# Patient Record
Sex: Male | Born: 1955
Health system: Southern US, Community
[De-identification: ages and names within clinical notes are randomized; demographics above are authoritative.]

## PROBLEM LIST (undated history)

## (undated) ENCOUNTER — Emergency Department (HOSPITAL_COMMUNITY): Admission: EM | Discharge status: Absent without leave | Payer: Medicare Other

## (undated) DIAGNOSIS — N186 End stage renal disease: Secondary | ICD-10-CM

## (undated) DIAGNOSIS — R0600 Dyspnea, unspecified: Secondary | ICD-10-CM

## (undated) DIAGNOSIS — J45909 Unspecified asthma, uncomplicated: Secondary | ICD-10-CM

## (undated) DIAGNOSIS — B20 Human immunodeficiency virus [HIV] disease: Secondary | ICD-10-CM

## (undated) DIAGNOSIS — G4730 Sleep apnea, unspecified: Secondary | ICD-10-CM

## (undated) DIAGNOSIS — E0590 Thyrotoxicosis, unspecified without thyrotoxic crisis or storm: Secondary | ICD-10-CM

## (undated) DIAGNOSIS — I350 Nonrheumatic aortic (valve) stenosis: Secondary | ICD-10-CM

## (undated) DIAGNOSIS — D649 Anemia, unspecified: Secondary | ICD-10-CM

## (undated) DIAGNOSIS — B181 Chronic viral hepatitis B without delta-agent: Secondary | ICD-10-CM

## (undated) DIAGNOSIS — I10 Essential (primary) hypertension: Secondary | ICD-10-CM

## (undated) DIAGNOSIS — J189 Pneumonia, unspecified organism: Secondary | ICD-10-CM

## (undated) DIAGNOSIS — F419 Anxiety disorder, unspecified: Secondary | ICD-10-CM

## (undated) DIAGNOSIS — R569 Unspecified convulsions: Secondary | ICD-10-CM

## (undated) DIAGNOSIS — M1990 Unspecified osteoarthritis, unspecified site: Secondary | ICD-10-CM

## (undated) DIAGNOSIS — D696 Thrombocytopenia, unspecified: Secondary | ICD-10-CM

## (undated) DIAGNOSIS — Z21 Asymptomatic human immunodeficiency virus [HIV] infection status: Secondary | ICD-10-CM

## (undated) HISTORY — PX: COLONOSCOPY: SHX174

## (undated) HISTORY — PX: OTHER SURGICAL HISTORY: SHX169

## (undated) HISTORY — DX: Essential (primary) hypertension: I10

## (undated) HISTORY — DX: Unspecified convulsions: R56.9

## (undated) HISTORY — DX: Anxiety disorder, unspecified: F41.9

## (undated) HISTORY — DX: Thrombocytopenia, unspecified: D69.6

## (undated) HISTORY — DX: Asymptomatic human immunodeficiency virus (hiv) infection status: Z21

## (undated) HISTORY — DX: Anemia, unspecified: D64.9

## (undated) HISTORY — PX: CORONARY ANGIOPLASTY: SHX604

## (undated) HISTORY — DX: Chronic viral hepatitis B without delta-agent: B18.1

## (undated) HISTORY — DX: Human immunodeficiency virus (HIV) disease: B20

---

## 2003-03-20 ENCOUNTER — Emergency Department (HOSPITAL_COMMUNITY): Admission: EM | Admit: 2003-03-20 | Discharge: 2003-03-21 | Payer: Self-pay | Admitting: Emergency Medicine

## 2004-02-20 ENCOUNTER — Emergency Department (HOSPITAL_COMMUNITY): Admission: EM | Admit: 2004-02-20 | Discharge: 2004-02-20 | Payer: Self-pay | Admitting: Emergency Medicine

## 2004-06-22 ENCOUNTER — Emergency Department (HOSPITAL_COMMUNITY): Admission: EM | Admit: 2004-06-22 | Discharge: 2004-06-22 | Payer: Self-pay | Admitting: Emergency Medicine

## 2004-08-07 ENCOUNTER — Emergency Department (HOSPITAL_COMMUNITY): Admission: EM | Admit: 2004-08-07 | Discharge: 2004-08-08 | Payer: Self-pay | Admitting: Emergency Medicine

## 2005-08-14 IMAGING — CR DG CHEST 2V
2 series · 2 of 2 positions shown · non-contrast
Comparison: none

CLINICAL DATA: Shortness of breath. 

 TWO VIEW CHEST
 The heart is enlarged with normal vascularity.  The aorta is tortuous.  Chronic bronchitic changes are seen.  There is no infiltrate or effusion.  
 IMPRESSION
 Cardiomegaly with no congestive failure. 
 Chronic bronchitis. 
 [REDACTED]

[view not recorded (1 of 2)]
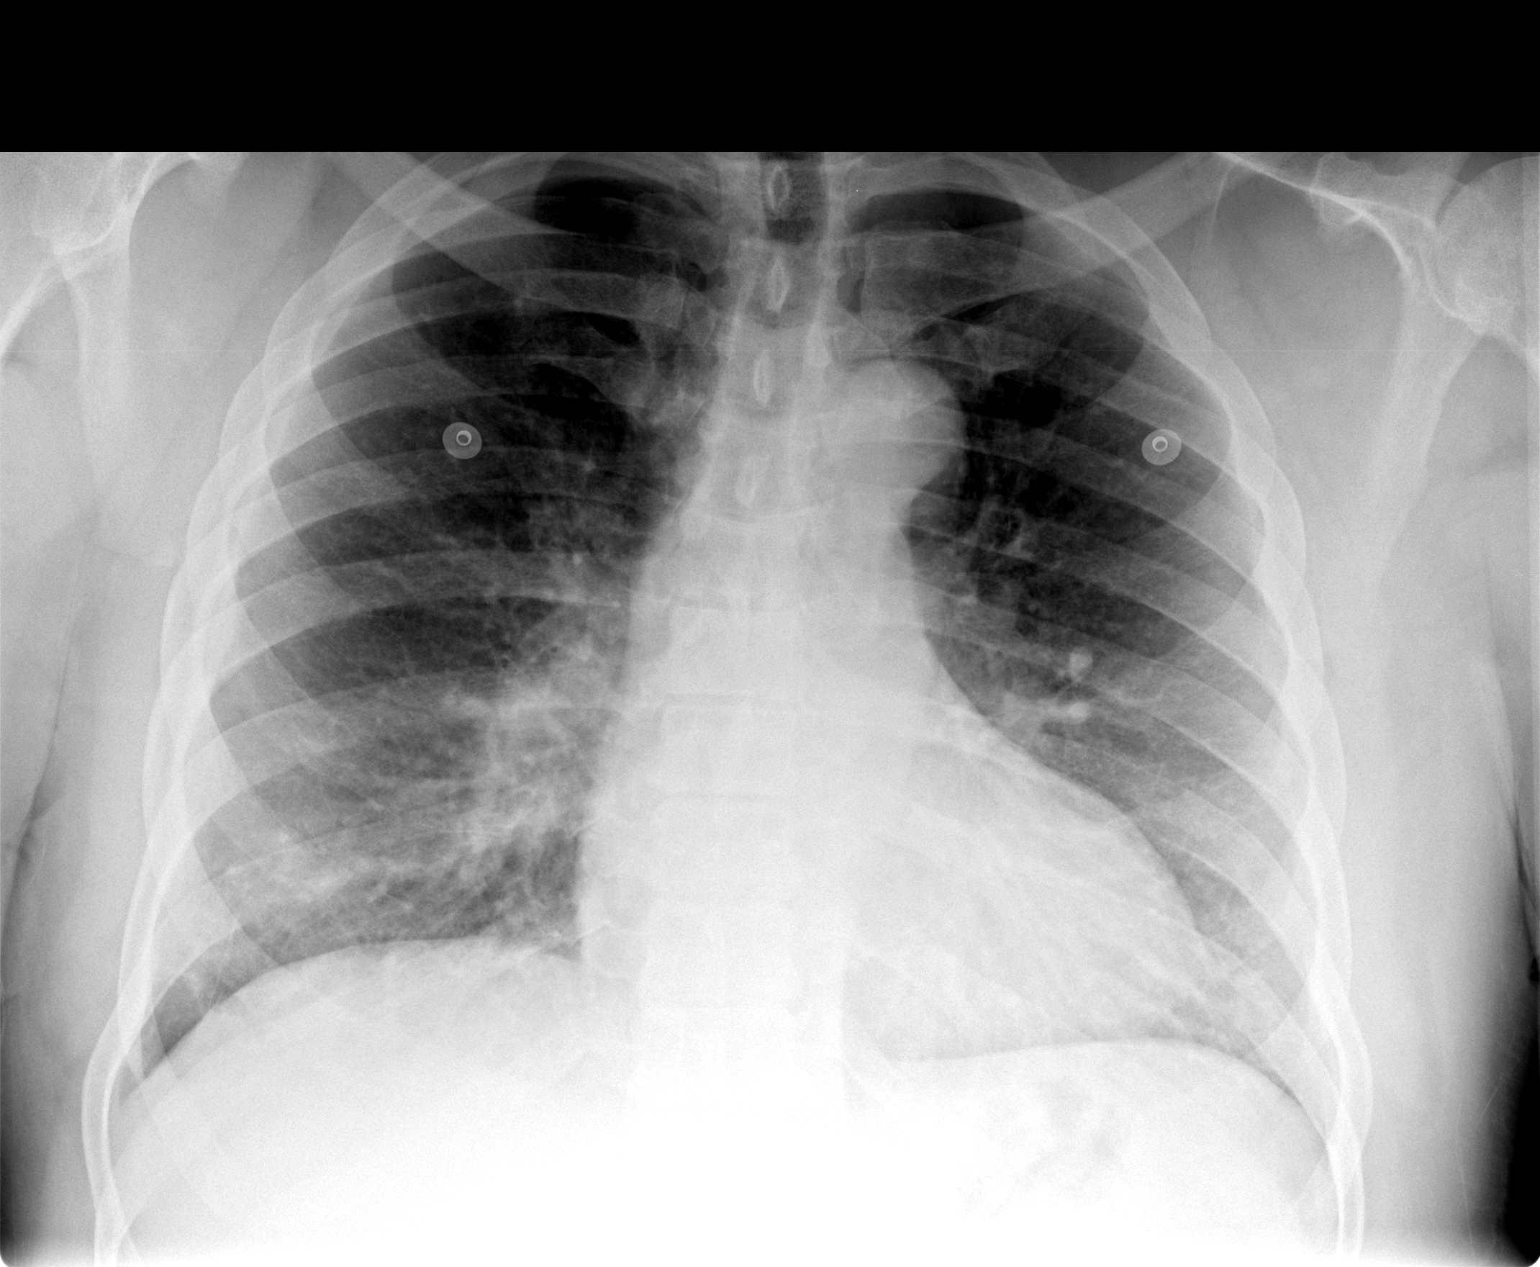

[view not recorded (2 of 2)]
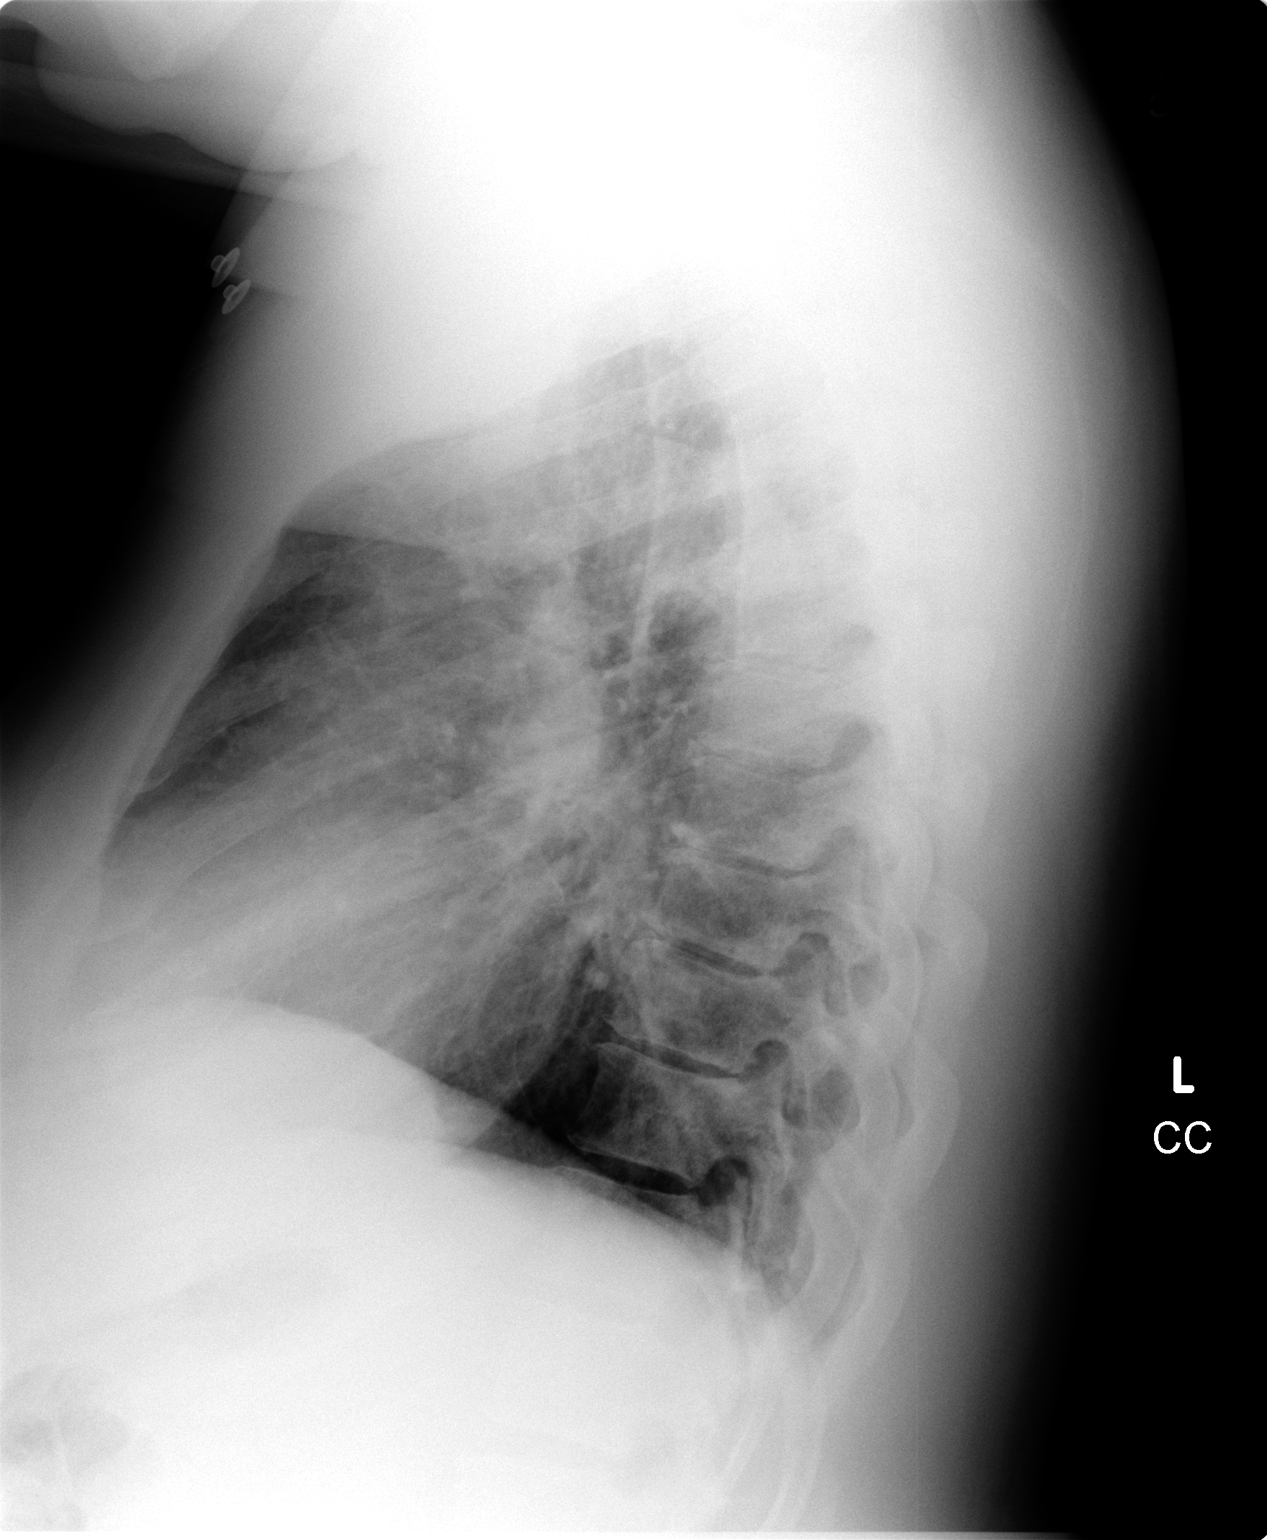

[2 of 2 positions shown; findings below may reference images not displayed]

## 2005-11-03 ENCOUNTER — Emergency Department (HOSPITAL_COMMUNITY): Admission: EM | Admit: 2005-11-03 | Discharge: 2005-11-03 | Payer: Self-pay | Admitting: Emergency Medicine

## 2005-11-05 ENCOUNTER — Emergency Department (HOSPITAL_COMMUNITY): Admission: EM | Admit: 2005-11-05 | Discharge: 2005-11-05 | Payer: Self-pay | Admitting: Family Medicine

## 2005-12-31 ENCOUNTER — Emergency Department (HOSPITAL_COMMUNITY): Admission: EM | Admit: 2005-12-31 | Discharge: 2005-12-31 | Payer: Self-pay | Admitting: Family Medicine

## 2006-02-21 ENCOUNTER — Emergency Department (HOSPITAL_COMMUNITY): Admission: EM | Admit: 2006-02-21 | Discharge: 2006-02-21 | Payer: Self-pay | Admitting: Emergency Medicine

## 2006-03-14 ENCOUNTER — Emergency Department (HOSPITAL_COMMUNITY): Admission: EM | Admit: 2006-03-14 | Discharge: 2006-03-14 | Payer: Self-pay | Admitting: Family Medicine

## 2006-04-14 ENCOUNTER — Emergency Department (HOSPITAL_COMMUNITY): Admission: EM | Admit: 2006-04-14 | Discharge: 2006-04-14 | Payer: Self-pay | Admitting: Family Medicine

## 2006-08-07 ENCOUNTER — Emergency Department (HOSPITAL_COMMUNITY): Admission: EM | Admit: 2006-08-07 | Discharge: 2006-08-07 | Payer: Self-pay | Admitting: Emergency Medicine

## 2006-09-21 ENCOUNTER — Emergency Department (HOSPITAL_COMMUNITY): Admission: EM | Admit: 2006-09-21 | Discharge: 2006-09-21 | Payer: Self-pay | Admitting: Emergency Medicine

## 2006-11-03 ENCOUNTER — Emergency Department (HOSPITAL_COMMUNITY): Admission: EM | Admit: 2006-11-03 | Discharge: 2006-11-03 | Payer: Self-pay | Admitting: Emergency Medicine

## 2006-11-28 ENCOUNTER — Emergency Department (HOSPITAL_COMMUNITY): Admission: EM | Admit: 2006-11-28 | Discharge: 2006-11-28 | Payer: Self-pay | Admitting: Emergency Medicine

## 2007-03-28 ENCOUNTER — Emergency Department (HOSPITAL_COMMUNITY): Admission: EM | Admit: 2007-03-28 | Discharge: 2007-03-28 | Payer: Self-pay | Admitting: Family Medicine

## 2007-08-10 ENCOUNTER — Emergency Department (HOSPITAL_COMMUNITY): Admission: EM | Admit: 2007-08-10 | Discharge: 2007-08-10 | Payer: Self-pay | Admitting: Emergency Medicine

## 2007-10-04 ENCOUNTER — Emergency Department (HOSPITAL_COMMUNITY): Admission: EM | Admit: 2007-10-04 | Discharge: 2007-10-04 | Payer: Self-pay | Admitting: Family Medicine

## 2007-12-25 ENCOUNTER — Emergency Department (HOSPITAL_COMMUNITY): Admission: EM | Admit: 2007-12-25 | Discharge: 2007-12-25 | Payer: Self-pay | Admitting: Emergency Medicine

## 2008-01-08 ENCOUNTER — Emergency Department (HOSPITAL_COMMUNITY): Admission: EM | Admit: 2008-01-08 | Discharge: 2008-01-08 | Payer: Self-pay | Admitting: Emergency Medicine

## 2008-03-15 IMAGING — CT CT HEAD W/O CM
1 of 2 series · 13 of 30 positions shown, 17 images · IV contrast (agent unspecified)
Comparison: None

CLINICAL DATA: Headache

HEAD CT WITHOUT CONTRAST:
TECHNIQUE: 5mm collimated images were obtained from the base of the skull
through the vertex according to standard protocol without contrast.

[Series 2: brain · axial · 0.49mm/px · z∈[+161,+296]mm · 13 of 32 slices shown, 17 images]
[im 3/32  brain]
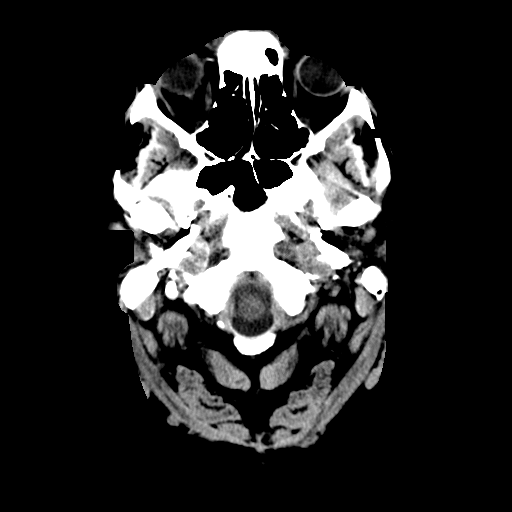
[im 3/32  bone]
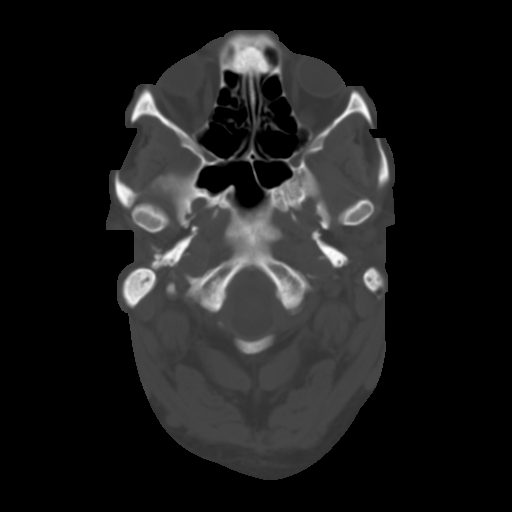
[im 5/32  brain]
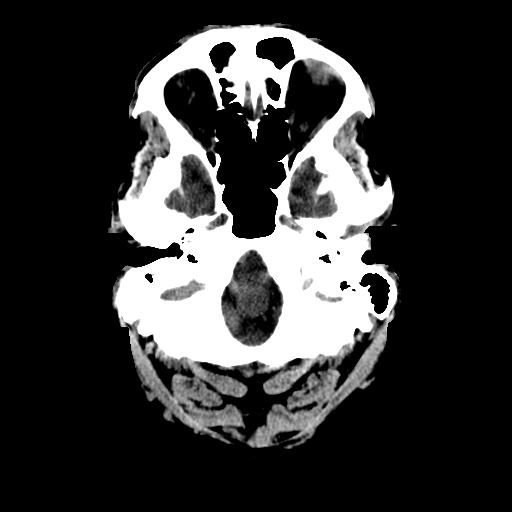
[im 7/32  brain]
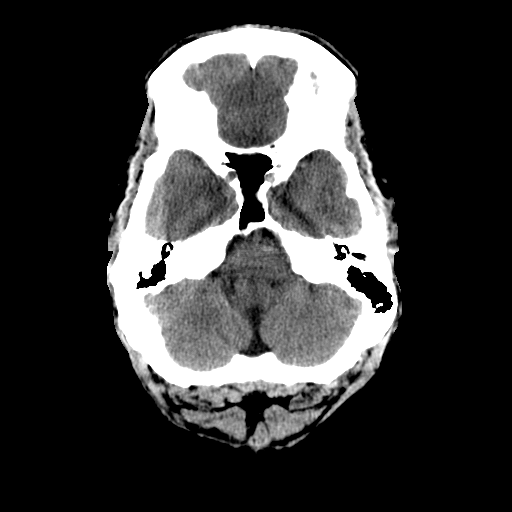
[im 9/32  brain]
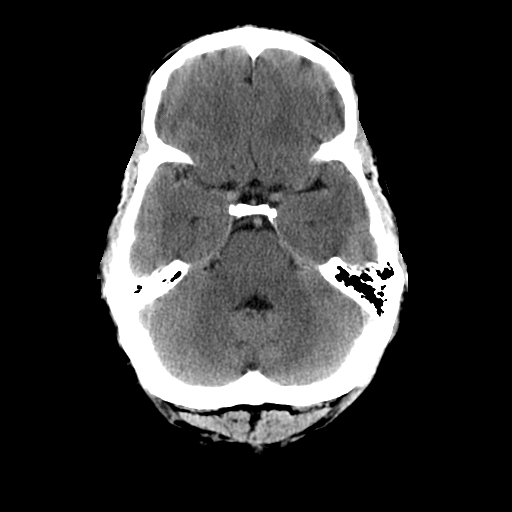
[im 12/32  brain]
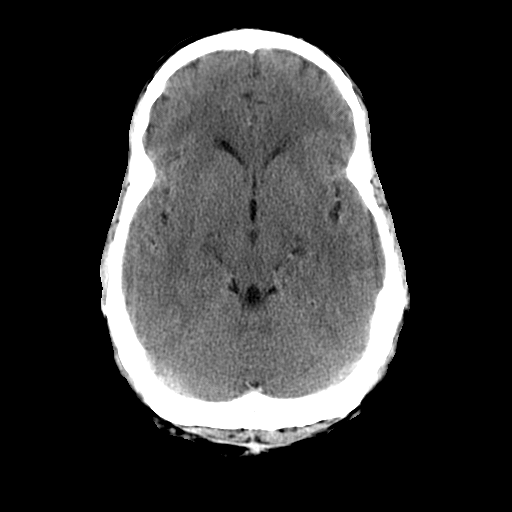
[im 12/32  bone]
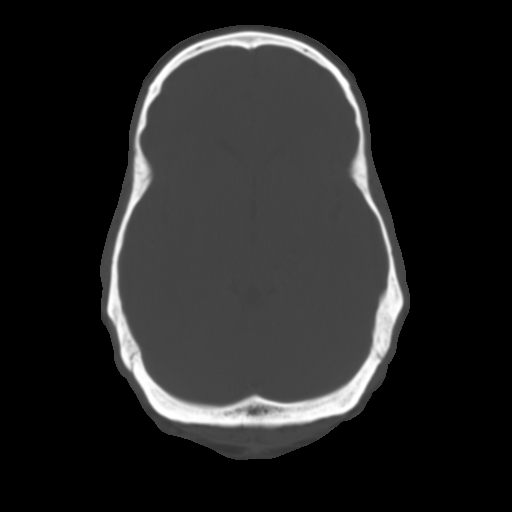
[im 14/32  brain]
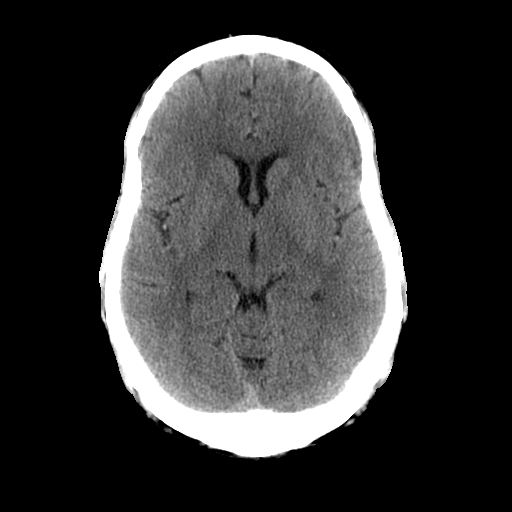
[im 16/32  brain]
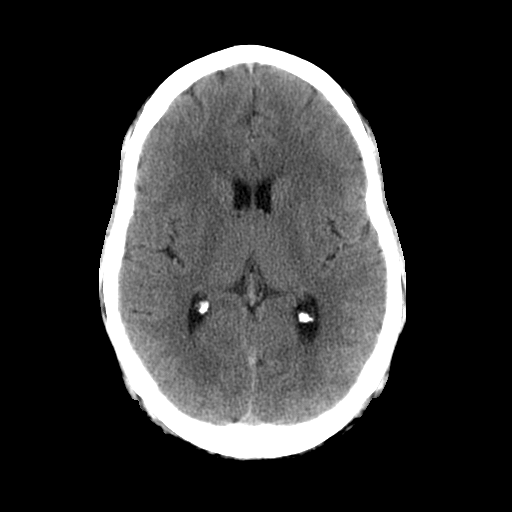
[im 18/32  brain]
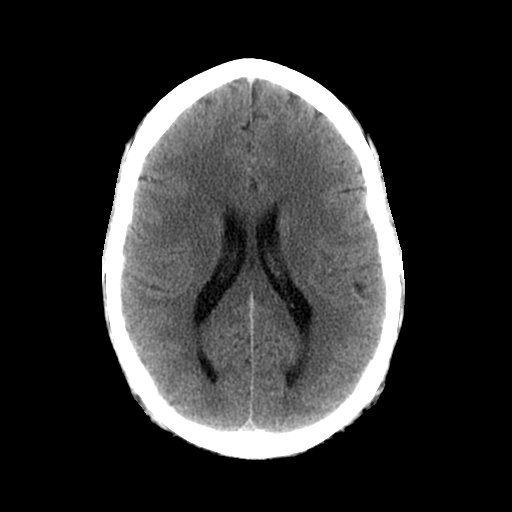
[im 20/32  brain]
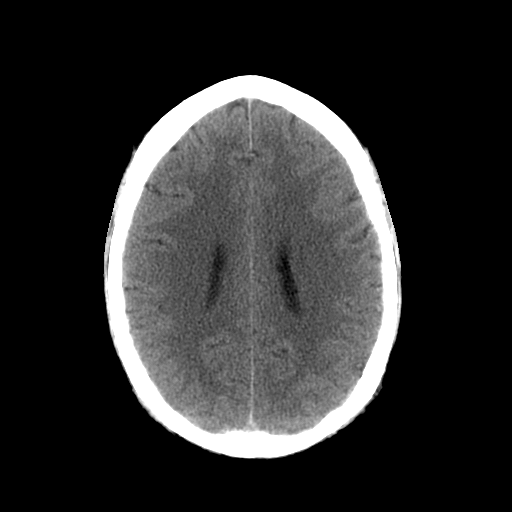
[im 20/32  bone]
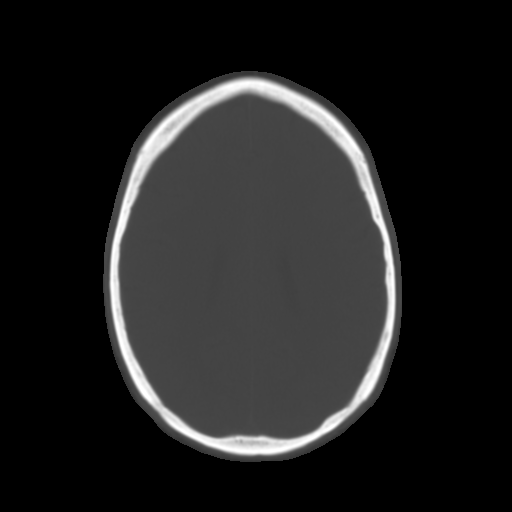
[im 23/32  brain]
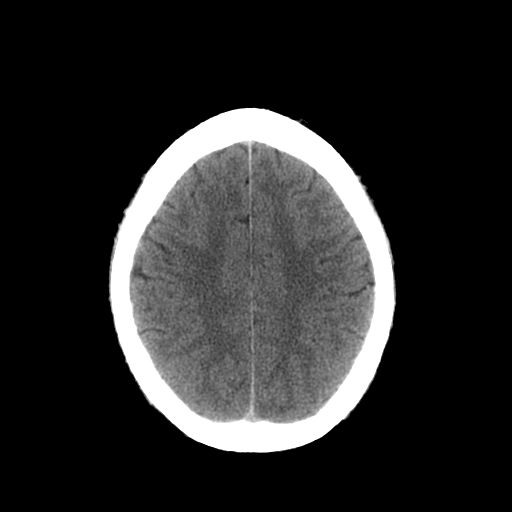
[im 25/32  brain]
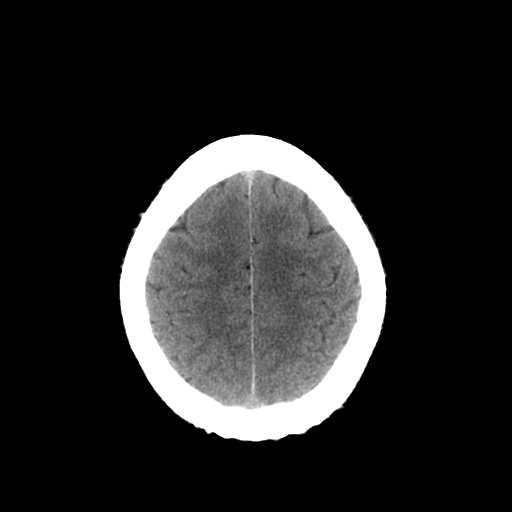
[im 27/32  brain]
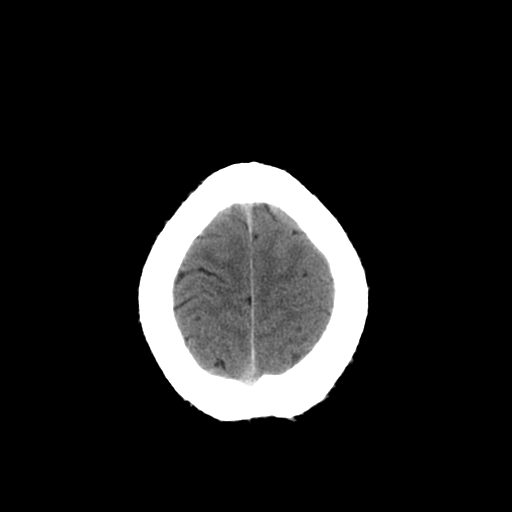
[im 29/32  brain]
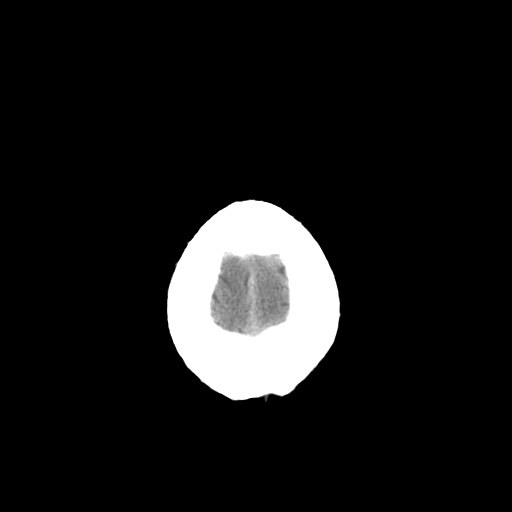
[im 29/32  bone]
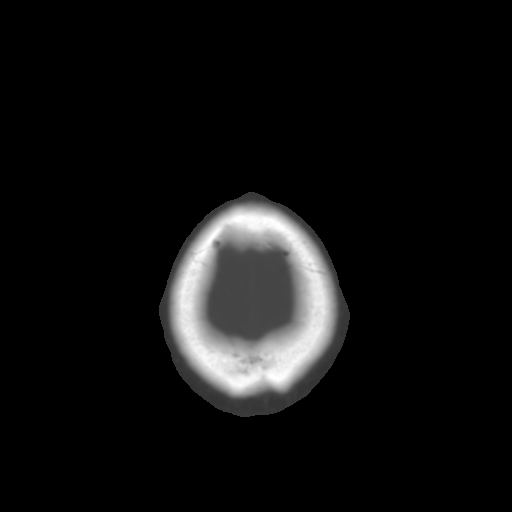

[13 of 30 positions shown; findings below may reference images not displayed]

FINDINGS: There is no evidence of intracranial hemorrhage, hydrocephalus, mass
lesion, or acute infarction.  No abnormal extra-axial fluid collections
identified.  No skull abnormalities are noted.
IMPRESSION: No acute intracranial abnormality.

## 2008-04-27 IMAGING — CR DG CHEST 2V
2 series · 2 of 2 positions shown · non-contrast
Comparison: 02/20/04.

CLINICAL DATA: Fever and shortness of breath.  
 CHEST - 2 VIEW:

[view not recorded (1 of 2)]
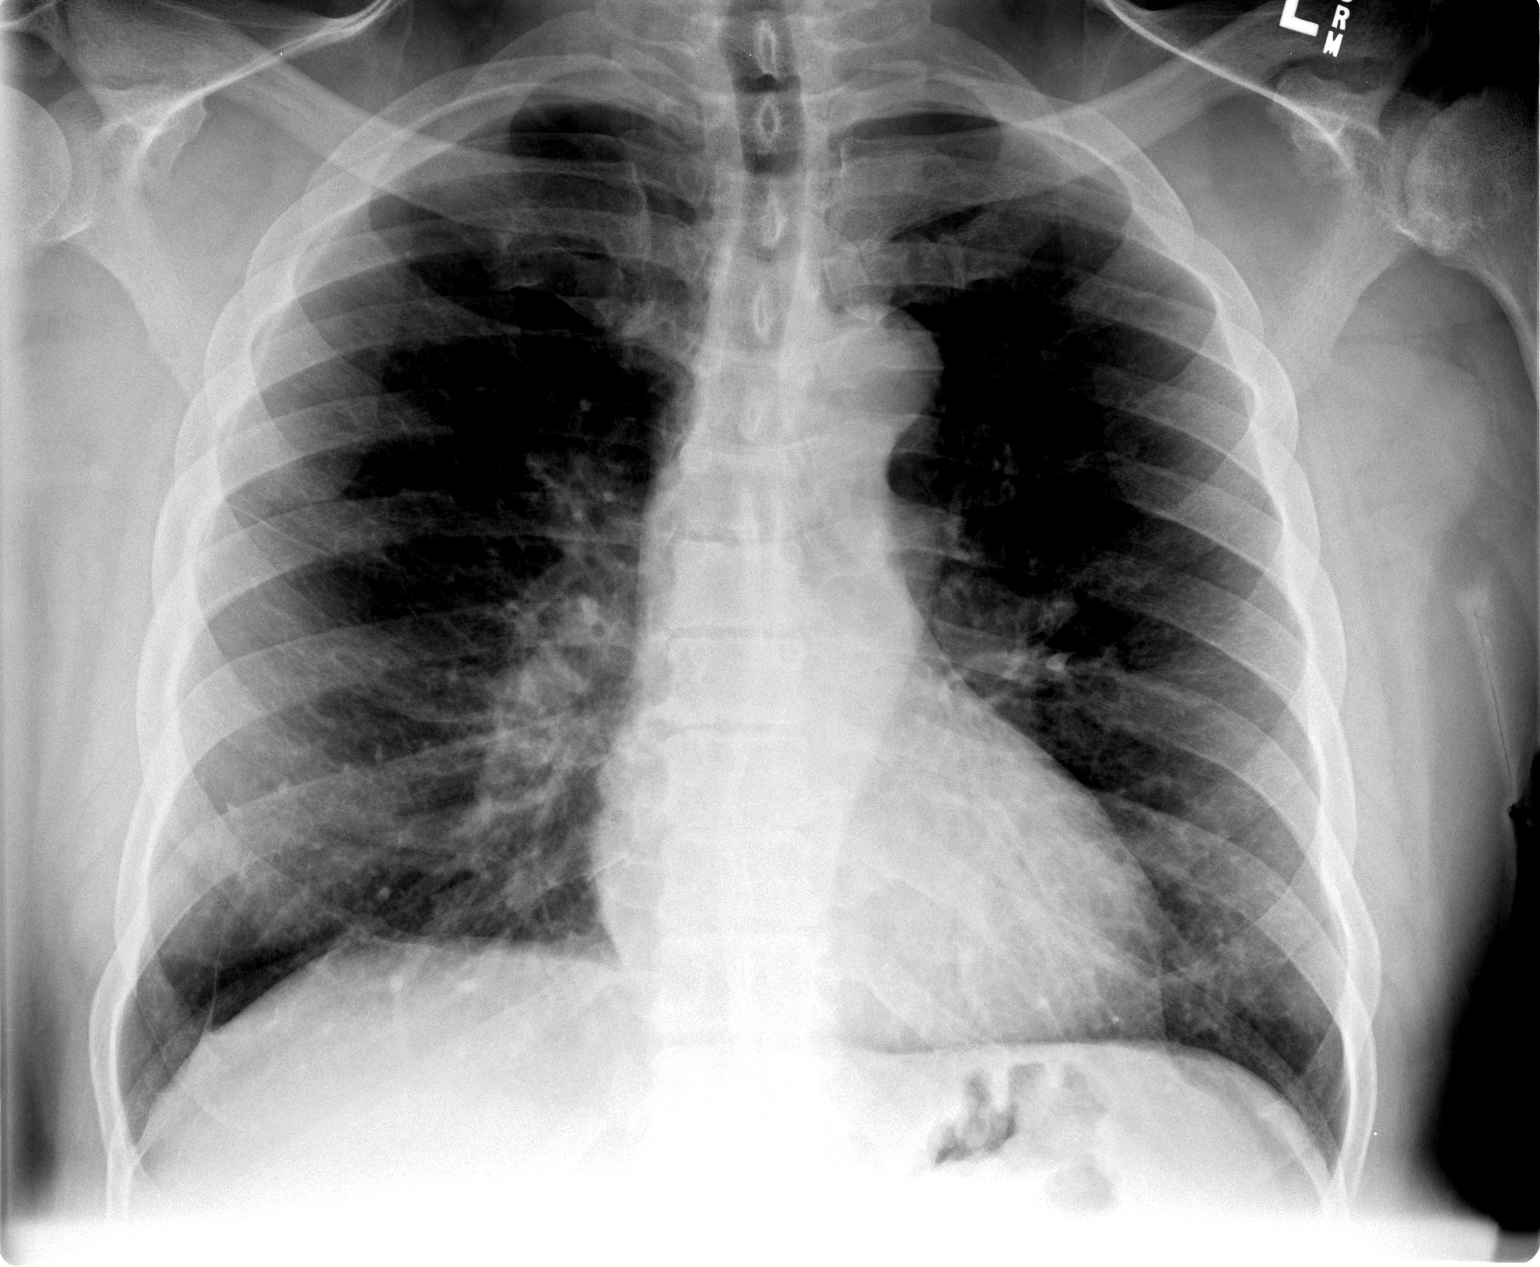

[view not recorded (2 of 2)]
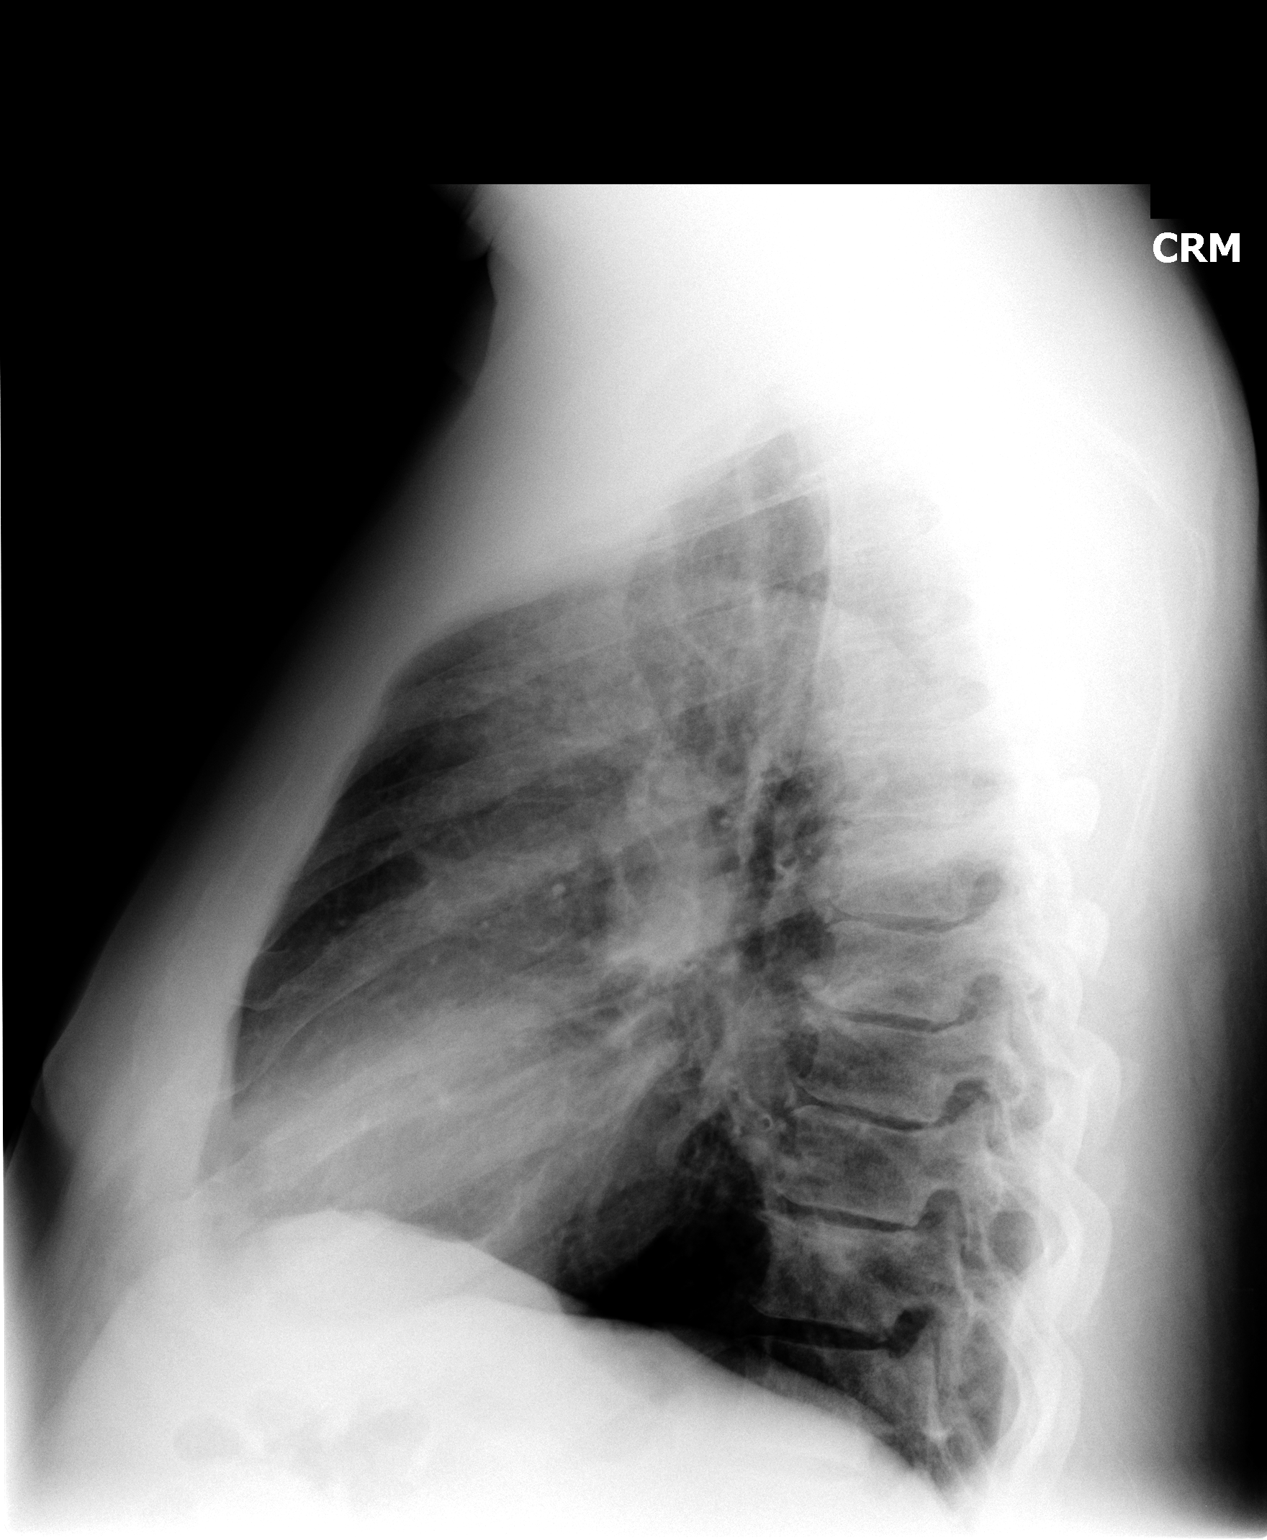

[2 of 2 positions shown; findings below may reference images not displayed]

FINDINGS: Chest is hyperexpanded with peribronchial thickening.  No focal airspace disease or effusion.  Heart size normal.
IMPRESSION: Findings compatible with bronchitis and likely emphysema.  Negative for pneumonia.

## 2008-09-05 ENCOUNTER — Emergency Department (HOSPITAL_COMMUNITY): Admission: EM | Admit: 2008-09-05 | Discharge: 2008-09-05 | Payer: Self-pay | Admitting: Emergency Medicine

## 2008-10-06 ENCOUNTER — Emergency Department (HOSPITAL_COMMUNITY): Admission: EM | Admit: 2008-10-06 | Discharge: 2008-10-06 | Payer: Self-pay | Admitting: Emergency Medicine

## 2009-03-28 IMAGING — CR DG FOOT COMPLETE 3+V*L*
3 series · 3 of 3 positions shown · non-contrast
Comparison: none

CLINICAL DATA: Left foot pain.  
 LEFT FOOT ? 3 VIEW:

[view not recorded (1 of 3)]
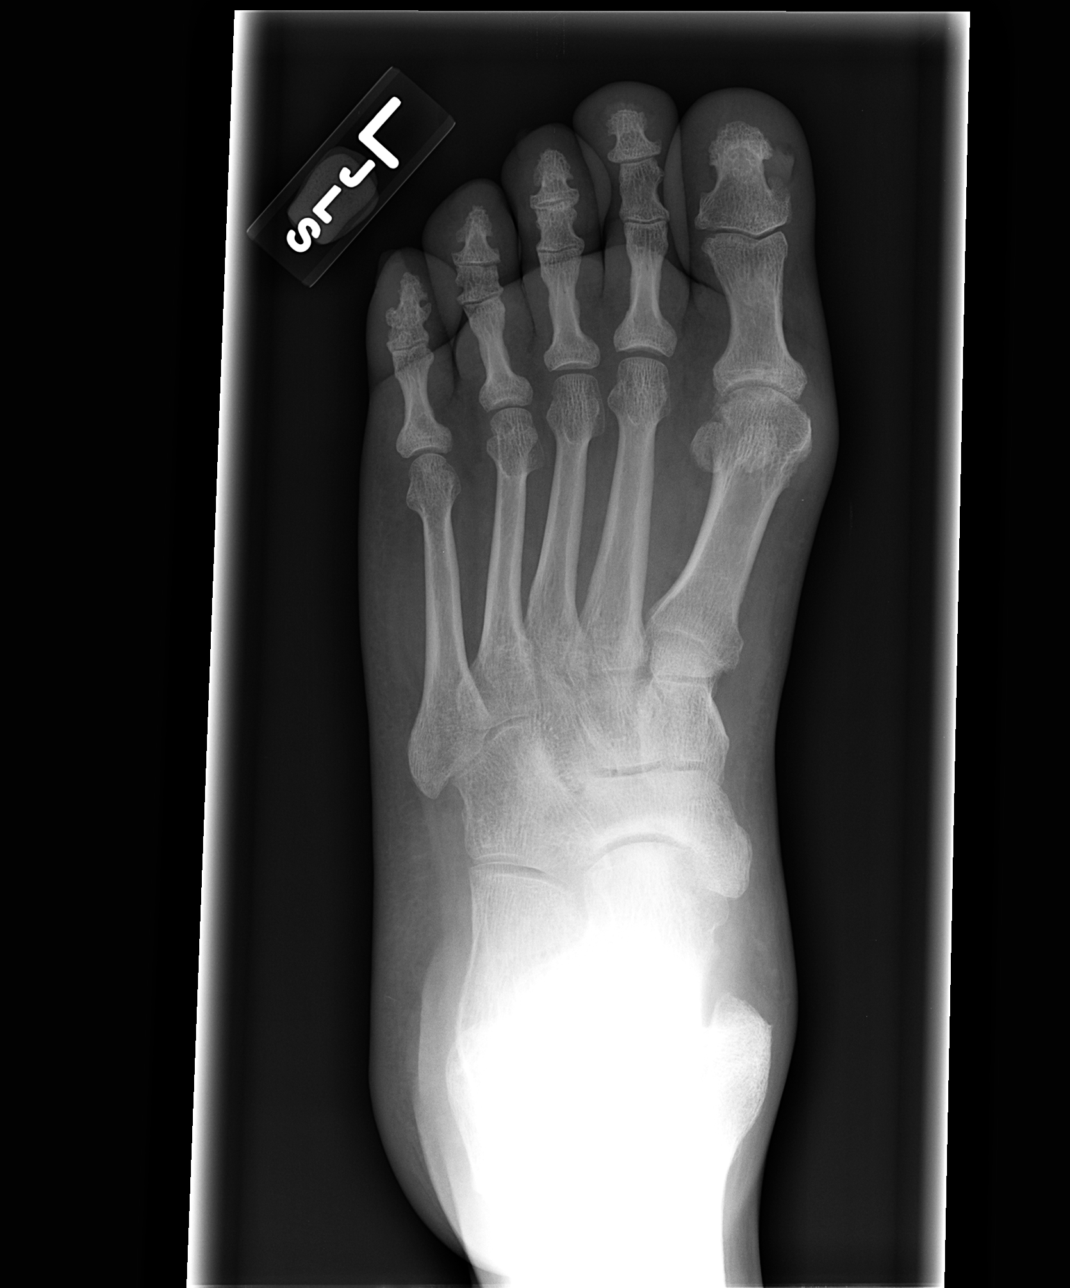

[view not recorded (2 of 3)]
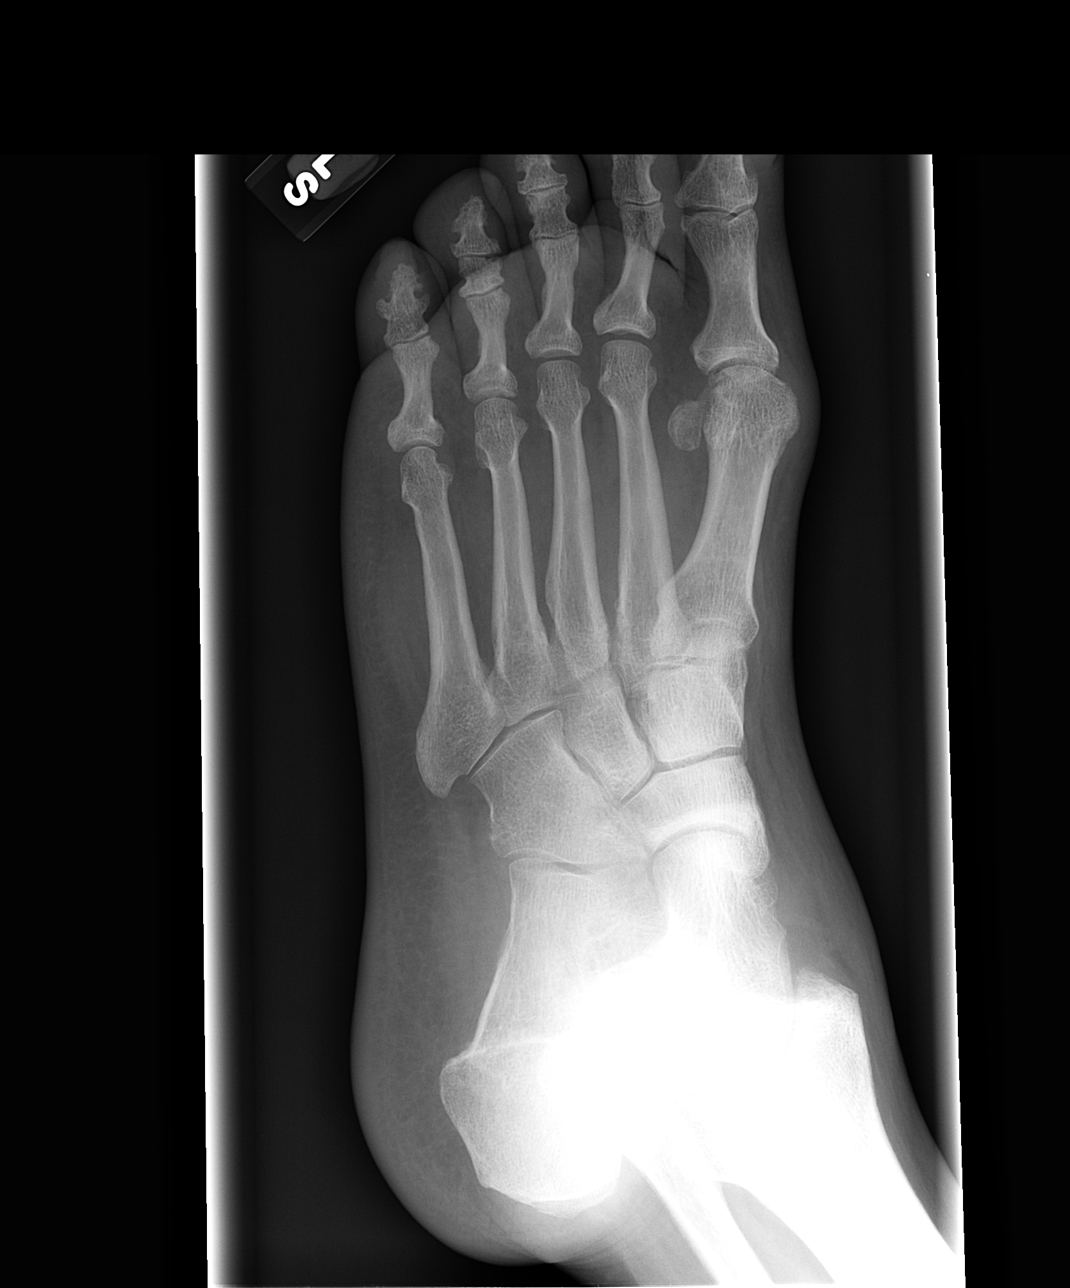

[view not recorded (3 of 3)]
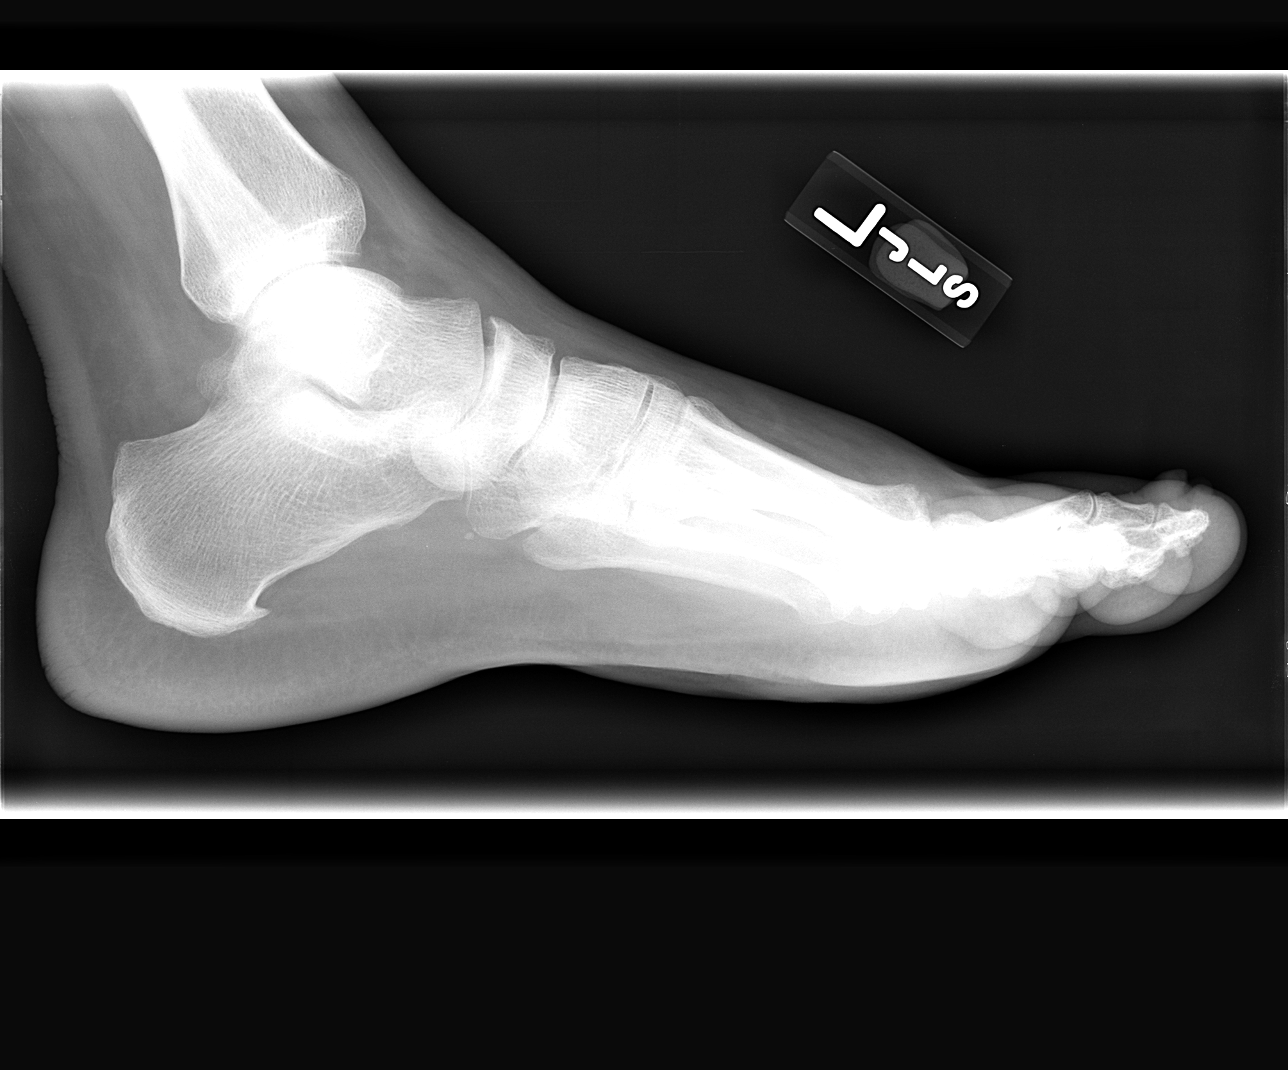

[3 of 3 positions shown; findings below may reference images not displayed]

FINDINGS: Degenerative changes and mild hallus valgus at the first metatarsal phalangeal joint are present.  Mild spurring is noted at the inferior calcaneus. This is the origin of the flexor aponeurosis.  Mild degenerative changes are seen at the talonavicular articulation.  Mild degenerative changes in the digits are present.  No destructive bone lesions.  No acute fractures.
IMPRESSION: Degenerative change.  Inferior calcaneal spur.  No acute bony pathology.

## 2009-04-02 ENCOUNTER — Emergency Department (HOSPITAL_COMMUNITY): Admission: EM | Admit: 2009-04-02 | Discharge: 2009-04-02 | Payer: Self-pay | Admitting: Emergency Medicine

## 2009-08-23 ENCOUNTER — Emergency Department (HOSPITAL_COMMUNITY): Admission: EM | Admit: 2009-08-23 | Discharge: 2009-08-23 | Payer: Self-pay | Admitting: Emergency Medicine

## 2009-10-06 ENCOUNTER — Emergency Department (HOSPITAL_COMMUNITY): Admission: EM | Admit: 2009-10-06 | Discharge: 2009-10-06 | Payer: Self-pay | Admitting: Emergency Medicine

## 2010-06-16 ENCOUNTER — Emergency Department (HOSPITAL_COMMUNITY): Admission: EM | Admit: 2010-06-16 | Discharge: 2010-06-16 | Payer: Self-pay | Admitting: Emergency Medicine

## 2010-07-25 ENCOUNTER — Emergency Department (HOSPITAL_COMMUNITY)
Admission: EM | Admit: 2010-07-25 | Discharge: 2010-07-25 | Payer: Self-pay | Source: Home / Self Care | Admitting: Emergency Medicine

## 2010-07-25 ENCOUNTER — Emergency Department (HOSPITAL_COMMUNITY)
Admission: EM | Admit: 2010-07-25 | Discharge: 2010-07-25 | Payer: Self-pay | Source: Home / Self Care | Admitting: Family Medicine

## 2010-10-10 ENCOUNTER — Emergency Department (HOSPITAL_COMMUNITY)
Admission: EM | Admit: 2010-10-10 | Discharge: 2010-10-10 | Payer: Self-pay | Source: Home / Self Care | Admitting: Family Medicine

## 2010-12-02 LAB — WOUND CULTURE

## 2011-01-05 LAB — POCT URINALYSIS DIP (DEVICE)
Protein, ur: >=300 mg/dL — AB
Specific Gravity, Urine: 1.02 (ref 1.005–1.030)
Urobilinogen, UA: 1 mg/dL (ref 0.0–1.0)
pH: 5 (ref 5.0–8.0)

## 2011-01-05 LAB — URINE CULTURE: Colony Count: >=100000

## 2011-02-18 ENCOUNTER — Emergency Department (HOSPITAL_COMMUNITY): Payer: Self-pay

## 2011-02-18 ENCOUNTER — Emergency Department (HOSPITAL_COMMUNITY)
Admission: EM | Admit: 2011-02-18 | Discharge: 2011-02-18 | Disposition: A | Discharge status: Home / Self Care | Payer: Self-pay | Attending: Emergency Medicine | Admitting: Emergency Medicine

## 2011-02-18 ENCOUNTER — Inpatient Hospital Stay (INDEPENDENT_AMBULATORY_CARE_PROVIDER_SITE_OTHER)
Admission: RE | Admit: 2011-02-18 | Discharge: 2011-02-18 | Disposition: A | Discharge status: Home / Self Care | Payer: Self-pay | Source: Ambulatory Visit | Attending: Emergency Medicine | Admitting: Emergency Medicine

## 2011-02-18 DIAGNOSIS — I10 Essential (primary) hypertension: Secondary | ICD-10-CM | POA: Insufficient documentation

## 2011-02-18 DIAGNOSIS — R1013 Epigastric pain: Secondary | ICD-10-CM | POA: Insufficient documentation

## 2011-02-18 DIAGNOSIS — Z862 Personal history of diseases of the blood and blood-forming organs and certain disorders involving the immune mechanism: Secondary | ICD-10-CM | POA: Insufficient documentation

## 2011-02-18 DIAGNOSIS — R079 Chest pain, unspecified: Secondary | ICD-10-CM | POA: Insufficient documentation

## 2011-02-18 DIAGNOSIS — M25569 Pain in unspecified knee: Secondary | ICD-10-CM | POA: Insufficient documentation

## 2011-02-18 DIAGNOSIS — R109 Unspecified abdominal pain: Secondary | ICD-10-CM

## 2011-02-18 DIAGNOSIS — Z79899 Other long term (current) drug therapy: Secondary | ICD-10-CM | POA: Insufficient documentation

## 2011-02-18 DIAGNOSIS — Z8639 Personal history of other endocrine, nutritional and metabolic disease: Secondary | ICD-10-CM | POA: Insufficient documentation

## 2011-05-26 ENCOUNTER — Inpatient Hospital Stay (INDEPENDENT_AMBULATORY_CARE_PROVIDER_SITE_OTHER)
Admission: RE | Admit: 2011-05-26 | Discharge: 2011-05-26 | Disposition: A | Discharge status: Home / Self Care | Payer: Self-pay | Source: Ambulatory Visit | Attending: Emergency Medicine | Admitting: Emergency Medicine

## 2011-05-26 DIAGNOSIS — M109 Gout, unspecified: Secondary | ICD-10-CM

## 2011-06-02 ENCOUNTER — Emergency Department (HOSPITAL_COMMUNITY): Payer: Medicaid Other

## 2011-06-02 ENCOUNTER — Inpatient Hospital Stay (HOSPITAL_COMMUNITY)
Admission: EM | Admit: 2011-06-02 | Discharge: 2011-06-16 | DRG: 673 | Disposition: A | Discharge status: Home / Self Care | Payer: Medicaid Other | Attending: Internal Medicine | Admitting: Internal Medicine

## 2011-06-02 ENCOUNTER — Other Ambulatory Visit (HOSPITAL_COMMUNITY): Payer: Self-pay

## 2011-06-02 DIAGNOSIS — Z23 Encounter for immunization: Secondary | ICD-10-CM

## 2011-06-02 DIAGNOSIS — I120 Hypertensive chronic kidney disease with stage 5 chronic kidney disease or end stage renal disease: Secondary | ICD-10-CM | POA: Diagnosis present

## 2011-06-02 DIAGNOSIS — R569 Unspecified convulsions: Secondary | ICD-10-CM

## 2011-06-02 DIAGNOSIS — F411 Generalized anxiety disorder: Secondary | ICD-10-CM | POA: Diagnosis present

## 2011-06-02 DIAGNOSIS — N186 End stage renal disease: Secondary | ICD-10-CM | POA: Diagnosis present

## 2011-06-02 DIAGNOSIS — D62 Acute posthemorrhagic anemia: Secondary | ICD-10-CM | POA: Diagnosis present

## 2011-06-02 DIAGNOSIS — D696 Thrombocytopenia, unspecified: Secondary | ICD-10-CM | POA: Diagnosis present

## 2011-06-02 DIAGNOSIS — F17200 Nicotine dependence, unspecified, uncomplicated: Secondary | ICD-10-CM | POA: Diagnosis present

## 2011-06-02 DIAGNOSIS — B20 Human immunodeficiency virus [HIV] disease: Secondary | ICD-10-CM | POA: Diagnosis present

## 2011-06-02 DIAGNOSIS — B1910 Unspecified viral hepatitis B without hepatic coma: Secondary | ICD-10-CM | POA: Diagnosis present

## 2011-06-02 DIAGNOSIS — Z79899 Other long term (current) drug therapy: Secondary | ICD-10-CM

## 2011-06-02 DIAGNOSIS — G9341 Metabolic encephalopathy: Secondary | ICD-10-CM | POA: Diagnosis present

## 2011-06-02 DIAGNOSIS — E875 Hyperkalemia: Secondary | ICD-10-CM | POA: Diagnosis present

## 2011-06-02 DIAGNOSIS — M109 Gout, unspecified: Secondary | ICD-10-CM | POA: Diagnosis present

## 2011-06-02 DIAGNOSIS — B961 Klebsiella pneumoniae [K. pneumoniae] as the cause of diseases classified elsewhere: Secondary | ICD-10-CM | POA: Diagnosis not present

## 2011-06-02 DIAGNOSIS — N179 Acute kidney failure, unspecified: Principal | ICD-10-CM | POA: Diagnosis present

## 2011-06-02 DIAGNOSIS — N390 Urinary tract infection, site not specified: Secondary | ICD-10-CM | POA: Diagnosis not present

## 2011-06-02 DIAGNOSIS — N2581 Secondary hyperparathyroidism of renal origin: Secondary | ICD-10-CM | POA: Diagnosis present

## 2011-06-02 HISTORY — DX: Unspecified convulsions: R56.9

## 2011-06-02 HISTORY — PX: AV FISTULA PLACEMENT: SHX1204

## 2011-06-02 LAB — CARDIAC PANEL(CRET KIN+CKTOT+MB+TROPI)
Relative Index: 0.3 (ref 0.0–2.5)
Total CK: 1739 U/L — ABNORMAL HIGH (ref 7–232)

## 2011-06-02 LAB — COMPREHENSIVE METABOLIC PANEL
ALT: 10 U/L (ref 0–53)
AST: 22 U/L (ref 0–37)
Albumin: 2.9 g/dL — ABNORMAL LOW (ref 3.5–5.2)
Alkaline Phosphatase: 92 U/L (ref 39–117)
BUN: 118 mg/dL — ABNORMAL HIGH (ref 6–23)
CO2: 9 mEq/L — CL (ref 19–32)
CO2: 15 mEq/L — ABNORMAL LOW (ref 19–32)
Calcium: 4.4 mg/dL — CL (ref 8.4–10.5)
Chloride: 100 mEq/L (ref 96–112)
Chloride: 104 mEq/L (ref 96–112)
Creatinine, Ser: 23.21 mg/dL — ABNORMAL HIGH (ref 0.50–1.35)
Creatinine, Ser: 23.57 mg/dL — ABNORMAL HIGH (ref 0.50–1.35)
GFR calc Af Amer: 2 mL/min — ABNORMAL LOW (ref >60)
GFR calc non Af Amer: 2 mL/min — ABNORMAL LOW (ref >60)
GFR calc non Af Amer: 2 mL/min — ABNORMAL LOW (ref >60)
Glucose, Bld: 127 mg/dL — ABNORMAL HIGH (ref 70–99)
Glucose, Bld: 68 mg/dL — ABNORMAL LOW (ref 70–99)
Potassium: 4.1 mEq/L (ref 3.5–5.1)
Sodium: 139 mEq/L (ref 135–145)
Sodium: 142 mEq/L (ref 135–145)
Total Bilirubin: 0.1 mg/dL — ABNORMAL LOW (ref 0.3–1.2)
Total Protein: 9.6 g/dL — ABNORMAL HIGH (ref 6.0–8.3)

## 2011-06-02 LAB — URINALYSIS, MICROSCOPIC ONLY
Glucose, UA: NEGATIVE mg/dL
Leukocytes, UA: NEGATIVE
Urobilinogen, UA: 0.2 mg/dL (ref 0.0–1.0)

## 2011-06-02 LAB — RAPID URINE DRUG SCREEN, HOSP PERFORMED
Amphetamines: NOT DETECTED
Barbiturates: NOT DETECTED
Benzodiazepines: NOT DETECTED
Cocaine: POSITIVE — AB
Tetrahydrocannabinol: NOT DETECTED

## 2011-06-02 LAB — DIFFERENTIAL
Basophils Absolute: 0.1 10*3/uL (ref 0.0–0.1)
Basophils Relative: 1 % (ref 0–1)
Eosinophils Absolute: 0.3 10*3/uL (ref 0.0–0.7)
Eosinophils Relative: 3 % (ref 0–5)
Lymphocytes Relative: 31 % (ref 12–46)
Lymphs Abs: 3 10*3/uL (ref 0.7–4.0)
Monocytes Absolute: 0.6 10*3/uL (ref 0.1–1.0)
Monocytes Relative: 6 % (ref 3–12)
Neutro Abs: 5.7 10*3/uL (ref 1.7–7.7)

## 2011-06-02 LAB — LACTATE DEHYDROGENASE: LDH: 766 U/L — ABNORMAL HIGH (ref 94–250)

## 2011-06-02 LAB — CK TOTAL AND CKMB (NOT AT ARMC)
CK, MB: 5 ng/mL — ABNORMAL HIGH (ref 0.3–4.0)
Relative Index: 0.3 (ref 0.0–2.5)
Total CK: 1816 U/L — ABNORMAL HIGH (ref 7–232)

## 2011-06-02 LAB — IRON AND TIBC
Iron: 100 ug/dL (ref 42–135)
Iron: 72 ug/dL (ref 42–135)
Saturation Ratios: 53 % (ref 20–55)
TIBC: 189 ug/dL — ABNORMAL LOW (ref 215–435)
TIBC: 166 ug/dL — ABNORMAL LOW (ref 215–435)
UIBC: 89 ug/dL — ABNORMAL LOW (ref 125–400)
UIBC: 94 ug/dL — ABNORMAL LOW (ref 125–400)

## 2011-06-02 LAB — PROTIME-INR
INR: 1.34 (ref 0.00–1.49)
Prothrombin Time: 16.8 seconds — ABNORMAL HIGH (ref 11.6–15.2)

## 2011-06-02 LAB — CBC
Platelets: 113 10*3/uL — ABNORMAL LOW (ref 150–400)
RBC: 2.39 MIL/uL — ABNORMAL LOW (ref 4.22–5.81)
WBC: 9.7 10*3/uL (ref 4.0–10.5)

## 2011-06-02 LAB — SEDIMENTATION RATE: Sed Rate: >140 mm/hr — ABNORMAL HIGH (ref 0–16)

## 2011-06-02 LAB — URIC ACID: Uric Acid, Serum: 5 mg/dL (ref 4.0–7.8)

## 2011-06-02 LAB — TROPONIN I: Troponin I: <0.3 ng/mL (ref <0.30)

## 2011-06-02 LAB — PHOSPHORUS: Phosphorus: 11.2 mg/dL — ABNORMAL HIGH (ref 2.3–4.6)

## 2011-06-02 LAB — FOLATE: Folate: 15.7 ng/mL

## 2011-06-03 ENCOUNTER — Inpatient Hospital Stay (HOSPITAL_COMMUNITY): Payer: Medicaid Other

## 2011-06-03 ENCOUNTER — Other Ambulatory Visit (HOSPITAL_COMMUNITY): Payer: Self-pay

## 2011-06-03 DIAGNOSIS — N186 End stage renal disease: Secondary | ICD-10-CM

## 2011-06-03 LAB — RENAL FUNCTION PANEL
Albumin: 3.1 g/dL — ABNORMAL LOW (ref 3.5–5.2)
BUN: 76 mg/dL — ABNORMAL HIGH (ref 6–23)
Calcium: 5.7 mg/dL — CL (ref 8.4–10.5)
Chloride: 100 mEq/L (ref 96–112)
Creatinine, Ser: 16.97 mg/dL — ABNORMAL HIGH (ref 0.50–1.35)

## 2011-06-03 LAB — DIFFERENTIAL
Basophils Absolute: 0 10*3/uL (ref 0.0–0.1)
Basophils Relative: 0 % (ref 0–1)
Eosinophils Absolute: 0.1 10*3/uL (ref 0.0–0.7)
Monocytes Absolute: 0.6 10*3/uL (ref 0.1–1.0)
Neutro Abs: 3.8 10*3/uL (ref 1.7–7.7)

## 2011-06-03 LAB — LIPID PANEL: Cholesterol: 91 mg/dL (ref 0–200)

## 2011-06-03 LAB — COMPREHENSIVE METABOLIC PANEL
ALT: 10 U/L (ref 0–53)
Alkaline Phosphatase: 85 U/L (ref 39–117)
BUN: 80 mg/dL — ABNORMAL HIGH (ref 6–23)
CO2: 22 mEq/L (ref 19–32)
Chloride: 101 mEq/L (ref 96–112)
GFR calc Af Amer: 3 mL/min — ABNORMAL LOW (ref >60)
Glucose, Bld: 108 mg/dL — ABNORMAL HIGH (ref 70–99)
Potassium: 4.2 mEq/L (ref 3.5–5.1)
Sodium: 138 mEq/L (ref 135–145)
Total Bilirubin: 0.2 mg/dL — ABNORMAL LOW (ref 0.3–1.2)

## 2011-06-03 LAB — ANA: Anti Nuclear Antibody(ANA): NEGATIVE

## 2011-06-03 LAB — SICKLE CELL SCREEN: Sickle Cell Screen: NEGATIVE

## 2011-06-03 LAB — CBC
HCT: 18.6 % — ABNORMAL LOW (ref 39.0–52.0)
Hemoglobin: 6.5 g/dL — CL (ref 13.0–17.0)
MCH: 29.6 pg (ref 26.0–34.0)
MCHC: 34.6 g/dL (ref 30.0–36.0)
Platelets: 101 10*3/uL — ABNORMAL LOW (ref 150–400)
RBC: 1.89 MIL/uL — ABNORMAL LOW (ref 4.22–5.81)
RDW: 14.4 % (ref 11.5–15.5)
WBC: 7.2 10*3/uL (ref 4.0–10.5)

## 2011-06-03 LAB — HEPATITIS B SURFACE ANTIGEN: Hepatitis B Surface Ag: POSITIVE — AB

## 2011-06-03 LAB — HEPATITIS B SURFACE ANTIBODY,QUALITATIVE: Hep B S Ab: NEGATIVE

## 2011-06-03 LAB — PROTIME-INR: Prothrombin Time: 16.2 seconds — ABNORMAL HIGH (ref 11.6–15.2)

## 2011-06-03 LAB — TSH: TSH: 1.465 u[IU]/mL (ref 0.350–4.500)

## 2011-06-03 LAB — ABO/RH: ABO/RH(D): O NEG

## 2011-06-03 LAB — HEPATITIS B CORE ANTIBODY, TOTAL: Hep B Core Total Ab: POSITIVE — AB

## 2011-06-03 LAB — HIV ANTIBODY (ROUTINE TESTING W REFLEX): HIV: REACTIVE — AB

## 2011-06-03 LAB — PREPARE RBC (CROSSMATCH)

## 2011-06-03 LAB — MAGNESIUM: Magnesium: 1.6 mg/dL (ref 1.5–2.5)

## 2011-06-03 LAB — HEMOGLOBIN A1C
Hgb A1c MFr Bld: 5.3 % (ref <5.7)
Mean Plasma Glucose: 105 mg/dL (ref <117)

## 2011-06-03 LAB — PHOSPHORUS: Phosphorus: 8.4 mg/dL — ABNORMAL HIGH (ref 2.3–4.6)

## 2011-06-03 LAB — PRO B NATRIURETIC PEPTIDE: Pro B Natriuretic peptide (BNP): 2116 pg/mL — ABNORMAL HIGH (ref 0–125)

## 2011-06-04 ENCOUNTER — Inpatient Hospital Stay (HOSPITAL_COMMUNITY): Payer: Medicaid Other

## 2011-06-04 DIAGNOSIS — B20 Human immunodeficiency virus [HIV] disease: Secondary | ICD-10-CM

## 2011-06-04 DIAGNOSIS — N186 End stage renal disease: Secondary | ICD-10-CM

## 2011-06-04 DIAGNOSIS — Z01810 Encounter for preprocedural cardiovascular examination: Secondary | ICD-10-CM

## 2011-06-04 LAB — CBC
HCT: 22.9 % — ABNORMAL LOW (ref 39.0–52.0)
Hemoglobin: 7.9 g/dL — ABNORMAL LOW (ref 13.0–17.0)
MCH: 29.3 pg (ref 26.0–34.0)
MCV: 84.8 fL (ref 78.0–100.0)
RBC: 2.7 MIL/uL — ABNORMAL LOW (ref 4.22–5.81)

## 2011-06-04 LAB — PROTEIN ELECTROPH W RFLX QUANT IMMUNOGLOBULINS
Beta Globulin: 3.8 % — ABNORMAL LOW (ref 4.7–7.2)
Gamma Globulin: 42.9 % — ABNORMAL HIGH (ref 11.1–18.8)
M-Spike, %: NOT DETECTED g/dL
Total Protein ELP: 8.9 g/dL — ABNORMAL HIGH (ref 6.0–8.3)

## 2011-06-04 LAB — COMPREHENSIVE METABOLIC PANEL
CO2: 23 mEq/L (ref 19–32)
Calcium: 6.4 mg/dL — CL (ref 8.4–10.5)
Creatinine, Ser: 13.56 mg/dL — ABNORMAL HIGH (ref 0.50–1.35)
GFR calc Af Amer: 5 mL/min — ABNORMAL LOW (ref >60)
GFR calc non Af Amer: 4 mL/min — ABNORMAL LOW (ref >60)
Glucose, Bld: 91 mg/dL (ref 70–99)

## 2011-06-04 NOTE — Procedures (Signed)
HISTORY:  A 55 year old male, status post seizure.  MEDICATIONS:  Amlodipine, alprazolam, Vicodin, allopurinol, indomethacin and Aranesp.  CONDITIONS OF RECORDING:  This is a 16-channel EEG carried with the patient in the awake and drowsy states.  DESCRIPTION:  The waking background activity consist of a low-voltage symmetrical fairly well-organized 8-9 Hz alpha activity seen from the parieto-occipital and posterotemporal regions.  Low-voltage fast activity poorly organized was seen and during at times superimposed on more posterior rhythms.  A mixture of theta and beta was seen from the central and temporal regions.  The patient drowses with slowing to irregular which is theta and beta activity.  Stage II sleep was not obtained.  Hypoventilation and intermittent photic stimulation were both performed, but failed to elicit any significant change in the tracing.  IMPRESSION:  This is a normal EEG.          ______________________________ Alexis Goodell, MD    [id number] D:  10/08/2010 19:03:02  T:  10/08/2010 20:34:52  Job #:  [id number]

## 2011-06-05 DIAGNOSIS — R011 Cardiac murmur, unspecified: Secondary | ICD-10-CM

## 2011-06-05 LAB — UIFE/LIGHT CHAINS/TP QN, 24-HR UR
Albumin, U: DETECTED
Alpha 2, Urine: DETECTED — AB
Beta, Urine: DETECTED — AB
Free Lambda Lt Chains,Ur: 19.1 mg/dL — ABNORMAL HIGH (ref 0.02–0.67)
Gamma Globulin, Urine: DETECTED — AB

## 2011-06-05 LAB — CBC
HCT: 24.1 % — ABNORMAL LOW (ref 39.0–52.0)
Hemoglobin: 8.2 g/dL — ABNORMAL LOW (ref 13.0–17.0)
MCH: 29.1 pg (ref 26.0–34.0)
MCV: 85.5 fL (ref 78.0–100.0)
RBC: 2.82 MIL/uL — ABNORMAL LOW (ref 4.22–5.81)
WBC: 7.4 10*3/uL (ref 4.0–10.5)

## 2011-06-05 LAB — COMPREHENSIVE METABOLIC PANEL
AST: 23 U/L (ref 0–37)
BUN: 41 mg/dL — ABNORMAL HIGH (ref 6–23)
CO2: 24 mEq/L (ref 19–32)
Calcium: 6.3 mg/dL — CL (ref 8.4–10.5)
Chloride: 96 mEq/L (ref 96–112)
Creatinine, Ser: 11.78 mg/dL — ABNORMAL HIGH (ref 0.50–1.35)
GFR calc Af Amer: 5 mL/min — ABNORMAL LOW (ref >60)
GFR calc non Af Amer: 4 mL/min — ABNORMAL LOW (ref >60)
Glucose, Bld: 101 mg/dL — ABNORMAL HIGH (ref 70–99)
Total Bilirubin: 0.2 mg/dL — ABNORMAL LOW (ref 0.3–1.2)

## 2011-06-05 LAB — ANTI-NEUTROPHIL ANTIBODY

## 2011-06-05 LAB — HEPATITIS PANEL, ACUTE
Hep A IgM: NEGATIVE
Hep B C IgM: NEGATIVE

## 2011-06-05 LAB — DIFFERENTIAL
Eosinophils Absolute: 0.2 10*3/uL (ref 0.0–0.7)
Lymphocytes Relative: 29 % (ref 12–46)
Lymphs Abs: 2.1 10*3/uL (ref 0.7–4.0)
Monocytes Relative: 13 % — ABNORMAL HIGH (ref 3–12)
Neutro Abs: 4.1 10*3/uL (ref 1.7–7.7)
Neutrophils Relative %: 55 % (ref 43–77)

## 2011-06-05 LAB — HIV-1 RNA QUANT-NO REFLEX-BLD: HIV-1 RNA Quant, Log: 4.73 {Log} — ABNORMAL HIGH (ref <1.30)

## 2011-06-05 LAB — T-HELPER CELLS (CD4) COUNT (NOT AT ARMC)
CD4 % Helper T Cell: 12 % — ABNORMAL LOW (ref 33–55)
CD4 T Cell Abs: 330 uL — ABNORMAL LOW (ref 400–2700)

## 2011-06-05 LAB — IMMUNOFIXATION ELECTROPHORESIS: IgG (Immunoglobin G), Serum: 4440 mg/dL — ABNORMAL HIGH (ref 650–1600)

## 2011-06-05 LAB — HEPATITIS B DNA, ULTRAQUANTITATIVE, PCR
Hepatitis B DNA (Calc): 3125 copies/mL — ABNORMAL HIGH (ref <116)
Hepatitis B DNA: 537 IU/mL — ABNORMAL HIGH (ref <20)

## 2011-06-05 LAB — IGG, IGA, IGM: IgG (Immunoglobin G), Serum: 4440 mg/dL — ABNORMAL HIGH (ref 650–1600)

## 2011-06-05 NOTE — Consult Note (Signed)
NAMECAMRON, Jeffrey [name] NO.:  [id number]  MEDICAL RECORD NO.:  [id number]  LOCATION:  2605                         FACILITY:  [facility]  PHYSICIAN:  Jeffrey [name], M.D.DATE OF BIRTH:  17-Nov-1955  DATE OF CONSULTATION: DATE OF DISCHARGE:  06/02/2011                                CONSULTATION   CONSULTING PHYSICIAN:  [facility].  REASON FOR CONSULTATION:  Acute versus chronic renal failure with seizure.  HISTORY OF PRESENT ILLNESS:  This is a 56 year old gentleman who presents after having a witness seizure at home by his family.  He had an aura prior to that.  He had some __________ twitching, decreased energy, decreased urine output and edema of his lower extremities and irritability.  He has had hypertension over 10 years, gout for a similar duration and flares about one to two times per month.  He takes nonsteroidals.  He is also on chronic allopurinol.  Dr. Jana [name] is his primary physician.  He does not know if he has had blood drawn in the last few years.  He has gout involving the left knee and foot.  His only meds at home include amlodipine 10 mg a day, Xanax 0.5 mg b.i.d. p.r.n., Indocin 50 mg t.i.d.,  p.r.n. allopurinol 300 mg a day. He takes Aleve and ibuprofen also  He used to be on Dilantin, but is no longer at the current time.  He has no family history of renal disease.  No family history of hearing defects, eye defects, musculoskeletal defects.  Mother died in 20s of hypertension.  Father died in his 82s of cancer.  He has a brother and sister who are healthy.  No history of diabetes.  He has had an HIV screen in the past.  He denies symptoms of nausea, vomiting, diabetes, decrease in appetite, fevers, chills, cough.  He smokes off and on but has not smoked continuously ever.  He had no more than 2-3 cigarettes a week.  He has had no chest pain, shortness of breath, PND, orthopnea.  He has nocturia x1.  He has had a  variety of  __________ in the past including headaches, arthritis, blood pressure issues and shortness of breath, nothing really found.  He had a creatinine on February 20, 2004 of 1.8; now his creatinine is 23.  Past medical history as above.  He has apparently had no operations.  No hospitalizations.  SOCIAL HISTORY:  He lives by himself.  He is an Environmental manager, used to be in education.  Denies alcohol.  Denies street drugs.  Smoking as above.  FAMILY HISTORY:  As listed above.  REVIEW OF SYSTEMS:  HEENT:  He has occasional headache, not regular.  No visual difficulties.  No hearing difficulties.  No dry eyes, dry mouth. GI:  No complaints.  No history of hepatitis or jaundice. MUSCULOSKELETAL:  The gout is the primary issue.  NEUROLOGIC:  Negative other than being somewhat anxious.  SKIN:  Unremarkable. CARDIOVASCULAR:  As above.  PULMONARY:  As above.  OBJECTIVE PHYSICAL EXAMINATION:  VITAL SIGNS:  Blood pressure 160/96, heart rate 90, and 98 degrees temp. GENERAL:  He is extremely tremulous with rapid tremor  the extremities. He is anxious. HEENT:  Arteriolar narrowing, some AV nicking of the fundi.  Edentulous. NECK:  Without masses, thyromegaly. CARDIOVASCULAR:  Regular rhythm.  S4.  Grade 2/6 holosystolic murmur heard best at lower left sternal border.  PMI 10 cm down from the mid sternal 5th intercostal space.  No edema.  No bruits.  His pulse is 2+/4+. LUNGS:  No rales, rhonchi, or wheezes.  Slight decreased breath sounds. SKIN:  Extremely dry. ABDOMEN:  Active bowel sounds, soft, nontender.  Liver is down 2 cm.  LABORATORY DATA:  His hemoglobin is 7.1, white count of 97, platelet count 113,000.  Chemistries; sodium 139, potassium 6.3, chloride of 100, bicarbonate of 9, creatinine 23.2, BUN 116, glucose 127, albumin 3, total protein of 10.3, calcium of 4.3.  On February 20, 2004, sodium 133, potassium 4.2, chloride 109, bicarb of 24, creatinine of 1.8, BUN of 15, glucose 105,  hemoglobin was 11.3 at that time.  ASSESSMENT: 1. Renal failure.  It is not clear this is acute versus chronic.  He     has many characteristics of chronic disease.  He is severely     acidemia, uremic, mild __________  slowing with hyperkalemic at     this time and needs acute dialysis.  We will have to put a femoral     in to do that at the current time.  He probably will require     hemodialysis, but we will need to sort this out, acute versus     chronic __________ his injury, very concerned about multiple     myeloma with his  __________ gap, hypertension, nonsteroidal use     with acute interstitial disease and hemodynamic lesion may be     possible however, he is not __________  most likely this is the     dysproteinemia. 2. Anemia, rule out renal causes, multiple myeloma, rule out iron     deficiency. 3. Decreased serum calcium, question if phosphate is extremely high or     if there another cause for this. 4. Hypertension, not a concern at this time. 5. Anxiety.  PLAN: 1. Hemodialysis. 2. Femoral catheter. 3. Serologies. 4. Protein studies. 5. Hypertonic screen. 6. __________ screen. 7. Uric acid. 8. PT/PTT 9. PTH. 10.HIV. 11.__________ 12.LDH.          ______________________________ Jeffrey [name]. Jeffrey [name], M.D.     JLD/MEDQ  D:  06/02/2011  T:  06/03/2011  Job:  [id number]  Electronically Signed by Jeffrey [name] M.D. on 06/05/2011 04:00:15 PM

## 2011-06-06 ENCOUNTER — Inpatient Hospital Stay (HOSPITAL_COMMUNITY): Payer: Medicaid Other

## 2011-06-06 LAB — CROSSMATCH
Antibody Screen: NEGATIVE
Unit division: 0

## 2011-06-06 LAB — HIV 1/2 CONFIRMATION
HIV-1 antibody: POSITIVE
HIV-2 Ab: NEGATIVE

## 2011-06-06 LAB — CBC
HCT: 20.4 % — ABNORMAL LOW (ref 39.0–52.0)
Hemoglobin: 7 g/dL — ABNORMAL LOW (ref 13.0–17.0)
MCH: 29.8 pg (ref 26.0–34.0)
MCHC: 34.3 g/dL (ref 30.0–36.0)
MCV: 86.8 fL (ref 78.0–100.0)
Platelets: 106 10*3/uL — ABNORMAL LOW (ref 150–400)
RBC: 2.35 MIL/uL — ABNORMAL LOW (ref 4.22–5.81)
RDW: 15.1 % (ref 11.5–15.5)
WBC: 8.8 10*3/uL (ref 4.0–10.5)

## 2011-06-06 LAB — RENAL FUNCTION PANEL
Calcium: 5.9 mg/dL — CL (ref 8.4–10.5)
GFR calc Af Amer: 5 mL/min — ABNORMAL LOW (ref >60)
GFR calc non Af Amer: 4 mL/min — ABNORMAL LOW (ref >60)
Phosphorus: 4.7 mg/dL — ABNORMAL HIGH (ref 2.3–4.6)
Potassium: 4 mEq/L (ref 3.5–5.1)
Sodium: 131 mEq/L — ABNORMAL LOW (ref 135–145)

## 2011-06-07 LAB — CBC
MCH: 29.7 pg (ref 26.0–34.0)
MCHC: 33.9 g/dL (ref 30.0–36.0)
MCV: 87.7 fL (ref 78.0–100.0)
Platelets: 134 10*3/uL — ABNORMAL LOW (ref 150–400)
RDW: 15.2 % (ref 11.5–15.5)

## 2011-06-07 LAB — BASIC METABOLIC PANEL
Calcium: 7 mg/dL — ABNORMAL LOW (ref 8.4–10.5)
Creatinine, Ser: 8.56 mg/dL — ABNORMAL HIGH (ref 0.50–1.35)
GFR calc Af Amer: 8 mL/min — ABNORMAL LOW (ref >60)
GFR calc non Af Amer: 6 mL/min — ABNORMAL LOW (ref >60)
Sodium: 129 mEq/L — ABNORMAL LOW (ref 135–145)

## 2011-06-07 NOTE — Consult Note (Signed)
NAMEZISHA, MONTJOY NO.:  [id number]  MEDICAL RECORD NO.:  [id number]  LOCATION:  2605                         FACILITY:  [facility]  PHYSICIAN:  Alexis Goodell, MD    DATE OF BIRTH:  [birth year]  DATE OF CONSULTATION:  06/02/2011 DATE OF DISCHARGE:  06/02/2011                                CONSULTATION   REASON FOR CONSULTATION:  Seizure in the setting of acute renal failure.  HISTORY OF PRESENT ILLNESS:  This is a 55 year old male with past medical history of hypertension, anxiety and gout.  The patient was recently given indomethacin 2 weeks ago by his primary care doctor for flare up of gout.  The patient states he has been taking the medication on a daily basis for the past 2 weeks.  During these 2 weeks he has not noticed any abnormal muscle movements or mental functioning abnormalities.  Today, the patient woke up, was with his brother when he noted that his legs became suddenly weak.  The patient slowly slumped to the floor and then does not recall anything other than EMS staff surrounding him.  Per his brother the patient had a tonic-clonic seizure.  Time of seizures unknown.  The patient does recall feeling very confused postseizure activity but now is back to his baseline.  The patient at this time still continues to have a postural tremor and polymyoclonus in the setting of a creatinine of 23.2, acute renal failure with a GFR of 2.  PAST MEDICAL HISTORY:  Noted above.  MEDICATIONS AT HOME:  He is on alprazolam p.r.n., allopurinol, Norco and indomethacin which was prescribed 2 weeks ago.  ALLERGIES:  PENICILLIN.  FAMILY HISTORY:  Negligible.  SOCIAL HISTORY:  The patient does not smoke.  He drinks occasionally. He does not do any illicit drugs.  REVIEW OF SYSTEMS:  Negative with exception above.  PHYSICAL EXAMINATION:  VITAL SIGNS:  Blood pressure is 141/85, pulse 101, respirations 20, temperature 98.0. GENERAL:  The patient is alert and  oriented x3.  He carries out two to three-step commands without difficulty. HEENT:  Pupils are equal and reactive to light and accommodating. Conjugate gaze.  Extraocular movements intact.  Visual fields are grossly intact.  Face is symmetrical.  Tongue is midline.  Uvula is midline.  The patient's finger-to-nose and heel-to-shin are smooth. Facial sensation is grossly intact.  The patient shows a 5/5 strength. He does show a postural tremor.  The patient also shows polymyoclonus when resting.  Deep tendon reflexes are 2+ throughout, downgoing toes bilaterally.  The patient shows no drift in the upper or lower extremities. PULMONARY:  Clear to auscultation. CARDIOVASCULAR:  S1 and S2 is audible.  Sensation is grossly intact.  LABORATORY FINDINGS:  GFR is 2.  Sodium 139, potassium 6.2, chloride 100, CO2 is 9, BUN is 116, creatinine is 23.2, glucose 127, white blood cell count 9.7, platelets are 113, hemoglobin 10.1, hematocrit 20.9. Imaging of head shows CT with negative intracranial abnormality.  ASSESSMENT:  A 55 year old male presents to the ED after new onset of seizure in the setting of acute renal failure with a BUN of 16, creatinine 23.2, glomerular filtration rate of 2  and a potassium of 6.3.  The patient now is alert and oriented showing no seizure activity but does show polymyoclonus and postural tremor most likely secondary to his renal failure.  RECOMMENDATIONS: 1. Agree wtih addressing renal failure. 2. EEG. 3. Ativan p.r.n. if seizure activity returns.   Would nnot start chronic AED's at this time.    Dr. Doy Mince has seen and evaluated the patient and agrees for the above- mentioned.     Etta Quill, PA-C   ______________________________ Alexis Goodell, MD    DS/MEDQ  D:  06/02/2011  T:  06/03/2011  Job:  [id number]  Electronically Signed by Etta Quill PA-C on 05/20/202012 11:20:13 AM Electronically Signed by Alexis Goodell MD on 06/07/2011 09:43:33 AM

## 2011-06-08 LAB — BASIC METABOLIC PANEL
BUN: 43 mg/dL — ABNORMAL HIGH (ref 6–23)
CO2: 25 mEq/L (ref 19–32)
Chloride: 89 mEq/L — ABNORMAL LOW (ref 96–112)
Creatinine, Ser: 11.68 mg/dL — ABNORMAL HIGH (ref 0.50–1.35)

## 2011-06-08 LAB — URINALYSIS, ROUTINE W REFLEX MICROSCOPIC
Glucose, UA: NEGATIVE mg/dL
Ketones, ur: 15 mg/dL — AB
pH: 5 (ref 5.0–8.0)

## 2011-06-08 LAB — CBC
MCH: 29.9 pg (ref 26.0–34.0)
MCHC: 34.1 g/dL (ref 30.0–36.0)
Platelets: 148 10*3/uL — ABNORMAL LOW (ref 150–400)

## 2011-06-08 LAB — CULTURE, BLOOD (ROUTINE X 2)
Culture  Setup Time: 201209112213
Culture  Setup Time: 201209112214
Culture: NO GROWTH

## 2011-06-08 LAB — POCT I-STAT 4, (NA,K, GLUC, HGB,HCT)
Hemoglobin: 7.8 g/dL — ABNORMAL LOW (ref 13.0–17.0)
Sodium: 141 mEq/L (ref 135–145)

## 2011-06-08 LAB — URINE MICROSCOPIC-ADD ON

## 2011-06-08 NOTE — Op Note (Signed)
NAMEDREGAN, PALUBICKI NO.:  [id number]  MEDICAL RECORD NO.:  [id number]  LOCATION:  2605                         FACILITY:  [facility]  PHYSICIAN:  Conrad [place]roc[place], MD       DATE OF BIRTH:  [birth year]  DATE OF PROCEDURE: DATE OF DISCHARGE:  06/02/2011                              OPERATIVE REPORT   PROCEDURES: 1. Right internal jugular vein cannulation under ultrasound guidance. 2. Right internal jugular vein tunneled dialysis catheter placement.  PREOPERATIVE DIAGNOSIS:  End-stage renal disease requiring hemodialysis.  POSTOPERATIVE DIAGNOSIS:  End-stage renal disease requiring hemodialysis.  SURGEON:  Aaron Edelman L. Bridgett Larsson, MD  ANESTHESIA:  Monitored anesthesia care and local.  FINDINGS:  Tips of the catheter in right atrium.  SPECIMENS:  None.  ESTIMATED BLOOD LOSS:  Minimal.  INDICATIONS:  This is a 55 year old patient who acutely was found to be in an acute renal failure, seized in the ER and was found to be in profound uremic shock.  Apparently they initially placed a temporary dialysis catheter and had undergone some dialysis but unfortunately this catheter was not functioning properly and had to be removed. Subsequently request for tunneled dialysis catheter placement was made with the plan to possibly dialyzing further today.  The patient is aware of the risks of this procedure including bleeding, infection, possible central venous injury, possible pneumothorax.  He is aware of these risks and agreed to proceed forward.  DESCRIPTION OF THE OPERATION:  After full informed written consent was obtained from the patient, he was brought back to the operating room and placed supine upon the operating room table.  Prior to induction he received IV antibiotics.  After obtaining adequate sedation, he was prepped and draped in standard fashion for either a chest or neck tunneled dialysis catheter placement on both sides.  Turned my attention to his right neck  under ultrasound guidance.  I cannulated the right internal jugular vein, passed the wire down into the inferior vena cava. The wire was clamped to the drapes.  I then made stab incisions on the chest and at the neck after injecting a total about 20 mL of 1% lidocaine with epinephrine.  I then dissected from the chest and neck with metal tunneler and dilated this tract with dilator.  At this point I unclamped the wire, pulled out the needle and then serially dilated up the skin tract and venotomy.  Finally I placed the dilator sheath over the wire under fluoroscopic guidance down into the superior vena cava. The wire and dilator removed.  A 23-cm Diatek catheter was then loaded through the sheath down into the right atrium.  The sheath was broken and peeled away while holding the cuff of the catheter in place, to back in this catheter was connected to metal tunneler, passed in retrograde fashion through the subcutaneous tunnel.  Catheter was then pulled in appropriate location.  I then transected back end of this catheter filling the two lumens of this catheter.  Two ports were docked onto these 2 lumens and the catheter hub was screwed into place.  Then tested each port by aspirating and flushing.  There was no resistance in each  port and then loaded each port with heparinized saline.  I had one last check under fluoroscopy.  There were no kinks and 2 tips of the catheter were noted to be in the right atrium.  At this point I secured the catheter in place with interrupted stitch of nylon tied to the catheter. The neck incision was then closed with an U-stitch of 4-0 Monocryl. Skin was cleaned, dried in all locations.  Sterile bandages were applied.  Dermabond was also applied to the neck incision.  At this point each port was then loaded with concentrated heparin 1000 unit/mL at manufacturer recommend of volumes to each port.  The patient tolerated the procedure fine.  No complications  and the condition was stable.     Conrad [place], MD     BLC/MEDQ  D:  06/03/2011  T:  10-12-2010  Job:  [id number]  Electronically Signed by Adele Barthel MD on 06/08/2011 12:40:13 PM

## 2011-06-08 NOTE — Consult Note (Signed)
Jeffrey [name], [name] NO.:  [id number]  MEDICAL RECORD NO.:  [id number]  LOCATION:  2605                         FACILITY:  [facility]  PHYSICIAN:  Jeffrey [place], MD       DATE OF BIRTH:  03/21/1956  DATE OF CONSULTATION: DATE OF DISCHARGE:  06/02/2011                                CONSULTATION   Consultation is being requested by Dr. Jimmy Footman.  REASON FOR CONSULTATION:  Evaluation for tunneled dialysis catheter and permanent access.  HISTORY OF PRESENT ILLNESS:  This is a 55 year old gentleman who is brought to the emergency room yesterday presenting with seizure activity.  On his admission, labs were found to have a creatinine of 23. He had a temporary femoral dialysis catheter that was subsequently placed.  He did undergo by report some dialysis.  This catheter was complicated by some bleeding issues which required a pursestring suture placement by Dr. Scot Dock.  The patient denies any previous history of renal failure.  The patient is not clear of the exact etiology for his renal failure.  He denies any uremia symptoms previously and denies any prior seizure activity.  He states he is right-hand dominant.  PAST MEDICAL HISTORY: 1. End-stage renal disease. 2. Seizure. 3. Hypertension. 4. Gout. 5. Anemia. 6. Thrombocytopenia.  PAST SURGICAL HISTORY:  None.  SOCIAL HISTORY:  Smoked less than a pack a day.  Denies any alcohol or illicit drug use.  FAMILY HISTORY:  Father died in the 65s with cancer.  Mother died due to complications related to hypertension in the 52s.  MEDICATIONS:  The only medicine as an outpatient he was consistently on was Atarax and indomethacin.  Also amlodipine, Xanax, allopurinol.  ALLERGIES:  PENICILLIN.  REVIEW OF SYSTEMS:  As listed above, otherwise was noted by the patient to be negative.  PHYSICAL EXAMINATION:  VITAL SIGNS:  He had a blood pressure 140/99, heart rate of 91, respirations 16, temperature is  99.3. GENERAL:  A well developed, well nourished, no apparent distress. HEAD:  Normocephalic, atraumatic. ENT:  Ears grossly intact.  Oropharynx without any erythema or exudate. Nares without any drainage. NECK:  Supple neck with no nuchal rigidity.  No evidence of any previous central lines. EYES:  Pupils were equal, round, and reactive to light.  Extraocular movements were intact. PULMONARY:  Symmetric expansion.  Clear to auscultation bilaterally with repeat inspirations.  There are some rales in the lower bases.  No wheezing or rhonchi per se. CARDIAC:  Regular rate and rhythm.  Normal S1, S2.  No murmurs, rubs, thrills, or gallops. ABDOMEN:  Soft abdomen nontender, nondistended.  No guarding, no rebound, no hepatosplenomegaly, no obvious masses. CARDIOVASCULAR:  Bilateral upper extremities had palpable radial and brachial pulses.  Carotids were easily palpable.  No obvious bruits. Abdominal aorta could not be palpated due to the patient's obesity. Bilateral femorals were easily palpable.  No palpable popliteals bilaterally.  Faintly bilateral pedal pulses could be palpated. MUSCULOSKELETAL:  Grossly 5/5 strength in all extremities.  There are no signs of ischemia in any extremity. NEURO:  Cranial nerves II-XII are grossly intact.  Motor strength as listed above.  Sensation is grossly intact in  all extremities. PSYCH:  Judgment appeared to be intact.  His mood and affect were appropriate for his clinical situation.  He is a little fearful. SKIN:  I did not note any rashes elsewhere. LYMPHATICS:  No cervical, axillary, or inguinal lymphadenopathy was noted.  He had laboratory studies completed which included immunoglobulin electrophoresis.  This demonstrates a expansion of his IgG and IgM. Hepatitis B core antibody was positive.  His parathyroid was elevated up to 576.3.  Thyroid stimulation was normal at 1.46.  ANA was negative.  C- reactive protein was 0.74.  A1c was 5.3.   Blood cultures to date are negative.  Chemistries:  Sodium 138, potassium 4.2, chloride 101, bicarb 22, glucose 108, BUN was 80, creatinine 17.47.  CBC this morning, white count 6.2 with an H and H of 5.6 and 16.2, platelet count of 101,000.  MEDICAL DECISION MAKING:  This is a 55 year old patient with acute renal failure, unknown etiology at this point.  He will need a tunneled dialysis catheter placement as his previous Tri-access catheter has been removed due to poor function and bleeding.  Additionally he is going to need to be evaluated for permanent access and he is going to need bilateral upper extremity vein mapping to facilitate this process.  We will place the tunneled dialysis catheter today as per my discussion with Dr. Jimmy Footman.  Possibly we will continue dialysis tonight.  We will make a final determination of access in the coming days once the vein mapping is complete.  We had a long discussion in regards to nature of access surgery.  The patient is aware of the risks of the tunneled dialysis catheter placement including bleeding, infection, possible central venous injury, possible pneumothorax.  He at this point agreed to proceed forward.  We will continue to follow on this patient.     Jeffrey [place], MD     BLC/MEDQ  D:  06/03/2011  T:  2020-10-1110  Job:  [id number]  Electronically Signed by Adele Barthel MD on 06/08/2011 12:40:15 PM

## 2011-06-09 ENCOUNTER — Inpatient Hospital Stay (HOSPITAL_COMMUNITY): Payer: Medicaid Other

## 2011-06-09 DIAGNOSIS — N186 End stage renal disease: Secondary | ICD-10-CM

## 2011-06-09 LAB — BASIC METABOLIC PANEL
BUN: 53 mg/dL — ABNORMAL HIGH (ref 6–23)
CO2: 25 mEq/L (ref 19–32)
Chloride: 90 mEq/L — ABNORMAL LOW (ref 96–112)
Creatinine, Ser: 12.96 mg/dL — ABNORMAL HIGH (ref 0.50–1.35)
GFR calc Af Amer: 5 mL/min — ABNORMAL LOW (ref >60)
Potassium: 4.6 mEq/L (ref 3.5–5.1)

## 2011-06-09 LAB — DIFFERENTIAL
Basophils Absolute: 0 10*3/uL (ref 0.0–0.1)
Lymphocytes Relative: 30 % (ref 12–46)
Lymphs Abs: 2.2 10*3/uL (ref 0.7–4.0)
Neutrophils Relative %: 54 % (ref 43–77)

## 2011-06-09 LAB — CULTURE, BLOOD (ROUTINE X 2): Culture: NO GROWTH

## 2011-06-09 LAB — CBC
HCT: 20.2 % — ABNORMAL LOW (ref 39.0–52.0)
MCV: 87.4 fL (ref 78.0–100.0)
Platelets: 163 10*3/uL (ref 150–400)
RBC: 2.31 MIL/uL — ABNORMAL LOW (ref 4.22–5.81)
WBC: 7.6 10*3/uL (ref 4.0–10.5)

## 2011-06-10 DIAGNOSIS — N189 Chronic kidney disease, unspecified: Secondary | ICD-10-CM | POA: Insufficient documentation

## 2011-06-10 LAB — CBC
HCT: 24.7 % — ABNORMAL LOW (ref 39.0–52.0)
Hemoglobin: 8.2 g/dL — ABNORMAL LOW (ref 13.0–17.0)
MCV: 87 fL (ref 78.0–100.0)
RDW: 16.1 % — ABNORMAL HIGH (ref 11.5–15.5)
WBC: 6.8 10*3/uL (ref 4.0–10.5)

## 2011-06-10 LAB — BASIC METABOLIC PANEL
BUN: 31 mg/dL — ABNORMAL HIGH (ref 6–23)
Chloride: 95 mEq/L — ABNORMAL LOW (ref 96–112)
Creatinine, Ser: 9.16 mg/dL — ABNORMAL HIGH (ref 0.50–1.35)
GFR calc Af Amer: 7 mL/min — ABNORMAL LOW (ref >60)
Glucose, Bld: 95 mg/dL (ref 70–99)
Potassium: 4.4 mEq/L (ref 3.5–5.1)

## 2011-06-11 ENCOUNTER — Inpatient Hospital Stay (HOSPITAL_COMMUNITY): Payer: Medicaid Other

## 2011-06-11 LAB — COMPREHENSIVE METABOLIC PANEL
ALT: 12 U/L (ref 0–53)
AST: 31 U/L (ref 0–37)
CO2: 25 mEq/L (ref 19–32)
Chloride: 89 mEq/L — ABNORMAL LOW (ref 96–112)
GFR calc non Af Amer: 4 mL/min — ABNORMAL LOW (ref >60)
Sodium: 127 mEq/L — ABNORMAL LOW (ref 135–145)
Total Bilirubin: 0.2 mg/dL — ABNORMAL LOW (ref 0.3–1.2)

## 2011-06-11 LAB — CBC
HCT: 22.4 % — ABNORMAL LOW (ref 39.0–52.0)
Hemoglobin: 7.7 g/dL — ABNORMAL LOW (ref 13.0–17.0)
MCH: 29.4 pg (ref 26.0–34.0)
MCHC: 34.4 g/dL (ref 30.0–36.0)
MCV: 85.5 fL (ref 78.0–100.0)
Platelets: 163 10*3/uL (ref 150–400)

## 2011-06-11 NOTE — Op Note (Signed)
NAMEJABARI, Jeffrey [name] NO.:  [id number]  MEDICAL RECORD NO.:  [id number]  LOCATION:  [id number]                         FACILITY:  [facility]  PHYSICIAN:  Conrad [place], MD       DATE OF BIRTH:  1955/09/26  DATE OF PROCEDURE: DATE OF DISCHARGE:  06/02/2011                              OPERATIVE REPORT   PROCEDURE:  Left radiocephalic arteriovenous fistula.  PREOPERATIVE DIAGNOSES:  End-stage renal disease requiring hemodialysis and severe anemia.  POSTOPERATIVE DIAGNOSES:  End-stage renal disease requiring hemodialysis and severe anemia.  SURGEON:  Conrad [place], MD.  ASSISTANT:  Felipa Furnace, PA-C  ANESTHESIA:  General.  FINDINGS:  A palpable thrill and a dopplerable radial signal at the end of the case.  SPECIMENS:  None.  ESTIMATED BLOOD LOSS:  Minimal.  INDICATIONS:  This is a 55 year old gentleman with multiple medical comorbidities that was admitted to hospital, went into end-stage renal disease and became was quite uremic.  He required tunneled dialysis catheter placement to facilitate dialysis.  On subsequent vein mapping,  he was found have an acceptable cephalic vein.  On examination with tourniquet,  it appeared that he had an option for possible radiocephalic arteriovenous  fistula.  Subsequently, I felt this was an acceptable option prior to proceeding with a brachiocephalic fistula.  He is aware of the risks of this procedure include bleeding, infection, possible ischemic monomelic neuropathy, possible steal syndrome, possible nerve damage, possible failure to mature, and possible need for additional procedures.  He is aware of these risks and agreed to proceed forward with the operation.  DESCRIPTION OF OPERATION:  After full informed written consent was obtained from the patient, he was brought back to the operating room and placed supine upon the operating table.  Prior to induction, he received IV antibiotics.  After obtaining adequate  anesthesia, he was prepped and draped in a standard fashion for left arm access.  Immediately after  induction, he became hypotensive.  It  was felt partially due to the  patient's anemia.  Subsequently, he was given 2 units of packed red cells  to help stabilize his blood pressure.  I turned my attention first to the  level of his wrist.  I could easily see a large palpable cephalic vein branch  and also with some difficulty I was able to identify the location of the radial artery.  I made a longitudinal incision between these two vessels.  Using blunt dissection and electrocautery, I developed a plane down to the cephalic vein which immediately became evident. I dissected out the cephalic vein and it looked to be a good 3-4 mm in diameter externally.  This was  dissected out proximally and distally.  Then, I dissected through the underlying fascia and got access to the space over the radial artery.  This was dissected out proximally and distally.  The radial artery was only about 2.5-mm in diameter and had a weak pulse but it did have biphasic signals on Doppler examination and I gained control of this artery with vessel loops.  Then, I went back to the vein, dissected out more distally, clamped the vein, transected it, and tied  off the distal vein with 2-0 silk.  I interrogated the proximal vein and it  accepted up to 3 mm dilator with no problems and looked to be without  any evidence of any scar tissue whatsoever in this segment.  I then injected  heparinized saline in this vein, which had excellent backbleeding and then  clamped it.  At this point, I turned my attention to the artery which I placed under tension proximally and distally with vessel loops and then made an arteriotomy with a 11 blade, extended this arteriotomy slightly with a Potts scissor, and then the vein was sewn to the artery in a running fashion using 7-0 Prolene.  Prior to completing this anastomosis, I allowed  the artery to back bleed from both ends.  There was good back bleeding in antegrade fashion and some bleeding from the distal aspect of the vein also had excellent backbleeding.  I completed this anastomosis in the usual fashion.  I then released all vessel loops and clamps. Immediately, there was palpable thrill in the vein and I could not feel a radial pulse distally, but there was a dopplerable signal distally. Outflow had a Doppler signal consistent with a widely patent arteriovenous fistula.  At this point, I irrigated the arm.  There was no more active bleeding.  Subcutaneous tissues were reapproximated with running stitch of 3-0 Vicryl, the skin was then closed with running subcuticular 4-0 Monocryl. Skin was then cleaned, dried and reinforced with Dermabond.  The patient tolerated this procedure fine.  COMPLICATIONS:  None.  CONDITION:  Stable.     Conrad [place], MD     BLC/MEDQ  D:  06/09/2011  T:  06/09/2011  Job:  [id number]  Electronically Signed by Adele Barthel MD on 06/11/2011 10:39:05 AM

## 2011-06-12 LAB — BASIC METABOLIC PANEL
BUN: 25 mg/dL — ABNORMAL HIGH (ref 6–23)
Chloride: 94 mEq/L — ABNORMAL LOW (ref 96–112)
GFR calc Af Amer: 10 mL/min — ABNORMAL LOW (ref >60)
Potassium: 4 mEq/L (ref 3.5–5.1)
Sodium: 130 mEq/L — ABNORMAL LOW (ref 135–145)

## 2011-06-12 LAB — CULTURE, BLOOD (ROUTINE X 2)
Culture  Setup Time: 201209152010
Culture: NO GROWTH

## 2011-06-12 LAB — CBC
HCT: 27.9 % — ABNORMAL LOW (ref 39.0–52.0)
Hemoglobin: 9.6 g/dL — ABNORMAL LOW (ref 13.0–17.0)
RDW: 15.3 % (ref 11.5–15.5)
WBC: 7.2 10*3/uL (ref 4.0–10.5)

## 2011-06-12 LAB — IRON AND TIBC
Iron: 34 ug/dL — ABNORMAL LOW (ref 42–135)
UIBC: 127 ug/dL (ref 125–400)

## 2011-06-12 NOTE — H&P (Signed)
Jeffrey [name], [name]                 ACCOUNT NO.:  [id number]  MEDICAL RECORD NO.:  [id number]  LOCATION:  [facility]                         FACILITY:  [facility]  PHYSICIAN:  Jeffrey Poisson, MD       DATE OF BIRTH:  1956-08-23  DATE OF ADMISSION:  06/02/2011 DATE OF DISCHARGE:                             HISTORY & PHYSICAL   PRIMARY CARE PHYSICIAN:  Jeffrey [name].  CHIEF COMPLAINT:  He was brought in after seizure activity today.  HISTORY OF PRESENT ILLNESS:  This is a 55 year old gentleman with history of gout and hypertension who was recently seen in Urgent Care for a gouty flare-up of the left knee.  He was brought in by his brother after a seizure activity that happened this morning.  His brother witnessed the seizure activity.  As per the patient, he felt that he was sinking down followed by a fall and associated with generalized abnormal movements of his extremities, not associated with any frothing or bowel or bladder incontinence.  The patient does have postictal headache and confusion and appears to be returning to his baseline at this time.  The patient denies any fever, cough, chest pain, shortness of breath, nausea, vomiting, abdominal pain, diarrhea, or any urinary complaints. Does not have headache right now and no blurry vision.  Denies any tingling or numbness.  The patient has occasional twitching that has been going on for the last 1 week.  The patient denies any focal weakness anywhere.  REVIEW OF SYSTEMS:  See HPI.  PAST MEDICAL HISTORY: 1. Hypertension, on amlodipine 2. Gout.  SURGICAL HISTORY:  None.  FAMILY HISTORY:  No significance.  SOCIAL HISTORY:  The patient lives by himself.  Denies alcohol.  Smokes cigarettes.  Denies any recreational drug use.  ALLERGIES:  Allergic to PENICILLIN.  PHYSICAL EXAMINATION:  VITAL SIGNS:  The patient is afebrile, blood pressure 142s/90s, pulse of 98 per minute, respirations 18, and saturating 100% on 2 liters of  oxygen. GENERAL:  He is alert, afebrile, comfortable, in no acute distress. HEENT:  Pupils are reacting to light and accommodation.  No JVD.  No scleral icterus. CARDIOVASCULAR:  S1 and S2 heard. RESPIRATORY:  Chest is clear to auscultation bilaterally. ABDOMEN:  Soft, nontender, and nondistended.  Bowel sounds heard. EXTREMITIES:  No pedal edema. NEUROLOGICAL:  Able to move all his extremities.  He is slow to respond to questions, but he is able to answer all his questions.  No focal deficits.  PERTINENT LABORATORY DATA:  The patient had an alcohol which was less than 11.  Comprehensive metabolic panel significant for potassium of 6.3, bicarb of 9, glucose of 127, BUN 116, and creatinine 23.  Normal LFTs.  Total protein of 10.8, calcium of 4.3.  CBC showed a hemoglobin of 7.1, hematocrit of 20.9, WBC of 9.7, and platelets of 113.  RADIOLOGY:  The patient had a CT head without contrast which was negative for any acute intracranial changes.  ASSESSMENT AND PLAN:  This is a 55 year old gentleman with history of hypertension, admitted for seizures which is most likely secondary to metabolic encephalopathy from acute renal failure and hypocalcemia. 1. Seizure activity.  The patient at this  time appears to be in     postictal.  CT of head negative for any acute changes.  If he is,     we can do an EEG tomorrow.  Neurology consult was called for     further recommendations.  We will start him on IV Ativan p.r.n. for     seizure activity and seizure precautions. 2. Acute renal failure, hyperkalemia, and hypocalcemia.  The patient's     EKG showed some tall T-waves in V2, V3, and V4.  He was given     calcium gluconate.  Renal consult was called for possible     hemodialysis.  IV calcium infusion will be started in the ED. 3. Gout.  We will hold the indomethacin and allopurinol for now. 4. Hypertension.  Blood pressure is in 140s/90s.  We will restart the     medications later on if his  blood pressure gets uncontrolled. 5. Anemia.  The patient does not have a history of anemia.  It is     normocytic.  We will get an anemia profile.  We will send blood for     typing and crossmatching and will transfuse 1-2 units of PRBC.  If     his hemoglobin drops to less than 7, we will also send stool for     occult blood. 6. Thrombocytopenia, mild.  We will continue to monitor it.  We will     put him on SCDs for DVT prophylaxis. 7. The patient is a full code.          ______________________________ Jeffrey Poisson, MD     VA/MEDQ  D:  06/02/2011  T:  06/02/2011  Job:  [id number]  Electronically Signed by Jeffrey Poisson MD on 06/12/2011 11:51:21 AM

## 2011-06-13 ENCOUNTER — Inpatient Hospital Stay (HOSPITAL_COMMUNITY): Payer: Medicaid Other

## 2011-06-13 LAB — CROSSMATCH
ABO/RH(D): O NEG
Unit division: 0
Unit division: 0

## 2011-06-13 LAB — RENAL FUNCTION PANEL
Albumin: 2.6 g/dL — ABNORMAL LOW (ref 3.5–5.2)
BUN: 42 mg/dL — ABNORMAL HIGH (ref 6–23)
Chloride: 91 mEq/L — ABNORMAL LOW (ref 96–112)
GFR calc Af Amer: 7 mL/min — ABNORMAL LOW (ref >60)
GFR calc non Af Amer: 6 mL/min — ABNORMAL LOW (ref >60)
Potassium: 4.1 mEq/L (ref 3.5–5.1)
Sodium: 128 mEq/L — ABNORMAL LOW (ref 135–145)

## 2011-06-14 ENCOUNTER — Inpatient Hospital Stay (HOSPITAL_COMMUNITY): Payer: Medicaid Other

## 2011-06-14 LAB — URINALYSIS, ROUTINE W REFLEX MICROSCOPIC
Glucose, UA: NEGATIVE mg/dL
Ketones, ur: NEGATIVE mg/dL
Nitrite: NEGATIVE
Protein, ur: >300 mg/dL — AB
Urobilinogen, UA: 0.2 mg/dL (ref 0.0–1.0)

## 2011-06-14 LAB — URINE MICROSCOPIC-ADD ON

## 2011-06-15 LAB — HIV-1 GENOTYPR PLUS: Resistance Assoc RT Mutations: NOT DETECTED

## 2011-06-16 LAB — CBC
Hemoglobin: 8.9 g/dL — ABNORMAL LOW (ref 13.0–17.0)
MCH: 29.4 pg (ref 26.0–34.0)
MCHC: 33.1 g/dL (ref 30.0–36.0)
RDW: 16.1 % — ABNORMAL HIGH (ref 11.5–15.5)

## 2011-06-16 LAB — URINE CULTURE
Colony Count: >=100000
Culture  Setup Time: 201209232103

## 2011-06-16 LAB — RENAL FUNCTION PANEL
Albumin: 2.7 g/dL — ABNORMAL LOW (ref 3.5–5.2)
BUN: 63 mg/dL — ABNORMAL HIGH (ref 6–23)
Calcium: 7.7 mg/dL — ABNORMAL LOW (ref 8.4–10.5)
GFR calc Af Amer: 5 mL/min — ABNORMAL LOW (ref >60)
Glucose, Bld: 88 mg/dL (ref 70–99)
Phosphorus: 5.6 mg/dL — ABNORMAL HIGH (ref 2.3–4.6)
Potassium: 4.2 mEq/L (ref 3.5–5.1)
Sodium: 128 mEq/L — ABNORMAL LOW (ref 135–145)

## 2011-06-17 ENCOUNTER — Ambulatory Visit (INDEPENDENT_AMBULATORY_CARE_PROVIDER_SITE_OTHER): Payer: Self-pay | Admitting: Infectious Diseases

## 2011-06-17 ENCOUNTER — Encounter: Payer: Self-pay | Admitting: Infectious Diseases

## 2011-06-17 DIAGNOSIS — B20 Human immunodeficiency virus [HIV] disease: Secondary | ICD-10-CM | POA: Insufficient documentation

## 2011-06-17 DIAGNOSIS — N189 Chronic kidney disease, unspecified: Secondary | ICD-10-CM

## 2011-06-17 MED ORDER — RALTEGRAVIR POTASSIUM 400 MG PO TABS: 400.0000 mg | ORAL_TABLET | Freq: Two times a day (BID) | ORAL | Status: DC

## 2011-06-17 MED ORDER — TENOFOVIR DISOPROXIL FUMARATE 300 MG PO TABS: 300.0000 mg | ORAL_TABLET | ORAL | Status: DC

## 2011-06-17 MED ORDER — LAMIVUDINE 5 MG/ML PO SOLN: ORAL | Status: DC

## 2011-06-17 NOTE — Progress Notes (Signed)
  Subjective:    Patient ID: Jeffrey [name], male    DOB: 1956-05-06, 55 y.o.   MRN: [id number]  HPI 55 year old 55 year old male with a history of hypertension and Gout, who was admitted on 06/02/2011 after a witnessed seizure at home. In the emergency room he was found to have a creatinine of 23. During his hospital workup he was also found to be HIV positive with a CD4 330 and VL 53,400 (genotype naive). He was also found to have Hep B DNA 537. His risk factor for acquisition was his known HIV partner (he is on ATVr/EPZ). He underwent a AV fistula formation. He received dialysis in the hospital.His hospital course was complicated by Klebsiella (R-amp)  UTI and he was d/c with cipro on 06-16-11. Feels fine now. No more seizures. His AV fistula has been healing well, he has re-dressed it himself.  No f/c.    Having feeling of tremor. Thinks he may have been hallucinating while in hospital.    Review of Systems  Constitutional: Negative for fever and chills.  Respiratory: Negative for cough and shortness of breath.   Gastrointestinal: Positive for constipation. Negative for diarrhea.  Genitourinary: Negative for dysuria.       Objective:   Physical Exam  Constitutional: He appears well-developed and well-nourished.  Eyes: EOM are normal. Pupils are equal, round, and reactive to light.  Neck: Neck supple.  Cardiovascular: Normal rate, regular rhythm and normal heart sounds.   Pulmonary/Chest: Effort normal and breath sounds normal.       He has a HD catheter in his R chest that is NT at insertion site.   Abdominal: Soft. Bowel sounds are normal. There is no tenderness.  Musculoskeletal:       LUE AVG has clean wound. Non-tender.   Lymphadenopathy:    He has no cervical adenopathy.          Assessment & Plan:

## 2011-06-17 NOTE — Assessment & Plan Note (Addendum)
Will start him on 3TC/TFV/ISN. This will also treat his Hep B which is low level active at this point. He appears willing to take meds. Will have him meet with med assistance personal here in clinic.  Is offered condoms. Will see him back in 1 month.

## 2011-06-17 NOTE — Assessment & Plan Note (Signed)
Greatly appreciate partnering with [facility].

## 2011-06-24 ENCOUNTER — Other Ambulatory Visit: Payer: Self-pay | Admitting: *Deleted

## 2011-06-24 ENCOUNTER — Ambulatory Visit: Payer: Self-pay

## 2011-06-24 DIAGNOSIS — B20 Human immunodeficiency virus [HIV] disease: Secondary | ICD-10-CM

## 2011-06-24 MED ORDER — RALTEGRAVIR POTASSIUM 400 MG PO TABS: 400.0000 mg | ORAL_TABLET | Freq: Two times a day (BID) | ORAL | Status: DC

## 2011-06-24 MED ORDER — LAMIVUDINE 5 MG/ML PO SOLN: ORAL | Status: DC

## 2011-06-24 MED ORDER — TENOFOVIR DISOPROXIL FUMARATE 300 MG PO TABS: 300.0000 mg | ORAL_TABLET | ORAL | Status: DC

## 2011-07-02 ENCOUNTER — Encounter: Payer: Self-pay | Admitting: Vascular Surgery

## 2011-07-06 NOTE — Discharge Summary (Signed)
NAMEJADAVEON, Jeffrey [name] NO.:  [id number]  MEDICAL RECORD NO.:  [id number]  LOCATION:                                 FACILITY:  PHYSICIAN:  Verlee Monte, MD       DATE OF BIRTH:  1955/12/29  DATE OF ADMISSION:  06/02/2011 DATE OF DISCHARGE:  06/16/2011                              DISCHARGE SUMMARY   PRIMARY CARE PHYSICIAN:  Jana Hakim, MD  NEPHROLOGIST:  [facility].  DISCHARGE DIAGNOSES: 1. Seizures likely secondary to severe electrolyte abnormalities. 2. Acute on chronic renal failure necessitating emergent dialysis. 3. Human immunodeficiency virus. 4. Hepatitis B. 5. Urinary tract infection with Gram-negative rods. 6. Hypertension. 7. Anemia status post 5 units of packed red blood cells transfused     during this admission. 8. Gout. 9. Anxiety.  CONSULTATIONS: 1. Neurology, Dr. Alexis Goodell. 2. Nephrology, Dr. [name] Deterding. 3. Vascular, Dr. Adele Barthel. 4. Infectious Disease, Dr. Bobby Rumpf.  PROCEDURES: 1. Right IJ tunneled dialysis catheter placement on June 04, 2011. 2. Left AV fistula creation on June 11, 2011. 3. The patient was started on hemodialysis on June 04, 2011. 4. The patient received 5 units of packed red blood cells during his     admission.  Two were given on September 12, two were given on     September 19, and one was given on September 20.  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  His brother described the seizure as having generalized abnormal movements of his extremities.  No bowel incontinence or frothing at the mouth.  He did have a postictal headache and confusion. He was brought to the hospital where he was found to have acute renal failure with hyperkalemia and hypocalcemia.  His EKG showed  tall T-waves in V2, V3, and V4 and he was given calcium gluconate.  The patient was admitted to the [facility] for further evaluation and workup. 1. Acute on chronic kidney disease.  The patient was seen in     consultation by Dr. [name] Deterding who recommended emergent     dialysis.  Dr. Adele Barthel was called and a right IJ tunnel dialysis     catheter was placed.  The patient underwent dialysis initially on     September 11 and then subsequently on the 12th, 13th, 15th, 18th,     20th, and 22nd.  Over the course of his hospitalization, he     received 5 units of packed red blood cells in dialysis without any     complications.  On September 18, Dr. Adele Barthel created a left AV     fistula which appears to be healing well, has a palpable thrill and     will hopefully be available for dialysis access in about 6 weeks. 2. Seizures.  As mentioned, the patient was brought to the emergency     department for a seizure that was witnessed by his brother.  He was  seen in consultation by Dr. Alexis Goodell who recommended Ativan     for seizures p.r.n.  She also requested a EEG.  This was done on     September 13 and found to be within normal limits.  The patient's     CT head without contrast was negative.  Dr. Doy Mince felt that his     seizures were secondary to severe electrolyte abnormalities.  He     will not be on any medications going forward for seizures. 3. On admission, the patient's calcium was found to be 4.4.     Nephrology addressed this Zemplar, PhosLo, and calcium.  On     June 13, 2011, his serum calcium had slowly improved to 7.6     and will be continued to be monitored by the renal physicians. 4. Anemia.  The patient's initial hemoglobin was 5.6.  He received 2     units of packed red blood cells on September 12, then 2 units on     the 18th, and 1 unit on the 20th.  All of these were given in     hemodialysis without any complication.  He did have  an anemia panel     during this hospitalization.  Serum iron is 34, TIBC 161, percent     sat 21, ferritin 200.  His last CBC done on September 22 showed his     hemoglobin 8.2.  Again this will be monitored on an ongoing basis     at dialysis. 5. Gout.  The patient periodically has exacerbations of gout in his     left knee.  During this hospitalization, he was placed on 300 mg     p.o. allopurinol daily.  NSAIDs were held given his acute on     chronic renal disease and he was also given Norco for pain relief.     The patient's gout has improved. 6. Urinary tract infection.  On approximately September 22 or 23rd,     the patient developed low-grade fever.  Consequently a chest x-ray     was taken and found to be without any signs of infection.  A UA was     obtained and demonstrated 11-20 white blood cells with small     leukocytes.  Urine cultures showed greater than 100,000 colonies of     Gram-negative rods.  Internal Medicine checked with Dr. Bobby Rumpf of Infectious Disease who recommended placing the patient     on p.o. Cipro and he would follow up in his office on September 26. 7. HIV/hepatitis B.  At the request of the renal physicians, the     patient's HIV and acute hepatitis panel were checked.  He was found     to be positive for hepatitis B surface antigen and core antibody.     Hepatitis B DNA was elevated at 537 and hepatitis B DNA was     elevated at 3125. 8. HIV.  The patient's HIV test was reactive.  His HIV-1 RNA     quantitative was 53,400.  Dr. Bobby Rumpf was called and HIV     genotyping and DNA sequencing was undertaken.  The patient will be     seen in the Infectious Disease Clinic on September 26 for followup.     Both of his hepatitis B and his HIV and antivirals will be started     at that point in time.  He has never been on  antivirals therapy     previously, so consequently is new to all HIV medications.  PHYSICAL EXAMINATION:  GENERAL:  The  patient is alert and oriented.  He is in no apparent distress. VITAL SIGNS:  Temperature 99.7, pulse 96, respirations 19, blood pressure 142/74, O2 sats 97% on room air. GENERAL:  The patient's head is atraumatic, normocephalic.  Eyes are anicteric with pupils that are equal and round.  Nose shows no nasal discharge or exterior lesions.  Mouth has moist mucous membranes. NECK:  Supple with midline trachea.  No JVD.  No lymphadenopathy. CHEST:  Demonstrates no accessory muscle use.  He does have a mild cough that is nonproductive.  He does have right IJ catheter placed.  There is no erythema or drainage around the catheter.  No obvious signs of infection. LUNGS:  Clear to auscultation without any wheezes or crackles. HEART:  Has a regular rate and rhythm without obvious murmurs, rubs, or gallops. ABDOMEN:  Soft, nontender, nondistended with good bowel sounds. EXTREMITIES:  Show no clubbing, cyanosis, or edema.  He has a new left AV fistula placed.  Swelling has decreased minimal.  There is no drainage.  It is nontender to palpation.  It does have a palpable thrill. NEURO:  Cranial nerves II-XII are grossly intact.  The patient has no facial asymmetries.  No obvious focal neuro deficits. PSYCHIATRIC:  The patient is alert and oriented.  His demeanor is pleasant, cooperative.  Grooming is good.  MOST RECENT LABS:  On June 12, 2011, the patient's CBC was as follows:  WBC 7.2, hemoglobin 8.6, hematocrit 27.9, platelets 172,000. On September 27, the patient had a BMET which showed sodium 128, potassium 4.1, chloride 91, bicarb 26, glucose 101, BUN 42, creatinine 9.81, serum albumin 2.6, serum calcium 7.6, phosphorus 4.5.  As mentioned urinalysis done yesterday showed small leukocytes, 11-20 WBCs and his urine culture was positive for greater than 100,000 colonies of Gram-negative rods.  Earlier this admission, the patient had blood cultures drawn on September 11 and September 15.   These were all been negative.  His reports have been finalized.  RADIOLOGICAL EXAM:  On admission, the patient had a CT head without contrast.  It was negative for any acute intracranial process.    He had an ultrasound of his kidneys on September 12 that showed no hydronephrosis, increased bilateral renal echogenicity compatible with chronic medical renal disease.    June 05, 2011, the patient had a metastatic bone survey that showed degenerative changes, diffusely seen in the spine, left shoulder, and both hips.    On September 15, he had a chest x-ray that showed decreased resolved perihilar edema with no new cardiopulmonary abnormality  September 23 he had a chest x-ray that showed COPD/emphysema with scarring in the right lower lobe. No acute cardiopulmonary disease.  Stable mild cardiomegaly without pulmonary edema.  DISCHARGE INSTRUCTIONS:  The patient will be discharged to home at 8:00 a.m. tomorrow morning.  He will report to the [facility] in [place] at 9:00 a.m. tomorrow morning and start hemodialysis on a Tuesday, Thursday, Saturday schedule.  He will be seen in followup by the nephrologist at hemodialysis.  He will also be seen by Dr. Bobby Rumpf at the Infectious Disease Clinic on September 26.  DIET:  Renal diet.  The patient will also have primary care followup within the next 2-4 weeks.  He is scheduled to see Dr. Adele Barthel for followup of his vascular access.  He is to call  for an appointment to be scheduled between October 17 and October 20.  DISCHARGE MEDICATIONS: 1. Calcium 500 mg by mouth every 6 hours as needed. 2. Calcium acetate 667 mg 2 tablets by mouth 3 times a day with meals. 3. Carvedilol 3.125 mg 1 tablet by mouth twice daily with meals. 4. Guaifenesin 5 mL by mouth every 6 hours as needed for chest     congestion. 5. Tussionex cough syrup 5 mL by mouth every 12 hours as needed. 6. Hydroxyzine 25 mg 1 tablet by mouth  every 8 hours as needed. 7. Rena-Vite tablet, 1 tablet by mouth daily at bedtime. 8. Cipro 500 mg 1 pill by mouth daily for 6 more days. 9. Allopurinol 300 mg 1 tablet by mouth daily. 10.Alprazolam 0.5 mg 1 tablet by mouth twice daily as needed for     anxiety. 11.Amlodipine 10 mg 1 tablet by mouth daily at bedtime. 12.Vicodin. 13.Hydrocodone/acetaminophen 5/325, 1-2 tablets by mouth every 46     hours as needed for pain. 14.Stop taking indomethacin or any other Advil, Goody's powders, or     NSAIDs.     Melton Alar, PA   ______________________________ Verlee Monte, MD    MLY/MEDQ  D:  06/15/2011  T:  06/15/2011  Job:  [id number]  cc:   Jana Hakim, M.D. [facility]  Electronically Signed by Imogene Burn PA on 06/16/2011 03:15:43 PM Electronically Signed by Verlee Monte  on 07/06/2011 01:38:20 PM

## 2011-07-09 ENCOUNTER — Other Ambulatory Visit: Payer: Self-pay | Admitting: *Deleted

## 2011-07-09 ENCOUNTER — Encounter: Payer: Self-pay | Admitting: Vascular Surgery

## 2011-07-09 DIAGNOSIS — B20 Human immunodeficiency virus [HIV] disease: Secondary | ICD-10-CM

## 2011-07-09 MED ORDER — TENOFOVIR DISOPROXIL FUMARATE 300 MG PO TABS: 300.0000 mg | ORAL_TABLET | ORAL | Status: DC

## 2011-07-09 MED ORDER — LAMIVUDINE 5 MG/ML PO SOLN: ORAL | Status: DC

## 2011-07-09 MED ORDER — RALTEGRAVIR POTASSIUM 400 MG PO TABS: 400.0000 mg | ORAL_TABLET | Freq: Two times a day (BID) | ORAL | Status: DC

## 2011-07-10 ENCOUNTER — Ambulatory Visit (INDEPENDENT_AMBULATORY_CARE_PROVIDER_SITE_OTHER): Payer: Self-pay | Admitting: Vascular Surgery

## 2011-07-10 ENCOUNTER — Encounter: Payer: Self-pay | Admitting: Vascular Surgery

## 2011-07-10 ENCOUNTER — Telehealth: Payer: Self-pay | Admitting: *Deleted

## 2011-07-10 VITALS — BP 133/85 | HR 80 | Resp 16 | Ht 72.0 in | Wt 194.0 lb

## 2011-07-10 DIAGNOSIS — N186 End stage renal disease: Secondary | ICD-10-CM

## 2011-07-10 HISTORY — DX: End stage renal disease: N18.6

## 2011-07-10 NOTE — Telephone Encounter (Signed)
Pt reported to have knee pain.  RN spoke with pt.  Reports knee pain.  Requesting that Dr. Johnnye Sima be his Internal Medicine Primary Care MD.  RN advised that Dr. Johnnye Sima is his Infectious Disease MD not is Internal Medicine Primary Care MD.  Pt is seen by Dr. Arty Baumgartner for kidney concerns.  RN advised that pt needs to have Dr. Johnnye Sima refer him to the [facility] for internal medicine concerns at his upcoming appt with Dr. Johnnye Sima on 07/22/11.  Pt verbalized understanding.

## 2011-07-10 NOTE — Progress Notes (Signed)
[facility]  Postoperative Access Visit  History of Present Illness  Jeffrey [name] is a 55 y.o. year old male who presents for postoperative follow-up for: L RC AVF (Date: 06/02/11).  The patient's wounds are healed.  The patient notes no steal symptoms.  The patient is able to complete their activities of daily living.  The patient's current symptoms are: none.  Physical Examination  Filed Vitals:   07/10/11 1002  BP: 133/85  Pulse: 80  Resp: 16   LUE: Incision is healed, stray suture was removed, skin feels warm, hand grip is 5/5, sensation in digits is intact, palpable thrill, bruit can be auscultated, on sonosite: main forearm cephalic vein is dilating well: 5.5 - 6.5 mm diameters with few side branches  Medical Decision Making  Jeffrey [name] is a 55 y.o. year old male who presents s/p L RC AVF.  I suspect the L RC AVF will be mature in one more month  LUE access duplex in one month to check maturation and velocities.  He will follow up with [name] at that time.  Thank you for allowing [name] to participate in this patient's care.  Adele Barthel, MD [facility] Office: [phone] Pager: [phone]

## 2011-07-22 ENCOUNTER — Ambulatory Visit (INDEPENDENT_AMBULATORY_CARE_PROVIDER_SITE_OTHER): Payer: Self-pay | Admitting: Infectious Diseases

## 2011-07-22 ENCOUNTER — Other Ambulatory Visit: Payer: Self-pay | Admitting: *Deleted

## 2011-07-22 ENCOUNTER — Encounter: Payer: Self-pay | Admitting: Infectious Diseases

## 2011-07-22 DIAGNOSIS — N189 Chronic kidney disease, unspecified: Secondary | ICD-10-CM

## 2011-07-22 DIAGNOSIS — B20 Human immunodeficiency virus [HIV] disease: Secondary | ICD-10-CM

## 2011-07-22 DIAGNOSIS — F411 Generalized anxiety disorder: Secondary | ICD-10-CM

## 2011-07-22 DIAGNOSIS — F419 Anxiety disorder, unspecified: Secondary | ICD-10-CM

## 2011-07-22 DIAGNOSIS — I10 Essential (primary) hypertension: Secondary | ICD-10-CM

## 2011-07-22 MED ORDER — DIAZEPAM 2 MG PO TABS: 2.0000 mg | ORAL_TABLET | Freq: Three times a day (TID) | ORAL | Status: DC | PRN

## 2011-07-22 MED ORDER — CARVEDILOL 3.125 MG PO TABS: 3.1250 mg | ORAL_TABLET | Freq: Two times a day (BID) | ORAL | Status: DC

## 2011-07-22 MED ORDER — AMLODIPINE BESYLATE 10 MG PO TABS: 10.0000 mg | ORAL_TABLET | Freq: Every day | ORAL | Status: DC

## 2011-07-22 NOTE — Progress Notes (Signed)
  Subjective:    Patient ID: Jeffrey [name], male    DOB: [birth year], 55 y.o.   MRN: [id number]  HPI 55 year old male with a history of hypertension and Gout, who was admitted on 06/02/2011 after a witnessed seizure at home. In the emergency room he was found to have a creatinine of 23. During his hospital workup he was also found to be HIV positive with a CD4 330 and VL 53,400 (genotype naive). He was also found to have Hep B DNA 537. His risk factor for acquisition was his known HIV partner (he is on ATVr/EPZ). He underwent a AV fistula formation and he received dialysis in the hospital. At his hospital f/u visit he was started on 3TC/ISN/TRV.  Has being doing well recently with his new ART. He has developed some swelling in his L knee. Mild pain. He has also developed some swelling of his lower lip. He has been on ART for 2 weeks. No SOB, no dysphagia. He has had some tingling of his lip as well.     Review of Systems     Objective:   Physical Exam  Constitutional: He appears well-developed and well-nourished.  Eyes: EOM are normal. Pupils are equal, round, and reactive to light.  Neck: Neck supple.  Cardiovascular: Normal rate, regular rhythm and normal heart sounds.   Pulmonary/Chest: Effort normal and breath sounds normal.  Abdominal: Soft. Bowel sounds are normal. There is no tenderness.  Lymphadenopathy:    He has no cervical adenopathy.  Skin:             Assessment & Plan:

## 2011-07-22 NOTE — Assessment & Plan Note (Signed)
He continues on tiw HD. My great appreciation to [facility]. His BP meds are refilled today.

## 2011-07-22 NOTE — Assessment & Plan Note (Signed)
He appears to be doing well. Some concern about his lip swelling although this could be HSV. Will have him back in 1 month to recheck his labs. He will call if he has any further issues with his lip, sob, dysphagia.

## 2011-07-22 NOTE — Assessment & Plan Note (Signed)
He asks to have his xanax changed to valium. Will change and give him low dose.

## 2011-08-08 ENCOUNTER — Other Ambulatory Visit: Payer: Self-pay | Admitting: Infectious Diseases

## 2011-08-18 ENCOUNTER — Telehealth: Payer: Self-pay | Admitting: *Deleted

## 2011-08-18 NOTE — Telephone Encounter (Signed)
Patient called advised he was given Valium and wants a refill if possible he also needs to change his appointment from 08/26/11 to 08/25/11 due to dialysis. Adv him will check with the provider and call him if Valium Rx is ok to refill.

## 2011-08-19 ENCOUNTER — Other Ambulatory Visit: Payer: Self-pay | Admitting: *Deleted

## 2011-08-19 DIAGNOSIS — F419 Anxiety disorder, unspecified: Secondary | ICD-10-CM

## 2011-08-19 MED ORDER — DIAZEPAM 2 MG PO TABS: 2.0000 mg | ORAL_TABLET | Freq: Three times a day (TID) | ORAL | Status: DC | PRN

## 2011-08-20 ENCOUNTER — Encounter: Payer: Self-pay | Admitting: Vascular Surgery

## 2011-08-21 ENCOUNTER — Ambulatory Visit (INDEPENDENT_AMBULATORY_CARE_PROVIDER_SITE_OTHER): Payer: Medicaid Other | Admitting: Vascular Surgery

## 2011-08-21 ENCOUNTER — Other Ambulatory Visit (INDEPENDENT_AMBULATORY_CARE_PROVIDER_SITE_OTHER): Payer: Medicaid Other | Admitting: *Deleted

## 2011-08-21 ENCOUNTER — Encounter: Payer: Self-pay | Admitting: Vascular Surgery

## 2011-08-21 VITALS — BP 106/68 | HR 79 | Resp 16 | Ht 72.0 in | Wt 195.0 lb

## 2011-08-21 DIAGNOSIS — N186 End stage renal disease: Secondary | ICD-10-CM

## 2011-08-21 NOTE — Progress Notes (Signed)
[facility] [place]  Postoperative Access Visit  History of Present Illness  Jeffrey [name] is a 55 y.o. year old male who presents for postoperative follow-up duplex of L RC AVF (Date: 06/02/11). The patient's wounds are healed. The patient notes no steal symptoms. The patient is able to complete their activities of daily living. The patient's current symptoms are: none.   Physical Examination  Filed Vitals:   08/21/11 1523  BP: 106/68  Pulse: 79  Resp: 16  Height: 6' (1.829 m)  Weight: 195 lb (88.451 kg)  SpO2: 97%   LUE: Incision is healed, skin feels warm, hand grip is 5/5, sensation in digits is intact, palpable thrill, bruit can be auscultated  LUE Access Duplex (123456): forearm cephalic: 5.7 123XX123 mm, few side branches along main cephalic branch, at antecubital multiple branches noted  Medical Decision Making  Jeffrey [name] is a 55 y.o. year old male who presents s/p L RC AVF.  The L RC AVF should be ready for use. The [facility] can be removed after two successful cannulations. Thank you for allowing [name] to participate in this patient's care.  Adele Barthel, MD [facility] [place] Office: [phone] Pager: [phone]  08/21/2011, 5:22 PM

## 2011-08-25 ENCOUNTER — Ambulatory Visit: Payer: Self-pay | Admitting: Infectious Diseases

## 2011-08-26 ENCOUNTER — Encounter: Payer: Self-pay | Admitting: Infectious Diseases

## 2011-08-26 ENCOUNTER — Ambulatory Visit (INDEPENDENT_AMBULATORY_CARE_PROVIDER_SITE_OTHER): Payer: Medicare Other | Admitting: Infectious Diseases

## 2011-08-26 VITALS — BP 114/40 | HR 96 | Temp 98.8°F | Ht 72.0 in | Wt 198.8 lb

## 2011-08-26 DIAGNOSIS — N186 End stage renal disease: Secondary | ICD-10-CM

## 2011-08-26 DIAGNOSIS — B20 Human immunodeficiency virus [HIV] disease: Secondary | ICD-10-CM

## 2011-08-26 NOTE — Assessment & Plan Note (Signed)
He appears to be doing well. Will recheck his CD4 and VL today. He states he has been adherent to his ART. He received his flu and pnvx at his previous hospitalization. Return to clinic 3 months

## 2011-08-26 NOTE — Progress Notes (Signed)
  Subjective:    Patient ID: Jeffrey [name], male    DOB: 03-Dec-1955, 55 y.o.   MRN: [id number]  HPI 55 year old male with a history of hypertension and Gout, who was admitted on 06/02/2011 after a witnessed seizure at home. In the emergency room he was found to have a creatinine of 23. During his hospital workup he was also found to be HIV positive with a CD4 330 and VL 53,400 (genotype naive). He was also found to have Hep B DNA 537. His risk factor for acquisition was his known HIV partner (he is on ATVr/EPZ). He underwent a AV fistula formation and he received dialysis in the hospital. He was started on 3TC/ISN/TRV.  He has been having problems with hypotension around his HD sessions. Has stopped his norvasc due to this. Has had fistula accessed at HD and is having increased anxiety from this.  Review of Systems  Constitutional: Negative for appetite change and unexpected weight change.  Respiratory: Negative for shortness of breath.   Cardiovascular: Negative for leg swelling.  Gastrointestinal: Negative for diarrhea and constipation.  Genitourinary: Positive for decreased urine volume.       Objective:   Physical Exam  Constitutional: He appears well-developed and well-nourished.  HENT:  Mouth/Throat: Oropharynx is clear and moist. No oropharyngeal exudate.  Eyes: EOM are normal. Pupils are equal, round, and reactive to light.  Neck: Neck supple.  Cardiovascular: Normal rate, regular rhythm and normal heart sounds.   Pulmonary/Chest: Effort normal and breath sounds normal.    Abdominal: Soft. Bowel sounds are normal. There is no tenderness.  Lymphadenopathy:    He has no cervical adenopathy.  Skin:             Assessment & Plan:

## 2011-08-26 NOTE — Assessment & Plan Note (Signed)
He appears to be doing well with this as well. He will take a extra valium before his fistula is accessed ( he is afraid of the needle).

## 2011-08-27 ENCOUNTER — Inpatient Hospital Stay (HOSPITAL_COMMUNITY)
Admission: EM | Admit: 2011-08-27 | Discharge: 2011-08-29 | DRG: 193 | Disposition: A | Discharge status: Home / Self Care | Payer: Medicare Other | Attending: Internal Medicine | Admitting: Internal Medicine

## 2011-08-27 ENCOUNTER — Emergency Department (HOSPITAL_COMMUNITY): Payer: Medicare Other

## 2011-08-27 ENCOUNTER — Encounter (HOSPITAL_COMMUNITY): Payer: Self-pay | Admitting: Emergency Medicine

## 2011-08-27 DIAGNOSIS — Z79899 Other long term (current) drug therapy: Secondary | ICD-10-CM

## 2011-08-27 DIAGNOSIS — R509 Fever, unspecified: Secondary | ICD-10-CM

## 2011-08-27 DIAGNOSIS — J09X2 Influenza due to identified novel influenza A virus with other respiratory manifestations: Principal | ICD-10-CM | POA: Diagnosis present

## 2011-08-27 DIAGNOSIS — D631 Anemia in chronic kidney disease: Secondary | ICD-10-CM | POA: Diagnosis present

## 2011-08-27 DIAGNOSIS — M109 Gout, unspecified: Secondary | ICD-10-CM | POA: Diagnosis present

## 2011-08-27 DIAGNOSIS — N2581 Secondary hyperparathyroidism of renal origin: Secondary | ICD-10-CM | POA: Diagnosis present

## 2011-08-27 DIAGNOSIS — Z87891 Personal history of nicotine dependence: Secondary | ICD-10-CM

## 2011-08-27 DIAGNOSIS — I120 Hypertensive chronic kidney disease with stage 5 chronic kidney disease or end stage renal disease: Secondary | ICD-10-CM | POA: Diagnosis present

## 2011-08-27 DIAGNOSIS — R7402 Elevation of levels of lactic acid dehydrogenase (LDH): Secondary | ICD-10-CM | POA: Diagnosis present

## 2011-08-27 DIAGNOSIS — R7401 Elevation of levels of liver transaminase levels: Secondary | ICD-10-CM | POA: Diagnosis present

## 2011-08-27 DIAGNOSIS — Z6825 Body mass index (BMI) 25.0-25.9, adult: Secondary | ICD-10-CM

## 2011-08-27 DIAGNOSIS — N039 Chronic nephritic syndrome with unspecified morphologic changes: Secondary | ICD-10-CM | POA: Diagnosis present

## 2011-08-27 DIAGNOSIS — B20 Human immunodeficiency virus [HIV] disease: Secondary | ICD-10-CM

## 2011-08-27 DIAGNOSIS — N186 End stage renal disease: Secondary | ICD-10-CM | POA: Diagnosis present

## 2011-08-27 DIAGNOSIS — J101 Influenza due to other identified influenza virus with other respiratory manifestations: Secondary | ICD-10-CM | POA: Diagnosis present

## 2011-08-27 DIAGNOSIS — G40909 Epilepsy, unspecified, not intractable, without status epilepticus: Secondary | ICD-10-CM | POA: Diagnosis present

## 2011-08-27 DIAGNOSIS — F419 Anxiety disorder, unspecified: Secondary | ICD-10-CM

## 2011-08-27 DIAGNOSIS — F411 Generalized anxiety disorder: Secondary | ICD-10-CM | POA: Diagnosis present

## 2011-08-27 LAB — DIFFERENTIAL
Eosinophils Relative: 3 % (ref 0–5)
Lymphocytes Relative: 39 % (ref 12–46)
Monocytes Absolute: 0.8 10*3/uL (ref 0.1–1.0)
Monocytes Relative: 13 % — ABNORMAL HIGH (ref 3–12)
Neutro Abs: 3 10*3/uL (ref 1.7–7.7)

## 2011-08-27 LAB — CBC
HCT: 34.9 % — ABNORMAL LOW (ref 39.0–52.0)
Hemoglobin: 11.2 g/dL — ABNORMAL LOW (ref 13.0–17.0)
MCV: 92.6 fL (ref 78.0–100.0)
WBC: 6.5 10*3/uL (ref 4.0–10.5)

## 2011-08-27 LAB — BASIC METABOLIC PANEL
Calcium: 9.8 mg/dL (ref 8.4–10.5)
Creatinine, Ser: 8.91 mg/dL — ABNORMAL HIGH (ref 0.50–1.35)
GFR calc non Af Amer: 6 mL/min — ABNORMAL LOW (ref >90)
Glucose, Bld: 98 mg/dL (ref 70–99)
Sodium: 136 mEq/L (ref 135–145)

## 2011-08-27 LAB — T-HELPER CELL (CD4) - (RCID CLINIC ONLY): CD4 % Helper T Cell: 14 % — ABNORMAL LOW (ref 33–55)

## 2011-08-27 MED ORDER — SODIUM CHLORIDE 0.9 % IV SOLN
Freq: Once | INTRAVENOUS | Status: AC
Start: 2011-08-27 — End: 2011-08-27
  Administered 2011-08-27: 22:00:00 via INTRAVENOUS

## 2011-08-27 MED ORDER — ACETAMINOPHEN 325 MG PO TABS
650.0000 mg | ORAL_TABLET | Freq: Once | ORAL | Status: AC
  Administered 2011-08-27: 650 mg via ORAL
  Filled 2011-08-27: qty 2

## 2011-08-27 NOTE — ED Provider Notes (Signed)
Medical screening examination/treatment/procedure(s) were performed by non-physician practitioner and as supervising physician I was immediately available for consultation/collaboration.   Maudry Diego, MD 08/27/11 [phone]

## 2011-08-27 NOTE — Procedures (Unsigned)
VASCULAR LAB EXAM  INDICATION:  End-stage renal disease.  HISTORY: Diabetes: Cardiac: Hypertension:  EXAM:  Left arm arteriovenous fistula duplex.  IMPRESSION: 1. Patent left radial to cephalic arteriovenous fistula with no     evidence of internal narrowing noted at the anastomosis level or     throughout the outflow vein. 2. The left radial artery at the wrist level demonstrates antegrade     flow. 3. Depth, diameter, velocity, and patent outflow vein branch     measurements are noted on the attached worksheet.  ___________________________________________ Conrad , MD  CH/MEDQ  D:  08/25/2011  T:  08/25/2011  Job:  [id number]

## 2011-08-27 NOTE — ED Provider Notes (Signed)
History     CSN: [id number] Arrival date & time: 08/27/2011  7:50 PM   First MD Initiated Contact with Patient 08/27/11 2052      Chief Complaint  Patient presents with  . Fever    (Consider location/radiation/quality/duration/timing/severity/associated sxs/prior treatment) HPI Comments: Jeffrey [name] reports having dialysis today and chills noted to have a fever of 103.  Also complaining of a slight cough, shortness of breath, and general myalgias.  He does have a Vas-Cath in the right subclavian area that he is being dialyzed through an a new Gore-Tex graft in his left forearm with a positive thrill with this patient's immune no compromised status to to his HIV.  This fever is concerning  Patient is a 55 y.o. male presenting with fever. The history is provided by the patient.  Fever Primary symptoms of the febrile illness include fever, cough, shortness of breath and myalgias. Primary symptoms do not include nausea, vomiting or diarrhea. The current episode started today. This is a new problem.  The fever began today. The fever has been gradually worsening since its onset. The maximum temperature recorded prior to his arrival was 103 to 104 F.  The cough began today. The cough is non-productive.  The shortness of breath began today. The shortness of breath developed suddenly. The shortness of breath is severe.  The myalgias are not associated with weakness.  Risk factors for febrile illness include HIV.   Past Medical History  Diagnosis Date  . Hypertension   . Gout   . Anemia   . Seizures 06/02/2011  . Thrombocytopenia   . Chronic kidney disease     ESRD requiring hemodialysis  . Anxiety   . Hepatitis B carrier   . HIV infection   . Hemodialysis patient     Past Surgical History  Procedure Date  . Av fistula placement 06/02/11    Left radiocephalic AVF    Family History  Problem Relation Age of Onset  . Hypertension Mother   . Cancer Father     History  Substance  Use Topics  . Smoking status: Former Smoker    Types: Cigarettes    Quit date: 06/03/2011  . Smokeless tobacco: Never Used  . Alcohol Use: No      Review of Systems  Constitutional: Positive for fever.  HENT: Negative.   Eyes: Negative.   Respiratory: Positive for cough and shortness of breath.   Cardiovascular: Negative for chest pain.  Gastrointestinal: Negative for nausea, vomiting and diarrhea.  Musculoskeletal: Positive for myalgias.  Neurological: Negative for dizziness and weakness.  Hematological: Negative.   Psychiatric/Behavioral: Negative.     Allergies  Penicillins  Home Medications   Current Outpatient Rx  Name Route Sig Dispense Refill  . ALLOPURINOL 300 MG PO TABS Oral Take 300 mg by mouth daily.      Marland Kitchen ALPRAZOLAM 0.5 MG PO TABS Oral Take 0.5 mg by mouth 2 (two) times daily as needed. For anxiety     . CALCIUM ACETATE 667 MG PO TABS Oral Take 2 tablets by mouth 3 (three) times daily with meals.      Marland Kitchen CALCIUM GLUCONATE 500 MG PO TABS Oral Take 500 mg by mouth daily.      Marland Kitchen CARVEDILOL 3.125 MG PO TABS Oral Take 1 tablet (3.125 mg total) by mouth 2 (two) times daily with a meal. 180 tablet 3  . DIAZEPAM 2 MG PO TABS Oral Take 2 mg by mouth every 8 (eight) hours as needed. For  anxiety     . LAMIVUDINE 5 MG/ML PO SOLN  50mg  on day one and then 25mg  daily afterwards. 240 mL 5  . LIDOCAINE 5 % EX OINT Topical Apply 1 application topically daily.      Marland Kitchen LIDOCAINE-PRILOCAINE 2.5-2.5 % EX CREA Topical Apply 1 application topically daily.      Marland Kitchen RENA-VITE PO TABS Oral Take 1 tablet by mouth daily.      Marland Kitchen PSEUDOEPHEDRINE-APAP-DM 30-325-15 MG PO TABS Oral Take 15 mLs by mouth every 6 (six) hours as needed. For cold symptoms     . RALTEGRAVIR POTASSIUM 400 MG PO TABS Oral Take 400 mg by mouth 2 (two) times daily.      . TENOFOVIR DISOPROXIL FUMARATE 300 MG PO TABS Oral Take 1 tablet (300 mg total) by mouth once a week. 5 tablet 11    One pill weekly after dailysis     BP 103/70  Pulse 107  Temp(Src) 100.1 F (37.8 C) (Oral)  Resp 16  SpO2 98%  Physical Exam  Constitutional: He is oriented to person, place, and time. He appears well-developed.  HENT:  Head: Normocephalic.  Eyes: Conjunctivae are normal.  Neck: Normal range of motion.  Cardiovascular: Normal rate.        Vas cath R subclavian area without sign of infection no erythmia  Pulmonary/Chest: Effort normal.  Abdominal: Soft.  Musculoskeletal: Normal range of motion.  Neurological: He is oriented to person, place, and time.  Skin: Skin is warm. No rash noted.  Psychiatric: He has a normal mood and affect.    ED Course  Procedures (including critical care time)  Labs Reviewed  CBC - Abnormal; Notable for the following:    RBC 3.77 (*)    Hemoglobin 11.2 (*)    HCT 34.9 (*)    RDW 16.2 (*)    Platelets 126 (*)    All other components within normal limits  DIFFERENTIAL - Abnormal; Notable for the following:    Monocytes Relative 13 (*)    All other components within normal limits  BASIC METABOLIC PANEL - Abnormal; Notable for the following:    Chloride 95 (*)    BUN 34 (*)    Creatinine, Ser 8.91 (*)    GFR calc non Af Amer 6 (*)    GFR calc Af Amer 7 (*)    All other components within normal limits  INFLUENZA PANEL BY PCR   Dg Chest 2 View  08/27/2011  *RADIOLOGY REPORT*  Clinical Data: Post dialysis today.  Weakness, fever and body aches.  CHEST - 2 VIEW  Comparison: 06/14/2011 and 06/06/2011 radiographs.  Findings: Right IJ dialysis catheters are unchanged near the SVC right atrial junction.  Heart size and mediastinal contours are stable.  There is diffuse central airway thickening with stable asymmetric scarring at the right lung base.  No definite superimposed airspace disease, edema or pleural effusion is identified.  IMPRESSION: Stable chronic lung disease.  No acute cardiopulmonary process evident.  Original Report Authenticated By: Vivia Ewing, M.D.      1. Fever and chills   2. End stage renal disease   3. Human immunodeficiency virus (HIV) disease       MDM  Will perform nasal swab for influenza on this patient most likely this is flu syndrome in an immunocompromised patient.  Will consider admission .        Garald Balding, NP 08/27/11 2127  Garald Balding, NP 08/27/11 [place]  Olean Ree, NP 08/27/11 2348

## 2011-08-27 NOTE — ED Notes (Signed)
PT. REPORTS FEVER WITH DRY COUGH ONSET 2 DAYS AGO ,  SL[place]S .

## 2011-08-28 ENCOUNTER — Encounter (HOSPITAL_COMMUNITY): Payer: Self-pay | Admitting: *Deleted

## 2011-08-28 ENCOUNTER — Other Ambulatory Visit: Payer: Self-pay

## 2011-08-28 DIAGNOSIS — R509 Fever, unspecified: Secondary | ICD-10-CM | POA: Diagnosis present

## 2011-08-28 DIAGNOSIS — R569 Unspecified convulsions: Secondary | ICD-10-CM | POA: Insufficient documentation

## 2011-08-28 DIAGNOSIS — N186 End stage renal disease: Secondary | ICD-10-CM

## 2011-08-28 LAB — COMPREHENSIVE METABOLIC PANEL
ALT: 207 U/L — ABNORMAL HIGH (ref 0–53)
Alkaline Phosphatase: 111 U/L (ref 39–117)
CO2: 28 mEq/L (ref 19–32)
Calcium: 9.4 mg/dL (ref 8.4–10.5)
GFR calc Af Amer: 5 mL/min — ABNORMAL LOW (ref >90)
GFR calc non Af Amer: 5 mL/min — ABNORMAL LOW (ref >90)
Glucose, Bld: 88 mg/dL (ref 70–99)
Sodium: 134 mEq/L — ABNORMAL LOW (ref 135–145)

## 2011-08-28 LAB — CBC
Hemoglobin: 10.3 g/dL — ABNORMAL LOW (ref 13.0–17.0)
MCH: 30.2 pg (ref 26.0–34.0)
RBC: 3.41 MIL/uL — ABNORMAL LOW (ref 4.22–5.81)

## 2011-08-28 LAB — HIV-1 RNA ULTRAQUANT REFLEX TO GENTYP+: HIV 1 RNA Quant: <20 copies/mL (ref <20)

## 2011-08-28 LAB — INFLUENZA PANEL BY PCR (TYPE A & B): Influenza B By PCR: NEGATIVE

## 2011-08-28 MED ORDER — DOCUSATE SODIUM 100 MG PO CAPS
100.0000 mg | ORAL_CAPSULE | Freq: Two times a day (BID) | ORAL | Status: DC
  Administered 2011-08-28 – 2011-08-29 (×3): 100 mg via ORAL
  Filled 2011-08-28 (×4): qty 1

## 2011-08-28 MED ORDER — ALBUTEROL SULFATE (5 MG/ML) 0.5% IN NEBU
2.5000 mg | INHALATION_SOLUTION | Freq: Four times a day (QID) | RESPIRATORY_TRACT | Status: DC | PRN
Start: 2011-08-28 — End: 2011-08-28
  Administered 2011-08-28: 2.5 mg via RESPIRATORY_TRACT
  Filled 2011-08-28: qty 0.5

## 2011-08-28 MED ORDER — KIDNEY FAILURE BOOK
Freq: Once | Status: AC
  Administered 2011-08-28: 06:00:00
  Filled 2011-08-28: qty 1

## 2011-08-28 MED ORDER — RALTEGRAVIR POTASSIUM 400 MG PO TABS
400.0000 mg | ORAL_TABLET | Freq: Two times a day (BID) | ORAL | Status: DC
  Administered 2011-08-28 – 2011-08-29 (×3): 400 mg via ORAL
  Filled 2011-08-28 (×4): qty 1

## 2011-08-28 MED ORDER — RENA-VITE PO TABS
1.0000 | ORAL_TABLET | Freq: Every day | ORAL | Status: DC
  Administered 2011-08-28 – 2011-08-29 (×2): 1 via ORAL
  Filled 2011-08-28 (×2): qty 1

## 2011-08-28 MED ORDER — TENOFOVIR DISOPROXIL FUMARATE 300 MG PO TABS: 300.0000 mg | ORAL_TABLET | ORAL | Status: DC

## 2011-08-28 MED ORDER — ACETAMINOPHEN 650 MG RE SUPP: 650.0000 mg | Freq: Four times a day (QID) | RECTAL | Status: DC | PRN

## 2011-08-28 MED ORDER — SODIUM CHLORIDE 0.9 % IJ SOLN
3.0000 mL | Freq: Two times a day (BID) | INTRAMUSCULAR | Status: DC
  Administered 2011-08-28 (×2): 3 mL via INTRAVENOUS

## 2011-08-28 MED ORDER — RALTEGRAVIR POTASSIUM 400 MG PO TABS: 400.0000 mg | ORAL_TABLET | Freq: Two times a day (BID) | ORAL | Status: DC

## 2011-08-28 MED ORDER — ACETAMINOPHEN 325 MG PO TABS: 650.0000 mg | ORAL_TABLET | Freq: Four times a day (QID) | ORAL | Status: DC | PRN

## 2011-08-28 MED ORDER — OSELTAMIVIR PHOSPHATE 75 MG PO CAPS
75.0000 mg | ORAL_CAPSULE | Freq: Every day | ORAL | Status: DC
  Administered 2011-08-28 – 2011-08-29 (×2): 75 mg via ORAL
  Filled 2011-08-28 (×4): qty 1

## 2011-08-28 MED ORDER — ALBUTEROL SULFATE HFA 108 (90 BASE) MCG/ACT IN AERS
2.0000 | INHALATION_SPRAY | RESPIRATORY_TRACT | Status: DC | PRN
  Administered 2011-08-29: 2 via RESPIRATORY_TRACT
  Filled 2011-08-28: qty 6.7

## 2011-08-28 MED ORDER — CALCIUM GLUCONATE 500 MG PO TABS
500.0000 mg | ORAL_TABLET | Freq: Every day | ORAL | Status: DC
  Administered 2011-08-28 – 2011-08-29 (×2): 500 mg via ORAL
  Filled 2011-08-28 (×2): qty 1

## 2011-08-28 MED ORDER — HEPARIN SODIUM (PORCINE) 1000 UNIT/ML DIALYSIS
100.0000 [IU]/kg | INTRAMUSCULAR | Status: DC | PRN
  Administered 2011-08-29: 8900 [IU] via INTRAVENOUS_CENTRAL
  Filled 2011-08-28: qty 9

## 2011-08-28 MED ORDER — ONDANSETRON HCL 4 MG PO TABS: 4.0000 mg | ORAL_TABLET | Freq: Four times a day (QID) | ORAL | Status: DC | PRN

## 2011-08-28 MED ORDER — ALPRAZOLAM 0.5 MG PO TABS
0.5000 mg | ORAL_TABLET | Freq: Two times a day (BID) | ORAL | Status: DC | PRN
  Administered 2011-08-28 – 2011-08-29 (×2): 0.5 mg via ORAL
  Filled 2011-08-28 (×2): qty 1

## 2011-08-28 MED ORDER — SODIUM CHLORIDE 0.9 % IV SOLN: 250.0000 mL | INTRAVENOUS | Status: DC | PRN

## 2011-08-28 MED ORDER — ONDANSETRON HCL 4 MG/2ML IJ SOLN: 4.0000 mg | Freq: Four times a day (QID) | INTRAMUSCULAR | Status: DC | PRN

## 2011-08-28 MED ORDER — SODIUM CHLORIDE 0.9 % IJ SOLN: 3.0000 mL | INTRAMUSCULAR | Status: DC | PRN

## 2011-08-28 MED ORDER — CALCIUM ACETATE 667 MG PO TABS: 2.0000 | ORAL_TABLET | Freq: Three times a day (TID) | ORAL | Status: DC

## 2011-08-28 MED ORDER — LAMIVUDINE 10 MG/ML PO SOLN
25.0000 mg | Freq: Every day | ORAL | Status: DC
  Administered 2011-08-28 – 2011-08-29 (×2): 25 mg via ORAL
  Filled 2011-08-28 (×2): qty 5

## 2011-08-28 MED ORDER — ALLOPURINOL 300 MG PO TABS
300.0000 mg | ORAL_TABLET | Freq: Every day | ORAL | Status: DC
  Administered 2011-08-28 – 2011-08-29 (×2): 300 mg via ORAL
  Filled 2011-08-28 (×2): qty 1

## 2011-08-28 MED ORDER — GUAIFENESIN-DM 100-10 MG/5ML PO SYRP
5.0000 mL | ORAL_SOLUTION | Freq: Four times a day (QID) | ORAL | Status: DC | PRN
  Administered 2011-08-29: 5 mL via ORAL
  Filled 2011-08-28: qty 5

## 2011-08-28 MED ORDER — HEPARIN SODIUM (PORCINE) 5000 UNIT/ML IJ SOLN
5000.0000 [IU] | Freq: Three times a day (TID) | INTRAMUSCULAR | Status: DC
  Administered 2011-08-28 – 2011-08-29 (×3): 5000 [IU] via SUBCUTANEOUS
  Filled 2011-08-28 (×7): qty 1

## 2011-08-28 MED ORDER — OSELTAMIVIR PHOSPHATE 75 MG PO CAPS: 75.0000 mg | ORAL_CAPSULE | Freq: Two times a day (BID) | ORAL | Status: DC

## 2011-08-28 MED ORDER — ALBUTEROL SULFATE (5 MG/ML) 0.5% IN NEBU
2.5000 mg | INHALATION_SOLUTION | Freq: Four times a day (QID) | RESPIRATORY_TRACT | Status: DC
  Administered 2011-08-28: 2.5 mg via RESPIRATORY_TRACT
  Filled 2011-08-28 (×2): qty 0.5

## 2011-08-28 MED ORDER — ALUM & MAG HYDROXIDE-SIMETH 200-200-20 MG/5ML PO SUSP
30.0000 mL | Freq: Four times a day (QID) | ORAL | Status: DC | PRN
  Filled 2011-08-28: qty 30

## 2011-08-28 MED ORDER — CALCIUM ACETATE 667 MG PO CAPS
1334.0000 mg | ORAL_CAPSULE | Freq: Three times a day (TID) | ORAL | Status: DC
  Administered 2011-08-28 – 2011-08-29 (×3): 1334 mg via ORAL
  Filled 2011-08-28 (×7): qty 2

## 2011-08-28 MED ORDER — OSELTAMIVIR PHOSPHATE 75 MG PO CAPS
75.0000 mg | ORAL_CAPSULE | ORAL | Status: AC
  Filled 2011-08-28: qty 1

## 2011-08-28 NOTE — Progress Notes (Signed)
[facility] Catheter Removal Procedure Note  Diagnosis: ESRD with Functioning AVF/AVGG and probable infected catheter  Plan:  Remove right diatek catheter  Consent signed:  yes Time out completed:  yes Coumadin:  no PT/INR (if applicable):   Other labs:   Procedure: 1.  Sterile prepping and draping over catheter area 2. 8 ml 2% lidocaine plain instilled at removal site. 3.  right catheter removed in its entirety with cuff in tact. 4.  Complications: none  5. Tip of catheter sent for culture:  yes   Patient tolerated procedure well:  yes Pressure held, no bleeding noted, dressing applied Instructions given to the pt regarding wound care and bleeding.  Catheter Removal INSTRUCTIONS   Please sit up at least 30 degrees for 4 hours then may lay flat   MAY REMOVE DRESSINGS IN AM AND MAY SHOWER   REPLACE DRESSING OVER EXIT SITE AS NEEDED   IF YOU SHOULD NOTE BLEEDING OR SWELLING AT THE NECK HOLD PRESSURE OVER THE DRESSINGS FOR 15 MINUTES, IF BLEEDING PERSISTS COME TO ER OR CALL [place] J 08/28/2011 4:39 PM

## 2011-08-28 NOTE — Progress Notes (Signed)
Subjective:  Patient feels much better. Mild cough, no chest pain.  Objective: Vital signs in last 24 hours: Filed Vitals:   08/28/11 0255 08/28/11 0300 08/28/11 0530 08/28/11 1000  BP: 94/53 100/68 104/63 100/61  Pulse:  99 93 79  Temp:  99.1 F (37.3 C) 100 F (37.8 C) 99.3 F (37.4 C)  TempSrc:  Oral Oral Oral  Resp:  18 16 18   Height:  6' (1.829 m)    Weight:  195 lb 5.2 oz (88.6 kg)    SpO2:  94% 100% 100%   Weight change:   Intake/Output Summary (Last 24 hours) at 08/28/11 1210 Last data filed at 08/28/11 [id number]  Gross per 24 hour  Intake    480 ml  Output      0 ml  Net    480 ml   Physical Exam:  General: resting in bed, not in acute distress HEENT: PERRL, EOMI, no scleral icterus Cardiac: S1/S2, RRR, No murmurs, gallops or rubs Pulm: Good air movement bilaterally, Clear to auscultation bilaterally, No rales, wheezing, rhonchi or rubs. Abd: Soft,  nondistended, nontender, no rebound pain, no organomegaly, BS present Ext: No rashes or edema, 2+DP/PT pulse bilaterally Neuro: alert and oriented X3, cranial nerves II-XII grossly intact, muscle strength 5/5 in all extremeties,  sensation to light touch intact.    Lab Results: Basic Metabolic Panel:  Lab 0000000 0940 08/27/11 2010  NA 134* 136  K 4.6 4.7  CL 95* 95*  CO2 28 31  GLUCOSE 88 98  BUN 46* 34*  CREATININE 10.77* 8.91*  CALCIUM 9.4 9.8  MG -- --  PHOS -- --   Liver Function Tests:  Lab 08/28/11 0940  AST 133*  ALT 207*  ALKPHOS 111  BILITOT 0.3  PROT 9.0*  ALBUMIN 2.7*    Lab 08/28/11 0940 08/27/11 2010  WBC 6.6 6.5  NEUTROABS -- 3.0  HGB 10.3* 11.2*  HCT 31.9* 34.9*  MCV 93.5 92.6  PLT 105* 126*   Urine Drug Screen: Drugs of Abuse     Component Value Date/Time   LABOPIA NONE DETECTED 06/02/2011 0948   COCAINSCRNUR POSITIVE* 06/02/2011 0948   LABBENZ NONE DETECTED 06/02/2011 0948   AMPHETMU NONE DETECTED 06/02/2011 0948   THCU NONE DETECTED 06/02/2011 0948   LABBARB NONE  DETECTED 06/02/2011 0948     Micro Results:  Influenza A positive Influenza B negative H1N1: not detected  Studies/Results: Dg Chest 2 View  08/27/2011  *RADIOLOGY REPORT*  Clinical Data: Post dialysis today.  Weakness, fever and body aches.  CHEST - 2 VIEW  Comparison: 06/14/2011 and 06/06/2011 radiographs.  Findings: Right IJ dialysis catheters are unchanged near the SVC right atrial junction.  Heart size and mediastinal contours are stable.  There is diffuse central airway thickening with stable asymmetric scarring at the right lung base.  No definite superimposed airspace disease, edema or pleural effusion is identified.  IMPRESSION: Stable chronic lung disease.  No acute cardiopulmonary process evident.  Original Report Authenticated By: Vivia Ewing, M.D.   Medications:   Scheduled Meds:   . sodium chloride   Intravenous Once  . acetaminophen  650 mg Oral Once  . albuterol  2.5 mg Nebulization Q6H  . allopurinol  300 mg Oral Daily  . calcium acetate  1,334 mg Oral TID WC  . calcium gluconate  500 mg Oral Daily  . docusate sodium  100 mg Oral BID  . heparin  5,000 Units Subcutaneous Q8H  . kidney failure book  Does not apply Once  . lamiVUDine  25 mg Oral Daily  . multivitamin  1 tablet Oral Daily  . oseltamivir  75 mg Oral To Minor  . oseltamivir  75 mg Oral Daily  . raltegravir  400 mg Oral BID  . sodium chloride  3 mL Intravenous Q12H  . tenofovir  300 mg Oral Weekly  . DISCONTD: Calcium Acetate  2 tablet Oral TID WC  . DISCONTD: oseltamivir  75 mg Oral BID  . DISCONTD: raltegravir  400 mg Oral BID   Continuous Infusions:  PRN Meds:.sodium chloride, acetaminophen, acetaminophen, ALPRAZolam, alum & mag hydroxide-simeth, ondansetron (ZOFRAN) IV, ondansetron, sodium chloride  Assessment/Plan:  The patient is a 55 yo man, history of HIV (CD4 = 400, VL = 53400), HBV, ESRD on HD, presenting with fever.   Fever - resolving. Mostly likely due to Influenza A. Patient  feels better. Only mild cough, not chest pain. His lung auscultation is clear.  His Influenza A test came back positive with negative B and H1N1.  -will contine tamiflu  -tylenol for symptomatic relief  -will treat with Albuterol neb -obtaining blood cultures, sputum cultures   2. HIV - CD4 = 400 (08/26/11), VL = 53400 (06/04/11), controlled with ART.  -continue ART  3. ESRD on HD - on Tues/Thurs/Sat dialysis,   -renal consulted, will get HD tomorrow  4. Anemia of Chronic Disease - likely due to ESRD, last ferritin = 333 (06/02/11), Hb at baseline  -continue to monitor   5. HTN - SBP in 100's-110's  -holding coreg acutely  -amlodipine d/c'ed by Dr. Johnnye Sima  6. Anxiety - chronic, stable  -continue xanax   7. Elevated transaminase: AST=133, ALT= 207. Most likely due to HVB. Patient's HBV surface antigen and Core antigen were positive on 06-02-11.  Patient is asymptomatic now. Will monitor.  8. Prophy - Heparin    Elevated AST/ALT:   LOS: 1 day   Ivor [name] 08/28/2011, 12:10 PM

## 2011-08-28 NOTE — ED Notes (Signed)
Pt hypo tensive. bp taken 3 differnet times with 2 diff. Cuffs and 63/39 last one taken. Pt stated that he had dialysis Thurs. And stated that he had 3.5 l removed. Iv bolus given. Pt denies being symptomatic. Stated that he gets dizzy and nauseated when bp low. Stated that he feels "fine". Still awaiting Tamiflu from pharmacy.

## 2011-08-28 NOTE — Progress Notes (Signed)
08/28/2011 3:11 PM   Received request to remove diatek catheter.  We will order supplies, consent and do the procedure at the bedside today or tomorrow.  Thank you  Camyla Camposano J PA-C

## 2011-08-28 NOTE — Progress Notes (Signed)
I left message with [facility] to remove PermCath.

## 2011-08-28 NOTE — H&P (Signed)
Internal Medicine Teaching Service Attending Note Date: 08/28/2011  Patient name: Jeffrey [name]  Medical record number: [id number]  Date of birth: Sep 27, 1955   I have seen and evaluated Jeffrey [name] and discussed their care with the Residency Team.  Patient admitted with symptoms very suggestive of flu. The only symptom he doesn't have is myalgias. He is feeling better this AM post rest and IVF.  Physical Exam: Blood pressure 104/63, pulse 93, temperature 100 F (37.8 C), temperature source Oral, resp. rate 16, height 6' (1.829 m), weight 195 lb 5.2 oz (88.6 kg), SpO2 100.00%. No LN, no JVD Lungs-clear, some congestion heard in throat Heart-RRR, no extra sounds Abdom- +BS, NT Extrem-no edema Neuro-non-focal  Lab results: Results for orders placed during the hospital encounter of 08/27/11 (from the past 24 hour(s))  CBC     Status: Abnormal   Collection Time   08/27/11  8:10 PM      Component Value Range   WBC 6.5  4.0 - 10.5 (K/uL)   RBC 3.77 (*) 4.22 - 5.81 (MIL/uL)   Hemoglobin 11.2 (*) 13.0 - 17.0 (g/dL)   HCT 34.9 (*) 39.0 - 52.0 (%)   MCV 92.6  78.0 - 100.0 (fL)   MCH 29.7  26.0 - 34.0 (pg)   MCHC 32.1  30.0 - 36.0 (g/dL)   RDW 16.2 (*) 11.5 - 15.5 (%)   Platelets 126 (*) 150 - 400 (K/uL)  DIFFERENTIAL     Status: Abnormal   Collection Time   08/27/11  8:10 PM      Component Value Range   Neutrophils Relative 45  43 - 77 (%)   Neutro Abs 3.0  1.7 - 7.7 (K/uL)   Lymphocytes Relative 39  12 - 46 (%)   Lymphs Abs 2.6  0.7 - 4.0 (K/uL)   Monocytes Relative 13 (*) 3 - 12 (%)   Monocytes Absolute 0.8  0.1 - 1.0 (K/uL)   Eosinophils Relative 3  0 - 5 (%)   Eosinophils Absolute 0.2  0.0 - 0.7 (K/uL)   Basophils Relative 0  0 - 1 (%)   Basophils Absolute 0.0  0.0 - 0.1 (K/uL)  BASIC METABOLIC PANEL     Status: Abnormal   Collection Time   08/27/11  8:10 PM      Component Value Range   Sodium 136  135 - 145 (mEq/L)   Potassium 4.7  3.5 - 5.1 (mEq/L)   Chloride 95 (*) 96 - 112  (mEq/L)   CO2 31  19 - 32 (mEq/L)   Glucose, Bld 98  70 - 99 (mg/dL)   BUN 34 (*) 6 - 23 (mg/dL)   Creatinine, Ser 8.91 (*) 0.50 - 1.35 (mg/dL)   Calcium 9.8  8.4 - 10.5 (mg/dL)   GFR calc non Af Amer 6 (*) >90 (mL/min)   GFR calc Af Amer 7 (*) >90 (mL/min)  INFLUENZA PANEL BY PCR     Status: Abnormal   Collection Time   08/27/11  9:59 PM      Component Value Range   Influenza A By PCR POSITIVE (*) NEGATIVE    Influenza B By PCR NEGATIVE  NEGATIVE    H1N1 flu by pcr NOT DETECTED  NOT DETECTED     Imaging results:  Dg Chest 2 View  08/27/2011  *RADIOLOGY REPORT*  Clinical Data: Post dialysis today.  Weakness, fever and body aches.  CHEST - 2 VIEW  Comparison: 06/14/2011 and 06/06/2011 radiographs.  Findings: Right IJ dialysis catheters are  unchanged near the SVC right atrial junction.  Heart size and mediastinal contours are stable.  There is diffuse central airway thickening with stable asymmetric scarring at the right lung base.  No definite superimposed airspace disease, edema or pleural effusion is identified.  IMPRESSION: Stable chronic lung disease.  No acute cardiopulmonary process evident.  Original Report Authenticated By: Vivia Ewing, M.D.    Assessment and Plan: I agree with the formulated Assessment and Plan with the following changes:  Influenza A is positive as I expected. Continue supportive care and tamiflu. Patient will need dialysis again tomorrow. Requests nebulizer for secretions in his upper airways.

## 2011-08-28 NOTE — Consults (Signed)
[facility] Renal Consultation Note  Indication for Consultation:  Management of ESRD/hemodialysis; anemia, hypertension/volume and secondary hyperparathyroidism  HPI: Jeffrey [name] is a 55 y.o. male with ESRD, on HD since September, HTN, hepatitis B, and HIV who had sudden onset of fever and chills yesterday during HD and went home, but was later taken to the [facility] ER by his brother and admitted with flu-like symptoms, including a fever of 103.1, headache, rhinorrhea, and occasional cough.  He also reported some nausea and decreased appetite yesterday.  Chest x-ray indicated stable chronic lung disease without edema.  He has not received antibiotics, but feels significantly better today, although he reported loose stools this morning.  He still has a dialysis catheter, but his left forearm fistula, which was placed on September 11, was used this week.     Dialysis Orders: Center: [facility]  on TTS . EDW 86.5 kg   HD Bath 2K/2.25Ca   Time 4 hrs   Heparin 4600 Units. Access AVF@LFA    BFR 400 DFR 800    Zemplar 8 mcg IV/HD   Epogen 0 Units IV/HD  Venofer  0   Past Medical History  Diagnosis Date  . Hypertension   . Gout   . Anemia   . Seizures 06/02/2011  . Thrombocytopenia   . Chronic kidney disease     ESRD requiring hemodialysis  . Anxiety   . Hepatitis B carrier   . HIV infection   . Hemodialysis patient   . Seizure    Past Surgical History  Procedure Date  . Av fistula placement 06/02/11    Left radiocephalic AVF   Family History  Problem Relation Age of Onset  . Hypertension Mother   . Cancer Father     reports that he quit smoking about 2 months ago. His smoking use included Cigarettes. He has never used smokeless tobacco. He reports that he does not drink alcohol or use illicit drugs. Allergies  Allergen Reactions  . Penicillins Other (See Comments)    Childhood allergy   Prior to Admission medications   Medication Sig Start Date End Date Taking?  Authorizing Provider  allopurinol (ZYLOPRIM) 300 MG tablet Take 300 mg by mouth daily.     Yes Historical Provider, MD  ALPRAZolam Duanne Moron) 0.5 MG tablet Take 0.5 mg by mouth 2 (two) times daily as needed. For anxiety    Yes Historical Provider, MD  Calcium Acetate 667 MG TABS Take 2 tablets by mouth 3 (three) times daily with meals.     Yes Historical Provider, MD  calcium gluconate 500 MG tablet Take 500 mg by mouth daily.     Yes Historical Provider, MD  carvedilol (COREG) 3.125 MG tablet Take 1 tablet (3.125 mg total) by mouth 2 (two) times daily with a meal. 07/22/11  Yes Bobby Rumpf, MD  diazepam (VALIUM) 2 MG tablet Take 2 mg by mouth every 8 (eight) hours as needed. For anxiety  08/19/11 08/18/12 Yes Bobby Rumpf, MD  lamivudine (EPIVIR-HBV) 5 MG/ML solution 50mg  on day one and then 25mg  daily afterwards. 07/09/11  Yes Bobby Rumpf, MD  lidocaine (XYLOCAINE) 5 % ointment Apply 1 application topically daily.     Yes Historical Provider, MD  lidocaine-prilocaine (EMLA) cream Apply 1 application topically daily.     Yes Historical Provider, MD  multivitamin (RENA-VIT) TABS tablet Take 1 tablet by mouth daily.     Yes Historical Provider, MD  Pseudoephedrine-APAP-DM (TYLENOL COLD NO DROWSINESS) 30-325-15 MG TABS Take 15 mLs by  mouth every 6 (six) hours as needed. For cold symptoms    Yes Historical Provider, MD  raltegravir (ISENTRESS) 400 MG tablet Take 400 mg by mouth 2 (two) times daily.     Yes Historical Provider, MD  tenofovir (VIREAD) 300 MG tablet Take 1 tablet (300 mg total) by mouth once a week. 07/09/11 07/08/12 Yes Bobby Rumpf, MD    I have reviewed the patient's current medications.  Labs:  Results for orders placed during the hospital encounter of 08/27/11 (from the past 48 hour(s))  CBC     Status: Abnormal   Collection Time   08/27/11  8:10 PM      Component Value Range Comment   WBC 6.5  4.0 - 10.5 (K/uL)    RBC 3.77 (*) 4.22 - 5.81 (MIL/uL)    Hemoglobin  11.2 (*) 13.0 - 17.0 (g/dL)    HCT 34.9 (*) 39.0 - 52.0 (%)    MCV 92.6  78.0 - 100.0 (fL)    MCH 29.7  26.0 - 34.0 (pg)    MCHC 32.1  30.0 - 36.0 (g/dL)    RDW 16.2 (*) 11.5 - 15.5 (%)    Platelets 126 (*) 150 - 400 (K/uL)   DIFFERENTIAL     Status: Abnormal   Collection Time   08/27/11  8:10 PM      Component Value Range Comment   Neutrophils Relative 45  43 - 77 (%)    Neutro Abs 3.0  1.7 - 7.7 (K/uL)    Lymphocytes Relative 39  12 - 46 (%)    Lymphs Abs 2.6  0.7 - 4.0 (K/uL)    Monocytes Relative 13 (*) 3 - 12 (%)    Monocytes Absolute 0.8  0.1 - 1.0 (K/uL)    Eosinophils Relative 3  0 - 5 (%)    Eosinophils Absolute 0.2  0.0 - 0.7 (K/uL)    Basophils Relative 0  0 - 1 (%)    Basophils Absolute 0.0  0.0 - 0.1 (K/uL)   BASIC METABOLIC PANEL     Status: Abnormal   Collection Time   08/27/11  8:10 PM      Component Value Range Comment   Sodium 136  135 - 145 (mEq/L)    Potassium 4.7  3.5 - 5.1 (mEq/L)    Chloride 95 (*) 96 - 112 (mEq/L)    CO2 31  19 - 32 (mEq/L)    Glucose, Bld 98  70 - 99 (mg/dL)    BUN 34 (*) 6 - 23 (mg/dL)    Creatinine, Ser 8.91 (*) 0.50 - 1.35 (mg/dL)    Calcium 9.8  8.4 - 10.5 (mg/dL)    GFR calc non Af Amer 6 (*) >90 (mL/min)    GFR calc Af Amer 7 (*) >90 (mL/min)   INFLUENZA PANEL BY PCR     Status: Abnormal   Collection Time   08/27/11  9:59 PM      Component Value Range Comment   Influenza A By PCR POSITIVE (*) NEGATIVE     Influenza B By PCR NEGATIVE  NEGATIVE     H1N1 flu by pcr NOT DETECTED  NOT DETECTED    CBC     Status: Abnormal   Collection Time   08/28/11  9:40 AM      Component Value Range Comment   WBC 6.6  4.0 - 10.5 (K/uL)    RBC 3.41 (*) 4.22 - 5.81 (MIL/uL)    Hemoglobin 10.3 (*) 13.0 - 17.0 (  g/dL)    HCT 31.9 (*) 39.0 - 52.0 (%)    MCV 93.5  78.0 - 100.0 (fL)    MCH 30.2  26.0 - 34.0 (pg)    MCHC 32.3  30.0 - 36.0 (g/dL)    RDW 16.3 (*) 11.5 - 15.5 (%)    Platelets 105 (*) 150 - 400 (K/uL) PLATELET COUNT CONFIRMED BY SMEAR    COMPREHENSIVE METABOLIC PANEL     Status: Abnormal   Collection Time   08/28/11  9:40 AM      Component Value Range Comment   Sodium 134 (*) 135 - 145 (mEq/L)    Potassium 4.6  3.5 - 5.1 (mEq/L)    Chloride 95 (*) 96 - 112 (mEq/L)    CO2 28  19 - 32 (mEq/L)    Glucose, Bld 88  70 - 99 (mg/dL)    BUN 46 (*) 6 - 23 (mg/dL)    Creatinine, Ser 10.77 (*) 0.50 - 1.35 (mg/dL)    Calcium 9.4  8.4 - 10.5 (mg/dL)    Total Protein 9.0 (*) 6.0 - 8.3 (g/dL)    Albumin 2.7 (*) 3.5 - 5.2 (g/dL)    AST 133 (*) 0 - 37 (U/L)    ALT 207 (*) 0 - 53 (U/L)    Alkaline Phosphatase 111  39 - 117 (U/L)    Total Bilirubin 0.3  0.3 - 1.2 (mg/dL)    GFR calc non Af Amer 5 (*) >90 (mL/min)    GFR calc Af Amer 5 (*) >90 (mL/min)     Constitutional: negative Eyes: negative Ears, nose, mouth, throat, and face: negative Respiratory: Occasional nonproductive cough Cardiovascular: negative Gastrointestinal: Loose stool Genitourinary:Oliguric Musculoskeletal:negative Neurological: negative  Physical Exam: Filed Vitals:   08/28/11 1000  BP: 100/61  Pulse: 79  Temp: 99.3 F (37.4 C)  Resp: 18     General appearance: alert, cooperative and no distress Head: Normocephalic, without obvious abnormality, atraumatic Throat: lips, mucosa, and tongue normal; teeth and gums normal Neck: no adenopathy, no carotid bruit, no JVD and supple, symmetrical, trachea midline Resp: clear to auscultation bilaterally Cardio: regular rate and rhythm, S1, S2 normal, no murmur, click, rub or gallop GI: soft, non-tender; bowel sounds normal; no masses,  no organomegaly Extremities: extremities normal, atraumatic, no cyanosis or edema Neurologic: Grossly normal Dialysis Access: AVF@LFA    Assessment/Plan: 1. Fever & chills - Resolving, believed to be secondary to flu, on Tamiflu, blood cultures pending; still with dialysis catheter, but now using AVF. 2. ESRD -  On HD on TTS, K 4.6, no signs of fluid overload.  HD tomorrow.   3 Hypertension/volume  - BP controlled. 4. Anemia  - Hgb 10.3, off Epogen.  5. Metabolic bone disease -  On Zemplar 8 mcg per HD, Phoslo with meals. 6. Nutrition - Alb 2.7, 3.5 on 11/22.  Ellianna Ruest 08/28/2011, 12:27 PM   Attending Nephrologist: Erling Cruz, MD

## 2011-08-28 NOTE — Consults (Signed)
Febrile illness without impressive respiratory symptoms.  Pos H flu A swab.  Has perm cath and chilled on dialysis yesterday.  Used AVF twice. Will plan to get perm cath out.  Will need to consider opportunistic infection given his HIV status.

## 2011-08-28 NOTE — H&P (Signed)
Hospital Admission Note Date: 08/28/2011  Patient name: Jeffrey [name] Medical record number: [id number] Date of birth: 29-Dec-1955 Age: 55 y.o. Gender: male PCP: Bobby Rumpf, MD, MD  Medical Service: Internal Medicine Teaching Service  Attending physician:  Desiree Hane    1st Contact: Hans Eden  [phone] 2nd Contact: Chilton Greathouse  [id number] After 5 pm or weekends: 1st Contact:      Pager: [phone] 2nd Contact:      Pager: [phone]  Chief Complaint: Fever  History of Present Illness: The patient is a 55 yo man, history of HIV (CD4 = 400, VL = 53400), HBV, ESRD on HD, presenting with fever.  The patient was in dialysis earlier today, when he developed chills, prompting him to seek medical attention.  On arrival to the ED, he was found to have a temperature of 103.1.  The patient also notes symptoms of a dry cough and frontal headache starting yesterday, described as bilateral, frontal mild dull ache, which comes and goes, and is improving, as well as symptoms of rhinorrhea starting today.  The patient notes no neck stiffness, dyspnea, SOB, cp, nausea, vomiting, diarrhea, constipation, night sweats, or weight loss.  The patient notes no sick contacts.  Meds: Medications Prior to Admission  Medication Sig Dispense Refill  . allopurinol (ZYLOPRIM) 300 MG tablet Take 300 mg by mouth daily.        . Calcium Acetate 667 MG TABS Take 2 tablets by mouth 3 (three) times daily with meals.        . calcium gluconate 500 MG tablet Take 500 mg by mouth daily.        . carvedilol (COREG) 3.125 MG tablet Take 1 tablet (3.125 mg total) by mouth 2 (two) times daily with a meal.  180 tablet  3  . diazepam (VALIUM) 2 MG tablet Take 2 mg by mouth every 8 (eight) hours as needed. For anxiety       . lamivudine (EPIVIR-HBV) 5 MG/ML solution 50mg  on day one and then 25mg  daily afterwards.  240 mL  5  . multivitamin (RENA-VIT) TABS tablet Take 1 tablet by mouth daily.        Marland Kitchen tenofovir  (VIREAD) 300 MG tablet Take 1 tablet (300 mg total) by mouth once a week.  5 tablet  11  Raltegravir (Isentress) 400 mg PO BID  Allergies: Penicillins - childhood allergy  Past Medical History  Diagnosis Date  . Hypertension - recently discontinued norvasc for hypotension with HD   . Gout - on allopurinol   . Anemia   . Seizures 06/02/2011  . Thrombocytopenia   . Chronic kidney disease     ESRD requiring hemodialysis  . Anxiety   . Hepatitis B carrier   . HIV infection   . Hemodialysis patient    Past Surgical History  Procedure Date  . Av fistula placement 06/02/11    Left radiocephalic AVF   Family History  Problem Relation Age of Onset  . Hypertension Mother   . Cancer Father    History   Social History  . Marital Status: Single    Spouse Name: N/A    Number of Children: N/A  . Years of Education: N/A   Social History Main Topics  . Smoking status: Former Smoker    Types: Cigarettes    Quit date: 06/03/2011  . Smokeless tobacco: Never Used  . Alcohol Use: No  . Drug Use: No  . Sexually Active: Not on file     pt.  declined condoms    Review of Systems: General: no changes in weight, changes in appetite Skin: no rash HEENT: no blurry vision, hearing changes, sore throat Pulm: no dyspnea, wheezing CV: no chest pain, palpitations, shortness of breath Abd: no abdominal pain, nausea/vomiting, diarrhea/constipation GU: +oliguria, no dysuria, hematuria Ext: no arthralgias, myalgias Neuro: no weakness, numbness, or tingling   Physical Exam: Blood pressure 103/70, pulse 107, temperature 100.1 F (37.8 C), temperature source Oral, resp. rate 16, SpO2 98.00%. General: alert, cooperative, and in no apparent distress HEENT: pupils equal round and reactive to light, vision grossly intact, oropharynx clear and non-erythematous  Neck: supple, no lymphadenopathy, no JVD Lungs: clear to ascultation bilaterally, normal work of respiration, no wheezes, rales,  ronchi Heart: regular rate and rhythm, no murmurs, gallops, or rubs Abdomen: soft, non-tender, non-distended, normal bowel sounds Extremities: no cyanosis, clubbing, or edema Neurologic: alert & oriented X3, cranial nerves II-XII intact, strength grossly intact, sensation intact to light touch   Lab results: Basic Metabolic Panel:  Basename 08/27/11 2010  NA 136  K 4.7  CL 95*  CO2 31  GLUCOSE 98  BUN 34*  CREATININE 8.91*  CALCIUM 9.8  MG --  PHOS --   CBC:  Basename 08/27/11 2010  WBC 6.5  NEUTROABS 3.0  HGB 11.2*  HCT 34.9*  MCV 92.6  PLT 126*   Imaging results:  Dg Chest 2 View  08/27/2011  *RADIOLOGY REPORT*  Clinical Data: Post dialysis today.  Weakness, fever and body aches.  CHEST - 2 VIEW  Comparison: 06/14/2011 and 06/06/2011 radiographs.  Findings: Right IJ dialysis catheters are unchanged near the SVC right atrial junction.  Heart size and mediastinal contours are stable.  There is diffuse central airway thickening with stable asymmetric scarring at the right lung base.  No definite superimposed airspace disease, edema or pleural effusion is identified.  IMPRESSION: Stable chronic lung disease.  No acute cardiopulmonary process evident.  Original Report Authenticated By: Vivia Ewing, M.D.   Assessment & Plan by Problem: The patient is a 56 yo man, history of HIV (CD4 = 400, VL = 53400), HBV, ESRD on HD, presenting with fever.  1. Fever - pt presents with 1-day history of fever and chills, associated with rhinorrhea, non-productive cough, concerning for influenza.  Pneumonia unlikely given absence of productive cough, physical exam, and negative CXR.  Urinary tract infection is unlikely, patient is oliguric/anuric.  Meningitis is unlikely given lack of nuchal rigidity or other meningeal signs.  Opportunistic infections are less likely given CD4 = 400, though CD4 T helper cells are 14 (low). -nasal swab for flu, empirically starting tamiflu -tylenol for  symptomatic relief -obtaining blood cultures, sputum cultures  2. HIV - CD4 = 400 (08/26/11), VL = 53400 (06/04/11), controlled with ART. -continue ART  3. ESRD on HD - on Tues/Thurs/Sat dialysis, completed dialysis today -consult nephrology in a.m., plan for dialysis Saturday if still inpatient  4. Anemia of Chronic Disease - likely due to ESRD, last ferritin = 333 (06/02/11), Hb at baseline -continue to monitor  5. HTN - SBP in 100's-110's -holding coreg acutely -amlodipine d/c'ed yesterday by Dr. Johnnye Sima  6. Anxiety - chronic, stable -continue xanax  7. Prophy - Heparin  Signed: Elnora Morrison 08/28/2011, 12:33 AM

## 2011-08-28 NOTE — ED Notes (Signed)
Pt in nad. Asking for food. No s/o resp diff. bp up from before after flds.

## 2011-08-29 ENCOUNTER — Inpatient Hospital Stay (HOSPITAL_COMMUNITY): Payer: Medicare Other

## 2011-08-29 DIAGNOSIS — J101 Influenza due to other identified influenza virus with other respiratory manifestations: Secondary | ICD-10-CM | POA: Diagnosis present

## 2011-08-29 LAB — RENAL FUNCTION PANEL
Albumin: 2.7 g/dL — ABNORMAL LOW (ref 3.5–5.2)
BUN: 64 mg/dL — ABNORMAL HIGH (ref 6–23)
Chloride: 90 mEq/L — ABNORMAL LOW (ref 96–112)
Creatinine, Ser: 13.67 mg/dL — ABNORMAL HIGH (ref 0.50–1.35)
GFR calc non Af Amer: 3 mL/min — ABNORMAL LOW (ref >90)
Phosphorus: 5.4 mg/dL — ABNORMAL HIGH (ref 2.3–4.6)
Potassium: 3.9 mEq/L (ref 3.5–5.1)

## 2011-08-29 MED ORDER — DSS 100 MG PO CAPS: 100.0000 mg | ORAL_CAPSULE | Freq: Two times a day (BID) | ORAL | Status: AC

## 2011-08-29 MED ORDER — ALPRAZOLAM 0.25 MG PO TABS: 0.2500 mg | ORAL_TABLET | Freq: Every evening | ORAL | Status: AC | PRN

## 2011-08-29 MED ORDER — LIDOCAINE-PRILOCAINE 2.5-2.5 % EX CREA: TOPICAL_CREAM | CUTANEOUS | Status: DC

## 2011-08-29 MED ORDER — OSELTAMIVIR PHOSPHATE 75 MG PO CAPS: 75.0000 mg | ORAL_CAPSULE | Freq: Two times a day (BID) | ORAL | Status: AC

## 2011-08-29 MED ORDER — ONDANSETRON HCL 4 MG PO TABS: 4.0000 mg | ORAL_TABLET | Freq: Four times a day (QID) | ORAL | Status: AC | PRN

## 2011-08-29 MED ORDER — LIDOCAINE-PRILOCAINE 2.5-2.5 % EX CREA
TOPICAL_CREAM | CUTANEOUS | Status: DC
  Administered 2011-08-29: 11:00:00 via TOPICAL
  Filled 2011-08-29: qty 5

## 2011-08-29 MED ORDER — ALBUTEROL SULFATE HFA 108 (90 BASE) MCG/ACT IN AERS: 2.0000 | INHALATION_SPRAY | RESPIRATORY_TRACT | Status: DC | PRN

## 2011-08-29 MED ORDER — ALUM & MAG HYDROXIDE-SIMETH 200-200-20 MG/5ML PO SUSP: 30.0000 mL | Freq: Four times a day (QID) | ORAL | Status: AC | PRN

## 2011-08-29 MED ORDER — DARBEPOETIN ALFA-POLYSORBATE 25 MCG/0.42ML IJ SOLN
25.0000 ug | Freq: Once | INTRAMUSCULAR | Status: AC
  Administered 2011-08-29: 25 ug via INTRAVENOUS

## 2011-08-29 MED ORDER — GUAIFENESIN-DM 100-10 MG/5ML PO SYRP: 5.0000 mL | ORAL_SOLUTION | Freq: Four times a day (QID) | ORAL | Status: AC | PRN

## 2011-08-29 MED ORDER — DARBEPOETIN ALFA-POLYSORBATE 25 MCG/0.42ML IJ SOLN
INTRAMUSCULAR | Status: AC
  Filled 2011-08-29: qty 0.42

## 2011-08-29 MED ORDER — IBUPROFEN 400 MG PO TABS: 400.0000 mg | ORAL_TABLET | Freq: Four times a day (QID) | ORAL | Status: AC | PRN

## 2011-08-29 MED ORDER — IBUPROFEN 400 MG PO TABS
400.0000 mg | ORAL_TABLET | Freq: Four times a day (QID) | ORAL | Status: DC | PRN
  Administered 2011-08-29: 400 mg via ORAL
  Filled 2011-08-29 (×2): qty 1

## 2011-08-29 NOTE — Progress Notes (Signed)
Subjective:  Feeling better this am, ready to go home  Vital signs in last 24 hours: Filed Vitals:   08/28/11 1519 08/28/11 1800 08/28/11 2018 08/29/11 0628  BP: 113/60 108/61 149/85   Pulse: 63 76 78 70  Temp: 99 F (37.2 C) 99.1 F (37.3 C) 98.1 F (36.7 C) 98 F (36.7 C)  TempSrc: Oral Oral Oral Oral  Resp: 18 18 20 20   Height:      Weight:      SpO2: 97% 99% 93% 100%   Weight change:  No intake or output data in the 24 hours ending 08/29/11 0850 Labs: Basic Metabolic Panel:  Lab 0000000 0940 08/27/11 2010  NA 134* 136  K 4.6 4.7  CL 95* 95*  CO2 28 31  GLUCOSE 88 98  BUN 46* 34*  CREATININE 10.77* 8.91*  CALCIUM 9.4 9.8  ALB -- --  PHOS -- --   Liver Function Tests:  Lab 08/28/11 0940  AST 133*  ALT 207*  ALKPHOS 111  BILITOT 0.3  PROT 9.0*  ALBUMIN 2.7*   No results found for this basename: LIPASE:3,AMYLASE:3 in the last 168 hours No results found for this basename: AMMONIA:3 in the last 168 hours CBC:  Lab 08/28/11 0940 08/27/11 2010  WBC 6.6 6.5  NEUTROABS -- 3.0  HGB 10.3* 11.2*  HCT 31.9* 34.9*  MCV 93.5 92.6  PLT 105* 126*   Cardiac Enzymes: No results found for this basename: CKTOTAL:5,CKMB:5,CKMBINDEX:5,TROPONINI:5 in the last 168 hours CBG: No results found for this basename: GLUCAP:5 in the last 168 hours  Iron Studies: No results found for this basename: IRON,TIBC,TRANSFERRIN,FERRITIN in the last 72 hours Studies/Results: Dg Chest 2 View  08/27/2011  *RADIOLOGY REPORT*  Clinical Data: Post dialysis today.  Weakness, fever and body aches.  CHEST - 2 VIEW  Comparison: 06/14/2011 and 06/06/2011 radiographs.  Findings: Right IJ dialysis catheters are unchanged near the SVC right atrial junction.  Heart size and mediastinal contours are stable.  There is diffuse central airway thickening with stable asymmetric scarring at the right lung base.  No definite superimposed airspace disease, edema or pleural effusion is identified.  IMPRESSION:  Stable chronic lung disease.  No acute cardiopulmonary process evident.  Original Report Authenticated By: Vivia Ewing, M.D.   Medications:      . allopurinol  300 mg Oral Daily  . calcium acetate  1,334 mg Oral TID WC  . calcium gluconate  500 mg Oral Daily  . docusate sodium  100 mg Oral BID  . heparin  5,000 Units Subcutaneous Q8H  . lamiVUDine  25 mg Oral Daily  . lidocaine-prilocaine   Topical Q T,Th,Sa-HD  . multivitamin  1 tablet Oral Daily  . oseltamivir  75 mg Oral To Minor  . oseltamivir  75 mg Oral Daily  . raltegravir  400 mg Oral BID  . sodium chloride  3 mL Intravenous Q12H  . tenofovir  300 mg Oral Weekly  . DISCONTD: albuterol  2.5 mg Nebulization Q6H    I  have reviewed scheduled and prn medications.  Physical Exam: General appearance: alert, cooperative and no distress  Resp: clear to auscultation bilaterally  Cardio: regular rate and rhythm, S1, S2 normal, no murmur, click, rub or gallop  GI: soft, non-tender; bowel sounds normal; no masses, no organomegaly  Extremities: extremities normal, atraumatic, no cyanosis or edema  Neurologic: Grossly normal  Dialysis Access: LFA AVF, +T/B   Problem/Plan: 1. Fever- resolving; T-max 99.1 and afebrile this am; blood  cultures pending; s/p removal perm cath; positive nasal swab influenza A; neg H1N1 and on Tamiflu.  2. ESRD - TTS [place]; HD today using AVF 3. Anxiety- problematic for him especially his "fear of needles"; EMLA cream and Ethyl Chloride spray prior to cannulation; has prn Xanax  4. Anemia - Hgb 10.3; off Epo outpt center; give 25 mcg Aranesp with HD today then follow protocol outpatient when discharged 5. Secondary hyperparathyroidism - Phoslo and Zemplar; follow ca/phos 6. HTN/volume - controlled, same outpatient meds and UF with HD 7. Nutrition - Nausea with poor appetite; continue follow adding ONSP outpatient setting 8. HIV- on meds 9. Disposition- after HD today  Marni Griffon,  FNP-C [facility] Pager [phone]  08/29/2011,8:50 AM  LOS: 2 days      For discharge after dialysis today.  Patient stable from renal standpoint, fevers have resolved.  Will resume usual outpatient HD schedule.  Patient seen and examined and agree with A/P as above.   [place] D 08/29/2011, 1:39 PM Cell #  [phone]

## 2011-08-30 NOTE — Discharge Summary (Signed)
Patient Name:  Jeffrey [name]  MRN: [id number]  PCP: Bobby Rumpf, MD, MD  DOB:  01-Jul-1956   CSN: [id number]    Date of Admission:  08/27/2011  Date of Discharge:  08/29/2011      Attending Physician: Dr. Nance Pew MD, [facility]     DISCHARGE DIAGNOSES: Principal Problem:  *Influenza A Active Problems:  Human immunodeficiency virus (HIV) disease  End stage renal disease  HTN Anxiety Anemia of chronic disease   DISPOSITION AND FOLLOW-UP:  [name] is to follow-up with the listed providers as detailed below. In this follow-up visit, please make sure his symptoms has resolved.  Follow-up Information    Follow up with Bobby Rumpf, MD on 09/10/2011. (at 4:15PM)    Contact information:   Infectious disease Dept [address] [place], [place] [address] [phone]        Discharge Orders    Future Appointments: Provider: Department: Dept Phone: Center:   09/10/2011 4:15 PM Bobby Rumpf, MD [facility] [phone] [facility]   11/25/2011 10:15 AM [facility] [phone] [facility]   12/09/2011 10:00 AM Bobby Rumpf, MD [facility] [phone] [facility]     Future Orders Please Complete By Expires   Increase activity slowly      Call MD for:  temperature >100.4      Call MD for:  severe uncontrolled pain      Call MD for:  redness, tenderness, or signs of infection (pain, swelling, redness, odor or green/yellow discharge around incision site)      Call MD for:  extreme fatigue      Call MD for:  persistant dizziness or light-headedness      Call MD for:  difficulty breathing, headache or visual disturbances          DISCHARGE MEDICATIONS: Discharge Medication List as of 08/29/2011  5:27 PM    START taking these medications   Details  albuterol (PROVENTIL HFA;VENTOLIN HFA) 108 (90 BASE) MCG/ACT inhaler Inhale 2 puffs into the lungs every 4 (four) hours as needed for wheezing or shortness of breath., Starting 08/29/2011, Until Sun  08/28/12, Normal    !! ALPRAZolam (XANAX) 0.25 MG tablet Take 1 tablet (0.25 mg total) by mouth at bedtime as needed for sleep., Starting 08/29/2011, Until Mon 09/28/11, Normal    alum & mag hydroxide-simeth (MAALOX/MYLANTA) 200-200-20 MG/5ML suspension Take 30 mLs by mouth every 6 (six) hours as needed (dyspepsia)., Starting 08/29/2011, Until Tue 09/08/11, Normal    docusate sodium 100 MG CAPS Take 100 mg by mouth 2 (two) times daily., Starting 08/29/2011, Until Tue 09/08/11, Normal    guaiFENesin-dextromethorphan (ROBITUSSIN DM) 100-10 MG/5ML syrup Take 5 mLs by mouth every 6 (six) hours as needed for cough., Starting 08/29/2011, Until Tue 09/08/11, Normal    ibuprofen (ADVIL,MOTRIN) 400 MG tablet Take 1 tablet (400 mg total) by mouth every 6 (six) hours as needed for fever or pain., Starting 08/29/2011, Until Tue 09/08/11, Normal    !! lidocaine-prilocaine (EMLA) cream Apply topically Every Tuesday,Thursday,and Saturday with dialysis., Starting 08/29/2011, Until Sun 08/28/12, Normal    ondansetron (ZOFRAN) 4 MG tablet Take 1 tablet (4 mg total) by mouth every 6 (six) hours as needed for nausea., Starting 08/29/2011, Until Sat 09/05/11, Normal    oseltamivir (TAMIFLU) 75 MG capsule Take 1 capsule (75 mg total) by mouth 2 (two) times daily., Starting 08/29/2011, Until Tue 09/02/11, Normal     !! - Potential duplicate medications found. Please discuss  with provider.    CONTINUE these medications which have NOT CHANGED   Details  allopurinol (ZYLOPRIM) 300 MG tablet Take 300 mg by mouth daily.  , Until Discontinued, Historical Med    !! ALPRAZolam (XANAX) 0.5 MG tablet Take 0.5 mg by mouth 2 (two) times daily as needed. For anxiety , Until Discontinued, Historical Med    Calcium Acetate 667 MG TABS Take 2 tablets by mouth 3 (three) times daily with meals.  , Until Discontinued, Historical Med    calcium gluconate 500 MG tablet Take 500 mg by mouth daily.  , Until Discontinued, Historical Med      carvedilol (COREG) 3.125 MG tablet Take 1 tablet (3.125 mg total) by mouth 2 (two) times daily with a meal., Starting 07/22/2011, Until Discontinued, Normal    diazepam (VALIUM) 2 MG tablet Take 2 mg by mouth every 8 (eight) hours as needed. For anxiety , Starting 08/19/2011, Until Thu 08/18/12, Historical Med    lamivudine (EPIVIR-HBV) 5 MG/ML solution 50mg  on day one and then 25mg  daily afterwards., Normal    lidocaine (XYLOCAINE) 5 % ointment Apply 1 application topically daily.  , Until Discontinued, Historical Med    !! lidocaine-prilocaine (EMLA) cream Apply 1 application topically daily.  , Until Discontinued, Historical Med    multivitamin (RENA-VIT) TABS tablet Take 1 tablet by mouth daily.  , Until Discontinued, Historical Med    Pseudoephedrine-APAP-DM (TYLENOL COLD NO DROWSINESS) 30-325-15 MG TABS Take 15 mLs by mouth every 6 (six) hours as needed. For cold symptoms , Until Discontinued, Historical Med    raltegravir (ISENTRESS) 400 MG tablet Take 400 mg by mouth 2 (two) times daily.  , Until Discontinued, Historical Med    tenofovir (VIREAD) 300 MG tablet Take 1 tablet (300 mg total) by mouth once a week., Starting 07/09/2011, Until Fri 07/08/12, Normal     !! - Potential duplicate medications found. Please discuss with provider.      CONSULTS:  Renal was consulted for HD.  Dr.  Estanislado Emms, MD saw patient.  PROCEDURES PERFORMED:   Right diatek catheter was removed  By vascular and vein specialist, ROCZNIAK,REGINA J on 08/28/2011.  Dg Chest 2 View 08/27/2011  *RADIOLOGY REPORT*  Clinical Data: Post dialysis today.  Weakness, fever and body aches.  CHEST - 2 VIEW  Comparison: 06/14/2011 and 06/06/2011 radiographs.  Findings: Right IJ dialysis catheters are unchanged near the SVC right atrial junction.  Heart size and mediastinal contours are stable.  There is diffuse central airway thickening with stable asymmetric scarring at the right lung base.  No definite superimposed  airspace disease, edema or pleural effusion is identified.  IMPRESSION: Stable chronic lung disease.  No acute cardiopulmonary process evident.  Original Report Authenticated By: Vivia Ewing, M.D.      ADMISSION DATA:  H&P:                   The patient is a 54 yo man, history of HIV (CD4 = 400, VL = 53400), HBV, ESRD on HD, presenting with fever.  The patient was in dialysis earlier today, when he developed chills, prompting him to seek medical attention.  On arrival to the ED, he was found to have a temperature of 103.1.  The patient also notes symptoms of a dry cough and frontal headache starting yesterday, described as bilateral, frontal mild dull ache, which comes and goes, and is improving, as well as symptoms of rhinorrhea starting today.  The patient notes no  neck stiffness, dyspnea, SOB, cp, nausea, vomiting, diarrhea, constipation, night sweats, or weight loss.  The patient notes no sick contacts.  Physical Exam:  General: alert, cooperative, and in no apparent distress HEENT: pupils equal round and reactive to light, vision grossly intact, oropharynx clear and non-erythematous  Neck: supple, no lymphadenopathy, no JVD Lungs: clear to ascultation bilaterally, normal work of respiration, no wheezes, rales, ronchi Heart: regular rate and rhythm, no murmurs, gallops, or rubs Abdomen: soft, non-tender, non-distended, normal bowel sounds Extremities: no cyanosis, clubbing, or edema Neurologic: alert & oriented X3, cranial nerves II-XII intact, strength grossly intact, sensation intact to light touch  Labs:  Lab results: Basic Metabolic Panel:  Basename  08/27/11 2010   NA  136   K  4.7   CL  95*   CO2  31   GLUCOSE  98   BUN  34*   CREATININE  8.91*   CALCIUM  9.8   MG  --   PHOS  --    CBC:  Basename  08/27/11 2010   WBC  6.5   NEUTROABS  3.0   HGB  11.2*   HCT  34.9*   MCV  92.6   PLT  126*     HOSPITAL COURSE:  1. Fever - Mostly likely due to Influenza  A. Pt presents with 1-day history of fever and chills, associated with rhinorrhea, non-productive cough.  Negative CXR. Meningitis is unlikely given lack of nuchal rigidity or other meningeal signs. Opportunistic infections are less likely given CD4 = 400. His Influenza A test came back positive with negative B and H1N1. Patient was treated with Tamiflu. At discharge, patient feels better. Only mild cough, not chest pain. His lung auscultation is clear.  He was discharge home Tamiflu. Will be followed up by Dr. Johnnye Sima on 09/10/11.   2. HIV - CD4 = 400, VL is less than 20 on this admission. It is well controlled with ART. Patient continued ART.  3. ESRD on HD - on Tues/Thurs/Sat dialysis. renal consulted.  4. Anemia of Chronic Disease - likely due to ESRD, last ferritin = 333 (06/02/11), Hb at baseline.   5. HTN - SBP in 100's-110's. helding coreg acutely which was restarted at discharge.   6. Anxiety - chronic, stable, continued xanax   7. Elevated transaminase: AST=133, ALT= 207. Most likely due to HVB. Patient's HBV surface antigen and Core antigen were positive on 06-02-11. Patient is asymptomatic. No treatment was demonstrated.   DISCHARGE DATA: Vital Signs: BP 113/69  Pulse 84  Temp(Src) 98.4 F (36.9 C) (Oral)  Resp 19  Ht 6' (1.829 m)  Wt 191 lb 5.8 oz (86.8 kg)  BMI 25.95 kg/m2  SpO2 91%  Labs: No results found for this or any previous visit (from the past 24 hour(s)).   Signed: Ivor [name], MD, PhD  PGY I, Internal Medicine Resident 08/30/2011, 3:34 PM

## 2011-08-31 LAB — CATH TIP CULTURE

## 2011-09-03 LAB — CULTURE, BLOOD (ROUTINE X 2)
Culture  Setup Time: 201212070901
Culture: NO GROWTH
Culture: NO GROWTH

## 2011-09-10 ENCOUNTER — Ambulatory Visit: Payer: Medicare Other | Admitting: Infectious Diseases

## 2011-09-24 DIAGNOSIS — N039 Chronic nephritic syndrome with unspecified morphologic changes: Secondary | ICD-10-CM | POA: Diagnosis not present

## 2011-09-24 DIAGNOSIS — N2581 Secondary hyperparathyroidism of renal origin: Secondary | ICD-10-CM | POA: Diagnosis not present

## 2011-09-24 DIAGNOSIS — D509 Iron deficiency anemia, unspecified: Secondary | ICD-10-CM | POA: Diagnosis not present

## 2011-09-24 DIAGNOSIS — N186 End stage renal disease: Secondary | ICD-10-CM | POA: Diagnosis not present

## 2011-09-26 DIAGNOSIS — N2581 Secondary hyperparathyroidism of renal origin: Secondary | ICD-10-CM | POA: Diagnosis not present

## 2011-09-26 DIAGNOSIS — D509 Iron deficiency anemia, unspecified: Secondary | ICD-10-CM | POA: Diagnosis not present

## 2011-09-26 DIAGNOSIS — N186 End stage renal disease: Secondary | ICD-10-CM | POA: Diagnosis not present

## 2011-09-26 DIAGNOSIS — N039 Chronic nephritic syndrome with unspecified morphologic changes: Secondary | ICD-10-CM | POA: Diagnosis not present

## 2011-09-29 DIAGNOSIS — N2581 Secondary hyperparathyroidism of renal origin: Secondary | ICD-10-CM | POA: Diagnosis not present

## 2011-09-29 DIAGNOSIS — N186 End stage renal disease: Secondary | ICD-10-CM | POA: Diagnosis not present

## 2011-09-29 DIAGNOSIS — N039 Chronic nephritic syndrome with unspecified morphologic changes: Secondary | ICD-10-CM | POA: Diagnosis not present

## 2011-09-29 DIAGNOSIS — D509 Iron deficiency anemia, unspecified: Secondary | ICD-10-CM | POA: Diagnosis not present

## 2011-10-01 DIAGNOSIS — D631 Anemia in chronic kidney disease: Secondary | ICD-10-CM | POA: Diagnosis not present

## 2011-10-01 DIAGNOSIS — N186 End stage renal disease: Secondary | ICD-10-CM | POA: Diagnosis not present

## 2011-10-01 DIAGNOSIS — N2581 Secondary hyperparathyroidism of renal origin: Secondary | ICD-10-CM | POA: Diagnosis not present

## 2011-10-01 DIAGNOSIS — D509 Iron deficiency anemia, unspecified: Secondary | ICD-10-CM | POA: Diagnosis not present

## 2011-10-02 DIAGNOSIS — N186 End stage renal disease: Secondary | ICD-10-CM | POA: Diagnosis not present

## 2011-10-02 DIAGNOSIS — D631 Anemia in chronic kidney disease: Secondary | ICD-10-CM | POA: Diagnosis not present

## 2011-10-02 DIAGNOSIS — N2581 Secondary hyperparathyroidism of renal origin: Secondary | ICD-10-CM | POA: Diagnosis not present

## 2011-10-02 DIAGNOSIS — D509 Iron deficiency anemia, unspecified: Secondary | ICD-10-CM | POA: Diagnosis not present

## 2011-10-06 DIAGNOSIS — N186 End stage renal disease: Secondary | ICD-10-CM | POA: Diagnosis not present

## 2011-10-06 DIAGNOSIS — D631 Anemia in chronic kidney disease: Secondary | ICD-10-CM | POA: Diagnosis not present

## 2011-10-06 DIAGNOSIS — N2581 Secondary hyperparathyroidism of renal origin: Secondary | ICD-10-CM | POA: Diagnosis not present

## 2011-10-06 DIAGNOSIS — D509 Iron deficiency anemia, unspecified: Secondary | ICD-10-CM | POA: Diagnosis not present

## 2011-10-07 DIAGNOSIS — N186 End stage renal disease: Secondary | ICD-10-CM | POA: Diagnosis not present

## 2011-10-07 DIAGNOSIS — N039 Chronic nephritic syndrome with unspecified morphologic changes: Secondary | ICD-10-CM | POA: Diagnosis not present

## 2011-10-07 DIAGNOSIS — N2581 Secondary hyperparathyroidism of renal origin: Secondary | ICD-10-CM | POA: Diagnosis not present

## 2011-10-07 DIAGNOSIS — D509 Iron deficiency anemia, unspecified: Secondary | ICD-10-CM | POA: Diagnosis not present

## 2011-10-08 DIAGNOSIS — D509 Iron deficiency anemia, unspecified: Secondary | ICD-10-CM | POA: Diagnosis not present

## 2011-10-08 DIAGNOSIS — N039 Chronic nephritic syndrome with unspecified morphologic changes: Secondary | ICD-10-CM | POA: Diagnosis not present

## 2011-10-08 DIAGNOSIS — N2581 Secondary hyperparathyroidism of renal origin: Secondary | ICD-10-CM | POA: Diagnosis not present

## 2011-10-08 DIAGNOSIS — N186 End stage renal disease: Secondary | ICD-10-CM | POA: Diagnosis not present

## 2011-10-10 DIAGNOSIS — D631 Anemia in chronic kidney disease: Secondary | ICD-10-CM | POA: Diagnosis not present

## 2011-10-10 DIAGNOSIS — N186 End stage renal disease: Secondary | ICD-10-CM | POA: Diagnosis not present

## 2011-10-10 DIAGNOSIS — D509 Iron deficiency anemia, unspecified: Secondary | ICD-10-CM | POA: Diagnosis not present

## 2011-10-10 DIAGNOSIS — N2581 Secondary hyperparathyroidism of renal origin: Secondary | ICD-10-CM | POA: Diagnosis not present

## 2011-10-12 DIAGNOSIS — N186 End stage renal disease: Secondary | ICD-10-CM | POA: Diagnosis not present

## 2011-10-12 DIAGNOSIS — N039 Chronic nephritic syndrome with unspecified morphologic changes: Secondary | ICD-10-CM | POA: Diagnosis not present

## 2011-10-12 DIAGNOSIS — N2581 Secondary hyperparathyroidism of renal origin: Secondary | ICD-10-CM | POA: Diagnosis not present

## 2011-10-12 DIAGNOSIS — D509 Iron deficiency anemia, unspecified: Secondary | ICD-10-CM | POA: Diagnosis not present

## 2011-10-14 ENCOUNTER — Encounter: Payer: Self-pay | Admitting: *Deleted

## 2011-10-14 ENCOUNTER — Other Ambulatory Visit: Payer: Self-pay | Admitting: *Deleted

## 2011-10-14 DIAGNOSIS — D631 Anemia in chronic kidney disease: Secondary | ICD-10-CM | POA: Diagnosis not present

## 2011-10-14 DIAGNOSIS — N186 End stage renal disease: Secondary | ICD-10-CM | POA: Diagnosis not present

## 2011-10-14 DIAGNOSIS — N2581 Secondary hyperparathyroidism of renal origin: Secondary | ICD-10-CM | POA: Diagnosis not present

## 2011-10-14 DIAGNOSIS — F419 Anxiety disorder, unspecified: Secondary | ICD-10-CM

## 2011-10-14 DIAGNOSIS — D509 Iron deficiency anemia, unspecified: Secondary | ICD-10-CM | POA: Diagnosis not present

## 2011-10-14 MED ORDER — ALPRAZOLAM 0.5 MG PO TABS: 0.5000 mg | ORAL_TABLET | Freq: Two times a day (BID) | ORAL | Status: DC | PRN

## 2011-10-14 NOTE — Telephone Encounter (Signed)
Patient called requesting a refill on Xanax, said he is having a fistula placed with dialysis and he gets very anxious when this is done.  Dr. Johnnye Sima okd this for #10 and advised that he find a PCP for his other medical concerns.  Patient notified and RX called to [facility] on [address]. Myrtis Hopping CMA

## 2011-10-16 DIAGNOSIS — N039 Chronic nephritic syndrome with unspecified morphologic changes: Secondary | ICD-10-CM | POA: Diagnosis not present

## 2011-10-16 DIAGNOSIS — N186 End stage renal disease: Secondary | ICD-10-CM | POA: Diagnosis not present

## 2011-10-16 DIAGNOSIS — N2581 Secondary hyperparathyroidism of renal origin: Secondary | ICD-10-CM | POA: Diagnosis not present

## 2011-10-16 DIAGNOSIS — D509 Iron deficiency anemia, unspecified: Secondary | ICD-10-CM | POA: Diagnosis not present

## 2011-10-19 DIAGNOSIS — N2581 Secondary hyperparathyroidism of renal origin: Secondary | ICD-10-CM | POA: Diagnosis not present

## 2011-10-19 DIAGNOSIS — N186 End stage renal disease: Secondary | ICD-10-CM | POA: Diagnosis not present

## 2011-10-19 DIAGNOSIS — N039 Chronic nephritic syndrome with unspecified morphologic changes: Secondary | ICD-10-CM | POA: Diagnosis not present

## 2011-10-19 DIAGNOSIS — D509 Iron deficiency anemia, unspecified: Secondary | ICD-10-CM | POA: Diagnosis not present

## 2011-10-22 DIAGNOSIS — N039 Chronic nephritic syndrome with unspecified morphologic changes: Secondary | ICD-10-CM | POA: Diagnosis not present

## 2011-10-22 DIAGNOSIS — D509 Iron deficiency anemia, unspecified: Secondary | ICD-10-CM | POA: Diagnosis not present

## 2011-10-22 DIAGNOSIS — N2581 Secondary hyperparathyroidism of renal origin: Secondary | ICD-10-CM | POA: Diagnosis not present

## 2011-10-22 DIAGNOSIS — D631 Anemia in chronic kidney disease: Secondary | ICD-10-CM | POA: Diagnosis not present

## 2011-10-22 DIAGNOSIS — N186 End stage renal disease: Secondary | ICD-10-CM | POA: Diagnosis not present

## 2011-10-23 DIAGNOSIS — D509 Iron deficiency anemia, unspecified: Secondary | ICD-10-CM | POA: Diagnosis not present

## 2011-10-23 DIAGNOSIS — N2581 Secondary hyperparathyroidism of renal origin: Secondary | ICD-10-CM | POA: Diagnosis not present

## 2011-10-23 DIAGNOSIS — N186 End stage renal disease: Secondary | ICD-10-CM | POA: Diagnosis not present

## 2011-10-29 ENCOUNTER — Other Ambulatory Visit: Payer: Self-pay

## 2011-10-29 ENCOUNTER — Encounter (HOSPITAL_COMMUNITY): Payer: Self-pay | Admitting: Emergency Medicine

## 2011-10-29 ENCOUNTER — Emergency Department (INDEPENDENT_AMBULATORY_CARE_PROVIDER_SITE_OTHER)
Admission: EM | Admit: 2011-10-29 | Discharge: 2011-10-29 | Disposition: A | Discharge status: Home / Self Care | Payer: Medicare Other | Source: Home / Self Care | Attending: Emergency Medicine | Admitting: Emergency Medicine

## 2011-10-29 DIAGNOSIS — F419 Anxiety disorder, unspecified: Secondary | ICD-10-CM

## 2011-10-29 DIAGNOSIS — Z760 Encounter for issue of repeat prescription: Secondary | ICD-10-CM | POA: Diagnosis not present

## 2011-10-29 DIAGNOSIS — F411 Generalized anxiety disorder: Secondary | ICD-10-CM | POA: Diagnosis not present

## 2011-10-29 LAB — POCT I-STAT, CHEM 8
Creatinine, Ser: 11.4 mg/dL — ABNORMAL HIGH (ref 0.50–1.35)
Glucose, Bld: 98 mg/dL (ref 70–99)
HCT: 47 % (ref 39.0–52.0)
Hemoglobin: 16 g/dL (ref 13.0–17.0)
TCO2: 30 mmol/L (ref 0–100)

## 2011-10-29 MED ORDER — ALPRAZOLAM 0.5 MG PO TABS: 0.5000 mg | ORAL_TABLET | Freq: Two times a day (BID) | ORAL | Status: DC | PRN

## 2011-10-29 NOTE — ED Notes (Signed)
Pt's placed on monitor per MD Mortenson.

## 2011-10-29 NOTE — ED Notes (Signed)
PT BROUGHT BACK TO ROOM 2 URGENT FOR SUDDEN ONSET OF RAPID HEARTBEAT AND ANXIETY THAT STARTED ON Tuesday WITH DIALYSIS TREATMENT.PT STATES HE USUALLY TAKES ALPRAZOLAM WITH DIALYSIS BECAUSE HE SCARED OF NEEDLES BUT RAN OUT OF MEDS X WEEK.DENIES CP OR SOB.

## 2011-10-29 NOTE — ED Provider Notes (Signed)
History     CSN: [id number]  Arrival date & time 10/29/11  1058   First MD Initiated Contact with Patient 10/29/11 1114      Chief Complaint  Patient presents with  . Tachycardia  . Anxiety    (Consider location/radiation/quality/duration/timing/severity/associated sxs/prior treatment) HPI Comments:  Patient reports palpitations while at dialysis yesterday. No nausea, vomiting, diaphoresis, chest pain or pressure, radiation of neck or down left arm, headache, abdominal pain, presyncope, syncope while having palpitations. States he gets extremely anxious before dialysis because he is "afraid of needles", And usually takes Xanax prior to dialysis for this. States he ran out of Xanax, but wanted to try having dialysis anyway. States that he did not complete dialysis secondary to tachycardia. patient currently asymptomatic. Patient brought in bottle of Xanax, 10 tabs,  which was dispenseed on 10/14/2011  ROS as noted in HPI. All other ROS negative.   Patient is a 56 y.o. male presenting with palpitations. The history is provided by the patient. No language interpreter was used.  Palpitations  This is a new problem. The current episode started yesterday. The problem is associated with anxiety. Pertinent negatives include no diaphoresis, no fever, no malaise/fatigue, no chest pain, no chest pressure, no exertional chest pressure, no irregular heartbeat, no near-syncope, no syncope, no abdominal pain, no nausea, no vomiting, no back pain, no dizziness, no weakness, no cough and no shortness of breath. He has tried nothing for the symptoms.    Past Medical History  Diagnosis Date  . Hypertension   . Gout   . Anemia   . Seizures 06/02/2011  . Thrombocytopenia   . Chronic kidney disease     ESRD requiring hemodialysis  . Anxiety   . Hepatitis B carrier   . HIV infection   . Hemodialysis patient   . Seizure     Past Surgical History  Procedure Date  . Av fistula placement 06/02/11   Left radiocephalic AVF    Family History  Problem Relation Age of Onset  . Hypertension Mother   . Cancer Father     History  Substance Use Topics  . Smoking status: Former Smoker    Types: Cigarettes    Quit date: 06/03/2011  . Smokeless tobacco: Never Used  . Alcohol Use: No      Review of Systems  Constitutional: Negative for fever, malaise/fatigue and diaphoresis.  Respiratory: Negative for cough and shortness of breath.   Cardiovascular: Positive for palpitations. Negative for chest pain, syncope and near-syncope.  Gastrointestinal: Negative for nausea, vomiting and abdominal pain.  Musculoskeletal: Negative for back pain.  Neurological: Negative for dizziness and weakness.    Allergies  Penicillins  Home Medications   Current Outpatient Rx  Name Route Sig Dispense Refill  . ALBUTEROL SULFATE HFA 108 (90 BASE) MCG/ACT IN AERS Inhalation Inhale 2 puffs into the lungs every 4 (four) hours as needed for wheezing or shortness of breath. 1 Inhaler 3  . ALLOPURINOL 300 MG PO TABS Oral Take 300 mg by mouth daily.      Marland Kitchen ALPRAZOLAM 0.5 MG PO TABS Oral Take 1 tablet (0.5 mg total) by mouth 2 (two) times daily as needed for anxiety. For anxiety 10 tablet 0  . CALCIUM ACETATE 667 MG PO TABS Oral Take 2 tablets by mouth 3 (three) times daily with meals.      Marland Kitchen CALCIUM GLUCONATE 500 MG PO TABS Oral Take 500 mg by mouth daily.      Marland Kitchen CARVEDILOL 3.125 MG  PO TABS Oral Take 1 tablet (3.125 mg total) by mouth 2 (two) times daily with a meal. 180 tablet 3  . DIAZEPAM 2 MG PO TABS Oral Take 2 mg by mouth every 8 (eight) hours as needed. For anxiety     . LAMIVUDINE 5 MG/ML PO SOLN  50mg  on day one and then 25mg  daily afterwards. 240 mL 5  . LIDOCAINE 5 % EX OINT Topical Apply 1 application topically daily.      Marland Kitchen LIDOCAINE-PRILOCAINE 2.5-2.5 % EX CREA Topical Apply 1 application topically daily.      Marland Kitchen LIDOCAINE-PRILOCAINE 2.5-2.5 % EX CREA Topical Apply topically Every  Tuesday,Thursday,and Saturday with dialysis. 30 g 1  . RENA-VITE PO TABS Oral Take 1 tablet by mouth daily.      Marland Kitchen PSEUDOEPHEDRINE-APAP-DM 30-325-15 MG PO TABS Oral Take 15 mLs by mouth every 6 (six) hours as needed. For cold symptoms     . RALTEGRAVIR POTASSIUM 400 MG PO TABS Oral Take 400 mg by mouth 2 (two) times daily.      . TENOFOVIR DISOPROXIL FUMARATE 300 MG PO TABS Oral Take 1 tablet (300 mg total) by mouth once a week. 5 tablet 11    One pill weekly after dailysis    BP 127/80  Pulse 100  Temp(Src) 98.8 F (37.1 C) (Oral)  Resp 18  SpO2 98%  Physical Exam  Nursing note and vitals reviewed. Constitutional: He is oriented to person, place, and time. He appears well-developed and well-nourished.  HENT:  Head: Normocephalic and atraumatic.  Eyes: Conjunctivae and EOM are normal.  Neck: Normal range of motion.  Cardiovascular: Normal rate, regular rhythm, normal heart sounds and intact distal pulses.  Exam reveals no gallop and no friction rub.   No murmur heard. Pulmonary/Chest: Effort normal and breath sounds normal. No respiratory distress. He has no wheezes. He exhibits no tenderness.  Abdominal: Soft. Bowel sounds are normal. He exhibits no distension. There is no tenderness. There is no rebound.  Musculoskeletal: Normal range of motion. He exhibits no edema.       Thrill an AV fistula left forearm. fistula clean dry intact. No signs of infection.  Neurological: He is alert and oriented to person, place, and time.  Skin: Skin is warm and dry. No rash noted.  Psychiatric: He has a normal mood and affect. His behavior is normal. Judgment and thought content normal.    ED Course  Procedures (including critical care time)  Labs Reviewed  POCT I-STAT, CHEM 8 - Abnormal; Notable for the following:    BUN 73 (*)    Creatinine, Ser 11.40 (*)    All other components within normal limits   No results found.   1. Anxiety     EKG: Normal sinus rhythm, rate 95. Normal  axis, normal intervals. Right atrial enlargement. No ST T-wave changes compared to previous EKG from 08/2011  MDM  Previous charts, labs, imaging reviewed.  Was admitted to hospital on 08/28/11 for influenza. Also UDS positive for cocaine, patient states that he was taking Sudafed at the time.. CD4 400, viral and 53400 in December  [place] narcotic database reviewed. Patient with prescriptions of benzodiazepines from the same 2 providers over the past year. Last prescription written 10/14/2011 by Dr. Lita Mains.  Discussed case with Dr. Johnnye Sima. Patient with a long history of anxiety with dialysis. Current symptoms consistent with previous episodes.  Will check potassium, as patient did not complete dialysis yesterday. Patient asymptomatic here, Vitals acceptable. has  been in normal sinus rhythm on monitor while in department. Will also check EKG. If labs, EKG within normal limits, will discharge patient home with short prescription of Xanax, and patient to followup with Dr. Johnnye Sima in the next several days.  Discussed labs, EKG, and medical decision-making with patient. discussed when to return the ED. patient voices understanding, and agrees with plan  Cherly Beach, MD 10/29/11 2113

## 2011-11-04 ENCOUNTER — Telehealth: Payer: Self-pay

## 2011-11-04 NOTE — Telephone Encounter (Signed)
Patient called about [employer] - has Medicare and Medicaid - called Glenys at [facility] and faxed copy of cards - told him Medicare/medicaid will cover his meds - he does not need to reapply for [employer]. Also, called Nash Dimmer at [employer] as well.

## 2011-11-19 DIAGNOSIS — N186 End stage renal disease: Secondary | ICD-10-CM | POA: Diagnosis not present

## 2011-11-20 DIAGNOSIS — N2581 Secondary hyperparathyroidism of renal origin: Secondary | ICD-10-CM | POA: Diagnosis not present

## 2011-11-20 DIAGNOSIS — N186 End stage renal disease: Secondary | ICD-10-CM | POA: Diagnosis not present

## 2011-11-23 DIAGNOSIS — N2581 Secondary hyperparathyroidism of renal origin: Secondary | ICD-10-CM | POA: Diagnosis not present

## 2011-11-23 DIAGNOSIS — N186 End stage renal disease: Secondary | ICD-10-CM | POA: Diagnosis not present

## 2011-11-24 ENCOUNTER — Other Ambulatory Visit: Payer: Medicare Other

## 2011-11-25 ENCOUNTER — Other Ambulatory Visit: Payer: Self-pay | Admitting: Infectious Diseases

## 2011-11-25 ENCOUNTER — Other Ambulatory Visit (INDEPENDENT_AMBULATORY_CARE_PROVIDER_SITE_OTHER): Payer: Medicare Other

## 2011-11-25 ENCOUNTER — Other Ambulatory Visit: Payer: Medicare Other

## 2011-11-25 DIAGNOSIS — Z79899 Other long term (current) drug therapy: Secondary | ICD-10-CM | POA: Diagnosis not present

## 2011-11-25 DIAGNOSIS — B20 Human immunodeficiency virus [HIV] disease: Secondary | ICD-10-CM

## 2011-11-25 DIAGNOSIS — N186 End stage renal disease: Secondary | ICD-10-CM | POA: Diagnosis not present

## 2011-11-25 DIAGNOSIS — Z113 Encounter for screening for infections with a predominantly sexual mode of transmission: Secondary | ICD-10-CM | POA: Diagnosis not present

## 2011-11-25 DIAGNOSIS — N2581 Secondary hyperparathyroidism of renal origin: Secondary | ICD-10-CM | POA: Diagnosis not present

## 2011-11-26 LAB — COMPREHENSIVE METABOLIC PANEL
Albumin: 4.1 g/dL (ref 3.5–5.2)
CO2: 30 mEq/L (ref 19–32)
Calcium: 9.5 mg/dL (ref 8.4–10.5)
Chloride: 96 mEq/L (ref 96–112)
Glucose, Bld: 94 mg/dL (ref 70–99)
Potassium: 4.5 mEq/L (ref 3.5–5.3)
Sodium: 136 mEq/L (ref 135–145)
Total Protein: 9.5 g/dL — ABNORMAL HIGH (ref 6.0–8.3)

## 2011-11-26 LAB — CBC WITH DIFFERENTIAL/PLATELET
Eosinophils Absolute: 0.2 10*3/uL (ref 0.0–0.7)
Lymphs Abs: 1.8 10*3/uL (ref 0.7–4.0)
MCH: 32.8 pg (ref 26.0–34.0)
Neutrophils Relative %: 54 % (ref 43–77)
Platelets: 178 10*3/uL (ref 150–400)
RBC: 3.96 MIL/uL — ABNORMAL LOW (ref 4.22–5.81)
WBC: 5.3 10*3/uL (ref 4.0–10.5)

## 2011-11-26 LAB — T-HELPER CELL (CD4) - (RCID CLINIC ONLY): CD4 T Cell Abs: 320 uL — ABNORMAL LOW (ref 400–2700)

## 2011-11-26 LAB — RPR

## 2011-11-27 DIAGNOSIS — N186 End stage renal disease: Secondary | ICD-10-CM | POA: Diagnosis not present

## 2011-11-27 DIAGNOSIS — N2581 Secondary hyperparathyroidism of renal origin: Secondary | ICD-10-CM | POA: Diagnosis not present

## 2011-11-27 LAB — HIV-1 RNA QUANT-NO REFLEX-BLD: HIV-1 RNA Quant, Log: <1.3 {Log} (ref <1.30)

## 2011-11-30 DIAGNOSIS — D509 Iron deficiency anemia, unspecified: Secondary | ICD-10-CM | POA: Diagnosis not present

## 2011-11-30 DIAGNOSIS — N186 End stage renal disease: Secondary | ICD-10-CM | POA: Diagnosis not present

## 2011-11-30 DIAGNOSIS — N2581 Secondary hyperparathyroidism of renal origin: Secondary | ICD-10-CM | POA: Diagnosis not present

## 2011-11-30 DIAGNOSIS — Z992 Dependence on renal dialysis: Secondary | ICD-10-CM | POA: Diagnosis not present

## 2011-12-01 DIAGNOSIS — Z992 Dependence on renal dialysis: Secondary | ICD-10-CM | POA: Diagnosis not present

## 2011-12-01 DIAGNOSIS — N2581 Secondary hyperparathyroidism of renal origin: Secondary | ICD-10-CM | POA: Diagnosis not present

## 2011-12-01 DIAGNOSIS — D509 Iron deficiency anemia, unspecified: Secondary | ICD-10-CM | POA: Diagnosis not present

## 2011-12-01 DIAGNOSIS — N186 End stage renal disease: Secondary | ICD-10-CM | POA: Diagnosis not present

## 2011-12-02 DIAGNOSIS — D509 Iron deficiency anemia, unspecified: Secondary | ICD-10-CM | POA: Diagnosis not present

## 2011-12-02 DIAGNOSIS — Z992 Dependence on renal dialysis: Secondary | ICD-10-CM | POA: Diagnosis not present

## 2011-12-02 DIAGNOSIS — N2581 Secondary hyperparathyroidism of renal origin: Secondary | ICD-10-CM | POA: Diagnosis not present

## 2011-12-02 DIAGNOSIS — N186 End stage renal disease: Secondary | ICD-10-CM | POA: Diagnosis not present

## 2011-12-03 DIAGNOSIS — D509 Iron deficiency anemia, unspecified: Secondary | ICD-10-CM | POA: Diagnosis not present

## 2011-12-03 DIAGNOSIS — Z992 Dependence on renal dialysis: Secondary | ICD-10-CM | POA: Diagnosis not present

## 2011-12-03 DIAGNOSIS — N186 End stage renal disease: Secondary | ICD-10-CM | POA: Diagnosis not present

## 2011-12-03 DIAGNOSIS — N2581 Secondary hyperparathyroidism of renal origin: Secondary | ICD-10-CM | POA: Diagnosis not present

## 2011-12-04 DIAGNOSIS — Z992 Dependence on renal dialysis: Secondary | ICD-10-CM | POA: Diagnosis not present

## 2011-12-04 DIAGNOSIS — N186 End stage renal disease: Secondary | ICD-10-CM | POA: Diagnosis not present

## 2011-12-04 DIAGNOSIS — D509 Iron deficiency anemia, unspecified: Secondary | ICD-10-CM | POA: Diagnosis not present

## 2011-12-04 DIAGNOSIS — N2581 Secondary hyperparathyroidism of renal origin: Secondary | ICD-10-CM | POA: Diagnosis not present

## 2011-12-07 DIAGNOSIS — N2581 Secondary hyperparathyroidism of renal origin: Secondary | ICD-10-CM | POA: Diagnosis not present

## 2011-12-07 DIAGNOSIS — N186 End stage renal disease: Secondary | ICD-10-CM | POA: Diagnosis not present

## 2011-12-07 DIAGNOSIS — D509 Iron deficiency anemia, unspecified: Secondary | ICD-10-CM | POA: Diagnosis not present

## 2011-12-07 DIAGNOSIS — Z992 Dependence on renal dialysis: Secondary | ICD-10-CM | POA: Diagnosis not present

## 2011-12-07 DIAGNOSIS — E039 Hypothyroidism, unspecified: Secondary | ICD-10-CM | POA: Diagnosis not present

## 2011-12-09 ENCOUNTER — Ambulatory Visit: Payer: Medicare Other | Admitting: Infectious Diseases

## 2011-12-09 DIAGNOSIS — N186 End stage renal disease: Secondary | ICD-10-CM | POA: Diagnosis not present

## 2011-12-09 DIAGNOSIS — D509 Iron deficiency anemia, unspecified: Secondary | ICD-10-CM | POA: Diagnosis not present

## 2011-12-09 DIAGNOSIS — N2581 Secondary hyperparathyroidism of renal origin: Secondary | ICD-10-CM | POA: Diagnosis not present

## 2011-12-09 DIAGNOSIS — Z992 Dependence on renal dialysis: Secondary | ICD-10-CM | POA: Diagnosis not present

## 2011-12-10 ENCOUNTER — Encounter: Payer: Self-pay | Admitting: Infectious Diseases

## 2011-12-10 ENCOUNTER — Other Ambulatory Visit: Payer: Self-pay | Admitting: *Deleted

## 2011-12-10 ENCOUNTER — Ambulatory Visit (INDEPENDENT_AMBULATORY_CARE_PROVIDER_SITE_OTHER): Payer: Medicare Other | Admitting: Infectious Diseases

## 2011-12-10 VITALS — BP 118/77 | HR 98 | Temp 98.2°F | Ht 72.0 in | Wt 212.1 lb

## 2011-12-10 DIAGNOSIS — B20 Human immunodeficiency virus [HIV] disease: Secondary | ICD-10-CM

## 2011-12-10 DIAGNOSIS — F419 Anxiety disorder, unspecified: Secondary | ICD-10-CM

## 2011-12-10 DIAGNOSIS — N189 Chronic kidney disease, unspecified: Secondary | ICD-10-CM

## 2011-12-10 DIAGNOSIS — F411 Generalized anxiety disorder: Secondary | ICD-10-CM | POA: Diagnosis not present

## 2011-12-10 DIAGNOSIS — N2581 Secondary hyperparathyroidism of renal origin: Secondary | ICD-10-CM | POA: Diagnosis not present

## 2011-12-10 DIAGNOSIS — N186 End stage renal disease: Secondary | ICD-10-CM | POA: Diagnosis not present

## 2011-12-10 DIAGNOSIS — D509 Iron deficiency anemia, unspecified: Secondary | ICD-10-CM | POA: Diagnosis not present

## 2011-12-10 DIAGNOSIS — Z992 Dependence on renal dialysis: Secondary | ICD-10-CM | POA: Diagnosis not present

## 2011-12-10 MED ORDER — CALCIUM GLUCONATE 500 MG PO TABS: 500.0000 mg | ORAL_TABLET | Freq: Every day | ORAL | Status: DC

## 2011-12-10 MED ORDER — ALPRAZOLAM 0.5 MG PO TABS: 0.5000 mg | ORAL_TABLET | Freq: Two times a day (BID) | ORAL | Status: DC | PRN

## 2011-12-10 NOTE — Assessment & Plan Note (Signed)
He is doing well. Spoke with him about his Cd4 (slightly down). Offered condoms/refused. His vaccines are up to date. Will see him back in 6 months with labs prior.

## 2011-12-10 NOTE — Progress Notes (Signed)
  Subjective:    Patient ID: Jeffrey [name], male    DOB: 29-Jul-1956, 56 y.o.   MRN: [id number]  HPI 56 yo M with HIV+, ESRD, and admission December 2012 with Influenza and his HD catheter was infected. Marland Kitchen He was treated with oseltamivir. Currently on TFV/3TC/ISN. Getting trained for home HD. No problems with ART. Was seen in urgent care in February for anxiety and has been on xanax since. Would like refill.  No problemswith AV fistula, although he thinks that sometimes the needle clots.   HIV 1 RNA Quant (copies/mL)  Date Value  11/25/2011 <20   08/26/2011 <20   October 17, 202012 53400*     CD4 T Cell Abs (cmm)  Date Value  11/25/2011 320*  08/26/2011 400   October 17, 202012 330*       Review of Systems  Constitutional: Negative for appetite change and unexpected weight change.  Gastrointestinal: Negative for diarrhea and constipation.  Genitourinary: Negative for dysuria and hematuria.       Objective:   Physical Exam        Assessment & Plan:

## 2011-12-10 NOTE — Assessment & Plan Note (Signed)
He asks for a re-fill on his xanax. He is not taking valium.

## 2011-12-10 NOTE — Assessment & Plan Note (Addendum)
His graft is well healed. He is doing well. Appreciate partnering with Dr Marval Regal.

## 2011-12-11 DIAGNOSIS — N186 End stage renal disease: Secondary | ICD-10-CM | POA: Diagnosis not present

## 2011-12-11 DIAGNOSIS — Z992 Dependence on renal dialysis: Secondary | ICD-10-CM | POA: Diagnosis not present

## 2011-12-11 DIAGNOSIS — D509 Iron deficiency anemia, unspecified: Secondary | ICD-10-CM | POA: Diagnosis not present

## 2011-12-11 DIAGNOSIS — N2581 Secondary hyperparathyroidism of renal origin: Secondary | ICD-10-CM | POA: Diagnosis not present

## 2011-12-14 DIAGNOSIS — N186 End stage renal disease: Secondary | ICD-10-CM | POA: Diagnosis not present

## 2011-12-14 DIAGNOSIS — D509 Iron deficiency anemia, unspecified: Secondary | ICD-10-CM | POA: Diagnosis not present

## 2011-12-14 DIAGNOSIS — Z992 Dependence on renal dialysis: Secondary | ICD-10-CM | POA: Diagnosis not present

## 2011-12-14 DIAGNOSIS — N2581 Secondary hyperparathyroidism of renal origin: Secondary | ICD-10-CM | POA: Diagnosis not present

## 2011-12-16 DIAGNOSIS — Z992 Dependence on renal dialysis: Secondary | ICD-10-CM | POA: Diagnosis not present

## 2011-12-16 DIAGNOSIS — N2581 Secondary hyperparathyroidism of renal origin: Secondary | ICD-10-CM | POA: Diagnosis not present

## 2011-12-16 DIAGNOSIS — D509 Iron deficiency anemia, unspecified: Secondary | ICD-10-CM | POA: Diagnosis not present

## 2011-12-16 DIAGNOSIS — N186 End stage renal disease: Secondary | ICD-10-CM | POA: Diagnosis not present

## 2011-12-18 DIAGNOSIS — Z992 Dependence on renal dialysis: Secondary | ICD-10-CM | POA: Diagnosis not present

## 2011-12-18 DIAGNOSIS — D509 Iron deficiency anemia, unspecified: Secondary | ICD-10-CM | POA: Diagnosis not present

## 2011-12-18 DIAGNOSIS — N2581 Secondary hyperparathyroidism of renal origin: Secondary | ICD-10-CM | POA: Diagnosis not present

## 2011-12-18 DIAGNOSIS — N186 End stage renal disease: Secondary | ICD-10-CM | POA: Diagnosis not present

## 2011-12-20 DIAGNOSIS — N186 End stage renal disease: Secondary | ICD-10-CM | POA: Diagnosis not present

## 2011-12-21 DIAGNOSIS — D631 Anemia in chronic kidney disease: Secondary | ICD-10-CM | POA: Diagnosis not present

## 2011-12-21 DIAGNOSIS — N039 Chronic nephritic syndrome with unspecified morphologic changes: Secondary | ICD-10-CM | POA: Diagnosis not present

## 2011-12-21 DIAGNOSIS — N2581 Secondary hyperparathyroidism of renal origin: Secondary | ICD-10-CM | POA: Diagnosis not present

## 2011-12-21 DIAGNOSIS — Z992 Dependence on renal dialysis: Secondary | ICD-10-CM | POA: Diagnosis not present

## 2011-12-21 DIAGNOSIS — D509 Iron deficiency anemia, unspecified: Secondary | ICD-10-CM | POA: Diagnosis not present

## 2011-12-21 DIAGNOSIS — N186 End stage renal disease: Secondary | ICD-10-CM | POA: Diagnosis not present

## 2011-12-22 DIAGNOSIS — N2581 Secondary hyperparathyroidism of renal origin: Secondary | ICD-10-CM | POA: Diagnosis not present

## 2011-12-22 DIAGNOSIS — N039 Chronic nephritic syndrome with unspecified morphologic changes: Secondary | ICD-10-CM | POA: Diagnosis not present

## 2011-12-22 DIAGNOSIS — N186 End stage renal disease: Secondary | ICD-10-CM | POA: Diagnosis not present

## 2011-12-22 DIAGNOSIS — D631 Anemia in chronic kidney disease: Secondary | ICD-10-CM | POA: Diagnosis not present

## 2011-12-22 DIAGNOSIS — D509 Iron deficiency anemia, unspecified: Secondary | ICD-10-CM | POA: Diagnosis not present

## 2011-12-22 DIAGNOSIS — Z992 Dependence on renal dialysis: Secondary | ICD-10-CM | POA: Diagnosis not present

## 2011-12-23 DIAGNOSIS — N2581 Secondary hyperparathyroidism of renal origin: Secondary | ICD-10-CM | POA: Diagnosis not present

## 2011-12-23 DIAGNOSIS — N039 Chronic nephritic syndrome with unspecified morphologic changes: Secondary | ICD-10-CM | POA: Diagnosis not present

## 2011-12-23 DIAGNOSIS — D509 Iron deficiency anemia, unspecified: Secondary | ICD-10-CM | POA: Diagnosis not present

## 2011-12-23 DIAGNOSIS — N186 End stage renal disease: Secondary | ICD-10-CM | POA: Diagnosis not present

## 2011-12-23 DIAGNOSIS — Z992 Dependence on renal dialysis: Secondary | ICD-10-CM | POA: Diagnosis not present

## 2011-12-24 ENCOUNTER — Other Ambulatory Visit (HOSPITAL_COMMUNITY): Payer: Self-pay | Admitting: Nephrology

## 2011-12-24 DIAGNOSIS — N186 End stage renal disease: Secondary | ICD-10-CM | POA: Diagnosis not present

## 2011-12-24 DIAGNOSIS — N039 Chronic nephritic syndrome with unspecified morphologic changes: Secondary | ICD-10-CM | POA: Diagnosis not present

## 2011-12-24 DIAGNOSIS — N2581 Secondary hyperparathyroidism of renal origin: Secondary | ICD-10-CM | POA: Diagnosis not present

## 2011-12-24 DIAGNOSIS — Z992 Dependence on renal dialysis: Secondary | ICD-10-CM | POA: Diagnosis not present

## 2011-12-24 DIAGNOSIS — D509 Iron deficiency anemia, unspecified: Secondary | ICD-10-CM | POA: Diagnosis not present

## 2011-12-25 ENCOUNTER — Ambulatory Visit (HOSPITAL_COMMUNITY)
Admission: RE | Admit: 2011-12-25 | Discharge: 2011-12-25 | Disposition: A | Discharge status: Home / Self Care | Payer: Medicare Other | Source: Ambulatory Visit | Attending: Nephrology | Admitting: Nephrology

## 2011-12-25 DIAGNOSIS — T82898A Other specified complication of vascular prosthetic devices, implants and grafts, initial encounter: Secondary | ICD-10-CM | POA: Insufficient documentation

## 2011-12-25 DIAGNOSIS — Y832 Surgical operation with anastomosis, bypass or graft as the cause of abnormal reaction of the patient, or of later complication, without mention of misadventure at the time of the procedure: Secondary | ICD-10-CM | POA: Insufficient documentation

## 2011-12-25 DIAGNOSIS — Z992 Dependence on renal dialysis: Secondary | ICD-10-CM | POA: Diagnosis not present

## 2011-12-25 DIAGNOSIS — D631 Anemia in chronic kidney disease: Secondary | ICD-10-CM | POA: Diagnosis not present

## 2011-12-25 DIAGNOSIS — N186 End stage renal disease: Secondary | ICD-10-CM

## 2011-12-25 DIAGNOSIS — N2581 Secondary hyperparathyroidism of renal origin: Secondary | ICD-10-CM | POA: Diagnosis not present

## 2011-12-25 DIAGNOSIS — D509 Iron deficiency anemia, unspecified: Secondary | ICD-10-CM | POA: Diagnosis not present

## 2011-12-25 MED ORDER — IOHEXOL 300 MG/ML  SOLN
100.0000 mL | Freq: Once | INTRAMUSCULAR | Status: AC | PRN
  Administered 2011-12-25: 72 mL via INTRAVENOUS

## 2011-12-28 ENCOUNTER — Telehealth (HOSPITAL_COMMUNITY): Payer: Self-pay

## 2011-12-28 DIAGNOSIS — N039 Chronic nephritic syndrome with unspecified morphologic changes: Secondary | ICD-10-CM | POA: Diagnosis not present

## 2011-12-28 DIAGNOSIS — N186 End stage renal disease: Secondary | ICD-10-CM | POA: Diagnosis not present

## 2011-12-28 DIAGNOSIS — D509 Iron deficiency anemia, unspecified: Secondary | ICD-10-CM | POA: Diagnosis not present

## 2011-12-28 DIAGNOSIS — N2581 Secondary hyperparathyroidism of renal origin: Secondary | ICD-10-CM | POA: Diagnosis not present

## 2011-12-28 DIAGNOSIS — Z992 Dependence on renal dialysis: Secondary | ICD-10-CM | POA: Diagnosis not present

## 2011-12-29 DIAGNOSIS — D509 Iron deficiency anemia, unspecified: Secondary | ICD-10-CM | POA: Diagnosis not present

## 2011-12-29 DIAGNOSIS — N186 End stage renal disease: Secondary | ICD-10-CM | POA: Diagnosis not present

## 2011-12-29 DIAGNOSIS — N2581 Secondary hyperparathyroidism of renal origin: Secondary | ICD-10-CM | POA: Diagnosis not present

## 2011-12-29 DIAGNOSIS — Z992 Dependence on renal dialysis: Secondary | ICD-10-CM | POA: Diagnosis not present

## 2011-12-29 DIAGNOSIS — N039 Chronic nephritic syndrome with unspecified morphologic changes: Secondary | ICD-10-CM | POA: Diagnosis not present

## 2011-12-29 DIAGNOSIS — D631 Anemia in chronic kidney disease: Secondary | ICD-10-CM | POA: Diagnosis not present

## 2011-12-30 DIAGNOSIS — D631 Anemia in chronic kidney disease: Secondary | ICD-10-CM | POA: Diagnosis not present

## 2011-12-30 DIAGNOSIS — D509 Iron deficiency anemia, unspecified: Secondary | ICD-10-CM | POA: Diagnosis not present

## 2011-12-30 DIAGNOSIS — N186 End stage renal disease: Secondary | ICD-10-CM | POA: Diagnosis not present

## 2011-12-30 DIAGNOSIS — N2581 Secondary hyperparathyroidism of renal origin: Secondary | ICD-10-CM | POA: Diagnosis not present

## 2011-12-30 DIAGNOSIS — Z992 Dependence on renal dialysis: Secondary | ICD-10-CM | POA: Diagnosis not present

## 2011-12-31 DIAGNOSIS — N039 Chronic nephritic syndrome with unspecified morphologic changes: Secondary | ICD-10-CM | POA: Diagnosis not present

## 2011-12-31 DIAGNOSIS — D509 Iron deficiency anemia, unspecified: Secondary | ICD-10-CM | POA: Diagnosis not present

## 2011-12-31 DIAGNOSIS — N2581 Secondary hyperparathyroidism of renal origin: Secondary | ICD-10-CM | POA: Diagnosis not present

## 2011-12-31 DIAGNOSIS — N186 End stage renal disease: Secondary | ICD-10-CM | POA: Diagnosis not present

## 2011-12-31 DIAGNOSIS — Z992 Dependence on renal dialysis: Secondary | ICD-10-CM | POA: Diagnosis not present

## 2012-01-01 ENCOUNTER — Encounter: Payer: Self-pay | Admitting: Vascular Surgery

## 2012-01-01 ENCOUNTER — Ambulatory Visit (INDEPENDENT_AMBULATORY_CARE_PROVIDER_SITE_OTHER): Payer: Medicare Other | Admitting: Vascular Surgery

## 2012-01-01 VITALS — BP 110/76 | HR 100 | Resp 16 | Ht 72.0 in | Wt 217.7 lb

## 2012-01-01 DIAGNOSIS — N186 End stage renal disease: Secondary | ICD-10-CM | POA: Diagnosis not present

## 2012-01-01 DIAGNOSIS — N2581 Secondary hyperparathyroidism of renal origin: Secondary | ICD-10-CM | POA: Diagnosis not present

## 2012-01-01 DIAGNOSIS — Z992 Dependence on renal dialysis: Secondary | ICD-10-CM | POA: Diagnosis not present

## 2012-01-01 DIAGNOSIS — D631 Anemia in chronic kidney disease: Secondary | ICD-10-CM | POA: Diagnosis not present

## 2012-01-01 DIAGNOSIS — D509 Iron deficiency anemia, unspecified: Secondary | ICD-10-CM | POA: Diagnosis not present

## 2012-01-01 NOTE — Progress Notes (Signed)
[facility]  Established Dialysis Access  History of Present Illness  Jeffrey [name] is a 56 y.o. (1955/09/23) male who presents for re-evaluation of L RC AVF.  The patient is undergoing home hemodialysis with some difficulties with cannulation.  He notes considerable variability in cannulation success dependent on the technician.  He recently underwent a fistulogram of the left RC AVF.  Past Medical History, Past Surgical History, Social History, Family History, Medications, Allergies, and Review of Systems are unchanged from previous visit on 08/21/11.  Physical Examination  Filed Vitals:   01/01/12 1328  BP: 110/76  Pulse: 100  Resp: 16  Height: 6' (1.829 m)  Weight: 217 lb 11.2 oz (98.748 kg)  SpO2: 99%   Body mass index is 29.53 kg/(m^2).  General: A&O x 3, WDWN  Pulmonary: Sym exp, good air movt, CTAB, no rales, rhonchi, & wheezing  Cardiac: RRR, Nl S1, S2, no Murmurs, rubs or gallops  Gastrointestinal: soft, NTND, -G/R, - HSM, - masses, - CVAT B  Musculoskeletal: M/S 5/5 throughout , Extremities without  ischemic changes , L forearm RC AVF with strong thrill and bruit  Neurologic: CN 2-12 intact , Pain and light touch intact in extremities , Motor exam as listed above  Medical Decision Making  Jeffrey [name] is a 56 y.o. male who presents with ESRD requiring hemodialysis.   Based on my review of the pt's recent fistulogram, I would offer him side branch ligation in the L RC AVF.  The patient is somewhat hesitant to proceed, so he would like to give his dialysis technicians an additional chance prior to proceeding.  I had an extensive discussion with this patient in regards to the nature of access surgery, including risk, benefits, and alternatives.    The patient is aware that the risks of access surgery include but are not limited to: bleeding, infection, steal syndrome, nerve damage, ischemic monomelic neuropathy, failure of access to  mature, and possible need for additional access procedures in the future.  We will proceed above procedure once the patient is ready to proceed.  Adele Barthel, MD [facility] Office: [phone] Pager: [phone]  01/01/2012, 5:12 PM

## 2012-01-04 DIAGNOSIS — N2581 Secondary hyperparathyroidism of renal origin: Secondary | ICD-10-CM | POA: Diagnosis not present

## 2012-01-04 DIAGNOSIS — N186 End stage renal disease: Secondary | ICD-10-CM | POA: Diagnosis not present

## 2012-01-04 DIAGNOSIS — Z992 Dependence on renal dialysis: Secondary | ICD-10-CM | POA: Diagnosis not present

## 2012-01-04 DIAGNOSIS — D509 Iron deficiency anemia, unspecified: Secondary | ICD-10-CM | POA: Diagnosis not present

## 2012-01-04 DIAGNOSIS — N039 Chronic nephritic syndrome with unspecified morphologic changes: Secondary | ICD-10-CM | POA: Diagnosis not present

## 2012-01-05 ENCOUNTER — Encounter (HOSPITAL_COMMUNITY): Payer: Self-pay | Admitting: Emergency Medicine

## 2012-01-05 ENCOUNTER — Emergency Department (HOSPITAL_COMMUNITY)
Admission: EM | Admit: 2012-01-05 | Discharge: 2012-01-06 | Disposition: A | Discharge status: Home / Self Care | Payer: Medicare Other | Attending: Emergency Medicine | Admitting: Emergency Medicine

## 2012-01-05 DIAGNOSIS — Z21 Asymptomatic human immunodeficiency virus [HIV] infection status: Secondary | ICD-10-CM | POA: Insufficient documentation

## 2012-01-05 DIAGNOSIS — Z79899 Other long term (current) drug therapy: Secondary | ICD-10-CM | POA: Insufficient documentation

## 2012-01-05 DIAGNOSIS — F411 Generalized anxiety disorder: Secondary | ICD-10-CM | POA: Insufficient documentation

## 2012-01-05 DIAGNOSIS — Z862 Personal history of diseases of the blood and blood-forming organs and certain disorders involving the immune mechanism: Secondary | ICD-10-CM | POA: Insufficient documentation

## 2012-01-05 DIAGNOSIS — N186 End stage renal disease: Secondary | ICD-10-CM | POA: Insufficient documentation

## 2012-01-05 DIAGNOSIS — N039 Chronic nephritic syndrome with unspecified morphologic changes: Secondary | ICD-10-CM | POA: Diagnosis not present

## 2012-01-05 DIAGNOSIS — I120 Hypertensive chronic kidney disease with stage 5 chronic kidney disease or end stage renal disease: Secondary | ICD-10-CM | POA: Insufficient documentation

## 2012-01-05 DIAGNOSIS — D631 Anemia in chronic kidney disease: Secondary | ICD-10-CM | POA: Diagnosis not present

## 2012-01-05 DIAGNOSIS — T82898A Other specified complication of vascular prosthetic devices, implants and grafts, initial encounter: Secondary | ICD-10-CM | POA: Insufficient documentation

## 2012-01-05 DIAGNOSIS — Z992 Dependence on renal dialysis: Secondary | ICD-10-CM | POA: Insufficient documentation

## 2012-01-05 DIAGNOSIS — Y841 Kidney dialysis as the cause of abnormal reaction of the patient, or of later complication, without mention of misadventure at the time of the procedure: Secondary | ICD-10-CM | POA: Insufficient documentation

## 2012-01-05 DIAGNOSIS — Z8639 Personal history of other endocrine, nutritional and metabolic disease: Secondary | ICD-10-CM | POA: Insufficient documentation

## 2012-01-05 NOTE — ED Notes (Signed)
Patient dialysis patient; does dialysis at home.  Patient normally able to feel thrill in his left arm; patient states that after his treatment today, he noticed that his thrill felt different than normal (i.e. Less prominent than normal).  Upon assessment, thrill felt.  Patient denies any other symptoms.

## 2012-01-06 DIAGNOSIS — I120 Hypertensive chronic kidney disease with stage 5 chronic kidney disease or end stage renal disease: Secondary | ICD-10-CM | POA: Diagnosis not present

## 2012-01-06 DIAGNOSIS — F411 Generalized anxiety disorder: Secondary | ICD-10-CM | POA: Diagnosis not present

## 2012-01-06 DIAGNOSIS — N186 End stage renal disease: Secondary | ICD-10-CM | POA: Diagnosis not present

## 2012-01-06 DIAGNOSIS — Z21 Asymptomatic human immunodeficiency virus [HIV] infection status: Secondary | ICD-10-CM | POA: Diagnosis not present

## 2012-01-06 DIAGNOSIS — N039 Chronic nephritic syndrome with unspecified morphologic changes: Secondary | ICD-10-CM | POA: Diagnosis not present

## 2012-01-06 DIAGNOSIS — T82898A Other specified complication of vascular prosthetic devices, implants and grafts, initial encounter: Secondary | ICD-10-CM | POA: Diagnosis not present

## 2012-01-06 DIAGNOSIS — Z992 Dependence on renal dialysis: Secondary | ICD-10-CM | POA: Diagnosis not present

## 2012-01-06 DIAGNOSIS — Z79899 Other long term (current) drug therapy: Secondary | ICD-10-CM | POA: Diagnosis not present

## 2012-01-06 DIAGNOSIS — Z862 Personal history of diseases of the blood and blood-forming organs and certain disorders involving the immune mechanism: Secondary | ICD-10-CM | POA: Diagnosis not present

## 2012-01-06 NOTE — Discharge Instructions (Signed)
Followup with Dr. Bridgett Larsson this week. Your fistula seems to be functioning properly. Return to ED if you develop problems with your fistula.

## 2012-01-06 NOTE — ED Provider Notes (Signed)
History     CSN: [id number]  Arrival date & time 01/05/12  2322   First MD Initiated Contact with Patient 01/06/12 0127      No chief complaint on file.   (Consider location/radiation/quality/duration/timing/severity/associated sxs/prior treatment) HPI Comments: Patient receives dialysis through left forearm fistula face had since September. He received his treatment today from his brother. About an hour later he was feeling his arm and could not feel a thrill. Upon arrival to the ER he could feel a thrill. He denies any pain, chest pain or shortness of breath. He received his full dialysis treatment and denies any other complaints.  The history is provided by the patient.    Past Medical History  Diagnosis Date  . Hypertension   . Gout   . Anemia   . Seizures 06/02/2011  . Thrombocytopenia   . Chronic kidney disease     ESRD requiring hemodialysis  . Anxiety   . Hepatitis B carrier   . HIV infection   . Hemodialysis patient   . Seizure     Past Surgical History  Procedure Date  . Av fistula placement 06/02/11    Left radiocephalic AVF    Family History  Problem Relation Age of Onset  . Hypertension Mother   . Cancer Father     History  Substance Use Topics  . Smoking status: Former Smoker    Types: Cigarettes    Quit date: 06/03/2011  . Smokeless tobacco: Never Used  . Alcohol Use: No      Review of Systems  Constitutional: Negative for activity change and appetite change.  Respiratory: Negative for cough, chest tightness and shortness of breath.   Cardiovascular: Negative for chest pain.  Gastrointestinal: Negative for nausea, vomiting and abdominal pain.  Genitourinary: Negative for dysuria.  Musculoskeletal: Negative for back pain.  Skin: Negative for wound.  Neurological: Negative for headaches.    Allergies  Penicillins  Home Medications   Current Outpatient Rx  Name Route Sig Dispense Refill  . ALBUTEROL SULFATE HFA 108 (90 BASE)  MCG/ACT IN AERS Inhalation Inhale 2 puffs into the lungs every 4 (four) hours as needed for wheezing or shortness of breath. 1 Inhaler 3  . ALLOPURINOL 300 MG PO TABS Oral Take 300 mg by mouth daily.      Marland Kitchen ALPRAZOLAM 0.5 MG PO TABS Oral Take 1 tablet (0.5 mg total) by mouth 2 (two) times daily as needed for anxiety. For anxiety 60 tablet 0  . CALCIUM ACETATE 667 MG PO TABS Oral Take 2 tablets by mouth 3 (three) times daily with meals.      Marland Kitchen CALCIUM GLUCONATE 500 MG PO TABS Oral Take 1 tablet (500 mg total) by mouth daily. 90 tablet 3  . LAMIVUDINE 5 MG/ML PO SOLN Oral Take 25 mg by mouth daily.    Marland Kitchen LIDOCAINE 5 % EX OINT Topical Apply 1 application topically daily.      Marland Kitchen LIDOCAINE-PRILOCAINE 2.5-2.5 % EX CREA Topical Apply 1 application topically daily.      Marland Kitchen RENA-VITE PO TABS Oral Take 1 tablet by mouth daily.      Marland Kitchen PSEUDOEPHEDRINE-APAP-DM 30-325-15 MG PO TABS Oral Take 15 mLs by mouth every 6 (six) hours as needed. For cold symptoms     . RALTEGRAVIR POTASSIUM 400 MG PO TABS Oral Take 400 mg by mouth 2 (two) times daily.      . TENOFOVIR DISOPROXIL FUMARATE 300 MG PO TABS Oral Take 300 mg by mouth once a  week. On Saturdays      BP 112/83  Pulse 86  Temp(Src) 99 F (37.2 C) (Oral)  Resp 20  SpO2 98%  Physical Exam  Constitutional: He appears well-developed and well-nourished. No distress.  HENT:  Head: Normocephalic and atraumatic.  Mouth/Throat: Oropharynx is clear and moist. No oropharyngeal exudate.  Eyes: Conjunctivae and EOM are normal. Pupils are equal, round, and reactive to light.  Neck: Normal range of motion. Neck supple.  Cardiovascular: Normal rate, regular rhythm and normal heart sounds.   Pulmonary/Chest: Effort normal and breath sounds normal. No respiratory distress.  Abdominal: Soft. There is no tenderness. There is no rebound and no guarding.  Musculoskeletal: Normal range of motion.       Left forearm fistula with palpable bruit and thrill. Cardinal hand  movements intact, +2 radial pulse, overlying skin change or cellulitis. No bleeding.  Neurological: No cranial nerve deficit.  Skin: Skin is warm.    ED Course  Procedures (including critical care time)  Labs Reviewed - No data to display No results found.   No diagnosis found.    MDM  Dialysis fistula with palpable bruit and thrill. No abnormality noticed.  Stable for followup with Dr. Bridgett Larsson as needed.        Ezequiel Essex, MD 01/06/12 [phone]

## 2012-01-07 DIAGNOSIS — D631 Anemia in chronic kidney disease: Secondary | ICD-10-CM | POA: Diagnosis not present

## 2012-01-07 DIAGNOSIS — N039 Chronic nephritic syndrome with unspecified morphologic changes: Secondary | ICD-10-CM | POA: Diagnosis not present

## 2012-01-07 DIAGNOSIS — N186 End stage renal disease: Secondary | ICD-10-CM | POA: Diagnosis not present

## 2012-01-08 DIAGNOSIS — N186 End stage renal disease: Secondary | ICD-10-CM | POA: Diagnosis not present

## 2012-01-08 DIAGNOSIS — D631 Anemia in chronic kidney disease: Secondary | ICD-10-CM | POA: Diagnosis not present

## 2012-01-11 DIAGNOSIS — N186 End stage renal disease: Secondary | ICD-10-CM | POA: Diagnosis not present

## 2012-01-11 DIAGNOSIS — D631 Anemia in chronic kidney disease: Secondary | ICD-10-CM | POA: Diagnosis not present

## 2012-01-13 DIAGNOSIS — N039 Chronic nephritic syndrome with unspecified morphologic changes: Secondary | ICD-10-CM | POA: Diagnosis not present

## 2012-01-13 DIAGNOSIS — N186 End stage renal disease: Secondary | ICD-10-CM | POA: Diagnosis not present

## 2012-01-15 DIAGNOSIS — D631 Anemia in chronic kidney disease: Secondary | ICD-10-CM | POA: Diagnosis not present

## 2012-01-15 DIAGNOSIS — N186 End stage renal disease: Secondary | ICD-10-CM | POA: Diagnosis not present

## 2012-01-18 DIAGNOSIS — N039 Chronic nephritic syndrome with unspecified morphologic changes: Secondary | ICD-10-CM | POA: Diagnosis not present

## 2012-01-18 DIAGNOSIS — N186 End stage renal disease: Secondary | ICD-10-CM | POA: Diagnosis not present

## 2012-01-19 DIAGNOSIS — N186 End stage renal disease: Secondary | ICD-10-CM | POA: Diagnosis not present

## 2012-01-20 DIAGNOSIS — N2581 Secondary hyperparathyroidism of renal origin: Secondary | ICD-10-CM | POA: Diagnosis not present

## 2012-01-20 DIAGNOSIS — D509 Iron deficiency anemia, unspecified: Secondary | ICD-10-CM | POA: Diagnosis not present

## 2012-01-20 DIAGNOSIS — I120 Hypertensive chronic kidney disease with stage 5 chronic kidney disease or end stage renal disease: Secondary | ICD-10-CM | POA: Diagnosis not present

## 2012-01-20 DIAGNOSIS — I10 Essential (primary) hypertension: Secondary | ICD-10-CM | POA: Diagnosis not present

## 2012-01-20 DIAGNOSIS — D631 Anemia in chronic kidney disease: Secondary | ICD-10-CM | POA: Diagnosis not present

## 2012-01-20 DIAGNOSIS — N186 End stage renal disease: Secondary | ICD-10-CM | POA: Diagnosis not present

## 2012-01-20 DIAGNOSIS — Z992 Dependence on renal dialysis: Secondary | ICD-10-CM | POA: Diagnosis not present

## 2012-01-21 DIAGNOSIS — Z992 Dependence on renal dialysis: Secondary | ICD-10-CM | POA: Diagnosis not present

## 2012-01-21 DIAGNOSIS — I10 Essential (primary) hypertension: Secondary | ICD-10-CM | POA: Diagnosis not present

## 2012-01-21 DIAGNOSIS — D631 Anemia in chronic kidney disease: Secondary | ICD-10-CM | POA: Diagnosis not present

## 2012-01-21 DIAGNOSIS — N039 Chronic nephritic syndrome with unspecified morphologic changes: Secondary | ICD-10-CM | POA: Diagnosis not present

## 2012-01-21 DIAGNOSIS — D509 Iron deficiency anemia, unspecified: Secondary | ICD-10-CM | POA: Diagnosis not present

## 2012-01-21 DIAGNOSIS — N2581 Secondary hyperparathyroidism of renal origin: Secondary | ICD-10-CM | POA: Diagnosis not present

## 2012-01-21 DIAGNOSIS — N186 End stage renal disease: Secondary | ICD-10-CM | POA: Diagnosis not present

## 2012-01-22 DIAGNOSIS — D509 Iron deficiency anemia, unspecified: Secondary | ICD-10-CM | POA: Diagnosis not present

## 2012-01-22 DIAGNOSIS — Z992 Dependence on renal dialysis: Secondary | ICD-10-CM | POA: Diagnosis not present

## 2012-01-22 DIAGNOSIS — N039 Chronic nephritic syndrome with unspecified morphologic changes: Secondary | ICD-10-CM | POA: Diagnosis not present

## 2012-01-22 DIAGNOSIS — I10 Essential (primary) hypertension: Secondary | ICD-10-CM | POA: Diagnosis not present

## 2012-01-22 DIAGNOSIS — N186 End stage renal disease: Secondary | ICD-10-CM | POA: Diagnosis not present

## 2012-01-22 DIAGNOSIS — N2581 Secondary hyperparathyroidism of renal origin: Secondary | ICD-10-CM | POA: Diagnosis not present

## 2012-01-25 DIAGNOSIS — Z992 Dependence on renal dialysis: Secondary | ICD-10-CM | POA: Diagnosis not present

## 2012-01-25 DIAGNOSIS — I10 Essential (primary) hypertension: Secondary | ICD-10-CM | POA: Diagnosis not present

## 2012-01-25 DIAGNOSIS — N186 End stage renal disease: Secondary | ICD-10-CM | POA: Diagnosis not present

## 2012-01-25 DIAGNOSIS — D509 Iron deficiency anemia, unspecified: Secondary | ICD-10-CM | POA: Diagnosis not present

## 2012-01-25 DIAGNOSIS — N2581 Secondary hyperparathyroidism of renal origin: Secondary | ICD-10-CM | POA: Diagnosis not present

## 2012-01-25 DIAGNOSIS — N039 Chronic nephritic syndrome with unspecified morphologic changes: Secondary | ICD-10-CM | POA: Diagnosis not present

## 2012-01-26 DIAGNOSIS — D509 Iron deficiency anemia, unspecified: Secondary | ICD-10-CM | POA: Diagnosis not present

## 2012-01-26 DIAGNOSIS — D631 Anemia in chronic kidney disease: Secondary | ICD-10-CM | POA: Diagnosis not present

## 2012-01-26 DIAGNOSIS — I10 Essential (primary) hypertension: Secondary | ICD-10-CM | POA: Diagnosis not present

## 2012-01-26 DIAGNOSIS — N186 End stage renal disease: Secondary | ICD-10-CM | POA: Diagnosis not present

## 2012-01-26 DIAGNOSIS — Z992 Dependence on renal dialysis: Secondary | ICD-10-CM | POA: Diagnosis not present

## 2012-01-26 DIAGNOSIS — N2581 Secondary hyperparathyroidism of renal origin: Secondary | ICD-10-CM | POA: Diagnosis not present

## 2012-01-27 DIAGNOSIS — N186 End stage renal disease: Secondary | ICD-10-CM | POA: Diagnosis not present

## 2012-01-27 DIAGNOSIS — Z992 Dependence on renal dialysis: Secondary | ICD-10-CM | POA: Diagnosis not present

## 2012-01-27 DIAGNOSIS — D509 Iron deficiency anemia, unspecified: Secondary | ICD-10-CM | POA: Diagnosis not present

## 2012-01-27 DIAGNOSIS — I10 Essential (primary) hypertension: Secondary | ICD-10-CM | POA: Diagnosis not present

## 2012-01-27 DIAGNOSIS — N2581 Secondary hyperparathyroidism of renal origin: Secondary | ICD-10-CM | POA: Diagnosis not present

## 2012-01-27 DIAGNOSIS — D631 Anemia in chronic kidney disease: Secondary | ICD-10-CM | POA: Diagnosis not present

## 2012-01-28 DIAGNOSIS — D509 Iron deficiency anemia, unspecified: Secondary | ICD-10-CM | POA: Diagnosis not present

## 2012-01-28 DIAGNOSIS — I10 Essential (primary) hypertension: Secondary | ICD-10-CM | POA: Diagnosis not present

## 2012-01-28 DIAGNOSIS — N186 End stage renal disease: Secondary | ICD-10-CM | POA: Diagnosis not present

## 2012-01-28 DIAGNOSIS — Z992 Dependence on renal dialysis: Secondary | ICD-10-CM | POA: Diagnosis not present

## 2012-01-28 DIAGNOSIS — N039 Chronic nephritic syndrome with unspecified morphologic changes: Secondary | ICD-10-CM | POA: Diagnosis not present

## 2012-01-28 DIAGNOSIS — N2581 Secondary hyperparathyroidism of renal origin: Secondary | ICD-10-CM | POA: Diagnosis not present

## 2012-01-29 ENCOUNTER — Encounter (HOSPITAL_COMMUNITY): Payer: Self-pay | Admitting: *Deleted

## 2012-01-29 ENCOUNTER — Emergency Department (HOSPITAL_COMMUNITY)
Admission: EM | Admit: 2012-01-29 | Discharge: 2012-01-29 | Disposition: A | Discharge status: Home / Self Care | Payer: Medicare Other | Attending: Emergency Medicine | Admitting: Emergency Medicine

## 2012-01-29 DIAGNOSIS — I120 Hypertensive chronic kidney disease with stage 5 chronic kidney disease or end stage renal disease: Secondary | ICD-10-CM | POA: Insufficient documentation

## 2012-01-29 DIAGNOSIS — Z992 Dependence on renal dialysis: Secondary | ICD-10-CM | POA: Insufficient documentation

## 2012-01-29 DIAGNOSIS — D631 Anemia in chronic kidney disease: Secondary | ICD-10-CM | POA: Diagnosis not present

## 2012-01-29 DIAGNOSIS — I959 Hypotension, unspecified: Secondary | ICD-10-CM | POA: Diagnosis not present

## 2012-01-29 DIAGNOSIS — R42 Dizziness and giddiness: Secondary | ICD-10-CM | POA: Diagnosis not present

## 2012-01-29 DIAGNOSIS — Z21 Asymptomatic human immunodeficiency virus [HIV] infection status: Secondary | ICD-10-CM | POA: Insufficient documentation

## 2012-01-29 DIAGNOSIS — I10 Essential (primary) hypertension: Secondary | ICD-10-CM | POA: Diagnosis not present

## 2012-01-29 DIAGNOSIS — N2581 Secondary hyperparathyroidism of renal origin: Secondary | ICD-10-CM | POA: Diagnosis not present

## 2012-01-29 DIAGNOSIS — R569 Unspecified convulsions: Secondary | ICD-10-CM | POA: Insufficient documentation

## 2012-01-29 DIAGNOSIS — D509 Iron deficiency anemia, unspecified: Secondary | ICD-10-CM | POA: Diagnosis not present

## 2012-01-29 DIAGNOSIS — N186 End stage renal disease: Secondary | ICD-10-CM | POA: Insufficient documentation

## 2012-01-29 LAB — POCT I-STAT, CHEM 8
HCT: 40 % (ref 39.0–52.0)
Hemoglobin: 13.6 g/dL (ref 13.0–17.0)
Potassium: 3.3 mEq/L — ABNORMAL LOW (ref 3.5–5.1)
Sodium: 128 mEq/L — ABNORMAL LOW (ref 135–145)

## 2012-01-29 MED ORDER — SODIUM CHLORIDE 0.9 % IV BOLUS (SEPSIS): 250.0000 mL | Freq: Once | INTRAVENOUS | Status: DC

## 2012-01-29 NOTE — ED Notes (Signed)
Pt d/c home in NAD. Pt voiced understanding of d/c instructions and follow up care. Pt ambulated with no difficulty, and denied pain or dizziness.

## 2012-01-29 NOTE — ED Notes (Signed)
Patient undressed and in a gown. Cardiac monitor, pulse oximetry, and blood pressure cuff on. 

## 2012-01-29 NOTE — ED Notes (Signed)
This RN and Laurette Schimke, RN attempted IV x3. Unsuccessful. IV team paged for iv start

## 2012-01-29 NOTE — ED Provider Notes (Signed)
History     CSN: [id number]  Arrival date & time 01/29/12  1610   First MD Initiated Contact with Patient 01/29/12 1802      Chief Complaint  Patient presents with  . Hypotension    (Consider location/radiation/quality/duration/timing/severity/associated sxs/prior treatment) HPI History provided by pt.   Pt does home hemodialysis 5 times a week for 3 hours.  Dry weight is 98.5 and weight before dialyzing today was 101.8.  Pt set machine to remove 3L fluid.  Towards the end of his treatment, he began to develop lightheadedness and ear congestion, which has occurred in the past when he becomes hypotensive.  His brother and POA checked his VS and systolic BP 70 and HR [id number].  He gave patient approx 600cc of NS which improved his symptoms and VS.  This has occurred on only one other occasion since he has been on home dialysis but happened multiple times while receiving treatment at dialysis center. Pt currently has no complaints.  Denies recent illnesses including fever, cough, N/V/D and pain.  He has an appointment w/ Dr. Justin Mend, his nephrologist on Monday.    Past Medical History  Diagnosis Date  . Hypertension   . Gout   . Anemia   . Seizures 06/02/2011  . Thrombocytopenia   . Chronic kidney disease     ESRD requiring hemodialysis  . Anxiety   . Hepatitis B carrier   . HIV infection   . Hemodialysis patient   . Seizure     Past Surgical History  Procedure Date  . Av fistula placement 06/02/11    Left radiocephalic AVF    Family History  Problem Relation Age of Onset  . Hypertension Mother   . Cancer Father     History  Substance Use Topics  . Smoking status: Former Smoker    Types: Cigarettes    Quit date: 06/03/2011  . Smokeless tobacco: Never Used  . Alcohol Use: No      Review of Systems  All other systems reviewed and are negative.    Allergies  Penicillins  Home Medications   Current Outpatient Rx  Name Route Sig Dispense Refill  . ALBUTEROL SULFATE  HFA 108 (90 BASE) MCG/ACT IN AERS Inhalation Inhale 2 puffs into the lungs every 4 (four) hours as needed for wheezing or shortness of breath. 1 Inhaler 3  . ALPRAZOLAM 0.5 MG PO TABS Oral Take 1 tablet (0.5 mg total) by mouth 2 (two) times daily as needed for anxiety. For anxiety 60 tablet 0  . CALCIUM ACETATE 667 MG PO TABS Oral Take 2 tablets by mouth 3 (three) times daily with meals.      Marland Kitchen CARVEDILOL 3.125 MG PO TABS Oral Take 3.125 mg by mouth 2 (two) times daily with a meal.    . LAMIVUDINE 5 MG/ML PO SOLN Oral Take 25 mg by mouth daily.    Marland Kitchen LIDOCAINE 5 % EX OINT Topical Apply 1 application topically daily.      Marland Kitchen RENA-VITE PO TABS Oral Take 1 tablet by mouth daily.      Marland Kitchen RALTEGRAVIR POTASSIUM 400 MG PO TABS Oral Take 400 mg by mouth 2 (two) times daily.      . TENOFOVIR DISOPROXIL FUMARATE 300 MG PO TABS Oral Take 300 mg by mouth once a week. On Saturdays      BP 101/72  Pulse 85  Temp(Src) 98.5 F (36.9 C) (Oral)  Resp 12  SpO2 97%  Physical Exam  Nursing note and  vitals reviewed. Constitutional: He is oriented to person, place, and time. He appears well-developed and well-nourished. No distress.       Pt denies lightheadedness when changed from supine to sitting position  HENT:  Head: Normocephalic and atraumatic.  Eyes:       Normal appearance  Neck: Normal range of motion.  Cardiovascular: Normal rate, regular rhythm and intact distal pulses.   Pulmonary/Chest: Effort normal and breath sounds normal. No respiratory distress. He has no rales.  Abdominal: Soft. Bowel sounds are normal. He exhibits no distension. There is no tenderness.  Musculoskeletal: Normal range of motion.       No peripheral edema or calf tenderness  Neurological: He is alert and oriented to person, place, and time.  Skin: Skin is warm and dry. No rash noted.  Psychiatric: He has a normal mood and affect. His behavior is normal.    ED Course  Procedures (including critical care time)  Labs  Reviewed  POCT I-STAT, CHEM 8 - Abnormal; Notable for the following:    Sodium 128 (*)    Potassium 3.3 (*)    Chloride 92 (*)    BUN 42 (*)    Creatinine, Ser 8.70 (*)    Glucose, Bld 104 (*)    Calcium, Ion 1.06 (*)    All other components within normal limits   No results found.   1. Hypotension   2. End stage renal disease       MDM  Pt on home hemodialysis.  Set machine to remove 3L fluid today but became hypotensive and tachycardic w/ associated lightheadedness afterwards.  His brother treated him w/ 600cc NS and pt is currently asymptomatic.  Exam sig for mild hypotension and orthostasis.  Pt did not become lightheaded upon sitting up and standing.  Labs show mild hyponatremia.  Will treat patient w/ 250cc bolus and reassess.  7:44 PM   Nursing staff unable to start a line and IV team has not yet come to the ED.  Pt mildly frustrated and does not feel that fluid resuscitation necessary.  He prefers to go home, monitor his vital signs and symptoms on his own, and administer fluids if necessary.  He has an appt w/ nephrology on Monday.  BP currently 101/70 which pt reports is around his baseline.  D/c'd home.  Return precautions discussed.  8:49 PM         Remer Macho, [place] 01/29/12 2049

## 2012-01-29 NOTE — ED Notes (Signed)
IV team returned call, will be down to begin line shortly.

## 2012-01-29 NOTE — Discharge Instructions (Signed)
Be conservative with the amount of fluid you pull off on Sunday.  Check your vital signs at least once tomorrow as well as on Sunday following your dialysis.  Follow up with Dr. Justin Mend as scheduled.   You should return to the ER if you develop lightheadedness, a passing out spell or blood pressure less than 90.

## 2012-01-29 NOTE — ED Notes (Signed)
Pt states he does home dialysis and states after his tx systolic blood pressure 70 and HR 145. Pt states he feels okay, states he feels a little winded. Pt states he felt faint earlier.

## 2012-01-30 NOTE — ED Provider Notes (Signed)
Medical screening examination/treatment/procedure(s) were performed by non-physician practitioner and as supervising physician I was immediately available for consultation/collaboration.   Mylinda Latina III, MD 01/30/12 [phone]

## 2012-02-01 DIAGNOSIS — D509 Iron deficiency anemia, unspecified: Secondary | ICD-10-CM | POA: Diagnosis not present

## 2012-02-01 DIAGNOSIS — Z992 Dependence on renal dialysis: Secondary | ICD-10-CM | POA: Diagnosis not present

## 2012-02-01 DIAGNOSIS — I10 Essential (primary) hypertension: Secondary | ICD-10-CM | POA: Diagnosis not present

## 2012-02-01 DIAGNOSIS — N2581 Secondary hyperparathyroidism of renal origin: Secondary | ICD-10-CM | POA: Diagnosis not present

## 2012-02-01 DIAGNOSIS — N039 Chronic nephritic syndrome with unspecified morphologic changes: Secondary | ICD-10-CM | POA: Diagnosis not present

## 2012-02-01 DIAGNOSIS — N186 End stage renal disease: Secondary | ICD-10-CM | POA: Diagnosis not present

## 2012-02-02 DIAGNOSIS — Z992 Dependence on renal dialysis: Secondary | ICD-10-CM | POA: Diagnosis not present

## 2012-02-02 DIAGNOSIS — D631 Anemia in chronic kidney disease: Secondary | ICD-10-CM | POA: Diagnosis not present

## 2012-02-02 DIAGNOSIS — I10 Essential (primary) hypertension: Secondary | ICD-10-CM | POA: Diagnosis not present

## 2012-02-02 DIAGNOSIS — N2581 Secondary hyperparathyroidism of renal origin: Secondary | ICD-10-CM | POA: Diagnosis not present

## 2012-02-02 DIAGNOSIS — D509 Iron deficiency anemia, unspecified: Secondary | ICD-10-CM | POA: Diagnosis not present

## 2012-02-02 DIAGNOSIS — N186 End stage renal disease: Secondary | ICD-10-CM | POA: Diagnosis not present

## 2012-02-03 DIAGNOSIS — Z992 Dependence on renal dialysis: Secondary | ICD-10-CM | POA: Diagnosis not present

## 2012-02-03 DIAGNOSIS — D631 Anemia in chronic kidney disease: Secondary | ICD-10-CM | POA: Diagnosis not present

## 2012-02-03 DIAGNOSIS — I10 Essential (primary) hypertension: Secondary | ICD-10-CM | POA: Diagnosis not present

## 2012-02-03 DIAGNOSIS — N2581 Secondary hyperparathyroidism of renal origin: Secondary | ICD-10-CM | POA: Diagnosis not present

## 2012-02-03 DIAGNOSIS — D509 Iron deficiency anemia, unspecified: Secondary | ICD-10-CM | POA: Diagnosis not present

## 2012-02-03 DIAGNOSIS — N186 End stage renal disease: Secondary | ICD-10-CM | POA: Diagnosis not present

## 2012-02-04 DIAGNOSIS — D631 Anemia in chronic kidney disease: Secondary | ICD-10-CM | POA: Diagnosis not present

## 2012-02-04 DIAGNOSIS — I10 Essential (primary) hypertension: Secondary | ICD-10-CM | POA: Diagnosis not present

## 2012-02-04 DIAGNOSIS — N2581 Secondary hyperparathyroidism of renal origin: Secondary | ICD-10-CM | POA: Diagnosis not present

## 2012-02-04 DIAGNOSIS — D509 Iron deficiency anemia, unspecified: Secondary | ICD-10-CM | POA: Diagnosis not present

## 2012-02-04 DIAGNOSIS — Z992 Dependence on renal dialysis: Secondary | ICD-10-CM | POA: Diagnosis not present

## 2012-02-04 DIAGNOSIS — N186 End stage renal disease: Secondary | ICD-10-CM | POA: Diagnosis not present

## 2012-02-05 DIAGNOSIS — I10 Essential (primary) hypertension: Secondary | ICD-10-CM | POA: Diagnosis not present

## 2012-02-05 DIAGNOSIS — N2581 Secondary hyperparathyroidism of renal origin: Secondary | ICD-10-CM | POA: Diagnosis not present

## 2012-02-05 DIAGNOSIS — D509 Iron deficiency anemia, unspecified: Secondary | ICD-10-CM | POA: Diagnosis not present

## 2012-02-05 DIAGNOSIS — D631 Anemia in chronic kidney disease: Secondary | ICD-10-CM | POA: Diagnosis not present

## 2012-02-05 DIAGNOSIS — Z992 Dependence on renal dialysis: Secondary | ICD-10-CM | POA: Diagnosis not present

## 2012-02-05 DIAGNOSIS — N186 End stage renal disease: Secondary | ICD-10-CM | POA: Diagnosis not present

## 2012-02-08 DIAGNOSIS — D509 Iron deficiency anemia, unspecified: Secondary | ICD-10-CM | POA: Diagnosis not present

## 2012-02-08 DIAGNOSIS — I10 Essential (primary) hypertension: Secondary | ICD-10-CM | POA: Diagnosis not present

## 2012-02-08 DIAGNOSIS — D631 Anemia in chronic kidney disease: Secondary | ICD-10-CM | POA: Diagnosis not present

## 2012-02-08 DIAGNOSIS — Z992 Dependence on renal dialysis: Secondary | ICD-10-CM | POA: Diagnosis not present

## 2012-02-08 DIAGNOSIS — N186 End stage renal disease: Secondary | ICD-10-CM | POA: Diagnosis not present

## 2012-02-08 DIAGNOSIS — N2581 Secondary hyperparathyroidism of renal origin: Secondary | ICD-10-CM | POA: Diagnosis not present

## 2012-02-09 DIAGNOSIS — N186 End stage renal disease: Secondary | ICD-10-CM | POA: Diagnosis not present

## 2012-02-09 DIAGNOSIS — I10 Essential (primary) hypertension: Secondary | ICD-10-CM | POA: Diagnosis not present

## 2012-02-09 DIAGNOSIS — Z992 Dependence on renal dialysis: Secondary | ICD-10-CM | POA: Diagnosis not present

## 2012-02-09 DIAGNOSIS — N039 Chronic nephritic syndrome with unspecified morphologic changes: Secondary | ICD-10-CM | POA: Diagnosis not present

## 2012-02-09 DIAGNOSIS — N2581 Secondary hyperparathyroidism of renal origin: Secondary | ICD-10-CM | POA: Diagnosis not present

## 2012-02-09 DIAGNOSIS — D509 Iron deficiency anemia, unspecified: Secondary | ICD-10-CM | POA: Diagnosis not present

## 2012-02-10 DIAGNOSIS — I10 Essential (primary) hypertension: Secondary | ICD-10-CM | POA: Diagnosis not present

## 2012-02-10 DIAGNOSIS — D509 Iron deficiency anemia, unspecified: Secondary | ICD-10-CM | POA: Diagnosis not present

## 2012-02-10 DIAGNOSIS — Z992 Dependence on renal dialysis: Secondary | ICD-10-CM | POA: Diagnosis not present

## 2012-02-10 DIAGNOSIS — N186 End stage renal disease: Secondary | ICD-10-CM | POA: Diagnosis not present

## 2012-02-10 DIAGNOSIS — N2581 Secondary hyperparathyroidism of renal origin: Secondary | ICD-10-CM | POA: Diagnosis not present

## 2012-02-10 DIAGNOSIS — N039 Chronic nephritic syndrome with unspecified morphologic changes: Secondary | ICD-10-CM | POA: Diagnosis not present

## 2012-02-11 DIAGNOSIS — D631 Anemia in chronic kidney disease: Secondary | ICD-10-CM | POA: Diagnosis not present

## 2012-02-11 DIAGNOSIS — I10 Essential (primary) hypertension: Secondary | ICD-10-CM | POA: Diagnosis not present

## 2012-02-11 DIAGNOSIS — N186 End stage renal disease: Secondary | ICD-10-CM | POA: Diagnosis not present

## 2012-02-11 DIAGNOSIS — Z992 Dependence on renal dialysis: Secondary | ICD-10-CM | POA: Diagnosis not present

## 2012-02-11 DIAGNOSIS — D509 Iron deficiency anemia, unspecified: Secondary | ICD-10-CM | POA: Diagnosis not present

## 2012-02-11 DIAGNOSIS — N2581 Secondary hyperparathyroidism of renal origin: Secondary | ICD-10-CM | POA: Diagnosis not present

## 2012-02-12 DIAGNOSIS — I10 Essential (primary) hypertension: Secondary | ICD-10-CM | POA: Diagnosis not present

## 2012-02-12 DIAGNOSIS — N2581 Secondary hyperparathyroidism of renal origin: Secondary | ICD-10-CM | POA: Diagnosis not present

## 2012-02-12 DIAGNOSIS — N186 End stage renal disease: Secondary | ICD-10-CM | POA: Diagnosis not present

## 2012-02-12 DIAGNOSIS — Z992 Dependence on renal dialysis: Secondary | ICD-10-CM | POA: Diagnosis not present

## 2012-02-12 DIAGNOSIS — D509 Iron deficiency anemia, unspecified: Secondary | ICD-10-CM | POA: Diagnosis not present

## 2012-02-12 DIAGNOSIS — N039 Chronic nephritic syndrome with unspecified morphologic changes: Secondary | ICD-10-CM | POA: Diagnosis not present

## 2012-02-15 DIAGNOSIS — N186 End stage renal disease: Secondary | ICD-10-CM | POA: Diagnosis not present

## 2012-02-15 DIAGNOSIS — I10 Essential (primary) hypertension: Secondary | ICD-10-CM | POA: Diagnosis not present

## 2012-02-15 DIAGNOSIS — N2581 Secondary hyperparathyroidism of renal origin: Secondary | ICD-10-CM | POA: Diagnosis not present

## 2012-02-15 DIAGNOSIS — D631 Anemia in chronic kidney disease: Secondary | ICD-10-CM | POA: Diagnosis not present

## 2012-02-15 DIAGNOSIS — Z992 Dependence on renal dialysis: Secondary | ICD-10-CM | POA: Diagnosis not present

## 2012-02-15 DIAGNOSIS — D509 Iron deficiency anemia, unspecified: Secondary | ICD-10-CM | POA: Diagnosis not present

## 2012-02-16 DIAGNOSIS — I10 Essential (primary) hypertension: Secondary | ICD-10-CM | POA: Diagnosis not present

## 2012-02-16 DIAGNOSIS — D631 Anemia in chronic kidney disease: Secondary | ICD-10-CM | POA: Diagnosis not present

## 2012-02-16 DIAGNOSIS — Z992 Dependence on renal dialysis: Secondary | ICD-10-CM | POA: Diagnosis not present

## 2012-02-16 DIAGNOSIS — N2581 Secondary hyperparathyroidism of renal origin: Secondary | ICD-10-CM | POA: Diagnosis not present

## 2012-02-16 DIAGNOSIS — D509 Iron deficiency anemia, unspecified: Secondary | ICD-10-CM | POA: Diagnosis not present

## 2012-02-16 DIAGNOSIS — N186 End stage renal disease: Secondary | ICD-10-CM | POA: Diagnosis not present

## 2012-02-17 DIAGNOSIS — D631 Anemia in chronic kidney disease: Secondary | ICD-10-CM | POA: Diagnosis not present

## 2012-02-17 DIAGNOSIS — D509 Iron deficiency anemia, unspecified: Secondary | ICD-10-CM | POA: Diagnosis not present

## 2012-02-17 DIAGNOSIS — Z992 Dependence on renal dialysis: Secondary | ICD-10-CM | POA: Diagnosis not present

## 2012-02-17 DIAGNOSIS — I10 Essential (primary) hypertension: Secondary | ICD-10-CM | POA: Diagnosis not present

## 2012-02-17 DIAGNOSIS — N2581 Secondary hyperparathyroidism of renal origin: Secondary | ICD-10-CM | POA: Diagnosis not present

## 2012-02-17 DIAGNOSIS — N186 End stage renal disease: Secondary | ICD-10-CM | POA: Diagnosis not present

## 2012-02-18 DIAGNOSIS — Z992 Dependence on renal dialysis: Secondary | ICD-10-CM | POA: Diagnosis not present

## 2012-02-18 DIAGNOSIS — D631 Anemia in chronic kidney disease: Secondary | ICD-10-CM | POA: Diagnosis not present

## 2012-02-18 DIAGNOSIS — N186 End stage renal disease: Secondary | ICD-10-CM | POA: Diagnosis not present

## 2012-02-18 DIAGNOSIS — N2581 Secondary hyperparathyroidism of renal origin: Secondary | ICD-10-CM | POA: Diagnosis not present

## 2012-02-18 DIAGNOSIS — D509 Iron deficiency anemia, unspecified: Secondary | ICD-10-CM | POA: Diagnosis not present

## 2012-02-18 DIAGNOSIS — I10 Essential (primary) hypertension: Secondary | ICD-10-CM | POA: Diagnosis not present

## 2012-02-19 DIAGNOSIS — I10 Essential (primary) hypertension: Secondary | ICD-10-CM | POA: Diagnosis not present

## 2012-02-19 DIAGNOSIS — N186 End stage renal disease: Secondary | ICD-10-CM | POA: Diagnosis not present

## 2012-02-19 DIAGNOSIS — D509 Iron deficiency anemia, unspecified: Secondary | ICD-10-CM | POA: Diagnosis not present

## 2012-02-19 DIAGNOSIS — N2581 Secondary hyperparathyroidism of renal origin: Secondary | ICD-10-CM | POA: Diagnosis not present

## 2012-02-19 DIAGNOSIS — Z992 Dependence on renal dialysis: Secondary | ICD-10-CM | POA: Diagnosis not present

## 2012-02-19 DIAGNOSIS — D631 Anemia in chronic kidney disease: Secondary | ICD-10-CM | POA: Diagnosis not present

## 2012-02-22 DIAGNOSIS — N2581 Secondary hyperparathyroidism of renal origin: Secondary | ICD-10-CM | POA: Diagnosis not present

## 2012-02-22 DIAGNOSIS — Z992 Dependence on renal dialysis: Secondary | ICD-10-CM | POA: Diagnosis not present

## 2012-02-22 DIAGNOSIS — D631 Anemia in chronic kidney disease: Secondary | ICD-10-CM | POA: Diagnosis not present

## 2012-02-22 DIAGNOSIS — N186 End stage renal disease: Secondary | ICD-10-CM | POA: Diagnosis not present

## 2012-02-22 DIAGNOSIS — D509 Iron deficiency anemia, unspecified: Secondary | ICD-10-CM | POA: Diagnosis not present

## 2012-03-02 DIAGNOSIS — N186 End stage renal disease: Secondary | ICD-10-CM | POA: Diagnosis not present

## 2012-03-02 DIAGNOSIS — E039 Hypothyroidism, unspecified: Secondary | ICD-10-CM | POA: Diagnosis not present

## 2012-03-20 DIAGNOSIS — N186 End stage renal disease: Secondary | ICD-10-CM | POA: Diagnosis not present

## 2012-03-21 DIAGNOSIS — N186 End stage renal disease: Secondary | ICD-10-CM | POA: Diagnosis not present

## 2012-03-21 DIAGNOSIS — N2581 Secondary hyperparathyroidism of renal origin: Secondary | ICD-10-CM | POA: Diagnosis not present

## 2012-03-21 DIAGNOSIS — D631 Anemia in chronic kidney disease: Secondary | ICD-10-CM | POA: Diagnosis not present

## 2012-03-21 DIAGNOSIS — D509 Iron deficiency anemia, unspecified: Secondary | ICD-10-CM | POA: Diagnosis not present

## 2012-03-21 DIAGNOSIS — Z992 Dependence on renal dialysis: Secondary | ICD-10-CM | POA: Diagnosis not present

## 2012-04-20 DIAGNOSIS — N186 End stage renal disease: Secondary | ICD-10-CM | POA: Diagnosis not present

## 2012-04-21 DIAGNOSIS — Z992 Dependence on renal dialysis: Secondary | ICD-10-CM | POA: Diagnosis not present

## 2012-04-21 DIAGNOSIS — N186 End stage renal disease: Secondary | ICD-10-CM | POA: Diagnosis not present

## 2012-04-21 DIAGNOSIS — N2581 Secondary hyperparathyroidism of renal origin: Secondary | ICD-10-CM | POA: Diagnosis not present

## 2012-04-21 DIAGNOSIS — D509 Iron deficiency anemia, unspecified: Secondary | ICD-10-CM | POA: Diagnosis not present

## 2012-04-21 DIAGNOSIS — D631 Anemia in chronic kidney disease: Secondary | ICD-10-CM | POA: Diagnosis not present

## 2012-04-28 ENCOUNTER — Encounter: Payer: Self-pay | Admitting: Vascular Surgery

## 2012-04-29 ENCOUNTER — Ambulatory Visit: Payer: Medicare Other | Admitting: Vascular Surgery

## 2012-05-17 ENCOUNTER — Other Ambulatory Visit: Payer: Self-pay | Admitting: Licensed Clinical Social Worker

## 2012-05-17 DIAGNOSIS — F419 Anxiety disorder, unspecified: Secondary | ICD-10-CM

## 2012-05-17 MED ORDER — ALPRAZOLAM 0.5 MG PO TABS: 0.5000 mg | ORAL_TABLET | Freq: Two times a day (BID) | ORAL | Status: DC | PRN

## 2012-05-21 ENCOUNTER — Other Ambulatory Visit: Payer: Self-pay | Admitting: Infectious Diseases

## 2012-05-21 DIAGNOSIS — N186 End stage renal disease: Secondary | ICD-10-CM | POA: Diagnosis not present

## 2012-05-23 DIAGNOSIS — D631 Anemia in chronic kidney disease: Secondary | ICD-10-CM | POA: Diagnosis not present

## 2012-05-23 DIAGNOSIS — N2581 Secondary hyperparathyroidism of renal origin: Secondary | ICD-10-CM | POA: Diagnosis not present

## 2012-05-23 DIAGNOSIS — D509 Iron deficiency anemia, unspecified: Secondary | ICD-10-CM | POA: Diagnosis not present

## 2012-05-23 DIAGNOSIS — N039 Chronic nephritic syndrome with unspecified morphologic changes: Secondary | ICD-10-CM | POA: Diagnosis not present

## 2012-05-23 DIAGNOSIS — N186 End stage renal disease: Secondary | ICD-10-CM | POA: Diagnosis not present

## 2012-05-25 DIAGNOSIS — N039 Chronic nephritic syndrome with unspecified morphologic changes: Secondary | ICD-10-CM | POA: Diagnosis not present

## 2012-05-25 DIAGNOSIS — N2581 Secondary hyperparathyroidism of renal origin: Secondary | ICD-10-CM | POA: Diagnosis not present

## 2012-05-25 DIAGNOSIS — E039 Hypothyroidism, unspecified: Secondary | ICD-10-CM | POA: Diagnosis not present

## 2012-05-25 DIAGNOSIS — N186 End stage renal disease: Secondary | ICD-10-CM | POA: Diagnosis not present

## 2012-05-25 DIAGNOSIS — D509 Iron deficiency anemia, unspecified: Secondary | ICD-10-CM | POA: Diagnosis not present

## 2012-05-27 DIAGNOSIS — N2581 Secondary hyperparathyroidism of renal origin: Secondary | ICD-10-CM | POA: Diagnosis not present

## 2012-05-27 DIAGNOSIS — D509 Iron deficiency anemia, unspecified: Secondary | ICD-10-CM | POA: Diagnosis not present

## 2012-05-27 DIAGNOSIS — N186 End stage renal disease: Secondary | ICD-10-CM | POA: Diagnosis not present

## 2012-05-27 DIAGNOSIS — N039 Chronic nephritic syndrome with unspecified morphologic changes: Secondary | ICD-10-CM | POA: Diagnosis not present

## 2012-05-30 DIAGNOSIS — N039 Chronic nephritic syndrome with unspecified morphologic changes: Secondary | ICD-10-CM | POA: Diagnosis not present

## 2012-05-30 DIAGNOSIS — N186 End stage renal disease: Secondary | ICD-10-CM | POA: Diagnosis not present

## 2012-05-30 DIAGNOSIS — D509 Iron deficiency anemia, unspecified: Secondary | ICD-10-CM | POA: Diagnosis not present

## 2012-05-30 DIAGNOSIS — N2581 Secondary hyperparathyroidism of renal origin: Secondary | ICD-10-CM | POA: Diagnosis not present

## 2012-05-31 ENCOUNTER — Other Ambulatory Visit: Payer: Medicare Other

## 2012-05-31 ENCOUNTER — Encounter: Payer: Self-pay | Admitting: Internal Medicine

## 2012-05-31 ENCOUNTER — Ambulatory Visit (INDEPENDENT_AMBULATORY_CARE_PROVIDER_SITE_OTHER): Payer: Medicare Other | Admitting: Internal Medicine

## 2012-05-31 VITALS — BP 158/93 | HR 91 | Temp 98.2°F | Ht 72.0 in | Wt 232.0 lb

## 2012-05-31 DIAGNOSIS — J069 Acute upper respiratory infection, unspecified: Secondary | ICD-10-CM | POA: Diagnosis not present

## 2012-05-31 DIAGNOSIS — B20 Human immunodeficiency virus [HIV] disease: Secondary | ICD-10-CM | POA: Diagnosis not present

## 2012-05-31 LAB — COMPREHENSIVE METABOLIC PANEL
ALT: 9 U/L (ref 0–53)
AST: 16 U/L (ref 0–37)
Albumin: 4.2 g/dL (ref 3.5–5.2)
Calcium: 8.2 mg/dL — ABNORMAL LOW (ref 8.4–10.5)
Chloride: 90 mEq/L — ABNORMAL LOW (ref 96–112)
Potassium: 3.5 mEq/L (ref 3.5–5.3)

## 2012-05-31 LAB — CBC
Platelets: 170 10*3/uL (ref 150–400)
RDW: 15.6 % — ABNORMAL HIGH (ref 11.5–15.5)
WBC: 6.1 10*3/uL (ref 4.0–10.5)

## 2012-05-31 MED ORDER — HYDROXYZINE HCL 10 MG PO TABS: 5.0000 mg | ORAL_TABLET | Freq: Three times a day (TID) | ORAL | Status: DC | PRN

## 2012-05-31 NOTE — Progress Notes (Signed)
  Subjective:    Patient ID: Jeffrey [name], male    DOB: 08-26-1956, 56 y.o.   MRN: [id number]  HPI He comes in here today for a work in visit. He recently had symptoms of a URI including congestion and cough, did get a Z-Pak, and symptoms essentially resolved though he does have persistent nasal congestion and some cough related to postnasal drip. He denies any fever, no chills, no significant productive cough, no sputum color change or any significant sputum. He is up-to-date with his Pneumovax.   Review of Systems  Constitutional: Negative for fever, chills, activity change and appetite change.  HENT: Positive for congestion, rhinorrhea and postnasal drip. Negative for sinus pressure.   Respiratory: Negative for cough, chest tightness, shortness of breath and wheezing.   Gastrointestinal: Negative for nausea.       Objective:   Physical Exam  Constitutional: He appears well-developed and well-nourished. No distress.  Cardiovascular: Normal rate, regular rhythm and normal heart sounds.   Pulmonary/Chest: Effort normal and breath sounds normal. No respiratory distress. He has no wheezes. He has no rales.          Assessment & Plan:

## 2012-05-31 NOTE — Assessment & Plan Note (Signed)
He does have symptoms of a post URI nasal congestion. I did give him really dosed H2 blocker to dry his congestion. I discussed side effects including drowsiness with the medication. He is to return for his usual visit in 2 weeks and knows to return sooner if he has any worsening symptoms. There is no signs of pneumonia with a normal exam, no fever or chills.

## 2012-06-01 ENCOUNTER — Telehealth: Payer: Self-pay | Admitting: Internal Medicine

## 2012-06-01 DIAGNOSIS — N186 End stage renal disease: Secondary | ICD-10-CM | POA: Diagnosis not present

## 2012-06-01 DIAGNOSIS — D631 Anemia in chronic kidney disease: Secondary | ICD-10-CM | POA: Diagnosis not present

## 2012-06-01 DIAGNOSIS — N2581 Secondary hyperparathyroidism of renal origin: Secondary | ICD-10-CM | POA: Diagnosis not present

## 2012-06-01 DIAGNOSIS — D509 Iron deficiency anemia, unspecified: Secondary | ICD-10-CM | POA: Diagnosis not present

## 2012-06-01 LAB — HIV-1 RNA QUANT-NO REFLEX-BLD
HIV 1 RNA Quant: <20 copies/mL (ref <20)
HIV-1 RNA Quant, Log: <1.3 {Log} (ref <1.30)

## 2012-06-01 NOTE — Telephone Encounter (Signed)
Make a call from [facility]  lab for critical lab value of creatinine 13.8. Reviewed previous creatinine results- been consistently high- due to ESRD. Unclear if needs overnight workup. Will send the note to Dr. Johnnye Sima.

## 2012-06-03 DIAGNOSIS — N2581 Secondary hyperparathyroidism of renal origin: Secondary | ICD-10-CM | POA: Diagnosis not present

## 2012-06-03 DIAGNOSIS — D509 Iron deficiency anemia, unspecified: Secondary | ICD-10-CM | POA: Diagnosis not present

## 2012-06-03 DIAGNOSIS — D631 Anemia in chronic kidney disease: Secondary | ICD-10-CM | POA: Diagnosis not present

## 2012-06-03 DIAGNOSIS — N186 End stage renal disease: Secondary | ICD-10-CM | POA: Diagnosis not present

## 2012-06-06 DIAGNOSIS — N186 End stage renal disease: Secondary | ICD-10-CM | POA: Diagnosis not present

## 2012-06-06 DIAGNOSIS — N039 Chronic nephritic syndrome with unspecified morphologic changes: Secondary | ICD-10-CM | POA: Diagnosis not present

## 2012-06-06 DIAGNOSIS — N2581 Secondary hyperparathyroidism of renal origin: Secondary | ICD-10-CM | POA: Diagnosis not present

## 2012-06-06 DIAGNOSIS — D509 Iron deficiency anemia, unspecified: Secondary | ICD-10-CM | POA: Diagnosis not present

## 2012-06-07 ENCOUNTER — Other Ambulatory Visit: Payer: Self-pay | Admitting: *Deleted

## 2012-06-07 DIAGNOSIS — Z113 Encounter for screening for infections with a predominantly sexual mode of transmission: Secondary | ICD-10-CM

## 2012-06-08 DIAGNOSIS — N186 End stage renal disease: Secondary | ICD-10-CM | POA: Diagnosis not present

## 2012-06-08 DIAGNOSIS — D509 Iron deficiency anemia, unspecified: Secondary | ICD-10-CM | POA: Diagnosis not present

## 2012-06-08 DIAGNOSIS — N2581 Secondary hyperparathyroidism of renal origin: Secondary | ICD-10-CM | POA: Diagnosis not present

## 2012-06-08 DIAGNOSIS — D631 Anemia in chronic kidney disease: Secondary | ICD-10-CM | POA: Diagnosis not present

## 2012-06-10 DIAGNOSIS — D509 Iron deficiency anemia, unspecified: Secondary | ICD-10-CM | POA: Diagnosis not present

## 2012-06-10 DIAGNOSIS — N039 Chronic nephritic syndrome with unspecified morphologic changes: Secondary | ICD-10-CM | POA: Diagnosis not present

## 2012-06-10 DIAGNOSIS — N2581 Secondary hyperparathyroidism of renal origin: Secondary | ICD-10-CM | POA: Diagnosis not present

## 2012-06-10 DIAGNOSIS — N186 End stage renal disease: Secondary | ICD-10-CM | POA: Diagnosis not present

## 2012-06-13 DIAGNOSIS — N186 End stage renal disease: Secondary | ICD-10-CM | POA: Diagnosis not present

## 2012-06-13 DIAGNOSIS — N2581 Secondary hyperparathyroidism of renal origin: Secondary | ICD-10-CM | POA: Diagnosis not present

## 2012-06-13 DIAGNOSIS — D509 Iron deficiency anemia, unspecified: Secondary | ICD-10-CM | POA: Diagnosis not present

## 2012-06-13 DIAGNOSIS — D631 Anemia in chronic kidney disease: Secondary | ICD-10-CM | POA: Diagnosis not present

## 2012-06-15 ENCOUNTER — Ambulatory Visit (INDEPENDENT_AMBULATORY_CARE_PROVIDER_SITE_OTHER): Payer: Medicare Other | Admitting: Infectious Diseases

## 2012-06-15 ENCOUNTER — Encounter: Payer: Self-pay | Admitting: Infectious Diseases

## 2012-06-15 VITALS — BP 161/92 | HR 91 | Temp 98.4°F | Ht 72.0 in | Wt 237.0 lb

## 2012-06-15 DIAGNOSIS — Z79899 Other long term (current) drug therapy: Secondary | ICD-10-CM | POA: Diagnosis not present

## 2012-06-15 DIAGNOSIS — B20 Human immunodeficiency virus [HIV] disease: Secondary | ICD-10-CM | POA: Diagnosis not present

## 2012-06-15 DIAGNOSIS — Z113 Encounter for screening for infections with a predominantly sexual mode of transmission: Secondary | ICD-10-CM | POA: Diagnosis not present

## 2012-06-15 DIAGNOSIS — N2581 Secondary hyperparathyroidism of renal origin: Secondary | ICD-10-CM | POA: Diagnosis not present

## 2012-06-15 DIAGNOSIS — N186 End stage renal disease: Secondary | ICD-10-CM

## 2012-06-15 DIAGNOSIS — D509 Iron deficiency anemia, unspecified: Secondary | ICD-10-CM | POA: Diagnosis not present

## 2012-06-15 DIAGNOSIS — N039 Chronic nephritic syndrome with unspecified morphologic changes: Secondary | ICD-10-CM | POA: Diagnosis not present

## 2012-06-15 NOTE — Assessment & Plan Note (Signed)
Greatly appreciate Narda Amber kidney f/u. Can he get flu shot at next visit?

## 2012-06-15 NOTE — Progress Notes (Signed)
  Subjective:    Patient ID: Jeffrey [name], male    DOB: 10/13/1955, 56 y.o.   MRN: [id number]  HPI 56 yo M with HIV+, ESRD, and admission December 2012 with Influenza and his HD catheter was infected. Currently on TFV/3TC/ISN. Has been doing HD at home.  Seen 05-31-12 with URI, thought to have GERD, started on renally adjusted H2 blocker. This has helped him.   HIV 1 RNA Quant (copies/mL)  Date Value  05/31/2012 <20   11/25/2011 <20   08/26/2011 <20      CD4 T Cell Abs (cmm)  Date Value  05/31/2012 290*  11/25/2011 320*  08/26/2011 400       Review of Systems  Constitutional: Negative for appetite change and unexpected weight change.  Gastrointestinal: Negative for diarrhea and constipation.  Genitourinary: Negative for dysuria.       Objective:   Physical Exam  Constitutional: He appears well-developed and well-nourished.  HENT:  Mouth/Throat: No oropharyngeal exudate.  Eyes: EOM are normal. Pupils are equal, round, and reactive to light.  Cardiovascular: Normal rate, regular rhythm and normal heart sounds.   Pulmonary/Chest: Effort normal and breath sounds normal.  Abdominal: Soft. Bowel sounds are normal. There is no tenderness.  Musculoskeletal: He exhibits no edema.  Lymphadenopathy:    He has no cervical adenopathy.  Skin:       LUE AVF +thrill          Assessment & Plan:

## 2012-06-15 NOTE — Assessment & Plan Note (Signed)
Refuses flu. VL <20 but CD4 is slowly decreasing. WIll cont to watch. Offered/refused condoms. Offered/refused flu shot. Suggested he get at next renal visit.  rtc 5 months

## 2012-06-17 DIAGNOSIS — D509 Iron deficiency anemia, unspecified: Secondary | ICD-10-CM | POA: Diagnosis not present

## 2012-06-17 DIAGNOSIS — N2581 Secondary hyperparathyroidism of renal origin: Secondary | ICD-10-CM | POA: Diagnosis not present

## 2012-06-17 DIAGNOSIS — D631 Anemia in chronic kidney disease: Secondary | ICD-10-CM | POA: Diagnosis not present

## 2012-06-17 DIAGNOSIS — N186 End stage renal disease: Secondary | ICD-10-CM | POA: Diagnosis not present

## 2012-06-20 DIAGNOSIS — D631 Anemia in chronic kidney disease: Secondary | ICD-10-CM | POA: Diagnosis not present

## 2012-06-20 DIAGNOSIS — N039 Chronic nephritic syndrome with unspecified morphologic changes: Secondary | ICD-10-CM | POA: Diagnosis not present

## 2012-06-20 DIAGNOSIS — N2581 Secondary hyperparathyroidism of renal origin: Secondary | ICD-10-CM | POA: Diagnosis not present

## 2012-06-20 DIAGNOSIS — N186 End stage renal disease: Secondary | ICD-10-CM | POA: Diagnosis not present

## 2012-06-20 DIAGNOSIS — D509 Iron deficiency anemia, unspecified: Secondary | ICD-10-CM | POA: Diagnosis not present

## 2012-06-21 DIAGNOSIS — D509 Iron deficiency anemia, unspecified: Secondary | ICD-10-CM | POA: Diagnosis not present

## 2012-06-21 DIAGNOSIS — N186 End stage renal disease: Secondary | ICD-10-CM | POA: Diagnosis not present

## 2012-06-21 DIAGNOSIS — D631 Anemia in chronic kidney disease: Secondary | ICD-10-CM | POA: Diagnosis not present

## 2012-06-21 DIAGNOSIS — N2581 Secondary hyperparathyroidism of renal origin: Secondary | ICD-10-CM | POA: Diagnosis not present

## 2012-06-27 ENCOUNTER — Other Ambulatory Visit: Payer: Self-pay | Admitting: Licensed Clinical Social Worker

## 2012-06-27 DIAGNOSIS — B20 Human immunodeficiency virus [HIV] disease: Secondary | ICD-10-CM

## 2012-06-27 MED ORDER — RALTEGRAVIR POTASSIUM 400 MG PO TABS: 400.0000 mg | ORAL_TABLET | Freq: Two times a day (BID) | ORAL | Status: DC

## 2012-06-30 ENCOUNTER — Other Ambulatory Visit: Payer: Self-pay

## 2012-06-30 DIAGNOSIS — F419 Anxiety disorder, unspecified: Secondary | ICD-10-CM

## 2012-06-30 MED ORDER — ALPRAZOLAM 0.5 MG PO TABS: 0.5000 mg | ORAL_TABLET | Freq: Two times a day (BID) | ORAL | Status: DC | PRN

## 2012-07-21 DIAGNOSIS — N186 End stage renal disease: Secondary | ICD-10-CM | POA: Diagnosis not present

## 2012-07-22 DIAGNOSIS — N186 End stage renal disease: Secondary | ICD-10-CM | POA: Diagnosis not present

## 2012-07-22 DIAGNOSIS — N2581 Secondary hyperparathyroidism of renal origin: Secondary | ICD-10-CM | POA: Diagnosis not present

## 2012-07-22 DIAGNOSIS — N039 Chronic nephritic syndrome with unspecified morphologic changes: Secondary | ICD-10-CM | POA: Diagnosis not present

## 2012-07-22 DIAGNOSIS — D631 Anemia in chronic kidney disease: Secondary | ICD-10-CM | POA: Diagnosis not present

## 2012-07-22 DIAGNOSIS — D509 Iron deficiency anemia, unspecified: Secondary | ICD-10-CM | POA: Diagnosis not present

## 2012-07-24 DIAGNOSIS — N186 End stage renal disease: Secondary | ICD-10-CM | POA: Diagnosis not present

## 2012-07-24 DIAGNOSIS — D631 Anemia in chronic kidney disease: Secondary | ICD-10-CM | POA: Diagnosis not present

## 2012-07-24 DIAGNOSIS — D509 Iron deficiency anemia, unspecified: Secondary | ICD-10-CM | POA: Diagnosis not present

## 2012-07-24 DIAGNOSIS — N2581 Secondary hyperparathyroidism of renal origin: Secondary | ICD-10-CM | POA: Diagnosis not present

## 2012-07-25 DIAGNOSIS — D509 Iron deficiency anemia, unspecified: Secondary | ICD-10-CM | POA: Diagnosis not present

## 2012-07-25 DIAGNOSIS — D631 Anemia in chronic kidney disease: Secondary | ICD-10-CM | POA: Diagnosis not present

## 2012-07-25 DIAGNOSIS — N2581 Secondary hyperparathyroidism of renal origin: Secondary | ICD-10-CM | POA: Diagnosis not present

## 2012-07-25 DIAGNOSIS — N186 End stage renal disease: Secondary | ICD-10-CM | POA: Diagnosis not present

## 2012-07-27 DIAGNOSIS — N186 End stage renal disease: Secondary | ICD-10-CM | POA: Diagnosis not present

## 2012-07-27 DIAGNOSIS — N039 Chronic nephritic syndrome with unspecified morphologic changes: Secondary | ICD-10-CM | POA: Diagnosis not present

## 2012-07-27 DIAGNOSIS — N2581 Secondary hyperparathyroidism of renal origin: Secondary | ICD-10-CM | POA: Diagnosis not present

## 2012-07-27 DIAGNOSIS — D509 Iron deficiency anemia, unspecified: Secondary | ICD-10-CM | POA: Diagnosis not present

## 2012-07-29 ENCOUNTER — Other Ambulatory Visit: Payer: Self-pay | Admitting: Infectious Diseases

## 2012-07-29 DIAGNOSIS — D631 Anemia in chronic kidney disease: Secondary | ICD-10-CM | POA: Diagnosis not present

## 2012-07-29 DIAGNOSIS — N2581 Secondary hyperparathyroidism of renal origin: Secondary | ICD-10-CM | POA: Diagnosis not present

## 2012-07-29 DIAGNOSIS — N186 End stage renal disease: Secondary | ICD-10-CM | POA: Diagnosis not present

## 2012-07-29 DIAGNOSIS — D509 Iron deficiency anemia, unspecified: Secondary | ICD-10-CM | POA: Diagnosis not present

## 2012-07-31 ENCOUNTER — Other Ambulatory Visit: Payer: Self-pay | Admitting: Infectious Diseases

## 2012-07-31 DIAGNOSIS — N2581 Secondary hyperparathyroidism of renal origin: Secondary | ICD-10-CM | POA: Diagnosis not present

## 2012-07-31 DIAGNOSIS — D509 Iron deficiency anemia, unspecified: Secondary | ICD-10-CM | POA: Diagnosis not present

## 2012-07-31 DIAGNOSIS — N186 End stage renal disease: Secondary | ICD-10-CM | POA: Diagnosis not present

## 2012-07-31 DIAGNOSIS — D631 Anemia in chronic kidney disease: Secondary | ICD-10-CM | POA: Diagnosis not present

## 2012-08-03 DIAGNOSIS — D509 Iron deficiency anemia, unspecified: Secondary | ICD-10-CM | POA: Diagnosis not present

## 2012-08-03 DIAGNOSIS — D631 Anemia in chronic kidney disease: Secondary | ICD-10-CM | POA: Diagnosis not present

## 2012-08-03 DIAGNOSIS — N2581 Secondary hyperparathyroidism of renal origin: Secondary | ICD-10-CM | POA: Diagnosis not present

## 2012-08-03 DIAGNOSIS — N186 End stage renal disease: Secondary | ICD-10-CM | POA: Diagnosis not present

## 2012-08-05 DIAGNOSIS — D509 Iron deficiency anemia, unspecified: Secondary | ICD-10-CM | POA: Diagnosis not present

## 2012-08-05 DIAGNOSIS — D631 Anemia in chronic kidney disease: Secondary | ICD-10-CM | POA: Diagnosis not present

## 2012-08-05 DIAGNOSIS — N2581 Secondary hyperparathyroidism of renal origin: Secondary | ICD-10-CM | POA: Diagnosis not present

## 2012-08-05 DIAGNOSIS — N186 End stage renal disease: Secondary | ICD-10-CM | POA: Diagnosis not present

## 2012-08-08 DIAGNOSIS — D509 Iron deficiency anemia, unspecified: Secondary | ICD-10-CM | POA: Diagnosis not present

## 2012-08-08 DIAGNOSIS — N2581 Secondary hyperparathyroidism of renal origin: Secondary | ICD-10-CM | POA: Diagnosis not present

## 2012-08-08 DIAGNOSIS — N186 End stage renal disease: Secondary | ICD-10-CM | POA: Diagnosis not present

## 2012-08-08 DIAGNOSIS — N039 Chronic nephritic syndrome with unspecified morphologic changes: Secondary | ICD-10-CM | POA: Diagnosis not present

## 2012-08-10 DIAGNOSIS — N039 Chronic nephritic syndrome with unspecified morphologic changes: Secondary | ICD-10-CM | POA: Diagnosis not present

## 2012-08-10 DIAGNOSIS — N2581 Secondary hyperparathyroidism of renal origin: Secondary | ICD-10-CM | POA: Diagnosis not present

## 2012-08-10 DIAGNOSIS — N186 End stage renal disease: Secondary | ICD-10-CM | POA: Diagnosis not present

## 2012-08-10 DIAGNOSIS — D509 Iron deficiency anemia, unspecified: Secondary | ICD-10-CM | POA: Diagnosis not present

## 2012-08-12 DIAGNOSIS — D509 Iron deficiency anemia, unspecified: Secondary | ICD-10-CM | POA: Diagnosis not present

## 2012-08-12 DIAGNOSIS — N2581 Secondary hyperparathyroidism of renal origin: Secondary | ICD-10-CM | POA: Diagnosis not present

## 2012-08-12 DIAGNOSIS — D631 Anemia in chronic kidney disease: Secondary | ICD-10-CM | POA: Diagnosis not present

## 2012-08-12 DIAGNOSIS — N186 End stage renal disease: Secondary | ICD-10-CM | POA: Diagnosis not present

## 2012-08-12 IMAGING — CR DG CHEST 2V
2 series · 2 of 2 positions shown · non-contrast
Comparison: 11/03/2006

CLINICAL DATA: Mid and left-sided chest pain.  Shortness of breath.

CHEST - 2 VIEW

[w chest pa]
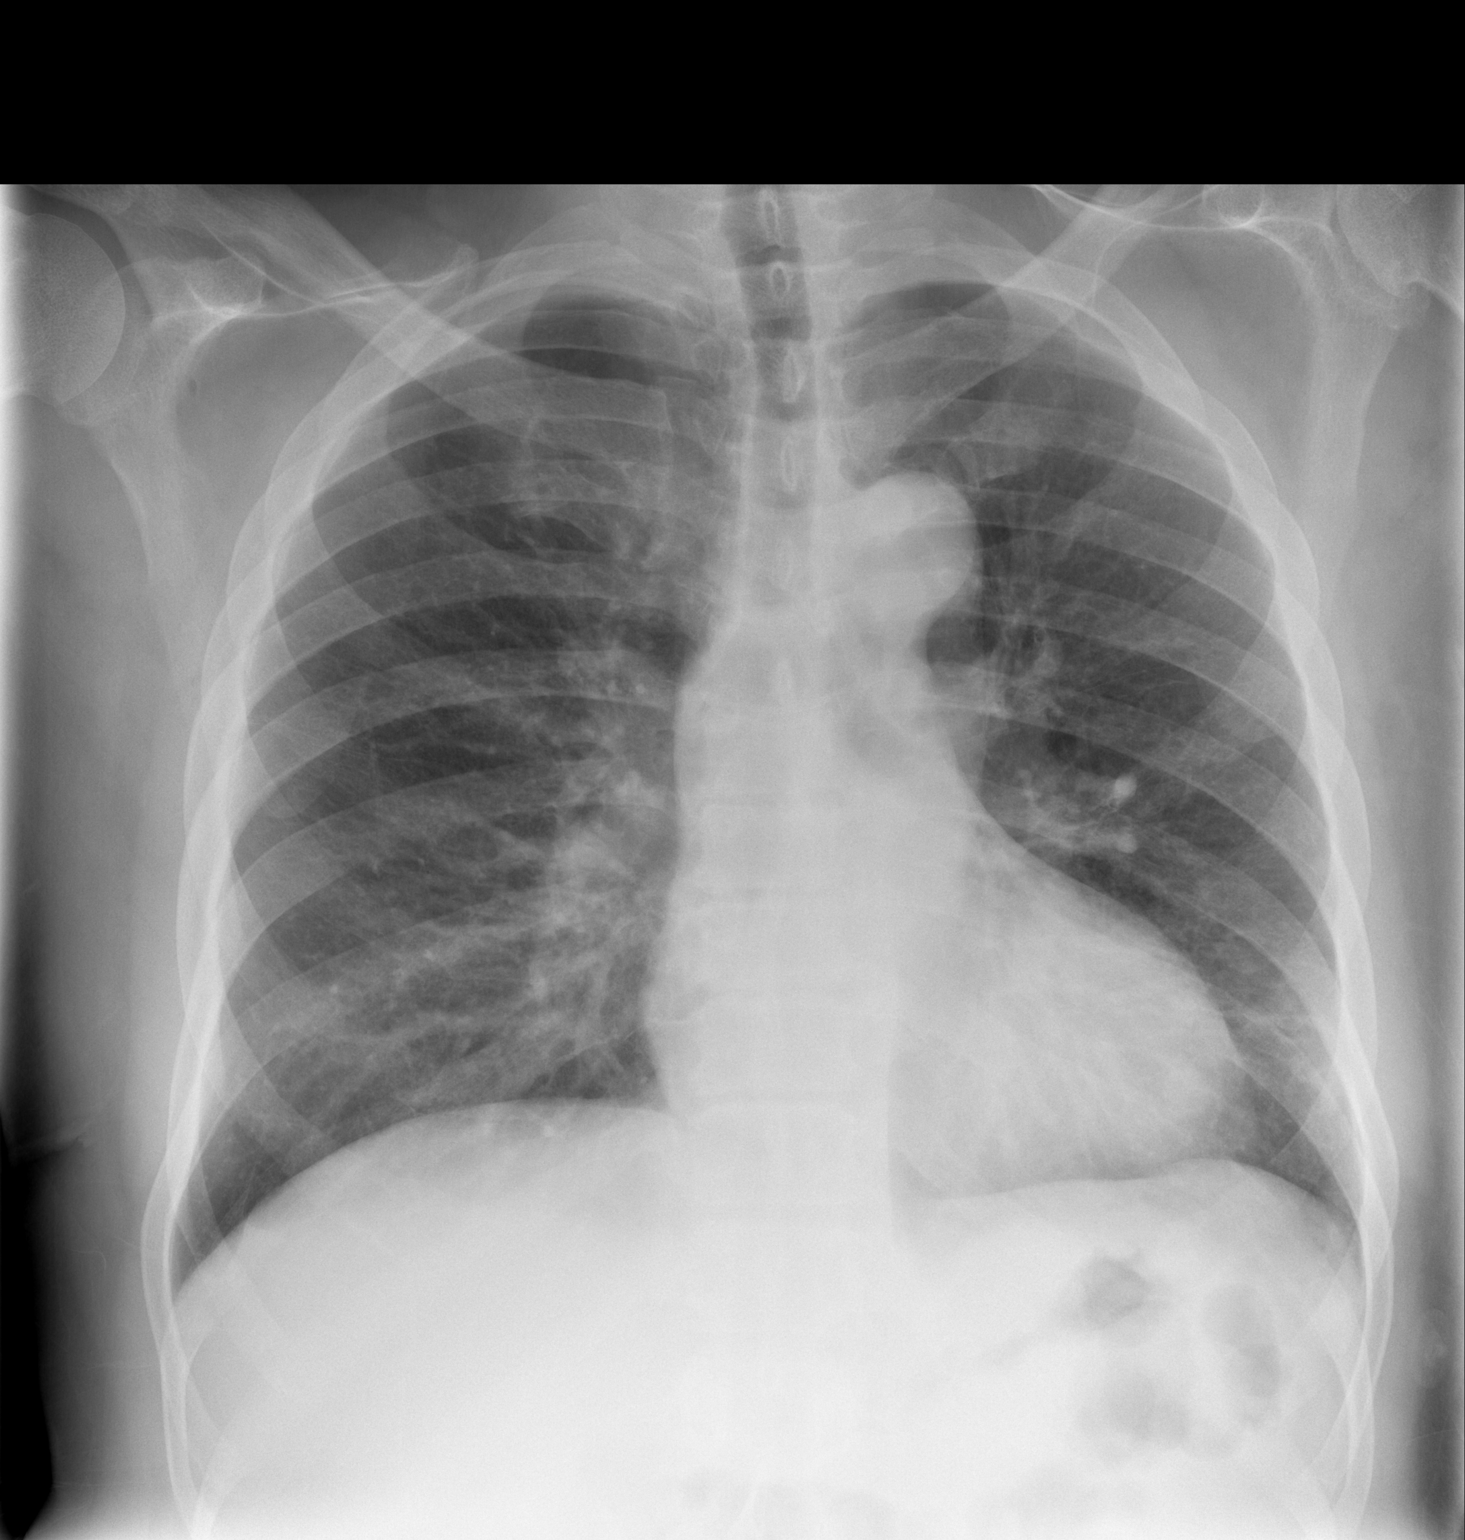

[w chest lat]
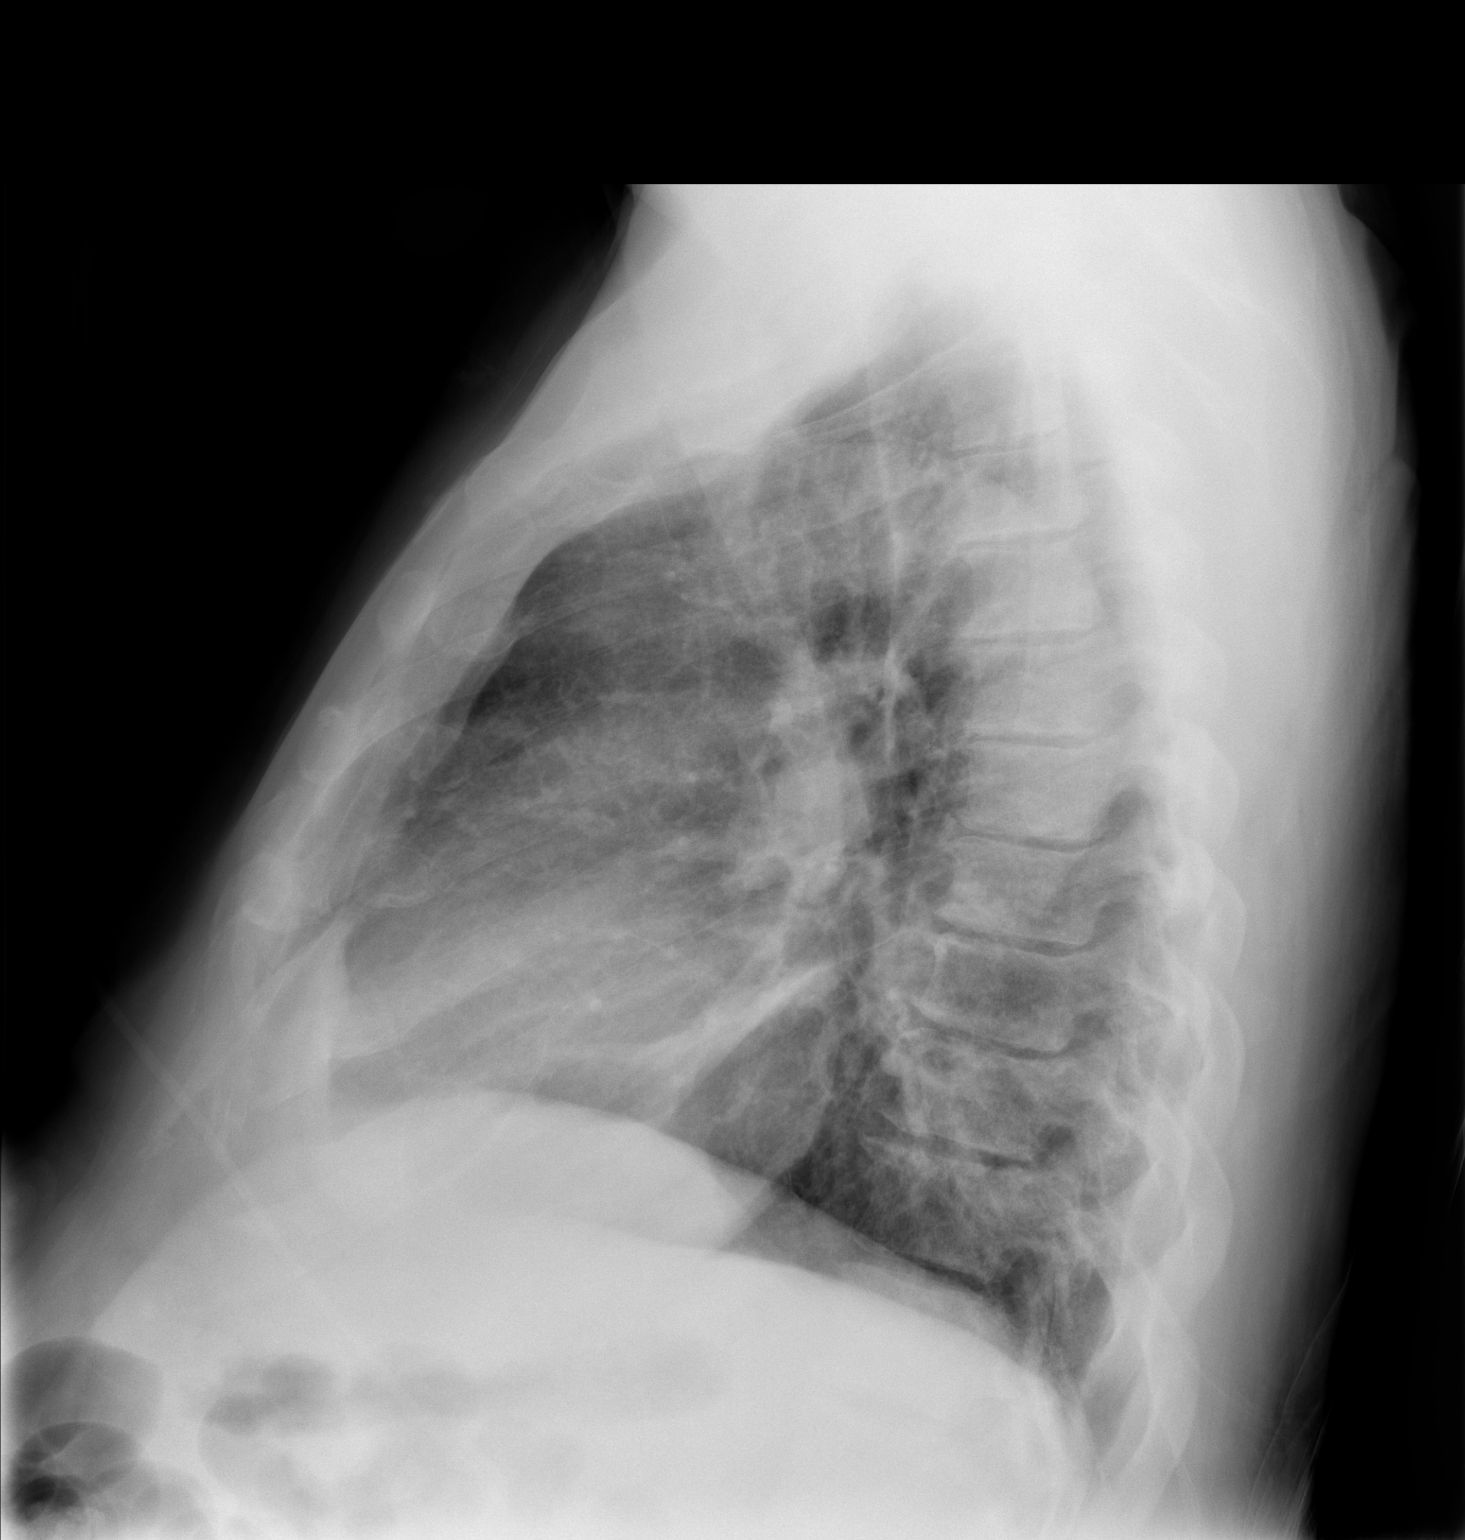

[2 of 2 positions shown; findings below may reference images not displayed]

FINDINGS: There is focal slight atelectasis in the lingula of the
left lung.  Peribronchial thickening is present consistent with
bronchitis.  There are no consolidative infiltrates or effusions.
Heart size and vascularity are normal.  No acute osseous
abnormality.
IMPRESSION: Focal atelectasis in the lingula and bronchitic changes.

## 2012-08-15 DIAGNOSIS — D631 Anemia in chronic kidney disease: Secondary | ICD-10-CM | POA: Diagnosis not present

## 2012-08-15 DIAGNOSIS — N2581 Secondary hyperparathyroidism of renal origin: Secondary | ICD-10-CM | POA: Diagnosis not present

## 2012-08-15 DIAGNOSIS — N186 End stage renal disease: Secondary | ICD-10-CM | POA: Diagnosis not present

## 2012-08-15 DIAGNOSIS — D509 Iron deficiency anemia, unspecified: Secondary | ICD-10-CM | POA: Diagnosis not present

## 2012-08-19 DIAGNOSIS — D631 Anemia in chronic kidney disease: Secondary | ICD-10-CM | POA: Diagnosis not present

## 2012-08-19 DIAGNOSIS — D509 Iron deficiency anemia, unspecified: Secondary | ICD-10-CM | POA: Diagnosis not present

## 2012-08-19 DIAGNOSIS — N186 End stage renal disease: Secondary | ICD-10-CM | POA: Diagnosis not present

## 2012-08-19 DIAGNOSIS — N2581 Secondary hyperparathyroidism of renal origin: Secondary | ICD-10-CM | POA: Diagnosis not present

## 2012-08-20 DIAGNOSIS — N186 End stage renal disease: Secondary | ICD-10-CM | POA: Diagnosis not present

## 2012-08-20 DIAGNOSIS — N039 Chronic nephritic syndrome with unspecified morphologic changes: Secondary | ICD-10-CM | POA: Diagnosis not present

## 2012-08-20 DIAGNOSIS — N2581 Secondary hyperparathyroidism of renal origin: Secondary | ICD-10-CM | POA: Diagnosis not present

## 2012-08-20 DIAGNOSIS — D509 Iron deficiency anemia, unspecified: Secondary | ICD-10-CM | POA: Diagnosis not present

## 2012-08-22 ENCOUNTER — Other Ambulatory Visit: Payer: Self-pay | Admitting: *Deleted

## 2012-08-22 DIAGNOSIS — N186 End stage renal disease: Secondary | ICD-10-CM | POA: Diagnosis not present

## 2012-08-22 DIAGNOSIS — D631 Anemia in chronic kidney disease: Secondary | ICD-10-CM | POA: Diagnosis not present

## 2012-08-22 DIAGNOSIS — N039 Chronic nephritic syndrome with unspecified morphologic changes: Secondary | ICD-10-CM | POA: Diagnosis not present

## 2012-08-22 DIAGNOSIS — T82598A Other mechanical complication of other cardiac and vascular devices and implants, initial encounter: Secondary | ICD-10-CM

## 2012-08-22 DIAGNOSIS — D509 Iron deficiency anemia, unspecified: Secondary | ICD-10-CM | POA: Diagnosis not present

## 2012-08-22 DIAGNOSIS — N2581 Secondary hyperparathyroidism of renal origin: Secondary | ICD-10-CM | POA: Diagnosis not present

## 2012-08-24 ENCOUNTER — Telehealth: Payer: Self-pay | Admitting: *Deleted

## 2012-08-24 ENCOUNTER — Other Ambulatory Visit: Payer: Self-pay | Admitting: Infectious Diseases

## 2012-08-24 ENCOUNTER — Other Ambulatory Visit: Payer: Self-pay | Admitting: *Deleted

## 2012-08-24 DIAGNOSIS — B20 Human immunodeficiency virus [HIV] disease: Secondary | ICD-10-CM

## 2012-08-24 DIAGNOSIS — F419 Anxiety disorder, unspecified: Secondary | ICD-10-CM

## 2012-08-24 DIAGNOSIS — N186 End stage renal disease: Secondary | ICD-10-CM | POA: Diagnosis not present

## 2012-08-24 DIAGNOSIS — D631 Anemia in chronic kidney disease: Secondary | ICD-10-CM | POA: Diagnosis not present

## 2012-08-24 DIAGNOSIS — D509 Iron deficiency anemia, unspecified: Secondary | ICD-10-CM | POA: Diagnosis not present

## 2012-08-24 DIAGNOSIS — N2581 Secondary hyperparathyroidism of renal origin: Secondary | ICD-10-CM | POA: Diagnosis not present

## 2012-08-24 MED ORDER — ALPRAZOLAM 0.5 MG PO TABS: 0.5000 mg | ORAL_TABLET | Freq: Two times a day (BID) | ORAL | Status: DC | PRN

## 2012-08-24 MED ORDER — TENOFOVIR DISOPROXIL FUMARATE 300 MG PO TABS: 300.0000 mg | ORAL_TABLET | ORAL | Status: DC

## 2012-08-24 NOTE — Telephone Encounter (Signed)
First rx called in to the wrong pharmacy.  This rx called to [facility] [address].

## 2012-08-24 NOTE — Telephone Encounter (Signed)
Pt requesting refill for alprazolam.  Last prescription done on 06/30/12 #60 w/ 0 refills.  Will refill.

## 2012-08-25 ENCOUNTER — Other Ambulatory Visit: Payer: Self-pay | Admitting: Licensed Clinical Social Worker

## 2012-08-26 DIAGNOSIS — N039 Chronic nephritic syndrome with unspecified morphologic changes: Secondary | ICD-10-CM | POA: Diagnosis not present

## 2012-08-26 DIAGNOSIS — N2581 Secondary hyperparathyroidism of renal origin: Secondary | ICD-10-CM | POA: Diagnosis not present

## 2012-08-26 DIAGNOSIS — D509 Iron deficiency anemia, unspecified: Secondary | ICD-10-CM | POA: Diagnosis not present

## 2012-08-26 DIAGNOSIS — N186 End stage renal disease: Secondary | ICD-10-CM | POA: Diagnosis not present

## 2012-08-29 ENCOUNTER — Encounter: Payer: Self-pay | Admitting: Vascular Surgery

## 2012-08-29 DIAGNOSIS — D509 Iron deficiency anemia, unspecified: Secondary | ICD-10-CM | POA: Diagnosis not present

## 2012-08-29 DIAGNOSIS — D631 Anemia in chronic kidney disease: Secondary | ICD-10-CM | POA: Diagnosis not present

## 2012-08-29 DIAGNOSIS — N2581 Secondary hyperparathyroidism of renal origin: Secondary | ICD-10-CM | POA: Diagnosis not present

## 2012-08-29 DIAGNOSIS — N186 End stage renal disease: Secondary | ICD-10-CM | POA: Diagnosis not present

## 2012-08-30 ENCOUNTER — Encounter: Payer: Self-pay | Admitting: Vascular Surgery

## 2012-08-30 ENCOUNTER — Encounter (INDEPENDENT_AMBULATORY_CARE_PROVIDER_SITE_OTHER): Payer: Medicare Other | Admitting: *Deleted

## 2012-08-30 ENCOUNTER — Ambulatory Visit (INDEPENDENT_AMBULATORY_CARE_PROVIDER_SITE_OTHER): Payer: Medicare Other | Admitting: Vascular Surgery

## 2012-08-30 VITALS — BP 174/102 | HR 83 | Resp 18 | Ht 72.0 in | Wt 222.3 lb

## 2012-08-30 DIAGNOSIS — Z01810 Encounter for preprocedural cardiovascular examination: Secondary | ICD-10-CM

## 2012-08-30 DIAGNOSIS — T82598A Other mechanical complication of other cardiac and vascular devices and implants, initial encounter: Secondary | ICD-10-CM | POA: Diagnosis not present

## 2012-08-30 DIAGNOSIS — T82898A Other specified complication of vascular prosthetic devices, implants and grafts, initial encounter: Secondary | ICD-10-CM

## 2012-08-30 NOTE — Progress Notes (Signed)
Patient has today for evaluation of his left AV fistula. This was initially placed by Dr. Bridgett Larsson in September of 2012. He said the underwent a shuntogram interventional radiology in April 2013 I have reviewed this as well. He is here today with his brother who is doing the access for home hemodialysis. He is currently using a buttonhole technique with a blunt needle. The patient reports several episodes where he was having difficulty around the time of access in for dialysis. He reports that this is variable and has been less of a problem over the last several weeks.  Past Medical History  Diagnosis Date  . Hypertension   . Gout   . Anemia   . Seizures 06/02/2011  . Thrombocytopenia   . Chronic kidney disease     ESRD requiring hemodialysis  . Anxiety   . Hepatitis B carrier   . HIV infection   . Hemodialysis patient   . Seizure     History  Substance Use Topics  . Smoking status: Current Some Day Smoker    Types: Cigarettes  . Smokeless tobacco: Never Used  . Alcohol Use: No    Family History  Problem Relation Age of Onset  . Hypertension Mother   . Cancer Father     Allergies  Allergen Reactions  . Penicillins Other (See Comments)    Childhood allergy    Current outpatient prescriptions:ALPRAZolam (XANAX) 0.5 MG tablet, Take 1 tablet (0.5 mg total) by mouth 2 (two) times daily as needed for anxiety., Disp: 60 tablet, Rfl: 0;  amLODipine (NORVASC) 10 MG tablet, Take 10 mg by mouth 2 (two) times daily., Disp: , Rfl: ;  Calcium Acetate 667 MG TABS, Take 2 tablets by mouth 3 (three) times daily with meals.  , Disp: , Rfl:  carvedilol (COREG) 3.125 MG tablet, Take 3.125 mg by mouth 2 (two) times daily with a meal., Disp: , Rfl: ;  cinacalcet (SENSIPAR) 30 MG tablet, Take 30 mg by mouth daily., Disp: , Rfl: ;  EPIVIR HBV 5 MG/ML solution, TAKE 50MG  (10ML) BY MOUTH ON DAY 1 , THEN TAKE 25MG  (5ML) EVERY DAY AFTERWARDS, Disp: 240 mL, Rfl: 4;  lamivudine (EPIVIR-HBV) 5 MG/ML solution,  Take 25 mg by mouth daily., Disp: , Rfl:  lidocaine (XYLOCAINE) 5 % ointment, Apply 1 application topically daily.  , Disp: , Rfl: ;  multivitamin (RENA-VIT) TABS tablet, Take 1 tablet by mouth daily.  , Disp: , Rfl: ;  raltegravir (ISENTRESS) 400 MG tablet, Take 1 tablet (400 mg total) by mouth 2 (two) times daily., Disp: 60 tablet, Rfl: 11;  tenofovir (VIREAD) 300 MG tablet, Take 1 tablet (300 mg total) by mouth once a week. After dialysis., Disp: 5 tablet, Rfl: 11 albuterol (PROVENTIL HFA;VENTOLIN HFA) 108 (90 BASE) MCG/ACT inhaler, Inhale 2 puffs into the lungs every 4 (four) hours as needed for wheezing or shortness of breath., Disp: 1 Inhaler, Rfl: 3  BP 174/102  Pulse 83  Resp 18  Ht 6' (1.829 m)  Wt 222 lb 4.8 oz (100.835 kg)  BMI 30.15 kg/m2  Body mass index is 30.15 kg/(m^2).       Physical exam he does have a good thrill throughout this. He does not have any evidence of infection and does have a palpable radial pulse with a well-perfused hand. There is no skin breakdown at the buttonhole sites. He does not have flow through the upper arm cephalic vein by physical exam and this is confirmed by both ultrasound and his  shuntogram. This does show outflow into the basilic vein above the elbow.  Noninvasive vascular studies her office today reveal patency of his fistula. There were several areas of increased velocities at near the arterial anastomosis and in several areas in his forearm.  Impression and plan functioning left wrist Cimino fistula with pain in the past which is now improved. I do not see any evidence of infection or other issues that would cause difficulty with his graft. He is having good flow and I would not recommend any intervention on the areas of narrowing since the graft appears to be working adequately. He understands this and will see [name] on an as-needed basis should he develop recurrent pain or difficulty with access

## 2012-08-31 ENCOUNTER — Telehealth: Payer: Self-pay | Admitting: *Deleted

## 2012-08-31 ENCOUNTER — Ambulatory Visit (INDEPENDENT_AMBULATORY_CARE_PROVIDER_SITE_OTHER): Payer: Medicare Other | Admitting: Infectious Diseases

## 2012-08-31 VITALS — BP 192/104 | HR 94 | Temp 98.3°F | Ht 72.0 in | Wt 220.0 lb

## 2012-08-31 DIAGNOSIS — I10 Essential (primary) hypertension: Secondary | ICD-10-CM | POA: Diagnosis not present

## 2012-08-31 DIAGNOSIS — F411 Generalized anxiety disorder: Secondary | ICD-10-CM

## 2012-08-31 DIAGNOSIS — N186 End stage renal disease: Secondary | ICD-10-CM | POA: Diagnosis not present

## 2012-08-31 DIAGNOSIS — F419 Anxiety disorder, unspecified: Secondary | ICD-10-CM

## 2012-08-31 DIAGNOSIS — Z23 Encounter for immunization: Secondary | ICD-10-CM | POA: Diagnosis not present

## 2012-08-31 DIAGNOSIS — D509 Iron deficiency anemia, unspecified: Secondary | ICD-10-CM | POA: Diagnosis not present

## 2012-08-31 DIAGNOSIS — N039 Chronic nephritic syndrome with unspecified morphologic changes: Secondary | ICD-10-CM | POA: Diagnosis not present

## 2012-08-31 DIAGNOSIS — B20 Human immunodeficiency virus [HIV] disease: Secondary | ICD-10-CM

## 2012-08-31 DIAGNOSIS — N2581 Secondary hyperparathyroidism of renal origin: Secondary | ICD-10-CM | POA: Diagnosis not present

## 2012-08-31 MED ORDER — CLONIDINE HCL 0.1 MG PO TABS: 0.2000 mg | ORAL_TABLET | Freq: Once | ORAL | Status: DC

## 2012-08-31 MED ORDER — CLONIDINE HCL 0.1 MG PO TABS
0.2000 mg | ORAL_TABLET | Freq: Once | ORAL | Status: AC
  Administered 2012-08-31: 0.2 mg via ORAL

## 2012-08-31 MED ORDER — TEMAZEPAM 22.5 MG PO CAPS: 22.5000 mg | ORAL_CAPSULE | Freq: Every evening | ORAL | Status: DC | PRN

## 2012-08-31 MED ORDER — CARVEDILOL 12.5 MG PO TABS: 12.5000 mg | ORAL_TABLET | Freq: Two times a day (BID) | ORAL | Status: DC

## 2012-08-31 MED ORDER — CLONIDINE HCL 0.2 MG PO TABS: 0.2000 mg | ORAL_TABLET | Freq: Two times a day (BID) | ORAL | Status: DC

## 2012-08-31 NOTE — Assessment & Plan Note (Signed)
Did not like xanax, was taking mostly at HS. Will give him trial of restoril- no HD adjustment needed.

## 2012-08-31 NOTE — Assessment & Plan Note (Signed)
Spoke with nephrology, will start Coreg 12.5 bid. Greatly appreciate their help. He will continue to monitor his BP at home.

## 2012-08-31 NOTE — Addendum Note (Signed)
Addended by: Landis Gandy on: 08/31/2012 03:49 PM   Modules accepted: Orders

## 2012-08-31 NOTE — Addendum Note (Signed)
Addended by: Makaylia Hewett C on: 08/31/2012 03:40 PM   Modules accepted: Orders

## 2012-08-31 NOTE — Progress Notes (Signed)
  Subjective:    Patient ID: Jeffrey [name], male    DOB: January 13, 1956, 56 y.o.   MRN: [id number]  HPI 55 yo M with HIV+, GERD, ESRD, and admission December 2012 with Influenza and his HD catheter was infected. Currently on TFV/3TC/ISN. Has been doing HD at home.  Has been having trouble with BP lately. Has been started on amlodipine bid by nephrology 2 weeks ago. His home #s are [id number] systolic.  Has been having pressure/pressing headaches on his occiput.   HIV 1 RNA Quant (copies/mL)  Date Value  05/31/2012 <20   11/25/2011 <20   08/26/2011 <20      CD4 T Cell Abs (cmm)  Date Value  05/31/2012 290*  11/25/2011 320*  08/26/2011 400      Review of Systems  Constitutional: Negative for appetite change and unexpected weight change.  Respiratory: Negative for shortness of breath.   Cardiovascular: Negative for chest pain and leg swelling.  Genitourinary: Negative for difficulty urinating.  Neurological: Positive for headaches.       Objective:   Physical Exam        Assessment & Plan:

## 2012-08-31 NOTE — Telephone Encounter (Signed)
Patient called and advised that he is a dialysis patient and his BP has been running high for about a month despite being dialyzed. He advised that even after he has done his dialysis and taken his Bp meds it is running about 160/98 sometimes higher, sometimes lower but he has not had a normal BP in over a month and is concerned that his medication needs to be changed or increased. He does not have a follow up appt with Dr Johnnye Sima, who the patient says is his PCP until February 2014. He does not feel comfortable waiting until then. He reports no other symptoms, just the elevated BP. After review of his charted BP they have been elevated since September 2013. His provider has available appts and offered the patient an appt today 08/31/12 at 230 pm and he accepted.

## 2012-08-31 NOTE — Assessment & Plan Note (Addendum)
He appears to be doing well. Will cont inue to watch his counts. I spoke with him about renal txp, agree with Dr Justin Mend- at his current CD4 immune suppression would not be reasonable. If he had higher counts, would consider? Gets flu shot today.

## 2012-09-01 ENCOUNTER — Telehealth: Payer: Self-pay | Admitting: *Deleted

## 2012-09-01 NOTE — Telephone Encounter (Signed)
Restoril not covered under patient's insurance, cost is $200.00. Could something else be prescribed, trazodone may be covered, please advise. Myrtis Hopping CMA

## 2012-09-02 ENCOUNTER — Other Ambulatory Visit: Payer: Self-pay | Admitting: *Deleted

## 2012-09-02 ENCOUNTER — Other Ambulatory Visit: Payer: Self-pay | Admitting: Infectious Diseases

## 2012-09-02 ENCOUNTER — Telehealth: Payer: Self-pay | Admitting: *Deleted

## 2012-09-02 DIAGNOSIS — N039 Chronic nephritic syndrome with unspecified morphologic changes: Secondary | ICD-10-CM | POA: Diagnosis not present

## 2012-09-02 DIAGNOSIS — G4700 Insomnia, unspecified: Secondary | ICD-10-CM

## 2012-09-02 DIAGNOSIS — N2581 Secondary hyperparathyroidism of renal origin: Secondary | ICD-10-CM | POA: Diagnosis not present

## 2012-09-02 DIAGNOSIS — N186 End stage renal disease: Secondary | ICD-10-CM | POA: Diagnosis not present

## 2012-09-02 DIAGNOSIS — F419 Anxiety disorder, unspecified: Secondary | ICD-10-CM

## 2012-09-02 DIAGNOSIS — D509 Iron deficiency anemia, unspecified: Secondary | ICD-10-CM | POA: Diagnosis not present

## 2012-09-02 MED ORDER — TRAZODONE 25 MG HALF TABLET: 25.0000 mg | ORAL_TABLET | Freq: Every evening | ORAL | Status: DC | PRN

## 2012-09-02 MED ORDER — TRAZODONE HCL 50 MG PO TABS: 25.0000 mg | ORAL_TABLET | Freq: Every day | ORAL | Status: DC

## 2012-09-02 NOTE — Telephone Encounter (Signed)
Wrote rx for trazodone 25mg  qhs prn.  thanks

## 2012-09-02 NOTE — Telephone Encounter (Signed)
thanks

## 2012-09-02 NOTE — Telephone Encounter (Signed)
Patient notified Myrtis Hopping CMA

## 2012-09-02 NOTE — Telephone Encounter (Signed)
Pharmacist from [facility] called regarding dosing on Trazodone.  Does not come in 25 mg, and was sent as 1/2 of 25 to total 25. Changed to 1/2 50 to total 25 mg. Myrtis Hopping CMA

## 2012-09-05 DIAGNOSIS — N2581 Secondary hyperparathyroidism of renal origin: Secondary | ICD-10-CM | POA: Diagnosis not present

## 2012-09-05 DIAGNOSIS — N039 Chronic nephritic syndrome with unspecified morphologic changes: Secondary | ICD-10-CM | POA: Diagnosis not present

## 2012-09-05 DIAGNOSIS — N186 End stage renal disease: Secondary | ICD-10-CM | POA: Diagnosis not present

## 2012-09-05 DIAGNOSIS — D509 Iron deficiency anemia, unspecified: Secondary | ICD-10-CM | POA: Diagnosis not present

## 2012-09-07 DIAGNOSIS — D631 Anemia in chronic kidney disease: Secondary | ICD-10-CM | POA: Diagnosis not present

## 2012-09-07 DIAGNOSIS — D509 Iron deficiency anemia, unspecified: Secondary | ICD-10-CM | POA: Diagnosis not present

## 2012-09-07 DIAGNOSIS — N2581 Secondary hyperparathyroidism of renal origin: Secondary | ICD-10-CM | POA: Diagnosis not present

## 2012-09-07 DIAGNOSIS — N186 End stage renal disease: Secondary | ICD-10-CM | POA: Diagnosis not present

## 2012-09-09 DIAGNOSIS — N186 End stage renal disease: Secondary | ICD-10-CM | POA: Diagnosis not present

## 2012-09-09 DIAGNOSIS — D631 Anemia in chronic kidney disease: Secondary | ICD-10-CM | POA: Diagnosis not present

## 2012-09-09 DIAGNOSIS — N2581 Secondary hyperparathyroidism of renal origin: Secondary | ICD-10-CM | POA: Diagnosis not present

## 2012-09-09 DIAGNOSIS — D509 Iron deficiency anemia, unspecified: Secondary | ICD-10-CM | POA: Diagnosis not present

## 2012-09-12 DIAGNOSIS — D631 Anemia in chronic kidney disease: Secondary | ICD-10-CM | POA: Diagnosis not present

## 2012-09-12 DIAGNOSIS — N2581 Secondary hyperparathyroidism of renal origin: Secondary | ICD-10-CM | POA: Diagnosis not present

## 2012-09-12 DIAGNOSIS — D509 Iron deficiency anemia, unspecified: Secondary | ICD-10-CM | POA: Diagnosis not present

## 2012-09-12 DIAGNOSIS — N186 End stage renal disease: Secondary | ICD-10-CM | POA: Diagnosis not present

## 2012-09-14 DIAGNOSIS — N2581 Secondary hyperparathyroidism of renal origin: Secondary | ICD-10-CM | POA: Diagnosis not present

## 2012-09-14 DIAGNOSIS — D509 Iron deficiency anemia, unspecified: Secondary | ICD-10-CM | POA: Diagnosis not present

## 2012-09-14 DIAGNOSIS — N186 End stage renal disease: Secondary | ICD-10-CM | POA: Diagnosis not present

## 2012-09-14 DIAGNOSIS — N039 Chronic nephritic syndrome with unspecified morphologic changes: Secondary | ICD-10-CM | POA: Diagnosis not present

## 2012-09-16 DIAGNOSIS — N039 Chronic nephritic syndrome with unspecified morphologic changes: Secondary | ICD-10-CM | POA: Diagnosis not present

## 2012-09-16 DIAGNOSIS — D509 Iron deficiency anemia, unspecified: Secondary | ICD-10-CM | POA: Diagnosis not present

## 2012-09-16 DIAGNOSIS — N2581 Secondary hyperparathyroidism of renal origin: Secondary | ICD-10-CM | POA: Diagnosis not present

## 2012-09-16 DIAGNOSIS — N186 End stage renal disease: Secondary | ICD-10-CM | POA: Diagnosis not present

## 2012-09-17 ENCOUNTER — Encounter (HOSPITAL_COMMUNITY): Payer: Self-pay

## 2012-09-17 ENCOUNTER — Emergency Department (HOSPITAL_COMMUNITY)
Admission: EM | Admit: 2012-09-17 | Discharge: 2012-09-17 | Disposition: A | Discharge status: Home / Self Care | Payer: Medicare Other | Attending: Emergency Medicine | Admitting: Emergency Medicine

## 2012-09-17 ENCOUNTER — Emergency Department (HOSPITAL_COMMUNITY): Payer: Medicare Other

## 2012-09-17 DIAGNOSIS — Z79899 Other long term (current) drug therapy: Secondary | ICD-10-CM | POA: Insufficient documentation

## 2012-09-17 DIAGNOSIS — N186 End stage renal disease: Secondary | ICD-10-CM | POA: Diagnosis not present

## 2012-09-17 DIAGNOSIS — Z8639 Personal history of other endocrine, nutritional and metabolic disease: Secondary | ICD-10-CM | POA: Insufficient documentation

## 2012-09-17 DIAGNOSIS — I10 Essential (primary) hypertension: Secondary | ICD-10-CM

## 2012-09-17 DIAGNOSIS — B20 Human immunodeficiency virus [HIV] disease: Secondary | ICD-10-CM | POA: Diagnosis not present

## 2012-09-17 DIAGNOSIS — Z862 Personal history of diseases of the blood and blood-forming organs and certain disorders involving the immune mechanism: Secondary | ICD-10-CM | POA: Insufficient documentation

## 2012-09-17 DIAGNOSIS — Z992 Dependence on renal dialysis: Secondary | ICD-10-CM | POA: Diagnosis not present

## 2012-09-17 DIAGNOSIS — F411 Generalized anxiety disorder: Secondary | ICD-10-CM | POA: Insufficient documentation

## 2012-09-17 DIAGNOSIS — B181 Chronic viral hepatitis B without delta-agent: Secondary | ICD-10-CM | POA: Insufficient documentation

## 2012-09-17 DIAGNOSIS — Z8669 Personal history of other diseases of the nervous system and sense organs: Secondary | ICD-10-CM | POA: Insufficient documentation

## 2012-09-17 DIAGNOSIS — R51 Headache: Secondary | ICD-10-CM | POA: Diagnosis not present

## 2012-09-17 DIAGNOSIS — K625 Hemorrhage of anus and rectum: Secondary | ICD-10-CM | POA: Insufficient documentation

## 2012-09-17 DIAGNOSIS — I120 Hypertensive chronic kidney disease with stage 5 chronic kidney disease or end stage renal disease: Secondary | ICD-10-CM | POA: Diagnosis not present

## 2012-09-17 DIAGNOSIS — F17200 Nicotine dependence, unspecified, uncomplicated: Secondary | ICD-10-CM | POA: Diagnosis not present

## 2012-09-17 DIAGNOSIS — R6889 Other general symptoms and signs: Secondary | ICD-10-CM | POA: Diagnosis not present

## 2012-09-17 LAB — CBC WITH DIFFERENTIAL/PLATELET
Basophils Absolute: 0 10*3/uL (ref 0.0–0.1)
Basophils Relative: 0 % (ref 0–1)
Eosinophils Absolute: 0.2 10*3/uL (ref 0.0–0.7)
Eosinophils Relative: 2 % (ref 0–5)
HCT: 34.8 % — ABNORMAL LOW (ref 39.0–52.0)
MCH: 27.9 pg (ref 26.0–34.0)
MCHC: 32.8 g/dL (ref 30.0–36.0)
MCV: 85.1 fL (ref 78.0–100.0)
Monocytes Absolute: 0.4 10*3/uL (ref 0.1–1.0)
RDW: 18.7 % — ABNORMAL HIGH (ref 11.5–15.5)

## 2012-09-17 LAB — COMPREHENSIVE METABOLIC PANEL
AST: 20 U/L (ref 0–37)
Albumin: 4.2 g/dL (ref 3.5–5.2)
Calcium: 9.7 mg/dL (ref 8.4–10.5)
Creatinine, Ser: 16.35 mg/dL — ABNORMAL HIGH (ref 0.50–1.35)

## 2012-09-17 LAB — PRO B NATRIURETIC PEPTIDE: Pro B Natriuretic peptide (BNP): 11587 pg/mL — ABNORMAL HIGH (ref 0–125)

## 2012-09-17 LAB — TROPONIN I: Troponin I: <0.3 ng/mL (ref <0.30)

## 2012-09-17 MED ORDER — LABETALOL HCL 5 MG/ML IV SOLN
20.0000 mg | Freq: Once | INTRAVENOUS | Status: AC
  Administered 2012-09-17: 20 mg via INTRAVENOUS
  Filled 2012-09-17: qty 4

## 2012-09-17 MED ORDER — HYDRALAZINE HCL 20 MG/ML IJ SOLN
10.0000 mg | Freq: Once | INTRAMUSCULAR | Status: AC
  Administered 2012-09-17: 10 mg via INTRAVENOUS
  Filled 2012-09-17: qty 1

## 2012-09-17 NOTE — ED Provider Notes (Signed)
History     CSN: [id number]  Arrival date & time 09/17/12  1053   First MD Initiated Contact with Patient 09/17/12 1100      Chief Complaint  Patient presents with  . Hypertension    (Consider location/radiation/quality/duration/timing/severity/associated sxs/prior treatment) HPI Comments: Patient brought to the emergency department from dialysis by ambulance. Patient was brought to the ER for evaluation of elevated blood pressure. Patient's blood pressure was running very high while at dialysis. Family member requested that the dialysis session be terminated and the patient be brought to the ER. Patient reports that he has a headache. He denies chest pain and shortness of breath. Blood pressure has been 220s over 120s during transport by EMS. Patient did not take his medications this morning.  Patient is a 56 y.o. male presenting with hypertension.  Hypertension Pertinent negatives include no chest pain and no shortness of breath.    Past Medical History  Diagnosis Date  . Hypertension   . Gout   . Anemia   . Seizures 06/02/2011  . Thrombocytopenia   . Chronic kidney disease     ESRD requiring hemodialysis  . Anxiety   . Hepatitis B carrier   . HIV infection   . Hemodialysis patient   . Seizure     Past Surgical History  Procedure Date  . Av fistula placement 06/02/11    Left radiocephalic AVF    Family History  Problem Relation Age of Onset  . Hypertension Mother   . Cancer Father     History  Substance Use Topics  . Smoking status: Current Some Day Smoker    Types: Cigarettes  . Smokeless tobacco: Never Used  . Alcohol Use: No      Review of Systems  Constitutional: Negative for fever.  Respiratory: Negative for shortness of breath.   Cardiovascular: Negative for chest pain.  Gastrointestinal: Positive for anal bleeding. Negative for abdominal distention.  All other systems reviewed and are negative.    Allergies  Lisinopril and  Penicillins  Home Medications   Current Outpatient Rx  Name  Route  Sig  Dispense  Refill  . ALBUTEROL SULFATE HFA 108 (90 BASE) MCG/ACT IN AERS   Inhalation   Inhale 2 puffs into the lungs every 4 (four) hours as needed for wheezing or shortness of breath.   1 Inhaler   3   . AMLODIPINE BESYLATE 10 MG PO TABS   Oral   Take 10 mg by mouth 2 (two) times daily.         Marland Kitchen CALCIUM ACETATE 667 MG PO TABS   Oral   Take 2 tablets by mouth 3 (three) times daily with meals.           Marland Kitchen CARVEDILOL 12.5 MG PO TABS   Oral   Take 1 tablet (12.5 mg total) by mouth 2 (two) times daily with a meal.   60 tablet   3   . CINACALCET HCL 30 MG PO TABS   Oral   Take 30 mg by mouth daily.         Marland Kitchen EPIVIR HBV 5 MG/ML PO SOLN      TAKE 50MG  (10ML) BY MOUTH ON DAY 1 , THEN TAKE 25MG  (5ML) EVERY DAY AFTERWARDS   240 mL   4   . LIDOCAINE 5 % EX OINT   Topical   Apply 1 application topically daily.           Marland Kitchen RENA-VITE PO TABS  Oral   Take 1 tablet by mouth daily.           Marland Kitchen RALTEGRAVIR POTASSIUM 400 MG PO TABS   Oral   Take 1 tablet (400 mg total) by mouth 2 (two) times daily.   60 tablet   11   . TENOFOVIR DISOPROXIL FUMARATE 300 MG PO TABS   Oral   Take 1 tablet (300 mg total) by mouth once a week. After dialysis.   5 tablet   11   . TRAZODONE HCL 50 MG PO TABS   Oral   Take 0.5 tablets (25 mg total) by mouth at bedtime.   30 tablet   1     There were no vitals taken for this visit.  Physical Exam  Constitutional: He is oriented to person, place, and time. He appears well-developed and well-nourished. No distress.  HENT:  Head: Normocephalic and atraumatic.  Right Ear: Hearing normal.  Nose: Nose normal.  Mouth/Throat: Oropharynx is clear and moist and mucous membranes are normal.  Eyes: Conjunctivae normal and EOM are normal. Pupils are equal, round, and reactive to light.  Neck: Normal range of motion. Neck supple.  Cardiovascular: Normal rate, regular  rhythm, S1 normal and S2 normal.  Exam reveals no gallop and no friction rub.   No murmur heard. Pulmonary/Chest: Effort normal and breath sounds normal. No respiratory distress. He exhibits no tenderness.  Abdominal: Soft. Normal appearance and bowel sounds are normal. There is no hepatosplenomegaly. There is no tenderness. There is no rebound, no guarding, no tenderness at McBurney's point and negative Murphy's sign. No hernia.  Genitourinary: Rectal exam shows external hemorrhoid. Rectal exam shows no mass, no tenderness and anal tone normal.  Musculoskeletal: Normal range of motion.  Neurological: He is alert and oriented to person, place, and time. He has normal strength. No cranial nerve deficit or sensory deficit. Coordination normal. GCS eye subscore is 4. GCS verbal subscore is 5. GCS motor subscore is 6.  Skin: Skin is warm, dry and intact. No rash noted. No cyanosis.  Psychiatric: He has a normal mood and affect. His speech is normal and behavior is normal. Thought content normal.    ED Course  Procedures (including critical care time)   Date: 09/17/2012  Rate: 92  Rhythm: normal sinus rhythm  QRS Axis: normal  Intervals: normal  ST/T Wave abnormalities: normal  Conduction Disutrbances:first-degree A-V block   Narrative Interpretation:   Old EKG Reviewed: unchanged    Labs Reviewed  CBC WITH DIFFERENTIAL - Abnormal; Notable for the following:    RBC 4.09 (*)     Hemoglobin 11.4 (*)     HCT 34.8 (*)     RDW 18.7 (*)     Platelets 93 (*)  CONSISTENT WITH PREVIOUS RESULT   All other components within normal limits  COMPREHENSIVE METABOLIC PANEL - Abnormal; Notable for the following:    Sodium 134 (*)     Chloride 91 (*)     Glucose, Bld 109 (*)     BUN 76 (*)     Creatinine, Ser 16.35 (*)     Total Protein 9.6 (*)     GFR calc non Af Amer 3 (*)     GFR calc Af Amer 3 (*)     All other components within normal limits  PRO B NATRIURETIC PEPTIDE - Abnormal; Notable  for the following:    Pro B Natriuretic peptide (BNP) 11587.0 (*)     All other components within normal  limits  TROPONIN I   Ct Head Wo Contrast  09/17/2012  *RADIOLOGY REPORT*  Clinical Data: Headache and hypertension.  HIV positive.  History of seizures.  Hepatitis B.  CT HEAD WITHOUT CONTRAST  Technique:  Contiguous axial images were obtained from the base of the skull through the vertex without contrast.  Comparison: 06/02/2011.  Findings: Bone windows demonstrate hypoplastic frontal sinuses. Clear mastoid air cells.  Soft tissue windows demonstrate no  mass lesion, hemorrhage, hydrocephalus, acute infarct, intra-axial, or extra-axial fluid collection.  IMPRESSION: No acute intracranial abnormality.   Original Report Authenticated By: Abigail Miyamoto, M.D.      No diagnosis found.    MDM  Patient presented with hypertensive urgency. He does have a history of chronic hypertension with renal disease. Patient performs home dialysis. He has missed a couple of treatments this week because of difficulty with equipment. He had this problem fixed today and did be part of the treatment, but on the blood pressure was staying high, he was brought to the ER for further evaluation. He did have headache at arrival, but neurologic examination was unremarkable. Patient reports that he has had some tremors and twitching in the hands for last couple of days. This might be secondary to mild uremia, but his electrolytes are within normal limits.  Patient was treated with IV labetalol which only had moderate improvement. He was then given IV hydralazine and blood pressure was 150/75. Family reports that is about his baseline. He is feeling much better. Headache is resolved. He likely does need further dialysis, but will not require hospitalization at this time as he has access to home dialysis. He'll be discharged to home, take his current medications as previously prescribed and finish his dialysis treatment. Return  to ER if symptoms worsen.        Orpah Greek, MD 09/18/12 [phone]

## 2012-09-17 NOTE — ED Notes (Signed)
Pt here by ems, per ems pt was on home HD treatment and started having headache and HTN, reported to be over 200, pt is HIV possitive. Per brother pt has slurred speech, with ems pt is alert and oriented and negative stroke scale. 18 g on right ac and dialysis fistula in left arm.

## 2012-09-19 ENCOUNTER — Emergency Department (INDEPENDENT_AMBULATORY_CARE_PROVIDER_SITE_OTHER)
Admission: EM | Admit: 2012-09-19 | Discharge: 2012-09-19 | Disposition: A | Discharge status: Nursing Home (Includes ICF) | Payer: Medicare Other | Source: Home / Self Care | Attending: Family Medicine | Admitting: Family Medicine

## 2012-09-19 ENCOUNTER — Emergency Department (HOSPITAL_COMMUNITY)
Admission: EM | Admit: 2012-09-19 | Discharge: 2012-09-19 | Disposition: A | Discharge status: Home / Self Care | Payer: Medicare Other | Attending: Emergency Medicine | Admitting: Emergency Medicine

## 2012-09-19 ENCOUNTER — Telehealth: Payer: Self-pay | Admitting: *Deleted

## 2012-09-19 ENCOUNTER — Encounter (HOSPITAL_COMMUNITY): Payer: Self-pay | Admitting: Cardiology

## 2012-09-19 ENCOUNTER — Encounter (HOSPITAL_COMMUNITY): Payer: Self-pay | Admitting: Emergency Medicine

## 2012-09-19 DIAGNOSIS — Z992 Dependence on renal dialysis: Secondary | ICD-10-CM | POA: Insufficient documentation

## 2012-09-19 DIAGNOSIS — R51 Headache: Secondary | ICD-10-CM | POA: Insufficient documentation

## 2012-09-19 DIAGNOSIS — Z862 Personal history of diseases of the blood and blood-forming organs and certain disorders involving the immune mechanism: Secondary | ICD-10-CM | POA: Diagnosis not present

## 2012-09-19 DIAGNOSIS — M109 Gout, unspecified: Secondary | ICD-10-CM | POA: Insufficient documentation

## 2012-09-19 DIAGNOSIS — Z8659 Personal history of other mental and behavioral disorders: Secondary | ICD-10-CM | POA: Insufficient documentation

## 2012-09-19 DIAGNOSIS — N186 End stage renal disease: Secondary | ICD-10-CM | POA: Insufficient documentation

## 2012-09-19 DIAGNOSIS — I10 Essential (primary) hypertension: Secondary | ICD-10-CM

## 2012-09-19 DIAGNOSIS — N2581 Secondary hyperparathyroidism of renal origin: Secondary | ICD-10-CM | POA: Diagnosis not present

## 2012-09-19 DIAGNOSIS — B1920 Unspecified viral hepatitis C without hepatic coma: Secondary | ICD-10-CM | POA: Diagnosis not present

## 2012-09-19 DIAGNOSIS — F17200 Nicotine dependence, unspecified, uncomplicated: Secondary | ICD-10-CM | POA: Diagnosis not present

## 2012-09-19 DIAGNOSIS — Z79899 Other long term (current) drug therapy: Secondary | ICD-10-CM | POA: Diagnosis not present

## 2012-09-19 DIAGNOSIS — Z8669 Personal history of other diseases of the nervous system and sense organs: Secondary | ICD-10-CM | POA: Insufficient documentation

## 2012-09-19 DIAGNOSIS — I129 Hypertensive chronic kidney disease with stage 1 through stage 4 chronic kidney disease, or unspecified chronic kidney disease: Secondary | ICD-10-CM

## 2012-09-19 DIAGNOSIS — D631 Anemia in chronic kidney disease: Secondary | ICD-10-CM | POA: Diagnosis not present

## 2012-09-19 DIAGNOSIS — Z21 Asymptomatic human immunodeficiency virus [HIV] infection status: Secondary | ICD-10-CM | POA: Insufficient documentation

## 2012-09-19 DIAGNOSIS — I120 Hypertensive chronic kidney disease with stage 5 chronic kidney disease or end stage renal disease: Secondary | ICD-10-CM | POA: Diagnosis not present

## 2012-09-19 DIAGNOSIS — D696 Thrombocytopenia, unspecified: Secondary | ICD-10-CM | POA: Diagnosis not present

## 2012-09-19 DIAGNOSIS — D509 Iron deficiency anemia, unspecified: Secondary | ICD-10-CM | POA: Diagnosis not present

## 2012-09-19 LAB — POCT I-STAT, CHEM 8
Chloride: 93 mEq/L — ABNORMAL LOW (ref 96–112)
Creatinine, Ser: 8 mg/dL — ABNORMAL HIGH (ref 0.50–1.35)
Glucose, Bld: 104 mg/dL — ABNORMAL HIGH (ref 70–99)
HCT: 40 % (ref 39.0–52.0)
Potassium: 3.4 mEq/L — ABNORMAL LOW (ref 3.5–5.1)
Sodium: 136 mEq/L (ref 135–145)

## 2012-09-19 LAB — CBC
HCT: 37.2 % — ABNORMAL LOW (ref 39.0–52.0)
Hemoglobin: 12.2 g/dL — ABNORMAL LOW (ref 13.0–17.0)
RDW: 18.4 % — ABNORMAL HIGH (ref 11.5–15.5)
WBC: 4.6 10*3/uL (ref 4.0–10.5)

## 2012-09-19 MED ORDER — ONDANSETRON 4 MG PO TBDP
ORAL_TABLET | ORAL | Status: AC
  Filled 2012-09-19: qty 2

## 2012-09-19 MED ORDER — ONDANSETRON 4 MG PO TBDP
8.0000 mg | ORAL_TABLET | Freq: Once | ORAL | Status: AC
  Administered 2012-09-19: 8 mg via ORAL

## 2012-09-19 MED ORDER — HYDRALAZINE HCL 20 MG/ML IJ SOLN
10.0000 mg | Freq: Once | INTRAMUSCULAR | Status: AC
  Administered 2012-09-19: 10 mg via INTRAVENOUS
  Filled 2012-09-19: qty 1

## 2012-09-19 MED ORDER — HYDRALAZINE HCL 20 MG/ML IJ SOLN
10.0000 mg | Freq: Once | INTRAMUSCULAR | Status: AC
  Administered 2012-09-19: 10 mg via INTRAVENOUS

## 2012-09-19 MED ORDER — HYDRALAZINE HCL 25 MG PO TABS: 25.0000 mg | ORAL_TABLET | Freq: Three times a day (TID) | ORAL | Status: DC

## 2012-09-19 NOTE — Telephone Encounter (Signed)
Patient called c/o hypertension especially while receiving dialysis. His nephrologist is on vacation and he was requesting to see Dr. Johnnye Sima, who is also not in clinic. Advised that because he is insured he should go to urgent care to be evaluated, he agreed to this. Myrtis Hopping CMA

## 2012-09-19 NOTE — ED Notes (Signed)
Pt c/o increase in blood pressure.  Headache, blurred vision and dizziness. Pt denies sob, swelling, and chest pain.  Pt was told my home nurse to come in to be checked. Pt primary doctor is on vacation. Pt had recent hospital visit on 12/28 due to blood pressure.

## 2012-09-19 NOTE — ED Notes (Signed)
Pt reports feeling nauseous.

## 2012-09-19 NOTE — ED Notes (Signed)
Pt reports he was at dialysis today and increased BP after treatment. Reports a headache, denies any chest pain or SOB. No neuro deficits present. Pt A&Ox4.

## 2012-09-19 NOTE — ED Provider Notes (Signed)
History     CSN: [id number]  Arrival date & time 09/19/12  1420   First MD Initiated Contact with Patient 09/19/12 1716      Chief Complaint  Patient presents with  . Hypertension    (Consider location/radiation/quality/duration/timing/severity/associated sxs/prior treatment) HPI Comments: Patient returns to the ER for evaluation of elevated blood pressure. Patient was seen by myself 2 days ago with similar complaints. Patient is a dialysis patient. He has had persistently elevated blood pressure last several days. He reports that his blood pressures 150s over 90s, but comes in at 200/111. Patient complains of blurred vision and headache. He had similar symptoms he was seen 2 days ago which resolved with blood pressure resolution. This denies chest pain, palpitations, shortness of breath. He had tremors when he was seen 2 days ago but these have resolved. It was felt at that time it was likely secondary to slight uremia. He has successfully undergone dialysis since he was last seen.  Patient is a 56 y.o. male presenting with hypertension.  Hypertension Associated symptoms include headaches.    Past Medical History  Diagnosis Date  . Hypertension   . Gout   . Anemia   . Seizures 06/02/2011  . Thrombocytopenia   . Chronic kidney disease     ESRD requiring hemodialysis  . Anxiety   . Hepatitis B carrier   . HIV infection   . Hemodialysis patient   . Seizure     Past Surgical History  Procedure Date  . Av fistula placement 06/02/11    Left radiocephalic AVF    Family History  Problem Relation Age of Onset  . Hypertension Mother   . Cancer Father     History  Substance Use Topics  . Smoking status: Current Some Day Smoker    Types: Cigarettes  . Smokeless tobacco: Never Used  . Alcohol Use: No      Review of Systems  Constitutional: Negative for fever.  Respiratory: Negative.   Cardiovascular: Negative.   Neurological: Positive for headaches. Negative for  numbness.  All other systems reviewed and are negative.    Allergies  Lisinopril and Penicillins  Home Medications   Current Outpatient Rx  Name  Route  Sig  Dispense  Refill  . ALBUTEROL SULFATE HFA 108 (90 BASE) MCG/ACT IN AERS   Inhalation   Inhale 2 puffs into the lungs every 4 (four) hours as needed. For shortness of breath         . AMLODIPINE BESYLATE 10 MG PO TABS   Oral   Take 10 mg by mouth 2 (two) times daily.         Marland Kitchen CALCITRIOL 0.25 MCG PO CAPS   Oral   Take 0.25 mcg by mouth 2 (two) times daily.         Marland Kitchen CALCIUM ACETATE 667 MG PO TABS   Oral   Take 2 tablets by mouth 3 (three) times daily with meals.          Marland Kitchen CARVEDILOL 12.5 MG PO TABS   Oral   Take 1 tablet (12.5 mg total) by mouth 2 (two) times daily with a meal.   60 tablet   3   . CINACALCET HCL 30 MG PO TABS   Oral   Take 30 mg by mouth daily.         Marland Kitchen LAMIVUDINE 5 MG/ML PO SOLN   Oral   Take 40 mg by mouth daily.         Marland Kitchen  RENA-VITE PO TABS   Oral   Take 1 tablet by mouth daily.           Marland Kitchen RALTEGRAVIR POTASSIUM 400 MG PO TABS   Oral   Take 1 tablet (400 mg total) by mouth 2 (two) times daily.   60 tablet   11   . TENOFOVIR DISOPROXIL FUMARATE 300 MG PO TABS   Oral   Take 300 mg by mouth once a week. saturdays         . TRAZODONE HCL 50 MG PO TABS   Oral   Take 50 mg by mouth at bedtime.           BP 200/111  Pulse 73  Temp 98.1 F (36.7 C) (Oral)  Resp 18  SpO2 100%  Physical Exam  Constitutional: He is oriented to person, place, and time. He appears well-developed and well-nourished. No distress.  HENT:  Head: Normocephalic and atraumatic.  Right Ear: Hearing normal.  Nose: Nose normal.  Mouth/Throat: Oropharynx is clear and moist and mucous membranes are normal.  Eyes: Conjunctivae normal and EOM are normal. Pupils are equal, round, and reactive to light.  Neck: Normal range of motion. Neck supple.  Cardiovascular: Normal rate, regular rhythm,  S1 normal and S2 normal.  Exam reveals no gallop and no friction rub.   No murmur heard. Pulmonary/Chest: Effort normal and breath sounds normal. No respiratory distress. He exhibits no tenderness.  Abdominal: Soft. Normal appearance and bowel sounds are normal. There is no hepatosplenomegaly. There is no tenderness. There is no rebound, no guarding, no tenderness at McBurney's point and negative Murphy's sign. No hernia.  Musculoskeletal: Normal range of motion.  Neurological: He is alert and oriented to person, place, and time. He has normal strength. No cranial nerve deficit or sensory deficit. Coordination normal. GCS eye subscore is 4. GCS verbal subscore is 5. GCS motor subscore is 6.  Skin: Skin is warm, dry and intact. No rash noted. No cyanosis.  Psychiatric: He has a normal mood and affect. His speech is normal and behavior is normal. Thought content normal.    ED Course  Procedures (including critical care time)   Date: 09/19/2012  Rate: 70  Rhythm: normal sinus rhythm  QRS Axis: normal  Intervals: normal  ST/T Wave abnormalities: nonspecific ST/T changes  Conduction Disutrbances:nonspecific intraventricular conduction delay  Narrative Interpretation:   Old EKG Reviewed: unchanged    Labs Reviewed  CBC - Abnormal; Notable for the following:    Hemoglobin 12.2 (*)     HCT 37.2 (*)     RDW 18.4 (*)     Platelets 88 (*)  CONSISTENT WITH PREVIOUS RESULT   All other components within normal limits  POCT I-STAT, CHEM 8 - Abnormal; Notable for the following:    Potassium 3.4 (*)     Chloride 93 (*)     BUN 28 (*)     Creatinine, Ser 8.00 (*)     Glucose, Bld 104 (*)     Calcium, Ion 1.10 (*)     All other components within normal limits  TROPONIN I   No results found.   No diagnosis found.    MDM  This presents to the ER for evaluation of hypertension. This is his second visit in 2 days. Patient was very hypertensive each time, but responded to IV medication.  Today he had headache but no other complaints. His blood pressure has now significantly improved 156/80 after IV hydralazine. Headache is resolved and  he is symptom free with a normal exam. Because of his refractory hypertension, however, I have consulted nephrology for help with blood pressure medication dosing. Dr. Lorrene Reid suggested simply adding hydralazine to his current regimen. This will be initiated, patient was discharged to followup with his primary physician and nephrologist. Return if symptoms worsen.        Orpah Greek, MD 09/19/12 [phone]

## 2012-09-19 NOTE — ED Provider Notes (Signed)
History     CSN: [id number]  Arrival date & time 09/19/12  58   First MD Initiated Contact with Patient 09/19/12 1346      Chief Complaint  Patient presents with  . Hypertension    pt was seen by home nurse that informed him his pressure is up was told to come here both primary docs are on vaction till jan 8    (Consider location/radiation/quality/duration/timing/severity/associated sxs/prior treatment) Patient is a 56 y.o. male presenting with hypertension. The history is provided by the patient and a friend.  Hypertension This is a chronic problem. The current episode started more than 1 week ago (seen for same in ER on 12/29, went home and had falling episodes, involuntary ext movements, dizziness and recurrent headaches.). The problem has been gradually worsening. Associated symptoms include headaches. Pertinent negatives include no shortness of breath.    Past Medical History  Diagnosis Date  . Hypertension   . Gout   . Anemia   . Seizures 06/02/2011  . Thrombocytopenia   . Chronic kidney disease     ESRD requiring hemodialysis  . Anxiety   . Hepatitis B carrier   . HIV infection   . Hemodialysis patient   . Seizure     Past Surgical History  Procedure Date  . Av fistula placement 06/02/11    Left radiocephalic AVF    Family History  Problem Relation Age of Onset  . Hypertension Mother   . Cancer Father     History  Substance Use Topics  . Smoking status: Current Some Day Smoker    Types: Cigarettes  . Smokeless tobacco: Never Used  . Alcohol Use: No      Review of Systems  Respiratory: Negative for chest tightness and shortness of breath.   Gastrointestinal: Negative.   Neurological: Positive for dizziness and headaches.    Allergies  Lisinopril and Penicillins  Home Medications   Current Outpatient Rx  Name  Route  Sig  Dispense  Refill  . AMLODIPINE BESYLATE 10 MG PO TABS   Oral   Take 10 mg by mouth 2 (two) times daily.           Marland Kitchen CALCITRIOL 0.25 MCG PO CAPS   Oral   Take 0.25 mcg by mouth 2 (two) times daily.         Marland Kitchen CALCIUM ACETATE 667 MG PO TABS   Oral   Take 667 mg by mouth 3 (three) times daily with meals.          Marland Kitchen CARVEDILOL 12.5 MG PO TABS   Oral   Take 1 tablet (12.5 mg total) by mouth 2 (two) times daily with a meal.   60 tablet   3   . CINACALCET HCL 30 MG PO TABS   Oral   Take 30 mg by mouth daily.         Marland Kitchen LAMIVUDINE 5 MG/ML PO SOLN   Oral   Take 40 mg by mouth daily.         Marland Kitchen RENA-VITE PO TABS   Oral   Take 1 tablet by mouth daily.           Marland Kitchen RALTEGRAVIR POTASSIUM 400 MG PO TABS   Oral   Take 1 tablet (400 mg total) by mouth 2 (two) times daily.   60 tablet   11   . TENOFOVIR DISOPROXIL FUMARATE 300 MG PO TABS   Oral   Take 300 mg by mouth every 7 (seven)  days. On Saturdays         . TRAZODONE HCL 50 MG PO TABS   Oral   Take 50 mg by mouth at bedtime.         . ALBUTEROL SULFATE HFA 108 (90 BASE) MCG/ACT IN AERS   Inhalation   Inhale 2 puffs into the lungs every 4 (four) hours as needed. For shortness of breath         . HYDRALAZINE HCL 25 MG PO TABS   Oral   Take 1 tablet (25 mg total) by mouth 3 (three) times daily.   90 tablet   0     BP 177/89  Pulse 73  Temp 98.1 F (36.7 C) (Oral)  Resp 18  SpO2 99%  Physical Exam  Nursing note and vitals reviewed. Constitutional: He is oriented to person, place, and time. He appears well-developed and well-nourished.  HENT:  Head: Normocephalic.  Eyes: Pupils are equal, round, and reactive to light.  Cardiovascular: Normal rate and regular rhythm.   Pulmonary/Chest: Breath sounds normal.  Musculoskeletal: He exhibits no edema.  Neurological: He is alert and oriented to person, place, and time.  Skin: Skin is warm and dry.    ED Course  Procedures (including critical care time)  Labs Reviewed - No data to display No results found.   1. ESRD (end stage renal disease) on dialysis   2.  Hypertension, renal       MDM          Billy Fischer, MD 09/21/12 1946

## 2012-09-20 DIAGNOSIS — N186 End stage renal disease: Secondary | ICD-10-CM | POA: Diagnosis not present

## 2012-09-21 DIAGNOSIS — N186 End stage renal disease: Secondary | ICD-10-CM | POA: Diagnosis not present

## 2012-09-21 DIAGNOSIS — D509 Iron deficiency anemia, unspecified: Secondary | ICD-10-CM | POA: Diagnosis not present

## 2012-09-21 DIAGNOSIS — D631 Anemia in chronic kidney disease: Secondary | ICD-10-CM | POA: Diagnosis not present

## 2012-09-30 ENCOUNTER — Other Ambulatory Visit: Payer: Self-pay | Admitting: *Deleted

## 2012-09-30 DIAGNOSIS — F419 Anxiety disorder, unspecified: Secondary | ICD-10-CM

## 2012-09-30 DIAGNOSIS — Z992 Dependence on renal dialysis: Secondary | ICD-10-CM | POA: Diagnosis not present

## 2012-09-30 DIAGNOSIS — E039 Hypothyroidism, unspecified: Secondary | ICD-10-CM | POA: Diagnosis not present

## 2012-09-30 MED ORDER — TRAZODONE HCL 50 MG PO TABS: 50.0000 mg | ORAL_TABLET | Freq: Every day | ORAL | Status: DC

## 2012-09-30 NOTE — Telephone Encounter (Signed)
Patient called about this medication and needed refills. After research the med was held while the patient was in patient in December. Per provider ok to refill as usual.

## 2012-10-11 ENCOUNTER — Telehealth: Payer: Self-pay | Admitting: *Deleted

## 2012-10-11 NOTE — Telephone Encounter (Signed)
Patient called and advised he fell while getting into an ambulance and it is now swollen and painful. He wants to see Dr Johnnye Sima because he thinks he needs to have an xray. He can barely put weight on it and it does not seem to be getting any better. Dr Johnnye Sima had an appt at 930.

## 2012-10-12 ENCOUNTER — Ambulatory Visit (INDEPENDENT_AMBULATORY_CARE_PROVIDER_SITE_OTHER): Payer: Medicare Other | Admitting: Infectious Diseases

## 2012-10-12 ENCOUNTER — Telehealth: Payer: Self-pay | Admitting: *Deleted

## 2012-10-12 ENCOUNTER — Encounter: Payer: Self-pay | Admitting: Infectious Diseases

## 2012-10-12 VITALS — BP 173/88 | HR 80 | Temp 98.4°F | Ht 72.0 in | Wt 224.0 lb

## 2012-10-12 DIAGNOSIS — Z113 Encounter for screening for infections with a predominantly sexual mode of transmission: Secondary | ICD-10-CM | POA: Diagnosis not present

## 2012-10-12 DIAGNOSIS — Z79899 Other long term (current) drug therapy: Secondary | ICD-10-CM | POA: Diagnosis not present

## 2012-10-12 DIAGNOSIS — T148XXA Other injury of unspecified body region, initial encounter: Secondary | ICD-10-CM | POA: Diagnosis not present

## 2012-10-12 DIAGNOSIS — B20 Human immunodeficiency virus [HIV] disease: Secondary | ICD-10-CM | POA: Diagnosis not present

## 2012-10-12 DIAGNOSIS — T82598A Other mechanical complication of other cardiac and vascular devices and implants, initial encounter: Secondary | ICD-10-CM | POA: Diagnosis not present

## 2012-10-12 DIAGNOSIS — N186 End stage renal disease: Secondary | ICD-10-CM

## 2012-10-12 DIAGNOSIS — Z01810 Encounter for preprocedural cardiovascular examination: Secondary | ICD-10-CM | POA: Diagnosis not present

## 2012-10-12 LAB — CBC
MCV: 84.9 fL (ref 78.0–100.0)
Platelets: 164 10*3/uL (ref 150–400)
RBC: 4.1 MIL/uL — ABNORMAL LOW (ref 4.22–5.81)
RDW: 17.7 % — ABNORMAL HIGH (ref 11.5–15.5)
WBC: 5.8 10*3/uL (ref 4.0–10.5)

## 2012-10-12 LAB — LIPID PANEL
Cholesterol: 86 mg/dL (ref 0–200)
Triglycerides: 62 mg/dL (ref <150)
VLDL: 12 mg/dL (ref 0–40)

## 2012-10-12 LAB — COMPLETE METABOLIC PANEL WITH GFR
AST: 15 U/L (ref 0–37)
Albumin: 4.3 g/dL (ref 3.5–5.2)
BUN: 49 mg/dL — ABNORMAL HIGH (ref 6–23)
Calcium: 9.6 mg/dL (ref 8.4–10.5)
Chloride: 94 mEq/L — ABNORMAL LOW (ref 96–112)
Creat: 11.51 mg/dL (ref 0.50–1.35)
GFR, Est African American: 5 mL/min — ABNORMAL LOW
GFR, Est Non African American: 4 mL/min — ABNORMAL LOW
Glucose, Bld: 121 mg/dL — ABNORMAL HIGH (ref 70–99)

## 2012-10-12 LAB — RPR

## 2012-10-12 NOTE — Assessment & Plan Note (Signed)
I asked him to let us know if this does not resolve. I do not feel bony abn, he is baring wt normally, there is no fluctuance.

## 2012-10-12 NOTE — Progress Notes (Signed)
  Subjective:    Patient ID: Jeffrey [name], male    DOB: 1956-08-05, 57 y.o.   MRN: [id number]  HPI 57 yo M with HIV+, ESRD, and admission December 2012 with Influenza and his HD catheter was infected. Currently on TFV/3TC/ISN. Has been doing HD at home.  [address] getting into ambulance, then again when got home. Able to bear wt normally, has no pain.   HIV 1 RNA Quant (copies/mL)  Date Value  05/31/2012 <20   11/25/2011 <20   08/26/2011 <20      CD4 T Cell Abs (cmm)  Date Value  05/31/2012 290*  11/25/2011 320*  08/26/2011 400    States his BP is normally this high, goes much lower when he does his HD (at home). Doesn't want to bottom out so schedule his anti-htn meds accordingly.   Review of Systems  Gastrointestinal: Positive for constipation.  Genitourinary: Negative for dysuria and difficulty urinating.       Objective:   Physical Exam  Constitutional: He appears well-developed and well-nourished.  HENT:  Mouth/Throat: No oropharyngeal exudate.  Eyes: EOM are normal.  Cardiovascular: Normal rate and regular rhythm.   Pulmonary/Chest: Effort normal and breath sounds normal.  Abdominal: Soft. Bowel sounds are normal. There is no tenderness.  Musculoskeletal:       He has a healed wound on his L pre-tibial tubercle. It is non-tender, there is no fluctuance. There is no underlying bony tenderness.  He has trace edema in his L foot (vs his R).           Assessment & Plan:

## 2012-10-12 NOTE — Addendum Note (Signed)
Addended by: Dolan Amen D on: 10/12/2012 11:03 AM   Modules accepted: Orders

## 2012-10-12 NOTE — Assessment & Plan Note (Signed)
Continued f/u with renal. His graft has a good bruit/thrill. There is some cordis/scarring proximally. There is no fluctuance or tenderness. He will continue to monitor his BP

## 2012-10-12 NOTE — Assessment & Plan Note (Signed)
He will get his labs done today. No difficulty with ART.

## 2012-10-12 NOTE — Telephone Encounter (Signed)
[facility] called and advised the patient Creatinine is a critical high 11.51. Advised will inform the provider.

## 2012-10-14 NOTE — Telephone Encounter (Signed)
This pt is already on dialysis

## 2012-10-21 DIAGNOSIS — N186 End stage renal disease: Secondary | ICD-10-CM | POA: Diagnosis not present

## 2012-10-21 DIAGNOSIS — S8010XA Contusion of unspecified lower leg, initial encounter: Secondary | ICD-10-CM | POA: Diagnosis not present

## 2012-10-22 DIAGNOSIS — D539 Nutritional anemia, unspecified: Secondary | ICD-10-CM | POA: Diagnosis not present

## 2012-10-22 DIAGNOSIS — N186 End stage renal disease: Secondary | ICD-10-CM | POA: Diagnosis not present

## 2012-10-22 DIAGNOSIS — D509 Iron deficiency anemia, unspecified: Secondary | ICD-10-CM | POA: Diagnosis not present

## 2012-10-22 DIAGNOSIS — D631 Anemia in chronic kidney disease: Secondary | ICD-10-CM | POA: Diagnosis not present

## 2012-10-24 DIAGNOSIS — D539 Nutritional anemia, unspecified: Secondary | ICD-10-CM | POA: Diagnosis not present

## 2012-10-24 DIAGNOSIS — N186 End stage renal disease: Secondary | ICD-10-CM | POA: Diagnosis not present

## 2012-10-24 DIAGNOSIS — D509 Iron deficiency anemia, unspecified: Secondary | ICD-10-CM | POA: Diagnosis not present

## 2012-10-24 DIAGNOSIS — D631 Anemia in chronic kidney disease: Secondary | ICD-10-CM | POA: Diagnosis not present

## 2012-10-26 DIAGNOSIS — N186 End stage renal disease: Secondary | ICD-10-CM | POA: Diagnosis not present

## 2012-10-26 DIAGNOSIS — N039 Chronic nephritic syndrome with unspecified morphologic changes: Secondary | ICD-10-CM | POA: Diagnosis not present

## 2012-10-26 DIAGNOSIS — D509 Iron deficiency anemia, unspecified: Secondary | ICD-10-CM | POA: Diagnosis not present

## 2012-10-26 DIAGNOSIS — D539 Nutritional anemia, unspecified: Secondary | ICD-10-CM | POA: Diagnosis not present

## 2012-10-27 DIAGNOSIS — D509 Iron deficiency anemia, unspecified: Secondary | ICD-10-CM | POA: Diagnosis not present

## 2012-10-27 DIAGNOSIS — D631 Anemia in chronic kidney disease: Secondary | ICD-10-CM | POA: Diagnosis not present

## 2012-10-27 DIAGNOSIS — D539 Nutritional anemia, unspecified: Secondary | ICD-10-CM | POA: Diagnosis not present

## 2012-10-27 DIAGNOSIS — N186 End stage renal disease: Secondary | ICD-10-CM | POA: Diagnosis not present

## 2012-10-28 DIAGNOSIS — D539 Nutritional anemia, unspecified: Secondary | ICD-10-CM | POA: Diagnosis not present

## 2012-10-28 DIAGNOSIS — D509 Iron deficiency anemia, unspecified: Secondary | ICD-10-CM | POA: Diagnosis not present

## 2012-10-28 DIAGNOSIS — N039 Chronic nephritic syndrome with unspecified morphologic changes: Secondary | ICD-10-CM | POA: Diagnosis not present

## 2012-10-28 DIAGNOSIS — N186 End stage renal disease: Secondary | ICD-10-CM | POA: Diagnosis not present

## 2012-10-29 DIAGNOSIS — D509 Iron deficiency anemia, unspecified: Secondary | ICD-10-CM | POA: Diagnosis not present

## 2012-10-29 DIAGNOSIS — D631 Anemia in chronic kidney disease: Secondary | ICD-10-CM | POA: Diagnosis not present

## 2012-10-29 DIAGNOSIS — D539 Nutritional anemia, unspecified: Secondary | ICD-10-CM | POA: Diagnosis not present

## 2012-10-29 DIAGNOSIS — N186 End stage renal disease: Secondary | ICD-10-CM | POA: Diagnosis not present

## 2012-10-31 DIAGNOSIS — D539 Nutritional anemia, unspecified: Secondary | ICD-10-CM | POA: Diagnosis not present

## 2012-10-31 DIAGNOSIS — D509 Iron deficiency anemia, unspecified: Secondary | ICD-10-CM | POA: Diagnosis not present

## 2012-10-31 DIAGNOSIS — D631 Anemia in chronic kidney disease: Secondary | ICD-10-CM | POA: Diagnosis not present

## 2012-10-31 DIAGNOSIS — N186 End stage renal disease: Secondary | ICD-10-CM | POA: Diagnosis not present

## 2012-11-01 ENCOUNTER — Other Ambulatory Visit: Payer: Medicare Other

## 2012-11-02 DIAGNOSIS — D631 Anemia in chronic kidney disease: Secondary | ICD-10-CM | POA: Diagnosis not present

## 2012-11-02 DIAGNOSIS — D539 Nutritional anemia, unspecified: Secondary | ICD-10-CM | POA: Diagnosis not present

## 2012-11-02 DIAGNOSIS — N186 End stage renal disease: Secondary | ICD-10-CM | POA: Diagnosis not present

## 2012-11-02 DIAGNOSIS — D509 Iron deficiency anemia, unspecified: Secondary | ICD-10-CM | POA: Diagnosis not present

## 2012-11-04 DIAGNOSIS — D539 Nutritional anemia, unspecified: Secondary | ICD-10-CM | POA: Diagnosis not present

## 2012-11-04 DIAGNOSIS — N186 End stage renal disease: Secondary | ICD-10-CM | POA: Diagnosis not present

## 2012-11-04 DIAGNOSIS — D509 Iron deficiency anemia, unspecified: Secondary | ICD-10-CM | POA: Diagnosis not present

## 2012-11-04 DIAGNOSIS — N039 Chronic nephritic syndrome with unspecified morphologic changes: Secondary | ICD-10-CM | POA: Diagnosis not present

## 2012-11-05 DIAGNOSIS — N186 End stage renal disease: Secondary | ICD-10-CM | POA: Diagnosis not present

## 2012-11-05 DIAGNOSIS — D539 Nutritional anemia, unspecified: Secondary | ICD-10-CM | POA: Diagnosis not present

## 2012-11-05 DIAGNOSIS — D509 Iron deficiency anemia, unspecified: Secondary | ICD-10-CM | POA: Diagnosis not present

## 2012-11-05 DIAGNOSIS — N039 Chronic nephritic syndrome with unspecified morphologic changes: Secondary | ICD-10-CM | POA: Diagnosis not present

## 2012-11-07 ENCOUNTER — Ambulatory Visit: Payer: Medicare Other | Admitting: Infectious Diseases

## 2012-11-07 DIAGNOSIS — N186 End stage renal disease: Secondary | ICD-10-CM | POA: Diagnosis not present

## 2012-11-07 DIAGNOSIS — D509 Iron deficiency anemia, unspecified: Secondary | ICD-10-CM | POA: Diagnosis not present

## 2012-11-07 DIAGNOSIS — D631 Anemia in chronic kidney disease: Secondary | ICD-10-CM | POA: Diagnosis not present

## 2012-11-07 DIAGNOSIS — D539 Nutritional anemia, unspecified: Secondary | ICD-10-CM | POA: Diagnosis not present

## 2012-11-08 ENCOUNTER — Other Ambulatory Visit: Payer: Self-pay | Admitting: *Deleted

## 2012-11-08 ENCOUNTER — Ambulatory Visit (INDEPENDENT_AMBULATORY_CARE_PROVIDER_SITE_OTHER): Payer: Medicare Other | Admitting: Infectious Diseases

## 2012-11-08 ENCOUNTER — Encounter: Payer: Self-pay | Admitting: Infectious Diseases

## 2012-11-08 VITALS — BP 161/90 | HR 68 | Temp 97.2°F | Wt 221.0 lb

## 2012-11-08 DIAGNOSIS — N186 End stage renal disease: Secondary | ICD-10-CM

## 2012-11-08 DIAGNOSIS — F411 Generalized anxiety disorder: Secondary | ICD-10-CM | POA: Diagnosis not present

## 2012-11-08 DIAGNOSIS — F419 Anxiety disorder, unspecified: Secondary | ICD-10-CM

## 2012-11-08 DIAGNOSIS — B20 Human immunodeficiency virus [HIV] disease: Secondary | ICD-10-CM | POA: Diagnosis not present

## 2012-11-08 MED ORDER — TRAZODONE HCL 50 MG PO TABS: 50.0000 mg | ORAL_TABLET | Freq: Every day | ORAL | Status: DC

## 2012-11-08 MED ORDER — ALPRAZOLAM 0.5 MG PO TABS: 0.5000 mg | ORAL_TABLET | Freq: Two times a day (BID) | ORAL | Status: DC | PRN

## 2012-11-08 NOTE — Assessment & Plan Note (Signed)
Appears to be doing well with home HD. Will cc Dr Justin Mend.

## 2012-11-08 NOTE — Assessment & Plan Note (Addendum)
He asks for a med refill, cannot remember name. He will call back with name. Will refill his trazadone. Appears to be doing well.

## 2012-11-08 NOTE — Progress Notes (Signed)
  Subjective:    Patient ID: HARITH EHRLER, male    DOB: Jun 24, 1956, 57 y.o.   MRN: [id number]  HPI 57 yo M with HIV+, ESRD, and admission December 2012 with Influenza and his HD catheter was infected. Currently on TFV/3TC/ISN. Has been doing HD at home.   HIV 1 RNA Quant (copies/mL)  Date Value  10/12/2012 <20   05/31/2012 <20   11/25/2011 <20      CD4 T Cell Abs (cmm)  Date Value  10/12/2012 310*  05/31/2012 290*  11/25/2011 320*    Was seen last month with a bruise, states that this has improved except for some occasional swelling.  No problems with HD, no problems with catheter.   Review of Systems  Constitutional: Negative for fever, chills and unexpected weight change.  Respiratory: Negative for shortness of breath.   Gastrointestinal: Negative for diarrhea and constipation.  Genitourinary: Negative for dysuria and hematuria.       Objective:   Physical Exam  Constitutional: He appears well-developed and well-nourished.  HENT:  Mouth/Throat: No oropharyngeal exudate.  Eyes: EOM are normal. Pupils are equal, round, and reactive to light.  Neck: Neck supple.  Cardiovascular: Normal rate, regular rhythm and normal heart sounds.   Pulmonary/Chest: Effort normal and breath sounds normal.  Abdominal: Soft. Bowel sounds are normal. There is no tenderness.  Musculoskeletal:       Arms: Lymphadenopathy:    He has no cervical adenopathy.          Assessment & Plan:

## 2012-11-08 NOTE — Assessment & Plan Note (Signed)
Doing very well on his current regiemen. Will check his Hep S Ab at next lab draw. Will see him back in 6 months. Offered colonoscopy, defers

## 2012-11-09 DIAGNOSIS — D631 Anemia in chronic kidney disease: Secondary | ICD-10-CM | POA: Diagnosis not present

## 2012-11-09 DIAGNOSIS — D539 Nutritional anemia, unspecified: Secondary | ICD-10-CM | POA: Diagnosis not present

## 2012-11-09 DIAGNOSIS — D509 Iron deficiency anemia, unspecified: Secondary | ICD-10-CM | POA: Diagnosis not present

## 2012-11-09 DIAGNOSIS — N186 End stage renal disease: Secondary | ICD-10-CM | POA: Diagnosis not present

## 2012-11-11 DIAGNOSIS — N186 End stage renal disease: Secondary | ICD-10-CM | POA: Diagnosis not present

## 2012-11-11 DIAGNOSIS — D509 Iron deficiency anemia, unspecified: Secondary | ICD-10-CM | POA: Diagnosis not present

## 2012-11-11 DIAGNOSIS — D539 Nutritional anemia, unspecified: Secondary | ICD-10-CM | POA: Diagnosis not present

## 2012-11-11 DIAGNOSIS — D631 Anemia in chronic kidney disease: Secondary | ICD-10-CM | POA: Diagnosis not present

## 2012-11-12 DIAGNOSIS — N039 Chronic nephritic syndrome with unspecified morphologic changes: Secondary | ICD-10-CM | POA: Diagnosis not present

## 2012-11-12 DIAGNOSIS — D509 Iron deficiency anemia, unspecified: Secondary | ICD-10-CM | POA: Diagnosis not present

## 2012-11-12 DIAGNOSIS — D539 Nutritional anemia, unspecified: Secondary | ICD-10-CM | POA: Diagnosis not present

## 2012-11-12 DIAGNOSIS — N186 End stage renal disease: Secondary | ICD-10-CM | POA: Diagnosis not present

## 2012-11-14 DIAGNOSIS — D539 Nutritional anemia, unspecified: Secondary | ICD-10-CM | POA: Diagnosis not present

## 2012-11-14 DIAGNOSIS — D509 Iron deficiency anemia, unspecified: Secondary | ICD-10-CM | POA: Diagnosis not present

## 2012-11-14 DIAGNOSIS — N039 Chronic nephritic syndrome with unspecified morphologic changes: Secondary | ICD-10-CM | POA: Diagnosis not present

## 2012-11-14 DIAGNOSIS — N186 End stage renal disease: Secondary | ICD-10-CM | POA: Diagnosis not present

## 2012-11-14 DIAGNOSIS — D631 Anemia in chronic kidney disease: Secondary | ICD-10-CM | POA: Diagnosis not present

## 2012-11-15 ENCOUNTER — Ambulatory Visit: Payer: Medicare Other | Admitting: Infectious Diseases

## 2012-11-20 DIAGNOSIS — D509 Iron deficiency anemia, unspecified: Secondary | ICD-10-CM | POA: Diagnosis not present

## 2012-11-20 DIAGNOSIS — N186 End stage renal disease: Secondary | ICD-10-CM | POA: Diagnosis not present

## 2012-11-22 DIAGNOSIS — D509 Iron deficiency anemia, unspecified: Secondary | ICD-10-CM | POA: Diagnosis not present

## 2012-11-22 DIAGNOSIS — N186 End stage renal disease: Secondary | ICD-10-CM | POA: Diagnosis not present

## 2012-11-23 DIAGNOSIS — D509 Iron deficiency anemia, unspecified: Secondary | ICD-10-CM | POA: Diagnosis not present

## 2012-11-23 DIAGNOSIS — N186 End stage renal disease: Secondary | ICD-10-CM | POA: Diagnosis not present

## 2012-11-24 DIAGNOSIS — D509 Iron deficiency anemia, unspecified: Secondary | ICD-10-CM | POA: Diagnosis not present

## 2012-11-24 DIAGNOSIS — N186 End stage renal disease: Secondary | ICD-10-CM | POA: Diagnosis not present

## 2012-11-24 IMAGING — CT CT HEAD W/O CM
1 of 2 series · 13 of 30 positions shown, 17 images · non-contrast
Comparison: 09/21/2006.

CLINICAL DATA: Seizure.  [place]sterhout before seizure.

CT HEAD WITHOUT CONTRAST
TECHNIQUE: Contiguous axial images were obtained from the base of
the skull through the vertex without contrast.

[Series 2: brain · axial · 0.47mm/px · z∈[+118,+258]mm · 13 of 44 slices shown, 17 images]
[im 4/44  brain]
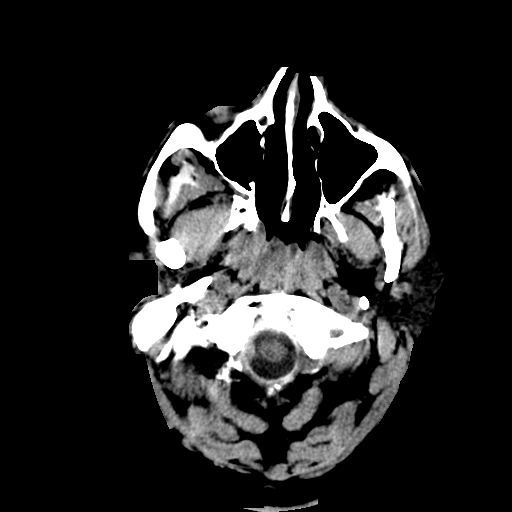
[im 4/44  bone]
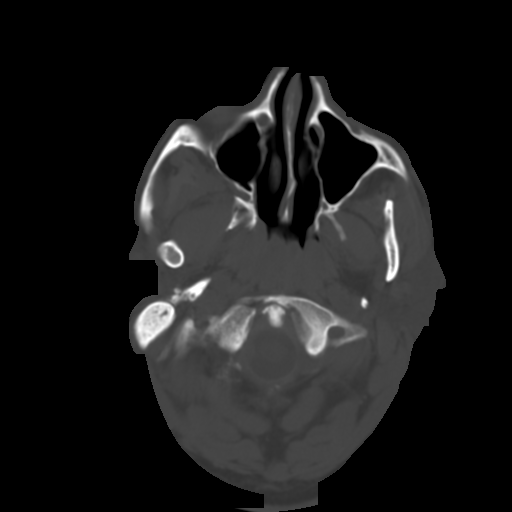
[im 7/44  brain]
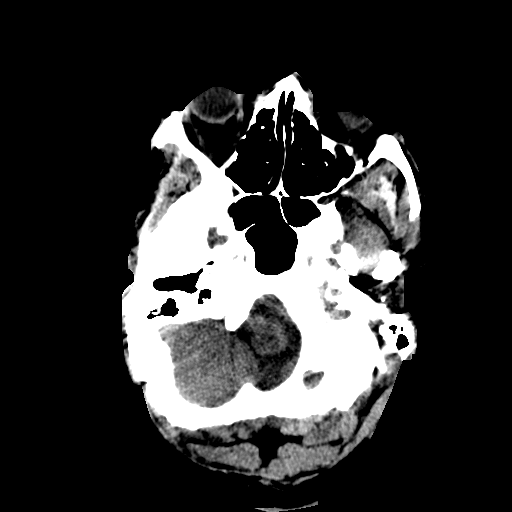
[im 10/44  brain]
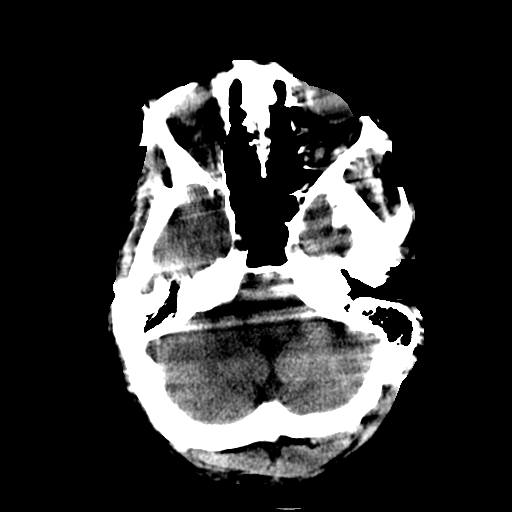
[im 13/44  brain]
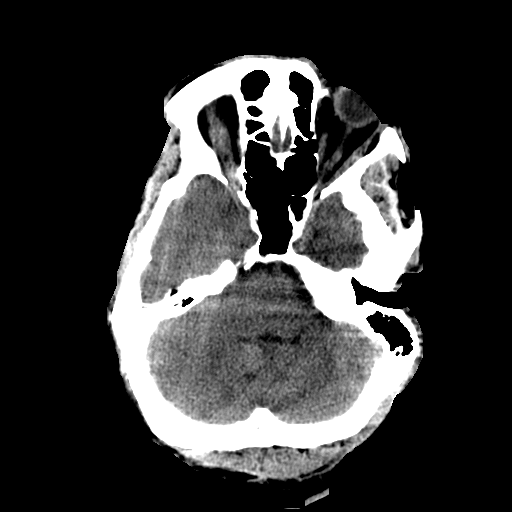
[im 16/44  brain]
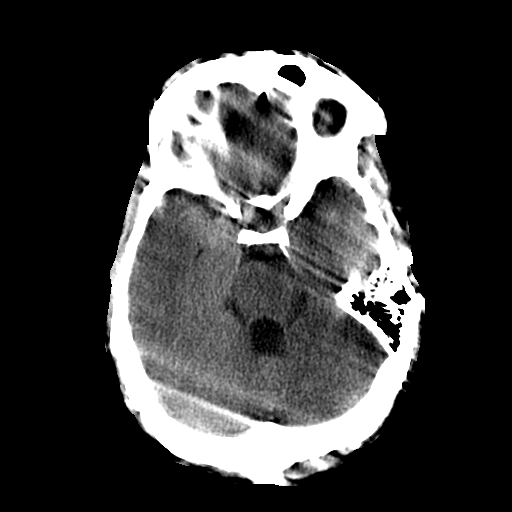
[im 16/44  bone]
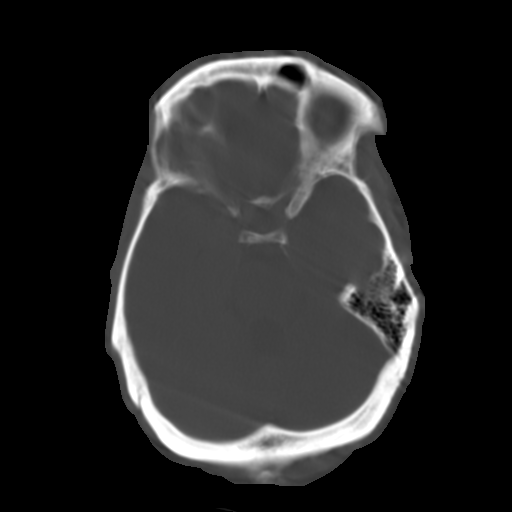
[im 19/44  brain]
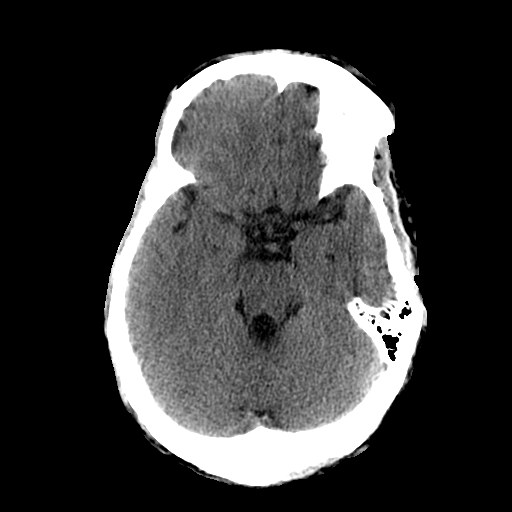
[im 22/44  brain]
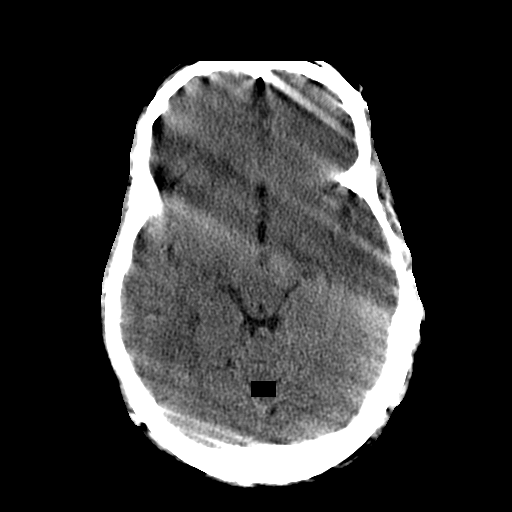
[im 25/44  brain]
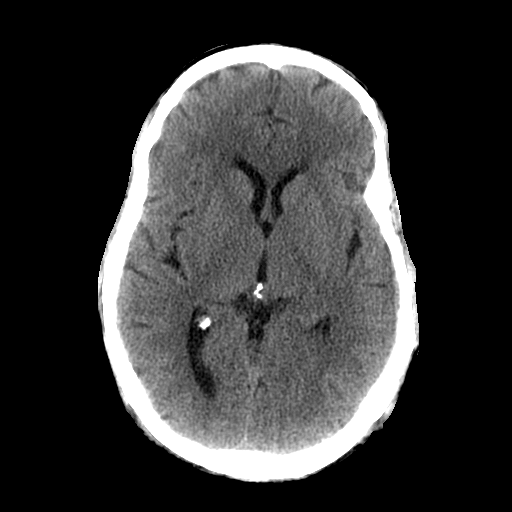
[im 28/44  brain]
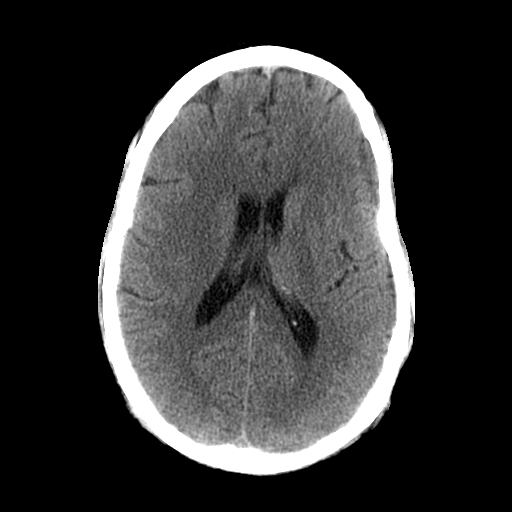
[im 28/44  bone]
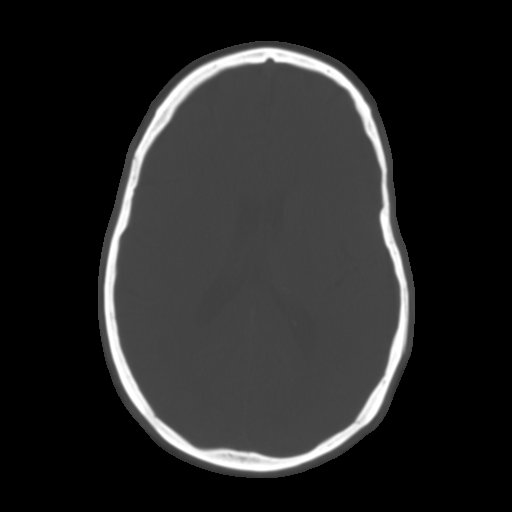
[im 31/44  brain]
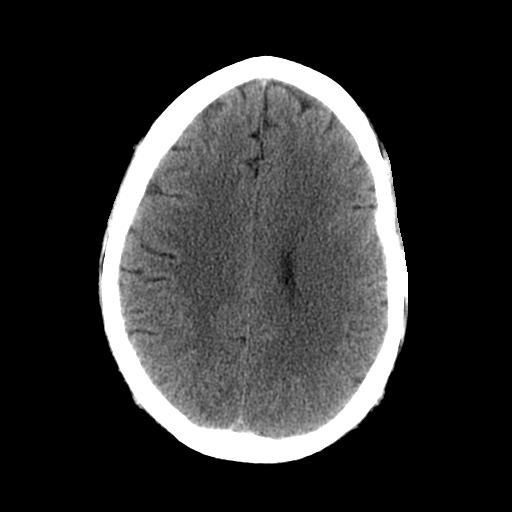
[im 34/44  brain]
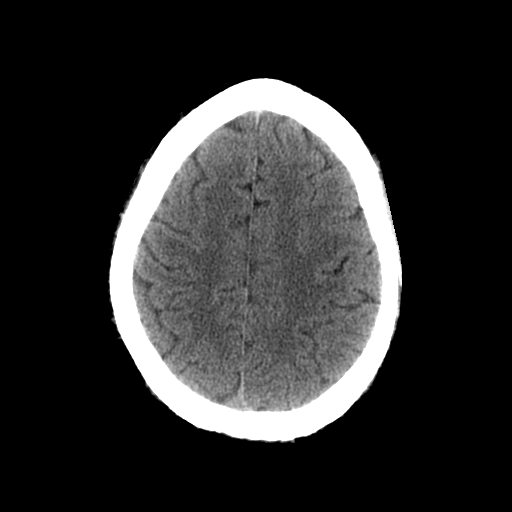
[im 37/44  brain]
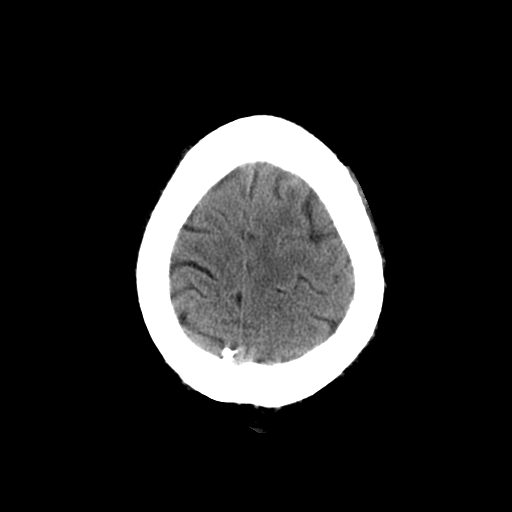
[im 40/44  brain]
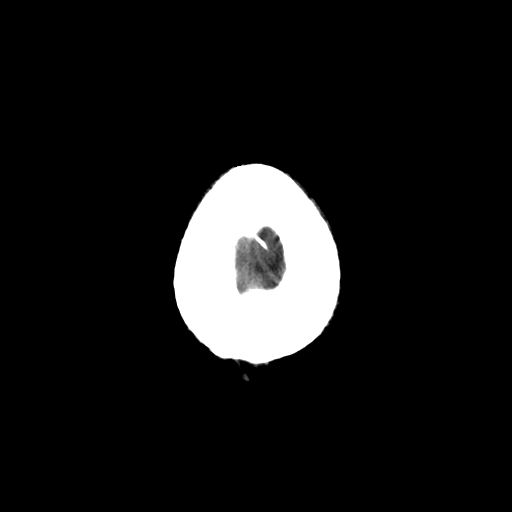
[im 40/44  bone]
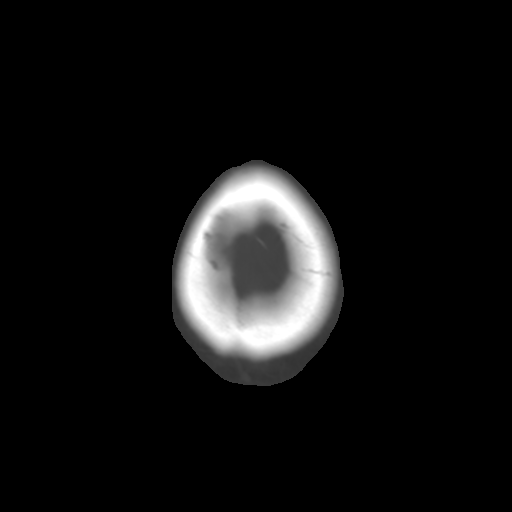

[13 of 30 positions shown; findings below may reference images not displayed]

FINDINGS: Calvarium intact.  No fracture.  Motion artifact is
present in the posterior fossa.  Scan was repeated.  The study is
diagnostic. No mass lesion, mass effect, midline shift,
hydrocephalus, hemorrhage.  No territorial ischemia or acute
infarction.  Mastoid air cells and paranasal sinuses appear clear.
IMPRESSION: Negative CT head.  No interval change.

## 2012-11-24 IMAGING — CR DG CHEST 1V PORT
1 series · 1 of 1 positions shown · non-contrast
Comparison: 02/18/2011

CLINICAL DATA: Smoker, seizure, hypertension

PORTABLE CHEST - 1 VIEW

[view not recorded]
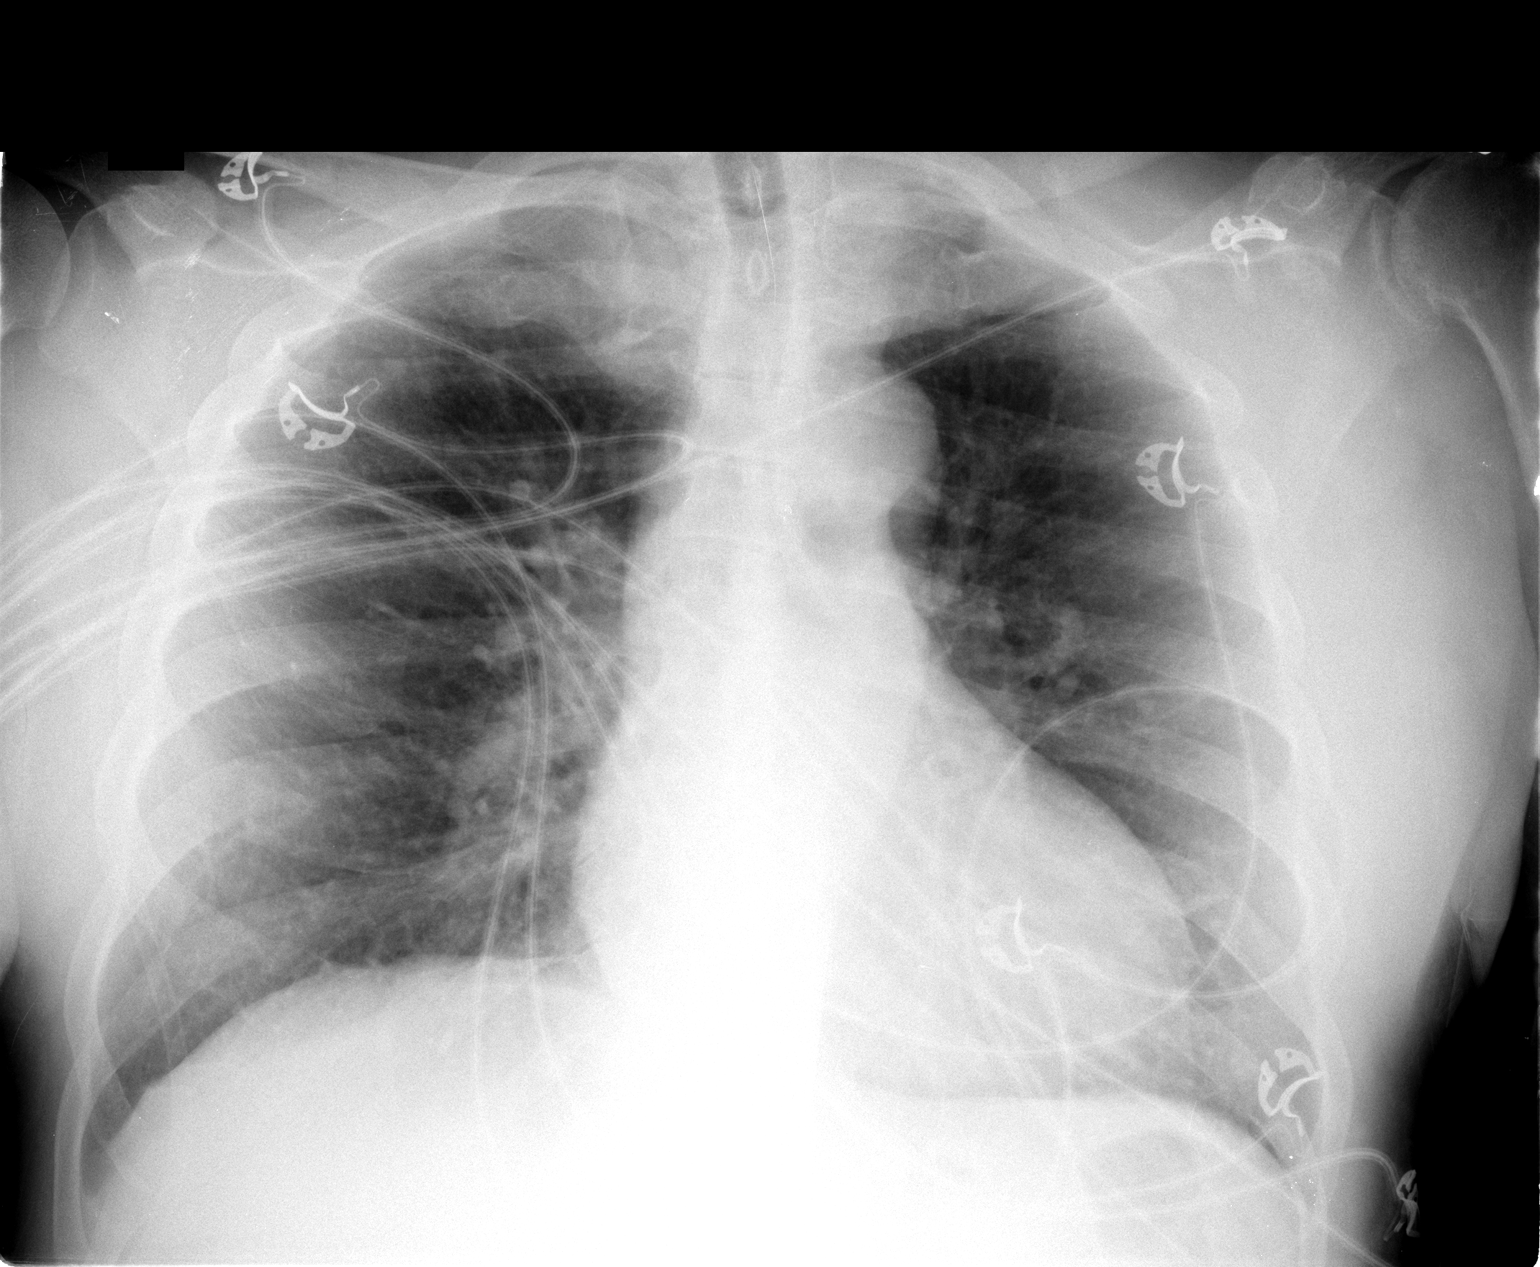

[1 of 1 positions shown; findings below may reference images not displayed]

FINDINGS: Normal heart size and vascularity.  Negative for
pneumonia, edema, effusion or pneumothorax.  Mild interstitial
prominence, stable.  Degenerative changes of the spine with an
associated scoliosis.
IMPRESSION: Mild chronic interstitial prominence.  No superimposed edema or
pneumonia.

## 2012-11-25 DIAGNOSIS — N186 End stage renal disease: Secondary | ICD-10-CM | POA: Diagnosis not present

## 2012-11-25 DIAGNOSIS — D509 Iron deficiency anemia, unspecified: Secondary | ICD-10-CM | POA: Diagnosis not present

## 2012-11-25 IMAGING — CR DG CHEST 1V PORT
1 series · 1 of 1 positions shown · non-contrast
Comparison: [DATE]

CLINICAL DATA: Dialysis catheter placement.

PORTABLE CHEST - 1 VIEW

[AP]
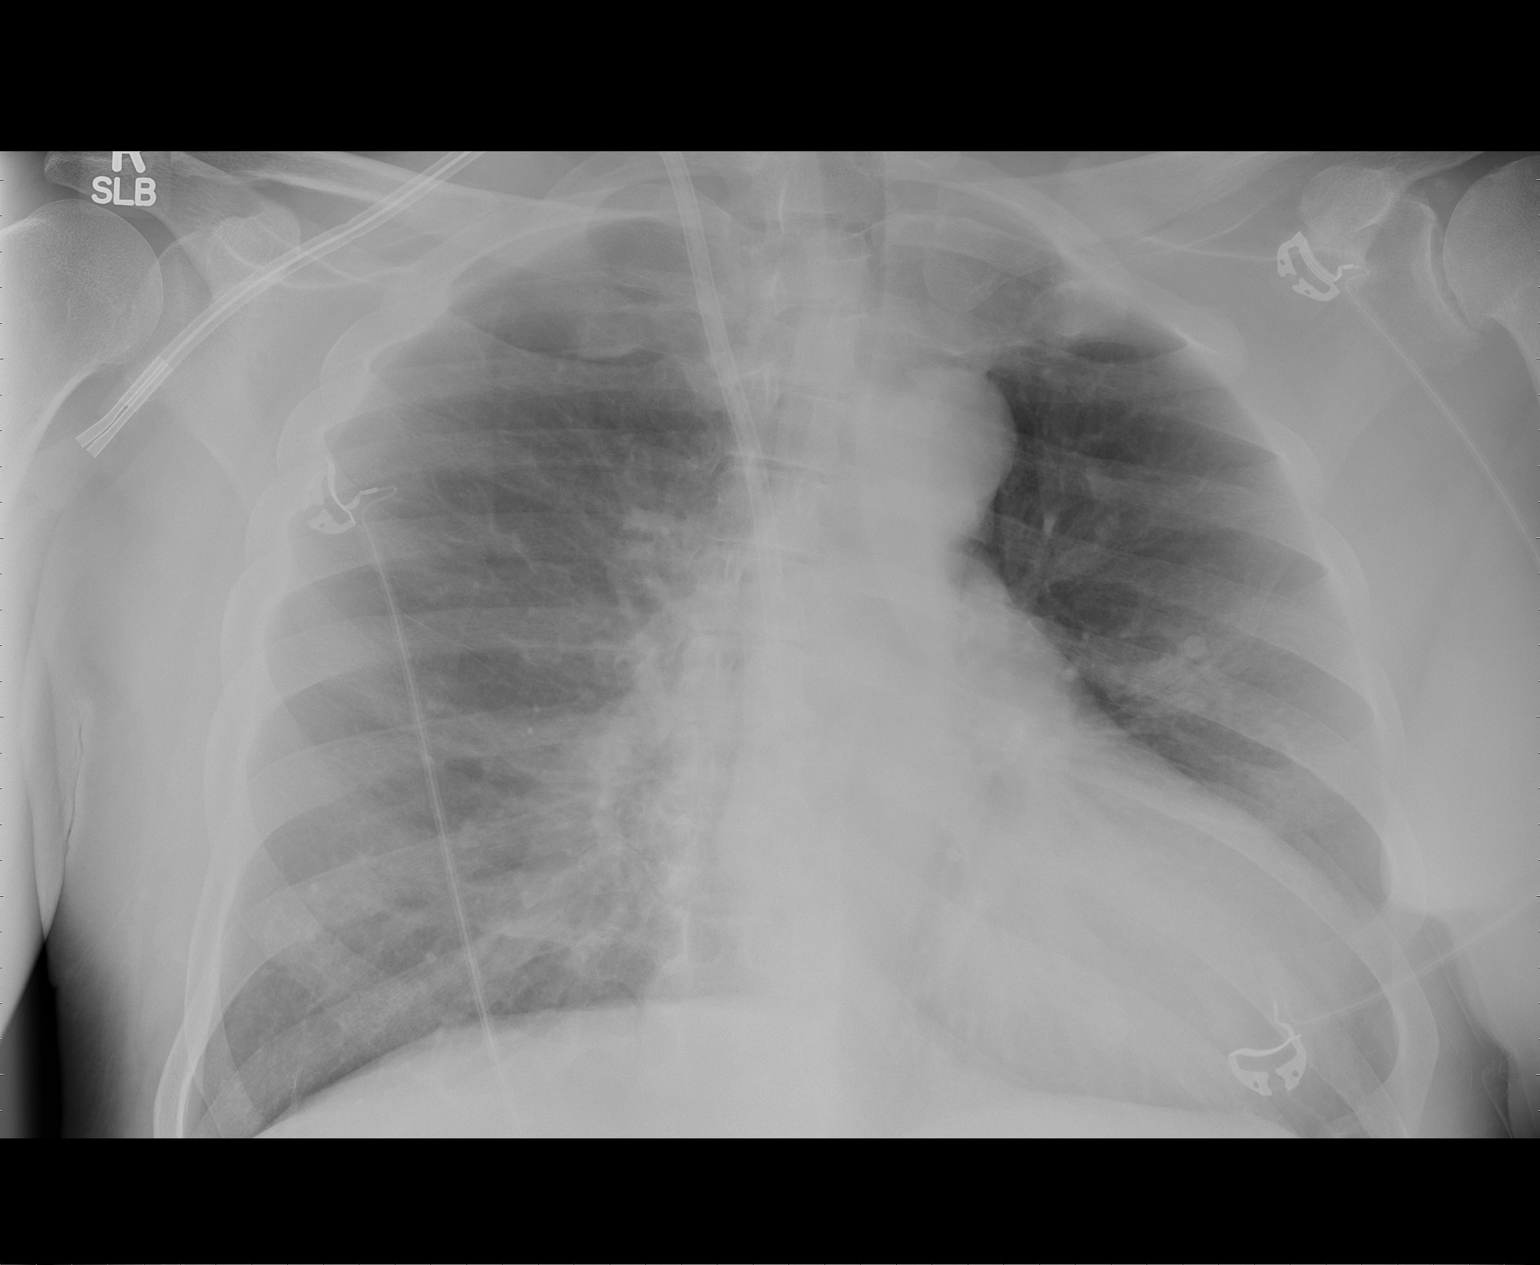

[1 of 1 positions shown; findings below may reference images not displayed]

FINDINGS: Right internal jugular dual lumen catheter is in place
with the tips in the SVC and at the SVC/RA junction.  No
pneumothorax.  Lungs remain clear.  There may be mild venous
hypertension.
IMPRESSION: Catheter well positioned.  No detectable complication.

## 2012-11-26 DIAGNOSIS — D509 Iron deficiency anemia, unspecified: Secondary | ICD-10-CM | POA: Diagnosis not present

## 2012-11-26 DIAGNOSIS — N186 End stage renal disease: Secondary | ICD-10-CM | POA: Diagnosis not present

## 2012-11-26 IMAGING — CR DG BONE SURVEY MET
1 series · 1 of 1 positions shown · non-contrast
Comparison: None.

CLINICAL DATA: Renal failure.

METASTATIC BONE SURVEY

[t t-spine lat]
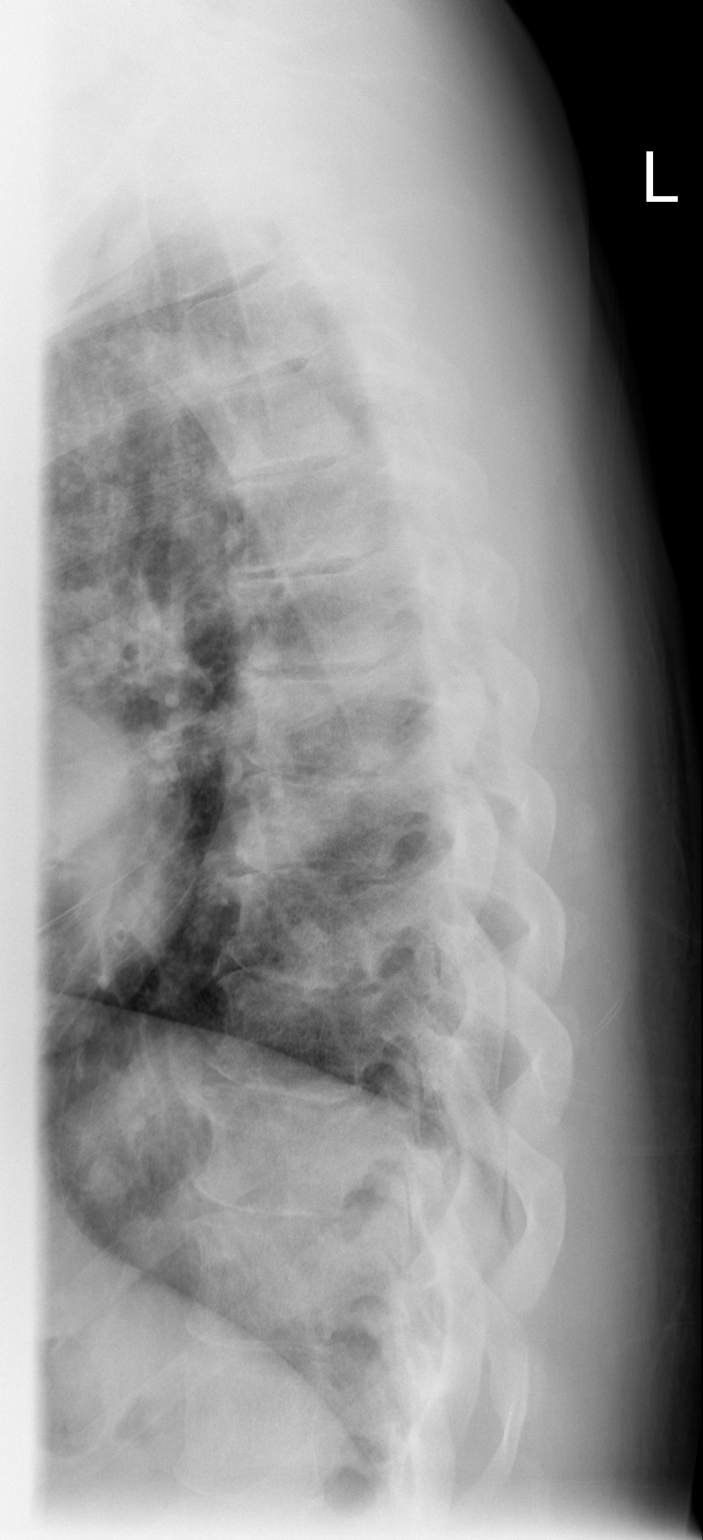

[1 of 1 positions shown; findings below may reference images not displayed]

FINDINGS: Lateral skull film is unremarkable.

Two views of the cervical spine show degenerative disc and endplate
disease.  Frontal views of the thoracolumbar spine shows some
degenerative endplate changes are otherwise unremarkable.  There is
facet disease in the lower lumbar spine.  Degenerative changes are
noted in the shoulders, left greater than right.  The right IJ
dialysis catheter appears slightly kinked in the region of the
tunnel.

Frontal view of each humerus again shows degenerative changes in
the left shoulder.  Otherwise no bony abnormality.

Frontal view of the right femur shows degenerative spurring of the
femoral head.  No lytic or sclerotic osseous abnormality.

Frontal view of the left femur shows degenerative changes in the
femoral head and acetabulum otherwise no bony abnormality.

Lateral views of the right lumbar spine from the degenerative disc
endplate changes.
IMPRESSION: Degenerative changes seen diffusely in the spine, left shoulder,
and both hips.

## 2012-11-26 IMAGING — CR DG BONE SURVEY MET
10 series · 10 of 10 positions shown · non-contrast
Comparison: None.

CLINICAL DATA: Renal failure.

METASTATIC BONE SURVEY

[w c-spine lat]
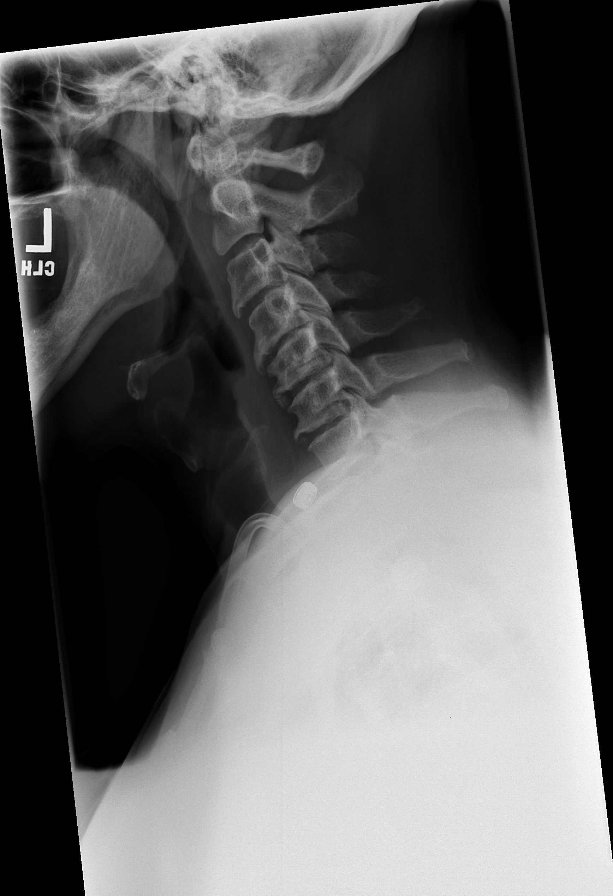

[t c-spine a.p.]
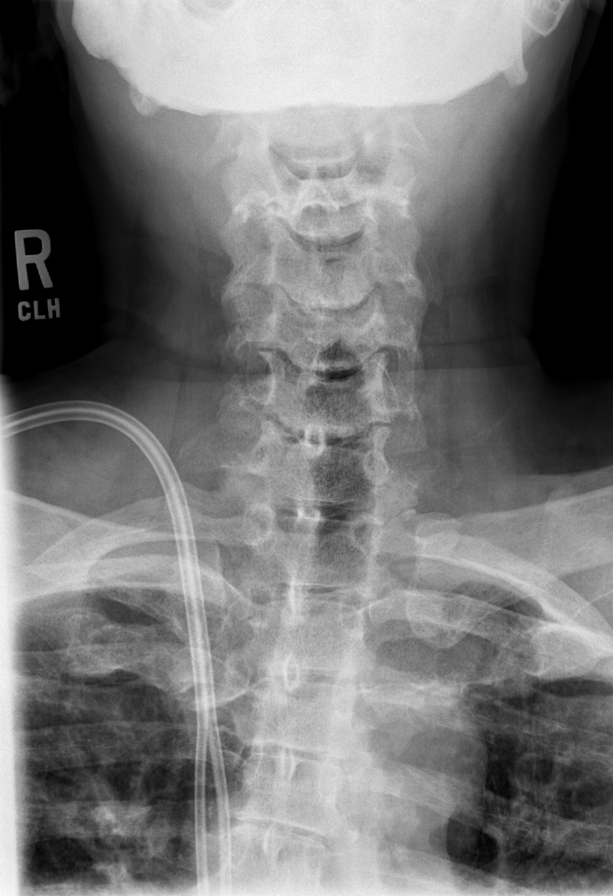

[t t-spine a.p.]
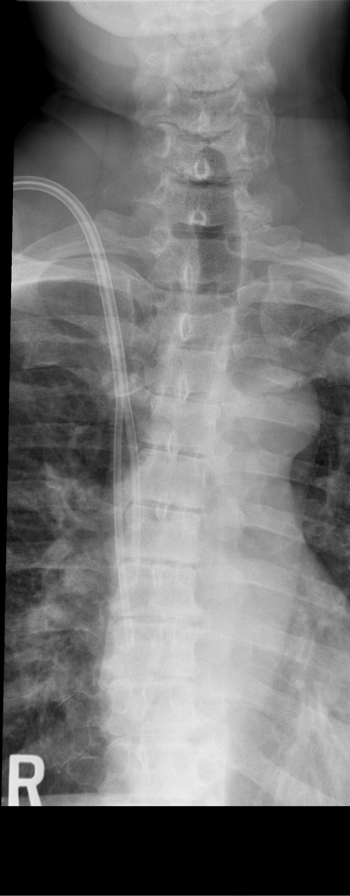

[t l-spine a.p.]
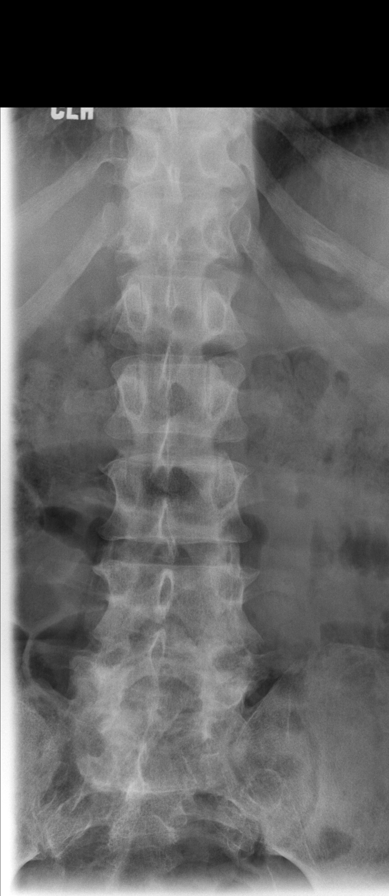

[t pelvis a.p.]
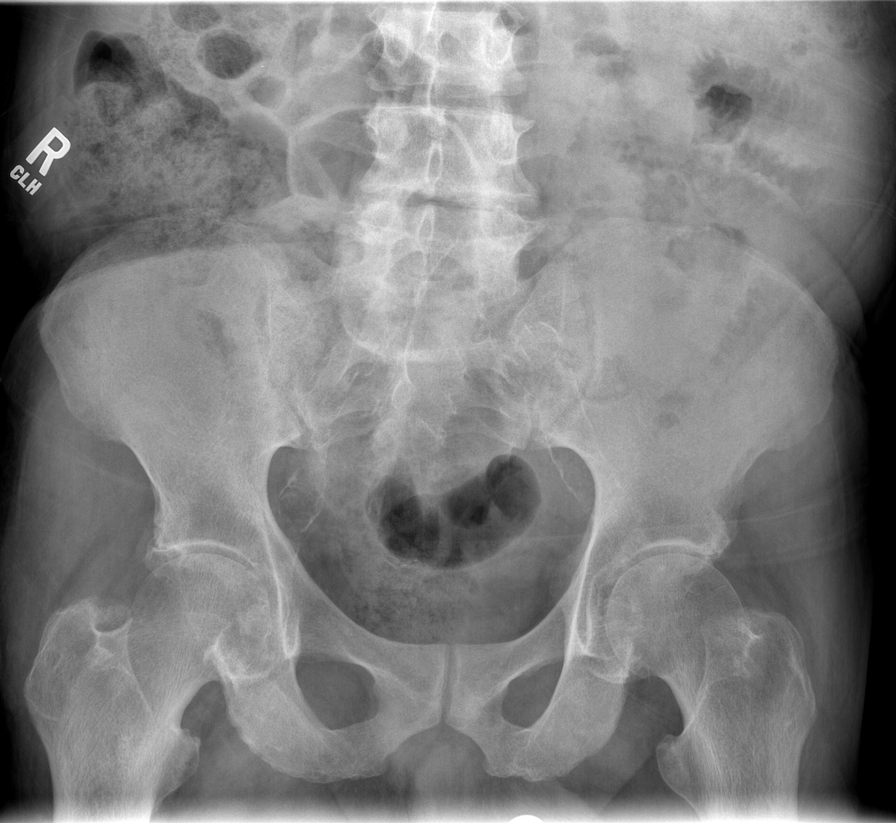

[t shoulder ap external righ]
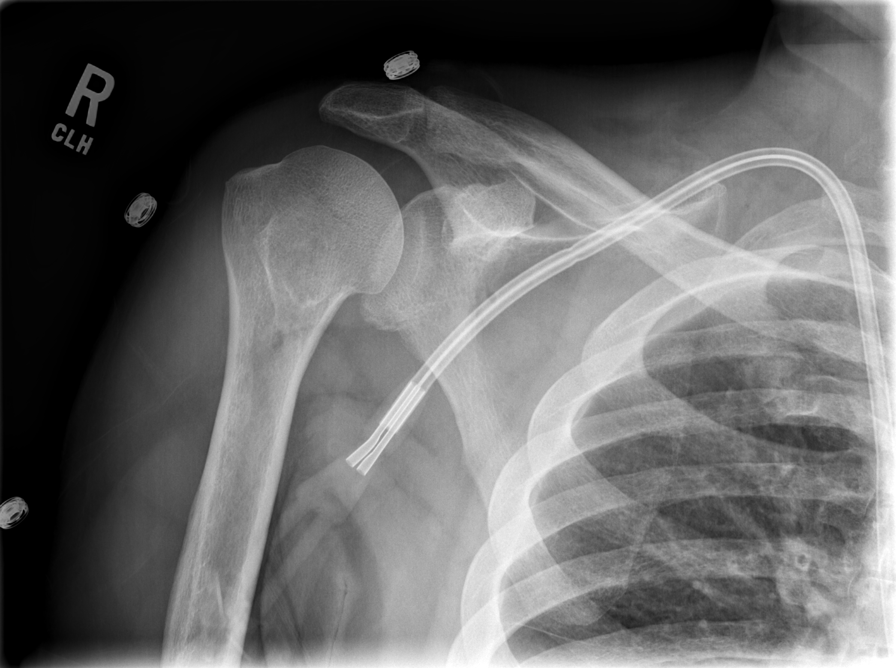

[t shoulder ap external left]
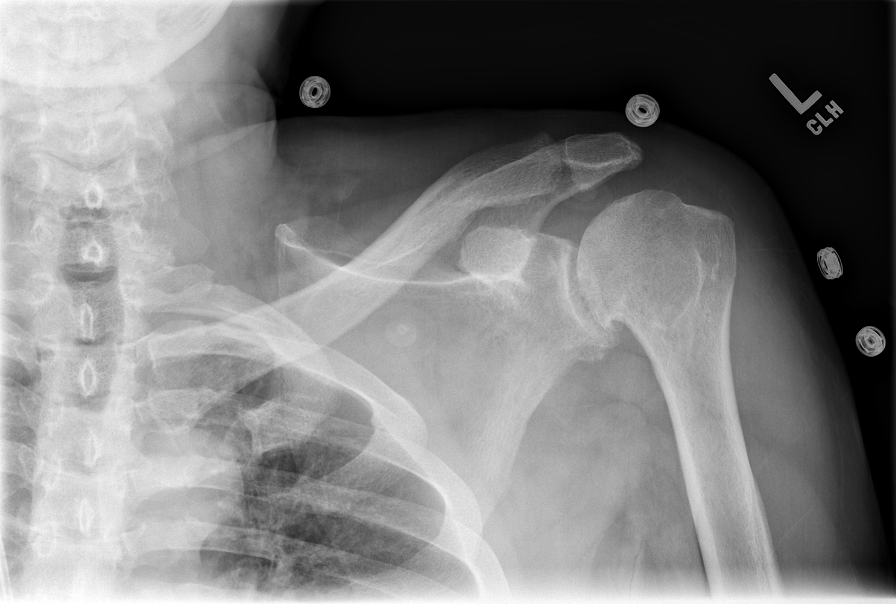

[t humerus ap left]
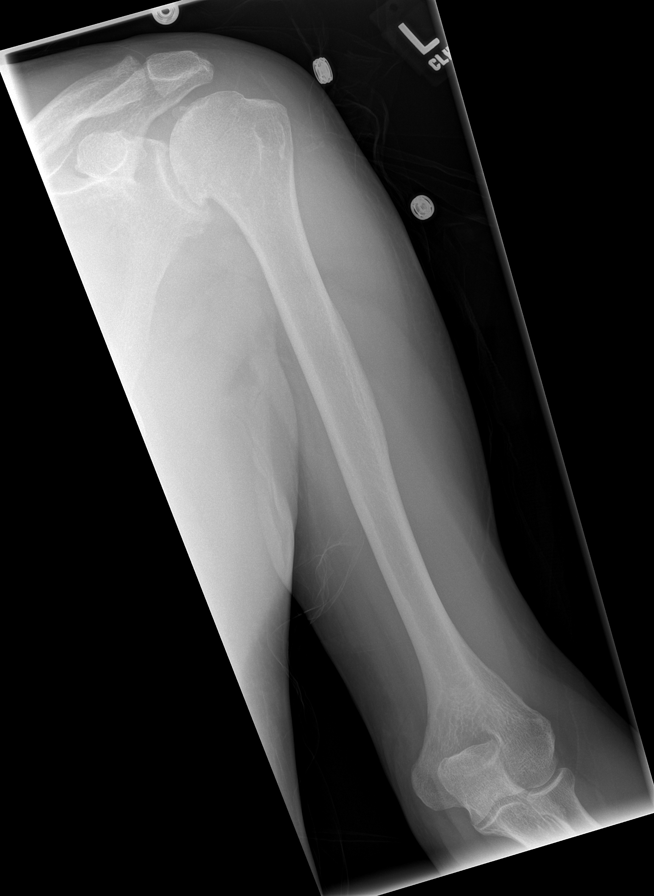

[t humerus ap right]
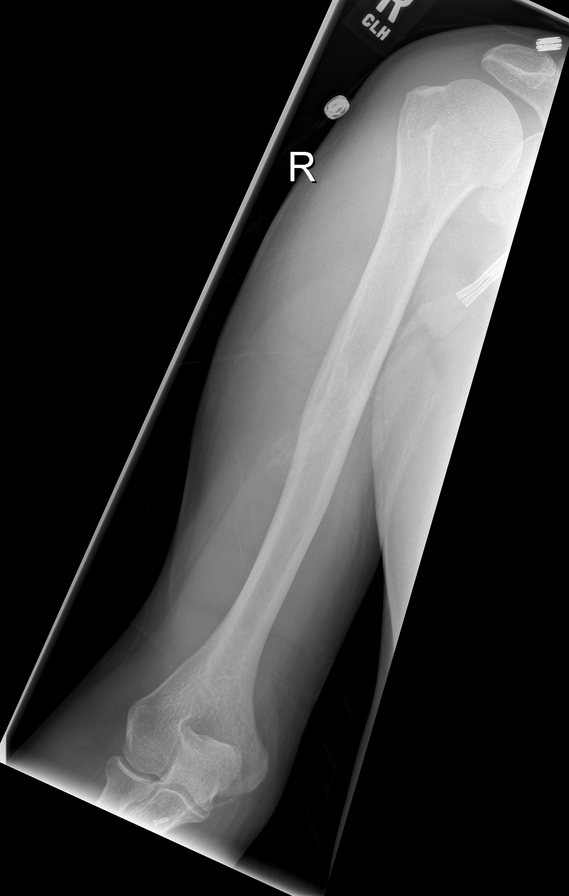

[t femur with hip  ap right]
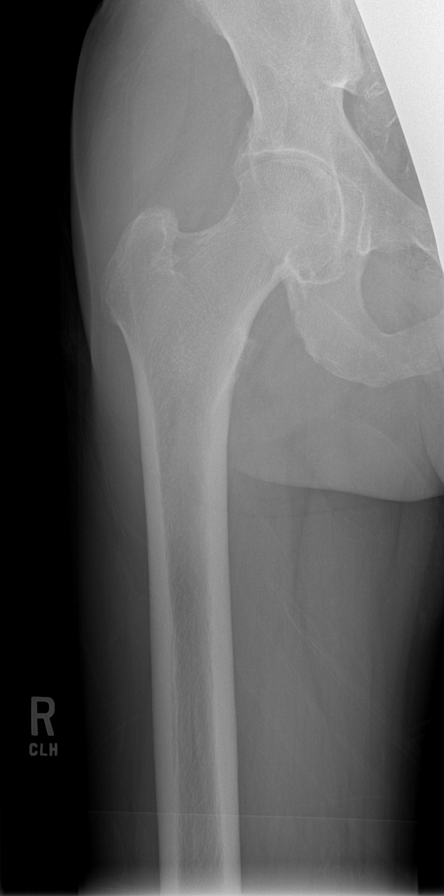

[10 of 10 positions shown; findings below may reference images not displayed]

FINDINGS: Lateral skull film is unremarkable.

Two views of the cervical spine show degenerative disc and endplate
disease.  Frontal views of the thoracolumbar spine shows some
degenerative endplate changes are otherwise unremarkable.  There is
facet disease in the lower lumbar spine.  Degenerative changes are
noted in the shoulders, left greater than right.  The right IJ
dialysis catheter appears slightly kinked in the region of the
tunnel.

Frontal view of each humerus again shows degenerative changes in
the left shoulder.  Otherwise no bony abnormality.

Frontal view of the right femur shows degenerative spurring of the
femoral head.  No lytic or sclerotic osseous abnormality.

Frontal view of the left femur shows degenerative changes in the
femoral head and acetabulum otherwise no bony abnormality.

Lateral views of the right lumbar spine from the degenerative disc
endplate changes.
IMPRESSION: Degenerative changes seen diffusely in the spine, left shoulder,
and both hips.

## 2012-11-28 DIAGNOSIS — D509 Iron deficiency anemia, unspecified: Secondary | ICD-10-CM | POA: Diagnosis not present

## 2012-11-28 DIAGNOSIS — N186 End stage renal disease: Secondary | ICD-10-CM | POA: Diagnosis not present

## 2012-11-28 IMAGING — CR DG CHEST 1V PORT
1 series · 1 of 1 positions shown · non-contrast
Comparison: 06/03/2011 and earlier.

CLINICAL DATA: 55-year-old male with hypertension, seizure,
dialysis catheter.

PORTABLE CHEST - 1 VIEW

[view not recorded]
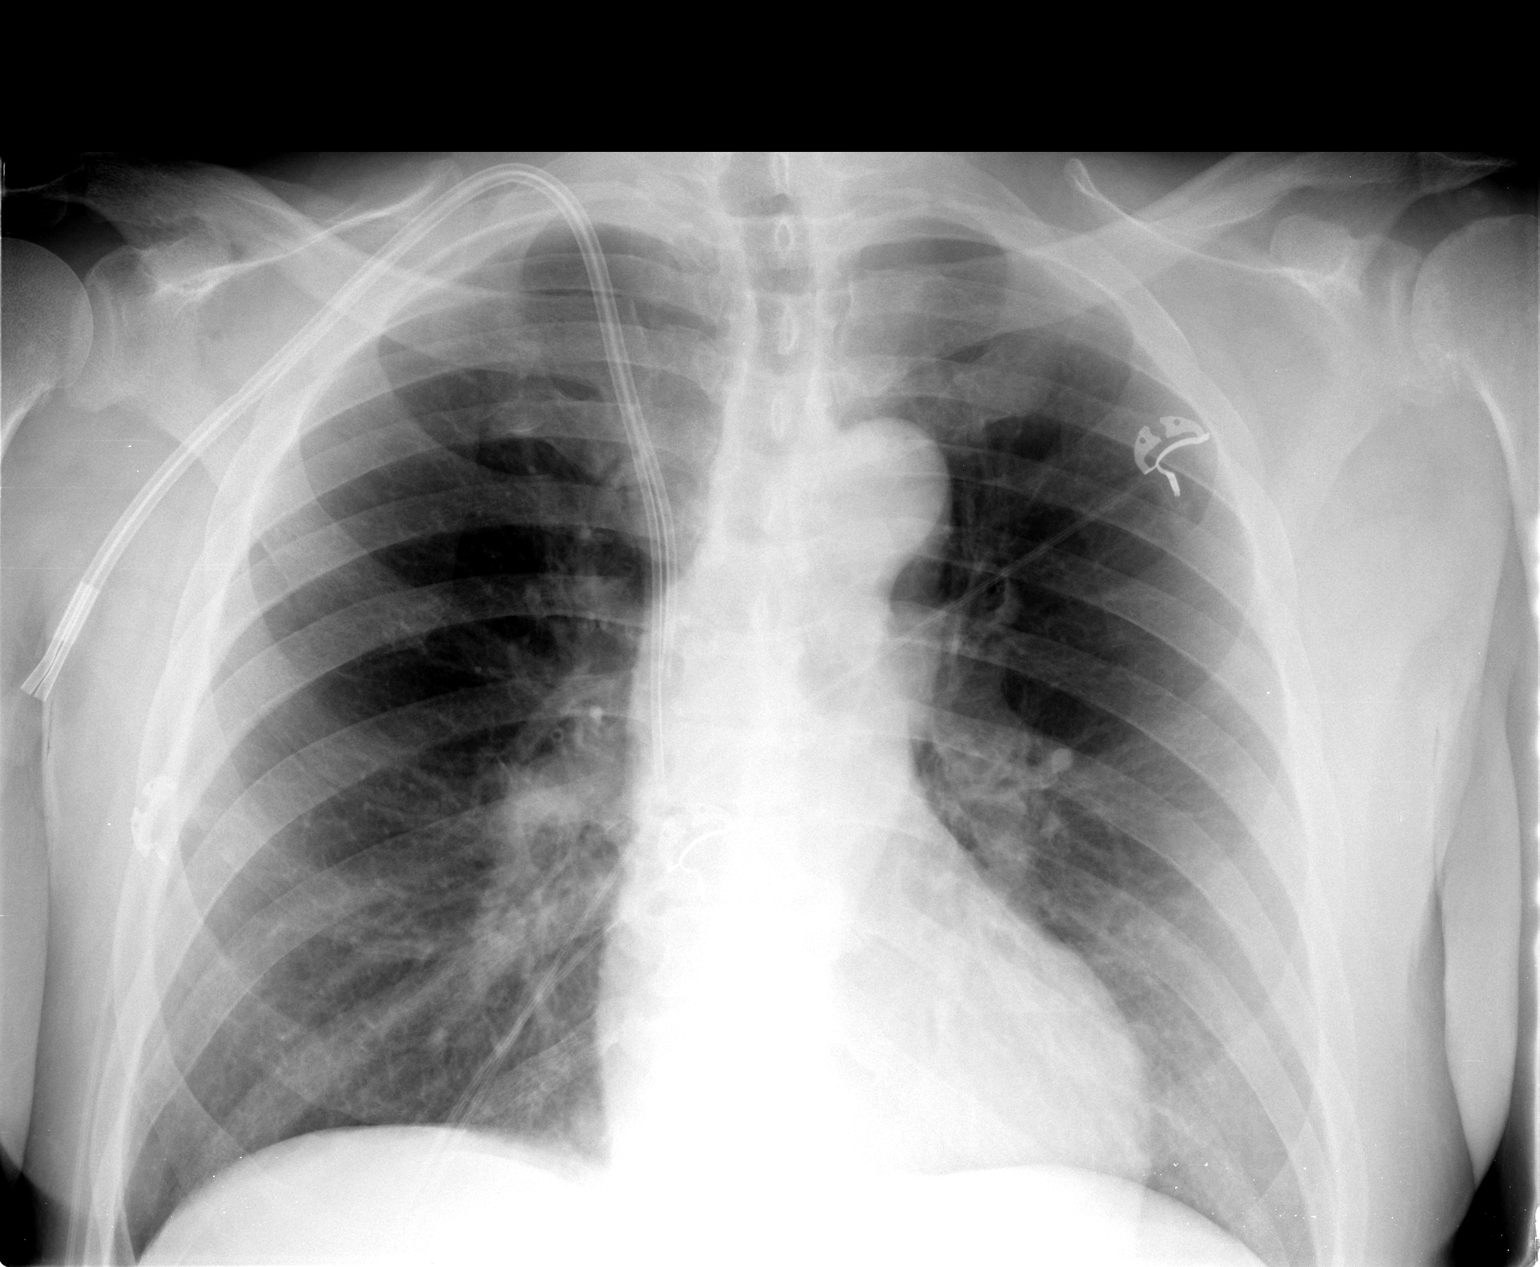

[1 of 1 positions shown; findings below may reference images not displayed]

FINDINGS: Upright portable AP view at 8058 hours.  Stable right IJ
approach dual lumen dialysis catheter.  Larger lung volumes.
Decreased perihilar interstitial opacity.  No pneumothorax, pleural
effusion or new pulmonary opacity.  Cardiac size and mediastinal
contours are within normal limits.
IMPRESSION: Decreased / resolved perihilar edema.  No new cardiopulmonary
abnormality.

## 2012-11-29 DIAGNOSIS — D509 Iron deficiency anemia, unspecified: Secondary | ICD-10-CM | POA: Diagnosis not present

## 2012-11-29 DIAGNOSIS — N186 End stage renal disease: Secondary | ICD-10-CM | POA: Diagnosis not present

## 2012-11-30 DIAGNOSIS — N186 End stage renal disease: Secondary | ICD-10-CM | POA: Diagnosis not present

## 2012-11-30 DIAGNOSIS — D509 Iron deficiency anemia, unspecified: Secondary | ICD-10-CM | POA: Diagnosis not present

## 2012-12-01 DIAGNOSIS — N186 End stage renal disease: Secondary | ICD-10-CM | POA: Diagnosis not present

## 2012-12-01 DIAGNOSIS — D509 Iron deficiency anemia, unspecified: Secondary | ICD-10-CM | POA: Diagnosis not present

## 2012-12-02 DIAGNOSIS — D509 Iron deficiency anemia, unspecified: Secondary | ICD-10-CM | POA: Diagnosis not present

## 2012-12-02 DIAGNOSIS — N186 End stage renal disease: Secondary | ICD-10-CM | POA: Diagnosis not present

## 2012-12-03 DIAGNOSIS — N186 End stage renal disease: Secondary | ICD-10-CM | POA: Diagnosis not present

## 2012-12-03 DIAGNOSIS — D509 Iron deficiency anemia, unspecified: Secondary | ICD-10-CM | POA: Diagnosis not present

## 2012-12-05 DIAGNOSIS — D509 Iron deficiency anemia, unspecified: Secondary | ICD-10-CM | POA: Diagnosis not present

## 2012-12-05 DIAGNOSIS — N186 End stage renal disease: Secondary | ICD-10-CM | POA: Diagnosis not present

## 2012-12-06 DIAGNOSIS — D509 Iron deficiency anemia, unspecified: Secondary | ICD-10-CM | POA: Diagnosis not present

## 2012-12-06 DIAGNOSIS — N186 End stage renal disease: Secondary | ICD-10-CM | POA: Diagnosis not present

## 2012-12-06 IMAGING — CR DG CHEST 2V
2 series · 2 of 2 positions shown · non-contrast
Comparison: Portable chest x-ray 06/06/2011 dating back to
06/02/2011.  Two-view chest x-ray 11/03/2006.

CLINICAL DATA: Cough.  Smoker.  End-stage renal disease on
hemodialysis.

CHEST - 2 VIEW 06/14/2011:

[w chest pa]
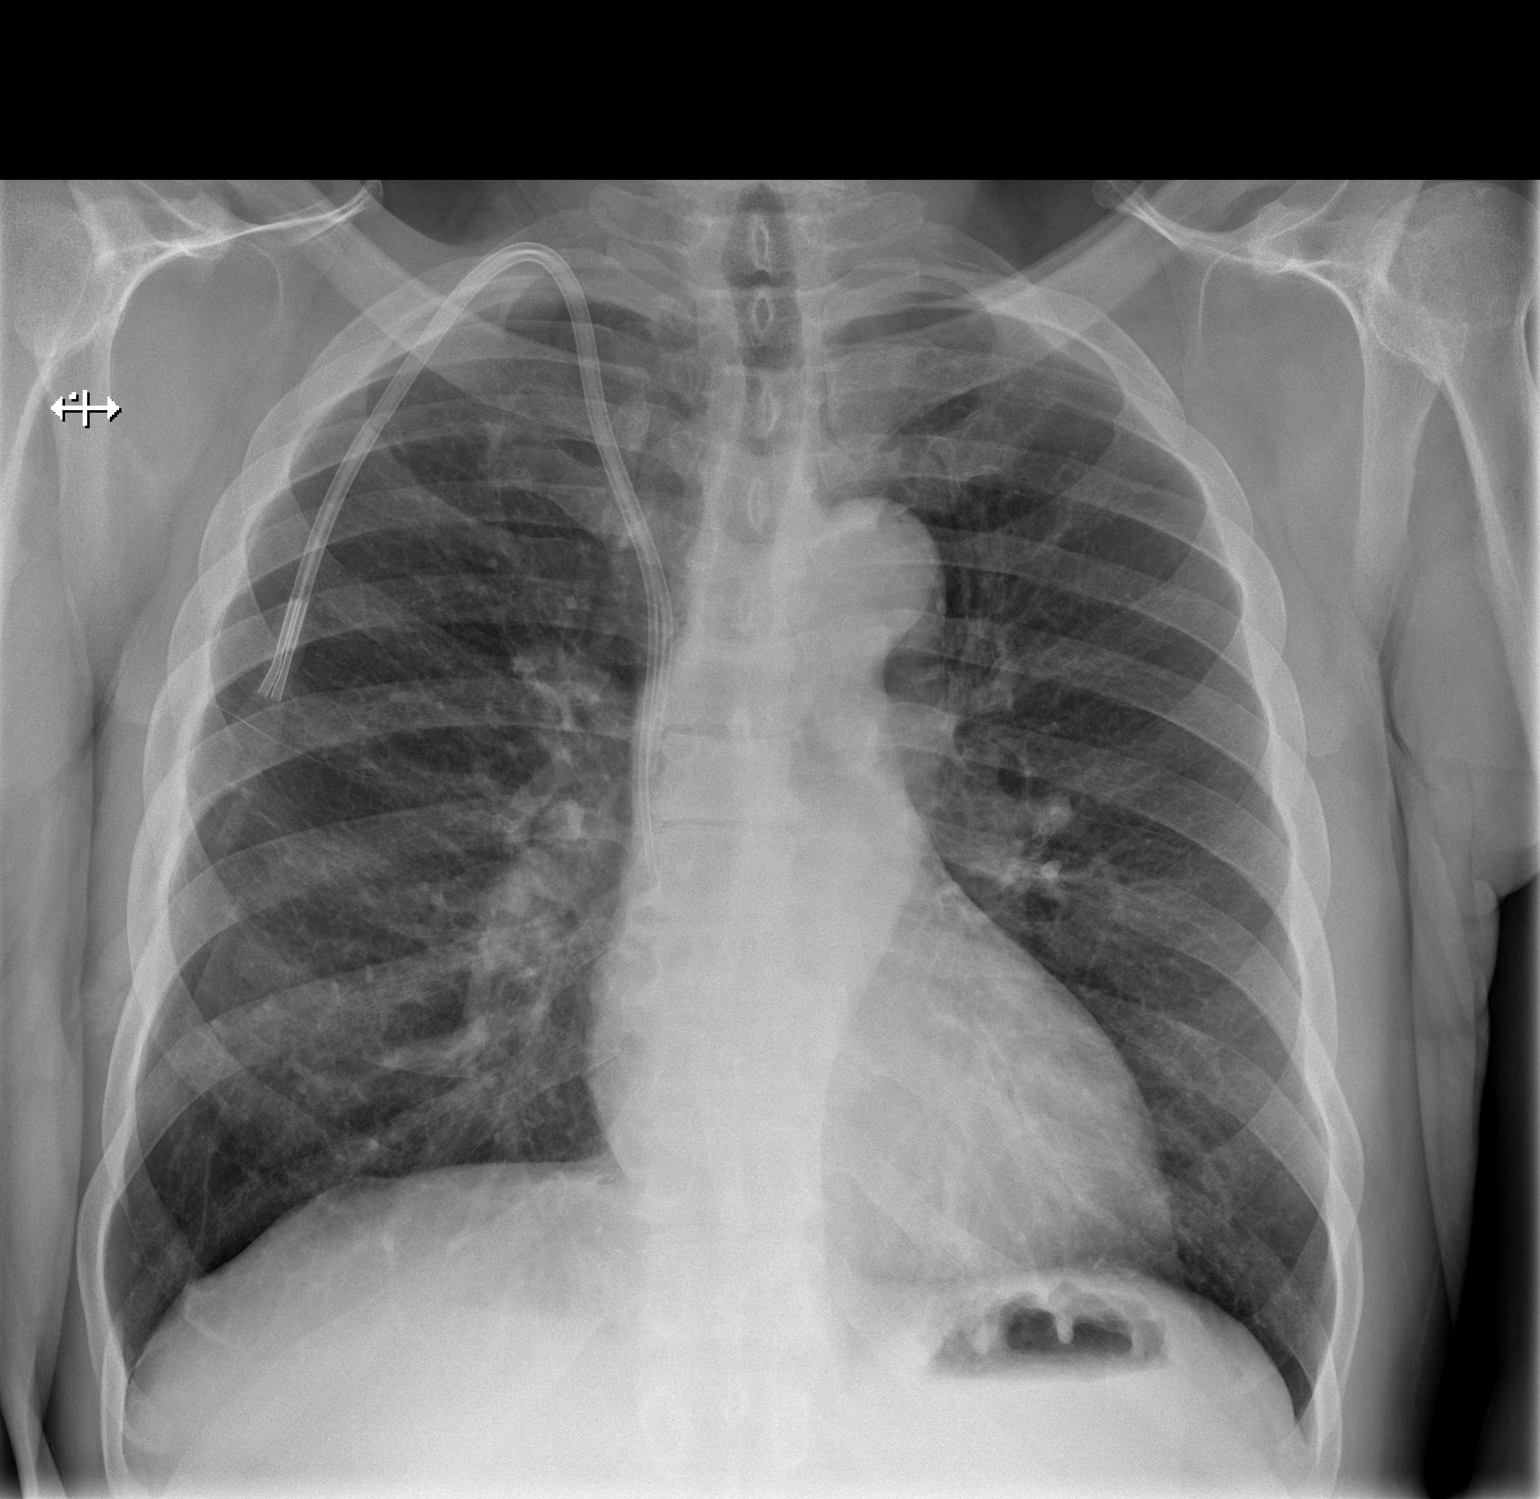

[w chest lat]
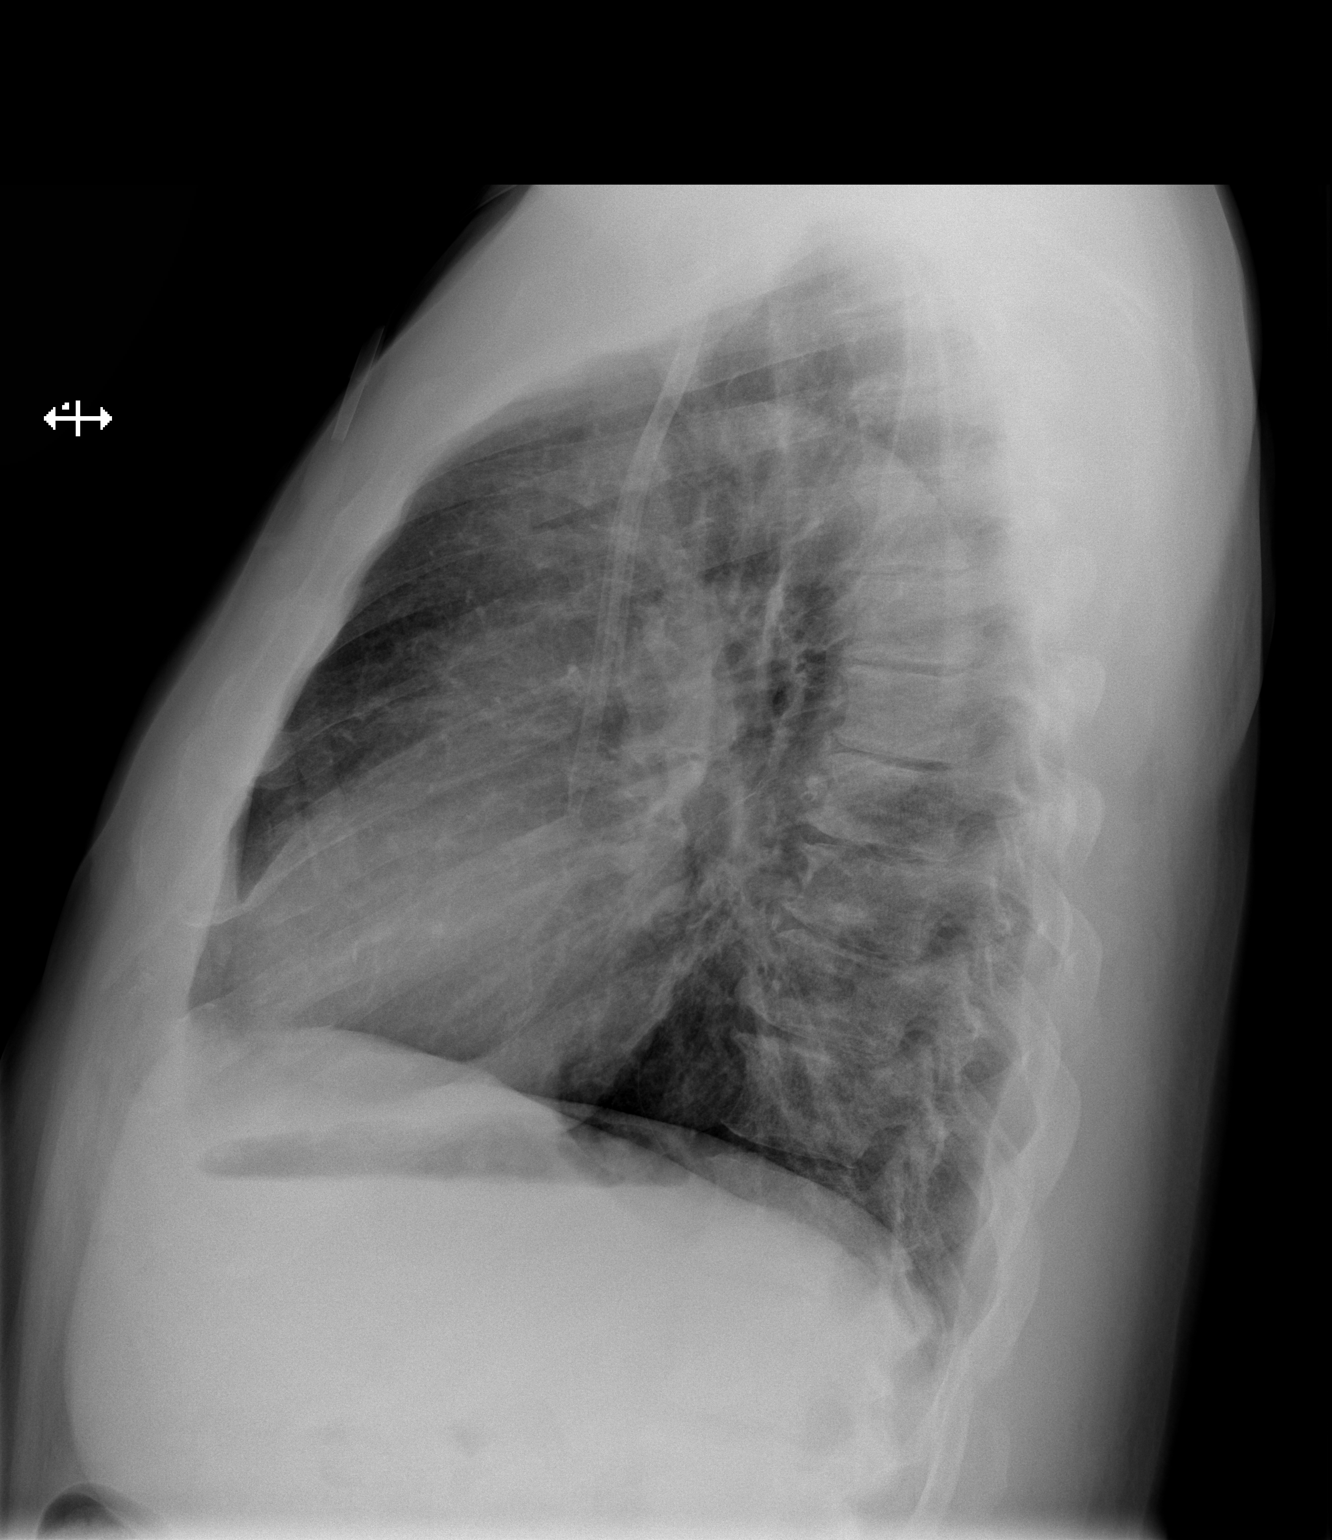

[2 of 2 positions shown; findings below may reference images not displayed]

FINDINGS: Right jugular dialysis catheter tips in the SVC,
unchanged.  Cardiac silhouette mildly enlarged but stable.
Thoracic aorta tortuous and atherosclerotic, unchanged.  Hilar and
mediastinal contours otherwise unremarkable.  Lungs mildly
hyperinflated but clear.  Scarring in the right lower lobe,
unchanged.  No pleural effusions.  Degenerative changes involving
the thoracic spine.
IMPRESSION: COPD/emphysema.  Scarring in the right lower lobe.  No acute
cardiopulmonary disease.  Stable mild cardiomegaly without
pulmonary edema.

## 2012-12-08 DIAGNOSIS — N186 End stage renal disease: Secondary | ICD-10-CM | POA: Diagnosis not present

## 2012-12-08 DIAGNOSIS — D509 Iron deficiency anemia, unspecified: Secondary | ICD-10-CM | POA: Diagnosis not present

## 2012-12-09 DIAGNOSIS — D509 Iron deficiency anemia, unspecified: Secondary | ICD-10-CM | POA: Diagnosis not present

## 2012-12-09 DIAGNOSIS — N186 End stage renal disease: Secondary | ICD-10-CM | POA: Diagnosis not present

## 2012-12-11 DIAGNOSIS — D509 Iron deficiency anemia, unspecified: Secondary | ICD-10-CM | POA: Diagnosis not present

## 2012-12-11 DIAGNOSIS — N186 End stage renal disease: Secondary | ICD-10-CM | POA: Diagnosis not present

## 2012-12-13 DIAGNOSIS — N186 End stage renal disease: Secondary | ICD-10-CM | POA: Diagnosis not present

## 2012-12-13 DIAGNOSIS — D509 Iron deficiency anemia, unspecified: Secondary | ICD-10-CM | POA: Diagnosis not present

## 2012-12-15 DIAGNOSIS — N186 End stage renal disease: Secondary | ICD-10-CM | POA: Diagnosis not present

## 2012-12-15 DIAGNOSIS — D509 Iron deficiency anemia, unspecified: Secondary | ICD-10-CM | POA: Diagnosis not present

## 2012-12-17 DIAGNOSIS — N186 End stage renal disease: Secondary | ICD-10-CM | POA: Diagnosis not present

## 2012-12-17 DIAGNOSIS — D509 Iron deficiency anemia, unspecified: Secondary | ICD-10-CM | POA: Diagnosis not present

## 2012-12-19 DIAGNOSIS — D509 Iron deficiency anemia, unspecified: Secondary | ICD-10-CM | POA: Diagnosis not present

## 2012-12-19 DIAGNOSIS — N186 End stage renal disease: Secondary | ICD-10-CM | POA: Diagnosis not present

## 2012-12-20 ENCOUNTER — Inpatient Hospital Stay (HOSPITAL_COMMUNITY)
Admission: EM | Admit: 2012-12-20 | Discharge: 2012-12-20 | DRG: 640 | Disposition: A | Discharge status: Home / Self Care | Payer: Medicare Other | Attending: Emergency Medicine | Admitting: Emergency Medicine

## 2012-12-20 ENCOUNTER — Encounter (HOSPITAL_COMMUNITY): Payer: Self-pay | Admitting: *Deleted

## 2012-12-20 ENCOUNTER — Emergency Department (HOSPITAL_COMMUNITY): Payer: Medicare Other

## 2012-12-20 DIAGNOSIS — D631 Anemia in chronic kidney disease: Secondary | ICD-10-CM | POA: Diagnosis not present

## 2012-12-20 DIAGNOSIS — E8770 Fluid overload, unspecified: Secondary | ICD-10-CM

## 2012-12-20 DIAGNOSIS — E8779 Other fluid overload: Secondary | ICD-10-CM | POA: Diagnosis present

## 2012-12-20 DIAGNOSIS — I120 Hypertensive chronic kidney disease with stage 5 chronic kidney disease or end stage renal disease: Secondary | ICD-10-CM | POA: Diagnosis not present

## 2012-12-20 DIAGNOSIS — Z992 Dependence on renal dialysis: Secondary | ICD-10-CM

## 2012-12-20 DIAGNOSIS — N186 End stage renal disease: Secondary | ICD-10-CM | POA: Diagnosis not present

## 2012-12-20 DIAGNOSIS — Z21 Asymptomatic human immunodeficiency virus [HIV] infection status: Secondary | ICD-10-CM | POA: Diagnosis present

## 2012-12-20 DIAGNOSIS — E875 Hyperkalemia: Secondary | ICD-10-CM | POA: Diagnosis not present

## 2012-12-20 DIAGNOSIS — E869 Volume depletion, unspecified: Secondary | ICD-10-CM | POA: Diagnosis not present

## 2012-12-20 DIAGNOSIS — J9819 Other pulmonary collapse: Secondary | ICD-10-CM | POA: Diagnosis not present

## 2012-12-20 LAB — COMPREHENSIVE METABOLIC PANEL
ALT: 10 U/L (ref 0–53)
Albumin: 4.4 g/dL (ref 3.5–5.2)
BUN: 124 mg/dL — ABNORMAL HIGH (ref 6–23)
Calcium: 9.8 mg/dL (ref 8.4–10.5)
GFR calc Af Amer: 2 mL/min — ABNORMAL LOW (ref >90)
Glucose, Bld: 76 mg/dL (ref 70–99)
Sodium: 136 mEq/L (ref 135–145)
Total Protein: 10.1 g/dL — ABNORMAL HIGH (ref 6.0–8.3)

## 2012-12-20 LAB — CBC WITH DIFFERENTIAL/PLATELET
HCT: 31.2 % — ABNORMAL LOW (ref 39.0–52.0)
Hemoglobin: 10.4 g/dL — ABNORMAL LOW (ref 13.0–17.0)
Lymphocytes Relative: 26 % (ref 12–46)
Monocytes Absolute: 0.3 10*3/uL (ref 0.1–1.0)
Monocytes Relative: 6 % (ref 3–12)
Neutro Abs: 3.5 10*3/uL (ref 1.7–7.7)
WBC: 5.5 10*3/uL (ref 4.0–10.5)

## 2012-12-20 NOTE — Procedures (Signed)
Pt seen on HD.  SBP 174.  Will try for 4 liters and use the weight after HD as his new DW.
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
## 2012-12-20 NOTE — Progress Notes (Signed)
Hemodialysis treatment complete without adverse events.  Vital signs stable and pt is currently without complaint.  Pt is being discharged to home.  Family member is present to receive patient.
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
## 2012-12-21 DIAGNOSIS — N186 End stage renal disease: Secondary | ICD-10-CM | POA: Diagnosis not present

## 2012-12-22 NOTE — ED Provider Notes (Signed)
Medical screening examination/treatment/procedure(s) were performed by non-physician practitioner and as supervising physician I was immediately available for consultation/collaboration.   Kathalene Frames, MD 12/22/12 [phone]

## 2013-01-04 ENCOUNTER — Other Ambulatory Visit: Payer: Self-pay | Admitting: *Deleted

## 2013-01-04 DIAGNOSIS — N186 End stage renal disease: Secondary | ICD-10-CM

## 2013-01-04 MED ORDER — CARVEDILOL 12.5 MG PO TABS: 12.5000 mg | ORAL_TABLET | Freq: Two times a day (BID) | ORAL | Status: DC

## 2013-01-08 ENCOUNTER — Inpatient Hospital Stay (HOSPITAL_COMMUNITY)
Admission: EM | Admit: 2013-01-08 | Discharge: 2013-01-10 | DRG: 640 | Disposition: A | Discharge status: Home / Self Care | Payer: Medicare Other | Attending: Internal Medicine | Admitting: Internal Medicine

## 2013-01-08 ENCOUNTER — Emergency Department (HOSPITAL_COMMUNITY): Payer: Medicare Other

## 2013-01-08 ENCOUNTER — Encounter (HOSPITAL_COMMUNITY): Payer: Self-pay | Admitting: *Deleted

## 2013-01-08 DIAGNOSIS — J90 Pleural effusion, not elsewhere classified: Secondary | ICD-10-CM | POA: Diagnosis present

## 2013-01-08 DIAGNOSIS — D631 Anemia in chronic kidney disease: Secondary | ICD-10-CM | POA: Diagnosis present

## 2013-01-08 DIAGNOSIS — Z21 Asymptomatic human immunodeficiency virus [HIV] infection status: Secondary | ICD-10-CM

## 2013-01-08 DIAGNOSIS — J811 Chronic pulmonary edema: Secondary | ICD-10-CM | POA: Diagnosis present

## 2013-01-08 DIAGNOSIS — B20 Human immunodeficiency virus [HIV] disease: Secondary | ICD-10-CM

## 2013-01-08 DIAGNOSIS — J9819 Other pulmonary collapse: Secondary | ICD-10-CM | POA: Diagnosis not present

## 2013-01-08 DIAGNOSIS — I120 Hypertensive chronic kidney disease with stage 5 chronic kidney disease or end stage renal disease: Secondary | ICD-10-CM | POA: Diagnosis not present

## 2013-01-08 DIAGNOSIS — J810 Acute pulmonary edema: Secondary | ICD-10-CM

## 2013-01-08 DIAGNOSIS — F411 Generalized anxiety disorder: Secondary | ICD-10-CM | POA: Diagnosis present

## 2013-01-08 DIAGNOSIS — R0602 Shortness of breath: Secondary | ICD-10-CM

## 2013-01-08 DIAGNOSIS — I10 Essential (primary) hypertension: Secondary | ICD-10-CM | POA: Diagnosis not present

## 2013-01-08 DIAGNOSIS — E871 Hypo-osmolality and hyponatremia: Secondary | ICD-10-CM | POA: Diagnosis present

## 2013-01-08 DIAGNOSIS — E875 Hyperkalemia: Secondary | ICD-10-CM | POA: Diagnosis present

## 2013-01-08 DIAGNOSIS — Z79899 Other long term (current) drug therapy: Secondary | ICD-10-CM | POA: Diagnosis not present

## 2013-01-08 DIAGNOSIS — N2581 Secondary hyperparathyroidism of renal origin: Secondary | ICD-10-CM | POA: Diagnosis present

## 2013-01-08 DIAGNOSIS — F419 Anxiety disorder, unspecified: Secondary | ICD-10-CM | POA: Diagnosis present

## 2013-01-08 DIAGNOSIS — B181 Chronic viral hepatitis B without delta-agent: Secondary | ICD-10-CM | POA: Diagnosis not present

## 2013-01-08 DIAGNOSIS — E8779 Other fluid overload: Secondary | ICD-10-CM | POA: Diagnosis not present

## 2013-01-08 DIAGNOSIS — J80 Acute respiratory distress syndrome: Secondary | ICD-10-CM

## 2013-01-08 DIAGNOSIS — Z992 Dependence on renal dialysis: Secondary | ICD-10-CM

## 2013-01-08 DIAGNOSIS — B1910 Unspecified viral hepatitis B without hepatic coma: Secondary | ICD-10-CM | POA: Diagnosis present

## 2013-01-08 DIAGNOSIS — M949 Disorder of cartilage, unspecified: Secondary | ICD-10-CM | POA: Diagnosis present

## 2013-01-08 DIAGNOSIS — D696 Thrombocytopenia, unspecified: Secondary | ICD-10-CM

## 2013-01-08 DIAGNOSIS — F17200 Nicotine dependence, unspecified, uncomplicated: Secondary | ICD-10-CM | POA: Diagnosis present

## 2013-01-08 DIAGNOSIS — Z720 Tobacco use: Secondary | ICD-10-CM | POA: Diagnosis present

## 2013-01-08 DIAGNOSIS — D649 Anemia, unspecified: Secondary | ICD-10-CM

## 2013-01-08 DIAGNOSIS — J9589 Other postprocedural complications and disorders of respiratory system, not elsewhere classified: Secondary | ICD-10-CM | POA: Diagnosis not present

## 2013-01-08 DIAGNOSIS — N186 End stage renal disease: Secondary | ICD-10-CM | POA: Diagnosis present

## 2013-01-08 DIAGNOSIS — J984 Other disorders of lung: Secondary | ICD-10-CM | POA: Diagnosis not present

## 2013-01-08 DIAGNOSIS — M899 Disorder of bone, unspecified: Secondary | ICD-10-CM | POA: Diagnosis present

## 2013-01-08 DIAGNOSIS — J9801 Acute bronchospasm: Secondary | ICD-10-CM

## 2013-01-08 DIAGNOSIS — E8770 Fluid overload, unspecified: Secondary | ICD-10-CM

## 2013-01-08 DIAGNOSIS — I160 Hypertensive urgency: Secondary | ICD-10-CM

## 2013-01-08 HISTORY — DX: End stage renal disease: N18.6

## 2013-01-08 LAB — COMPREHENSIVE METABOLIC PANEL
ALT: 8 U/L (ref 0–53)
CO2: 27 mEq/L (ref 19–32)
Calcium: 9.9 mg/dL (ref 8.4–10.5)
GFR calc Af Amer: 3 mL/min — ABNORMAL LOW (ref >90)
GFR calc non Af Amer: 3 mL/min — ABNORMAL LOW (ref >90)
Glucose, Bld: 94 mg/dL (ref 70–99)
Sodium: 125 mEq/L — ABNORMAL LOW (ref 135–145)

## 2013-01-08 LAB — CBC WITH DIFFERENTIAL/PLATELET
Eosinophils Relative: 7 % — ABNORMAL HIGH (ref 0–5)
HCT: 20.8 % — ABNORMAL LOW (ref 39.0–52.0)
Hemoglobin: 7.3 g/dL — ABNORMAL LOW (ref 13.0–17.0)
Lymphocytes Relative: 20 % (ref 12–46)
Lymphs Abs: 1.6 10*3/uL (ref 0.7–4.0)
MCV: 79.7 fL (ref 78.0–100.0)
Monocytes Absolute: 0.7 10*3/uL (ref 0.1–1.0)
Monocytes Relative: 9 % (ref 3–12)
Platelets: 86 10*3/uL — ABNORMAL LOW (ref 150–400)
RBC: 2.61 MIL/uL — ABNORMAL LOW (ref 4.22–5.81)
WBC: 7.8 10*3/uL (ref 4.0–10.5)

## 2013-01-08 NOTE — ED Notes (Signed)
The pt is a dialysis pt that was dialyzed yesterday.  Fistula rt lower arm.  He is home dialysis.  He has been sob since yesterday with a cough no temp.  He wants to be checked for pneumonia.  Chest pain when he coughs.  No distress

## 2013-01-09 ENCOUNTER — Inpatient Hospital Stay (HOSPITAL_COMMUNITY): Payer: Medicare Other

## 2013-01-09 ENCOUNTER — Encounter: Payer: Self-pay | Admitting: Internal Medicine

## 2013-01-09 ENCOUNTER — Encounter (HOSPITAL_COMMUNITY): Payer: Self-pay | Admitting: Internal Medicine

## 2013-01-09 DIAGNOSIS — J9589 Other postprocedural complications and disorders of respiratory system, not elsewhere classified: Secondary | ICD-10-CM

## 2013-01-09 DIAGNOSIS — R0602 Shortness of breath: Secondary | ICD-10-CM | POA: Diagnosis not present

## 2013-01-09 DIAGNOSIS — N186 End stage renal disease: Secondary | ICD-10-CM | POA: Diagnosis not present

## 2013-01-09 DIAGNOSIS — J811 Chronic pulmonary edema: Secondary | ICD-10-CM | POA: Diagnosis not present

## 2013-01-09 DIAGNOSIS — D696 Thrombocytopenia, unspecified: Secondary | ICD-10-CM

## 2013-01-09 DIAGNOSIS — J9819 Other pulmonary collapse: Secondary | ICD-10-CM | POA: Diagnosis not present

## 2013-01-09 DIAGNOSIS — D649 Anemia, unspecified: Secondary | ICD-10-CM

## 2013-01-09 DIAGNOSIS — Z720 Tobacco use: Secondary | ICD-10-CM | POA: Diagnosis present

## 2013-01-09 DIAGNOSIS — I10 Essential (primary) hypertension: Secondary | ICD-10-CM | POA: Diagnosis not present

## 2013-01-09 DIAGNOSIS — E875 Hyperkalemia: Secondary | ICD-10-CM | POA: Diagnosis present

## 2013-01-09 DIAGNOSIS — B1910 Unspecified viral hepatitis B without hepatic coma: Secondary | ICD-10-CM | POA: Diagnosis present

## 2013-01-09 DIAGNOSIS — E8779 Other fluid overload: Principal | ICD-10-CM

## 2013-01-09 DIAGNOSIS — E871 Hypo-osmolality and hyponatremia: Secondary | ICD-10-CM | POA: Diagnosis present

## 2013-01-09 LAB — RENAL FUNCTION PANEL
Calcium: 9.7 mg/dL (ref 8.4–10.5)
Creatinine, Ser: 17.04 mg/dL — ABNORMAL HIGH (ref 0.50–1.35)
GFR calc Af Amer: 3 mL/min — ABNORMAL LOW (ref >90)
GFR calc non Af Amer: 3 mL/min — ABNORMAL LOW (ref >90)
Phosphorus: 1.4 mg/dL — ABNORMAL LOW (ref 2.3–4.6)
Sodium: 125 mEq/L — ABNORMAL LOW (ref 135–145)

## 2013-01-09 LAB — CBC
HCT: 18.7 % — ABNORMAL LOW (ref 39.0–52.0)
HCT: 17.8 % — ABNORMAL LOW (ref 39.0–52.0)
Hemoglobin: 6.4 g/dL — CL (ref 13.0–17.0)
MCH: 28 pg (ref 26.0–34.0)
MCHC: 35.8 g/dL (ref 30.0–36.0)
MCV: 77.7 fL — ABNORMAL LOW (ref 78.0–100.0)
RDW: 17.2 % — ABNORMAL HIGH (ref 11.5–15.5)
RDW: 17.2 % — ABNORMAL HIGH (ref 11.5–15.5)
WBC: 6.3 10*3/uL (ref 4.0–10.5)

## 2013-01-09 LAB — PREPARE RBC (CROSSMATCH)

## 2013-01-09 LAB — PROTIME-INR: INR: 1.17 (ref 0.00–1.49)

## 2013-01-09 MED ORDER — ALPRAZOLAM 0.5 MG PO TABS
ORAL_TABLET | ORAL | Status: AC
  Administered 2013-01-09: 0.5 mg via ORAL
  Filled 2013-01-09: qty 1

## 2013-01-09 MED ORDER — NEPRO/CARBSTEADY PO LIQD
237.0000 mL | ORAL | Status: DC | PRN
  Filled 2013-01-09: qty 237

## 2013-01-09 MED ORDER — HEPARIN SODIUM (PORCINE) 1000 UNIT/ML DIALYSIS: 1000.0000 [IU] | INTRAMUSCULAR | Status: DC | PRN

## 2013-01-09 MED ORDER — ALBUTEROL SULFATE (5 MG/ML) 0.5% IN NEBU
2.5000 mg | INHALATION_SOLUTION | Freq: Four times a day (QID) | RESPIRATORY_TRACT | Status: DC
  Administered 2013-01-09: 2.5 mg via RESPIRATORY_TRACT
  Filled 2013-01-09: qty 0.5

## 2013-01-09 MED ORDER — TENOFOVIR DISOPROXIL FUMARATE 300 MG PO TABS: 300.0000 mg | ORAL_TABLET | ORAL | Status: DC

## 2013-01-09 MED ORDER — CALCITRIOL 0.25 MCG PO CAPS
0.2500 ug | ORAL_CAPSULE | Freq: Two times a day (BID) | ORAL | Status: DC
  Administered 2013-01-09 – 2013-01-10 (×3): 0.25 ug via ORAL
  Filled 2013-01-09 (×4): qty 1

## 2013-01-09 MED ORDER — SODIUM CHLORIDE 0.9 % IJ SOLN: 3.0000 mL | INTRAMUSCULAR | Status: DC | PRN

## 2013-01-09 MED ORDER — ACETAMINOPHEN 325 MG PO TABS: 650.0000 mg | ORAL_TABLET | Freq: Four times a day (QID) | ORAL | Status: DC | PRN

## 2013-01-09 MED ORDER — IPRATROPIUM BROMIDE 0.02 % IN SOLN
0.5000 mg | Freq: Once | RESPIRATORY_TRACT | Status: AC
  Administered 2013-01-09: 0.5 mg via RESPIRATORY_TRACT
  Filled 2013-01-09: qty 2.5

## 2013-01-09 MED ORDER — CALCIUM ACETATE 667 MG PO TABS: 1334.0000 mg | ORAL_TABLET | Freq: Three times a day (TID) | ORAL | Status: DC

## 2013-01-09 MED ORDER — CINACALCET HCL 30 MG PO TABS
30.0000 mg | ORAL_TABLET | Freq: Every day | ORAL | Status: DC
  Administered 2013-01-09 – 2013-01-10 (×2): 30 mg via ORAL
  Filled 2013-01-09 (×3): qty 1

## 2013-01-09 MED ORDER — CALCIUM ACETATE 667 MG PO CAPS
1334.0000 mg | ORAL_CAPSULE | ORAL | Status: DC | PRN
  Filled 2013-01-09: qty 2

## 2013-01-09 MED ORDER — RENA-VITE PO TABS
1.0000 | ORAL_TABLET | Freq: Every day | ORAL | Status: DC
  Administered 2013-01-09: 1 via ORAL
  Filled 2013-01-09 (×2): qty 1

## 2013-01-09 MED ORDER — ALBUTEROL SULFATE HFA 108 (90 BASE) MCG/ACT IN AERS: 2.0000 | INHALATION_SPRAY | RESPIRATORY_TRACT | Status: DC | PRN

## 2013-01-09 MED ORDER — IPRATROPIUM BROMIDE 0.02 % IN SOLN
0.5000 mg | RESPIRATORY_TRACT | Status: DC
  Administered 2013-01-09: 0.5 mg via RESPIRATORY_TRACT
  Filled 2013-01-09: qty 2.5

## 2013-01-09 MED ORDER — ACETAMINOPHEN 650 MG RE SUPP: 650.0000 mg | Freq: Four times a day (QID) | RECTAL | Status: DC | PRN

## 2013-01-09 MED ORDER — SODIUM GLYCEROPHOSPHATE 1 MMOLE/ML IV SOLN
20.0000 mmol | Freq: Once | INTRAVENOUS | Status: AC
  Administered 2013-01-09: 20 mmol via INTRAVENOUS
  Filled 2013-01-09: qty 20

## 2013-01-09 MED ORDER — POLYETHYLENE GLYCOL 3350 17 G PO PACK
17.0000 g | PACK | Freq: Every day | ORAL | Status: DC
  Administered 2013-01-09 – 2013-01-10 (×2): 17 g via ORAL
  Filled 2013-01-09 (×2): qty 1

## 2013-01-09 MED ORDER — HYDRALAZINE HCL 25 MG PO TABS
25.0000 mg | ORAL_TABLET | Freq: Three times a day (TID) | ORAL | Status: DC
  Administered 2013-01-09 – 2013-01-10 (×4): 25 mg via ORAL
  Filled 2013-01-09 (×8): qty 1

## 2013-01-09 MED ORDER — RALTEGRAVIR POTASSIUM 400 MG PO TABS
400.0000 mg | ORAL_TABLET | Freq: Two times a day (BID) | ORAL | Status: DC
  Administered 2013-01-09 – 2013-01-10 (×3): 400 mg via ORAL
  Filled 2013-01-09 (×4): qty 1

## 2013-01-09 MED ORDER — TRAZODONE HCL 50 MG PO TABS
50.0000 mg | ORAL_TABLET | Freq: Every day | ORAL | Status: DC
  Administered 2013-01-09: 50 mg via ORAL
  Filled 2013-01-09 (×2): qty 1

## 2013-01-09 MED ORDER — SODIUM CHLORIDE 0.9 % IJ SOLN
3.0000 mL | Freq: Two times a day (BID) | INTRAMUSCULAR | Status: DC
  Administered 2013-01-09: 3 mL via INTRAVENOUS

## 2013-01-09 MED ORDER — HYDROCODONE-ACETAMINOPHEN 5-325 MG PO TABS
1.0000 | ORAL_TABLET | Freq: Once | ORAL | Status: AC
  Administered 2013-01-09: 2 via ORAL
  Filled 2013-01-09: qty 2

## 2013-01-09 MED ORDER — HYDRALAZINE HCL 20 MG/ML IJ SOLN
10.0000 mg | Freq: Once | INTRAMUSCULAR | Status: AC
  Administered 2013-01-09: 10 mg via INTRAVENOUS
  Filled 2013-01-09: qty 1

## 2013-01-09 MED ORDER — LIDOCAINE-PRILOCAINE 2.5-2.5 % EX CREA: 1.0000 "application " | TOPICAL_CREAM | CUTANEOUS | Status: DC | PRN

## 2013-01-09 MED ORDER — IPRATROPIUM BROMIDE 0.02 % IN SOLN
0.5000 mg | Freq: Two times a day (BID) | RESPIRATORY_TRACT | Status: DC
  Administered 2013-01-09: 0.5 mg via RESPIRATORY_TRACT
  Filled 2013-01-09 (×2): qty 2.5

## 2013-01-09 MED ORDER — CARVEDILOL 12.5 MG PO TABS
12.5000 mg | ORAL_TABLET | Freq: Two times a day (BID) | ORAL | Status: DC
  Administered 2013-01-09 – 2013-01-10 (×4): 12.5 mg via ORAL
  Filled 2013-01-09 (×6): qty 1

## 2013-01-09 MED ORDER — AMLODIPINE BESYLATE 10 MG PO TABS
10.0000 mg | ORAL_TABLET | Freq: Two times a day (BID) | ORAL | Status: DC
  Administered 2013-01-09 – 2013-01-10 (×3): 10 mg via ORAL
  Filled 2013-01-09 (×5): qty 1

## 2013-01-09 MED ORDER — AMLODIPINE BESYLATE 10 MG PO TABS
10.0000 mg | ORAL_TABLET | Freq: Once | ORAL | Status: AC
  Administered 2013-01-09: 10 mg via ORAL
  Filled 2013-01-09: qty 1

## 2013-01-09 MED ORDER — ALBUTEROL SULFATE (5 MG/ML) 0.5% IN NEBU
5.0000 mg | INHALATION_SOLUTION | Freq: Once | RESPIRATORY_TRACT | Status: AC
  Administered 2013-01-09: 5 mg via RESPIRATORY_TRACT
  Filled 2013-01-09: qty 1

## 2013-01-09 MED ORDER — SODIUM CHLORIDE 0.9 % IV SOLN: 250.0000 mL | INTRAVENOUS | Status: DC | PRN

## 2013-01-09 MED ORDER — SODIUM CHLORIDE 0.9 % IV SOLN: 100.0000 mL | INTRAVENOUS | Status: DC | PRN

## 2013-01-09 MED ORDER — CALCIUM ACETATE 667 MG PO CAPS
2001.0000 mg | ORAL_CAPSULE | Freq: Three times a day (TID) | ORAL | Status: DC
  Administered 2013-01-09: 2001 mg via ORAL
  Filled 2013-01-09 (×4): qty 3

## 2013-01-09 MED ORDER — ALBUTEROL SULFATE (5 MG/ML) 0.5% IN NEBU
2.5000 mg | INHALATION_SOLUTION | Freq: Two times a day (BID) | RESPIRATORY_TRACT | Status: DC
  Administered 2013-01-09: 2.5 mg via RESPIRATORY_TRACT
  Filled 2013-01-09 (×2): qty 0.5

## 2013-01-09 MED ORDER — LIDOCAINE HCL (PF) 1 % IJ SOLN: 5.0000 mL | INTRAMUSCULAR | Status: DC | PRN

## 2013-01-09 MED ORDER — DARBEPOETIN ALFA-POLYSORBATE 150 MCG/0.3ML IJ SOLN: 150.0000 ug | Freq: Once | INTRAMUSCULAR | Status: AC

## 2013-01-09 MED ORDER — LAMIVUDINE 10 MG/ML PO SOLN
25.0000 mg | Freq: Every day | ORAL | Status: DC
  Administered 2013-01-09 – 2013-01-10 (×2): 25 mg via ORAL
  Filled 2013-01-09 (×2): qty 5

## 2013-01-09 MED ORDER — METHYLPREDNISOLONE SODIUM SUCC 125 MG IJ SOLR
125.0000 mg | Freq: Once | INTRAMUSCULAR | Status: AC
  Administered 2013-01-09: 125 mg via INTRAVENOUS
  Filled 2013-01-09: qty 2

## 2013-01-09 MED ORDER — HYDRALAZINE HCL 20 MG/ML IJ SOLN: 5.0000 mg | INTRAMUSCULAR | Status: DC | PRN

## 2013-01-09 MED ORDER — ALTEPLASE 2 MG IJ SOLR
2.0000 mg | Freq: Once | INTRAMUSCULAR | Status: DC | PRN
  Filled 2013-01-09: qty 2

## 2013-01-09 MED ORDER — SODIUM CHLORIDE 0.9 % IJ SOLN
3.0000 mL | Freq: Two times a day (BID) | INTRAMUSCULAR | Status: DC
  Administered 2013-01-10: 3 mL via INTRAVENOUS

## 2013-01-09 MED ORDER — PENTAFLUOROPROP-TETRAFLUOROETH EX AERO: 1.0000 "application " | INHALATION_SPRAY | CUTANEOUS | Status: DC | PRN

## 2013-01-09 MED ORDER — SODIUM PHOSPHATE 3 MMOLE/ML IV SOLN: 20.0000 mmol | Freq: Once | INTRAVENOUS | Status: DC

## 2013-01-09 MED ORDER — DARBEPOETIN ALFA-POLYSORBATE 150 MCG/0.3ML IJ SOLN
INTRAMUSCULAR | Status: AC
  Administered 2013-01-09: 150 ug via INTRAVENOUS
  Filled 2013-01-09: qty 0.3

## 2013-01-09 MED ORDER — ALPRAZOLAM 0.5 MG PO TABS
0.5000 mg | ORAL_TABLET | Freq: Two times a day (BID) | ORAL | Status: DC | PRN
  Administered 2013-01-10: 0.5 mg via ORAL

## 2013-01-09 NOTE — ED Provider Notes (Signed)
History     CSN: [id number]  Arrival date & time 01/08/13  2200   First MD Initiated Contact with Patient 01/08/13 2342      Chief Complaint  Patient presents with  . Shortness of Breath    (Consider location/radiation/quality/duration/timing/severity/associated sxs/prior treatment) HPI  Mr. Barstow is a pleasant 57 yo man with ESRD secondary to malignant HTN. The patient also has a history of hepatitis B, HIV.  Patient says that last viral load was undetectable and CD4 count was "good".    He presents with complaints with complaints of SOB x approximately 24 hrs. He has a nonproductive cough. Denies chest pain. No fever. Patient notes marked orthopnea. He typically has no orthopnea and sleeps on only 1 pillow. Patient notes approx 1 to 2 days of increased LE edema.   Patient says he first thought that he was volume overloaded. He did a home dialysis tx yesterday and has not noted improvement. He does home H/D 4 times per week.   Patient denies any history of PCP pneumonia or opportunistic infection. He says he has used an Albuterol MDI on a prn basis in the past but does not currently do so. He denies any previous diagnosis of asthma or COPD. He smokes approx 1 cigarette or less per day. Denies illicit drug use. No alcohol use.   Patient was seen here approx 1 month ago for SOB, diagnosed with volume overload, hemodialyzed and then discharged to home.   Patient is noted to be hypertensive with BP of 184/114 on my exam. He notes that he has not had his evening doses of amlodipine, carvedilol or hydralazine.   Past Medical History  Diagnosis Date  . Hypertension   . Gout   . Anemia   . Seizures 06/02/2011  . Thrombocytopenia   . Chronic kidney disease     ESRD requiring hemodialysis  . Anxiety   . Hepatitis B carrier   . HIV infection   . Hemodialysis patient   . Seizure     Past Surgical History  Procedure Laterality Date  . Av fistula placement  06/02/11    Left  radiocephalic AVF    Family History  Problem Relation Age of Onset  . Hypertension Mother   . Cancer Father     History  Substance Use Topics  . Smoking status: Current Some Day Smoker    Types: Cigarettes  . Smokeless tobacco: Never Used  . Alcohol Use: No      Review of Systems Gen: no weight loss, fevers, chills, night sweats Eyes: no discharge or drainage, no occular pain or visual changes Nose: no epistaxis or rhinorrhea Mouth: no dental pain, no sore throat Neck: no neck pain Lungs: As per history of present illness, otherwise negative CV: As per history of present illness, otherwise negative Abd: no abdominal pain, nausea, vomiting GU: no dysuria or gross hematuria MSK: no myalgias or arthralgias Neuro: no headache, no focal neurologic deficits Skin: no rash Psyche: negative.  Allergies  Lisinopril and Penicillins  Home Medications   Current Outpatient Rx  Name  Route  Sig  Dispense  Refill  . albuterol (PROVENTIL HFA;VENTOLIN HFA) 108 (90 BASE) MCG/ACT inhaler   Inhalation   Inhale 2 puffs into the lungs every 4 (four) hours as needed. For shortness of breath         . ALPRAZolam (XANAX) 0.5 MG tablet   Oral   Take 1 tablet (0.5 mg total) by mouth 2 (two) times daily as  needed for sleep.   60 tablet   2   . amLODipine (NORVASC) 10 MG tablet   Oral   Take 10 mg by mouth 2 (two) times daily.         . calcitRIOL (ROCALTROL) 0.25 MCG capsule   Oral   Take 0.25 mcg by mouth 2 (two) times daily.         . Calcium Acetate 667 MG TABS   Oral   Take 1,334-2,001 mg by mouth 3 (three) times daily with meals. Takes 3 capsules with meals and 2 capsules with snacks         . carvedilol (COREG) 12.5 MG tablet   Oral   Take 1 tablet (12.5 mg total) by mouth 2 (two) times daily with a meal.   60 tablet   5   . cinacalcet (SENSIPAR) 30 MG tablet   Oral   Take 30 mg by mouth daily.         . hydrALAZINE (APRESOLINE) 25 MG tablet   Oral   Take  1 tablet (25 mg total) by mouth 3 (three) times daily.   90 tablet   0   . lamivudine (EPIVIR-HBV) 5 MG/ML solution   Oral   Take 40 mg by mouth daily.         . multivitamin (RENA-VIT) TABS tablet   Oral   Take 1 tablet by mouth daily.           . raltegravir (ISENTRESS) 400 MG tablet   Oral   Take 1 tablet (400 mg total) by mouth 2 (two) times daily.   60 tablet   11   . tenofovir (VIREAD) 300 MG tablet   Oral   Take 300 mg by mouth every 7 (seven) days. On Saturdays         . traZODone (DESYREL) 50 MG tablet   Oral   Take 1 tablet (50 mg total) by mouth at bedtime.   30 tablet   1     BP 177/92  Pulse 87  Temp(Src) 98.4 F (36.9 C) (Oral)  Resp 20  SpO2 95%  Physical Exam Gen: well developed and well nourished appearing, mild to moderately labored breathing is appreciated. Head: NCAT Eyes: PERL, EOMI Nose: no epistaixis or rhinorrhea Mouth/throat: mucosa is moist and pink Neck: supple, no stridor, no JVD is appreciated Lungs: Respiratory rate is 24-28 times per minute, bibasilar rales, scattered and diffuse wheezing. Mild accessory muscle use. Abd: soft, obese, notender, nondistended Back: no ttp, no cva ttp Skin: no rashese, wnl Neuro: CN ii-xii grossly intact, no focal deficits EXT: 1+ symmetric pitting le edema.  Psyche; normal affect,  calm and cooperative.   ED Course  Procedures (including critical care time)  Results for orders placed during the hospital encounter of 01/08/13 (from the past 24 hour(s))  CBC WITH DIFFERENTIAL     Status: Abnormal   Collection Time    01/08/13 10:15 PM      Result Value Range   WBC 7.8  4.0 - 10.5 K/uL   RBC 2.61 (*) 4.22 - 5.81 MIL/uL   Hemoglobin 7.3 (*) 13.0 - 17.0 g/dL   HCT 20.8 (*) 39.0 - 52.0 %   MCV 79.7  78.0 - 100.0 fL   MCH 28.0  26.0 - 34.0 pg   MCHC 35.1  30.0 - 36.0 g/dL   RDW 17.0 (*) 11.5 - 15.5 %   Platelets 86 (*) 150 - 400 K/uL   Neutrophils Relative 65  43 - 77 %   Neutro Abs 5.1   1.7 - 7.7 K/uL   Lymphocytes Relative 20  12 - 46 %   Lymphs Abs 1.6  0.7 - 4.0 K/uL   Monocytes Relative 9  3 - 12 %   Monocytes Absolute 0.7  0.1 - 1.0 K/uL   Eosinophils Relative 7 (*) 0 - 5 %   Eosinophils Absolute 0.5  0.0 - 0.7 K/uL   Basophils Relative 0  0 - 1 %   Basophils Absolute 0.0  0.0 - 0.1 K/uL  COMPREHENSIVE METABOLIC PANEL     Status: Abnormal   Collection Time    01/08/13 10:15 PM      Result Value Range   Sodium 125 (*) 135 - 145 mEq/L   Potassium 5.5 (*) 3.5 - 5.1 mEq/L   Chloride 85 (*) 96 - 112 mEq/L   CO2 27  19 - 32 mEq/L   Glucose, Bld 94  70 - 99 mg/dL   BUN 106 (*) 6 - 23 mg/dL   Creatinine, Ser 16.69 (*) 0.50 - 1.35 mg/dL   Calcium 9.9  8.4 - 10.5 mg/dL   Total Protein 9.5 (*) 6.0 - 8.3 g/dL   Albumin 4.0  3.5 - 5.2 g/dL   AST 16  0 - 37 U/L   ALT 8  0 - 53 U/L   Alkaline Phosphatase 65  39 - 117 U/L   Total Bilirubin 0.3  0.3 - 1.2 mg/dL   GFR calc non Af Amer 3 (*) >90 mL/min   GFR calc Af Amer 3 (*) >90 mL/min   Dg Chest 2 View  01/08/2013  *RADIOLOGY REPORT*  Clinical Data: Shortness of breath.  Home dialysis patient.  CHEST - 2 VIEW  Comparison: 12/20/2012  Findings: Midline trachea.  Moderate cardiomegaly.  Development of small left pleural effusion. No pneumothorax.  Pulmonary interstitial thickening is lower lobe predominant and similar to on the prior exam.  Felt to be slightly increased since 08/27/2011.  Areas of scarring are lower lobe predominant and greater on the right than left. No lobar consolidation.   The right superior hilar region is mildly prominent, similar to on the prior exam but new or increased since the 2008 study.  IMPRESSION: Cardiomegaly with lower lobe predominant interstitial thickening. This is similar to on the prior exam, and remains suspicious for pulmonary venous congestion.  Development of a small left pleural effusion.  Suggestion of right suprahilar soft tissue fullness. Possibly related to venous hypertension.   Consider follow-up radiographs after appropriate diuresis with special attention to this area.   Original Report Authenticated By: Abigail Miyamoto, M.D.     EKG: nsr, no acute ischemic changes, normal intervals, normal axis, normal qrs complex   Ddx: pna, pleural effusion, pulmonary edema, ACS, reactive airways disease, bronchitis  MDM  Patient with wheezing and signs of volume overload on CXR. Sx also consistent with volume overload. Patient is noted to be hyponatremic on labs with mild hyperkalemia. We are awaiting results of BNP and troponin. Anticipate admission for H/D. We are treating with albuterol and solumedrol for wheezing. We are treating with home po doses of amlodipine, carvedilol and IV hydralazine for HTN.    0228: Patient re-evaluated. Wheezing has resolved after tx with Albuterol However, rales and tachypnea along with subjective sob persist. HTN improved but, not resolved after above intervention. Case discussed with IM resident who agrees to see and admit. Diagnosis and plan discussed with patient who is in agreement.  CRITICAL CARE Performed by: Elyn Peers   Total critical care time: 35  Critical care time was exclusive of separately billable procedures and treating other patients.  Critical care was necessary to treat or prevent imminent or life-threatening deterioration.  Critical care was time spent personally by me on the following activities: development of treatment plan with patient and/or surrogate as well as nursing, discussions with consultants, evaluation of patient's response to treatment, examination of patient, obtaining history from patient or surrogate, ordering and performing treatments and interventions, ordering and review of laboratory studies, ordering and review of radiographic studies, pulse oximetry and re-evaluation of patient's condition.   Elyn Peers, MD 01/09/13 [phone]

## 2013-01-09 NOTE — Progress Notes (Signed)
  Echocardiogram 2D Echocardiogram has been performed.  Mauricio Po 01/09/2013, 9:47 AM

## 2013-01-09 NOTE — H&P (Signed)
INTERNAL MEDICINE TEACHING SERVICE Attending Admission Note  Date: 01/09/2013  Patient name: Jeffrey [name]  Medical record number: [id number]  Date of birth: May 03, 1956    I have seen and evaluated [name] and discussed their care with the Residency Team.  70 yr. Old AAM w/ hx ESRD on home HD 4 times a week, HIV, HBV, HTN, presented due to SOB and increased LE edema.  He states he has recently been having difficulty with his machine, stating that he has had to decrease the flow rate due to an error message. In addition, his BP cough has also been having difficulty and been giving him error messages.  He states he has been quite busy over this past weekend due to the easter holiday playing instruments in church.  He states he remembers having HD at our hospital around 4/1 and was concerned about the dry weight they told him to keep (92 kg) and states he disagrees, since his dry weight is reportedly around 97 kg. This morning he states he feels better. Denies CP, although still with some SOB while lying in bed. Review of CXR on admission reveals likely pulmonary venous congestion. He has a small left pleural effusion and some right suprahilar soft tissue fullness, which could be venous hypertension, but needs a F/U CXR after HD to assure this is not an underlying mass.  Physical Exam: Blood pressure 177/96, pulse 90, temperature 98.6 F (37 C), temperature source Oral, resp. rate 18, height 6' (1.829 m), weight 215 lb 9.8 oz (97.8 kg), SpO2 96.00%. Body mass index is 29.24 kg/(m^2).   General: Vital signs reviewed and noted. Well-developed, well-nourished, in no acute distress; alert, appropriate and cooperative throughout examination.  Head: Normocephalic, atraumatic.  Eyes: PERRL, EOMI, No signs of anemia or jaundince.  Nose: Mucous membranes moist, not inflammed, nonerythematous.  Throat: Oropharynx nonerythematous, no exudate appreciated.   Neck: No deformities, masses, or tenderness  noted.Supple, No carotid Bruits, no JVD.  Lungs:  Normal respiratory effort. Clear to auscultation BL without crackles or wheezes.  Heart: RRR. S1 and S2 normal without gallop, murmur, or rubs. No JVD.  Abdomen:  BS normoactive. Soft, Nondistended, non-tender.  No masses or organomegaly.  Extremities: 1+ bilat pitting edema. LUE AVF with palpable thrill and auscultated bruit.   Neurologic: A&O X3, CN II - XII are grossly intact. Motor strength is 5/5 in the all 4 extremities, Sensations intact to light touch, Cerebellar signs negative.  Skin: No visible rashes, scars.    Lab results: Results for orders placed during the hospital encounter of 01/08/13 (from the past 24 hour(s))  CBC WITH DIFFERENTIAL     Status: Abnormal   Collection Time    01/08/13 10:15 PM      Result Value Range   WBC 7.8  4.0 - 10.5 K/uL   RBC 2.61 (*) 4.22 - 5.81 MIL/uL   Hemoglobin 7.3 (*) 13.0 - 17.0 g/dL   HCT 20.8 (*) 39.0 - 52.0 %   MCV 79.7  78.0 - 100.0 fL   MCH 28.0  26.0 - 34.0 pg   MCHC 35.1  30.0 - 36.0 g/dL   RDW 17.0 (*) 11.5 - 15.5 %   Platelets 86 (*) 150 - 400 K/uL   Neutrophils Relative 65  43 - 77 %   Neutro Abs 5.1  1.7 - 7.7 K/uL   Lymphocytes Relative 20  12 - 46 %   Lymphs Abs 1.6  0.7 - 4.0 K/uL   Monocytes  Relative 9  3 - 12 %   Monocytes Absolute 0.7  0.1 - 1.0 K/uL   Eosinophils Relative 7 (*) 0 - 5 %   Eosinophils Absolute 0.5  0.0 - 0.7 K/uL   Basophils Relative 0  0 - 1 %   Basophils Absolute 0.0  0.0 - 0.1 K/uL  COMPREHENSIVE METABOLIC PANEL     Status: Abnormal   Collection Time    01/08/13 10:15 PM      Result Value Range   Sodium 125 (*) 135 - 145 mEq/L   Potassium 5.5 (*) 3.5 - 5.1 mEq/L   Chloride 85 (*) 96 - 112 mEq/L   CO2 27  19 - 32 mEq/L   Glucose, Bld 94  70 - 99 mg/dL   BUN 106 (*) 6 - 23 mg/dL   Creatinine, Ser 16.69 (*) 0.50 - 1.35 mg/dL   Calcium 9.9  8.4 - 10.5 mg/dL   Total Protein 9.5 (*) 6.0 - 8.3 g/dL   Albumin 4.0  3.5 - 5.2 g/dL   AST 16  0 - 37  U/L   ALT 8  0 - 53 U/L   Alkaline Phosphatase 65  39 - 117 U/L   Total Bilirubin 0.3  0.3 - 1.2 mg/dL   GFR calc non Af Amer 3 (*) >90 mL/min   GFR calc Af Amer 3 (*) >90 mL/min  PRO B NATRIURETIC PEPTIDE     Status: Abnormal   Collection Time    01/09/13 12:15 AM      Result Value Range   Pro B Natriuretic peptide (BNP) 9909.0 (*) 0 - 125 pg/mL  TROPONIN I     Status: None   Collection Time    01/09/13 12:15 AM      Result Value Range   Troponin I <0.30  <0.30 ng/mL  MRSA PCR SCREENING     Status: None   Collection Time    01/09/13  5:32 AM      Result Value Range   MRSA by PCR NEGATIVE  NEGATIVE    Imaging results:  Dg Chest 2 View  01/08/2013  *RADIOLOGY REPORT*  Clinical Data: Shortness of breath.  Home dialysis patient.  CHEST - 2 VIEW  Comparison: 12/20/2012  Findings: Midline trachea.  Moderate cardiomegaly.  Development of small left pleural effusion. No pneumothorax.  Pulmonary interstitial thickening is lower lobe predominant and similar to on the prior exam.  Felt to be slightly increased since 08/27/2011.  Areas of scarring are lower lobe predominant and greater on the right than left. No lobar consolidation.   The right superior hilar region is mildly prominent, similar to on the prior exam but new or increased since the 2008 study.  IMPRESSION: Cardiomegaly with lower lobe predominant interstitial thickening. This is similar to on the prior exam, and remains suspicious for pulmonary venous congestion.  Development of a small left pleural effusion.  Suggestion of right suprahilar soft tissue fullness. Possibly related to venous hypertension.  Consider follow-up radiographs after appropriate diuresis with special attention to this area.   Original Report Authenticated By: Abigail Miyamoto, M.D.      Assessment and Plan: I agree with the formulated Assessment and Plan with the following changes:  30 yr. Old AAM w/ hx ESRD on home HD 4 times a week, HIV, HBV, HTN, presented due  to SOB and increased LE edema, with volume overload. 1) Volume overload: His machine at home is likely not functioning properly. He states he has a  caretaker cannulate his AVF.  He states they send blood samples to the lab as required by his nephrologist. It would be useful to have records of his calculated Kt/V to see how adequate his sessions are. Given findings on BMP and his volume overload, I am certain it is not adequate. Consult nephrology for HD. We will need to notify his nephrologist to get him a new machine. 2) Anemia/normocytic: I'm sure his nephrologist has records of past h/h, ESA, iron tx. He is not symptomatic and I do not think he needs a transfusion. There may be a dilutional component as well due to volume overload. I would leave it to his nephrologist to decide on IV iron therapy/ESA tx. -rest per resident note.   The treatment plan was discussed in detail with the patient.  Alternatives to treatment, side effects, risks and benefits, and complications were discussed with the patient. Informed consent was obtained. The patient agrees to proceed with the current treatment plan.  Dominic Pea, DO 4/21/201410:20 AM

## 2013-01-09 NOTE — H&P (Signed)
Hospital Admission Note Date: 01/09/2013  Patient name: Jeffrey [name] Medical record number: [id number] Date of birth: 12-Jan-1956 Age: 57 y.o. Gender: male PCP: Bobby Rumpf, MD Nephrologist: Dr. Justin Mend   Medical Service: Internal Medicine  Attending physician: Dr. Murlean Caller     1st Contact: Dr. Juleen China Pager:[phone] 2nd Contact: Dr. Owens Shark [phone] After 5 pm or weekends: 1st Contact: Pager: [phone] 2nd Contact:  Pager: [phone]  Chief Complaint: Shortness of breath, lower extremity edema   History of Present Illness: 57 y.o male PMH HIV/HBV, HTN, anemia/thrombocytopenia, ESRD on home HD 3-4 times weekly via left forearm fistula.  He presents for sob and lower extremity edema.  Shortness of breath has been worsening since Saturday prior to admission.  SOB is at rest.  Nothing made worse.  A breathing treatment in the ED and sitting up helps sob.  He did not try anything at home.  He reports lower extremity edema since 12/20/12 at ED visit.  He states his leg swelling has been fluctuating with HD.  He had HD 4 times this week (normally does HD 3-4 x per week) with the last session Saturday.  He states he is having a problem with his machine at home.  He was able to take of 2 L on Saturday despite machine problems.  Other review of symptoms: decreased appetite, per patient dry weight 92 kg but last note states 97 kg is dry weight per renal, dry cough, decreased sleep due to breathing difficulty.   Meds: Current Outpatient Rx  Name  Route  Sig  Dispense  Refill  . ALPRAZolam (XANAX) 0.5 MG tablet   Oral   Take 1 tablet (0.5 mg total) by mouth 2 (two) times daily as needed for sleep.   60 tablet   2   . amLODipine (NORVASC) 10 MG tablet   Oral   Take 10 mg by mouth 2 (two) times daily.         . calcitRIOL (ROCALTROL) 0.25 MCG capsule   Oral   Take 0.25 mcg by mouth 2 (two) times daily.         . Calcium Acetate 667 MG TABS   Oral   Take 1,334-2,001 mg by mouth 3 (three) times  daily with meals. Takes 3 capsules with meals and 2 capsules with snacks         . carvedilol (COREG) 12.5 MG tablet   Oral   Take 1 tablet (12.5 mg total) by mouth 2 (two) times daily with a meal.   60 tablet   5   . cinacalcet (SENSIPAR) 30 MG tablet   Oral   Take 30 mg by mouth daily.         . hydrALAZINE (APRESOLINE) 25 MG tablet   Oral   Take 1 tablet (25 mg total) by mouth 3 (three) times daily.   90 tablet   0   . lamivudine (EPIVIR-HBV) 5 MG/ML solution   Oral   Take 25 mg by mouth daily.          . multivitamin (RENA-VIT) TABS tablet   Oral   Take 1 tablet by mouth daily.           . raltegravir (ISENTRESS) 400 MG tablet   Oral   Take 1 tablet (400 mg total) by mouth 2 (two) times daily.   60 tablet   11   . tenofovir (VIREAD) 300 MG tablet   Oral   Take 300 mg by mouth every 7 (seven) days.  On Saturdays         . traZODone (DESYREL) 50 MG tablet   Oral   Take 1 tablet (50 mg total) by mouth at bedtime.   30 tablet   1    Not using Albuterol or Trazadone at home.  Patient unsure frequency of Epivir  Takes Xanax once daily 0.5 prn if at all   Allergies: Allergies as of 01/08/2013 - Review Complete 12/20/2012  Allergen Reaction Noted  . Lisinopril  08/31/2012  . Penicillins Other (See Comments)    Past Medical History  Diagnosis Date  . Hypertension   . Gout   . Anemia   . Seizures 06/02/2011  . Thrombocytopenia   . Chronic kidney disease     ESRD requiring hemodialysis  . Anxiety   . Hepatitis B carrier   . HIV infection   . Hemodialysis patient   . Seizure    Past Surgical History  Procedure Laterality Date  . Av fistula placement  06/02/11    Left radiocephalic AVF   Previous temporary HD catheter removal   Family History  Problem Relation Age of Onset  . Hypertension Mother   . Cancer Father    ?type of cancer father    History   Social History  . Marital Status: Single    Spouse Name: N/A    Number of  Children: N/A  . Years of Education: N/A   Occupational History  . Not on file.   Social History Main Topics  . Smoking status: Current Some Day Smoker    Types: Cigarettes  . Smokeless tobacco: Never Used  . Alcohol Use: No  . Drug Use: No     Comment: uds (+) cocaine in 08/2011 but pt states was taking sudafed at the time  . Sexually Active: Yes -- Male partner(s)     Comment: pt. declined condoms   Other Topics Concern  . Not on file   Social History Narrative  . No narrative on file   Smoking 1 pack of cigarettes last 1 month   SH: No kids. Lives with "brother" who is care caretaker. 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    Review of Systems: General: +decreased appetite, denies (fever, chills, sweating), per patient dry weight 92 kg but last note states 97 kg is dry weight per renal HEENT: denies dysphagia  Cardiac: denies chest pain  Pulm: +sob, +dry cough, denies orthopnea  Abd/GU: denies abdominal pain, denies constipation/diarrhea, +oliguric, denies blood in stool  Ext: +lower extremity edema  Neuro: no active issues  Psych: decreased sleep due to breathing difficulty   Physical Exam: 87, 98% on 2-3 L [place], 148/90 (105)  Blood pressure 157/90, pulse 85, temperature 98.4 F (36.9 C), temperature source Oral, resp. rate 15, SpO2 99.00%. Vitals reviewed. General: resting in bed, NAD HEENT: PERRL b/l, no scleral icterus, no teeth, difficult to access JVD due to body habitus  Cardiac: RRR, no rubs, murmurs or gallops Pulm: min. Crackles at bases b/l  Abd: soft, nontender, nondistended, BS present, obese Ext: warm and well perfused, 2+ lower extremity edema b/l; left forearm fistula with bruits x 2  Neuro: alert and oriented X3, neurologically intact, moving all 4 extremities    Lab results: Basic Metabolic Panel:  Recent Labs  01/08/13 2215  NA 125*  K 5.5*  CL 85*  CO2 27  GLUCOSE 94  BUN 106*  CREATININE 16.69*   CALCIUM 9.9   Liver Function  Tests:  Recent Labs  01/08/13 2215  AST 16  ALT 8  ALKPHOS 65  BILITOT 0.3  PROT 9.5*  ALBUMIN 4.0   CBC:  Recent Labs  01/08/13 2215  WBC 7.8  NEUTROABS 5.1  HGB 7.3*  HCT 20.8*  MCV 79.7  PLT 86*   Cardiac Enzymes:  Recent Labs  01/09/13 0015  TROPONINI <0.30   BNP:  Recent Labs  01/09/13 0015  PROBNP 9909.0*    Anemia Panel: No results found for this basename: VITAMINB12, FOLATE, FERRITIN, TIBC, IRON, RETICCTPCT,  in the last 72 hours  Urine Drug Screen: Drugs of Abuse     Component Value Date/Time   LABOPIA NONE DETECTED 06/02/2011 0948   COCAINSCRNUR POSITIVE* 06/02/2011 0948   LABBENZ NONE DETECTED 06/02/2011 0948   AMPHETMU NONE DETECTED 06/02/2011 0948   THCU NONE DETECTED 06/02/2011 0948   LABBARB NONE DETECTED 06/02/2011 0948     Urinalysis: No results found for this basename: COLORURINE, APPERANCEUR, LABSPEC, PHURINE, GLUCOSEU, HGBUR, BILIRUBINUR, KETONESUR, PROTEINUR, UROBILINOGEN, NITRITE, LEUKOCYTESUR,  in the last 72 hours  Misc. Labs: Blood culture, Mag, Phos, UDS, anemia panel, FOBT, INR, UPEP  Imaging results:  Dg Chest 2 View  01/08/2013  *RADIOLOGY REPORT*  Clinical Data: Shortness of breath.  Home dialysis patient.  CHEST - 2 VIEW  Comparison: 12/20/2012  Findings: Midline trachea.  Moderate cardiomegaly.  Development of small left pleural effusion. No pneumothorax.  Pulmonary interstitial thickening is lower lobe predominant and similar to on the prior exam.  Felt to be slightly increased since 08/27/2011.  Areas of scarring are lower lobe predominant and greater on the right than left. No lobar consolidation.   The right superior hilar region is mildly prominent, similar to on the prior exam but new or increased since the 2008 study.  IMPRESSION: Cardiomegaly with lower lobe predominant interstitial thickening. This is similar to on the prior exam, and remains suspicious for pulmonary venous congestion.   Development of a small left pleural effusion.  Suggestion of right suprahilar soft tissue fullness. Possibly related to venous hypertension.  Consider follow-up radiographs after appropriate diuresis with special attention to this area.   Original Report Authenticated By: Abigail Miyamoto, M.D.     Other results: EKG: NSR, HR 83. No acute ST changes or peaked T wave changes. T waves in V4 and V5 with premature atrial beat or extra P wave.  Normal internals. No LVH. C  Assessment & Plan by Problem: 57 y.o male with ESRD on home HD 3-4 times a week presents with sob and lower extremity edema and suspected pulmonary vascular congestion  1. Shortness of breath likely due to fluid overload  -Since 01/07/13.  Associated with dry cough. ProBNP 9909. Troponin negative x 1. CXR with cardiomegaly with lower lobe predominant interstitial thickening. This is similar to on the prior exam, and remains suspicious for  pulmonary venous congestion. Development of a small left pleural effusion. Suggestion of right suprahilar soft tissue fullness. Possibly related to venous hypertension. Consider follow-up radiographs after appropriate diuresis with special attention to this are. He was given Duoneb and Solumedrol 125 mg x 1 in the ED.  -Etiology could be due to fluid overload due to ESRD but need to work up further for CHF, diastolic dysfunction.  -Patients HD machine is defective at home and needs further help from renal   -Pending blood cultures (ordered by ED) -daily weight, strict i/o  -2D echo   2. ESRD on HD  -Fistula in left lower  arm intact. Creatinine 16.69 on admission with baseline not clear  -HD three to four times a week at home -will consult renal for HD -Patient has a defective HD machine at home and needs renal's help  3. Normocytic anemia  -Hbg on admission 7.3.  Baseline 10-12. Etiology possibly related to chronic kidney disease -Patient states at one point he was on iron and EPO which he is no  longer taking -will need outpatient colonoscopy as he has never had one 10/2012 OV with Dr. Johnnye Sima patient declined.  -trend CBC, will FOBT -Repeat anemia panel   4. Thrombocytopenia, chronic  -Platelets 86 on admission. -Etiology could be related to chronic illness  -Consider peripheral smear -trend cbc   5. HIV, controlled and HBV -CD4 310 in 09/2012 with undetectable viral load -Follows with Dr. Johnnye Sima outpatient -Home med Raltegravir 400 mg bid, Viread 300 mg q Sat, Epivir 25 mg qd (? Frequency of Epivir patient not sure)  6. History of HTN, uncontrolled  -BP 190/93 on admission trending downward  -On Norvasc 10 mg bid, Coreg 12.5 mg bid, Hydralazine 25 mg tid home meds  -Given Coreg 12.5 mg x 1, Norvasc 10 mg x 1, Hydralazine 10 mg iv once in the ED -trend VS   7. History of seizure disorder  -last seizure in 2012 with diagnosis of HIV. No acute issues   8. Elevated protein gap -Difference of TP 9.5 with Albumin 4 is 5.5  -Will order UPEP to w/u further. If abnormal consider SPEP  9. Tobacco abuse  -smoking cessation   10. F/E/N -Asymptomatic Hyponatremia on admission (125) appears chronic likely due to fluid overload  -HyperKalemia (5.5) without peaked T waves changes on EKG -renal diet   11. DVT px  -scds due to thrombocytopenia    Dispo: Disposition is deferred at this time, awaiting improvement of current medical problems. Anticipated discharge in approximately 1-3  day(s).   The patient does have a current PCP Bobby Rumpf, MD), therefore will not be requiring [facility]PC follow-up after discharge.   The patient does not have transportation limitations that hinder transportation to clinic appointments.  SignedCresenciano Genre [id number] 01/09/2013, 4:01 AM

## 2013-01-09 NOTE — Consult Note (Addendum)
[facility] Renal Consultation Note  Indication for Consultation:  Management of ESRD/hemodialysis; anemia, hypertension/volume and secondary hyperparathyroidism  HPI: Jeffrey [name] is a 57 y.o. male with ESRD, on home dialysis every other day (usually four days a week), who presented to the ED yesterday with worsening dyspnea on exertion and orthopnea for one day.  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  However, he states that his machine has not been functioning properly (maximum flow rates 320) and believes he did not have enough fluid removed.  He had dialysis at the hospital on 4/1 after missing dialysis for four days when he was in the process of moving and could not get his dialysis machine transported to his new place.  His hemoglobin has fallen to 6.7 today from 12.8 on 3/17, when his outpatient Epogen was placed on hold.  Chest x-ray yesterday showed cardiomegaly with lower lobe predominant interstitial thickening, but no overt edema.  Currently he denies any fever, chills, nausea, or vomiting, but has mild shortness of breath on exertion.   Home Dialysis Orders (NxStage):  4 days a week (usually every other day).  EDW 97.5 kg  Time 4 hrs  Heparin 6000 U. Access AVF @ LFA  BFR 400  Dialysate per HD 40 L   Flow fraction 49%  Epogen on hold for Hgb 12.8 on 3/17.  Past Medical History  Diagnosis Date  . Hypertension   . Gout   . Anemia   . Seizures 06/02/2011  . Thrombocytopenia   . Anxiety   . Hepatitis B carrier   . HIV infection   . Seizure   . End stage renal disease on home HD 07/10/2011    Started HD in September 2012 at [facility] with a tunneled HD catheter. Now dialyzing through AVF L lower arm with buttonhole technique. Is doing home hemodialysis, his brother took the training course and is doing the HD treatments at home on Mr Jeffrey [name].  They are roommates for 23 years.   His brother works 3rd shift and gets off at about [place] and then puts Mr Jeffrey [name] on HD in the morning after getting home. He does dialyze on a typical schedule, but most of the time he does HD about 4 times a week, for about 4 hours per treatment. Cause of ESRD was HTN according to patient. He says he let his health go and ending up with complications, that he didn't like seeing doctors in those days.  He says he was diagnosed with HTN when he lived in [place] in his 40's, he had a severe headache and BP was 220/130 in the ED.  BP responded well to medication.     Past Surgical History  Procedure Laterality Date  . Av fistula placement  06/02/11    Left radiocephalic AVF  . Other surgical history      removal temporary HD catheter    Family History  Problem Relation Age of Onset  . Hypertension Mother   . Cancer Father    Social History He reports that he previously smoked about a pack of cigarettes a month and now rarely smokes at all.  He previously drank alcohol, but denies any history of illicit drug use.  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    Allergies  Allergen Reactions  . Lisinopril     Throat swelling  . Penicillins Other (See Comments)    Childhood allergy   Prior to Admission medications   Medication Sig Start Date End Date Taking? Authorizing Provider  ALPRAZolam Duanne Moron) 0.5 MG tablet Take 1 tablet (0.5 mg total) by mouth 2 (two) times daily as needed for sleep. 11/08/12  Yes Campbell Riches, MD  amLODipine (NORVASC) 10 MG tablet Take 10 mg by mouth 2 (two) times daily.   Yes Historical Provider, MD  calcitRIOL (ROCALTROL) 0.25 MCG capsule Take 0.25 mcg by mouth 2 (two) times daily.   Yes Historical Provider, MD  Calcium Acetate 667 MG TABS Take 1,334-2,001 mg by mouth 3 (three) times daily with meals. Takes 3 capsules with meals and 2 capsules with snacks   Yes Historical  Provider, MD  carvedilol (COREG) 12.5 MG tablet Take 1 tablet (12.5 mg total) by mouth 2 (two) times daily with a meal. 01/04/13  Yes Campbell Riches, MD  cinacalcet (SENSIPAR) 30 MG tablet Take 30 mg by mouth daily.   Yes Historical Provider, MD  hydrALAZINE (APRESOLINE) 25 MG tablet Take 1 tablet (25 mg total) by mouth 3 (three) times daily. 09/19/12  Yes Orpah Greek, MD  lamivudine (EPIVIR-HBV) 5 MG/ML solution Take 25 mg by mouth daily.    Yes Historical Provider, MD  multivitamin (RENA-VIT) TABS tablet Take 1 tablet by mouth daily.     Yes Historical Provider, MD  raltegravir (ISENTRESS) 400 MG tablet Take 1 tablet (400 mg total) by mouth 2 (two) times daily. 06/27/12  Yes Thayer Headings, MD  tenofovir (VIREAD) 300 MG tablet Take 300 mg by mouth every 7 (seven) days. On Saturdays   Yes Historical Provider, MD  traZODone (DESYREL) 50 MG tablet Take 1 tablet (50 mg total) by mouth at bedtime. 11/08/12  Yes Campbell Riches, MD   Labs:  Results for orders placed during the hospital encounter of 01/08/13 (from the past 48 hour(s))  CBC WITH DIFFERENTIAL     Status: Abnormal   Collection Time    01/08/13 10:15 PM      Result Value Range   WBC 7.8  4.0 - 10.5 K/uL   RBC 2.61 (*) 4.22 - 5.81 MIL/uL   Hemoglobin 7.3 (*) 13.0 - 17.0 g/dL   HCT 20.8 (*) 39.0 - 52.0 %   MCV 79.7  78.0 - 100.0 fL   MCH 28.0  26.0 - 34.0 pg   MCHC 35.1  30.0 - 36.0 g/dL   RDW 17.0 (*) 11.5 - 15.5 %   Platelets 86 (*) 150 - 400 K/uL   Comment: PLATELET COUNT CONFIRMED BY SMEAR   Neutrophils Relative 65  43 - 77 %   Neutro Abs 5.1  1.7 - 7.7 K/uL   Lymphocytes Relative 20  12 - 46 %   Lymphs Abs 1.6  0.7 - 4.0 K/uL   Monocytes Relative 9  3 - 12 %   Monocytes Absolute 0.7  0.1 - 1.0 K/uL   Eosinophils Relative 7 (*) 0 - 5 %   Eosinophils Absolute 0.5  0.0 - 0.7 K/uL   Basophils Relative 0  0 - 1 %   Basophils Absolute 0.0  0.0 - 0.1 K/uL  COMPREHENSIVE METABOLIC PANEL     Status: Abnormal    Collection Time    01/08/13 10:15 PM      Result Value Range   Sodium 125 (*) 135 - 145 mEq/L  Potassium 5.5 (*) 3.5 - 5.1 mEq/L   Chloride 85 (*) 96 - 112 mEq/L   CO2 27  19 - 32 mEq/L   Glucose, Bld 94  70 - 99 mg/dL   BUN 106 (*) 6 - 23 mg/dL   Creatinine, Ser 16.69 (*) 0.50 - 1.35 mg/dL   Calcium 9.9  8.4 - 10.5 mg/dL   Total Protein 9.5 (*) 6.0 - 8.3 g/dL   Albumin 4.0  3.5 - 5.2 g/dL   AST 16  0 - 37 U/L   ALT 8  0 - 53 U/L   Alkaline Phosphatase 65  39 - 117 U/L   Total Bilirubin 0.3  0.3 - 1.2 mg/dL   GFR calc non Af Amer 3 (*) >90 mL/min   GFR calc Af Amer 3 (*) >90 mL/min   Comment:            The eGFR has been calculated     using the CKD EPI equation.     This calculation has not been     validated in all clinical     situations.     eGFR's persistently     <90 mL/min signify     possible Chronic Kidney Disease.  PRO B NATRIURETIC PEPTIDE     Status: Abnormal   Collection Time    01/09/13 12:15 AM      Result Value Range   Pro B Natriuretic peptide (BNP) 9909.0 (*) 0 - 125 pg/mL  TROPONIN I     Status: None   Collection Time    01/09/13 12:15 AM      Result Value Range   Troponin I <0.30  <0.30 ng/mL   Comment:            Due to the release kinetics of cTnI,     a negative result within the first hours     of the onset of symptoms does not rule out     myocardial infarction with certainty.     If myocardial infarction is still suspected,     repeat the test at appropriate intervals.  MRSA PCR SCREENING     Status: None   Collection Time    01/09/13  5:32 AM      Result Value Range   MRSA by PCR NEGATIVE  NEGATIVE   Comment:            The GeneXpert MRSA Assay (FDA     approved for NASAL specimens     only), is one component of a     comprehensive MRSA colonization     surveillance program. It is not     intended to diagnose MRSA     infection nor to guide or     monitor treatment for     MRSA infections.  RENAL FUNCTION PANEL     Status:  Abnormal   Collection Time    01/09/13 10:15 AM      Result Value Range   Sodium 125 (*) 135 - 145 mEq/L   Potassium 5.1  3.5 - 5.1 mEq/L   Chloride 84 (*) 96 - 112 mEq/L   CO2 24  19 - 32 mEq/L   Glucose, Bld 199 (*) 70 - 99 mg/dL   BUN 116 (*) 6 - 23 mg/dL   Creatinine, Ser 17.04 (*) 0.50 - 1.35 mg/dL   Calcium 9.7  8.4 - 10.5 mg/dL   Phosphorus 1.4 (*) 2.3 - 4.6 mg/dL   Albumin 3.7  3.5 -  5.2 g/dL   GFR calc non Af Amer 3 (*) >90 mL/min   GFR calc Af Amer 3 (*) >90 mL/min   Comment:            The eGFR has been calculated     using the CKD EPI equation.     This calculation has not been     validated in all clinical     situations.     eGFR's persistently     <90 mL/min signify     possible Chronic Kidney Disease.  CBC     Status: Abnormal   Collection Time    01/09/13 10:15 AM      Result Value Range   WBC 6.5  4.0 - 10.5 K/uL   RBC 2.39 (*) 4.22 - 5.81 MIL/uL   Hemoglobin 6.7 (*) 13.0 - 17.0 g/dL   Comment: CRITICAL RESULT CALLED TO, READ BACK BY AND VERIFIED WITH:     CTrish [place] RN @ [phone] GREEN R     REPEATED TO VERIFY   HCT 18.7 (*) 39.0 - 52.0 %   MCV 78.2  78.0 - 100.0 fL   MCH 28.0  26.0 - 34.0 pg   MCHC 35.8  30.0 - 36.0 g/dL   RDW 17.2 (*) 11.5 - 15.5 %   Platelets 84 (*) 150 - 400 K/uL   Comment: CONSISTENT WITH PREVIOUS RESULT  TYPE AND SCREEN     Status: None   Collection Time    01/09/13 11:35 AM      Result Value Range   ABO/RH(D) O NEG     Antibody Screen NEG     Sample Expiration 01/12/2013     Unit Number [id number]     Blood Component Type RED CELLS,LR     Unit division 00     Status of Unit ALLOCATED     Transfusion Status OK TO TRANSFUSE     Crossmatch Result Compatible     Unit Number [id number]     Blood Component Type RBC LR PHER1     Unit division 00     Status of Unit ALLOCATED     Transfusion Status OK TO TRANSFUSE     Crossmatch Result Compatible    PREPARE RBC (CROSSMATCH)     Status: None   Collection Time     01/09/13 11:35 AM      Result Value Range   Order Confirmation ORDER PROCESSED BY BLOOD BANK    CBC     Status: Abnormal   Collection Time    01/09/13  1:20 PM      Result Value Range   WBC 6.3  4.0 - 10.5 K/uL   RBC 2.29 (*) 4.22 - 5.81 MIL/uL   Hemoglobin 6.4 (*) 13.0 - 17.0 g/dL   Comment: CRITICAL VALUE NOTED.  VALUE IS CONSISTENT WITH PREVIOUSLY REPORTED AND CALLED VALUE.   HCT 17.8 (*) 39.0 - 52.0 %   MCV 77.7 (*) 78.0 - 100.0 fL   MCH 27.9  26.0 - 34.0 pg   MCHC 36.0  30.0 - 36.0 g/dL   RDW 17.2 (*) 11.5 - 15.5 %   Platelets 81 (*) 150 - 400 K/uL   Comment: CONSISTENT WITH PREVIOUS RESULT  PROTIME-INR     Status: None   Collection Time    01/09/13  1:20 PM      Result Value Range   Prothrombin Time 14.7  11.6 - 15.2 seconds   INR 1.17  0.00 - 1.49  Constitutional: negative for chills, fatigue, fevers and sweats Ears, nose, mouth, throat, and face: negative for earaches, hoarseness, nasal congestion and sore throat Respiratory: positive for cough and dyspnea on exertion Cardiovascular: positive for dyspnea and orthopnea, negative for chest pain and palpitations Gastrointestinal: negative for abdominal pain, change in bowel habits, nausea and vomiting Genitourinary:negative, oliguric Musculoskeletal:negative for arthralgias, back pain, myalgias and neck pain Neurological: negative for dizziness, gait problems, headaches, paresthesia and weakness  Physical Exam: Filed Vitals:   01/09/13 1330  BP: 174/98  Pulse: 98  Temp:   Resp: 17     General appearance: alert, cooperative and no distress Head: Normocephalic, without obvious abnormality, atraumatic Neck: no adenopathy, no carotid bruit, no JVD and supple, symmetrical, trachea midline Resp: clear to auscultation bilaterally Cardio: regular rate and rhythm, S1, S2 normal, no murmur, click, rub or gallop GI: soft, non-tender; bowel sounds normal; no masses,  no organomegaly Extremities: extremities normal,  atraumatic, no cyanosis or edema Neurologic: Grossly normal Dialysis Access: AVF @ LFA with + bruit   Assessment/Plan: 1. Dyspnea on exertion - likely secondary to volume overload; however, Hgb down significantly; afebrile, WBCs 6.5; chest x-ray with no sign of infiltrate, no overt edema; no improvement with nebulizer. 2. ESRD - on NxStage at home; 4 days a week; followed at [facility]; ran on 4/18 & 19.  HD pending. 3. Hypertension/volume  - BP 188/99 on Amlodipine 10 mg qd, Hydralazine 25 mg q8h; current wt 97.8 kg with EDW 97.5 kg, but with symptoms of volume overload. 4. Anemia  - Hgb down to 6.7, 7.3 yesterday, previously 12.8 on 3/17; outpatient Epogen on hold.  Repeat CBC, Aranesp 200 mcg, likely transfusion. 5. Metabolic bone disease - Ca 9.7 (9.9 corrected), P 1.4; Calcitriol 0.25 mcg bid, Sensipar 30 mg qd, Phoslo 3 with meals.  Hold Phoslo. 6. Nutrition - Alb 3.7, renal panel. 7. Hepatitis B 8. HIV - on antivirals, followed by Dr. Johnnye Sima.  Vowinckel D 01/09/2013, 1:51 PM   Attending Nephrologist: [name], MD  Patient seen and examined.  Agree with assessment and plan as above. 57 yo with ESRD due to HTN on home hemodialysis presents with SOB and mild pulmonary edema. Resp symptoms likely due to volume excess. He is 6kg over his dry weight, plan HD today and possibly tomorrow to get volume off.  Hb is down, probably because his EPO was stopped about a month ago and hasn't been restarted.  No signs of active bleeding.  No heparin with HD today and will transfuse 2 units and restart ESA with darbepoeitin 150 ug today and resume EPO at home at 75% or prior dose.  Jeffrey Splinter  MD [phone] pgr    [phone] cell 01/09/2013, 1:51 PM

## 2013-01-09 NOTE — ED Notes (Signed)
Report given to floor. Pt transported via stretcher.  nadn.

## 2013-01-09 NOTE — Progress Notes (Signed)
CRITICAL VALUE ALERT  Critical value received:  Hgb 6.7  Date of notification:  01/09/13  Time of notification:  10:30  Critical value read back:yes  Nurse who received alert: Starr Lake  MD notified (1st page):  Dr. Juleen China  Time of first page: 11:05  MD notified (2nd page): N/A  Time of second page:  N/A  Responding MD: Dr. Juleen China  Time MD responded:  11:05

## 2013-01-10 DIAGNOSIS — I10 Essential (primary) hypertension: Secondary | ICD-10-CM | POA: Diagnosis not present

## 2013-01-10 DIAGNOSIS — J810 Acute pulmonary edema: Secondary | ICD-10-CM | POA: Diagnosis not present

## 2013-01-10 DIAGNOSIS — N186 End stage renal disease: Secondary | ICD-10-CM | POA: Diagnosis not present

## 2013-01-10 DIAGNOSIS — E8779 Other fluid overload: Secondary | ICD-10-CM | POA: Diagnosis not present

## 2013-01-10 LAB — TYPE AND SCREEN
ABO/RH(D): O NEG
Antibody Screen: NEGATIVE
Unit division: 0

## 2013-01-10 LAB — RENAL FUNCTION PANEL
CO2: 28 mEq/L (ref 19–32)
Chloride: 92 mEq/L — ABNORMAL LOW (ref 96–112)
GFR calc Af Amer: 5 mL/min — ABNORMAL LOW (ref >90)
GFR calc non Af Amer: 4 mL/min — ABNORMAL LOW (ref >90)
Potassium: 4.1 mEq/L (ref 3.5–5.1)
Sodium: 134 mEq/L — ABNORMAL LOW (ref 135–145)

## 2013-01-10 LAB — CBC
Platelets: 81 10*3/uL — ABNORMAL LOW (ref 150–400)
RBC: 2.62 MIL/uL — ABNORMAL LOW (ref 4.22–5.81)
WBC: 6.1 10*3/uL (ref 4.0–10.5)

## 2013-01-10 LAB — IRON AND TIBC

## 2013-01-10 MED ORDER — HEPARIN SODIUM (PORCINE) 1000 UNIT/ML DIALYSIS: 1000.0000 [IU] | INTRAMUSCULAR | Status: DC | PRN

## 2013-01-10 MED ORDER — ALPRAZOLAM 0.5 MG PO TABS
ORAL_TABLET | ORAL | Status: AC
  Filled 2013-01-10: qty 1

## 2013-01-10 MED ORDER — HEPARIN SODIUM (PORCINE) 1000 UNIT/ML DIALYSIS: 6000.0000 [IU] | Freq: Once | INTRAMUSCULAR | Status: DC

## 2013-01-10 MED ORDER — ALBUTEROL SULFATE HFA 108 (90 BASE) MCG/ACT IN AERS
2.0000 | INHALATION_SPRAY | RESPIRATORY_TRACT | Status: DC | PRN
  Filled 2013-01-10: qty 6.7

## 2013-01-10 MED ORDER — ALBUTEROL SULFATE (5 MG/ML) 0.5% IN NEBU
2.5000 mg | INHALATION_SOLUTION | Freq: Four times a day (QID) | RESPIRATORY_TRACT | Status: DC
  Filled 2013-01-10: qty 0.5

## 2013-01-10 MED ORDER — ALBUTEROL SULFATE HFA 108 (90 BASE) MCG/ACT IN AERS: 2.0000 | INHALATION_SPRAY | RESPIRATORY_TRACT | Status: DC | PRN

## 2013-01-10 MED ORDER — PENTAFLUOROPROP-TETRAFLUOROETH EX AERO: 1.0000 "application " | INHALATION_SPRAY | CUTANEOUS | Status: DC | PRN

## 2013-01-10 MED ORDER — ALTEPLASE 2 MG IJ SOLR: 2.0000 mg | Freq: Once | INTRAMUSCULAR | Status: DC | PRN

## 2013-01-10 MED ORDER — LIDOCAINE HCL (PF) 1 % IJ SOLN: 5.0000 mL | INTRAMUSCULAR | Status: DC | PRN

## 2013-01-10 MED ORDER — NEPRO/CARBSTEADY PO LIQD: 237.0000 mL | ORAL | Status: DC | PRN

## 2013-01-10 MED ORDER — SODIUM CHLORIDE 0.9 % IV SOLN: 100.0000 mL | INTRAVENOUS | Status: DC | PRN

## 2013-01-10 MED ORDER — IPRATROPIUM BROMIDE 0.02 % IN SOLN
0.5000 mg | Freq: Four times a day (QID) | RESPIRATORY_TRACT | Status: DC
  Filled 2013-01-10: qty 2.5

## 2013-01-10 MED ORDER — LIDOCAINE-PRILOCAINE 2.5-2.5 % EX CREA: 1.0000 "application " | TOPICAL_CREAM | CUTANEOUS | Status: DC | PRN

## 2013-01-10 NOTE — Procedures (Signed)
I was present at this dialysis session. I have reviewed the session itself and made appropriate changes.   Kelly Splinter, MD [employer] 01/10/2013, 3:30 PM

## 2013-01-10 NOTE — Progress Notes (Signed)
INTERNAL MEDICINE TEACHING SERVICE Attending Note  Date: 01/10/2013  Patient name: Jeffrey [name]  Medical record number: [id number]  Date of birth: August 13, 1956    This patient has been seen and discussed with the house staff. Please see their note for complete details. I concur with their findings with the following additions/corrections: He feels much better this morning.  CXR was repeated and shows the right suprahilar fullness is less apparent with resolved pulmonary venous congestion s/p HD.  He can have this CXR further repeated as an outpatient by his PCP, but this likely represented venous congestion due to volume overload. He nephrologist has been contacted to facilitate evaluation of his home HD machine.  He will also need to be seen for ESA administration by his nephrologist with iron panel review. He is s/p 2 Units of PRBCs and has tolerated this. There is no evidence of bleeding. He is stable for discharge from a medical standpoint with follow up.   The treatment plan was discussed in detail with the patient.  Alternatives to treatment, side effects, risks and benefits, and complications were discussed with the patient. Informed consent was obtained. The patient agrees to proceed with the current treatment plan.  Dominic Pea, DO  01/10/2013, 1:49 PM

## 2013-01-10 NOTE — Progress Notes (Signed)
Subjective: No SOB, breathing better  Objective Vital signs in last 24 hours: Filed Vitals:   01/10/13 0900 01/10/13 0930 01/10/13 1000 01/10/13 1035  BP: 171/92 156/85 152/89 178/90  Pulse: 82 83 81 83  Temp:    97.2 F (36.2 C)  TempSrc:    Oral  Resp: 19 20 22 25   Height:      Weight:    92.5 kg (203 lb 14.8 oz)  SpO2:    98%   Weight change: 0.8 kg (1 lb 12.2 oz)  Intake/Output Summary (Last 24 hours) at 01/10/13 1527 Last data filed at 01/10/13 1035  Gross per 24 hour  Intake    870 ml  Output   6650 ml  Net  -5780 ml   Labs: Basic Metabolic Panel:  Recent Labs Lab 01/08/13 2215 01/09/13 1015 01/10/13 0706  NA 125* 125* 134*  K 5.5* 5.1 4.1  CL 85* 84* 92*  CO2 27 24 28   GLUCOSE 94 199* 101*  BUN 106* 116* 70*  CREATININE 16.69* 17.04* 11.69*  CALCIUM 9.9 9.7 8.5  PHOS  --  1.4* 5.8*   Liver Function Tests:  Recent Labs Lab 01/08/13 2215 01/09/13 1015 01/10/13 0706  AST 16  --   --   ALT 8  --   --   ALKPHOS 65  --   --   BILITOT 0.3  --   --   PROT 9.5*  --   --   ALBUMIN 4.0 3.7 3.4*   No results found for this basename: LIPASE, AMYLASE,  in the last 168 hours No results found for this basename: AMMONIA,  in the last 168 hours CBC:  Recent Labs Lab 01/08/13 2215 01/09/13 1015 01/09/13 1320 01/10/13 0706  WBC 7.8 6.5 6.3 6.1  NEUTROABS 5.1  --   --   --   HGB 7.3* 6.7* 6.4* 7.3*  HCT 20.8* 18.7* 17.8* 20.5*  MCV 79.7 78.2 77.7* 78.2  PLT 86* 84* 81* 81*   PT/INR: @LABRCNTIP (inr:5)   Scheduled Meds ) . albuterol  2.5 mg Nebulization Q6H   And  . ipratropium  0.5 mg Nebulization Q6H  . ALPRAZolam      . amLODipine  10 mg Oral BID  . calcitRIOL  0.25 mcg Oral BID  . carvedilol  12.5 mg Oral BID WC  . cinacalcet  30 mg Oral Q breakfast  . hydrALAZINE  25 mg Oral Q8H  . lamiVUDine  25 mg Oral Daily  . multivitamin  1 tablet Oral QHS  . polyethylene glycol  17 g Oral Daily  . raltegravir  400 mg Oral BID  . sodium chloride   3 mL Intravenous Q12H  . sodium chloride  3 mL Intravenous Q12H  . [START ON 01/14/2013] tenofovir  300 mg Oral Q7 days  . traZODone  50 mg Oral QHS    Physical Exam:  Blood pressure 178/90, pulse 83, temperature 97.2 F (36.2 C), temperature source Oral, resp. rate 25, height 6' (1.829 m), weight 92.5 kg (203 lb 14.8 oz), SpO2 98.00%.  General appearance: alert, cooperative and no distress  Head: Normocephalic, without obvious abnormality, atraumatic  Neck: no adenopathy, no carotid bruit, no JVD and supple, symmetrical, trachea midline  Resp: clear to auscultation bilaterally  Cardio: regular rate and rhythm, S1, S2 normal, no murmur, click, rub or gallop  GI: soft, non-tender; bowel sounds normal; no masses, no organomegaly  Extremities: extremities normal, atraumatic, no cyanosis or edema  Neurologic: Grossly normal  Dialysis  Access: AVF @ LFA with + bruit   Assessment/Plan:  1. Dyspnea / volume excess- improving with HD. OK for discharge today.  2. ESRD - on NxStage at home; 4 days a week; followed at [facility]; ran on 4/18 & 19. HD pending. 3. Hypertension/volume - BP 188/99 on Amlodipine 10 mg qd, Hydralazine 25 mg q8h. Dry weight was 98 kg, down to 92.5 kg after HD today. Dry weight should be further lowered progressively by pt until new EDW established over the next 5-10 days at home. BP should improve, BP meds may need to be stopped. Explained in detail to patient these concepts.  4. Anemia - Hgb down to 6.7, 7.3 yesterday, previously 12.8 on 3/17; outpatient Epogen on hold. Repeat CBC, Aranesp 200 mcg, likely transfusion. 5. Metabolic bone disease - Ca 9.7 (9.9 corrected), P 1.4; Calcitriol 0.25 mcg bid, Sensipar 30 mg qd, Phoslo 3 with meals. Hold Phoslo. 6. Nutrition - Alb 3.7, renal panel. 7. Hepatitis B 8. HIV - on antivirals, followed by Dr. Johnnye Sima.   Kelly Splinter  MD [phone] pgr    [phone] cell 01/10/2013, 3:27 PM

## 2013-01-10 NOTE — Procedures (Signed)
I was present at this dialysis session. I have reviewed the session itself and made appropriate changes.   Kelly Splinter, MD [employer] 01/10/2013, 1:44 PM

## 2013-01-10 NOTE — Progress Notes (Signed)
Subjective:    Interval Events:  Feels better with his breathing.  Still worried about wheezing.  He has run out of albuterol at home.  Denies bleeding, melena, and hematochezia.     Objective:    Vital Signs:   Filed Vitals:   01/09/13 1819 01/09/13 2302 01/10/13 0649 01/10/13 0702  BP: 176/80 162/85 166/103 156/94  Pulse: 98 86 90 85  Temp: 98.6 F (37 C) 98.7 F (37.1 C) 96.7 F (35.9 C)   TempSrc: Oral Oral Oral Oral  Resp: 18 18 25 16   Height:  6' (1.829 m)    Weight:  215 lb 1.6 oz (97.569 kg) 213 lb 13.5 oz (97 kg)   SpO2: 96% 95% 95%     Weights: 24-hour Weight change: 1 lb 12.2 oz (0.8 kg)  Filed Weights   01/09/13 1215 01/09/13 2302 01/10/13 0649  Weight: 217 lb 6 oz (98.6 kg) 215 lb 1.6 oz (97.569 kg) 213 lb 13.5 oz (97 kg)    Intake/Output:   Gross per 24 hour  Intake   1604 ml  Output   2650 ml  Net  -1046 ml      Last BM Date: 01/07/13   Physical Exam: GENERAL: alert and oriented; resting comfortably in bed and in no distress EYES: pupils equal, round, and reactive to light; sclera anicteric ENT: moist mucosa LUNGS: scattered expiratory wheezes, normal work of breathing HEART: normal rate; regular rhythm ABDOMEN: soft, non-tender, normal bowel sounds, no masses palpated    Labs: Basic Metabolic Panel: Lab [id number] 2215 01/09/13 1015 01/10/13 0706  NA 125* 125* 134*  K 5.5* 5.1 4.1  CL 85* 84* 92*  CO2 27 24 28   GLUCOSE 94 199* 101*  BUN 106* 116* 70*  CREATININE 16.69* 17.04* 11.69*  CALCIUM 9.9 9.7 8.5  PHOS  --  1.4* 5.8*    Liver Function Tests: Lab 01/08/13 2215 01/09/13 1015 01/10/13 0706  AST 16  --   --   ALT 8  --   --   ALKPHOS 65  --   --   BILITOT 0.3  --   --   PROT 9.5*  --   --   ALBUMIN 4.0 3.7 3.4*    CBC: Lab 01/08/13 2215 01/09/13 1015 01/09/13 1320 01/10/13 0706  WBC 7.8 6.5 6.3 6.1  NEUTROABS 5.1  --   --   --   HGB 7.3* 6.7* 6.4* 7.3*  HCT 20.8* 18.7* 17.8* 20.5*  MCV 79.7 78.2 77.7*  78.2  PLT 86* 84* 81* 81*     Other results: Imaging: Dg Chest 2 View 01/09/2013   FINDINGS: Midline trachea.  Moderate cardiomegaly.  The right suprahilar soft tissue fullness is less apparent today.  A small left pleural effusion is not significantly changed. No pneumothorax.  Improved to resolved pulmonary venous congestion. Mild scarring at the right lung base laterally.  Patchy left base atelectasis which is improved.   IMPRESSION: Cardiomegaly with improved to resolved pulmonary venous congestion.  Similar small left pleural effusion.      Medications:    Infusions:     Scheduled Medications: . albuterol  2.5 mg Nebulization Q6H   And  . ipratropium  0.5 mg Nebulization Q6H  . ALPRAZolam      . amLODipine  10 mg Oral BID  . calcitRIOL  0.25 mcg Oral BID  . carvedilol  12.5 mg Oral BID WC  . cinacalcet  30 mg Oral Q breakfast  . [START ON 01/11/2013]  heparin  6,000 Units Dialysis Once in dialysis  . hydrALAZINE  25 mg Oral Q8H  . lamiVUDine  25 mg Oral Daily  . multivitamin  1 tablet Oral QHS  . polyethylene glycol  17 g Oral Daily  . raltegravir  400 mg Oral BID  . sodium chloride  3 mL Intravenous Q12H  . sodium chloride  3 mL Intravenous Q12H  . [START ON 01/14/2013] tenofovir  300 mg Oral Q7 days  . traZODone  50 mg Oral QHS     PRN Medications: sodium chloride, sodium chloride, sodium chloride, acetaminophen, acetaminophen, ALPRAZolam, alteplase, feeding supplement (NEPRO CARB STEADY), heparin, hydrALAZINE, lidocaine (PF), lidocaine-prilocaine, pentafluoroprop-tetrafluoroeth, sodium chloride    Assessment/ Plan:    1.   ESRD on HD:  Patient came in short of breath and clinically fluid overloaded. ProBNP was 9909 and troponin level was negative. Chest x-ray consistent with congestion and pulmonary edema. Echocardiogram was unremarkable. With dialysis on 4/21 and this morning, 4/22; his dyspnea is much improved. He says his machine at home was not working properly.  It was dialyzing but slowly. I have confirmed that a staff member from the [facility] dialysis center will be visiting his house tomorrow to investigate this. He has an appointment with a physician at the [facility] on Monday, April 28. Dr. Jonnie Finner of nephrology he is okay with discharge to home today.   2.   Anemia:  Patient came in with a hemoglobin of 10.4. This dropped to 7.3 and then 6.7. He was transfused 2 units of packed red blood cells during dialysis on 4/21. His hemoglobin improved to 7.3 on 4/22. It seems he was on Epogen as an outpatient previously but his hemoglobin increased to greater than 12. Epogen was held and he is now anemic. Dr. Jonnie Finner believes he needs to be on Epogen at 75% of the previous dose.  He did receive 150 mcg of darbepoetin here with dialysis.  Iron panel on 4/22 revealed: Iron 192, UIBC < 15, ferritin 316.  3.   Chronic thrombocytopenia:  Stable in the 80s.  4.   HIV:  Stable. No change to his antiretroviral therapy.  5.   HBV: Stable. Tested positive for HBV surface antigen in 2012. HBV quantitative DNA was elevated when checked on Dec 16, 202012.  6.   HTN:  Blood pressure elevated here in the 160s/80s. On his home regimen of amlodipine 10 mg twice a day, carvedilol 12.5 mg twice a day, and hydralazine 25 mg 3 times a day.  7.   Prophylaxis:  SCDs  8.   Disposition:  Discharge home today. Will need followup with Dr. Johnnye Sima soon. Will follow with nephrology on Monday, April 28.    Length of Stay: 2 days   Signed by:  Shann Medal. Juleen China, MD PGY-I, Internal Medicine Pager [phone]  01/10/2013, 7:35 AM

## 2013-01-10 NOTE — Discharge Summary (Signed)
Patient Name:  Jeffrey [name] MRN: [id number]  PCP: Campbell Riches, MD DOB:  09-06-1956       Date of Admission:  01/08/2013  Date of Discharge:  01/10/2013      Attending Physician: Dominic Pea, DO         DISCHARGE DIAGNOSES: 1. ESRD on HD 2. Anemia of CKD 3. Chronic thrombocytopenia 4. HIV 5. Chronic HBV 6. Hypertension    DISPOSITION AND FOLLOW-UP: Jeffrey [name] is to follow-up with the listed providers as detailed below, at which time, the following should be addressed:  1. Follow-up visits: 1. ANEMIA:  Dr. Jonnie Finner recommends going back on Epogen as an outpatient at 75% of his former dose.  He received 2 units of blood here for hemoglobin to 6.7. Iron and ferritin normal. 2. ESRD:  Patient says his home unit was not running at its normal rate. Staff member from dialysis center will visit his home on 4/23 to evaluate. He has followup with nephrology on Monday, April 28. Please ensure adequacy of his home dialysis sessions.  2. Labs and images needed: 1. CBC 2. PFTs:  Patient may have early stages of COPD. He was a smoker in the past. We sent him home with an albuterol MDI, but PFTs would help optimize treatment.  3. Pending labs and tests needing follow-up:  NONE   Follow-up Information   Schedule an appointment as soon as possible for a visit with Bobby Rumpf, MD.   Contact information:   [place] [place]  [address] [place] [address] [phone]       Follow up with [facility] On 01/16/2013.   Contact information:   Dialysis center on [facility]. [id number]     Discharge Orders   Future Appointments Provider Department Dept Phone   04/24/2013 10:00 AM [facility] for [facility] Disease [phone]   05/08/2013 10:00 AM Campbell Riches, MD [facility] for Infectious Disease [phone]   Future Orders Complete By Expires     Call MD for:  difficulty breathing,  headache or visual disturbances  As directed     Diet - low sodium heart healthy  As directed     Discharge instructions  As directed     Comments:      Dialyze tomorrow, targeting a weight of 92 kg    Increase activity slowly  As directed         DISCHARGE MEDICATIONS:   Medication List    TAKE these medications       albuterol 108 (90 BASE) MCG/ACT inhaler  Commonly known as:  PROVENTIL HFA;VENTOLIN HFA  Inhale 2 puffs into the lungs every 4 (four) hours as needed. For shortness of breath     ALPRAZolam 0.5 MG tablet  Commonly known as:  XANAX  Take 1 tablet (0.5 mg total) by mouth 2 (two) times daily as needed for sleep.     amLODipine 10 MG tablet  Commonly known as:  NORVASC  Take 10 mg by mouth 2 (two) times daily.     calcitRIOL 0.25 MCG capsule  Commonly known as:  ROCALTROL  Take 0.25 mcg by mouth 2 (two) times daily.     Calcium Acetate 667 MG Tabs  Take 1,334-2,001 mg by mouth 3 (three) times daily with meals. Takes 3 capsules with meals and 2 capsules with snacks     carvedilol 12.5 MG tablet  Commonly known as:  COREG  Take 1 tablet (12.5 mg total) by mouth 2 (two) times daily with a meal.     cinacalcet 30 MG tablet  Commonly known as:  SENSIPAR  Take 30 mg by mouth daily.     hydrALAZINE 25 MG tablet  Commonly known as:  APRESOLINE  Take 1 tablet (25 mg total) by mouth 3 (three) times daily.     lamivudine 5 MG/ML solution  Commonly known as:  EPIVIR-HBV  Take 25 mg by mouth daily.     multivitamin Tabs tablet  Take 1 tablet by mouth daily.     raltegravir 400 MG tablet  Commonly known as:  ISENTRESS  Take 1 tablet (400 mg total) by mouth 2 (two) times daily.     tenofovir 300 MG tablet  Commonly known as:  VIREAD  Take 300 mg by mouth every 7 (seven) days. On Saturdays     traZODone 50 MG tablet  Commonly known as:  DESYREL  Take 1 tablet (50 mg total) by mouth at bedtime.         CONSULTS: 1.   Nephrology   PROCEDURES  PERFORMED:  Dg Chest 2 View 01/09/2013   FINDINGS: Midline trachea.  Moderate cardiomegaly.  The right suprahilar soft tissue fullness is less apparent today.  A small left pleural effusion is not significantly changed. No pneumothorax.  Improved to resolved pulmonary venous congestion. Mild scarring at the right lung base laterally.  Patchy left base atelectasis which is improved.  IMPRESSION: Cardiomegaly with improved to resolved pulmonary venous congestion.  Similar small left pleural effusion.  Dg Chest 2 View 01/08/2013   FINDINGS: Midline trachea.  Moderate cardiomegaly.  Development of small left pleural effusion. No pneumothorax.  Pulmonary interstitial thickening is lower lobe predominant and similar to on the prior exam.  Felt to be slightly increased since 08/27/2011.  Areas of scarring are lower lobe predominant and greater on the right than left. No lobar consolidation.   The right superior hilar region is mildly prominent, similar to on the prior exam but new or increased since the 2008 study.  IMPRESSION: Cardiomegaly with lower lobe predominant interstitial thickening. This is similar to on the prior exam, and remains suspicious for pulmonary venous congestion.  Development of a small left pleural effusion.  Suggestion of right suprahilar soft tissue fullness. Possibly related to venous hypertension.  Consider follow-up radiographs after appropriate diuresis with special attention to this area.  2-D echocardiogram with contrast 01/09/2013 CONCLUSIONS:  1. Mildly dilated LV with EF 60-65%, no wall motion abnormalities. 2. Trivial AV regurgitation. 3. Mild MV regurgitation. 4. Moderately dilated left atrium. 5. Mildly dilated RA.      ADMISSION DATA: H&P: 57 y.o male PMH HIV/HBV, HTN, anemia/thrombocytopenia, ESRD on home HD 3-4 times weekly via left forearm fistula. He presents for sob and lower extremity edema. Shortness of breath has been worsening since Saturday prior to admission.  SOB is at rest. Nothing made worse. A breathing treatment in the ED and sitting up helps sob. He did not try anything at home. He reports lower extremity edema since 12/20/12 at ED visit. He states his leg swelling has been fluctuating with HD. He had HD 4 times this week (normally does HD 3-4 x per week) with the last session Saturday. He states he is having a problem with his machine at home. He was able to take of 2 L on Saturday despite machine problems. Other review of symptoms: decreased appetite, per patient dry weight 92  kg but last note states 97 kg is dry weight per renal, dry cough, decreased sleep due to breathing difficulty.    Physical Exam: Vitals: 87, 98% on 2-3 L [place], 148/90 (105)  Blood pressure 157/90, pulse 85, temperature 98.4 F (36.9 C), temperature source Oral, resp. rate 15, SpO2 99.00%.  Vitals reviewed.  General: resting in bed, NAD  HEENT: PERRL b/l, no scleral icterus, no teeth, difficult to access JVD due to body habitus  Cardiac: RRR, no rubs, murmurs or gallops  Pulm: min. Crackles at bases b/l  Abd: soft, nontender, nondistended, BS present, obese  Ext: warm and well perfused, 2+ lower extremity edema b/l; left forearm fistula with bruits x 2  Neuro: alert and oriented X3, neurologically intact, moving all 4 extremities    Labs: Basic Metabolic Panel:    [id number] 2215   NA  125*   K  5.5*   CL  85*   CO2  27   GLUCOSE  94   BUN  106*   CREATININE  16.69*   CALCIUM  9.9     Liver Function Tests:   01/08/13 2215   AST  16   ALT  8   ALKPHOS  65   BILITOT  0.3   PROT  9.5*   ALBUMIN  4.0     CBC:   01/08/13 2215   WBC  7.8   NEUTROABS  5.1   HGB  7.3*   HCT  20.8*   MCV  79.7   PLT  86*     Cardiac Enzymes:   01/09/13 0015   TROPONINI  <0.30     BNP:   01/09/13 0015   PROBNP  9909.0*     Urine Drug Screen:     Component  Value  Date/Time    LABOPIA  NONE DETECTED  06/02/2011 0948    COCAINSCRNUR  POSITIVE*  06/02/2011 0948     LABBENZ  NONE DETECTED  06/02/2011 0948    AMPHETMU  NONE DETECTED  06/02/2011 0948    THCU  NONE DETECTED  06/02/2011 0948    LABBARB  NONE DETECTED  06/02/2011 0948     HOSPITAL COURSE: 1.   ESRD on HD:  Patient came in short of breath and clinically fluid overloaded. ProBNP was 9909 and troponin level was negative. Chest x-ray consistent with congestion and pulmonary edema. Echocardiogram was unremarkable. With dialysis on 4/21 and 4/22. his dyspnea was much improved. He says his machine at home was not working properly. It was dialyzing but slowly. I have confirmed that a staff member from the [facility] dialysis center will be visiting his house tomorrow to investigate this. He has an appointment with a physician at the [facility] on Monday, April 28. Dr. Jonnie Finner of nephrology says is okay with discharge to home today.  2.   Anemia:  Patient came in with a hemoglobin of 10.4. This dropped to 7.3 and then 6.7. He was transfused 2 units of packed red blood cells during dialysis on 4/21. His hemoglobin improved to 7.3 on 4/22. It seems he was on Epogen as an outpatient previously but his hemoglobin increased to greater than 12. Epogen was held, and he is now anemic. Dr. Jonnie Finner believes he needs to be on Epogen at 75% of the previous dose. He did receive 150 mcg of darbepoetin here with dialysis. Iron panel on 4/22 revealed: Iron 192, UIBC < 15, ferritin 316.  3.   Chronic thrombocytopenia:  Stable in the 80s.  4.   HIV:  Stable. No change to his antiretroviral therapy.  5.   HBV:  Stable. Tested positive for HBV surface antigen in 2012. HBV quantitative DNA was elevated when checked on Feb 10, 202012.  6.   HTN:  Blood pressure elevated here in the 160s/80s. On his home regimen of amlodipine 10 mg twice a day, carvedilol 12.5 mg twice a day, and hydralazine 25 mg 3 times a day.   DISCHARGE DATA: Vital Signs: BP 178/90  Pulse 83  Temp(Src) 97.2 F (36.2 C) (Oral)  Resp 25   Ht 6' (1.829 m)  Wt 203 lb 14.8 oz (92.5 kg)  BMI 27.65 kg/m2  SpO2 98%  Labs: Results for orders placed during the hospital encounter of 01/08/13 (from the past 24 hour(s))  FERRITIN     Status: None   Collection Time    01/10/13  7:06 AM      Result Value Range   Ferritin 316  22 - 322 ng/mL  IRON AND TIBC     Status: Abnormal   Collection Time    01/10/13  7:06 AM      Result Value Range   Iron 192 (*) 42 - 135 ug/dL   TIBC NOT CALC  215 - 435 ug/dL   Saturation Ratios NOT CALC  20 - 55 %   UIBC <15 (*) 125 - 400 ug/dL  CBC     Status: Abnormal   Collection Time    01/10/13  7:06 AM      Result Value Range   WBC 6.1  4.0 - 10.5 K/uL   RBC 2.62 (*) 4.22 - 5.81 MIL/uL   Hemoglobin 7.3 (*) 13.0 - 17.0 g/dL   HCT 20.5 (*) 39.0 - 52.0 %   MCV 78.2  78.0 - 100.0 fL   MCH 27.9  26.0 - 34.0 pg   MCHC 35.6  30.0 - 36.0 g/dL   RDW 17.3 (*) 11.5 - 15.5 %   Platelets 81 (*) 150 - 400 K/uL  RENAL FUNCTION PANEL     Status: Abnormal   Collection Time    01/10/13  7:06 AM      Result Value Range   Sodium 134 (*) 135 - 145 mEq/L   Potassium 4.1  3.5 - 5.1 mEq/L   Chloride 92 (*) 96 - 112 mEq/L   CO2 28  19 - 32 mEq/L   Glucose, Bld 101 (*) 70 - 99 mg/dL   BUN 70 (*) 6 - 23 mg/dL   Creatinine, Ser 11.69 (*) 0.50 - 1.35 mg/dL   Calcium 8.5  8.4 - 10.5 mg/dL   Phosphorus 5.8 (*) 2.3 - 4.6 mg/dL   Albumin 3.4 (*) 3.5 - 5.2 g/dL   GFR calc non Af Amer 4 (*) >90 mL/min   GFR calc Af Amer 5 (*) >90 mL/min     Time spent on discharge: 40 minutes   Services Ordered on Discharge: 1. PT - no 2. OT - no 3. RN - no 4. Other - no   Signed by:  Shann Medal. Juleen China, MD PGY-I, Internal Medicine  01/10/2013, 2:25 PM

## 2013-01-11 ENCOUNTER — Telehealth (HOSPITAL_COMMUNITY): Payer: Self-pay | Admitting: Emergency Medicine

## 2013-01-11 DIAGNOSIS — N186 End stage renal disease: Secondary | ICD-10-CM | POA: Diagnosis not present

## 2013-01-11 NOTE — ED Notes (Signed)
Call from [facility] w/critical report.  Pt was admitted given the # for Dr Johnnye Sima to give report to.

## 2013-01-11 NOTE — Discharge Summary (Signed)
INTERNAL MEDICINE TEACHING SERVICE Attending Note  Date: 01/11/2013  Patient name: Jeffrey [name]  Medical record number: [id number]  Date of birth: 1956/04/19    This patient has been seen and discussed with the house staff. Please see their note for complete details. I concur with their findings and plan.   Dominic Pea, DO  01/11/2013, 3:59 PM

## 2013-01-12 LAB — CULTURE, BLOOD (ROUTINE X 2)

## 2013-01-16 LAB — CULTURE, BLOOD (ROUTINE X 2): Culture: NO GROWTH

## 2013-01-19 DIAGNOSIS — N186 End stage renal disease: Secondary | ICD-10-CM | POA: Diagnosis not present

## 2013-01-19 DIAGNOSIS — D631 Anemia in chronic kidney disease: Secondary | ICD-10-CM | POA: Diagnosis not present

## 2013-01-19 DIAGNOSIS — D509 Iron deficiency anemia, unspecified: Secondary | ICD-10-CM | POA: Diagnosis not present

## 2013-02-18 DIAGNOSIS — N186 End stage renal disease: Secondary | ICD-10-CM | POA: Diagnosis not present

## 2013-02-20 DIAGNOSIS — N186 End stage renal disease: Secondary | ICD-10-CM | POA: Diagnosis not present

## 2013-02-20 DIAGNOSIS — I120 Hypertensive chronic kidney disease with stage 5 chronic kidney disease or end stage renal disease: Secondary | ICD-10-CM | POA: Diagnosis not present

## 2013-02-20 DIAGNOSIS — G7000 Myasthenia gravis without (acute) exacerbation: Secondary | ICD-10-CM | POA: Diagnosis not present

## 2013-02-20 DIAGNOSIS — R5381 Other malaise: Secondary | ICD-10-CM | POA: Diagnosis not present

## 2013-02-20 DIAGNOSIS — I953 Hypotension of hemodialysis: Secondary | ICD-10-CM | POA: Diagnosis not present

## 2013-02-20 DIAGNOSIS — D631 Anemia in chronic kidney disease: Secondary | ICD-10-CM | POA: Diagnosis not present

## 2013-02-20 DIAGNOSIS — D509 Iron deficiency anemia, unspecified: Secondary | ICD-10-CM | POA: Diagnosis not present

## 2013-03-20 DIAGNOSIS — N186 End stage renal disease: Secondary | ICD-10-CM | POA: Diagnosis not present

## 2013-03-21 DIAGNOSIS — N186 End stage renal disease: Secondary | ICD-10-CM | POA: Diagnosis not present

## 2013-03-21 DIAGNOSIS — D631 Anemia in chronic kidney disease: Secondary | ICD-10-CM | POA: Diagnosis not present

## 2013-03-21 DIAGNOSIS — D509 Iron deficiency anemia, unspecified: Secondary | ICD-10-CM | POA: Diagnosis not present

## 2013-03-21 DIAGNOSIS — I120 Hypertensive chronic kidney disease with stage 5 chronic kidney disease or end stage renal disease: Secondary | ICD-10-CM | POA: Diagnosis not present

## 2013-03-21 DIAGNOSIS — R5381 Other malaise: Secondary | ICD-10-CM | POA: Diagnosis not present

## 2013-03-21 DIAGNOSIS — G7000 Myasthenia gravis without (acute) exacerbation: Secondary | ICD-10-CM | POA: Diagnosis not present

## 2013-03-29 DIAGNOSIS — E039 Hypothyroidism, unspecified: Secondary | ICD-10-CM | POA: Diagnosis not present

## 2013-04-05 ENCOUNTER — Telehealth: Payer: Self-pay | Admitting: *Deleted

## 2013-04-05 DIAGNOSIS — F419 Anxiety disorder, unspecified: Secondary | ICD-10-CM

## 2013-04-05 MED ORDER — TRAZODONE HCL 50 MG PO TABS: 50.0000 mg | ORAL_TABLET | Freq: Every day | ORAL | Status: DC

## 2013-04-05 NOTE — Telephone Encounter (Signed)
Pt requested refill

## 2013-04-18 ENCOUNTER — Other Ambulatory Visit: Payer: Self-pay | Admitting: Licensed Clinical Social Worker

## 2013-04-18 DIAGNOSIS — B20 Human immunodeficiency virus [HIV] disease: Secondary | ICD-10-CM

## 2013-04-18 MED ORDER — LAMIVUDINE 5 MG/ML PO SOLN: 25.0000 mg | Freq: Every day | ORAL | Status: DC

## 2013-04-20 DIAGNOSIS — N186 End stage renal disease: Secondary | ICD-10-CM | POA: Diagnosis not present

## 2013-04-21 DIAGNOSIS — N186 End stage renal disease: Secondary | ICD-10-CM | POA: Diagnosis not present

## 2013-04-21 DIAGNOSIS — D631 Anemia in chronic kidney disease: Secondary | ICD-10-CM | POA: Diagnosis not present

## 2013-04-21 DIAGNOSIS — D509 Iron deficiency anemia, unspecified: Secondary | ICD-10-CM | POA: Diagnosis not present

## 2013-04-22 DIAGNOSIS — N186 End stage renal disease: Secondary | ICD-10-CM | POA: Diagnosis not present

## 2013-04-22 DIAGNOSIS — D509 Iron deficiency anemia, unspecified: Secondary | ICD-10-CM | POA: Diagnosis not present

## 2013-04-22 DIAGNOSIS — D631 Anemia in chronic kidney disease: Secondary | ICD-10-CM | POA: Diagnosis not present

## 2013-04-24 ENCOUNTER — Telehealth: Payer: Self-pay | Admitting: Internal Medicine

## 2013-04-24 ENCOUNTER — Telehealth: Payer: Self-pay | Admitting: *Deleted

## 2013-04-24 ENCOUNTER — Other Ambulatory Visit: Payer: Medicare Other

## 2013-04-24 DIAGNOSIS — B20 Human immunodeficiency virus [HIV] disease: Secondary | ICD-10-CM | POA: Diagnosis not present

## 2013-04-24 DIAGNOSIS — N186 End stage renal disease: Secondary | ICD-10-CM | POA: Diagnosis not present

## 2013-04-24 DIAGNOSIS — D509 Iron deficiency anemia, unspecified: Secondary | ICD-10-CM | POA: Diagnosis not present

## 2013-04-24 DIAGNOSIS — D631 Anemia in chronic kidney disease: Secondary | ICD-10-CM | POA: Diagnosis not present

## 2013-04-24 LAB — CBC
HCT: 31.9 % — ABNORMAL LOW (ref 39.0–52.0)
MCHC: 33.9 g/dL (ref 30.0–36.0)
RDW: 16.9 % — ABNORMAL HIGH (ref 11.5–15.5)

## 2013-04-24 LAB — COMPREHENSIVE METABOLIC PANEL
ALT: 10 U/L (ref 0–53)
AST: 14 U/L (ref 0–37)
Albumin: 4.4 g/dL (ref 3.5–5.2)
Calcium: 9.1 mg/dL (ref 8.4–10.5)
Chloride: 91 mEq/L — ABNORMAL LOW (ref 96–112)
Creat: 13.19 mg/dL (ref 0.50–1.35)
Potassium: 4 mEq/L (ref 3.5–5.3)

## 2013-04-24 NOTE — Telephone Encounter (Signed)
Called patient to advise him of doctors response had to leave a message for him to call the office and will tell him to make appt with his PCP asap.

## 2013-04-24 NOTE — Telephone Encounter (Signed)
Patient was in clinic today to have labs done. Patient advised he has been having episodes of left side shaking and stuttering. They are usually followed by slight headache and they have happened twice in the last year. He also advised that his BP has been high lately (182/103 average) and he expressed this to his kidney doctor and was told to just watch it. Advised he wants to let Dr Johnnye Sima know and asked if he thinks he needs to be seen sooner than 05/10/13. Advised the patient will ask the doctor and give him a call back.

## 2013-04-24 NOTE — Telephone Encounter (Signed)
Have pt f/u with pcp asap

## 2013-04-24 NOTE — Telephone Encounter (Signed)
I was notified by [facility] that Mr. Korver's Cr was at a critical level of 13.9 today.   He is an ESRD patient and is on hemodialysis.   Will send note to Dr. Johnnye Sima.

## 2013-04-25 DIAGNOSIS — N039 Chronic nephritic syndrome with unspecified morphologic changes: Secondary | ICD-10-CM | POA: Diagnosis not present

## 2013-04-25 DIAGNOSIS — N186 End stage renal disease: Secondary | ICD-10-CM | POA: Diagnosis not present

## 2013-04-25 DIAGNOSIS — D509 Iron deficiency anemia, unspecified: Secondary | ICD-10-CM | POA: Diagnosis not present

## 2013-04-25 DIAGNOSIS — D631 Anemia in chronic kidney disease: Secondary | ICD-10-CM | POA: Diagnosis not present

## 2013-04-25 LAB — HIV-1 RNA QUANT-NO REFLEX-BLD: HIV-1 RNA Quant, Log: <1.3 {Log} (ref <1.30)

## 2013-04-26 DIAGNOSIS — N039 Chronic nephritic syndrome with unspecified morphologic changes: Secondary | ICD-10-CM | POA: Diagnosis not present

## 2013-04-26 DIAGNOSIS — D509 Iron deficiency anemia, unspecified: Secondary | ICD-10-CM | POA: Diagnosis not present

## 2013-04-26 DIAGNOSIS — N186 End stage renal disease: Secondary | ICD-10-CM | POA: Diagnosis not present

## 2013-04-27 DIAGNOSIS — D631 Anemia in chronic kidney disease: Secondary | ICD-10-CM | POA: Diagnosis not present

## 2013-04-27 DIAGNOSIS — N186 End stage renal disease: Secondary | ICD-10-CM | POA: Diagnosis not present

## 2013-04-27 DIAGNOSIS — D509 Iron deficiency anemia, unspecified: Secondary | ICD-10-CM | POA: Diagnosis not present

## 2013-04-28 DIAGNOSIS — N039 Chronic nephritic syndrome with unspecified morphologic changes: Secondary | ICD-10-CM | POA: Diagnosis not present

## 2013-04-28 DIAGNOSIS — D509 Iron deficiency anemia, unspecified: Secondary | ICD-10-CM | POA: Diagnosis not present

## 2013-04-28 DIAGNOSIS — N186 End stage renal disease: Secondary | ICD-10-CM | POA: Diagnosis not present

## 2013-04-29 DIAGNOSIS — N186 End stage renal disease: Secondary | ICD-10-CM | POA: Diagnosis not present

## 2013-04-29 DIAGNOSIS — N039 Chronic nephritic syndrome with unspecified morphologic changes: Secondary | ICD-10-CM | POA: Diagnosis not present

## 2013-04-29 DIAGNOSIS — D509 Iron deficiency anemia, unspecified: Secondary | ICD-10-CM | POA: Diagnosis not present

## 2013-05-01 DIAGNOSIS — D509 Iron deficiency anemia, unspecified: Secondary | ICD-10-CM | POA: Diagnosis not present

## 2013-05-01 DIAGNOSIS — N186 End stage renal disease: Secondary | ICD-10-CM | POA: Diagnosis not present

## 2013-05-01 DIAGNOSIS — D631 Anemia in chronic kidney disease: Secondary | ICD-10-CM | POA: Diagnosis not present

## 2013-05-02 DIAGNOSIS — D631 Anemia in chronic kidney disease: Secondary | ICD-10-CM | POA: Diagnosis not present

## 2013-05-02 DIAGNOSIS — N186 End stage renal disease: Secondary | ICD-10-CM | POA: Diagnosis not present

## 2013-05-02 DIAGNOSIS — D509 Iron deficiency anemia, unspecified: Secondary | ICD-10-CM | POA: Diagnosis not present

## 2013-05-03 DIAGNOSIS — D509 Iron deficiency anemia, unspecified: Secondary | ICD-10-CM | POA: Diagnosis not present

## 2013-05-03 DIAGNOSIS — N186 End stage renal disease: Secondary | ICD-10-CM | POA: Diagnosis not present

## 2013-05-03 DIAGNOSIS — N039 Chronic nephritic syndrome with unspecified morphologic changes: Secondary | ICD-10-CM | POA: Diagnosis not present

## 2013-05-04 DIAGNOSIS — D509 Iron deficiency anemia, unspecified: Secondary | ICD-10-CM | POA: Diagnosis not present

## 2013-05-04 DIAGNOSIS — N186 End stage renal disease: Secondary | ICD-10-CM | POA: Diagnosis not present

## 2013-05-04 DIAGNOSIS — D631 Anemia in chronic kidney disease: Secondary | ICD-10-CM | POA: Diagnosis not present

## 2013-05-05 DIAGNOSIS — D631 Anemia in chronic kidney disease: Secondary | ICD-10-CM | POA: Diagnosis not present

## 2013-05-05 DIAGNOSIS — N186 End stage renal disease: Secondary | ICD-10-CM | POA: Diagnosis not present

## 2013-05-05 DIAGNOSIS — D509 Iron deficiency anemia, unspecified: Secondary | ICD-10-CM | POA: Diagnosis not present

## 2013-05-06 DIAGNOSIS — D509 Iron deficiency anemia, unspecified: Secondary | ICD-10-CM | POA: Diagnosis not present

## 2013-05-06 DIAGNOSIS — N039 Chronic nephritic syndrome with unspecified morphologic changes: Secondary | ICD-10-CM | POA: Diagnosis not present

## 2013-05-06 DIAGNOSIS — N186 End stage renal disease: Secondary | ICD-10-CM | POA: Diagnosis not present

## 2013-05-08 ENCOUNTER — Ambulatory Visit (INDEPENDENT_AMBULATORY_CARE_PROVIDER_SITE_OTHER): Payer: Medicare Other | Admitting: Infectious Diseases

## 2013-05-08 ENCOUNTER — Ambulatory Visit: Payer: Medicare Other | Admitting: Infectious Diseases

## 2013-05-08 ENCOUNTER — Encounter: Payer: Self-pay | Admitting: Infectious Diseases

## 2013-05-08 VITALS — BP 190/92 | HR 84 | Temp 98.5°F | Ht 72.0 in | Wt 200.0 lb

## 2013-05-08 DIAGNOSIS — R21 Rash and other nonspecific skin eruption: Secondary | ICD-10-CM | POA: Diagnosis not present

## 2013-05-08 DIAGNOSIS — D631 Anemia in chronic kidney disease: Secondary | ICD-10-CM | POA: Diagnosis not present

## 2013-05-08 DIAGNOSIS — B20 Human immunodeficiency virus [HIV] disease: Secondary | ICD-10-CM

## 2013-05-08 DIAGNOSIS — N186 End stage renal disease: Secondary | ICD-10-CM

## 2013-05-08 DIAGNOSIS — D509 Iron deficiency anemia, unspecified: Secondary | ICD-10-CM | POA: Diagnosis not present

## 2013-05-08 DIAGNOSIS — Z79899 Other long term (current) drug therapy: Secondary | ICD-10-CM

## 2013-05-08 DIAGNOSIS — Z113 Encounter for screening for infections with a predominantly sexual mode of transmission: Secondary | ICD-10-CM

## 2013-05-08 NOTE — Assessment & Plan Note (Signed)
He is doing well. Will continue his current meds. Offered/refused condoms. vax up to date. See him back in 6 months.

## 2013-05-08 NOTE — Progress Notes (Signed)
  Subjective:    Patient ID: Jeffrey [name], male    DOB: [birth year], 57 y.o.   MRN: [id number]  HPI 57 yo M with HIV+, ESRD, and December 2012 HD catheter infection. Currently on TFV/3TC/ISN. Has been doing HD at home. Admitted April 2014 with fluid overload.  Been feeling well. No problems with ART or HD. Has developed rash on LE over last 2 weeks. Pruritic. No new shampoos. Has gotten new laundry detergent and body soap. No bug bites. Has a toy poodle ( no fleas, gets dipped qow, is inside dog).  Worried about this BP- was normal yesterday. No sx from this. Has renal f/u on Wednesday.   HIV 1 RNA Quant (copies/mL)  Date Value  04/24/2013 <20   10/12/2012 <20   05/31/2012 <20      CD4 T Cell Abs (cmm)  Date Value  04/24/2013 210*  10/12/2012 310*  05/31/2012 290*    Review of Systems  Constitutional: Negative for appetite change and unexpected weight change.  Respiratory: Positive for wheezing. Negative for shortness of breath.   Cardiovascular: Negative for leg swelling.  Gastrointestinal: Negative for diarrhea and constipation.  Genitourinary: Negative for dysuria.  Skin: Positive for rash.       Objective:   Physical Exam  Constitutional: He appears well-developed and well-nourished.  HENT:  Mouth/Throat: No oropharyngeal exudate.  Eyes: EOM are normal. Pupils are equal, round, and reactive to light.  Neck: Neck supple.  Cardiovascular: Normal rate, regular rhythm and normal heart sounds.   Pulmonary/Chest: Effort normal and breath sounds normal.  Abdominal: Soft. Bowel sounds are normal. There is no tenderness. There is no rebound.  Musculoskeletal: He exhibits no edema.       Arms: Lymphadenopathy:    He has no cervical adenopathy.  Skin: Rash noted. Rash is papular.             Assessment & Plan:

## 2013-05-08 NOTE — Assessment & Plan Note (Signed)
Advised him to change his soap and detergent back to previous.

## 2013-05-08 NOTE — Assessment & Plan Note (Signed)
Doing well, has had to have his fistula revised. Will f/u with renal on 8-20 for BP (is asx). Continues to do hd at home.

## 2013-05-09 DIAGNOSIS — D631 Anemia in chronic kidney disease: Secondary | ICD-10-CM | POA: Diagnosis not present

## 2013-05-09 DIAGNOSIS — N186 End stage renal disease: Secondary | ICD-10-CM | POA: Diagnosis not present

## 2013-05-09 DIAGNOSIS — D509 Iron deficiency anemia, unspecified: Secondary | ICD-10-CM | POA: Diagnosis not present

## 2013-05-10 DIAGNOSIS — N186 End stage renal disease: Secondary | ICD-10-CM | POA: Diagnosis not present

## 2013-05-10 DIAGNOSIS — D631 Anemia in chronic kidney disease: Secondary | ICD-10-CM | POA: Diagnosis not present

## 2013-05-10 DIAGNOSIS — D509 Iron deficiency anemia, unspecified: Secondary | ICD-10-CM | POA: Diagnosis not present

## 2013-05-11 ENCOUNTER — Other Ambulatory Visit: Payer: Self-pay | Admitting: *Deleted

## 2013-05-11 DIAGNOSIS — F411 Generalized anxiety disorder: Secondary | ICD-10-CM

## 2013-05-11 DIAGNOSIS — D631 Anemia in chronic kidney disease: Secondary | ICD-10-CM | POA: Diagnosis not present

## 2013-05-11 DIAGNOSIS — N186 End stage renal disease: Secondary | ICD-10-CM | POA: Diagnosis not present

## 2013-05-11 DIAGNOSIS — D509 Iron deficiency anemia, unspecified: Secondary | ICD-10-CM | POA: Diagnosis not present

## 2013-05-11 MED ORDER — ALPRAZOLAM 0.5 MG PO TABS: 0.5000 mg | ORAL_TABLET | Freq: Two times a day (BID) | ORAL | Status: DC | PRN

## 2013-05-11 NOTE — Telephone Encounter (Signed)
Request for Xanax refill, called to [facility] on [address]. Myrtis Hopping

## 2013-05-12 DIAGNOSIS — N186 End stage renal disease: Secondary | ICD-10-CM | POA: Diagnosis not present

## 2013-05-12 DIAGNOSIS — D509 Iron deficiency anemia, unspecified: Secondary | ICD-10-CM | POA: Diagnosis not present

## 2013-05-12 DIAGNOSIS — D631 Anemia in chronic kidney disease: Secondary | ICD-10-CM | POA: Diagnosis not present

## 2013-05-13 DIAGNOSIS — N186 End stage renal disease: Secondary | ICD-10-CM | POA: Diagnosis not present

## 2013-05-13 DIAGNOSIS — D509 Iron deficiency anemia, unspecified: Secondary | ICD-10-CM | POA: Diagnosis not present

## 2013-05-13 DIAGNOSIS — D631 Anemia in chronic kidney disease: Secondary | ICD-10-CM | POA: Diagnosis not present

## 2013-05-15 DIAGNOSIS — N186 End stage renal disease: Secondary | ICD-10-CM | POA: Diagnosis not present

## 2013-05-15 DIAGNOSIS — D509 Iron deficiency anemia, unspecified: Secondary | ICD-10-CM | POA: Diagnosis not present

## 2013-05-15 DIAGNOSIS — D631 Anemia in chronic kidney disease: Secondary | ICD-10-CM | POA: Diagnosis not present

## 2013-05-16 DIAGNOSIS — D631 Anemia in chronic kidney disease: Secondary | ICD-10-CM | POA: Diagnosis not present

## 2013-05-16 DIAGNOSIS — N186 End stage renal disease: Secondary | ICD-10-CM | POA: Diagnosis not present

## 2013-05-16 DIAGNOSIS — D509 Iron deficiency anemia, unspecified: Secondary | ICD-10-CM | POA: Diagnosis not present

## 2013-05-17 DIAGNOSIS — D509 Iron deficiency anemia, unspecified: Secondary | ICD-10-CM | POA: Diagnosis not present

## 2013-05-17 DIAGNOSIS — N186 End stage renal disease: Secondary | ICD-10-CM | POA: Diagnosis not present

## 2013-05-17 DIAGNOSIS — D631 Anemia in chronic kidney disease: Secondary | ICD-10-CM | POA: Diagnosis not present

## 2013-05-18 DIAGNOSIS — N186 End stage renal disease: Secondary | ICD-10-CM | POA: Diagnosis not present

## 2013-05-18 DIAGNOSIS — D509 Iron deficiency anemia, unspecified: Secondary | ICD-10-CM | POA: Diagnosis not present

## 2013-05-18 DIAGNOSIS — D631 Anemia in chronic kidney disease: Secondary | ICD-10-CM | POA: Diagnosis not present

## 2013-05-19 DIAGNOSIS — D631 Anemia in chronic kidney disease: Secondary | ICD-10-CM | POA: Diagnosis not present

## 2013-05-19 DIAGNOSIS — D509 Iron deficiency anemia, unspecified: Secondary | ICD-10-CM | POA: Diagnosis not present

## 2013-05-19 DIAGNOSIS — N186 End stage renal disease: Secondary | ICD-10-CM | POA: Diagnosis not present

## 2013-05-20 DIAGNOSIS — N186 End stage renal disease: Secondary | ICD-10-CM | POA: Diagnosis not present

## 2013-05-20 DIAGNOSIS — D509 Iron deficiency anemia, unspecified: Secondary | ICD-10-CM | POA: Diagnosis not present

## 2013-05-20 DIAGNOSIS — D631 Anemia in chronic kidney disease: Secondary | ICD-10-CM | POA: Diagnosis not present

## 2013-05-21 DIAGNOSIS — N186 End stage renal disease: Secondary | ICD-10-CM | POA: Diagnosis not present

## 2013-05-22 ENCOUNTER — Other Ambulatory Visit: Payer: Self-pay | Admitting: Internal Medicine

## 2013-05-22 DIAGNOSIS — D509 Iron deficiency anemia, unspecified: Secondary | ICD-10-CM | POA: Diagnosis not present

## 2013-05-22 DIAGNOSIS — I120 Hypertensive chronic kidney disease with stage 5 chronic kidney disease or end stage renal disease: Secondary | ICD-10-CM | POA: Diagnosis not present

## 2013-05-22 DIAGNOSIS — R5381 Other malaise: Secondary | ICD-10-CM | POA: Diagnosis not present

## 2013-05-22 DIAGNOSIS — D631 Anemia in chronic kidney disease: Secondary | ICD-10-CM | POA: Diagnosis not present

## 2013-05-22 DIAGNOSIS — I953 Hypotension of hemodialysis: Secondary | ICD-10-CM | POA: Diagnosis not present

## 2013-05-22 DIAGNOSIS — G7000 Myasthenia gravis without (acute) exacerbation: Secondary | ICD-10-CM | POA: Diagnosis not present

## 2013-05-22 DIAGNOSIS — N186 End stage renal disease: Secondary | ICD-10-CM | POA: Diagnosis not present

## 2013-05-23 ENCOUNTER — Telehealth: Payer: Self-pay | Admitting: *Deleted

## 2013-05-23 NOTE — Telephone Encounter (Signed)
Pt requesting refill of his atarax, hasn't been refilled in 1 year.  Please advise. Landis Gandy, RN

## 2013-05-24 ENCOUNTER — Other Ambulatory Visit: Payer: Self-pay | Admitting: *Deleted

## 2013-05-24 DIAGNOSIS — R0981 Nasal congestion: Secondary | ICD-10-CM

## 2013-05-24 MED ORDER — HYDROXYZINE HCL 10 MG PO TABS: 5.0000 mg | ORAL_TABLET | Freq: Three times a day (TID) | ORAL | Status: AC | PRN

## 2013-05-24 NOTE — Telephone Encounter (Signed)
Ok to refill 

## 2013-06-12 ENCOUNTER — Inpatient Hospital Stay (HOSPITAL_COMMUNITY)
Admission: EM | Admit: 2013-06-12 | Discharge: 2013-06-14 | DRG: 682 | Disposition: A | Discharge status: Home / Self Care | Payer: Medicare Other | Attending: Internal Medicine | Admitting: Internal Medicine

## 2013-06-12 ENCOUNTER — Emergency Department (HOSPITAL_COMMUNITY): Payer: Medicare Other

## 2013-06-12 ENCOUNTER — Encounter (HOSPITAL_COMMUNITY): Payer: Self-pay | Admitting: Emergency Medicine

## 2013-06-12 DIAGNOSIS — J45909 Unspecified asthma, uncomplicated: Secondary | ICD-10-CM | POA: Diagnosis present

## 2013-06-12 DIAGNOSIS — Z79899 Other long term (current) drug therapy: Secondary | ICD-10-CM | POA: Diagnosis not present

## 2013-06-12 DIAGNOSIS — D649 Anemia, unspecified: Secondary | ICD-10-CM | POA: Diagnosis present

## 2013-06-12 DIAGNOSIS — M109 Gout, unspecified: Secondary | ICD-10-CM | POA: Diagnosis present

## 2013-06-12 DIAGNOSIS — B20 Human immunodeficiency virus [HIV] disease: Secondary | ICD-10-CM | POA: Diagnosis not present

## 2013-06-12 DIAGNOSIS — E875 Hyperkalemia: Secondary | ICD-10-CM

## 2013-06-12 DIAGNOSIS — I120 Hypertensive chronic kidney disease with stage 5 chronic kidney disease or end stage renal disease: Secondary | ICD-10-CM | POA: Diagnosis not present

## 2013-06-12 DIAGNOSIS — E8779 Other fluid overload: Secondary | ICD-10-CM | POA: Diagnosis not present

## 2013-06-12 DIAGNOSIS — Z992 Dependence on renal dialysis: Secondary | ICD-10-CM | POA: Diagnosis not present

## 2013-06-12 DIAGNOSIS — I160 Hypertensive urgency: Secondary | ICD-10-CM | POA: Diagnosis present

## 2013-06-12 DIAGNOSIS — N186 End stage renal disease: Secondary | ICD-10-CM | POA: Diagnosis not present

## 2013-06-12 DIAGNOSIS — J811 Chronic pulmonary edema: Secondary | ICD-10-CM | POA: Diagnosis not present

## 2013-06-12 DIAGNOSIS — F17200 Nicotine dependence, unspecified, uncomplicated: Secondary | ICD-10-CM | POA: Diagnosis present

## 2013-06-12 DIAGNOSIS — Z21 Asymptomatic human immunodeficiency virus [HIV] infection status: Secondary | ICD-10-CM | POA: Diagnosis present

## 2013-06-12 DIAGNOSIS — D696 Thrombocytopenia, unspecified: Secondary | ICD-10-CM | POA: Diagnosis not present

## 2013-06-12 DIAGNOSIS — B1910 Unspecified viral hepatitis B without hepatic coma: Secondary | ICD-10-CM | POA: Diagnosis present

## 2013-06-12 DIAGNOSIS — I10 Essential (primary) hypertension: Secondary | ICD-10-CM

## 2013-06-12 DIAGNOSIS — F411 Generalized anxiety disorder: Secondary | ICD-10-CM | POA: Diagnosis present

## 2013-06-12 DIAGNOSIS — E876 Hypokalemia: Secondary | ICD-10-CM | POA: Diagnosis present

## 2013-06-12 DIAGNOSIS — M899 Disorder of bone, unspecified: Secondary | ICD-10-CM | POA: Diagnosis present

## 2013-06-12 DIAGNOSIS — E871 Hypo-osmolality and hyponatremia: Secondary | ICD-10-CM

## 2013-06-12 DIAGNOSIS — R0602 Shortness of breath: Secondary | ICD-10-CM

## 2013-06-12 DIAGNOSIS — R0989 Other specified symptoms and signs involving the circulatory and respiratory systems: Secondary | ICD-10-CM | POA: Diagnosis not present

## 2013-06-12 DIAGNOSIS — I129 Hypertensive chronic kidney disease with stage 1 through stage 4 chronic kidney disease, or unspecified chronic kidney disease: Secondary | ICD-10-CM

## 2013-06-12 DIAGNOSIS — Z23 Encounter for immunization: Secondary | ICD-10-CM

## 2013-06-12 DIAGNOSIS — E8770 Fluid overload, unspecified: Secondary | ICD-10-CM

## 2013-06-12 DIAGNOSIS — J40 Bronchitis, not specified as acute or chronic: Secondary | ICD-10-CM | POA: Diagnosis not present

## 2013-06-12 DIAGNOSIS — Z720 Tobacco use: Secondary | ICD-10-CM | POA: Diagnosis present

## 2013-06-12 HISTORY — DX: Unspecified asthma, uncomplicated: J45.909

## 2013-06-12 LAB — POCT I-STAT, CHEM 8
BUN: 42 mg/dL — ABNORMAL HIGH (ref 6–23)
Calcium, Ion: 1.1 mmol/L — ABNORMAL LOW (ref 1.12–1.23)
Chloride: 93 mEq/L — ABNORMAL LOW (ref 96–112)
Glucose, Bld: 91 mg/dL (ref 70–99)
HCT: 29 % — ABNORMAL LOW (ref 39.0–52.0)
Potassium: 3.1 mEq/L — ABNORMAL LOW (ref 3.5–5.1)
Sodium: 137 mEq/L (ref 135–145)

## 2013-06-12 LAB — HEPATIC FUNCTION PANEL
ALT: 10 U/L (ref 0–53)
AST: 15 U/L (ref 0–37)
Albumin: 3.8 g/dL (ref 3.5–5.2)
Bilirubin, Direct: 0.2 mg/dL (ref 0.0–0.3)
Indirect Bilirubin: 0.5 mg/dL (ref 0.3–0.9)
Total Bilirubin: 0.7 mg/dL (ref 0.3–1.2)

## 2013-06-12 LAB — RENAL FUNCTION PANEL
Albumin: 3.7 g/dL (ref 3.5–5.2)
BUN: 45 mg/dL — ABNORMAL HIGH (ref 6–23)
CO2: 30 mEq/L (ref 19–32)
Calcium: 9.6 mg/dL (ref 8.4–10.5)
Creatinine, Ser: 10.67 mg/dL — ABNORMAL HIGH (ref 0.50–1.35)
Glucose, Bld: 119 mg/dL — ABNORMAL HIGH (ref 70–99)

## 2013-06-12 LAB — CBC
HCT: 27 % — ABNORMAL LOW (ref 39.0–52.0)
Hemoglobin: 9.3 g/dL — ABNORMAL LOW (ref 13.0–17.0)
MCV: 87.1 fL (ref 78.0–100.0)
Platelets: 98 10*3/uL — ABNORMAL LOW (ref 150–400)
RBC: 3.1 MIL/uL — ABNORMAL LOW (ref 4.22–5.81)
RDW: 16.1 % — ABNORMAL HIGH (ref 11.5–15.5)
WBC: 4 10*3/uL (ref 4.0–10.5)

## 2013-06-12 MED ORDER — DARBEPOETIN ALFA-POLYSORBATE 25 MCG/0.42ML IJ SOLN
INTRAMUSCULAR | Status: AC
  Filled 2013-06-12: qty 0.42

## 2013-06-12 MED ORDER — ALBUTEROL SULFATE HFA 108 (90 BASE) MCG/ACT IN AERS: 2.0000 | INHALATION_SPRAY | RESPIRATORY_TRACT | Status: DC | PRN

## 2013-06-12 MED ORDER — LABETALOL HCL 200 MG PO TABS
200.0000 mg | ORAL_TABLET | Freq: Once | ORAL | Status: AC
  Administered 2013-06-12: 200 mg via ORAL
  Filled 2013-06-12: qty 1

## 2013-06-12 MED ORDER — SODIUM CHLORIDE 0.9 % IJ SOLN
3.0000 mL | Freq: Two times a day (BID) | INTRAMUSCULAR | Status: DC
  Administered 2013-06-12 (×2): 3 mL via INTRAVENOUS

## 2013-06-12 MED ORDER — SODIUM CHLORIDE 0.9 % IJ SOLN: 3.0000 mL | INTRAMUSCULAR | Status: DC | PRN

## 2013-06-12 MED ORDER — HYDRALAZINE HCL 25 MG PO TABS
25.0000 mg | ORAL_TABLET | Freq: Once | ORAL | Status: AC
  Administered 2013-06-12: 25 mg via ORAL
  Filled 2013-06-12: qty 1

## 2013-06-12 MED ORDER — ALBUTEROL SULFATE (5 MG/ML) 0.5% IN NEBU
5.0000 mg | INHALATION_SOLUTION | Freq: Once | RESPIRATORY_TRACT | Status: AC
  Administered 2013-06-12: 5 mg via RESPIRATORY_TRACT
  Filled 2013-06-12: qty 1

## 2013-06-12 MED ORDER — LABETALOL HCL 200 MG PO TABS
200.0000 mg | ORAL_TABLET | Freq: Two times a day (BID) | ORAL | Status: DC
  Administered 2013-06-12 – 2013-06-14 (×3): 200 mg via ORAL
  Filled 2013-06-12 (×6): qty 1

## 2013-06-12 MED ORDER — TENOFOVIR DISOPROXIL FUMARATE 300 MG PO TABS: 300.0000 mg | ORAL_TABLET | ORAL | Status: DC

## 2013-06-12 MED ORDER — PENTAFLUOROPROP-TETRAFLUOROETH EX AERO
INHALATION_SPRAY | CUTANEOUS | Status: AC
  Filled 2013-06-12: qty 103.5

## 2013-06-12 MED ORDER — TRAZODONE HCL 50 MG PO TABS
50.0000 mg | ORAL_TABLET | Freq: Every day | ORAL | Status: DC
  Administered 2013-06-12 – 2013-06-13 (×2): 50 mg via ORAL
  Filled 2013-06-12 (×3): qty 1

## 2013-06-12 MED ORDER — SODIUM CHLORIDE 0.9 % IJ SOLN: 3.0000 mL | Freq: Two times a day (BID) | INTRAMUSCULAR | Status: DC

## 2013-06-12 MED ORDER — RENA-VITE PO TABS
1.0000 | ORAL_TABLET | Freq: Every day | ORAL | Status: DC
  Administered 2013-06-12 – 2013-06-13 (×2): 1 via ORAL
  Filled 2013-06-12 (×3): qty 1

## 2013-06-12 MED ORDER — CALCITRIOL 0.25 MCG PO CAPS
0.2500 ug | ORAL_CAPSULE | Freq: Two times a day (BID) | ORAL | Status: DC
  Administered 2013-06-12 – 2013-06-14 (×5): 0.25 ug via ORAL
  Filled 2013-06-12 (×6): qty 1

## 2013-06-12 MED ORDER — SODIUM CHLORIDE 0.9 % IV SOLN
250.0000 mg | INTRAVENOUS | Status: AC
  Administered 2013-06-12: 250 mg via INTRAVENOUS
  Filled 2013-06-12 (×2): qty 20

## 2013-06-12 MED ORDER — RALTEGRAVIR POTASSIUM 400 MG PO TABS
400.0000 mg | ORAL_TABLET | Freq: Two times a day (BID) | ORAL | Status: DC
  Administered 2013-06-12 – 2013-06-14 (×5): 400 mg via ORAL
  Filled 2013-06-12 (×6): qty 1

## 2013-06-12 MED ORDER — HYDRALAZINE HCL 20 MG/ML IJ SOLN
10.0000 mg | Freq: Once | INTRAMUSCULAR | Status: AC
  Administered 2013-06-12: 10 mg via INTRAVENOUS
  Filled 2013-06-12: qty 1

## 2013-06-12 MED ORDER — CALCIUM ACETATE 667 MG PO CAPS
1334.0000 mg | ORAL_CAPSULE | Freq: Three times a day (TID) | ORAL | Status: DC | PRN
  Filled 2013-06-12: qty 2

## 2013-06-12 MED ORDER — HYDRALAZINE HCL 25 MG PO TABS
25.0000 mg | ORAL_TABLET | Freq: Three times a day (TID) | ORAL | Status: DC
  Administered 2013-06-12 – 2013-06-14 (×4): 25 mg via ORAL
  Filled 2013-06-12 (×10): qty 1

## 2013-06-12 MED ORDER — HEPARIN SODIUM (PORCINE) 5000 UNIT/ML IJ SOLN
5000.0000 [IU] | Freq: Three times a day (TID) | INTRAMUSCULAR | Status: DC
  Administered 2013-06-12 – 2013-06-14 (×5): 5000 [IU] via SUBCUTANEOUS
  Filled 2013-06-12 (×9): qty 1

## 2013-06-12 MED ORDER — CALCIUM ACETATE 667 MG PO TABS: 1334.0000 mg | ORAL_TABLET | Freq: Three times a day (TID) | ORAL | Status: DC

## 2013-06-12 MED ORDER — AMLODIPINE BESYLATE 10 MG PO TABS
10.0000 mg | ORAL_TABLET | Freq: Once | ORAL | Status: AC
  Administered 2013-06-12: 10 mg via ORAL
  Filled 2013-06-12: qty 1

## 2013-06-12 MED ORDER — SODIUM CHLORIDE 0.9 % IV SOLN: 250.0000 mL | INTRAVENOUS | Status: DC | PRN

## 2013-06-12 MED ORDER — CALCIUM ACETATE 667 MG PO CAPS
2001.0000 mg | ORAL_CAPSULE | Freq: Three times a day (TID) | ORAL | Status: DC
  Administered 2013-06-12: 2001 mg via ORAL
  Administered 2013-06-12: 1334 mg via ORAL
  Administered 2013-06-13 – 2013-06-14 (×4): 2001 mg via ORAL
  Filled 2013-06-12 (×10): qty 3

## 2013-06-12 MED ORDER — LAMIVUDINE 5 MG/ML PO SOLN: 25.0000 mg | Freq: Every day | ORAL | Status: DC

## 2013-06-12 MED ORDER — DOXYCYCLINE HYCLATE 100 MG PO TABS
100.0000 mg | ORAL_TABLET | Freq: Two times a day (BID) | ORAL | Status: DC
Start: 2013-06-12 — End: 2013-06-12
  Administered 2013-06-12: 100 mg via ORAL
  Filled 2013-06-12 (×2): qty 1

## 2013-06-12 MED ORDER — DARBEPOETIN ALFA-POLYSORBATE 25 MCG/0.42ML IJ SOLN
25.0000 ug | INTRAMUSCULAR | Status: DC
  Administered 2013-06-12: 25 ug via INTRAVENOUS

## 2013-06-12 MED ORDER — INFLUENZA VAC SPLIT QUAD 0.5 ML IM SUSP
0.5000 mL | INTRAMUSCULAR | Status: AC
  Administered 2013-06-13: 0.5 mL via INTRAMUSCULAR
  Filled 2013-06-12: qty 0.5

## 2013-06-12 MED ORDER — CINACALCET HCL 30 MG PO TABS
30.0000 mg | ORAL_TABLET | Freq: Every day | ORAL | Status: DC
  Administered 2013-06-12 – 2013-06-14 (×3): 30 mg via ORAL
  Filled 2013-06-12 (×4): qty 1

## 2013-06-12 MED ORDER — LAMIVUDINE 10 MG/ML PO SOLN
25.0000 mg | Freq: Every day | ORAL | Status: DC
  Administered 2013-06-12 – 2013-06-14 (×3): 25 mg via ORAL
  Filled 2013-06-12 (×3): qty 5

## 2013-06-12 MED ORDER — AMLODIPINE BESYLATE 10 MG PO TABS
10.0000 mg | ORAL_TABLET | Freq: Two times a day (BID) | ORAL | Status: DC
  Administered 2013-06-12 – 2013-06-14 (×3): 10 mg via ORAL
  Filled 2013-06-12 (×6): qty 1

## 2013-06-12 MED ORDER — DOXYCYCLINE HYCLATE 100 MG PO TABS: 100.0000 mg | ORAL_TABLET | Freq: Two times a day (BID) | ORAL | Status: DC

## 2013-06-12 MED ORDER — ALPRAZOLAM 0.5 MG PO TABS: 0.5000 mg | ORAL_TABLET | Freq: Two times a day (BID) | ORAL | Status: DC | PRN

## 2013-06-12 MED ORDER — NITROGLYCERIN IN D5W 200-5 MCG/ML-% IV SOLN
5.0000 ug/min | INTRAVENOUS | Status: DC
  Administered 2013-06-12: 5 ug/min via INTRAVENOUS
  Filled 2013-06-12: qty 250

## 2013-06-12 MED ORDER — ALBUTEROL SULFATE (5 MG/ML) 0.5% IN NEBU
2.5000 mg | INHALATION_SOLUTION | RESPIRATORY_TRACT | Status: DC | PRN
Start: 2013-06-12 — End: 2013-06-13
  Administered 2013-06-13: 2.5 mg via RESPIRATORY_TRACT
  Filled 2013-06-12: qty 0.5

## 2013-06-12 NOTE — Progress Notes (Signed)
Pt admitted to 2C16 from ED; tele attached; CHG completed; Dr's notified of arrival; will continue to monitor.

## 2013-06-12 NOTE — Progress Notes (Signed)
Subjective: Overall, patient feels improved today. His SOB is much better and believes the nebulizer treatments helped. Patient does report hx of asthma, no hx of COPD however. He uses his albuterol inhaler approx 1-2 times per month at baseline, with increased use recently as he has developed a dry cough over the past week that primarily bothers him at night. He reports doing home HD 4-5 times per week, last dialyzed 9/21. His blood pressures have been "running high at home" for the past three months and recently his nephrologist changed his home medications from coreg to labetalol with continuation of his amlodipine and hydralazine. He is compliant with his medications.  Objective: Vital signs in last 24 hours: Filed Vitals:   06/12/13 0826 06/12/13 0836 06/12/13 0846 06/12/13 0900  BP: 120/86 117/97 167/106 169/86  Pulse: 78     Temp: 98.4 F (36.9 C)     TempSrc: Oral     Resp: 21     Height: 6' (1.829 m)     Weight: 85.6 kg (188 lb 11.4 oz)     SpO2: 94%      Weight change:  No intake or output data in the 24 hours ending 06/12/13 1426  Physical Exam General: lying flat in bed, he is alert, cooperative, and in no apparent distress HEENT: NCAT, vision grossly intact Neck: supple Lungs: crackles and expiratory wheezes at bilateral bases, normal WOB Heart: regular rate and rhythm, no murmurs, gallops, or rubs Abdomen: soft, non-tender, non-distended, normal bowel sounds  Extremities: trace BLE edema, warm extremities with good perfusion Neurologic: alert & oriented X3, cranial nerves II-XII grossly intact, strength and sensation is grossly intact  Lab Results: Basic Metabolic Panel:  Recent Labs Lab 06/12/13 0311 06/12/13 1025  NA 137 132*  K 3.1* 3.0*  CL 93* 90*  CO2  --  30  GLUCOSE 91 119*  BUN 42* 45*  CREATININE 9.80* 10.67*  CALCIUM  --  9.6  PHOS  --  3.0   Liver Function Tests:  Recent Labs Lab 06/12/13 1025  AST 15  ALT 10  ALKPHOS 62  BILITOT  0.7  PROT 8.3  ALBUMIN 3.7  3.8   CBC:  Recent Labs Lab 06/12/13 0304 06/12/13 0311  WBC 4.0  --   HGB 9.3* 9.9*  HCT 27.0* 29.0*  MCV 87.1  --   PLT 98*  --    Cardiac Enzymes:  Recent Labs Lab 06/12/13 1025  TROPONINI <0.30   Urine Drug Screen: Drugs of Abuse     Component Value Date/Time   LABOPIA NONE DETECTED 06/02/2011 0948   COCAINSCRNUR POSITIVE* 06/02/2011 0948   LABBENZ NONE DETECTED 06/02/2011 0948   AMPHETMU NONE DETECTED 06/02/2011 0948   THCU NONE DETECTED 06/02/2011 0948   LABBARB NONE DETECTED 06/02/2011 0948     Micro Results: Recent Results (from the past 240 hour(s))  MRSA PCR SCREENING     Status: None   Collection Time    06/12/13  9:29 AM      Result Value Range Status   MRSA by PCR NEGATIVE  NEGATIVE Final   Comment:            The GeneXpert MRSA Assay (FDA     approved for NASAL specimens     only), is one component of a     comprehensive MRSA colonization     surveillance program. It is not     intended to diagnose MRSA     infection nor to guide  or     monitor treatment for     MRSA infections.   Studies/Results: Dg Chest 2 View  06/12/2013   CLINICAL DATA:  Shortness of breath in cough for 2 days  EXAM: CHEST  2 VIEW  COMPARISON:  Chest x-ray from earlier the same day  FINDINGS: Cardiomegaly stable from before. Symmetric bilateral interstitial opacities. There is linear scarring or accessory fissure at the right base, stable from prior. No effusion or pneumothorax.  Left glenohumeral osteoarthritis with sclerotic humeral head.  IMPRESSION: Bronchitic changes which could represent developing CHF or atypical infection.   Electronically Signed   By: Jorje Guild   On: 06/12/2013 06:17   Dg Chest Portable 1 View  06/12/2013   CLINICAL DATA:  Hypertension.  EXAM: PORTABLE CHEST - 1 VIEW  COMPARISON:  PA and lateral chest 01/09/2013.  FINDINGS: There is cardiomegaly and vascular congestion without frank edema. No consolidative process,  pneumothorax or effusion is identified.  IMPRESSION: Cardiomegaly and pulmonary vascular congestion. No acute process.   Electronically Signed   By: Inge Rise M.D.   On: 06/12/2013 03:11   Medications: I have reviewed the patient's current medications. Scheduled Meds: . amLODipine  10 mg Oral BID  . calcitRIOL  0.25 mcg Oral BID  . calcium acetate  2,001 mg Oral TID WC  . cinacalcet  30 mg Oral Q breakfast  . darbepoetin  25 mcg Intravenous Q Mon-HD  . ferric gluconate (FERRLECIT/NULECIT) IV  250 mg Intravenous Q M,W,F-HD  . heparin  5,000 Units Subcutaneous Q8H  . hydrALAZINE  25 mg Oral TID  . labetalol  200 mg Oral BID  . lamiVUDine  25 mg Oral Daily  . multivitamin  1 tablet Oral QHS  . raltegravir  400 mg Oral BID  . sodium chloride  3 mL Intravenous Q12H  . sodium chloride  3 mL Intravenous Q12H  . [START ON 06/17/2013] tenofovir  300 mg Oral Q Sat  . traZODone  50 mg Oral QHS   Continuous Infusions:  PRN Meds:.sodium chloride, albuterol, albuterol, ALPRAZolam, calcium acetate, sodium chloride  Assessment/Plan: #Hypertensive urgency- BP 218/113 on presentation, started on nitroglycerine gtt in ED, which patient's blood pressure responded to and went down to 176/84 at which point NTG drip was discontinued and patient's home medications were restarted. BPs have been 117-169/86-106 today. -telemetry  -continue home medications: labetelol 200 mg BID, amlodipine 10 mg BID, hydralazine 25 mg TID -troponin x 1 negative -EKG without ischemic changes -HD today- will likely decrease BP -renal to make BP recommendations   #Fluid overload due to ESRD (on home HD)-  Follows with Dr. Justin Mend (though pt requesting new nephrologist at this time). On home HD every other day via buttonhole fistula in left lower arm (though he is supposed to do 5 days/week). Likely not taking enough fluid off as he weighs himself and thinks his scale and BP cuff at home are broken. Cr 9.8 on admission, unclear  baseline. On exam, crackles and wheezes bilaterally with trace lower extremity edema. Echo in 4/14 showed EF 60-65%, no wall motion abnomalities, bilateral atrial enlargement. Portable CXR in ED with stable cardiomegaly, pulmonary venous congestion w/ repeat PA and lateral CXR showing bronchitic changes which could represent developing CHF vs atypical infection. Pt got Duoneb in the ED and felt better after, has reported history of asthma. Of note, pt's HD machine has been broken in past though he thinks it has been working (recently submitted diasylate and urine samples).  -  home albuterol and nebulizer q4h prn  -daily weights (today 88.5 kg which is his reported dry weight) -Is and Os  -renal on board, HD today  -renal diet  -consider consult to [facility] for help with dialysis   #Shortness of breath and dry cough- Likely multifactorial with both volume overload and asthma contributing. SOB x 1 week with associated dry cough. CXR showed possibility of infection, though pt is afebrile, without other signs of infection therefore the antibiotics that were started overnight were discontinued today. Pt states he lights a cigarette from time to time but doesn't smoke.  -monitor pulse ox  -consider outpatient PFTs as pt states that he has asthma   #Hypokalemia- K 3.1 on admission, did not supplement given HD today.   # HIV, controlled and HBV- Pt follows with Dr. Johnnye Sima. Most recent CD4 210, viral load <20 on 04/24/13. Reports good medication compliance with ART.  -continue home meds raltegravir 400 mg BID, lamivudine 25 mg daily, tenofavir 300 mg qSat   # Normocytic anemia and thrombocytopenia, chronic- Stable. Hgb 9.9 (baseline 10-12), platelets 98 (86 at last admission), likely related to chronic disease. Pt takes Epogen at home.   #Tobacco abuse and hx of cocaine use- 1 pack lasts months  -smoking cessation counseling  -UDS pending  #DVT PPX- heparin (given ESRD)  #Code status- Full code   Dispo:  Disposition is deferred at this time, awaiting improvement of current medical problems.  Anticipated discharge in approximately 1-2 day(s).   The patient does have a current PCP Campbell Riches, MD) and does not need an [facility] hospital follow-up appointment after discharge.  The patient does not have transportation limitations that hinder transportation to clinic appointments.  .Services Needed at time of discharge: Y = Yes, Blank = No PT:   OT:   RN:   Equipment:   Other:     LOS: 0 days   Rebecca Eaton, MD 06/12/2013, 2:26 PM

## 2013-06-12 NOTE — Procedures (Signed)
I was present at this dialysis session. I have reviewed the session itself and made appropriate changes.   Kelly Splinter  MD Pager [phone]    Cell  [phone] 06/12/2013, 5:26 PM

## 2013-06-12 NOTE — ED Notes (Signed)
Pt reports high BP (237/112) at home tonight after hemodialysis.  Also c/o non-productive cough x 1 week.

## 2013-06-12 NOTE — ED Provider Notes (Signed)
CSN: [id number]     Arrival date & time 06/12/13  0209 History   First MD Initiated Contact with Patient 06/12/13 0235     Chief Complaint  Patient presents with  . Hypertension   (Consider location/radiation/quality/duration/timing/severity/associated sxs/prior Treatment) HPI History provided by the patient. Home hemodialysis patient with history of HIV/ hypertension. Tonight at home after routine dialysis began feeling short of breath and noticed blood pressure to be severely elevated. He states his baseline blood pressure is around 160. He was recently started on labetalol and has increased it from once to twice daily. He became very concerned tonight when his blood pressure was 220/120 despite his medications. He is also complaining of wheezing and difficulty breathing. No chest pain. No cough or fevers. He dialyzed himself to 88 kg, his dry weight is 88.5 kg Symptoms moderate in severity  Past Medical History  Diagnosis Date  . Hypertension   . Gout   . Anemia   . Seizures 06/02/2011  . Thrombocytopenia   . Anxiety   . Hepatitis B carrier   . HIV infection   . Seizure   . End stage renal disease on home HD 07/10/2011    Started HD in September 2012 at [facility] with a tunneled HD catheter. Now dialyzing through AVF L lower arm with buttonhole technique. Is doing home hemodialysis, his brother took the training course and is doing the HD treatments at home on Mr [place].  They are roommates for 23 years.  His brother works 3rd shift and gets off at about [place] and then puts Mr Barnett on HD in the morning after getting home. He does dialyze on a typical schedule, but most of the time he does HD about 4 times a week, for about 4 hours per treatment. Cause of ESRD was HTN according to patient. He says he let his health go and ending up with complications, that he didn't like seeing doctors in those days.  He says he was diagnosed with HTN when he lived in [place] in his 45's, he had a severe headache  and BP was 220/130 in the ED.  BP responded well to medication.     Past Surgical History  Procedure Laterality Date  . Av fistula placement  06/02/11    Left radiocephalic AVF  . Other surgical history      removal temporary HD catheter    Family History  Problem Relation Age of Onset  . Hypertension Mother   . Cancer Father    History  Substance Use Topics  . Smoking status: Current Some Day Smoker    Types: Cigarettes  . Smokeless tobacco: Never Used     Comment: not ready to quit yet  . Alcohol Use: No    Review of Systems  Constitutional: Negative for fever and chills.  HENT: Negative for neck pain and neck stiffness.   Eyes: Negative for visual disturbance.  Respiratory: Positive for shortness of breath and wheezing.   Cardiovascular: Negative for chest pain.  Gastrointestinal: Negative for vomiting and abdominal pain.  Genitourinary: Negative for flank pain.  Musculoskeletal: Negative for back pain.  Skin: Negative for rash.  Neurological: Negative for headaches.  All other systems reviewed and are negative.    Allergies  Lisinopril and Penicillins  Home Medications   Current Outpatient Rx  Name  Route  Sig  Dispense  Refill  . albuterol (PROVENTIL HFA;VENTOLIN HFA) 108 (90 BASE) MCG/ACT inhaler   Inhalation   Inhale 2 puffs into  the lungs every 4 (four) hours as needed. For shortness of breath         . ALPRAZolam (XANAX) 0.5 MG tablet   Oral   Take 1 tablet (0.5 mg total) by mouth 2 (two) times daily as needed for sleep.   60 tablet   2   . amLODipine (NORVASC) 10 MG tablet   Oral   Take 10 mg by mouth 2 (two) times daily.         . calcitRIOL (ROCALTROL) 0.25 MCG capsule   Oral   Take 0.25 mcg by mouth 2 (two) times daily.         . Calcium Acetate 667 MG TABS   Oral   Take 1,334-2,001 mg by mouth 3 (three) times daily with meals. Takes 3 capsules with meals and 2 capsules with snacks         . cinacalcet (SENSIPAR) 30 MG tablet    Oral   Take 30 mg by mouth daily.         . hydrALAZINE (APRESOLINE) 25 MG tablet   Oral   Take 1 tablet (25 mg total) by mouth 3 (three) times daily.   90 tablet   0   . labetalol (NORMODYNE) 200 MG tablet   Oral   Take 400 mg by mouth 2 (two) times daily.         Marland Kitchen lamivudine (EPIVIR-HBV) 5 MG/ML solution   Oral   Take 5 mLs (25 mg total) by mouth daily.   240 mL   3   . multivitamin (RENA-VIT) TABS tablet   Oral   Take 1 tablet by mouth daily.           . raltegravir (ISENTRESS) 400 MG tablet   Oral   Take 1 tablet (400 mg total) by mouth 2 (two) times daily.   60 tablet   11   . tenofovir (VIREAD) 300 MG tablet   Oral   Take 300 mg by mouth every 7 (seven) days. On Saturdays         . traZODone (DESYREL) 50 MG tablet   Oral   Take 1 tablet (50 mg total) by mouth at bedtime.   30 tablet   1    BP 180/87  Pulse 79  Temp(Src) 98.1 F (36.7 C) (Oral)  Resp 22  SpO2 94% Physical Exam  Constitutional: He is oriented to person, place, and time. He appears well-developed and well-nourished.  HENT:  Head: Normocephalic and atraumatic.  Eyes: EOM are normal. Pupils are equal, round, and reactive to light.  Neck: Neck supple.  Cardiovascular: Regular rhythm and intact distal pulses.   Borderline tachycardic heart rate 90s to 105  Pulmonary/Chest:  Mild tachypnea with expiratory wheezes/ insp rales  Abdominal: Soft. He exhibits no distension. There is no tenderness.  Musculoskeletal: Normal range of motion. He exhibits no edema.  Neurological: He is alert and oriented to person, place, and time.  Skin: Skin is warm and dry.    ED Course  Procedures (including critical care time) Labs Review Labs Reviewed  CBC - Abnormal; Notable for the following:    RBC 3.10 (*)    Hemoglobin 9.3 (*)    HCT 27.0 (*)    RDW 16.1 (*)    Platelets 98 (*)    All other components within normal limits  POCT I-STAT, CHEM 8 - Abnormal; Notable for the following:     Potassium 3.1 (*)    Chloride 93 (*)  BUN 42 (*)    Creatinine, Ser 9.80 (*)    Calcium, Ion 1.10 (*)    Hemoglobin 9.9 (*)    HCT 29.0 (*)    All other components within normal limits   Imaging Review Dg Chest Portable 1 View  06/12/2013   CLINICAL DATA:  Hypertension.  EXAM: PORTABLE CHEST - 1 VIEW  COMPARISON:  PA and lateral chest 01/09/2013.  FINDINGS: There is cardiomegaly and vascular congestion without frank edema. No consolidative process, pneumothorax or effusion is identified.  IMPRESSION: Cardiomegaly and pulmonary vascular congestion. No acute process.   Electronically Signed   By: Inge Rise M.D.   On: 06/12/2013 03:11    Date: 06/12/2013  Rate: 90  Rhythm: normal sinus rhythm  QRS Axis: normal  Intervals: normal  ST/T Wave abnormalities: nonspecific ST changes  Conduction Disutrbances:none  Narrative Interpretation:   Old EKG Reviewed: unchanged  Albuterol neb, IV NTG drip  D/w [facility] resident Dr Marigene Ehlers will evaluate for admit  MDM  Diagnosis: Vascular congestion, hypertension  EKG, chest x-ray, labs Blood pressure control Medicine consult   Teressa Lower, MD 06/12/13 [phone]

## 2013-06-12 NOTE — ED Notes (Signed)
Shown to dr.opitz old&new ekg.done

## 2013-06-12 NOTE — H&P (Signed)
  Date: 06/12/2013  Patient name: Jeffrey [name]  Medical record number: [id number]  Date of birth: 09/04/1956   I have seen and evaluated [name] and discussed their care with the Residency Team. Mr Yehl was admitted with HTN urgency and volume overload.   Assessment and Plan: I have seen and evaluated the patient as outlined above. I agree with the formulated Assessment and Plan as detailed in the residents' admission note, with the following changes:   1. Hypertensive urgency - BP has decreased appropriately after getting NTG gtt in the ED. He has since bee transitioned tohome meds. He will also get HD which will help with BP control.  2. Volume overload - renal will take him to HD today.   3. Acute Resp Distress - The most likely cause is vol overload. He also has asthma and has gotten improvement with nebs so will cont those. He was started on ABX on admit 2/2 CXR findings. Now that it is clearer that the CXR findings are vol overload and not infx, plus no fever and no increased WBC, we will stop the ABX and follow clinically.   Bartholomew Crews, MD 9/22/20143:11 PM

## 2013-06-12 NOTE — Progress Notes (Signed)
Received referral for [facility] Care Management services. Came to room as patient was leaving for HD. Will come back at later time.  [name], MSN- Ed, RN, [facility] [phone]

## 2013-06-12 NOTE — H&P (Signed)
Date: 06/12/2013               Patient Name:  Jeffrey [name] MRN: [id number]  DOB: 06-Dec-1955 Age / Sex: 57 y.o., male   PCP: Campbell Riches, MD         Medical Service: Internal Medicine Teaching Service         Attending Physician: Dr. Lynnae January    First Contact: Dr. Mechele Claude Pager: [phone]  Second Contact: Dr. Eulas Post Pager: [phone]       After Hours (After 5p/  First Contact Pager: [phone]  weekends / holidays): Second Contact Pager: [phone]   Chief Complaint: shortness of breath x 1 week  History of Present Illness:  Jeffrey [name] is a 57 year old man with history of ESRD (on home HD every other day via LUE buttonhole fistula), HIV (last CD 210, viral load <20 on 04/25/13, follows with Dr. Johnnye Sima), HBV, HTN, anemia/thrombocytopenia who presents with shortness of breath and nonproductive cough x 1 week, no lower extremity edema.  Patient states that he is supposed to perform HD 5 days/week although he only does every other day because it "wears him out."  He weighs himself before each session and takes off fluid accordingly.  His reported dry weight is 88.5 kg; last HD session on 9/21, he weighed 87.9kg so took off 1 L.  However, he thinks his scale and BP cuff may be broken.  He does continue to make some urine, about half a specimen cup full, 2-3x daily.  Patient is concerned because to for the last 3 months, he has noticed his BP has remained high even after dialysis.  He recently mentioned this to his nephrologist Dr. Justin Mend who increased labetolol dose to 200 mg PO BID.  In the past week, patient has been feeling short of breath with some associated wheezing.  He got a nebulizer treatment in the ED and did not feel short of breath at rest after.   He reports good PO intake and did take his BP meds last night (amlodipine at dinnertime, labetalol at 11p).  His BP was quite elevated at presentation (218/113) so he was started on a nitroglycerine gtt and BP trended down to [id number] systolic.     Denies fever/chills, chest pain, palpitations, abdominal pain, change in bowel or bladder habits, lightheadedness, numbness/tingling.   Meds: Current Facility-Administered Medications  Medication Dose Route Frequency Provider Last Rate Last Dose  . nitroGLYCERIN 0.2 mg/mL in dextrose 5 % infusion  5 mcg/min Intravenous Titrated Teressa Lower, MD   5 mcg/min at 06/12/13 [id number]   Current Outpatient Prescriptions  Medication Sig Dispense Refill  . albuterol (PROVENTIL HFA;VENTOLIN HFA) 108 (90 BASE) MCG/ACT inhaler Inhale 2 puffs into the lungs every 4 (four) hours as needed. For shortness of breath      . ALPRAZolam (XANAX) 0.5 MG tablet Take 1 tablet (0.5 mg total) by mouth 2 (two) times daily as needed for sleep.  60 tablet  2  . amLODipine (NORVASC) 10 MG tablet Take 10 mg by mouth 2 (two) times daily.      . calcitRIOL (ROCALTROL) 0.25 MCG capsule Take 0.25 mcg by mouth 2 (two) times daily.      . Calcium Acetate 667 MG TABS Take 1,334-2,001 mg by mouth 3 (three) times daily with meals. Takes 3 capsules with meals and 2 capsules with snacks      . cinacalcet (SENSIPAR) 30 MG tablet Take 30 mg by mouth daily.      Marland Kitchen  hydrALAZINE (APRESOLINE) 25 MG tablet Take 1 tablet (25 mg total) by mouth 3 (three) times daily.  90 tablet  0  . labetalol (NORMODYNE) 200 MG tablet Take 200 mg by mouth 2 (two) times daily.       Marland Kitchen lamivudine (EPIVIR-HBV) 5 MG/ML solution Take 5 mLs (25 mg total) by mouth daily.  240 mL  3  . multivitamin (RENA-VIT) TABS tablet Take 1 tablet by mouth daily.        . raltegravir (ISENTRESS) 400 MG tablet Take 1 tablet (400 mg total) by mouth 2 (two) times daily.  60 tablet  11  . tenofovir (VIREAD) 300 MG tablet Take 300 mg by mouth every 7 (seven) days. On Saturdays      . traZODone (DESYREL) 50 MG tablet Take 1 tablet (50 mg total) by mouth at bedtime.  30 tablet  1    Allergies: Allergies as of 06/12/2013 - Review Complete 06/12/2013  Allergen Reaction Noted  . Lisinopril   08/31/2012  . Penicillins Other (See Comments)    Past Medical History  Diagnosis Date  . Hypertension   . Gout   . Anemia   . Seizures 06/02/2011  . Thrombocytopenia   . Anxiety   . Hepatitis B carrier   . HIV infection   . Seizure   . End stage renal disease on home HD 07/10/2011    Started HD in September 2012 at [facility] with a tunneled HD catheter. Now dialyzing through AVF L lower arm with buttonhole technique. Is doing home hemodialysis, his brother took the training course and is doing the HD treatments at home on Mr Concha.  They are roommates for 23 years.  His brother works 3rd shift and gets off at about [place] and then puts Mr [name] on HD in the morning after getting home. He does dialyze on a typical schedule, but most of the time he does HD about 4 times a week, for about 4 hours per treatment. Cause of ESRD was HTN according to patient. He says he let his health go and ending up with complications, that he didn't like seeing doctors in those days.  He says he was diagnosed with HTN when he lived in [place] in his 45's, he had a severe headache and BP was 220/130 in the ED.  BP responded well to medication.    . Asthma     per pt hx   Past Surgical History  Procedure Laterality Date  . Av fistula placement  06/02/11    Left radiocephalic AVF  . Other surgical history      removal temporary HD catheter    Family History  Problem Relation Age of Onset  . Hypertension Mother   . Cancer Father    History   Social History  . Marital Status: Single    Spouse Name: N/A    Number of Children: N/A  . Years of Education: N/A   Occupational History  . Not on file.   Social History Main Topics  . Smoking status: Current Some Day Smoker    Types: Cigarettes  . Smokeless tobacco: Never Used     Comment: not ready to quit yet  . Alcohol Use: No  . Drug Use: No     Comment: uds (+) cocaine in 08/2011 but pt states was taking sudafed at the time  . Sexual Activity: Yes     Partners: Male     Comment: pt. declined condoms   Other Topics  Concern  . 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 Negative for fever, chills and weight loss.  Eyes: Positive for blurred vision.  Respiratory: Positive for cough and shortness of breath. Negative for hemoptysis, sputum production and wheezing.   Cardiovascular: Positive for orthopnea. Negative for chest pain, palpitations and leg swelling.  Gastrointestinal: Negative for nausea, vomiting, abdominal pain, diarrhea, constipation and blood in stool.  Genitourinary: Negative for dysuria, urgency and hematuria.  Musculoskeletal: Negative for falls.  Skin: Negative for rash.  Neurological: Negative for dizziness, focal weakness, weakness and headaches.    Physical Exam: Blood pressure 195/103, pulse 78, temperature 98.2 F (36.8 C), temperature source Oral, resp. rate 18, weight 195 lb (88.451 kg), SpO2 93.00%. General: alert, cooperative, and in no apparent distress HEENT: NCAT, vision grossly intact, oropharynx clear and non-erythematous  Neck: supple, no lymphadenopathy Lungs: crackles and mild end expiratory wheezes at bilateral bases, normal work of respiration; pt no longer feels short of breath Heart: regular rate and rhythm, no murmurs, gallops, or rubs Abdomen: soft, non-tender, non-distended, normal bowel sounds Extremities: no cyanosis, clubbing, or edema Neurologic: alert & oriented X3, cranial nerves II-XII intact, strength grossly intact, sensation intact to light touch   Lab results: Basic Metabolic Panel:  Recent Labs  06/12/13 0311  NA 137  K 3.1*  CL 93*  GLUCOSE 91  BUN 42*  CREATININE 9.80*   CBC:  Recent Labs  06/12/13 0304  06/12/13 0311  WBC 4.0  --   HGB 9.3* 9.9*  HCT 27.0* 29.0*  MCV 87.1  --   PLT 98*  --     Imaging results:  Dg Chest 2 View  06/12/2013   CLINICAL DATA:  Shortness of breath in cough for 2 days  EXAM: CHEST  2 VIEW  COMPARISON:  Chest x-ray from earlier the same day  FINDINGS: Cardiomegaly stable from before. Symmetric bilateral interstitial opacities. There is linear scarring or accessory fissure at the right base, stable from prior. No effusion or pneumothorax.  Left glenohumeral osteoarthritis with sclerotic humeral head.  IMPRESSION: Bronchitic changes which could represent developing CHF or atypical infection.   Electronically Signed   By: Jorje Guild   On: 06/12/2013 06:17   Dg Chest Portable 1 View  06/12/2013   CLINICAL DATA:  Hypertension.  EXAM: PORTABLE CHEST - 1 VIEW  COMPARISON:  PA and lateral chest 01/09/2013.  FINDINGS: There is cardiomegaly and vascular congestion without frank edema. No consolidative process, pneumothorax or effusion is identified.  IMPRESSION: Cardiomegaly and pulmonary vascular congestion. No acute process.   Electronically Signed   By: Inge Rise M.D.   On: 06/12/2013 03:11    Other results: EKG: sinus rhythm, first degree AV block, ?right atrial abnormality, left atrial enlargement, ?LVH, no ST changes  Assessment & Plan by Problem: #Hypertensive urgency- BP 218/113 on presentation, started on nitroglycerine gtt in ED; BP trending down, 176/84 during interview.  Do not want BP to decrease much further at this point, will transition back to PO home meds.  -telemetry -transition to home meds (labetelol 200 mg BID, amlodipine 10 mg BID, hydralazine 25 mg TID) then d/c nitroglycerine gtt per pharmacy recs -troponin x 1  ordered -renal to make BP recommendations  #Fluid overload due to ESRD (on home HD)- vascular congestion seen on initial CXR due to volume overload from ESRD. Follows with Dr. Justin Mend (though pt requesting new nephrologist at this  time).  On home HD every other day via buttonhole fistula in left lower arm (though he is supposed to do 5 days/week). Likely not taking enough fluid off as he weighs himself and thinks his scale and BP cuff at home are broken. Cr 9.8 on admission, unclear baseline. Echo in 4/14 showed EF 60-65%, no wall motion abnomalities, bilateral atrial enlargement (left atrium moderate, right atrium mild). Portable CXR in ED with stable cardiomegaly, pulmonary venous congestion. Repeat PA and lateral CXR showed "bronchitic changes which could represent developing CHF or atypical infection."  On exam, crackles and wheezes bilaterally, no lower extremity edema. Pt got Duoneb in the ED and felt better after, has reported history of asthma.  Of note, pt's HD machine has been broken in past though he thinks it has been working (recently submitted diasylate and urine samples).  -home albuterol and nebulizer q4h prn  -daily weights (today 88.k kg), Is and Os -orthostatic vital signs -renal on board, likely dialysis today -renal diet -consult to [facility] for help with dialysis -consider outpatient PFTs as pt states that he has asthma  #Shortness of breath- likely multifactorial, volume overload vs. Asthma vs. Infection. SOB x 1 week with associated dry cough; see above. Repeat PA and lateral CXR with possible infection therefore will start abx.  Pt states he lights a cigarette from time to time but doesn't smoke.  -doxycycline 100 PO BID given findings on PA and lateral CXR -pulse ox with ambulation  #Hypokalemia- K 3.1, will not supplement given HD today.   # HIV, controlled and HBV- follows with Dr. Johnnye Sima; CD4 210, viral load <20 on 04/24/13. Reports good medication compliance with anti-retrovirals.  -continue home meds raltegravir 400 mg BID, lamivudine 25 mg daily, tenofavir 300 mg qSat  # Normocytic anemia and thrombocytopenia, chronic- stable. Hgb 9.9 (baseline 10-12), platelets 98 (86 at last admission), likely  related to chronic disease. Pt takes Epogen at home.  -trend CBC  #Tobacco abuse- 1 pack lasts months -smoking cessation counseling -UDS given history of UDS + for cocaine  #DVT PPX- heparin (given ESRD); platelets stable so ok to give heparin  #Code status- Full code  Dispo: Disposition is deferred at this time, awaiting improvement of current medical problems. Anticipated discharge in approximately 3-4 day(s).   The patient does have a current PCP Campbell Riches, MD) and does need an [facility] hospital follow-up appointment after discharge.   Signed: Ivin Poot, MD 06/12/2013, 7:52 AM

## 2013-06-12 NOTE — Consult Note (Signed)
Renal Service Consult Note [name]  Jeffrey [name] 06/12/2013 Jeffrey [name] D Requesting Physician:  Dr Aundra Dubin  Reason for Consult:  ESRD pt with  HPI: The patient is a 57 y.o. year-old with hx of HIV and ESRD on home HD presented with SOB and HTN urgency.  BP's have been remaining high even after HD.  Dialyzes about 4-5 days per wk.  BP meds changed from coreg to labetalol within past month or two without effect per pt.  BP in ED was up at 218/113.  Today pt feels a little better.   ROS  no fever, prod cough or CP  no abd pain, n/v/d  no intentional wt loss  no confusion  no skin rash or jt pain  Past Medical History  Past Medical History  Diagnosis Date  . Hypertension   . Gout   . Anemia   . Seizures 06/02/2011  . Thrombocytopenia   . Anxiety   . Hepatitis B carrier   . HIV infection   . Seizure   . Asthma     per pt hx  . End stage renal disease on home HD 07/10/2011    Started HD in September 2012 at [facility] with a tunneled HD catheter, now on home HD with [facility]. Dialyzing through AVF L lower arm with buttonhole technique as of mid 2014. His brother does the HD treatments at home.  They are roommates for 23 years.  The brother works 3rd shift and gets off about 8am and then puts Jeffrey [name] on HD in the morning after getting home. Most of the time he does HD about 4 times a week, for about 4 hours per treatment. Cause of ESRD was HTN according to patient. He says he let his health go and ending up with complications, and that he didn't like seeing doctors in those days.  He says he was diagnosed with severe HTN when he lived in [place] in his 84's.    Past Surgical History  Past Surgical History  Procedure Laterality Date  . Av fistula placement  06/02/11    Left radiocephalic AVF  . Other surgical history      removal temporary HD catheter    Family History  Family History  Problem Relation Age of Onset  . Hypertension Mother   . Cancer Father     Social History  reports that he has been smoking Cigarettes.  He has been smoking about 0.00 packs per day. He has never used smokeless tobacco. He reports that he does not drink alcohol or use illicit drugs. Allergies  Allergies  Allergen Reactions  . Lisinopril     Throat swelling  . Penicillins Other (See Comments)    Childhood allergy   Home medications Prior to Admission medications   Medication Sig Start Date End Date Taking? Authorizing Provider  albuterol (PROVENTIL HFA;VENTOLIN HFA) 108 (90 BASE) MCG/ACT inhaler Inhale 2 puffs into the lungs every 4 (four) hours as needed. For shortness of breath 01/09/13  Yes Dorothy Spark, MD  ALPRAZolam Duanne Moron) 0.5 MG tablet Take 1 tablet (0.5 mg total) by mouth 2 (two) times daily as needed for sleep. 05/11/13  Yes Campbell Riches, MD  amLODipine (NORVASC) 10 MG tablet Take 10 mg by mouth 2 (two) times daily.   Yes Historical Provider, MD  calcitRIOL (ROCALTROL) 0.25 MCG capsule Take 0.25 mcg by mouth 2 (two) times daily.   Yes Historical Provider, MD  Calcium Acetate 667 MG TABS Take  1,334-2,001 mg by mouth 3 (three) times daily with meals. Takes 3 capsules with meals and 2 capsules with snacks   Yes Historical Provider, MD  cinacalcet (SENSIPAR) 30 MG tablet Take 30 mg by mouth daily.   Yes Historical Provider, MD  hydrALAZINE (APRESOLINE) 25 MG tablet Take 1 tablet (25 mg total) by mouth 3 (three) times daily. 09/19/12  Yes Orpah Greek, MD  labetalol (NORMODYNE) 200 MG tablet Take 200 mg by mouth 2 (two) times daily.    Yes Historical Provider, MD  lamivudine (EPIVIR-HBV) 5 MG/ML solution Take 5 mLs (25 mg total) by mouth daily. 04/18/13  Yes Campbell Riches, MD  multivitamin (RENA-VIT) TABS tablet Take 1 tablet by mouth daily.     Yes Historical Provider, MD  raltegravir (ISENTRESS) 400 MG tablet Take 1 tablet (400 mg total) by mouth 2 (two) times daily. 06/27/12  Yes Thayer Headings, MD  tenofovir (VIREAD) 300 MG tablet Take  300 mg by mouth every 7 (seven) days. On Saturdays   Yes Historical Provider, MD  traZODone (DESYREL) 50 MG tablet Take 1 tablet (50 mg total) by mouth at bedtime. 04/05/13  Yes Campbell Riches, MD   Liver Function Tests  Recent Labs Lab 06/12/13 1025  AST 15  ALT 10  ALKPHOS 62  BILITOT 0.7  PROT 8.3  ALBUMIN 3.7  3.8   No results found for this basename: LIPASE, AMYLASE,  in the last 168 hours CBC  Recent Labs Lab 06/12/13 0304 06/12/13 0311  WBC 4.0  --   HGB 9.3* 9.9*  HCT 27.0* 29.0*  MCV 87.1  --   PLT 98*  --    Basic Metabolic Panel  Recent Labs Lab 06/12/13 0311 06/12/13 1025  NA 137 132*  K 3.1* 3.0*  CL 93* 90*  CO2  --  30  GLUCOSE 91 119*  BUN 42* 45*  CREATININE 9.80* 10.67*  CALCIUM  --  9.6  PHOS  --  3.0    Physical Exam:  Blood pressure 169/86, pulse 78, temperature 98.4 F (36.9 C), temperature source Oral, resp. rate 21, height 6' (1.829 m), weight 85.6 kg (188 lb 11.4 oz), SpO2 94.00%. Gen: alert, no distress, healthy appearing AAM Skin: no rash, cyanosis HEENT:  EOMI, sclera anicteric, throat clear Neck: no JVD, no LAN Chest: bilat insp rales 1/3 - 1/2 up post, worse on L CV: regular, pedal pulses intact, no gallop or rub Abdomen: soft, nontender, no mass or HSM Ext: no LE edema, no joint effusion, no gangrene/ulcers Neuro: alert, Ox3, nonfocal Access:   Outpatient HD: ([facility] Home HD) 3-4 hr treatments 5days/wk, usually  M-W-Th-Sat-Sun   Dry wt 88kg   Heparin 6000 bolus  Jeffrey [name] none    EPO 20,000 units with each HD    Venofer 200 mg monthly, next dose 9/23  Assessment: 1. HTN crisis- pt also SOB with rales and signs of volume overload despite being 2kg below dry wt, suspect he has lost body weight 2. ESRD , on home HD, started HD in 2012 3. Anemia on EPO high dose tiw at home, Hb 9.9, will give low dose darbe today 4. Metabolic bone disease- cont phoslo, sensipar 5. HTN/volume - vol xs, lower dry wt, cont BP  meds 6. HIV- on ART  Plan- HD today, max UF to 4kg as tol, lower dry wt, follow symptoms and BP for response, darbe 25ug today, dose IV Fe today with HD  Jeffrey Splinter  MD Pager [phone]  Cell  [phone] 06/12/2013, 12:11 PM

## 2013-06-13 ENCOUNTER — Inpatient Hospital Stay (HOSPITAL_COMMUNITY): Payer: Medicare Other

## 2013-06-13 DIAGNOSIS — E8779 Other fluid overload: Secondary | ICD-10-CM | POA: Diagnosis not present

## 2013-06-13 DIAGNOSIS — N186 End stage renal disease: Secondary | ICD-10-CM | POA: Diagnosis not present

## 2013-06-13 DIAGNOSIS — I129 Hypertensive chronic kidney disease with stage 1 through stage 4 chronic kidney disease, or unspecified chronic kidney disease: Secondary | ICD-10-CM | POA: Diagnosis not present

## 2013-06-13 DIAGNOSIS — D696 Thrombocytopenia, unspecified: Secondary | ICD-10-CM | POA: Diagnosis not present

## 2013-06-13 DIAGNOSIS — I120 Hypertensive chronic kidney disease with stage 5 chronic kidney disease or end stage renal disease: Secondary | ICD-10-CM | POA: Diagnosis not present

## 2013-06-13 DIAGNOSIS — B20 Human immunodeficiency virus [HIV] disease: Secondary | ICD-10-CM

## 2013-06-13 DIAGNOSIS — J811 Chronic pulmonary edema: Secondary | ICD-10-CM | POA: Diagnosis not present

## 2013-06-13 LAB — RENAL FUNCTION PANEL
BUN: 23 mg/dL (ref 6–23)
CO2: 31 mEq/L (ref 19–32)
Calcium: 9.3 mg/dL (ref 8.4–10.5)
Chloride: 99 mEq/L (ref 96–112)
Creatinine, Ser: 6.69 mg/dL — ABNORMAL HIGH (ref 0.50–1.35)
GFR calc non Af Amer: 8 mL/min — ABNORMAL LOW (ref >90)
Glucose, Bld: 83 mg/dL (ref 70–99)
Potassium: 4 mEq/L (ref 3.5–5.1)

## 2013-06-13 LAB — MAGNESIUM: Magnesium: 1.9 mg/dL (ref 1.5–2.5)

## 2013-06-13 MED ORDER — LIDOCAINE HCL (PF) 1 % IJ SOLN: 5.0000 mL | INTRAMUSCULAR | Status: DC | PRN

## 2013-06-13 MED ORDER — LIDOCAINE-PRILOCAINE 2.5-2.5 % EX CREA: 1.0000 "application " | TOPICAL_CREAM | CUTANEOUS | Status: DC | PRN

## 2013-06-13 MED ORDER — SODIUM CHLORIDE 0.9 % IV SOLN: 100.0000 mL | INTRAVENOUS | Status: DC | PRN

## 2013-06-13 MED ORDER — PSYLLIUM 95 % PO PACK
1.0000 | PACK | Freq: Once | ORAL | Status: AC
  Administered 2013-06-13: 1 via ORAL
  Filled 2013-06-13: qty 1

## 2013-06-13 MED ORDER — HEPARIN SODIUM (PORCINE) 1000 UNIT/ML DIALYSIS: 6000.0000 [IU] | Freq: Once | INTRAMUSCULAR | Status: DC

## 2013-06-13 MED ORDER — NEPRO/CARBSTEADY PO LIQD: 237.0000 mL | ORAL | Status: DC | PRN

## 2013-06-13 MED ORDER — ALTEPLASE 2 MG IJ SOLR
2.0000 mg | Freq: Once | INTRAMUSCULAR | Status: DC | PRN
  Filled 2013-06-13: qty 2

## 2013-06-13 MED ORDER — SORBITOL 70 % SOLN
30.0000 mL | Freq: Every day | Status: DC | PRN
Start: 2013-06-13 — End: 2013-06-14
  Filled 2013-06-13 (×2): qty 30

## 2013-06-13 MED ORDER — HEPARIN SODIUM (PORCINE) 1000 UNIT/ML DIALYSIS
1000.0000 [IU] | INTRAMUSCULAR | Status: DC | PRN
  Filled 2013-06-13: qty 1

## 2013-06-13 MED ORDER — BISACODYL 5 MG PO TBEC
10.0000 mg | DELAYED_RELEASE_TABLET | ORAL | Status: AC
  Administered 2013-06-13: 10 mg via ORAL
  Filled 2013-06-13: qty 2

## 2013-06-13 MED ORDER — WHITE PETROLATUM GEL
Status: AC
  Filled 2013-06-13: qty 5

## 2013-06-13 MED ORDER — LIDOCAINE-PRILOCAINE 2.5-2.5 % EX CREA
1.0000 "application " | TOPICAL_CREAM | CUTANEOUS | Status: DC | PRN
  Filled 2013-06-13: qty 5

## 2013-06-13 MED ORDER — HEPARIN SODIUM (PORCINE) 1000 UNIT/ML DIALYSIS: 1000.0000 [IU] | INTRAMUSCULAR | Status: DC | PRN

## 2013-06-13 MED ORDER — PENTAFLUOROPROP-TETRAFLUOROETH EX AERO: 1.0000 "application " | INHALATION_SPRAY | CUTANEOUS | Status: DC | PRN

## 2013-06-13 MED ORDER — PSYLLIUM 95 % PO PACK
1.0000 | PACK | Freq: Every day | ORAL | Status: DC
  Filled 2013-06-13: qty 1

## 2013-06-13 MED ORDER — IPRATROPIUM BROMIDE 0.02 % IN SOLN: 0.5000 mg | RESPIRATORY_TRACT | Status: DC | PRN

## 2013-06-13 MED ORDER — ALBUTEROL SULFATE (5 MG/ML) 0.5% IN NEBU
2.5000 mg | INHALATION_SOLUTION | RESPIRATORY_TRACT | Status: DC
  Administered 2013-06-13 – 2013-06-14 (×4): 2.5 mg via RESPIRATORY_TRACT
  Filled 2013-06-13 (×4): qty 0.5

## 2013-06-13 MED ORDER — HEPARIN SODIUM (PORCINE) 1000 UNIT/ML DIALYSIS
6000.0000 [IU] | Freq: Once | INTRAMUSCULAR | Status: AC
  Administered 2013-06-13: 6000 [IU] via INTRAVENOUS_CENTRAL
  Filled 2013-06-13: qty 6

## 2013-06-13 MED ORDER — ALTEPLASE 2 MG IJ SOLR: 2.0000 mg | Freq: Once | INTRAMUSCULAR | Status: DC | PRN

## 2013-06-13 NOTE — Progress Notes (Signed)
Renal Service Daily Progress Note [facility]  Subjective: Tolerated 4kg UF yest with no bp drop and no cramping, BP still high near end of Rx, came off at 84kg, 4 kg below old dry wt  Physical Exam:  Blood pressure 164/87, pulse 68, temperature 98.9 F (37.2 C), temperature source Oral, resp. rate 16, height 6' (1.829 m), weight 83.9 kg (184 lb 15.5 oz), SpO2 95.00%. Gen: alert, no distress, healthy appearing AAM  Neck: no JVD, no LAN  Chest: crackles R base, L clear today CV: regular, pedal pulses intact, no gallop or rub  Abdomen: soft, nontender, no mass or HSM  Ext: no LE edema, no joint effusion, no gangrene/ulcers  Neuro: alert, Ox3, nonfocal  Access: AVF patent    Outpatient HD: ([facility] Home HD)  3-4 hr treatments 5days/wk, usually M-W-Th-Sat-Sun Dry wt 88kg Heparin 6000 bolus  Hectorol none EPO 20,000 units with each HD Venofer 200 mg monthly, next dose 9/23   Assessment:  1. HTN crisis / mild pulm edema- due to vol xs from lean body wt loss, HD again today and may be able to go home after HD 2. ESRD, on home HD, started HD in 2012, HD 5x/wk 3. Anemia on EPO high dose tiw at home, Hb 9.9, will give low dose darbe today 4. Metabolic bone disease- cont phoslo, sensipar 5. HTN/volume - vol xs, lower dry wt, cont BP meds 6. HIV- on ART   Plan- HD today, possible d/c after HD if stable  Kelly Splinter  MD Pager [phone]    Cell  [phone] 06/13/2013, 8:02 AM    Recent Labs Lab 06/12/13 0311 06/12/13 1025 06/13/13 0350  NA 137 132* 139  K 3.1* 3.0* 4.0  CL 93* 90* 99  CO2  --  30 31  GLUCOSE 91 119* 83  BUN 42* 45* 23  CREATININE 9.80* 10.67* 6.69*  CALCIUM  --  9.6 9.3  PHOS  --  3.0 2.3    Recent Labs Lab 06/12/13 1025 06/13/13 0350  AST 15  --   ALT 10  --   ALKPHOS 62  --   BILITOT 0.7  --   PROT 8.3  --   ALBUMIN 3.7  3.8 3.7    Recent Labs Lab 06/12/13 0304 06/12/13 0311  WBC 4.0  --   HGB 9.3* 9.9*  HCT 27.0* 29.0*   MCV 87.1  --   PLT 98*  --    . amLODipine  10 mg Oral BID  . calcitRIOL  0.25 mcg Oral BID  . calcium acetate  2,001 mg Oral TID WC  . cinacalcet  30 mg Oral Q breakfast  . darbepoetin  25 mcg Intravenous Q Mon-HD  . heparin  5,000 Units Subcutaneous Q8H  . hydrALAZINE  25 mg Oral TID  . influenza vac split quadrivalent PF  0.5 mL Intramuscular Tomorrow-1000  . labetalol  200 mg Oral BID  . lamiVUDine  25 mg Oral Daily  . multivitamin  1 tablet Oral QHS  . pentafluoroprop-tetrafluoroeth      . raltegravir  400 mg Oral BID  . sodium chloride  3 mL Intravenous Q12H  . [START ON 06/17/2013] tenofovir  300 mg Oral Q Sat  . traZODone  50 mg Oral QHS     sodium chloride, albuterol, albuterol, ALPRAZolam, calcium acetate, sodium chloride

## 2013-06-13 NOTE — Progress Notes (Signed)
  Date: 06/13/2013  Patient name: Jeffrey [name]  Medical record number: [id number]  Date of birth: 1955/11/17   This patient has been seen and the plan of care was discussed with the house staff. Please see their note for complete details. I concur with their findings with the following additions/corrections: Mr Dufour feels better but his breathing is not yet back to a good day just quite yet. He says he goes through one alb MDI in 6 months which would indicate good control of his asthma. We are getting inpt, in room PFT's today to see if he might also have a component of COPD despite his limited tobacco use and would need expanded bronchodilators. Renal says possible D/C today after HD.    Bartholomew Crews, MD 06/13/2013, 1:02 PM

## 2013-06-13 NOTE — Progress Notes (Signed)
Subjective: Patient feels less SOB, but reports wheezing this morning. He feels as though he needs a neb tx. He still reports having a dry cough, but feels as though the neb tx makes it "feel like something is coming up." He also reports he feels like he needs to have a BM, but has not had one in three days, no abd pain or nausea. Patient had HD yesterday and today is 83.9kg, down from yesterday's weight of 86.8kg (4kg down from his reported dry weight). Patient to be dialyzed again today with possible discharge after HD if he is doing well.   Objective: Vital signs in last 24 hours: Filed Vitals:   06/13/13 0500 06/13/13 0858 06/13/13 0900 06/13/13 1150  BP: 164/87  162/89 133/57  Pulse: 68   77  Temp: 98.9 F (37.2 C)  98.6 F (37 C) 99.3 F (37.4 C)  TempSrc: Oral   Oral  Resp: 16   18  Height:      Weight: 83.9 kg (184 lb 15.5 oz)     SpO2: 95% 100%  95%   Weight change: -2.851 kg (-6 lb 4.6 oz)  Intake/Output Summary (Last 24 hours) at 06/13/13 1303 Last data filed at 06/12/13 2127  Gross per 24 hour  Intake    243 ml  Output   4000 ml  Net  -3757 ml    Physical Exam General: lying flat in bed, he is alert, cooperative, and in no apparent distress HEENT: NCAT, vision grossly intact Neck: supple Lungs: crackles at bilateral bases with scattered wheezes throughout; normal WOB Heart: regular rate and rhythm, no murmurs, gallops, or rubs Abdomen: soft, non-tender, non-distended, normal bowel sounds  Extremities: no BLE edema, warm extremities with good perfusion Neurologic: alert & oriented X3, cranial nerves II-XII grossly intact, strength and sensation is grossly intact  Lab Results: Basic Metabolic Panel:  Recent Labs Lab 06/12/13 1025 06/13/13 0350  NA 132* 139  K 3.0* 4.0  CL 90* 99  CO2 30 31  GLUCOSE 119* 83  BUN 45* 23  CREATININE 10.67* 6.69*  CALCIUM 9.6 9.3  MG  --  1.9  PHOS 3.0 2.3   Liver Function Tests:  Recent Labs Lab 06/12/13 1025  06/13/13 0350  AST 15  --   ALT 10  --   ALKPHOS 62  --   BILITOT 0.7  --   PROT 8.3  --   ALBUMIN 3.7  3.8 3.7   CBC:  Recent Labs Lab 06/12/13 0304 06/12/13 0311  WBC 4.0  --   HGB 9.3* 9.9*  HCT 27.0* 29.0*  MCV 87.1  --   PLT 98*  --    Cardiac Enzymes:  Recent Labs Lab 06/12/13 1025  TROPONINI <0.30   Urine Drug Screen: Drugs of Abuse     Component Value Date/Time   LABOPIA NONE DETECTED 06/02/2011 0948   COCAINSCRNUR POSITIVE* 06/02/2011 0948   LABBENZ NONE DETECTED 06/02/2011 0948   AMPHETMU NONE DETECTED 06/02/2011 0948   THCU NONE DETECTED 06/02/2011 0948   LABBARB NONE DETECTED 06/02/2011 0948     Micro Results: Recent Results (from the past 240 hour(s))  MRSA PCR SCREENING     Status: None   Collection Time    06/12/13  9:29 AM      Result Value Range Status   MRSA by PCR NEGATIVE  NEGATIVE Final   Comment:            The GeneXpert MRSA Assay (FDA  approved for NASAL specimens     only), is one component of a     comprehensive MRSA colonization     surveillance program. It is not     intended to diagnose MRSA     infection nor to guide or     monitor treatment for     MRSA infections.   Studies/Results: Dg Chest 2 View  06/12/2013   CLINICAL DATA:  Shortness of breath in cough for 2 days  EXAM: CHEST  2 VIEW  COMPARISON:  Chest x-ray from earlier the same day  FINDINGS: Cardiomegaly stable from before. Symmetric bilateral interstitial opacities. There is linear scarring or accessory fissure at the right base, stable from prior. No effusion or pneumothorax.  Left glenohumeral osteoarthritis with sclerotic humeral head.  IMPRESSION: Bronchitic changes which could represent developing CHF or atypical infection.   Electronically Signed   By: Jorje Guild   On: 06/12/2013 06:17   Dg Chest Portable 1 View  06/12/2013   CLINICAL DATA:  Hypertension.  EXAM: PORTABLE CHEST - 1 VIEW  COMPARISON:  PA and lateral chest 01/09/2013.  FINDINGS: There is  cardiomegaly and vascular congestion without frank edema. No consolidative process, pneumothorax or effusion is identified.  IMPRESSION: Cardiomegaly and pulmonary vascular congestion. No acute process.   Electronically Signed   By: Inge Rise M.D.   On: 06/12/2013 03:11   Medications: I have reviewed the patient's current medications. Scheduled Meds: . albuterol  2.5 mg Nebulization Q4H  . amLODipine  10 mg Oral BID  . bisacodyl  10 mg Oral NOW  . calcitRIOL  0.25 mcg Oral BID  . calcium acetate  2,001 mg Oral TID WC  . cinacalcet  30 mg Oral Q breakfast  . darbepoetin  25 mcg Intravenous Q Mon-HD  . heparin  5,000 Units Subcutaneous Q8H  . hydrALAZINE  25 mg Oral TID  . influenza vac split quadrivalent PF  0.5 mL Intramuscular Tomorrow-1000  . labetalol  200 mg Oral BID  . lamiVUDine  25 mg Oral Daily  . multivitamin  1 tablet Oral QHS  . psyllium  1 packet Oral Daily  . raltegravir  400 mg Oral BID  . sodium chloride  3 mL Intravenous Q12H  . [START ON 06/17/2013] tenofovir  300 mg Oral Q Sat  . traZODone  50 mg Oral QHS   Continuous Infusions:  PRN Meds:.sodium chloride, albuterol, ALPRAZolam, calcium acetate, ipratropium, sodium chloride, sorbitol  Assessment/Plan: #Hypertensive urgency- BP 218/113 on presentation, started on nitroglycerine gtt in ED, which patient's blood pressure responded to and went down to 176/84 at which point NTG drip was discontinued and patient's home medications were restarted. BPs 164-180/85-103 overnight and now at 133/57.  -telemetry  -continue home medications: labetelol 200 mg BID, amlodipine 10 mg BID, hydralazine 25 mg TID -troponin x 1 negative -EKG without ischemic changes -renal to make BP recommendations   #Fluid overload due to ESRD (on home HD)-  Follows with Dr. Justin Mend (though pt requesting new nephrologist at this time). On home HD every other day via buttonhole fistula in left lower arm (though he is supposed to do 5 days/week).  Likely not taking enough fluid off as he weighs himself and thinks his scale and BP cuff at home are broken. Cr 9.8 on admission, unclear baseline. Today Cr is 6.69 after HD. Today still with bibasilar crackles, but no BLE edema. Echo in 4/14 showed EF 60-65%, no wall motion abnomalities, bilateral atrial enlargement. Portable CXR in  ED with stable cardiomegaly, pulmonary venous congestion w/ repeat PA and lateral CXR showing bronchitic changes which could represent developing CHF vs atypical infection. Pt got Duoneb in the ED and felt better after, has reported history of asthma. Of note, pt's HD machine has been broken in past though he thinks it has been working (recently submitted diasylate and urine samples).  -duoneb q4h prn (had been managed on albuterol only until today) -PFTs either inpatient or outpatient -daily weights (today weight is 83.9kg, 88.5 kg dry weight) -Is and Os  -renal on board, HD done yesterday and will have another session today  -renal diet  -[facility] consult -possible discharge today if stable   #Shortness of breath and dry cough- Likely multifactorial with both volume overload and asthma contributing. SOB x 1 week with associated dry cough. CXR showed possibility of infection, though pt is afebrile, without other signs of infection therefore the antibiotics that were started upon admission were discontinued. Pt states he lights a cigarette from time to time but doesn't smoke.  -monitor pulse ox  -PFTs- either inpatient or outpatient -duonebs q4h prn  #Hypokalemia- K 3.1 on admission and after HD yesterday K rose to 4.0.  # HIV, controlled and HBV- Pt follows with Dr. Johnnye Sima. Most recent CD4 210, viral load <20 on 04/24/13. Reports good medication compliance with ART.  -continue home meds raltegravir 400 mg BID, lamivudine 25 mg daily, tenofavir 300 mg qSat   # Normocytic anemia and thrombocytopenia, chronic- Stable. Hgb 9.9 (baseline 10-12), platelets 98 (86 at last  admission), likely related to chronic disease. Pt takes Epogen at home.   #Tobacco abuse and hx of cocaine use- 1 pack lasts months  -smoking cessation counseling  -UDS pending  #DVT PPX- heparin (given ESRD)  #Code status- Full code   Dispo: Discharge possibly today after HD if renal agrees.  The patient does have a current PCP Campbell Riches, MD) and does not need an [facility] hospital follow-up appointment after discharge.  The patient does not have transportation limitations that hinder transportation to clinic appointments.  .Services Needed at time of discharge: Y = Yes, Blank = No PT:   OT:   RN:   Equipment:   Other:     LOS: 1 day   Rebecca Eaton, MD 06/13/2013, 1:03 PM

## 2013-06-14 DIAGNOSIS — N186 End stage renal disease: Secondary | ICD-10-CM | POA: Diagnosis not present

## 2013-06-14 DIAGNOSIS — J811 Chronic pulmonary edema: Secondary | ICD-10-CM | POA: Diagnosis not present

## 2013-06-14 DIAGNOSIS — I120 Hypertensive chronic kidney disease with stage 5 chronic kidney disease or end stage renal disease: Principal | ICD-10-CM

## 2013-06-14 DIAGNOSIS — D696 Thrombocytopenia, unspecified: Secondary | ICD-10-CM

## 2013-06-14 DIAGNOSIS — E876 Hypokalemia: Secondary | ICD-10-CM

## 2013-06-14 DIAGNOSIS — B20 Human immunodeficiency virus [HIV] disease: Secondary | ICD-10-CM | POA: Diagnosis not present

## 2013-06-14 DIAGNOSIS — E8779 Other fluid overload: Secondary | ICD-10-CM | POA: Diagnosis not present

## 2013-06-14 MED ORDER — LIDOCAINE-PRILOCAINE 2.5-2.5 % EX CREA: 1.0000 "application " | TOPICAL_CREAM | CUTANEOUS | Status: DC | PRN

## 2013-06-14 MED ORDER — HEPARIN SODIUM (PORCINE) 1000 UNIT/ML DIALYSIS: 1000.0000 [IU] | INTRAMUSCULAR | Status: DC | PRN

## 2013-06-14 MED ORDER — SODIUM CHLORIDE 0.9 % IV SOLN: 100.0000 mL | INTRAVENOUS | Status: DC | PRN

## 2013-06-14 MED ORDER — HEPARIN SODIUM (PORCINE) 1000 UNIT/ML DIALYSIS: 6000.0000 [IU] | Freq: Once | INTRAMUSCULAR | Status: DC

## 2013-06-14 MED ORDER — HYDRALAZINE HCL 25 MG PO TABS: 50.0000 mg | ORAL_TABLET | Freq: Three times a day (TID) | ORAL | Status: DC

## 2013-06-14 MED ORDER — PENTAFLUOROPROP-TETRAFLUOROETH EX AERO: 1.0000 "application " | INHALATION_SPRAY | CUTANEOUS | Status: DC | PRN

## 2013-06-14 MED ORDER — NEPRO/CARBSTEADY PO LIQD: 237.0000 mL | ORAL | Status: DC | PRN

## 2013-06-14 MED ORDER — IPRATROPIUM-ALBUTEROL 20-100 MCG/ACT IN AERS: 1.0000 | INHALATION_SPRAY | Freq: Four times a day (QID) | RESPIRATORY_TRACT | Status: DC | PRN

## 2013-06-14 MED ORDER — ALTEPLASE 2 MG IJ SOLR: 2.0000 mg | Freq: Once | INTRAMUSCULAR | Status: DC | PRN

## 2013-06-14 MED ORDER — LIDOCAINE HCL (PF) 1 % IJ SOLN: 5.0000 mL | INTRAMUSCULAR | Status: DC | PRN

## 2013-06-14 MED ORDER — HEPARIN SODIUM (PORCINE) 1000 UNIT/ML IJ SOLN
6000.0000 [IU] | Freq: Once | INTRAMUSCULAR | Status: AC
  Administered 2013-06-14: 6000 [IU] via INTRAVENOUS

## 2013-06-14 NOTE — Progress Notes (Signed)
  Date: 06/14/2013  Patient name: Jeffrey [name]  Medical record number: [id number]  Date of birth: [birth year]   This patient has been seen and the plan of care was discussed with the house staff. Please see their note for complete details. I concur with their findings with the following additions/corrections: He liked the addition of the atrovent so can send home with combivent. Renal is planning another HD session today. Dispo will depend on renal's plans. [facility] consult - pt is interested. Will F/U with [facility].   Bartholomew Crews, MD 06/14/2013, 11:37 AM

## 2013-06-14 NOTE — Progress Notes (Signed)
Renal Service Daily Progress Note [facility]  Subjective: Breathing good, had a BM after laxative  Physical Exam:  Blood pressure 161/94, pulse 78, temperature 98.3 F (36.8 C), temperature source Oral, resp. rate 18, height 6' (1.829 m), weight 81.5 kg (179 lb 10.8 oz), SpO2 97.00%. Gen: alert,  Neck: no JVD, no LAN  Chest: crackles L base, R clear CV: regular, pedal pulses intact, no gallop or rub  Abdomen: soft, nontender, no mass or HSM  Ext: no LE edema Neuro: alert, Ox3, nonfocal  Access: AVF patent    Outpatient HD: ([facility])  3-4 hr treatments 5days/wk, usually M-W-Th-Sat-Sun Dry wt 88kg Heparin 6000 bolus  Hectorol none EPO 20,000 units with each HD Venofer 200 mg monthly, next dose 9/23   Assessment:  1. HTN crisis / pulm edema- due to lean body wt loss, much better from resp standpoint but BP still high; plan HD again today 2. ESRD, on home HD, started HD in 2012, HD 5x/wk 3. Anemia on EPO high dose tiw at home, Hb 9.9, gave low dose darbe 9/23 4. Metabolic bone disease- cont phoslo, sensipar 5. HTN/volume - vol xs, lowering dry wt, cont BP meds 6. HIV- on ART   Plan- HD today, UF 3-4 kg as tolerated  Kelly Splinter  MD Pager [phone]    Cell  [phone] 06/14/2013, 8:32 AM    Recent Labs Lab 06/12/13 0311 06/12/13 1025 06/13/13 0350  NA 137 132* 139  K 3.1* 3.0* 4.0  CL 93* 90* 99  CO2  --  30 31  GLUCOSE 91 119* 83  BUN 42* 45* 23  CREATININE 9.80* 10.67* 6.69*  CALCIUM  --  9.6 9.3  PHOS  --  3.0 2.3    Recent Labs Lab 06/12/13 1025 06/13/13 0350  AST 15  --   ALT 10  --   ALKPHOS 62  --   BILITOT 0.7  --   PROT 8.3  --   ALBUMIN 3.7  3.8 3.7    Recent Labs Lab 06/12/13 0304 06/12/13 0311  WBC 4.0  --   HGB 9.3* 9.9*  HCT 27.0* 29.0*  MCV 87.1  --   PLT 98*  --    . albuterol  2.5 mg Nebulization Q4H  . amLODipine  10 mg Oral BID  . calcitRIOL  0.25 mcg Oral BID  . calcium acetate  2,001 mg Oral  TID WC  . cinacalcet  30 mg Oral Q breakfast  . darbepoetin  25 mcg Intravenous Q Mon-HD  . heparin  5,000 Units Subcutaneous Q8H  . hydrALAZINE  25 mg Oral TID  . labetalol  200 mg Oral BID  . lamiVUDine  25 mg Oral Daily  . multivitamin  1 tablet Oral QHS  . raltegravir  400 mg Oral BID  . [START ON 06/17/2013] tenofovir  300 mg Oral Q Sat  . traZODone  50 mg Oral QHS     albuterol, ALPRAZolam, calcium acetate, ipratropium, sorbitol

## 2013-06-14 NOTE — Progress Notes (Addendum)
Subjective: Patient feels as though is breathing is back to normal today. He thinks the duoneb works better than the albuterol alone. Patient successfully had bowel movement overnight after he received metamucil, no abdominal discomfort today. Patient's weight continues to decrease with HD, today weight is 81.5kg (down from 83.9kg yesterday). BPs remain elevated on his home antihypertensive regimen.  Objective: Vital signs in last 24 hours: Filed Vitals:   06/14/13 0008 06/14/13 0036 06/14/13 0300 06/14/13 0500  BP:  153/89 161/94   Pulse:  67 78   Temp:  98.5 F (36.9 C) 98.3 F (36.8 C)   TempSrc:  Oral Oral   Resp:  19 18   Height:      Weight:    81.5 kg (179 lb 10.8 oz)  SpO2: 98% 99% 97%    Weight change: -0.9 kg (-1 lb 15.8 oz)  Intake/Output Summary (Last 24 hours) at 06/14/13 0649 Last data filed at 06/14/13 0100  Gross per 24 hour  Intake    240 ml  Output   4001 ml  Net  -3761 ml    Physical Exam General: lying flat in bed, he is alert, cooperative, and in no apparent distress HEENT: NCAT, vision grossly intact Neck: supple Lungs: minimal crackles at L base, but R base is clear; no wheezes present; normal WOB Heart: regular rate and rhythm, no murmurs, gallops, or rubs Abdomen: soft, non-tender, non-distended, normal bowel sounds  Extremities: no BLE edema, warm extremities with good perfusion Neurologic: alert & oriented X3, cranial nerves II-XII grossly intact, strength and sensation is grossly intact  Lab Results: Basic Metabolic Panel:  Recent Labs Lab 06/12/13 1025 06/13/13 0350  NA 132* 139  K 3.0* 4.0  CL 90* 99  CO2 30 31  GLUCOSE 119* 83  BUN 45* 23  CREATININE 10.67* 6.69*  CALCIUM 9.6 9.3  MG  --  1.9  PHOS 3.0 2.3   Liver Function Tests:  Recent Labs Lab 06/12/13 1025 06/13/13 0350  AST 15  --   ALT 10  --   ALKPHOS 62  --   BILITOT 0.7  --   PROT 8.3  --   ALBUMIN 3.7  3.8 3.7   CBC:  Recent Labs Lab 06/12/13 0304  06/12/13 0311  WBC 4.0  --   HGB 9.3* 9.9*  HCT 27.0* 29.0*  MCV 87.1  --   PLT 98*  --    Cardiac Enzymes:  Recent Labs Lab 06/12/13 1025  TROPONINI <0.30   Urine Drug Screen: Drugs of Abuse     Component Value Date/Time   LABOPIA NONE DETECTED 06/02/2011 0948   COCAINSCRNUR POSITIVE* 06/02/2011 0948   LABBENZ NONE DETECTED 06/02/2011 0948   AMPHETMU NONE DETECTED 06/02/2011 0948   THCU NONE DETECTED 06/02/2011 0948   LABBARB NONE DETECTED 06/02/2011 0948     Micro Results: Recent Results (from the past 240 hour(s))  MRSA PCR SCREENING     Status: None   Collection Time    06/12/13  9:29 AM      Result Value Range Status   MRSA by PCR NEGATIVE  NEGATIVE Final   Comment:            The GeneXpert MRSA Assay (FDA     approved for NASAL specimens     only), is one component of a     comprehensive MRSA colonization     surveillance program. It is not     intended to diagnose MRSA  infection nor to guide or     monitor treatment for     MRSA infections.   Studies/Results: No results found. Medications: I have reviewed the patient's current medications. Scheduled Meds: . albuterol  2.5 mg Nebulization Q4H  . amLODipine  10 mg Oral BID  . calcitRIOL  0.25 mcg Oral BID  . calcium acetate  2,001 mg Oral TID WC  . cinacalcet  30 mg Oral Q breakfast  . darbepoetin  25 mcg Intravenous Q Mon-HD  . heparin  5,000 Units Subcutaneous Q8H  . hydrALAZINE  25 mg Oral TID  . labetalol  200 mg Oral BID  . lamiVUDine  25 mg Oral Daily  . multivitamin  1 tablet Oral QHS  . raltegravir  400 mg Oral BID  . [START ON 06/17/2013] tenofovir  300 mg Oral Q Sat  . traZODone  50 mg Oral QHS   Continuous Infusions:  PRN Meds:.albuterol, ALPRAZolam, calcium acetate, ipratropium, sorbitol  Assessment/Plan:  #Fluid overload due to ESRD (on home HD)- Patient received HD both yesterday and day prior. Per nephrology, he will receive HD again today. Breathing is much improved today, though  still with L side basilar crackles. Cr was not checked overnight, but had decreased from 9.8 on admission to 6.69 after first HD session. Patient had followed with Dr Justin Mend previously. I spoke with [facility] and they are working on switching his nephrologist per patient's request and they will call him next week for a follow up appointment. -daily weights (today weight is 81.5kg, which is down from yesterday WT of 83.9kg, his reported dry WT is 88.5 kg) -Is and Os  -renal on board, HD done past two days and will have another session today  -renal diet  -[facility] consult -possible discharge today if stable  -transfer to med surg floor  #Hypertensive urgency- BP 218/113 on presentation, started on nitroglycerine gtt in ED, which patient's blood pressure responded to and went down to 176/84 at which point NTG drip was discontinued and patient's home medications were restarted. BPs 153-161/89-94 overnight. May need to uptitrate his hydralazine to 50mg  TID if his BP does not start responding to the HD. -telemetry  -continue home medications: labetelol 200 mg BID, amlodipine 10 mg BID, hydralazine 25 mg TID -renal to make BP recommendations   #Asthma- Patient's SOB on admission was likely multifactorial with both volume overload and asthma contributing. Today patient's breathing is at baseline, though still with some cough. Patient thinks the duonebs work better than the albuterol neb, so will plan to discharge patient with Combivent inhaler to use at home. -monitor pulse ox  -PFTs- as outpatient outpatient -duonebs q4h prn  #Hypokalemia- K 3.1 on admission and after HD on first day of admission, K rose to 4.0.   # HIV, controlled and HBV- Pt follows with Dr. Johnnye Sima. Most recent CD4 210, viral load <20 on 04/24/13. Reports good medication compliance with ART.  -continue home meds raltegravir 400 mg BID, lamivudine 25 mg daily, tenofavir 300 mg qSat   # Normocytic anemia and thrombocytopenia,  chronic- Stable. Hgb 9.9 (baseline 10-12), platelets 98 (86 at last admission), likely related to chronic disease. Pt takes Epogen at home.   #Tobacco abuse and hx of cocaine use- 1 pack lasts months  -smoking cessation counseling   #DVT PPX- heparin (given ESRD)  #Code status- Full code   Dispo: Discharge possibly today after HD if renal agrees.  The patient does have a current PCP Campbell Riches, MD)  and does not need an [facility] hospital follow-up appointment after discharge.  The patient does not have transportation limitations that hinder transportation to clinic appointments.  .Services Needed at time of discharge: Y = Yes, Blank = No PT:   OT:   RN:   Equipment:   Other:     LOS: 2 days   Rebecca Eaton, MD 06/14/2013, 6:49 AM   ADDENDUM I spoke with Dr. Jonnie Finner and patient will not be able to receive HD today 2/2 logistical complications. Patient is safe to return home today. Dr. Jonnie Finner will provide patient with instructions on how to perform home HD and safely continue to take off fluid. Dr. Jonnie Finner will provide his cell phone number to the patient in case the patient has questions. Dr. Jonnie Finner and I discussed increasing patient's hydralazine to 50mg  TID as patient's blood pressures have been elevated despite two sessions of HD.   Dixon Boos Internal Medicine, PGY1 Pager [phone] 06/14/2013, 1:42 PM   ADDENDUM I spoke with HD nurse. Patient now able to receive HD today. Plan for HD, then discharge to home.  Dixon Boos Internal Medicine, PGY1 Pager [phone] 06/14/2013, 1:45 PM

## 2013-06-14 NOTE — Discharge Summary (Signed)
Name: Jeffrey [name] MRN: [id number] DOB: Dec 06, 1955 57 y.o. PCP: Campbell Riches, MD  Date of Admission: 06/12/2013  2:14 AM Date of Discharge: 06/14/2013 Attending Physician: Dr. Larey Dresser  Discharge Diagnosis:  Principal Problem:   Hypertensive urgency   End stage renal disease on home HD Active Problems:   Thrombocytopenia   Tobacco abuse   Fluid overload 2/2 ESRD  Discharge Medications:   Medication List    STOP taking these medications       albuterol 108 (90 BASE) MCG/ACT inhaler  Commonly known as:  PROVENTIL HFA;VENTOLIN HFA      TAKE these medications       ALPRAZolam 0.5 MG tablet  Commonly known as:  XANAX  Take 1 tablet (0.5 mg total) by mouth 2 (two) times daily as needed for sleep.     amLODipine 10 MG tablet  Commonly known as:  NORVASC  Take 10 mg by mouth 2 (two) times daily.     calcitRIOL 0.25 MCG capsule  Commonly known as:  ROCALTROL  Take 0.25 mcg by mouth 2 (two) times daily.     Calcium Acetate 667 MG Tabs  Take 1,334-2,001 mg by mouth 3 (three) times daily with meals. Takes 3 capsules with meals and 2 capsules with snacks     cinacalcet 30 MG tablet  Commonly known as:  SENSIPAR  Take 30 mg by mouth daily.     hydrALAZINE 25 MG tablet  Commonly known as:  APRESOLINE  Take 2 tablets (50 mg total) by mouth 3 (three) times daily.     Ipratropium-Albuterol 20-100 MCG/ACT Aers respimat  Commonly known as:  COMBIVENT RESPIMAT  Inhale 1 puff into the lungs every 6 (six) hours as needed for wheezing.     labetalol 200 MG tablet  Commonly known as:  NORMODYNE  Take 200 mg by mouth 2 (two) times daily.     lamivudine 5 MG/ML solution  Commonly known as:  EPIVIR-HBV  Take 5 mLs (25 mg total) by mouth daily.     multivitamin Tabs tablet  Take 1 tablet by mouth daily.     raltegravir 400 MG tablet  Commonly known as:  ISENTRESS  Take 1 tablet (400 mg total) by mouth 2 (two) times daily.     tenofovir 300 MG tablet    Commonly known as:  VIREAD  Take 300 mg by mouth every 7 (seven) days. On Saturdays     traZODone 50 MG tablet  Commonly known as:  DESYREL  Take 1 tablet (50 mg total) by mouth at bedtime.       Disposition and follow-up:   Jeffrey [name] was discharged from [facility] in Stable condition.  At the hospital follow up visit please address:  1.  Asthma- Patient will need outpatient PFTs done. I ordered these at discharge. Please make sure he completes this test as we have no documentation of asthma and we suspect there may be an element of COPD. Patient thought the duoneb worked better to improve his breathing than the albuterol neb alone, therefore I sent him home with Combivent inhaler.  2.  HTN- Patient presented with hypertensive urgency to 200s/100s. His BP was stable, though elevated for most of his hospitalization (160s/80s). We expected his BP to improve more with aggressive HD, though it did not respond as much as we expected; therefore, his home dose of hydralazine was increased to 50mg  TID at discharge. 3.  ESRD on home HD- Patient  will need close follow up with renal. Please make sure this is occurring. Additionally, patient saw [facility] representative- please see if patient has followed up with their services.  2.  Labs / imaging needed at time of follow-up: PFTs  3.  Pending labs/ test needing follow-up: none  Follow-up Appointments: Follow-up Information   Follow up with Rebecca Eaton, MD On 08/16/2013. (3:30PM)    Specialty:  Internal Medicine   Contact information:   [place] [address] [phone]       Follow up with [facility]. Call in 2 weeks. (They will call you for an appointment. If they do not call by early next week, please call their office to schedule an appointment.)    Contact information:   [place] [address] [phone]     Discharge Instructions: Discharge Orders    Future Appointments Provider Department Dept Phone   08/16/2013 3:30 PM Rebecca Eaton, MD [place] [phone]   10/24/2013 9:30 AM [facility] [phone]   11/08/2013 10:00 AM Campbell Riches, MD [facility] [phone]   Future Orders Complete By Expires   Pulmonary function test  As directed 06/14/2014   Questions:     Basic spirometry:     Spirometry pre & post bronchodilator:  Yes   Diffusion capacity (DLCO):     Lung volumes:     MIP/MEP:     ABG:     Full PFT:  Yes   Methacholine challenge:     Exercise induced bronchospasm study (EIB):     Portable before and after:     Portable simple:     Where should this test be performed?:  [facility]     Consultations:   Sol Blazing, MD- nephrology  Procedures Performed:  Dg Chest 2 View  06/12/2013   CLINICAL DATA:  Shortness of breath in cough for 2 days  EXAM: CHEST  2 VIEW  COMPARISON:  Chest x-ray from earlier the same day  FINDINGS: Cardiomegaly stable from before. Symmetric bilateral interstitial opacities. There is linear scarring or accessory fissure at the right base, stable from prior. No effusion or pneumothorax.  Left glenohumeral osteoarthritis with sclerotic humeral head.  IMPRESSION: Bronchitic changes which could represent developing CHF or atypical infection.   Electronically Signed   By: Jorje Guild   On: 06/12/2013 06:17   Dg Chest Portable 1 View  06/12/2013   CLINICAL DATA:  Hypertension.  EXAM: PORTABLE CHEST - 1 VIEW  COMPARISON:  PA and lateral chest 01/09/2013.  FINDINGS: There is cardiomegaly and vascular congestion without frank edema. No consolidative process, pneumothorax or effusion is identified.  IMPRESSION: Cardiomegaly and pulmonary vascular congestion. No acute process.   Electronically Signed   By: Inge Rise M.D.   On: 06/12/2013 03:11   Admission HPI:  Jeffrey [name] is  a 57 year old man with history of ESRD (on home HD every other day via LUE buttonhole fistula), HIV (last CD 210, viral load <20 on 04/25/13, follows with Dr. Johnnye Sima), HBV, HTN, anemia/thrombocytopenia who presents with shortness of breath and nonproductive cough x 1 week, no lower extremity edema. Patient states that he is supposed to perform HD 5 days/week although he only does every other day because it "wears him out." He weighs himself before each session and takes off fluid accordingly. His reported dry weight is 88.5 kg; last HD  session on 9/21, he weighed 87.9kg so took off 1 L. However, he thinks his scale and BP cuff may be broken. He does continue to make some urine, about half a specimen cup full, 2-3x daily. Patient is concerned because to for the last 3 months, he has noticed his BP has remained high even after dialysis. He recently mentioned this to his nephrologist Dr. Justin Mend who increased labetolol dose to 200 mg PO BID. In the past week, patient has been feeling short of breath with some associated wheezing. He got a nebulizer treatment in the ED and did not feel short of breath at rest after.   He reports good PO intake and did take his BP meds last night (amlodipine at dinnertime, labetalol at 11p). His BP was quite elevated at presentation (218/113) so he was started on a nitroglycerine gtt and BP trended down to [id number] systolic.   Denies fever/chills, chest pain, palpitations, abdominal pain, change in bowel or bladder habits, lightheadedness, numbness/tingling.  Hospital Course by problem list:  #Fluid overload due to ESRD (on home HD)-  Patient presented with SOB and CXR showing possible developing fluid overload vs atypical infection. Given patient was afebrile without leukocytosis or tachycardia, did not favor infection and his symptoms thought to be primarily due to fluid overload 2/2 ESRD. Pt follows with Dr. Justin Mend (though pt requesting new nephrologist). On home HD every other day via  buttonhole fistula in left lower arm (though he is supposed to do 5 days/week). Likely not taking enough fluid off as he weighs himself and thinks his scale is broken. Cr 9.8 on admission, unclear baseline, though trended down to 6.69 after first HD session. Patient had bibasilar crackles w/ diffuse wheezing on initial presentation, though this improved significantly with two sessions of HD. Renal was consulted and thought that patient's dry weight which he was basing his home HD off of was too high and that his body weight is lower than previously thought. Therefore, renal worked with patient and suspect that his new dry weight is approximately 81kg (dry weight reportedly 88.5kg upon admission), though patient will need to follow up with renal to make sure this is accurate and to adjust his home HD settings as needed. Patient's breathing improved with combination of HD and Duonebs and at time of discharge he was feeling back to his baseline. Patient received a third session of HD on morning of discharge. [facility] was consulted and patient was amenable to using their services to assist with home dialysis. Patient is in the process of switching nephrologists. I spoke with [facility] prior to discharge and they will call patient this week with a follow up appointment once his care has been transitioned to another physician within their practice.   #Hypertensive urgency- Patient reports 3 month history of elevated BPs at home. He reports that his nephrologist was notified about this and in August 2014 switched his home coreg to labetalol. BP 218/113 on presentation, started on nitroglycerine gtt in ED, which patient's blood pressure responded to and went down to 176/84 at which point NTG drip was discontinued and patient's home medications were restarted (Labetelol 200 mg BID, amlodipine 10 mg BID, hydralazine 25 mg TID). Troponin negative x 1 and EKG without ischemic changes. BPs remained stable, though moderately  elevated (approx 160-180/80-90s) despite HD sessions. Therefore, at discharge patient was restarted on his home antihypertensives, with an increase in his hydralazine dose from 25mg  TID to 50mg  TID. Patient will need his BP monitored  as outpatient. Renal is aware and agree with our change in hydralazine dosing. Patient was monitored in the stepdown unit on telemetry without any acute cardiac events on tele during his stay.  #Asthma- Patient's SOB upon admission was likely somewhat multifactorial with both volume overload and asthma contributing. He had both wheezing and bibasilar crackles on exam. Crackles improved with the HD treatments as per above and the wheezing improved with albuterol nebs. This tx was changed to Duonebs, which completely resolved patient's wheezing. Patient reports that he uses his albuterol inhaler infrequently at home, going through one inhaler in a 6 month time period, so his asthma is seemingly well controlled. Given he responded so well to the duonebs, pt was started on a combivent inhaler at discharge. Though patient states he only lights a cigarette from time to time but doesn't smoke, I this patient would benefit from outpatient PFTs as he has none documented and this may elucidate whether or not patient has an element of COPD. Therefore, I ordered outpatient PFTs prior to patient's discharge which he will need done at some point.   #Hypokalemia- K 3.1 on admission. This resolved with HD.  # HIV, controlled and HBV- Pt follows with Dr. Johnnye Sima. Most recent CD4 210, viral load <20 on 04/24/13. Reports good medication compliance with ART. Continued home meds raltegravir 400 mg BID, lamivudine 25 mg daily, tenofavir 300 mg qSat.  # Normocytic anemia and thrombocytopenia, chronic- Stable. Hgb 9.9 (baseline 10-12), platelets 98 (86 at last admission), likely related to chronic disease. Pt takes Epogen at home.   #Tobacco abuse and hx of cocaine use- 1 pack lasts months. Reports he  lights the cigarettes, but doesn't smoke. Unclear what his smoking history is exactly. We had ordered a UDS given he has previously been positive for cocaine, but the sample was never collected.   #DVT PPX- Pt managed on heparin given ESRD.  Discharge Vitals:   BP 161/93  Pulse 69  Temp(Src) 97.9 F (36.6 C) (Oral)  Resp 21  Ht 6' (1.829 m)  Wt 83.7 kg (184 lb 8.4 oz)  BMI 25.02 kg/m2  SpO2 99%  Discharge Labs:  No results found for this or any previous visit (from the past 24 hour(s)).  Signed: Rebecca Eaton, MD 06/14/2013, 2:27 PM   Time Spent on Discharge: 35 minutes Services Ordered on Discharge: none Equipment Ordered on Discharge: none

## 2013-06-14 NOTE — Progress Notes (Signed)
06/14/2013 patient came from 2600, to dialysis then to [facility]. Patient is alert, oriented and ambulatory. It was reported to RN patient was d/c after hemodialysis. Discharge instruction was given to patient and brother. Patient blood pressure was 160/100 and he was given is schedule hydralazine 25 mg. Patient skin was fine, just some dryness. Patient stated sacrum had no redness or sores. [facility] RN.

## 2013-06-15 DIAGNOSIS — N186 End stage renal disease: Secondary | ICD-10-CM | POA: Diagnosis not present

## 2013-06-16 ENCOUNTER — Emergency Department (HOSPITAL_COMMUNITY)
Admission: EM | Admit: 2013-06-16 | Discharge: 2013-06-17 | Disposition: A | Discharge status: Home / Self Care | Payer: Medicare Other | Attending: Emergency Medicine | Admitting: Emergency Medicine

## 2013-06-16 ENCOUNTER — Encounter (HOSPITAL_COMMUNITY): Payer: Self-pay | Admitting: Emergency Medicine

## 2013-06-16 DIAGNOSIS — N186 End stage renal disease: Secondary | ICD-10-CM | POA: Diagnosis not present

## 2013-06-16 DIAGNOSIS — Z8619 Personal history of other infectious and parasitic diseases: Secondary | ICD-10-CM | POA: Diagnosis not present

## 2013-06-16 DIAGNOSIS — J45909 Unspecified asthma, uncomplicated: Secondary | ICD-10-CM | POA: Diagnosis not present

## 2013-06-16 DIAGNOSIS — Z880 Allergy status to penicillin: Secondary | ICD-10-CM | POA: Insufficient documentation

## 2013-06-16 DIAGNOSIS — Z862 Personal history of diseases of the blood and blood-forming organs and certain disorders involving the immune mechanism: Secondary | ICD-10-CM | POA: Insufficient documentation

## 2013-06-16 DIAGNOSIS — I120 Hypertensive chronic kidney disease with stage 5 chronic kidney disease or end stage renal disease: Secondary | ICD-10-CM | POA: Insufficient documentation

## 2013-06-16 DIAGNOSIS — Z79899 Other long term (current) drug therapy: Secondary | ICD-10-CM | POA: Insufficient documentation

## 2013-06-16 DIAGNOSIS — I953 Hypotension of hemodialysis: Secondary | ICD-10-CM | POA: Diagnosis not present

## 2013-06-16 DIAGNOSIS — Z21 Asymptomatic human immunodeficiency virus [HIV] infection status: Secondary | ICD-10-CM | POA: Insufficient documentation

## 2013-06-16 DIAGNOSIS — I959 Hypotension, unspecified: Secondary | ICD-10-CM | POA: Diagnosis not present

## 2013-06-16 DIAGNOSIS — F411 Generalized anxiety disorder: Secondary | ICD-10-CM | POA: Diagnosis not present

## 2013-06-16 DIAGNOSIS — Z992 Dependence on renal dialysis: Secondary | ICD-10-CM | POA: Diagnosis not present

## 2013-06-16 DIAGNOSIS — F17200 Nicotine dependence, unspecified, uncomplicated: Secondary | ICD-10-CM | POA: Insufficient documentation

## 2013-06-16 DIAGNOSIS — R6889 Other general symptoms and signs: Secondary | ICD-10-CM | POA: Diagnosis not present

## 2013-06-16 DIAGNOSIS — Z8639 Personal history of other endocrine, nutritional and metabolic disease: Secondary | ICD-10-CM | POA: Insufficient documentation

## 2013-06-16 NOTE — ED Notes (Signed)
Report from [facility]> Pt reports low blood pressure during home dialysis. Brother reported to [facility] BP was 66/40 and pt lethargic.  On fire dept arrival, BP 70/50.  EMS placed pt in trendelenburg and gave 300cc NS.  BP per EMS 128/86 and 134/84, CBG 107, HR 60, R 14.  Pt reports feeling "a lot better."  Denies pain.  Pt alert and oriented.  Recently seen in ED for hypertension.

## 2013-06-16 NOTE — ED Provider Notes (Signed)
CSN: [id number]     Arrival date & time 06/16/13  2307 History   First MD Initiated Contact with Patient 06/16/13 2307     Chief Complaint  Patient presents with  . Hypotension   (Consider location/radiation/quality/duration/timing/severity/associated sxs/prior Treatment) The history is provided by the patient.  Jeffrey [name] is a 57 y.o. male history of HTN, ESRD on HD (last HD was today at home) here with hypotension. He was recently admitted for hypertensive urgency and had about 8 kg taken off this week. Today was getting home dialysis and was trying to take off another 4 kg when he became unresponsive. His BP at home was  66/40. EMS had bp of 70/50 and gave him 300 cc bolus and BP increased to 134/84. He is alert now and feels well. Denies chest pain or shortness of breath or fever or abdominal pain.    Past Medical History  Diagnosis Date  . Hypertension   . Gout   . Anemia   . Seizures 06/02/2011  . Thrombocytopenia   . Anxiety   . Hepatitis B carrier   . HIV infection   . Seizure   . Asthma     per pt hx  . End stage renal disease on home HD 07/10/2011    Started HD in September 2012 at [facility] with a tunneled HD catheter, now on home HD with NxtStage. Dialyzing through AVF L lower arm with buttonhole technique as of mid 2014. His brother does the HD treatments at home.  They are roommates for 23 years.  The brother works 3rd shift and gets off about 8am and then puts Mr Cacy on HD in the morning after getting home. Most of the time he does HD about 4 times a week, for about 4 hours per treatment. Cause of ESRD was HTN according to patient. He says he let his health go and ending up with complications, and that he didn't like seeing doctors in those days.  He says he was diagnosed with severe HTN when he lived in [place] in his 24's.    Past Surgical History  Procedure Laterality Date  . Av fistula placement  06/02/11    Left radiocephalic AVF  . Other surgical history     removal temporary HD catheter    Family History  Problem Relation Age of Onset  . Hypertension Mother   . Cancer Father    History  Substance Use Topics  . Smoking status: Current Some Day Smoker    Types: Cigarettes  . Smokeless tobacco: Never Used     Comment: not ready to quit yet  . Alcohol Use: No    Review of Systems  Unable to perform ROS: Mental status change  All other systems reviewed and are negative.    Allergies  Lisinopril and Penicillins  Home Medications   Current Outpatient Rx  Name  Route  Sig  Dispense  Refill  . ALPRAZolam (XANAX) 0.5 MG tablet   Oral   Take 1 tablet (0.5 mg total) by mouth 2 (two) times daily as needed for sleep.   60 tablet   2   . amLODipine (NORVASC) 10 MG tablet   Oral   Take 10 mg by mouth 2 (two) times daily.         . calcitRIOL (ROCALTROL) 0.25 MCG capsule   Oral   Take 0.25 mcg by mouth 2 (two) times daily.         . Calcium Acetate 667  MG TABS   Oral   Take 1,334-2,001 mg by mouth 3 (three) times daily with meals. Takes 3 capsules with meals and 2 capsules with snacks         . cinacalcet (SENSIPAR) 30 MG tablet   Oral   Take 30 mg by mouth daily.         . hydrALAZINE (APRESOLINE) 25 MG tablet   Oral   Take 2 tablets (50 mg total) by mouth 3 (three) times daily.   90 tablet   0   . Ipratropium-Albuterol (COMBIVENT RESPIMAT) 20-100 MCG/ACT AERS respimat   Inhalation   Inhale 1 puff into the lungs every 6 (six) hours as needed for wheezing.   1 Inhaler   2   . labetalol (NORMODYNE) 200 MG tablet   Oral   Take 200 mg by mouth 2 (two) times daily.          Marland Kitchen lamivudine (EPIVIR-HBV) 5 MG/ML solution   Oral   Take 5 mLs (25 mg total) by mouth daily.   240 mL   3   . multivitamin (RENA-VIT) TABS tablet   Oral   Take 1 tablet by mouth daily.           . raltegravir (ISENTRESS) 400 MG tablet   Oral   Take 1 tablet (400 mg total) by mouth 2 (two) times daily.   60 tablet   11   .  tenofovir (VIREAD) 300 MG tablet   Oral   Take 300 mg by mouth every 7 (seven) days. On Saturdays         . traZODone (DESYREL) 50 MG tablet   Oral   Take 1 tablet (50 mg total) by mouth at bedtime.   30 tablet   1    BP 146/83  Pulse 59  Temp(Src) 98.3 F (36.8 C) (Oral)  Resp 16  SpO2 100% Physical Exam  Nursing note and vitals reviewed. Constitutional: He is oriented to person, place, and time. He appears well-developed and well-nourished.  Well appearing, alert   HENT:  Head: Normocephalic.  Mouth/Throat: Oropharynx is clear and moist.  Eyes: Conjunctivae are normal. Pupils are equal, round, and reactive to light.  Neck: Normal range of motion. Neck supple.  Cardiovascular: Normal rate, regular rhythm and normal heart sounds.   Pulmonary/Chest: Effort normal and breath sounds normal. No respiratory distress. He has no wheezes. He has no rales.  Abdominal: Soft. Bowel sounds are normal. He exhibits no distension. There is no tenderness. There is no rebound and no guarding.  Musculoskeletal: Normal range of motion.  Neurological: He is alert and oriented to person, place, and time.  Nl strength and sensation throughout   Skin: Skin is warm and dry.  L arm shunt not erythematous and not bleeding   Psychiatric: He has a normal mood and affect. His behavior is normal. Judgment and thought content normal.    ED Course  Procedures (including critical care time) Labs Review Labs Reviewed  CBC WITH DIFFERENTIAL - Abnormal; Notable for the following:    RBC 3.57 (*)    Hemoglobin 10.8 (*)    HCT 32.3 (*)    RDW 16.5 (*)    Eosinophils Relative 6 (*)    All other components within normal limits  BASIC METABOLIC PANEL - Abnormal; Notable for the following:    Chloride 94 (*)    Glucose, Bld 129 (*)    BUN 28 (*)    Creatinine, Ser 5.98 (*)  GFR calc non Af Amer 9 (*)    GFR calc Af Amer 11 (*)    All other components within normal limits  TROPONIN I   Imaging  Review No results found.   Date: 06/16/2013  Rate: 59  Rhythm: normal sinus rhythm  QRS Axis: normal  Intervals: normal  ST/T Wave abnormalities: early repolarization  Conduction Disutrbances:none  Narrative Interpretation:   Old EKG Reviewed: unchanged    MDM  No diagnosis found. Jeffrey [name] is a 57 y.o. male here with hypotension that resolved. Likely due to too much fluid being taken off and recent med adjustment.   12:48 AM Labs at baseline. Never hypotensive here. I told him to dec hydralazine to 25 mg TID and talk to his nephrologist regarding dialysis regimen.    Wandra Arthurs, MD 06/17/13 [phone]

## 2013-06-17 LAB — BASIC METABOLIC PANEL
BUN: 28 mg/dL — ABNORMAL HIGH (ref 6–23)
CO2: 27 mEq/L (ref 19–32)
Calcium: 9.9 mg/dL (ref 8.4–10.5)
Chloride: 94 mEq/L — ABNORMAL LOW (ref 96–112)
Creatinine, Ser: 5.98 mg/dL — ABNORMAL HIGH (ref 0.50–1.35)
GFR calc Af Amer: 11 mL/min — ABNORMAL LOW (ref >90)
GFR calc non Af Amer: 9 mL/min — ABNORMAL LOW (ref >90)
Glucose, Bld: 129 mg/dL — ABNORMAL HIGH (ref 70–99)
Potassium: 3.5 mEq/L (ref 3.5–5.1)
Sodium: 136 mEq/L (ref 135–145)

## 2013-06-17 LAB — TROPONIN I: Troponin I: <0.3 ng/mL (ref <0.30)

## 2013-06-17 LAB — CBC WITH DIFFERENTIAL/PLATELET
Basophils Absolute: 0 10*3/uL (ref 0.0–0.1)
Basophils Relative: 0 % (ref 0–1)
Hemoglobin: 10.8 g/dL — ABNORMAL LOW (ref 13.0–17.0)
Lymphs Abs: 1.1 10*3/uL (ref 0.7–4.0)
MCHC: 33.4 g/dL (ref 30.0–36.0)
Monocytes Relative: 9 % (ref 3–12)
Neutro Abs: 4.7 10*3/uL (ref 1.7–7.7)
Neutrophils Relative %: 69 % (ref 43–77)
RBC: 3.57 MIL/uL — ABNORMAL LOW (ref 4.22–5.81)
RDW: 16.5 % — ABNORMAL HIGH (ref 11.5–15.5)
WBC: 6.8 10*3/uL (ref 4.0–10.5)

## 2013-06-17 NOTE — ED Notes (Signed)
This RN went over discharge instructions and pts verbalized understanding, left without signing.

## 2013-06-18 IMAGING — XA IR SHUNTOGRAM/ FISTULAGRAM
1 series · 13 of 23 positions shown · non-contrast
Comparison: None

CLINICAL DATA: Decreased blood flow rate and return of clots
during hemodialysis with use of left forearm Paulus fistula.  This
was originally placed on 06/02/2011.

DIALYSIS AV FISTULOGRAM

[Series 1: run · 13 of 23 slices shown]
[im 1/23]
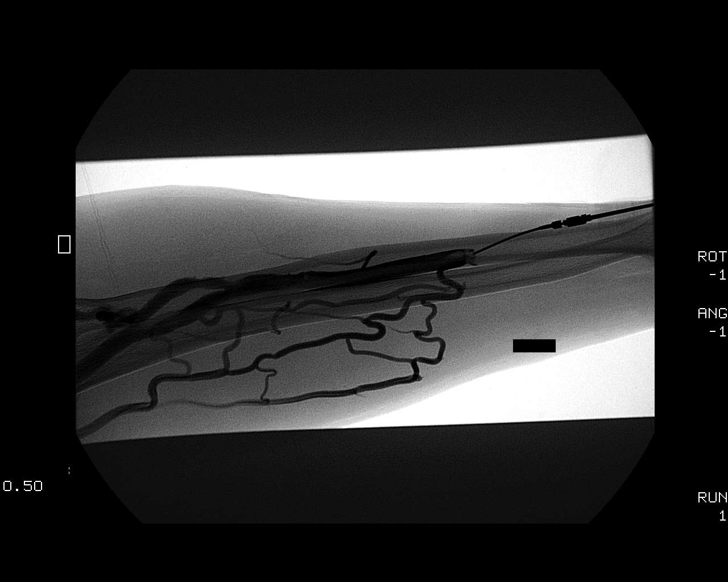
[im 3/23]
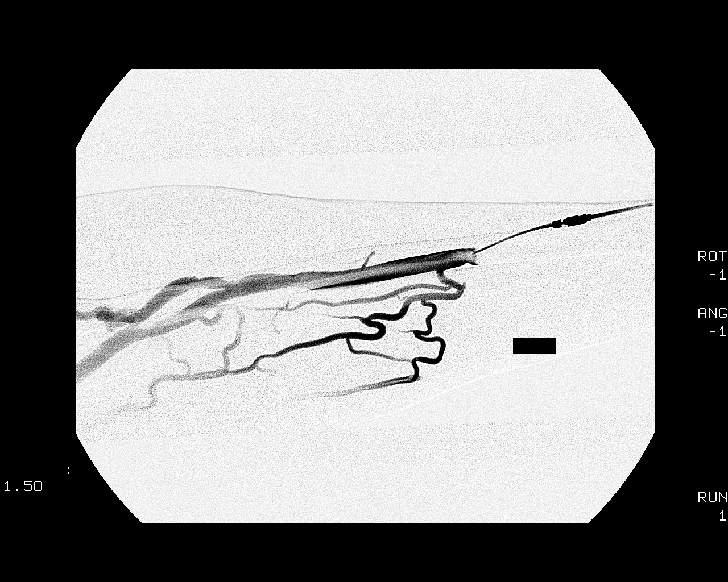
[im 5/23]
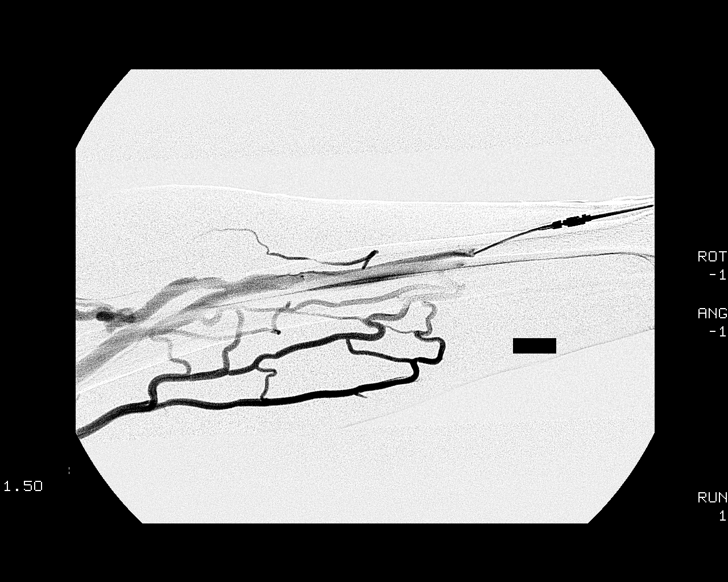
[im 7/23]
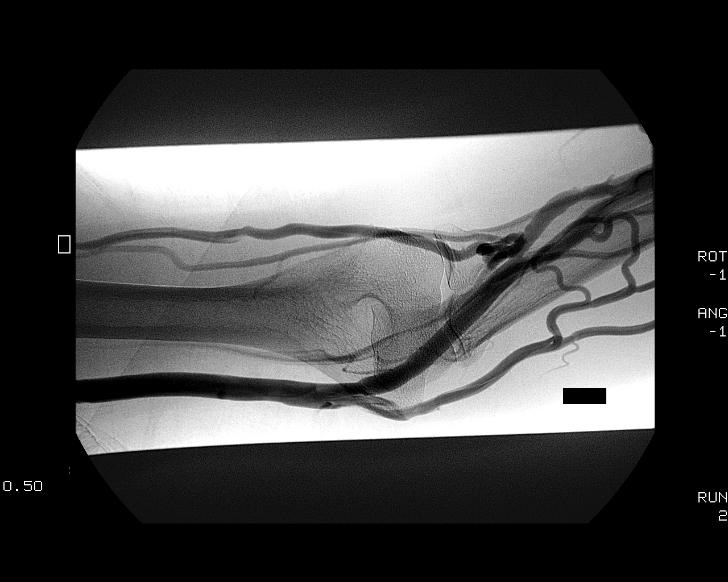
[im 8/23]
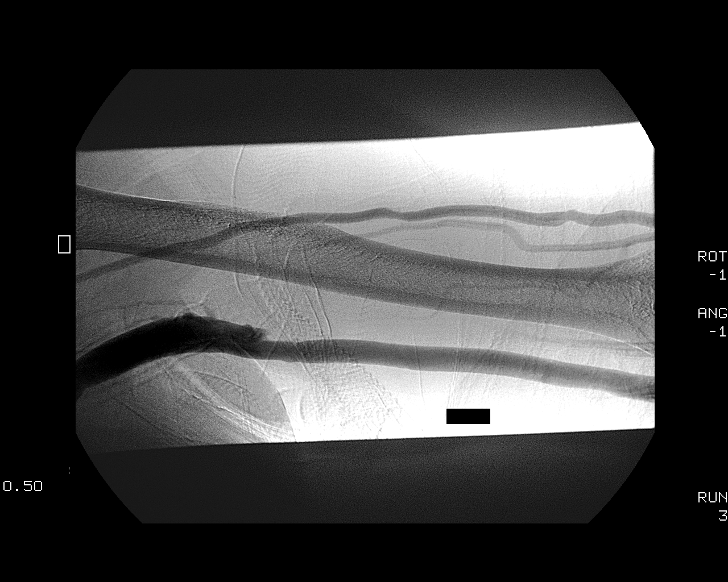
[im 10/23]
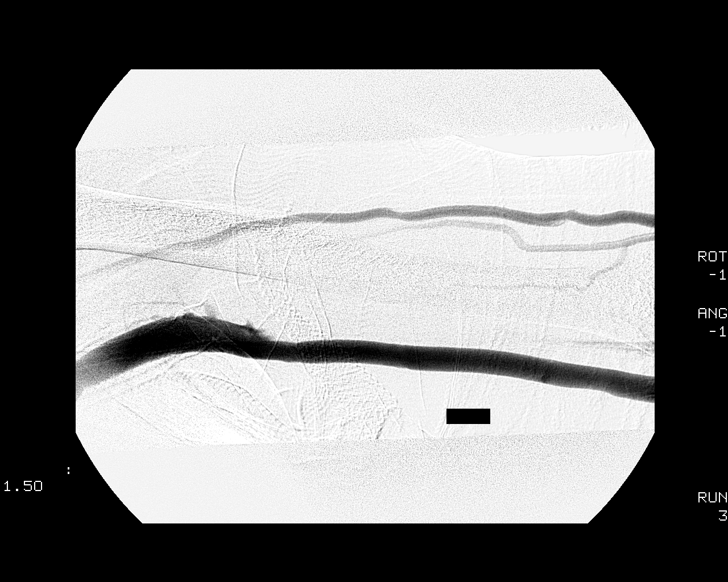
[im 12/23]
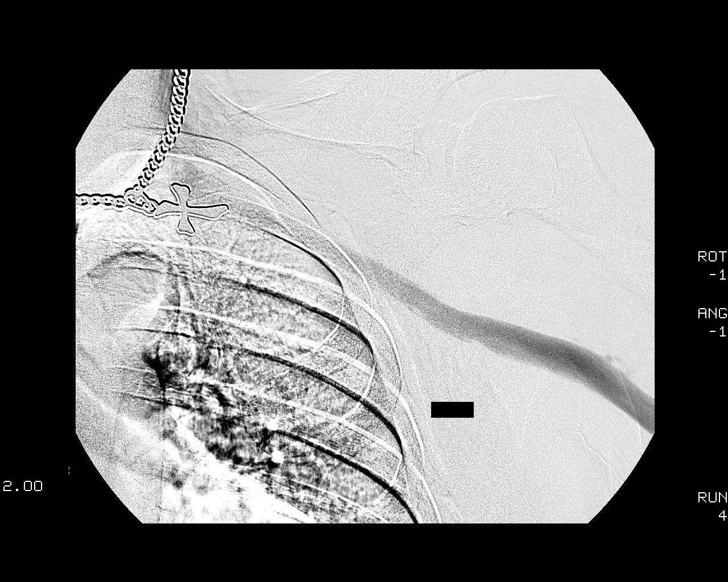
[im 14/23]
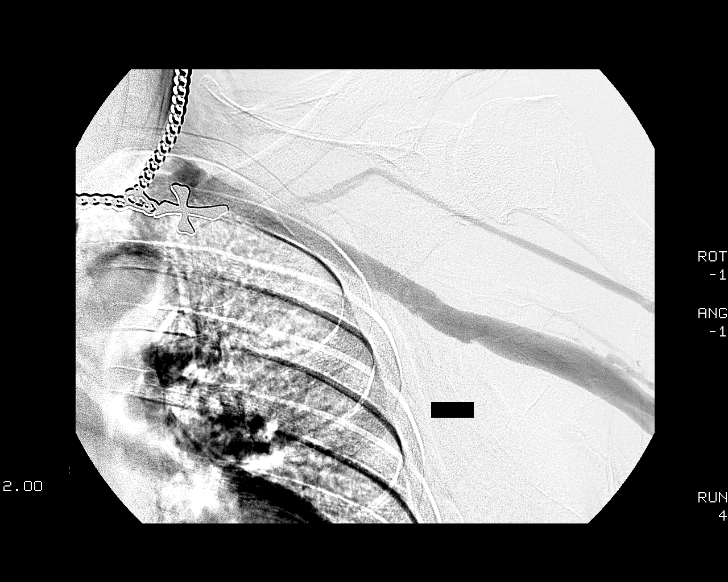
[im 16/23]
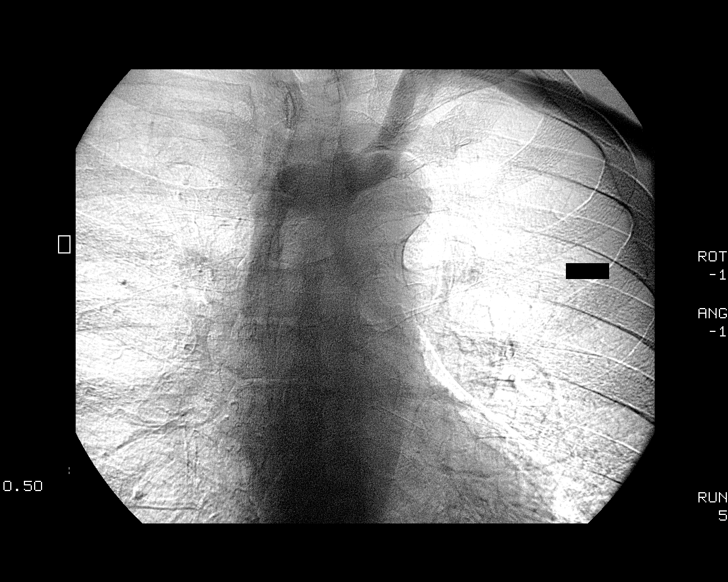
[im 17/23]
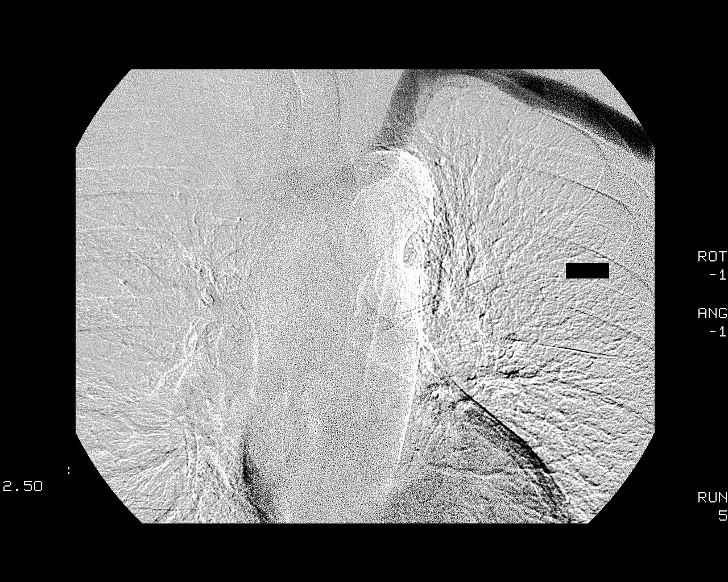
[im 19/23]
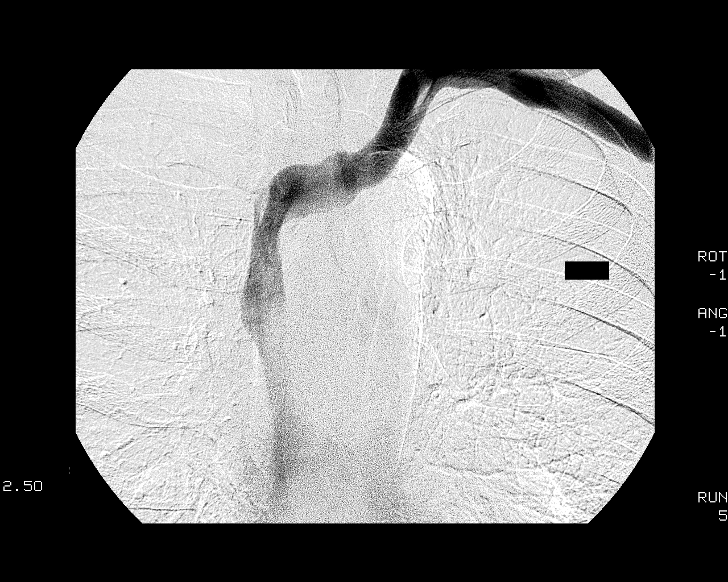
[im 21/23]
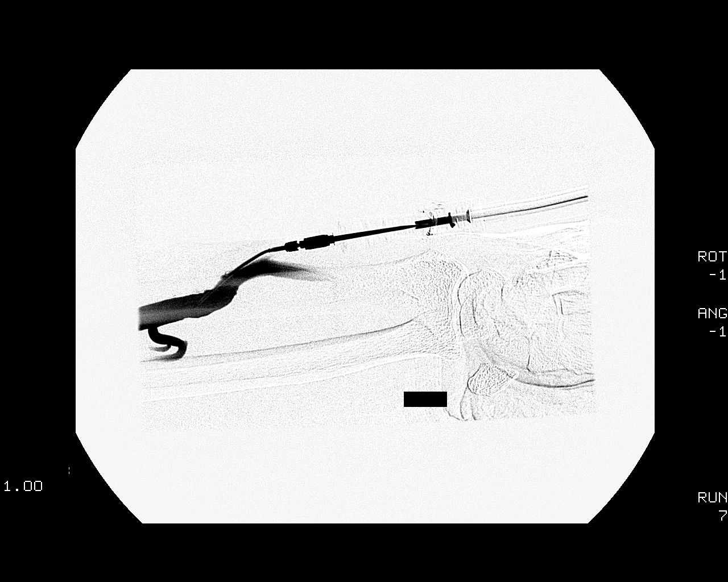
[im 23/23]
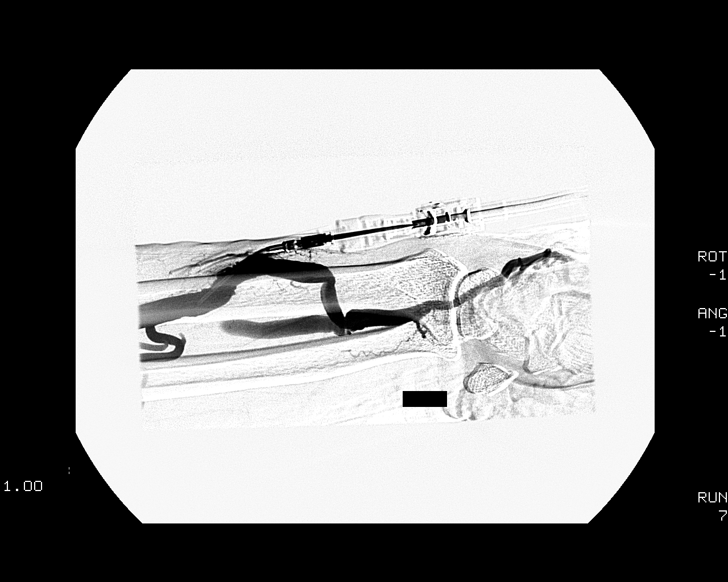

[13 of 23 positions shown; findings below may reference images not displayed]

Contrast:  72 ml Nmnipaque-HYY

Fluoroscopy Time: 0.9 minutes.

Procedure:  The procedure, risks, benefits, and alternatives were
explained to the patient.  Questions regarding the procedure were
encouraged and answered.  The patient understands and consents to
the procedure.

The left arm native fistula was prepped with Betadine in a sterile
fashion, and a sterile drape was applied covering the operative
field.  A diagnostic fistulogram was performed via an 18 gauge
angiocatheter introduced into venous outflow.  Venous drainage was
assessed to the level of the central veins in the chest.  Proximal
fistula was studied by reflux maneuver with temporary compression
of venous outflow.  After the procedure, the angiocatheter was
removed and hemostasis obtained with manual compression.

Complications: None
FINDINGS: The anastomosis between the radial artery and cephalic
vein is normally patent.  Initial segment of the cephalic vein is
mildly dilated.  There is relative poor maturity of the fistula due
to numerous patent branch vessels which primarily drain via the
deep venous system.  Beginning at the antecubital fossa, the
cephalic vein is very small in caliber, but open.  Dominant venous
drainage in the upper arm is via the deep venous system.  Central
veins are normally patent.
IMPRESSION: Relatively poorly matured radiocephalic fistula due to significant
competing venous outflow into the deep venous system.

## 2013-06-20 ENCOUNTER — Other Ambulatory Visit: Payer: Self-pay | Admitting: Internal Medicine

## 2013-06-21 ENCOUNTER — Telehealth: Payer: Self-pay | Admitting: *Deleted

## 2013-06-21 DIAGNOSIS — D509 Iron deficiency anemia, unspecified: Secondary | ICD-10-CM | POA: Diagnosis not present

## 2013-06-21 DIAGNOSIS — N186 End stage renal disease: Secondary | ICD-10-CM | POA: Diagnosis not present

## 2013-06-21 DIAGNOSIS — D631 Anemia in chronic kidney disease: Secondary | ICD-10-CM | POA: Diagnosis not present

## 2013-06-21 NOTE — Telephone Encounter (Signed)
Call from pt's caregiver, Jeffrey [name] -  States pt is having low blood pressures and needs to be seen before his Nov appt w/Dr Mechele Claude (who saw him in the hospital). And he also has called the Dialysis Ctr.   Pt has not been seen at the [facility] before/his PCP is Dr Johnnye Sima. Requests to talk to Dr Mechele Claude. Instructed Jeffrey [name] to take pt to the ED if his BP's are very low and he thinks pt needs to be seen sooner - stated he had taken pt the ED (9/26); told him to take him back to the ED. Stated pt does not want to go; reiterated he needs to go back to the ED. I will send message to Dr. Mechele Claude.

## 2013-06-21 NOTE — Telephone Encounter (Signed)
Patient called and advised he was recently in the hospital and can not get a follow up until 07/2013 so he wanted to see Dr Johnnye Sima in the mean time. I checked with Dr Johnnye Sima and was advised to give the patient the first available appt.

## 2013-06-22 NOTE — Telephone Encounter (Signed)
Appt has been scheduled for Oct 7.

## 2013-06-26 ENCOUNTER — Encounter: Payer: Self-pay | Admitting: Infectious Diseases

## 2013-06-26 ENCOUNTER — Ambulatory Visit (INDEPENDENT_AMBULATORY_CARE_PROVIDER_SITE_OTHER): Payer: Medicare Other | Admitting: Infectious Diseases

## 2013-06-26 VITALS — BP 186/94 | HR 65 | Temp 98.2°F | Ht 72.0 in | Wt 191.0 lb

## 2013-06-26 DIAGNOSIS — N186 End stage renal disease: Secondary | ICD-10-CM | POA: Diagnosis not present

## 2013-06-26 DIAGNOSIS — I10 Essential (primary) hypertension: Secondary | ICD-10-CM | POA: Diagnosis not present

## 2013-06-26 DIAGNOSIS — B20 Human immunodeficiency virus [HIV] disease: Secondary | ICD-10-CM | POA: Diagnosis not present

## 2013-06-26 NOTE — Assessment & Plan Note (Signed)
Will give him his extra dose of hydralazine this AM.

## 2013-06-26 NOTE — Assessment & Plan Note (Addendum)
Need to get him back into HD/renal this week. Spoke with renal- asked that he take challenge his dry wt, take additional dose of hydralazine he has at home. Call home HD teaching.  Appreciate Dr Jason Nest help.

## 2013-06-26 NOTE — Assessment & Plan Note (Signed)
Doing well. Has gotten flu shot.

## 2013-06-26 NOTE — Progress Notes (Signed)
  Subjective:    Patient ID: Jeffrey [name], male    DOB: 09-01-1956, 57 y.o.   MRN: [id number]  HPI 57 yo M with hx of HIV+ recently admitted to [facility] with hypertensive urgency, asthma.  Still having headaches and BP still elevated. Has been taking hydralzaine, lebatolol. Had to go back to ED on 9-26 (out less than 24h) due to hypOtension.  Had HD last pm, thinks he will do again tonight (does at home). Has not seen his renal Dr in 2 months, needs to go back. Has renal f/u this week.   HIV 1 RNA Quant (copies/mL)  Date Value  04/24/2013 <20   10/12/2012 <20   05/31/2012 <20      CD4 T Cell Abs (cmm)  Date Value  04/24/2013 210*  10/12/2012 310*  05/31/2012 290*     Review of Systems  Constitutional: Negative for appetite change and unexpected weight change.  Respiratory: Negative for shortness of breath.   Cardiovascular: Negative for chest pain.  Gastrointestinal: Negative for diarrhea and constipation.  Genitourinary: Negative for dysuria.  Neurological: Positive for headaches.       Objective:   Physical Exam  Constitutional: He appears well-developed and well-nourished.  HENT:  Mouth/Throat: No oropharyngeal exudate.  Eyes: EOM are normal. Pupils are equal, round, and reactive to light.  Neck: Neck supple.  Cardiovascular: Normal rate, regular rhythm and normal heart sounds.   Pulmonary/Chest: Effort normal and breath sounds normal.  Abdominal: Soft. Bowel sounds are normal. He exhibits no distension. There is no tenderness. There is no rebound.  Musculoskeletal:  LUE fistula  Bruit/thrill.   Lymphadenopathy:    He has no cervical adenopathy.          Assessment & Plan:

## 2013-06-27 ENCOUNTER — Ambulatory Visit (INDEPENDENT_AMBULATORY_CARE_PROVIDER_SITE_OTHER): Payer: Medicare Other | Admitting: Internal Medicine

## 2013-06-27 ENCOUNTER — Encounter: Payer: Self-pay | Admitting: Internal Medicine

## 2013-06-27 VITALS — BP 154/83 | HR 68 | Temp 97.0°F | Ht 72.0 in | Wt 193.5 lb

## 2013-06-27 DIAGNOSIS — I10 Essential (primary) hypertension: Secondary | ICD-10-CM | POA: Diagnosis not present

## 2013-06-27 DIAGNOSIS — F17200 Nicotine dependence, unspecified, uncomplicated: Secondary | ICD-10-CM

## 2013-06-27 DIAGNOSIS — J449 Chronic obstructive pulmonary disease, unspecified: Secondary | ICD-10-CM

## 2013-06-27 DIAGNOSIS — Z720 Tobacco use: Secondary | ICD-10-CM

## 2013-06-27 MED ORDER — AMLODIPINE BESYLATE 10 MG PO TABS: 10.0000 mg | ORAL_TABLET | Freq: Every day | ORAL | Status: DC

## 2013-06-27 MED ORDER — NICOTINE POLACRILEX 2 MG MT GUM: 2.0000 mg | CHEWING_GUM | OROMUCOSAL | Status: DC | PRN

## 2013-06-27 NOTE — Assessment & Plan Note (Signed)
BP Readings from Last 3 Encounters:  06/27/13 154/83  06/26/13 186/94  06/17/13 154/90    Lab Results  Component Value Date   NA 136 06/16/2013   K 3.5 06/16/2013   CREATININE 5.98* 06/16/2013    Assessment: Blood pressure control: mildly elevated Progress toward BP goal:  improved Comments: BP remains mildly elevated, yet the patient notes hypotension following hemodialysis sessions.  Additionally, the patient is on amlodipine 10 mg twice per day, which is more than manufacturer's recommendation. To address the patient's blood pressure, we will decrease amlodipine to 10 mg daily, and adjust hydralazine based on his hemodialysis days, taking less on his hemodialysis days.  Plan: Medications:  Decrease Amlodipine to 10 mg daily.  Continue Labetalol 200 mg BID.  Take Hydralazine 50 mg TID on non-HD days, and 25 mg TID on HD days to reduce post-HD hypotension Educational resources provided: brochure Self management tools provided: home blood pressure logbook

## 2013-06-27 NOTE — Assessment & Plan Note (Signed)
  Assessment: Progress toward smoking cessation:  smoking the same amount Barriers to progress toward smoking cessation:  stress Comments: Stress is the main barrier to smoking cessation. The patient only smokes less than one cigarette per day. We discussed using nicotine gum for occasional cravings.  Plan: Instruction/counseling given:  I counseled patient on the dangers of tobacco use, advised patient to stop smoking, and reviewed strategies to maximize success. Educational resources provided:  [employer] Insurance account manager) brochure Self management tools provided:  smoking cessation plan (STAR Quit Plan) Medications to assist with smoking cessation:  Nicotine Gum Patient agreed to the following self-care plans for smoking cessation: go to the [employer] (https://scott-booker.info/)  Other plans: Pt agrees to think about using gum.

## 2013-06-27 NOTE — Progress Notes (Signed)
HPI The patient is a 57 y.o. male with a history of ESRD, HIV, HTN, tobacco abuse, presenting for a hospital follow-up appointment.  The patient was recently hospitalized 9/22-24 with elevated BP, due in part to volume overload, thought to be related to insufficient home HD.  At hospital discharge, his Hydralazine was increased to 50 mg TID.  Four days ago, BP dropped to 66/42 after HD, so the patient returned to hydralazine 25 TID.  His last HD session was 2 days ago.  Yesterday, his BP when measured at home was in the 150's.  Today, BP is 154/83.  He notes no LE edema, orthopnea, or PND.  The patient continues to smoke, reporting 1 pack usage in 1 month, stating that he may smoke up to 1 cig/day if he is stressed.  He notes fear about NRP's, concerned that they may raise his BP.  The patient is establishing care with Dr. Joelyn Oms, Nephrology, on 10/14.  The patient has completed PFT's.  I've called PFT lab to fax them here.  Patient wants to establish with a PCP.  He is interested in trying our clinic, though he is concerned about potentially seeing a different provider at each visit.  He also requests a list of physicians in the area.  I informed the patient he is welcome to establish here or elsewhere.  ROS: General: no fevers, chills, changes in weight, changes in appetite Skin: no rash HEENT: no blurry vision, hearing changes, sore throat Pulm: no dyspnea, coughing, wheezing CV: no chest pain, palpitations, shortness of breath Abd: no abdominal pain, nausea/vomiting, diarrhea/constipation GU: no dysuria, hematuria, polyuria Ext: no arthralgias, myalgias Neuro: no weakness, numbness, or tingling  Filed Vitals:   06/27/13 0944  BP: 154/83  Pulse: 68  Temp: 97 F (36.1 C)    PEX General: alert, cooperative, and in no apparent distress HEENT: pupils equal round and reactive to light, vision grossly intact, oropharynx clear and non-erythematous  Neck: supple, no  lymphadenopathy Lungs: clear to ascultation bilaterally, normal work of respiration, no wheezes, rales, ronchi Heart: regular rate and rhythm, no murmurs, gallops, or rubs Abdomen: soft, non-tender, non-distended, normal bowel sounds Extremities: no cyanosis, clubbing, or edema Neurologic: alert & oriented X3, cranial nerves II-XII intact, strength grossly intact, sensation intact to light touch  Current Outpatient Prescriptions on File Prior to Visit  Medication Sig Dispense Refill  . ALPRAZolam (XANAX) 0.5 MG tablet Take 1 tablet (0.5 mg total) by mouth 2 (two) times daily as needed for sleep.  60 tablet  2  . amLODipine (NORVASC) 10 MG tablet Take 10 mg by mouth 2 (two) times daily.      . calcitRIOL (ROCALTROL) 0.25 MCG capsule Take 0.25 mcg by mouth 2 (two) times daily.      . Calcium Acetate 667 MG TABS Take 1,334-2,001 mg by mouth 3 (three) times daily with meals. Takes 3 capsules with meals and 2 capsules with snacks      . cinacalcet (SENSIPAR) 30 MG tablet Take 30 mg by mouth daily.      . hydrALAZINE (APRESOLINE) 25 MG tablet Take 2 tablets (50 mg total) by mouth 3 (three) times daily.  90 tablet  0  . Ipratropium-Albuterol (COMBIVENT RESPIMAT) 20-100 MCG/ACT AERS respimat Inhale 1 puff into the lungs every 6 (six) hours as needed for wheezing.  1 Inhaler  2  . ISENTRESS 400 MG tablet TAKE 1 TABLET BY MOUTH TWICE DAILY  60 tablet  0  . labetalol (NORMODYNE) 200 MG  tablet Take 200 mg by mouth 2 (two) times daily.       Marland Kitchen lamivudine (EPIVIR-HBV) 5 MG/ML solution Take 5 mLs (25 mg total) by mouth daily.  240 mL  3  . multivitamin (RENA-VIT) TABS tablet Take 1 tablet by mouth daily.        Marland Kitchen tenofovir (VIREAD) 300 MG tablet Take 300 mg by mouth every 7 (seven) days. On Saturdays      . traZODone (DESYREL) 50 MG tablet Take 1 tablet (50 mg total) by mouth at bedtime.  30 tablet  1   No current facility-administered medications on file prior to visit.    Assessment/Plan

## 2013-06-27 NOTE — Patient Instructions (Signed)
General Instructions: Your blood pressure is elevated today.  We are making the following 2 changes. 1. The maximum recommended dose of Amlodipine is 10 mg once daily.  I only feel comfortable prescribing this dose. 2. Alternate your Hydralazine as follows: -on HD days, take 25 mg three times per day -on non-HD days, take 50 mg three times per day  You are so close to quitting smoking altogether!  I recommend trying nicotine gum for the occasional craving.  I've included a list of other providers in the area, in case you decide to transfer your care.  Otherwise, please follow-up with [name] in 3-4 weeks.   Treatment Goals:  Goals (1 Years of Data) as of 06/27/13   None      Progress Toward Treatment Goals:  Treatment Goal 06/27/2013  Blood pressure at goal  Stop smoking smoking the same amount    Self Care Goals & Plans:  Self Care Goal 06/27/2013  Manage my medications take my medicines as prescribed  Stop smoking go to the [employer] (https://scott-booker.info/)       Care Management & Community Referrals:  Referral 06/27/2013  Referrals made for care management support none needed      RESOURCE GUIDE  Insufficient Money for Medicine: Contact [employer]:  call [phone]  No Primary Care Doctor: - [place] - can help you locate a primary care doctor that  accepts your insurance, provides certain services, etc. - Physician Referral Service- [phone]  Agencies that provide inexpensive medical care: - [facility]  [facility]  [phone] - [facility]  [phone] - [facility]  [phone] - [facility]  [phone] - [facility]  [phone]  [place] Providers: - [facility]- [address], [address]  [phone], Mon-Fri 9am-7pm, Sat 9am-1pm - [facility]- [place], [address] [place]  [place], [address] [place]  [place]- [address]  [place], [address], [phone]  Only accepts [place] patients after they have their name  applied to their card  Self Pay (no insurance) in [place]: - Sickle Cell Patients - [facility]  [place], [facility]- [facility] [place]- [id number] [place], [facility]- see information above (Speak to [employer] if you do not have insurance)       -  [facility]- [place],  [place], [place]  Dr Vista Lawman-  [address], [place], [place], [place]       -  [facility] [place], [phone]       -  [facility]- [address], [place], also [address], [phone]       -     [facility]- [address], [place], 1st & 3rd Saturday         every month, 10am-1pm  -  [facility] [place]   [address] [place], [place].   Phone:  [phone], Fax:  [phone]. Hours of Operation:  9 am - 6 pm, M-F.  -     [facility]   [address], [address], [place]   Phone: [phone], Fax: [phone]. Hours of Operation:  8:30 am - 5:30 pm, M-F.

## 2013-06-28 NOTE — Progress Notes (Signed)
Case discussed with Dr. Owens Shark at the time of the visit, soon after the resident saw the patient.  We reviewed the resident's history and exam and pertinent patient test results.  I agree with the assessment, diagnosis and plan of care documented in the resident's note.

## 2013-06-30 DIAGNOSIS — E039 Hypothyroidism, unspecified: Secondary | ICD-10-CM | POA: Diagnosis not present

## 2013-07-07 ENCOUNTER — Encounter: Payer: Self-pay | Admitting: *Deleted

## 2013-07-18 ENCOUNTER — Encounter: Payer: Self-pay | Admitting: Internal Medicine

## 2013-07-18 ENCOUNTER — Ambulatory Visit (INDEPENDENT_AMBULATORY_CARE_PROVIDER_SITE_OTHER): Payer: Medicare Other | Admitting: Internal Medicine

## 2013-07-18 VITALS — BP 145/90 | HR 75 | Temp 97.6°F | Wt 189.8 lb

## 2013-07-18 DIAGNOSIS — F17200 Nicotine dependence, unspecified, uncomplicated: Secondary | ICD-10-CM | POA: Diagnosis not present

## 2013-07-18 DIAGNOSIS — I10 Essential (primary) hypertension: Secondary | ICD-10-CM

## 2013-07-18 DIAGNOSIS — Z720 Tobacco use: Secondary | ICD-10-CM

## 2013-07-18 NOTE — Progress Notes (Signed)
HPI The patient is a 57 y.o. male with a history of HIV (CD4 = 210), ESRD on home HD, HTN, tobacco abuse, COPD, presenting for a 3-week follow-up for HTN.  At the patient's last visit, he noted some episodes of hypotension after hemodialysis sessions, and his hydralazine dosage was changed. Today, the patient's blood pressure is only mildly elevated, but he does note a blood pressure of 66/59 while on hemodialysis earlier today, with symptoms of lightheadedness and shakiness. He believes that his dry weight may need to be adjusted. The patient has established with a nephrologist, Dr. Joelyn Oms.  The patient continues to smoke the occasional cigarette. He has not yet tried nicotine gum as recommended at his last visit, but still has interest in trying this.  ROS: General: no fevers, chills, changes in weight, changes in appetite Skin: no rash HEENT: no blurry vision, hearing changes, sore throat Pulm: no dyspnea, coughing, wheezing CV: no chest pain, palpitations, shortness of breath Abd: no abdominal pain, nausea/vomiting, diarrhea/constipation GU: no dysuria, hematuria, polyuria Ext: no arthralgias, myalgias Neuro: no weakness, numbness, or tingling  Filed Vitals:   07/18/13 1425  BP: 145/90  Pulse: 75  Temp: 97.6 F (36.4 C)    PEX General: alert, cooperative, and in no apparent distress HEENT: pupils equal round and reactive to light, vision grossly intact, oropharynx clear and non-erythematous  Neck: supple Lungs: clear to ascultation bilaterally, normal work of respiration, no wheezes, rales, ronchi Heart: regular rate and rhythm, no murmurs, gallops, or rubs Abdomen: soft, non-tender, non-distended, normal bowel sounds Extremities: no cyanosis, clubbing, or edema Neurologic: alert & oriented X3, cranial nerves II-XII intact, strength grossly intact, sensation intact to light touch  Current Outpatient Prescriptions on File Prior to Visit  Medication Sig Dispense Refill  .  ALPRAZolam (XANAX) 0.5 MG tablet Take 1 tablet (0.5 mg total) by mouth 2 (two) times daily as needed for sleep.  60 tablet  2  . amLODipine (NORVASC) 10 MG tablet Take 1 tablet (10 mg total) by mouth daily.  30 tablet  0  . calcitRIOL (ROCALTROL) 0.25 MCG capsule Take 0.25 mcg by mouth 2 (two) times daily.      . Calcium Acetate 667 MG TABS Take 1,334-2,001 mg by mouth 3 (three) times daily with meals. Takes 3 capsules with meals and 2 capsules with snacks      . cinacalcet (SENSIPAR) 30 MG tablet Take 30 mg by mouth daily.      . hydrALAZINE (APRESOLINE) 25 MG tablet Take 2 tablets (50 mg total) by mouth 3 (three) times daily.  90 tablet  0  . Ipratropium-Albuterol (COMBIVENT RESPIMAT) 20-100 MCG/ACT AERS respimat Inhale 1 puff into the lungs every 6 (six) hours as needed for wheezing.  1 Inhaler  2  . ISENTRESS 400 MG tablet TAKE 1 TABLET BY MOUTH TWICE DAILY  60 tablet  0  . labetalol (NORMODYNE) 200 MG tablet Take 200 mg by mouth 2 (two) times daily.       Marland Kitchen lamivudine (EPIVIR-HBV) 5 MG/ML solution Take 5 mLs (25 mg total) by mouth daily.  240 mL  3  . multivitamin (RENA-VIT) TABS tablet Take 1 tablet by mouth daily.        . nicotine polacrilex (NICORETTE) 2 MG gum Take 1 each (2 mg total) by mouth as needed for smoking cessation.  50 tablet  2  . tenofovir (VIREAD) 300 MG tablet Take 300 mg by mouth every 7 (seven) days. On Saturdays      .  traZODone (DESYREL) 50 MG tablet Take 1 tablet (50 mg total) by mouth at bedtime.  30 tablet  1   No current facility-administered medications on file prior to visit.    Assessment/Plan

## 2013-07-18 NOTE — Assessment & Plan Note (Signed)
  Assessment: Progress toward smoking cessation:  smoking the same amount Barriers to progress toward smoking cessation:  none Comments: The patient continues to express interest in quitting smoking, but has not yet tried.  Plan: Instruction/counseling given:  I counseled patient on the dangers of tobacco use, advised patient to stop smoking, and reviewed strategies to maximize success. Educational resources provided:  [employer] Insurance account manager) brochure Self management tools provided:  smoking cessation plan (STAR Quit Plan) Medications to assist with smoking cessation:  Nicotine Gum Patient agreed to the following self-care plans for smoking cessation: call [employer] (1-800-QUIT-NOW);go to the [employer] (www.quitlinenc.com);cut down the number of cigarettes smoked  Other plans: Retry nicotine gum, reassess at next visit

## 2013-07-18 NOTE — Assessment & Plan Note (Signed)
BP Readings from Last 3 Encounters:  07/18/13 145/90  06/27/13 154/83  06/26/13 186/94    Lab Results  Component Value Date   NA 136 06/16/2013   K 3.5 06/16/2013   CREATININE 5.98* 06/16/2013    Assessment: Blood pressure control: controlled Progress toward BP goal:  at goal Comments: The patient's blood pressure is slightly better controlled than last at last visit, though he does note 1 episode of hypotension with hemodialysis.  We will alter his hydralazine dosage slightly.  Plan: Medications:  Continue amlodipine 10 mg daily, labetalol 200 mg twice per day. Take hydralazine 50 mg tablets twice per day, skipping the evening before and the morning of hemodialysis sessions. Educational resources provided: brochure Self management tools provided: home blood pressure logbook Other plans: Recheck blood pressure at next visit

## 2013-07-18 NOTE — Patient Instructions (Signed)
General Instructions: Your blood pressure is well-controlled today -continue Amlodipine 10 mg daily, Labetalol 200 mg twice per day -continue Hydralazine 50 mg, twice per day.  Skip this on the evening before, and morning of dialysis sessions  I encourage you to try to quit smoking altogether.  I believe you may find Nicotine gum beneficial.  Please return for a follow-up visit in about 3 months.  Treatment Goals:  Goals (1 Years of Data) as of 07/18/13   None      Progress Toward Treatment Goals:  Treatment Goal 07/18/2013  Blood pressure at goal  Stop smoking smoking the same amount    Self Care Goals & Plans:  Self Care Goal 07/18/2013  Manage my medications take my medicines as prescribed; refill my medications on time  Monitor my health keep track of my blood pressure; bring my blood pressure log to each visit  Eat healthy foods eat foods that are low in salt; eat baked foods instead of fried foods  Stop smoking call [employer] (1-800-QUIT-NOW); go to the [employer] (https://scott-booker.info/); cut down the number of cigarettes smoked    No flowsheet data found.   Care Management & Community Referrals:  Referral 07/18/2013  Referrals made for care management support none needed

## 2013-07-19 NOTE — Progress Notes (Signed)
Case discussed with Dr. Brown at the time of the visit.  We reviewed the resident's history and exam and pertinent patient test results.  I agree with the assessment, diagnosis and plan of care documented in the resident's note. 

## 2013-07-21 DIAGNOSIS — N186 End stage renal disease: Secondary | ICD-10-CM | POA: Diagnosis not present

## 2013-07-23 DIAGNOSIS — N186 End stage renal disease: Secondary | ICD-10-CM | POA: Diagnosis not present

## 2013-07-23 DIAGNOSIS — D509 Iron deficiency anemia, unspecified: Secondary | ICD-10-CM | POA: Diagnosis not present

## 2013-07-23 DIAGNOSIS — D631 Anemia in chronic kidney disease: Secondary | ICD-10-CM | POA: Diagnosis not present

## 2013-07-24 ENCOUNTER — Ambulatory Visit: Payer: Medicare Other | Admitting: Infectious Diseases

## 2013-07-27 ENCOUNTER — Other Ambulatory Visit: Payer: Self-pay | Admitting: Internal Medicine

## 2013-07-27 DIAGNOSIS — B20 Human immunodeficiency virus [HIV] disease: Secondary | ICD-10-CM

## 2013-08-16 ENCOUNTER — Encounter: Payer: Medicare Other | Admitting: Internal Medicine

## 2013-08-20 DIAGNOSIS — N186 End stage renal disease: Secondary | ICD-10-CM | POA: Diagnosis not present

## 2013-08-21 DIAGNOSIS — N186 End stage renal disease: Secondary | ICD-10-CM | POA: Diagnosis not present

## 2013-08-21 DIAGNOSIS — D509 Iron deficiency anemia, unspecified: Secondary | ICD-10-CM | POA: Diagnosis not present

## 2013-08-23 DIAGNOSIS — N186 End stage renal disease: Secondary | ICD-10-CM | POA: Diagnosis not present

## 2013-08-23 DIAGNOSIS — D509 Iron deficiency anemia, unspecified: Secondary | ICD-10-CM | POA: Diagnosis not present

## 2013-08-25 DIAGNOSIS — N186 End stage renal disease: Secondary | ICD-10-CM | POA: Diagnosis not present

## 2013-08-25 DIAGNOSIS — D509 Iron deficiency anemia, unspecified: Secondary | ICD-10-CM | POA: Diagnosis not present

## 2013-08-27 DIAGNOSIS — N186 End stage renal disease: Secondary | ICD-10-CM | POA: Diagnosis not present

## 2013-08-27 DIAGNOSIS — D509 Iron deficiency anemia, unspecified: Secondary | ICD-10-CM | POA: Diagnosis not present

## 2013-08-28 ENCOUNTER — Other Ambulatory Visit: Payer: Self-pay | Admitting: *Deleted

## 2013-08-28 DIAGNOSIS — D509 Iron deficiency anemia, unspecified: Secondary | ICD-10-CM | POA: Diagnosis not present

## 2013-08-28 DIAGNOSIS — N186 End stage renal disease: Secondary | ICD-10-CM | POA: Diagnosis not present

## 2013-08-28 DIAGNOSIS — B20 Human immunodeficiency virus [HIV] disease: Secondary | ICD-10-CM

## 2013-08-28 MED ORDER — TENOFOVIR DISOPROXIL FUMARATE 300 MG PO TABS: 300.0000 mg | ORAL_TABLET | ORAL | Status: DC

## 2013-08-29 DIAGNOSIS — D509 Iron deficiency anemia, unspecified: Secondary | ICD-10-CM | POA: Diagnosis not present

## 2013-08-29 DIAGNOSIS — N186 End stage renal disease: Secondary | ICD-10-CM | POA: Diagnosis not present

## 2013-08-31 DIAGNOSIS — D509 Iron deficiency anemia, unspecified: Secondary | ICD-10-CM | POA: Diagnosis not present

## 2013-08-31 DIAGNOSIS — N186 End stage renal disease: Secondary | ICD-10-CM | POA: Diagnosis not present

## 2013-09-01 DIAGNOSIS — N186 End stage renal disease: Secondary | ICD-10-CM | POA: Diagnosis not present

## 2013-09-01 DIAGNOSIS — D509 Iron deficiency anemia, unspecified: Secondary | ICD-10-CM | POA: Diagnosis not present

## 2013-09-04 DIAGNOSIS — D509 Iron deficiency anemia, unspecified: Secondary | ICD-10-CM | POA: Diagnosis not present

## 2013-09-04 DIAGNOSIS — N186 End stage renal disease: Secondary | ICD-10-CM | POA: Diagnosis not present

## 2013-09-06 DIAGNOSIS — N186 End stage renal disease: Secondary | ICD-10-CM | POA: Diagnosis not present

## 2013-09-06 DIAGNOSIS — D509 Iron deficiency anemia, unspecified: Secondary | ICD-10-CM | POA: Diagnosis not present

## 2013-09-07 DIAGNOSIS — N186 End stage renal disease: Secondary | ICD-10-CM | POA: Diagnosis not present

## 2013-09-07 DIAGNOSIS — D509 Iron deficiency anemia, unspecified: Secondary | ICD-10-CM | POA: Diagnosis not present

## 2013-09-08 DIAGNOSIS — D509 Iron deficiency anemia, unspecified: Secondary | ICD-10-CM | POA: Diagnosis not present

## 2013-09-08 DIAGNOSIS — N186 End stage renal disease: Secondary | ICD-10-CM | POA: Diagnosis not present

## 2013-09-10 DIAGNOSIS — N186 End stage renal disease: Secondary | ICD-10-CM | POA: Diagnosis not present

## 2013-09-10 DIAGNOSIS — D509 Iron deficiency anemia, unspecified: Secondary | ICD-10-CM | POA: Diagnosis not present

## 2013-09-12 DIAGNOSIS — D509 Iron deficiency anemia, unspecified: Secondary | ICD-10-CM | POA: Diagnosis not present

## 2013-09-12 DIAGNOSIS — N186 End stage renal disease: Secondary | ICD-10-CM | POA: Diagnosis not present

## 2013-09-13 ENCOUNTER — Other Ambulatory Visit (HOSPITAL_COMMUNITY): Payer: Self-pay | Admitting: Internal Medicine

## 2013-09-15 DIAGNOSIS — D509 Iron deficiency anemia, unspecified: Secondary | ICD-10-CM | POA: Diagnosis not present

## 2013-09-15 DIAGNOSIS — N186 End stage renal disease: Secondary | ICD-10-CM | POA: Diagnosis not present

## 2013-09-18 DIAGNOSIS — N186 End stage renal disease: Secondary | ICD-10-CM | POA: Diagnosis not present

## 2013-09-18 DIAGNOSIS — D509 Iron deficiency anemia, unspecified: Secondary | ICD-10-CM | POA: Diagnosis not present

## 2013-09-20 DIAGNOSIS — N186 End stage renal disease: Secondary | ICD-10-CM | POA: Diagnosis not present

## 2013-09-21 DIAGNOSIS — N2581 Secondary hyperparathyroidism of renal origin: Secondary | ICD-10-CM | POA: Diagnosis not present

## 2013-09-21 DIAGNOSIS — D509 Iron deficiency anemia, unspecified: Secondary | ICD-10-CM | POA: Diagnosis not present

## 2013-09-21 DIAGNOSIS — Z992 Dependence on renal dialysis: Secondary | ICD-10-CM | POA: Diagnosis not present

## 2013-09-21 DIAGNOSIS — N186 End stage renal disease: Secondary | ICD-10-CM | POA: Diagnosis not present

## 2013-09-23 DIAGNOSIS — N2581 Secondary hyperparathyroidism of renal origin: Secondary | ICD-10-CM | POA: Diagnosis not present

## 2013-09-23 DIAGNOSIS — D509 Iron deficiency anemia, unspecified: Secondary | ICD-10-CM | POA: Diagnosis not present

## 2013-09-23 DIAGNOSIS — N186 End stage renal disease: Secondary | ICD-10-CM | POA: Diagnosis not present

## 2013-09-23 DIAGNOSIS — Z992 Dependence on renal dialysis: Secondary | ICD-10-CM | POA: Diagnosis not present

## 2013-09-25 DIAGNOSIS — D509 Iron deficiency anemia, unspecified: Secondary | ICD-10-CM | POA: Diagnosis not present

## 2013-09-25 DIAGNOSIS — N186 End stage renal disease: Secondary | ICD-10-CM | POA: Diagnosis not present

## 2013-09-25 DIAGNOSIS — N2581 Secondary hyperparathyroidism of renal origin: Secondary | ICD-10-CM | POA: Diagnosis not present

## 2013-09-25 DIAGNOSIS — Z992 Dependence on renal dialysis: Secondary | ICD-10-CM | POA: Diagnosis not present

## 2013-09-26 DIAGNOSIS — D509 Iron deficiency anemia, unspecified: Secondary | ICD-10-CM | POA: Diagnosis not present

## 2013-09-26 DIAGNOSIS — N186 End stage renal disease: Secondary | ICD-10-CM | POA: Diagnosis not present

## 2013-09-26 DIAGNOSIS — Z992 Dependence on renal dialysis: Secondary | ICD-10-CM | POA: Diagnosis not present

## 2013-09-26 DIAGNOSIS — N2581 Secondary hyperparathyroidism of renal origin: Secondary | ICD-10-CM | POA: Diagnosis not present

## 2013-09-29 DIAGNOSIS — N2581 Secondary hyperparathyroidism of renal origin: Secondary | ICD-10-CM | POA: Diagnosis not present

## 2013-09-29 DIAGNOSIS — N186 End stage renal disease: Secondary | ICD-10-CM | POA: Diagnosis not present

## 2013-09-29 DIAGNOSIS — D509 Iron deficiency anemia, unspecified: Secondary | ICD-10-CM | POA: Diagnosis not present

## 2013-09-29 DIAGNOSIS — Z992 Dependence on renal dialysis: Secondary | ICD-10-CM | POA: Diagnosis not present

## 2013-10-02 DIAGNOSIS — N186 End stage renal disease: Secondary | ICD-10-CM | POA: Diagnosis not present

## 2013-10-02 DIAGNOSIS — N2581 Secondary hyperparathyroidism of renal origin: Secondary | ICD-10-CM | POA: Diagnosis not present

## 2013-10-02 DIAGNOSIS — D509 Iron deficiency anemia, unspecified: Secondary | ICD-10-CM | POA: Diagnosis not present

## 2013-10-02 DIAGNOSIS — Z992 Dependence on renal dialysis: Secondary | ICD-10-CM | POA: Diagnosis not present

## 2013-10-03 DIAGNOSIS — Z992 Dependence on renal dialysis: Secondary | ICD-10-CM | POA: Diagnosis not present

## 2013-10-03 DIAGNOSIS — N2581 Secondary hyperparathyroidism of renal origin: Secondary | ICD-10-CM | POA: Diagnosis not present

## 2013-10-03 DIAGNOSIS — D509 Iron deficiency anemia, unspecified: Secondary | ICD-10-CM | POA: Diagnosis not present

## 2013-10-03 DIAGNOSIS — N186 End stage renal disease: Secondary | ICD-10-CM | POA: Diagnosis not present

## 2013-10-06 DIAGNOSIS — Z992 Dependence on renal dialysis: Secondary | ICD-10-CM | POA: Diagnosis not present

## 2013-10-06 DIAGNOSIS — N186 End stage renal disease: Secondary | ICD-10-CM | POA: Diagnosis not present

## 2013-10-06 DIAGNOSIS — D509 Iron deficiency anemia, unspecified: Secondary | ICD-10-CM | POA: Diagnosis not present

## 2013-10-06 DIAGNOSIS — N2581 Secondary hyperparathyroidism of renal origin: Secondary | ICD-10-CM | POA: Diagnosis not present

## 2013-10-09 DIAGNOSIS — Z992 Dependence on renal dialysis: Secondary | ICD-10-CM | POA: Diagnosis not present

## 2013-10-09 DIAGNOSIS — N186 End stage renal disease: Secondary | ICD-10-CM | POA: Diagnosis not present

## 2013-10-09 DIAGNOSIS — D509 Iron deficiency anemia, unspecified: Secondary | ICD-10-CM | POA: Diagnosis not present

## 2013-10-09 DIAGNOSIS — N2581 Secondary hyperparathyroidism of renal origin: Secondary | ICD-10-CM | POA: Diagnosis not present

## 2013-10-11 DIAGNOSIS — Z992 Dependence on renal dialysis: Secondary | ICD-10-CM | POA: Diagnosis not present

## 2013-10-11 DIAGNOSIS — N186 End stage renal disease: Secondary | ICD-10-CM | POA: Diagnosis not present

## 2013-10-11 DIAGNOSIS — N2581 Secondary hyperparathyroidism of renal origin: Secondary | ICD-10-CM | POA: Diagnosis not present

## 2013-10-11 DIAGNOSIS — D509 Iron deficiency anemia, unspecified: Secondary | ICD-10-CM | POA: Diagnosis not present

## 2013-10-14 DIAGNOSIS — N2581 Secondary hyperparathyroidism of renal origin: Secondary | ICD-10-CM | POA: Diagnosis not present

## 2013-10-14 DIAGNOSIS — Z992 Dependence on renal dialysis: Secondary | ICD-10-CM | POA: Diagnosis not present

## 2013-10-14 DIAGNOSIS — N186 End stage renal disease: Secondary | ICD-10-CM | POA: Diagnosis not present

## 2013-10-14 DIAGNOSIS — D509 Iron deficiency anemia, unspecified: Secondary | ICD-10-CM | POA: Diagnosis not present

## 2013-10-17 DIAGNOSIS — D509 Iron deficiency anemia, unspecified: Secondary | ICD-10-CM | POA: Diagnosis not present

## 2013-10-17 DIAGNOSIS — N186 End stage renal disease: Secondary | ICD-10-CM | POA: Diagnosis not present

## 2013-10-17 DIAGNOSIS — Z992 Dependence on renal dialysis: Secondary | ICD-10-CM | POA: Diagnosis not present

## 2013-10-17 DIAGNOSIS — N2581 Secondary hyperparathyroidism of renal origin: Secondary | ICD-10-CM | POA: Diagnosis not present

## 2013-10-19 DIAGNOSIS — D509 Iron deficiency anemia, unspecified: Secondary | ICD-10-CM | POA: Diagnosis not present

## 2013-10-19 DIAGNOSIS — Z992 Dependence on renal dialysis: Secondary | ICD-10-CM | POA: Diagnosis not present

## 2013-10-19 DIAGNOSIS — N186 End stage renal disease: Secondary | ICD-10-CM | POA: Diagnosis not present

## 2013-10-19 DIAGNOSIS — N2581 Secondary hyperparathyroidism of renal origin: Secondary | ICD-10-CM | POA: Diagnosis not present

## 2013-10-21 DIAGNOSIS — Z992 Dependence on renal dialysis: Secondary | ICD-10-CM | POA: Diagnosis not present

## 2013-10-21 DIAGNOSIS — N186 End stage renal disease: Secondary | ICD-10-CM | POA: Diagnosis not present

## 2013-10-21 DIAGNOSIS — N2581 Secondary hyperparathyroidism of renal origin: Secondary | ICD-10-CM | POA: Diagnosis not present

## 2013-10-21 DIAGNOSIS — D509 Iron deficiency anemia, unspecified: Secondary | ICD-10-CM | POA: Diagnosis not present

## 2013-10-23 DIAGNOSIS — D509 Iron deficiency anemia, unspecified: Secondary | ICD-10-CM | POA: Diagnosis not present

## 2013-10-23 DIAGNOSIS — N186 End stage renal disease: Secondary | ICD-10-CM | POA: Diagnosis not present

## 2013-10-24 ENCOUNTER — Other Ambulatory Visit: Payer: Medicare Other

## 2013-10-24 DIAGNOSIS — Z79899 Other long term (current) drug therapy: Secondary | ICD-10-CM

## 2013-10-24 DIAGNOSIS — B20 Human immunodeficiency virus [HIV] disease: Secondary | ICD-10-CM | POA: Diagnosis not present

## 2013-10-24 DIAGNOSIS — Z113 Encounter for screening for infections with a predominantly sexual mode of transmission: Secondary | ICD-10-CM

## 2013-10-24 LAB — COMPLETE METABOLIC PANEL WITH GFR
ALK PHOS: 55 U/L (ref 39–117)
ALT: 8 U/L (ref 0–53)
AST: 11 U/L (ref 0–37)
Albumin: 4.4 g/dL (ref 3.5–5.2)
BILIRUBIN TOTAL: 0.4 mg/dL (ref 0.2–1.2)
BUN: 58 mg/dL — ABNORMAL HIGH (ref 6–23)
CHLORIDE: 93 meq/L — AB (ref 96–112)
CO2: 29 mEq/L (ref 19–32)
CREATININE: 9.8 mg/dL — AB (ref 0.50–1.35)
Calcium: 9.7 mg/dL (ref 8.4–10.5)
GFR, Est African American: 6 mL/min — ABNORMAL LOW
GFR, Est Non African American: 5 mL/min — ABNORMAL LOW
Glucose, Bld: 90 mg/dL (ref 70–99)
Potassium: 4.3 mEq/L (ref 3.5–5.3)
Sodium: 136 mEq/L (ref 135–145)
Total Protein: 8.3 g/dL (ref 6.0–8.3)

## 2013-10-24 LAB — LIPID PANEL
Cholesterol: 114 mg/dL (ref 0–200)
HDL: 31 mg/dL — AB (ref >39)
LDL Cholesterol: 62 mg/dL (ref 0–99)
TRIGLYCERIDES: 106 mg/dL (ref <150)
Total CHOL/HDL Ratio: 3.7 Ratio
VLDL: 21 mg/dL (ref 0–40)

## 2013-10-25 DIAGNOSIS — N186 End stage renal disease: Secondary | ICD-10-CM | POA: Diagnosis not present

## 2013-10-25 DIAGNOSIS — D509 Iron deficiency anemia, unspecified: Secondary | ICD-10-CM | POA: Diagnosis not present

## 2013-10-25 LAB — CBC WITH DIFFERENTIAL/PLATELET
BASOS ABS: 0 10*3/uL (ref 0.0–0.1)
Basophils Relative: 0 % (ref 0–1)
EOS PCT: 8 % — AB (ref 0–5)
Eosinophils Absolute: 0.4 10*3/uL (ref 0.0–0.7)
HEMATOCRIT: 31.2 % — AB (ref 39.0–52.0)
Hemoglobin: 10.6 g/dL — ABNORMAL LOW (ref 13.0–17.0)
Lymphocytes Relative: 29 % (ref 12–46)
Lymphs Abs: 1.5 10*3/uL (ref 0.7–4.0)
MCH: 31.1 pg (ref 26.0–34.0)
MCHC: 34 g/dL (ref 30.0–36.0)
MCV: 91.5 fL (ref 78.0–100.0)
Monocytes Absolute: 0.5 10*3/uL (ref 0.1–1.0)
Monocytes Relative: 9 % (ref 3–12)
Neutro Abs: 2.9 10*3/uL (ref 1.7–7.7)
Neutrophils Relative %: 54 % (ref 43–77)
PLATELETS: 174 10*3/uL (ref 150–400)
RBC: 3.41 MIL/uL — ABNORMAL LOW (ref 4.22–5.81)
RDW: 14.3 % (ref 11.5–15.5)
WBC: 5.3 10*3/uL (ref 4.0–10.5)

## 2013-10-25 LAB — T-HELPER CELL (CD4) - (RCID CLINIC ONLY)
CD4 % Helper T Cell: 25 % — ABNORMAL LOW (ref 33–55)
CD4 T Cell Abs: 360 /uL — ABNORMAL LOW (ref 400–2700)

## 2013-10-25 LAB — RPR

## 2013-10-25 LAB — HIV-1 RNA QUANT-NO REFLEX-BLD: HIV 1 RNA Quant: <20 copies/mL (ref <20)

## 2013-10-26 ENCOUNTER — Encounter: Payer: Self-pay | Admitting: Internal Medicine

## 2013-10-26 ENCOUNTER — Ambulatory Visit (INDEPENDENT_AMBULATORY_CARE_PROVIDER_SITE_OTHER): Payer: Medicare Other | Admitting: Internal Medicine

## 2013-10-26 VITALS — BP 148/88 | HR 74 | Temp 98.5°F | Ht 72.0 in | Wt 204.6 lb

## 2013-10-26 DIAGNOSIS — H543 Unqualified visual loss, both eyes: Secondary | ICD-10-CM

## 2013-10-26 DIAGNOSIS — F17200 Nicotine dependence, unspecified, uncomplicated: Secondary | ICD-10-CM

## 2013-10-26 DIAGNOSIS — L299 Pruritus, unspecified: Secondary | ICD-10-CM

## 2013-10-26 DIAGNOSIS — H5213 Myopia, bilateral: Secondary | ICD-10-CM | POA: Insufficient documentation

## 2013-10-26 DIAGNOSIS — I10 Essential (primary) hypertension: Secondary | ICD-10-CM

## 2013-10-26 DIAGNOSIS — Z720 Tobacco use: Secondary | ICD-10-CM

## 2013-10-26 DIAGNOSIS — H524 Presbyopia: Secondary | ICD-10-CM

## 2013-10-26 MED ORDER — DIPHENHYDRAMINE HCL 25 MG PO TABS: 25.0000 mg | ORAL_TABLET | Freq: Four times a day (QID) | ORAL | Status: DC | PRN

## 2013-10-26 NOTE — Assessment & Plan Note (Signed)
  Assessment: Progress toward smoking cessation:  smoking less Barriers to progress toward smoking cessation:  lack of motivation to quit Comments: Low motivation to quit entirely  Plan: Instruction/counseling given:  I counseled patient on the dangers of tobacco use, advised patient to stop smoking, and reviewed strategies to maximize success. Educational resources provided:    Self management tools provided:    Medications to assist with smoking cessation:  None Patient agreed to the following self-care plans for smoking cessation: set a quit date and stop smoking  Other plans: Patient reports minimal smoking, encouraged to quit entirely.

## 2013-10-26 NOTE — Assessment & Plan Note (Signed)
Unclear cause of his generalized pruritis.  Given its short duration I think this is likely an allergic reaction to some environmental source.  I recommended he keep a food/ environmental journal to see any associations.  I also prescribed benadryl for symptomatic relief. He is to call the office if no improvement in his symptoms.   I do not suspect Polycythemia Vera at this time, he has anemia, no elevated platelet count, no other symptoms.

## 2013-10-26 NOTE — Assessment & Plan Note (Signed)
Patient reports gradual decreased vision.  He reports his glasses do help but that he reports they do not seem to be as clear as they once were.  He denies any headache or other associated symptoms.   I will refer him to optometry for further evaluation of his vision and potentially a new eyeglass prescription.

## 2013-10-26 NOTE — Progress Notes (Signed)
Patient ID: Jeffrey [name], male   DOB: 1955/10/22, 58 y.o.   MRN: [id number]   Subjective:   Patient ID: Jeffrey [name] male   DOB: Jul 01, 1956 58 y.o.   MRN: [id number]  HPI: Jeffrey [name] is a 58 y.o. AA male with PMH of HIV (last CD4, 320, VL undect in 10/24/13, followed by Dr. Johnnye [name]), HTN, ESRD on home HD.  Itching started 3 days ago, all over and constant, he reports he normally itches after getting out of the shower.  He reports he was at home when this itching started. Nothing has helped the itching, he does repot gold bond made the area feel warm but did not relieve the itching.  He reports that at his home dialysis he has with blood drawn for monthly labs, his blood is to be drawn this upcoming Monday for his regular labs. He has not eaten anything out of the ordinary lately, no new clothing, sheets, pets, detergents, soaps.   He saw Dr. Johnnye [name] for a rash back in 05/08/13 who recommend he return to his previous soap and detergent.  He is adamant that he has had no changes to his soap or detergent.  He also notes that he has had a bit of decreased vision over the past number of months.  He reports his last eyeglass prescription was about 5 years ago.  He denies any HA, eye pain, floaters, or other visual changes other than blurry vision.  He is a smoker reports smoked 1 pack over the past month.  Past Medical History  Diagnosis Date  . Hypertension   . Gout   . Anemia   . Seizures 06/02/2011  . Thrombocytopenia   . Anxiety   . Hepatitis B carrier   . HIV infection   . Seizure   . Asthma     per pt hx  . End stage renal disease on home HD 07/10/2011    Started HD in September 2012 at [facility] with a tunneled HD catheter, now on home HD with NxtStage. Dialyzing through AVF L lower arm with buttonhole technique as of mid 2014. His brother does the HD treatments at home.  They are roommates for 23 years.  The brother works 3rd shift and gets off about 8am and then puts Jeffrey [name] on HD in the  morning after getting home. Most of the time he does HD about 4 times a week, for about 4 hours per treatment. Cause of ESRD was HTN according to patient. He says he let his health go and ending up with complications, and that he didn't like seeing doctors in those days.  He says he was diagnosed with severe HTN when he lived in [place] in his 61's.    Current Outpatient Prescriptions  Medication Sig Dispense Refill  . ALPRAZolam (XANAX) 0.5 MG tablet Take 1 tablet (0.5 mg total) by mouth 2 (two) times daily as needed for sleep.  60 tablet  2  . amLODipine (NORVASC) 10 MG tablet Take 1 tablet (10 mg total) by mouth daily.  30 tablet  0  . calcitRIOL (ROCALTROL) 0.25 MCG capsule Take 0.25 mcg by mouth 2 (two) times daily.      . Calcium Acetate 667 MG TABS Take 1,334-2,001 mg by mouth 3 (three) times daily with meals. Takes 3 capsules with meals and 2 capsules with snacks      . cinacalcet (SENSIPAR) 30 MG tablet Take 30 mg by mouth daily.      Marland Kitchen  COMBIVENT RESPIMAT 20-100 MCG/ACT AERS respimat INHALE 1 PUFF BY MOUTH EVERY 6 HOURS AS NEEDED FOR WHEEZING  4 g  1  . hydrALAZINE (APRESOLINE) 25 MG tablet Take 2 tablets (50 mg total) by mouth 3 (three) times daily.  90 tablet  0  . ISENTRESS 400 MG tablet TAKE 1 TABLET BY MOUTH TWICE DAILY  60 tablet  5  . labetalol (NORMODYNE) 200 MG tablet Take 200 mg by mouth 2 (two) times daily.       Marland Kitchen lamivudine (EPIVIR-HBV) 5 MG/ML solution Take 5 mLs (25 mg total) by mouth daily.  240 mL  3  . multivitamin (RENA-VIT) TABS tablet Take 1 tablet by mouth daily.        . nicotine polacrilex (NICORETTE) 2 MG gum Take 1 each (2 mg total) by mouth as needed for smoking cessation.  50 tablet  2  . tenofovir (VIREAD) 300 MG tablet Take 1 tablet (300 mg total) by mouth every 7 (seven) days. After dialysis  5 tablet  5  . traZODone (DESYREL) 50 MG tablet Take 1 tablet (50 mg total) by mouth at bedtime.  30 tablet  1   No current facility-administered medications for  this visit.   Family History  Problem Relation Age of Onset  . Hypertension Mother   . Cancer Father    History   Social History  . Marital Status: Single    Spouse Name: N/A    Number of Children: N/A  . Years of Education: N/A   Social History Main Topics  . Smoking status: Current Some Day Smoker    Types: Cigarettes  . Smokeless tobacco: Never Used     Comment: not ready to quit yet; 1 pack lasts 2 months.  . Alcohol Use: No  . Drug Use: No     Comment: uds (+) cocaine in 08/2011 but pt states was taking sudafed at the time  . Sexual Activity: None   Other Topics Concern  . 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  HENT: Negative for ear pain, nosebleeds and sore throat.   Eyes: Positive for blurred vision (occasionally, thinks he needs new glasses (58 years old)). Negative for pain and discharge.  Respiratory: Negative for cough, sputum production and shortness of breath.   Cardiovascular: Negative for chest pain, palpitations and leg swelling.  Gastrointestinal: Negative for heartburn, vomiting, abdominal pain, diarrhea and constipation.  Genitourinary: Negative for dysuria and frequency.  Musculoskeletal: Negative for back pain, myalgias and neck pain.  Skin: Positive for itching. Negative for rash.  Neurological: Negative for dizziness, sensory change, weakness and headaches.  Endo/Heme/Allergies: Does not bruise/bleed easily.  Psychiatric/Behavioral: Negative for depression. The patient is not nervous/anxious and does not have insomnia.     Objective:  Physical Exam: Filed Vitals:   10/26/13 1519  BP: 154/98  Pulse: 74  Temp: 98.5 F (36.9 C)  TempSrc: Oral  Height: 6' (1.829 m)    Weight: 204 lb 9.6 oz (92.806 kg)  SpO2: 100%   Physical Exam  Nursing note and vitals reviewed. Constitutional: He is oriented to person, place, and time and well-developed, well-nourished, and in no distress. No distress.  HENT:  Head: Normocephalic and atraumatic.  Mouth/Throat: No oropharyngeal exudate.  Cardiovascular: Normal rate and regular rhythm.   No murmur heard. palpable thrill and bruit in LUE  Pulmonary/Chest: Breath sounds normal. No respiratory distress. He has no wheezes.  Abdominal: Soft. Bowel sounds are normal. He exhibits no distension. There is no tenderness. There is no rebound.  Musculoskeletal: He exhibits no edema.  Neurological: He is alert and oriented to person, place, and time.  Skin: Skin is warm and dry. No rash noted. He is not diaphoretic. No erythema.  No rash on upper or lower extremities, abdomen, chest, or back.  Some mild excoriation of b/l arms and legs.  Psychiatric: Affect and judgment normal.    Assessment & Plan:  Please see problem based assessment and plan.

## 2013-10-26 NOTE — Assessment & Plan Note (Signed)
BP Readings from Last 3 Encounters:  10/26/13 148/88  07/18/13 145/90  06/27/13 154/83    Lab Results  Component Value Date   NA 136 10/24/2013   K 4.3 10/24/2013   CREATININE 9.80* 10/24/2013    Assessment: Blood pressure control: mildly elevated Progress toward BP goal:  unchanged Comments:   Plan: Medications: Amlodipine 10mg  daily, labetalol 200mg  BID, Hydralazine 50mg  BID, skipping evening before and morning of dialysis Educational resources provided:   Self management tools provided:   Other plans: Continue to monitor.

## 2013-10-26 NOTE — Patient Instructions (Signed)
Try taking Benadryl 25mg  every 6 hours as needed for itching.  Please try to keep a journal of your itching, food, detergents, soaps, ect to try to help figure out what may be causing your itching.  Please keep you appointment with optometry to evaluate you eyesight.  It was great to see you, please call and let us know if your itching does not improve.

## 2013-10-27 DIAGNOSIS — D509 Iron deficiency anemia, unspecified: Secondary | ICD-10-CM | POA: Diagnosis not present

## 2013-10-27 DIAGNOSIS — N186 End stage renal disease: Secondary | ICD-10-CM | POA: Diagnosis not present

## 2013-10-29 DIAGNOSIS — D509 Iron deficiency anemia, unspecified: Secondary | ICD-10-CM | POA: Diagnosis not present

## 2013-10-29 DIAGNOSIS — N186 End stage renal disease: Secondary | ICD-10-CM | POA: Diagnosis not present

## 2013-10-30 DIAGNOSIS — N186 End stage renal disease: Secondary | ICD-10-CM | POA: Diagnosis not present

## 2013-10-30 DIAGNOSIS — D509 Iron deficiency anemia, unspecified: Secondary | ICD-10-CM | POA: Diagnosis not present

## 2013-10-30 NOTE — Progress Notes (Signed)
Case discussed with Dr. Hoffman at the time of the visit.  We reviewed the resident's history and exam and pertinent patient test results.  I agree with the assessment, diagnosis, and plan of care documented in the resident's note. 

## 2013-10-31 DIAGNOSIS — N186 End stage renal disease: Secondary | ICD-10-CM | POA: Diagnosis not present

## 2013-10-31 DIAGNOSIS — D509 Iron deficiency anemia, unspecified: Secondary | ICD-10-CM | POA: Diagnosis not present

## 2013-11-04 ENCOUNTER — Other Ambulatory Visit: Payer: Self-pay | Admitting: Infectious Diseases

## 2013-11-04 DIAGNOSIS — N186 End stage renal disease: Secondary | ICD-10-CM | POA: Diagnosis not present

## 2013-11-04 DIAGNOSIS — D509 Iron deficiency anemia, unspecified: Secondary | ICD-10-CM | POA: Diagnosis not present

## 2013-11-05 DIAGNOSIS — D509 Iron deficiency anemia, unspecified: Secondary | ICD-10-CM | POA: Diagnosis not present

## 2013-11-05 DIAGNOSIS — N186 End stage renal disease: Secondary | ICD-10-CM | POA: Diagnosis not present

## 2013-11-07 DIAGNOSIS — N186 End stage renal disease: Secondary | ICD-10-CM | POA: Diagnosis not present

## 2013-11-07 DIAGNOSIS — D509 Iron deficiency anemia, unspecified: Secondary | ICD-10-CM | POA: Diagnosis not present

## 2013-11-08 ENCOUNTER — Ambulatory Visit: Payer: Medicare Other | Admitting: Infectious Diseases

## 2013-11-08 ENCOUNTER — Telehealth: Payer: Self-pay | Admitting: *Deleted

## 2013-11-08 NOTE — Telephone Encounter (Signed)
Pt forgot appointment, rescheduled for 3/16 at 9:45.

## 2013-11-09 DIAGNOSIS — D509 Iron deficiency anemia, unspecified: Secondary | ICD-10-CM | POA: Diagnosis not present

## 2013-11-09 DIAGNOSIS — N186 End stage renal disease: Secondary | ICD-10-CM | POA: Diagnosis not present

## 2013-11-11 DIAGNOSIS — D509 Iron deficiency anemia, unspecified: Secondary | ICD-10-CM | POA: Diagnosis not present

## 2013-11-11 DIAGNOSIS — N186 End stage renal disease: Secondary | ICD-10-CM | POA: Diagnosis not present

## 2013-11-12 DIAGNOSIS — N186 End stage renal disease: Secondary | ICD-10-CM | POA: Diagnosis not present

## 2013-11-12 DIAGNOSIS — D509 Iron deficiency anemia, unspecified: Secondary | ICD-10-CM | POA: Diagnosis not present

## 2013-11-14 DIAGNOSIS — D509 Iron deficiency anemia, unspecified: Secondary | ICD-10-CM | POA: Diagnosis not present

## 2013-11-14 DIAGNOSIS — N186 End stage renal disease: Secondary | ICD-10-CM | POA: Diagnosis not present

## 2013-11-16 DIAGNOSIS — D509 Iron deficiency anemia, unspecified: Secondary | ICD-10-CM | POA: Diagnosis not present

## 2013-11-16 DIAGNOSIS — N186 End stage renal disease: Secondary | ICD-10-CM | POA: Diagnosis not present

## 2013-11-18 DIAGNOSIS — D509 Iron deficiency anemia, unspecified: Secondary | ICD-10-CM | POA: Diagnosis not present

## 2013-11-18 DIAGNOSIS — N186 End stage renal disease: Secondary | ICD-10-CM | POA: Diagnosis not present

## 2013-11-20 DIAGNOSIS — D509 Iron deficiency anemia, unspecified: Secondary | ICD-10-CM | POA: Diagnosis not present

## 2013-11-20 DIAGNOSIS — D631 Anemia in chronic kidney disease: Secondary | ICD-10-CM | POA: Diagnosis not present

## 2013-11-20 DIAGNOSIS — N039 Chronic nephritic syndrome with unspecified morphologic changes: Secondary | ICD-10-CM | POA: Diagnosis not present

## 2013-11-20 DIAGNOSIS — N186 End stage renal disease: Secondary | ICD-10-CM | POA: Diagnosis not present

## 2013-11-21 DIAGNOSIS — H04129 Dry eye syndrome of unspecified lacrimal gland: Secondary | ICD-10-CM | POA: Diagnosis not present

## 2013-11-21 DIAGNOSIS — H25019 Cortical age-related cataract, unspecified eye: Secondary | ICD-10-CM | POA: Diagnosis not present

## 2013-11-21 DIAGNOSIS — H2510 Age-related nuclear cataract, unspecified eye: Secondary | ICD-10-CM | POA: Diagnosis not present

## 2013-12-04 ENCOUNTER — Ambulatory Visit (INDEPENDENT_AMBULATORY_CARE_PROVIDER_SITE_OTHER): Payer: Medicare Other | Admitting: Infectious Diseases

## 2013-12-04 ENCOUNTER — Encounter: Payer: Self-pay | Admitting: Infectious Diseases

## 2013-12-04 VITALS — BP 119/83 | HR 86 | Temp 98.6°F

## 2013-12-04 DIAGNOSIS — F17200 Nicotine dependence, unspecified, uncomplicated: Secondary | ICD-10-CM | POA: Diagnosis not present

## 2013-12-04 DIAGNOSIS — B20 Human immunodeficiency virus [HIV] disease: Secondary | ICD-10-CM | POA: Diagnosis not present

## 2013-12-04 DIAGNOSIS — I10 Essential (primary) hypertension: Secondary | ICD-10-CM

## 2013-12-04 DIAGNOSIS — L299 Pruritus, unspecified: Secondary | ICD-10-CM | POA: Diagnosis not present

## 2013-12-04 DIAGNOSIS — N186 End stage renal disease: Secondary | ICD-10-CM

## 2013-12-04 DIAGNOSIS — H543 Unqualified visual loss, both eyes: Secondary | ICD-10-CM | POA: Diagnosis not present

## 2013-12-04 NOTE — Progress Notes (Signed)
   Subjective:    Patient ID: Jeffrey [name], male    DOB: 10-30-1955, 58 y.o.   MRN: [id number]  HPI 59 yo M with HIV+, ESRD, HTN. Feels like his BP is a little low today. Was taken off HCTZ recently (was prev on 2 tab tid).  Still on lebetalol, norvasc.   Has not taken any for the last couple of days. Has been feeling dizzy, has felt like he has had use bowels urgently.  No headaches, CP or SOB.  Came into clinic today in wheel chair due to dizzines.   HIV 1 RNA Quant (copies/mL)  Date Value  10/24/2013 <20   04/24/2013 <20   10/12/2012 <20      CD4 T Cell Abs (/uL)  Date Value  10/24/2013 360*  04/24/2013 210*  10/12/2012 310*     Review of Systems  Constitutional: Negative for fever, chills and appetite change.  Respiratory: Negative for shortness of breath.   Cardiovascular: Negative for chest pain.  Gastrointestinal: Negative for diarrhea and constipation.  Genitourinary: Negative for dysuria and difficulty urinating.  Neurological: Positive for dizziness. Negative for headaches.  pruritis     Objective:   Physical Exam  Constitutional: He appears well-developed and well-nourished.  HENT:  Mouth/Throat: No oropharyngeal exudate.  Eyes: EOM are normal. Pupils are equal, round, and reactive to light.  Neck: Neck supple.  Cardiovascular: Normal rate and normal heart sounds.   Pulmonary/Chest: Effort normal and breath sounds normal.  Abdominal: Soft. Bowel sounds are normal. He exhibits no distension. There is no tenderness.  Musculoskeletal: He exhibits no edema.  LUE fistula is clean, non-tender. +bruit  Lymphadenopathy:    He has no cervical adenopathy.          Assessment & Plan:

## 2013-12-04 NOTE — Assessment & Plan Note (Signed)
Currently off meds

## 2013-12-04 NOTE — Assessment & Plan Note (Addendum)
States he is being eval for txp. On HD. Spoke with his HD nurse, she will speak with his HD doctor about increasing his dry weight.

## 2013-12-04 NOTE — Assessment & Plan Note (Signed)
Taking TFV/3TC/ISN. Is working well.

## 2013-12-12 ENCOUNTER — Encounter: Payer: Self-pay | Admitting: Internal Medicine

## 2013-12-12 DIAGNOSIS — H2513 Age-related nuclear cataract, bilateral: Secondary | ICD-10-CM | POA: Insufficient documentation

## 2013-12-19 DIAGNOSIS — N186 End stage renal disease: Secondary | ICD-10-CM | POA: Diagnosis not present

## 2013-12-21 DIAGNOSIS — D631 Anemia in chronic kidney disease: Secondary | ICD-10-CM | POA: Diagnosis not present

## 2013-12-21 DIAGNOSIS — R7309 Other abnormal glucose: Secondary | ICD-10-CM | POA: Diagnosis not present

## 2013-12-21 DIAGNOSIS — N186 End stage renal disease: Secondary | ICD-10-CM | POA: Diagnosis not present

## 2013-12-21 DIAGNOSIS — N2581 Secondary hyperparathyroidism of renal origin: Secondary | ICD-10-CM | POA: Diagnosis not present

## 2013-12-21 DIAGNOSIS — D509 Iron deficiency anemia, unspecified: Secondary | ICD-10-CM | POA: Diagnosis not present

## 2013-12-22 DIAGNOSIS — N186 End stage renal disease: Secondary | ICD-10-CM | POA: Diagnosis not present

## 2013-12-22 DIAGNOSIS — D509 Iron deficiency anemia, unspecified: Secondary | ICD-10-CM | POA: Diagnosis not present

## 2013-12-22 DIAGNOSIS — R7309 Other abnormal glucose: Secondary | ICD-10-CM | POA: Diagnosis not present

## 2013-12-22 DIAGNOSIS — N2581 Secondary hyperparathyroidism of renal origin: Secondary | ICD-10-CM | POA: Diagnosis not present

## 2013-12-22 DIAGNOSIS — D631 Anemia in chronic kidney disease: Secondary | ICD-10-CM | POA: Diagnosis not present

## 2013-12-23 DIAGNOSIS — D509 Iron deficiency anemia, unspecified: Secondary | ICD-10-CM | POA: Diagnosis not present

## 2013-12-23 DIAGNOSIS — N186 End stage renal disease: Secondary | ICD-10-CM | POA: Diagnosis not present

## 2013-12-23 DIAGNOSIS — N2581 Secondary hyperparathyroidism of renal origin: Secondary | ICD-10-CM | POA: Diagnosis not present

## 2013-12-23 DIAGNOSIS — D631 Anemia in chronic kidney disease: Secondary | ICD-10-CM | POA: Diagnosis not present

## 2013-12-23 DIAGNOSIS — R7309 Other abnormal glucose: Secondary | ICD-10-CM | POA: Diagnosis not present

## 2013-12-25 DIAGNOSIS — D509 Iron deficiency anemia, unspecified: Secondary | ICD-10-CM | POA: Diagnosis not present

## 2013-12-25 DIAGNOSIS — N186 End stage renal disease: Secondary | ICD-10-CM | POA: Diagnosis not present

## 2013-12-25 DIAGNOSIS — R7309 Other abnormal glucose: Secondary | ICD-10-CM | POA: Diagnosis not present

## 2013-12-25 DIAGNOSIS — N2581 Secondary hyperparathyroidism of renal origin: Secondary | ICD-10-CM | POA: Diagnosis not present

## 2013-12-25 DIAGNOSIS — D631 Anemia in chronic kidney disease: Secondary | ICD-10-CM | POA: Diagnosis not present

## 2013-12-26 DIAGNOSIS — D631 Anemia in chronic kidney disease: Secondary | ICD-10-CM | POA: Diagnosis not present

## 2013-12-26 DIAGNOSIS — N2581 Secondary hyperparathyroidism of renal origin: Secondary | ICD-10-CM | POA: Diagnosis not present

## 2013-12-26 DIAGNOSIS — N186 End stage renal disease: Secondary | ICD-10-CM | POA: Diagnosis not present

## 2013-12-26 DIAGNOSIS — R7309 Other abnormal glucose: Secondary | ICD-10-CM | POA: Diagnosis not present

## 2013-12-26 DIAGNOSIS — D509 Iron deficiency anemia, unspecified: Secondary | ICD-10-CM | POA: Diagnosis not present

## 2013-12-27 DIAGNOSIS — R7309 Other abnormal glucose: Secondary | ICD-10-CM | POA: Diagnosis not present

## 2013-12-27 DIAGNOSIS — D509 Iron deficiency anemia, unspecified: Secondary | ICD-10-CM | POA: Diagnosis not present

## 2013-12-27 DIAGNOSIS — N186 End stage renal disease: Secondary | ICD-10-CM | POA: Diagnosis not present

## 2013-12-27 DIAGNOSIS — N2581 Secondary hyperparathyroidism of renal origin: Secondary | ICD-10-CM | POA: Diagnosis not present

## 2013-12-27 DIAGNOSIS — D631 Anemia in chronic kidney disease: Secondary | ICD-10-CM | POA: Diagnosis not present

## 2013-12-28 DIAGNOSIS — N186 End stage renal disease: Secondary | ICD-10-CM | POA: Diagnosis not present

## 2013-12-28 DIAGNOSIS — D509 Iron deficiency anemia, unspecified: Secondary | ICD-10-CM | POA: Diagnosis not present

## 2013-12-28 DIAGNOSIS — M109 Gout, unspecified: Secondary | ICD-10-CM | POA: Diagnosis not present

## 2013-12-28 DIAGNOSIS — N2581 Secondary hyperparathyroidism of renal origin: Secondary | ICD-10-CM | POA: Diagnosis not present

## 2013-12-28 DIAGNOSIS — D631 Anemia in chronic kidney disease: Secondary | ICD-10-CM | POA: Diagnosis not present

## 2013-12-28 DIAGNOSIS — R7309 Other abnormal glucose: Secondary | ICD-10-CM | POA: Diagnosis not present

## 2013-12-28 DIAGNOSIS — E785 Hyperlipidemia, unspecified: Secondary | ICD-10-CM | POA: Diagnosis not present

## 2013-12-29 DIAGNOSIS — D509 Iron deficiency anemia, unspecified: Secondary | ICD-10-CM | POA: Diagnosis not present

## 2013-12-29 DIAGNOSIS — N186 End stage renal disease: Secondary | ICD-10-CM | POA: Diagnosis not present

## 2013-12-29 DIAGNOSIS — N2581 Secondary hyperparathyroidism of renal origin: Secondary | ICD-10-CM | POA: Diagnosis not present

## 2013-12-29 DIAGNOSIS — R7309 Other abnormal glucose: Secondary | ICD-10-CM | POA: Diagnosis not present

## 2013-12-29 DIAGNOSIS — D631 Anemia in chronic kidney disease: Secondary | ICD-10-CM | POA: Diagnosis not present

## 2013-12-30 DIAGNOSIS — R7309 Other abnormal glucose: Secondary | ICD-10-CM | POA: Diagnosis not present

## 2013-12-30 DIAGNOSIS — N2581 Secondary hyperparathyroidism of renal origin: Secondary | ICD-10-CM | POA: Diagnosis not present

## 2013-12-30 DIAGNOSIS — D631 Anemia in chronic kidney disease: Secondary | ICD-10-CM | POA: Diagnosis not present

## 2013-12-30 DIAGNOSIS — N186 End stage renal disease: Secondary | ICD-10-CM | POA: Diagnosis not present

## 2013-12-30 DIAGNOSIS — D509 Iron deficiency anemia, unspecified: Secondary | ICD-10-CM | POA: Diagnosis not present

## 2014-01-01 DIAGNOSIS — D631 Anemia in chronic kidney disease: Secondary | ICD-10-CM | POA: Diagnosis not present

## 2014-01-01 DIAGNOSIS — N2581 Secondary hyperparathyroidism of renal origin: Secondary | ICD-10-CM | POA: Diagnosis not present

## 2014-01-01 DIAGNOSIS — R7309 Other abnormal glucose: Secondary | ICD-10-CM | POA: Diagnosis not present

## 2014-01-01 DIAGNOSIS — N186 End stage renal disease: Secondary | ICD-10-CM | POA: Diagnosis not present

## 2014-01-01 DIAGNOSIS — D509 Iron deficiency anemia, unspecified: Secondary | ICD-10-CM | POA: Diagnosis not present

## 2014-01-04 DIAGNOSIS — N2581 Secondary hyperparathyroidism of renal origin: Secondary | ICD-10-CM | POA: Diagnosis not present

## 2014-01-04 DIAGNOSIS — D509 Iron deficiency anemia, unspecified: Secondary | ICD-10-CM | POA: Diagnosis not present

## 2014-01-04 DIAGNOSIS — R7309 Other abnormal glucose: Secondary | ICD-10-CM | POA: Diagnosis not present

## 2014-01-04 DIAGNOSIS — N039 Chronic nephritic syndrome with unspecified morphologic changes: Secondary | ICD-10-CM | POA: Diagnosis not present

## 2014-01-04 DIAGNOSIS — D631 Anemia in chronic kidney disease: Secondary | ICD-10-CM | POA: Diagnosis not present

## 2014-01-04 DIAGNOSIS — N186 End stage renal disease: Secondary | ICD-10-CM | POA: Diagnosis not present

## 2014-01-05 DIAGNOSIS — N2581 Secondary hyperparathyroidism of renal origin: Secondary | ICD-10-CM | POA: Diagnosis not present

## 2014-01-05 DIAGNOSIS — D509 Iron deficiency anemia, unspecified: Secondary | ICD-10-CM | POA: Diagnosis not present

## 2014-01-05 DIAGNOSIS — D631 Anemia in chronic kidney disease: Secondary | ICD-10-CM | POA: Diagnosis not present

## 2014-01-05 DIAGNOSIS — R7309 Other abnormal glucose: Secondary | ICD-10-CM | POA: Diagnosis not present

## 2014-01-05 DIAGNOSIS — N186 End stage renal disease: Secondary | ICD-10-CM | POA: Diagnosis not present

## 2014-01-06 DIAGNOSIS — N2581 Secondary hyperparathyroidism of renal origin: Secondary | ICD-10-CM | POA: Diagnosis not present

## 2014-01-06 DIAGNOSIS — D509 Iron deficiency anemia, unspecified: Secondary | ICD-10-CM | POA: Diagnosis not present

## 2014-01-06 DIAGNOSIS — D631 Anemia in chronic kidney disease: Secondary | ICD-10-CM | POA: Diagnosis not present

## 2014-01-06 DIAGNOSIS — N186 End stage renal disease: Secondary | ICD-10-CM | POA: Diagnosis not present

## 2014-01-06 DIAGNOSIS — R7309 Other abnormal glucose: Secondary | ICD-10-CM | POA: Diagnosis not present

## 2014-01-08 DIAGNOSIS — N186 End stage renal disease: Secondary | ICD-10-CM | POA: Diagnosis not present

## 2014-01-08 DIAGNOSIS — D631 Anemia in chronic kidney disease: Secondary | ICD-10-CM | POA: Diagnosis not present

## 2014-01-08 DIAGNOSIS — N2581 Secondary hyperparathyroidism of renal origin: Secondary | ICD-10-CM | POA: Diagnosis not present

## 2014-01-08 DIAGNOSIS — D509 Iron deficiency anemia, unspecified: Secondary | ICD-10-CM | POA: Diagnosis not present

## 2014-01-08 DIAGNOSIS — N039 Chronic nephritic syndrome with unspecified morphologic changes: Secondary | ICD-10-CM | POA: Diagnosis not present

## 2014-01-08 DIAGNOSIS — R7309 Other abnormal glucose: Secondary | ICD-10-CM | POA: Diagnosis not present

## 2014-01-10 DIAGNOSIS — N2581 Secondary hyperparathyroidism of renal origin: Secondary | ICD-10-CM | POA: Diagnosis not present

## 2014-01-10 DIAGNOSIS — R7309 Other abnormal glucose: Secondary | ICD-10-CM | POA: Diagnosis not present

## 2014-01-10 DIAGNOSIS — D631 Anemia in chronic kidney disease: Secondary | ICD-10-CM | POA: Diagnosis not present

## 2014-01-10 DIAGNOSIS — D509 Iron deficiency anemia, unspecified: Secondary | ICD-10-CM | POA: Diagnosis not present

## 2014-01-10 DIAGNOSIS — N186 End stage renal disease: Secondary | ICD-10-CM | POA: Diagnosis not present

## 2014-01-12 DIAGNOSIS — R7309 Other abnormal glucose: Secondary | ICD-10-CM | POA: Diagnosis not present

## 2014-01-12 DIAGNOSIS — D631 Anemia in chronic kidney disease: Secondary | ICD-10-CM | POA: Diagnosis not present

## 2014-01-12 DIAGNOSIS — N039 Chronic nephritic syndrome with unspecified morphologic changes: Secondary | ICD-10-CM | POA: Diagnosis not present

## 2014-01-12 DIAGNOSIS — N2581 Secondary hyperparathyroidism of renal origin: Secondary | ICD-10-CM | POA: Diagnosis not present

## 2014-01-12 DIAGNOSIS — N186 End stage renal disease: Secondary | ICD-10-CM | POA: Diagnosis not present

## 2014-01-12 DIAGNOSIS — D509 Iron deficiency anemia, unspecified: Secondary | ICD-10-CM | POA: Diagnosis not present

## 2014-01-13 DIAGNOSIS — R7309 Other abnormal glucose: Secondary | ICD-10-CM | POA: Diagnosis not present

## 2014-01-13 DIAGNOSIS — N039 Chronic nephritic syndrome with unspecified morphologic changes: Secondary | ICD-10-CM | POA: Diagnosis not present

## 2014-01-13 DIAGNOSIS — N186 End stage renal disease: Secondary | ICD-10-CM | POA: Diagnosis not present

## 2014-01-13 DIAGNOSIS — N2581 Secondary hyperparathyroidism of renal origin: Secondary | ICD-10-CM | POA: Diagnosis not present

## 2014-01-13 DIAGNOSIS — D509 Iron deficiency anemia, unspecified: Secondary | ICD-10-CM | POA: Diagnosis not present

## 2014-01-13 DIAGNOSIS — D631 Anemia in chronic kidney disease: Secondary | ICD-10-CM | POA: Diagnosis not present

## 2014-01-15 DIAGNOSIS — N2581 Secondary hyperparathyroidism of renal origin: Secondary | ICD-10-CM | POA: Diagnosis not present

## 2014-01-15 DIAGNOSIS — D631 Anemia in chronic kidney disease: Secondary | ICD-10-CM | POA: Diagnosis not present

## 2014-01-15 DIAGNOSIS — R7309 Other abnormal glucose: Secondary | ICD-10-CM | POA: Diagnosis not present

## 2014-01-15 DIAGNOSIS — D509 Iron deficiency anemia, unspecified: Secondary | ICD-10-CM | POA: Diagnosis not present

## 2014-01-15 DIAGNOSIS — N186 End stage renal disease: Secondary | ICD-10-CM | POA: Diagnosis not present

## 2014-01-18 DIAGNOSIS — N186 End stage renal disease: Secondary | ICD-10-CM | POA: Diagnosis not present

## 2014-01-19 DIAGNOSIS — N039 Chronic nephritic syndrome with unspecified morphologic changes: Secondary | ICD-10-CM | POA: Diagnosis not present

## 2014-01-19 DIAGNOSIS — D631 Anemia in chronic kidney disease: Secondary | ICD-10-CM | POA: Diagnosis not present

## 2014-01-19 DIAGNOSIS — N186 End stage renal disease: Secondary | ICD-10-CM | POA: Diagnosis not present

## 2014-01-19 DIAGNOSIS — D509 Iron deficiency anemia, unspecified: Secondary | ICD-10-CM | POA: Diagnosis not present

## 2014-01-19 DIAGNOSIS — Z79899 Other long term (current) drug therapy: Secondary | ICD-10-CM | POA: Diagnosis not present

## 2014-01-19 DIAGNOSIS — R7309 Other abnormal glucose: Secondary | ICD-10-CM | POA: Diagnosis not present

## 2014-01-21 DIAGNOSIS — D631 Anemia in chronic kidney disease: Secondary | ICD-10-CM | POA: Diagnosis not present

## 2014-01-21 DIAGNOSIS — R7309 Other abnormal glucose: Secondary | ICD-10-CM | POA: Diagnosis not present

## 2014-01-21 DIAGNOSIS — D509 Iron deficiency anemia, unspecified: Secondary | ICD-10-CM | POA: Diagnosis not present

## 2014-01-21 DIAGNOSIS — N186 End stage renal disease: Secondary | ICD-10-CM | POA: Diagnosis not present

## 2014-01-21 DIAGNOSIS — Z79899 Other long term (current) drug therapy: Secondary | ICD-10-CM | POA: Diagnosis not present

## 2014-01-22 DIAGNOSIS — R7309 Other abnormal glucose: Secondary | ICD-10-CM | POA: Diagnosis not present

## 2014-01-22 DIAGNOSIS — N039 Chronic nephritic syndrome with unspecified morphologic changes: Secondary | ICD-10-CM | POA: Diagnosis not present

## 2014-01-22 DIAGNOSIS — D631 Anemia in chronic kidney disease: Secondary | ICD-10-CM | POA: Diagnosis not present

## 2014-01-22 DIAGNOSIS — D509 Iron deficiency anemia, unspecified: Secondary | ICD-10-CM | POA: Diagnosis not present

## 2014-01-22 DIAGNOSIS — N186 End stage renal disease: Secondary | ICD-10-CM | POA: Diagnosis not present

## 2014-01-22 DIAGNOSIS — Z79899 Other long term (current) drug therapy: Secondary | ICD-10-CM | POA: Diagnosis not present

## 2014-01-23 DIAGNOSIS — D509 Iron deficiency anemia, unspecified: Secondary | ICD-10-CM | POA: Diagnosis not present

## 2014-01-23 DIAGNOSIS — N039 Chronic nephritic syndrome with unspecified morphologic changes: Secondary | ICD-10-CM | POA: Diagnosis not present

## 2014-01-23 DIAGNOSIS — D631 Anemia in chronic kidney disease: Secondary | ICD-10-CM | POA: Diagnosis not present

## 2014-01-23 DIAGNOSIS — N186 End stage renal disease: Secondary | ICD-10-CM | POA: Diagnosis not present

## 2014-01-23 DIAGNOSIS — R7309 Other abnormal glucose: Secondary | ICD-10-CM | POA: Diagnosis not present

## 2014-01-23 DIAGNOSIS — Z79899 Other long term (current) drug therapy: Secondary | ICD-10-CM | POA: Diagnosis not present

## 2014-01-24 DIAGNOSIS — N186 End stage renal disease: Secondary | ICD-10-CM | POA: Diagnosis not present

## 2014-01-24 DIAGNOSIS — R7309 Other abnormal glucose: Secondary | ICD-10-CM | POA: Diagnosis not present

## 2014-01-24 DIAGNOSIS — Z79899 Other long term (current) drug therapy: Secondary | ICD-10-CM | POA: Diagnosis not present

## 2014-01-24 DIAGNOSIS — D631 Anemia in chronic kidney disease: Secondary | ICD-10-CM | POA: Diagnosis not present

## 2014-01-24 DIAGNOSIS — N039 Chronic nephritic syndrome with unspecified morphologic changes: Secondary | ICD-10-CM | POA: Diagnosis not present

## 2014-01-24 DIAGNOSIS — D509 Iron deficiency anemia, unspecified: Secondary | ICD-10-CM | POA: Diagnosis not present

## 2014-01-26 DIAGNOSIS — D509 Iron deficiency anemia, unspecified: Secondary | ICD-10-CM | POA: Diagnosis not present

## 2014-01-26 DIAGNOSIS — N186 End stage renal disease: Secondary | ICD-10-CM | POA: Diagnosis not present

## 2014-01-26 DIAGNOSIS — R7309 Other abnormal glucose: Secondary | ICD-10-CM | POA: Diagnosis not present

## 2014-01-26 DIAGNOSIS — Z79899 Other long term (current) drug therapy: Secondary | ICD-10-CM | POA: Diagnosis not present

## 2014-01-26 DIAGNOSIS — D631 Anemia in chronic kidney disease: Secondary | ICD-10-CM | POA: Diagnosis not present

## 2014-01-27 DIAGNOSIS — D509 Iron deficiency anemia, unspecified: Secondary | ICD-10-CM | POA: Diagnosis not present

## 2014-01-27 DIAGNOSIS — Z79899 Other long term (current) drug therapy: Secondary | ICD-10-CM | POA: Diagnosis not present

## 2014-01-27 DIAGNOSIS — N186 End stage renal disease: Secondary | ICD-10-CM | POA: Diagnosis not present

## 2014-01-27 DIAGNOSIS — R7309 Other abnormal glucose: Secondary | ICD-10-CM | POA: Diagnosis not present

## 2014-01-27 DIAGNOSIS — D631 Anemia in chronic kidney disease: Secondary | ICD-10-CM | POA: Diagnosis not present

## 2014-01-29 DIAGNOSIS — D631 Anemia in chronic kidney disease: Secondary | ICD-10-CM | POA: Diagnosis not present

## 2014-01-29 DIAGNOSIS — N186 End stage renal disease: Secondary | ICD-10-CM | POA: Diagnosis not present

## 2014-01-29 DIAGNOSIS — R7309 Other abnormal glucose: Secondary | ICD-10-CM | POA: Diagnosis not present

## 2014-01-29 DIAGNOSIS — D509 Iron deficiency anemia, unspecified: Secondary | ICD-10-CM | POA: Diagnosis not present

## 2014-01-29 DIAGNOSIS — Z79899 Other long term (current) drug therapy: Secondary | ICD-10-CM | POA: Diagnosis not present

## 2014-01-31 DIAGNOSIS — Z79899 Other long term (current) drug therapy: Secondary | ICD-10-CM | POA: Diagnosis not present

## 2014-01-31 DIAGNOSIS — N186 End stage renal disease: Secondary | ICD-10-CM | POA: Diagnosis not present

## 2014-01-31 DIAGNOSIS — R7309 Other abnormal glucose: Secondary | ICD-10-CM | POA: Diagnosis not present

## 2014-01-31 DIAGNOSIS — D631 Anemia in chronic kidney disease: Secondary | ICD-10-CM | POA: Diagnosis not present

## 2014-01-31 DIAGNOSIS — D509 Iron deficiency anemia, unspecified: Secondary | ICD-10-CM | POA: Diagnosis not present

## 2014-02-01 DIAGNOSIS — R7309 Other abnormal glucose: Secondary | ICD-10-CM | POA: Diagnosis not present

## 2014-02-01 DIAGNOSIS — N039 Chronic nephritic syndrome with unspecified morphologic changes: Secondary | ICD-10-CM | POA: Diagnosis not present

## 2014-02-01 DIAGNOSIS — N186 End stage renal disease: Secondary | ICD-10-CM | POA: Diagnosis not present

## 2014-02-01 DIAGNOSIS — Z79899 Other long term (current) drug therapy: Secondary | ICD-10-CM | POA: Diagnosis not present

## 2014-02-01 DIAGNOSIS — D631 Anemia in chronic kidney disease: Secondary | ICD-10-CM | POA: Diagnosis not present

## 2014-02-01 DIAGNOSIS — D509 Iron deficiency anemia, unspecified: Secondary | ICD-10-CM | POA: Diagnosis not present

## 2014-02-02 DIAGNOSIS — N039 Chronic nephritic syndrome with unspecified morphologic changes: Secondary | ICD-10-CM | POA: Diagnosis not present

## 2014-02-02 DIAGNOSIS — D631 Anemia in chronic kidney disease: Secondary | ICD-10-CM | POA: Diagnosis not present

## 2014-02-02 DIAGNOSIS — M109 Gout, unspecified: Secondary | ICD-10-CM | POA: Diagnosis not present

## 2014-02-02 DIAGNOSIS — D509 Iron deficiency anemia, unspecified: Secondary | ICD-10-CM | POA: Diagnosis not present

## 2014-02-02 DIAGNOSIS — Z79899 Other long term (current) drug therapy: Secondary | ICD-10-CM | POA: Diagnosis not present

## 2014-02-02 DIAGNOSIS — N186 End stage renal disease: Secondary | ICD-10-CM | POA: Diagnosis not present

## 2014-02-02 DIAGNOSIS — R7309 Other abnormal glucose: Secondary | ICD-10-CM | POA: Diagnosis not present

## 2014-02-04 DIAGNOSIS — R7309 Other abnormal glucose: Secondary | ICD-10-CM | POA: Diagnosis not present

## 2014-02-04 DIAGNOSIS — D509 Iron deficiency anemia, unspecified: Secondary | ICD-10-CM | POA: Diagnosis not present

## 2014-02-04 DIAGNOSIS — N186 End stage renal disease: Secondary | ICD-10-CM | POA: Diagnosis not present

## 2014-02-04 DIAGNOSIS — D631 Anemia in chronic kidney disease: Secondary | ICD-10-CM | POA: Diagnosis not present

## 2014-02-04 DIAGNOSIS — Z79899 Other long term (current) drug therapy: Secondary | ICD-10-CM | POA: Diagnosis not present

## 2014-02-07 DIAGNOSIS — D509 Iron deficiency anemia, unspecified: Secondary | ICD-10-CM | POA: Diagnosis not present

## 2014-02-07 DIAGNOSIS — D631 Anemia in chronic kidney disease: Secondary | ICD-10-CM | POA: Diagnosis not present

## 2014-02-07 DIAGNOSIS — N186 End stage renal disease: Secondary | ICD-10-CM | POA: Diagnosis not present

## 2014-02-07 DIAGNOSIS — R7309 Other abnormal glucose: Secondary | ICD-10-CM | POA: Diagnosis not present

## 2014-02-07 DIAGNOSIS — Z79899 Other long term (current) drug therapy: Secondary | ICD-10-CM | POA: Diagnosis not present

## 2014-02-09 DIAGNOSIS — J45909 Unspecified asthma, uncomplicated: Secondary | ICD-10-CM | POA: Insufficient documentation

## 2014-02-09 DIAGNOSIS — B20 Human immunodeficiency virus [HIV] disease: Secondary | ICD-10-CM | POA: Diagnosis not present

## 2014-02-09 DIAGNOSIS — Z01818 Encounter for other preprocedural examination: Secondary | ICD-10-CM | POA: Diagnosis not present

## 2014-02-09 DIAGNOSIS — N2581 Secondary hyperparathyroidism of renal origin: Secondary | ICD-10-CM | POA: Insufficient documentation

## 2014-02-09 DIAGNOSIS — B181 Chronic viral hepatitis B without delta-agent: Secondary | ICD-10-CM | POA: Diagnosis not present

## 2014-02-09 DIAGNOSIS — Z7682 Awaiting organ transplant status: Secondary | ICD-10-CM | POA: Diagnosis not present

## 2014-02-09 DIAGNOSIS — Z21 Asymptomatic human immunodeficiency virus [HIV] infection status: Secondary | ICD-10-CM | POA: Diagnosis not present

## 2014-02-11 DIAGNOSIS — D631 Anemia in chronic kidney disease: Secondary | ICD-10-CM | POA: Diagnosis not present

## 2014-02-11 DIAGNOSIS — N186 End stage renal disease: Secondary | ICD-10-CM | POA: Diagnosis not present

## 2014-02-11 DIAGNOSIS — R7309 Other abnormal glucose: Secondary | ICD-10-CM | POA: Diagnosis not present

## 2014-02-11 DIAGNOSIS — Z79899 Other long term (current) drug therapy: Secondary | ICD-10-CM | POA: Diagnosis not present

## 2014-02-11 DIAGNOSIS — D509 Iron deficiency anemia, unspecified: Secondary | ICD-10-CM | POA: Diagnosis not present

## 2014-02-13 DIAGNOSIS — D631 Anemia in chronic kidney disease: Secondary | ICD-10-CM | POA: Diagnosis not present

## 2014-02-13 DIAGNOSIS — R7309 Other abnormal glucose: Secondary | ICD-10-CM | POA: Diagnosis not present

## 2014-02-13 DIAGNOSIS — N186 End stage renal disease: Secondary | ICD-10-CM | POA: Diagnosis not present

## 2014-02-13 DIAGNOSIS — Z79899 Other long term (current) drug therapy: Secondary | ICD-10-CM | POA: Diagnosis not present

## 2014-02-13 DIAGNOSIS — D509 Iron deficiency anemia, unspecified: Secondary | ICD-10-CM | POA: Diagnosis not present

## 2014-02-15 DIAGNOSIS — Z79899 Other long term (current) drug therapy: Secondary | ICD-10-CM | POA: Diagnosis not present

## 2014-02-15 DIAGNOSIS — D631 Anemia in chronic kidney disease: Secondary | ICD-10-CM | POA: Diagnosis not present

## 2014-02-15 DIAGNOSIS — D509 Iron deficiency anemia, unspecified: Secondary | ICD-10-CM | POA: Diagnosis not present

## 2014-02-15 DIAGNOSIS — N186 End stage renal disease: Secondary | ICD-10-CM | POA: Diagnosis not present

## 2014-02-15 DIAGNOSIS — R7309 Other abnormal glucose: Secondary | ICD-10-CM | POA: Diagnosis not present

## 2014-02-17 DIAGNOSIS — N186 End stage renal disease: Secondary | ICD-10-CM | POA: Diagnosis not present

## 2014-02-17 DIAGNOSIS — D509 Iron deficiency anemia, unspecified: Secondary | ICD-10-CM | POA: Diagnosis not present

## 2014-02-17 DIAGNOSIS — D631 Anemia in chronic kidney disease: Secondary | ICD-10-CM | POA: Diagnosis not present

## 2014-02-17 DIAGNOSIS — R7309 Other abnormal glucose: Secondary | ICD-10-CM | POA: Diagnosis not present

## 2014-02-17 DIAGNOSIS — Z79899 Other long term (current) drug therapy: Secondary | ICD-10-CM | POA: Diagnosis not present

## 2014-02-18 DIAGNOSIS — N186 End stage renal disease: Secondary | ICD-10-CM | POA: Diagnosis not present

## 2014-02-19 DIAGNOSIS — Z79899 Other long term (current) drug therapy: Secondary | ICD-10-CM | POA: Diagnosis not present

## 2014-02-19 DIAGNOSIS — R7309 Other abnormal glucose: Secondary | ICD-10-CM | POA: Diagnosis not present

## 2014-02-19 DIAGNOSIS — N186 End stage renal disease: Secondary | ICD-10-CM | POA: Diagnosis not present

## 2014-02-19 DIAGNOSIS — D631 Anemia in chronic kidney disease: Secondary | ICD-10-CM | POA: Diagnosis not present

## 2014-02-19 DIAGNOSIS — D509 Iron deficiency anemia, unspecified: Secondary | ICD-10-CM | POA: Diagnosis not present

## 2014-03-07 DIAGNOSIS — M109 Gout, unspecified: Secondary | ICD-10-CM | POA: Diagnosis not present

## 2014-03-12 IMAGING — CT CT HEAD W/O CM
1 series · 16 of 30 positions shown, 20 images · non-contrast
Comparison: 06/02/2011.

CLINICAL DATA: Headache and hypertension.  HIV positive.  History
of seizures.  Hepatitis B.

CT HEAD WITHOUT CONTRAST
TECHNIQUE: Contiguous axial images were obtained from the base of
the skull through the vertex without contrast.

[Series 2: head routine 4.8 h37s · axial · 0.47mm/px · z∈[-137,+15]mm · 16 of 36 slices shown, 20 images]
[im 2/36  brain]
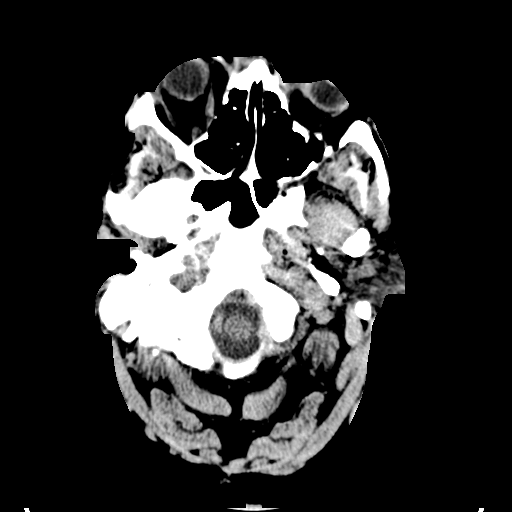
[im 2/36  bone]
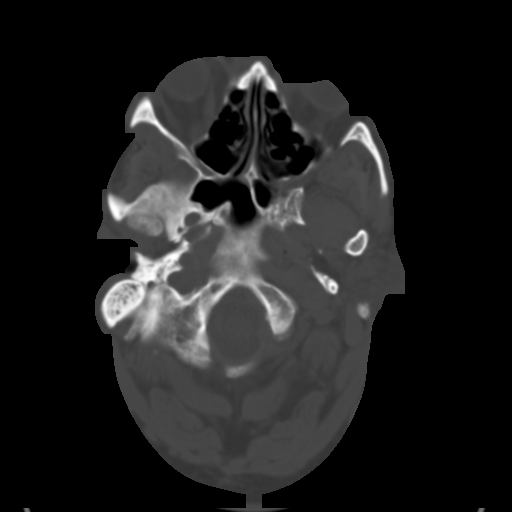
[im 4/36  brain]
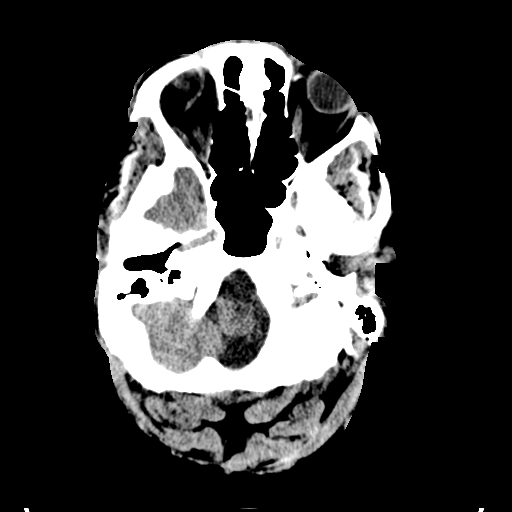
[im 7/36  brain]
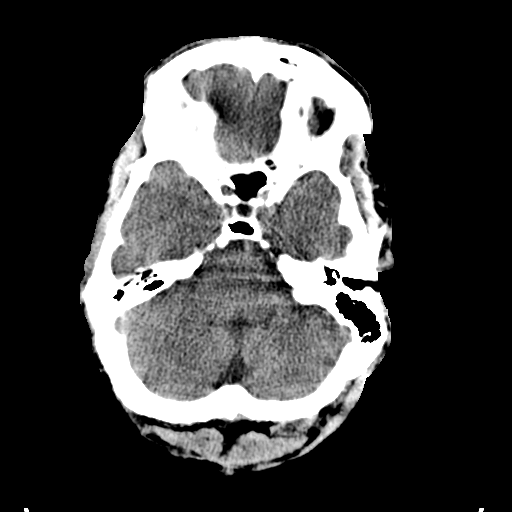
[im 9/36  brain]
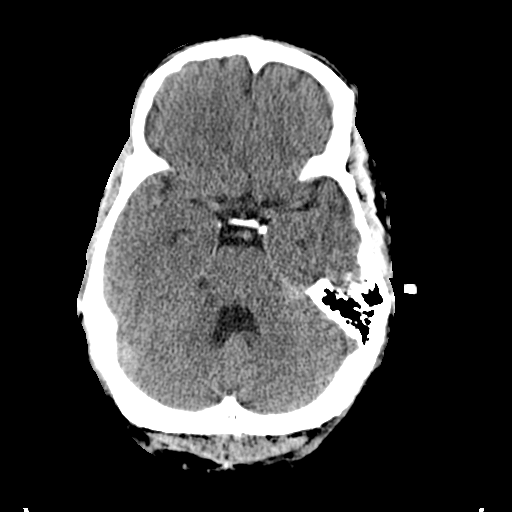
[im 10/36  brain]
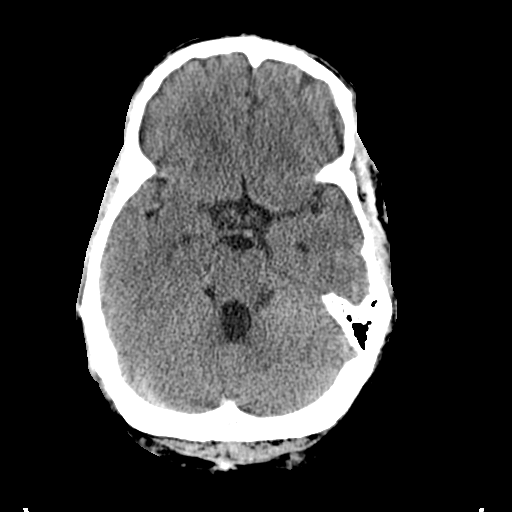
[im 10/36  bone]
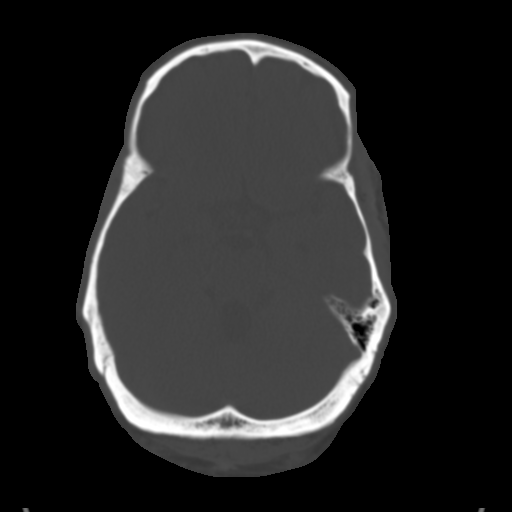
[im 13/36  brain]
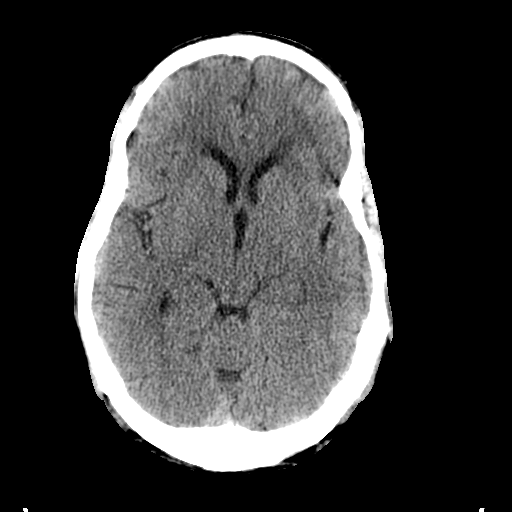
[im 15/36  brain]
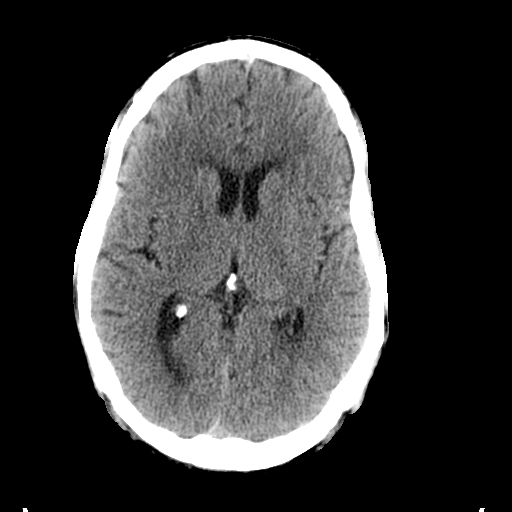
[im 17/36  brain]
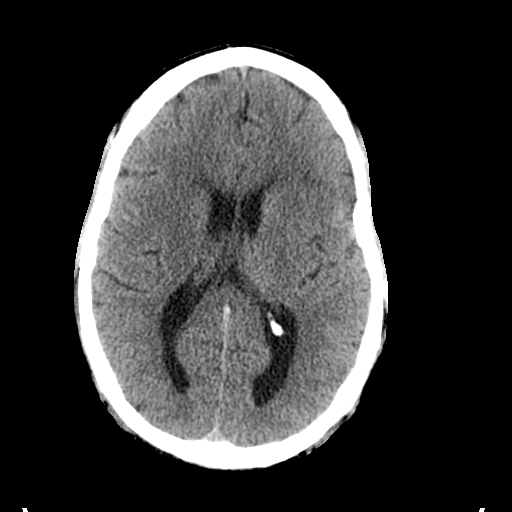
[im 19/36  brain]
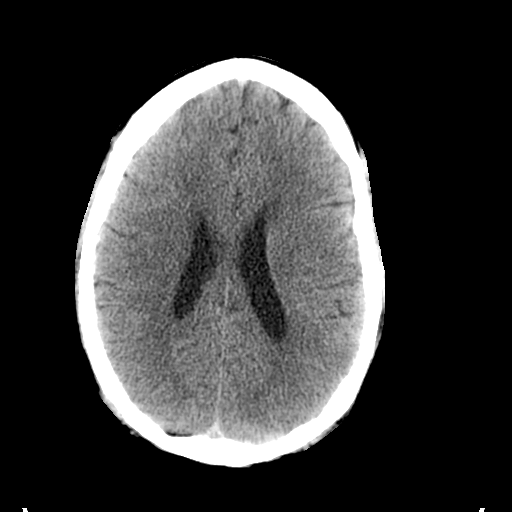
[im 19/36  bone]
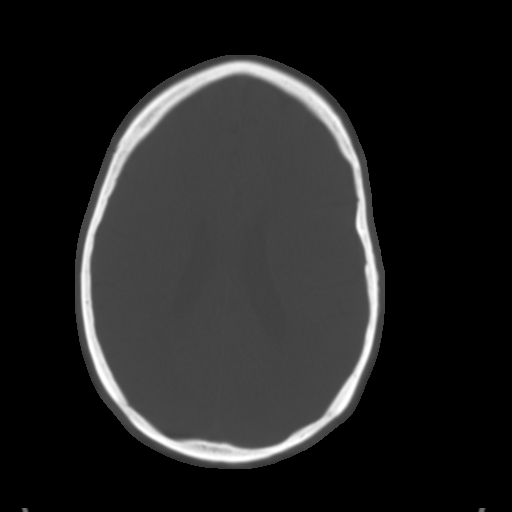
[im 21/36  brain]
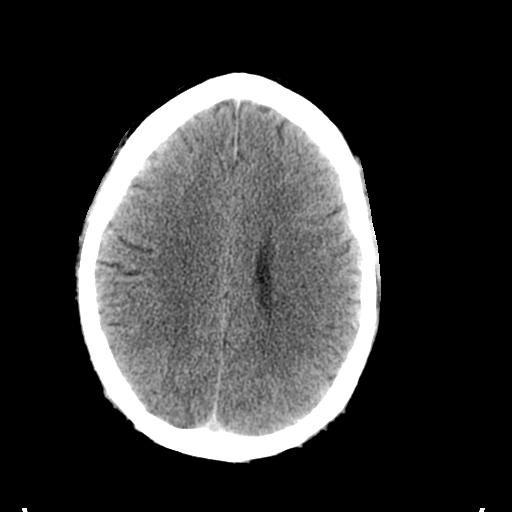
[im 23/36  brain]
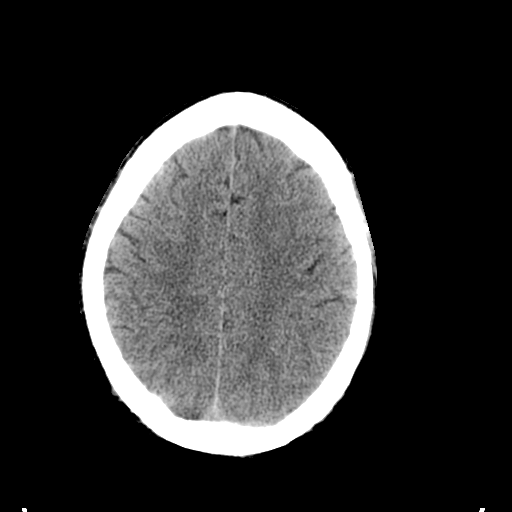
[im 26/36  brain]
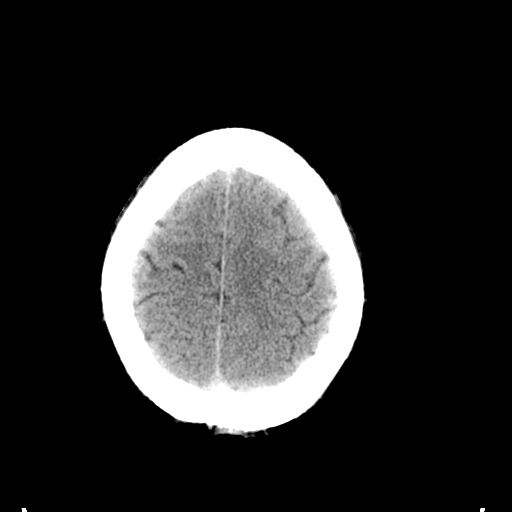
[im 27/36  brain]
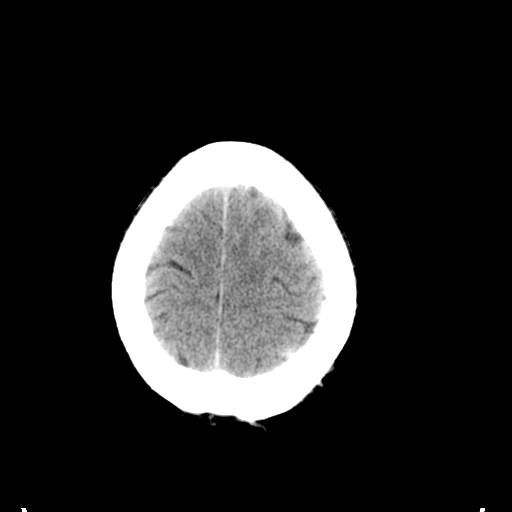
[im 27/36  bone]
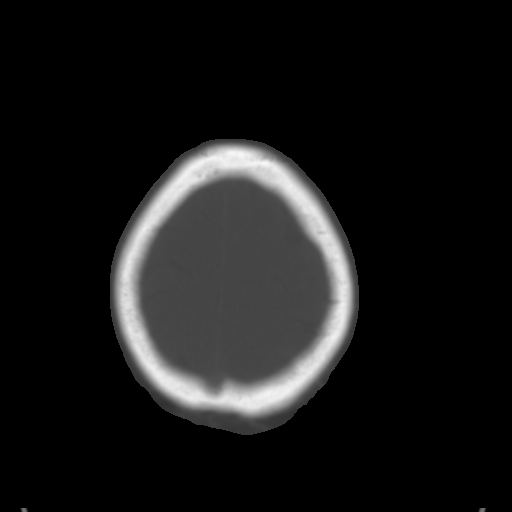
[im 29/36  brain]
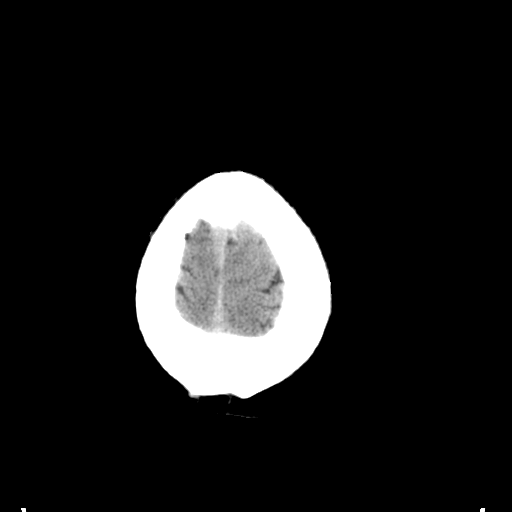
[im 32/36  brain]
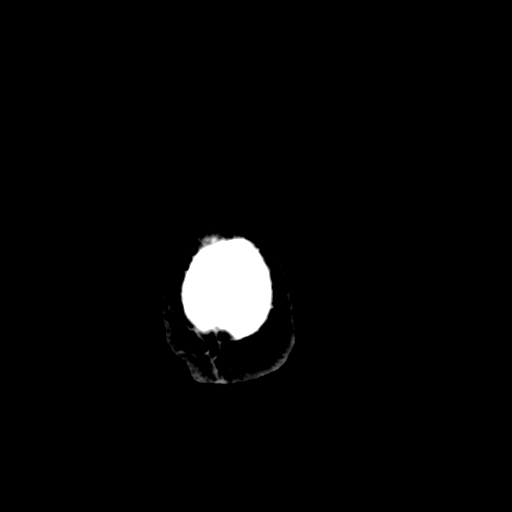
[im 34/36  brain]
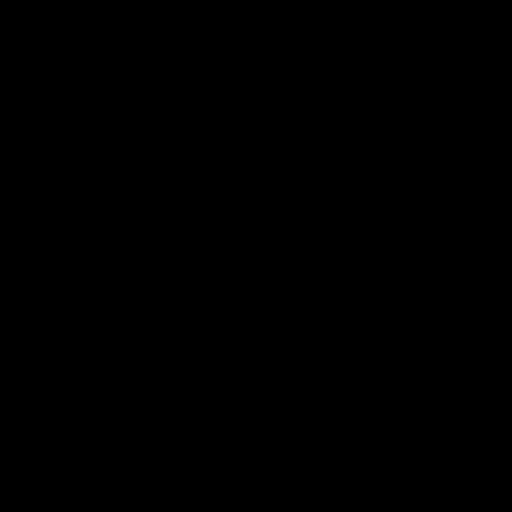

[16 of 30 positions shown; findings below may reference images not displayed]

FINDINGS: Bone windows demonstrate hypoplastic frontal sinuses.
Clear mastoid air cells.

Soft tissue windows demonstrate no  mass lesion, hemorrhage,
hydrocephalus, acute infarct, intra-axial, or extra-axial fluid
collection.
IMPRESSION: No acute intracranial abnormality.

## 2014-03-19 ENCOUNTER — Other Ambulatory Visit: Payer: Medicare Other

## 2014-03-20 DIAGNOSIS — N186 End stage renal disease: Secondary | ICD-10-CM | POA: Diagnosis not present

## 2014-03-21 DIAGNOSIS — N2581 Secondary hyperparathyroidism of renal origin: Secondary | ICD-10-CM | POA: Diagnosis not present

## 2014-03-21 DIAGNOSIS — N039 Chronic nephritic syndrome with unspecified morphologic changes: Secondary | ICD-10-CM | POA: Diagnosis not present

## 2014-03-21 DIAGNOSIS — D631 Anemia in chronic kidney disease: Secondary | ICD-10-CM | POA: Diagnosis not present

## 2014-03-21 DIAGNOSIS — R5381 Other malaise: Secondary | ICD-10-CM | POA: Diagnosis not present

## 2014-03-21 DIAGNOSIS — N2589 Other disorders resulting from impaired renal tubular function: Secondary | ICD-10-CM | POA: Diagnosis not present

## 2014-03-21 DIAGNOSIS — I953 Hypotension of hemodialysis: Secondary | ICD-10-CM | POA: Diagnosis not present

## 2014-03-21 DIAGNOSIS — I120 Hypertensive chronic kidney disease with stage 5 chronic kidney disease or end stage renal disease: Secondary | ICD-10-CM | POA: Diagnosis not present

## 2014-03-21 DIAGNOSIS — D509 Iron deficiency anemia, unspecified: Secondary | ICD-10-CM | POA: Diagnosis not present

## 2014-03-21 DIAGNOSIS — N186 End stage renal disease: Secondary | ICD-10-CM | POA: Diagnosis not present

## 2014-03-21 DIAGNOSIS — G7000 Myasthenia gravis without (acute) exacerbation: Secondary | ICD-10-CM | POA: Diagnosis not present

## 2014-04-04 ENCOUNTER — Ambulatory Visit: Payer: Medicare Other | Admitting: Infectious Diseases

## 2014-04-06 DIAGNOSIS — E785 Hyperlipidemia, unspecified: Secondary | ICD-10-CM | POA: Diagnosis not present

## 2014-04-06 DIAGNOSIS — D509 Iron deficiency anemia, unspecified: Secondary | ICD-10-CM | POA: Diagnosis not present

## 2014-04-15 ENCOUNTER — Encounter (HOSPITAL_COMMUNITY): Payer: Self-pay | Admitting: Emergency Medicine

## 2014-04-15 ENCOUNTER — Emergency Department (HOSPITAL_COMMUNITY)
Admission: EM | Admit: 2014-04-15 | Discharge: 2014-04-15 | Disposition: A | Discharge status: Home / Self Care | Payer: Medicare Other | Attending: Emergency Medicine | Admitting: Emergency Medicine

## 2014-04-15 DIAGNOSIS — J45909 Unspecified asthma, uncomplicated: Secondary | ICD-10-CM | POA: Insufficient documentation

## 2014-04-15 DIAGNOSIS — Z8659 Personal history of other mental and behavioral disorders: Secondary | ICD-10-CM | POA: Insufficient documentation

## 2014-04-15 DIAGNOSIS — I120 Hypertensive chronic kidney disease with stage 5 chronic kidney disease or end stage renal disease: Secondary | ICD-10-CM | POA: Diagnosis not present

## 2014-04-15 DIAGNOSIS — N186 End stage renal disease: Secondary | ICD-10-CM | POA: Insufficient documentation

## 2014-04-15 DIAGNOSIS — Z880 Allergy status to penicillin: Secondary | ICD-10-CM | POA: Diagnosis not present

## 2014-04-15 DIAGNOSIS — Z21 Asymptomatic human immunodeficiency virus [HIV] infection status: Secondary | ICD-10-CM | POA: Diagnosis not present

## 2014-04-15 DIAGNOSIS — Z8619 Personal history of other infectious and parasitic diseases: Secondary | ICD-10-CM | POA: Diagnosis not present

## 2014-04-15 DIAGNOSIS — Z862 Personal history of diseases of the blood and blood-forming organs and certain disorders involving the immune mechanism: Secondary | ICD-10-CM | POA: Insufficient documentation

## 2014-04-15 DIAGNOSIS — Z87891 Personal history of nicotine dependence: Secondary | ICD-10-CM | POA: Insufficient documentation

## 2014-04-15 DIAGNOSIS — Z992 Dependence on renal dialysis: Secondary | ICD-10-CM | POA: Insufficient documentation

## 2014-04-15 DIAGNOSIS — Z79899 Other long term (current) drug therapy: Secondary | ICD-10-CM | POA: Insufficient documentation

## 2014-04-15 DIAGNOSIS — I10 Essential (primary) hypertension: Secondary | ICD-10-CM

## 2014-04-15 DIAGNOSIS — Z8639 Personal history of other endocrine, nutritional and metabolic disease: Secondary | ICD-10-CM | POA: Diagnosis not present

## 2014-04-15 NOTE — ED Notes (Signed)
The pt is concerned because his BP is elevated. He is a hemodialysis pt and does his own dialysis at home. He received full dialysis treatment today. He states "i feel fine but normally my blood pressure is not this hgh so i just want to be checked."

## 2014-04-15 NOTE — ED Notes (Signed)
Attempted blood draw and was unsuccessful 

## 2014-04-15 NOTE — ED Provider Notes (Signed)
CSN: [id number]     Arrival date & time 04/15/14  1656 History   First MD Initiated Contact with Patient 04/15/14 1754     Chief Complaint  Patient presents with  . Hypertension     (Consider location/radiation/quality/duration/timing/severity/associated sxs/prior Treatment) HPI Comments: Finished 4 hour HD session at home - normal. BP was elevated afterwards - took hydralazine, labetalol and amlodipine. At home BP was 198/124. Pressures improving, Q000111Q systolic 123XX123 diastolic. Recently had medications changes - taken off labetalol and had hydralazine decreased.  Patient is a 58 y.o. male presenting with hypertension. The history is provided by the patient.  Hypertension This is a chronic problem. The current episode started 3 to 5 hours ago. The problem occurs constantly. The problem has been gradually improving. Pertinent negatives include no chest pain, no abdominal pain, no headaches and no shortness of breath. Nothing aggravates the symptoms. Nothing relieves the symptoms.    Past Medical History  Diagnosis Date  . Hypertension   . Gout   . Anemia   . Seizures 06/02/2011  . Thrombocytopenia   . Anxiety   . Hepatitis B carrier   . HIV infection   . Seizure   . Asthma     per pt hx  . End stage renal disease on home HD 07/10/2011    Started HD in September 2012 at [facility] with a tunneled HD catheter, now on home HD with NxtStage. Dialyzing through AVF L lower arm with buttonhole technique as of mid 2014. His brother does the HD treatments at home.  They are roommates for 23 years.  The brother works 3rd shift and gets off about 8am and then puts Mr Vannorman on HD in the morning after getting home. Most of the time he does HD about 4 times a week, for about 4 hours per treatment. Cause of ESRD was HTN according to patient. He says he let his health go and ending up with complications, and that he didn't like seeing doctors in those days.  He says he was diagnosed with severe HTN when he  lived in [place] in his 14's.    Past Surgical History  Procedure Laterality Date  . Av fistula placement  06/02/11    Left radiocephalic AVF  . Other surgical history      removal temporary HD catheter    Family History  Problem Relation Age of Onset  . Hypertension Mother   . Cancer Father    History  Substance Use Topics  . Smoking status: Former Smoker    Types: Cigarettes  . Smokeless tobacco: Never Used     Comment: not ready to quit yet; 1 pack lasts 2 months.  . Alcohol Use: No    Review of Systems  Constitutional: Negative for fever and chills.  Respiratory: Negative for shortness of breath.   Cardiovascular: Negative for chest pain.  Gastrointestinal: Negative for abdominal pain.  Neurological: Negative for headaches.  All other systems reviewed and are negative.     Allergies  Lisinopril and Penicillins  Home Medications   Prior to Admission medications   Medication Sig Start Date End Date Taking? Authorizing Provider  amLODipine (NORVASC) 10 MG tablet Take 1 tablet (10 mg total) by mouth daily. 06/27/13   Hester Mates, MD  calcitRIOL (ROCALTROL) 0.25 MCG capsule Take 0.25 mcg by mouth 2 (two) times daily.    Historical Provider, MD  Calcium Acetate 667 MG TABS Take 1,334-2,001 mg by mouth 3 (three) times daily with  meals. Takes 3 capsules with meals and 2 capsules with snacks    Historical Provider, MD  cinacalcet (SENSIPAR) 30 MG tablet Take 30 mg by mouth daily.    Historical Provider, MD  COMBIVENT RESPIMAT 20-100 MCG/ACT AERS respimat INHALE 1 PUFF BY MOUTH EVERY 6 HOURS AS NEEDED FOR WHEEZING 09/13/13   Joni Reining, DO  EPIVIR HBV 5 MG/ML solution TAKE 5 MLS BY MOUTH DAILY    Campbell Riches, MD  ISENTRESS 400 MG tablet TAKE 1 TABLET BY MOUTH TWICE DAILY 07/27/13   Campbell Riches, MD  labetalol (NORMODYNE) 200 MG tablet Take 200 mg by mouth 2 (two) times daily.     Historical Provider, MD  multivitamin (RENA-VIT) TABS tablet Take 1 tablet by  mouth daily.      Historical Provider, MD  tenofovir (VIREAD) 300 MG tablet Take 1 tablet (300 mg total) by mouth every 7 (seven) days. After dialysis 08/28/13   Campbell Riches, MD  traZODone (DESYREL) 50 MG tablet Take 1 tablet (50 mg total) by mouth at bedtime. 04/05/13   Campbell Riches, MD   BP 174/117  Pulse 91  Temp(Src) 98.5 F (36.9 C) (Oral)  Resp 20  SpO2 99% Physical Exam  Nursing note and vitals reviewed. Constitutional: He is oriented to person, place, and time. He appears well-developed and well-nourished. No distress.  HENT:  Head: Normocephalic and atraumatic.  Mouth/Throat: Oropharynx is clear and moist. No oropharyngeal exudate.  Eyes: EOM are normal. Pupils are equal, round, and reactive to light.  Neck: Normal range of motion. Neck supple.  Cardiovascular: Normal rate and regular rhythm.  Exam reveals no friction rub.   No murmur heard. Pulmonary/Chest: Effort normal and breath sounds normal. No respiratory distress. He has no wheezes. He has no rales.  Abdominal: He exhibits no distension. There is no tenderness. There is no rebound.  Musculoskeletal: Normal range of motion. He exhibits no edema.  Fistula in L arm, good thrill, well-appearing  Neurological: He is alert and oriented to person, place, and time.  Skin: No rash noted. He is not diaphoretic.    ED Course  Procedures (including critical care time) Labs Review Labs Reviewed - No data to display  Imaging Review No results found.   EKG Interpretation None      MDM   Final diagnoses:  Essential hypertension    58 year old male with history of high blood pressure presents with high blood pressure. He finished dialysis session and his blood pressures elevated. He took 3 of his home medications for his blood pressure with some improvement when he got here with systolics in the Q000111Q. Pressures ranging from Q000111Q to [id number] systolic from 123XX123 to 0000000 diastolic. The patient is a medications  decrease, labetalol was stopped and had hydralazine cut in half. He denies any symptoms such as chest pain, headache, shortness of breath, vision changes, dizziness. He feels very well. With his chronic hypertension, I feel that his blood pressures are okay to go home since he is asymptomatic. Instructed to talk to his PCP about his recent medication changes for possible regimen change. Stable for discharge.    Osvaldo Shipper, MD 04/15/14 1900

## 2014-04-15 NOTE — ED Notes (Signed)
I gave the patient a cup of ice.

## 2014-04-15 NOTE — Discharge Instructions (Signed)

## 2014-04-20 DIAGNOSIS — R9389 Abnormal findings on diagnostic imaging of other specified body structures: Secondary | ICD-10-CM | POA: Diagnosis not present

## 2014-04-20 DIAGNOSIS — Z21 Asymptomatic human immunodeficiency virus [HIV] infection status: Secondary | ICD-10-CM | POA: Diagnosis not present

## 2014-04-20 DIAGNOSIS — Z7682 Awaiting organ transplant status: Secondary | ICD-10-CM | POA: Diagnosis not present

## 2014-04-20 DIAGNOSIS — I708 Atherosclerosis of other arteries: Secondary | ICD-10-CM | POA: Diagnosis not present

## 2014-04-20 DIAGNOSIS — I517 Cardiomegaly: Secondary | ICD-10-CM | POA: Diagnosis not present

## 2014-04-20 DIAGNOSIS — B181 Chronic viral hepatitis B without delta-agent: Secondary | ICD-10-CM | POA: Diagnosis not present

## 2014-04-20 DIAGNOSIS — Z01818 Encounter for other preprocedural examination: Secondary | ICD-10-CM | POA: Diagnosis not present

## 2014-04-20 DIAGNOSIS — N186 End stage renal disease: Secondary | ICD-10-CM | POA: Diagnosis not present

## 2014-04-20 DIAGNOSIS — I120 Hypertensive chronic kidney disease with stage 5 chronic kidney disease or end stage renal disease: Secondary | ICD-10-CM | POA: Diagnosis not present

## 2014-04-20 DIAGNOSIS — Z79899 Other long term (current) drug therapy: Secondary | ICD-10-CM | POA: Diagnosis not present

## 2014-04-20 DIAGNOSIS — B20 Human immunodeficiency virus [HIV] disease: Secondary | ICD-10-CM | POA: Diagnosis not present

## 2014-04-20 DIAGNOSIS — Z01810 Encounter for preprocedural cardiovascular examination: Secondary | ICD-10-CM | POA: Diagnosis not present

## 2014-04-20 DIAGNOSIS — N289 Disorder of kidney and ureter, unspecified: Secondary | ICD-10-CM | POA: Diagnosis not present

## 2014-04-21 ENCOUNTER — Other Ambulatory Visit: Payer: Self-pay | Admitting: Infectious Diseases

## 2014-04-21 DIAGNOSIS — I120 Hypertensive chronic kidney disease with stage 5 chronic kidney disease or end stage renal disease: Secondary | ICD-10-CM | POA: Diagnosis not present

## 2014-04-21 DIAGNOSIS — G7000 Myasthenia gravis without (acute) exacerbation: Secondary | ICD-10-CM | POA: Diagnosis not present

## 2014-04-21 DIAGNOSIS — N186 End stage renal disease: Secondary | ICD-10-CM | POA: Diagnosis not present

## 2014-04-21 DIAGNOSIS — N2581 Secondary hyperparathyroidism of renal origin: Secondary | ICD-10-CM | POA: Diagnosis not present

## 2014-04-21 DIAGNOSIS — R5381 Other malaise: Secondary | ICD-10-CM | POA: Diagnosis not present

## 2014-04-21 DIAGNOSIS — D509 Iron deficiency anemia, unspecified: Secondary | ICD-10-CM | POA: Diagnosis not present

## 2014-04-21 DIAGNOSIS — I953 Hypotension of hemodialysis: Secondary | ICD-10-CM | POA: Diagnosis not present

## 2014-04-21 DIAGNOSIS — R5383 Other fatigue: Secondary | ICD-10-CM | POA: Diagnosis not present

## 2014-04-21 DIAGNOSIS — N2589 Other disorders resulting from impaired renal tubular function: Secondary | ICD-10-CM | POA: Diagnosis not present

## 2014-04-21 DIAGNOSIS — D631 Anemia in chronic kidney disease: Secondary | ICD-10-CM | POA: Diagnosis not present

## 2014-04-23 DIAGNOSIS — N186 End stage renal disease: Secondary | ICD-10-CM | POA: Diagnosis not present

## 2014-04-23 DIAGNOSIS — D631 Anemia in chronic kidney disease: Secondary | ICD-10-CM | POA: Diagnosis not present

## 2014-04-23 DIAGNOSIS — G7000 Myasthenia gravis without (acute) exacerbation: Secondary | ICD-10-CM | POA: Diagnosis not present

## 2014-04-23 DIAGNOSIS — N2589 Other disorders resulting from impaired renal tubular function: Secondary | ICD-10-CM | POA: Diagnosis not present

## 2014-04-23 DIAGNOSIS — N2581 Secondary hyperparathyroidism of renal origin: Secondary | ICD-10-CM | POA: Diagnosis not present

## 2014-04-23 DIAGNOSIS — D509 Iron deficiency anemia, unspecified: Secondary | ICD-10-CM | POA: Diagnosis not present

## 2014-04-25 DIAGNOSIS — R5381 Other malaise: Secondary | ICD-10-CM | POA: Diagnosis not present

## 2014-04-25 DIAGNOSIS — D631 Anemia in chronic kidney disease: Secondary | ICD-10-CM | POA: Diagnosis not present

## 2014-04-25 DIAGNOSIS — N186 End stage renal disease: Secondary | ICD-10-CM | POA: Diagnosis not present

## 2014-04-25 DIAGNOSIS — D509 Iron deficiency anemia, unspecified: Secondary | ICD-10-CM | POA: Diagnosis not present

## 2014-04-25 DIAGNOSIS — N2589 Other disorders resulting from impaired renal tubular function: Secondary | ICD-10-CM | POA: Diagnosis not present

## 2014-04-25 DIAGNOSIS — G7000 Myasthenia gravis without (acute) exacerbation: Secondary | ICD-10-CM | POA: Diagnosis not present

## 2014-04-25 DIAGNOSIS — R5383 Other fatigue: Secondary | ICD-10-CM | POA: Diagnosis not present

## 2014-04-25 DIAGNOSIS — N2581 Secondary hyperparathyroidism of renal origin: Secondary | ICD-10-CM | POA: Diagnosis not present

## 2014-04-25 DIAGNOSIS — N039 Chronic nephritic syndrome with unspecified morphologic changes: Secondary | ICD-10-CM | POA: Diagnosis not present

## 2014-04-26 DIAGNOSIS — N2581 Secondary hyperparathyroidism of renal origin: Secondary | ICD-10-CM | POA: Diagnosis not present

## 2014-04-26 DIAGNOSIS — N039 Chronic nephritic syndrome with unspecified morphologic changes: Secondary | ICD-10-CM | POA: Diagnosis not present

## 2014-04-26 DIAGNOSIS — D631 Anemia in chronic kidney disease: Secondary | ICD-10-CM | POA: Diagnosis not present

## 2014-04-26 DIAGNOSIS — D509 Iron deficiency anemia, unspecified: Secondary | ICD-10-CM | POA: Diagnosis not present

## 2014-04-26 DIAGNOSIS — G7000 Myasthenia gravis without (acute) exacerbation: Secondary | ICD-10-CM | POA: Diagnosis not present

## 2014-04-26 DIAGNOSIS — N186 End stage renal disease: Secondary | ICD-10-CM | POA: Diagnosis not present

## 2014-04-26 DIAGNOSIS — N2589 Other disorders resulting from impaired renal tubular function: Secondary | ICD-10-CM | POA: Diagnosis not present

## 2014-04-27 DIAGNOSIS — N2581 Secondary hyperparathyroidism of renal origin: Secondary | ICD-10-CM | POA: Diagnosis not present

## 2014-04-27 DIAGNOSIS — G7000 Myasthenia gravis without (acute) exacerbation: Secondary | ICD-10-CM | POA: Diagnosis not present

## 2014-04-27 DIAGNOSIS — D509 Iron deficiency anemia, unspecified: Secondary | ICD-10-CM | POA: Diagnosis not present

## 2014-04-27 DIAGNOSIS — D631 Anemia in chronic kidney disease: Secondary | ICD-10-CM | POA: Diagnosis not present

## 2014-04-27 DIAGNOSIS — N186 End stage renal disease: Secondary | ICD-10-CM | POA: Diagnosis not present

## 2014-04-27 DIAGNOSIS — E785 Hyperlipidemia, unspecified: Secondary | ICD-10-CM | POA: Diagnosis not present

## 2014-04-27 DIAGNOSIS — N2589 Other disorders resulting from impaired renal tubular function: Secondary | ICD-10-CM | POA: Diagnosis not present

## 2014-04-29 DIAGNOSIS — N186 End stage renal disease: Secondary | ICD-10-CM | POA: Diagnosis not present

## 2014-04-29 DIAGNOSIS — D509 Iron deficiency anemia, unspecified: Secondary | ICD-10-CM | POA: Diagnosis not present

## 2014-04-29 DIAGNOSIS — N2589 Other disorders resulting from impaired renal tubular function: Secondary | ICD-10-CM | POA: Diagnosis not present

## 2014-04-29 DIAGNOSIS — G7000 Myasthenia gravis without (acute) exacerbation: Secondary | ICD-10-CM | POA: Diagnosis not present

## 2014-04-29 DIAGNOSIS — N2581 Secondary hyperparathyroidism of renal origin: Secondary | ICD-10-CM | POA: Diagnosis not present

## 2014-04-29 DIAGNOSIS — D631 Anemia in chronic kidney disease: Secondary | ICD-10-CM | POA: Diagnosis not present

## 2014-04-29 DIAGNOSIS — N039 Chronic nephritic syndrome with unspecified morphologic changes: Secondary | ICD-10-CM | POA: Diagnosis not present

## 2014-04-30 ENCOUNTER — Other Ambulatory Visit: Payer: Medicare Other

## 2014-05-02 DIAGNOSIS — D509 Iron deficiency anemia, unspecified: Secondary | ICD-10-CM | POA: Diagnosis not present

## 2014-05-02 DIAGNOSIS — N2589 Other disorders resulting from impaired renal tubular function: Secondary | ICD-10-CM | POA: Diagnosis not present

## 2014-05-02 DIAGNOSIS — G7000 Myasthenia gravis without (acute) exacerbation: Secondary | ICD-10-CM | POA: Diagnosis not present

## 2014-05-02 DIAGNOSIS — N186 End stage renal disease: Secondary | ICD-10-CM | POA: Diagnosis not present

## 2014-05-02 DIAGNOSIS — D631 Anemia in chronic kidney disease: Secondary | ICD-10-CM | POA: Diagnosis not present

## 2014-05-02 DIAGNOSIS — N2581 Secondary hyperparathyroidism of renal origin: Secondary | ICD-10-CM | POA: Diagnosis not present

## 2014-05-03 DIAGNOSIS — N186 End stage renal disease: Secondary | ICD-10-CM | POA: Diagnosis not present

## 2014-05-03 DIAGNOSIS — N039 Chronic nephritic syndrome with unspecified morphologic changes: Secondary | ICD-10-CM | POA: Diagnosis not present

## 2014-05-03 DIAGNOSIS — D631 Anemia in chronic kidney disease: Secondary | ICD-10-CM | POA: Diagnosis not present

## 2014-05-03 DIAGNOSIS — D509 Iron deficiency anemia, unspecified: Secondary | ICD-10-CM | POA: Diagnosis not present

## 2014-05-03 DIAGNOSIS — N2581 Secondary hyperparathyroidism of renal origin: Secondary | ICD-10-CM | POA: Diagnosis not present

## 2014-05-03 DIAGNOSIS — G7000 Myasthenia gravis without (acute) exacerbation: Secondary | ICD-10-CM | POA: Diagnosis not present

## 2014-05-03 DIAGNOSIS — N2589 Other disorders resulting from impaired renal tubular function: Secondary | ICD-10-CM | POA: Diagnosis not present

## 2014-05-05 DIAGNOSIS — N186 End stage renal disease: Secondary | ICD-10-CM | POA: Diagnosis not present

## 2014-05-05 DIAGNOSIS — N2581 Secondary hyperparathyroidism of renal origin: Secondary | ICD-10-CM | POA: Diagnosis not present

## 2014-05-05 DIAGNOSIS — D631 Anemia in chronic kidney disease: Secondary | ICD-10-CM | POA: Diagnosis not present

## 2014-05-05 DIAGNOSIS — D509 Iron deficiency anemia, unspecified: Secondary | ICD-10-CM | POA: Diagnosis not present

## 2014-05-05 DIAGNOSIS — N2589 Other disorders resulting from impaired renal tubular function: Secondary | ICD-10-CM | POA: Diagnosis not present

## 2014-05-05 DIAGNOSIS — G7000 Myasthenia gravis without (acute) exacerbation: Secondary | ICD-10-CM | POA: Diagnosis not present

## 2014-05-07 DIAGNOSIS — N2589 Other disorders resulting from impaired renal tubular function: Secondary | ICD-10-CM | POA: Diagnosis not present

## 2014-05-07 DIAGNOSIS — N2581 Secondary hyperparathyroidism of renal origin: Secondary | ICD-10-CM | POA: Diagnosis not present

## 2014-05-07 DIAGNOSIS — D509 Iron deficiency anemia, unspecified: Secondary | ICD-10-CM | POA: Diagnosis not present

## 2014-05-07 DIAGNOSIS — N186 End stage renal disease: Secondary | ICD-10-CM | POA: Diagnosis not present

## 2014-05-07 DIAGNOSIS — D631 Anemia in chronic kidney disease: Secondary | ICD-10-CM | POA: Diagnosis not present

## 2014-05-07 DIAGNOSIS — G7000 Myasthenia gravis without (acute) exacerbation: Secondary | ICD-10-CM | POA: Diagnosis not present

## 2014-05-10 DIAGNOSIS — D509 Iron deficiency anemia, unspecified: Secondary | ICD-10-CM | POA: Diagnosis not present

## 2014-05-10 DIAGNOSIS — N2589 Other disorders resulting from impaired renal tubular function: Secondary | ICD-10-CM | POA: Diagnosis not present

## 2014-05-10 DIAGNOSIS — N2581 Secondary hyperparathyroidism of renal origin: Secondary | ICD-10-CM | POA: Diagnosis not present

## 2014-05-10 DIAGNOSIS — G7000 Myasthenia gravis without (acute) exacerbation: Secondary | ICD-10-CM | POA: Diagnosis not present

## 2014-05-10 DIAGNOSIS — D631 Anemia in chronic kidney disease: Secondary | ICD-10-CM | POA: Diagnosis not present

## 2014-05-10 DIAGNOSIS — N186 End stage renal disease: Secondary | ICD-10-CM | POA: Diagnosis not present

## 2014-05-12 DIAGNOSIS — N039 Chronic nephritic syndrome with unspecified morphologic changes: Secondary | ICD-10-CM | POA: Diagnosis not present

## 2014-05-12 DIAGNOSIS — D631 Anemia in chronic kidney disease: Secondary | ICD-10-CM | POA: Diagnosis not present

## 2014-05-12 DIAGNOSIS — G7000 Myasthenia gravis without (acute) exacerbation: Secondary | ICD-10-CM | POA: Diagnosis not present

## 2014-05-12 DIAGNOSIS — N2589 Other disorders resulting from impaired renal tubular function: Secondary | ICD-10-CM | POA: Diagnosis not present

## 2014-05-12 DIAGNOSIS — N2581 Secondary hyperparathyroidism of renal origin: Secondary | ICD-10-CM | POA: Diagnosis not present

## 2014-05-12 DIAGNOSIS — D509 Iron deficiency anemia, unspecified: Secondary | ICD-10-CM | POA: Diagnosis not present

## 2014-05-12 DIAGNOSIS — N186 End stage renal disease: Secondary | ICD-10-CM | POA: Diagnosis not present

## 2014-05-13 DIAGNOSIS — N186 End stage renal disease: Secondary | ICD-10-CM | POA: Diagnosis not present

## 2014-05-13 DIAGNOSIS — G7000 Myasthenia gravis without (acute) exacerbation: Secondary | ICD-10-CM | POA: Diagnosis not present

## 2014-05-13 DIAGNOSIS — D509 Iron deficiency anemia, unspecified: Secondary | ICD-10-CM | POA: Diagnosis not present

## 2014-05-13 DIAGNOSIS — N2581 Secondary hyperparathyroidism of renal origin: Secondary | ICD-10-CM | POA: Diagnosis not present

## 2014-05-13 DIAGNOSIS — D631 Anemia in chronic kidney disease: Secondary | ICD-10-CM | POA: Diagnosis not present

## 2014-05-13 DIAGNOSIS — N2589 Other disorders resulting from impaired renal tubular function: Secondary | ICD-10-CM | POA: Diagnosis not present

## 2014-05-14 ENCOUNTER — Ambulatory Visit (INDEPENDENT_AMBULATORY_CARE_PROVIDER_SITE_OTHER): Payer: Medicare Other | Admitting: Infectious Diseases

## 2014-05-14 ENCOUNTER — Encounter: Payer: Self-pay | Admitting: Infectious Diseases

## 2014-05-14 VITALS — BP 138/93 | HR 73 | Temp 98.5°F | Ht 71.0 in | Wt 213.0 lb

## 2014-05-14 DIAGNOSIS — Z79899 Other long term (current) drug therapy: Secondary | ICD-10-CM

## 2014-05-14 DIAGNOSIS — Z0000 Encounter for general adult medical examination without abnormal findings: Secondary | ICD-10-CM | POA: Diagnosis not present

## 2014-05-14 DIAGNOSIS — B20 Human immunodeficiency virus [HIV] disease: Secondary | ICD-10-CM | POA: Diagnosis not present

## 2014-05-14 DIAGNOSIS — Z113 Encounter for screening for infections with a predominantly sexual mode of transmission: Secondary | ICD-10-CM | POA: Diagnosis not present

## 2014-05-14 DIAGNOSIS — F17200 Nicotine dependence, unspecified, uncomplicated: Secondary | ICD-10-CM | POA: Diagnosis not present

## 2014-05-14 DIAGNOSIS — N186 End stage renal disease: Secondary | ICD-10-CM

## 2014-05-14 DIAGNOSIS — Z720 Tobacco use: Secondary | ICD-10-CM

## 2014-05-14 DIAGNOSIS — Z23 Encounter for immunization: Secondary | ICD-10-CM

## 2014-05-14 NOTE — Progress Notes (Signed)
   Subjective:    Patient ID: BAYARD DEFUSCO, male    DOB: July 15, 1956, 58 y.o.   MRN: [id number]  HPI 58 yo M with HIV+, ESRD, HTN. On 3TC/ISN/TFV, norvasc.  "I'm doing great".  Has been eval for renal txp, not a good candidate due to no vascular access. No problems with HD. BP has been up and down. No problems with his access site. Had his blood work done at [facility]. CD4 458 (02-09-14) and HIV RNA <20 (04-20-14).   Has been constipated (on water restriction, HD). Wt up 26#.  Review of Systems  Constitutional: Negative for appetite change and unexpected weight change.  Gastrointestinal: Positive for constipation. Negative for diarrhea.  Neurological: Negative for dizziness and light-headedness.      Objective:   Physical Exam  Constitutional: He appears well-developed and well-nourished.  HENT:  Mouth/Throat: No oropharyngeal exudate.  Eyes: EOM are normal. Pupils are equal, round, and reactive to light.  Neck: Neck supple.  Cardiovascular: Normal rate, regular rhythm and normal heart sounds.   Pulmonary/Chest: Effort normal and breath sounds normal.  Abdominal: Soft. Bowel sounds are normal. He exhibits no distension. There is no tenderness.  Musculoskeletal:       Arms: Lymphadenopathy:    He has no cervical adenopathy.          Assessment & Plan:

## 2014-05-14 NOTE — Assessment & Plan Note (Signed)
He is doing well. Will continue his current ART. Will get him set up for colonoscopy. He is given condoms. He gets flu shot. No change to his ART. Will see him back in 6 months with labs prior.

## 2014-05-14 NOTE — Assessment & Plan Note (Signed)
Has < 1 cigarette/week.

## 2014-05-14 NOTE — Assessment & Plan Note (Signed)
Greatly appreciate HD f/u. His wt is increasing.

## 2014-05-15 DIAGNOSIS — D509 Iron deficiency anemia, unspecified: Secondary | ICD-10-CM | POA: Diagnosis not present

## 2014-05-15 DIAGNOSIS — G7000 Myasthenia gravis without (acute) exacerbation: Secondary | ICD-10-CM | POA: Diagnosis not present

## 2014-05-15 DIAGNOSIS — N2589 Other disorders resulting from impaired renal tubular function: Secondary | ICD-10-CM | POA: Diagnosis not present

## 2014-05-15 DIAGNOSIS — N186 End stage renal disease: Secondary | ICD-10-CM | POA: Diagnosis not present

## 2014-05-15 DIAGNOSIS — D631 Anemia in chronic kidney disease: Secondary | ICD-10-CM | POA: Diagnosis not present

## 2014-05-15 DIAGNOSIS — N2581 Secondary hyperparathyroidism of renal origin: Secondary | ICD-10-CM | POA: Diagnosis not present

## 2014-05-17 DIAGNOSIS — D631 Anemia in chronic kidney disease: Secondary | ICD-10-CM | POA: Diagnosis not present

## 2014-05-17 DIAGNOSIS — N2589 Other disorders resulting from impaired renal tubular function: Secondary | ICD-10-CM | POA: Diagnosis not present

## 2014-05-17 DIAGNOSIS — N186 End stage renal disease: Secondary | ICD-10-CM | POA: Diagnosis not present

## 2014-05-17 DIAGNOSIS — D509 Iron deficiency anemia, unspecified: Secondary | ICD-10-CM | POA: Diagnosis not present

## 2014-05-17 DIAGNOSIS — G7000 Myasthenia gravis without (acute) exacerbation: Secondary | ICD-10-CM | POA: Diagnosis not present

## 2014-05-17 DIAGNOSIS — N2581 Secondary hyperparathyroidism of renal origin: Secondary | ICD-10-CM | POA: Diagnosis not present

## 2014-05-19 DIAGNOSIS — N039 Chronic nephritic syndrome with unspecified morphologic changes: Secondary | ICD-10-CM | POA: Diagnosis not present

## 2014-05-19 DIAGNOSIS — D509 Iron deficiency anemia, unspecified: Secondary | ICD-10-CM | POA: Diagnosis not present

## 2014-05-19 DIAGNOSIS — N2581 Secondary hyperparathyroidism of renal origin: Secondary | ICD-10-CM | POA: Diagnosis not present

## 2014-05-19 DIAGNOSIS — N2589 Other disorders resulting from impaired renal tubular function: Secondary | ICD-10-CM | POA: Diagnosis not present

## 2014-05-19 DIAGNOSIS — D631 Anemia in chronic kidney disease: Secondary | ICD-10-CM | POA: Diagnosis not present

## 2014-05-19 DIAGNOSIS — N186 End stage renal disease: Secondary | ICD-10-CM | POA: Diagnosis not present

## 2014-05-19 DIAGNOSIS — G7000 Myasthenia gravis without (acute) exacerbation: Secondary | ICD-10-CM | POA: Diagnosis not present

## 2014-05-21 DIAGNOSIS — N2581 Secondary hyperparathyroidism of renal origin: Secondary | ICD-10-CM | POA: Diagnosis not present

## 2014-05-21 DIAGNOSIS — N186 End stage renal disease: Secondary | ICD-10-CM | POA: Diagnosis not present

## 2014-05-21 DIAGNOSIS — N2589 Other disorders resulting from impaired renal tubular function: Secondary | ICD-10-CM | POA: Diagnosis not present

## 2014-05-21 DIAGNOSIS — D509 Iron deficiency anemia, unspecified: Secondary | ICD-10-CM | POA: Diagnosis not present

## 2014-05-21 DIAGNOSIS — G7000 Myasthenia gravis without (acute) exacerbation: Secondary | ICD-10-CM | POA: Diagnosis not present

## 2014-05-21 DIAGNOSIS — D631 Anemia in chronic kidney disease: Secondary | ICD-10-CM | POA: Diagnosis not present

## 2014-05-23 DIAGNOSIS — I953 Hypotension of hemodialysis: Secondary | ICD-10-CM | POA: Diagnosis not present

## 2014-05-23 DIAGNOSIS — I120 Hypertensive chronic kidney disease with stage 5 chronic kidney disease or end stage renal disease: Secondary | ICD-10-CM | POA: Diagnosis not present

## 2014-05-23 DIAGNOSIS — G7000 Myasthenia gravis without (acute) exacerbation: Secondary | ICD-10-CM | POA: Diagnosis not present

## 2014-05-23 DIAGNOSIS — R5381 Other malaise: Secondary | ICD-10-CM | POA: Diagnosis not present

## 2014-05-23 DIAGNOSIS — D631 Anemia in chronic kidney disease: Secondary | ICD-10-CM | POA: Diagnosis not present

## 2014-05-23 DIAGNOSIS — D509 Iron deficiency anemia, unspecified: Secondary | ICD-10-CM | POA: Diagnosis not present

## 2014-05-23 DIAGNOSIS — Z79899 Other long term (current) drug therapy: Secondary | ICD-10-CM | POA: Diagnosis not present

## 2014-05-23 DIAGNOSIS — N039 Chronic nephritic syndrome with unspecified morphologic changes: Secondary | ICD-10-CM | POA: Diagnosis not present

## 2014-05-23 DIAGNOSIS — N186 End stage renal disease: Secondary | ICD-10-CM | POA: Diagnosis not present

## 2014-05-24 DIAGNOSIS — D631 Anemia in chronic kidney disease: Secondary | ICD-10-CM | POA: Diagnosis not present

## 2014-05-24 DIAGNOSIS — N186 End stage renal disease: Secondary | ICD-10-CM | POA: Diagnosis not present

## 2014-05-24 DIAGNOSIS — G7000 Myasthenia gravis without (acute) exacerbation: Secondary | ICD-10-CM | POA: Diagnosis not present

## 2014-05-24 DIAGNOSIS — Z79899 Other long term (current) drug therapy: Secondary | ICD-10-CM | POA: Diagnosis not present

## 2014-05-24 DIAGNOSIS — I120 Hypertensive chronic kidney disease with stage 5 chronic kidney disease or end stage renal disease: Secondary | ICD-10-CM | POA: Diagnosis not present

## 2014-05-24 DIAGNOSIS — D509 Iron deficiency anemia, unspecified: Secondary | ICD-10-CM | POA: Diagnosis not present

## 2014-05-26 DIAGNOSIS — G7000 Myasthenia gravis without (acute) exacerbation: Secondary | ICD-10-CM | POA: Diagnosis not present

## 2014-05-26 DIAGNOSIS — D631 Anemia in chronic kidney disease: Secondary | ICD-10-CM | POA: Diagnosis not present

## 2014-05-26 DIAGNOSIS — D509 Iron deficiency anemia, unspecified: Secondary | ICD-10-CM | POA: Diagnosis not present

## 2014-05-26 DIAGNOSIS — N186 End stage renal disease: Secondary | ICD-10-CM | POA: Diagnosis not present

## 2014-05-26 DIAGNOSIS — I120 Hypertensive chronic kidney disease with stage 5 chronic kidney disease or end stage renal disease: Secondary | ICD-10-CM | POA: Diagnosis not present

## 2014-05-26 DIAGNOSIS — Z79899 Other long term (current) drug therapy: Secondary | ICD-10-CM | POA: Diagnosis not present

## 2014-05-29 DIAGNOSIS — N186 End stage renal disease: Secondary | ICD-10-CM | POA: Diagnosis not present

## 2014-05-29 DIAGNOSIS — Z79899 Other long term (current) drug therapy: Secondary | ICD-10-CM | POA: Diagnosis not present

## 2014-05-29 DIAGNOSIS — I120 Hypertensive chronic kidney disease with stage 5 chronic kidney disease or end stage renal disease: Secondary | ICD-10-CM | POA: Diagnosis not present

## 2014-05-29 DIAGNOSIS — D631 Anemia in chronic kidney disease: Secondary | ICD-10-CM | POA: Diagnosis not present

## 2014-05-29 DIAGNOSIS — N039 Chronic nephritic syndrome with unspecified morphologic changes: Secondary | ICD-10-CM | POA: Diagnosis not present

## 2014-05-29 DIAGNOSIS — D509 Iron deficiency anemia, unspecified: Secondary | ICD-10-CM | POA: Diagnosis not present

## 2014-05-29 DIAGNOSIS — G7000 Myasthenia gravis without (acute) exacerbation: Secondary | ICD-10-CM | POA: Diagnosis not present

## 2014-05-30 DIAGNOSIS — D631 Anemia in chronic kidney disease: Secondary | ICD-10-CM | POA: Diagnosis not present

## 2014-05-30 DIAGNOSIS — I120 Hypertensive chronic kidney disease with stage 5 chronic kidney disease or end stage renal disease: Secondary | ICD-10-CM | POA: Diagnosis not present

## 2014-05-30 DIAGNOSIS — Z79899 Other long term (current) drug therapy: Secondary | ICD-10-CM | POA: Diagnosis not present

## 2014-05-30 DIAGNOSIS — D509 Iron deficiency anemia, unspecified: Secondary | ICD-10-CM | POA: Diagnosis not present

## 2014-05-30 DIAGNOSIS — N186 End stage renal disease: Secondary | ICD-10-CM | POA: Diagnosis not present

## 2014-05-30 DIAGNOSIS — G7000 Myasthenia gravis without (acute) exacerbation: Secondary | ICD-10-CM | POA: Diagnosis not present

## 2014-05-30 DIAGNOSIS — M109 Gout, unspecified: Secondary | ICD-10-CM | POA: Diagnosis not present

## 2014-05-31 DIAGNOSIS — G7000 Myasthenia gravis without (acute) exacerbation: Secondary | ICD-10-CM | POA: Diagnosis not present

## 2014-05-31 DIAGNOSIS — N186 End stage renal disease: Secondary | ICD-10-CM | POA: Diagnosis not present

## 2014-05-31 DIAGNOSIS — I120 Hypertensive chronic kidney disease with stage 5 chronic kidney disease or end stage renal disease: Secondary | ICD-10-CM | POA: Diagnosis not present

## 2014-05-31 DIAGNOSIS — N039 Chronic nephritic syndrome with unspecified morphologic changes: Secondary | ICD-10-CM | POA: Diagnosis not present

## 2014-05-31 DIAGNOSIS — D631 Anemia in chronic kidney disease: Secondary | ICD-10-CM | POA: Diagnosis not present

## 2014-05-31 DIAGNOSIS — Z79899 Other long term (current) drug therapy: Secondary | ICD-10-CM | POA: Diagnosis not present

## 2014-05-31 DIAGNOSIS — D509 Iron deficiency anemia, unspecified: Secondary | ICD-10-CM | POA: Diagnosis not present

## 2014-06-01 DIAGNOSIS — G7000 Myasthenia gravis without (acute) exacerbation: Secondary | ICD-10-CM | POA: Diagnosis not present

## 2014-06-01 DIAGNOSIS — I120 Hypertensive chronic kidney disease with stage 5 chronic kidney disease or end stage renal disease: Secondary | ICD-10-CM | POA: Diagnosis not present

## 2014-06-01 DIAGNOSIS — D509 Iron deficiency anemia, unspecified: Secondary | ICD-10-CM | POA: Diagnosis not present

## 2014-06-01 DIAGNOSIS — Z79899 Other long term (current) drug therapy: Secondary | ICD-10-CM | POA: Diagnosis not present

## 2014-06-01 DIAGNOSIS — N186 End stage renal disease: Secondary | ICD-10-CM | POA: Diagnosis not present

## 2014-06-01 DIAGNOSIS — D631 Anemia in chronic kidney disease: Secondary | ICD-10-CM | POA: Diagnosis not present

## 2014-06-03 DIAGNOSIS — I120 Hypertensive chronic kidney disease with stage 5 chronic kidney disease or end stage renal disease: Secondary | ICD-10-CM | POA: Diagnosis not present

## 2014-06-03 DIAGNOSIS — D509 Iron deficiency anemia, unspecified: Secondary | ICD-10-CM | POA: Diagnosis not present

## 2014-06-03 DIAGNOSIS — D631 Anemia in chronic kidney disease: Secondary | ICD-10-CM | POA: Diagnosis not present

## 2014-06-03 DIAGNOSIS — Z79899 Other long term (current) drug therapy: Secondary | ICD-10-CM | POA: Diagnosis not present

## 2014-06-03 DIAGNOSIS — G7000 Myasthenia gravis without (acute) exacerbation: Secondary | ICD-10-CM | POA: Diagnosis not present

## 2014-06-03 DIAGNOSIS — N186 End stage renal disease: Secondary | ICD-10-CM | POA: Diagnosis not present

## 2014-06-04 DIAGNOSIS — N039 Chronic nephritic syndrome with unspecified morphologic changes: Secondary | ICD-10-CM | POA: Diagnosis not present

## 2014-06-04 DIAGNOSIS — Z79899 Other long term (current) drug therapy: Secondary | ICD-10-CM | POA: Diagnosis not present

## 2014-06-04 DIAGNOSIS — D509 Iron deficiency anemia, unspecified: Secondary | ICD-10-CM | POA: Diagnosis not present

## 2014-06-04 DIAGNOSIS — I120 Hypertensive chronic kidney disease with stage 5 chronic kidney disease or end stage renal disease: Secondary | ICD-10-CM | POA: Diagnosis not present

## 2014-06-04 DIAGNOSIS — D631 Anemia in chronic kidney disease: Secondary | ICD-10-CM | POA: Diagnosis not present

## 2014-06-04 DIAGNOSIS — N186 End stage renal disease: Secondary | ICD-10-CM | POA: Diagnosis not present

## 2014-06-04 DIAGNOSIS — G7000 Myasthenia gravis without (acute) exacerbation: Secondary | ICD-10-CM | POA: Diagnosis not present

## 2014-06-05 DIAGNOSIS — N186 End stage renal disease: Secondary | ICD-10-CM | POA: Diagnosis not present

## 2014-06-05 DIAGNOSIS — D631 Anemia in chronic kidney disease: Secondary | ICD-10-CM | POA: Diagnosis not present

## 2014-06-05 DIAGNOSIS — D509 Iron deficiency anemia, unspecified: Secondary | ICD-10-CM | POA: Diagnosis not present

## 2014-06-05 DIAGNOSIS — G7000 Myasthenia gravis without (acute) exacerbation: Secondary | ICD-10-CM | POA: Diagnosis not present

## 2014-06-05 DIAGNOSIS — I120 Hypertensive chronic kidney disease with stage 5 chronic kidney disease or end stage renal disease: Secondary | ICD-10-CM | POA: Diagnosis not present

## 2014-06-05 DIAGNOSIS — Z79899 Other long term (current) drug therapy: Secondary | ICD-10-CM | POA: Diagnosis not present

## 2014-06-07 DIAGNOSIS — I120 Hypertensive chronic kidney disease with stage 5 chronic kidney disease or end stage renal disease: Secondary | ICD-10-CM | POA: Diagnosis not present

## 2014-06-07 DIAGNOSIS — D631 Anemia in chronic kidney disease: Secondary | ICD-10-CM | POA: Diagnosis not present

## 2014-06-07 DIAGNOSIS — N186 End stage renal disease: Secondary | ICD-10-CM | POA: Diagnosis not present

## 2014-06-07 DIAGNOSIS — D509 Iron deficiency anemia, unspecified: Secondary | ICD-10-CM | POA: Diagnosis not present

## 2014-06-07 DIAGNOSIS — Z79899 Other long term (current) drug therapy: Secondary | ICD-10-CM | POA: Diagnosis not present

## 2014-06-07 DIAGNOSIS — G7000 Myasthenia gravis without (acute) exacerbation: Secondary | ICD-10-CM | POA: Diagnosis not present

## 2014-06-10 DIAGNOSIS — Z79899 Other long term (current) drug therapy: Secondary | ICD-10-CM | POA: Diagnosis not present

## 2014-06-10 DIAGNOSIS — D631 Anemia in chronic kidney disease: Secondary | ICD-10-CM | POA: Diagnosis not present

## 2014-06-10 DIAGNOSIS — I120 Hypertensive chronic kidney disease with stage 5 chronic kidney disease or end stage renal disease: Secondary | ICD-10-CM | POA: Diagnosis not present

## 2014-06-10 DIAGNOSIS — N186 End stage renal disease: Secondary | ICD-10-CM | POA: Diagnosis not present

## 2014-06-10 DIAGNOSIS — D509 Iron deficiency anemia, unspecified: Secondary | ICD-10-CM | POA: Diagnosis not present

## 2014-06-10 DIAGNOSIS — G7000 Myasthenia gravis without (acute) exacerbation: Secondary | ICD-10-CM | POA: Diagnosis not present

## 2014-06-11 DIAGNOSIS — I120 Hypertensive chronic kidney disease with stage 5 chronic kidney disease or end stage renal disease: Secondary | ICD-10-CM | POA: Diagnosis not present

## 2014-06-11 DIAGNOSIS — N186 End stage renal disease: Secondary | ICD-10-CM | POA: Diagnosis not present

## 2014-06-11 DIAGNOSIS — G7000 Myasthenia gravis without (acute) exacerbation: Secondary | ICD-10-CM | POA: Diagnosis not present

## 2014-06-11 DIAGNOSIS — D509 Iron deficiency anemia, unspecified: Secondary | ICD-10-CM | POA: Diagnosis not present

## 2014-06-11 DIAGNOSIS — N039 Chronic nephritic syndrome with unspecified morphologic changes: Secondary | ICD-10-CM | POA: Diagnosis not present

## 2014-06-11 DIAGNOSIS — D631 Anemia in chronic kidney disease: Secondary | ICD-10-CM | POA: Diagnosis not present

## 2014-06-11 DIAGNOSIS — Z79899 Other long term (current) drug therapy: Secondary | ICD-10-CM | POA: Diagnosis not present

## 2014-06-14 DIAGNOSIS — D509 Iron deficiency anemia, unspecified: Secondary | ICD-10-CM | POA: Diagnosis not present

## 2014-06-14 DIAGNOSIS — Z79899 Other long term (current) drug therapy: Secondary | ICD-10-CM | POA: Diagnosis not present

## 2014-06-14 DIAGNOSIS — D631 Anemia in chronic kidney disease: Secondary | ICD-10-CM | POA: Diagnosis not present

## 2014-06-14 DIAGNOSIS — G7000 Myasthenia gravis without (acute) exacerbation: Secondary | ICD-10-CM | POA: Diagnosis not present

## 2014-06-14 DIAGNOSIS — I120 Hypertensive chronic kidney disease with stage 5 chronic kidney disease or end stage renal disease: Secondary | ICD-10-CM | POA: Diagnosis not present

## 2014-06-14 DIAGNOSIS — N186 End stage renal disease: Secondary | ICD-10-CM | POA: Diagnosis not present

## 2014-06-14 IMAGING — CR DG CHEST 2V
2 series · 2 of 2 positions shown · non-contrast
Comparison: 08/27/2011

CLINICAL DATA: Shortness of breath, dialysis dependent,
hypertension, HIV

CHEST - 2 VIEW

[w chest pa]
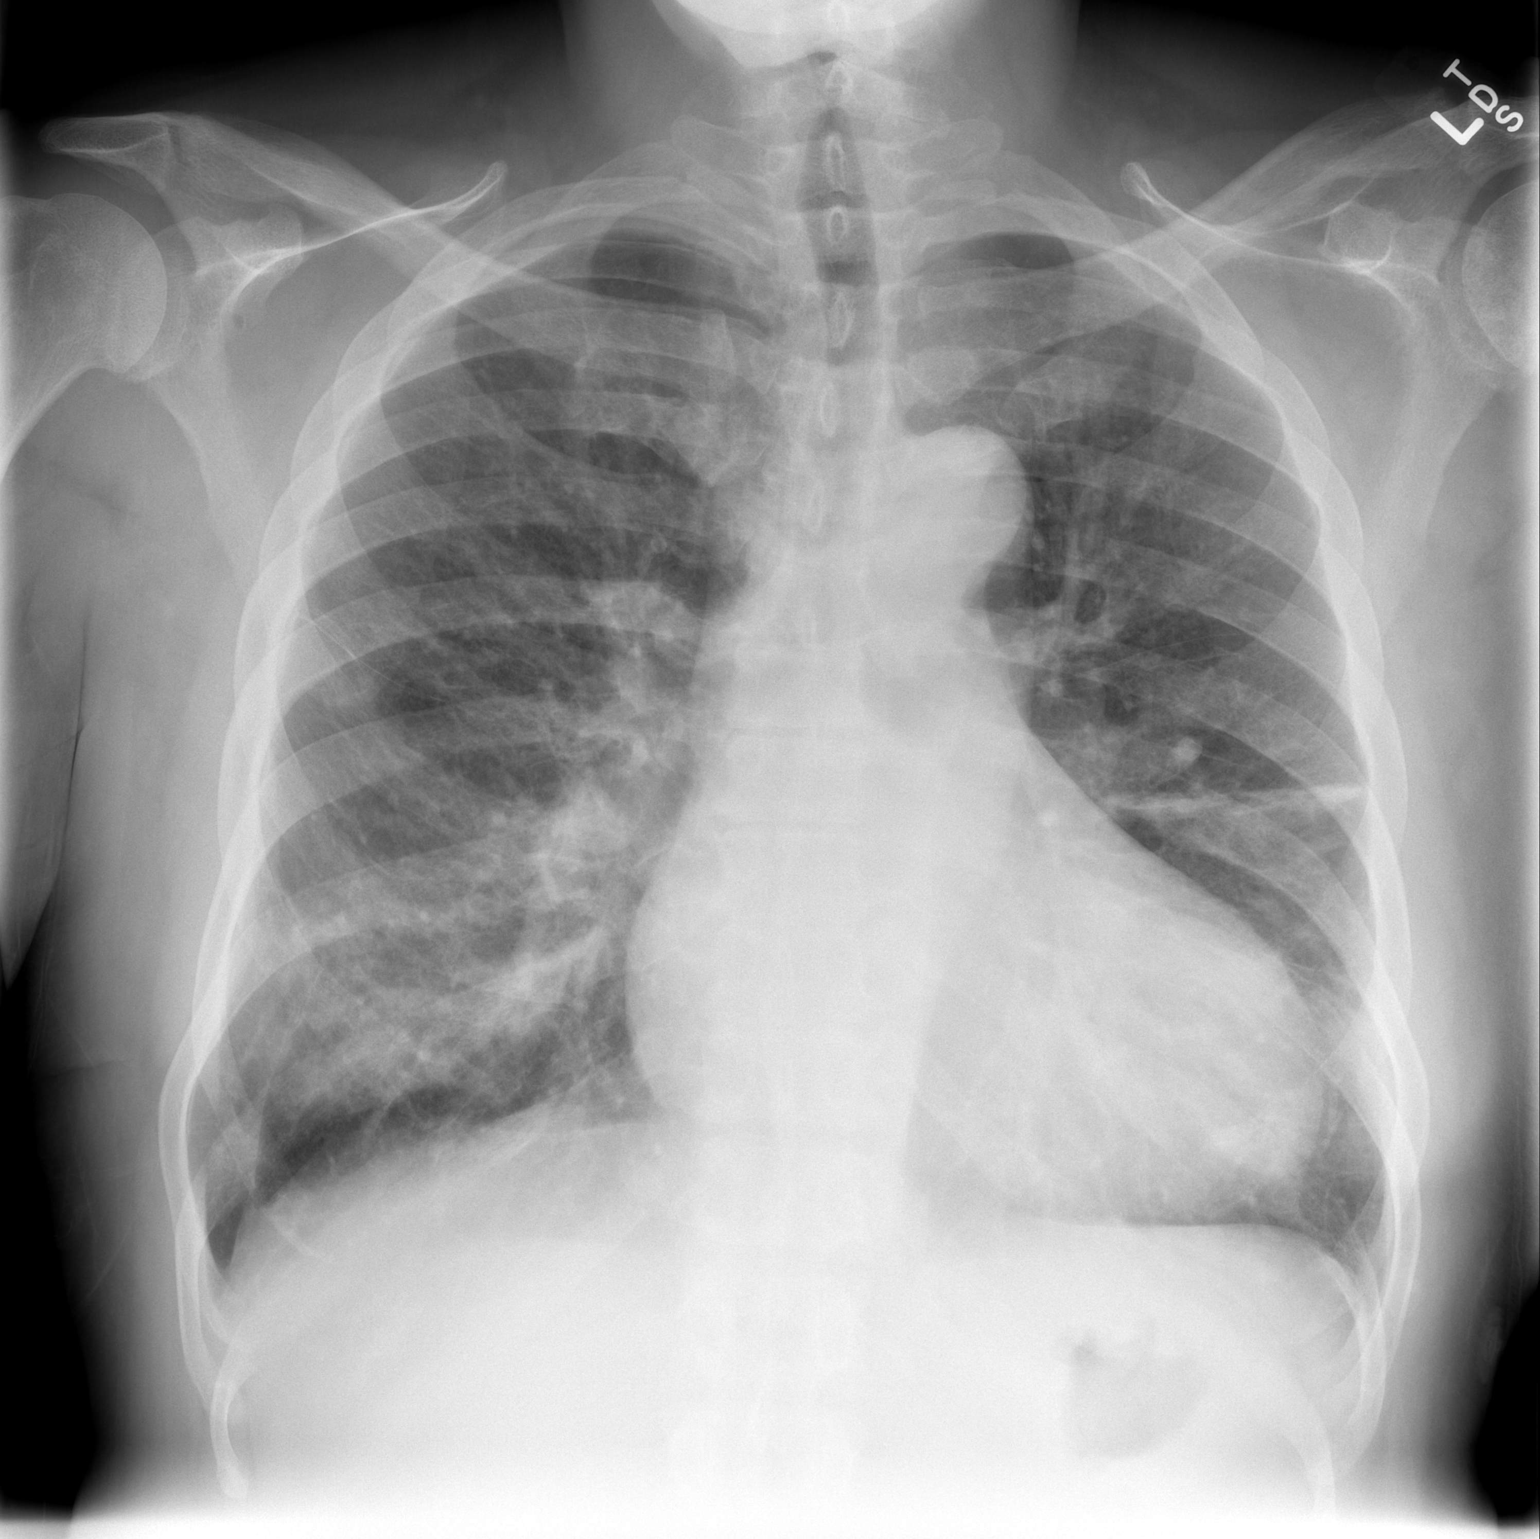

[w chest lat]
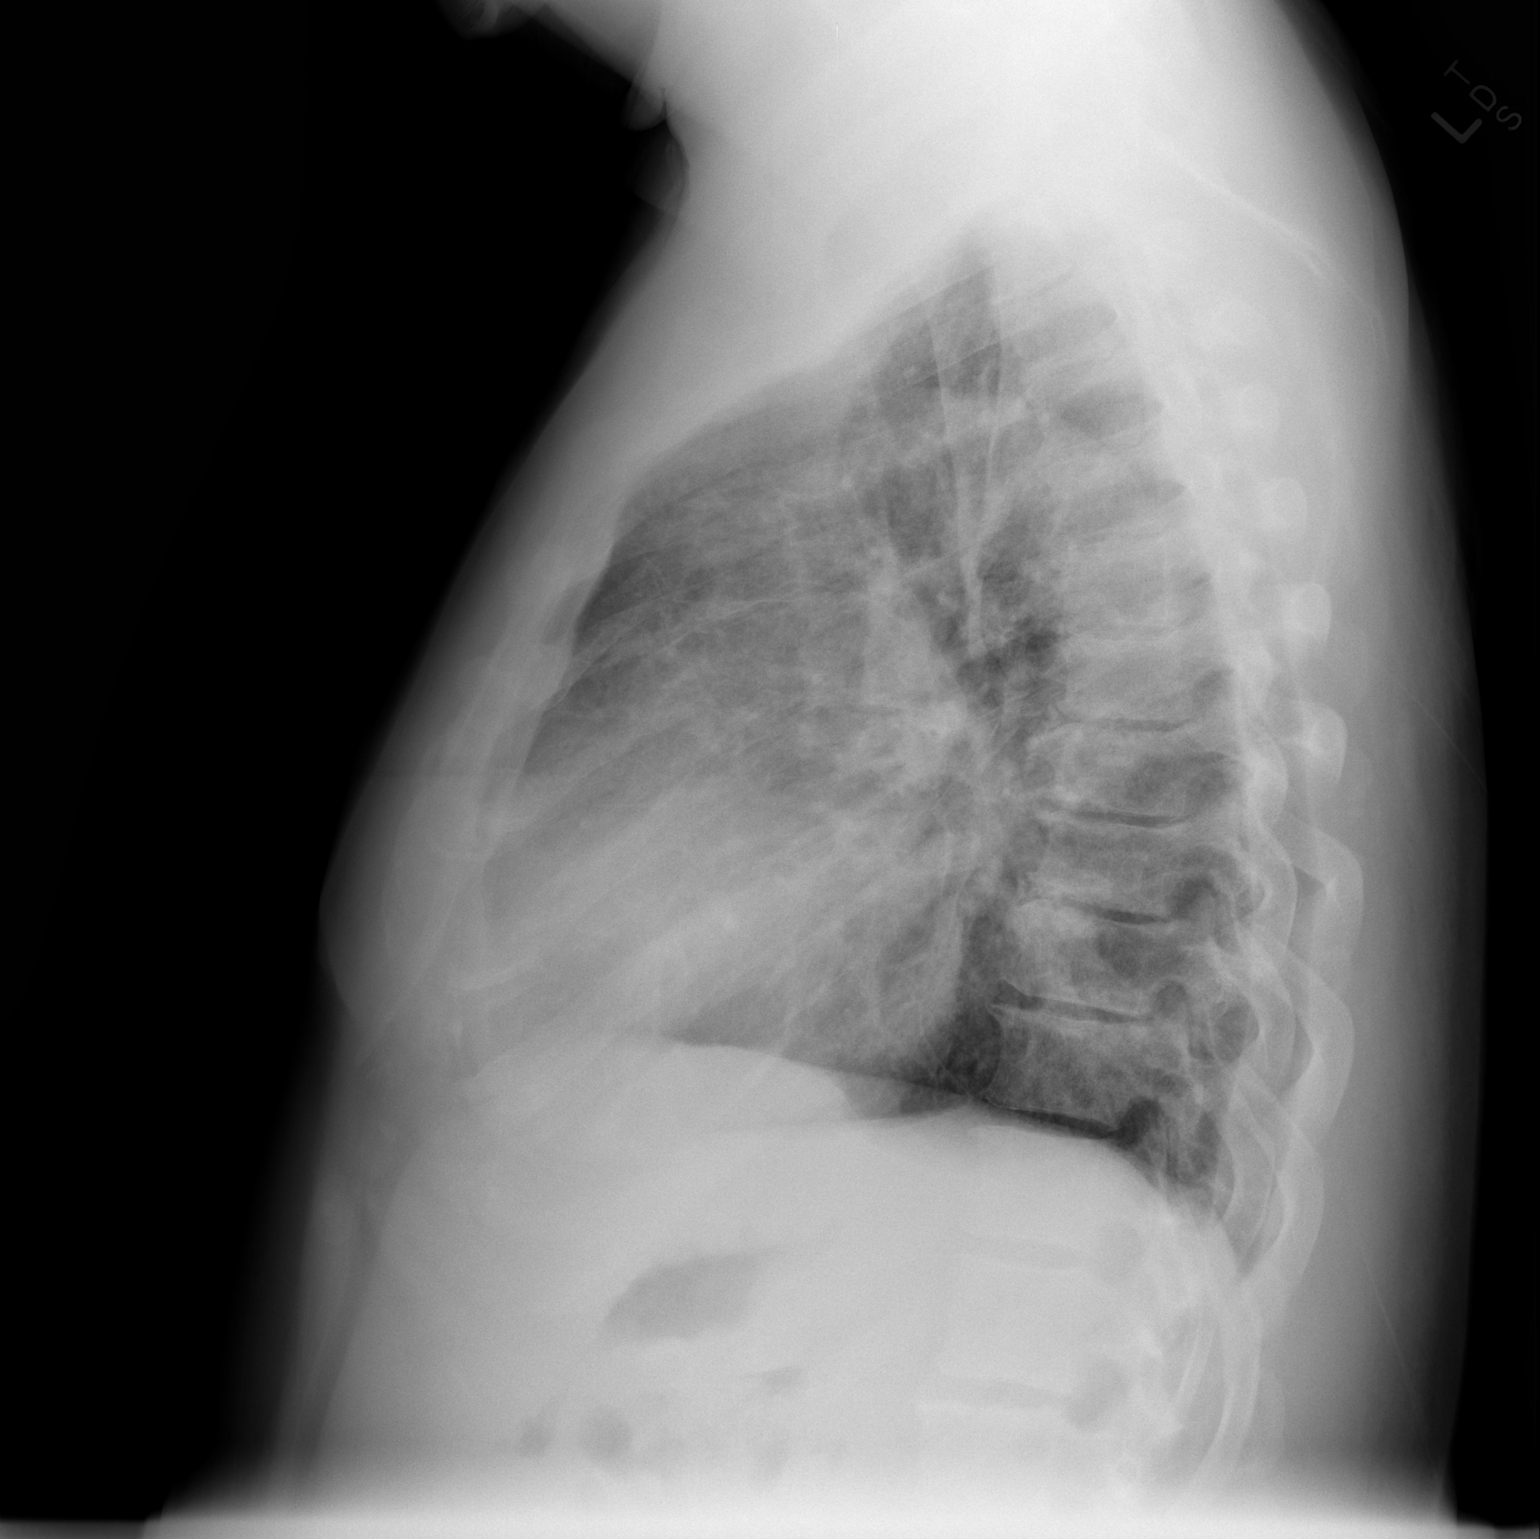

[2 of 2 positions shown; findings below may reference images not displayed]

FINDINGS: Interval removal of the right IJ dialysis catheter.
Worsening cardiomegaly with diffuse vascular and interstitial
prominence throughout the mid and lower lungs, suspect mild CHF or
volume overload.  Difficult to exclude superimposed infection or
pneumonia.  Streaky left midlung and bibasilar atelectasis.  No
significant effusion or pneumothorax.
IMPRESSION: Worsening cardiomegaly with vascular and interstitial changes,
suspect mild CHF or volume overload

Basilar atelectasis

## 2014-06-15 DIAGNOSIS — I120 Hypertensive chronic kidney disease with stage 5 chronic kidney disease or end stage renal disease: Secondary | ICD-10-CM | POA: Diagnosis not present

## 2014-06-15 DIAGNOSIS — D631 Anemia in chronic kidney disease: Secondary | ICD-10-CM | POA: Diagnosis not present

## 2014-06-15 DIAGNOSIS — Z79899 Other long term (current) drug therapy: Secondary | ICD-10-CM | POA: Diagnosis not present

## 2014-06-15 DIAGNOSIS — G7000 Myasthenia gravis without (acute) exacerbation: Secondary | ICD-10-CM | POA: Diagnosis not present

## 2014-06-15 DIAGNOSIS — N186 End stage renal disease: Secondary | ICD-10-CM | POA: Diagnosis not present

## 2014-06-15 DIAGNOSIS — D509 Iron deficiency anemia, unspecified: Secondary | ICD-10-CM | POA: Diagnosis not present

## 2014-06-17 DIAGNOSIS — I120 Hypertensive chronic kidney disease with stage 5 chronic kidney disease or end stage renal disease: Secondary | ICD-10-CM | POA: Diagnosis not present

## 2014-06-17 DIAGNOSIS — N186 End stage renal disease: Secondary | ICD-10-CM | POA: Diagnosis not present

## 2014-06-17 DIAGNOSIS — G7000 Myasthenia gravis without (acute) exacerbation: Secondary | ICD-10-CM | POA: Diagnosis not present

## 2014-06-17 DIAGNOSIS — D509 Iron deficiency anemia, unspecified: Secondary | ICD-10-CM | POA: Diagnosis not present

## 2014-06-17 DIAGNOSIS — D631 Anemia in chronic kidney disease: Secondary | ICD-10-CM | POA: Diagnosis not present

## 2014-06-17 DIAGNOSIS — Z79899 Other long term (current) drug therapy: Secondary | ICD-10-CM | POA: Diagnosis not present

## 2014-06-18 ENCOUNTER — Encounter: Payer: Self-pay | Admitting: Infectious Diseases

## 2014-06-19 DIAGNOSIS — D509 Iron deficiency anemia, unspecified: Secondary | ICD-10-CM | POA: Diagnosis not present

## 2014-06-19 DIAGNOSIS — D631 Anemia in chronic kidney disease: Secondary | ICD-10-CM | POA: Diagnosis not present

## 2014-06-19 DIAGNOSIS — I120 Hypertensive chronic kidney disease with stage 5 chronic kidney disease or end stage renal disease: Secondary | ICD-10-CM | POA: Diagnosis not present

## 2014-06-19 DIAGNOSIS — N186 End stage renal disease: Secondary | ICD-10-CM | POA: Diagnosis not present

## 2014-06-19 DIAGNOSIS — Z79899 Other long term (current) drug therapy: Secondary | ICD-10-CM | POA: Diagnosis not present

## 2014-06-19 DIAGNOSIS — G7000 Myasthenia gravis without (acute) exacerbation: Secondary | ICD-10-CM | POA: Diagnosis not present

## 2014-06-20 DIAGNOSIS — N186 End stage renal disease: Secondary | ICD-10-CM | POA: Diagnosis not present

## 2014-06-21 DIAGNOSIS — D631 Anemia in chronic kidney disease: Secondary | ICD-10-CM | POA: Diagnosis not present

## 2014-06-21 DIAGNOSIS — N2581 Secondary hyperparathyroidism of renal origin: Secondary | ICD-10-CM | POA: Diagnosis not present

## 2014-06-21 DIAGNOSIS — N186 End stage renal disease: Secondary | ICD-10-CM | POA: Diagnosis not present

## 2014-06-21 DIAGNOSIS — I953 Hypotension of hemodialysis: Secondary | ICD-10-CM | POA: Diagnosis not present

## 2014-06-21 DIAGNOSIS — I120 Hypertensive chronic kidney disease with stage 5 chronic kidney disease or end stage renal disease: Secondary | ICD-10-CM | POA: Diagnosis not present

## 2014-06-21 DIAGNOSIS — D509 Iron deficiency anemia, unspecified: Secondary | ICD-10-CM | POA: Diagnosis not present

## 2014-06-25 DIAGNOSIS — I120 Hypertensive chronic kidney disease with stage 5 chronic kidney disease or end stage renal disease: Secondary | ICD-10-CM | POA: Diagnosis not present

## 2014-06-25 DIAGNOSIS — D509 Iron deficiency anemia, unspecified: Secondary | ICD-10-CM | POA: Diagnosis not present

## 2014-06-25 DIAGNOSIS — N2581 Secondary hyperparathyroidism of renal origin: Secondary | ICD-10-CM | POA: Diagnosis not present

## 2014-06-25 DIAGNOSIS — I953 Hypotension of hemodialysis: Secondary | ICD-10-CM | POA: Diagnosis not present

## 2014-06-25 DIAGNOSIS — D631 Anemia in chronic kidney disease: Secondary | ICD-10-CM | POA: Diagnosis not present

## 2014-06-25 DIAGNOSIS — N186 End stage renal disease: Secondary | ICD-10-CM | POA: Diagnosis not present

## 2014-06-27 DIAGNOSIS — I953 Hypotension of hemodialysis: Secondary | ICD-10-CM | POA: Diagnosis not present

## 2014-06-27 DIAGNOSIS — I120 Hypertensive chronic kidney disease with stage 5 chronic kidney disease or end stage renal disease: Secondary | ICD-10-CM | POA: Diagnosis not present

## 2014-06-27 DIAGNOSIS — N2581 Secondary hyperparathyroidism of renal origin: Secondary | ICD-10-CM | POA: Diagnosis not present

## 2014-06-27 DIAGNOSIS — D631 Anemia in chronic kidney disease: Secondary | ICD-10-CM | POA: Diagnosis not present

## 2014-06-27 DIAGNOSIS — N186 End stage renal disease: Secondary | ICD-10-CM | POA: Diagnosis not present

## 2014-06-27 DIAGNOSIS — D509 Iron deficiency anemia, unspecified: Secondary | ICD-10-CM | POA: Diagnosis not present

## 2014-06-28 DIAGNOSIS — N2581 Secondary hyperparathyroidism of renal origin: Secondary | ICD-10-CM | POA: Diagnosis not present

## 2014-06-28 DIAGNOSIS — N186 End stage renal disease: Secondary | ICD-10-CM | POA: Diagnosis not present

## 2014-06-28 DIAGNOSIS — I953 Hypotension of hemodialysis: Secondary | ICD-10-CM | POA: Diagnosis not present

## 2014-06-28 DIAGNOSIS — E785 Hyperlipidemia, unspecified: Secondary | ICD-10-CM | POA: Diagnosis not present

## 2014-06-28 DIAGNOSIS — D509 Iron deficiency anemia, unspecified: Secondary | ICD-10-CM | POA: Diagnosis not present

## 2014-06-28 DIAGNOSIS — D631 Anemia in chronic kidney disease: Secondary | ICD-10-CM | POA: Diagnosis not present

## 2014-06-28 DIAGNOSIS — I120 Hypertensive chronic kidney disease with stage 5 chronic kidney disease or end stage renal disease: Secondary | ICD-10-CM | POA: Diagnosis not present

## 2014-06-30 DIAGNOSIS — I953 Hypotension of hemodialysis: Secondary | ICD-10-CM | POA: Diagnosis not present

## 2014-06-30 DIAGNOSIS — I120 Hypertensive chronic kidney disease with stage 5 chronic kidney disease or end stage renal disease: Secondary | ICD-10-CM | POA: Diagnosis not present

## 2014-06-30 DIAGNOSIS — D509 Iron deficiency anemia, unspecified: Secondary | ICD-10-CM | POA: Diagnosis not present

## 2014-06-30 DIAGNOSIS — N186 End stage renal disease: Secondary | ICD-10-CM | POA: Diagnosis not present

## 2014-06-30 DIAGNOSIS — N2581 Secondary hyperparathyroidism of renal origin: Secondary | ICD-10-CM | POA: Diagnosis not present

## 2014-06-30 DIAGNOSIS — D631 Anemia in chronic kidney disease: Secondary | ICD-10-CM | POA: Diagnosis not present

## 2014-07-02 DIAGNOSIS — N2581 Secondary hyperparathyroidism of renal origin: Secondary | ICD-10-CM | POA: Diagnosis not present

## 2014-07-02 DIAGNOSIS — D631 Anemia in chronic kidney disease: Secondary | ICD-10-CM | POA: Diagnosis not present

## 2014-07-02 DIAGNOSIS — I120 Hypertensive chronic kidney disease with stage 5 chronic kidney disease or end stage renal disease: Secondary | ICD-10-CM | POA: Diagnosis not present

## 2014-07-02 DIAGNOSIS — I953 Hypotension of hemodialysis: Secondary | ICD-10-CM | POA: Diagnosis not present

## 2014-07-02 DIAGNOSIS — D509 Iron deficiency anemia, unspecified: Secondary | ICD-10-CM | POA: Diagnosis not present

## 2014-07-02 DIAGNOSIS — N186 End stage renal disease: Secondary | ICD-10-CM | POA: Diagnosis not present

## 2014-07-03 IMAGING — CR DG CHEST 2V
2 series · 2 of 2 positions shown · non-contrast
Comparison: 12/20/2012

CLINICAL DATA: Shortness of breath.  Home dialysis patient.

CHEST - 2 VIEW

[w chest pa]
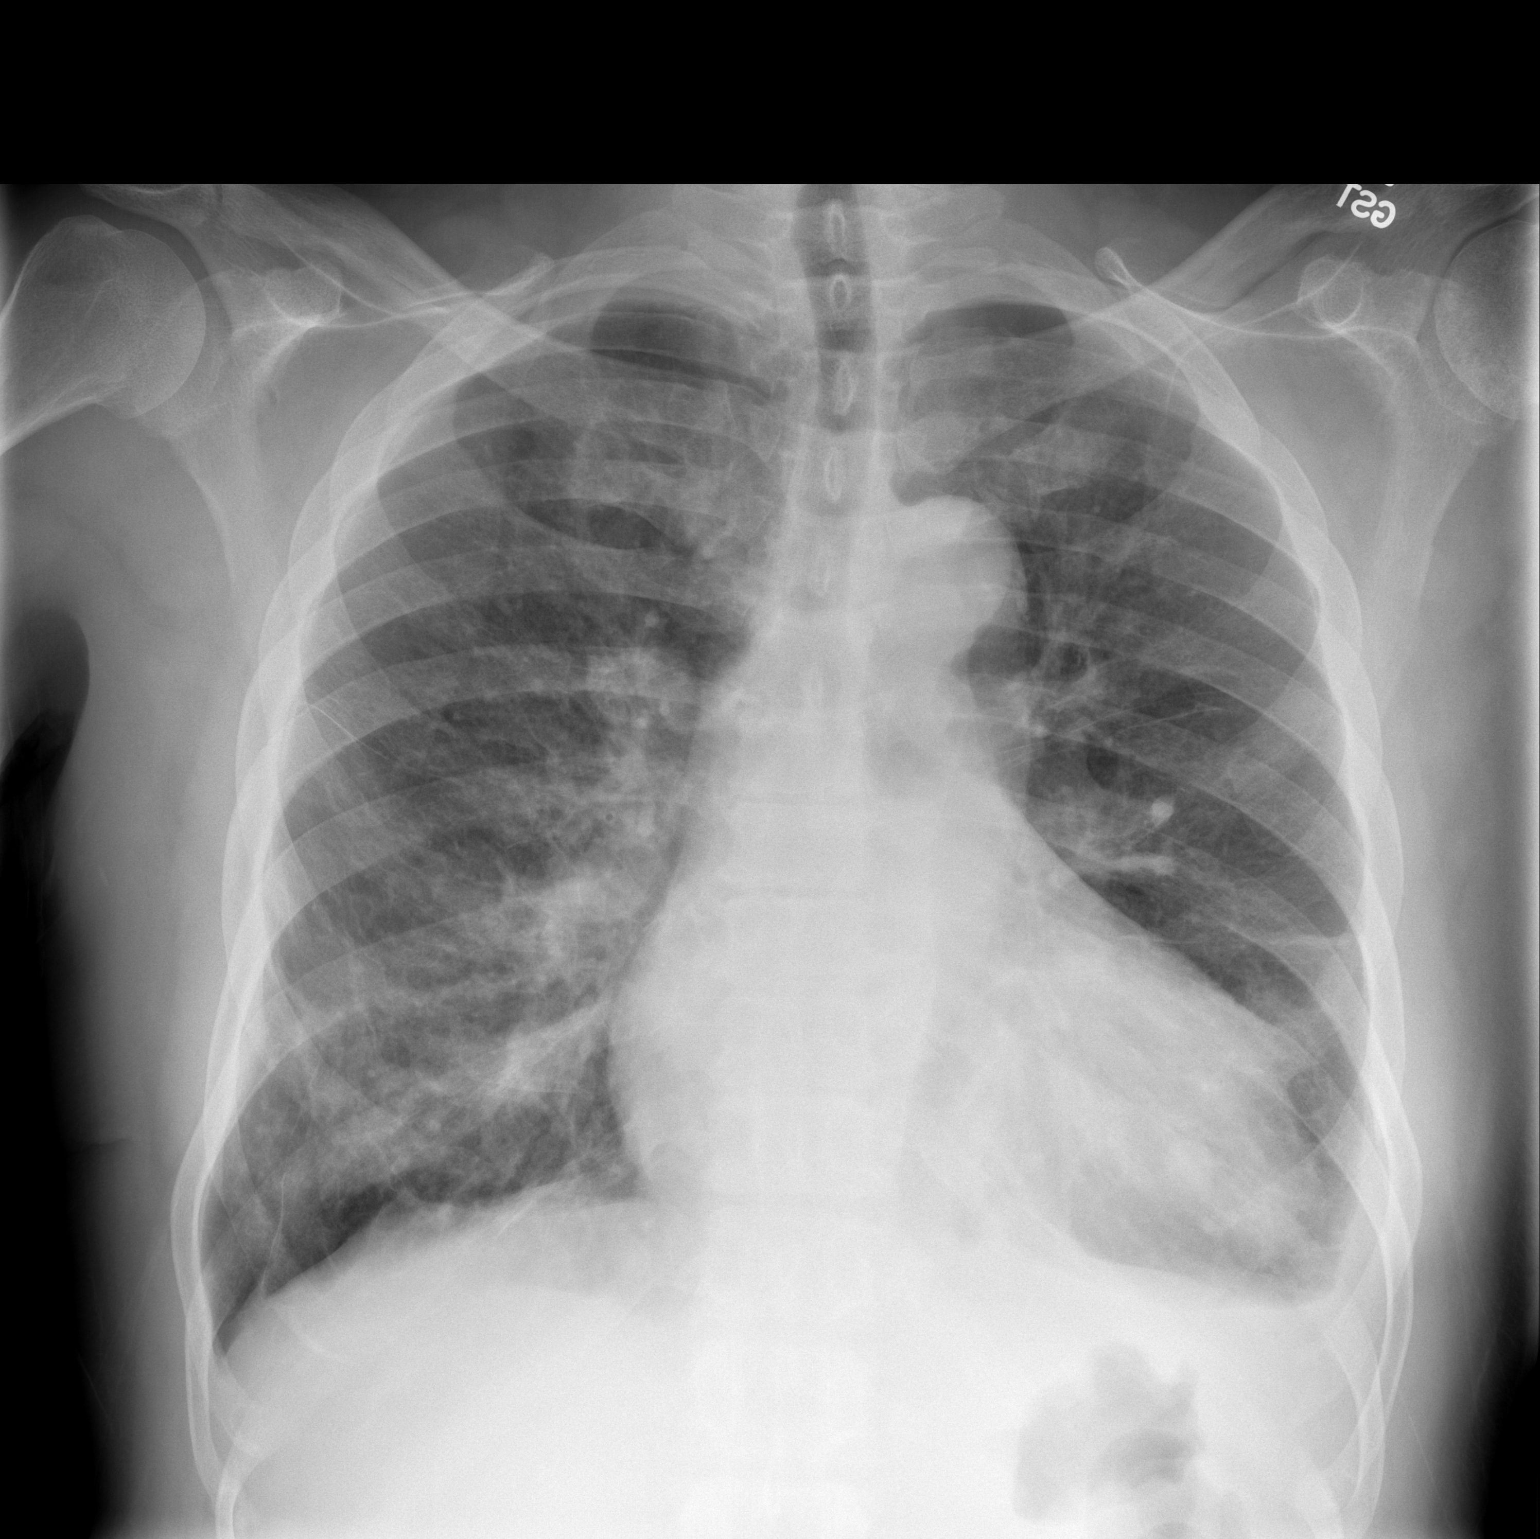

[w chest lat]
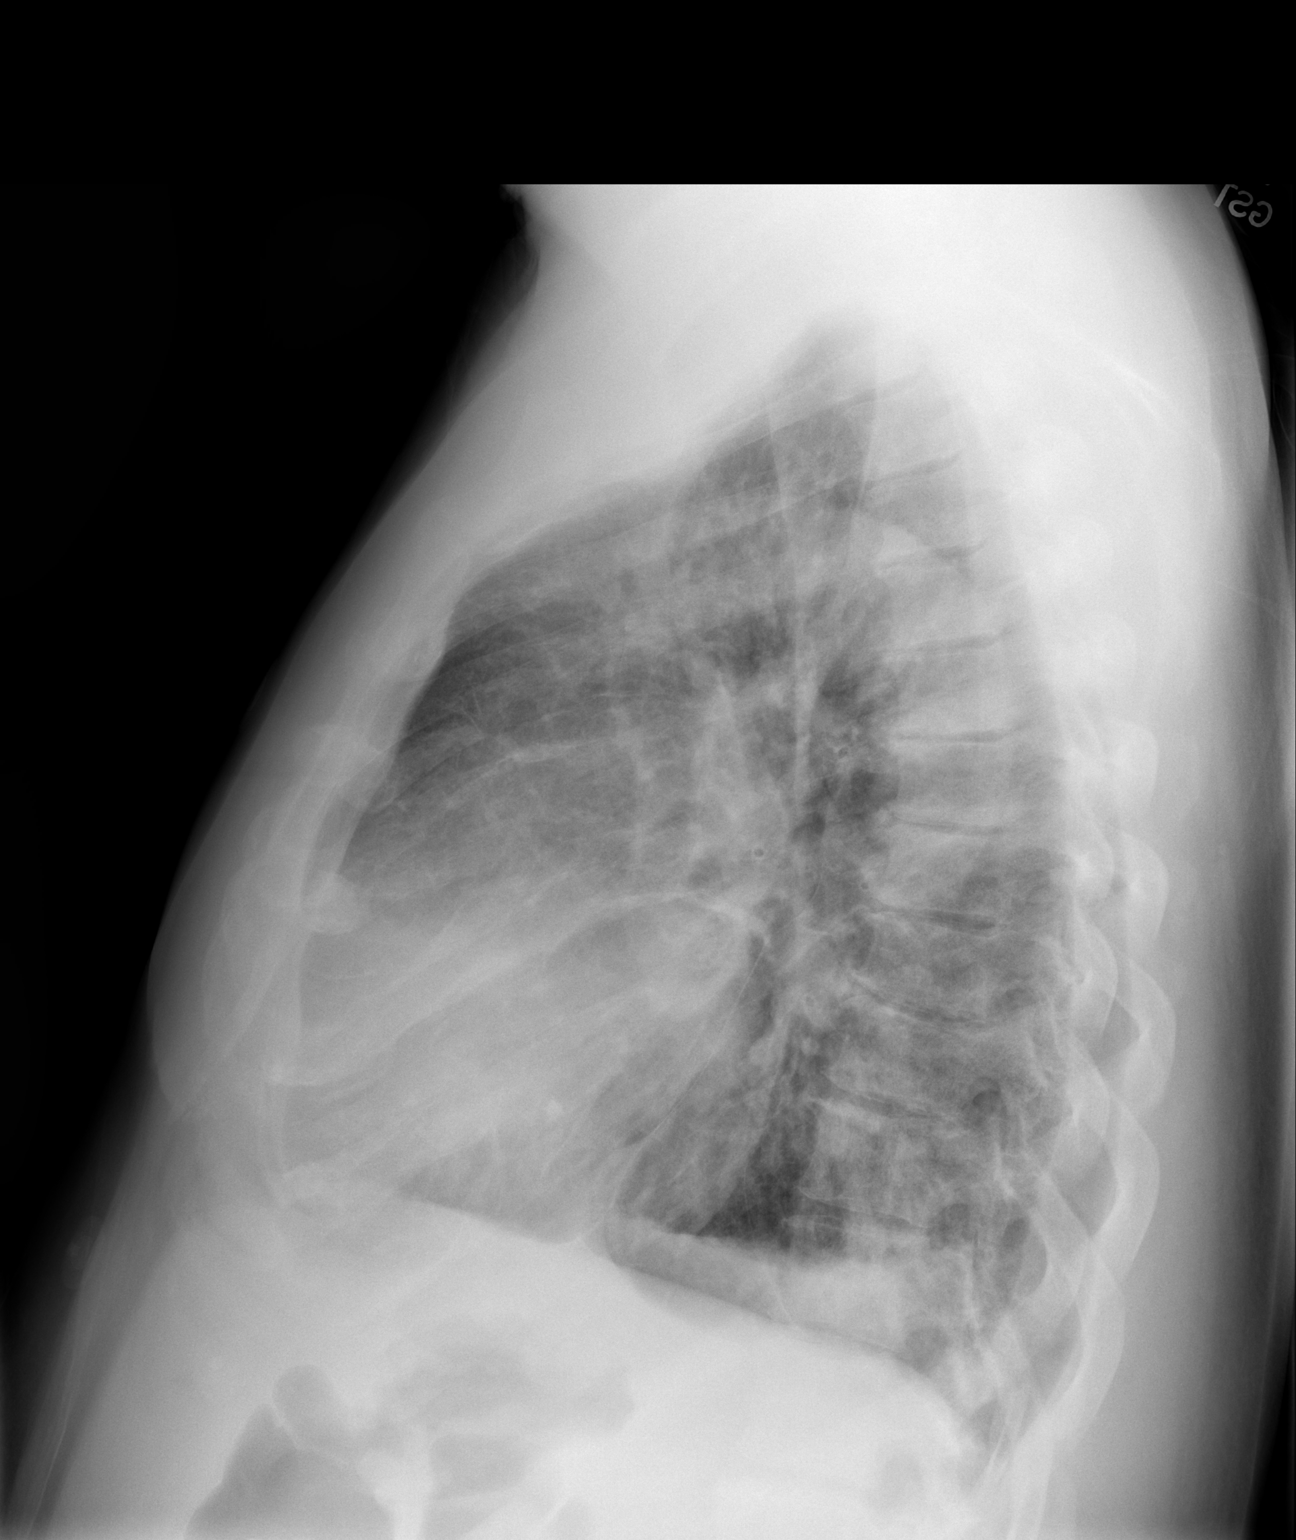

[2 of 2 positions shown; findings below may reference images not displayed]

FINDINGS: Midline trachea.  Moderate cardiomegaly.  Development of
small left pleural effusion. No pneumothorax.  Pulmonary
interstitial thickening is lower lobe predominant and similar to on
the prior exam.  Felt to be slightly increased since 08/27/2011.

Areas of scarring are lower lobe predominant and greater on the
right than left. No lobar consolidation.   The right superior hilar
region is mildly prominent, similar to on the prior exam but new or
increased since the 0220 study.
IMPRESSION: Cardiomegaly with lower lobe predominant interstitial thickening.
This is similar to on the prior exam, and remains suspicious for
pulmonary venous congestion.

Development of a small left pleural effusion.

Suggestion of right suprahilar soft tissue fullness. Possibly
related to venous hypertension.  Consider follow-up radiographs
after appropriate diuresis with special attention to this area.

## 2014-07-04 DIAGNOSIS — D631 Anemia in chronic kidney disease: Secondary | ICD-10-CM | POA: Diagnosis not present

## 2014-07-04 DIAGNOSIS — I120 Hypertensive chronic kidney disease with stage 5 chronic kidney disease or end stage renal disease: Secondary | ICD-10-CM | POA: Diagnosis not present

## 2014-07-04 DIAGNOSIS — I953 Hypotension of hemodialysis: Secondary | ICD-10-CM | POA: Diagnosis not present

## 2014-07-04 DIAGNOSIS — N186 End stage renal disease: Secondary | ICD-10-CM | POA: Diagnosis not present

## 2014-07-04 DIAGNOSIS — D509 Iron deficiency anemia, unspecified: Secondary | ICD-10-CM | POA: Diagnosis not present

## 2014-07-04 DIAGNOSIS — N2581 Secondary hyperparathyroidism of renal origin: Secondary | ICD-10-CM | POA: Diagnosis not present

## 2014-07-04 IMAGING — CR DG CHEST 2V
2 series · 2 of 2 positions shown · non-contrast
Comparison: 1 day prior

CLINICAL DATA: Dyspnea.  Fluid on lungs.  Dialysis this afternoon.

CHEST - 2 VIEW

[w chest pa]
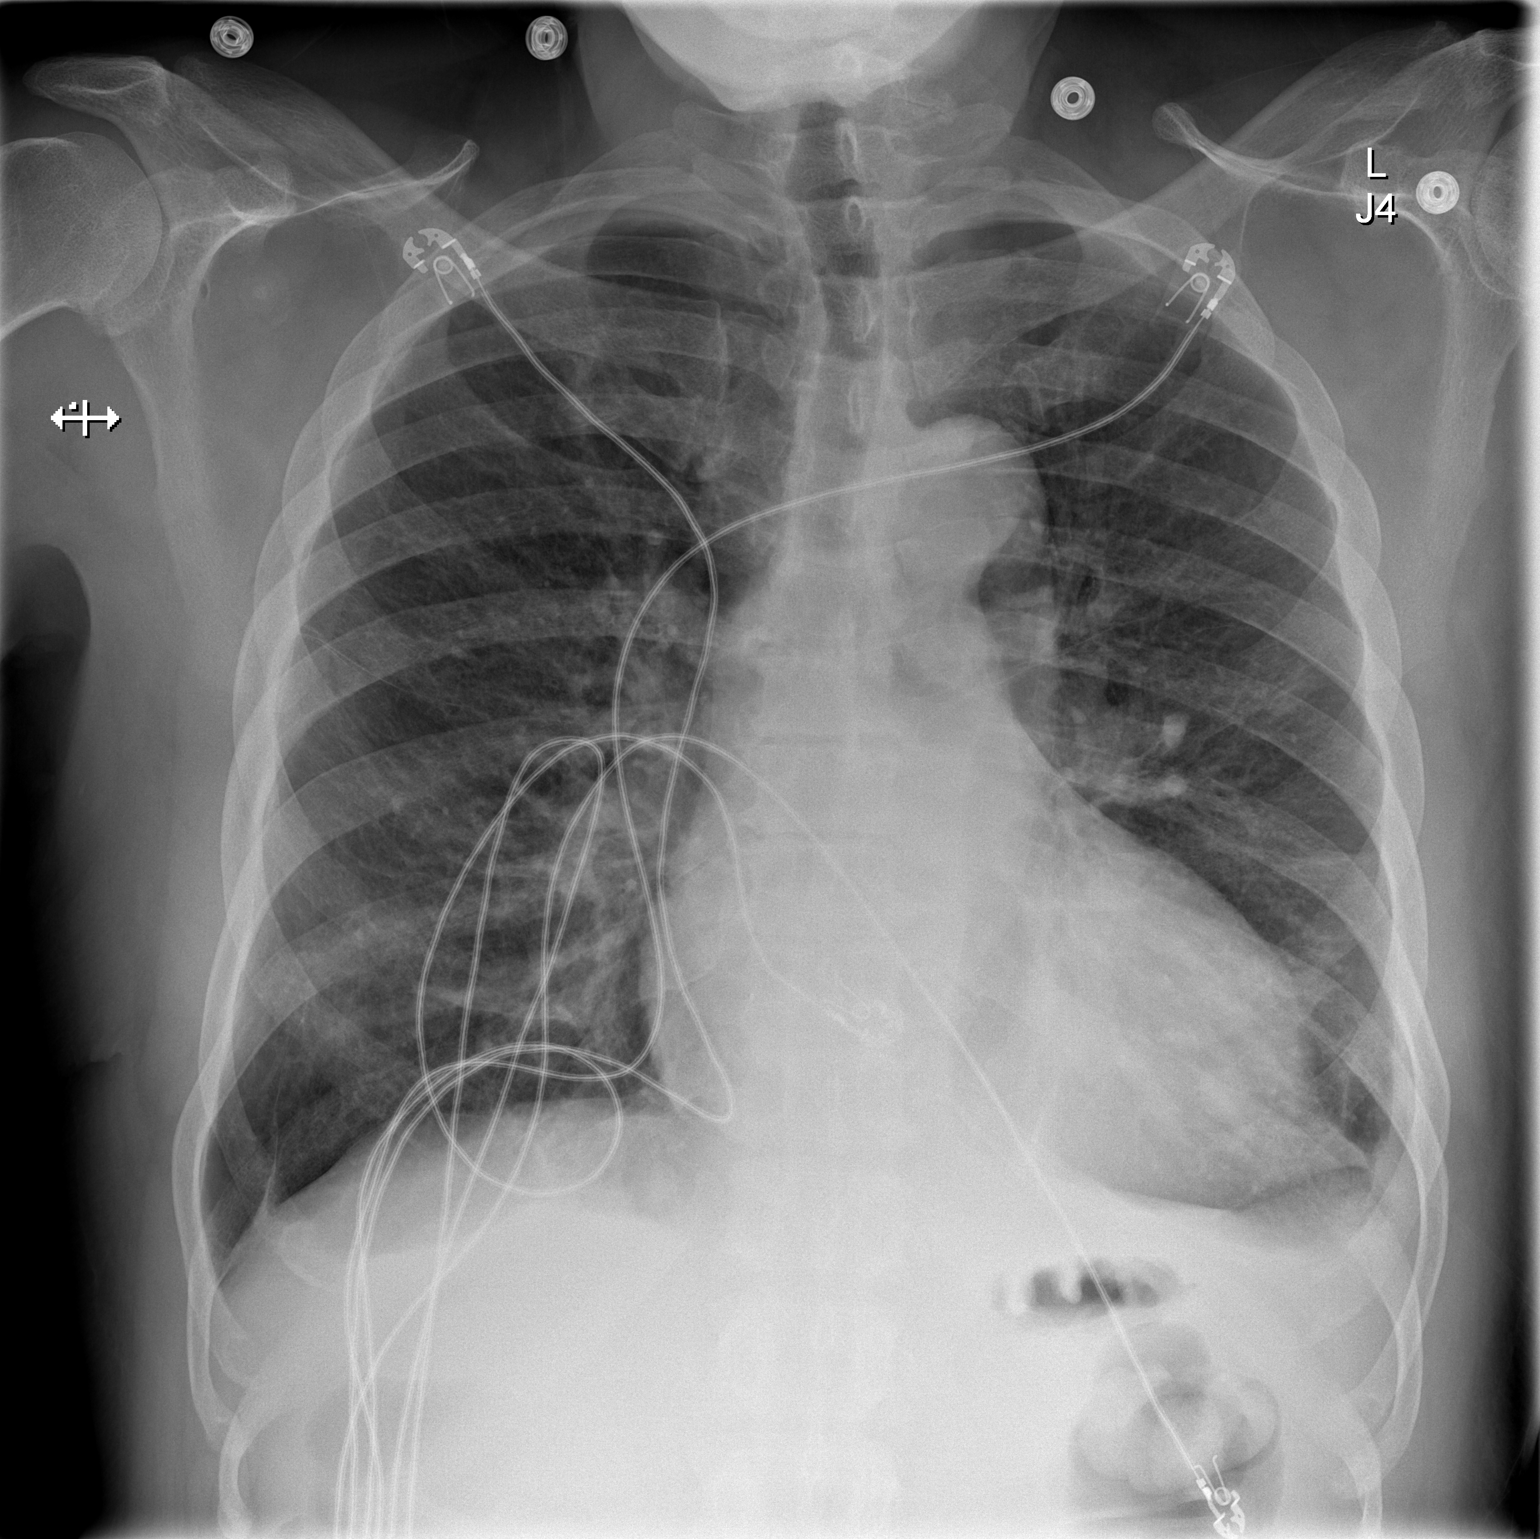

[w chest lat]
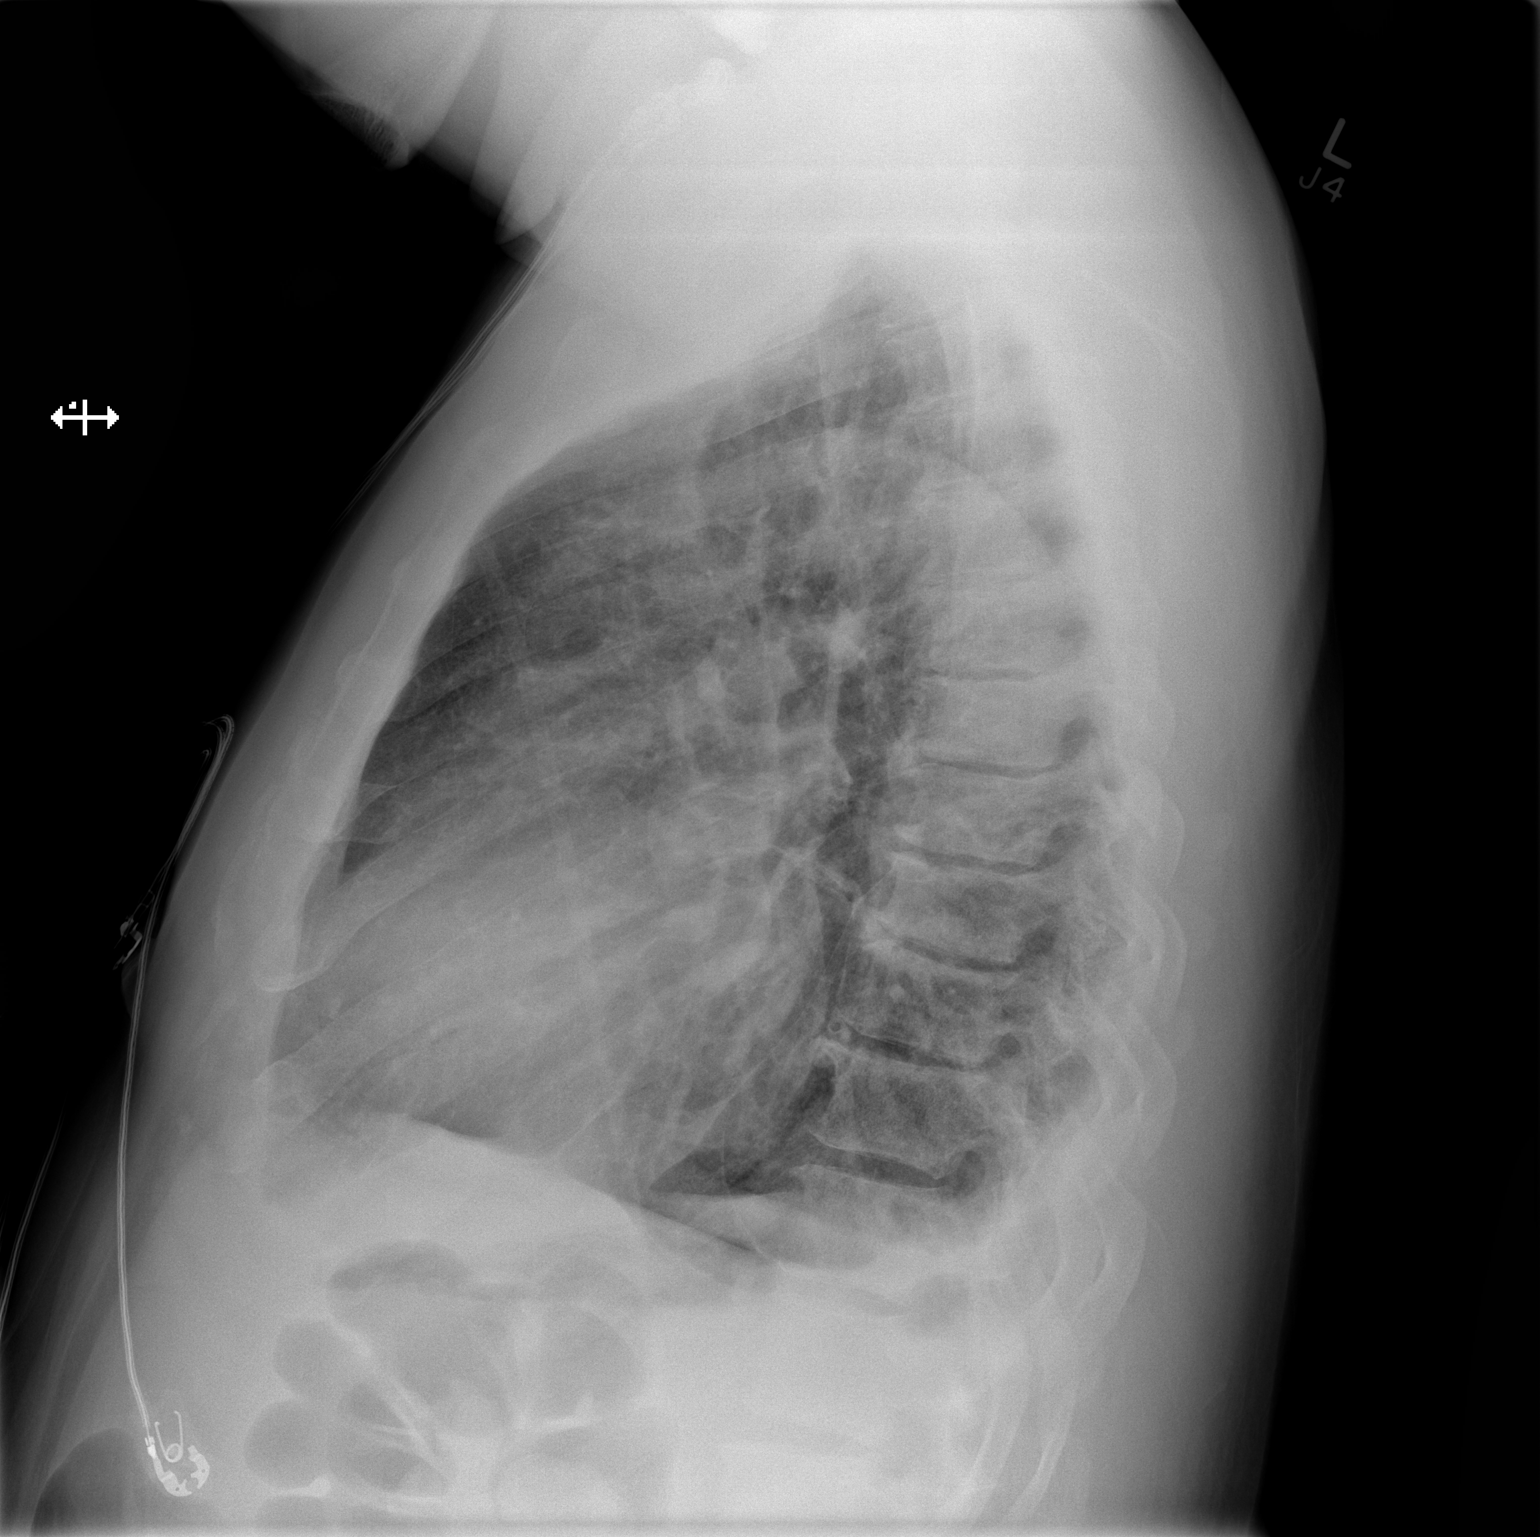

[2 of 2 positions shown; findings below may reference images not displayed]

FINDINGS: Midline trachea.  Moderate cardiomegaly.  The right
suprahilar soft tissue fullness is less apparent today.  A small
left pleural effusion is not significantly changed. No
pneumothorax.  Improved to resolved pulmonary venous congestion.
Mild scarring at the right lung base laterally.  Patchy left base
atelectasis which is improved.
IMPRESSION: Cardiomegaly with improved to resolved pulmonary venous congestion.

Similar small left pleural effusion.

## 2014-07-05 DIAGNOSIS — I120 Hypertensive chronic kidney disease with stage 5 chronic kidney disease or end stage renal disease: Secondary | ICD-10-CM | POA: Diagnosis not present

## 2014-07-05 DIAGNOSIS — I953 Hypotension of hemodialysis: Secondary | ICD-10-CM | POA: Diagnosis not present

## 2014-07-05 DIAGNOSIS — N2581 Secondary hyperparathyroidism of renal origin: Secondary | ICD-10-CM | POA: Diagnosis not present

## 2014-07-05 DIAGNOSIS — N186 End stage renal disease: Secondary | ICD-10-CM | POA: Diagnosis not present

## 2014-07-05 DIAGNOSIS — D631 Anemia in chronic kidney disease: Secondary | ICD-10-CM | POA: Diagnosis not present

## 2014-07-05 DIAGNOSIS — D509 Iron deficiency anemia, unspecified: Secondary | ICD-10-CM | POA: Diagnosis not present

## 2014-07-06 ENCOUNTER — Other Ambulatory Visit: Payer: Self-pay | Admitting: Infectious Diseases

## 2014-07-06 DIAGNOSIS — B20 Human immunodeficiency virus [HIV] disease: Secondary | ICD-10-CM

## 2014-07-07 DIAGNOSIS — D509 Iron deficiency anemia, unspecified: Secondary | ICD-10-CM | POA: Diagnosis not present

## 2014-07-07 DIAGNOSIS — N186 End stage renal disease: Secondary | ICD-10-CM | POA: Diagnosis not present

## 2014-07-07 DIAGNOSIS — N2581 Secondary hyperparathyroidism of renal origin: Secondary | ICD-10-CM | POA: Diagnosis not present

## 2014-07-07 DIAGNOSIS — I953 Hypotension of hemodialysis: Secondary | ICD-10-CM | POA: Diagnosis not present

## 2014-07-07 DIAGNOSIS — I120 Hypertensive chronic kidney disease with stage 5 chronic kidney disease or end stage renal disease: Secondary | ICD-10-CM | POA: Diagnosis not present

## 2014-07-07 DIAGNOSIS — D631 Anemia in chronic kidney disease: Secondary | ICD-10-CM | POA: Diagnosis not present

## 2014-07-08 DIAGNOSIS — I953 Hypotension of hemodialysis: Secondary | ICD-10-CM | POA: Diagnosis not present

## 2014-07-08 DIAGNOSIS — N2581 Secondary hyperparathyroidism of renal origin: Secondary | ICD-10-CM | POA: Diagnosis not present

## 2014-07-08 DIAGNOSIS — N186 End stage renal disease: Secondary | ICD-10-CM | POA: Diagnosis not present

## 2014-07-08 DIAGNOSIS — D509 Iron deficiency anemia, unspecified: Secondary | ICD-10-CM | POA: Diagnosis not present

## 2014-07-08 DIAGNOSIS — I120 Hypertensive chronic kidney disease with stage 5 chronic kidney disease or end stage renal disease: Secondary | ICD-10-CM | POA: Diagnosis not present

## 2014-07-08 DIAGNOSIS — D631 Anemia in chronic kidney disease: Secondary | ICD-10-CM | POA: Diagnosis not present

## 2014-07-09 DIAGNOSIS — D631 Anemia in chronic kidney disease: Secondary | ICD-10-CM | POA: Diagnosis not present

## 2014-07-09 DIAGNOSIS — I120 Hypertensive chronic kidney disease with stage 5 chronic kidney disease or end stage renal disease: Secondary | ICD-10-CM | POA: Diagnosis not present

## 2014-07-09 DIAGNOSIS — N2581 Secondary hyperparathyroidism of renal origin: Secondary | ICD-10-CM | POA: Diagnosis not present

## 2014-07-09 DIAGNOSIS — I953 Hypotension of hemodialysis: Secondary | ICD-10-CM | POA: Diagnosis not present

## 2014-07-09 DIAGNOSIS — D509 Iron deficiency anemia, unspecified: Secondary | ICD-10-CM | POA: Diagnosis not present

## 2014-07-09 DIAGNOSIS — N186 End stage renal disease: Secondary | ICD-10-CM | POA: Diagnosis not present

## 2014-07-11 DIAGNOSIS — D509 Iron deficiency anemia, unspecified: Secondary | ICD-10-CM | POA: Diagnosis not present

## 2014-07-11 DIAGNOSIS — I120 Hypertensive chronic kidney disease with stage 5 chronic kidney disease or end stage renal disease: Secondary | ICD-10-CM | POA: Diagnosis not present

## 2014-07-11 DIAGNOSIS — N186 End stage renal disease: Secondary | ICD-10-CM | POA: Diagnosis not present

## 2014-07-11 DIAGNOSIS — I953 Hypotension of hemodialysis: Secondary | ICD-10-CM | POA: Diagnosis not present

## 2014-07-11 DIAGNOSIS — N2581 Secondary hyperparathyroidism of renal origin: Secondary | ICD-10-CM | POA: Diagnosis not present

## 2014-07-11 DIAGNOSIS — D631 Anemia in chronic kidney disease: Secondary | ICD-10-CM | POA: Diagnosis not present

## 2014-07-12 DIAGNOSIS — N2581 Secondary hyperparathyroidism of renal origin: Secondary | ICD-10-CM | POA: Diagnosis not present

## 2014-07-12 DIAGNOSIS — I120 Hypertensive chronic kidney disease with stage 5 chronic kidney disease or end stage renal disease: Secondary | ICD-10-CM | POA: Diagnosis not present

## 2014-07-12 DIAGNOSIS — I953 Hypotension of hemodialysis: Secondary | ICD-10-CM | POA: Diagnosis not present

## 2014-07-12 DIAGNOSIS — D631 Anemia in chronic kidney disease: Secondary | ICD-10-CM | POA: Diagnosis not present

## 2014-07-12 DIAGNOSIS — N186 End stage renal disease: Secondary | ICD-10-CM | POA: Diagnosis not present

## 2014-07-12 DIAGNOSIS — D509 Iron deficiency anemia, unspecified: Secondary | ICD-10-CM | POA: Diagnosis not present

## 2014-07-14 DIAGNOSIS — D631 Anemia in chronic kidney disease: Secondary | ICD-10-CM | POA: Diagnosis not present

## 2014-07-14 DIAGNOSIS — N186 End stage renal disease: Secondary | ICD-10-CM | POA: Diagnosis not present

## 2014-07-14 DIAGNOSIS — D509 Iron deficiency anemia, unspecified: Secondary | ICD-10-CM | POA: Diagnosis not present

## 2014-07-14 DIAGNOSIS — I953 Hypotension of hemodialysis: Secondary | ICD-10-CM | POA: Diagnosis not present

## 2014-07-14 DIAGNOSIS — N2581 Secondary hyperparathyroidism of renal origin: Secondary | ICD-10-CM | POA: Diagnosis not present

## 2014-07-14 DIAGNOSIS — I120 Hypertensive chronic kidney disease with stage 5 chronic kidney disease or end stage renal disease: Secondary | ICD-10-CM | POA: Diagnosis not present

## 2014-07-15 DIAGNOSIS — N186 End stage renal disease: Secondary | ICD-10-CM | POA: Diagnosis not present

## 2014-07-15 DIAGNOSIS — D509 Iron deficiency anemia, unspecified: Secondary | ICD-10-CM | POA: Diagnosis not present

## 2014-07-15 DIAGNOSIS — I120 Hypertensive chronic kidney disease with stage 5 chronic kidney disease or end stage renal disease: Secondary | ICD-10-CM | POA: Diagnosis not present

## 2014-07-15 DIAGNOSIS — N2581 Secondary hyperparathyroidism of renal origin: Secondary | ICD-10-CM | POA: Diagnosis not present

## 2014-07-15 DIAGNOSIS — D631 Anemia in chronic kidney disease: Secondary | ICD-10-CM | POA: Diagnosis not present

## 2014-07-15 DIAGNOSIS — I953 Hypotension of hemodialysis: Secondary | ICD-10-CM | POA: Diagnosis not present

## 2014-07-17 DIAGNOSIS — D631 Anemia in chronic kidney disease: Secondary | ICD-10-CM | POA: Diagnosis not present

## 2014-07-17 DIAGNOSIS — D509 Iron deficiency anemia, unspecified: Secondary | ICD-10-CM | POA: Diagnosis not present

## 2014-07-17 DIAGNOSIS — I953 Hypotension of hemodialysis: Secondary | ICD-10-CM | POA: Diagnosis not present

## 2014-07-17 DIAGNOSIS — N186 End stage renal disease: Secondary | ICD-10-CM | POA: Diagnosis not present

## 2014-07-17 DIAGNOSIS — N2581 Secondary hyperparathyroidism of renal origin: Secondary | ICD-10-CM | POA: Diagnosis not present

## 2014-07-17 DIAGNOSIS — I120 Hypertensive chronic kidney disease with stage 5 chronic kidney disease or end stage renal disease: Secondary | ICD-10-CM | POA: Diagnosis not present

## 2014-07-19 DIAGNOSIS — I120 Hypertensive chronic kidney disease with stage 5 chronic kidney disease or end stage renal disease: Secondary | ICD-10-CM | POA: Diagnosis not present

## 2014-07-19 DIAGNOSIS — I953 Hypotension of hemodialysis: Secondary | ICD-10-CM | POA: Diagnosis not present

## 2014-07-19 DIAGNOSIS — N2581 Secondary hyperparathyroidism of renal origin: Secondary | ICD-10-CM | POA: Diagnosis not present

## 2014-07-19 DIAGNOSIS — D509 Iron deficiency anemia, unspecified: Secondary | ICD-10-CM | POA: Diagnosis not present

## 2014-07-19 DIAGNOSIS — N186 End stage renal disease: Secondary | ICD-10-CM | POA: Diagnosis not present

## 2014-07-19 DIAGNOSIS — D631 Anemia in chronic kidney disease: Secondary | ICD-10-CM | POA: Diagnosis not present

## 2014-07-21 DIAGNOSIS — I120 Hypertensive chronic kidney disease with stage 5 chronic kidney disease or end stage renal disease: Secondary | ICD-10-CM | POA: Diagnosis not present

## 2014-07-21 DIAGNOSIS — N186 End stage renal disease: Secondary | ICD-10-CM | POA: Diagnosis not present

## 2014-07-21 DIAGNOSIS — D631 Anemia in chronic kidney disease: Secondary | ICD-10-CM | POA: Diagnosis not present

## 2014-07-21 DIAGNOSIS — I953 Hypotension of hemodialysis: Secondary | ICD-10-CM | POA: Diagnosis not present

## 2014-07-21 DIAGNOSIS — N2581 Secondary hyperparathyroidism of renal origin: Secondary | ICD-10-CM | POA: Diagnosis not present

## 2014-07-21 DIAGNOSIS — D509 Iron deficiency anemia, unspecified: Secondary | ICD-10-CM | POA: Diagnosis not present

## 2014-07-21 DIAGNOSIS — Z992 Dependence on renal dialysis: Secondary | ICD-10-CM | POA: Diagnosis not present

## 2014-07-22 DIAGNOSIS — D509 Iron deficiency anemia, unspecified: Secondary | ICD-10-CM | POA: Diagnosis not present

## 2014-07-22 DIAGNOSIS — I120 Hypertensive chronic kidney disease with stage 5 chronic kidney disease or end stage renal disease: Secondary | ICD-10-CM | POA: Diagnosis not present

## 2014-07-22 DIAGNOSIS — Z79899 Other long term (current) drug therapy: Secondary | ICD-10-CM | POA: Diagnosis not present

## 2014-07-22 DIAGNOSIS — D631 Anemia in chronic kidney disease: Secondary | ICD-10-CM | POA: Diagnosis not present

## 2014-07-22 DIAGNOSIS — I953 Hypotension of hemodialysis: Secondary | ICD-10-CM | POA: Diagnosis not present

## 2014-07-22 DIAGNOSIS — N186 End stage renal disease: Secondary | ICD-10-CM | POA: Diagnosis not present

## 2014-07-23 DIAGNOSIS — N186 End stage renal disease: Secondary | ICD-10-CM | POA: Diagnosis not present

## 2014-07-23 DIAGNOSIS — T82858A Stenosis of vascular prosthetic devices, implants and grafts, initial encounter: Secondary | ICD-10-CM | POA: Diagnosis not present

## 2014-07-23 DIAGNOSIS — Z992 Dependence on renal dialysis: Secondary | ICD-10-CM | POA: Diagnosis not present

## 2014-07-23 DIAGNOSIS — I871 Compression of vein: Secondary | ICD-10-CM | POA: Diagnosis not present

## 2014-07-31 ENCOUNTER — Encounter (HOSPITAL_COMMUNITY): Payer: Self-pay | Admitting: Emergency Medicine

## 2014-07-31 ENCOUNTER — Emergency Department (HOSPITAL_COMMUNITY): Payer: Medicare Other

## 2014-07-31 DIAGNOSIS — J9809 Other diseases of bronchus, not elsewhere classified: Secondary | ICD-10-CM | POA: Diagnosis not present

## 2014-07-31 DIAGNOSIS — R079 Chest pain, unspecified: Secondary | ICD-10-CM | POA: Insufficient documentation

## 2014-07-31 DIAGNOSIS — R5381 Other malaise: Secondary | ICD-10-CM | POA: Diagnosis not present

## 2014-07-31 DIAGNOSIS — Z8619 Personal history of other infectious and parasitic diseases: Secondary | ICD-10-CM | POA: Insufficient documentation

## 2014-07-31 DIAGNOSIS — I120 Hypertensive chronic kidney disease with stage 5 chronic kidney disease or end stage renal disease: Secondary | ICD-10-CM | POA: Insufficient documentation

## 2014-07-31 DIAGNOSIS — N186 End stage renal disease: Secondary | ICD-10-CM | POA: Diagnosis not present

## 2014-07-31 DIAGNOSIS — Z21 Asymptomatic human immunodeficiency virus [HIV] infection status: Secondary | ICD-10-CM | POA: Insufficient documentation

## 2014-07-31 DIAGNOSIS — J45901 Unspecified asthma with (acute) exacerbation: Secondary | ICD-10-CM | POA: Insufficient documentation

## 2014-07-31 DIAGNOSIS — R000 Tachycardia, unspecified: Secondary | ICD-10-CM | POA: Insufficient documentation

## 2014-07-31 DIAGNOSIS — Z8739 Personal history of other diseases of the musculoskeletal system and connective tissue: Secondary | ICD-10-CM | POA: Diagnosis not present

## 2014-07-31 DIAGNOSIS — D649 Anemia, unspecified: Secondary | ICD-10-CM | POA: Insufficient documentation

## 2014-07-31 DIAGNOSIS — J42 Unspecified chronic bronchitis: Secondary | ICD-10-CM | POA: Diagnosis not present

## 2014-07-31 DIAGNOSIS — Z87891 Personal history of nicotine dependence: Secondary | ICD-10-CM | POA: Diagnosis not present

## 2014-07-31 DIAGNOSIS — Z992 Dependence on renal dialysis: Secondary | ICD-10-CM | POA: Diagnosis not present

## 2014-07-31 DIAGNOSIS — R0602 Shortness of breath: Secondary | ICD-10-CM | POA: Diagnosis not present

## 2014-07-31 DIAGNOSIS — F419 Anxiety disorder, unspecified: Secondary | ICD-10-CM | POA: Diagnosis not present

## 2014-07-31 DIAGNOSIS — Z880 Allergy status to penicillin: Secondary | ICD-10-CM | POA: Diagnosis not present

## 2014-07-31 DIAGNOSIS — M109 Gout, unspecified: Secondary | ICD-10-CM | POA: Diagnosis not present

## 2014-07-31 DIAGNOSIS — I10 Essential (primary) hypertension: Secondary | ICD-10-CM | POA: Diagnosis not present

## 2014-07-31 LAB — CBC WITH DIFFERENTIAL/PLATELET
Basophils Absolute: 0 10*3/uL (ref 0.0–0.1)
Basophils Relative: 0 % (ref 0–1)
Eosinophils Absolute: 0.4 10*3/uL (ref 0.0–0.7)
Eosinophils Relative: 5 % (ref 0–5)
HEMATOCRIT: 38.8 % — AB (ref 39.0–52.0)
HEMOGLOBIN: 13.4 g/dL (ref 13.0–17.0)
LYMPHS PCT: 16 % (ref 12–46)
Lymphs Abs: 1.2 10*3/uL (ref 0.7–4.0)
MCH: 33.4 pg (ref 26.0–34.0)
MCHC: 34.5 g/dL (ref 30.0–36.0)
MCV: 96.8 fL (ref 78.0–100.0)
MONO ABS: 0.6 10*3/uL (ref 0.1–1.0)
Monocytes Relative: 8 % (ref 3–12)
NEUTROS PCT: 71 % (ref 43–77)
Neutro Abs: 5.5 10*3/uL (ref 1.7–7.7)
Platelets: 175 10*3/uL (ref 150–400)
RBC: 4.01 MIL/uL — AB (ref 4.22–5.81)
RDW: 13.7 % (ref 11.5–15.5)
WBC: 7.6 10*3/uL (ref 4.0–10.5)

## 2014-07-31 LAB — BASIC METABOLIC PANEL
Anion gap: 18 — ABNORMAL HIGH (ref 5–15)
BUN: 36 mg/dL — AB (ref 6–23)
CHLORIDE: 91 meq/L — AB (ref 96–112)
CO2: 26 meq/L (ref 19–32)
Calcium: 9.6 mg/dL (ref 8.4–10.5)
Creatinine, Ser: 9.52 mg/dL — ABNORMAL HIGH (ref 0.50–1.35)
GFR calc Af Amer: 6 mL/min — ABNORMAL LOW (ref >90)
GFR calc non Af Amer: 5 mL/min — ABNORMAL LOW (ref >90)
Glucose, Bld: 107 mg/dL — ABNORMAL HIGH (ref 70–99)
POTASSIUM: 3.8 meq/L (ref 3.7–5.3)
Sodium: 135 mEq/L — ABNORMAL LOW (ref 137–147)

## 2014-07-31 LAB — I-STAT TROPONIN, ED: Troponin i, poc: 0.03 ng/mL (ref 0.00–0.08)

## 2014-07-31 MED ORDER — ALBUTEROL SULFATE (2.5 MG/3ML) 0.083% IN NEBU
5.0000 mg | INHALATION_SOLUTION | Freq: Once | RESPIRATORY_TRACT | Status: AC
  Administered 2014-07-31: 5 mg via RESPIRATORY_TRACT
  Filled 2014-07-31: qty 6

## 2014-07-31 NOTE — ED Notes (Signed)
The patient said he had dialysis and he is not feeling well.  He says he has been having coughing for about two or three days.  The patient says he knows his chest pain is from the non-productive cough.  He does have a low grade temp and has a runny nose along with malaise.

## 2014-08-01 ENCOUNTER — Emergency Department (HOSPITAL_COMMUNITY)
Admission: EM | Admit: 2014-08-01 | Discharge: 2014-08-01 | Disposition: A | Discharge status: Home / Self Care | Payer: Medicare Other | Attending: Emergency Medicine | Admitting: Emergency Medicine

## 2014-08-01 DIAGNOSIS — J40 Bronchitis, not specified as acute or chronic: Secondary | ICD-10-CM

## 2014-08-01 MED ORDER — PREDNISONE 20 MG PO TABS: 60.0000 mg | ORAL_TABLET | Freq: Every day | ORAL | Status: DC

## 2014-08-01 MED ORDER — IPRATROPIUM-ALBUTEROL 0.5-2.5 (3) MG/3ML IN SOLN
3.0000 mL | Freq: Once | RESPIRATORY_TRACT | Status: AC
  Administered 2014-08-01: 3 mL via RESPIRATORY_TRACT

## 2014-08-01 MED ORDER — PREDNISONE 20 MG PO TABS
60.0000 mg | ORAL_TABLET | Freq: Once | ORAL | Status: AC
  Administered 2014-08-01: 60 mg via ORAL
  Filled 2014-08-01: qty 3

## 2014-08-01 NOTE — ED Provider Notes (Signed)
CSN: [id number]     Arrival date & time 07/31/14  2145 History   First MD Initiated Contact with Patient 08/01/14 0058     Chief Complaint  Patient presents with  . Chest Pain    The patient said he had dialysis and he is not feeling well.  He says he has been having coughing for about two or three days.    . Shortness of Breath     (Consider location/radiation/quality/duration/timing/severity/associated sxs/prior Treatment) HPI  This is a 58 year old male with a history of hypertension, HIV, seizure disorder, asthma, and end-stage renal disease on home dialysis who presents with cough, chest pain, and generalized malaise. Patient reports onset of symptoms 4 days ago. He reports a cough that is nonproductive. When he coughs he experiences back and chest pain. Pain only comes when he coughs. He denies any fevers at home and takes his temperature regularly before and after dialysis. He uses Combivent for asthma and has received 2 nebs prior to my evaluation and states that he feels better. He reports generalized malaise. He denies any nausea, vomiting, abdominal pain. He does not make urine.  Past Medical History  Diagnosis Date  . Hypertension   . Gout   . Anemia   . Seizures 06/02/2011  . Thrombocytopenia   . Anxiety   . Hepatitis B carrier   . HIV infection   . Seizure   . Asthma     per pt hx  . End stage renal disease on home HD 07/10/2011    Started HD in September 2012 at [facility] with a tunneled HD catheter, now on home HD with NxtStage. Dialyzing through AVF L lower arm with buttonhole technique as of mid 2014. His brother does the HD treatments at home.  They are roommates for 23 years.  The brother works 3rd shift and gets off about 8am and then puts Mr Wissman on HD in the morning after getting home. Most of the time he does HD about 4 times a week, for about 4 hours per treatment. Cause of ESRD was HTN according to patient. He says he let his health go and ending up with  complications, and that he didn't like seeing doctors in those days.  He says he was diagnosed with severe HTN when he lived in [place] in his 30's.    Past Surgical History  Procedure Laterality Date  . Av fistula placement  06/02/11    Left radiocephalic AVF  . Other surgical history      removal temporary HD catheter    Family History  Problem Relation Age of Onset  . Hypertension Mother   . Cancer Father    History  Substance Use Topics  . Smoking status: Former Smoker    Types: Cigarettes  . Smokeless tobacco: Never Used     Comment: not ready to quit yet; 1 pack lasts 2 months.  . Alcohol Use: No    Review of Systems  Constitutional: Positive for fatigue. Negative for fever.  Respiratory: Positive for cough. Negative for chest tightness and shortness of breath.   Cardiovascular: Positive for chest pain. Negative for palpitations and leg swelling.  Gastrointestinal: Negative.  Negative for nausea, vomiting, abdominal pain and diarrhea.  Genitourinary: Negative.   Skin: Negative for rash.  Neurological: Negative for headaches.  All other systems reviewed and are negative.     Allergies  Lisinopril; Penicillin g; and Penicillins  Home Medications   Prior to Admission medications   Medication  Sig Start Date End Date Taking? Authorizing Provider  albuterol-ipratropium (COMBIVENT) 18-103 MCG/ACT inhaler Inhale 1 puff into the lungs every 4 (four) hours.   Yes Historical Provider, MD  amLODipine (NORVASC) 10 MG tablet Take 1 tablet (10 mg total) by mouth daily. 06/27/13  Yes Hester Mates, MD  calcitRIOL (ROCALTROL) 0.25 MCG capsule Take 0.25 mcg by mouth daily.   Yes Historical Provider, MD  Calcium Acetate 667 MG TABS Take 1,334-2,001 mg by mouth 3 (three) times daily with meals. Takes 3 capsules with meals and 2 capsules with snacks   Yes Historical Provider, MD  lamivudine (EPIVIR-HBV) 5 MG/ML solution Take 25 mg by mouth daily.   Yes Historical Provider, MD   multivitamin (RENA-VIT) TABS tablet Take 1 tablet by mouth daily.     Yes Historical Provider, MD  raltegravir (ISENTRESS) 400 MG tablet Take 400 mg by mouth 2 (two) times daily.   Yes Historical Provider, MD  tenofovir (VIREAD) 300 MG tablet Take 1 tablet (300 mg total) by mouth every 7 (seven) days. After dialysis Patient taking differently: Take 300 mg by mouth every 7 (seven) days. After dialysis on Saturday 08/28/13  Yes Campbell Riches, MD  traZODone (DESYREL) 50 MG tablet Take 50 mg by mouth at bedtime as needed for sleep.   Yes Historical Provider, MD  EPIVIR HBV 5 MG/ML solution TAKE 5 MLS BY MOUTH DAILY 07/06/14   Campbell Riches, MD  ISENTRESS 400 MG tablet TAKE 1 TABLET BY MOUTH TWICE DAILY    Campbell Riches, MD  labetalol (NORMODYNE) 200 MG tablet Take 200 mg by mouth 2 (two) times daily.     Historical Provider, MD  predniSONE (DELTASONE) 20 MG tablet Take 3 tablets (60 mg total) by mouth daily with breakfast. 08/01/14   Merryl Hacker, MD   BP 117/94 mmHg  Pulse 110  Temp(Src) 98.5 F (36.9 C) (Oral)  Resp 20  Ht 5\' 11"  (1.803 m)  Wt 210 lb 12.2 oz (95.6 kg)  BMI 29.41 kg/m2  SpO2 95% Physical Exam  Constitutional: He is oriented to person, place, and time. No distress.  HENT:  Head: Normocephalic and atraumatic.  Eyes: Pupils are equal, round, and reactive to light.  Cardiovascular: Regular rhythm and normal heart sounds.   No murmur heard. tachycardia  Pulmonary/Chest: Effort normal. No respiratory distress. He has wheezes. He exhibits tenderness.  Scant expiratory wheeze  Abdominal: Soft. Bowel sounds are normal. There is no tenderness. There is no rebound.  Musculoskeletal: He exhibits no edema.  Fistula with palpable thrill in left upper extremity  Neurological: He is alert and oriented to person, place, and time.  Skin: Skin is warm and dry.  Psychiatric: He has a normal mood and affect.  Nursing note and vitals reviewed.   ED Course  Procedures  (including critical care time) Labs Review Labs Reviewed  BASIC METABOLIC PANEL - Abnormal; Notable for the following:    Sodium 135 (*)    Chloride 91 (*)    Glucose, Bld 107 (*)    BUN 36 (*)    Creatinine, Ser 9.52 (*)    GFR calc non Af Amer 5 (*)    GFR calc Af Amer 6 (*)    Anion gap 18 (*)    All other components within normal limits  CBC WITH DIFFERENTIAL - Abnormal; Notable for the following:    RBC 4.01 (*)    HCT 38.8 (*)    All other components within normal limits  Randolm Idol, ED    Imaging Review Dg Chest 2 View (if Patient Has Fever And/or Copd)  07/31/2014   CLINICAL DATA:  Cough, cold symptoms, shortness of breath.  EXAM: CHEST  2 VIEW  COMPARISON:  PA and lateral chest 01/02/2013 and 06/12/2013.  FINDINGS: There is no focal airspace disease or effusion. Mild peribronchial thickening is unchanged. No consolidative process is identified. Heart size is normal. No focal bony abnormality.  IMPRESSION: No acute disease. Chronic bronchitic change appears stable compared to prior exams.   Electronically Signed   By: Inge Rise M.D.   On: 07/31/2014 22:56     EKG Interpretation   Date/Time:  Tuesday July 31 2014 21:51:11 EST Ventricular Rate:  125 PR Interval:  180 QRS Duration: 90 QT Interval:  294 QTC Calculation: 424 R Axis:   162 Text Interpretation:  Sinus tachycardia Biatrial enlargement Left  posterior fascicular block Abnormal ECG Left posterior fasicular block New  when compared to prior Confirmed by Leyton Magoon  MD, [place] ([id number]) on  08/01/2014 12:58:24 AM      MDM   Final diagnoses:  Bronchitis    Patient presents with cough, chest pain, generalized malaise. Nontoxic on exam. Mildly tachycardic but he is status post 2 breathing treatments on my evaluation. He is otherwise nontoxic. He has scant wheezing on exam. Reports a history of asthma.  He is afebrile. Lab work is unremarkable. Troponin is negative and EKG is reassuring. Chest  pain is reproducible on exam and associated with cough. Chest x-ray shows no evidence of pneumonia. Suspect acute on chronic bronchitis/viral URI.  Patient reports improvement following DuoNeb's. He is satting 95-98% on room air. Patient given one dose of prednisone. Will place on 5 day burst of prednisone. Patient has inhalers at home. Discussed diagnosis with patient and he stated understanding.  After history, exam, and medical workup I feel the patient has been appropriately medically screened and is safe for discharge home. Pertinent diagnoses were discussed with the patient. Patient was given return precautions.     Merryl Hacker, MD 08/01/14 [phone]

## 2014-08-01 NOTE — Discharge Instructions (Signed)

## 2014-08-16 ENCOUNTER — Other Ambulatory Visit: Payer: Self-pay | Admitting: Infectious Diseases

## 2014-08-20 DIAGNOSIS — Z992 Dependence on renal dialysis: Secondary | ICD-10-CM | POA: Diagnosis not present

## 2014-08-20 DIAGNOSIS — N186 End stage renal disease: Secondary | ICD-10-CM | POA: Diagnosis not present

## 2014-08-21 ENCOUNTER — Other Ambulatory Visit: Payer: Self-pay | Admitting: *Deleted

## 2014-08-22 DIAGNOSIS — Z79899 Other long term (current) drug therapy: Secondary | ICD-10-CM | POA: Diagnosis not present

## 2014-08-22 DIAGNOSIS — D631 Anemia in chronic kidney disease: Secondary | ICD-10-CM | POA: Diagnosis not present

## 2014-08-22 DIAGNOSIS — N186 End stage renal disease: Secondary | ICD-10-CM | POA: Diagnosis not present

## 2014-08-22 DIAGNOSIS — D509 Iron deficiency anemia, unspecified: Secondary | ICD-10-CM | POA: Diagnosis not present

## 2014-08-22 DIAGNOSIS — N2581 Secondary hyperparathyroidism of renal origin: Secondary | ICD-10-CM | POA: Diagnosis not present

## 2014-09-06 DIAGNOSIS — M109 Gout, unspecified: Secondary | ICD-10-CM | POA: Diagnosis not present

## 2014-09-20 DIAGNOSIS — N186 End stage renal disease: Secondary | ICD-10-CM | POA: Diagnosis not present

## 2014-09-20 DIAGNOSIS — Z992 Dependence on renal dialysis: Secondary | ICD-10-CM | POA: Diagnosis not present

## 2014-09-22 DIAGNOSIS — Z4931 Encounter for adequacy testing for hemodialysis: Secondary | ICD-10-CM | POA: Diagnosis not present

## 2014-09-22 DIAGNOSIS — D509 Iron deficiency anemia, unspecified: Secondary | ICD-10-CM | POA: Diagnosis not present

## 2014-09-22 DIAGNOSIS — N186 End stage renal disease: Secondary | ICD-10-CM | POA: Diagnosis not present

## 2014-09-22 DIAGNOSIS — N2581 Secondary hyperparathyroidism of renal origin: Secondary | ICD-10-CM | POA: Diagnosis not present

## 2014-09-22 DIAGNOSIS — D631 Anemia in chronic kidney disease: Secondary | ICD-10-CM | POA: Diagnosis not present

## 2014-09-24 DIAGNOSIS — Z4931 Encounter for adequacy testing for hemodialysis: Secondary | ICD-10-CM | POA: Diagnosis not present

## 2014-09-24 DIAGNOSIS — N186 End stage renal disease: Secondary | ICD-10-CM | POA: Diagnosis not present

## 2014-09-24 DIAGNOSIS — D631 Anemia in chronic kidney disease: Secondary | ICD-10-CM | POA: Diagnosis not present

## 2014-09-24 DIAGNOSIS — D509 Iron deficiency anemia, unspecified: Secondary | ICD-10-CM | POA: Diagnosis not present

## 2014-09-24 DIAGNOSIS — N2581 Secondary hyperparathyroidism of renal origin: Secondary | ICD-10-CM | POA: Diagnosis not present

## 2014-09-26 DIAGNOSIS — D631 Anemia in chronic kidney disease: Secondary | ICD-10-CM | POA: Diagnosis not present

## 2014-09-26 DIAGNOSIS — N186 End stage renal disease: Secondary | ICD-10-CM | POA: Diagnosis not present

## 2014-09-26 DIAGNOSIS — D509 Iron deficiency anemia, unspecified: Secondary | ICD-10-CM | POA: Diagnosis not present

## 2014-09-26 DIAGNOSIS — Z4931 Encounter for adequacy testing for hemodialysis: Secondary | ICD-10-CM | POA: Diagnosis not present

## 2014-09-26 DIAGNOSIS — N2581 Secondary hyperparathyroidism of renal origin: Secondary | ICD-10-CM | POA: Diagnosis not present

## 2014-09-28 DIAGNOSIS — D509 Iron deficiency anemia, unspecified: Secondary | ICD-10-CM | POA: Diagnosis not present

## 2014-09-28 DIAGNOSIS — N2581 Secondary hyperparathyroidism of renal origin: Secondary | ICD-10-CM | POA: Diagnosis not present

## 2014-09-28 DIAGNOSIS — N186 End stage renal disease: Secondary | ICD-10-CM | POA: Diagnosis not present

## 2014-09-28 DIAGNOSIS — E785 Hyperlipidemia, unspecified: Secondary | ICD-10-CM | POA: Diagnosis not present

## 2014-09-28 DIAGNOSIS — D631 Anemia in chronic kidney disease: Secondary | ICD-10-CM | POA: Diagnosis not present

## 2014-09-28 DIAGNOSIS — Z4931 Encounter for adequacy testing for hemodialysis: Secondary | ICD-10-CM | POA: Diagnosis not present

## 2014-09-28 DIAGNOSIS — M109 Gout, unspecified: Secondary | ICD-10-CM | POA: Diagnosis not present

## 2014-10-01 DIAGNOSIS — D631 Anemia in chronic kidney disease: Secondary | ICD-10-CM | POA: Diagnosis not present

## 2014-10-01 DIAGNOSIS — D509 Iron deficiency anemia, unspecified: Secondary | ICD-10-CM | POA: Diagnosis not present

## 2014-10-01 DIAGNOSIS — N2581 Secondary hyperparathyroidism of renal origin: Secondary | ICD-10-CM | POA: Diagnosis not present

## 2014-10-01 DIAGNOSIS — Z4931 Encounter for adequacy testing for hemodialysis: Secondary | ICD-10-CM | POA: Diagnosis not present

## 2014-10-01 DIAGNOSIS — N186 End stage renal disease: Secondary | ICD-10-CM | POA: Diagnosis not present

## 2014-10-03 DIAGNOSIS — D631 Anemia in chronic kidney disease: Secondary | ICD-10-CM | POA: Diagnosis not present

## 2014-10-03 DIAGNOSIS — N2581 Secondary hyperparathyroidism of renal origin: Secondary | ICD-10-CM | POA: Diagnosis not present

## 2014-10-03 DIAGNOSIS — N186 End stage renal disease: Secondary | ICD-10-CM | POA: Diagnosis not present

## 2014-10-03 DIAGNOSIS — D509 Iron deficiency anemia, unspecified: Secondary | ICD-10-CM | POA: Diagnosis not present

## 2014-10-03 DIAGNOSIS — Z4931 Encounter for adequacy testing for hemodialysis: Secondary | ICD-10-CM | POA: Diagnosis not present

## 2014-10-04 DIAGNOSIS — Z4931 Encounter for adequacy testing for hemodialysis: Secondary | ICD-10-CM | POA: Diagnosis not present

## 2014-10-04 DIAGNOSIS — D509 Iron deficiency anemia, unspecified: Secondary | ICD-10-CM | POA: Diagnosis not present

## 2014-10-04 DIAGNOSIS — D631 Anemia in chronic kidney disease: Secondary | ICD-10-CM | POA: Diagnosis not present

## 2014-10-04 DIAGNOSIS — N186 End stage renal disease: Secondary | ICD-10-CM | POA: Diagnosis not present

## 2014-10-04 DIAGNOSIS — N2581 Secondary hyperparathyroidism of renal origin: Secondary | ICD-10-CM | POA: Diagnosis not present

## 2014-10-06 DIAGNOSIS — D631 Anemia in chronic kidney disease: Secondary | ICD-10-CM | POA: Diagnosis not present

## 2014-10-06 DIAGNOSIS — D509 Iron deficiency anemia, unspecified: Secondary | ICD-10-CM | POA: Diagnosis not present

## 2014-10-06 DIAGNOSIS — Z4931 Encounter for adequacy testing for hemodialysis: Secondary | ICD-10-CM | POA: Diagnosis not present

## 2014-10-06 DIAGNOSIS — N186 End stage renal disease: Secondary | ICD-10-CM | POA: Diagnosis not present

## 2014-10-06 DIAGNOSIS — N2581 Secondary hyperparathyroidism of renal origin: Secondary | ICD-10-CM | POA: Diagnosis not present

## 2014-10-08 DIAGNOSIS — D631 Anemia in chronic kidney disease: Secondary | ICD-10-CM | POA: Diagnosis not present

## 2014-10-08 DIAGNOSIS — D509 Iron deficiency anemia, unspecified: Secondary | ICD-10-CM | POA: Diagnosis not present

## 2014-10-08 DIAGNOSIS — N186 End stage renal disease: Secondary | ICD-10-CM | POA: Diagnosis not present

## 2014-10-08 DIAGNOSIS — N2581 Secondary hyperparathyroidism of renal origin: Secondary | ICD-10-CM | POA: Diagnosis not present

## 2014-10-08 DIAGNOSIS — Z4931 Encounter for adequacy testing for hemodialysis: Secondary | ICD-10-CM | POA: Diagnosis not present

## 2014-10-10 DIAGNOSIS — N186 End stage renal disease: Secondary | ICD-10-CM | POA: Diagnosis not present

## 2014-10-10 DIAGNOSIS — N2581 Secondary hyperparathyroidism of renal origin: Secondary | ICD-10-CM | POA: Diagnosis not present

## 2014-10-10 DIAGNOSIS — D509 Iron deficiency anemia, unspecified: Secondary | ICD-10-CM | POA: Diagnosis not present

## 2014-10-10 DIAGNOSIS — D631 Anemia in chronic kidney disease: Secondary | ICD-10-CM | POA: Diagnosis not present

## 2014-10-10 DIAGNOSIS — Z4931 Encounter for adequacy testing for hemodialysis: Secondary | ICD-10-CM | POA: Diagnosis not present

## 2014-10-12 DIAGNOSIS — N2581 Secondary hyperparathyroidism of renal origin: Secondary | ICD-10-CM | POA: Diagnosis not present

## 2014-10-12 DIAGNOSIS — N186 End stage renal disease: Secondary | ICD-10-CM | POA: Diagnosis not present

## 2014-10-12 DIAGNOSIS — D509 Iron deficiency anemia, unspecified: Secondary | ICD-10-CM | POA: Diagnosis not present

## 2014-10-12 DIAGNOSIS — Z4931 Encounter for adequacy testing for hemodialysis: Secondary | ICD-10-CM | POA: Diagnosis not present

## 2014-10-12 DIAGNOSIS — D631 Anemia in chronic kidney disease: Secondary | ICD-10-CM | POA: Diagnosis not present

## 2014-10-15 DIAGNOSIS — N2581 Secondary hyperparathyroidism of renal origin: Secondary | ICD-10-CM | POA: Diagnosis not present

## 2014-10-15 DIAGNOSIS — Z4931 Encounter for adequacy testing for hemodialysis: Secondary | ICD-10-CM | POA: Diagnosis not present

## 2014-10-15 DIAGNOSIS — D509 Iron deficiency anemia, unspecified: Secondary | ICD-10-CM | POA: Diagnosis not present

## 2014-10-15 DIAGNOSIS — D631 Anemia in chronic kidney disease: Secondary | ICD-10-CM | POA: Diagnosis not present

## 2014-10-15 DIAGNOSIS — N186 End stage renal disease: Secondary | ICD-10-CM | POA: Diagnosis not present

## 2014-10-17 ENCOUNTER — Encounter: Payer: Self-pay | Admitting: Internal Medicine

## 2014-10-17 DIAGNOSIS — Z4931 Encounter for adequacy testing for hemodialysis: Secondary | ICD-10-CM | POA: Diagnosis not present

## 2014-10-17 DIAGNOSIS — D631 Anemia in chronic kidney disease: Secondary | ICD-10-CM | POA: Diagnosis not present

## 2014-10-17 DIAGNOSIS — N186 End stage renal disease: Secondary | ICD-10-CM | POA: Diagnosis not present

## 2014-10-17 DIAGNOSIS — N2581 Secondary hyperparathyroidism of renal origin: Secondary | ICD-10-CM | POA: Diagnosis not present

## 2014-10-17 DIAGNOSIS — D509 Iron deficiency anemia, unspecified: Secondary | ICD-10-CM | POA: Diagnosis not present

## 2014-10-19 DIAGNOSIS — D631 Anemia in chronic kidney disease: Secondary | ICD-10-CM | POA: Diagnosis not present

## 2014-10-19 DIAGNOSIS — N186 End stage renal disease: Secondary | ICD-10-CM | POA: Diagnosis not present

## 2014-10-19 DIAGNOSIS — D509 Iron deficiency anemia, unspecified: Secondary | ICD-10-CM | POA: Diagnosis not present

## 2014-10-19 DIAGNOSIS — Z4931 Encounter for adequacy testing for hemodialysis: Secondary | ICD-10-CM | POA: Diagnosis not present

## 2014-10-19 DIAGNOSIS — N2581 Secondary hyperparathyroidism of renal origin: Secondary | ICD-10-CM | POA: Diagnosis not present

## 2014-10-21 DIAGNOSIS — Z992 Dependence on renal dialysis: Secondary | ICD-10-CM | POA: Diagnosis not present

## 2014-10-21 DIAGNOSIS — N186 End stage renal disease: Secondary | ICD-10-CM | POA: Diagnosis not present

## 2014-10-22 DIAGNOSIS — N186 End stage renal disease: Secondary | ICD-10-CM | POA: Diagnosis not present

## 2014-10-22 DIAGNOSIS — D509 Iron deficiency anemia, unspecified: Secondary | ICD-10-CM | POA: Diagnosis not present

## 2014-10-22 DIAGNOSIS — Z79899 Other long term (current) drug therapy: Secondary | ICD-10-CM | POA: Diagnosis not present

## 2014-10-22 DIAGNOSIS — N2581 Secondary hyperparathyroidism of renal origin: Secondary | ICD-10-CM | POA: Diagnosis not present

## 2014-10-22 DIAGNOSIS — D631 Anemia in chronic kidney disease: Secondary | ICD-10-CM | POA: Diagnosis not present

## 2014-10-23 ENCOUNTER — Other Ambulatory Visit: Payer: Medicare Other

## 2014-10-23 DIAGNOSIS — Z79899 Other long term (current) drug therapy: Secondary | ICD-10-CM

## 2014-10-23 DIAGNOSIS — Z113 Encounter for screening for infections with a predominantly sexual mode of transmission: Secondary | ICD-10-CM

## 2014-10-23 DIAGNOSIS — B20 Human immunodeficiency virus [HIV] disease: Secondary | ICD-10-CM

## 2014-10-23 LAB — CBC
HCT: 36.6 % — ABNORMAL LOW (ref 39.0–52.0)
HEMOGLOBIN: 12.6 g/dL — AB (ref 13.0–17.0)
MCH: 32.8 pg (ref 26.0–34.0)
MCHC: 34.4 g/dL (ref 30.0–36.0)
MCV: 95.3 fL (ref 78.0–100.0)
MPV: 8.5 fL — AB (ref 8.6–12.4)
PLATELETS: 206 10*3/uL (ref 150–400)
RBC: 3.84 MIL/uL — ABNORMAL LOW (ref 4.22–5.81)
RDW: 14.3 % (ref 11.5–15.5)
WBC: 6.9 10*3/uL (ref 4.0–10.5)

## 2014-10-23 LAB — LIPID PANEL
Cholesterol: 98 mg/dL (ref 0–200)
HDL: 23 mg/dL — ABNORMAL LOW (ref >39)
LDL CALC: 49 mg/dL (ref 0–99)
Total CHOL/HDL Ratio: 4.3 Ratio
Triglycerides: 128 mg/dL (ref <150)
VLDL: 26 mg/dL (ref 0–40)

## 2014-10-23 LAB — COMPREHENSIVE METABOLIC PANEL
ALBUMIN: 4.6 g/dL (ref 3.5–5.2)
ALK PHOS: 132 U/L — AB (ref 39–117)
ALT: 8 U/L (ref 0–53)
AST: 10 U/L (ref 0–37)
BUN: 68 mg/dL — ABNORMAL HIGH (ref 6–23)
CO2: 27 meq/L (ref 19–32)
Calcium: 9.2 mg/dL (ref 8.4–10.5)
Chloride: 96 mEq/L (ref 96–112)
Creat: 13.66 mg/dL (ref 0.50–1.35)
GLUCOSE: 120 mg/dL — AB (ref 70–99)
Potassium: 4.2 mEq/L (ref 3.5–5.3)
SODIUM: 137 meq/L (ref 135–145)
Total Bilirubin: 0.5 mg/dL (ref 0.2–1.2)
Total Protein: 8.9 g/dL — ABNORMAL HIGH (ref 6.0–8.3)

## 2014-10-24 DIAGNOSIS — Z79899 Other long term (current) drug therapy: Secondary | ICD-10-CM | POA: Diagnosis not present

## 2014-10-24 DIAGNOSIS — N186 End stage renal disease: Secondary | ICD-10-CM | POA: Diagnosis not present

## 2014-10-24 DIAGNOSIS — N2581 Secondary hyperparathyroidism of renal origin: Secondary | ICD-10-CM | POA: Diagnosis not present

## 2014-10-24 DIAGNOSIS — D631 Anemia in chronic kidney disease: Secondary | ICD-10-CM | POA: Diagnosis not present

## 2014-10-24 DIAGNOSIS — D509 Iron deficiency anemia, unspecified: Secondary | ICD-10-CM | POA: Diagnosis not present

## 2014-10-24 LAB — T-HELPER CELL (CD4) - (RCID CLINIC ONLY)
CD4 % Helper T Cell: 26 % — ABNORMAL LOW (ref 33–55)
CD4 T Cell Abs: 470 /uL (ref 400–2700)

## 2014-10-24 LAB — RPR

## 2014-10-24 LAB — HIV-1 RNA QUANT-NO REFLEX-BLD: HIV 1 RNA Quant: <20 copies/mL (ref <20)

## 2014-10-26 DIAGNOSIS — N2581 Secondary hyperparathyroidism of renal origin: Secondary | ICD-10-CM | POA: Diagnosis not present

## 2014-10-26 DIAGNOSIS — Z79899 Other long term (current) drug therapy: Secondary | ICD-10-CM | POA: Diagnosis not present

## 2014-10-26 DIAGNOSIS — D631 Anemia in chronic kidney disease: Secondary | ICD-10-CM | POA: Diagnosis not present

## 2014-10-26 DIAGNOSIS — N186 End stage renal disease: Secondary | ICD-10-CM | POA: Diagnosis not present

## 2014-10-26 DIAGNOSIS — D509 Iron deficiency anemia, unspecified: Secondary | ICD-10-CM | POA: Diagnosis not present

## 2014-10-28 DIAGNOSIS — Z79899 Other long term (current) drug therapy: Secondary | ICD-10-CM | POA: Diagnosis not present

## 2014-10-28 DIAGNOSIS — D631 Anemia in chronic kidney disease: Secondary | ICD-10-CM | POA: Diagnosis not present

## 2014-10-28 DIAGNOSIS — D509 Iron deficiency anemia, unspecified: Secondary | ICD-10-CM | POA: Diagnosis not present

## 2014-10-28 DIAGNOSIS — N186 End stage renal disease: Secondary | ICD-10-CM | POA: Diagnosis not present

## 2014-10-28 DIAGNOSIS — N2581 Secondary hyperparathyroidism of renal origin: Secondary | ICD-10-CM | POA: Diagnosis not present

## 2014-10-30 DIAGNOSIS — N186 End stage renal disease: Secondary | ICD-10-CM | POA: Diagnosis not present

## 2014-10-30 DIAGNOSIS — N2581 Secondary hyperparathyroidism of renal origin: Secondary | ICD-10-CM | POA: Diagnosis not present

## 2014-10-30 DIAGNOSIS — D509 Iron deficiency anemia, unspecified: Secondary | ICD-10-CM | POA: Diagnosis not present

## 2014-10-30 DIAGNOSIS — D631 Anemia in chronic kidney disease: Secondary | ICD-10-CM | POA: Diagnosis not present

## 2014-10-30 DIAGNOSIS — Z79899 Other long term (current) drug therapy: Secondary | ICD-10-CM | POA: Diagnosis not present

## 2014-10-31 DIAGNOSIS — Z79899 Other long term (current) drug therapy: Secondary | ICD-10-CM | POA: Diagnosis not present

## 2014-10-31 DIAGNOSIS — D509 Iron deficiency anemia, unspecified: Secondary | ICD-10-CM | POA: Diagnosis not present

## 2014-10-31 DIAGNOSIS — N2581 Secondary hyperparathyroidism of renal origin: Secondary | ICD-10-CM | POA: Diagnosis not present

## 2014-10-31 DIAGNOSIS — D631 Anemia in chronic kidney disease: Secondary | ICD-10-CM | POA: Diagnosis not present

## 2014-10-31 DIAGNOSIS — N186 End stage renal disease: Secondary | ICD-10-CM | POA: Diagnosis not present

## 2014-11-02 DIAGNOSIS — Z79899 Other long term (current) drug therapy: Secondary | ICD-10-CM | POA: Diagnosis not present

## 2014-11-02 DIAGNOSIS — D509 Iron deficiency anemia, unspecified: Secondary | ICD-10-CM | POA: Diagnosis not present

## 2014-11-02 DIAGNOSIS — N2581 Secondary hyperparathyroidism of renal origin: Secondary | ICD-10-CM | POA: Diagnosis not present

## 2014-11-02 DIAGNOSIS — N186 End stage renal disease: Secondary | ICD-10-CM | POA: Diagnosis not present

## 2014-11-02 DIAGNOSIS — D631 Anemia in chronic kidney disease: Secondary | ICD-10-CM | POA: Diagnosis not present

## 2014-11-04 DIAGNOSIS — N2581 Secondary hyperparathyroidism of renal origin: Secondary | ICD-10-CM | POA: Diagnosis not present

## 2014-11-04 DIAGNOSIS — D631 Anemia in chronic kidney disease: Secondary | ICD-10-CM | POA: Diagnosis not present

## 2014-11-04 DIAGNOSIS — N186 End stage renal disease: Secondary | ICD-10-CM | POA: Diagnosis not present

## 2014-11-04 DIAGNOSIS — D509 Iron deficiency anemia, unspecified: Secondary | ICD-10-CM | POA: Diagnosis not present

## 2014-11-04 DIAGNOSIS — Z79899 Other long term (current) drug therapy: Secondary | ICD-10-CM | POA: Diagnosis not present

## 2014-11-06 ENCOUNTER — Ambulatory Visit: Payer: Medicare Other | Admitting: Infectious Diseases

## 2014-11-06 DIAGNOSIS — D631 Anemia in chronic kidney disease: Secondary | ICD-10-CM | POA: Diagnosis not present

## 2014-11-06 DIAGNOSIS — D509 Iron deficiency anemia, unspecified: Secondary | ICD-10-CM | POA: Diagnosis not present

## 2014-11-06 DIAGNOSIS — N2581 Secondary hyperparathyroidism of renal origin: Secondary | ICD-10-CM | POA: Diagnosis not present

## 2014-11-06 DIAGNOSIS — N186 End stage renal disease: Secondary | ICD-10-CM | POA: Diagnosis not present

## 2014-11-06 DIAGNOSIS — Z79899 Other long term (current) drug therapy: Secondary | ICD-10-CM | POA: Diagnosis not present

## 2014-11-07 ENCOUNTER — Encounter: Payer: Self-pay | Admitting: Infectious Diseases

## 2014-11-07 ENCOUNTER — Ambulatory Visit (INDEPENDENT_AMBULATORY_CARE_PROVIDER_SITE_OTHER): Payer: Medicare Other | Admitting: Infectious Diseases

## 2014-11-07 VITALS — BP 125/88 | HR 80 | Temp 97.8°F | Ht 71.0 in | Wt 225.0 lb

## 2014-11-07 DIAGNOSIS — B20 Human immunodeficiency virus [HIV] disease: Secondary | ICD-10-CM | POA: Diagnosis not present

## 2014-11-07 DIAGNOSIS — B1911 Unspecified viral hepatitis B with hepatic coma: Secondary | ICD-10-CM

## 2014-11-07 DIAGNOSIS — N186 End stage renal disease: Secondary | ICD-10-CM | POA: Diagnosis not present

## 2014-11-07 DIAGNOSIS — B181 Chronic viral hepatitis B without delta-agent: Secondary | ICD-10-CM

## 2014-11-07 NOTE — Assessment & Plan Note (Addendum)
He is doing very well. Offered to switch him to qday INI ([facility]) but he has no problem with ISN so will leave him on this. His vax are up to date. Offered/refused condoms.  Check hep A Ab at f/u to see if he needs to start vax Will see him back in 6 months.

## 2014-11-07 NOTE — Progress Notes (Signed)
   Subjective:    Patient ID: Jeffrey [name], male    DOB: 1955/10/22, 59 y.o.   MRN: [id number]  HPI 59 yo M with HIV+, Hepatitis B, ESRD, HTN. On 3TC/ISN/TFV. Has had some changes in his BP rx, 154/109 last pm, nl today. Norvasc, hydralazine (qod).   Has been doing well, getting home HD. Has had some increase in his weight. Attributes this to eating habits. No LE edema. Does not make urine.  No problems with fistula.   HIV 1 RNA QUANT (copies/mL)  Date Value  10/23/2014 <20  10/24/2013 <20  04/24/2013 <20   CD4 T CELL ABS  Date Value  10/23/2014 470 /uL  10/24/2013 360 /uL*  04/24/2013 210 cmm*   Review of Systems  Constitutional: Negative for appetite change and unexpected weight change.  Respiratory: Negative for shortness of breath.   Cardiovascular: Negative for chest pain and leg swelling.  Gastrointestinal: Negative for diarrhea and constipation.  Genitourinary: Positive for decreased urine volume.  Neurological: Negative for headaches.      Objective:   Physical Exam  Constitutional: He appears well-developed and well-nourished.  HENT:  Mouth/Throat: No oropharyngeal exudate.  Eyes: EOM are normal. Pupils are equal, round, and reactive to light.  Neck: Neck supple.  Cardiovascular: Normal rate, regular rhythm and normal heart sounds.   Pulmonary/Chest: Effort normal and breath sounds normal.  Abdominal: Soft. Bowel sounds are normal. He exhibits no distension. There is no tenderness.  Musculoskeletal: He exhibits no edema.       Arms: Lymphadenopathy:    He has no cervical adenopathy.          Assessment & Plan:

## 2014-11-07 NOTE — Assessment & Plan Note (Signed)
Will recheck his DNA with next blood draw. LFTs nl

## 2014-11-07 NOTE — Assessment & Plan Note (Signed)
Doing well on home HD. bp has been variable. Greatly appreciate renal f/u

## 2014-11-09 DIAGNOSIS — N186 End stage renal disease: Secondary | ICD-10-CM | POA: Diagnosis not present

## 2014-11-09 DIAGNOSIS — M109 Gout, unspecified: Secondary | ICD-10-CM | POA: Diagnosis not present

## 2014-11-09 DIAGNOSIS — D509 Iron deficiency anemia, unspecified: Secondary | ICD-10-CM | POA: Diagnosis not present

## 2014-11-09 DIAGNOSIS — D631 Anemia in chronic kidney disease: Secondary | ICD-10-CM | POA: Diagnosis not present

## 2014-11-09 DIAGNOSIS — N2581 Secondary hyperparathyroidism of renal origin: Secondary | ICD-10-CM | POA: Diagnosis not present

## 2014-11-09 DIAGNOSIS — Z79899 Other long term (current) drug therapy: Secondary | ICD-10-CM | POA: Diagnosis not present

## 2014-11-10 ENCOUNTER — Other Ambulatory Visit: Payer: Self-pay | Admitting: Infectious Diseases

## 2014-11-10 DIAGNOSIS — B20 Human immunodeficiency virus [HIV] disease: Secondary | ICD-10-CM

## 2014-11-12 DIAGNOSIS — D631 Anemia in chronic kidney disease: Secondary | ICD-10-CM | POA: Diagnosis not present

## 2014-11-12 DIAGNOSIS — N2581 Secondary hyperparathyroidism of renal origin: Secondary | ICD-10-CM | POA: Diagnosis not present

## 2014-11-12 DIAGNOSIS — Z79899 Other long term (current) drug therapy: Secondary | ICD-10-CM | POA: Diagnosis not present

## 2014-11-12 DIAGNOSIS — N186 End stage renal disease: Secondary | ICD-10-CM | POA: Diagnosis not present

## 2014-11-12 DIAGNOSIS — D509 Iron deficiency anemia, unspecified: Secondary | ICD-10-CM | POA: Diagnosis not present

## 2014-11-14 DIAGNOSIS — Z79899 Other long term (current) drug therapy: Secondary | ICD-10-CM | POA: Diagnosis not present

## 2014-11-14 DIAGNOSIS — D631 Anemia in chronic kidney disease: Secondary | ICD-10-CM | POA: Diagnosis not present

## 2014-11-14 DIAGNOSIS — N186 End stage renal disease: Secondary | ICD-10-CM | POA: Diagnosis not present

## 2014-11-14 DIAGNOSIS — N2581 Secondary hyperparathyroidism of renal origin: Secondary | ICD-10-CM | POA: Diagnosis not present

## 2014-11-14 DIAGNOSIS — D509 Iron deficiency anemia, unspecified: Secondary | ICD-10-CM | POA: Diagnosis not present

## 2014-11-18 DIAGNOSIS — D631 Anemia in chronic kidney disease: Secondary | ICD-10-CM | POA: Diagnosis not present

## 2014-11-18 DIAGNOSIS — D509 Iron deficiency anemia, unspecified: Secondary | ICD-10-CM | POA: Diagnosis not present

## 2014-11-18 DIAGNOSIS — N2581 Secondary hyperparathyroidism of renal origin: Secondary | ICD-10-CM | POA: Diagnosis not present

## 2014-11-18 DIAGNOSIS — N186 End stage renal disease: Secondary | ICD-10-CM | POA: Diagnosis not present

## 2014-11-18 DIAGNOSIS — Z79899 Other long term (current) drug therapy: Secondary | ICD-10-CM | POA: Diagnosis not present

## 2014-11-19 DIAGNOSIS — N186 End stage renal disease: Secondary | ICD-10-CM | POA: Diagnosis not present

## 2014-11-19 DIAGNOSIS — Z992 Dependence on renal dialysis: Secondary | ICD-10-CM | POA: Diagnosis not present

## 2014-11-20 DIAGNOSIS — D509 Iron deficiency anemia, unspecified: Secondary | ICD-10-CM | POA: Diagnosis not present

## 2014-11-20 DIAGNOSIS — N2581 Secondary hyperparathyroidism of renal origin: Secondary | ICD-10-CM | POA: Diagnosis not present

## 2014-11-20 DIAGNOSIS — D631 Anemia in chronic kidney disease: Secondary | ICD-10-CM | POA: Diagnosis not present

## 2014-11-20 DIAGNOSIS — Z79899 Other long term (current) drug therapy: Secondary | ICD-10-CM | POA: Diagnosis not present

## 2014-11-20 DIAGNOSIS — N186 End stage renal disease: Secondary | ICD-10-CM | POA: Diagnosis not present

## 2014-11-22 DIAGNOSIS — Z79899 Other long term (current) drug therapy: Secondary | ICD-10-CM | POA: Diagnosis not present

## 2014-11-22 DIAGNOSIS — D509 Iron deficiency anemia, unspecified: Secondary | ICD-10-CM | POA: Diagnosis not present

## 2014-11-22 DIAGNOSIS — D631 Anemia in chronic kidney disease: Secondary | ICD-10-CM | POA: Diagnosis not present

## 2014-11-22 DIAGNOSIS — N2581 Secondary hyperparathyroidism of renal origin: Secondary | ICD-10-CM | POA: Diagnosis not present

## 2014-11-22 DIAGNOSIS — N186 End stage renal disease: Secondary | ICD-10-CM | POA: Diagnosis not present

## 2014-11-25 DIAGNOSIS — N2581 Secondary hyperparathyroidism of renal origin: Secondary | ICD-10-CM | POA: Diagnosis not present

## 2014-11-25 DIAGNOSIS — N186 End stage renal disease: Secondary | ICD-10-CM | POA: Diagnosis not present

## 2014-11-25 DIAGNOSIS — D631 Anemia in chronic kidney disease: Secondary | ICD-10-CM | POA: Diagnosis not present

## 2014-11-25 DIAGNOSIS — D509 Iron deficiency anemia, unspecified: Secondary | ICD-10-CM | POA: Diagnosis not present

## 2014-11-25 DIAGNOSIS — Z79899 Other long term (current) drug therapy: Secondary | ICD-10-CM | POA: Diagnosis not present

## 2014-11-28 DIAGNOSIS — Z79899 Other long term (current) drug therapy: Secondary | ICD-10-CM | POA: Diagnosis not present

## 2014-11-28 DIAGNOSIS — D631 Anemia in chronic kidney disease: Secondary | ICD-10-CM | POA: Diagnosis not present

## 2014-11-28 DIAGNOSIS — N2581 Secondary hyperparathyroidism of renal origin: Secondary | ICD-10-CM | POA: Diagnosis not present

## 2014-11-28 DIAGNOSIS — N186 End stage renal disease: Secondary | ICD-10-CM | POA: Diagnosis not present

## 2014-11-28 DIAGNOSIS — D509 Iron deficiency anemia, unspecified: Secondary | ICD-10-CM | POA: Diagnosis not present

## 2014-11-30 DIAGNOSIS — N2581 Secondary hyperparathyroidism of renal origin: Secondary | ICD-10-CM | POA: Diagnosis not present

## 2014-11-30 DIAGNOSIS — Z79899 Other long term (current) drug therapy: Secondary | ICD-10-CM | POA: Diagnosis not present

## 2014-11-30 DIAGNOSIS — D509 Iron deficiency anemia, unspecified: Secondary | ICD-10-CM | POA: Diagnosis not present

## 2014-11-30 DIAGNOSIS — N186 End stage renal disease: Secondary | ICD-10-CM | POA: Diagnosis not present

## 2014-11-30 DIAGNOSIS — M109 Gout, unspecified: Secondary | ICD-10-CM | POA: Diagnosis not present

## 2014-11-30 DIAGNOSIS — D631 Anemia in chronic kidney disease: Secondary | ICD-10-CM | POA: Diagnosis not present

## 2014-12-03 DIAGNOSIS — D631 Anemia in chronic kidney disease: Secondary | ICD-10-CM | POA: Diagnosis not present

## 2014-12-03 DIAGNOSIS — Z79899 Other long term (current) drug therapy: Secondary | ICD-10-CM | POA: Diagnosis not present

## 2014-12-03 DIAGNOSIS — D509 Iron deficiency anemia, unspecified: Secondary | ICD-10-CM | POA: Diagnosis not present

## 2014-12-03 DIAGNOSIS — N186 End stage renal disease: Secondary | ICD-10-CM | POA: Diagnosis not present

## 2014-12-03 DIAGNOSIS — N2581 Secondary hyperparathyroidism of renal origin: Secondary | ICD-10-CM | POA: Diagnosis not present

## 2014-12-04 DIAGNOSIS — D631 Anemia in chronic kidney disease: Secondary | ICD-10-CM | POA: Diagnosis not present

## 2014-12-04 DIAGNOSIS — Z79899 Other long term (current) drug therapy: Secondary | ICD-10-CM | POA: Diagnosis not present

## 2014-12-04 DIAGNOSIS — D509 Iron deficiency anemia, unspecified: Secondary | ICD-10-CM | POA: Diagnosis not present

## 2014-12-04 DIAGNOSIS — N2581 Secondary hyperparathyroidism of renal origin: Secondary | ICD-10-CM | POA: Diagnosis not present

## 2014-12-04 DIAGNOSIS — N186 End stage renal disease: Secondary | ICD-10-CM | POA: Diagnosis not present

## 2014-12-05 DIAGNOSIS — Z79899 Other long term (current) drug therapy: Secondary | ICD-10-CM | POA: Diagnosis not present

## 2014-12-05 DIAGNOSIS — D509 Iron deficiency anemia, unspecified: Secondary | ICD-10-CM | POA: Diagnosis not present

## 2014-12-05 DIAGNOSIS — N2581 Secondary hyperparathyroidism of renal origin: Secondary | ICD-10-CM | POA: Diagnosis not present

## 2014-12-05 DIAGNOSIS — D631 Anemia in chronic kidney disease: Secondary | ICD-10-CM | POA: Diagnosis not present

## 2014-12-05 DIAGNOSIS — N186 End stage renal disease: Secondary | ICD-10-CM | POA: Diagnosis not present

## 2014-12-05 IMAGING — CR DG CHEST 2V
2 series · 2 of 2 positions shown · non-contrast
Comparison: Chest x-ray from earlier the same day

CLINICAL DATA: Shortness of breath in cough for 2 days

EXAM:
CHEST  2 VIEW

[w chest pa]
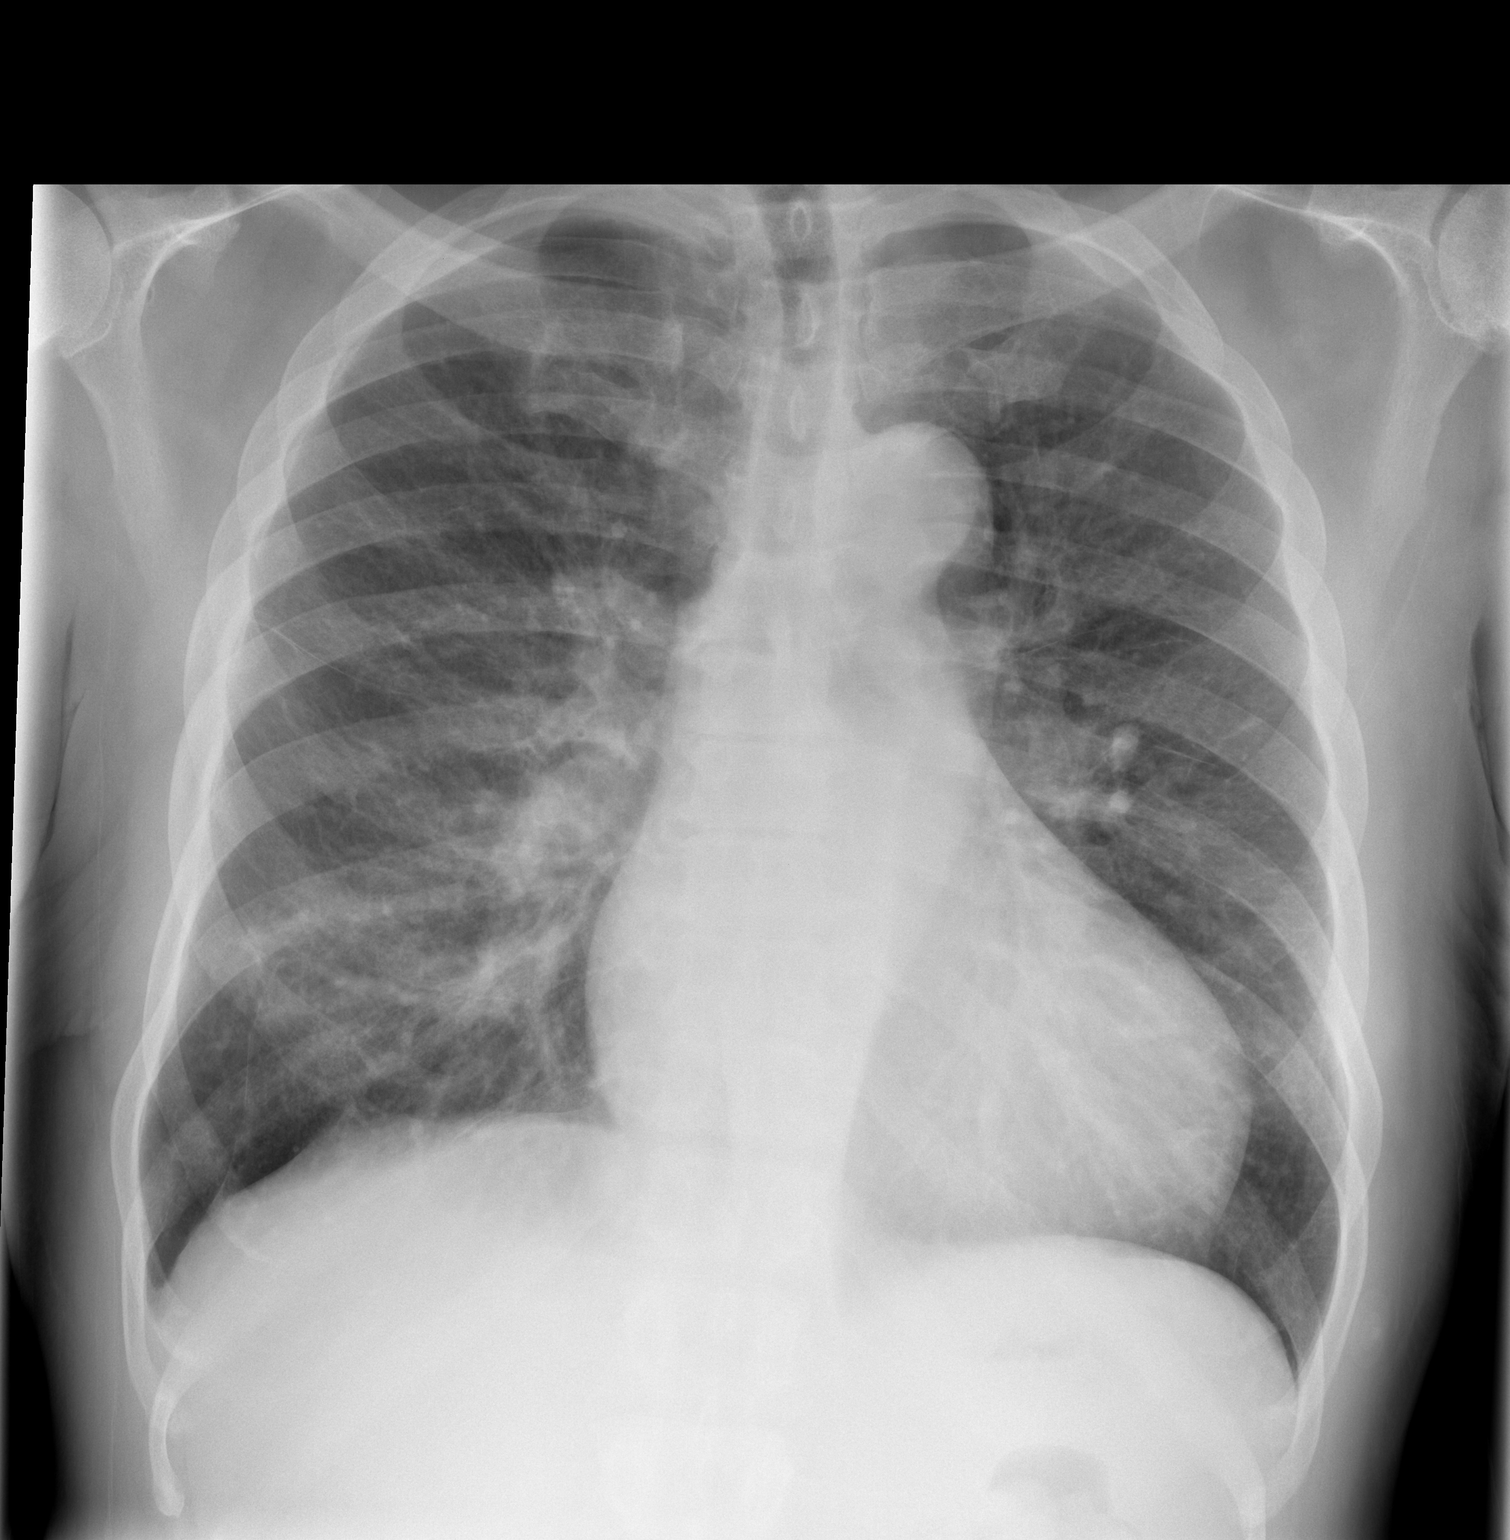

[w chest lat]
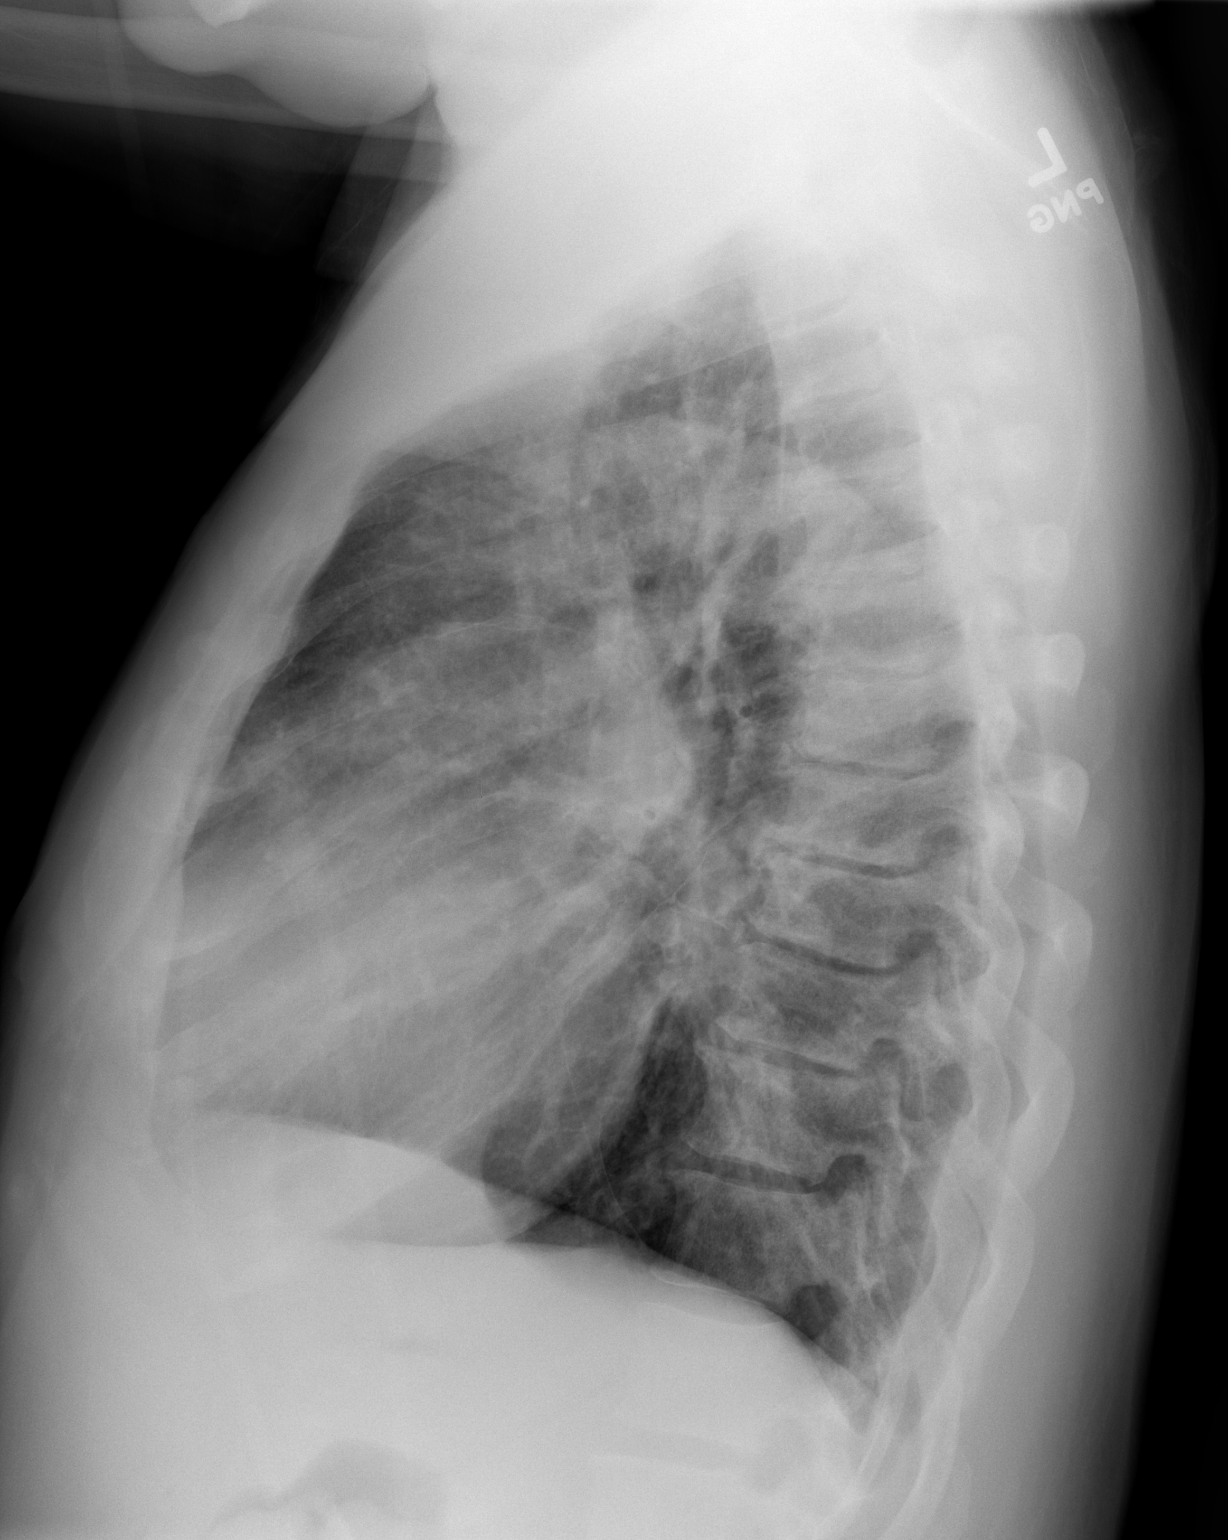

[2 of 2 positions shown; findings below may reference images not displayed]

FINDINGS: Cardiomegaly stable from before. Symmetric bilateral interstitial
opacities. There is linear scarring or accessory fissure at the
right base, stable from prior. No effusion or pneumothorax.

Left glenohumeral osteoarthritis with sclerotic humeral head.
IMPRESSION: Bronchitic changes which could represent developing CHF or atypical
infection.

## 2014-12-05 IMAGING — DX DG CHEST 1V PORT
1 series · 1 of 1 positions shown · non-contrast
Comparison: PA and lateral chest 01/09/2013.

CLINICAL DATA: Hypertension.

EXAM:
PORTABLE CHEST - 1 VIEW

[portable]
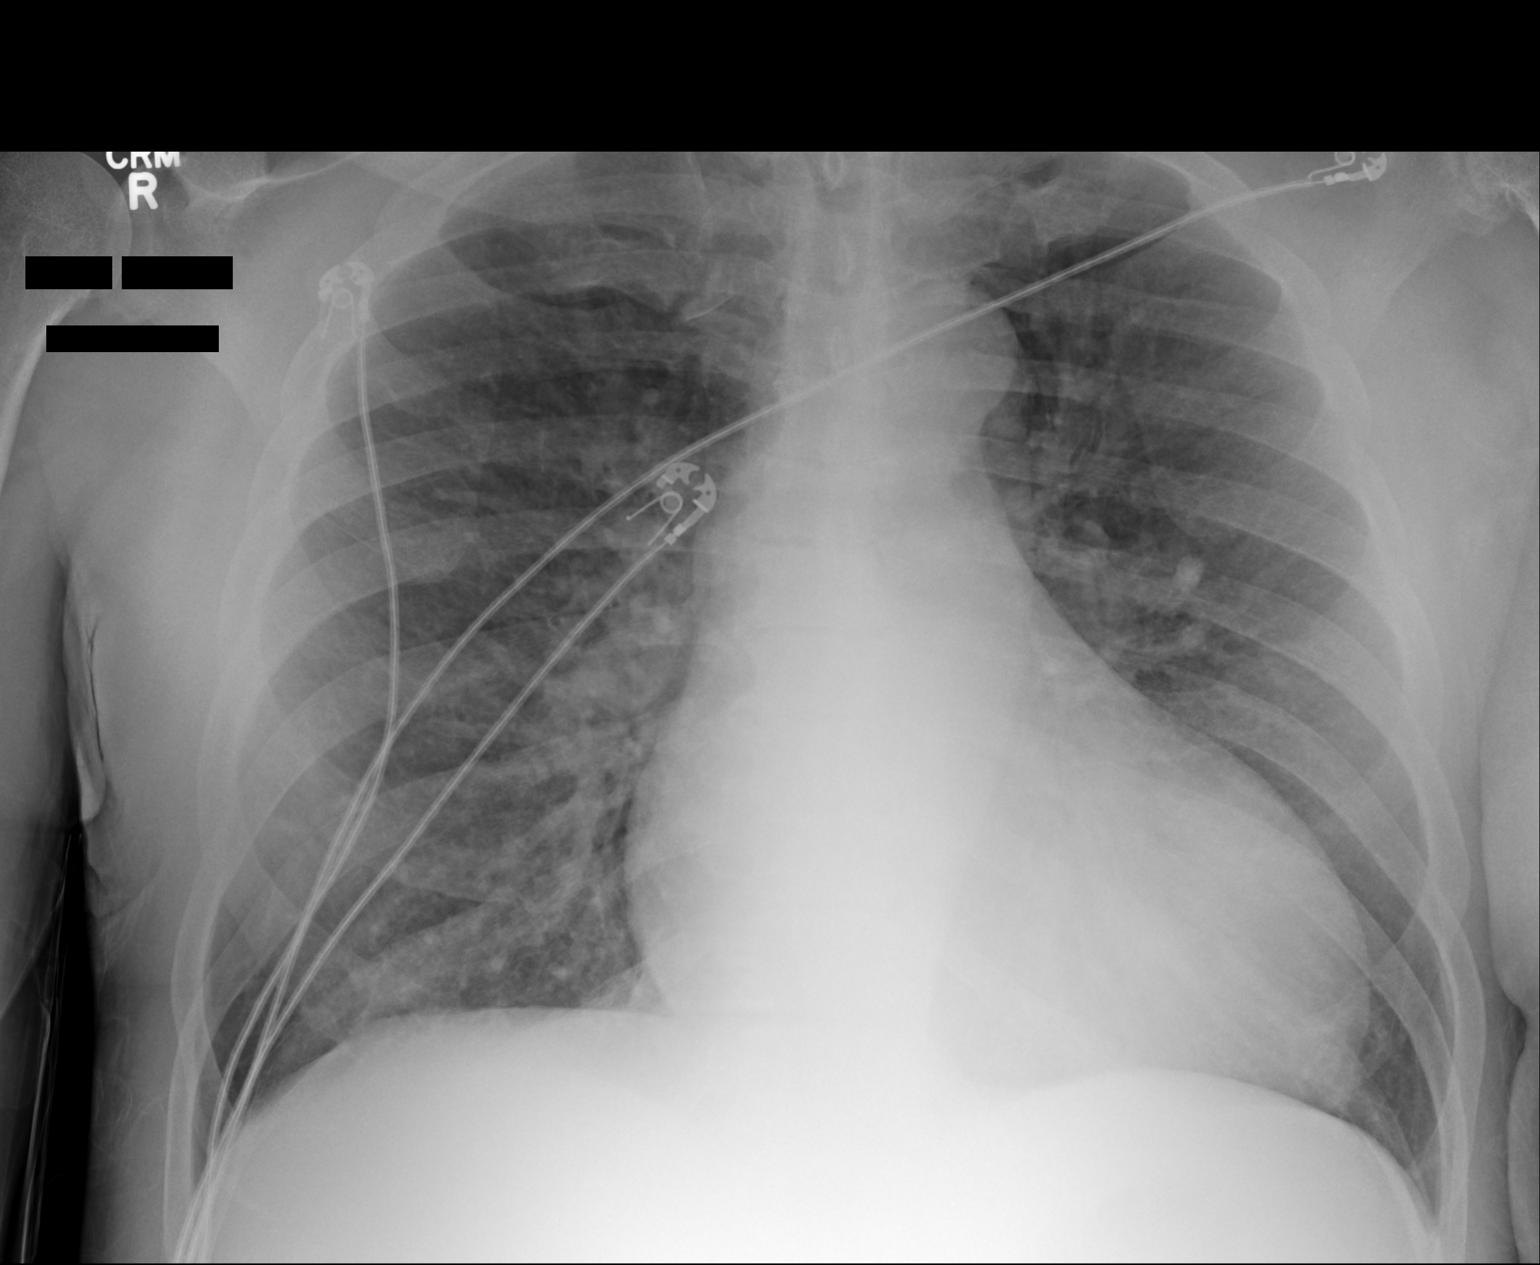

[1 of 1 positions shown; findings below may reference images not displayed]

FINDINGS: There is cardiomegaly and vascular congestion without frank edema.
No consolidative process, pneumothorax or effusion is identified.
IMPRESSION: Cardiomegaly and pulmonary vascular congestion. No acute process.

## 2014-12-09 DIAGNOSIS — D509 Iron deficiency anemia, unspecified: Secondary | ICD-10-CM | POA: Diagnosis not present

## 2014-12-09 DIAGNOSIS — Z79899 Other long term (current) drug therapy: Secondary | ICD-10-CM | POA: Diagnosis not present

## 2014-12-09 DIAGNOSIS — N2581 Secondary hyperparathyroidism of renal origin: Secondary | ICD-10-CM | POA: Diagnosis not present

## 2014-12-09 DIAGNOSIS — N186 End stage renal disease: Secondary | ICD-10-CM | POA: Diagnosis not present

## 2014-12-09 DIAGNOSIS — D631 Anemia in chronic kidney disease: Secondary | ICD-10-CM | POA: Diagnosis not present

## 2014-12-13 DIAGNOSIS — D509 Iron deficiency anemia, unspecified: Secondary | ICD-10-CM | POA: Diagnosis not present

## 2014-12-13 DIAGNOSIS — Z79899 Other long term (current) drug therapy: Secondary | ICD-10-CM | POA: Diagnosis not present

## 2014-12-13 DIAGNOSIS — D631 Anemia in chronic kidney disease: Secondary | ICD-10-CM | POA: Diagnosis not present

## 2014-12-13 DIAGNOSIS — N186 End stage renal disease: Secondary | ICD-10-CM | POA: Diagnosis not present

## 2014-12-13 DIAGNOSIS — N2581 Secondary hyperparathyroidism of renal origin: Secondary | ICD-10-CM | POA: Diagnosis not present

## 2014-12-17 DIAGNOSIS — N186 End stage renal disease: Secondary | ICD-10-CM | POA: Diagnosis not present

## 2014-12-17 DIAGNOSIS — Z79899 Other long term (current) drug therapy: Secondary | ICD-10-CM | POA: Diagnosis not present

## 2014-12-17 DIAGNOSIS — D509 Iron deficiency anemia, unspecified: Secondary | ICD-10-CM | POA: Diagnosis not present

## 2014-12-17 DIAGNOSIS — D631 Anemia in chronic kidney disease: Secondary | ICD-10-CM | POA: Diagnosis not present

## 2014-12-17 DIAGNOSIS — N2581 Secondary hyperparathyroidism of renal origin: Secondary | ICD-10-CM | POA: Diagnosis not present

## 2014-12-19 DIAGNOSIS — N2581 Secondary hyperparathyroidism of renal origin: Secondary | ICD-10-CM | POA: Diagnosis not present

## 2014-12-19 DIAGNOSIS — N186 End stage renal disease: Secondary | ICD-10-CM | POA: Diagnosis not present

## 2014-12-19 DIAGNOSIS — D631 Anemia in chronic kidney disease: Secondary | ICD-10-CM | POA: Diagnosis not present

## 2014-12-19 DIAGNOSIS — Z79899 Other long term (current) drug therapy: Secondary | ICD-10-CM | POA: Diagnosis not present

## 2014-12-19 DIAGNOSIS — D509 Iron deficiency anemia, unspecified: Secondary | ICD-10-CM | POA: Diagnosis not present

## 2014-12-20 DIAGNOSIS — N186 End stage renal disease: Secondary | ICD-10-CM | POA: Diagnosis not present

## 2014-12-20 DIAGNOSIS — Z992 Dependence on renal dialysis: Secondary | ICD-10-CM | POA: Diagnosis not present

## 2014-12-22 DIAGNOSIS — N2581 Secondary hyperparathyroidism of renal origin: Secondary | ICD-10-CM | POA: Diagnosis not present

## 2014-12-22 DIAGNOSIS — D631 Anemia in chronic kidney disease: Secondary | ICD-10-CM | POA: Diagnosis not present

## 2014-12-22 DIAGNOSIS — N186 End stage renal disease: Secondary | ICD-10-CM | POA: Diagnosis not present

## 2014-12-22 DIAGNOSIS — D509 Iron deficiency anemia, unspecified: Secondary | ICD-10-CM | POA: Diagnosis not present

## 2014-12-26 DIAGNOSIS — D509 Iron deficiency anemia, unspecified: Secondary | ICD-10-CM | POA: Diagnosis not present

## 2014-12-26 DIAGNOSIS — N2581 Secondary hyperparathyroidism of renal origin: Secondary | ICD-10-CM | POA: Diagnosis not present

## 2014-12-26 DIAGNOSIS — D631 Anemia in chronic kidney disease: Secondary | ICD-10-CM | POA: Diagnosis not present

## 2014-12-26 DIAGNOSIS — N186 End stage renal disease: Secondary | ICD-10-CM | POA: Diagnosis not present

## 2014-12-29 DIAGNOSIS — N186 End stage renal disease: Secondary | ICD-10-CM | POA: Diagnosis not present

## 2014-12-29 DIAGNOSIS — D509 Iron deficiency anemia, unspecified: Secondary | ICD-10-CM | POA: Diagnosis not present

## 2014-12-29 DIAGNOSIS — N2581 Secondary hyperparathyroidism of renal origin: Secondary | ICD-10-CM | POA: Diagnosis not present

## 2014-12-29 DIAGNOSIS — D631 Anemia in chronic kidney disease: Secondary | ICD-10-CM | POA: Diagnosis not present

## 2014-12-31 DIAGNOSIS — N186 End stage renal disease: Secondary | ICD-10-CM | POA: Diagnosis not present

## 2014-12-31 DIAGNOSIS — D631 Anemia in chronic kidney disease: Secondary | ICD-10-CM | POA: Diagnosis not present

## 2014-12-31 DIAGNOSIS — N2581 Secondary hyperparathyroidism of renal origin: Secondary | ICD-10-CM | POA: Diagnosis not present

## 2014-12-31 DIAGNOSIS — D509 Iron deficiency anemia, unspecified: Secondary | ICD-10-CM | POA: Diagnosis not present

## 2015-01-02 DIAGNOSIS — M109 Gout, unspecified: Secondary | ICD-10-CM | POA: Diagnosis not present

## 2015-01-02 DIAGNOSIS — N186 End stage renal disease: Secondary | ICD-10-CM | POA: Diagnosis not present

## 2015-01-02 DIAGNOSIS — E785 Hyperlipidemia, unspecified: Secondary | ICD-10-CM | POA: Diagnosis not present

## 2015-01-02 DIAGNOSIS — N2581 Secondary hyperparathyroidism of renal origin: Secondary | ICD-10-CM | POA: Diagnosis not present

## 2015-01-02 DIAGNOSIS — D631 Anemia in chronic kidney disease: Secondary | ICD-10-CM | POA: Diagnosis not present

## 2015-01-02 DIAGNOSIS — D509 Iron deficiency anemia, unspecified: Secondary | ICD-10-CM | POA: Diagnosis not present

## 2015-01-03 DIAGNOSIS — D509 Iron deficiency anemia, unspecified: Secondary | ICD-10-CM | POA: Diagnosis not present

## 2015-01-03 DIAGNOSIS — N186 End stage renal disease: Secondary | ICD-10-CM | POA: Diagnosis not present

## 2015-01-03 DIAGNOSIS — D631 Anemia in chronic kidney disease: Secondary | ICD-10-CM | POA: Diagnosis not present

## 2015-01-03 DIAGNOSIS — N2581 Secondary hyperparathyroidism of renal origin: Secondary | ICD-10-CM | POA: Diagnosis not present

## 2015-01-05 DIAGNOSIS — N186 End stage renal disease: Secondary | ICD-10-CM | POA: Diagnosis not present

## 2015-01-05 DIAGNOSIS — D631 Anemia in chronic kidney disease: Secondary | ICD-10-CM | POA: Diagnosis not present

## 2015-01-05 DIAGNOSIS — N2581 Secondary hyperparathyroidism of renal origin: Secondary | ICD-10-CM | POA: Diagnosis not present

## 2015-01-05 DIAGNOSIS — D509 Iron deficiency anemia, unspecified: Secondary | ICD-10-CM | POA: Diagnosis not present

## 2015-01-08 DIAGNOSIS — N2581 Secondary hyperparathyroidism of renal origin: Secondary | ICD-10-CM | POA: Diagnosis not present

## 2015-01-08 DIAGNOSIS — D509 Iron deficiency anemia, unspecified: Secondary | ICD-10-CM | POA: Diagnosis not present

## 2015-01-08 DIAGNOSIS — N186 End stage renal disease: Secondary | ICD-10-CM | POA: Diagnosis not present

## 2015-01-08 DIAGNOSIS — D631 Anemia in chronic kidney disease: Secondary | ICD-10-CM | POA: Diagnosis not present

## 2015-01-12 DIAGNOSIS — D509 Iron deficiency anemia, unspecified: Secondary | ICD-10-CM | POA: Diagnosis not present

## 2015-01-12 DIAGNOSIS — N186 End stage renal disease: Secondary | ICD-10-CM | POA: Diagnosis not present

## 2015-01-12 DIAGNOSIS — D631 Anemia in chronic kidney disease: Secondary | ICD-10-CM | POA: Diagnosis not present

## 2015-01-12 DIAGNOSIS — N2581 Secondary hyperparathyroidism of renal origin: Secondary | ICD-10-CM | POA: Diagnosis not present

## 2015-01-14 DIAGNOSIS — N186 End stage renal disease: Secondary | ICD-10-CM | POA: Diagnosis not present

## 2015-01-14 DIAGNOSIS — N2581 Secondary hyperparathyroidism of renal origin: Secondary | ICD-10-CM | POA: Diagnosis not present

## 2015-01-14 DIAGNOSIS — D509 Iron deficiency anemia, unspecified: Secondary | ICD-10-CM | POA: Diagnosis not present

## 2015-01-14 DIAGNOSIS — D631 Anemia in chronic kidney disease: Secondary | ICD-10-CM | POA: Diagnosis not present

## 2015-01-15 DIAGNOSIS — D509 Iron deficiency anemia, unspecified: Secondary | ICD-10-CM | POA: Diagnosis not present

## 2015-01-15 DIAGNOSIS — N186 End stage renal disease: Secondary | ICD-10-CM | POA: Diagnosis not present

## 2015-01-15 DIAGNOSIS — D631 Anemia in chronic kidney disease: Secondary | ICD-10-CM | POA: Diagnosis not present

## 2015-01-15 DIAGNOSIS — N2581 Secondary hyperparathyroidism of renal origin: Secondary | ICD-10-CM | POA: Diagnosis not present

## 2015-01-16 DIAGNOSIS — D631 Anemia in chronic kidney disease: Secondary | ICD-10-CM | POA: Diagnosis not present

## 2015-01-16 DIAGNOSIS — N186 End stage renal disease: Secondary | ICD-10-CM | POA: Diagnosis not present

## 2015-01-16 DIAGNOSIS — N2581 Secondary hyperparathyroidism of renal origin: Secondary | ICD-10-CM | POA: Diagnosis not present

## 2015-01-16 DIAGNOSIS — D509 Iron deficiency anemia, unspecified: Secondary | ICD-10-CM | POA: Diagnosis not present

## 2015-01-18 DIAGNOSIS — N186 End stage renal disease: Secondary | ICD-10-CM | POA: Diagnosis not present

## 2015-01-18 DIAGNOSIS — N2581 Secondary hyperparathyroidism of renal origin: Secondary | ICD-10-CM | POA: Diagnosis not present

## 2015-01-18 DIAGNOSIS — D509 Iron deficiency anemia, unspecified: Secondary | ICD-10-CM | POA: Diagnosis not present

## 2015-01-18 DIAGNOSIS — D631 Anemia in chronic kidney disease: Secondary | ICD-10-CM | POA: Diagnosis not present

## 2015-01-21 DIAGNOSIS — Z79899 Other long term (current) drug therapy: Secondary | ICD-10-CM | POA: Diagnosis not present

## 2015-01-21 DIAGNOSIS — D631 Anemia in chronic kidney disease: Secondary | ICD-10-CM | POA: Diagnosis not present

## 2015-01-21 DIAGNOSIS — E213 Hyperparathyroidism, unspecified: Secondary | ICD-10-CM | POA: Diagnosis not present

## 2015-01-21 DIAGNOSIS — D509 Iron deficiency anemia, unspecified: Secondary | ICD-10-CM | POA: Diagnosis not present

## 2015-01-21 DIAGNOSIS — N186 End stage renal disease: Secondary | ICD-10-CM | POA: Diagnosis not present

## 2015-01-23 DIAGNOSIS — D631 Anemia in chronic kidney disease: Secondary | ICD-10-CM | POA: Diagnosis not present

## 2015-01-23 DIAGNOSIS — D509 Iron deficiency anemia, unspecified: Secondary | ICD-10-CM | POA: Diagnosis not present

## 2015-01-23 DIAGNOSIS — Z79899 Other long term (current) drug therapy: Secondary | ICD-10-CM | POA: Diagnosis not present

## 2015-01-23 DIAGNOSIS — E213 Hyperparathyroidism, unspecified: Secondary | ICD-10-CM | POA: Diagnosis not present

## 2015-01-23 DIAGNOSIS — N186 End stage renal disease: Secondary | ICD-10-CM | POA: Diagnosis not present

## 2015-01-25 DIAGNOSIS — Z79899 Other long term (current) drug therapy: Secondary | ICD-10-CM | POA: Diagnosis not present

## 2015-01-25 DIAGNOSIS — D631 Anemia in chronic kidney disease: Secondary | ICD-10-CM | POA: Diagnosis not present

## 2015-01-25 DIAGNOSIS — E213 Hyperparathyroidism, unspecified: Secondary | ICD-10-CM | POA: Diagnosis not present

## 2015-01-25 DIAGNOSIS — N186 End stage renal disease: Secondary | ICD-10-CM | POA: Diagnosis not present

## 2015-01-25 DIAGNOSIS — D509 Iron deficiency anemia, unspecified: Secondary | ICD-10-CM | POA: Diagnosis not present

## 2015-01-27 DIAGNOSIS — N186 End stage renal disease: Secondary | ICD-10-CM | POA: Diagnosis not present

## 2015-01-27 DIAGNOSIS — D509 Iron deficiency anemia, unspecified: Secondary | ICD-10-CM | POA: Diagnosis not present

## 2015-01-27 DIAGNOSIS — D631 Anemia in chronic kidney disease: Secondary | ICD-10-CM | POA: Diagnosis not present

## 2015-01-27 DIAGNOSIS — E213 Hyperparathyroidism, unspecified: Secondary | ICD-10-CM | POA: Diagnosis not present

## 2015-01-27 DIAGNOSIS — Z79899 Other long term (current) drug therapy: Secondary | ICD-10-CM | POA: Diagnosis not present

## 2015-01-29 DIAGNOSIS — Z79899 Other long term (current) drug therapy: Secondary | ICD-10-CM | POA: Diagnosis not present

## 2015-01-29 DIAGNOSIS — D631 Anemia in chronic kidney disease: Secondary | ICD-10-CM | POA: Diagnosis not present

## 2015-01-29 DIAGNOSIS — D509 Iron deficiency anemia, unspecified: Secondary | ICD-10-CM | POA: Diagnosis not present

## 2015-01-29 DIAGNOSIS — E213 Hyperparathyroidism, unspecified: Secondary | ICD-10-CM | POA: Diagnosis not present

## 2015-01-29 DIAGNOSIS — N186 End stage renal disease: Secondary | ICD-10-CM | POA: Diagnosis not present

## 2015-01-31 DIAGNOSIS — D631 Anemia in chronic kidney disease: Secondary | ICD-10-CM | POA: Diagnosis not present

## 2015-01-31 DIAGNOSIS — Z79899 Other long term (current) drug therapy: Secondary | ICD-10-CM | POA: Diagnosis not present

## 2015-01-31 DIAGNOSIS — N186 End stage renal disease: Secondary | ICD-10-CM | POA: Diagnosis not present

## 2015-01-31 DIAGNOSIS — D509 Iron deficiency anemia, unspecified: Secondary | ICD-10-CM | POA: Diagnosis not present

## 2015-01-31 DIAGNOSIS — E213 Hyperparathyroidism, unspecified: Secondary | ICD-10-CM | POA: Diagnosis not present

## 2015-02-01 DIAGNOSIS — D631 Anemia in chronic kidney disease: Secondary | ICD-10-CM | POA: Diagnosis not present

## 2015-02-01 DIAGNOSIS — E213 Hyperparathyroidism, unspecified: Secondary | ICD-10-CM | POA: Diagnosis not present

## 2015-02-01 DIAGNOSIS — N186 End stage renal disease: Secondary | ICD-10-CM | POA: Diagnosis not present

## 2015-02-01 DIAGNOSIS — Z79899 Other long term (current) drug therapy: Secondary | ICD-10-CM | POA: Diagnosis not present

## 2015-02-01 DIAGNOSIS — M109 Gout, unspecified: Secondary | ICD-10-CM | POA: Diagnosis not present

## 2015-02-01 DIAGNOSIS — D509 Iron deficiency anemia, unspecified: Secondary | ICD-10-CM | POA: Diagnosis not present

## 2015-02-04 DIAGNOSIS — E213 Hyperparathyroidism, unspecified: Secondary | ICD-10-CM | POA: Diagnosis not present

## 2015-02-04 DIAGNOSIS — Z79899 Other long term (current) drug therapy: Secondary | ICD-10-CM | POA: Diagnosis not present

## 2015-02-04 DIAGNOSIS — N186 End stage renal disease: Secondary | ICD-10-CM | POA: Diagnosis not present

## 2015-02-04 DIAGNOSIS — D509 Iron deficiency anemia, unspecified: Secondary | ICD-10-CM | POA: Diagnosis not present

## 2015-02-04 DIAGNOSIS — D631 Anemia in chronic kidney disease: Secondary | ICD-10-CM | POA: Diagnosis not present

## 2015-02-05 DIAGNOSIS — D509 Iron deficiency anemia, unspecified: Secondary | ICD-10-CM | POA: Diagnosis not present

## 2015-02-05 DIAGNOSIS — E213 Hyperparathyroidism, unspecified: Secondary | ICD-10-CM | POA: Diagnosis not present

## 2015-02-05 DIAGNOSIS — Z79899 Other long term (current) drug therapy: Secondary | ICD-10-CM | POA: Diagnosis not present

## 2015-02-05 DIAGNOSIS — N186 End stage renal disease: Secondary | ICD-10-CM | POA: Diagnosis not present

## 2015-02-05 DIAGNOSIS — D631 Anemia in chronic kidney disease: Secondary | ICD-10-CM | POA: Diagnosis not present

## 2015-02-07 DIAGNOSIS — D509 Iron deficiency anemia, unspecified: Secondary | ICD-10-CM | POA: Diagnosis not present

## 2015-02-07 DIAGNOSIS — D631 Anemia in chronic kidney disease: Secondary | ICD-10-CM | POA: Diagnosis not present

## 2015-02-07 DIAGNOSIS — Z79899 Other long term (current) drug therapy: Secondary | ICD-10-CM | POA: Diagnosis not present

## 2015-02-07 DIAGNOSIS — E213 Hyperparathyroidism, unspecified: Secondary | ICD-10-CM | POA: Diagnosis not present

## 2015-02-07 DIAGNOSIS — N186 End stage renal disease: Secondary | ICD-10-CM | POA: Diagnosis not present

## 2015-02-09 DIAGNOSIS — D509 Iron deficiency anemia, unspecified: Secondary | ICD-10-CM | POA: Diagnosis not present

## 2015-02-09 DIAGNOSIS — Z79899 Other long term (current) drug therapy: Secondary | ICD-10-CM | POA: Diagnosis not present

## 2015-02-09 DIAGNOSIS — D631 Anemia in chronic kidney disease: Secondary | ICD-10-CM | POA: Diagnosis not present

## 2015-02-09 DIAGNOSIS — N186 End stage renal disease: Secondary | ICD-10-CM | POA: Diagnosis not present

## 2015-02-09 DIAGNOSIS — E213 Hyperparathyroidism, unspecified: Secondary | ICD-10-CM | POA: Diagnosis not present

## 2015-02-10 DIAGNOSIS — Z79899 Other long term (current) drug therapy: Secondary | ICD-10-CM | POA: Diagnosis not present

## 2015-02-10 DIAGNOSIS — D631 Anemia in chronic kidney disease: Secondary | ICD-10-CM | POA: Diagnosis not present

## 2015-02-10 DIAGNOSIS — E213 Hyperparathyroidism, unspecified: Secondary | ICD-10-CM | POA: Diagnosis not present

## 2015-02-10 DIAGNOSIS — N186 End stage renal disease: Secondary | ICD-10-CM | POA: Diagnosis not present

## 2015-02-10 DIAGNOSIS — D509 Iron deficiency anemia, unspecified: Secondary | ICD-10-CM | POA: Diagnosis not present

## 2015-02-12 DIAGNOSIS — D631 Anemia in chronic kidney disease: Secondary | ICD-10-CM | POA: Diagnosis not present

## 2015-02-12 DIAGNOSIS — N186 End stage renal disease: Secondary | ICD-10-CM | POA: Diagnosis not present

## 2015-02-12 DIAGNOSIS — E213 Hyperparathyroidism, unspecified: Secondary | ICD-10-CM | POA: Diagnosis not present

## 2015-02-12 DIAGNOSIS — Z79899 Other long term (current) drug therapy: Secondary | ICD-10-CM | POA: Diagnosis not present

## 2015-02-12 DIAGNOSIS — D509 Iron deficiency anemia, unspecified: Secondary | ICD-10-CM | POA: Diagnosis not present

## 2015-02-14 DIAGNOSIS — Z79899 Other long term (current) drug therapy: Secondary | ICD-10-CM | POA: Diagnosis not present

## 2015-02-14 DIAGNOSIS — N186 End stage renal disease: Secondary | ICD-10-CM | POA: Diagnosis not present

## 2015-02-14 DIAGNOSIS — D509 Iron deficiency anemia, unspecified: Secondary | ICD-10-CM | POA: Diagnosis not present

## 2015-02-14 DIAGNOSIS — D631 Anemia in chronic kidney disease: Secondary | ICD-10-CM | POA: Diagnosis not present

## 2015-02-14 DIAGNOSIS — E213 Hyperparathyroidism, unspecified: Secondary | ICD-10-CM | POA: Diagnosis not present

## 2015-02-16 DIAGNOSIS — N186 End stage renal disease: Secondary | ICD-10-CM | POA: Diagnosis not present

## 2015-02-16 DIAGNOSIS — E213 Hyperparathyroidism, unspecified: Secondary | ICD-10-CM | POA: Diagnosis not present

## 2015-02-16 DIAGNOSIS — Z79899 Other long term (current) drug therapy: Secondary | ICD-10-CM | POA: Diagnosis not present

## 2015-02-16 DIAGNOSIS — D509 Iron deficiency anemia, unspecified: Secondary | ICD-10-CM | POA: Diagnosis not present

## 2015-02-16 DIAGNOSIS — D631 Anemia in chronic kidney disease: Secondary | ICD-10-CM | POA: Diagnosis not present

## 2015-02-17 DIAGNOSIS — D509 Iron deficiency anemia, unspecified: Secondary | ICD-10-CM | POA: Diagnosis not present

## 2015-02-17 DIAGNOSIS — Z79899 Other long term (current) drug therapy: Secondary | ICD-10-CM | POA: Diagnosis not present

## 2015-02-17 DIAGNOSIS — N186 End stage renal disease: Secondary | ICD-10-CM | POA: Diagnosis not present

## 2015-02-17 DIAGNOSIS — E213 Hyperparathyroidism, unspecified: Secondary | ICD-10-CM | POA: Diagnosis not present

## 2015-02-17 DIAGNOSIS — D631 Anemia in chronic kidney disease: Secondary | ICD-10-CM | POA: Diagnosis not present

## 2015-02-19 DIAGNOSIS — E213 Hyperparathyroidism, unspecified: Secondary | ICD-10-CM | POA: Diagnosis not present

## 2015-02-19 DIAGNOSIS — Z992 Dependence on renal dialysis: Secondary | ICD-10-CM | POA: Diagnosis not present

## 2015-02-19 DIAGNOSIS — I120 Hypertensive chronic kidney disease with stage 5 chronic kidney disease or end stage renal disease: Secondary | ICD-10-CM | POA: Diagnosis not present

## 2015-02-19 DIAGNOSIS — Z79899 Other long term (current) drug therapy: Secondary | ICD-10-CM | POA: Diagnosis not present

## 2015-02-19 DIAGNOSIS — N186 End stage renal disease: Secondary | ICD-10-CM | POA: Diagnosis not present

## 2015-02-19 DIAGNOSIS — D509 Iron deficiency anemia, unspecified: Secondary | ICD-10-CM | POA: Diagnosis not present

## 2015-02-19 DIAGNOSIS — D631 Anemia in chronic kidney disease: Secondary | ICD-10-CM | POA: Diagnosis not present

## 2015-02-20 DIAGNOSIS — N186 End stage renal disease: Secondary | ICD-10-CM | POA: Diagnosis not present

## 2015-02-20 DIAGNOSIS — Z79899 Other long term (current) drug therapy: Secondary | ICD-10-CM | POA: Diagnosis not present

## 2015-02-20 DIAGNOSIS — D509 Iron deficiency anemia, unspecified: Secondary | ICD-10-CM | POA: Diagnosis not present

## 2015-02-20 DIAGNOSIS — D631 Anemia in chronic kidney disease: Secondary | ICD-10-CM | POA: Diagnosis not present

## 2015-02-22 DIAGNOSIS — D509 Iron deficiency anemia, unspecified: Secondary | ICD-10-CM | POA: Diagnosis not present

## 2015-02-22 DIAGNOSIS — Z79899 Other long term (current) drug therapy: Secondary | ICD-10-CM | POA: Diagnosis not present

## 2015-02-22 DIAGNOSIS — D631 Anemia in chronic kidney disease: Secondary | ICD-10-CM | POA: Diagnosis not present

## 2015-02-22 DIAGNOSIS — N186 End stage renal disease: Secondary | ICD-10-CM | POA: Diagnosis not present

## 2015-02-24 DIAGNOSIS — N186 End stage renal disease: Secondary | ICD-10-CM | POA: Diagnosis not present

## 2015-02-24 DIAGNOSIS — Z79899 Other long term (current) drug therapy: Secondary | ICD-10-CM | POA: Diagnosis not present

## 2015-02-24 DIAGNOSIS — D631 Anemia in chronic kidney disease: Secondary | ICD-10-CM | POA: Diagnosis not present

## 2015-02-24 DIAGNOSIS — D509 Iron deficiency anemia, unspecified: Secondary | ICD-10-CM | POA: Diagnosis not present

## 2015-02-26 DIAGNOSIS — N186 End stage renal disease: Secondary | ICD-10-CM | POA: Diagnosis not present

## 2015-02-26 DIAGNOSIS — D509 Iron deficiency anemia, unspecified: Secondary | ICD-10-CM | POA: Diagnosis not present

## 2015-02-26 DIAGNOSIS — D631 Anemia in chronic kidney disease: Secondary | ICD-10-CM | POA: Diagnosis not present

## 2015-02-26 DIAGNOSIS — Z79899 Other long term (current) drug therapy: Secondary | ICD-10-CM | POA: Diagnosis not present

## 2015-02-27 DIAGNOSIS — Z79899 Other long term (current) drug therapy: Secondary | ICD-10-CM | POA: Diagnosis not present

## 2015-02-27 DIAGNOSIS — D631 Anemia in chronic kidney disease: Secondary | ICD-10-CM | POA: Diagnosis not present

## 2015-02-27 DIAGNOSIS — N186 End stage renal disease: Secondary | ICD-10-CM | POA: Diagnosis not present

## 2015-02-27 DIAGNOSIS — D509 Iron deficiency anemia, unspecified: Secondary | ICD-10-CM | POA: Diagnosis not present

## 2015-02-28 DIAGNOSIS — D631 Anemia in chronic kidney disease: Secondary | ICD-10-CM | POA: Diagnosis not present

## 2015-02-28 DIAGNOSIS — D509 Iron deficiency anemia, unspecified: Secondary | ICD-10-CM | POA: Diagnosis not present

## 2015-02-28 DIAGNOSIS — N186 End stage renal disease: Secondary | ICD-10-CM | POA: Diagnosis not present

## 2015-02-28 DIAGNOSIS — Z79899 Other long term (current) drug therapy: Secondary | ICD-10-CM | POA: Diagnosis not present

## 2015-02-28 DIAGNOSIS — M109 Gout, unspecified: Secondary | ICD-10-CM | POA: Diagnosis not present

## 2015-03-02 DIAGNOSIS — D631 Anemia in chronic kidney disease: Secondary | ICD-10-CM | POA: Diagnosis not present

## 2015-03-02 DIAGNOSIS — N186 End stage renal disease: Secondary | ICD-10-CM | POA: Diagnosis not present

## 2015-03-02 DIAGNOSIS — Z79899 Other long term (current) drug therapy: Secondary | ICD-10-CM | POA: Diagnosis not present

## 2015-03-02 DIAGNOSIS — D509 Iron deficiency anemia, unspecified: Secondary | ICD-10-CM | POA: Diagnosis not present

## 2015-03-04 DIAGNOSIS — Z79899 Other long term (current) drug therapy: Secondary | ICD-10-CM | POA: Diagnosis not present

## 2015-03-04 DIAGNOSIS — D631 Anemia in chronic kidney disease: Secondary | ICD-10-CM | POA: Diagnosis not present

## 2015-03-04 DIAGNOSIS — D509 Iron deficiency anemia, unspecified: Secondary | ICD-10-CM | POA: Diagnosis not present

## 2015-03-04 DIAGNOSIS — N186 End stage renal disease: Secondary | ICD-10-CM | POA: Diagnosis not present

## 2015-03-07 DIAGNOSIS — D509 Iron deficiency anemia, unspecified: Secondary | ICD-10-CM | POA: Diagnosis not present

## 2015-03-07 DIAGNOSIS — D631 Anemia in chronic kidney disease: Secondary | ICD-10-CM | POA: Diagnosis not present

## 2015-03-07 DIAGNOSIS — Z79899 Other long term (current) drug therapy: Secondary | ICD-10-CM | POA: Diagnosis not present

## 2015-03-07 DIAGNOSIS — N186 End stage renal disease: Secondary | ICD-10-CM | POA: Diagnosis not present

## 2015-03-09 DIAGNOSIS — D509 Iron deficiency anemia, unspecified: Secondary | ICD-10-CM | POA: Diagnosis not present

## 2015-03-09 DIAGNOSIS — D631 Anemia in chronic kidney disease: Secondary | ICD-10-CM | POA: Diagnosis not present

## 2015-03-09 DIAGNOSIS — N186 End stage renal disease: Secondary | ICD-10-CM | POA: Diagnosis not present

## 2015-03-09 DIAGNOSIS — Z79899 Other long term (current) drug therapy: Secondary | ICD-10-CM | POA: Diagnosis not present

## 2015-03-10 DIAGNOSIS — D631 Anemia in chronic kidney disease: Secondary | ICD-10-CM | POA: Diagnosis not present

## 2015-03-10 DIAGNOSIS — N186 End stage renal disease: Secondary | ICD-10-CM | POA: Diagnosis not present

## 2015-03-10 DIAGNOSIS — Z79899 Other long term (current) drug therapy: Secondary | ICD-10-CM | POA: Diagnosis not present

## 2015-03-10 DIAGNOSIS — D509 Iron deficiency anemia, unspecified: Secondary | ICD-10-CM | POA: Diagnosis not present

## 2015-03-12 DIAGNOSIS — D631 Anemia in chronic kidney disease: Secondary | ICD-10-CM | POA: Diagnosis not present

## 2015-03-12 DIAGNOSIS — N186 End stage renal disease: Secondary | ICD-10-CM | POA: Diagnosis not present

## 2015-03-12 DIAGNOSIS — D509 Iron deficiency anemia, unspecified: Secondary | ICD-10-CM | POA: Diagnosis not present

## 2015-03-12 DIAGNOSIS — Z79899 Other long term (current) drug therapy: Secondary | ICD-10-CM | POA: Diagnosis not present

## 2015-03-13 DIAGNOSIS — D631 Anemia in chronic kidney disease: Secondary | ICD-10-CM | POA: Diagnosis not present

## 2015-03-13 DIAGNOSIS — N186 End stage renal disease: Secondary | ICD-10-CM | POA: Diagnosis not present

## 2015-03-13 DIAGNOSIS — Z79899 Other long term (current) drug therapy: Secondary | ICD-10-CM | POA: Diagnosis not present

## 2015-03-13 DIAGNOSIS — D509 Iron deficiency anemia, unspecified: Secondary | ICD-10-CM | POA: Diagnosis not present

## 2015-03-14 DIAGNOSIS — Z79899 Other long term (current) drug therapy: Secondary | ICD-10-CM | POA: Diagnosis not present

## 2015-03-14 DIAGNOSIS — D631 Anemia in chronic kidney disease: Secondary | ICD-10-CM | POA: Diagnosis not present

## 2015-03-14 DIAGNOSIS — N186 End stage renal disease: Secondary | ICD-10-CM | POA: Diagnosis not present

## 2015-03-14 DIAGNOSIS — D509 Iron deficiency anemia, unspecified: Secondary | ICD-10-CM | POA: Diagnosis not present

## 2015-03-16 DIAGNOSIS — D631 Anemia in chronic kidney disease: Secondary | ICD-10-CM | POA: Diagnosis not present

## 2015-03-16 DIAGNOSIS — N186 End stage renal disease: Secondary | ICD-10-CM | POA: Diagnosis not present

## 2015-03-16 DIAGNOSIS — D509 Iron deficiency anemia, unspecified: Secondary | ICD-10-CM | POA: Diagnosis not present

## 2015-03-16 DIAGNOSIS — Z79899 Other long term (current) drug therapy: Secondary | ICD-10-CM | POA: Diagnosis not present

## 2015-03-17 DIAGNOSIS — Z79899 Other long term (current) drug therapy: Secondary | ICD-10-CM | POA: Diagnosis not present

## 2015-03-17 DIAGNOSIS — D509 Iron deficiency anemia, unspecified: Secondary | ICD-10-CM | POA: Diagnosis not present

## 2015-03-17 DIAGNOSIS — D631 Anemia in chronic kidney disease: Secondary | ICD-10-CM | POA: Diagnosis not present

## 2015-03-17 DIAGNOSIS — N186 End stage renal disease: Secondary | ICD-10-CM | POA: Diagnosis not present

## 2015-03-18 ENCOUNTER — Other Ambulatory Visit: Payer: Self-pay | Admitting: Infectious Diseases

## 2015-03-18 DIAGNOSIS — B20 Human immunodeficiency virus [HIV] disease: Secondary | ICD-10-CM

## 2015-03-19 DIAGNOSIS — D631 Anemia in chronic kidney disease: Secondary | ICD-10-CM | POA: Diagnosis not present

## 2015-03-19 DIAGNOSIS — N186 End stage renal disease: Secondary | ICD-10-CM | POA: Diagnosis not present

## 2015-03-19 DIAGNOSIS — Z79899 Other long term (current) drug therapy: Secondary | ICD-10-CM | POA: Diagnosis not present

## 2015-03-19 DIAGNOSIS — D509 Iron deficiency anemia, unspecified: Secondary | ICD-10-CM | POA: Diagnosis not present

## 2015-03-20 DIAGNOSIS — N186 End stage renal disease: Secondary | ICD-10-CM | POA: Diagnosis not present

## 2015-03-20 DIAGNOSIS — D631 Anemia in chronic kidney disease: Secondary | ICD-10-CM | POA: Diagnosis not present

## 2015-03-20 DIAGNOSIS — Z79899 Other long term (current) drug therapy: Secondary | ICD-10-CM | POA: Diagnosis not present

## 2015-03-20 DIAGNOSIS — D509 Iron deficiency anemia, unspecified: Secondary | ICD-10-CM | POA: Diagnosis not present

## 2015-03-21 DIAGNOSIS — I120 Hypertensive chronic kidney disease with stage 5 chronic kidney disease or end stage renal disease: Secondary | ICD-10-CM | POA: Diagnosis not present

## 2015-03-21 DIAGNOSIS — Z992 Dependence on renal dialysis: Secondary | ICD-10-CM | POA: Diagnosis not present

## 2015-03-21 DIAGNOSIS — N186 End stage renal disease: Secondary | ICD-10-CM | POA: Diagnosis not present

## 2015-03-23 DIAGNOSIS — D631 Anemia in chronic kidney disease: Secondary | ICD-10-CM | POA: Diagnosis not present

## 2015-03-23 DIAGNOSIS — D509 Iron deficiency anemia, unspecified: Secondary | ICD-10-CM | POA: Diagnosis not present

## 2015-03-23 DIAGNOSIS — N186 End stage renal disease: Secondary | ICD-10-CM | POA: Diagnosis not present

## 2015-03-23 DIAGNOSIS — N2581 Secondary hyperparathyroidism of renal origin: Secondary | ICD-10-CM | POA: Diagnosis not present

## 2015-03-25 DIAGNOSIS — N186 End stage renal disease: Secondary | ICD-10-CM | POA: Diagnosis not present

## 2015-03-25 DIAGNOSIS — N2581 Secondary hyperparathyroidism of renal origin: Secondary | ICD-10-CM | POA: Diagnosis not present

## 2015-03-25 DIAGNOSIS — D509 Iron deficiency anemia, unspecified: Secondary | ICD-10-CM | POA: Diagnosis not present

## 2015-03-25 DIAGNOSIS — D631 Anemia in chronic kidney disease: Secondary | ICD-10-CM | POA: Diagnosis not present

## 2015-03-27 DIAGNOSIS — D509 Iron deficiency anemia, unspecified: Secondary | ICD-10-CM | POA: Diagnosis not present

## 2015-03-27 DIAGNOSIS — D631 Anemia in chronic kidney disease: Secondary | ICD-10-CM | POA: Diagnosis not present

## 2015-03-27 DIAGNOSIS — N2581 Secondary hyperparathyroidism of renal origin: Secondary | ICD-10-CM | POA: Diagnosis not present

## 2015-03-27 DIAGNOSIS — N186 End stage renal disease: Secondary | ICD-10-CM | POA: Diagnosis not present

## 2015-03-28 DIAGNOSIS — N186 End stage renal disease: Secondary | ICD-10-CM | POA: Diagnosis not present

## 2015-03-28 DIAGNOSIS — D509 Iron deficiency anemia, unspecified: Secondary | ICD-10-CM | POA: Diagnosis not present

## 2015-03-28 DIAGNOSIS — D631 Anemia in chronic kidney disease: Secondary | ICD-10-CM | POA: Diagnosis not present

## 2015-03-28 DIAGNOSIS — N2581 Secondary hyperparathyroidism of renal origin: Secondary | ICD-10-CM | POA: Diagnosis not present

## 2015-03-30 DIAGNOSIS — D509 Iron deficiency anemia, unspecified: Secondary | ICD-10-CM | POA: Diagnosis not present

## 2015-03-30 DIAGNOSIS — N186 End stage renal disease: Secondary | ICD-10-CM | POA: Diagnosis not present

## 2015-03-30 DIAGNOSIS — D631 Anemia in chronic kidney disease: Secondary | ICD-10-CM | POA: Diagnosis not present

## 2015-03-30 DIAGNOSIS — N2581 Secondary hyperparathyroidism of renal origin: Secondary | ICD-10-CM | POA: Diagnosis not present

## 2015-04-01 DIAGNOSIS — D631 Anemia in chronic kidney disease: Secondary | ICD-10-CM | POA: Diagnosis not present

## 2015-04-01 DIAGNOSIS — N2581 Secondary hyperparathyroidism of renal origin: Secondary | ICD-10-CM | POA: Diagnosis not present

## 2015-04-01 DIAGNOSIS — D509 Iron deficiency anemia, unspecified: Secondary | ICD-10-CM | POA: Diagnosis not present

## 2015-04-01 DIAGNOSIS — N186 End stage renal disease: Secondary | ICD-10-CM | POA: Diagnosis not present

## 2015-04-03 DIAGNOSIS — N2581 Secondary hyperparathyroidism of renal origin: Secondary | ICD-10-CM | POA: Diagnosis not present

## 2015-04-03 DIAGNOSIS — D631 Anemia in chronic kidney disease: Secondary | ICD-10-CM | POA: Diagnosis not present

## 2015-04-03 DIAGNOSIS — D509 Iron deficiency anemia, unspecified: Secondary | ICD-10-CM | POA: Diagnosis not present

## 2015-04-03 DIAGNOSIS — N186 End stage renal disease: Secondary | ICD-10-CM | POA: Diagnosis not present

## 2015-04-04 DIAGNOSIS — D509 Iron deficiency anemia, unspecified: Secondary | ICD-10-CM | POA: Diagnosis not present

## 2015-04-04 DIAGNOSIS — D631 Anemia in chronic kidney disease: Secondary | ICD-10-CM | POA: Diagnosis not present

## 2015-04-04 DIAGNOSIS — N2581 Secondary hyperparathyroidism of renal origin: Secondary | ICD-10-CM | POA: Diagnosis not present

## 2015-04-04 DIAGNOSIS — N186 End stage renal disease: Secondary | ICD-10-CM | POA: Diagnosis not present

## 2015-04-05 DIAGNOSIS — D509 Iron deficiency anemia, unspecified: Secondary | ICD-10-CM | POA: Diagnosis not present

## 2015-04-05 DIAGNOSIS — M109 Gout, unspecified: Secondary | ICD-10-CM | POA: Diagnosis not present

## 2015-04-05 DIAGNOSIS — N186 End stage renal disease: Secondary | ICD-10-CM | POA: Diagnosis not present

## 2015-04-05 DIAGNOSIS — E785 Hyperlipidemia, unspecified: Secondary | ICD-10-CM | POA: Diagnosis not present

## 2015-04-05 DIAGNOSIS — D631 Anemia in chronic kidney disease: Secondary | ICD-10-CM | POA: Diagnosis not present

## 2015-04-05 DIAGNOSIS — N2581 Secondary hyperparathyroidism of renal origin: Secondary | ICD-10-CM | POA: Diagnosis not present

## 2015-04-06 DIAGNOSIS — N2581 Secondary hyperparathyroidism of renal origin: Secondary | ICD-10-CM | POA: Diagnosis not present

## 2015-04-06 DIAGNOSIS — N186 End stage renal disease: Secondary | ICD-10-CM | POA: Diagnosis not present

## 2015-04-06 DIAGNOSIS — D631 Anemia in chronic kidney disease: Secondary | ICD-10-CM | POA: Diagnosis not present

## 2015-04-06 DIAGNOSIS — D509 Iron deficiency anemia, unspecified: Secondary | ICD-10-CM | POA: Diagnosis not present

## 2015-04-09 DIAGNOSIS — D509 Iron deficiency anemia, unspecified: Secondary | ICD-10-CM | POA: Diagnosis not present

## 2015-04-09 DIAGNOSIS — N186 End stage renal disease: Secondary | ICD-10-CM | POA: Diagnosis not present

## 2015-04-09 DIAGNOSIS — N2581 Secondary hyperparathyroidism of renal origin: Secondary | ICD-10-CM | POA: Diagnosis not present

## 2015-04-09 DIAGNOSIS — D631 Anemia in chronic kidney disease: Secondary | ICD-10-CM | POA: Diagnosis not present

## 2015-04-11 DIAGNOSIS — D631 Anemia in chronic kidney disease: Secondary | ICD-10-CM | POA: Diagnosis not present

## 2015-04-11 DIAGNOSIS — N2581 Secondary hyperparathyroidism of renal origin: Secondary | ICD-10-CM | POA: Diagnosis not present

## 2015-04-11 DIAGNOSIS — N186 End stage renal disease: Secondary | ICD-10-CM | POA: Diagnosis not present

## 2015-04-11 DIAGNOSIS — D509 Iron deficiency anemia, unspecified: Secondary | ICD-10-CM | POA: Diagnosis not present

## 2015-04-15 DIAGNOSIS — D509 Iron deficiency anemia, unspecified: Secondary | ICD-10-CM | POA: Diagnosis not present

## 2015-04-15 DIAGNOSIS — D631 Anemia in chronic kidney disease: Secondary | ICD-10-CM | POA: Diagnosis not present

## 2015-04-15 DIAGNOSIS — N2581 Secondary hyperparathyroidism of renal origin: Secondary | ICD-10-CM | POA: Diagnosis not present

## 2015-04-15 DIAGNOSIS — N186 End stage renal disease: Secondary | ICD-10-CM | POA: Diagnosis not present

## 2015-04-17 DIAGNOSIS — D631 Anemia in chronic kidney disease: Secondary | ICD-10-CM | POA: Diagnosis not present

## 2015-04-17 DIAGNOSIS — N186 End stage renal disease: Secondary | ICD-10-CM | POA: Diagnosis not present

## 2015-04-17 DIAGNOSIS — N2581 Secondary hyperparathyroidism of renal origin: Secondary | ICD-10-CM | POA: Diagnosis not present

## 2015-04-17 DIAGNOSIS — D509 Iron deficiency anemia, unspecified: Secondary | ICD-10-CM | POA: Diagnosis not present

## 2015-04-19 DIAGNOSIS — D631 Anemia in chronic kidney disease: Secondary | ICD-10-CM | POA: Diagnosis not present

## 2015-04-19 DIAGNOSIS — D509 Iron deficiency anemia, unspecified: Secondary | ICD-10-CM | POA: Diagnosis not present

## 2015-04-19 DIAGNOSIS — N2581 Secondary hyperparathyroidism of renal origin: Secondary | ICD-10-CM | POA: Diagnosis not present

## 2015-04-19 DIAGNOSIS — N186 End stage renal disease: Secondary | ICD-10-CM | POA: Diagnosis not present

## 2015-04-20 DIAGNOSIS — N2581 Secondary hyperparathyroidism of renal origin: Secondary | ICD-10-CM | POA: Diagnosis not present

## 2015-04-20 DIAGNOSIS — D509 Iron deficiency anemia, unspecified: Secondary | ICD-10-CM | POA: Diagnosis not present

## 2015-04-20 DIAGNOSIS — D631 Anemia in chronic kidney disease: Secondary | ICD-10-CM | POA: Diagnosis not present

## 2015-04-20 DIAGNOSIS — N186 End stage renal disease: Secondary | ICD-10-CM | POA: Diagnosis not present

## 2015-04-21 DIAGNOSIS — N186 End stage renal disease: Secondary | ICD-10-CM | POA: Diagnosis not present

## 2015-04-21 DIAGNOSIS — I120 Hypertensive chronic kidney disease with stage 5 chronic kidney disease or end stage renal disease: Secondary | ICD-10-CM | POA: Diagnosis not present

## 2015-04-21 DIAGNOSIS — Z992 Dependence on renal dialysis: Secondary | ICD-10-CM | POA: Diagnosis not present

## 2015-04-22 DIAGNOSIS — Z79899 Other long term (current) drug therapy: Secondary | ICD-10-CM | POA: Diagnosis not present

## 2015-04-22 DIAGNOSIS — N186 End stage renal disease: Secondary | ICD-10-CM | POA: Diagnosis not present

## 2015-04-22 DIAGNOSIS — N2581 Secondary hyperparathyroidism of renal origin: Secondary | ICD-10-CM | POA: Diagnosis not present

## 2015-04-22 DIAGNOSIS — D509 Iron deficiency anemia, unspecified: Secondary | ICD-10-CM | POA: Diagnosis not present

## 2015-04-22 DIAGNOSIS — D631 Anemia in chronic kidney disease: Secondary | ICD-10-CM | POA: Diagnosis not present

## 2015-04-24 DIAGNOSIS — N186 End stage renal disease: Secondary | ICD-10-CM | POA: Diagnosis not present

## 2015-04-24 DIAGNOSIS — D631 Anemia in chronic kidney disease: Secondary | ICD-10-CM | POA: Diagnosis not present

## 2015-04-24 DIAGNOSIS — N2581 Secondary hyperparathyroidism of renal origin: Secondary | ICD-10-CM | POA: Diagnosis not present

## 2015-04-24 DIAGNOSIS — D509 Iron deficiency anemia, unspecified: Secondary | ICD-10-CM | POA: Diagnosis not present

## 2015-04-24 DIAGNOSIS — Z79899 Other long term (current) drug therapy: Secondary | ICD-10-CM | POA: Diagnosis not present

## 2015-04-26 DIAGNOSIS — D509 Iron deficiency anemia, unspecified: Secondary | ICD-10-CM | POA: Diagnosis not present

## 2015-04-26 DIAGNOSIS — Z79899 Other long term (current) drug therapy: Secondary | ICD-10-CM | POA: Diagnosis not present

## 2015-04-26 DIAGNOSIS — N186 End stage renal disease: Secondary | ICD-10-CM | POA: Diagnosis not present

## 2015-04-26 DIAGNOSIS — D631 Anemia in chronic kidney disease: Secondary | ICD-10-CM | POA: Diagnosis not present

## 2015-04-26 DIAGNOSIS — N2581 Secondary hyperparathyroidism of renal origin: Secondary | ICD-10-CM | POA: Diagnosis not present

## 2015-04-27 DIAGNOSIS — N2581 Secondary hyperparathyroidism of renal origin: Secondary | ICD-10-CM | POA: Diagnosis not present

## 2015-04-27 DIAGNOSIS — D509 Iron deficiency anemia, unspecified: Secondary | ICD-10-CM | POA: Diagnosis not present

## 2015-04-27 DIAGNOSIS — N186 End stage renal disease: Secondary | ICD-10-CM | POA: Diagnosis not present

## 2015-04-27 DIAGNOSIS — D631 Anemia in chronic kidney disease: Secondary | ICD-10-CM | POA: Diagnosis not present

## 2015-04-27 DIAGNOSIS — Z79899 Other long term (current) drug therapy: Secondary | ICD-10-CM | POA: Diagnosis not present

## 2015-04-28 DIAGNOSIS — D509 Iron deficiency anemia, unspecified: Secondary | ICD-10-CM | POA: Diagnosis not present

## 2015-04-28 DIAGNOSIS — D631 Anemia in chronic kidney disease: Secondary | ICD-10-CM | POA: Diagnosis not present

## 2015-04-28 DIAGNOSIS — Z79899 Other long term (current) drug therapy: Secondary | ICD-10-CM | POA: Diagnosis not present

## 2015-04-28 DIAGNOSIS — N2581 Secondary hyperparathyroidism of renal origin: Secondary | ICD-10-CM | POA: Diagnosis not present

## 2015-04-28 DIAGNOSIS — N186 End stage renal disease: Secondary | ICD-10-CM | POA: Diagnosis not present

## 2015-04-29 DIAGNOSIS — N186 End stage renal disease: Secondary | ICD-10-CM | POA: Diagnosis not present

## 2015-04-29 DIAGNOSIS — N2581 Secondary hyperparathyroidism of renal origin: Secondary | ICD-10-CM | POA: Diagnosis not present

## 2015-04-29 DIAGNOSIS — M109 Gout, unspecified: Secondary | ICD-10-CM | POA: Diagnosis not present

## 2015-04-29 DIAGNOSIS — D631 Anemia in chronic kidney disease: Secondary | ICD-10-CM | POA: Diagnosis not present

## 2015-04-29 DIAGNOSIS — Z79899 Other long term (current) drug therapy: Secondary | ICD-10-CM | POA: Diagnosis not present

## 2015-04-29 DIAGNOSIS — D509 Iron deficiency anemia, unspecified: Secondary | ICD-10-CM | POA: Diagnosis not present

## 2015-04-30 ENCOUNTER — Other Ambulatory Visit: Payer: Medicare Other

## 2015-04-30 DIAGNOSIS — B181 Chronic viral hepatitis B without delta-agent: Secondary | ICD-10-CM

## 2015-04-30 DIAGNOSIS — B20 Human immunodeficiency virus [HIV] disease: Secondary | ICD-10-CM

## 2015-04-30 DIAGNOSIS — B1911 Unspecified viral hepatitis B with hepatic coma: Secondary | ICD-10-CM | POA: Diagnosis not present

## 2015-04-30 LAB — CBC
HCT: 41.8 % (ref 39.0–52.0)
HEMOGLOBIN: 14.3 g/dL (ref 13.0–17.0)
MCH: 31.5 pg (ref 26.0–34.0)
MCHC: 34.2 g/dL (ref 30.0–36.0)
MCV: 92.1 fL (ref 78.0–100.0)
MPV: 8.5 fL — ABNORMAL LOW (ref 8.6–12.4)
PLATELETS: 205 10*3/uL (ref 150–400)
RBC: 4.54 MIL/uL (ref 4.22–5.81)
RDW: 16.6 % — ABNORMAL HIGH (ref 11.5–15.5)
WBC: 7.8 10*3/uL (ref 4.0–10.5)

## 2015-05-01 ENCOUNTER — Telehealth: Payer: Self-pay | Admitting: *Deleted

## 2015-05-01 LAB — COMPREHENSIVE METABOLIC PANEL
ALK PHOS: 82 U/L (ref 40–115)
ALT: 8 U/L — ABNORMAL LOW (ref 9–46)
AST: 9 U/L — ABNORMAL LOW (ref 10–35)
Albumin: 4.5 g/dL (ref 3.6–5.1)
BUN: 62 mg/dL — AB (ref 7–25)
CO2: 24 mmol/L (ref 20–31)
CREATININE: 14 mg/dL — AB (ref 0.70–1.25)
Calcium: 10 mg/dL (ref 8.6–10.3)
Chloride: 98 mmol/L (ref 98–110)
Glucose, Bld: 98 mg/dL (ref 65–99)
Potassium: 5 mmol/L (ref 3.5–5.3)
Sodium: 138 mmol/L (ref 135–146)
TOTAL PROTEIN: 8.4 g/dL — AB (ref 6.1–8.1)
Total Bilirubin: 0.4 mg/dL (ref 0.2–1.2)

## 2015-05-01 LAB — HEPATITIS A ANTIBODY, TOTAL: Hep A Total Ab: NONREACTIVE

## 2015-05-01 LAB — T-HELPER CELL (CD4) - (RCID CLINIC ONLY)
CD4 % Helper T Cell: 27 % — ABNORMAL LOW (ref 33–55)
CD4 T Cell Abs: 490 /uL (ref 400–2700)

## 2015-05-01 NOTE — Telephone Encounter (Signed)
Call from [place] at [place].  Patient's creatinine 14.00. Pt on home dialysis, will 'cc his nephrologist. Landis Gandy, RN

## 2015-05-02 LAB — HIV-1 RNA QUANT-NO REFLEX-BLD: HIV-1 RNA Quant, Log: <1.3 {Log} (ref <1.30)

## 2015-05-03 LAB — HEPATITIS B DNA, ULTRAQUANTITATIVE, PCR: Hepatitis B DNA: NOT DETECTED IU/mL (ref <20)

## 2015-05-06 DIAGNOSIS — D509 Iron deficiency anemia, unspecified: Secondary | ICD-10-CM | POA: Diagnosis not present

## 2015-05-06 DIAGNOSIS — N186 End stage renal disease: Secondary | ICD-10-CM | POA: Diagnosis not present

## 2015-05-06 DIAGNOSIS — Z79899 Other long term (current) drug therapy: Secondary | ICD-10-CM | POA: Diagnosis not present

## 2015-05-06 DIAGNOSIS — N2581 Secondary hyperparathyroidism of renal origin: Secondary | ICD-10-CM | POA: Diagnosis not present

## 2015-05-06 DIAGNOSIS — D631 Anemia in chronic kidney disease: Secondary | ICD-10-CM | POA: Diagnosis not present

## 2015-05-08 DIAGNOSIS — N2581 Secondary hyperparathyroidism of renal origin: Secondary | ICD-10-CM | POA: Diagnosis not present

## 2015-05-08 DIAGNOSIS — D631 Anemia in chronic kidney disease: Secondary | ICD-10-CM | POA: Diagnosis not present

## 2015-05-08 DIAGNOSIS — D509 Iron deficiency anemia, unspecified: Secondary | ICD-10-CM | POA: Diagnosis not present

## 2015-05-08 DIAGNOSIS — N186 End stage renal disease: Secondary | ICD-10-CM | POA: Diagnosis not present

## 2015-05-08 DIAGNOSIS — Z79899 Other long term (current) drug therapy: Secondary | ICD-10-CM | POA: Diagnosis not present

## 2015-05-10 DIAGNOSIS — N2581 Secondary hyperparathyroidism of renal origin: Secondary | ICD-10-CM | POA: Diagnosis not present

## 2015-05-10 DIAGNOSIS — Z79899 Other long term (current) drug therapy: Secondary | ICD-10-CM | POA: Diagnosis not present

## 2015-05-10 DIAGNOSIS — D631 Anemia in chronic kidney disease: Secondary | ICD-10-CM | POA: Diagnosis not present

## 2015-05-10 DIAGNOSIS — N186 End stage renal disease: Secondary | ICD-10-CM | POA: Diagnosis not present

## 2015-05-10 DIAGNOSIS — D509 Iron deficiency anemia, unspecified: Secondary | ICD-10-CM | POA: Diagnosis not present

## 2015-05-13 DIAGNOSIS — Z79899 Other long term (current) drug therapy: Secondary | ICD-10-CM | POA: Diagnosis not present

## 2015-05-13 DIAGNOSIS — N186 End stage renal disease: Secondary | ICD-10-CM | POA: Diagnosis not present

## 2015-05-13 DIAGNOSIS — D631 Anemia in chronic kidney disease: Secondary | ICD-10-CM | POA: Diagnosis not present

## 2015-05-13 DIAGNOSIS — N2581 Secondary hyperparathyroidism of renal origin: Secondary | ICD-10-CM | POA: Diagnosis not present

## 2015-05-13 DIAGNOSIS — D509 Iron deficiency anemia, unspecified: Secondary | ICD-10-CM | POA: Diagnosis not present

## 2015-05-15 ENCOUNTER — Ambulatory Visit: Payer: Medicare Other | Admitting: Infectious Diseases

## 2015-05-15 DIAGNOSIS — D631 Anemia in chronic kidney disease: Secondary | ICD-10-CM | POA: Diagnosis not present

## 2015-05-15 DIAGNOSIS — Z79899 Other long term (current) drug therapy: Secondary | ICD-10-CM | POA: Diagnosis not present

## 2015-05-15 DIAGNOSIS — D509 Iron deficiency anemia, unspecified: Secondary | ICD-10-CM | POA: Diagnosis not present

## 2015-05-15 DIAGNOSIS — N186 End stage renal disease: Secondary | ICD-10-CM | POA: Diagnosis not present

## 2015-05-15 DIAGNOSIS — N2581 Secondary hyperparathyroidism of renal origin: Secondary | ICD-10-CM | POA: Diagnosis not present

## 2015-05-17 DIAGNOSIS — N2581 Secondary hyperparathyroidism of renal origin: Secondary | ICD-10-CM | POA: Diagnosis not present

## 2015-05-17 DIAGNOSIS — D509 Iron deficiency anemia, unspecified: Secondary | ICD-10-CM | POA: Diagnosis not present

## 2015-05-17 DIAGNOSIS — N186 End stage renal disease: Secondary | ICD-10-CM | POA: Diagnosis not present

## 2015-05-17 DIAGNOSIS — Z79899 Other long term (current) drug therapy: Secondary | ICD-10-CM | POA: Diagnosis not present

## 2015-05-17 DIAGNOSIS — D631 Anemia in chronic kidney disease: Secondary | ICD-10-CM | POA: Diagnosis not present

## 2015-05-18 DIAGNOSIS — D631 Anemia in chronic kidney disease: Secondary | ICD-10-CM | POA: Diagnosis not present

## 2015-05-18 DIAGNOSIS — Z79899 Other long term (current) drug therapy: Secondary | ICD-10-CM | POA: Diagnosis not present

## 2015-05-18 DIAGNOSIS — D509 Iron deficiency anemia, unspecified: Secondary | ICD-10-CM | POA: Diagnosis not present

## 2015-05-18 DIAGNOSIS — N2581 Secondary hyperparathyroidism of renal origin: Secondary | ICD-10-CM | POA: Diagnosis not present

## 2015-05-18 DIAGNOSIS — N186 End stage renal disease: Secondary | ICD-10-CM | POA: Diagnosis not present

## 2015-05-20 DIAGNOSIS — N2581 Secondary hyperparathyroidism of renal origin: Secondary | ICD-10-CM | POA: Diagnosis not present

## 2015-05-20 DIAGNOSIS — D509 Iron deficiency anemia, unspecified: Secondary | ICD-10-CM | POA: Diagnosis not present

## 2015-05-20 DIAGNOSIS — N186 End stage renal disease: Secondary | ICD-10-CM | POA: Diagnosis not present

## 2015-05-20 DIAGNOSIS — Z79899 Other long term (current) drug therapy: Secondary | ICD-10-CM | POA: Diagnosis not present

## 2015-05-20 DIAGNOSIS — D631 Anemia in chronic kidney disease: Secondary | ICD-10-CM | POA: Diagnosis not present

## 2015-05-22 DIAGNOSIS — D509 Iron deficiency anemia, unspecified: Secondary | ICD-10-CM | POA: Diagnosis not present

## 2015-05-22 DIAGNOSIS — I120 Hypertensive chronic kidney disease with stage 5 chronic kidney disease or end stage renal disease: Secondary | ICD-10-CM | POA: Diagnosis not present

## 2015-05-22 DIAGNOSIS — N2581 Secondary hyperparathyroidism of renal origin: Secondary | ICD-10-CM | POA: Diagnosis not present

## 2015-05-22 DIAGNOSIS — Z79899 Other long term (current) drug therapy: Secondary | ICD-10-CM | POA: Diagnosis not present

## 2015-05-22 DIAGNOSIS — N186 End stage renal disease: Secondary | ICD-10-CM | POA: Diagnosis not present

## 2015-05-22 DIAGNOSIS — D631 Anemia in chronic kidney disease: Secondary | ICD-10-CM | POA: Diagnosis not present

## 2015-05-22 DIAGNOSIS — Z992 Dependence on renal dialysis: Secondary | ICD-10-CM | POA: Diagnosis not present

## 2015-05-24 DIAGNOSIS — D631 Anemia in chronic kidney disease: Secondary | ICD-10-CM | POA: Diagnosis not present

## 2015-05-24 DIAGNOSIS — Z79899 Other long term (current) drug therapy: Secondary | ICD-10-CM | POA: Diagnosis not present

## 2015-05-24 DIAGNOSIS — N186 End stage renal disease: Secondary | ICD-10-CM | POA: Diagnosis not present

## 2015-05-24 DIAGNOSIS — N2581 Secondary hyperparathyroidism of renal origin: Secondary | ICD-10-CM | POA: Diagnosis not present

## 2015-05-24 DIAGNOSIS — D509 Iron deficiency anemia, unspecified: Secondary | ICD-10-CM | POA: Diagnosis not present

## 2015-05-27 DIAGNOSIS — N2581 Secondary hyperparathyroidism of renal origin: Secondary | ICD-10-CM | POA: Diagnosis not present

## 2015-05-27 DIAGNOSIS — D509 Iron deficiency anemia, unspecified: Secondary | ICD-10-CM | POA: Diagnosis not present

## 2015-05-27 DIAGNOSIS — D631 Anemia in chronic kidney disease: Secondary | ICD-10-CM | POA: Diagnosis not present

## 2015-05-27 DIAGNOSIS — N186 End stage renal disease: Secondary | ICD-10-CM | POA: Diagnosis not present

## 2015-05-27 DIAGNOSIS — M109 Gout, unspecified: Secondary | ICD-10-CM | POA: Diagnosis not present

## 2015-05-27 DIAGNOSIS — Z79899 Other long term (current) drug therapy: Secondary | ICD-10-CM | POA: Diagnosis not present

## 2015-05-28 DIAGNOSIS — Z79899 Other long term (current) drug therapy: Secondary | ICD-10-CM | POA: Diagnosis not present

## 2015-05-28 DIAGNOSIS — D631 Anemia in chronic kidney disease: Secondary | ICD-10-CM | POA: Diagnosis not present

## 2015-05-28 DIAGNOSIS — N2581 Secondary hyperparathyroidism of renal origin: Secondary | ICD-10-CM | POA: Diagnosis not present

## 2015-05-28 DIAGNOSIS — D509 Iron deficiency anemia, unspecified: Secondary | ICD-10-CM | POA: Diagnosis not present

## 2015-05-28 DIAGNOSIS — N186 End stage renal disease: Secondary | ICD-10-CM | POA: Diagnosis not present

## 2015-05-31 DIAGNOSIS — N186 End stage renal disease: Secondary | ICD-10-CM | POA: Diagnosis not present

## 2015-05-31 DIAGNOSIS — N2581 Secondary hyperparathyroidism of renal origin: Secondary | ICD-10-CM | POA: Diagnosis not present

## 2015-05-31 DIAGNOSIS — Z79899 Other long term (current) drug therapy: Secondary | ICD-10-CM | POA: Diagnosis not present

## 2015-05-31 DIAGNOSIS — D631 Anemia in chronic kidney disease: Secondary | ICD-10-CM | POA: Diagnosis not present

## 2015-05-31 DIAGNOSIS — D509 Iron deficiency anemia, unspecified: Secondary | ICD-10-CM | POA: Diagnosis not present

## 2015-06-02 DIAGNOSIS — N2581 Secondary hyperparathyroidism of renal origin: Secondary | ICD-10-CM | POA: Diagnosis not present

## 2015-06-02 DIAGNOSIS — N186 End stage renal disease: Secondary | ICD-10-CM | POA: Diagnosis not present

## 2015-06-02 DIAGNOSIS — Z79899 Other long term (current) drug therapy: Secondary | ICD-10-CM | POA: Diagnosis not present

## 2015-06-02 DIAGNOSIS — D509 Iron deficiency anemia, unspecified: Secondary | ICD-10-CM | POA: Diagnosis not present

## 2015-06-02 DIAGNOSIS — D631 Anemia in chronic kidney disease: Secondary | ICD-10-CM | POA: Diagnosis not present

## 2015-06-04 DIAGNOSIS — N2581 Secondary hyperparathyroidism of renal origin: Secondary | ICD-10-CM | POA: Diagnosis not present

## 2015-06-04 DIAGNOSIS — N186 End stage renal disease: Secondary | ICD-10-CM | POA: Diagnosis not present

## 2015-06-04 DIAGNOSIS — D631 Anemia in chronic kidney disease: Secondary | ICD-10-CM | POA: Diagnosis not present

## 2015-06-04 DIAGNOSIS — Z79899 Other long term (current) drug therapy: Secondary | ICD-10-CM | POA: Diagnosis not present

## 2015-06-04 DIAGNOSIS — D509 Iron deficiency anemia, unspecified: Secondary | ICD-10-CM | POA: Diagnosis not present

## 2015-06-06 DIAGNOSIS — N186 End stage renal disease: Secondary | ICD-10-CM | POA: Diagnosis not present

## 2015-06-06 DIAGNOSIS — D631 Anemia in chronic kidney disease: Secondary | ICD-10-CM | POA: Diagnosis not present

## 2015-06-06 DIAGNOSIS — D509 Iron deficiency anemia, unspecified: Secondary | ICD-10-CM | POA: Diagnosis not present

## 2015-06-06 DIAGNOSIS — N2581 Secondary hyperparathyroidism of renal origin: Secondary | ICD-10-CM | POA: Diagnosis not present

## 2015-06-06 DIAGNOSIS — Z79899 Other long term (current) drug therapy: Secondary | ICD-10-CM | POA: Diagnosis not present

## 2015-06-07 ENCOUNTER — Encounter (HOSPITAL_COMMUNITY): Payer: Self-pay | Admitting: Emergency Medicine

## 2015-06-07 ENCOUNTER — Emergency Department (HOSPITAL_COMMUNITY)
Admission: EM | Admit: 2015-06-07 | Discharge: 2015-06-07 | Disposition: A | Discharge status: Home / Self Care | Payer: Medicare Other | Attending: Emergency Medicine | Admitting: Emergency Medicine

## 2015-06-07 DIAGNOSIS — Z862 Personal history of diseases of the blood and blood-forming organs and certain disorders involving the immune mechanism: Secondary | ICD-10-CM | POA: Insufficient documentation

## 2015-06-07 DIAGNOSIS — Z8619 Personal history of other infectious and parasitic diseases: Secondary | ICD-10-CM | POA: Insufficient documentation

## 2015-06-07 DIAGNOSIS — Z79899 Other long term (current) drug therapy: Secondary | ICD-10-CM | POA: Insufficient documentation

## 2015-06-07 DIAGNOSIS — I10 Essential (primary) hypertension: Secondary | ICD-10-CM

## 2015-06-07 DIAGNOSIS — J45909 Unspecified asthma, uncomplicated: Secondary | ICD-10-CM | POA: Diagnosis not present

## 2015-06-07 DIAGNOSIS — N186 End stage renal disease: Secondary | ICD-10-CM | POA: Diagnosis not present

## 2015-06-07 DIAGNOSIS — Z720 Tobacco use: Secondary | ICD-10-CM | POA: Diagnosis not present

## 2015-06-07 DIAGNOSIS — D509 Iron deficiency anemia, unspecified: Secondary | ICD-10-CM | POA: Diagnosis not present

## 2015-06-07 DIAGNOSIS — R030 Elevated blood-pressure reading, without diagnosis of hypertension: Secondary | ICD-10-CM | POA: Diagnosis not present

## 2015-06-07 DIAGNOSIS — Z21 Asymptomatic human immunodeficiency virus [HIV] infection status: Secondary | ICD-10-CM | POA: Insufficient documentation

## 2015-06-07 DIAGNOSIS — R51 Headache: Secondary | ICD-10-CM | POA: Diagnosis present

## 2015-06-07 DIAGNOSIS — F419 Anxiety disorder, unspecified: Secondary | ICD-10-CM | POA: Insufficient documentation

## 2015-06-07 DIAGNOSIS — F17210 Nicotine dependence, cigarettes, uncomplicated: Secondary | ICD-10-CM | POA: Diagnosis not present

## 2015-06-07 DIAGNOSIS — I120 Hypertensive chronic kidney disease with stage 5 chronic kidney disease or end stage renal disease: Secondary | ICD-10-CM | POA: Diagnosis not present

## 2015-06-07 DIAGNOSIS — N2581 Secondary hyperparathyroidism of renal origin: Secondary | ICD-10-CM | POA: Diagnosis not present

## 2015-06-07 DIAGNOSIS — D631 Anemia in chronic kidney disease: Secondary | ICD-10-CM | POA: Diagnosis not present

## 2015-06-07 DIAGNOSIS — Z880 Allergy status to penicillin: Secondary | ICD-10-CM | POA: Insufficient documentation

## 2015-06-07 DIAGNOSIS — G4489 Other headache syndrome: Secondary | ICD-10-CM | POA: Diagnosis not present

## 2015-06-07 LAB — BASIC METABOLIC PANEL
ANION GAP: 11 (ref 5–15)
BUN: 28 mg/dL — ABNORMAL HIGH (ref 6–20)
CO2: 26 mmol/L (ref 22–32)
Calcium: 9.9 mg/dL (ref 8.9–10.3)
Chloride: 96 mmol/L — ABNORMAL LOW (ref 101–111)
Creatinine, Ser: 8.67 mg/dL — ABNORMAL HIGH (ref 0.61–1.24)
GFR, EST AFRICAN AMERICAN: 7 mL/min — AB (ref >60)
GFR, EST NON AFRICAN AMERICAN: 6 mL/min — AB (ref >60)
Glucose, Bld: 96 mg/dL (ref 65–99)
Potassium: 3.1 mmol/L — ABNORMAL LOW (ref 3.5–5.1)
Sodium: 133 mmol/L — ABNORMAL LOW (ref 135–145)

## 2015-06-07 LAB — CBC WITH DIFFERENTIAL/PLATELET
Basophils Absolute: 0 10*3/uL (ref 0.0–0.1)
Basophils Relative: 0 %
Eosinophils Absolute: 0.5 10*3/uL (ref 0.0–0.7)
Eosinophils Relative: 8 %
HEMATOCRIT: 31.5 % — AB (ref 39.0–52.0)
HEMOGLOBIN: 11 g/dL — AB (ref 13.0–17.0)
Lymphocytes Relative: 21 %
Lymphs Abs: 1.4 10*3/uL (ref 0.7–4.0)
MCH: 31.5 pg (ref 26.0–34.0)
MCHC: 34.9 g/dL (ref 30.0–36.0)
MCV: 90.3 fL (ref 78.0–100.0)
MONOS PCT: 9 %
Monocytes Absolute: 0.6 10*3/uL (ref 0.1–1.0)
NEUTROS ABS: 4.1 10*3/uL (ref 1.7–7.7)
NEUTROS PCT: 62 %
Platelets: 147 10*3/uL — ABNORMAL LOW (ref 150–400)
RBC: 3.49 MIL/uL — ABNORMAL LOW (ref 4.22–5.81)
RDW: 14.2 % (ref 11.5–15.5)
WBC: 6.6 10*3/uL (ref 4.0–10.5)

## 2015-06-07 MED ORDER — HYDRALAZINE HCL 20 MG/ML IJ SOLN
20.0000 mg | Freq: Once | INTRAMUSCULAR | Status: AC
  Administered 2015-06-07: 20 mg via INTRAVENOUS
  Filled 2015-06-07: qty 1

## 2015-06-07 MED ORDER — LABETALOL HCL 100 MG PO TABS
100.0000 mg | ORAL_TABLET | Freq: Once | ORAL | Status: AC
  Administered 2015-06-07: 100 mg via ORAL
  Filled 2015-06-07: qty 1

## 2015-06-07 MED ORDER — HYDRALAZINE HCL 10 MG PO TABS
10.0000 mg | ORAL_TABLET | Freq: Once | ORAL | Status: AC
  Administered 2015-06-07: 10 mg via ORAL
  Filled 2015-06-07: qty 1

## 2015-06-07 MED ORDER — HYDRALAZINE HCL 10 MG PO TABS: 10.0000 mg | ORAL_TABLET | Freq: Four times a day (QID) | ORAL | Status: DC

## 2015-06-07 NOTE — ED Notes (Signed)
Per EMS: home dialysis, headache today during treatment and hypertensive, called his dialysis center and she recommended calling 911.  Consistently hypertensive with EMS, BP was 194/120. Received full treatment of dialysis.  No neuro deficits noted during triage.  No CP or SOB.

## 2015-06-07 NOTE — Discharge Instructions (Signed)
Hypertension °Hypertension, commonly called high blood pressure, is when the force of blood pumping through your arteries is too strong. Your arteries are the blood vessels that carry blood from your heart throughout your body. A blood pressure reading consists of a higher number over a lower number, such as 110/72. The higher number (systolic) is the pressure inside your arteries when your heart pumps. The lower number (diastolic) is the pressure inside your arteries when your heart relaxes. Ideally you want your blood pressure below 120/80. °Hypertension forces your heart to work harder to pump blood. Your arteries may become narrow or stiff. Having hypertension puts you at risk for heart disease, stroke, and other problems.  °RISK FACTORS °Some risk factors for high blood pressure are controllable. Others are not.  °Risk factors you cannot control include:  °· Race. You may be at higher risk if you are African American. °· Age. Risk increases with age. °· Gender. Men are at higher risk than women before age 45 years. After age 65, women are at higher risk than men. °Risk factors you can control include: °· Not getting enough exercise or physical activity. °· Being overweight. °· Getting too much fat, sugar, calories, or salt in your diet. °· Drinking too much alcohol. °SIGNS AND SYMPTOMS °Hypertension does not usually cause signs or symptoms. Extremely high blood pressure (hypertensive crisis) may cause headache, anxiety, shortness of breath, and nosebleed. °DIAGNOSIS  °To check if you have hypertension, your health care provider will measure your blood pressure while you are seated, with your arm held at the level of your heart. It should be measured at least twice using the same arm. Certain conditions can cause a difference in blood pressure between your right and left arms. A blood pressure reading that is higher than normal on one occasion does not mean that you need treatment. If one blood pressure reading  is high, ask your health care provider about having it checked again. °TREATMENT  °Treating high blood pressure includes making lifestyle changes and possibly taking medicine. Living a healthy lifestyle can help lower high blood pressure. You may need to change some of your habits. °Lifestyle changes may include: °· Following the DASH diet. This diet is high in fruits, vegetables, and whole grains. It is low in salt, red meat, and added sugars. °· Getting at least 2½ hours of brisk physical activity every week. °· Losing weight if necessary. °· Not smoking. °· Limiting alcoholic beverages. °· Learning ways to reduce stress. ° If lifestyle changes are not enough to get your blood pressure under control, your health care provider may prescribe medicine. You may need to take more than one. Work closely with your health care provider to understand the risks and benefits. °HOME CARE INSTRUCTIONS °· Have your blood pressure rechecked as directed by your health care provider.   °· Take medicines only as directed by your health care provider. Follow the directions carefully. Blood pressure medicines must be taken as prescribed. The medicine does not work as well when you skip doses. Skipping doses also puts you at risk for problems.   °· Do not smoke.   °· Monitor your blood pressure at home as directed by your health care provider.  °SEEK MEDICAL CARE IF:  °· You think you are having a reaction to medicines taken. °· You have recurrent headaches or feel dizzy. °· You have swelling in your ankles. °· You have trouble with your vision. °SEEK IMMEDIATE MEDICAL CARE IF: °· You develop a severe headache or confusion. °·   You have unusual weakness, numbness, or feel faint.  You have severe chest or abdominal pain.  You vomit repeatedly.  You have trouble breathing. MAKE SURE YOU:   Understand these instructions.  Will watch your condition.  Will get help right away if you are not doing well or get worse. Document  Released: 09/07/2005 Document Revised: 01/22/2014 Document Reviewed: 06/30/2013 [facility] Patient Information 2015 [place], [place]. This information is not intended to replace advice given to you by your health care provider. Make sure you discuss any questions you have with your health care provider. End-Stage Kidney Disease The kidneys are two organs that lie on either side of the spine between the middle of the back and the front of the abdomen. The kidneys:   Remove wastes and extra water from the blood.   Produce important hormones. These help keep bones strong, regulate blood pressure, and help create red blood cells.   Balance the fluids and chemicals in the blood and tissues. End-stage kidney disease occurs when the kidneys are so damaged that they cannot do their job. When the kidneys cannot do their job, life-threatening problems occur. The body cannot stay clean and strong without the help of the kidneys. In end-stage kidney disease, the kidneys cannot get better.You need a new kidney or treatments to do some of the work healthy kidneys do in order to stay alive. CAUSES  End-stage kidney disease usually occurs when a long-lasting (chronic) kidney disease gets worse. It may also occur after the kidneys are suddenly damaged (acute kidney injury).  SYMPTOMS   Swelling (edema) of the legs, ankles, or feet.   Tiredness (lethargy).   Nausea or vomiting.   Confusion.   Problems with urination, such as:   Decreased urine production.   Frequent urination, especially at night.   Frequent accidents in children who are potty trained.   Muscle twitches and cramps.   Persistent itchiness.   Loss of appetite.   Headaches.   Abnormally dark or light skin.   Numbness in the hands or feet.   Easy bruising.   Frequent hiccups.   Menstruation stops. DIAGNOSIS  Your health care provider will measure your blood pressure and take some tests. These may include:     Urine tests.   Blood tests.   Imaging tests, such as:   An ultrasound exam.   Computed tomography (CT).  A kidney biopsy. TREATMENT  There are two treatments for end-stage kidney disease:   A procedure that removes toxic wastes from the body (dialysis).   Receiving a new kidney (kidney transplant). Both of these treatments have serious risks and consequences. Your health care provider will help you determine which treatment is best for you based on your health, age, and other factors. In addition to having dialysis or a kidney transplant, you may need to take medicines to control high blood pressure (hypertension) and cholesterol and to decrease phosphorus levels in your blood.  HOME CARE INSTRUCTIONS  Follow your prescribed diet.   Take medicines only as directed by your health care provider.   Do not take any new medicines (prescription, over-the-counter, or nutritional supplements) unless approved by your health care provider. Many medicines can worsen your kidney damage or need to have the dose adjusted.   Keep all follow-up visits as directed by your health care provider. MAKE SURE YOU:  Understand these instructions.  Will watch your condition.  Will get help right away if you are not doing well or get worse. Document Released:  11/28/2003 Document Revised: 01/22/2014 Document Reviewed: 05/06/2012 [employer] Patient Information 2015 [place], [place]. This information is not intended to replace advice given to you by your health care provider. Make sure you discuss any questions you have with your health care provider.

## 2015-06-07 NOTE — ED Notes (Signed)
Patient undressed, in gown, on monitor, continuous pulse oximetry and blood pressure cuff 

## 2015-06-07 NOTE — ED Provider Notes (Signed)
CSN: [id number]     Arrival date & time 06/07/15  1718 History   First MD Initiated Contact with Patient 06/07/15 1751     Chief Complaint  Patient presents with  . Headache  . Hypertension     (Consider location/radiation/quality/duration/timing/severity/associated sxs/prior Treatment) HPI Patient does home hemodialysis. He has multiple medical comorbidities. Today his blood pressure was elevated. The dialysis Center recommended he come to the emergency department for treatment. His blood pressures at home or reading 194/120. He did complete his home dialysis treatment. He states he has a mild diffuse headache. No other associated symptoms. He denies he's been ill recently. He has been doing all of his regular medications and treatments as instructed. He does report that he had been taken off of labetalol and hydralazine up to 6 months ago due to getting hypotensive after dialyzing. The patient now takes only Norvasc daily. He reports he has been taking it and his last dose was this afternoon. He reports his blood pressures have been running 0000000 to [id number] systolic and diastolic in the mid to high 90s consistently pre-dialyzing, post dialysis his blood pressures have been running about [id number] systolic. Past Medical History  Diagnosis Date  . Hypertension   . Gout   . Anemia   . Seizures 06/02/2011  . Thrombocytopenia   . Anxiety   . Hepatitis B carrier   . HIV infection   . Seizure   . Asthma     per pt hx  . End stage renal disease on home HD 07/10/2011    Started HD in September 2012 at [facility] with a tunneled HD catheter, now on home HD with NxtStage. Dialyzing through AVF L lower arm with buttonhole technique as of mid 2014. His brother does the HD treatments at home.  They are roommates for 23 years.  The brother works 3rd shift and gets off about 8am and then puts Mr Malina on HD in the morning after getting home. Most of the time he does HD about 4 times a week, for about 4 hours per  treatment. Cause of ESRD was HTN according to patient. He says he let his health go and ending up with complications, and that he didn't like seeing doctors in those days.  He says he was diagnosed with severe HTN when he lived in [place] in his 42's.    Past Surgical History  Procedure Laterality Date  . Av fistula placement  06/02/11    Left radiocephalic AVF  . Other surgical history      removal temporary HD catheter    Family History  Problem Relation Age of Onset  . Hypertension Mother   . Cancer Father    Social History  Substance Use Topics  . Smoking status: Current Some Day Smoker    Types: Cigarettes  . Smokeless tobacco: Never Used     Comment: not ready to quit yet; 1 pack lasts 2 months.  . Alcohol Use: No    Review of Systems 10 Systems reviewed and are negative for acute change except as noted in the HPI.    Allergies  Lisinopril and Penicillins  Home Medications   Prior to Admission medications   Medication Sig Start Date End Date Taking? Authorizing Provider  albuterol (PROVENTIL) (2.5 MG/3ML) 0.083% nebulizer solution Take 2.5 mg by nebulization every 6 (six) hours as needed for wheezing or shortness of breath.   Yes Historical Provider, MD  amLODipine (NORVASC) 10 MG tablet Take 1 tablet (10  mg total) by mouth daily. 06/27/13  Yes Hester Mates, MD  calcitRIOL (ROCALTROL) 0.25 MCG capsule Take 0.25 mcg by mouth daily.   Yes Historical Provider, MD  Calcium Acetate 667 MG TABS Take 1,334-2,001 mg by mouth 3 (three) times daily with meals. Takes 3 capsules with meals and 2 capsules with snacks   Yes Historical Provider, MD  EPIVIR HBV 5 MG/ML solution TAKE 5 MLS BY MOUTH ONCE DAILY 11/12/14  Yes Campbell Riches, MD  ethyl chloride spray Apply 1 application topically daily as needed. At HD 05/20/15  Yes Historical Provider, MD  ISENTRESS 400 MG tablet TAKE 1 TABLET BY MOUTH TWICE DAILY 03/18/15  Yes Campbell Riches, MD  multivitamin (RENA-VIT) TABS tablet  Take 1 tablet by mouth daily.     Yes Historical Provider, MD  tenofovir (VIREAD) 300 MG tablet Take 1 tablet (300 mg total) by mouth every 7 (seven) days. After dialysis Patient taking differently: Take 300 mg by mouth every Saturday. After dialysis on Saturday 08/28/13  Yes Campbell Riches, MD  traZODone (DESYREL) 50 MG tablet Take 50 mg by mouth at bedtime as needed for sleep.   Yes Historical Provider, MD  hydrALAZINE (APRESOLINE) 10 MG tablet Take 1 tablet (10 mg total) by mouth 4 (four) times daily. 06/07/15   Charlesetta Shanks, MD   BP 172/99 mmHg  Pulse 107  Temp(Src) 98.5 F (36.9 C) (Oral)  Resp 15  Ht 6\' 1"  (1.854 m)  Wt 212 lb 11.9 oz (96.5 kg)  BMI 28.07 kg/m2  SpO2 98% Physical Exam  Constitutional: He is oriented to person, place, and time. He appears well-developed and well-nourished.  HENT:  Head: Normocephalic and atraumatic.  Eyes: EOM are normal. Pupils are equal, round, and reactive to light.  Neck: Neck supple.  Cardiovascular: Normal rate, regular rhythm, normal heart sounds and intact distal pulses.   Pulmonary/Chest: Effort normal and breath sounds normal.  Abdominal: Soft. Bowel sounds are normal. He exhibits no distension. There is no tenderness.  Musculoskeletal: Normal range of motion. He exhibits no edema.  Patient has dressings on his fistula site on the left forearm. No lower extremity edema.  Neurological: He is alert and oriented to person, place, and time. He has normal strength. Coordination normal. GCS eye subscore is 4. GCS verbal subscore is 5. GCS motor subscore is 6.  Skin: Skin is warm, dry and intact.  Psychiatric: He has a normal mood and affect.    ED Course  Procedures (including critical care time) Labs Review Labs Reviewed  BASIC METABOLIC PANEL - Abnormal; Notable for the following:    Sodium 133 (*)    Potassium 3.1 (*)    Chloride 96 (*)    BUN 28 (*)    Creatinine, Ser 8.67 (*)    GFR calc non Af Amer 6 (*)    GFR calc Af Amer  7 (*)    All other components within normal limits  CBC WITH DIFFERENTIAL/PLATELET - Abnormal; Notable for the following:    RBC 3.49 (*)    Hemoglobin 11.0 (*)    HCT 31.5 (*)    Platelets 147 (*)    All other components within normal limits    Imaging Review No results found. I have personally reviewed and evaluated these images and lab results as part of my medical decision-making.   EKG Interpretation   Date/Time:  Friday June 07 2015 17:19:00 EDT Ventricular Rate:  102 PR Interval:  224 QRS Duration: 104  QT Interval:  356 QTC Calculation: 464 R Axis:   -61 Text Interpretation:  Sinus tachycardia Prolonged PR interval Left  anterior fascicular block no change c/w old Confirmed by Johnney Killian, [place] [phone]) on 06/07/2015 6:37:10 PM      MDM   Final diagnoses:  Essential hypertension  ESRD (end stage renal disease)   Patient has well appearance. He is responding to hydralazine and oral labetalol. Pressures are now in the 170s/90s and patient feels improved. Patient previously been on labetalol and hydralazine but currently has been on Norvasc only. At this time I will add hydralazine back to the patient's medication regimen, he and his brother who is his caregiver seem very reliable for home management. They will continue to monitor his pressures and decrease the hydralazine dose right her to dialysis should the pressure seemed to be too low. Instructions are to follow up on Monday for response to therapy. He is instructed to return to the emergency department should there be any changes or concerning symptoms.    Charlesetta Shanks, MD 06/07/15 2200

## 2015-06-08 DIAGNOSIS — Z79899 Other long term (current) drug therapy: Secondary | ICD-10-CM | POA: Diagnosis not present

## 2015-06-08 DIAGNOSIS — D631 Anemia in chronic kidney disease: Secondary | ICD-10-CM | POA: Diagnosis not present

## 2015-06-08 DIAGNOSIS — N186 End stage renal disease: Secondary | ICD-10-CM | POA: Diagnosis not present

## 2015-06-08 DIAGNOSIS — N2581 Secondary hyperparathyroidism of renal origin: Secondary | ICD-10-CM | POA: Diagnosis not present

## 2015-06-08 DIAGNOSIS — D509 Iron deficiency anemia, unspecified: Secondary | ICD-10-CM | POA: Diagnosis not present

## 2015-06-09 DIAGNOSIS — N186 End stage renal disease: Secondary | ICD-10-CM | POA: Diagnosis not present

## 2015-06-09 DIAGNOSIS — N2581 Secondary hyperparathyroidism of renal origin: Secondary | ICD-10-CM | POA: Diagnosis not present

## 2015-06-09 DIAGNOSIS — D509 Iron deficiency anemia, unspecified: Secondary | ICD-10-CM | POA: Diagnosis not present

## 2015-06-09 DIAGNOSIS — D631 Anemia in chronic kidney disease: Secondary | ICD-10-CM | POA: Diagnosis not present

## 2015-06-09 DIAGNOSIS — Z79899 Other long term (current) drug therapy: Secondary | ICD-10-CM | POA: Diagnosis not present

## 2015-06-12 DIAGNOSIS — D631 Anemia in chronic kidney disease: Secondary | ICD-10-CM | POA: Diagnosis not present

## 2015-06-12 DIAGNOSIS — N186 End stage renal disease: Secondary | ICD-10-CM | POA: Diagnosis not present

## 2015-06-12 DIAGNOSIS — Z79899 Other long term (current) drug therapy: Secondary | ICD-10-CM | POA: Diagnosis not present

## 2015-06-12 DIAGNOSIS — D509 Iron deficiency anemia, unspecified: Secondary | ICD-10-CM | POA: Diagnosis not present

## 2015-06-12 DIAGNOSIS — N2581 Secondary hyperparathyroidism of renal origin: Secondary | ICD-10-CM | POA: Diagnosis not present

## 2015-06-15 DIAGNOSIS — D509 Iron deficiency anemia, unspecified: Secondary | ICD-10-CM | POA: Diagnosis not present

## 2015-06-15 DIAGNOSIS — N186 End stage renal disease: Secondary | ICD-10-CM | POA: Diagnosis not present

## 2015-06-15 DIAGNOSIS — N2581 Secondary hyperparathyroidism of renal origin: Secondary | ICD-10-CM | POA: Diagnosis not present

## 2015-06-15 DIAGNOSIS — D631 Anemia in chronic kidney disease: Secondary | ICD-10-CM | POA: Diagnosis not present

## 2015-06-15 DIAGNOSIS — Z79899 Other long term (current) drug therapy: Secondary | ICD-10-CM | POA: Diagnosis not present

## 2015-06-17 DIAGNOSIS — N2581 Secondary hyperparathyroidism of renal origin: Secondary | ICD-10-CM | POA: Diagnosis not present

## 2015-06-17 DIAGNOSIS — N186 End stage renal disease: Secondary | ICD-10-CM | POA: Diagnosis not present

## 2015-06-17 DIAGNOSIS — D631 Anemia in chronic kidney disease: Secondary | ICD-10-CM | POA: Diagnosis not present

## 2015-06-17 DIAGNOSIS — D509 Iron deficiency anemia, unspecified: Secondary | ICD-10-CM | POA: Diagnosis not present

## 2015-06-17 DIAGNOSIS — Z79899 Other long term (current) drug therapy: Secondary | ICD-10-CM | POA: Diagnosis not present

## 2015-06-19 DIAGNOSIS — D631 Anemia in chronic kidney disease: Secondary | ICD-10-CM | POA: Diagnosis not present

## 2015-06-19 DIAGNOSIS — N2581 Secondary hyperparathyroidism of renal origin: Secondary | ICD-10-CM | POA: Diagnosis not present

## 2015-06-19 DIAGNOSIS — N186 End stage renal disease: Secondary | ICD-10-CM | POA: Diagnosis not present

## 2015-06-19 DIAGNOSIS — Z79899 Other long term (current) drug therapy: Secondary | ICD-10-CM | POA: Diagnosis not present

## 2015-06-19 DIAGNOSIS — D509 Iron deficiency anemia, unspecified: Secondary | ICD-10-CM | POA: Diagnosis not present

## 2015-06-21 DIAGNOSIS — N186 End stage renal disease: Secondary | ICD-10-CM | POA: Diagnosis not present

## 2015-06-21 DIAGNOSIS — Z992 Dependence on renal dialysis: Secondary | ICD-10-CM | POA: Diagnosis not present

## 2015-06-21 DIAGNOSIS — D631 Anemia in chronic kidney disease: Secondary | ICD-10-CM | POA: Diagnosis not present

## 2015-06-21 DIAGNOSIS — Z79899 Other long term (current) drug therapy: Secondary | ICD-10-CM | POA: Diagnosis not present

## 2015-06-21 DIAGNOSIS — D509 Iron deficiency anemia, unspecified: Secondary | ICD-10-CM | POA: Diagnosis not present

## 2015-06-21 DIAGNOSIS — I120 Hypertensive chronic kidney disease with stage 5 chronic kidney disease or end stage renal disease: Secondary | ICD-10-CM | POA: Diagnosis not present

## 2015-06-21 DIAGNOSIS — N2581 Secondary hyperparathyroidism of renal origin: Secondary | ICD-10-CM | POA: Diagnosis not present

## 2015-06-23 DIAGNOSIS — N2581 Secondary hyperparathyroidism of renal origin: Secondary | ICD-10-CM | POA: Diagnosis not present

## 2015-06-23 DIAGNOSIS — D509 Iron deficiency anemia, unspecified: Secondary | ICD-10-CM | POA: Diagnosis not present

## 2015-06-23 DIAGNOSIS — D631 Anemia in chronic kidney disease: Secondary | ICD-10-CM | POA: Diagnosis not present

## 2015-06-23 DIAGNOSIS — N186 End stage renal disease: Secondary | ICD-10-CM | POA: Diagnosis not present

## 2015-06-25 DIAGNOSIS — N2581 Secondary hyperparathyroidism of renal origin: Secondary | ICD-10-CM | POA: Diagnosis not present

## 2015-06-25 DIAGNOSIS — N186 End stage renal disease: Secondary | ICD-10-CM | POA: Diagnosis not present

## 2015-06-25 DIAGNOSIS — D509 Iron deficiency anemia, unspecified: Secondary | ICD-10-CM | POA: Diagnosis not present

## 2015-06-25 DIAGNOSIS — D631 Anemia in chronic kidney disease: Secondary | ICD-10-CM | POA: Diagnosis not present

## 2015-06-26 ENCOUNTER — Encounter: Payer: Self-pay | Admitting: Infectious Diseases

## 2015-06-26 ENCOUNTER — Ambulatory Visit (INDEPENDENT_AMBULATORY_CARE_PROVIDER_SITE_OTHER): Payer: Medicare Other | Admitting: Infectious Diseases

## 2015-06-26 VITALS — BP 131/88 | HR 78 | Temp 98.0°F | Ht 72.0 in | Wt 211.6 lb

## 2015-06-26 DIAGNOSIS — N186 End stage renal disease: Secondary | ICD-10-CM | POA: Diagnosis not present

## 2015-06-26 DIAGNOSIS — B20 Human immunodeficiency virus [HIV] disease: Secondary | ICD-10-CM

## 2015-06-26 DIAGNOSIS — Z79899 Other long term (current) drug therapy: Secondary | ICD-10-CM

## 2015-06-26 DIAGNOSIS — Z23 Encounter for immunization: Secondary | ICD-10-CM

## 2015-06-26 DIAGNOSIS — Z113 Encounter for screening for infections with a predominantly sexual mode of transmission: Secondary | ICD-10-CM

## 2015-06-26 NOTE — Assessment & Plan Note (Signed)
Has had f/u of his potassium with renal.  He has f/u again with them this week.  Appreciate their partnering with [name].  His fistula has a good bruit, is clean, well healed.

## 2015-06-26 NOTE — Progress Notes (Signed)
   Subjective:    Patient ID: Jeffrey [name], male    DOB: 01-29-1956, 59 y.o.   MRN: [id number]  HPI 59 yo M with HIV+, Hepatitis B, ESRD, HTN. On 3TC/ISN/TFV.His BP is mildly up today, was normal while getting HD this AM.  No problems with fistula.  Has not colon.   HIV 1 RNA QUANT (copies/mL)  Date Value  04/30/2015 <20  10/23/2014 <20  10/24/2013 <20   CD4 T CELL ABS (/uL)  Date Value  04/30/2015 490  10/23/2014 470  10/24/2013 360*    No problems with ART.  His K was low at 9-16 blood draw (3.1). Has had renal f/u in the intervening period.  Needs flu shot.   Review of Systems  Constitutional: Negative for appetite change and unexpected weight change.  Respiratory: Negative for cough and shortness of breath.   Cardiovascular: Negative for chest pain and leg swelling.  Gastrointestinal: Negative for diarrhea and constipation.  Genitourinary:       Makes small amt of urine.   Neurological: Negative for headaches.      Objective:   Physical Exam  Constitutional: He appears well-developed and well-nourished.  HENT:  Mouth/Throat: No oropharyngeal exudate.  Eyes: EOM are normal. Pupils are equal, round, and reactive to light.  Neck: Neck supple.  Cardiovascular: Normal rate, regular rhythm and normal heart sounds.   Pulmonary/Chest: Effort normal and breath sounds normal.  Abdominal: Soft. Bowel sounds are normal. There is no tenderness. There is no rebound.  Musculoskeletal: He exhibits no edema.  Lymphadenopathy:    He has no cervical adenopathy.       Assessment & Plan:

## 2015-06-26 NOTE — Assessment & Plan Note (Signed)
He is doing well.  Will continue his current art Will get flu shot today.  He needs PCV 13 in 2017 followed by PCV 23.  Offered/refused condoms.  rtc in 6 months

## 2015-06-28 DIAGNOSIS — N2581 Secondary hyperparathyroidism of renal origin: Secondary | ICD-10-CM | POA: Diagnosis not present

## 2015-06-28 DIAGNOSIS — D631 Anemia in chronic kidney disease: Secondary | ICD-10-CM | POA: Diagnosis not present

## 2015-06-28 DIAGNOSIS — D509 Iron deficiency anemia, unspecified: Secondary | ICD-10-CM | POA: Diagnosis not present

## 2015-06-28 DIAGNOSIS — N186 End stage renal disease: Secondary | ICD-10-CM | POA: Diagnosis not present

## 2015-06-30 DIAGNOSIS — N186 End stage renal disease: Secondary | ICD-10-CM | POA: Diagnosis not present

## 2015-06-30 DIAGNOSIS — D631 Anemia in chronic kidney disease: Secondary | ICD-10-CM | POA: Diagnosis not present

## 2015-06-30 DIAGNOSIS — N2581 Secondary hyperparathyroidism of renal origin: Secondary | ICD-10-CM | POA: Diagnosis not present

## 2015-06-30 DIAGNOSIS — D509 Iron deficiency anemia, unspecified: Secondary | ICD-10-CM | POA: Diagnosis not present

## 2015-07-02 DIAGNOSIS — D509 Iron deficiency anemia, unspecified: Secondary | ICD-10-CM | POA: Diagnosis not present

## 2015-07-02 DIAGNOSIS — N186 End stage renal disease: Secondary | ICD-10-CM | POA: Diagnosis not present

## 2015-07-02 DIAGNOSIS — E785 Hyperlipidemia, unspecified: Secondary | ICD-10-CM | POA: Diagnosis not present

## 2015-07-02 DIAGNOSIS — N2581 Secondary hyperparathyroidism of renal origin: Secondary | ICD-10-CM | POA: Diagnosis not present

## 2015-07-02 DIAGNOSIS — D631 Anemia in chronic kidney disease: Secondary | ICD-10-CM | POA: Diagnosis not present

## 2015-07-02 DIAGNOSIS — M109 Gout, unspecified: Secondary | ICD-10-CM | POA: Diagnosis not present

## 2015-07-04 DIAGNOSIS — N2581 Secondary hyperparathyroidism of renal origin: Secondary | ICD-10-CM | POA: Diagnosis not present

## 2015-07-04 DIAGNOSIS — D509 Iron deficiency anemia, unspecified: Secondary | ICD-10-CM | POA: Diagnosis not present

## 2015-07-04 DIAGNOSIS — D631 Anemia in chronic kidney disease: Secondary | ICD-10-CM | POA: Diagnosis not present

## 2015-07-04 DIAGNOSIS — N186 End stage renal disease: Secondary | ICD-10-CM | POA: Diagnosis not present

## 2015-07-06 DIAGNOSIS — D509 Iron deficiency anemia, unspecified: Secondary | ICD-10-CM | POA: Diagnosis not present

## 2015-07-06 DIAGNOSIS — N2581 Secondary hyperparathyroidism of renal origin: Secondary | ICD-10-CM | POA: Diagnosis not present

## 2015-07-06 DIAGNOSIS — N186 End stage renal disease: Secondary | ICD-10-CM | POA: Diagnosis not present

## 2015-07-06 DIAGNOSIS — D631 Anemia in chronic kidney disease: Secondary | ICD-10-CM | POA: Diagnosis not present

## 2015-07-07 DIAGNOSIS — N186 End stage renal disease: Secondary | ICD-10-CM | POA: Diagnosis not present

## 2015-07-07 DIAGNOSIS — D509 Iron deficiency anemia, unspecified: Secondary | ICD-10-CM | POA: Diagnosis not present

## 2015-07-07 DIAGNOSIS — N2581 Secondary hyperparathyroidism of renal origin: Secondary | ICD-10-CM | POA: Diagnosis not present

## 2015-07-07 DIAGNOSIS — D631 Anemia in chronic kidney disease: Secondary | ICD-10-CM | POA: Diagnosis not present

## 2015-07-09 DIAGNOSIS — D509 Iron deficiency anemia, unspecified: Secondary | ICD-10-CM | POA: Diagnosis not present

## 2015-07-09 DIAGNOSIS — N2581 Secondary hyperparathyroidism of renal origin: Secondary | ICD-10-CM | POA: Diagnosis not present

## 2015-07-09 DIAGNOSIS — N186 End stage renal disease: Secondary | ICD-10-CM | POA: Diagnosis not present

## 2015-07-09 DIAGNOSIS — D631 Anemia in chronic kidney disease: Secondary | ICD-10-CM | POA: Diagnosis not present

## 2015-07-10 DIAGNOSIS — D631 Anemia in chronic kidney disease: Secondary | ICD-10-CM | POA: Diagnosis not present

## 2015-07-10 DIAGNOSIS — N186 End stage renal disease: Secondary | ICD-10-CM | POA: Diagnosis not present

## 2015-07-10 DIAGNOSIS — D509 Iron deficiency anemia, unspecified: Secondary | ICD-10-CM | POA: Diagnosis not present

## 2015-07-10 DIAGNOSIS — N2581 Secondary hyperparathyroidism of renal origin: Secondary | ICD-10-CM | POA: Diagnosis not present

## 2015-07-11 DIAGNOSIS — N186 End stage renal disease: Secondary | ICD-10-CM | POA: Diagnosis not present

## 2015-07-11 DIAGNOSIS — N2581 Secondary hyperparathyroidism of renal origin: Secondary | ICD-10-CM | POA: Diagnosis not present

## 2015-07-11 DIAGNOSIS — D509 Iron deficiency anemia, unspecified: Secondary | ICD-10-CM | POA: Diagnosis not present

## 2015-07-11 DIAGNOSIS — D631 Anemia in chronic kidney disease: Secondary | ICD-10-CM | POA: Diagnosis not present

## 2015-07-12 DIAGNOSIS — D509 Iron deficiency anemia, unspecified: Secondary | ICD-10-CM | POA: Diagnosis not present

## 2015-07-12 DIAGNOSIS — D631 Anemia in chronic kidney disease: Secondary | ICD-10-CM | POA: Diagnosis not present

## 2015-07-12 DIAGNOSIS — N186 End stage renal disease: Secondary | ICD-10-CM | POA: Diagnosis not present

## 2015-07-12 DIAGNOSIS — N2581 Secondary hyperparathyroidism of renal origin: Secondary | ICD-10-CM | POA: Diagnosis not present

## 2015-07-14 DIAGNOSIS — D509 Iron deficiency anemia, unspecified: Secondary | ICD-10-CM | POA: Diagnosis not present

## 2015-07-14 DIAGNOSIS — N186 End stage renal disease: Secondary | ICD-10-CM | POA: Diagnosis not present

## 2015-07-14 DIAGNOSIS — D631 Anemia in chronic kidney disease: Secondary | ICD-10-CM | POA: Diagnosis not present

## 2015-07-14 DIAGNOSIS — N2581 Secondary hyperparathyroidism of renal origin: Secondary | ICD-10-CM | POA: Diagnosis not present

## 2015-07-16 DIAGNOSIS — N2581 Secondary hyperparathyroidism of renal origin: Secondary | ICD-10-CM | POA: Diagnosis not present

## 2015-07-16 DIAGNOSIS — D509 Iron deficiency anemia, unspecified: Secondary | ICD-10-CM | POA: Diagnosis not present

## 2015-07-16 DIAGNOSIS — D631 Anemia in chronic kidney disease: Secondary | ICD-10-CM | POA: Diagnosis not present

## 2015-07-16 DIAGNOSIS — N186 End stage renal disease: Secondary | ICD-10-CM | POA: Diagnosis not present

## 2015-07-18 DIAGNOSIS — N186 End stage renal disease: Secondary | ICD-10-CM | POA: Diagnosis not present

## 2015-07-18 DIAGNOSIS — N2581 Secondary hyperparathyroidism of renal origin: Secondary | ICD-10-CM | POA: Diagnosis not present

## 2015-07-18 DIAGNOSIS — D509 Iron deficiency anemia, unspecified: Secondary | ICD-10-CM | POA: Diagnosis not present

## 2015-07-18 DIAGNOSIS — D631 Anemia in chronic kidney disease: Secondary | ICD-10-CM | POA: Diagnosis not present

## 2015-07-20 ENCOUNTER — Other Ambulatory Visit: Payer: Self-pay | Admitting: Infectious Diseases

## 2015-07-20 DIAGNOSIS — N186 End stage renal disease: Secondary | ICD-10-CM | POA: Diagnosis not present

## 2015-07-20 DIAGNOSIS — D631 Anemia in chronic kidney disease: Secondary | ICD-10-CM | POA: Diagnosis not present

## 2015-07-20 DIAGNOSIS — D509 Iron deficiency anemia, unspecified: Secondary | ICD-10-CM | POA: Diagnosis not present

## 2015-07-20 DIAGNOSIS — N2581 Secondary hyperparathyroidism of renal origin: Secondary | ICD-10-CM | POA: Diagnosis not present

## 2015-07-22 ENCOUNTER — Other Ambulatory Visit: Payer: Self-pay | Admitting: *Deleted

## 2015-07-22 DIAGNOSIS — I120 Hypertensive chronic kidney disease with stage 5 chronic kidney disease or end stage renal disease: Secondary | ICD-10-CM | POA: Diagnosis not present

## 2015-07-22 DIAGNOSIS — Z992 Dependence on renal dialysis: Secondary | ICD-10-CM | POA: Diagnosis not present

## 2015-07-22 DIAGNOSIS — B20 Human immunodeficiency virus [HIV] disease: Secondary | ICD-10-CM

## 2015-07-22 DIAGNOSIS — D509 Iron deficiency anemia, unspecified: Secondary | ICD-10-CM | POA: Diagnosis not present

## 2015-07-22 DIAGNOSIS — D631 Anemia in chronic kidney disease: Secondary | ICD-10-CM | POA: Diagnosis not present

## 2015-07-22 DIAGNOSIS — N186 End stage renal disease: Secondary | ICD-10-CM | POA: Diagnosis not present

## 2015-07-22 DIAGNOSIS — N2581 Secondary hyperparathyroidism of renal origin: Secondary | ICD-10-CM | POA: Diagnosis not present

## 2015-07-22 MED ORDER — LAMIVUDINE 5 MG/ML PO SOLN: ORAL | Status: DC

## 2015-07-22 MED ORDER — RALTEGRAVIR POTASSIUM 400 MG PO TABS: 400.0000 mg | ORAL_TABLET | Freq: Two times a day (BID) | ORAL | Status: DC

## 2015-07-22 MED ORDER — TENOFOVIR DISOPROXIL FUMARATE 300 MG PO TABS: 300.0000 mg | ORAL_TABLET | ORAL | Status: DC

## 2015-07-24 DIAGNOSIS — N186 End stage renal disease: Secondary | ICD-10-CM | POA: Diagnosis not present

## 2015-07-24 DIAGNOSIS — D631 Anemia in chronic kidney disease: Secondary | ICD-10-CM | POA: Diagnosis not present

## 2015-07-24 DIAGNOSIS — N2581 Secondary hyperparathyroidism of renal origin: Secondary | ICD-10-CM | POA: Diagnosis not present

## 2015-07-24 DIAGNOSIS — D509 Iron deficiency anemia, unspecified: Secondary | ICD-10-CM | POA: Diagnosis not present

## 2015-07-24 DIAGNOSIS — Z79899 Other long term (current) drug therapy: Secondary | ICD-10-CM | POA: Diagnosis not present

## 2015-07-26 DIAGNOSIS — D509 Iron deficiency anemia, unspecified: Secondary | ICD-10-CM | POA: Diagnosis not present

## 2015-07-26 DIAGNOSIS — Z79899 Other long term (current) drug therapy: Secondary | ICD-10-CM | POA: Diagnosis not present

## 2015-07-26 DIAGNOSIS — N186 End stage renal disease: Secondary | ICD-10-CM | POA: Diagnosis not present

## 2015-07-26 DIAGNOSIS — N2581 Secondary hyperparathyroidism of renal origin: Secondary | ICD-10-CM | POA: Diagnosis not present

## 2015-07-26 DIAGNOSIS — D631 Anemia in chronic kidney disease: Secondary | ICD-10-CM | POA: Diagnosis not present

## 2015-07-29 DIAGNOSIS — D631 Anemia in chronic kidney disease: Secondary | ICD-10-CM | POA: Diagnosis not present

## 2015-07-29 DIAGNOSIS — N2581 Secondary hyperparathyroidism of renal origin: Secondary | ICD-10-CM | POA: Diagnosis not present

## 2015-07-29 DIAGNOSIS — Z79899 Other long term (current) drug therapy: Secondary | ICD-10-CM | POA: Diagnosis not present

## 2015-07-29 DIAGNOSIS — N186 End stage renal disease: Secondary | ICD-10-CM | POA: Diagnosis not present

## 2015-07-29 DIAGNOSIS — D509 Iron deficiency anemia, unspecified: Secondary | ICD-10-CM | POA: Diagnosis not present

## 2015-07-30 DIAGNOSIS — D509 Iron deficiency anemia, unspecified: Secondary | ICD-10-CM | POA: Diagnosis not present

## 2015-07-30 DIAGNOSIS — Z79899 Other long term (current) drug therapy: Secondary | ICD-10-CM | POA: Diagnosis not present

## 2015-07-30 DIAGNOSIS — M109 Gout, unspecified: Secondary | ICD-10-CM | POA: Diagnosis not present

## 2015-07-30 DIAGNOSIS — N186 End stage renal disease: Secondary | ICD-10-CM | POA: Diagnosis not present

## 2015-07-30 DIAGNOSIS — D631 Anemia in chronic kidney disease: Secondary | ICD-10-CM | POA: Diagnosis not present

## 2015-07-30 DIAGNOSIS — N2581 Secondary hyperparathyroidism of renal origin: Secondary | ICD-10-CM | POA: Diagnosis not present

## 2015-07-31 DIAGNOSIS — Z79899 Other long term (current) drug therapy: Secondary | ICD-10-CM | POA: Diagnosis not present

## 2015-07-31 DIAGNOSIS — N186 End stage renal disease: Secondary | ICD-10-CM | POA: Diagnosis not present

## 2015-07-31 DIAGNOSIS — D631 Anemia in chronic kidney disease: Secondary | ICD-10-CM | POA: Diagnosis not present

## 2015-07-31 DIAGNOSIS — D509 Iron deficiency anemia, unspecified: Secondary | ICD-10-CM | POA: Diagnosis not present

## 2015-07-31 DIAGNOSIS — N2581 Secondary hyperparathyroidism of renal origin: Secondary | ICD-10-CM | POA: Diagnosis not present

## 2015-08-02 DIAGNOSIS — Z79899 Other long term (current) drug therapy: Secondary | ICD-10-CM | POA: Diagnosis not present

## 2015-08-02 DIAGNOSIS — N186 End stage renal disease: Secondary | ICD-10-CM | POA: Diagnosis not present

## 2015-08-02 DIAGNOSIS — D509 Iron deficiency anemia, unspecified: Secondary | ICD-10-CM | POA: Diagnosis not present

## 2015-08-02 DIAGNOSIS — D631 Anemia in chronic kidney disease: Secondary | ICD-10-CM | POA: Diagnosis not present

## 2015-08-02 DIAGNOSIS — N2581 Secondary hyperparathyroidism of renal origin: Secondary | ICD-10-CM | POA: Diagnosis not present

## 2015-08-04 DIAGNOSIS — N2581 Secondary hyperparathyroidism of renal origin: Secondary | ICD-10-CM | POA: Diagnosis not present

## 2015-08-04 DIAGNOSIS — N186 End stage renal disease: Secondary | ICD-10-CM | POA: Diagnosis not present

## 2015-08-04 DIAGNOSIS — Z79899 Other long term (current) drug therapy: Secondary | ICD-10-CM | POA: Diagnosis not present

## 2015-08-04 DIAGNOSIS — D631 Anemia in chronic kidney disease: Secondary | ICD-10-CM | POA: Diagnosis not present

## 2015-08-04 DIAGNOSIS — D509 Iron deficiency anemia, unspecified: Secondary | ICD-10-CM | POA: Diagnosis not present

## 2015-08-06 DIAGNOSIS — N186 End stage renal disease: Secondary | ICD-10-CM | POA: Diagnosis not present

## 2015-08-06 DIAGNOSIS — Z79899 Other long term (current) drug therapy: Secondary | ICD-10-CM | POA: Diagnosis not present

## 2015-08-06 DIAGNOSIS — N2581 Secondary hyperparathyroidism of renal origin: Secondary | ICD-10-CM | POA: Diagnosis not present

## 2015-08-06 DIAGNOSIS — D631 Anemia in chronic kidney disease: Secondary | ICD-10-CM | POA: Diagnosis not present

## 2015-08-06 DIAGNOSIS — D509 Iron deficiency anemia, unspecified: Secondary | ICD-10-CM | POA: Diagnosis not present

## 2015-08-08 DIAGNOSIS — D509 Iron deficiency anemia, unspecified: Secondary | ICD-10-CM | POA: Diagnosis not present

## 2015-08-08 DIAGNOSIS — N2581 Secondary hyperparathyroidism of renal origin: Secondary | ICD-10-CM | POA: Diagnosis not present

## 2015-08-08 DIAGNOSIS — D631 Anemia in chronic kidney disease: Secondary | ICD-10-CM | POA: Diagnosis not present

## 2015-08-08 DIAGNOSIS — Z79899 Other long term (current) drug therapy: Secondary | ICD-10-CM | POA: Diagnosis not present

## 2015-08-08 DIAGNOSIS — N186 End stage renal disease: Secondary | ICD-10-CM | POA: Diagnosis not present

## 2015-08-09 DIAGNOSIS — D509 Iron deficiency anemia, unspecified: Secondary | ICD-10-CM | POA: Diagnosis not present

## 2015-08-09 DIAGNOSIS — D631 Anemia in chronic kidney disease: Secondary | ICD-10-CM | POA: Diagnosis not present

## 2015-08-09 DIAGNOSIS — N186 End stage renal disease: Secondary | ICD-10-CM | POA: Diagnosis not present

## 2015-08-09 DIAGNOSIS — Z79899 Other long term (current) drug therapy: Secondary | ICD-10-CM | POA: Diagnosis not present

## 2015-08-09 DIAGNOSIS — N2581 Secondary hyperparathyroidism of renal origin: Secondary | ICD-10-CM | POA: Diagnosis not present

## 2015-08-10 DIAGNOSIS — N2581 Secondary hyperparathyroidism of renal origin: Secondary | ICD-10-CM | POA: Diagnosis not present

## 2015-08-10 DIAGNOSIS — Z79899 Other long term (current) drug therapy: Secondary | ICD-10-CM | POA: Diagnosis not present

## 2015-08-10 DIAGNOSIS — D509 Iron deficiency anemia, unspecified: Secondary | ICD-10-CM | POA: Diagnosis not present

## 2015-08-10 DIAGNOSIS — D631 Anemia in chronic kidney disease: Secondary | ICD-10-CM | POA: Diagnosis not present

## 2015-08-10 DIAGNOSIS — N186 End stage renal disease: Secondary | ICD-10-CM | POA: Diagnosis not present

## 2015-08-12 DIAGNOSIS — Z79899 Other long term (current) drug therapy: Secondary | ICD-10-CM | POA: Diagnosis not present

## 2015-08-12 DIAGNOSIS — D509 Iron deficiency anemia, unspecified: Secondary | ICD-10-CM | POA: Diagnosis not present

## 2015-08-12 DIAGNOSIS — N186 End stage renal disease: Secondary | ICD-10-CM | POA: Diagnosis not present

## 2015-08-12 DIAGNOSIS — D631 Anemia in chronic kidney disease: Secondary | ICD-10-CM | POA: Diagnosis not present

## 2015-08-12 DIAGNOSIS — N2581 Secondary hyperparathyroidism of renal origin: Secondary | ICD-10-CM | POA: Diagnosis not present

## 2015-08-14 DIAGNOSIS — N186 End stage renal disease: Secondary | ICD-10-CM | POA: Diagnosis not present

## 2015-08-14 DIAGNOSIS — D509 Iron deficiency anemia, unspecified: Secondary | ICD-10-CM | POA: Diagnosis not present

## 2015-08-14 DIAGNOSIS — D631 Anemia in chronic kidney disease: Secondary | ICD-10-CM | POA: Diagnosis not present

## 2015-08-14 DIAGNOSIS — N2581 Secondary hyperparathyroidism of renal origin: Secondary | ICD-10-CM | POA: Diagnosis not present

## 2015-08-14 DIAGNOSIS — Z79899 Other long term (current) drug therapy: Secondary | ICD-10-CM | POA: Diagnosis not present

## 2015-08-16 DIAGNOSIS — N2581 Secondary hyperparathyroidism of renal origin: Secondary | ICD-10-CM | POA: Diagnosis not present

## 2015-08-16 DIAGNOSIS — N186 End stage renal disease: Secondary | ICD-10-CM | POA: Diagnosis not present

## 2015-08-16 DIAGNOSIS — Z79899 Other long term (current) drug therapy: Secondary | ICD-10-CM | POA: Diagnosis not present

## 2015-08-16 DIAGNOSIS — D631 Anemia in chronic kidney disease: Secondary | ICD-10-CM | POA: Diagnosis not present

## 2015-08-16 DIAGNOSIS — D509 Iron deficiency anemia, unspecified: Secondary | ICD-10-CM | POA: Diagnosis not present

## 2015-08-18 DIAGNOSIS — D631 Anemia in chronic kidney disease: Secondary | ICD-10-CM | POA: Diagnosis not present

## 2015-08-18 DIAGNOSIS — N2581 Secondary hyperparathyroidism of renal origin: Secondary | ICD-10-CM | POA: Diagnosis not present

## 2015-08-18 DIAGNOSIS — D509 Iron deficiency anemia, unspecified: Secondary | ICD-10-CM | POA: Diagnosis not present

## 2015-08-18 DIAGNOSIS — N186 End stage renal disease: Secondary | ICD-10-CM | POA: Diagnosis not present

## 2015-08-18 DIAGNOSIS — Z79899 Other long term (current) drug therapy: Secondary | ICD-10-CM | POA: Diagnosis not present

## 2015-08-20 DIAGNOSIS — Z79899 Other long term (current) drug therapy: Secondary | ICD-10-CM | POA: Diagnosis not present

## 2015-08-20 DIAGNOSIS — D509 Iron deficiency anemia, unspecified: Secondary | ICD-10-CM | POA: Diagnosis not present

## 2015-08-20 DIAGNOSIS — N2581 Secondary hyperparathyroidism of renal origin: Secondary | ICD-10-CM | POA: Diagnosis not present

## 2015-08-20 DIAGNOSIS — D631 Anemia in chronic kidney disease: Secondary | ICD-10-CM | POA: Diagnosis not present

## 2015-08-20 DIAGNOSIS — N186 End stage renal disease: Secondary | ICD-10-CM | POA: Diagnosis not present

## 2015-08-21 DIAGNOSIS — I120 Hypertensive chronic kidney disease with stage 5 chronic kidney disease or end stage renal disease: Secondary | ICD-10-CM | POA: Diagnosis not present

## 2015-08-21 DIAGNOSIS — N186 End stage renal disease: Secondary | ICD-10-CM | POA: Diagnosis not present

## 2015-08-21 DIAGNOSIS — Z992 Dependence on renal dialysis: Secondary | ICD-10-CM | POA: Diagnosis not present

## 2015-08-22 DIAGNOSIS — N186 End stage renal disease: Secondary | ICD-10-CM | POA: Diagnosis not present

## 2015-08-22 DIAGNOSIS — Z79899 Other long term (current) drug therapy: Secondary | ICD-10-CM | POA: Diagnosis not present

## 2015-08-22 DIAGNOSIS — M109 Gout, unspecified: Secondary | ICD-10-CM | POA: Diagnosis not present

## 2015-08-22 DIAGNOSIS — N2581 Secondary hyperparathyroidism of renal origin: Secondary | ICD-10-CM | POA: Diagnosis not present

## 2015-08-22 DIAGNOSIS — D631 Anemia in chronic kidney disease: Secondary | ICD-10-CM | POA: Diagnosis not present

## 2015-08-22 DIAGNOSIS — D509 Iron deficiency anemia, unspecified: Secondary | ICD-10-CM | POA: Diagnosis not present

## 2015-08-22 DIAGNOSIS — B181 Chronic viral hepatitis B without delta-agent: Secondary | ICD-10-CM | POA: Diagnosis not present

## 2015-08-23 DIAGNOSIS — N2581 Secondary hyperparathyroidism of renal origin: Secondary | ICD-10-CM | POA: Diagnosis not present

## 2015-08-23 DIAGNOSIS — B181 Chronic viral hepatitis B without delta-agent: Secondary | ICD-10-CM | POA: Diagnosis not present

## 2015-08-23 DIAGNOSIS — Z79899 Other long term (current) drug therapy: Secondary | ICD-10-CM | POA: Diagnosis not present

## 2015-08-23 DIAGNOSIS — N186 End stage renal disease: Secondary | ICD-10-CM | POA: Diagnosis not present

## 2015-08-23 DIAGNOSIS — D509 Iron deficiency anemia, unspecified: Secondary | ICD-10-CM | POA: Diagnosis not present

## 2015-08-23 DIAGNOSIS — D631 Anemia in chronic kidney disease: Secondary | ICD-10-CM | POA: Diagnosis not present

## 2015-08-27 DIAGNOSIS — Z79899 Other long term (current) drug therapy: Secondary | ICD-10-CM | POA: Diagnosis not present

## 2015-08-27 DIAGNOSIS — D631 Anemia in chronic kidney disease: Secondary | ICD-10-CM | POA: Diagnosis not present

## 2015-08-27 DIAGNOSIS — B181 Chronic viral hepatitis B without delta-agent: Secondary | ICD-10-CM | POA: Diagnosis not present

## 2015-08-27 DIAGNOSIS — N186 End stage renal disease: Secondary | ICD-10-CM | POA: Diagnosis not present

## 2015-08-27 DIAGNOSIS — D509 Iron deficiency anemia, unspecified: Secondary | ICD-10-CM | POA: Diagnosis not present

## 2015-08-27 DIAGNOSIS — N2581 Secondary hyperparathyroidism of renal origin: Secondary | ICD-10-CM | POA: Diagnosis not present

## 2015-08-29 DIAGNOSIS — N186 End stage renal disease: Secondary | ICD-10-CM | POA: Diagnosis not present

## 2015-08-29 DIAGNOSIS — D509 Iron deficiency anemia, unspecified: Secondary | ICD-10-CM | POA: Diagnosis not present

## 2015-08-29 DIAGNOSIS — B181 Chronic viral hepatitis B without delta-agent: Secondary | ICD-10-CM | POA: Diagnosis not present

## 2015-08-29 DIAGNOSIS — Z79899 Other long term (current) drug therapy: Secondary | ICD-10-CM | POA: Diagnosis not present

## 2015-08-29 DIAGNOSIS — D631 Anemia in chronic kidney disease: Secondary | ICD-10-CM | POA: Diagnosis not present

## 2015-08-29 DIAGNOSIS — N2581 Secondary hyperparathyroidism of renal origin: Secondary | ICD-10-CM | POA: Diagnosis not present

## 2015-08-31 DIAGNOSIS — D509 Iron deficiency anemia, unspecified: Secondary | ICD-10-CM | POA: Diagnosis not present

## 2015-08-31 DIAGNOSIS — B181 Chronic viral hepatitis B without delta-agent: Secondary | ICD-10-CM | POA: Diagnosis not present

## 2015-08-31 DIAGNOSIS — N186 End stage renal disease: Secondary | ICD-10-CM | POA: Diagnosis not present

## 2015-08-31 DIAGNOSIS — N2581 Secondary hyperparathyroidism of renal origin: Secondary | ICD-10-CM | POA: Diagnosis not present

## 2015-08-31 DIAGNOSIS — D631 Anemia in chronic kidney disease: Secondary | ICD-10-CM | POA: Diagnosis not present

## 2015-08-31 DIAGNOSIS — Z79899 Other long term (current) drug therapy: Secondary | ICD-10-CM | POA: Diagnosis not present

## 2015-09-01 DIAGNOSIS — N186 End stage renal disease: Secondary | ICD-10-CM | POA: Diagnosis not present

## 2015-09-01 DIAGNOSIS — B181 Chronic viral hepatitis B without delta-agent: Secondary | ICD-10-CM | POA: Diagnosis not present

## 2015-09-01 DIAGNOSIS — D509 Iron deficiency anemia, unspecified: Secondary | ICD-10-CM | POA: Diagnosis not present

## 2015-09-01 DIAGNOSIS — D631 Anemia in chronic kidney disease: Secondary | ICD-10-CM | POA: Diagnosis not present

## 2015-09-01 DIAGNOSIS — Z79899 Other long term (current) drug therapy: Secondary | ICD-10-CM | POA: Diagnosis not present

## 2015-09-01 DIAGNOSIS — N2581 Secondary hyperparathyroidism of renal origin: Secondary | ICD-10-CM | POA: Diagnosis not present

## 2015-09-03 DIAGNOSIS — B181 Chronic viral hepatitis B without delta-agent: Secondary | ICD-10-CM | POA: Diagnosis not present

## 2015-09-03 DIAGNOSIS — D631 Anemia in chronic kidney disease: Secondary | ICD-10-CM | POA: Diagnosis not present

## 2015-09-03 DIAGNOSIS — Z79899 Other long term (current) drug therapy: Secondary | ICD-10-CM | POA: Diagnosis not present

## 2015-09-03 DIAGNOSIS — N186 End stage renal disease: Secondary | ICD-10-CM | POA: Diagnosis not present

## 2015-09-03 DIAGNOSIS — N2581 Secondary hyperparathyroidism of renal origin: Secondary | ICD-10-CM | POA: Diagnosis not present

## 2015-09-03 DIAGNOSIS — D509 Iron deficiency anemia, unspecified: Secondary | ICD-10-CM | POA: Diagnosis not present

## 2015-09-05 DIAGNOSIS — D509 Iron deficiency anemia, unspecified: Secondary | ICD-10-CM | POA: Diagnosis not present

## 2015-09-05 DIAGNOSIS — N186 End stage renal disease: Secondary | ICD-10-CM | POA: Diagnosis not present

## 2015-09-05 DIAGNOSIS — Z79899 Other long term (current) drug therapy: Secondary | ICD-10-CM | POA: Diagnosis not present

## 2015-09-05 DIAGNOSIS — B181 Chronic viral hepatitis B without delta-agent: Secondary | ICD-10-CM | POA: Diagnosis not present

## 2015-09-05 DIAGNOSIS — N2581 Secondary hyperparathyroidism of renal origin: Secondary | ICD-10-CM | POA: Diagnosis not present

## 2015-09-05 DIAGNOSIS — D631 Anemia in chronic kidney disease: Secondary | ICD-10-CM | POA: Diagnosis not present

## 2015-09-07 DIAGNOSIS — N186 End stage renal disease: Secondary | ICD-10-CM | POA: Diagnosis not present

## 2015-09-07 DIAGNOSIS — B181 Chronic viral hepatitis B without delta-agent: Secondary | ICD-10-CM | POA: Diagnosis not present

## 2015-09-07 DIAGNOSIS — Z79899 Other long term (current) drug therapy: Secondary | ICD-10-CM | POA: Diagnosis not present

## 2015-09-07 DIAGNOSIS — N2581 Secondary hyperparathyroidism of renal origin: Secondary | ICD-10-CM | POA: Diagnosis not present

## 2015-09-07 DIAGNOSIS — D509 Iron deficiency anemia, unspecified: Secondary | ICD-10-CM | POA: Diagnosis not present

## 2015-09-07 DIAGNOSIS — D631 Anemia in chronic kidney disease: Secondary | ICD-10-CM | POA: Diagnosis not present

## 2015-09-08 DIAGNOSIS — N186 End stage renal disease: Secondary | ICD-10-CM | POA: Diagnosis not present

## 2015-09-08 DIAGNOSIS — Z79899 Other long term (current) drug therapy: Secondary | ICD-10-CM | POA: Diagnosis not present

## 2015-09-08 DIAGNOSIS — B181 Chronic viral hepatitis B without delta-agent: Secondary | ICD-10-CM | POA: Diagnosis not present

## 2015-09-08 DIAGNOSIS — D509 Iron deficiency anemia, unspecified: Secondary | ICD-10-CM | POA: Diagnosis not present

## 2015-09-08 DIAGNOSIS — N2581 Secondary hyperparathyroidism of renal origin: Secondary | ICD-10-CM | POA: Diagnosis not present

## 2015-09-08 DIAGNOSIS — D631 Anemia in chronic kidney disease: Secondary | ICD-10-CM | POA: Diagnosis not present

## 2015-09-10 DIAGNOSIS — B181 Chronic viral hepatitis B without delta-agent: Secondary | ICD-10-CM | POA: Diagnosis not present

## 2015-09-10 DIAGNOSIS — N186 End stage renal disease: Secondary | ICD-10-CM | POA: Diagnosis not present

## 2015-09-10 DIAGNOSIS — D509 Iron deficiency anemia, unspecified: Secondary | ICD-10-CM | POA: Diagnosis not present

## 2015-09-10 DIAGNOSIS — D631 Anemia in chronic kidney disease: Secondary | ICD-10-CM | POA: Diagnosis not present

## 2015-09-10 DIAGNOSIS — Z79899 Other long term (current) drug therapy: Secondary | ICD-10-CM | POA: Diagnosis not present

## 2015-09-10 DIAGNOSIS — N2581 Secondary hyperparathyroidism of renal origin: Secondary | ICD-10-CM | POA: Diagnosis not present

## 2015-09-11 DIAGNOSIS — B181 Chronic viral hepatitis B without delta-agent: Secondary | ICD-10-CM | POA: Diagnosis not present

## 2015-09-11 DIAGNOSIS — N186 End stage renal disease: Secondary | ICD-10-CM | POA: Diagnosis not present

## 2015-09-11 DIAGNOSIS — D631 Anemia in chronic kidney disease: Secondary | ICD-10-CM | POA: Diagnosis not present

## 2015-09-11 DIAGNOSIS — Z79899 Other long term (current) drug therapy: Secondary | ICD-10-CM | POA: Diagnosis not present

## 2015-09-11 DIAGNOSIS — D509 Iron deficiency anemia, unspecified: Secondary | ICD-10-CM | POA: Diagnosis not present

## 2015-09-11 DIAGNOSIS — N2581 Secondary hyperparathyroidism of renal origin: Secondary | ICD-10-CM | POA: Diagnosis not present

## 2015-09-12 DIAGNOSIS — N186 End stage renal disease: Secondary | ICD-10-CM | POA: Diagnosis not present

## 2015-09-12 DIAGNOSIS — Z79899 Other long term (current) drug therapy: Secondary | ICD-10-CM | POA: Diagnosis not present

## 2015-09-12 DIAGNOSIS — D509 Iron deficiency anemia, unspecified: Secondary | ICD-10-CM | POA: Diagnosis not present

## 2015-09-12 DIAGNOSIS — B181 Chronic viral hepatitis B without delta-agent: Secondary | ICD-10-CM | POA: Diagnosis not present

## 2015-09-12 DIAGNOSIS — N2581 Secondary hyperparathyroidism of renal origin: Secondary | ICD-10-CM | POA: Diagnosis not present

## 2015-09-12 DIAGNOSIS — D631 Anemia in chronic kidney disease: Secondary | ICD-10-CM | POA: Diagnosis not present

## 2015-09-14 DIAGNOSIS — D631 Anemia in chronic kidney disease: Secondary | ICD-10-CM | POA: Diagnosis not present

## 2015-09-14 DIAGNOSIS — N2581 Secondary hyperparathyroidism of renal origin: Secondary | ICD-10-CM | POA: Diagnosis not present

## 2015-09-14 DIAGNOSIS — D509 Iron deficiency anemia, unspecified: Secondary | ICD-10-CM | POA: Diagnosis not present

## 2015-09-14 DIAGNOSIS — N186 End stage renal disease: Secondary | ICD-10-CM | POA: Diagnosis not present

## 2015-09-14 DIAGNOSIS — B181 Chronic viral hepatitis B without delta-agent: Secondary | ICD-10-CM | POA: Diagnosis not present

## 2015-09-14 DIAGNOSIS — Z79899 Other long term (current) drug therapy: Secondary | ICD-10-CM | POA: Diagnosis not present

## 2015-09-15 DIAGNOSIS — D509 Iron deficiency anemia, unspecified: Secondary | ICD-10-CM | POA: Diagnosis not present

## 2015-09-15 DIAGNOSIS — Z79899 Other long term (current) drug therapy: Secondary | ICD-10-CM | POA: Diagnosis not present

## 2015-09-15 DIAGNOSIS — N186 End stage renal disease: Secondary | ICD-10-CM | POA: Diagnosis not present

## 2015-09-15 DIAGNOSIS — B181 Chronic viral hepatitis B without delta-agent: Secondary | ICD-10-CM | POA: Diagnosis not present

## 2015-09-15 DIAGNOSIS — N2581 Secondary hyperparathyroidism of renal origin: Secondary | ICD-10-CM | POA: Diagnosis not present

## 2015-09-15 DIAGNOSIS — D631 Anemia in chronic kidney disease: Secondary | ICD-10-CM | POA: Diagnosis not present

## 2015-09-17 DIAGNOSIS — D631 Anemia in chronic kidney disease: Secondary | ICD-10-CM | POA: Diagnosis not present

## 2015-09-17 DIAGNOSIS — Z79899 Other long term (current) drug therapy: Secondary | ICD-10-CM | POA: Diagnosis not present

## 2015-09-17 DIAGNOSIS — D509 Iron deficiency anemia, unspecified: Secondary | ICD-10-CM | POA: Diagnosis not present

## 2015-09-17 DIAGNOSIS — N2581 Secondary hyperparathyroidism of renal origin: Secondary | ICD-10-CM | POA: Diagnosis not present

## 2015-09-17 DIAGNOSIS — B181 Chronic viral hepatitis B without delta-agent: Secondary | ICD-10-CM | POA: Diagnosis not present

## 2015-09-17 DIAGNOSIS — N186 End stage renal disease: Secondary | ICD-10-CM | POA: Diagnosis not present

## 2015-09-18 DIAGNOSIS — R42 Dizziness and giddiness: Secondary | ICD-10-CM | POA: Diagnosis not present

## 2015-09-18 DIAGNOSIS — R404 Transient alteration of awareness: Secondary | ICD-10-CM | POA: Diagnosis not present

## 2015-09-19 DIAGNOSIS — D509 Iron deficiency anemia, unspecified: Secondary | ICD-10-CM | POA: Diagnosis not present

## 2015-09-19 DIAGNOSIS — Z79899 Other long term (current) drug therapy: Secondary | ICD-10-CM | POA: Diagnosis not present

## 2015-09-19 DIAGNOSIS — N186 End stage renal disease: Secondary | ICD-10-CM | POA: Diagnosis not present

## 2015-09-19 DIAGNOSIS — N2581 Secondary hyperparathyroidism of renal origin: Secondary | ICD-10-CM | POA: Diagnosis not present

## 2015-09-19 DIAGNOSIS — D631 Anemia in chronic kidney disease: Secondary | ICD-10-CM | POA: Diagnosis not present

## 2015-09-19 DIAGNOSIS — B181 Chronic viral hepatitis B without delta-agent: Secondary | ICD-10-CM | POA: Diagnosis not present

## 2015-09-21 DIAGNOSIS — B181 Chronic viral hepatitis B without delta-agent: Secondary | ICD-10-CM | POA: Diagnosis not present

## 2015-09-21 DIAGNOSIS — N2581 Secondary hyperparathyroidism of renal origin: Secondary | ICD-10-CM | POA: Diagnosis not present

## 2015-09-21 DIAGNOSIS — D631 Anemia in chronic kidney disease: Secondary | ICD-10-CM | POA: Diagnosis not present

## 2015-09-21 DIAGNOSIS — N186 End stage renal disease: Secondary | ICD-10-CM | POA: Diagnosis not present

## 2015-09-21 DIAGNOSIS — D509 Iron deficiency anemia, unspecified: Secondary | ICD-10-CM | POA: Diagnosis not present

## 2015-09-21 DIAGNOSIS — Z992 Dependence on renal dialysis: Secondary | ICD-10-CM | POA: Diagnosis not present

## 2015-09-21 DIAGNOSIS — Z79899 Other long term (current) drug therapy: Secondary | ICD-10-CM | POA: Diagnosis not present

## 2015-09-21 DIAGNOSIS — I120 Hypertensive chronic kidney disease with stage 5 chronic kidney disease or end stage renal disease: Secondary | ICD-10-CM | POA: Diagnosis not present

## 2015-09-23 DIAGNOSIS — D631 Anemia in chronic kidney disease: Secondary | ICD-10-CM | POA: Diagnosis not present

## 2015-09-23 DIAGNOSIS — D509 Iron deficiency anemia, unspecified: Secondary | ICD-10-CM | POA: Diagnosis not present

## 2015-09-23 DIAGNOSIS — N2581 Secondary hyperparathyroidism of renal origin: Secondary | ICD-10-CM | POA: Diagnosis not present

## 2015-09-23 DIAGNOSIS — N186 End stage renal disease: Secondary | ICD-10-CM | POA: Diagnosis not present

## 2015-09-25 DIAGNOSIS — D509 Iron deficiency anemia, unspecified: Secondary | ICD-10-CM | POA: Diagnosis not present

## 2015-09-25 DIAGNOSIS — D631 Anemia in chronic kidney disease: Secondary | ICD-10-CM | POA: Diagnosis not present

## 2015-09-25 DIAGNOSIS — N2581 Secondary hyperparathyroidism of renal origin: Secondary | ICD-10-CM | POA: Diagnosis not present

## 2015-09-25 DIAGNOSIS — N186 End stage renal disease: Secondary | ICD-10-CM | POA: Diagnosis not present

## 2015-09-26 DIAGNOSIS — N186 End stage renal disease: Secondary | ICD-10-CM | POA: Diagnosis not present

## 2015-09-26 DIAGNOSIS — D631 Anemia in chronic kidney disease: Secondary | ICD-10-CM | POA: Diagnosis not present

## 2015-09-26 DIAGNOSIS — N2581 Secondary hyperparathyroidism of renal origin: Secondary | ICD-10-CM | POA: Diagnosis not present

## 2015-09-26 DIAGNOSIS — D509 Iron deficiency anemia, unspecified: Secondary | ICD-10-CM | POA: Diagnosis not present

## 2015-09-27 DIAGNOSIS — D631 Anemia in chronic kidney disease: Secondary | ICD-10-CM | POA: Diagnosis not present

## 2015-09-27 DIAGNOSIS — N2581 Secondary hyperparathyroidism of renal origin: Secondary | ICD-10-CM | POA: Diagnosis not present

## 2015-09-27 DIAGNOSIS — D509 Iron deficiency anemia, unspecified: Secondary | ICD-10-CM | POA: Diagnosis not present

## 2015-09-27 DIAGNOSIS — N186 End stage renal disease: Secondary | ICD-10-CM | POA: Diagnosis not present

## 2015-09-28 ENCOUNTER — Other Ambulatory Visit: Payer: Self-pay | Admitting: Infectious Diseases

## 2015-09-29 DIAGNOSIS — D509 Iron deficiency anemia, unspecified: Secondary | ICD-10-CM | POA: Diagnosis not present

## 2015-09-29 DIAGNOSIS — D631 Anemia in chronic kidney disease: Secondary | ICD-10-CM | POA: Diagnosis not present

## 2015-09-29 DIAGNOSIS — N2581 Secondary hyperparathyroidism of renal origin: Secondary | ICD-10-CM | POA: Diagnosis not present

## 2015-09-29 DIAGNOSIS — N186 End stage renal disease: Secondary | ICD-10-CM | POA: Diagnosis not present

## 2015-10-02 DIAGNOSIS — N2581 Secondary hyperparathyroidism of renal origin: Secondary | ICD-10-CM | POA: Diagnosis not present

## 2015-10-02 DIAGNOSIS — D631 Anemia in chronic kidney disease: Secondary | ICD-10-CM | POA: Diagnosis not present

## 2015-10-02 DIAGNOSIS — E785 Hyperlipidemia, unspecified: Secondary | ICD-10-CM | POA: Diagnosis not present

## 2015-10-02 DIAGNOSIS — N186 End stage renal disease: Secondary | ICD-10-CM | POA: Diagnosis not present

## 2015-10-02 DIAGNOSIS — D509 Iron deficiency anemia, unspecified: Secondary | ICD-10-CM | POA: Diagnosis not present

## 2015-10-02 DIAGNOSIS — M109 Gout, unspecified: Secondary | ICD-10-CM | POA: Diagnosis not present

## 2015-10-03 DIAGNOSIS — N186 End stage renal disease: Secondary | ICD-10-CM | POA: Diagnosis not present

## 2015-10-03 DIAGNOSIS — D509 Iron deficiency anemia, unspecified: Secondary | ICD-10-CM | POA: Diagnosis not present

## 2015-10-03 DIAGNOSIS — N2581 Secondary hyperparathyroidism of renal origin: Secondary | ICD-10-CM | POA: Diagnosis not present

## 2015-10-03 DIAGNOSIS — D631 Anemia in chronic kidney disease: Secondary | ICD-10-CM | POA: Diagnosis not present

## 2015-10-05 DIAGNOSIS — D509 Iron deficiency anemia, unspecified: Secondary | ICD-10-CM | POA: Diagnosis not present

## 2015-10-05 DIAGNOSIS — D631 Anemia in chronic kidney disease: Secondary | ICD-10-CM | POA: Diagnosis not present

## 2015-10-05 DIAGNOSIS — N2581 Secondary hyperparathyroidism of renal origin: Secondary | ICD-10-CM | POA: Diagnosis not present

## 2015-10-05 DIAGNOSIS — N186 End stage renal disease: Secondary | ICD-10-CM | POA: Diagnosis not present

## 2015-10-07 DIAGNOSIS — N2581 Secondary hyperparathyroidism of renal origin: Secondary | ICD-10-CM | POA: Diagnosis not present

## 2015-10-07 DIAGNOSIS — D509 Iron deficiency anemia, unspecified: Secondary | ICD-10-CM | POA: Diagnosis not present

## 2015-10-07 DIAGNOSIS — D631 Anemia in chronic kidney disease: Secondary | ICD-10-CM | POA: Diagnosis not present

## 2015-10-07 DIAGNOSIS — N186 End stage renal disease: Secondary | ICD-10-CM | POA: Diagnosis not present

## 2015-10-08 ENCOUNTER — Other Ambulatory Visit: Payer: Self-pay

## 2015-10-08 ENCOUNTER — Ambulatory Visit (INDEPENDENT_AMBULATORY_CARE_PROVIDER_SITE_OTHER): Payer: Medicare Other | Admitting: Vascular Surgery

## 2015-10-08 ENCOUNTER — Encounter: Payer: Self-pay | Admitting: Vascular Surgery

## 2015-10-08 VITALS — BP 158/98 | HR 88 | Temp 98.7°F | Resp 16 | Wt 214.0 lb

## 2015-10-08 DIAGNOSIS — N186 End stage renal disease: Secondary | ICD-10-CM

## 2015-10-08 NOTE — Progress Notes (Signed)
Subjective:     Patient ID: Jeffrey [name], male   DOB: 1956-05-14, 60 y.o.   MRN: [id number]  HPI This 60 year old male with end-stage renal disease has hemodialysis at home various days of the week depending on his schedule. Patient has had to episodes of spontaneous bleeding from an old buttonhole site near the arterial anastomosis of the left radial-cephalic AV fistula. One occurred on Friday January 13 and 1 occurred earlier today. Fistula continues to function nicely in this area is not utilized as a puncture site.   Past Medical History  Diagnosis Date  . Hypertension   . Gout   . Anemia   . Seizures ([place]) 06/02/2011  . Thrombocytopenia ([place])   . Anxiety   . Hepatitis B carrier   . HIV infection ([place])   . Seizure ([place])   . Asthma     per pt hx  . End stage renal disease on home HD 07/10/2011    Started HD in September 2012 at [facility] with a tunneled HD catheter, now on home HD with NxtStage. Dialyzing through AVF L lower arm with buttonhole technique as of mid 2014. His brother does the HD treatments at home.  They are roommates for 23 years.  The brother works 3rd shift and gets off about 8am and then puts Jeffrey [name] on HD in the morning after getting home. Most of the time he does HD about 4 times a week, for about 4 hours per treatment. Cause of ESRD was HTN according to patient. He says he let his health go and ending up with complications, and that he didn't like seeing doctors in those days.  He says he was diagnosed with severe HTN when he lived in [place] in his 1's.     Social History  Substance Use Topics  . Smoking status: Former Smoker    Types: Cigarettes    Quit date: 08/08/2015  . Smokeless tobacco: Never Used     Comment: not ready to quit yet; 1 pack lasts 2 months. "very occassional"  . Alcohol Use: No    Family History  Problem Relation Age of Onset  . Hypertension Mother   . Cancer Father     Allergies  Allergen Reactions  . Lisinopril Shortness Of Breath  and Swelling    Throat swelling  . Penicillins Other (See Comments)    Childhood allergy     Current outpatient prescriptions:  .  albuterol (PROVENTIL) (2.5 MG/3ML) 0.083% nebulizer solution, Take 2.5 mg by nebulization every 6 (six) hours as needed for wheezing or shortness of breath., Disp: , Rfl:  .  amLODipine (NORVASC) 5 MG tablet, TK 1 T PO QD . MAY TAKE A SECOND TABLET BASED ON BP AFTER DIALYSIS, Disp: , Rfl: 10 .  calcitRIOL (ROCALTROL) 0.25 MCG capsule, Take 0.25 mcg by mouth daily., Disp: , Rfl:  .  calcium acetate (PHOSLO) 667 MG capsule, 3 caps with a meal, 2 caps with a snack, Disp: , Rfl:  .  carvedilol (COREG) 12.5 MG tablet, TK 25 mg  T PO qd  FOR HIGH BLOOD PRESSURE, Disp: , Rfl: 12 .  cephALEXin (KEFLEX) 250 MG capsule, 2 (two) times daily., Disp: , Rfl:  .  cloNIDine (CATAPRES) 0.1 MG tablet, 1 tablet 2 (two) times daily., Disp: , Rfl: 6 .  EPIVIR HBV 5 MG/ML solution, TAKE 5 ML BY MOUTH ONCE DAILY, Disp: 240 mL, Rfl: 3 .  ethyl chloride spray, Apply 1 application topically daily as needed.  At HD, Disp: , Rfl: 12 .  lamivudine (EPIVIR HBV) 5 MG/ML solution, TAKE 5 MLS BY MOUTH ONCE DAILY, Disp: 240 mL, Rfl: 5 .  multivitamin (RENA-VIT) TABS tablet, Take 1 tablet by mouth daily.  , Disp: , Rfl:  .  raltegravir (ISENTRESS) 400 MG tablet, Take 1 tablet (400 mg total) by mouth 2 (two) times daily., Disp: 60 tablet, Rfl: 5 .  tenofovir (VIREAD) 300 MG tablet, Take 1 tablet (300 mg total) by mouth every Saturday. After dialysis on Saturday, Disp: 5 tablet, Rfl: 5 .  traZODone (DESYREL) 50 MG tablet, Take 50 mg by mouth at bedtime as needed for sleep., Disp: , Rfl:   Filed Vitals:   10/08/15 1108 10/08/15 1117  BP: 154/94 158/98  Pulse: 92 88  Temp: 98.7 F (37.1 C)   TempSrc: Oral   Resp: 16   Weight: 214 lb (97.07 kg)   SpO2: 98%     Body mass index is 29.02 kg/(m^2).           Review of Systems Patient has history of hepatitis C, HIV positive ,  hypertension, gout. Denies chest pain, dyspnea on exertion, PND, orthopnea, hemoptysis, claudication. His fistula was created in 2012. On home hemodialysis. All systems otherwise negative and a complete review of systems     Objective:   Physical Exam BP 158/98 mmHg  Pulse 88  Temp(Src) 98.7 F (37.1 C) (Oral)  Resp 16  Wt 214 lb (97.07 kg)  SpO2 98%  Gen.-alert and oriented x3 in no apparent distress HEENT normal for age Lungs no rhonchi or wheezing Cardiovascular regular rhythm no murmurs carotid pulses 3+ palpable no bruits audible Abdomen soft nontender no palpable masses Musculoskeletal free of  major deformities Skin clear -no rashes Neurologic normal Lower extremities 3+ femoral and dorsalis pedis pulses palpable bilaterally with no edema  left upper extremity with excellent pulse and palpable thrill and radial-cephalic AV fistula. Eschar overlying old buttonhole site about 6 cm from arterial anastomosis. Mild aneurysmal dilatation. Other puncture sites are clean more proximally in the arm. Left and well perfused.      Assessment:      end-stage renal disease on home hemodialysis  2 episodes of spontaneous bleeding from  Old buttonhole puncture site   HIV positive  hepatitis C carrier  hypertension     Plan:      plan revision left radial-cephalic AV fistula by either Dr. Bridgett Larsson or Doren Custard on Thursday of this week to hopefully prevent any other episodes of spontaneous bleeding Discuss this with patient and he would like to proceed

## 2015-10-08 NOTE — Progress Notes (Signed)
Filed Vitals:   10/08/15 1108 10/08/15 1117  BP: 154/94 158/98  Pulse: 92 88  Temp: 98.7 F (37.1 C)   TempSrc: Oral   Resp: 16   Weight: 214 lb (97.07 kg)   SpO2: 98%

## 2015-10-09 ENCOUNTER — Other Ambulatory Visit: Payer: Self-pay | Admitting: *Deleted

## 2015-10-09 ENCOUNTER — Encounter (HOSPITAL_COMMUNITY): Payer: Self-pay | Admitting: *Deleted

## 2015-10-09 NOTE — Progress Notes (Signed)
Pt denies cardiac history, chest pain or sob.   Pt states that he is having surgery on his LEFT arm, not his right. The consent order has right. I called and left message at Dr. Nicole Cella office that the consent order needs to be changed.   Echo - 01/09/13 - in EPIC EKG - 06/07/15 - in EPIC

## 2015-10-09 NOTE — Progress Notes (Signed)
Anesthesia Chart Review: Patient is a 60 year old male scheduled for revision of AVF (posted for right, but reported as left so consent to be clarified prior to surgery) by Dr. Kellie Simmering. Patient was seen on 10/08/15. He is on home hemodialysis and had spontaneous bleeding from an old buttonhole site near the arterial anastomosis of his left radiocephalic AVF. One episode on 10/04/15 and another on 10/08/15. Revision was recommended to hopefully prevent another episode of spontaneous bleeding.   Special Needs: Dr. Kellie Simmering requesting LMA for anesthesia.  History includes ESRD on home HD, HIV positive, hepatitis B, HTN, anxiety, anemia, gout, thrombocytopenia, secondary hyperparathyroidism, asthma, former smoker since 08/08/15. He was sent to [facility] evaluation but after no-show X 2 in 06/2015 his case was closed and would need new referral. [facility] transplant committee did not feel he made selection criteria to go on their transplant list back in 04/2014.  Nephrologist is Dr. Joelyn Oms. ID is Dr. Johnnye Sima, last visit 06/2015.   Meds include albuterol, amlodipine, Phoslo, Rocaltrol, Coreg, Keflex, clonidine, Epivir, Rena-vit, raltegravir, tenofovir, trazodone.  06/07/15 EKG: ST at 102, first degree AVB, LAFB. Overall, appears stable when compared to 07/31/14 tracing.  04/20/14 Stress Echo ([facility], see Care Everywhere): STRESS ECG RESULTS ----------------------------------------------------------- ECG Results: Normal INTERPRETATION --------------------------------------------------------------- NORMAL STRESS TEST. NO VALVULAR REGURGITATION NO VALVULAR STENOSIS Note: NORMAL RESTING STUDY WITH NO WALL MOTION ABNORMALITIES AT REST AND PEAK STRESS. UNDER FILLED LV CAVITY WITH MID CAVITY GRADIENT 1.80m/s, 65mmHg AT REST , 2.72m/s, 58mmHg WITH VALSALVA NORMAL RESTING BP - EXAGGERATED RESPONSE  01/09/13 Echo: Study Conclusions - Left ventricle: The cavity size was mildly dilated. Systolic function was  normal. The estimated ejection fraction was in the range of 60% to 65%. Wall motion was normal; there were no regional wall motion abnormalities. - Aortic valve: Trivial regurgitation. - Mitral valve: Mild regurgitation. - Left atrium: The atrium was moderately dilated. - Right atrium: The atrium was mildly dilated.  Spirometry 06/13/13 showed very severe airflow limitation with significant improvement in airflows after BD.  He is for labs on arrival. If labs are acceptable and otherwise no acute changes then I would anticipate that he could proceed as planned.  [facility]/Anesthesiology Phone [phone] 10/09/2015 3:41 PM

## 2015-10-10 ENCOUNTER — Ambulatory Visit (HOSPITAL_COMMUNITY): Payer: Medicare Other | Admitting: Vascular Surgery

## 2015-10-10 ENCOUNTER — Encounter (HOSPITAL_COMMUNITY): Payer: Self-pay | Admitting: *Deleted

## 2015-10-10 ENCOUNTER — Ambulatory Visit (HOSPITAL_COMMUNITY)
Admission: RE | Admit: 2015-10-10 | Discharge: 2015-10-10 | Disposition: A | Discharge status: Home / Self Care | Payer: Medicare Other | Source: Ambulatory Visit | Attending: Vascular Surgery | Admitting: Vascular Surgery

## 2015-10-10 ENCOUNTER — Encounter (HOSPITAL_COMMUNITY)
Admission: RE | Disposition: A | Discharge status: Home / Self Care | Payer: Self-pay | Source: Ambulatory Visit | Attending: Vascular Surgery

## 2015-10-10 DIAGNOSIS — N2581 Secondary hyperparathyroidism of renal origin: Secondary | ICD-10-CM | POA: Diagnosis not present

## 2015-10-10 DIAGNOSIS — Z79899 Other long term (current) drug therapy: Secondary | ICD-10-CM | POA: Diagnosis not present

## 2015-10-10 DIAGNOSIS — Y832 Surgical operation with anastomosis, bypass or graft as the cause of abnormal reaction of the patient, or of later complication, without mention of misadventure at the time of the procedure: Secondary | ICD-10-CM | POA: Insufficient documentation

## 2015-10-10 DIAGNOSIS — Z87891 Personal history of nicotine dependence: Secondary | ICD-10-CM | POA: Insufficient documentation

## 2015-10-10 DIAGNOSIS — B181 Chronic viral hepatitis B without delta-agent: Secondary | ICD-10-CM | POA: Insufficient documentation

## 2015-10-10 DIAGNOSIS — D649 Anemia, unspecified: Secondary | ICD-10-CM | POA: Insufficient documentation

## 2015-10-10 DIAGNOSIS — I120 Hypertensive chronic kidney disease with stage 5 chronic kidney disease or end stage renal disease: Secondary | ICD-10-CM | POA: Insufficient documentation

## 2015-10-10 DIAGNOSIS — M1990 Unspecified osteoarthritis, unspecified site: Secondary | ICD-10-CM | POA: Diagnosis not present

## 2015-10-10 DIAGNOSIS — Z992 Dependence on renal dialysis: Secondary | ICD-10-CM | POA: Diagnosis not present

## 2015-10-10 DIAGNOSIS — Z21 Asymptomatic human immunodeficiency virus [HIV] infection status: Secondary | ICD-10-CM | POA: Insufficient documentation

## 2015-10-10 DIAGNOSIS — J45909 Unspecified asthma, uncomplicated: Secondary | ICD-10-CM | POA: Insufficient documentation

## 2015-10-10 DIAGNOSIS — N186 End stage renal disease: Secondary | ICD-10-CM | POA: Diagnosis not present

## 2015-10-10 DIAGNOSIS — Z792 Long term (current) use of antibiotics: Secondary | ICD-10-CM | POA: Diagnosis not present

## 2015-10-10 DIAGNOSIS — B182 Chronic viral hepatitis C: Secondary | ICD-10-CM | POA: Diagnosis not present

## 2015-10-10 DIAGNOSIS — T82838A Hemorrhage of vascular prosthetic devices, implants and grafts, initial encounter: Secondary | ICD-10-CM | POA: Diagnosis not present

## 2015-10-10 HISTORY — PX: REVISON OF ARTERIOVENOUS FISTULA: SHX6074

## 2015-10-10 HISTORY — DX: Pneumonia, unspecified organism: J18.9

## 2015-10-10 HISTORY — DX: Unspecified osteoarthritis, unspecified site: M19.90

## 2015-10-10 HISTORY — DX: Thyrotoxicosis, unspecified without thyrotoxic crisis or storm: E05.90

## 2015-10-10 LAB — POCT I-STAT 4, (NA,K, GLUC, HGB,HCT)
Glucose, Bld: 96 mg/dL (ref 65–99)
HCT: 31 % — ABNORMAL LOW (ref 39.0–52.0)
Hemoglobin: 10.5 g/dL — ABNORMAL LOW (ref 13.0–17.0)
Potassium: 4.7 mmol/L (ref 3.5–5.1)
Sodium: 136 mmol/L (ref 135–145)

## 2015-10-10 SURGERY — REVISON OF ARTERIOVENOUS FISTULA
Anesthesia: General | Site: Arm Lower | Laterality: Left

## 2015-10-10 MED ORDER — FENTANYL CITRATE (PF) 250 MCG/5ML IJ SOLN
INTRAMUSCULAR | Status: AC
  Filled 2015-10-10: qty 5

## 2015-10-10 MED ORDER — VANCOMYCIN HCL IN DEXTROSE 1-5 GM/200ML-% IV SOLN
1000.0000 mg | INTRAVENOUS | Status: AC
  Administered 2015-10-10: 1000 mg via INTRAVENOUS
  Filled 2015-10-10: qty 200

## 2015-10-10 MED ORDER — MIDAZOLAM HCL 5 MG/5ML IJ SOLN
INTRAMUSCULAR | Status: DC | PRN
  Administered 2015-10-10: 2 mg via INTRAVENOUS

## 2015-10-10 MED ORDER — PROMETHAZINE HCL 25 MG/ML IJ SOLN: 6.2500 mg | INTRAMUSCULAR | Status: DC | PRN

## 2015-10-10 MED ORDER — SODIUM CHLORIDE 0.9 % IV SOLN
INTRAVENOUS | Status: DC | PRN
  Administered 2015-10-10: 11:00:00

## 2015-10-10 MED ORDER — SODIUM CHLORIDE 0.9 % IR SOLN
Status: DC | PRN
  Administered 2015-10-10: 1000 mL

## 2015-10-10 MED ORDER — HYDROMORPHONE HCL 1 MG/ML IJ SOLN: 0.2500 mg | INTRAMUSCULAR | Status: DC | PRN

## 2015-10-10 MED ORDER — ONDANSETRON HCL 4 MG/2ML IJ SOLN
INTRAMUSCULAR | Status: AC
  Filled 2015-10-10: qty 2

## 2015-10-10 MED ORDER — LIDOCAINE-EPINEPHRINE (PF) 1 %-1:200000 IJ SOLN
INTRAMUSCULAR | Status: DC | PRN
  Administered 2015-10-10: 1 mL

## 2015-10-10 MED ORDER — ROCURONIUM BROMIDE 50 MG/5ML IV SOLN
INTRAVENOUS | Status: AC
  Filled 2015-10-10: qty 2

## 2015-10-10 MED ORDER — LIDOCAINE HCL (CARDIAC) 20 MG/ML IV SOLN
INTRAVENOUS | Status: AC
  Filled 2015-10-10: qty 5

## 2015-10-10 MED ORDER — HEPARIN SODIUM (PORCINE) 1000 UNIT/ML IJ SOLN
INTRAMUSCULAR | Status: DC | PRN
  Administered 2015-10-10: 8000 [IU] via INTRAVENOUS

## 2015-10-10 MED ORDER — PROTAMINE SULFATE 10 MG/ML IV SOLN
INTRAVENOUS | Status: DC | PRN
  Administered 2015-10-10: 50 mg via INTRAVENOUS

## 2015-10-10 MED ORDER — SUCCINYLCHOLINE CHLORIDE 20 MG/ML IJ SOLN
INTRAMUSCULAR | Status: AC
  Filled 2015-10-10: qty 1

## 2015-10-10 MED ORDER — LIDOCAINE-EPINEPHRINE (PF) 1 %-1:200000 IJ SOLN
INTRAMUSCULAR | Status: AC
  Filled 2015-10-10: qty 30

## 2015-10-10 MED ORDER — PROPOFOL 10 MG/ML IV BOLUS
INTRAVENOUS | Status: AC
  Filled 2015-10-10: qty 20

## 2015-10-10 MED ORDER — CHLORHEXIDINE GLUCONATE CLOTH 2 % EX PADS: 6.0000 | MEDICATED_PAD | Freq: Once | CUTANEOUS | Status: DC

## 2015-10-10 MED ORDER — SODIUM CHLORIDE 0.9 % IV SOLN
INTRAVENOUS | Status: DC | PRN
Start: 2015-10-10 — End: 2015-10-10
  Administered 2015-10-10: 11:00:00 via INTRAVENOUS

## 2015-10-10 MED ORDER — PROPOFOL 10 MG/ML IV BOLUS
INTRAVENOUS | Status: DC | PRN
  Administered 2015-10-10: 170 mg via INTRAVENOUS

## 2015-10-10 MED ORDER — LIDOCAINE HCL (CARDIAC) 20 MG/ML IV SOLN
INTRAVENOUS | Status: DC | PRN
  Administered 2015-10-10: 100 mg via INTRAVENOUS

## 2015-10-10 MED ORDER — SODIUM CHLORIDE 0.9 % IV SOLN
INTRAVENOUS | Status: DC
  Administered 2015-10-10 (×2): via INTRAVENOUS

## 2015-10-10 MED ORDER — FENTANYL CITRATE (PF) 100 MCG/2ML IJ SOLN
INTRAMUSCULAR | Status: DC | PRN
Start: 2015-10-10 — End: 2015-10-10
  Administered 2015-10-10 (×2): 50 ug via INTRAVENOUS

## 2015-10-10 MED ORDER — OXYCODONE-ACETAMINOPHEN 5-325 MG PO TABS: 1.0000 | ORAL_TABLET | Freq: Four times a day (QID) | ORAL | Status: DC | PRN

## 2015-10-10 MED ORDER — MIDAZOLAM HCL 2 MG/2ML IJ SOLN
INTRAMUSCULAR | Status: AC
  Filled 2015-10-10: qty 2

## 2015-10-10 MED ORDER — LIDOCAINE HCL (PF) 1 % IJ SOLN
INTRAMUSCULAR | Status: AC
  Filled 2015-10-10: qty 30

## 2015-10-10 SURGICAL SUPPLY — 34 items
CANISTER SUCTION 2500CC (MISCELLANEOUS) ×2 IMPLANT
CANNULA VESSEL 3MM 2 BLNT TIP (CANNULA) ×2 IMPLANT
CLIP TI MEDIUM 6 (CLIP) ×2 IMPLANT
CLIP TI WIDE RED SMALL 6 (CLIP) ×2 IMPLANT
COVER PROBE W GEL 5X96 (DRAPES) ×2 IMPLANT
ELECT REM PT RETURN 9FT ADLT (ELECTROSURGICAL) ×2
ELECTRODE REM PT RTRN 9FT ADLT (ELECTROSURGICAL) ×1 IMPLANT
GLOVE BIO SURGEON STRL SZ 6.5 (GLOVE) ×2 IMPLANT
GLOVE BIO SURGEON STRL SZ7.5 (GLOVE) ×2 IMPLANT
GLOVE BIOGEL PI IND STRL 6.5 (GLOVE) ×2 IMPLANT
GLOVE BIOGEL PI IND STRL 7.0 (GLOVE) ×2 IMPLANT
GLOVE BIOGEL PI IND STRL 8 (GLOVE) ×1 IMPLANT
GLOVE BIOGEL PI INDICATOR 6.5 (GLOVE) ×2
GLOVE BIOGEL PI INDICATOR 7.0 (GLOVE) ×2
GLOVE BIOGEL PI INDICATOR 8 (GLOVE) ×1
GLOVE ECLIPSE 6.5 STRL STRAW (GLOVE) ×2 IMPLANT
GLOVE SURG SS PI 6.5 STRL IVOR (GLOVE) ×2 IMPLANT
GOWN STRL REUS W/ TWL LRG LVL3 (GOWN DISPOSABLE) ×3 IMPLANT
GOWN STRL REUS W/ TWL XL LVL3 (GOWN DISPOSABLE) ×1 IMPLANT
GOWN STRL REUS W/TWL LRG LVL3 (GOWN DISPOSABLE) ×3
GOWN STRL REUS W/TWL XL LVL3 (GOWN DISPOSABLE) ×1
KIT BASIN OR (CUSTOM PROCEDURE TRAY) ×2 IMPLANT
KIT ROOM TURNOVER OR (KITS) ×2 IMPLANT
LIQUID BAND (GAUZE/BANDAGES/DRESSINGS) ×2 IMPLANT
NS IRRIG 1000ML POUR BTL (IV SOLUTION) ×2 IMPLANT
PACK CV ACCESS (CUSTOM PROCEDURE TRAY) ×2 IMPLANT
PAD ARMBOARD 7.5X6 YLW CONV (MISCELLANEOUS) ×4 IMPLANT
SPONGE SURGIFOAM ABS GEL 100 (HEMOSTASIS) IMPLANT
SUT PROLENE 6 0 BV (SUTURE) ×2 IMPLANT
SUT VIC AB 3-0 SH 27 (SUTURE) ×1
SUT VIC AB 3-0 SH 27X BRD (SUTURE) ×1 IMPLANT
SUT VICRYL 4-0 PS2 18IN ABS (SUTURE) ×2 IMPLANT
UNDERPAD 30X30 INCONTINENT (UNDERPADS AND DIAPERS) ×2 IMPLANT
WATER STERILE IRR 1000ML POUR (IV SOLUTION) ×2 IMPLANT

## 2015-10-10 NOTE — Interval H&P Note (Signed)
History and Physical Interval Note:  10/10/2015 10:35 AM  Jeffrey [name]  has presented today for surgery, with the diagnosis of End Stage Renal Disease N18.6  The various methods of treatment have been discussed with the patient and family. After consideration of risks, benefits and other options for treatment, the patient has consented to  Procedure(s): REVISON OF ARTERIOVENOUS FISTULA (Left) as a surgical intervention .  The patient's history has been reviewed, patient examined, no change in status, stable for surgery.  I have reviewed the patient's chart and labs.  Questions were answered to the patient's satisfaction.     [name]

## 2015-10-10 NOTE — Op Note (Signed)
    NAME: Jeffrey [name]    MRN: [id number] DOB: Jan 25, 1956    DATE OF OPERATION: 10/10/2015  PREOP DIAGNOSIS: Bleeding from right radiocephalic AV fistula  POSTOP DIAGNOSIS: Same  PROCEDURE: Revision of right radiocephalic AV fistula  SURGEON: Judeth Cornfield. Scot Dock, MD, FACS  ASSIST: Silva Bandy, [facility]  ANESTHESIA: Gen.   EBL: minimal  INDICATIONS: Jeffrey [name] is a 60 y.o. male who has a right radiocephalic AV fistula. He does dialysis at home. He has had multiple bleeding episodes from one of the cannulation sites. He was set up for revision of his fistula because of these recurrent bleeding episodes.  FINDINGS: the eschar was excised and the defect in the artery was closed with an interrupted 6-0 Prolene suture.  TECHNIQUE: The patient was taken to the operating room and received a general anesthetic. The right upper extremity was prepped and draped in usual sterile fashion. An elliptical incision was made encompassing the eschar. This was done transversely. The patient was heparinized. A fistula was compressed proximally and distally and then the eschar excised. The hole in the vein was identified and was repaired with one 60 horizontal mattress suture. There was good hemostasis. Hemostasis was obtained in the wound. The heparin was partially reversed with protamine. The wound was closed with a running 4-0 Vicryl suture. Liquiband was applied. The patient tolerated the procedure well and transferred to the recovery room in stable condition. All needle and sponge counts were correct.  [name], MD, FACS [facility]  DATE OF DICTATION:   10/10/2015

## 2015-10-10 NOTE — Anesthesia Postprocedure Evaluation (Signed)
Anesthesia Post Note  Patient: Jeffrey [name]  Procedure(s) Performed: Procedure(s) (LRB): REVISON OF LEFT RADIOCEPHALIC ARTERIOVENOUS FISTULA (Left)  Patient location during evaluation: PACU Anesthesia Type: General Level of consciousness: awake and alert Pain management: pain level controlled Vital Signs Assessment: post-procedure vital signs reviewed and stable Respiratory status: spontaneous breathing, nonlabored ventilation, respiratory function stable and patient connected to nasal cannula oxygen Cardiovascular status: blood pressure returned to baseline and stable Postop Assessment: no signs of nausea or vomiting Anesthetic complications: no    Last Vitals:  Filed Vitals:   10/10/15 1200 10/10/15 1215  BP: 153/101 144/95  Pulse: 78 73  Temp:  36.7 C  Resp: 17 17    Last Pain:  Filed Vitals:   10/10/15 1219  PainSc: 0-No pain                 Kaydenn Mclear,[name] TERRILL

## 2015-10-10 NOTE — H&P (View-Only) (Signed)
Subjective:     Patient ID: Jeffrey [name], male   DOB: Jul 01, 1956, 60 y.o.   MRN: [id number]  HPI This 60 year old male with end-stage renal disease has hemodialysis at home various days of the week depending on his schedule. Patient has had to episodes of spontaneous bleeding from an old buttonhole site near the arterial anastomosis of the left radial-cephalic AV fistula. One occurred on Friday January 13 and 1 occurred earlier today. Fistula continues to function nicely in this area is not utilized as a puncture site.   Past Medical History  Diagnosis Date  . Hypertension   . Gout   . Anemia   . Seizures ([place]) 06/02/2011  . Thrombocytopenia ([place])   . Anxiety   . Hepatitis B carrier   . HIV infection ([place])   . Seizure ([place])   . Asthma     per pt hx  . End stage renal disease on home HD 07/10/2011    Started HD in September 2012 at [facility] with a tunneled HD catheter, now on home HD with NxtStage. Dialyzing through AVF L lower arm with buttonhole technique as of mid 2014. His brother does the HD treatments at home.  They are roommates for 23 years.  The brother works 3rd shift and gets off about 8am and then puts Jeffrey [name] on HD in the morning after getting home. Most of the time he does HD about 4 times a week, for about 4 hours per treatment. Cause of ESRD was HTN according to patient. He says he let his health go and ending up with complications, and that he didn't like seeing doctors in those days.  He says he was diagnosed with severe HTN when he lived in [place] in his 60's.     Social History  Substance Use Topics  . Smoking status: Former Smoker    Types: Cigarettes    Quit date: 08/08/2015  . Smokeless tobacco: Never Used     Comment: not ready to quit yet; 1 pack lasts 2 months. "very occassional"  . Alcohol Use: No    Family History  Problem Relation Age of Onset  . Hypertension Mother   . Cancer Father     Allergies  Allergen Reactions  . Lisinopril Shortness Of Breath  and Swelling    Throat swelling  . Penicillins Other (See Comments)    Childhood allergy     Current outpatient prescriptions:  .  albuterol (PROVENTIL) (2.5 MG/3ML) 0.083% nebulizer solution, Take 2.5 mg by nebulization every 6 (six) hours as needed for wheezing or shortness of breath., Disp: , Rfl:  .  amLODipine (NORVASC) 5 MG tablet, TK 1 T PO QD . MAY TAKE A SECOND TABLET BASED ON BP AFTER DIALYSIS, Disp: , Rfl: 10 .  calcitRIOL (ROCALTROL) 0.25 MCG capsule, Take 0.25 mcg by mouth daily., Disp: , Rfl:  .  calcium acetate (PHOSLO) 667 MG capsule, 3 caps with a meal, 2 caps with a snack, Disp: , Rfl:  .  carvedilol (COREG) 12.5 MG tablet, TK 25 mg  T PO qd  FOR HIGH BLOOD PRESSURE, Disp: , Rfl: 12 .  cephALEXin (KEFLEX) 250 MG capsule, 2 (two) times daily., Disp: , Rfl:  .  cloNIDine (CATAPRES) 0.1 MG tablet, 1 tablet 2 (two) times daily., Disp: , Rfl: 6 .  EPIVIR HBV 5 MG/ML solution, TAKE 5 ML BY MOUTH ONCE DAILY, Disp: 240 mL, Rfl: 3 .  ethyl chloride spray, Apply 1 application topically daily as needed.  At HD, Disp: , Rfl: 12 .  lamivudine (EPIVIR HBV) 5 MG/ML solution, TAKE 5 MLS BY MOUTH ONCE DAILY, Disp: 240 mL, Rfl: 5 .  multivitamin (RENA-VIT) TABS tablet, Take 1 tablet by mouth daily.  , Disp: , Rfl:  .  raltegravir (ISENTRESS) 400 MG tablet, Take 1 tablet (400 mg total) by mouth 2 (two) times daily., Disp: 60 tablet, Rfl: 5 .  tenofovir (VIREAD) 300 MG tablet, Take 1 tablet (300 mg total) by mouth every Saturday. After dialysis on Saturday, Disp: 5 tablet, Rfl: 5 .  traZODone (DESYREL) 50 MG tablet, Take 50 mg by mouth at bedtime as needed for sleep., Disp: , Rfl:   Filed Vitals:   10/08/15 1108 10/08/15 1117  BP: 154/94 158/98  Pulse: 92 88  Temp: 98.7 F (37.1 C)   TempSrc: Oral   Resp: 16   Weight: 214 lb (97.07 kg)   SpO2: 98%     Body mass index is 29.02 kg/(m^2).           Review of Systems Patient has history of hepatitis C, HIV positive ,  hypertension, gout. Denies chest pain, dyspnea on exertion, PND, orthopnea, hemoptysis, claudication. His fistula was created in 2012. On home hemodialysis. All systems otherwise negative and a complete review of systems     Objective:   Physical Exam BP 158/98 mmHg  Pulse 88  Temp(Src) 98.7 F (37.1 C) (Oral)  Resp 16  Wt 214 lb (97.07 kg)  SpO2 98%  Gen.-alert and oriented x3 in no apparent distress HEENT normal for age Lungs no rhonchi or wheezing Cardiovascular regular rhythm no murmurs carotid pulses 3+ palpable no bruits audible Abdomen soft nontender no palpable masses Musculoskeletal free of  major deformities Skin clear -no rashes Neurologic normal Lower extremities 3+ femoral and dorsalis pedis pulses palpable bilaterally with no edema  left upper extremity with excellent pulse and palpable thrill and radial-cephalic AV fistula. Eschar overlying old buttonhole site about 6 cm from arterial anastomosis. Mild aneurysmal dilatation. Other puncture sites are clean more proximally in the arm. Left and well perfused.      Assessment:      end-stage renal disease on home hemodialysis  2 episodes of spontaneous bleeding from  Old buttonhole puncture site   HIV positive  hepatitis C carrier  hypertension     Plan:      plan revision left radial-cephalic AV fistula by either Dr. Bridgett Larsson or Doren Custard on Thursday of this week to hopefully prevent any other episodes of spontaneous bleeding Discuss this with patient and he would like to proceed

## 2015-10-10 NOTE — Anesthesia Procedure Notes (Signed)
Procedure Name: LMA Insertion Date/Time: 10/10/2015 11:03 AM Performed by: Scheryl Darter Pre-anesthesia Checklist: Patient identified, Emergency Drugs available, Suction available, Patient being monitored and Timeout performed Patient Re-evaluated:Patient Re-evaluated prior to inductionOxygen Delivery Method: Circle system utilized Preoxygenation: Pre-oxygenation with 100% oxygen Intubation Type: IV induction Ventilation: Mask ventilation without difficulty LMA: LMA inserted LMA Size: 5.0 Number of attempts: 1 Placement Confirmation: positive ETCO2 and breath sounds checked- equal and bilateral Tube secured with: Tape Dental Injury: Teeth and Oropharynx as per pre-operative assessment

## 2015-10-10 NOTE — Anesthesia Preprocedure Evaluation (Signed)
Anesthesia Evaluation  Patient identified by MRN, date of birth, ID band Patient awake    History of Anesthesia Complications Negative for: history of anesthetic complications  Airway Mallampati: II       Dental   Pulmonary asthma , former smoker,    breath sounds clear to auscultation       Cardiovascular hypertension,  Rhythm:Regular Rate:Normal     Neuro/Psych Seizures -,     GI/Hepatic (+) Hepatitis -  Endo/Other  Hyperthyroidism   Renal/GU ESRFRenal disease     Musculoskeletal   Abdominal   Peds  Hematology  (+) anemia , HIV   Anesthesia Other Findings   Reproductive/Obstetrics                             Anesthesia Physical Anesthesia Plan  ASA: III  Anesthesia Plan: General   Post-op Pain Management:    Induction: Intravenous  Airway Management Planned: LMA  Additional Equipment:   Intra-op Plan:   Post-operative Plan: Extubation in OR  Informed Consent: I have reviewed the patients History and Physical, chart, labs and discussed the procedure including the risks, benefits and alternatives for the proposed anesthesia with the patient or authorized representative who has indicated his/her understanding and acceptance.   Dental advisory given  Plan Discussed with:   Anesthesia Plan Comments:         Anesthesia Quick Evaluation

## 2015-10-10 NOTE — Transfer of Care (Signed)
Immediate Anesthesia Transfer of Care Note  Patient: Jeffrey [name]  Procedure(s) Performed: Procedure(s): REVISON OF LEFT RADIOCEPHALIC ARTERIOVENOUS FISTULA (Left)  Patient Location: PACU  Anesthesia Type:General  Level of Consciousness: awake, alert , oriented and sedated  Airway & Oxygen Therapy: Patient Spontanous Breathing and Patient connected to nasal cannula oxygen  Post-op Assessment: Report given to RN, Post -op Vital signs reviewed and stable and Patient moving all extremities  Post vital signs: Reviewed and stable  Last Vitals:  Filed Vitals:   10/10/15 0837 10/10/15 1158  BP: 149/94 153/101  Pulse: 119 90  Temp: 37.1 C 36.8 C  Resp: 16 19    Complications: No apparent anesthesia complications

## 2015-10-11 ENCOUNTER — Encounter (HOSPITAL_COMMUNITY): Payer: Self-pay | Admitting: Vascular Surgery

## 2015-10-12 DIAGNOSIS — D509 Iron deficiency anemia, unspecified: Secondary | ICD-10-CM | POA: Diagnosis not present

## 2015-10-12 DIAGNOSIS — N2581 Secondary hyperparathyroidism of renal origin: Secondary | ICD-10-CM | POA: Diagnosis not present

## 2015-10-12 DIAGNOSIS — N186 End stage renal disease: Secondary | ICD-10-CM | POA: Diagnosis not present

## 2015-10-12 DIAGNOSIS — D631 Anemia in chronic kidney disease: Secondary | ICD-10-CM | POA: Diagnosis not present

## 2015-10-14 ENCOUNTER — Other Ambulatory Visit: Payer: Medicare Other

## 2015-10-14 DIAGNOSIS — Z113 Encounter for screening for infections with a predominantly sexual mode of transmission: Secondary | ICD-10-CM

## 2015-10-14 DIAGNOSIS — Z79899 Other long term (current) drug therapy: Secondary | ICD-10-CM

## 2015-10-14 DIAGNOSIS — B20 Human immunodeficiency virus [HIV] disease: Secondary | ICD-10-CM | POA: Diagnosis not present

## 2015-10-14 LAB — CBC
HCT: 28.7 % — ABNORMAL LOW (ref 39.0–52.0)
Hemoglobin: 9.6 g/dL — ABNORMAL LOW (ref 13.0–17.0)
MCH: 30.4 pg (ref 26.0–34.0)
MCHC: 33.4 g/dL (ref 30.0–36.0)
MCV: 90.8 fL (ref 78.0–100.0)
MPV: 8 fL — ABNORMAL LOW (ref 8.6–12.4)
PLATELETS: 165 10*3/uL (ref 150–400)
RBC: 3.16 MIL/uL — ABNORMAL LOW (ref 4.22–5.81)
RDW: 16 % — ABNORMAL HIGH (ref 11.5–15.5)
WBC: 8.2 10*3/uL (ref 4.0–10.5)

## 2015-10-14 LAB — COMPREHENSIVE METABOLIC PANEL
ALK PHOS: 51 U/L (ref 40–115)
ALT: 15 U/L (ref 9–46)
AST: 13 U/L (ref 10–35)
Albumin: 3.9 g/dL (ref 3.6–5.1)
BILIRUBIN TOTAL: 0.4 mg/dL (ref 0.2–1.2)
BUN: 80 mg/dL — AB (ref 7–25)
CALCIUM: 9.2 mg/dL (ref 8.6–10.3)
CO2: 23 mmol/L (ref 20–31)
Chloride: 94 mmol/L — ABNORMAL LOW (ref 98–110)
Creat: 16.52 mg/dL — ABNORMAL HIGH (ref 0.70–1.25)
GLUCOSE: 83 mg/dL (ref 65–99)
Potassium: 5.3 mmol/L (ref 3.5–5.3)
Sodium: 135 mmol/L (ref 135–146)
Total Protein: 7.6 g/dL (ref 6.1–8.1)

## 2015-10-14 LAB — LIPID PANEL
CHOLESTEROL: 85 mg/dL — AB (ref 125–200)
HDL: 19 mg/dL — ABNORMAL LOW (ref >=40)
LDL Cholesterol: 45 mg/dL (ref <130)
Total CHOL/HDL Ratio: 4.5 Ratio (ref <=5.0)
Triglycerides: 106 mg/dL (ref <150)
VLDL: 21 mg/dL (ref <30)

## 2015-10-15 ENCOUNTER — Telehealth: Payer: Self-pay | Admitting: *Deleted

## 2015-10-15 LAB — HIV-1 RNA QUANT-NO REFLEX-BLD
HIV 1 RNA QUANT: 28 {copies}/mL — AB (ref <20)
HIV-1 RNA Quant, Log: 1.45 Log copies/mL — ABNORMAL HIGH (ref <1.30)

## 2015-10-15 LAB — RPR

## 2015-10-15 NOTE — Telephone Encounter (Signed)
Alert high creatinine = 16.52 drawn 1/23. Patient on home hemodialysis.  Landis Gandy, RN

## 2015-10-16 DIAGNOSIS — D509 Iron deficiency anemia, unspecified: Secondary | ICD-10-CM | POA: Diagnosis not present

## 2015-10-16 DIAGNOSIS — N2581 Secondary hyperparathyroidism of renal origin: Secondary | ICD-10-CM | POA: Diagnosis not present

## 2015-10-16 DIAGNOSIS — N186 End stage renal disease: Secondary | ICD-10-CM | POA: Diagnosis not present

## 2015-10-16 DIAGNOSIS — D631 Anemia in chronic kidney disease: Secondary | ICD-10-CM | POA: Diagnosis not present

## 2015-10-16 LAB — T-HELPER CELL (CD4) - (RCID CLINIC ONLY)
CD4 % Helper T Cell: 27 % — ABNORMAL LOW (ref 33–55)
CD4 T Cell Abs: 520 /uL (ref 400–2700)

## 2015-10-18 DIAGNOSIS — D631 Anemia in chronic kidney disease: Secondary | ICD-10-CM | POA: Diagnosis not present

## 2015-10-18 DIAGNOSIS — N186 End stage renal disease: Secondary | ICD-10-CM | POA: Diagnosis not present

## 2015-10-18 DIAGNOSIS — N2581 Secondary hyperparathyroidism of renal origin: Secondary | ICD-10-CM | POA: Diagnosis not present

## 2015-10-18 DIAGNOSIS — D509 Iron deficiency anemia, unspecified: Secondary | ICD-10-CM | POA: Diagnosis not present

## 2015-10-21 DIAGNOSIS — N2581 Secondary hyperparathyroidism of renal origin: Secondary | ICD-10-CM | POA: Diagnosis not present

## 2015-10-21 DIAGNOSIS — N186 End stage renal disease: Secondary | ICD-10-CM | POA: Diagnosis not present

## 2015-10-21 DIAGNOSIS — D509 Iron deficiency anemia, unspecified: Secondary | ICD-10-CM | POA: Diagnosis not present

## 2015-10-21 DIAGNOSIS — D631 Anemia in chronic kidney disease: Secondary | ICD-10-CM | POA: Diagnosis not present

## 2015-10-22 DIAGNOSIS — N186 End stage renal disease: Secondary | ICD-10-CM | POA: Diagnosis not present

## 2015-10-22 DIAGNOSIS — Z992 Dependence on renal dialysis: Secondary | ICD-10-CM | POA: Diagnosis not present

## 2015-10-22 DIAGNOSIS — I120 Hypertensive chronic kidney disease with stage 5 chronic kidney disease or end stage renal disease: Secondary | ICD-10-CM | POA: Diagnosis not present

## 2015-10-23 MED FILL — ETHYL CHLORIDE SPRAY: 30 days supply | Qty: 104 | Fill #2

## 2015-10-24 DIAGNOSIS — N186 End stage renal disease: Secondary | ICD-10-CM | POA: Diagnosis not present

## 2015-10-24 DIAGNOSIS — Z79899 Other long term (current) drug therapy: Secondary | ICD-10-CM | POA: Diagnosis not present

## 2015-10-24 DIAGNOSIS — D631 Anemia in chronic kidney disease: Secondary | ICD-10-CM | POA: Diagnosis not present

## 2015-10-24 DIAGNOSIS — D509 Iron deficiency anemia, unspecified: Secondary | ICD-10-CM | POA: Diagnosis not present

## 2015-10-26 DIAGNOSIS — D509 Iron deficiency anemia, unspecified: Secondary | ICD-10-CM | POA: Diagnosis not present

## 2015-10-26 DIAGNOSIS — Z79899 Other long term (current) drug therapy: Secondary | ICD-10-CM | POA: Diagnosis not present

## 2015-10-26 DIAGNOSIS — D631 Anemia in chronic kidney disease: Secondary | ICD-10-CM | POA: Diagnosis not present

## 2015-10-26 DIAGNOSIS — N186 End stage renal disease: Secondary | ICD-10-CM | POA: Diagnosis not present

## 2015-10-28 ENCOUNTER — Ambulatory Visit (INDEPENDENT_AMBULATORY_CARE_PROVIDER_SITE_OTHER): Payer: Medicare Other | Admitting: Infectious Diseases

## 2015-10-28 ENCOUNTER — Encounter: Payer: Self-pay | Admitting: Infectious Diseases

## 2015-10-28 VITALS — BP 192/93 | HR 94 | Temp 98.8°F | Wt 218.0 lb

## 2015-10-28 DIAGNOSIS — Z79899 Other long term (current) drug therapy: Secondary | ICD-10-CM

## 2015-10-28 DIAGNOSIS — I10 Essential (primary) hypertension: Secondary | ICD-10-CM

## 2015-10-28 DIAGNOSIS — Z113 Encounter for screening for infections with a predominantly sexual mode of transmission: Secondary | ICD-10-CM

## 2015-10-28 DIAGNOSIS — Z720 Tobacco use: Secondary | ICD-10-CM | POA: Diagnosis not present

## 2015-10-28 DIAGNOSIS — N186 End stage renal disease: Secondary | ICD-10-CM

## 2015-10-28 DIAGNOSIS — B20 Human immunodeficiency virus [HIV] disease: Secondary | ICD-10-CM

## 2015-10-28 DIAGNOSIS — B1911 Unspecified viral hepatitis B with hepatic coma: Secondary | ICD-10-CM

## 2015-10-28 DIAGNOSIS — D631 Anemia in chronic kidney disease: Secondary | ICD-10-CM | POA: Diagnosis not present

## 2015-10-28 DIAGNOSIS — D509 Iron deficiency anemia, unspecified: Secondary | ICD-10-CM | POA: Diagnosis not present

## 2015-10-28 DIAGNOSIS — B181 Chronic viral hepatitis B without delta-agent: Secondary | ICD-10-CM

## 2015-10-28 NOTE — Assessment & Plan Note (Signed)
He will take his medications and f/u with renal.

## 2015-10-28 NOTE — Progress Notes (Signed)
   Subjective:    Patient ID: Jeffrey [name], male    DOB: 25-Mar-1956, 60 y.o.   MRN: [id number]  HPI 60 yo M with HIV+, Hepatitis B, ESRD, HTN. On 3TC/ISN/TFV. Had fistula revision on 10-10-15.   Has been healing well. No problems with HD at home.  Did not take HTN rx this AM. Saw renal ([facility]) last week. Had rx increased. States his DBP nl is ~98.   HIV 1 RNA QUANT (copies/mL)  Date Value  10/14/2015 28*  04/30/2015 <20  10/23/2014 <20   CD4 T CELL ABS (/uL)  Date Value  10/14/2015 520  04/30/2015 490  10/23/2014 470    Review of Systems  Constitutional: Negative for appetite change and unexpected weight change.  Respiratory: Negative for shortness of breath.   Cardiovascular: Negative for chest pain.  Gastrointestinal: Negative for diarrhea and constipation.  Genitourinary: Negative for difficulty urinating.  Neurological: Negative for dizziness and headaches.      Objective:   Physical Exam  Constitutional: He appears well-developed and well-nourished.  HENT:  Mouth/Throat: No oropharyngeal exudate.  Eyes: EOM are normal. Pupils are equal, round, and reactive to light.  Neck: Neck supple.  Cardiovascular: Normal rate, regular rhythm and normal heart sounds.   Pulmonary/Chest: Effort normal and breath sounds normal.  Abdominal: Soft. Bowel sounds are normal. There is no tenderness. There is no rebound.  Musculoskeletal: He exhibits no edema.       Arms: Lymphadenopathy:    He has no cervical adenopathy.      Assessment & Plan:

## 2015-10-28 NOTE — Assessment & Plan Note (Addendum)
+  DNA 2012.  Last DNA was (-).  Needs Hep A vax Will continue his current tx

## 2015-10-28 NOTE — Assessment & Plan Note (Signed)
Doing well, blip.  No change in ART Has gotten flu shot Refuses colon Will get PNVx update in Sept (per pt) rtc in 6 months

## 2015-10-28 NOTE — Assessment & Plan Note (Signed)
Will f/u with renal whom we greatly appreciate partnering with

## 2015-10-28 NOTE — Assessment & Plan Note (Signed)
resolved 

## 2015-10-30 DIAGNOSIS — D631 Anemia in chronic kidney disease: Secondary | ICD-10-CM | POA: Diagnosis not present

## 2015-10-30 DIAGNOSIS — Z79899 Other long term (current) drug therapy: Secondary | ICD-10-CM | POA: Diagnosis not present

## 2015-10-30 DIAGNOSIS — N186 End stage renal disease: Secondary | ICD-10-CM | POA: Diagnosis not present

## 2015-10-30 DIAGNOSIS — D509 Iron deficiency anemia, unspecified: Secondary | ICD-10-CM | POA: Diagnosis not present

## 2015-11-01 DIAGNOSIS — Z79899 Other long term (current) drug therapy: Secondary | ICD-10-CM | POA: Diagnosis not present

## 2015-11-01 DIAGNOSIS — D631 Anemia in chronic kidney disease: Secondary | ICD-10-CM | POA: Diagnosis not present

## 2015-11-01 DIAGNOSIS — D509 Iron deficiency anemia, unspecified: Secondary | ICD-10-CM | POA: Diagnosis not present

## 2015-11-01 DIAGNOSIS — N186 End stage renal disease: Secondary | ICD-10-CM | POA: Diagnosis not present

## 2015-11-04 DIAGNOSIS — D631 Anemia in chronic kidney disease: Secondary | ICD-10-CM | POA: Diagnosis not present

## 2015-11-04 DIAGNOSIS — N186 End stage renal disease: Secondary | ICD-10-CM | POA: Diagnosis not present

## 2015-11-04 DIAGNOSIS — Z79899 Other long term (current) drug therapy: Secondary | ICD-10-CM | POA: Diagnosis not present

## 2015-11-04 DIAGNOSIS — D509 Iron deficiency anemia, unspecified: Secondary | ICD-10-CM | POA: Diagnosis not present

## 2015-11-05 DIAGNOSIS — D509 Iron deficiency anemia, unspecified: Secondary | ICD-10-CM | POA: Diagnosis not present

## 2015-11-05 DIAGNOSIS — N186 End stage renal disease: Secondary | ICD-10-CM | POA: Diagnosis not present

## 2015-11-05 DIAGNOSIS — D631 Anemia in chronic kidney disease: Secondary | ICD-10-CM | POA: Diagnosis not present

## 2015-11-05 DIAGNOSIS — Z79899 Other long term (current) drug therapy: Secondary | ICD-10-CM | POA: Diagnosis not present

## 2015-11-05 DIAGNOSIS — M109 Gout, unspecified: Secondary | ICD-10-CM | POA: Diagnosis not present

## 2015-11-06 DIAGNOSIS — D631 Anemia in chronic kidney disease: Secondary | ICD-10-CM | POA: Diagnosis not present

## 2015-11-06 DIAGNOSIS — Z79899 Other long term (current) drug therapy: Secondary | ICD-10-CM | POA: Diagnosis not present

## 2015-11-06 DIAGNOSIS — D509 Iron deficiency anemia, unspecified: Secondary | ICD-10-CM | POA: Diagnosis not present

## 2015-11-06 DIAGNOSIS — N186 End stage renal disease: Secondary | ICD-10-CM | POA: Diagnosis not present

## 2015-11-08 DIAGNOSIS — D631 Anemia in chronic kidney disease: Secondary | ICD-10-CM | POA: Diagnosis not present

## 2015-11-08 DIAGNOSIS — D509 Iron deficiency anemia, unspecified: Secondary | ICD-10-CM | POA: Diagnosis not present

## 2015-11-08 DIAGNOSIS — N186 End stage renal disease: Secondary | ICD-10-CM | POA: Diagnosis not present

## 2015-11-08 DIAGNOSIS — Z79899 Other long term (current) drug therapy: Secondary | ICD-10-CM | POA: Diagnosis not present

## 2015-11-11 DIAGNOSIS — Z79899 Other long term (current) drug therapy: Secondary | ICD-10-CM | POA: Diagnosis not present

## 2015-11-11 DIAGNOSIS — D631 Anemia in chronic kidney disease: Secondary | ICD-10-CM | POA: Diagnosis not present

## 2015-11-11 DIAGNOSIS — N186 End stage renal disease: Secondary | ICD-10-CM | POA: Diagnosis not present

## 2015-11-11 DIAGNOSIS — D509 Iron deficiency anemia, unspecified: Secondary | ICD-10-CM | POA: Diagnosis not present

## 2015-11-13 DIAGNOSIS — D631 Anemia in chronic kidney disease: Secondary | ICD-10-CM | POA: Diagnosis not present

## 2015-11-13 DIAGNOSIS — D509 Iron deficiency anemia, unspecified: Secondary | ICD-10-CM | POA: Diagnosis not present

## 2015-11-13 DIAGNOSIS — Z79899 Other long term (current) drug therapy: Secondary | ICD-10-CM | POA: Diagnosis not present

## 2015-11-13 DIAGNOSIS — N186 End stage renal disease: Secondary | ICD-10-CM | POA: Diagnosis not present

## 2015-11-19 DIAGNOSIS — I120 Hypertensive chronic kidney disease with stage 5 chronic kidney disease or end stage renal disease: Secondary | ICD-10-CM | POA: Diagnosis not present

## 2015-11-19 DIAGNOSIS — N186 End stage renal disease: Secondary | ICD-10-CM | POA: Diagnosis not present

## 2015-11-19 DIAGNOSIS — D509 Iron deficiency anemia, unspecified: Secondary | ICD-10-CM | POA: Diagnosis not present

## 2015-11-19 DIAGNOSIS — Z992 Dependence on renal dialysis: Secondary | ICD-10-CM | POA: Diagnosis not present

## 2015-11-19 DIAGNOSIS — D631 Anemia in chronic kidney disease: Secondary | ICD-10-CM | POA: Diagnosis not present

## 2015-11-19 DIAGNOSIS — Z79899 Other long term (current) drug therapy: Secondary | ICD-10-CM | POA: Diagnosis not present

## 2015-11-21 DIAGNOSIS — Z79899 Other long term (current) drug therapy: Secondary | ICD-10-CM | POA: Diagnosis not present

## 2015-11-21 DIAGNOSIS — D509 Iron deficiency anemia, unspecified: Secondary | ICD-10-CM | POA: Diagnosis not present

## 2015-11-21 DIAGNOSIS — N186 End stage renal disease: Secondary | ICD-10-CM | POA: Diagnosis not present

## 2015-11-21 DIAGNOSIS — D631 Anemia in chronic kidney disease: Secondary | ICD-10-CM | POA: Diagnosis not present

## 2015-11-23 DIAGNOSIS — N186 End stage renal disease: Secondary | ICD-10-CM | POA: Diagnosis not present

## 2015-11-23 DIAGNOSIS — Z79899 Other long term (current) drug therapy: Secondary | ICD-10-CM | POA: Diagnosis not present

## 2015-11-23 DIAGNOSIS — D509 Iron deficiency anemia, unspecified: Secondary | ICD-10-CM | POA: Diagnosis not present

## 2015-11-23 DIAGNOSIS — D631 Anemia in chronic kidney disease: Secondary | ICD-10-CM | POA: Diagnosis not present

## 2015-11-24 DIAGNOSIS — D509 Iron deficiency anemia, unspecified: Secondary | ICD-10-CM | POA: Diagnosis not present

## 2015-11-24 DIAGNOSIS — N186 End stage renal disease: Secondary | ICD-10-CM | POA: Diagnosis not present

## 2015-11-24 DIAGNOSIS — D631 Anemia in chronic kidney disease: Secondary | ICD-10-CM | POA: Diagnosis not present

## 2015-11-24 DIAGNOSIS — Z79899 Other long term (current) drug therapy: Secondary | ICD-10-CM | POA: Diagnosis not present

## 2015-11-26 DIAGNOSIS — M109 Gout, unspecified: Secondary | ICD-10-CM | POA: Diagnosis not present

## 2015-11-26 DIAGNOSIS — D631 Anemia in chronic kidney disease: Secondary | ICD-10-CM | POA: Diagnosis not present

## 2015-11-26 DIAGNOSIS — D509 Iron deficiency anemia, unspecified: Secondary | ICD-10-CM | POA: Diagnosis not present

## 2015-11-26 DIAGNOSIS — N186 End stage renal disease: Secondary | ICD-10-CM | POA: Diagnosis not present

## 2015-11-26 DIAGNOSIS — Z79899 Other long term (current) drug therapy: Secondary | ICD-10-CM | POA: Diagnosis not present

## 2015-11-28 DIAGNOSIS — N186 End stage renal disease: Secondary | ICD-10-CM | POA: Diagnosis not present

## 2015-11-28 DIAGNOSIS — Z79899 Other long term (current) drug therapy: Secondary | ICD-10-CM | POA: Diagnosis not present

## 2015-11-28 DIAGNOSIS — D631 Anemia in chronic kidney disease: Secondary | ICD-10-CM | POA: Diagnosis not present

## 2015-11-28 DIAGNOSIS — D509 Iron deficiency anemia, unspecified: Secondary | ICD-10-CM | POA: Diagnosis not present

## 2015-11-30 DIAGNOSIS — Z79899 Other long term (current) drug therapy: Secondary | ICD-10-CM | POA: Diagnosis not present

## 2015-11-30 DIAGNOSIS — D631 Anemia in chronic kidney disease: Secondary | ICD-10-CM | POA: Diagnosis not present

## 2015-11-30 DIAGNOSIS — D509 Iron deficiency anemia, unspecified: Secondary | ICD-10-CM | POA: Diagnosis not present

## 2015-11-30 DIAGNOSIS — N186 End stage renal disease: Secondary | ICD-10-CM | POA: Diagnosis not present

## 2015-12-01 DIAGNOSIS — D631 Anemia in chronic kidney disease: Secondary | ICD-10-CM | POA: Diagnosis not present

## 2015-12-01 DIAGNOSIS — Z79899 Other long term (current) drug therapy: Secondary | ICD-10-CM | POA: Diagnosis not present

## 2015-12-01 DIAGNOSIS — D509 Iron deficiency anemia, unspecified: Secondary | ICD-10-CM | POA: Diagnosis not present

## 2015-12-01 DIAGNOSIS — N186 End stage renal disease: Secondary | ICD-10-CM | POA: Diagnosis not present

## 2015-12-03 DIAGNOSIS — D631 Anemia in chronic kidney disease: Secondary | ICD-10-CM | POA: Diagnosis not present

## 2015-12-03 DIAGNOSIS — N186 End stage renal disease: Secondary | ICD-10-CM | POA: Diagnosis not present

## 2015-12-03 DIAGNOSIS — Z79899 Other long term (current) drug therapy: Secondary | ICD-10-CM | POA: Diagnosis not present

## 2015-12-03 DIAGNOSIS — D509 Iron deficiency anemia, unspecified: Secondary | ICD-10-CM | POA: Diagnosis not present

## 2015-12-05 DIAGNOSIS — D631 Anemia in chronic kidney disease: Secondary | ICD-10-CM | POA: Diagnosis not present

## 2015-12-05 DIAGNOSIS — N186 End stage renal disease: Secondary | ICD-10-CM | POA: Diagnosis not present

## 2015-12-05 DIAGNOSIS — Z79899 Other long term (current) drug therapy: Secondary | ICD-10-CM | POA: Diagnosis not present

## 2015-12-05 DIAGNOSIS — D509 Iron deficiency anemia, unspecified: Secondary | ICD-10-CM | POA: Diagnosis not present

## 2015-12-07 DIAGNOSIS — N186 End stage renal disease: Secondary | ICD-10-CM | POA: Diagnosis not present

## 2015-12-07 DIAGNOSIS — D631 Anemia in chronic kidney disease: Secondary | ICD-10-CM | POA: Diagnosis not present

## 2015-12-07 DIAGNOSIS — Z79899 Other long term (current) drug therapy: Secondary | ICD-10-CM | POA: Diagnosis not present

## 2015-12-07 DIAGNOSIS — D509 Iron deficiency anemia, unspecified: Secondary | ICD-10-CM | POA: Diagnosis not present

## 2015-12-08 DIAGNOSIS — Z79899 Other long term (current) drug therapy: Secondary | ICD-10-CM | POA: Diagnosis not present

## 2015-12-08 DIAGNOSIS — D631 Anemia in chronic kidney disease: Secondary | ICD-10-CM | POA: Diagnosis not present

## 2015-12-08 DIAGNOSIS — N186 End stage renal disease: Secondary | ICD-10-CM | POA: Diagnosis not present

## 2015-12-08 DIAGNOSIS — D509 Iron deficiency anemia, unspecified: Secondary | ICD-10-CM | POA: Diagnosis not present

## 2015-12-10 DIAGNOSIS — N186 End stage renal disease: Secondary | ICD-10-CM | POA: Diagnosis not present

## 2015-12-10 DIAGNOSIS — D509 Iron deficiency anemia, unspecified: Secondary | ICD-10-CM | POA: Diagnosis not present

## 2015-12-10 DIAGNOSIS — D631 Anemia in chronic kidney disease: Secondary | ICD-10-CM | POA: Diagnosis not present

## 2015-12-10 DIAGNOSIS — Z79899 Other long term (current) drug therapy: Secondary | ICD-10-CM | POA: Diagnosis not present

## 2015-12-13 DIAGNOSIS — D631 Anemia in chronic kidney disease: Secondary | ICD-10-CM | POA: Diagnosis not present

## 2015-12-13 DIAGNOSIS — Z79899 Other long term (current) drug therapy: Secondary | ICD-10-CM | POA: Diagnosis not present

## 2015-12-13 DIAGNOSIS — N186 End stage renal disease: Secondary | ICD-10-CM | POA: Diagnosis not present

## 2015-12-13 DIAGNOSIS — D509 Iron deficiency anemia, unspecified: Secondary | ICD-10-CM | POA: Diagnosis not present

## 2015-12-17 DIAGNOSIS — Z79899 Other long term (current) drug therapy: Secondary | ICD-10-CM | POA: Diagnosis not present

## 2015-12-17 DIAGNOSIS — N186 End stage renal disease: Secondary | ICD-10-CM | POA: Diagnosis not present

## 2015-12-17 DIAGNOSIS — D631 Anemia in chronic kidney disease: Secondary | ICD-10-CM | POA: Diagnosis not present

## 2015-12-17 DIAGNOSIS — D509 Iron deficiency anemia, unspecified: Secondary | ICD-10-CM | POA: Diagnosis not present

## 2015-12-20 DIAGNOSIS — N186 End stage renal disease: Secondary | ICD-10-CM | POA: Diagnosis not present

## 2015-12-20 DIAGNOSIS — Z992 Dependence on renal dialysis: Secondary | ICD-10-CM | POA: Diagnosis not present

## 2015-12-20 DIAGNOSIS — Z79899 Other long term (current) drug therapy: Secondary | ICD-10-CM | POA: Diagnosis not present

## 2015-12-20 DIAGNOSIS — D631 Anemia in chronic kidney disease: Secondary | ICD-10-CM | POA: Diagnosis not present

## 2015-12-20 DIAGNOSIS — D509 Iron deficiency anemia, unspecified: Secondary | ICD-10-CM | POA: Diagnosis not present

## 2015-12-20 DIAGNOSIS — I120 Hypertensive chronic kidney disease with stage 5 chronic kidney disease or end stage renal disease: Secondary | ICD-10-CM | POA: Diagnosis not present

## 2015-12-22 DIAGNOSIS — N2581 Secondary hyperparathyroidism of renal origin: Secondary | ICD-10-CM | POA: Diagnosis not present

## 2015-12-22 DIAGNOSIS — N186 End stage renal disease: Secondary | ICD-10-CM | POA: Diagnosis not present

## 2015-12-22 DIAGNOSIS — D631 Anemia in chronic kidney disease: Secondary | ICD-10-CM | POA: Diagnosis not present

## 2015-12-22 DIAGNOSIS — D509 Iron deficiency anemia, unspecified: Secondary | ICD-10-CM | POA: Diagnosis not present

## 2015-12-24 DIAGNOSIS — M109 Gout, unspecified: Secondary | ICD-10-CM | POA: Diagnosis not present

## 2015-12-24 DIAGNOSIS — E785 Hyperlipidemia, unspecified: Secondary | ICD-10-CM | POA: Diagnosis not present

## 2015-12-24 DIAGNOSIS — D631 Anemia in chronic kidney disease: Secondary | ICD-10-CM | POA: Diagnosis not present

## 2015-12-24 DIAGNOSIS — N186 End stage renal disease: Secondary | ICD-10-CM | POA: Diagnosis not present

## 2015-12-24 DIAGNOSIS — N2581 Secondary hyperparathyroidism of renal origin: Secondary | ICD-10-CM | POA: Diagnosis not present

## 2015-12-24 DIAGNOSIS — D509 Iron deficiency anemia, unspecified: Secondary | ICD-10-CM | POA: Diagnosis not present

## 2015-12-27 DIAGNOSIS — N2581 Secondary hyperparathyroidism of renal origin: Secondary | ICD-10-CM | POA: Diagnosis not present

## 2015-12-27 DIAGNOSIS — D509 Iron deficiency anemia, unspecified: Secondary | ICD-10-CM | POA: Diagnosis not present

## 2015-12-27 DIAGNOSIS — N186 End stage renal disease: Secondary | ICD-10-CM | POA: Diagnosis not present

## 2015-12-27 DIAGNOSIS — D631 Anemia in chronic kidney disease: Secondary | ICD-10-CM | POA: Diagnosis not present

## 2015-12-29 DIAGNOSIS — D631 Anemia in chronic kidney disease: Secondary | ICD-10-CM | POA: Diagnosis not present

## 2015-12-29 DIAGNOSIS — N2581 Secondary hyperparathyroidism of renal origin: Secondary | ICD-10-CM | POA: Diagnosis not present

## 2015-12-29 DIAGNOSIS — N186 End stage renal disease: Secondary | ICD-10-CM | POA: Diagnosis not present

## 2015-12-29 DIAGNOSIS — D509 Iron deficiency anemia, unspecified: Secondary | ICD-10-CM | POA: Diagnosis not present

## 2015-12-30 DIAGNOSIS — D631 Anemia in chronic kidney disease: Secondary | ICD-10-CM | POA: Diagnosis not present

## 2015-12-30 DIAGNOSIS — N186 End stage renal disease: Secondary | ICD-10-CM | POA: Diagnosis not present

## 2015-12-30 DIAGNOSIS — N2581 Secondary hyperparathyroidism of renal origin: Secondary | ICD-10-CM | POA: Diagnosis not present

## 2015-12-30 DIAGNOSIS — D509 Iron deficiency anemia, unspecified: Secondary | ICD-10-CM | POA: Diagnosis not present

## 2015-12-31 DIAGNOSIS — N2581 Secondary hyperparathyroidism of renal origin: Secondary | ICD-10-CM | POA: Diagnosis not present

## 2015-12-31 DIAGNOSIS — N186 End stage renal disease: Secondary | ICD-10-CM | POA: Diagnosis not present

## 2015-12-31 DIAGNOSIS — D631 Anemia in chronic kidney disease: Secondary | ICD-10-CM | POA: Diagnosis not present

## 2015-12-31 DIAGNOSIS — D509 Iron deficiency anemia, unspecified: Secondary | ICD-10-CM | POA: Diagnosis not present

## 2016-01-03 DIAGNOSIS — N2581 Secondary hyperparathyroidism of renal origin: Secondary | ICD-10-CM | POA: Diagnosis not present

## 2016-01-03 DIAGNOSIS — D509 Iron deficiency anemia, unspecified: Secondary | ICD-10-CM | POA: Diagnosis not present

## 2016-01-03 DIAGNOSIS — D631 Anemia in chronic kidney disease: Secondary | ICD-10-CM | POA: Diagnosis not present

## 2016-01-03 DIAGNOSIS — N186 End stage renal disease: Secondary | ICD-10-CM | POA: Diagnosis not present

## 2016-01-06 MED FILL — ETHYL CHLORIDE SPRAY: 30 days supply | Qty: 104 | Fill #3

## 2016-01-07 DIAGNOSIS — D631 Anemia in chronic kidney disease: Secondary | ICD-10-CM | POA: Diagnosis not present

## 2016-01-07 DIAGNOSIS — N2581 Secondary hyperparathyroidism of renal origin: Secondary | ICD-10-CM | POA: Diagnosis not present

## 2016-01-07 DIAGNOSIS — D509 Iron deficiency anemia, unspecified: Secondary | ICD-10-CM | POA: Diagnosis not present

## 2016-01-07 DIAGNOSIS — N186 End stage renal disease: Secondary | ICD-10-CM | POA: Diagnosis not present

## 2016-01-09 DIAGNOSIS — D509 Iron deficiency anemia, unspecified: Secondary | ICD-10-CM | POA: Diagnosis not present

## 2016-01-09 DIAGNOSIS — N2581 Secondary hyperparathyroidism of renal origin: Secondary | ICD-10-CM | POA: Diagnosis not present

## 2016-01-09 DIAGNOSIS — D631 Anemia in chronic kidney disease: Secondary | ICD-10-CM | POA: Diagnosis not present

## 2016-01-09 DIAGNOSIS — N186 End stage renal disease: Secondary | ICD-10-CM | POA: Diagnosis not present

## 2016-01-11 DIAGNOSIS — D509 Iron deficiency anemia, unspecified: Secondary | ICD-10-CM | POA: Diagnosis not present

## 2016-01-11 DIAGNOSIS — N186 End stage renal disease: Secondary | ICD-10-CM | POA: Diagnosis not present

## 2016-01-11 DIAGNOSIS — D631 Anemia in chronic kidney disease: Secondary | ICD-10-CM | POA: Diagnosis not present

## 2016-01-11 DIAGNOSIS — N2581 Secondary hyperparathyroidism of renal origin: Secondary | ICD-10-CM | POA: Diagnosis not present

## 2016-01-13 DIAGNOSIS — N186 End stage renal disease: Secondary | ICD-10-CM | POA: Diagnosis not present

## 2016-01-13 DIAGNOSIS — D509 Iron deficiency anemia, unspecified: Secondary | ICD-10-CM | POA: Diagnosis not present

## 2016-01-13 DIAGNOSIS — D631 Anemia in chronic kidney disease: Secondary | ICD-10-CM | POA: Diagnosis not present

## 2016-01-13 DIAGNOSIS — N2581 Secondary hyperparathyroidism of renal origin: Secondary | ICD-10-CM | POA: Diagnosis not present

## 2016-01-17 DIAGNOSIS — N2581 Secondary hyperparathyroidism of renal origin: Secondary | ICD-10-CM | POA: Diagnosis not present

## 2016-01-17 DIAGNOSIS — N186 End stage renal disease: Secondary | ICD-10-CM | POA: Diagnosis not present

## 2016-01-17 DIAGNOSIS — D631 Anemia in chronic kidney disease: Secondary | ICD-10-CM | POA: Diagnosis not present

## 2016-01-17 DIAGNOSIS — D509 Iron deficiency anemia, unspecified: Secondary | ICD-10-CM | POA: Diagnosis not present

## 2016-01-19 DIAGNOSIS — Z992 Dependence on renal dialysis: Secondary | ICD-10-CM | POA: Diagnosis not present

## 2016-01-19 DIAGNOSIS — N186 End stage renal disease: Secondary | ICD-10-CM | POA: Diagnosis not present

## 2016-01-19 DIAGNOSIS — I120 Hypertensive chronic kidney disease with stage 5 chronic kidney disease or end stage renal disease: Secondary | ICD-10-CM | POA: Diagnosis not present

## 2016-01-21 DIAGNOSIS — N186 End stage renal disease: Secondary | ICD-10-CM | POA: Diagnosis not present

## 2016-01-21 DIAGNOSIS — D509 Iron deficiency anemia, unspecified: Secondary | ICD-10-CM | POA: Diagnosis not present

## 2016-01-21 DIAGNOSIS — D631 Anemia in chronic kidney disease: Secondary | ICD-10-CM | POA: Diagnosis not present

## 2016-01-21 DIAGNOSIS — Z79899 Other long term (current) drug therapy: Secondary | ICD-10-CM | POA: Diagnosis not present

## 2016-01-22 DIAGNOSIS — N186 End stage renal disease: Secondary | ICD-10-CM | POA: Diagnosis not present

## 2016-01-22 DIAGNOSIS — D509 Iron deficiency anemia, unspecified: Secondary | ICD-10-CM | POA: Diagnosis not present

## 2016-01-22 DIAGNOSIS — D631 Anemia in chronic kidney disease: Secondary | ICD-10-CM | POA: Diagnosis not present

## 2016-01-22 DIAGNOSIS — Z79899 Other long term (current) drug therapy: Secondary | ICD-10-CM | POA: Diagnosis not present

## 2016-01-22 DIAGNOSIS — M109 Gout, unspecified: Secondary | ICD-10-CM | POA: Diagnosis not present

## 2016-01-23 IMAGING — CR DG CHEST 2V
2 series · 2 of 2 positions shown · non-contrast
Comparison: PA and lateral chest 01/02/2013 and 06/12/2013.

CLINICAL DATA: Cough, cold symptoms, shortness of breath.

EXAM:
CHEST  2 VIEW

[w chest pa]
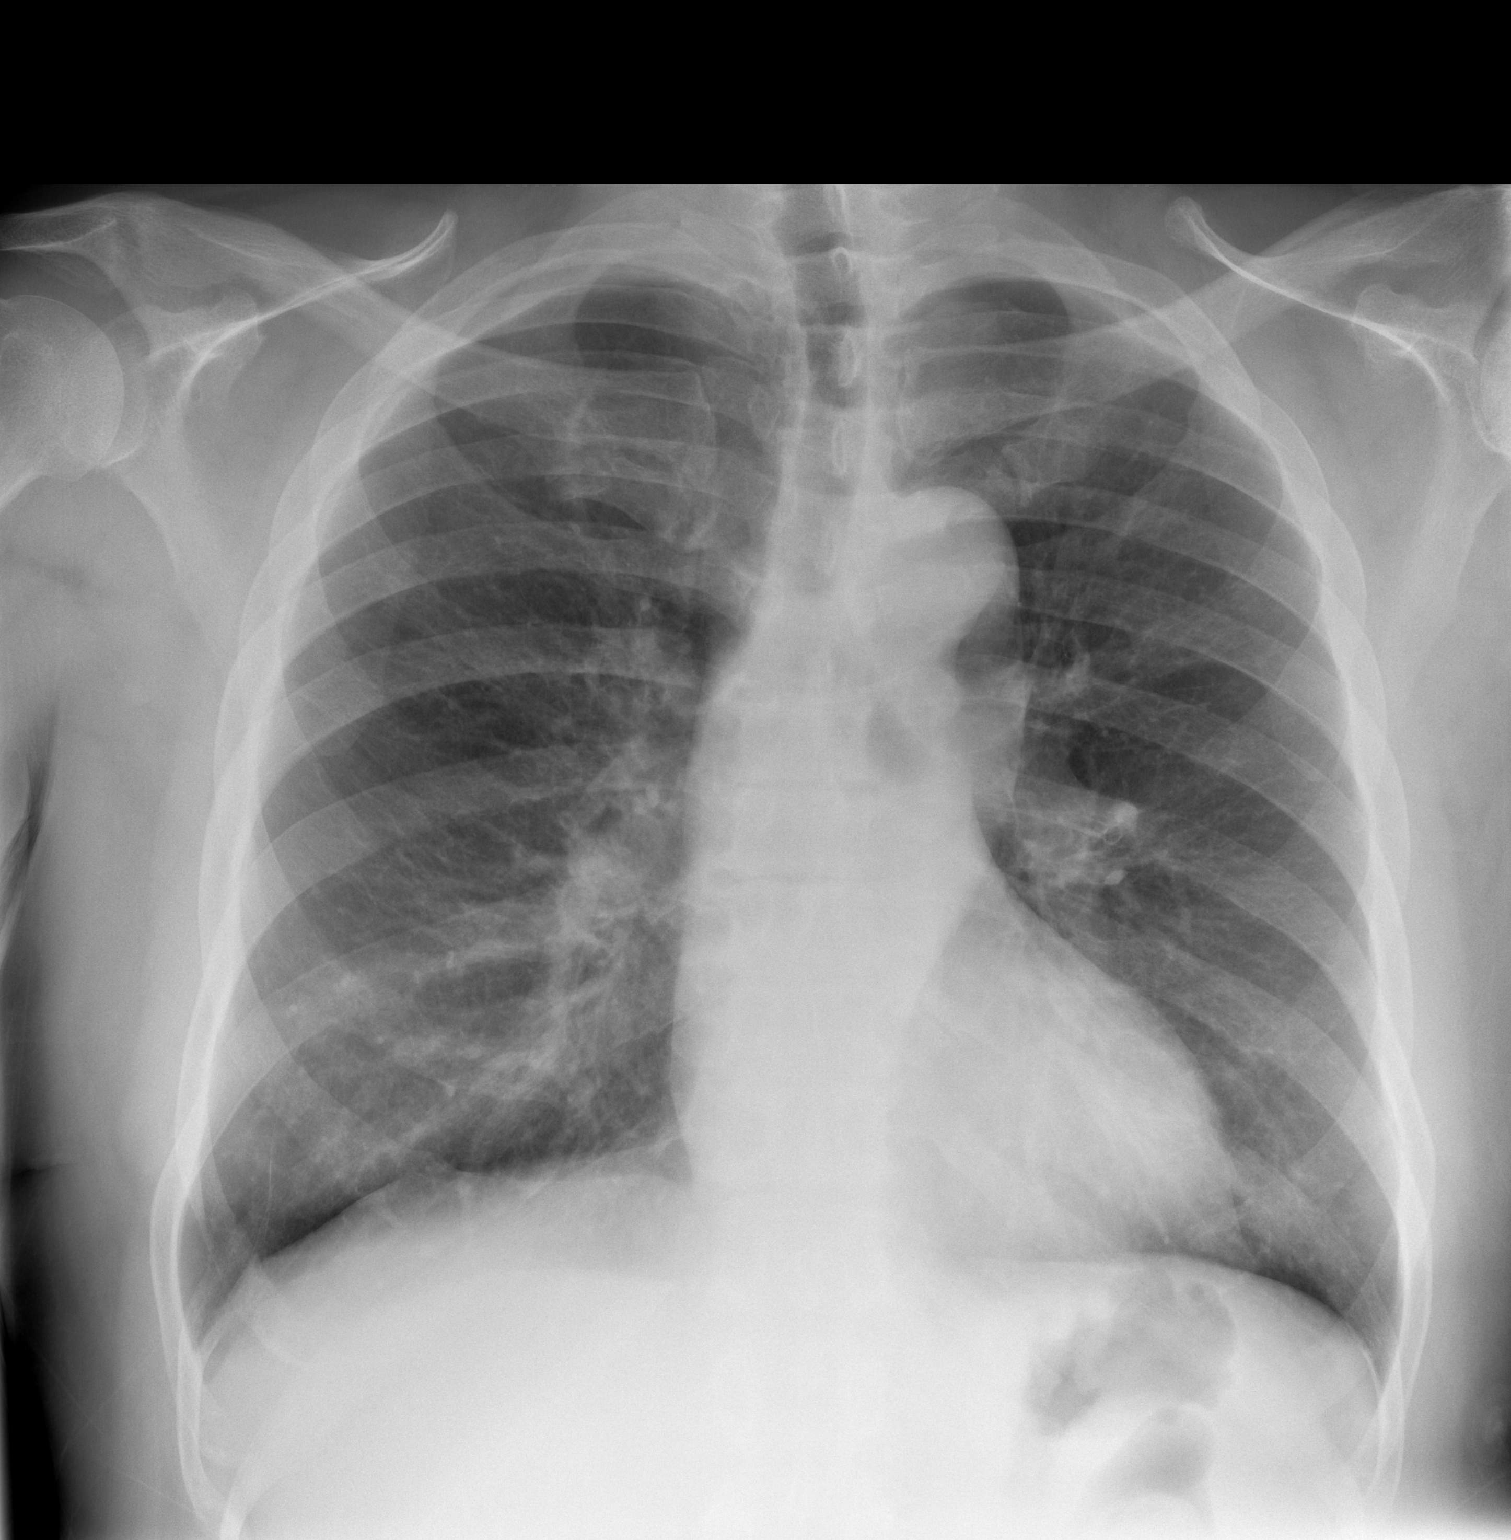

[w chest lat]
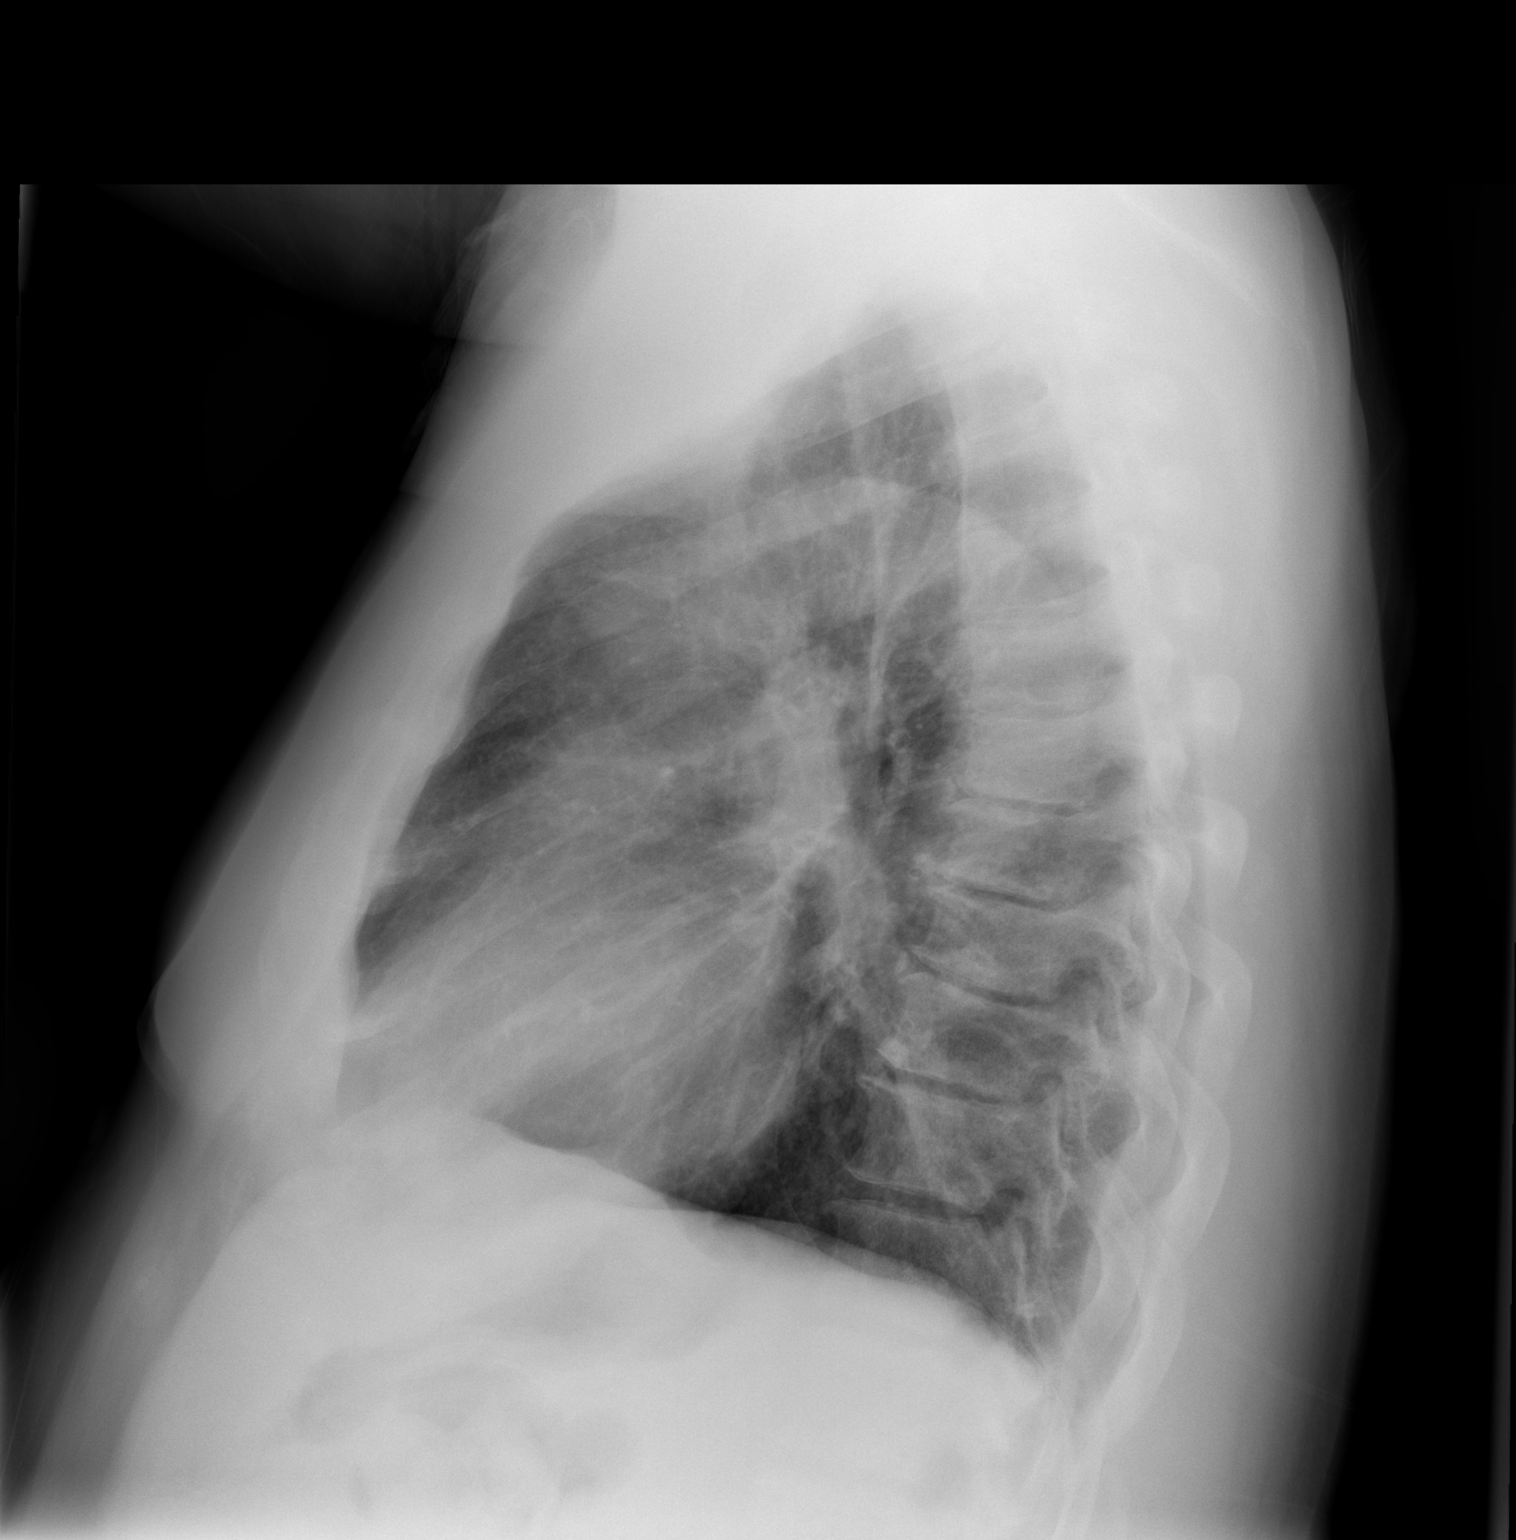

[2 of 2 positions shown; findings below may reference images not displayed]

FINDINGS: There is no focal airspace disease or effusion. Mild peribronchial
thickening is unchanged. No consolidative process is identified.
Heart size is normal. No focal bony abnormality.
IMPRESSION: No acute disease. Chronic bronchitic change appears stable compared
to prior exams.

## 2016-01-25 DIAGNOSIS — D631 Anemia in chronic kidney disease: Secondary | ICD-10-CM | POA: Diagnosis not present

## 2016-01-25 DIAGNOSIS — N186 End stage renal disease: Secondary | ICD-10-CM | POA: Diagnosis not present

## 2016-01-25 DIAGNOSIS — Z79899 Other long term (current) drug therapy: Secondary | ICD-10-CM | POA: Diagnosis not present

## 2016-01-25 DIAGNOSIS — D509 Iron deficiency anemia, unspecified: Secondary | ICD-10-CM | POA: Diagnosis not present

## 2016-01-27 ENCOUNTER — Other Ambulatory Visit: Payer: Self-pay | Admitting: Infectious Diseases

## 2016-01-27 DIAGNOSIS — D631 Anemia in chronic kidney disease: Secondary | ICD-10-CM | POA: Diagnosis not present

## 2016-01-27 DIAGNOSIS — N186 End stage renal disease: Secondary | ICD-10-CM | POA: Diagnosis not present

## 2016-01-27 DIAGNOSIS — D509 Iron deficiency anemia, unspecified: Secondary | ICD-10-CM | POA: Diagnosis not present

## 2016-01-27 DIAGNOSIS — B20 Human immunodeficiency virus [HIV] disease: Secondary | ICD-10-CM

## 2016-01-27 DIAGNOSIS — Z79899 Other long term (current) drug therapy: Secondary | ICD-10-CM | POA: Diagnosis not present

## 2016-01-27 MED ORDER — TENOFOVIR DISOPROXIL FUMARATE 300 MG PO TABS: 300.0000 mg | ORAL_TABLET | ORAL | Status: DC

## 2016-01-29 DIAGNOSIS — D509 Iron deficiency anemia, unspecified: Secondary | ICD-10-CM | POA: Diagnosis not present

## 2016-01-29 DIAGNOSIS — Z79899 Other long term (current) drug therapy: Secondary | ICD-10-CM | POA: Diagnosis not present

## 2016-01-29 DIAGNOSIS — N186 End stage renal disease: Secondary | ICD-10-CM | POA: Diagnosis not present

## 2016-01-29 DIAGNOSIS — D631 Anemia in chronic kidney disease: Secondary | ICD-10-CM | POA: Diagnosis not present

## 2016-01-31 DIAGNOSIS — D631 Anemia in chronic kidney disease: Secondary | ICD-10-CM | POA: Diagnosis not present

## 2016-01-31 DIAGNOSIS — D509 Iron deficiency anemia, unspecified: Secondary | ICD-10-CM | POA: Diagnosis not present

## 2016-01-31 DIAGNOSIS — Z79899 Other long term (current) drug therapy: Secondary | ICD-10-CM | POA: Diagnosis not present

## 2016-01-31 DIAGNOSIS — N186 End stage renal disease: Secondary | ICD-10-CM | POA: Diagnosis not present

## 2016-02-03 DIAGNOSIS — D631 Anemia in chronic kidney disease: Secondary | ICD-10-CM | POA: Diagnosis not present

## 2016-02-03 DIAGNOSIS — Z79899 Other long term (current) drug therapy: Secondary | ICD-10-CM | POA: Diagnosis not present

## 2016-02-03 DIAGNOSIS — N186 End stage renal disease: Secondary | ICD-10-CM | POA: Diagnosis not present

## 2016-02-03 DIAGNOSIS — D509 Iron deficiency anemia, unspecified: Secondary | ICD-10-CM | POA: Diagnosis not present

## 2016-02-07 ENCOUNTER — Other Ambulatory Visit: Payer: Self-pay

## 2016-02-07 ENCOUNTER — Ambulatory Visit (HOSPITAL_COMMUNITY)
Admission: RE | Admit: 2016-02-07 | Discharge: 2016-02-07 | Disposition: A | Discharge status: Home / Self Care | Payer: Medicare Other | Source: Ambulatory Visit | Attending: Vascular Surgery | Admitting: Vascular Surgery

## 2016-02-07 ENCOUNTER — Encounter (HOSPITAL_COMMUNITY): Payer: Self-pay | Admitting: Surgery

## 2016-02-07 ENCOUNTER — Ambulatory Visit (HOSPITAL_COMMUNITY): Payer: Medicare Other | Admitting: Certified Registered Nurse Anesthetist

## 2016-02-07 ENCOUNTER — Encounter: Payer: Self-pay | Admitting: Surgery

## 2016-02-07 ENCOUNTER — Ambulatory Visit (INDEPENDENT_AMBULATORY_CARE_PROVIDER_SITE_OTHER): Payer: Medicare Other | Admitting: Surgery

## 2016-02-07 ENCOUNTER — Encounter (HOSPITAL_COMMUNITY)
Admission: RE | Disposition: A | Discharge status: Home / Self Care | Payer: Self-pay | Source: Ambulatory Visit | Attending: Vascular Surgery

## 2016-02-07 VITALS — BP 150/82 | HR 63 | Resp 16 | Ht 72.0 in | Wt 218.0 lb

## 2016-02-07 DIAGNOSIS — M1990 Unspecified osteoarthritis, unspecified site: Secondary | ICD-10-CM | POA: Diagnosis not present

## 2016-02-07 DIAGNOSIS — T82898A Other specified complication of vascular prosthetic devices, implants and grafts, initial encounter: Secondary | ICD-10-CM | POA: Insufficient documentation

## 2016-02-07 DIAGNOSIS — F419 Anxiety disorder, unspecified: Secondary | ICD-10-CM | POA: Diagnosis not present

## 2016-02-07 DIAGNOSIS — Z880 Allergy status to penicillin: Secondary | ICD-10-CM | POA: Diagnosis not present

## 2016-02-07 DIAGNOSIS — Z87891 Personal history of nicotine dependence: Secondary | ICD-10-CM | POA: Insufficient documentation

## 2016-02-07 DIAGNOSIS — T82838D Hemorrhage of vascular prosthetic devices, implants and grafts, subsequent encounter: Secondary | ICD-10-CM | POA: Diagnosis not present

## 2016-02-07 DIAGNOSIS — I120 Hypertensive chronic kidney disease with stage 5 chronic kidney disease or end stage renal disease: Secondary | ICD-10-CM | POA: Insufficient documentation

## 2016-02-07 DIAGNOSIS — T82838A Hemorrhage of vascular prosthetic devices, implants and grafts, initial encounter: Secondary | ICD-10-CM | POA: Diagnosis not present

## 2016-02-07 DIAGNOSIS — N186 End stage renal disease: Secondary | ICD-10-CM

## 2016-02-07 DIAGNOSIS — Y832 Surgical operation with anastomosis, bypass or graft as the cause of abnormal reaction of the patient, or of later complication, without mention of misadventure at the time of the procedure: Secondary | ICD-10-CM | POA: Insufficient documentation

## 2016-02-07 DIAGNOSIS — Z21 Asymptomatic human immunodeficiency virus [HIV] infection status: Secondary | ICD-10-CM | POA: Diagnosis not present

## 2016-02-07 DIAGNOSIS — D649 Anemia, unspecified: Secondary | ICD-10-CM | POA: Diagnosis not present

## 2016-02-07 DIAGNOSIS — Z992 Dependence on renal dialysis: Secondary | ICD-10-CM | POA: Diagnosis not present

## 2016-02-07 DIAGNOSIS — J449 Chronic obstructive pulmonary disease, unspecified: Secondary | ICD-10-CM | POA: Insufficient documentation

## 2016-02-07 HISTORY — PX: REVISON OF ARTERIOVENOUS FISTULA: SHX6074

## 2016-02-07 LAB — POCT I-STAT 4, (NA,K, GLUC, HGB,HCT)
GLUCOSE: 88 mg/dL (ref 65–99)
HEMATOCRIT: 31 % — AB (ref 39.0–52.0)
HEMOGLOBIN: 10.5 g/dL — AB (ref 13.0–17.0)
Potassium: 3.8 mmol/L (ref 3.5–5.1)
Sodium: 135 mmol/L (ref 135–145)

## 2016-02-07 SURGERY — REVISON OF ARTERIOVENOUS FISTULA
Anesthesia: Monitor Anesthesia Care | Laterality: Left

## 2016-02-07 MED ORDER — SODIUM CHLORIDE 0.9 % IR SOLN
Status: DC | PRN
  Administered 2016-02-07: 1000 mL

## 2016-02-07 MED ORDER — PROPOFOL 10 MG/ML IV BOLUS
INTRAVENOUS | Status: DC | PRN
  Administered 2016-02-07: 30 mg via INTRAVENOUS

## 2016-02-07 MED ORDER — PROPOFOL 500 MG/50ML IV EMUL
INTRAVENOUS | Status: DC | PRN
  Administered 2016-02-07: 50 ug/kg/min via INTRAVENOUS

## 2016-02-07 MED ORDER — CHLORHEXIDINE GLUCONATE CLOTH 2 % EX PADS: 6.0000 | MEDICATED_PAD | Freq: Once | CUTANEOUS | Status: DC

## 2016-02-07 MED ORDER — THROMBIN 20000 UNITS EX SOLR
CUTANEOUS | Status: AC
  Filled 2016-02-07: qty 20000

## 2016-02-07 MED ORDER — HEPARIN SODIUM (PORCINE) 1000 UNIT/ML IJ SOLN
INTRAMUSCULAR | Status: DC | PRN
  Administered 2016-02-07: 7000 [IU] via INTRAVENOUS

## 2016-02-07 MED ORDER — LIDOCAINE HCL (CARDIAC) 20 MG/ML IV SOLN
INTRAVENOUS | Status: DC | PRN
  Administered 2016-02-07: 50 mg via INTRAVENOUS

## 2016-02-07 MED ORDER — PROTAMINE SULFATE 10 MG/ML IV SOLN
INTRAVENOUS | Status: AC
  Filled 2016-02-07: qty 5

## 2016-02-07 MED ORDER — LIDOCAINE-EPINEPHRINE (PF) 1 %-1:200000 IJ SOLN
INTRAMUSCULAR | Status: AC
  Filled 2016-02-07: qty 30

## 2016-02-07 MED ORDER — VANCOMYCIN HCL IN DEXTROSE 1-5 GM/200ML-% IV SOLN
INTRAVENOUS | Status: AC
  Filled 2016-02-07: qty 200

## 2016-02-07 MED ORDER — OXYCODONE-ACETAMINOPHEN 5-325 MG PO TABS: 1.0000 | ORAL_TABLET | ORAL | Status: DC | PRN

## 2016-02-07 MED ORDER — VANCOMYCIN HCL IN DEXTROSE 1-5 GM/200ML-% IV SOLN
1000.0000 mg | INTRAVENOUS | Status: AC
  Administered 2016-02-07: 1000 mg via INTRAVENOUS

## 2016-02-07 MED ORDER — PROTAMINE SULFATE 10 MG/ML IV SOLN
INTRAVENOUS | Status: DC | PRN
  Administered 2016-02-07: 50 mg via INTRAVENOUS

## 2016-02-07 MED ORDER — LIDOCAINE-EPINEPHRINE (PF) 1 %-1:200000 IJ SOLN
INTRAMUSCULAR | Status: DC | PRN
  Administered 2016-02-07: 2 mL

## 2016-02-07 MED ORDER — PROPOFOL 10 MG/ML IV BOLUS
INTRAVENOUS | Status: AC
  Filled 2016-02-07: qty 20

## 2016-02-07 MED ORDER — SODIUM CHLORIDE 0.9 % IV SOLN
INTRAVENOUS | Status: DC
  Administered 2016-02-07 (×2): via INTRAVENOUS

## 2016-02-07 MED ORDER — FENTANYL CITRATE (PF) 250 MCG/5ML IJ SOLN
INTRAMUSCULAR | Status: DC | PRN
  Administered 2016-02-07: 25 ug via INTRAVENOUS

## 2016-02-07 MED ORDER — HEPARIN SODIUM (PORCINE) 1000 UNIT/ML IJ SOLN
INTRAMUSCULAR | Status: AC
  Filled 2016-02-07: qty 1

## 2016-02-07 MED ORDER — FENTANYL CITRATE (PF) 250 MCG/5ML IJ SOLN
INTRAMUSCULAR | Status: AC
  Filled 2016-02-07: qty 5

## 2016-02-07 MED ORDER — SODIUM CHLORIDE 0.9 % IV SOLN
INTRAVENOUS | Status: DC | PRN
  Administered 2016-02-07: 500 mL

## 2016-02-07 SURGICAL SUPPLY — 30 items
CANISTER SUCTION 2500CC (MISCELLANEOUS) ×2 IMPLANT
CANNULA VESSEL 3MM 2 BLNT TIP (CANNULA) ×2 IMPLANT
CLIP TI MEDIUM 6 (CLIP) ×2 IMPLANT
CLIP TI WIDE RED SMALL 6 (CLIP) ×2 IMPLANT
COVER PROBE W GEL 5X96 (DRAPES) IMPLANT
ELECT REM PT RETURN 9FT ADLT (ELECTROSURGICAL) ×2
ELECTRODE REM PT RTRN 9FT ADLT (ELECTROSURGICAL) ×1 IMPLANT
GLOVE BIO SURGEON STRL SZ7.5 (GLOVE) ×2 IMPLANT
GLOVE BIOGEL PI IND STRL 6.5 (GLOVE) ×1 IMPLANT
GLOVE BIOGEL PI IND STRL 8 (GLOVE) ×1 IMPLANT
GLOVE BIOGEL PI INDICATOR 6.5 (GLOVE) ×1
GLOVE BIOGEL PI INDICATOR 8 (GLOVE) ×1
GLOVE ECLIPSE 6.5 STRL STRAW (GLOVE) ×2 IMPLANT
GLOVE ECLIPSE 7.5 STRL STRAW (GLOVE) ×2 IMPLANT
GOWN STRL REUS W/ TWL LRG LVL3 (GOWN DISPOSABLE) ×2 IMPLANT
GOWN STRL REUS W/TWL LRG LVL3 (GOWN DISPOSABLE) ×2
GOWN STRL REUS W/TWL XL LVL3 (GOWN DISPOSABLE) ×2 IMPLANT
KIT BASIN OR (CUSTOM PROCEDURE TRAY) ×2 IMPLANT
KIT ROOM TURNOVER OR (KITS) ×2 IMPLANT
LIQUID BAND (GAUZE/BANDAGES/DRESSINGS) ×2 IMPLANT
NS IRRIG 1000ML POUR BTL (IV SOLUTION) ×2 IMPLANT
PACK CV ACCESS (CUSTOM PROCEDURE TRAY) ×2 IMPLANT
PAD ARMBOARD 7.5X6 YLW CONV (MISCELLANEOUS) ×4 IMPLANT
SPONGE SURGIFOAM ABS GEL 100 (HEMOSTASIS) IMPLANT
SUT PROLENE 6 0 BV (SUTURE) ×2 IMPLANT
SUT VIC AB 3-0 SH 27 (SUTURE) ×1
SUT VIC AB 3-0 SH 27X BRD (SUTURE) ×1 IMPLANT
SUT VICRYL 4-0 PS2 18IN ABS (SUTURE) ×2 IMPLANT
UNDERPAD 30X30 INCONTINENT (UNDERPADS AND DIAPERS) ×2 IMPLANT
WATER STERILE IRR 1000ML POUR (IV SOLUTION) ×2 IMPLANT

## 2016-02-07 NOTE — H&P (View-Only) (Signed)
[facility] [place] [facility]  Patient name: Jeffrey [name] MRN: [id number] DOB: Jan 13, 1956 Sex: male  REASON FOR VISIT: Fistula bleeding  HPI: Jeffrey [name] is a 60 y.o. male who is status post left radiocephalic fistula creation by Dr. Bridgett Larsson in 2012.  He does home hemodialysis.  In January 2017 he presented with bleeding from his right radiocephalic fistula and underwent revision of the fistula by Dr. Scot Dock.  The patient was referred today by Dr. Augustin Coupe for ulceration at his buttonhole site.  The patient states that this past Tuesday he had a significant bleeding episode from this area.  He notes constant drainage from this area and keeps it wrapped tightly with gauze and tape.  Past Medical History  Diagnosis Date  . Hypertension   . Gout   . Anemia   . Thrombocytopenia ([place])   . Anxiety   . Hepatitis B carrier   . HIV infection ([place])   . Asthma     per pt hx  . End stage renal disease on home HD 07/10/2011    Started HD in September 2012 at [facility] with a tunneled HD catheter, now on home HD with NxtStage. Dialyzing through AVF L lower arm with buttonhole technique as of mid 2014. His brother does the HD treatments at home.  They are roommates for 23 years.  The brother works 3rd shift and gets off about 8am and then puts Jeffrey [name] on HD in the morning after getting home. Most of the time he does HD about 4 times a week, for about 4 hours per treatment. Cause of ESRD was HTN according to patient. He says he let his health go and ending up with complications, and that he didn't like seeing doctors in those days.  He says he was diagnosed with severe HTN when he lived in [place] in his 15's.   . Pneumonia   . Hyperthyroidism   . Seizures ([place]) 06/02/2011    pt denies this  . Seizure ([place])   . Arthritis     Family History  Problem Relation Age of Onset  . Hypertension Mother   . Cancer Father     SOCIAL HISTORY: Social History  Substance Use Topics  . Smoking status:  Former Smoker    Types: Cigarettes    Quit date: 08/08/2015  . Smokeless tobacco: Never Used  . Alcohol Use: No    Allergies  Allergen Reactions  . Lisinopril Shortness Of Breath and Swelling    Throat swelling  . Penicillins Other (See Comments)    Childhood allergy Has patient had a PCN reaction causing immediate rash, facial/tongue/throat swelling, SOB or lightheadedness with hypotension: Unknown Has patient had a PCN reaction causing severe rash involving mucus membranes or skin necrosis: Unknown Has patient had a PCN reaction that required hospitalization: Unknown Has patient had a PCN reaction occurring within the last 10 years: Unknown If all of the above answers are "NO", then may proceed with Cephalosporin use.     Current Outpatient Prescriptions  Medication Sig Dispense Refill  . albuterol (PROVENTIL) (2.5 MG/3ML) 0.083% nebulizer solution Take 2.5 mg by nebulization every 6 (six) hours as needed for wheezing or shortness of breath.    Marland Kitchen amLODipine (NORVASC) 10 MG tablet Take 10 mg by mouth 2 (two) times daily.    . calcitRIOL (ROCALTROL) 0.25 MCG capsule Take 0.25 mcg by mouth daily.    . calcium acetate (PHOSLO) 667 MG capsule Take 1,334-2,001 mg by mouth  3 (three) times daily with meals. 3 caps with a meal, 2 caps with a snack    . carvedilol (COREG) 25 MG tablet Take 25 mg by mouth 2 (two) times daily with a meal.    . cloNIDine (CATAPRES) 0.1 MG tablet 1 tablet 2 (two) times daily.  6  . EPIVIR HBV 5 MG/ML solution TAKE 5 ML BY MOUTH ONCE DAILY 240 mL 3  . ethyl chloride spray Apply 1 application topically daily as needed. At HD  12  . ISENTRESS 400 MG tablet TAKE 1 TABLET(400 MG) BY MOUTH TWICE DAILY 60 tablet 5  . multivitamin (RENA-VIT) TABS tablet Take 1 tablet by mouth daily.      Marland Kitchen oxyCODONE-acetaminophen (ROXICET) 5-325 MG tablet Take 1 tablet by mouth every 6 (six) hours as needed. 20 tablet 0  . tenofovir (VIREAD) 300 MG tablet Take 1 tablet (300 mg total)  by mouth every Saturday. After dialysis on Saturday 5 tablet 5  . traZODone (DESYREL) 50 MG tablet Take 50 mg by mouth at bedtime as needed for sleep.     No current facility-administered medications for this visit.    REVIEW OF SYSTEMS:  [X]  denotes positive finding, [ ]  denotes negative finding Cardiac  Comments:  Chest pain or chest pressure:    Shortness of breath upon exertion:    Short of breath when lying flat:    Irregular heart rhythm:        Vascular    Pain in calf, thigh, or hip brought on by ambulation:    Pain in feet at night that wakes you up from your sleep:     Blood clot in your veins:    Leg swelling:     Bleeding from fistula  x   Pulmonary    Oxygen at home:    Productive cough:     Wheezing:         Neurologic    Sudden weakness in arms or legs:     Sudden numbness in arms or legs:     Sudden onset of difficulty speaking or slurred speech:    Temporary loss of vision in one eye:     Problems with dizziness:         Gastrointestinal    Blood in stool:     Vomited blood:         Genitourinary    Burning when urinating:     Blood in urine:        Psychiatric    Major depression:         Hematologic    Bleeding problems:    Problems with blood clotting too easily:        Skin    Rashes or ulcers:        Constitutional    Fever or chills:      PHYSICAL EXAM: Filed Vitals:   02/07/16 0916  BP: 150/82  Pulse: 63  Resp: 16  Height: 6' (1.829 m)  Weight: 218 lb (98.884 kg)  SpO2: 98%    GENERAL: The patient is a well-nourished male, in no acute distress. The vital signs are documented above. CARDIAC: There is a regular rate and rhythm.  VASCULAR: The patient has a buttonhole site with a eschar over top of it and serous sanguinous drainage here in the office today. PULMONARY: There is good air exchange bilaterally without wheezing or rales. ABDOMEN: Soft and non-tender with normal pitched bowel sounds.  MUSCULOSKELETAL: There are no  major  deformities or cyanosis. NEUROLOGIC: No focal weakness or paresthesias are detected. SKIN: There are no ulcers or rashes noted. PSYCHIATRIC: The patient has a normal affect.  DATA:  The patient states that it has been a while since he had a fistulogram  MEDICAL ISSUES: Bleeding from buttonhole cannulation site of his left radiocephalic fistula: There is continuous oozing from this site currently while he is in the office.  There is an eschar over top of the buttonhole.  I told the patient that I am very concerned that he may have another significant bleeding issue in the immediate future and therefore I have recommended going to the hospital to have this repaired to prevent further bleeding.  He is amenable to do this.  I spoke with Dr. Scot Dock and he will try to be worked and later on this afternoon.  The patient understands that he will likely have a wait at the hospital.    Annamarie Major [facility]: [phone]

## 2016-02-07 NOTE — Op Note (Signed)
    NAME: Jeffrey [name]    MRN: [id number] DOB: August 12, 1956    DATE OF OPERATION: 02/07/2016  PREOP DIAGNOSIS: bleeding from left radiocephalic AV fistula  POSTOP DIAGNOSIS: same  PROCEDURE: re  pair of pseudoaneurysm left radial cephalic AV fistula  SURGEON: Judeth Cornfield. Scot Dock, MD, FACS  ASSIST: none  ANESTHESIA: local with sedation   EBL: inimal  INDICATIONS: Jeffrey [name] is a 60 y.o. male who has a left radiocephalic AV fistula. One of the buttonholes had been bleeding and he was seen in the office today and set up for repair of this given the risk for catastrophic bleeding.  FINDINGS: the defect was repaired with 2 interrupted 6-0 Prolene sutures.  TECHNIQUE: The patient was taken to the operating room and sedated by anesthesia. The left upper extremity was prepped and draped in usual sterile fashion. A transverse elliptical incision was made overlying the area of concern. I was able to dissected out the vein proximal and distal to this enough that the skin could be closed over the repair once this was done. The patient was then heparinized. The assistant then compressed the fistula proximal and distal to the area of concern and I excised the pseudoaneurysm. This was repaired with 2 interrupted 60 horizontal mattress sutures.Pressure was released and it was excellent hemostasis. I then closed the wound with 2 layers. The deep layer was 3-0 Vicryl. The skin was closed with 4-0 Vicryl. Sterile dressing was applied. The patient tolerated the procedure well was transferred to the recovery room in stable condition. All needle and sponge counts were correct.  [name], MD, FACS [facility]  DATE OF DICTATION:   02/07/2016

## 2016-02-07 NOTE — Anesthesia Preprocedure Evaluation (Signed)
Anesthesia Evaluation  Patient identified by MRN, date of birth, ID band Patient awake    Reviewed: Allergy & Precautions, NPO status , Patient's Chart, lab work & pertinent test results  History of Anesthesia Complications Negative for: history of anesthetic complications  Airway Mallampati: II  TM Distance: >3 FB Neck ROM: Full    Dental  (+) Edentulous Upper, Edentulous Lower   Pulmonary neg shortness of breath, neg sleep apnea, COPD,  COPD inhaler, neg recent URI, former smoker,    breath sounds clear to auscultation       Cardiovascular hypertension, Pt. on medications and Pt. on home beta blockers (-) angina(-) Past MI and (-) CHF  Rhythm:Regular     Neuro/Psych Seizures -,  PSYCHIATRIC DISORDERS Anxiety    GI/Hepatic negative GI ROS, (+) Hepatitis -, B  Endo/Other  negative endocrine ROS  Renal/GU ESRF and DialysisRenal disease     Musculoskeletal  (+) Arthritis ,   Abdominal   Peds  Hematology  (+) anemia ,   Anesthesia Other Findings   Reproductive/Obstetrics                             Anesthesia Physical Anesthesia Plan  ASA: III  Anesthesia Plan: MAC and General   Post-op Pain Management:    Induction: Intravenous  Airway Management Planned: LMA, Natural Airway, Nasal Cannula and Simple Face Mask  Additional Equipment: None  Intra-op Plan:   Post-operative Plan:   Informed Consent: I have reviewed the patients History and Physical, chart, labs and discussed the procedure including the risks, benefits and alternatives for the proposed anesthesia with the patient or authorized representative who has indicated his/her understanding and acceptance.   Dental advisory given  Plan Discussed with: CRNA and Surgeon  Anesthesia Plan Comments:         Anesthesia Quick Evaluation

## 2016-02-07 NOTE — Progress Notes (Signed)
[facility] [place]  Patient name: Jeffrey [name] MRN: [id number] DOB: Aug 27, 1956 Sex: male  REASON FOR VISIT: Fistula bleeding  HPI: Jeffrey [name] is a 60 y.o. male who is status post left radiocephalic fistula creation by Dr. Bridgett Larsson in 2012.  He does home hemodialysis.  In January 2017 he presented with bleeding from his right radiocephalic fistula and underwent revision of the fistula by Dr. Scot Dock.  The patient was referred today by Dr. Augustin Coupe for ulceration at his buttonhole site.  The patient states that this past Tuesday he had a significant bleeding episode from this area.  He notes constant drainage from this area and keeps it wrapped tightly with gauze and tape.  Past Medical History  Diagnosis Date  . Hypertension   . Gout   . Anemia   . Thrombocytopenia ([place])   . Anxiety   . Hepatitis B carrier   . HIV infection ([place])   . Asthma     per pt hx  . End stage renal disease on home HD 07/10/2011    Started HD in September 2012 at [facility] with a tunneled HD catheter, now on home HD with NxtStage. Dialyzing through AVF L lower arm with buttonhole technique as of mid 2014. His brother does the HD treatments at home.  They are roommates for 23 years.  The brother works 3rd shift and gets off about 8am and then puts Mr Setters on HD in the morning after getting home. Most of the time he does HD about 4 times a week, for about 4 hours per treatment. Cause of ESRD was HTN according to patient. He says he let his health go and ending up with complications, and that he didn't like seeing doctors in those days.  He says he was diagnosed with severe HTN when he lived in [place] in his 15's.   . Pneumonia   . Hyperthyroidism   . Seizures ([place]) 06/02/2011    pt denies this  . Seizure ([place])   . Arthritis     Family History  Problem Relation Age of Onset  . Hypertension Mother   . Cancer Father     SOCIAL HISTORY: Social History  Substance Use Topics  . Smoking status:  Former Smoker    Types: Cigarettes    Quit date: 08/08/2015  . Smokeless tobacco: Never Used  . Alcohol Use: No    Allergies  Allergen Reactions  . Lisinopril Shortness Of Breath and Swelling    Throat swelling  . Penicillins Other (See Comments)    Childhood allergy Has patient had a PCN reaction causing immediate rash, facial/tongue/throat swelling, SOB or lightheadedness with hypotension: Unknown Has patient had a PCN reaction causing severe rash involving mucus membranes or skin necrosis: Unknown Has patient had a PCN reaction that required hospitalization: Unknown Has patient had a PCN reaction occurring within the last 10 years: Unknown If all of the above answers are "NO", then may proceed with Cephalosporin use.     Current Outpatient Prescriptions  Medication Sig Dispense Refill  . albuterol (PROVENTIL) (2.5 MG/3ML) 0.083% nebulizer solution Take 2.5 mg by nebulization every 6 (six) hours as needed for wheezing or shortness of breath.    Marland Kitchen amLODipine (NORVASC) 10 MG tablet Take 10 mg by mouth 2 (two) times daily.    . calcitRIOL (ROCALTROL) 0.25 MCG capsule Take 0.25 mcg by mouth daily.    . calcium acetate (PHOSLO) 667 MG capsule Take 1,334-2,001 mg by mouth  3 (three) times daily with meals. 3 caps with a meal, 2 caps with a snack    . carvedilol (COREG) 25 MG tablet Take 25 mg by mouth 2 (two) times daily with a meal.    . cloNIDine (CATAPRES) 0.1 MG tablet 1 tablet 2 (two) times daily.  6  . EPIVIR HBV 5 MG/ML solution TAKE 5 ML BY MOUTH ONCE DAILY 240 mL 3  . ethyl chloride spray Apply 1 application topically daily as needed. At HD  12  . ISENTRESS 400 MG tablet TAKE 1 TABLET(400 MG) BY MOUTH TWICE DAILY 60 tablet 5  . multivitamin (RENA-VIT) TABS tablet Take 1 tablet by mouth daily.      Marland Kitchen oxyCODONE-acetaminophen (ROXICET) 5-325 MG tablet Take 1 tablet by mouth every 6 (six) hours as needed. 20 tablet 0  . tenofovir (VIREAD) 300 MG tablet Take 1 tablet (300 mg total)  by mouth every Saturday. After dialysis on Saturday 5 tablet 5  . traZODone (DESYREL) 50 MG tablet Take 50 mg by mouth at bedtime as needed for sleep.     No current facility-administered medications for this visit.    REVIEW OF SYSTEMS:  [X]  denotes positive finding, [ ]  denotes negative finding Cardiac  Comments:  Chest pain or chest pressure:    Shortness of breath upon exertion:    Short of breath when lying flat:    Irregular heart rhythm:        Vascular    Pain in calf, thigh, or hip brought on by ambulation:    Pain in feet at night that wakes you up from your sleep:     Blood clot in your veins:    Leg swelling:     Bleeding from fistula  x   Pulmonary    Oxygen at home:    Productive cough:     Wheezing:         Neurologic    Sudden weakness in arms or legs:     Sudden numbness in arms or legs:     Sudden onset of difficulty speaking or slurred speech:    Temporary loss of vision in one eye:     Problems with dizziness:         Gastrointestinal    Blood in stool:     Vomited blood:         Genitourinary    Burning when urinating:     Blood in urine:        Psychiatric    Major depression:         Hematologic    Bleeding problems:    Problems with blood clotting too easily:        Skin    Rashes or ulcers:        Constitutional    Fever or chills:      PHYSICAL EXAM: Filed Vitals:   02/07/16 0916  BP: 150/82  Pulse: 63  Resp: 16  Height: 6' (1.829 m)  Weight: 218 lb (98.884 kg)  SpO2: 98%    GENERAL: The patient is a well-nourished male, in no acute distress. The vital signs are documented above. CARDIAC: There is a regular rate and rhythm.  VASCULAR: The patient has a buttonhole site with a eschar over top of it and serous sanguinous drainage here in the office today. PULMONARY: There is good air exchange bilaterally without wheezing or rales. ABDOMEN: Soft and non-tender with normal pitched bowel sounds.  MUSCULOSKELETAL: There are no  major  deformities or cyanosis. NEUROLOGIC: No focal weakness or paresthesias are detected. SKIN: There are no ulcers or rashes noted. PSYCHIATRIC: The patient has a normal affect.  DATA:  The patient states that it has been a while since he had a fistulogram  MEDICAL ISSUES: Bleeding from buttonhole cannulation site of his left radiocephalic fistula: There is continuous oozing from this site currently while he is in the office.  There is an eschar over top of the buttonhole.  I told the patient that I am very concerned that he may have another significant bleeding issue in the immediate future and therefore I have recommended going to the hospital to have this repaired to prevent further bleeding.  He is amenable to do this.  I spoke with Dr. Scot Dock and he will try to be worked and later on this afternoon.  The patient understands that he will likely have a wait at the hospital.    Annamarie Major [facility]: [phone]

## 2016-02-07 NOTE — Transfer of Care (Signed)
Immediate Anesthesia Transfer of Care Note  Patient: Jeffrey [name]  Procedure(s) Performed: Procedure(s): REPAIR OF PSEUDO-ANEUREYSM OF LEFT ARM  ARTERIOVENOUS FISTULA (Left)  Patient Location: PACU  Anesthesia Type:MAC  Level of Consciousness: awake, alert , oriented and patient cooperative  Airway & Oxygen Therapy: Patient Spontanous Breathing  Post-op Assessment: Report given to RN and Post -op Vital signs reviewed and stable  Post vital signs: Reviewed and stable  Last Vitals:  Filed Vitals:   02/07/16 1247  BP: 143/72  Pulse: 61  Temp: 36.7 C  Resp: 18    Last Pain:  Filed Vitals:   02/07/16 1301  PainSc: 8       Patients Stated Pain Goal: 6 ([id number])  Complications: No apparent anesthesia complications

## 2016-02-07 NOTE — Interval H&P Note (Signed)
History and Physical Interval Note:  02/07/2016 3:59 PM  [name]  has presented today for surgery, with the diagnosis of Bleeding left arm arteriovenous fistula T82.838D; End Stage Renal Disease N18.6  The various methods of treatment have been discussed with the patient and family. After consideration of risks, benefits and other options for treatment, the patient has consented to  Procedure(s): REVISON OF ARTERIOVENOUS FISTULA (Left) as a surgical intervention .  The patient's history has been reviewed, patient examined, no change in status, stable for surgery.  I have reviewed the patient's chart and labs.  Questions were answered to the patient's satisfaction.     [name]

## 2016-02-08 DIAGNOSIS — D631 Anemia in chronic kidney disease: Secondary | ICD-10-CM | POA: Diagnosis not present

## 2016-02-08 DIAGNOSIS — D509 Iron deficiency anemia, unspecified: Secondary | ICD-10-CM | POA: Diagnosis not present

## 2016-02-08 DIAGNOSIS — N186 End stage renal disease: Secondary | ICD-10-CM | POA: Diagnosis not present

## 2016-02-08 DIAGNOSIS — Z79899 Other long term (current) drug therapy: Secondary | ICD-10-CM | POA: Diagnosis not present

## 2016-02-08 NOTE — Anesthesia Postprocedure Evaluation (Signed)
Anesthesia Post Note  Patient: Jeffrey [name]  Procedure(s) Performed: Procedure(s) (LRB): REPAIR OF PSEUDO-ANEUREYSM OF LEFT ARM  ARTERIOVENOUS FISTULA (Left)  Patient location during evaluation: PACU Anesthesia Type: MAC Level of consciousness: awake Pain management: pain level controlled Vital Signs Assessment: post-procedure vital signs reviewed and stable Respiratory status: spontaneous breathing Cardiovascular status: stable Postop Assessment: no signs of nausea or vomiting Anesthetic complications: no    Last Vitals:  Filed Vitals:   02/07/16 1753 02/07/16 1801  BP: 170/83   Pulse: 64   Temp:  36.5 C  Resp: 19     Last Pain:  Filed Vitals:   02/07/16 1822  PainSc: 0-No pain                 Keni Wafer

## 2016-02-09 DIAGNOSIS — N186 End stage renal disease: Secondary | ICD-10-CM | POA: Diagnosis not present

## 2016-02-09 DIAGNOSIS — Z79899 Other long term (current) drug therapy: Secondary | ICD-10-CM | POA: Diagnosis not present

## 2016-02-09 DIAGNOSIS — D631 Anemia in chronic kidney disease: Secondary | ICD-10-CM | POA: Diagnosis not present

## 2016-02-09 DIAGNOSIS — D509 Iron deficiency anemia, unspecified: Secondary | ICD-10-CM | POA: Diagnosis not present

## 2016-02-11 ENCOUNTER — Encounter (HOSPITAL_COMMUNITY): Payer: Self-pay | Admitting: Vascular Surgery

## 2016-02-11 DIAGNOSIS — D631 Anemia in chronic kidney disease: Secondary | ICD-10-CM | POA: Diagnosis not present

## 2016-02-11 DIAGNOSIS — N186 End stage renal disease: Secondary | ICD-10-CM | POA: Diagnosis not present

## 2016-02-11 DIAGNOSIS — D509 Iron deficiency anemia, unspecified: Secondary | ICD-10-CM | POA: Diagnosis not present

## 2016-02-11 DIAGNOSIS — Z79899 Other long term (current) drug therapy: Secondary | ICD-10-CM | POA: Diagnosis not present

## 2016-02-12 ENCOUNTER — Ambulatory Visit: Payer: Medicare Other | Admitting: Vascular Surgery

## 2016-02-13 DIAGNOSIS — N186 End stage renal disease: Secondary | ICD-10-CM | POA: Diagnosis not present

## 2016-02-13 DIAGNOSIS — D509 Iron deficiency anemia, unspecified: Secondary | ICD-10-CM | POA: Diagnosis not present

## 2016-02-13 DIAGNOSIS — D631 Anemia in chronic kidney disease: Secondary | ICD-10-CM | POA: Diagnosis not present

## 2016-02-13 DIAGNOSIS — Z79899 Other long term (current) drug therapy: Secondary | ICD-10-CM | POA: Diagnosis not present

## 2016-02-16 DIAGNOSIS — D631 Anemia in chronic kidney disease: Secondary | ICD-10-CM | POA: Diagnosis not present

## 2016-02-16 DIAGNOSIS — Z79899 Other long term (current) drug therapy: Secondary | ICD-10-CM | POA: Diagnosis not present

## 2016-02-16 DIAGNOSIS — N186 End stage renal disease: Secondary | ICD-10-CM | POA: Diagnosis not present

## 2016-02-16 DIAGNOSIS — D509 Iron deficiency anemia, unspecified: Secondary | ICD-10-CM | POA: Diagnosis not present

## 2016-02-19 DIAGNOSIS — Z79899 Other long term (current) drug therapy: Secondary | ICD-10-CM | POA: Diagnosis not present

## 2016-02-19 DIAGNOSIS — D509 Iron deficiency anemia, unspecified: Secondary | ICD-10-CM | POA: Diagnosis not present

## 2016-02-19 DIAGNOSIS — N186 End stage renal disease: Secondary | ICD-10-CM | POA: Diagnosis not present

## 2016-02-19 DIAGNOSIS — Z992 Dependence on renal dialysis: Secondary | ICD-10-CM | POA: Diagnosis not present

## 2016-02-19 DIAGNOSIS — D631 Anemia in chronic kidney disease: Secondary | ICD-10-CM | POA: Diagnosis not present

## 2016-02-19 DIAGNOSIS — I120 Hypertensive chronic kidney disease with stage 5 chronic kidney disease or end stage renal disease: Secondary | ICD-10-CM | POA: Diagnosis not present

## 2016-02-22 DIAGNOSIS — N186 End stage renal disease: Secondary | ICD-10-CM | POA: Diagnosis not present

## 2016-02-22 DIAGNOSIS — D631 Anemia in chronic kidney disease: Secondary | ICD-10-CM | POA: Diagnosis not present

## 2016-02-22 DIAGNOSIS — D509 Iron deficiency anemia, unspecified: Secondary | ICD-10-CM | POA: Diagnosis not present

## 2016-02-22 DIAGNOSIS — Z79899 Other long term (current) drug therapy: Secondary | ICD-10-CM | POA: Diagnosis not present

## 2016-02-24 DIAGNOSIS — N186 End stage renal disease: Secondary | ICD-10-CM | POA: Diagnosis not present

## 2016-02-24 DIAGNOSIS — D631 Anemia in chronic kidney disease: Secondary | ICD-10-CM | POA: Diagnosis not present

## 2016-02-24 DIAGNOSIS — Z79899 Other long term (current) drug therapy: Secondary | ICD-10-CM | POA: Diagnosis not present

## 2016-02-24 DIAGNOSIS — D509 Iron deficiency anemia, unspecified: Secondary | ICD-10-CM | POA: Diagnosis not present

## 2016-02-26 DIAGNOSIS — N186 End stage renal disease: Secondary | ICD-10-CM | POA: Diagnosis not present

## 2016-02-26 DIAGNOSIS — D509 Iron deficiency anemia, unspecified: Secondary | ICD-10-CM | POA: Diagnosis not present

## 2016-02-26 DIAGNOSIS — Z79899 Other long term (current) drug therapy: Secondary | ICD-10-CM | POA: Diagnosis not present

## 2016-02-26 DIAGNOSIS — D631 Anemia in chronic kidney disease: Secondary | ICD-10-CM | POA: Diagnosis not present

## 2016-02-27 DIAGNOSIS — Z79899 Other long term (current) drug therapy: Secondary | ICD-10-CM | POA: Diagnosis not present

## 2016-02-27 DIAGNOSIS — D631 Anemia in chronic kidney disease: Secondary | ICD-10-CM | POA: Diagnosis not present

## 2016-02-27 DIAGNOSIS — M109 Gout, unspecified: Secondary | ICD-10-CM | POA: Diagnosis not present

## 2016-02-27 DIAGNOSIS — N186 End stage renal disease: Secondary | ICD-10-CM | POA: Diagnosis not present

## 2016-02-27 DIAGNOSIS — D509 Iron deficiency anemia, unspecified: Secondary | ICD-10-CM | POA: Diagnosis not present

## 2016-03-02 DIAGNOSIS — N186 End stage renal disease: Secondary | ICD-10-CM | POA: Diagnosis not present

## 2016-03-02 DIAGNOSIS — D509 Iron deficiency anemia, unspecified: Secondary | ICD-10-CM | POA: Diagnosis not present

## 2016-03-02 DIAGNOSIS — Z79899 Other long term (current) drug therapy: Secondary | ICD-10-CM | POA: Diagnosis not present

## 2016-03-02 DIAGNOSIS — D631 Anemia in chronic kidney disease: Secondary | ICD-10-CM | POA: Diagnosis not present

## 2016-03-03 DIAGNOSIS — D509 Iron deficiency anemia, unspecified: Secondary | ICD-10-CM | POA: Diagnosis not present

## 2016-03-03 DIAGNOSIS — D631 Anemia in chronic kidney disease: Secondary | ICD-10-CM | POA: Diagnosis not present

## 2016-03-03 DIAGNOSIS — Z79899 Other long term (current) drug therapy: Secondary | ICD-10-CM | POA: Diagnosis not present

## 2016-03-03 DIAGNOSIS — N186 End stage renal disease: Secondary | ICD-10-CM | POA: Diagnosis not present

## 2016-03-04 DIAGNOSIS — Z79899 Other long term (current) drug therapy: Secondary | ICD-10-CM | POA: Diagnosis not present

## 2016-03-04 DIAGNOSIS — D509 Iron deficiency anemia, unspecified: Secondary | ICD-10-CM | POA: Diagnosis not present

## 2016-03-04 DIAGNOSIS — N186 End stage renal disease: Secondary | ICD-10-CM | POA: Diagnosis not present

## 2016-03-04 DIAGNOSIS — D631 Anemia in chronic kidney disease: Secondary | ICD-10-CM | POA: Diagnosis not present

## 2016-03-06 DIAGNOSIS — Z79899 Other long term (current) drug therapy: Secondary | ICD-10-CM | POA: Diagnosis not present

## 2016-03-06 DIAGNOSIS — N186 End stage renal disease: Secondary | ICD-10-CM | POA: Diagnosis not present

## 2016-03-06 DIAGNOSIS — D631 Anemia in chronic kidney disease: Secondary | ICD-10-CM | POA: Diagnosis not present

## 2016-03-06 DIAGNOSIS — D509 Iron deficiency anemia, unspecified: Secondary | ICD-10-CM | POA: Diagnosis not present

## 2016-03-08 DIAGNOSIS — Z79899 Other long term (current) drug therapy: Secondary | ICD-10-CM | POA: Diagnosis not present

## 2016-03-08 DIAGNOSIS — D509 Iron deficiency anemia, unspecified: Secondary | ICD-10-CM | POA: Diagnosis not present

## 2016-03-08 DIAGNOSIS — N186 End stage renal disease: Secondary | ICD-10-CM | POA: Diagnosis not present

## 2016-03-08 DIAGNOSIS — D631 Anemia in chronic kidney disease: Secondary | ICD-10-CM | POA: Diagnosis not present

## 2016-03-11 DIAGNOSIS — Z79899 Other long term (current) drug therapy: Secondary | ICD-10-CM | POA: Diagnosis not present

## 2016-03-11 DIAGNOSIS — D631 Anemia in chronic kidney disease: Secondary | ICD-10-CM | POA: Diagnosis not present

## 2016-03-11 DIAGNOSIS — D509 Iron deficiency anemia, unspecified: Secondary | ICD-10-CM | POA: Diagnosis not present

## 2016-03-11 DIAGNOSIS — N186 End stage renal disease: Secondary | ICD-10-CM | POA: Diagnosis not present

## 2016-03-13 DIAGNOSIS — D631 Anemia in chronic kidney disease: Secondary | ICD-10-CM | POA: Diagnosis not present

## 2016-03-13 DIAGNOSIS — D509 Iron deficiency anemia, unspecified: Secondary | ICD-10-CM | POA: Diagnosis not present

## 2016-03-13 DIAGNOSIS — Z79899 Other long term (current) drug therapy: Secondary | ICD-10-CM | POA: Diagnosis not present

## 2016-03-13 DIAGNOSIS — N186 End stage renal disease: Secondary | ICD-10-CM | POA: Diagnosis not present

## 2016-03-14 DIAGNOSIS — D509 Iron deficiency anemia, unspecified: Secondary | ICD-10-CM | POA: Diagnosis not present

## 2016-03-14 DIAGNOSIS — N186 End stage renal disease: Secondary | ICD-10-CM | POA: Diagnosis not present

## 2016-03-14 DIAGNOSIS — Z79899 Other long term (current) drug therapy: Secondary | ICD-10-CM | POA: Diagnosis not present

## 2016-03-14 DIAGNOSIS — D631 Anemia in chronic kidney disease: Secondary | ICD-10-CM | POA: Diagnosis not present

## 2016-03-17 DIAGNOSIS — N186 End stage renal disease: Secondary | ICD-10-CM | POA: Diagnosis not present

## 2016-03-17 DIAGNOSIS — D631 Anemia in chronic kidney disease: Secondary | ICD-10-CM | POA: Diagnosis not present

## 2016-03-17 DIAGNOSIS — D509 Iron deficiency anemia, unspecified: Secondary | ICD-10-CM | POA: Diagnosis not present

## 2016-03-17 DIAGNOSIS — Z79899 Other long term (current) drug therapy: Secondary | ICD-10-CM | POA: Diagnosis not present

## 2016-03-20 DIAGNOSIS — D509 Iron deficiency anemia, unspecified: Secondary | ICD-10-CM | POA: Diagnosis not present

## 2016-03-20 DIAGNOSIS — Z992 Dependence on renal dialysis: Secondary | ICD-10-CM | POA: Diagnosis not present

## 2016-03-20 DIAGNOSIS — D631 Anemia in chronic kidney disease: Secondary | ICD-10-CM | POA: Diagnosis not present

## 2016-03-20 DIAGNOSIS — N186 End stage renal disease: Secondary | ICD-10-CM | POA: Diagnosis not present

## 2016-03-20 DIAGNOSIS — I120 Hypertensive chronic kidney disease with stage 5 chronic kidney disease or end stage renal disease: Secondary | ICD-10-CM | POA: Diagnosis not present

## 2016-03-20 DIAGNOSIS — Z79899 Other long term (current) drug therapy: Secondary | ICD-10-CM | POA: Diagnosis not present

## 2016-03-22 DIAGNOSIS — N2581 Secondary hyperparathyroidism of renal origin: Secondary | ICD-10-CM | POA: Diagnosis not present

## 2016-03-22 DIAGNOSIS — E8779 Other fluid overload: Secondary | ICD-10-CM | POA: Diagnosis not present

## 2016-03-22 DIAGNOSIS — D509 Iron deficiency anemia, unspecified: Secondary | ICD-10-CM | POA: Diagnosis not present

## 2016-03-22 DIAGNOSIS — N186 End stage renal disease: Secondary | ICD-10-CM | POA: Diagnosis not present

## 2016-03-22 DIAGNOSIS — D631 Anemia in chronic kidney disease: Secondary | ICD-10-CM | POA: Diagnosis not present

## 2016-03-23 MED FILL — ETHYL CHLORIDE SPRAY: 30 days supply | Qty: 104 | Fill #4

## 2016-03-25 DIAGNOSIS — D509 Iron deficiency anemia, unspecified: Secondary | ICD-10-CM | POA: Diagnosis not present

## 2016-03-25 DIAGNOSIS — N186 End stage renal disease: Secondary | ICD-10-CM | POA: Diagnosis not present

## 2016-03-25 DIAGNOSIS — N2581 Secondary hyperparathyroidism of renal origin: Secondary | ICD-10-CM | POA: Diagnosis not present

## 2016-03-25 DIAGNOSIS — E8779 Other fluid overload: Secondary | ICD-10-CM | POA: Diagnosis not present

## 2016-03-25 DIAGNOSIS — D631 Anemia in chronic kidney disease: Secondary | ICD-10-CM | POA: Diagnosis not present

## 2016-03-27 DIAGNOSIS — D631 Anemia in chronic kidney disease: Secondary | ICD-10-CM | POA: Diagnosis not present

## 2016-03-27 DIAGNOSIS — N2581 Secondary hyperparathyroidism of renal origin: Secondary | ICD-10-CM | POA: Diagnosis not present

## 2016-03-27 DIAGNOSIS — N186 End stage renal disease: Secondary | ICD-10-CM | POA: Diagnosis not present

## 2016-03-27 DIAGNOSIS — D509 Iron deficiency anemia, unspecified: Secondary | ICD-10-CM | POA: Diagnosis not present

## 2016-03-27 DIAGNOSIS — E8779 Other fluid overload: Secondary | ICD-10-CM | POA: Diagnosis not present

## 2016-03-29 DIAGNOSIS — D631 Anemia in chronic kidney disease: Secondary | ICD-10-CM | POA: Diagnosis not present

## 2016-03-29 DIAGNOSIS — N2581 Secondary hyperparathyroidism of renal origin: Secondary | ICD-10-CM | POA: Diagnosis not present

## 2016-03-29 DIAGNOSIS — E8779 Other fluid overload: Secondary | ICD-10-CM | POA: Diagnosis not present

## 2016-03-29 DIAGNOSIS — N186 End stage renal disease: Secondary | ICD-10-CM | POA: Diagnosis not present

## 2016-03-29 DIAGNOSIS — D509 Iron deficiency anemia, unspecified: Secondary | ICD-10-CM | POA: Diagnosis not present

## 2016-03-31 DIAGNOSIS — E8779 Other fluid overload: Secondary | ICD-10-CM | POA: Diagnosis not present

## 2016-03-31 DIAGNOSIS — N2581 Secondary hyperparathyroidism of renal origin: Secondary | ICD-10-CM | POA: Diagnosis not present

## 2016-03-31 DIAGNOSIS — D509 Iron deficiency anemia, unspecified: Secondary | ICD-10-CM | POA: Diagnosis not present

## 2016-03-31 DIAGNOSIS — N186 End stage renal disease: Secondary | ICD-10-CM | POA: Diagnosis not present

## 2016-03-31 DIAGNOSIS — D631 Anemia in chronic kidney disease: Secondary | ICD-10-CM | POA: Diagnosis not present

## 2016-04-02 DIAGNOSIS — D509 Iron deficiency anemia, unspecified: Secondary | ICD-10-CM | POA: Diagnosis not present

## 2016-04-02 DIAGNOSIS — M109 Gout, unspecified: Secondary | ICD-10-CM | POA: Diagnosis not present

## 2016-04-02 DIAGNOSIS — N186 End stage renal disease: Secondary | ICD-10-CM | POA: Diagnosis not present

## 2016-04-02 DIAGNOSIS — D631 Anemia in chronic kidney disease: Secondary | ICD-10-CM | POA: Diagnosis not present

## 2016-04-02 DIAGNOSIS — E785 Hyperlipidemia, unspecified: Secondary | ICD-10-CM | POA: Diagnosis not present

## 2016-04-02 DIAGNOSIS — N2581 Secondary hyperparathyroidism of renal origin: Secondary | ICD-10-CM | POA: Diagnosis not present

## 2016-04-02 DIAGNOSIS — E8779 Other fluid overload: Secondary | ICD-10-CM | POA: Diagnosis not present

## 2016-04-03 DIAGNOSIS — D509 Iron deficiency anemia, unspecified: Secondary | ICD-10-CM | POA: Diagnosis not present

## 2016-04-03 DIAGNOSIS — N2581 Secondary hyperparathyroidism of renal origin: Secondary | ICD-10-CM | POA: Diagnosis not present

## 2016-04-03 DIAGNOSIS — N186 End stage renal disease: Secondary | ICD-10-CM | POA: Diagnosis not present

## 2016-04-03 DIAGNOSIS — E8779 Other fluid overload: Secondary | ICD-10-CM | POA: Diagnosis not present

## 2016-04-03 DIAGNOSIS — D631 Anemia in chronic kidney disease: Secondary | ICD-10-CM | POA: Diagnosis not present

## 2016-04-06 ENCOUNTER — Other Ambulatory Visit: Payer: Medicare Other

## 2016-04-06 DIAGNOSIS — E8779 Other fluid overload: Secondary | ICD-10-CM | POA: Diagnosis not present

## 2016-04-06 DIAGNOSIS — D509 Iron deficiency anemia, unspecified: Secondary | ICD-10-CM | POA: Diagnosis not present

## 2016-04-06 DIAGNOSIS — N2581 Secondary hyperparathyroidism of renal origin: Secondary | ICD-10-CM | POA: Diagnosis not present

## 2016-04-06 DIAGNOSIS — N186 End stage renal disease: Secondary | ICD-10-CM | POA: Diagnosis not present

## 2016-04-06 DIAGNOSIS — D631 Anemia in chronic kidney disease: Secondary | ICD-10-CM | POA: Diagnosis not present

## 2016-04-07 ENCOUNTER — Other Ambulatory Visit: Payer: Medicare Other

## 2016-04-07 DIAGNOSIS — D509 Iron deficiency anemia, unspecified: Secondary | ICD-10-CM | POA: Diagnosis not present

## 2016-04-07 DIAGNOSIS — D631 Anemia in chronic kidney disease: Secondary | ICD-10-CM | POA: Diagnosis not present

## 2016-04-07 DIAGNOSIS — B1911 Unspecified viral hepatitis B with hepatic coma: Secondary | ICD-10-CM | POA: Diagnosis not present

## 2016-04-07 DIAGNOSIS — Z79899 Other long term (current) drug therapy: Secondary | ICD-10-CM

## 2016-04-07 DIAGNOSIS — N2581 Secondary hyperparathyroidism of renal origin: Secondary | ICD-10-CM | POA: Diagnosis not present

## 2016-04-07 DIAGNOSIS — B20 Human immunodeficiency virus [HIV] disease: Secondary | ICD-10-CM | POA: Diagnosis not present

## 2016-04-07 DIAGNOSIS — B181 Chronic viral hepatitis B without delta-agent: Secondary | ICD-10-CM

## 2016-04-07 DIAGNOSIS — E8779 Other fluid overload: Secondary | ICD-10-CM | POA: Diagnosis not present

## 2016-04-07 DIAGNOSIS — Z113 Encounter for screening for infections with a predominantly sexual mode of transmission: Secondary | ICD-10-CM

## 2016-04-07 DIAGNOSIS — N186 End stage renal disease: Secondary | ICD-10-CM | POA: Diagnosis not present

## 2016-04-08 ENCOUNTER — Encounter (HOSPITAL_COMMUNITY): Payer: Self-pay

## 2016-04-08 ENCOUNTER — Telehealth: Payer: Self-pay | Admitting: *Deleted

## 2016-04-08 ENCOUNTER — Encounter: Payer: Self-pay | Admitting: Gastroenterology

## 2016-04-08 ENCOUNTER — Observation Stay (HOSPITAL_COMMUNITY): Payer: Medicare Other

## 2016-04-08 ENCOUNTER — Observation Stay (HOSPITAL_COMMUNITY)
Admission: EM | Admit: 2016-04-08 | Discharge: 2016-04-10 | Disposition: A | Discharge status: Home / Self Care | Payer: Medicare Other | Attending: Internal Medicine | Admitting: Internal Medicine

## 2016-04-08 DIAGNOSIS — J449 Chronic obstructive pulmonary disease, unspecified: Secondary | ICD-10-CM | POA: Diagnosis not present

## 2016-04-08 DIAGNOSIS — I120 Hypertensive chronic kidney disease with stage 5 chronic kidney disease or end stage renal disease: Secondary | ICD-10-CM | POA: Insufficient documentation

## 2016-04-08 DIAGNOSIS — N186 End stage renal disease: Secondary | ICD-10-CM | POA: Diagnosis not present

## 2016-04-08 DIAGNOSIS — Z87891 Personal history of nicotine dependence: Secondary | ICD-10-CM | POA: Diagnosis not present

## 2016-04-08 DIAGNOSIS — Y832 Surgical operation with anastomosis, bypass or graft as the cause of abnormal reaction of the patient, or of later complication, without mention of misadventure at the time of the procedure: Secondary | ICD-10-CM | POA: Insufficient documentation

## 2016-04-08 DIAGNOSIS — T82898A Other specified complication of vascular prosthetic devices, implants and grafts, initial encounter: Principal | ICD-10-CM | POA: Insufficient documentation

## 2016-04-08 DIAGNOSIS — Z880 Allergy status to penicillin: Secondary | ICD-10-CM | POA: Insufficient documentation

## 2016-04-08 DIAGNOSIS — Z992 Dependence on renal dialysis: Secondary | ICD-10-CM | POA: Insufficient documentation

## 2016-04-08 DIAGNOSIS — R0602 Shortness of breath: Secondary | ICD-10-CM

## 2016-04-08 DIAGNOSIS — E0590 Thyrotoxicosis, unspecified without thyrotoxic crisis or storm: Secondary | ICD-10-CM | POA: Diagnosis not present

## 2016-04-08 DIAGNOSIS — Z21 Asymptomatic human immunodeficiency virus [HIV] infection status: Secondary | ICD-10-CM | POA: Diagnosis not present

## 2016-04-08 DIAGNOSIS — D638 Anemia in other chronic diseases classified elsewhere: Secondary | ICD-10-CM | POA: Diagnosis present

## 2016-04-08 DIAGNOSIS — D649 Anemia, unspecified: Secondary | ICD-10-CM | POA: Diagnosis not present

## 2016-04-08 DIAGNOSIS — I10 Essential (primary) hypertension: Secondary | ICD-10-CM | POA: Insufficient documentation

## 2016-04-08 DIAGNOSIS — D631 Anemia in chronic kidney disease: Secondary | ICD-10-CM | POA: Diagnosis not present

## 2016-04-08 DIAGNOSIS — E785 Hyperlipidemia, unspecified: Secondary | ICD-10-CM | POA: Diagnosis not present

## 2016-04-08 LAB — COMPREHENSIVE METABOLIC PANEL
ALBUMIN: 3.8 g/dL (ref 3.5–5.0)
ALK PHOS: 43 U/L (ref 40–115)
ALK PHOS: 44 U/L (ref 38–126)
ALT: 6 U/L — AB (ref 9–46)
ALT: 10 U/L — AB (ref 17–63)
AST: 14 U/L (ref 10–35)
AST: 16 U/L (ref 15–41)
Albumin: 3.9 g/dL (ref 3.6–5.1)
Anion gap: 13 (ref 5–15)
BILIRUBIN TOTAL: 0.4 mg/dL (ref 0.3–1.2)
BUN: 90 mg/dL — AB (ref 7–25)
BUN: 106 mg/dL — AB (ref 6–20)
CO2: 27 mmol/L (ref 20–31)
CO2: 22 mmol/L (ref 22–32)
CREATININE: 17.44 mg/dL — AB (ref 0.61–1.24)
Calcium: 8.7 mg/dL (ref 8.6–10.3)
Calcium: 9.2 mg/dL (ref 8.9–10.3)
Chloride: 99 mmol/L (ref 98–110)
Chloride: 101 mmol/L (ref 101–111)
Creat: 14.21 mg/dL — ABNORMAL HIGH (ref 0.70–1.25)
GFR calc Af Amer: 3 mL/min — ABNORMAL LOW (ref >60)
GFR, EST NON AFRICAN AMERICAN: 3 mL/min — AB (ref >60)
GLUCOSE: 106 mg/dL — AB (ref 65–99)
GLUCOSE: 104 mg/dL — AB (ref 65–99)
POTASSIUM: 4.5 mmol/L (ref 3.5–5.3)
Potassium: 4.6 mmol/L (ref 3.5–5.1)
Sodium: 137 mmol/L (ref 135–146)
Sodium: 136 mmol/L (ref 135–145)
TOTAL PROTEIN: 7.4 g/dL (ref 6.5–8.1)
Total Bilirubin: 0.5 mg/dL (ref 0.2–1.2)
Total Protein: 6.9 g/dL (ref 6.1–8.1)

## 2016-04-08 LAB — RPR

## 2016-04-08 LAB — CBC
HCT: 19.8 % — ABNORMAL LOW (ref 38.5–50.0)
HCT: 18.3 % — ABNORMAL LOW (ref 39.0–52.0)
HEMOGLOBIN: 6.5 g/dL — AB (ref 13.2–17.1)
Hemoglobin: 6.1 g/dL — CL (ref 13.0–17.0)
MCH: 28.4 pg (ref 27.0–33.0)
MCH: 28.4 pg (ref 26.0–34.0)
MCHC: 33.8 g/dL (ref 32.0–36.0)
MCHC: 33.3 g/dL (ref 30.0–36.0)
MCV: 86.5 fL (ref 80.0–100.0)
MCV: 85.1 fL (ref 78.0–100.0)
MPV: 8.6 fL (ref 7.5–12.5)
PLATELETS: 105 10*3/uL — AB (ref 140–400)
PLATELETS: 117 10*3/uL — AB (ref 150–400)
RBC: 2.29 MIL/uL — ABNORMAL LOW (ref 4.20–5.80)
RBC: 2.15 MIL/uL — ABNORMAL LOW (ref 4.22–5.81)
RDW: 17.2 % — ABNORMAL HIGH (ref 11.0–15.0)
RDW: 15.6 % — AB (ref 11.5–15.5)
WBC: 5.4 10*3/uL (ref 3.8–10.8)
WBC: 6.3 10*3/uL (ref 4.0–10.5)

## 2016-04-08 LAB — LIPID PANEL
CHOLESTEROL: 73 mg/dL — AB (ref 125–200)
HDL: 27 mg/dL — ABNORMAL LOW (ref >=40)
LDL Cholesterol: 33 mg/dL (ref <130)
TRIGLYCERIDES: 64 mg/dL (ref <150)
Total CHOL/HDL Ratio: 2.7 Ratio (ref <=5.0)
VLDL: 13 mg/dL (ref <30)

## 2016-04-08 LAB — T-HELPER CELL (CD4) - (RCID CLINIC ONLY)
CD4 % Helper T Cell: 28 % — ABNORMAL LOW (ref 33–55)
CD4 T Cell Abs: 280 /uL — ABNORMAL LOW (ref 400–2700)

## 2016-04-08 LAB — PREPARE RBC (CROSSMATCH)

## 2016-04-08 MED ORDER — TENOFOVIR DISOPROXIL FUMARATE 300 MG PO TABS: 300.0000 mg | ORAL_TABLET | ORAL | Status: DC

## 2016-04-08 MED ORDER — HEPARIN SODIUM (PORCINE) 5000 UNIT/ML IJ SOLN
5000.0000 [IU] | Freq: Three times a day (TID) | INTRAMUSCULAR | Status: DC
Start: 2016-04-08 — End: 2016-04-10
  Administered 2016-04-08 – 2016-04-10 (×2): 5000 [IU] via SUBCUTANEOUS
  Filled 2016-04-08 (×5): qty 1

## 2016-04-08 MED ORDER — AMLODIPINE BESYLATE 5 MG PO TABS
5.0000 mg | ORAL_TABLET | Freq: Two times a day (BID) | ORAL | Status: DC
  Administered 2016-04-08 – 2016-04-09 (×4): 5 mg via ORAL
  Filled 2016-04-08 (×4): qty 1

## 2016-04-08 MED ORDER — CALCIUM ACETATE (PHOS BINDER) 667 MG PO CAPS
2001.0000 mg | ORAL_CAPSULE | Freq: Every day | ORAL | Status: DC
  Administered 2016-04-09 – 2016-04-10 (×2): 2001 mg via ORAL
  Filled 2016-04-08 (×2): qty 3

## 2016-04-08 MED ORDER — CALCIUM ACETATE (PHOS BINDER) 667 MG PO CAPS
1334.0000 mg | ORAL_CAPSULE | Freq: Every day | ORAL | Status: DC
Start: 2016-04-09 — End: 2016-04-10
  Administered 2016-04-09: 1334 mg via ORAL
  Filled 2016-04-08 (×2): qty 2

## 2016-04-08 MED ORDER — RALTEGRAVIR POTASSIUM 400 MG PO TABS
400.0000 mg | ORAL_TABLET | Freq: Two times a day (BID) | ORAL | Status: DC
  Administered 2016-04-08 – 2016-04-09 (×3): 400 mg via ORAL
  Filled 2016-04-08 (×5): qty 1

## 2016-04-08 MED ORDER — MOMETASONE FURO-FORMOTEROL FUM 200-5 MCG/ACT IN AERO: 2.0000 | INHALATION_SPRAY | Freq: Two times a day (BID) | RESPIRATORY_TRACT | Status: DC

## 2016-04-08 MED ORDER — TRAZODONE HCL 50 MG PO TABS
50.0000 mg | ORAL_TABLET | Freq: Every evening | ORAL | Status: DC | PRN
  Filled 2016-04-08: qty 1

## 2016-04-08 MED ORDER — CALCIUM ACETATE (PHOS BINDER) 667 MG PO CAPS: 1334.0000 mg | ORAL_CAPSULE | Freq: Three times a day (TID) | ORAL | Status: DC

## 2016-04-08 MED ORDER — CARVEDILOL 25 MG PO TABS
25.0000 mg | ORAL_TABLET | Freq: Two times a day (BID) | ORAL | Status: DC
  Administered 2016-04-09 – 2016-04-10 (×4): 25 mg via ORAL
  Filled 2016-04-08 (×4): qty 1

## 2016-04-08 MED ORDER — ALBUTEROL SULFATE (2.5 MG/3ML) 0.083% IN NEBU
2.5000 mg | INHALATION_SOLUTION | Freq: Four times a day (QID) | RESPIRATORY_TRACT | Status: DC | PRN
  Administered 2016-04-08 – 2016-04-09 (×3): 2.5 mg via RESPIRATORY_TRACT
  Filled 2016-04-08 (×3): qty 3

## 2016-04-08 MED ORDER — RENA-VITE PO TABS
1.0000 | ORAL_TABLET | Freq: Every day | ORAL | Status: DC
  Administered 2016-04-09: 1 via ORAL
  Filled 2016-04-08: qty 1

## 2016-04-08 MED ORDER — LAMIVUDINE 10 MG/ML PO SOLN
25.0000 mg | Freq: Every day | ORAL | Status: DC
  Administered 2016-04-08 – 2016-04-09 (×2): 25 mg via ORAL
  Filled 2016-04-08 (×3): qty 5

## 2016-04-08 MED ORDER — CALCITRIOL 0.25 MCG PO CAPS
0.2500 ug | ORAL_CAPSULE | Freq: Every day | ORAL | Status: DC
  Administered 2016-04-09: 0.25 ug via ORAL
  Filled 2016-04-08: qty 1

## 2016-04-08 MED ORDER — SODIUM CHLORIDE 0.9 % IV SOLN
Freq: Once | INTRAVENOUS | Status: AC
  Administered 2016-04-10: 09:00:00 via INTRAVENOUS

## 2016-04-08 MED ORDER — CLONIDINE HCL 0.1 MG PO TABS
0.1000 mg | ORAL_TABLET | Freq: Two times a day (BID) | ORAL | Status: DC
  Administered 2016-04-08 (×2): 0.1 mg via ORAL
  Filled 2016-04-08 (×3): qty 1

## 2016-04-08 MED ORDER — LAMIVUDINE 5 MG/ML PO SOLN
25.0000 mg | Freq: Every day | ORAL | Status: DC
  Filled 2016-04-08 (×8): qty 5

## 2016-04-08 NOTE — H&P (Signed)
Date: 04/08/2016               Patient Name:  Jeffrey [name] MRN: [id number]  DOB: [birth year] Age / Sex: 60 y.o., male   PCP: Rexene Agent, MD         Medical Service: Internal Medicine Teaching Service         Attending Physician: Dr. Oval Linsey, MD    First Contact: Dr. Heber Austell Pager: [phone]  Second Contact: Dr. Arcelia Jew Pager: [phone]       After Hours (After 5p/  First Contact Pager: [phone]  weekends / holidays): Second Contact Pager: [phone]   Chief Complaint: Shortness of Breath  History of Present Illness: [name] is a 60 yo male with PMHx of ESRD, HTN, anemia and COPD who presents to the ED per recommendation of his physician because recent lab work showed a hemoglobin of 6.3.  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  He is very cautious of maintaining his dry weight of around 92kg.  He has had vascular surgery in the past for spontaneous bleeding of the left radial cephalic AV fistula.  On Monday 7/17 during dialysis he had some bleeding from his fistula stating about 38ml of blood was lost.  He currently has a new site since then. He has recently noticed a progressing SOB in the past 2 weeks.  He has new onset orthopnea and states he can not walk across the street without getting short of breath.  He denies any NSAID use and has not seen any blood in his stool.   He has a history of COPD and prior to two weeks ago he rarely had to use his albuterol inhaler.  In the past 2 weeks patient has been using nebulizer treatments two times a day with some benefit. He denies any leg swelling or chest pain.     Meds: Current Facility-Administered Medications  Medication Dose Route Frequency Provider Last Rate Last Dose  . 0.9 %  sodium chloride infusion   Intravenous Once Daleen Bo, MD      . albuterol (PROVENTIL) (2.5 MG/3ML) 0.083% nebulizer solution 2.5 mg  2.5 mg  Nebulization Q6H PRN Carly J Rivet, MD      . amLODipine (NORVASC) tablet 5 mg  5 mg Oral BID Carly J Rivet, MD      . cloNIDine (CATAPRES) tablet 0.1 mg  0.1 mg Oral BID Juliet Rude, MD       Current Outpatient Prescriptions  Medication Sig Dispense Refill  . albuterol (PROVENTIL) (2.5 MG/3ML) 0.083% nebulizer solution Take 2.5 mg by nebulization every 6 (six) hours as needed for wheezing or shortness of breath.    Marland Kitchen amLODipine (NORVASC) 10 MG tablet Take 5 mg by mouth 2 (two) times daily.     . calcitRIOL (ROCALTROL) 0.25 MCG capsule Take 0.25 mcg by mouth daily.    . calcium acetate (PHOSLO) 667 MG capsule Take 1,334-2,001 mg by mouth 3 (three) times daily with meals. 3 caps with a meal, 2 caps with a snack    . carvedilol (COREG) 25 MG tablet Take 25 mg by mouth 2 (two) times daily with a meal.    . cloNIDine (CATAPRES) 0.1 MG tablet Take 0.1 mg by mouth 2 (two) times daily.   6  . EPIVIR HBV 5 MG/ML solution TAKE 5 ML BY  MOUTH ONCE DAILY 240 mL 3  . ethyl chloride spray Apply 1 application topically daily as needed. At HD  12  . ISENTRESS 400 MG tablet TAKE 1 TABLET(400 MG) BY MOUTH TWICE DAILY 60 tablet 5  . multivitamin (RENA-VIT) TABS tablet Take 1 tablet by mouth daily.      . traZODone (DESYREL) 50 MG tablet Take 50 mg by mouth at bedtime as needed for sleep.    Marland Kitchen oxyCODONE-acetaminophen (ROXICET) 5-325 MG tablet Take 1-2 tablets by mouth every 4 (four) hours as needed. (Patient not taking: Reported on 04/08/2016) 20 tablet 0  . tenofovir (VIREAD) 300 MG tablet Take 1 tablet (300 mg total) by mouth every Saturday. After dialysis on Saturday 5 tablet 5    Allergies: Allergies as of 04/08/2016 - Review Complete 04/08/2016  Allergen Reaction Noted  . Lisinopril Shortness Of Breath and Swelling 08/31/2012  . Penicillins Other (See Comments)    Past Medical History  Diagnosis Date  . Hypertension   . Gout   . Anemia   . Thrombocytopenia ([place])   . Anxiety   . Hepatitis B  carrier   . HIV infection ([place])   . Asthma     per pt hx  . End stage renal disease on home HD 07/10/2011    Started HD in September 2012 at [facility] with a tunneled HD catheter, now on home HD with NxtStage. Dialyzing through AVF L lower arm with buttonhole technique as of mid 2014. His brother does the HD treatments at home.  They are roommates for 23 years.  The brother works 3rd shift and gets off about 8am and then puts Mr Gile on HD in the morning after getting home. Most of the time he does HD about 4 times a week, for about 4 hours per treatment. Cause of ESRD was HTN according to patient. He says he let his health go and ending up with complications, and that he didn't like seeing doctors in those days.  He says he was diagnosed with severe HTN when he lived in [place] in his 60's.   . Pneumonia   . Hyperthyroidism   . Seizures ([place]) 06/02/2011    pt denies this  . Seizure ([place])   . Arthritis     Family History:  Family History  Problem Relation Age of Onset  . Hypertension Mother   . Cancer Father   . Diabetes Mother     Social History:  Social History   Social History  . Marital Status: Single    Spouse Name: N/A  . Number of Children: N/A  . Years of Education: N/A   Occupational History  . Not on file.   Social History Main Topics  . Smoking status: Former Smoker    Types: Cigarettes    Quit date: 08/08/2015  . Smokeless tobacco: Never Used  . Alcohol Use: No  . Drug Use: No     Comment: uds (+) cocaine in 08/2011 but pt states was taking sudafed at the time  . Sexual Activity:    Partners: Male   Other Topics Concern  . 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 Review of Systems  Constitutional: Negative for fever  and chills.  Respiratory: Negative for cough and wheezing.   Cardiovascular: Positive for orthopnea. Negative for chest pain and leg swelling.  Gastrointestinal: Negative for abdominal pain.  Neurological: Negative for dizziness.     Physical Exam: Blood pressure 192/95, pulse 79, temperature 98.4 F (36.9 C), temperature source Oral, resp. rate 12, SpO2 99 %. Physical Exam  Constitutional: He is oriented to person, place, and time.  HENT:  Head: Normocephalic and atraumatic.  Eyes: Pupils are equal, round, and reactive to light.  Cardiovascular: Normal rate, regular rhythm and normal heart sounds.   Pulmonary/Chest: Effort normal and breath sounds normal. No respiratory distress.  Abdominal: Soft. There is no tenderness.  Musculoskeletal: He exhibits no edema.  Neurological: He is alert and oriented to person, place, and time.  Skin: Skin is warm and dry.  Fistula in left arm with palpable thrill   BMET    Component Value Date/Time   NA 136 04/08/2016 1246   K 4.6 04/08/2016 1246   CL 101 04/08/2016 1246   CO2 22 04/08/2016 1246   GLUCOSE 104* 04/08/2016 1246   BUN 106* 04/08/2016 1246   CREATININE 17.44* 04/08/2016 1246   CREATININE 14.21* 04/07/2016 1435   CALCIUM 9.2 04/08/2016 1246   CALCIUM 4.3* 06/02/2011 1525   GFRNONAA 3* 04/08/2016 1246   GFRNONAA 5* 10/24/2013 0909   GFRAA 3* 04/08/2016 1246   GFRAA 6* 10/24/2013 0909    CBC    Component Value Date/Time   WBC 6.3 04/08/2016 1246   RBC 2.15* 04/08/2016 1246   HGB 6.1* 04/08/2016 1246   HCT 18.3* 04/08/2016 1246   PLT 117* 04/08/2016 1246   MCV 85.1 04/08/2016 1246   MCH 28.4 04/08/2016 1246   MCHC 33.3 04/08/2016 1246   RDW 15.6* 04/08/2016 1246   LYMPHSABS 1.4 06/07/2015 1850   MONOABS 0.6 06/07/2015 1850   EOSABS 0.5 06/07/2015 1850   BASOSABS 0.0 06/07/2015 1850     CXR: FINDINGS:  Lungs are hyperexpanded. Interstitial markings are diffusely coarsened with chronic features. Basilar  interstitial and alveolar opacity is slightly asymmetric, right greater than left. Cardiopericardial silhouette is at upper limits of normal for size. The visualized bony structures of the thorax are intact.  Assessment & Plan by Problem: Symptomatic anemia  Current Hemoglobin of 6.1, baseline is from 9-10.  Patient has been complaining of progressing SOB over the past 2 weeks. Denies NSAID use, dark tarry stools or bright red blood in stool.  Fistula had some bleeding on Monday around 18ml.  MCV 85.1.  In history of low Ferritin last level 316 in 2014 History of anemia is likely due to his chronic kidney disease  - 1 unit of blood  ESRD Patient has home dialysis 3-4x a week.  Schedule is sporadic.  May need dialysis in hospital if stays tomorrow.  Current K is 4.6, anion gap 13.  Patient has mild left lower long base crackles.  No lower extremity edema.  No emergent need for dialysis at this time.   - Consult nephrology for hemodialysis  Orthopnea Two weeks ago patient noticed he would wake up short of breath and has had to sleep at an angle to help with SOB.  Chest X ray compared to previous imaging shows cardiomegaly. -Echo  COPD Prior to 2 weeks ago patient has not had to use his albuterol inhaler except as needed once a week.  He currently is using breathing treatments two times a day. - continue home  albuterol nebs - has no PFTs may benefit outpatient testing  HIV Last CD4 count is 280 on 7/18.  Last viral load is (28) 5 months ago. - Resuming home meds  HTN Has been hypertensive.  Patient is on clonidine and has not had his morning dose.  Current high blood pressure is likely due to rebound HTN. -Resuming home meds amlodipine and clonidine   Dispo: Admit patient to Observation with expected length of stay less than 2 midnights.  Signed: Valinda Party, DO 04/08/2016, 3:19 PM  Pager: [phone]

## 2016-04-08 NOTE — ED Notes (Addendum)
Patient was instructed to come to ED because his hemoglobin is 6.3. Last dialysis on Monday.  States that he feels short of breath with same. NAD and speaking full sentences on arrival. Denies CP.

## 2016-04-08 NOTE — Telephone Encounter (Signed)
Thank you for your prompt response.

## 2016-04-08 NOTE — ED Notes (Signed)
MD at bedside. 

## 2016-04-08 NOTE — Consult Note (Signed)
ACCESS EVALUATION  History of Present Illness  Jeffrey [name] is a 60 y.o. year old male who presents with repeat bleeding from L RC AVF.  This patient underwent repair of PSA L RC AVF with Dr. Scot Dock on 02/07/16.  The patient is now bleeding from his other button hole site.  The patient notes 3 significant bleeding episodes since his prior procedure.  This patient was admitted to the hospital today for sx anemia.  Past Medical History  Diagnosis Date  . Hypertension   . Gout   . Anemia   . Thrombocytopenia ([place])   . Anxiety   . Hepatitis B carrier   . HIV infection ([place])   . Asthma     per pt hx  . End stage renal disease on home HD 07/10/2011    Started HD in September 2012 at [facility] with a tunneled HD catheter, now on home HD with NxtStage. Dialyzing through AVF L lower arm with buttonhole technique as of mid 2014. His brother does the HD treatments at home.  They are roommates for 23 years.  The brother works 3rd shift and gets off about 8am and then puts Jeffrey [name] on HD in the morning after getting home. Most of the time he does HD about 4 times a week, for about 4 hours per treatment. Cause of ESRD was HTN according to patient. He says he let his health go and ending up with complications, and that he didn't like seeing doctors in those days.  He says he was diagnosed with severe HTN when he lived in [place] in his 26's.   . Pneumonia   . Hyperthyroidism   . Seizures ([place]) 06/02/2011    pt denies this  . Seizure ([place])   . Arthritis     Past Surgical History  Procedure Laterality Date  . Av fistula placement  06/02/11    Left radiocephalic AVF  . Other surgical history      removal temporary HD catheter   . Colonoscopy    . Revison of arteriovenous fistula Left 10/10/2015    Procedure: REVISON OF LEFT RADIOCEPHALIC ARTERIOVENOUS FISTULA;  Surgeon: Angelia Mould, MD;  Location: [place];  Service: Vascular;  Laterality: Left;  . Revison of arteriovenous fistula Left  02/07/2016    Procedure: REPAIR OF PSEUDO-ANEUREYSM OF LEFT ARM  ARTERIOVENOUS FISTULA;  Surgeon: Angelia Mould, MD;  Location: [place];  Service: Vascular;  Laterality: Left;    Social History   Social History  . Marital Status: Single    Spouse Name: N/A  . Number of Children: N/A  . Years of Education: N/A   Occupational History  . Not on file.   Social History Main Topics  . Smoking status: Former Smoker    Types: Cigarettes    Quit date: 08/08/2015  . Smokeless tobacco: Never Used  . Alcohol Use: No  . Drug Use: No     Comment: uds (+) cocaine in 08/2011 but pt states was taking sudafed at the time  . Sexual Activity:    Partners: Male   Other Topics Concern  . 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 Hypertension Mother   .  Cancer Father   . Diabetes Mother     Current Facility-Administered Medications  Medication Dose Route Frequency Provider Last Rate Last Dose  . 0.9 %  sodium chloride infusion   Intravenous Once Daleen Bo, MD      . albuterol (PROVENTIL) (2.5 MG/3ML) 0.083% nebulizer solution 2.5 mg  2.5 mg Nebulization Q6H PRN Juliet Rude, MD   2.5 mg at 04/08/16 1536  . amLODipine (NORVASC) tablet 5 mg  5 mg Oral BID Juliet Rude, MD   5 mg at 04/08/16 1536  . [START ON 04/09/2016] calcitRIOL (ROCALTROL) capsule 0.25 mcg  0.25 mcg Oral Daily Juliet Rude, MD      . Derrill Memo ON 04/09/2016] calcium acetate (PHOSLO) capsule 1,334 mg  1,334 mg Oral Q lunch Oval Linsey, MD      . calcium acetate (PHOSLO) capsule 2,001 mg  2,001 mg Oral Q supper Oval Linsey, MD      . Derrill Memo ON 04/09/2016] carvedilol (COREG) tablet 25 mg  25 mg Oral BID WC Carly J Rivet, MD      . cloNIDine (CATAPRES) tablet 0.1 mg  0.1 mg Oral BID Juliet Rude,  MD   0.1 mg at 04/08/16 1536  . heparin injection 5,000 Units  5,000 Units Subcutaneous Q8H Carly J Rivet, MD      . lamiVUDine (EPIVIR) 10 MG/ML solution 25 mg  25 mg Oral Daily Oval Linsey, MD      . multivitamin (RENA-VIT) tablet 1 tablet  1 tablet Oral Daily Carly J Rivet, MD      . raltegravir (ISENTRESS) tablet 400 mg  400 mg Oral BID Juliet Rude, MD      . Derrill Memo ON 04/11/2016] tenofovir (VIREAD) tablet 300 mg  300 mg Oral Q Sat Carly J Rivet, MD      . traZODone (DESYREL) tablet 50 mg  50 mg Oral QHS PRN Juliet Rude, MD         Allergies  Allergen Reactions  . Lisinopril Shortness Of Breath and Swelling    Throat swelling  . Penicillins Other (See Comments)    Childhood allergy Has patient had a PCN reaction causing immediate rash, facial/tongue/throat swelling, SOB or lightheadedness with hypotension: Unknown Has patient had a PCN reaction causing severe rash involving mucus membranes or skin necrosis: Unknown Has patient had a PCN reaction that required hospitalization: Unknown Has patient had a PCN reaction occurring within the last 10 years: Unknown If all of the above answers are "NO", then may proceed with Cephalosporin use.      REVIEW OF SYSTEMS:  (Positives checked otherwise negative)  CARDIOVASCULAR:   [ ]  chest pain,  [ ]  chest pressure,  [ ]  palpitations,  [ ]  shortness of breath when laying flat,  [x]  shortness of breath with exertion,   [ ]  pain in feet when walking,  [ ]  pain in feet when laying flat, [ ]  history of blood clot in veins (DVT),  [ ]  history of phlebitis,  [ ]  swelling in legs,  [ ]  varicose veins  PULMONARY:   [ ]  productive cough,  [ ]  asthma,  [ ]  wheezing  NEUROLOGIC:   [ ]  weakness in arms or legs,  [ ]  numbness in arms or legs,  [ ]  difficulty speaking or slurred speech,  [ ]  temporary loss of vision in one eye,  [ ]  dizziness  HEMATOLOGIC:   [x]  bleeding from access,  [ ]  problems with blood clotting too  easily  MUSCULOSKEL:   [ ]  joint pain, [ ]  joint swelling  GASTROINTEST:   [ ]  vomiting blood,  [ ]  blood in stool     GENITOURINARY:   [ ]  burning with urination,  [ ]  blood in urine  PSYCHIATRIC:   [ ]  history of major depression  INTEGUMENTARY:   [ ]  rashes,  [ ]  ulcers  CONSTITUTIONAL:   [ ]  fever,  [ ]  chills   Physical Examination Filed Vitals:   04/08/16 1731 04/08/16 1814  BP: 161/76 182/86  Pulse:  74  Temp:  98.5 F (36.9 C)  Resp:  16   Pulmonary: Sym exp, good air movt, CTAB, no rales, rhonchi, & wheezing  Cardiac: RRR, Nl S1, S2, no Murmurs, rubs or gallops  LUE: Incision in L arm are healed, skin feels warm, hand grip is 5/5, sensation in digits is intact, palpable thrill, strong bruit can be auscultated, no bleeding from distal buttonhole, proximal button has verrucous lesion adjacent to the buttonhole with segment of attenuated skin proximal to this lesion concerning for possible future bleeding  Medical Decision Making  Jeffrey [name] is a 60 y.o. year old male who presents s/p recent repair of buttonhole PSA L RC AVF, concerning proximal PSA adjacent to proximal buttonhole   Given pt notes already 3 separate episodes with difficulty controlling bleeding, should consider repair of the most proximal buttonhole with associated PSA.  Will see if patient can be scheduled for Friday but OR schedule may push him into next week.  Thank you for allowing [name] to participate in this patient's care.  Adele Barthel, MD, FACS [facility] Office: [phone] Pager: [phone]

## 2016-04-08 NOTE — Telephone Encounter (Signed)
-----   Message from Campbell Riches, MD sent at 04/08/2016  8:56 AM EDT ----- Pt needs a repeat CBC emergently  ----- Message -----    From: Lab in [facility] Interface    Sent: 04/08/2016   3:56 AM      To: Campbell Riches, MD

## 2016-04-08 NOTE — ED Provider Notes (Signed)
CSN: [id number]     Arrival date & time 04/08/16  1228 History   First MD Initiated Contact with Patient 04/08/16 1335     Chief Complaint  Patient presents with  . low hemoglobin/blood transfusion      (Consider location/radiation/quality/duration/timing/severity/associated sxs/prior Treatment) HPI   Jeffrey [name] is a 60 y.o. male here for evaluation of anemia. He had routine labs tested yesterday showing hemoglobin low. His physician told him to come here for treatment. They reported that he needed a transfusion. He is on home hemodialysis. He receives medication to stimulate red blood cell formation. No known loss of blood such as vomiting or stooling, with suspected blood. No recent trauma. He denies fever, chills, nausea, vomiting. He has mild shortness of breath, which is not unusual. Last dialysis, was 3 days ago. At that time a new portion of the fistula was used, and he began to have bleeding from it, following the treatment. The patient's caregiver, who administers the dialysis treatments, feels like the area is more "puffy" since that treatment. There are no other known modifying factors.   Past Medical History  Diagnosis Date  . Hypertension   . Gout   . Anemia   . Thrombocytopenia ([place])   . Anxiety   . Hepatitis B carrier   . HIV infection ([place])   . Asthma     per pt hx  . End stage renal disease on home HD 07/10/2011    Started HD in September 2012 at [facility] with a tunneled HD catheter, now on home HD with NxtStage. Dialyzing through AVF L lower arm with buttonhole technique as of mid 2014. His brother does the HD treatments at home.  They are roommates for 23 years.  The brother works 3rd shift and gets off about 8am and then puts Jeffrey [name] on HD in the morning after getting home. Most of the time he does HD about 4 times a week, for about 4 hours per treatment. Cause of ESRD was HTN according to patient. He says he let his health go and ending up with complications, and that  he didn't like seeing doctors in those days.  He says he was diagnosed with severe HTN when he lived in [place] in his 57's.   . Pneumonia   . Hyperthyroidism   . Seizures ([place]) 06/02/2011    pt denies this  . Seizure ([place])   . Arthritis    Past Surgical History  Procedure Laterality Date  . Av fistula placement  06/02/11    Left radiocephalic AVF  . Other surgical history      removal temporary HD catheter   . Colonoscopy    . Revison of arteriovenous fistula Left 10/10/2015    Procedure: REVISON OF LEFT RADIOCEPHALIC ARTERIOVENOUS FISTULA;  Surgeon: Angelia Mould, MD;  Location: [place];  Service: Vascular;  Laterality: Left;  . Revison of arteriovenous fistula Left 02/07/2016    Procedure: REPAIR OF PSEUDO-ANEUREYSM OF LEFT ARM  ARTERIOVENOUS FISTULA;  Surgeon: Angelia Mould, MD;  Location: [facility] OR;  Service: Vascular;  Laterality: Left;   Family History  Problem Relation Age of Onset  . Hypertension Mother   . Cancer Father   . Diabetes Mother    Social History  Substance Use Topics  . Smoking status: Former Smoker    Types: Cigarettes    Quit date: 08/08/2015  . Smokeless tobacco: Never Used  . Alcohol Use: No    Review of Systems  All  other systems reviewed and are negative.     Allergies  Lisinopril and Penicillins  Home Medications   Prior to Admission medications   Medication Sig Start Date End Date Taking? Authorizing Provider  albuterol (PROVENTIL) (2.5 MG/3ML) 0.083% nebulizer solution Take 2.5 mg by nebulization every 6 (six) hours as needed for wheezing or shortness of breath.   Yes Historical Provider, MD  amLODipine (NORVASC) 10 MG tablet Take 5 mg by mouth 2 (two) times daily.    Yes Historical Provider, MD  calcitRIOL (ROCALTROL) 0.25 MCG capsule Take 0.25 mcg by mouth daily.   Yes Historical Provider, MD  calcium acetate (PHOSLO) 667 MG capsule Take 1,334-2,001 mg by mouth 3 (three) times daily with meals. 3 caps with a meal, 2 caps  with a snack 06/06/15  Yes Historical Provider, MD  carvedilol (COREG) 25 MG tablet Take 25 mg by mouth 2 (two) times daily with a meal.   Yes Historical Provider, MD  cloNIDine (CATAPRES) 0.1 MG tablet Take 0.1 mg by mouth 2 (two) times daily.  06/10/15  Yes Historical Provider, MD  EPIVIR HBV 5 MG/ML solution TAKE 5 ML BY MOUTH ONCE DAILY 10/01/15  Yes Thayer Headings, MD  ethyl chloride spray Apply 1 application topically daily as needed. At HD 05/20/15  Yes Historical Provider, MD  ISENTRESS 400 MG tablet TAKE 1 TABLET(400 MG) BY MOUTH TWICE DAILY 01/27/16  Yes Campbell Riches, MD  multivitamin (RENA-VIT) TABS tablet Take 1 tablet by mouth daily.     Yes Historical Provider, MD  traZODone (DESYREL) 50 MG tablet Take 50 mg by mouth at bedtime as needed for sleep.   Yes Historical Provider, MD  oxyCODONE-acetaminophen (ROXICET) 5-325 MG tablet Take 1-2 tablets by mouth every 4 (four) hours as needed. Patient not taking: Reported on 04/08/2016 02/07/16   Angelia Mould, MD  tenofovir (VIREAD) 300 MG tablet Take 1 tablet (300 mg total) by mouth every Saturday. After dialysis on Saturday 01/27/16   Campbell Riches, MD   BP 180/81 mmHg  Pulse 71  Temp(Src) 98.4 F (36.9 C) (Oral)  Resp 14  SpO2 100% Physical Exam  Constitutional: He is oriented to person, place, and time. He appears well-developed and well-nourished. No distress.  HENT:  Head: Normocephalic and atraumatic.  Right Ear: External ear normal.  Left Ear: External ear normal.  Eyes: Conjunctivae and EOM are normal. Pupils are equal, round, and reactive to light.  Neck: Normal range of motion and phonation normal. Neck supple.  Cardiovascular: Normal rate, regular rhythm and normal heart sounds.   Left upper arm vascular fistula, with normal thrill. No discrete areas of swelling are appreciated. No active bleeding from the fistula.  Pulmonary/Chest: Effort normal and breath sounds normal. He exhibits no bony tenderness.   Abdominal: Soft. There is no tenderness.  Musculoskeletal: Normal range of motion.  Neurological: He is alert and oriented to person, place, and time. No cranial nerve deficit or sensory deficit. He exhibits normal muscle tone. Coordination normal.  Skin: Skin is warm, dry and intact.  Psychiatric: He has a normal mood and affect. His behavior is normal. Judgment and thought content normal.  Nursing note and vitals reviewed.   ED Course  Procedures (including critical care time)  Medications  0.9 %  sodium chloride infusion (not administered)  amLODipine (NORVASC) tablet 5 mg (5 mg Oral Given 04/08/16 1536)  albuterol (PROVENTIL) (2.5 MG/3ML) 0.083% nebulizer solution 2.5 mg (2.5 mg Nebulization Given 04/08/16 1536)  cloNIDine (CATAPRES)  tablet 0.1 mg (0.1 mg Oral Given 04/08/16 1536)    Patient Vitals for the past 24 hrs:  BP Temp Temp src Pulse Resp SpO2  04/08/16 1545 180/81 mmHg - - 71 14 100 %  04/08/16 1530 198/90 mmHg - - 74 11 100 %  04/08/16 1515 192/95 mmHg - - 79 12 99 %  04/08/16 1500 179/82 mmHg - - 79 17 99 %  04/08/16 1445 193/85 mmHg - - 81 17 97 %  04/08/16 1430 167/76 mmHg - - 73 20 98 %  04/08/16 1415 176/73 mmHg - - 76 18 100 %  04/08/16 1400 - - - 72 17 100 %  04/08/16 1345 190/90 mmHg - - 70 - 99 %  04/08/16 1333 183/93 mmHg - - 75 18 98 %  04/08/16 1330 183/93 mmHg - - 78 - 98 %  04/08/16 1236 188/86 mmHg 98.4 F (36.9 C) Oral 77 20 100 %    4:12 PM Reevaluation with update and discussion. After initial assessment and treatment, an updated evaluation reveals no further c/o . Jerolyn Flenniken L    Case discussed with Dr. Bridgett Larsson, vascular surgery at the request of the patient's brother. Dr. Bridgett Larsson agreed to come to the ED to evaluate the vascular fistula.  Labs Review Labs Reviewed  COMPREHENSIVE METABOLIC PANEL - Abnormal; Notable for the following:    Glucose, Bld 104 (*)    BUN 106 (*)    Creatinine, Ser 17.44 (*)    ALT 10 (*)    GFR calc non Af Amer 3  (*)    GFR calc Af Amer 3 (*)    All other components within normal limits  CBC - Abnormal; Notable for the following:    RBC 2.15 (*)    Hemoglobin 6.1 (*)    HCT 18.3 (*)    RDW 15.6 (*)    Platelets 117 (*)    All other components within normal limits  TYPE AND SCREEN  PREPARE RBC (CROSSMATCH)    Imaging Review Dg Chest 2 View  04/08/2016  CLINICAL DATA:  Three day history of shortness of breath. EXAM: CHEST  2 VIEW COMPARISON:  07/31/2014. FINDINGS: Lungs are hyperexpanded. Interstitial markings are diffusely coarsened with chronic features. Basilar interstitial and alveolar opacity is slightly asymmetric, right greater than left. Cardiopericardial silhouette is at upper limits of normal for size. The visualized bony structures of the thorax are intact. IMPRESSION: Emphysema with chronic interstitial coarsening. Bibasilar interstitial and alveolar opacity, right greater than left. Imaging features may be related to atypical infection or bronchopneumonia. Electronically Signed   By: Misty Stanley M.D.   On: 04/08/2016 16:20   I have personally reviewed and evaluated these images and lab results as part of my medical decision-making.   EKG Interpretation None      MDM   Final diagnoses:  Anemia, unspecified anemia type    Anemia, without clear cause. Patient is somewhat symptomatic. Anemia is likely multifactorial. No evidence for congestive heart failure or unstable hemodynamic status.  Nursing Notes Reviewed/ Care Coordinated, and agree without changes. Applicable Imaging Reviewed.  Interpretation of Laboratory Data incorporated into ED treatment  Plan: Admit    Daleen Bo, MD 04/08/16 [phone]

## 2016-04-08 NOTE — Progress Notes (Signed)
   Daily Progress Note  Asked to re-eval L arm access.  Pt not available currently.  Will see pt later tonight or tomorrow AM.   Adele Barthel, MD [facility]: [phone] Pager: [phone]  04/08/2016, 4:20 PM

## 2016-04-08 NOTE — ED Notes (Signed)
Patient transported to X-ray 

## 2016-04-08 NOTE — Telephone Encounter (Signed)
RN spoke with the pt.  His Dialysis Center also identified that the patient's hemoglobin was ALERT low.  He returned to the Dialysis Center.  Received Mircera 225 mg IV and will have his hemoglobin rechecked in 14 days.  RN reported this to Dr. Johnnye Sima.

## 2016-04-09 ENCOUNTER — Observation Stay (HOSPITAL_BASED_OUTPATIENT_CLINIC_OR_DEPARTMENT_OTHER): Payer: Medicare Other

## 2016-04-09 DIAGNOSIS — N186 End stage renal disease: Secondary | ICD-10-CM | POA: Diagnosis not present

## 2016-04-09 DIAGNOSIS — B20 Human immunodeficiency virus [HIV] disease: Secondary | ICD-10-CM | POA: Diagnosis not present

## 2016-04-09 DIAGNOSIS — D649 Anemia, unspecified: Secondary | ICD-10-CM | POA: Diagnosis not present

## 2016-04-09 DIAGNOSIS — I10 Essential (primary) hypertension: Secondary | ICD-10-CM

## 2016-04-09 DIAGNOSIS — R0600 Dyspnea, unspecified: Secondary | ICD-10-CM | POA: Diagnosis not present

## 2016-04-09 DIAGNOSIS — T82898A Other specified complication of vascular prosthetic devices, implants and grafts, initial encounter: Secondary | ICD-10-CM | POA: Diagnosis not present

## 2016-04-09 DIAGNOSIS — Z992 Dependence on renal dialysis: Secondary | ICD-10-CM

## 2016-04-09 LAB — BASIC METABOLIC PANEL
Anion gap: 12 (ref 5–15)
BUN: 114 mg/dL — AB (ref 6–20)
CHLORIDE: 99 mmol/L — AB (ref 101–111)
CO2: 23 mmol/L (ref 22–32)
CREATININE: 18.61 mg/dL — AB (ref 0.61–1.24)
Calcium: 9 mg/dL (ref 8.9–10.3)
GFR calc Af Amer: 3 mL/min — ABNORMAL LOW (ref >60)
GFR calc non Af Amer: 2 mL/min — ABNORMAL LOW (ref >60)
GLUCOSE: 95 mg/dL (ref 65–99)
Potassium: 4.7 mmol/L (ref 3.5–5.1)
Sodium: 134 mmol/L — ABNORMAL LOW (ref 135–145)

## 2016-04-09 LAB — CBC
HCT: 20.8 % — ABNORMAL LOW (ref 39.0–52.0)
Hemoglobin: 7.1 g/dL — ABNORMAL LOW (ref 13.0–17.0)
MCH: 29.5 pg (ref 26.0–34.0)
MCHC: 34.1 g/dL (ref 30.0–36.0)
MCV: 86.3 fL (ref 78.0–100.0)
PLATELETS: 112 10*3/uL — AB (ref 150–400)
RBC: 2.41 MIL/uL — ABNORMAL LOW (ref 4.22–5.81)
RDW: 15.1 % (ref 11.5–15.5)
WBC: 8.3 10*3/uL (ref 4.0–10.5)

## 2016-04-09 LAB — ECHOCARDIOGRAM COMPLETE

## 2016-04-09 LAB — PREPARE RBC (CROSSMATCH)

## 2016-04-09 LAB — HIV-1 RNA QUANT-NO REFLEX-BLD

## 2016-04-09 MED ORDER — HEPARIN SODIUM (PORCINE) 1000 UNIT/ML DIALYSIS: 20.0000 [IU]/kg | INTRAMUSCULAR | Status: DC | PRN

## 2016-04-09 MED ORDER — SODIUM CHLORIDE 0.9 % IV SOLN: 100.0000 mL | INTRAVENOUS | Status: DC | PRN

## 2016-04-09 MED ORDER — ACETAMINOPHEN 325 MG PO TABS
650.0000 mg | ORAL_TABLET | Freq: Four times a day (QID) | ORAL | Status: DC | PRN
  Administered 2016-04-09: 650 mg via ORAL

## 2016-04-09 MED ORDER — SODIUM CHLORIDE 0.9 % IV SOLN: Freq: Once | INTRAVENOUS | Status: DC

## 2016-04-09 MED ORDER — LIDOCAINE-PRILOCAINE 2.5-2.5 % EX CREA: 1.0000 "application " | TOPICAL_CREAM | CUTANEOUS | Status: DC | PRN

## 2016-04-09 MED ORDER — LIDOCAINE HCL (PF) 1 % IJ SOLN: 5.0000 mL | INTRAMUSCULAR | Status: DC | PRN

## 2016-04-09 MED ORDER — HEPARIN SODIUM (PORCINE) 1000 UNIT/ML DIALYSIS: 40.0000 [IU]/kg | Freq: Once | INTRAMUSCULAR | Status: DC

## 2016-04-09 MED ORDER — ALTEPLASE 2 MG IJ SOLR: 2.0000 mg | Freq: Once | INTRAMUSCULAR | Status: DC | PRN

## 2016-04-09 MED ORDER — IPRATROPIUM-ALBUTEROL 0.5-2.5 (3) MG/3ML IN SOLN
3.0000 mL | Freq: Once | RESPIRATORY_TRACT | Status: AC
Start: 2016-04-09 — End: 2016-04-09
  Administered 2016-04-09: 3 mL via RESPIRATORY_TRACT
  Filled 2016-04-09: qty 3

## 2016-04-09 MED ORDER — ACETAMINOPHEN 650 MG RE SUPP
650.0000 mg | Freq: Four times a day (QID) | RECTAL | Status: DC | PRN
  Filled 2016-04-09: qty 1

## 2016-04-09 MED ORDER — SORBITOL 70 % SOLN: 30.0000 mL | Status: DC | PRN

## 2016-04-09 MED ORDER — VANCOMYCIN HCL IN DEXTROSE 1-5 GM/200ML-% IV SOLN
1000.0000 mg | INTRAVENOUS | Status: AC
Start: 2016-04-10 — End: 2016-04-10
  Administered 2016-04-10: 1000 mg via INTRAVENOUS
  Filled 2016-04-09: qty 200

## 2016-04-09 MED ORDER — PENTAFLUOROPROP-TETRAFLUOROETH EX AERO: 1.0000 "application " | INHALATION_SPRAY | CUTANEOUS | Status: DC | PRN

## 2016-04-09 MED ORDER — ONDANSETRON HCL 4 MG PO TABS: 4.0000 mg | ORAL_TABLET | Freq: Four times a day (QID) | ORAL | Status: DC | PRN

## 2016-04-09 MED ORDER — DOCUSATE SODIUM 283 MG RE ENEM
1.0000 | ENEMA | RECTAL | Status: DC | PRN
  Filled 2016-04-09: qty 1

## 2016-04-09 MED ORDER — ONDANSETRON HCL 4 MG/2ML IJ SOLN: 4.0000 mg | Freq: Four times a day (QID) | INTRAMUSCULAR | Status: DC | PRN

## 2016-04-09 MED ORDER — PERFLUTREN LIPID MICROSPHERE
1.0000 mL | INTRAVENOUS | Status: AC | PRN
  Administered 2016-04-09: 2 mL via INTRAVENOUS
  Filled 2016-04-09: qty 10

## 2016-04-09 MED ORDER — HEPARIN SODIUM (PORCINE) 1000 UNIT/ML DIALYSIS: 1000.0000 [IU] | INTRAMUSCULAR | Status: DC | PRN

## 2016-04-09 MED ORDER — DIPHENHYDRAMINE HCL 25 MG PO CAPS
ORAL_CAPSULE | ORAL | Status: AC
  Filled 2016-04-09: qty 1

## 2016-04-09 MED ORDER — ACETAMINOPHEN 325 MG PO TABS
ORAL_TABLET | ORAL | Status: AC
  Filled 2016-04-09: qty 2

## 2016-04-09 MED ORDER — CAMPHOR-MENTHOL 0.5-0.5 % EX LOTN
1.0000 "application " | TOPICAL_LOTION | Freq: Three times a day (TID) | CUTANEOUS | Status: DC | PRN
  Filled 2016-04-09: qty 222

## 2016-04-09 MED ORDER — HYDRALAZINE HCL 25 MG PO TABS
25.0000 mg | ORAL_TABLET | Freq: Three times a day (TID) | ORAL | Status: DC
  Administered 2016-04-09 (×2): 25 mg via ORAL
  Filled 2016-04-09 (×2): qty 1

## 2016-04-09 MED ORDER — DIPHENHYDRAMINE HCL 25 MG PO CAPS
25.0000 mg | ORAL_CAPSULE | Freq: Once | ORAL | Status: AC
  Administered 2016-04-09: 25 mg via ORAL

## 2016-04-09 MED ORDER — LIDOCAINE-PRILOCAINE 2.5-2.5 % EX CREA
1.0000 | TOPICAL_CREAM | CUTANEOUS | Status: DC | PRN
Start: 2016-04-09 — End: 2016-04-10

## 2016-04-09 MED ORDER — NEPRO/CARBSTEADY PO LIQD: 237.0000 mL | Freq: Three times a day (TID) | ORAL | Status: DC | PRN

## 2016-04-09 MED ORDER — HYDROXYZINE HCL 25 MG PO TABS: 25.0000 mg | ORAL_TABLET | Freq: Three times a day (TID) | ORAL | Status: DC | PRN

## 2016-04-09 MED ORDER — CALCIUM CARBONATE 1250 MG/5ML PO SUSP: 500.0000 mg | Freq: Four times a day (QID) | ORAL | Status: DC | PRN

## 2016-04-09 NOTE — Progress Notes (Signed)
Hemodialysis- Tolerated well without issue. UF 4.5L. Unable to meet 5L goal d/t symptoms during last half hour. 2 units prbcs given. Pt currently has no complaints. Report called to 6E.

## 2016-04-09 NOTE — Progress Notes (Signed)
Internal Medicine Attending  Date: 04/09/2016  Patient name: Jeffrey [name] Medical record number: [id number] Date of birth: 01-30-1956 Age: 60 y.o. Gender: male  I saw and evaluated the patient. I reviewed the resident's note by Dr. Young Berry and I agree with the resident's findings and plans as documented in her progress note.  Please see my H&P dated 04/09/2016 for the specifics of my evaluation, assessment, and plan from earlier today.

## 2016-04-09 NOTE — Progress Notes (Signed)
Hemodialysis- Treatment started at 1300. Pt complains of shortness of breath. HOB elevated and 02 increased to 3L. Sats 96-98%. UF goal 5L per Dr. Jimmy Footman. Pt is also to receive 2 units on HD per NP. (already received one on floor, but MD would like additional 2 units in HD.) Order placed for additional units. Pt currently resting with no complaints. Continue to monitor closely.

## 2016-04-09 NOTE — Progress Notes (Addendum)
Notified by nurse of shortness of breath, evaluated patient at bedside.  Jeffrey [name] reports continuing dyspnea, shallow breathing, and orthopnea, similar to upon admission.  Last home HD on Mon.  No CP or pleuritic pain.  Last albuterol neb 0130.  No beta blocker on board.  HR 80s, sinus rhythm on monitor, tachypneic, moderate respiratory distress, sat 100% on 2L [place] stayed at 97% on RA after 5 minutes of conversation.  Poor air movement, reduced breath sounds over bilateral lung bases, mild end expiratory wheezes, no crackles.  No LE edema, no JVD.  No conjunctival pallor.  Pt does not seem volume overloaded (no crackles, no JVD/LE edema), but minimal wheezing also does not make obstruction a compelling case for shortness of breath.  May most likely continue to be symptomatic anemia.  Would also consider PE if dyspnea persists once anemia is resolved. Trial duo-neb and assess for symptomatic improvement.  Post-transfusion CBC pending.  Echo ordered for tomorrow.  Update: Pt reported some symtpomatic improvement with duoneb.  Post-transfusion Hgb 6.1 -> 7.1.  Appropriate increase, but still symptomatic.  Will give additional 1U RBCs.  If sxns continue, consider repeat CXR with lateral decub to look for pleural effusion.

## 2016-04-09 NOTE — Progress Notes (Signed)
   Subjective: Jeffrey [name] was evaluated this morning on rounds.  Patient continues to endorse shortness of breath.  He is now able to lay flat without increased difficulty breathing.  Last hemodialysis was Monday at home lasting from 4-4.5 hrs. Denies leg swelling or chest pain.      Objective: Vital signs in last 24 hours: Filed Vitals:   04/09/16 1300 04/09/16 1305 04/09/16 1330 04/09/16 1359  BP: 194/109 187/100 178/102 172/96  Pulse: 82 83 85 84  Temp: 98.2 F (36.8 C)     TempSrc: Oral     Resp: 19     Weight: 202 lb 9.6 oz (91.9 kg)     SpO2: 96%      Physical Exam Physical Exam  Constitutional: He is oriented to person, place, and time.  Mild distress  HENT:  Head: Normocephalic and atraumatic.  Eyes: Pupils are equal, round, and reactive to light.  Cardiovascular:  Murmur appreciated   Pulmonary/Chest:  Crackles noted bilaterally in lower lung bases  Abdominal: Soft. He exhibits no distension. There is no tenderness.  Musculoskeletal: He exhibits no edema.  Neurological: He is alert and oriented to person, place, and time.  Skin: Skin is warm and dry.  Palpable thrill on left AV fistula     Assessment/Plan: Symptomatic anemia  Hemoglobin is 7.1 after one unit of packed RBC. He has a history of anemia  likely due to his chronic kidney disease.  Patient received one unit of blood yesterday in the ED and one additional unit of blood was ordered.  Per nephrology notes an additional 2 units may be given in HD.  Patient may be receiving a total of 3 units of packed RBCs.    - 1 unit of packed RBCs  ESRD Patient had home dialysis on Monday.  He is due for dialysis today.   Patient has bilateral lower lung base crackles. No lower extremity edema.  Questionable effectiveness of home dialysis with elevated BUN of 106. Patient has had bleeding from AV fistula site, surgery will be done tomorrow to repair the most proximal buttonhole. - Nephorlogy was consulted and patient  is receiving dialysis today. - Vascular surgery 7/21  Orthopnea Patient was able to lay flat yesterday without worsening of breathing.  The Echo showed LV EF 60%-65% and grade 1 diastolic dysfunction.  Chest X ray showed some curly B lines.  Likely fluid overload from ineffective home dialysis. - dialysis may help with restoring breathing    COPD Stating well on room air.  Received one duoneb treatment and one albuterol nebulizer treatment  - Continue Albuterol nebulizer Q6 PRN  HIV Last CD4 count is 280 on 7/18. Last viral load is (28) 5 months ago. - Continuing home meds  HTN Has been hypertensive. Hydralazine was restarted Q8.  Carvedilol was also restarted 25mg  BID.  Patient only takes clonidine PRN and is now being held during admission.  Amlodipine will be continued as scheduled 5mg  BID.   -Resuming home Blood Pressure medications.  Dispo: Anticipated discharge in approximately 1 day(s).     Valinda Party, DO 04/09/2016, 2:11 PM Pager: [phone]

## 2016-04-09 NOTE — H&P (Signed)
Internal Medicine Attending Admission Note Date: 04/09/2016  Patient name: Jeffrey [name] Medical record number: [id number] Date of birth: Oct 14, 1955 Age: 60 y.o. Gender: male  I saw and evaluated the patient. I reviewed the resident's note and I agree with the resident's findings and plan as documented in the resident's note.  Chief Complaint(s): Progressive shortness of breath.  History - key components related to admission:  Jeffrey [name] is a 60 year old man with history of end-stage renal disease secondary to hypertension requiring home hemodialysis, anemia of chronic renal disease, HIV, and a clinical diagnosis of chronic obstructive pulmonary disease who presents with a two-week history of progressive shortness of breath to the point of dyspnea at rest. He was in his usual state of health until 2 weeks prior to admission when he noted the onset of dyspnea on exertion. This slowly progressed over the next 2 weeks to dyspnea at rest. In the past he used to get relief from his albuterol inhaler but more recently has not been getting any relief. He denies any fevers, shakes, chills, nausea, vomiting, chest pain, or lower extremity edema. He has had the development of orthopnea. He states he's been compliant with his medications. Of note, he is on home dialysis and usually is dialyzed for 4 hours 3 days a week although the schedule varies because he is a musician sometimes on the road. His last hemodialysis session was 2 days prior to admission. He states after that he was near his dry weight of 92 kg. With the progressive shortness of breath he presented to the emergency department where he was found to be anemic with a hemoglobin of 6.1. He was therefore admitted to the internal medicine teaching service for symptomatic anemia. When he was weighed he was 98 kg up 6 kg from his estimated dry weight.  Since admission he received 1 unit of packed red blood cells with an increase in his hemoglobin from 6.1 to  7.1. Despite this he noted continued dyspnea at rest, particularly while lying flat when seen on rounds the morning after admission.  Physical Exam - key components related to admission:  Filed Vitals:   04/09/16 1515 04/09/16 1527 04/09/16 1540 04/09/16 1558  BP:  183/106 183/97 182/93  Pulse: 76 80 78 78  Temp: 98.1 F (36.7 C) 98 F (36.7 C) 98.2 F (36.8 C)   TempSrc: Oral  Oral   Resp:  22 19 21   Weight:      SpO2:   99% 99%   Gen.: Well-developed, well-nourished, man mildly dyspneic lying at a 45 angle cooperative to the interview and exam. Lungs: Bibasal or posterior inspiratory crackles without wheezes, rhonchi, or rales. Heart: Regular rate and rhythm with a 2/6 systolic murmur best heard in the left upper sternal border.  Lab results:  Basic Metabolic Panel:  Recent Labs  04/08/16 1246 04/09/16 0420  NA 136 134*  K 4.6 4.7  CL 101 99*  CO2 22 23  GLUCOSE 104* 95  BUN 106* 114*  CREATININE 17.44* 18.61*  CALCIUM 9.2 9.0   Liver Function Tests:  Recent Labs  04/07/16 1435 04/08/16 1246  AST 14 16  ALT 6* 10*  ALKPHOS 43 44  BILITOT 0.5 0.4  PROT 6.9 7.4  ALBUMIN 3.9 3.8   CBC:  Recent Labs  04/08/16 1246 04/09/16 0420  WBC 6.3 8.3  HGB 6.1* 7.1*  HCT 18.3* 20.8*  MCV 85.1 86.3  PLT 117* 112*   Fasting Lipid Panel:  Recent Labs  04/07/16 1435  CHOL 73*  HDL 27*  LDLCALC 33  TRIG 64  CHOLHDL 2.7   Imaging results:  Dg Chest 2 View  04/08/2016  CLINICAL DATA:  Three day history of shortness of breath. EXAM: CHEST  2 VIEW COMPARISON:  07/31/2014. FINDINGS: Lungs are hyperexpanded. Interstitial markings are diffusely coarsened with chronic features. Basilar interstitial and alveolar opacity is slightly asymmetric, right greater than left. Cardiopericardial silhouette is at upper limits of normal for size. The visualized bony structures of the thorax are intact. IMPRESSION: Emphysema with chronic interstitial coarsening. Bibasilar  interstitial and alveolar opacity, right greater than left. Imaging features may be related to atypical infection or bronchopneumonia. Electronically Signed   By: Misty Stanley M.D.   On: 04/08/2016 16:20   I personally reviewed the chest x-ray and compared it to the most recent previous chest x-ray. His cardiac silhouette has enlarged significantly and he has bibasilar interstitial changes with possible left basilar Kerly B-lines. He has some old scarring in the right costophrenic angle. There is also increased prominence of his interstitium suggesting volume overload.  Assessment & Plan by Problem:  Jeffrey [name] is a 60 year old man with history of end-stage renal disease secondary to hypertension requiring home hemodialysis, anemia of chronic renal disease, HIV, and a clinical diagnosis of chronic obstructive pulmonary disease who presents with a two-week history of progressive shortness of breath to the point of dyspnea at rest. Clinically he appears to be volume overloaded likely secondary to ineffective dialysis as suggested by a BUN of 106 and a weight of 6 kg above his presumed dry weight. Other factors contributed to his dyspnea include his anemia of chronic renal disease and elevated afterload.  1) Volume overload: An echocardiogram revealed a systolic ejection fraction of 65% with grade 1 diastolic dysfunction. He is undergoing hemodialysis with the idea of removing 5 L of fluid. During hemodialysis he received 2 additional units of packed red blood cells for his anemia of chronic renal disease. We are hopeful that with removal of this fluid and replacement of his hemoglobin his dyspnea should markedly improve.  2) Hypertension: We will reinstitute his antihypertensive regimen that he takes at home and reassess his blood pressure after hemodialysis with volume removal. If his blood pressure remains elevated we will adjust his antihypertensive regimen to more aggressively treat his elevated  afterload which could be contributing to his dyspnea.  3) Disposition: Pending the response to hemodialysis, blood transfusion, and afterload reduction. I suspect he should be ready for discharge home in the next 1-2 days if he improves with the above interventions.

## 2016-04-09 NOTE — Progress Notes (Signed)
  Echocardiogram 2D Echocardiogram with Definity has been performed.  Darlina Sicilian M 04/09/2016, 11:24 AM

## 2016-04-09 NOTE — Care Management Obs Status (Signed)
[place] NOTIFICATION   Patient Details  Name: MAN MOM MRN: [id number] Date of Birth: 1956-05-10   Medicare Observation Status Notification Given:  Yes  Pt out of room, OBS notification left for pt with CM phone number for pt to call for questions.   Oshae Simmering, Rory Percy, RN 04/09/2016, 3:15 PM

## 2016-04-09 NOTE — Progress Notes (Signed)
   Daily Progress Note   - keep NPO - will try to schedule plication of L RC AVF PSA tomorrow   Adele Barthel, MD [facility] Office: [phone] Pager: [phone]  04/09/2016, 8:10 AM

## 2016-04-09 NOTE — Progress Notes (Signed)
Jeffrey [name] is a 60 Y/O AA male with ESRD on home hemodialysis 4 days a week. PHM significant for HTN, HIV, Gout,Seizures, thrombocytopenia. He has been admitted as observation for patient for symptomatic anemia. Patient's OP HGB was 9.2 on 04/02/16. He says he received 225 mircera on Tuesday-this has been verified by Home Therapy at [facility]. Has been having issues with bleeding from arterial and venous portion of AVF. Nurses from Home Therapy have been out to assist with bleeding issues. Dr. Bridgett Larsson has been consulted to evaluate AVF. HGB was 6.1 was admission-rec'd one unit of PRBCs. HGB is currently 7.1. Scr 18.61 BUN 114. He endorses SOB with increased use of albuterol inhaler past 2 weeks. CXR shows Bibasilar interstitial and alveolar opacity, right greater thanleft. Imaging features may be related to atypical infection or bronchopneumonia. Emphysema with chronic interstitial coarsening WBC 8.3 no fevers.   Will enter HD orders for today-get standing pre wts-no wt entered in computer. Will give 2 units PRBCs, use 2.0K 2.25 Ca bath, no heparin since patient has reported bleeding issues.  Please notify us if patient status changed to inpatient and we will consult formally.   Jeffrey [name], [facility] [phone] (pager)  I have seen and examined this patient and agree with plan per Jeffrey [name].  Pt seen on HD. Ap 130 Vp 170  BFR 400.  Marland Kitchen Jeffrey Morro T,MD 04/09/2016 3:52 PM

## 2016-04-10 ENCOUNTER — Telehealth: Payer: Self-pay | Admitting: Internal Medicine

## 2016-04-10 ENCOUNTER — Encounter (HOSPITAL_COMMUNITY): Payer: Self-pay | Admitting: Anesthesiology

## 2016-04-10 ENCOUNTER — Encounter (HOSPITAL_COMMUNITY)
Admission: EM | Disposition: A | Discharge status: Home / Self Care | Payer: Self-pay | Source: Home / Self Care | Attending: Emergency Medicine

## 2016-04-10 ENCOUNTER — Observation Stay (HOSPITAL_COMMUNITY): Payer: Medicare Other | Admitting: Anesthesiology

## 2016-04-10 DIAGNOSIS — E8770 Fluid overload, unspecified: Secondary | ICD-10-CM

## 2016-04-10 DIAGNOSIS — I10 Essential (primary) hypertension: Secondary | ICD-10-CM | POA: Diagnosis not present

## 2016-04-10 DIAGNOSIS — T82898A Other specified complication of vascular prosthetic devices, implants and grafts, initial encounter: Secondary | ICD-10-CM | POA: Diagnosis not present

## 2016-04-10 DIAGNOSIS — I729 Aneurysm of unspecified site: Secondary | ICD-10-CM | POA: Diagnosis not present

## 2016-04-10 DIAGNOSIS — N186 End stage renal disease: Secondary | ICD-10-CM | POA: Diagnosis not present

## 2016-04-10 DIAGNOSIS — Z992 Dependence on renal dialysis: Secondary | ICD-10-CM | POA: Diagnosis not present

## 2016-04-10 DIAGNOSIS — I120 Hypertensive chronic kidney disease with stage 5 chronic kidney disease or end stage renal disease: Secondary | ICD-10-CM | POA: Diagnosis not present

## 2016-04-10 DIAGNOSIS — D649 Anemia, unspecified: Secondary | ICD-10-CM | POA: Diagnosis not present

## 2016-04-10 HISTORY — PX: REVISON OF ARTERIOVENOUS FISTULA: SHX6074

## 2016-04-10 LAB — TYPE AND SCREEN
ABO/RH(D): O NEG
Antibody Screen: NEGATIVE
UNIT DIVISION: 0
Unit division: 0
Unit division: 0
Unit division: 0

## 2016-04-10 LAB — BASIC METABOLIC PANEL
ANION GAP: 10 (ref 5–15)
BUN: 40 mg/dL — ABNORMAL HIGH (ref 6–20)
CALCIUM: 8.6 mg/dL — AB (ref 8.9–10.3)
CO2: 30 mmol/L (ref 22–32)
Chloride: 95 mmol/L — ABNORMAL LOW (ref 101–111)
Creatinine, Ser: 9.89 mg/dL — ABNORMAL HIGH (ref 0.61–1.24)
GFR, EST AFRICAN AMERICAN: 6 mL/min — AB (ref >60)
GFR, EST NON AFRICAN AMERICAN: 5 mL/min — AB (ref >60)
GLUCOSE: 86 mg/dL (ref 65–99)
POTASSIUM: 3.6 mmol/L (ref 3.5–5.1)
SODIUM: 135 mmol/L (ref 135–145)

## 2016-04-10 LAB — CBC
HCT: 23.6 % — ABNORMAL LOW (ref 39.0–52.0)
HEMOGLOBIN: 8.2 g/dL — AB (ref 13.0–17.0)
MCH: 29.6 pg (ref 26.0–34.0)
MCHC: 34.7 g/dL (ref 30.0–36.0)
MCV: 85.2 fL (ref 78.0–100.0)
PLATELETS: 93 10*3/uL — AB (ref 150–400)
RBC: 2.77 MIL/uL — ABNORMAL LOW (ref 4.22–5.81)
RDW: 14.7 % (ref 11.5–15.5)
WBC: 5.9 10*3/uL (ref 4.0–10.5)

## 2016-04-10 SURGERY — REVISON OF ARTERIOVENOUS FISTULA
Anesthesia: Monitor Anesthesia Care | Site: Arm Lower | Laterality: Left

## 2016-04-10 MED ORDER — PROTAMINE SULFATE 10 MG/ML IV SOLN
INTRAVENOUS | Status: AC
  Filled 2016-04-10: qty 5

## 2016-04-10 MED ORDER — DEXTROSE 5 % IV SOLN
INTRAVENOUS | Status: DC | PRN
  Administered 2016-04-10: 09:00:00 via INTRAVENOUS

## 2016-04-10 MED ORDER — SODIUM CHLORIDE 0.9 % IV SOLN
INTRAVENOUS | Status: DC
  Administered 2016-04-10: 10 mL/h via INTRAVENOUS

## 2016-04-10 MED ORDER — PROTAMINE SULFATE 10 MG/ML IV SOLN
INTRAVENOUS | Status: DC | PRN
  Administered 2016-04-10 (×4): 10 mg via INTRAVENOUS

## 2016-04-10 MED ORDER — IPRATROPIUM-ALBUTEROL 18-103 MCG/ACT IN AERO: 1.0000 | INHALATION_SPRAY | RESPIRATORY_TRACT | Status: DC | PRN

## 2016-04-10 MED ORDER — HEPARIN SODIUM (PORCINE) 5000 UNIT/ML IJ SOLN
INTRAMUSCULAR | Status: DC | PRN
  Administered 2016-04-10: 500 mL

## 2016-04-10 MED ORDER — HYDRALAZINE HCL 50 MG PO TABS
50.0000 mg | ORAL_TABLET | Freq: Three times a day (TID) | ORAL | Status: DC
  Administered 2016-04-10: 50 mg via ORAL
  Filled 2016-04-10: qty 1

## 2016-04-10 MED ORDER — PROMETHAZINE HCL 25 MG/ML IJ SOLN: 6.2500 mg | INTRAMUSCULAR | Status: DC | PRN

## 2016-04-10 MED ORDER — HEPARIN SODIUM (PORCINE) 1000 UNIT/ML IJ SOLN
INTRAMUSCULAR | Status: DC | PRN
  Administered 2016-04-10: 7000 [IU] via INTRAVENOUS

## 2016-04-10 MED ORDER — LIDOCAINE HCL (CARDIAC) 20 MG/ML IV SOLN
INTRAVENOUS | Status: DC | PRN
  Administered 2016-04-10: 50 mg via INTRAVENOUS

## 2016-04-10 MED ORDER — LIDOCAINE HCL (PF) 1 % IJ SOLN
INTRAMUSCULAR | Status: AC
  Filled 2016-04-10: qty 30

## 2016-04-10 MED ORDER — SODIUM CHLORIDE 0.9 % IR SOLN
Status: DC | PRN
  Administered 2016-04-10: 1000 mL

## 2016-04-10 MED ORDER — HEPARIN SODIUM (PORCINE) 1000 UNIT/ML IJ SOLN
INTRAMUSCULAR | Status: AC
  Filled 2016-04-10: qty 1

## 2016-04-10 MED ORDER — OXYCODONE-ACETAMINOPHEN 5-325 MG PO TABS
1.0000 | ORAL_TABLET | ORAL | Status: DC | PRN
  Administered 2016-04-10: 2 via ORAL
  Filled 2016-04-10: qty 2

## 2016-04-10 MED ORDER — FENTANYL CITRATE (PF) 250 MCG/5ML IJ SOLN
INTRAMUSCULAR | Status: AC
  Filled 2016-04-10: qty 5

## 2016-04-10 MED ORDER — OXYCODONE-ACETAMINOPHEN 5-325 MG PO TABS: 1.0000 | ORAL_TABLET | Freq: Three times a day (TID) | ORAL | Status: DC | PRN

## 2016-04-10 MED ORDER — MIDAZOLAM HCL 2 MG/2ML IJ SOLN: 0.5000 mg | Freq: Once | INTRAMUSCULAR | Status: DC | PRN

## 2016-04-10 MED ORDER — FENTANYL CITRATE (PF) 100 MCG/2ML IJ SOLN
25.0000 ug | INTRAMUSCULAR | Status: DC | PRN
  Administered 2016-04-10: 25 ug via INTRAVENOUS

## 2016-04-10 MED ORDER — MEPERIDINE HCL 25 MG/ML IJ SOLN: 6.2500 mg | INTRAMUSCULAR | Status: DC | PRN

## 2016-04-10 MED ORDER — LIDOCAINE 2% (20 MG/ML) 5 ML SYRINGE
INTRAMUSCULAR | Status: AC
  Filled 2016-04-10: qty 5

## 2016-04-10 MED ORDER — MIDAZOLAM HCL 2 MG/2ML IJ SOLN
INTRAMUSCULAR | Status: AC
  Filled 2016-04-10: qty 2

## 2016-04-10 MED ORDER — MIDAZOLAM HCL 5 MG/5ML IJ SOLN
INTRAMUSCULAR | Status: DC | PRN
  Administered 2016-04-10 (×2): 1 mg via INTRAVENOUS

## 2016-04-10 MED ORDER — PROPOFOL 500 MG/50ML IV EMUL
INTRAVENOUS | Status: DC | PRN
  Administered 2016-04-10: 25 ug/kg/min via INTRAVENOUS

## 2016-04-10 MED ORDER — LIDOCAINE HCL (PF) 1 % IJ SOLN
INTRAMUSCULAR | Status: DC | PRN
  Administered 2016-04-10: 2 mL

## 2016-04-10 MED ORDER — FENTANYL CITRATE (PF) 100 MCG/2ML IJ SOLN
INTRAMUSCULAR | Status: DC | PRN
  Administered 2016-04-10 (×2): 50 ug via INTRAVENOUS

## 2016-04-10 MED ORDER — FENTANYL CITRATE (PF) 100 MCG/2ML IJ SOLN
INTRAMUSCULAR | Status: AC
  Filled 2016-04-10: qty 2

## 2016-04-10 MED ORDER — OXYCODONE-ACETAMINOPHEN 5-325 MG PO TABS: 1.0000 | ORAL_TABLET | ORAL | Status: DC | PRN

## 2016-04-10 MED ORDER — HYDRALAZINE HCL 50 MG PO TABS: 50.0000 mg | ORAL_TABLET | Freq: Three times a day (TID) | ORAL | Status: DC

## 2016-04-10 SURGICAL SUPPLY — 31 items
CANISTER SUCTION 2500CC (MISCELLANEOUS) ×3 IMPLANT
CANNULA VESSEL 3MM 2 BLNT TIP (CANNULA) IMPLANT
CLIP TI MEDIUM 6 (CLIP) ×3 IMPLANT
CLIP TI WIDE RED SMALL 6 (CLIP) ×3 IMPLANT
COVER PROBE W GEL 5X96 (DRAPES) IMPLANT
ELECT REM PT RETURN 9FT ADLT (ELECTROSURGICAL) ×3
ELECTRODE REM PT RTRN 9FT ADLT (ELECTROSURGICAL) ×1 IMPLANT
GLOVE BIO SURGEON STRL SZ7.5 (GLOVE) ×3 IMPLANT
GLOVE BIOGEL PI IND STRL 6.5 (GLOVE) ×4 IMPLANT
GLOVE BIOGEL PI IND STRL 7.0 (GLOVE) ×1 IMPLANT
GLOVE BIOGEL PI IND STRL 8 (GLOVE) ×1 IMPLANT
GLOVE BIOGEL PI INDICATOR 6.5 (GLOVE) ×8
GLOVE BIOGEL PI INDICATOR 7.0 (GLOVE) ×2
GLOVE BIOGEL PI INDICATOR 8 (GLOVE) ×2
GLOVE ECLIPSE 6.5 STRL STRAW (GLOVE) ×3 IMPLANT
GLOVE SURG SS PI 6.5 STRL IVOR (GLOVE) ×3 IMPLANT
GOWN STRL REUS W/ TWL LRG LVL3 (GOWN DISPOSABLE) ×3 IMPLANT
GOWN STRL REUS W/TWL LRG LVL3 (GOWN DISPOSABLE) ×6
KIT BASIN OR (CUSTOM PROCEDURE TRAY) ×3 IMPLANT
KIT ROOM TURNOVER OR (KITS) ×3 IMPLANT
LIQUID BAND (GAUZE/BANDAGES/DRESSINGS) ×3 IMPLANT
NS IRRIG 1000ML POUR BTL (IV SOLUTION) ×3 IMPLANT
PACK CV ACCESS (CUSTOM PROCEDURE TRAY) ×3 IMPLANT
PAD ARMBOARD 7.5X6 YLW CONV (MISCELLANEOUS) ×6 IMPLANT
SPONGE SURGIFOAM ABS GEL 100 (HEMOSTASIS) IMPLANT
SUT PROLENE 6 0 BV (SUTURE) ×3 IMPLANT
SUT VIC AB 3-0 SH 27 (SUTURE) ×2
SUT VIC AB 3-0 SH 27X BRD (SUTURE) ×1 IMPLANT
SUT VICRYL 4-0 PS2 18IN ABS (SUTURE) ×3 IMPLANT
UNDERPAD 30X30 INCONTINENT (UNDERPADS AND DIAPERS) ×3 IMPLANT
WATER STERILE IRR 1000ML POUR (IV SOLUTION) IMPLANT

## 2016-04-10 NOTE — Interval H&P Note (Signed)
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 After consideration of risks, benefits and other options for treatment, the patient has consented to  Procedure(s): PLICATION OF RADIOCEPHALIC ARTERIOVENOUS FISTULA PSEUDOANEURYSM (Left) as a surgical intervention .  The patient's history has been reviewed, patient examined, no change in status, stable for surgery.  I have reviewed the patient's chart and labs.  Questions were answered to the patient's satisfaction.     [name]

## 2016-04-10 NOTE — Anesthesia Preprocedure Evaluation (Addendum)
Anesthesia Evaluation  Patient identified by MRN, date of birth, ID band Patient awake    Reviewed: Allergy & Precautions, NPO status , Patient's Chart, lab work & pertinent test results  History of Anesthesia Complications Negative for: history of anesthetic complications  Airway Mallampati: I  TM Distance: >3 FB Neck ROM: Full    Dental  (+) Dental Advisory Given   Pulmonary asthma , COPD,  COPD inhaler, former smoker (quit 2016),    breath sounds clear to auscultation       Cardiovascular hypertension, Pt. on medications and Pt. on home beta blockers (-) angina Rhythm:Regular Rate:Normal  7/17 ECHO: EF 60-65%, mild AI   Neuro/Psych Anxiety negative neurological ROS     GI/Hepatic negative GI ROS, (+) Hepatitis -, B  Endo/Other  negative endocrine ROS  Renal/GU Dialysis and ESRFRenal disease (K+ 3.6)     Musculoskeletal  (+) Arthritis ,   Abdominal   Peds  Hematology  (+) anemia , Hb 8.2, plt 93K   Anesthesia Other Findings   Reproductive/Obstetrics                           Anesthesia Physical Anesthesia Plan  ASA: III  Anesthesia Plan: MAC   Post-op Pain Management:    Induction: Intravenous  Airway Management Planned: Natural Airway and Simple Face Mask  Additional Equipment:   Intra-op Plan:   Post-operative Plan:   Informed Consent: I have reviewed the patients History and Physical, chart, labs and discussed the procedure including the risks, benefits and alternatives for the proposed anesthesia with the patient or authorized representative who has indicated his/her understanding and acceptance.   Dental advisory given  Plan Discussed with: CRNA and Surgeon  Anesthesia Plan Comments: (Plan routine monitors, MAC)        Anesthesia Quick Evaluation

## 2016-04-10 NOTE — Addendum Note (Signed)
Addendum  created 04/10/16 1157 by Terrill Mohr, CRNA   Modules edited: Anesthesia Medication Administration

## 2016-04-10 NOTE — Progress Notes (Signed)
   Subjective: Mr. Discala was evaluated this morning on rounds post his AV fistula surgery.  He stated their were no complications and felt some pain to his left arm.  He stated that his breathing has improved greatly after Hemodialysis and was able to walk to the bathroom without difficulty breathing.  He denies any chest pain or leg swelling.  Objective: Vital signs in last 24 hours: Filed Vitals:   04/10/16 1045 04/10/16 1100 04/10/16 1115 04/10/16 1502  BP: 151/75 169/90 161/89 155/79  Pulse: 69 69 65 68  Temp:   97.8 F (36.6 C) 97.2 F (36.2 C)  TempSrc:    Oral  Resp: 20 17 19 18   Weight:      SpO2: 96% 96% 95% 96%   Physical Exam Physical Exam  Constitutional: He is oriented to person, place, and time.  HENT:  Head: Normocephalic and atraumatic.  Cardiovascular: Normal rate and regular rhythm.   Murmur appreciated  Pulmonary/Chest: Effort normal and breath sounds normal. No respiratory distress.  Abdominal: Soft. There is no tenderness.  Musculoskeletal: He exhibits no edema.  Neurological: He is alert and oriented to person, place, and time.  Skin: Skin is warm and dry.  Left arm fistula with palpable thrill     Assessment/Plan: Symptomatic anemia Patient's hemoglobin is currently 8.2 and this is after receiving a total of 3 units of blood during admission. Patient received Mircera 225 mg IV on Tuesday and is having his hemoglobin rechecked 14 days after administration.  Patient reported blood loss on Monday from his fistula site.  He stated it was gushing out before he could full get control with pressure to the site.  With pressure the bleeding stopped.  With the 3 units of PRBC and Mircera it may take a few more days for hemoglobin to correct to his baseline.  He currently denies SOB, chest pain and states he feels good.  ESRD on dialysis  Patient received dialysis yesterday and tolerated it well.  His dry weight is currently 88.6.  Patient's previous dry weight goal  with home dialysis was 92kg.  It was suggested that his new goal should be 88kg when doing home dialysis.    Essential hypertension 177/79 Despite dialysis and current home BP medications continued on admission patient's BP is still elevated.  This is a chronic issue and patient has agreed to establish care with [facility].  An appointment was made for hospital follow up. - Hydralazine was increased to 50mg    HIV disease ([place]) Home meds continued   COPD Stating well on room air. Did not request duoneb treatment and took two albuterol nebulizer treatments.  Patient stated he no longer had a combivent inhaler and would like a prescription as it was helpful in the past. - Combivent inhaler prescription    Dispo: Anticipated discharge today     Valinda Party, DO 04/10/2016, 6:10 PM Pager: [phone]

## 2016-04-10 NOTE — Discharge Summary (Signed)
Name: Jeffrey [name] MRN: [id number] DOB: 04/15/1956 60 y.o. PCP: Jeffrey Agent, MD  Date of Admission: 04/08/2016  1:13 PM Date of Discharge: 04/10/2016 Attending Physician: Jeffrey Linsey, MD  Discharge Diagnosis: 1. Symptomatic anemia 2. ESRD on dialysis  3. Essential hypertension     Discharge Medications:   Medication List    TAKE these medications   albuterol (2.5 MG/3ML) 0.083% nebulizer solution Commonly known as:  PROVENTIL Take 2.5 mg by nebulization every 6 (six) hours as needed for wheezing or shortness of breath.   albuterol-ipratropium 18-103 MCG/ACT inhaler Commonly known as:  COMBIVENT Inhale 1-2 puffs into the lungs every 4 (four) hours as needed for wheezing or shortness of breath.   amLODipine 10 MG tablet Commonly known as:  NORVASC Take 5 mg by mouth 2 (two) times daily.   calcitRIOL 0.25 MCG capsule Commonly known as:  ROCALTROL Take 0.25 mcg by mouth daily.   calcium acetate 667 MG capsule Commonly known as:  PHOSLO Take 1,334-2,001 mg by mouth 3 (three) times daily with meals. 2 capsules with Lunch and 3 capsules with Dinner   carvedilol 25 MG tablet Commonly known as:  COREG Take 25 mg by mouth 2 (two) times daily with a meal.   cloNIDine 0.1 MG tablet Commonly known as:  CATAPRES Take 0.1 mg by mouth as needed (as needed related to hypertension while on hemodialysis).   EPIVIR HBV 5 MG/ML solution Generic drug:  lamivudine TAKE 5 ML BY MOUTH ONCE DAILY   ethyl chloride spray Apply 1 application topically daily as needed. At HD   hydrALAZINE 50 MG tablet Commonly known as:  APRESOLINE Take 1 tablet (50 mg total) by mouth every 8 (eight) hours. What changed:  medication strength  how much to take  when to take this   ISENTRESS 400 MG tablet Generic drug:  raltegravir TAKE 1 TABLET(400 MG) BY MOUTH TWICE DAILY   multivitamin Tabs tablet Take 1 tablet by mouth daily.   oxyCODONE-acetaminophen 5-325 MG tablet Commonly known  as:  PERCOCET/ROXICET Take 1 tablet by mouth every 8 (eight) hours as needed for severe pain. What changed:  how much to take  when to take this  reasons to take this   tenofovir 300 MG tablet Commonly known as:  VIREAD Take 1 tablet (300 mg total) by mouth every Saturday. After dialysis on Saturday   traZODone 50 MG tablet Commonly known as:  DESYREL Take 50 mg by mouth at bedtime as needed for sleep.       Disposition and follow-up:   JeffreyJeffrey GIANNO [name] was discharged from [facility] in stable condition.  At the hospital follow up visit please address:  1.  Blood pressure management and home dialysis effectiveness, new dry weight should be 88kg   2.  Labs / imaging needed at time of follow-up: BMP  3.  Pending labs/ test needing follow-up: none  Follow-up Appointments: Follow-up Information    [place] On 04/16/2016.   Contact information: [address] [place] Hospital Course by problem list:  Symptomatic anemia  Jeffrey [name] is a 60 yo male that presented to the ED per recommendation of his physician because recent lab work showed a hemoglobin of 6.3.  He noted a progressing SOB in the past 2 weeks. Patient's hemoglobin increased to 8.2 after receiving a total of 3 units of blood during admission. Patient  received Mircera 225 mg IV on Tuesday and is having his hemoglobin rechecked 14 days after administration. Patient reported blood loss on Monday from his fistula site. He stated it was gushing out before he could get full control with pressure to the site.  With pressure the bleeding stopped.  With the 3 units of PRBC and Mircera it may take a few more days for hemoglobin to correct to his baseline.   Before discharge he denied SOB or chest pain. During hospitalization patient underwent vascular surgery for recurrent bleeding episodes from left radiocephalic AV fistula he was  having prior to admission.  Patient tolerated the surgery well and no complications noted.  Patient was discharged with Oxycodone-acetaminophen 5-325.    ESRD on dialysis  Patient has home dialysis 3-4x a week and schedule is sporadic.  BUN was 106 and creatinine 17.44 on admission.  Patient had hemodialysis in the hospital and afterwards BUN was 40 and creatinine 9.89.  Patient's weight dropped from 92kg to 88kg.  It was recommended to continue a goal of 88kg for dry weight when doing home dialysis.           Essential hypertension Despite Dialysis and continuation of blood pressure home medications patient's blood pressure remained elevated.  Patient stated that his blood pressure has not been well controlled for a while and he agreed to establish care with [facility] and follow up after hospital discharge. Hydralazine was increased to 50mg  and patient was discharged with new dose.     Discharge Vitals:   BP (!) 155/79 (BP Location: Right Arm)   Pulse 68   Temp 97.2 F (36.2 C) (Oral)   Resp 18   Wt 195 lb 5.2 oz (88.6 kg)   SpO2 96%   BMI 26.49 kg/m    Pertinent Labs, Studies, and Procedures:  Vascular surgery - Plication of left radiocephalic AV fistula  Discharge Instructions: Discharge Instructions    Diet - low sodium heart healthy    Complete by:  As directed   Discharge instructions    Complete by:  As directed   Jeffrey [name] it was a pleasure taking care of you during your admission   Your new dry weight is 88kg.  Please adjust your home dialysis to accommodate this weight goal.  You hydralazine was increased to 50mg  3 times a day.   Increase activity slowly    Complete by:  As directed      Signed: Valinda Party, DO 04/13/2016, 9:21 PM   Pager: [phone]

## 2016-04-10 NOTE — Discharge Instructions (Signed)
You have an appointment on July 27th at 1:45pm for hospital follow up at the [name]

## 2016-04-10 NOTE — Op Note (Signed)
    NAME: Jeffrey [name]    MRN: [id number] DOB: 01-28-1956    DATE OF OPERATION: 04/10/2016  PREOP DIAGNOSIS: Recurrent bleeding episodes from left radiocephalic AV fistula  POSTOP DIAGNOSIS: Same  PROCEDURE: Plication of left radiocephalic AV fistula  SURGEON: Judeth Cornfield. Scot Dock, MD, FACS  ASSIST: Izetta Dakin RNFA  ANESTHESIA: local with sedation   EBL: minimal  INDICATIONS: Jeffrey [name] is a 60 y.o. male has a left radiocephalic AV fistula which she uses for dialysis. They have been using the buttonhole technique and he has developed recurrent bleeding episodes from one of his buttonhole's. He presents for plication.  FINDINGS: the area of concern was repaired with 2 horizontal mattress 6-0 Prolene sutures.  TECHNIQUE: The patient was taken to the operating room and sedated by anesthesia. The left upper extremity was prepped and draped in the usual sterile fashion. The area of concern was identified and an elliptical incision was made encompassing this area after the skin was anesthetized. The skin was undermined proximally and distally so that it could be closed after. The fistula was dissected free anteriorly. The patient was then heparinized. The fistula was compressed proximally and distally and then the area of concern was excised. The defect remaining and the fistula was closed with 2 interrupted 60 horizontal mattress sutures. At the completion was good hemostasis and still good fistula thrill. Heparin was partially reversed with protamine. The wound was closed with a 4-0 Vicryl suture. Dermabond was applied. The patient tolerated the procedure well and was transferred to the recovery room in stable condition. All needle and sponge counts were correct.  [name], MD, FACS [facility]  DATE OF DICTATION:   04/10/2016

## 2016-04-10 NOTE — Anesthesia Postprocedure Evaluation (Signed)
Anesthesia Post Note  Patient: GUNER AVALO  Procedure(s) Performed: Procedure(s) (LRB): PLICATION OF LEFT ARM RADIOCEPHALIC ARTERIOVENOUS FISTULA PSEUDOANEURYSM (Left)  Patient location during evaluation: PACU Anesthesia Type: MAC Level of consciousness: awake and alert, oriented and patient cooperative Pain management: pain level controlled Vital Signs Assessment: post-procedure vital signs reviewed and stable Respiratory status: spontaneous breathing, nonlabored ventilation and respiratory function stable Cardiovascular status: blood pressure returned to baseline and stable Postop Assessment: no signs of nausea or vomiting Anesthetic complications: no    Last Vitals:  Filed Vitals:   04/10/16 1100 04/10/16 1115  BP: 169/90 161/89  Pulse: 69 65  Temp:  36.6 C  Resp: 17 19    Last Pain:  Filed Vitals:   04/10/16 1121  PainSc: 5                  Salihah Peckham,E. Jalisha Enneking

## 2016-04-10 NOTE — Progress Notes (Signed)
Internal Medicine Attending Progress Note Date: 04/10/2016  Patient name: Jeffrey [name] Medical record number: [id number] Date of birth: 1955-12-19 Age: 60 y.o. Gender: male  S: Jeffrey [name] slept well last night after his volume was removed in hemodialysis. In fact, when he was seen on rounds he was lying flat without any dyspnea. This was a marked improvement from the day before.  Physical Exam - key components related to admission:  Filed Vitals:   04/10/16 1045 04/10/16 1100 04/10/16 1115 04/10/16 1502  BP: 151/75 169/90 161/89 155/79  Pulse: 69 69 65 68  Temp:   97.8 F (36.6 C) 97.2 F (36.2 C)  TempSrc:    Oral  Resp: 20 17 19 18   Weight:      SpO2: 96% 96% 95% 96%   Gen.: Well-developed, well-nourished, man lying comfortably in bed in no acute distress. Extremities: Without edema.  Lab results:  Basic Metabolic Panel:  Recent Labs  04/09/16 0420 04/10/16 0137  NA 134* 135  K 4.7 3.6  CL 99* 95*  CO2 23 30  GLUCOSE 95 86  BUN 114* 40*  CREATININE 18.61* 9.89*  CALCIUM 9.0 8.6*   Liver Function Tests:  Recent Labs  04/08/16 1246  AST 16  ALT 10*  ALKPHOS 44  BILITOT 0.4  PROT 7.4  ALBUMIN 3.8   CBC:  Recent Labs  04/09/16 0420 04/10/16 0447  WBC 8.3 5.9  HGB 7.1* 8.2*  HCT 20.8* 23.6*  MCV 86.3 85.2  PLT 112* 93*   Assessment & Plan:  Jeffrey [name] is a 60 year old man who was admitted to the internal medicine teaching service with dyspnea. He was found to be 6 kg over his estimated dry weight and was dyspneic related to the volume overload. He was successfully diuresed to a weight of 88 kg which is likely his new dry weight. He was also transfused with 3 units of pack per blood cells and concomitant improvement in his dyspnea.  1) End-stage renal disease: He was instructed to change his estimated dry weight to 88 kg at home when he does home dialysis. We are otherwise continuing his other chronic medications for his other chronic medical  conditions.  2) Disposition: He is stable for discharge home today with follow-up in his nephrologist and primary care provider's office.

## 2016-04-10 NOTE — Transfer of Care (Signed)
Immediate Anesthesia Transfer of Care Note  Patient: Jeffrey [name]  Procedure(s) Performed: Procedure(s): PLICATION OF LEFT ARM RADIOCEPHALIC ARTERIOVENOUS FISTULA PSEUDOANEURYSM (Left)  Patient Location: PACU  Anesthesia Type:MAC  Level of Consciousness: awake, alert  and patient cooperative  Airway & Oxygen Therapy: Patient Spontanous Breathing and Patient connected to face mask oxygen  Post-op Assessment: Report given to RN, Post -op Vital signs reviewed and stable and Patient moving all extremities  Post vital signs: Reviewed and stable  Last Vitals:  Filed Vitals:   04/10/16 0603 04/10/16 1033  BP: 179/79 148/81  Pulse: 92 65  Temp: 37.3 C 36.7 C  Resp: 21 14    Last Pain:  Filed Vitals:   04/10/16 1034  PainSc: 0-No pain         Complications: No apparent anesthesia complications

## 2016-04-10 NOTE — H&P (View-Only) (Signed)
   Daily Progress Note   - keep NPO - will try to schedule plication of L RC AVF PSA tomorrow   Adele Barthel, MD [facility] Office: [phone] Pager: [phone]  04/09/2016, 8:10 AM

## 2016-04-10 NOTE — Telephone Encounter (Signed)
HFU/ TRANS/FU/-Pt has been sch for HFU on 04/16/2016.

## 2016-04-10 NOTE — Progress Notes (Signed)
Patient Discharge: Disposition: Patient discharged to home with brother. Education: Reviewed medications, prescriptions, follow-up appointments and discharge instructions, understood and acknowledged. IV: Discontinued IV before discharge. Telemetry: N/A Transportation: Patient transported in w/c out of the unit accompanied by the staff and brother. Belongings: patient took all his belongings with him.

## 2016-04-12 ENCOUNTER — Encounter (HOSPITAL_COMMUNITY): Payer: Self-pay | Admitting: Vascular Surgery

## 2016-04-13 DIAGNOSIS — D631 Anemia in chronic kidney disease: Secondary | ICD-10-CM | POA: Diagnosis not present

## 2016-04-13 DIAGNOSIS — D509 Iron deficiency anemia, unspecified: Secondary | ICD-10-CM | POA: Diagnosis not present

## 2016-04-13 DIAGNOSIS — E8779 Other fluid overload: Secondary | ICD-10-CM | POA: Diagnosis not present

## 2016-04-13 DIAGNOSIS — N186 End stage renal disease: Secondary | ICD-10-CM | POA: Diagnosis not present

## 2016-04-13 DIAGNOSIS — N2581 Secondary hyperparathyroidism of renal origin: Secondary | ICD-10-CM | POA: Diagnosis not present

## 2016-04-13 NOTE — Progress Notes (Signed)
04/13/16 Received call from [place] with questions about perscription sent over Friday by Dr. Heber [place]. Gave [facility] the pager number to reach Dr. Heber [place] to get questions answered. Marnell Mcdaniel, [place]

## 2016-04-14 LAB — HEPATITIS B DNA, ULTRAQUANTITATIVE, PCR: Hepatitis B DNA: <20 IU/mL (ref <20)

## 2016-04-15 DIAGNOSIS — N186 End stage renal disease: Secondary | ICD-10-CM | POA: Diagnosis not present

## 2016-04-15 DIAGNOSIS — N2581 Secondary hyperparathyroidism of renal origin: Secondary | ICD-10-CM | POA: Diagnosis not present

## 2016-04-15 DIAGNOSIS — D631 Anemia in chronic kidney disease: Secondary | ICD-10-CM | POA: Diagnosis not present

## 2016-04-15 DIAGNOSIS — D509 Iron deficiency anemia, unspecified: Secondary | ICD-10-CM | POA: Diagnosis not present

## 2016-04-15 DIAGNOSIS — E8779 Other fluid overload: Secondary | ICD-10-CM | POA: Diagnosis not present

## 2016-04-16 ENCOUNTER — Encounter: Payer: Self-pay | Admitting: Internal Medicine

## 2016-04-16 ENCOUNTER — Ambulatory Visit: Payer: Medicare Other

## 2016-04-16 ENCOUNTER — Ambulatory Visit (INDEPENDENT_AMBULATORY_CARE_PROVIDER_SITE_OTHER): Payer: Medicare Other | Admitting: Internal Medicine

## 2016-04-16 VITALS — BP 201/85 | HR 71 | Temp 98.1°F | Ht 72.0 in | Wt 199.1 lb

## 2016-04-16 DIAGNOSIS — I10 Essential (primary) hypertension: Secondary | ICD-10-CM

## 2016-04-16 DIAGNOSIS — D649 Anemia, unspecified: Secondary | ICD-10-CM

## 2016-04-16 DIAGNOSIS — N186 End stage renal disease: Secondary | ICD-10-CM

## 2016-04-16 DIAGNOSIS — Z992 Dependence on renal dialysis: Secondary | ICD-10-CM | POA: Diagnosis not present

## 2016-04-16 DIAGNOSIS — E8779 Other fluid overload: Secondary | ICD-10-CM | POA: Diagnosis not present

## 2016-04-16 DIAGNOSIS — N2581 Secondary hyperparathyroidism of renal origin: Secondary | ICD-10-CM | POA: Diagnosis not present

## 2016-04-16 DIAGNOSIS — D509 Iron deficiency anemia, unspecified: Secondary | ICD-10-CM | POA: Diagnosis not present

## 2016-04-16 DIAGNOSIS — D631 Anemia in chronic kidney disease: Secondary | ICD-10-CM | POA: Diagnosis not present

## 2016-04-16 NOTE — Assessment & Plan Note (Addendum)
Patient has end-stage renal disease on hemodialysis and essential hypertension. The patient's current antihypertensive regimen includes carvedilol 25 mg twice daily, amlodipine 2.5 mg twice daily and hydralazine 50 mg 3 times daily. In the office today the patient's blood pressure was 201/85. He denied chest pain, shortness of breath or palpitations. He says he missed all of his blood pressure medications this morning and dialysis yesterday afternoon. He said he would take his blood pressure medications once he got home and that a nurse is coming to his house to assist him with dialysis this afternoon. We discussed the importance of not missing his antihypertensive medications as this could lead to rebound hypertension and also the importance of following his dialysis regimen without missed sessions. At this time I will not make adjustments to his antihypertensive medications given that his blood pressure in the office today today does not reflect his true blood pressure when on appropriate medications and with dialysis. -- Continue current antihypertensive medications as prescribed -- Continue dialysis sessions

## 2016-04-16 NOTE — Progress Notes (Signed)
Medicine attending: I personally interviewed and briefly examined this patient on the day of the patient visit and reviewed pertinent clinical ,laboratory, and radiographic data  with resident physician Dr. Ophelia Shoulder and we discussed a management plan. Quick post hospital check for this man admitted with acute on chronic anemia due to ESRD on dialysis and HIV dis. He got blood in hospital. He is clinically stable. He will continue dialysis and erythropoietin/iron support. He is getting Micera epo receptor agonist.

## 2016-04-16 NOTE — Patient Instructions (Signed)
It was a pleasure seeing you today. Thank you for choosing [facility] for your healthcare needs.  -- Take medications as discussed -- Get dialysis as scheduled

## 2016-04-16 NOTE — Assessment & Plan Note (Addendum)
Patient has end-stage renal disease currently on hemodialysis. In his recent hospitalization he underwent a procedure by vascular surgery for recurrent fistula bleeding. He will continue with his current dialysis schedule. His new dry weight should be 88 kg per his discharge summary note. He missed dialysis yesterday afternoon but has a nurse coming to his home today and will have dialysis. -- Continue with dialysis  -- New dry weight of 88 kg

## 2016-04-16 NOTE — Progress Notes (Signed)
   CC: Hospital follow-up HPI: [name] is a 60 y.o. male with a h/o of anxiety, asthma, end-stage renal disease on hemodialysis, gout, HIV infection, hyperthyroidism and hypertension who presents as a hospital follow-up from symptomatic anemia with a hemoglobin of 6.3. The patient denies headache, changes in vision, shortness of breath, chest pain, nausea, vomiting or abdominal pain. He has no additional acute complaints or concerns today's visit.  Please see problem-based charting for status of medical issues pertinent to this visit.     Past Medical History:  Diagnosis Date  . Anemia   . Anxiety   . Arthritis   . Asthma    per pt hx  . End stage renal disease on home HD 07/10/2011   Started HD in September 2012 at [facility] with a tunneled HD catheter, now on home HD with NxtStage. Dialyzing through AVF L lower arm with buttonhole technique as of mid 2014. His brother does the HD treatments at home.  They are roommates for 23 years.  The brother works 3rd shift and gets off about 8am and then puts Mr [name] on HD in the morning after getting home. Most of the time he does HD about 4 times a week, for about 4 hours per treatment. Cause of ESRD was HTN according to patient. He says he let his health go and ending up with complications, and that he didn't like seeing doctors in those days.  He says he was diagnosed with severe HTN when he lived in [place] in his 40's.   . Gout   . Hepatitis B carrier   . HIV infection ([place])   . Hypertension   . Hyperthyroidism   . Pneumonia   . Seizure ([place])   . Seizures ([place]) 06/02/2011   pt denies this  . Thrombocytopenia ([place])    Current Outpatient Rx  . Order #: [id number] Class: Historical Med  . Order #: [id number] Class: Normal  . Order #: [id number] Class: Historical Med  . Order #: [id number] Class: Historical Med  . Order #: [id number] Class: Historical Med  . Order #: [id number] Class: Historical Med  . Order #: [id number] Class: Historical Med   . Order #: [id number] Class: Normal  . Order #: [id number] Class: Historical Med  . Order #: [id number] Class: Normal  . Order #: [id number] Class: Normal  . Order #: [id number] Class: Historical Med  . Order #: [id number] Class: Print  . Order #: [id number] Class: Normal  . Order #: [id number] Class: Historical Med      Review of Systems: A complete ROS was negative except as per HPI.  Physical Exam: There were no vitals filed for this visit. General appearance: alert and cooperative Head: Normocephalic, without obvious abnormality, atraumatic Lungs: clear to auscultation bilaterally Heart: regular rate and rhythm, S1, S2 normal, no murmur, click, rub or gallop Abdomen: soft, non-tender; bowel sounds normal; no masses,  no organomegaly and bowel sounds present, no abdominal bruits auscultated Extremities: extremities normal, atraumatic, no cyanosis or edema and left upper extremity with fistula for dialysis access  Assessment & Plan:  See encounters tab for problem based medical decision making. Patient seen with Dr. Beryle Beams  Signed: Ophelia Shoulder, MD 04/16/2016, 9:54 AM  Pager: [phone]

## 2016-04-16 NOTE — Assessment & Plan Note (Addendum)
Patient presents today for follow-up of a recent hospital admission for symptomatic anemia with a hemoglobin of 6.3 complaining of shortness of breath and fatigue. During this admission he was given 3 units of packed red blood cells and was discharged without shortness of breath, chest pain, fatigue or weakness. At today's visit the patient denies changes in vision, shortness of breath, chest pain or weakness. He states he feels back to his baseline and no longer has symptoms of anemia.  -- Continue dialysis as scheduled -- Continue medications as prescribed

## 2016-04-17 NOTE — Telephone Encounter (Signed)
Patient seen for hospital follow-up. All concerns addressed at this time.

## 2016-04-19 DIAGNOSIS — N186 End stage renal disease: Secondary | ICD-10-CM | POA: Diagnosis not present

## 2016-04-19 DIAGNOSIS — D631 Anemia in chronic kidney disease: Secondary | ICD-10-CM | POA: Diagnosis not present

## 2016-04-19 DIAGNOSIS — E8779 Other fluid overload: Secondary | ICD-10-CM | POA: Diagnosis not present

## 2016-04-19 DIAGNOSIS — D509 Iron deficiency anemia, unspecified: Secondary | ICD-10-CM | POA: Diagnosis not present

## 2016-04-19 DIAGNOSIS — N2581 Secondary hyperparathyroidism of renal origin: Secondary | ICD-10-CM | POA: Diagnosis not present

## 2016-04-20 ENCOUNTER — Other Ambulatory Visit: Payer: Self-pay | Admitting: Gastroenterology

## 2016-04-20 DIAGNOSIS — Z992 Dependence on renal dialysis: Secondary | ICD-10-CM | POA: Diagnosis not present

## 2016-04-20 DIAGNOSIS — D509 Iron deficiency anemia, unspecified: Secondary | ICD-10-CM | POA: Diagnosis not present

## 2016-04-20 DIAGNOSIS — E8779 Other fluid overload: Secondary | ICD-10-CM | POA: Diagnosis not present

## 2016-04-20 DIAGNOSIS — D696 Thrombocytopenia, unspecified: Secondary | ICD-10-CM | POA: Diagnosis not present

## 2016-04-20 DIAGNOSIS — N186 End stage renal disease: Secondary | ICD-10-CM | POA: Diagnosis not present

## 2016-04-20 DIAGNOSIS — D649 Anemia, unspecified: Secondary | ICD-10-CM | POA: Diagnosis not present

## 2016-04-20 DIAGNOSIS — I120 Hypertensive chronic kidney disease with stage 5 chronic kidney disease or end stage renal disease: Secondary | ICD-10-CM | POA: Diagnosis not present

## 2016-04-20 DIAGNOSIS — D631 Anemia in chronic kidney disease: Secondary | ICD-10-CM | POA: Diagnosis not present

## 2016-04-20 DIAGNOSIS — B181 Chronic viral hepatitis B without delta-agent: Secondary | ICD-10-CM

## 2016-04-20 DIAGNOSIS — N2581 Secondary hyperparathyroidism of renal origin: Secondary | ICD-10-CM | POA: Diagnosis not present

## 2016-04-20 DIAGNOSIS — B20 Human immunodeficiency virus [HIV] disease: Secondary | ICD-10-CM | POA: Diagnosis not present

## 2016-04-22 DIAGNOSIS — N2589 Other disorders resulting from impaired renal tubular function: Secondary | ICD-10-CM | POA: Diagnosis not present

## 2016-04-22 DIAGNOSIS — D631 Anemia in chronic kidney disease: Secondary | ICD-10-CM | POA: Diagnosis not present

## 2016-04-22 DIAGNOSIS — N186 End stage renal disease: Secondary | ICD-10-CM | POA: Diagnosis not present

## 2016-04-22 DIAGNOSIS — Z79899 Other long term (current) drug therapy: Secondary | ICD-10-CM | POA: Diagnosis not present

## 2016-04-22 DIAGNOSIS — D509 Iron deficiency anemia, unspecified: Secondary | ICD-10-CM | POA: Diagnosis not present

## 2016-04-23 DIAGNOSIS — Z79899 Other long term (current) drug therapy: Secondary | ICD-10-CM | POA: Diagnosis not present

## 2016-04-23 DIAGNOSIS — M109 Gout, unspecified: Secondary | ICD-10-CM | POA: Diagnosis not present

## 2016-04-23 DIAGNOSIS — D631 Anemia in chronic kidney disease: Secondary | ICD-10-CM | POA: Diagnosis not present

## 2016-04-23 DIAGNOSIS — D509 Iron deficiency anemia, unspecified: Secondary | ICD-10-CM | POA: Diagnosis not present

## 2016-04-23 DIAGNOSIS — N2589 Other disorders resulting from impaired renal tubular function: Secondary | ICD-10-CM | POA: Diagnosis not present

## 2016-04-23 DIAGNOSIS — N186 End stage renal disease: Secondary | ICD-10-CM | POA: Diagnosis not present

## 2016-04-26 DIAGNOSIS — Z79899 Other long term (current) drug therapy: Secondary | ICD-10-CM | POA: Diagnosis not present

## 2016-04-26 DIAGNOSIS — D509 Iron deficiency anemia, unspecified: Secondary | ICD-10-CM | POA: Diagnosis not present

## 2016-04-26 DIAGNOSIS — N2589 Other disorders resulting from impaired renal tubular function: Secondary | ICD-10-CM | POA: Diagnosis not present

## 2016-04-26 DIAGNOSIS — D631 Anemia in chronic kidney disease: Secondary | ICD-10-CM | POA: Diagnosis not present

## 2016-04-26 DIAGNOSIS — N186 End stage renal disease: Secondary | ICD-10-CM | POA: Diagnosis not present

## 2016-04-27 ENCOUNTER — Ambulatory Visit: Payer: Medicare Other | Admitting: Infectious Diseases

## 2016-04-27 ENCOUNTER — Telehealth: Payer: Self-pay | Admitting: *Deleted

## 2016-04-27 NOTE — Telephone Encounter (Signed)
Patient called this morning to cancel his appointment. He is run down from dialysis, received a blood transfusion last week.  He is following up with nephrology this week.   RN relayed recent lab results, scheduled patient for follow up in October. Landis Gandy, RN

## 2016-04-28 ENCOUNTER — Inpatient Hospital Stay: Admission: RE | Admit: 2016-04-28 | Payer: Medicare Other | Source: Ambulatory Visit

## 2016-04-29 DIAGNOSIS — D631 Anemia in chronic kidney disease: Secondary | ICD-10-CM | POA: Diagnosis not present

## 2016-04-29 DIAGNOSIS — N186 End stage renal disease: Secondary | ICD-10-CM | POA: Diagnosis not present

## 2016-04-29 DIAGNOSIS — N2589 Other disorders resulting from impaired renal tubular function: Secondary | ICD-10-CM | POA: Diagnosis not present

## 2016-04-29 DIAGNOSIS — Z79899 Other long term (current) drug therapy: Secondary | ICD-10-CM | POA: Diagnosis not present

## 2016-04-29 DIAGNOSIS — D509 Iron deficiency anemia, unspecified: Secondary | ICD-10-CM | POA: Diagnosis not present

## 2016-05-01 DIAGNOSIS — Z79899 Other long term (current) drug therapy: Secondary | ICD-10-CM | POA: Diagnosis not present

## 2016-05-01 DIAGNOSIS — N186 End stage renal disease: Secondary | ICD-10-CM | POA: Diagnosis not present

## 2016-05-01 DIAGNOSIS — D509 Iron deficiency anemia, unspecified: Secondary | ICD-10-CM | POA: Diagnosis not present

## 2016-05-01 DIAGNOSIS — N2589 Other disorders resulting from impaired renal tubular function: Secondary | ICD-10-CM | POA: Diagnosis not present

## 2016-05-01 DIAGNOSIS — D631 Anemia in chronic kidney disease: Secondary | ICD-10-CM | POA: Diagnosis not present

## 2016-05-03 DIAGNOSIS — D631 Anemia in chronic kidney disease: Secondary | ICD-10-CM | POA: Diagnosis not present

## 2016-05-03 DIAGNOSIS — Z79899 Other long term (current) drug therapy: Secondary | ICD-10-CM | POA: Diagnosis not present

## 2016-05-03 DIAGNOSIS — N2589 Other disorders resulting from impaired renal tubular function: Secondary | ICD-10-CM | POA: Diagnosis not present

## 2016-05-03 DIAGNOSIS — N186 End stage renal disease: Secondary | ICD-10-CM | POA: Diagnosis not present

## 2016-05-03 DIAGNOSIS — D509 Iron deficiency anemia, unspecified: Secondary | ICD-10-CM | POA: Diagnosis not present

## 2016-05-05 ENCOUNTER — Other Ambulatory Visit: Payer: Medicare Other

## 2016-05-06 DIAGNOSIS — N2589 Other disorders resulting from impaired renal tubular function: Secondary | ICD-10-CM | POA: Diagnosis not present

## 2016-05-06 DIAGNOSIS — D509 Iron deficiency anemia, unspecified: Secondary | ICD-10-CM | POA: Diagnosis not present

## 2016-05-06 DIAGNOSIS — N186 End stage renal disease: Secondary | ICD-10-CM | POA: Diagnosis not present

## 2016-05-06 DIAGNOSIS — D631 Anemia in chronic kidney disease: Secondary | ICD-10-CM | POA: Diagnosis not present

## 2016-05-06 DIAGNOSIS — Z79899 Other long term (current) drug therapy: Secondary | ICD-10-CM | POA: Diagnosis not present

## 2016-05-08 DIAGNOSIS — D509 Iron deficiency anemia, unspecified: Secondary | ICD-10-CM | POA: Diagnosis not present

## 2016-05-08 DIAGNOSIS — Z79899 Other long term (current) drug therapy: Secondary | ICD-10-CM | POA: Diagnosis not present

## 2016-05-08 DIAGNOSIS — N2589 Other disorders resulting from impaired renal tubular function: Secondary | ICD-10-CM | POA: Diagnosis not present

## 2016-05-08 DIAGNOSIS — D631 Anemia in chronic kidney disease: Secondary | ICD-10-CM | POA: Diagnosis not present

## 2016-05-08 DIAGNOSIS — N186 End stage renal disease: Secondary | ICD-10-CM | POA: Diagnosis not present

## 2016-05-09 DIAGNOSIS — D509 Iron deficiency anemia, unspecified: Secondary | ICD-10-CM | POA: Diagnosis not present

## 2016-05-09 DIAGNOSIS — N186 End stage renal disease: Secondary | ICD-10-CM | POA: Diagnosis not present

## 2016-05-09 DIAGNOSIS — N2589 Other disorders resulting from impaired renal tubular function: Secondary | ICD-10-CM | POA: Diagnosis not present

## 2016-05-09 DIAGNOSIS — Z79899 Other long term (current) drug therapy: Secondary | ICD-10-CM | POA: Diagnosis not present

## 2016-05-09 DIAGNOSIS — D631 Anemia in chronic kidney disease: Secondary | ICD-10-CM | POA: Diagnosis not present

## 2016-05-11 DIAGNOSIS — I871 Compression of vein: Secondary | ICD-10-CM | POA: Diagnosis not present

## 2016-05-11 DIAGNOSIS — Z992 Dependence on renal dialysis: Secondary | ICD-10-CM | POA: Diagnosis not present

## 2016-05-11 DIAGNOSIS — N186 End stage renal disease: Secondary | ICD-10-CM | POA: Diagnosis not present

## 2016-05-11 DIAGNOSIS — T82858D Stenosis of vascular prosthetic devices, implants and grafts, subsequent encounter: Secondary | ICD-10-CM | POA: Diagnosis not present

## 2016-05-15 DIAGNOSIS — D509 Iron deficiency anemia, unspecified: Secondary | ICD-10-CM | POA: Diagnosis not present

## 2016-05-15 DIAGNOSIS — Z79899 Other long term (current) drug therapy: Secondary | ICD-10-CM | POA: Diagnosis not present

## 2016-05-15 DIAGNOSIS — D631 Anemia in chronic kidney disease: Secondary | ICD-10-CM | POA: Diagnosis not present

## 2016-05-15 DIAGNOSIS — N186 End stage renal disease: Secondary | ICD-10-CM | POA: Diagnosis not present

## 2016-05-15 DIAGNOSIS — N2589 Other disorders resulting from impaired renal tubular function: Secondary | ICD-10-CM | POA: Diagnosis not present

## 2016-05-18 DIAGNOSIS — N186 End stage renal disease: Secondary | ICD-10-CM | POA: Diagnosis not present

## 2016-05-18 DIAGNOSIS — Z79899 Other long term (current) drug therapy: Secondary | ICD-10-CM | POA: Diagnosis not present

## 2016-05-18 DIAGNOSIS — N2589 Other disorders resulting from impaired renal tubular function: Secondary | ICD-10-CM | POA: Diagnosis not present

## 2016-05-18 DIAGNOSIS — D631 Anemia in chronic kidney disease: Secondary | ICD-10-CM | POA: Diagnosis not present

## 2016-05-18 DIAGNOSIS — D509 Iron deficiency anemia, unspecified: Secondary | ICD-10-CM | POA: Diagnosis not present

## 2016-05-20 DIAGNOSIS — Z79899 Other long term (current) drug therapy: Secondary | ICD-10-CM | POA: Diagnosis not present

## 2016-05-20 DIAGNOSIS — N2589 Other disorders resulting from impaired renal tubular function: Secondary | ICD-10-CM | POA: Diagnosis not present

## 2016-05-20 DIAGNOSIS — D509 Iron deficiency anemia, unspecified: Secondary | ICD-10-CM | POA: Diagnosis not present

## 2016-05-20 DIAGNOSIS — N186 End stage renal disease: Secondary | ICD-10-CM | POA: Diagnosis not present

## 2016-05-20 DIAGNOSIS — D631 Anemia in chronic kidney disease: Secondary | ICD-10-CM | POA: Diagnosis not present

## 2016-05-21 DIAGNOSIS — I120 Hypertensive chronic kidney disease with stage 5 chronic kidney disease or end stage renal disease: Secondary | ICD-10-CM | POA: Diagnosis not present

## 2016-05-21 DIAGNOSIS — Z992 Dependence on renal dialysis: Secondary | ICD-10-CM | POA: Diagnosis not present

## 2016-05-21 DIAGNOSIS — N186 End stage renal disease: Secondary | ICD-10-CM | POA: Diagnosis not present

## 2016-05-23 DIAGNOSIS — N186 End stage renal disease: Secondary | ICD-10-CM | POA: Diagnosis not present

## 2016-05-23 DIAGNOSIS — Z4931 Encounter for adequacy testing for hemodialysis: Secondary | ICD-10-CM | POA: Diagnosis not present

## 2016-05-23 DIAGNOSIS — Z79899 Other long term (current) drug therapy: Secondary | ICD-10-CM | POA: Diagnosis not present

## 2016-05-23 DIAGNOSIS — N2581 Secondary hyperparathyroidism of renal origin: Secondary | ICD-10-CM | POA: Diagnosis not present

## 2016-05-23 DIAGNOSIS — D509 Iron deficiency anemia, unspecified: Secondary | ICD-10-CM | POA: Diagnosis not present

## 2016-05-23 DIAGNOSIS — D631 Anemia in chronic kidney disease: Secondary | ICD-10-CM | POA: Diagnosis not present

## 2016-05-26 DIAGNOSIS — Z79899 Other long term (current) drug therapy: Secondary | ICD-10-CM | POA: Diagnosis not present

## 2016-05-26 DIAGNOSIS — N186 End stage renal disease: Secondary | ICD-10-CM | POA: Diagnosis not present

## 2016-05-26 DIAGNOSIS — Z4931 Encounter for adequacy testing for hemodialysis: Secondary | ICD-10-CM | POA: Diagnosis not present

## 2016-05-26 DIAGNOSIS — N2581 Secondary hyperparathyroidism of renal origin: Secondary | ICD-10-CM | POA: Diagnosis not present

## 2016-05-26 DIAGNOSIS — D631 Anemia in chronic kidney disease: Secondary | ICD-10-CM | POA: Diagnosis not present

## 2016-05-26 DIAGNOSIS — D509 Iron deficiency anemia, unspecified: Secondary | ICD-10-CM | POA: Diagnosis not present

## 2016-05-26 DIAGNOSIS — M109 Gout, unspecified: Secondary | ICD-10-CM | POA: Diagnosis not present

## 2016-05-28 DIAGNOSIS — Z4931 Encounter for adequacy testing for hemodialysis: Secondary | ICD-10-CM | POA: Diagnosis not present

## 2016-05-28 DIAGNOSIS — D631 Anemia in chronic kidney disease: Secondary | ICD-10-CM | POA: Diagnosis not present

## 2016-05-28 DIAGNOSIS — N186 End stage renal disease: Secondary | ICD-10-CM | POA: Diagnosis not present

## 2016-05-28 DIAGNOSIS — Z79899 Other long term (current) drug therapy: Secondary | ICD-10-CM | POA: Diagnosis not present

## 2016-05-28 DIAGNOSIS — D509 Iron deficiency anemia, unspecified: Secondary | ICD-10-CM | POA: Diagnosis not present

## 2016-05-28 DIAGNOSIS — N2581 Secondary hyperparathyroidism of renal origin: Secondary | ICD-10-CM | POA: Diagnosis not present

## 2016-05-31 DIAGNOSIS — Z79899 Other long term (current) drug therapy: Secondary | ICD-10-CM | POA: Diagnosis not present

## 2016-05-31 DIAGNOSIS — N186 End stage renal disease: Secondary | ICD-10-CM | POA: Diagnosis not present

## 2016-05-31 DIAGNOSIS — Z4931 Encounter for adequacy testing for hemodialysis: Secondary | ICD-10-CM | POA: Diagnosis not present

## 2016-05-31 DIAGNOSIS — N2581 Secondary hyperparathyroidism of renal origin: Secondary | ICD-10-CM | POA: Diagnosis not present

## 2016-05-31 DIAGNOSIS — D631 Anemia in chronic kidney disease: Secondary | ICD-10-CM | POA: Diagnosis not present

## 2016-05-31 DIAGNOSIS — D509 Iron deficiency anemia, unspecified: Secondary | ICD-10-CM | POA: Diagnosis not present

## 2016-06-03 DIAGNOSIS — D631 Anemia in chronic kidney disease: Secondary | ICD-10-CM | POA: Diagnosis not present

## 2016-06-03 DIAGNOSIS — Z79899 Other long term (current) drug therapy: Secondary | ICD-10-CM | POA: Diagnosis not present

## 2016-06-03 DIAGNOSIS — D509 Iron deficiency anemia, unspecified: Secondary | ICD-10-CM | POA: Diagnosis not present

## 2016-06-03 DIAGNOSIS — N186 End stage renal disease: Secondary | ICD-10-CM | POA: Diagnosis not present

## 2016-06-03 DIAGNOSIS — Z4931 Encounter for adequacy testing for hemodialysis: Secondary | ICD-10-CM | POA: Diagnosis not present

## 2016-06-03 DIAGNOSIS — N2581 Secondary hyperparathyroidism of renal origin: Secondary | ICD-10-CM | POA: Diagnosis not present

## 2016-06-04 DIAGNOSIS — N186 End stage renal disease: Secondary | ICD-10-CM | POA: Diagnosis not present

## 2016-06-04 DIAGNOSIS — Z79899 Other long term (current) drug therapy: Secondary | ICD-10-CM | POA: Diagnosis not present

## 2016-06-04 DIAGNOSIS — Z4931 Encounter for adequacy testing for hemodialysis: Secondary | ICD-10-CM | POA: Diagnosis not present

## 2016-06-04 DIAGNOSIS — D631 Anemia in chronic kidney disease: Secondary | ICD-10-CM | POA: Diagnosis not present

## 2016-06-04 DIAGNOSIS — D509 Iron deficiency anemia, unspecified: Secondary | ICD-10-CM | POA: Diagnosis not present

## 2016-06-04 DIAGNOSIS — N2581 Secondary hyperparathyroidism of renal origin: Secondary | ICD-10-CM | POA: Diagnosis not present

## 2016-06-05 DIAGNOSIS — D631 Anemia in chronic kidney disease: Secondary | ICD-10-CM | POA: Diagnosis not present

## 2016-06-05 DIAGNOSIS — N186 End stage renal disease: Secondary | ICD-10-CM | POA: Diagnosis not present

## 2016-06-05 DIAGNOSIS — Z79899 Other long term (current) drug therapy: Secondary | ICD-10-CM | POA: Diagnosis not present

## 2016-06-05 DIAGNOSIS — D509 Iron deficiency anemia, unspecified: Secondary | ICD-10-CM | POA: Diagnosis not present

## 2016-06-05 DIAGNOSIS — Z4931 Encounter for adequacy testing for hemodialysis: Secondary | ICD-10-CM | POA: Diagnosis not present

## 2016-06-05 DIAGNOSIS — N2581 Secondary hyperparathyroidism of renal origin: Secondary | ICD-10-CM | POA: Diagnosis not present

## 2016-06-06 ENCOUNTER — Other Ambulatory Visit: Payer: Self-pay | Admitting: Internal Medicine

## 2016-06-08 DIAGNOSIS — D509 Iron deficiency anemia, unspecified: Secondary | ICD-10-CM | POA: Diagnosis not present

## 2016-06-08 DIAGNOSIS — Z4931 Encounter for adequacy testing for hemodialysis: Secondary | ICD-10-CM | POA: Diagnosis not present

## 2016-06-08 DIAGNOSIS — N2581 Secondary hyperparathyroidism of renal origin: Secondary | ICD-10-CM | POA: Diagnosis not present

## 2016-06-08 DIAGNOSIS — D631 Anemia in chronic kidney disease: Secondary | ICD-10-CM | POA: Diagnosis not present

## 2016-06-08 DIAGNOSIS — N186 End stage renal disease: Secondary | ICD-10-CM | POA: Diagnosis not present

## 2016-06-08 DIAGNOSIS — Z79899 Other long term (current) drug therapy: Secondary | ICD-10-CM | POA: Diagnosis not present

## 2016-06-10 ENCOUNTER — Other Ambulatory Visit: Payer: Medicare Other

## 2016-06-10 DIAGNOSIS — B20 Human immunodeficiency virus [HIV] disease: Secondary | ICD-10-CM

## 2016-06-10 DIAGNOSIS — D631 Anemia in chronic kidney disease: Secondary | ICD-10-CM | POA: Diagnosis not present

## 2016-06-10 DIAGNOSIS — Z79899 Other long term (current) drug therapy: Secondary | ICD-10-CM | POA: Diagnosis not present

## 2016-06-10 DIAGNOSIS — N2581 Secondary hyperparathyroidism of renal origin: Secondary | ICD-10-CM | POA: Diagnosis not present

## 2016-06-10 DIAGNOSIS — N186 End stage renal disease: Secondary | ICD-10-CM | POA: Diagnosis not present

## 2016-06-10 DIAGNOSIS — Z4931 Encounter for adequacy testing for hemodialysis: Secondary | ICD-10-CM | POA: Diagnosis not present

## 2016-06-10 DIAGNOSIS — D509 Iron deficiency anemia, unspecified: Secondary | ICD-10-CM | POA: Diagnosis not present

## 2016-06-10 LAB — CBC WITH DIFFERENTIAL/PLATELET
BASOS PCT: 0 %
Basophils Absolute: 0 cells/uL (ref 0–200)
EOS PCT: 10 %
Eosinophils Absolute: 570 cells/uL — ABNORMAL HIGH (ref 15–500)
HCT: 29.4 % — ABNORMAL LOW (ref 38.5–50.0)
HEMOGLOBIN: 9.9 g/dL — AB (ref 13.2–17.1)
LYMPHS ABS: 1254 {cells}/uL (ref 850–3900)
Lymphocytes Relative: 22 %
MCH: 31.4 pg (ref 27.0–33.0)
MCHC: 33.7 g/dL (ref 32.0–36.0)
MCV: 93.3 fL (ref 80.0–100.0)
MPV: 8.3 fL (ref 7.5–12.5)
Monocytes Absolute: 627 cells/uL (ref 200–950)
Monocytes Relative: 11 %
NEUTROS ABS: 3249 {cells}/uL (ref 1500–7800)
NEUTROS PCT: 57 %
Platelets: 142 10*3/uL (ref 140–400)
RBC: 3.15 MIL/uL — AB (ref 4.20–5.80)
RDW: 15.8 % — ABNORMAL HIGH (ref 11.0–15.0)
WBC: 5.7 10*3/uL (ref 3.8–10.8)

## 2016-06-11 ENCOUNTER — Ambulatory Visit: Payer: Medicare Other | Admitting: Gastroenterology

## 2016-06-11 DIAGNOSIS — H43811 Vitreous degeneration, right eye: Secondary | ICD-10-CM | POA: Diagnosis not present

## 2016-06-11 DIAGNOSIS — H2513 Age-related nuclear cataract, bilateral: Secondary | ICD-10-CM | POA: Diagnosis not present

## 2016-06-11 DIAGNOSIS — H25013 Cortical age-related cataract, bilateral: Secondary | ICD-10-CM | POA: Diagnosis not present

## 2016-06-11 LAB — COMPLETE METABOLIC PANEL WITH GFR
ALBUMIN: 4 g/dL (ref 3.6–5.1)
ALK PHOS: 39 U/L — AB (ref 40–115)
ALT: 5 U/L — AB (ref 9–46)
AST: 13 U/L (ref 10–35)
BUN: 88 mg/dL — AB (ref 7–25)
CHLORIDE: 97 mmol/L — AB (ref 98–110)
CO2: 21 mmol/L (ref 20–31)
CREATININE: 14.64 mg/dL — AB (ref 0.70–1.25)
Calcium: 9.1 mg/dL (ref 8.6–10.3)
GFR, Est African American: <4 mL/min — ABNORMAL LOW (ref >=60)
GFR, Est Non African American: 3 mL/min — ABNORMAL LOW (ref >=60)
GLUCOSE: 83 mg/dL (ref 65–99)
POTASSIUM: 4.7 mmol/L (ref 3.5–5.3)
SODIUM: 135 mmol/L (ref 135–146)
Total Bilirubin: 0.5 mg/dL (ref 0.2–1.2)
Total Protein: 7.2 g/dL (ref 6.1–8.1)

## 2016-06-11 LAB — T-HELPER CELL (CD4) - (RCID CLINIC ONLY)
CD4 T CELL HELPER: 28 % — AB (ref 33–55)
CD4 T Cell Abs: 360 /uL — ABNORMAL LOW (ref 400–2700)

## 2016-06-12 LAB — HIV-1 RNA QUANT-NO REFLEX-BLD: HIV 1 RNA Quant: <20 copies/mL (ref <20)

## 2016-06-14 DIAGNOSIS — D509 Iron deficiency anemia, unspecified: Secondary | ICD-10-CM | POA: Diagnosis not present

## 2016-06-14 DIAGNOSIS — Z4931 Encounter for adequacy testing for hemodialysis: Secondary | ICD-10-CM | POA: Diagnosis not present

## 2016-06-14 DIAGNOSIS — N186 End stage renal disease: Secondary | ICD-10-CM | POA: Diagnosis not present

## 2016-06-14 DIAGNOSIS — Z79899 Other long term (current) drug therapy: Secondary | ICD-10-CM | POA: Diagnosis not present

## 2016-06-14 DIAGNOSIS — N2581 Secondary hyperparathyroidism of renal origin: Secondary | ICD-10-CM | POA: Diagnosis not present

## 2016-06-14 DIAGNOSIS — D631 Anemia in chronic kidney disease: Secondary | ICD-10-CM | POA: Diagnosis not present

## 2016-06-16 ENCOUNTER — Other Ambulatory Visit: Payer: Self-pay | Admitting: Internal Medicine

## 2016-06-16 DIAGNOSIS — D631 Anemia in chronic kidney disease: Secondary | ICD-10-CM | POA: Diagnosis not present

## 2016-06-16 DIAGNOSIS — N186 End stage renal disease: Secondary | ICD-10-CM | POA: Diagnosis not present

## 2016-06-16 DIAGNOSIS — Z4931 Encounter for adequacy testing for hemodialysis: Secondary | ICD-10-CM | POA: Diagnosis not present

## 2016-06-16 DIAGNOSIS — D509 Iron deficiency anemia, unspecified: Secondary | ICD-10-CM | POA: Diagnosis not present

## 2016-06-16 DIAGNOSIS — Z79899 Other long term (current) drug therapy: Secondary | ICD-10-CM | POA: Diagnosis not present

## 2016-06-16 DIAGNOSIS — N2581 Secondary hyperparathyroidism of renal origin: Secondary | ICD-10-CM | POA: Diagnosis not present

## 2016-06-20 DIAGNOSIS — Z992 Dependence on renal dialysis: Secondary | ICD-10-CM | POA: Diagnosis not present

## 2016-06-20 DIAGNOSIS — D631 Anemia in chronic kidney disease: Secondary | ICD-10-CM | POA: Diagnosis not present

## 2016-06-20 DIAGNOSIS — Z4931 Encounter for adequacy testing for hemodialysis: Secondary | ICD-10-CM | POA: Diagnosis not present

## 2016-06-20 DIAGNOSIS — Z79899 Other long term (current) drug therapy: Secondary | ICD-10-CM | POA: Diagnosis not present

## 2016-06-20 DIAGNOSIS — D509 Iron deficiency anemia, unspecified: Secondary | ICD-10-CM | POA: Diagnosis not present

## 2016-06-20 DIAGNOSIS — N2581 Secondary hyperparathyroidism of renal origin: Secondary | ICD-10-CM | POA: Diagnosis not present

## 2016-06-20 DIAGNOSIS — N186 End stage renal disease: Secondary | ICD-10-CM | POA: Diagnosis not present

## 2016-06-20 DIAGNOSIS — I120 Hypertensive chronic kidney disease with stage 5 chronic kidney disease or end stage renal disease: Secondary | ICD-10-CM | POA: Diagnosis not present

## 2016-06-23 DIAGNOSIS — N186 End stage renal disease: Secondary | ICD-10-CM | POA: Diagnosis not present

## 2016-06-23 DIAGNOSIS — D631 Anemia in chronic kidney disease: Secondary | ICD-10-CM | POA: Diagnosis not present

## 2016-06-23 DIAGNOSIS — E8779 Other fluid overload: Secondary | ICD-10-CM | POA: Diagnosis not present

## 2016-06-23 DIAGNOSIS — N2581 Secondary hyperparathyroidism of renal origin: Secondary | ICD-10-CM | POA: Diagnosis not present

## 2016-06-23 DIAGNOSIS — Z23 Encounter for immunization: Secondary | ICD-10-CM | POA: Diagnosis not present

## 2016-06-23 DIAGNOSIS — D509 Iron deficiency anemia, unspecified: Secondary | ICD-10-CM | POA: Diagnosis not present

## 2016-06-24 ENCOUNTER — Ambulatory Visit (INDEPENDENT_AMBULATORY_CARE_PROVIDER_SITE_OTHER): Payer: Medicare Other | Admitting: Infectious Diseases

## 2016-06-24 ENCOUNTER — Encounter: Payer: Self-pay | Admitting: Infectious Diseases

## 2016-06-24 VITALS — BP 193/91 | HR 75 | Temp 97.7°F | Wt 198.0 lb

## 2016-06-24 DIAGNOSIS — D631 Anemia in chronic kidney disease: Secondary | ICD-10-CM | POA: Diagnosis not present

## 2016-06-24 DIAGNOSIS — B169 Acute hepatitis B without delta-agent and without hepatic coma: Secondary | ICD-10-CM | POA: Diagnosis not present

## 2016-06-24 DIAGNOSIS — I10 Essential (primary) hypertension: Secondary | ICD-10-CM

## 2016-06-24 DIAGNOSIS — B20 Human immunodeficiency virus [HIV] disease: Secondary | ICD-10-CM | POA: Diagnosis present

## 2016-06-24 DIAGNOSIS — Z113 Encounter for screening for infections with a predominantly sexual mode of transmission: Secondary | ICD-10-CM | POA: Diagnosis not present

## 2016-06-24 DIAGNOSIS — N2581 Secondary hyperparathyroidism of renal origin: Secondary | ICD-10-CM | POA: Diagnosis not present

## 2016-06-24 DIAGNOSIS — E8779 Other fluid overload: Secondary | ICD-10-CM | POA: Diagnosis not present

## 2016-06-24 DIAGNOSIS — N186 End stage renal disease: Secondary | ICD-10-CM | POA: Diagnosis not present

## 2016-06-24 DIAGNOSIS — Z79899 Other long term (current) drug therapy: Secondary | ICD-10-CM

## 2016-06-24 DIAGNOSIS — Z23 Encounter for immunization: Secondary | ICD-10-CM | POA: Diagnosis not present

## 2016-06-24 DIAGNOSIS — D509 Iron deficiency anemia, unspecified: Secondary | ICD-10-CM | POA: Diagnosis not present

## 2016-06-24 NOTE — Assessment & Plan Note (Signed)
He wil lf/u with renal, states Bp normal at home.

## 2016-06-24 NOTE — Assessment & Plan Note (Signed)
Doing well with his current art.  Change TFV to TFVd? Offered/refused condoms.  Has gotten flu shot.  Will see him back in 6 months.

## 2016-06-24 NOTE — Assessment & Plan Note (Signed)
HBV DNA negative 03-2016. No change in current rx

## 2016-06-24 NOTE — Assessment & Plan Note (Signed)
Appears to be doing well.  Appreciate renal f/u.  +bruit on exam.

## 2016-06-24 NOTE — Progress Notes (Signed)
   Subjective:    Patient ID: Jeffrey [name], male    DOB: October 22, 1955, 60 y.o.   MRN: [id number]  HPI 60 yo M with HIV+, Hepatitis B, ESRD, HTN. On 3TC/ISN/TFV. No problems with HD at home.  Took BP at home and was 130/x.  Was in hospital x2 this summer- transfusion for Hgb 6.3 and he also needed fistulogram.  Checks his Hgb once a week (9.7 last week) and getting support.   HIV 1 RNA Quant (copies/mL)  Date Value  06/10/2016 <20  04/07/2016 <20  10/14/2015 28 (H)   CD4 T Cell Abs (/uL)  Date Value  06/10/2016 360 (L)  04/07/2016 280 (L)  10/14/2015 520    Review of Systems  Constitutional: Negative for appetite change and unexpected weight change.  Gastrointestinal: Negative for constipation and diarrhea.  makes very little urine.   Has gotten flu shot.  Knows he needs to get colon done.      Objective:   Physical Exam  Constitutional: He appears well-developed and well-nourished.  HENT:  Mouth/Throat: No oropharyngeal exudate.  Eyes: EOM are normal. Pupils are equal, round, and reactive to light.  Neck: Neck supple.  Cardiovascular: Normal rate, regular rhythm and normal heart sounds.   Pulmonary/Chest: Effort normal and breath sounds normal.  Abdominal: Soft. Bowel sounds are normal. There is no tenderness. There is no rebound.  Musculoskeletal: He exhibits no edema.  Lymphadenopathy:    He has no cervical adenopathy.      Assessment & Plan:

## 2016-06-27 DIAGNOSIS — D631 Anemia in chronic kidney disease: Secondary | ICD-10-CM | POA: Diagnosis not present

## 2016-06-27 DIAGNOSIS — Z23 Encounter for immunization: Secondary | ICD-10-CM | POA: Diagnosis not present

## 2016-06-27 DIAGNOSIS — N2581 Secondary hyperparathyroidism of renal origin: Secondary | ICD-10-CM | POA: Diagnosis not present

## 2016-06-27 DIAGNOSIS — D509 Iron deficiency anemia, unspecified: Secondary | ICD-10-CM | POA: Diagnosis not present

## 2016-06-27 DIAGNOSIS — N186 End stage renal disease: Secondary | ICD-10-CM | POA: Diagnosis not present

## 2016-06-27 DIAGNOSIS — E8779 Other fluid overload: Secondary | ICD-10-CM | POA: Diagnosis not present

## 2016-06-29 DIAGNOSIS — D631 Anemia in chronic kidney disease: Secondary | ICD-10-CM | POA: Diagnosis not present

## 2016-06-29 DIAGNOSIS — N2581 Secondary hyperparathyroidism of renal origin: Secondary | ICD-10-CM | POA: Diagnosis not present

## 2016-06-29 DIAGNOSIS — E8779 Other fluid overload: Secondary | ICD-10-CM | POA: Diagnosis not present

## 2016-06-29 DIAGNOSIS — N186 End stage renal disease: Secondary | ICD-10-CM | POA: Diagnosis not present

## 2016-06-29 DIAGNOSIS — Z23 Encounter for immunization: Secondary | ICD-10-CM | POA: Diagnosis not present

## 2016-06-29 DIAGNOSIS — D509 Iron deficiency anemia, unspecified: Secondary | ICD-10-CM | POA: Diagnosis not present

## 2016-07-01 DIAGNOSIS — N2581 Secondary hyperparathyroidism of renal origin: Secondary | ICD-10-CM | POA: Diagnosis not present

## 2016-07-01 DIAGNOSIS — Z23 Encounter for immunization: Secondary | ICD-10-CM | POA: Diagnosis not present

## 2016-07-01 DIAGNOSIS — E8779 Other fluid overload: Secondary | ICD-10-CM | POA: Diagnosis not present

## 2016-07-01 DIAGNOSIS — D509 Iron deficiency anemia, unspecified: Secondary | ICD-10-CM | POA: Diagnosis not present

## 2016-07-01 DIAGNOSIS — N186 End stage renal disease: Secondary | ICD-10-CM | POA: Diagnosis not present

## 2016-07-01 DIAGNOSIS — D631 Anemia in chronic kidney disease: Secondary | ICD-10-CM | POA: Diagnosis not present

## 2016-07-02 DIAGNOSIS — E785 Hyperlipidemia, unspecified: Secondary | ICD-10-CM | POA: Diagnosis not present

## 2016-07-02 DIAGNOSIS — M109 Gout, unspecified: Secondary | ICD-10-CM | POA: Diagnosis not present

## 2016-07-02 DIAGNOSIS — E8779 Other fluid overload: Secondary | ICD-10-CM | POA: Diagnosis not present

## 2016-07-02 DIAGNOSIS — D509 Iron deficiency anemia, unspecified: Secondary | ICD-10-CM | POA: Diagnosis not present

## 2016-07-02 DIAGNOSIS — N186 End stage renal disease: Secondary | ICD-10-CM | POA: Diagnosis not present

## 2016-07-02 DIAGNOSIS — N2581 Secondary hyperparathyroidism of renal origin: Secondary | ICD-10-CM | POA: Diagnosis not present

## 2016-07-02 DIAGNOSIS — D631 Anemia in chronic kidney disease: Secondary | ICD-10-CM | POA: Diagnosis not present

## 2016-07-02 DIAGNOSIS — Z23 Encounter for immunization: Secondary | ICD-10-CM | POA: Diagnosis not present

## 2016-07-03 ENCOUNTER — Other Ambulatory Visit: Payer: Self-pay | Admitting: Internal Medicine

## 2016-07-04 DIAGNOSIS — N186 End stage renal disease: Secondary | ICD-10-CM | POA: Diagnosis not present

## 2016-07-04 DIAGNOSIS — D509 Iron deficiency anemia, unspecified: Secondary | ICD-10-CM | POA: Diagnosis not present

## 2016-07-04 DIAGNOSIS — E8779 Other fluid overload: Secondary | ICD-10-CM | POA: Diagnosis not present

## 2016-07-04 DIAGNOSIS — D631 Anemia in chronic kidney disease: Secondary | ICD-10-CM | POA: Diagnosis not present

## 2016-07-04 DIAGNOSIS — N2581 Secondary hyperparathyroidism of renal origin: Secondary | ICD-10-CM | POA: Diagnosis not present

## 2016-07-04 DIAGNOSIS — Z23 Encounter for immunization: Secondary | ICD-10-CM | POA: Diagnosis not present

## 2016-07-06 DIAGNOSIS — E8779 Other fluid overload: Secondary | ICD-10-CM | POA: Diagnosis not present

## 2016-07-06 DIAGNOSIS — N2581 Secondary hyperparathyroidism of renal origin: Secondary | ICD-10-CM | POA: Diagnosis not present

## 2016-07-06 DIAGNOSIS — D631 Anemia in chronic kidney disease: Secondary | ICD-10-CM | POA: Diagnosis not present

## 2016-07-06 DIAGNOSIS — D509 Iron deficiency anemia, unspecified: Secondary | ICD-10-CM | POA: Diagnosis not present

## 2016-07-06 DIAGNOSIS — N186 End stage renal disease: Secondary | ICD-10-CM | POA: Diagnosis not present

## 2016-07-06 DIAGNOSIS — Z23 Encounter for immunization: Secondary | ICD-10-CM | POA: Diagnosis not present

## 2016-07-07 DIAGNOSIS — D631 Anemia in chronic kidney disease: Secondary | ICD-10-CM | POA: Diagnosis not present

## 2016-07-07 DIAGNOSIS — D509 Iron deficiency anemia, unspecified: Secondary | ICD-10-CM | POA: Diagnosis not present

## 2016-07-07 DIAGNOSIS — N2581 Secondary hyperparathyroidism of renal origin: Secondary | ICD-10-CM | POA: Diagnosis not present

## 2016-07-07 DIAGNOSIS — Z23 Encounter for immunization: Secondary | ICD-10-CM | POA: Diagnosis not present

## 2016-07-07 DIAGNOSIS — N186 End stage renal disease: Secondary | ICD-10-CM | POA: Diagnosis not present

## 2016-07-07 DIAGNOSIS — E8779 Other fluid overload: Secondary | ICD-10-CM | POA: Diagnosis not present

## 2016-07-09 DIAGNOSIS — D631 Anemia in chronic kidney disease: Secondary | ICD-10-CM | POA: Diagnosis not present

## 2016-07-09 DIAGNOSIS — N186 End stage renal disease: Secondary | ICD-10-CM | POA: Diagnosis not present

## 2016-07-09 DIAGNOSIS — E8779 Other fluid overload: Secondary | ICD-10-CM | POA: Diagnosis not present

## 2016-07-09 DIAGNOSIS — D509 Iron deficiency anemia, unspecified: Secondary | ICD-10-CM | POA: Diagnosis not present

## 2016-07-09 DIAGNOSIS — Z23 Encounter for immunization: Secondary | ICD-10-CM | POA: Diagnosis not present

## 2016-07-09 DIAGNOSIS — N2581 Secondary hyperparathyroidism of renal origin: Secondary | ICD-10-CM | POA: Diagnosis not present

## 2016-07-11 DIAGNOSIS — D509 Iron deficiency anemia, unspecified: Secondary | ICD-10-CM | POA: Diagnosis not present

## 2016-07-11 DIAGNOSIS — D631 Anemia in chronic kidney disease: Secondary | ICD-10-CM | POA: Diagnosis not present

## 2016-07-11 DIAGNOSIS — N186 End stage renal disease: Secondary | ICD-10-CM | POA: Diagnosis not present

## 2016-07-11 DIAGNOSIS — N2581 Secondary hyperparathyroidism of renal origin: Secondary | ICD-10-CM | POA: Diagnosis not present

## 2016-07-11 DIAGNOSIS — E8779 Other fluid overload: Secondary | ICD-10-CM | POA: Diagnosis not present

## 2016-07-11 DIAGNOSIS — Z23 Encounter for immunization: Secondary | ICD-10-CM | POA: Diagnosis not present

## 2016-07-12 DIAGNOSIS — Z23 Encounter for immunization: Secondary | ICD-10-CM | POA: Diagnosis not present

## 2016-07-12 DIAGNOSIS — D509 Iron deficiency anemia, unspecified: Secondary | ICD-10-CM | POA: Diagnosis not present

## 2016-07-12 DIAGNOSIS — E8779 Other fluid overload: Secondary | ICD-10-CM | POA: Diagnosis not present

## 2016-07-12 DIAGNOSIS — D631 Anemia in chronic kidney disease: Secondary | ICD-10-CM | POA: Diagnosis not present

## 2016-07-12 DIAGNOSIS — N186 End stage renal disease: Secondary | ICD-10-CM | POA: Diagnosis not present

## 2016-07-12 DIAGNOSIS — N2581 Secondary hyperparathyroidism of renal origin: Secondary | ICD-10-CM | POA: Diagnosis not present

## 2016-07-13 MED FILL — ETHYL CHLORIDE SPRAY: 30 days supply | Qty: 104 | Fill #0

## 2016-07-14 DIAGNOSIS — D509 Iron deficiency anemia, unspecified: Secondary | ICD-10-CM | POA: Diagnosis not present

## 2016-07-14 DIAGNOSIS — Z23 Encounter for immunization: Secondary | ICD-10-CM | POA: Diagnosis not present

## 2016-07-14 DIAGNOSIS — N2581 Secondary hyperparathyroidism of renal origin: Secondary | ICD-10-CM | POA: Diagnosis not present

## 2016-07-14 DIAGNOSIS — N186 End stage renal disease: Secondary | ICD-10-CM | POA: Diagnosis not present

## 2016-07-14 DIAGNOSIS — D631 Anemia in chronic kidney disease: Secondary | ICD-10-CM | POA: Diagnosis not present

## 2016-07-14 DIAGNOSIS — E8779 Other fluid overload: Secondary | ICD-10-CM | POA: Diagnosis not present

## 2016-07-18 DIAGNOSIS — N186 End stage renal disease: Secondary | ICD-10-CM | POA: Diagnosis not present

## 2016-07-18 DIAGNOSIS — D631 Anemia in chronic kidney disease: Secondary | ICD-10-CM | POA: Diagnosis not present

## 2016-07-18 DIAGNOSIS — E8779 Other fluid overload: Secondary | ICD-10-CM | POA: Diagnosis not present

## 2016-07-18 DIAGNOSIS — Z23 Encounter for immunization: Secondary | ICD-10-CM | POA: Diagnosis not present

## 2016-07-18 DIAGNOSIS — D509 Iron deficiency anemia, unspecified: Secondary | ICD-10-CM | POA: Diagnosis not present

## 2016-07-18 DIAGNOSIS — N2581 Secondary hyperparathyroidism of renal origin: Secondary | ICD-10-CM | POA: Diagnosis not present

## 2016-07-21 DIAGNOSIS — E8779 Other fluid overload: Secondary | ICD-10-CM | POA: Diagnosis not present

## 2016-07-21 DIAGNOSIS — N2581 Secondary hyperparathyroidism of renal origin: Secondary | ICD-10-CM | POA: Diagnosis not present

## 2016-07-21 DIAGNOSIS — Z992 Dependence on renal dialysis: Secondary | ICD-10-CM | POA: Diagnosis not present

## 2016-07-21 DIAGNOSIS — D631 Anemia in chronic kidney disease: Secondary | ICD-10-CM | POA: Diagnosis not present

## 2016-07-21 DIAGNOSIS — D509 Iron deficiency anemia, unspecified: Secondary | ICD-10-CM | POA: Diagnosis not present

## 2016-07-21 DIAGNOSIS — I120 Hypertensive chronic kidney disease with stage 5 chronic kidney disease or end stage renal disease: Secondary | ICD-10-CM | POA: Diagnosis not present

## 2016-07-21 DIAGNOSIS — Z23 Encounter for immunization: Secondary | ICD-10-CM | POA: Diagnosis not present

## 2016-07-21 DIAGNOSIS — N186 End stage renal disease: Secondary | ICD-10-CM | POA: Diagnosis not present

## 2016-07-24 ENCOUNTER — Other Ambulatory Visit: Payer: Self-pay | Admitting: Infectious Diseases

## 2016-07-24 DIAGNOSIS — B20 Human immunodeficiency virus [HIV] disease: Secondary | ICD-10-CM

## 2016-07-25 DIAGNOSIS — N186 End stage renal disease: Secondary | ICD-10-CM | POA: Diagnosis not present

## 2016-07-25 DIAGNOSIS — D509 Iron deficiency anemia, unspecified: Secondary | ICD-10-CM | POA: Diagnosis not present

## 2016-07-25 DIAGNOSIS — E8779 Other fluid overload: Secondary | ICD-10-CM | POA: Diagnosis not present

## 2016-07-25 DIAGNOSIS — Z79899 Other long term (current) drug therapy: Secondary | ICD-10-CM | POA: Diagnosis not present

## 2016-07-25 DIAGNOSIS — D631 Anemia in chronic kidney disease: Secondary | ICD-10-CM | POA: Diagnosis not present

## 2016-07-28 DIAGNOSIS — M109 Gout, unspecified: Secondary | ICD-10-CM | POA: Diagnosis not present

## 2016-07-28 DIAGNOSIS — N186 End stage renal disease: Secondary | ICD-10-CM | POA: Diagnosis not present

## 2016-07-28 DIAGNOSIS — D509 Iron deficiency anemia, unspecified: Secondary | ICD-10-CM | POA: Diagnosis not present

## 2016-07-28 DIAGNOSIS — E8779 Other fluid overload: Secondary | ICD-10-CM | POA: Diagnosis not present

## 2016-07-28 DIAGNOSIS — Z79899 Other long term (current) drug therapy: Secondary | ICD-10-CM | POA: Diagnosis not present

## 2016-07-28 DIAGNOSIS — D631 Anemia in chronic kidney disease: Secondary | ICD-10-CM | POA: Diagnosis not present

## 2016-07-29 ENCOUNTER — Emergency Department (HOSPITAL_COMMUNITY)
Admission: EM | Admit: 2016-07-29 | Discharge: 2016-07-29 | Disposition: A | Discharge status: Home / Self Care | Payer: Medicare Other | Attending: Emergency Medicine | Admitting: Emergency Medicine

## 2016-07-29 ENCOUNTER — Emergency Department (HOSPITAL_COMMUNITY): Payer: Medicare Other

## 2016-07-29 ENCOUNTER — Encounter (HOSPITAL_COMMUNITY): Payer: Self-pay | Admitting: Emergency Medicine

## 2016-07-29 DIAGNOSIS — Z87891 Personal history of nicotine dependence: Secondary | ICD-10-CM | POA: Insufficient documentation

## 2016-07-29 DIAGNOSIS — J209 Acute bronchitis, unspecified: Secondary | ICD-10-CM | POA: Insufficient documentation

## 2016-07-29 DIAGNOSIS — R0789 Other chest pain: Secondary | ICD-10-CM | POA: Diagnosis present

## 2016-07-29 DIAGNOSIS — R079 Chest pain, unspecified: Secondary | ICD-10-CM | POA: Diagnosis not present

## 2016-07-29 DIAGNOSIS — I120 Hypertensive chronic kidney disease with stage 5 chronic kidney disease or end stage renal disease: Secondary | ICD-10-CM | POA: Diagnosis not present

## 2016-07-29 DIAGNOSIS — J449 Chronic obstructive pulmonary disease, unspecified: Secondary | ICD-10-CM | POA: Insufficient documentation

## 2016-07-29 DIAGNOSIS — J40 Bronchitis, not specified as acute or chronic: Secondary | ICD-10-CM

## 2016-07-29 DIAGNOSIS — Z79899 Other long term (current) drug therapy: Secondary | ICD-10-CM | POA: Insufficient documentation

## 2016-07-29 DIAGNOSIS — N186 End stage renal disease: Secondary | ICD-10-CM | POA: Diagnosis not present

## 2016-07-29 LAB — CBC
HEMATOCRIT: 24.8 % — AB (ref 39.0–52.0)
Hemoglobin: 8.4 g/dL — ABNORMAL LOW (ref 13.0–17.0)
MCH: 31.1 pg (ref 26.0–34.0)
MCHC: 33.9 g/dL (ref 30.0–36.0)
MCV: 91.9 fL (ref 78.0–100.0)
PLATELETS: 126 10*3/uL — AB (ref 150–400)
RBC: 2.7 MIL/uL — AB (ref 4.22–5.81)
RDW: 15.4 % (ref 11.5–15.5)
WBC: 6 10*3/uL (ref 4.0–10.5)

## 2016-07-29 LAB — BASIC METABOLIC PANEL
ANION GAP: 15 (ref 5–15)
BUN: 69 mg/dL — ABNORMAL HIGH (ref 6–20)
CALCIUM: 9.5 mg/dL (ref 8.9–10.3)
CO2: 23 mmol/L (ref 22–32)
Chloride: 99 mmol/L — ABNORMAL LOW (ref 101–111)
Creatinine, Ser: 14.47 mg/dL — ABNORMAL HIGH (ref 0.61–1.24)
GFR, EST AFRICAN AMERICAN: 4 mL/min — AB (ref >60)
GFR, EST NON AFRICAN AMERICAN: 3 mL/min — AB (ref >60)
Glucose, Bld: 106 mg/dL — ABNORMAL HIGH (ref 65–99)
POTASSIUM: 4.3 mmol/L (ref 3.5–5.1)
Sodium: 137 mmol/L (ref 135–145)

## 2016-07-29 LAB — I-STAT TROPONIN, ED
TROPONIN I, POC: 0.06 ng/mL (ref 0.00–0.08)
Troponin i, poc: 0.05 ng/mL (ref 0.00–0.08)

## 2016-07-29 MED ORDER — ALBUTEROL SULFATE HFA 108 (90 BASE) MCG/ACT IN AERS
2.0000 | INHALATION_SPRAY | RESPIRATORY_TRACT | Status: DC | PRN
  Administered 2016-07-29: 2 via RESPIRATORY_TRACT
  Filled 2016-07-29: qty 6.7

## 2016-07-29 MED ORDER — AZITHROMYCIN 250 MG PO TABS
500.0000 mg | ORAL_TABLET | Freq: Once | ORAL | Status: AC
  Administered 2016-07-29: 500 mg via ORAL
  Filled 2016-07-29: qty 2

## 2016-07-29 MED ORDER — AZITHROMYCIN 250 MG PO TABS: 250.0000 mg | ORAL_TABLET | Freq: Every day | ORAL | 0 refills | Status: DC

## 2016-07-29 MED ORDER — AEROCHAMBER Z-STAT PLUS/MEDIUM MISC
1.0000 | Freq: Once | Status: AC
  Administered 2016-07-29: 1
  Filled 2016-07-29: qty 1

## 2016-07-29 NOTE — ED Notes (Signed)
Pt returned from X-ray.  

## 2016-07-29 NOTE — ED Provider Notes (Signed)
[facility] DEPT Provider Note   CSN: [id number] Arrival date & time: 07/29/16  0848     History   Chief Complaint Chief Complaint  Patient presents with  . Chest Pain  . Arm Pain    HPI Jeffrey [name] is a 60 y.o. male.  Patient with h/o HIV, ESRD, performs home hemodialysis 3 days a week, states he is currently slightly under his dry weight -- presents with complaint of intermittent chest pressure on the left side of his chest over the past several days. Pressure is not related to activity or food. He does not have associated diaphoresis, vomiting. The pain does occasionally radiate to the left shoulder area. Patient has also had a cough productive of yellow mucus which has been ongoing since 1 week ago. No fevers or shortness of breath. Patient does not feel like he has fluid in his lungs or legs. No abdominal pains. No diarrhea. Current episode of chest pressure started approximately 3 AM today. Onset of symptoms acute. Course is constant. Nothing makes symptoms better or worse. No previous cardiac history. Dialyzes through L forearm fistula.       Past Medical History:  Diagnosis Date  . Anemia   . Anxiety   . Arthritis   . Asthma    per pt hx  . End stage renal disease on home HD 07/10/2011   Started HD in September 2012 at [facility] with a tunneled HD catheter, now on home HD with [facility]. Dialyzing through AVF L lower arm with buttonhole technique as of mid 2014. His brother does the HD treatments at home.  They are roommates for 23 years.  The brother works 3rd shift and gets off about 8am and then puts [name] on HD in the morning after getting home. Most of the time he does HD about 4 times a week, for about 4 hours per treatment. Cause of ESRD was HTN according to patient. He says he let his health go and ending up with complications, and that he didn't like seeing doctors in those days.  He says he was diagnosed with severe HTN when he lived in [place] in his 65's.     . Gout   . Hepatitis B carrier ([place])   . HIV infection ([place])   . Hypertension   . Hyperthyroidism   . Pneumonia   . Seizure ([place])   . Seizures ([place]) 06/02/2011   pt denies this  . Thrombocytopenia [facility])     Patient Active Problem List   Diagnosis Date Noted  . Hypervolemia   . Essential hypertension   . HIV disease ([place])   . Symptomatic anemia 04/08/2016  . Nuclear sclerotic cataract of both eyes 12/12/2013  . Generalized pruritus 10/26/2013  . Myopia with presbyopia of both eyes 10/26/2013  . COPD (chronic obstructive pulmonary disease) ([place]) 06/27/2013  . Rash and nonspecific skin eruption 05/08/2013  . Normocytic anemia 01/09/2013  . Thrombocytopenia ([place]) 01/09/2013  . HBV (hepatitis B virus) infection 01/09/2013  . Anxiety 07/22/2011  . End stage renal disease on home HD 07/10/2011  . Human immunodeficiency virus (HIV) disease ([place]) 06/17/2011    Past Surgical History:  Procedure Laterality Date  . AV FISTULA PLACEMENT  06/02/11   Left radiocephalic AVF  . COLONOSCOPY    . OTHER SURGICAL HISTORY     removal temporary HD catheter   . REVISON OF ARTERIOVENOUS FISTULA Left 10/10/2015   Procedure: REVISON OF LEFT RADIOCEPHALIC ARTERIOVENOUS FISTULA;  Surgeon: Angelia Mould,  MD;  Location: [facility];  Service: Vascular;  Laterality: Left;  . REVISON OF ARTERIOVENOUS FISTULA Left 02/07/2016   Procedure: REPAIR OF PSEUDO-ANEUREYSM OF LEFT ARM  ARTERIOVENOUS FISTULA;  Surgeon: Angelia Mould, MD;  Location: [place];  Service: Vascular;  Laterality: Left;  . REVISON OF ARTERIOVENOUS FISTULA Left 1/61/0960   Procedure: PLICATION OF LEFT ARM RADIOCEPHALIC ARTERIOVENOUS FISTULA PSEUDOANEURYSM;  Surgeon: Angelia Mould, MD;  Location: [place];  Service: Vascular;  Laterality: Left;       Home Medications    Prior to Admission medications   Medication Sig Start Date End Date Taking? Authorizing Provider  albuterol (PROVENTIL) (2.5 MG/3ML) 0.083% nebulizer  solution Take 2.5 mg by nebulization every 6 (six) hours as needed for wheezing or shortness of breath.    Historical Provider, MD  amLODipine (NORVASC) 10 MG tablet Take 5 mg by mouth 2 (two) times daily.     Historical Provider, MD  calcitRIOL (ROCALTROL) 0.25 MCG capsule Take 0.25 mcg by mouth daily.    Historical Provider, MD  calcium acetate (PHOSLO) 667 MG capsule Take 1,334-2,001 mg by mouth 3 (three) times daily with meals. 2 capsules with Lunch and 3 capsules with Dinner 06/06/15   Historical Provider, MD  carvedilol (COREG) 25 MG tablet Take 25 mg by mouth 2 (two) times daily with a meal.    Historical Provider, MD  cloNIDine (CATAPRES) 0.1 MG tablet Take 0.1 mg by mouth as needed (as needed related to hypertension while on hemodialysis).  06/10/15   Historical Provider, MD  COMBIVENT RESPIMAT 20-100 MCG/ACT AERS respimat INHALE 1 PUFF BY MOUTH EVERY 4 HOURS AS NEEDED FOR WHEEZING OR SHORTNESS OF BREATH 06/09/16   Ophelia Shoulder, MD  EPIVIR HBV 5 MG/ML solution TAKE 5 ML BY MOUTH ONCE DAILY 10/01/15   Thayer Headings, MD  EPIVIR HBV 5 MG/ML solution TAKE 5 ML BY MOUTH EVERY DAY 07/24/16   Campbell Riches, MD  ethyl chloride spray Apply 1 application topically daily as needed. At HD 05/20/15   Historical Provider, MD  hydrALAZINE (APRESOLINE) 50 MG tablet TAKE 1 TABLET BY MOUTH EVERY 8 HOURS 06/18/16   Jessica Ratliff Hoffman, DO  ISENTRESS 400 MG tablet TAKE 1 TABLET(400 MG) BY MOUTH TWICE DAILY 01/27/16   Campbell Riches, MD  multivitamin (RENA-VIT) TABS tablet Take 1 tablet by mouth daily.      Historical Provider, MD  oxyCODONE-acetaminophen (PERCOCET/ROXICET) 5-325 MG tablet Take 1 tablet by mouth every 8 (eight) hours as needed for severe pain. 04/10/16   Valinda Party, DO  tenofovir (VIREAD) 300 MG tablet Take 1 tablet (300 mg total) by mouth every Saturday. After dialysis on Saturday 01/27/16   Campbell Riches, MD  traZODone (DESYREL) 50 MG tablet Take 50 mg by mouth at bedtime as  needed for sleep.    Historical Provider, MD    Family History Family History  Problem Relation Age of Onset  . Hypertension Mother   . Cancer Father   . Diabetes Mother     Social History Social History  Substance Use Topics  . Smoking status: Former Smoker    Types: Cigarettes    Quit date: 08/08/2015  . Smokeless tobacco: Never Used  . Alcohol use No     Allergies   Lisinopril and Penicillins   Review of Systems Review of Systems  Constitutional: Negative for diaphoresis and fever.  HENT: Negative for rhinorrhea and sore throat.   Eyes: Negative for redness.  Respiratory: Positive  for cough. Negative for shortness of breath and wheezing.   Cardiovascular: Positive for chest pain. Negative for palpitations and leg swelling.  Gastrointestinal: Negative for abdominal pain, diarrhea, nausea and vomiting.  Genitourinary: Negative for dysuria.  Musculoskeletal: Negative for back pain, myalgias and neck pain.  Skin: Negative for rash.  Neurological: Negative for syncope, light-headedness and headaches.  Psychiatric/Behavioral: The patient is not nervous/anxious.      Physical Exam Updated Vital Signs BP (!) 192/101 (BP Location: Right Arm)   Pulse 92   Temp 97.9 F (36.6 C) (Oral)   Resp 18   SpO2 100%   Physical Exam  Constitutional: He appears well-developed and well-nourished.  HENT:  Head: Normocephalic and atraumatic.  Mouth/Throat: Mucous membranes are normal. Mucous membranes are not dry.  Eyes: Conjunctivae are normal.  Neck: Trachea normal and normal range of motion. Neck supple. Normal carotid pulses and no JVD present. No muscular tenderness present. Carotid bruit is not present. No tracheal deviation present.  Cardiovascular: Normal rate, regular rhythm, S1 normal, S2 normal, normal heart sounds and intact distal pulses.  Exam reveals no distant heart sounds and no decreased pulses.   No murmur heard. Pulmonary/Chest: Effort normal. No respiratory  distress. He has no wheezes. He has rales (bilateral bases). He exhibits no tenderness.  Abdominal: Soft. Normal aorta and bowel sounds are normal. There is no tenderness. There is no rebound and no guarding.  Musculoskeletal: He exhibits no edema.  L forearm fistula with known aneurysm  Neurological: He is alert.  Skin: Skin is warm and dry. He is not diaphoretic. No cyanosis. No pallor.  Psychiatric: He has a normal mood and affect.  Nursing note and vitals reviewed.    ED Treatments / Results  Labs (all labs ordered are listed, but only abnormal results are displayed) Labs Reviewed  CBC - Abnormal; Notable for the following:       Result Value   RBC 2.70 (*)    Hemoglobin 8.4 (*)    HCT 24.8 (*)    Platelets 126 (*)    All other components within normal limits  BASIC METABOLIC PANEL - Abnormal; Notable for the following:    Chloride 99 (*)    Glucose, Bld 106 (*)    BUN 69 (*)    Creatinine, Ser 14.47 (*)    GFR calc non Af Amer 3 (*)    GFR calc Af Amer 4 (*)    All other components within normal limits  I-STAT TROPOININ, ED  I-STAT TROPOININ, ED    EKG  EKG Interpretation  Date/Time:  Wednesday July 29 2016 12:43:53 EST Ventricular Rate:  91 PR Interval:    QRS Duration: 104 QT Interval:  405 QTC Calculation: 499 R Axis:   40 Text Interpretation:  Sinus rhythm Prolonged PR interval Probable left atrial enlargement Borderline prolonged QT interval agree. no sig change c/w old Confirmed by Johnney Killian, MD, Jeannie Done [phone]) on 07/29/2016 3:13:55 PM       Radiology Dg Chest 2 View  Result Date: 07/29/2016 CLINICAL DATA:  Chest pain for 1 week, history of hypertension EXAM: CHEST  2 VIEW COMPARISON:  04/08/2016 FINDINGS: Cardiac shadow is enlarged but stable. Stable interstitial changes are again noted bilaterally. Some chronic markings in the bases bilaterally greater on the right are seen but stable from previous exams. No new focal infiltrate is seen. No bony  abnormality is noted. IMPRESSION: Chronic bibasilar changes likely related to scarring and fibrosis. No definitive acute infiltrate is seen. Electronically  Signed   By: Inez Catalina M.D.   On: 07/29/2016 09:57    Procedures Procedures (including critical care time)  Medications Ordered in ED Medications  albuterol (PROVENTIL HFA;VENTOLIN HFA) 108 (90 Base) MCG/ACT inhaler 2 puff (2 puffs Inhalation Given 07/29/16 1500)  azithromycin (ZITHROMAX) tablet 500 mg (500 mg Oral Given 07/29/16 1254)  aerochamber Z-Stat Plus/medium 1 each (1 each Other Given 07/29/16 1512)     Initial Impression / Assessment and Plan / ED Course  I have reviewed the triage vital signs and the nursing notes.  Pertinent labs & imaging results that were available during my care of the patient were reviewed by me and considered in my medical decision making (see chart for details).  Clinical Course    Patient seen and examined. EKG reviewed. Work-up initiated.    Vital signs reviewed and are as follows: BP 181/98   Pulse 84   Temp 97.9 F (36.6 C) (Oral)   Resp 20   SpO2 97%   ED ECG REPORT   Date: 07/29/2016  Rate: 91  Rhythm: normal sinus rhythm  QRS Axis: normal  Intervals: normal  ST/T Wave abnormalities: normal  Conduction Disutrbances:none  Narrative Interpretation: occasional PVC  Old EKG Reviewed: changes noted, no previous PVCs otherwise unchanged  I have personally reviewed the EKG tracing and agree with the computerized printout as noted.  Patient d/w and seen by Dr. Johnney Killian. Feel that chest pain and tightness is likely related to his respiratory ailment. Will check three-hour troponin and repeat EKG. Will give azithromycin and albuterol to treat bronchitis. Patient updated.  I discussed all results with patient and brother now bedside. Encouraged return with worsening shortness of breath or difficulty breathing. Patient was counseled to return with severe chest pain, especially if the pain  is crushing or pressure-like and spreads to the arms, back, neck, or jaw, or if they have sweating, nausea, or shortness of breath with the pain. They were encouraged to call 911 with these symptoms.   They were also told to return if their chest pain gets worse and does not go away with rest, they have an attack of chest pain lasting longer than usual despite rest and treatment with the medications their caregiver has prescribed, if they wake from sleep with chest pain or shortness of breath, if they feel dizzy or faint, if they have chest pain not typical of their usual pain, or if they have any other emergent concerns regarding their health.  The patient verbalized understanding and agreed.   Patient counseled on use of albuterol HFA. Instructed to use 1-2 puffs q 4 hours as needed for SOB.    Final Clinical Impressions(s) / ED Diagnoses   Final diagnoses:  Bronchitis  Chest pain, unspecified type   Patient with cough and chest pain. HEART=3. Troponin and EKG without ischemic change. Pain is not exertional and not associated with diaphoresis or vomiting. No signs and symptoms of DVT/PE. Labs reassuring. Chest x-ray with pneumonia. Will treat as bronchitis. Return instructions as above. Patient does not appear to be fluid overloaded.   ESRD: Hgb baseline -- receives injections for these. Potassium is normal. No fluid overload on x-ray.   New Prescriptions Discharge Medication List as of 07/29/2016  2:51 PM    START taking these medications   Details  azithromycin (ZITHROMAX) 250 MG tablet Take 1 tablet (250 mg total) by mouth daily., Starting Wed 07/29/2016, Print         Carlisle Cater, PA-C 07/29/16  [place], MD 07/30/16 1034

## 2016-07-29 NOTE — ED Notes (Signed)
Pt asleep at this time. Remains on the  Monitor.

## 2016-07-29 NOTE — ED Notes (Signed)
Provider at the bedside.  

## 2016-07-29 NOTE — ED Triage Notes (Signed)
Pt sts left sided CP and pain in left arm; pt sts productive cough; pt dialysis with last treatment was yesterday

## 2016-07-29 NOTE — ED Notes (Signed)
Pt taken to Xray.

## 2016-07-29 NOTE — ED Notes (Signed)
Josh, PA at the bedside

## 2016-07-29 NOTE — Discharge Instructions (Signed)
Please read and follow all provided instructions.  Your diagnoses today include:  1. Bronchitis   2. Chest pain, unspecified type     Tests performed today include:  An EKG of your heart  A chest x-ray  Cardiac enzymes - a blood test for heart muscle damage  Blood counts and electrolytes  Vital signs. See below for your results today.   Medications prescribed:   Azithromycin - antibiotic for respiratory infection  You have been prescribed an antibiotic medicine: take the entire course of medicine even if you are feeling better. Stopping early can cause the antibiotic not to work.   Albuterol inhaler - medication that opens up your airway  Use inhaler as follows: 1-2 puffs with spacer every 4 hours as needed for wheezing, cough, or shortness of breath.   Take any prescribed medications only as directed.  Follow-up instructions: Please follow-up with your primary care provider as soon as you can for further evaluation of your symptoms.   Return instructions:  SEEK IMMEDIATE MEDICAL ATTENTION IF:  You have severe chest pain, especially if the pain is crushing or pressure-like and spreads to the arms, back, neck, or jaw, or if you have sweating, nausea (feeling sick to your stomach), or shortness of breath. THIS IS AN EMERGENCY. Don't wait to see if the pain will go away. Get medical help at once. Call 911 or [phone] (operator). DO NOT drive yourself to the hospital.   Your chest pain gets worse and does not go away with rest.   You have an attack of chest pain lasting longer than usual, despite rest and treatment with the medications your caregiver has prescribed.   You wake from sleep with chest pain or shortness of breath.  You feel dizzy or faint.  You have chest pain not typical of your usual pain for which you originally saw your caregiver.   You have any other emergent concerns regarding your health.  Additional Information: Chest pain comes from many different  causes. Your caregiver has diagnosed you as having chest pain that is not specific for one problem, but does not require admission.  You are at low risk for an acute heart condition or other serious illness.   Your vital signs today were: BP 146/71    Pulse 78    Temp 97.1 F (36.2 C) (Oral)    Resp 23    SpO2 98%  If your blood pressure (BP) was elevated above 135/85 this visit, please have this repeated by your doctor within one month. --------------

## 2016-08-01 ENCOUNTER — Other Ambulatory Visit: Payer: Self-pay | Admitting: Infectious Diseases

## 2016-08-01 DIAGNOSIS — D509 Iron deficiency anemia, unspecified: Secondary | ICD-10-CM | POA: Diagnosis not present

## 2016-08-01 DIAGNOSIS — Z79899 Other long term (current) drug therapy: Secondary | ICD-10-CM | POA: Diagnosis not present

## 2016-08-01 DIAGNOSIS — N186 End stage renal disease: Secondary | ICD-10-CM | POA: Diagnosis not present

## 2016-08-01 DIAGNOSIS — E8779 Other fluid overload: Secondary | ICD-10-CM | POA: Diagnosis not present

## 2016-08-01 DIAGNOSIS — D631 Anemia in chronic kidney disease: Secondary | ICD-10-CM | POA: Diagnosis not present

## 2016-08-03 ENCOUNTER — Emergency Department (HOSPITAL_COMMUNITY): Payer: Medicare Other

## 2016-08-03 ENCOUNTER — Inpatient Hospital Stay (HOSPITAL_COMMUNITY)
Admission: EM | Admit: 2016-08-03 | Discharge: 2016-08-08 | DRG: 871 | Disposition: A | Discharge status: Home Health | Payer: Medicare Other | Attending: Internal Medicine | Admitting: Internal Medicine

## 2016-08-03 ENCOUNTER — Encounter (HOSPITAL_COMMUNITY): Payer: Self-pay

## 2016-08-03 DIAGNOSIS — D631 Anemia in chronic kidney disease: Secondary | ICD-10-CM | POA: Diagnosis present

## 2016-08-03 DIAGNOSIS — I10 Essential (primary) hypertension: Secondary | ICD-10-CM | POA: Diagnosis not present

## 2016-08-03 DIAGNOSIS — B20 Human immunodeficiency virus [HIV] disease: Secondary | ICD-10-CM | POA: Diagnosis not present

## 2016-08-03 DIAGNOSIS — N186 End stage renal disease: Secondary | ICD-10-CM | POA: Diagnosis not present

## 2016-08-03 DIAGNOSIS — M47817 Spondylosis without myelopathy or radiculopathy, lumbosacral region: Secondary | ICD-10-CM | POA: Diagnosis present

## 2016-08-03 DIAGNOSIS — M545 Low back pain, unspecified: Secondary | ICD-10-CM

## 2016-08-03 DIAGNOSIS — R079 Chest pain, unspecified: Secondary | ICD-10-CM | POA: Diagnosis not present

## 2016-08-03 DIAGNOSIS — J45909 Unspecified asthma, uncomplicated: Secondary | ICD-10-CM | POA: Diagnosis present

## 2016-08-03 DIAGNOSIS — Z992 Dependence on renal dialysis: Secondary | ICD-10-CM

## 2016-08-03 DIAGNOSIS — M47816 Spondylosis without myelopathy or radiculopathy, lumbar region: Secondary | ICD-10-CM | POA: Diagnosis present

## 2016-08-03 DIAGNOSIS — M48061 Spinal stenosis, lumbar region without neurogenic claudication: Secondary | ICD-10-CM | POA: Diagnosis present

## 2016-08-03 DIAGNOSIS — M5489 Other dorsalgia: Secondary | ICD-10-CM | POA: Diagnosis not present

## 2016-08-03 DIAGNOSIS — J209 Acute bronchitis, unspecified: Secondary | ICD-10-CM | POA: Diagnosis present

## 2016-08-03 DIAGNOSIS — R52 Pain, unspecified: Secondary | ICD-10-CM | POA: Diagnosis not present

## 2016-08-03 DIAGNOSIS — I330 Acute and subacute infective endocarditis: Secondary | ICD-10-CM | POA: Diagnosis not present

## 2016-08-03 DIAGNOSIS — I120 Hypertensive chronic kidney disease with stage 5 chronic kidney disease or end stage renal disease: Secondary | ICD-10-CM | POA: Diagnosis not present

## 2016-08-03 DIAGNOSIS — J189 Pneumonia, unspecified organism: Secondary | ICD-10-CM | POA: Diagnosis not present

## 2016-08-03 DIAGNOSIS — N2581 Secondary hyperparathyroidism of renal origin: Secondary | ICD-10-CM | POA: Diagnosis present

## 2016-08-03 DIAGNOSIS — R918 Other nonspecific abnormal finding of lung field: Secondary | ICD-10-CM | POA: Diagnosis present

## 2016-08-03 DIAGNOSIS — R7881 Bacteremia: Principal | ICD-10-CM | POA: Diagnosis present

## 2016-08-03 DIAGNOSIS — M549 Dorsalgia, unspecified: Secondary | ICD-10-CM

## 2016-08-03 DIAGNOSIS — D649 Anemia, unspecified: Secondary | ICD-10-CM | POA: Diagnosis not present

## 2016-08-03 DIAGNOSIS — R05 Cough: Secondary | ICD-10-CM | POA: Diagnosis not present

## 2016-08-03 DIAGNOSIS — M898X9 Other specified disorders of bone, unspecified site: Secondary | ICD-10-CM | POA: Diagnosis present

## 2016-08-03 DIAGNOSIS — Z79899 Other long term (current) drug therapy: Secondary | ICD-10-CM

## 2016-08-03 DIAGNOSIS — B957 Other staphylococcus as the cause of diseases classified elsewhere: Secondary | ICD-10-CM | POA: Diagnosis present

## 2016-08-03 DIAGNOSIS — B181 Chronic viral hepatitis B without delta-agent: Secondary | ICD-10-CM | POA: Diagnosis present

## 2016-08-03 DIAGNOSIS — Z8249 Family history of ischemic heart disease and other diseases of the circulatory system: Secondary | ICD-10-CM

## 2016-08-03 DIAGNOSIS — B199 Unspecified viral hepatitis without hepatic coma: Secondary | ICD-10-CM | POA: Diagnosis not present

## 2016-08-03 DIAGNOSIS — Z880 Allergy status to penicillin: Secondary | ICD-10-CM

## 2016-08-03 DIAGNOSIS — D638 Anemia in other chronic diseases classified elsewhere: Secondary | ICD-10-CM | POA: Diagnosis present

## 2016-08-03 DIAGNOSIS — Z888 Allergy status to other drugs, medicaments and biological substances status: Secondary | ICD-10-CM

## 2016-08-03 DIAGNOSIS — M62838 Other muscle spasm: Secondary | ICD-10-CM | POA: Diagnosis present

## 2016-08-03 DIAGNOSIS — M4800 Spinal stenosis, site unspecified: Secondary | ICD-10-CM | POA: Diagnosis present

## 2016-08-03 LAB — CBC WITH DIFFERENTIAL/PLATELET
BASOS PCT: 0 %
Basophils Absolute: 0 10*3/uL (ref 0.0–0.1)
EOS PCT: 2 %
Eosinophils Absolute: 0.3 10*3/uL (ref 0.0–0.7)
HCT: 18.9 % — ABNORMAL LOW (ref 39.0–52.0)
HEMOGLOBIN: 6.7 g/dL — AB (ref 13.0–17.0)
LYMPHS PCT: 7 %
Lymphs Abs: 1 10*3/uL (ref 0.7–4.0)
MCH: 34 pg (ref 26.0–34.0)
MCHC: 35.4 g/dL (ref 30.0–36.0)
MCV: 95.9 fL (ref 78.0–100.0)
MONO ABS: 0.9 10*3/uL (ref 0.1–1.0)
Monocytes Relative: 6 %
NEUTROS PCT: 85 %
Neutro Abs: 12.1 10*3/uL — ABNORMAL HIGH (ref 1.7–7.7)
PLATELETS: 162 10*3/uL (ref 150–400)
RBC: 1.97 MIL/uL — AB (ref 4.22–5.81)
RDW: 16.9 % — ABNORMAL HIGH (ref 11.5–15.5)
WBC: 14.3 10*3/uL — AB (ref 4.0–10.5)

## 2016-08-03 LAB — BASIC METABOLIC PANEL
ANION GAP: 14 (ref 5–15)
BUN: 81 mg/dL — ABNORMAL HIGH (ref 6–20)
CHLORIDE: 97 mmol/L — AB (ref 101–111)
CO2: 23 mmol/L (ref 22–32)
Calcium: 9.5 mg/dL (ref 8.9–10.3)
Creatinine, Ser: 13.65 mg/dL — ABNORMAL HIGH (ref 0.61–1.24)
GFR calc non Af Amer: 3 mL/min — ABNORMAL LOW (ref >60)
GFR, EST AFRICAN AMERICAN: 4 mL/min — AB (ref >60)
Glucose, Bld: 92 mg/dL (ref 65–99)
POTASSIUM: 4.6 mmol/L (ref 3.5–5.1)
SODIUM: 134 mmol/L — AB (ref 135–145)

## 2016-08-03 LAB — POC OCCULT BLOOD, ED: FECAL OCCULT BLD: NEGATIVE

## 2016-08-03 LAB — PREPARE RBC (CROSSMATCH)

## 2016-08-03 LAB — ABO/RH: ABO/RH(D): O NEG

## 2016-08-03 MED ORDER — TRAZODONE HCL 50 MG PO TABS
50.0000 mg | ORAL_TABLET | Freq: Every evening | ORAL | Status: DC | PRN
  Administered 2016-08-04 – 2016-08-08 (×3): 50 mg via ORAL
  Filled 2016-08-03 (×3): qty 1

## 2016-08-03 MED ORDER — RALTEGRAVIR POTASSIUM 400 MG PO TABS
400.0000 mg | ORAL_TABLET | Freq: Two times a day (BID) | ORAL | Status: DC
  Administered 2016-08-04 – 2016-08-08 (×10): 400 mg via ORAL
  Filled 2016-08-03 (×10): qty 1

## 2016-08-03 MED ORDER — TENOFOVIR DISOPROXIL FUMARATE 300 MG PO TABS
300.0000 mg | ORAL_TABLET | ORAL | Status: DC
  Administered 2016-08-08: 300 mg via ORAL
  Filled 2016-08-03: qty 1

## 2016-08-03 MED ORDER — METHOCARBAMOL 500 MG PO TABS: 500.0000 mg | ORAL_TABLET | Freq: Four times a day (QID) | ORAL | Status: DC | PRN

## 2016-08-03 MED ORDER — CLONIDINE HCL 0.1 MG PO TABS: 0.1000 mg | ORAL_TABLET | Freq: Every day | ORAL | Status: DC | PRN

## 2016-08-03 MED ORDER — LAMIVUDINE 10 MG/ML PO SOLN
25.0000 mg | Freq: Every day | ORAL | Status: DC
  Administered 2016-08-04 – 2016-08-08 (×5): 25 mg via ORAL
  Filled 2016-08-03 (×5): qty 5

## 2016-08-03 MED ORDER — FENTANYL CITRATE (PF) 100 MCG/2ML IJ SOLN
50.0000 ug | INTRAMUSCULAR | Status: AC | PRN
  Administered 2016-08-03 – 2016-08-04 (×2): 50 ug via NASAL
  Filled 2016-08-03 (×2): qty 2

## 2016-08-03 MED ORDER — HYDROMORPHONE HCL 1 MG/ML IJ SOLN
1.0000 mg | Freq: Once | INTRAMUSCULAR | Status: AC
  Administered 2016-08-03: 1 mg via INTRAVENOUS
  Filled 2016-08-03: qty 1

## 2016-08-03 MED ORDER — HEPARIN SODIUM (PORCINE) 5000 UNIT/ML IJ SOLN
5000.0000 [IU] | Freq: Three times a day (TID) | INTRAMUSCULAR | Status: DC
  Administered 2016-08-04 – 2016-08-08 (×12): 5000 [IU] via SUBCUTANEOUS
  Filled 2016-08-03 (×12): qty 1

## 2016-08-03 MED ORDER — HYDRALAZINE HCL 50 MG PO TABS
100.0000 mg | ORAL_TABLET | Freq: Three times a day (TID) | ORAL | Status: DC
  Administered 2016-08-04 – 2016-08-08 (×13): 100 mg via ORAL
  Filled 2016-08-03 (×13): qty 2

## 2016-08-03 MED ORDER — OXYCODONE-ACETAMINOPHEN 5-325 MG PO TABS
1.0000 | ORAL_TABLET | Freq: Four times a day (QID) | ORAL | Status: DC | PRN
  Administered 2016-08-04 – 2016-08-05 (×5): 1 via ORAL
  Filled 2016-08-03 (×6): qty 1

## 2016-08-03 MED ORDER — RENA-VITE PO TABS
1.0000 | ORAL_TABLET | Freq: Every day | ORAL | Status: DC
  Administered 2016-08-04: 1 via ORAL
  Filled 2016-08-03: qty 1

## 2016-08-03 MED ORDER — SODIUM CHLORIDE 0.9 % IV SOLN: Freq: Once | INTRAVENOUS | Status: DC

## 2016-08-03 MED ORDER — ALBUTEROL SULFATE (2.5 MG/3ML) 0.083% IN NEBU: 2.5000 mg | INHALATION_SOLUTION | Freq: Four times a day (QID) | RESPIRATORY_TRACT | Status: DC | PRN

## 2016-08-03 MED ORDER — CARVEDILOL 25 MG PO TABS
25.0000 mg | ORAL_TABLET | Freq: Two times a day (BID) | ORAL | Status: DC
  Administered 2016-08-04 – 2016-08-08 (×9): 25 mg via ORAL
  Filled 2016-08-03 (×9): qty 1

## 2016-08-03 MED ORDER — METHOCARBAMOL 500 MG PO TABS
500.0000 mg | ORAL_TABLET | Freq: Four times a day (QID) | ORAL | Status: DC
  Administered 2016-08-04: 500 mg via ORAL
  Filled 2016-08-03: qty 1

## 2016-08-03 MED ORDER — AMLODIPINE BESYLATE 5 MG PO TABS
5.0000 mg | ORAL_TABLET | Freq: Two times a day (BID) | ORAL | Status: DC
  Administered 2016-08-04 – 2016-08-08 (×9): 5 mg via ORAL
  Filled 2016-08-03 (×9): qty 1

## 2016-08-03 MED ORDER — IPRATROPIUM-ALBUTEROL 0.5-2.5 (3) MG/3ML IN SOLN
3.0000 mL | Freq: Four times a day (QID) | RESPIRATORY_TRACT | Status: DC | PRN
  Administered 2016-08-04 – 2016-08-05 (×2): 3 mL via RESPIRATORY_TRACT
  Filled 2016-08-03 (×2): qty 3

## 2016-08-03 MED ORDER — CALCIUM ACETATE (PHOS BINDER) 667 MG PO CAPS
1334.0000 mg | ORAL_CAPSULE | Freq: Every day | ORAL | Status: DC
  Administered 2016-08-05 – 2016-08-08 (×4): 1334 mg via ORAL
  Filled 2016-08-03 (×4): qty 2

## 2016-08-03 NOTE — ED Provider Notes (Signed)
[facility] DEPT Provider Note   CSN: [id number] Arrival date & time: 08/03/16  1741     History   Chief Complaint Chief Complaint  Patient presents with  . Back Pain    HPI SENDER RUEB is a 60 y.o. male.  HPI  59 year old male with end-stage renal disease on home hemodialysis as well as with HIV and recent bronchitis presents with severe low back pain. He's been taking oxycodone and Aleve without relief. He is Carly on oxycodone for his fistula. He's been having cough for about 2 weeks and over the last 1 week has become productive of sputum. Was seen here on 11/8 and given azithromycin. Cough is essentially unchanged but his low back has gone from hurting when coughing to now continuous severe pain. No abdominal pain. No weakness or numbness in his extremities. No incontinence. Last did dialysis last night.  Past Medical History:  Diagnosis Date  . Anemia   . Anxiety   . Arthritis   . Asthma    per pt hx  . End stage renal disease on home HD 07/10/2011   Started HD in September 2012 at [facility] with a tunneled HD catheter, now on home HD with N[facility]Stage. Dialyzing through AVF L lower arm with buttonhole technique as of mid 2014. His brother does the HD treatments at home.  They are roommates for 23 years.  The brother works 3rd shift and gets off about 8am and then puts Mr Worthington on HD in the morning after getting home. Most of the time he does HD about 4 times a week, for about 4 hours per treatment. Cause of ESRD was HTN according to patient. He says he let his health go and ending up with complications, and that he didn't like seeing doctors in those days.  He says he was diagnosed with severe HTN when he lived in [place] in his 15's.   . Gout   . Hepatitis B carrier ([place])   . HIV infection ([place])   . Hypertension   . Hyperthyroidism   . Pneumonia   . Seizure ([place])   . Seizures ([place]) 06/02/2011   pt denies this  . Thrombocytopenia [facility])     Patient Active Problem List     Diagnosis Date Noted  . Back pain 08/03/2016  . Pulmonary nodules 08/03/2016  . Hypervolemia   . Essential hypertension   . HIV disease ([place])   . Symptomatic anemia 04/08/2016  . Nuclear sclerotic cataract of both eyes 12/12/2013  . Generalized pruritus 10/26/2013  . Myopia with presbyopia of both eyes 10/26/2013  . COPD (chronic obstructive pulmonary disease) ([place]) 06/27/2013  . Rash and nonspecific skin eruption 05/08/2013  . Normocytic anemia 01/09/2013  . Thrombocytopenia ([place]) 01/09/2013  . HBV (hepatitis B virus) infection 01/09/2013  . Anxiety 07/22/2011  . End stage renal disease on home HD 07/10/2011  . Human immunodeficiency virus (HIV) disease ([place]) 06/17/2011    Past Surgical History:  Procedure Laterality Date  . AV FISTULA PLACEMENT  06/02/11   Left radiocephalic AVF  . COLONOSCOPY    . OTHER SURGICAL HISTORY     removal temporary HD catheter   . REVISON OF ARTERIOVENOUS FISTULA Left 10/10/2015   Procedure: REVISON OF LEFT RADIOCEPHALIC ARTERIOVENOUS FISTULA;  Surgeon: Angelia Mould, MD;  Location: [facility];  Service: Vascular;  Laterality: Left;  . REVISON OF ARTERIOVENOUS FISTULA Left 02/07/2016   Procedure: REPAIR OF PSEUDO-ANEUREYSM OF LEFT ARM  ARTERIOVENOUS FISTULA;  Surgeon: Judeth Cornfield  Scot Dock, MD;  Location: [facility];  Service: Vascular;  Laterality: Left;  . REVISON OF ARTERIOVENOUS FISTULA Left 1/94/1740   Procedure: PLICATION OF LEFT ARM RADIOCEPHALIC ARTERIOVENOUS FISTULA PSEUDOANEURYSM;  Surgeon: Angelia Mould, MD;  Location: [facility];  Service: Vascular;  Laterality: Left;       Home Medications    Prior to Admission medications   Medication Sig Start Date End Date Taking? Authorizing Provider  albuterol (PROVENTIL) (2.5 MG/3ML) 0.083% nebulizer solution Take 2.5 mg by nebulization every 6 (six) hours as needed for wheezing or shortness of breath.   Yes Historical Provider, MD  amLODipine (NORVASC) 10 MG tablet Take 5 mg by mouth 2  (two) times daily.    Yes Historical Provider, MD  calcium acetate (PHOSLO) 667 MG capsule Take 1,334-2,001 mg by mouth 3 (three) times daily with meals. 2 capsules with Lunch and 3 capsules with Dinner 06/06/15  Yes Historical Provider, MD  carvedilol (COREG) 25 MG tablet Take 25 mg by mouth 2 (two) times daily with a meal.   Yes Historical Provider, MD  cloNIDine (CATAPRES) 0.1 MG tablet Take 0.1 mg by mouth as needed (as needed related to hypertension while on hemodialysis).  06/10/15  Yes Historical Provider, MD  COMBIVENT RESPIMAT 20-100 MCG/ACT AERS respimat INHALE 1 PUFF BY MOUTH EVERY 4 HOURS AS NEEDED FOR WHEEZING OR SHORTNESS OF BREATH 06/09/16  Yes Ophelia Shoulder, MD  EPIVIR HBV 5 MG/ML solution TAKE 5 ML BY MOUTH ONCE DAILY 10/01/15  Yes Thayer Headings, MD  hydrALAZINE (APRESOLINE) 100 MG tablet Take 100 mg by mouth 3 (three) times daily.  08/02/16  Yes Historical Provider, MD  ISENTRESS 400 MG tablet TAKE 1 TABLET(400 MG) BY MOUTH TWICE DAILY 08/03/16  Yes Campbell Riches, MD  multivitamin (RENA-VIT) TABS tablet Take 1 tablet by mouth daily.     Yes Historical Provider, MD  traZODone (DESYREL) 50 MG tablet Take 50 mg by mouth at bedtime as needed for sleep.   Yes Historical Provider, MD  ethyl chloride spray Apply 1 application topically daily as needed. At HD 05/20/15   Historical Provider, MD  tenofovir (VIREAD) 300 MG tablet Take 1 tablet (300 mg total) by mouth every Saturday. After dialysis on Saturday 01/27/16   Campbell Riches, MD    Family History Family History  Problem Relation Age of Onset  . Hypertension Mother   . Diabetes Mother   . Cancer Father     Social History Social History  Substance Use Topics  . Smoking status: Former Smoker    Types: Cigarettes    Quit date: 08/08/2015  . Smokeless tobacco: Never Used  . Alcohol use No     Allergies   Lisinopril and Penicillins   Review of Systems Review of Systems  Constitutional: Negative for fever.    Respiratory: Positive for cough. Negative for shortness of breath.   Cardiovascular: Negative for chest pain.  Gastrointestinal: Negative for abdominal pain.  Genitourinary:       No incontinence  Musculoskeletal: Positive for back pain.  Neurological: Negative for weakness and numbness.  All other systems reviewed and are negative.    Physical Exam Updated Vital Signs BP 116/74 (BP Location: Right Arm)   Pulse 120   Temp 98.3 F (36.8 C) (Oral)   Resp 22   SpO2 92%   Physical Exam  Constitutional: He is oriented to person, place, and time. He appears well-developed and well-nourished.  Laying on left lateral decubitus, moaning in pain  HENT:  Head: Normocephalic and atraumatic.  Right Ear: External ear normal.  Left Ear: External ear normal.  Nose: Nose normal.  Eyes: Right eye exhibits no discharge. Left eye exhibits no discharge.  Neck: Neck supple.  Cardiovascular: Normal rate, regular rhythm and normal heart sounds.   Pulmonary/Chest: Effort normal and breath sounds normal. He has no wheezes.  Abdominal: Soft. He exhibits no distension. There is no tenderness.  Musculoskeletal: He exhibits no edema.       Lumbar back: He exhibits tenderness and bony tenderness.  Neurological: He is alert and oriented to person, place, and time.  5/5 strength in BLE. Normal gross sensation  Skin: Skin is warm and dry.  Nursing note and vitals reviewed.    ED Treatments / Results  Labs (all labs ordered are listed, but only abnormal results are displayed) Labs Reviewed  BASIC METABOLIC PANEL - Abnormal; Notable for the following:       Result Value   Sodium 134 (*)    Chloride 97 (*)    BUN 81 (*)    Creatinine, Ser 13.65 (*)    GFR calc non Af Amer 3 (*)    GFR calc Af Amer 4 (*)    All other components within normal limits  CBC WITH DIFFERENTIAL/PLATELET - Abnormal; Notable for the following:    WBC 14.3 (*)    RBC 1.97 (*)    Hemoglobin 6.7 (*)    HCT 18.9 (*)     RDW 16.9 (*)    Neutro Abs 12.1 (*)    All other components within normal limits  CBC  BASIC METABOLIC PANEL  VITAMIN G18  FOLATE  IRON AND TIBC  FERRITIN  RETICULOCYTES  POC OCCULT BLOOD, ED  PREPARE RBC (CROSSMATCH)  TYPE AND SCREEN  ABO/RH    EKG  EKG Interpretation None       Radiology Dg Chest 2 View  Result Date: 08/03/2016 CLINICAL DATA:  Subacute onset of cough and mid chest pain. Initial encounter. EXAM: CHEST  2 VIEW COMPARISON:  Chest radiograph performed 07/29/2016 FINDINGS: The lungs are well-aerated. Vascular congestion is noted. Increased interstitial markings raise concern for mild pulmonary edema. There is no evidence of pleural effusion or pneumothorax. The heart is mildly enlarged. No acute osseous abnormalities are seen. IMPRESSION: Vascular congestion and mild cardiomegaly. Increased interstitial markings raise concern for mild pulmonary edema. Electronically Signed   By: Garald Balding M.D.   On: 08/03/2016 18:56   Ct Renal Stone Study  Result Date: 08/03/2016 CLINICAL DATA:  Cough for 3 weeks, low back pain. History of HIV, hepatitis, end-stage renal disease on dialysis, hypertension. EXAM: CT ABDOMEN AND PELVIS WITHOUT CONTRAST TECHNIQUE: Multidetector CT imaging of the abdomen and pelvis was performed following the standard protocol without IV contrast. COMPARISON:  None. FINDINGS: LOWER CHEST: Heterogeneous lung attenuation in the bases compatible with small airway disease. Two 7 mm RIGHT lower lobe pulmonary nodules (series 4, image 30/57 and series 4, image 25/57). Adjacent 3 mm pulmonary nodule. 8 mm LEFT lower lobe pulmonary nodule (series 4, image 18/57). Heart size is mildly enlarged. No pericardial effusions. HEPATOBILIARY: Liver is normal by noncontrast CT appearance. Sub cm gallstone at the neck without CT findings of acute cholecystitis. PANCREAS: Normal. SPLEEN: Normal. ADRENALS/URINARY TRACT: Kidneys are orthotopic, and symmetrically atrophic. No  nephrolithiasis, hydronephrosis; limited assessment for renal masses on this nonenhanced examination. The unopacified ureters are normal in course and caliber. Urinary bladder is decompressed and unremarkable. Normal adrenal glands. STOMACH/BOWEL: The  stomach, small and large bowel are normal in course and caliber without inflammatory changes, the sensitivity may be decreased by lack of enteric contrast. Mild amount of retained large bowel stool. Normal appendix. VASCULAR/LYMPHATIC: Aorta is normal course and caliber, severe calcific atherosclerosis. Ectatic Common iliac arteries with severe calcific atherosclerosis. No lymphadenopathy by CT size criteria. REPRODUCTIVE: Normal. OTHER: No intraperitoneal free fluid or free air. MUSCULOSKELETAL: Non-acute. Diffusely sclerotic axial skeleton compatible with renal osteodystrophy. Moderate RIGHT, severe LEFT hip osteoarthrosis. Severe L4-5 and L5-S1 degenerative discs without soft tissue component to suggest infection. Severe LEFT L4-5, severe RIGHT L5-S1 neural foraminal narrowing. No acute osseous process. IMPRESSION: Symmetrically atrophic kidneys without urolithiasis or obstructive uropathy. No acute intra-abdominal or pelvic process. Severe degenerative change of lower lumbar spine resulting in severe L4-5 and L5-S1 neural foraminal narrowing. Multiple lung base pulmonary nodules measure up to 8 mm. Recommend follow-up CT chest at 3-6 months. Heterogeneous lung attenuation compatible with small airway disease. Severe atherosclerosis. Electronically Signed   By: Elon Alas M.D.   On: 08/03/2016 21:08    Procedures Procedures (including critical care time)  CRITICAL CARE Performed by: Sherwood Gambler T   Total critical care time: 30 minutes  Critical care time was exclusive of separately billable procedures and treating other patients.  Critical care was necessary to treat or prevent imminent or life-threatening deterioration.  Critical care was  time spent personally by me on the following activities: development of treatment plan with patient and/or surrogate as well as nursing, discussions with consultants, evaluation of patient's response to treatment, examination of patient, obtaining history from patient or surrogate, ordering and performing treatments and interventions, ordering and review of laboratory studies, ordering and review of radiographic studies, pulse oximetry and re-evaluation of patient's condition.   Medications Ordered in ED Medications  fentaNYL (SUBLIMAZE) injection 50 mcg (50 mcg Nasal Given 08/03/16 1912)  0.9 %  sodium chloride infusion (not administered)  tenofovir (VIREAD) tablet 300 mg (not administered)  traZODone (DESYREL) tablet 50 mg (not administered)  raltegravir (ISENTRESS) tablet 400 mg (not administered)  hydrALAZINE (APRESOLINE) tablet 100 mg (not administered)  lamivudine (EPIVIR-HBV) 5 MG/ML solution 25 mg (not administered)  multivitamin (RENA-VIT) tablet 1 tablet (not administered)  carvedilol (COREG) tablet 25 mg (not administered)  cloNIDine (CATAPRES) tablet 0.1 mg (not administered)  Ipratropium-Albuterol (COMBIVENT) respimat 1 puff (not administered)  albuterol (PROVENTIL) (2.5 MG/3ML) 0.083% nebulizer solution 2.5 mg (not administered)  amLODipine (NORVASC) tablet 5 mg (not administered)  calcium acetate (PHOSLO) capsule 1,334-2,001 mg (not administered)  heparin injection 5,000 Units (not administered)  oxyCODONE-acetaminophen (PERCOCET/ROXICET) 5-325 MG per tablet 1 tablet (not administered)  methocarbamol (ROBAXIN) tablet 500 mg (not administered)  HYDROmorphone (DILAUDID) injection 1 mg (1 mg Intravenous Given 08/03/16 2109)     Initial Impression / Assessment and Plan / ED Course  I have reviewed the triage vital signs and the nursing notes.  Pertinent labs & imaging results that were available during my care of the patient were reviewed by me and considered in my medical  decision making (see chart for details).  Clinical Course as of Aug 04 2335  Mon Aug 03, 2016  2038 Back pain is probably musculoskeletal. Given degree of pain and HTN, will get CT to r/o stone or AAA. IV dilaudid for pain  [SG]  2212 Patient's hgb is 6.7. Was 9 5 days ago. Denies GI bleeding or other bleeding symptoms. Will get check stool, transfuse (patient consents) and admit  [SG]  2232 D/w Dr.  Alcario Drought, he will admit to tele obs at [facility], Dr. Hal Hope will be accepting.  [SG]    Clinical Course User Index [SG] Sherwood Gambler, MD    Patient has significant spinal stenosis seen on his CT scan. He does not have any acute neurologic complaints and my suspicion for spinal cord emergency is low. Has improved after one dose of IV Dilaudid. Unclear etiology for his anemia. He has not had any known rectal bleeding and rectal exam shows light brown stool that is heme negative. Will order blood transfusion and he will be admitted to the hospitalist. Request transfer to [facility] because he will probably need dialysis after his blood transfusion. Does not appear to need emergent dialysis currently.  Final Clinical Impressions(s) / ED Diagnoses   Final diagnoses:  Anemia, unspecified type  ESRD (end stage renal disease) on dialysis ([place])  Acute midline low back pain without sciatica    New Prescriptions New Prescriptions   No medications on file     Sherwood Gambler, MD 08/03/16 2337

## 2016-08-03 NOTE — ED Notes (Signed)
RN will start an IV line and draw blood

## 2016-08-03 NOTE — ED Notes (Signed)
MD collected POC occult blood.

## 2016-08-03 NOTE — ED Triage Notes (Signed)
Per EMS. Pt has had a cough for the past 3 weeks. Pt started noticing lower back pain with coughing when cough first began, but gradually became worse as cough continued. Pain worse with coughing.

## 2016-08-03 NOTE — H&P (Signed)
History and Physical    Jeffrey [name]:[id number] DOB: November 21, 1955 DOA: 08/03/2016   PCP: Rexene Agent, MD Chief Complaint:  Chief Complaint  Patient presents with  . Back Pain    HPI: Jeffrey [name] is a 60 y.o. male with medical history significant of ESRD on home HD, HIV on HAART.  Patient presents to the ED with c/o severe low back pain.  Has been taking oxycodone and advil (yes I told him not to take this ever again until discussing with nephrology due to ulcer risk) without relief of pain at home.  Has had cough for past 2 weeks which has become productive of sputum over past one week.  Given azithromycin here on 11/8.  Cough essentially unchanged.  Low back had been hurting only during coughing spells to now having continuous severe "muscle spasm" quality pain.  ED Course: Pain controlled with dilaudid.  Lab work shows HGB 6.7.  Guiac is negative.  Of note he also had anemia requiring transfusion in July this year.  He is on IV iron infusions with dialysis as well as erythropoetin every 15 days.  No significant blood loss with dialysis they report.  Review of Systems: As per HPI otherwise 10 point review of systems negative.    Past Medical History:  Diagnosis Date  . Anemia   . Anxiety   . Arthritis   . Asthma    per pt hx  . End stage renal disease on home HD 07/10/2011   Started HD in September 2012 at [facility] with a tunneled HD catheter, now on home HD with NxtStage. Dialyzing through AVF L lower arm with buttonhole technique as of mid 2014. His brother does the HD treatments at home.  They are roommates for 23 years.  The brother works 3rd shift and gets off about 8am and then puts [name] on HD in the morning after getting home. Most of the time he does HD about 4 times a week, for about 4 hours per treatment. Cause of ESRD was HTN according to patient. He says he let his health go and ending up with complications, and that he didn't like seeing doctors in those days.  He  says he was diagnosed with severe HTN when he lived in [place] in his 24's.   . Gout   . Hepatitis B carrier ([place])   . HIV infection ([place])   . Hypertension   . Hyperthyroidism   . Pneumonia   . Seizure ([place])   . Seizures ([place]) 06/02/2011   pt denies this  . Thrombocytopenia ([place])     Past Surgical History:  Procedure Laterality Date  . AV FISTULA PLACEMENT  06/02/11   Left radiocephalic AVF  . COLONOSCOPY    . OTHER SURGICAL HISTORY     removal temporary HD catheter   . REVISON OF ARTERIOVENOUS FISTULA Left 10/10/2015   Procedure: REVISON OF LEFT RADIOCEPHALIC ARTERIOVENOUS FISTULA;  Surgeon: Angelia Mould, MD;  Location: [place];  Service: Vascular;  Laterality: Left;  . REVISON OF ARTERIOVENOUS FISTULA Left 02/07/2016   Procedure: REPAIR OF PSEUDO-ANEUREYSM OF LEFT ARM  ARTERIOVENOUS FISTULA;  Surgeon: Angelia Mould, MD;  Location: [place];  Service: Vascular;  Laterality: Left;  . REVISON OF ARTERIOVENOUS FISTULA Left 8/93/8101   Procedure: PLICATION OF LEFT ARM RADIOCEPHALIC ARTERIOVENOUS FISTULA PSEUDOANEURYSM;  Surgeon: Angelia Mould, MD;  Location: [place];  Service: Vascular;  Laterality: Left;     reports that he quit smoking  about a year ago. His smoking use included Cigarettes. He has never used smokeless tobacco. He reports that he does not drink alcohol or use drugs.  Allergies  Allergen Reactions  . Lisinopril Shortness Of Breath and Swelling    Throat swelling  . Penicillins Other (See Comments)    Childhood allergy Has patient had a PCN reaction causing immediate rash, facial/tongue/throat swelling, SOB or lightheadedness with hypotension: Unknown Has patient had a PCN reaction causing severe rash involving mucus membranes or skin necrosis: Unknown Has patient had a PCN reaction that required hospitalization: Unknown Has patient had a PCN reaction occurring within the last 10 years: Unknown If all of the above answers are "NO", then may  proceed with Cephalosporin use.     Family History  Problem Relation Age of Onset  . Hypertension Mother   . Diabetes Mother   . Cancer Father       Prior to Admission medications   Medication Sig Start Date End Date Taking? Authorizing Provider  albuterol (PROVENTIL) (2.5 MG/3ML) 0.083% nebulizer solution Take 2.5 mg by nebulization every 6 (six) hours as needed for wheezing or shortness of breath.   Yes Historical Provider, MD  amLODipine (NORVASC) 10 MG tablet Take 5 mg by mouth 2 (two) times daily.    Yes Historical Provider, MD  calcium acetate (PHOSLO) 667 MG capsule Take 1,334-2,001 mg by mouth 3 (three) times daily with meals. 2 capsules with Lunch and 3 capsules with Dinner 06/06/15  Yes Historical Provider, MD  carvedilol (COREG) 25 MG tablet Take 25 mg by mouth 2 (two) times daily with a meal.   Yes Historical Provider, MD  cloNIDine (CATAPRES) 0.1 MG tablet Take 0.1 mg by mouth as needed (as needed related to hypertension while on hemodialysis).  06/10/15  Yes Historical Provider, MD  COMBIVENT RESPIMAT 20-100 MCG/ACT AERS respimat INHALE 1 PUFF BY MOUTH EVERY 4 HOURS AS NEEDED FOR WHEEZING OR SHORTNESS OF BREATH 06/09/16  Yes Ophelia Shoulder, MD  EPIVIR HBV 5 MG/ML solution TAKE 5 ML BY MOUTH ONCE DAILY 10/01/15  Yes Thayer Headings, MD  hydrALAZINE (APRESOLINE) 100 MG tablet Take 100 mg by mouth 3 (three) times daily.  08/02/16  Yes Historical Provider, MD  ISENTRESS 400 MG tablet TAKE 1 TABLET(400 MG) BY MOUTH TWICE DAILY 08/03/16  Yes Campbell Riches, MD  multivitamin (RENA-VIT) TABS tablet Take 1 tablet by mouth daily.     Yes Historical Provider, MD  traZODone (DESYREL) 50 MG tablet Take 50 mg by mouth at bedtime as needed for sleep.   Yes Historical Provider, MD  ethyl chloride spray Apply 1 application topically daily as needed. At HD 05/20/15   Historical Provider, MD  tenofovir (VIREAD) 300 MG tablet Take 1 tablet (300 mg total) by mouth every Saturday. After dialysis on  Saturday 01/27/16   Campbell Riches, MD    Physical Exam: Vitals:   08/03/16 1752 08/03/16 1934 08/03/16 2208  BP: 182/86 190/84 111/73  Pulse: 105 110 112  Resp: 22 17 21   Temp: 98.3 F (36.8 C)    TempSrc: Oral    SpO2: 97% 92% 91%      Constitutional: NAD, calm, comfortable Eyes: PERRL, lids and conjunctivae normal ENMT: Mucous membranes are moist. Posterior pharynx clear of any exudate or lesions.Normal dentition.  Neck: normal, supple, no masses, no thyromegaly Respiratory: clear to auscultation bilaterally, no wheezing, no crackles. Normal respiratory effort. No accessory muscle use.  Cardiovascular: Regular rate and rhythm, no murmurs /  rubs / gallops. No extremity edema. 2+ pedal pulses. No carotid bruits.  Abdomen: no tenderness, no masses palpated. No hepatosplenomegaly. Bowel sounds positive.  Musculoskeletal: no clubbing / cyanosis. No joint deformity upper and lower extremities. Good ROM, no contractures. Normal muscle tone.  Skin: no rashes, lesions, ulcers. No induration Neurologic: CN 2-12 grossly intact. Sensation intact, DTR normal. Strength 5/5 in all 4.  Psychiatric: Normal judgment and insight. Alert and oriented x 3. Normal mood.    Labs on Admission: I have personally reviewed following labs and imaging studies  CBC:  Recent Labs Lab 07/29/16 0909 08/03/16 2111  WBC 6.0 14.3*  NEUTROABS  --  12.1*  HGB 8.4* 6.7*  HCT 24.8* 18.9*  MCV 91.9 95.9  PLT 126* 440   Basic Metabolic Panel:  Recent Labs Lab 07/29/16 0909 08/03/16 2111  NA 137 134*  K 4.3 4.6  CL 99* 97*  CO2 23 23  GLUCOSE 106* 92  BUN 69* 81*  CREATININE 14.47* 13.65*  CALCIUM 9.5 9.5   GFR: CrCl cannot be calculated (Unknown ideal weight.). Liver Function Tests: No results for input(s): AST, ALT, ALKPHOS, BILITOT, PROT, ALBUMIN in the last 168 hours. No results for input(s): LIPASE, AMYLASE in the last 168 hours. No results for input(s): AMMONIA in the last 168  hours. Coagulation Profile: No results for input(s): INR, PROTIME in the last 168 hours. Cardiac Enzymes: No results for input(s): CKTOTAL, CKMB, CKMBINDEX, TROPONINI in the last 168 hours. BNP (last 3 results) No results for input(s): PROBNP in the last 8760 hours. HbA1C: No results for input(s): HGBA1C in the last 72 hours. CBG: No results for input(s): GLUCAP in the last 168 hours. Lipid Profile: No results for input(s): CHOL, HDL, LDLCALC, TRIG, CHOLHDL, LDLDIRECT in the last 72 hours. Thyroid Function Tests: No results for input(s): TSH, T4TOTAL, FREET4, T3FREE, THYROIDAB in the last 72 hours. Anemia Panel: No results for input(s): VITAMINB12, FOLATE, FERRITIN, TIBC, IRON, RETICCTPCT in the last 72 hours. Urine analysis:    Component Value Date/Time   COLORURINE AMBER (A) 06/14/2011 1513   APPEARANCEUR CLOUDY (A) 06/14/2011 1513   LABSPEC 1.020 06/14/2011 1513   PHURINE 6.0 06/14/2011 1513   GLUCOSEU NEGATIVE 06/14/2011 1513   HGBUR LARGE (A) 06/14/2011 1513   BILIRUBINUR SMALL (A) 06/14/2011 1513   KETONESUR NEGATIVE 06/14/2011 1513   PROTEINUR >300 (A) 06/14/2011 1513   UROBILINOGEN 0.2 06/14/2011 1513   NITRITE NEGATIVE 06/14/2011 1513   LEUKOCYTESUR SMALL (A) 06/14/2011 1513   Sepsis Labs: @LABRCNTIP (procalcitonin:4,lacticidven:4) )No results found for this or any previous visit (from the past 240 hour(s)).   Radiological Exams on Admission: Dg Chest 2 View  Result Date: 08/03/2016 CLINICAL DATA:  Subacute onset of cough and mid chest pain. Initial encounter. EXAM: CHEST  2 VIEW COMPARISON:  Chest radiograph performed 07/29/2016 FINDINGS: The lungs are well-aerated. Vascular congestion is noted. Increased interstitial markings raise concern for mild pulmonary edema. There is no evidence of pleural effusion or pneumothorax. The heart is mildly enlarged. No acute osseous abnormalities are seen. IMPRESSION: Vascular congestion and mild cardiomegaly. Increased  interstitial markings raise concern for mild pulmonary edema. Electronically Signed   By: Garald Balding M.D.   On: 08/03/2016 18:56   Ct Renal Stone Study  Result Date: 08/03/2016 CLINICAL DATA:  Cough for 3 weeks, low back pain. History of HIV, hepatitis, end-stage renal disease on dialysis, hypertension. EXAM: CT ABDOMEN AND PELVIS WITHOUT CONTRAST TECHNIQUE: Multidetector CT imaging of the abdomen and pelvis was performed following  the standard protocol without IV contrast. COMPARISON:  None. FINDINGS: LOWER CHEST: Heterogeneous lung attenuation in the bases compatible with small airway disease. Two 7 mm RIGHT lower lobe pulmonary nodules (series 4, image 30/57 and series 4, image 25/57). Adjacent 3 mm pulmonary nodule. 8 mm LEFT lower lobe pulmonary nodule (series 4, image 18/57). Heart size is mildly enlarged. No pericardial effusions. HEPATOBILIARY: Liver is normal by noncontrast CT appearance. Sub cm gallstone at the neck without CT findings of acute cholecystitis. PANCREAS: Normal. SPLEEN: Normal. ADRENALS/URINARY TRACT: Kidneys are orthotopic, and symmetrically atrophic. No nephrolithiasis, hydronephrosis; limited assessment for renal masses on this nonenhanced examination. The unopacified ureters are normal in course and caliber. Urinary bladder is decompressed and unremarkable. Normal adrenal glands. STOMACH/BOWEL: The stomach, small and large bowel are normal in course and caliber without inflammatory changes, the sensitivity may be decreased by lack of enteric contrast. Mild amount of retained large bowel stool. Normal appendix. VASCULAR/LYMPHATIC: Aorta is normal course and caliber, severe calcific atherosclerosis. Ectatic Common iliac arteries with severe calcific atherosclerosis. No lymphadenopathy by CT size criteria. REPRODUCTIVE: Normal. OTHER: No intraperitoneal free fluid or free air. MUSCULOSKELETAL: Non-acute. Diffusely sclerotic axial skeleton compatible with renal osteodystrophy.  Moderate RIGHT, severe LEFT hip osteoarthrosis. Severe L4-5 and L5-S1 degenerative discs without soft tissue component to suggest infection. Severe LEFT L4-5, severe RIGHT L5-S1 neural foraminal narrowing. No acute osseous process. IMPRESSION: Symmetrically atrophic kidneys without urolithiasis or obstructive uropathy. No acute intra-abdominal or pelvic process. Severe degenerative change of lower lumbar spine resulting in severe L4-5 and L5-S1 neural foraminal narrowing. Multiple lung base pulmonary nodules measure up to 8 mm. Recommend follow-up CT chest at 3-6 months. Heterogeneous lung attenuation compatible with small airway disease. Severe atherosclerosis. Electronically Signed   By: Elon Alas M.D.   On: 08/03/2016 21:08    EKG: Independently reviewed.  Assessment/Plan Principal Problem:   Symptomatic anemia Active Problems:   End stage renal disease on home HD   Essential hypertension   HIV disease (HCC)   Back pain   Pulmonary nodules    1. Symptomatic anemia - 1. Transfuse 1 unit PRBC 2. Anemia pnl pending, cause unclear at this point 2. Back pain - appears to be musculoskeletal at this point 1. Will start patient on robaxin PO 2. Percocet for breakthrough 3. ESRD - left message with dialysis consult line for hospital dialysis tomorrow 4. HTN - continue home meds 5. HIV - continue HAART 6. Pulmonary nodules - needs repeat CT chest in 3-6 months   DVT prophylaxis: Heparin [place] Code Status: Full Family Communication: Family at bedside Consults called: left message with nephrology for inpatient dialysis Admission status: Admit to obs   Etta Quill DO [facility] Pager [phone] from 7PM-7AM  If 7AM-7PM, please contact the day physician for the patient [email or web address] Password [email or web address]  08/03/2016, 10:58 PM

## 2016-08-04 DIAGNOSIS — J209 Acute bronchitis, unspecified: Secondary | ICD-10-CM | POA: Diagnosis present

## 2016-08-04 DIAGNOSIS — M47816 Spondylosis without myelopathy or radiculopathy, lumbar region: Secondary | ICD-10-CM | POA: Diagnosis present

## 2016-08-04 DIAGNOSIS — B20 Human immunodeficiency virus [HIV] disease: Secondary | ICD-10-CM

## 2016-08-04 DIAGNOSIS — M48061 Spinal stenosis, lumbar region without neurogenic claudication: Secondary | ICD-10-CM | POA: Diagnosis present

## 2016-08-04 DIAGNOSIS — M898X9 Other specified disorders of bone, unspecified site: Secondary | ICD-10-CM | POA: Diagnosis present

## 2016-08-04 DIAGNOSIS — N186 End stage renal disease: Secondary | ICD-10-CM

## 2016-08-04 DIAGNOSIS — Z888 Allergy status to other drugs, medicaments and biological substances status: Secondary | ICD-10-CM | POA: Diagnosis not present

## 2016-08-04 DIAGNOSIS — D649 Anemia, unspecified: Secondary | ICD-10-CM | POA: Diagnosis not present

## 2016-08-04 DIAGNOSIS — I10 Essential (primary) hypertension: Secondary | ICD-10-CM | POA: Diagnosis not present

## 2016-08-04 DIAGNOSIS — J189 Pneumonia, unspecified organism: Secondary | ICD-10-CM | POA: Diagnosis not present

## 2016-08-04 DIAGNOSIS — R918 Other nonspecific abnormal finding of lung field: Secondary | ICD-10-CM | POA: Diagnosis present

## 2016-08-04 DIAGNOSIS — I330 Acute and subacute infective endocarditis: Secondary | ICD-10-CM | POA: Diagnosis present

## 2016-08-04 DIAGNOSIS — B957 Other staphylococcus as the cause of diseases classified elsewhere: Secondary | ICD-10-CM | POA: Diagnosis present

## 2016-08-04 DIAGNOSIS — Z880 Allergy status to penicillin: Secondary | ICD-10-CM | POA: Diagnosis not present

## 2016-08-04 DIAGNOSIS — M62838 Other muscle spasm: Secondary | ICD-10-CM | POA: Diagnosis present

## 2016-08-04 DIAGNOSIS — Z992 Dependence on renal dialysis: Secondary | ICD-10-CM | POA: Diagnosis not present

## 2016-08-04 DIAGNOSIS — R7881 Bacteremia: Secondary | ICD-10-CM | POA: Diagnosis not present

## 2016-08-04 DIAGNOSIS — M5126 Other intervertebral disc displacement, lumbar region: Secondary | ICD-10-CM | POA: Diagnosis not present

## 2016-08-04 DIAGNOSIS — J45909 Unspecified asthma, uncomplicated: Secondary | ICD-10-CM | POA: Diagnosis present

## 2016-08-04 DIAGNOSIS — Z79899 Other long term (current) drug therapy: Secondary | ICD-10-CM | POA: Diagnosis not present

## 2016-08-04 DIAGNOSIS — Z8249 Family history of ischemic heart disease and other diseases of the circulatory system: Secondary | ICD-10-CM | POA: Diagnosis not present

## 2016-08-04 DIAGNOSIS — D631 Anemia in chronic kidney disease: Secondary | ICD-10-CM | POA: Diagnosis present

## 2016-08-04 DIAGNOSIS — N2581 Secondary hyperparathyroidism of renal origin: Secondary | ICD-10-CM | POA: Diagnosis present

## 2016-08-04 DIAGNOSIS — M545 Low back pain: Secondary | ICD-10-CM

## 2016-08-04 DIAGNOSIS — M4800 Spinal stenosis, site unspecified: Secondary | ICD-10-CM | POA: Diagnosis present

## 2016-08-04 DIAGNOSIS — I120 Hypertensive chronic kidney disease with stage 5 chronic kidney disease or end stage renal disease: Secondary | ICD-10-CM | POA: Diagnosis present

## 2016-08-04 DIAGNOSIS — B181 Chronic viral hepatitis B without delta-agent: Secondary | ICD-10-CM | POA: Diagnosis present

## 2016-08-04 LAB — RENAL FUNCTION PANEL
ALBUMIN: 3.2 g/dL — AB (ref 3.5–5.0)
ANION GAP: 13 (ref 5–15)
BUN: 83 mg/dL — AB (ref 6–20)
CHLORIDE: 98 mmol/L — AB (ref 101–111)
CO2: 23 mmol/L (ref 22–32)
Calcium: 9.4 mg/dL (ref 8.9–10.3)
Creatinine, Ser: 15.56 mg/dL — ABNORMAL HIGH (ref 0.61–1.24)
GFR, EST AFRICAN AMERICAN: 3 mL/min — AB (ref >60)
GFR, EST NON AFRICAN AMERICAN: 3 mL/min — AB (ref >60)
Glucose, Bld: 93 mg/dL (ref 65–99)
PHOSPHORUS: 4.5 mg/dL (ref 2.5–4.6)
POTASSIUM: 4.7 mmol/L (ref 3.5–5.1)
Sodium: 134 mmol/L — ABNORMAL LOW (ref 135–145)

## 2016-08-04 LAB — PREPARE RBC (CROSSMATCH)

## 2016-08-04 LAB — CBC
HEMATOCRIT: 16.8 % — AB (ref 39.0–52.0)
Hemoglobin: 5.5 g/dL — CL (ref 13.0–17.0)
MCH: 28.8 pg (ref 26.0–34.0)
MCHC: 32.7 g/dL (ref 30.0–36.0)
MCV: 88 fL (ref 78.0–100.0)
Platelets: 142 10*3/uL — ABNORMAL LOW (ref 150–400)
RBC: 1.91 MIL/uL — AB (ref 4.22–5.81)
RDW: 15.3 % (ref 11.5–15.5)
WBC: 15 10*3/uL — AB (ref 4.0–10.5)

## 2016-08-04 LAB — TYPE AND SCREEN
ABO/RH(D): O NEG
ANTIBODY SCREEN: NEGATIVE
UNIT DIVISION: 0

## 2016-08-04 MED ORDER — METHOCARBAMOL 500 MG PO TABS
750.0000 mg | ORAL_TABLET | Freq: Four times a day (QID) | ORAL | Status: DC
  Administered 2016-08-04 – 2016-08-08 (×15): 750 mg via ORAL
  Filled 2016-08-04 (×15): qty 2

## 2016-08-04 MED ORDER — METHOCARBAMOL 500 MG PO TABS
500.0000 mg | ORAL_TABLET | Freq: Once | ORAL | Status: AC
  Administered 2016-08-04: 500 mg via ORAL
  Filled 2016-08-04: qty 1

## 2016-08-04 MED ORDER — LEVOFLOXACIN IN D5W 500 MG/100ML IV SOLN
500.0000 mg | INTRAVENOUS | Status: DC
  Administered 2016-08-05: 500 mg via INTRAVENOUS
  Filled 2016-08-04 (×2): qty 100

## 2016-08-04 MED ORDER — SODIUM CHLORIDE 0.9 % IV SOLN
125.0000 mg | INTRAVENOUS | Status: DC
  Filled 2016-08-04 (×2): qty 10

## 2016-08-04 MED ORDER — DIAZEPAM 2 MG PO TABS: 2.0000 mg | ORAL_TABLET | Freq: Once | ORAL | Status: DC

## 2016-08-04 MED ORDER — SODIUM CHLORIDE 0.9 % IV SOLN: Freq: Once | INTRAVENOUS | Status: DC

## 2016-08-04 MED ORDER — OXYCODONE-ACETAMINOPHEN 5-325 MG PO TABS
ORAL_TABLET | ORAL | Status: AC
  Administered 2016-08-04: 1 via ORAL
  Filled 2016-08-04: qty 1

## 2016-08-04 MED ORDER — LEVOFLOXACIN IN D5W 750 MG/150ML IV SOLN
750.0000 mg | Freq: Once | INTRAVENOUS | Status: AC
  Administered 2016-08-04: 750 mg via INTRAVENOUS
  Filled 2016-08-04: qty 150

## 2016-08-04 MED ORDER — PENTAFLUOROPROP-TETRAFLUOROETH EX AERO
INHALATION_SPRAY | CUTANEOUS | Status: AC
  Filled 2016-08-04: qty 103.5

## 2016-08-04 MED ORDER — HYDROMORPHONE HCL 1 MG/ML IJ SOLN
1.0000 mg | INTRAMUSCULAR | Status: DC | PRN
  Administered 2016-08-04 – 2016-08-05 (×4): 1 mg via INTRAVENOUS
  Filled 2016-08-04 (×4): qty 1

## 2016-08-04 MED ORDER — DIAZEPAM 5 MG PO TABS
2.5000 mg | ORAL_TABLET | Freq: Once | ORAL | Status: AC
  Administered 2016-08-04: 2.5 mg via ORAL
  Filled 2016-08-04: qty 1

## 2016-08-04 MED ORDER — RENA-VITE PO TABS
1.0000 | ORAL_TABLET | Freq: Every day | ORAL | Status: DC
  Administered 2016-08-04 – 2016-08-07 (×3): 1 via ORAL
  Filled 2016-08-04 (×4): qty 1

## 2016-08-04 MED ORDER — DARBEPOETIN ALFA 200 MCG/0.4ML IJ SOSY
200.0000 ug | PREFILLED_SYRINGE | INTRAMUSCULAR | Status: DC
  Filled 2016-08-04: qty 0.4

## 2016-08-04 MED ORDER — PREDNISONE 50 MG PO TABS
60.0000 mg | ORAL_TABLET | Freq: Every day | ORAL | Status: DC
  Administered 2016-08-05 – 2016-08-08 (×4): 60 mg via ORAL
  Filled 2016-08-04 (×5): qty 1

## 2016-08-04 MED ORDER — SODIUM CHLORIDE 0.9 % IV SOLN
Freq: Once | INTRAVENOUS | Status: AC
  Administered 2016-08-04: 14:00:00 via INTRAVENOUS

## 2016-08-04 NOTE — Progress Notes (Signed)
PROGRESS NOTE  Jeffrey [name]  FGH:[id number] DOB: 27-Nov-1955 DOA: 08/03/2016 PCP: Rexene Agent, MD Outpatient Specialists:  Subjective: Patient was in mild distress because of severe back pain. He was also warm, per nursing staff had low temperature of 100 earlier today.  Brief Narrative:  Jeffrey [name] is a 60 y.o. male with medical history significant of ESRD on home HD, HIV on HAART.  Patient presents to the ED with c/o severe low back pain.  Has been taking oxycodone and advil (yes I told him not to take this ever again until discussing with nephrology due to ulcer risk) without relief of pain at home.  Has had cough for past 2 weeks which has become productive of sputum over past one week.  Given azithromycin here on 11/8.  Cough essentially unchanged.  Low back had been hurting only during coughing spells to now having continuous severe "muscle spasm" quality pain.  Assessment & Plan:   Principal Problem:   Symptomatic anemia Active Problems:   End stage renal disease on home HD   Essential hypertension   HIV disease (HCC)   Back pain   Pulmonary nodules   CAP (community acquired pneumonia)    Symptomatic anemia -Has chronic anemia secondary to ESRD came in with hemoglobin of 6.7. -Anemia panel pending, patient gets IV iron and Aranesp with dialysis. -Seen by nephrology, transfuse 2 units of packed RBCs with hemodialysis.  Fever -Developed fever 101.2, with WBC 14.3, unclear source of the fever. -Some cough, CXR and pneumonia. ? Acute bronchitis -Started on levofloxacin after blood cultures obtained, continue Levaquin.  Back pain -Severe lower back pain, CT urogram was done showed no evidence of nephrolithiasis. -CT showed extensive arthritic changes involving the L4-5 and L5-S1 with neural foramen stenosis. -Treated with opioid pain medications, muscle relaxants and steroid taper.  ESRD -Nephrology consulted for routine dialysis.  HIV -CD4 count is 360 in  September 2017, continue ARVs.   HTN -Continue home medications.  Pulmonary nodules -This is an incidental finding, per radiology CT chest in 3-6 months.   DVT prophylaxis: SQ heparin Code Status: Full Code Family Communication:  Disposition Plan:  Diet: Diet renal with fluid restriction Fluid restriction: 1200 mL Fluid; Room service appropriate? Yes; Fluid consistency: Thin  Consultants:   Nephrology  Procedures:   Hemodialysis  Antimicrobials:   Levofloxacin   Objective: Vitals:   08/03/16 2328 08/04/16 0018 08/04/16 0147 08/04/16 0523  BP: 116/74 115/98 (!) 190/89 (!) 177/86  Pulse: 120 116 (!) 103 (!) 111  Resp: 22 22 20 18   Temp:  101.2 F (38.4 C) 100 F (37.8 C) 100 F (37.8 C)  TempSrc:  Oral Oral Oral  SpO2: 92% 98% 100% 96%    Intake/Output Summary (Last 24 hours) at 08/04/16 1103 Last data filed at 08/04/16 0910  Gross per 24 hour  Intake              240 ml  Output                0 ml  Net              240 ml   There were no vitals filed for this visit.  Examination: General exam: Appears calm and comfortable  Respiratory system: Clear to auscultation. Respiratory effort normal. Cardiovascular system: S1 & S2 heard, RRR. No JVD, murmurs, rubs, gallops or clicks. No pedal edema. Gastrointestinal system: Abdomen is nondistended, soft and nontender. No organomegaly or masses felt. Normal bowel  sounds heard. Central nervous system: Alert and oriented. No focal neurological deficits. Extremities: Symmetric 5 x 5 power. Skin: No rashes, lesions or ulcers Psychiatry: Judgement and insight appear normal. Mood & affect appropriate.   Data Reviewed: I have personally reviewed following labs and imaging studies  CBC:  Recent Labs Lab 07/29/16 0909 08/03/16 2111  WBC 6.0 14.3*  NEUTROABS  --  12.1*  HGB 8.4* 6.7*  HCT 24.8* 18.9*  MCV 91.9 95.9  PLT 126* 643   Basic Metabolic Panel:  Recent Labs Lab 07/29/16 0909 08/03/16 2111  NA 137  134*  K 4.3 4.6  CL 99* 97*  CO2 23 23  GLUCOSE 106* 92  BUN 69* 81*  CREATININE 14.47* 13.65*  CALCIUM 9.5 9.5   GFR: CrCl cannot be calculated (Unknown ideal weight.). Liver Function Tests: No results for input(s): AST, ALT, ALKPHOS, BILITOT, PROT, ALBUMIN in the last 168 hours. No results for input(s): LIPASE, AMYLASE in the last 168 hours. No results for input(s): AMMONIA in the last 168 hours. Coagulation Profile: No results for input(s): INR, PROTIME in the last 168 hours. Cardiac Enzymes: No results for input(s): CKTOTAL, CKMB, CKMBINDEX, TROPONINI in the last 168 hours. BNP (last 3 results) No results for input(s): PROBNP in the last 8760 hours. HbA1C: No results for input(s): HGBA1C in the last 72 hours. CBG: No results for input(s): GLUCAP in the last 168 hours. Lipid Profile: No results for input(s): CHOL, HDL, LDLCALC, TRIG, CHOLHDL, LDLDIRECT in the last 72 hours. Thyroid Function Tests: No results for input(s): TSH, T4TOTAL, FREET4, T3FREE, THYROIDAB in the last 72 hours. Anemia Panel: No results for input(s): VITAMINB12, FOLATE, FERRITIN, TIBC, IRON, RETICCTPCT in the last 72 hours. Urine analysis:    Component Value Date/Time   COLORURINE AMBER (A) 06/14/2011 1513   APPEARANCEUR CLOUDY (A) 06/14/2011 1513   LABSPEC 1.020 06/14/2011 1513   PHURINE 6.0 06/14/2011 1513   GLUCOSEU NEGATIVE 06/14/2011 1513   HGBUR LARGE (A) 06/14/2011 1513   BILIRUBINUR SMALL (A) 06/14/2011 1513   KETONESUR NEGATIVE 06/14/2011 1513   PROTEINUR >300 (A) 06/14/2011 1513   UROBILINOGEN 0.2 06/14/2011 1513   NITRITE NEGATIVE 06/14/2011 1513   LEUKOCYTESUR SMALL (A) 06/14/2011 1513   Sepsis Labs: @LABRCNTIP (procalcitonin:4,lacticidven:4)  )No results found for this or any previous visit (from the past 240 hour(s)).   Invalid input(s): PROCALCITONIN, LACTICACIDVEN   Radiology Studies: Dg Chest 2 View  Result Date: 08/03/2016 CLINICAL DATA:  Subacute onset of cough and  mid chest pain. Initial encounter. EXAM: CHEST  2 VIEW COMPARISON:  Chest radiograph performed 07/29/2016 FINDINGS: The lungs are well-aerated. Vascular congestion is noted. Increased interstitial markings raise concern for mild pulmonary edema. There is no evidence of pleural effusion or pneumothorax. The heart is mildly enlarged. No acute osseous abnormalities are seen. IMPRESSION: Vascular congestion and mild cardiomegaly. Increased interstitial markings raise concern for mild pulmonary edema. Electronically Signed   By: Garald Balding M.D.   On: 08/03/2016 18:56   Ct Renal Stone Study  Result Date: 08/03/2016 CLINICAL DATA:  Cough for 3 weeks, low back pain. History of HIV, hepatitis, end-stage renal disease on dialysis, hypertension. EXAM: CT ABDOMEN AND PELVIS WITHOUT CONTRAST TECHNIQUE: Multidetector CT imaging of the abdomen and pelvis was performed following the standard protocol without IV contrast. COMPARISON:  None. FINDINGS: LOWER CHEST: Heterogeneous lung attenuation in the bases compatible with small airway disease. Two 7 mm RIGHT lower lobe pulmonary nodules (series 4, image 30/57 and series 4, image 25/57). Adjacent 3  mm pulmonary nodule. 8 mm LEFT lower lobe pulmonary nodule (series 4, image 18/57). Heart size is mildly enlarged. No pericardial effusions. HEPATOBILIARY: Liver is normal by noncontrast CT appearance. Sub cm gallstone at the neck without CT findings of acute cholecystitis. PANCREAS: Normal. SPLEEN: Normal. ADRENALS/URINARY TRACT: Kidneys are orthotopic, and symmetrically atrophic. No nephrolithiasis, hydronephrosis; limited assessment for renal masses on this nonenhanced examination. The unopacified ureters are normal in course and caliber. Urinary bladder is decompressed and unremarkable. Normal adrenal glands. STOMACH/BOWEL: The stomach, small and large bowel are normal in course and caliber without inflammatory changes, the sensitivity may be decreased by lack of enteric  contrast. Mild amount of retained large bowel stool. Normal appendix. VASCULAR/LYMPHATIC: Aorta is normal course and caliber, severe calcific atherosclerosis. Ectatic Common iliac arteries with severe calcific atherosclerosis. No lymphadenopathy by CT size criteria. REPRODUCTIVE: Normal. OTHER: No intraperitoneal free fluid or free air. MUSCULOSKELETAL: Non-acute. Diffusely sclerotic axial skeleton compatible with renal osteodystrophy. Moderate RIGHT, severe LEFT hip osteoarthrosis. Severe L4-5 and L5-S1 degenerative discs without soft tissue component to suggest infection. Severe LEFT L4-5, severe RIGHT L5-S1 neural foraminal narrowing. No acute osseous process. IMPRESSION: Symmetrically atrophic kidneys without urolithiasis or obstructive uropathy. No acute intra-abdominal or pelvic process. Severe degenerative change of lower lumbar spine resulting in severe L4-5 and L5-S1 neural foraminal narrowing. Multiple lung base pulmonary nodules measure up to 8 mm. Recommend follow-up CT chest at 3-6 months. Heterogeneous lung attenuation compatible with small airway disease. Severe atherosclerosis. Electronically Signed   By: Elon Alas M.D.   On: 08/03/2016 21:08        Scheduled Meds: . sodium chloride   Intravenous Once  . sodium chloride   Intravenous Once  . amLODipine  5 mg Oral BID  . calcium acetate  1,334 mg Oral Q lunch  . carvedilol  25 mg Oral BID WC  . darbepoetin (ARANESP) injection - DIALYSIS  200 mcg Intravenous Q Tue-HD  . ferric gluconate (FERRLECIT/NULECIT) IV  125 mg Intravenous Q Tue-HD  . heparin  5,000 Units Subcutaneous Q8H  . hydrALAZINE  100 mg Oral TID  . lamiVUDine  25 mg Oral Daily  . [START ON 08/05/2016] levofloxacin (LEVAQUIN) IV  500 mg Intravenous Q48H  . methocarbamol  750 mg Oral QID  . multivitamin  1 tablet Oral QHS  . raltegravir  400 mg Oral BID  . [START ON 08/08/2016] tenofovir  300 mg Oral Q Sat   Continuous Infusions:   LOS: 0 days    Time  spent: 35 minutes    Adriauna Campton A, MD [facility] Pager [phone]  If 7PM-7AM, please contact night-coverage [email or web address] Password [email or web address] 08/04/2016, 11:03 AM

## 2016-08-04 NOTE — Consult Note (Signed)
[facility] Renal Consultation Note  Indication for Consultation:  Management of ESRD/hemodialysis; anemia, hypertension/volume and secondary hyperparathyroidism  HPI: Jeffrey [name] is a 60 y.o. male with  ESRD sec HTN  On Home Hemodialysis (Tue, Thurs , Sat, Sun ),  Hepatitis B antigen Positive ,HIV on ARVs [well controlled -- Jeffrey [name]], Anemia -- severe /symptomis admit 7/19-7/21/2017  Also req transfusion; referred to GI advised CSY -- pt has not f/u to do so yet. Admitted again with weakness /symptomatic anemia HGB 6.5 (recent ER hgb 8.4 07/29/16 for Upper resp. Infection visit ) op [facility] labs= hgb 8.5 07/28/16 with Transfer. Sat 29%) he had missed his OP Mircera  dosing  Last week at home training Center  (279mg q 2weeks ) and has a history of sporadic HD at home. Per his brother  who preforms  his Hemo at home "Last tx  Post HD  Below edw (86kg) at 844kg/ denies melena   and no excess bleeding from his LFA AVF. He does have a scheduled [facility]ul[name]  Friday 08/05/16  To eval Aneurysmal formations .  Reports progressive weakness since past Friday . Also co of lower Back pain  With CT  showing =Severe degenerative change of lower lumbar spine resulting in severe L4-5 and L5-S1 neural foraminal narrowing.. Now cos mostly of lower back discomfort . Denies fevers, chills, sob, chest pain ,cough or abdominal pain .     Past Medical History:  Diagnosis Date  . Anemia   . Anxiety   . Arthritis   . Asthma    per pt hx  . End stage renal disease on home HD 07/10/2011   [facility], now on home HD with [facility]. Dialyzing through AVF L lower arm with buttonhole technique as of mid 2014. His brother does the HD treatments at home.  They are roommates for 23 years.  The brother works 3rd shift and gets off about 8am and then puts Jeffrey [name] HD in the morning after getting home. Most of the time  he does HD about 4 times a week, for about 4 hours per treatment. Cause of ESRD was HTN according to patient. He says he let his health go and ending up with complications, and that he didn't like seeing doctors in those days.  [place]   . Gout   . Hepatitis B carrier ([place]   . HIV infection ([place]   . Hypertension   . Hyperthyroidism   . Pneumonia   . Seizure ([place]   . Seizures ([place] 06/02/2011   pt denies this  . Thrombocytopenia ([place]     Past Surgical History:  Procedure Laterality Date  . AV FISTULA PLACEMENT  06/02/11   Left radiocephalic AVF  . COLONOSCOPY    . OTHER SURGICAL HISTORY     removal temporary HD catheter   . REVISON OF ARTERIOVENOUS FISTULA Left 10/10/2015   Procedure: REVISON OF LEFT RADIOCEPHALIC ARTERIOVENOUS FISTULA;  Surgeon: CAngelia Mould MD;  [facility]  Service: Vascular;  Laterality: Left;  . REVISON OF ARTERIOVENOUS FISTULA Left 02/07/2016   Procedure: REPAIR OF PSEUDO-ANEUREYSM OF LEFT ARM  ARTERIOVENOUS FISTULA;  Surgeon: CAngelia Mould MD;  [facility]  Service: Vascular;  Laterality: Left;  . REVISON OF ARTERIOVENOUS FISTULA Left 04/10/2016  Procedure: PLICATION OF LEFT ARM RADIOCEPHALIC ARTERIOVENOUS FISTULA PSEUDOANEURYSM;  Surgeon: Angelia Mould, MD;  Location[facility];  Service: Vascular;  Laterality: Left;      Family History  Problem Relation Age of Onset  . Hypertension Mother   . Diabetes Mother   . Cancer Father       reports that he quit smoking about a year ago. His smoking use included Cigarettes. He has never used smokeless tobacco. He reports that he does not drink alcohol or use drugs.   Allergies  Allergen Reactions  . Lisinopril Shortness Of Breath and Swelling    Throat swelling  . Penicillins Other (See Comments)    Childhood allergy Has patient had a PCN reaction causing immediate rash, facial/tongue/throat swelling,  SOB or lightheadedness with hypotension: Unknown Has patient had a PCN reaction causing severe rash involving mucus membranes or skin necrosis: Unknown Has patient had a PCN reaction that required hospitalization: Unknown Has patient had a PCN reaction occurring within the last 10 years: Unknown If all of the above answers are "NO", then may proceed with Cephalosporin use.     Prior to Admission medications   Medication Sig Start Date End Date Taking? Authorizing Provider  albuterol (PROVENTIL) (2.5 MG/3ML) 0.083% nebulizer solution Take 2.5 mg by nebulization every 6 (six) hours as needed for wheezing or shortness of breath.   Yes Historical Provider, MD  amLODipine (NORVASC) 10 MG tablet Take 5 mg by mouth 2 (two) times daily.    Yes Historical Provider, MD  calcium acetate (PHOSLO) 667 MG capsule Take 1,334-2,001 mg by mouth 3 (three) times daily with meals. 2 capsules with Lunch and 3 capsules with Dinner 06/06/15  Yes Historical Provider, MD  carvedilol (COREG) 25 MG tablet Take 25 mg by mouth 2 (two) times daily with a meal.   Yes Historical Provider, MD  cloNIDine (CATAPRES) 0.1 MG tablet Take 0.1 mg by mouth as needed (as needed related to hypertension while on hemodialysis).  06/10/15  Yes Historical Provider, MD  COMBIVENT RESPIMAT 20-100 MCG/ACT AERS respimat INHALE 1 PUFF BY MOUTH EVERY 4 HOURS AS NEEDED FOR WHEEZING OR SHORTNESS OF BREATH 06/09/16  Yes Ophelia Shoulder, MD  EPIVIR HBV 5 MG/ML solution TAKE 5 ML BY MOUTH ONCE DAILY 10/01/15  Yes Thayer Headings, MD  hydrALAZINE (APRESOLINE) 100 MG tablet Take 100 mg by mouth 3 (three) times daily.  08/02/16  Yes Historical Provider, MD  ISENTRESS 400 MG tablet TAKE 1 TABLET(400 MG) BY MOUTH TWICE DAILY 08/03/16  Yes Campbell Riches, MD  multivitamin (RENA-VIT) TABS tablet Take 1 tablet by mouth daily.     Yes Historical Provider, MD  traZODone (DESYREL) 50 MG tablet Take 50 mg by mouth at bedtime as needed for sleep.   Yes Historical  Provider, MD  ethyl chloride spray Apply 1 application topically daily as needed. At HD 05/20/15   Historical Provider, MD  tenofovir (VIREAD) 300 MG tablet Take 1 tablet (300 mg total) by mouth every Saturday. After dialysis on Saturday 01/27/16   Campbell Riches, MD    BHA:[id number], ipratropium-albuterol, oxyCODONE-acetaminophen, traZODone  Results for orders placed or performed during the hospital encounter of 08/03/16 (from the past 48 hour(s))  Basic metabolic panel     Status: Abnormal   Collection Time: 08/03/16  9:11 PM  Result Value Ref Range   Sodium 134 (L) 135 - 145 mmol/L   Potassium 4.6 3.5 - 5.1 mmol/L   Chloride 97 (L) 101 - 111  mmol/L   CO2 23 22 - 32 mmol/L   Glucose, Bld 92 65 - 99 mg/dL   BUN 81 (H) 6 - 20 mg/dL   Creatinine, Ser 13.65 (H) 0.61 - 1.24 mg/dL   Calcium 9.5 8.9 - 10.3 mg/dL   GFR calc non Af Amer 3 (L) >60 mL/min   GFR calc Af Amer 4 (L) >60 mL/min    Comment: (NOTE) The eGFR has been calculated using the CKD EPI equation. This calculation has not been validated in all clinical situations. eGFR's persistently <60 mL/min signify possible Chronic Kidney Disease.    Anion gap 14 5 - 15  CBC with Differential     Status: Abnormal   Collection Time: 08/03/16  9:11 PM  Result Value Ref Range   WBC 14.3 (H) 4.0 - 10.5 K/uL   RBC 1.97 (L) 4.22 - 5.81 MIL/uL   Hemoglobin 6.7 (LL) 13.0 - 17.0 g/dL    Comment: REPEATED TO VERIFY CRITICAL RESULT CALLED TO, READ BACK BY AND VERIFIED WITH: Germain Osgood RN 11.13.17 '@2140'  ZANDO,C    HCT 18.9 (L) 39.0 - 52.0 %   MCV 95.9 78.0 - 100.0 fL   MCH 34.0 26.0 - 34.0 pg   MCHC 35.4 30.0 - 36.0 g/dL   RDW 16.9 (H) 11.5 - 15.5 %   Platelets 162 150 - 400 K/uL   Neutrophils Relative % 85 %   Lymphocytes Relative 7 %   Monocytes Relative 6 %   Eosinophils Relative 2 %   Basophils Relative 0 %   Neutro Abs 12.1 (H) 1.7 - 7.7 K/uL   Lymphs Abs 1.0 0.7 - 4.0 K/uL   Monocytes Absolute 0.9 0.1 - 1.0 K/uL   Eosinophils  Absolute 0.3 0.0 - 0.7 K/uL   Basophils Absolute 0.0 0.0 - 0.1 K/uL   Smear Review MORPHOLOGY UNREMARKABLE   POC occult blood, ED RN will collect     Status: None   Collection Time: 08/03/16 10:27 PM  Result Value Ref Range   Fecal Occult Bld NEGATIVE NEGATIVE  Prepare RBC     Status: None   Collection Time: 08/03/16 10:35 PM  Result Value Ref Range   Order Confirmation ORDER PROCESSED BY BLOOD BANK   Type and screen     Status: None   Collection Time: 08/03/16 10:35 PM  Result Value Ref Range   ABO/RH(D) O NEG    Antibody Screen NEG    Sample Expiration 08/06/2016    Unit Number [id number]    Blood Component Type RED CELLS,LR    Unit division 00    Status of Unit DISCARDED    Transfusion Status OK TO TRANSFUSE    Crossmatch Result Compatible   ABO/Rh     Status: None   Collection Time: 08/03/16 10:35 PM  Result Value Ref Range   ABO/RH(D) O NEG   Type and screen [place]     Status: None   Collection Time: 08/04/16  5:41 AM  Result Value Ref Range   ABO/RH(D) O NEG    Antibody Screen NEG    Sample Expiration 08/07/2016     ROS: see hpi for positives    Physical Exam: Vitals:   08/04/16 0147 08/04/16 0523  BP: (!) 190/89 (!) 177/86  Pulse: (!) 103 (!) 111  Resp: 20 18  Temp: 100 F (37.8 C) 100 F (37.8 C)     General: alert AAM  OX3,  ,WD, WN , Anxious slightly cos Lower back Discomfort  HEENT: [place] , MMM, EOMI  Neck: supple no jvd Heart:  Regular Tachy, 100 on Monitor , No mur, or rub  Lungs: Scattered coarse sounds Abdomen: bs pos, soft, NT, ND Extremities:  No pedal edema  Skin: no pedal edema or overt rash Neuro: alert, OX4 moves all extrem  Dialysis Access: Pos bruit L FA AVF  With 2 small aneurysmal areas  Dialysis Orders: Center: HOME HD (Next stage)  on Tue, Thurs, Sat, Sun, HD  ("usually depends on his working schedule") . EDW  86 kg  HD   Heparin 6000. Access  L FA AVF BFR 400     Mircera 225 mcg q 2weeks    Units IV/HD   Venofer  100 mg q weekly    Assessment/Plan 1. Anemia Of ESRD- Missing his doses of Mircera at [place] and Uremia suspect- transfuse 2 units PRBCS on HD today/ Give aranesp 200 q Tuesday HD here 2. ESRD -  HOME HD as OP  Will do Tue, Thurs ,Sat  4hrs in hosp 3. Hypertension/volume  -  bp up ?? Volume (wt loss below edw last hd ) uf 2 l on hd , ?? Med adherance at home  bp meds  Fu bp trend with HD UF 4. Metabolic bone disease -  On phoslo as binder with Phos controlled as op and PTH also no vit d (per Dr. Joelyn Oms last notes) 5. Lower Back pain (severe DJD on CT ) - per Admit team  6. Hep B antigen Pos. - HD in Isolation  7. HIV- meds per admit  8. Pulmonary nodules - needs repeat CT chest in 3-6 months    Ernest Haber, PA-C [place] [phone] 08/04/2016, 8:55 AM    Pt seen, examined and agree w A/P as above.  Kelly Splinter MD [employer] pager [phone]   08/04/2016, 1:20 PM

## 2016-08-04 NOTE — Significant Event (Addendum)
Will treat patient as Jeffrey [name].  Also has new O2 requirement on 2L via [place]. Will convert status to inpatient. Treating with levaquin (h/o PCN allergy, unknown if cephalosporin allergy). Tylenol PRN fever. Cultures ordered. CD4 count reviewed, was 360 in Sept with undetectable HIV viral load.  AIDS defining illnesses thus not the primary focus of treatment nor highly suspected at this time. Holding transfusion for now until fever treated.

## 2016-08-04 NOTE — ED Notes (Signed)
Spoke with Dr. Hurley Cisco about temp. Blood sent back to blood bank. Blood cultures being drawn.

## 2016-08-04 NOTE — Progress Notes (Signed)
CRITICAL VALUE ALERT  Critical value received:  Hgb 5.5  Date of notification:  08/04/16  Time of notification:  1421  Critical value read back: yes   Nurse who received alert:  Wallace Cullens RN   MD notified (1st page):  MD at desk already getting 2 units of blood transfused   Time of first page:    MD notified (2nd page):  Time of second page:  Responding MD:   Time MD responded:

## 2016-08-04 NOTE — Procedures (Signed)
  I was present at this dialysis session, have reviewed the session itself and made  appropriate changes Kelly Splinter MD [place] pager [phone]   08/04/2016, 2:37 PM

## 2016-08-04 NOTE — Progress Notes (Signed)
Flow manager called to see which service pt should be placed. Awaiting response.

## 2016-08-04 NOTE — Progress Notes (Signed)
[facility] called that Pt has arrived to floor.

## 2016-08-04 NOTE — Progress Notes (Signed)
Pt has to be type and screened again per lab. Blood consent signed.

## 2016-08-04 NOTE — Progress Notes (Signed)
Pharmacy Antibiotic Note  Jeffrey [name] is a 60 y.o. male admitted on 08/03/2016 with pneumonia.  Pharmacy has been consulted for Levoflxacin dosing.  Patient is ESRD on HD TTSat. Currently in HD. Has already received dose of Levofloxacin this morning around 0230- 750mg  IV x1.  Plan: -Continue Levofloxacin 500mg  IV q48h- next dose due 11/15 at 2200 -Follow HD schedule/tolerance, clinical progression, length of therapy  Weight: 184 lb 15.5 oz (83.9 kg)  Temp (24hrs), Avg:99.7 F (37.6 C), Min:98.3 F (36.8 C), Max:101.2 F (38.4 C)   Recent Labs Lab 07/29/16 0909 08/03/16 2111  WBC 6.0 14.3*  CREATININE 14.47* 13.65*    Estimated Creatinine Clearance: 6.2 mL/min (by C-G formula based on SCr of 13.65 mg/dL (H)).    Allergies  Allergen Reactions  . Lisinopril Shortness Of Breath and Swelling    Throat swelling  . Penicillins Other (See Comments)    Childhood allergy Has patient had a PCN reaction causing immediate rash, facial/tongue/throat swelling, SOB or lightheadedness with hypotension: Unknown Has patient had a PCN reaction causing severe rash involving mucus membranes or skin necrosis: Unknown Has patient had a PCN reaction that required hospitalization: Unknown Has patient had a PCN reaction occurring within the last 10 years: Unknown If all of the above answers are "NO", then may proceed with Cephalosporin use.     Antimicrobials this admission: Levofloxacin 11/14  >>   Dose adjustments this admission: n/a  Microbiology results: 11/14 BCx: sent 11/14 Sputum: sent    Thank you for allowing pharmacy to be a part of this patient's care.  Mikale Silversmith D. Elius Etheredge, PharmD, BCPS Clinical Pharmacist Pager: [phone] 08/04/2016 2:09 PM

## 2016-08-05 ENCOUNTER — Inpatient Hospital Stay (HOSPITAL_COMMUNITY): Payer: Medicare Other

## 2016-08-05 DIAGNOSIS — J209 Acute bronchitis, unspecified: Secondary | ICD-10-CM

## 2016-08-05 DIAGNOSIS — R7881 Bacteremia: Principal | ICD-10-CM

## 2016-08-05 LAB — BLOOD CULTURE ID PANEL (REFLEXED)
ACINETOBACTER BAUMANNII: NOT DETECTED
CANDIDA GLABRATA: NOT DETECTED
CANDIDA KRUSEI: NOT DETECTED
Candida albicans: NOT DETECTED
Candida parapsilosis: NOT DETECTED
Candida tropicalis: NOT DETECTED
ENTEROBACTER CLOACAE COMPLEX: NOT DETECTED
ENTEROCOCCUS SPECIES: NOT DETECTED
ESCHERICHIA COLI: NOT DETECTED
Enterobacteriaceae species: NOT DETECTED
Haemophilus influenzae: NOT DETECTED
Klebsiella oxytoca: NOT DETECTED
Klebsiella pneumoniae: NOT DETECTED
LISTERIA MONOCYTOGENES: NOT DETECTED
Methicillin resistance: NOT DETECTED
NEISSERIA MENINGITIDIS: NOT DETECTED
PROTEUS SPECIES: NOT DETECTED
PSEUDOMONAS AERUGINOSA: NOT DETECTED
STAPHYLOCOCCUS AUREUS BCID: NOT DETECTED
STAPHYLOCOCCUS SPECIES: DETECTED — AB
STREPTOCOCCUS AGALACTIAE: NOT DETECTED
STREPTOCOCCUS PNEUMONIAE: NOT DETECTED
STREPTOCOCCUS SPECIES: NOT DETECTED
Serratia marcescens: NOT DETECTED
Streptococcus pyogenes: NOT DETECTED

## 2016-08-05 LAB — CBC
HCT: 20.9 % — ABNORMAL LOW (ref 39.0–52.0)
Hemoglobin: 7.5 g/dL — ABNORMAL LOW (ref 13.0–17.0)
MCH: 33.8 pg (ref 26.0–34.0)
MCHC: 35.9 g/dL (ref 30.0–36.0)
MCV: 94.1 fL (ref 78.0–100.0)
PLATELETS: 132 10*3/uL — AB (ref 150–400)
RBC: 2.22 MIL/uL — AB (ref 4.22–5.81)
RDW: 16.3 % — AB (ref 11.5–15.5)
WBC: 10.3 10*3/uL (ref 4.0–10.5)

## 2016-08-05 LAB — GLUCOSE, CAPILLARY: GLUCOSE-CAPILLARY: 109 mg/dL — AB (ref 65–99)

## 2016-08-05 MED ORDER — HYDROMORPHONE HCL 1 MG/ML IJ SOLN
1.0000 mg | Freq: Once | INTRAMUSCULAR | Status: AC
  Administered 2016-08-05: 1 mg via INTRAVENOUS

## 2016-08-05 MED ORDER — DARBEPOETIN ALFA 200 MCG/0.4ML IJ SOSY
200.0000 ug | PREFILLED_SYRINGE | INTRAMUSCULAR | Status: DC
  Administered 2016-08-06: 200 ug via INTRAVENOUS
  Filled 2016-08-05: qty 0.4

## 2016-08-05 MED ORDER — VANCOMYCIN HCL IN DEXTROSE 1-5 GM/200ML-% IV SOLN
1000.0000 mg | INTRAVENOUS | Status: DC
  Administered 2016-08-06: 1000 mg via INTRAVENOUS
  Filled 2016-08-05: qty 200

## 2016-08-05 MED ORDER — SODIUM CHLORIDE 0.9 % IV SOLN
1750.0000 mg | Freq: Once | INTRAVENOUS | Status: AC
  Administered 2016-08-05: 1750 mg via INTRAVENOUS
  Filled 2016-08-05: qty 1750

## 2016-08-05 MED ORDER — HYDROMORPHONE HCL 1 MG/ML IJ SOLN
1.0000 mg | INTRAMUSCULAR | Status: DC | PRN
  Administered 2016-08-05 – 2016-08-08 (×11): 1 mg via INTRAVENOUS
  Filled 2016-08-05 (×11): qty 1

## 2016-08-05 NOTE — Progress Notes (Signed)
[facility] PROGRESS NOTE  Jeffrey [name] UDJ:[id number] DOB: 14-Mar-1956 DOA: 08/03/2016  PCP: Rexene Agent, MD  Brief History/Interval Summary: 60 year old African-American male with a past medical history of end-stage renal disease on home hemodialysis, HIV on antiretroviral treatment, presented to the emergency department with complaints of severe low back pain. Patient also had symptoms of cough and shortness of breath. Patient was diagnosed as having bronchitis. He was hospitalized for further management.  Reason for Visit: Bacteremia  Consultants: Nephrology  Procedures: None yet  Antibiotics: Vancomycin and Levaquin  Subjective/Interval History: Patient continues to have severe low back pain. Denies radiation of the pain to legs. Denies any problems urinating. Able to move  his legs. Cough persists and he continues to have yellowish sputum. He denies any nausea or vomiting  ROS: Denies any chest pain or shortness of breath  Objective:  Vital Signs  Vitals:   08/04/16 1748 08/04/16 2108 08/05/16 0510 08/05/16 1000  BP: 113/82 (!) 169/60 114/86 116/85  Pulse: 88 97 92 90  Resp:   20 20  Temp: 98.4 F (36.9 C) 99.7 F (37.6 C) 98.5 F (36.9 C) 98.4 F (36.9 C)  TempSrc: Oral Oral Oral Oral  SpO2: 96% 93% 90% 93%  Weight:  82.7 kg (182 lb 5.1 oz)      Intake/Output Summary (Last 24 hours) at 08/05/16 1321 Last data filed at 08/05/16 0900  Gross per 24 hour  Intake              910 ml  Output             2378 ml  Net            -1468 ml   Filed Weights   08/04/16 1245 08/04/16 1701 08/04/16 2108  Weight: 83.9 kg (184 lb 15.5 oz) 82.9 kg (182 lb 12.2 oz) 82.7 kg (182 lb 5.1 oz)    General appearance: alert, cooperative, appears stated age and no distress Resp: Coarse breath sounds bilaterally. Few scattered wheezes. No rhonchi. No definite crackles. Cardio: regular rate and rhythm, S1, S2 normal, no murmur, click, rub or gallop GI: soft, non-tender;  bowel sounds normal; no masses,  no organomegaly Extremities: extremities normal, atraumatic, no cyanosis or edema Back: No obvious deformity noted. No rashes. No swelling. Neurologic: Awake and alert. Oriented 3. No focal neurological deficits noted.  Lab Results:  Data Reviewed: I have personally reviewed following labs and imaging studies  CBC:  Recent Labs Lab 08/03/16 2111 08/04/16 1425 08/05/16 0901  WBC 14.3* 15.0* 10.3  NEUTROABS 12.1*  --   --   HGB 6.7* 5.5* 7.5*  HCT 18.9* 16.8* 20.9*  MCV 95.9 88.0 94.1  PLT 162 142* 132*    Basic Metabolic Panel:  Recent Labs Lab 08/03/16 2111 08/04/16 1425  NA 134* 134*  K 4.6 4.7  CL 97* 98*  CO2 23 23  GLUCOSE 92 93  BUN 81* 83*  CREATININE 13.65* 15.56*  CALCIUM 9.5 9.4  PHOS  --  4.5    GFR: Estimated Creatinine Clearance: 5.4 mL/min (by C-G formula based on SCr of 15.56 mg/dL (H)).  Liver Function Tests:  Recent Labs Lab 08/04/16 1425  ALBUMIN 3.2*    CBG:  Recent Labs Lab 08/05/16 1136  GLUCAP 109*     Recent Results (from the past 240 hour(s))  Culture, blood (routine x 2)     Status: None (Preliminary result)   Collection Time: 08/04/16 12:25 AM  Result Value Ref  Range Status   Specimen Description BLOOD RIGHT ANTECUBITAL  Final   Special Requests BOTTLES DRAWN AEROBIC AND ANAEROBIC 5ML  Final   Culture  Setup Time   Final    GRAM POSITIVE COCCI IN CLUSTERS IN BOTH AEROBIC AND ANAEROBIC BOTTLES CRITICAL VALUE NOTED.  VALUE IS CONSISTENT WITH PREVIOUSLY REPORTED AND CALLED VALUE.    Culture   Final    TOO YOUNG TO READ Performed at [facility]    Report Status PENDING  Incomplete  Culture, blood (routine x 2)     Status: None (Preliminary result)   Collection Time: 08/04/16 12:36 AM  Result Value Ref Range Status   Specimen Description BLOOD RIGHT FOREARM  Final   Special Requests BOTTLES DRAWN AEROBIC AND ANAEROBIC 5ML  Final   Culture  Setup Time   Final    GRAM  POSITIVE COCCI IN CLUSTERS IN BOTH AEROBIC AND ANAEROBIC BOTTLES CRITICAL RESULT CALLED TO, READ BACK BY AND VERIFIED WITHArnold Long RN AT 0722 08/05/16 BY D. VANHOOK    Culture   Final    GRAM POSITIVE COCCI CULTURE REINCUBATED FOR BETTER GROWTH Performed at [facility]    Report Status PENDING  Incomplete  Blood Culture ID Panel (Reflexed)     Status: Abnormal   Collection Time: 08/04/16 12:36 AM  Result Value Ref Range Status   Enterococcus species NOT DETECTED NOT DETECTED Final   Listeria monocytogenes NOT DETECTED NOT DETECTED Final   Staphylococcus species DETECTED (A) NOT DETECTED Final    Comment: CRITICAL RESULT CALLED TO, READ BACK BY AND VERIFIED WITH: IRichardson Dopp RN AT 6387 08/05/16 BY D. VANHOOK    Staphylococcus aureus NOT DETECTED NOT DETECTED Final   Methicillin resistance NOT DETECTED NOT DETECTED Final   Streptococcus species NOT DETECTED NOT DETECTED Final   Streptococcus agalactiae NOT DETECTED NOT DETECTED Final   Streptococcus pneumoniae NOT DETECTED NOT DETECTED Final   Streptococcus pyogenes NOT DETECTED NOT DETECTED Final   Acinetobacter baumannii NOT DETECTED NOT DETECTED Final   Enterobacteriaceae species NOT DETECTED NOT DETECTED Final   Enterobacter cloacae complex NOT DETECTED NOT DETECTED Final   Escherichia coli NOT DETECTED NOT DETECTED Final   Klebsiella oxytoca NOT DETECTED NOT DETECTED Final   Klebsiella pneumoniae NOT DETECTED NOT DETECTED Final   Proteus species NOT DETECTED NOT DETECTED Final   Serratia marcescens NOT DETECTED NOT DETECTED Final   Haemophilus influenzae NOT DETECTED NOT DETECTED Final   Neisseria meningitidis NOT DETECTED NOT DETECTED Final   Pseudomonas aeruginosa NOT DETECTED NOT DETECTED Final   Candida albicans NOT DETECTED NOT DETECTED Final   Candida glabrata NOT DETECTED NOT DETECTED Final   Candida krusei NOT DETECTED NOT DETECTED Final   Candida parapsilosis NOT DETECTED NOT DETECTED Final   Candida  tropicalis NOT DETECTED NOT DETECTED Final    Comment: Performed at [facility]      Radiology Studies: Dg Chest 2 View  Result Date: 08/03/2016 CLINICAL DATA:  Subacute onset of cough and mid chest pain. Initial encounter. EXAM: CHEST  2 VIEW COMPARISON:  Chest radiograph performed 07/29/2016 FINDINGS: The lungs are well-aerated. Vascular congestion is noted. Increased interstitial markings raise concern for mild pulmonary edema. There is no evidence of pleural effusion or pneumothorax. The heart is mildly enlarged. No acute osseous abnormalities are seen. IMPRESSION: Vascular congestion and mild cardiomegaly. Increased interstitial markings raise concern for mild pulmonary edema. Electronically Signed   By: Garald Balding M.D.   On: 08/03/2016 18:56  Ct Renal Stone Study  Result Date: 08/03/2016 CLINICAL DATA:  Cough for 3 weeks, low back pain. History of HIV, hepatitis, end-stage renal disease on dialysis, hypertension. EXAM: CT ABDOMEN AND PELVIS WITHOUT CONTRAST TECHNIQUE: Multidetector CT imaging of the abdomen and pelvis was performed following the standard protocol without IV contrast. COMPARISON:  None. FINDINGS: LOWER CHEST: Heterogeneous lung attenuation in the bases compatible with small airway disease. Two 7 mm RIGHT lower lobe pulmonary nodules (series 4, image 30/57 and series 4, image 25/57). Adjacent 3 mm pulmonary nodule. 8 mm LEFT lower lobe pulmonary nodule (series 4, image 18/57). Heart size is mildly enlarged. No pericardial effusions. HEPATOBILIARY: Liver is normal by noncontrast CT appearance. Sub cm gallstone at the neck without CT findings of acute cholecystitis. PANCREAS: Normal. SPLEEN: Normal. ADRENALS/URINARY TRACT: Kidneys are orthotopic, and symmetrically atrophic. No nephrolithiasis, hydronephrosis; limited assessment for renal masses on this nonenhanced examination. The unopacified ureters are normal in course and caliber. Urinary bladder is decompressed and  unremarkable. Normal adrenal glands. STOMACH/BOWEL: The stomach, small and large bowel are normal in course and caliber without inflammatory changes, the sensitivity may be decreased by lack of enteric contrast. Mild amount of retained large bowel stool. Normal appendix. VASCULAR/LYMPHATIC: Aorta is normal course and caliber, severe calcific atherosclerosis. Ectatic Common iliac arteries with severe calcific atherosclerosis. No lymphadenopathy by CT size criteria. REPRODUCTIVE: Normal. OTHER: No intraperitoneal free fluid or free air. MUSCULOSKELETAL: Non-acute. Diffusely sclerotic axial skeleton compatible with renal osteodystrophy. Moderate RIGHT, severe LEFT hip osteoarthrosis. Severe L4-5 and L5-S1 degenerative discs without soft tissue component to suggest infection. Severe LEFT L4-5, severe RIGHT L5-S1 neural foraminal narrowing. No acute osseous process. IMPRESSION: Symmetrically atrophic kidneys without urolithiasis or obstructive uropathy. No acute intra-abdominal or pelvic process. Severe degenerative change of lower lumbar spine resulting in severe L4-5 and L5-S1 neural foraminal narrowing. Multiple lung base pulmonary nodules measure up to 8 mm. Recommend follow-up CT chest at 3-6 months. Heterogeneous lung attenuation compatible with small airway disease. Severe atherosclerosis. Electronically Signed   By: Elon Alas M.D.   On: 08/03/2016 21:08     Medications:  Scheduled: . sodium chloride   Intravenous Once  . sodium chloride   Intravenous Once  . sodium chloride   Intravenous Once  . amLODipine  5 mg Oral BID  . calcium acetate  1,334 mg Oral Q lunch  . carvedilol  25 mg Oral BID WC  . [START ON 08/06/2016] darbepoetin (ARANESP) injection - DIALYSIS  200 mcg Intravenous Q Thu-HD  . heparin  5,000 Units Subcutaneous Q8H  . hydrALAZINE  100 mg Oral TID  . lamiVUDine  25 mg Oral Daily  . levofloxacin (LEVAQUIN) IV  500 mg Intravenous Q48H  . methocarbamol  750 mg Oral QID  .  multivitamin  1 tablet Oral QHS  . predniSONE  60 mg Oral Q breakfast  . raltegravir  400 mg Oral BID  . [START ON 08/08/2016] tenofovir  300 mg Oral Q Sat  . [START ON 08/06/2016] vancomycin  1,000 mg Intravenous Q T,Th,Sa-HD   Continuous:  GYI:RSWNIOEVO, HYDROmorphone (DILAUDID) injection, ipratropium-albuterol, oxyCODONE-acetaminophen, traZODone  Assessment/Plan:  Principal Problem:   Symptomatic anemia Active Problems:   End stage renal disease on home HD   Essential hypertension   HIV disease (HCC)   Back pain   Pulmonary nodules   CAP (community acquired pneumonia)    Bacteremia with staph aureus Patient has AV graft through which he dialyzes himself at home. Could be the source of his  bacteremia. Continue vancomycin for now till final identification is available. Consider transthoracic echocardiogram. He will need repeat blood cultures. May have to discuss with ID as well.  Low back pain. Patient tells me that this has been severe over the last 3 days. Otherwise, he does not usually get back pain. CT scan that was done of his abdomen and pelvis suggests severe degenerative disease in the spine. Patient does not have any neurological deficits. However, due to bacteremia concern is for infection. Proceed with MRI of the lumbar spine. Continue pain medications.  Symptomatic anemia Has chronic anemia secondary to ESRD. Came in with hemoglobin of 6.7. Patient was transfused 2 units of blood. Hemoglobin has responded appropriately. No evidence of overt bleeding  Fever Patient also had respiratory symptoms, so there was concern for bronchitis as well. Now he's also bacteremic. Continue with Levaquin along with   ESRD Nephrology is following. Continue to monitor closely.  HIV CD4 count is 360 in September 2017. Continue ARVs.   Essential hypertension  Continue home medications.  Multiple Pulmonary nodules This is an incidental finding seen on CT scan. Per radiology,  repeat CT chest in 3-6 months.   DVT Prophylaxis: Subcutaneous heparin    Code Status: Full code  Family Communication: Discussed with the patient  Disposition Plan: Management as outlined above. Await MRI. Continue IV antibiotics.    LOS: 1 day   [facility] Pager [phone] 08/05/2016, 1:21 PM  If 7PM-7AM, please contact night-coverage at [email or web address], password [facility]

## 2016-08-05 NOTE — Progress Notes (Signed)
[facility] Progress Note   Subjective: no new c/o, mid to lower back still hurting.  Has +blood cx 2/2 for STaph, species pending  Vitals:   08/04/16 1748 08/04/16 2108 08/05/16 0510 08/05/16 1000  BP: 113/82 (!) 169/60 114/86 116/85  Pulse: 88 97 92 90  Resp:   20 20  Temp: 98.4 F (36.9 C) 99.7 F (37.6 C) 98.5 F (36.9 C) 98.4 F (36.9 C)  TempSrc: Oral Oral Oral Oral  SpO2: 96% 93% 90% 93%  Weight:  82.7 kg (182 lb 5.1 oz)      Inpatient medications: . sodium chloride   Intravenous Once  . sodium chloride   Intravenous Once  . sodium chloride   Intravenous Once  . amLODipine  5 mg Oral BID  . calcium acetate  1,334 mg Oral Q lunch  . carvedilol  25 mg Oral BID WC  . [START ON 08/06/2016] darbepoetin (ARANESP) injection - DIALYSIS  200 mcg Intravenous Q Thu-HD  . heparin  5,000 Units Subcutaneous Q8H  . hydrALAZINE  100 mg Oral TID  . lamiVUDine  25 mg Oral Daily  . levofloxacin (LEVAQUIN) IV  500 mg Intravenous Q48H  . methocarbamol  750 mg Oral QID  . multivitamin  1 tablet Oral QHS  . predniSONE  60 mg Oral Q breakfast  . raltegravir  400 mg Oral BID  . [START ON 08/08/2016] tenofovir  300 mg Oral Q Sat  . [START ON 08/06/2016] vancomycin  1,000 mg Intravenous Q T,Th,Sa-HD    cloNIDine, HYDROmorphone (DILAUDID) injection, ipratropium-albuterol, oxyCODONE-acetaminophen, traZODone  Exam: Alert, no distress, calm, WDWN, having back spasms No jvd Chest clear bilat RRR no mrg Abd soft ntnd no ascites Back tender mid-thoracic down to LS spine NF, ox 3 LFA AVF buttonholes very small and no drainage / erythema/ fluctuance   Dialysis: Home HD ([facility])  Usually does HD on T/Th/Sat/Sun sched 86kg  Hep 6000   LFA AVF 15ga buttonhole  - Mircera 225 q 2wks  - Venofer 100 mg per wk      Assessment: 1. Staph bacteremia - await speciation, is now on Vanc/ zosyn IV 2. Back pain - for MRI to eval for poss discitis/ osteo 3. Anemia, symptomatic -  on max ESA here. R/O GIB/ ABL as well, Hb low at 6.7 on admit 4. HIV - counts are good 5. HTN - cont meds 6. Fevers - prob d/t #1 7. Volume is 3 kg under dry  Plan - HD tomorrow, f/u blood cx's, ABx, lower BP meds and dry wt   Kelly Splinter MD [facility] pager [phone]   08/05/2016, 1:54 PM    Recent Labs Lab 08/03/16 2111 08/04/16 1425  NA 134* 134*  K 4.6 4.7  CL 97* 98*  CO2 23 23  GLUCOSE 92 93  BUN 81* 83*  CREATININE 13.65* 15.56*  CALCIUM 9.5 9.4  PHOS  --  4.5    Recent Labs Lab 08/04/16 1425  ALBUMIN 3.2*    Recent Labs Lab 08/03/16 2111 08/04/16 1425 08/05/16 0901  WBC 14.3* 15.0* 10.3  NEUTROABS 12.1*  --   --   HGB 6.7* 5.5* 7.5*  HCT 18.9* 16.8* 20.9*  MCV 95.9 88.0 94.1  PLT 162 142* 132*   Iron/TIBC/Ferritin/ %Sat    Component Value Date/Time   IRON 192 (H) 01/10/2013 0706   TIBC NOT CALC 01/10/2013 0706   FERRITIN 316 01/10/2013 0706   IRONPCTSAT NOT CALC 01/10/2013 0706

## 2016-08-05 NOTE — Progress Notes (Signed)
Pharmacy Antibiotic Note  Jeffrey [name] is a 60 y.o. male with ESRD admitted on 08/03/2016 with cough and back pain. Pharmacy was originally consulted for Levaquin for r/o PNA. The patient is now noted to have 2/2 BCx positive for Staph species - with BCID showing likely MSSE. This was discussed with the MD who opted to add Vancomycin pending further evaluation of the patient later today. ESRD on home HD-T/Th/Sat/Sun - received HD on 11/14.   Plan: 1. Start Vancomycin 1750 mg x 1 dose to load 2. Vancomycin 1g post HD- T/Th/Sat/Sun 3. Continue Levaquin 500 mg IV every 48 hours 4. Will continue to follow HD schedule/duration, culture results, LOT, and antibiotic de-escalation plans   Weight: 182 lb 5.1 oz (82.7 kg)  Temp (24hrs), Avg:98.7 F (37.1 C), Min:98.3 F (36.8 C), Max:99.7 F (37.6 C)   Recent Labs Lab 07/29/16 0909 08/03/16 2111 08/04/16 1425  WBC 6.0 14.3* 15.0*  CREATININE 14.47* 13.65* 15.56*    Estimated Creatinine Clearance: 5.4 mL/min (by C-G formula based on SCr of 15.56 mg/dL (H)).    Allergies  Allergen Reactions  . Lisinopril Shortness Of Breath and Swelling    Throat swelling  . Penicillins Other (See Comments)    Childhood allergy Has patient had a PCN reaction causing immediate rash, facial/tongue/throat swelling, SOB or lightheadedness with hypotension: Unknown Has patient had a PCN reaction causing severe rash involving mucus membranes or skin necrosis: Unknown Has patient had a PCN reaction that required hospitalization: Unknown Has patient had a PCN reaction occurring within the last 10 years: Unknown If all of the above answers are "NO", then may proceed with Cephalosporin use.     Antimicrobials this admission: LVQ 11/14 >> Vanc 11/15 >>  Dose adjustments this admission: n/a  Microbiology results: 2/2 BCx >> GPC in clusters with BCID showing likely MSSE  Thank you for allowing pharmacy to be a part of this patient's care.  Alycia Rossetti, PharmD, BCPS Clinical Pharmacist Pager: [phone] Clinical phone for 08/05/2016 from 7a-3:30p: [phone] If after 3:30p, please call main pharmacy at: [phone] 08/05/2016 8:59 AM

## 2016-08-06 LAB — RENAL FUNCTION PANEL
Albumin: 3.1 g/dL — ABNORMAL LOW (ref 3.5–5.0)
Anion gap: 12 (ref 5–15)
BUN: 57 mg/dL — ABNORMAL HIGH (ref 6–20)
CHLORIDE: 95 mmol/L — AB (ref 101–111)
CO2: 26 mmol/L (ref 22–32)
Calcium: 9.1 mg/dL (ref 8.9–10.3)
Creatinine, Ser: 10.37 mg/dL — ABNORMAL HIGH (ref 0.61–1.24)
GFR, EST AFRICAN AMERICAN: 5 mL/min — AB (ref >60)
GFR, EST NON AFRICAN AMERICAN: 5 mL/min — AB (ref >60)
Glucose, Bld: 109 mg/dL — ABNORMAL HIGH (ref 65–99)
POTASSIUM: 3.7 mmol/L (ref 3.5–5.1)
Phosphorus: 5 mg/dL — ABNORMAL HIGH (ref 2.5–4.6)
Sodium: 133 mmol/L — ABNORMAL LOW (ref 135–145)

## 2016-08-06 LAB — MAGNESIUM: Magnesium: 1.9 mg/dL (ref 1.7–2.4)

## 2016-08-06 LAB — CBC
HEMATOCRIT: 19.8 % — AB (ref 39.0–52.0)
Hemoglobin: 7 g/dL — ABNORMAL LOW (ref 13.0–17.0)
MCH: 32.7 pg (ref 26.0–34.0)
MCHC: 35.4 g/dL (ref 30.0–36.0)
MCV: 92.5 fL (ref 78.0–100.0)
Platelets: 152 10*3/uL (ref 150–400)
RBC: 2.14 MIL/uL — AB (ref 4.22–5.81)
RDW: 15.4 % (ref 11.5–15.5)
WBC: 11.2 10*3/uL — AB (ref 4.0–10.5)

## 2016-08-06 LAB — PREPARE RBC (CROSSMATCH)

## 2016-08-06 LAB — MRSA PCR SCREENING: MRSA by PCR: NEGATIVE

## 2016-08-06 MED ORDER — SODIUM CHLORIDE 0.9 % IV SOLN: 100.0000 mL | INTRAVENOUS | Status: DC | PRN

## 2016-08-06 MED ORDER — HEPARIN SODIUM (PORCINE) 1000 UNIT/ML DIALYSIS: 1000.0000 [IU] | INTRAMUSCULAR | Status: DC | PRN

## 2016-08-06 MED ORDER — PENTAFLUOROPROP-TETRAFLUOROETH EX AERO: 1.0000 "application " | INHALATION_SPRAY | CUTANEOUS | Status: DC | PRN

## 2016-08-06 MED ORDER — SODIUM CHLORIDE 0.9 % IV SOLN
Freq: Once | INTRAVENOUS | Status: DC
Start: 2016-08-06 — End: 2016-08-08

## 2016-08-06 MED ORDER — HEPARIN SODIUM (PORCINE) 1000 UNIT/ML DIALYSIS
6000.0000 [IU] | INTRAMUSCULAR | Status: DC | PRN
  Filled 2016-08-06: qty 6

## 2016-08-06 MED ORDER — ALTEPLASE 2 MG IJ SOLR
2.0000 mg | Freq: Once | INTRAMUSCULAR | Status: DC | PRN
Start: 2016-08-06 — End: 2016-08-06

## 2016-08-06 MED ORDER — LIDOCAINE HCL (PF) 1 % IJ SOLN: 5.0000 mL | INTRAMUSCULAR | Status: DC | PRN

## 2016-08-06 MED ORDER — HYDROMORPHONE HCL 1 MG/ML IJ SOLN
INTRAMUSCULAR | Status: AC
  Filled 2016-08-06: qty 1

## 2016-08-06 MED ORDER — SENNA 8.6 MG PO TABS
1.0000 | ORAL_TABLET | Freq: Every day | ORAL | Status: DC
  Administered 2016-08-06 – 2016-08-08 (×3): 8.6 mg via ORAL
  Filled 2016-08-06 (×3): qty 1

## 2016-08-06 MED ORDER — DARBEPOETIN ALFA 200 MCG/0.4ML IJ SOSY
PREFILLED_SYRINGE | INTRAMUSCULAR | Status: AC
  Filled 2016-08-06: qty 0.4

## 2016-08-06 MED ORDER — POLYETHYLENE GLYCOL 3350 17 G PO PACK
17.0000 g | PACK | Freq: Every day | ORAL | Status: DC
  Administered 2016-08-06 – 2016-08-07 (×2): 17 g via ORAL
  Filled 2016-08-06 (×3): qty 1

## 2016-08-06 MED ORDER — OXYCODONE-ACETAMINOPHEN 5-325 MG PO TABS
1.0000 | ORAL_TABLET | Freq: Three times a day (TID) | ORAL | Status: DC
  Administered 2016-08-06 – 2016-08-08 (×6): 1 via ORAL
  Filled 2016-08-06 (×6): qty 1

## 2016-08-06 MED ORDER — LIDOCAINE-PRILOCAINE 2.5-2.5 % EX CREA: 1.0000 "application " | TOPICAL_CREAM | CUTANEOUS | Status: DC | PRN

## 2016-08-06 MED ORDER — VANCOMYCIN HCL IN DEXTROSE 1-5 GM/200ML-% IV SOLN
INTRAVENOUS | Status: AC
  Filled 2016-08-06: qty 200

## 2016-08-06 NOTE — Progress Notes (Addendum)
[facility] PROGRESS NOTE  Jeffrey [name] EXN:[id number] DOB: [birth year] DOA: 08/03/2016  PCP: Rexene Agent, MD  Brief History/Interval Summary: 60 year old African-American male with a past medical history of end-stage renal disease on home hemodialysis, HIV on antiretroviral treatment, presented to the emergency department with complaints of severe low back pain. Patient also had symptoms of cough and shortness of breath. Patient was diagnosed as having bronchitis. He was hospitalized for further management.  Reason for Visit: Bacteremia  Consultants: Nephrology  Procedures: None yet  Antibiotics: Vancomycin and Levaquin  Subjective/Interval History: Patient states that his back pain persists but slightly better this morning. Continues to have a lot of pain with movement. Cough persists and he continues to have yellowish sputum. He denies any nausea or vomiting.  ROS: Denies any chest pain or shortness of breath  Objective:  Vital Signs  Vitals:   08/06/16 1041 08/06/16 1043 08/06/16 1100 08/06/16 1111  BP: (!) 145/72 (!) 155/74 (!) 143/64 (!) 174/92  Pulse: 77 76 75 80  Resp: 11 11 15  (!) 21  Temp: 98.1 F (36.7 C) 98.3 F (36.8 C) 98.7 F (37.1 C) 98.2 F (36.8 C)  TempSrc: Oral Oral Oral Oral  SpO2:    95%  Weight:    84.5 kg (186 lb 4.6 oz)  Height:        Intake/Output Summary (Last 24 hours) at 08/06/16 1311 Last data filed at 08/06/16 1111  Gross per 24 hour  Intake              890 ml  Output             1047 ml  Net             -157 ml   Filed Weights   08/06/16 0620 08/06/16 0700 08/06/16 1111  Weight: 80.2 kg (176 lb 12.9 oz) 85.7 kg (188 lb 15 oz) 84.5 kg (186 lb 4.6 oz)    General appearance: alert, cooperative, appears stated age and no distress Resp: Improved air entry bilaterally. No wheezing heard today. No rhonchi. No definite crackles. Cardio: regular rate and rhythm, S1, S2 normal, no murmur, click, rub or gallop GI: soft,  non-tender; bowel sounds normal; no masses,  no organomegaly Extremities: extremities normal, atraumatic, no cyanosis or edema Back: No obvious deformity noted. No rashes. No swelling. Neurologic: Awake and alert. Oriented 3. Some limitation noted with lifting of the left leg  Lab Results:  Data Reviewed: I have personally reviewed following labs and imaging studies  CBC:  Recent Labs Lab 08/03/16 2111 08/04/16 1425 08/05/16 0901 08/06/16 0726  WBC 14.3* 15.0* 10.3 11.2*  NEUTROABS 12.1*  --   --   --   HGB 6.7* 5.5* 7.5* 7.0*  HCT 18.9* 16.8* 20.9* 19.8*  MCV 95.9 88.0 94.1 92.5  PLT 162 142* 132* 496    Basic Metabolic Panel:  Recent Labs Lab 08/03/16 2111 08/04/16 1425 08/06/16 0723 08/06/16 0726  NA 134* 134*  --  133*  K 4.6 4.7  --  3.7  CL 97* 98*  --  95*  CO2 23 23  --  26  GLUCOSE 92 93  --  109*  BUN 81* 83*  --  57*  CREATININE 13.65* 15.56*  --  10.37*  CALCIUM 9.5 9.4  --  9.1  MG  --   --  1.9  --   PHOS  --  4.5  --  5.0*    GFR: Estimated Creatinine Clearance:  8.1 mL/min (by C-G formula based on SCr of 10.37 mg/dL (H)).  Liver Function Tests:  Recent Labs Lab 08/04/16 1425 08/06/16 0726  ALBUMIN 3.2* 3.1*    CBG:  Recent Labs Lab 08/05/16 1136  GLUCAP 109*     Recent Results (from the past 240 hour(s))  Culture, blood (routine x 2)     Status: Abnormal (Preliminary result)   Collection Time: 08/04/16 12:25 AM  Result Value Ref Range Status   Specimen Description BLOOD RIGHT ANTECUBITAL  Final   Special Requests BOTTLES DRAWN AEROBIC AND ANAEROBIC 5ML  Final   Culture  Setup Time   Final    GRAM POSITIVE COCCI IN CLUSTERS IN BOTH AEROBIC AND ANAEROBIC BOTTLES CRITICAL VALUE NOTED.  VALUE IS CONSISTENT WITH PREVIOUSLY REPORTED AND CALLED VALUE. Performed at [facility] (COAGULASE NEGATIVE) (A)  Final   Report Status PENDING  Incomplete  Culture, blood (routine x 2)     Status:  Abnormal (Preliminary result)   Collection Time: 08/04/16 12:36 AM  Result Value Ref Range Status   Specimen Description BLOOD RIGHT FOREARM  Final   Special Requests BOTTLES DRAWN AEROBIC AND ANAEROBIC 5ML  Final   Culture  Setup Time   Final    GRAM POSITIVE COCCI IN CLUSTERS IN BOTH AEROBIC AND ANAEROBIC BOTTLES CRITICAL RESULT CALLED TO, READ BACK BY AND VERIFIED WITHArnold Long RN AT 0722 08/05/16 BY Rush Landmark Performed at [facility] (COAGULASE NEGATIVE) (A)  Final   Report Status PENDING  Incomplete  Blood Culture ID Panel (Reflexed)     Status: Abnormal   Collection Time: 08/04/16 12:36 AM  Result Value Ref Range Status   Enterococcus species NOT DETECTED NOT DETECTED Final   Listeria monocytogenes NOT DETECTED NOT DETECTED Final   Staphylococcus species DETECTED (A) NOT DETECTED Final    Comment: CRITICAL RESULT CALLED TO, READ BACK BY AND VERIFIED WITH: IRichardson Dopp RN AT 1610 08/05/16 BY D. VANHOOK    Staphylococcus aureus NOT DETECTED NOT DETECTED Final   Methicillin resistance NOT DETECTED NOT DETECTED Final   Streptococcus species NOT DETECTED NOT DETECTED Final   Streptococcus agalactiae NOT DETECTED NOT DETECTED Final   Streptococcus pneumoniae NOT DETECTED NOT DETECTED Final   Streptococcus pyogenes NOT DETECTED NOT DETECTED Final   Acinetobacter baumannii NOT DETECTED NOT DETECTED Final   Enterobacteriaceae species NOT DETECTED NOT DETECTED Final   Enterobacter cloacae complex NOT DETECTED NOT DETECTED Final   Escherichia coli NOT DETECTED NOT DETECTED Final   Klebsiella oxytoca NOT DETECTED NOT DETECTED Final   Klebsiella pneumoniae NOT DETECTED NOT DETECTED Final   Proteus species NOT DETECTED NOT DETECTED Final   Serratia marcescens NOT DETECTED NOT DETECTED Final   Haemophilus influenzae NOT DETECTED NOT DETECTED Final   Neisseria meningitidis NOT DETECTED NOT DETECTED Final   Pseudomonas aeruginosa NOT DETECTED NOT  DETECTED Final   Candida albicans NOT DETECTED NOT DETECTED Final   Candida glabrata NOT DETECTED NOT DETECTED Final   Candida krusei NOT DETECTED NOT DETECTED Final   Candida parapsilosis NOT DETECTED NOT DETECTED Final   Candida tropicalis NOT DETECTED NOT DETECTED Final    Comment: Performed at [facility]  MRSA PCR Screening     Status: None   Collection Time: 08/06/16  6:23 AM  Result Value Ref Range Status   MRSA by PCR NEGATIVE NEGATIVE Final    Comment:  The GeneXpert MRSA Assay (FDA approved for NASAL specimens only), is one component of a comprehensive MRSA colonization surveillance program. It is not intended to diagnose MRSA infection nor to guide or monitor treatment for MRSA infections.       Radiology Studies: Mr Lumbar Spine Wo Contrast  Result Date: 08/05/2016 CLINICAL DATA:  End-stage renal disease use, HIV. Severe low back pain. EXAM: MRI LUMBAR SPINE WITHOUT CONTRAST TECHNIQUE: Multiplanar, multisequence MR imaging of the lumbar spine was performed. No intravenous contrast was administered. COMPARISON:  None. FINDINGS: Segmentation: Normal. The lowest disc space is considered to be L5-S1. Alignment:  Grade 1 anterolisthesis at L5-S1 Vertebrae: There is diffusely heterogeneous marrow signal throughout the visualized vertebral column. There is no evidence of discitis osteomyelitis. No epidural collection. Conus medullaris: Extends to the L1 level and appears normal. Paraspinal and other soft tissues: The kidneys are atrophic with multiple renal cyst. The other visualized retroperitoneal structures are unremarkable. Disc levels: T12-L1: Evaluated on sagittal images only. No significant disc herniation, spinal canal stenosis or neuroforaminal narrowing. L1-L2: Normal disc space and facet joints. No spinal canal stenosis. No neuroforaminal stenosis. L2-L3: Normal disc space and facet joints. No spinal canal stenosis. No neuroforaminal stenosis. L3-L4: Normal  disc space and facet joints. No spinal canal stenosis. No neuroforaminal stenosis. L4-L5: Narrowing of the intervertebral disc space with minimal bulge. Moderate bilateral facet hypertrophy. No spinal canal stenosis. Moderate right and severe left neuroforaminal stenosis. L5-S1: Disc bulge with superimposed central disc protrusion. No spinal canal stenosis. Severe right and moderate left neuroforaminal stenosis. IMPRESSION: 1. Moderately severe bilateral neural foraminal stenosis at L4-L5 and L5-S1. 2. Diffusely heterogeneous marrow signal is likely secondary to chronic anemia and/or dialysis. Marrow replacement disorders may cause a similar appearance. Electronically Signed   By: Ulyses Jarred M.D.   On: 08/05/2016 17:49     Medications:  Scheduled: . sodium chloride   Intravenous Once  . sodium chloride   Intravenous Once  . sodium chloride   Intravenous Once  . sodium chloride   Intravenous Once  . sodium chloride   Intravenous Once  . amLODipine  5 mg Oral BID  . calcium acetate  1,334 mg Oral Q lunch  . carvedilol  25 mg Oral BID WC  . darbepoetin (ARANESP) injection - DIALYSIS  200 mcg Intravenous Q Thu-HD  . heparin  5,000 Units Subcutaneous Q8H  . hydrALAZINE  100 mg Oral TID  . lamiVUDine  25 mg Oral Daily  . levofloxacin (LEVAQUIN) IV  500 mg Intravenous Q48H  . methocarbamol  750 mg Oral QID  . multivitamin  1 tablet Oral QHS  . predniSONE  60 mg Oral Q breakfast  . raltegravir  400 mg Oral BID  . [START ON 08/08/2016] tenofovir  300 mg Oral Q Sat  . vancomycin      . vancomycin  1,000 mg Intravenous Q T,Th,Sa-HD   Continuous:  OMV:[id number], HYDROmorphone (DILAUDID) injection, ipratropium-albuterol, oxyCODONE-acetaminophen, traZODone  Assessment/Plan:  Principal Problem:   Symptomatic anemia Active Problems:   End stage renal disease on home HD   Essential hypertension   HIV disease (HCC)   Back pain   Pulmonary nodules   CAP (community acquired  pneumonia)    Bacteremia with Coagulase-negative staph species Patient has AV graft through which he dialyzes himself at home. Could be the source of his bacteremia. Continue vancomycin for now till final identification is available. Transthoracic echocardiogram is pending. He will need repeat blood cultures. Will need discussion with ID,  depending on final identification.  Low back pain. Back pain has been present only for the last few days. Patient denied any falls or injuries. Due to his bacteremia there was concern for discitis, so patient underwent MRI of his lumbar spine which did not reveal any evidence for the same. However, it does revealed disc disease and foraminal stenosis. Patient's movement has been limited due to pain. However, he does not have any urinary incontinence or other complaints. We will discuss with neurosurgery as to further management. Pain medications to be continued. Discussed with Dr. Kathyrn Sheriff with neurosurgery. He reviewed the MRI as well. No clear indication for any surgical intervention. For now, we will do PT and OT, muscle relaxants, pain medications. The only other thing that could be attempted is an epidural injection which may not be a good idea at this time due to his bacteremia.  Symptomatic anemia Has chronic anemia secondary to ESRD. Came in with hemoglobin of 6.7. Patient was transfused 2 units of blood. No evidence of overt bleeding. Hemoglobin noted to be slightly low today. Repeat tomorrow morning.  Fever Likely due to bacteremia   Mild acute bronchitis Symptoms appear to be improving. Continue Levaquin.   ESRD Nephrology is following. Continue to monitor closely. Patient is usually on home dialysis. If he needs long-term IV antibiotics, he may need to go to a dialysis center to get IV antibiotics. Nephrology to address this further depending on results of blood culture.  HIV CD4 count is 360 in September 2017. Continue ARVs.   Essential  hypertension  Continue home medications.  Multiple Pulmonary nodules This is an incidental finding seen on CT scan. Per radiology, repeat CT chest in 3-6 months.   DVT Prophylaxis: Subcutaneous heparin    Code Status: Full code  Family Communication: Discussed with the patient  Disposition Plan: Management as outlined above. Continue IV antibiotics. Await final identification of bacteria. Will order repeat blood cultures. We'll discuss with neurosurgery.    LOS: 2 days   [facility] Pager [phone] 08/06/2016, 1:11 PM  If 7PM-7AM, please contact night-coverage at [email or web address], password [facility]

## 2016-08-06 NOTE — Progress Notes (Signed)
[name] Progress Note   Subjective: no new c/o, MRI neg for discitis  Vitals:   08/06/16 1041 08/06/16 1043 08/06/16 1100 08/06/16 1111  BP: (!) 145/72 (!) 155/74 (!) 143/64 (!) 174/92  Pulse: 77 76 75 80  Resp: 11 11 15  (!) 21  Temp: 98.1 F (36.7 C) 98.3 F (36.8 C) 98.7 F (37.1 C) 98.2 F (36.8 C)  TempSrc: Oral Oral Oral Oral  SpO2:    95%  Weight:    84.5 kg (186 lb 4.6 oz)  Height:        Inpatient medications: . sodium chloride   Intravenous Once  . sodium chloride   Intravenous Once  . sodium chloride   Intravenous Once  . sodium chloride   Intravenous Once  . sodium chloride   Intravenous Once  . amLODipine  5 mg Oral BID  . calcium acetate  1,334 mg Oral Q lunch  . carvedilol  25 mg Oral BID WC  . darbepoetin (ARANESP) injection - DIALYSIS  200 mcg Intravenous Q Thu-HD  . heparin  5,000 Units Subcutaneous Q8H  . hydrALAZINE  100 mg Oral TID  . lamiVUDine  25 mg Oral Daily  . levofloxacin (LEVAQUIN) IV  500 mg Intravenous Q48H  . methocarbamol  750 mg Oral QID  . multivitamin  1 tablet Oral QHS  . oxyCODONE-acetaminophen  1 tablet Oral TID  . polyethylene glycol  17 g Oral Daily  . predniSONE  60 mg Oral Q breakfast  . raltegravir  400 mg Oral BID  . senna  1 tablet Oral Daily  . [START ON 08/08/2016] tenofovir  300 mg Oral Q Sat  . vancomycin      . vancomycin  1,000 mg Intravenous Q T,Th,Sa-HD    cloNIDine, HYDROmorphone (DILAUDID) injection, ipratropium-albuterol, oxyCODONE-acetaminophen, traZODone  Exam: Alert, no distress, calm, WDWN, having back spasms No jvd Chest clear bilat RRR no mrg Abd soft ntnd no ascites Back tender mid-thoracic down to LS spine NF, ox 3 LFA AVF buttonholes very small and no drainage / erythema/ fluctuance   Dialysis: Home HD ([facility])  Usually does HD on T/Th/Sat/Sun sched 86kg  Hep 6000   LFA AVF 15ga buttonhole  - Mircera 225 q 2wks  - Venofer 100 mg per wk      Assessment: 1. Staph  bacteremia - awaiting speciation, is IV abx per primary 2. Back pain - MRI neg 3. Anemia - unclear cause, on max ESA, sp 4u prbc's (2 today and 2 on 11/14)  4. HIV - counts are good 5. HTN - cont meds, bp's up some 6. Volume is 1-2 under dry wt 7. ESRD on home HD - TTS here for now  Plan - HD today, prbc's, abx   Kelly Splinter MD [facility] pager [phone]   08/06/2016, 4:15 PM    Recent Labs Lab 08/03/16 2111 08/04/16 1425 08/06/16 0726  NA 134* 134* 133*  K 4.6 4.7 3.7  CL 97* 98* 95*  CO2 23 23 26   GLUCOSE 92 93 109*  BUN 81* 83* 57*  CREATININE 13.65* 15.56* 10.37*  CALCIUM 9.5 9.4 9.1  PHOS  --  4.5 5.0*    Recent Labs Lab 08/04/16 1425 08/06/16 0726  ALBUMIN 3.2* 3.1*    Recent Labs Lab 08/03/16 2111 08/04/16 1425 08/05/16 0901 08/06/16 0726  WBC 14.3* 15.0* 10.3 11.2*  NEUTROABS 12.1*  --   --   --   HGB 6.7* 5.5* 7.5* 7.0*  HCT  18.9* 16.8* 20.9* 19.8*  MCV 95.9 88.0 94.1 92.5  PLT 162 142* 132* 152   Iron/TIBC/Ferritin/ %Sat    Component Value Date/Time   IRON 192 (H) 01/10/2013 0706   TIBC NOT CALC 01/10/2013 0706   FERRITIN 316 01/10/2013 0706   IRONPCTSAT NOT CALC 01/10/2013 0706

## 2016-08-06 NOTE — Progress Notes (Signed)
Patient off unit to HD via bed. Alert and oriented.

## 2016-08-07 ENCOUNTER — Telehealth: Payer: Self-pay | Admitting: *Deleted

## 2016-08-07 LAB — CULTURE, BLOOD (ROUTINE X 2)

## 2016-08-07 LAB — TYPE AND SCREEN
ABO/RH(D): O NEG
ANTIBODY SCREEN: NEGATIVE
UNIT DIVISION: 0
UNIT DIVISION: 0
Unit division: 0
Unit division: 0

## 2016-08-07 MED ORDER — CEFAZOLIN SODIUM-DEXTROSE 2-4 GM/100ML-% IV SOLN
2.0000 g | INTRAVENOUS | Status: DC
  Administered 2016-08-08: 2 g via INTRAVENOUS
  Filled 2016-08-07 (×3): qty 100

## 2016-08-07 MED ORDER — SORBITOL 70 % SOLN
30.0000 mL | Freq: Once | Status: AC
  Administered 2016-08-07: 30 mL via ORAL
  Filled 2016-08-07: qty 30

## 2016-08-07 MED ORDER — CEFAZOLIN SODIUM-DEXTROSE 2-4 GM/100ML-% IV SOLN: 2.0000 g | INTRAVENOUS | 8 refills | Status: DC

## 2016-08-07 NOTE — Care Management Note (Signed)
Case Management Note  Patient Details  Name: Jeffrey [name] MRN: [id number] Date of Birth: 27-Nov-1955  Subjective/Objective:  ESRD, Bacteremia                   Action/Plan: Discharge Planning:  NCM spoke to pt and lives at home with brother. Offered choice for HH/provided [facility] list. Pt agreeable to [facility] for [facility] RN and IV abx. Requesting RW and shower stool. Contacted [facility] Liaison for IV abx and to follow up to see if pt can use his HD system to administer IV abx. Contacted [facility] DME rep for equipment for home.   Cristy Hilts MD   Expected Discharge Date:                  Expected Discharge Plan:  [place]  In-House Referral:  NA  Discharge planning Services  CM Consult  Post Acute Care Choice:  Home Health Choice offered to:  Patient  DME Arranged:  [place] rolling, Shower stool DME Agency:  [place]:  RN, PT, OT, IV Antibiotics HH Agency:  [place]  Status of Service:  In process, will continue to follow  If discussed at Long Length of Stay Meetings, dates discussed:    Additional Comments:  Erenest Rasher, RN 08/07/2016, 3:19 PM

## 2016-08-07 NOTE — Telephone Encounter (Signed)
Patient in in the hospital since Monday 08/03/16. He is afraid and does not understand what is going on. He advised he has had every test known to man and no diagnosis yet. He wondered why no one from the clinic has been in to see him since the doctor there thinks it is an blood infection if some kind. Advised him when he is in the hospital until they call for a consult our doctor do not get involved. Reassured him that they will notify the ID doctors once they find out what bug they are dealing with. He advised he understands but would like for [facility] to call on him if possible. Advised him will let Dr Johnnye Sima know he is in and because Im not sure where he is today Im sure if he has time he will get by there. He was calmed by that answer.

## 2016-08-07 NOTE — Care Management Important Message (Signed)
Important Message  Patient Details  Name: Jeffrey [name] MRN: [id number] Date of Birth: 03/23/1956   Medicare Important Message Given:  Yes    [name] 08/07/2016, 3:19 PM

## 2016-08-07 NOTE — Progress Notes (Signed)
[facility] Progress Note  Dialysis Orders: Home HD (Nxt [facility])  Usually does HD on T/Th/Sat/Sun sched 86kg  Hep 6000   LFA AVF 15ga buttonhole  - Mircera 225 q 2wks  - Venofer 100 mg per wk  Assessment/Plan: 1. Coag neg staph bacteremia - IV abx per primary on Vanc/Levo  2. Back pain - MRI neg  3. ESRD - home HD TTS schedule here / plan HD tomorrow  4. Anemia - max OP ESA s/p 4 units PRBCs (2 on 11/15 and 2 on 11/16) Aranesp 200 IV 11/16-  no updated result for today  5. Mineral bone disease -  No VDRA/Ca acetate binder  6. HTN/volume - BP slightly elevated/cont home meds/ HD yesterday net UF 1L post weight 84.5kg - 1.5 below kg  7. Nutrition - Renal diet/vitamins 8. HIV - per primary    Lynnda Child PA-C [facility] Pager [phone] 08/07/2016,11:40 AM  LOS: 3 days   Pt seen, examined and agree w A/P as above.  Kelly Splinter MD [facility] pager [phone]   08/07/2016, 2:19 PM    Subjective: seen at bedside some back pain no new c/os today   Objective Vitals:   08/06/16 1715 08/06/16 2115 08/07/16 0558 08/07/16 0931  BP: (!) 157/84 (!) 146/77 (!) 160/80 (!) 154/75  Pulse: 77 78 73 70  Resp: 18 19 18 18   Temp: 98.4 F (36.9 C) 97.6 F (36.4 C) 98.6 F (37 C) 98.8 F (37.1 C)  TempSrc: Oral Oral Oral Oral  SpO2: 96% 95% 97% 98%  Weight:  86.4 kg (190 lb 7.6 oz)    Height:       Physical Exam General: Alert NAD Heart: RRR Lungs: CTAB  Abdomen: soft NT/ND Extremities: no LE edema  Dialysis Access: LFA AVF   Additional Objective Labs: Basic Metabolic Panel:  Recent Labs Lab 08/03/16 2111 08/04/16 1425 08/06/16 0726  NA 134* 134* 133*  K 4.6 4.7 3.7  CL 97* 98* 95*  CO2 23 23 26   GLUCOSE 92 93 109*  BUN 81* 83* 57*  CREATININE 13.65* 15.56* 10.37*  CALCIUM 9.5 9.4 9.1  PHOS  --  4.5 5.0*   Liver Function Tests:  Recent Labs Lab 08/04/16 1425 08/06/16 0726  ALBUMIN 3.2* 3.1*   No  results for input(s): LIPASE, AMYLASE in the last 168 hours. CBC:  Recent Labs Lab 08/03/16 2111 08/04/16 1425 08/05/16 0901 08/06/16 0726  WBC 14.3* 15.0* 10.3 11.2*  NEUTROABS 12.1*  --   --   --   HGB 6.7* 5.5* 7.5* 7.0*  HCT 18.9* 16.8* 20.9* 19.8*  MCV 95.9 88.0 94.1 92.5  PLT 162 142* 132* 152   Blood Culture    Component Value Date/Time   SDES BLOOD RIGHT FOREARM 08/04/2016 0036   SPECREQUEST BOTTLES DRAWN AEROBIC AND ANAEROBIC 5ML 08/04/2016 0036   CULT STAPHYLOCOCCUS SPECIES (COAGULASE NEGATIVE) (A) 08/04/2016 0036   REPTSTATUS 08/07/2016 FINAL 08/04/2016 0036    Cardiac Enzymes: No results for input(s): CKTOTAL, CKMB, CKMBINDEX, TROPONINI in the last 168 hours. CBG:  Recent Labs Lab 08/05/16 1136  GLUCAP 109*   Iron Studies: No results for input(s): IRON, TIBC, TRANSFERRIN, FERRITIN in the last 72 hours. Lab Results  Component Value Date   INR 1.17 01/09/2013   INR 1.27 06/03/2011   INR 1.34 06/02/2011   Medications:  . sodium chloride   Intravenous Once  . sodium chloride   Intravenous Once  . sodium chloride   Intravenous  Once  . sodium chloride   Intravenous Once  . sodium chloride   Intravenous Once  . amLODipine  5 mg Oral BID  . calcium acetate  1,334 mg Oral Q lunch  . carvedilol  25 mg Oral BID WC  . darbepoetin (ARANESP) injection - DIALYSIS  200 mcg Intravenous Q Thu-HD  . heparin  5,000 Units Subcutaneous Q8H  . hydrALAZINE  100 mg Oral TID  . lamiVUDine  25 mg Oral Daily  . levofloxacin (LEVAQUIN) IV  500 mg Intravenous Q48H  . methocarbamol  750 mg Oral QID  . multivitamin  1 tablet Oral QHS  . oxyCODONE-acetaminophen  1 tablet Oral TID  . polyethylene glycol  17 g Oral Daily  . predniSONE  60 mg Oral Q breakfast  . raltegravir  400 mg Oral BID  . senna  1 tablet Oral Daily  . [START ON 08/08/2016] tenofovir  300 mg Oral Q Sat  . vancomycin  1,000 mg Intravenous Q T,Th,Sa-HD

## 2016-08-07 NOTE — Progress Notes (Signed)
PT Cancellation Note  Patient Details Name: [name] MRN: [id number] DOB: [birth year]   Cancelled Treatment:    Reason Eval/Treat Not Completed: Pain limiting ability to participate (pt declined PT x 2 today 2* pain. He received pain medication ~35 min ago.  He requested PT attempt again tomorrow. Will follow. )   [name] 08/07/2016, 2:00 PM [phone]

## 2016-08-07 NOTE — Progress Notes (Signed)
[facility] PROGRESS NOTE  Jeffrey [name] IZT:[id number] DOB: Jun 10, 1956 DOA: 08/03/2016  PCP: Rexene Agent, MD.  Subjective/Interval History: Back pain is improved since yesterday, his phlegm is clearing.  Brief History/Interval Summary: 60 year old African-American male with a past medical history of end-stage renal disease on home hemodialysis, HIV on antiretroviral treatment, presented to the emergency department with complaints of severe low back pain. Patient also had symptoms of cough and shortness of breath. Patient was diagnosed as having bronchitis. He was hospitalized for further management.  Reason for Visit: Bacteremia  Consultants: Nephrology  Procedures: None yet  Antibiotics: Vancomycin and Levaquin  ROS: Denies any chest pain or shortness of breath  Objective: Vital Signs  Vitals:   08/06/16 1715 08/06/16 2115 08/07/16 0558 08/07/16 0931  BP: (!) 157/84 (!) 146/77 (!) 160/80 (!) 154/75  Pulse: 77 78 73 70  Resp: 18 19 18 18   Temp: 98.4 F (36.9 C) 97.6 F (36.4 C) 98.6 F (37 C) 98.8 F (37.1 C)  TempSrc: Oral Oral Oral Oral  SpO2: 96% 95% 97% 98%  Weight:  86.4 kg (190 lb 7.6 oz)    Height:        Intake/Output Summary (Last 24 hours) at 08/07/16 1455 Last data filed at 08/07/16 0932  Gross per 24 hour  Intake              870 ml  Output                0 ml  Net              870 ml   Filed Weights   08/06/16 0700 08/06/16 1111 08/06/16 2115  Weight: 85.7 kg (188 lb 15 oz) 84.5 kg (186 lb 4.6 oz) 86.4 kg (190 lb 7.6 oz)    General appearance: alert, cooperative, appears stated age and no distress Resp: Improved air entry bilaterally. No wheezing heard today. No rhonchi. No definite crackles. Cardio: regular rate and rhythm, S1, S2 normal, no murmur, click, rub or gallop GI: soft, non-tender; bowel sounds normal; no masses,  no organomegaly Extremities: extremities normal, atraumatic, no cyanosis or edema Back: No obvious deformity  noted. No rashes. No swelling. Neurologic: Awake and alert. Oriented 3. Some limitation noted with lifting of the left leg  Lab Results:  Data Reviewed: I have personally reviewed following labs and imaging studies  CBC:  Recent Labs Lab 08/03/16 2111 08/04/16 1425 08/05/16 0901 08/06/16 0726  WBC 14.3* 15.0* 10.3 11.2*  NEUTROABS 12.1*  --   --   --   HGB 6.7* 5.5* 7.5* 7.0*  HCT 18.9* 16.8* 20.9* 19.8*  MCV 95.9 88.0 94.1 92.5  PLT 162 142* 132* 382    Basic Metabolic Panel:  Recent Labs Lab 08/03/16 2111 08/04/16 1425 08/06/16 0723 08/06/16 0726  NA 134* 134*  --  133*  K 4.6 4.7  --  3.7  CL 97* 98*  --  95*  CO2 23 23  --  26  GLUCOSE 92 93  --  109*  BUN 81* 83*  --  57*  CREATININE 13.65* 15.56*  --  10.37*  CALCIUM 9.5 9.4  --  9.1  MG  --   --  1.9  --   PHOS  --  4.5  --  5.0*    GFR: Estimated Creatinine Clearance: 8.1 mL/min (by C-G formula based on SCr of 10.37 mg/dL (H)).  Liver Function Tests:  Recent Labs Lab 08/04/16 1425 08/06/16 0726  ALBUMIN 3.2* 3.1*    CBG:  Recent Labs Lab 08/05/16 1136  GLUCAP 109*     Recent Results (from the past 240 hour(s))  Culture, blood (routine x 2)     Status: Abnormal   Collection Time: 08/04/16 12:25 AM  Result Value Ref Range Status   Specimen Description BLOOD RIGHT ANTECUBITAL  Final   Special Requests BOTTLES DRAWN AEROBIC AND ANAEROBIC 5ML  Final   Culture  Setup Time   Final    GRAM POSITIVE COCCI IN CLUSTERS IN BOTH AEROBIC AND ANAEROBIC BOTTLES CRITICAL VALUE NOTED.  VALUE IS CONSISTENT WITH PREVIOUSLY REPORTED AND CALLED VALUE.    Culture (A)  Final    STAPHYLOCOCCUS SPECIES (COAGULASE NEGATIVE) SUSCEPTIBILITIES PERFORMED ON PREVIOUS CULTURE WITHIN THE LAST 5 DAYS. Performed at [facility]    Report Status 08/07/2016 FINAL  Final  Culture, blood (routine x 2)     Status: Abnormal   Collection Time: 08/04/16 12:36 AM  Result Value Ref Range Status   Specimen  Description BLOOD RIGHT FOREARM  Final   Special Requests BOTTLES DRAWN AEROBIC AND ANAEROBIC 5ML  Final   Culture  Setup Time   Final    GRAM POSITIVE COCCI IN CLUSTERS IN BOTH AEROBIC AND ANAEROBIC BOTTLES CRITICAL RESULT CALLED TO, READ BACK BY AND VERIFIED WITHArnold Long RN AT 0722 08/05/16 BY D. VANHOOK Performed at [facility]    Culture STAPHYLOCOCCUS SPECIES (COAGULASE NEGATIVE) (A)  Final   Report Status 08/07/2016 FINAL  Final   Organism ID, Bacteria STAPHYLOCOCCUS SPECIES (COAGULASE NEGATIVE)  Final      Susceptibility   Staphylococcus species (coagulase negative) - MIC*    CIPROFLOXACIN <=0.5 SENSITIVE Sensitive     ERYTHROMYCIN <=0.25 SENSITIVE Sensitive     GENTAMICIN <=0.5 SENSITIVE Sensitive     OXACILLIN <=0.25 SENSITIVE Sensitive     TETRACYCLINE 2 SENSITIVE Sensitive     VANCOMYCIN 2 SENSITIVE Sensitive     TRIMETH/SULFA <=10 SENSITIVE Sensitive     CLINDAMYCIN <=0.25 SENSITIVE Sensitive     RIFAMPIN <=0.5 SENSITIVE Sensitive     Inducible Clindamycin NEGATIVE Sensitive     * STAPHYLOCOCCUS SPECIES (COAGULASE NEGATIVE)  Blood Culture ID Panel (Reflexed)     Status: Abnormal   Collection Time: 08/04/16 12:36 AM  Result Value Ref Range Status   Enterococcus species NOT DETECTED NOT DETECTED Final   Listeria monocytogenes NOT DETECTED NOT DETECTED Final   Staphylococcus species DETECTED (A) NOT DETECTED Final    Comment: CRITICAL RESULT CALLED TO, READ BACK BY AND VERIFIED WITH: IRichardson Dopp RN AT 6962 08/05/16 BY D. VANHOOK    Staphylococcus aureus NOT DETECTED NOT DETECTED Final   Methicillin resistance NOT DETECTED NOT DETECTED Final   Streptococcus species NOT DETECTED NOT DETECTED Final   Streptococcus agalactiae NOT DETECTED NOT DETECTED Final   Streptococcus pneumoniae NOT DETECTED NOT DETECTED Final   Streptococcus pyogenes NOT DETECTED NOT DETECTED Final   Acinetobacter baumannii NOT DETECTED NOT DETECTED Final   Enterobacteriaceae species NOT  DETECTED NOT DETECTED Final   Enterobacter cloacae complex NOT DETECTED NOT DETECTED Final   Escherichia coli NOT DETECTED NOT DETECTED Final   Klebsiella oxytoca NOT DETECTED NOT DETECTED Final   Klebsiella pneumoniae NOT DETECTED NOT DETECTED Final   Proteus species NOT DETECTED NOT DETECTED Final   Serratia marcescens NOT DETECTED NOT DETECTED Final   Haemophilus influenzae NOT DETECTED NOT DETECTED Final   Neisseria meningitidis NOT DETECTED NOT DETECTED Final   Pseudomonas aeruginosa NOT  DETECTED NOT DETECTED Final   Candida albicans NOT DETECTED NOT DETECTED Final   Candida glabrata NOT DETECTED NOT DETECTED Final   Candida krusei NOT DETECTED NOT DETECTED Final   Candida parapsilosis NOT DETECTED NOT DETECTED Final   Candida tropicalis NOT DETECTED NOT DETECTED Final    Comment: Performed at [facility]  MRSA PCR Screening     Status: None   Collection Time: 08/06/16  6:23 AM  Result Value Ref Range Status   MRSA by PCR NEGATIVE NEGATIVE Final    Comment:        The GeneXpert MRSA Assay (FDA approved for NASAL specimens only), is one component of a comprehensive MRSA colonization surveillance program. It is not intended to diagnose MRSA infection nor to guide or monitor treatment for MRSA infections.       Radiology Studies: Mr Lumbar Spine Wo Contrast  Result Date: 08/05/2016 CLINICAL DATA:  End-stage renal disease use, HIV. Severe low back pain. EXAM: MRI LUMBAR SPINE WITHOUT CONTRAST TECHNIQUE: Multiplanar, multisequence MR imaging of the lumbar spine was performed. No intravenous contrast was administered. COMPARISON:  None. FINDINGS: Segmentation: Normal. The lowest disc space is considered to be L5-S1. Alignment:  Grade 1 anterolisthesis at L5-S1 Vertebrae: There is diffusely heterogeneous marrow signal throughout the visualized vertebral column. There is no evidence of discitis osteomyelitis. No epidural collection. Conus medullaris: Extends to the L1 level  and appears normal. Paraspinal and other soft tissues: The kidneys are atrophic with multiple renal cyst. The other visualized retroperitoneal structures are unremarkable. Disc levels: T12-L1: Evaluated on sagittal images only. No significant disc herniation, spinal canal stenosis or neuroforaminal narrowing. L1-L2: Normal disc space and facet joints. No spinal canal stenosis. No neuroforaminal stenosis. L2-L3: Normal disc space and facet joints. No spinal canal stenosis. No neuroforaminal stenosis. L3-L4: Normal disc space and facet joints. No spinal canal stenosis. No neuroforaminal stenosis. L4-L5: Narrowing of the intervertebral disc space with minimal bulge. Moderate bilateral facet hypertrophy. No spinal canal stenosis. Moderate right and severe left neuroforaminal stenosis. L5-S1: Disc bulge with superimposed central disc protrusion. No spinal canal stenosis. Severe right and moderate left neuroforaminal stenosis. IMPRESSION: 1. Moderately severe bilateral neural foraminal stenosis at L4-L5 and L5-S1. 2. Diffusely heterogeneous marrow signal is likely secondary to chronic anemia and/or dialysis. Marrow replacement disorders may cause a similar appearance. Electronically Signed   By: Ulyses Jarred M.D.   On: 08/05/2016 17:49     Medications:  Scheduled: . sodium chloride   Intravenous Once  . sodium chloride   Intravenous Once  . sodium chloride   Intravenous Once  . sodium chloride   Intravenous Once  . sodium chloride   Intravenous Once  . amLODipine  5 mg Oral BID  . calcium acetate  1,334 mg Oral Q lunch  . carvedilol  25 mg Oral BID WC  . darbepoetin (ARANESP) injection - DIALYSIS  200 mcg Intravenous Q Thu-HD  . heparin  5,000 Units Subcutaneous Q8H  . hydrALAZINE  100 mg Oral TID  . lamiVUDine  25 mg Oral Daily  . levofloxacin (LEVAQUIN) IV  500 mg Intravenous Q48H  . methocarbamol  750 mg Oral QID  . multivitamin  1 tablet Oral QHS  . oxyCODONE-acetaminophen  1 tablet Oral TID  .  polyethylene glycol  17 g Oral Daily  . predniSONE  60 mg Oral Q breakfast  . raltegravir  400 mg Oral BID  . senna  1 tablet Oral Daily  . [START ON 08/08/2016] tenofovir  300 mg Oral Q Sat  . vancomycin  1,000 mg Intravenous Q T,Th,Sa-HD   Continuous:  OMV:EHMCNOBSJ, HYDROmorphone (DILAUDID) injection, ipratropium-albuterol, oxyCODONE-acetaminophen, traZODone  Assessment/Plan:  Principal Problem:   Symptomatic anemia Active Problems:   End stage renal disease on home HD   Essential hypertension   HIV disease (HCC)   Back pain   Pulmonary nodules   CAP (community acquired pneumonia)    Bacteremia with methicillin sensitive Coagulase-negative staph species -Patient has AV graft through which he dialyzes himself at home. Could be the source of his bacteremia.  -Continue vancomycin for now till final identification is available.  -2-D echo pending, repeat blood cultures negative so far. -Discussed with Dr. Linus Salmons, will place patient on Ancef for total of 2 weeks.  Low back pain. Back pain has been present only for the last few days. Patient denied any falls or injuries. Due to his bacteremia there was concern for discitis, so patient underwent MRI of his lumbar spine which did not reveal any evidence for the same. However, it does revealed disc disease and foraminal stenosis. Patient's movement has been limited due to pain. However, he does not have any urinary incontinence or other complaints. We will discuss with neurosurgery as to further management. Pain medications to be continued. Discussed with Dr. Kathyrn Sheriff with neurosurgery. He reviewed the MRI as well. No clear indication for any surgical intervention. For now, we will do PT and OT, muscle relaxants, pain medications. The only other thing that could be attempted is an epidural injection which may not be a good idea at this time due to his bacteremia.  Symptomatic anemia Has chronic anemia secondary to ESRD. Came in with  hemoglobin of 6.7. Patient was transfused 2 units of blood. No evidence of overt bleeding. Hemoglobin noted to be slightly low today. Repeat tomorrow morning.  Fever Likely due to bacteremia   Mild acute bronchitis Symptoms appear to be improving. Continue Levaquin.   ESRD Nephrology is following. Continue to monitor closely. Patient is usually on home dialysis. If he needs long-term IV antibiotics, he may need to go to a dialysis center to get IV antibiotics. Nephrology to address this further depending on results of blood culture.  HIV CD4 count is 360 in September 2017. Continue ARVs.   Essential hypertension  Continue home medications.  Multiple Pulmonary nodules This is an incidental finding seen on CT scan. Per radiology, repeat CT chest in 3-6 months.   DVT Prophylaxis: Subcutaneous heparin    Code Status: Full code  Family Communication: Discussed with the patient  Disposition Plan: Management as outlined above. Continue IV antibiotics. Await final identification of bacteria. Will order repeat blood cultures. We'll discuss with neurosurgery.    LOS: 3 days   [facility] Pager [phone] 08/07/2016, 2:55 PM  If 7PM-7AM, please contact night-coverage at [email or web address], password [email or web address]

## 2016-08-07 NOTE — Evaluation (Signed)
Occupational Therapy Evaluation Patient Details Name: Jeffrey [name] MRN: [id number] DOB: 1956/04/19 Today's Date: 08/07/2016    History of Present Illness Jeffrey [name] is a 60 y.o. male with medical history significant of ESRD on home HD, HIV on HAART.  Patient presents to the ED with c/o severe low back pain   Clinical Impression   Pt with decline in function and safety with ADLs and ADL mobility with decreased activity tolerance and balance. Pt is limited bb low back pain and balance impairments during ADL mobility using RW. Pt would benefit from acute OT services to address impairments to in increase level of function and safety    Follow Up Recommendations  Home health OT    Equipment Recommendations  3 in 1 bedside comode;Tub/shower seat;Other (comment) Management consultant)    Recommendations for Other Services PT consult     Precautions / Restrictions Precautions Precautions: Fall Restrictions Weight Bearing Restrictions: No      Mobility Bed Mobility Overal bed mobility: Needs Assistance Bed Mobility: Rolling;Sidelying to Sit;Sit to Sidelying Rolling: Supervision (used rails) Sidelying to sit: Min guard (used rails)     Sit to sidelying: Mod assist General bed mobility comments: pt educated on log roll technique. Assist with LEs back onto bed  Transfers Overall transfer level: Needs assistance   Transfers: Sit to/from Stand Sit to Stand: Min assist         General transfer comment: cues for correct hand placement and posture, cues to use grab bars    Balance Overall balance assessment: Needs assistance   Sitting balance-Leahy Scale: Good       Standing balance-Leahy Scale: Poor Standing balance comment: very unsteady with RW                            ADL Overall ADL's : Needs assistance/impaired     Grooming: Wash/dry hands;Wash/dry face;Standing;Min guard   Upper Body Bathing: Set up;Supervision/ safety;Sitting   Lower Body Bathing:  Moderate assistance   Upper Body Dressing : Set up;Supervision/safety;Sitting   Lower Body Dressing: Moderate assistance   Toilet Transfer: Minimal assistance;RW;Ambulation;Comfort height toilet;Grab bars;Cueing for safety   Toileting- Clothing Manipulation and Hygiene: Minimal assistance   Tub/ Shower Transfer: 3 in 1;Minimal assistance;Grab bars;Ambulation;Rolling walker;Cueing for safety   Functional mobility during ADLs: Minimal assistance;Cueing for safety;Rolling walker       Vision Vision Assessment?: No apparent visual deficits          Pertinent Vitals/Pain Pain Assessment: 0-10 Pain Score: 6  Pain Location: low back Pain Descriptors / Indicators: Aching;Tightness;Sore Pain Intervention(s): Limited activity within patient's tolerance;Monitored during session;Repositioned;Ice applied     Hand Dominance Right   Extremity/Trunk Assessment Upper Extremity Assessment Upper Extremity Assessment: Overall WFL for tasks assessed   Lower Extremity Assessment Lower Extremity Assessment: Defer to PT evaluation   Cervical / Trunk Assessment Cervical / Trunk Assessment: Normal   Communication Communication Communication: No difficulties   Cognition Arousal/Alertness: Awake/alert Behavior During Therapy: WFL for tasks assessed/performed Overall Cognitive Status: Within Functional Limits for tasks assessed                     General Comments   Pt pleasant and cooperative                 Home Living Family/patient expects to be discharged to:: Private residence Living Arrangements: Other relatives (brother) Available Help at Discharge: Family;Available PRN/intermittently Type of Home: House (2  story townhome) Home Access: Stairs to enter [employer] of Steps: 3   Home Layout: Able to live on main level with bedroom/bathroom;Full bath on main level     Bathroom Shower/Tub: Walk-in shower         Home Equipment: None           Prior Functioning/Environment Level of Independence: Independent                 OT Problem List: Impaired balance (sitting and/or standing);Pain;Decreased activity tolerance;Decreased knowledge of use of DME or AE   OT Treatment/Interventions: Self-care/ADL training;DME and/or AE instruction;Therapeutic activities;Balance training;Patient/family education    OT Goals(Current goals can be found in the care plan section) Acute Rehab OT Goals Patient Stated Goal: get better and go home OT Goal Formulation: With patient/family Time For Goal Achievement: 08/14/16 Potential to Achieve Goals: Good ADL Goals Pt Will Perform Grooming: with supervision;with set-up;standing Pt Will Perform Upper Body Bathing: with set-up Pt Will Perform Lower Body Bathing: with min assist;with min guard assist Pt Will Perform Upper Body Dressing: with set-up Pt Will Perform Lower Body Dressing: with min assist;with min guard assist Pt Will Transfer to Toilet: with min guard assist;with supervision;ambulating;grab bars (3 in 1) Pt Will Perform Toileting - Clothing Manipulation and hygiene: with min guard assist;with supervision;sit to/from stand Pt Will Perform Tub/Shower Transfer: with min guard assist;with supervision;shower seat  OT Frequency: Min 2X/week   Barriers to D/C:    no barriers                     End of Session Equipment Utilized During Treatment: Gait belt;Rolling walker;Other (comment) (3 in 1)  Activity Tolerance: Patient tolerated treatment well Patient left: in bed;with call bell/phone within reach;with family/visitor present   Time: 4496-7591 OT Time Calculation (min): 26 min Charges:  OT General Charges $OT Visit: 1 Procedure OT Evaluation $OT Eval Moderate Complexity: 1 Procedure OT Treatments $Therapeutic Activity: 8-22 mins G-Codes:    Britt Bottom 08/07/2016, 12:40 PM

## 2016-08-07 NOTE — Progress Notes (Signed)
Pharmacy Antibiotic Note  Jeffrey [name] is a 60 y.o. male with ESRD admitted on 08/03/2016 with cough and back pain. Pharmacy was originally consulted for Levaquin for r/o PNA. The patient is now noted to have positive BCx with CoagNegative Staph- was originally started on vancomycin, but now to narrow therapy to Ancef based on ID recommendations.  ESRD on home HD-T/Th/Sat/Sun - last HD here was on 11/16- 4h with BFR 463mL/min.   Plan: -stop Vancomycin and Levaquin -Ancef 2g IV qHD-TTSat -Follow HD schedule/tolerance  Height: 6' (182.9 cm) Weight: 190 lb 7.6 oz (86.4 kg) IBW/kg (Calculated) : 77.6  Temp (24hrs), Avg:98.4 F (36.9 C), Min:97.6 F (36.4 C), Max:98.8 F (37.1 C)   Recent Labs Lab 08/03/16 2111 08/04/16 1425 08/05/16 0901 08/06/16 0726  WBC 14.3* 15.0* 10.3 11.2*  CREATININE 13.65* 15.56*  --  10.37*    Estimated Creatinine Clearance: 8.1 mL/min (by C-G formula based on SCr of 10.37 mg/dL (H)).    Allergies  Allergen Reactions  . Lisinopril Shortness Of Breath and Swelling    Throat swelling  . Penicillins Other (See Comments)    Childhood allergy Has patient had a PCN reaction causing immediate rash, facial/tongue/throat swelling, SOB or lightheadedness with hypotension: Unknown Has patient had a PCN reaction causing severe rash involving mucus membranes or skin necrosis: Unknown Has patient had a PCN reaction that required hospitalization: Unknown Has patient had a PCN reaction occurring within the last 10 years: Unknown If all of the above answers are "NO", then may proceed with Cephalosporin use.     Antimicrobials this admission: Vancomycin 11/15 >>11/17 Levofloxacin 11/14  >> 11/17 Ancef 11/17>>   Dose adjustments this admission: n/a  Microbiology results: 11/14 BCx: 2/2 CoNS, pan sens 11/14 Sputum: sent  11/16 MRSA PCR: neg 11/17 BCx: sent   Thank you for allowing pharmacy to be a part of this patient's care.  Terre Hanneman D. Deziyah Arvin,  PharmD, BCPS Clinical Pharmacist Pager: [phone] 08/07/2016 4:08 PM

## 2016-08-08 ENCOUNTER — Inpatient Hospital Stay (HOSPITAL_COMMUNITY): Payer: Medicare Other

## 2016-08-08 DIAGNOSIS — R7881 Bacteremia: Secondary | ICD-10-CM

## 2016-08-08 LAB — ECHOCARDIOGRAM COMPLETE
AVPHT: 314 ms
CHL CUP MV DEC (S): 156
E decel time: 156 msec
E/e' ratio: 12.75
FS: 37 % (ref 28–44)
HEIGHTINCHES: 72 in
IV/PV OW: 1.09
LA ID, A-P, ES: 46 mm
LA vol index: 66.2 mL/m2
LADIAMINDEX: 2.22 cm/m2
LAVOL: 137 mL
LAVOLA4C: 138 mL
LEFT ATRIUM END SYS DIAM: 46 mm
LVEEAVG: 12.75
LVEEMED: 12.75
LVELAT: 9.57 cm/s
LVOT area: 4.15 cm2
LVOT diameter: 23 mm
Lateral S' vel: 17.7 cm/s
MV Peak grad: 6 mmHg
MVPKAVEL: 101 m/s
MVPKEVEL: 122 m/s
PW: 11.9 mm — AB (ref 0.6–1.1)
RV TAPSE: 26.4 mm
TDI e' lateral: 9.57
TDI e' medial: 5.66
WEIGHTICAEL: 2994.73 [oz_av]

## 2016-08-08 LAB — RENAL FUNCTION PANEL
ALBUMIN: 3.3 g/dL — AB (ref 3.5–5.0)
ANION GAP: 13 (ref 5–15)
BUN: 58 mg/dL — AB (ref 6–20)
CO2: 26 mmol/L (ref 22–32)
Calcium: 8.7 mg/dL — ABNORMAL LOW (ref 8.9–10.3)
Chloride: 92 mmol/L — ABNORMAL LOW (ref 101–111)
Creatinine, Ser: 8.95 mg/dL — ABNORMAL HIGH (ref 0.61–1.24)
GFR calc Af Amer: 6 mL/min — ABNORMAL LOW (ref >60)
GFR calc non Af Amer: 6 mL/min — ABNORMAL LOW (ref >60)
GLUCOSE: 105 mg/dL — AB (ref 65–99)
PHOSPHORUS: 4.5 mg/dL (ref 2.5–4.6)
POTASSIUM: 4 mmol/L (ref 3.5–5.1)
Sodium: 131 mmol/L — ABNORMAL LOW (ref 135–145)

## 2016-08-08 LAB — CBC
HEMATOCRIT: 24.2 % — AB (ref 39.0–52.0)
HEMOGLOBIN: 8.5 g/dL — AB (ref 13.0–17.0)
MCH: 31.7 pg (ref 26.0–34.0)
MCHC: 35.1 g/dL (ref 30.0–36.0)
MCV: 90.3 fL (ref 78.0–100.0)
Platelets: 216 10*3/uL (ref 150–400)
RBC: 2.68 MIL/uL — ABNORMAL LOW (ref 4.22–5.81)
RDW: 16.3 % — AB (ref 11.5–15.5)
WBC: 11.7 10*3/uL — ABNORMAL HIGH (ref 4.0–10.5)

## 2016-08-08 MED ORDER — LIDOCAINE HCL (PF) 1 % IJ SOLN: 5.0000 mL | INTRAMUSCULAR | Status: DC | PRN

## 2016-08-08 MED ORDER — PENTAFLUOROPROP-TETRAFLUOROETH EX AERO: 1.0000 "application " | INHALATION_SPRAY | CUTANEOUS | Status: DC | PRN

## 2016-08-08 MED ORDER — LIDOCAINE-PRILOCAINE 2.5-2.5 % EX CREA: 1.0000 "application " | TOPICAL_CREAM | CUTANEOUS | Status: DC | PRN

## 2016-08-08 MED ORDER — SODIUM CHLORIDE 0.9 % IV SOLN: 100.0000 mL | INTRAVENOUS | Status: DC | PRN

## 2016-08-08 MED ORDER — HEPARIN SODIUM (PORCINE) 1000 UNIT/ML DIALYSIS: 1000.0000 [IU] | INTRAMUSCULAR | Status: DC | PRN

## 2016-08-08 MED ORDER — OXYCODONE-ACETAMINOPHEN 5-325 MG PO TABS: 1.0000 | ORAL_TABLET | Freq: Four times a day (QID) | ORAL | 0 refills | Status: DC | PRN

## 2016-08-08 NOTE — Progress Notes (Signed)
  Echocardiogram 2D Echocardiogram has been performed.  Jeffrey [name] 08/08/2016, 11:32 AM

## 2016-08-08 NOTE — Discharge Summary (Signed)
Physician Discharge Summary  BURYL BAMBER KPT:[id number] DOB: September 21, 1956 DOA: 08/03/2016  PCP: Rexene Agent, MD  Admit date: 08/03/2016 Discharge date: 08/08/2016  Admitted From: Home Disposition: Home  Recommendations for Outpatient Follow-up:  1. Follow up with PCP in 1-2 weeks 2. Please obtain BMP/CBC in one week 3. Patient referred to cardiology as outpatient for consideration of TEE.  Home Health: NA Equipment/Devices:NA  Discharge Condition: Stable CODE STATUS: Full Code Diet recommendation: Diet renal with fluid restriction Fluid restriction: 1200 mL Fluid; Room service appropriate? Yes; Fluid consistency: Thin Diet - low sodium heart healthy  Brief/Interim Summary: 60 year old African-American male with a past medical history of end-stage renal disease on home hemodialysis, HIV on antiretroviral treatment, presented to the emergency department with complaints of severe low back pain. Patient also had symptoms of cough and shortness of breath. Patient was diagnosed as having bronchitis. He was hospitalized for further management.  Discharge Diagnoses:  Principal Problem:   Symptomatic anemia Active Problems:   End stage renal disease on home HD   Essential hypertension   HIV disease (HCC)   Back pain   Pulmonary nodules   CAP (community acquired pneumonia)   Bacteremia with methicillin sensitive Coagulase-negative staph species -Patient has AV graft through which he dialyzes himself at home. Likely to be the source of his bacteremia.  -Initially was on vancomycin, repeat blood culture NGTD.  -2-D echo Done results came after discharge, showed a AI which worsened since 7/17. -Discussed with Dr. Linus Salmons, recommended Ancef for total of 2 weeks. -Ancef to be 2 g after each dialysis, patient does his on dialysis at home. -Initial antibiotics written for 2 weeks. -With the 2-D echo showed questionable vegetation extended antibiotics to 6 weeks, TEE to be done as  outpatient. -I referred the patient to cardiology clinic, communicated this finding with nephrology service Dr. Jonnie Finner and his primary ID physician Dr. Baxter Flattery.  Questionable aortic valve vegetation -2-D echo was done on the day of discharge, results reported after discharge. -There is AI which was worse since 7/17. Recommended TEE. -Patient referred to cardiology clinic for consideration of TEE as outpatient.  Low back pain. Back pain has been present only for the last few days. Patient denied any falls or injuries. Due to his bacteremia there was concern for discitis, so patient underwent MRI of his lumbar spine which did not reveal any evidence for the same. However, it does revealed disc disease and foraminal stenosis. Patient's movement has been limited due to pain. However, he does not have any urinary incontinence or other complaints. We will discuss with neurosurgery as to further management. Pain medications to be continued. Discussed with Dr. Kathyrn Sheriff with neurosurgery. He reviewed the MRI as well. No clear indication for any surgical intervention. For now, we will do PT and OT, muscle relaxants, pain medications.  Patient wanted epidural injection to be considered, do not recommend this while he is bacteremic. Given prescription for Percocet, follow-up with PCP for further evaluation.  Symptomatic anemia Has chronic anemia secondary to ESRD. Came in with hemoglobin of 6.7.  Status post transfusion of total 4 units of packed RBCs, all transfused during dialysis.  Fever Likely due to bacteremia   Mild acute bronchitis Symptoms appear to be improving. Continue Levaquin.   ESRD Nephrology is following. Continue to monitor closely. Patient is usually on home dialysis. If he needs long-term IV antibiotics, he may need to go to a dialysis center to get IV antibiotics. Nephrology to address this further depending  on results of blood culture.  HIV CD4 count is 360 in September  2017. Continue ARVs.  Essential hypertension  Continue home medications.  Multiple Pulmonary nodules This is an incidental finding seen on CT scan. Per radiology, repeat CT chest in 3-6 months.   Discharge Instructions  Discharge Instructions    Diet - low sodium heart healthy    Complete by:  As directed    Increase activity slowly    Complete by:  As directed        Medication List    TAKE these medications   albuterol (2.5 MG/3ML) 0.083% nebulizer solution Commonly known as:  PROVENTIL Take 2.5 mg by nebulization every 6 (six) hours as needed for wheezing or shortness of breath.   amLODipine 10 MG tablet Commonly known as:  NORVASC Take 5 mg by mouth 2 (two) times daily.   calcium acetate 667 MG capsule Commonly known as:  PHOSLO Take 1,334-2,001 mg by mouth 3 (three) times daily with meals. 2 capsules with Lunch and 3 capsules with Dinner   carvedilol 25 MG tablet Commonly known as:  COREG Take 25 mg by mouth 2 (two) times daily with a meal.   ceFAZolin 2-4 GM/100ML-% IVPB Commonly known as:  ANCEF Inject 100 mLs (2 g total) into the vein every Tuesday, Thursday, and Saturday at 6 PM.   cloNIDine 0.1 MG tablet Commonly known as:  CATAPRES Take 0.1 mg by mouth as needed (as needed related to hypertension while on hemodialysis).   COMBIVENT RESPIMAT 20-100 MCG/ACT Aers respimat Generic drug:  Ipratropium-Albuterol INHALE 1 PUFF BY MOUTH EVERY 4 HOURS AS NEEDED FOR WHEEZING OR SHORTNESS OF BREATH   EPIVIR HBV 5 MG/ML solution Generic drug:  lamivudine TAKE 5 ML BY MOUTH ONCE DAILY   ethyl chloride spray Apply 1 application topically daily as needed. At HD   hydrALAZINE 100 MG tablet Commonly known as:  APRESOLINE Take 100 mg by mouth 3 (three) times daily.   ISENTRESS 400 MG tablet Generic drug:  raltegravir TAKE 1 TABLET(400 MG) BY MOUTH TWICE DAILY   multivitamin Tabs tablet Take 1 tablet by mouth daily.   oxyCODONE-acetaminophen 5-325 MG  tablet Commonly known as:  PERCOCET/ROXICET Take 1 tablet by mouth every 6 (six) hours as needed for moderate pain or severe pain.   tenofovir 300 MG tablet Commonly known as:  VIREAD Take 1 tablet (300 mg total) by mouth every Saturday. After dialysis on Saturday   traZODone 50 MG tablet Commonly known as:  DESYREL Take 50 mg by mouth at bedtime as needed for sleep.            Durable Medical Equipment        Start     Ordered   08/07/16 1610  For home use only DME Shower stool  Once     08/07/16 1609   08/07/16 1516  For home use only DME Walker rolling  Once    Question:  Patient needs a walker to treat with the following condition  Answer:  Chronic kidney disease-mineral and bone disorder   08/07/16 1516     Follow-up Information    [facility].   Why:  Home Health RN, Physical Therapy, Occupational Therapy  Contact information: [place] [id number] [phone]          Allergies  Allergen Reactions  . Lisinopril Shortness Of Breath and Swelling    Throat swelling  . Penicillins Other (See Comments)  Childhood allergy Has patient had a PCN reaction causing immediate rash, facial/tongue/throat swelling, SOB or lightheadedness with hypotension: Unknown Has patient had a PCN reaction causing severe rash involving mucus membranes or skin necrosis: Unknown Has patient had a PCN reaction that required hospitalization: Unknown Has patient had a PCN reaction occurring within the last 10 years: Unknown If all of the above answers are "NO", then may proceed with Cephalosporin use.     Consultations:  Treatment Team:   [name], MD  Procedures (Echo, Carotid, EGD, Colonoscopy, ERCP)  Echo done on 11 08/08/2016 Impressions:  - LVEF 60-65%, moderate LVH, normal wall motion, grade 2 DD,   elevated LV filling pressure, aortic valve sclerosis with   moderate AI (vegetation cannot be excluded), mild MR  with   thickened mitral leaflets, severe LAE, mild RAE, dilated RV with   normal function, dilated IVC, no pericardial effusion. Compared   to a prior study in 03/2016, the degree of AI is worse -   vegetation cannot be excluded. Consider TEE if suspicion for   endocarditis.  Radiological studies: Dg Chest 2 View  Result Date: 08/03/2016 CLINICAL DATA:  Subacute onset of cough and mid chest pain. Initial encounter. EXAM: CHEST  2 VIEW COMPARISON:  Chest radiograph performed 07/29/2016 FINDINGS: The lungs are well-aerated. Vascular congestion is noted. Increased interstitial markings raise concern for mild pulmonary edema. There is no evidence of pleural effusion or pneumothorax. The heart is mildly enlarged. No acute osseous abnormalities are seen. IMPRESSION: Vascular congestion and mild cardiomegaly. Increased interstitial markings raise concern for mild pulmonary edema. Electronically Signed   By: Garald Balding M.D.   On: 08/03/2016 18:56   Dg Chest 2 View  Result Date: 07/29/2016 CLINICAL DATA:  Chest pain for 1 week, history of hypertension EXAM: CHEST  2 VIEW COMPARISON:  04/08/2016 FINDINGS: Cardiac shadow is enlarged but stable. Stable interstitial changes are again noted bilaterally. Some chronic markings in the bases bilaterally greater on the right are seen but stable from previous exams. No new focal infiltrate is seen. No bony abnormality is noted. IMPRESSION: Chronic bibasilar changes likely related to scarring and fibrosis. No definitive acute infiltrate is seen. Electronically Signed   By: Inez Catalina M.D.   On: 07/29/2016 09:57   Mr Lumbar Spine Wo Contrast  Result Date: 08/05/2016 CLINICAL DATA:  End-stage renal disease use, HIV. Severe low back pain. EXAM: MRI LUMBAR SPINE WITHOUT CONTRAST TECHNIQUE: Multiplanar, multisequence MR imaging of the lumbar spine was performed. No intravenous contrast was administered. COMPARISON:  None. FINDINGS: Segmentation: Normal. The lowest  disc space is considered to be L5-S1. Alignment:  Grade 1 anterolisthesis at L5-S1 Vertebrae: There is diffusely heterogeneous marrow signal throughout the visualized vertebral column. There is no evidence of discitis osteomyelitis. No epidural collection. Conus medullaris: Extends to the L1 level and appears normal. Paraspinal and other soft tissues: The kidneys are atrophic with multiple renal cyst. The other visualized retroperitoneal structures are unremarkable. Disc levels: T12-L1: Evaluated on sagittal images only. No significant disc herniation, spinal canal stenosis or neuroforaminal narrowing. L1-L2: Normal disc space and facet joints. No spinal canal stenosis. No neuroforaminal stenosis. L2-L3: Normal disc space and facet joints. No spinal canal stenosis. No neuroforaminal stenosis. L3-L4: Normal disc space and facet joints. No spinal canal stenosis. No neuroforaminal stenosis. L4-L5: Narrowing of the intervertebral disc space with minimal bulge. Moderate bilateral facet hypertrophy. No spinal canal stenosis. Moderate right and severe left neuroforaminal stenosis. L5-S1: Disc bulge with superimposed central disc  protrusion. No spinal canal stenosis. Severe right and moderate left neuroforaminal stenosis. IMPRESSION: 1. Moderately severe bilateral neural foraminal stenosis at L4-L5 and L5-S1. 2. Diffusely heterogeneous marrow signal is likely secondary to chronic anemia and/or dialysis. Marrow replacement disorders may cause a similar appearance. Electronically Signed   By: Ulyses Jarred M.D.   On: 08/05/2016 17:49   Ct Renal Stone Study  Result Date: 08/03/2016 CLINICAL DATA:  Cough for 3 weeks, low back pain. History of HIV, hepatitis, end-stage renal disease on dialysis, hypertension. EXAM: CT ABDOMEN AND PELVIS WITHOUT CONTRAST TECHNIQUE: Multidetector CT imaging of the abdomen and pelvis was performed following the standard protocol without IV contrast. COMPARISON:  None. FINDINGS: LOWER CHEST:  Heterogeneous lung attenuation in the bases compatible with small airway disease. Two 7 mm RIGHT lower lobe pulmonary nodules (series 4, image 30/57 and series 4, image 25/57). Adjacent 3 mm pulmonary nodule. 8 mm LEFT lower lobe pulmonary nodule (series 4, image 18/57). Heart size is mildly enlarged. No pericardial effusions. HEPATOBILIARY: Liver is normal by noncontrast CT appearance. Sub cm gallstone at the neck without CT findings of acute cholecystitis. PANCREAS: Normal. SPLEEN: Normal. ADRENALS/URINARY TRACT: Kidneys are orthotopic, and symmetrically atrophic. No nephrolithiasis, hydronephrosis; limited assessment for renal masses on this nonenhanced examination. The unopacified ureters are normal in course and caliber. Urinary bladder is decompressed and unremarkable. Normal adrenal glands. STOMACH/BOWEL: The stomach, small and large bowel are normal in course and caliber without inflammatory changes, the sensitivity may be decreased by lack of enteric contrast. Mild amount of retained large bowel stool. Normal appendix. VASCULAR/LYMPHATIC: Aorta is normal course and caliber, severe calcific atherosclerosis. Ectatic Common iliac arteries with severe calcific atherosclerosis. No lymphadenopathy by CT size criteria. REPRODUCTIVE: Normal. OTHER: No intraperitoneal free fluid or free air. MUSCULOSKELETAL: Non-acute. Diffusely sclerotic axial skeleton compatible with renal osteodystrophy. Moderate RIGHT, severe LEFT hip osteoarthrosis. Severe L4-5 and L5-S1 degenerative discs without soft tissue component to suggest infection. Severe LEFT L4-5, severe RIGHT L5-S1 neural foraminal narrowing. No acute osseous process. IMPRESSION: Symmetrically atrophic kidneys without urolithiasis or obstructive uropathy. No acute intra-abdominal or pelvic process. Severe degenerative change of lower lumbar spine resulting in severe L4-5 and L5-S1 neural foraminal narrowing. Multiple lung base pulmonary nodules measure up to 8 mm.  Recommend follow-up CT chest at 3-6 months. Heterogeneous lung attenuation compatible with small airway disease. Severe atherosclerosis. Electronically Signed   By: Elon Alas M.D.   On: 08/03/2016 21:08     Subjective:  Discharge Exam: Vitals:   08/08/16 0830 08/08/16 0900 08/08/16 0930 08/08/16 1000  BP: (!) 204/95 (!) 198/91 (!) 186/76 (!) 182/80  Pulse: 71 68 66 67  Resp:    18  Temp:    98.2 F (36.8 C)  TempSrc:    Oral  SpO2:    100%  Weight:    84.9 kg (187 lb 2.7 oz)  Height:       General: Pt is alert, awake, not in acute distress Cardiovascular: RRR, S1/S2 +, no rubs, no gallops Respiratory: CTA bilaterally, no wheezing, no rhonchi Abdominal: Soft, NT, ND, bowel sounds + Extremities: no edema, no cyanosis   The results of significant diagnostics from this hospitalization (including imaging, microbiology, ancillary and laboratory) are listed below for reference.    Microbiology: Recent Results (from the past 240 hour(s))  Culture, blood (routine x 2)     Status: Abnormal   Collection Time: 08/04/16 12:25 AM  Result Value Ref Range Status   Specimen Description BLOOD RIGHT ANTECUBITAL  Final   Special Requests BOTTLES DRAWN AEROBIC AND ANAEROBIC 5ML  Final   Culture  Setup Time   Final    GRAM POSITIVE COCCI IN CLUSTERS IN BOTH AEROBIC AND ANAEROBIC BOTTLES CRITICAL VALUE NOTED.  VALUE IS CONSISTENT WITH PREVIOUSLY REPORTED AND CALLED VALUE.    Culture (A)  Final    STAPHYLOCOCCUS SPECIES (COAGULASE NEGATIVE) SUSCEPTIBILITIES PERFORMED ON PREVIOUS CULTURE WITHIN THE LAST 5 DAYS. Performed at [facility]    Report Status 08/07/2016 FINAL  Final  Culture, blood (routine x 2)     Status: Abnormal   Collection Time: 08/04/16 12:36 AM  Result Value Ref Range Status   Specimen Description BLOOD RIGHT FOREARM  Final   Special Requests BOTTLES DRAWN AEROBIC AND ANAEROBIC 5ML  Final   Culture  Setup Time   Final    GRAM POSITIVE COCCI IN CLUSTERS IN  BOTH AEROBIC AND ANAEROBIC BOTTLES CRITICAL RESULT CALLED TO, READ BACK BY AND VERIFIED WITHArnold Long RN AT 0722 08/05/16 BY D. VANHOOK Performed at [facility] (COAGULASE NEGATIVE) (A)  Final   Report Status 08/07/2016 FINAL  Final   Organism ID, Bacteria STAPHYLOCOCCUS SPECIES (COAGULASE NEGATIVE)  Final      Susceptibility   Staphylococcus species (coagulase negative) - MIC*    CIPROFLOXACIN <=0.5 SENSITIVE Sensitive     ERYTHROMYCIN <=0.25 SENSITIVE Sensitive     GENTAMICIN <=0.5 SENSITIVE Sensitive     OXACILLIN <=0.25 SENSITIVE Sensitive     TETRACYCLINE 2 SENSITIVE Sensitive     VANCOMYCIN 2 SENSITIVE Sensitive     TRIMETH/SULFA <=10 SENSITIVE Sensitive     CLINDAMYCIN <=0.25 SENSITIVE Sensitive     RIFAMPIN <=0.5 SENSITIVE Sensitive     Inducible Clindamycin NEGATIVE Sensitive     * STAPHYLOCOCCUS SPECIES (COAGULASE NEGATIVE)  Blood Culture ID Panel (Reflexed)     Status: Abnormal   Collection Time: 08/04/16 12:36 AM  Result Value Ref Range Status   Enterococcus species NOT DETECTED NOT DETECTED Final   Listeria monocytogenes NOT DETECTED NOT DETECTED Final   Staphylococcus species DETECTED (A) NOT DETECTED Final    Comment: CRITICAL RESULT CALLED TO, READ BACK BY AND VERIFIED WITH: IRichardson Dopp RN AT 2993 08/05/16 BY D. VANHOOK    Staphylococcus aureus NOT DETECTED NOT DETECTED Final   Methicillin resistance NOT DETECTED NOT DETECTED Final   Streptococcus species NOT DETECTED NOT DETECTED Final   Streptococcus agalactiae NOT DETECTED NOT DETECTED Final   Streptococcus pneumoniae NOT DETECTED NOT DETECTED Final   Streptococcus pyogenes NOT DETECTED NOT DETECTED Final   Acinetobacter baumannii NOT DETECTED NOT DETECTED Final   Enterobacteriaceae species NOT DETECTED NOT DETECTED Final   Enterobacter cloacae complex NOT DETECTED NOT DETECTED Final   Escherichia coli NOT DETECTED NOT DETECTED Final   Klebsiella oxytoca NOT DETECTED  NOT DETECTED Final   Klebsiella pneumoniae NOT DETECTED NOT DETECTED Final   Proteus species NOT DETECTED NOT DETECTED Final   Serratia marcescens NOT DETECTED NOT DETECTED Final   Haemophilus influenzae NOT DETECTED NOT DETECTED Final   Neisseria meningitidis NOT DETECTED NOT DETECTED Final   Pseudomonas aeruginosa NOT DETECTED NOT DETECTED Final   Candida albicans NOT DETECTED NOT DETECTED Final   Candida glabrata NOT DETECTED NOT DETECTED Final   Candida krusei NOT DETECTED NOT DETECTED Final   Candida parapsilosis NOT DETECTED NOT DETECTED Final   Candida tropicalis NOT DETECTED NOT DETECTED Final    Comment: Performed at [place]  PCR Screening     Status: None   Collection Time: 08/06/16  6:23 AM  Result Value Ref Range Status   MRSA by PCR NEGATIVE NEGATIVE Final    Comment:        The GeneXpert MRSA Assay (FDA approved for NASAL specimens only), is one component of a comprehensive MRSA colonization surveillance program. It is not intended to diagnose MRSA infection nor to guide or monitor treatment for MRSA infections.   Culture, blood (Routine X 2) w Reflex to ID Panel     Status: None (Preliminary result)   Collection Time: 08/07/16  4:15 AM  Result Value Ref Range Status   Specimen Description BLOOD RIGHT HAND  Final   Special Requests BOTTLES DRAWN AEROBIC AND ANAEROBIC 10ML  Final   Culture NO GROWTH 1 DAY  Final   Report Status PENDING  Incomplete  Culture, blood (Routine X 2) w Reflex to ID Panel     Status: None (Preliminary result)   Collection Time: 08/07/16  4:22 AM  Result Value Ref Range Status   Specimen Description BLOOD RIGHT FOREARM  Final   Special Requests BOTTLES DRAWN AEROBIC AND ANAEROBIC 10ML  Final   Culture NO GROWTH 1 DAY  Final   Report Status PENDING  Incomplete     Labs: BNP (last 3 results) No results for input(s): BNP in the last 8760 hours. Basic Metabolic Panel:  Recent Labs Lab 08/03/16 2111 08/04/16 1425  08/06/16 0723 08/06/16 0726 08/08/16 0750  NA 134* 134*  --  133* 131*  K 4.6 4.7  --  3.7 4.0  CL 97* 98*  --  95* 92*  CO2 23 23  --  26 26  GLUCOSE 92 93  --  109* 105*  BUN 81* 83*  --  57* 58*  CREATININE 13.65* 15.56*  --  10.37* 8.95*  CALCIUM 9.5 9.4  --  9.1 8.7*  MG  --   --  1.9  --   --   PHOS  --  4.5  --  5.0* 4.5   Liver Function Tests:  Recent Labs Lab 08/04/16 1425 08/06/16 0726 08/08/16 0750  ALBUMIN 3.2* 3.1* 3.3*   No results for input(s): LIPASE, AMYLASE in the last 168 hours. No results for input(s): AMMONIA in the last 168 hours. CBC:  Recent Labs Lab 08/03/16 2111 08/04/16 1425 08/05/16 0901 08/06/16 0726 08/08/16 0750  WBC 14.3* 15.0* 10.3 11.2* 11.7*  NEUTROABS 12.1*  --   --   --   --   HGB 6.7* 5.5* 7.5* 7.0* 8.5*  HCT 18.9* 16.8* 20.9* 19.8* 24.2*  MCV 95.9 88.0 94.1 92.5 90.3  PLT 162 142* 132* 152 216   Cardiac Enzymes: No results for input(s): CKTOTAL, CKMB, CKMBINDEX, TROPONINI in the last 168 hours. BNP: Invalid input(s): POCBNP CBG:  Recent Labs Lab 08/05/16 1136  GLUCAP 109*   D-Dimer No results for input(s): DDIMER in the last 72 hours. Hgb A1c No results for input(s): HGBA1C in the last 72 hours. Lipid Profile No results for input(s): CHOL, HDL, LDLCALC, TRIG, CHOLHDL, LDLDIRECT in the last 72 hours. Thyroid function studies No results for input(s): TSH, T4TOTAL, T3FREE, THYROIDAB in the last 72 hours.  Invalid input(s): FREET3 Anemia work up No results for input(s): VITAMINB12, FOLATE, FERRITIN, TIBC, IRON, RETICCTPCT in the last 72 hours. Urinalysis    Component Value Date/Time   COLORURINE AMBER (A) 06/14/2011 1513   APPEARANCEUR CLOUDY (A) 06/14/2011 1513   LABSPEC 1.020 06/14/2011 1513  PHURINE 6.0 06/14/2011 1513   GLUCOSEU NEGATIVE 06/14/2011 1513   HGBUR LARGE (A) 06/14/2011 1513   BILIRUBINUR SMALL (A) 06/14/2011 1513   KETONESUR NEGATIVE 06/14/2011 1513   PROTEINUR >300 (A) 06/14/2011 1513    UROBILINOGEN 0.2 06/14/2011 1513   NITRITE NEGATIVE 06/14/2011 1513   LEUKOCYTESUR SMALL (A) 06/14/2011 1513   Sepsis Labs Invalid input(s): PROCALCITONIN,  WBC,  LACTICIDVEN Microbiology Recent Results (from the past 240 hour(s))  Culture, blood (routine x 2)     Status: Abnormal   Collection Time: 08/04/16 12:25 AM  Result Value Ref Range Status   Specimen Description BLOOD RIGHT ANTECUBITAL  Final   Special Requests BOTTLES DRAWN AEROBIC AND ANAEROBIC 5ML  Final   Culture  Setup Time   Final    GRAM POSITIVE COCCI IN CLUSTERS IN BOTH AEROBIC AND ANAEROBIC BOTTLES CRITICAL VALUE NOTED.  VALUE IS CONSISTENT WITH PREVIOUSLY REPORTED AND CALLED VALUE.    Culture (A)  Final    STAPHYLOCOCCUS SPECIES (COAGULASE NEGATIVE) SUSCEPTIBILITIES PERFORMED ON PREVIOUS CULTURE WITHIN THE LAST 5 DAYS. Performed at [facility]    Report Status 08/07/2016 FINAL  Final  Culture, blood (routine x 2)     Status: Abnormal   Collection Time: 08/04/16 12:36 AM  Result Value Ref Range Status   Specimen Description BLOOD RIGHT FOREARM  Final   Special Requests BOTTLES DRAWN AEROBIC AND ANAEROBIC 5ML  Final   Culture  Setup Time   Final    GRAM POSITIVE COCCI IN CLUSTERS IN BOTH AEROBIC AND ANAEROBIC BOTTLES CRITICAL RESULT CALLED TO, READ BACK BY AND VERIFIED WITHArnold Long RN AT 0722 08/05/16 BY D. VANHOOK Performed at [facility]    Culture STAPHYLOCOCCUS SPECIES (COAGULASE NEGATIVE) (A)  Final   Report Status 08/07/2016 FINAL  Final   Organism ID, Bacteria STAPHYLOCOCCUS SPECIES (COAGULASE NEGATIVE)  Final      Susceptibility   Staphylococcus species (coagulase negative) - MIC*    CIPROFLOXACIN <=0.5 SENSITIVE Sensitive     ERYTHROMYCIN <=0.25 SENSITIVE Sensitive     GENTAMICIN <=0.5 SENSITIVE Sensitive     OXACILLIN <=0.25 SENSITIVE Sensitive     TETRACYCLINE 2 SENSITIVE Sensitive     VANCOMYCIN 2 SENSITIVE Sensitive     TRIMETH/SULFA <=10 SENSITIVE Sensitive      CLINDAMYCIN <=0.25 SENSITIVE Sensitive     RIFAMPIN <=0.5 SENSITIVE Sensitive     Inducible Clindamycin NEGATIVE Sensitive     * STAPHYLOCOCCUS SPECIES (COAGULASE NEGATIVE)  Blood Culture ID Panel (Reflexed)     Status: Abnormal   Collection Time: 08/04/16 12:36 AM  Result Value Ref Range Status   Enterococcus species NOT DETECTED NOT DETECTED Final   Listeria monocytogenes NOT DETECTED NOT DETECTED Final   Staphylococcus species DETECTED (A) NOT DETECTED Final    Comment: CRITICAL RESULT CALLED TO, READ BACK BY AND VERIFIED WITH: IRichardson Dopp RN AT 0254 08/05/16 BY D. VANHOOK    Staphylococcus aureus NOT DETECTED NOT DETECTED Final   Methicillin resistance NOT DETECTED NOT DETECTED Final   Streptococcus species NOT DETECTED NOT DETECTED Final   Streptococcus agalactiae NOT DETECTED NOT DETECTED Final   Streptococcus pneumoniae NOT DETECTED NOT DETECTED Final   Streptococcus pyogenes NOT DETECTED NOT DETECTED Final   Acinetobacter baumannii NOT DETECTED NOT DETECTED Final   Enterobacteriaceae species NOT DETECTED NOT DETECTED Final   Enterobacter cloacae complex NOT DETECTED NOT DETECTED Final   Escherichia coli NOT DETECTED NOT DETECTED Final   Klebsiella oxytoca NOT DETECTED NOT DETECTED Final  Klebsiella pneumoniae NOT DETECTED NOT DETECTED Final   Proteus species NOT DETECTED NOT DETECTED Final   Serratia marcescens NOT DETECTED NOT DETECTED Final   Haemophilus influenzae NOT DETECTED NOT DETECTED Final   Neisseria meningitidis NOT DETECTED NOT DETECTED Final   Pseudomonas aeruginosa NOT DETECTED NOT DETECTED Final   Candida albicans NOT DETECTED NOT DETECTED Final   Candida glabrata NOT DETECTED NOT DETECTED Final   Candida krusei NOT DETECTED NOT DETECTED Final   Candida parapsilosis NOT DETECTED NOT DETECTED Final   Candida tropicalis NOT DETECTED NOT DETECTED Final    Comment: Performed at [facility]  MRSA PCR Screening     Status: None   Collection Time: 08/06/16   6:23 AM  Result Value Ref Range Status   MRSA by PCR NEGATIVE NEGATIVE Final    Comment:        The GeneXpert MRSA Assay (FDA approved for NASAL specimens only), is one component of a comprehensive MRSA colonization surveillance program. It is not intended to diagnose MRSA infection nor to guide or monitor treatment for MRSA infections.   Culture, blood (Routine X 2) w Reflex to ID Panel     Status: None (Preliminary result)   Collection Time: 08/07/16  4:15 AM  Result Value Ref Range Status   Specimen Description BLOOD RIGHT HAND  Final   Special Requests BOTTLES DRAWN AEROBIC AND ANAEROBIC 10ML  Final   Culture NO GROWTH 1 DAY  Final   Report Status PENDING  Incomplete  Culture, blood (Routine X 2) w Reflex to ID Panel     Status: None (Preliminary result)   Collection Time: 08/07/16  4:22 AM  Result Value Ref Range Status   Specimen Description BLOOD RIGHT FOREARM  Final   Special Requests BOTTLES DRAWN AEROBIC AND ANAEROBIC 10ML  Final   Culture NO GROWTH 1 DAY  Final   Report Status PENDING  Incomplete     Time coordinating discharge: Over 30 minutes  SIGNED:   Birdie Hopes, MD  [facility] 08/08/2016, 10:38 AM Pager   If 7PM-7AM, please contact night-coverage [email or web address] Password [email or web address]

## 2016-08-08 NOTE — Procedures (Addendum)
Plan is for dc home today, to get IV abx (Ancef) for 2 wks course; he has been instructed in ABx administration already , Abx will be delivered to his house Monday, he is to do TTS abx admin w HD until gone.   BP's are up , he is on all his pre-admit BP meds and 1kg under dry wt. OK for dc on this same home regimen, will lower edw to 85kg and challenge in OP setting.     I was present at this dialysis session, have reviewed the session itself and made  appropriate changes Kelly Splinter MD [address] pager [phone]   08/08/2016, 10:19 AM

## 2016-08-08 NOTE — Progress Notes (Signed)
Discharge instructions and medications discussed with patient.  Prescription given to patient.  All questions answered.  

## 2016-08-10 LAB — T-HELPER CELLS (CD4) COUNT (NOT AT ARMC)
CD4 T CELL HELPER: 22 % — AB (ref 33–55)
CD4 T Cell Abs: 160 /uL — ABNORMAL LOW (ref 400–2700)

## 2016-08-12 DIAGNOSIS — E8779 Other fluid overload: Secondary | ICD-10-CM | POA: Diagnosis not present

## 2016-08-12 DIAGNOSIS — D509 Iron deficiency anemia, unspecified: Secondary | ICD-10-CM | POA: Diagnosis not present

## 2016-08-12 DIAGNOSIS — Z79899 Other long term (current) drug therapy: Secondary | ICD-10-CM | POA: Diagnosis not present

## 2016-08-12 DIAGNOSIS — D631 Anemia in chronic kidney disease: Secondary | ICD-10-CM | POA: Diagnosis not present

## 2016-08-12 DIAGNOSIS — N186 End stage renal disease: Secondary | ICD-10-CM | POA: Diagnosis not present

## 2016-08-12 LAB — CULTURE, BLOOD (ROUTINE X 2)
Culture: NO GROWTH
Culture: NO GROWTH

## 2016-08-14 DIAGNOSIS — D509 Iron deficiency anemia, unspecified: Secondary | ICD-10-CM | POA: Diagnosis not present

## 2016-08-14 DIAGNOSIS — E8779 Other fluid overload: Secondary | ICD-10-CM | POA: Diagnosis not present

## 2016-08-14 DIAGNOSIS — Z79899 Other long term (current) drug therapy: Secondary | ICD-10-CM | POA: Diagnosis not present

## 2016-08-14 DIAGNOSIS — N186 End stage renal disease: Secondary | ICD-10-CM | POA: Diagnosis not present

## 2016-08-14 DIAGNOSIS — D631 Anemia in chronic kidney disease: Secondary | ICD-10-CM | POA: Diagnosis not present

## 2016-08-15 DIAGNOSIS — D631 Anemia in chronic kidney disease: Secondary | ICD-10-CM | POA: Diagnosis not present

## 2016-08-15 DIAGNOSIS — E8779 Other fluid overload: Secondary | ICD-10-CM | POA: Diagnosis not present

## 2016-08-15 DIAGNOSIS — Z79899 Other long term (current) drug therapy: Secondary | ICD-10-CM | POA: Diagnosis not present

## 2016-08-15 DIAGNOSIS — N186 End stage renal disease: Secondary | ICD-10-CM | POA: Diagnosis not present

## 2016-08-15 DIAGNOSIS — D509 Iron deficiency anemia, unspecified: Secondary | ICD-10-CM | POA: Diagnosis not present

## 2016-08-17 ENCOUNTER — Telehealth: Payer: Self-pay | Admitting: *Deleted

## 2016-08-17 DIAGNOSIS — N186 End stage renal disease: Secondary | ICD-10-CM | POA: Diagnosis not present

## 2016-08-17 DIAGNOSIS — Z79899 Other long term (current) drug therapy: Secondary | ICD-10-CM | POA: Diagnosis not present

## 2016-08-17 DIAGNOSIS — D631 Anemia in chronic kidney disease: Secondary | ICD-10-CM | POA: Diagnosis not present

## 2016-08-17 DIAGNOSIS — D509 Iron deficiency anemia, unspecified: Secondary | ICD-10-CM | POA: Diagnosis not present

## 2016-08-17 DIAGNOSIS — E8779 Other fluid overload: Secondary | ICD-10-CM | POA: Diagnosis not present

## 2016-08-17 NOTE — Telephone Encounter (Signed)
Recently Hospialized, requesting follow-up appt.  Patient could not accurately state why or what he was in the hospital.  Complaining of severe back pain today.  Able to place him in an appointment with Dr. Johnnye Sima for tomorrow at 2:45 PM.  Patient stated that he would find transportation for the appointment.

## 2016-08-18 ENCOUNTER — Ambulatory Visit: Payer: Medicare Other | Admitting: Infectious Diseases

## 2016-08-18 DIAGNOSIS — M545 Low back pain: Secondary | ICD-10-CM | POA: Diagnosis not present

## 2016-08-19 ENCOUNTER — Other Ambulatory Visit: Payer: Self-pay | Admitting: Sports Medicine

## 2016-08-19 DIAGNOSIS — D631 Anemia in chronic kidney disease: Secondary | ICD-10-CM | POA: Diagnosis not present

## 2016-08-19 DIAGNOSIS — Z79899 Other long term (current) drug therapy: Secondary | ICD-10-CM | POA: Diagnosis not present

## 2016-08-19 DIAGNOSIS — N186 End stage renal disease: Secondary | ICD-10-CM | POA: Diagnosis not present

## 2016-08-19 DIAGNOSIS — E8779 Other fluid overload: Secondary | ICD-10-CM | POA: Diagnosis not present

## 2016-08-19 DIAGNOSIS — D509 Iron deficiency anemia, unspecified: Secondary | ICD-10-CM | POA: Diagnosis not present

## 2016-08-19 DIAGNOSIS — M545 Low back pain: Secondary | ICD-10-CM

## 2016-08-20 ENCOUNTER — Telehealth: Payer: Self-pay | Admitting: *Deleted

## 2016-08-20 NOTE — Telephone Encounter (Signed)
Patient called and advised he missed his appt 08/18/16 due to another appt about his back pain. He was rescheduled for appt in January to see the doctor. He asked if Dr Johnnye Sima could prescribe him a pain medication. Advised him could ask the doctor but since he is already being seen by Dr Alfonso Ramus for this and is scheduled for an injection in his back he will likely defer to that doctor to handle since his does not know what his plan is. Advised him to call that office for pain medication if possible. The patient advised understands and will ask Dr Alfonso Ramus office instead.

## 2016-08-21 DIAGNOSIS — D509 Iron deficiency anemia, unspecified: Secondary | ICD-10-CM | POA: Diagnosis not present

## 2016-08-21 DIAGNOSIS — E8779 Other fluid overload: Secondary | ICD-10-CM | POA: Diagnosis not present

## 2016-08-21 DIAGNOSIS — N186 End stage renal disease: Secondary | ICD-10-CM | POA: Diagnosis not present

## 2016-08-21 DIAGNOSIS — Z79899 Other long term (current) drug therapy: Secondary | ICD-10-CM | POA: Diagnosis not present

## 2016-08-21 DIAGNOSIS — R7881 Bacteremia: Secondary | ICD-10-CM | POA: Diagnosis not present

## 2016-08-21 DIAGNOSIS — D631 Anemia in chronic kidney disease: Secondary | ICD-10-CM | POA: Diagnosis not present

## 2016-08-23 DIAGNOSIS — D631 Anemia in chronic kidney disease: Secondary | ICD-10-CM | POA: Diagnosis not present

## 2016-08-23 DIAGNOSIS — D509 Iron deficiency anemia, unspecified: Secondary | ICD-10-CM | POA: Diagnosis not present

## 2016-08-23 DIAGNOSIS — N186 End stage renal disease: Secondary | ICD-10-CM | POA: Diagnosis not present

## 2016-08-23 DIAGNOSIS — R7881 Bacteremia: Secondary | ICD-10-CM | POA: Diagnosis not present

## 2016-08-23 DIAGNOSIS — E8779 Other fluid overload: Secondary | ICD-10-CM | POA: Diagnosis not present

## 2016-08-23 DIAGNOSIS — Z79899 Other long term (current) drug therapy: Secondary | ICD-10-CM | POA: Diagnosis not present

## 2016-08-25 ENCOUNTER — Ambulatory Visit
Admission: RE | Admit: 2016-08-25 | Discharge: 2016-08-25 | Disposition: A | Discharge status: Home / Self Care | Payer: Medicare Other | Source: Ambulatory Visit | Attending: Sports Medicine | Admitting: Sports Medicine

## 2016-08-25 DIAGNOSIS — M545 Low back pain: Secondary | ICD-10-CM | POA: Diagnosis not present

## 2016-08-25 MED ORDER — METHYLPREDNISOLONE ACETATE 40 MG/ML INJ SUSP (RADIOLOG
120.0000 mg | Freq: Once | INTRAMUSCULAR | Status: AC
  Administered 2016-08-25: 120 mg via EPIDURAL

## 2016-08-25 MED ORDER — IOPAMIDOL (ISOVUE-M 200) INJECTION 41%
1.0000 mL | Freq: Once | INTRAMUSCULAR | Status: AC
  Administered 2016-08-25: 1 mL via EPIDURAL

## 2016-08-25 NOTE — Discharge Instructions (Signed)
Post Procedure Spinal Discharge Instruction Sheet  1. You may resume a regular diet and any medications that you routinely take (including pain medications).  2. No driving day of procedure.  3. Light activity throughout the rest of the day.  Do not do any strenuous work, exercise, bending or lifting.  The day following the procedure, you can resume normal physical activity but you should refrain from exercising or physical therapy for at least three days thereafter.   Common Side Effects:   Headaches- take your usual medications as directed by your physician.  Increase your fluid intake.  Caffeinated beverages may be helpful.  Lie flat in bed until your headache resolves.   Restlessness or inability to sleep- you may have trouble sleeping for the next few days.  Ask your referring physician if you need any medication for sleep.   Facial flushing or redness- should subside within a few days.   Increased pain- a temporary increase in pain a day or two following your procedure is not unusual.  Take your pain medication as prescribed by your referring physician.   Leg cramps  Please contact our office at [phone] for the following symptoms:  Fever greater than 100 degrees.  Headaches unresolved with medication after 2-3 days.  Increased swelling, pain, or redness at injection site.  Thank you for visiting our office.    You may resume Heparin today.

## 2016-08-28 DIAGNOSIS — D509 Iron deficiency anemia, unspecified: Secondary | ICD-10-CM | POA: Diagnosis not present

## 2016-08-28 DIAGNOSIS — N186 End stage renal disease: Secondary | ICD-10-CM | POA: Diagnosis not present

## 2016-08-28 DIAGNOSIS — E8779 Other fluid overload: Secondary | ICD-10-CM | POA: Diagnosis not present

## 2016-08-28 DIAGNOSIS — Z79899 Other long term (current) drug therapy: Secondary | ICD-10-CM | POA: Diagnosis not present

## 2016-08-28 DIAGNOSIS — R7881 Bacteremia: Secondary | ICD-10-CM | POA: Diagnosis not present

## 2016-08-28 DIAGNOSIS — D631 Anemia in chronic kidney disease: Secondary | ICD-10-CM | POA: Diagnosis not present

## 2016-08-30 DIAGNOSIS — D509 Iron deficiency anemia, unspecified: Secondary | ICD-10-CM | POA: Diagnosis not present

## 2016-08-30 DIAGNOSIS — Z79899 Other long term (current) drug therapy: Secondary | ICD-10-CM | POA: Diagnosis not present

## 2016-08-30 DIAGNOSIS — E8779 Other fluid overload: Secondary | ICD-10-CM | POA: Diagnosis not present

## 2016-08-30 DIAGNOSIS — N186 End stage renal disease: Secondary | ICD-10-CM | POA: Diagnosis not present

## 2016-08-30 DIAGNOSIS — R7881 Bacteremia: Secondary | ICD-10-CM | POA: Diagnosis not present

## 2016-08-30 DIAGNOSIS — D631 Anemia in chronic kidney disease: Secondary | ICD-10-CM | POA: Diagnosis not present

## 2016-08-31 ENCOUNTER — Other Ambulatory Visit: Payer: Self-pay

## 2016-08-31 ENCOUNTER — Telehealth: Payer: Self-pay | Admitting: Infectious Diseases

## 2016-08-31 NOTE — Telephone Encounter (Signed)
Jeffrey [name]'s partner is calling to request refill of pain medication(oxycodone) .  He did receive pain injection on last Thursday but pain was not relieved.  I will forward message to Dr Johnnye Sima for approval and if approved in house physician can sign script.   Laverle Patter, RN

## 2016-08-31 NOTE — Telephone Encounter (Signed)
Pain is "severe."  Requesting rx by the end of business today for pickup.

## 2016-08-31 NOTE — Telephone Encounter (Signed)
Pt and partner calling regarding pts continued pain, no answeer I left a message for pt to go to urgent care if his pain is severe, or to f/u with the person who gave him the injection.  I advised him that [facility] no longer prescribes narcotic pain rx's If he has questions for me I suggested that he contact me through the hospital operator.

## 2016-08-31 NOTE — Telephone Encounter (Deleted)
Patient's wife calling for lab results.     [phone]

## 2016-09-01 ENCOUNTER — Other Ambulatory Visit: Payer: Medicare Other

## 2016-09-01 DIAGNOSIS — D509 Iron deficiency anemia, unspecified: Secondary | ICD-10-CM | POA: Diagnosis not present

## 2016-09-01 DIAGNOSIS — Z79899 Other long term (current) drug therapy: Secondary | ICD-10-CM | POA: Diagnosis not present

## 2016-09-01 DIAGNOSIS — E8779 Other fluid overload: Secondary | ICD-10-CM | POA: Diagnosis not present

## 2016-09-01 DIAGNOSIS — D631 Anemia in chronic kidney disease: Secondary | ICD-10-CM | POA: Diagnosis not present

## 2016-09-01 DIAGNOSIS — R7881 Bacteremia: Secondary | ICD-10-CM | POA: Diagnosis not present

## 2016-09-01 DIAGNOSIS — N186 End stage renal disease: Secondary | ICD-10-CM | POA: Diagnosis not present

## 2016-09-02 DIAGNOSIS — M109 Gout, unspecified: Secondary | ICD-10-CM | POA: Diagnosis not present

## 2016-09-02 DIAGNOSIS — Z79899 Other long term (current) drug therapy: Secondary | ICD-10-CM | POA: Diagnosis not present

## 2016-09-02 DIAGNOSIS — E8779 Other fluid overload: Secondary | ICD-10-CM | POA: Diagnosis not present

## 2016-09-02 DIAGNOSIS — D631 Anemia in chronic kidney disease: Secondary | ICD-10-CM | POA: Diagnosis not present

## 2016-09-02 DIAGNOSIS — R7881 Bacteremia: Secondary | ICD-10-CM | POA: Diagnosis not present

## 2016-09-02 DIAGNOSIS — N186 End stage renal disease: Secondary | ICD-10-CM | POA: Diagnosis not present

## 2016-09-02 DIAGNOSIS — D509 Iron deficiency anemia, unspecified: Secondary | ICD-10-CM | POA: Diagnosis not present

## 2016-09-07 ENCOUNTER — Encounter: Payer: Self-pay | Admitting: Pulmonary Disease

## 2016-09-07 ENCOUNTER — Ambulatory Visit (INDEPENDENT_AMBULATORY_CARE_PROVIDER_SITE_OTHER): Payer: Medicare Other | Admitting: Pulmonary Disease

## 2016-09-07 VITALS — BP 189/84 | HR 74 | Temp 97.4°F | Ht 72.0 in | Wt 192.7 lb

## 2016-09-07 DIAGNOSIS — W19XXXA Unspecified fall, initial encounter: Secondary | ICD-10-CM

## 2016-09-07 DIAGNOSIS — I10 Essential (primary) hypertension: Secondary | ICD-10-CM

## 2016-09-07 DIAGNOSIS — Y92009 Unspecified place in unspecified non-institutional (private) residence as the place of occurrence of the external cause: Secondary | ICD-10-CM

## 2016-09-07 DIAGNOSIS — R918 Other nonspecific abnormal finding of lung field: Secondary | ICD-10-CM

## 2016-09-07 DIAGNOSIS — Z87891 Personal history of nicotine dependence: Secondary | ICD-10-CM

## 2016-09-07 DIAGNOSIS — S80211A Abrasion, right knee, initial encounter: Secondary | ICD-10-CM | POA: Diagnosis not present

## 2016-09-07 DIAGNOSIS — Z79899 Other long term (current) drug therapy: Secondary | ICD-10-CM | POA: Diagnosis not present

## 2016-09-07 DIAGNOSIS — Z9181 History of falling: Secondary | ICD-10-CM | POA: Diagnosis not present

## 2016-09-07 DIAGNOSIS — D631 Anemia in chronic kidney disease: Secondary | ICD-10-CM | POA: Diagnosis not present

## 2016-09-07 DIAGNOSIS — D509 Iron deficiency anemia, unspecified: Secondary | ICD-10-CM | POA: Diagnosis not present

## 2016-09-07 DIAGNOSIS — M47817 Spondylosis without myelopathy or radiculopathy, lumbosacral region: Secondary | ICD-10-CM

## 2016-09-07 DIAGNOSIS — E8779 Other fluid overload: Secondary | ICD-10-CM | POA: Diagnosis not present

## 2016-09-07 DIAGNOSIS — N186 End stage renal disease: Secondary | ICD-10-CM | POA: Diagnosis not present

## 2016-09-07 DIAGNOSIS — Z5189 Encounter for other specified aftercare: Secondary | ICD-10-CM

## 2016-09-07 DIAGNOSIS — R7881 Bacteremia: Secondary | ICD-10-CM | POA: Diagnosis not present

## 2016-09-07 MED ORDER — TRAMADOL HCL 50 MG PO TABS: 50.0000 mg | ORAL_TABLET | Freq: Two times a day (BID) | ORAL | 0 refills | Status: DC | PRN

## 2016-09-07 NOTE — Patient Instructions (Addendum)
Please follow up in 1-2 weeks Please make an appointment to follow up with your orthopedic physician

## 2016-09-07 NOTE — Progress Notes (Signed)
   CC: lower back pain  HPI:  Jeffrey [name] is a 60 y.o. man with ESRD on home HD, HIV, HTN, COPD presenting for follow up of back pain.  He presented to the ED on 11/13 with severe low back pain. Found to have bacteremia with methicillin sensitive coag neg staph. He was prescribed Ancef for 6 weeks.   He does see some improvement with the spinal injection. He has had this pain for a month. About a week into it, he went to the ED and was admitted to the hospital. He has been seeing orthopedic clinic on [facility]. Alfonso Ramus). Presently he has pain in his waist. Wakes him from his sleep. He is able to walk a little. The pain is dull and tightening. Occasional sharp shooting pain. Radiates down into leg. Bottom of left foot with occasional numbness.   Fell twice. He fell Tuesday at home. He also fell yesterday at [facility]. He scraped his right knee. No LOC or head trauma.    Past Medical History:  Diagnosis Date  . Anemia   . Anxiety   . Arthritis   . Asthma    per pt hx  . End stage renal disease on home HD 07/10/2011   Started HD in September 2012 at [facility] with a tunneled HD catheter, now on home HD with NxtStage. Dialyzing through AVF L lower arm with buttonhole technique as of mid 2014. His brother does the HD treatments at home.  They are roommates for 23 years.  The brother works 3rd shift and gets off about 8am and then puts Jeffrey [name] on HD in the morning after getting home. Most of the time he does HD about 4 times a week, for about 4 hours per treatment. Cause of ESRD was HTN according to patient. He says he let his health go and ending up with complications, and that he didn't like seeing doctors in those days.  He says he was diagnosed with severe HTN when he lived in [place] in his 71's.   . Gout   . Hepatitis B carrier ([place])   . HIV infection ([place])   . Hypertension   . Hyperthyroidism   . Pneumonia   . Seizure ([place])   . Seizures ([place]) 06/02/2011   pt denies this  .  Thrombocytopenia ([place])     Review of Systems:   No fevers or chills No nausea/vomiting  Physical Exam:  Vitals:   09/07/16 1114  BP: (!) 189/82  Pulse: 72  Temp: 97.4 F (36.3 C)  TempSrc: Oral  SpO2: 98%  Weight: 192 lb 11.2 oz (87.4 kg)  Height: 6' (1.829 m)   General Apperance: NAD HEENT: Normocephalic, atraumatic, anicteric sclera Neck: Supple, trachea midline Lungs: Clear to auscultation bilaterally. No wheezes, rhonchi or rales. Breathing comfortably Heart: Regular rate and rhythm, no murmur/rub/gallop Abdomen: Soft, nontender, nondistended, no rebound/guarding Extremities: Warm and well perfused, no edema Skin: R knee abrasion about 4cm Neurologic: Alert and interactive. No gross deficits.   Assessment & Plan:   See Encounters Tab for problem based charting.  Patient discussed with Dr. Dareen Piano

## 2016-09-08 DIAGNOSIS — S80211A Abrasion, right knee, initial encounter: Secondary | ICD-10-CM | POA: Insufficient documentation

## 2016-09-08 DIAGNOSIS — W19XXXA Unspecified fall, initial encounter: Secondary | ICD-10-CM | POA: Insufficient documentation

## 2016-09-08 NOTE — Assessment & Plan Note (Signed)
BP 189/82. He has not taken his carvedilol and amlodipine. Only took hydralazine this morning.   Continue amlodipine 10mg  daily, carvedilol 25mg  BID, hydralazine 100mg  TID. Consider addition of clonidine scheduled if he remains hypertensive at follow up. Follow up in 1-2 weeks.

## 2016-09-08 NOTE — Assessment & Plan Note (Signed)
Right knee with 4cm abrasion. No surrounding erythema or warmth. No purulent drainage. No effusion appreciated on exam of knee. Some bleeding in the upper medial aspect of wound as the surface was disturbed when removing the gauze.  Tefla applied. Continue dry dressings.

## 2016-09-08 NOTE — Assessment & Plan Note (Signed)
Assessment: Lower back pain since November. MRI with moderately severe bilateral neural foraminal stenosis at L4-L5 and L5-S1. He underwent epidural injection on the R L4-5 on 12/5. He notes some improvement with the injection. He does complain of pain uncontrolled by OTC pain medications still.  Plan: Encouraged patient to follow up with Dr. Alfonso Ramus Tramadol 50-100mg  q12hr prn severe pain Continue OTC Tylenol

## 2016-09-08 NOTE — Assessment & Plan Note (Signed)
[place] twice recently. He reports it is related to his back pain. No LOC or head trauma.  PT home safety eval

## 2016-09-08 NOTE — Progress Notes (Signed)
Internal Medicine Clinic Attending  Case discussed with Dr. Krall at the time of the visit.  We reviewed the resident's history and exam and pertinent patient test results.  I agree with the assessment, diagnosis, and plan of care documented in the resident's note.  

## 2016-09-08 NOTE — Assessment & Plan Note (Signed)
CT scan with multiple lung base pulmonary nodules measure up to 8 mm. Repeat CT chest in 3-6 months

## 2016-09-09 ENCOUNTER — Encounter (HOSPITAL_COMMUNITY): Payer: Self-pay | Admitting: Emergency Medicine

## 2016-09-09 ENCOUNTER — Emergency Department (HOSPITAL_COMMUNITY): Payer: Medicare Other

## 2016-09-09 ENCOUNTER — Other Ambulatory Visit: Payer: Self-pay

## 2016-09-09 ENCOUNTER — Inpatient Hospital Stay (HOSPITAL_COMMUNITY)
Admission: EM | Admit: 2016-09-09 | Discharge: 2016-09-12 | DRG: 640 | Disposition: A | Discharge status: Home / Self Care | Payer: Medicare Other | Attending: Internal Medicine | Admitting: Internal Medicine

## 2016-09-09 DIAGNOSIS — E8770 Fluid overload, unspecified: Principal | ICD-10-CM | POA: Diagnosis present

## 2016-09-09 DIAGNOSIS — J449 Chronic obstructive pulmonary disease, unspecified: Secondary | ICD-10-CM | POA: Diagnosis present

## 2016-09-09 DIAGNOSIS — I509 Heart failure, unspecified: Secondary | ICD-10-CM

## 2016-09-09 DIAGNOSIS — R634 Abnormal weight loss: Secondary | ICD-10-CM | POA: Diagnosis present

## 2016-09-09 DIAGNOSIS — M545 Low back pain, unspecified: Secondary | ICD-10-CM

## 2016-09-09 DIAGNOSIS — Z8249 Family history of ischemic heart disease and other diseases of the circulatory system: Secondary | ICD-10-CM

## 2016-09-09 DIAGNOSIS — B370 Candidal stomatitis: Secondary | ICD-10-CM | POA: Diagnosis present

## 2016-09-09 DIAGNOSIS — R918 Other nonspecific abnormal finding of lung field: Secondary | ICD-10-CM | POA: Diagnosis present

## 2016-09-09 DIAGNOSIS — B181 Chronic viral hepatitis B without delta-agent: Secondary | ICD-10-CM | POA: Diagnosis not present

## 2016-09-09 DIAGNOSIS — R0602 Shortness of breath: Secondary | ICD-10-CM | POA: Diagnosis not present

## 2016-09-09 DIAGNOSIS — B20 Human immunodeficiency virus [HIV] disease: Secondary | ICD-10-CM | POA: Diagnosis not present

## 2016-09-09 DIAGNOSIS — I351 Nonrheumatic aortic (valve) insufficiency: Secondary | ICD-10-CM | POA: Diagnosis present

## 2016-09-09 DIAGNOSIS — I10 Essential (primary) hypertension: Secondary | ICD-10-CM | POA: Diagnosis present

## 2016-09-09 DIAGNOSIS — Z880 Allergy status to penicillin: Secondary | ICD-10-CM

## 2016-09-09 DIAGNOSIS — N2581 Secondary hyperparathyroidism of renal origin: Secondary | ICD-10-CM | POA: Diagnosis present

## 2016-09-09 DIAGNOSIS — Z79899 Other long term (current) drug therapy: Secondary | ICD-10-CM

## 2016-09-09 DIAGNOSIS — D631 Anemia in chronic kidney disease: Secondary | ICD-10-CM | POA: Diagnosis present

## 2016-09-09 DIAGNOSIS — W19XXXA Unspecified fall, initial encounter: Secondary | ICD-10-CM | POA: Diagnosis present

## 2016-09-09 DIAGNOSIS — Z8619 Personal history of other infectious and parasitic diseases: Secondary | ICD-10-CM

## 2016-09-09 DIAGNOSIS — Z6824 Body mass index (BMI) 24.0-24.9, adult: Secondary | ICD-10-CM

## 2016-09-09 DIAGNOSIS — R7881 Bacteremia: Secondary | ICD-10-CM | POA: Diagnosis not present

## 2016-09-09 DIAGNOSIS — G8929 Other chronic pain: Secondary | ICD-10-CM | POA: Diagnosis present

## 2016-09-09 DIAGNOSIS — F40240 Claustrophobia: Secondary | ICD-10-CM | POA: Diagnosis present

## 2016-09-09 DIAGNOSIS — I120 Hypertensive chronic kidney disease with stage 5 chronic kidney disease or end stage renal disease: Secondary | ICD-10-CM | POA: Diagnosis not present

## 2016-09-09 DIAGNOSIS — Z992 Dependence on renal dialysis: Secondary | ICD-10-CM

## 2016-09-09 DIAGNOSIS — M4807 Spinal stenosis, lumbosacral region: Secondary | ICD-10-CM | POA: Diagnosis present

## 2016-09-09 DIAGNOSIS — M5136 Other intervertebral disc degeneration, lumbar region: Secondary | ICD-10-CM | POA: Diagnosis present

## 2016-09-09 DIAGNOSIS — N186 End stage renal disease: Secondary | ICD-10-CM | POA: Diagnosis present

## 2016-09-09 DIAGNOSIS — D649 Anemia, unspecified: Secondary | ICD-10-CM | POA: Diagnosis not present

## 2016-09-09 DIAGNOSIS — B1910 Unspecified viral hepatitis B without hepatic coma: Secondary | ICD-10-CM | POA: Diagnosis present

## 2016-09-09 DIAGNOSIS — D638 Anemia in other chronic diseases classified elsewhere: Secondary | ICD-10-CM | POA: Diagnosis present

## 2016-09-09 DIAGNOSIS — Z9181 History of falling: Secondary | ICD-10-CM

## 2016-09-09 DIAGNOSIS — M48061 Spinal stenosis, lumbar region without neurogenic claudication: Secondary | ICD-10-CM | POA: Diagnosis present

## 2016-09-09 DIAGNOSIS — M549 Dorsalgia, unspecified: Secondary | ICD-10-CM | POA: Diagnosis not present

## 2016-09-09 DIAGNOSIS — M47817 Spondylosis without myelopathy or radiculopathy, lumbosacral region: Secondary | ICD-10-CM | POA: Diagnosis present

## 2016-09-09 DIAGNOSIS — S80211A Abrasion, right knee, initial encounter: Secondary | ICD-10-CM | POA: Diagnosis present

## 2016-09-09 DIAGNOSIS — Y92009 Unspecified place in unspecified non-institutional (private) residence as the place of occurrence of the external cause: Secondary | ICD-10-CM

## 2016-09-09 DIAGNOSIS — R296 Repeated falls: Secondary | ICD-10-CM | POA: Diagnosis present

## 2016-09-09 DIAGNOSIS — R0600 Dyspnea, unspecified: Secondary | ICD-10-CM | POA: Diagnosis not present

## 2016-09-09 DIAGNOSIS — Z888 Allergy status to other drugs, medicaments and biological substances status: Secondary | ICD-10-CM

## 2016-09-09 DIAGNOSIS — Z87891 Personal history of nicotine dependence: Secondary | ICD-10-CM

## 2016-09-09 LAB — CBC WITH DIFFERENTIAL/PLATELET
BASOS ABS: 0 10*3/uL (ref 0.0–0.1)
Basophils Relative: 0 %
EOS ABS: 0.2 10*3/uL (ref 0.0–0.7)
Eosinophils Relative: 3 %
HEMATOCRIT: 19.9 % — AB (ref 39.0–52.0)
Hemoglobin: 6.3 g/dL — CL (ref 13.0–17.0)
LYMPHS PCT: 14 %
Lymphs Abs: 1 10*3/uL (ref 0.7–4.0)
MCH: 29.2 pg (ref 26.0–34.0)
MCHC: 31.7 g/dL (ref 30.0–36.0)
MCV: 92.1 fL (ref 78.0–100.0)
MONOS PCT: 8 %
Monocytes Absolute: 0.5 10*3/uL (ref 0.1–1.0)
NEUTROS ABS: 5.1 10*3/uL (ref 1.7–7.7)
Neutrophils Relative %: 75 %
Platelets: 139 10*3/uL — ABNORMAL LOW (ref 150–400)
RBC: 2.16 MIL/uL — ABNORMAL LOW (ref 4.22–5.81)
RDW: 18.2 % — AB (ref 11.5–15.5)
WBC: 6.8 10*3/uL (ref 4.0–10.5)

## 2016-09-09 LAB — BASIC METABOLIC PANEL
ANION GAP: 12 (ref 5–15)
BUN: 45 mg/dL — ABNORMAL HIGH (ref 6–20)
CALCIUM: 9.1 mg/dL (ref 8.9–10.3)
CO2: 26 mmol/L (ref 22–32)
Chloride: 95 mmol/L — ABNORMAL LOW (ref 101–111)
Creatinine, Ser: 9.46 mg/dL — ABNORMAL HIGH (ref 0.61–1.24)
GFR, EST AFRICAN AMERICAN: 6 mL/min — AB (ref >60)
GFR, EST NON AFRICAN AMERICAN: 5 mL/min — AB (ref >60)
GLUCOSE: 106 mg/dL — AB (ref 65–99)
POTASSIUM: 3.5 mmol/L (ref 3.5–5.1)
Sodium: 133 mmol/L — ABNORMAL LOW (ref 135–145)

## 2016-09-09 LAB — CBC
HCT: 25.4 % — ABNORMAL LOW (ref 39.0–52.0)
HEMOGLOBIN: 8.2 g/dL — AB (ref 13.0–17.0)
MCH: 29.3 pg (ref 26.0–34.0)
MCHC: 32.3 g/dL (ref 30.0–36.0)
MCV: 90.7 fL (ref 78.0–100.0)
Platelets: 174 10*3/uL (ref 150–400)
RBC: 2.8 MIL/uL — ABNORMAL LOW (ref 4.22–5.81)
RDW: 18.1 % — AB (ref 11.5–15.5)
WBC: 9.2 10*3/uL (ref 4.0–10.5)

## 2016-09-09 LAB — PREPARE RBC (CROSSMATCH)

## 2016-09-09 LAB — TROPONIN I: TROPONIN I: 0.05 ng/mL — AB (ref <0.03)

## 2016-09-09 LAB — BRAIN NATRIURETIC PEPTIDE: B Natriuretic Peptide: 1040.1 pg/mL — ABNORMAL HIGH (ref 0.0–100.0)

## 2016-09-09 MED ORDER — LAMIVUDINE 5 MG/ML PO SOLN: 25.0000 mg | Freq: Every day | ORAL | Status: DC

## 2016-09-09 MED ORDER — RENA-VITE PO TABS
1.0000 | ORAL_TABLET | Freq: Every day | ORAL | Status: DC
  Administered 2016-09-10 – 2016-09-12 (×2): 1 via ORAL
  Filled 2016-09-09 (×2): qty 1

## 2016-09-09 MED ORDER — PENTAFLUOROPROP-TETRAFLUOROETH EX AERO: 1.0000 "application " | INHALATION_SPRAY | CUTANEOUS | Status: DC | PRN

## 2016-09-09 MED ORDER — ENSURE ENLIVE PO LIQD
237.0000 mL | Freq: Two times a day (BID) | ORAL | Status: DC
  Administered 2016-09-10: 237 mL via ORAL

## 2016-09-09 MED ORDER — TRAZODONE HCL 50 MG PO TABS
50.0000 mg | ORAL_TABLET | Freq: Every evening | ORAL | Status: DC | PRN
  Administered 2016-09-10 – 2016-09-11 (×3): 50 mg via ORAL
  Filled 2016-09-09 (×3): qty 1

## 2016-09-09 MED ORDER — RALTEGRAVIR POTASSIUM 400 MG PO TABS
400.0000 mg | ORAL_TABLET | Freq: Two times a day (BID) | ORAL | Status: DC
  Administered 2016-09-09 – 2016-09-12 (×5): 400 mg via ORAL
  Filled 2016-09-09 (×8): qty 1

## 2016-09-09 MED ORDER — TENOFOVIR DISOPROXIL FUMARATE 300 MG PO TABS
300.0000 mg | ORAL_TABLET | ORAL | Status: DC
  Administered 2016-09-12: 300 mg via ORAL
  Filled 2016-09-09: qty 1

## 2016-09-09 MED ORDER — SODIUM CHLORIDE 0.9 % IV SOLN: 100.0000 mL | INTRAVENOUS | Status: DC | PRN

## 2016-09-09 MED ORDER — CALCIUM ACETATE (PHOS BINDER) 667 MG PO CAPS: 667.0000 mg | ORAL_CAPSULE | ORAL | Status: DC

## 2016-09-09 MED ORDER — SODIUM CHLORIDE 0.9 % IV SOLN
Freq: Once | INTRAVENOUS | Status: AC
  Administered 2016-09-09: 06:00:00 via INTRAVENOUS

## 2016-09-09 MED ORDER — ALBUTEROL SULFATE (2.5 MG/3ML) 0.083% IN NEBU
2.5000 mg | INHALATION_SOLUTION | Freq: Four times a day (QID) | RESPIRATORY_TRACT | Status: DC | PRN
  Administered 2016-09-10: 2.5 mg via RESPIRATORY_TRACT
  Filled 2016-09-09: qty 3

## 2016-09-09 MED ORDER — LAMIVUDINE 10 MG/ML PO SOLN
25.0000 mg | Freq: Every day | ORAL | Status: DC
  Administered 2016-09-10: 25 mg via ORAL
  Filled 2016-09-09 (×3): qty 5

## 2016-09-09 MED ORDER — LIDOCAINE-PRILOCAINE 2.5-2.5 % EX CREA: 1.0000 "application " | TOPICAL_CREAM | CUTANEOUS | Status: DC | PRN

## 2016-09-09 MED ORDER — AMLODIPINE BESYLATE 5 MG PO TABS
5.0000 mg | ORAL_TABLET | Freq: Two times a day (BID) | ORAL | Status: DC
  Administered 2016-09-09 – 2016-09-12 (×6): 5 mg via ORAL
  Filled 2016-09-09 (×6): qty 1

## 2016-09-09 MED ORDER — TRAMADOL HCL 50 MG PO TABS
50.0000 mg | ORAL_TABLET | Freq: Two times a day (BID) | ORAL | Status: DC | PRN
  Administered 2016-09-10: 50 mg via ORAL
  Filled 2016-09-09: qty 2
  Filled 2016-09-09: qty 1

## 2016-09-09 MED ORDER — HYDRALAZINE HCL 50 MG PO TABS
100.0000 mg | ORAL_TABLET | Freq: Three times a day (TID) | ORAL | Status: DC
  Administered 2016-09-09 – 2016-09-12 (×7): 100 mg via ORAL
  Filled 2016-09-09 (×7): qty 2

## 2016-09-09 MED ORDER — CARVEDILOL 25 MG PO TABS
25.0000 mg | ORAL_TABLET | Freq: Two times a day (BID) | ORAL | Status: DC
  Administered 2016-09-10 – 2016-09-12 (×3): 25 mg via ORAL
  Filled 2016-09-09 (×4): qty 1

## 2016-09-09 MED ORDER — CALCIUM ACETATE (PHOS BINDER) 667 MG PO CAPS
1334.0000 mg | ORAL_CAPSULE | Freq: Three times a day (TID) | ORAL | Status: DC
  Administered 2016-09-10 – 2016-09-12 (×4): 1334 mg via ORAL
  Filled 2016-09-09 (×7): qty 2

## 2016-09-09 MED ORDER — CALCIUM ACETATE (PHOS BINDER) 667 MG PO CAPS
667.0000 mg | ORAL_CAPSULE | ORAL | Status: DC
  Administered 2016-09-09: 1334 mg via ORAL
  Administered 2016-09-10: 667 mg via ORAL
  Administered 2016-09-11: 1334 mg via ORAL
  Administered 2016-09-12: 667 mg via ORAL
  Filled 2016-09-09: qty 1

## 2016-09-09 MED ORDER — LIDOCAINE HCL (PF) 1 % IJ SOLN: 5.0000 mL | INTRAMUSCULAR | Status: DC | PRN

## 2016-09-09 MED ORDER — OXYCODONE-ACETAMINOPHEN 5-325 MG PO TABS
1.0000 | ORAL_TABLET | Freq: Four times a day (QID) | ORAL | Status: DC | PRN
  Administered 2016-09-09 – 2016-09-12 (×6): 1 via ORAL
  Filled 2016-09-09 (×6): qty 1

## 2016-09-09 NOTE — ED Notes (Signed)
Pt presents via [facility] with complaints of SOB. Pt states he woke up about 0015 this morning. Pt reports he does home dialysis and did so Tuesday with no problems. Pt reports oxygen makes him feel better.

## 2016-09-09 NOTE — ED Notes (Signed)
Ordered heart healthy meal tray for pt. 

## 2016-09-09 NOTE — ED Notes (Signed)
Pt requested to be placed back on oxygen despite good numbers. Pt stated "I need oxygen. I don't care how good my numbers are."

## 2016-09-09 NOTE — ED Notes (Signed)
Patient transported to X-ray 

## 2016-09-09 NOTE — Addendum Note (Signed)
Addended by: Hulan Fray on: 09/09/2016 05:54 PM   Modules accepted: Orders

## 2016-09-09 NOTE — ED Provider Notes (Addendum)
[facility] DEPT Provider Note   CSN: [id number] Arrival date & time: 09/09/16  0116  By signing my name below, I, Jeffrey [name], attest that this documentation has been prepared under the direction and in the presence of Merryl Hacker, MD.  Electronically Signed: Julien Nordmann, ED Scribe. 09/09/16. 1:47 AM.    History   Chief Complaint Chief Complaint  Patient presents with  . Shortness of Breath    The history is provided by the patient. No language interpreter was used.   HPI Comments: Jeffrey [name] is a 60 y.o. male brought in by ambulance, who has a PMhx of ESRD, HIV, HTN, hyperthyroidism, gout presents to the Emergency Department complaining of sudden onset, moderate, shortness of breath that started around 0015 this morning. Pt does home hemodialysis about 3 days a week and reports having a full treatment this afternoon. Pt expresses that his shortness of breath is worse when he lays down. He is not on home O2. His baseline weight is 190 lbs. Pt reports having a lumbar injection on recently and his back has been giving him issues. He denies leg swelling, fever, cough, chest pain, nausea, vomiting, or diarrhea. Pt further denies hx of CAD.  Recent history of MSSA bacteremia. Reports finished a course of antibiotics at home. Reports blood cultures were taken by nephrologist on Monday. He does not know the results.  Past Medical History:  Diagnosis Date  . Anemia   . Anxiety   . Arthritis   . Asthma    per pt hx  . End stage renal disease on home HD 07/10/2011   Started HD in September 2012 at [facility] with a tunneled HD catheter, now on home HD with NxtStage. Dialyzing through AVF L lower arm with buttonhole technique as of mid 2014. His brother does the HD treatments at home.  They are roommates for 23 years.  The brother works 3rd shift and gets off about 8am and then puts [name] on HD in the morning after getting home. Most of the time he does HD about 4 times a week,  for about 4 hours per treatment. Cause of ESRD was HTN according to patient. He says he let his health go and ending up with complications, and that he didn't like seeing doctors in those days.  He says he was diagnosed with severe HTN when he lived in [place] in his 42's.   . Gout   . Hepatitis B carrier ([place])   . HIV infection ([place])   . Hypertension   . Hyperthyroidism   . Pneumonia   . Seizure ([place])   . Seizures ([place]) 06/02/2011   pt denies this  . Thrombocytopenia [facility])     Patient Active Problem List   Diagnosis Date Noted  . Fall 09/08/2016  . Knee abrasion, right, initial encounter 09/08/2016  . Lumbosacral spondylosis without myelopathy 08/03/2016  . Pulmonary nodules 08/03/2016  . Essential hypertension   . Anemia of chronic disease 04/08/2016  . Nuclear sclerotic cataract of both eyes 12/12/2013  . Myopia with presbyopia of both eyes 10/26/2013  . COPD (chronic obstructive pulmonary disease) ([place]) 06/27/2013  . Thrombocytopenia ([place]) 01/09/2013  . HBV (hepatitis B virus) infection 01/09/2013  . Anxiety 07/22/2011  . End stage renal disease on home HD 07/10/2011  . Human immunodeficiency virus (HIV) disease ([place]) 06/17/2011    Past Surgical History:  Procedure Laterality Date  . AV FISTULA PLACEMENT  06/02/11   Left radiocephalic AVF  . COLONOSCOPY    .  OTHER SURGICAL HISTORY     removal temporary HD catheter   . REVISON OF ARTERIOVENOUS FISTULA Left 10/10/2015   Procedure: REVISON OF LEFT RADIOCEPHALIC ARTERIOVENOUS FISTULA;  Surgeon: Angelia Mould, MD;  Location: [facility] [place];  Service: Vascular;  Laterality: Left;  . REVISON OF ARTERIOVENOUS FISTULA Left 02/07/2016   Procedure: REPAIR OF PSEUDO-ANEUREYSM OF LEFT ARM  ARTERIOVENOUS FISTULA;  Surgeon: Angelia Mould, MD;  Location: [place];  Service: Vascular;  Laterality: Left;  . REVISON OF ARTERIOVENOUS FISTULA Left 7/37/1062   Procedure: PLICATION OF LEFT ARM RADIOCEPHALIC ARTERIOVENOUS FISTULA  PSEUDOANEURYSM;  Surgeon: Angelia Mould, MD;  Location: [place];  Service: Vascular;  Laterality: Left;       Home Medications    Prior to Admission medications   Medication Sig Start Date End Date Taking? Authorizing Provider  albuterol (PROVENTIL) (2.5 MG/3ML) 0.083% nebulizer solution Take 2.5 mg by nebulization every 6 (six) hours as needed for wheezing or shortness of breath.    Historical Provider, MD  amLODipine (NORVASC) 10 MG tablet Take 5 mg by mouth 2 (two) times daily.     Historical Provider, MD  calcium acetate (PHOSLO) 667 MG capsule Take 1,334-2,001 mg by mouth 3 (three) times daily with meals. 2 capsules with Lunch and 3 capsules with Dinner 06/06/15   Historical Provider, MD  carvedilol (COREG) 25 MG tablet Take 25 mg by mouth 2 (two) times daily with a meal.    Historical Provider, MD  ceFAZolin (ANCEF) 2-4 GM/100ML-% IVPB Inject 100 mLs (2 g total) into the vein every Tuesday, Thursday, and Saturday at 6 PM. 08/08/16   Verlee Monte, MD  cloNIDine (CATAPRES) 0.1 MG tablet Take 0.1 mg by mouth as needed (as needed related to hypertension while on hemodialysis).  06/10/15   Historical Provider, MD  COMBIVENT RESPIMAT 20-100 MCG/ACT AERS respimat INHALE 1 PUFF BY MOUTH EVERY 4 HOURS AS NEEDED FOR WHEEZING OR SHORTNESS OF BREATH 06/09/16   Ophelia Shoulder, MD  EPIVIR HBV 5 MG/ML solution TAKE 5 ML BY MOUTH ONCE DAILY 10/01/15   Thayer Headings, MD  ethyl chloride spray Apply 1 application topically daily as needed. At HD 05/20/15   Historical Provider, MD  hydrALAZINE (APRESOLINE) 100 MG tablet Take 100 mg by mouth 3 (three) times daily.  08/02/16   Historical Provider, MD  ISENTRESS 400 MG tablet TAKE 1 TABLET(400 MG) BY MOUTH TWICE DAILY 08/03/16   Campbell Riches, MD  multivitamin (RENA-VIT) TABS tablet Take 1 tablet by mouth daily.      Historical Provider, MD  oxyCODONE-acetaminophen (PERCOCET/ROXICET) 5-325 MG tablet Take 1 tablet by mouth every 6 (six) hours as needed for  moderate pain or severe pain. 08/08/16   Verlee Monte, MD  tenofovir (VIREAD) 300 MG tablet Take 1 tablet (300 mg total) by mouth every Saturday. After dialysis on Saturday 01/27/16   Campbell Riches, MD  traMADol (ULTRAM) 50 MG tablet Take 1-2 tablets (50-100 mg total) by mouth every 12 (twelve) hours as needed. 09/07/16   Milagros Loll, MD  traZODone (DESYREL) 50 MG tablet Take 50 mg by mouth at bedtime as needed for sleep.    Historical Provider, MD    Family History Family History  Problem Relation Age of Onset  . Hypertension Mother   . Diabetes Mother   . Cancer Father     Social History Social History  Substance Use Topics  . Smoking status: Former Smoker    Types: Cigarettes    Quit  date: 08/08/2015  . Smokeless tobacco: Never Used  . Alcohol use No     Allergies   Lisinopril and Penicillins   Review of Systems Review of Systems  Constitutional: Negative for fever.  Respiratory: Positive for shortness of breath. Negative for cough.   Cardiovascular: Negative for chest pain and leg swelling.  Gastrointestinal: Negative for diarrhea, nausea and vomiting.  All other systems reviewed and are negative.    Physical Exam Updated Vital Signs BP 165/71   Pulse 75   Temp 98.2 F (36.8 C) (Oral)   Resp 19   Ht 6' (1.829 m)   Wt 190 lb (86.2 kg)   SpO2 99%   BMI 25.77 kg/m   Physical Exam  Constitutional: He is oriented to person, place, and time.  Chronically ill-appearing, no acute distress  HENT:  Head: Normocephalic and atraumatic.  Eyes: Pupils are equal, round, and reactive to light.  Cardiovascular: Normal rate, regular rhythm and normal heart sounds.   No murmur heard. Fistula left forearm, positive thrill  Pulmonary/Chest: Effort normal and breath sounds normal. No respiratory distress. He has no wheezes.  Crackles bilateral bases  Abdominal: Soft. Bowel sounds are normal. There is no tenderness. There is no rebound.  Musculoskeletal: He  exhibits edema.  Trace to 1+ bilateral lower extremity edema  Neurological: He is alert and oriented to person, place, and time.  Skin: Skin is warm and dry.  Psychiatric: He has a normal mood and affect.  Nursing note and vitals reviewed.    ED Treatments / Results  DIAGNOSTIC STUDIES: Oxygen Saturation is 97% on RA, normal by my interpretation.  COORDINATION OF CARE:  1:46 AM Discussed treatment plan with pt at bedside and pt agreed to plan.  Labs (all labs ordered are listed, but only abnormal results are displayed) Labs Reviewed  CBC WITH DIFFERENTIAL/PLATELET - Abnormal; Notable for the following:       Result Value   RBC 2.16 (*)    Hemoglobin 6.3 (*)    HCT 19.9 (*)    RDW 18.2 (*)    Platelets 139 (*)    All other components within normal limits  BASIC METABOLIC PANEL - Abnormal; Notable for the following:    Sodium 133 (*)    Chloride 95 (*)    Glucose, Bld 106 (*)    BUN 45 (*)    Creatinine, Ser 9.46 (*)    GFR calc non Af Amer 5 (*)    GFR calc Af Amer 6 (*)    All other components within normal limits  BRAIN NATRIURETIC PEPTIDE - Abnormal; Notable for the following:    B Natriuretic Peptide 1,040.1 (*)    All other components within normal limits  TROPONIN I - Abnormal; Notable for the following:    Troponin I 0.05 (*)    All other components within normal limits  TYPE AND SCREEN  PREPARE RBC (CROSSMATCH)    EKG  EKG Interpretation  Date/Time:  Wednesday September 09 2016 01:28:07 EST Ventricular Rate:  78 PR Interval:    QRS Duration: 108 QT Interval:  447 QTC Calculation: 510 R Axis:   64 Text Interpretation:  Sinus rhythm Prolonged PR interval Probable left atrial enlargement Nonspecific T abnormalities, lateral leads Prolonged QT interval Baseline wander in lead(s) I No significant change since last tracing Confirmed by Dina Rich  MD, COURTNEY ([id number]) on 09/09/2016 2:02:19 AM       Radiology Dg Chest 2 View  Result Date:  09/09/2016 CLINICAL DATA:  Dyspnea, onset this morning while in bed. EXAM: CHEST  2 VIEW COMPARISON:  08/03/2016 FINDINGS: Marked cardiomegaly, unchanged. Moderate vascular and interstitial prominence, new/ worsened. Small bilateral pleural effusions, new. Mild central airspace opacities. IMPRESSION: The findings likely represent congestive heart failure with interstitial and alveolar edema, as well as small bilateral effusions. Electronically Signed   By: Andreas Newport M.D.   On: 09/09/2016 02:28    Procedures Procedures (including critical care time)  Medications Ordered in ED Medications  0.9 %  sodium chloride infusion (not administered)     Initial Impression / Assessment and Plan / ED Course  I have reviewed the triage vital signs and the nursing notes.  Pertinent labs & imaging results that were available during my care of the patient were reviewed by me and considered in my medical decision making (see chart for details).  Clinical Course    Patient presents with shortness of breath. Denies infectious symptoms. Recent history of the same. He was admitted for symptomatic anemia and was also found to have MSSA bacteremia. Questionable vegetation. Per the discharge summary, he was to be on 6 weeks of antibiotics. However, it sounds as though he has only had 2. Repeat blood cultures pending by nephrologist per the patient. He is nontoxic. He does not appear overtly volume overloaded although BNP is elevated And chest x-ray consistent with likely pulmonary edema. Hemoglobin is again 6.3.  Patient type and screen. One unit of packed red cells ordered. Anticipate patient may need multiple transfusions. Given his need for dialysis and risk for worsening volume overload, will admit for further management. No O2 requirement at this time.  Final Clinical Impressions(s) / ED Diagnoses   Final diagnoses:  SOB (shortness of breath)  Symptomatic anemia   I personally performed the services  described in this documentation, which was scribed in my presence. The recorded information has been reviewed and is accurate.   New Prescriptions New Prescriptions   No medications on file     Merryl Hacker, MD 09/09/16 [id number]    Merryl Hacker, MD 09/09/16 [phone]

## 2016-09-09 NOTE — ED Notes (Signed)
Pt requesting pain medication but is refusing tramadol due to concern of it making him SOB.

## 2016-09-09 NOTE — ED Notes (Signed)
Ordered heart healthy meal tray.

## 2016-09-09 NOTE — Consult Note (Signed)
[facility] Renal Consultation Note    Indication for Consultation:  Management of ESRD/hemodialysis, anemia, hypertension/volume, and secondary hyperparathyroidism. PCP:  HPI: Jeffrey [name] is a 60 y.o. male with ESRD, HIV, Hep B, HTN, asthma who is being admitted with pulmonary edema and severe anemia.  S/p admit 07/2016 with MSSA bacteremia and back pain which improved with epidural injection. He completed 2 weeks of Ancef (was supposed to be 6 weeks per notes). Since that time, has been doing ok, but now with several days of weakness and cough. last night, developed acute dyspnea which prompted ED evaluation. CXR showed significant pulmonary edema and Hgb was 6.4, he received 1U PRBCs. Currently denies CP or abdominal pain. No N/V/D or fever, but has had chills.  From renal standpoint, has done home hemodialysis for the past 4 years using L AVF. Usually dialyzes 3x/week, sometimes 4. usually 4 hours, but recently 5 hours due to new aneurysm development on AVF which they have been concerned about. He had thought he was reaching EDW consistently, likely has lost weight. Definitely has excess volume today. Last given Mircera on 12/18. Dr. Joelyn Oms is his outpatient nephrologist.  Past Medical History:  Diagnosis Date  . Anemia   . Anxiety   . Arthritis   . Asthma    per pt hx  . End stage renal disease on home HD 07/10/2011   Started HD in September 2012 at [facility] with a tunneled HD catheter, now on home HD with NxtStage. Dialyzing through AVF L lower arm with buttonhole technique as of mid 2014. His brother does the HD treatments at home.  They are roommates for 23 years.  The brother works 3rd shift and gets off about 8am and then puts Jeffrey [name] on HD in the morning after getting home. Most of the time he does HD about 4 times a week, for about 4 hours per treatment. Cause of ESRD was HTN according to patient. He says he let his health go and ending up with complications, and that  he didn't like seeing doctors in those days.  He says he was diagnosed with severe HTN when he lived in [place] in his 70's.   . Gout   . Hepatitis B carrier ([place])   . HIV infection ([place])   . Hypertension   . Hyperthyroidism   . Pneumonia   . Seizure ([place])   . Seizures ([place]) 06/02/2011   pt denies this  . Thrombocytopenia ([place])    Past Surgical History:  Procedure Laterality Date  . AV FISTULA PLACEMENT  06/02/11   Left radiocephalic AVF  . COLONOSCOPY    . OTHER SURGICAL HISTORY     removal temporary HD catheter   . REVISON OF ARTERIOVENOUS FISTULA Left 10/10/2015   Procedure: REVISON OF LEFT RADIOCEPHALIC ARTERIOVENOUS FISTULA;  Surgeon: Angelia Mould, MD;  Location: [place];  Service: Vascular;  Laterality: Left;  . REVISON OF ARTERIOVENOUS FISTULA Left 02/07/2016   Procedure: REPAIR OF PSEUDO-ANEUREYSM OF LEFT ARM  ARTERIOVENOUS FISTULA;  Surgeon: Angelia Mould, MD;  Location: [place];  Service: Vascular;  Laterality: Left;  . REVISON OF ARTERIOVENOUS FISTULA Left 7/61/6073   Procedure: PLICATION OF LEFT ARM RADIOCEPHALIC ARTERIOVENOUS FISTULA PSEUDOANEURYSM;  Surgeon: Angelia Mould, MD;  Location: [facility] OR;  Service: Vascular;  Laterality: Left;   Family History  Problem Relation Age of Onset  . Hypertension Mother   . Diabetes Mother   . Cancer Father    Social History:  reports that he quit smoking about 13 months ago. His smoking use included Cigarettes. He has never used smokeless tobacco. He reports that he does not drink alcohol or use drugs.  ROS: As per HPI otherwise negative.  Physical Exam: Vitals:   09/09/16 0900 09/09/16 0930 09/09/16 1000 09/09/16 1030  BP: (!) 199/162 (!) 195/155 (!) 199/180 (!) 200/157  Pulse: 84 85 77 82  Resp: (!) 27 23 23 26   Temp:      TempSrc:      SpO2: 98% 99% 99% 100%  Weight:      Height:         General: Well developed, well nourished. On nasal oxygen, NAD. Head: Normocephalic, atraumatic, sclera  non-icteric, mucus membranes are moist. Neck: Supple without lymphadenopathy/masses. Lungs: Rales in B lung bases. Heart: RRR; 2/6 murmur. Abdomen: Soft, non-tender, non-distended with normoactive bowel sounds. No rebound/guarding. No obvious abdominal masses. Musculoskeletal:  Strength and tone appear normal for age. Lower extremities: Trace LE edema. Neuro: Alert and oriented X 3. Moves all extremities spontaneously. Psych:  Responds to questions appropriately with a normal affect. Dialysis Access: L forearm AVF +thrill/bruit; small aneurysm near antecubital area, overlying skin intact.  Allergies  Allergen Reactions  . Lisinopril Shortness Of Breath and Swelling    Throat swelling  . Penicillins Other (See Comments)    Childhood allergy Has patient had a PCN reaction causing immediate rash, facial/tongue/throat swelling, SOB or lightheadedness with hypotension: Unk Has patient had a PCN reaction causing severe rash involving mucus membranes or skin necrosis: Unk Has patient had a PCN reaction that required hospitalization: Unk Has patient had a PCN reaction occurring within the last 10 years: No If all of the above answers are "NO", then may proceed with Cephalosporin use.    Prior to Admission medications   Medication Sig Start Date End Date Taking? Authorizing Provider  albuterol (PROVENTIL) (2.5 MG/3ML) 0.083% nebulizer solution Take 2.5 mg by nebulization every 6 (six) hours as needed for wheezing or shortness of breath.   Yes Historical Provider, MD  amLODipine (NORVASC) 10 MG tablet Take 5 mg by mouth 2 (two) times daily.    Yes Historical Provider, MD  calcium acetate (PHOSLO) 667 MG capsule Take 667-2,001 mg by mouth See admin instructions. 1,334-2,001 mg three times a day with each meal and 667-1,334 mg with each snack 06/06/15  Yes Historical Provider, MD  carvedilol (COREG) 25 MG tablet Take 25 mg by mouth 2 (two) times daily with a meal.   Yes Historical Provider, MD   cloNIDine (CATAPRES) 0.1 MG tablet Take 0.1 mg by mouth as needed (as needed related to hypertension while on hemodialysis).  06/10/15  Yes Historical Provider, MD  EPIVIR HBV 5 MG/ML solution TAKE 5 ML BY MOUTH ONCE DAILY 10/01/15  Yes Thayer Headings, MD  ethyl chloride spray Apply 1 application topically daily as needed. At HD 05/20/15  Yes Historical Provider, MD  hydrALAZINE (APRESOLINE) 100 MG tablet Take 100 mg by mouth 3 (three) times daily.  08/02/16  Yes Historical Provider, MD  ISENTRESS 400 MG tablet TAKE 1 TABLET(400 MG) BY MOUTH TWICE DAILY 08/03/16  Yes Campbell Riches, MD  multivitamin (RENA-VIT) TABS tablet Take 1 tablet by mouth daily.     Yes Historical Provider, MD  tenofovir (VIREAD) 300 MG tablet Take 1 tablet (300 mg total) by mouth every Saturday. After dialysis on Saturday 01/27/16  Yes Campbell Riches, MD  traMADol (ULTRAM) 50 MG tablet Take 1-2 tablets (50-100  mg total) by mouth every 12 (twelve) hours as needed. Patient taking differently: Take 50-100 mg by mouth every 12 (twelve) hours as needed (for pain).  09/07/16  Yes Milagros Loll, MD  traZODone (DESYREL) 50 MG tablet Take 50 mg by mouth at bedtime as needed for sleep.   Yes Historical Provider, MD  ceFAZolin (ANCEF) 2-4 GM/100ML-% IVPB Inject 100 mLs (2 g total) into the vein every Tuesday, Thursday, and Saturday at 6 PM. Patient not taking: Reported on 09/09/2016 08/08/16   Verlee Monte, MD  COMBIVENT RESPIMAT 20-100 MCG/ACT AERS respimat INHALE 1 PUFF BY MOUTH EVERY 4 HOURS AS NEEDED FOR WHEEZING OR SHORTNESS OF BREATH Patient not taking: Reported on 09/09/2016 06/09/16   Ophelia Shoulder, MD  oxyCODONE-acetaminophen (PERCOCET/ROXICET) 5-325 MG tablet Take 1 tablet by mouth every 6 (six) hours as needed for moderate pain or severe pain. Patient not taking: Reported on 09/09/2016 08/08/16   Verlee Monte, MD   Current Facility-Administered Medications  Medication Dose Route Frequency Provider Last Rate Last Dose  .  albuterol (PROVENTIL) (2.5 MG/3ML) 0.083% nebulizer solution 2.5 mg  2.5 mg Nebulization Q6H PRN Tasrif Ahmed, MD      . amLODipine (NORVASC) tablet 5 mg  5 mg Oral BID Tasrif Ahmed, MD      . calcium acetate (PHOSLO) capsule 1,334 mg  1,334 mg Oral TID WC Tasrif Ahmed, MD       And  . calcium acetate (PHOSLO) capsule 412-8,786 mg  767-2,094 mg Oral With snacks Tasrif Ahmed, MD      . carvedilol (COREG) tablet 25 mg  25 mg Oral BID WC Tasrif Ahmed, MD      . hydrALAZINE (APRESOLINE) tablet 100 mg  100 mg Oral TID Tasrif Ahmed, MD      . lamiVUDine (EPIVIR) 10 MG/ML solution 25 mg  25 mg Oral Daily Tasrif Ahmed, MD      . multivitamin (RENA-VIT) tablet 1 tablet  1 tablet Oral Daily Tasrif Ahmed, MD      . raltegravir (ISENTRESS) tablet 400 mg  400 mg Oral BID Tasrif Ahmed, MD      . Derrill Memo ON 09/12/2016] tenofovir (VIREAD) tablet 300 mg  300 mg Oral Q Sat Tasrif Ahmed, MD      . traMADol (ULTRAM) tablet 50-100 mg  50-100 mg Oral Q12H PRN Tasrif Ahmed, MD      . traZODone (DESYREL) tablet 50 mg  50 mg Oral QHS PRN Dellia Nims, MD       Current Outpatient Prescriptions  Medication Sig Dispense Refill  . albuterol (PROVENTIL) (2.5 MG/3ML) 0.083% nebulizer solution Take 2.5 mg by nebulization every 6 (six) hours as needed for wheezing or shortness of breath.    Marland Kitchen amLODipine (NORVASC) 10 MG tablet Take 5 mg by mouth 2 (two) times daily.     . calcium acetate (PHOSLO) 667 MG capsule Take 667-2,001 mg by mouth See admin instructions. 1,334-2,001 mg three times a day with each meal and 667-1,334 mg with each snack    . carvedilol (COREG) 25 MG tablet Take 25 mg by mouth 2 (two) times daily with a meal.    . cloNIDine (CATAPRES) 0.1 MG tablet Take 0.1 mg by mouth as needed (as needed related to hypertension while on hemodialysis).   6  . EPIVIR HBV 5 MG/ML solution TAKE 5 ML BY MOUTH ONCE DAILY 240 mL 3  . ethyl chloride spray Apply 1 application topically daily as needed. At HD  12  . hydrALAZINE  (  APRESOLINE) 100 MG tablet Take 100 mg by mouth 3 (three) times daily.     . ISENTRESS 400 MG tablet TAKE 1 TABLET(400 MG) BY MOUTH TWICE DAILY 60 tablet 6  . multivitamin (RENA-VIT) TABS tablet Take 1 tablet by mouth daily.      Marland Kitchen tenofovir (VIREAD) 300 MG tablet Take 1 tablet (300 mg total) by mouth every Saturday. After dialysis on Saturday 5 tablet 5  . traMADol (ULTRAM) 50 MG tablet Take 1-2 tablets (50-100 mg total) by mouth every 12 (twelve) hours as needed. (Patient taking differently: Take 50-100 mg by mouth every 12 (twelve) hours as needed (for pain). ) 60 tablet 0  . traZODone (DESYREL) 50 MG tablet Take 50 mg by mouth at bedtime as needed for sleep.    Marland Kitchen ceFAZolin (ANCEF) 2-4 GM/100ML-% IVPB Inject 100 mLs (2 g total) into the vein every Tuesday, Thursday, and Saturday at 6 PM. (Patient not taking: Reported on 09/09/2016) 1 each 8  . COMBIVENT RESPIMAT 20-100 MCG/ACT AERS respimat INHALE 1 PUFF BY MOUTH EVERY 4 HOURS AS NEEDED FOR WHEEZING OR SHORTNESS OF BREATH (Patient not taking: Reported on 09/09/2016) 4 g 0  . oxyCODONE-acetaminophen (PERCOCET/ROXICET) 5-325 MG tablet Take 1 tablet by mouth every 6 (six) hours as needed for moderate pain or severe pain. (Patient not taking: Reported on 09/09/2016) 20 tablet 0   Labs: Basic Metabolic Panel:  Recent Labs Lab 09/09/16 0155  NA 133*  K 3.5  CL 95*  CO2 26  GLUCOSE 106*  BUN 45*  CREATININE 9.46*  CALCIUM 9.1   CBC:  Recent Labs Lab 09/09/16 0155  WBC 6.8  NEUTROABS 5.1  HGB 6.3*  HCT 19.9*  MCV 92.1  PLT 139*   Cardiac Enzymes:  Recent Labs Lab 09/09/16 0155  TROPONINI 0.05*   Studies/Results: Dg Chest 2 View  Result Date: 09/09/2016 CLINICAL DATA:  Dyspnea, onset this morning while in bed. EXAM: CHEST  2 VIEW COMPARISON:  08/03/2016 FINDINGS: Marked cardiomegaly, unchanged. Moderate vascular and interstitial prominence, new/ worsened. Small bilateral pleural effusions, new. Mild central airspace  opacities. IMPRESSION: The findings likely represent congestive heart failure with interstitial and alveolar edema, as well as small bilateral effusions. Electronically Signed   By: Andreas Newport M.D.   On: 09/09/2016 02:28    Dialysis Orders:  Home Hemodialysis (Nx stage) 3-4x/week, EDW 85kg, L AVF - Heparin 6000 unit bolus q HD - ESA: Last given Mircera 269mcg subq on 12/18 - No IV iron (last tsat 32% 12/13)  Assessment/Plan: 1.  Pulmonary edema/Volume overload: Will plan on HD to help volume correction. 2.  ESRD: HHD 3-4x/week. Will plan HD today (12/20) and likely again tomorrow pending symptoms. 3.  Hypertension/volume: BP high with volume excess. EDW will need to be lowered on d/c. 4.  Anemia: Hgb 6.3, 1U PRBCs given 12/20. Just dosed with ESA 2 days ago, none ordered for now. Denies GI losses, but would check guaiacs to be sure. 5.  Metabolic bone disease: Ca ok, continue binders. Will check Phos tomorrow. 6.  HIV: On HAART; per primary. 7. Recent MSSA bacteremia: This was not related to catheter, he has only had AVF for the past few years. There was question of possible aortic valve endocarditis. Off abx currently. Surveillance BCx being drawn per primary.  Veneta Penton, PA-C 09/09/2016, 11:32 AM  [facility] Pager: [phone]  Renal Attending: I agree with note as above.  He has a persistent anemia of uncertain cause despite Micera.  He did not get the full course of antibiotics.  He is losing weight.  Appetite not great.  He has some oral thrush. He has HIV under care.  He was recommended an OP TEE but it has not been done.  We suggest TEE and ID follow up. Tiran Sauseda C

## 2016-09-09 NOTE — Procedures (Signed)
Tolerating HD, obvious overload, stable hemodynamics. WIll need to establish a new EDW. Kamee Bobst C

## 2016-09-09 NOTE — ED Triage Notes (Signed)
[facility] reports that the patient woke up approximately 0015 with SOB. EMS reports that once the fire department sat the patient up he began to breathe easier and his oxygen saturation came up to 96% on room air. EMS reports pt is a home dialysis pt and last had dialysis on 09/08/16. Vital signs were 200/100, P-80, R-18, 100% on 3 liters. Pt is not on home oxygen.

## 2016-09-09 NOTE — H&P (Signed)
Date: 09/09/2016               Patient Name:  Jeffrey [name] MRN: [id number]  DOB: 1955-12-25 Age / Sex: 60 y.o., male   PCP: Rexene Agent, MD         Medical Service: Internal Medicine Teaching Service         Attending Physician: Dr. Oval Linsey, MD    First Contact: Dr. Philipp Ovens Pager: [phone]  Second Contact: Dr. Charlynn Grimes Pager: [phone]       After Hours (After 5p/  First Contact Pager: [phone]  weekends / holidays): Second Contact Pager: [phone]   Chief Complaint: SOB, anemia  History of Present Illness:   61 yo male with ESRD on home HD, HIV, HTN, COPD, chronic anemia, seizures, hyperthyroidism,  Presents to the ED for acute SOB starting overnight around midnight. Was feeling fine prior to this. No chest pain, palpitation, or any other symptom associated with this. Gets home HD 3-4 times weekly, has not missed any sessions, got last HD yesterday. Labs significant for WBC 11.7, trop 0.05, hgb 6.3 (MCV 92) and BNP 1040. Wt today 190, not increased from his baseline.  CXR showed cardiomegaly and interstitial edema suggestive of volume overload. Patient has low hgb at baseline around 7-8. He had symptomatic anemia during his last admission and also in the past requiring few units of pRBC tx. Denies any signs of bleeding.  Was recently admitted here 1 month ago with HD cath associated MSSA bacteremia. Initially the plan was 2 weeks of abx, but 2D echo results came after patient was discharged which showed " AI which worsened since 7/17", abx was then extended to 6 weeks and was instructed to follow up with cardiology outpatient for TEE per discharge summary but the patient states that no one has notified him about that. He took the abx for 2 weeks total.   Has lower back pain, mainly on the right back. Spinal injection helped the pain. Has been falling recently, fell 2x within last few days due to pain and leg weakness.   Meds:  Current Meds  Medication Sig  . albuterol  (PROVENTIL) (2.5 MG/3ML) 0.083% nebulizer solution Take 2.5 mg by nebulization every 6 (six) hours as needed for wheezing or shortness of breath.  Marland Kitchen amLODipine (NORVASC) 10 MG tablet Take 5 mg by mouth 2 (two) times daily.   . calcium acetate (PHOSLO) 667 MG capsule Take 667-2,001 mg by mouth See admin instructions. 1,334-2,001 mg three times a day with each meal and 667-1,334 mg with each snack  . carvedilol (COREG) 25 MG tablet Take 25 mg by mouth 2 (two) times daily with a meal.  . cloNIDine (CATAPRES) 0.1 MG tablet Take 0.1 mg by mouth as needed (as needed related to hypertension while on hemodialysis).   . EPIVIR HBV 5 MG/ML solution TAKE 5 ML BY MOUTH ONCE DAILY  . ethyl chloride spray Apply 1 application topically daily as needed. At HD  . hydrALAZINE (APRESOLINE) 100 MG tablet Take 100 mg by mouth 3 (three) times daily.   . ISENTRESS 400 MG tablet TAKE 1 TABLET(400 MG) BY MOUTH TWICE DAILY  . multivitamin (RENA-VIT) TABS tablet Take 1 tablet by mouth daily.    Marland Kitchen tenofovir (VIREAD) 300 MG tablet Take 1 tablet (300 mg total) by mouth every Saturday. After dialysis on Saturday  . traMADol (ULTRAM) 50 MG tablet Take 1-2 tablets (50-100 mg total) by mouth every 12 (twelve) hours as needed. (  Patient taking differently: Take 50-100 mg by mouth every 12 (twelve) hours as needed (for pain). )  . traZODone (DESYREL) 50 MG tablet Take 50 mg by mouth at bedtime as needed for sleep.     Allergies: Allergies as of 09/09/2016 - Review Complete 09/09/2016  Allergen Reaction Noted  . Lisinopril Shortness Of Breath and Swelling 08/31/2012  . Penicillins Other (See Comments)    Past Medical History:  Diagnosis Date  . Anemia   . Anxiety   . Arthritis   . Asthma    per pt hx  . End stage renal disease on home HD 07/10/2011   Started HD in September 2012 at [facility] with a tunneled HD catheter, now on home HD with NxtStage. Dialyzing through AVF L lower arm with buttonhole technique as of mid 2014. His  brother does the HD treatments at home.  They are roommates for 23 years.  The brother works 3rd shift and gets off about 8am and then puts Mr Canino on HD in the morning after getting home. Most of the time he does HD about 4 times a week, for about 4 hours per treatment. Cause of ESRD was HTN according to patient. He says he let his health go and ending up with complications, and that he didn't like seeing doctors in those days.  He says he was diagnosed with severe HTN when he lived in [place] in his 99's.   . Gout   . Hepatitis B carrier ([place])   . HIV infection ([place])   . Hypertension   . Hyperthyroidism   . Pneumonia   . Seizure ([place])   . Seizures ([place]) 06/02/2011   pt denies this  . Thrombocytopenia ([place])     Family History:  Family History  Problem Relation Age of Onset  . Hypertension Mother   . Diabetes Mother   . Cancer Father      Social History: Social History   Social History  . Marital status: Single    Spouse name: N/A  . Number of children: N/A  . Years of education: N/A   Social History Main Topics  . Smoking status: Former Smoker    Types: Cigarettes    Quit date: 08/08/2015  . Smokeless tobacco: Never Used  . Alcohol use No  . Drug use: No     Comment: uds (+) cocaine in 08/2011 but pt states was taking sudafed at the time  . Sexual activity: Not Currently    Partners: Male   Other Topics Concern  . 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 rate 25, height 6' (1.829 m), weight 190 lb (86.2 kg), SpO2 100 %.  Physical Exam  Constitutional: He is oriented to person, place, and time. He appears well-developed and well-nourished. No distress.  HENT:    Head: Normocephalic and atraumatic.  Mouth/Throat: Oropharynx is clear and moist. No oropharyngeal exudate.  Eyes: EOM are normal. Pupils are equal, round, and reactive to light.  Cardiovascular: Normal rate and regular rhythm.   Systolic murmur 3/6 at RUSP.   Respiratory: Effort normal.  No increased WOB. Has crackles upto mid lung on  both sides.   GI: Soft. Bowel sounds are normal. He exhibits no distension. There is no tenderness.  Musculoskeletal: Normal range of motion. He exhibits no edema.  Has an abrasion on the right knee covered with bandaid. Has right lower back pain with some limited ROM of the legs due to back pain.  str 5/5 on upper and lower ext. Sensation intact.   Neurological: He is alert and oriented to person, place, and time. No cranial nerve deficit.  Skin: He is not diaphoretic.  Psychiatric: He has a normal mood and affect.   EKG: NSR, no ST changes. PR slightly prolonged.   CXR: cardiomegaly with interstitial edema.   Assessment & Plan by Problem: Active Problems:   Human immunodeficiency virus (HIV) disease ([place])   End stage renal disease on home HD   HBV (hepatitis B virus) infection   COPD (chronic obstructive pulmonary disease) ([facility])   Anemia of chronic disease   Essential hypertension   Lumbosacral spondylosis without myelopathy   Pulmonary nodules   Symptomatic anemia  60 yo patient with HIV, ESRD on home HD, chronic anemia, here with acute SOB with low hgb and some interstitial edema on CXR.  SOB - multifactorial likely from combination of worsening anemia and volume overload CXR showing interstitial edema. He has CD4<200 last time so have to keep PCP on the differential. However, his acute onset, not being hypoxic, and CXR not having the usual pattern of PCP makes the diagnosis less likely. Other ddx which are also less likely include PE (not hypoxic or tachycardic but he is on bblocker, wells score of zero), ACS (trop elevated due to ESRD likely,  no chest pain or any other signs of ACS, EKG showed no ST changes).   His baseline hgb is 7-8 and now he is in 6.3. No signs of gross bleeding. This is likely the worsening of his chronic ESRD related anemia. May benefit from aranesp injections and IV iron. No recent iron panel. Will not obtain one now since it may be affected by the transfusion.  - getting 1 unit of pRBC, recheck CBC post tx. Goal >7.0. Assess for improvement of his symptoms. - will ask nephrology to see him, may need to adjust his dry weight to get more volume out to help with the interstitial edema  Previous MSSA HD cathether infection with questionable endocarditis Had MSSA bacteremia, was on Ancef. 2DEcHO showed "aortic valve sclerosis with moderate AI (vegetation cannot be excluded" Was supposed follow up with cards outpatient for TEE.  - repeat bcx since he is off abx. If he has persistent bacteremia then TEE would be required, otherwise we can monitor without TEE.  ESRD on home HD  -asked nephrology to help [name], will likely need new dry weight as above.   HIV AIDS - last CD4 count 160 on 11/17 - cont ART (tenofovir, raltegravir, lamivudine) - Consider PCP prophylaxis - recheck cd4 and viral load  HTN -cont coreg 25mg  bid, amlodipine 5mg , hydral 1000mg  tid,  COPD -cont albuterol   Chronic back pain MRI with moderately severe bilateral neural foraminal stenosis at L4-L5 and L5-S1. He underwent epidural injection on the R L4-5 on 12/5 with some improvement. -cont to manage conservatively with PT, pain control, ice/heat -consider surgery referral outpatient  Frequent falls  - likely 2/2 to back pain and some weakness asssociated with his Lumbar DDD. Will ask PT to evaluate him.  Lung nodules - seen previously upto 8 mm in size -Recommended 3-6 months  follow up CT. This can be done outpatient.     Dispo: Admit patient to Observation with expected length of stay less than 2 midnights.  Signed: Dellia Nims,  MD 09/09/2016, 8:37 AM  Pager: [phone]

## 2016-09-09 NOTE — Progress Notes (Signed)
Received Pt from Hemodialysis to room 6E19. Pt arrived in chair and is Alert and Oriented x 4. Pt placed on telemetry and vital signs obtained.

## 2016-09-09 NOTE — ED Notes (Signed)
Pt returned from X Ray.

## 2016-09-09 NOTE — ED Notes (Signed)
Pt signed blood consent.

## 2016-09-10 ENCOUNTER — Observation Stay (HOSPITAL_COMMUNITY): Payer: Medicare Other

## 2016-09-10 DIAGNOSIS — Z79899 Other long term (current) drug therapy: Secondary | ICD-10-CM | POA: Diagnosis not present

## 2016-09-10 DIAGNOSIS — M5136 Other intervertebral disc degeneration, lumbar region: Secondary | ICD-10-CM

## 2016-09-10 DIAGNOSIS — Z87891 Personal history of nicotine dependence: Secondary | ICD-10-CM | POA: Diagnosis not present

## 2016-09-10 DIAGNOSIS — R918 Other nonspecific abnormal finding of lung field: Secondary | ICD-10-CM | POA: Diagnosis not present

## 2016-09-10 DIAGNOSIS — Z8619 Personal history of other infectious and parasitic diseases: Secondary | ICD-10-CM | POA: Diagnosis not present

## 2016-09-10 DIAGNOSIS — I120 Hypertensive chronic kidney disease with stage 5 chronic kidney disease or end stage renal disease: Secondary | ICD-10-CM | POA: Diagnosis not present

## 2016-09-10 DIAGNOSIS — B181 Chronic viral hepatitis B without delta-agent: Secondary | ICD-10-CM | POA: Diagnosis present

## 2016-09-10 DIAGNOSIS — W19XXXA Unspecified fall, initial encounter: Secondary | ICD-10-CM | POA: Diagnosis present

## 2016-09-10 DIAGNOSIS — Z888 Allergy status to other drugs, medicaments and biological substances status: Secondary | ICD-10-CM | POA: Diagnosis not present

## 2016-09-10 DIAGNOSIS — E8770 Fluid overload, unspecified: Secondary | ICD-10-CM | POA: Diagnosis not present

## 2016-09-10 DIAGNOSIS — B20 Human immunodeficiency virus [HIV] disease: Secondary | ICD-10-CM | POA: Diagnosis not present

## 2016-09-10 DIAGNOSIS — R7881 Bacteremia: Secondary | ICD-10-CM | POA: Diagnosis not present

## 2016-09-10 DIAGNOSIS — Z21 Asymptomatic human immunodeficiency virus [HIV] infection status: Secondary | ICD-10-CM | POA: Diagnosis not present

## 2016-09-10 DIAGNOSIS — I351 Nonrheumatic aortic (valve) insufficiency: Secondary | ICD-10-CM | POA: Diagnosis not present

## 2016-09-10 DIAGNOSIS — M545 Low back pain, unspecified: Secondary | ICD-10-CM

## 2016-09-10 DIAGNOSIS — Z809 Family history of malignant neoplasm, unspecified: Secondary | ICD-10-CM

## 2016-09-10 DIAGNOSIS — S80211A Abrasion, right knee, initial encounter: Secondary | ICD-10-CM | POA: Diagnosis present

## 2016-09-10 DIAGNOSIS — Y92009 Unspecified place in unspecified non-institutional (private) residence as the place of occurrence of the external cause: Secondary | ICD-10-CM | POA: Diagnosis not present

## 2016-09-10 DIAGNOSIS — R296 Repeated falls: Secondary | ICD-10-CM | POA: Diagnosis present

## 2016-09-10 DIAGNOSIS — N186 End stage renal disease: Secondary | ICD-10-CM | POA: Diagnosis not present

## 2016-09-10 DIAGNOSIS — D631 Anemia in chronic kidney disease: Secondary | ICD-10-CM | POA: Diagnosis not present

## 2016-09-10 DIAGNOSIS — B370 Candidal stomatitis: Secondary | ICD-10-CM | POA: Diagnosis present

## 2016-09-10 DIAGNOSIS — Z881 Allergy status to other antibiotic agents status: Secondary | ICD-10-CM

## 2016-09-10 DIAGNOSIS — M5126 Other intervertebral disc displacement, lumbar region: Secondary | ICD-10-CM | POA: Diagnosis not present

## 2016-09-10 DIAGNOSIS — R0609 Other forms of dyspnea: Secondary | ICD-10-CM | POA: Diagnosis not present

## 2016-09-10 DIAGNOSIS — F40240 Claustrophobia: Secondary | ICD-10-CM | POA: Diagnosis present

## 2016-09-10 DIAGNOSIS — R0602 Shortness of breath: Secondary | ICD-10-CM | POA: Diagnosis not present

## 2016-09-10 DIAGNOSIS — J449 Chronic obstructive pulmonary disease, unspecified: Secondary | ICD-10-CM | POA: Diagnosis not present

## 2016-09-10 DIAGNOSIS — M4807 Spinal stenosis, lumbosacral region: Secondary | ICD-10-CM

## 2016-09-10 DIAGNOSIS — R634 Abnormal weight loss: Secondary | ICD-10-CM | POA: Diagnosis present

## 2016-09-10 DIAGNOSIS — G8929 Other chronic pain: Secondary | ICD-10-CM | POA: Diagnosis not present

## 2016-09-10 DIAGNOSIS — M48061 Spinal stenosis, lumbar region without neurogenic claudication: Secondary | ICD-10-CM | POA: Diagnosis present

## 2016-09-10 DIAGNOSIS — Z8249 Family history of ischemic heart disease and other diseases of the circulatory system: Secondary | ICD-10-CM | POA: Diagnosis not present

## 2016-09-10 DIAGNOSIS — Z833 Family history of diabetes mellitus: Secondary | ICD-10-CM

## 2016-09-10 DIAGNOSIS — M47817 Spondylosis without myelopathy or radiculopathy, lumbosacral region: Secondary | ICD-10-CM | POA: Diagnosis present

## 2016-09-10 DIAGNOSIS — Z880 Allergy status to penicillin: Secondary | ICD-10-CM | POA: Diagnosis not present

## 2016-09-10 DIAGNOSIS — I340 Nonrheumatic mitral (valve) insufficiency: Secondary | ICD-10-CM | POA: Diagnosis not present

## 2016-09-10 DIAGNOSIS — I509 Heart failure, unspecified: Secondary | ICD-10-CM | POA: Diagnosis not present

## 2016-09-10 DIAGNOSIS — Z992 Dependence on renal dialysis: Secondary | ICD-10-CM | POA: Diagnosis not present

## 2016-09-10 DIAGNOSIS — N2581 Secondary hyperparathyroidism of renal origin: Secondary | ICD-10-CM | POA: Diagnosis present

## 2016-09-10 LAB — RENAL FUNCTION PANEL
Albumin: 3 g/dL — ABNORMAL LOW (ref 3.5–5.0)
Anion gap: 10 (ref 5–15)
BUN: 10 mg/dL (ref 6–20)
CO2: 30 mmol/L (ref 22–32)
CREATININE: 3.6 mg/dL — AB (ref 0.61–1.24)
Calcium: 8.6 mg/dL — ABNORMAL LOW (ref 8.9–10.3)
Chloride: 98 mmol/L — ABNORMAL LOW (ref 101–111)
GFR calc non Af Amer: 17 mL/min — ABNORMAL LOW (ref >60)
GFR, EST AFRICAN AMERICAN: 19 mL/min — AB (ref >60)
Glucose, Bld: 115 mg/dL — ABNORMAL HIGH (ref 65–99)
Phosphorus: 1.2 mg/dL — ABNORMAL LOW (ref 2.5–4.6)
Potassium: 3.3 mmol/L — ABNORMAL LOW (ref 3.5–5.1)
Sodium: 138 mmol/L (ref 135–145)

## 2016-09-10 LAB — CBC
HCT: 22.3 % — ABNORMAL LOW (ref 39.0–52.0)
HEMATOCRIT: 22.5 % — AB (ref 39.0–52.0)
HEMOGLOBIN: 7.2 g/dL — AB (ref 13.0–17.0)
HEMOGLOBIN: 7.4 g/dL — AB (ref 13.0–17.0)
MCH: 29.9 pg (ref 26.0–34.0)
MCH: 30.3 pg (ref 26.0–34.0)
MCHC: 32.3 g/dL (ref 30.0–36.0)
MCHC: 32.9 g/dL (ref 30.0–36.0)
MCV: 92.5 fL (ref 78.0–100.0)
MCV: 92.2 fL (ref 78.0–100.0)
PLATELETS: 152 10*3/uL (ref 150–400)
Platelets: 146 10*3/uL — ABNORMAL LOW (ref 150–400)
RBC: 2.41 MIL/uL — ABNORMAL LOW (ref 4.22–5.81)
RBC: 2.44 MIL/uL — AB (ref 4.22–5.81)
RDW: 18.1 % — ABNORMAL HIGH (ref 11.5–15.5)
RDW: 17.8 % — AB (ref 11.5–15.5)
WBC: 7.2 10*3/uL (ref 4.0–10.5)
WBC: 7.8 10*3/uL (ref 4.0–10.5)

## 2016-09-10 LAB — HIV-1 RNA QUANT-NO REFLEX-BLD: LOG10 HIV-1 RNA: UNDETERMINED log10copy/mL

## 2016-09-10 LAB — BASIC METABOLIC PANEL
ANION GAP: 9 (ref 5–15)
BUN: 25 mg/dL — ABNORMAL HIGH (ref 6–20)
CALCIUM: 8.8 mg/dL — AB (ref 8.9–10.3)
CO2: 28 mmol/L (ref 22–32)
CREATININE: 6.09 mg/dL — AB (ref 0.61–1.24)
Chloride: 100 mmol/L — ABNORMAL LOW (ref 101–111)
GFR calc Af Amer: 10 mL/min — ABNORMAL LOW (ref >60)
GFR calc non Af Amer: 9 mL/min — ABNORMAL LOW (ref >60)
GLUCOSE: 120 mg/dL — AB (ref 65–99)
Potassium: 3.3 mmol/L — ABNORMAL LOW (ref 3.5–5.1)
Sodium: 137 mmol/L (ref 135–145)

## 2016-09-10 LAB — T-HELPER CELLS (CD4) COUNT (NOT AT ARMC)
CD4 T CELL ABS: 250 /uL — AB (ref 400–2700)
CD4 T CELL HELPER: 31 % — AB (ref 33–55)

## 2016-09-10 LAB — MRSA PCR SCREENING: MRSA BY PCR: NEGATIVE

## 2016-09-10 LAB — LACTATE DEHYDROGENASE: LDH: 153 U/L (ref 98–192)

## 2016-09-10 MED ORDER — NEPRO/CARBSTEADY PO LIQD
237.0000 mL | Freq: Two times a day (BID) | ORAL | Status: DC
  Administered 2016-09-10 – 2016-09-12 (×2): 237 mL via ORAL

## 2016-09-10 MED ORDER — ORAL CARE MOUTH RINSE
15.0000 mL | Freq: Two times a day (BID) | OROMUCOSAL | Status: DC
  Administered 2016-09-10 – 2016-09-12 (×3): 15 mL via OROMUCOSAL

## 2016-09-10 NOTE — Consult Note (Signed)
Date of Admission:  09/09/2016  Date of Consult:  09/10/2016  Reason for Consult: Possible aortic valve endocarditis due to coag negative staph bacteremia and end-stage disease patient on hemodialysis, also HIV disease  Referring Physician: Dr. Eppie Gibson   HPI:  Jeffrey [name] is an 60 y.o. male with HIV and hepatitis B coinfection, end-stage renal disease on hemodialysis. He is a patient that has been closely followed by Dr. Johnnye Sima for years. He has been highly compliant with his anti-retroviral therapy with viral loads having been less than 20 ("undetectable") or close to this since December of 2012. When I talked to about his antiretroviral regimen he quickly identified each part of his regimen including his ISENTRESS 40 mg twice daily, his liquid Epivir daily and his Viread taken once weekly.   [name] had been hospitalized in November with severe low back pain and found to have coagulase negative methicillin sensitive staphylococcal bacteremia. The [facility] mentions that it was a hemodialysis associated bacteremia though that is not entirely clear to me looking through the notes. It is a me that he has a AV fistula that has been placed for quite some time which has had some problems including recurrent bleeding episodes status post plication of radiocephalic AV fistula in July by Dr. Scot Dock.  The patient was treated with antibiotics and appropriately narrowed to cefazolin. Initial plans for for 2 weeks of IV cefazolin with dialysis--which he actually does at home with the assistance of his brother.  However at echocardiogram was performed which showed worsening aortic insufficiency raising the concern that the patient might have aortic valve endocarditis. The plan had been to extend his antibiotics to 6 weeks and to get him plugged in with cardiology to get a transesophageal echocardiogram. However this was never accomplished.  During that hospitalization he had an MRI  of his spine performed which did not show evidence of discitis. He still is however suffering from significant low back pain and I think it might be worthwhile getting a repeat MRI since I've seen discitis not show itself on an initial MRI but be seen a month later.  He was [facility] to [facility] with shortness of breath, and worsening anemia. His symptoms have improved in the hospital with hemodialysis. His admission chest x-ray showed volume overload with interstitial edema. It sounds like there has been some confusion with regards to what the patient's actual dry weight has been due to how he weighs himself at home versus how he is weighed in the hospital but regardless the patient had been volume overloaded due to insufficient hemodialysis at home. After hemodialysis here in the hospital ER he feels much better.  The team and been reviewing his labs and noted that his CD4 count had dropped to 160 in November when he was hospitalized with his bacteremia and sepsis. The percent CD4 was 22% slightly down from his baseline percent CD4 that runs in the 26-28 percentile range. Note a viral load was not performed during that hospitalization. Given his history I doubt that the drop of a CD4 count was due to problems with compliance with his antiretroviral therapy more likely it reflected the drop that acute [place] in the setting of acute illness such as his sepsis  Past Medical History:  Diagnosis Date  . Anemia   . Anxiety   . Arthritis   . Asthma    per pt hx  . End stage renal disease on home HD 07/10/2011  Started HD in September 2012 at [facility] with a tunneled HD catheter, now on home HD with NxtStage. Dialyzing through AVF L lower arm with buttonhole technique as of mid 2014. His brother does the HD treatments at home.  They are roommates for 23 years.  The brother works 3rd shift and gets off about 8am and then puts Mr Deshler on HD in the morning after getting home. Most of the time he does HD about 4  times a week, for about 4 hours per treatment. Cause of ESRD was HTN according to patient. He says he let his health go and ending up with complications, and that he didn't like seeing doctors in those days.  He says he was diagnosed with severe HTN when he lived in [place] in his 49's.   . Gout   . Hepatitis B carrier ([place])   . HIV infection ([place])   . Hypertension   . Hyperthyroidism   . Pneumonia   . Seizure ([place])   . Seizures ([place]) 06/02/2011   pt denies this  . Thrombocytopenia ([place])     Past Surgical History:  Procedure Laterality Date  . AV FISTULA PLACEMENT  06/02/11   Left radiocephalic AVF  . COLONOSCOPY    . OTHER SURGICAL HISTORY     removal temporary HD catheter   . REVISON OF ARTERIOVENOUS FISTULA Left 10/10/2015   Procedure: REVISON OF LEFT RADIOCEPHALIC ARTERIOVENOUS FISTULA;  Surgeon: Angelia Mould, MD;  Location: [place];  Service: Vascular;  Laterality: Left;  . REVISON OF ARTERIOVENOUS FISTULA Left 02/07/2016   Procedure: REPAIR OF PSEUDO-ANEUREYSM OF LEFT ARM  ARTERIOVENOUS FISTULA;  Surgeon: Angelia Mould, MD;  Location: [place];  Service: Vascular;  Laterality: Left;  . REVISON OF ARTERIOVENOUS FISTULA Left 1/93/7902   Procedure: PLICATION OF LEFT ARM RADIOCEPHALIC ARTERIOVENOUS FISTULA PSEUDOANEURYSM;  Surgeon: Angelia Mould, MD;  [facility];  Service: Vascular;  Laterality: Left;    Social History:  reports that he quit smoking about 13 months ago. His smoking use included Cigarettes. He has never used smokeless tobacco. He reports that he does not drink alcohol or use drugs.   Family History  Problem Relation Age of Onset  . Hypertension Mother   . Diabetes Mother   . Cancer Father     Allergies  Allergen Reactions  . Lisinopril Shortness Of Breath and Swelling    Throat swelling  . Penicillins Other (See Comments)    Childhood allergy Has patient had a PCN reaction causing immediate rash, facial/tongue/throat swelling, SOB  or lightheadedness with hypotension: Unk Has patient had a PCN reaction causing severe rash involving mucus membranes or skin necrosis: Unk Has patient had a PCN reaction that required hospitalization: Unk Has patient had a PCN reaction occurring within the last 10 years: No If all of the above answers are "NO", then may proceed with Cephalosporin use.      Medications: I have reviewed patients current medications as documented in Epic Anti-infectives    Start     Dose/Rate Route Frequency Ordered Stop   09/12/16 1000  tenofovir (VIREAD) tablet 300 mg     300 mg Oral Every Sat 09/09/16 0747     09/09/16 1100  raltegravir (ISENTRESS) tablet 400 mg     400 mg Oral 2 times daily 09/09/16 0747     09/09/16 1100  lamiVUDine (EPIVIR) 10 MG/ML solution 25 mg     25 mg Oral Daily 09/09/16 1043     09/09/16  1000  lamivudine (EPIVIR-HBV) 5 MG/ML solution 25 mg  Status:  Discontinued     25 mg Oral Daily 09/09/16 0747 09/09/16 1043         ROS:as in HPI otherwise remainder of 12 point Review of Systems is negative   Blood pressure 114/85, pulse 78, temperature 98 F (36.7 C), temperature source Oral, resp. rate 16, height 6' (1.829 m), weight 188 lb 11.4 oz (85.6 kg), SpO2 97 %. General: Alert and awake, oriented x3, not in any acute distress just finished HD HEENT: anicteric sclera,  EOMI, oropharynx clear and without exudate Cardiovascular: regular rate, normal r,  no murmur rubs or gallops Pulmonary: clear to auscultation bilaterally, no wheezing, rales or rhonchi Gastrointestinal: soft nontender, nondistended, normal bowel sounds, Musculoskeletal: no  clubbing or edema noted bilaterally Skin, soft tissue: no rashesm, AV site not overtly infected Neuro: nonfocal, strength and sensation intact   Results for orders placed or performed during the hospital encounter of 09/09/16 (from the past 48 hour(s))  CBC with Differential     Status: Abnormal   Collection Time: 09/09/16  1:55 AM    Result Value Ref Range   WBC 6.8 4.0 - 10.5 K/uL   RBC 2.16 (L) 4.22 - 5.81 MIL/uL   Hemoglobin 6.3 (LL) 13.0 - 17.0 g/dL    Comment: CRITICAL RESULT CALLED TO, READ BACK BY AND VERIFIED WITH: OLDLAND,B RN 12/20/20117 0254 JORDANS REPEATED TO VERIFY    HCT 19.9 (L) 39.0 - 52.0 %   MCV 92.1 78.0 - 100.0 fL   MCH 29.2 26.0 - 34.0 pg   MCHC 31.7 30.0 - 36.0 g/dL   RDW 18.2 (H) 11.5 - 15.5 %   Platelets 139 (L) 150 - 400 K/uL   Neutrophils Relative % 75 %   Lymphocytes Relative 14 %   Monocytes Relative 8 %   Eosinophils Relative 3 %   Basophils Relative 0 %   Neutro Abs 5.1 1.7 - 7.7 K/uL   Lymphs Abs 1.0 0.7 - 4.0 K/uL   Monocytes Absolute 0.5 0.1 - 1.0 K/uL   Eosinophils Absolute 0.2 0.0 - 0.7 K/uL   Basophils Absolute 0.0 0.0 - 0.1 K/uL  Basic metabolic panel     Status: Abnormal   Collection Time: 09/09/16  1:55 AM  Result Value Ref Range   Sodium 133 (L) 135 - 145 mmol/L   Potassium 3.5 3.5 - 5.1 mmol/L   Chloride 95 (L) 101 - 111 mmol/L   CO2 26 22 - 32 mmol/L   Glucose, Bld 106 (H) 65 - 99 mg/dL   BUN 45 (H) 6 - 20 mg/dL   Creatinine, Ser 9.46 (H) 0.61 - 1.24 mg/dL   Calcium 9.1 8.9 - 10.3 mg/dL   GFR calc non Af Amer 5 (L) >60 mL/min   GFR calc Af Amer 6 (L) >60 mL/min    Comment: (NOTE) The eGFR has been calculated using the CKD EPI equation. This calculation has not been validated in all clinical situations. eGFR's persistently <60 mL/min signify possible Chronic Kidney Disease.    Anion gap 12 5 - 15  Brain natriuretic peptide     Status: Abnormal   Collection Time: 09/09/16  1:55 AM  Result Value Ref Range   B Natriuretic Peptide 1,040.1 (H) 0.0 - 100.0 pg/mL  Troponin I     Status: Abnormal   Collection Time: 09/09/16  1:55 AM  Result Value Ref Range   Troponin I 0.05 (HH) <0.03 ng/mL    Comment:  CRITICAL RESULT CALLED TO, READ BACK BY AND VERIFIED WITH: OLDLAND B,RN 09/09/16 0252 WAYK   Lactate dehydrogenase     Status: None   Collection Time:  09/09/16  1:55 AM  Result Value Ref Range   LDH 153 98 - 192 U/L  Type and screen [place]     Status: None   Collection Time: 09/09/16  3:22 AM  Result Value Ref Range   ABO/RH(D) O NEG    Antibody Screen NEG    Sample Expiration 09/12/2016    Unit Number [id number]    Blood Component Type RED CELLS,LR    Unit division 00    Status of Unit ISSUED,FINAL    Transfusion Status OK TO TRANSFUSE    Crossmatch Result Compatible   Prepare RBC     Status: None   Collection Time: 09/09/16  3:22 AM  Result Value Ref Range   Order Confirmation ORDER PROCESSED BY BLOOD BANK   CBC     Status: Abnormal   Collection Time: 09/09/16  6:56 PM  Result Value Ref Range   WBC 9.2 4.0 - 10.5 K/uL   RBC 2.80 (L) 4.22 - 5.81 MIL/uL   Hemoglobin 8.2 (L) 13.0 - 17.0 g/dL    Comment: POST TRANSFUSION SPECIMEN   HCT 25.4 (L) 39.0 - 52.0 %   MCV 90.7 78.0 - 100.0 fL   MCH 29.3 26.0 - 34.0 pg   MCHC 32.3 30.0 - 36.0 g/dL   RDW 18.1 (H) 11.5 - 15.5 %   Platelets 174 150 - 400 K/uL  MRSA PCR Screening     Status: None   Collection Time: 09/09/16 10:21 PM  Result Value Ref Range   MRSA by PCR NEGATIVE NEGATIVE    Comment:        The GeneXpert MRSA Assay (FDA approved for NASAL specimens only), is one component of a comprehensive MRSA colonization surveillance program. It is not intended to diagnose MRSA infection nor to guide or monitor treatment for MRSA infections.   Basic metabolic panel     Status: Abnormal   Collection Time: 09/10/16  6:45 AM  Result Value Ref Range   Sodium 137 135 - 145 mmol/L   Potassium 3.3 (L) 3.5 - 5.1 mmol/L   Chloride 100 (L) 101 - 111 mmol/L   CO2 28 22 - 32 mmol/L   Glucose, Bld 120 (H) 65 - 99 mg/dL   BUN 25 (H) 6 - 20 mg/dL   Creatinine, Ser 6.09 (H) 0.61 - 1.24 mg/dL    Comment: RESULT REPEATED AND VERIFIED DELTA CHECK NOTED    Calcium 8.8 (L) 8.9 - 10.3 mg/dL   GFR calc non Af Amer 9 (L) >60 mL/min   GFR calc Af Amer 10 (L) >60  mL/min    Comment: (NOTE) The eGFR has been calculated using the CKD EPI equation. This calculation has not been validated in all clinical situations. eGFR's persistently <60 mL/min signify possible Chronic Kidney Disease.    Anion gap 9 5 - 15  CBC     Status: Abnormal   Collection Time: 09/10/16  6:45 AM  Result Value Ref Range   WBC 7.2 4.0 - 10.5 K/uL   RBC 2.41 (L) 4.22 - 5.81 MIL/uL   Hemoglobin 7.2 (L) 13.0 - 17.0 g/dL   HCT 22.3 (L) 39.0 - 52.0 %   MCV 92.5 78.0 - 100.0 fL   MCH 29.9 26.0 - 34.0 pg   MCHC 32.3 30.0 - 36.0 g/dL  RDW 18.1 (H) 11.5 - 15.5 %   Platelets 146 (L) 150 - 400 K/uL   '@BRIEFLABTABLE' (sdes,specrequest,cult,reptstatus)   ) Recent Results (from the past 720 hour(s))  MRSA PCR Screening     Status: None   Collection Time: 09/09/16 10:21 PM  Result Value Ref Range Status   MRSA by PCR NEGATIVE NEGATIVE Final    Comment:        The GeneXpert MRSA Assay (FDA approved for NASAL specimens only), is one component of a comprehensive MRSA colonization surveillance program. It is not intended to diagnose MRSA infection nor to guide or monitor treatment for MRSA infections.      Impression/Recommendation  Active Problems:   Human immunodeficiency virus (HIV) disease ([place])   End stage renal disease on home HD   HBV (hepatitis B virus) infection   COPD (chronic obstructive pulmonary disease) ([facility])   Anemia of chronic disease   Essential hypertension   Lumbosacral spondylosis without myelopathy   Pulmonary nodules   Fall   Symptomatic anemia   EATHON VALADE is a 60 y.o. male with  HIV disease and a record of near perfect compliance with antiretroviral therapy since 2012 with viral loads nearly invariably undetectable or close to undetectable every time that been checked. He had done so well in fact that he was a candidate for renal transplantation and evaluated at [facility] Unfortunately he had severe calcification of his vessels and they felt the  transplantation would be too hazardous. He also does have hep B coinfection he has end-stage renal disease and on hemodialysis via AV fistula he was admitted with coagulase negative staphylococcal bacteremia in the setting of severe low back pain in November. 2-D echocardiogram had shown worsening aortic insufficiency raising's suspicion for endocarditis. Unfortunately he was only treated with 2 weeks of IV antibiotics rather than 6 and did not get a transesophageal echocardiogram.  #1 HIV disease: I'm very confident based on this patient's history of high compliance with his antiretroviral therapy that he is continuing to be as compliant as he was previously and that his viral load is undetectable or close to undetectable. I suspect his CD4 count is also recovered from the value in November when it dropped likely not due to HIV itself but due to his septicemia.  He is on a regimen of ISENTRESS twice daily Epivir once daily and Viread once weekly. This is a highly tolerable regimen. One could consider other ones but given that he wants to always highly research any medication he is placed on there is no reason for me to open up discussions about changing this regimen.  Repeat viral load should prove or disprove my conclusion.  #2 coag negative staphylococcal bacteremia that was methicillin sensitive and occurred in the setting of severe low back pain and a 2-D echocardiogram showing aortic insufficiency.  --Clearly he needs a transesophageal echocardiogram to determine if he has evidence of aortic valve endocarditis.  --- Blood cultures have been repeated  --I would also repeat an MRI of his lumbar spine. I've seen patients present with bacteremia and low back pain and have a normal MRI of the spine only 2 present a month or 2 later with evidence of discitis and epidural abscess that flourished in the interim.     09/10/2016, 12:51 PM   Thank you so much for this interesting consult  [place] [facility] [place] [phone] (pager) [phone] (office) 09/10/2016, 12:51 PM  [place] 09/10/2016, 12:51 PM

## 2016-09-10 NOTE — Progress Notes (Signed)
Initial Nutrition Assessment  DOCUMENTATION CODES:   Not applicable  INTERVENTION:  Discontinue Ensure.   Provide Nepro Shake po BID, each supplement provides 425 kcal and 19 grams protein.  Encourage adequate PO intake.   NUTRITION DIAGNOSIS:   Increased nutrient needs related to chronic illness as evidenced by estimated needs.  GOAL:   Patient will meet greater than or equal to 90% of their needs  MONITOR:   PO intake, Supplement acceptance, Labs, Weight trends, Skin, I & O's  REASON FOR ASSESSMENT:   Malnutrition Screening Tool    ASSESSMENT:    60 year old man with a history of end-stage renal disease on home hemodialysis, HIV with a CD4 count of 160 in November, chronic anemia, and hypertension who presents with the acute onset of shortness of breath at approximately midnight on the day of admission.   Pt was unavailable during attempted time of visit. RD unable to obtain nutrition history. Per weight records, pt with a 12.8% weight loss in 7 months. Pt additionally with weight loss since admission which likely related to fluid status. Meal completion has been 50%. Pt currently has Ensure ordered. RD to modify and order Nepro Shake instead as pt currently on a renal diet and Nepro having less potassium and phosphorous than Ensure. Unable to complete Nutrition-Focused physical exam at this time.   Labs and medications reviewed.   Diet Order:  Diet renal with fluid restriction Fluid restriction: 1200 mL Fluid; Room service appropriate? Yes; Fluid consistency: Thin  Skin:  Reviewed, no issues  Last BM:  12/20  Height:   Ht Readings from Last 1 Encounters:  09/09/16 6' (1.829 m)    Weight:   Wt Readings from Last 1 Encounters:  09/10/16 182 lb 15.7 oz (83 kg)    Ideal Body Weight:  80.9 kg  BMI:  Body mass index is 24.82 kg/m.  Estimated Nutritional Needs:   Kcal:  2200-2400  Protein:  105-120 grams  Fluid:  1.2 L/day  EDUCATION NEEDS:   No  education needs identified at this time  Corrin Parker, MS, RD, LDN Pager # [phone] After hours/ weekend pager # [phone]

## 2016-09-10 NOTE — H&P (Signed)
Internal Medicine Attending Admission Note Date: 09/10/2016  Patient name: Jeffrey [name] Medical record number: [id number] Date of birth: 1956/01/07 Age: 60 y.o. Gender: male  I saw and evaluated the patient. I reviewed the resident's note and I agree with the resident's findings and plan as documented in the resident's note.  Chief Complaint(s): Shortness of breath.  History - key components related to admission:  Jeffrey [name] is a 60 year old man with a history of end-stage renal disease on home hemodialysis, HIV with a CD4 count of 160 in November, chronic anemia, and hypertension who presents with the acute onset of shortness of breath at approximately midnight on the day of admission. He denied any fevers, shakes, chills, or chest pain. He suspects he has been losing weight and that his estimated dry weight was too high. In fact, he personally had lowered his estimated dry weight by 1 pound recently because of this. He presented to the emergency department with this shortness of breath and was admitted to the internal medicine teaching service for further evaluation and care. He underwent hemodialysis with marked improvement in his dyspnea.  Other important things to consider include the fact that he had an MSSA bacteremia from an unclear source in November associated with a worsening of his aortic insufficiency from mild to moderate over a period of only 4 months. He did receive 2 weeks of antibiotics for this bacteremia even though the intention was that he receive 6 weeks. Attempts were made to contact him for a TEE to further evaluate this worsening aortic insufficiency but were apparently unsuccessful. He also notes some chronic low back pain and some recent falls. Even though his CD4 count had fallen below 200 he has not been on PCP prophylaxis.  When seen on rounds the morning after admission he felt much better from a dyspnea standpoint.  Physical Exam - key components related to  admission:  Vitals:   09/10/16 0930 09/10/16 1000 09/10/16 1030 09/10/16 1100  BP: 102/85 114/85    Pulse: 81 72 79 78  Resp: 20 16    Temp:      TempSrc:      SpO2:      Weight:      Height:       Gen.: Well-developed, well-nourished, man lying comfortably in bed in no acute distress. Lungs: Bibasilar inspiratory crackles without wheezes, rhonchi, rales. Heart: Regular rate and rhythm with a 2/6 systolic murmur in the aortic area without radiation. I was unable to appreciate a diastolic murmur in the aortic area.  Lab results:  Basic Metabolic Panel:  Recent Labs  09/09/16 0155 09/10/16 0645  NA 133* 137  K 3.5 3.3*  CL 95* 100*  CO2 26 28  GLUCOSE 106* 120*  BUN 45* 25*  CREATININE 9.46* 6.09*  CALCIUM 9.1 8.8*   CBC:  Recent Labs  09/09/16 0155 09/09/16 1856 09/10/16 0645  WBC 6.8 9.2 7.2  NEUTROABS 5.1  --   --   HGB 6.3* 8.2* 7.2*  HCT 19.9* 25.4* 22.3*  MCV 92.1 90.7 92.5  PLT 139* 174 146*   Cardiac Enzymes:  Recent Labs  09/09/16 0155  TROPONINI 0.05*   Misc. Labs:  Blood culture 2 pending BNP 1040.1 LDH 153 CD4 count pending HIV-1 RNA quantitative pending  Imaging results:  Dg Chest 2 View  Result Date: 09/09/2016 CLINICAL DATA:  Dyspnea, onset this morning while in bed. EXAM: CHEST  2 VIEW COMPARISON:  08/03/2016 FINDINGS: Marked cardiomegaly, unchanged. Moderate vascular and  interstitial prominence, new/ worsened. Small bilateral pleural effusions, new. Mild central airspace opacities. IMPRESSION: The findings likely represent congestive heart failure with interstitial and alveolar edema, as well as small bilateral effusions. Electronically Signed   By: Andreas Newport M.D.   On: 09/09/2016 02:28   I personally reviewed the chest x-ray which revealed I lateral small pleural effusions and interstitial infiltrates with cephalization.  Other results:  EKG: Normal sinus rhythm at 78 bpm, normal axis, normal intervals, no significant Q  waves, no LVH by voltage, poor R wave progression, lateral limb lead T-wave flattening, no significant change from the previous ECG on 07/29/2016.  Assessment & Plan by Problem:  Jeffrey [name] is a 60 year old man with a history of end-stage renal disease on home hemodialysis, HIV with a CD4 count of 160 in November, chronic anemia, and hypertension who presents with the acute onset of shortness of breath at approximately midnight on the day of admission. The most likely cause of his shortness of breath was his volume overload. He likely has been losing weight and his estimated dry weight was lower than where he has been dialyzed 2. In addition, his significant anemia was likely symptomatic as well. He has not been responding to erythrocyte stimulating agents. He is also not been on iron therapy. Thus he possibly has been iron deficient. With a normal LDH it is unlikely he is been hemolyzing and also unlikely he has PCP pneumonia despite a CD4 count less than 200, especially given how quickly his dyspnea improved with hemodialysis and volume removal.  Of more concern is the fact that he's had quickly progressing aortic insufficiency in the setting of MSSA bacteremia. There is concerned this may be related to an endocarditis that would have been inadequately treated with 2 weeks of antibiotics. I do not believe he has seeded his spine, despite the chronic low back pain, because of the negative MRI he had in November. That being said, if he has had progressive back pain and is continuing to be bacteremic this may need to be revisited.  1) Volume overload: He will continue to have volume removed via hemodialysis. We will try to establish a new estimated dry weight.  2) Anemia: He will be transfused to keep hemoglobin above 7.0. Ideally, this will occur with hemodialysis as not to increase his volume. Given that he is relatively asymptomatic with the volume removal a hemoglobin level in the sevens may be  sufficient. One could consider giving him iron supplementation and continue with the erythrocyte stimulating agents.  3) Worsening aortic insufficiency: In the setting of a recent MSSA bacteremia there is some concern this may be related to an endocarditis. We will consult cardiology to consider a TEE to assess for endocarditis given this. Blood cultures are pending at this time and we are holding antibiotics in case he does have endocarditis. This should allow [name] to identify the bacteria and obtain appropriate sensitivities to narrow our therapy were it to be necessary.  4) AIDS: Infectious disease has been consulted as he failed to make his follow-up appointment in August and given that he has a CD4 count in November that is less than 200 and not being prophylaxed. The CD4 count and viral load that were drawn on admission are pending at this time. We appreciate their assistance in addressing all of the issues associated with his diagnosis of AIDS.  5) Disposition: He has enough issues going on that I am very doubtful that he will be fully assessed  and safely treated for discharge within 2 midnights.

## 2016-09-10 NOTE — Progress Notes (Signed)
Subjective: Patient reports his breathing has improved significantly after his blood transfusion and HD yesterday. Continues to complain of pain in his back that is unchanged.   Objective:  Vital signs in last 24 hours: Vitals:   09/10/16 0805 09/10/16 0830 09/10/16 0900 09/10/16 0930  BP: 114/86 109/85 102/85 102/85  Pulse: 80 80 78 81  Resp: 15 15 18 20   Temp:      TempSrc:      SpO2:      Weight:      Height:       Physical Exam Constitutional: NAD, appears comfortable Cardiovascular: RRR, 3/6 systolic murmur Pulmonary/Chest: Bibasilar crackles  Abdominal: Soft, non tender, non distended. +BS.  Extremities: Warm and well perfused. Distal pulses intact. No edema. LUE AVF, Right knee abrasion  Neurological: A&Ox3, CN II - XII grossly intact.  Skin: No rashes or erythema  Psychiatric: Normal mood and affect  Assessment/Plan:  SOB: Likely multifactorial due to acute on chronic anemia (baseline 7-8) and volume overload. S/p 1 unit of pRBCs and HD yesterday with improvement in symptoms. Post transfusion hemoglobin with good response, 6.3 -> 8.2, down to 7.2 today. No active signs of bleeding. Patient is ESRD on ESA therapy, not receiving iron supplementation. It is possible that patient is iron deficient however given his blood transfusion, iron panel would likely be inaccurate at this point. Another consideration would be his worsening aortic insufficiency seen on echocardiogram last month. Hemolysis is definitely a possibility. Discussed with the lab, we will add on an LDH to his admission labs from yesterday. Unfortunately we will not be able to add on an iron panel as that test requires a specific tube.  -- Add on LDH to admission labs  -- Daily CBC -- Transfuse prn to maintain hgb > 7.0  -- Mircera per nephrology   ESRD on HD: Reports compliance with home HD, usually dialyzes 3-4 times a week. Patient reports he has lost weight recently and believes he needs a new dry weight.  Volume up on admission. S/p HD yesterday, planning another HD session today. SOB significantly improved.  -- Nephrology following, appreciate recs   -- HD today  -- Last Mircera on 12/18  -- Continue Phoslo  Hx of MSSA Bacteremia: Hospitalized 11/13 - 11/18 for MSSA bacteremia, unclear source. Discharge on Ancef for 2 weeks. Echo was performed on the day of discharge and resulted with moderate AI (vegetation cannot be excluded) worse from prior echo 4 months ago. Patient was suppose to follow up with cards for an outpatient TEE and extend his antibiotic course for a total of 6 weeks, however patient says he was never called with these results and was unaware. He completed the two weeks and has been asymptomatic until he developed this episode of SOB. Patient is currently afebrile and hemodynamically stable.  -- Repeat blood cultures pending  -- TEE tomorrow  -- Will hold off on further antibiotics for now pending blood cx and TEE results   HIV AIDs: Patient follows with Dr. Johnnye Sima and reports compliance with his ART (tenofovir, raltegravir, lamivudine). CD4 count in January was 520 but appears falling, 160 during his last hospitalization in November. CD4 count and HIV viral load pending from this hospitalization. Patient was suppose to follow up with ID in August but per chart review appears he missed his appointment. Patient may need PCP prophylaxis.  -- ID consult, appreciate recommendations  -- F/u CD4 count and viral load  -- Continue ART (tenofovir, raltegravir, lamivudine)  Back Pain: MRI from his prior hospitalization with severe bilateral neural foraminal stenosis at L4-L5 and L5-S1. He underwent epidural injection on the R L4-5 on 12/5 with some improvement. However in the setting of his recent MSSA bacteremia, if blood cultures result positive we will likely re image to rule out a source of infection. Patient is afebrile and hemodynamically stable.  -- PT/OT -- Ice/heat  -- Continue  home percocet 1 mg q6 hours prn   HTN: Normotensive on home regimen  -- Continue home Coreg 25 mg BID, amlodipine 5 mg daily, and hydralazine 1000 mg TID   COPD: -- Continue albuterol   Lung nodules: Found incidentally on CT renal stone study during his last admission. Multiple nodules measuring up to 8 mm noted at his bases.  -- Follow up CT chest 3-6 months  FEN: No fluids, hemodialysis, renal diet VTE ppx: SCDs Code Status: FULL   Dispo: Anticipated discharge in approximately 1-2 days pending ID consult, TEE, and blood cultures.   Jeffrey Ochs, MD 09/10/2016, 10:10 AM Pager: [phone]

## 2016-09-10 NOTE — Progress Notes (Signed)
[facility] has been requested to perform a transesophageal echocardiogram on 09/11/16 for bacteremia.  After careful review of history and examination, the risks and benefits of transesophageal echocardiogram have been explained including risks of esophageal damage, perforation (1:10,000 risk), bleeding, pharyngeal hematoma as well as other potential complications associated with conscious sedation including aspiration, arrhythmia, respiratory failure and death. Alternatives to treatment were discussed, questions were answered. Patient is willing to proceed.  TEE - Dr. Meda Coffee @ 12:00 . NPO after midnight. Meds with sips ok   Arbutus Leas, NP 09/10/2016 4:31 PM

## 2016-09-10 NOTE — Procedures (Signed)
[name] He feels better, but needs more dialysis to get to new lower EDW. ? of vegetation raised and need for TEE last admission.  Also wonder if valvular issue responisble for some cardiac decompensation. Will obtain CXR but anticipate we need several more Kg drop to optimize volume status. Jeffrey [name] C

## 2016-09-10 NOTE — Progress Notes (Signed)
Pt was unable to complete exam due to shortness of breath. Contacted RN Carly letting her know pt did not want to finish exam.

## 2016-09-10 NOTE — Evaluation (Signed)
Physical Therapy Evaluation Patient Details Name: Jeffrey [name] MRN: [id number] DOB: 03/17/1956 Today's Date: 09/10/2016   History of Present Illness  pt presents with Aortic Valva Endocarditis and Back Pain.  pt with hx of HIV, Hep C, ESRD, Anxiety, Gout, and Seizures.    Clinical Impression  Pt very motivated, however fatigued after HD today.  Pt generally weak and would benefit from continued therapy to maximize independence prior to returning to home with brother's support.  Discussed with pt needing a new tip for his cane as pt's cane has wooden bottom which is slick on the floor.  Pt denied need for new cane or tip.  Will continue to follow.      Follow Up Recommendations Home health PT;Supervision - Intermittent    Equipment Recommendations  3in1 (PT)    Recommendations for Other Services       Precautions / Restrictions Precautions Precautions: Fall Restrictions Weight Bearing Restrictions: No      Mobility  Bed Mobility               General bed mobility comments: pt sitting in recliner.  Transfers Overall transfer level: Needs assistance Equipment used: Straight cane Transfers: Sit to/from Stand Sit to Stand: Supervision         General transfer comment: pt able to come to stand without A, but does indicate fatigue and weakness.    Ambulation/Gait Ambulation/Gait assistance: Min guard Ambulation Distance (Feet): 50 Feet Assistive device: Straight cane Gait Pattern/deviations: Step-through pattern;Decreased stride length     General Gait Details: pt moves slowly and indicates fatigue and Bil LEs feeling weak.  pt able to maintain balance with use of cane.    Stairs            Wheelchair Mobility    Modified Rankin (Stroke Patients Only)       Balance Overall balance assessment: Needs assistance;History of Falls Sitting-balance support: No upper extremity supported;Feet supported Sitting balance-Leahy Scale: Good     Standing  balance support: Single extremity supported;During functional activity Standing balance-Leahy Scale: Poor                               Pertinent Vitals/Pain Pain Assessment: Faces Faces Pain Scale: Hurts little more Pain Location: Back Pain Descriptors / Indicators: Grimacing;Guarding Pain Intervention(s): Monitored during session;Premedicated before session;Repositioned    Home Living Family/patient expects to be discharged to:: Private residence Living Arrangements: Other relatives (Brother) Available Help at Discharge: Family;Available PRN/intermittently Type of Home: House Home Access: Stairs to enter Entrance Stairs-Rails: None Entrance Stairs-Number of Steps: 3 Home Layout: Two level;Able to live on main level with bedroom/bathroom Home Equipment: Gilford Rile - 2 wheels;Cane - single point      Prior Function Level of Independence: Independent with assistive device(s)               Hand Dominance   Dominant Hand: Right    Extremity/Trunk Assessment   Upper Extremity Assessment Upper Extremity Assessment: Generalized weakness    Lower Extremity Assessment Lower Extremity Assessment: Generalized weakness    Cervical / Trunk Assessment Cervical / Trunk Assessment: Kyphotic  Communication   Communication: No difficulties  Cognition Arousal/Alertness: Awake/alert Behavior During Therapy: WFL for tasks assessed/performed Overall Cognitive Status: Within Functional Limits for tasks assessed                      General Comments  Exercises     Assessment/Plan    PT Assessment Patient needs continued PT services  PT Problem List Decreased strength;Decreased activity tolerance;Decreased balance;Decreased mobility;Decreased coordination;Decreased knowledge of use of DME;Pain          PT Treatment Interventions DME instruction;Gait training;Stair training;Functional mobility training;Therapeutic activities;Therapeutic  exercise;Balance training;Patient/family education    PT Goals (Current goals can be found in the Care Plan section)  Acute Rehab PT Goals Patient Stated Goal: Get stronger PT Goal Formulation: With patient Time For Goal Achievement: 09/24/16 Potential to Achieve Goals: Good    Frequency Min 3X/week   Barriers to discharge        Co-evaluation               End of Session Equipment Utilized During Treatment: Gait belt Activity Tolerance: Patient limited by fatigue Patient left: in chair;with call bell/phone within reach;with chair alarm set Nurse Communication: Mobility status    Functional Assessment Tool Used: Clinical Judgement Functional Limitation: Mobility: Walking and moving around Mobility: Walking and Moving Around Current Status [phone]): At least 1 percent but less than 20 percent impaired, limited or restricted Mobility: Walking and Moving Around Goal Status [phone]): 0 percent impaired, limited or restricted    Time: 1337-1406 PT Time Calculation (min) (ACUTE ONLY): 29 min   Charges:   PT Evaluation $PT Eval Moderate Complexity: 1 Procedure PT Treatments $Gait Training: 8-22 mins   PT G Codes:   PT G-Codes **NOT FOR INPATIENT CLASS** Functional Assessment Tool Used: Clinical Judgement Functional Limitation: Mobility: Walking and moving around Mobility: Walking and Moving Around Current Status ([id number]): At least 1 percent but less than 20 percent impaired, limited or restricted Mobility: Walking and Moving Around Goal Status [phone]): 0 percent impaired, limited or restricted    Catarina Hartshorn, PT  [phone] 09/10/2016, 2:42 PM

## 2016-09-11 ENCOUNTER — Inpatient Hospital Stay (HOSPITAL_COMMUNITY): Payer: Medicare Other

## 2016-09-11 ENCOUNTER — Encounter (HOSPITAL_COMMUNITY): Payer: Self-pay | Admitting: *Deleted

## 2016-09-11 ENCOUNTER — Telehealth: Payer: Self-pay | Admitting: General Practice

## 2016-09-11 ENCOUNTER — Encounter (HOSPITAL_COMMUNITY)
Admission: EM | Disposition: A | Discharge status: Home / Self Care | Payer: Self-pay | Source: Home / Self Care | Attending: Internal Medicine

## 2016-09-11 DIAGNOSIS — R0609 Other forms of dyspnea: Secondary | ICD-10-CM

## 2016-09-11 DIAGNOSIS — I340 Nonrheumatic mitral (valve) insufficiency: Secondary | ICD-10-CM

## 2016-09-11 HISTORY — PX: TEE WITHOUT CARDIOVERSION: SHX5443

## 2016-09-11 LAB — RENAL FUNCTION PANEL
ANION GAP: 10 (ref 5–15)
Albumin: 3.1 g/dL — ABNORMAL LOW (ref 3.5–5.0)
BUN: 20 mg/dL (ref 6–20)
CHLORIDE: 99 mmol/L — AB (ref 101–111)
CO2: 29 mmol/L (ref 22–32)
CREATININE: 5.1 mg/dL — AB (ref 0.61–1.24)
Calcium: 9 mg/dL (ref 8.9–10.3)
GFR calc Af Amer: 13 mL/min — ABNORMAL LOW (ref >60)
GFR, EST NON AFRICAN AMERICAN: 11 mL/min — AB (ref >60)
Glucose, Bld: 101 mg/dL — ABNORMAL HIGH (ref 65–99)
POTASSIUM: 3.5 mmol/L (ref 3.5–5.1)
Phosphorus: 2 mg/dL — ABNORMAL LOW (ref 2.5–4.6)
Sodium: 138 mmol/L (ref 135–145)

## 2016-09-11 LAB — CBC
HEMATOCRIT: 23.3 % — AB (ref 39.0–52.0)
HEMOGLOBIN: 7.4 g/dL — AB (ref 13.0–17.0)
MCH: 29.7 pg (ref 26.0–34.0)
MCHC: 31.8 g/dL (ref 30.0–36.0)
MCV: 93.6 fL (ref 78.0–100.0)
Platelets: 153 10*3/uL (ref 150–400)
RBC: 2.49 MIL/uL — AB (ref 4.22–5.81)
RDW: 18 % — ABNORMAL HIGH (ref 11.5–15.5)
WBC: 8.2 10*3/uL (ref 4.0–10.5)

## 2016-09-11 LAB — ECHO TEE: AVPHT: 74 ms

## 2016-09-11 LAB — PREPARE RBC (CROSSMATCH)

## 2016-09-11 SURGERY — ECHOCARDIOGRAM, TRANSESOPHAGEAL
Anesthesia: Moderate Sedation

## 2016-09-11 MED ORDER — SODIUM CHLORIDE 0.9 % IV SOLN: Freq: Once | INTRAVENOUS | Status: DC

## 2016-09-11 MED ORDER — MIDAZOLAM HCL 10 MG/2ML IJ SOLN
INTRAMUSCULAR | Status: DC | PRN
  Administered 2016-09-11: 1 mg via INTRAVENOUS
  Administered 2016-09-11 (×3): 2 mg via INTRAVENOUS

## 2016-09-11 MED ORDER — SODIUM CHLORIDE 0.9 % IV SOLN: INTRAVENOUS | Status: DC

## 2016-09-11 MED ORDER — FENTANYL CITRATE (PF) 100 MCG/2ML IJ SOLN
INTRAMUSCULAR | Status: AC
  Filled 2016-09-11: qty 2

## 2016-09-11 MED ORDER — LORAZEPAM 0.5 MG PO TABS: 0.5000 mg | ORAL_TABLET | Freq: Once | ORAL | Status: AC | PRN

## 2016-09-11 MED ORDER — FENTANYL CITRATE (PF) 100 MCG/2ML IJ SOLN
INTRAMUSCULAR | Status: DC | PRN
  Administered 2016-09-11 (×2): 25 ug via INTRAVENOUS

## 2016-09-11 MED ORDER — BUTAMBEN-TETRACAINE-BENZOCAINE 2-2-14 % EX AERO
INHALATION_SPRAY | CUTANEOUS | Status: DC | PRN
  Administered 2016-09-11: 2 via TOPICAL

## 2016-09-11 MED ORDER — LORAZEPAM 2 MG/ML IJ SOLN
0.5000 mg | Freq: Once | INTRAMUSCULAR | Status: AC | PRN
  Administered 2016-09-12: 0.5 mg via INTRAMUSCULAR
  Filled 2016-09-11: qty 1

## 2016-09-11 MED ORDER — LAMIVUDINE 10 MG/ML PO SOLN
25.0000 mg | Freq: Every day | ORAL | Status: DC
  Administered 2016-09-11 – 2016-09-12 (×2): 25 mg via ORAL
  Filled 2016-09-11 (×2): qty 5

## 2016-09-11 MED ORDER — MIDAZOLAM HCL 5 MG/ML IJ SOLN
INTRAMUSCULAR | Status: AC
Start: 2016-09-11 — End: 2016-09-11
  Filled 2016-09-11: qty 2

## 2016-09-11 NOTE — Interval H&P Note (Signed)
History and Physical Interval Note:  09/11/2016 12:13 PM  Jeffrey [name]  has presented today for surgery, with the diagnosis of BACTEREMIA  The various methods of treatment have been discussed with the patient and family. After consideration of risks, benefits and other options for treatment, the patient has consented to  Procedure(s): TRANSESOPHAGEAL ECHOCARDIOGRAM (TEE) (N/A) as a surgical intervention .  The patient's history has been reviewed, patient examined, no change in status, stable for surgery.  I have reviewed the patient's chart and labs.  Questions were answered to the patient's satisfaction.     Ena Dawley

## 2016-09-11 NOTE — Progress Notes (Signed)
Internal Medicine Attending  Date: 09/11/2016  Patient name: Jeffrey [name] Medical record number: [id number] Date of birth: Oct 25, 1955 Age: 60 y.o. Gender: male  I saw and evaluated the patient. I discussed the assessment and plan with Dr. Charlynn [name] and his note is to follow with the details.  Jeffrey [name]'s dyspnea was improved when seen on rounds this morning. That being said, he was still volume overloaded and underwent an additional hemodialysis session today. TEE was unremarkable for evidence of endocarditis although his aortic insufficiency was graded as severe. Fortunately, he does not have left ventricular dilatation and his left ventricular ejection fraction is excellent. He therefore does not qualify for an aortic valve replacement at this time but will need a follow-up transthoracic echocardiogram in approximately one year to reassess for left ventricular dilatation or decrement in ejection fraction. With regards to his back pain, a limited MRI was without discitis but had to be aborted secondary to claustrophobia. We will obtain the rest of the MRI by pretreating his claustrophobia with a benzodiazepine just prior to the study. If his volume status is improved and his estimated dry weight is down graded to a target he can follow-up at home I anticipate he will be ready for discharge within the next 24 hours.

## 2016-09-11 NOTE — Telephone Encounter (Signed)
APT. REMINDER CALL, LMTCB °

## 2016-09-11 NOTE — H&P (View-Only) (Signed)
[facility] Progress Note   Subjective:  "I got a little anxious in that MRI-they took me out early".  Lying flat in bed, denies SOB at present. NPO for TEE. Concerned about EDW-thinks it should be lower (agree!). On O2 via [employer] at 2.5 liters.   Objective Vitals:   09/10/16 1254 09/10/16 1707 09/10/16 2111 09/11/16 0537  BP: (!) 177/106 (!) 118/38 (!) 160/69 100/69  Pulse: 81 86 74 80  Resp: 18 17 18 16   Temp: 99 F (37.2 C) 98.7 F (37.1 C) 99 F (37.2 C) 98.8 F (37.1 C)  TempSrc: Oral Oral Oral Oral  SpO2: 94% 97% 94% 99%  Weight:      Height:       Physical Exam General: WDWN, pleasant, NAD.  Heart:S1,S2, RRR 2/6  Lungs: BBS CTAB A/P. No WOB.  Abdomen: soft, non-tender Extremities: abrasion R knee from fall at home. Trace R pedal edema, no LLE edema Dialysis Access: LFA AVF + bruit   Additional Objective Labs: Basic Metabolic Panel:  Recent Labs Lab 09/10/16 0645 09/10/16 1551 09/11/16 0618  NA 137 138 138  K 3.3* 3.3* 3.5  CL 100* 98* 99*  CO2 28 30 29   GLUCOSE 120* 115* 101*  BUN 25* 10 20  CREATININE 6.09* 3.60* 5.10*  CALCIUM 8.8* 8.6* 9.0  PHOS  --  1.2* 2.0*   Liver Function Tests:  Recent Labs Lab 09/10/16 1551 09/11/16 0618  ALBUMIN 3.0* 3.1*   No results for input(s): LIPASE, AMYLASE in the last 168 hours. CBC:  Recent Labs Lab 09/09/16 0155 09/09/16 1856 09/10/16 0645 09/10/16 1551 09/11/16 0618  WBC 6.8 9.2 7.2 7.8 8.2  NEUTROABS 5.1  --   --   --   --   HGB 6.3* 8.2* 7.2* 7.4* 7.4*  HCT 19.9* 25.4* 22.3* 22.5* 23.3*  MCV 92.1 90.7 92.5 92.2 93.6  PLT 139* 174 146* 152 153   Blood Culture    Component Value Date/Time   SDES BLOOD RIGHT HAND 09/09/2016 1209   SPECREQUEST BOTTLES DRAWN AEROBIC ONLY  5CC 09/09/2016 1209   CULT NO GROWTH 1 DAY 09/09/2016 1209   REPTSTATUS PENDING 09/09/2016 1209    Cardiac Enzymes:  Recent Labs Lab 09/09/16 0155  TROPONINI 0.05*   CBG: No results for input(s): GLUCAP  in the last 168 hours. Iron Studies: No results for input(s): IRON, TIBC, TRANSFERRIN, FERRITIN in the last 72 hours. @lablastinr3 @ Studies/Results: Dg Chest 2 View  Result Date: 09/10/2016 CLINICAL DATA:  CHF. EXAM: CHEST  2 VIEW COMPARISON:  Radiographs yesterday FINDINGS: Improving lung aeration from prior. Decreasing pulmonary edema. Small pleural effusions, similar to prior. Cardiomegaly is unchanged allowing for differences in technique. No new airspace disease. No pneumothorax. IMPRESSION: Improving CHF with decreasing pulmonary edema and improving lung aeration. Small pleural effusions persist. Electronically Signed   By: Jeb Levering M.D.   On: 09/10/2016 18:59   Mr Lumbar Spine Wo Contrast  Result Date: 09/10/2016 CLINICAL DATA:  Bacteremia. EXAM: MRI LUMBAR SPINE WITHOUT CONTRAST TECHNIQUE: Multiplanar, multisequence MR imaging of the lumbar spine was performed. No intravenous contrast was administered. COMPARISON:  08/05/2016 FINDINGS: Only sagittal T1 and T2 imaging could be obtained before shortness of breath required termination of the scan. Segmentation:  Standard Alignment:  Normal Vertebrae: Abnormal hypointensity of the marrow space aside from small islands of fatty marrow diffusely. Suspect this is from anemia or renal osteodystrophy based on osseous findings on November 2017 abdominal CT. No acute fracture. Conus medullaris: Extends  to the L2 level and appears normal. Paraspinal and other soft tissues: No acute abnormality noted. Disc levels: T12- L1: Unremarkable. L1-L2: Mild disc narrowing and desiccation.  No impingement L2-L3: Unremarkable. L3-L4: Mild disc narrowing and desiccation. Mild facet spurring. No impingement L4-L5: Advanced disc narrowing with spurring and disc bulging. Central disc protrusion that is small. Disc T2 hyperintensity seen previously is diminished. Stable T1 appearance of the endplates which are difficult to visualize due to chronic marrow changes.  Disc narrowing and bulging with ridging causes moderate bilateral foraminal stenosis. Widely patent canal L5-S1:Degenerative disc narrowing with spurring and ridging. Mild bilateral foraminal stenosis. Widely patent canal. IMPRESSION: 1. Only 2 sequences could be obtained before shortness of breath required termination of the exam. 2. No overt discitis or change compared to August 05, 2016 comparison. When clinically able, would continue this study to further evaluate the L4-5 disc due to disc and psoas T2 hyperintensity noted on the previous study (findings seen with discitis or dialysis related spondyloarthropathy). 3. L4-5 moderate bilateral foraminal stenosis. 4. Abnormal marrow, favor anemia or renal osteodystrophy. Marrow infiltration is not excluded. Electronically Signed   By: Monte Fantasia M.D.   On: 09/10/2016 17:08   Medications:  . amLODipine  5 mg Oral BID  . calcium acetate  1,334 mg Oral TID WC   And  . calcium acetate  667-1,334 mg Oral With snacks  . carvedilol  25 mg Oral BID WC  . feeding supplement (NEPRO CARB STEADY)  237 mL Oral BID BM  . hydrALAZINE  100 mg Oral TID  . lamiVUDine  25 mg Oral Daily  . mouth rinse  15 mL Mouth Rinse BID  . multivitamin  1 tablet Oral Daily  . raltegravir  400 mg Oral BID  . [START ON 09/12/2016] tenofovir  300 mg Oral Q Sat     Dialysis Orders:  Home Hemodialysis (Nx stage) 3-4x/week, EDW 85kg, L AVF - Heparin 6000 unit bolus q HD - ESA: Last given Mircera 268mcg subq on 12/18 - No IV iron (last tsat 32% 12/13)  Assessment/Plan: 1.  Pulmonary edema/Volume overload: Will plan on HD to help volume correction. Had HD 09/10/16, Net UF 2.5 liters. Cxr 09/10/16 shows improving CHF with decreased pulmonary edema.  2.  ESRD: HHD 3-4x/week. Plan HD today after TEE. Recheck HGB, transfuse if needed. 4.0 K bath.  3.  Hypertension/volume: BP high with volume excess. EDW will need to be lowered on d/c. SBP varying widely. Has resumed  amlodipine, carvedilol, hydralazine. Still has R pedal edema.  4.  Anemia: Hgb 6.3, 1U PRBCs given 12/20. Just dosed with ESA 2 days ago, none ordered for now. Denies GI losses, but would check guaiacs to be sure. HGB 7.4 today.  5.  Metabolic bone disease: Ca ok, continue binders. Will check Phos tomorrow. 6.  HIV: On HAART; per primary. 7. Recent MSSA bacteremia: This was not related to catheter, he has only had AVF for the past few years. There was question of possible aortic valve endocarditis. Off abx currently. Surveillance BCx  drawn per primary-negative X 1 day. ID consulted, recommended MRI of spine to R/O discitis/epidural abscess. MRI partially completed before terminated for C/O SOB. No overt discitis or change compared to August 05, 2016 comparison.  For TEE today.   Rita H. Brown NP-C 09/11/2016, 9:21 AM  [facility] [phone]  Renal Attending: I agree with note above.  He still has significant fluid on CXR, but improved and he remains on  oxygen.  Will plan for HD again after TEE today. Reta Norgren C

## 2016-09-11 NOTE — Progress Notes (Signed)
PT Cancellation Note  Patient Details Name: Jeffrey [name] MRN: [id number] DOB: 11-08-1955   Cancelled Treatment:    Reason Eval/Treat Not Completed: Patient at procedure or test/unavailable; will attempt later if time permits, or another day.   Reginia Naas 09/11/2016, 2:04 PM  Magda Kiel, [place] 09/11/2016

## 2016-09-11 NOTE — Progress Notes (Signed)
Subjective: No new complaints   Antibiotics:  Anti-infectives    Start     Dose/Rate Route Frequency Ordered Stop   09/12/16 1000  tenofovir (VIREAD) tablet 300 mg     300 mg Oral Every Sat 09/09/16 0747     09/09/16 1100  raltegravir (ISENTRESS) tablet 400 mg     400 mg Oral 2 times daily 09/09/16 0747     09/09/16 1100  lamiVUDine (EPIVIR) 10 MG/ML solution 25 mg     25 mg Oral Daily 09/09/16 1043     09/09/16 1000  lamivudine (EPIVIR-HBV) 5 MG/ML solution 25 mg  Status:  Discontinued     25 mg Oral Daily 09/09/16 0747 09/09/16 1043      Medications: Scheduled Meds: . amLODipine  5 mg Oral BID  . calcium acetate  1,334 mg Oral TID WC   And  . calcium acetate  667-1,334 mg Oral With snacks  . carvedilol  25 mg Oral BID WC  . feeding supplement (NEPRO CARB STEADY)  237 mL Oral BID BM  . hydrALAZINE  100 mg Oral TID  . lamiVUDine  25 mg Oral Daily  . mouth rinse  15 mL Mouth Rinse BID  . multivitamin  1 tablet Oral Daily  . raltegravir  400 mg Oral BID  . [START ON 09/12/2016] tenofovir  300 mg Oral Q Sat   Continuous Infusions: PRN Meds:.sodium chloride, sodium chloride, albuterol, lidocaine (PF), lidocaine-prilocaine, LORazepam **OR** LORazepam, oxyCODONE-acetaminophen, pentafluoroprop-tetrafluoroeth, traMADol, traZODone    Objective: Weight change: -1 lb 15.8 oz (-0.9 kg)  Intake/Output Summary (Last 24 hours) at 09/11/16 1530 Last data filed at 09/11/16 0900  Gross per 24 hour  Intake              360 ml  Output                0 ml  Net              360 ml   Blood pressure (!) (P) 216/97, pulse (P) 87, temperature 98.4 F (36.9 C), temperature source Axillary, resp. rate (P) 18, height 6' (1.829 m), weight 182 lb 15.7 oz (83 kg), SpO2 99 %. Temp:  [98.4 F (36.9 C)-99.3 F (37.4 C)] 98.4 F (36.9 C) (12/22 1345) Pulse Rate:  [74-87] (P) 87 (12/22 1500) Resp:  [16-29] (P) 18 (12/22 1500) BP: (100-216)/(38-104) (P) 216/97 (12/22 1500) SpO2:  [94  %-100 %] 99 % (12/22 1305)  Physical Exam: General:Patient was initially asleep when I came to visit him but he was easily arousable HEENT: anicteric sclera,  EOMI, oropharynx clear and without exudate Cardiovascular: regular rate, normal r,  no murmur rubs or gallops Pulmonary: clear to auscultation bilaterally, no wheezing, rales or rhonchi Gastrointestinal: soft nontender, nondistended, normal bowel sounds, Musculoskeletal: no  clubbing or edema noted bilaterally Skin, soft tissue: no rashesm, AV site not overtly infected Neuro: nonfocal, strength and sensation intact   CBC:  CBC Latest Ref Rng & Units 09/11/2016 09/10/2016 09/10/2016  WBC 4.0 - 10.5 K/uL 8.2 7.8 7.2  Hemoglobin 13.0 - 17.0 g/dL 7.4(L) 7.4(L) 7.2(L)  Hematocrit 39.0 - 52.0 % 23.3(L) 22.5(L) 22.3(L)  Platelets 150 - 400 K/uL 153 152 146(L)      BMET  Recent Labs  09/10/16 1551 09/11/16 0618  NA 138 138  K 3.3* 3.5  CL 98* 99*  CO2 30 29  GLUCOSE 115* 101*  BUN 10 20  CREATININE 3.60* 5.10*  CALCIUM 8.6* 9.0     Liver Panel   Recent Labs  09/10/16 1551 09/11/16 0618  ALBUMIN 3.0* 3.1*       Sedimentation Rate No results for input(s): ESRSEDRATE in the last 72 hours. C-Reactive Protein No results for input(s): CRP in the last 72 hours.  Micro Results: Recent Results (from the past 720 hour(s))  Blood culture (routine x 2)     Status: None (Preliminary result)   Collection Time: 09/09/16 12:01 PM  Result Value Ref Range Status   Specimen Description BLOOD RIGHT HAND  Final   Special Requests BOTTLES DRAWN AEROBIC AND ANAEROBIC  5CC  Final   Culture NO GROWTH 2 DAYS  Final   Report Status PENDING  Incomplete  Blood culture (routine x 2)     Status: None (Preliminary result)   Collection Time: 09/09/16 12:09 PM  Result Value Ref Range Status   Specimen Description BLOOD RIGHT HAND  Final   Special Requests BOTTLES DRAWN AEROBIC ONLY  5CC  Final   Culture NO GROWTH 2 DAYS  Final     Report Status PENDING  Incomplete  MRSA PCR Screening     Status: None   Collection Time: 09/09/16 10:21 PM  Result Value Ref Range Status   MRSA by PCR NEGATIVE NEGATIVE Final    Comment:        The GeneXpert MRSA Assay (FDA approved for NASAL specimens only), is one component of a comprehensive MRSA colonization surveillance program. It is not intended to diagnose MRSA infection nor to guide or monitor treatment for MRSA infections.     Studies/Results: Dg Chest 2 View  Result Date: 09/10/2016 CLINICAL DATA:  CHF. EXAM: CHEST  2 VIEW COMPARISON:  Radiographs yesterday FINDINGS: Improving lung aeration from prior. Decreasing pulmonary edema. Small pleural effusions, similar to prior. Cardiomegaly is unchanged allowing for differences in technique. No new airspace disease. No pneumothorax. IMPRESSION: Improving CHF with decreasing pulmonary edema and improving lung aeration. Small pleural effusions persist. Electronically Signed   By: Jeb Levering M.D.   On: 09/10/2016 18:59   Mr Lumbar Spine Wo Contrast  Result Date: 09/10/2016 CLINICAL DATA:  Bacteremia. EXAM: MRI LUMBAR SPINE WITHOUT CONTRAST TECHNIQUE: Multiplanar, multisequence MR imaging of the lumbar spine was performed. No intravenous contrast was administered. COMPARISON:  08/05/2016 FINDINGS: Only sagittal T1 and T2 imaging could be obtained before shortness of breath required termination of the scan. Segmentation:  Standard Alignment:  Normal Vertebrae: Abnormal hypointensity of the marrow space aside from small islands of fatty marrow diffusely. Suspect this is from anemia or renal osteodystrophy based on osseous findings on November 2017 abdominal CT. No acute fracture. Conus medullaris: Extends to the L2 level and appears normal. Paraspinal and other soft tissues: No acute abnormality noted. Disc levels: T12- L1: Unremarkable. L1-L2: Mild disc narrowing and desiccation.  No impingement L2-L3: Unremarkable. L3-L4: Mild  disc narrowing and desiccation. Mild facet spurring. No impingement L4-L5: Advanced disc narrowing with spurring and disc bulging. Central disc protrusion that is small. Disc T2 hyperintensity seen previously is diminished. Stable T1 appearance of the endplates which are difficult to visualize due to chronic marrow changes. Disc narrowing and bulging with ridging causes moderate bilateral foraminal stenosis. Widely patent canal L5-S1:Degenerative disc narrowing with spurring and ridging. Mild bilateral foraminal stenosis. Widely patent canal. IMPRESSION: 1. Only 2 sequences could be obtained before shortness of breath required termination of the exam. 2. No overt discitis or change compared to August 05, 2016 comparison. When clinically  able, would continue this study to further evaluate the L4-5 disc due to disc and psoas T2 hyperintensity noted on the previous study (findings seen with discitis or dialysis related spondyloarthropathy). 3. L4-5 moderate bilateral foraminal stenosis. 4. Abnormal marrow, favor anemia or renal osteodystrophy. Marrow infiltration is not excluded. Electronically Signed   By: Monte Fantasia M.D.   On: 09/10/2016 17:08      Assessment/Plan:  INTERVAL HISTORY:  Last back showing viral load is undetectable CD4 count is risen transesophageal echocardiogram without vegetation MRI though was not completed   Active Problems:   Human immunodeficiency virus (HIV) disease (HCC)   End stage renal disease on home HD   HBV (hepatitis B virus) infection   COPD (chronic obstructive pulmonary disease) (HCC)   Anemia of chronic disease   Essential hypertension   Lumbosacral spondylosis without myelopathy   Pulmonary nodules   Fall   Symptomatic anemia   Bilateral low back pain without sciatica   Coagulase negative Staphylococcus bacteremia   Aortic valve regurgitation   AIDS ([place])   Hypervolemia   Nonrheumatic aortic valve insufficiency   Chronic bilateral low back pain  without sciatica    Jeffrey [name] is a 60 y.o. male with  with HIV disease perfect controlled on regimen of ISENTRESS twice daily Epivir once daily and Viread once weekly with end-stage renal disease on hemodialysis who was admitted with coagulase negative methicillin sensitive staph aureus bacteremia in November. There was concern about whether he might have aortic valve endocarditis due to worsening aortic valve insufficiency on transthoracic echocardiogram. He is now had transesophageal echocardiogram that shows no evidence for vegetations. He also had severe back pain to time though MRI did not show discitis he has had a repeat MRI though which is not been able to be completed due to the patient's anxiety and claustrophobia.  #1 HIV disease perfectly controlled continue current regimen, drop in CD4 count during his admit admission was due to an acute illness with his bacteremia  #2 rule out lumbar discitis: Would try to get the full MRI study with anxiolytics to exclude lumbar discitis.  #3 recent coag negative staphylococcal bacteremia. His transesophageal echo card Phillip Heal is negative. If he has no evidence of deep infections as a discitis then he has received adequate treatment.  I will follow-up the results of his MRI but otherwise follow peripherally. If MRI is negative I will sign off   LOS: 2 days   Alcide Evener 09/11/2016, 3:30 PM

## 2016-09-11 NOTE — Progress Notes (Signed)
Subjective:  Doing well this morning on rounds. Reports being very nervous about his TEE today. Says he was up all night anxious about it. Reports having anxiety during his MRI yesterday that made him cut it short. Feeling improved but still having some shortness of breath. No chest pain. Believes edema is improving.   Objective:  Vital signs in last 24 hours: Vitals:   09/11/16 1730 09/11/16 1752 09/11/16 1901 09/11/16 2119  BP: (!) 237/109 (!) 216/104 119/83 99/64  Pulse: 77 79 78 79  Resp:  20 18 16   Temp:  99 F (37.2 C) 99.6 F (37.6 C) 98.3 F (36.8 C)  TempSrc:  Oral Oral Oral  SpO2:   95% 95%  Weight:      Height:       Physical Exam Constitutional: NAD, appears comfortable Cardiovascular: RRR, 3/6 systolic murmur Pulmonary/Chest: Bibasilar crackles  Abdominal: Soft, non tender, non distended. +BS.  Extremities: Warm and well perfused. Distal pulses intact. No edema. LUE AVF, Right knee abrasion  Neurological: A&Ox3, CN II - XII grossly intact.  Skin: No rashes or erythema  Psychiatric: Normal mood and affect  Assessment/Plan:  SOB: Likely multifactorial due to acute on chronic anemia (baseline 7-8) and volume overload. S/p 1 unit of pRBCs and HD on admission with improvement in symptoms. Post transfusion hemoglobin with good response, 6.3 -> 8.2, down to 7.4 today. No active signs of bleeding. Patient is ESRD on ESA therapy, not receiving iron supplementation. It is possible that patient is iron deficient however given his blood transfusion, iron panel would likely be inaccurate at this point. Another consideration would be his worsening aortic insufficiency seen on TEE. Hemolysis is definitely a possibility. -- Daily CBC -- Transfuse prn to maintain hgb > 7.0  -- Mircera per nephrology   ESRD on HD: Reports compliance with home HD, usually dialyzes 3-4 times a week. Patient reports he has lost weight recently and believes he needs a new dry weight. Volume up on  admission. Had dialysis on admission, planning another HD session today. SOB significantly improved.  -- Nephrology following, appreciate recs   -- HD today  -- Last Mircera on 12/18  -- Continue Phoslo  Hx of MSSA Bacteremia: Hospitalized 11/13 - 11/18 for MSSA bacteremia, unclear source. Discharge on Ancef for 2 weeks. Echo was performed on the day of discharge and resulted with moderate AI (vegetation cannot be excluded) worse from prior echo 4 months ago. Patient was suppose to follow up with cards for an outpatient TEE and extend his antibiotic course for a total of 6 weeks, however patient says he was never called with these results and was unaware. He completed the two weeks and has been asymptomatic until he developed this episode of SOB. Patient is currently afebrile and hemodynamically stable.  TEE with no evidence of vegetations, EF good. Was notable of severe aortic insufficiency. Fortunately, he does not have left ventricular dilatation and his left ventricular ejection fraction is excellent. He therefore does not qualify for an aortic valve replacement at this time but will need a follow-up transthoracic echocardiogram in approximately one year to reassess for left ventricular dilatation or decrement in ejection fraction -- Repeat blood cultures NGTD  HIV AIDs: Patient follows with Dr. Johnnye Sima and reports compliance with his ART (tenofovir, raltegravir, lamivudine). CD4 count in January was 520 but appears falling, 160 during his last hospitalization in November. CD4 count 250 and HIV viral load <20 this hospitalization. Patient was suppose to follow up  with ID in August but per chart review appears he missed his appointment. -- ID consult, appreciate recommendations  -- Continue ART (tenofovir, raltegravir, lamivudine)  Back Pain: MRI from his prior hospitalization with severe bilateral neural foraminal stenosis at L4-L5 and L5-S1. He underwent epidural injection on the R L4-5 on 12/5  with some improvement. Getting Lumbar MRI -- unable to tolerate yesterday due to anxiety. Giving anxiolytic with exam today.  -- PT/OT -- Ice/heat  -- Continue home percocet 1 mg q6 hours prn   HTN: Normotensive on home regimen  -- Continue home Coreg 25 mg BID, amlodipine 5 mg daily, and hydralazine 1000 mg TID   COPD: -- Continue albuterol   Lung nodules: Found incidentally on CT renal stone study during his last admission. Multiple nodules measuring up to 8 mm noted at his bases.  -- Follow up CT chest 3-6 months  FEN: No fluids, hemodialysis, renal diet VTE ppx: SCDs Code Status: FULL   Dispo: Anticipated discharge in the morning  Maryellen Pile, MD 09/11/2016, 10:03 PM Pager: [phone]

## 2016-09-11 NOTE — CV Procedure (Signed)
     Transesophageal Echocardiogram Note  Jeffrey [name] [id number] 1956-01-18  Procedure: Transesophageal Echocardiogram Indications: MSSA bacteremia  Procedure Details Consent: Obtained Time Out: Verified patient identification, verified procedure, site/side was marked, verified correct patient position, special equipment/implants available, Radiology Safety Procedures followed,  medications/allergies/relevent history reviewed, required imaging and test results available.  Performed  Medications: Fentanyl: 50 mcg Versed: 7 mg  Normal LVEF. Severe AI. No endocarditis.  Complications: No apparent complications Patient did tolerate procedure well.  Ena Dawley, MD, [facility] 09/11/2016, 1:03 PM

## 2016-09-11 NOTE — Progress Notes (Signed)
[facility] Progress Note   Subjective:  "I got a little anxious in that MRI-they took me out early".  Lying flat in bed, denies SOB at present. NPO for TEE. Concerned about EDW-thinks it should be lower (agree!). On O2 via [place] at 2.5 liters.   Objective Vitals:   09/10/16 1254 09/10/16 1707 09/10/16 2111 09/11/16 0537  BP: (!) 177/106 (!) 118/38 (!) 160/69 100/69  Pulse: 81 86 74 80  Resp: 18 17 18 16   Temp: 99 F (37.2 C) 98.7 F (37.1 C) 99 F (37.2 C) 98.8 F (37.1 C)  TempSrc: Oral Oral Oral Oral  SpO2: 94% 97% 94% 99%  Weight:      Height:       Physical Exam General: WDWN, pleasant, NAD.  Heart:S1,S2, RRR 2/6  Lungs: BBS CTAB A/P. No WOB.  Abdomen: soft, non-tender Extremities: abrasion R knee from fall at home. Trace R pedal edema, no LLE edema Dialysis Access: LFA AVF + bruit   Additional Objective Labs: Basic Metabolic Panel:  Recent Labs Lab 09/10/16 0645 09/10/16 1551 09/11/16 0618  NA 137 138 138  K 3.3* 3.3* 3.5  CL 100* 98* 99*  CO2 28 30 29   GLUCOSE 120* 115* 101*  BUN 25* 10 20  CREATININE 6.09* 3.60* 5.10*  CALCIUM 8.8* 8.6* 9.0  PHOS  --  1.2* 2.0*   Liver Function Tests:  Recent Labs Lab 09/10/16 1551 09/11/16 0618  ALBUMIN 3.0* 3.1*   No results for input(s): LIPASE, AMYLASE in the last 168 hours. CBC:  Recent Labs Lab 09/09/16 0155 09/09/16 1856 09/10/16 0645 09/10/16 1551 09/11/16 0618  WBC 6.8 9.2 7.2 7.8 8.2  NEUTROABS 5.1  --   --   --   --   HGB 6.3* 8.2* 7.2* 7.4* 7.4*  HCT 19.9* 25.4* 22.3* 22.5* 23.3*  MCV 92.1 90.7 92.5 92.2 93.6  PLT 139* 174 146* 152 153   Blood Culture    Component Value Date/Time   SDES BLOOD RIGHT HAND 09/09/2016 1209   SPECREQUEST BOTTLES DRAWN AEROBIC ONLY  5CC 09/09/2016 1209   CULT NO GROWTH 1 DAY 09/09/2016 1209   REPTSTATUS PENDING 09/09/2016 1209    Cardiac Enzymes:  Recent Labs Lab 09/09/16 0155  TROPONINI 0.05*   CBG: No results for input(s): GLUCAP  in the last 168 hours. Iron Studies: No results for input(s): IRON, TIBC, TRANSFERRIN, FERRITIN in the last 72 hours. @lablastinr3 @ Studies/Results: Dg Chest 2 View  Result Date: 09/10/2016 CLINICAL DATA:  CHF. EXAM: CHEST  2 VIEW COMPARISON:  Radiographs yesterday FINDINGS: Improving lung aeration from prior. Decreasing pulmonary edema. Small pleural effusions, similar to prior. Cardiomegaly is unchanged allowing for differences in technique. No new airspace disease. No pneumothorax. IMPRESSION: Improving CHF with decreasing pulmonary edema and improving lung aeration. Small pleural effusions persist. Electronically Signed   By: Jeb Levering M.D.   On: 09/10/2016 18:59   Mr Lumbar Spine Wo Contrast  Result Date: 09/10/2016 CLINICAL DATA:  Bacteremia. EXAM: MRI LUMBAR SPINE WITHOUT CONTRAST TECHNIQUE: Multiplanar, multisequence MR imaging of the lumbar spine was performed. No intravenous contrast was administered. COMPARISON:  08/05/2016 FINDINGS: Only sagittal T1 and T2 imaging could be obtained before shortness of breath required termination of the scan. Segmentation:  Standard Alignment:  Normal Vertebrae: Abnormal hypointensity of the marrow space aside from small islands of fatty marrow diffusely. Suspect this is from anemia or renal osteodystrophy based on osseous findings on November 2017 abdominal CT. No acute fracture. Conus medullaris: Extends  to the L2 level and appears normal. Paraspinal and other soft tissues: No acute abnormality noted. Disc levels: T12- L1: Unremarkable. L1-L2: Mild disc narrowing and desiccation.  No impingement L2-L3: Unremarkable. L3-L4: Mild disc narrowing and desiccation. Mild facet spurring. No impingement L4-L5: Advanced disc narrowing with spurring and disc bulging. Central disc protrusion that is small. Disc T2 hyperintensity seen previously is diminished. Stable T1 appearance of the endplates which are difficult to visualize due to chronic marrow changes.  Disc narrowing and bulging with ridging causes moderate bilateral foraminal stenosis. Widely patent canal L5-S1:Degenerative disc narrowing with spurring and ridging. Mild bilateral foraminal stenosis. Widely patent canal. IMPRESSION: 1. Only 2 sequences could be obtained before shortness of breath required termination of the exam. 2. No overt discitis or change compared to August 05, 2016 comparison. When clinically able, would continue this study to further evaluate the L4-5 disc due to disc and psoas T2 hyperintensity noted on the previous study (findings seen with discitis or dialysis related spondyloarthropathy). 3. L4-5 moderate bilateral foraminal stenosis. 4. Abnormal marrow, favor anemia or renal osteodystrophy. Marrow infiltration is not excluded. Electronically Signed   By: Monte Fantasia M.D.   On: 09/10/2016 17:08   Medications:  . amLODipine  5 mg Oral BID  . calcium acetate  1,334 mg Oral TID WC   And  . calcium acetate  667-1,334 mg Oral With snacks  . carvedilol  25 mg Oral BID WC  . feeding supplement (NEPRO CARB STEADY)  237 mL Oral BID BM  . hydrALAZINE  100 mg Oral TID  . lamiVUDine  25 mg Oral Daily  . mouth rinse  15 mL Mouth Rinse BID  . multivitamin  1 tablet Oral Daily  . raltegravir  400 mg Oral BID  . [START ON 09/12/2016] tenofovir  300 mg Oral Q Sat     Dialysis Orders:  Home Hemodialysis (Nx stage) 3-4x/week, EDW 85kg, L AVF - Heparin 6000 unit bolus q HD - ESA: Last given Mircera 259mcg subq on 12/18 - No IV iron (last tsat 32% 12/13)  Assessment/Plan: 1.  Pulmonary edema/Volume overload: Will plan on HD to help volume correction. Had HD 09/10/16, Net UF 2.5 liters. Cxr 09/10/16 shows improving CHF with decreased pulmonary edema.  2.  ESRD: HHD 3-4x/week. Plan HD today after TEE. Recheck HGB, transfuse if needed. 4.0 K bath.  3.  Hypertension/volume: BP high with volume excess. EDW will need to be lowered on d/c. SBP varying widely. Has resumed  amlodipine, carvedilol, hydralazine. Still has R pedal edema.  4.  Anemia: Hgb 6.3, 1U PRBCs given 12/20. Just dosed with ESA 2 days ago, none ordered for now. Denies GI losses, but would check guaiacs to be sure. HGB 7.4 today.  5.  Metabolic bone disease: Ca ok, continue binders. Will check Phos tomorrow. 6.  HIV: On HAART; per primary. 7. Recent MSSA bacteremia: This was not related to catheter, he has only had AVF for the past few years. There was question of possible aortic valve endocarditis. Off abx currently. Surveillance BCx  drawn per primary-negative X 1 day. ID consulted, recommended MRI of spine to R/O discitis/epidural abscess. MRI partially completed before terminated for C/O SOB. No overt discitis or change compared to August 05, 2016 comparison.  For TEE today.   Rita H. Brown NP-C 09/11/2016, 9:21 AM  [facility] [phone]  Renal Attending: I agree with note above.  He still has significant fluid on CXR, but improved and he remains on  oxygen.  Will plan for HD again after TEE today. Laiza Veenstra C

## 2016-09-12 ENCOUNTER — Inpatient Hospital Stay (HOSPITAL_COMMUNITY): Payer: Medicare Other

## 2016-09-12 DIAGNOSIS — Z21 Asymptomatic human immunodeficiency virus [HIV] infection status: Secondary | ICD-10-CM

## 2016-09-12 DIAGNOSIS — Z880 Allergy status to penicillin: Secondary | ICD-10-CM

## 2016-09-12 DIAGNOSIS — G8929 Other chronic pain: Secondary | ICD-10-CM

## 2016-09-12 DIAGNOSIS — M545 Low back pain: Secondary | ICD-10-CM

## 2016-09-12 DIAGNOSIS — Z992 Dependence on renal dialysis: Secondary | ICD-10-CM

## 2016-09-12 DIAGNOSIS — Z888 Allergy status to other drugs, medicaments and biological substances status: Secondary | ICD-10-CM

## 2016-09-12 DIAGNOSIS — I351 Nonrheumatic aortic (valve) insufficiency: Secondary | ICD-10-CM

## 2016-09-12 DIAGNOSIS — R0602 Shortness of breath: Secondary | ICD-10-CM

## 2016-09-12 DIAGNOSIS — N186 End stage renal disease: Secondary | ICD-10-CM

## 2016-09-12 LAB — TYPE AND SCREEN
ABO/RH(D): O NEG
ANTIBODY SCREEN: NEGATIVE
UNIT DIVISION: 0
Unit division: 0

## 2016-09-12 LAB — CBC
HCT: 24.8 % — ABNORMAL LOW (ref 39.0–52.0)
Hemoglobin: 8 g/dL — ABNORMAL LOW (ref 13.0–17.0)
MCH: 29.4 pg (ref 26.0–34.0)
MCHC: 32.3 g/dL (ref 30.0–36.0)
MCV: 91.2 fL (ref 78.0–100.0)
PLATELETS: 166 10*3/uL (ref 150–400)
RBC: 2.72 MIL/uL — ABNORMAL LOW (ref 4.22–5.81)
RDW: 18.3 % — AB (ref 11.5–15.5)
WBC: 8.1 10*3/uL (ref 4.0–10.5)

## 2016-09-12 LAB — RENAL FUNCTION PANEL
Albumin: 3.1 g/dL — ABNORMAL LOW (ref 3.5–5.0)
Anion gap: 8 (ref 5–15)
BUN: 22 mg/dL — AB (ref 6–20)
CO2: 31 mmol/L (ref 22–32)
CREATININE: 4.27 mg/dL — AB (ref 0.61–1.24)
Calcium: 9.1 mg/dL (ref 8.9–10.3)
Chloride: 96 mmol/L — ABNORMAL LOW (ref 101–111)
GFR calc Af Amer: 16 mL/min — ABNORMAL LOW (ref >60)
GFR calc non Af Amer: 14 mL/min — ABNORMAL LOW (ref >60)
Glucose, Bld: 95 mg/dL (ref 65–99)
Phosphorus: 1.3 mg/dL — ABNORMAL LOW (ref 2.5–4.6)
Potassium: 3.5 mmol/L (ref 3.5–5.1)
Sodium: 135 mmol/L (ref 135–145)

## 2016-09-12 MED ORDER — ALTEPLASE 2 MG IJ SOLR: 2.0000 mg | Freq: Once | INTRAMUSCULAR | Status: DC | PRN

## 2016-09-12 MED ORDER — OXYCODONE-ACETAMINOPHEN 5-325 MG PO TABS: 1.0000 | ORAL_TABLET | Freq: Three times a day (TID) | ORAL | 0 refills | Status: DC | PRN

## 2016-09-12 MED ORDER — OXYCODONE-ACETAMINOPHEN 5-325 MG PO TABS
1.0000 | ORAL_TABLET | ORAL | Status: DC | PRN
  Administered 2016-09-12: 1 via ORAL
  Filled 2016-09-12: qty 1

## 2016-09-12 MED ORDER — LORAZEPAM 2 MG/ML IJ SOLN
INTRAMUSCULAR | Status: AC
  Administered 2016-09-12: 1 mg via INTRAMUSCULAR
  Filled 2016-09-12: qty 1

## 2016-09-12 MED ORDER — LORAZEPAM 2 MG/ML IJ SOLN
1.0000 mg | Freq: Once | INTRAMUSCULAR | Status: AC
  Administered 2016-09-12: 1 mg via INTRAMUSCULAR

## 2016-09-12 MED ORDER — OXYCODONE-ACETAMINOPHEN 5-325 MG PO TABS: 1.0000 | ORAL_TABLET | Freq: Four times a day (QID) | ORAL | 0 refills | Status: DC | PRN

## 2016-09-12 MED ORDER — SENNOSIDES-DOCUSATE SODIUM 8.6-50 MG PO TABS
1.0000 | ORAL_TABLET | Freq: Once | ORAL | Status: AC
Start: 2016-09-12 — End: 2016-09-12
  Administered 2016-09-12: 1 via ORAL
  Filled 2016-09-12: qty 1

## 2016-09-12 MED ORDER — SODIUM CHLORIDE 0.9 % IV SOLN: 100.0000 mL | INTRAVENOUS | Status: DC | PRN

## 2016-09-12 MED ORDER — CALCIUM ACETATE (PHOS BINDER) 667 MG PO CAPS: 667.0000 mg | ORAL_CAPSULE | Freq: Three times a day (TID) | ORAL | Status: DC

## 2016-09-12 NOTE — Progress Notes (Signed)
Subjective: Jeffrey [name] was seen and evaluated today at bedside. Reports his SOB has improved completely since admission however continues to complain of chronic low back pain and feeling anxious about the MRI. Pt reports that his pain has improved with steroid injections however requests pain medication to tide him over until he can receive one as an outpatient. Awaiting MRI results, patient initially refused imaging this morning.   Objective:  Vital signs in last 24 hours: Vitals:   09/11/16 2119 09/11/16 2244 09/12/16 0515 09/12/16 0800  BP: 99/64 107/77 (!) 197/63 (!) 162/71  Pulse: 79 81 75 73  Resp: 16  16 18   Temp: 98.3 F (36.8 C)  98.4 F (36.9 C) 98.5 F (36.9 C)  TempSrc: Oral  Oral Oral  SpO2: 95%  99% 98%  Weight:      Height:       CBC Latest Ref Rng & Units 09/12/2016 09/11/2016 09/10/2016  WBC 4.0 - 10.5 K/uL 8.1 8.2 7.8  Hemoglobin 13.0 - 17.0 g/dL 8.0(L) 7.4(L) 7.4(L)  Hematocrit 39.0 - 52.0 % 24.8(L) 23.3(L) 22.5(L)  Platelets 150 - 400 K/uL 166 153 152   CMP Latest Ref Rng & Units 09/12/2016 09/11/2016 09/10/2016  Glucose 65 - 99 mg/dL 95 101(H) 115(H)  BUN 6 - 20 mg/dL 22(H) 20 10  Creatinine 0.61 - 1.24 mg/dL 4.27(H) 5.10(H) 3.60(H)  Sodium 135 - 145 mmol/L 135 138 138  Potassium 3.5 - 5.1 mmol/L 3.5 3.5 3.3(L)  Chloride 101 - 111 mmol/L 96(L) 99(L) 98(L)  CO2 22 - 32 mmol/L 31 29 30   Calcium 8.9 - 10.3 mg/dL 9.1 9.0 8.6(L)  Total Protein 6.1 - 8.1 g/dL - - -  Total Bilirubin 0.2 - 1.2 mg/dL - - -  Alkaline Phos 40 - 115 U/L - - -  AST 10 - 35 U/L - - -  ALT 9 - 46 U/L - - -    Assessment/Plan:  Active Problems:   Human immunodeficiency virus (HIV) disease (HCC)   End stage renal disease on home HD   HBV (hepatitis B virus) infection   COPD (chronic obstructive pulmonary disease) (HCC)   Anemia of chronic disease   Essential hypertension   Lumbosacral spondylosis without myelopathy   Pulmonary nodules   Fall   Symptomatic anemia  Bilateral low back pain without sciatica   Bacteremia   Aortic valve regurgitation   AIDS (HCC)   Hypervolemia   Nonrheumatic aortic valve insufficiency   Chronic bilateral low back pain without sciatica   ESRD on hemodialysis (HCC)  SOB, Resolved Likely multifactorial due to anemia of chronic disease and volume overload. Pts symptoms have resolved since HD sessions and transfusion. Hb stable at 8 today. -Mircera per nephrology -Daily CBC -Transfuse if Hb > 7  ESRD on HD HD yesterday, nephrology on and appreciate their recommendations -HD tomorrow  Hx of MSSA Bacteremia Blood cultures negative to date. TEE performed yesterday negative for vegitations however did show severe aortic insufficiency. Has preserved LVEF and there was no left ventricular dilation, therefore does not qualify for aortic valve replacement at this time. Pt will require f/u TTE as an outpatient in 1 year to assess for progression.  -f/u results of MRI to exclude lumbar discitis. If normal, will be d/c home today. Pt has been given anilyxolytins  HIV, AIDS Pt follows with Dr. Johnnye Sima as an outpt. Reports compliance with ART. Most recent CD4 250 and HIV Viral load <20. ID consulted who wish to continue current ART therapy.  Back Pain As above. F/u MRI. Small refill given of Percocet to tide over til pt can see IR as an outpt.  -PT/OT  Lung Nodules Found incidentally on CT renal stone study during last admission. Multiple, measuring up to 63mm. Will   Dispo: Anticipated discharge today pending results of MRI.   Willeen Novak, DO 09/12/2016, 12:45 PM Pager: [phone]

## 2016-09-12 NOTE — Progress Notes (Signed)
Patient discharge teaching given, including activity, diet, follow-up appoints, and medications. Patient verbalized understanding of all discharge instructions. IV access was d/c'd. Vitals are stable. Skin is intact except as charted in most recent assessments. Pt to be escorted out by NT, to be driven home by family.  Jani Moronta, MBA, BSN, RN 

## 2016-09-12 NOTE — Progress Notes (Signed)
[facility] Progress Note   Subjective:  Seen in bed. Says breathing is much improved, off nasal oxygen. No CP or abdominal pain at this time. Still with severe back pain, had a rough night sleeping last night. TEE was negative for endocarditis, unable to complete MRI and now scheduled for sedated MRI today.  Objective Vitals:   09/11/16 2119 09/11/16 2244 09/12/16 0515 09/12/16 0800  BP: 99/64 107/77 (!) 197/63 (!) 162/71  Pulse: 79 81 75 73  Resp: 16  16 18   Temp: 98.3 F (36.8 C)  98.4 F (36.9 C) 98.5 F (36.9 C)  TempSrc: Oral  Oral Oral  SpO2: 95%  99% 98%  Weight:      Height:       Physical Exam General: Well appearing male, NAD. Heart: RRR; 3/6 systolic murmur. Lungs: CTAB. Extremities: No LE edema. Dialysis Access: L forearm AVF + thrill  Additional Objective Labs: Basic Metabolic Panel:  Recent Labs Lab 09/10/16 1551 09/11/16 0618 09/12/16 0614  NA 138 138 135  K 3.3* 3.5 3.5  CL 98* 99* 96*  CO2 30 29 31   GLUCOSE 115* 101* 95  BUN 10 20 22*  CREATININE 3.60* 5.10* 4.27*  CALCIUM 8.6* 9.0 9.1  PHOS 1.2* 2.0* 1.3*   Liver Function Tests:  Recent Labs Lab 09/10/16 1551 09/11/16 0618 09/12/16 0614  ALBUMIN 3.0* 3.1* 3.1*   CBC:  Recent Labs Lab 09/09/16 0155 09/09/16 1856 09/10/16 0645 09/10/16 1551 09/11/16 0618 09/12/16 0614  WBC 6.8 9.2 7.2 7.8 8.2 8.1  NEUTROABS 5.1  --   --   --   --   --   HGB 6.3* 8.2* 7.2* 7.4* 7.4* 8.0*  HCT 19.9* 25.4* 22.3* 22.5* 23.3* 24.8*  MCV 92.1 90.7 92.5 92.2 93.6 91.2  PLT 139* 174 146* 152 153 166   Blood Culture    Component Value Date/Time   SDES BLOOD RIGHT HAND 09/09/2016 1209   SPECREQUEST BOTTLES DRAWN AEROBIC ONLY  5CC 09/09/2016 1209   CULT NO GROWTH 2 DAYS 09/09/2016 1209   REPTSTATUS PENDING 09/09/2016 1209    Cardiac Enzymes:  Recent Labs Lab 09/09/16 0155  TROPONINI 0.05*   Studies/Results: Dg Chest 2 View  Result Date: 09/10/2016 CLINICAL DATA:  CHF.  EXAM: CHEST  2 VIEW COMPARISON:  Radiographs yesterday FINDINGS: Improving lung aeration from prior. Decreasing pulmonary edema. Small pleural effusions, similar to prior. Cardiomegaly is unchanged allowing for differences in technique. No new airspace disease. No pneumothorax. IMPRESSION: Improving CHF with decreasing pulmonary edema and improving lung aeration. Small pleural effusions persist. Electronically Signed   By: Jeb Levering M.D.   On: 09/10/2016 18:59   Mr Lumbar Spine Wo Contrast  Result Date: 09/10/2016 CLINICAL DATA:  Bacteremia. EXAM: MRI LUMBAR SPINE WITHOUT CONTRAST TECHNIQUE: Multiplanar, multisequence MR imaging of the lumbar spine was performed. No intravenous contrast was administered. COMPARISON:  08/05/2016 FINDINGS: Only sagittal T1 and T2 imaging could be obtained before shortness of breath required termination of the scan. Segmentation:  Standard Alignment:  Normal Vertebrae: Abnormal hypointensity of the marrow space aside from small islands of fatty marrow diffusely. Suspect this is from anemia or renal osteodystrophy based on osseous findings on November 2017 abdominal CT. No acute fracture. Conus medullaris: Extends to the L2 level and appears normal. Paraspinal and other soft tissues: No acute abnormality noted. Disc levels: T12- L1: Unremarkable. L1-L2: Mild disc narrowing and desiccation.  No impingement L2-L3: Unremarkable. L3-L4: Mild disc narrowing and desiccation. Mild facet spurring. No  impingement L4-L5: Advanced disc narrowing with spurring and disc bulging. Central disc protrusion that is small. Disc T2 hyperintensity seen previously is diminished. Stable T1 appearance of the endplates which are difficult to visualize due to chronic marrow changes. Disc narrowing and bulging with ridging causes moderate bilateral foraminal stenosis. Widely patent canal L5-S1:Degenerative disc narrowing with spurring and ridging. Mild bilateral foraminal stenosis. Widely patent  canal. IMPRESSION: 1. Only 2 sequences could be obtained before shortness of breath required termination of the exam. 2. No overt discitis or change compared to August 05, 2016 comparison. When clinically able, would continue this study to further evaluate the L4-5 disc due to disc and psoas T2 hyperintensity noted on the previous study (findings seen with discitis or dialysis related spondyloarthropathy). 3. L4-5 moderate bilateral foraminal stenosis. 4. Abnormal marrow, favor anemia or renal osteodystrophy. Marrow infiltration is not excluded. Electronically Signed   By: Monte Fantasia M.D.   On: 09/10/2016 17:08   Medications:  . sodium chloride   Intravenous Once  . amLODipine  5 mg Oral BID  . calcium acetate  1,334 mg Oral TID WC   And  . calcium acetate  667-1,334 mg Oral With snacks  . carvedilol  25 mg Oral BID WC  . feeding supplement (NEPRO CARB STEADY)  237 mL Oral BID BM  . hydrALAZINE  100 mg Oral TID  . lamiVUDine  25 mg Oral Daily  . mouth rinse  15 mL Mouth Rinse BID  . multivitamin  1 tablet Oral Daily  . raltegravir  400 mg Oral BID  . tenofovir  300 mg Oral Q Sat    Dialysis Orders: Home Hemodialysis (Nx stage) 3-4x/week, EDW 85kg, L AVF - Heparin 6000 unit bolus q HD - ESA: Last given Mircera 235mcg subq on 12/18 - No IV iron (last tsat 32% 12/13)  Assessment/Plan: 1. Pulmonary edema/Volume overload: Improved with volume removal with HD. CXR 09/10/16 shows improving CHF with decreased pulmonary edema. Will have lower EDW on discharge. 2. ESRD: HHD 3-4x/week. Next HD due tomorrow, either here or at home if discharged. 3. Hypertension/volume: BP high/variable. EDW will be lowered on d/c. Has resumed amlodipine, carvedilol, hydralazine. 4. Anemia: Hgb up to 8, 1U PRBCs given 12/20. Just dosed with ESA 12/18, none ordered for now. Denies GI losses, but would check guaiacs to be sure. 5. Metabolic bone disease: Ca ok, Phos is low. Holding binders. 6. HIV: On  HAART; per primary. 7. Recent MSSA bacteremia: This was not related to catheter, he has only had AVF for the past few years. There was question of possible aortic valve endocarditis. Off abx currently. Surveillance BCx  drawn per primary, negative so far. ID consulted, recommended MRI of spine to R/O discitis/epidural abscess. MRI partially completed before terminated for C/O SOB. No overt discitis or change compared to August 05, 2016 comparison, going for sedated MRI today. TEE negative for endocarditis.  Veneta Penton, PA-C 09/12/2016, 10:08 AM  [facility] Pager: [phone]  Renal: He has severe AI by TEE with no vegetation.  This will be problematic in the future. He will continue to reduce weight at home and f/u closely with [facility] & Dr Joelyn Oms to establish a new EDW. Sailor Hevia C

## 2016-09-12 NOTE — Progress Notes (Signed)
PT Cancellation Note  Patient Details Name: STACEY MAURA MRN: [id number] DOB: Oct 30, 1955   Cancelled Treatment:    Reason Eval/Treat Not Completed: Fatigue/lethargy limiting ability to participate. Pt stated that he recently returned from having an MRI. He reported that he was given Ativan and is very lethargic and tired at this time. PT will continue to f/u with pt as appropriate.   [place] 09/12/2016, 1:38 PM

## 2016-09-12 NOTE — Progress Notes (Signed)
Patient refused bed alarm. Monitring patient.

## 2016-09-12 NOTE — Progress Notes (Signed)
Internal Medicine Attending:   I saw and examined the patient. I reviewed the resident's note and I agree with the resident's findings and plan as documented in the resident's note. Patient reports doing well today, no complaints.  SOB has improved with dialysis.   On exam, he has 1+ pedal edema bilaterally, grossly clear lungs bilaterally, heart is RRR with a 3/6 murmur.  He has completed his lumbar MRI which is negative for discitis or epidural abscess, his TEE showed no signs of vegetation but did reveal severe AI which will need to be followed up as an outpatient.  Otherwise he is stable for discharge.

## 2016-09-13 NOTE — Discharge Summary (Signed)
Name: Jeffrey [name] MRN: [id number] DOB: [birth year] 60 y.o. PCP: No primary care provider on file.  Date of Admission: 09/09/2016  1:16 AM Date of Discharge: 09/13/2016 Attending Physician: No att. providers found  Discharge Diagnosis: 1. Shortness of breath 2. End-stage renal disease on hemodialysis 3. HIV, AIDS 4. Chronic back pain 5. Aortic insufficiency  Active Problems:   Human immunodeficiency virus (HIV) disease (HCC)   End stage renal disease on home HD   HBV (hepatitis B virus) infection   COPD (chronic obstructive pulmonary disease) (HCC)   Anemia of chronic disease   Essential hypertension   Lumbosacral spondylosis without myelopathy   Pulmonary nodules   Fall   Symptomatic anemia   Bilateral low back pain without sciatica   Bacteremia   Aortic valve regurgitation   AIDS ([place])   Hypervolemia   Nonrheumatic aortic valve insufficiency   Chronic bilateral low back pain without sciatica   ESRD on hemodialysis [facility])   Discharge Medications: Allergies as of 09/12/2016      Reactions   Lisinopril Shortness Of Breath, Swelling   Throat swelling   Penicillins Other (See Comments)   Childhood allergy Has patient had a PCN reaction causing immediate rash, facial/tongue/throat swelling, SOB or lightheadedness with hypotension: Unk Has patient had a PCN reaction causing severe rash involving mucus membranes or skin necrosis: Unk Has patient had a PCN reaction that required hospitalization: Unk Has patient had a PCN reaction occurring within the last 10 years: No If all of the above answers are "NO", then may proceed with Cephalosporin use.      Medication List    STOP taking these medications   ceFAZolin 2-4 GM/100ML-% IVPB Commonly known as:  ANCEF   traMADol 50 MG tablet Commonly known as:  ULTRAM     TAKE these medications   albuterol (2.5 MG/3ML) 0.083% nebulizer solution Commonly known as:  PROVENTIL Take 2.5 mg by nebulization every 6 (six) hours  as needed for wheezing or shortness of breath.   amLODipine 10 MG tablet Commonly known as:  NORVASC Take 5 mg by mouth 2 (two) times daily.   calcium acetate 667 MG capsule Commonly known as:  PHOSLO Take 667-2,001 mg by mouth See admin instructions. 1,334-2,001 mg three times a day with each meal and 667-1,334 mg with each snack   carvedilol 25 MG tablet Commonly known as:  COREG Take 25 mg by mouth 2 (two) times daily with a meal.   cloNIDine 0.1 MG tablet Commonly known as:  CATAPRES Take 0.1 mg by mouth as needed (as needed related to hypertension while on hemodialysis).   COMBIVENT RESPIMAT 20-100 MCG/ACT Aers respimat Generic drug:  Ipratropium-Albuterol INHALE 1 PUFF BY MOUTH EVERY 4 HOURS AS NEEDED FOR WHEEZING OR SHORTNESS OF BREATH   EPIVIR HBV 5 MG/ML solution Generic drug:  lamivudine TAKE 5 ML BY MOUTH ONCE DAILY   ethyl chloride spray Apply 1 application topically daily as needed. At HD   hydrALAZINE 100 MG tablet Commonly known as:  APRESOLINE Take 100 mg by mouth 3 (three) times daily.   ISENTRESS 400 MG tablet Generic drug:  raltegravir TAKE 1 TABLET(400 MG) BY MOUTH TWICE DAILY   multivitamin Tabs tablet Take 1 tablet by mouth daily.   oxyCODONE-acetaminophen 5-325 MG tablet Commonly known as:  PERCOCET/ROXICET Take 1 tablet by mouth every 8 (eight) hours as needed for moderate pain or severe pain. What changed:  when to take this   tenofovir 300 MG tablet Commonly known  as:  VIREAD Take 1 tablet (300 mg total) by mouth every Saturday. After dialysis on Saturday   traZODone 50 MG tablet Commonly known as:  DESYREL Take 50 mg by mouth at bedtime as needed for sleep.       Disposition and follow-up:   [name] was discharged from [facility] in Stable condition.  At the hospital follow up visit please address:  1.  Has the patient's shortness of breath and continuing hemodialysis?  2.  Labs / imaging needed at time  of follow-up: None  3.  Pending labs/ test needing follow-up: None  Follow-up Appointments:   Hospital Course by problem list: Active Problems:   Human immunodeficiency virus (HIV) disease ([place])   End stage renal disease on home HD   HBV (hepatitis B virus) infection   COPD (chronic obstructive pulmonary disease) (HCC)   Anemia of chronic disease   Essential hypertension   Lumbosacral spondylosis without myelopathy   Pulmonary nodules   Fall   Symptomatic anemia   Bilateral low back pain without sciatica   Bacteremia   Aortic valve regurgitation   AIDS (HCC)   Hypervolemia   Nonrheumatic aortic valve insufficiency   Chronic bilateral low back pain without sciatica   ESRD on hemodialysis ([place])   1. Shortness of breath, ESRD on hemodialysis [name] is a 60 year old male with extensive medical history including end-stage renal disease on home hemodialysis, HIV with low CD4 count and recent MSSA bacteremia with insufficient treatment who presented 09/09/16 for evaluation of acute onset shortness of breath that began at midnight the day of admission. The patient denied any fevers, shakes, chills or chest pain. The patient reports he's been losing weight and that his estimated dry weight might be too high and had personally lowered his estimated dry weight by 1 pound recently. He received hemodialysis while in the hospital with complete resolution of his shortness of breath.  2. HIV CD4 count obtained during admission 250 however viral load less than 20. Patient is not on PCP prophylaxis despite this low CD4 count. Due to his shortness of breath and low CD4 count Infectious disease was consult and who recommended continuation of his current antiretroviral therapy  Low back pain, History of MSSA bacteremia Chronic and has responded previously to During admission he underwent hemodialysis with marked improvement of his dyspnea however the possibility of persistent MSSA bacteremia needed  to be ruled out. He underwent a TEE which is negative for intracardiac vegetations however did show severe aortic insufficiency which requires repeat echo in one year. Blood cultures negative for bacterial growth. The patient does have a history of chronic low back pain however given his recent insufficiently treated MSSA bacteremia and MRI of lumbar spine was ordered which was negative for any discitis or signs of osteomyelitis.  Aortic insufficiency TEE indicated severe aortic insufficiency and requires a repeat echo in one year. He did not meet criteria for valve replacement at that time.  Discharge Vitals:   BP (!) 162/71 (BP Location: Right Arm)   Pulse 73   Temp 98.5 F (36.9 C) (Oral)   Resp 18   Ht 6' (1.829 m)   Wt 182 lb 15.7 oz (83 kg)   SpO2 98%   BMI 24.82 kg/m   Pertinent Labs, Studies, and Procedures:  Transesophageal echocardiogram 09/11/16: No evidence for vegetations however did show severe aortic insufficiency requiring follow-up in one year Blood cultures negative for bacterial growth MRI: Negative for discitis or  osteo-myelitis however did show significant bony lumbar disease  Discharge Instructions:  Please follow-up with the internal medicine residency clinic 09/16/16 to discuss her recent hospitalization. In addition please continue to follow-up with infectious disease and nephrology.  SignedEinar Gip, DO 09/13/2016, 6:34 PM   Pager: [phone]

## 2016-09-14 LAB — CULTURE, BLOOD (ROUTINE X 2)
Culture: NO GROWTH
Culture: NO GROWTH

## 2016-09-16 ENCOUNTER — Ambulatory Visit (INDEPENDENT_AMBULATORY_CARE_PROVIDER_SITE_OTHER): Payer: Medicare Other | Admitting: Pulmonary Disease

## 2016-09-16 ENCOUNTER — Telehealth: Payer: Self-pay | Admitting: Licensed Clinical Social Worker

## 2016-09-16 VITALS — BP 167/75 | HR 66 | Temp 98.1°F | Ht 72.0 in | Wt 192.1 lb

## 2016-09-16 DIAGNOSIS — M47817 Spondylosis without myelopathy or radiculopathy, lumbosacral region: Secondary | ICD-10-CM | POA: Diagnosis not present

## 2016-09-16 DIAGNOSIS — D631 Anemia in chronic kidney disease: Secondary | ICD-10-CM | POA: Diagnosis not present

## 2016-09-16 DIAGNOSIS — E8779 Other fluid overload: Secondary | ICD-10-CM | POA: Diagnosis not present

## 2016-09-16 DIAGNOSIS — N186 End stage renal disease: Secondary | ICD-10-CM

## 2016-09-16 DIAGNOSIS — Z992 Dependence on renal dialysis: Secondary | ICD-10-CM | POA: Diagnosis not present

## 2016-09-16 DIAGNOSIS — X58XXXD Exposure to other specified factors, subsequent encounter: Secondary | ICD-10-CM

## 2016-09-16 DIAGNOSIS — Z5189 Encounter for other specified aftercare: Secondary | ICD-10-CM

## 2016-09-16 DIAGNOSIS — Z87891 Personal history of nicotine dependence: Secondary | ICD-10-CM

## 2016-09-16 DIAGNOSIS — R7881 Bacteremia: Secondary | ICD-10-CM | POA: Diagnosis not present

## 2016-09-16 DIAGNOSIS — I351 Nonrheumatic aortic (valve) insufficiency: Secondary | ICD-10-CM

## 2016-09-16 DIAGNOSIS — S80211D Abrasion, right knee, subsequent encounter: Secondary | ICD-10-CM | POA: Diagnosis not present

## 2016-09-16 DIAGNOSIS — D509 Iron deficiency anemia, unspecified: Secondary | ICD-10-CM | POA: Diagnosis not present

## 2016-09-16 DIAGNOSIS — Z79899 Other long term (current) drug therapy: Secondary | ICD-10-CM | POA: Diagnosis not present

## 2016-09-16 DIAGNOSIS — S80211A Abrasion, right knee, initial encounter: Secondary | ICD-10-CM

## 2016-09-16 NOTE — Assessment & Plan Note (Signed)
Assessment: Dyspnea due to volume overload. His EDW was adjusted to 83kg. Weight this morning was 83kg at home. Elevated in clinic today but clothing probably contributing to some of his weight. Also has not had his HD session for today.  Plan: Continue home HD. Follow up with nephrology scheduled.

## 2016-09-16 NOTE — Progress Notes (Signed)
   CC: hospitalization for dyspnea  HPI:  Jeffrey [name] is a 60 y.o. man with ESRD on home HD, HIV, HTN, COPD presenting for follow up of dyspnea and volume overload.  New dry weight is 83kg. At home it was 83kg this morning. His breathing is ok. He has not had his treatment today. He has follow up with his renal MD on the 22nd of January. He has labs on the 2nd. He sleeps with two pillows at night. Sometimes has PND, none recently. His LE edema has improved.  Severe aortic insufficiency: will need repeat echo in 1 year  His back pain is better but still there. He has been getting around a lot better.    Past Medical History:  Diagnosis Date  . Anemia   . Anxiety   . Arthritis   . Asthma    per pt hx  . End stage renal disease on home HD 07/10/2011   Started HD in September 2012 at [facility] with a tunneled HD catheter, now on home HD with NxtStage. Dialyzing through AVF L lower arm with buttonhole technique as of mid 2014. His brother does the HD treatments at home.  They are roommates for 23 years.  The brother works 3rd shift and gets off about 8am and then puts Jeffrey [name] on HD in the morning after getting home. Most of the time he does HD about 4 times a week, for about 4 hours per treatment. Cause of ESRD was HTN according to patient. He says he let his health go and ending up with complications, and that he didn't like seeing doctors in those days.  He says he was diagnosed with severe HTN when he lived in [place] in his 81's.   . Gout   . Hepatitis B carrier ([place])   . HIV infection ([place])   . Hypertension   . Hyperthyroidism   . Pneumonia   . Seizure ([place])   . Seizures ([place]) 06/02/2011   pt denies this  . Thrombocytopenia ([place])     Review of Systems:   No fevers or chills No chest pain  Physical Exam:  Vitals:   09/16/16 1039  BP: (!) 167/75  Pulse: 66  Temp: 98.1 F (36.7 C)  TempSrc: Oral  SpO2: 100%  Weight: 192 lb 1.6 oz (87.1 kg)  Height: 6' (1.829 m)    General Apperance: NAD HEENT: Normocephalic, atraumatic, anicteric sclera Neck: Supple, trachea midline Lungs: Clear to auscultation bilaterally. No wheezes, rhonchi or rales. Breathing comfortably Heart: Regular rate and rhythm, no murmur/rub/gallop Abdomen: Soft, nontender, nondistended, no rebound/guarding Extremities: Warm and well perfused, trace pedal edema Skin: Right knee abrasion healing well and wound smaller. Neurologic: Alert and interactive. No gross deficits.   Assessment & Plan:   See Encounters Tab for problem based charting.  Patient discussed with Dr. Daryll Drown

## 2016-09-16 NOTE — Telephone Encounter (Signed)
Jeffrey [name] was referred for [facility] services.  CSW placed call to patient to obtain agency of choice.  Message left requesting return call.

## 2016-09-16 NOTE — Assessment & Plan Note (Signed)
Noted as severe on echo. Repeat echo in 1 year.

## 2016-09-16 NOTE — Assessment & Plan Note (Signed)
Assessment: He did not have PT evaluation completed as inpatient. Pain improving but he has not returned to baseline yet.  Plan: Home PT eval and treat

## 2016-09-16 NOTE — Patient Instructions (Signed)
Please follow up in 4 to 6 weeks

## 2016-09-16 NOTE — Telephone Encounter (Signed)
CSW placed call to [name].  Pt states he has not utilized [facility] services recently and does not have preference.  Referral to [facility] completed.

## 2016-09-16 NOTE — Assessment & Plan Note (Signed)
Abrasion appears to be healing well. Smaller on exam today. No surrounding infection.

## 2016-09-17 ENCOUNTER — Telehealth: Payer: Self-pay | Admitting: *Deleted

## 2016-09-17 DIAGNOSIS — N186 End stage renal disease: Secondary | ICD-10-CM | POA: Diagnosis not present

## 2016-09-17 DIAGNOSIS — M47817 Spondylosis without myelopathy or radiculopathy, lumbosacral region: Secondary | ICD-10-CM | POA: Diagnosis not present

## 2016-09-17 DIAGNOSIS — D509 Iron deficiency anemia, unspecified: Secondary | ICD-10-CM | POA: Diagnosis not present

## 2016-09-17 DIAGNOSIS — R7881 Bacteremia: Secondary | ICD-10-CM | POA: Diagnosis not present

## 2016-09-17 DIAGNOSIS — E8779 Other fluid overload: Secondary | ICD-10-CM | POA: Diagnosis not present

## 2016-09-17 DIAGNOSIS — D631 Anemia in chronic kidney disease: Secondary | ICD-10-CM | POA: Diagnosis not present

## 2016-09-17 DIAGNOSIS — J449 Chronic obstructive pulmonary disease, unspecified: Secondary | ICD-10-CM | POA: Diagnosis not present

## 2016-09-17 DIAGNOSIS — Z79899 Other long term (current) drug therapy: Secondary | ICD-10-CM | POA: Diagnosis not present

## 2016-09-17 NOTE — Telephone Encounter (Signed)
EPIC message from  [place].  Pt's SOC date is 09/17/16.

## 2016-09-17 NOTE — Telephone Encounter (Signed)
[facility] calls for VO approval for  PT 1x week for this week 2x week for 4 weeks 1x week for 2 weeks VO given, do you agree?

## 2016-09-17 NOTE — Telephone Encounter (Signed)
Yes I agree. Thank you 

## 2016-09-19 DIAGNOSIS — R7881 Bacteremia: Secondary | ICD-10-CM | POA: Diagnosis not present

## 2016-09-19 DIAGNOSIS — D509 Iron deficiency anemia, unspecified: Secondary | ICD-10-CM | POA: Diagnosis not present

## 2016-09-19 DIAGNOSIS — Z79899 Other long term (current) drug therapy: Secondary | ICD-10-CM | POA: Diagnosis not present

## 2016-09-19 DIAGNOSIS — E8779 Other fluid overload: Secondary | ICD-10-CM | POA: Diagnosis not present

## 2016-09-19 DIAGNOSIS — N186 End stage renal disease: Secondary | ICD-10-CM | POA: Diagnosis not present

## 2016-09-19 DIAGNOSIS — D631 Anemia in chronic kidney disease: Secondary | ICD-10-CM | POA: Diagnosis not present

## 2016-09-20 DIAGNOSIS — Z992 Dependence on renal dialysis: Secondary | ICD-10-CM | POA: Diagnosis not present

## 2016-09-20 DIAGNOSIS — I120 Hypertensive chronic kidney disease with stage 5 chronic kidney disease or end stage renal disease: Secondary | ICD-10-CM | POA: Diagnosis not present

## 2016-09-20 DIAGNOSIS — N186 End stage renal disease: Secondary | ICD-10-CM | POA: Diagnosis not present

## 2016-09-21 DIAGNOSIS — N2581 Secondary hyperparathyroidism of renal origin: Secondary | ICD-10-CM | POA: Diagnosis not present

## 2016-09-21 DIAGNOSIS — D509 Iron deficiency anemia, unspecified: Secondary | ICD-10-CM | POA: Diagnosis not present

## 2016-09-21 DIAGNOSIS — D631 Anemia in chronic kidney disease: Secondary | ICD-10-CM | POA: Diagnosis not present

## 2016-09-21 DIAGNOSIS — N186 End stage renal disease: Secondary | ICD-10-CM | POA: Diagnosis not present

## 2016-09-21 DIAGNOSIS — E8779 Other fluid overload: Secondary | ICD-10-CM | POA: Diagnosis not present

## 2016-09-21 DIAGNOSIS — Z4931 Encounter for adequacy testing for hemodialysis: Secondary | ICD-10-CM | POA: Diagnosis not present

## 2016-09-22 ENCOUNTER — Other Ambulatory Visit: Payer: Self-pay | Admitting: Infectious Diseases

## 2016-09-22 DIAGNOSIS — M109 Gout, unspecified: Secondary | ICD-10-CM | POA: Diagnosis not present

## 2016-09-22 DIAGNOSIS — E785 Hyperlipidemia, unspecified: Secondary | ICD-10-CM | POA: Diagnosis not present

## 2016-09-22 DIAGNOSIS — D509 Iron deficiency anemia, unspecified: Secondary | ICD-10-CM | POA: Diagnosis not present

## 2016-09-22 DIAGNOSIS — N2581 Secondary hyperparathyroidism of renal origin: Secondary | ICD-10-CM | POA: Diagnosis not present

## 2016-09-22 DIAGNOSIS — N186 End stage renal disease: Secondary | ICD-10-CM | POA: Diagnosis not present

## 2016-09-22 DIAGNOSIS — B20 Human immunodeficiency virus [HIV] disease: Secondary | ICD-10-CM

## 2016-09-22 DIAGNOSIS — D631 Anemia in chronic kidney disease: Secondary | ICD-10-CM | POA: Diagnosis not present

## 2016-09-22 DIAGNOSIS — Z4931 Encounter for adequacy testing for hemodialysis: Secondary | ICD-10-CM | POA: Diagnosis not present

## 2016-09-23 DIAGNOSIS — M47817 Spondylosis without myelopathy or radiculopathy, lumbosacral region: Secondary | ICD-10-CM | POA: Diagnosis not present

## 2016-09-23 DIAGNOSIS — J449 Chronic obstructive pulmonary disease, unspecified: Secondary | ICD-10-CM | POA: Diagnosis not present

## 2016-09-24 DIAGNOSIS — D509 Iron deficiency anemia, unspecified: Secondary | ICD-10-CM | POA: Diagnosis not present

## 2016-09-24 DIAGNOSIS — N186 End stage renal disease: Secondary | ICD-10-CM | POA: Diagnosis not present

## 2016-09-24 DIAGNOSIS — D631 Anemia in chronic kidney disease: Secondary | ICD-10-CM | POA: Diagnosis not present

## 2016-09-24 DIAGNOSIS — N2581 Secondary hyperparathyroidism of renal origin: Secondary | ICD-10-CM | POA: Diagnosis not present

## 2016-09-24 DIAGNOSIS — Z4931 Encounter for adequacy testing for hemodialysis: Secondary | ICD-10-CM | POA: Diagnosis not present

## 2016-09-25 NOTE — Progress Notes (Signed)
Internal Medicine Clinic Attending  Case discussed with Dr. Krall soon after the resident saw the patient.  We reviewed the resident's history and exam and pertinent patient test results.  I agree with the assessment, diagnosis, and plan of care documented in the resident's note. 

## 2016-09-26 DIAGNOSIS — Z4931 Encounter for adequacy testing for hemodialysis: Secondary | ICD-10-CM | POA: Diagnosis not present

## 2016-09-26 DIAGNOSIS — D631 Anemia in chronic kidney disease: Secondary | ICD-10-CM | POA: Diagnosis not present

## 2016-09-26 DIAGNOSIS — J449 Chronic obstructive pulmonary disease, unspecified: Secondary | ICD-10-CM | POA: Diagnosis not present

## 2016-09-26 DIAGNOSIS — D509 Iron deficiency anemia, unspecified: Secondary | ICD-10-CM | POA: Diagnosis not present

## 2016-09-26 DIAGNOSIS — N2581 Secondary hyperparathyroidism of renal origin: Secondary | ICD-10-CM | POA: Diagnosis not present

## 2016-09-26 DIAGNOSIS — N186 End stage renal disease: Secondary | ICD-10-CM | POA: Diagnosis not present

## 2016-09-26 DIAGNOSIS — M47817 Spondylosis without myelopathy or radiculopathy, lumbosacral region: Secondary | ICD-10-CM | POA: Diagnosis not present

## 2016-09-28 ENCOUNTER — Encounter: Payer: Self-pay | Admitting: Infectious Diseases

## 2016-09-28 ENCOUNTER — Ambulatory Visit (INDEPENDENT_AMBULATORY_CARE_PROVIDER_SITE_OTHER): Payer: Medicare Other | Admitting: Infectious Diseases

## 2016-09-28 VITALS — BP 178/81 | HR 62 | Temp 97.7°F | Ht 72.0 in | Wt 186.0 lb

## 2016-09-28 DIAGNOSIS — N186 End stage renal disease: Secondary | ICD-10-CM

## 2016-09-28 DIAGNOSIS — N2581 Secondary hyperparathyroidism of renal origin: Secondary | ICD-10-CM | POA: Diagnosis not present

## 2016-09-28 DIAGNOSIS — D509 Iron deficiency anemia, unspecified: Secondary | ICD-10-CM | POA: Diagnosis not present

## 2016-09-28 DIAGNOSIS — D631 Anemia in chronic kidney disease: Secondary | ICD-10-CM | POA: Diagnosis not present

## 2016-09-28 DIAGNOSIS — Z79899 Other long term (current) drug therapy: Secondary | ICD-10-CM | POA: Diagnosis not present

## 2016-09-28 DIAGNOSIS — Z113 Encounter for screening for infections with a predominantly sexual mode of transmission: Secondary | ICD-10-CM | POA: Diagnosis not present

## 2016-09-28 DIAGNOSIS — D638 Anemia in other chronic diseases classified elsewhere: Secondary | ICD-10-CM

## 2016-09-28 DIAGNOSIS — I351 Nonrheumatic aortic (valve) insufficiency: Secondary | ICD-10-CM | POA: Diagnosis not present

## 2016-09-28 DIAGNOSIS — B20 Human immunodeficiency virus [HIV] disease: Secondary | ICD-10-CM

## 2016-09-28 DIAGNOSIS — Z4931 Encounter for adequacy testing for hemodialysis: Secondary | ICD-10-CM | POA: Diagnosis not present

## 2016-09-28 NOTE — Assessment & Plan Note (Signed)
Was giving himself scheduled epo with some improvement.  Now on different injection Ilean China) and not working as well.

## 2016-09-28 NOTE — Assessment & Plan Note (Signed)
He is doing well.  Continue his current art Has gotten flu and pnvx.  Will see him back in 4 months.

## 2016-09-28 NOTE — Assessment & Plan Note (Signed)
Suspect this is driving his current sx.  Consider CV eval.

## 2016-09-28 NOTE — Assessment & Plan Note (Signed)
Appreciate renal f/u.  His fistula is +bruit.

## 2016-09-28 NOTE — Progress Notes (Signed)
   Subjective:    Patient ID: Jeffrey [name], male    DOB: Jul 31, 1956, 61 y.o.   MRN: [id number]  HPI 61 yo M with HIV+, Hepatitis B, ESRD, HTN. On 3TC/ISN/TFV. He was hospitalized 07-2016 with MSSE bacteremia. He then returned to [facility] on 12-20 to 09-13-16 with worsening SOB and back pain. He had TEE (-) fo vegetations, + AI. His MRI lumbar spine did not show infection.  His repeat BCx were (-). He was d/c home off anbx.  Today he feels tired, cold. Still feels SOB, like he is suffocating some times.  No problems with ART.  No problems with HD.   HIV 1 RNA Quant (copies/mL)  Date Value  09/09/2016 <20  06/10/2016 <20  04/07/2016 <20   CD4 T Cell Abs (/uL)  Date Value  09/09/2016 250 (L)  08/07/2016 160 (L)  06/10/2016 360 (L)   Has renal f/u 10-18-15.  Review of Systems  Constitutional: Positive for unexpected weight change. Negative for appetite change.  Respiratory: Positive for shortness of breath.   Cardiovascular: Negative for chest pain.  Neurological: Negative for headaches.  has lost 6 lb from last visit.      Objective:   Physical Exam  Constitutional: He appears well-developed and well-nourished.  HENT:  Mouth/Throat: No oropharyngeal exudate.  Eyes: EOM are normal. Pupils are equal, round, and reactive to light.  Neck: Neck supple.  Cardiovascular: Normal rate, regular rhythm and normal heart sounds.   Pulmonary/Chest: Effort normal and breath sounds normal.  Abdominal: Soft. Bowel sounds are normal. There is no tenderness. There is no rebound.  Musculoskeletal: He exhibits no edema.  Lymphadenopathy:    He has no cervical adenopathy.      Assessment & Plan:

## 2016-09-29 DIAGNOSIS — N2581 Secondary hyperparathyroidism of renal origin: Secondary | ICD-10-CM | POA: Diagnosis not present

## 2016-09-29 DIAGNOSIS — Z4931 Encounter for adequacy testing for hemodialysis: Secondary | ICD-10-CM | POA: Diagnosis not present

## 2016-09-29 DIAGNOSIS — D509 Iron deficiency anemia, unspecified: Secondary | ICD-10-CM | POA: Diagnosis not present

## 2016-09-29 DIAGNOSIS — N186 End stage renal disease: Secondary | ICD-10-CM | POA: Diagnosis not present

## 2016-09-29 DIAGNOSIS — D631 Anemia in chronic kidney disease: Secondary | ICD-10-CM | POA: Diagnosis not present

## 2016-09-30 ENCOUNTER — Telehealth: Payer: Self-pay | Admitting: *Deleted

## 2016-09-30 NOTE — Telephone Encounter (Signed)
Patient called to check on his Cardiology appt. Looked in the system and it has been scheduled for 10/06/16 at 33. Gave him the address and phone number as well and advised to call with any questions.

## 2016-10-01 ENCOUNTER — Telehealth: Payer: Self-pay

## 2016-10-01 DIAGNOSIS — N186 End stage renal disease: Secondary | ICD-10-CM | POA: Diagnosis not present

## 2016-10-01 DIAGNOSIS — N2581 Secondary hyperparathyroidism of renal origin: Secondary | ICD-10-CM | POA: Diagnosis not present

## 2016-10-01 DIAGNOSIS — Z4931 Encounter for adequacy testing for hemodialysis: Secondary | ICD-10-CM | POA: Diagnosis not present

## 2016-10-01 DIAGNOSIS — D509 Iron deficiency anemia, unspecified: Secondary | ICD-10-CM | POA: Diagnosis not present

## 2016-10-01 DIAGNOSIS — D631 Anemia in chronic kidney disease: Secondary | ICD-10-CM | POA: Diagnosis not present

## 2016-10-01 NOTE — Telephone Encounter (Signed)
Jeffrey [name] from [place] states pt is refusing therapy services 10/01/2016 and 10/02/2016.

## 2016-10-03 DIAGNOSIS — D509 Iron deficiency anemia, unspecified: Secondary | ICD-10-CM | POA: Diagnosis not present

## 2016-10-03 DIAGNOSIS — D631 Anemia in chronic kidney disease: Secondary | ICD-10-CM | POA: Diagnosis not present

## 2016-10-03 DIAGNOSIS — N186 End stage renal disease: Secondary | ICD-10-CM | POA: Diagnosis not present

## 2016-10-03 DIAGNOSIS — N2581 Secondary hyperparathyroidism of renal origin: Secondary | ICD-10-CM | POA: Diagnosis not present

## 2016-10-03 DIAGNOSIS — Z4931 Encounter for adequacy testing for hemodialysis: Secondary | ICD-10-CM | POA: Diagnosis not present

## 2016-10-04 DIAGNOSIS — D509 Iron deficiency anemia, unspecified: Secondary | ICD-10-CM | POA: Diagnosis not present

## 2016-10-04 DIAGNOSIS — N2581 Secondary hyperparathyroidism of renal origin: Secondary | ICD-10-CM | POA: Diagnosis not present

## 2016-10-04 DIAGNOSIS — N186 End stage renal disease: Secondary | ICD-10-CM | POA: Diagnosis not present

## 2016-10-04 DIAGNOSIS — D631 Anemia in chronic kidney disease: Secondary | ICD-10-CM | POA: Diagnosis not present

## 2016-10-04 DIAGNOSIS — Z4931 Encounter for adequacy testing for hemodialysis: Secondary | ICD-10-CM | POA: Diagnosis not present

## 2016-10-05 NOTE — Progress Notes (Deleted)
Cardiology Office Note  NEW PATIENT VISIT  Date:  10/05/2016   ID:  Jeffrey [name], DOB 1956/08/09, MRN [id number]  PCP:  Jeffrey Ochs, MD  Cardiologist: New    No chief complaint on file.     History of Present Illness: Jeffrey [name] is a 61 y.o. male who presents for post hospital bacteriemia -TEE was done he had no endocarditis normal Lv and severe AI.   history of end-stage renal disease on home hemodialysis, HIV with a CD4 count of 160 in November, chronic anemia, and hypertension who presents with the acute onset of shortness of breath at approximately midnight on the day of admission.  Today***    Past Medical History:  Diagnosis Date  . Anemia   . Anxiety   . Arthritis   . Asthma    per pt hx  . End stage renal disease on home HD 07/10/2011   Started HD in September 2012 at [facility] with a tunneled HD catheter, now on home HD with NxtStage. Dialyzing through AVF L lower arm with buttonhole technique as of mid 2014. His brother does the HD treatments at home.  They are roommates for 23 years.  The brother works 3rd shift and gets off about 8am and then puts Mr Jeffrey [name] on HD in the morning after getting home. Most of the time he does HD about 4 times a week, for about 4 hours per treatment. Cause of ESRD was HTN according to patient. He says he let his health go and ending up with complications, and that he didn't like seeing doctors in those days.  He says he was diagnosed with severe HTN when he lived in [place] in his 40's.   . Gout   . Hepatitis B carrier ([place]rey [name])   . HIV infection ([place])   . Hypertension   . Hyperthyroidism   . Pneumonia   . Seizure (Jeffrey [name])   . Seizures (Jeffrey [name]) 06/02/2011   pt denies this  . Thrombocytopenia ([place])     Past Surgical History:  Procedure Laterality Date  . AV FISTULA PLACEMENT  06/02/11   Left radiocephalic AVF  . COLONOSCOPY    . OTHER SURGICAL HISTORY     removal temporary HD catheter   . REVISON OF ARTERIOVENOUS FISTULA Left  10/10/2015   Procedure: REVISON OF LEFT RADIOCEPHALIC ARTERIOVENOUS FISTULA;  Surgeon: Angelia Mould, MD;  Location: [facility];  Service: Vascular;  Laterality: Left;  . REVISON OF ARTERIOVENOUS FISTULA Left 02/07/2016   Procedure: REPAIR OF PSEUDO-ANEUREYSM OF LEFT ARM  ARTERIOVENOUS FISTULA;  Surgeon: Angelia Mould, MD;  Location: [facility];  Service: Vascular;  Laterality: Left;  . REVISON OF ARTERIOVENOUS FISTULA Left 01/15/8340   Procedure: PLICATION OF LEFT ARM RADIOCEPHALIC ARTERIOVENOUS FISTULA PSEUDOANEURYSM;  Surgeon: Angelia Mould, MD;  Location: [facility];  Service: Vascular;  Laterality: Left;  . TEE WITHOUT CARDIOVERSION N/A 09/11/2016   Procedure: TRANSESOPHAGEAL ECHOCARDIOGRAM (TEE);  Surgeon: Dorothy Spark, MD;  Location[facility] ENDOSCOPY;  Service: Cardiovascular;  Laterality: N/A;     Current Outpatient Prescriptions  Medication Sig Dispense Refill  . albuterol (PROVENTIL) (2.5 MG/3ML) 0.083% nebulizer solution Take 2.5 mg by nebulization every 6 (six) hours as needed for wheezing or shortness of breath.    Marland Kitchen amLODipine (NORVASC) 10 MG tablet Take 5 mg by mouth 2 (two) times daily.     . calcium acetate (PHOSLO) 667 MG capsule Take 667-2,001 mg by mouth See admin instructions. 1,334-2,001 mg three times a  day with each meal and 667-1,334 mg with each snack    . carvedilol (COREG) 25 MG tablet Take 25 mg by mouth 2 (two) times daily with a meal.    . cloNIDine (CATAPRES) 0.1 MG tablet Take 0.1 mg by mouth as needed (as needed related to hypertension while on hemodialysis).   6  . COMBIVENT RESPIMAT 20-100 MCG/ACT AERS respimat INHALE 1 PUFF BY MOUTH EVERY 4 HOURS AS NEEDED FOR WHEEZING OR SHORTNESS OF BREATH 4 g 0  . EPIVIR HBV 5 MG/ML solution TAKE 5 ML BY MOUTH ONCE DAILY 240 mL 3  . ethyl chloride spray Apply 1 application topically daily as needed. At HD  12  . hydrALAZINE (APRESOLINE) 100 MG tablet Take 100 mg by mouth 3 (three) times daily.     . ISENTRESS 400  MG tablet TAKE 1 TABLET(400 MG) BY MOUTH TWICE DAILY 60 tablet 6  . multivitamin (RENA-VIT) TABS tablet Take 1 tablet by mouth daily.      Marland Kitchen oxyCODONE-acetaminophen (PERCOCET/ROXICET) 5-325 MG tablet Take 1 tablet by mouth every 8 (eight) hours as needed for moderate pain or severe pain. 10 tablet 0  . tenofovir (VIREAD) 300 MG tablet TAKE 1 TABLET BY MOUTH EVERY SATURDAY AFTER DIALYSIS 5 tablet 5  . traZODone (DESYREL) 50 MG tablet Take 50 mg by mouth at bedtime as needed for sleep.     No current facility-administered medications for this visit.     Allergies:   Lisinopril and Penicillins    Social History:  The patient  reports that he quit smoking about 13 months ago. His smoking use included Cigarettes. He has never used smokeless tobacco. He reports that he does not drink alcohol or use drugs.   Family History:  The patient's ***family history includes Cancer in his father; Diabetes in his mother; Hypertension in his mother.    ROS:  General:no colds or fevers, no weight changes Skin:no rashes or ulcers HEENT:no blurred vision, no congestion CV:see HPI PUL:see HPI GI:no diarrhea constipation or melena, no indigestion GU:no hematuria, no dysuria MS:no joint pain, no claudication Neuro:no syncope, no lightheadedness Endo:no diabetes, no thyroid disease Wt Readings from Last 3 Encounters:  09/28/16 186 lb (84.4 kg)  09/16/16 192 lb 1.6 oz (87.1 kg)  09/10/16 182 lb 15.7 oz (83 kg)     PHYSICAL EXAM: VS:  There were no vitals taken for this visit. , BMI There is no height or weight on file to calculate BMI. General:Pleasant affect, NAD Skin:Warm and dry, brisk capillary refill HEENT:normocephalic, sclera clear, mucus membranes moist Neck:supple, no JVD, no bruits  Heart:S1S2 RRR without murmur, gallup, rub or click Lungs:clear without rales, rhonchi, or wheezes OMV:[facility], non tender, + BS, do not palpate liver spleen or masses Ext:no lower ext edema, 2+ pedal pulses, 2+  radial pulses Neuro:alert and oriented, MAE, follows commands, + facial symmetry    EKG:  EKG is ordered today. The ekg ordered today demonstrates ***   Recent Labs: 06/10/2016: ALT 5 08/06/2016: Magnesium 1.9 09/09/2016: B Natriuretic Peptide 1,040.1 09/12/2016: BUN 22; Creatinine, Ser 4.27; Hemoglobin 8.0; Platelets 166; Potassium 3.5; Sodium 135    Lipid Panel    Component Value Date/Time   CHOL 73 (L) 04/07/2016 1435   TRIG 64 04/07/2016 1435   HDL 27 (L) 04/07/2016 1435   CHOLHDL 2.7 04/07/2016 1435   VLDL 13 04/07/2016 1435   LDLCALC 33 04/07/2016 1435       Other studies Reviewed: Additional studies/ records that were reviewed  today include: ***.   ASSESSMENT AND PLAN:  1.  ***   Current medicines are reviewed with the patient today.  The patient Has no concerns regarding medicines.  The following changes have been made:  See above Labs/ tests ordered today include:see above  Disposition:   FU:  see above  Signed, Cecilie Kicks, NP  10/05/2016 10:40 PM    [place], [place], [address] [place], [place] Phone: [phone]; Fax: [phone]

## 2016-10-06 ENCOUNTER — Ambulatory Visit: Payer: Medicare Other | Admitting: Cardiology

## 2016-10-06 ENCOUNTER — Ambulatory Visit (INDEPENDENT_AMBULATORY_CARE_PROVIDER_SITE_OTHER): Payer: Medicare Other | Admitting: Cardiology

## 2016-10-06 ENCOUNTER — Encounter: Payer: Self-pay | Admitting: Cardiology

## 2016-10-06 VITALS — BP 178/82 | HR 71 | Ht 72.0 in | Wt 186.8 lb

## 2016-10-06 DIAGNOSIS — N186 End stage renal disease: Secondary | ICD-10-CM | POA: Diagnosis not present

## 2016-10-06 DIAGNOSIS — Z992 Dependence on renal dialysis: Secondary | ICD-10-CM

## 2016-10-06 DIAGNOSIS — Z4931 Encounter for adequacy testing for hemodialysis: Secondary | ICD-10-CM | POA: Diagnosis not present

## 2016-10-06 DIAGNOSIS — I351 Nonrheumatic aortic (valve) insufficiency: Secondary | ICD-10-CM

## 2016-10-06 DIAGNOSIS — D509 Iron deficiency anemia, unspecified: Secondary | ICD-10-CM | POA: Diagnosis not present

## 2016-10-06 DIAGNOSIS — R0609 Other forms of dyspnea: Secondary | ICD-10-CM | POA: Diagnosis not present

## 2016-10-06 DIAGNOSIS — D631 Anemia in chronic kidney disease: Secondary | ICD-10-CM | POA: Diagnosis not present

## 2016-10-06 DIAGNOSIS — B20 Human immunodeficiency virus [HIV] disease: Secondary | ICD-10-CM

## 2016-10-06 DIAGNOSIS — N2581 Secondary hyperparathyroidism of renal origin: Secondary | ICD-10-CM | POA: Diagnosis not present

## 2016-10-06 NOTE — Patient Instructions (Signed)
Your physician recommends that you continue on your current medications as directed. Please refer to the Current Medication list given to you today.  CONTINUE   WITH  CURRENT  TREATMENT PLAN WILL CALL PT  Thursday  WITH   PLAN OF CARE

## 2016-10-06 NOTE — Progress Notes (Signed)
Cardiology Office Note  NEW PATIENT VISIT   Date:  10/06/2016   ID:  Jeffrey [name], DOB 1955/11/22, MRN [id number]  PCP:  Jeffrey Ochs, MD  Cardiologist:  new    Chief Complaint  Patient presents with  . Aortic Insuffiency  . Shortness of Breath    pt states when walking he gets SOB a lot       History of Present Illness: Jeffrey [name] is a 61 y.o. male who presents for severe aortic regurgitation.    He has a history of end-stage renal disease on home hemodialysis, HIV with a CD4 count of 160 in November, chronic anemia, and hypertension.  Recently admitted in Dec. With SOB.   Due to hx of MSSA bacteremia from an unclear source in November associated with a worsening of his aortic insufficiency from mild to moderate over a period of only 4 months- while he was treated with 2 weeks of antibiotics for this bacteremia even though the intention was that he receive 6 weeks.   With Dec admit he underwent TEE to rule out endocarditis with plan to eval his aortic valve.   TEE revealed severe aortic regurgitation, no endocarditis.   Echo 04/09/16   EF 60-65%  Moderate LVH G1DD  AV mild AR  LA moderately dilated Echo 07/2016   EF 60-65% moderate LVH G2DD  AV with moderate Aortic regurgitation  LA severely dilated TEE  08/2016  EF 60-65%  Severe Aortic regurgitation , no thrombus in atrium no vegetation.   Today he states he has DOE, his wt is at dry wt and he  Takes care of himself.  No chest pain.  We reviewed his Aortic insuff. And it has progressed.  He is willing for surgery.  We discussed cardiac cath and he stated with renal transplant work up at [facility].  He was turned down as did not meet criteria with no vascular access to place kidney.     Past Medical History:  Diagnosis Date  . Anemia   . Anxiety   . Arthritis   . Asthma    per pt hx  . End stage renal disease on home HD 07/10/2011   Started HD in September 2012 at [facility] with a tunneled HD catheter, now on home HD with  NxtStage. Dialyzing through AVF L lower arm with buttonhole technique as of mid 2014. His brother does the HD treatments at home.  They are roommates for 23 years.  The brother works 3rd shift and gets off about 8am and then puts [name] on HD in the morning after getting home. Most of the time he does HD about 4 times a week, for about 4 hours per treatment. Cause of ESRD was HTN according to patient. He says he let his health go and ending up with complications, and that he didn't like seeing doctors in those days.  He says he was diagnosed with severe HTN when he lived in [place] in his 31's.   . Gout   . Hepatitis B carrier ([place])   . HIV infection (Jeffrey [name])   . Hypertension   . Hyperthyroidism   . Pneumonia   . Seizure ([place])   . Seizures ([place]) 06/02/2011   pt denies this  . Thrombocytopenia ([place])     Past Surgical History:  Procedure Laterality Date  . AV FISTULA PLACEMENT  06/02/11   Left radiocephalic AVF  . COLONOSCOPY    . OTHER SURGICAL HISTORY  removal temporary HD catheter   . REVISON OF ARTERIOVENOUS FISTULA Left 10/10/2015   Procedure: REVISON OF LEFT RADIOCEPHALIC ARTERIOVENOUS FISTULA;  Surgeon: Angelia Mould, MD;  Location: [facility];  Service: Vascular;  Laterality: Left;  . REVISON OF ARTERIOVENOUS FISTULA Left 02/07/2016   Procedure: REPAIR OF PSEUDO-ANEUREYSM OF LEFT ARM  ARTERIOVENOUS FISTULA;  Surgeon: Angelia Mould, MD;  Location: [facility];  Service: Vascular;  Laterality: Left;  . REVISON OF ARTERIOVENOUS FISTULA Left 2/68/3419   Procedure: PLICATION OF LEFT ARM RADIOCEPHALIC ARTERIOVENOUS FISTULA PSEUDOANEURYSM;  Surgeon: Angelia Mould, MD;  Location: [facility];  Service: Vascular;  Laterality: Left;  . TEE WITHOUT CARDIOVERSION N/A 09/11/2016   Procedure: TRANSESOPHAGEAL ECHOCARDIOGRAM (TEE);  Surgeon: Dorothy Spark, MD;  Location: [facility] ENDOSCOPY;  Service: Cardiovascular;  Laterality: N/A;     Current Outpatient Prescriptions  Medication Sig  Dispense Refill  . albuterol (PROVENTIL) (2.5 MG/3ML) 0.083% nebulizer solution Take 2.5 mg by nebulization every 6 (six) hours as needed for wheezing or shortness of breath.    Marland Kitchen amLODipine (NORVASC) 10 MG tablet Take 5 mg by mouth 2 (two) times daily.     . calcium acetate (PHOSLO) 667 MG capsule Take 667-2,001 mg by mouth See admin instructions. 1,334-2,001 mg three times a day with each meal and 667-1,334 mg with each snack    . carvedilol (COREG) 25 MG tablet Take 25 mg by mouth 2 (two) times daily with a meal.    . cloNIDine (CATAPRES) 0.1 MG tablet Take 0.1 mg by mouth as needed (as needed related to hypertension while on hemodialysis).   6  . COMBIVENT RESPIMAT 20-100 MCG/ACT AERS respimat INHALE 1 PUFF BY MOUTH EVERY 4 HOURS AS NEEDED FOR WHEEZING OR SHORTNESS OF BREATH 4 g 0  . EPIVIR HBV 5 MG/ML solution TAKE 5 ML BY MOUTH ONCE DAILY 240 mL 3  . ethyl chloride spray Apply 1 application topically daily as needed. At HD  12  . hydrALAZINE (APRESOLINE) 100 MG tablet Take 100 mg by mouth 3 (three) times daily.     . ISENTRESS 400 MG tablet TAKE 1 TABLET(400 MG) BY MOUTH TWICE DAILY 60 tablet 6  . multivitamin (RENA-VIT) TABS tablet Take 1 tablet by mouth daily.      Marland Kitchen oxyCODONE-acetaminophen (PERCOCET/ROXICET) 5-325 MG tablet Take 1 tablet by mouth every 8 (eight) hours as needed for moderate pain or severe pain. 10 tablet 0  . tenofovir (VIREAD) 300 MG tablet TAKE 1 TABLET BY MOUTH EVERY SATURDAY AFTER DIALYSIS 5 tablet 5  . traZODone (DESYREL) 50 MG tablet Take 50 mg by mouth at bedtime as needed for sleep.     No current facility-administered medications for this visit.     Allergies:   Lisinopril and Penicillins    Social History:  The patient  reports that he quit smoking about 13 months ago. His smoking use included Cigarettes. He has never used smokeless tobacco. He reports that he does not drink alcohol or use drugs.   Family History:  The patient's family history includes  Cancer in his father; Diabetes in his mother; Hypertension in his mother.    ROS:  General:no colds or fevers, no weight changes Skin:no rashes or ulcers HEENT:no blurred vision, no congestion CV:see HPI PUL:see HPI GI:no diarrhea constipation or melena, no indigestion GU:no hematuria, no dysuria MS:no joint pain, no claudication Neuro:no syncope, no lightheadedness Endo:no diabetes, no thyroid disease  Wt Readings from Last 3 Encounters:  10/06/16 186 lb 12.8 oz (  84.7 kg)  09/28/16 186 lb (84.4 kg)  09/16/16 192 lb 1.6 oz (87.1 kg)     PHYSICAL EXAM: VS:  BP (!) 178/82   Pulse 71   Ht 6' (1.829 m)   Wt 186 lb 12.8 oz (84.7 kg)   SpO2 92%   BMI 25.33 kg/m  , BMI Body mass index is 25.33 kg/m. General:Pleasant affect, NAD Skin:Warm and dry, brisk capillary refill HEENT:normocephalic, sclera clear, mucus membranes moist Neck:supple, no JVD, no bruits  Heart:S1S2 RRR with 2/6 diastolic murmur, 1/6 systolic murmur, no gallup, rub or click Lungs:clear without rales, rhonchi, or wheezes YBO:FBPZ, non tender, + BS, do not palpate liver spleen or masses Ext:no lower ext edema, 2+ pedal pulses, 2+ radial pulses Neuro:alert and oriented X 3, MAE, follows commands, + facial symmetry    EKG:  EKG is not ordered today. EKG from 12/.21/17 reviewed SR   Recent Labs: 06/10/2016: ALT 5 08/06/2016: Magnesium 1.9 09/09/2016: B Natriuretic Peptide 1,040.1 09/12/2016: BUN 22; Creatinine, Ser 4.27; Hemoglobin 8.0; Platelets 166; Potassium 3.5; Sodium 135    Lipid Panel    Component Value Date/Time   CHOL 73 (L) 04/07/2016 1435   TRIG 64 04/07/2016 1435   HDL 27 (L) 04/07/2016 1435   CHOLHDL 2.7 04/07/2016 1435   VLDL 13 04/07/2016 1435   LDLCALC 33 04/07/2016 1435       Other studies Reviewed: Additional studies/ records that were reviewed today include: . Echo 04/09/16 ------------------------------------------------------------------- Study Conclusions  - Left  ventricle: The cavity size was mildly dilated. Wall   thickness was increased in a pattern of moderate LVH. There was   mild focal basal hypertrophy of the septum. Systolic function was   normal. The estimated ejection fraction was in the range of 60%   to 65%. Wall motion was normal; there were no regional wall   motion abnormalities. Doppler parameters are consistent with   abnormal left ventricular relaxation (grade 1 diastolic   dysfunction). Doppler parameters are consistent with high   ventricular filling pressure. - Aortic valve: There was mild regurgitation. - Mitral valve: Calcified annulus. - Left atrium: The atrium was moderately dilated. - Pericardium, extracardiac: A trivial pericardial effusion was   identified.  Impressions:  - Definity used; normal LV systolic function; grade 1 diastolic   dysfunction with elevated LV filling pressure; moderate LVH with   proximal septal thickening; mild AI; moderate LAE.   ECHO  08/08/16 ------------------------------------------------------------------- Study Conclusions  - Left ventricle: The cavity size was normal. There was moderate   concentric hypertrophy. Systolic function was normal. The   estimated ejection fraction was in the range of 60% to 65%. Wall   motion was normal; there were no regional wall motion   abnormalities. Doppler parameters are consistent with   pseudonormal left ventricular relaxation (grade 2 diastolic   dysfunction). The E/e&' ratio is >15, suggesting elevated LV   filling pressure. - Aortic valve: Trileaflet. Sclerosis without stenosis. There was   moderate regurgitation. - Mitral valve: Mildly thickened leaflets . There was mild   regurgitation. - Left atrium: Severely dilated. - Right ventricle: The cavity size was mildly dilated. Systolic   function was normal. - Right atrium: The atrium was mildly dilated. - Inferior vena cava: The vessel was dilated. The respirophasic   diameter  changes were blunted (< 50%), consistent with elevated   central venous pressure.  Impressions:  - LVEF 60-65%, moderate LVH, normal wall motion, grade 2 DD,   elevated LV filling pressure,  aortic valve sclerosis with   moderate AI (vegetation cannot be excluded), mild MR with   thickened mitral leaflets, severe LAE, mild RAE, dilated RV with   normal function, dilated IVC, no pericardial effusion. Compared   to a prior study in 03/2016, the degree of AI is worse -   vegetation cannot be excluded. Consider TEE if suspicion for   endocarditis.   TEE 09/11/16 Study Conclusions  - Left ventricle: The cavity size was normal. Wall thickness was   normal. Systolic function was normal. The estimated ejection   fraction was in the range of 60% to 65%. - Aortic valve: There was severe regurgitation. - Mitral valve: No evidence of vegetation. There was mild   regurgitation. - Left atrium: The atrium was dilated. No evidence of thrombus in   the atrial cavity or appendage. No evidence of thrombus in the   atrial cavity or appendage. - Right atrium: No evidence of thrombus in the atrial cavity or   appendage. - Tricuspid valve: No evidence of vegetation. - Pulmonic valve: No evidence of vegetation.  Impressions:  - Severe aortic regurgitation. No vegetation.  Left ventricle:  The cavity size was normal. Wall thickness was normal. Systolic function was normal. The estimated ejection fraction was in the range of 60% to 65%.  ------------------------------------------------------------------- Aortic valve:   Doppler:  There was severe regurgitation.  ------------------------------------------------------------------- Aorta:  The aorta was normal, not dilated, and non-diseased.  ------------------------------------------------------------------- Mitral valve:   Structurally normal valve.   Leaflet separation was normal.  No evidence of vegetation.  Doppler:  There was  mild regurgitation.  ------------------------------------------------------------------- Left atrium:  The atrium was dilated.  No evidence of thrombus in the atrial cavity or appendage.  No evidence of thrombus in the atrial cavity or appendage.  ------------------------------------------------------------------- Right ventricle:  The cavity size was normal. Wall thickness was normal. Systolic function was normal.  ------------------------------------------------------------------- Pulmonic valve:    Structurally normal valve.   Cusp separation was normal.  No evidence of vegetation.  ------------------------------------------------------------------- Tricuspid valve:   Structurally normal valve.   Leaflet separation was normal.  No evidence of vegetation.  Doppler:  There was no regurgitation.  ------------------------------------------------------------------- Right atrium:  The atrium was normal in size.  No evidence of thrombus in the atrial cavity or appendage.  ------------------------------------------------------------------- Pericardium:  The pericardium was normal in appearance. There was no pericardial effusion.  ------------------------------------------------------------------- Post procedure conclusions Ascending Aorta:  - The aorta was normal, not dilated, and non-diseased.  ------------------------------------------------------------------- Measurements   Aortic valve                          Value  Aortic regurg pressure half-time      74    ms  Legend: (L)  and  (H)  mark values outside specified reference range.  ------------------------------------------------------------------- Prepared and Electronically Authenticated by  Ena Dawley, M.D. 2017-12-22T13:09:25   ASSESSMENT AND PLAN:  1.  Severe aortic regurgitation has increased since July.  Recent TEE without vegetation.  (done for increasing AR on TTE and recent MSSA  bacteremia)  Pt with DOE - he keeps his wt and volume controlled with home dialysis on MWF followed by Dr. Joelyn Oms. symptomatic with DOE  2. ESRD on HD at home MWF- dx 2012 with presentation with seizure and Cr of 23.  Also dx with HIV at hat time and HEP B.   3.  HIV followed by Dr. Johnnye Sima (risk factor acquisition was his known  HIV partner). Pt is treated.   HIV 1 RNA Quant is < 20.  4.  Hep B Dx in 2012  5.  Recent infection MSSA unknown source, follow up BCx were negative   He had MRI of lumbar spine that did not show infection     6.  HTN did not take meds yet for this afternoon, will take when he arrives home.  7. Anemia of chronic disease now on macera injections.  Last noted Hgb 8.0  Dr. Lovena Le saw pt and discussed plan for eval.  He will discuss with TCTS tomorrow and decide if he is a candidate for surgery.  If so then they may wish to see him first then rt and lt heart cath vs. Proceeding with cath.  Pt is willing to have surgery.  Dr. Lovena Le also discussed if surgery not an option- that medical therapy only helps to a point.     Pt's phne [phone] [place], [phone]  Current medicines are reviewed with the patient today.  The patient Has no concerns regarding medicines.  The following changes have been made:  See above Labs/ tests ordered today include:see above  Disposition:   FU:  see above  Signed, Cecilie Kicks, NP  10/06/2016 4:10 PM    [facility] [place], [address] [place], [place] Phone: [phone]; Fax: [phone]  EP Attending  Patient seen and examined. Agree with the findings as noted above. He appears to have surgical AI. I have discussed the case with Dr. Roxan Hockey. I have asked him to see the patient in the office and assess as to whether or not he is a surgical candidate due to his multiple comorbidities. If he is then we would plan left and  right heart cath with coronary angios.  Mikle Bosworth.D.

## 2016-10-08 ENCOUNTER — Ambulatory Visit: Payer: Medicare Other | Admitting: Cardiology

## 2016-10-08 DIAGNOSIS — N2581 Secondary hyperparathyroidism of renal origin: Secondary | ICD-10-CM | POA: Diagnosis not present

## 2016-10-08 DIAGNOSIS — Z4931 Encounter for adequacy testing for hemodialysis: Secondary | ICD-10-CM | POA: Diagnosis not present

## 2016-10-08 DIAGNOSIS — D509 Iron deficiency anemia, unspecified: Secondary | ICD-10-CM | POA: Diagnosis not present

## 2016-10-08 DIAGNOSIS — D631 Anemia in chronic kidney disease: Secondary | ICD-10-CM | POA: Diagnosis not present

## 2016-10-08 DIAGNOSIS — N186 End stage renal disease: Secondary | ICD-10-CM | POA: Diagnosis not present

## 2016-10-09 DIAGNOSIS — J449 Chronic obstructive pulmonary disease, unspecified: Secondary | ICD-10-CM | POA: Diagnosis not present

## 2016-10-09 DIAGNOSIS — M47817 Spondylosis without myelopathy or radiculopathy, lumbosacral region: Secondary | ICD-10-CM | POA: Diagnosis not present

## 2016-10-10 DIAGNOSIS — D509 Iron deficiency anemia, unspecified: Secondary | ICD-10-CM | POA: Diagnosis not present

## 2016-10-10 DIAGNOSIS — Z4931 Encounter for adequacy testing for hemodialysis: Secondary | ICD-10-CM | POA: Diagnosis not present

## 2016-10-10 DIAGNOSIS — N2581 Secondary hyperparathyroidism of renal origin: Secondary | ICD-10-CM | POA: Diagnosis not present

## 2016-10-10 DIAGNOSIS — N186 End stage renal disease: Secondary | ICD-10-CM | POA: Diagnosis not present

## 2016-10-10 DIAGNOSIS — D631 Anemia in chronic kidney disease: Secondary | ICD-10-CM | POA: Diagnosis not present

## 2016-10-12 ENCOUNTER — Telehealth: Payer: Self-pay | Admitting: Cardiology

## 2016-10-12 DIAGNOSIS — D509 Iron deficiency anemia, unspecified: Secondary | ICD-10-CM | POA: Diagnosis not present

## 2016-10-12 DIAGNOSIS — D631 Anemia in chronic kidney disease: Secondary | ICD-10-CM | POA: Diagnosis not present

## 2016-10-12 DIAGNOSIS — Z4931 Encounter for adequacy testing for hemodialysis: Secondary | ICD-10-CM | POA: Diagnosis not present

## 2016-10-12 DIAGNOSIS — N186 End stage renal disease: Secondary | ICD-10-CM | POA: Diagnosis not present

## 2016-10-12 DIAGNOSIS — N2581 Secondary hyperparathyroidism of renal origin: Secondary | ICD-10-CM | POA: Diagnosis not present

## 2016-10-12 NOTE — Telephone Encounter (Signed)
LMTCB for [name] 

## 2016-10-12 NOTE — Telephone Encounter (Signed)
Follow Up:    Pt's brother is calling,he said the pt have not heard anything else about the next step. He thinks the pt condition is getting worse. He said he would like to hear something today if possible.

## 2016-10-12 NOTE — Telephone Encounter (Signed)
Jeffrey [name], I saw this guy in the office the other day. I have reviewed the TEE. AI has gone from mild to moderate to severe overt 6 months. He has class 2 symptoms, approaching class 3 and has been stable other wise. He had MSSA bacteremia which may have nothing to do with his symptoms. He was treated with IV anti-biotics. He has ESRD and is on home HD. He is HIV positive. If you see him and think he would be a surgical candidate, we will have him in for left and right heart cath. However, if in your opinion he has too many comorbidities for heart surgery, then we will treat him medically.   GT

## 2016-10-12 NOTE — Telephone Encounter (Signed)
I spoke with pt's brother, [name].

## 2016-10-12 NOTE — Telephone Encounter (Signed)
Jeffrey [name]  I'll be happy to see him.   [name]

## 2016-10-12 NOTE — Telephone Encounter (Signed)
Jeffrey [name] is aware Dr Lovena Le is consulting with TCTS to decide if he is a candidate for surgery.  Jeffrey [name] is aware we will follow up with him once plan is in place.

## 2016-10-13 DIAGNOSIS — D631 Anemia in chronic kidney disease: Secondary | ICD-10-CM | POA: Diagnosis not present

## 2016-10-13 DIAGNOSIS — N2581 Secondary hyperparathyroidism of renal origin: Secondary | ICD-10-CM | POA: Diagnosis not present

## 2016-10-13 DIAGNOSIS — D509 Iron deficiency anemia, unspecified: Secondary | ICD-10-CM | POA: Diagnosis not present

## 2016-10-13 DIAGNOSIS — N186 End stage renal disease: Secondary | ICD-10-CM | POA: Diagnosis not present

## 2016-10-13 DIAGNOSIS — Z4931 Encounter for adequacy testing for hemodialysis: Secondary | ICD-10-CM | POA: Diagnosis not present

## 2016-10-14 ENCOUNTER — Telehealth: Payer: Self-pay | Admitting: *Deleted

## 2016-10-14 ENCOUNTER — Telehealth: Payer: Self-pay

## 2016-10-14 DIAGNOSIS — I351 Nonrheumatic aortic (valve) insufficiency: Secondary | ICD-10-CM

## 2016-10-14 NOTE — Telephone Encounter (Signed)
Patient has left several messages asking to speak with a nurse regarding a medication.  He does not name the medication in his message.  RN has returned each call, has had to leave a message each time.  RN asked patient that if he does call back and leaves another message at [facility], please name the medication he is asking about. Jeffrey [name]

## 2016-10-14 NOTE — Telephone Encounter (Signed)
Left message for Levonne Spiller, RN to call me about scheduling pt with Dr. Roxan Hockey. Ryan sent a staff message to left me know the request will be sent to the scheduler, we just need to put in the referral.  I entered the referral today.

## 2016-10-14 NOTE — Telephone Encounter (Signed)
Patients brother, [name], walked in this morning with questions about the patients path forward.  He said that they are waiting to hear from Dr Lovena Le on the patient's surgery.  I told him that I would pass on th Dr Tanna Furry nurse that you are anxious to hear from the Dr.   He expressed his thanks and understanding.

## 2016-10-14 NOTE — Telephone Encounter (Signed)
Please schedule an appointment with Dr. Roxan Hockey. GT

## 2016-10-15 DIAGNOSIS — N2581 Secondary hyperparathyroidism of renal origin: Secondary | ICD-10-CM | POA: Diagnosis not present

## 2016-10-15 DIAGNOSIS — D509 Iron deficiency anemia, unspecified: Secondary | ICD-10-CM | POA: Diagnosis not present

## 2016-10-15 DIAGNOSIS — J449 Chronic obstructive pulmonary disease, unspecified: Secondary | ICD-10-CM | POA: Diagnosis not present

## 2016-10-15 DIAGNOSIS — N186 End stage renal disease: Secondary | ICD-10-CM | POA: Diagnosis not present

## 2016-10-15 DIAGNOSIS — D631 Anemia in chronic kidney disease: Secondary | ICD-10-CM | POA: Diagnosis not present

## 2016-10-15 DIAGNOSIS — M47817 Spondylosis without myelopathy or radiculopathy, lumbosacral region: Secondary | ICD-10-CM | POA: Diagnosis not present

## 2016-10-15 DIAGNOSIS — Z4931 Encounter for adequacy testing for hemodialysis: Secondary | ICD-10-CM | POA: Diagnosis not present

## 2016-10-15 NOTE — Telephone Encounter (Signed)
Called, spoke with pt. Informed Dr. Lovena Le referred pt to Dr. Roxan Hockey. Gave address and phone number to pt. Informed someone from their office should be contacting to schedule. Pt verbalized understanding and thanked me for calling.

## 2016-10-16 DIAGNOSIS — J449 Chronic obstructive pulmonary disease, unspecified: Secondary | ICD-10-CM | POA: Diagnosis not present

## 2016-10-16 DIAGNOSIS — M47817 Spondylosis without myelopathy or radiculopathy, lumbosacral region: Secondary | ICD-10-CM | POA: Diagnosis not present

## 2016-10-17 DIAGNOSIS — Z4931 Encounter for adequacy testing for hemodialysis: Secondary | ICD-10-CM | POA: Diagnosis not present

## 2016-10-17 DIAGNOSIS — D631 Anemia in chronic kidney disease: Secondary | ICD-10-CM | POA: Diagnosis not present

## 2016-10-17 DIAGNOSIS — N2581 Secondary hyperparathyroidism of renal origin: Secondary | ICD-10-CM | POA: Diagnosis not present

## 2016-10-17 DIAGNOSIS — D509 Iron deficiency anemia, unspecified: Secondary | ICD-10-CM | POA: Diagnosis not present

## 2016-10-17 DIAGNOSIS — N186 End stage renal disease: Secondary | ICD-10-CM | POA: Diagnosis not present

## 2016-10-18 DIAGNOSIS — N186 End stage renal disease: Secondary | ICD-10-CM | POA: Diagnosis not present

## 2016-10-18 DIAGNOSIS — N2581 Secondary hyperparathyroidism of renal origin: Secondary | ICD-10-CM | POA: Diagnosis not present

## 2016-10-18 DIAGNOSIS — D631 Anemia in chronic kidney disease: Secondary | ICD-10-CM | POA: Diagnosis not present

## 2016-10-18 DIAGNOSIS — Z4931 Encounter for adequacy testing for hemodialysis: Secondary | ICD-10-CM | POA: Diagnosis not present

## 2016-10-18 DIAGNOSIS — D509 Iron deficiency anemia, unspecified: Secondary | ICD-10-CM | POA: Diagnosis not present

## 2016-10-20 DIAGNOSIS — Z4931 Encounter for adequacy testing for hemodialysis: Secondary | ICD-10-CM | POA: Diagnosis not present

## 2016-10-20 DIAGNOSIS — D631 Anemia in chronic kidney disease: Secondary | ICD-10-CM | POA: Diagnosis not present

## 2016-10-20 DIAGNOSIS — N186 End stage renal disease: Secondary | ICD-10-CM | POA: Diagnosis not present

## 2016-10-20 DIAGNOSIS — N2581 Secondary hyperparathyroidism of renal origin: Secondary | ICD-10-CM | POA: Diagnosis not present

## 2016-10-20 DIAGNOSIS — D509 Iron deficiency anemia, unspecified: Secondary | ICD-10-CM | POA: Diagnosis not present

## 2016-10-21 DIAGNOSIS — Z992 Dependence on renal dialysis: Secondary | ICD-10-CM | POA: Diagnosis not present

## 2016-10-21 DIAGNOSIS — I120 Hypertensive chronic kidney disease with stage 5 chronic kidney disease or end stage renal disease: Secondary | ICD-10-CM | POA: Diagnosis not present

## 2016-10-21 DIAGNOSIS — N186 End stage renal disease: Secondary | ICD-10-CM | POA: Diagnosis not present

## 2016-10-22 DIAGNOSIS — E8779 Other fluid overload: Secondary | ICD-10-CM | POA: Diagnosis not present

## 2016-10-22 DIAGNOSIS — D509 Iron deficiency anemia, unspecified: Secondary | ICD-10-CM | POA: Diagnosis not present

## 2016-10-22 DIAGNOSIS — N2581 Secondary hyperparathyroidism of renal origin: Secondary | ICD-10-CM | POA: Diagnosis not present

## 2016-10-22 DIAGNOSIS — E213 Hyperparathyroidism, unspecified: Secondary | ICD-10-CM | POA: Diagnosis not present

## 2016-10-22 DIAGNOSIS — D631 Anemia in chronic kidney disease: Secondary | ICD-10-CM | POA: Diagnosis not present

## 2016-10-22 DIAGNOSIS — Z79899 Other long term (current) drug therapy: Secondary | ICD-10-CM | POA: Diagnosis not present

## 2016-10-22 DIAGNOSIS — N186 End stage renal disease: Secondary | ICD-10-CM | POA: Diagnosis not present

## 2016-10-24 DIAGNOSIS — Z79899 Other long term (current) drug therapy: Secondary | ICD-10-CM | POA: Diagnosis not present

## 2016-10-24 DIAGNOSIS — D509 Iron deficiency anemia, unspecified: Secondary | ICD-10-CM | POA: Diagnosis not present

## 2016-10-24 DIAGNOSIS — E8779 Other fluid overload: Secondary | ICD-10-CM | POA: Diagnosis not present

## 2016-10-24 DIAGNOSIS — D631 Anemia in chronic kidney disease: Secondary | ICD-10-CM | POA: Diagnosis not present

## 2016-10-24 DIAGNOSIS — N186 End stage renal disease: Secondary | ICD-10-CM | POA: Diagnosis not present

## 2016-10-24 DIAGNOSIS — E213 Hyperparathyroidism, unspecified: Secondary | ICD-10-CM | POA: Diagnosis not present

## 2016-10-25 DIAGNOSIS — N186 End stage renal disease: Secondary | ICD-10-CM | POA: Diagnosis not present

## 2016-10-25 DIAGNOSIS — E8779 Other fluid overload: Secondary | ICD-10-CM | POA: Diagnosis not present

## 2016-10-25 DIAGNOSIS — Z79899 Other long term (current) drug therapy: Secondary | ICD-10-CM | POA: Diagnosis not present

## 2016-10-25 DIAGNOSIS — D631 Anemia in chronic kidney disease: Secondary | ICD-10-CM | POA: Diagnosis not present

## 2016-10-25 DIAGNOSIS — D509 Iron deficiency anemia, unspecified: Secondary | ICD-10-CM | POA: Diagnosis not present

## 2016-10-25 DIAGNOSIS — E213 Hyperparathyroidism, unspecified: Secondary | ICD-10-CM | POA: Diagnosis not present

## 2016-10-26 MED FILL — ETHYL CHLORIDE SPRAY: 25 days supply | Qty: 104 | Fill #0

## 2016-10-27 DIAGNOSIS — E8779 Other fluid overload: Secondary | ICD-10-CM | POA: Diagnosis not present

## 2016-10-27 DIAGNOSIS — D631 Anemia in chronic kidney disease: Secondary | ICD-10-CM | POA: Diagnosis not present

## 2016-10-27 DIAGNOSIS — N186 End stage renal disease: Secondary | ICD-10-CM | POA: Diagnosis not present

## 2016-10-27 DIAGNOSIS — D509 Iron deficiency anemia, unspecified: Secondary | ICD-10-CM | POA: Diagnosis not present

## 2016-10-27 DIAGNOSIS — E213 Hyperparathyroidism, unspecified: Secondary | ICD-10-CM | POA: Diagnosis not present

## 2016-10-27 DIAGNOSIS — Z79899 Other long term (current) drug therapy: Secondary | ICD-10-CM | POA: Diagnosis not present

## 2016-10-27 DIAGNOSIS — M109 Gout, unspecified: Secondary | ICD-10-CM | POA: Diagnosis not present

## 2016-10-28 ENCOUNTER — Institutional Professional Consult (permissible substitution) (INDEPENDENT_AMBULATORY_CARE_PROVIDER_SITE_OTHER): Payer: Medicare Other | Admitting: Thoracic Surgery (Cardiothoracic Vascular Surgery)

## 2016-10-28 ENCOUNTER — Encounter: Payer: Self-pay | Admitting: Thoracic Surgery (Cardiothoracic Vascular Surgery)

## 2016-10-28 VITALS — BP 180/83 | HR 60 | Resp 20 | Ht 72.0 in | Wt 186.0 lb

## 2016-10-28 DIAGNOSIS — I351 Nonrheumatic aortic (valve) insufficiency: Secondary | ICD-10-CM | POA: Diagnosis not present

## 2016-10-28 NOTE — Progress Notes (Signed)
PCP is Velna Ochs, MD Referring Provider is Evans Lance, MD  Chief Complaint  Patient presents with  . Aortic Insuffiency    Surgical eval, TEE 09/11/16,    HPI: Mr. Heffner is sent for consultation regarding aortic insufficiency.  Mr. Argabright is a 61 year old man with a history of end-stage renal disease on dialysis since 2012, asthma, arthritis, anemia of chronic disease, gout, hypertension, hyperthyroidism, HIV, hepatitis B, seizures, hyperthyroidism, and thrombocytopenia. He also has peripheral arterial disease and was evaluated for renal transplant at [facility] but turned down due to the calcification in his vessels.  Mr. Buda was hospitalized in November with severe low back pain and was found to have a methicillin sensitive staph bacteremia. He was treated with Ancef intravenously for 2 weeks.Marland Kitchen  He was admitted to [facility] on 09/09/2016 with shortness of breath and worsening anemia. He was treated with hemodialysis and blood transfusions and his symptoms improved. During his hospitalization he had a transesophageal echocardiogram which showed severe aortic insufficiency, but no definite vegetation. Left ventricular function was preserved. There was mild MR. Blood cultures were negative.  He says he gets short of breath with walking in the parking lot to the lobby approximately 100 feet. Sometimes feels short of breath when he first lies down. His shortness of breath is variable and somewhat dependent on his dialysis day. He continues to do home dialysis with his brother doing the access. He is not having any chest pain, pressure, or tightness.  Past Medical History:  Diagnosis Date  . Anemia   . Anxiety   . Arthritis   . Asthma    per pt hx  . End stage renal disease on home HD 07/10/2011   Started HD in September 2012 at [facility] with a tunneled HD catheter, now on home HD with NxtStage. Dialyzing through AVF L lower arm with buttonhole technique as of mid 2014. His brother does the HD  treatments at home.  They are roommates for 23 years.  The brother works 3rd shift and gets off about 8am and then puts Mr Komar on HD in the morning after getting home. Most of the time he does HD about 4 times a week, for about 4 hours per treatment. Cause of ESRD was HTN according to patient. He says he let his health go and ending up with complications, and that he didn't like seeing doctors in those days.  He says he was diagnosed with severe HTN when he lived in [place] in his 57's.   . Gout   . Hepatitis B carrier ([place])   . HIV infection ([place])   . Hypertension   . Hyperthyroidism   . Pneumonia   . Seizure ([place])   . Seizures ([place]) 06/02/2011   pt denies this  . Thrombocytopenia ([place])     Past Surgical History:  Procedure Laterality Date  . AV FISTULA PLACEMENT  06/02/11   Left radiocephalic AVF  . COLONOSCOPY    . OTHER SURGICAL HISTORY     removal temporary HD catheter   . REVISON OF ARTERIOVENOUS FISTULA Left 10/10/2015   Procedure: REVISON OF LEFT RADIOCEPHALIC ARTERIOVENOUS FISTULA;  Surgeon: Angelia Mould, MD;  Location: [place];  Service: Vascular;  Laterality: Left;  . REVISON OF ARTERIOVENOUS FISTULA Left 02/07/2016   Procedure: REPAIR OF PSEUDO-ANEUREYSM OF LEFT ARM  ARTERIOVENOUS FISTULA;  Surgeon: Angelia Mould, MD;  Location: [place];  Service: Vascular;  Laterality: Left;  . REVISON OF ARTERIOVENOUS FISTULA Left 04/10/2016  Procedure: PLICATION OF LEFT ARM RADIOCEPHALIC ARTERIOVENOUS FISTULA PSEUDOANEURYSM;  Surgeon: Angelia Mould, MD;  Location: [place];  Service: Vascular;  Laterality: Left;  . TEE WITHOUT CARDIOVERSION N/A 09/11/2016   Procedure: TRANSESOPHAGEAL ECHOCARDIOGRAM (TEE);  Surgeon: Dorothy Spark, MD;  Location: [facility] ENDOSCOPY;  Service: Cardiovascular;  Laterality: N/A;    Family History  Problem Relation Age of Onset  . Hypertension Mother   . Diabetes Mother   . Cancer Father     Social History Social History  Substance  Use Topics  . Smoking status: Former Smoker    Types: Cigarettes    Quit date: 08/08/2015  . Smokeless tobacco: Never Used  . Alcohol use No    Current Outpatient Prescriptions  Medication Sig Dispense Refill  . albuterol (PROVENTIL) (2.5 MG/3ML) 0.083% nebulizer solution Take 2.5 mg by nebulization every 6 (six) hours as needed for wheezing or shortness of breath.    Marland Kitchen amLODipine (NORVASC) 10 MG tablet Take 5 mg by mouth 2 (two) times daily.     . calcium acetate (PHOSLO) 667 MG capsule Take 667-2,001 mg by mouth See admin instructions. 1,334-2,001 mg three times a day with each meal and 667-1,334 mg with each snack    . carvedilol (COREG) 25 MG tablet Take 25 mg by mouth 2 (two) times daily with a meal.    . cloNIDine (CATAPRES) 0.1 MG tablet Take 0.1 mg by mouth as needed (as needed related to hypertension while on hemodialysis).   6  . COMBIVENT RESPIMAT 20-100 MCG/ACT AERS respimat INHALE 1 PUFF BY MOUTH EVERY 4 HOURS AS NEEDED FOR WHEEZING OR SHORTNESS OF BREATH 4 g 0  . EPIVIR HBV 5 MG/ML solution TAKE 5 ML BY MOUTH ONCE DAILY 240 mL 3  . ethyl chloride spray Apply 1 application topically daily as needed. At HD  12  . hydrALAZINE (APRESOLINE) 100 MG tablet Take 100 mg by mouth 3 (three) times daily.     . ISENTRESS 400 MG tablet TAKE 1 TABLET(400 MG) BY MOUTH TWICE DAILY 60 tablet 6  . multivitamin (RENA-VIT) TABS tablet Take 1 tablet by mouth daily.      Marland Kitchen oxyCODONE-acetaminophen (PERCOCET/ROXICET) 5-325 MG tablet Take 1 tablet by mouth every 8 (eight) hours as needed for moderate pain or severe pain. 10 tablet 0  . tenofovir (VIREAD) 300 MG tablet TAKE 1 TABLET BY MOUTH EVERY SATURDAY AFTER DIALYSIS 5 tablet 5  . traZODone (DESYREL) 50 MG tablet Take 50 mg by mouth at bedtime as needed for sleep.     No current facility-administered medications for this visit.     Allergies  Allergen Reactions  . Lisinopril Shortness Of Breath and Swelling    Throat swelling  . Penicillins  Other (See Comments)    Childhood allergy Has patient had a PCN reaction causing immediate rash, facial/tongue/throat swelling, SOB or lightheadedness with hypotension: Unk Has patient had a PCN reaction causing severe rash involving mucus membranes or skin necrosis: Unk Has patient had a PCN reaction that required hospitalization: Unk Has patient had a PCN reaction occurring within the last 10 years: No If all of the above answers are "NO", then may proceed with Cephalosporin use.     Review of Systems  Constitutional: Positive for activity change. Negative for chills, fever and unexpected weight change.  HENT: Positive for dental problem (dentures). Negative for trouble swallowing and voice change.   Respiratory: Positive for shortness of breath (with exertion). Negative for wheezing.   Cardiovascular: Negative for  chest pain, palpitations and leg swelling.  Gastrointestinal: Negative for abdominal pain and blood in stool.  Genitourinary:       ESRD on HD  Musculoskeletal: Positive for arthralgias and back pain.  Neurological: Positive for seizures (None recently). Negative for syncope.  Hematological: Negative for adenopathy. Does not bruise/bleed easily.  All other systems reviewed and are negative.   BP (!) 180/83   Pulse 60   Resp 20   Ht 6' (1.829 m)   Wt 186 lb (84.4 kg)   SpO2 99% Comment: RA  BMI 25.23 kg/m  Physical Exam  Constitutional: He is oriented to person, place, and time. He appears well-developed. No distress.  HENT:  Head: Normocephalic and atraumatic.  Mouth/Throat: No oropharyngeal exudate.  Eyes: Conjunctivae and EOM are normal. No scleral icterus.  Neck: Neck supple. No JVD present. No thyromegaly present.  Cardiovascular: Normal rate and regular rhythm.   Murmur (3/6 diastolic murmur right upper sternal border) heard. Pulmonary/Chest: Effort normal. No respiratory distress. He has no wheezes. He has rales (Faint basilar).  Abdominal: Soft. He  exhibits no distension. There is no tenderness.  Musculoskeletal: He exhibits no edema.  Fistula left arm with good thrill  Lymphadenopathy:    He has no cervical adenopathy.  Neurological: He is alert and oriented to person, place, and time. No cranial nerve deficit. He exhibits normal muscle tone.  Skin: Skin is warm and dry.  Psychiatric: He has a normal mood and affect.  Vitals reviewed.    Diagnostic Tests:  Transthoracic Echo 08/08/2016 Study Conclusions  - Left ventricle: The cavity size was normal. There was moderate   concentric hypertrophy. Systolic function was normal. The   estimated ejection fraction was in the range of 60% to 65%. Wall   motion was normal; there were no regional wall motion   abnormalities. Doppler parameters are consistent with   pseudonormal left ventricular relaxation (grade 2 diastolic   dysfunction). The E/e&' ratio is >15, suggesting elevated LV   filling pressure. - Aortic valve: Trileaflet. Sclerosis without stenosis. There was   moderate regurgitation. - Mitral valve: Mildly thickened leaflets . There was mild   regurgitation. - Left atrium: Severely dilated. - Right ventricle: The cavity size was mildly dilated. Systolic   function was normal. - Right atrium: The atrium was mildly dilated. - Inferior vena cava: The vessel was dilated. The respirophasic   diameter changes were blunted (< 50%), consistent with elevated   central venous pressure.  Impressions:  - LVEF 60-65%, moderate LVH, normal wall motion, grade 2 DD,   elevated LV filling pressure, aortic valve sclerosis with   moderate AI (vegetation cannot be excluded), mild MR with   thickened mitral leaflets, severe LAE, mild RAE, dilated RV with   normal function, dilated IVC, no pericardial effusion. Compared   to a prior study in 03/2016, the degree of AI is worse -   vegetation cannot be excluded. Consider TEE if suspicion for   endocarditis.  Transesophageal  Echocardiogram Note  ARSENIY TOOMEY [id number] 1955/12/08  Procedure: Transesophageal Echocardiogram Indications: MSSA bacteremia  Procedure Details Consent: Obtained Time Out: Verified patient identification, verified procedure, site/side was marked, verified correct patient position, special equipment/implants available, Radiology Safety Procedures followed,  medications/allergies/relevent history reviewed, required imaging and test results available.  Performed  Medications: Fentanyl: 50 mcg Versed: 7 mg  Normal LVEF. Severe AI. No endocarditis.  Complications: No apparent complications Patient did tolerate procedure well.  Ena Dawley, MD, [facility] 09/11/2016, 1:03 PM  I personally reviewed the TEE images and concur with the findings noted above.  Impression: Mr. Dombek is a 61 year old gentleman with multiple medical problems including end-stage renal disease on dialysis, hypertension, hyperlipidemia, hyperthyroidism, peripheral arterial disease, HIV, hepatitis B, asthma, and chronic back pain. He has severe aortic insufficiency by echo criteria. He has class II congestive failure symptoms.  I was asked to see him to determine whether he would potentially be a candidate for aortic valve replacement. He would be high risk due to his comorbidities but the risk is not prohibitive and he is a surgical candidate.  I do not think there is any urgency to his aortic valve replacement as his symptoms are relatively mild and seemed to be improved with lowering his dry weight with hemodialysis. That being said, his symptoms may be more difficult to control going forward due to his renal failure and intermittent nature of dialysis.  I long discussion with Mr. Dohrmann and his brother. I informed them of the general nature of the operation, the need for cardiopulmonary bypass, the incision to be used, and the general conduct of the procedure. We discussed the expected hospital stay and  overall recovery. I informed him of the indications, risks, benefits, and alternatives. He understands the risks include, but are not limited to death, MI, DVT, PE, bleeding, possible need for transfusion, infection, heart block requiring pacemaker, respiratory failure, cardiac arrhythmias, gastrointestinal, complications, as well as possibility of other unforeseeable complications.  We also discussed which type of valve to use, mechanical versus tissue. I informed him of the relative advantages and disadvantages of each of those options. Certainly the advantage of the tissue valve is that he'll need for lifelong anticoagulation. The disadvantage is the potential for early calcification and failure.   I advised him to take his time and think over these options. Should he decide that he would want to proceed we will arrange for cardiac catheterization with Dr. Lovena Le.    [name], MD [facility] [phone]

## 2016-10-29 DIAGNOSIS — D631 Anemia in chronic kidney disease: Secondary | ICD-10-CM | POA: Diagnosis not present

## 2016-10-29 DIAGNOSIS — Z79899 Other long term (current) drug therapy: Secondary | ICD-10-CM | POA: Diagnosis not present

## 2016-10-29 DIAGNOSIS — E213 Hyperparathyroidism, unspecified: Secondary | ICD-10-CM | POA: Diagnosis not present

## 2016-10-29 DIAGNOSIS — N186 End stage renal disease: Secondary | ICD-10-CM | POA: Diagnosis not present

## 2016-10-29 DIAGNOSIS — D509 Iron deficiency anemia, unspecified: Secondary | ICD-10-CM | POA: Diagnosis not present

## 2016-10-29 DIAGNOSIS — E8779 Other fluid overload: Secondary | ICD-10-CM | POA: Diagnosis not present

## 2016-10-30 DIAGNOSIS — M47817 Spondylosis without myelopathy or radiculopathy, lumbosacral region: Secondary | ICD-10-CM | POA: Diagnosis not present

## 2016-10-30 DIAGNOSIS — J449 Chronic obstructive pulmonary disease, unspecified: Secondary | ICD-10-CM | POA: Diagnosis not present

## 2016-10-31 DIAGNOSIS — E213 Hyperparathyroidism, unspecified: Secondary | ICD-10-CM | POA: Diagnosis not present

## 2016-10-31 DIAGNOSIS — E8779 Other fluid overload: Secondary | ICD-10-CM | POA: Diagnosis not present

## 2016-10-31 DIAGNOSIS — D631 Anemia in chronic kidney disease: Secondary | ICD-10-CM | POA: Diagnosis not present

## 2016-10-31 DIAGNOSIS — Z79899 Other long term (current) drug therapy: Secondary | ICD-10-CM | POA: Diagnosis not present

## 2016-10-31 DIAGNOSIS — D509 Iron deficiency anemia, unspecified: Secondary | ICD-10-CM | POA: Diagnosis not present

## 2016-10-31 DIAGNOSIS — N186 End stage renal disease: Secondary | ICD-10-CM | POA: Diagnosis not present

## 2016-10-31 NOTE — Assessment & Plan Note (Deleted)
Follows with Dr. Johnnye Sima. CD4 count one month ago was 250, viral load undetectable.

## 2016-10-31 NOTE — Progress Notes (Deleted)
   CC: ***  HPI:  Mr.Tylerjames Jerilynn Mages Kunin is a 61 y.o. M with multiple medical problems outlined below here for follow up ***   Past Medical History:  Diagnosis Date  . Anemia   . Anxiety   . Arthritis   . Asthma    per pt hx  . End stage renal disease on home HD 07/10/2011   Started HD in September 2012 at [facility] with a tunneled HD catheter, now on home HD with NxtStage. Dialyzing through AVF L lower arm with buttonhole technique as of mid 2014. His brother does the HD treatments at home.  They are roommates for 23 years.  The brother works 3rd shift and gets off about 8am and then puts Mr Rosenow on HD in the morning after getting home. Most of the time he does HD about 4 times a week, for about 4 hours per treatment. Cause of ESRD was HTN according to patient. He says he let his health go and ending up with complications, and that he didn't like seeing doctors in those days.  He says he was diagnosed with severe HTN when he lived in [place] in his 24's.   . Gout   . Hepatitis B carrier ([place])   . HIV infection ([place])   . Hypertension   . Hyperthyroidism   . Pneumonia   . Seizure ([place])   . Seizures ([place]) 06/02/2011   pt denies this  . Thrombocytopenia ([place])     Review of Systems:  All pertinents listed in HPI, otherwise negative  Physical Exam:  There were no vitals filed for this visit.  Constitutional: NAD, appears comfortable HEENT: Atraumatic, normocephalic. PERRL, anicteric sclera.  Neck: Supple, trachea midline.  Cardiovascular: RRR, no murmurs, rubs, or gallops.  Pulmonary/Chest: CTAB, no wheezes, rales, or rhonchi. No chest wall abnormalities.  Abdominal: Soft, non tender, non distended. +BS.  GU: ***  Extremities: Warm and well perfused. Distal pulses intact. No edema.  Neurological: A&Ox3, CN II - XII grossly intact.  Skin: No rashes or erythema  Psychiatric: Normal mood and affect  Assessment & Plan:   See Encounters Tab for problem based charting.  Patient  {GC/GE:[id number]::"discussed with","seen with"} Dr. {NAMES:[id number]::"Butcher","Granfortuna","E. Hoffman","Klima","Mullen","Narendra","Vincent"}

## 2016-10-31 NOTE — Assessment & Plan Note (Deleted)
Patient takes amlodipine 10 mg, Hydralazine 100 mg TID, and clonidine 0.1 prn. BP today

## 2016-10-31 NOTE — Assessment & Plan Note (Deleted)
SOB

## 2016-11-01 DIAGNOSIS — N186 End stage renal disease: Secondary | ICD-10-CM | POA: Diagnosis not present

## 2016-11-01 DIAGNOSIS — D509 Iron deficiency anemia, unspecified: Secondary | ICD-10-CM | POA: Diagnosis not present

## 2016-11-01 DIAGNOSIS — E8779 Other fluid overload: Secondary | ICD-10-CM | POA: Diagnosis not present

## 2016-11-01 DIAGNOSIS — E213 Hyperparathyroidism, unspecified: Secondary | ICD-10-CM | POA: Diagnosis not present

## 2016-11-01 DIAGNOSIS — D631 Anemia in chronic kidney disease: Secondary | ICD-10-CM | POA: Diagnosis not present

## 2016-11-01 DIAGNOSIS — Z79899 Other long term (current) drug therapy: Secondary | ICD-10-CM | POA: Diagnosis not present

## 2016-11-02 ENCOUNTER — Encounter: Payer: Medicare Other | Admitting: Internal Medicine

## 2016-11-02 DIAGNOSIS — D631 Anemia in chronic kidney disease: Secondary | ICD-10-CM | POA: Diagnosis not present

## 2016-11-02 DIAGNOSIS — E213 Hyperparathyroidism, unspecified: Secondary | ICD-10-CM | POA: Diagnosis not present

## 2016-11-02 DIAGNOSIS — N186 End stage renal disease: Secondary | ICD-10-CM | POA: Diagnosis not present

## 2016-11-02 DIAGNOSIS — D509 Iron deficiency anemia, unspecified: Secondary | ICD-10-CM | POA: Diagnosis not present

## 2016-11-02 DIAGNOSIS — E8779 Other fluid overload: Secondary | ICD-10-CM | POA: Diagnosis not present

## 2016-11-02 DIAGNOSIS — Z79899 Other long term (current) drug therapy: Secondary | ICD-10-CM | POA: Diagnosis not present

## 2016-11-03 DIAGNOSIS — D631 Anemia in chronic kidney disease: Secondary | ICD-10-CM | POA: Diagnosis not present

## 2016-11-03 DIAGNOSIS — N186 End stage renal disease: Secondary | ICD-10-CM | POA: Diagnosis not present

## 2016-11-03 DIAGNOSIS — E213 Hyperparathyroidism, unspecified: Secondary | ICD-10-CM | POA: Diagnosis not present

## 2016-11-03 DIAGNOSIS — E8779 Other fluid overload: Secondary | ICD-10-CM | POA: Diagnosis not present

## 2016-11-03 DIAGNOSIS — Z79899 Other long term (current) drug therapy: Secondary | ICD-10-CM | POA: Diagnosis not present

## 2016-11-03 DIAGNOSIS — D509 Iron deficiency anemia, unspecified: Secondary | ICD-10-CM | POA: Diagnosis not present

## 2016-11-04 ENCOUNTER — Telehealth: Payer: Self-pay | Admitting: Hematology

## 2016-11-04 ENCOUNTER — Telehealth: Payer: Self-pay | Admitting: *Deleted

## 2016-11-04 DIAGNOSIS — N186 End stage renal disease: Secondary | ICD-10-CM | POA: Diagnosis not present

## 2016-11-04 DIAGNOSIS — E8779 Other fluid overload: Secondary | ICD-10-CM | POA: Diagnosis not present

## 2016-11-04 DIAGNOSIS — D631 Anemia in chronic kidney disease: Secondary | ICD-10-CM | POA: Diagnosis not present

## 2016-11-04 DIAGNOSIS — D509 Iron deficiency anemia, unspecified: Secondary | ICD-10-CM | POA: Diagnosis not present

## 2016-11-04 DIAGNOSIS — Z79899 Other long term (current) drug therapy: Secondary | ICD-10-CM | POA: Diagnosis not present

## 2016-11-04 DIAGNOSIS — E213 Hyperparathyroidism, unspecified: Secondary | ICD-10-CM | POA: Diagnosis not present

## 2016-11-04 NOTE — Telephone Encounter (Signed)
Pt cld to reschedule appt to a Monday 2/19 at 830am w/[place]. Aware to arrive at 8am.

## 2016-11-04 NOTE — Telephone Encounter (Signed)
Received a call from [place] at infectious disease to schedule the pt an appt. Pt has an appt scheduled w/Dr. Irene Limbo on 2/15 at [place] aware that the pt needs to be here at 1030am in order to be checked in on time. Voiced understanding and will notify the pt of the appt.

## 2016-11-04 NOTE — Telephone Encounter (Signed)
Spoke with patient, explained the reason for the referral.  Patient unable to be at appointment tomorrow with Dr. Irene Limbo at [facility] - he was given the phone number to reschedule, advised to call today.  RN to fax most recent lab work from nephrology to [phone], attention Seth Bake. Landis Gandy, RN

## 2016-11-04 NOTE — Telephone Encounter (Signed)
RN called patient to notify him of the referral and appointment.  He will be at a funeral in [facility] tomorrow. RN gave patient the phone number to reschedule [phone].  He will call today. Landis Gandy, RN

## 2016-11-04 NOTE — Telephone Encounter (Signed)
Patient called regarding labs that were sent here from nephrology. He had a low wbc of 2 and stated he was told by Dr. Joelyn Oms that his ID Dr. Should take care of this. He requested a sooner appt to discuss with Dr. Johnnye Sima and gave him an appt for tomorrow. Dr. Johnnye Sima has reviewed this lab and recommended a hematology referral. Patient was upset because he said he did not get a timely call back on these results from "anyone". Harvin Hazel RN to discuss with MD.

## 2016-11-05 ENCOUNTER — Ambulatory Visit: Payer: Medicare Other | Admitting: Infectious Diseases

## 2016-11-05 ENCOUNTER — Encounter: Payer: Medicare Other | Admitting: Hematology

## 2016-11-05 DIAGNOSIS — E213 Hyperparathyroidism, unspecified: Secondary | ICD-10-CM | POA: Diagnosis not present

## 2016-11-05 DIAGNOSIS — D631 Anemia in chronic kidney disease: Secondary | ICD-10-CM | POA: Diagnosis not present

## 2016-11-05 DIAGNOSIS — D509 Iron deficiency anemia, unspecified: Secondary | ICD-10-CM | POA: Diagnosis not present

## 2016-11-05 DIAGNOSIS — E8779 Other fluid overload: Secondary | ICD-10-CM | POA: Diagnosis not present

## 2016-11-05 DIAGNOSIS — N186 End stage renal disease: Secondary | ICD-10-CM | POA: Diagnosis not present

## 2016-11-05 DIAGNOSIS — Z79899 Other long term (current) drug therapy: Secondary | ICD-10-CM | POA: Diagnosis not present

## 2016-11-07 DIAGNOSIS — E8779 Other fluid overload: Secondary | ICD-10-CM | POA: Diagnosis not present

## 2016-11-07 DIAGNOSIS — D631 Anemia in chronic kidney disease: Secondary | ICD-10-CM | POA: Diagnosis not present

## 2016-11-07 DIAGNOSIS — Z79899 Other long term (current) drug therapy: Secondary | ICD-10-CM | POA: Diagnosis not present

## 2016-11-07 DIAGNOSIS — E213 Hyperparathyroidism, unspecified: Secondary | ICD-10-CM | POA: Diagnosis not present

## 2016-11-07 DIAGNOSIS — D509 Iron deficiency anemia, unspecified: Secondary | ICD-10-CM | POA: Diagnosis not present

## 2016-11-07 DIAGNOSIS — N186 End stage renal disease: Secondary | ICD-10-CM | POA: Diagnosis not present

## 2016-11-08 DIAGNOSIS — E213 Hyperparathyroidism, unspecified: Secondary | ICD-10-CM | POA: Diagnosis not present

## 2016-11-08 DIAGNOSIS — E8779 Other fluid overload: Secondary | ICD-10-CM | POA: Diagnosis not present

## 2016-11-08 DIAGNOSIS — D631 Anemia in chronic kidney disease: Secondary | ICD-10-CM | POA: Diagnosis not present

## 2016-11-08 DIAGNOSIS — N186 End stage renal disease: Secondary | ICD-10-CM | POA: Diagnosis not present

## 2016-11-08 DIAGNOSIS — Z79899 Other long term (current) drug therapy: Secondary | ICD-10-CM | POA: Diagnosis not present

## 2016-11-08 DIAGNOSIS — D509 Iron deficiency anemia, unspecified: Secondary | ICD-10-CM | POA: Diagnosis not present

## 2016-11-09 ENCOUNTER — Encounter: Payer: Medicare Other | Admitting: Hematology

## 2016-11-10 DIAGNOSIS — D509 Iron deficiency anemia, unspecified: Secondary | ICD-10-CM | POA: Diagnosis not present

## 2016-11-10 DIAGNOSIS — Z79899 Other long term (current) drug therapy: Secondary | ICD-10-CM | POA: Diagnosis not present

## 2016-11-10 DIAGNOSIS — E8779 Other fluid overload: Secondary | ICD-10-CM | POA: Diagnosis not present

## 2016-11-10 DIAGNOSIS — E213 Hyperparathyroidism, unspecified: Secondary | ICD-10-CM | POA: Diagnosis not present

## 2016-11-10 DIAGNOSIS — N186 End stage renal disease: Secondary | ICD-10-CM | POA: Diagnosis not present

## 2016-11-10 DIAGNOSIS — D631 Anemia in chronic kidney disease: Secondary | ICD-10-CM | POA: Diagnosis not present

## 2016-11-11 ENCOUNTER — Telehealth: Payer: Self-pay | Admitting: Hematology

## 2016-11-11 DIAGNOSIS — Z79899 Other long term (current) drug therapy: Secondary | ICD-10-CM | POA: Diagnosis not present

## 2016-11-11 DIAGNOSIS — D631 Anemia in chronic kidney disease: Secondary | ICD-10-CM | POA: Diagnosis not present

## 2016-11-11 DIAGNOSIS — D509 Iron deficiency anemia, unspecified: Secondary | ICD-10-CM | POA: Diagnosis not present

## 2016-11-11 DIAGNOSIS — E213 Hyperparathyroidism, unspecified: Secondary | ICD-10-CM | POA: Diagnosis not present

## 2016-11-11 DIAGNOSIS — N186 End stage renal disease: Secondary | ICD-10-CM | POA: Diagnosis not present

## 2016-11-11 DIAGNOSIS — E8779 Other fluid overload: Secondary | ICD-10-CM | POA: Diagnosis not present

## 2016-11-11 NOTE — Telephone Encounter (Signed)
Pt's brother cld to reschedule appt from 2/22 to 2/28 at 22am. Aware to have brother at the [place] at [place] understanding.

## 2016-11-11 NOTE — Telephone Encounter (Signed)
Patient's brother stopped by today to r/s 2/19 new patient appointment. Gave brother new appointment for tomorrow 2/22 @ 12 pm to arrive 11:30 am. Date/time cleared with Dr. Irene Limbo.

## 2016-11-12 ENCOUNTER — Encounter: Payer: Medicare Other | Admitting: Hematology

## 2016-11-12 DIAGNOSIS — Z79899 Other long term (current) drug therapy: Secondary | ICD-10-CM | POA: Diagnosis not present

## 2016-11-12 DIAGNOSIS — E8779 Other fluid overload: Secondary | ICD-10-CM | POA: Diagnosis not present

## 2016-11-12 DIAGNOSIS — D631 Anemia in chronic kidney disease: Secondary | ICD-10-CM | POA: Diagnosis not present

## 2016-11-12 DIAGNOSIS — D509 Iron deficiency anemia, unspecified: Secondary | ICD-10-CM | POA: Diagnosis not present

## 2016-11-12 DIAGNOSIS — N186 End stage renal disease: Secondary | ICD-10-CM | POA: Diagnosis not present

## 2016-11-12 DIAGNOSIS — E213 Hyperparathyroidism, unspecified: Secondary | ICD-10-CM | POA: Diagnosis not present

## 2016-11-14 DIAGNOSIS — D509 Iron deficiency anemia, unspecified: Secondary | ICD-10-CM | POA: Diagnosis not present

## 2016-11-14 DIAGNOSIS — E8779 Other fluid overload: Secondary | ICD-10-CM | POA: Diagnosis not present

## 2016-11-14 DIAGNOSIS — E213 Hyperparathyroidism, unspecified: Secondary | ICD-10-CM | POA: Diagnosis not present

## 2016-11-14 DIAGNOSIS — Z79899 Other long term (current) drug therapy: Secondary | ICD-10-CM | POA: Diagnosis not present

## 2016-11-14 DIAGNOSIS — D631 Anemia in chronic kidney disease: Secondary | ICD-10-CM | POA: Diagnosis not present

## 2016-11-14 DIAGNOSIS — N186 End stage renal disease: Secondary | ICD-10-CM | POA: Diagnosis not present

## 2016-11-15 DIAGNOSIS — D631 Anemia in chronic kidney disease: Secondary | ICD-10-CM | POA: Diagnosis not present

## 2016-11-15 DIAGNOSIS — E8779 Other fluid overload: Secondary | ICD-10-CM | POA: Diagnosis not present

## 2016-11-15 DIAGNOSIS — N186 End stage renal disease: Secondary | ICD-10-CM | POA: Diagnosis not present

## 2016-11-15 DIAGNOSIS — Z79899 Other long term (current) drug therapy: Secondary | ICD-10-CM | POA: Diagnosis not present

## 2016-11-15 DIAGNOSIS — D509 Iron deficiency anemia, unspecified: Secondary | ICD-10-CM | POA: Diagnosis not present

## 2016-11-15 DIAGNOSIS — E213 Hyperparathyroidism, unspecified: Secondary | ICD-10-CM | POA: Diagnosis not present

## 2016-11-17 DIAGNOSIS — D509 Iron deficiency anemia, unspecified: Secondary | ICD-10-CM | POA: Diagnosis not present

## 2016-11-17 DIAGNOSIS — E8779 Other fluid overload: Secondary | ICD-10-CM | POA: Diagnosis not present

## 2016-11-17 DIAGNOSIS — Z79899 Other long term (current) drug therapy: Secondary | ICD-10-CM | POA: Diagnosis not present

## 2016-11-17 DIAGNOSIS — E213 Hyperparathyroidism, unspecified: Secondary | ICD-10-CM | POA: Diagnosis not present

## 2016-11-17 DIAGNOSIS — N186 End stage renal disease: Secondary | ICD-10-CM | POA: Diagnosis not present

## 2016-11-17 DIAGNOSIS — D631 Anemia in chronic kidney disease: Secondary | ICD-10-CM | POA: Diagnosis not present

## 2016-11-18 ENCOUNTER — Encounter: Payer: Self-pay | Admitting: Hematology

## 2016-11-18 ENCOUNTER — Telehealth: Payer: Self-pay | Admitting: Hematology

## 2016-11-18 ENCOUNTER — Ambulatory Visit (HOSPITAL_BASED_OUTPATIENT_CLINIC_OR_DEPARTMENT_OTHER): Payer: Medicare Other

## 2016-11-18 ENCOUNTER — Ambulatory Visit (HOSPITAL_BASED_OUTPATIENT_CLINIC_OR_DEPARTMENT_OTHER): Payer: Medicare Other | Admitting: Hematology

## 2016-11-18 VITALS — BP 180/89 | HR 74 | Temp 98.7°F | Resp 20 | Wt 177.0 lb

## 2016-11-18 DIAGNOSIS — I10 Essential (primary) hypertension: Secondary | ICD-10-CM | POA: Diagnosis not present

## 2016-11-18 DIAGNOSIS — B20 Human immunodeficiency virus [HIV] disease: Secondary | ICD-10-CM | POA: Diagnosis not present

## 2016-11-18 DIAGNOSIS — D72819 Decreased white blood cell count, unspecified: Secondary | ICD-10-CM | POA: Diagnosis not present

## 2016-11-18 DIAGNOSIS — Z992 Dependence on renal dialysis: Secondary | ICD-10-CM

## 2016-11-18 DIAGNOSIS — D61818 Other pancytopenia: Secondary | ICD-10-CM

## 2016-11-18 DIAGNOSIS — N186 End stage renal disease: Secondary | ICD-10-CM | POA: Diagnosis not present

## 2016-11-18 DIAGNOSIS — I120 Hypertensive chronic kidney disease with stage 5 chronic kidney disease or end stage renal disease: Secondary | ICD-10-CM | POA: Diagnosis not present

## 2016-11-18 LAB — COMPREHENSIVE METABOLIC PANEL
ALT: <6 U/L (ref 0–55)
AST: 15 U/L (ref 5–34)
Albumin: 3.9 g/dL (ref 3.5–5.0)
Alkaline Phosphatase: 68 U/L (ref 40–150)
Anion Gap: 10 mEq/L (ref 3–11)
BUN: 25.7 mg/dL (ref 7.0–26.0)
CALCIUM: 9.5 mg/dL (ref 8.4–10.4)
CO2: 29 mEq/L (ref 22–29)
Chloride: 97 mEq/L — ABNORMAL LOW (ref 98–109)
Creatinine: 6.6 mg/dL (ref 0.7–1.3)
EGFR: 10 mL/min/{1.73_m2} — AB (ref >90)
GLUCOSE: 95 mg/dL (ref 70–140)
POTASSIUM: 3.3 meq/L — AB (ref 3.5–5.1)
Sodium: 136 mEq/L (ref 136–145)
Total Bilirubin: 0.69 mg/dL (ref 0.20–1.20)
Total Protein: 7.9 g/dL (ref 6.4–8.3)

## 2016-11-18 LAB — CBC & DIFF AND RETIC
BASO%: 1.1 % (ref 0.0–2.0)
Basophils Absolute: 0.1 10*3/uL (ref 0.0–0.1)
EOS%: 5.5 % (ref 0.0–7.0)
Eosinophils Absolute: 0.3 10*3/uL (ref 0.0–0.5)
HCT: 29.9 % — ABNORMAL LOW (ref 38.4–49.9)
HGB: 10 g/dL — ABNORMAL LOW (ref 13.0–17.1)
Immature Retic Fract: 26 % — ABNORMAL HIGH (ref 3.00–10.60)
LYMPH%: 22 % (ref 14.0–49.0)
MCH: 32 pg (ref 27.2–33.4)
MCHC: 33.4 g/dL (ref 32.0–36.0)
MCV: 95.6 fL (ref 79.3–98.0)
MONO#: 0.6 10*3/uL (ref 0.1–0.9)
MONO%: 10.7 % (ref 0.0–14.0)
NEUT%: 60.7 % (ref 39.0–75.0)
NEUTROS ABS: 3.2 10*3/uL (ref 1.5–6.5)
Platelets: 166 10*3/uL (ref 140–400)
RBC: 3.12 10*6/uL — ABNORMAL LOW (ref 4.20–5.82)
RDW: 18.1 % — AB (ref 11.0–14.6)
RETIC %: 8.07 % — AB (ref 0.80–1.80)
Retic Ct Abs: 251.78 10*3/uL — ABNORMAL HIGH (ref 34.80–93.90)
WBC: 5.2 10*3/uL (ref 4.0–10.3)
lymph#: 1.2 10*3/uL (ref 0.9–3.3)

## 2016-11-18 LAB — TECHNOLOGIST REVIEW

## 2016-11-18 LAB — CHCC SMEAR

## 2016-11-18 LAB — LACTATE DEHYDROGENASE: LDH: 193 U/L (ref 125–245)

## 2016-11-18 NOTE — Patient Instructions (Signed)
-   would recommend empirically taking B complex 1 capsule po daily (Over the counter).

## 2016-11-18 NOTE — Progress Notes (Signed)
Jeffrey [name] Kitchen    HEMATOLOGY/ONCOLOGY CONSULTATION NOTE  Date of Service: 11/18/2016  Patient Care Team: Jeffrey Ochs, MD as PCP - General (Internal Medicine) Jeffrey Riches, MD as PCP - Infectious Diseases (Infectious Diseases) Nephrologist: Dr Jeffrey Grippe MD   CHIEF COMPLAINTS/PURPOSE OF CONSULTATION:  Leucopenia/Pancytopenia  HISTORY OF PRESENTING ILLNESS:   Jeffrey [name] is a wonderful 61 y.o. male who has been referred to [name] by Dr Jeffrey Grippe MD for evaluation and management of leucopenia.  Patient has multiple medical comorbidities including HIV, hepatitis B carrier, hypertension, dyslipidemia, thrombocytopenia and anemia, seizure disorder, end-stage renal disease on home dialysis, asthma.  Patient had recent labs with his Dr. Marylou [name] on 10/27/2016 that showed leukopenia with a WBC count of 2000 and neutrophils of 42%, hemoglobin of 8.3 with an MCV of 101 and RDW of 17.8 and platelet counts of 126k. He also previously has had WBC counts of 2.3k on 09/22/2016 and 3k on 09/02/2017 with radiating degrees of anemia ranging from 6.9-8.8. And platelet counts of 126-225k.  Patient notes minimal acute fevers or chills at this time. It goes that he has been on HIV medications for 4 years with no recent new medications. He follows with Dr. Johnnye [name] for HIV treatment.  He did have a severe bronchial infection in November 2017 and was admitted to the hospital for acute shortness of breath on 09/09/2016. Has history of severe aortic insufficiency which could also be an element involvement.  Patient notes that he has been following up with nephrology for iron replacement with Venofer once weekly and ESA's every 2 weeks  Notes no abdominal pain. Getting home dialysis regularly.  No other focal symptoms suggestive of infection at this time.  MEDICAL HISTORY:  Past Medical History:  Diagnosis Date  . Anemia   . Anxiety   . Arthritis   . Asthma    per pt hx  . End stage renal disease on home  HD 07/10/2011   Started HD in September 2012 at [facility] with a tunneled HD catheter, now on home HD with NxtStage. Dialyzing through AVF L lower arm with buttonhole technique as of mid 2014. His brother does the HD treatments at home.  They are roommates for 23 years.  The brother works 3rd shift and gets off about 8am and then puts Jeffrey [name] on HD in the morning after getting home. Most of the time he does HD about 4 times a week, for about 4 hours per treatment. Cause of ESRD was HTN according to patient. He says he let his health go and ending up with complications, and that he didn't like seeing doctors in those days.  He says he was diagnosed with severe HTN when he lived in [place] in his 74's.   . Gout   . Hepatitis B carrier (Jeffrey [name])   . HIV infection ([place])   . Hypertension   . Hyperthyroidism   . Pneumonia   . Seizure (Jeffrey [name])   . Seizures (Jeffrey [name]) 06/02/2011   pt denies this  . Thrombocytopenia (Jeffrey [name])     SURGICAL HISTORY: Past Surgical History:  Procedure Laterality Date  . AV FISTULA PLACEMENT  06/02/11   Left radiocephalic AVF  . COLONOSCOPY    . OTHER SURGICAL HISTORY     removal temporary HD catheter   . REVISON OF ARTERIOVENOUS FISTULA Left 10/10/2015   Procedure: REVISON OF LEFT RADIOCEPHALIC ARTERIOVENOUS FISTULA;  Surgeon: Angelia Mould, MD;  Location: [place];  Service: Vascular;  Laterality: Left;  .  REVISON OF ARTERIOVENOUS FISTULA Left 02/07/2016   Procedure: REPAIR OF PSEUDO-ANEUREYSM OF LEFT ARM  ARTERIOVENOUS FISTULA;  Surgeon: Angelia Mould, MD;  Location: [place];  Service: Vascular;  Laterality: Left;  . REVISON OF ARTERIOVENOUS FISTULA Left 7/34/2876   Procedure: PLICATION OF LEFT ARM RADIOCEPHALIC ARTERIOVENOUS FISTULA PSEUDOANEURYSM;  Surgeon: Angelia Mould, MD;  Location: [place];  Service: Vascular;  Laterality: Left;  . TEE WITHOUT CARDIOVERSION N/A 09/11/2016   Procedure: TRANSESOPHAGEAL ECHOCARDIOGRAM (TEE);  Surgeon: Dorothy Spark, MD;   Location: [facility] ENDOSCOPY;  Service: Cardiovascular;  Laterality: N/A;    SOCIAL HISTORY: Social History   Social History  . Marital status: Single    Spouse name: N/A  . Number of children: N/A  . Years of education: N/A   Occupational History  . Not on file.   Social History Main Topics  . Smoking status: Former Smoker    Types: Cigarettes    Quit date: 08/08/2015  . Smokeless tobacco: Never Used  . Alcohol use No  . Drug use: No     Comment: uds (+) cocaine in 08/2011 but pt states was taking sudafed at the time  . Sexual activity: Not Currently    Partners: Male   Other Topics Concern  . 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 Hypertension Mother   . Diabetes Mother   . Cancer Father     ALLERGIES:  is allergic to lisinopril and penicillins.  MEDICATIONS:  Current Outpatient Prescriptions  Medication Sig Dispense Refill  . albuterol (PROVENTIL) (2.5 MG/3ML) 0.083% nebulizer solution Take 2.5 mg by nebulization every 6 (six) hours as needed for wheezing or shortness of breath.    Jeffrey [name] Kitchen amLODipine (NORVASC) 10 MG tablet Take 5 mg by mouth 2 (two) times daily.     . calcium acetate (PHOSLO) 667 MG capsule Take 667-2,001 mg by mouth See admin instructions. 1,334-2,001 mg three times a day with each meal and 667-1,334 mg with each snack    . carvedilol (COREG) 25 MG tablet Take 25 mg by mouth 2 (two) times daily with a meal.    . cloNIDine (CATAPRES) 0.1 MG tablet Take 0.1 mg by mouth as needed (as needed related to hypertension while on hemodialysis).   6  . COMBIVENT RESPIMAT 20-100 MCG/ACT AERS respimat INHALE 1 PUFF BY MOUTH EVERY 4 HOURS AS NEEDED FOR WHEEZING OR SHORTNESS OF BREATH 4 g 0  . doxazosin (CARDURA) 2 MG tablet Take 2 mg  by mouth daily.  11  . EPIVIR HBV 5 MG/ML solution TAKE 5 ML BY MOUTH ONCE DAILY 240 mL 3  . ethyl chloride spray Apply 1 application topically daily as needed. At HD  12  . hydrALAZINE (APRESOLINE) 100 MG tablet Take 100 mg by mouth 3 (three) times daily.     . ISENTRESS 400 MG tablet TAKE 1 TABLET(400 MG) BY MOUTH TWICE DAILY 60 tablet 6  . multivitamin (RENA-VIT) TABS tablet Take 1 tablet by mouth daily.      Jeffrey [name] Kitchen oxyCODONE-acetaminophen (PERCOCET/ROXICET) 5-325 MG tablet Take 1 tablet by mouth every 8 (eight) hours as needed for moderate pain or severe pain. 10 tablet 0  . tenofovir (VIREAD) 300  MG tablet TAKE 1 TABLET BY MOUTH EVERY SATURDAY AFTER DIALYSIS 5 tablet 5  . traZODone (DESYREL) 50 MG tablet Take 50 mg by mouth at bedtime as needed for sleep.     No current facility-administered medications for this visit.     REVIEW OF SYSTEMS:    10 Point review of Systems was done is negative except as noted above.  PHYSICAL EXAMINATION: ECOG PERFORMANCE STATUS: 1 - Symptomatic but completely ambulatory  . Vitals:   11/18/16 0807  BP: (!) 180/89  Pulse: 74  Resp: 20  Temp: 98.7 F (37.1 C)   Filed Weights   11/18/16 0807  Weight: 177 lb (80.3 kg)   .Body mass index is 24.01 kg/m.  GENERAL:alert, in no acute distress and comfortable SKIN: No acute rashes  EYES: normal, conjunctiva are pink and non-injected, sclera clear OROPHARYNX:no exudate, no erythema and lips, buccal mucosa, and tongue normal  NECK: supple, no JVD, thyroid normal size, non-tender, without nodularity LYMPH:  no palpable lymphadenopathy in the cervical, axillary or inguinal LUNGS: clear to auscultation with normal respiratory effort HEART: regular rate & rhythm,  2/6 diastolic murmur over aortic area and bilateral trace pedal edema ABDOMEN: abdomen soft, non-tender, normoactive bowel sounds  Musculoskeletal: no cyanosis of digits and no clubbing  PSYCH: alert & oriented x 3 with fluent speech NEURO: no  focal motor/sensory deficits  LABORATORY DATA:  I have reviewed the data as listed  . CBC Latest Ref Rng & Units 11/18/2016 11/18/2016 09/12/2016  WBC 4.0 - 10.3 10e3/uL 5.2 - 8.1  Hemoglobin 13.0 - 17.1 g/dL 10.0(L) - 8.0(L)  Hematocrit 37.5 - 51.0 % 29.9(L) 29.9(L) 24.8(L)  Platelets 140 - 400 10e3/uL 166 - 166   . CBC    Component Value Date/Time   WBC 5.2 11/18/2016 1036   WBC 8.1 09/12/2016 0614   RBC 3.12 (L) 11/18/2016 1036   RBC 2.72 (L) 09/12/2016 0614   HGB 10.0 (L) 11/18/2016 1036   HCT 29.9 (L) 11/18/2016 1036   HCT 29.9 (L) 11/18/2016 1036   PLT 166 11/18/2016 1036   MCV 95.6 11/18/2016 1036   MCH 32.0 11/18/2016 1036   MCH 29.4 09/12/2016 0614   MCHC 33.4 11/18/2016 1036   MCHC 32.3 09/12/2016 0614   RDW 18.1 (H) 11/18/2016 1036   LYMPHSABS 1.2 11/18/2016 1036   MONOABS 0.6 11/18/2016 1036   EOSABS 0.3 11/18/2016 1036   BASOSABS 0.1 11/18/2016 1036   ANC 3.2k   CMP Latest Ref Rng & Units 11/18/2016 11/18/2016 09/12/2016  Glucose 70 - 140 mg/dl 95 - 95  BUN 7.0 - 26.0 mg/dL 25.7 - 22(H)  Creatinine 0.7 - 1.3 mg/dL 6.6(HH) - 4.27(H)  Sodium 136 - 145 mEq/L 136 - 135  Potassium 3.5 - 5.1 mEq/L 3.3(L) - 3.5  Chloride 101 - 111 mmol/L - - 96(L)  CO2 22 - 29 mEq/L 29 - 31  Calcium 8.4 - 10.4 mg/dL 9.5 - 9.1  Total Protein 6.0 - 8.5 g/dL 7.9 7.4 -  Total Bilirubin 0.20 - 1.20 mg/dL 0.69 - -  Alkaline Phos 40 - 150 U/L 68 - -  AST 5 - 34 U/L 15 - -  ALT 0-55 U/L U/L <6 - -     RADIOGRAPHIC STUDIES: I have personally reviewed the radiological images as listed and agreed with the findings in the report. No results found.  ASSESSMENT & PLAN:   61 year old African-American male with history of HIV on HAART therapy with  1) New leukopenia over the  last couple of months. Now resolved on labs today with a WBC count of 5.2k with an ANC of 3.2k. Could have been from his back bronchial infection a few months ago. Has multiple etiologies that could cause this  including HIV, changes with hemodialysis, medications.  Currently no overt symptoms suggestive of lymphoma or active active infections.  2) Anemia - macrocytic. This is partly due to the HIV medications but anemia is predominantly driven by his chronic kidney disease and iron deficiency anemia related to dialysis. He is currently on IV Iron as needed and ESA's per nephrology  3) Thrombocytopenia - intermittent now resolved.  Patient had recent labs with his Dr. Marylou [name] on 10/27/2016 that showed leukopenia with a WBC count of 2000 and neutrophils of 42%, hemoglobin of 8.3 with an MCV of 101 and RDW of 17.8 and platelet counts of 126k. He also previously has had WBC counts of 2.3k on 09/22/2016 and 3k on 09/02/2017 with radiating degrees of anemia ranging from 6.9-8.8. And platelet counts of 126-225k.  PLAN  -Patient's lab today shows improving anemia resolved thrombocytopenia and leukopenia /neutropenia . -In working up his leukopenia/neutropenia noted on his nephrology labs a workup was sent out to rule out viral infections and nutritional deficiencies and paraproteinemia as noted below .  Jeffrey [name] Kitchen Orders Placed This Encounter  Procedures  . CBC & Diff and Retic    Standing Status:   Future    Number of Occurrences:   1    Standing Expiration Date:   11/18/2017  . Comprehensive metabolic panel    Standing Status:   Future    Number of Occurrences:   1    Standing Expiration Date:   11/18/2017  . Smear    Standing Status:   Future    Number of Occurrences:   1    Standing Expiration Date:   11/18/2017  . Lactate dehydrogenase    Standing Status:   Future    Number of Occurrences:   1    Standing Expiration Date:   11/18/2017  . Sedimentation rate    Standing Status:   Future    Number of Occurrences:   1    Standing Expiration Date:   11/18/2017  . Haptoglobin    Standing Status:   Future    Number of Occurrences:   1    Standing Expiration Date:   11/18/2017  . Epstein-Barr virus VCA, IgM     Standing Status:   Future    Number of Occurrences:   1    Standing Expiration Date:   11/18/2017  . Epstein-Barr virus VCA, IgG    Standing Status:   Future    Number of Occurrences:   1    Standing Expiration Date:   11/18/2017  . CMV antibody, IgG (EIA)    Standing Status:   Future    Number of Occurrences:   1    Standing Expiration Date:   11/18/2017  . CMV IgM    Standing Status:   Future    Number of Occurrences:   1    Standing Expiration Date:   11/18/2017  . Parvovirus B19 IgM    Standing Status:   Future    Number of Occurrences:   1    Standing Expiration Date:   11/18/2017  . Parvovirus B19 IgG    Standing Status:   Future    Number of Occurrences:   1    Standing Expiration Date:   11/18/2017  .  Vitamin B12    Standing Status:   Future    Number of Occurrences:   1    Standing Expiration Date:   11/18/2017  . Copper, serum    Standing Status:   Future    Number of Occurrences:   1    Standing Expiration Date:   11/18/2017  . Folate RBC    Standing Status:   Future    Number of Occurrences:   1    Standing Expiration Date:   11/18/2017  . Multiple Myeloma Panel (SPEP&IFE w/QIG)    Standing Status:   Future    Number of Occurrences:   1    Standing Expiration Date:   11/18/2017  . Kappa/lambda light chains    Standing Status:   Future    Number of Occurrences:   1    Standing Expiration Date:   12/23/2017   -Continue IV iron and ESA's with nephrology to manage anemia. -No indication for changing any of his current important medications.   Labs today RTC in 2 weeks with Dr Irene Limbo     All of the patients questions were answered with apparent satisfaction. The patient knows to call the clinic with any problems, questions or concerns.  I spent 46 minutes counseling the patient face to face. The total time spent in the appointment was 62 minutes and more than 50% was on counseling and direct patient cares.    Sullivan Lone MD [place] A[facility]IV[facility]  [facility] Hematology/Oncology Physician [facility]  (Office):       [phone] (Work cell):  [phone] (Fax):           [phone]  11/18/2016 8:53 AM

## 2016-11-18 NOTE — Telephone Encounter (Signed)
Lab appointment was added today,per 11/18/16 los. Follow up appointment with Dr Irene Limbo was scheduled in 2 weeks, per 11/18/16 los. Patient was given a copy of the AVS report and appointment schedule per 11/18/16 los.

## 2016-11-19 DIAGNOSIS — N186 End stage renal disease: Secondary | ICD-10-CM | POA: Diagnosis not present

## 2016-11-19 DIAGNOSIS — N2581 Secondary hyperparathyroidism of renal origin: Secondary | ICD-10-CM | POA: Diagnosis not present

## 2016-11-19 DIAGNOSIS — E213 Hyperparathyroidism, unspecified: Secondary | ICD-10-CM | POA: Diagnosis not present

## 2016-11-19 DIAGNOSIS — D509 Iron deficiency anemia, unspecified: Secondary | ICD-10-CM | POA: Diagnosis not present

## 2016-11-19 DIAGNOSIS — D631 Anemia in chronic kidney disease: Secondary | ICD-10-CM | POA: Diagnosis not present

## 2016-11-19 DIAGNOSIS — Z79899 Other long term (current) drug therapy: Secondary | ICD-10-CM | POA: Diagnosis not present

## 2016-11-19 DIAGNOSIS — E8779 Other fluid overload: Secondary | ICD-10-CM | POA: Diagnosis not present

## 2016-11-19 LAB — FOLATE RBC
Folate, Hemolysate: >620 ng/mL
Hematocrit: 29.9 % — ABNORMAL LOW (ref 37.5–51.0)

## 2016-11-19 LAB — CMV IGM

## 2016-11-19 LAB — KAPPA/LAMBDA LIGHT CHAINS
Ig Kappa Free Light Chain: 277.6 mg/L — ABNORMAL HIGH (ref 3.3–19.4)
Ig Lambda Free Light Chain: 115.3 mg/L — ABNORMAL HIGH (ref 5.7–26.3)
KAPPA/LAMBDA FLC RATIO: 2.41 — AB (ref 0.26–1.65)

## 2016-11-19 LAB — VITAMIN B12: Vitamin B12: 499 pg/mL (ref 232–1245)

## 2016-11-19 LAB — PARVOVIRUS B19 IGM: PARVOVIRUS B19 IGM: 0.2 {index} (ref 0.0–0.8)

## 2016-11-19 LAB — HAPTOGLOBIN: Haptoglobin: 93 mg/dL (ref 34–200)

## 2016-11-19 LAB — CMV ANTIBODY, IGG (EIA)

## 2016-11-19 LAB — EPSTEIN-BARR VIRUS VCA, IGM: EBV VCA IgM: <36 U/mL (ref 0.0–35.9)

## 2016-11-19 LAB — EPSTEIN-BARR VIRUS VCA, IGG: EBV VCA IgG: >600 U/mL — ABNORMAL HIGH (ref 0.0–17.9)

## 2016-11-19 LAB — SEDIMENTATION RATE: SED RATE: 13 mm/h (ref 0–30)

## 2016-11-19 LAB — PARVOVIRUS B19 IGG: PARVOVIRUS B19 IGG: 0.4 {index} (ref 0.0–0.8)

## 2016-11-20 ENCOUNTER — Other Ambulatory Visit: Payer: Self-pay | Admitting: Internal Medicine

## 2016-11-20 DIAGNOSIS — B20 Human immunodeficiency virus [HIV] disease: Secondary | ICD-10-CM

## 2016-11-21 DIAGNOSIS — Z79899 Other long term (current) drug therapy: Secondary | ICD-10-CM | POA: Diagnosis not present

## 2016-11-21 DIAGNOSIS — N186 End stage renal disease: Secondary | ICD-10-CM | POA: Diagnosis not present

## 2016-11-21 DIAGNOSIS — D509 Iron deficiency anemia, unspecified: Secondary | ICD-10-CM | POA: Diagnosis not present

## 2016-11-21 DIAGNOSIS — E213 Hyperparathyroidism, unspecified: Secondary | ICD-10-CM | POA: Diagnosis not present

## 2016-11-21 DIAGNOSIS — E8779 Other fluid overload: Secondary | ICD-10-CM | POA: Diagnosis not present

## 2016-11-21 DIAGNOSIS — D631 Anemia in chronic kidney disease: Secondary | ICD-10-CM | POA: Diagnosis not present

## 2016-11-21 LAB — COPPER, SERUM: Copper: 98 ug/dL (ref 72–166)

## 2016-11-22 DIAGNOSIS — N186 End stage renal disease: Secondary | ICD-10-CM | POA: Diagnosis not present

## 2016-11-22 DIAGNOSIS — E213 Hyperparathyroidism, unspecified: Secondary | ICD-10-CM | POA: Diagnosis not present

## 2016-11-22 DIAGNOSIS — E8779 Other fluid overload: Secondary | ICD-10-CM | POA: Diagnosis not present

## 2016-11-22 DIAGNOSIS — D631 Anemia in chronic kidney disease: Secondary | ICD-10-CM | POA: Diagnosis not present

## 2016-11-22 DIAGNOSIS — Z79899 Other long term (current) drug therapy: Secondary | ICD-10-CM | POA: Diagnosis not present

## 2016-11-22 DIAGNOSIS — D509 Iron deficiency anemia, unspecified: Secondary | ICD-10-CM | POA: Diagnosis not present

## 2016-11-23 LAB — MULTIPLE MYELOMA PANEL, SERUM
ALBUMIN/GLOB SERPL: 1.3 (ref 0.7–1.7)
ALPHA2 GLOB SERPL ELPH-MCNC: 0.6 g/dL (ref 0.4–1.0)
Albumin SerPl Elph-Mcnc: 4.1 g/dL (ref 2.9–4.4)
Alpha 1: 0.3 g/dL (ref 0.0–0.4)
B-GLOBULIN SERPL ELPH-MCNC: 0.7 g/dL (ref 0.7–1.3)
GLOBULIN, TOTAL: 3.3 g/dL (ref 2.2–3.9)
Gamma Glob SerPl Elph-Mcnc: 1.8 g/dL (ref 0.4–1.8)
IgA, Qn, Serum: 115 mg/dL (ref 61–437)
IgM, Qn, Serum: 40 mg/dL (ref 20–172)
Total Protein: 7.4 g/dL (ref 6.0–8.5)

## 2016-11-24 DIAGNOSIS — Z79899 Other long term (current) drug therapy: Secondary | ICD-10-CM | POA: Diagnosis not present

## 2016-11-24 DIAGNOSIS — D631 Anemia in chronic kidney disease: Secondary | ICD-10-CM | POA: Diagnosis not present

## 2016-11-24 DIAGNOSIS — N186 End stage renal disease: Secondary | ICD-10-CM | POA: Diagnosis not present

## 2016-11-24 DIAGNOSIS — E8779 Other fluid overload: Secondary | ICD-10-CM | POA: Diagnosis not present

## 2016-11-24 DIAGNOSIS — D509 Iron deficiency anemia, unspecified: Secondary | ICD-10-CM | POA: Diagnosis not present

## 2016-11-24 DIAGNOSIS — E213 Hyperparathyroidism, unspecified: Secondary | ICD-10-CM | POA: Diagnosis not present

## 2016-11-26 DIAGNOSIS — Z79899 Other long term (current) drug therapy: Secondary | ICD-10-CM | POA: Diagnosis not present

## 2016-11-26 DIAGNOSIS — N186 End stage renal disease: Secondary | ICD-10-CM | POA: Diagnosis not present

## 2016-11-26 DIAGNOSIS — E8779 Other fluid overload: Secondary | ICD-10-CM | POA: Diagnosis not present

## 2016-11-26 DIAGNOSIS — D631 Anemia in chronic kidney disease: Secondary | ICD-10-CM | POA: Diagnosis not present

## 2016-11-26 DIAGNOSIS — M109 Gout, unspecified: Secondary | ICD-10-CM | POA: Diagnosis not present

## 2016-11-26 DIAGNOSIS — D509 Iron deficiency anemia, unspecified: Secondary | ICD-10-CM | POA: Diagnosis not present

## 2016-11-26 DIAGNOSIS — E213 Hyperparathyroidism, unspecified: Secondary | ICD-10-CM | POA: Diagnosis not present

## 2016-11-27 DIAGNOSIS — Z79899 Other long term (current) drug therapy: Secondary | ICD-10-CM | POA: Diagnosis not present

## 2016-11-27 DIAGNOSIS — D509 Iron deficiency anemia, unspecified: Secondary | ICD-10-CM | POA: Diagnosis not present

## 2016-11-27 DIAGNOSIS — E8779 Other fluid overload: Secondary | ICD-10-CM | POA: Diagnosis not present

## 2016-11-27 DIAGNOSIS — D631 Anemia in chronic kidney disease: Secondary | ICD-10-CM | POA: Diagnosis not present

## 2016-11-27 DIAGNOSIS — E213 Hyperparathyroidism, unspecified: Secondary | ICD-10-CM | POA: Diagnosis not present

## 2016-11-27 DIAGNOSIS — N186 End stage renal disease: Secondary | ICD-10-CM | POA: Diagnosis not present

## 2016-11-28 DIAGNOSIS — Z79899 Other long term (current) drug therapy: Secondary | ICD-10-CM | POA: Diagnosis not present

## 2016-11-28 DIAGNOSIS — D509 Iron deficiency anemia, unspecified: Secondary | ICD-10-CM | POA: Diagnosis not present

## 2016-11-28 DIAGNOSIS — E8779 Other fluid overload: Secondary | ICD-10-CM | POA: Diagnosis not present

## 2016-11-28 DIAGNOSIS — D631 Anemia in chronic kidney disease: Secondary | ICD-10-CM | POA: Diagnosis not present

## 2016-11-28 DIAGNOSIS — E213 Hyperparathyroidism, unspecified: Secondary | ICD-10-CM | POA: Diagnosis not present

## 2016-11-28 DIAGNOSIS — N186 End stage renal disease: Secondary | ICD-10-CM | POA: Diagnosis not present

## 2016-11-29 DIAGNOSIS — Z79899 Other long term (current) drug therapy: Secondary | ICD-10-CM | POA: Diagnosis not present

## 2016-11-29 DIAGNOSIS — N186 End stage renal disease: Secondary | ICD-10-CM | POA: Diagnosis not present

## 2016-11-29 DIAGNOSIS — E8779 Other fluid overload: Secondary | ICD-10-CM | POA: Diagnosis not present

## 2016-11-29 DIAGNOSIS — D631 Anemia in chronic kidney disease: Secondary | ICD-10-CM | POA: Diagnosis not present

## 2016-11-29 DIAGNOSIS — E213 Hyperparathyroidism, unspecified: Secondary | ICD-10-CM | POA: Diagnosis not present

## 2016-11-29 DIAGNOSIS — D509 Iron deficiency anemia, unspecified: Secondary | ICD-10-CM | POA: Diagnosis not present

## 2016-11-30 ENCOUNTER — Telehealth: Payer: Self-pay | Admitting: Hematology

## 2016-11-30 NOTE — Telephone Encounter (Signed)
R/s Patient MD appt per patient Brother from 3/12 to 4/12. Patient has a funeral they have to attend and will not be able to make it to the appt.

## 2016-12-01 ENCOUNTER — Ambulatory Visit: Payer: Medicare Other | Admitting: Hematology

## 2016-12-01 DIAGNOSIS — D509 Iron deficiency anemia, unspecified: Secondary | ICD-10-CM | POA: Diagnosis not present

## 2016-12-01 DIAGNOSIS — E8779 Other fluid overload: Secondary | ICD-10-CM | POA: Diagnosis not present

## 2016-12-01 DIAGNOSIS — N186 End stage renal disease: Secondary | ICD-10-CM | POA: Diagnosis not present

## 2016-12-01 DIAGNOSIS — Z79899 Other long term (current) drug therapy: Secondary | ICD-10-CM | POA: Diagnosis not present

## 2016-12-01 DIAGNOSIS — E213 Hyperparathyroidism, unspecified: Secondary | ICD-10-CM | POA: Diagnosis not present

## 2016-12-01 DIAGNOSIS — D631 Anemia in chronic kidney disease: Secondary | ICD-10-CM | POA: Diagnosis not present

## 2016-12-03 DIAGNOSIS — Z79899 Other long term (current) drug therapy: Secondary | ICD-10-CM | POA: Diagnosis not present

## 2016-12-03 DIAGNOSIS — N186 End stage renal disease: Secondary | ICD-10-CM | POA: Diagnosis not present

## 2016-12-03 DIAGNOSIS — E8779 Other fluid overload: Secondary | ICD-10-CM | POA: Diagnosis not present

## 2016-12-03 DIAGNOSIS — D631 Anemia in chronic kidney disease: Secondary | ICD-10-CM | POA: Diagnosis not present

## 2016-12-03 DIAGNOSIS — D509 Iron deficiency anemia, unspecified: Secondary | ICD-10-CM | POA: Diagnosis not present

## 2016-12-03 DIAGNOSIS — E213 Hyperparathyroidism, unspecified: Secondary | ICD-10-CM | POA: Diagnosis not present

## 2016-12-05 DIAGNOSIS — N186 End stage renal disease: Secondary | ICD-10-CM | POA: Diagnosis not present

## 2016-12-05 DIAGNOSIS — D509 Iron deficiency anemia, unspecified: Secondary | ICD-10-CM | POA: Diagnosis not present

## 2016-12-05 DIAGNOSIS — D631 Anemia in chronic kidney disease: Secondary | ICD-10-CM | POA: Diagnosis not present

## 2016-12-05 DIAGNOSIS — E213 Hyperparathyroidism, unspecified: Secondary | ICD-10-CM | POA: Diagnosis not present

## 2016-12-05 DIAGNOSIS — E8779 Other fluid overload: Secondary | ICD-10-CM | POA: Diagnosis not present

## 2016-12-05 DIAGNOSIS — Z79899 Other long term (current) drug therapy: Secondary | ICD-10-CM | POA: Diagnosis not present

## 2016-12-06 DIAGNOSIS — D631 Anemia in chronic kidney disease: Secondary | ICD-10-CM | POA: Diagnosis not present

## 2016-12-06 DIAGNOSIS — Z79899 Other long term (current) drug therapy: Secondary | ICD-10-CM | POA: Diagnosis not present

## 2016-12-06 DIAGNOSIS — D509 Iron deficiency anemia, unspecified: Secondary | ICD-10-CM | POA: Diagnosis not present

## 2016-12-06 DIAGNOSIS — N186 End stage renal disease: Secondary | ICD-10-CM | POA: Diagnosis not present

## 2016-12-06 DIAGNOSIS — E8779 Other fluid overload: Secondary | ICD-10-CM | POA: Diagnosis not present

## 2016-12-06 DIAGNOSIS — E213 Hyperparathyroidism, unspecified: Secondary | ICD-10-CM | POA: Diagnosis not present

## 2016-12-08 DIAGNOSIS — E8779 Other fluid overload: Secondary | ICD-10-CM | POA: Diagnosis not present

## 2016-12-08 DIAGNOSIS — D631 Anemia in chronic kidney disease: Secondary | ICD-10-CM | POA: Diagnosis not present

## 2016-12-08 DIAGNOSIS — E213 Hyperparathyroidism, unspecified: Secondary | ICD-10-CM | POA: Diagnosis not present

## 2016-12-08 DIAGNOSIS — D509 Iron deficiency anemia, unspecified: Secondary | ICD-10-CM | POA: Diagnosis not present

## 2016-12-08 DIAGNOSIS — Z79899 Other long term (current) drug therapy: Secondary | ICD-10-CM | POA: Diagnosis not present

## 2016-12-08 DIAGNOSIS — N186 End stage renal disease: Secondary | ICD-10-CM | POA: Diagnosis not present

## 2016-12-10 ENCOUNTER — Ambulatory Visit (INDEPENDENT_AMBULATORY_CARE_PROVIDER_SITE_OTHER): Payer: Medicare Other | Admitting: Internal Medicine

## 2016-12-10 ENCOUNTER — Encounter: Payer: Self-pay | Admitting: Internal Medicine

## 2016-12-10 VITALS — BP 179/85 | HR 74 | Temp 98.2°F | Ht 72.0 in | Wt 178.0 lb

## 2016-12-10 DIAGNOSIS — R234 Changes in skin texture: Secondary | ICD-10-CM | POA: Diagnosis not present

## 2016-12-10 DIAGNOSIS — Z992 Dependence on renal dialysis: Secondary | ICD-10-CM | POA: Diagnosis not present

## 2016-12-10 DIAGNOSIS — Z87891 Personal history of nicotine dependence: Secondary | ICD-10-CM | POA: Diagnosis not present

## 2016-12-10 DIAGNOSIS — N186 End stage renal disease: Secondary | ICD-10-CM

## 2016-12-10 DIAGNOSIS — I351 Nonrheumatic aortic (valve) insufficiency: Secondary | ICD-10-CM

## 2016-12-10 DIAGNOSIS — Z8619 Personal history of other infectious and parasitic diseases: Secondary | ICD-10-CM | POA: Diagnosis not present

## 2016-12-10 DIAGNOSIS — D509 Iron deficiency anemia, unspecified: Secondary | ICD-10-CM | POA: Diagnosis not present

## 2016-12-10 DIAGNOSIS — D631 Anemia in chronic kidney disease: Secondary | ICD-10-CM | POA: Diagnosis not present

## 2016-12-10 DIAGNOSIS — I120 Hypertensive chronic kidney disease with stage 5 chronic kidney disease or end stage renal disease: Secondary | ICD-10-CM | POA: Diagnosis present

## 2016-12-10 DIAGNOSIS — E8779 Other fluid overload: Secondary | ICD-10-CM | POA: Diagnosis not present

## 2016-12-10 DIAGNOSIS — E213 Hyperparathyroidism, unspecified: Secondary | ICD-10-CM | POA: Diagnosis not present

## 2016-12-10 DIAGNOSIS — L299 Pruritus, unspecified: Secondary | ICD-10-CM

## 2016-12-10 DIAGNOSIS — I10 Essential (primary) hypertension: Secondary | ICD-10-CM

## 2016-12-10 DIAGNOSIS — Z79899 Other long term (current) drug therapy: Secondary | ICD-10-CM | POA: Diagnosis not present

## 2016-12-10 DIAGNOSIS — L309 Dermatitis, unspecified: Secondary | ICD-10-CM | POA: Insufficient documentation

## 2016-12-10 MED ORDER — CLONIDINE HCL 0.1 MG/24HR TD PTWK: 0.1000 mg | MEDICATED_PATCH | TRANSDERMAL | 2 refills | Status: DC

## 2016-12-10 NOTE — Patient Instructions (Signed)
Jeffrey [name],  It was a pleasure seeing you today. For your blood pressure, I have changed your clonidine to a weekly patch. I have sent a new prescription to your pharmacy. Please stop taking the clonidine pills.  I would like for you to follow up with me next week in clinic to have your blood pressure rechecked. For your eczema, please use hypoallergenic moisturizing creams like cetaphil or eucerin. When you get out of the shower, pat the area dry and apply the cream daily. If you have any questions or concerns, call our clinic at [phone] or after hours call [phone] and ask for the internal medicine resident on call. Thank you!  - Dr. Philipp Ovens     Eczema Eczema, also called atopic dermatitis, is a skin disorder that causes inflammation of the skin. It causes a red rash and dry, scaly skin. The skin becomes very itchy. Eczema is generally worse during the cooler winter months and often improves with the warmth of summer.  What are the causes? The exact cause of eczema is not known, but it appears to run in families. People with eczema often have a family history of eczema, allergies, asthma, or hay fever. Eczema is not contagious. Flare-ups of the condition may be caused by:  Contact with something you are sensitive or allergic to.  Stress. What are the signs or symptoms?  Dry, scaly skin.  Red, itchy rash.  Itchiness. This may occur before the skin rash and may be very intense. How is this diagnosed? The diagnosis of eczema is usually made based on symptoms and medical history. How is this treated? Eczema cannot be cured, but symptoms usually can be controlled with treatment and other strategies. A treatment plan might include:  Controlling the itching and scratching.  Use over-the-counter antihistamines as directed for itching. This is especially useful at night when the itching tends to be worse.  Use over-the-counter steroid creams as directed for itching.  Avoid  scratching. Scratching makes the rash and itching worse. It may also result in a skin infection (impetigo) due to a break in the skin caused by scratching.  Keeping the skin well moisturized with creams every day. This will seal in moisture and help prevent dryness. Lotions that contain alcohol and water should be avoided because they can dry the skin.  Limiting exposure to things that you are sensitive or allergic to (allergens).  Recognizing situations that cause stress.  Developing a plan to manage stress. Follow these instructions at home:  Only take over-the-counter or prescription medicines as directed by your health care provider.  Do not use anything on the skin without checking with your health care provider.  Keep baths or showers short (5 minutes) in warm (not hot) water. Use mild cleansers for bathing. These should be unscented. You may add nonperfumed bath oil to the bath water. It is best to avoid soap and bubble bath.  Immediately after a bath or shower, when the skin is still damp, apply a moisturizing ointment to the entire body. This ointment should be a petroleum ointment. This will seal in moisture and help prevent dryness. The thicker the ointment, the better. These should be unscented.  Keep fingernails cut short. Children with eczema may need to wear soft gloves or mittens at night after applying an ointment.  Dress in clothes made of cotton or cotton blends. Dress lightly, because heat increases itching.  A child with eczema should stay away from anyone with fever blisters or cold sores.  The virus that causes fever blisters (herpes simplex) can cause a serious skin infection in children with eczema. Contact a health care provider if:  Your itching interferes with sleep.  Your rash gets worse or is not better within 1 week after starting treatment.  You see pus or soft yellow scabs in the rash area.  You have a fever.  You have a rash flare-up after contact  with someone who has fever blisters. This information is not intended to replace advice given to you by your health care provider. Make sure you discuss any questions you have with your health care provider. Document Released: 09/04/2000 Document Revised: 02/13/2016 Document Reviewed: 04/10/2013 Elsevier Interactive Patient Education  2017 [employer].

## 2016-12-10 NOTE — Assessment & Plan Note (Signed)
Patient is complaining of itching in his left groin area. Exam is notable for an area of dry and flaky skin. No erythema or obvious rash. Likely atopic dermatitis. Instructed patient to dry the area well after showering and apply hypoallergenic moisturizing creams such as Eucerin or Cetaphil.  -Follow-up as needed

## 2016-12-10 NOTE — Assessment & Plan Note (Addendum)
Jeffrey [name] was hospitalized in November with severe low back pain and was found to have a MSSA bacteremia. He was treated with Ancef intravenously for 2 weeks. He was readmitted to [facility] on 09/09/2016 with shortness of breath and worsening anemia. Work up included a TEE which showed severe aortic insufficiency, but no definite vegetation. He was evaluated by CT surgery on 10-28-16 and deemed a surgical candidate for aortic vavle replacement despite his high risk comorbidities. Patient reports he is planning to move forward with the surgery but has not yet been scheduled. Pending preop cardiac catheterization per Dr. Lovena Le.  -- Will continue to follow along  -- Appreciate CT surgery consultation

## 2016-12-10 NOTE — Assessment & Plan Note (Signed)
Blood pressure is uncontrolled today, 184/85. Patient reports compliance with all his medication. He did not bring his medications with him today, but is able to correctly report the names and doses of all of his medications. He is currently taking amlodipine 10 mg, Coreg 25 twice a day, hydralazine 100 mg 3 times a day, doxazosin 4 mg daily, and clonidine 0.1 mg when necessary. Patient reports that he checks his blood pressure at home and it's persistently elevated in the 088P systolic except during home dialysis when his blood pressure drops to the 150s. Today we will change his clonidine to a weekly patch and have him follow-up in one week to have his blood pressure rechecked. -- Continue amlodipine 10 mg -- Continue Coreg 25 twice a day -- Continue hydralazine 100 mg 3 times a day - Continue doxazosin 4 mg - Change clonidine 0.1 prn to 0.1 mg q weekly patch, follow-up one week for blood pressure recheck. -- Will request nephrology records

## 2016-12-10 NOTE — Progress Notes (Signed)
   CC: HTN follow up   HPI:  Jeffrey.Jeffrey [name] is a 61 y.o. male with past medical history of HTN, HIV, ESRD on home dialysis, COPD, and aortic insufficiency here for follow up. For the details of today's visit, please refer to the assessment and plan.  Past Medical History:  Diagnosis Date  . Anemia   . Anxiety   . Arthritis   . Asthma    per pt hx  . End stage renal disease on home HD 07/10/2011   Started HD in September 2012 at [facility] with a tunneled HD catheter, now on home HD with NxtStage. Dialyzing through AVF L lower arm with buttonhole technique as of mid 2014. His brother does the HD treatments at home.  They are roommates for 23 years.  The brother works 3rd shift and gets off about 8am and then puts Jeffrey [name] on HD in the morning after getting home. Most of the time he does HD about 4 times a week, for about 4 hours per treatment. Cause of ESRD was HTN according to patient. He says he let his health go and ending up with complications, and that he didn't like seeing doctors in those days.  He says he was diagnosed with severe HTN when he lived in [place] in his 62's.   . Gout   . Hepatitis B carrier (Jeffrey [name])   . HIV infection (Jeffrey [name])   . Hypertension   . Hyperthyroidism   . Pneumonia   . Seizure (Jeffrey [name])   . Seizures (Jeffrey [name]) 06/02/2011   pt denies this  . Thrombocytopenia (Jeffrey [name])     Review of Systems:  All pertinents listed in HPI, otherwise negative  Physical Exam:  Vitals:   12/10/16 1403 12/10/16 1431  BP: (!) 184/85 (!) 179/85  Pulse: 76 74  Temp: 98.2 F (36.8 C)   SpO2: 99%   Weight: 178 lb (80.7 kg)   Height: 6' (1.829 m)     Constitutional: NAD, appears comfortable Cardiovascular: RRR, 3/6 diastolic murmur at right upper sternal border Pulmonary/Chest: CTAB, no wheezes, rales, or rhonchi.  Extremities: Warm and well perfused. No edema.  Skin: Patch of dry and scaly skin skin in left groin area, no erythema  Psychiatric: Normal mood and affect  Assessment &  Plan:   See Encounters Tab for problem based charting.  Patient discussed with Dr. Angelia Mould

## 2016-12-11 ENCOUNTER — Other Ambulatory Visit: Payer: Self-pay | Admitting: Internal Medicine

## 2016-12-11 DIAGNOSIS — I10 Essential (primary) hypertension: Secondary | ICD-10-CM

## 2016-12-11 NOTE — Telephone Encounter (Signed)
[facility] has received rx; called [facility] yesterday to cancel rx per MD.

## 2016-12-11 NOTE — Telephone Encounter (Signed)
cloNIDine (CATAPRES - DOSED IN MG/24 HR) 0.1 mg/24hr patch, refill reqiest @ [facility] on [address].

## 2016-12-11 NOTE — Progress Notes (Signed)
Internal Medicine Clinic Attending  Case discussed with Dr. Philipp Ovens at the time of the visit.  We reviewed the resident's history and exam and pertinent patient test results.  I agree with the assessment, diagnosis, and plan of care documented in the resident's note. It is somewhat unclear to me why his HTN is so uncontrolled despite high doses of multiple antihypertensives.  I suspect there may be a degree of non-adherence.  We will change his clonidine to a patch and obtain Nephrology records and arrange close follow up.  At that follow up we will also assess if he is wearing the clonidine patch.  We may need to also get our clinical pharmacist involved.

## 2016-12-12 DIAGNOSIS — D631 Anemia in chronic kidney disease: Secondary | ICD-10-CM | POA: Diagnosis not present

## 2016-12-12 DIAGNOSIS — N186 End stage renal disease: Secondary | ICD-10-CM | POA: Diagnosis not present

## 2016-12-12 DIAGNOSIS — E8779 Other fluid overload: Secondary | ICD-10-CM | POA: Diagnosis not present

## 2016-12-12 DIAGNOSIS — Z79899 Other long term (current) drug therapy: Secondary | ICD-10-CM | POA: Diagnosis not present

## 2016-12-12 DIAGNOSIS — D509 Iron deficiency anemia, unspecified: Secondary | ICD-10-CM | POA: Diagnosis not present

## 2016-12-12 DIAGNOSIS — E213 Hyperparathyroidism, unspecified: Secondary | ICD-10-CM | POA: Diagnosis not present

## 2016-12-13 DIAGNOSIS — E8779 Other fluid overload: Secondary | ICD-10-CM | POA: Diagnosis not present

## 2016-12-13 DIAGNOSIS — E213 Hyperparathyroidism, unspecified: Secondary | ICD-10-CM | POA: Diagnosis not present

## 2016-12-13 DIAGNOSIS — D631 Anemia in chronic kidney disease: Secondary | ICD-10-CM | POA: Diagnosis not present

## 2016-12-13 DIAGNOSIS — Z79899 Other long term (current) drug therapy: Secondary | ICD-10-CM | POA: Diagnosis not present

## 2016-12-13 DIAGNOSIS — D509 Iron deficiency anemia, unspecified: Secondary | ICD-10-CM | POA: Diagnosis not present

## 2016-12-13 DIAGNOSIS — N186 End stage renal disease: Secondary | ICD-10-CM | POA: Diagnosis not present

## 2016-12-14 ENCOUNTER — Other Ambulatory Visit: Payer: Medicare Other

## 2016-12-14 DIAGNOSIS — N186 End stage renal disease: Secondary | ICD-10-CM | POA: Diagnosis not present

## 2016-12-14 DIAGNOSIS — Z79899 Other long term (current) drug therapy: Secondary | ICD-10-CM | POA: Diagnosis not present

## 2016-12-14 DIAGNOSIS — E8779 Other fluid overload: Secondary | ICD-10-CM | POA: Diagnosis not present

## 2016-12-14 DIAGNOSIS — D509 Iron deficiency anemia, unspecified: Secondary | ICD-10-CM | POA: Diagnosis not present

## 2016-12-14 DIAGNOSIS — D631 Anemia in chronic kidney disease: Secondary | ICD-10-CM | POA: Diagnosis not present

## 2016-12-14 DIAGNOSIS — E213 Hyperparathyroidism, unspecified: Secondary | ICD-10-CM | POA: Diagnosis not present

## 2016-12-15 ENCOUNTER — Telehealth: Payer: Self-pay | Admitting: Internal Medicine

## 2016-12-15 DIAGNOSIS — E8779 Other fluid overload: Secondary | ICD-10-CM | POA: Diagnosis not present

## 2016-12-15 DIAGNOSIS — D631 Anemia in chronic kidney disease: Secondary | ICD-10-CM | POA: Diagnosis not present

## 2016-12-15 DIAGNOSIS — N186 End stage renal disease: Secondary | ICD-10-CM | POA: Diagnosis not present

## 2016-12-15 DIAGNOSIS — Z992 Dependence on renal dialysis: Secondary | ICD-10-CM | POA: Diagnosis not present

## 2016-12-15 DIAGNOSIS — E213 Hyperparathyroidism, unspecified: Secondary | ICD-10-CM | POA: Diagnosis not present

## 2016-12-15 DIAGNOSIS — Z79899 Other long term (current) drug therapy: Secondary | ICD-10-CM | POA: Diagnosis not present

## 2016-12-15 DIAGNOSIS — T82858A Stenosis of vascular prosthetic devices, implants and grafts, initial encounter: Secondary | ICD-10-CM | POA: Diagnosis not present

## 2016-12-15 DIAGNOSIS — I871 Compression of vein: Secondary | ICD-10-CM | POA: Diagnosis not present

## 2016-12-15 DIAGNOSIS — D509 Iron deficiency anemia, unspecified: Secondary | ICD-10-CM | POA: Diagnosis not present

## 2016-12-15 NOTE — Telephone Encounter (Signed)
APT. REMINDER CALL, LMTCB °

## 2016-12-16 ENCOUNTER — Ambulatory Visit: Payer: Medicare Other

## 2016-12-16 DIAGNOSIS — D509 Iron deficiency anemia, unspecified: Secondary | ICD-10-CM | POA: Diagnosis not present

## 2016-12-16 DIAGNOSIS — N186 End stage renal disease: Secondary | ICD-10-CM | POA: Diagnosis not present

## 2016-12-16 DIAGNOSIS — D631 Anemia in chronic kidney disease: Secondary | ICD-10-CM | POA: Diagnosis not present

## 2016-12-16 DIAGNOSIS — Z79899 Other long term (current) drug therapy: Secondary | ICD-10-CM | POA: Diagnosis not present

## 2016-12-16 DIAGNOSIS — E213 Hyperparathyroidism, unspecified: Secondary | ICD-10-CM | POA: Diagnosis not present

## 2016-12-16 DIAGNOSIS — E8779 Other fluid overload: Secondary | ICD-10-CM | POA: Diagnosis not present

## 2016-12-17 DIAGNOSIS — E8779 Other fluid overload: Secondary | ICD-10-CM | POA: Diagnosis not present

## 2016-12-17 DIAGNOSIS — N186 End stage renal disease: Secondary | ICD-10-CM | POA: Diagnosis not present

## 2016-12-17 DIAGNOSIS — D509 Iron deficiency anemia, unspecified: Secondary | ICD-10-CM | POA: Diagnosis not present

## 2016-12-17 DIAGNOSIS — D631 Anemia in chronic kidney disease: Secondary | ICD-10-CM | POA: Diagnosis not present

## 2016-12-17 DIAGNOSIS — E213 Hyperparathyroidism, unspecified: Secondary | ICD-10-CM | POA: Diagnosis not present

## 2016-12-17 DIAGNOSIS — Z79899 Other long term (current) drug therapy: Secondary | ICD-10-CM | POA: Diagnosis not present

## 2016-12-18 DIAGNOSIS — N186 End stage renal disease: Secondary | ICD-10-CM | POA: Diagnosis not present

## 2016-12-18 DIAGNOSIS — Z79899 Other long term (current) drug therapy: Secondary | ICD-10-CM | POA: Diagnosis not present

## 2016-12-18 DIAGNOSIS — D631 Anemia in chronic kidney disease: Secondary | ICD-10-CM | POA: Diagnosis not present

## 2016-12-18 DIAGNOSIS — D509 Iron deficiency anemia, unspecified: Secondary | ICD-10-CM | POA: Diagnosis not present

## 2016-12-18 DIAGNOSIS — E8779 Other fluid overload: Secondary | ICD-10-CM | POA: Diagnosis not present

## 2016-12-18 DIAGNOSIS — E213 Hyperparathyroidism, unspecified: Secondary | ICD-10-CM | POA: Diagnosis not present

## 2016-12-19 DIAGNOSIS — N186 End stage renal disease: Secondary | ICD-10-CM | POA: Diagnosis not present

## 2016-12-19 DIAGNOSIS — I120 Hypertensive chronic kidney disease with stage 5 chronic kidney disease or end stage renal disease: Secondary | ICD-10-CM | POA: Diagnosis not present

## 2016-12-19 DIAGNOSIS — Z992 Dependence on renal dialysis: Secondary | ICD-10-CM | POA: Diagnosis not present

## 2016-12-21 DIAGNOSIS — E8779 Other fluid overload: Secondary | ICD-10-CM | POA: Diagnosis not present

## 2016-12-21 DIAGNOSIS — D509 Iron deficiency anemia, unspecified: Secondary | ICD-10-CM | POA: Diagnosis not present

## 2016-12-21 DIAGNOSIS — D631 Anemia in chronic kidney disease: Secondary | ICD-10-CM | POA: Diagnosis not present

## 2016-12-21 DIAGNOSIS — N186 End stage renal disease: Secondary | ICD-10-CM | POA: Diagnosis not present

## 2016-12-21 DIAGNOSIS — N2581 Secondary hyperparathyroidism of renal origin: Secondary | ICD-10-CM | POA: Diagnosis not present

## 2016-12-22 DIAGNOSIS — N186 End stage renal disease: Secondary | ICD-10-CM | POA: Diagnosis not present

## 2016-12-22 DIAGNOSIS — D509 Iron deficiency anemia, unspecified: Secondary | ICD-10-CM | POA: Diagnosis not present

## 2016-12-22 DIAGNOSIS — D631 Anemia in chronic kidney disease: Secondary | ICD-10-CM | POA: Diagnosis not present

## 2016-12-22 DIAGNOSIS — E8779 Other fluid overload: Secondary | ICD-10-CM | POA: Diagnosis not present

## 2016-12-22 DIAGNOSIS — N2581 Secondary hyperparathyroidism of renal origin: Secondary | ICD-10-CM | POA: Diagnosis not present

## 2016-12-23 ENCOUNTER — Ambulatory Visit (INDEPENDENT_AMBULATORY_CARE_PROVIDER_SITE_OTHER): Payer: Medicare Other | Admitting: Internal Medicine

## 2016-12-23 ENCOUNTER — Other Ambulatory Visit: Payer: Self-pay | Admitting: Internal Medicine

## 2016-12-23 VITALS — BP 135/66 | HR 65 | Temp 98.0°F | Ht 72.0 in | Wt 176.2 lb

## 2016-12-23 DIAGNOSIS — T82510D Breakdown (mechanical) of surgically created arteriovenous fistula, subsequent encounter: Secondary | ICD-10-CM

## 2016-12-23 DIAGNOSIS — Z992 Dependence on renal dialysis: Secondary | ICD-10-CM | POA: Diagnosis not present

## 2016-12-23 DIAGNOSIS — D631 Anemia in chronic kidney disease: Secondary | ICD-10-CM | POA: Diagnosis not present

## 2016-12-23 DIAGNOSIS — Y712 Prosthetic and other implants, materials and accessory cardiovascular devices associated with adverse incidents: Secondary | ICD-10-CM | POA: Diagnosis not present

## 2016-12-23 DIAGNOSIS — N2581 Secondary hyperparathyroidism of renal origin: Secondary | ICD-10-CM | POA: Diagnosis not present

## 2016-12-23 DIAGNOSIS — L309 Dermatitis, unspecified: Secondary | ICD-10-CM | POA: Diagnosis not present

## 2016-12-23 DIAGNOSIS — N186 End stage renal disease: Secondary | ICD-10-CM

## 2016-12-23 DIAGNOSIS — T82898A Other specified complication of vascular prosthetic devices, implants and grafts, initial encounter: Secondary | ICD-10-CM

## 2016-12-23 DIAGNOSIS — T82898D Other specified complication of vascular prosthetic devices, implants and grafts, subsequent encounter: Secondary | ICD-10-CM

## 2016-12-23 DIAGNOSIS — I10 Essential (primary) hypertension: Secondary | ICD-10-CM

## 2016-12-23 DIAGNOSIS — Z87891 Personal history of nicotine dependence: Secondary | ICD-10-CM

## 2016-12-23 DIAGNOSIS — E8779 Other fluid overload: Secondary | ICD-10-CM | POA: Diagnosis not present

## 2016-12-23 DIAGNOSIS — T82510A Breakdown (mechanical) of surgically created arteriovenous fistula, initial encounter: Secondary | ICD-10-CM | POA: Insufficient documentation

## 2016-12-23 DIAGNOSIS — D509 Iron deficiency anemia, unspecified: Secondary | ICD-10-CM | POA: Diagnosis not present

## 2016-12-23 MED ORDER — HYDROXYZINE HCL 10 MG PO TABS: 10.0000 mg | ORAL_TABLET | Freq: Three times a day (TID) | ORAL | 0 refills | Status: AC | PRN

## 2016-12-23 MED ORDER — HYDROXYZINE HCL 10 MG PO TABS: 10.0000 mg | ORAL_TABLET | Freq: Three times a day (TID) | ORAL | 0 refills | Status: DC | PRN

## 2016-12-23 NOTE — Progress Notes (Unsigned)
   CC: Follow up for hypertension  HPI:  Mr.Lashan EYTHAN JAYNE is a 61 y.o. man here after one week for follow up of changes to his hypertension management and for his complaint of groin rash.   See problem based assessment and plan below for additional details  Past Medical History:  Diagnosis Date  . Anemia   . Anxiety   . Arthritis   . Asthma    per pt hx  . End stage renal disease on home HD 07/10/2011   Started HD in September 2012 at [facility] with a tunneled HD catheter, now on home HD with NxtStage. Dialyzing through AVF L lower arm with buttonhole technique as of mid 2014. His brother does the HD treatments at home.  They are roommates for 23 years.  The brother works 3rd shift and gets off about 8am and then puts Mr Robarge on HD in the morning after getting home. Most of the time he does HD about 4 times a week, for about 4 hours per treatment. Cause of ESRD was HTN according to patient. He says he let his health go and ending up with complications, and that he didn't like seeing doctors in those days.  He says he was diagnosed with severe HTN when he lived in [place] in his 57's.   . Gout   . Hepatitis B carrier ([place])   . HIV infection ([place])   . Hypertension   . Hyperthyroidism   . Pneumonia   . Seizure ([place])   . Seizures ([place]) 06/02/2011   pt denies this  . Thrombocytopenia ([place])     Review of Systems:  ROS  Physical Exam: Physical Exam  There were no vitals filed for this visit. ***  Assessment & Plan:   See Encounters Tab for problem based charting.  Patient {GC/GE:[id number]::"discussed with","seen with"} Dr. {NAMES:[id number]::"Butcher","Granfortuna","E. Hoffman","Klima","Mullen","Narendra","Vincent"}

## 2016-12-23 NOTE — Patient Instructions (Signed)
It was a pleasure to see you today Jeffrey [name].  I am happy your blood pressure has improved so much with the recent change to clonidine patch and adjusting dialysis weights. I recommend continuing the current treatments for now.  I sent a prescription for Vistaril (hydroxyzine) for itching while we wait for your rash to improve. Take 1 tablet (10mg ) up to 3 times daily as needed. You should also continue to use moisturizing treatments such as eucerin or cetaphil daily on the affected area.  We will need to keep track of your weight change and blood pressure again but can follow up at a normal time from now with your regular doctor.  I will pass along your kind comments to Dr. Tobi Bastos.

## 2016-12-23 NOTE — Progress Notes (Signed)
   CC: Follow up for hypertension  HPI:  Jeffrey.Jeffrey [name] is a 61 y.o. man here today for one week follow up after changes to his hypertension treatment one week ago.   See problem based assessment and plan below for additional details  Past Medical History:  Diagnosis Date  . Anemia   . Anxiety   . Arthritis   . Asthma    per pt hx  . End stage renal disease on home HD 07/10/2011   Started HD in September 2012 at [facility] with a tunneled HD catheter, now on home HD with NxtStage. Dialyzing through AVF L lower arm with buttonhole technique as of mid 2014. His brother does the HD treatments at home.  They are roommates for 23 years.  The brother works 3rd shift and gets off about 8am and then puts Jeffrey [name] on HD in the morning after getting home. Most of the time he does HD about 4 times a week, for about 4 hours per treatment. Cause of ESRD was HTN according to patient. He says he let his health go and ending up with complications, and that he didn't like seeing doctors in those days.  He says he was diagnosed with severe HTN when he lived in [place] in his 47's.   . Gout   . Hepatitis B carrier ([place])   . HIV infection ([place])   . Hypertension   . Hyperthyroidism   . Pneumonia   . Seizure ([name])   . Seizures ([place]) 06/02/2011   pt denies this  . Thrombocytopenia ([place])     Review of Systems:  Review of Systems  Constitutional: Negative for malaise/fatigue.  Respiratory: Negative for shortness of breath.   Cardiovascular: Negative for leg swelling.  Gastrointestinal: Negative for nausea.  Skin: Positive for itching.  Endo/Heme/Allergies: Does not bruise/bleed easily.    Physical Exam: Physical Exam  Constitutional:  Thin man in no acute distress  HENT:  Mouth/Throat: No oropharyngeal exudate.  Cardiovascular: Normal rate and regular rhythm.   Pulmonary/Chest: Effort normal and breath sounds normal.  Musculoskeletal: He exhibits no edema.  Approximately 5cm diameter firm,  warm, nontender fluid collection in LUE just proximal to AVF, AVF has palpable thrill  Skin: Skin is warm and dry. No rash noted.  Pruritic area over left groin without significant erythema, some excoriations present    Vitals:   12/23/16 1327  BP: 135/66  Pulse: 65  Temp: 98 F (36.7 C)  SpO2: 99%  Weight: 176 lb 3.2 oz (79.9 kg)  Height: 6' (1.829 m)     Assessment & Plan:   See Encounters Tab for problem based charting.  Patient discussed with Dr. Dareen Piano

## 2016-12-24 DIAGNOSIS — D631 Anemia in chronic kidney disease: Secondary | ICD-10-CM | POA: Diagnosis not present

## 2016-12-24 DIAGNOSIS — N186 End stage renal disease: Secondary | ICD-10-CM | POA: Diagnosis not present

## 2016-12-24 DIAGNOSIS — N2581 Secondary hyperparathyroidism of renal origin: Secondary | ICD-10-CM | POA: Diagnosis not present

## 2016-12-24 DIAGNOSIS — E8779 Other fluid overload: Secondary | ICD-10-CM | POA: Diagnosis not present

## 2016-12-24 DIAGNOSIS — D509 Iron deficiency anemia, unspecified: Secondary | ICD-10-CM | POA: Diagnosis not present

## 2016-12-25 DIAGNOSIS — N2581 Secondary hyperparathyroidism of renal origin: Secondary | ICD-10-CM | POA: Diagnosis not present

## 2016-12-25 DIAGNOSIS — D631 Anemia in chronic kidney disease: Secondary | ICD-10-CM | POA: Diagnosis not present

## 2016-12-25 DIAGNOSIS — N186 End stage renal disease: Secondary | ICD-10-CM | POA: Diagnosis not present

## 2016-12-25 DIAGNOSIS — E8779 Other fluid overload: Secondary | ICD-10-CM | POA: Diagnosis not present

## 2016-12-25 DIAGNOSIS — D509 Iron deficiency anemia, unspecified: Secondary | ICD-10-CM | POA: Diagnosis not present

## 2016-12-25 NOTE — Assessment & Plan Note (Addendum)
HPI: Jeffrey [name] has had very longstanding uncontrolled hypertension. This was elevated at 180/80s at clinic visit on 3/22. He has since changed clonidine to a transdermal patch to help in more consistent administration. He has also adjusted his home hemodialysis dry weight down another 2kg. There is mild associated fatigue or lightheadedness on dialysis trying to adjust more than this but he is tolerating the current target without problems.  A: Longstanding poorly controlled hypertension now improved with transdermal clonidine and more volume removal on HD At this point he can return to clinic on a routine interval or as needed  P: Continue current treatment: -Amlodipine 10 mg daily -Coreg 25 mg twice daily -hydralazine 100 mg 3 times daily -doxazosin 4 mg daily -clonidine 0.1 mg transdermal weekly -Continue routine f/u with nephrology for HD adjustments

## 2016-12-25 NOTE — Assessment & Plan Note (Signed)
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 It has been present over a week but is now much increased in size. This is mildly tender but mostly bothersome due to the swelling and concern it might cause problems for home HD. He is already scheduled for follow up with [facility] or vascular surgery later in the week to assess this for intervention options.

## 2016-12-25 NOTE — Assessment & Plan Note (Signed)
HPI: Area of left groin itching remains present today. Skin is slightly dry but no obvious erythema or changes suggesting increased inflammation compared to 2 weeks ago. He reports using topical emollient daily but is still having significant itching.  A: Probably atopic dermatitis on left groin  P: Recommend continue use of topical emollients daily especially after washing Prescribed hydroxyzine 10 mg up to 3 times daily as needed for itching symptoms

## 2016-12-28 ENCOUNTER — Ambulatory Visit: Payer: Medicare Other | Admitting: Infectious Diseases

## 2016-12-28 DIAGNOSIS — D509 Iron deficiency anemia, unspecified: Secondary | ICD-10-CM | POA: Diagnosis not present

## 2016-12-28 DIAGNOSIS — M109 Gout, unspecified: Secondary | ICD-10-CM | POA: Diagnosis not present

## 2016-12-28 DIAGNOSIS — N186 End stage renal disease: Secondary | ICD-10-CM | POA: Diagnosis not present

## 2016-12-28 DIAGNOSIS — E8779 Other fluid overload: Secondary | ICD-10-CM | POA: Diagnosis not present

## 2016-12-28 DIAGNOSIS — N2581 Secondary hyperparathyroidism of renal origin: Secondary | ICD-10-CM | POA: Diagnosis not present

## 2016-12-28 DIAGNOSIS — E785 Hyperlipidemia, unspecified: Secondary | ICD-10-CM | POA: Diagnosis not present

## 2016-12-28 DIAGNOSIS — D631 Anemia in chronic kidney disease: Secondary | ICD-10-CM | POA: Diagnosis not present

## 2016-12-29 DIAGNOSIS — E8779 Other fluid overload: Secondary | ICD-10-CM | POA: Diagnosis not present

## 2016-12-29 DIAGNOSIS — D631 Anemia in chronic kidney disease: Secondary | ICD-10-CM | POA: Diagnosis not present

## 2016-12-29 DIAGNOSIS — D509 Iron deficiency anemia, unspecified: Secondary | ICD-10-CM | POA: Diagnosis not present

## 2016-12-29 DIAGNOSIS — N2581 Secondary hyperparathyroidism of renal origin: Secondary | ICD-10-CM | POA: Diagnosis not present

## 2016-12-29 DIAGNOSIS — N186 End stage renal disease: Secondary | ICD-10-CM | POA: Diagnosis not present

## 2016-12-29 NOTE — Progress Notes (Signed)
Internal Medicine Clinic Attending  Case discussed with Dr. Rice at the time of the visit.  We reviewed the resident's history and exam and pertinent patient test results.  I agree with the assessment, diagnosis, and plan of care documented in the resident's note.  

## 2016-12-30 DIAGNOSIS — N186 End stage renal disease: Secondary | ICD-10-CM | POA: Diagnosis not present

## 2016-12-30 DIAGNOSIS — D509 Iron deficiency anemia, unspecified: Secondary | ICD-10-CM | POA: Diagnosis not present

## 2016-12-30 DIAGNOSIS — N2581 Secondary hyperparathyroidism of renal origin: Secondary | ICD-10-CM | POA: Diagnosis not present

## 2016-12-30 DIAGNOSIS — E8779 Other fluid overload: Secondary | ICD-10-CM | POA: Diagnosis not present

## 2016-12-30 DIAGNOSIS — D631 Anemia in chronic kidney disease: Secondary | ICD-10-CM | POA: Diagnosis not present

## 2016-12-31 ENCOUNTER — Encounter: Payer: Self-pay | Admitting: Hematology

## 2016-12-31 ENCOUNTER — Ambulatory Visit (HOSPITAL_BASED_OUTPATIENT_CLINIC_OR_DEPARTMENT_OTHER): Payer: Medicare Other | Admitting: Hematology

## 2016-12-31 VITALS — BP 153/77 | HR 71 | Temp 98.5°F | Resp 18 | Ht 72.0 in | Wt 178.5 lb

## 2016-12-31 DIAGNOSIS — N189 Chronic kidney disease, unspecified: Secondary | ICD-10-CM | POA: Diagnosis not present

## 2016-12-31 DIAGNOSIS — Z992 Dependence on renal dialysis: Secondary | ICD-10-CM

## 2016-12-31 DIAGNOSIS — B20 Human immunodeficiency virus [HIV] disease: Secondary | ICD-10-CM

## 2016-12-31 DIAGNOSIS — D631 Anemia in chronic kidney disease: Secondary | ICD-10-CM | POA: Diagnosis not present

## 2016-12-31 DIAGNOSIS — N2581 Secondary hyperparathyroidism of renal origin: Secondary | ICD-10-CM | POA: Diagnosis not present

## 2016-12-31 DIAGNOSIS — D61818 Other pancytopenia: Secondary | ICD-10-CM

## 2016-12-31 DIAGNOSIS — D509 Iron deficiency anemia, unspecified: Secondary | ICD-10-CM

## 2016-12-31 DIAGNOSIS — D72819 Decreased white blood cell count, unspecified: Secondary | ICD-10-CM | POA: Diagnosis not present

## 2016-12-31 DIAGNOSIS — E8779 Other fluid overload: Secondary | ICD-10-CM | POA: Diagnosis not present

## 2016-12-31 DIAGNOSIS — N186 End stage renal disease: Secondary | ICD-10-CM | POA: Diagnosis not present

## 2017-01-01 ENCOUNTER — Telehealth: Payer: Self-pay | Admitting: Hematology

## 2017-01-01 DIAGNOSIS — D631 Anemia in chronic kidney disease: Secondary | ICD-10-CM | POA: Diagnosis not present

## 2017-01-01 DIAGNOSIS — D509 Iron deficiency anemia, unspecified: Secondary | ICD-10-CM | POA: Diagnosis not present

## 2017-01-01 DIAGNOSIS — N2581 Secondary hyperparathyroidism of renal origin: Secondary | ICD-10-CM | POA: Diagnosis not present

## 2017-01-01 DIAGNOSIS — E8779 Other fluid overload: Secondary | ICD-10-CM | POA: Diagnosis not present

## 2017-01-01 DIAGNOSIS — N186 End stage renal disease: Secondary | ICD-10-CM | POA: Diagnosis not present

## 2017-01-01 NOTE — Telephone Encounter (Signed)
4/12 los - none

## 2017-01-03 NOTE — Progress Notes (Signed)
Jeffrey [name] Kitchen    HEMATOLOGY/ONCOLOGY CONSULTATION NOTE  Date of Service: 01/03/2017  Patient Care Team: Velna Ochs, MD as PCP - General (Internal Medicine) Campbell Riches, MD as PCP - Infectious Diseases (Infectious Diseases) Nephrologist: Dr Pearson Grippe MD   CHIEF COMPLAINTS/PURPOSE OF CONSULTATION:  Leucopenia/Pancytopenia  HISTORY OF PRESENTING ILLNESS:   Jeffrey [name] is a wonderful 61 y.o. male who has been referred to [name] by Dr Pearson Grippe MD for evaluation and management of leucopenia.  Patient has multiple medical comorbidities including HIV, hepatitis B carrier, hypertension, dyslipidemia, thrombocytopenia and anemia, seizure disorder, end-stage renal disease on home dialysis, asthma.  Patient had recent labs with his Dr. Marylou Flesher on 10/27/2016 that showed leukopenia with a WBC count of 2000 and neutrophils of 42%, hemoglobin of 8.3 with an MCV of 101 and RDW of 17.8 and platelet counts of 126k. He also previously has had WBC counts of 2.3k on 09/22/2016 and 3k on 09/02/2017 with radiating degrees of anemia ranging from 6.9-8.8. And platelet counts of 126-225k.  Patient notes minimal acute fevers or chills at this time. It goes that he has been on HIV medications for 4 years with no recent new medications. He follows with Dr. Johnnye Sima for HIV treatment.  He did have a severe bronchial infection in November 2017 and was admitted to the hospital for acute shortness of breath on 09/09/2016. Has history of severe aortic insufficiency which could also be an element involvement.  Patient notes that he has been following up with nephrology for iron replacement with Venofer once weekly and ESA's every 2 weeks  Notes no abdominal pain. Getting home dialysis regularly.  No other focal symptoms suggestive of infection at this time.  INTERVAL HISTORY  Patient is here with his brother to follow up on his testing results. He notes that his last hemoglobin was more than 12 and his ESA's have  been held.  fevers or chills. We discussed in detail his workup results for pancytopenia which has now predominantly resolved .   MEDICAL HISTORY:  Past Medical History:  Diagnosis Date  . Anemia   . Anxiety   . Arthritis   . Asthma    per pt hx  . End stage renal disease on home HD 07/10/2011   Started HD in September 2012 at [facility] with a tunneled HD catheter, now on home HD with NxtStage. Dialyzing through AVF L lower arm with buttonhole technique as of mid 2014. His brother does the HD treatments at home.  They are roommates for 23 years.  The brother works 3rd shift and gets off about 8am and then puts Jeffrey [name] on HD in the morning after getting home. Most of the time he does HD about 4 times a week, for about 4 hours per treatment. Cause of ESRD was HTN according to patient. He says he let his health go and ending up with complications, and that he didn't like seeing doctors in those days.  He says he was diagnosed with severe HTN when he lived in [place] in his 67's.   . Gout   . Hepatitis B carrier ([place])   . HIV infection ([place])   . Hypertension   . Hyperthyroidism   . Pneumonia   . Seizure ([place])   . Seizures ([place]) 06/02/2011   pt denies this  . Thrombocytopenia ([place])     SURGICAL HISTORY: Past Surgical History:  Procedure Laterality Date  . AV FISTULA PLACEMENT  06/02/11   Left radiocephalic AVF  .  COLONOSCOPY    . OTHER SURGICAL HISTORY     removal temporary HD catheter   . REVISON OF ARTERIOVENOUS FISTULA Left 10/10/2015   Procedure: REVISON OF LEFT RADIOCEPHALIC ARTERIOVENOUS FISTULA;  Surgeon: Angelia Mould, MD;  Location: [facility];  Service: Vascular;  Laterality: Left;  . REVISON OF ARTERIOVENOUS FISTULA Left 02/07/2016   Procedure: REPAIR OF PSEUDO-ANEUREYSM OF LEFT ARM  ARTERIOVENOUS FISTULA;  Surgeon: Angelia Mould, MD;  Location: [facility];  Service: Vascular;  Laterality: Left;  . REVISON OF ARTERIOVENOUS FISTULA Left 10/31/4707   Procedure: PLICATION OF  LEFT ARM RADIOCEPHALIC ARTERIOVENOUS FISTULA PSEUDOANEURYSM;  Surgeon: Angelia Mould, MD;  Location: [facility];  Service: Vascular;  Laterality: Left;  . TEE WITHOUT CARDIOVERSION N/A 09/11/2016   Procedure: TRANSESOPHAGEAL ECHOCARDIOGRAM (TEE);  Surgeon: Dorothy Spark, MD;  Location: [facility] END[facility]COPY;  Service: Cardiovascular;  Laterality: N/A;    SOCIAL HISTORY: Social History   Social History  . Marital status: Single    Spouse name: N/A  . Number of children: N/A  . Years of education: N/A   Occupational History  . Not on file.   Social History Main Topics  . Smoking status: Former Smoker    Types: Cigarettes    Quit date: 08/08/2015  . Smokeless tobacco: Never Used  . Alcohol use No  . Drug use: No     Comment: uds (+) cocaine in 08/2011 but pt states was taking sudafed at the time  . Sexual activity: Not Currently    Partners: Male   Other Topics Concern  . 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 Hypertension Mother   . Diabetes Mother   . Cancer Father     ALLERGIES:  is allergic to lisinopril and penicillins.  MEDICATIONS:  Current Outpatient Prescriptions  Medication Sig Dispense Refill  . albuterol (PROVENTIL) (2.5 MG/3ML) 0.083% nebulizer solution Take 2.5 mg by nebulization every 6 (six) hours as needed for wheezing or shortness of breath.    . ALPRAZolam (XANAX) 0.25 MG tablet TK 1 T PO  ONCE D PRN  0  . amLODipine (NORVASC) 10 MG tablet Take 5 mg by mouth 2 (two) times daily.     . calcium acetate (PHOSLO) 667 MG capsule Take 667-2,001 mg by mouth See admin instructions. 1,334-2,001 mg three times a day with each meal and 667-1,334 mg with each snack    . carvedilol (COREG) 25 MG tablet Take 25 mg by mouth 2  (two) times daily with a meal.    . cloNIDine (CATAPRES - DOSED IN MG/24 HR) 0.1 mg/24hr patch Place 1 patch (0.1 mg total) onto the skin every 7 (seven) days. 4 patch 2  . COMBIVENT RESPIMAT 20-100 MCG/ACT AERS respimat INHALE 1 PUFF BY MOUTH EVERY 4 HOURS AS NEEDED FOR WHEEZING OR SHORTNESS OF BREATH 4 g 0  . doxazosin (CARDURA) 2 MG tablet Take 4 mg by mouth daily.  11  . EPIVIR HBV 5 MG/ML solution TAKE 5 ML BY MOUTH EVERY DAY 240 mL 11  . ethyl chloride spray Apply 1 application topically daily as needed. At HD  12  . hydrALAZINE (APRESOLINE) 100 MG tablet Take 100 mg by mouth  3 (three) times daily.     . hydrOXYzine (ATARAX/VISTARIL) 10 MG tablet Take 1 tablet (10 mg total) by mouth 3 (three) times daily as needed for itching. 60 tablet 0  . ISENTRESS 400 MG tablet TAKE 1 TABLET(400 MG) BY MOUTH TWICE DAILY 60 tablet 6  . multivitamin (RENA-VIT) TABS tablet Take 1 tablet by mouth daily.      Jeffrey [name] Kitchen oxyCODONE-acetaminophen (PERCOCET/ROXICET) 5-325 MG tablet Take 1 tablet by mouth every 8 (eight) hours as needed for moderate pain or severe pain. 10 tablet 0  . tenofovir (VIREAD) 300 MG tablet TAKE 1 TABLET BY MOUTH EVERY SATURDAY AFTER DIALYSIS 5 tablet 5  . traZODone (DESYREL) 50 MG tablet Take 50 mg by mouth at bedtime as needed for sleep.     No current facility-administered medications for this visit.     REVIEW OF SYSTEMS:    10 Point review of Systems was done is negative except as noted above.  PHYSICAL EXAMINATION: ECOG PERFORMANCE STATUS: 1 - Symptomatic but completely ambulatory  . Vitals:   12/31/16 1359  BP: (!) 153/77  Pulse: 71  Resp: 18  Temp: 98.5 F (36.9 C)   Filed Weights   12/31/16 1359  Weight: 178 lb 8 oz (81 kg)   .Body mass index is 24.21 kg/m.  GENERAL:alert, in no acute distress and comfortable SKIN: No acute rashes  EYES: normal, conjunctiva are pink and non-injected, sclera clear OROPHARYNX:no exudate, no erythema and lips, buccal mucosa, and  tongue normal  NECK: supple, no JVD, thyroid normal size, non-tender, without nodularity LYMPH:  no palpable lymphadenopathy in the cervical, axillary or inguinal LUNGS: clear to auscultation with normal respiratory effort HEART: regular rate & rhythm,  2/6 diastolic murmur over aortic area and bilateral trace pedal edema ABDOMEN: abdomen soft, non-tender, normoactive bowel sounds  Musculoskeletal: no cyanosis of digits and no clubbing  PSYCH: alert & oriented x 3 with fluent speech NEURO: no focal motor/sensory deficits  LABORATORY DATA:  I have reviewed the data as listed  . CBC Latest Ref Rng & Units 11/18/2016 11/18/2016 09/12/2016  WBC 4.0 - 10.3 10e3/uL 5.2 - 8.1  Hemoglobin 13.0 - 17.1 g/dL 10.0(L) - 8.0(L)  Hematocrit 37.5 - 51.0 % 29.9(L) 29.9(L) 24.8(L)  Platelets 140 - 400 10e3/uL 166 - 166   . CBC    Component Value Date/Time   WBC 5.2 11/18/2016 1036   WBC 8.1 09/12/2016 0614   RBC 3.12 (L) 11/18/2016 1036   RBC 2.72 (L) 09/12/2016 0614   HGB 10.0 (L) 11/18/2016 1036   HCT 29.9 (L) 11/18/2016 1036   HCT 29.9 (L) 11/18/2016 1036   PLT 166 11/18/2016 1036   MCV 95.6 11/18/2016 1036   MCH 32.0 11/18/2016 1036   MCH 29.4 09/12/2016 0614   MCHC 33.4 11/18/2016 1036   MCHC 32.3 09/12/2016 0614   RDW 18.1 (H) 11/18/2016 1036   LYMPHSABS 1.2 11/18/2016 1036   MONOABS 0.6 11/18/2016 1036   EOSABS 0.3 11/18/2016 1036   BASOSABS 0.1 11/18/2016 1036   ANC 3.2k   CMP Latest Ref Rng & Units 11/18/2016 11/18/2016 09/12/2016  Glucose 70 - 140 mg/dl 95 - 95  BUN 7.0 - 26.0 mg/dL 25.7 - 22(H)  Creatinine 0.7 - 1.3 mg/dL 6.6(HH) - 4.27(H)  Sodium 136 - 145 mEq/L 136 - 135  Potassium 3.5 - 5.1 mEq/L 3.3(L) - 3.5  Chloride 101 - 111 mmol/L - - 96(L)  CO2 22 - 29 mEq/L 29 - 31  Calcium 8.4 - 10.4  mg/dL 9.5 - 9.1  Total Protein 6.0 - 8.5 g/dL 7.9 7.4 -  Total Bilirubin 0.20 - 1.20 mg/dL 0.69 - -  Alkaline Phos 40 - 150 U/L 68 - -  AST 5 - 34 U/L 15 - -  ALT 0-55 U/L U/L  <6 - -    Component     Latest Ref Rng & Units 11/18/2016  IgG (Immunoglobin G), Serum     700 - 1600 mg/dL 1,802 (H)  IgA/Immunoglobulin A, Serum     61 - 437 mg/dL 115  IgM, Qn, Serum     20 - 172 mg/dL 40  Total Protein     6.0 - 8.5 g/dL 7.4  Albumin SerPl Elph-Mcnc     2.9 - 4.4 g/dL 4.1  Alpha 1     0.0 - 0.4 g/dL 0.3  Alpha2 Glob SerPl Elph-Mcnc     0.4 - 1.0 g/dL 0.6  B-Globulin SerPl Elph-Mcnc     0.7 - 1.3 g/dL 0.7  Gamma Glob SerPl Elph-Mcnc     0.4 - 1.8 g/dL 1.8  M Protein SerPl Elph-Mcnc     Not Observed g/dL Not Observed  Globulin, Total     2.2 - 3.9 g/dL 3.3  Albumin/Glob SerPl     0.7 - 1.7 1.3  IFE 1      Comment  Please Note (HCV):      Comment  Folate, Hemolysate     Not Estab. ng/mL >620.0  HCT     37.5 - 51.0 % 29.9 (L)  Folate, RBC     >498 ng/mL >2074  Ig Kappa Free Light Chain     3.3 - 19.4 mg/L 277.6 (H)  Ig Lambda Free Light Chain     5.7 - 26.3 mg/L 115.3 (H)  Kappa/Lambda FluidC Ratio     0.26 - 1.65 2.41 (H)  LDH     125 - 245 U/L 193  Sed Rate     0 - 30 mm/hr 13  Haptoglobin     34 - 200 mg/dL 93  EBV VCA IgM     0.0 - 35.9 U/mL <36.0  EBV VCA IgG     0.0 - 17.9 U/mL >600.0 (H)  CMV Ab - IgG     0.00 - 0.59 U/mL >10.00 (H)  CMV IgM Ser EIA-aCnc     0.0 - 29.9 AU/mL <30.0  Parvovirus B19, IgM     0.0 - 0.8 index 0.2  Parvovirus B19, IgG     0.0 - 0.8 index 0.4  Vitamin B12     232 - 1,245 pg/mL 499  Copper     72 - 166 ug/dL 98   RADIOGRAPHIC STUDIES: I have personally reviewed the radiological images as listed and agreed with the findings in the report. No results found.  ASSESSMENT & PLAN:   61 year old African-American male with history of HIV on HAART therapy with  1) New leukopenia over the last couple of months. Now resolved on labs today with a WBC count of 5.2k with an ANC of 3.2k. Could have been from his back bronchial infection a few months ago. Has multiple etiologies that could cause this  including HIV, changes with hemodialysis, medications.  Currently no overt symptoms suggestive of lymphoma or active active infections.  2) Anemia - macrocytic. This is partly due to the HIV medications but anemia is predominantly driven by his chronic kidney disease and iron deficiency anemia related to dialysis. He is currently on IV Iron as  needed and ESA's per nephrology  3) Thrombocytopenia - intermittent now resolved.  Patient had recent labs with his Dr. Marylou Flesher on 10/27/2016 that showed leukopenia with a WBC count of 2000 and neutrophils of 42%, hemoglobin of 8.3 with an MCV of 101 and RDW of 17.8 and platelet counts of 126k. He also previously has had WBC counts of 2.3k on 09/22/2016 and 3k on 09/02/2017 with radiating degrees of anemia ranging from 6.9-8.8. And platelet counts of 126-225k.  PLAN -Patient notes that has recently labs with his nephrologist resolution of anemia with IV iron and ESA's -Labs at that showed resolution of his leukopenia/neutropenia . -Workup showed no evidence of parvovirus infection , negative for acute EBV or CMV infection .  -LDH was within normal limits and suggests against a lymphoproliferative disorder or hemolysis . -Myeloma panel showed no M spike. 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 -B12 and folate levels within normal limits. Copper is within normal limits.  -Given resolving blood counts no indication for additional hematology workup at this time . -Continue IV iron and ESA's with nephrology to manage anemia.  Continue follow-up with infectious disease, primary care physician and nephrology. Reconsult [name] if any new questions or concerns arise   All of the patients questions were answered with apparent satisfaction. The patient knows to call the clinic with any problems, questions or concerns.  I spent 15 minutes counseling the patient face to face. The total time spent in the appointment was 20 minutes and  more than 50% was on counseling and direct patient cares.    Sullivan Lone MD [place] AAHIVMS [facility] Hematology/Oncology Physician [facility]  (Office):       [phone] (Work cell):  [phone] (Fax):           [phone]  01/03/2017 4:59 PM

## 2017-01-04 DIAGNOSIS — E8779 Other fluid overload: Secondary | ICD-10-CM | POA: Diagnosis not present

## 2017-01-04 DIAGNOSIS — N186 End stage renal disease: Secondary | ICD-10-CM | POA: Diagnosis not present

## 2017-01-04 DIAGNOSIS — D509 Iron deficiency anemia, unspecified: Secondary | ICD-10-CM | POA: Diagnosis not present

## 2017-01-04 DIAGNOSIS — N2581 Secondary hyperparathyroidism of renal origin: Secondary | ICD-10-CM | POA: Diagnosis not present

## 2017-01-04 DIAGNOSIS — D631 Anemia in chronic kidney disease: Secondary | ICD-10-CM | POA: Diagnosis not present

## 2017-01-05 DIAGNOSIS — D509 Iron deficiency anemia, unspecified: Secondary | ICD-10-CM | POA: Diagnosis not present

## 2017-01-05 DIAGNOSIS — N2581 Secondary hyperparathyroidism of renal origin: Secondary | ICD-10-CM | POA: Diagnosis not present

## 2017-01-05 DIAGNOSIS — E8779 Other fluid overload: Secondary | ICD-10-CM | POA: Diagnosis not present

## 2017-01-05 DIAGNOSIS — N186 End stage renal disease: Secondary | ICD-10-CM | POA: Diagnosis not present

## 2017-01-05 DIAGNOSIS — D631 Anemia in chronic kidney disease: Secondary | ICD-10-CM | POA: Diagnosis not present

## 2017-01-06 DIAGNOSIS — D631 Anemia in chronic kidney disease: Secondary | ICD-10-CM | POA: Diagnosis not present

## 2017-01-06 DIAGNOSIS — E8779 Other fluid overload: Secondary | ICD-10-CM | POA: Diagnosis not present

## 2017-01-06 DIAGNOSIS — D509 Iron deficiency anemia, unspecified: Secondary | ICD-10-CM | POA: Diagnosis not present

## 2017-01-06 DIAGNOSIS — N2581 Secondary hyperparathyroidism of renal origin: Secondary | ICD-10-CM | POA: Diagnosis not present

## 2017-01-06 DIAGNOSIS — N186 End stage renal disease: Secondary | ICD-10-CM | POA: Diagnosis not present

## 2017-01-07 DIAGNOSIS — D509 Iron deficiency anemia, unspecified: Secondary | ICD-10-CM | POA: Diagnosis not present

## 2017-01-07 DIAGNOSIS — N2581 Secondary hyperparathyroidism of renal origin: Secondary | ICD-10-CM | POA: Diagnosis not present

## 2017-01-07 DIAGNOSIS — D631 Anemia in chronic kidney disease: Secondary | ICD-10-CM | POA: Diagnosis not present

## 2017-01-07 DIAGNOSIS — N186 End stage renal disease: Secondary | ICD-10-CM | POA: Diagnosis not present

## 2017-01-07 DIAGNOSIS — E8779 Other fluid overload: Secondary | ICD-10-CM | POA: Diagnosis not present

## 2017-01-08 DIAGNOSIS — D509 Iron deficiency anemia, unspecified: Secondary | ICD-10-CM | POA: Diagnosis not present

## 2017-01-08 DIAGNOSIS — N186 End stage renal disease: Secondary | ICD-10-CM | POA: Diagnosis not present

## 2017-01-08 DIAGNOSIS — N2581 Secondary hyperparathyroidism of renal origin: Secondary | ICD-10-CM | POA: Diagnosis not present

## 2017-01-08 DIAGNOSIS — E8779 Other fluid overload: Secondary | ICD-10-CM | POA: Diagnosis not present

## 2017-01-08 DIAGNOSIS — D631 Anemia in chronic kidney disease: Secondary | ICD-10-CM | POA: Diagnosis not present

## 2017-01-09 ENCOUNTER — Other Ambulatory Visit: Payer: Self-pay | Admitting: Internal Medicine

## 2017-01-11 DIAGNOSIS — N186 End stage renal disease: Secondary | ICD-10-CM | POA: Diagnosis not present

## 2017-01-11 DIAGNOSIS — N2581 Secondary hyperparathyroidism of renal origin: Secondary | ICD-10-CM | POA: Diagnosis not present

## 2017-01-11 DIAGNOSIS — D631 Anemia in chronic kidney disease: Secondary | ICD-10-CM | POA: Diagnosis not present

## 2017-01-11 DIAGNOSIS — D509 Iron deficiency anemia, unspecified: Secondary | ICD-10-CM | POA: Diagnosis not present

## 2017-01-11 DIAGNOSIS — E8779 Other fluid overload: Secondary | ICD-10-CM | POA: Diagnosis not present

## 2017-01-12 DIAGNOSIS — N2581 Secondary hyperparathyroidism of renal origin: Secondary | ICD-10-CM | POA: Diagnosis not present

## 2017-01-12 DIAGNOSIS — D509 Iron deficiency anemia, unspecified: Secondary | ICD-10-CM | POA: Diagnosis not present

## 2017-01-12 DIAGNOSIS — D631 Anemia in chronic kidney disease: Secondary | ICD-10-CM | POA: Diagnosis not present

## 2017-01-12 DIAGNOSIS — N186 End stage renal disease: Secondary | ICD-10-CM | POA: Diagnosis not present

## 2017-01-12 DIAGNOSIS — E8779 Other fluid overload: Secondary | ICD-10-CM | POA: Diagnosis not present

## 2017-01-13 DIAGNOSIS — N2581 Secondary hyperparathyroidism of renal origin: Secondary | ICD-10-CM | POA: Diagnosis not present

## 2017-01-13 DIAGNOSIS — E8779 Other fluid overload: Secondary | ICD-10-CM | POA: Diagnosis not present

## 2017-01-13 DIAGNOSIS — D631 Anemia in chronic kidney disease: Secondary | ICD-10-CM | POA: Diagnosis not present

## 2017-01-13 DIAGNOSIS — D509 Iron deficiency anemia, unspecified: Secondary | ICD-10-CM | POA: Diagnosis not present

## 2017-01-13 DIAGNOSIS — N186 End stage renal disease: Secondary | ICD-10-CM | POA: Diagnosis not present

## 2017-01-14 DIAGNOSIS — N2581 Secondary hyperparathyroidism of renal origin: Secondary | ICD-10-CM | POA: Diagnosis not present

## 2017-01-14 DIAGNOSIS — E8779 Other fluid overload: Secondary | ICD-10-CM | POA: Diagnosis not present

## 2017-01-14 DIAGNOSIS — D631 Anemia in chronic kidney disease: Secondary | ICD-10-CM | POA: Diagnosis not present

## 2017-01-14 DIAGNOSIS — N186 End stage renal disease: Secondary | ICD-10-CM | POA: Diagnosis not present

## 2017-01-14 DIAGNOSIS — D509 Iron deficiency anemia, unspecified: Secondary | ICD-10-CM | POA: Diagnosis not present

## 2017-01-15 DIAGNOSIS — E8779 Other fluid overload: Secondary | ICD-10-CM | POA: Diagnosis not present

## 2017-01-15 DIAGNOSIS — N2581 Secondary hyperparathyroidism of renal origin: Secondary | ICD-10-CM | POA: Diagnosis not present

## 2017-01-15 DIAGNOSIS — D509 Iron deficiency anemia, unspecified: Secondary | ICD-10-CM | POA: Diagnosis not present

## 2017-01-15 DIAGNOSIS — N186 End stage renal disease: Secondary | ICD-10-CM | POA: Diagnosis not present

## 2017-01-15 DIAGNOSIS — D631 Anemia in chronic kidney disease: Secondary | ICD-10-CM | POA: Diagnosis not present

## 2017-01-15 MED FILL — ETHYL CHLORIDE SPRAY: 25 days supply | Qty: 104 | Fill #1

## 2017-01-18 DIAGNOSIS — D631 Anemia in chronic kidney disease: Secondary | ICD-10-CM | POA: Diagnosis not present

## 2017-01-18 DIAGNOSIS — D509 Iron deficiency anemia, unspecified: Secondary | ICD-10-CM | POA: Diagnosis not present

## 2017-01-18 DIAGNOSIS — I120 Hypertensive chronic kidney disease with stage 5 chronic kidney disease or end stage renal disease: Secondary | ICD-10-CM | POA: Diagnosis not present

## 2017-01-18 DIAGNOSIS — N186 End stage renal disease: Secondary | ICD-10-CM | POA: Diagnosis not present

## 2017-01-18 DIAGNOSIS — E8779 Other fluid overload: Secondary | ICD-10-CM | POA: Diagnosis not present

## 2017-01-18 DIAGNOSIS — Z992 Dependence on renal dialysis: Secondary | ICD-10-CM | POA: Diagnosis not present

## 2017-01-18 DIAGNOSIS — N2581 Secondary hyperparathyroidism of renal origin: Secondary | ICD-10-CM | POA: Diagnosis not present

## 2017-01-19 DIAGNOSIS — N186 End stage renal disease: Secondary | ICD-10-CM | POA: Diagnosis not present

## 2017-01-19 DIAGNOSIS — D631 Anemia in chronic kidney disease: Secondary | ICD-10-CM | POA: Diagnosis not present

## 2017-01-19 DIAGNOSIS — N2581 Secondary hyperparathyroidism of renal origin: Secondary | ICD-10-CM | POA: Diagnosis not present

## 2017-01-19 DIAGNOSIS — D509 Iron deficiency anemia, unspecified: Secondary | ICD-10-CM | POA: Diagnosis not present

## 2017-01-19 DIAGNOSIS — E8779 Other fluid overload: Secondary | ICD-10-CM | POA: Diagnosis not present

## 2017-01-20 DIAGNOSIS — N2581 Secondary hyperparathyroidism of renal origin: Secondary | ICD-10-CM | POA: Diagnosis not present

## 2017-01-20 DIAGNOSIS — E8779 Other fluid overload: Secondary | ICD-10-CM | POA: Diagnosis not present

## 2017-01-20 DIAGNOSIS — D631 Anemia in chronic kidney disease: Secondary | ICD-10-CM | POA: Diagnosis not present

## 2017-01-20 DIAGNOSIS — N186 End stage renal disease: Secondary | ICD-10-CM | POA: Diagnosis not present

## 2017-01-20 DIAGNOSIS — D509 Iron deficiency anemia, unspecified: Secondary | ICD-10-CM | POA: Diagnosis not present

## 2017-01-21 DIAGNOSIS — E8779 Other fluid overload: Secondary | ICD-10-CM | POA: Diagnosis not present

## 2017-01-21 DIAGNOSIS — N2581 Secondary hyperparathyroidism of renal origin: Secondary | ICD-10-CM | POA: Diagnosis not present

## 2017-01-21 DIAGNOSIS — D509 Iron deficiency anemia, unspecified: Secondary | ICD-10-CM | POA: Diagnosis not present

## 2017-01-21 DIAGNOSIS — D631 Anemia in chronic kidney disease: Secondary | ICD-10-CM | POA: Diagnosis not present

## 2017-01-21 DIAGNOSIS — N186 End stage renal disease: Secondary | ICD-10-CM | POA: Diagnosis not present

## 2017-01-22 DIAGNOSIS — D509 Iron deficiency anemia, unspecified: Secondary | ICD-10-CM | POA: Diagnosis not present

## 2017-01-22 DIAGNOSIS — E8779 Other fluid overload: Secondary | ICD-10-CM | POA: Diagnosis not present

## 2017-01-22 DIAGNOSIS — N2581 Secondary hyperparathyroidism of renal origin: Secondary | ICD-10-CM | POA: Diagnosis not present

## 2017-01-22 DIAGNOSIS — D631 Anemia in chronic kidney disease: Secondary | ICD-10-CM | POA: Diagnosis not present

## 2017-01-22 DIAGNOSIS — N186 End stage renal disease: Secondary | ICD-10-CM | POA: Diagnosis not present

## 2017-01-25 DIAGNOSIS — N2581 Secondary hyperparathyroidism of renal origin: Secondary | ICD-10-CM | POA: Diagnosis not present

## 2017-01-25 DIAGNOSIS — D631 Anemia in chronic kidney disease: Secondary | ICD-10-CM | POA: Diagnosis not present

## 2017-01-25 DIAGNOSIS — N186 End stage renal disease: Secondary | ICD-10-CM | POA: Diagnosis not present

## 2017-01-25 DIAGNOSIS — D509 Iron deficiency anemia, unspecified: Secondary | ICD-10-CM | POA: Diagnosis not present

## 2017-01-25 DIAGNOSIS — E8779 Other fluid overload: Secondary | ICD-10-CM | POA: Diagnosis not present

## 2017-01-26 DIAGNOSIS — D509 Iron deficiency anemia, unspecified: Secondary | ICD-10-CM | POA: Diagnosis not present

## 2017-01-26 DIAGNOSIS — M109 Gout, unspecified: Secondary | ICD-10-CM | POA: Diagnosis not present

## 2017-01-26 DIAGNOSIS — N186 End stage renal disease: Secondary | ICD-10-CM | POA: Diagnosis not present

## 2017-01-26 DIAGNOSIS — E8779 Other fluid overload: Secondary | ICD-10-CM | POA: Diagnosis not present

## 2017-01-26 DIAGNOSIS — D631 Anemia in chronic kidney disease: Secondary | ICD-10-CM | POA: Diagnosis not present

## 2017-01-26 DIAGNOSIS — E785 Hyperlipidemia, unspecified: Secondary | ICD-10-CM | POA: Diagnosis not present

## 2017-01-26 DIAGNOSIS — N2581 Secondary hyperparathyroidism of renal origin: Secondary | ICD-10-CM | POA: Diagnosis not present

## 2017-01-27 DIAGNOSIS — N186 End stage renal disease: Secondary | ICD-10-CM | POA: Diagnosis not present

## 2017-01-27 DIAGNOSIS — N2581 Secondary hyperparathyroidism of renal origin: Secondary | ICD-10-CM | POA: Diagnosis not present

## 2017-01-27 DIAGNOSIS — D509 Iron deficiency anemia, unspecified: Secondary | ICD-10-CM | POA: Diagnosis not present

## 2017-01-27 DIAGNOSIS — D631 Anemia in chronic kidney disease: Secondary | ICD-10-CM | POA: Diagnosis not present

## 2017-01-27 DIAGNOSIS — E8779 Other fluid overload: Secondary | ICD-10-CM | POA: Diagnosis not present

## 2017-01-28 DIAGNOSIS — E8779 Other fluid overload: Secondary | ICD-10-CM | POA: Diagnosis not present

## 2017-01-28 DIAGNOSIS — D509 Iron deficiency anemia, unspecified: Secondary | ICD-10-CM | POA: Diagnosis not present

## 2017-01-28 DIAGNOSIS — N2581 Secondary hyperparathyroidism of renal origin: Secondary | ICD-10-CM | POA: Diagnosis not present

## 2017-01-28 DIAGNOSIS — D631 Anemia in chronic kidney disease: Secondary | ICD-10-CM | POA: Diagnosis not present

## 2017-01-28 DIAGNOSIS — N186 End stage renal disease: Secondary | ICD-10-CM | POA: Diagnosis not present

## 2017-01-29 DIAGNOSIS — N186 End stage renal disease: Secondary | ICD-10-CM | POA: Diagnosis not present

## 2017-01-29 DIAGNOSIS — E8779 Other fluid overload: Secondary | ICD-10-CM | POA: Diagnosis not present

## 2017-01-29 DIAGNOSIS — N2581 Secondary hyperparathyroidism of renal origin: Secondary | ICD-10-CM | POA: Diagnosis not present

## 2017-01-29 DIAGNOSIS — D631 Anemia in chronic kidney disease: Secondary | ICD-10-CM | POA: Diagnosis not present

## 2017-01-29 DIAGNOSIS — D509 Iron deficiency anemia, unspecified: Secondary | ICD-10-CM | POA: Diagnosis not present

## 2017-02-01 DIAGNOSIS — D631 Anemia in chronic kidney disease: Secondary | ICD-10-CM | POA: Diagnosis not present

## 2017-02-01 DIAGNOSIS — D509 Iron deficiency anemia, unspecified: Secondary | ICD-10-CM | POA: Diagnosis not present

## 2017-02-01 DIAGNOSIS — E8779 Other fluid overload: Secondary | ICD-10-CM | POA: Diagnosis not present

## 2017-02-01 DIAGNOSIS — N2581 Secondary hyperparathyroidism of renal origin: Secondary | ICD-10-CM | POA: Diagnosis not present

## 2017-02-01 DIAGNOSIS — N186 End stage renal disease: Secondary | ICD-10-CM | POA: Diagnosis not present

## 2017-02-02 DIAGNOSIS — D631 Anemia in chronic kidney disease: Secondary | ICD-10-CM | POA: Diagnosis not present

## 2017-02-02 DIAGNOSIS — E8779 Other fluid overload: Secondary | ICD-10-CM | POA: Diagnosis not present

## 2017-02-02 DIAGNOSIS — N2581 Secondary hyperparathyroidism of renal origin: Secondary | ICD-10-CM | POA: Diagnosis not present

## 2017-02-02 DIAGNOSIS — D509 Iron deficiency anemia, unspecified: Secondary | ICD-10-CM | POA: Diagnosis not present

## 2017-02-02 DIAGNOSIS — N186 End stage renal disease: Secondary | ICD-10-CM | POA: Diagnosis not present

## 2017-02-03 ENCOUNTER — Telehealth: Payer: Self-pay

## 2017-02-03 DIAGNOSIS — Z01810 Encounter for preprocedural cardiovascular examination: Secondary | ICD-10-CM

## 2017-02-03 DIAGNOSIS — D509 Iron deficiency anemia, unspecified: Secondary | ICD-10-CM | POA: Diagnosis not present

## 2017-02-03 DIAGNOSIS — E8779 Other fluid overload: Secondary | ICD-10-CM | POA: Diagnosis not present

## 2017-02-03 DIAGNOSIS — N186 End stage renal disease: Secondary | ICD-10-CM | POA: Diagnosis not present

## 2017-02-03 DIAGNOSIS — N2581 Secondary hyperparathyroidism of renal origin: Secondary | ICD-10-CM | POA: Diagnosis not present

## 2017-02-03 DIAGNOSIS — D631 Anemia in chronic kidney disease: Secondary | ICD-10-CM | POA: Diagnosis not present

## 2017-02-03 DIAGNOSIS — I351 Nonrheumatic aortic (valve) insufficiency: Secondary | ICD-10-CM

## 2017-02-03 NOTE — Telephone Encounter (Addendum)
**Note De-Identified  Obfuscation** Cath has be scheduled for Monday 02/08/17 with Dr Irish Lack at 10:30 (pt to arrive at 8:30).  Labs are scheduled to be drawn on Friday 02/05/17.  I have sent a message to Dr Irish Lack and billing.  The pts cath instructions letter is ready in his chart.  I have been unable to reach the pt or his brother to discuss details of the cath but I have left a message with both of them to call this office.  Message forwarded to Timmie Foerster and Janan Halter to f/u with the pt.

## 2017-02-03 NOTE — Telephone Encounter (Addendum)
From: Laury Deep, RN  Sent: 01/29/2017  4:52 PM  To: Stanton Kidney, RN, Dionicio Stall, RN   Kelly/Sherri,   I am not sure if either of you are the right people for this but I wasn't sure who to ask to get this patient of Dr. Tanna Furry set up for a cardiac cath.   Dr. Roxan Hockey saw him back in February severe AI. He just called back ready to schedule srgy but he needs a cath first.   Can one of you all set that up?   Thanks,  Thurmond Butts       I left a message on the pts brothers VM asking him to call me back.

## 2017-02-03 NOTE — Telephone Encounter (Signed)
I left a message on the pts VM asking him to or have his brother call us back to discuss the pts cath.

## 2017-02-03 NOTE — Telephone Encounter (Signed)
**Note De-Identified  Obfuscation** I have called and left another message on the pts brother, [name], [place] asking him to call us back concerning scheduling his cath.

## 2017-02-04 ENCOUNTER — Telehealth: Payer: Self-pay | Admitting: Interventional Cardiology

## 2017-02-04 DIAGNOSIS — N2581 Secondary hyperparathyroidism of renal origin: Secondary | ICD-10-CM | POA: Diagnosis not present

## 2017-02-04 DIAGNOSIS — D509 Iron deficiency anemia, unspecified: Secondary | ICD-10-CM | POA: Diagnosis not present

## 2017-02-04 DIAGNOSIS — D631 Anemia in chronic kidney disease: Secondary | ICD-10-CM | POA: Diagnosis not present

## 2017-02-04 DIAGNOSIS — E8779 Other fluid overload: Secondary | ICD-10-CM | POA: Diagnosis not present

## 2017-02-04 DIAGNOSIS — N186 End stage renal disease: Secondary | ICD-10-CM | POA: Diagnosis not present

## 2017-02-04 NOTE — Telephone Encounter (Signed)
New message    Pt brother is calling about pt appts and procedure. Asking for a call back.

## 2017-02-04 NOTE — Telephone Encounter (Signed)
Called and spoke to brother [name] (DPR on file). Made him aware that the patient is scheduled for a right and left heart cath on 5/21 and that the patient should arrive at 8:30 AM. Reviewed cath instructions with the brother. Made brother aware that the patient needs to come to our office tomorrow for labs and that the  Cath instructions letter will be left up front for him to pick up. Brother verbalized understanding, stated the patient will be there for lab appointment and procedure, and thanked me for the call.

## 2017-02-05 ENCOUNTER — Other Ambulatory Visit: Payer: Medicare Other

## 2017-02-05 DIAGNOSIS — D631 Anemia in chronic kidney disease: Secondary | ICD-10-CM | POA: Diagnosis not present

## 2017-02-05 DIAGNOSIS — N2581 Secondary hyperparathyroidism of renal origin: Secondary | ICD-10-CM | POA: Diagnosis not present

## 2017-02-05 DIAGNOSIS — E8779 Other fluid overload: Secondary | ICD-10-CM | POA: Diagnosis not present

## 2017-02-05 DIAGNOSIS — N186 End stage renal disease: Secondary | ICD-10-CM | POA: Diagnosis not present

## 2017-02-05 DIAGNOSIS — D509 Iron deficiency anemia, unspecified: Secondary | ICD-10-CM | POA: Diagnosis not present

## 2017-02-05 NOTE — Telephone Encounter (Signed)
Spoke with [name], pts brother, and informed him cath rescheduled to 5/30.  Pt will come by office on 5/24 for pre procedure labs. Informed instructions for cath remain the same except to arrive at [facility] hospital at 6:30 a.m. for an 8:30 a.m.procedure. Brother verbalized understanding and agreeable to plan.  He thanks me for helping.

## 2017-02-05 NOTE — Telephone Encounter (Signed)
Message  Received: Today  Message Contents  Laury Deep, RN  Stanton Kidney, RN        Hey,   Patient's brother called stating they have a family emergency and need to move the cath to anytime after next Tuesday (5/22). I told him I would let you know. I have moved Dr. Leonarda Salon appt to 6/5 per patient request so the cath can be any time b/t Tuesday and end of the month.   Can you re-schedule and call patients brother at [phone]?   Thanks so much,  [employer]

## 2017-02-08 DIAGNOSIS — E8779 Other fluid overload: Secondary | ICD-10-CM | POA: Diagnosis not present

## 2017-02-08 DIAGNOSIS — N2581 Secondary hyperparathyroidism of renal origin: Secondary | ICD-10-CM | POA: Diagnosis not present

## 2017-02-08 DIAGNOSIS — N186 End stage renal disease: Secondary | ICD-10-CM | POA: Diagnosis not present

## 2017-02-08 DIAGNOSIS — D631 Anemia in chronic kidney disease: Secondary | ICD-10-CM | POA: Diagnosis not present

## 2017-02-08 DIAGNOSIS — D509 Iron deficiency anemia, unspecified: Secondary | ICD-10-CM | POA: Diagnosis not present

## 2017-02-09 DIAGNOSIS — D631 Anemia in chronic kidney disease: Secondary | ICD-10-CM | POA: Diagnosis not present

## 2017-02-09 DIAGNOSIS — N186 End stage renal disease: Secondary | ICD-10-CM | POA: Diagnosis not present

## 2017-02-09 DIAGNOSIS — N2581 Secondary hyperparathyroidism of renal origin: Secondary | ICD-10-CM | POA: Diagnosis not present

## 2017-02-09 DIAGNOSIS — E8779 Other fluid overload: Secondary | ICD-10-CM | POA: Diagnosis not present

## 2017-02-09 DIAGNOSIS — D509 Iron deficiency anemia, unspecified: Secondary | ICD-10-CM | POA: Diagnosis not present

## 2017-02-10 ENCOUNTER — Telehealth: Payer: Self-pay | Admitting: Interventional Cardiology

## 2017-02-10 ENCOUNTER — Encounter: Payer: Self-pay | Admitting: Infectious Diseases

## 2017-02-10 ENCOUNTER — Ambulatory Visit (INDEPENDENT_AMBULATORY_CARE_PROVIDER_SITE_OTHER): Payer: Medicare Other | Admitting: Infectious Diseases

## 2017-02-10 VITALS — BP 116/72 | HR 73 | Temp 98.0°F | Ht 71.0 in | Wt 178.0 lb

## 2017-02-10 DIAGNOSIS — B181 Chronic viral hepatitis B without delta-agent: Secondary | ICD-10-CM | POA: Diagnosis not present

## 2017-02-10 DIAGNOSIS — N2581 Secondary hyperparathyroidism of renal origin: Secondary | ICD-10-CM | POA: Diagnosis not present

## 2017-02-10 DIAGNOSIS — I351 Nonrheumatic aortic (valve) insufficiency: Secondary | ICD-10-CM | POA: Diagnosis not present

## 2017-02-10 DIAGNOSIS — Z79899 Other long term (current) drug therapy: Secondary | ICD-10-CM | POA: Diagnosis not present

## 2017-02-10 DIAGNOSIS — B20 Human immunodeficiency virus [HIV] disease: Secondary | ICD-10-CM

## 2017-02-10 DIAGNOSIS — Z113 Encounter for screening for infections with a predominantly sexual mode of transmission: Secondary | ICD-10-CM | POA: Diagnosis not present

## 2017-02-10 DIAGNOSIS — N186 End stage renal disease: Secondary | ICD-10-CM

## 2017-02-10 DIAGNOSIS — E8779 Other fluid overload: Secondary | ICD-10-CM | POA: Diagnosis not present

## 2017-02-10 DIAGNOSIS — D631 Anemia in chronic kidney disease: Secondary | ICD-10-CM | POA: Diagnosis not present

## 2017-02-10 DIAGNOSIS — D509 Iron deficiency anemia, unspecified: Secondary | ICD-10-CM | POA: Diagnosis not present

## 2017-02-10 LAB — COMPLETE METABOLIC PANEL WITH GFR
ALT: 6 U/L — ABNORMAL LOW (ref 9–46)
AST: 10 U/L (ref 10–35)
Albumin: 4 g/dL (ref 3.6–5.1)
Alkaline Phosphatase: 63 U/L (ref 40–115)
BUN: 89 mg/dL — ABNORMAL HIGH (ref 7–25)
CO2: 18 mmol/L — AB (ref 20–31)
Calcium: 9.3 mg/dL (ref 8.6–10.3)
Chloride: 103 mmol/L (ref 98–110)
Creat: 13.81 mg/dL — ABNORMAL HIGH (ref 0.70–1.25)
GFR, EST NON AFRICAN AMERICAN: 3 mL/min — AB (ref >=60)
GFR, Est African American: <4 mL/min — ABNORMAL LOW (ref >=60)
GLUCOSE: 96 mg/dL (ref 65–99)
Potassium: 4.7 mmol/L (ref 3.5–5.3)
SODIUM: 137 mmol/L (ref 135–146)
TOTAL PROTEIN: 7.2 g/dL (ref 6.1–8.1)
Total Bilirubin: 0.6 mg/dL (ref 0.2–1.2)

## 2017-02-10 LAB — CBC
HEMATOCRIT: 38.6 % (ref 38.5–50.0)
HEMOGLOBIN: 12.6 g/dL — AB (ref 13.2–17.1)
MCH: 28.5 pg (ref 27.0–33.0)
MCHC: 32.6 g/dL (ref 32.0–36.0)
MCV: 87.3 fL (ref 80.0–100.0)
MPV: 9.3 fL (ref 7.5–12.5)
Platelets: 108 10*3/uL — ABNORMAL LOW (ref 140–400)
RBC: 4.42 MIL/uL (ref 4.20–5.80)
RDW: 15.9 % — AB (ref 11.0–15.0)
WBC: 5.7 10*3/uL (ref 3.8–10.8)

## 2017-02-10 LAB — LIPID PANEL
CHOL/HDL RATIO: 2.3 ratio (ref <5.0)
Cholesterol: 77 mg/dL (ref <200)
HDL: 34 mg/dL — AB (ref >40)
LDL CALC: 38 mg/dL (ref <100)
Triglycerides: 27 mg/dL (ref <150)
VLDL: 5 mg/dL (ref <30)

## 2017-02-10 NOTE — Assessment & Plan Note (Signed)
My great appreciation to [facility] and [facility].  For cath next week.

## 2017-02-10 NOTE — Telephone Encounter (Signed)
It looks like the patient was suppose to have pre-cath labs (BMET, CBC, PT/INR) tomorrow in our office. Spoke with Dr. Johnnye Sima and he states that he has drawn those labs and they will be available in [place].

## 2017-02-10 NOTE — Telephone Encounter (Signed)
New message    Dr. Johnnye Sima called about pt. He is at his office and Dr. Johnnye Sima stated he would draw labs on the pt that he is supposed to get drawn here tomorrow. He said if there is any questions to call

## 2017-02-10 NOTE — Assessment & Plan Note (Signed)
Appears to be doing well Appreciate f/u of renal.  Will cc Dr Joelyn Oms.

## 2017-02-10 NOTE — Progress Notes (Signed)
   Subjective:    Patient ID: [name], male    DOB: Jan 14, 1956, 61 y.o.   MRN: [id number]  HPI 60yo M with HIV+, Hepatitis B, ESRD, HTN. On 3TC/ISN/TFV. He was hospitalized 07-2016 with MSSE bacteremia. He then returned to [facility] on 12-20 to 09-13-16 with worsening SOB and back pain. He had TEE (-) fo vegetations, + AI. His MRI lumbar spine did not show infection.  His repeat BCx were (-). He was d/c home off anbx.  He has had f/u with CVTS for possible AVR due to severe AI. He has class II CHF.   Wt down 8#, he attributes this to his infection/bacteremia.  He is to have cath 5-30 for screening for his AVR. Very nervous about this.   Does home HD.    HIV 1 RNA Quant (copies/mL)  Date Value  09/09/2016 <20  06/10/2016 <20  04/07/2016 <20   CD4 T Cell Abs (/uL)  Date Value  09/09/2016 250 (L)  08/07/2016 160 (L)  06/10/2016 360 (L)    Review of Systems  Constitutional: Negative for appetite change and unexpected weight change.  Respiratory: Negative for cough and shortness of breath.   Cardiovascular: Negative for chest pain and leg swelling.  Gastrointestinal: Negative for constipation and diarrhea.  Neurological: Negative for dizziness and headaches.  anuric DOE. No problems with fistula.      Objective:   Physical Exam  Constitutional: He appears well-developed and well-nourished.  HENT:  Mouth/Throat: No oropharyngeal exudate.  Eyes: EOM are normal. Pupils are equal, round, and reactive to light.  Neck: Neck supple.  Cardiovascular: Normal rate, regular rhythm and normal heart sounds.   Pulmonary/Chest: Effort normal and breath sounds normal.  Abdominal: Soft. Bowel sounds are normal. There is no tenderness. There is no rebound.  Musculoskeletal: He exhibits no edema.       Arms: Lymphadenopathy:    He has no cervical adenopathy.  Psychiatric: He has a normal mood and affect.      Assessment & Plan:

## 2017-02-10 NOTE — Assessment & Plan Note (Signed)
He is doing well  Some concern about wt loss Offered/refused condoms.   No change in meds.  Will recheck his numbers.  Will see him back in 6 months.

## 2017-02-10 NOTE — Assessment & Plan Note (Signed)
Will check his HBV DNA LFTs stable.

## 2017-02-11 ENCOUNTER — Other Ambulatory Visit: Payer: Medicare Other

## 2017-02-11 DIAGNOSIS — D631 Anemia in chronic kidney disease: Secondary | ICD-10-CM | POA: Diagnosis not present

## 2017-02-11 DIAGNOSIS — N186 End stage renal disease: Secondary | ICD-10-CM | POA: Diagnosis not present

## 2017-02-11 DIAGNOSIS — N2581 Secondary hyperparathyroidism of renal origin: Secondary | ICD-10-CM | POA: Diagnosis not present

## 2017-02-11 DIAGNOSIS — E8779 Other fluid overload: Secondary | ICD-10-CM | POA: Diagnosis not present

## 2017-02-11 DIAGNOSIS — D509 Iron deficiency anemia, unspecified: Secondary | ICD-10-CM | POA: Diagnosis not present

## 2017-02-11 LAB — PROTIME-INR
INR: 1.1
PROTHROMBIN TIME: 11.3 s (ref 9.0–11.5)

## 2017-02-11 LAB — RPR

## 2017-02-11 LAB — T-HELPER CELL (CD4) - (RCID CLINIC ONLY)
CD4 % Helper T Cell: 31 % — ABNORMAL LOW (ref 33–55)
CD4 T CELL ABS: 460 /uL (ref 400–2700)

## 2017-02-12 ENCOUNTER — Telehealth: Payer: Self-pay

## 2017-02-12 DIAGNOSIS — N186 End stage renal disease: Secondary | ICD-10-CM | POA: Diagnosis not present

## 2017-02-12 DIAGNOSIS — D631 Anemia in chronic kidney disease: Secondary | ICD-10-CM | POA: Diagnosis not present

## 2017-02-12 DIAGNOSIS — N2581 Secondary hyperparathyroidism of renal origin: Secondary | ICD-10-CM | POA: Diagnosis not present

## 2017-02-12 DIAGNOSIS — E8779 Other fluid overload: Secondary | ICD-10-CM | POA: Diagnosis not present

## 2017-02-12 DIAGNOSIS — D509 Iron deficiency anemia, unspecified: Secondary | ICD-10-CM | POA: Diagnosis not present

## 2017-02-12 NOTE — Telephone Encounter (Signed)
Per review of Pt chart, Pt has not been seen by cardiologist in last 30 days.  Pt with appointment for cardiac catheterization on 02/17/2017 with Dr. Burt Knack.  Per review of Pt medical comorbidities, Pt should be seen by PA prior to procedure.  Scheduled Pt for appt with PA for 02/16/2017 @ 3:30 pm.  Call placed to Pt, call went to VM.  Left VM indicating Pt scheduled for appt 02/16/2017 and requested call back to confirm.  Left this nurse name and number for call back.  Will attempt outreach this afternoon if no call back received.

## 2017-02-12 NOTE — Telephone Encounter (Signed)
Call received from Pt, identified with name and DOB.  Notified Pt of appt on Tuesday prior to cath to make sure Pt is stable for heart catheterization.  Pt indicates understanding, will come to appt Tuesday.  Pt requested information on procedure, gave Pt brief description of process.  Pt asked if he could take anti-anxiety medication prior to procedure.  Notified Pt that that would be fine to take prior to procedure.   Will continue to monitor for further educational needs.

## 2017-02-15 DIAGNOSIS — E8779 Other fluid overload: Secondary | ICD-10-CM | POA: Diagnosis not present

## 2017-02-15 DIAGNOSIS — D631 Anemia in chronic kidney disease: Secondary | ICD-10-CM | POA: Diagnosis not present

## 2017-02-15 DIAGNOSIS — D509 Iron deficiency anemia, unspecified: Secondary | ICD-10-CM | POA: Diagnosis not present

## 2017-02-15 DIAGNOSIS — N2581 Secondary hyperparathyroidism of renal origin: Secondary | ICD-10-CM | POA: Diagnosis not present

## 2017-02-15 DIAGNOSIS — N186 End stage renal disease: Secondary | ICD-10-CM | POA: Diagnosis not present

## 2017-02-16 ENCOUNTER — Encounter: Payer: Self-pay | Admitting: Physician Assistant

## 2017-02-16 ENCOUNTER — Ambulatory Visit (INDEPENDENT_AMBULATORY_CARE_PROVIDER_SITE_OTHER): Payer: Medicare Other | Admitting: Physician Assistant

## 2017-02-16 VITALS — BP 118/62 | HR 75 | Ht 72.0 in | Wt 179.8 lb

## 2017-02-16 DIAGNOSIS — Z992 Dependence on renal dialysis: Secondary | ICD-10-CM

## 2017-02-16 DIAGNOSIS — R0609 Other forms of dyspnea: Secondary | ICD-10-CM

## 2017-02-16 DIAGNOSIS — D509 Iron deficiency anemia, unspecified: Secondary | ICD-10-CM | POA: Diagnosis not present

## 2017-02-16 DIAGNOSIS — I351 Nonrheumatic aortic (valve) insufficiency: Secondary | ICD-10-CM | POA: Diagnosis not present

## 2017-02-16 DIAGNOSIS — D631 Anemia in chronic kidney disease: Secondary | ICD-10-CM | POA: Diagnosis not present

## 2017-02-16 DIAGNOSIS — I10 Essential (primary) hypertension: Secondary | ICD-10-CM | POA: Diagnosis not present

## 2017-02-16 DIAGNOSIS — E8779 Other fluid overload: Secondary | ICD-10-CM | POA: Diagnosis not present

## 2017-02-16 DIAGNOSIS — N186 End stage renal disease: Secondary | ICD-10-CM

## 2017-02-16 DIAGNOSIS — N2581 Secondary hyperparathyroidism of renal origin: Secondary | ICD-10-CM | POA: Diagnosis not present

## 2017-02-16 NOTE — Progress Notes (Signed)
Cardiology Office Note    Date:  02/16/2017   ID:  [name], DOB [birth year], MRN [id number]  PCP:  Velna Ochs, MD  Cardiologist:  Likely Dr. Burt Knack Who will do cath tomorrow (Previously seen by Dr. Lovena Le)  Chief Complaint: discuss R & L cath   History of Present Illness:   [name] is a 61 y.o. male with hx of HIV, ESRD on home HD (4 times/week), HTN, chronic anemia and aortic insufficiency presented to discussed R & L cath.   Hx of MSSAbacteremia 07/2016 from an unclear source in November associated with a worsening of his aortic insufficiency from mild to moderate over a period of few months. TEE revealed severe aortic regurgitation, no endocarditis.   Echo 04/09/16   EF 60-65%  Moderate LVH G1DD  AV mild AR  LA moderately dilated Echo 07/2016   EF 60-65% moderate LVH G2DD  AV with moderate Aortic regurgitation  LA severely dilated TEE  08/2016  EF 60-65%  Severe Aortic regurgitation , no thrombus in atrium no vegetation.   Seen by APP and Dr. Lovena Le 10/06/16 for worsening AI and DOE. Recommended R & L cath for further work up. However, he defer until recently.   Today he states that his DOE is stable. The patient denies nausea, vomiting, fever, chest pain, palpitations, orthopnea, PND, dizziness, syncope, cough, congestion, abdominal pain, hematochezia, melena, lower extremity edema. Pre-cath labs done by Dr. Johnnye Sima 02/10/17.    Past Medical History:  Diagnosis Date  . Anemia   . Anxiety   . Arthritis   . Asthma    per pt hx  . End stage renal disease on home HD 07/10/2011   Started HD in September 2012 at [name] with a tunneled HD catheter, now on home HD with NxtStage. Dialyzing through AVF L lower arm with buttonhole technique as of mid 2014. His brother does the HD treatments at home.  They are roommates for 23 years.  The brother works 3rd shift and gets off about 8am and then puts [name] on HD in the morning after getting home. Most of the time he does HD  about 4 times a week, for about 4 hours per treatment. Cause of ESRD was HTN according to patient. He says he let his health go and ending up with complications, and that he didn't like seeing doctors in those days.  He says he was diagnosed with severe HTN when he lived in [place] in his 20's.   . Gout   . Hepatitis B carrier ([name])   . HIV infection ([name])   . Hypertension   . Hyperthyroidism   . Pneumonia   . Seizure ([name])   . Seizures ([name]) 06/02/2011   pt denies this  . Thrombocytopenia ([name])     Past Surgical History:  Procedure Laterality Date  . AV FISTULA PLACEMENT  06/02/11   Left radiocephalic AVF  . COLONOSCOPY    . OTHER SURGICAL HISTORY     removal temporary HD catheter   . REVISON OF ARTERIOVENOUS FISTULA Left 10/10/2015   Procedure: REVISON OF LEFT RADIOCEPHALIC ARTERIOVENOUS FISTULA;  Surgeon: Angelia Mould, MD;  Location: [facility];  Service: Vascular;  Laterality: Left;  . REVISON OF ARTERIOVENOUS FISTULA Left 02/07/2016   Procedure: REPAIR OF PSEUDO-ANEUREYSM OF LEFT ARM  ARTERIOVENOUS FISTULA;  Surgeon: Angelia Mould, MD;  Location: [facility];  Service: Vascular;  Laterality: Left;  . REVISON OF ARTERIOVENOUS FISTULA Left 04/10/2016   Procedure:  PLICATION OF LEFT ARM RADIOCEPHALIC ARTERIOVENOUS FISTULA PSEUDOANEURYSM;  Surgeon: Angelia Mould, MD;  Location: [facility];  Service: Vascular;  Laterality: Left;  . TEE WITHOUT CARDIOVERSION N/A 09/11/2016   Procedure: TRANSESOPHAGEAL ECHOCARDIOGRAM (TEE);  Surgeon: Dorothy Spark, MD;  Location: [name] END[facility];  Service: Cardiovascular;  Laterality: N/A;    Current Medications: Prior to Admission medications   Medication Sig Start Date End Date Taking? Authorizing Provider  albuterol (PROVENTIL) (2.5 MG/3ML) 0.083% nebulizer solution Take 2.5 mg by nebulization every 6 (six) hours as needed for wheezing or shortness of breath.   Yes [provider]  ALPRAZolam (XANAX) 0.25 MG tablet Take 0.25 mg by  mouth daily as needed for anxiety.   Yes [provider]  amLODipine (NORVASC) 10 MG tablet Take 5 mg by mouth 2 (two) times daily.    Yes [provider]  calcium acetate (PHOSLO) 667 MG capsule Take 667-2,001 mg by mouth See admin instructions. 1,334-2,001 mg three times a day with each meal and 667-1,334 mg with each snack 06/06/15  Yes [provider]  carvedilol (COREG) 25 MG tablet Take 25 mg by mouth 2 (two) times daily with a meal.   Yes [provider]  cloNIDine (CATAPRES - DOSED IN MG/24 HR) 0.1 mg/24hr patch Place 1 patch (0.1 mg total) onto the skin every 7 (seven) days. 12/10/16 12/10/17 Yes Velna Ochs, MD  COMBIVENT RESPIMAT 20-100 MCG/ACT AERS respimat INHALE 1 PUFF BY MOUTH EVERY 4 HOURS AS NEEDED FOR WHEEZING OR SHORTNESS OF BREATH 06/09/16  Yes Ophelia Shoulder, MD  doxazosin (CARDURA) 2 MG tablet Take 4 mg by mouth daily. 11/10/16  Yes [provider]  EPIVIR HBV 5 MG/ML solution TAKE 5 ML BY MOUTH EVERY DAY 11/23/16  Yes Campbell Riches, MD  ethyl chloride spray Apply 1 application topically daily as needed. At HD 05/20/15  Yes [provider]  hydrALAZINE (APRESOLINE) 100 MG tablet Take 100 mg by mouth 3 (three) times daily.  08/02/16  Yes [provider]  ISENTRESS 400 MG tablet TAKE 1 TABLET(400 MG) BY MOUTH TWICE DAILY 08/03/16  Yes Campbell Riches, MD  multivitamin (RENA-VIT) TABS tablet Take 1 tablet by mouth daily.     Yes [provider]  tenofovir (VIREAD) 300 MG tablet TAKE 1 TABLET BY MOUTH EVERY SATURDAY AFTER DIALYSIS 09/22/16  Yes Campbell Riches, MD  traZODone (DESYREL) 50 MG tablet Take 50 mg by mouth at bedtime as needed for sleep.   Yes [provider]    Allergies:   Lisinopril and Penicillins   Social History   Social History  . Marital status: Single    Spouse name: N/A  . Number of children: N/A  . Years of education: N/A   Social History Main Topics  . Smoking  status: Former Smoker    Types: Cigarettes    Quit date: 08/08/2015  . Smokeless tobacco: Never Used  . Alcohol use No  . Drug use: No     Comment: uds (+) cocaine in 08/2011 but pt states was taking sudafed at the time  . Sexual activity: Not Currently    Partners: Male   Other Topics Concern  . 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   ROS:   Please see the history of present illness.    ROS All other systems reviewed and are negative.   PHYSICAL EXAM:   VS:  BP 118/62   Pulse 75   Ht 6' (1.829 m)   Wt 179 lb 12.8 oz (81.6 kg)   BMI 24.39 kg/m    GEN: Well nourished, well developed, in no acute distress  HEENT: normal  Neck: no JVD, carotid bruits, or masses Cardiac: RRR; no murmurs, rubs, or gallops,no edema  Respiratory:  clear to auscultation bilaterally, normal work of breathing GI: soft, nontender, nondistended, + BS MS: no deformity or atrophy  Skin: warm and dry, no rash Neuro:  Alert and Oriented x 3, Strength and sensation are intact Psych: euthymic mood, full affect  Wt Readings from Last 3 Encounters:  02/16/17 179 lb 12.8 oz (81.6 kg)  02/10/17 178 lb (80.7 kg)  12/31/16 178 lb 8 oz (81 kg)      Studies/Labs Reviewed:   EKG:  EKG is ordered today.    Recent Labs: 08/06/2016: Magnesium 1.9 09/09/2016: B Natriuretic Peptide 1,040.1 02/10/2017: ALT 6; BUN 89; Creat 13.81; Hemoglobin 12.6; Platelets 108; Potassium 4.7; Sodium 137   Lipid Panel    Component Value Date/Time   CHOL 77 02/10/2017 1619   TRIG 27 02/10/2017 1619   HDL 34 (L) 02/10/2017 1619   CHOLHDL 2.3 02/10/2017 1619   VLDL 5 02/10/2017 1619   LDLCALC 38 02/10/2017 1619    Additional  studies/ records that were reviewed today include:   As above  ASSESSMENT & PLAN:     1. Severe aortic regurgitation - Stable DOE. No edema, dizziness or syncope. R & L cath for further work up as schedule tomorrow.   The patient understands that risks include but are not limited to stroke (1 in 1000), death (1 in 31), kidney failure [usually temporary] (1 in 500), bleeding (1 in 200), allergic reaction [possibly serious] (1 in 200), and agrees to proceed.   2. ESRD on HD at home 4 times/ weeks - He is in process of changing nephrologist. Might need HD post cath.   3. HTN - Stable and well controlled on current regimen.   Medication Adjustments/Labs and Tests Ordered: Current medicines are reviewed at length with the patient today.  Concerns regarding medicines are outlined above.  Medication changes, Labs and Tests ordered today are listed in the Patient Instructions below. Patient Instructions  Medication Instructions:   Your physician recommends that you continue on your current medications as directed. Please refer to the Current Medication list given to you today.   If you need a refill on your cardiac medications before your next appointment, please call your pharmacy.  Labwork: NONE ORDERED  TODAY    Testing/Procedures:NONE ORDERED  TODAY    Follow-Up: BASED UPON CATH RESULTS   Any Other Special Instructions Will Be Listed Below (If Applicable).  Jarrett Soho, [place]  02/16/2017 3:42 PM    [facility] [address], [place], [place]  [address] Phone: [phone]; Fax: [phone]

## 2017-02-16 NOTE — Patient Instructions (Signed)
Medication Instructions:   Your physician recommends that you continue on your current medications as directed. Please refer to the Current Medication list given to you today.   If you need a refill on your cardiac medications before your next appointment, please call your pharmacy.  Labwork: NONE ORDERED  TODAY    Testing/Procedures:NONE ORDERED  TODAY    Follow-Up: BASED UPON CATH RESULTS   Any Other Special Instructions Will Be Listed Below (If Applicable).

## 2017-02-17 ENCOUNTER — Encounter (HOSPITAL_COMMUNITY)
Admission: RE | Disposition: A | Discharge status: Home / Self Care | Payer: Self-pay | Source: Ambulatory Visit | Attending: Interventional Cardiology

## 2017-02-17 ENCOUNTER — Ambulatory Visit (HOSPITAL_COMMUNITY)
Admission: RE | Admit: 2017-02-17 | Discharge: 2017-02-17 | Disposition: A | Discharge status: Home / Self Care | Payer: Medicare Other | Source: Ambulatory Visit | Attending: Cardiovascular Disease | Admitting: Cardiovascular Disease

## 2017-02-17 ENCOUNTER — Ambulatory Visit: Payer: Medicare Other | Admitting: Thoracic Surgery (Cardiothoracic Vascular Surgery)

## 2017-02-17 DIAGNOSIS — I351 Nonrheumatic aortic (valve) insufficiency: Secondary | ICD-10-CM | POA: Diagnosis not present

## 2017-02-17 DIAGNOSIS — J45909 Unspecified asthma, uncomplicated: Secondary | ICD-10-CM | POA: Diagnosis not present

## 2017-02-17 DIAGNOSIS — Z87891 Personal history of nicotine dependence: Secondary | ICD-10-CM | POA: Diagnosis not present

## 2017-02-17 DIAGNOSIS — N2581 Secondary hyperparathyroidism of renal origin: Secondary | ICD-10-CM | POA: Diagnosis not present

## 2017-02-17 DIAGNOSIS — D509 Iron deficiency anemia, unspecified: Secondary | ICD-10-CM | POA: Diagnosis not present

## 2017-02-17 DIAGNOSIS — M109 Gout, unspecified: Secondary | ICD-10-CM | POA: Diagnosis not present

## 2017-02-17 DIAGNOSIS — Z880 Allergy status to penicillin: Secondary | ICD-10-CM | POA: Insufficient documentation

## 2017-02-17 DIAGNOSIS — I120 Hypertensive chronic kidney disease with stage 5 chronic kidney disease or end stage renal disease: Secondary | ICD-10-CM | POA: Insufficient documentation

## 2017-02-17 DIAGNOSIS — B181 Chronic viral hepatitis B without delta-agent: Secondary | ICD-10-CM | POA: Insufficient documentation

## 2017-02-17 DIAGNOSIS — M1990 Unspecified osteoarthritis, unspecified site: Secondary | ICD-10-CM | POA: Diagnosis not present

## 2017-02-17 DIAGNOSIS — D631 Anemia in chronic kidney disease: Secondary | ICD-10-CM | POA: Insufficient documentation

## 2017-02-17 DIAGNOSIS — N186 End stage renal disease: Secondary | ICD-10-CM | POA: Insufficient documentation

## 2017-02-17 DIAGNOSIS — B20 Human immunodeficiency virus [HIV] disease: Secondary | ICD-10-CM | POA: Insufficient documentation

## 2017-02-17 DIAGNOSIS — Z992 Dependence on renal dialysis: Secondary | ICD-10-CM | POA: Insufficient documentation

## 2017-02-17 DIAGNOSIS — E8779 Other fluid overload: Secondary | ICD-10-CM | POA: Diagnosis not present

## 2017-02-17 DIAGNOSIS — I2510 Atherosclerotic heart disease of native coronary artery without angina pectoris: Secondary | ICD-10-CM | POA: Insufficient documentation

## 2017-02-17 DIAGNOSIS — E0590 Thyrotoxicosis, unspecified without thyrotoxic crisis or storm: Secondary | ICD-10-CM | POA: Insufficient documentation

## 2017-02-17 DIAGNOSIS — I2584 Coronary atherosclerosis due to calcified coronary lesion: Secondary | ICD-10-CM | POA: Insufficient documentation

## 2017-02-17 DIAGNOSIS — F419 Anxiety disorder, unspecified: Secondary | ICD-10-CM | POA: Diagnosis not present

## 2017-02-17 HISTORY — PX: RIGHT/LEFT HEART CATH AND CORONARY ANGIOGRAPHY: CATH118266

## 2017-02-17 LAB — POCT I-STAT 3, ART BLOOD GAS (G3+)
Acid-base deficit: 4 mmol/L — ABNORMAL HIGH (ref 0.0–2.0)
Bicarbonate: 22.5 mmol/L (ref 20.0–28.0)
O2 Saturation: 97 %
PCO2 ART: 44.4 mmHg (ref 32.0–48.0)
PO2 ART: 98 mmHg (ref 83.0–108.0)
TCO2: 24 mmol/L (ref 0–100)
pH, Arterial: 7.312 — ABNORMAL LOW (ref 7.350–7.450)

## 2017-02-17 LAB — POCT I-STAT 3, VENOUS BLOOD GAS (G3P V)
Acid-base deficit: 4 mmol/L — ABNORMAL HIGH (ref 0.0–2.0)
BICARBONATE: 22.9 mmol/L (ref 20.0–28.0)
O2 Saturation: 77 %
PH VEN: 7.306 (ref 7.250–7.430)
TCO2: 24 mmol/L (ref 0–100)
pCO2, Ven: 45.9 mmHg (ref 44.0–60.0)
pO2, Ven: 46 mmHg — ABNORMAL HIGH (ref 32.0–45.0)

## 2017-02-17 LAB — HIV-1 RNA QUANT-NO REFLEX-BLD
HIV 1 RNA Quant: <20 copies/mL
HIV-1 RNA QUANT, LOG: NOT DETECTED {Log_copies}/mL

## 2017-02-17 SURGERY — RIGHT/LEFT HEART CATH AND CORONARY ANGIOGRAPHY
Anesthesia: LOCAL

## 2017-02-17 MED ORDER — SODIUM CHLORIDE 0.9 % IV SOLN: 250.0000 mL | INTRAVENOUS | Status: DC | PRN

## 2017-02-17 MED ORDER — ASPIRIN 81 MG PO CHEW
81.0000 mg | CHEWABLE_TABLET | ORAL | Status: AC
  Administered 2017-02-17: 81 mg via ORAL

## 2017-02-17 MED ORDER — MIDAZOLAM HCL 2 MG/2ML IJ SOLN
INTRAMUSCULAR | Status: AC
  Filled 2017-02-17: qty 2

## 2017-02-17 MED ORDER — SODIUM CHLORIDE 0.9% FLUSH: 3.0000 mL | INTRAVENOUS | Status: DC | PRN

## 2017-02-17 MED ORDER — HEPARIN (PORCINE) IN NACL 2-0.9 UNIT/ML-% IJ SOLN
INTRAMUSCULAR | Status: AC
  Filled 2017-02-17: qty 1000

## 2017-02-17 MED ORDER — IOPAMIDOL (ISOVUE-370) INJECTION 76%
INTRAVENOUS | Status: AC
  Filled 2017-02-17: qty 100

## 2017-02-17 MED ORDER — FENTANYL CITRATE (PF) 100 MCG/2ML IJ SOLN
INTRAMUSCULAR | Status: DC | PRN
  Administered 2017-02-17 (×3): 25 ug via INTRAVENOUS

## 2017-02-17 MED ORDER — MIDAZOLAM HCL 2 MG/2ML IJ SOLN
INTRAMUSCULAR | Status: DC | PRN
  Administered 2017-02-17 (×3): 1 mg via INTRAVENOUS

## 2017-02-17 MED ORDER — LIDOCAINE HCL (PF) 1 % IJ SOLN
INTRAMUSCULAR | Status: AC
  Filled 2017-02-17: qty 30

## 2017-02-17 MED ORDER — SODIUM CHLORIDE 0.9% FLUSH: 3.0000 mL | Freq: Two times a day (BID) | INTRAVENOUS | Status: DC

## 2017-02-17 MED ORDER — IOPAMIDOL (ISOVUE-370) INJECTION 76%
INTRAVENOUS | Status: DC | PRN
  Administered 2017-02-17: 85 mL via INTRA_ARTERIAL

## 2017-02-17 MED ORDER — ONDANSETRON HCL 4 MG/2ML IJ SOLN: 4.0000 mg | Freq: Four times a day (QID) | INTRAMUSCULAR | Status: DC | PRN

## 2017-02-17 MED ORDER — ASPIRIN 81 MG PO CHEW
CHEWABLE_TABLET | ORAL | Status: AC
  Administered 2017-02-17: 81 mg via ORAL
  Filled 2017-02-17: qty 1

## 2017-02-17 MED ORDER — LIDOCAINE HCL (PF) 1 % IJ SOLN
INTRAMUSCULAR | Status: DC | PRN
  Administered 2017-02-17: 25 mL

## 2017-02-17 MED ORDER — HEPARIN (PORCINE) IN NACL 2-0.9 UNIT/ML-% IJ SOLN
INTRAMUSCULAR | Status: AC | PRN
  Administered 2017-02-17: 1000 mL

## 2017-02-17 MED ORDER — FENTANYL CITRATE (PF) 100 MCG/2ML IJ SOLN
INTRAMUSCULAR | Status: AC
  Filled 2017-02-17: qty 2

## 2017-02-17 MED ORDER — ACETAMINOPHEN 325 MG PO TABS: 650.0000 mg | ORAL_TABLET | ORAL | Status: DC | PRN

## 2017-02-17 MED ORDER — SODIUM CHLORIDE 0.9 % IV SOLN
INTRAVENOUS | Status: DC
  Administered 2017-02-17: 08:00:00 via INTRAVENOUS

## 2017-02-17 SURGICAL SUPPLY — 13 items
CATH INFINITI 5FR JL5 (CATHETERS) ×2 IMPLANT
CATH INFINITI 5FR MULTPACK ANG (CATHETERS) ×2 IMPLANT
CATH SWAN GANZ 7F STRAIGHT (CATHETERS) ×2 IMPLANT
COVER PRB 48X5XTLSCP FOLD TPE (BAG) ×1 IMPLANT
COVER PROBE 5X48 (BAG) ×1
GUIDEWIRE 3MM J TIP .035 145 (WIRE) ×2 IMPLANT
KIT HEART LEFT (KITS) ×2 IMPLANT
PACK CARDIAC CATHETERIZATION (CUSTOM PROCEDURE TRAY) ×2 IMPLANT
SHEATH PINNACLE 5F 10CM (SHEATH) ×2 IMPLANT
SHEATH PINNACLE 7F 10CM (SHEATH) ×2 IMPLANT
SYR MEDRAD MARK V 150ML (SYRINGE) ×2 IMPLANT
TRANSDUCER W/STOPCOCK (MISCELLANEOUS) ×2 IMPLANT
TUBING CIL FLEX 10 FLL-RA (TUBING) ×2 IMPLANT

## 2017-02-17 NOTE — Discharge Instructions (Signed)
Femoral Site Care °Refer to this sheet in the next few weeks. These instructions provide you with information about caring for yourself after your procedure. Your health care provider may also give you more specific instructions. Your treatment has been planned according to current medical practices, but problems sometimes occur. Call your health care provider if you have any problems or questions after your procedure. °What can I expect after the procedure? °After your procedure, it is typical to have the following: °· Bruising at the site that usually fades within 1-2 weeks. °· Blood collecting in the tissue (hematoma) that may be painful to the touch. It should usually decrease in size and tenderness within 1-2 weeks. °Follow these instructions at home: °· Take medicines only as directed by your health care provider. °· You may shower 24-48 hours after the procedure or as directed by your health care provider. Remove the bandage (dressing) and gently wash the site with plain soap and water. Pat the area dry with a clean towel. Do not rub the site, because this may cause bleeding. °· Do not take baths, swim, or use a hot tub until your health care provider approves. °· Check your insertion site every day for redness, swelling, or drainage. °· Do not apply powder or lotion to the site. °· Limit use of stairs to twice a day for the first 2-3 days or as directed by your health care provider. °· Do not squat for the first 2-3 days or as directed by your health care provider. °· Do not lift over 10 lb (4.5 kg) for 5 days after your procedure or as directed by your health care provider. °· Ask your health care provider when it is okay to: °¨ Return to work or school. °¨ Resume usual physical activities or sports. °¨ Resume sexual activity. °· Do not drive home if you are discharged the same day as the procedure. Have someone else drive you. °· You may drive 24 hours after the procedure unless otherwise instructed by  your health care provider. °· Do not operate machinery or power tools for 24 hours after the procedure or as directed by your health care provider. °· If your procedure was done as an outpatient procedure, which means that you went home the same day as your procedure, a responsible adult should be with you for the first 24 hours after you arrive home. °· Keep all follow-up visits as directed by your health care provider. This is important. °Contact a health care provider if: °· You have a fever. °· You have chills. °· You have increased bleeding from the site. Hold pressure on the site. °Get help right away if: °· You have unusual pain at the site. °· You have redness, warmth, or swelling at the site. °· You have drainage (other than a small amount of blood on the dressing) from the site. °· The site is bleeding, and the bleeding does not stop after 30 minutes of holding steady pressure on the site. °· Your leg or foot becomes pale, cool, tingly, or numb. °This information is not intended to replace advice given to you by your health care provider. Make sure you discuss any questions you have with your health care provider. °Document Released: 05/11/2014 Document Revised: 02/13/2016 Document Reviewed: 03/27/2014 °Elsevier Interactive Patient Education © 2017 [employer] ° °

## 2017-02-17 NOTE — Progress Notes (Signed)
Pt denies pain. Rt groin unremarkable. Lying and standing BP WNL. Ambulated into hall without difficulty. Groin remains clean, dry and intact. Pt and brother state understanding of discharge instructions. Ready for discharge.

## 2017-02-17 NOTE — H&P (View-Only) (Signed)
Cardiology Office Note    Date:  02/16/2017   ID:  Jeffrey [name], DOB 07-23-1956, MRN [id number]  PCP:  Jeffrey Ochs, MD  Cardiologist:  Jeffrey [name] Who will do cath tomorrow (Previously seen by Jeffrey [name])  Chief Complaint: discuss R & L cath   History of Present Illness:   Jeffrey [name] is a 61 y.o. male with hx of HIV, ESRD on home HD (4 times/week), HTN, chronic anemia and aortic insufficiency presented to discussed R & L cath.   Hx of MSSAbacteremia 07/2016 from an unclear source in November associated with a worsening of his aortic insufficiency from mild to moderate over a period of few months. TEE revealed severe aortic regurgitation, no endocarditis.   Echo 04/09/16   EF 60-65%  Moderate LVH G1DD  AV mild AR  LA moderately dilated Echo 07/2016   EF 60-65% moderate LVH G2DD  AV with moderate Aortic regurgitation  LA severely dilated TEE  08/2016  EF 60-65%  Severe Aortic regurgitation , no thrombus in atrium no vegetation.   Seen by APP and Jeffrey [name] 10/06/16 for worsening AI and DOE. Recommended R & L cath for further work up. However, he defer until recently.   Today he states that his DOE is stable. The patient denies nausea, vomiting, fever, chest pain, palpitations, orthopnea, PND, dizziness, syncope, cough, congestion, abdominal pain, hematochezia, melena, lower extremity edema. Pre-cath labs done by Jeffrey [name] 02/10/17.    Past Medical History:  Diagnosis Date  . Anemia   . Anxiety   . Arthritis   . Asthma    per pt hx  . End stage renal disease on home HD 07/10/2011   Started HD in September 2012 at [facility] with a tunneled HD catheter, now on home HD with NxtStage. Dialyzing through AVF L lower arm with buttonhole technique as of mid 2014. His brother does the HD treatments at home.  They are roommates for 23 years.  The brother works 3rd shift and gets off about 8am and then puts Mr [name] on HD in the morning after getting home. Most of the time he does HD  about 4 times a week, for about 4 hours per treatment. Cause of ESRD was HTN according to patient. He says he let his health go and ending up with complications, and that he didn't like seeing doctors in those days.  He says he was diagnosed with severe HTN when he lived in [place] in his 21's.   . Gout   . Hepatitis B carrier ([place])   . HIV infection ([place])   . Hypertension   . Hyperthyroidism   . Pneumonia   . Seizure (Jeffrey [name])   . Seizures ([place]) 06/02/2011   pt denies this  . Thrombocytopenia (Jeffrey [name])     Past Surgical History:  Procedure Laterality Date  . AV FISTULA PLACEMENT  06/02/11   Left radiocephalic AVF  . COLONOSCOPY    . OTHER SURGICAL HISTORY     removal temporary HD catheter   . REVISON OF ARTERIOVENOUS FISTULA Left 10/10/2015   Procedure: REVISON OF LEFT RADIOCEPHALIC ARTERIOVENOUS FISTULA;  Surgeon: Angelia Mould, MD;  Location: [place];  Service: Vascular;  Laterality: Left;  . REVISON OF ARTERIOVENOUS FISTULA Left 02/07/2016   Procedure: REPAIR OF PSEUDO-ANEUREYSM OF LEFT ARM  ARTERIOVENOUS FISTULA;  Surgeon: Angelia Mould, MD;  Location: [place];  Service: Vascular;  Laterality: Left;  . REVISON OF ARTERIOVENOUS FISTULA Left 04/10/2016   Procedure:  PLICATION OF LEFT ARM RADIOCEPHALIC ARTERIOVENOUS FISTULA PSEUDOANEURYSM;  Surgeon: Angelia Mould, MD;  Location: [place];  Service: Vascular;  Laterality: Left;  . TEE WITHOUT CARDIOVERSION N/A 09/11/2016   Procedure: TRANSESOPHAGEAL ECHOCARDIOGRAM (TEE);  Surgeon: Jeffrey Spark, MD;  [facility] ENDOSCOPY;  Service: Cardiovascular;  Laterality: N/A;    Current Medications: Prior to Admission medications   Medication Sig Start Date End Date Taking? Authorizing Provider  albuterol (PROVENTIL) (2.5 MG/3ML) 0.083% nebulizer solution Take 2.5 mg by nebulization every 6 (six) hours as needed for wheezing or shortness of breath.   Yes [provider]  ALPRAZolam (XANAX) 0.25 MG tablet Take 0.25 mg by  mouth daily as needed for anxiety.   Yes [provider]  amLODipine (NORVASC) 10 MG tablet Take 5 mg by mouth 2 (two) times daily.    Yes [provider]  calcium acetate (PHOSLO) 667 MG capsule Take 667-2,001 mg by mouth See admin instructions. 1,334-2,001 mg three times a day with each meal and 667-1,334 mg with each snack 06/06/15  Yes [provider]  carvedilol (COREG) 25 MG tablet Take 25 mg by mouth 2 (two) times daily with a meal.   Yes [provider]  cloNIDine (CATAPRES - DOSED IN MG/24 HR) 0.1 mg/24hr patch Place 1 patch (0.1 mg total) onto the skin every 7 (seven) days. 12/10/16 12/10/17 Yes Jeffrey Ochs, MD  COMBIVENT RESPIMAT 20-100 MCG/ACT AERS respimat INHALE 1 PUFF BY MOUTH EVERY 4 HOURS AS NEEDED FOR WHEEZING OR SHORTNESS OF BREATH 06/09/16  Yes Jeffrey Shoulder, MD  doxazosin (CARDURA) 2 MG tablet Take 4 mg by mouth daily. 11/10/16  Yes [provider]  EPIVIR HBV 5 MG/ML solution TAKE 5 ML BY MOUTH EVERY DAY 11/23/16  Yes Jeffrey Riches, MD  ethyl chloride spray Apply 1 application topically daily as needed. At HD 05/20/15  Yes [provider]  hydrALAZINE (APRESOLINE) 100 MG tablet Take 100 mg by mouth 3 (three) times daily.  08/02/16  Yes [provider]  ISENTRESS 400 MG tablet TAKE 1 TABLET(400 MG) BY MOUTH TWICE DAILY 08/03/16  Yes Jeffrey Riches, MD  multivitamin (RENA-VIT) TABS tablet Take 1 tablet by mouth daily.     Yes [provider]  tenofovir (VIREAD) 300 MG tablet TAKE 1 TABLET BY MOUTH EVERY SATURDAY AFTER DIALYSIS 09/22/16  Yes Jeffrey Riches, MD  traZODone (DESYREL) 50 MG tablet Take 50 mg by mouth at bedtime as needed for sleep.   Yes [provider]    Allergies:   Lisinopril and Penicillins   Social History   Social History  . Marital status: Single    Spouse name: N/A  . Number of children: N/A  . Years of education: N/A   Social History Main Topics  . Smoking  status: Former Smoker    Types: Cigarettes    Quit date: 08/08/2015  . Smokeless tobacco: Never Used  . Alcohol use No  . Drug use: No     Comment: uds (+) cocaine in 08/2011 but pt states was taking sudafed at the time  . Sexual activity: Not Currently    Partners: Male   Other Topics Concern  . 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     Family History:  The  patient's family history includes Cancer in his father; Diabetes in his mother; Hypertension in his mother.   ROS:   Please see the history of present illness.    ROS All other systems reviewed and are negative.   PHYSICAL EXAM:   VS:  BP 118/62   Pulse 75   Ht 6' (1.829 m)   Wt 179 lb 12.8 oz (81.6 kg)   BMI 24.39 kg/m    GEN: Well nourished, well developed, in no acute distress  HEENT: normal  Neck: no JVD, carotid bruits, or masses Cardiac: RRR; no murmurs, rubs, or gallops,no edema  Respiratory:  clear to auscultation bilaterally, normal work of breathing GI: soft, nontender, nondistended, + BS MS: no deformity or atrophy  Skin: warm and dry, no rash Neuro:  Alert and Oriented x 3, Strength and sensation are intact Psych: euthymic mood, full affect  Wt Readings from Last 3 Encounters:  02/16/17 179 lb 12.8 oz (81.6 kg)  02/10/17 178 lb (80.7 kg)  12/31/16 178 lb 8 oz (81 kg)      Studies/Labs Reviewed:   EKG:  EKG is ordered today.    Recent Labs: 08/06/2016: Magnesium 1.9 09/09/2016: B Natriuretic Peptide 1,040.1 02/10/2017: ALT 6; BUN 89; Creat 13.81; Hemoglobin 12.6; Platelets 108; Potassium 4.7; Sodium 137   Lipid Panel    Component Value Date/Time   CHOL 77 02/10/2017 1619   TRIG 27 02/10/2017 1619   HDL 34 (L) 02/10/2017 1619   CHOLHDL 2.3 02/10/2017 1619   VLDL 5 02/10/2017 1619   LDLCALC 38 02/10/2017 1619    Additional  studies/ records that were reviewed today include:   As above  ASSESSMENT & PLAN:     1. Severe aortic regurgitation - Stable DOE. No edema, dizziness or syncope. R & L cath for further work up as schedule tomorrow.   The patient understands that risks include but are not limited to stroke (1 in 1000), death (1 in 88), kidney failure [usually temporary] (1 in 500), bleeding (1 in 200), allergic reaction [possibly serious] (1 in 200), and agrees to proceed.   2. ESRD on HD at home 4 times/ weeks - He is in process of changing nephrologist. Might need HD post cath.   3. HTN - Stable and well controlled on current regimen.   Medication Adjustments/Labs and Tests Ordered: Current medicines are reviewed at length with the patient today.  Concerns regarding medicines are outlined above.  Medication changes, Labs and Tests ordered today are listed in the Patient Instructions below. Patient Instructions  Medication Instructions:   Your physician recommends that you continue on your current medications as directed. Please refer to the Current Medication list given to you today.   If you need a refill on your cardiac medications before your next appointment, please call your pharmacy.  Labwork: NONE ORDERED  TODAY    Testing/Procedures:NONE ORDERED  TODAY    Follow-Up: BASED UPON CATH RESULTS   Any Other Special Instructions Will Be Listed Below (If Applicable).  Jarrett Soho, [place]  02/16/2017 3:42 PM    [name] [place], [place], [place]  [address] Phone: [phone]; Fax: [phone]

## 2017-02-17 NOTE — Progress Notes (Signed)
Site area: rt groin fa and fv sheaths Site Prior to Removal:  Level 0 Pressure Applied For: 20 minutes Manual:   yes Patient Status During Pull: stable  Post Pull Site:  Level 0 Post Pull Instructions Given:  yes Post Pull Pulses Present: yes Dressing Applied:  Gauze and tegaderm  Bedrest begins @ 0945 Comments:

## 2017-02-17 NOTE — Interval H&P Note (Signed)
History and Physical Interval Note:  02/17/2017 8:39 AM  [name]  has presented today for surgery, with the diagnosis of aortic valve insufficiency  The various methods of treatment have been discussed with the patient and family. After consideration of risks, benefits and other options for treatment, the patient has consented to  Procedure(s): Right/Left Heart Cath and Coronary Angiography (N/A) as a surgical intervention .  The patient's history has been reviewed, patient examined, no change in status, stable for surgery.  I have reviewed the patient's chart and labs.  Questions were answered to the patient's satisfaction.     Sherren Mocha

## 2017-02-18 ENCOUNTER — Telehealth: Payer: Self-pay

## 2017-02-18 ENCOUNTER — Encounter (HOSPITAL_COMMUNITY): Payer: Self-pay | Admitting: Cardiovascular Disease

## 2017-02-18 DIAGNOSIS — N186 End stage renal disease: Secondary | ICD-10-CM | POA: Diagnosis not present

## 2017-02-18 DIAGNOSIS — Z992 Dependence on renal dialysis: Secondary | ICD-10-CM | POA: Diagnosis not present

## 2017-02-18 DIAGNOSIS — D509 Iron deficiency anemia, unspecified: Secondary | ICD-10-CM | POA: Diagnosis not present

## 2017-02-18 DIAGNOSIS — D631 Anemia in chronic kidney disease: Secondary | ICD-10-CM | POA: Diagnosis not present

## 2017-02-18 DIAGNOSIS — N2581 Secondary hyperparathyroidism of renal origin: Secondary | ICD-10-CM | POA: Diagnosis not present

## 2017-02-18 DIAGNOSIS — E8779 Other fluid overload: Secondary | ICD-10-CM | POA: Diagnosis not present

## 2017-02-18 DIAGNOSIS — I120 Hypertensive chronic kidney disease with stage 5 chronic kidney disease or end stage renal disease: Secondary | ICD-10-CM | POA: Diagnosis not present

## 2017-02-18 NOTE — Telephone Encounter (Signed)
Patient contacted regarding discharge from [facility] s/p catheterization 02/17/2017  Pt understands to follow up 02/23/2017 @ 9:30 am with Dr. Roxan Hockey Pt understands discharge instructions?  Yes  Pt understands medication regimen? No change  Pt states he is doing well.  Pt endorses slight pain at cath site, but states the area is soft when he gently palpates it.    Notified Pt some soreness normal, encouraged Pt to keep monitoring site and notify us if Pt notices any changes in pain level or any s/s of infection.  Pt indicates understanding.  Pt states next step is to schedule the surgery.  Confirmed appt with Dr. Roxan Hockey.  Pt denies any further educational needs.  Pt appreciative of call.

## 2017-02-19 DIAGNOSIS — D631 Anemia in chronic kidney disease: Secondary | ICD-10-CM | POA: Diagnosis not present

## 2017-02-19 DIAGNOSIS — N2581 Secondary hyperparathyroidism of renal origin: Secondary | ICD-10-CM | POA: Diagnosis not present

## 2017-02-19 DIAGNOSIS — N186 End stage renal disease: Secondary | ICD-10-CM | POA: Diagnosis not present

## 2017-02-19 DIAGNOSIS — D509 Iron deficiency anemia, unspecified: Secondary | ICD-10-CM | POA: Diagnosis not present

## 2017-02-19 DIAGNOSIS — E8779 Other fluid overload: Secondary | ICD-10-CM | POA: Diagnosis not present

## 2017-02-22 DIAGNOSIS — D631 Anemia in chronic kidney disease: Secondary | ICD-10-CM | POA: Diagnosis not present

## 2017-02-22 DIAGNOSIS — N2581 Secondary hyperparathyroidism of renal origin: Secondary | ICD-10-CM | POA: Diagnosis not present

## 2017-02-22 DIAGNOSIS — N186 End stage renal disease: Secondary | ICD-10-CM | POA: Diagnosis not present

## 2017-02-22 DIAGNOSIS — E8779 Other fluid overload: Secondary | ICD-10-CM | POA: Diagnosis not present

## 2017-02-22 DIAGNOSIS — D509 Iron deficiency anemia, unspecified: Secondary | ICD-10-CM | POA: Diagnosis not present

## 2017-02-23 ENCOUNTER — Ambulatory Visit: Payer: Medicare Other | Admitting: Thoracic Surgery (Cardiothoracic Vascular Surgery)

## 2017-02-23 DIAGNOSIS — E8779 Other fluid overload: Secondary | ICD-10-CM | POA: Diagnosis not present

## 2017-02-23 DIAGNOSIS — D631 Anemia in chronic kidney disease: Secondary | ICD-10-CM | POA: Diagnosis not present

## 2017-02-23 DIAGNOSIS — D509 Iron deficiency anemia, unspecified: Secondary | ICD-10-CM | POA: Diagnosis not present

## 2017-02-23 DIAGNOSIS — N2581 Secondary hyperparathyroidism of renal origin: Secondary | ICD-10-CM | POA: Diagnosis not present

## 2017-02-23 DIAGNOSIS — N186 End stage renal disease: Secondary | ICD-10-CM | POA: Diagnosis not present

## 2017-02-23 NOTE — Progress Notes (Unsigned)
This encounter was created in error - please disregard.

## 2017-02-24 DIAGNOSIS — D509 Iron deficiency anemia, unspecified: Secondary | ICD-10-CM | POA: Diagnosis not present

## 2017-02-24 DIAGNOSIS — E8779 Other fluid overload: Secondary | ICD-10-CM | POA: Diagnosis not present

## 2017-02-24 DIAGNOSIS — D631 Anemia in chronic kidney disease: Secondary | ICD-10-CM | POA: Diagnosis not present

## 2017-02-24 DIAGNOSIS — N2581 Secondary hyperparathyroidism of renal origin: Secondary | ICD-10-CM | POA: Diagnosis not present

## 2017-02-24 DIAGNOSIS — N186 End stage renal disease: Secondary | ICD-10-CM | POA: Diagnosis not present

## 2017-02-25 DIAGNOSIS — E8779 Other fluid overload: Secondary | ICD-10-CM | POA: Diagnosis not present

## 2017-02-25 DIAGNOSIS — N186 End stage renal disease: Secondary | ICD-10-CM | POA: Diagnosis not present

## 2017-02-25 DIAGNOSIS — D509 Iron deficiency anemia, unspecified: Secondary | ICD-10-CM | POA: Diagnosis not present

## 2017-02-25 DIAGNOSIS — D631 Anemia in chronic kidney disease: Secondary | ICD-10-CM | POA: Diagnosis not present

## 2017-02-25 DIAGNOSIS — N2581 Secondary hyperparathyroidism of renal origin: Secondary | ICD-10-CM | POA: Diagnosis not present

## 2017-02-26 DIAGNOSIS — N186 End stage renal disease: Secondary | ICD-10-CM | POA: Diagnosis not present

## 2017-02-26 DIAGNOSIS — D509 Iron deficiency anemia, unspecified: Secondary | ICD-10-CM | POA: Diagnosis not present

## 2017-02-26 DIAGNOSIS — D631 Anemia in chronic kidney disease: Secondary | ICD-10-CM | POA: Diagnosis not present

## 2017-02-26 DIAGNOSIS — E8779 Other fluid overload: Secondary | ICD-10-CM | POA: Diagnosis not present

## 2017-02-26 DIAGNOSIS — N2581 Secondary hyperparathyroidism of renal origin: Secondary | ICD-10-CM | POA: Diagnosis not present

## 2017-03-01 ENCOUNTER — Ambulatory Visit (INDEPENDENT_AMBULATORY_CARE_PROVIDER_SITE_OTHER): Payer: Medicare Other | Admitting: Thoracic Surgery (Cardiothoracic Vascular Surgery)

## 2017-03-01 ENCOUNTER — Other Ambulatory Visit: Payer: Self-pay | Admitting: *Deleted

## 2017-03-01 ENCOUNTER — Encounter: Payer: Self-pay | Admitting: Thoracic Surgery (Cardiothoracic Vascular Surgery)

## 2017-03-01 VITALS — BP 115/67 | HR 72 | Resp 16 | Ht 72.0 in | Wt 182.6 lb

## 2017-03-01 DIAGNOSIS — I351 Nonrheumatic aortic (valve) insufficiency: Secondary | ICD-10-CM

## 2017-03-01 DIAGNOSIS — D631 Anemia in chronic kidney disease: Secondary | ICD-10-CM | POA: Diagnosis not present

## 2017-03-01 DIAGNOSIS — N186 End stage renal disease: Secondary | ICD-10-CM | POA: Diagnosis not present

## 2017-03-01 DIAGNOSIS — D509 Iron deficiency anemia, unspecified: Secondary | ICD-10-CM | POA: Diagnosis not present

## 2017-03-01 DIAGNOSIS — R918 Other nonspecific abnormal finding of lung field: Secondary | ICD-10-CM

## 2017-03-01 DIAGNOSIS — E8779 Other fluid overload: Secondary | ICD-10-CM | POA: Diagnosis not present

## 2017-03-01 DIAGNOSIS — I7789 Other specified disorders of arteries and arterioles: Secondary | ICD-10-CM

## 2017-03-01 DIAGNOSIS — M109 Gout, unspecified: Secondary | ICD-10-CM | POA: Diagnosis not present

## 2017-03-01 DIAGNOSIS — N2581 Secondary hyperparathyroidism of renal origin: Secondary | ICD-10-CM | POA: Diagnosis not present

## 2017-03-01 DIAGNOSIS — E785 Hyperlipidemia, unspecified: Secondary | ICD-10-CM | POA: Diagnosis not present

## 2017-03-01 NOTE — Progress Notes (Signed)
PCP is Velna Ochs, MD Referring Provider is Evans Lance, MD  Chief Complaint  Patient presents with  . Follow-up    to review CATH 02/17/17 and schedule surgery for SEVERE AORTIC INSUFFICIENCY    HPI: Jeffrey [name] returns to discuss aortic valve replacement  Jeffrey [name] is a 61 year old man with a past history significant for end-stage renal disease on hemodialysis since 2012, hepatitis B, HIV, seizures, asthma, arthritis, anemia of chronic disease, hypertension, hyperthyroidism, gallop, thrombocytopenia, and peripheral arterial disease (turndown for renal transplant at [facility] due to calcification).  He was hospitalized in November with low back pain. He had methicillin sensitive staph aureus bacteremia. He was treated with Ancef intravenously for 2 weeks. He was readmitted on 09/09/2016 with shortness of breath and worsening anemia. A transesophageal echocardiogram showed severe aortic insufficiency but no definite vegetation. He had preserved left ventricular function.  I saw him in the office in February for consideration for aortic valve replacement. He was unsure as to whether he would like to proceed. He finds that he would like to proceed and underwent cardiac catheterization last week. He had calcified coronaries diffusely but no hemodynamically significant stenoses. His aorta was noted to be up to 4.1 cm in diameter.   Past Medical History:  Diagnosis Date  . Anemia   . Anxiety   . Arthritis   . Asthma    per pt hx  . End stage renal disease on home HD 07/10/2011   Started HD in September 2012 at [facility] with a tunneled HD catheter, now on home HD with NxtStage. Dialyzing through AVF L lower arm with buttonhole technique as of mid 2014. His brother does the HD treatments at home.  They are roommates for 23 years.  The brother works 3rd shift and gets off about 8am and then puts Jeffrey [name] on HD in the morning after getting home. Most of the time he does HD about 4 times a week, for  about 4 hours per treatment. Cause of ESRD was HTN according to patient. He says he let his health go and ending up with complications, and that he didn't like seeing doctors in those days.  He says he was diagnosed with severe HTN when he lived in [place] in his 21's.   . Gout   . Hepatitis B carrier ([place])   . HIV infection ([place])   . Hypertension   . Hyperthyroidism   . Pneumonia   . Seizure ([place])   . Seizures ([place]) 06/02/2011   pt denies this  . Thrombocytopenia ([place])     Past Surgical History:  Procedure Laterality Date  . AV FISTULA PLACEMENT  06/02/11   Left radiocephalic AVF  . COLONOSCOPY    . OTHER SURGICAL HISTORY     removal temporary HD catheter   . REVISON OF ARTERIOVENOUS FISTULA Left 10/10/2015   Procedure: REVISON OF LEFT RADIOCEPHALIC ARTERIOVENOUS FISTULA;  Surgeon: Angelia Mould, MD;  Location: [place];  Service: Vascular;  Laterality: Left;  . REVISON OF ARTERIOVENOUS FISTULA Left 02/07/2016   Procedure: REPAIR OF PSEUDO-ANEUREYSM OF LEFT ARM  ARTERIOVENOUS FISTULA;  Surgeon: Angelia Mould, MD;  Location: [place];  Service: Vascular;  Laterality: Left;  . REVISON OF ARTERIOVENOUS FISTULA Left 6/83/4196   Procedure: PLICATION OF LEFT ARM RADIOCEPHALIC ARTERIOVENOUS FISTULA PSEUDOANEURYSM;  Surgeon: Angelia Mould, MD;  Location: [place];  Service: Vascular;  Laterality: Left;  . RIGHT/LEFT HEART CATH AND CORONARY ANGIOGRAPHY N/A 02/17/2017   Procedure: Right/Left Heart  Cath and Coronary Angiography;  Surgeon: Sherren Mocha, MD;  Location: [place] CV LAB;  Service: Cardiovascular;  Laterality: N/A;  . TEE WITHOUT CARDIOVERSION N/A 09/11/2016   Procedure: TRANSESOPHAGEAL ECHOCARDIOGRAM (TEE);  Surgeon: Dorothy Spark, MD;  Location: [facility] ENDOSCOPY;  Service: Cardiovascular;  Laterality: N/A;    Family History  Problem Relation Age of Onset  . Hypertension Mother   . Diabetes Mother   . Cancer Father     Social History Social History   Substance Use Topics  . Smoking status: Former Smoker    Types: Cigarettes    Quit date: 08/08/2015  . Smokeless tobacco: Never Used  . Alcohol use No    Current Outpatient Prescriptions  Medication Sig Dispense Refill  . albuterol (PROVENTIL) (2.5 MG/3ML) 0.083% nebulizer solution Take 2.5 mg by nebulization every 6 (six) hours as needed for wheezing or shortness of breath.    . ALPRAZolam (XANAX) 0.25 MG tablet Take 0.25 mg by mouth daily as needed for anxiety.    Marland Kitchen amLODipine (NORVASC) 10 MG tablet Take 5 mg by mouth 2 (two) times daily.     . calcium acetate (PHOSLO) 667 MG capsule Take 667-2,001 mg by mouth See admin instructions. 1,334-2,001 mg three times a day with each meal and 667-1,334 mg with each snack    . carvedilol (COREG) 25 MG tablet Take 25 mg by mouth 2 (two) times daily with a meal.    . cloNIDine (CATAPRES - DOSED IN MG/24 HR) 0.1 mg/24hr patch Place 1 patch (0.1 mg total) onto the skin every 7 (seven) days. 4 patch 2  . COMBIVENT RESPIMAT 20-100 MCG/ACT AERS respimat INHALE 1 PUFF BY MOUTH EVERY 4 HOURS AS NEEDED FOR WHEEZING OR SHORTNESS OF BREATH 4 g 0  . doxazosin (CARDURA) 2 MG tablet Take 4 mg by mouth daily.  11  . EPIVIR HBV 5 MG/ML solution TAKE 5 ML BY MOUTH EVERY DAY 240 mL 11  . ethyl chloride spray Apply 1 application topically daily as needed. At HD  12  . hydrALAZINE (APRESOLINE) 100 MG tablet Take 100 mg by mouth 3 (three) times daily.     . ISENTRESS 400 MG tablet TAKE 1 TABLET(400 MG) BY MOUTH TWICE DAILY 60 tablet 6  . multivitamin (RENA-VIT) TABS tablet Take 1 tablet by mouth daily.      Marland Kitchen tenofovir (VIREAD) 300 MG tablet TAKE 1 TABLET BY MOUTH EVERY SATURDAY AFTER DIALYSIS 5 tablet 5  . traZODone (DESYREL) 50 MG tablet Take 50 mg by mouth at bedtime as needed for sleep.     No current facility-administered medications for this visit.     Allergies  Allergen Reactions  . Lisinopril Anaphylaxis and Shortness Of Breath    Throat swelling  .  Penicillins Other (See Comments)    Childhood allergy Has patient had a PCN reaction causing immediate rash, facial/tongue/throat swelling, SOB or lightheadedness with hypotension: Unk Has patient had a PCN reaction causing severe rash involving mucus membranes or skin necrosis: Unk Has patient had a PCN reaction that required hospitalization: Unk Has patient had a PCN reaction occurring within the last 10 years: No If all of the above answers are "NO", then may proceed with Cephalosporin use.     Review of Systems  Constitutional: Negative for chills, fever and unexpected weight change.  HENT: Positive for dental problem (Dentures).   Eyes: Negative for visual disturbance.  Respiratory: Positive for shortness of breath (Mild with exertion). Negative for cough and wheezing.  Cardiovascular: Negative for chest pain and leg swelling.  Gastrointestinal: Negative for abdominal pain and blood in stool.  Musculoskeletal: Positive for arthralgias and joint swelling.  Neurological: Positive for seizures (Remote). Negative for dizziness, syncope, speech difficulty and weakness.  All other systems reviewed and are negative.   BP 115/67 (BP Location: Right Arm, Patient Position: Sitting, Cuff Size: Large)   Pulse 72   Resp 16   Ht 6' (1.829 m)   Wt 182 lb 9.6 oz (82.8 kg)   SpO2 95% Comment: ON RA  BMI 24.77 kg/m  Physical Exam  Constitutional: He is oriented to person, place, and time. He appears well-developed and well-nourished. No distress.  HENT:  Head: Normocephalic and atraumatic.  Mouth/Throat: No oropharyngeal exudate.  Eyes: Conjunctivae and EOM are normal. No scleral icterus.  Neck: Neck supple. No thyromegaly present.  Cardiovascular:  Left arm AV fistula with thrill  Pulmonary/Chest: Effort normal and breath sounds normal. No respiratory distress. He has no wheezes. He has no rales.  Abdominal: Soft. He exhibits no distension. There is no tenderness.  Musculoskeletal: He  exhibits no edema or deformity.  Lymphadenopathy:    He has no cervical adenopathy.  Neurological: He is alert and oriented to person, place, and time. No cranial nerve deficit.  Skin: Skin is warm and dry.  Vitals reviewed.    Diagnostic Tests: I personally reviewed his cardiac catheterization films which showed no hemodynamic significant stenoses.  Impression: Jeffrey [name] is a 61 year old man with multiple medical issues including end-stage renal disease, HIV, hepatitis B, leukopenia, anemia, thrombocytopenia, asthma, arthritis, and hypertension. He was found back in December to have severe aortic insufficiency after presenting with shortness of breath. I saw him that that worried and recommended aortic valve replacement. It was fairly well compensated at that point. He wanted to think over his options before making this decision and then now several months later has decided he would like to proceed with aortic valve replacement.   His cardiac catheterization showed no significant stenoses. It did however show that the ascending aorta was generous in size. I would like to get a noncontrast CT to get a better idea of the size of the aorta sure that the ascending aorta does not need to be replaced at the same setting. I don't think it will but a CT will help determine that for certain.  Its also been 6 months since his most recent echo and I think we should repeat a transthoracic echo prior to surgery just to make sure that there are no other issues that need to be addressed.  He does wish to opt for a tissue valve. He understands the disadvantage of the tissue valve and a renal failure patient is the risk of early stenosis. He could potentially be a candidate for a valve in valve TAVR if that were to occur. A mechanical valve is an option but would require lifelong anticoagulation.  Plan: 2-D echocardiogram and noncontrast CT of chest  Return in 2-3 weeks to schedule surgery.  [name], MD [facility] [phone]

## 2017-03-02 ENCOUNTER — Other Ambulatory Visit: Payer: Self-pay | Admitting: Internal Medicine

## 2017-03-02 DIAGNOSIS — N2581 Secondary hyperparathyroidism of renal origin: Secondary | ICD-10-CM | POA: Diagnosis not present

## 2017-03-02 DIAGNOSIS — E8779 Other fluid overload: Secondary | ICD-10-CM | POA: Diagnosis not present

## 2017-03-02 DIAGNOSIS — N186 End stage renal disease: Secondary | ICD-10-CM | POA: Diagnosis not present

## 2017-03-02 DIAGNOSIS — D631 Anemia in chronic kidney disease: Secondary | ICD-10-CM | POA: Diagnosis not present

## 2017-03-02 DIAGNOSIS — D509 Iron deficiency anemia, unspecified: Secondary | ICD-10-CM | POA: Diagnosis not present

## 2017-03-02 DIAGNOSIS — I10 Essential (primary) hypertension: Secondary | ICD-10-CM

## 2017-03-03 DIAGNOSIS — N2581 Secondary hyperparathyroidism of renal origin: Secondary | ICD-10-CM | POA: Diagnosis not present

## 2017-03-03 DIAGNOSIS — D631 Anemia in chronic kidney disease: Secondary | ICD-10-CM | POA: Diagnosis not present

## 2017-03-03 DIAGNOSIS — N186 End stage renal disease: Secondary | ICD-10-CM | POA: Diagnosis not present

## 2017-03-03 DIAGNOSIS — E8779 Other fluid overload: Secondary | ICD-10-CM | POA: Diagnosis not present

## 2017-03-03 DIAGNOSIS — D509 Iron deficiency anemia, unspecified: Secondary | ICD-10-CM | POA: Diagnosis not present

## 2017-03-04 DIAGNOSIS — N186 End stage renal disease: Secondary | ICD-10-CM | POA: Diagnosis not present

## 2017-03-04 DIAGNOSIS — N2581 Secondary hyperparathyroidism of renal origin: Secondary | ICD-10-CM | POA: Diagnosis not present

## 2017-03-04 DIAGNOSIS — E8779 Other fluid overload: Secondary | ICD-10-CM | POA: Diagnosis not present

## 2017-03-04 DIAGNOSIS — D631 Anemia in chronic kidney disease: Secondary | ICD-10-CM | POA: Diagnosis not present

## 2017-03-04 DIAGNOSIS — D509 Iron deficiency anemia, unspecified: Secondary | ICD-10-CM | POA: Diagnosis not present

## 2017-03-05 DIAGNOSIS — E8779 Other fluid overload: Secondary | ICD-10-CM | POA: Diagnosis not present

## 2017-03-05 DIAGNOSIS — N186 End stage renal disease: Secondary | ICD-10-CM | POA: Diagnosis not present

## 2017-03-05 DIAGNOSIS — D509 Iron deficiency anemia, unspecified: Secondary | ICD-10-CM | POA: Diagnosis not present

## 2017-03-05 DIAGNOSIS — N2581 Secondary hyperparathyroidism of renal origin: Secondary | ICD-10-CM | POA: Diagnosis not present

## 2017-03-05 DIAGNOSIS — D631 Anemia in chronic kidney disease: Secondary | ICD-10-CM | POA: Diagnosis not present

## 2017-03-08 DIAGNOSIS — N2581 Secondary hyperparathyroidism of renal origin: Secondary | ICD-10-CM | POA: Diagnosis not present

## 2017-03-08 DIAGNOSIS — D631 Anemia in chronic kidney disease: Secondary | ICD-10-CM | POA: Diagnosis not present

## 2017-03-08 DIAGNOSIS — E8779 Other fluid overload: Secondary | ICD-10-CM | POA: Diagnosis not present

## 2017-03-08 DIAGNOSIS — N186 End stage renal disease: Secondary | ICD-10-CM | POA: Diagnosis not present

## 2017-03-08 DIAGNOSIS — D509 Iron deficiency anemia, unspecified: Secondary | ICD-10-CM | POA: Diagnosis not present

## 2017-03-08 MED FILL — ETHYL CHLORIDE SPRAY: 25 days supply | Qty: 104 | Fill #2

## 2017-03-09 ENCOUNTER — Ambulatory Visit (HOSPITAL_COMMUNITY)
Admission: RE | Admit: 2017-03-09 | Discharge: 2017-03-09 | Disposition: A | Discharge status: Home / Self Care | Payer: Medicare Other | Source: Ambulatory Visit | Attending: Thoracic Surgery (Cardiothoracic Vascular Surgery) | Admitting: Thoracic Surgery (Cardiothoracic Vascular Surgery)

## 2017-03-09 DIAGNOSIS — J45909 Unspecified asthma, uncomplicated: Secondary | ICD-10-CM | POA: Diagnosis not present

## 2017-03-09 DIAGNOSIS — I351 Nonrheumatic aortic (valve) insufficiency: Secondary | ICD-10-CM | POA: Insufficient documentation

## 2017-03-09 DIAGNOSIS — D649 Anemia, unspecified: Secondary | ICD-10-CM | POA: Diagnosis not present

## 2017-03-09 DIAGNOSIS — N2581 Secondary hyperparathyroidism of renal origin: Secondary | ICD-10-CM | POA: Diagnosis not present

## 2017-03-09 DIAGNOSIS — D631 Anemia in chronic kidney disease: Secondary | ICD-10-CM | POA: Diagnosis not present

## 2017-03-09 DIAGNOSIS — E8779 Other fluid overload: Secondary | ICD-10-CM | POA: Diagnosis not present

## 2017-03-09 DIAGNOSIS — I739 Peripheral vascular disease, unspecified: Secondary | ICD-10-CM | POA: Diagnosis not present

## 2017-03-09 DIAGNOSIS — I120 Hypertensive chronic kidney disease with stage 5 chronic kidney disease or end stage renal disease: Secondary | ICD-10-CM | POA: Diagnosis not present

## 2017-03-09 DIAGNOSIS — D509 Iron deficiency anemia, unspecified: Secondary | ICD-10-CM | POA: Diagnosis not present

## 2017-03-09 DIAGNOSIS — B1910 Unspecified viral hepatitis B without hepatic coma: Secondary | ICD-10-CM | POA: Insufficient documentation

## 2017-03-09 DIAGNOSIS — N186 End stage renal disease: Secondary | ICD-10-CM | POA: Insufficient documentation

## 2017-03-09 DIAGNOSIS — B20 Human immunodeficiency virus [HIV] disease: Secondary | ICD-10-CM | POA: Insufficient documentation

## 2017-03-09 NOTE — Progress Notes (Signed)
  Echocardiogram 2D Echocardiogram has been performed.  Darlina Sicilian M 03/09/2017, 11:44 AM

## 2017-03-10 DIAGNOSIS — D631 Anemia in chronic kidney disease: Secondary | ICD-10-CM | POA: Diagnosis not present

## 2017-03-10 DIAGNOSIS — N2581 Secondary hyperparathyroidism of renal origin: Secondary | ICD-10-CM | POA: Diagnosis not present

## 2017-03-10 DIAGNOSIS — E8779 Other fluid overload: Secondary | ICD-10-CM | POA: Diagnosis not present

## 2017-03-10 DIAGNOSIS — N186 End stage renal disease: Secondary | ICD-10-CM | POA: Diagnosis not present

## 2017-03-10 DIAGNOSIS — D509 Iron deficiency anemia, unspecified: Secondary | ICD-10-CM | POA: Diagnosis not present

## 2017-03-11 ENCOUNTER — Telehealth: Payer: Self-pay | Admitting: Cardiovascular Disease

## 2017-03-11 DIAGNOSIS — D509 Iron deficiency anemia, unspecified: Secondary | ICD-10-CM | POA: Diagnosis not present

## 2017-03-11 DIAGNOSIS — N186 End stage renal disease: Secondary | ICD-10-CM | POA: Diagnosis not present

## 2017-03-11 DIAGNOSIS — N2581 Secondary hyperparathyroidism of renal origin: Secondary | ICD-10-CM | POA: Diagnosis not present

## 2017-03-11 DIAGNOSIS — D631 Anemia in chronic kidney disease: Secondary | ICD-10-CM | POA: Diagnosis not present

## 2017-03-11 DIAGNOSIS — E8779 Other fluid overload: Secondary | ICD-10-CM | POA: Diagnosis not present

## 2017-03-11 NOTE — Telephone Encounter (Signed)
I spoke with the pt's brother and he states that Nephrology is teminating home dialysis on 03/13/17.  Dr Joelyn Oms at [facility] is managing the patient's dialysis and due to non compliance they are terminating home dialysis and advised that the pt must have dialysis in an outpatient treatment facility.  Per [name] the pt is unable to do this because he does not feel well.  He asked that cardiology contact Nephrology to discuss continuing home dialysis until the pt can have heart surgery. The pt does go every month for lab check with Nephrology.  I advised [name] that he will need to discuss these issues with Nephrology and that Cardiology cannot advise on home dialysis. If the pt has been non compliant then [facility] has policies and procedures that they must follow to ensure a patient's safety.

## 2017-03-11 NOTE — Telephone Encounter (Signed)
New message      Brother calling to inform cardiologist that the in home dialysis people are getting ready to remove their equipment because pt is noncompliant.  He states that the pt is having aortic valve surgery by Dr Roxan Hockey in the future.  He states that the patient is noncompliant because he does not feel good at all.  He takes dialysis every other day.  Calling to see if his cardiologist would call the in home dialysis company to ask them to allow pt to keep the dialysis equipment until he has his surgery.  Dr Roxan Hockey told pt to call the cardiologist for this request.  Unclear who is the attending cardiologist.  Please call brother and let him know if we can help in this matter.

## 2017-03-12 ENCOUNTER — Encounter: Payer: Self-pay | Admitting: Nephrology

## 2017-03-12 DIAGNOSIS — N186 End stage renal disease: Secondary | ICD-10-CM | POA: Diagnosis not present

## 2017-03-12 DIAGNOSIS — D631 Anemia in chronic kidney disease: Secondary | ICD-10-CM | POA: Diagnosis not present

## 2017-03-12 DIAGNOSIS — N2581 Secondary hyperparathyroidism of renal origin: Secondary | ICD-10-CM | POA: Diagnosis not present

## 2017-03-12 DIAGNOSIS — E8779 Other fluid overload: Secondary | ICD-10-CM | POA: Diagnosis not present

## 2017-03-12 DIAGNOSIS — D509 Iron deficiency anemia, unspecified: Secondary | ICD-10-CM | POA: Diagnosis not present

## 2017-03-12 NOTE — Telephone Encounter (Signed)
This is really between Mr Taborda and the nephrology group.

## 2017-03-15 DIAGNOSIS — N2581 Secondary hyperparathyroidism of renal origin: Secondary | ICD-10-CM | POA: Diagnosis not present

## 2017-03-15 DIAGNOSIS — N186 End stage renal disease: Secondary | ICD-10-CM | POA: Diagnosis not present

## 2017-03-17 ENCOUNTER — Other Ambulatory Visit: Payer: Self-pay | Admitting: *Deleted

## 2017-03-17 NOTE — Patient Outreach (Signed)
[facility]) Care Management  03/17/2017  Jeffrey [name] Sep 08, 1956 [id number]  Telephone Screen  Referral Date:03/11/17 Referral Source:EMMI Prevent Referral Reason: Dismissed from his provider due to personality/conflict issues Insurance:Medicare   Outreach attempt # 1 spoke to patient about questionnaire with Web designer. HIPAA verified with patient.  Social:  Patient lives with his brother. He is independent with ADLs. He is independent/assist with transportation to medical appointments.   Conditions: Past Medical Hx: ESRD w/ Home HD, Hepatitis B, HIV, Seizures, Asthma, Arthritis, HLD, HTN, Hyperthyroidism, PAD Patient voiced, he had several complicated issues, which prevented him from completing his full HD treatment at home. He verbalized having "complications from low blood pressures and heart problems". Patient spoke of having an Aortic Valve replaced in the near future. He plans to have an MRI of his heart on 03/30/17 to determine the extent of his heart function. He reported he had a fall with injury related to a blood infection. Patient stated, his Medicaid was terminated. He was told that he couldn't apply for Medicaid anymore. Patient is asking for any assistance possible, if available.  Medications: Patient reported taking 5 meds per day. Patient reported being able to afford his medications and taking them as prescribed. He receives assistance with purchasing his medications. Patient had no questions or concerns about his meds.   Appointments: Patient is in the process of finding a nephrologist. He is scheduled to meet with Dr. Ihor Austin on 03/28/17 and Dr. Latanya Presser on 04/07/17.   Advanced Directives: Pt reported having an Advanced Directive. He verbalized having a living will and HPOA.   Consent: [facility] services reviewed and discussed with patient. Verbal consent for services given.   Plan: RN CM will send [facility] SW referral for possible assistance  with community resources. RN CM advised patient to contact RN CM for any needs or concerns. RN CM provided patient with [facility] 24hr Nurse Line contact info.  Lake Bells, RN, BSN, MHA/MSL, [place] Telephonic Care Manager Coordinator [facility] Direct Phone: [phone] Toll Free: [phone] Fax: [phone]

## 2017-03-20 DIAGNOSIS — I120 Hypertensive chronic kidney disease with stage 5 chronic kidney disease or end stage renal disease: Secondary | ICD-10-CM | POA: Diagnosis not present

## 2017-03-20 DIAGNOSIS — N186 End stage renal disease: Secondary | ICD-10-CM | POA: Diagnosis not present

## 2017-03-20 DIAGNOSIS — Z992 Dependence on renal dialysis: Secondary | ICD-10-CM | POA: Diagnosis not present

## 2017-03-22 ENCOUNTER — Other Ambulatory Visit: Payer: Self-pay | Admitting: *Deleted

## 2017-03-22 ENCOUNTER — Encounter: Payer: Self-pay | Admitting: *Deleted

## 2017-03-22 NOTE — Patient Outreach (Signed)
[facility]) Care Management  03/22/2017  Jeffrey [name] April 09, 1956 [id number]   CSW received referral for assistance for Medicaid and Nephrologist. This  CSW made an initial attempt to try and contact patient today to perform phone assessment, as well as assess and assist with social needs and services, without success.  A HIPAA compliant message was left for patient on voicemail. This covering CSW has updated [employer], LCSW, assigned CSW for follow up upon her return.   Eduard Clos, MSW, [place] Worker  [place] [phone]

## 2017-03-23 ENCOUNTER — Ambulatory Visit: Payer: Medicare Other | Admitting: *Deleted

## 2017-03-25 ENCOUNTER — Other Ambulatory Visit: Payer: Self-pay | Admitting: *Deleted

## 2017-03-25 NOTE — Patient Outreach (Signed)
[facility]) Care Management  03/25/2017  Jeffrey [name] [birth year] [id number]  CSW contacted patient and was able to make initial contact. CSW confirmed his identity and introduced self and CSW role. Patient reports he was "dismissed" by his Nephrologist because of a disagreement about RX's. He has an appointment with a potential new Nephrologist on 04/07/17 and is hopeful to be accepted into his practice. He also reports he has been doing home HD dialysis but is going on Saturday to the [facility] HD clinic.   CSW will plan f/u call on 7/19 for update on his progress with the above.   Eduard Clos, MSW, [place] Worker  [place] [phone]

## 2017-03-27 DIAGNOSIS — D509 Iron deficiency anemia, unspecified: Secondary | ICD-10-CM | POA: Diagnosis not present

## 2017-03-27 DIAGNOSIS — N2581 Secondary hyperparathyroidism of renal origin: Secondary | ICD-10-CM | POA: Diagnosis not present

## 2017-03-27 DIAGNOSIS — D631 Anemia in chronic kidney disease: Secondary | ICD-10-CM | POA: Diagnosis not present

## 2017-03-27 DIAGNOSIS — N186 End stage renal disease: Secondary | ICD-10-CM | POA: Diagnosis not present

## 2017-03-30 ENCOUNTER — Encounter: Payer: Self-pay | Admitting: Thoracic Surgery (Cardiothoracic Vascular Surgery)

## 2017-03-30 ENCOUNTER — Ambulatory Visit
Admission: RE | Admit: 2017-03-30 | Discharge: 2017-03-30 | Disposition: A | Discharge status: Home / Self Care | Payer: Medicare Other | Source: Ambulatory Visit | Attending: Thoracic Surgery (Cardiothoracic Vascular Surgery) | Admitting: Thoracic Surgery (Cardiothoracic Vascular Surgery)

## 2017-03-30 ENCOUNTER — Ambulatory Visit (INDEPENDENT_AMBULATORY_CARE_PROVIDER_SITE_OTHER): Payer: Medicare Other | Admitting: Thoracic Surgery (Cardiothoracic Vascular Surgery)

## 2017-03-30 VITALS — BP 119/66 | HR 85 | Resp 20 | Ht 72.0 in | Wt 180.0 lb

## 2017-03-30 DIAGNOSIS — I351 Nonrheumatic aortic (valve) insufficiency: Secondary | ICD-10-CM

## 2017-03-30 DIAGNOSIS — N186 End stage renal disease: Secondary | ICD-10-CM | POA: Diagnosis not present

## 2017-03-30 DIAGNOSIS — I7789 Other specified disorders of arteries and arterioles: Secondary | ICD-10-CM

## 2017-03-30 DIAGNOSIS — R918 Other nonspecific abnormal finding of lung field: Secondary | ICD-10-CM | POA: Diagnosis not present

## 2017-03-30 DIAGNOSIS — D631 Anemia in chronic kidney disease: Secondary | ICD-10-CM | POA: Diagnosis not present

## 2017-03-30 DIAGNOSIS — D509 Iron deficiency anemia, unspecified: Secondary | ICD-10-CM | POA: Diagnosis not present

## 2017-03-30 DIAGNOSIS — N2581 Secondary hyperparathyroidism of renal origin: Secondary | ICD-10-CM | POA: Diagnosis not present

## 2017-03-30 NOTE — Progress Notes (Signed)
[address]       [address],[place] [address]             [id number]       HPI: [name] returns to discuss possible surgical management of his aortic insufficiency  [name] is a 61 year old man with past history of end-stage renal disease on hemodialysis since 2012, aortic valve endocarditis, hepatitis B, HIV, seizures, asthma, arthritis, anemia of chronic disease, hypertension, hyperthyroidism, thrombocytopenia, and peripheral arterial disease. He was hospitalized in November with low back pain. He had methicillin sensitive staph aureus bacteremia and was treated with IV Ancef for 2 weeks. An echocardiogram at that time showed moderate aortic insufficiency (pressure halftime 340 ms). He was readmitted in December with shortness of breath and worsening anemia. A transesophageal echocardiogram showed severe aortic insufficiency (pressure halftime 74 ms). There was no definite vegetation. LV function was preserved. He improved with medical treatment.  In February was referred for consideration for aortic valve replacement. I recommended based on the TEE findings. He was uncertain as to whether he wanted to proceed. A saw him in the office again in June and he had decided he wanted to proceed with aortic valve replacement. Given the long interval of time since his TEE I recommended that we repeat a transthoracic echo to make sure there weren't any other valvular issues. He now returns to discuss the results of that study.  He did have cardiac catheterization which showed no significant coronary disease.  He feels like he has a cold. He's had some congestion and cough. He denies fevers, chills, or sweats. He denies shortness of breath, orthopnea, and peripheral edema. He has not had any problems with dialysis.  Past Medical History:  Diagnosis Date  . Anemia   . Anxiety   . Arthritis   . Asthma    per pt hx  . End stage renal disease on home HD 07/10/2011   Started HD in  September 2012 at [facility] with a tunneled HD catheter, now on home HD with NxtStage. Dialyzing through AVF L lower arm with buttonhole technique as of mid 2014. His brother does the HD treatments at home.  They are roommates for 23 years.  The brother works 3rd shift and gets off about 8am and then puts Mr Hansen on HD in the morning after getting home. Most of the time he does HD about 4 times a week, for about 4 hours per treatment. Cause of ESRD was HTN according to patient. He says he let his health go and ending up with complications, and that he didn't like seeing doctors in those days.  He says he was diagnosed with severe HTN when he lived in [place] in his 50's.   . Gout   . Hepatitis B carrier ([place])   . HIV infection ([place])   . Hypertension   . Hyperthyroidism   . Pneumonia   . Seizure ([place])   . Seizures ([place]) 06/02/2011   pt denies this  . Thrombocytopenia ([place])     Current Outpatient Prescriptions  Medication Sig Dispense Refill  . albuterol (PROVENTIL) (2.5 MG/3ML) 0.083% nebulizer solution Take 2.5 mg by nebulization every 6 (six) hours as needed for wheezing or shortness of breath.    . ALPRAZolam (XANAX) 0.25 MG tablet Take 0.25 mg by mouth daily as needed for anxiety.    [place] Kitchen amLODipine (NORVASC) 10 MG tablet Take 5 mg by mouth 2 (two) times daily.     [place] Kitchen  calcium acetate (PHOSLO) 667 MG capsule Take 667-2,001 mg by mouth See admin instructions. 1,334-2,001 mg three times a day with each meal and 667-1,334 mg with each snack    . carvedilol (COREG) 25 MG tablet Take 25 mg by mouth 2 (two) times daily with a meal.    . cloNIDine (CATAPRES - DOSED IN MG/24 HR) 0.1 mg/24hr patch UNWRAP AND APPLY 1 PATCH TO SKIN EVERY 7 DAYS 4 patch 3  . COMBIVENT RESPIMAT 20-100 MCG/ACT AERS respimat INHALE 1 PUFF BY MOUTH EVERY 4 HOURS AS NEEDED FOR WHEEZING OR SHORTNESS OF BREATH 4 g 0  . doxazosin (CARDURA) 2 MG tablet Take 4 mg by mouth daily.  11  . EPIVIR HBV 5 MG/ML solution TAKE 5 ML BY MOUTH  EVERY DAY 240 mL 11  . ethyl chloride spray Apply 1 application topically daily as needed. At HD  12  . hydrALAZINE (APRESOLINE) 100 MG tablet Take 100 mg by mouth 3 (three) times daily.     . ISENTRESS 400 MG tablet TAKE 1 TABLET(400 MG) BY MOUTH TWICE DAILY 60 tablet 6  . multivitamin (RENA-VIT) TABS tablet Take 1 tablet by mouth daily.      [place] Kitchen tenofovir (VIREAD) 300 MG tablet TAKE 1 TABLET BY MOUTH EVERY SATURDAY AFTER DIALYSIS 5 tablet 5  . traZODone (DESYREL) 50 MG tablet Take 50 mg by mouth at bedtime as needed for sleep.     No current facility-administered medications for this visit.     Physical Exam BP 119/66   Pulse 85   Resp 20   Ht 6' (1.829 m)   Wt 180 lb (81.6 kg)   SpO2 95% Comment: RA  BMI 24.37 kg/m  61 year old male in no acute distress Alert and oriented 3 with no focal deficits No JVD Cardiac regular rate and rhythm with a faint diastolic murmur Lungs clear with equal breath sounds bilaterally, no rales No peripheral edema  Diagnostic Tests: Study Conclusions  - Left ventricle: The cavity size was normal. Wall thickness was   increased in a pattern of severe LVH. Systolic function was   vigorous. The estimated ejection fraction was in the range of 65%   to 70%. Wall motion was normal; there were no regional wall   motion abnormalities. Doppler parameters are consistent with   abnormal left ventricular relaxation (grade 1 diastolic   dysfunction). - Aortic valve: There was moderate regurgitation. I personally reviewed the echocardiogram images and concur with the findings noted above.  Impression: [name] is a 61 year old man with multiple medical problems including end-stage renal disease requiring hemodialysis. In November 2017 he had MSSA bacteremia treated with 2 weeks of intravenous Ancef. An echocardiogram at that time showed moderate aortic insufficiency, but no definite evidence of vegetation. He presented back in December and a TEE showed  severe aortic insufficiency by pressure half-time although the jet did not appear significantly different on the Doppler images. He improved with medical management and later was referred for surgery. He delayed consideration for surgery for several months, so I repeated an echocardiogram, mainly to ensure that there were no other considerations prior to aortic valve replacement. Surprisingly, the aortic insufficiency was only moderate. He is not having any signs or symptoms suggestive of heart failure. I do not feel there is sufficient indication for aortic valve replacement at the present time.  I discussed these issues with Mr. Hevia and his brother. I emphasized the importance of sterile technique and taking care of any suspected bacterial  infections immediately.  Plan: Follow-up with cardiology.  I will be happy to see Mr. Caniglia back any time if I can be of any further assistance with his care.  [name], MD [facility] [phone]

## 2017-04-01 DIAGNOSIS — D509 Iron deficiency anemia, unspecified: Secondary | ICD-10-CM | POA: Diagnosis not present

## 2017-04-01 DIAGNOSIS — D631 Anemia in chronic kidney disease: Secondary | ICD-10-CM | POA: Diagnosis not present

## 2017-04-01 DIAGNOSIS — N186 End stage renal disease: Secondary | ICD-10-CM | POA: Diagnosis not present

## 2017-04-01 DIAGNOSIS — N2581 Secondary hyperparathyroidism of renal origin: Secondary | ICD-10-CM | POA: Diagnosis not present

## 2017-04-05 DIAGNOSIS — D509 Iron deficiency anemia, unspecified: Secondary | ICD-10-CM | POA: Diagnosis not present

## 2017-04-05 DIAGNOSIS — D631 Anemia in chronic kidney disease: Secondary | ICD-10-CM | POA: Diagnosis not present

## 2017-04-05 DIAGNOSIS — N2581 Secondary hyperparathyroidism of renal origin: Secondary | ICD-10-CM | POA: Diagnosis not present

## 2017-04-05 DIAGNOSIS — N186 End stage renal disease: Secondary | ICD-10-CM | POA: Diagnosis not present

## 2017-04-06 DIAGNOSIS — D631 Anemia in chronic kidney disease: Secondary | ICD-10-CM | POA: Diagnosis not present

## 2017-04-06 DIAGNOSIS — N2581 Secondary hyperparathyroidism of renal origin: Secondary | ICD-10-CM | POA: Diagnosis not present

## 2017-04-06 DIAGNOSIS — D509 Iron deficiency anemia, unspecified: Secondary | ICD-10-CM | POA: Diagnosis not present

## 2017-04-06 DIAGNOSIS — N186 End stage renal disease: Secondary | ICD-10-CM | POA: Diagnosis not present

## 2017-04-07 DIAGNOSIS — N2581 Secondary hyperparathyroidism of renal origin: Secondary | ICD-10-CM | POA: Diagnosis not present

## 2017-04-07 DIAGNOSIS — N186 End stage renal disease: Secondary | ICD-10-CM | POA: Diagnosis not present

## 2017-04-07 DIAGNOSIS — D631 Anemia in chronic kidney disease: Secondary | ICD-10-CM | POA: Diagnosis not present

## 2017-04-07 DIAGNOSIS — I10 Essential (primary) hypertension: Secondary | ICD-10-CM | POA: Diagnosis not present

## 2017-04-08 ENCOUNTER — Encounter: Payer: Self-pay | Admitting: *Deleted

## 2017-04-08 ENCOUNTER — Other Ambulatory Visit: Payer: Self-pay | Admitting: *Deleted

## 2017-04-08 DIAGNOSIS — D509 Iron deficiency anemia, unspecified: Secondary | ICD-10-CM | POA: Diagnosis not present

## 2017-04-08 DIAGNOSIS — D631 Anemia in chronic kidney disease: Secondary | ICD-10-CM | POA: Diagnosis not present

## 2017-04-08 DIAGNOSIS — N2581 Secondary hyperparathyroidism of renal origin: Secondary | ICD-10-CM | POA: Diagnosis not present

## 2017-04-08 DIAGNOSIS — N186 End stage renal disease: Secondary | ICD-10-CM | POA: Diagnosis not present

## 2017-04-08 NOTE — Patient Outreach (Signed)
[facility]) Care Management  04/08/2017  Jeffrey [name] 07-19-1956 [id number]   CSW spoke with patient by phone today. He reports Dr Forestine Chute has accepted him as a Nephrology patient and he is currently at the Dialysis Center. Patient denies any further needs or concerns. He declines enrollment at this time. CSW encouraged him to seek assistance from the Dialysis Center Social Worker as well as to contact us if other needs arise.  Patient appreciative of assistance.  CSW will close referral and advise [facility] team and PCP. CSW will send case closure letter to patient as well as to PCP.   Eduard Clos, MSW, [place] Worker  [place] [phone]

## 2017-04-10 ENCOUNTER — Other Ambulatory Visit: Payer: Self-pay | Admitting: Infectious Diseases

## 2017-04-12 DIAGNOSIS — N2581 Secondary hyperparathyroidism of renal origin: Secondary | ICD-10-CM | POA: Diagnosis not present

## 2017-04-12 DIAGNOSIS — N186 End stage renal disease: Secondary | ICD-10-CM | POA: Diagnosis not present

## 2017-04-12 DIAGNOSIS — D509 Iron deficiency anemia, unspecified: Secondary | ICD-10-CM | POA: Diagnosis not present

## 2017-04-12 DIAGNOSIS — D631 Anemia in chronic kidney disease: Secondary | ICD-10-CM | POA: Diagnosis not present

## 2017-04-13 DIAGNOSIS — N186 End stage renal disease: Secondary | ICD-10-CM | POA: Diagnosis not present

## 2017-04-13 DIAGNOSIS — D509 Iron deficiency anemia, unspecified: Secondary | ICD-10-CM | POA: Diagnosis not present

## 2017-04-13 DIAGNOSIS — N2581 Secondary hyperparathyroidism of renal origin: Secondary | ICD-10-CM | POA: Diagnosis not present

## 2017-04-13 DIAGNOSIS — D631 Anemia in chronic kidney disease: Secondary | ICD-10-CM | POA: Diagnosis not present

## 2017-04-15 DIAGNOSIS — D631 Anemia in chronic kidney disease: Secondary | ICD-10-CM | POA: Diagnosis not present

## 2017-04-15 DIAGNOSIS — D509 Iron deficiency anemia, unspecified: Secondary | ICD-10-CM | POA: Diagnosis not present

## 2017-04-15 DIAGNOSIS — N2581 Secondary hyperparathyroidism of renal origin: Secondary | ICD-10-CM | POA: Diagnosis not present

## 2017-04-15 DIAGNOSIS — N186 End stage renal disease: Secondary | ICD-10-CM | POA: Diagnosis not present

## 2017-04-17 DIAGNOSIS — D631 Anemia in chronic kidney disease: Secondary | ICD-10-CM | POA: Diagnosis not present

## 2017-04-17 DIAGNOSIS — N2581 Secondary hyperparathyroidism of renal origin: Secondary | ICD-10-CM | POA: Diagnosis not present

## 2017-04-17 DIAGNOSIS — D509 Iron deficiency anemia, unspecified: Secondary | ICD-10-CM | POA: Diagnosis not present

## 2017-04-17 DIAGNOSIS — N186 End stage renal disease: Secondary | ICD-10-CM | POA: Diagnosis not present

## 2017-04-20 DIAGNOSIS — D509 Iron deficiency anemia, unspecified: Secondary | ICD-10-CM | POA: Diagnosis not present

## 2017-04-20 DIAGNOSIS — Z992 Dependence on renal dialysis: Secondary | ICD-10-CM | POA: Diagnosis not present

## 2017-04-20 DIAGNOSIS — I120 Hypertensive chronic kidney disease with stage 5 chronic kidney disease or end stage renal disease: Secondary | ICD-10-CM | POA: Diagnosis not present

## 2017-04-20 DIAGNOSIS — D631 Anemia in chronic kidney disease: Secondary | ICD-10-CM | POA: Diagnosis not present

## 2017-04-20 DIAGNOSIS — N186 End stage renal disease: Secondary | ICD-10-CM | POA: Diagnosis not present

## 2017-04-20 DIAGNOSIS — N2581 Secondary hyperparathyroidism of renal origin: Secondary | ICD-10-CM | POA: Diagnosis not present

## 2017-04-22 DIAGNOSIS — D509 Iron deficiency anemia, unspecified: Secondary | ICD-10-CM | POA: Diagnosis not present

## 2017-04-22 DIAGNOSIS — N186 End stage renal disease: Secondary | ICD-10-CM | POA: Diagnosis not present

## 2017-04-22 DIAGNOSIS — D631 Anemia in chronic kidney disease: Secondary | ICD-10-CM | POA: Diagnosis not present

## 2017-04-22 DIAGNOSIS — N2581 Secondary hyperparathyroidism of renal origin: Secondary | ICD-10-CM | POA: Diagnosis not present

## 2017-04-24 DIAGNOSIS — N2581 Secondary hyperparathyroidism of renal origin: Secondary | ICD-10-CM | POA: Diagnosis not present

## 2017-04-24 DIAGNOSIS — D509 Iron deficiency anemia, unspecified: Secondary | ICD-10-CM | POA: Diagnosis not present

## 2017-04-24 DIAGNOSIS — D631 Anemia in chronic kidney disease: Secondary | ICD-10-CM | POA: Diagnosis not present

## 2017-04-24 DIAGNOSIS — N186 End stage renal disease: Secondary | ICD-10-CM | POA: Diagnosis not present

## 2017-04-27 DIAGNOSIS — D509 Iron deficiency anemia, unspecified: Secondary | ICD-10-CM | POA: Diagnosis not present

## 2017-04-27 DIAGNOSIS — N2581 Secondary hyperparathyroidism of renal origin: Secondary | ICD-10-CM | POA: Diagnosis not present

## 2017-04-27 DIAGNOSIS — D631 Anemia in chronic kidney disease: Secondary | ICD-10-CM | POA: Diagnosis not present

## 2017-04-27 DIAGNOSIS — N186 End stage renal disease: Secondary | ICD-10-CM | POA: Diagnosis not present

## 2017-04-29 DIAGNOSIS — N186 End stage renal disease: Secondary | ICD-10-CM | POA: Diagnosis not present

## 2017-04-29 DIAGNOSIS — D509 Iron deficiency anemia, unspecified: Secondary | ICD-10-CM | POA: Diagnosis not present

## 2017-04-29 DIAGNOSIS — N2581 Secondary hyperparathyroidism of renal origin: Secondary | ICD-10-CM | POA: Diagnosis not present

## 2017-04-29 DIAGNOSIS — D631 Anemia in chronic kidney disease: Secondary | ICD-10-CM | POA: Diagnosis not present

## 2017-05-01 ENCOUNTER — Other Ambulatory Visit: Payer: Self-pay | Admitting: Infectious Diseases

## 2017-05-01 DIAGNOSIS — B20 Human immunodeficiency virus [HIV] disease: Secondary | ICD-10-CM

## 2017-05-03 ENCOUNTER — Other Ambulatory Visit: Payer: Self-pay | Admitting: Infectious Diseases

## 2017-05-03 DIAGNOSIS — B20 Human immunodeficiency virus [HIV] disease: Secondary | ICD-10-CM

## 2017-05-04 DIAGNOSIS — D631 Anemia in chronic kidney disease: Secondary | ICD-10-CM | POA: Diagnosis not present

## 2017-05-04 DIAGNOSIS — D509 Iron deficiency anemia, unspecified: Secondary | ICD-10-CM | POA: Diagnosis not present

## 2017-05-04 DIAGNOSIS — N186 End stage renal disease: Secondary | ICD-10-CM | POA: Diagnosis not present

## 2017-05-04 DIAGNOSIS — N2581 Secondary hyperparathyroidism of renal origin: Secondary | ICD-10-CM | POA: Diagnosis not present

## 2017-05-06 DIAGNOSIS — N186 End stage renal disease: Secondary | ICD-10-CM | POA: Diagnosis not present

## 2017-05-06 DIAGNOSIS — N2581 Secondary hyperparathyroidism of renal origin: Secondary | ICD-10-CM | POA: Diagnosis not present

## 2017-05-06 DIAGNOSIS — D631 Anemia in chronic kidney disease: Secondary | ICD-10-CM | POA: Diagnosis not present

## 2017-05-06 DIAGNOSIS — D509 Iron deficiency anemia, unspecified: Secondary | ICD-10-CM | POA: Diagnosis not present

## 2017-05-08 ENCOUNTER — Other Ambulatory Visit: Payer: Self-pay | Admitting: Infectious Diseases

## 2017-05-08 DIAGNOSIS — N2581 Secondary hyperparathyroidism of renal origin: Secondary | ICD-10-CM | POA: Diagnosis not present

## 2017-05-08 DIAGNOSIS — N186 End stage renal disease: Secondary | ICD-10-CM | POA: Diagnosis not present

## 2017-05-08 DIAGNOSIS — D509 Iron deficiency anemia, unspecified: Secondary | ICD-10-CM | POA: Diagnosis not present

## 2017-05-08 DIAGNOSIS — D631 Anemia in chronic kidney disease: Secondary | ICD-10-CM | POA: Diagnosis not present

## 2017-05-11 DIAGNOSIS — D631 Anemia in chronic kidney disease: Secondary | ICD-10-CM | POA: Diagnosis not present

## 2017-05-11 DIAGNOSIS — N2581 Secondary hyperparathyroidism of renal origin: Secondary | ICD-10-CM | POA: Diagnosis not present

## 2017-05-11 DIAGNOSIS — N186 End stage renal disease: Secondary | ICD-10-CM | POA: Diagnosis not present

## 2017-05-11 DIAGNOSIS — D509 Iron deficiency anemia, unspecified: Secondary | ICD-10-CM | POA: Diagnosis not present

## 2017-05-13 DIAGNOSIS — D631 Anemia in chronic kidney disease: Secondary | ICD-10-CM | POA: Diagnosis not present

## 2017-05-13 DIAGNOSIS — N186 End stage renal disease: Secondary | ICD-10-CM | POA: Diagnosis not present

## 2017-05-13 DIAGNOSIS — N2581 Secondary hyperparathyroidism of renal origin: Secondary | ICD-10-CM | POA: Diagnosis not present

## 2017-05-13 DIAGNOSIS — D509 Iron deficiency anemia, unspecified: Secondary | ICD-10-CM | POA: Diagnosis not present

## 2017-05-15 DIAGNOSIS — D509 Iron deficiency anemia, unspecified: Secondary | ICD-10-CM | POA: Diagnosis not present

## 2017-05-15 DIAGNOSIS — D631 Anemia in chronic kidney disease: Secondary | ICD-10-CM | POA: Diagnosis not present

## 2017-05-15 DIAGNOSIS — N2581 Secondary hyperparathyroidism of renal origin: Secondary | ICD-10-CM | POA: Diagnosis not present

## 2017-05-15 DIAGNOSIS — N186 End stage renal disease: Secondary | ICD-10-CM | POA: Diagnosis not present

## 2017-05-17 DIAGNOSIS — N186 End stage renal disease: Secondary | ICD-10-CM | POA: Diagnosis not present

## 2017-05-17 DIAGNOSIS — D631 Anemia in chronic kidney disease: Secondary | ICD-10-CM | POA: Diagnosis not present

## 2017-05-17 DIAGNOSIS — I509 Heart failure, unspecified: Secondary | ICD-10-CM | POA: Diagnosis not present

## 2017-05-17 DIAGNOSIS — I120 Hypertensive chronic kidney disease with stage 5 chronic kidney disease or end stage renal disease: Secondary | ICD-10-CM | POA: Diagnosis not present

## 2017-05-17 DIAGNOSIS — Z992 Dependence on renal dialysis: Secondary | ICD-10-CM | POA: Diagnosis not present

## 2017-05-17 DIAGNOSIS — N2581 Secondary hyperparathyroidism of renal origin: Secondary | ICD-10-CM | POA: Diagnosis not present

## 2017-05-18 DIAGNOSIS — Z992 Dependence on renal dialysis: Secondary | ICD-10-CM | POA: Diagnosis not present

## 2017-05-18 DIAGNOSIS — N2581 Secondary hyperparathyroidism of renal origin: Secondary | ICD-10-CM | POA: Diagnosis not present

## 2017-05-18 DIAGNOSIS — I509 Heart failure, unspecified: Secondary | ICD-10-CM | POA: Diagnosis not present

## 2017-05-18 DIAGNOSIS — I120 Hypertensive chronic kidney disease with stage 5 chronic kidney disease or end stage renal disease: Secondary | ICD-10-CM | POA: Diagnosis not present

## 2017-05-18 DIAGNOSIS — N186 End stage renal disease: Secondary | ICD-10-CM | POA: Diagnosis not present

## 2017-05-18 DIAGNOSIS — D631 Anemia in chronic kidney disease: Secondary | ICD-10-CM | POA: Diagnosis not present

## 2017-05-18 MED FILL — ETHYL CHLORIDE SPRAY: 25 days supply | Qty: 104 | Fill #3

## 2017-05-19 DIAGNOSIS — I509 Heart failure, unspecified: Secondary | ICD-10-CM | POA: Diagnosis not present

## 2017-05-19 DIAGNOSIS — D631 Anemia in chronic kidney disease: Secondary | ICD-10-CM | POA: Diagnosis not present

## 2017-05-19 DIAGNOSIS — N2581 Secondary hyperparathyroidism of renal origin: Secondary | ICD-10-CM | POA: Diagnosis not present

## 2017-05-19 DIAGNOSIS — I120 Hypertensive chronic kidney disease with stage 5 chronic kidney disease or end stage renal disease: Secondary | ICD-10-CM | POA: Diagnosis not present

## 2017-05-19 DIAGNOSIS — Z992 Dependence on renal dialysis: Secondary | ICD-10-CM | POA: Diagnosis not present

## 2017-05-19 DIAGNOSIS — N186 End stage renal disease: Secondary | ICD-10-CM | POA: Diagnosis not present

## 2017-05-21 DIAGNOSIS — N186 End stage renal disease: Secondary | ICD-10-CM | POA: Diagnosis not present

## 2017-05-21 DIAGNOSIS — D631 Anemia in chronic kidney disease: Secondary | ICD-10-CM | POA: Diagnosis not present

## 2017-05-21 DIAGNOSIS — N2581 Secondary hyperparathyroidism of renal origin: Secondary | ICD-10-CM | POA: Diagnosis not present

## 2017-05-21 DIAGNOSIS — I120 Hypertensive chronic kidney disease with stage 5 chronic kidney disease or end stage renal disease: Secondary | ICD-10-CM | POA: Diagnosis not present

## 2017-05-21 DIAGNOSIS — Z992 Dependence on renal dialysis: Secondary | ICD-10-CM | POA: Diagnosis not present

## 2017-05-21 DIAGNOSIS — I509 Heart failure, unspecified: Secondary | ICD-10-CM | POA: Diagnosis not present

## 2017-05-23 DIAGNOSIS — I509 Heart failure, unspecified: Secondary | ICD-10-CM | POA: Diagnosis not present

## 2017-05-23 DIAGNOSIS — Z992 Dependence on renal dialysis: Secondary | ICD-10-CM | POA: Diagnosis not present

## 2017-05-23 DIAGNOSIS — N186 End stage renal disease: Secondary | ICD-10-CM | POA: Diagnosis not present

## 2017-05-23 DIAGNOSIS — I120 Hypertensive chronic kidney disease with stage 5 chronic kidney disease or end stage renal disease: Secondary | ICD-10-CM | POA: Diagnosis not present

## 2017-05-25 DIAGNOSIS — Z992 Dependence on renal dialysis: Secondary | ICD-10-CM | POA: Diagnosis not present

## 2017-05-25 DIAGNOSIS — D509 Iron deficiency anemia, unspecified: Secondary | ICD-10-CM | POA: Diagnosis not present

## 2017-05-25 DIAGNOSIS — I509 Heart failure, unspecified: Secondary | ICD-10-CM | POA: Diagnosis not present

## 2017-05-25 DIAGNOSIS — N186 End stage renal disease: Secondary | ICD-10-CM | POA: Diagnosis not present

## 2017-05-25 DIAGNOSIS — I120 Hypertensive chronic kidney disease with stage 5 chronic kidney disease or end stage renal disease: Secondary | ICD-10-CM | POA: Diagnosis not present

## 2017-05-25 DIAGNOSIS — D631 Anemia in chronic kidney disease: Secondary | ICD-10-CM | POA: Diagnosis not present

## 2017-05-27 DIAGNOSIS — I509 Heart failure, unspecified: Secondary | ICD-10-CM | POA: Diagnosis not present

## 2017-05-27 DIAGNOSIS — I120 Hypertensive chronic kidney disease with stage 5 chronic kidney disease or end stage renal disease: Secondary | ICD-10-CM | POA: Diagnosis not present

## 2017-05-27 DIAGNOSIS — Z992 Dependence on renal dialysis: Secondary | ICD-10-CM | POA: Diagnosis not present

## 2017-05-27 DIAGNOSIS — D509 Iron deficiency anemia, unspecified: Secondary | ICD-10-CM | POA: Diagnosis not present

## 2017-05-27 DIAGNOSIS — N186 End stage renal disease: Secondary | ICD-10-CM | POA: Diagnosis not present

## 2017-05-27 DIAGNOSIS — D631 Anemia in chronic kidney disease: Secondary | ICD-10-CM | POA: Diagnosis not present

## 2017-05-28 DIAGNOSIS — I120 Hypertensive chronic kidney disease with stage 5 chronic kidney disease or end stage renal disease: Secondary | ICD-10-CM | POA: Diagnosis not present

## 2017-05-28 DIAGNOSIS — N186 End stage renal disease: Secondary | ICD-10-CM | POA: Diagnosis not present

## 2017-05-28 DIAGNOSIS — D509 Iron deficiency anemia, unspecified: Secondary | ICD-10-CM | POA: Diagnosis not present

## 2017-05-28 DIAGNOSIS — Z992 Dependence on renal dialysis: Secondary | ICD-10-CM | POA: Diagnosis not present

## 2017-05-28 DIAGNOSIS — I509 Heart failure, unspecified: Secondary | ICD-10-CM | POA: Diagnosis not present

## 2017-05-28 DIAGNOSIS — D631 Anemia in chronic kidney disease: Secondary | ICD-10-CM | POA: Diagnosis not present

## 2017-05-31 DIAGNOSIS — D509 Iron deficiency anemia, unspecified: Secondary | ICD-10-CM | POA: Diagnosis not present

## 2017-05-31 DIAGNOSIS — I120 Hypertensive chronic kidney disease with stage 5 chronic kidney disease or end stage renal disease: Secondary | ICD-10-CM | POA: Diagnosis not present

## 2017-05-31 DIAGNOSIS — I509 Heart failure, unspecified: Secondary | ICD-10-CM | POA: Diagnosis not present

## 2017-05-31 DIAGNOSIS — N186 End stage renal disease: Secondary | ICD-10-CM | POA: Diagnosis not present

## 2017-05-31 DIAGNOSIS — Z992 Dependence on renal dialysis: Secondary | ICD-10-CM | POA: Diagnosis not present

## 2017-05-31 DIAGNOSIS — D631 Anemia in chronic kidney disease: Secondary | ICD-10-CM | POA: Diagnosis not present

## 2017-06-01 DIAGNOSIS — D509 Iron deficiency anemia, unspecified: Secondary | ICD-10-CM | POA: Diagnosis not present

## 2017-06-01 DIAGNOSIS — Z992 Dependence on renal dialysis: Secondary | ICD-10-CM | POA: Diagnosis not present

## 2017-06-01 DIAGNOSIS — I120 Hypertensive chronic kidney disease with stage 5 chronic kidney disease or end stage renal disease: Secondary | ICD-10-CM | POA: Diagnosis not present

## 2017-06-01 DIAGNOSIS — I509 Heart failure, unspecified: Secondary | ICD-10-CM | POA: Diagnosis not present

## 2017-06-01 DIAGNOSIS — D631 Anemia in chronic kidney disease: Secondary | ICD-10-CM | POA: Diagnosis not present

## 2017-06-01 DIAGNOSIS — N186 End stage renal disease: Secondary | ICD-10-CM | POA: Diagnosis not present

## 2017-06-03 DIAGNOSIS — Z992 Dependence on renal dialysis: Secondary | ICD-10-CM | POA: Diagnosis not present

## 2017-06-03 DIAGNOSIS — I120 Hypertensive chronic kidney disease with stage 5 chronic kidney disease or end stage renal disease: Secondary | ICD-10-CM | POA: Diagnosis not present

## 2017-06-03 DIAGNOSIS — I509 Heart failure, unspecified: Secondary | ICD-10-CM | POA: Diagnosis not present

## 2017-06-03 DIAGNOSIS — D631 Anemia in chronic kidney disease: Secondary | ICD-10-CM | POA: Diagnosis not present

## 2017-06-03 DIAGNOSIS — D509 Iron deficiency anemia, unspecified: Secondary | ICD-10-CM | POA: Diagnosis not present

## 2017-06-03 DIAGNOSIS — N186 End stage renal disease: Secondary | ICD-10-CM | POA: Diagnosis not present

## 2017-06-04 DIAGNOSIS — D631 Anemia in chronic kidney disease: Secondary | ICD-10-CM | POA: Diagnosis not present

## 2017-06-04 DIAGNOSIS — I120 Hypertensive chronic kidney disease with stage 5 chronic kidney disease or end stage renal disease: Secondary | ICD-10-CM | POA: Diagnosis not present

## 2017-06-04 DIAGNOSIS — Z992 Dependence on renal dialysis: Secondary | ICD-10-CM | POA: Diagnosis not present

## 2017-06-04 DIAGNOSIS — N186 End stage renal disease: Secondary | ICD-10-CM | POA: Diagnosis not present

## 2017-06-04 DIAGNOSIS — I509 Heart failure, unspecified: Secondary | ICD-10-CM | POA: Diagnosis not present

## 2017-06-04 DIAGNOSIS — D509 Iron deficiency anemia, unspecified: Secondary | ICD-10-CM | POA: Diagnosis not present

## 2017-06-07 DIAGNOSIS — D631 Anemia in chronic kidney disease: Secondary | ICD-10-CM | POA: Diagnosis not present

## 2017-06-07 DIAGNOSIS — I120 Hypertensive chronic kidney disease with stage 5 chronic kidney disease or end stage renal disease: Secondary | ICD-10-CM | POA: Diagnosis not present

## 2017-06-07 DIAGNOSIS — I509 Heart failure, unspecified: Secondary | ICD-10-CM | POA: Diagnosis not present

## 2017-06-07 DIAGNOSIS — Z992 Dependence on renal dialysis: Secondary | ICD-10-CM | POA: Diagnosis not present

## 2017-06-07 DIAGNOSIS — N186 End stage renal disease: Secondary | ICD-10-CM | POA: Diagnosis not present

## 2017-06-07 DIAGNOSIS — D509 Iron deficiency anemia, unspecified: Secondary | ICD-10-CM | POA: Diagnosis not present

## 2017-06-08 DIAGNOSIS — N186 End stage renal disease: Secondary | ICD-10-CM | POA: Diagnosis not present

## 2017-06-08 DIAGNOSIS — Z992 Dependence on renal dialysis: Secondary | ICD-10-CM | POA: Diagnosis not present

## 2017-06-08 DIAGNOSIS — D631 Anemia in chronic kidney disease: Secondary | ICD-10-CM | POA: Diagnosis not present

## 2017-06-08 DIAGNOSIS — I120 Hypertensive chronic kidney disease with stage 5 chronic kidney disease or end stage renal disease: Secondary | ICD-10-CM | POA: Diagnosis not present

## 2017-06-08 DIAGNOSIS — D509 Iron deficiency anemia, unspecified: Secondary | ICD-10-CM | POA: Diagnosis not present

## 2017-06-08 DIAGNOSIS — I509 Heart failure, unspecified: Secondary | ICD-10-CM | POA: Diagnosis not present

## 2017-06-10 DIAGNOSIS — Z992 Dependence on renal dialysis: Secondary | ICD-10-CM | POA: Diagnosis not present

## 2017-06-10 DIAGNOSIS — I509 Heart failure, unspecified: Secondary | ICD-10-CM | POA: Diagnosis not present

## 2017-06-10 DIAGNOSIS — D509 Iron deficiency anemia, unspecified: Secondary | ICD-10-CM | POA: Diagnosis not present

## 2017-06-10 DIAGNOSIS — I120 Hypertensive chronic kidney disease with stage 5 chronic kidney disease or end stage renal disease: Secondary | ICD-10-CM | POA: Diagnosis not present

## 2017-06-10 DIAGNOSIS — N186 End stage renal disease: Secondary | ICD-10-CM | POA: Diagnosis not present

## 2017-06-10 DIAGNOSIS — D631 Anemia in chronic kidney disease: Secondary | ICD-10-CM | POA: Diagnosis not present

## 2017-06-11 DIAGNOSIS — I120 Hypertensive chronic kidney disease with stage 5 chronic kidney disease or end stage renal disease: Secondary | ICD-10-CM | POA: Diagnosis not present

## 2017-06-11 DIAGNOSIS — Z992 Dependence on renal dialysis: Secondary | ICD-10-CM | POA: Diagnosis not present

## 2017-06-11 DIAGNOSIS — D509 Iron deficiency anemia, unspecified: Secondary | ICD-10-CM | POA: Diagnosis not present

## 2017-06-11 DIAGNOSIS — N186 End stage renal disease: Secondary | ICD-10-CM | POA: Diagnosis not present

## 2017-06-11 DIAGNOSIS — D631 Anemia in chronic kidney disease: Secondary | ICD-10-CM | POA: Diagnosis not present

## 2017-06-11 DIAGNOSIS — I509 Heart failure, unspecified: Secondary | ICD-10-CM | POA: Diagnosis not present

## 2017-06-14 DIAGNOSIS — D509 Iron deficiency anemia, unspecified: Secondary | ICD-10-CM | POA: Diagnosis not present

## 2017-06-14 DIAGNOSIS — Z992 Dependence on renal dialysis: Secondary | ICD-10-CM | POA: Diagnosis not present

## 2017-06-14 DIAGNOSIS — N186 End stage renal disease: Secondary | ICD-10-CM | POA: Diagnosis not present

## 2017-06-14 DIAGNOSIS — D631 Anemia in chronic kidney disease: Secondary | ICD-10-CM | POA: Diagnosis not present

## 2017-06-14 DIAGNOSIS — I120 Hypertensive chronic kidney disease with stage 5 chronic kidney disease or end stage renal disease: Secondary | ICD-10-CM | POA: Diagnosis not present

## 2017-06-14 DIAGNOSIS — I509 Heart failure, unspecified: Secondary | ICD-10-CM | POA: Diagnosis not present

## 2017-06-15 DIAGNOSIS — I120 Hypertensive chronic kidney disease with stage 5 chronic kidney disease or end stage renal disease: Secondary | ICD-10-CM | POA: Diagnosis not present

## 2017-06-15 DIAGNOSIS — N186 End stage renal disease: Secondary | ICD-10-CM | POA: Diagnosis not present

## 2017-06-15 DIAGNOSIS — D509 Iron deficiency anemia, unspecified: Secondary | ICD-10-CM | POA: Diagnosis not present

## 2017-06-15 DIAGNOSIS — D631 Anemia in chronic kidney disease: Secondary | ICD-10-CM | POA: Diagnosis not present

## 2017-06-15 DIAGNOSIS — I509 Heart failure, unspecified: Secondary | ICD-10-CM | POA: Diagnosis not present

## 2017-06-15 DIAGNOSIS — Z992 Dependence on renal dialysis: Secondary | ICD-10-CM | POA: Diagnosis not present

## 2017-06-16 ENCOUNTER — Ambulatory Visit (INDEPENDENT_AMBULATORY_CARE_PROVIDER_SITE_OTHER): Payer: Medicare Other | Admitting: Internal Medicine

## 2017-06-16 DIAGNOSIS — Z992 Dependence on renal dialysis: Secondary | ICD-10-CM

## 2017-06-16 DIAGNOSIS — Z23 Encounter for immunization: Secondary | ICD-10-CM | POA: Diagnosis present

## 2017-06-16 DIAGNOSIS — N186 End stage renal disease: Secondary | ICD-10-CM

## 2017-06-16 DIAGNOSIS — Z87891 Personal history of nicotine dependence: Secondary | ICD-10-CM

## 2017-06-16 DIAGNOSIS — M19019 Primary osteoarthritis, unspecified shoulder: Secondary | ICD-10-CM | POA: Insufficient documentation

## 2017-06-16 DIAGNOSIS — M25512 Pain in left shoulder: Secondary | ICD-10-CM

## 2017-06-16 MED ORDER — IBUPROFEN 200 MG PO TABS: 200.0000 mg | ORAL_TABLET | Freq: Four times a day (QID) | ORAL | 0 refills | Status: DC | PRN

## 2017-06-16 MED ORDER — DICLOFENAC SODIUM 1 % TD GEL: 2.0000 g | Freq: Four times a day (QID) | TRANSDERMAL | 1 refills | Status: DC

## 2017-06-16 NOTE — Progress Notes (Signed)
   CC: left shoulder pain  HPI:  Jeffrey.Jeffrey [name] is a 61 y.o. M with ESRD on home HD who presents with 2 week history of left shoulder pain which he noticed when he woke up one morning. Denies any trauma to the area or history of prior similar episodes. He denies any weakness, numbness, tingling, neck or back pain. He has tried several OTC topical products including Icyhot, Bengay, Aspercreme and heating pads. Finds the most relief with heating pads. He has also been taking some tylenol and ibuprofen as well.   Past Medical History:  Diagnosis Date  . Anemia   . Anxiety   . Arthritis   . Asthma    per pt hx  . End stage renal disease on home HD 07/10/2011   Started HD in September 2012 at [facility] with a tunneled HD catheter, now on home HD with NxtStage. Dialyzing through AVF L lower arm with buttonhole technique as of mid 2014. His brother does the HD treatments at home.  They are roommates for 23 years.  The brother works 3rd shift and gets off about 8am and then puts Jeffrey [name] on HD in the morning after getting home. Most of the time he does HD about 4 times a week, for about 4 hours per treatment. Cause of ESRD was HTN according to patient. He says he let his health go and ending up with complications, and that he didn't like seeing doctors in those days.  He says he was diagnosed with severe HTN when he lived in [place] in his 21's.   . Gout   . Hepatitis B carrier ([place])   . HIV infection ([place])   . Hypertension   . Hyperthyroidism   . Pneumonia   . Seizure ([place])   . Seizures ([place]) 06/02/2011   pt denies this  . Thrombocytopenia ([place])    Review of Systems:   General: Denies fevers, chills, weight loss, fatigue HEENT: Denies changes in vision, sore throat, dysphagia. Denies neck pain. Cardiac: Denies CP, SOB, palpitations Pulmonary: Denies cough, wheezes, PND Extremities: Denies weakness or swelling. No numbness or tingling.  Physical Exam: General: Alert, in no acute distress.  Pleasant and conversant. Brother present as well.  Cardiac: RRR, no MGR appreciated Pulmonary: CTA BL with normal WOB on RA. Able to speak in complete sentences Abd: Soft, non-tended. +bs Extremities: Warm, perfused. No significant pedal edema.  Left shoulder exam: No erythema or edema. AROM limited by pain however has nearly full PROM. He has point tenderness directly overlaying the [facility] joint and palpation induces the pain.   Vitals:   06/16/17 1342  BP: (!) 148/72  Pulse: 70  Temp: 98.1 F (36.7 C)  TempSrc: Oral  SpO2: 98%  Weight: 205 lb 8 oz (93.2 kg)   Assessment & Plan:   See Encounters Tab for problem based charting.  Patient discussed with Dr. Dareen Piano

## 2017-06-16 NOTE — Patient Instructions (Addendum)
Jeffrey [name], your symptoms sound consistent with arthritis pain at the [facility] joint. This is the acromioclavicular joint at the front of your shoulder where your clavicle and shoulder blade meet. Your tenderness was directly over this area. Unfortunately, treatments are limited for arthritis but include therapy like rest, HEATING PADS, TOPICAL MEDICATIONS and oral medications like Ibuprofen or Naprosyn.  As you have ESRD on dialysis, you need to be careful with the medications you take. I am prescribing you a topical NSAID to use.  Please continue using heat and your topical creams from the drug store!  Joint Pain Joint pain, which is also called arthralgia, can be caused by many things. Joint pain often goes away when you follow your health care provider's instructions for relieving pain at home. However, joint pain can also be caused by conditions that require further treatment. Common causes of joint pain include:  Bruising in the area of the joint.  Overuse of the joint.  Wear and tear on the joints that occur with aging (osteoarthritis).  Various other forms of arthritis.  A buildup of a crystal form of uric acid in the joint (gout).  Infections of the joint (septic arthritis) or of the bone (osteomyelitis).  Your health care provider may recommend medicine to help with the pain. If your joint pain continues, additional tests may be needed to diagnose your condition. Follow these instructions at home: Watch your condition for any changes. Follow these instructions as directed to lessen the pain that you are feeling.  Take medicines only as directed by your health care provider.  Rest the affected area for as long as your health care provider says that you should. If directed to do so, raise the painful joint above the level of your heart while you are sitting or lying down.  Do not do things that cause or worsen pain.  Use heating pads  Begin exercising or stretching the affected area  as directed by your health care provider. Ask your health care provider what types of exercise are safe for you.  Keep all follow-up visits as directed by your health care provider. This is important.  Contact a health care provider if:  Your pain increases, and medicine does not help.  Your joint pain does not improve within 7 days  You have increased bruising or swelling.  You have a fever.  You lose 10 lb (4.5 kg) or more without trying. Get help right away if:  You are not able to move the joint.  Your fingers or toes become numb or they turn cold and blue. This information is not intended to replace advice given to you by your health care provider. Make sure you discuss any questions you have with your health care provider. Document Released: 09/07/2005 Document Revised: 02/07/2016 Document Reviewed: 06/19/2014 Elsevier Interactive Patient Education  [employer].

## 2017-06-16 NOTE — Assessment & Plan Note (Signed)
Patient here with 2 week hx of left shoulder pain which he noted upon awakening. No trauma to the area or history of similar episodes. ROM examination was limited to pain however had nearly intact PROM. No erythema, swelling or gross deformity was appreciated. He had point tenderness directly overlaying the [facility] joint and a minimal degree of crepitus was appreciated. Unfortunately the patient is ESRD on home HD. He has been taking tylenol and some ibuprofen at home but finds the most relief with heating pads.  -Prescribed topical voltaren gel -Encouraged tylenol use instead of NSAID use however patient states he will likely continue taking ibuprofen -Patient to RTC if symptoms do not improve in 2 weeks

## 2017-06-17 DIAGNOSIS — D631 Anemia in chronic kidney disease: Secondary | ICD-10-CM | POA: Diagnosis not present

## 2017-06-17 DIAGNOSIS — I509 Heart failure, unspecified: Secondary | ICD-10-CM | POA: Diagnosis not present

## 2017-06-17 DIAGNOSIS — D509 Iron deficiency anemia, unspecified: Secondary | ICD-10-CM | POA: Diagnosis not present

## 2017-06-17 DIAGNOSIS — I120 Hypertensive chronic kidney disease with stage 5 chronic kidney disease or end stage renal disease: Secondary | ICD-10-CM | POA: Diagnosis not present

## 2017-06-17 DIAGNOSIS — Z992 Dependence on renal dialysis: Secondary | ICD-10-CM | POA: Diagnosis not present

## 2017-06-17 DIAGNOSIS — N186 End stage renal disease: Secondary | ICD-10-CM | POA: Diagnosis not present

## 2017-06-18 DIAGNOSIS — D631 Anemia in chronic kidney disease: Secondary | ICD-10-CM | POA: Diagnosis not present

## 2017-06-18 DIAGNOSIS — N186 End stage renal disease: Secondary | ICD-10-CM | POA: Diagnosis not present

## 2017-06-18 DIAGNOSIS — Z992 Dependence on renal dialysis: Secondary | ICD-10-CM | POA: Diagnosis not present

## 2017-06-18 DIAGNOSIS — I120 Hypertensive chronic kidney disease with stage 5 chronic kidney disease or end stage renal disease: Secondary | ICD-10-CM | POA: Diagnosis not present

## 2017-06-18 DIAGNOSIS — I509 Heart failure, unspecified: Secondary | ICD-10-CM | POA: Diagnosis not present

## 2017-06-18 DIAGNOSIS — D509 Iron deficiency anemia, unspecified: Secondary | ICD-10-CM | POA: Diagnosis not present

## 2017-06-20 DIAGNOSIS — Z992 Dependence on renal dialysis: Secondary | ICD-10-CM | POA: Diagnosis not present

## 2017-06-20 DIAGNOSIS — N186 End stage renal disease: Secondary | ICD-10-CM | POA: Diagnosis not present

## 2017-06-21 DIAGNOSIS — D509 Iron deficiency anemia, unspecified: Secondary | ICD-10-CM | POA: Diagnosis not present

## 2017-06-21 DIAGNOSIS — I509 Heart failure, unspecified: Secondary | ICD-10-CM | POA: Diagnosis not present

## 2017-06-21 DIAGNOSIS — N186 End stage renal disease: Secondary | ICD-10-CM | POA: Diagnosis not present

## 2017-06-21 DIAGNOSIS — I120 Hypertensive chronic kidney disease with stage 5 chronic kidney disease or end stage renal disease: Secondary | ICD-10-CM | POA: Diagnosis not present

## 2017-06-21 DIAGNOSIS — Z992 Dependence on renal dialysis: Secondary | ICD-10-CM | POA: Diagnosis not present

## 2017-06-21 DIAGNOSIS — D631 Anemia in chronic kidney disease: Secondary | ICD-10-CM | POA: Diagnosis not present

## 2017-06-22 DIAGNOSIS — D631 Anemia in chronic kidney disease: Secondary | ICD-10-CM | POA: Diagnosis not present

## 2017-06-22 DIAGNOSIS — Z992 Dependence on renal dialysis: Secondary | ICD-10-CM | POA: Diagnosis not present

## 2017-06-22 DIAGNOSIS — I509 Heart failure, unspecified: Secondary | ICD-10-CM | POA: Diagnosis not present

## 2017-06-22 DIAGNOSIS — I120 Hypertensive chronic kidney disease with stage 5 chronic kidney disease or end stage renal disease: Secondary | ICD-10-CM | POA: Diagnosis not present

## 2017-06-22 DIAGNOSIS — D509 Iron deficiency anemia, unspecified: Secondary | ICD-10-CM | POA: Diagnosis not present

## 2017-06-22 DIAGNOSIS — N186 End stage renal disease: Secondary | ICD-10-CM | POA: Diagnosis not present

## 2017-06-22 NOTE — Progress Notes (Signed)
Internal Medicine Clinic Attending  Case discussed with Dr. Molt at the time of the visit.  We reviewed the resident's history and exam and pertinent patient test results.  I agree with the assessment, diagnosis, and plan of care documented in the resident's note. 

## 2017-06-24 DIAGNOSIS — I509 Heart failure, unspecified: Secondary | ICD-10-CM | POA: Diagnosis not present

## 2017-06-24 DIAGNOSIS — D631 Anemia in chronic kidney disease: Secondary | ICD-10-CM | POA: Diagnosis not present

## 2017-06-24 DIAGNOSIS — D509 Iron deficiency anemia, unspecified: Secondary | ICD-10-CM | POA: Diagnosis not present

## 2017-06-24 DIAGNOSIS — I120 Hypertensive chronic kidney disease with stage 5 chronic kidney disease or end stage renal disease: Secondary | ICD-10-CM | POA: Diagnosis not present

## 2017-06-24 DIAGNOSIS — N186 End stage renal disease: Secondary | ICD-10-CM | POA: Diagnosis not present

## 2017-06-24 DIAGNOSIS — Z992 Dependence on renal dialysis: Secondary | ICD-10-CM | POA: Diagnosis not present

## 2017-06-25 DIAGNOSIS — D509 Iron deficiency anemia, unspecified: Secondary | ICD-10-CM | POA: Diagnosis not present

## 2017-06-25 DIAGNOSIS — D631 Anemia in chronic kidney disease: Secondary | ICD-10-CM | POA: Diagnosis not present

## 2017-06-25 DIAGNOSIS — N186 End stage renal disease: Secondary | ICD-10-CM | POA: Diagnosis not present

## 2017-06-25 DIAGNOSIS — Z992 Dependence on renal dialysis: Secondary | ICD-10-CM | POA: Diagnosis not present

## 2017-06-25 DIAGNOSIS — I120 Hypertensive chronic kidney disease with stage 5 chronic kidney disease or end stage renal disease: Secondary | ICD-10-CM | POA: Diagnosis not present

## 2017-06-25 DIAGNOSIS — I509 Heart failure, unspecified: Secondary | ICD-10-CM | POA: Diagnosis not present

## 2017-06-28 ENCOUNTER — Other Ambulatory Visit: Payer: Self-pay | Admitting: Internal Medicine

## 2017-06-28 DIAGNOSIS — N186 End stage renal disease: Secondary | ICD-10-CM | POA: Diagnosis not present

## 2017-06-28 DIAGNOSIS — I120 Hypertensive chronic kidney disease with stage 5 chronic kidney disease or end stage renal disease: Secondary | ICD-10-CM | POA: Diagnosis not present

## 2017-06-28 DIAGNOSIS — I509 Heart failure, unspecified: Secondary | ICD-10-CM | POA: Diagnosis not present

## 2017-06-28 DIAGNOSIS — I10 Essential (primary) hypertension: Secondary | ICD-10-CM

## 2017-06-28 DIAGNOSIS — D631 Anemia in chronic kidney disease: Secondary | ICD-10-CM | POA: Diagnosis not present

## 2017-06-28 DIAGNOSIS — D509 Iron deficiency anemia, unspecified: Secondary | ICD-10-CM | POA: Diagnosis not present

## 2017-06-28 DIAGNOSIS — Z992 Dependence on renal dialysis: Secondary | ICD-10-CM | POA: Diagnosis not present

## 2017-06-29 DIAGNOSIS — D631 Anemia in chronic kidney disease: Secondary | ICD-10-CM | POA: Diagnosis not present

## 2017-06-29 DIAGNOSIS — D509 Iron deficiency anemia, unspecified: Secondary | ICD-10-CM | POA: Diagnosis not present

## 2017-06-29 DIAGNOSIS — Z992 Dependence on renal dialysis: Secondary | ICD-10-CM | POA: Diagnosis not present

## 2017-06-29 DIAGNOSIS — N186 End stage renal disease: Secondary | ICD-10-CM | POA: Diagnosis not present

## 2017-06-29 DIAGNOSIS — I120 Hypertensive chronic kidney disease with stage 5 chronic kidney disease or end stage renal disease: Secondary | ICD-10-CM | POA: Diagnosis not present

## 2017-06-29 DIAGNOSIS — I509 Heart failure, unspecified: Secondary | ICD-10-CM | POA: Diagnosis not present

## 2017-07-01 DIAGNOSIS — Z992 Dependence on renal dialysis: Secondary | ICD-10-CM | POA: Diagnosis not present

## 2017-07-01 DIAGNOSIS — I509 Heart failure, unspecified: Secondary | ICD-10-CM | POA: Diagnosis not present

## 2017-07-01 DIAGNOSIS — D631 Anemia in chronic kidney disease: Secondary | ICD-10-CM | POA: Diagnosis not present

## 2017-07-01 DIAGNOSIS — N186 End stage renal disease: Secondary | ICD-10-CM | POA: Diagnosis not present

## 2017-07-01 DIAGNOSIS — D509 Iron deficiency anemia, unspecified: Secondary | ICD-10-CM | POA: Diagnosis not present

## 2017-07-01 DIAGNOSIS — I120 Hypertensive chronic kidney disease with stage 5 chronic kidney disease or end stage renal disease: Secondary | ICD-10-CM | POA: Diagnosis not present

## 2017-07-02 DIAGNOSIS — D631 Anemia in chronic kidney disease: Secondary | ICD-10-CM | POA: Diagnosis not present

## 2017-07-02 DIAGNOSIS — N186 End stage renal disease: Secondary | ICD-10-CM | POA: Diagnosis not present

## 2017-07-02 DIAGNOSIS — Z992 Dependence on renal dialysis: Secondary | ICD-10-CM | POA: Diagnosis not present

## 2017-07-02 DIAGNOSIS — I120 Hypertensive chronic kidney disease with stage 5 chronic kidney disease or end stage renal disease: Secondary | ICD-10-CM | POA: Diagnosis not present

## 2017-07-02 DIAGNOSIS — I509 Heart failure, unspecified: Secondary | ICD-10-CM | POA: Diagnosis not present

## 2017-07-02 DIAGNOSIS — D509 Iron deficiency anemia, unspecified: Secondary | ICD-10-CM | POA: Diagnosis not present

## 2017-07-05 DIAGNOSIS — I120 Hypertensive chronic kidney disease with stage 5 chronic kidney disease or end stage renal disease: Secondary | ICD-10-CM | POA: Diagnosis not present

## 2017-07-05 DIAGNOSIS — D631 Anemia in chronic kidney disease: Secondary | ICD-10-CM | POA: Diagnosis not present

## 2017-07-05 DIAGNOSIS — D509 Iron deficiency anemia, unspecified: Secondary | ICD-10-CM | POA: Diagnosis not present

## 2017-07-05 DIAGNOSIS — Z992 Dependence on renal dialysis: Secondary | ICD-10-CM | POA: Diagnosis not present

## 2017-07-05 DIAGNOSIS — N186 End stage renal disease: Secondary | ICD-10-CM | POA: Diagnosis not present

## 2017-07-05 DIAGNOSIS — I509 Heart failure, unspecified: Secondary | ICD-10-CM | POA: Diagnosis not present

## 2017-07-06 DIAGNOSIS — I120 Hypertensive chronic kidney disease with stage 5 chronic kidney disease or end stage renal disease: Secondary | ICD-10-CM | POA: Diagnosis not present

## 2017-07-06 DIAGNOSIS — I509 Heart failure, unspecified: Secondary | ICD-10-CM | POA: Diagnosis not present

## 2017-07-06 DIAGNOSIS — D509 Iron deficiency anemia, unspecified: Secondary | ICD-10-CM | POA: Diagnosis not present

## 2017-07-06 DIAGNOSIS — D631 Anemia in chronic kidney disease: Secondary | ICD-10-CM | POA: Diagnosis not present

## 2017-07-06 DIAGNOSIS — N186 End stage renal disease: Secondary | ICD-10-CM | POA: Diagnosis not present

## 2017-07-06 DIAGNOSIS — Z992 Dependence on renal dialysis: Secondary | ICD-10-CM | POA: Diagnosis not present

## 2017-07-07 ENCOUNTER — Telehealth: Payer: Self-pay

## 2017-07-07 NOTE — Telephone Encounter (Signed)
Pt would like a prescription medication to stop his itching. Informed he must be seen for this, he is not happy but reluctantly takes a Friday [facility] appt

## 2017-07-07 NOTE — Telephone Encounter (Signed)
Needs to speak with a nurse about itching. Please call pt back.

## 2017-07-07 NOTE — Telephone Encounter (Signed)
Thank you helen.

## 2017-07-08 DIAGNOSIS — I120 Hypertensive chronic kidney disease with stage 5 chronic kidney disease or end stage renal disease: Secondary | ICD-10-CM | POA: Diagnosis not present

## 2017-07-08 DIAGNOSIS — D509 Iron deficiency anemia, unspecified: Secondary | ICD-10-CM | POA: Diagnosis not present

## 2017-07-08 DIAGNOSIS — N186 End stage renal disease: Secondary | ICD-10-CM | POA: Diagnosis not present

## 2017-07-08 DIAGNOSIS — D631 Anemia in chronic kidney disease: Secondary | ICD-10-CM | POA: Diagnosis not present

## 2017-07-08 DIAGNOSIS — I509 Heart failure, unspecified: Secondary | ICD-10-CM | POA: Diagnosis not present

## 2017-07-08 DIAGNOSIS — Z992 Dependence on renal dialysis: Secondary | ICD-10-CM | POA: Diagnosis not present

## 2017-07-09 ENCOUNTER — Ambulatory Visit (INDEPENDENT_AMBULATORY_CARE_PROVIDER_SITE_OTHER): Payer: Medicare Other | Admitting: Internal Medicine

## 2017-07-09 ENCOUNTER — Encounter: Payer: Self-pay | Admitting: Internal Medicine

## 2017-07-09 DIAGNOSIS — D631 Anemia in chronic kidney disease: Secondary | ICD-10-CM | POA: Diagnosis not present

## 2017-07-09 DIAGNOSIS — L538 Other specified erythematous conditions: Secondary | ICD-10-CM | POA: Diagnosis not present

## 2017-07-09 DIAGNOSIS — I509 Heart failure, unspecified: Secondary | ICD-10-CM | POA: Diagnosis not present

## 2017-07-09 DIAGNOSIS — Z21 Asymptomatic human immunodeficiency virus [HIV] infection status: Secondary | ICD-10-CM

## 2017-07-09 DIAGNOSIS — R21 Rash and other nonspecific skin eruption: Secondary | ICD-10-CM | POA: Diagnosis not present

## 2017-07-09 DIAGNOSIS — I120 Hypertensive chronic kidney disease with stage 5 chronic kidney disease or end stage renal disease: Secondary | ICD-10-CM | POA: Diagnosis not present

## 2017-07-09 DIAGNOSIS — L304 Erythema intertrigo: Secondary | ICD-10-CM

## 2017-07-09 DIAGNOSIS — D509 Iron deficiency anemia, unspecified: Secondary | ICD-10-CM | POA: Diagnosis not present

## 2017-07-09 DIAGNOSIS — L299 Pruritus, unspecified: Secondary | ICD-10-CM | POA: Diagnosis not present

## 2017-07-09 DIAGNOSIS — N186 End stage renal disease: Secondary | ICD-10-CM | POA: Diagnosis not present

## 2017-07-09 DIAGNOSIS — Z992 Dependence on renal dialysis: Secondary | ICD-10-CM | POA: Diagnosis not present

## 2017-07-09 MED ORDER — CLOTRIMAZOLE-BETAMETHASONE 1-0.05 % EX CREA: 1.0000 "application " | TOPICAL_CREAM | Freq: Two times a day (BID) | CUTANEOUS | 0 refills | Status: DC

## 2017-07-09 NOTE — Assessment & Plan Note (Addendum)
Patient has a perianal and gluteal cleft rash that appears consistent with a fungal infection. He was prescribed 2.5% hydrocortisone cream by his nephrologist that he used one time without relief. The rash is itchy and irritating. He has diffuse excoriations with skin breakage. Advised patient not to itch and to dry the area completely after showering. Will do a trial of topical antifungal treatment. Of note, patient does have a history of HIV but is well controlled on antiretroviral therapy. Most recent viral load was undetectable, CD4 count 5 months ago was 460. Instructed patient to return to clinic for further evaluation if no improvement with therapy. -- clotrimazole-betamethasone (Lotrisone) BID x 7 days  -- Follow up as needed  ADDENDUM: Called patient to follow up. Reports rash has almost completely resolved with cream.

## 2017-07-09 NOTE — Patient Instructions (Signed)
Mr. Madewell,  It was a pleasure to see you today. I have sent a prescription for a new cream to your pharmacy. Please apply this twice a day for the next week. If your symptoms do not improve in the next 2 weeks, please return to re-evaluation. If you have any questions or concerns, call our clinic at [phone] or after hours call [phone] and ask for the internal medicine resident on call. Thank you!  - Dr. Philipp Ovens

## 2017-07-09 NOTE — Progress Notes (Signed)
   CC: Rash   HPI:  JeffreyJeffrey [name] is a 61 y.o. male with past medical history outlined below here with complaint of rash. For the details of today's visit, please refer to the assessment and plan.  Past Medical History:  Diagnosis Date  . Anemia   . Anxiety   . Arthritis   . Asthma    per pt hx  . End stage renal disease on home HD 07/10/2011   Started HD in September 2012 at [facility] with a tunneled HD catheter, now on home HD with NxtStage. Dialyzing through AVF L lower arm with buttonhole technique as of mid 2014. His brother does the HD treatments at home.  They are roommates for 23 years.  The brother works 3rd shift and gets off about 8am and then puts Mr Jeffrey [name] on HD in the morning after getting home. Most of the time he does HD about 4 times a week, for about 4 hours per treatment. Cause of ESRD was HTN according to patient. He says he let his health go and ending up with complications, and that he didn't like seeing doctors in those days.  He says he was diagnosed with severe HTN when he lived in [place] in his 50's.   . Gout   . Hepatitis B carrier ([place])   . HIV infection ([place])   . Hypertension   . Hyperthyroidism   . Pneumonia   . Seizure ([place])   . Seizures ([place]) 06/02/2011   pt denies this  . Thrombocytopenia ([place])     Review of Systems  Constitutional: Negative for fever.  Skin: Positive for itching and rash.   Physical Exam:  Vitals:   07/09/17 0929 07/09/17 0952  BP: (!) 152/66 138/72  Pulse: 63 66  Temp: 98 F (36.7 C)   SpO2: 97%   Weight: 204 lb 14.4 oz (92.9 kg)   Height: 6' (1.829 m)     Constitutional: NAD, appears comfortable Cardiovascular: RRR, no murmurs, rubs, or gallops.  Pulmonary/Chest: CTAB, no wheezes, rales, or rhonchi.  Skin: Dry, erythematous perianal and gluteal cleft rash with excoriations  Psychiatric: Normal mood and affect  Assessment & Plan:   See Encounters Tab for problem based charting.  Patient discussed with Dr.  Rebeca Alert

## 2017-07-12 DIAGNOSIS — I120 Hypertensive chronic kidney disease with stage 5 chronic kidney disease or end stage renal disease: Secondary | ICD-10-CM | POA: Diagnosis not present

## 2017-07-12 DIAGNOSIS — D631 Anemia in chronic kidney disease: Secondary | ICD-10-CM | POA: Diagnosis not present

## 2017-07-12 DIAGNOSIS — Z992 Dependence on renal dialysis: Secondary | ICD-10-CM | POA: Diagnosis not present

## 2017-07-12 DIAGNOSIS — N186 End stage renal disease: Secondary | ICD-10-CM | POA: Diagnosis not present

## 2017-07-12 DIAGNOSIS — I509 Heart failure, unspecified: Secondary | ICD-10-CM | POA: Diagnosis not present

## 2017-07-12 DIAGNOSIS — D509 Iron deficiency anemia, unspecified: Secondary | ICD-10-CM | POA: Diagnosis not present

## 2017-07-13 DIAGNOSIS — Z992 Dependence on renal dialysis: Secondary | ICD-10-CM | POA: Diagnosis not present

## 2017-07-13 DIAGNOSIS — N186 End stage renal disease: Secondary | ICD-10-CM | POA: Diagnosis not present

## 2017-07-13 DIAGNOSIS — I120 Hypertensive chronic kidney disease with stage 5 chronic kidney disease or end stage renal disease: Secondary | ICD-10-CM | POA: Diagnosis not present

## 2017-07-13 DIAGNOSIS — D631 Anemia in chronic kidney disease: Secondary | ICD-10-CM | POA: Diagnosis not present

## 2017-07-13 DIAGNOSIS — I509 Heart failure, unspecified: Secondary | ICD-10-CM | POA: Diagnosis not present

## 2017-07-13 DIAGNOSIS — D509 Iron deficiency anemia, unspecified: Secondary | ICD-10-CM | POA: Diagnosis not present

## 2017-07-15 DIAGNOSIS — D509 Iron deficiency anemia, unspecified: Secondary | ICD-10-CM | POA: Diagnosis not present

## 2017-07-15 DIAGNOSIS — I120 Hypertensive chronic kidney disease with stage 5 chronic kidney disease or end stage renal disease: Secondary | ICD-10-CM | POA: Diagnosis not present

## 2017-07-15 DIAGNOSIS — D631 Anemia in chronic kidney disease: Secondary | ICD-10-CM | POA: Diagnosis not present

## 2017-07-15 DIAGNOSIS — Z992 Dependence on renal dialysis: Secondary | ICD-10-CM | POA: Diagnosis not present

## 2017-07-15 DIAGNOSIS — I509 Heart failure, unspecified: Secondary | ICD-10-CM | POA: Diagnosis not present

## 2017-07-15 DIAGNOSIS — N186 End stage renal disease: Secondary | ICD-10-CM | POA: Diagnosis not present

## 2017-07-16 DIAGNOSIS — I509 Heart failure, unspecified: Secondary | ICD-10-CM | POA: Diagnosis not present

## 2017-07-16 DIAGNOSIS — Z992 Dependence on renal dialysis: Secondary | ICD-10-CM | POA: Diagnosis not present

## 2017-07-16 DIAGNOSIS — N186 End stage renal disease: Secondary | ICD-10-CM | POA: Diagnosis not present

## 2017-07-16 DIAGNOSIS — I120 Hypertensive chronic kidney disease with stage 5 chronic kidney disease or end stage renal disease: Secondary | ICD-10-CM | POA: Diagnosis not present

## 2017-07-16 DIAGNOSIS — D631 Anemia in chronic kidney disease: Secondary | ICD-10-CM | POA: Diagnosis not present

## 2017-07-16 DIAGNOSIS — D509 Iron deficiency anemia, unspecified: Secondary | ICD-10-CM | POA: Diagnosis not present

## 2017-07-18 NOTE — Progress Notes (Signed)
Internal Medicine Clinic Attending  Case discussed with Dr. Philipp Ovens  at the time of the visit.  We reviewed the resident's history and exam and pertinent patient test results.  I agree with the assessment, diagnosis, and plan of care documented in the resident's note.  Here for rash consistent with intertrigo. HIV is well-controlled, low risk for opportunistic infections or other AIDS-related skin diseases such as Kaposi sarcoma. Will treat empirically with antifungal and follow.  Oda Kilts, MD

## 2017-07-19 DIAGNOSIS — I509 Heart failure, unspecified: Secondary | ICD-10-CM | POA: Diagnosis not present

## 2017-07-19 DIAGNOSIS — D631 Anemia in chronic kidney disease: Secondary | ICD-10-CM | POA: Diagnosis not present

## 2017-07-19 DIAGNOSIS — Z992 Dependence on renal dialysis: Secondary | ICD-10-CM | POA: Diagnosis not present

## 2017-07-19 DIAGNOSIS — N186 End stage renal disease: Secondary | ICD-10-CM | POA: Diagnosis not present

## 2017-07-19 DIAGNOSIS — I120 Hypertensive chronic kidney disease with stage 5 chronic kidney disease or end stage renal disease: Secondary | ICD-10-CM | POA: Diagnosis not present

## 2017-07-19 DIAGNOSIS — D509 Iron deficiency anemia, unspecified: Secondary | ICD-10-CM | POA: Diagnosis not present

## 2017-07-20 DIAGNOSIS — D631 Anemia in chronic kidney disease: Secondary | ICD-10-CM | POA: Diagnosis not present

## 2017-07-20 DIAGNOSIS — N186 End stage renal disease: Secondary | ICD-10-CM | POA: Diagnosis not present

## 2017-07-20 DIAGNOSIS — I509 Heart failure, unspecified: Secondary | ICD-10-CM | POA: Diagnosis not present

## 2017-07-20 DIAGNOSIS — Z992 Dependence on renal dialysis: Secondary | ICD-10-CM | POA: Diagnosis not present

## 2017-07-20 DIAGNOSIS — D509 Iron deficiency anemia, unspecified: Secondary | ICD-10-CM | POA: Diagnosis not present

## 2017-07-20 DIAGNOSIS — I120 Hypertensive chronic kidney disease with stage 5 chronic kidney disease or end stage renal disease: Secondary | ICD-10-CM | POA: Diagnosis not present

## 2017-07-21 DIAGNOSIS — Z992 Dependence on renal dialysis: Secondary | ICD-10-CM | POA: Diagnosis not present

## 2017-07-21 DIAGNOSIS — N186 End stage renal disease: Secondary | ICD-10-CM | POA: Diagnosis not present

## 2017-07-22 DIAGNOSIS — D509 Iron deficiency anemia, unspecified: Secondary | ICD-10-CM | POA: Diagnosis not present

## 2017-07-22 DIAGNOSIS — N2581 Secondary hyperparathyroidism of renal origin: Secondary | ICD-10-CM | POA: Diagnosis not present

## 2017-07-22 DIAGNOSIS — Z992 Dependence on renal dialysis: Secondary | ICD-10-CM | POA: Diagnosis not present

## 2017-07-22 DIAGNOSIS — I509 Heart failure, unspecified: Secondary | ICD-10-CM | POA: Diagnosis not present

## 2017-07-22 DIAGNOSIS — N186 End stage renal disease: Secondary | ICD-10-CM | POA: Diagnosis not present

## 2017-07-22 DIAGNOSIS — D631 Anemia in chronic kidney disease: Secondary | ICD-10-CM | POA: Diagnosis not present

## 2017-07-22 DIAGNOSIS — I120 Hypertensive chronic kidney disease with stage 5 chronic kidney disease or end stage renal disease: Secondary | ICD-10-CM | POA: Diagnosis not present

## 2017-07-23 DIAGNOSIS — N2581 Secondary hyperparathyroidism of renal origin: Secondary | ICD-10-CM | POA: Diagnosis not present

## 2017-07-23 DIAGNOSIS — Z992 Dependence on renal dialysis: Secondary | ICD-10-CM | POA: Diagnosis not present

## 2017-07-23 DIAGNOSIS — N186 End stage renal disease: Secondary | ICD-10-CM | POA: Diagnosis not present

## 2017-07-23 DIAGNOSIS — D509 Iron deficiency anemia, unspecified: Secondary | ICD-10-CM | POA: Diagnosis not present

## 2017-07-23 DIAGNOSIS — I509 Heart failure, unspecified: Secondary | ICD-10-CM | POA: Diagnosis not present

## 2017-07-23 DIAGNOSIS — D631 Anemia in chronic kidney disease: Secondary | ICD-10-CM | POA: Diagnosis not present

## 2017-07-26 DIAGNOSIS — I509 Heart failure, unspecified: Secondary | ICD-10-CM | POA: Diagnosis not present

## 2017-07-26 DIAGNOSIS — N2581 Secondary hyperparathyroidism of renal origin: Secondary | ICD-10-CM | POA: Diagnosis not present

## 2017-07-26 DIAGNOSIS — D631 Anemia in chronic kidney disease: Secondary | ICD-10-CM | POA: Diagnosis not present

## 2017-07-26 DIAGNOSIS — D509 Iron deficiency anemia, unspecified: Secondary | ICD-10-CM | POA: Diagnosis not present

## 2017-07-26 DIAGNOSIS — N186 End stage renal disease: Secondary | ICD-10-CM | POA: Diagnosis not present

## 2017-07-26 DIAGNOSIS — Z992 Dependence on renal dialysis: Secondary | ICD-10-CM | POA: Diagnosis not present

## 2017-07-27 DIAGNOSIS — N2581 Secondary hyperparathyroidism of renal origin: Secondary | ICD-10-CM | POA: Diagnosis not present

## 2017-07-27 DIAGNOSIS — D631 Anemia in chronic kidney disease: Secondary | ICD-10-CM | POA: Diagnosis not present

## 2017-07-27 DIAGNOSIS — I509 Heart failure, unspecified: Secondary | ICD-10-CM | POA: Diagnosis not present

## 2017-07-27 DIAGNOSIS — D509 Iron deficiency anemia, unspecified: Secondary | ICD-10-CM | POA: Diagnosis not present

## 2017-07-27 DIAGNOSIS — N186 End stage renal disease: Secondary | ICD-10-CM | POA: Diagnosis not present

## 2017-07-27 DIAGNOSIS — Z992 Dependence on renal dialysis: Secondary | ICD-10-CM | POA: Diagnosis not present

## 2017-07-29 DIAGNOSIS — N2581 Secondary hyperparathyroidism of renal origin: Secondary | ICD-10-CM | POA: Diagnosis not present

## 2017-07-29 DIAGNOSIS — Z992 Dependence on renal dialysis: Secondary | ICD-10-CM | POA: Diagnosis not present

## 2017-07-29 DIAGNOSIS — D509 Iron deficiency anemia, unspecified: Secondary | ICD-10-CM | POA: Diagnosis not present

## 2017-07-29 DIAGNOSIS — D631 Anemia in chronic kidney disease: Secondary | ICD-10-CM | POA: Diagnosis not present

## 2017-07-29 DIAGNOSIS — I509 Heart failure, unspecified: Secondary | ICD-10-CM | POA: Diagnosis not present

## 2017-07-29 DIAGNOSIS — N186 End stage renal disease: Secondary | ICD-10-CM | POA: Diagnosis not present

## 2017-07-30 DIAGNOSIS — Z992 Dependence on renal dialysis: Secondary | ICD-10-CM | POA: Diagnosis not present

## 2017-07-30 DIAGNOSIS — D509 Iron deficiency anemia, unspecified: Secondary | ICD-10-CM | POA: Diagnosis not present

## 2017-07-30 DIAGNOSIS — I509 Heart failure, unspecified: Secondary | ICD-10-CM | POA: Diagnosis not present

## 2017-07-30 DIAGNOSIS — N186 End stage renal disease: Secondary | ICD-10-CM | POA: Diagnosis not present

## 2017-07-30 DIAGNOSIS — D631 Anemia in chronic kidney disease: Secondary | ICD-10-CM | POA: Diagnosis not present

## 2017-07-30 DIAGNOSIS — N2581 Secondary hyperparathyroidism of renal origin: Secondary | ICD-10-CM | POA: Diagnosis not present

## 2017-08-02 ENCOUNTER — Other Ambulatory Visit: Payer: Medicare Other

## 2017-08-02 DIAGNOSIS — N2581 Secondary hyperparathyroidism of renal origin: Secondary | ICD-10-CM | POA: Diagnosis not present

## 2017-08-02 DIAGNOSIS — N186 End stage renal disease: Secondary | ICD-10-CM | POA: Diagnosis not present

## 2017-08-02 DIAGNOSIS — Z992 Dependence on renal dialysis: Secondary | ICD-10-CM | POA: Diagnosis not present

## 2017-08-02 DIAGNOSIS — I509 Heart failure, unspecified: Secondary | ICD-10-CM | POA: Diagnosis not present

## 2017-08-02 DIAGNOSIS — D509 Iron deficiency anemia, unspecified: Secondary | ICD-10-CM | POA: Diagnosis not present

## 2017-08-02 DIAGNOSIS — D631 Anemia in chronic kidney disease: Secondary | ICD-10-CM | POA: Diagnosis not present

## 2017-08-03 DIAGNOSIS — Z992 Dependence on renal dialysis: Secondary | ICD-10-CM | POA: Diagnosis not present

## 2017-08-03 DIAGNOSIS — D509 Iron deficiency anemia, unspecified: Secondary | ICD-10-CM | POA: Diagnosis not present

## 2017-08-03 DIAGNOSIS — I509 Heart failure, unspecified: Secondary | ICD-10-CM | POA: Diagnosis not present

## 2017-08-03 DIAGNOSIS — D631 Anemia in chronic kidney disease: Secondary | ICD-10-CM | POA: Diagnosis not present

## 2017-08-03 DIAGNOSIS — N186 End stage renal disease: Secondary | ICD-10-CM | POA: Diagnosis not present

## 2017-08-03 DIAGNOSIS — N2581 Secondary hyperparathyroidism of renal origin: Secondary | ICD-10-CM | POA: Diagnosis not present

## 2017-08-05 DIAGNOSIS — D509 Iron deficiency anemia, unspecified: Secondary | ICD-10-CM | POA: Diagnosis not present

## 2017-08-05 DIAGNOSIS — Z992 Dependence on renal dialysis: Secondary | ICD-10-CM | POA: Diagnosis not present

## 2017-08-05 DIAGNOSIS — N186 End stage renal disease: Secondary | ICD-10-CM | POA: Diagnosis not present

## 2017-08-05 DIAGNOSIS — I509 Heart failure, unspecified: Secondary | ICD-10-CM | POA: Diagnosis not present

## 2017-08-05 DIAGNOSIS — D631 Anemia in chronic kidney disease: Secondary | ICD-10-CM | POA: Diagnosis not present

## 2017-08-05 DIAGNOSIS — N2581 Secondary hyperparathyroidism of renal origin: Secondary | ICD-10-CM | POA: Diagnosis not present

## 2017-08-06 ENCOUNTER — Other Ambulatory Visit: Payer: Self-pay | Admitting: Internal Medicine

## 2017-08-06 DIAGNOSIS — N186 End stage renal disease: Secondary | ICD-10-CM | POA: Diagnosis not present

## 2017-08-06 DIAGNOSIS — D631 Anemia in chronic kidney disease: Secondary | ICD-10-CM | POA: Diagnosis not present

## 2017-08-06 DIAGNOSIS — N2581 Secondary hyperparathyroidism of renal origin: Secondary | ICD-10-CM | POA: Diagnosis not present

## 2017-08-06 DIAGNOSIS — D509 Iron deficiency anemia, unspecified: Secondary | ICD-10-CM | POA: Diagnosis not present

## 2017-08-06 DIAGNOSIS — I509 Heart failure, unspecified: Secondary | ICD-10-CM | POA: Diagnosis not present

## 2017-08-06 DIAGNOSIS — Z992 Dependence on renal dialysis: Secondary | ICD-10-CM | POA: Diagnosis not present

## 2017-08-06 MED FILL — ETHYL CHLORIDE SPRAY: 25 days supply | Qty: 104 | Fill #0

## 2017-08-09 DIAGNOSIS — N186 End stage renal disease: Secondary | ICD-10-CM | POA: Diagnosis not present

## 2017-08-09 DIAGNOSIS — N2581 Secondary hyperparathyroidism of renal origin: Secondary | ICD-10-CM | POA: Diagnosis not present

## 2017-08-09 DIAGNOSIS — D509 Iron deficiency anemia, unspecified: Secondary | ICD-10-CM | POA: Diagnosis not present

## 2017-08-09 DIAGNOSIS — I509 Heart failure, unspecified: Secondary | ICD-10-CM | POA: Diagnosis not present

## 2017-08-09 DIAGNOSIS — I871 Compression of vein: Secondary | ICD-10-CM | POA: Diagnosis not present

## 2017-08-09 DIAGNOSIS — T82858A Stenosis of vascular prosthetic devices, implants and grafts, initial encounter: Secondary | ICD-10-CM | POA: Diagnosis not present

## 2017-08-09 DIAGNOSIS — D631 Anemia in chronic kidney disease: Secondary | ICD-10-CM | POA: Diagnosis not present

## 2017-08-09 DIAGNOSIS — Z992 Dependence on renal dialysis: Secondary | ICD-10-CM | POA: Diagnosis not present

## 2017-08-10 DIAGNOSIS — Z992 Dependence on renal dialysis: Secondary | ICD-10-CM | POA: Diagnosis not present

## 2017-08-10 DIAGNOSIS — D509 Iron deficiency anemia, unspecified: Secondary | ICD-10-CM | POA: Diagnosis not present

## 2017-08-10 DIAGNOSIS — N186 End stage renal disease: Secondary | ICD-10-CM | POA: Diagnosis not present

## 2017-08-10 DIAGNOSIS — I509 Heart failure, unspecified: Secondary | ICD-10-CM | POA: Diagnosis not present

## 2017-08-10 DIAGNOSIS — D631 Anemia in chronic kidney disease: Secondary | ICD-10-CM | POA: Diagnosis not present

## 2017-08-10 DIAGNOSIS — N2581 Secondary hyperparathyroidism of renal origin: Secondary | ICD-10-CM | POA: Diagnosis not present

## 2017-08-12 DIAGNOSIS — I509 Heart failure, unspecified: Secondary | ICD-10-CM | POA: Diagnosis not present

## 2017-08-12 DIAGNOSIS — N186 End stage renal disease: Secondary | ICD-10-CM | POA: Diagnosis not present

## 2017-08-12 DIAGNOSIS — N2581 Secondary hyperparathyroidism of renal origin: Secondary | ICD-10-CM | POA: Diagnosis not present

## 2017-08-12 DIAGNOSIS — D631 Anemia in chronic kidney disease: Secondary | ICD-10-CM | POA: Diagnosis not present

## 2017-08-12 DIAGNOSIS — Z992 Dependence on renal dialysis: Secondary | ICD-10-CM | POA: Diagnosis not present

## 2017-08-12 DIAGNOSIS — D509 Iron deficiency anemia, unspecified: Secondary | ICD-10-CM | POA: Diagnosis not present

## 2017-08-13 DIAGNOSIS — N2581 Secondary hyperparathyroidism of renal origin: Secondary | ICD-10-CM | POA: Diagnosis not present

## 2017-08-13 DIAGNOSIS — N186 End stage renal disease: Secondary | ICD-10-CM | POA: Diagnosis not present

## 2017-08-13 DIAGNOSIS — Z992 Dependence on renal dialysis: Secondary | ICD-10-CM | POA: Diagnosis not present

## 2017-08-13 DIAGNOSIS — D631 Anemia in chronic kidney disease: Secondary | ICD-10-CM | POA: Diagnosis not present

## 2017-08-13 DIAGNOSIS — D509 Iron deficiency anemia, unspecified: Secondary | ICD-10-CM | POA: Diagnosis not present

## 2017-08-13 DIAGNOSIS — I509 Heart failure, unspecified: Secondary | ICD-10-CM | POA: Diagnosis not present

## 2017-08-16 ENCOUNTER — Ambulatory Visit: Payer: Medicare Other | Admitting: Infectious Diseases

## 2017-08-16 DIAGNOSIS — N2581 Secondary hyperparathyroidism of renal origin: Secondary | ICD-10-CM | POA: Diagnosis not present

## 2017-08-16 DIAGNOSIS — D631 Anemia in chronic kidney disease: Secondary | ICD-10-CM | POA: Diagnosis not present

## 2017-08-16 DIAGNOSIS — I509 Heart failure, unspecified: Secondary | ICD-10-CM | POA: Diagnosis not present

## 2017-08-16 DIAGNOSIS — D509 Iron deficiency anemia, unspecified: Secondary | ICD-10-CM | POA: Diagnosis not present

## 2017-08-16 DIAGNOSIS — N186 End stage renal disease: Secondary | ICD-10-CM | POA: Diagnosis not present

## 2017-08-16 DIAGNOSIS — Z992 Dependence on renal dialysis: Secondary | ICD-10-CM | POA: Diagnosis not present

## 2017-08-17 DIAGNOSIS — N186 End stage renal disease: Secondary | ICD-10-CM | POA: Diagnosis not present

## 2017-08-17 DIAGNOSIS — D631 Anemia in chronic kidney disease: Secondary | ICD-10-CM | POA: Diagnosis not present

## 2017-08-17 DIAGNOSIS — Z992 Dependence on renal dialysis: Secondary | ICD-10-CM | POA: Diagnosis not present

## 2017-08-17 DIAGNOSIS — N2581 Secondary hyperparathyroidism of renal origin: Secondary | ICD-10-CM | POA: Diagnosis not present

## 2017-08-17 DIAGNOSIS — I509 Heart failure, unspecified: Secondary | ICD-10-CM | POA: Diagnosis not present

## 2017-08-17 DIAGNOSIS — D509 Iron deficiency anemia, unspecified: Secondary | ICD-10-CM | POA: Diagnosis not present

## 2017-08-19 DIAGNOSIS — N186 End stage renal disease: Secondary | ICD-10-CM | POA: Diagnosis not present

## 2017-08-19 DIAGNOSIS — I509 Heart failure, unspecified: Secondary | ICD-10-CM | POA: Diagnosis not present

## 2017-08-19 DIAGNOSIS — D509 Iron deficiency anemia, unspecified: Secondary | ICD-10-CM | POA: Diagnosis not present

## 2017-08-19 DIAGNOSIS — D631 Anemia in chronic kidney disease: Secondary | ICD-10-CM | POA: Diagnosis not present

## 2017-08-19 DIAGNOSIS — N2581 Secondary hyperparathyroidism of renal origin: Secondary | ICD-10-CM | POA: Diagnosis not present

## 2017-08-19 DIAGNOSIS — Z992 Dependence on renal dialysis: Secondary | ICD-10-CM | POA: Diagnosis not present

## 2017-08-20 DIAGNOSIS — N186 End stage renal disease: Secondary | ICD-10-CM | POA: Diagnosis not present

## 2017-08-20 DIAGNOSIS — D509 Iron deficiency anemia, unspecified: Secondary | ICD-10-CM | POA: Diagnosis not present

## 2017-08-20 DIAGNOSIS — Z992 Dependence on renal dialysis: Secondary | ICD-10-CM | POA: Diagnosis not present

## 2017-08-20 DIAGNOSIS — I509 Heart failure, unspecified: Secondary | ICD-10-CM | POA: Diagnosis not present

## 2017-08-20 DIAGNOSIS — D631 Anemia in chronic kidney disease: Secondary | ICD-10-CM | POA: Diagnosis not present

## 2017-08-20 DIAGNOSIS — N2581 Secondary hyperparathyroidism of renal origin: Secondary | ICD-10-CM | POA: Diagnosis not present

## 2017-08-23 DIAGNOSIS — D631 Anemia in chronic kidney disease: Secondary | ICD-10-CM | POA: Diagnosis not present

## 2017-08-23 DIAGNOSIS — I120 Hypertensive chronic kidney disease with stage 5 chronic kidney disease or end stage renal disease: Secondary | ICD-10-CM | POA: Diagnosis not present

## 2017-08-23 DIAGNOSIS — N2581 Secondary hyperparathyroidism of renal origin: Secondary | ICD-10-CM | POA: Diagnosis not present

## 2017-08-23 DIAGNOSIS — N186 End stage renal disease: Secondary | ICD-10-CM | POA: Diagnosis not present

## 2017-08-23 DIAGNOSIS — D509 Iron deficiency anemia, unspecified: Secondary | ICD-10-CM | POA: Diagnosis not present

## 2017-08-23 DIAGNOSIS — I509 Heart failure, unspecified: Secondary | ICD-10-CM | POA: Diagnosis not present

## 2017-08-23 DIAGNOSIS — Z992 Dependence on renal dialysis: Secondary | ICD-10-CM | POA: Diagnosis not present

## 2017-08-24 DIAGNOSIS — I509 Heart failure, unspecified: Secondary | ICD-10-CM | POA: Diagnosis not present

## 2017-08-24 DIAGNOSIS — N2581 Secondary hyperparathyroidism of renal origin: Secondary | ICD-10-CM | POA: Diagnosis not present

## 2017-08-24 DIAGNOSIS — Z992 Dependence on renal dialysis: Secondary | ICD-10-CM | POA: Diagnosis not present

## 2017-08-24 DIAGNOSIS — D631 Anemia in chronic kidney disease: Secondary | ICD-10-CM | POA: Diagnosis not present

## 2017-08-24 DIAGNOSIS — N186 End stage renal disease: Secondary | ICD-10-CM | POA: Diagnosis not present

## 2017-08-24 DIAGNOSIS — D509 Iron deficiency anemia, unspecified: Secondary | ICD-10-CM | POA: Diagnosis not present

## 2017-08-26 ENCOUNTER — Emergency Department (HOSPITAL_COMMUNITY)
Admission: EM | Admit: 2017-08-26 | Discharge: 2017-08-26 | Disposition: A | Discharge status: Home / Self Care | Payer: Medicare Other | Attending: Physician Assistant | Admitting: Physician Assistant

## 2017-08-26 ENCOUNTER — Other Ambulatory Visit: Payer: Self-pay

## 2017-08-26 ENCOUNTER — Encounter (HOSPITAL_COMMUNITY): Payer: Self-pay

## 2017-08-26 ENCOUNTER — Emergency Department (HOSPITAL_COMMUNITY): Payer: Medicare Other

## 2017-08-26 DIAGNOSIS — N186 End stage renal disease: Secondary | ICD-10-CM | POA: Insufficient documentation

## 2017-08-26 DIAGNOSIS — Z87891 Personal history of nicotine dependence: Secondary | ICD-10-CM | POA: Diagnosis not present

## 2017-08-26 DIAGNOSIS — D631 Anemia in chronic kidney disease: Secondary | ICD-10-CM | POA: Diagnosis not present

## 2017-08-26 DIAGNOSIS — B20 Human immunodeficiency virus [HIV] disease: Secondary | ICD-10-CM | POA: Insufficient documentation

## 2017-08-26 DIAGNOSIS — Z79899 Other long term (current) drug therapy: Secondary | ICD-10-CM | POA: Insufficient documentation

## 2017-08-26 DIAGNOSIS — I120 Hypertensive chronic kidney disease with stage 5 chronic kidney disease or end stage renal disease: Secondary | ICD-10-CM | POA: Insufficient documentation

## 2017-08-26 DIAGNOSIS — N2581 Secondary hyperparathyroidism of renal origin: Secondary | ICD-10-CM | POA: Diagnosis not present

## 2017-08-26 DIAGNOSIS — Z992 Dependence on renal dialysis: Secondary | ICD-10-CM | POA: Diagnosis not present

## 2017-08-26 DIAGNOSIS — J449 Chronic obstructive pulmonary disease, unspecified: Secondary | ICD-10-CM | POA: Insufficient documentation

## 2017-08-26 DIAGNOSIS — D509 Iron deficiency anemia, unspecified: Secondary | ICD-10-CM | POA: Diagnosis not present

## 2017-08-26 DIAGNOSIS — M25562 Pain in left knee: Secondary | ICD-10-CM | POA: Diagnosis not present

## 2017-08-26 DIAGNOSIS — M25462 Effusion, left knee: Secondary | ICD-10-CM | POA: Diagnosis not present

## 2017-08-26 DIAGNOSIS — I509 Heart failure, unspecified: Secondary | ICD-10-CM | POA: Diagnosis not present

## 2017-08-26 MED ORDER — OXYCODONE-ACETAMINOPHEN 5-325 MG PO TABS
1.0000 | ORAL_TABLET | Freq: Once | ORAL | Status: AC
  Administered 2017-08-26: 1 via ORAL
  Filled 2017-08-26: qty 1

## 2017-08-26 MED ORDER — OXYCODONE-ACETAMINOPHEN 5-325 MG PO TABS: 1.0000 | ORAL_TABLET | Freq: Four times a day (QID) | ORAL | 0 refills | Status: DC | PRN

## 2017-08-26 NOTE — ED Provider Notes (Signed)
[facility] EMERGENCY DEPARTMENT Provider Note   CSN: [id number] Arrival date & time: 08/26/17  0325     History   Chief Complaint Chief Complaint  Patient presents with  . Joint Swelling    HPI Jeffrey [name] is a 61 y.o. male.  HPI   61 year old male presenting with left knee pain.  Atraumatic knee pain.  Patient has history of left knee pain.  Got worse with little bit of swelling last night.  Patient has trouble sleeping because of the pain.  There is no fevers.  Patient does not have history of joint infections.  Has never had any surgeries to that knee.  Past Medical History:  Diagnosis Date  . Anemia   . Anxiety   . Arthritis   . Asthma    per pt hx  . End stage renal disease on home HD 07/10/2011   Started HD in September 2012 at [facility] with a tunneled HD catheter, now on home HD with [facility]. Dialyzing through AVF L lower arm with buttonhole technique as of mid 2014. His brother does the HD treatments at home.  They are roommates for 23 years.  The brother works 3rd shift and gets off about 8am and then puts Jeffrey [name] on HD in the morning after getting home. Most of the time he does HD about 4 times a week, for about 4 hours per treatment. Cause of ESRD was HTN according to patient. He says he let his health go and ending up with complications, and that he didn't like seeing doctors in those days.  He says he was diagnosed with severe HTN when he lived in [place] in his 69's.   . Gout   . Hepatitis B carrier ([place])   . HIV infection ([place])   . Hypertension   . Hyperthyroidism   . Pneumonia   . Seizure ([place])   . Seizures ([place]) 06/02/2011   pt denies this  . Thrombocytopenia [facility])     Patient Active Problem List   Diagnosis Date Noted  . Intertrigo 07/09/2017  . AC joint arthropathy 06/16/2017  . Pseudoaneurysm of arteriovenous dialysis fistula ([place]) 12/23/2016  . Eczema 12/10/2016  . Bilateral low back pain without sciatica   . Aortic valve  insufficiency   . AIDS ([place])   . Fall 09/08/2016  . Knee abrasion, right, initial encounter 09/08/2016  . Lumbosacral spondylosis without myelopathy 08/03/2016  . Pulmonary nodules 08/03/2016  . Essential hypertension   . Anemia of chronic disease 04/08/2016  . Secondary hyperparathyroidism ([place]) 02/09/2014  . Nuclear sclerotic cataract of both eyes 12/12/2013  . Myopia with presbyopia of both eyes 10/26/2013  . COPD (chronic obstructive pulmonary disease) ([place]) 06/27/2013  . Thrombocytopenia ([place]) 01/09/2013  . HBV (hepatitis B virus) infection 01/09/2013  . Anxiety 07/22/2011  . End stage renal disease on home HD 07/10/2011  . Human immunodeficiency virus (HIV) disease ([place]) 06/17/2011    Past Surgical History:  Procedure Laterality Date  . AV FISTULA PLACEMENT  06/02/11   Left radiocephalic AVF  . COLONOSCOPY    . OTHER SURGICAL HISTORY     removal temporary HD catheter   . REVISON OF ARTERIOVENOUS FISTULA Left 10/10/2015   Procedure: REVISON OF LEFT RADIOCEPHALIC ARTERIOVENOUS FISTULA;  Surgeon: Angelia Mould, MD;  Location: [facility];  Service: Vascular;  Laterality: Left;  . REVISON OF ARTERIOVENOUS FISTULA Left 02/07/2016   Procedure: REPAIR OF PSEUDO-ANEUREYSM OF LEFT ARM  ARTERIOVENOUS FISTULA;  Surgeon: Judeth Cornfield  Scot Dock, MD;  Location: [facility];  Service: Vascular;  Laterality: Left;  . REVISON OF ARTERIOVENOUS FISTULA Left 2/54/2706   Procedure: PLICATION OF LEFT ARM RADIOCEPHALIC ARTERIOVENOUS FISTULA PSEUDOANEURYSM;  Surgeon: Angelia Mould, MD;  Location: [facility];  Service: Vascular;  Laterality: Left;  . RIGHT/LEFT HEART CATH AND CORONARY ANGIOGRAPHY N/A 02/17/2017   Procedure: Right/Left Heart Cath and Coronary Angiography;  Surgeon: Sherren Mocha, MD;  Location: [facility] CV LAB;  Service: Cardiovascular;  Laterality: N/A;  . TEE WITHOUT CARDIOVERSION N/A 09/11/2016   Procedure: TRANSESOPHAGEAL ECHOCARDIOGRAM (TEE);  Surgeon: Dorothy Spark, MD;   Location: [facility] ENDOSCOPY;  Service: Cardiovascular;  Laterality: N/A;       Home Medications    Prior to Admission medications   Medication Sig Start Date End Date Taking? Authorizing Provider  albuterol (PROVENTIL) (2.5 MG/3ML) 0.083% nebulizer solution Take 2.5 mg by nebulization every 6 (six) hours as needed for wheezing or shortness of breath.   Yes [provider]  ALPRAZolam (XANAX) 0.25 MG tablet Take 0.25 mg by mouth daily as needed for anxiety.   Yes [provider]  calcium acetate (PHOSLO) 667 MG capsule Take 667-2,001 mg by mouth See admin instructions. 1,334-2,001 mg three times a day with each meal and 667-1,334 mg with each snack 06/06/15  Yes [provider]  carvedilol (COREG) 25 MG tablet Take 25 mg by mouth 2 (two) times daily with a meal.   Yes [provider]  cloNIDine (CATAPRES - DOSED IN MG/24 HR) 0.1 mg/24hr patch UNWRAP AND APPLY 1 PATCH TO SKIN EVERY 7 DAYS 06/28/17  Yes Velna Ochs, MD  clotrimazole-betamethasone (LOTRISONE) cream Apply 1 application topically 2 (two) times daily. Patient taking differently: Apply 1 application topically 2 (two) times daily as needed (rash).  07/09/17  Yes Velna Ochs, MD  COMBIVENT RESPIMAT 20-100 MCG/ACT AERS respimat INHALE 1 PUFF BY MOUTH EVERY 4 HOURS AS NEEDED FOR WHEEZING OR SHORTNESS OF BREATH 06/09/16  Yes Ophelia Shoulder, MD  doxazosin (CARDURA) 2 MG tablet Take 4 mg by mouth daily. 11/10/16  Yes [provider]  EPIVIR HBV 5 MG/ML solution TAKE 5 ML BY MOUTH EVERY DAY 11/23/16  Yes Campbell Riches, MD  epoetin alfa (EPOGEN,PROCRIT) 2000 UNIT/ML injection Inject 2,000 Units into the vein 3 (three) times a week.   Yes [provider]  ethyl chloride spray Apply 1 application topically daily as needed (HD). At HD 05/20/15  Yes [provider]  heparin 1000 UNIT/ML injection Inject 3,000 Units into the vein 3 (three) times a week.   Yes [provider]   hydrALAZINE (APRESOLINE) 100 MG tablet Take 100 mg by mouth 2 (two) times daily.  08/02/16  Yes [provider]  ibuprofen (ADVIL) 200 MG tablet Take 1 tablet (200 mg total) by mouth every 6 (six) hours as needed. Patient taking differently: Take 200 mg by mouth every 6 (six) hours as needed for mild pain.  06/16/17 06/16/18 Yes Molt, Bethany, DO  ISENTRESS 400 MG tablet TAKE 1 TABLET(400 MG) BY MOUTH TWICE DAILY 05/10/17  Yes Campbell Riches, MD  multivitamin (RENA-VIT) TABS tablet Take 1 tablet by mouth daily.     Yes [provider]  tenofovir (VIREAD) 300 MG tablet TAKE 1 TABLET BY MOUTH EVERY SATURDAY AFTER DIALYSIS 05/03/17  Yes Campbell Riches, MD  traZODone (DESYREL) 50 MG tablet Take 50 mg by mouth at bedtime as needed for sleep.   Yes [provider]  VOLTAREN 1 %  GEL APPLY 2 GRAMS EXTERNALLY TO THE AFFECTED AREA FOUR TIMES DAILY Patient taking differently: APPLY 2 GRAMS EXTERNALLY TO THE AFFECTED AREA FOUR TIMES DAILY AS NEEDED FOR PAIN 08/07/17  Yes Velna Ochs, MD    Family History Family History  Problem Relation Age of Onset  . Hypertension Mother   . Diabetes Mother   . Cancer Father     Social History Social History   Tobacco Use  . Smoking status: Former Smoker    Types: Cigarettes    Last attempt to quit: 08/08/2015    Years since quitting: 2.0  . Smokeless tobacco: Never Used  Substance Use Topics  . Alcohol use: No    Alcohol/week: 0.0 oz  . Drug use: No    Comment: uds (+) cocaine in 08/2011 but pt states was taking sudafed at the time     Allergies   Lisinopril and Penicillins   Review of Systems Review of Systems  Constitutional: Negative for activity change.  Respiratory: Negative for shortness of breath.   Cardiovascular: Negative for chest pain.  Gastrointestinal: Negative for abdominal pain.     Physical Exam Updated Vital Signs BP (!) 167/73 (BP Location: Right Arm)   Pulse 68   Temp 98.2 F (36.8  C) (Oral)   Resp 18   SpO2 100%   Physical Exam  Constitutional: He is oriented to person, place, and time. He appears well-nourished.  HENT:  Head: Normocephalic.  Eyes: Conjunctivae are normal. Right eye exhibits no discharge. Left eye exhibits no discharge.  Cardiovascular: Normal rate.  Pulmonary/Chest: Effort normal and breath sounds normal. No respiratory distress.  Abdominal: Soft. He exhibits no distension.  Musculoskeletal:  Mild left knee swelling.  No erythema.  No pain with passive motion.  Neurological: He is oriented to person, place, and time.  Skin: Skin is warm and dry. He is not diaphoretic.  Psychiatric: He has a normal mood and affect. His behavior is normal.     ED Treatments / Results  Labs (all labs ordered are listed, but only abnormal results are displayed) Labs Reviewed - No data to display  EKG  EKG Interpretation None       Radiology Dg Knee Complete 4 Views Left  Result Date: 08/26/2017 CLINICAL DATA:  61 y/o  M; left anterior knee pain. EXAM: LEFT KNEE - COMPLETE 4+ VIEW COMPARISON:  None. FINDINGS: No acute fracture or dislocation identified. Tricompartmental osteoarthrosis with mild medial femorotibial compartment joint space narrowing and small tricompartmental osteophytes. Moderate suprapatellar joint effusion. Vascular calcifications noted. Bones are demineralized. IMPRESSION: 1. Moderate joint effusion. 2. No acute fracture or dislocation identified. 3. Mild osteoarthrosis with medial femorotibial compartment joint space narrowing and tricompartmental periarticular osteophytes. Electronically Signed   By: Kristine Garbe M.D.   On: 08/26/2017 04:34    Procedures Procedures (including critical care time)  Medications Ordered in ED Medications  oxyCODONE-acetaminophen (PERCOCET/ROXICET) 5-325 MG per tablet 1 tablet (not administered)     Initial Impression / Assessment and Plan / ED Course  I have reviewed the triage vital  signs and the nursing notes.  Pertinent labs & imaging results that were available during my care of the patient were reviewed by me and considered in my medical decision making (see chart for details).    61 year old male presenting with left knee pain.  Atraumatic knee pain.  Patient has history of left knee pain.  Got worse with little bit of swelling last night.  Patient has trouble sleeping because of the  pain.  There is no fevers.  Patient does not have history of joint infections.  Has never had any surgeries to that knee.  7:38 AM We will treat his osteoarthritis.  We will give him pain medication, supportive measures.  Strict return precautions expressed.    Final Clinical Impressions(s) / ED Diagnoses   Final diagnoses:  None    ED Discharge Orders    None       Macarthur Critchley, MD 08/26/17 [phone]

## 2017-08-26 NOTE — ED Triage Notes (Signed)
Pt states that tonight he began to have sudden L knee pain with some swelling, denies injury. Pt is a dialysis pt, last done Tuesday

## 2017-08-26 NOTE — Progress Notes (Signed)
Orthopedic Tech Progress Note Patient Details:  Jeffrey [name] 05-02-1956 [id number]  Ortho Devices Type of Ortho Device: Knee Sleeve Ortho Device/Splint Interventions: Application   Post Interventions Patient Tolerated: Well Instructions Provided: Care of device   Jeffrey [name] 08/26/2017, 8:16 AM

## 2017-08-26 NOTE — Discharge Instructions (Signed)
Please follow up with any redness or fever.

## 2017-08-27 ENCOUNTER — Other Ambulatory Visit: Payer: Medicare Other

## 2017-08-27 DIAGNOSIS — N186 End stage renal disease: Secondary | ICD-10-CM | POA: Diagnosis not present

## 2017-08-27 DIAGNOSIS — D631 Anemia in chronic kidney disease: Secondary | ICD-10-CM | POA: Diagnosis not present

## 2017-08-27 DIAGNOSIS — I509 Heart failure, unspecified: Secondary | ICD-10-CM | POA: Diagnosis not present

## 2017-08-27 DIAGNOSIS — Z992 Dependence on renal dialysis: Secondary | ICD-10-CM | POA: Diagnosis not present

## 2017-08-27 DIAGNOSIS — N2581 Secondary hyperparathyroidism of renal origin: Secondary | ICD-10-CM | POA: Diagnosis not present

## 2017-08-27 DIAGNOSIS — D509 Iron deficiency anemia, unspecified: Secondary | ICD-10-CM | POA: Diagnosis not present

## 2017-08-30 ENCOUNTER — Emergency Department (HOSPITAL_COMMUNITY)
Admission: EM | Admit: 2017-08-30 | Discharge: 2017-08-30 | Disposition: A | Discharge status: Home / Self Care | Payer: Medicare Other | Attending: Emergency Medicine | Admitting: Emergency Medicine

## 2017-08-30 DIAGNOSIS — J449 Chronic obstructive pulmonary disease, unspecified: Secondary | ICD-10-CM | POA: Diagnosis not present

## 2017-08-30 DIAGNOSIS — I120 Hypertensive chronic kidney disease with stage 5 chronic kidney disease or end stage renal disease: Secondary | ICD-10-CM | POA: Diagnosis not present

## 2017-08-30 DIAGNOSIS — M25562 Pain in left knee: Secondary | ICD-10-CM | POA: Diagnosis present

## 2017-08-30 DIAGNOSIS — N186 End stage renal disease: Secondary | ICD-10-CM | POA: Diagnosis not present

## 2017-08-30 DIAGNOSIS — N2581 Secondary hyperparathyroidism of renal origin: Secondary | ICD-10-CM | POA: Diagnosis not present

## 2017-08-30 DIAGNOSIS — J45909 Unspecified asthma, uncomplicated: Secondary | ICD-10-CM | POA: Insufficient documentation

## 2017-08-30 DIAGNOSIS — D631 Anemia in chronic kidney disease: Secondary | ICD-10-CM | POA: Diagnosis not present

## 2017-08-30 DIAGNOSIS — Z87891 Personal history of nicotine dependence: Secondary | ICD-10-CM | POA: Insufficient documentation

## 2017-08-30 DIAGNOSIS — M25462 Effusion, left knee: Secondary | ICD-10-CM | POA: Insufficient documentation

## 2017-08-30 DIAGNOSIS — D509 Iron deficiency anemia, unspecified: Secondary | ICD-10-CM | POA: Diagnosis not present

## 2017-08-30 DIAGNOSIS — B20 Human immunodeficiency virus [HIV] disease: Secondary | ICD-10-CM | POA: Insufficient documentation

## 2017-08-30 DIAGNOSIS — I509 Heart failure, unspecified: Secondary | ICD-10-CM | POA: Diagnosis not present

## 2017-08-30 DIAGNOSIS — Z992 Dependence on renal dialysis: Secondary | ICD-10-CM | POA: Diagnosis not present

## 2017-08-30 LAB — SYNOVIAL CELL COUNT + DIFF, W/ CRYSTALS
Crystals, Fluid: NONE SEEN
Eosinophils-Synovial: 0 % (ref 0–1)
LYMPHOCYTES-SYNOVIAL FLD: 1 % (ref 0–20)
Monocyte-Macrophage-Synovial Fluid: 8 % — ABNORMAL LOW (ref 50–90)
Neutrophil, Synovial: 91 % — ABNORMAL HIGH (ref 0–25)
WBC, Synovial: 15000 /mm3 — ABNORMAL HIGH (ref 0–200)

## 2017-08-30 MED ORDER — OXYCODONE-ACETAMINOPHEN 5-325 MG PO TABS
1.0000 | ORAL_TABLET | Freq: Once | ORAL | Status: DC
  Filled 2017-08-30: qty 1

## 2017-08-30 MED ORDER — PREDNISONE 10 MG PO TABS: 20.0000 mg | ORAL_TABLET | Freq: Two times a day (BID) | ORAL | 0 refills | Status: AC

## 2017-08-30 MED ORDER — DEXAMETHASONE SODIUM PHOSPHATE 10 MG/ML IJ SOLN
10.0000 mg | Freq: Once | INTRAMUSCULAR | Status: AC
  Administered 2017-08-30: 10 mg via INTRAMUSCULAR
  Filled 2017-08-30: qty 1

## 2017-08-30 MED ORDER — LIDOCAINE HCL (PF) 1 % IJ SOLN
10.0000 mL | Freq: Once | INTRAMUSCULAR | Status: AC
  Administered 2017-08-30: 10 mL
  Filled 2017-08-30: qty 10

## 2017-08-30 NOTE — ED Notes (Addendum)
PA speaking with pt about plan of care. MD also at bedisde.

## 2017-08-30 NOTE — ED Provider Notes (Signed)
[place] EMERGENCY DEPARTMENT Provider Note   CSN: [id number] Arrival date & time: 08/30/17  1105     History   Chief Complaint Chief Complaint  Patient presents with  . Knee Pain    HPI Jeffrey [name] is a 61 y.o. male with a history of HIV, hep B, gout, hypertension, arthritis who presents the emergency department today for left knee pain.  Patient was seen here on 08/26/2017 for atraumatic knee pain.  Patient was found to have moderate joint effusion without fracture or dislocation and noted osteoarthritis of the knee.  He was given a knee sleeve and provided with percocoet for home. The patient returns today because he has run out of his Percocet pain.  He notes that his pain is no better or worse and there is no change in his swelling.  The pain is worsened by ambulation and improved with rest.  He is currently walking with a cane provides him some relief.  Patient denies any injury since being seen.  No fever, chills, numbness or tingling of the extremity.  Patient does not have a history of joint infections.  No previous surgery on the knee.   HPI  Past Medical History:  Diagnosis Date  . Anemia   . Anxiety   . Arthritis   . Asthma    per pt hx  . End stage renal disease on home HD 07/10/2011   Started HD in September 2012 at [facility] with a tunneled HD catheter, now on home HD with [facility]. Dialyzing through AVF L lower arm with buttonhole technique as of mid 2014. His brother does the HD treatments at home.  They are roommates for 23 years.  The brother works 3rd shift and gets off about 8am and then puts Jeffrey [name] on HD in the morning after getting home. Most of the time he does HD about 4 times a week, for about 4 hours per treatment. Cause of ESRD was HTN according to patient. He says he let his health go and ending up with complications, and that he didn't like seeing doctors in those days.  He says he was diagnosed with severe HTN when he lived in [place]  in his 70's.   . Gout   . Hepatitis B carrier (Jeffrey [name])   . HIV infection ([place])   . Hypertension   . Hyperthyroidism   . Pneumonia   . Seizure ([place])   . Seizures ([place]) 06/02/2011   pt denies this  . Thrombocytopenia [facility])     Patient Active Problem List   Diagnosis Date Noted  . Intertrigo 07/09/2017  . AC joint arthropathy 06/16/2017  . Pseudoaneurysm of arteriovenous dialysis fistula ([place]) 12/23/2016  . Eczema 12/10/2016  . Bilateral low back pain without sciatica   . Aortic valve insufficiency   . AIDS ([place])   . Fall 09/08/2016  . Knee abrasion, right, initial encounter 09/08/2016  . Lumbosacral spondylosis without myelopathy 08/03/2016  . Pulmonary nodules 08/03/2016  . Essential hypertension   . Anemia of chronic disease 04/08/2016  . Secondary hyperparathyroidism ([place]) 02/09/2014  . Nuclear sclerotic cataract of both eyes 12/12/2013  . Myopia with presbyopia of both eyes 10/26/2013  . COPD (chronic obstructive pulmonary disease) ([place]) 06/27/2013  . Thrombocytopenia ([place]) 01/09/2013  . HBV (hepatitis B virus) infection 01/09/2013  . Anxiety 07/22/2011  . End stage renal disease on home HD 07/10/2011  . Human immunodeficiency virus (HIV) disease ([place]) 06/17/2011    Past Surgical History:  Procedure Laterality Date  . AV FISTULA PLACEMENT  06/02/11   Left radiocephalic AVF  . COLONOSCOPY    . OTHER SURGICAL HISTORY     removal temporary HD catheter   . REVISON OF ARTERIOVENOUS FISTULA Left 10/10/2015   Procedure: REVISON OF LEFT RADIOCEPHALIC ARTERIOVENOUS FISTULA;  Surgeon: Angelia Mould, MD;  Location: [place];  Service: Vascular;  Laterality: Left;  . REVISON OF ARTERIOVENOUS FISTULA Left 02/07/2016   Procedure: REPAIR OF PSEUDO-ANEUREYSM OF LEFT ARM  ARTERIOVENOUS FISTULA;  Surgeon: Angelia Mould, MD;  Location: [place];  Service: Vascular;  Laterality: Left;  . REVISON OF ARTERIOVENOUS FISTULA Left 4/65/6812   Procedure: PLICATION OF LEFT ARM  RADIOCEPHALIC ARTERIOVENOUS FISTULA PSEUDOANEURYSM;  Surgeon: Angelia Mould, MD;  Location: [place];  Service: Vascular;  Laterality: Left;  . RIGHT/LEFT HEART CATH AND CORONARY ANGIOGRAPHY N/A 02/17/2017   Procedure: Right/Left Heart Cath and Coronary Angiography;  Surgeon: Sherren Mocha, MD;  Location: [place] CV LAB;  Service: Cardiovascular;  Laterality: N/A;  . TEE WITHOUT CARDIOVERSION N/A 09/11/2016   Procedure: TRANSESOPHAGEAL ECHOCARDIOGRAM (TEE);  Surgeon: Dorothy Spark, MD;  Location: [facility] ENDOSCOPY;  Service: Cardiovascular;  Laterality: N/A;       Home Medications    Prior to Admission medications   Medication Sig Start Date End Date Taking? Authorizing Provider  albuterol (PROVENTIL) (2.5 MG/3ML) 0.083% nebulizer solution Take 2.5 mg by nebulization every 6 (six) hours as needed for wheezing or shortness of breath.    [provider]  ALPRAZolam Duanne Moron) 0.25 MG tablet Take 0.25 mg by mouth daily as needed for anxiety.    [provider]  calcium acetate (PHOSLO) 667 MG capsule Take 667-2,001 mg by mouth See admin instructions. 1,334-2,001 mg three times a day with each meal and 667-1,334 mg with each snack 06/06/15   [provider]  carvedilol (COREG) 25 MG tablet Take 25 mg by mouth 2 (two) times daily with a meal.    [provider]  cloNIDine (CATAPRES - DOSED IN MG/24 HR) 0.1 mg/24hr patch UNWRAP AND APPLY 1 PATCH TO SKIN EVERY 7 DAYS 06/28/17   Velna Ochs, MD  clotrimazole-betamethasone (LOTRISONE) cream Apply 1 application topically 2 (two) times daily. Patient taking differently: Apply 1 application topically 2 (two) times daily as needed (rash).  07/09/17   Velna Ochs, MD  COMBIVENT RESPIMAT 20-100 MCG/ACT AERS respimat INHALE 1 PUFF BY MOUTH EVERY 4 HOURS AS NEEDED FOR WHEEZING OR SHORTNESS OF BREATH 06/09/16   Ophelia Shoulder, MD  doxazosin (CARDURA) 2 MG tablet Take 4 mg by mouth daily. 11/10/16   [provider]  EPIVIR HBV 5 MG/ML solution TAKE 5 ML BY MOUTH EVERY DAY 11/23/16   Campbell Riches, MD  epoetin alfa (EPOGEN,PROCRIT) 2000 UNIT/ML injection Inject 2,000 Units into the vein 3 (three) times a week.    [provider]  ethyl chloride spray Apply 1 application topically daily as needed (HD). At HD 05/20/15   [provider]  heparin 1000 UNIT/ML injection Inject 3,000 Units into the vein 3 (three) times a week.    [provider]  hydrALAZINE (APRESOLINE) 100 MG tablet Take 100 mg by mouth 2 (two) times daily.  08/02/16   [provider]  ibuprofen (ADVIL) 200 MG tablet Take 1 tablet (200 mg total) by mouth every 6 (six) hours as needed. Patient taking differently: Take 200 mg by mouth every 6 (six) hours as needed for mild pain.  06/16/17 06/16/18  Molt, Bethany, DO  ISENTRESS 400 MG tablet TAKE 1 TABLET(400 MG) BY MOUTH TWICE DAILY 05/10/17   Campbell Riches, MD  multivitamin (RENA-VIT) TABS tablet Take 1 tablet by mouth daily.      [provider]  oxyCODONE-acetaminophen (PERCOCET/ROXICET) 5-325 MG tablet Take 1 tablet by mouth every 6 (six) hours as needed for severe pain. 08/26/17   Mackuen, Courteney Lyn, MD  tenofovir (VIREAD) 300 MG tablet TAKE 1 TABLET BY MOUTH EVERY SATURDAY AFTER DIALYSIS 05/03/17   Campbell Riches, MD  traZODone (DESYREL) 50 MG tablet Take 50 mg by mouth at bedtime as needed for sleep.    [provider]  VOLTAREN 1 % GEL APPLY 2 GRAMS EXTERNALLY TO THE AFFECTED AREA FOUR TIMES DAILY Patient taking differently: APPLY 2 GRAMS EXTERNALLY TO THE AFFECTED AREA FOUR TIMES DAILY AS NEEDED FOR PAIN 08/07/17   Velna Ochs, MD    Family History Family History  Problem Relation Age of Onset  . Hypertension Mother   . Diabetes Mother   . Cancer Father     Social History Social History   Tobacco Use  . Smoking status: Former Smoker    Types: Cigarettes    Last attempt to quit: 08/08/2015     Years since quitting: 2.0  . Smokeless tobacco: Never Used  Substance Use Topics  . Alcohol use: No    Alcohol/week: 0.0 oz  . Drug use: No    Comment: uds (+) cocaine in 08/2011 but pt states was taking sudafed at the time     Allergies   Lisinopril and Penicillins   Review of Systems Review of Systems  Musculoskeletal: Positive for arthralgias and joint swelling.  Skin: Negative for color change.  Neurological: Negative for numbness.     Physical Exam Updated Vital Signs BP (!) 160/60 (BP Location: Right Arm)   Pulse 70   Temp 98.8 F (37.1 C) (Oral)   Resp 16   SpO2 100%   Physical Exam  Constitutional: He appears well-developed and well-nourished.  HENT:  Head: Normocephalic and atraumatic.  Right Ear: External ear normal.  Left Ear: External ear normal.  Eyes: Conjunctivae are normal. Right eye exhibits no discharge. Left eye exhibits no discharge. No scleral icterus.  Cardiovascular:  Pulses:      Dorsalis pedis pulses are 2+ on the left side.       Posterior tibial pulses are 2+ on the left side.  Pulmonary/Chest: Effort normal. No respiratory distress.  Musculoskeletal:  Left knee with moderate effusion/swelling. No obvious deformity. No skin erythema, heat, fluctuance or break of the skin. TTP over anterior joint line. Passive flexion and extension intact without pain. There is noted crepitus. Negative Lachman's test.  Equivocal McMurray's test. No varus or valgus laxity or locking. No TTP of hips or ankles. Compartments soft. Neurovascularly intact distally to site of injury.  Neurological: He is alert.  Skin: Skin is warm and dry. No erythema. No pallor.  Psychiatric: He has a normal mood and affect.  Nursing note and vitals reviewed.    ED Treatments / Results  Labs (all labs ordered are listed, but only abnormal results are displayed) Labs Reviewed - No data to display  EKG  EKG Interpretation None       Radiology   Dg Knee Complete 4  Views Left  Result Date: 08/26/2017 CLINICAL DATA:  61 y/o  M; left anterior knee pain. EXAM: LEFT KNEE - COMPLETE 4+ VIEW COMPARISON:  None. FINDINGS: No acute fracture  or dislocation identified. Tricompartmental osteoarthrosis with mild medial femorotibial compartment joint space narrowing and small tricompartmental osteophytes. Moderate suprapatellar joint effusion. Vascular calcifications noted. Bones are demineralized. IMPRESSION: 1. Moderate joint effusion. 2. No acute fracture or dislocation identified. 3. Mild osteoarthrosis with medial femorotibial compartment joint space narrowing and tricompartmental periarticular osteophytes. Electronically Signed   By: Kristine Garbe M.D.   On: 08/26/2017 04:34    Procedures .Joint Aspiration/Arthrocentesis Date/Time: 08/30/2017 12:57 PM Performed by: Jillyn Ledger, PA-C Authorized by: Jillyn Ledger, PA-C   Consent:    Consent obtained:  Verbal   Consent given by:  Patient   Risks discussed:  Bleeding, incomplete drainage, infection, nerve damage, pain and poor cosmetic result   Alternatives discussed:  No treatment and alternative treatment Location:    Location:  Knee   Knee:  L knee Anesthesia (see MAR for exact dosages):    Anesthesia method:  Local infiltration   Local anesthetic:  Lidocaine 1% w/o epi Procedure details:    Needle gauge:  18 G   Ultrasound guidance: no     Approach:  Lateral   Aspirate amount:  60   Aspirate characteristics:  Yellow and clear   Steroid injected: no     Specimen collected: yes   Post-procedure details:    Dressing:  Adhesive bandage   Patient tolerance of procedure:  Tolerated well, no immediate complications   (including critical care time)  Medications Ordered in ED Medications  oxyCODONE-acetaminophen (PERCOCET/ROXICET) 5-325 MG per tablet 1 tablet (not administered)     Initial Impression / Assessment and Plan / ED Course  I have reviewed the triage vital signs and the  nursing notes.  Pertinent labs & imaging results that were available during my care of the patient were reviewed by me and considered in my medical decision making (see chart for details).     61 y.o. male with a history of HIV, hep B, gout, hypertension, arthritis for left knee pain.  Patient was seen here on 08/26/2017 for atraumatic knee pain.  Xray showed moderate joint effusion without fracture or dislocation and noted osteoarthritis of the knee.  He was treated for arthritis with knee sleeve and provided with percocoet for home. The patient returns today because he has run out of his Percocet pain.  He has been wearing his knee sleeve, using a cane and taking the percocet with some relief. The patient denies worsening pain or swelling. No fevers, overlying erythema or paresthesia's. No history of joint infections.  No previous surgery on the knee.   In the department the patient is without fever.  Left knee is with moderate effusion.  He has intact passive flexion & extension without pain.  There is no overlying erythema.  Do not suspect septic joint.  Patient knee with moderate effusion that would benefit from therapeutic arthrocentesis.   Arthrocentesis was successful. Dr. Gilford Raid at bedside when performed. Fluid sent for cultures. Patient to follow up with orthopedics. Dose of decadron given in the department. Will send the patient home on prednisone burst. Patient isd to take Tylenol otherwise as needed for pain and continue to wear knee brace. Patient given strict return precautions. Appears safe for discharge.    Final Clinical Impressions(s) / ED Diagnoses   Final diagnoses:  Effusion of left knee    ED Discharge Orders        Ordered    predniSONE (DELTASONE) 10 MG tablet  2 times daily with meals     08/30/17  [address]       Jillyn Ledger, [place] 08/30/17 1344    Isla Pence, MD 08/30/17 1455

## 2017-08-30 NOTE — Discharge Instructions (Signed)
Please read and follow all provided instructions.  Your diagnoses today include: Knee Effusion   Tests performed today include: X-rays of the affected joint during your previous visit  Vital signs. See below for your results today.   Be sure to read and understand instructions below prior to leaving the hospital. If your symptoms persist without any improvement in 1 week it is recommended that you follow up with the orthopedics listed above. Use your pain medication as prescribed.  Knee Effusion  The medical term for having fluid in your knee is effusion. This means something is wrong inside the knee. Some of the causes of fluid in the knee may be torn cartilage, a torn ligament, or bleeding into the joint from an injury. Small tears may heal on their own with conservative treatment. Conservative means rest, limited weight bearing activity and muscle strengthening exercises. Your recovery may take up to 6 weeks. Larger tears may require surgery.   You received a knee aspiration in the department today. This was sent for cultures. Please see attached handout.   TREATMENT  Rest, ice, compression and elevation (RICE therapy) are the basic modes of treatment.   Rest is needed to allow your body to heal. Routine activities can be resumed when comfortable (as described above).  Ice: Ice or gel packs can be used to reduce both pain and swelling. Ice is the most helpful within the first 24 to 48 hours after an injury or flareup from overusing a muscle or joint.  Ice is effective, has very few side effects, and is safe for most people to use. Place a dry or damp towel between the ice and skin. A damp towel will cool the skin more quickly, so you may need to shorten the time that the ice is used. For a more rapid response, add gentle compression to the ice. Ice for no more than 10 to 20 minutes at a time. The bonier the area you are icing, the less time it will take to get the benefits of ice. Check your  skin after 5 minutes to make sure there are no signs of a poor response to cold or skin damage. Rest 20 minutes or more in between uses. Once your skin is numb, you can end your treatment. You can test numbness by very lightly touching your skin. The touch should be so light that you do not see the skin dimple from the pressure of your fingertip. When using ice, most people will feel these normal sensations in this order: cold, burning, aching, and numbness. Do not use ice on someone who cannot communicate their responses to pain, such as small children or people with dementia.  If you expose your skin to cold temperatures for too long or without the proper protection, you can damage your skin or nerves. Watch for signs of skin damage due to cold.  Compression: this helps keep swelling down. It also gives support and helps with discomfort. If any lasting bandage has been applied, it should be removed and reapplied every 3-4 hours. It should not be applied tightly, but firmly enough to keep swelling down. Watch fingers or toes for swelling, discoloration, coldness, numbness or excessive pain. If any of these problems occur, removed the bandage and reapply loosely. Contact your caregiver if these problems continue. If you were given an knee imobilizer you may take it off at night and to take a shower or bath. Wiggle your toes in the splint several times per day if  you are able.  Elevation helps reduce swelling and decrease your pain. With extremities such as the arms, hands, legs and feet, the injured area should be placed near or above the level of the heart if possible (place pillows underneath you leg/foot while you sleep to achieve this). If your caregiver recommends crutches, use them as instructed (for up to) 1 week.  Do not drive a vehicle on pain medication. ACTIVITY:            - Weight bearing as tolerated            - Exercises should be limited to pain free range of motion  Knee Immobilization::  This is used to support and protect an injured or painful knee. Knee immobilizers keep your knee from being used while it is healing.  Use powder to control irritation from sweat and friction.  Adjust the immobilizer to be firm but not tight. Signs of an immobilizer that is too tight include:   Swelling.   Numbness.   Color change in your foot or ankle.   Increased pain.  While resting, raise your leg above the level of your heart. This reduces throbbing and helps healing. Prop it up with pillows.  Remove the immobilizer to bathe and sleep. Wear it other times until you see your doctor again.    Please take 650mg  Tylenol every 6 hours as needed for pain. Please take prednisone as prescribed.               SEEK MEDICAL CARE IF:  You have an increase in bruising, swelling, or pain.  Your toes feel cold.  Pain relief is not achieved with medications.  EMERGENCY:: Your toes are numb or blue or you have severe pain.  You notice redness, swelling, warmth or increasing pain in your knee.  An unexplained oral temperature above 102 F (38.9 C) develops.  HOW TO MAKE AN ICE PACK  To make an ice pack, do one of the following:  Place crushed ice or a bag of frozen vegetables in a sealable plastic bag. Squeeze out the excess air. Place this bag inside another plastic bag. Slide the bag into a pillowcase or place a damp towel between your skin and the bag.  Mix 3 parts water with 1 part rubbing alcohol. Freeze the mixture in a sealable plastic bag. When you remove the mixture from the freezer, it will be slushy. Squeeze out the excess air. Place this bag inside another plastic bag. Slide the bag into a pillowcase or place a damp towel between your skin.  Additional Information:  Your vital signs today were: BP (!) 160/60 (BP Location: Right Arm)    Pulse 70    Temp 98.8 F (37.1 C) (Oral)    Resp 16    SpO2 100%  If your blood pressure (BP) was elevated above 135/85 this visit, please have this  repeated by your doctor within one month. ---------------

## 2017-08-30 NOTE — ED Triage Notes (Signed)
Pt was seen here on 12/6 for right knee pain history of arthritis denies injury. Pt states was given percocet and now is out of pain medicine.

## 2017-08-31 ENCOUNTER — Encounter: Payer: Self-pay | Admitting: *Deleted

## 2017-08-31 ENCOUNTER — Other Ambulatory Visit: Payer: Self-pay | Admitting: *Deleted

## 2017-08-31 DIAGNOSIS — N186 End stage renal disease: Secondary | ICD-10-CM | POA: Diagnosis not present

## 2017-08-31 DIAGNOSIS — N2581 Secondary hyperparathyroidism of renal origin: Secondary | ICD-10-CM | POA: Diagnosis not present

## 2017-08-31 DIAGNOSIS — I509 Heart failure, unspecified: Secondary | ICD-10-CM | POA: Diagnosis not present

## 2017-08-31 DIAGNOSIS — D631 Anemia in chronic kidney disease: Secondary | ICD-10-CM | POA: Diagnosis not present

## 2017-08-31 DIAGNOSIS — Z992 Dependence on renal dialysis: Secondary | ICD-10-CM | POA: Diagnosis not present

## 2017-08-31 DIAGNOSIS — D509 Iron deficiency anemia, unspecified: Secondary | ICD-10-CM | POA: Diagnosis not present

## 2017-08-31 NOTE — Patient Outreach (Signed)
[facility]) Care Management  08/31/2017  NICOLE HAFLEY 1956/01/28 [id number]  Telephone Screen  Referral Date:  08/31/2017 Referral Source:  [facility] ED Census Reason for Referral:  1 or more ED visits in the past 6 months Insurance:  Medicare A & B   Outreach Attempt:  Successful telephone outreach to patient.  HIPAA verified.  Reviewed and discussed [facility] services with patient and brother-[name], per patient's request.  Patient and brother both decline screening process and [facility] services at this time.  Encouraged patient and brother to contact [facility] if they would like to participate in services in the future.  Also, instructed and encouraged patient and brother to utilize the 24 hour Nurse Line available.  Plan:   RN Health Coach will send patient Successful Outreach Letter with [place]. RN Health Coach will notify [facility] administrative assistant of case closure status.   [place] [phone] [email or web address]

## 2017-09-01 LAB — GLUCOSE, BODY FLUID OTHER: Glucose, Body Fluid Other: 23 mg/dL

## 2017-09-01 LAB — PROTEIN, BODY FLUID (OTHER): TOTAL PROTEIN, BODY FLUID OTHER: 4.2 g/dL

## 2017-09-02 DIAGNOSIS — Z992 Dependence on renal dialysis: Secondary | ICD-10-CM | POA: Diagnosis not present

## 2017-09-02 DIAGNOSIS — D509 Iron deficiency anemia, unspecified: Secondary | ICD-10-CM | POA: Diagnosis not present

## 2017-09-02 DIAGNOSIS — N186 End stage renal disease: Secondary | ICD-10-CM | POA: Diagnosis not present

## 2017-09-02 DIAGNOSIS — I509 Heart failure, unspecified: Secondary | ICD-10-CM | POA: Diagnosis not present

## 2017-09-02 DIAGNOSIS — D631 Anemia in chronic kidney disease: Secondary | ICD-10-CM | POA: Diagnosis not present

## 2017-09-02 DIAGNOSIS — N2581 Secondary hyperparathyroidism of renal origin: Secondary | ICD-10-CM | POA: Diagnosis not present

## 2017-09-03 DIAGNOSIS — I509 Heart failure, unspecified: Secondary | ICD-10-CM | POA: Diagnosis not present

## 2017-09-03 DIAGNOSIS — D631 Anemia in chronic kidney disease: Secondary | ICD-10-CM | POA: Diagnosis not present

## 2017-09-03 DIAGNOSIS — N186 End stage renal disease: Secondary | ICD-10-CM | POA: Diagnosis not present

## 2017-09-03 DIAGNOSIS — Z992 Dependence on renal dialysis: Secondary | ICD-10-CM | POA: Diagnosis not present

## 2017-09-03 DIAGNOSIS — D509 Iron deficiency anemia, unspecified: Secondary | ICD-10-CM | POA: Diagnosis not present

## 2017-09-03 DIAGNOSIS — N2581 Secondary hyperparathyroidism of renal origin: Secondary | ICD-10-CM | POA: Diagnosis not present

## 2017-09-03 LAB — BODY FLUID CULTURE

## 2017-09-04 ENCOUNTER — Telehealth: Payer: Self-pay

## 2017-09-04 NOTE — Telephone Encounter (Signed)
No treatment needed for body fluid culture from ED 08/30/17 per Langston Masker PA-C and Dr Zenia Resides

## 2017-09-06 DIAGNOSIS — N2581 Secondary hyperparathyroidism of renal origin: Secondary | ICD-10-CM | POA: Diagnosis not present

## 2017-09-06 DIAGNOSIS — Z992 Dependence on renal dialysis: Secondary | ICD-10-CM | POA: Diagnosis not present

## 2017-09-06 DIAGNOSIS — D509 Iron deficiency anemia, unspecified: Secondary | ICD-10-CM | POA: Diagnosis not present

## 2017-09-06 DIAGNOSIS — D631 Anemia in chronic kidney disease: Secondary | ICD-10-CM | POA: Diagnosis not present

## 2017-09-06 DIAGNOSIS — I509 Heart failure, unspecified: Secondary | ICD-10-CM | POA: Diagnosis not present

## 2017-09-06 DIAGNOSIS — N186 End stage renal disease: Secondary | ICD-10-CM | POA: Diagnosis not present

## 2017-09-07 DIAGNOSIS — N2581 Secondary hyperparathyroidism of renal origin: Secondary | ICD-10-CM | POA: Diagnosis not present

## 2017-09-07 DIAGNOSIS — Z992 Dependence on renal dialysis: Secondary | ICD-10-CM | POA: Diagnosis not present

## 2017-09-07 DIAGNOSIS — D631 Anemia in chronic kidney disease: Secondary | ICD-10-CM | POA: Diagnosis not present

## 2017-09-07 DIAGNOSIS — D509 Iron deficiency anemia, unspecified: Secondary | ICD-10-CM | POA: Diagnosis not present

## 2017-09-07 DIAGNOSIS — N186 End stage renal disease: Secondary | ICD-10-CM | POA: Diagnosis not present

## 2017-09-07 DIAGNOSIS — I509 Heart failure, unspecified: Secondary | ICD-10-CM | POA: Diagnosis not present

## 2017-09-08 ENCOUNTER — Ambulatory Visit (INDEPENDENT_AMBULATORY_CARE_PROVIDER_SITE_OTHER): Payer: Medicare Other | Admitting: Infectious Diseases

## 2017-09-08 ENCOUNTER — Encounter: Payer: Self-pay | Admitting: Infectious Diseases

## 2017-09-08 VITALS — BP 148/68 | HR 69 | Temp 98.0°F | Wt 210.0 lb

## 2017-09-08 DIAGNOSIS — L304 Erythema intertrigo: Secondary | ICD-10-CM | POA: Diagnosis not present

## 2017-09-08 DIAGNOSIS — D509 Iron deficiency anemia, unspecified: Secondary | ICD-10-CM | POA: Diagnosis not present

## 2017-09-08 DIAGNOSIS — Z113 Encounter for screening for infections with a predominantly sexual mode of transmission: Secondary | ICD-10-CM

## 2017-09-08 DIAGNOSIS — N2581 Secondary hyperparathyroidism of renal origin: Secondary | ICD-10-CM | POA: Diagnosis not present

## 2017-09-08 DIAGNOSIS — Z992 Dependence on renal dialysis: Secondary | ICD-10-CM | POA: Diagnosis not present

## 2017-09-08 DIAGNOSIS — B20 Human immunodeficiency virus [HIV] disease: Secondary | ICD-10-CM | POA: Diagnosis not present

## 2017-09-08 DIAGNOSIS — I509 Heart failure, unspecified: Secondary | ICD-10-CM | POA: Diagnosis not present

## 2017-09-08 DIAGNOSIS — D631 Anemia in chronic kidney disease: Secondary | ICD-10-CM | POA: Diagnosis not present

## 2017-09-08 DIAGNOSIS — Z79899 Other long term (current) drug therapy: Secondary | ICD-10-CM | POA: Diagnosis not present

## 2017-09-08 DIAGNOSIS — M25462 Effusion, left knee: Secondary | ICD-10-CM | POA: Diagnosis not present

## 2017-09-08 DIAGNOSIS — N186 End stage renal disease: Secondary | ICD-10-CM

## 2017-09-08 DIAGNOSIS — I351 Nonrheumatic aortic (valve) insufficiency: Secondary | ICD-10-CM | POA: Diagnosis not present

## 2017-09-08 MED ORDER — VOLTAREN 1 % TD GEL: TRANSDERMAL | 1 refills | Status: DC

## 2017-09-08 MED ORDER — IBUPROFEN 800 MG PO TABS: 800.0000 mg | ORAL_TABLET | Freq: Three times a day (TID) | ORAL | 2 refills | Status: DC | PRN

## 2017-09-08 MED ORDER — LAMIVUDINE 5 MG/ML PO SOLN: ORAL | 11 refills | Status: DC

## 2017-09-08 MED ORDER — CLOTRIMAZOLE-BETAMETHASONE 1-0.05 % EX CREA: 1.0000 "application " | TOPICAL_CREAM | Freq: Two times a day (BID) | CUTANEOUS | 0 refills | Status: DC

## 2017-09-08 NOTE — Assessment & Plan Note (Signed)
He is doing well.  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

## 2017-09-08 NOTE — Assessment & Plan Note (Signed)
Will f/u with ortho on 12-21

## 2017-09-08 NOTE — Progress Notes (Signed)
   Subjective:    Patient ID: Jeffrey [name], male    DOB: 1956-08-26, 61 y.o.   MRN: [id number]  HPI 61 yo M with hx of HIV+, Hepatitis B, HTN. Currently on issentress/epivir/tenfovir.  Prev seen by CVTS severe AI, aorta was 4.1 cm. Noted to have class II CHF. He had cath that showed no significant stenosis. He is to f/u in 6 months.  His last TTE 02-2017 showed EF 65-70% and mod AR.  On home HD.  Has been feeling well except for recent R knee effusion. He has been taking ibuprofen 800mg  which "knocked the pain out and knocked me out". He cleared this with renal (Dr Eduard Clos). He has ortho f/u on Friday. He believes he will need TKR.   HIV 1 RNA Quant (copies/mL)  Date Value  02/10/2017 <20 NOT DETECTED  09/09/2016 <20  06/10/2016 <20   CD4 T Cell Abs (/uL)  Date Value  02/10/2017 460  09/09/2016 250 (L)  08/07/2016 160 (L)    Review of Systems  Constitutional: Negative for appetite change, chills, fever and unexpected weight change.  Respiratory: Negative for shortness of breath.   Cardiovascular: Negative for chest pain.  Gastrointestinal: Negative for constipation and diarrhea.  Musculoskeletal: Positive for arthralgias.  Neurological: Negative for headaches.  has had sensation that he needs to urinate but when he goes to the toilet has no urine.  Had fistulogram recently due to pain during HD recently. Pain since resolved.  Please see HPI. All other systems reviewed and negative.     Objective:   Physical Exam  Constitutional: He appears well-developed and well-nourished.  HENT:  Mouth/Throat: No oropharyngeal exudate.  Eyes: EOM are normal. Pupils are equal, round, and reactive to light.  Neck: Neck supple.  Cardiovascular: Normal rate, regular rhythm and normal heart sounds.  Pulmonary/Chest: Effort normal and breath sounds normal.  Abdominal: Soft. Bowel sounds are normal. There is no tenderness.  Musculoskeletal: He exhibits no edema.       Arms:       Legs: Lymphadenopathy:    He has no cervical adenopathy.          Assessment & Plan:

## 2017-09-08 NOTE — Assessment & Plan Note (Signed)
He is doing well.  Will f/u with his nephrologist.

## 2017-09-08 NOTE — Assessment & Plan Note (Signed)
Appreciate f/u with Dr Roxan Hockey.  Will get f/u TTE. They will set up.

## 2017-09-09 DIAGNOSIS — N186 End stage renal disease: Secondary | ICD-10-CM | POA: Diagnosis not present

## 2017-09-09 DIAGNOSIS — D631 Anemia in chronic kidney disease: Secondary | ICD-10-CM | POA: Diagnosis not present

## 2017-09-09 DIAGNOSIS — Z992 Dependence on renal dialysis: Secondary | ICD-10-CM | POA: Diagnosis not present

## 2017-09-09 DIAGNOSIS — N2581 Secondary hyperparathyroidism of renal origin: Secondary | ICD-10-CM | POA: Diagnosis not present

## 2017-09-09 DIAGNOSIS — I509 Heart failure, unspecified: Secondary | ICD-10-CM | POA: Diagnosis not present

## 2017-09-09 DIAGNOSIS — D509 Iron deficiency anemia, unspecified: Secondary | ICD-10-CM | POA: Diagnosis not present

## 2017-09-10 ENCOUNTER — Other Ambulatory Visit: Payer: Self-pay

## 2017-09-10 ENCOUNTER — Encounter (HOSPITAL_COMMUNITY): Payer: Self-pay | Admitting: *Deleted

## 2017-09-10 DIAGNOSIS — M1712 Unilateral primary osteoarthritis, left knee: Secondary | ICD-10-CM | POA: Diagnosis not present

## 2017-09-10 DIAGNOSIS — M25569 Pain in unspecified knee: Secondary | ICD-10-CM | POA: Diagnosis not present

## 2017-09-10 DIAGNOSIS — M25562 Pain in left knee: Secondary | ICD-10-CM | POA: Diagnosis not present

## 2017-09-10 DIAGNOSIS — I509 Heart failure, unspecified: Secondary | ICD-10-CM | POA: Diagnosis not present

## 2017-09-10 DIAGNOSIS — Z992 Dependence on renal dialysis: Secondary | ICD-10-CM | POA: Diagnosis not present

## 2017-09-10 DIAGNOSIS — N186 End stage renal disease: Secondary | ICD-10-CM | POA: Diagnosis not present

## 2017-09-10 DIAGNOSIS — D509 Iron deficiency anemia, unspecified: Secondary | ICD-10-CM | POA: Diagnosis not present

## 2017-09-10 DIAGNOSIS — N2581 Secondary hyperparathyroidism of renal origin: Secondary | ICD-10-CM | POA: Diagnosis not present

## 2017-09-10 DIAGNOSIS — G8929 Other chronic pain: Secondary | ICD-10-CM | POA: Diagnosis not present

## 2017-09-10 DIAGNOSIS — D631 Anemia in chronic kidney disease: Secondary | ICD-10-CM | POA: Diagnosis not present

## 2017-09-13 DIAGNOSIS — N2581 Secondary hyperparathyroidism of renal origin: Secondary | ICD-10-CM | POA: Diagnosis not present

## 2017-09-13 DIAGNOSIS — Z992 Dependence on renal dialysis: Secondary | ICD-10-CM | POA: Diagnosis not present

## 2017-09-13 DIAGNOSIS — N186 End stage renal disease: Secondary | ICD-10-CM | POA: Diagnosis not present

## 2017-09-13 DIAGNOSIS — D509 Iron deficiency anemia, unspecified: Secondary | ICD-10-CM | POA: Diagnosis not present

## 2017-09-13 DIAGNOSIS — D631 Anemia in chronic kidney disease: Secondary | ICD-10-CM | POA: Diagnosis not present

## 2017-09-13 DIAGNOSIS — I509 Heart failure, unspecified: Secondary | ICD-10-CM | POA: Diagnosis not present

## 2017-09-14 DIAGNOSIS — I509 Heart failure, unspecified: Secondary | ICD-10-CM | POA: Diagnosis not present

## 2017-09-14 DIAGNOSIS — D509 Iron deficiency anemia, unspecified: Secondary | ICD-10-CM | POA: Diagnosis not present

## 2017-09-14 DIAGNOSIS — D631 Anemia in chronic kidney disease: Secondary | ICD-10-CM | POA: Diagnosis not present

## 2017-09-14 DIAGNOSIS — N2581 Secondary hyperparathyroidism of renal origin: Secondary | ICD-10-CM | POA: Diagnosis not present

## 2017-09-14 DIAGNOSIS — N186 End stage renal disease: Secondary | ICD-10-CM | POA: Diagnosis not present

## 2017-09-14 DIAGNOSIS — Z992 Dependence on renal dialysis: Secondary | ICD-10-CM | POA: Diagnosis not present

## 2017-09-15 ENCOUNTER — Other Ambulatory Visit: Payer: Self-pay

## 2017-09-15 ENCOUNTER — Ambulatory Visit (HOSPITAL_COMMUNITY): Payer: Medicare Other | Admitting: Certified Registered Nurse Anesthetist

## 2017-09-15 ENCOUNTER — Encounter (HOSPITAL_COMMUNITY): Payer: Self-pay | Admitting: *Deleted

## 2017-09-15 ENCOUNTER — Encounter (HOSPITAL_COMMUNITY)
Admission: RE | Disposition: A | Discharge status: Home / Self Care | Payer: Self-pay | Source: Ambulatory Visit | Attending: Orthopedic Surgery

## 2017-09-15 ENCOUNTER — Inpatient Hospital Stay (HOSPITAL_COMMUNITY)
Admission: RE | Admit: 2017-09-15 | Discharge: 2017-09-17 | DRG: 548 | Disposition: A | Discharge status: Home / Self Care | Payer: Medicare Other | Source: Ambulatory Visit | Attending: Orthopedic Surgery | Admitting: Orthopedic Surgery

## 2017-09-15 DIAGNOSIS — J45909 Unspecified asthma, uncomplicated: Secondary | ICD-10-CM | POA: Diagnosis present

## 2017-09-15 DIAGNOSIS — I509 Heart failure, unspecified: Secondary | ICD-10-CM | POA: Diagnosis not present

## 2017-09-15 DIAGNOSIS — M1990 Unspecified osteoarthritis, unspecified site: Secondary | ICD-10-CM | POA: Diagnosis present

## 2017-09-15 DIAGNOSIS — Z888 Allergy status to other drugs, medicaments and biological substances status: Secondary | ICD-10-CM | POA: Diagnosis not present

## 2017-09-15 DIAGNOSIS — M01X0 Direct infection of unspecified joint in infectious and parasitic diseases classified elsewhere: Secondary | ICD-10-CM

## 2017-09-15 DIAGNOSIS — F419 Anxiety disorder, unspecified: Secondary | ICD-10-CM | POA: Diagnosis present

## 2017-09-15 DIAGNOSIS — D509 Iron deficiency anemia, unspecified: Secondary | ICD-10-CM | POA: Diagnosis not present

## 2017-09-15 DIAGNOSIS — Z992 Dependence on renal dialysis: Secondary | ICD-10-CM | POA: Diagnosis not present

## 2017-09-15 DIAGNOSIS — N2581 Secondary hyperparathyroidism of renal origin: Secondary | ICD-10-CM | POA: Diagnosis not present

## 2017-09-15 DIAGNOSIS — Z21 Asymptomatic human immunodeficiency virus [HIV] infection status: Secondary | ICD-10-CM | POA: Diagnosis present

## 2017-09-15 DIAGNOSIS — Z7689 Persons encountering health services in other specified circumstances: Secondary | ICD-10-CM | POA: Insufficient documentation

## 2017-09-15 DIAGNOSIS — M009 Pyogenic arthritis, unspecified: Secondary | ICD-10-CM | POA: Diagnosis not present

## 2017-09-15 DIAGNOSIS — D631 Anemia in chronic kidney disease: Secondary | ICD-10-CM | POA: Diagnosis not present

## 2017-09-15 DIAGNOSIS — M00062 Staphylococcal arthritis, left knee: Secondary | ICD-10-CM | POA: Diagnosis not present

## 2017-09-15 DIAGNOSIS — B20 Human immunodeficiency virus [HIV] disease: Secondary | ICD-10-CM | POA: Diagnosis not present

## 2017-09-15 DIAGNOSIS — Z87891 Personal history of nicotine dependence: Secondary | ICD-10-CM

## 2017-09-15 DIAGNOSIS — Z792 Long term (current) use of antibiotics: Secondary | ICD-10-CM

## 2017-09-15 DIAGNOSIS — Z8249 Family history of ischemic heart disease and other diseases of the circulatory system: Secondary | ICD-10-CM

## 2017-09-15 DIAGNOSIS — Z880 Allergy status to penicillin: Secondary | ICD-10-CM

## 2017-09-15 DIAGNOSIS — Z809 Family history of malignant neoplasm, unspecified: Secondary | ICD-10-CM | POA: Diagnosis not present

## 2017-09-15 DIAGNOSIS — Z7901 Long term (current) use of anticoagulants: Secondary | ICD-10-CM | POA: Diagnosis not present

## 2017-09-15 DIAGNOSIS — N186 End stage renal disease: Secondary | ICD-10-CM | POA: Diagnosis not present

## 2017-09-15 DIAGNOSIS — D649 Anemia, unspecified: Secondary | ICD-10-CM | POA: Diagnosis present

## 2017-09-15 DIAGNOSIS — A498 Other bacterial infections of unspecified site: Secondary | ICD-10-CM

## 2017-09-15 DIAGNOSIS — M0080 Arthritis due to other bacteria, unspecified joint: Secondary | ICD-10-CM

## 2017-09-15 DIAGNOSIS — Z833 Family history of diabetes mellitus: Secondary | ICD-10-CM | POA: Diagnosis not present

## 2017-09-15 DIAGNOSIS — I120 Hypertensive chronic kidney disease with stage 5 chronic kidney disease or end stage renal disease: Secondary | ICD-10-CM | POA: Diagnosis not present

## 2017-09-15 DIAGNOSIS — Z4901 Encounter for fitting and adjustment of extracorporeal dialysis catheter: Secondary | ICD-10-CM | POA: Diagnosis not present

## 2017-09-15 DIAGNOSIS — I1311 Hypertensive heart and chronic kidney disease without heart failure, with stage 5 chronic kidney disease, or end stage renal disease: Secondary | ICD-10-CM | POA: Diagnosis present

## 2017-09-15 DIAGNOSIS — B181 Chronic viral hepatitis B without delta-agent: Secondary | ICD-10-CM | POA: Diagnosis present

## 2017-09-15 DIAGNOSIS — M00862 Arthritis due to other bacteria, left knee: Secondary | ICD-10-CM | POA: Diagnosis not present

## 2017-09-15 DIAGNOSIS — Z0189 Encounter for other specified special examinations: Secondary | ICD-10-CM

## 2017-09-15 DIAGNOSIS — M25462 Effusion, left knee: Secondary | ICD-10-CM | POA: Diagnosis not present

## 2017-09-15 DIAGNOSIS — M25562 Pain in left knee: Secondary | ICD-10-CM | POA: Diagnosis not present

## 2017-09-15 DIAGNOSIS — Z87448 Personal history of other diseases of urinary system: Secondary | ICD-10-CM | POA: Diagnosis not present

## 2017-09-15 HISTORY — PX: IRRIGATION AND DEBRIDEMENT KNEE: SHX5185

## 2017-09-15 LAB — CBC WITH DIFFERENTIAL/PLATELET
Basophils Absolute: 0 10*3/uL (ref 0.0–0.1)
Basophils Relative: 0 %
Eosinophils Absolute: 0.3 10*3/uL (ref 0.0–0.7)
Eosinophils Relative: 3 %
HCT: 21 % — ABNORMAL LOW (ref 39.0–52.0)
Hemoglobin: 7.1 g/dL — ABNORMAL LOW (ref 13.0–17.0)
Lymphocytes Relative: 12 %
Lymphs Abs: 1.1 10*3/uL (ref 0.7–4.0)
MCH: 33.8 pg (ref 26.0–34.0)
MCHC: 33.8 g/dL (ref 30.0–36.0)
MCV: 100 fL (ref 78.0–100.0)
Monocytes Absolute: 0.7 10*3/uL (ref 0.1–1.0)
Monocytes Relative: 7 %
Neutro Abs: 7.6 10*3/uL (ref 1.7–7.7)
Neutrophils Relative %: 78 %
Platelets: 184 10*3/uL (ref 150–400)
RBC: 2.1 MIL/uL — ABNORMAL LOW (ref 4.22–5.81)
RDW: 14 % (ref 11.5–15.5)
WBC: 9.7 10*3/uL (ref 4.0–10.5)

## 2017-09-15 LAB — COMPREHENSIVE METABOLIC PANEL
ALT: 7 U/L — ABNORMAL LOW (ref 17–63)
AST: 14 U/L — ABNORMAL LOW (ref 15–41)
Albumin: 3.1 g/dL — ABNORMAL LOW (ref 3.5–5.0)
Alkaline Phosphatase: 54 U/L (ref 38–126)
Anion gap: 16 — ABNORMAL HIGH (ref 5–15)
BUN: 88 mg/dL — ABNORMAL HIGH (ref 6–20)
CO2: 22 mmol/L (ref 22–32)
Calcium: 8.3 mg/dL — ABNORMAL LOW (ref 8.9–10.3)
Chloride: 101 mmol/L (ref 101–111)
Creatinine, Ser: 14.21 mg/dL — ABNORMAL HIGH (ref 0.61–1.24)
GFR calc Af Amer: 4 mL/min — ABNORMAL LOW (ref >60)
GFR calc non Af Amer: 3 mL/min — ABNORMAL LOW (ref >60)
Glucose, Bld: 98 mg/dL (ref 65–99)
Potassium: 4.9 mmol/L (ref 3.5–5.1)
Sodium: 139 mmol/L (ref 135–145)
Total Bilirubin: 0.7 mg/dL (ref 0.3–1.2)
Total Protein: 6.8 g/dL (ref 6.5–8.1)

## 2017-09-15 LAB — PROTIME-INR
INR: 1.12
Prothrombin Time: 14.3 seconds (ref 11.4–15.2)

## 2017-09-15 LAB — APTT: aPTT: 37 seconds — ABNORMAL HIGH (ref 24–36)

## 2017-09-15 SURGERY — IRRIGATION AND DEBRIDEMENT KNEE
Anesthesia: General | Laterality: Left

## 2017-09-15 MED ORDER — SODIUM CHLORIDE 0.9 % IV SOLN
INTRAVENOUS | Status: DC | PRN
  Administered 2017-09-15: 1000 mg via INTRAVENOUS

## 2017-09-15 MED ORDER — ONDANSETRON HCL 4 MG PO TABS: 4.0000 mg | ORAL_TABLET | Freq: Four times a day (QID) | ORAL | Status: DC | PRN

## 2017-09-15 MED ORDER — ONDANSETRON HCL 4 MG/2ML IJ SOLN: 4.0000 mg | Freq: Four times a day (QID) | INTRAMUSCULAR | Status: DC | PRN

## 2017-09-15 MED ORDER — DEXAMETHASONE SODIUM PHOSPHATE 10 MG/ML IJ SOLN
INTRAMUSCULAR | Status: AC
  Filled 2017-09-15: qty 1

## 2017-09-15 MED ORDER — WARFARIN - PHARMACIST DOSING INPATIENT: Freq: Every day | Status: DC

## 2017-09-15 MED ORDER — METOCLOPRAMIDE HCL 5 MG/ML IJ SOLN: 5.0000 mg | Freq: Three times a day (TID) | INTRAMUSCULAR | Status: DC | PRN

## 2017-09-15 MED ORDER — ASPIRIN 325 MG PO TABS
325.0000 mg | ORAL_TABLET | Freq: Two times a day (BID) | ORAL | Status: DC
  Administered 2017-09-15 – 2017-09-16 (×3): 325 mg via ORAL
  Filled 2017-09-15 (×3): qty 1

## 2017-09-15 MED ORDER — RALTEGRAVIR POTASSIUM 400 MG PO TABS
400.0000 mg | ORAL_TABLET | Freq: Two times a day (BID) | ORAL | Status: DC
  Administered 2017-09-15 – 2017-09-16 (×3): 400 mg via ORAL
  Filled 2017-09-15 (×4): qty 1

## 2017-09-15 MED ORDER — ACETAMINOPHEN 650 MG RE SUPP: 650.0000 mg | RECTAL | Status: DC | PRN

## 2017-09-15 MED ORDER — ALBUTEROL SULFATE (2.5 MG/3ML) 0.083% IN NEBU: 2.5000 mg | INHALATION_SOLUTION | Freq: Four times a day (QID) | RESPIRATORY_TRACT | Status: DC | PRN

## 2017-09-15 MED ORDER — MIDAZOLAM HCL 5 MG/5ML IJ SOLN
INTRAMUSCULAR | Status: DC | PRN
  Administered 2017-09-15: 2 mg via INTRAVENOUS

## 2017-09-15 MED ORDER — PROPOFOL 10 MG/ML IV BOLUS
INTRAVENOUS | Status: AC
  Filled 2017-09-15: qty 20

## 2017-09-15 MED ORDER — OXYCODONE-ACETAMINOPHEN 5-325 MG PO TABS: 1.0000 | ORAL_TABLET | Freq: Four times a day (QID) | ORAL | Status: DC | PRN

## 2017-09-15 MED ORDER — ONDANSETRON HCL 4 MG/2ML IJ SOLN
INTRAMUSCULAR | Status: DC | PRN
  Administered 2017-09-15: 4 mg via INTRAVENOUS

## 2017-09-15 MED ORDER — LACTATED RINGERS IR SOLN
Status: DC | PRN
  Administered 2017-09-15: 3000 mL

## 2017-09-15 MED ORDER — METHOCARBAMOL 500 MG PO TABS
500.0000 mg | ORAL_TABLET | Freq: Four times a day (QID) | ORAL | Status: DC | PRN
  Administered 2017-09-16: 500 mg via ORAL
  Filled 2017-09-15: qty 1

## 2017-09-15 MED ORDER — BACITRACIN ZINC 500 UNIT/GM EX OINT
TOPICAL_OINTMENT | CUTANEOUS | Status: AC
  Filled 2017-09-15: qty 28.35

## 2017-09-15 MED ORDER — HYDROMORPHONE HCL 1 MG/ML IJ SOLN
0.5000 mg | INTRAMUSCULAR | Status: DC | PRN
  Administered 2017-09-15 – 2017-09-16 (×3): 0.5 mg via INTRAVENOUS
  Filled 2017-09-15 (×3): qty 0.5

## 2017-09-15 MED ORDER — SODIUM CHLORIDE 0.9 % IV SOLN
INTRAVENOUS | Status: DC
  Administered 2017-09-15: 14:00:00 via INTRAVENOUS

## 2017-09-15 MED ORDER — CLONIDINE HCL 0.1 MG/24HR TD PTWK: 0.1000 mg | MEDICATED_PATCH | TRANSDERMAL | Status: DC

## 2017-09-15 MED ORDER — METHOCARBAMOL 500 MG PO TABS: 500.0000 mg | ORAL_TABLET | Freq: Four times a day (QID) | ORAL | Status: DC | PRN

## 2017-09-15 MED ORDER — FERROUS SULFATE 325 (65 FE) MG PO TABS: 325.0000 mg | ORAL_TABLET | Freq: Three times a day (TID) | ORAL | Status: DC

## 2017-09-15 MED ORDER — CHLORHEXIDINE GLUCONATE 4 % EX LIQD: 60.0000 mL | Freq: Once | CUTANEOUS | Status: DC

## 2017-09-15 MED ORDER — LIDOCAINE 2% (20 MG/ML) 5 ML SYRINGE
INTRAMUSCULAR | Status: AC
  Filled 2017-09-15: qty 5

## 2017-09-15 MED ORDER — HYDROCODONE-ACETAMINOPHEN 10-325 MG PO TABS
2.0000 | ORAL_TABLET | ORAL | Status: DC | PRN
  Administered 2017-09-16 – 2017-09-17 (×3): 2 via ORAL
  Filled 2017-09-15 (×3): qty 2

## 2017-09-15 MED ORDER — FENTANYL CITRATE (PF) 100 MCG/2ML IJ SOLN
INTRAMUSCULAR | Status: DC | PRN
  Administered 2017-09-15: 25 ug via INTRAVENOUS
  Administered 2017-09-15: 50 ug via INTRAVENOUS
  Administered 2017-09-15: 25 ug via INTRAVENOUS

## 2017-09-15 MED ORDER — CARVEDILOL 25 MG PO TABS
25.0000 mg | ORAL_TABLET | Freq: Two times a day (BID) | ORAL | Status: DC
  Administered 2017-09-16 (×2): 25 mg via ORAL
  Filled 2017-09-15 (×2): qty 1

## 2017-09-15 MED ORDER — FENTANYL CITRATE (PF) 100 MCG/2ML IJ SOLN
INTRAMUSCULAR | Status: AC
  Filled 2017-09-15: qty 2

## 2017-09-15 MED ORDER — VANCOMYCIN HCL IN DEXTROSE 1-5 GM/200ML-% IV SOLN: 1000.0000 mg | Freq: Two times a day (BID) | INTRAVENOUS | Status: DC

## 2017-09-15 MED ORDER — FENTANYL CITRATE (PF) 100 MCG/2ML IJ SOLN
25.0000 ug | INTRAMUSCULAR | Status: DC | PRN
  Administered 2017-09-15 (×3): 50 ug via INTRAVENOUS

## 2017-09-15 MED ORDER — 0.9 % SODIUM CHLORIDE (POUR BTL) OPTIME
TOPICAL | Status: DC | PRN
  Administered 2017-09-15: 1000 mL

## 2017-09-15 MED ORDER — LAMIVUDINE 10 MG/ML PO SOLN
25.0000 mg | Freq: Every day | ORAL | Status: DC
  Administered 2017-09-15 – 2017-09-16 (×2): 25 mg via ORAL
  Filled 2017-09-15 (×2): qty 5

## 2017-09-15 MED ORDER — BISACODYL 5 MG PO TBEC: 5.0000 mg | DELAYED_RELEASE_TABLET | Freq: Every day | ORAL | Status: DC | PRN

## 2017-09-15 MED ORDER — PATIENT'S GUIDE TO USING COUMADIN BOOK
Freq: Once | Status: AC
  Administered 2017-09-16: 13:00:00
  Filled 2017-09-15: qty 1

## 2017-09-15 MED ORDER — VANCOMYCIN HCL IN DEXTROSE 1-5 GM/200ML-% IV SOLN
1000.0000 mg | Freq: Once | INTRAVENOUS | Status: AC
Start: 2017-09-15 — End: 2017-09-15
  Administered 2017-09-15: 1000 mg via INTRAVENOUS
  Filled 2017-09-15: qty 200

## 2017-09-15 MED ORDER — ACETAMINOPHEN 325 MG PO TABS: 650.0000 mg | ORAL_TABLET | ORAL | Status: DC | PRN

## 2017-09-15 MED ORDER — EPHEDRINE 5 MG/ML INJ
INTRAVENOUS | Status: AC
  Filled 2017-09-15: qty 10

## 2017-09-15 MED ORDER — LACTATED RINGERS IV SOLN: INTRAVENOUS | Status: DC

## 2017-09-15 MED ORDER — FERROUS SULFATE 325 (65 FE) MG PO TABS
325.0000 mg | ORAL_TABLET | Freq: Three times a day (TID) | ORAL | Status: DC
  Administered 2017-09-16 (×3): 325 mg via ORAL
  Filled 2017-09-15 (×3): qty 1

## 2017-09-15 MED ORDER — PROPOFOL 10 MG/ML IV BOLUS
INTRAVENOUS | Status: DC | PRN
  Administered 2017-09-15: 200 mg via INTRAVENOUS

## 2017-09-15 MED ORDER — DARBEPOETIN ALFA 25 MCG/0.42ML IJ SOSY: 25.0000 ug | PREFILLED_SYRINGE | INTRAMUSCULAR | Status: DC

## 2017-09-15 MED ORDER — HEPARIN SODIUM (PORCINE) 1000 UNIT/ML IJ SOLN
3000.0000 [IU] | INTRAMUSCULAR | Status: DC
  Filled 2017-09-15: qty 3

## 2017-09-15 MED ORDER — METHOCARBAMOL 1000 MG/10ML IJ SOLN
500.0000 mg | Freq: Four times a day (QID) | INTRAVENOUS | Status: DC | PRN
  Filled 2017-09-15: qty 5

## 2017-09-15 MED ORDER — HYDRALAZINE HCL 50 MG PO TABS
100.0000 mg | ORAL_TABLET | Freq: Two times a day (BID) | ORAL | Status: DC
  Administered 2017-09-15 – 2017-09-16 (×3): 100 mg via ORAL
  Filled 2017-09-15 (×3): qty 2

## 2017-09-15 MED ORDER — DEXAMETHASONE SODIUM PHOSPHATE 10 MG/ML IJ SOLN
INTRAMUSCULAR | Status: DC | PRN
  Administered 2017-09-15: 10 mg via INTRAVENOUS

## 2017-09-15 MED ORDER — METHOCARBAMOL 1000 MG/10ML IJ SOLN: 500.0000 mg | Freq: Four times a day (QID) | INTRAVENOUS | Status: DC | PRN

## 2017-09-15 MED ORDER — METOCLOPRAMIDE HCL 5 MG PO TABS: 5.0000 mg | ORAL_TABLET | Freq: Three times a day (TID) | ORAL | Status: DC | PRN

## 2017-09-15 MED ORDER — OXYCODONE HCL 5 MG/5ML PO SOLN: 5.0000 mg | Freq: Once | ORAL | Status: DC | PRN

## 2017-09-15 MED ORDER — ALPRAZOLAM 0.25 MG PO TABS: 0.2500 mg | ORAL_TABLET | Freq: Every day | ORAL | Status: DC | PRN

## 2017-09-15 MED ORDER — FLEET ENEMA 7-19 GM/118ML RE ENEM: 1.0000 | ENEMA | Freq: Once | RECTAL | Status: DC | PRN

## 2017-09-15 MED ORDER — MIDAZOLAM HCL 2 MG/2ML IJ SOLN
INTRAMUSCULAR | Status: AC
  Filled 2017-09-15: qty 2

## 2017-09-15 MED ORDER — EPOETIN ALFA 2000 UNIT/ML IJ SOLN: 2000.0000 [IU] | INTRAMUSCULAR | Status: DC

## 2017-09-15 MED ORDER — WARFARIN SODIUM 5 MG PO TABS
10.0000 mg | ORAL_TABLET | Freq: Once | ORAL | Status: AC
  Administered 2017-09-15: 10 mg via ORAL
  Filled 2017-09-15: qty 2

## 2017-09-15 MED ORDER — TENOFOVIR DISOPROXIL FUMARATE 300 MG PO TABS
300.0000 mg | ORAL_TABLET | ORAL | Status: DC
Start: 2017-09-18 — End: 2017-09-17

## 2017-09-15 MED ORDER — OXYCODONE HCL 5 MG PO TABS: 5.0000 mg | ORAL_TABLET | Freq: Once | ORAL | Status: DC | PRN

## 2017-09-15 MED ORDER — POLYETHYLENE GLYCOL 3350 17 G PO PACK: 17.0000 g | PACK | Freq: Every day | ORAL | Status: DC | PRN

## 2017-09-15 MED ORDER — DOXAZOSIN MESYLATE 4 MG PO TABS
4.0000 mg | ORAL_TABLET | Freq: Every day | ORAL | Status: DC
  Administered 2017-09-15 – 2017-09-16 (×2): 4 mg via ORAL
  Filled 2017-09-15 (×3): qty 1

## 2017-09-15 MED ORDER — WARFARIN VIDEO: Freq: Once | Status: DC

## 2017-09-15 MED ORDER — VANCOMYCIN HCL IN DEXTROSE 1-5 GM/200ML-% IV SOLN
1000.0000 mg | INTRAVENOUS | Status: AC
  Administered 2017-09-15: 1000 mg via INTRAVENOUS
  Filled 2017-09-15: qty 200

## 2017-09-15 MED ORDER — BUPIVACAINE-EPINEPHRINE 0.25% -1:200000 IJ SOLN
INTRAMUSCULAR | Status: AC
  Filled 2017-09-15: qty 1

## 2017-09-15 MED ORDER — LIDOCAINE 2% (20 MG/ML) 5 ML SYRINGE
INTRAMUSCULAR | Status: DC | PRN
  Administered 2017-09-15: 100 mg via INTRAVENOUS

## 2017-09-15 MED ORDER — BUPIVACAINE-EPINEPHRINE 0.5% -1:200000 IJ SOLN
INTRAMUSCULAR | Status: AC
  Filled 2017-09-15: qty 1

## 2017-09-15 MED ORDER — EPHEDRINE SULFATE-NACL 50-0.9 MG/10ML-% IV SOSY
PREFILLED_SYRINGE | INTRAVENOUS | Status: DC | PRN
  Administered 2017-09-15 (×2): 5 mg via INTRAVENOUS
  Administered 2017-09-15: 10 mg via INTRAVENOUS

## 2017-09-15 MED ORDER — ONDANSETRON HCL 4 MG/2ML IJ SOLN
INTRAMUSCULAR | Status: AC
  Filled 2017-09-15: qty 2

## 2017-09-15 SURGICAL SUPPLY — 31 items
BANDAGE ACE 4X5 VEL STRL LF (GAUZE/BANDAGES/DRESSINGS) ×3 IMPLANT
BANDAGE ACE 6X5 VEL STRL LF (GAUZE/BANDAGES/DRESSINGS) IMPLANT
BLADE GREAT WHITE 4.2 (BLADE) ×2 IMPLANT
BLADE GREAT WHITE 4.2MM (BLADE) ×1
BLADE SURG SZ11 CARB STEEL (BLADE) IMPLANT
BNDG COHESIVE 6X5 TAN STRL LF (GAUZE/BANDAGES/DRESSINGS) ×3 IMPLANT
BNDG GAUZE ELAST 4 BULKY (GAUZE/BANDAGES/DRESSINGS) ×3 IMPLANT
COVER SURGICAL LIGHT HANDLE (MISCELLANEOUS) ×3 IMPLANT
DRAPE SHEET LG 3/4 BI-LAMINATE (DRAPES) ×6 IMPLANT
DRSG EMULSION OIL 3X3 NADH (GAUZE/BANDAGES/DRESSINGS) ×3 IMPLANT
DRSG PAD ABDOMINAL 8X10 ST (GAUZE/BANDAGES/DRESSINGS) ×6 IMPLANT
DURAPREP 26ML APPLICATOR (WOUND CARE) ×3 IMPLANT
GAUZE SPONGE 4X4 12PLY STRL (GAUZE/BANDAGES/DRESSINGS) ×3 IMPLANT
GLOVE BIOGEL PI IND STRL 8.5 (GLOVE) ×1 IMPLANT
GLOVE BIOGEL PI INDICATOR 8.5 (GLOVE) ×2
GLOVE ECLIPSE 8.0 STRL XLNG CF (GLOVE) ×3 IMPLANT
GOWN STRL REUS W/TWL LRG LVL3 (GOWN DISPOSABLE) ×3 IMPLANT
GOWN STRL REUS W/TWL XL LVL3 (GOWN DISPOSABLE) ×6 IMPLANT
KIT BASIN OR (CUSTOM PROCEDURE TRAY) IMPLANT
MANIFOLD NEPTUNE II (INSTRUMENTS) ×3 IMPLANT
PACK ARTHROSCOPY WL (CUSTOM PROCEDURE TRAY) ×3 IMPLANT
PACK ICE MAXI GEL EZY WRAP (MISCELLANEOUS) ×3 IMPLANT
PAD ABD 8X10 STRL (GAUZE/BANDAGES/DRESSINGS) ×3 IMPLANT
PAD MASON LEG HOLDER (PIN) ×3 IMPLANT
SUT ETHILON 3 0 PS 1 (SUTURE) ×3 IMPLANT
TOWEL OR 17X26 10 PK STRL BLUE (TOWEL DISPOSABLE) ×9 IMPLANT
TUBING ARTHRO INFLOW-ONLY STRL (TUBING) ×3 IMPLANT
TUBING CONNECTING 10 (TUBING) ×2 IMPLANT
TUBING CONNECTING 10' (TUBING) ×1
WAND HAND CNTRL MULTIVAC 90 (MISCELLANEOUS) IMPLANT
WRAP KNEE MAXI GEL POST OP (GAUZE/BANDAGES/DRESSINGS) ×9 IMPLANT

## 2017-09-15 NOTE — Brief Op Note (Signed)
09/15/2017  5:09 PM  PATIENT:  Jeffrey [name]  61 y.o. male  PRE-OPERATIVE DIAGNOSIS:  Left knee infection,Septic Joint  POST-OPERATIVE DIAGNOSIS:  Left knee infection,Septic Joint,  PROCEDURE:  Procedure(s): IRRIGATION AND DEBRIDEMENT KNEE; arthroscopic clean out (Left)  SURGEON:  Surgeon(s) and Role:    [name], MD - Primary  PHYSICIAN ASSISTANT:None  ASSISTANTS: OR Tech   ANESTHESIA:   none  EBL:  10cc   BLOOD ADMINISTERED:none  DRAINS: (one) Hemovact drain(s) in the Left with  Suction Open   LOCAL MEDICATIONS USED:  NONE  SPECIMEN:  Source of Specimen:  Left Knee Joint  DISPOSITION OF SPECIMEN:  PATHOLOGY  COUNTS:  YES  TOURNIQUET:  * No tourniquets in log *  DICTATION: .Other Dictation: Dictation Number [id number]  PLAN OF CARE: Admit to inpatient   PATIENT DISPOSITION:  PACU - hemodynamically stable.   Delay start of Pharmacological VTE agent (>24hrs) due to surgical blood loss or risk of bleeding: yes

## 2017-09-15 NOTE — H&P (Signed)
Jeffrey [name] is an 61 y.o. male.   Chief Complaint: Pain and swelling in Left Knee. HPI: Patient was seen by me 12days after going to the ER where his Left Knee was aspirated and cultures shwowed Staph. I wanted to admit him from the office but he refused and agreed to come in today for surgery,5 days after  I re-aspirated his knee in the office. He was started on antibiotics in the office and has remained afebrile.  Past Medical History:  Diagnosis Date  . Anemia   . Anxiety   . Arthritis   . Asthma    per pt hx  . End stage renal disease on home HD 07/10/2011   Started HD in September 2012 at [facility] with a tunneled HD catheter, now on home HD with NxtStage. Dialyzing through AVF L lower arm with buttonhole technique as of mid 2014. His brother does the HD treatments at home.  They are roommates for 23 years.  The brother works 3rd shift and gets off about 8am and then puts Mr Manasco on HD in the morning after getting home. Most of the time he does HD about 4 times a week, for about 4 hours per treatment. Cause of ESRD was HTN according to patient. He says he let his health go and ending up with complications, and that he didn't like seeing doctors in those days.  He says he was diagnosed with severe HTN when he lived in [place] in his 65's.   . Hepatitis B carrier ([place])   . HIV infection ([place])   . Hypertension   . Hyperthyroidism    normal now  . Pneumonia several yrs ago  . Seizure ([place])   . Seizures ([place]) 06/02/2011   x 1 none since  . Thrombocytopenia ([place])     Past Surgical History:  Procedure Laterality Date  . AV FISTULA PLACEMENT  06/02/11   Left radiocephalic AVF  . COLONOSCOPY    . fistulaogram     x 2 last 2 years  . OTHER SURGICAL HISTORY     removal temporary HD catheter   . REVISON OF ARTERIOVENOUS FISTULA Left 10/10/2015   Procedure: REVISON OF LEFT RADIOCEPHALIC ARTERIOVENOUS FISTULA;  Surgeon: Angelia Mould, MD;  Location: [facility];  Service: Vascular;   Laterality: Left;  . REVISON OF ARTERIOVENOUS FISTULA Left 02/07/2016   Procedure: REPAIR OF PSEUDO-ANEUREYSM OF LEFT ARM  ARTERIOVENOUS FISTULA;  Surgeon: Angelia Mould, MD;  Location: [facility];  Service: Vascular;  Laterality: Left;  . REVISON OF ARTERIOVENOUS FISTULA Left 1/69/4503   Procedure: PLICATION OF LEFT ARM RADIOCEPHALIC ARTERIOVENOUS FISTULA PSEUDOANEURYSM;  Surgeon: Angelia Mould, MD;  Location: [facility];  Service: Vascular;  Laterality: Left;  . RIGHT/LEFT HEART CATH AND CORONARY ANGIOGRAPHY N/A 02/17/2017   Procedure: Right/Left Heart Cath and Coronary Angiography;  Surgeon: Sherren Mocha, MD;  Location: [facility] CV [facility];  Service: Cardiovascular;  Laterality: N/A;  . TEE WITHOUT CARDIOVERSION N/A 09/11/2016   Procedure: TRANSESOPHAGEAL ECHOCARDIOGRAM (TEE);  Surgeon: Dorothy Spark, MD;  Location: [facility] ENDOSCOPY;  Service: Cardiovascular;  Laterality: N/A;    Family History  Problem Relation Age of Onset  . Hypertension Mother   . Diabetes Mother   . Cancer Father    Social History:  reports that he quit smoking about 2 years ago. His smoking use included cigarettes. He quit after 10.00 years of use. he has never used smokeless tobacco. He reports that he does not drink alcohol or  use drugs.  Allergies:  Allergies  Allergen Reactions  . Lisinopril Anaphylaxis and Shortness Of Breath    Throat swelling  . Penicillins Other (See Comments)    Childhood allergy Has patient had a PCN reaction causing immediate rash, facial/tongue/throat swelling, SOB or lightheadedness with hypotension: Unk Has patient had a PCN reaction causing severe rash involving mucus membranes or skin necrosis: Unk Has patient had a PCN reaction that required hospitalization: Unk Has patient had a PCN reaction occurring within the last 10 years: No If all of the above answers are "NO", then may proceed with Cephalosporin use.     Medications Prior to Admission  Medication Sig Dispense  Refill  . albuterol (PROVENTIL) (2.5 MG/3ML) 0.083% nebulizer solution Take 2.5 mg by nebulization every 6 (six) hours as needed for wheezing or shortness of breath.    . ALPRAZolam (XANAX) 0.25 MG tablet Take 0.25 mg by mouth daily as needed for anxiety.    . calcium acetate (PHOSLO) 667 MG capsule Take 667-2,001 mg by mouth See admin instructions. 1,334-2,001 mg three times a day with each meal and 667-1,334 mg with each snack    . carvedilol (COREG) 25 MG tablet Take 25 mg by mouth 2 (two) times daily with a meal.    . cloNIDine (CATAPRES - DOSED IN MG/24 HR) 0.1 mg/24hr patch UNWRAP AND APPLY 1 PATCH TO SKIN EVERY 7 DAYS (Patient taking differently: UNWRAP AND APPLY 1 PATCH TO SKIN EVERY 7 DAYS (Fridays)) 4 patch 2  . clotrimazole-betamethasone (LOTRISONE) cream Apply 1 application topically 2 (two) times daily. 30 g 0  . doxazosin (CARDURA) 4 MG tablet Take 4 mg by mouth daily.  8  . doxycycline (ADOXA) 100 MG tablet Take 100 mg by mouth 2 (two) times daily.  0  . doxycycline (VIBRA-TABS) 100 MG tablet Take 100 mg by mouth 2 (two) times daily.    Marland Kitchen epoetin alfa (EPOGEN,PROCRIT) 2000 UNIT/ML injection Inject 2,000 Units into the vein 3 (three) times a week.    . ethyl chloride spray Apply 1 application topically daily as needed (HD). At HD  12  . heparin 1000 UNIT/ML injection Inject 3,000 Units into the vein 3 (three) times a week.    . hydrALAZINE (APRESOLINE) 100 MG tablet Take 100 mg by mouth 2 (two) times daily.     . Ibuprofen-Famotidine (DUEXIS) 800-26.6 MG TABS Take 1 tablet by mouth every 8 (eight) hours as needed (PAIN).    . ISENTRESS 400 MG tablet TAKE 1 TABLET(400 MG) BY MOUTH TWICE DAILY 60 tablet 3  . lamivudine (EPIVIR HBV) 5 MG/ML solution TAKE 5 ML BY MOUTH EVERY DAY (Patient taking differently: Take by mouth at bedtime. TAKE 5 ML BY MOUTH EVERY DAY) 240 mL 11  . multivitamin (RENA-VIT) TABS tablet Take 1 tablet by mouth daily.      Marland Kitchen PROCTOZONE-HC 2.5 % rectal cream Place 1  application rectally 2 (two) times daily as needed.  6  . tenofovir (VIREAD) 300 MG tablet TAKE 1 TABLET BY MOUTH EVERY SATURDAY AFTER DIALYSIS 5 tablet 4  . traZODone (DESYREL) 50 MG tablet Take 50 mg by mouth at bedtime as needed for sleep.    . VOLTAREN 1 % GEL APPLY 2 GRAMS EXTERNALLY TO THE AFFECTED AREA FOUR TIMES DAILY 100 g 1  . COMBIVENT RESPIMAT 20-100 MCG/ACT AERS respimat INHALE 1 PUFF BY MOUTH EVERY 4 HOURS AS NEEDED FOR WHEEZING OR SHORTNESS OF BREATH (Patient not taking: Reported on 09/15/2017) 4 g 0  .  ibuprofen (ADVIL,MOTRIN) 800 MG tablet Take 1 tablet (800 mg total) by mouth every 8 (eight) hours as needed. Take with food (Patient not taking: Reported on 09/15/2017) 30 tablet 2  . oxyCODONE-acetaminophen (PERCOCET/ROXICET) 5-325 MG tablet Take 1 tablet by mouth every 6 (six) hours as needed for severe pain. (Patient not taking: Reported on 09/08/2017) 7 tablet 0    Results for orders placed or performed during the hospital encounter of 09/15/17 (from the past 48 hour(s))  CBC WITH DIFFERENTIAL     Status: Abnormal   Collection Time: 09/15/17  1:30 PM  Result Value Ref Range   WBC 9.7 4.0 - 10.5 K/uL   RBC 2.10 (L) 4.22 - 5.81 MIL/uL   Hemoglobin 7.1 (L) 13.0 - 17.0 g/dL   HCT 21.0 (L) 39.0 - 52.0 %   MCV 100.0 78.0 - 100.0 fL   MCH 33.8 26.0 - 34.0 pg   MCHC 33.8 30.0 - 36.0 g/dL   RDW 14.0 11.5 - 15.5 %   Platelets 184 150 - 400 K/uL   Neutrophils Relative % 78 %   Neutro Abs 7.6 1.7 - 7.7 K/uL   Lymphocytes Relative 12 %   Lymphs Abs 1.1 0.7 - 4.0 K/uL   Monocytes Relative 7 %   Monocytes Absolute 0.7 0.1 - 1.0 K/uL   Eosinophils Relative 3 %   Eosinophils Absolute 0.3 0.0 - 0.7 K/uL   Basophils Relative 0 %   Basophils Absolute 0.0 0.0 - 0.1 K/uL  Comprehensive metabolic panel     Status: Abnormal   Collection Time: 09/15/17  1:30 PM  Result Value Ref Range   Sodium 139 135 - 145 mmol/L   Potassium 4.9 3.5 - 5.1 mmol/L   Chloride 101 101 - 111 mmol/L   CO2  22 22 - 32 mmol/L   Glucose, Bld 98 65 - 99 mg/dL   BUN 88 (H) 6 - 20 mg/dL   Creatinine, Ser 14.21 (H) 0.61 - 1.24 mg/dL   Calcium 8.3 (L) 8.9 - 10.3 mg/dL   Total Protein 6.8 6.5 - 8.1 g/dL   Albumin 3.1 (L) 3.5 - 5.0 g/dL   AST 14 (L) 15 - 41 U/L   ALT 7 (L) 17 - 63 U/L   Alkaline Phosphatase 54 38 - 126 U/L   Total Bilirubin 0.7 0.3 - 1.2 mg/dL   GFR calc non Af Amer 3 (L) >60 mL/min   GFR calc Af Amer 4 (L) >60 mL/min    Comment: (NOTE) The eGFR has been calculated using the CKD EPI equation. This calculation has not been validated in all clinical situations. eGFR's persistently <60 mL/min signify possible Chronic Kidney Disease.    Anion gap 16 (H) 5 - 15  Protime-INR     Status: None   Collection Time: 09/15/17  1:30 PM  Result Value Ref Range   Prothrombin Time 14.3 11.4 - 15.2 seconds   INR 1.12   APTT     Status: Abnormal   Collection Time: 09/15/17  1:30 PM  Result Value Ref Range   aPTT 37 (H) 24 - 36 seconds    Comment:        IF BASELINE aPTT IS ELEVATED, SUGGEST PATIENT RISK ASSESSMENT BE USED TO DETERMINE APPROPRIATE ANTICOAGULANT THERAPY.    No results found.  Review of Systems  Constitutional: Negative.   HENT: Negative.   Eyes: Negative.   Respiratory: Negative.   Cardiovascular: Negative.   Gastrointestinal: Negative.   Genitourinary: Negative.   Musculoskeletal: Positive  for joint pain.  Skin: Negative.   Neurological: Negative.   Endo/Heme/Allergies: Negative.   Psychiatric/Behavioral: Negative.     Blood pressure (!) 163/80, pulse 73, temperature 98.1 F (36.7 C), temperature source Oral, resp. rate 18, height 6' (1.829 m), weight 95.3 kg (210 lb), SpO2 100 %. Physical Exam  Constitutional: He appears well-developed.  HENT:  Head: Normocephalic.  Neck: Normal range of motion.  Cardiovascular: Normal rate.  Respiratory: Effort normal.  GI: Soft.  Musculoskeletal:  Left Knee joint Effusion with slight increase in Temp.   Neurological: He is alert.  Skin: Skin is warm.  Psychiatric: He has a normal mood and affect.     Assessment/Plan Arthroscopic clean out of Left Knee.  [name], MD 09/15/2017, 3:36 PM

## 2017-09-15 NOTE — Progress Notes (Signed)
ANTICOAGULATION CONSULT NOTE - Initial Consult  Pharmacy Consult for coumadin Indication: VTE prophylaxis  Allergies  Allergen Reactions  . Lisinopril Anaphylaxis and Shortness Of Breath    Throat swelling  . Penicillins Other (See Comments)    Childhood allergy Has patient had a PCN reaction causing immediate rash, facial/tongue/throat swelling, SOB or lightheadedness with hypotension: Unk Has patient had a PCN reaction causing severe rash involving mucus membranes or skin necrosis: Unk Has patient had a PCN reaction that required hospitalization: Unk Has patient had a PCN reaction occurring within the last 10 years: No If all of the above answers are "NO", then may proceed with Cephalosporin use.     Patient Measurements: Height: 6' (182.9 cm) Weight: 210 lb (95.3 kg) IBW/kg (Calculated) : 77.6  Vital Signs: Temp: 98.6 F (37 C) (12/26 1835) Temp Source: Oral (12/26 1835) BP: 175/88 (12/26 1835) Pulse Rate: 68 (12/26 1835)  Labs: Recent Labs    09/15/17 1330  HGB 7.1*  HCT 21.0*  PLT 184  APTT 37*  LABPROT 14.3  INR 1.12  CREATININE 14.21*    Estimated Creatinine Clearance: 6.4 mL/min (A) (by C-G formula based on SCr of 14.21 mg/dL (H)).   Medical History: Past Medical History:  Diagnosis Date  . Anemia   . Anxiety   . Arthritis   . Asthma    per pt hx  . End stage renal disease on home HD 07/10/2011   Started HD in September 2012 at [facility] with a tunneled HD catheter, now on home HD with NxtStage. Dialyzing through AVF L lower arm with buttonhole technique as of mid 2014. His brother does the HD treatments at home.  They are roommates for 23 years.  The brother works 3rd shift and gets off about 8am and then puts Mr St[place] on HD in the morning after getting home. Most of the time he does HD about 4 times a week, for about 4 hours per treatment. Cause of ESRD was HTN according to patient. He says he let his health go and ending up with complications, and that he  didn't like seeing doctors in those days.  He says he was diagnosed with severe HTN when he lived in [place] in his 45's.   . Hepatitis B carrier ([place])   . HIV infection ([place])   . Hypertension   . Hyperthyroidism    normal now  . Pneumonia several yrs ago  . Seizure ([place])   . Seizures ([place]) 06/02/2011   x 1 none since  . Thrombocytopenia ([place])     Medications:  Medications Prior to Admission  Medication Sig Dispense Refill Last Dose  . albuterol (PROVENTIL) (2.5 MG/3ML) 0.083% nebulizer solution Take 2.5 mg by nebulization every 6 (six) hours as needed for wheezing or shortness of breath.   09/15/2017 at Unknown time  . ALPRAZolam (XANAX) 0.25 MG tablet Take 0.25 mg by mouth daily as needed for anxiety.   Past Week at Unknown time  . calcium acetate (PHOSLO) 667 MG capsule Take 667-2,001 mg by mouth See admin instructions. 1,334-2,001 mg three times a day with each meal and 667-1,334 mg with each snack   09/14/2017 at Unknown time  . carvedilol (COREG) 25 MG tablet Take 25 mg by mouth 2 (two) times daily with a meal.   09/15/2017 at 800 AM  . cloNIDine (CATAPRES - DOSED IN MG/24 HR) 0.1 mg/24hr patch UNWRAP AND APPLY 1 PATCH TO SKIN EVERY 7 DAYS (Patient taking differently: UNWRAP AND APPLY  1 PATCH TO SKIN EVERY 7 DAYS (Fridays)) 4 patch 2 09/15/2017 at Unknown time  . clotrimazole-betamethasone (LOTRISONE) cream Apply 1 application topically 2 (two) times daily. 30 g 0 Past Week at Unknown time  . doxazosin (CARDURA) 4 MG tablet Take 4 mg by mouth daily.  8 09/14/2017 at Unknown time  . doxycycline (ADOXA) 100 MG tablet Take 100 mg by mouth 2 (two) times daily.  0 09/15/2017 at Unknown time  . doxycycline (VIBRA-TABS) 100 MG tablet Take 100 mg by mouth 2 (two) times daily.   09/15/2017 at Unknown time  . epoetin alfa (EPOGEN,PROCRIT) 2000 UNIT/ML injection Inject 2,000 Units into the vein 3 (three) times a week.   09/13/2017 at Unknown time  . ethyl chloride spray Apply 1 application  topically daily as needed (HD). At HD  12 09/13/2017  . heparin 1000 UNIT/ML injection Inject 3,000 Units into the vein 3 (three) times a week.   09/13/2017  . hydrALAZINE (APRESOLINE) 100 MG tablet Take 100 mg by mouth 2 (two) times daily.    09/15/2017 at Unknown time  . Ibuprofen-Famotidine (DUEXIS) 800-26.6 MG TABS Take 1 tablet by mouth every 8 (eight) hours as needed (PAIN).   09/14/2017 at Unknown time  . ISENTRESS 400 MG tablet TAKE 1 TABLET(400 MG) BY MOUTH TWICE DAILY 60 tablet 3 09/15/2017 at Unknown time  . lamivudine (EPIVIR HBV) 5 MG/ML solution TAKE 5 ML BY MOUTH EVERY DAY (Patient taking differently: Take by mouth at bedtime. TAKE 5 ML BY MOUTH EVERY DAY) 240 mL 11 09/14/2017 at Unknown time  . multivitamin (RENA-VIT) TABS tablet Take 1 tablet by mouth daily.     09/15/2017 at Unknown time  . PROCTOZONE-HC 2.5 % rectal cream Place 1 application rectally 2 (two) times daily as needed.  6 09/13/2017  . tenofovir (VIREAD) 300 MG tablet TAKE 1 TABLET BY MOUTH EVERY SATURDAY AFTER DIALYSIS 5 tablet 4 09/11/2017 at Unknown time  . traZODone (DESYREL) 50 MG tablet Take 50 mg by mouth at bedtime as needed for sleep.   Past Week at Unknown time  . VOLTAREN 1 % GEL APPLY 2 GRAMS EXTERNALLY TO THE AFFECTED AREA FOUR TIMES DAILY 100 g 1 Past Week at Unknown time  . COMBIVENT RESPIMAT 20-100 MCG/ACT AERS respimat INHALE 1 PUFF BY MOUTH EVERY 4 HOURS AS NEEDED FOR WHEEZING OR SHORTNESS OF BREATH (Patient not taking: Reported on 09/15/2017) 4 g 0 Not Taking at Unknown time  . ibuprofen (ADVIL,MOTRIN) 800 MG tablet Take 1 tablet (800 mg total) by mouth every 8 (eight) hours as needed. Take with food (Patient not taking: Reported on 09/15/2017) 30 tablet 2 Not Taking at Unknown time  . oxyCODONE-acetaminophen (PERCOCET/ROXICET) 5-325 MG tablet Take 1 tablet by mouth every 6 (six) hours as needed for severe pain. (Patient not taking: Reported on 09/08/2017) 7 tablet 0 Not Taking at Unknown time     Assessment: 61 yo M s/p R TKR. Pharmacy consulted to dose coumadin for VTE px.   Goal of Therapy:  INR 2-3 Monitor platelets by anticoagulation protocol: Yes   Plan:  Coumadin 10 mg po x 1 dose tonight Daily INR Coumadin book and video ordered for 12/27 to start education  Eudelia Bunch, Pharm.D. [phone] 09/15/2017 7:07 PM

## 2017-09-15 NOTE — Interval H&P Note (Signed)
History and Physical Interval Note:  09/15/2017 3:45 PM  Jeffrey [name]  has presented today for surgery, with the diagnosis of Left knee infection  The various methods of treatment have been discussed with the patient and family. After consideration of risks, benefits and other options for treatment, the patient has consented to  Procedure(s): IRRIGATION AND DEBRIDEMENT KNEE; arthroscopic clean out (Left) as a surgical intervention .  The patient's history has been reviewed, patient examined, no change in status, stable for surgery.  I have reviewed the patient's chart and labs.  Questions were answered to the patient's satisfaction.     [name]

## 2017-09-15 NOTE — Anesthesia Procedure Notes (Signed)
Procedure Name: LMA Insertion Date/Time: 09/15/2017 4:15 PM Performed by: Montel Clock, CRNA Pre-anesthesia Checklist: Patient identified, Emergency Drugs available, Suction available, Patient being monitored and Timeout performed Patient Re-evaluated:Patient Re-evaluated prior to induction Oxygen Delivery Method: Circle system utilized Preoxygenation: Pre-oxygenation with 100% oxygen Induction Type: IV induction LMA: LMA with gastric port inserted LMA Size: 5.0 Number of attempts: 1 Dental Injury: Teeth and Oropharynx as per pre-operative assessment

## 2017-09-15 NOTE — Anesthesia Preprocedure Evaluation (Signed)
Anesthesia Evaluation  Patient identified by MRN, date of birth, ID band Patient awake    Reviewed: Allergy & Precautions, NPO status , Patient's Chart, lab work & pertinent test results  History of Anesthesia Complications Negative for: history of anesthetic complications  Airway Mallampati: II  TM Distance: >3 FB Neck ROM: Full    Dental  (+) Edentulous Upper, Edentulous Lower   Pulmonary neg shortness of breath, neg sleep apnea, COPD,  COPD inhaler, neg recent URI, former smoker,    breath sounds clear to auscultation       Cardiovascular hypertension, Pt. on medications  Rhythm:Regular     Neuro/Psych Seizures -, Well Controlled,  PSYCHIATRIC DISORDERS Anxiety    GI/Hepatic (+) Hepatitis -, B  Endo/Other  negative endocrine ROS  Renal/GU ESRF and DialysisRenal diseaseDialysis Mon     Musculoskeletal  (+) Arthritis ,   Abdominal   Peds  Hematology  (+) anemia , HIV,   Anesthesia Other Findings   Reproductive/Obstetrics                             Anesthesia Physical Anesthesia Plan  ASA: III  Anesthesia Plan: General   Post-op Pain Management:    Induction: Intravenous  PONV Risk Score and Plan: 2 and Ondansetron and Dexamethasone  Airway Management Planned: LMA  Additional Equipment: None  Intra-op Plan:   Post-operative Plan: Extubation in OR  Informed Consent: I have reviewed the patients History and Physical, chart, labs and discussed the procedure including the risks, benefits and alternatives for the proposed anesthesia with the patient or authorized representative who has indicated his/her understanding and acceptance.   Dental advisory given  Plan Discussed with: CRNA and Surgeon  Anesthesia Plan Comments:         Anesthesia Quick Evaluation

## 2017-09-15 NOTE — Transfer of Care (Signed)
Immediate Anesthesia Transfer of Care Note  Patient: Jeffrey [name]  Procedure(s) Performed: IRRIGATION AND DEBRIDEMENT KNEE; arthroscopic clean out (Left )  Patient Location: PACU  Anesthesia Type:General  Level of Consciousness: awake, oriented and patient cooperative  Airway & Oxygen Therapy: Patient Spontanous Breathing and Patient connected to face mask oxygen  Post-op Assessment: Report given to RN and Post -op Vital signs reviewed and stable  Post vital signs: Reviewed and stable  Last Vitals:  Vitals:   09/15/17 1321 09/15/17 1718  BP: (!) 163/80 (!) 169/85  Pulse: 73 70  Resp: 18 17  Temp: 36.7 C   SpO2: 100% 100%    Last Pain:  Vitals:   09/15/17 1321  TempSrc: Oral  PainSc: 8       Patients Stated Pain Goal: 4 (70/96/43 [id number])  Complications: No apparent anesthesia complications

## 2017-09-15 NOTE — Progress Notes (Addendum)
Pharmacy Antibiotic Note  Jeffrey [name] is a 61 y.o. male admitted on 09/15/2017 with surgical prophylaxis.  Pharmacy has been consulted for vancomycin dosing. Vanc 1 gm given preop op at 1530. ESRD on home HD.  12/10 L knee synovial fluid MR CNS. Vanc MIC = 1.   Plan: Give 2nd dose of vanc 1 gm to make a total dose for today = 2 gm for loading dose in ESRD HD pt ?? If need scheduled abx for septic joint?? If vanc to continue dose is 1000 mg IV after HD on HD days, MWF  Height: 6' (182.9 cm) Weight: 210 lb (95.3 kg) IBW/kg (Calculated) : 77.6  Temp (24hrs), Avg:98.2 F (36.8 C), Min:97.7 F (36.5 C), Max:98.6 F (37 C)  Recent Labs  Lab 09/15/17 1330  WBC 9.7  CREATININE 14.21*    Estimated Creatinine Clearance: 6.4 mL/min (A) (by C-G formula based on SCr of 14.21 mg/dL (H)).    Allergies  Allergen Reactions  . Lisinopril Anaphylaxis and Shortness Of Breath    Throat swelling  . Penicillins Other (See Comments)    Childhood allergy Has patient had a PCN reaction causing immediate rash, facial/tongue/throat swelling, SOB or lightheadedness with hypotension: Unk Has patient had a PCN reaction causing severe rash involving mucus membranes or skin necrosis: Unk Has patient had a PCN reaction that required hospitalization: Unk Has patient had a PCN reaction occurring within the last 10 years: No If all of the above answers are "NO", then may proceed with Cephalosporin use.     Thank you for allowing pharmacy to be a part of this patient's care. Eudelia Bunch, Pharm.D. [phone] 09/15/2017 7:30 PM

## 2017-09-15 NOTE — Progress Notes (Addendum)
Patient is transferred from PACU at [facility]; alert and oriented x4; pain in R knee (surgery incision with wound vac: intact); dressing dry and intact, no bleeding. Vital sign was taken. Will monitor patient as protocol

## 2017-09-16 ENCOUNTER — Encounter (HOSPITAL_COMMUNITY): Payer: Self-pay | Admitting: Orthopedic Surgery

## 2017-09-16 ENCOUNTER — Inpatient Hospital Stay (HOSPITAL_COMMUNITY): Payer: Medicare Other

## 2017-09-16 ENCOUNTER — Telehealth: Payer: Self-pay | Admitting: *Deleted

## 2017-09-16 DIAGNOSIS — M01X0 Direct infection of unspecified joint in infectious and parasitic diseases classified elsewhere: Secondary | ICD-10-CM

## 2017-09-16 DIAGNOSIS — B20 Human immunodeficiency virus [HIV] disease: Secondary | ICD-10-CM

## 2017-09-16 DIAGNOSIS — I509 Heart failure, unspecified: Secondary | ICD-10-CM | POA: Diagnosis not present

## 2017-09-16 DIAGNOSIS — N186 End stage renal disease: Secondary | ICD-10-CM

## 2017-09-16 DIAGNOSIS — D631 Anemia in chronic kidney disease: Secondary | ICD-10-CM | POA: Diagnosis not present

## 2017-09-16 DIAGNOSIS — Z992 Dependence on renal dialysis: Secondary | ICD-10-CM

## 2017-09-16 DIAGNOSIS — M009 Pyogenic arthritis, unspecified: Principal | ICD-10-CM

## 2017-09-16 DIAGNOSIS — D509 Iron deficiency anemia, unspecified: Secondary | ICD-10-CM | POA: Diagnosis not present

## 2017-09-16 DIAGNOSIS — N2581 Secondary hyperparathyroidism of renal origin: Secondary | ICD-10-CM | POA: Diagnosis not present

## 2017-09-16 DIAGNOSIS — Z792 Long term (current) use of antibiotics: Secondary | ICD-10-CM

## 2017-09-16 DIAGNOSIS — M00062 Staphylococcal arthritis, left knee: Secondary | ICD-10-CM

## 2017-09-16 HISTORY — PX: IR US GUIDE VASC ACCESS RIGHT: IMG2390

## 2017-09-16 HISTORY — PX: IR FLUORO GUIDE CV LINE RIGHT: IMG2283

## 2017-09-16 LAB — BASIC METABOLIC PANEL
Anion gap: 14 (ref 5–15)
BUN: 96 mg/dL — AB (ref 6–20)
CALCIUM: 8 mg/dL — AB (ref 8.9–10.3)
CHLORIDE: 97 mmol/L — AB (ref 101–111)
CO2: 21 mmol/L — ABNORMAL LOW (ref 22–32)
CREATININE: 15.46 mg/dL — AB (ref 0.61–1.24)
GFR calc non Af Amer: 3 mL/min — ABNORMAL LOW (ref >60)
GFR, EST AFRICAN AMERICAN: 3 mL/min — AB (ref >60)
Glucose, Bld: 122 mg/dL — ABNORMAL HIGH (ref 65–99)
Potassium: 5.4 mmol/L — ABNORMAL HIGH (ref 3.5–5.1)
SODIUM: 132 mmol/L — AB (ref 135–145)

## 2017-09-16 LAB — PROTIME-INR
INR: 1.19
PROTHROMBIN TIME: 15 s (ref 11.4–15.2)

## 2017-09-16 LAB — CK: Total CK: 18 U/L — ABNORMAL LOW (ref 49–397)

## 2017-09-16 MED ORDER — SODIUM CHLORIDE 0.9 % IV SOLN: 8.0000 mg/kg | INTRAVENOUS | Status: DC

## 2017-09-16 MED ORDER — LIDOCAINE HCL 1 % IJ SOLN
INTRAMUSCULAR | Status: AC
  Filled 2017-09-16: qty 20

## 2017-09-16 MED ORDER — FENTANYL CITRATE (PF) 100 MCG/2ML IJ SOLN
INTRAMUSCULAR | Status: AC
  Filled 2017-09-16: qty 2

## 2017-09-16 MED ORDER — LIDOCAINE HCL 1 % IJ SOLN
INTRAMUSCULAR | Status: DC | PRN
  Administered 2017-09-16: 5 mL via INTRADERMAL

## 2017-09-16 MED ORDER — ASPIRIN 325 MG PO TABS: 325.0000 mg | ORAL_TABLET | Freq: Two times a day (BID) | ORAL | 0 refills | Status: DC

## 2017-09-16 MED ORDER — HYDROCODONE-ACETAMINOPHEN 10-325 MG PO TABS: 1.0000 | ORAL_TABLET | Freq: Four times a day (QID) | ORAL | 0 refills | Status: DC | PRN

## 2017-09-16 MED ORDER — FENTANYL CITRATE (PF) 100 MCG/2ML IJ SOLN: 50.0000 ug | Freq: Once | INTRAMUSCULAR | Status: DC

## 2017-09-16 MED ORDER — METHOCARBAMOL 500 MG PO TABS: 500.0000 mg | ORAL_TABLET | Freq: Three times a day (TID) | ORAL | 1 refills | Status: DC | PRN

## 2017-09-16 NOTE — Progress Notes (Signed)
Subjective: 1 Day Post-Op Procedure(s) (LRB): IRRIGATION AND DEBRIDEMENT KNEE; arthroscopic clean out (Left) Patient reports pain as 1 on 0-10 scale.Will have Infectious Disease evaluate. He is on Home Dialysis and will need IV antibiotics,His drain was DCd.He has Chronic Anemia.    Objective: Vital signs in last 24 hours: Temp:  [97.6 F (36.4 C)-98.6 F (37 C)] 98 F (36.7 C) (12/27 0458) Pulse Rate:  [64-79] 79 (12/27 0458) Resp:  [10-18] 18 (12/27 0458) BP: (146-175)/(62-88) 148/80 (12/27 0458) SpO2:  [100 %] 100 % (12/27 0458) Weight:  [95.3 kg (210 lb)] 95.3 kg (210 lb) (12/26 1321)  Intake/Output from previous day: 12/26 0701 - 12/27 0700 In: 541.5 [I.V.:541.5] Out: 25 [Blood:25] Intake/Output this shift: No intake/output data recorded.  Recent Labs    09/15/17 1330  HGB 7.1*   Recent Labs    09/15/17 1330  WBC 9.7  RBC 2.10*  HCT 21.0*  PLT 184   Recent Labs    09/15/17 1330 09/16/17 0514  NA 139 132*  K 4.9 5.4*  CL 101 97*  CO2 22 21*  BUN 88* 96*  CREATININE 14.21* 15.46*  GLUCOSE 98 122*  CALCIUM 8.3* 8.0*   Recent Labs    09/15/17 1330 09/16/17 0514  INR 1.12 1.19    Neurologically intact Dorsiflexion/Plantar flexion intact Compartment soft  Assessment/Plan: 1 Day Post-Op Procedure(s) (LRB): IRRIGATION AND DEBRIDEMENT KNEE; arthroscopic clean out (Left) Up with therapy  Jeffrey [name] 09/16/2017, 7:35 AM

## 2017-09-16 NOTE — Progress Notes (Signed)
PHARMACY CONSULT NOTE FOR:  OUTPATIENT  PARENTERAL ANTIBIOTIC THERAPY (OPAT)  Indication: septic joint Regimen: Daptomycin 760 mg (8 mg/kg) IV q48h. Give after HD on HD days. End date: 10/14/17  IV antibiotic discharge orders are pended. To discharging provider:  please sign these orders via discharge navigator,  Select New Orders & click on the button choice - Manage This Unsigned Work.     Thank you for allowing pharmacy to be a part of this patient's care.  Jeffrey [name] 09/16/2017, 2:39 PM

## 2017-09-16 NOTE — Evaluation (Signed)
Physical Therapy Evaluation Patient Details Name: Jeffrey [name] MRN: [id number] DOB: Aug 04, 1956 Today's Date: 09/16/2017   History of Present Illness  admitted 09/15/17 for I and D /arthroscopy for septic left knee. H/O ESRD, Hep c, HIV, Seizures.  Clinical Impression  The patient ambulated x 30' with RW. Plans Dc home. Brother is primary caregiver and present. Pt admitted with above diagnosis. Pt currently with functional limitations due to the deficits listed below (see PT Problem List).  Pt will benefit from skilled PT to increase their independence and safety with mobility to allow discharge to the venue listed below.       Follow Up Recommendations Home health PT    Equipment Recommendations  None recommended by PT    Recommendations for Other Services       Precautions / Restrictions Precautions Precautions: Fall;Knee Restrictions Weight Bearing Restrictions: No      Mobility  Bed Mobility Overal bed mobility: Needs Assistance Bed Mobility: Supine to Sit;Sit to Supine     Supine to sit: Min guard     General bed mobility comments: self assist the left leg with right  Transfers Overall transfer level: Needs assistance Equipment used: Rolling walker (2 wheeled) Transfers: Sit to/from Stand Sit to Stand: Min assist         General transfer comment: cues for hand and left position  Ambulation/Gait Ambulation/Gait assistance: Min assist Ambulation Distance (Feet): 30 Feet Assistive device: Rolling walker (2 wheeled) Gait Pattern/deviations: Step-to pattern;Antalgic     General Gait Details: cues for sequence  Stairs            Wheelchair Mobility    Modified Rankin (Stroke Patients Only)       Balance                                             Pertinent Vitals/Pain Pain Assessment: 0-10 Pain Score: 6  Pain Location: was 1 then increased with weight Pain Descriptors / Indicators: Discomfort;Grimacing;Aching Pain  Intervention(s): Monitored during session;Premedicated before session;Repositioned;Ice applied    Home Living Family/patient expects to be discharged to:: Private residence Living Arrangements: Other relatives Available Help at Discharge: Family Type of Home: House Home Access: Stairs to enter   Technical brewer of Steps: 3 Home Layout: Able to live on main level with bedroom/bathroom;Two level Home Equipment: Cane - single point;Walker - 2 wheels Additional Comments: Home Hemodialisis    Prior Function Level of Independence: Independent with assistive device(s)               Hand Dominance        Extremity/Trunk Assessment   Upper Extremity Assessment Upper Extremity Assessment: Overall WFL for tasks assessed    Lower Extremity Assessment Lower Extremity Assessment: LLE deficits/detail LLE Deficits / Details: able to perform SLR, knee flexion 10-50    Cervical / Trunk Assessment Cervical / Trunk Assessment: Normal  Communication   Communication: No difficulties  Cognition Arousal/Alertness: Awake/alert Behavior During Therapy: WFL for tasks assessed/performed Overall Cognitive Status: Within Functional Limits for tasks assessed                                        General Comments      Exercises     Assessment/Plan    PT Assessment Patient  needs continued PT services  PT Problem List Decreased strength;Decreased range of motion;Decreased activity tolerance;Decreased mobility;Decreased knowledge of precautions;Decreased safety awareness;Pain;Decreased knowledge of use of DME       PT Treatment Interventions DME instruction;Gait training;Stair training;Functional mobility training;Therapeutic activities;Therapeutic exercise;Patient/family education    PT Goals (Current goals can be found in the Care Plan section)  Acute Rehab PT Goals Patient Stated Goal: to go home PT Goal Formulation: With patient/family Time For Goal  Achievement: 09/23/17 Potential to Achieve Goals: Good    Frequency     Barriers to discharge        Co-evaluation               AM-PAC PT "6 Clicks" Daily Activity  Outcome Measure Difficulty turning over in bed (including adjusting bedclothes, sheets and blankets)?: A Little Difficulty moving from lying on back to sitting on the side of the bed? : A Little Difficulty sitting down on and standing up from a chair with arms (e.g., wheelchair, bedside commode, etc,.)?: A Little Help needed moving to and from a bed to chair (including a wheelchair)?: A Little Help needed walking in hospital room?: A Little Help needed climbing 3-5 steps with a railing? : A Lot 6 Click Score: 17    End of Session Equipment Utilized During Treatment: Gait belt   Patient left: in bed;with call bell/phone within reach;with family/visitor present Nurse Communication: Mobility status PT Visit Diagnosis: Difficulty in walking, not elsewhere classified (R26.2);Pain Pain - part of body: Knee    Time: 0837-0900 PT Time Calculation (min) (ACUTE ONLY): 23 min   Charges:   PT Evaluation $PT Eval Low Complexity: 1 Low PT Treatments $Gait Training: 8-22 mins   PT G CodesTresa Endo PT [phone]  Claretha Cooper 09/16/2017, 10:49 AM

## 2017-09-16 NOTE — Telephone Encounter (Signed)
Patient called to let us know he is in the hospital. He's at [facility] for I&D of his knee. Landis Gandy, RN

## 2017-09-16 NOTE — Op Note (Signed)
NAME:  Jeffrey [name], Jeffrey [name]                      ACCOUNT NO.:  MEDICAL RECORD NO.:  [id number]  LOCATION:                                 FACILITY:  PHYSICIAN:  Shree Espey A. Francis Yardley, M.D.DATE OF BIRTH:  1955-11-27  DATE OF PROCEDURE:  09/15/2017 DATE OF DISCHARGE:                              OPERATIVE REPORT   SURGEON:  Kipp Brood. Gladstone Lighter, M.D.  OPERATIVE ASSISTANT:  The OR tech.  PREOPERATIVE DIAGNOSES: 1. Septic left knee. 2. Human immunodeficiency virus positive. 3. Partial renal failure and he is on home dialysis.  POSTOPERATIVE DIAGNOSES: 1. Septic left knee. 2. Human immunodeficiency virus positive. 3. Partial renal failure and he is on home dialysis.  OPERATION:  Arthroscopic debridement and irrigation of his left knee.  DESCRIPTION OF PROCEDURE:  Under general anesthesia, routine orthopedic prep and draping was carried out of the left lower extremity. Appropriate time-out was carried out, also marked the appropriate left leg in the holding area.  At this time, an incision was made in the lateral aspect of the suprapatellar pouch.  The inflow cannula was inserted.  Immediately upon inserting the catheter, purulent material came out.  We took cultures for aerobic, anaerobic, and Gram stain. Another site was selected inferiorly and another tube was inserted and we irrigated the knee out utilizing the pump.  I irrigated with 3000 mL of fluid until the fluid was totally clear.  Note, the fluid cleared up immediately once we removed the purulent material.  We continued to keep irrigating this.  I did not have to do an arthroscopic debridement on this.  I thoroughly irrigated out the area.  I then inserted a Hemovac drain through the superior tube, pulled the tube out, and sutured the Hemovac drain in place.  The other 2 wound sites were sutured.  Sterile dressings were applied.  The knee was returned to the recovery room. Right after the cultures were taken, he was given 1 g of  vancomycin. The plan is I will have him on aspirin 325 mg b.i.d.  I will have the Infectious Disease doctor see him as well.          ______________________________ Kipp Brood. Gladstone Lighter, M.D.    RAG/MEDQ  D:  09/15/2017  T:  09/15/2017  Job:  [id number]

## 2017-09-16 NOTE — Consult Note (Addendum)
[place] for Infectious Disease  Total days of antibiotics 2        Day 2 vanco               Reason for Consult: septic arthritis   Referring Physician: gioffre  Active Problems:   Septic joint of left knee joint ([place])    HPI: Jeffrey [name] is a 61 y.o. male well controlled hiv disease, CD4 count of 460/viral load undetectable (may 2018), on raltegravir- lamivudine-weekly tenofovir, ESRD with home HD and also hx of AR, with hx of MSSE bacteremia in Fall 2017. He started to have left knee pain and effusion in early December, which was aspirated on 12/10. Cell count of 15K, with 91% N, culture showing rare MRSE. He was initially placed on doxycycline and offered I x D but declined at the time due to work responsibilities. He was readmitted yesterday due to worsening symptoms. Dr Gladstone Lighter reports that he quickly aspirated 21m purulent material from joint and lavaged roughly 3L to the area. Currently on vancomycin. ID asked to weigh in on abtx management  [place] [facility] [place] Past Medical History:  Diagnosis Date  . Anemia   . Anxiety   . Arthritis   . Asthma    per pt hx  . End stage renal disease on home HD 07/10/2011   [facility] now on home HD with Nxt[facility]. Dialyzing through AVF L lower arm with buttonhole technique as of mid 2014. His brother does the HD treatments at home.  They are roommates for 23 years.  The brother works 3rd shift and gets off about 8am and then puts Jeffrey [name] HD in the morning after getting home. Most of the time he does HD about 4 times a week, for about 4 hours per treatment. Cause of ESRD was HTN according to patient. He says he let his health go and ending up with complications, and that he didn't like seeing doctors in those days.  [place]   . Hepatitis B carrier ([place]   . HIV infection ([place]     . Hypertension   . Hyperthyroidism    normal now  . Pneumonia several yrs ago  . Seizure ([place]   . Seizures ([place] 06/02/2011   x 1 none since  . Thrombocytopenia ([place]     Allergies:  Allergies  Allergen Reactions  . Lisinopril Anaphylaxis and Shortness Of Breath    Throat swelling  . Penicillins Other (See Comments)    Childhood allergy Has patient had a PCN reaction causing immediate rash, facial/tongue/throat swelling, SOB or lightheadedness with hypotension: Unk Has patient had a PCN reaction causing severe rash involving mucus membranes or skin necrosis: Unk Has patient had a PCN reaction that required hospitalization: Unk Has patient had a PCN reaction occurring within the last 10 years: No If all of the above answers are "NO", then may proceed with Cephalosporin use.     MEDICATIONS: . aspirin  325 mg Oral BID  . carvedilol  25 mg Oral BID WC  . [START ON 09/22/2017] cloNIDine  0.1 mg Transdermal Weekly  . [START ON 09/20/2017] Darbepoetin Alfa  25 mcg Subcutaneous Q Mon-HD  . doxazosin  4 mg Oral Daily  . ferrous sulfate  325 mg Oral TID PC  . [START  ON 09/17/2017] heparin  3,000 Units Intravenous Q M,W,F-HD  . hydrALAZINE  100 mg Oral BID  . lamiVUDine  25 mg Oral QHS  . patient's guide to using coumadin book   Does not apply Once  . raltegravir  400 mg Oral BID  . [START ON 09/18/2017] tenofovir  300 mg Oral Q Sat-HD    Social History   Tobacco Use  . Smoking status: Former Smoker    Years: 10.00    Types: Cigarettes    Last attempt to quit: 08/08/2015    Years since quitting: 2.1  . Smokeless tobacco: Never Used  . Tobacco comment: casual smoking for 10 years  Substance Use Topics  . Alcohol use: No    Alcohol/week: 0.0 oz    Comment: quit 2004  . Drug use: No    Comment: uds (+) cocaine in 08/2011 but pt states was taking sudafed at the time    Family History  Problem Relation Age of Onset  . Hypertension Mother   . Diabetes Mother   . Cancer  Father     Review of Systems -   Constitutional: Negative for fever, chills, diaphoresis, activity change, appetite change, fatigue and unexpected weight change.  HENT: Negative for congestion, sore throat, rhinorrhea, sneezing, trouble swallowing and sinus pressure.  Eyes: Negative for photophobia and visual disturbance.  Respiratory: Negative for cough, chest tightness, shortness of breath, wheezing and stridor.  Cardiovascular: Negative for chest pain, palpitations and leg swelling.  Gastrointestinal: Negative for nausea, vomiting, abdominal pain, diarrhea, constipation, blood in stool, abdominal distention and anal bleeding.  Genitourinary: Negative for dysuria, hematuria, flank pain and difficulty urinating.  Musculoskeletal: left knee pain Skin: Negative for color change, pallor, rash and wound.  Neurological: Negative for dizziness, tremors, weakness and light-headedness.  Hematological: Negative for adenopathy. Does not bruise/bleed easily.  Psychiatric/Behavioral: Negative for behavioral problems, confusion, sleep disturbance, dysphoric mood, decreased concentration and agitation.     OBJECTIVE: Temp:  [97.6 F (36.4 C)-98.6 F (37 C)] 98 F (36.7 C) (12/27 0458) Pulse Rate:  [64-79] 79 (12/27 0458) Resp:  [10-18] 18 (12/27 0458) BP: (146-175)/(62-88) 148/80 (12/27 0458) SpO2:  [100 %] 100 % (12/27 0458) Weight:  [210 lb (95.3 kg)] 210 lb (95.3 kg) (12/26 1321) Physical Exam  Constitutional: He is oriented to person, place, and time. He appears well-developed and well-nourished. No distress.  HENT:  Mouth/Throat: Oropharynx is clear and moist. No oropharyngeal exudate.  Cardiovascular: Normal rate, regular rhythm and normal heart sounds. 2/6 sem Pulmonary/Chest: Effort normal and breath sounds normal. No respiratory distress. He has no wheezes.  Abdominal: Soft. Bowel sounds are normal. He exhibits no distension. There is no tenderness.  Lymphadenopathy:  He has no  cervical adenopathy.  Neurological: He is alert and oriented to person, place, and time.  Ext: left knee swelling, decrease Skin: Skin is warm and dry. Slight warmth Psychiatric: He has a normal mood and affect. His behavior is normal.    LABS: Results for orders placed or performed during the hospital encounter of 09/15/17 (from the past 48 hour(s))  CBC WITH DIFFERENTIAL     Status: Abnormal   Collection Time: 09/15/17  1:30 PM  Result Value Ref Range   WBC 9.7 4.0 - 10.5 K/uL   RBC 2.10 (L) 4.22 - 5.81 MIL/uL   Hemoglobin 7.1 (L) 13.0 - 17.0 g/dL   HCT 21.0 (L) 39.0 - 52.0 %   MCV 100.0 78.0 - 100.0 fL   MCH 33.8 26.0 -  34.0 pg   MCHC 33.8 30.0 - 36.0 g/dL   RDW 14.0 11.5 - 15.5 %   Platelets 184 150 - 400 K/uL   Neutrophils Relative % 78 %   Neutro Abs 7.6 1.7 - 7.7 K/uL   Lymphocytes Relative 12 %   Lymphs Abs 1.1 0.7 - 4.0 K/uL   Monocytes Relative 7 %   Monocytes Absolute 0.7 0.1 - 1.0 K/uL   Eosinophils Relative 3 %   Eosinophils Absolute 0.3 0.0 - 0.7 K/uL   Basophils Relative 0 %   Basophils Absolute 0.0 0.0 - 0.1 K/uL  Comprehensive metabolic panel     Status: Abnormal   Collection Time: 09/15/17  1:30 PM  Result Value Ref Range   Sodium 139 135 - 145 mmol/L   Potassium 4.9 3.5 - 5.1 mmol/L   Chloride 101 101 - 111 mmol/L   CO2 22 22 - 32 mmol/L   Glucose, Bld 98 65 - 99 mg/dL   BUN 88 (H) 6 - 20 mg/dL   Creatinine, Ser 14.21 (H) 0.61 - 1.24 mg/dL   Calcium 8.3 (L) 8.9 - 10.3 mg/dL   Total Protein 6.8 6.5 - 8.1 g/dL   Albumin 3.1 (L) 3.5 - 5.0 g/dL   AST 14 (L) 15 - 41 U/L   ALT 7 (L) 17 - 63 U/L   Alkaline Phosphatase 54 38 - 126 U/L   Total Bilirubin 0.7 0.3 - 1.2 mg/dL   GFR calc non Af Amer 3 (L) >60 mL/min   GFR calc Af Amer 4 (L) >60 mL/min    Comment: (NOTE) The eGFR has been calculated using the CKD EPI equation. This calculation has not been validated in all clinical situations. eGFR's persistently <60 mL/min signify possible Chronic  Kidney Disease.    Anion gap 16 (H) 5 - 15  Protime-INR     Status: None   Collection Time: 09/15/17  1:30 PM  Result Value Ref Range   Prothrombin Time 14.3 11.4 - 15.2 seconds   INR 1.12   APTT     Status: Abnormal   Collection Time: 09/15/17  1:30 PM  Result Value Ref Range   aPTT 37 (H) 24 - 36 seconds    Comment:        IF BASELINE aPTT IS ELEVATED, SUGGEST PATIENT RISK ASSESSMENT BE USED TO DETERMINE APPROPRIATE ANTICOAGULANT THERAPY.   Body fluid culture     Status: None (Preliminary result)   Collection Time: 09/15/17  4:38 PM  Result Value Ref Range   Specimen Description SYNOVIAL KNEE LEFT FLUID    Special Requests NONE    Gram Stain      ABUNDANT WBC PRESENT, PREDOMINANTLY PMN NO ORGANISMS SEEN Gram Stain Report Called to,Read Back By and Verified With: MICKLEY,H @ [id number] ON [id number] BY POTEAT,S    Culture PENDING    Report Status PENDING   Basic metabolic panel     Status: Abnormal   Collection Time: 09/16/17  5:14 AM  Result Value Ref Range   Sodium 132 (L) 135 - 145 mmol/L    Comment: DELTA CHECK NOTED   Potassium 5.4 (H) 3.5 - 5.1 mmol/L   Chloride 97 (L) 101 - 111 mmol/L   CO2 21 (L) 22 - 32 mmol/L   Glucose, Bld 122 (H) 65 - 99 mg/dL   BUN 96 (H) 6 - 20 mg/dL   Creatinine, Ser 15.46 (H) 0.61 - 1.24 mg/dL   Calcium 8.0 (L) 8.9 - 10.3 mg/dL   GFR  calc non Af Amer 3 (L) >60 mL/min   GFR calc Af Amer 3 (L) >60 mL/min    Comment: (NOTE) The eGFR has been calculated using the CKD EPI equation. This calculation has not been validated in all clinical situations. eGFR's persistently <60 mL/min signify possible Chronic Kidney Disease.    Anion gap 14 5 - 15  Protime-INR     Status: None   Collection Time: 09/16/17  5:14 AM  Result Value Ref Range   Prothrombin Time 15.0 11.4 - 15.2 seconds   INR 1.19     MICRO: 12/10 aspirate- rare CoNS (S vanco, doxy, gent, cipro) 12/26 OR aspirate - pending  IMAGING: No results found.  Assessment/Plan:  61yo M  with hx of well controlled hiv disease, ESRD on home dialysis 4x per week admitted for septic arthritis to left knee with CoNS  - will need IR to place central line for daptomycin 31m/kg QOD, will get baseline ck. Will plan for 4 weeks of treatment - will need to follow up on his OR cultures from yesterday to decide if need to addn or change abtx regimen - will need baseline blood cx to ensure he does not have systemic infection, though he does not subscribe to symptoms to suggest this at this time - if blood cx are positive, then we will need to pursue TEE to evaluate for endocarditis.  hiv disease = continue on his home regimen  ESRD = needs nephrology to get him set up for dialysis while hospitalized.  Orders for home health listed below  Diagnosis: Septic arthritis  Culture Result: staph epi -MRSE  Allergies  Allergen Reactions  . Lisinopril Anaphylaxis and Shortness Of Breath    Throat swelling  . Penicillins Other (See Comments)    Childhood allergy Has patient had a PCN reaction causing immediate rash, facial/tongue/throat swelling, SOB or lightheadedness with hypotension: Unk Has patient had a PCN reaction causing severe rash involving mucus membranes or skin necrosis: Unk Has patient had a PCN reaction that required hospitalization: Unk Has patient had a PCN reaction occurring within the last 10 years: No If all of the above answers are "NO", then may proceed with Cephalosporin use.     OPAT Orders Discharge antibiotics: Per pharmacy protocol  daptomycin 877mkg  Duration: 4 wk End Date: Jan 24th  [place] Protocol:  Labs weekly while on IV antibiotics: _x_ CBC with differential  __x CRP _x_ ESR __x CK  _x_ Please pull PIC at completion of IV antibiotics-needs to be arranged thru IR   Fax weekly labs to [phone]  Clinic Follow Up Appt: Dr hatcher or Anjalee Cope   @ 4 wk

## 2017-09-16 NOTE — Procedures (Signed)
Pre procedural Diagnosis: Poor venous access Post Procedural Diagnosis: Same  Successful placement of right IJ approach 25 cm dual lumen tunneled PICC line with tip at the superior caval-atrial junction.    EBL: None  No immediate post procedural complication.  The PICC line is ready for immediate use.  Ronny Bacon, MD Pager #: [phone]

## 2017-09-16 NOTE — Progress Notes (Signed)
Pharmacy Antibiotic Note  Jeffrey [name] is a 61 y.o. male admitted on 09/15/2017 with septic left knee joint, previous synovial fluid culture grew MRSE.  Pharmacy has been consulted for daptomycin dosing in setting of HIV disease controlled on HAART and ESRD on home HD 3-4x per week.  Patient is s/p OR 12/26 where 30 mL of purulent material was aspirated from joint (sent for culture) and area lavaged.  Patient received Vancomycin 1g pre-op and 1g shortly post-op for an adequate 2g vancomycin loading dose on 12/26.  ID saw patient today and switching therapy to Daptomycin for now, following up OR cultures to adjusted if needed.  Duration of therapy 4 weeks per ID.  Ortho planning discharge home in next day or so.  Plan: Daptomycin 8 mg/kg q48h, given after HD session.  Next HD session should be tomorrow, will need to re-time daptomycin dose accordingly.  Patient currently covered today following Vancomycin load yesterday evening. Baseline CK ordered. Monitor weekly.  Height: 6' (182.9 cm) Weight: 210 lb (95.3 kg) IBW/kg (Calculated) : 77.6  Temp (24hrs), Avg:98.2 F (36.8 C), Min:97.6 F (36.4 C), Max:98.6 F (37 C)  Recent Labs  Lab 09/15/17 1330 09/16/17 0514  WBC 9.7  --   CREATININE 14.21* 15.46*    Estimated Creatinine Clearance: 5.9 mL/min (A) (by C-G formula based on SCr of 15.46 mg/dL (H)).    Allergies  Allergen Reactions  . Lisinopril Anaphylaxis and Shortness Of Breath    Throat swelling  . Penicillins Other (See Comments)    Childhood allergy Has patient had a PCN reaction causing immediate rash, facial/tongue/throat swelling, SOB or lightheadedness with hypotension: Unk Has patient had a PCN reaction causing severe rash involving mucus membranes or skin necrosis: Unk Has patient had a PCN reaction that required hospitalization: Unk Has patient had a PCN reaction occurring within the last 10 years: No If all of the above answers are "NO", then may proceed with  Cephalosporin use.     Antimicrobials this admission: 12/26 Vancomycin given pre/post op 12/28 Daptomycin >>  Dose adjustments this admission:  Microbiology results: 12/26 L knee synovial fluid: ngtd 12/27 BCx:   Previous encounter: 12/10 L knee synovial fluid MR CNS.  Thank you for allowing pharmacy to be a part of this patient's care.  Hershal Coria 09/16/2017 1:45 PM

## 2017-09-16 NOTE — Discharge Instructions (Signed)
Tunneled Catheter Insertion Tunneled catheter insertion is a procedure to insert a thin, flexible tube (catheter) into a vein. The catheter makes it easier to draw blood, give blood products, remove waste products from the blood (hemodialysis), and give medicines. This procedure is usually done when the bloodstream needs to be accessed many times over a long period of time. The procedure may vary among health care providers and hospitals. Typically, a tunnel catheter is inserted in the neck, chest, or groin. During the procedure, the catheter is tunneled under the skin. This means that the catheter is inserted under the skin and then moved away from the insertion site. Placing the catheter a few inches away from the insertion site reduces the risk of infection. Tell a health care provider about:  Any allergies you have.  All medicines you are taking, including vitamins, herbs, eye drops, creams, and over-the-counter medicines.  Any problems you or family members have had with anesthetic medicines.  Any blood disorders you have.  Any surgeries you have had.  Any medical conditions you have.  Whether you are pregnant or may be pregnant. What are the risks? Generally, this is a safe procedure. However, problems may occur, including:  Infection.  Bleeding.  Allergic reactions to medicines.  Damage to surrounding structures or organs.  Damage to the blood vessel that the catheter is inserted into.  Insertion of the catheter into the wrong blood vessel.  Development of: ? A kink, hole, or crack in the catheter. ? An air bubble in the catheter.  Blockage of the catheter.  Blood clots near the catheter.  Irregular heartbeat (rare).  Lung collapse (rare).  What happens before the procedure?  Ask your health care provider about: ? Changing or stopping your regular medicines. This is especially important if you are taking diabetes medicines or blood thinners. ? Taking medicines  such as aspirin and ibuprofen. These medicines can thin your blood. Do not take these medicines before your procedure if your health care provider instructs you not to.  Follow instructions from your health care provider about eating or drinking restrictions.  Ask your health care provider how your surgical site will be marked or identified.  You may be given antibiotic medicine to help prevent infection.  You may have a blood or urine sample taken.  Plan to have someone take you home after the procedure.  If you will be going home right after the procedure, plan to have someone with you for 24 hours. What happens during the procedure?  To reduce your risk of infection: ? Your health care team will wash or sanitize their hands. ? Your skin will be washed with soap. ? Hair may be removed from your surgical area.  An IV tube will be inserted into one of your veins.  You will be given one or more of the following: ? A medicine to help you relax (sedative). ? A medicine to make you fall asleep (general anesthetic). ? A medicine that is injected into your spine to numb the area below and slightly above the injection site (spinal anesthetic). ? A medicine that is injected into an area of your body to numb everything below the injection site (regional anesthetic).  An ultrasound of your insertion site may be done. An ultrasound is a test that uses sound waves to make an image of your insertion site. This helps your surgeon see your blood vessels more clearly.  Two incisions will be made at your insertion site. Usually, the  incisions are a few inches away from each other.  A tunnel will be made under your skin, between your two incisions.  A small wire (guidewire) will be inserted into your vein. The guidewire will help to move the catheter into your vein.  The catheter will be inserted into your vein and threaded through the tunnel under your skin. ? Your surgeon may use X-ray equipment  to watch the catheter being inserted. This allows your surgeon to watch the catheter on a live display while guiding it into place. ? The cuff of the catheter will remain in the tunnel under your skin. The cuff helps to keep the catheter in place.  Stitches (sutures) will be used to partially close your incision and to hold your catheter in place where it exits your body.  An X-ray may be done to make sure your catheter is in the right place.  Caps will be screwed onto the ends of the catheter that are outside of your body.  The incision that was used to insert your catheter may be closed with sutures, skin glue, or adhesive strips.  Bandages (dressings) will be placed over your incisions. The procedure may vary among health care providers and hospitals. What happens after the procedure?  Your blood pressure, heart rate, breathing rate, and blood oxygen level will be monitored often until the medicines you were given have worn off.  You may continue to receive fluids and medicines through an IV tube.  You may have pain and swelling at your insertion site. Medicines will be available to help you.  You may have to wear compression stockings. These stockings help to prevent blood clots and reduce swelling in your legs.  You will be encouraged to move around as much as possible.  Do not drive for 24 hours if you received a sedative. This information is not intended to replace advice given to you by your health care provider. Make sure you discuss any questions you have with your health care provider. Document Released: 09/27/2007 Document Revised: 11/12/2016 Document Reviewed: 06/03/2015 Elsevier Interactive Patient Education  2018 [place] with your walker Weight bearing as tolerated [place] will follow you at home for your therapy and IV medication administration Change your dressing daily or more often if needed. Use sterile gauze and paper taper Shower only, no  tub bath. Call if any temperatures greater than 101 or any wound complications: [phone] during the day and ask for Dr. Charlestine Night medical assistant, Brunilda Payor.

## 2017-09-16 NOTE — Progress Notes (Signed)
MEDICATION-RELATED CONSULT NOTE   IR Procedure Consult - Anticoagulant/Antiplatelet PTA/Inpatient Med List Review by Pharmacist    Procedure: PICC placement    Completed: 12/27 1732  Post-Procedural bleeding risk per IR MD assessment:  low  Antithrombotic medications on inpatient or PTA profile prior to procedure:     aspirin 325mg  po bid  Recommended restart time per IR Post-Procedure Guidelines:   At least 4 hours or at next standard dose interval  Other considerations:      Plan:     Continue with ordered aspirin 325mg  po bid  Dolly Rias RPh 09/16/2017, 6:20 PM Pager [phone]

## 2017-09-17 DIAGNOSIS — D631 Anemia in chronic kidney disease: Secondary | ICD-10-CM | POA: Diagnosis not present

## 2017-09-17 DIAGNOSIS — Z792 Long term (current) use of antibiotics: Secondary | ICD-10-CM

## 2017-09-17 DIAGNOSIS — I509 Heart failure, unspecified: Secondary | ICD-10-CM | POA: Diagnosis not present

## 2017-09-17 DIAGNOSIS — N2581 Secondary hyperparathyroidism of renal origin: Secondary | ICD-10-CM | POA: Diagnosis not present

## 2017-09-17 DIAGNOSIS — N186 End stage renal disease: Secondary | ICD-10-CM

## 2017-09-17 DIAGNOSIS — Z992 Dependence on renal dialysis: Secondary | ICD-10-CM

## 2017-09-17 DIAGNOSIS — D509 Iron deficiency anemia, unspecified: Secondary | ICD-10-CM | POA: Diagnosis not present

## 2017-09-17 LAB — BASIC METABOLIC PANEL
Anion gap: 16 — ABNORMAL HIGH (ref 5–15)
BUN: 103 mg/dL — AB (ref 6–20)
CO2: 22 mmol/L (ref 22–32)
CREATININE: 17.02 mg/dL — AB (ref 0.61–1.24)
Calcium: 7.9 mg/dL — ABNORMAL LOW (ref 8.9–10.3)
Chloride: 98 mmol/L — ABNORMAL LOW (ref 101–111)
GFR calc Af Amer: 3 mL/min — ABNORMAL LOW (ref >60)
GFR, EST NON AFRICAN AMERICAN: 3 mL/min — AB (ref >60)
GLUCOSE: 111 mg/dL — AB (ref 65–99)
POTASSIUM: 4.8 mmol/L (ref 3.5–5.1)
Sodium: 136 mmol/L (ref 135–145)

## 2017-09-17 LAB — PROTIME-INR
INR: 1.3
Prothrombin Time: 16.1 seconds — ABNORMAL HIGH (ref 11.4–15.2)

## 2017-09-17 MED ORDER — SODIUM CHLORIDE 0.9 % IV SOLN: 8.0000 mg/kg | INTRAVENOUS | 0 refills | Status: DC

## 2017-09-17 MED ORDER — DAPTOMYCIN IV (FOR PTA / DISCHARGE USE ONLY): 760.0000 mg | INTRAVENOUS | 0 refills | Status: DC

## 2017-09-17 MED ORDER — HEPARIN SOD (PORK) LOCK FLUSH 100 UNIT/ML IV SOLN: 250.0000 [IU] | Freq: Every day | INTRAVENOUS | Status: DC

## 2017-09-17 MED ORDER — SODIUM CHLORIDE 0.9 % IV SOLN: 8.0000 mg/kg | INTRAVENOUS | Status: DC

## 2017-09-17 MED ORDER — SODIUM CHLORIDE 0.9 % IV SOLN
8.0000 mg/kg | INTRAVENOUS | Status: DC
  Administered 2017-09-17: 762.5 mg via INTRAVENOUS
  Filled 2017-09-17: qty 15.25

## 2017-09-17 MED ORDER — HEPARIN SOD (PORK) LOCK FLUSH 100 UNIT/ML IV SOLN
250.0000 [IU] | INTRAVENOUS | Status: DC | PRN
  Administered 2017-09-17: 250 [IU]

## 2017-09-17 NOTE — Progress Notes (Signed)
Subjective: 2 Days Post-Op Procedure(s) (LRB): IRRIGATION AND DEBRIDEMENT KNEE; arthroscopic clean out (Left) Patient reports pain as 1 on 0-10 scale. Afebrile. Dressing changed and his knee is much better. Dr. Graylon Good has made arrangements for Home health to Administer his IV antibiotics. His Kidney functions were adressed wit him and he states they are elevated because he has not had his HOME dialysis since Monday. He will start today at Dc. He stated that his functions will be corrected easily after dialysis.   Objective: Vital signs in last 24 hours: Temp:  [97.4 F (36.3 C)-98.3 F (36.8 C)] 98.1 F (36.7 C) (12/28 0402) Pulse Rate:  [65-76] 69 (12/28 0402) Resp:  [18-20] 18 (12/28 0402) BP: (142-180)/(57-81) 167/81 (12/28 0402) SpO2:  [98 %-100 %] 99 % (12/28 0402)  Intake/Output from previous day: 12/27 0701 - 12/28 0700 In: 592.5 [P.O.:480; I.V.:112.5] Out: -  Intake/Output this shift: No intake/output data recorded.  Recent Labs    09/15/17 1330  HGB 7.1*   Recent Labs    09/15/17 1330  WBC 9.7  RBC 2.10*  HCT 21.0*  PLT 184   Recent Labs    09/16/17 0514 09/17/17 0544  NA 132* 136  K 5.4* 4.8  CL 97* 98*  CO2 21* 22  BUN 96* 103*  CREATININE 15.46* 17.02*  GLUCOSE 122* 111*  CALCIUM 8.0* 7.9*   Recent Labs    09/16/17 0514 09/17/17 0544  INR 1.19 1.30    No cellulitis present Compartment soft  Assessment/Plan: 2 Days Post-Op Procedure(s) (LRB): IRRIGATION AND DEBRIDEMENT KNEE; arthroscopic clean out (Left) Up with therapy.DC.Office Monday  [name] 09/17/2017, 7:20 AM

## 2017-09-17 NOTE — Progress Notes (Signed)
Pt's surgical site was leaking through the bandage. No orders in for wound care so I reinforced the existing bandage with an ABD pad.

## 2017-09-17 NOTE — Progress Notes (Signed)
Subjective: 2 Days Post-Op Procedure(s) (LRB): IRRIGATION AND DEBRIDEMENT KNEE; arthroscopic clean out (Left) Patient reports pain as 1 on 0-10 scale. I called Nephrology and she agrees that he will be fine yo Dc to his Home Dialysis as long as he begins today and he is mentally fine,which he is. He has been ver stable an very coherent.   Objective: Vital signs in last 24 hours: Temp:  [97.4 F (36.3 C)-98.3 F (36.8 C)] 98.1 F (36.7 C) (12/28 0402) Pulse Rate:  [65-76] 69 (12/28 0402) Resp:  [18-20] 18 (12/28 0402) BP: (142-180)/(57-81) 167/81 (12/28 0402) SpO2:  [98 %-100 %] 99 % (12/28 0402)  Intake/Output from previous day: 12/27 0701 - 12/28 0700 In: 592.5 [P.O.:480; I.V.:112.5] Out: -  Intake/Output this shift: No intake/output data recorded.  Recent Labs    09/15/17 1330  HGB 7.1*   Recent Labs    09/15/17 1330  WBC 9.7  RBC 2.10*  HCT 21.0*  PLT 184   Recent Labs    09/16/17 0514 09/17/17 0544  NA 132* 136  K 5.4* 4.8  CL 97* 98*  CO2 21* 22  BUN 96* 103*  CREATININE 15.46* 17.02*  GLUCOSE 122* 111*  CALCIUM 8.0* 7.9*   Recent Labs    09/16/17 0514 09/17/17 0544  INR 1.19 1.30    Dorsiflexion/Plantar flexion intact No cellulitis present  Assessment/Plan: 2 Days Post-Op Procedure(s) (LRB): IRRIGATION AND DEBRIDEMENT KNEE; arthroscopic clean out (Left) Up with therapy  [name] 09/17/2017, 7:33 AM

## 2017-09-17 NOTE — Progress Notes (Signed)
Date: September 17, 2017 Velva Harman, BSN, [place], [facility] we did not service this area any longer/tct-[facility]/can do home trology vent/information faxed to attention Donna Christen.

## 2017-09-17 NOTE — Progress Notes (Signed)
Date: September 17, 2017 Velva Harman, BSN, [place], [place] Chart and notes review for patient progress and needs. Will follow for case management and discharge needs. Next review date: [id number]

## 2017-09-17 NOTE — Progress Notes (Signed)
Pt finished dose of antibiotic. Pt denies pain at the time of d/c. Pt and caregiver understand discharge instructions and plan of care after d/c. Pt taken to the front entrance via wheelchair with nursing staff and brother present.

## 2017-09-17 NOTE — Progress Notes (Signed)
PT Cancellation Note  Patient Details Name: Jeffrey [name] MRN: [id number] DOB: [birth year]   Cancelled Treatment:    Reason Eval/Treat Not Completed: Other (comment)patient and brother agree that practice steps is not necessary. The patient is [facility] home  ASAP for His HD. RN aware.   Jeffrey [name] 09/17/2017, 10:35 AM  Jeffrey [name] PT [phone]

## 2017-09-17 NOTE — Progress Notes (Signed)
OT Cancellation Note  Patient Details Name: Jeffrey [name] MRN: [id number] DOB: 19-Jul-1956   Cancelled Treatment:    Reason Eval/Treat Not Completed: Other (comment); Pt dressed, sitting EOB upon entering room preparing for discharge, reporting feeling at his baseline regarding ADL completion and with brother's assist PRN after return home. OT referral is appreciated. Will sign off.  Lou Cal, OT Pager [phone] 09/17/2017   Jeffrey [name] 09/17/2017, 10:46 AM

## 2017-09-18 DIAGNOSIS — Z87891 Personal history of nicotine dependence: Secondary | ICD-10-CM | POA: Diagnosis not present

## 2017-09-18 DIAGNOSIS — Z7951 Long term (current) use of inhaled steroids: Secondary | ICD-10-CM | POA: Diagnosis not present

## 2017-09-18 DIAGNOSIS — B199 Unspecified viral hepatitis without hepatic coma: Secondary | ICD-10-CM | POA: Diagnosis not present

## 2017-09-18 DIAGNOSIS — B20 Human immunodeficiency virus [HIV] disease: Secondary | ICD-10-CM | POA: Diagnosis not present

## 2017-09-18 DIAGNOSIS — N186 End stage renal disease: Secondary | ICD-10-CM | POA: Diagnosis not present

## 2017-09-18 DIAGNOSIS — Z992 Dependence on renal dialysis: Secondary | ICD-10-CM | POA: Diagnosis not present

## 2017-09-18 DIAGNOSIS — Z79891 Long term (current) use of opiate analgesic: Secondary | ICD-10-CM | POA: Diagnosis not present

## 2017-09-18 DIAGNOSIS — M1990 Unspecified osteoarthritis, unspecified site: Secondary | ICD-10-CM | POA: Diagnosis not present

## 2017-09-18 DIAGNOSIS — D631 Anemia in chronic kidney disease: Secondary | ICD-10-CM | POA: Diagnosis not present

## 2017-09-18 DIAGNOSIS — Z452 Encounter for adjustment and management of vascular access device: Secondary | ICD-10-CM | POA: Diagnosis not present

## 2017-09-18 DIAGNOSIS — F419 Anxiety disorder, unspecified: Secondary | ICD-10-CM | POA: Diagnosis not present

## 2017-09-18 DIAGNOSIS — D696 Thrombocytopenia, unspecified: Secondary | ICD-10-CM | POA: Diagnosis not present

## 2017-09-18 DIAGNOSIS — M00062 Staphylococcal arthritis, left knee: Secondary | ICD-10-CM | POA: Diagnosis not present

## 2017-09-18 DIAGNOSIS — Z7982 Long term (current) use of aspirin: Secondary | ICD-10-CM | POA: Diagnosis not present

## 2017-09-18 DIAGNOSIS — I120 Hypertensive chronic kidney disease with stage 5 chronic kidney disease or end stage renal disease: Secondary | ICD-10-CM | POA: Diagnosis not present

## 2017-09-18 NOTE — Anesthesia Postprocedure Evaluation (Signed)
Anesthesia Post Note  Patient: Jeffrey [name]  Procedure(s) Performed: IRRIGATION AND DEBRIDEMENT KNEE; arthroscopic clean out (Left )     Patient location during evaluation: PACU Anesthesia Type: General Level of consciousness: awake and alert Pain management: pain level controlled Vital Signs Assessment: post-procedure vital signs reviewed and stable Respiratory status: spontaneous breathing, nonlabored ventilation, respiratory function stable and patient connected to nasal cannula oxygen Cardiovascular status: blood pressure returned to baseline and stable Postop Assessment: no apparent nausea or vomiting Anesthetic complications: no    Last Vitals:  Vitals:   09/16/17 2042 09/17/17 0402  BP: (!) 180/75 (!) 167/81  Pulse: 65 69  Resp: 18 18  Temp: (!) 36.3 C 36.7 C  SpO2: 98% 99%    Last Pain:  Vitals:   09/17/17 0402  TempSrc: Oral  PainSc:                  [name]

## 2017-09-19 DIAGNOSIS — M00062 Staphylococcal arthritis, left knee: Secondary | ICD-10-CM | POA: Diagnosis not present

## 2017-09-19 DIAGNOSIS — B20 Human immunodeficiency virus [HIV] disease: Secondary | ICD-10-CM | POA: Diagnosis not present

## 2017-09-19 DIAGNOSIS — D631 Anemia in chronic kidney disease: Secondary | ICD-10-CM | POA: Diagnosis not present

## 2017-09-19 DIAGNOSIS — I120 Hypertensive chronic kidney disease with stage 5 chronic kidney disease or end stage renal disease: Secondary | ICD-10-CM | POA: Diagnosis not present

## 2017-09-19 DIAGNOSIS — D696 Thrombocytopenia, unspecified: Secondary | ICD-10-CM | POA: Diagnosis not present

## 2017-09-19 DIAGNOSIS — N186 End stage renal disease: Secondary | ICD-10-CM | POA: Diagnosis not present

## 2017-09-19 LAB — BODY FLUID CULTURE: CULTURE: NO GROWTH

## 2017-09-20 DIAGNOSIS — N2581 Secondary hyperparathyroidism of renal origin: Secondary | ICD-10-CM | POA: Diagnosis not present

## 2017-09-20 DIAGNOSIS — I509 Heart failure, unspecified: Secondary | ICD-10-CM | POA: Diagnosis not present

## 2017-09-20 DIAGNOSIS — D631 Anemia in chronic kidney disease: Secondary | ICD-10-CM | POA: Diagnosis not present

## 2017-09-20 DIAGNOSIS — N186 End stage renal disease: Secondary | ICD-10-CM | POA: Diagnosis not present

## 2017-09-20 DIAGNOSIS — Z4789 Encounter for other orthopedic aftercare: Secondary | ICD-10-CM | POA: Diagnosis not present

## 2017-09-20 DIAGNOSIS — M00862 Arthritis due to other bacteria, left knee: Secondary | ICD-10-CM | POA: Diagnosis not present

## 2017-09-20 DIAGNOSIS — D509 Iron deficiency anemia, unspecified: Secondary | ICD-10-CM | POA: Diagnosis not present

## 2017-09-20 DIAGNOSIS — Z992 Dependence on renal dialysis: Secondary | ICD-10-CM | POA: Diagnosis not present

## 2017-09-20 LAB — ANAEROBIC CULTURE

## 2017-09-21 ENCOUNTER — Other Ambulatory Visit: Payer: Self-pay | Admitting: Internal Medicine

## 2017-09-21 DIAGNOSIS — D631 Anemia in chronic kidney disease: Secondary | ICD-10-CM | POA: Diagnosis not present

## 2017-09-21 DIAGNOSIS — N186 End stage renal disease: Secondary | ICD-10-CM | POA: Diagnosis not present

## 2017-09-21 DIAGNOSIS — Z992 Dependence on renal dialysis: Secondary | ICD-10-CM | POA: Diagnosis not present

## 2017-09-21 DIAGNOSIS — M00062 Staphylococcal arthritis, left knee: Secondary | ICD-10-CM | POA: Diagnosis not present

## 2017-09-21 DIAGNOSIS — B20 Human immunodeficiency virus [HIV] disease: Secondary | ICD-10-CM | POA: Diagnosis not present

## 2017-09-21 DIAGNOSIS — I120 Hypertensive chronic kidney disease with stage 5 chronic kidney disease or end stage renal disease: Secondary | ICD-10-CM | POA: Diagnosis not present

## 2017-09-21 DIAGNOSIS — M00869 Arthritis due to other bacteria, unspecified knee: Secondary | ICD-10-CM | POA: Diagnosis not present

## 2017-09-21 DIAGNOSIS — D696 Thrombocytopenia, unspecified: Secondary | ICD-10-CM | POA: Diagnosis not present

## 2017-09-21 DIAGNOSIS — I509 Heart failure, unspecified: Secondary | ICD-10-CM | POA: Diagnosis not present

## 2017-09-21 LAB — CULTURE, BLOOD (ROUTINE X 2)
CULTURE: NO GROWTH
Culture: NO GROWTH
Special Requests: ADEQUATE

## 2017-09-22 MED ORDER — HYDRALAZINE HCL 100 MG PO TABS: 100.0000 mg | ORAL_TABLET | Freq: Two times a day (BID) | ORAL | 1 refills | Status: DC

## 2017-09-22 NOTE — Telephone Encounter (Signed)
Patient is requesting refill of hydralazine. He has not been seen by our clinic for HTN since 12/2016. I believe his nephrologist has been managing this. I am unclear on his current regimen. He was recently discharged from the hospital at the end of December and I do not see a discharge summary or med reconciliation. Prior to hospitalization, it appears he was prescribed hydralazine 100 mg BID (requesting 50 q8). I will provide a 1-2 month supply of his most recent prescription, please have patient schedule a follow up appointment and bring his discharge papers with him. Thank you.

## 2017-09-23 ENCOUNTER — Inpatient Hospital Stay (HOSPITAL_COMMUNITY)
Admission: EM | Admit: 2017-09-23 | Discharge: 2017-09-26 | DRG: 682 | Disposition: A | Discharge status: Home / Self Care | Payer: Medicare Other | Attending: Internal Medicine | Admitting: Internal Medicine

## 2017-09-23 ENCOUNTER — Other Ambulatory Visit: Payer: Self-pay

## 2017-09-23 ENCOUNTER — Telehealth: Payer: Self-pay | Admitting: Internal Medicine

## 2017-09-23 ENCOUNTER — Encounter (HOSPITAL_COMMUNITY): Payer: Self-pay | Admitting: *Deleted

## 2017-09-23 DIAGNOSIS — M898X9 Other specified disorders of bone, unspecified site: Secondary | ICD-10-CM | POA: Diagnosis present

## 2017-09-23 DIAGNOSIS — Z8249 Family history of ischemic heart disease and other diseases of the circulatory system: Secondary | ICD-10-CM | POA: Diagnosis not present

## 2017-09-23 DIAGNOSIS — N186 End stage renal disease: Secondary | ICD-10-CM | POA: Diagnosis not present

## 2017-09-23 DIAGNOSIS — D631 Anemia in chronic kidney disease: Secondary | ICD-10-CM | POA: Diagnosis present

## 2017-09-23 DIAGNOSIS — Z833 Family history of diabetes mellitus: Secondary | ICD-10-CM

## 2017-09-23 DIAGNOSIS — R69 Illness, unspecified: Secondary | ICD-10-CM

## 2017-09-23 DIAGNOSIS — N2581 Secondary hyperparathyroidism of renal origin: Secondary | ICD-10-CM | POA: Diagnosis present

## 2017-09-23 DIAGNOSIS — Z888 Allergy status to other drugs, medicaments and biological substances status: Secondary | ICD-10-CM | POA: Diagnosis not present

## 2017-09-23 DIAGNOSIS — I120 Hypertensive chronic kidney disease with stage 5 chronic kidney disease or end stage renal disease: Secondary | ICD-10-CM | POA: Diagnosis not present

## 2017-09-23 DIAGNOSIS — B181 Chronic viral hepatitis B without delta-agent: Secondary | ICD-10-CM | POA: Diagnosis present

## 2017-09-23 DIAGNOSIS — Z8619 Personal history of other infectious and parasitic diseases: Secondary | ICD-10-CM | POA: Diagnosis not present

## 2017-09-23 DIAGNOSIS — B1910 Unspecified viral hepatitis B without hepatic coma: Secondary | ICD-10-CM | POA: Diagnosis not present

## 2017-09-23 DIAGNOSIS — Z9889 Other specified postprocedural states: Secondary | ICD-10-CM | POA: Diagnosis not present

## 2017-09-23 DIAGNOSIS — R918 Other nonspecific abnormal finding of lung field: Secondary | ICD-10-CM | POA: Diagnosis not present

## 2017-09-23 DIAGNOSIS — I351 Nonrheumatic aortic (valve) insufficiency: Secondary | ICD-10-CM | POA: Diagnosis present

## 2017-09-23 DIAGNOSIS — Z87891 Personal history of nicotine dependence: Secondary | ICD-10-CM | POA: Diagnosis not present

## 2017-09-23 DIAGNOSIS — Z7982 Long term (current) use of aspirin: Secondary | ICD-10-CM

## 2017-09-23 DIAGNOSIS — B20 Human immunodeficiency virus [HIV] disease: Secondary | ICD-10-CM | POA: Diagnosis not present

## 2017-09-23 DIAGNOSIS — F419 Anxiety disorder, unspecified: Secondary | ICD-10-CM | POA: Diagnosis present

## 2017-09-23 DIAGNOSIS — Z8614 Personal history of Methicillin resistant Staphylococcus aureus infection: Secondary | ICD-10-CM | POA: Diagnosis not present

## 2017-09-23 DIAGNOSIS — M009 Pyogenic arthritis, unspecified: Secondary | ICD-10-CM | POA: Diagnosis present

## 2017-09-23 DIAGNOSIS — I10 Essential (primary) hypertension: Secondary | ICD-10-CM | POA: Diagnosis present

## 2017-09-23 DIAGNOSIS — D649 Anemia, unspecified: Secondary | ICD-10-CM | POA: Diagnosis present

## 2017-09-23 DIAGNOSIS — Z809 Family history of malignant neoplasm, unspecified: Secondary | ICD-10-CM

## 2017-09-23 DIAGNOSIS — Z880 Allergy status to penicillin: Secondary | ICD-10-CM | POA: Diagnosis not present

## 2017-09-23 DIAGNOSIS — M1990 Unspecified osteoarthritis, unspecified site: Secondary | ICD-10-CM | POA: Diagnosis present

## 2017-09-23 DIAGNOSIS — Z992 Dependence on renal dialysis: Secondary | ICD-10-CM

## 2017-09-23 LAB — COMPREHENSIVE METABOLIC PANEL
ALT: 7 U/L — ABNORMAL LOW (ref 17–63)
ANION GAP: 12 (ref 5–15)
AST: 16 U/L (ref 15–41)
Albumin: 3 g/dL — ABNORMAL LOW (ref 3.5–5.0)
Alkaline Phosphatase: 58 U/L (ref 38–126)
BILIRUBIN TOTAL: 0.6 mg/dL (ref 0.3–1.2)
BUN: 48 mg/dL — AB (ref 6–20)
CHLORIDE: 95 mmol/L — AB (ref 101–111)
CO2: 26 mmol/L (ref 22–32)
Calcium: 8.8 mg/dL — ABNORMAL LOW (ref 8.9–10.3)
Creatinine, Ser: 9.78 mg/dL — ABNORMAL HIGH (ref 0.61–1.24)
GFR calc non Af Amer: 5 mL/min — ABNORMAL LOW (ref >60)
GFR, EST AFRICAN AMERICAN: 6 mL/min — AB (ref >60)
Glucose, Bld: 125 mg/dL — ABNORMAL HIGH (ref 65–99)
POTASSIUM: 3.4 mmol/L — AB (ref 3.5–5.1)
Sodium: 133 mmol/L — ABNORMAL LOW (ref 135–145)
TOTAL PROTEIN: 6.6 g/dL (ref 6.5–8.1)

## 2017-09-23 LAB — APTT: aPTT: 40 seconds — ABNORMAL HIGH (ref 24–36)

## 2017-09-23 LAB — IRON AND TIBC
Iron: 28 ug/dL — ABNORMAL LOW (ref 45–182)
Saturation Ratios: 21 % (ref 17.9–39.5)
TIBC: 136 ug/dL — ABNORMAL LOW (ref 250–450)
UIBC: 108 ug/dL

## 2017-09-23 LAB — RETICULOCYTES
RBC.: 2.15 MIL/uL — ABNORMAL LOW (ref 4.22–5.81)
Retic Count, Absolute: 68.8 10*3/uL (ref 19.0–186.0)
Retic Ct Pct: 3.2 % — ABNORMAL HIGH (ref 0.4–3.1)

## 2017-09-23 LAB — CBC
HEMATOCRIT: 15 % — AB (ref 39.0–52.0)
Hemoglobin: 5 g/dL — CL (ref 13.0–17.0)
MCH: 33.1 pg (ref 26.0–34.0)
MCHC: 33.3 g/dL (ref 30.0–36.0)
MCV: 99.3 fL (ref 78.0–100.0)
Platelets: 158 10*3/uL (ref 150–400)
RBC: 1.51 MIL/uL — AB (ref 4.22–5.81)
RDW: 14.1 % (ref 11.5–15.5)
WBC: 6.3 10*3/uL (ref 4.0–10.5)

## 2017-09-23 LAB — PROTIME-INR
INR: 1.12
Prothrombin Time: 14.3 seconds (ref 11.4–15.2)

## 2017-09-23 LAB — FERRITIN: Ferritin: 851 ng/mL — ABNORMAL HIGH (ref 24–336)

## 2017-09-23 LAB — SAMPLE TO BLOOD BANK

## 2017-09-23 LAB — LACTATE DEHYDROGENASE: LDH: 105 U/L (ref 98–192)

## 2017-09-23 LAB — PREPARE RBC (CROSSMATCH)

## 2017-09-23 LAB — LIPASE, BLOOD: LIPASE: 24 U/L (ref 11–51)

## 2017-09-23 LAB — HEMOGLOBIN AND HEMATOCRIT, BLOOD
HCT: 20.1 % — ABNORMAL LOW (ref 39.0–52.0)
Hemoglobin: 6.7 g/dL — CL (ref 13.0–17.0)

## 2017-09-23 MED ORDER — RALTEGRAVIR POTASSIUM 400 MG PO TABS
400.0000 mg | ORAL_TABLET | Freq: Two times a day (BID) | ORAL | Status: DC
  Administered 2017-09-23 – 2017-09-26 (×6): 400 mg via ORAL
  Filled 2017-09-23 (×7): qty 1

## 2017-09-23 MED ORDER — HYDROCODONE-ACETAMINOPHEN 10-325 MG PO TABS: 1.0000 | ORAL_TABLET | Freq: Four times a day (QID) | ORAL | Status: DC | PRN

## 2017-09-23 MED ORDER — LAMIVUDINE 10 MG/ML PO SOLN
25.0000 mg | Freq: Every day | ORAL | Status: DC
  Administered 2017-09-23 – 2017-09-25 (×3): 25 mg via ORAL
  Filled 2017-09-23 (×3): qty 5

## 2017-09-23 MED ORDER — DAPTOMYCIN IV (FOR PTA / DISCHARGE USE ONLY): 760.0000 mg | INTRAVENOUS | Status: DC

## 2017-09-23 MED ORDER — SODIUM CHLORIDE 0.9 % IV SOLN
125.0000 mg | INTRAVENOUS | Status: DC
  Administered 2017-09-24: 125 mg via INTRAVENOUS
  Filled 2017-09-23 (×2): qty 10

## 2017-09-23 MED ORDER — SODIUM CHLORIDE 0.9% FLUSH: 3.0000 mL | INTRAVENOUS | Status: DC | PRN

## 2017-09-23 MED ORDER — SODIUM CHLORIDE 0.9 % IV SOLN
Freq: Once | INTRAVENOUS | Status: AC
  Administered 2017-09-23: 17:00:00 via INTRAVENOUS

## 2017-09-23 MED ORDER — CALCIUM ACETATE (PHOS BINDER) 667 MG PO CAPS
2001.0000 mg | ORAL_CAPSULE | Freq: Three times a day (TID) | ORAL | Status: DC
  Administered 2017-09-23 – 2017-09-26 (×7): 2001 mg via ORAL
  Filled 2017-09-23 (×9): qty 3

## 2017-09-23 MED ORDER — CALCIUM ACETATE (PHOS BINDER) 667 MG PO CAPS: 1334.0000 mg | ORAL_CAPSULE | ORAL | Status: DC

## 2017-09-23 MED ORDER — DAPTOMYCIN 500 MG IV SOLR
8.0000 mg/kg | INTRAVENOUS | Status: DC
  Administered 2017-09-23 – 2017-09-25 (×2): 680 mg via INTRAVENOUS
  Filled 2017-09-23 (×3): qty 13.6

## 2017-09-23 MED ORDER — TENOFOVIR DISOPROXIL FUMARATE 300 MG PO TABS
300.0000 mg | ORAL_TABLET | ORAL | Status: DC
  Administered 2017-09-25: 300 mg via ORAL
  Filled 2017-09-23: qty 1

## 2017-09-23 MED ORDER — CLONIDINE HCL 0.1 MG/24HR TD PTWK
0.1000 mg | MEDICATED_PATCH | TRANSDERMAL | Status: DC
  Filled 2017-09-23: qty 1

## 2017-09-23 MED ORDER — SODIUM CHLORIDE 0.9% FLUSH
3.0000 mL | Freq: Two times a day (BID) | INTRAVENOUS | Status: DC
  Administered 2017-09-23 – 2017-09-26 (×7): 3 mL via INTRAVENOUS

## 2017-09-23 MED ORDER — CLONIDINE HCL 0.1 MG/24HR TD PTWK
0.1000 mg | MEDICATED_PATCH | TRANSDERMAL | Status: DC
  Administered 2017-09-23: 0.1 mg via TRANSDERMAL
  Filled 2017-09-23: qty 1

## 2017-09-23 MED ORDER — SODIUM CHLORIDE 0.9 % IV SOLN: 250.0000 mL | INTRAVENOUS | Status: DC | PRN

## 2017-09-23 MED ORDER — TRAZODONE HCL 50 MG PO TABS
50.0000 mg | ORAL_TABLET | Freq: Every evening | ORAL | Status: DC | PRN
  Administered 2017-09-24 – 2017-09-26 (×2): 50 mg via ORAL
  Filled 2017-09-23 (×2): qty 1

## 2017-09-23 MED ORDER — SODIUM CHLORIDE 0.9 % IV SOLN
10.0000 mL/h | Freq: Once | INTRAVENOUS | Status: AC
Start: 2017-09-23 — End: 2017-09-23
  Administered 2017-09-23: 10 mL/h via INTRAVENOUS

## 2017-09-23 MED ORDER — ALPRAZOLAM 0.25 MG PO TABS
0.2500 mg | ORAL_TABLET | Freq: Every day | ORAL | Status: DC | PRN
  Administered 2017-09-23 – 2017-09-24 (×2): 0.25 mg via ORAL
  Filled 2017-09-23 (×2): qty 1

## 2017-09-23 MED ORDER — LAMIVUDINE 5 MG/ML PO SOLN: 25.0000 mg | Freq: Every day | ORAL | Status: DC

## 2017-09-23 MED ORDER — CLOTRIMAZOLE 1 % EX CREA
TOPICAL_CREAM | Freq: Two times a day (BID) | CUTANEOUS | Status: DC
  Administered 2017-09-23 – 2017-09-26 (×3): via TOPICAL
  Filled 2017-09-23 (×2): qty 15

## 2017-09-23 NOTE — H&P (Signed)
Date: 09/23/2017               Patient Name:  Jeffrey [name] MRN: [id number]  DOB: Mar 30, 1956 Age / Sex: 62 y.o., male   PCP: Velna Ochs, MD         Medical Service: Internal Medicine Teaching Service         Attending Physician: Dr. Oda Kilts, MD    First Contact: Dr. Tarri Abernethy Pager: [phone]  Second Contact: Dr. Danford Bad Pager: [phone]       After Hours (After 5p/  First Contact Pager: [phone]  weekends / holidays): Second Contact Pager: [phone]   Chief Complaint: Low Hgb  History of Present Illness:  [name] is a 62 year old gentleman with an extensive past medical history significant for ESRD on home dialysis four times weekly, HIV (CD4 count 460 01/2017), hepatitis B, chronic anemia (baseline 8-10), HTN, aortic valve insufficiency, recent left knee septic arthritis s/p arthrocentesis with irrigation and debridement on 12/26 presenting to the ED today after having outpatient labs result with Hgb of 4.9. He reports that he had recent hospitalization at [facility] by orthopedic surgery for treatment of his left knee septic joint on 12/26. He was started on Daptomycin following ID consult at that time. The morning prior to surgery his Hgb was 7.1. No repeat were done post surgery. Home health nurse drew labs which resulted back with a hemoglobin of 4.9 and patient was instructed to come to the ED for evaluation. He denies any symptoms today. He does note that he has felt more sluggish and fatigued recently but otherwise has been doing well. He denies any chest pain, shortness of breath, palpitations. Denies any overt blood loss that he has noted; no hematochezia, melena, hematemesis. He does not produce urine. Does note having a strained bowel movement last night and noting some scant bright red blood on the toilet paper afterwards.   Meds:  Current Meds  Medication Sig  . albuterol (PROVENTIL) (2.5 MG/3ML) 0.083% nebulizer solution Take 2.5 mg by nebulization every 6  (six) hours as needed for wheezing or shortness of breath.  . ALPRAZolam (XANAX) 0.25 MG tablet Take 0.25 mg by mouth daily as needed for anxiety.  . calcium acetate (PHOSLO) 667 MG capsule Take 667-2,001 mg by mouth See admin instructions. 1,334-2,001 mg three times a day with each meal and 667-1,334 mg with each snack  . carvedilol (COREG) 25 MG tablet Take 25 mg by mouth 2 (two) times daily with a meal.  . cloNIDine (CATAPRES - DOSED IN MG/24 HR) 0.1 mg/24hr patch UNWRAP AND APPLY 1 PATCH TO SKIN EVERY 7 DAYS (Patient taking differently: UNWRAP AND APPLY 1 PATCH TO SKIN EVERY 7 DAYS (Fridays))  . clotrimazole-betamethasone (LOTRISONE) cream Apply 1 application topically 2 (two) times daily.  . daptomycin (CUBICIN) IVPB Inject 760 mg into the vein every other day for 27 days. Administer dose after HD on HD days. Indication:  Septic joint Last Day of Therapy:  10/14/2017 Labs - Once weekly:  CBC/D, CRP, ESR, and CPK Fax weekly labs to [phone]  . doxazosin (CARDURA) 4 MG tablet Take 4 mg by mouth daily.  Marland Kitchen epoetin alfa (EPOGEN,PROCRIT) 2000 UNIT/ML injection Inject 2,000 Units into the vein 3 (three) times a week.  . ethyl chloride spray Apply 1 application topically daily as needed (HD). At HD  . heparin 1000 UNIT/ML injection Inject 3,000 Units into the vein 3 (three) times a week.  . hydrALAZINE (  APRESOLINE) 100 MG tablet Take 1 tablet (100 mg total) by mouth 2 (two) times daily. (Patient taking differently: Take 100 mg by mouth 3 (three) times daily. )  . HYDROcodone-acetaminophen (NORCO) 10-325 MG tablet Take 1-2 tablets by mouth every 6 (six) hours as needed for severe pain ((score 7 to 10)).  . ISENTRESS 400 MG tablet TAKE 1 TABLET(400 MG) BY MOUTH TWICE DAILY  . lamivudine (EPIVIR HBV) 5 MG/ML solution TAKE 5 ML BY MOUTH EVERY DAY (Patient taking differently: Take by mouth at bedtime. TAKE 5 ML BY MOUTH EVERY DAY)  . methocarbamol (ROBAXIN) 500 MG tablet Take 1 tablet (500 mg total)  by mouth every 8 (eight) hours as needed for muscle spasms.  . multivitamin (RENA-VIT) TABS tablet Take 1 tablet by mouth daily.    Marland Kitchen oxyCODONE-acetaminophen (PERCOCET/ROXICET) 5-325 MG tablet Take 1 tablet by mouth every 6 (six) hours as needed for severe pain.  Marland Kitchen PROCTOZONE-HC 2.5 % rectal cream Place 1 application rectally 2 (two) times daily as needed.  Marland Kitchen tenofovir (VIREAD) 300 MG tablet TAKE 1 TABLET BY MOUTH EVERY SATURDAY AFTER DIALYSIS  . traZODone (DESYREL) 50 MG tablet Take 50 mg by mouth at bedtime as needed for sleep.  . VOLTAREN 1 % GEL APPLY 2 GRAMS EXTERNALLY TO THE AFFECTED AREA FOUR TIMES DAILY     Allergies: Allergies as of 09/23/2017 - Review Complete 09/23/2017  Allergen Reaction Noted  . Lisinopril Anaphylaxis and Shortness Of Breath 08/31/2012  . Penicillins Other (See Comments)    Past Medical History:  Diagnosis Date  . Anemia   . Anxiety   . Arthritis   . Asthma    per pt hx  . End stage renal disease on home HD 07/10/2011   Started HD in September 2012 at [facility] with a tunneled HD catheter, now on home HD with NxtStage. Dialyzing through AVF L lower arm with buttonhole technique as of mid 2014. His brother does the HD treatments at home.  They are roommates for 23 years.  The brother works 3rd shift and gets off about 8am and then puts Mr Lall on HD in the morning after getting home. Most of the time he does HD about 4 times a week, for about 4 hours per treatment. Cause of ESRD was HTN according to patient. He says he let his health go and ending up with complications, and that he didn't like seeing doctors in those days.  He says he was diagnosed with severe HTN when he lived in [place] in his 89's.   . Hepatitis B carrier ([place])   . HIV infection ([place])   . Hypertension   . Hyperthyroidism    normal now  . Pneumonia several yrs ago  . Seizure ([place])   . Seizures ([place]) 06/02/2011   x 1 none since  . Thrombocytopenia ([place])     Family History:  Family  History  Problem Relation Age of Onset  . Hypertension Mother   . Diabetes Mother   . Cancer Father    Social History:  Social History   Socioeconomic History  . Marital status: Single    Spouse name: None  . Number of children: None  . Years of education: None  . Highest education level: None  Social Needs  . Financial resource strain: None  . Food insecurity - worry: None  . Food insecurity - inability: None  . Transportation needs - medical: None  . Transportation needs - non-medical: None  Occupational History  .  None  Tobacco Use  . Smoking status: Former Smoker    Years: 10.00    Types: Cigarettes    Last attempt to quit: 08/08/2015    Years since quitting: 2.1  . Smokeless tobacco: Never Used  . Tobacco comment: casual smoking for 10 years  Substance and Sexual Activity  . Alcohol use: No    Alcohol/week: 0.0 oz    Comment: quit 2004  . Drug use: No    Comment: uds (+) cocaine in 08/2011 but pt states was taking sudafed at the time  . Sexual activity: Not Currently    Partners: Male  Other Topics Concern  . 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   Physical Exam: Blood pressure (!) 185/78, pulse 81, temperature 98 F (36.7 C), temperature source Oral, resp. rate (!) 23, height 6' (1.829 m), weight 187 lb 6.3 oz (85 kg), SpO2 100 %. Physical Exam  Constitutional: He is oriented to person, place, and time and well-developed, well-nourished, and in no distress. No distress.  HENT:  Head: Normocephalic and atraumatic.  Pale mucous membranes  Eyes: Conjunctivae and EOM are normal. Pupils are equal, round, and reactive to light.  Cardiovascular: Normal rate and regular rhythm.  Murmur heard.  Systolic murmur is present with a grade  of 3/6. Pulmonary/Chest: Effort normal and breath sounds normal. No respiratory distress. He has no wheezes. He has no rales.  Catheter left anterior chest pain. Clean, dry, intact without surrounding erythema.   Abdominal: Soft. Bowel sounds are normal. He exhibits no distension. There is no tenderness.  Musculoskeletal: Normal range of motion. He exhibits no edema.  Left knee mildly edematous. Non-tender. Mildly increased warmth. No surrounding erythema.  Dialysis fistula in left forearm. Palpable thrill.   Neurological: He is alert and oriented to person, place, and time. No cranial nerve deficit.  Skin: Skin is warm and dry. No rash noted. He is not diaphoretic. No erythema.  Psychiatric: Mood and affect normal.  Vitals reviewed.   Assessment & Plan by Problem:  Acute on Chronic Anemia:  Patient with Hgb 5.0 on arrival. His Hgbs in the past have ranged from 8-10. Most recently, prior to his knee arthrocentesis on 12/26 his Hgb was 7.1. No repeats were drawn following surgery until these most recent outpatient labs that resulted overnight. He has no complaints other than some generalized fatigue. No chest pain, SOB, palpitations. Denies any overt blood loss and only notes some scant blood on the toilet paper after straining to have a bowel movement last night. Vital signs stable without tachycardia and is hypertensive. Does have significant 3/6 systolic murmur on exam. Has a history of aortic insufficiency. Most recent ECHO 03/2017 with moderate regurgitation. Suspect this is most likely acute on chronic anemia following his recent procedure with anemia 2/2 his renal failure. However, given his murmur will check LDH to assess for hemolysis (though bilirubin normal). Will check iron studies as well. Transfuse 2 units PRBC and repeat CBC following transfusion.   Recent Left Knee Septic Arthritis s/p Arthrocentesis with irrigation and drainage: Aspiration on 12/10 with rare MRSE. Initially placed  on doxycyline. Had worsening symptoms and was admitted with arthrocentesis 12/26. ID recommended daptomycin x 4  weeks. OR cultures from 12/26 without any growth. Will continue course of daptomycin.   ESRD on home HD Will consult nephrology for dialysis while inpatient  HIV AIDS - last CD4 count 460 01/2017 Continue HAART  Recheck CD4 and viral load  HTN Hypertensive here. Will hold home BP medications given degree of anemia until resolved.    Lung Nodules Bilateral pulmonary nodules noted on previous imaging. Most recently CT chest 03/2017 noted largest nodule in left lower lobe 10 mm. Recommended follow up CT in 3 months vs PET scan. Does not appear to have had any follow up since this imaging. Will try to obtain follow up imaging while inpatient prior to discharge.   Code: FULL  Dispo: Admit patient to Inpatient with expected length of stay greater than 2 midnights.  Signed: Maryellen Pile, MD 09/23/2017, 11:04 AM  Pager: [phone]

## 2017-09-23 NOTE — ED Provider Notes (Addendum)
[facility] EMERGENCY DEPARTMENT Provider Note   CSN: [id number] Arrival date & time: 09/23/17  0607     History   Chief Complaint Chief Complaint  Patient presents with  . low hgb    HPI Jeffrey [name] is a 62 y.o. male.  HPI Patient has multiple comorbid illnesses including ESRD on dialysis.  Patient has recent septic joint of the left knee.  This is being treated with antibiotics through a chest catheter.  The knee has been drained, irrigated and is actively managed by orthopedics.  Patient reports that is much better.  He reports he is getting daptomycin through his IV.  Patient presents today for low blood count.  He had been getting increasingly weak and fatigued.  Ports he just was not feeling like himself.  Denies he specifically noted dark or bloody stool.  He reports he did have to strain at stool yesterday and there was a little bit of bright red blood.  Ports he was anemic once before and required blood transfusion about a year ago. Past Medical History:  Diagnosis Date  . Anemia   . Anxiety   . Arthritis   . Asthma    per pt hx  . End stage renal disease on home HD 07/10/2011   Started HD in September 2012 at [facility] with a tunneled HD catheter, now on home HD with [facility]. Dialyzing through AVF L lower arm with buttonhole technique as of mid 2014. His brother does the HD treatments at home.  They are roommates for 23 years.  The brother works 3rd shift and gets off about 8am and then puts Mr Mcenroe on HD in the morning after getting home. Most of the time he does HD about 4 times a week, for about 4 hours per treatment. Cause of ESRD was HTN according to patient. He says he let his health go and ending up with complications, and that he didn't like seeing doctors in those days.  He says he was diagnosed with severe HTN when he lived in [place] in his 26's.   . Hepatitis B carrier ([place])   . HIV infection ([place])   . Hypertension   . Hyperthyroidism    normal now  . Pneumonia several yrs ago  . Seizure ([place])   . Seizures ([place]) 06/02/2011   x 1 none since  . Thrombocytopenia [facility])     Patient Active Problem List   Diagnosis Date Noted  . Antibiotic long-term use   . ESRD on hemodialysis ([place])   . Septic arthritis ([place]) 09/15/2017  . Effusion of left knee 09/08/2017  . Intertrigo 07/09/2017  . AC joint arthropathy 06/16/2017  . Pseudoaneurysm of arteriovenous dialysis fistula ([place]) 12/23/2016  . Eczema 12/10/2016  . Bilateral low back pain without sciatica   . Aortic valve insufficiency   . AIDS ([place])   . Fall 09/08/2016  . Knee abrasion, right, initial encounter 09/08/2016  . Lumbosacral spondylosis without myelopathy 08/03/2016  . Pulmonary nodules 08/03/2016  . Essential hypertension   . Anemia of chronic disease 04/08/2016  . Secondary hyperparathyroidism ([place]) 02/09/2014  . Nuclear sclerotic cataract of both eyes 12/12/2013  . Myopia with presbyopia of both eyes 10/26/2013  . COPD (chronic obstructive pulmonary disease) ([place]) 06/27/2013  . Thrombocytopenia ([place]) 01/09/2013  . HBV (hepatitis B virus) infection 01/09/2013  . Anxiety 07/22/2011  . End stage renal disease on home HD 07/10/2011  . HIV disease ([place]) 06/17/2011    Past Surgical History:  Procedure Laterality Date  . AV FISTULA PLACEMENT  06/02/11   Left radiocephalic AVF  . COLONOSCOPY    . fistulaogram     x 2 last 2 years  . IR FLUORO GUIDE CV LINE RIGHT  09/16/2017  . IR US GUIDE VASC ACCESS RIGHT  09/16/2017  . IRRIGATION AND DEBRIDEMENT KNEE Left 09/15/2017   Procedure: IRRIGATION AND DEBRIDEMENT KNEE; arthroscopic clean out;  Surgeon: [name], MD;  Location: [facility];  Service: Orthopedics;  Laterality: Left;  . OTHER SURGICAL HISTORY     removal temporary HD catheter   . REVISON OF ARTERIOVENOUS FISTULA Left 10/10/2015   Procedure: REVISON OF LEFT RADIOCEPHALIC ARTERIOVENOUS FISTULA;  Surgeon: Angelia Mould, MD;  Location: [facility];   Service: Vascular;  Laterality: Left;  . REVISON OF ARTERIOVENOUS FISTULA Left 02/07/2016   Procedure: REPAIR OF PSEUDO-ANEUREYSM OF LEFT ARM  ARTERIOVENOUS FISTULA;  Surgeon: Angelia Mould, MD;  Location: [facility] [facility];  Service: Vascular;  Laterality: Left;  . REVISON OF ARTERIOVENOUS FISTULA Left 3/82/5053   Procedure: PLICATION OF LEFT ARM RADIOCEPHALIC ARTERIOVENOUS FISTULA PSEUDOANEURYSM;  Surgeon: Angelia Mould, MD;  Location: [place];  Service: Vascular;  Laterality: Left;  . RIGHT/LEFT HEART CATH AND CORONARY ANGIOGRAPHY N/A 02/17/2017   Procedure: Right/Left Heart Cath and Coronary Angiography;  Surgeon: Sherren Mocha, MD;  Location: [place] CV LAB;  Service: Cardiovascular;  Laterality: N/A;  . TEE WITHOUT CARDIOVERSION N/A 09/11/2016   Procedure: TRANSESOPHAGEAL ECHOCARDIOGRAM (TEE);  Surgeon: Dorothy Spark, MD;  Location: [facility] ENDOSCOPY;  Service: Cardiovascular;  Laterality: N/A;       Home Medications    Prior to Admission medications   Medication Sig Start Date End Date Taking? Authorizing Provider  albuterol (PROVENTIL) (2.5 MG/3ML) 0.083% nebulizer solution Take 2.5 mg by nebulization every 6 (six) hours as needed for wheezing or shortness of breath.    [provider]  ALPRAZolam Duanne Moron) 0.25 MG tablet Take 0.25 mg by mouth daily as needed for anxiety.    [provider]  aspirin 325 MG tablet Take 1 tablet (325 mg total) by mouth 2 (two) times daily. 09/16/17   Cecilio Asper, Amber, PA-C  calcium acetate (PHOSLO) 667 MG capsule Take 667-2,001 mg by mouth See admin instructions. 1,334-2,001 mg three times a day with each meal and 667-1,334 mg with each snack 06/06/15   [provider]  carvedilol (COREG) 25 MG tablet Take 25 mg by mouth 2 (two) times daily with a meal.    [provider]  cloNIDine (CATAPRES - DOSED IN MG/24 HR) 0.1 mg/24hr patch UNWRAP AND APPLY 1 PATCH TO SKIN EVERY 7 DAYS Patient taking differently: UNWRAP AND  APPLY 1 PATCH TO SKIN EVERY 7 DAYS (Fridays) 06/28/17   Velna Ochs, MD  clotrimazole-betamethasone (LOTRISONE) cream Apply 1 application topically 2 (two) times daily. 09/08/17   Campbell Riches, MD  daptomycin (CUBICIN) IVPB Inject 760 mg into the vein every other day for 27 days. Administer dose after HD on HD days. Indication:  Septic joint Last Day of Therapy:  10/14/2017 Labs - Once weekly:  CBC/D, CRP, ESR, and CPK Fax weekly labs to [phone] 09/17/17 10/14/17  Carlyle Basques, MD  DAPTOmycin 762.5 mg in sodium chloride 0.9 % 100 mL Inject 762.5 mg into the vein every other day. 09/17/17   Constable, Amber, PA-C  doxazosin (CARDURA) 4 MG tablet Take 4 mg by mouth daily. 07/04/17   [provider]  doxycycline (VIBRA-TABS) 100 MG tablet Take 100 mg  by mouth 2 (two) times daily.    [provider]  epoetin alfa (EPOGEN,PROCRIT) 2000 UNIT/ML injection Inject 2,000 Units into the vein 3 (three) times a week.    [provider]  ethyl chloride spray Apply 1 application topically daily as needed (HD). At HD 05/20/15   [provider]  heparin 1000 UNIT/ML injection Inject 3,000 Units into the vein 3 (three) times a week.    [provider]  hydrALAZINE (APRESOLINE) 100 MG tablet Take 1 tablet (100 mg total) by mouth 2 (two) times daily. 09/22/17   Velna Ochs, MD  HYDROcodone-acetaminophen (NORCO) 10-325 MG tablet Take 1-2 tablets by mouth every 6 (six) hours as needed for severe pain ((score 7 to 10)). 09/16/17   Constable, Amber, PA-C  Ibuprofen-Famotidine (DUEXIS) 800-26.6 MG TABS Take 1 tablet by mouth every 8 (eight) hours as needed (PAIN).    [provider]  ISENTRESS 400 MG tablet TAKE 1 TABLET(400 MG) BY MOUTH TWICE DAILY 05/10/17   Campbell Riches, MD  lamivudine (EPIVIR HBV) 5 MG/ML solution TAKE 5 ML BY MOUTH EVERY DAY Patient taking differently: Take by mouth at bedtime. TAKE 5 ML BY MOUTH EVERY DAY 09/08/17    Campbell Riches, MD  methocarbamol (ROBAXIN) 500 MG tablet Take 1 tablet (500 mg total) by mouth every 8 (eight) hours as needed for muscle spasms. 09/16/17   Constable, Amber, PA-C  multivitamin (RENA-VIT) TABS tablet Take 1 tablet by mouth daily.      [provider]  PROCTOZONE-HC 2.5 % rectal cream Place 1 application rectally 2 (two) times daily as needed. 08/06/17   [provider]  tenofovir (VIREAD) 300 MG tablet TAKE 1 TABLET BY MOUTH EVERY SATURDAY AFTER DIALYSIS 05/03/17   Campbell Riches, MD  traZODone (DESYREL) 50 MG tablet Take 50 mg by mouth at bedtime as needed for sleep.    [provider]  VOLTAREN 1 % GEL APPLY 2 GRAMS EXTERNALLY TO THE AFFECTED AREA FOUR TIMES DAILY 09/08/17   Campbell Riches, MD    Family History Family History  Problem Relation Age of Onset  . Hypertension Mother   . Diabetes Mother   . Cancer Father     Social History Social History   Tobacco Use  . Smoking status: Former Smoker    Years: 10.00    Types: Cigarettes    Last attempt to quit: 08/08/2015    Years since quitting: 2.1  . Smokeless tobacco: Never Used  . Tobacco comment: casual smoking for 10 years  Substance Use Topics  . Alcohol use: No    Alcohol/week: 0.0 oz    Comment: quit 2004  . Drug use: No    Comment: uds (+) cocaine in 08/2011 but pt states was taking sudafed at the time     Allergies   Lisinopril and Penicillins   Review of Systems Review of Systems 10 Systems reviewed and are negative for acute change except as noted in the HPI.   Physical Exam Updated Vital Signs BP (!) 198/84 (BP Location: Right Arm)   Pulse 74   Temp 98.9 F (37.2 C) (Oral)   Resp 18   Ht 6' (1.829 m)   Wt 85 kg (187 lb 6.3 oz)   SpO2 98%   BMI 25.41 kg/m   Physical Exam  Constitutional: He is oriented to person, place, and time.  Patient is alert and nontoxic.  Mental status clear.  No respiratory distress.  HENT:  Head: Normocephalic  and atraumatic.  Mucous membranes are pale in appearance.  Oropharynx is widely patent.  Patient is edentulous.  Eyes: EOM are normal.  Neck: Neck supple.  Cardiovascular: Normal rate.  3\6 systolic ejection murmur.  Pulmonary/Chest: Effort normal and breath sounds normal.  Catheter left anterior chest wall clean dry and intact.  No surrounding erythema.  Abdominal: Soft. He exhibits no distension. There is no tenderness. There is no guarding.  Musculoskeletal: He exhibits no edema or tenderness.  Patient does not have peripheral edema.  Mild swelling of left knee but nontender.  Patient reports is much better.  Dialysis fistula access left forearm.  Neurological: He is alert and oriented to person, place, and time. He exhibits normal muscle tone.  Skin: Skin is warm and dry.  Psychiatric: He has a normal mood and affect.     ED Treatments / Results  Labs (all labs ordered are listed, but only abnormal results are displayed) Labs Reviewed  COMPREHENSIVE METABOLIC PANEL - Abnormal; Notable for the following components:      Result Value   Sodium 133 (*)    Potassium 3.4 (*)    Chloride 95 (*)    Glucose, Bld 125 (*)    BUN 48 (*)    Creatinine, Ser 9.78 (*)    Calcium 8.8 (*)    Albumin 3.0 (*)    ALT 7 (*)    GFR calc non Af Amer 5 (*)    GFR calc Af Amer 6 (*)    All other components within normal limits  CBC - Abnormal; Notable for the following components:   RBC 1.51 (*)    Hemoglobin 5.0 (*)    HCT 15.0 (*)    All other components within normal limits  LIPASE, BLOOD  PROTIME-INR  APTT  POC OCCULT BLOOD, ED  POC OCCULT BLOOD, ED  SAMPLE TO BLOOD BANK  PREPARE RBC (CROSSMATCH)  TYPE AND SCREEN    EKG  EKG Interpretation None       Radiology No results found.  Procedures Procedures (including critical care time) CRITICAL CARE Performed by: Si Gaul   Total critical care time: 20 minutes  Critical care time was exclusive of separately  billable procedures and treating other patients.  Critical care was necessary to treat or prevent imminent or life-threatening deterioration.  Critical care was time spent personally by me on the following activities: development of treatment plan with patient and/or surrogate as well as nursing, discussions with consultants, evaluation of patient's response to treatment, examination of patient, obtaining history from patient or surrogate, ordering and performing treatments and interventions, ordering and review of laboratory studies, ordering and review of radiographic studies, pulse oximetry and re-evaluation of patient's condition. Medications Ordered in ED Medications  0.9 %  sodium chloride infusion (not administered)     Initial Impression / Assessment and Plan / ED Course  I have reviewed the triage vital signs and the nursing notes.  Pertinent labs & imaging results that were available during my care of the patient were reviewed by me and considered in my medical decision making (see chart for details).     Consult: Pending internal medicine resident for admission.  (09: 02) Dr. Andria Meuse accepts for admission.  Final Clinical Impressions(s) / ED Diagnoses   Final diagnoses:  Symptomatic anemia  Severe comorbid illness  Patient is alert and appropriate.  He has become increasingly fatigued and weak.  He is found to be significantly anemic.  Will initiate transfusion for symptomatic anemia.  Patient does not describe active or aggressive GI bleed.  At this time, patient has no hypotension, heart rate is in the 90s, respiratory status is stable and mental status is appropriate.  ED Discharge Orders    None       Charlesetta Shanks, MD 09/23/17 [id number]    Charlesetta Shanks, MD 09/23/17 [phone]

## 2017-09-23 NOTE — Progress Notes (Signed)
Per order from pharmacy, pt has been re-weighed x 2.  Each time, the pt weighed 203.3 lbs.

## 2017-09-23 NOTE — Progress Notes (Addendum)
Spoke with Dr. Tarri Abernethy r/t pt's BP 183/75.  Pt states that he has not received any of his BP medicines today while in the ED.  Presently, there are no orders for any BP medications that the pt usually takes at home.  I relayed all to Dr. Tarri Abernethy,  Dr. Tarri Abernethy states that he will review PTA medications. ______________________________________________  Order received for catapres patch.  No orders received for any PTA BP medications.  Will administer the catapres patch.

## 2017-09-23 NOTE — Consult Note (Signed)
[facility] Renal Consultation Note    Indication for Consultation:  Management of ESRD/hemodialysis, anemia, hypertension/volume, and secondary hyperparathyroidism. PCP:  HPI: Jeffrey [name] is a 62 y.o. male with ESRD on home HD, HIV, HBV, HTN, and hyperthyroidism who is being admitted with symptomatic anemia.   Jeffrey [name] just had L knee arthroscopic wash-out surgery last week on 12/26 d/t septic joint. Aspirate Cx grew Staph. Has been getting IV Daptomycin via PICC per ID recommendations (4 week course planned). Was on dialysis (at home) this morning when he received a call from his ID physician that they had received alert lab that his Hgb was 4.9. He was advised to present to the ED. In hind sight, he thinks maybe he had some fatigue over the past few days, but otherwise has been asymptomatic. Denies CP, dyspnea, N/V, diarrhea. No dark/tarry stools, occ spotty red blood on toilet paper with wiping. No abdominal pain, blurred vision, dizziness.  In ED, labs confirmed Hgb 5, otherwise stable. Tsat 21%, K 3.4, WBC 6.3. He was ordered for blood transfusion and currently receiving 2 units.   From HD standpoint, dialysis on home HD 4 days/week for 3:25hr each time. He does not follow a specific schedule/time as long as he gets the days done each week. This week, he had HD on Sun, Tues, and about 2-1/2 hrs today. He uses a L AVF which he self-cannulates via buttonholes. No prolonged bleeding. He gives himself Epogen 2000 units TIW with HD, thinks that was told to up his dose recently (has written at home, can't remember dose off top of his head).   Past Medical History:  Diagnosis Date  . Anemia   . Anxiety   . Arthritis   . Asthma    per pt hx  . End stage renal disease on home HD 07/10/2011   Started HD in September 2012 at [facility] with a tunneled HD catheter, now on home HD with N[facility]. Dialyzing through AVF L lower arm with buttonhole technique as of mid 2014. His brother does  the HD treatments at home.  They are roommates for 23 years.  The brother works 3rd shift and gets off about 8am and then puts Jeffrey [name] on HD in the morning after getting home. Most of the time he does HD about 4 times a week, for about 4 hours per treatment. Cause of ESRD was HTN according to patient. He says he let his health go and ending up with complications, and that he didn't like seeing doctors in those days.  He says he was diagnosed with severe HTN when he lived in [place] in his 57's.   . Hepatitis B carrier ([place])   . HIV infection ([place])   . Hypertension   . Hyperthyroidism    normal now  . Pneumonia several yrs ago  . Seizure ([place])   . Seizures ([place]) 06/02/2011   x 1 none since  . Thrombocytopenia ([place])    Past Surgical History:  Procedure Laterality Date  . AV FISTULA PLACEMENT  06/02/11   Left radiocephalic AVF  . COLONOSCOPY    . fistulaogram     x 2 last 2 years  . IR FLUORO GUIDE CV LINE RIGHT  09/16/2017  . IR US GUIDE VASC ACCESS RIGHT  09/16/2017  . IRRIGATION AND DEBRIDEMENT KNEE Left 09/15/2017   Procedure: IRRIGATION AND DEBRIDEMENT KNEE; arthroscopic clean out;  Surgeon: [name], MD;  Location: [facility];  Service: Orthopedics;  Laterality: Left;  .  OTHER SURGICAL HISTORY     removal temporary HD catheter   . REVISON OF ARTERIOVENOUS FISTULA Left 10/10/2015   Procedure: REVISON OF LEFT RADIOCEPHALIC ARTERIOVENOUS FISTULA;  Surgeon: Angelia Mould, MD;  Location: [place];  Service: Vascular;  Laterality: Left;  . REVISON OF ARTERIOVENOUS FISTULA Left 02/07/2016   Procedure: REPAIR OF PSEUDO-ANEUREYSM OF LEFT ARM  ARTERIOVENOUS FISTULA;  Surgeon: Angelia Mould, MD;  Location: [place];  Service: Vascular;  Laterality: Left;  . REVISON OF ARTERIOVENOUS FISTULA Left 7/98/9211   Procedure: PLICATION OF LEFT ARM RADIOCEPHALIC ARTERIOVENOUS FISTULA PSEUDOANEURYSM;  Surgeon: Angelia Mould, MD;  Location: [place];  Service: Vascular;  Laterality:  Left;  . RIGHT/LEFT HEART CATH AND CORONARY ANGIOGRAPHY N/A 02/17/2017   Procedure: Right/Left Heart Cath and Coronary Angiography;  Surgeon: Sherren Mocha, MD;  Location: [place] [facility];  Service: Cardiovascular;  Laterality: N/A;  . TEE WITHOUT CARDIOVERSION N/A 09/11/2016   Procedure: TRANSESOPHAGEAL ECHOCARDIOGRAM (TEE);  Surgeon: Dorothy Spark, MD;  Location: [facility] ENDOSCOPY;  Service: Cardiovascular;  Laterality: N/A;   Family History  Problem Relation Age of Onset  . Hypertension Mother   . Diabetes Mother   . Cancer Father    Social History:  reports that he quit smoking about 2 years ago. His smoking use included cigarettes. He quit after 10.00 years of use. he has never used smokeless tobacco. He reports that he does not drink alcohol or use drugs.  ROS: As per HPI otherwise negative.  Physical Exam: Vitals:   09/23/17 1330 09/23/17 1345 09/23/17 1400 09/23/17 1439  BP: (!) 169/66 (!) 181/72    Pulse: 72 79  73  Resp: _0 Temp:   98.7 F (37.1 C) 98.7 F (37.1 C)  TempSrc:   Oral Oral  SpO2: 100% 100%  100%  Weight:      Height:         General: Well developed, well nourished, in no acute distress. Head: Normocephalic, atraumatic, sclera non-icteric, mucus membranes are moist. Neck: Supple without lymphadenopathy/masses. Lungs: Clear bilaterally to auscultation without wheezes, rales, or rhonchi. Breathing is unlabored. Heart: RRR with normal S1, S2. No murmurs, rubs, or gallops appreciated. Abdomen: Soft, non-tender, non-distended with normoactive bowel sounds. No rebound/guarding. No obvious abdominal masses. Musculoskeletal:  Strength and tone appear normal for age. Lower extremities: No edema or ischemic changes, no open wounds. 2 stitched areas on L knee s/p recent surgery. Neuro: Alert and oriented X 3. Moves all extremities spontaneously. Psych:  Responds to questions appropriately with a normal affect. Dialysis Access: L AVF +  thrill  Allergies  Allergen Reactions  . Lisinopril Anaphylaxis and Shortness Of Breath    Throat swelling  . Penicillins Other (See Comments)    Childhood allergy Has patient had a PCN reaction causing immediate rash, facial/tongue/throat swelling, SOB or lightheadedness with hypotension: Unk Has patient had a PCN reaction causing severe rash involving mucus membranes or skin necrosis: Unk Has patient had a PCN reaction that required hospitalization: Unk Has patient had a PCN reaction occurring within the last 10 years: No If all of the above answers are "NO", then may proceed with Cephalosporin use.    Prior to Admission medications   Medication Sig Start Date End Date Taking? Authorizing Provider  albuterol (PROVENTIL) (2.5 MG/3ML) 0.083% nebulizer solution Take 2.5 mg by nebulization every 6 (six) hours as needed for wheezing or shortness of breath.   Yes [provider]  ALPRAZolam Duanne Moron) 0.25 MG  tablet Take 0.25 mg by mouth daily as needed for anxiety.   Yes [provider]  calcium acetate (PHOSLO) 667 MG capsule Take 667-2,001 mg by mouth See admin instructions. 1,334-2,001 mg three times a day with each meal and 667-1,334 mg with each snack 06/06/15  Yes [provider]  carvedilol (COREG) 25 MG tablet Take 25 mg by mouth 2 (two) times daily with a meal.   Yes [provider]  cloNIDine (CATAPRES - DOSED IN MG/24 HR) 0.1 mg/24hr patch UNWRAP AND APPLY 1 PATCH TO SKIN EVERY 7 DAYS Patient taking differently: UNWRAP AND APPLY 1 PATCH TO SKIN EVERY 7 DAYS (Fridays) 06/28/17  Yes Velna Ochs, MD  clotrimazole-betamethasone (LOTRISONE) cream Apply 1 application topically 2 (two) times daily. 09/08/17  Yes Campbell Riches, MD  daptomycin (CUBICIN) IVPB Inject 760 mg into the vein every other day for 27 days. Administer dose after HD on HD days. Indication:  Septic joint Last Day of Therapy:  10/14/2017 Labs - Once weekly:  CBC/D, CRP, ESR, and  CPK Fax weekly labs to [phone] 09/17/17 10/14/17 Yes Carlyle Basques, MD  doxazosin (CARDURA) 4 MG tablet Take 4 mg by mouth daily. 07/04/17  Yes [provider]  epoetin alfa (EPOGEN,PROCRIT) 2000 UNIT/ML injection Inject 2,000 Units into the vein 3 (three) times a week.   Yes [provider]  ethyl chloride spray Apply 1 application topically daily as needed (HD). At HD 05/20/15  Yes [provider]  heparin 1000 UNIT/ML injection Inject 3,000 Units into the vein 3 (three) times a week.   Yes [provider]  hydrALAZINE (APRESOLINE) 100 MG tablet Take 1 tablet (100 mg total) by mouth 2 (two) times daily. Patient taking differently: Take 100 mg by mouth 3 (three) times daily.  09/22/17  Yes Velna Ochs, MD  HYDROcodone-acetaminophen (NORCO) 10-325 MG tablet Take 1-2 tablets by mouth every 6 (six) hours as needed for severe pain ((score 7 to 10)). 09/16/17  Yes Constable, Amber, PA-C  ISENTRESS 400 MG tablet TAKE 1 TABLET(400 MG) BY MOUTH TWICE DAILY 05/10/17  Yes Campbell Riches, MD  lamivudine (EPIVIR HBV) 5 MG/ML solution TAKE 5 ML BY MOUTH EVERY DAY Patient taking differently: Take by mouth at bedtime. TAKE 5 ML BY MOUTH EVERY DAY 09/08/17  Yes Campbell Riches, MD  methocarbamol (ROBAXIN) 500 MG tablet Take 1 tablet (500 mg total) by mouth every 8 (eight) hours as needed for muscle spasms. 09/16/17  Yes Constable, Amber, PA-C  multivitamin (RENA-VIT) TABS tablet Take 1 tablet by mouth daily.     Yes [provider]  oxyCODONE-acetaminophen (PERCOCET/ROXICET) 5-325 MG tablet Take 1 tablet by mouth every 6 (six) hours as needed for severe pain. 08/26/17  Yes [provider]  PROCTOZONE-HC 2.5 % rectal cream Place 1 application rectally 2 (two) times daily as needed. 08/06/17  Yes [provider]  tenofovir (VIREAD) 300 MG tablet TAKE 1 TABLET BY MOUTH EVERY SATURDAY AFTER DIALYSIS 05/03/17  Yes Campbell Riches, MD   traZODone (DESYREL) 50 MG tablet Take 50 mg by mouth at bedtime as needed for sleep.   Yes [provider]  VOLTAREN 1 % GEL APPLY 2 GRAMS EXTERNALLY TO THE AFFECTED AREA FOUR TIMES DAILY 09/08/17  Yes Campbell Riches, MD  aspirin 325 MG tablet Take 1 tablet (325 mg total) by mouth 2 (two) times daily. 09/16/17   Constable, Amber, PA-C  DAPTOmycin 762.5 mg in sodium chloride 0.9 % 100 mL Inject  762.5 mg into the vein every other day. 09/17/17   Constable, Amber, PA-C  doxycycline (VIBRA-TABS) 100 MG tablet Take 100 mg by mouth 2 (two) times daily.    [provider]   No current facility-administered medications for this encounter.    Current Outpatient Medications  Medication Sig Dispense Refill  . albuterol (PROVENTIL) (2.5 MG/3ML) 0.083% nebulizer solution Take 2.5 mg by nebulization every 6 (six) hours as needed for wheezing or shortness of breath.    . ALPRAZolam (XANAX) 0.25 MG tablet Take 0.25 mg by mouth daily as needed for anxiety.    . calcium acetate (PHOSLO) 667 MG capsule Take 667-2,001 mg by mouth See admin instructions. 1,334-2,001 mg three times a day with each meal and 667-1,334 mg with each snack    . carvedilol (COREG) 25 MG tablet Take 25 mg by mouth 2 (two) times daily with a meal.    . cloNIDine (CATAPRES - DOSED IN MG/24 HR) 0.1 mg/24hr patch UNWRAP AND APPLY 1 PATCH TO SKIN EVERY 7 DAYS (Patient taking differently: UNWRAP AND APPLY 1 PATCH TO SKIN EVERY 7 DAYS (Fridays)) 4 patch 2  . clotrimazole-betamethasone (LOTRISONE) cream Apply 1 application topically 2 (two) times daily. 30 g 0  . daptomycin (CUBICIN) IVPB Inject 760 mg into the vein every other day for 27 days. Administer dose after HD on HD days. Indication:  Septic joint Last Day of Therapy:  10/14/2017 Labs - Once weekly:  CBC/D, CRP, ESR, and CPK Fax weekly labs to [phone] Units 0  . doxazosin (CARDURA) 4 MG tablet Take 4 mg by mouth daily.  8  . epoetin alfa (EPOGEN,PROCRIT)  2000 UNIT/ML injection Inject 2,000 Units into the vein 3 (three) times a week.    . ethyl chloride spray Apply 1 application topically daily as needed (HD). At HD  12  . heparin 1000 UNIT/ML injection Inject 3,000 Units into the vein 3 (three) times a week.    . hydrALAZINE (APRESOLINE) 100 MG tablet Take 1 tablet (100 mg total) by mouth 2 (two) times daily. (Patient taking differently: Take 100 mg by mouth 3 (three) times daily. ) 60 tablet 1  . HYDROcodone-acetaminophen (NORCO) 10-325 MG tablet Take 1-2 tablets by mouth every 6 (six) hours as needed for severe pain ((score 7 to 10)). 60 tablet 0  . ISENTRESS 400 MG tablet TAKE 1 TABLET(400 MG) BY MOUTH TWICE DAILY 60 tablet 3  . lamivudine (EPIVIR HBV) 5 MG/ML solution TAKE 5 ML BY MOUTH EVERY DAY (Patient taking differently: Take by mouth at bedtime. TAKE 5 ML BY MOUTH EVERY DAY) 240 mL 11  . methocarbamol (ROBAXIN) 500 MG tablet Take 1 tablet (500 mg total) by mouth every 8 (eight) hours as needed for muscle spasms. 40 tablet 1  . multivitamin (RENA-VIT) TABS tablet Take 1 tablet by mouth daily.      Marland Kitchen oxyCODONE-acetaminophen (PERCOCET/ROXICET) 5-325 MG tablet Take 1 tablet by mouth every 6 (six) hours as needed for severe pain.  0  . PROCTOZONE-HC 2.5 % rectal cream Place 1 application rectally 2 (two) times daily as needed.  6  . tenofovir (VIREAD) 300 MG tablet TAKE 1 TABLET BY MOUTH EVERY SATURDAY AFTER DIALYSIS 5 tablet 4  . traZODone (DESYREL) 50 MG tablet Take 50 mg by mouth at bedtime as needed for sleep.    . VOLTAREN 1 % GEL APPLY 2 GRAMS EXTERNALLY TO THE AFFECTED AREA FOUR TIMES DAILY 100 g 1  . aspirin 325 MG tablet  Take 1 tablet (325 mg total) by mouth 2 (two) times daily. 30 tablet 0  . DAPTOmycin 762.5 mg in sodium chloride 0.9 % 100 mL Inject 762.5 mg into the vein every other day. 1 Dose 0  . doxycycline (VIBRA-TABS) 100 MG tablet Take 100 mg by mouth 2 (two) times daily.     Labs: Basic Metabolic Panel: Recent Labs  Lab  09/17/17 0544 09/23/17 0600  NA 136 133*  K 4.8 3.4*  CL 98* 95*  CO2 22 26  GLUCOSE 111* 125*  BUN 103* 48*  CREATININE 17.02* 9.78*  CALCIUM 7.9* 8.8*   Liver Function Tests: Recent Labs  Lab 09/23/17 0600  AST 16  ALT 7*  ALKPHOS 58  BILITOT 0.6  PROT 6.6  ALBUMIN 3.0*   Recent Labs  Lab 09/23/17 0600  LIPASE 24   CBC: Recent Labs  Lab 09/23/17 0600  WBC 6.3  HGB 5.0*  HCT 15.0*  MCV 99.3  PLT 158   Iron Studies:  Recent Labs    09/23/17 1018  IRON 28*  TIBC 136*  FERRITIN 851*   Dialysis Orders:  Home HD 4d/week (3:63mn per HD). Followed by [facility] (Dr. LHolley Raringis primary nephrologist). Will get records.  Assessment/Plan: 1.  Symptomatic anemia: Hgb 5 on admit. No symptoms of GI losses. Would check guaiacs, but suspect combination of ESA resistance in setting of infection and recent surgery (Hgb 7.1 prior to surgery). Getting 3u PRBCs today. Will give dose of Aranesp 1538m tomorrow as well. 2.  ESRD: on home HD 4 days/week. S/p HD Sun, Tues, and partial this morning. No acute need for HD currently, likely will need HD tomorrow after getting 3U PRBCs (volume), will reassess in AM. 3.  Hypertension/volume: BP high, see above. Likely HD tomorrow. 4.  Metabolic bone disease: Ca ok. Resume home binders. 5.  HIV 6.  HBV 7.  Recent septic L knee: On Daptomycin IV with 4 week course planned. Knee aspirate grew rare Coag neg staph, Cx from knee washout on 12/26 negative so far. Consider touching base with ID to make sure plan is the same.  KaVeneta PentonPA-C 09/23/2017, 2:56 PM  [facility] Pager: [phone] Attending: Anemia of multiple causes potentially as described above.  Will also give iron. I agree with the note above. AlEstanislado Emms

## 2017-09-23 NOTE — Plan of Care (Signed)
  Education: Knowledge of General Education information will improve 09/23/2017 1751 - Progressing by Imagene Gurney, RN

## 2017-09-23 NOTE — Telephone Encounter (Signed)
I received a critical lab result on the patient of a Hgb of 4.9.  He is on a blood thinner.  He will be advised via [place] to go directly to the emergency room for evaluation.   Thayer Headings, MD

## 2017-09-23 NOTE — Progress Notes (Signed)
[facility]  Jeffrey [name] is an active [place] [facility] pt with [facility]. We are providing HHRN and Home IV ABX for pt up to this readmission.  [facility] team will follow pt while here to support transition home when ordered.  If patient discharges after hours, please call [phone].   [name] 09/23/2017, 9:02 AM

## 2017-09-23 NOTE — ED Triage Notes (Signed)
The pt is a dialysis pat due for dialysis today  He was called at home and told he had a hgb of 3-9 he just had  Knee surgery last Wednesday  He has a port in his rt upper chest for iv antibiotics.. He has had some dark colored stools

## 2017-09-23 NOTE — ED Notes (Signed)
ED Provider at bedside. 

## 2017-09-24 ENCOUNTER — Inpatient Hospital Stay (HOSPITAL_COMMUNITY): Payer: Medicare Other

## 2017-09-24 ENCOUNTER — Other Ambulatory Visit: Payer: Self-pay | Admitting: Pharmacist

## 2017-09-24 DIAGNOSIS — D649 Anemia, unspecified: Secondary | ICD-10-CM

## 2017-09-24 DIAGNOSIS — R918 Other nonspecific abnormal finding of lung field: Secondary | ICD-10-CM

## 2017-09-24 DIAGNOSIS — Z992 Dependence on renal dialysis: Secondary | ICD-10-CM

## 2017-09-24 DIAGNOSIS — I120 Hypertensive chronic kidney disease with stage 5 chronic kidney disease or end stage renal disease: Principal | ICD-10-CM

## 2017-09-24 DIAGNOSIS — Z9889 Other specified postprocedural states: Secondary | ICD-10-CM

## 2017-09-24 DIAGNOSIS — B20 Human immunodeficiency virus [HIV] disease: Secondary | ICD-10-CM

## 2017-09-24 DIAGNOSIS — M009 Pyogenic arthritis, unspecified: Secondary | ICD-10-CM

## 2017-09-24 DIAGNOSIS — N186 End stage renal disease: Secondary | ICD-10-CM

## 2017-09-24 DIAGNOSIS — B1910 Unspecified viral hepatitis B without hepatic coma: Secondary | ICD-10-CM

## 2017-09-24 DIAGNOSIS — Z8614 Personal history of Methicillin resistant Staphylococcus aureus infection: Secondary | ICD-10-CM

## 2017-09-24 LAB — RENAL FUNCTION PANEL
Albumin: 2.8 g/dL — ABNORMAL LOW (ref 3.5–5.0)
Anion gap: 10 (ref 5–15)
BUN: 61 mg/dL — ABNORMAL HIGH (ref 6–20)
CO2: 25 mmol/L (ref 22–32)
Calcium: 8.2 mg/dL — ABNORMAL LOW (ref 8.9–10.3)
Chloride: 99 mmol/L — ABNORMAL LOW (ref 101–111)
Creatinine, Ser: 12.38 mg/dL — ABNORMAL HIGH (ref 0.61–1.24)
GFR calc Af Amer: 4 mL/min — ABNORMAL LOW (ref >60)
GFR calc non Af Amer: 4 mL/min — ABNORMAL LOW (ref >60)
Glucose, Bld: 98 mg/dL (ref 65–99)
Phosphorus: 3.7 mg/dL (ref 2.5–4.6)
Potassium: 4.1 mmol/L (ref 3.5–5.1)
Sodium: 134 mmol/L — ABNORMAL LOW (ref 135–145)

## 2017-09-24 LAB — PREPARE RBC (CROSSMATCH)

## 2017-09-24 LAB — HEMOGLOBIN AND HEMATOCRIT, BLOOD
HCT: 21.3 % — ABNORMAL LOW (ref 39.0–52.0)
Hemoglobin: 7.3 g/dL — ABNORMAL LOW (ref 13.0–17.0)

## 2017-09-24 LAB — CBC
HCT: 19.1 % — ABNORMAL LOW (ref 39.0–52.0)
Hemoglobin: 6.4 g/dL — CL (ref 13.0–17.0)
MCH: 31.2 pg (ref 26.0–34.0)
MCHC: 33.5 g/dL (ref 30.0–36.0)
MCV: 93.2 fL (ref 78.0–100.0)
Platelets: 158 10*3/uL (ref 150–400)
RBC: 2.05 MIL/uL — ABNORMAL LOW (ref 4.22–5.81)
RDW: 16.5 % — ABNORMAL HIGH (ref 11.5–15.5)
WBC: 8 10*3/uL (ref 4.0–10.5)

## 2017-09-24 LAB — MRSA PCR SCREENING: MRSA by PCR: NEGATIVE

## 2017-09-24 LAB — HEPATITIS B SURFACE ANTIGEN: Hepatitis B Surface Ag: POSITIVE — AB

## 2017-09-24 MED ORDER — ACETAMINOPHEN 325 MG PO TABS
ORAL_TABLET | ORAL | Status: AC
  Filled 2017-09-24: qty 2

## 2017-09-24 MED ORDER — SODIUM CHLORIDE 0.9 % IV SOLN
100.0000 mL | INTRAVENOUS | Status: DC | PRN
Start: 2017-09-24 — End: 2017-09-24

## 2017-09-24 MED ORDER — LIDOCAINE-PRILOCAINE 2.5-2.5 % EX CREA: 1.0000 "application " | TOPICAL_CREAM | CUTANEOUS | Status: DC | PRN

## 2017-09-24 MED ORDER — SODIUM CHLORIDE 0.9 % IV SOLN: 100.0000 mL | INTRAVENOUS | Status: DC | PRN

## 2017-09-24 MED ORDER — ACETAMINOPHEN 325 MG PO TABS
650.0000 mg | ORAL_TABLET | Freq: Four times a day (QID) | ORAL | Status: DC | PRN
  Administered 2017-09-24 – 2017-09-25 (×3): 650 mg via ORAL
  Filled 2017-09-24 (×2): qty 2

## 2017-09-24 MED ORDER — HYDRALAZINE HCL 50 MG PO TABS
100.0000 mg | ORAL_TABLET | Freq: Three times a day (TID) | ORAL | Status: DC
  Administered 2017-09-24 – 2017-09-25 (×5): 100 mg via ORAL
  Filled 2017-09-24 (×6): qty 2

## 2017-09-24 MED ORDER — CARVEDILOL 25 MG PO TABS
25.0000 mg | ORAL_TABLET | Freq: Two times a day (BID) | ORAL | Status: DC
  Administered 2017-09-24 – 2017-09-26 (×4): 25 mg via ORAL
  Filled 2017-09-24 (×4): qty 1

## 2017-09-24 MED ORDER — PENTAFLUOROPROP-TETRAFLUOROETH EX AERO: 1.0000 "application " | INHALATION_SPRAY | CUTANEOUS | Status: DC | PRN

## 2017-09-24 MED ORDER — LIDOCAINE HCL (PF) 1 % IJ SOLN: 5.0000 mL | INTRAMUSCULAR | Status: DC | PRN

## 2017-09-24 MED ORDER — SODIUM CHLORIDE 0.9 % IV SOLN: Freq: Once | INTRAVENOUS | Status: DC

## 2017-09-24 NOTE — Progress Notes (Signed)
Paged MD on call about BP and last hemoglobin 6.7 after 3 units of blood where given.  Since there is concern for fluid overload, patient is stable, not bleeding and currently asymptomatic, additional unit of blood will be given this morning during hemodialysis. Patient informed and verbalised understanding. Blood bank informed as well.No BP medication at this time due to anemia per Wandra Feinstein, MD  Will continue to monitor.  Shadana Pry, RN

## 2017-09-24 NOTE — Progress Notes (Signed)
   Subjective: Patient doing well and continue to be asymptomatic. He denies any pain or overt bleeding. He denies any bloody bowel movements. He states that he has had similar situations in the past when he had infections and believes this may be similar. He otherwise feels well and tolerated the unit of pRBCs this AM. We will recheck his Hgb this evening and monitor overnight. All questions and concerns addressed.   Objective: Vital signs in last 24 hours: Vitals:   09/23/17 2059 09/23/17 2232 09/24/17 0032 09/24/17 0430  BP: (!) 157/77 (!) 170/79 (!) 172/84 (!) 186/83  Pulse: 78  80 77  Resp: 18  18 18   Temp: 99.8 F (37.7 C)  (!) 97.5 F (36.4 C) (!) 100.4 F (38 C)  TempSrc: Oral  Oral Oral  SpO2: 98%  98% 98%  Weight:    207 lb 3.2 oz (94 kg)  Height:       General: Well nourished male in no acute distress Pulm: Good air movement with no wheezing or crackles  CV: RRR, no murmurs, no rubs  Abdomen: Active bowel sounds, soft, non-distended  Extremities: No LE edema, no swelling around the left knee Skin: No ecchymosis noted  Assessment/Plan:  Acute on chronic anemia - Patient doing well this morning he continues to be asymptomatic from his anemia - Patient arrived with a hemoglobin of 5.0 and subsequently received 3 units of packed red blood cells. Hemoglobin is now 6.7. Patient tolerated transfusions well, but does appear to experience a post transfusion fever. Another unit has been ordered and will be transfused during hemodialysis.  - His underlying anemia is likely due to anemia of chronic disease secondary to his end-stage renal disease. With his recent surgery it is possible that his acute on chronic anemia secondary to blood loss; however, the most likely etiology is decreased production/ESA resistance in the setting of infection and recent surgery. This is supported by the patient's inappropriately normal reticulocyte count. - Nephrology is on board and will give EPO and  iron during hemodialysis - Repeat H&H this evening   Recent left knee septic arthritis status post arthrocentesis with irrigation and drainage - Aspiration on 12/10 grew MRSA and patient was initially. Placed on doxycycline however symptoms worsened and he subsequently went for arthrocentesis on 12/26. He was transitioned to IV daptomycin for 4 weeks. We will continue his daptomycin is in the hospital  ESRD on home HD - Nephrology on board. Patient will go for hemodialysis today  HIV - Continue HARRT therapy  - Viral load and CD4 count pending   Hypertension - Restarted clonidine patch yesterday. - Continues to be uncontrolled. Holding further medications until patient is going for hemodialysis and anemia stabilized.  Lung nodules - Bilateral pulmonary nodules noted on recent imaging in 7/18 - Will obtain CT chest while hospitalized to further characterize  Dispo: Anticipated discharge in approximately 1-2 day(s).   Ina Homes, MD 09/24/2017, 5:46 AM My Pager: [phone]

## 2017-09-24 NOTE — Plan of Care (Signed)
  Education: Knowledge of General Education information will improve 09/24/2017 1856 - Progressing by Imagene Gurney, RN

## 2017-09-24 NOTE — Care Management Note (Signed)
Case Management Note  Patient Details  Name: Jeffrey [name] MRN: [id number] Date of Birth: 03-Dec-1955  Subjective/Objective:  Admitted with symptomatic anemia.               Action/Plan: Prior to admission patient lived at home with brother.  Prior to admission patient active with [facility] Agency: [place] for the following:  HHRN and Home IV ABX for pt up to this readmission; and Home Infusion Pharmacy. NCM to continue to follow for discharge needs.  Expected Discharge Date:  09/25/17               Expected Discharge Plan:  [place]  Discharge planning Services  CM Consult  Status of Service:  In process, will continue to follow  Montel Culver, BSN, RN Case Manager-Orientation [phone] 09/24/2017, 2:18 PM

## 2017-09-24 NOTE — Progress Notes (Signed)
Spoke with HD staff, will request patient to be first case in the morning due to blood transfusion.  Autumne Kallio, RN

## 2017-09-24 NOTE — Consult Note (Signed)
   [facility] CM Inpatient Consult   09/24/2017  Jeffrey [name] 06/16/1956 [id number]  Patient has previously been outreached by [facility] Care Management Health Coach for ED screens in the Medicare ACO.  Patient admitted with anemia.  Chart reviewed reveals patient has declined [facility] Care Management services last month.  Met with patient and his brother at bedside regarding post hospital follow up needs and [facility] Care Management services.  He states, "I got some information in the mail." Patient verbalizes no needs at this time.  A brochure, 24 hour nurse advise line magnet and contact information provided.  For questions, please contact:   , RN BSN CCM [facility] Hospital Liaison  [phone] business mobile phone Toll free office [phone]      

## 2017-09-24 NOTE — Progress Notes (Addendum)
BP 188/91, HR 88.  Have paged Dr. Tarri Abernethy.  Waiting for return call. ____________________________________________  Order received for Hydralazine 100mg  PO TiD

## 2017-09-24 NOTE — Progress Notes (Signed)
BP continues to be elevated, Last set of VS BP-186/83, Temperature 100.4.  Paged resident on call La[place]ce and informed her about BP and temp.  Patient awaiting HD and  4th unit of blood to be transfused during HD. No PRN to give, asked for Tylenol. Will continue to monitor.  Pasqualino Witherspoon, RN

## 2017-09-24 NOTE — Procedures (Signed)
[name] treatment BP generous. He is alert and appropriate Hgb 6.4 grams this AM after PRBCs X 3 yesterday I reviewed knee surgery report and only 10cc of EBL. Pt will receive another unit of PRBCs during HD. Estanislado Emms

## 2017-09-24 NOTE — Progress Notes (Signed)
Report given to HD staff, patient will be transported before/around 8 am.   Will continue to monitor.  Crandall Harvel, RN

## 2017-09-24 NOTE — Progress Notes (Signed)
  Date: 09/24/2017  Patient name: Jeffrey [name]  Medical record number: [id number]  Date of birth: 1956/03/24   I have seen and evaluated this patient and I have discussed the plan of care with the house staff. Please see their note for complete details. I concur with their findings with the following additions/corrections:   Mr. Is a 62 year old male with a history of ESRD on home HD, HIV, hepatitis B, chronic anemia, and recent left knee septic arthritis who was admitted after being found incidentally to have hemoglobin of 4.9. He has been on daptomycin for the recent septic arthritis, which he reports has improved significantly after debridement on 12/26.  He had no symptoms with the significant anemia, including no tachycardia on presentation. On review, he admits to maybe being slightly fatigued, but no other changes. He has not noticed any bleeding, melena or bloody stools, hematemesis, hematuria, or difficulties with his HD at home. He casually has a small amount of bright red blood on the toilet paper when he wipes, but never any more than that.  On presentation here, he had a hemoglobin of 5.0 and was transfused 3 units. His hemoglobin subsequently came up to 6.7, then dropped to 6.4 this morning. He received another unit with dialysis this morning.  We examined him on rounds this morning, he was still at dialysis and reported feeling well. He continues to have no symptoms and no tachycardia with normal vital signs.  The etiology of his acute on chronic anemia appears to be decreased production, likely in the context of his recent septic arthritis. His reticulocyte percentage was inappropriately normal at 3.2%, giving him a reticulocyte index of 0.4, and this was measured after transfusion which may have artificially elevated it. He is iron replete with a ferritin of 851 and has no signs of hemolysis with a normal LDH and bilirubin. It is unclear why he did not have a more appropriate  response to the 3 units that were transfused. He has no signs of active bleeding, and his left knee does not have any signs of swelling or hemarthrosis. He has no flank bruising to suggest retroperitoneal hemorrhage and no abdominal symptoms. If he is unable to maintain his hemoglobin, we may need to pursue an abdominal CT to further evaluate his anemia.  Oda Kilts, MD 09/24/2017, 2:37 PM

## 2017-09-25 LAB — BPAM RBC
BLOOD PRODUCT EXPIRATION DATE: 201901102359
BLOOD PRODUCT EXPIRATION DATE: 201901282359
Blood Product Expiration Date: 201901112359
Blood Product Expiration Date: 201901122359
ISSUE DATE / TIME: 201901031412
ISSUE DATE / TIME: 201901031005
ISSUE DATE / TIME: 201901031646
ISSUE DATE / TIME: 201901040924
UNIT TYPE AND RH: 9500
UNIT TYPE AND RH: 9500
UNIT TYPE AND RH: 9500
Unit Type and Rh: 9500

## 2017-09-25 LAB — TYPE AND SCREEN
ABO/RH(D): O NEG
ANTIBODY SCREEN: NEGATIVE
UNIT DIVISION: 0
Unit division: 0
Unit division: 0
Unit division: 0

## 2017-09-25 LAB — CBC
HCT: 25.3 % — ABNORMAL LOW (ref 39.0–52.0)
Hemoglobin: 9 g/dL — ABNORMAL LOW (ref 13.0–17.0)
MCH: 33.3 pg (ref 26.0–34.0)
MCHC: 35.6 g/dL (ref 30.0–36.0)
MCV: 93.7 fL (ref 78.0–100.0)
Platelets: 158 10*3/uL (ref 150–400)
RBC: 2.7 MIL/uL — ABNORMAL LOW (ref 4.22–5.81)
RDW: 16.5 % — ABNORMAL HIGH (ref 11.5–15.5)
WBC: 6 10*3/uL (ref 4.0–10.5)

## 2017-09-25 LAB — BASIC METABOLIC PANEL
Anion gap: 11 (ref 5–15)
BUN: 31 mg/dL — ABNORMAL HIGH (ref 6–20)
CO2: 28 mmol/L (ref 22–32)
Calcium: 8.3 mg/dL — ABNORMAL LOW (ref 8.9–10.3)
Chloride: 96 mmol/L — ABNORMAL LOW (ref 101–111)
Creatinine, Ser: 8.58 mg/dL — ABNORMAL HIGH (ref 0.61–1.24)
GFR calc Af Amer: 7 mL/min — ABNORMAL LOW (ref >60)
GFR calc non Af Amer: 6 mL/min — ABNORMAL LOW (ref >60)
Glucose, Bld: 110 mg/dL — ABNORMAL HIGH (ref 65–99)
Potassium: 3.5 mmol/L (ref 3.5–5.1)
Sodium: 135 mmol/L (ref 135–145)

## 2017-09-25 NOTE — Progress Notes (Addendum)
   Subjective: Patient doing well and continue to be asymptomatic. He denies any sort of blood loss overnight. States he's had similar episodes of anemia during times of infection and believes this to be similar. In addition to his recent septic joint, he tells me he's had what feels like a "cold" over the past few days. He tolerated transfusions well although does not its not unusual for him to get a fever. He requests discharge home.   Objective: Vital signs in last 24 hours: Vitals:   09/24/17 1531 09/24/17 1942 09/25/17 0022 09/25/17 0433  BP: (!) 188/91 (!) 177/76 (!) 144/58 (!) 154/62  Pulse: 88 80 82 78  Resp: 18 18 18 18   Temp:  99.9 F (37.7 C) 99.6 F (37.6 C) (!) 100.5 F (38.1 C)  TempSrc:  Oral Oral Oral  SpO2: 100% 100% 98% 96%  Weight:    199 lb 9.6 oz (90.5 kg)  Height:       General: Well nourished male in no acute distress Pulm: Good air movement with no wheezing or crackles  CV: RRR, no murmurs, no rubs  Abdomen: Active bowel sounds, soft, non-distended  Extremities: No LE erythema, increased warmth or peripheral edema Skin: No ecchymosis noted  Assessment/Plan:  Acute on chronic anemia Mr. Jurgens is doing well this morning and denies any SOB, fatigue or weakness. His Hb this morning is 9, following 4 units of PRBs. His underlying anemia is likely multifactorial in nature (anemia of chronic disease related to his ESRD, recent surgery and recent infections). He received EPO and iron during HD. He often experiences post-transfusion fevers and did have low-grade fever overnight however given the patients history of septic knee with MRSA, I would like to observe him overnight. I obtained blood cultures which I will follow-up.   Recent left knee septic arthritis status post arthrocentesis with irrigation and drainage, MRSA Knee examination stable. Some mild swelling but no tenderness, erythema or increased warmth. He is continued on his 4 week course of IV Daptomycin  while hospitalized.   ESRD on home HD Nephrology on board. He tolerated yesterdays HD + infusion well although did have slight post-transfusion fever which has since resolved. HD per nephro recs.  HIV Continue HARRT therapy   Hypertension Patient appreciates improvement in his BP since several of his home medications have been re-started. Coreg added last night with improvement of BP.   Lung nodules Bilateral pulmonary nodules unchanged from prior CT. Repeat CT recommended in 18-24 months should concern remain high.   Dispo: Anticipated discharge tomorrow.  Laia Wiley, DO 09/25/2017, 11:13 AM My Pager: [phone]

## 2017-09-25 NOTE — Progress Notes (Signed)
Patient slept well last night, without complains or concerns . Temperature slightly elevated again this morning,Tylenol given.  Will continue to monitor.  Xiomara Sevillano, RN

## 2017-09-25 NOTE — Plan of Care (Signed)
  Clinical Measurements: Ability to maintain clinical measurements within normal limits will improve 09/25/2017 0953 - Progressing by Imagene Gurney, RN

## 2017-09-25 NOTE — Progress Notes (Signed)
He is being discharged and will follow up with outpt nephrologist Dr. Candiss Norse and continue home hemodialysis. Jeffrey [name]

## 2017-09-26 DIAGNOSIS — Z8619 Personal history of other infectious and parasitic diseases: Secondary | ICD-10-CM

## 2017-09-26 DIAGNOSIS — Z888 Allergy status to other drugs, medicaments and biological substances status: Secondary | ICD-10-CM

## 2017-09-26 DIAGNOSIS — Z880 Allergy status to penicillin: Secondary | ICD-10-CM

## 2017-09-26 NOTE — Progress Notes (Signed)
   Subjective: Patient doing well. Had hard time sleeping in the hospital last night though. He denies any fever or blood loss. Looking forward to resuming home HD today.   Objective: Vital signs in last 24 hours: Vitals:   09/25/17 0433 09/25/17 1157 09/25/17 1922 09/26/17 0428  BP: (!) 154/62 (!) 159/55 (!) 154/65 106/80  Pulse: 78 70 69 75  Resp: 18 18 18 18   Temp: (!) 100.5 F (38.1 C) 98.6 F (37 C) 99.2 F (37.3 C) 99.5 F (37.5 C)  TempSrc: Oral Oral Oral Oral  SpO2: 96% 99% 98% 98%  Weight: 199 lb 9.6 oz (90.5 kg)   202 lb 1.6 oz (91.7 kg)  Height:       General: Well nourished male in no acute distress, resting comfortably. Brother at bedside. Pulm: Good air movement with no wheezing or crackles  CV: RRR, no murmur Abdomen: Active bowel sounds, soft Extremities: No LE erythema, increased warmth or peripheral edema of knee.  Assessment/Plan:  Acute on chronic anemia Mr. Nudd continues to feel well this morning. He was transfused 4 units with resulting Hb stable at 9 yesterday morning. He denies any blood loss. He was kept overnight as he was febrile yesterday morning, though likely related to his transfusions, given his history of MRSA septic joint. He remains afebrile today and will be discharged home. He will have his Hb followed-up in our clinic.   Recent left knee septic arthritis status post arthrocentesis with irrigation and drainage, MRSA Knee examination stable. Some mild swelling but no tenderness, erythema or increased warmth. He is continued on his 4 week course of IV Daptomycin while hospitalized.   ESRD on home HD Tolerated HD while in hospital. Will resume his home HD schedule at discharge.   HIV Continue HARRT therapy   Hypertension Patient appreciates improvement in his BP since several of his home medications have been re-started.   Lung nodules Bilateral pulmonary nodules unchanged from prior CT. Repeat CT recommended in 18-24 months should  concern remain high.   Dispo: Anticipated discharge today.  Rosetta Rupnow, Romelle Starcher, DO 09/26/2017, 6:38 AM My Pager: [phone]

## 2017-09-26 NOTE — Discharge Summary (Signed)
Name: Jeffrey [name] MRN: [id number] DOB: Sep 25, 1955 62 y.o. PCP: Velna Ochs, MD  Date of Admission: 09/23/2017  6:39 AM Date of Discharge: 09/26/17 Attending Physician: Lucious Groves, DO  Discharge Diagnosis: 1. Acute on chronic anemia 2. End-stage renal disease on home hemodialysis 3. History of septic joint Principal Problem:   Symptomatic anemia Active Problems:   HIV disease ([place])   End stage renal disease on home HD   HBV (hepatitis B virus) infection   Essential hypertension   Aortic valve insufficiency   Secondary hyperparathyroidism ([place])   Discharge Medications: Allergies as of 09/26/2017      Reactions   Lisinopril Anaphylaxis, Shortness Of Breath   Throat swelling   Penicillins Other (See Comments)   Childhood allergy Has patient had a PCN reaction causing immediate rash, facial/tongue/throat swelling, SOB or lightheadedness with hypotension: Unk Has patient had a PCN reaction causing severe rash involving mucus membranes or skin necrosis: Unk Has patient had a PCN reaction that required hospitalization: Unk Has patient had a PCN reaction occurring within the last 10 years: No If all of the above answers are "NO", then may proceed with Cephalosporin use.      Medication List    STOP taking these medications   doxycycline 100 MG tablet Commonly known as:  VIBRA-TABS   oxyCODONE-acetaminophen 5-325 MG tablet Commonly known as:  PERCOCET/ROXICET     TAKE these medications   albuterol (2.5 MG/3ML) 0.083% nebulizer solution Commonly known as:  PROVENTIL Take 2.5 mg by nebulization every 6 (six) hours as needed for wheezing or shortness of breath.   ALPRAZolam 0.25 MG tablet Commonly known as:  XANAX Take 0.25 mg by mouth daily as needed for anxiety.   aspirin 325 MG tablet Take 1 tablet (325 mg total) by mouth 2 (two) times daily.   calcium acetate 667 MG capsule Commonly known as:  PHOSLO Take 667-2,001 mg by mouth See admin instructions.  1,334-2,001 mg three times a day with each meal and 667-1,334 mg with each snack   carvedilol 25 MG tablet Commonly known as:  COREG Take 25 mg by mouth 2 (two) times daily with a meal.   cloNIDine 0.1 mg/24hr patch Commonly known as:  CATAPRES - Dosed in mg/24 hr UNWRAP AND APPLY 1 PATCH TO SKIN EVERY 7 DAYS What changed:  See the new instructions.   clotrimazole-betamethasone cream Commonly known as:  LOTRISONE Apply 1 application topically 2 (two) times daily.   DAPTOmycin 762.5 mg in sodium chloride 0.9 % 100 mL Inject 762.5 mg into the vein every other day.   daptomycin IVPB Commonly known as:  CUBICIN Inject 760 mg into the vein every other day for 27 days. Administer dose after HD on HD days. Indication:  Septic joint Last Day of Therapy:  10/14/2017 Labs - Once weekly:  CBC/D, CRP, ESR, and CPK Fax weekly labs to [phone]   doxazosin 4 MG tablet Commonly known as:  CARDURA Take 4 mg by mouth daily.   epoetin alfa 2000 UNIT/ML injection Commonly known as:  EPOGEN,PROCRIT Inject 2,000 Units into the vein 3 (three) times a week.   ethyl chloride spray Apply 1 application topically daily as needed (HD). At HD   heparin 1000 UNIT/ML injection Inject 3,000 Units into the vein 3 (three) times a week.   hydrALAZINE 100 MG tablet Commonly known as:  APRESOLINE Take 1 tablet (100 mg total) by mouth 2 (two) times daily. What changed:  when to take this  HYDROcodone-acetaminophen 10-325 MG tablet Commonly known as:  NORCO Take 1-2 tablets by mouth every 6 (six) hours as needed for severe pain ((score 7 to 10)).   ISENTRESS 400 MG tablet Generic drug:  raltegravir TAKE 1 TABLET(400 MG) BY MOUTH TWICE DAILY   lamivudine 5 MG/ML solution Commonly known as:  EPIVIR HBV TAKE 5 ML BY MOUTH EVERY DAY What changed:    how to take this  when to take this  additional instructions   methocarbamol 500 MG tablet Commonly known as:  ROBAXIN Take 1 tablet (500 mg  total) by mouth every 8 (eight) hours as needed for muscle spasms.   multivitamin Tabs tablet Take 1 tablet by mouth daily.   PROCTOZONE-HC 2.5 % rectal cream Generic drug:  hydrocortisone Place 1 application rectally 2 (two) times daily as needed.   tenofovir 300 MG tablet Commonly known as:  VIREAD TAKE 1 TABLET BY MOUTH EVERY SATURDAY AFTER DIALYSIS   traZODone 50 MG tablet Commonly known as:  DESYREL Take 50 mg by mouth at bedtime as needed for sleep.   VOLTAREN 1 % Gel Generic drug:  diclofenac sodium APPLY 2 GRAMS EXTERNALLY TO THE AFFECTED AREA FOUR TIMES DAILY       Disposition and follow-up:   Jeffrey [name] was discharged from [facility] in stable condition.  At the hospital follow up visit please address:  1.  Anemia: Please gave repeat CBC. Please assess for other forms of blood loss. Septic knee: Please ensure compliance with daptomycin and compliance with his follow-up appointment with orthopedics.  2.  Labs / imaging needed at time of follow-up: CBC  3.  Pending labs/ test needing follow-up: Final blood cultures  Follow-up Appointments: Follow-up Information    Velna Ochs, MD. Schedule an appointment as soon as possible for a visit in 1 week(s).   Specialty:  Internal Medicine Contact information: [place] [address] [phone]        Campbell Riches, MD .   Specialty:  Infectious Diseases Contact information: [place] [address] [place] [address] [place] Hospital Course by problem list: Principal Problem:   Symptomatic anemia Active Problems:   HIV disease ([place])   End stage renal disease on home HD   HBV (hepatitis B virus) infection   Essential hypertension   Aortic valve insufficiency   Secondary hyperparathyroidism ([place])   1. Acute on Chronic Anemia Jeffrey [name] is a very pleasant 62 year old male with end-stage renal disease and subsequent chronic anemia who  was sent to the emergency department for a critical lab value noted on routine outpatient labs. Hemoglobin returned as 4.9 and was subsequently admitted for workup and transfusion. He was transfused a total of 4 units with appropriate hemoglobin response to 9. Patient denied any sort of blood loss although does note whenever he has an infectious process his hemoglobin level drops and it's not unusual for him to require transfusions during that time. He is currently being treated for a septic knee with Daptomycin however has not noted any redness, swelling or increased pain in the knee. He was kept an additional night as he was febrile to 100.5 after receiving his last unit. He remained afebrile the remainder of his hospitalization and at time of discharge felt well. Blood cultures returned negative.  2. ESRD on Home HD He received HD once while hospitalized and tolerated well. He will resume his usual  home HD sessions as scheduled at discharge.   3. Pulmonary Nodules As a part of workup for anemia patient had CT chest to monitor his pre-existing pulmonary nodules. These are stable since prior CT in July 2018 and optional noncontrast CT and 18-24 months is recommended for high risk patients.  Discharge Vitals:   BP 107/67   Pulse 75   Temp 99.5 F (37.5 C) (Oral)   Resp 18   Ht 6' (1.829 m)   Wt 202 lb 1.6 oz (91.7 kg)   SpO2 98%   BMI 27.41 kg/m   Pertinent Labs, Studies, and Procedures:  CT chest: Stable pulmonary nodules bilaterally. Largest in the left lower lobe measuring 10 mm. Transfused 4 units packed red blood cells HD 1 Blood cultures 09/25/17: No growth  Discharge Instructions: Jeffrey [name] you were admitted for treatment of anemia. You received 4 units of packed red blood cells with appropriate response. This is most likely because you are dealing with an acute infection as you mention is not unusual for your blood counts to go low during that time. Please follow-up with the  internal medicine clinic to get a repeat CBC within 1 week. Please resume HD at home per your original schedule. Please continue taking your antibiotics for your septic knee.   SignedEinar Gip, DO 09/26/2017, 12:26 PM   Pager: [phone]

## 2017-09-26 NOTE — Progress Notes (Signed)
Internal Medicine Attending:   I saw and examined the patient. I reviewed the resident's note and I agree with the resident's findings and plan as documented in the resident's note. Doing well no further fevers, no complaints ready to go home.  [place] for discharge home today, will continue IV antibiotics for his septic joint and home HD.

## 2017-09-26 NOTE — Discharge Instructions (Signed)
Jeffrey [name], it was a pleasure taking care of you during your hospital stay. You were admitted for anemia and received several blood transfusions. You were kept an additional night for monitoring as you developed a fever, which is not uncommon after transfusions.  Please follow-up with your PCP in the Internal Medicine Center and Infectious Disease in 1 week!

## 2017-09-27 DIAGNOSIS — L03116 Cellulitis of left lower limb: Secondary | ICD-10-CM | POA: Diagnosis not present

## 2017-09-27 DIAGNOSIS — N186 End stage renal disease: Secondary | ICD-10-CM | POA: Diagnosis not present

## 2017-09-27 DIAGNOSIS — B20 Human immunodeficiency virus [HIV] disease: Secondary | ICD-10-CM | POA: Diagnosis not present

## 2017-09-27 DIAGNOSIS — D696 Thrombocytopenia, unspecified: Secondary | ICD-10-CM | POA: Diagnosis not present

## 2017-09-27 DIAGNOSIS — I120 Hypertensive chronic kidney disease with stage 5 chronic kidney disease or end stage renal disease: Secondary | ICD-10-CM | POA: Diagnosis not present

## 2017-09-27 DIAGNOSIS — M00062 Staphylococcal arthritis, left knee: Secondary | ICD-10-CM | POA: Diagnosis not present

## 2017-09-27 DIAGNOSIS — D631 Anemia in chronic kidney disease: Secondary | ICD-10-CM | POA: Diagnosis not present

## 2017-09-28 DIAGNOSIS — I120 Hypertensive chronic kidney disease with stage 5 chronic kidney disease or end stage renal disease: Secondary | ICD-10-CM | POA: Diagnosis not present

## 2017-09-28 DIAGNOSIS — N186 End stage renal disease: Secondary | ICD-10-CM | POA: Diagnosis not present

## 2017-09-28 DIAGNOSIS — I509 Heart failure, unspecified: Secondary | ICD-10-CM | POA: Diagnosis not present

## 2017-09-28 DIAGNOSIS — Z992 Dependence on renal dialysis: Secondary | ICD-10-CM | POA: Diagnosis not present

## 2017-09-28 DIAGNOSIS — D631 Anemia in chronic kidney disease: Secondary | ICD-10-CM | POA: Diagnosis not present

## 2017-09-30 DIAGNOSIS — I509 Heart failure, unspecified: Secondary | ICD-10-CM | POA: Diagnosis not present

## 2017-09-30 DIAGNOSIS — Z992 Dependence on renal dialysis: Secondary | ICD-10-CM | POA: Diagnosis not present

## 2017-09-30 DIAGNOSIS — N186 End stage renal disease: Secondary | ICD-10-CM | POA: Diagnosis not present

## 2017-09-30 DIAGNOSIS — D631 Anemia in chronic kidney disease: Secondary | ICD-10-CM | POA: Diagnosis not present

## 2017-09-30 DIAGNOSIS — I120 Hypertensive chronic kidney disease with stage 5 chronic kidney disease or end stage renal disease: Secondary | ICD-10-CM | POA: Diagnosis not present

## 2017-09-30 LAB — CULTURE, BLOOD (ROUTINE X 2)
Culture: NO GROWTH
Culture: NO GROWTH
Special Requests: ADEQUATE
Special Requests: ADEQUATE

## 2017-10-01 DIAGNOSIS — D631 Anemia in chronic kidney disease: Secondary | ICD-10-CM | POA: Diagnosis not present

## 2017-10-01 DIAGNOSIS — N186 End stage renal disease: Secondary | ICD-10-CM | POA: Diagnosis not present

## 2017-10-01 DIAGNOSIS — I509 Heart failure, unspecified: Secondary | ICD-10-CM | POA: Diagnosis not present

## 2017-10-01 DIAGNOSIS — Z992 Dependence on renal dialysis: Secondary | ICD-10-CM | POA: Diagnosis not present

## 2017-10-01 DIAGNOSIS — I120 Hypertensive chronic kidney disease with stage 5 chronic kidney disease or end stage renal disease: Secondary | ICD-10-CM | POA: Diagnosis not present

## 2017-10-01 IMAGING — DX DG CHEST 2V
2 series · 2 of 2 positions shown · non-contrast
Comparison: 07/31/2014.

CLINICAL DATA: Three day history of shortness of breath.

EXAM:
CHEST  2 VIEW

[chest pa]
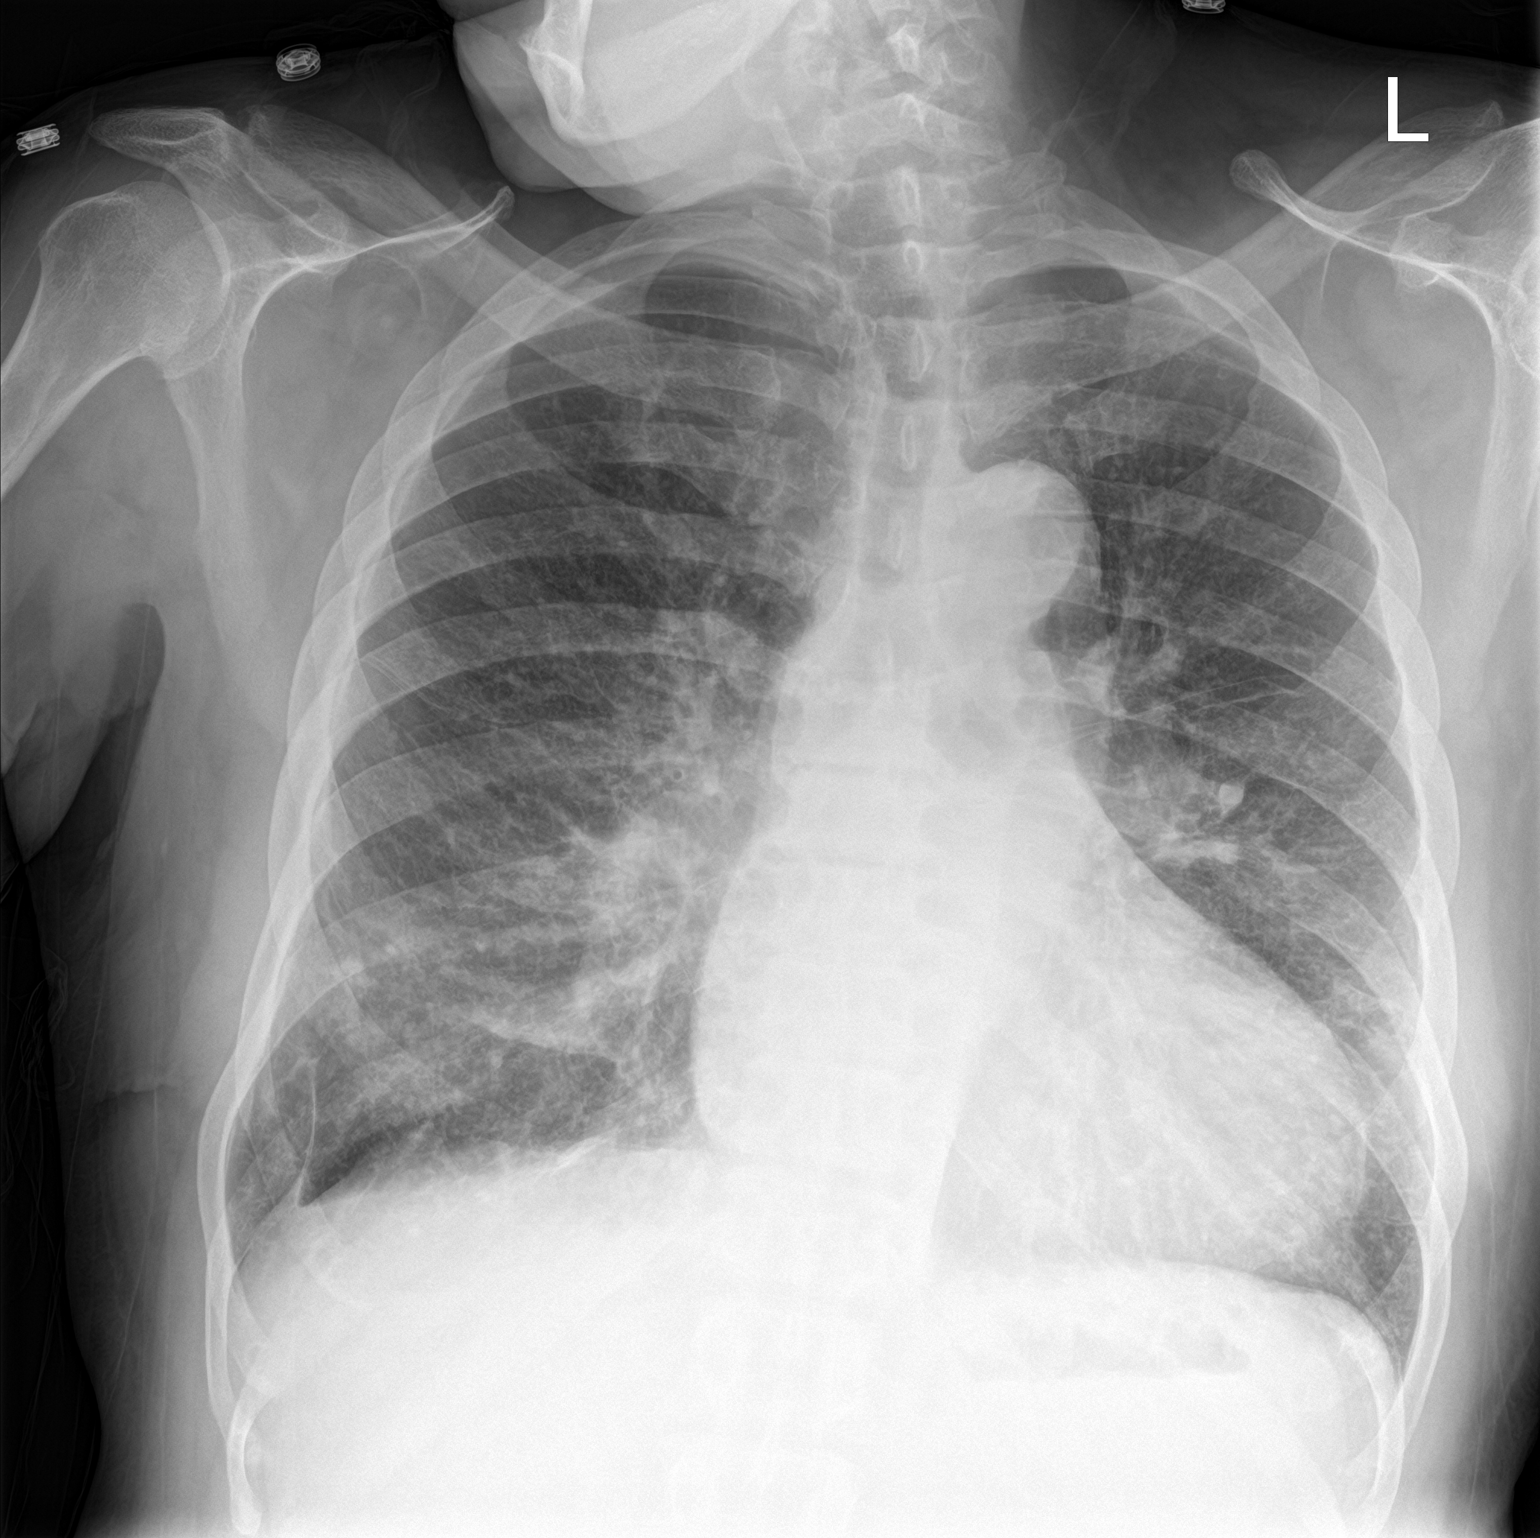

[chest lat]
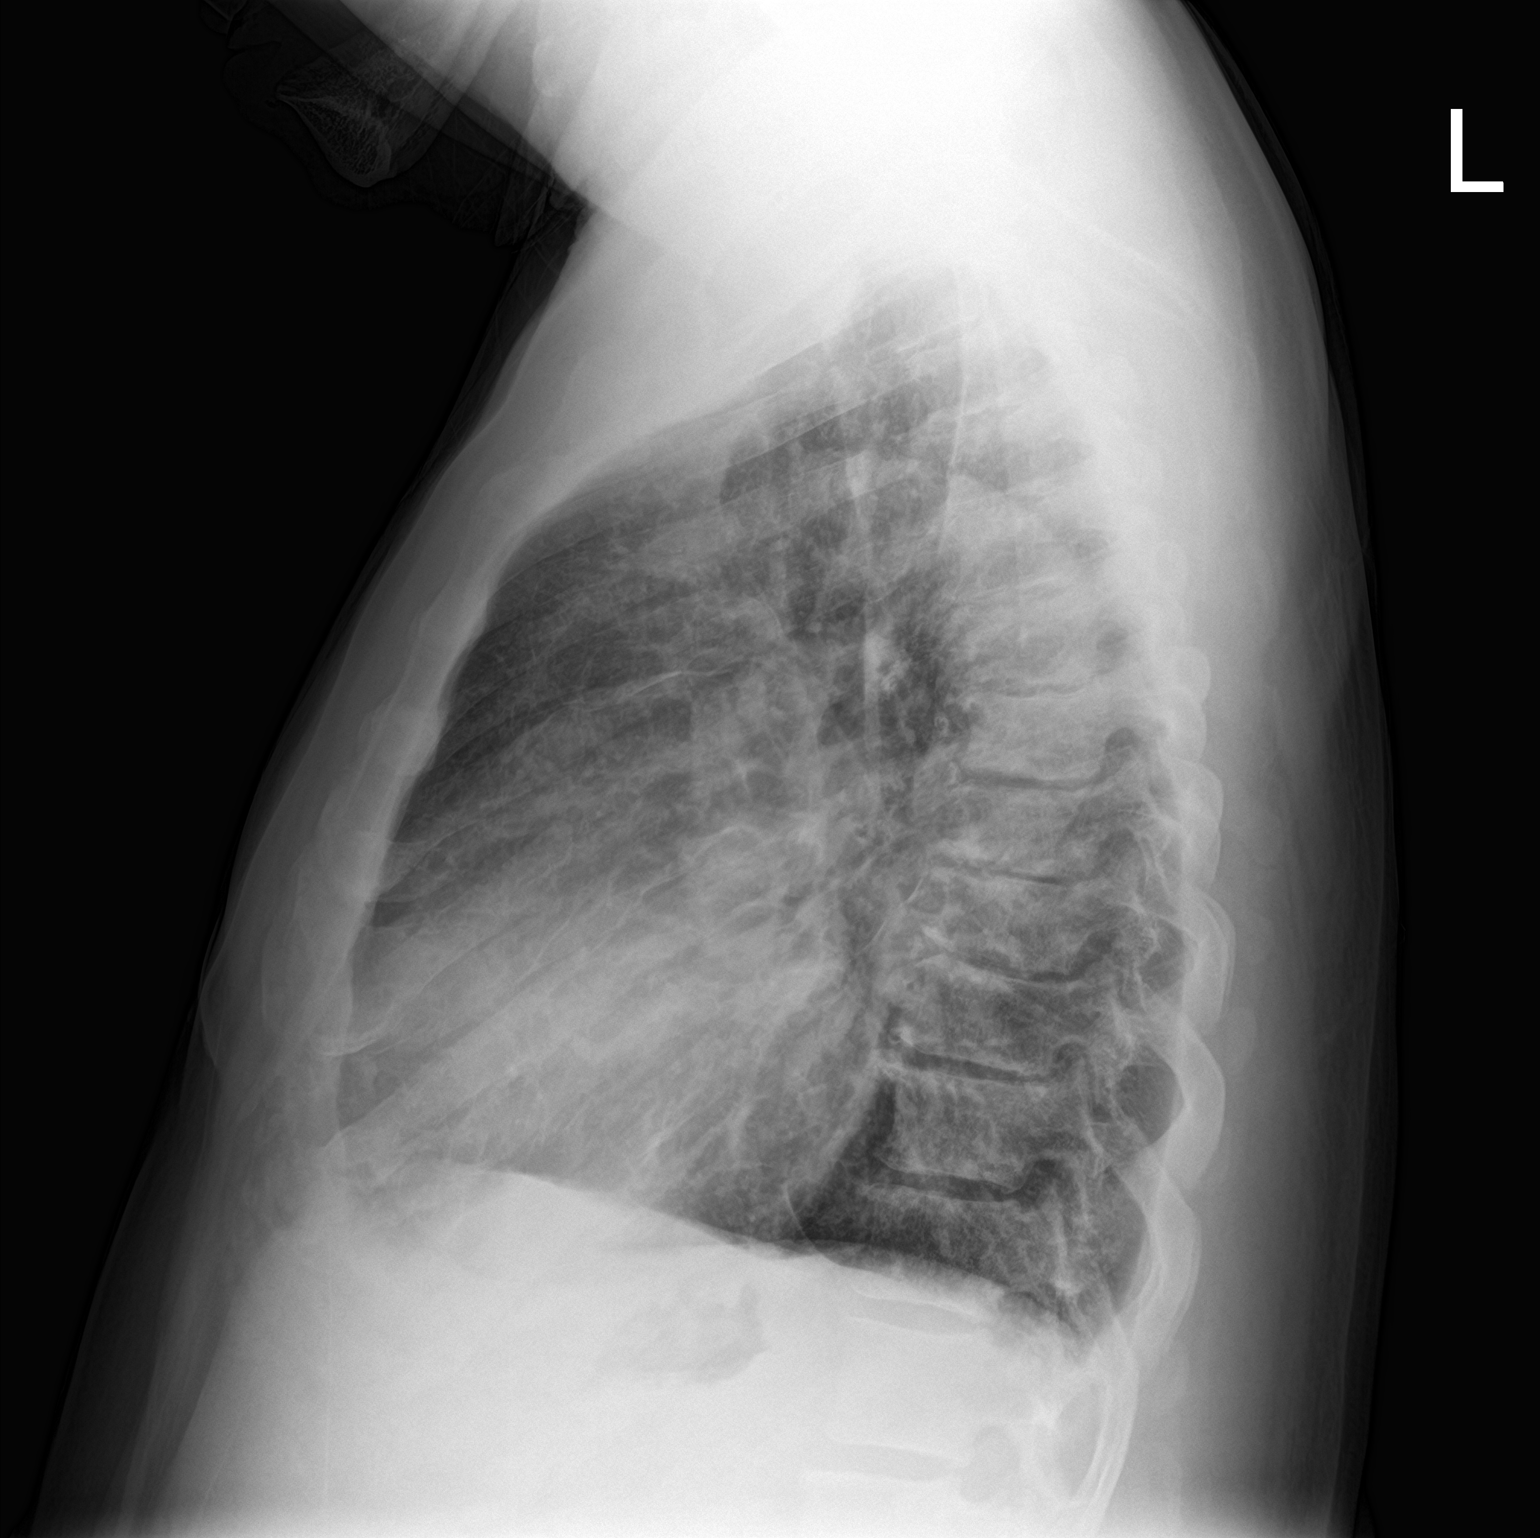

[2 of 2 positions shown; findings below may reference images not displayed]

FINDINGS: Lungs are hyperexpanded. Interstitial markings are diffusely
coarsened with chronic features. Basilar interstitial and alveolar
opacity is slightly asymmetric, right greater than left.
Cardiopericardial silhouette is at upper limits of normal for size.
The visualized bony structures of the thorax are intact.
IMPRESSION: Emphysema with chronic interstitial coarsening.

Bibasilar interstitial and alveolar opacity, right greater than
left. Imaging features may be related to atypical infection or
bronchopneumonia.

## 2017-10-04 DIAGNOSIS — M00062 Staphylococcal arthritis, left knee: Secondary | ICD-10-CM | POA: Diagnosis not present

## 2017-10-04 DIAGNOSIS — B199 Unspecified viral hepatitis without hepatic coma: Secondary | ICD-10-CM | POA: Diagnosis not present

## 2017-10-04 DIAGNOSIS — D631 Anemia in chronic kidney disease: Secondary | ICD-10-CM | POA: Diagnosis not present

## 2017-10-04 DIAGNOSIS — Z7982 Long term (current) use of aspirin: Secondary | ICD-10-CM | POA: Diagnosis not present

## 2017-10-04 DIAGNOSIS — Z79891 Long term (current) use of opiate analgesic: Secondary | ICD-10-CM | POA: Diagnosis not present

## 2017-10-04 DIAGNOSIS — Z452 Encounter for adjustment and management of vascular access device: Secondary | ICD-10-CM | POA: Diagnosis not present

## 2017-10-04 DIAGNOSIS — Z7951 Long term (current) use of inhaled steroids: Secondary | ICD-10-CM | POA: Diagnosis not present

## 2017-10-04 DIAGNOSIS — B20 Human immunodeficiency virus [HIV] disease: Secondary | ICD-10-CM | POA: Diagnosis not present

## 2017-10-04 DIAGNOSIS — M1380 Other specified arthritis, unspecified site: Secondary | ICD-10-CM | POA: Insufficient documentation

## 2017-10-04 DIAGNOSIS — F419 Anxiety disorder, unspecified: Secondary | ICD-10-CM | POA: Diagnosis not present

## 2017-10-04 DIAGNOSIS — I120 Hypertensive chronic kidney disease with stage 5 chronic kidney disease or end stage renal disease: Secondary | ICD-10-CM | POA: Diagnosis not present

## 2017-10-04 DIAGNOSIS — M009 Pyogenic arthritis, unspecified: Secondary | ICD-10-CM | POA: Insufficient documentation

## 2017-10-04 DIAGNOSIS — I509 Heart failure, unspecified: Secondary | ICD-10-CM | POA: Diagnosis not present

## 2017-10-04 DIAGNOSIS — D696 Thrombocytopenia, unspecified: Secondary | ICD-10-CM | POA: Diagnosis not present

## 2017-10-04 DIAGNOSIS — N186 End stage renal disease: Secondary | ICD-10-CM | POA: Diagnosis not present

## 2017-10-04 DIAGNOSIS — Z992 Dependence on renal dialysis: Secondary | ICD-10-CM | POA: Diagnosis not present

## 2017-10-05 ENCOUNTER — Other Ambulatory Visit: Payer: Self-pay | Admitting: Infectious Diseases

## 2017-10-05 ENCOUNTER — Other Ambulatory Visit: Payer: Self-pay

## 2017-10-05 ENCOUNTER — Ambulatory Visit (INDEPENDENT_AMBULATORY_CARE_PROVIDER_SITE_OTHER): Payer: Medicare Other | Admitting: Internal Medicine

## 2017-10-05 ENCOUNTER — Encounter: Payer: Self-pay | Admitting: Internal Medicine

## 2017-10-05 VITALS — BP 192/85 | HR 76 | Temp 98.2°F | Ht 72.0 in | Wt 198.7 lb

## 2017-10-05 DIAGNOSIS — D649 Anemia, unspecified: Secondary | ICD-10-CM | POA: Diagnosis not present

## 2017-10-05 DIAGNOSIS — Z87891 Personal history of nicotine dependence: Secondary | ICD-10-CM | POA: Diagnosis not present

## 2017-10-05 DIAGNOSIS — I10 Essential (primary) hypertension: Secondary | ICD-10-CM

## 2017-10-05 DIAGNOSIS — I120 Hypertensive chronic kidney disease with stage 5 chronic kidney disease or end stage renal disease: Secondary | ICD-10-CM | POA: Diagnosis present

## 2017-10-05 DIAGNOSIS — Z79899 Other long term (current) drug therapy: Secondary | ICD-10-CM

## 2017-10-05 DIAGNOSIS — Z9889 Other specified postprocedural states: Secondary | ICD-10-CM | POA: Diagnosis not present

## 2017-10-05 DIAGNOSIS — N186 End stage renal disease: Secondary | ICD-10-CM | POA: Diagnosis not present

## 2017-10-05 DIAGNOSIS — Z21 Asymptomatic human immunodeficiency virus [HIV] infection status: Secondary | ICD-10-CM | POA: Diagnosis not present

## 2017-10-05 DIAGNOSIS — M009 Pyogenic arthritis, unspecified: Secondary | ICD-10-CM

## 2017-10-05 DIAGNOSIS — Z7982 Long term (current) use of aspirin: Secondary | ICD-10-CM | POA: Diagnosis not present

## 2017-10-05 DIAGNOSIS — I509 Heart failure, unspecified: Secondary | ICD-10-CM | POA: Diagnosis not present

## 2017-10-05 DIAGNOSIS — Z992 Dependence on renal dialysis: Secondary | ICD-10-CM | POA: Diagnosis not present

## 2017-10-05 DIAGNOSIS — D631 Anemia in chronic kidney disease: Secondary | ICD-10-CM

## 2017-10-05 DIAGNOSIS — B20 Human immunodeficiency virus [HIV] disease: Secondary | ICD-10-CM

## 2017-10-05 DIAGNOSIS — B181 Chronic viral hepatitis B without delta-agent: Secondary | ICD-10-CM

## 2017-10-05 DIAGNOSIS — M00062 Staphylococcal arthritis, left knee: Secondary | ICD-10-CM

## 2017-10-05 MED ORDER — CARVEDILOL 25 MG PO TABS: 25.0000 mg | ORAL_TABLET | Freq: Two times a day (BID) | ORAL | 5 refills | Status: DC

## 2017-10-05 MED ORDER — TENOFOVIR DISOPROXIL FUMARATE 300 MG PO TABS: ORAL_TABLET | ORAL | 5 refills | Status: DC

## 2017-10-05 NOTE — Patient Instructions (Signed)
I refilled your coreg to your pharmacy. Check you blood pressures at home over the next couple of days and call the clinic on around Thursday to let us know your blood pressure readings. If they are still elevated we may have you come back in for medication adjustment.   We will check in with [place] to make sure we get your blood test results and will call you with your blood level.  If you start having dizziness, chest pain, shortness of breath, headaches, vision changes, please come back in to the clinic for re-evaluation.

## 2017-10-05 NOTE — Progress Notes (Deleted)
   CC: ***  HPI:  Mr.Degan Jerilynn Mages Wenig is a 62 y.o. with a PMH of ***  Please see problem based Assessment and Plan for status of patients chronic conditions.  Past Medical History:  Diagnosis Date  . Anemia   . Anxiety   . Arthritis   . Asthma    per pt hx  . End stage renal disease on home HD 07/10/2011   Started HD in September 2012 at [facility] with a tunneled HD catheter, now on home HD with NxtStage. Dialyzing through AVF L lower arm with buttonhole technique as of mid 2014. His brother does the HD treatments at home.  They are roommates for 23 years.  The brother works 3rd shift and gets off about 8am and then puts Mr Earnshaw on HD in the morning after getting home. Most of the time he does HD about 4 times a week, for about 4 hours per treatment. Cause of ESRD was HTN according to patient. He says he let his health go and ending up with complications, and that he didn't like seeing doctors in those days.  He says he was diagnosed with severe HTN when he lived in [place] in his 34's.   . Hepatitis B carrier ([place])   . HIV infection ([place])   . Hypertension   . Hyperthyroidism    normal now  . Pneumonia several yrs ago  . Seizure ([place])   . Seizures ([place]) 06/02/2011   x 1 none since  . Thrombocytopenia ([place])     Review of Systems:   ROS  Physical Exam:  Vitals:   10/05/17 1114  BP: (!) 192/85  Pulse: 76  Temp: 98.2 F (36.8 C)  TempSrc: Oral  SpO2: 100%  Weight: 198 lb 11.2 oz (90.1 kg)  Height: 6' (1.829 m)   GENERAL- alert, co-operative, appears as stated age, not in any distress. HEENT- EOMI, oral mucosa appears moist CARDIAC- RRR, no murmurs, rubs or gallops. RESP- Moving equal volumes of air, and clear to auscultation bilaterally, no wheezes or crackles. ABDOMEN- Soft, nontender, bowel sounds present. NEURO- No obvious Cr N abnormality. EXTREMITIES- no pedal edema, mild L knee swelling with nontender, non draining incision site  SKIN- Warm, dry, No rash or  lesion. PSYCH- Normal mood and affect, appropriate thought content and speech.  Assessment & Plan:   See Encounters Tab for problem based charting.   Patient discussed with Dr. Angelia Mould   Alphonzo Grieve, MD Internal Medicine PGY2

## 2017-10-05 NOTE — Progress Notes (Signed)
CC: anemia  HPI:  [name] is a 62 y.o. with a PMH of ESRD on home HD, HIV, chronic HBV, h/o right knee septic joint and acute on chronic anemia presenting to clinic for hospital follow up for acute on chronic anemia and for BP follow up.  Acute on chronic anemia: Patient hospitalized recently for mildly symptomatic (fatigue) acute on chronic anemia with hgb of 4.9 when checked by [facility] (for monitoring of his Abx regimen for septic joint). Patient received a total of 4 units of pRBCs and Hgb on discharge was 9.0 (though it appears artificially elevated as one prior to that was 7.6). Today patient states he feels well, denies chest pain, shortness of breath, dizziness, headache, weakness. He does not produce urine; he denies melena or hematochezia, denies easy bruising. HH drew his labs yesterday including a CBC but he has not heard back about the results.   HTN: Patient with HTN and ESRD on 4x weekly home HD; medications include hydralazine 100mg  BID, doxizocin 4mg  daily, coreg 25mg  BID, and clonidine 0.1mg  patch weekly. He states he has been out of coreg, but endorses compliance with all the other medications. He states BP has been about his baseline at home in 140s to 150s. He denies chest pain, shortness of breath, LE edema, headaches, vision or hearing changes, focal weakness/numbness, dysarthria or dysphagia.   Please see problem based Assessment and Plan for status of patients chronic conditions.  Past Medical History:  Diagnosis Date  . Anemia   . Anxiety   . Arthritis   . Asthma    per pt hx  . End stage renal disease on home HD 07/10/2011   Started HD in September 2012 at [facility] with a tunneled HD catheter, now on home HD with NxtStage. Dialyzing through AVF L lower arm with buttonhole technique as of mid 2014. His brother does the HD treatments at home.  They are roommates for 23 years.  The brother works 3rd shift and gets off about 8am and then puts Mr Jeffrey [name] on HD in the  morning after getting home. Most of the time he does HD about 4 times a week, for about 4 hours per treatment. Cause of ESRD was HTN according to patient. He says he let his health go and ending up with complications, and that he didn't like seeing doctors in those days.  He says he was diagnosed with severe HTN when he lived in [place] in his 72's.   . Hepatitis B carrier ([place])   . HIV infection ([place])   . Hypertension   . Hyperthyroidism    normal now  . Pneumonia several yrs ago  . Seizure ([place])   . Seizures ([place]) 06/02/2011   x 1 none since  . Thrombocytopenia ([place])     Review of Systems:   ROS Per HPI.  Physical Exam:  Vitals:   10/05/17 1114  BP: (!) 192/85  Pulse: 76  Temp: 98.2 F (36.8 C)  TempSrc: Oral  SpO2: 100%  Weight: 198 lb 11.2 oz (90.1 kg)  Height: 6' (1.829 m)   GENERAL- alert, co-operative, appears as stated age, not in any distress. HEENT- Atraumatic, normocephalic, PERRL, EOMI, oral mucosa appears moist CARDIAC- RRR, no murmurs, rubs or gallops. RESP- Moving equal volumes of air, and clear to auscultation bilaterally, no wheezes or crackles. ABDOMEN- Soft, nontender, bowel sounds present. NEURO- CN 2-12 intact. EXTREMITIES- pulse 2+ PT, symmetric, no pedal edema. L knee with minimal swelling, no increased  warmth; sites of previous incisions clean and dry, nontender. PSYCH- Normal mood and affect, appropriate thought content and speech.  Assessment & Plan:   See Encounters Tab for problem based charting.   Patient discussed with Dr. Angelia Mould   Alphonzo Grieve, MD Internal Medicine PGY2

## 2017-10-06 ENCOUNTER — Encounter: Payer: Self-pay | Admitting: Internal Medicine

## 2017-10-06 NOTE — Assessment & Plan Note (Signed)
Patient hospitalized recently for mildly symptomatic (fatigue) acute on chronic anemia with hgb of 4.9 when checked by [facility] (for monitoring of his Abx regimen for septic joint). Patient received a total of 4 units of pRBCs and Hgb on discharge was 9.0 (though it appears artificially elevated as one prior to that was 7.6). Today patient states he feels well, denies chest pain, shortness of breath, dizziness, headache, weakness. He does not produce urine; he denies melena or hematochezia, denies easy bruising. HH drew his labs yesterday including a CBC but he has not heard back about the results.  Plan: --[facility] agency faxed over results to our office; Hgb 1/14 is 7.4. As patient is asymptomatic and last inpt value likely showed an overcorrected value, will continue monitoring for now.  --patient is scheduled to have repeat labs drawn with [facility] in 1 wk --advised to return to clinic or go to ED if symptoms arise

## 2017-10-06 NOTE — Assessment & Plan Note (Signed)
Patient continues to receive daptomycin with HD per ID. Joint appears to be healing well; was re-evaluated by ortho last week and stitches removed.  Pt to f/u with ID in a couple of weeks.

## 2017-10-06 NOTE — Assessment & Plan Note (Addendum)
Uncontrolled, off of coreg. It also appears that his amlodipine was taken off his medlist during an ED visit in Dec 2018. Patient is asymptomatic and appears euvolemic on exam today.  Plan: --refilled coreg 25mg  BID --continue hydralazine 100mg  BID, doxizocin 4mg  daily, and clonidine 0.1mg  weekly patch --patient to call in a couple of days to report his BP readings at home; if still consistently elevated despite coreg, will add back amlodipine

## 2017-10-06 NOTE — Discharge Summary (Signed)
Physician Discharge Summary   Patient ID: Jeffrey [name] MRN: [id number] DOB/AGE: 1955-11-02 62 y.o.  Admit date: 09/15/2017 Discharge date: 09/17/2017  Primary Diagnosis: Left knee, septic arthritis    Admission Diagnoses:  Past Medical History:  Diagnosis Date  . Anemia   . Anxiety   . Arthritis   . Asthma    per pt hx  . End stage renal disease on home HD 07/10/2011   Started HD in September 2012 at [facility] with a tunneled HD catheter, now on home HD with [facility]. Dialyzing through AVF L lower arm with buttonhole technique as of mid 2014. His brother does the HD treatments at home.  They are roommates for 23 years.  The brother works 3rd shift and gets off about 8am and then puts Mr Kirksey on HD in the morning after getting home. Most of the time he does HD about 4 times a week, for about 4 hours per treatment. Cause of ESRD was HTN according to patient. He says he let his health go and ending up with complications, and that he didn't like seeing doctors in those days.  He says he was diagnosed with severe HTN when he lived in [place] in his 31's.   . Hepatitis B carrier ([place])   . HIV infection ([place])   . Hypertension   . Hyperthyroidism    normal now  . Pneumonia several yrs ago  . Seizure ([place])   . Seizures ([place]) 06/02/2011   x 1 none since  . Thrombocytopenia ([place])    Discharge Diagnoses:   Active Problems:   HIV disease ([place])   Septic arthritis ([place])   Antibiotic long-term use   ESRD on hemodialysis ([place])  Estimated body mass index is 28.48 kg/m as calculated from the following:   Height as of this encounter: 6' (1.829 m).   Weight as of this encounter: 95.3 kg (210 lb).  Procedure:  Procedure(s) (LRB): IRRIGATION AND DEBRIDEMENT KNEE; arthroscopic clean out (Left)   Consults: ID  HPI: Abdirahim presented with the chief complaint of left knee pain. He has developed left knee pain as well as an effusion. Aspiration and subsequent study of the aspirate revealed an  infection in the left knee. He was started on oral antibiotics but did not respond adequately. He was scheduled for left knee arthroscopic irrigation and debridement.   Laboratory Data: Admission on 09/15/2017, Discharged on 09/17/2017  Component Date Value Ref Range Status  . WBC 09/15/2017 9.7  4.0 - 10.5 K/uL Final  . RBC 09/15/2017 2.10* 4.22 - 5.81 MIL/uL Final  . Hemoglobin 09/15/2017 7.1* 13.0 - 17.0 g/dL Final  . HCT 09/15/2017 21.0* 39.0 - 52.0 % Final  . MCV 09/15/2017 100.0  78.0 - 100.0 fL Final  . MCH 09/15/2017 33.8  26.0 - 34.0 pg Final  . MCHC 09/15/2017 33.8  30.0 - 36.0 g/dL Final  . RDW 09/15/2017 14.0  11.5 - 15.5 % Final  . Platelets 09/15/2017 184  150 - 400 K/uL Final  . Neutrophils Relative % 09/15/2017 78  % Final  . Neutro Abs 09/15/2017 7.6  1.7 - 7.7 K/uL Final  . Lymphocytes Relative 09/15/2017 12  % Final  . Lymphs Abs 09/15/2017 1.1  0.7 - 4.0 K/uL Final  . Monocytes Relative 09/15/2017 7  % Final  . Monocytes Absolute 09/15/2017 0.7  0.1 - 1.0 K/uL Final  . Eosinophils Relative 09/15/2017 3  % Final  . Eosinophils Absolute 09/15/2017 0.3  0.0 - 0.7 K/uL  Final  . Basophils Relative 09/15/2017 0  % Final  . Basophils Absolute 09/15/2017 0.0  0.0 - 0.1 K/uL Final  . Sodium 09/15/2017 139  135 - 145 mmol/L Final  . Potassium 09/15/2017 4.9  3.5 - 5.1 mmol/L Final  . Chloride 09/15/2017 101  101 - 111 mmol/L Final  . CO2 09/15/2017 22  22 - 32 mmol/L Final  . Glucose, Bld 09/15/2017 98  65 - 99 mg/dL Final  . BUN 09/15/2017 88* 6 - 20 mg/dL Final  . Creatinine, Ser 09/15/2017 14.21* 0.61 - 1.24 mg/dL Final  . Calcium 09/15/2017 8.3* 8.9 - 10.3 mg/dL Final  . Total Protein 09/15/2017 6.8  6.5 - 8.1 g/dL Final  . Albumin 09/15/2017 3.1* 3.5 - 5.0 g/dL Final  . AST 09/15/2017 14* 15 - 41 U/L Final  . ALT 09/15/2017 7* 17 - 63 U/L Final  . Alkaline Phosphatase 09/15/2017 54  38 - 126 U/L Final  . Total Bilirubin 09/15/2017 0.7  0.3 - 1.2 mg/dL Final  . GFR  calc non Af Amer 09/15/2017 3* >60 mL/min Final  . GFR calc Af Amer 09/15/2017 4* >60 mL/min Final   Comment: (NOTE) The eGFR has been calculated using the CKD EPI equation. This calculation has not been validated in all clinical situations. eGFR's persistently <60 mL/min signify possible Chronic Kidney Disease.   . Anion gap 09/15/2017 16* 5 - 15 Final  . Prothrombin Time 09/15/2017 14.3  11.4 - 15.2 seconds Final  . INR 09/15/2017 1.12   Final  . aPTT 09/15/2017 37* 24 - 36 seconds Final   Comment:        IF BASELINE aPTT IS ELEVATED, SUGGEST PATIENT RISK ASSESSMENT BE USED TO DETERMINE APPROPRIATE ANTICOAGULANT THERAPY.   Marland Kitchen Specimen Description 09/15/2017 SYNOVIAL KNEE LEFT FLUID   Final  . Special Requests 09/15/2017 NONE   Final  . Gram Stain 09/15/2017    Final                   Value:ABUNDANT WBC PRESENT, PREDOMINANTLY PMN NO ORGANISMS SEEN Gram Stain Report Called to,Read Back By and Verified With: MICKLEY,H @ [id number] ON [id number] BY POTEAT,S   . Culture 09/15/2017    Final                   Value:NO GROWTH 3 DAYS Performed at [facility], [place] [address], [place], [place] [address]   . Report Status 09/15/2017 09/19/2017 FINAL   Final  . Specimen Description 09/15/2017 SYNOVIAL KNEE LEFT FLUID   Final  . Special Requests 09/15/2017 NONE   Final  . Culture 09/15/2017    Final                   Value:NO ANAEROBES ISOLATED Performed at [facility], [place] [address] [place], [place] [address]   . Report Status 09/15/2017 09/20/2017 FINAL   Final  . Sodium 09/16/2017 132* 135 - 145 mmol/L Final   DELTA CHECK NOTED  . Potassium 09/16/2017 5.4* 3.5 - 5.1 mmol/L Final  . Chloride 09/16/2017 97* 101 - 111 mmol/L Final  . CO2 09/16/2017 21* 22 - 32 mmol/L Final  . Glucose, Bld 09/16/2017 122* 65 - 99 mg/dL Final  . BUN 09/16/2017 96* 6 - 20 mg/dL Final  . Creatinine, Ser 09/16/2017 15.46* 0.61 - 1.24 mg/dL Final  . Calcium 09/16/2017 8.0* 8.9 - 10.3 mg/dL Final  .  GFR calc non Af Amer 09/16/2017 3* >60  mL/min Final  . GFR calc Af Amer 09/16/2017 3* >60 mL/min Final   Comment: (NOTE) The eGFR has been calculated using the CKD EPI equation. This calculation has not been validated in all clinical situations. eGFR's persistently <60 mL/min signify possible Chronic Kidney Disease.   . Anion gap 09/16/2017 14  5 - 15 Final  . Prothrombin Time 09/16/2017 15.0  11.4 - 15.2 seconds Final  . INR 09/16/2017 1.19   Final  . Total CK 09/16/2017 18* 49 - 397 U/L Final  . Specimen Description 09/16/2017 BLOOD RIGHT ANTECUBITAL   Final  . Special Requests 09/16/2017 BOTTLES DRAWN AEROBIC AND ANAEROBIC Blood Culture adequate volume   Final  . Culture 09/16/2017    Final                   Value:NO GROWTH 5 DAYS Performed at [facility], [place] [address] [place], [place] [address]   . Report Status 09/16/2017 09/21/2017 FINAL   Final  . Specimen Description 09/16/2017 BLOOD RIGHT FOREARM   Final  . Special Requests 09/16/2017 BOTTLES DRAWN AEROBIC AND ANAEROBIC Blood Culture results may not be optimal due to an inadequate volume of blood received in culture bottles   Final  . Culture 09/16/2017    Final                   Value:NO GROWTH 5 DAYS Performed at [facility], [place] [address], [place], [place] [address]   . Report Status 09/16/2017 09/21/2017 FINAL   Final  . Sodium 09/17/2017 136  135 - 145 mmol/L Final  . Potassium 09/17/2017 4.8  3.5 - 5.1 mmol/L Final  . Chloride 09/17/2017 98* 101 - 111 mmol/L Final  . CO2 09/17/2017 22  22 - 32 mmol/L Final  . Glucose, Bld 09/17/2017 111* 65 - 99 mg/dL Final  . BUN 09/17/2017 103* 6 - 20 mg/dL Final   RESULTS CONFIRMED BY MANUAL DILUTION  . Creatinine, Ser 09/17/2017 17.02* 0.61 - 1.24 mg/dL Final  . Calcium 09/17/2017 7.9* 8.9 - 10.3 mg/dL Final  . GFR calc non Af Amer 09/17/2017 3* >60 mL/min Final  . GFR calc Af Amer 09/17/2017 3* >60 mL/min Final   Comment: (NOTE) The eGFR has been calculated  using the CKD EPI equation. This calculation has not been validated in all clinical situations. eGFR's persistently <60 mL/min signify possible Chronic Kidney Disease.   . Anion gap 09/17/2017 16* 5 - 15 Final  . Prothrombin Time 09/17/2017 16.1* 11.4 - 15.2 seconds Final  . INR 09/17/2017 1.30   Final  Admission on 08/30/2017, Discharged on 08/30/2017  Component Date Value Ref Range Status  . Specimen Description 08/30/2017 FLUID SYNOVIAL KNEE LEFT   Final  . Special Requests 08/30/2017 Immunocompromised   Final  . Gram Stain 08/30/2017    Final                   Value:FEW WBC PRESENT, PREDOMINANTLY PMN NO ORGANISMS SEEN   . Culture 08/30/2017    Final                   Value:RARE STAPHYLOCOCCUS SPECIES (COAGULASE NEGATIVE) CRITICAL RESULT CALLED TO, READ BACK BY AND VERIFIED WITH: M SIMMS,RN AT 1358 09/03/17 BY L BENFIELD CONCERNING GROWTH ON CULTURE   . Report Status 08/30/2017 09/03/2017 FINAL   Final  . Organism ID, Bacteria 08/30/2017 STAPHYLOCOCCUS SPECIES (COAGULASE NEGATIVE)   Final  . Glucose, Body Fluid Other  08/30/2017 23  mg/dL Final   Comment: (NOTE) ________________________________________________________ :  Peritoneal  :       Pleural          :   Synovial     : :______________:________________________:________________: :              : Transudate :  Exudate  :                : :______________:____________:___________:________________: :  Not Estab.  :  Equal to simultaneously drawn plasma   : :______________:_________________________________________: The method performance specifications have not been established for this test in body fluid. The test result should be integrated into clinical context for interpretation. The reference intervals and other method performance specifications have not been established for this test. The test result should be integrated into the clinical context for interpretation. Performed At: [facility] [place], [place] [id number] Rush Farmer MD [id number]   . Source of Sample 08/30/2017 SYNOVIAL LEFT KNEE   Final  . Total Protein, Body Fluid Other 08/30/2017 4.2  g/dL Final   Comment: (NOTE) ________________________________________________________ :  Peritoneal  :       Pleural          :   Synovial     : :______________:________________________:________________: :              : Transudate :  Exudate  :                : :______________:____________:___________:________________: :  Not Estab.  :   <3 g/dL  :  >3 g/dL  :    <2.5 g/dL   : :______________:____________:___________:________________: The method performance specifications have not been established for this test in body fluid. The test result should be integrated into the clinical context for interpretation. The method performance specifications have not been established for this test in body fluid.  The test result should be integrated into the clinical context for interpretation. Performed At: [facility] [place], [place] [id number] Rush Farmer MD SK:[id number]   . Source of Sample 08/30/2017 SYNOVIAL LEFT KNEE   Final  . Color, Synovial 08/30/2017 YELLOW  YELLOW Final  . Appearance-Synovial 08/30/2017 TURBID* CLEAR Final  . Crystals, Fluid 08/30/2017 NO CRYSTALS SEEN   Final  . WBC, Synovial 08/30/2017 15,000* 0 - 200 /cu mm Final  . Neutrophil, Synovial 08/30/2017 91* 0 - 25 % Final  . Lymphocytes-Synovial Fld 08/30/2017 1  0 - 20 % Final  . Monocyte-Macrophage-Synovial Fluid 08/30/2017 8* 50 - 90 % Final  . Eosinophils-Synovial 08/30/2017 0  0 - 1 % Final     X-Rays:Ct Chest Wo Contrast  Result Date: 09/24/2017 CLINICAL DATA:  History of end-stage renal disease on home dialysis, HIV, hepatitis-B, chronic anemia, pulmonary nodule. Follow-up for lung nodule. EXAM: CT CHEST WITHOUT CONTRAST TECHNIQUE: Multidetector CT imaging of the chest was performed following the standard protocol  without IV contrast. COMPARISON:  Chest CT dated 03/30/2017. FINDINGS: Cardiovascular: Cardiomegaly. No pericardial effusion. Coronary artery calcifications. Mild aortic atherosclerosis. No aortic aneurysm. Mediastinum/Nodes: No mass or enlarged lymph nodes seen within the mediastinum or perihilar regions. Esophagus appears normal. Trachea and central bronchi are unremarkable. Lungs/Pleura: Emphysema appears stable, mild to moderate in degree. 10 mm pulmonary nodule within the left lower lobe is stable (series 4, image 121). 7 mm pulmonary nodule within the right lower lobe is stable (series 4, image 126). 4 mm pleural based pulmonary nodule within the lingula  is stable (series 4, image 35). No new pulmonary nodules or masses identified.  No pleural effusion. Upper Abdomen: No acute findings. Atrophic kidneys are incompletely imaged. Musculoskeletal: Mild degenerative change within the lower thoracic spine. No acute or suspicious osseous finding. IMPRESSION: 1. Stable pulmonary nodules bilaterally. Largest pulmonary nodule is again seen within the left lower lobe, measuring 10 mm. Non-contrast chest CT at 18-24 months (from today's scan) is considered optional for low-risk patients, but is recommended for high-risk patients. This recommendation follows the consensus statement: Guidelines for Management of Incidental Pulmonary Nodules Detected on CT Images: From the Fleischner Society 2017; Radiology 2017; 284:228-243. 2. No acute findings. 3. Cardiomegaly. Aortic Atherosclerosis (ICD10-I70.0) and Emphysema (ICD10-J43.9). Electronically Signed   By: Franki Cabot M.D.   On: 09/24/2017 17:00   Ir Fluoro Guide Cv Line Right  Result Date: 09/16/2017 INDICATION: Poor venous access. In need of durable intravenous access for antibiotic administration. History of end-stage renal disease, currently on dialysis. As such, request made for placement of a tunneled single-lumen PICC line. EXAM: TUNNELED PICC LINE WITH  ULTRASOUND AND FLUOROSCOPIC GUIDANCE MEDICATIONS: Patient is currently admitted to the hospital and receiving intravenous antibiotics. The antibiotic was given in an appropriate time interval prior to skin puncture. ANESTHESIA/SEDATION: None FLUOROSCOPY TIME:  18 seconds (12 mGy) COMPLICATIONS: None immediate. PROCEDURE: Informed written consent was obtained from the patient after a discussion of the risks, benefits, and alternatives to treatment. Questions regarding the procedure were encouraged and answered. The right neck and chest were prepped with chlorhexidine in a sterile fashion, and a sterile drape was applied covering the operative field. Maximum barrier sterile technique with sterile gowns and gloves were used for the procedure. A timeout was performed prior to the initiation of the procedure. After the overlying soft tissues were anesthetized, a small venotomy incision was created and a micropuncture kit was utilized to access the internal jugular vein. Real-time ultrasound guidance was utilized for vascular access including the acquisition of a permanent ultrasound image documenting patency of the accessed vessel. The microwire was utilized to measure appropriate catheter length. The micropuncture sheath was exchanged for a peel-away sheath over a guidewire. A 5 French single lumen tunneled PICC measuring 25 cm was tunneled in a retrograde fashion from the anterior chest wall to the venotomy incision. The catheter was then placed through the peel-away sheath with tip ultimately positioned at the superior caval-atrial junction. Final catheter positioning was confirmed and documented with a spot radiographic image. The catheter aspirates and flushes normally. The catheter was flushed with appropriate volume heparin dwells. The catheter exit site was secured with a 0-Prolene retention suture. The venotomy incision was closed with Dermabond and Steri-strips. Dressings were applied. The patient tolerated the  procedure well without immediate post procedural complication. FINDINGS: After catheter placement, the tip lies within the superior cavoatrial junction. The catheter aspirates and flushes normally and is ready for immediate use. IMPRESSION: Successful placement of 25 cm single lumen tunneled PICC catheter via the right internal jugular vein with tip terminating at the superior caval atrial junction. The catheter is ready for immediate use. Electronically Signed   By: Sandi Mariscal M.D.   On: 09/16/2017 17:32   Ir US Guide Vasc Access Right  Result Date: 09/16/2017 INDICATION: Poor venous access. In need of durable intravenous access for antibiotic administration. History of end-stage renal disease, currently on dialysis. As such, request made for placement of a tunneled single-lumen PICC line. EXAM: TUNNELED PICC LINE WITH ULTRASOUND AND FLUOROSCOPIC GUIDANCE  MEDICATIONS: Patient is currently admitted to the hospital and receiving intravenous antibiotics. The antibiotic was given in an appropriate time interval prior to skin puncture. ANESTHESIA/SEDATION: None FLUOROSCOPY TIME:  18 seconds (12 mGy) COMPLICATIONS: None immediate. PROCEDURE: Informed written consent was obtained from the patient after a discussion of the risks, benefits, and alternatives to treatment. Questions regarding the procedure were encouraged and answered. The right neck and chest were prepped with chlorhexidine in a sterile fashion, and a sterile drape was applied covering the operative field. Maximum barrier sterile technique with sterile gowns and gloves were used for the procedure. A timeout was performed prior to the initiation of the procedure. After the overlying soft tissues were anesthetized, a small venotomy incision was created and a micropuncture kit was utilized to access the internal jugular vein. Real-time ultrasound guidance was utilized for vascular access including the acquisition of a permanent ultrasound image  documenting patency of the accessed vessel. The microwire was utilized to measure appropriate catheter length. The micropuncture sheath was exchanged for a peel-away sheath over a guidewire. A 5 French single lumen tunneled PICC measuring 25 cm was tunneled in a retrograde fashion from the anterior chest wall to the venotomy incision. The catheter was then placed through the peel-away sheath with tip ultimately positioned at the superior caval-atrial junction. Final catheter positioning was confirmed and documented with a spot radiographic image. The catheter aspirates and flushes normally. The catheter was flushed with appropriate volume heparin dwells. The catheter exit site was secured with a 0-Prolene retention suture. The venotomy incision was closed with Dermabond and Steri-strips. Dressings were applied. The patient tolerated the procedure well without immediate post procedural complication. FINDINGS: After catheter placement, the tip lies within the superior cavoatrial junction. The catheter aspirates and flushes normally and is ready for immediate use. IMPRESSION: Successful placement of 25 cm single lumen tunneled PICC catheter via the right internal jugular vein with tip terminating at the superior caval atrial junction. The catheter is ready for immediate use. Electronically Signed   By: Sandi Mariscal M.D.   On: 09/16/2017 17:32    EKG: Orders placed or performed during the hospital encounter of 02/17/17  . EKG 12-Lead  . EKG 12-Lead     Hospital Course: ADRIENNE DELAY is a 62 y.o. who was admitted to [facility] They were brought to the operating room on 09/15/2017 and underwent Procedure(s): IRRIGATION AND DEBRIDEMENT KNEE; arthroscopic clean out.  Patient tolerated the procedure well and was later transferred to the recovery room and then to the orthopaedic floor for postoperative care.  They were given PO and IV analgesics for pain control following their surgery.  They were given 24  hours of postoperative antibiotics of  Anti-infectives (From admission, onward)   Start     Dose/Rate Route Frequency Ordered Stop   09/18/17 2200  tenofovir (VIREAD) tablet 300 mg  Status:  Discontinued     300 mg Oral Every Sat (Hemodialysis) 09/15/17 1836 09/17/17 1346   09/17/17 2000  DAPTOmycin (CUBICIN) 762.5 mg in sodium chloride 0.9 % IVPB  Status:  Discontinued     8 mg/kg  95.3 kg 230.5 mL/hr over 30 Minutes Intravenous Every 48 hours 09/16/17 1403 09/17/17 0925   09/17/17 1100  DAPTOmycin (CUBICIN) 762.5 mg in sodium chloride 0.9 % IVPB  Status:  Discontinued     8 mg/kg  95.3 kg 230.5 mL/hr over 30 Minutes Intravenous Every 48 hours 09/17/17 0925 09/17/17 1346   09/17/17 0930  DAPTOmycin (CUBICIN)  762.5 mg in sodium chloride 0.9 % IVPB  Status:  Discontinued     8 mg/kg  95.3 kg 230.5 mL/hr over 30 Minutes Intravenous Every 24 hours 09/17/17 0920 09/17/17 0924   09/17/17 0000  daptomycin (CUBICIN) IVPB     760 mg Intravenous Every 48 hours 09/17/17 1249 10/14/17 2359   09/17/17 0000  DAPTOmycin 762.5 mg in sodium chloride 0.9 % 100 mL     8 mg/kg  95.3 kg 230.5 mL/hr over 30 Minutes Intravenous Every 48 hours 09/17/17 0920     09/15/17 2200  raltegravir (ISENTRESS) tablet 400 mg  Status:  Discontinued     400 mg Oral 2 times daily 09/15/17 1836 09/17/17 1346   09/15/17 2200  lamiVUDine (EPIVIR) 10 MG/ML solution 25 mg  Status:  Discontinued     25 mg Oral Daily at bedtime 09/15/17 1929 09/17/17 1346   09/15/17 2100  vancomycin (VANCOCIN) IVPB 1000 mg/200 mL premix     1,000 mg 200 mL/hr over 60 Minutes Intravenous  Once 09/15/17 1947 09/15/17 2203   09/15/17 1845  vancomycin (VANCOCIN) IVPB 1000 mg/200 mL premix  Status:  Discontinued     1,000 mg 200 mL/hr over 60 Minutes Intravenous Every 12 hours 09/15/17 1836 09/15/17 1859   09/15/17 1330  vancomycin (VANCOCIN) IVPB 1000 mg/200 mL premix     1,000 mg 200 mL/hr over 60 Minutes Intravenous On call to O.R. 09/15/17  1250 09/15/17 1536    Discharge planning consulted to help with postop disposition and equipment needs.  Patient had a fair night on the evening of surgery.  They started to get up OOB with therapy on day one. Dressing was changed on day two and the incision was clean and dry.  Drain removed without complication. ID consulted on the patient for decision on apprpriate antibiotic coverage. PICC line was placed and patient was discharged home stable with IV antibiotics. Patient was to resume dialysis upon returning home.    Diet: Cardiac diet Activity:WBAT Follow-up:in 7 days Disposition - Home Discharged Condition: stable   Discharge Instructions    Call MD / Call 911   Complete by:  As directed    If you experience chest pain or shortness of breath, CALL 911 and be transported to the hospital emergency room.  If you develope a fever above 101 F, pus (white drainage) or increased drainage or redness at the wound, or calf pain, call your surgeon's office.   Constipation Prevention   Complete by:  As directed    Drink plenty of fluids.  Prune juice may be helpful.  You may use a stool softener, such as Colace (over the counter) 100 mg twice a day.  Use MiraLax (over the counter) for constipation as needed.   Diet - low sodium heart healthy   Complete by:  As directed    Discharge instructions   Complete by:  As directed    Walk with your walker Weight bearing as tolerated Riley will follow you at home for your therapy and IV medication administration Change your dressing daily or more often if needed. Use sterile gauze and paper taper Shower only, no tub bath. Call if any temperatures greater than 101 or any wound complications: [phone] during the day and ask for Dr. Charlestine Night medical assistant, Brunilda Payor.   Home infusion instructions [facility] May follow [facility] Dosing Protocol; May administer Cathflo as needed to maintain patency of vascular access device.;  Flushing of vascular access  device: per [facility] Protocol: 0.9% NaCl pre/post medica...   Complete by:  As directed    Instructions:  May follow [place] Dosing Protocol   Instructions:  May administer Cathflo as needed to maintain patency of vascular access device.   Instructions:  Flushing of vascular access device: per [facility] Protocol: 0.9% NaCl pre/post medication administration and prn patency; Heparin 100 u/ml, 15m for implanted ports and Heparin 10u/ml, 550mfor all other central venous catheters.   Instructions:  May follow [facility] Anaphylaxis Protocol for First Dose Administration in the home: 0.9% NaCl at 25-50 ml/hr to maintain IV access for protocol meds. Epinephrine 0.3 ml IV/IM PRN and Benadryl 25-50 IV/IM PRN s/s of anaphylaxis.   Instructions:  [facility] Coordinator (RN) to assist per patient IV care needs in the home PRN.   Increase activity slowly as tolerated   Complete by:  As directed      Allergies as of 09/17/2017      Reactions   Lisinopril Anaphylaxis, Shortness Of Breath   Throat swelling   Penicillins Other (See Comments)   Childhood allergy Has patient had a PCN reaction causing immediate rash, facial/tongue/throat swelling, SOB or lightheadedness with hypotension: Unk Has patient had a PCN reaction causing severe rash involving mucus membranes or skin necrosis: Unk Has patient had a PCN reaction that required hospitalization: Unk Has patient had a PCN reaction occurring within the last 10 years: No If all of the above answers are "NO", then may proceed with Cephalosporin use.      Medication List    STOP taking these medications   COMBIVENT RESPIMAT 20-100 MCG/ACT Aers respimat Generic drug:  Ipratropium-Albuterol   doxycycline 100 MG tablet Commonly known as:  ADOXA   ibuprofen 800 MG tablet Commonly known as:  ADVIL,MOTRIN   oxyCODONE-acetaminophen 5-325 MG tablet Commonly known as:  PERCOCET/ROXICET     TAKE these medications     albuterol (2.5 MG/3ML) 0.083% nebulizer solution Commonly known as:  PROVENTIL Take 2.5 mg by nebulization every 6 (six) hours as needed for wheezing or shortness of breath.   ALPRAZolam 0.25 MG tablet Commonly known as:  XANAX Take 0.25 mg by mouth daily as needed for anxiety.   aspirin 325 MG tablet Take 1 tablet (325 mg total) by mouth 2 (two) times daily.   calcium acetate 667 MG capsule Commonly known as:  PHOSLO Take 667-2,001 mg by mouth See admin instructions. 1,334-2,001 mg three times a day with each meal and 667-1,334 mg with each snack   cloNIDine 0.1 mg/24hr patch Commonly known as:  CATAPRES - Dosed in mg/24 hr UNWRAP AND APPLY 1 PATCH TO SKIN EVERY 7 DAYS What changed:  See the new instructions.   clotrimazole-betamethasone cream Commonly known as:  LOTRISONE Apply 1 application topically 2 (two) times daily.   DAPTOmycin 762.5 mg in sodium chloride 0.9 % 100 mL Inject 762.5 mg into the vein every other day.   daptomycin IVPB Commonly known as:  CUBICIN Inject 760 mg into the vein every other day for 27 days. Administer dose after HD on HD days. Indication:  Septic joint Last Day of Therapy:  10/14/2017 Labs - Once weekly:  CBC/D, CRP, ESR, and CPK Fax weekly labs to [phone]   doxazosin 4 MG tablet Commonly known as:  CARDURA Take 4 mg by mouth daily.   epoetin alfa 2000 UNIT/ML injection Commonly known as:  EPOGEN,PROCRIT Inject 2,000 Units into the vein 3 (three) times a week.  ethyl chloride spray Apply 1 application topically daily as needed (HD). At HD   heparin 1000 UNIT/ML injection Inject 3,000 Units into the vein 3 (three) times a week.   HYDROcodone-acetaminophen 10-325 MG tablet Commonly known as:  NORCO Take 1-2 tablets by mouth every 6 (six) hours as needed for severe pain ((score 7 to 10)).   lamivudine 5 MG/ML solution Commonly known as:  EPIVIR HBV TAKE 5 ML BY MOUTH EVERY DAY What changed:    how to take this  when  to take this  additional instructions   methocarbamol 500 MG tablet Commonly known as:  ROBAXIN Take 1 tablet (500 mg total) by mouth every 8 (eight) hours as needed for muscle spasms.   multivitamin Tabs tablet Take 1 tablet by mouth daily.   PROCTOZONE-HC 2.5 % rectal cream Generic drug:  hydrocortisone Place 1 application rectally 2 (two) times daily as needed.   traZODone 50 MG tablet Commonly known as:  DESYREL Take 50 mg by mouth at bedtime as needed for sleep.   VOLTAREN 1 % Gel Generic drug:  diclofenac sodium APPLY 2 GRAMS EXTERNALLY TO THE AFFECTED AREA FOUR TIMES DAILY            Home Infusion Instuctions  (From admission, onward)        Start     Ordered   09/17/17 0000  Home infusion instructions [facility] May follow [place] Dosing Protocol; May administer Cathflo as needed to maintain patency of vascular access device.; Flushing of vascular access device: per [facility] Protocol: 0.9% NaCl pre/post medica...    Question Answer Comment  Instructions May follow [place] Dosing Protocol   Instructions May administer Cathflo as needed to maintain patency of vascular access device.   Instructions Flushing of vascular access device: per [facility] Protocol: 0.9% NaCl pre/post medication administration and prn patency; Heparin 100 u/ml, 18m for implanted ports and Heparin 10u/ml, 54mfor all other central venous catheters.   Instructions May follow [facility] Anaphylaxis Protocol for First Dose Administration in the home: 0.9% NaCl at 25-50 ml/hr to maintain IV access for protocol meds. Epinephrine 0.3 ml IV/IM PRN and Benadryl 25-50 IV/IM PRN s/s of anaphylaxis.   Instructions [facility] Infusion Coordinator (RN) to assist per patient IV care needs in the home PRN.      09/17/17 1249     Follow-up Information    GiLatanya MaudlinMD Follow up on 09/20/2017.   Specialty:  Orthopedic Surgery Contact information: [address][phone]         Signed: AmArdeen JourdainPA-C Orthopaedic Surgery 10/06/2017, 3:23 PM

## 2017-10-07 ENCOUNTER — Emergency Department (HOSPITAL_COMMUNITY)
Admission: EM | Admit: 2017-10-07 | Discharge: 2017-10-07 | Disposition: A | Discharge status: Home / Self Care | Payer: Medicare Other | Attending: Emergency Medicine | Admitting: Emergency Medicine

## 2017-10-07 ENCOUNTER — Encounter (HOSPITAL_COMMUNITY): Payer: Self-pay | Admitting: Emergency Medicine

## 2017-10-07 DIAGNOSIS — D631 Anemia in chronic kidney disease: Secondary | ICD-10-CM | POA: Diagnosis not present

## 2017-10-07 DIAGNOSIS — J449 Chronic obstructive pulmonary disease, unspecified: Secondary | ICD-10-CM | POA: Insufficient documentation

## 2017-10-07 DIAGNOSIS — Z992 Dependence on renal dialysis: Secondary | ICD-10-CM | POA: Insufficient documentation

## 2017-10-07 DIAGNOSIS — R799 Abnormal finding of blood chemistry, unspecified: Secondary | ICD-10-CM | POA: Diagnosis present

## 2017-10-07 DIAGNOSIS — Z880 Allergy status to penicillin: Secondary | ICD-10-CM | POA: Diagnosis not present

## 2017-10-07 DIAGNOSIS — I120 Hypertensive chronic kidney disease with stage 5 chronic kidney disease or end stage renal disease: Secondary | ICD-10-CM | POA: Insufficient documentation

## 2017-10-07 DIAGNOSIS — Z7982 Long term (current) use of aspirin: Secondary | ICD-10-CM | POA: Insufficient documentation

## 2017-10-07 DIAGNOSIS — J45909 Unspecified asthma, uncomplicated: Secondary | ICD-10-CM | POA: Diagnosis not present

## 2017-10-07 DIAGNOSIS — Z79899 Other long term (current) drug therapy: Secondary | ICD-10-CM | POA: Diagnosis not present

## 2017-10-07 DIAGNOSIS — N186 End stage renal disease: Secondary | ICD-10-CM | POA: Diagnosis not present

## 2017-10-07 DIAGNOSIS — B20 Human immunodeficiency virus [HIV] disease: Secondary | ICD-10-CM | POA: Insufficient documentation

## 2017-10-07 DIAGNOSIS — I509 Heart failure, unspecified: Secondary | ICD-10-CM | POA: Diagnosis not present

## 2017-10-07 DIAGNOSIS — Z87891 Personal history of nicotine dependence: Secondary | ICD-10-CM | POA: Insufficient documentation

## 2017-10-07 DIAGNOSIS — D649 Anemia, unspecified: Secondary | ICD-10-CM | POA: Diagnosis not present

## 2017-10-07 LAB — CBC
HCT: 22 % — ABNORMAL LOW (ref 39.0–52.0)
Hemoglobin: 7.4 g/dL — ABNORMAL LOW (ref 13.0–17.0)
MCH: 31.8 pg (ref 26.0–34.0)
MCHC: 33.6 g/dL (ref 30.0–36.0)
MCV: 94.4 fL (ref 78.0–100.0)
Platelets: 197 10*3/uL (ref 150–400)
RBC: 2.33 MIL/uL — AB (ref 4.22–5.81)
RDW: 15.6 % — ABNORMAL HIGH (ref 11.5–15.5)
WBC: 8.4 10*3/uL (ref 4.0–10.5)

## 2017-10-07 LAB — COMPREHENSIVE METABOLIC PANEL
ALK PHOS: 93 U/L (ref 38–126)
ALT: 10 U/L — ABNORMAL LOW (ref 17–63)
ANION GAP: 14 (ref 5–15)
AST: 20 U/L (ref 15–41)
Albumin: 3.3 g/dL — ABNORMAL LOW (ref 3.5–5.0)
BILIRUBIN TOTAL: 0.6 mg/dL (ref 0.3–1.2)
BUN: 39 mg/dL — ABNORMAL HIGH (ref 6–20)
CALCIUM: 9.2 mg/dL (ref 8.9–10.3)
CO2: 25 mmol/L (ref 22–32)
Chloride: 93 mmol/L — ABNORMAL LOW (ref 101–111)
Creatinine, Ser: 9.76 mg/dL — ABNORMAL HIGH (ref 0.61–1.24)
GFR, EST AFRICAN AMERICAN: 6 mL/min — AB (ref >60)
GFR, EST NON AFRICAN AMERICAN: 5 mL/min — AB (ref >60)
Glucose, Bld: 160 mg/dL — ABNORMAL HIGH (ref 65–99)
Potassium: 3.3 mmol/L — ABNORMAL LOW (ref 3.5–5.1)
SODIUM: 132 mmol/L — AB (ref 135–145)
TOTAL PROTEIN: 7.1 g/dL (ref 6.5–8.1)

## 2017-10-07 LAB — TYPE AND SCREEN
ABO/RH(D): O NEG
ANTIBODY SCREEN: NEGATIVE

## 2017-10-07 NOTE — ED Triage Notes (Signed)
Pt received call from dialysis nurse stating his Hgb was low (6.3) Pt has hx of same, recently admitted for same. Pt is asymptomatic.

## 2017-10-07 NOTE — Progress Notes (Signed)
Internal Medicine Clinic Attending  Case discussed with Dr. Svalina  at the time of the visit.  We reviewed the resident's history and exam and pertinent patient test results.  I agree with the assessment, diagnosis, and plan of care documented in the resident's note.  

## 2017-10-07 NOTE — ED Provider Notes (Signed)
[place] EMERGENCY DEPARTMENT Provider Note  CSN: [id number] Arrival date & time: 10/07/17 2057  Chief Complaint(s) Abnormal Lab  HPI Jeffrey [name] is a 62 y.o. male with an extensive past medical history listed below including HIV on antiretroviral meds, ESRD on home dialysis, chronic anemia requiring transfusions, left knee septic arthritis currently on IV antibiotics who presents to the emergency department after being called by the dialysis center stating that his hemoglobin was 6.3.  Patient had labs obtained earlier today to recheck his potassium but was called regarding his hemoglobin.  Patient is denying any fatigue, shortness of breath, chest pain, dizziness, lightheadedness.  He denies any other physical complaints.  HPI  Past Medical History Past Medical History:  Diagnosis Date  . Anemia   . Anxiety   . Arthritis   . Asthma    per pt hx  . End stage renal disease on home HD 07/10/2011   Started HD in September 2012 at [facility] with a tunneled HD catheter, now on home HD with NxtStage. Dialyzing through AVF L lower arm with buttonhole technique as of mid 2014. His brother does the HD treatments at home.  They are roommates for 23 years.  The brother works 3rd shift and gets off about 8am and then puts Mr Folks on HD in the morning after getting home. Most of the time he does HD about 4 times a week, for about 4 hours per treatment. Cause of ESRD was HTN according to patient. He says he let his health go and ending up with complications, and that he didn't like seeing doctors in those days.  He says he was diagnosed with severe HTN when he lived in [place] in his 26's.   . Hepatitis B carrier ([place])   . HIV infection ([place])   . Hypertension   . Hyperthyroidism    normal now  . Pneumonia several yrs ago  . Seizure ([place])   . Seizures ([place]) 06/02/2011   x 1 none since  . Thrombocytopenia [facility])    Patient Active Problem List   Diagnosis Date Noted  .  Symptomatic anemia 09/23/2017  . Antibiotic long-term use   . ESRD on hemodialysis ([place])   . Septic arthritis ([place]) 09/15/2017  . Effusion of left knee 09/08/2017  . Intertrigo 07/09/2017  . AC joint arthropathy 06/16/2017  . Pseudoaneurysm of arteriovenous dialysis fistula ([place]) 12/23/2016  . Eczema 12/10/2016  . Bilateral low back pain without sciatica   . Aortic valve insufficiency   . AIDS ([place])   . Fall 09/08/2016  . Knee abrasion, right, initial encounter 09/08/2016  . Lumbosacral spondylosis without myelopathy 08/03/2016  . Pulmonary nodules 08/03/2016  . Essential hypertension   . Anemia of chronic disease 04/08/2016  . Secondary hyperparathyroidism ([place]) 02/09/2014  . Nuclear sclerotic cataract of both eyes 12/12/2013  . Myopia with presbyopia of both eyes 10/26/2013  . COPD (chronic obstructive pulmonary disease) ([place]) 06/27/2013  . Thrombocytopenia ([place]) 01/09/2013  . HBV (hepatitis B virus) infection 01/09/2013  . Anxiety 07/22/2011  . End stage renal disease on home HD 07/10/2011  . HIV disease ([place]) 06/17/2011   Home Medication(s) Prior to Admission medications   Medication Sig Start Date End Date Taking? Authorizing Provider  albuterol (PROVENTIL) (2.5 MG/3ML) 0.083% nebulizer solution Take 2.5 mg by nebulization every 6 (six) hours as needed for wheezing or shortness of breath.    [provider]  ALPRAZolam Duanne Moron) 0.25 MG tablet Take 0.25 mg by mouth  daily as needed for anxiety.    [provider]  aspirin 325 MG tablet Take 1 tablet (325 mg total) by mouth 2 (two) times daily. 09/16/17   Cecilio Asper, Amber, PA-C  calcium acetate (PHOSLO) 667 MG capsule Take 667-2,001 mg by mouth See admin instructions. 1,334-2,001 mg three times a day with each meal and 667-1,334 mg with each snack 06/06/15   [provider]  carvedilol (COREG) 25 MG tablet Take 1 tablet (25 mg total) by mouth 2 (two) times daily with a meal. 10/05/17   Alphonzo Grieve, MD    cloNIDine (CATAPRES - DOSED IN MG/24 HR) 0.1 mg/24hr patch UNWRAP AND APPLY 1 PATCH TO SKIN EVERY 7 DAYS Patient taking differently: UNWRAP AND APPLY 1 PATCH TO SKIN EVERY 7 DAYS (Fridays) 06/28/17   Velna Ochs, MD  clotrimazole-betamethasone (LOTRISONE) cream Apply 1 application topically 2 (two) times daily. 09/08/17   Campbell Riches, MD  daptomycin (CUBICIN) IVPB Inject 760 mg into the vein every other day for 27 days. Administer dose after HD on HD days. Indication:  Septic joint Last Day of Therapy:  10/14/2017 Labs - Once weekly:  CBC/D, CRP, ESR, and CPK Fax weekly labs to [phone] 09/17/17 10/14/17  Carlyle Basques, MD  DAPTOmycin 762.5 mg in sodium chloride 0.9 % 100 mL Inject 762.5 mg into the vein every other day. 09/17/17   Constable, Amber, PA-C  doxazosin (CARDURA) 4 MG tablet Take 4 mg by mouth daily. 07/04/17   [provider]  epoetin alfa (EPOGEN,PROCRIT) 2000 UNIT/ML injection Inject 2,000 Units into the vein 3 (three) times a week.    [provider]  ethyl chloride spray Apply 1 application topically daily as needed (HD). At HD 05/20/15   [provider]  heparin 1000 UNIT/ML injection Inject 3,000 Units into the vein 3 (three) times a week.    [provider]  hydrALAZINE (APRESOLINE) 100 MG tablet Take 1 tablet (100 mg total) by mouth 2 (two) times daily. Patient taking differently: Take 100 mg by mouth 3 (three) times daily.  09/22/17   Velna Ochs, MD  HYDROcodone-acetaminophen (NORCO) 10-325 MG tablet Take 1-2 tablets by mouth every 6 (six) hours as needed for severe pain ((score 7 to 10)). 09/16/17   Constable, Amber, PA-C  ISENTRESS 400 MG tablet TAKE 1 TABLET(400 MG) BY MOUTH TWICE DAILY 10/05/17   Campbell Riches, MD  lamivudine (EPIVIR HBV) 5 MG/ML solution TAKE 5 ML BY MOUTH EVERY DAY Patient taking differently: Take by mouth at bedtime. TAKE 5 ML BY MOUTH EVERY DAY 09/08/17   Campbell Riches, MD   methocarbamol (ROBAXIN) 500 MG tablet Take 1 tablet (500 mg total) by mouth every 8 (eight) hours as needed for muscle spasms. 09/16/17   Constable, Amber, PA-C  multivitamin (RENA-VIT) TABS tablet Take 1 tablet by mouth daily.      [provider]  PROCTOZONE-HC 2.5 % rectal cream Place 1 application rectally 2 (two) times daily as needed. 08/06/17   [provider]  tenofovir (VIREAD) 300 MG tablet TAKE 1 TABLET BY MOUTH EVERY SATURDAY AFTER DIALYSIS 10/05/17   Campbell Riches, MD  traZODone (DESYREL) 50 MG tablet Take 50 mg by mouth at bedtime as needed for sleep.    [provider]  VOLTAREN 1 % GEL APPLY 2 GRAMS EXTERNALLY TO THE AFFECTED AREA FOUR TIMES DAILY 09/08/17   Campbell Riches, MD  Past Surgical History Past Surgical History:  Procedure Laterality Date  . AV FISTULA PLACEMENT  06/02/11   Left radiocephalic AVF  . COLONOSCOPY    . fistulaogram     x 2 last 2 years  . IR FLUORO GUIDE CV LINE RIGHT  09/16/2017  . IR US GUIDE VASC ACCESS RIGHT  09/16/2017  . IRRIGATION AND DEBRIDEMENT KNEE Left 09/15/2017   Procedure: IRRIGATION AND DEBRIDEMENT KNEE; arthroscopic clean out;  Surgeon: [name], MD;  Location: [facility];  Service: Orthopedics;  Laterality: Left;  . OTHER SURGICAL HISTORY     removal temporary HD catheter   . REVISON OF ARTERIOVENOUS FISTULA Left 10/10/2015   Procedure: REVISON OF LEFT RADIOCEPHALIC ARTERIOVENOUS FISTULA;  Surgeon: Angelia Mould, MD;  Location: [facility];  Service: Vascular;  Laterality: Left;  . REVISON OF ARTERIOVENOUS FISTULA Left 02/07/2016   Procedure: REPAIR OF PSEUDO-ANEUREYSM OF LEFT ARM  ARTERIOVENOUS FISTULA;  Surgeon: Angelia Mould, MD;  Location: [facility];  Service: Vascular;  Laterality: Left;  . REVISON OF ARTERIOVENOUS FISTULA Left 12/15/7122   Procedure:  PLICATION OF LEFT ARM RADIOCEPHALIC ARTERIOVENOUS FISTULA PSEUDOANEURYSM;  Surgeon: Angelia Mould, MD;  Location: [facility];  Service: Vascular;  Laterality: Left;  . RIGHT/LEFT HEART CATH AND CORONARY ANGIOGRAPHY N/A 02/17/2017   Procedure: Right/Left Heart Cath and Coronary Angiography;  Surgeon: Sherren Mocha, MD;  Location: [facility] CV LAB;  Service: Cardiovascular;  Laterality: N/A;  . TEE WITHOUT CARDIOVERSION N/A 09/11/2016   Procedure: TRANSESOPHAGEAL ECHOCARDIOGRAM (TEE);  Surgeon: Dorothy Spark, MD;  [facility] END[facility];  Service: Cardiovascular;  Laterality: N/A;   Family History Family History  Problem Relation Age of Onset  . Hypertension Mother   . Diabetes Mother   . Cancer Father     Social History Social History   Tobacco Use  . Smoking status: Former Smoker    Years: 10.00    Types: Cigarettes    Last attempt to quit: 08/08/2015    Years since quitting: 2.1  . Smokeless tobacco: Never Used  . Tobacco comment: casual smoking for 10 years  Substance Use Topics  . Alcohol use: No    Alcohol/week: 0.0 oz    Comment: quit 2004  . Drug use: No    Comment: uds (+) cocaine in 08/2011 but pt states was taking sudafed at the time   Allergies Lisinopril and Penicillins  Review of Systems Review of Systems All other systems are reviewed and are negative for acute change except as noted in the HPI  Physical Exam Vital Signs  I have reviewed the triage vital signs BP (!) 168/89 (BP Location: Right Arm)   Pulse 87   Temp 98.4 F (36.9 C) (Oral)   Resp 18   Ht 6' (1.829 m)   Wt 88.5 kg (195 lb)   SpO2 100%   BMI 26.45 kg/m   Physical Exam  Constitutional: He is oriented to person, place, and time. He appears well-developed and well-nourished. No distress.  HENT:  Head: Normocephalic and atraumatic.  Nose: Nose normal.  Eyes: Conjunctivae and EOM are normal. Pupils are equal, round, and reactive to light. Right eye exhibits no discharge. Left  eye exhibits no discharge. No scleral icterus.  Neck: Normal range of motion. Neck supple.  Cardiovascular: Normal rate and regular rhythm. Exam reveals no gallop and no friction rub.  No murmur heard. Pulmonary/Chest: Effort normal and breath sounds normal. No stridor. No respiratory distress. He has no rales.    Abdominal:  Soft. He exhibits no distension. There is no tenderness.  Musculoskeletal: He exhibits no edema or tenderness.       Arms: Neurological: He is alert and oriented to person, place, and time.  Skin: Skin is warm and dry. No rash noted. He is not diaphoretic. No erythema.  Psychiatric: He has a normal mood and affect.  Vitals reviewed.   ED Results and Treatments Labs (all labs ordered are listed, but only abnormal results are displayed) Labs Reviewed  COMPREHENSIVE METABOLIC PANEL - Abnormal; Notable for the following components:      Result Value   Sodium 132 (*)    Potassium 3.3 (*)    Chloride 93 (*)    Glucose, Bld 160 (*)    BUN 39 (*)    Creatinine, Ser 9.76 (*)    Albumin 3.3 (*)    ALT 10 (*)    GFR calc non Af Amer 5 (*)    GFR calc Af Amer 6 (*)    All other components within normal limits  CBC - Abnormal; Notable for the following components:   RBC 2.33 (*)    Hemoglobin 7.4 (*)    HCT 22.0 (*)    RDW 15.6 (*)    All other components within normal limits  TYPE AND SCREEN                                                                                                                         EKG  EKG Interpretation  Date/Time:    Ventricular Rate:    PR Interval:    QRS Duration:   QT Interval:    QTC Calculation:   R Axis:     Text Interpretation:        Radiology No results found. Pertinent labs & imaging results that were available during my care of the patient were reviewed by me and considered in my medical decision making (see chart for details).  Medications Ordered in ED Medications - No data to display                                                                                                                                   Procedures Procedures  (including critical care time)  Medical Decision Making / ED Course I have reviewed the nursing notes for this encounter and the patient's prior records (if available in EHR or on provided paperwork).  Patient's hemoglobin here is 7.4 which is close to his baseline of 8-10.  He is currently asymptomatic and denies any recent hematochezia or melena.  Potassium is 3.3.  Other labs close to patient's baseline and grossly reassuring.  No need for transfusion at this time.  No need for additional workup.  The patient appears reasonably screened and/or stabilized for discharge and I doubt any other medical condition or other [facility] requiring further screening, evaluation, or treatment in the ED at this time prior to discharge.  The patient is safe for discharge with strict return precautions.   Final Clinical Impression(s) / ED Diagnoses Final diagnoses:  Chronic anemia   Disposition: Discharge  Condition: Good  I have discussed the results, Dx and Tx plan with the patient who expressed understanding and agree(s) with the plan. Discharge instructions discussed at great length. The patient was given strict return precautions who verbalized understanding of the instructions. No further questions at time of discharge.    ED Discharge Orders    None       Follow Up: Primary care provider   As needed  Infectious disease  On 10/12/2017 As scheduled      This chart was dictated using voice recognition software.  Despite best efforts to proofread,  errors can occur which can change the documentation meaning.   Fatima Blank, MD 10/07/17 2329

## 2017-10-08 ENCOUNTER — Other Ambulatory Visit: Payer: Self-pay | Admitting: Pharmacist

## 2017-10-08 DIAGNOSIS — I509 Heart failure, unspecified: Secondary | ICD-10-CM | POA: Diagnosis not present

## 2017-10-08 DIAGNOSIS — Z992 Dependence on renal dialysis: Secondary | ICD-10-CM | POA: Diagnosis not present

## 2017-10-08 DIAGNOSIS — D631 Anemia in chronic kidney disease: Secondary | ICD-10-CM | POA: Diagnosis not present

## 2017-10-08 DIAGNOSIS — B20 Human immunodeficiency virus [HIV] disease: Secondary | ICD-10-CM | POA: Diagnosis not present

## 2017-10-08 DIAGNOSIS — D696 Thrombocytopenia, unspecified: Secondary | ICD-10-CM | POA: Diagnosis not present

## 2017-10-08 DIAGNOSIS — N186 End stage renal disease: Secondary | ICD-10-CM | POA: Diagnosis not present

## 2017-10-08 DIAGNOSIS — I120 Hypertensive chronic kidney disease with stage 5 chronic kidney disease or end stage renal disease: Secondary | ICD-10-CM | POA: Diagnosis not present

## 2017-10-08 DIAGNOSIS — M00062 Staphylococcal arthritis, left knee: Secondary | ICD-10-CM | POA: Diagnosis not present

## 2017-10-11 DIAGNOSIS — D696 Thrombocytopenia, unspecified: Secondary | ICD-10-CM | POA: Diagnosis not present

## 2017-10-11 DIAGNOSIS — Z992 Dependence on renal dialysis: Secondary | ICD-10-CM | POA: Diagnosis not present

## 2017-10-11 DIAGNOSIS — M00062 Staphylococcal arthritis, left knee: Secondary | ICD-10-CM | POA: Diagnosis not present

## 2017-10-11 DIAGNOSIS — I120 Hypertensive chronic kidney disease with stage 5 chronic kidney disease or end stage renal disease: Secondary | ICD-10-CM | POA: Diagnosis not present

## 2017-10-11 DIAGNOSIS — B199 Unspecified viral hepatitis without hepatic coma: Secondary | ICD-10-CM | POA: Diagnosis not present

## 2017-10-11 DIAGNOSIS — I509 Heart failure, unspecified: Secondary | ICD-10-CM | POA: Diagnosis not present

## 2017-10-11 DIAGNOSIS — D631 Anemia in chronic kidney disease: Secondary | ICD-10-CM | POA: Diagnosis not present

## 2017-10-11 DIAGNOSIS — B20 Human immunodeficiency virus [HIV] disease: Secondary | ICD-10-CM | POA: Diagnosis not present

## 2017-10-11 DIAGNOSIS — N186 End stage renal disease: Secondary | ICD-10-CM | POA: Diagnosis not present

## 2017-10-12 ENCOUNTER — Encounter: Payer: Self-pay | Admitting: Emergency Medicine

## 2017-10-12 ENCOUNTER — Inpatient Hospital Stay
Admission: EM | Admit: 2017-10-12 | Discharge: 2017-10-14 | DRG: 682 | Disposition: A | Discharge status: Home / Self Care | Payer: Medicare Other | Attending: Internal Medicine | Admitting: Internal Medicine

## 2017-10-12 ENCOUNTER — Inpatient Hospital Stay: Payer: Medicare Other | Admitting: Infectious Diseases

## 2017-10-12 ENCOUNTER — Other Ambulatory Visit: Payer: Self-pay

## 2017-10-12 DIAGNOSIS — Z7982 Long term (current) use of aspirin: Secondary | ICD-10-CM

## 2017-10-12 DIAGNOSIS — I129 Hypertensive chronic kidney disease with stage 1 through stage 4 chronic kidney disease, or unspecified chronic kidney disease: Secondary | ICD-10-CM | POA: Diagnosis not present

## 2017-10-12 DIAGNOSIS — B181 Chronic viral hepatitis B without delta-agent: Secondary | ICD-10-CM | POA: Diagnosis present

## 2017-10-12 DIAGNOSIS — F419 Anxiety disorder, unspecified: Secondary | ICD-10-CM | POA: Diagnosis present

## 2017-10-12 DIAGNOSIS — T82868A Thrombosis of vascular prosthetic devices, implants and grafts, initial encounter: Secondary | ICD-10-CM | POA: Diagnosis not present

## 2017-10-12 DIAGNOSIS — Z888 Allergy status to other drugs, medicaments and biological substances status: Secondary | ICD-10-CM | POA: Diagnosis not present

## 2017-10-12 DIAGNOSIS — M47896 Other spondylosis, lumbar region: Secondary | ICD-10-CM | POA: Diagnosis not present

## 2017-10-12 DIAGNOSIS — Z87891 Personal history of nicotine dependence: Secondary | ICD-10-CM

## 2017-10-12 DIAGNOSIS — D649 Anemia, unspecified: Secondary | ICD-10-CM | POA: Diagnosis present

## 2017-10-12 DIAGNOSIS — N186 End stage renal disease: Secondary | ICD-10-CM | POA: Diagnosis present

## 2017-10-12 DIAGNOSIS — M00869 Arthritis due to other bacteria, unspecified knee: Secondary | ICD-10-CM | POA: Diagnosis not present

## 2017-10-12 DIAGNOSIS — T859XXA Unspecified complication of internal prosthetic device, implant and graft, initial encounter: Secondary | ICD-10-CM

## 2017-10-12 DIAGNOSIS — M25462 Effusion, left knee: Secondary | ICD-10-CM | POA: Diagnosis not present

## 2017-10-12 DIAGNOSIS — R918 Other nonspecific abnormal finding of lung field: Secondary | ICD-10-CM | POA: Diagnosis not present

## 2017-10-12 DIAGNOSIS — G40909 Epilepsy, unspecified, not intractable, without status epilepticus: Secondary | ICD-10-CM | POA: Diagnosis present

## 2017-10-12 DIAGNOSIS — N2581 Secondary hyperparathyroidism of renal origin: Secondary | ICD-10-CM | POA: Diagnosis not present

## 2017-10-12 DIAGNOSIS — D631 Anemia in chronic kidney disease: Secondary | ICD-10-CM

## 2017-10-12 DIAGNOSIS — M545 Low back pain: Secondary | ICD-10-CM | POA: Diagnosis not present

## 2017-10-12 DIAGNOSIS — B20 Human immunodeficiency virus [HIV] disease: Secondary | ICD-10-CM | POA: Diagnosis not present

## 2017-10-12 DIAGNOSIS — Z8249 Family history of ischemic heart disease and other diseases of the circulatory system: Secondary | ICD-10-CM | POA: Diagnosis not present

## 2017-10-12 DIAGNOSIS — I351 Nonrheumatic aortic (valve) insufficiency: Secondary | ICD-10-CM | POA: Diagnosis not present

## 2017-10-12 DIAGNOSIS — I509 Heart failure, unspecified: Secondary | ICD-10-CM | POA: Diagnosis not present

## 2017-10-12 DIAGNOSIS — Z992 Dependence on renal dialysis: Secondary | ICD-10-CM | POA: Diagnosis not present

## 2017-10-12 DIAGNOSIS — I120 Hypertensive chronic kidney disease with stage 5 chronic kidney disease or end stage renal disease: Secondary | ICD-10-CM | POA: Diagnosis not present

## 2017-10-12 DIAGNOSIS — Z880 Allergy status to penicillin: Secondary | ICD-10-CM

## 2017-10-12 DIAGNOSIS — J449 Chronic obstructive pulmonary disease, unspecified: Secondary | ICD-10-CM | POA: Diagnosis present

## 2017-10-12 DIAGNOSIS — Z452 Encounter for adjustment and management of vascular access device: Secondary | ICD-10-CM

## 2017-10-12 DIAGNOSIS — M009 Pyogenic arthritis, unspecified: Secondary | ICD-10-CM | POA: Diagnosis present

## 2017-10-12 DIAGNOSIS — I440 Atrioventricular block, first degree: Secondary | ICD-10-CM | POA: Diagnosis present

## 2017-10-12 DIAGNOSIS — I10 Essential (primary) hypertension: Secondary | ICD-10-CM | POA: Diagnosis not present

## 2017-10-12 DIAGNOSIS — N189 Chronic kidney disease, unspecified: Secondary | ICD-10-CM | POA: Diagnosis not present

## 2017-10-12 DIAGNOSIS — Z21 Asymptomatic human immunodeficiency virus [HIV] infection status: Secondary | ICD-10-CM | POA: Diagnosis not present

## 2017-10-12 DIAGNOSIS — Z833 Family history of diabetes mellitus: Secondary | ICD-10-CM | POA: Diagnosis not present

## 2017-10-12 DIAGNOSIS — E039 Hypothyroidism, unspecified: Secondary | ICD-10-CM | POA: Diagnosis present

## 2017-10-12 DIAGNOSIS — L304 Erythema intertrigo: Secondary | ICD-10-CM | POA: Diagnosis not present

## 2017-10-12 LAB — BASIC METABOLIC PANEL
ANION GAP: 12 (ref 5–15)
BUN: 44 mg/dL — AB (ref 6–20)
CALCIUM: 8.7 mg/dL — AB (ref 8.9–10.3)
CO2: 24 mmol/L (ref 22–32)
Chloride: 95 mmol/L — ABNORMAL LOW (ref 101–111)
Creatinine, Ser: 9.3 mg/dL — ABNORMAL HIGH (ref 0.61–1.24)
GFR calc Af Amer: 6 mL/min — ABNORMAL LOW (ref >60)
GFR calc non Af Amer: 5 mL/min — ABNORMAL LOW (ref >60)
Glucose, Bld: 113 mg/dL — ABNORMAL HIGH (ref 65–99)
Potassium: 3.4 mmol/L — ABNORMAL LOW (ref 3.5–5.1)
SODIUM: 131 mmol/L — AB (ref 135–145)

## 2017-10-12 LAB — PREPARE RBC (CROSSMATCH)

## 2017-10-12 LAB — CBC
HCT: 20.1 % — ABNORMAL LOW (ref 40.0–52.0)
Hemoglobin: 6.8 g/dL — ABNORMAL LOW (ref 13.0–18.0)
MCH: 32.4 pg (ref 26.0–34.0)
MCHC: 34.1 g/dL (ref 32.0–36.0)
MCV: 95.1 fL (ref 80.0–100.0)
Platelets: 278 10*3/uL (ref 150–440)
RBC: 2.11 MIL/uL — ABNORMAL LOW (ref 4.40–5.90)
RDW: 17 % — AB (ref 11.5–14.5)
WBC: 10.4 10*3/uL (ref 3.8–10.6)

## 2017-10-12 LAB — ABO/RH: ABO/RH(D): O NEG

## 2017-10-12 LAB — TROPONIN I: TROPONIN I: 0.04 ng/mL — AB (ref <0.03)

## 2017-10-12 MED ORDER — CALCIUM ACETATE (PHOS BINDER) 667 MG PO CAPS
667.0000 mg | ORAL_CAPSULE | ORAL | Status: DC
  Administered 2017-10-12 – 2017-10-13 (×2): 667 mg via ORAL
  Filled 2017-10-12: qty 1
  Filled 2017-10-12: qty 2

## 2017-10-12 MED ORDER — ALBUTEROL SULFATE (2.5 MG/3ML) 0.083% IN NEBU: 2.5000 mg | INHALATION_SOLUTION | Freq: Four times a day (QID) | RESPIRATORY_TRACT | Status: DC | PRN

## 2017-10-12 MED ORDER — ALPRAZOLAM 0.25 MG PO TABS
0.2500 mg | ORAL_TABLET | Freq: Every day | ORAL | Status: DC | PRN
  Administered 2017-10-13 – 2017-10-14 (×2): 0.25 mg via ORAL
  Filled 2017-10-12 (×2): qty 1

## 2017-10-12 MED ORDER — SODIUM CHLORIDE 0.9 % IV SOLN
760.0000 mg | INTRAVENOUS | Status: DC
  Administered 2017-10-12: 760 mg via INTRAVENOUS
  Filled 2017-10-12 (×2): qty 15.2

## 2017-10-12 MED ORDER — LAMIVUDINE 5 MG/ML PO SOLN
25.0000 mg | Freq: Every day | ORAL | Status: DC
  Filled 2017-10-12: qty 5

## 2017-10-12 MED ORDER — TRAZODONE HCL 50 MG PO TABS: 50.0000 mg | ORAL_TABLET | Freq: Every evening | ORAL | Status: DC | PRN

## 2017-10-12 MED ORDER — LAMIVUDINE 10 MG/ML PO SOLN
25.0000 mg | Freq: Every day | ORAL | Status: DC
  Administered 2017-10-12 – 2017-10-13 (×2): 25 mg via ORAL
  Filled 2017-10-12 (×3): qty 2.5

## 2017-10-12 MED ORDER — HEPARIN SODIUM (PORCINE) 5000 UNIT/ML IJ SOLN
5000.0000 [IU] | Freq: Three times a day (TID) | INTRAMUSCULAR | Status: DC
  Administered 2017-10-12 – 2017-10-14 (×4): 5000 [IU] via SUBCUTANEOUS
  Filled 2017-10-12 (×4): qty 1

## 2017-10-12 MED ORDER — HYDRALAZINE HCL 50 MG PO TABS
100.0000 mg | ORAL_TABLET | Freq: Three times a day (TID) | ORAL | Status: DC
  Administered 2017-10-12 – 2017-10-14 (×5): 100 mg via ORAL
  Filled 2017-10-12 (×5): qty 2

## 2017-10-12 MED ORDER — HYDROCODONE-ACETAMINOPHEN 10-325 MG PO TABS
1.0000 | ORAL_TABLET | Freq: Four times a day (QID) | ORAL | Status: DC | PRN
  Administered 2017-10-13: 2 via ORAL
  Filled 2017-10-12: qty 2

## 2017-10-12 MED ORDER — DAPTOMYCIN IV (FOR PTA / DISCHARGE USE ONLY): 760.0000 mg | INTRAVENOUS | Status: DC

## 2017-10-12 MED ORDER — ASPIRIN 81 MG PO CHEW
81.0000 mg | CHEWABLE_TABLET | Freq: Every day | ORAL | Status: DC
  Administered 2017-10-13 – 2017-10-14 (×2): 81 mg via ORAL
  Filled 2017-10-12 (×2): qty 1

## 2017-10-12 MED ORDER — RALTEGRAVIR POTASSIUM 400 MG PO TABS
400.0000 mg | ORAL_TABLET | Freq: Two times a day (BID) | ORAL | Status: DC
  Administered 2017-10-12 – 2017-10-14 (×3): 400 mg via ORAL
  Filled 2017-10-12 (×5): qty 1

## 2017-10-12 MED ORDER — METHOCARBAMOL 500 MG PO TABS: 500.0000 mg | ORAL_TABLET | Freq: Three times a day (TID) | ORAL | Status: DC | PRN

## 2017-10-12 MED ORDER — TENOFOVIR DISOPROXIL FUMARATE 300 MG PO TABS: 300.0000 mg | ORAL_TABLET | ORAL | Status: DC

## 2017-10-12 MED ORDER — RENA-VITE PO TABS
1.0000 | ORAL_TABLET | Freq: Every day | ORAL | Status: DC
  Administered 2017-10-14: 1 via ORAL
  Filled 2017-10-12: qty 1

## 2017-10-12 MED ORDER — CARVEDILOL 25 MG PO TABS
25.0000 mg | ORAL_TABLET | Freq: Two times a day (BID) | ORAL | Status: DC
  Administered 2017-10-13 – 2017-10-14 (×3): 25 mg via ORAL
  Filled 2017-10-12 (×3): qty 1

## 2017-10-12 MED ORDER — SODIUM CHLORIDE 0.9 % IV SOLN: 10.0000 mL/h | Freq: Once | INTRAVENOUS | Status: DC

## 2017-10-12 MED ORDER — CLONIDINE HCL 0.1 MG/24HR TD PTWK: 0.1000 mg | MEDICATED_PATCH | TRANSDERMAL | Status: DC

## 2017-10-12 MED ORDER — DOXAZOSIN MESYLATE 2 MG PO TABS
4.0000 mg | ORAL_TABLET | Freq: Every day | ORAL | Status: DC
  Administered 2017-10-12 – 2017-10-13 (×2): 4 mg via ORAL
  Filled 2017-10-12: qty 2
  Filled 2017-10-12 (×2): qty 1

## 2017-10-12 MED ORDER — DOCUSATE SODIUM 100 MG PO CAPS
100.0000 mg | ORAL_CAPSULE | Freq: Two times a day (BID) | ORAL | Status: DC | PRN
Start: 2017-10-12 — End: 2017-10-14

## 2017-10-12 NOTE — ED Triage Notes (Signed)
Pt presents with low hemoglobin per [facility] of 6.9. Pt has blood taken every week and had 6.3 last week, went to [facility] and it was 7.4. Pt was asymptomatic then and is asymptomatic today. Pt alert & oriented with NAD noted.

## 2017-10-12 NOTE — H&P (Signed)
[facility] NAME: Jeffrey [name]    MR#:  [id number]  DATE OF BIRTH:  1956-08-09  DATE OF ADMISSION:  10/12/2017  PRIMARY CARE PHYSICIAN: Velna Ochs, MD   REQUESTING/REFERRING PHYSICIAN: Alfred Levins  CHIEF COMPLAINT:   Chief Complaint  Patient presents with  . Abnormal Lab    HISTORY OF PRESENT ILLNESS: Jeffrey [name]  is a 62 y.o. male with a known history of anemia, anxiety, asthma, end-stage renal disease on hemodialysis, hepatitis B carrier, HIV infection, hypertension, hypothyroidism, seizure- had right knee infection and on IV daptomycin with his hemodialysis for last 1 month.  He is also receiving antiretroviral therapy and hemodialysis plus injection epogen at home by his brother. He has a visiting nurse who sends the lab work and noted today to have anemia so Dr. Holley Raring called him to go to emergency room for getting admitted for blood transfusion and further workup. Patient complains of feeling generalized weakness, he denies any blood loss in his stool.  PAST MEDICAL HISTORY:   Past Medical History:  Diagnosis Date  . Anemia   . Anxiety   . Arthritis   . Asthma    per pt hx  . End stage renal disease on home HD 07/10/2011   Started HD in September 2012 at [facility] with a tunneled HD catheter, now on home HD with [facility]. Dialyzing through AVF L lower arm with buttonhole technique as of mid 2014. His brother does the HD treatments at home.  They are roommates for 23 years.  The brother works 3rd shift and gets off about 8am and then puts Mr Owczarzak on HD in the morning after getting home. Most of the time he does HD about 4 times a week, for about 4 hours per treatment. Cause of ESRD was HTN according to patient. He says he let his health go and ending up with complications, and that he didn't like seeing doctors in those days.  He says he was diagnosed with severe HTN when he lived in [place] in his 60's.   . Hepatitis B carrier  ([place])   . HIV infection ([place])   . Hypertension   . Hyperthyroidism    normal now  . Pneumonia several yrs ago  . Seizure ([place])   . Seizures ([place]) 06/02/2011   x 1 none since  . Thrombocytopenia ([place])     PAST SURGICAL HISTORY:  Past Surgical History:  Procedure Laterality Date  . AV FISTULA PLACEMENT  06/02/11   Left radiocephalic AVF  . COLONOSCOPY    . fistulaogram     x 2 last 2 years  . IR FLUORO GUIDE CV LINE RIGHT  09/16/2017  . IR US GUIDE VASC ACCESS RIGHT  09/16/2017  . IRRIGATION AND DEBRIDEMENT KNEE Left 09/15/2017   Procedure: IRRIGATION AND DEBRIDEMENT KNEE; arthroscopic clean out;  Surgeon: [name], MD;  Location: [facility];  Service: Orthopedics;  Laterality: Left;  . OTHER SURGICAL HISTORY     removal temporary HD catheter   . REVISON OF ARTERIOVENOUS FISTULA Left 10/10/2015   Procedure: REVISON OF LEFT RADIOCEPHALIC ARTERIOVENOUS FISTULA;  Surgeon: Angelia Mould, MD;  Location: [facility];  Service: Vascular;  Laterality: Left;  . REVISON OF ARTERIOVENOUS FISTULA Left 02/07/2016   Procedure: REPAIR OF PSEUDO-ANEUREYSM OF LEFT ARM  ARTERIOVENOUS FISTULA;  Surgeon: Angelia Mould, MD;  Location: [facility];  Service: Vascular;  Laterality: Left;  . REVISON OF ARTERIOVENOUS FISTULA Left 04/10/2016  Procedure: PLICATION OF LEFT ARM RADIOCEPHALIC ARTERIOVENOUS FISTULA PSEUDOANEURYSM;  Surgeon: Angelia Mould, MD;  Location: [facility];  Service: Vascular;  Laterality: Left;  . RIGHT/LEFT HEART CATH AND CORONARY ANGIOGRAPHY N/A 02/17/2017   Procedure: Right/Left Heart Cath and Coronary Angiography;  Surgeon: Sherren Mocha, MD;  Location: [facility];  Service: Cardiovascular;  Laterality: N/A;  . TEE WITHOUT CARDIOVERSION N/A 09/11/2016   Procedure: TRANSESOPHAGEAL ECHOCARDIOGRAM (TEE);  Surgeon: Dorothy Spark, MD;  Location: [facility]COPY;  Service: Cardiovascular;  Laterality: N/A;    SOCIAL HISTORY:  Social History   Tobacco Use  . Smoking  status: Former Smoker    Years: 10.00    Types: Cigarettes    Last attempt to quit: 08/08/2015    Years since quitting: 2.1  . Smokeless tobacco: Never Used  . Tobacco comment: casual smoking for 10 years  Substance Use Topics  . Alcohol use: No    Alcohol/week: 0.0 oz    Comment: quit 2004    FAMILY HISTORY:  Family History  Problem Relation Age of Onset  . Hypertension Mother   . Diabetes Mother   . Cancer Father     DRUG ALLERGIES:  Allergies  Allergen Reactions  . Lisinopril Anaphylaxis and Shortness Of Breath    Throat swelling  . Penicillins Other (See Comments)    Childhood allergy Has patient had a PCN reaction causing immediate rash, facial/tongue/throat swelling, SOB or lightheadedness with hypotension: Unk Has patient had a PCN reaction causing severe rash involving mucus membranes or skin necrosis: Unk Has patient had a PCN reaction that required hospitalization: Unk Has patient had a PCN reaction occurring within the last 10 years: No If all of the above answers are "NO", then may proceed with Cephalosporin use.     REVIEW OF SYSTEMS:   CONSTITUTIONAL: No fever, positive for fatigue or weakness.  EYES: No blurred or double vision.  EARS, NOSE, AND THROAT: No tinnitus or ear pain.  RESPIRATORY: No cough, shortness of breath, wheezing or hemoptysis.  CARDIOVASCULAR: No chest pain, orthopnea, edema.  GASTROINTESTINAL: No nausea, vomiting, diarrhea or abdominal pain.  GENITOURINARY: No dysuria, hematuria.  ENDOCRINE: No polyuria, nocturia,  HEMATOLOGY: No anemia, easy bruising or bleeding SKIN: No rash or lesion. MUSCULOSKELETAL: No joint pain or arthritis.   NEUROLOGIC: No tingling, numbness, weakness.  PSYCHIATRY: No anxiety or depression.   MEDICATIONS AT HOME:  Prior to Admission medications   Medication Sig Start Date End Date Taking? Authorizing Provider  albuterol (PROVENTIL) (2.5 MG/3ML) 0.083% nebulizer solution Take 2.5 mg by nebulization  every 6 (six) hours as needed for wheezing or shortness of breath.   Yes [provider]  ALPRAZolam (XANAX) 0.25 MG tablet Take 0.25 mg by mouth daily as needed for anxiety.   Yes [provider]  aspirin 325 MG tablet Take 1 tablet (325 mg total) by mouth 2 (two) times daily. Patient taking differently: Take 81 mg by mouth daily.  09/16/17  Yes Constable, Amber, PA-C  calcium acetate (PHOSLO) 667 MG capsule Take 667-2,001 mg by mouth See admin instructions. 1,334-2,001 mg three times a day with each meal and 667-1,334 mg with each snack 06/06/15  Yes [provider]  carvedilol (COREG) 25 MG tablet Take 1 tablet (25 mg total) by mouth 2 (two) times daily with a meal. 10/05/17  Yes Svalina, Estill Dooms, MD  cloNIDine (CATAPRES - DOSED IN MG/24 HR) 0.1 mg/24hr patch UNWRAP AND APPLY 1 PATCH TO SKIN EVERY 7 DAYS Patient taking differently:  UNWRAP AND APPLY 1 PATCH TO SKIN EVERY 7 DAYS (Fridays) 06/28/17  Yes Velna Ochs, MD  clotrimazole-betamethasone (LOTRISONE) cream Apply 1 application topically 2 (two) times daily. 09/08/17  Yes Campbell Riches, MD  DAPTOmycin 762.5 mg in sodium chloride 0.9 % 100 mL Inject 762.5 mg into the vein every other day. 09/17/17  Yes Constable, Amber, PA-C  doxazosin (CARDURA) 4 MG tablet Take 4 mg by mouth at bedtime.  07/04/17  Yes [provider]  epoetin alfa (EPOGEN,PROCRIT) 2000 UNIT/ML injection Inject 2,000 Units into the vein 3 (three) times a week.   Yes [provider]  ethyl chloride spray Apply 1 application topically daily as needed (HD). At HD 05/20/15  Yes [provider]  hydrALAZINE (APRESOLINE) 100 MG tablet Take 1 tablet (100 mg total) by mouth 2 (two) times daily. Patient taking differently: Take 100 mg by mouth 3 (three) times daily.  09/22/17  Yes Velna Ochs, MD  HYDROcodone-acetaminophen (NORCO) 10-325 MG tablet Take 1-2 tablets by mouth every 6 (six) hours as needed for severe pain ((score  7 to 10)). 09/16/17  Yes Constable, Amber, PA-C  ISENTRESS 400 MG tablet TAKE 1 TABLET(400 MG) BY MOUTH TWICE DAILY 10/05/17  Yes Campbell Riches, MD  lamivudine (EPIVIR HBV) 5 MG/ML solution TAKE 5 ML BY MOUTH EVERY DAY Patient taking differently: Take by mouth at bedtime. TAKE 5 ML BY MOUTH EVERY DAY 09/08/17  Yes Campbell Riches, MD  methocarbamol (ROBAXIN) 500 MG tablet Take 1 tablet (500 mg total) by mouth every 8 (eight) hours as needed for muscle spasms. 09/16/17  Yes Constable, Amber, PA-C  multivitamin (RENA-VIT) TABS tablet Take 1 tablet by mouth daily.     Yes [provider]  PROCTOZONE-HC 2.5 % rectal cream Place 1 application rectally 2 (two) times daily as needed. 08/06/17  Yes [provider]  tenofovir (VIREAD) 300 MG tablet TAKE 1 TABLET BY MOUTH EVERY SATURDAY AFTER DIALYSIS 10/05/17  Yes Campbell Riches, MD  traZODone (DESYREL) 50 MG tablet Take 50 mg by mouth at bedtime as needed for sleep.   Yes [provider]  VOLTAREN 1 % GEL APPLY 2 GRAMS EXTERNALLY TO THE AFFECTED AREA FOUR TIMES DAILY 09/08/17  Yes Campbell Riches, MD  daptomycin (CUBICIN) IVPB Inject 760 mg into the vein every other day for 27 days. Administer dose after HD on HD days. Indication:  Septic joint Last Day of Therapy:  10/14/2017 Labs - Once weekly:  CBC/D, CRP, ESR, and CPK Fax weekly labs to [phone] Patient not taking: Reported on 10/12/2017 09/17/17 10/14/17  Carlyle Basques, MD  heparin 1000 UNIT/ML injection Inject 3,000 Units into the vein 4 (four) times a week.     [provider]      PHYSICAL EXAMINATION:   VITAL SIGNS: Blood pressure (!) 162/89, pulse 70, temperature 98.6 F (37 C), temperature source Oral, resp. rate 13, height 6' (1.829 m), weight 88.5 kg (195 lb), SpO2 (!) 68 %.  GENERAL:  62 y.o.-year-old patient lying in the bed with no acute distress.  EYES: Pupils equal, round, reactive to light and accommodation. No scleral  icterus. Extraocular muscles intact. Conjunctiva pale. HEENT: Head atraumatic, normocephalic. Oropharynx and nasopharynx clear.  NECK:  Supple, no jugular venous distention. No thyroid enlargement, no tenderness.  LUNGS: Normal breath sounds bilaterally, no wheezing, rales,rhonchi or crepitation. No use of accessory muscles of respiration.  CARDIOVASCULAR: S1, S2 normal. No murmurs, rubs, or gallops.  ABDOMEN: Soft, nontender, nondistended.  Bowel sounds present. No organomegaly or mass.  EXTREMITIES: No pedal edema, cyanosis, or clubbing.  NEUROLOGIC: Cranial nerves II through XII are intact. Muscle strength 5/5 in all extremities. Sensation intact. Gait not checked.  PSYCHIATRIC: The patient is alert and oriented x 3.  SKIN: No obvious rash, lesion, or ulcer.   LABORATORY PANEL:   CBC Recent Labs  Lab 10/07/17 2111 10/12/17 1430  WBC 8.4 10.4  HGB 7.4* 6.8*  HCT 22.0* 20.1*  PLT 197 278  MCV 94.4 95.1  MCH 31.8 32.4  MCHC 33.6 34.1  RDW 15.6* 17.0*   ------------------------------------------------------------------------------------------------------------------  Chemistries  Recent Labs  Lab 10/07/17 2111 10/12/17 1430  NA 132* 131*  K 3.3* 3.4*  CL 93* 95*  CO2 25 24  GLUCOSE 160* 113*  BUN 39* 44*  CREATININE 9.76* 9.30*  CALCIUM 9.2 8.7*  AST 20  --   ALT 10*  --   ALKPHOS 93  --   BILITOT 0.6  --    ------------------------------------------------------------------------------------------------------------------ estimated creatinine clearance is 8.9 mL/min (A) (by C-G formula based on SCr of 9.3 mg/dL (H)). ------------------------------------------------------------------------------------------------------------------ No results for input(s): TSH, T4TOTAL, T3FREE, THYROIDAB in the last 72 hours.  Invalid input(s): FREET3   Coagulation profile No results for input(s): INR, PROTIME in the last 168  hours. ------------------------------------------------------------------------------------------------------------------- No results for input(s): DDIMER in the last 72 hours. -------------------------------------------------------------------------------------------------------------------  Cardiac Enzymes Recent Labs  Lab 10/12/17 1430  TROPONINI 0.04*   ------------------------------------------------------------------------------------------------------------------ Invalid input(s): POCBNP  ---------------------------------------------------------------------------------------------------------------  Urinalysis    Component Value Date/Time   COLORURINE AMBER (A) 06/14/2011 1513   APPEARANCEUR CLOUDY (A) 06/14/2011 1513   LABSPEC 1.020 06/14/2011 1513   PHURINE 6.0 06/14/2011 1513   GLUCOSEU NEGATIVE 06/14/2011 1513   HGBUR LARGE (A) 06/14/2011 1513   BILIRUBINUR SMALL (A) 06/14/2011 1513   KETONESUR NEGATIVE 06/14/2011 1513   PROTEINUR >300 (A) 06/14/2011 1513   UROBILINOGEN 0.2 06/14/2011 1513   NITRITE NEGATIVE 06/14/2011 1513   LEUKOCYTESUR SMALL (A) 06/14/2011 1513     RADIOLOGY: No results found.  EKG: Orders placed or performed during the hospital encounter of 10/12/17  . ED EKG  . ED EKG    IMPRESSION AND PLAN:  * Symptomatic anemia   Transfuse 1 unit of PRBC, hematology consult.   Patient is compliant to epogen with his hemodialysis.  * End-stage renal disease on hemodialysis   Nephrology consult.  * Recent joint infection   Continue daptomycin with dialysis.  * Hypertension   Continue home medications.  * HIV   Continue antiretroviral therapy.   All the records are reviewed and case discussed with ED provider. Management plans discussed with the patient, family and they are in agreement.  CODE STATUS: full. Code Status History    Date Active Date Inactive Code Status Order ID Comments User Context   09/23/2017 15:27 09/26/2017 15:50 Full  Code [id number]  Maryellen Pile, MD Inpatient   09/15/2017 18:36 09/17/2017 13:51 Full Code [id number]  [name], MD Inpatient   09/15/2017 18:36 09/15/2017 18:36 Full Code [id number]  [name], MD Inpatient   02/17/2017 10:17 02/17/2017 17:22 Full Code [id number]  Sherren Mocha, MD Inpatient   09/09/2016 07:47 09/12/2016 18:28 Full Code [id number]  Dellia Nims, MD ED   08/03/2016 22:37 08/08/2016 15:05 Full Code [id number]  Etta Quill, DO ED   04/08/2016 18:01 04/10/2016 21:01 Full Code [id number]  Juliet Rude, MD Inpatient   06/12/2013 08:26 06/14/2013 22:49 Full Code [id number]  [place], [place]  Advance Directive Documentation     Most Recent Value  Type of Advance Directive  Healthcare Power of Attorney, Living will  Pre-existing out of facility DNR order (yellow form or pink MOST form)  No data  "MOST" Form in Place?  No data      brother is present in the room during my visit.  TOTAL TIME TAKING CARE OF THIS PATIENT: 50 minutes.    Vaughan Basta M.D on 10/12/2017   Between 7am to 6pm - Pager - [phone]  After 6pm go to [email or web address] - password [email or web address] [facility]  Office  [phone]  CC: Primary care physician; Velna Ochs, MD   Note: This dictation was prepared with Dragon dictation along with smaller phrase technology. Any transcriptional errors that result from this process are unintentional.

## 2017-10-12 NOTE — ED Provider Notes (Signed)
[facility] Emergency Department Provider Note  ____________________________________________  Time seen: Approximately 4:26 PM  I have reviewed the triage vital signs and the nursing notes.   HISTORY  Chief Complaint Abnormal Lab   HPI Jeffrey [name] is a 62 y.o. male with a history of ESRD on home HD 4 times a week (last one last night), HIV (undetectable viral load and CD4 count of 460 on 5/18), anemia of chronic disease, hypertension who presents for evaluation of a worsening anemia. Patient has lab work done weekly and today was noted to have a hemoglobin of 6.9. Patient denies melena, hematemesis, hematochezia, hemoptysis, hematuria. He denies dizziness, chest pain, shortness of breath, generalized weakness or fatigue. Patient denies being on any blood thinners. Patient is currently undergoing IV antibiotics for septic joint bacteremia. He denies any fever or chills at home, nausea or vomiting, abdominal pain, knee pain, back pain, chest pain, shortness of breath.  Chief Complaint: anemia Quality: acute on chronic Severity: severe Duration: 5 days Context: ESRD and recent sepsis Associated signs/symptoms: no dizziness, fatigue, chest pain, shortness of breath, or active bleeding    Past Medical History:  Diagnosis Date  . Anemia   . Anxiety   . Arthritis   . Asthma    per pt hx  . End stage renal disease on home HD 07/10/2011   Started HD in September 2012 at [facility] with a tunneled HD catheter, now on home HD with [facility]. Dialyzing through AVF L lower arm with buttonhole technique as of mid 2014. His brother does the HD treatments at home.  They are roommates for 23 years.  The brother works 3rd shift and gets off about 8am and then puts Mr Boffa on HD in the morning after getting home. Most of the time he does HD about 4 times a week, for about 4 hours per treatment. Cause of ESRD was HTN according to patient. He says he let his health go and ending  up with complications, and that he didn't like seeing doctors in those days.  He says he was diagnosed with severe HTN when he lived in [place] in his 32's.   . Hepatitis B carrier ([place])   . HIV infection ([place])   . Hypertension   . Hyperthyroidism    normal now  . Pneumonia several yrs ago  . Seizure ([place])   . Seizures ([place]) 06/02/2011   x 1 none since  . Thrombocytopenia [facility])     Patient Active Problem List   Diagnosis Date Noted  . Symptomatic anemia 09/23/2017  . Antibiotic long-term use   . ESRD on hemodialysis ([place])   . Septic arthritis ([place]) 09/15/2017  . Effusion of left knee 09/08/2017  . Intertrigo 07/09/2017  . AC joint arthropathy 06/16/2017  . Pseudoaneurysm of arteriovenous dialysis fistula ([place]) 12/23/2016  . Eczema 12/10/2016  . Bilateral low back pain without sciatica   . Aortic valve insufficiency   . AIDS ([place])   . Fall 09/08/2016  . Knee abrasion, right, initial encounter 09/08/2016  . Lumbosacral spondylosis without myelopathy 08/03/2016  . Pulmonary nodules 08/03/2016  . Essential hypertension   . Anemia of chronic disease 04/08/2016  . Secondary hyperparathyroidism ([place]) 02/09/2014  . Nuclear sclerotic cataract of both eyes 12/12/2013  . Myopia with presbyopia of both eyes 10/26/2013  . COPD (chronic obstructive pulmonary disease) ([place]) 06/27/2013  . Thrombocytopenia ([place]) 01/09/2013  . HBV (hepatitis B virus) infection 01/09/2013  . Anxiety 07/22/2011  . End  stage renal disease on home HD 07/10/2011  . HIV disease ([place]) 06/17/2011    Past Surgical History:  Procedure Laterality Date  . AV FISTULA PLACEMENT  06/02/11   Left radiocephalic AVF  . COLONOSCOPY    . fistulaogram     x 2 last 2 years  . IR FLUORO GUIDE CV LINE RIGHT  09/16/2017  . IR US GUIDE VASC ACCESS RIGHT  09/16/2017  . IRRIGATION AND DEBRIDEMENT KNEE Left 09/15/2017   Procedure: IRRIGATION AND DEBRIDEMENT KNEE; arthroscopic clean out;  Surgeon: [name], MD;   Location: [facility];  Service: Orthopedics;  Laterality: Left;  . OTHER SURGICAL HISTORY     removal temporary HD catheter   . REVISON OF ARTERIOVENOUS FISTULA Left 10/10/2015   Procedure: REVISON OF LEFT RADIOCEPHALIC ARTERIOVENOUS FISTULA;  Surgeon: Angelia Mould, MD;  Location: [place];  Service: Vascular;  Laterality: Left;  . REVISON OF ARTERIOVENOUS FISTULA Left 02/07/2016   Procedure: REPAIR OF PSEUDO-ANEUREYSM OF LEFT ARM  ARTERIOVENOUS FISTULA;  Surgeon: Angelia Mould, MD;  Location: [place];  Service: Vascular;  Laterality: Left;  . REVISON OF ARTERIOVENOUS FISTULA Left 03/12/2978   Procedure: PLICATION OF LEFT ARM RADIOCEPHALIC ARTERIOVENOUS FISTULA PSEUDOANEURYSM;  Surgeon: Angelia Mould, MD;  Location: [place];  Service: Vascular;  Laterality: Left;  . RIGHT/LEFT HEART CATH AND CORONARY ANGIOGRAPHY N/A 02/17/2017   Procedure: Right/Left Heart Cath and Coronary Angiography;  Surgeon: Sherren Mocha, MD;  Location: [place] CV LAB;  Service: Cardiovascular;  Laterality: N/A;  . TEE WITHOUT CARDIOVERSION N/A 09/11/2016   Procedure: TRANSESOPHAGEAL ECHOCARDIOGRAM (TEE);  Surgeon: Dorothy Spark, MD;  Location: [place];  Service: Cardiovascular;  Laterality: N/A;    Prior to Admission medications   Medication Sig Start Date End Date Taking? Authorizing Provider  albuterol (PROVENTIL) (2.5 MG/3ML) 0.083% nebulizer solution Take 2.5 mg by nebulization every 6 (six) hours as needed for wheezing or shortness of breath.    [provider]  ALPRAZolam Duanne Moron) 0.25 MG tablet Take 0.25 mg by mouth daily as needed for anxiety.    [provider]  aspirin 325 MG tablet Take 1 tablet (325 mg total) by mouth 2 (two) times daily. 09/16/17   Cecilio Asper, Amber, PA-C  calcium acetate (PHOSLO) 667 MG capsule Take 667-2,001 mg by mouth See admin instructions. 1,334-2,001 mg three times a day with each meal and 667-1,334 mg with each snack 06/06/15   [provider]  carvedilol (COREG) 25 MG tablet Take 1 tablet (25 mg total) by mouth 2 (two) times daily with a meal. 10/05/17   Alphonzo Grieve, MD  cloNIDine (CATAPRES - DOSED IN MG/24 HR) 0.1 mg/24hr patch UNWRAP AND APPLY 1 PATCH TO SKIN EVERY 7 DAYS Patient taking differently: UNWRAP AND APPLY 1 PATCH TO SKIN EVERY 7 DAYS (Fridays) 06/28/17   Velna Ochs, MD  clotrimazole-betamethasone (LOTRISONE) cream Apply 1 application topically 2 (two) times daily. 09/08/17   Campbell Riches, MD  daptomycin (CUBICIN) IVPB Inject 760 mg into the vein every other day for 27 days. Administer dose after HD on HD days. Indication:  Septic joint Last Day of Therapy:  10/14/2017 Labs - Once weekly:  CBC/D, CRP, ESR, and CPK Fax weekly labs to [phone] 09/17/17 10/14/17  Carlyle Basques, MD  DAPTOmycin 762.5 mg in sodium chloride 0.9 % 100 mL Inject 762.5 mg into the vein every other day. 09/17/17   Constable, Amber, PA-C  doxazosin (CARDURA) 4 MG tablet Take 4 mg by mouth daily.  07/04/17   [provider]  epoetin alfa (EPOGEN,PROCRIT) 2000 UNIT/ML injection Inject 2,000 Units into the vein 3 (three) times a week.    [provider]  ethyl chloride spray Apply 1 application topically daily as needed (HD). At HD 05/20/15   [provider]  heparin 1000 UNIT/ML injection Inject 3,000 Units into the vein 3 (three) times a week.    [provider]  hydrALAZINE (APRESOLINE) 100 MG tablet Take 1 tablet (100 mg total) by mouth 2 (two) times daily. Patient taking differently: Take 100 mg by mouth 3 (three) times daily.  09/22/17   Velna Ochs, MD  HYDROcodone-acetaminophen (NORCO) 10-325 MG tablet Take 1-2 tablets by mouth every 6 (six) hours as needed for severe pain ((score 7 to 10)). 09/16/17   Constable, Amber, PA-C  ISENTRESS 400 MG tablet TAKE 1 TABLET(400 MG) BY MOUTH TWICE DAILY 10/05/17   Campbell Riches, MD  lamivudine (EPIVIR HBV) 5 MG/ML solution TAKE 5 ML  BY MOUTH EVERY DAY Patient taking differently: Take by mouth at bedtime. TAKE 5 ML BY MOUTH EVERY DAY 09/08/17   Campbell Riches, MD  methocarbamol (ROBAXIN) 500 MG tablet Take 1 tablet (500 mg total) by mouth every 8 (eight) hours as needed for muscle spasms. 09/16/17   Constable, Amber, PA-C  multivitamin (RENA-VIT) TABS tablet Take 1 tablet by mouth daily.      [provider]  PROCTOZONE-HC 2.5 % rectal cream Place 1 application rectally 2 (two) times daily as needed. 08/06/17   [provider]  tenofovir (VIREAD) 300 MG tablet TAKE 1 TABLET BY MOUTH EVERY SATURDAY AFTER DIALYSIS 10/05/17   Campbell Riches, MD  traZODone (DESYREL) 50 MG tablet Take 50 mg by mouth at bedtime as needed for sleep.    [provider]  VOLTAREN 1 % GEL APPLY 2 GRAMS EXTERNALLY TO THE AFFECTED AREA FOUR TIMES DAILY 09/08/17   Campbell Riches, MD    Allergies Lisinopril and Penicillins  Family History  Problem Relation Age of Onset  . Hypertension Mother   . Diabetes Mother   . Cancer Father     Social History Social History   Tobacco Use  . Smoking status: Former Smoker    Years: 10.00    Types: Cigarettes    Last attempt to quit: 08/08/2015    Years since quitting: 2.1  . Smokeless tobacco: Never Used  . Tobacco comment: casual smoking for 10 years  Substance Use Topics  . Alcohol use: No    Alcohol/week: 0.0 oz    Comment: quit 2004  . Drug use: No    Comment: uds (+) cocaine in 08/2011 but pt states was taking sudafed at the time    Review of Systems  Constitutional: Negative for fever. Eyes: Negative for visual changes. ENT: Negative for sore throat. Neck: No neck pain  Cardiovascular: Negative for chest pain. Respiratory: Negative for shortness of breath. Gastrointestinal: Negative for abdominal pain, vomiting or diarrhea. Genitourinary: Negative for dysuria. Musculoskeletal: Negative for back pain. Skin: Negative for rash. Neurological:  Negative for headaches, weakness or numbness. Psych: No SI or HI  ____________________________________________   PHYSICAL EXAM:  VITAL SIGNS: ED Triage Vitals  Enc Vitals Group     BP 10/12/17 1417 (!) 173/86     Pulse Rate 10/12/17 1417 78     Resp 10/12/17 1417 18     Temp 10/12/17 1417 99.1 F (37.3 C)     Temp Source 10/12/17 1417 Oral  SpO2 10/12/17 1417 98 %     Weight 10/12/17 1418 195 lb (88.5 kg)     Height 10/12/17 1418 6' (1.829 m)     Head Circumference --      Peak Flow --      Pain Score 10/12/17 1429 0     Pain Loc --      Pain Edu? --      Excl. in [place]? --     Constitutional: Alert and oriented. Well appearing and in no apparent distress. HEENT:      Head: Normocephalic and atraumatic.         Eyes: Conjunctivae are normal. Sclera is non-icteric.       Mouth/Throat: Mucous membranes are moist.       Neck: Supple with no signs of meningismus. Cardiovascular: Regular rate and rhythm. No murmurs, gallops, or rubs. 2+ symmetrical distal pulses are present in all extremities. No JVD. Respiratory: Normal respiratory effort. Lungs are clear to auscultation bilaterally. No wheezes, crackles, or rhonchi.  Gastrointestinal: Soft, non tender, and non distended with positive bowel sounds. No rebound or guarding. Musculoskeletal: Nontender with normal range of motion in all extremities. No edema, cyanosis, or erythema of extremities. Neurologic: Normal speech and language. Face is symmetric. Moving all extremities. No gross focal neurologic deficits are appreciated. Skin: Skin is warm, dry and intact. No rash noted. Psychiatric: Mood and affect are normal. Speech and behavior are normal.  ____________________________________________   LABS (all labs ordered are listed, but only abnormal results are displayed)  Labs Reviewed  BASIC METABOLIC PANEL - Abnormal; Notable for the following components:      Result Value   Sodium 131 (*)    Potassium 3.4 (*)     Chloride 95 (*)    Glucose, Bld 113 (*)    BUN 44 (*)    Creatinine, Ser 9.30 (*)    Calcium 8.7 (*)    GFR calc non Af Amer 5 (*)    GFR calc Af Amer 6 (*)    All other components within normal limits  CBC - Abnormal; Notable for the following components:   RBC 2.11 (*)    Hemoglobin 6.8 (*)    HCT 20.1 (*)    RDW 17.0 (*)    All other components within normal limits  TROPONIN I - Abnormal; Notable for the following components:   Troponin I 0.04 (*)    All other components within normal limits  POC OCCULT BLOOD, ED  TYPE AND SCREEN  PREPARE RBC (CROSSMATCH)  ABO/RH   ____________________________________________  EKG  ED ECG REPORT I, Rudene Re, the attending physician, personally viewed and interpreted this ECG.  Normal sinus rhythm, first-degree AV block, normal QRS and QTc intervals, normal axis, no ST elevations or depressions.  ____________________________________________  RADIOLOGY  none  ____________________________________________   PROCEDURES  Procedure(s) performed: None Procedures Critical Care performed:  Yes  CRITICAL CARE Performed by: Rudene Re  ?  Total critical care time: 35 min  Critical care time was exclusive of separately billable procedures and treating other patients.  Critical care was necessary to treat or prevent imminent or life-threatening deterioration.  Critical care was time spent personally by me on the following activities: development of treatment plan with patient and/or surrogate as well as nursing, discussions with consultants, evaluation of patient's response to treatment, examination of patient, obtaining history from patient or surrogate, ordering and performing treatments and interventions, ordering and review of laboratory studies, ordering and review of  radiographic studies, pulse oximetry and re-evaluation of patient's condition.  ____________________________________________   INITIAL IMPRESSION /  ASSESSMENT AND PLAN / ED COURSE   62 y.o. male with a history of ESRD on home HD 4 times a week (last one last night), HIV (undetectable viral load and CD4 count of 460 on 5/18), anemia of chronic disease, hypertension who presents for evaluation of a worsening anemia. Patient with no evidence of active bleeding. HD stable. Not on blood thinners. Patient's hemoglobin has been slowly declining since January 5. There is no signs of sepsis on exam. At this time patient with severe anemia requiring blood transfusion with a hemoglobin of 6.8. I have discussed with patient his nephrologist Dr. Holley Raring who requested the patient be admitted for further evaluation of worsening anemia. Discussed with the hospitalist for admission. We'll transfuse one unit.      As part of my medical decision making, I reviewed the following data within the [facility] notes reviewed and incorporated, Labs reviewed , EKG interpreted , Old chart reviewed, A consult was requested and obtained from this/these consultant(s) Nephrology , Notes from prior ED visits and [place] Controlled Substance Database    Pertinent labs & imaging results that were available during my care of the patient were reviewed by me and considered in my medical decision making (see chart for details).    ____________________________________________   FINAL CLINICAL IMPRESSION(S) / ED DIAGNOSES  Final diagnoses:  Anemia of chronic kidney failure, unspecified stage      NEW MEDICATIONS STARTED DURING THIS VISIT:  ED Discharge Orders    None       Note:  This document was prepared using Dragon voice recognition software and may include unintentional dictation errors.    Alfred Levins, [place], MD 10/12/17 [phone]

## 2017-10-13 DIAGNOSIS — N189 Chronic kidney disease, unspecified: Secondary | ICD-10-CM

## 2017-10-13 DIAGNOSIS — M25462 Effusion, left knee: Secondary | ICD-10-CM

## 2017-10-13 DIAGNOSIS — Z992 Dependence on renal dialysis: Secondary | ICD-10-CM

## 2017-10-13 DIAGNOSIS — Z8619 Personal history of other infectious and parasitic diseases: Secondary | ICD-10-CM

## 2017-10-13 DIAGNOSIS — N186 End stage renal disease: Secondary | ICD-10-CM

## 2017-10-13 DIAGNOSIS — M545 Low back pain: Secondary | ICD-10-CM

## 2017-10-13 DIAGNOSIS — I129 Hypertensive chronic kidney disease with stage 1 through stage 4 chronic kidney disease, or unspecified chronic kidney disease: Secondary | ICD-10-CM

## 2017-10-13 DIAGNOSIS — I351 Nonrheumatic aortic (valve) insufficiency: Secondary | ICD-10-CM

## 2017-10-13 DIAGNOSIS — B20 Human immunodeficiency virus [HIV] disease: Secondary | ICD-10-CM

## 2017-10-13 DIAGNOSIS — Z87891 Personal history of nicotine dependence: Secondary | ICD-10-CM

## 2017-10-13 DIAGNOSIS — J449 Chronic obstructive pulmonary disease, unspecified: Secondary | ICD-10-CM

## 2017-10-13 DIAGNOSIS — D631 Anemia in chronic kidney disease: Secondary | ICD-10-CM

## 2017-10-13 DIAGNOSIS — F419 Anxiety disorder, unspecified: Secondary | ICD-10-CM

## 2017-10-13 DIAGNOSIS — L304 Erythema intertrigo: Secondary | ICD-10-CM

## 2017-10-13 DIAGNOSIS — M47896 Other spondylosis, lumbar region: Secondary | ICD-10-CM

## 2017-10-13 DIAGNOSIS — Z792 Long term (current) use of antibiotics: Secondary | ICD-10-CM

## 2017-10-13 DIAGNOSIS — Z7982 Long term (current) use of aspirin: Secondary | ICD-10-CM

## 2017-10-13 DIAGNOSIS — N2581 Secondary hyperparathyroidism of renal origin: Secondary | ICD-10-CM

## 2017-10-13 DIAGNOSIS — Z79899 Other long term (current) drug therapy: Secondary | ICD-10-CM

## 2017-10-13 DIAGNOSIS — M009 Pyogenic arthritis, unspecified: Secondary | ICD-10-CM

## 2017-10-13 DIAGNOSIS — R918 Other nonspecific abnormal finding of lung field: Secondary | ICD-10-CM

## 2017-10-13 LAB — LACTATE DEHYDROGENASE: LDH: 124 U/L (ref 98–192)

## 2017-10-13 LAB — CBC
HCT: 20 % — ABNORMAL LOW (ref 40.0–52.0)
Hemoglobin: 6.9 g/dL — ABNORMAL LOW (ref 13.0–18.0)
MCH: 32.1 pg (ref 26.0–34.0)
MCHC: 34.5 g/dL (ref 32.0–36.0)
MCV: 93.1 fL (ref 80.0–100.0)
PLATELETS: 227 10*3/uL (ref 150–440)
RBC: 2.14 MIL/uL — ABNORMAL LOW (ref 4.40–5.90)
RDW: 17.6 % — ABNORMAL HIGH (ref 11.5–14.5)
WBC: 8.9 10*3/uL (ref 3.8–10.6)

## 2017-10-13 LAB — BASIC METABOLIC PANEL
Anion gap: 11 (ref 5–15)
BUN: 53 mg/dL — ABNORMAL HIGH (ref 6–20)
CALCIUM: 8.3 mg/dL — AB (ref 8.9–10.3)
CHLORIDE: 97 mmol/L — AB (ref 101–111)
CO2: 26 mmol/L (ref 22–32)
Creatinine, Ser: 10.29 mg/dL — ABNORMAL HIGH (ref 0.61–1.24)
GFR calc Af Amer: 5 mL/min — ABNORMAL LOW (ref >60)
GFR calc non Af Amer: 5 mL/min — ABNORMAL LOW (ref >60)
GLUCOSE: 99 mg/dL (ref 65–99)
Potassium: 3.7 mmol/L (ref 3.5–5.1)
Sodium: 134 mmol/L — ABNORMAL LOW (ref 135–145)

## 2017-10-13 LAB — PREPARE RBC (CROSSMATCH)

## 2017-10-13 LAB — RETICULOCYTES
RBC.: 2.15 MIL/uL — ABNORMAL LOW (ref 4.40–5.90)
Retic Count, Absolute: 75.3 10*3/uL (ref 19.0–183.0)
Retic Ct Pct: 3.5 % — ABNORMAL HIGH (ref 0.4–3.1)

## 2017-10-13 LAB — HEMOGLOBIN AND HEMATOCRIT, BLOOD
HEMATOCRIT: 22.9 % — AB (ref 40.0–52.0)
Hemoglobin: 7.9 g/dL — ABNORMAL LOW (ref 13.0–18.0)

## 2017-10-13 MED ORDER — LIDOCAINE-PRILOCAINE 2.5-2.5 % EX CREA
1.0000 "application " | TOPICAL_CREAM | CUTANEOUS | Status: DC | PRN
  Filled 2017-10-13: qty 5

## 2017-10-13 MED ORDER — HEPARIN SODIUM (PORCINE) 1000 UNIT/ML DIALYSIS: 1000.0000 [IU] | INTRAMUSCULAR | Status: DC | PRN

## 2017-10-13 MED ORDER — PENTAFLUOROPROP-TETRAFLUOROETH EX AERO
1.0000 "application " | INHALATION_SPRAY | CUTANEOUS | Status: DC | PRN
  Filled 2017-10-13: qty 30

## 2017-10-13 MED ORDER — LIDOCAINE HCL (PF) 1 % IJ SOLN
5.0000 mL | INTRAMUSCULAR | Status: DC | PRN
  Filled 2017-10-13: qty 5

## 2017-10-13 MED ORDER — ALTEPLASE 2 MG IJ SOLR: 2.0000 mg | Freq: Once | INTRAMUSCULAR | Status: DC | PRN

## 2017-10-13 MED ORDER — CLONIDINE HCL 0.2 MG/24HR TD PTWK: 0.2000 mg | MEDICATED_PATCH | TRANSDERMAL | Status: DC

## 2017-10-13 MED ORDER — SODIUM CHLORIDE 0.9 % IV SOLN: Freq: Once | INTRAVENOUS | Status: DC

## 2017-10-13 MED ORDER — SODIUM CHLORIDE 0.9 % IV SOLN: 100.0000 mL | INTRAVENOUS | Status: DC | PRN

## 2017-10-13 MED ORDER — ALBUTEROL SULFATE (2.5 MG/3ML) 0.083% IN NEBU
2.5000 mg | INHALATION_SOLUTION | Freq: Once | RESPIRATORY_TRACT | Status: AC
  Administered 2017-10-13: 2.5 mg via RESPIRATORY_TRACT
  Filled 2017-10-13: qty 3

## 2017-10-13 NOTE — Progress Notes (Signed)
Post HD Tx  

## 2017-10-13 NOTE — Progress Notes (Signed)
Pr HD assessment

## 2017-10-13 NOTE — Progress Notes (Signed)
[facility]  Patient Status: Active  [facility] is providing the following services: SN & IV ABX Daptomycin 760mg  q48 until 1/24  If patient discharges after hours, please call [phone].   Jeffrey [name] 10/13/2017, 12:01 PM

## 2017-10-13 NOTE — Consult Note (Signed)
Hematology/Oncology Consult note [facility] [phone] [phone]  Patient Care Team: Velna Ochs, MD as PCP - General (Internal Medicine) Campbell Riches, MD as PCP - Infectious Diseases (Infectious Diseases)   Name of the patient: Jeffrey [name]  [id number]  12/05/1955   Date of visit: 10/13/17 REASON FOR COSULTATION:   History of presenting illness-  This is a 62 year old male with past medical history listed below currently admitted due to symptomatic anemia. Patient is on hemodialysis and reports to have epogen treatment along with hemodialysis. Recently patient reports to have septic arthritis and has been on antibiotics with daptomycin. He also takes antiretroviral medication for HIV. His guaiac was reported to be negative. Denies any hematemesis or hematochezia. Last colonoscopy was about 10 years ago.   Review of systems- Review of Systems  Constitutional: Negative for chills and fever.  HENT: Negative for hearing loss.   Eyes: Negative for blurred vision.  Respiratory: Negative for hemoptysis.   Cardiovascular: Negative for chest pain and palpitations.  Gastrointestinal: Negative for blood in stool, nausea and vomiting.  Genitourinary: Negative for dysuria.  Musculoskeletal: Negative for myalgias.  Skin: Negative for rash.  Neurological: Negative for dizziness.  Endo/Heme/Allergies: Does not bruise/bleed easily.  Psychiatric/Behavioral: Negative for depression.    Allergies  Allergen Reactions  . Lisinopril Anaphylaxis and Shortness Of Breath    Throat swelling  . Penicillins Other (See Comments)    Childhood allergy Has patient had a PCN reaction causing immediate rash, facial/tongue/throat swelling, SOB or lightheadedness with hypotension: Unk Has patient had a PCN reaction causing severe rash involving mucus membranes or skin necrosis: Unk Has patient had a PCN reaction that required hospitalization: Unk Has  patient had a PCN reaction occurring within the last 10 years: No If all of the above answers are "NO", then may proceed with Cephalosporin use.     Patient Active Problem List   Diagnosis Date Noted  . Symptomatic anemia 09/23/2017  . Antibiotic long-term use   . ESRD on hemodialysis ([place])   . Septic arthritis ([place]) 09/15/2017  . Effusion of left knee 09/08/2017  . Intertrigo 07/09/2017  . AC joint arthropathy 06/16/2017  . Pseudoaneurysm of arteriovenous dialysis fistula ([place]) 12/23/2016  . Eczema 12/10/2016  . Bilateral low back pain without sciatica   . Aortic valve insufficiency   . AIDS ([place])   . Fall 09/08/2016  . Knee abrasion, right, initial encounter 09/08/2016  . Lumbosacral spondylosis without myelopathy 08/03/2016  . Pulmonary nodules 08/03/2016  . Essential hypertension   . Anemia of chronic disease 04/08/2016  . Secondary hyperparathyroidism ([place]) 02/09/2014  . Nuclear sclerotic cataract of both eyes 12/12/2013  . Myopia with presbyopia of both eyes 10/26/2013  . COPD (chronic obstructive pulmonary disease) ([place]) 06/27/2013  . Thrombocytopenia ([place]) 01/09/2013  . HBV (hepatitis B virus) infection 01/09/2013  . Anxiety 07/22/2011  . End stage renal disease on home HD 07/10/2011  . HIV disease ([place]) 06/17/2011     Past Medical History:  Diagnosis Date  . Anemia   . Anxiety   . Arthritis   . Asthma    per pt hx  . End stage renal disease on home HD 07/10/2011   Started HD in September 2012 at [facility] with a tunneled HD catheter, now on home HD with NxtStage. Dialyzing through AVF L lower arm with buttonhole technique as of mid 2014. His brother does the HD treatments at home.  They are roommates for 23 years.  The brother  works 3rd shift and gets off about 8am and then puts Mr Spittler on HD in the morning after getting home. Most of the time he does HD about 4 times a week, for about 4 hours per treatment. Cause of ESRD was HTN according to patient. He says he let  his health go and ending up with complications, and that he didn't like seeing doctors in those days.  He says he was diagnosed with severe HTN when he lived in [place] in his 50's.   . Hepatitis B carrier ([place])   . HIV infection ([place])   . Hypertension   . Hyperthyroidism    normal now  . Pneumonia several yrs ago  . Seizure ([place])   . Seizures ([place]) 06/02/2011   x 1 none since  . Thrombocytopenia ([place])      Past Surgical History:  Procedure Laterality Date  . AV FISTULA PLACEMENT  06/02/11   Left radiocephalic AVF  . COLONOSCOPY    . fistulaogram     x 2 last 2 years  . IR FLUORO GUIDE CV LINE RIGHT  09/16/2017  . IR US GUIDE VASC ACCESS RIGHT  09/16/2017  . IRRIGATION AND DEBRIDEMENT KNEE Left 09/15/2017   Procedure: IRRIGATION AND DEBRIDEMENT KNEE; arthroscopic clean out;  Surgeon: [name], MD;  Location: [facility];  Service: Orthopedics;  Laterality: Left;  . OTHER SURGICAL HISTORY     removal temporary HD catheter   . REVISON OF ARTERIOVENOUS FISTULA Left 10/10/2015   Procedure: REVISON OF LEFT RADIOCEPHALIC ARTERIOVENOUS FISTULA;  Surgeon: Angelia Mould, MD;  Location: [place];  Service: Vascular;  Laterality: Left;  . REVISON OF ARTERIOVENOUS FISTULA Left 02/07/2016   Procedure: REPAIR OF PSEUDO-ANEUREYSM OF LEFT ARM  ARTERIOVENOUS FISTULA;  Surgeon: Angelia Mould, MD;  Location: [place];  Service: Vascular;  Laterality: Left;  . REVISON OF ARTERIOVENOUS FISTULA Left 10/22/69   Procedure: PLICATION OF LEFT ARM RADIOCEPHALIC ARTERIOVENOUS FISTULA PSEUDOANEURYSM;  Surgeon: Angelia Mould, MD;  Location: [place];  Service: Vascular;  Laterality: Left;  . RIGHT/LEFT HEART CATH AND CORONARY ANGIOGRAPHY N/A 02/17/2017   Procedure: Right/Left Heart Cath and Coronary Angiography;  Surgeon: Sherren Mocha, MD;  Location: [place] CV LAB;  Service: Cardiovascular;  Laterality: N/A;  . TEE WITHOUT CARDIOVERSION N/A 09/11/2016   Procedure: TRANSESOPHAGEAL  ECHOCARDIOGRAM (TEE);  Surgeon: Dorothy Spark, MD;  Location[facility] ENDOSCOPY;  Service: Cardiovascular;  Laterality: N/A;    Social History   Socioeconomic History  . Marital status: Single    Spouse name: Not on file  . Number of children: Not on file  . Years of education: Not on file  . Highest education level: Not on file  Social Needs  . Financial resource strain: Not on file  . Food insecurity - worry: Not on file  . Food insecurity - inability: Not on file  . Transportation needs - medical: Not on file  . Transportation needs - non-medical: Not on file  Occupational History  . Not on file  Tobacco Use  . Smoking status: Former Smoker    Years: 10.00    Types: Cigarettes    Last attempt to quit: 08/08/2015    Years since quitting: 2.1  . Smokeless tobacco: Never Used  . Tobacco comment: casual smoking for 10 years  Substance and Sexual Activity  . Alcohol use: No    Alcohol/week: 0.0 oz    Comment: quit 2004  . Drug use: No    Comment: uds (+)  cocaine in 08/2011 but pt states was taking sudafed at the time  . Sexual activity: Not Currently    Partners: Male  Other Topics Concern  . 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 Hypertension Mother   . Diabetes Mother   . Cancer Father      Current Facility-Administered Medications:  .  0.9 %  sodium chloride infusion, 10 mL/hr, Intravenous, Once, Alfred Levins, [place], MD .  0.9 %  sodium chloride infusion, , Intravenous, Once, Vaughan Basta, MD .  albuterol (PROVENTIL) (2.5 MG/3ML) 0.083% nebulizer solution 2.5 mg, 2.5 mg, Nebulization, Q6H PRN, Vaughan Basta, MD .  ALPRAZolam Duanne Moron) tablet 0.25 mg, 0.25 mg, Oral, Daily PRN, Vaughan Basta, MD .  aspirin chewable  tablet 81 mg, 81 mg, Oral, Daily, Vaughan Basta, MD, 81 mg at 10/13/17 1659 .  calcium acetate (PHOSLO) capsule 667-2,001 mg, 667-2,001 mg, Oral, See admin instructions, Vaughan Basta, MD, 667 mg at 10/12/17 2212 .  carvedilol (COREG) tablet 25 mg, 25 mg, Oral, BID WC, Vaughan Basta, MD, 25 mg at 10/13/17 1700 .  [START ON 10/15/2017] cloNIDine (CATAPRES - Dosed in mg/24 hr) patch 0.2 mg, 0.2 mg, Transdermal, Weekly, Vaughan Basta, MD .  DAPTOmycin (CUBICIN) 760 mg in sodium chloride 0.9 % IVPB, 760 mg, Intravenous, Q48H, Vaughan Basta, MD, Stopped at 10/12/17 2214 .  docusate sodium (COLACE) capsule 100 mg, 100 mg, Oral, BID PRN, Vaughan Basta, MD .  doxazosin (CARDURA) tablet 4 mg, 4 mg, Oral, QHS, Vaughan Basta, MD, 4 mg at 10/12/17 2122 .  heparin injection 5,000 Units, 5,000 Units, Subcutaneous, Q8H, Vaughan Basta, MD, 5,000 Units at 10/13/17 0504 .  hydrALAZINE (APRESOLINE) tablet 100 mg, 100 mg, Oral, TID, Vaughan Basta, MD, 100 mg at 10/13/17 1700 .  HYDROcodone-acetaminophen (NORCO) 10-325 MG per tablet 1-2 tablet, 1-2 tablet, Oral, Q6H PRN, Vaughan Basta, MD, 2 tablet at 10/13/17 1658 .  lamiVUDine (EPIVIR) 10 MG/ML solution 25 mg, 25 mg, Oral, QHS, Vaughan Basta, MD, 25 mg at 10/12/17 2122 .  methocarbamol (ROBAXIN) tablet 500 mg, 500 mg, Oral, Q8H PRN, Vaughan Basta, MD .  multivitamin (RENA-VIT) tablet 1 tablet, 1 tablet, Oral, Daily, Vaughan Basta, MD, Stopped at 10/13/17 1140 .  raltegravir (ISENTRESS) tablet 400 mg, 400 mg, Oral, BID, Vaughan Basta, MD, Stopped at 10/13/17 1140 .  [START ON 10/16/2017] tenofovir (VIREAD) tablet 300 mg, 300 mg, Oral, Weekly, Vaughan Basta, MD .  traZODone (DESYREL) tablet 50 mg, 50 mg, Oral, QHS PRN, Vaughan Basta, MD   Physical exam:  Vitals:   10/13/17 1415 10/13/17 1430 10/13/17 1445 10/13/17 1517  BP:  (!) 186/80 (!) 175/92 (!) 180/79 (!) 160/73  Pulse: 76 78 78 81  Resp: 12 16 (!) 21 14  Temp:   98.1 F (36.7 C) 98.7 F (37.1 C)  TempSrc:   Oral Oral  SpO2: 100% 99% 98% 100%  Weight:      Height:       GENERAL:Alert, no distress and comfortable.  EYES: no pallor or icterus OROPHARYNX: no thrush or ulceration; good dentition  NECK: supple, no masses felt LYMPH:  no palpable lymphadenopathy in the cervical, axillary or inguinal regions LUNGS: clear to auscultation and  No wheeze  or crackles HEART/CVS: regular rate & rhythm and no murmurs; No lower extremity edema ABDOMEN: abdomen soft, non-tender and normal bowel sounds Musculoskeletal:no cyanosis of digits and no clubbing  PSYCH: alert & oriented x 3  NEURO: no focal motor/sensory deficits SKIN:  no rashes or significant lesions     CMP Latest Ref Rng & Units 10/13/2017  Glucose 65 - 99 mg/dL 99  BUN 6 - 20 mg/dL 53(H)  Creatinine 0.61 - 1.24 mg/dL 10.29(H)  Sodium 135 - 145 mmol/L 134(L)  Potassium 3.5 - 5.1 mmol/L 3.7  Chloride 101 - 111 mmol/L 97(L)  CO2 22 - 32 mmol/L 26  Calcium 8.9 - 10.3 mg/dL 8.3(L)  Total Protein 6.5 - 8.1 g/dL -  Total Bilirubin 0.3 - 1.2 mg/dL -  Alkaline Phos 38 - 126 U/L -  AST 15 - 41 U/L -  ALT 17 - 63 U/L -   CBC Latest Ref Rng & Units 10/13/2017  WBC 3.8 - 10.6 K/uL 8.9  Hemoglobin 13.0 - 18.0 g/dL 6.9(L)  Hematocrit 40.0 - 52.0 % 20.0(L)  Platelets 150 - 440 K/uL 227     Ct Chest Wo Contrast  Result Date: 09/24/2017 CLINICAL DATA:  History of end-stage renal disease on home dialysis, HIV, hepatitis-B, chronic anemia, pulmonary nodule. Follow-up for lung nodule. EXAM: CT CHEST WITHOUT CONTRAST TECHNIQUE: Multidetector CT imaging of the chest was performed following the standard protocol without IV contrast. COMPARISON:  Chest CT dated 03/30/2017. FINDINGS: Cardiovascular: Cardiomegaly. No pericardial effusion. Coronary artery calcifications. Mild aortic atherosclerosis. No aortic  aneurysm. Mediastinum/Nodes: No mass or enlarged lymph nodes seen within the mediastinum or perihilar regions. Esophagus appears normal. Trachea and central bronchi are unremarkable. Lungs/Pleura: Emphysema appears stable, mild to moderate in degree. 10 mm pulmonary nodule within the left lower lobe is stable (series 4, image 121). 7 mm pulmonary nodule within the right lower lobe is stable (series 4, image 126). 4 mm pleural based pulmonary nodule within the lingula is stable (series 4, image 35). No new pulmonary nodules or masses identified.  No pleural effusion. Upper Abdomen: No acute findings. Atrophic kidneys are incompletely imaged. Musculoskeletal: Mild degenerative change within the lower thoracic spine. No acute or suspicious osseous finding. IMPRESSION: 1. Stable pulmonary nodules bilaterally. Largest pulmonary nodule is again seen within the left lower lobe, measuring 10 mm. Non-contrast chest CT at 18-24 months (from today's scan) is considered optional for low-risk patients, but is recommended for high-risk patients. This recommendation follows the consensus statement: Guidelines for Management of Incidental Pulmonary Nodules Detected on CT Images: From the Fleischner Society 2017; Radiology 2017; 284:228-243. 2. No acute findings. 3. Cardiomegaly. Aortic Atherosclerosis (ICD10-I70.0) and Emphysema (ICD10-J43.9). Electronically Signed   By: Franki Cabot M.D.   On: 09/24/2017 17:00   Ir Fluoro Guide Cv Line Right  Result Date: 09/16/2017 INDICATION: Poor venous access. In need of durable intravenous access for antibiotic administration. History of end-stage renal disease, currently on dialysis. As such, request made for placement of a tunneled single-lumen PICC line. EXAM: TUNNELED PICC LINE WITH ULTRASOUND AND FLUOROSCOPIC GUIDANCE MEDICATIONS: Patient is currently admitted to the hospital and receiving intravenous antibiotics. The antibiotic was given in an appropriate time interval prior to  skin puncture. ANESTHESIA/SEDATION: None FLUOROSCOPY TIME:  18 seconds (12 mGy) COMPLICATIONS: None immediate. PROCEDURE: Informed written consent was obtained from the patient after a discussion of the risks, benefits, and alternatives to treatment. Questions regarding the procedure were encouraged and answered. The right neck and chest were prepped with  chlorhexidine in a sterile fashion, and a sterile drape was applied covering the operative field. Maximum barrier sterile technique with sterile gowns and gloves were used for the procedure. A timeout was performed prior to the initiation of the procedure. After the overlying soft tissues were anesthetized, a small venotomy incision was created and a micropuncture kit was utilized to access the internal jugular vein. Real-time ultrasound guidance was utilized for vascular access including the acquisition of a permanent ultrasound image documenting patency of the accessed vessel. The microwire was utilized to measure appropriate catheter length. The micropuncture sheath was exchanged for a peel-away sheath over a guidewire. A 5 French single lumen tunneled PICC measuring 25 cm was tunneled in a retrograde fashion from the anterior chest wall to the venotomy incision. The catheter was then placed through the peel-away sheath with tip ultimately positioned at the superior caval-atrial junction. Final catheter positioning was confirmed and documented with a spot radiographic image. The catheter aspirates and flushes normally. The catheter was flushed with appropriate volume heparin dwells. The catheter exit site was secured with a 0-Prolene retention suture. The venotomy incision was closed with Dermabond and Steri-strips. Dressings were applied. The patient tolerated the procedure well without immediate post procedural complication. FINDINGS: After catheter placement, the tip lies within the superior cavoatrial junction. The catheter aspirates and flushes normally and  is ready for immediate use. IMPRESSION: Successful placement of 25 cm single lumen tunneled PICC catheter via the right internal jugular vein with tip terminating at the superior caval atrial junction. The catheter is ready for immediate use. Electronically Signed   By: Sandi Mariscal M.D.   On: 09/16/2017 17:32   Ir US Guide Vasc Access Right  Result Date: 09/16/2017 INDICATION: Poor venous access. In need of durable intravenous access for antibiotic administration. History of end-stage renal disease, currently on dialysis. As such, request made for placement of a tunneled single-lumen PICC line. EXAM: TUNNELED PICC LINE WITH ULTRASOUND AND FLUOROSCOPIC GUIDANCE MEDICATIONS: Patient is currently admitted to the hospital and receiving intravenous antibiotics. The antibiotic was given in an appropriate time interval prior to skin puncture. ANESTHESIA/SEDATION: None FLUOROSCOPY TIME:  18 seconds (12 mGy) COMPLICATIONS: None immediate. PROCEDURE: Informed written consent was obtained from the patient after a discussion of the risks, benefits, and alternatives to treatment. Questions regarding the procedure were encouraged and answered. The right neck and chest were prepped with chlorhexidine in a sterile fashion, and a sterile drape was applied covering the operative field. Maximum barrier sterile technique with sterile gowns and gloves were used for the procedure. A timeout was performed prior to the initiation of the procedure. After the overlying soft tissues were anesthetized, a small venotomy incision was created and a micropuncture kit was utilized to access the internal jugular vein. Real-time ultrasound guidance was utilized for vascular access including the acquisition of a permanent ultrasound image documenting patency of the accessed vessel. The microwire was utilized to measure appropriate catheter length. The micropuncture sheath was exchanged for a peel-away sheath over a guidewire. A 5 French single  lumen tunneled PICC measuring 25 cm was tunneled in a retrograde fashion from the anterior chest wall to the venotomy incision. The catheter was then placed through the peel-away sheath with tip ultimately positioned at the superior caval-atrial junction. Final catheter positioning was confirmed and documented with a spot radiographic image. The catheter aspirates and flushes normally. The catheter was flushed with appropriate volume heparin dwells. The catheter exit site was secured with a 0-Prolene retention  suture. The venotomy incision was closed with Dermabond and Steri-strips. Dressings were applied. The patient tolerated the procedure well without immediate post procedural complication. FINDINGS: After catheter placement, the tip lies within the superior cavoatrial junction. The catheter aspirates and flushes normally and is ready for immediate use. IMPRESSION: Successful placement of 25 cm single lumen tunneled PICC catheter via the right internal jugular vein with tip terminating at the superior caval atrial junction. The catheter is ready for immediate use. Electronically Signed   By: Sandi Mariscal M.D.   On: 09/16/2017 17:32    Assessment and plan- Patient is a 62 y.o. male with ESRD, chronic anemia presented to emergency room due to symptomatic anemia. Recent septic arthritis on IV daptomycin,  HIV undetectable viral load and CD4 count of 460 on 01/2017,   # Symptomatic anemia: agree with blood transfusion. Iron panel reviewed, consistent with anemia of chronic disease/CKD. Per patient he has been getting Epogen every other treatment along with his hemodialysis. It appears that since January, his hemoglobin has declined, not responding to Epogen.  Retic count increased. ? Peripheral destruction/blood loss.  LDH normal, Haptoglobin pending.  Check occult stool, check cbc with differential, TSH, light chain ratio, SPEP, blood smear, parvovirus B19 PCR.     Thank you for this kind referral and the  opportunity to participate in the care of this patient  Earlie Server, MD, PhD Hematology [place] [facility] Pager- [phone] 10/13/2017

## 2017-10-13 NOTE — Progress Notes (Signed)
HD Tx ended  

## 2017-10-13 NOTE — Progress Notes (Signed)
Notified Dr. Estanislado Pandy patient BP of 175/78. Per him is okay for RN to give hydralazine Schedule at 10000 now.

## 2017-10-13 NOTE — Progress Notes (Signed)
[facility] [place] NAME: Jeffrey [name]    MR#:  [id number]  DATE OF BIRTH:  [birth year]  SUBJECTIVE:  CHIEF COMPLAINT:   Chief Complaint  Patient presents with  . Abnormal Lab   sent with low hemoglobin. Status post 1 unit transfusion last night, still hemoglobin is low.  REVIEW OF SYSTEMS:  CONSTITUTIONAL: No fever, positive for fatigue or weakness.  EYES: No blurred or double vision.  EARS, NOSE, AND THROAT: No tinnitus or ear pain.  RESPIRATORY: No cough, shortness of breath, wheezing or hemoptysis.  CARDIOVASCULAR: No chest pain, orthopnea, edema.  GASTROINTESTINAL: No nausea, vomiting, diarrhea or abdominal pain.  GENITOURINARY: No dysuria, hematuria.  ENDOCRINE: No polyuria, nocturia,  HEMATOLOGY: No anemia, easy bruising or bleeding SKIN: No rash or lesion. MUSCULOSKELETAL: No joint pain or arthritis.   NEUROLOGIC: No tingling, numbness, weakness.  PSYCHIATRY: No anxiety or depression.   ROS  DRUG ALLERGIES:   Allergies  Allergen Reactions  . Lisinopril Anaphylaxis and Shortness Of Breath    Throat swelling  . Penicillins Other (See Comments)    Childhood allergy Has patient had a PCN reaction causing immediate rash, facial/tongue/throat swelling, SOB or lightheadedness with hypotension: Unk Has patient had a PCN reaction causing severe rash involving mucus membranes or skin necrosis: Unk Has patient had a PCN reaction that required hospitalization: Unk Has patient had a PCN reaction occurring within the last 10 years: No If all of the above answers are "NO", then may proceed with Cephalosporin use.     VITALS:  Blood pressure (!) 166/67, pulse 74, temperature 98 F (36.7 C), temperature source Oral, resp. rate (!) 23, height 6' (1.829 m), weight 89.1 kg (196 lb 6.9 oz), SpO2 99 %.  PHYSICAL EXAMINATION:  GENERAL:  62 y.o.-year-old patient lying in the bed with no acute distress.  EYES: Pupils equal, round, reactive  to light and accommodation. No scleral icterus. Extraocular muscles intact. Conjunctivae are pale HEENT: Head atraumatic, normocephalic. Oropharynx and nasopharynx clear.  NECK:  Supple, no jugular venous distention. No thyroid enlargement, no tenderness.  LUNGS: Normal breath sounds bilaterally, no wheezing, rales,rhonchi or crepitation. No use of accessory muscles of respiration.  CARDIOVASCULAR: S1, S2 normal. No murmurs, rubs, or gallops.  ABDOMEN: Soft, nontender, nondistended. Bowel sounds present. No organomegaly or mass.  EXTREMITIES: No pedal edema, cyanosis, or clubbing.  NEUROLOGIC: Cranial nerves II through XII are intact. Muscle strength 5/5 in all extremities. Sensation intact. Gait not checked.  PSYCHIATRIC: The patient is alert and oriented x 3.  SKIN: No obvious rash, lesion, or ulcer.   Physical Exam LABORATORY PANEL:   CBC Recent Labs  Lab 10/13/17 0417  WBC 8.9  HGB 6.9*  HCT 20.0*  PLT 227   ------------------------------------------------------------------------------------------------------------------  Chemistries  Recent Labs  Lab 10/07/17 2111  10/13/17 0417  NA 132*   < > 134*  K 3.3*   < > 3.7  CL 93*   < > 97*  CO2 25   < > 26  GLUCOSE 160*   < > 99  BUN 39*   < > 53*  CREATININE 9.76*   < > 10.29*  CALCIUM 9.2   < > 8.3*  AST 20  --   --   ALT 10*  --   --   ALKPHOS 93  --   --   BILITOT 0.6  --   --    < > = values in this interval not displayed.   ------------------------------------------------------------------------------------------------------------------  Cardiac Enzymes Recent Labs  Lab 10/12/17 1430  TROPONINI 0.04*   ------------------------------------------------------------------------------------------------------------------  RADIOLOGY:  No results found.  ASSESSMENT AND PLAN:   Active Problems:   Symptomatic anemia  * Symptomatic anemia   Transfuse 1 unit of PRBC, hematology consult.   Patient is compliant  to epogen with his hemodialysis.   His hemoglobin still low after 1 unit of transfusion, will give 1 more unit blood transfusion today.  Check LDH, haptoglobin, reticulocyte count.  * End-stage renal disease on hemodialysis   Nephrology consult.  * Recent joint infection   Continue daptomycin with dialysis.  His stop date was 24th of January.  * Hypertension   Continue home medications.  * HIV   Continue antiretroviral therapy.    All the records are reviewed and case discussed with Care Management/Social Workerr. Management plans discussed with the patient, family and they are in agreement.  CODE STATUS: Full code  TOTAL TIME TAKING CARE OF THIS PATIENT: 35 minutes.   His brother was present in the room during my visit.  POSSIBLE D/C IN 1-2 DAYS, DEPENDING ON CLINICAL CONDITION.   Vaughan Basta M.D on 10/13/2017   Between 7am to 6pm - Pager - [phone]  After 6pm go to [email or web address] - password [email or web address] [facility]  Office  [phone]  CC: Primary care physician; Velna Ochs, MD  Note: This dictation was prepared with Dragon dictation along with smaller phrase technology. Any transcriptional errors that result from this process are unintentional.

## 2017-10-13 NOTE — Progress Notes (Signed)
[facility]  ROUNDING NOTE   Subjective:  Patient well-known to [name]. He presented with a low hemoglobin of 6.8 yesterday. Thereafter we instructed him to come to the emergency department. Despite blood transfusion hemoglobin is only 6.9 today. Platelets are okay at 227,000. Recently he has been on daptomycin for treatment of septic arthritis. Hematology has been consulted. Patient was found to have negative stool guaiac.   Objective:  Vital signs in last 24 hours:  Temp:  [98.1 F (36.7 C)-99.1 F (37.3 C)] 98.1 F (36.7 C) (01/23 1022) Pulse Rate:  [66-83] 77 (01/23 1022) Resp:  [13-20] 19 (01/23 1022) BP: (154-193)/(65-89) 173/86 (01/23 1022) SpO2:  [68 %-100 %] 99 % (01/23 1022) Weight:  [88.5 kg (195 lb)-89.1 kg (196 lb 6.9 oz)] 89.1 kg (196 lb 6.9 oz) (01/23 1022)  Weight change:  Filed Weights   10/12/17 1418 10/13/17 1022  Weight: 88.5 kg (195 lb) 89.1 kg (196 lb 6.9 oz)    Intake/Output: I/O last 3 completed shifts: In: 115.2 [IV Piggyback:115.2] Out: -    Intake/Output this shift:  No intake/output data recorded.  Physical Exam: General: No acute distress  Head: Normocephalic, atraumatic. Moist oral mucosal membranes  Eyes: Anicteric  Neck: Supple, trachea midline  Lungs:  Clear to auscultation, normal effort  Heart: S1S2 no rubs  Abdomen:  Soft, nontender, bowel sounds present  Extremities:  peripheral edema.  Neurologic: Awake, alert, following commands  Skin: No lesions  Access: LUE AVF    Basic Metabolic Panel: Recent Labs  Lab 10/07/17 2111 10/12/17 1430 10/13/17 0417  NA 132* 131* 134*  K 3.3* 3.4* 3.7  CL 93* 95* 97*  CO2 25 24 26   GLUCOSE 160* 113* 99  BUN 39* 44* 53*  CREATININE 9.76* 9.30* 10.29*  CALCIUM 9.2 8.7* 8.3*    Liver Function Tests: Recent Labs  Lab 10/07/17 2111  AST 20  ALT 10*  ALKPHOS 93  BILITOT 0.6  PROT 7.1  ALBUMIN 3.3*   No results for input(s): LIPASE, AMYLASE in the last 168  hours. No results for input(s): AMMONIA in the last 168 hours.  CBC: Recent Labs  Lab 10/07/17 2111 10/12/17 1430 10/13/17 0417  WBC 8.4 10.4 8.9  HGB 7.4* 6.8* 6.9*  HCT 22.0* 20.1* 20.0*  MCV 94.4 95.1 93.1  PLT 197 278 227    Cardiac Enzymes: Recent Labs  Lab 10/12/17 1430  TROPONINI 0.04*    BNP: Invalid input(s): POCBNP  CBG: No results for input(s): GLUCAP in the last 168 hours.  Microbiology: Results for orders placed or performed during the hospital encounter of 09/23/17  MRSA PCR Screening     Status: None   Collection Time: 09/24/17  3:44 AM  Result Value Ref Range Status   MRSA by PCR NEGATIVE NEGATIVE Final    Comment:        The GeneXpert MRSA Assay (FDA approved for NASAL specimens only), is one component of a comprehensive MRSA colonization surveillance program. It is not intended to diagnose MRSA infection nor to guide or monitor treatment for MRSA infections.   Culture, blood (Routine X 2) w Reflex to ID Panel     Status: None   Collection Time: 09/25/17  9:23 AM  Result Value Ref Range Status   Specimen Description BLOOD RIGHT HAND  Final   Special Requests IN PEDIATRIC BOTTLE Blood Culture adequate volume  Final   Culture NO GROWTH 5 DAYS  Final   Report Status 09/30/2017 FINAL  Final  Culture, blood (Routine X 2) w Reflex to ID Panel     Status: None   Collection Time: 09/25/17  9:27 AM  Result Value Ref Range Status   Specimen Description BLOOD RIGHT ARM  Final   Special Requests IN PEDIATRIC BOTTLE Blood Culture adequate volume  Final   Culture NO GROWTH 5 DAYS  Final   Report Status 09/30/2017 FINAL  Final    Coagulation Studies: No results for input(s): LABPROT, INR in the last 72 hours.  Urinalysis: No results for input(s): COLORURINE, LABSPEC, PHURINE, GLUCOSEU, HGBUR, BILIRUBINUR, KETONESUR, PROTEINUR, UROBILINOGEN, NITRITE, LEUKOCYTESUR in the last 72 hours.  Invalid input(s): APPERANCEUR    Imaging: No results  found.   Medications:   . sodium chloride    . sodium chloride    . sodium chloride    . sodium chloride    . DAPTOmycin (CUBICIN)  IV Stopped (10/12/17 2214)   . aspirin  81 mg Oral Daily  . calcium acetate  667-2,001 mg Oral See admin instructions  . carvedilol  25 mg Oral BID WC  . [START ON 10/15/2017] cloNIDine  0.2 mg Transdermal Weekly  . doxazosin  4 mg Oral QHS  . heparin  5,000 Units Subcutaneous Q8H  . hydrALAZINE  100 mg Oral TID  . lamiVUDine  25 mg Oral QHS  . multivitamin  1 tablet Oral Daily  . raltegravir  400 mg Oral BID  . [START ON 10/16/2017] tenofovir  300 mg Oral Weekly   sodium chloride, sodium chloride, albuterol, ALPRAZolam, alteplase, docusate sodium, heparin, HYDROcodone-acetaminophen, lidocaine (PF), lidocaine-prilocaine, methocarbamol, pentafluoroprop-tetrafluoroeth, traZODone  Assessment/ Plan:  62 y.o. male of ESRD on home hemodialysis, hypertension, HIV, secondary hyperparathyroidism, anemia of chronic kidney disease, seizure disorder, recent septic arthritis who presents now with worsening anemia.  Home Hemo/[facility]  1.  ESRD on home hemodialysis.  Patient is on home hemodialysis.  We will plan for inpatient hemodialysis today.  Patient seen and evaluated during dialysis treatment and appears to be tolerating well.  2.  Anemia of chronic kidney disease.  He appears to have worsening anemia.  Recent infection may have led to decreased effectiveness of Epogen.  However he had recent surgery as well.  Agree with consulting hematology.  His guaiac was reported to be negative.  However if hemoglobin remains low may need to consider gastroneurology consultation as well.  3.  Hypertension.  Continue the patient on carvedilol, clonidine, doxazosin, and hydralazine.  4.  Secondary hyperparathyroidism.  Maintain the patient on current dosage of calcium acetate.  Follow-up serum phosphorus.   LOS: 1 Deloise Marchant 1/23/201912:19 PM

## 2017-10-14 ENCOUNTER — Other Ambulatory Visit: Payer: Self-pay | Admitting: Oncology

## 2017-10-14 ENCOUNTER — Telehealth: Payer: Self-pay | Admitting: *Deleted

## 2017-10-14 DIAGNOSIS — D649 Anemia, unspecified: Secondary | ICD-10-CM

## 2017-10-14 DIAGNOSIS — I509 Heart failure, unspecified: Secondary | ICD-10-CM | POA: Diagnosis not present

## 2017-10-14 DIAGNOSIS — I10 Essential (primary) hypertension: Secondary | ICD-10-CM

## 2017-10-14 DIAGNOSIS — N186 End stage renal disease: Secondary | ICD-10-CM | POA: Diagnosis not present

## 2017-10-14 DIAGNOSIS — I120 Hypertensive chronic kidney disease with stage 5 chronic kidney disease or end stage renal disease: Secondary | ICD-10-CM | POA: Diagnosis not present

## 2017-10-14 DIAGNOSIS — D631 Anemia in chronic kidney disease: Secondary | ICD-10-CM | POA: Diagnosis not present

## 2017-10-14 DIAGNOSIS — M00869 Arthritis due to other bacteria, unspecified knee: Secondary | ICD-10-CM

## 2017-10-14 DIAGNOSIS — Z992 Dependence on renal dialysis: Secondary | ICD-10-CM | POA: Diagnosis not present

## 2017-10-14 DIAGNOSIS — T82868A Thrombosis of vascular prosthetic devices, implants and grafts, initial encounter: Secondary | ICD-10-CM

## 2017-10-14 LAB — CBC WITH DIFFERENTIAL/PLATELET
BASOS ABS: 0.1 10*3/uL (ref 0–0.1)
Basophils Relative: 1 %
EOS ABS: 0.4 10*3/uL (ref 0–0.7)
Eosinophils Relative: 5 %
HCT: 22.6 % — ABNORMAL LOW (ref 40.0–52.0)
Hemoglobin: 7.8 g/dL — ABNORMAL LOW (ref 13.0–18.0)
Lymphocytes Relative: 13 %
Lymphs Abs: 1 10*3/uL (ref 1.0–3.6)
MCH: 31.8 pg (ref 26.0–34.0)
MCHC: 34.6 g/dL (ref 32.0–36.0)
MCV: 91.7 fL (ref 80.0–100.0)
MONO ABS: 0.9 10*3/uL (ref 0.2–1.0)
MONOS PCT: 11 %
Neutro Abs: 5.6 10*3/uL (ref 1.4–6.5)
Neutrophils Relative %: 70 %
PLATELETS: 239 10*3/uL (ref 150–440)
RBC: 2.47 MIL/uL — ABNORMAL LOW (ref 4.40–5.90)
RDW: 18.2 % — AB (ref 11.5–14.5)
WBC: 8 10*3/uL (ref 3.8–10.6)

## 2017-10-14 LAB — FOLATE: Folate: 17.3 ng/mL (ref >5.9)

## 2017-10-14 LAB — TYPE AND SCREEN
ABO/RH(D): O NEG
ANTIBODY SCREEN: NEGATIVE
Unit division: 0
Unit division: 0

## 2017-10-14 LAB — BPAM RBC
Blood Product Expiration Date: 201902062359
Blood Product Expiration Date: 201902062359
ISSUE DATE / TIME: 201901221733
ISSUE DATE / TIME: 201901231142
Unit Type and Rh: 9500
Unit Type and Rh: 9500

## 2017-10-14 LAB — HAPTOGLOBIN: Haptoglobin: 188 mg/dL (ref 34–200)

## 2017-10-14 LAB — PATHOLOGIST SMEAR REVIEW

## 2017-10-14 LAB — VITAMIN B12: VITAMIN B 12: 274 pg/mL (ref 180–914)

## 2017-10-14 LAB — TSH: TSH: 3.325 u[IU]/mL (ref 0.350–4.500)

## 2017-10-14 MED ORDER — ASPIRIN 81 MG PO CHEW: 81.0000 mg | CHEWABLE_TABLET | Freq: Every day | ORAL | 0 refills | Status: DC

## 2017-10-14 MED ORDER — MIDAZOLAM HCL 2 MG/2ML IJ SOLN
1.0000 mg | Freq: Once | INTRAMUSCULAR | Status: AC
  Administered 2017-10-14: 1 mg via INTRAVENOUS

## 2017-10-14 MED ORDER — MIDAZOLAM HCL 2 MG/2ML IJ SOLN
INTRAMUSCULAR | Status: AC
  Filled 2017-10-14: qty 2

## 2017-10-14 MED ORDER — NITROGLYCERIN 2 % TD OINT
0.5000 [in_us] | TOPICAL_OINTMENT | Freq: Four times a day (QID) | TRANSDERMAL | Status: DC
Start: 2017-10-14 — End: 2017-10-14
  Administered 2017-10-14: 0.5 [in_us] via TOPICAL
  Filled 2017-10-14: qty 1

## 2017-10-14 NOTE — Op Note (Signed)
  OPERATIVE NOTE   PROCEDURE: Removal of Hickman catheter  PRE-OPERATIVE DIAGNOSIS: Right knee infection  POST-OPERATIVE DIAGNOSIS: Same  SURGEON: Hortencia Pilar, M.D.  ANESTHESIA: Local anesthesia   ESTIMATED BLOOD LOSS: Minimal   SPECIMEN(S): Hickman catheter INDICATIONS:   Jeffrey [name] is a 62 y.o. y.o. male who presents with Hickman catheter is no longer needed.  The antibiotic therapy has been completed and the Hickman is no longer required. Patient is therefore undergoing removal of the Hickman. The risks and benefits of been reviewed all questions answered patient agrees to proceed with port removal   DESCRIPTION: After obtaining full informed written consent, the patient is brought to special procedures and positioned supine.  The patient was prepped and draped in the standard fashion appropriate time out is called.    After infiltrating 1% lidocaine with epinephrine into the soft tissues and skin surrounding the exit site is widened with an 11 blade scalpel.  The Hickman is slipped from the subcutaneous tissue and subsequently removed without difficulty otherwise intact.  Pressure is held at the base of the neck for 5 minutes.  The patient tolerated the procedure without changes  COMPLICATIONS: None  CONDITION: Good  Hortencia Pilar, M.D. [facility] Office: [phone]   10/14/2017, 7:46 PM

## 2017-10-14 NOTE — OR Nursing (Signed)
1 mg IV versed given by Dr Delana Meyer prior to removal of Hickman catheter

## 2017-10-14 NOTE — Discharge Summary (Signed)
[facility] [place] NAME: Jeffrey [name]    MR#:  [id number]  DATE OF BIRTH:  April 19, 1956  DATE OF ADMISSION:  10/12/2017 ADMITTING PHYSICIAN: Vaughan Basta, MD  DATE OF DISCHARGE: 10/14/2017  PRIMARY CARE PHYSICIAN: Velna Ochs, MD    ADMISSION DIAGNOSIS:  Anemia of chronic kidney failure, unspecified stage [N18.9, D63.1]  DISCHARGE DIAGNOSIS:  Active Problems:   Symptomatic anemia   Anemia of chronic kidney failure, unspecified stage   SECONDARY DIAGNOSIS:   Past Medical History:  Diagnosis Date  . Anemia   . Anxiety   . Arthritis   . Asthma    per pt hx  . End stage renal disease on home HD 07/10/2011   Started HD in September 2012 at [facility] with a tunneled HD catheter, now on home HD with NxtStage. Dialyzing through AVF L lower arm with buttonhole technique as of mid 2014. His brother does the HD treatments at home.  They are roommates for 23 years.  The brother works 3rd shift and gets off about 8am and then puts Mr Jeffrey [name] on HD in the morning after getting home. Most of the time he does HD about 4 times a week, for about 4 hours per treatment. Cause of ESRD was HTN according to patient. He says he let his health go and ending up with complications, and that he didn't like seeing doctors in those days.  He says he was diagnosed with severe HTN when he lived in [place] in his 70's.   . Hepatitis B carrier ([place])   . HIV infection ([place])   . Hypertension   . Hyperthyroidism    normal now  . Pneumonia several yrs ago  . Seizure ([place])   . Seizures ([place]) 06/02/2011   x 1 none since  . Thrombocytopenia [facility])     HOSPITAL COURSE:   *Symptomatic anemia Transfuse 1 unit of PRBC, hematology consult. Patient is compliant to epogenwith his hemodialysis.   His hemoglobin still low after 1 unit of transfusion, will give 1 more unit blood transfusion  Hb > 7 after that. ordered LDH, haptoglobin, reticulocyte  count.   Hematologist saw the pt, ordered SPEP, Parvovirus B 19 ab.   Advise to follow in office with hematology as he is feeling better.   Need to have colonoscopy as out pt, Dr. Holley Raring is going to arrange for that.  *End-stage renal disease on hemodialysis Nephrology consult.  *Recent joint infection Continue daptomycin with dialysis.  His stop date was 24th of January.  Advise to follow with his ID physician next week in [place].  *Hypertension Continue home medications.  *HIV Continue antiretroviral therapy.    DISCHARGE CONDITIONS:   stable  CONSULTS OBTAINED:  Treatment Team:  Anthonette Legato, MD Earlie Server, MD  DRUG ALLERGIES:   Allergies  Allergen Reactions  . Lisinopril Anaphylaxis and Shortness Of Breath    Throat swelling  . Penicillins Other (See Comments)    Childhood allergy Has patient had a PCN reaction causing immediate rash, facial/tongue/throat swelling, SOB or lightheadedness with hypotension: Unk Has patient had a PCN reaction causing severe rash involving mucus membranes or skin necrosis: Unk Has patient had a PCN reaction that required hospitalization: Unk Has patient had a PCN reaction occurring within the last 10 years: No If all of the above answers are "NO", then may proceed with Cephalosporin use.     DISCHARGE MEDICATIONS:   Allergies as of 10/14/2017  Reactions   Lisinopril Anaphylaxis, Shortness Of Breath   Throat swelling   Penicillins Other (See Comments)   Childhood allergy Has patient had a PCN reaction causing immediate rash, facial/tongue/throat swelling, SOB or lightheadedness with hypotension: Unk Has patient had a PCN reaction causing severe rash involving mucus membranes or skin necrosis: Unk Has patient had a PCN reaction that required hospitalization: Unk Has patient had a PCN reaction occurring within the last 10 years: No If all of the above answers are "NO", then may proceed with Cephalosporin  use.      Medication List    STOP taking these medications   aspirin 325 MG tablet Replaced by:  aspirin 81 MG chewable tablet   DAPTOmycin 762.5 mg in sodium chloride 0.9 % 100 mL   daptomycin IVPB Commonly known as:  CUBICIN     TAKE these medications   albuterol (2.5 MG/3ML) 0.083% nebulizer solution Commonly known as:  PROVENTIL Take 2.5 mg by nebulization every 6 (six) hours as needed for wheezing or shortness of breath.   ALPRAZolam 0.25 MG tablet Commonly known as:  XANAX Take 0.25 mg by mouth daily as needed for anxiety.   aspirin 81 MG chewable tablet Chew 1 tablet (81 mg total) by mouth daily. Start taking on:  10/15/2017 Replaces:  aspirin 325 MG tablet   calcium acetate 667 MG capsule Commonly known as:  PHOSLO Take 667-2,001 mg by mouth See admin instructions. 1,334-2,001 mg three times a day with each meal and 667-1,334 mg with each snack   carvedilol 25 MG tablet Commonly known as:  COREG Take 1 tablet (25 mg total) by mouth 2 (two) times daily with a meal.   cloNIDine 0.1 mg/24hr patch Commonly known as:  CATAPRES - Dosed in mg/24 hr UNWRAP AND APPLY 1 PATCH TO SKIN EVERY 7 DAYS What changed:  See the new instructions.   clotrimazole-betamethasone cream Commonly known as:  LOTRISONE Apply 1 application topically 2 (two) times daily.   doxazosin 4 MG tablet Commonly known as:  CARDURA Take 4 mg by mouth at bedtime.   epoetin alfa 2000 UNIT/ML injection Commonly known as:  EPOGEN,PROCRIT Inject 2,000 Units into the vein 3 (three) times a week.   ethyl chloride spray Apply 1 application topically daily as needed (HD). At HD   heparin 1000 UNIT/ML injection Inject 3,000 Units into the vein 4 (four) times a week.   hydrALAZINE 100 MG tablet Commonly known as:  APRESOLINE Take 1 tablet (100 mg total) by mouth 2 (two) times daily. What changed:  when to take this   HYDROcodone-acetaminophen 10-325 MG tablet Commonly known as:  NORCO Take 1-2  tablets by mouth every 6 (six) hours as needed for severe pain ((score 7 to 10)).   ISENTRESS 400 MG tablet Generic drug:  raltegravir TAKE 1 TABLET(400 MG) BY MOUTH TWICE DAILY   lamivudine 5 MG/ML solution Commonly known as:  EPIVIR HBV TAKE 5 ML BY MOUTH EVERY DAY What changed:    how to take this  when to take this  additional instructions   methocarbamol 500 MG tablet Commonly known as:  ROBAXIN Take 1 tablet (500 mg total) by mouth every 8 (eight) hours as needed for muscle spasms.   multivitamin Tabs tablet Take 1 tablet by mouth daily.   PROCTOZONE-HC 2.5 % rectal cream Generic drug:  hydrocortisone Place 1 application rectally 2 (two) times daily as needed.   tenofovir 300 MG tablet Commonly known as:  VIREAD TAKE 1 TABLET BY  MOUTH EVERY SATURDAY AFTER DIALYSIS   traZODone 50 MG tablet Commonly known as:  DESYREL Take 50 mg by mouth at bedtime as needed for sleep.   VOLTAREN 1 % Gel Generic drug:  diclofenac sodium APPLY 2 GRAMS EXTERNALLY TO THE AFFECTED AREA FOUR TIMES DAILY        DISCHARGE INSTRUCTIONS:    Follow with Your ID clinic. Follow with Hematology and nephrology clinic. Need to have colonoscopy as out pt.  If you experience worsening of your admission symptoms, develop shortness of breath, life threatening emergency, suicidal or homicidal thoughts you must seek medical attention immediately by calling 911 or calling your MD immediately  if symptoms less severe.  You Must read complete instructions/literature along with all the possible adverse reactions/side effects for all the Medicines you take and that have been prescribed to you. Take any new Medicines after you have completely understood and accept all the possible adverse reactions/side effects.   Please note  You were cared for by a hospitalist during your hospital stay. If you have any questions about your discharge medications or the care you received while you were in the  hospital after you are discharged, you can call the unit and asked to speak with the hospitalist on call if the hospitalist that took care of you is not available. Once you are discharged, your primary care physician will handle any further medical issues. Please note that NO REFILLS for any discharge medications will be authorized once you are discharged, as it is imperative that you return to your primary care physician (or establish a relationship with a primary care physician if you do not have one) for your aftercare needs so that they can reassess your need for medications and monitor your lab values.    Today   CHIEF COMPLAINT:   Chief Complaint  Patient presents with  . Abnormal Lab    HISTORY OF PRESENT ILLNESS:  Jeffrey [name]  is a 62 y.o. male with a known history of anemia, anxiety, asthma, end-stage renal disease on hemodialysis, hepatitis B carrier, HIV infection, hypertension, hypothyroidism, seizure- had right knee infection and on IV daptomycin with his hemodialysis for last 1 month.  He is also receiving antiretroviral therapy and hemodialysis plus injection epogen at home by his brother. He has a visiting nurse who sends the lab work and noted today to have anemia so Dr. Holley Raring called him to go to emergency room for getting admitted for blood transfusion and further workup. Patient complains of feeling generalized weakness, he denies any blood loss in his stool.   VITAL SIGNS:  Blood pressure (!) 192/83, pulse 79, temperature 98.8 F (37.1 C), temperature source Oral, resp. rate 20, height 6' (1.829 m), weight 89.1 kg (196 lb 6.9 oz), SpO2 99 %.  I/O:    Intake/Output Summary (Last 24 hours) at 10/14/2017 1026 Last data filed at 10/14/2017 0800 Gross per 24 hour  Intake 875 ml  Output 2751 ml  Net -1876 ml    PHYSICAL EXAMINATION:   GENERAL:  62 y.o.-year-old patient lying in the bed with no acute distress.  EYES: Pupils equal, round, reactive to light and  accommodation. No scleral icterus. Extraocular muscles intact. Conjunctivae are pale HEENT: Head atraumatic, normocephalic. Oropharynx and nasopharynx clear.  NECK:  Supple, no jugular venous distention. No thyroid enlargement, no tenderness.  LUNGS: Normal breath sounds bilaterally, no wheezing, rales,rhonchi or crepitation. No use of accessory muscles of respiration.  CARDIOVASCULAR: S1, S2 normal. No murmurs, rubs, or  gallops.  ABDOMEN: Soft, nontender, nondistended. Bowel sounds present. No organomegaly or mass.  EXTREMITIES: No pedal edema, cyanosis, or clubbing.  NEUROLOGIC: Cranial nerves II through XII are intact. Muscle strength 5/5 in all extremities. Sensation intact. Gait not checked.  PSYCHIATRIC: The patient is alert and oriented x 3.  SKIN: No obvious rash, lesion, or ulcer.     DATA REVIEW:   CBC Recent Labs  Lab 10/14/17 0425  WBC 8.0  HGB 7.8*  HCT 22.6*  PLT 239    Chemistries  Recent Labs  Lab 10/07/17 2111  10/13/17 0417  NA 132*   < > 134*  K 3.3*   < > 3.7  CL 93*   < > 97*  CO2 25   < > 26  GLUCOSE 160*   < > 99  BUN 39*   < > 53*  CREATININE 9.76*   < > 10.29*  CALCIUM 9.2   < > 8.3*  AST 20  --   --   ALT 10*  --   --   ALKPHOS 93  --   --   BILITOT 0.6  --   --    < > = values in this interval not displayed.    Cardiac Enzymes Recent Labs  Lab 10/12/17 1430  TROPONINI 0.04*    Microbiology Results  Results for orders placed or performed during the hospital encounter of 09/23/17  MRSA PCR Screening     Status: None   Collection Time: 09/24/17  3:44 AM  Result Value Ref Range Status   MRSA by PCR NEGATIVE NEGATIVE Final    Comment:        The GeneXpert MRSA Assay (FDA approved for NASAL specimens only), is one component of a comprehensive MRSA colonization surveillance program. It is not intended to diagnose MRSA infection nor to guide or monitor treatment for MRSA infections.   Culture, blood (Routine X 2) w Reflex to ID  Panel     Status: None   Collection Time: 09/25/17  9:23 AM  Result Value Ref Range Status   Specimen Description BLOOD RIGHT HAND  Final   Special Requests IN PEDIATRIC BOTTLE Blood Culture adequate volume  Final   Culture NO GROWTH 5 DAYS  Final   Report Status 09/30/2017 FINAL  Final  Culture, blood (Routine X 2) w Reflex to ID Panel     Status: None   Collection Time: 09/25/17  9:27 AM  Result Value Ref Range Status   Specimen Description BLOOD RIGHT ARM  Final   Special Requests IN PEDIATRIC BOTTLE Blood Culture adequate volume  Final   Culture NO GROWTH 5 DAYS  Final   Report Status 09/30/2017 FINAL  Final    RADIOLOGY:  No results found.  EKG:   Orders placed or performed during the hospital encounter of 10/12/17  . ED EKG  . ED EKG      Management plans discussed with the patient, family and they are in agreement.  CODE STATUS:     Code Status Orders  (From admission, onward)        Start     Ordered   10/12/17 1953  Full code  Continuous     10/12/17 1953    Code Status History    Date Active Date Inactive Code Status Order ID Comments User Context   09/23/2017 15:27 09/26/2017 15:50 Full Code [id number]  Maryellen Pile, MD Inpatient   09/15/2017 18:36 09/17/2017 13:51 Full Code [id number]  [name],  MD Inpatient   09/15/2017 18:36 09/15/2017 18:36 Full Code [id number]  [name], MD Inpatient   02/17/2017 10:17 02/17/2017 17:22 Full Code [id number]  Sherren Mocha, MD Inpatient   09/09/2016 07:47 09/12/2016 18:28 Full Code [id number]  Dellia Nims, MD ED   08/03/2016 22:37 08/08/2016 15:05 Full Code [id number]  Etta Quill, DO ED   04/08/2016 18:01 04/10/2016 21:01 Full Code [id number]  Juliet Rude, MD Inpatient   06/12/2013 08:26 06/14/2013 22:49 Full Code [id number]  Cresenciano Genre Inpatient    Advance Directive Documentation     Most Recent Value  Type of Advance Directive  Healthcare Power of Attorney, Living will  Pre-existing out of  facility DNR order (yellow form or pink MOST form)  No data  "MOST" Form in Place?  No data      TOTAL TIME TAKING CARE OF THIS PATIENT: 35 minutes.    Vaughan Basta M.D on 10/14/2017 at 10:26 AM  Between 7am to 6pm - Pager - [phone]  After 6pm go to [email or web address] - password [email or web address] [facility]  Office  [phone]  CC: Primary care physician; Velna Ochs, MD   Note: This dictation was prepared with Dragon dictation along with smaller phrase technology. Any transcriptional errors that result from this process are unintentional.

## 2017-10-14 NOTE — Care Management (Signed)
Patient to discharge home today.  [facility] HD liaison notified.  Patient was open with [facility] for IV infusion.  Dr. Anselm Jungling has confirmed with ID that patient has completed course of antibiotics and PICC line to be removed prior to discharge. Per nursing there is no other indication for home health.  Corene Cornea with [facility] notified that patient would no longer require services at discharge.  RNCM signing off.

## 2017-10-14 NOTE — Progress Notes (Signed)
Notified Dr. Marcille Blanco patient BP of 177/83. New order put in at this time. Will continue monitor.

## 2017-10-14 NOTE — Telephone Encounter (Signed)
Patient called from [facility], letting Dr Johnnye Sima know he was admitted on 1/22. He missedhis hospital follow up that day for Septic Arthritis.  He would like to know 1) when to reschedule that hospital follow up and 2) should he continue the antibiotics? He states he has a PICC and the nurses can remove it while he is hospitalized. Please advise. Landis Gandy, RN

## 2017-10-14 NOTE — Telephone Encounter (Signed)
[name],  Thank you so much for this note.  I spoke with the provider.  Best wishes Dr Johnnye Sima

## 2017-10-14 NOTE — Progress Notes (Signed)
Patient is concern about not getting his BP meds as he takes it at home. Dr. Marcille Blanco was notified and per him is okay for RN to give meds at 0730.

## 2017-10-14 NOTE — Consult Note (Signed)
[facility] Admission History & Physical  MRN : [id number]  Jeffrey [name] is a 62 y.o. (March 25, 1956) male who presents with chief complaint of  Chief Complaint  Patient presents with  . Abnormal Lab  .  History of Present Illness: I am asked to evaluate the patient by Dr Anselm Jungling. The patient was sent here because they have a finished their course of antibiotics.  He has been treated for a septic knee.  Patient denies pain or tenderness overlying the access.  The patient denies hand pain or finger pain consistent with steal syndrome.  No fevers or chills while on dialysis.   Current Facility-Administered Medications  Medication Dose Route Frequency Provider Last Rate Last Dose  . 0.9 %  sodium chloride infusion  10 mL/hr Intravenous Once Alfred Levins, [place], MD      . 0.9 %  sodium chloride infusion   Intravenous Once Vaughan Basta, MD      . albuterol (PROVENTIL) (2.5 MG/3ML) 0.083% nebulizer solution 2.5 mg  2.5 mg Nebulization Q6H PRN Vaughan Basta, MD      . ALPRAZolam Duanne Moron) tablet 0.25 mg  0.25 mg Oral Daily PRN Vaughan Basta, MD   0.25 mg at 10/14/17 1322  . aspirin chewable tablet 81 mg  81 mg Oral Daily Vaughan Basta, MD   81 mg at 10/14/17 0801  . calcium acetate (PHOSLO) capsule 667-2,001 mg  667-2,001 mg Oral See admin instructions Vaughan Basta, MD   667 mg at 10/13/17 2106  . carvedilol (COREG) tablet 25 mg  25 mg Oral BID WC Vaughan Basta, MD   25 mg at 10/14/17 0800  . [START ON 10/15/2017] cloNIDine (CATAPRES - Dosed in mg/24 hr) patch 0.2 mg  0.2 mg Transdermal Weekly Vaughan Basta, MD      . DAPTOmycin (CUBICIN) 760 mg in sodium chloride 0.9 % IVPB  760 mg Intravenous Q48H Vaughan Basta, MD   Stopped at 10/12/17 2214  . docusate sodium (COLACE) capsule 100 mg  100 mg Oral BID PRN Vaughan Basta, MD      . doxazosin (CARDURA) tablet 4 mg  4 mg Oral QHS Vaughan Basta, MD   4 mg at 10/13/17 2107  . heparin injection 5,000 Units  5,000 Units Subcutaneous Q8H Vaughan Basta, MD   5,000 Units at 10/14/17 0501  . hydrALAZINE (APRESOLINE) tablet 100 mg  100 mg Oral TID Vaughan Basta, MD   100 mg at 10/14/17 0800  . HYDROcodone-acetaminophen (NORCO) 10-325 MG per tablet 1-2 tablet  1-2 tablet Oral Q6H PRN Vaughan Basta, MD   2 tablet at 10/13/17 1658  . lamiVUDine (EPIVIR) 10 MG/ML solution 25 mg  25 mg Oral QHS Vaughan Basta, MD   25 mg at 10/13/17 2107  . methocarbamol (ROBAXIN) tablet 500 mg  500 mg Oral Q8H PRN Vaughan Basta, MD      . multivitamin (RENA-VIT) tablet 1 tablet  1 tablet Oral Daily Vaughan Basta, MD   1 tablet at 10/14/17 0801  . nitroGLYCERIN (NITROGLYN) 2 % ointment 0.5 inch  0.5 inch Topical Q6H Harrie Foreman, MD   0.5 inch at 10/14/17 0501  . raltegravir (ISENTRESS) tablet 400 mg  400 mg Oral BID Vaughan Basta, MD   400 mg at 10/14/17 0801  . [START ON 10/16/2017] tenofovir (VIREAD) tablet 300 mg  300 mg Oral Weekly Vaughan Basta, MD      . traZODone (DESYREL) tablet 50 mg  50 mg Oral QHS PRN Vaughan Basta, MD  Past Medical History:  Diagnosis Date  . Anemia   . Anxiety   . Arthritis   . Asthma    per pt hx  . End stage renal disease on home HD 07/10/2011   Started HD in September 2012 at [facility] with a tunneled HD catheter, now on home HD with NxtStage. Dialyzing through AVF L lower arm with buttonhole technique as of mid 2014. His brother does the HD treatments at home.  They are roommates for 23 years.  The brother works 3rd shift and gets off about 8am and then puts Mr Jefferys on HD in the morning after getting home. Most of the time he does HD about 4 times a week, for about 4 hours per treatment. Cause of ESRD was HTN according to patient. He says he let his health go and ending up with complications, and that he didn't like seeing doctors  in those days.  He says he was diagnosed with severe HTN when he lived in [place] in his 28's.   . Hepatitis B carrier ([place])   . HIV infection ([place])   . Hypertension   . Hyperthyroidism    normal now  . Pneumonia several yrs ago  . Seizure ([place])   . Seizures ([place]) 06/02/2011   x 1 none since  . Thrombocytopenia ([place])     Past Surgical History:  Procedure Laterality Date  . AV FISTULA PLACEMENT  06/02/11   Left radiocephalic AVF  . COLONOSCOPY    . fistulaogram     x 2 last 2 years  . IR FLUORO GUIDE CV LINE RIGHT  09/16/2017  . IR US GUIDE VASC ACCESS RIGHT  09/16/2017  . IRRIGATION AND DEBRIDEMENT KNEE Left 09/15/2017   Procedure: IRRIGATION AND DEBRIDEMENT KNEE; arthroscopic clean out;  Surgeon: [name], MD;  Location: [facility];  Service: Orthopedics;  Laterality: Left;  . OTHER SURGICAL HISTORY     removal temporary HD catheter   . REVISON OF ARTERIOVENOUS FISTULA Left 10/10/2015   Procedure: REVISON OF LEFT RADIOCEPHALIC ARTERIOVENOUS FISTULA;  Surgeon: Angelia Mould, MD;  Location: [facility];  Service: Vascular;  Laterality: Left;  . REVISON OF ARTERIOVENOUS FISTULA Left 02/07/2016   Procedure: REPAIR OF PSEUDO-ANEUREYSM OF LEFT ARM  ARTERIOVENOUS FISTULA;  Surgeon: Angelia Mould, MD;  Location: [place];  Service: Vascular;  Laterality: Left;  . REVISON OF ARTERIOVENOUS FISTULA Left 05/08/2992   Procedure: PLICATION OF LEFT ARM RADIOCEPHALIC ARTERIOVENOUS FISTULA PSEUDOANEURYSM;  Surgeon: Angelia Mould, MD;  Location: [facility];  Service: Vascular;  Laterality: Left;  . RIGHT/LEFT HEART CATH AND CORONARY ANGIOGRAPHY N/A 02/17/2017   Procedure: Right/Left Heart Cath and Coronary Angiography;  Surgeon: Sherren Mocha, MD;  Location: [facility];  Service: Cardiovascular;  Laterality: N/A;  . TEE WITHOUT CARDIOVERSION N/A 09/11/2016   Procedure: TRANSESOPHAGEAL ECHOCARDIOGRAM (TEE);  Surgeon: Dorothy Spark, MD;  Location: [facility];  Service:  Cardiovascular;  Laterality: N/A;    Social History Social History   Tobacco Use  . Smoking status: Former Smoker    Years: 10.00    Types: Cigarettes    Last attempt to quit: 08/08/2015    Years since quitting: 2.1  . Smokeless tobacco: Never Used  . Tobacco comment: casual smoking for 10 years  Substance Use Topics  . Alcohol use: No    Alcohol/week: 0.0 oz    Comment: quit 2004  . Drug use: No    Comment: uds (+) cocaine in 08/2011 but pt states  was taking sudafed at the time    Family History Family History  Problem Relation Age of Onset  . Hypertension Mother   . Diabetes Mother   . Cancer Father     No family history of bleeding or clotting disorders, autoimmune disease or porphyria  Allergies  Allergen Reactions  . Lisinopril Anaphylaxis and Shortness Of Breath    Throat swelling  . Penicillins Other (See Comments)    Childhood allergy Has patient had a PCN reaction causing immediate rash, facial/tongue/throat swelling, SOB or lightheadedness with hypotension: Unk Has patient had a PCN reaction causing severe rash involving mucus membranes or skin necrosis: Unk Has patient had a PCN reaction that required hospitalization: Unk Has patient had a PCN reaction occurring within the last 10 years: No If all of the above answers are "NO", then may proceed with Cephalosporin use.      REVIEW OF SYSTEMS (Negative unless checked)  Constitutional: _0 Weight loss  _1 Fever  _2 Chills Cardiac: _3 Chest pain   _4 Chest pressure   _5 Palpitations   _6 Shortness of breath when laying flat   _7 Shortness of breath at rest   _8 Shortness of breath with exertion. Vascular:  _9 Pain in legs with walking   _10 Pain in legs at rest   _11 Pain in legs when laying flat   _12 Claudication   _13 Pain in feet when walking  _14 Pain in feet at rest  _15 Pain in feet when laying flat   _16 History of DVT   _17 Phlebitis   _18 Swelling in legs   _19 Varicose veins   _20 Non-healing ulcers Pulmonary:   _21 Uses home oxygen    _22 Productive cough   _23 Hemoptysis   _24 Wheeze  _25 COPD   _26 Asthma Neurologic:  _27 Dizziness  _28 Blackouts   _29 Seizures   _30 History of stroke   _31 History of TIA  _32 Aphasia   _33 Temporary blindness   _34 Dysphagia   _35 Weakness or numbness in arms   _36 Weakness or numbness in legs Musculoskeletal:  _37 Arthritis   _38 Joint swelling   _39 Joint pain   _40 Low back pain Hematologic:  _41 Easy bruising  _42 Easy bleeding   _43 Hypercoagulable state   _44 Anemic  _45 Hepatitis Gastrointestinal:  _46 Blood in stool   _47 Vomiting blood  _48 Gastroesophageal reflux/heartburn   _49 Difficulty swallowing. Genitourinary:  _50 Chronic kidney disease   _51 Difficult urination  _52 Frequent urination  _53 Burning with urination   _54 Blood in urine Skin:  _55 Rashes   _56 Ulcers   _57 Wounds Psychological:  _58 History of anxiety   _59  History of major depression.  Physical Examination  Vitals:   10/13/17 1517 10/13/17 2053 10/14/17 0422 10/14/17 0656  BP: (!) 160/73 (!) 155/72 (!) 177/83 (!) 192/83  Pulse: 81 84 78 79  Resp: _60 Temp: 98.7 F (37.1 C) 98.1 F (36.7 C) 98.8 F (37.1 C)   TempSrc: Oral Oral Oral   SpO2: 100% 100% 99%   Weight:      Height:       Body mass index is 26.64 kg/m. Gen: WD/WN, NAD Head: [place]/AT, No temporalis wasting. Prominent temp pulse not noted. Ear/Nose/Throat: Hearing grossly intact, nares w/o erythema or drainage, oropharynx w/o Erythema/Exudate,  Eyes: Conjunctiva clear, sclera non-icteric Neck: Trachea midline.  No JVD.  Pulmonary:  Good air movement, respirations not labored, no use of accessory muscles.  Cardiac: RRR, normal S1, S2. Vascular:  Right IJ Hickman cath CD&I Vessel Right Left  Radial Palpable Palpable  Ulnar Not Palpable Not Palpable  Brachial Palpable Palpable  Carotid Palpable, without bruit Palpable, without bruit  Gastrointestinal: soft, non-tender/non-distended. No guarding/reflex.  Musculoskeletal: M/S  5/5 throughout.  Extremities without ischemic changes.  No deformity or  atrophy.  Neurologic: Sensation grossly intact in extremities.  Symmetrical.  Speech is fluent. Motor exam as listed above. Psychiatric: Judgment intact, Mood & affect appropriate for pt's clinical situation. Dermatologic: No rashes or ulcers noted.  No cellulitis or open wounds. Lymph : No Cervical, Axillary, or Inguinal lymphadenopathy.   CBC Lab Results  Component Value Date   WBC 8.0 10/14/2017   HGB 7.8 (L) 10/14/2017   HCT 22.6 (L) 10/14/2017   MCV 91.7 10/14/2017   PLT 239 10/14/2017    BMET    Component Value Date/Time   NA 134 (L) 10/13/2017 0417   NA 136 11/18/2016 1036   K 3.7 10/13/2017 0417   K 3.3 (L) 11/18/2016 1036   CL 97 (L) 10/13/2017 0417   CO2 26 10/13/2017 0417   CO2 29 11/18/2016 1036   GLUCOSE 99 10/13/2017 0417   GLUCOSE 95 11/18/2016 1036   BUN 53 (H) 10/13/2017 0417   BUN 25.7 11/18/2016 1036   CREATININE 10.29 (H) 10/13/2017 0417   CREATININE 13.81 (H) 02/10/2017 1619   CREATININE 6.6 (HH) 11/18/2016 1036   CALCIUM 8.3 (L) 10/13/2017 0417   CALCIUM 9.5 11/18/2016 1036   GFRNONAA 5 (L) 10/13/2017 0417   GFRNONAA 3 (L) 02/10/2017 1619   GFRAA 5 (L) 10/13/2017 0417   GFRAA <4 (L) 02/10/2017 1619   Estimated Creatinine Clearance: 8.1 mL/min (A) (by C-G formula based on SCr of 10.29 mg/dL (H)).  COAG Lab Results  Component Value Date   INR 1.12 09/23/2017   INR 1.30 09/17/2017   INR 1.19 09/16/2017    Radiology Ct Chest Wo Contrast  Result Date: 09/24/2017 CLINICAL DATA:  History of end-stage renal disease on home dialysis, HIV, hepatitis-B, chronic anemia, pulmonary nodule. Follow-up for lung nodule. EXAM: CT CHEST WITHOUT CONTRAST TECHNIQUE: Multidetector CT imaging of the chest was performed following the standard protocol without IV contrast. COMPARISON:  Chest CT dated 03/30/2017. FINDINGS: Cardiovascular: Cardiomegaly. No pericardial effusion. Coronary artery calcifications. Mild aortic atherosclerosis. No aortic aneurysm.  Mediastinum/Nodes: No mass or enlarged lymph nodes seen within the mediastinum or perihilar regions. Esophagus appears normal. Trachea and central bronchi are unremarkable. Lungs/Pleura: Emphysema appears stable, mild to moderate in degree. 10 mm pulmonary nodule within the left lower lobe is stable (series 4, image 121). 7 mm pulmonary nodule within the right lower lobe is stable (series 4, image 126). 4 mm pleural based pulmonary nodule within the lingula is stable (series 4, image 35). No new pulmonary nodules or masses identified.  No pleural effusion. Upper Abdomen: No acute findings. Atrophic kidneys are incompletely imaged. Musculoskeletal: Mild degenerative change within the lower thoracic spine. No acute or suspicious osseous finding. IMPRESSION: 1. Stable pulmonary nodules bilaterally. Largest pulmonary nodule is again seen within the left lower lobe, measuring 10 mm. Non-contrast chest CT at 18-24 months (from today's scan) is considered optional for low-risk patients, but is recommended for high-risk patients. This recommendation follows the consensus statement: Guidelines for Management of Incidental Pulmonary Nodules Detected on CT Images: From the Fleischner Society 2017; Radiology 2017; 284:228-243. 2. No acute findings. 3. Cardiomegaly. Aortic Atherosclerosis (ICD10-I70.0) and Emphysema (ICD10-J43.9). Electronically Signed   By: Franki Cabot M.D.   On: 09/24/2017 17:00   Ir Fluoro Guide Cv Line Right  Result Date: 09/16/2017 INDICATION: Poor venous access. In need of durable intravenous access for antibiotic administration. History of end-stage renal disease, currently on dialysis. As such, request made for  placement of a tunneled single-lumen PICC line. EXAM: TUNNELED PICC LINE WITH ULTRASOUND AND FLUOROSCOPIC GUIDANCE MEDICATIONS: Patient is currently admitted to the hospital and receiving intravenous antibiotics. The antibiotic was given in an appropriate time interval prior to skin  puncture. ANESTHESIA/SEDATION: None FLUOROSCOPY TIME:  18 seconds (12 mGy) COMPLICATIONS: None immediate. PROCEDURE: Informed written consent was obtained from the patient after a discussion of the risks, benefits, and alternatives to treatment. Questions regarding the procedure were encouraged and answered. The right neck and chest were prepped with chlorhexidine in a sterile fashion, and a sterile drape was applied covering the operative field. Maximum barrier sterile technique with sterile gowns and gloves were used for the procedure. A timeout was performed prior to the initiation of the procedure. After the overlying soft tissues were anesthetized, a small venotomy incision was created and a micropuncture kit was utilized to access the internal jugular vein. Real-time ultrasound guidance was utilized for vascular access including the acquisition of a permanent ultrasound image documenting patency of the accessed vessel. The microwire was utilized to measure appropriate catheter length. The micropuncture sheath was exchanged for a peel-away sheath over a guidewire. A 5 French single lumen tunneled PICC measuring 25 cm was tunneled in a retrograde fashion from the anterior chest wall to the venotomy incision. The catheter was then placed through the peel-away sheath with tip ultimately positioned at the superior caval-atrial junction. Final catheter positioning was confirmed and documented with a spot radiographic image. The catheter aspirates and flushes normally. The catheter was flushed with appropriate volume heparin dwells. The catheter exit site was secured with a 0-Prolene retention suture. The venotomy incision was closed with Dermabond and Steri-strips. Dressings were applied. The patient tolerated the procedure well without immediate post procedural complication. FINDINGS: After catheter placement, the tip lies within the superior cavoatrial junction. The catheter aspirates and flushes normally and is  ready for immediate use. IMPRESSION: Successful placement of 25 cm single lumen tunneled PICC catheter via the right internal jugular vein with tip terminating at the superior caval atrial junction. The catheter is ready for immediate use. Electronically Signed   By: Sandi Mariscal M.D.   On: 09/16/2017 17:32   Ir US Guide Vasc Access Right  Result Date: 09/16/2017 INDICATION: Poor venous access. In need of durable intravenous access for antibiotic administration. History of end-stage renal disease, currently on dialysis. As such, request made for placement of a tunneled single-lumen PICC line. EXAM: TUNNELED PICC LINE WITH ULTRASOUND AND FLUOROSCOPIC GUIDANCE MEDICATIONS: Patient is currently admitted to the hospital and receiving intravenous antibiotics. The antibiotic was given in an appropriate time interval prior to skin puncture. ANESTHESIA/SEDATION: None FLUOROSCOPY TIME:  18 seconds (12 mGy) COMPLICATIONS: None immediate. PROCEDURE: Informed written consent was obtained from the patient after a discussion of the risks, benefits, and alternatives to treatment. Questions regarding the procedure were encouraged and answered. The right neck and chest were prepped with chlorhexidine in a sterile fashion, and a sterile drape was applied covering the operative field. Maximum barrier sterile technique with sterile gowns and gloves were used for the procedure. A timeout was performed prior to the initiation of the procedure. After the overlying soft tissues were anesthetized, a small venotomy incision was created and a micropuncture kit was utilized to access the internal jugular vein. Real-time ultrasound guidance was utilized for vascular access including the acquisition of a permanent ultrasound image documenting patency of the accessed vessel. The microwire was utilized to measure appropriate catheter length. The micropuncture sheath  was exchanged for a peel-away sheath over a guidewire. A 5 French single lumen  tunneled PICC measuring 25 cm was tunneled in a retrograde fashion from the anterior chest wall to the venotomy incision. The catheter was then placed through the peel-away sheath with tip ultimately positioned at the superior caval-atrial junction. Final catheter positioning was confirmed and documented with a spot radiographic image. The catheter aspirates and flushes normally. The catheter was flushed with appropriate volume heparin dwells. The catheter exit site was secured with a 0-Prolene retention suture. The venotomy incision was closed with Dermabond and Steri-strips. Dressings were applied. The patient tolerated the procedure well without immediate post procedural complication. FINDINGS: After catheter placement, the tip lies within the superior cavoatrial junction. The catheter aspirates and flushes normally and is ready for immediate use. IMPRESSION: Successful placement of 25 cm single lumen tunneled PICC catheter via the right internal jugular vein with tip terminating at the superior caval atrial junction. The catheter is ready for immediate use. Electronically Signed   By: Sandi Mariscal M.D.   On: 09/16/2017 17:32    Assessment/Plan 1.  Complication dialysis device with thrombosis AV access:  Patient's antibiotic course has been completed and he no longer needs his catheter.  Attempts on the floor to remove the catheter were unsuccessful and therefore I am asked to evaluate.  Patient appears to have a Hickman catheter with a cuff and I will plan for removal under local anesthesia.  The risks and benefits of been reviewed all questions answered patient agrees to proceed with catheter removal 2.  End-stage renal disease requiring hemodialysis:  Patient will continue dialysis therapy without further interruption if a successful intervention is not achieved then a tunneled catheter will be placed. Dialysis has already been arranged. 3.  Hypertension:  Patient will continue medical management;  nephrology is following no changes in oral medications. 4.  Seizure disorder: Continue antiepileptic medications as ordered no changes at this time.   Hortencia Pilar, MD  10/14/2017 2:37 PM

## 2017-10-14 NOTE — Care Management Important Message (Signed)
Important Message  Patient Details  Name: Jeffrey [name] MRN: [id number] Date of Birth: 30-Jan-1956   Medicare Important Message Given:  N/A - LOS <3 / Initial given by admissions    Beverly Sessions, RN 10/14/2017, 11:10 AM

## 2017-10-14 NOTE — Progress Notes (Signed)
[facility]  ROUNDING NOTE   Subjective:  Patient had HD yesterday. Tolerated well. Hgb is up post transfusion.    Objective:  Vital signs in last 24 hours:  Temp:  [98.1 F (36.7 C)-98.8 F (37.1 C)] 98.8 F (37.1 C) (01/24 0422) Pulse Rate:  [66-84] 79 (01/24 0656) Resp:  [12-22] 20 (01/24 0422) BP: (155-192)/(68-92) 192/83 (01/24 0656) SpO2:  [98 %-100 %] 99 % (01/24 0422)  Weight change: 0.649 kg (1 lb 6.9 oz) Filed Weights   10/12/17 1418 10/13/17 1022  Weight: 88.5 kg (195 lb) 89.1 kg (196 lb 6.9 oz)    Intake/Output: I/O last 3 completed shifts: In: 940.2 [I.V.:100; Blood:725; IV Piggyback:115.2] Out: 2751 [Other:2751]   Intake/Output this shift:  Total I/O In: 50 [P.O.:50] Out: -   Physical Exam: General: No acute distress  Head: Normocephalic, atraumatic. Moist oral mucosal membranes  Eyes: Anicteric  Neck: Supple, trachea midline  Lungs:  Clear to auscultation, normal effort  Heart: S1S2 no rubs  Abdomen:  Soft, nontender, bowel sounds present  Extremities:  peripheral edema.  Neurologic: Awake, alert, following commands  Skin: No lesions  Access: LUE AVF    Basic Metabolic Panel: Recent Labs  Lab 10/07/17 2111 10/12/17 1430 10/13/17 0417  NA 132* 131* 134*  K 3.3* 3.4* 3.7  CL 93* 95* 97*  CO2 25 24 26   GLUCOSE 160* 113* 99  BUN 39* 44* 53*  CREATININE 9.76* 9.30* 10.29*  CALCIUM 9.2 8.7* 8.3*    Liver Function Tests: Recent Labs  Lab 10/07/17 2111  AST 20  ALT 10*  ALKPHOS 93  BILITOT 0.6  PROT 7.1  ALBUMIN 3.3*   No results for input(s): LIPASE, AMYLASE in the last 168 hours. No results for input(s): AMMONIA in the last 168 hours.  CBC: Recent Labs  Lab 10/07/17 2111 10/12/17 1430 10/13/17 0417 10/13/17 1737 10/14/17 0425  WBC 8.4 10.4 8.9  --  8.0  NEUTROABS  --   --   --   --  5.6  HGB 7.4* 6.8* 6.9* 7.9* 7.8*  HCT 22.0* 20.1* 20.0* 22.9* 22.6*  MCV 94.4 95.1 93.1  --  91.7  PLT 197 278 227  --   239    Cardiac Enzymes: Recent Labs  Lab 10/12/17 1430  TROPONINI 0.04*    BNP: Invalid input(s): POCBNP  CBG: No results for input(s): GLUCAP in the last 168 hours.  Microbiology: Results for orders placed or performed during the hospital encounter of 09/23/17  MRSA PCR Screening     Status: None   Collection Time: 09/24/17  3:44 AM  Result Value Ref Range Status   MRSA by PCR NEGATIVE NEGATIVE Final    Comment:        The GeneXpert MRSA Assay (FDA approved for NASAL specimens only), is one component of a comprehensive MRSA colonization surveillance program. It is not intended to diagnose MRSA infection nor to guide or monitor treatment for MRSA infections.   Culture, blood (Routine X 2) w Reflex to ID Panel     Status: None   Collection Time: 09/25/17  9:23 AM  Result Value Ref Range Status   Specimen Description BLOOD RIGHT HAND  Final   Special Requests IN PEDIATRIC BOTTLE Blood Culture adequate volume  Final   Culture NO GROWTH 5 DAYS  Final   Report Status 09/30/2017 FINAL  Final  Culture, blood (Routine X 2) w Reflex to ID Panel     Status: None   Collection Time:  09/25/17  9:27 AM  Result Value Ref Range Status   Specimen Description BLOOD RIGHT ARM  Final   Special Requests IN PEDIATRIC BOTTLE Blood Culture adequate volume  Final   Culture NO GROWTH 5 DAYS  Final   Report Status 09/30/2017 FINAL  Final    Coagulation Studies: No results for input(s): LABPROT, INR in the last 72 hours.  Urinalysis: No results for input(s): COLORURINE, LABSPEC, PHURINE, GLUCOSEU, HGBUR, BILIRUBINUR, KETONESUR, PROTEINUR, UROBILINOGEN, NITRITE, LEUKOCYTESUR in the last 72 hours.  Invalid input(s): APPERANCEUR    Imaging: No results found.   Medications:   . sodium chloride    . sodium chloride    . DAPTOmycin (CUBICIN)  IV Stopped (10/12/17 2214)   . aspirin  81 mg Oral Daily  . calcium acetate  667-2,001 mg Oral See admin instructions  . carvedilol  25 mg  Oral BID WC  . [START ON 10/15/2017] cloNIDine  0.2 mg Transdermal Weekly  . doxazosin  4 mg Oral QHS  . heparin  5,000 Units Subcutaneous Q8H  . hydrALAZINE  100 mg Oral TID  . lamiVUDine  25 mg Oral QHS  . multivitamin  1 tablet Oral Daily  . nitroGLYCERIN  0.5 inch Topical Q6H  . raltegravir  400 mg Oral BID  . [START ON 10/16/2017] tenofovir  300 mg Oral Weekly   albuterol, ALPRAZolam, docusate sodium, HYDROcodone-acetaminophen, methocarbamol, traZODone  Assessment/ Plan:  62 y.o. male of ESRD on home hemodialysis, hypertension, HIV, secondary hyperparathyroidism, anemia of chronic kidney disease, seizure disorder, recent septic arthritis who presents now with worsening anemia.  Home Hemo/[facility]  1.  ESRD on home hemodialysis.  Patient is on home hemodialysis.   - Pt will resume home hemodialysis tonight.    2.  Anemia of chronic kidney disease.  He appears to have worsening anemia.  Recent infection may have led to decreased effectiveness of Epogen.  However he had recent surgery as well.   -appreciate hematology input, we will consider and refer pt for outpt colonoscopy and otherwise pt will continue to follow with hematology.   3.  Hypertension.  Continue the patient on carvedilol, clonidine, doxazosin, and hydralazine.  4.  Secondary hyperparathyroidism.  Check phosphorus as an outpt.    LOS: 2 Jeffrey [name] 1/24/201912:35 PM

## 2017-10-14 NOTE — Progress Notes (Signed)
I have paged and spoke to patient's infectious disease specialist doctor at [facility].( Dr.Hatcher) He confirms that patient finished his IV daptomycin therapy and does not need any more IV therapy. He suggested to have a patient's catheter for IV therapy before he is discharged home. I have consulted IR to remove the catheter.

## 2017-10-15 ENCOUNTER — Other Ambulatory Visit: Payer: Self-pay | Admitting: Pharmacist

## 2017-10-15 DIAGNOSIS — Z992 Dependence on renal dialysis: Secondary | ICD-10-CM | POA: Diagnosis not present

## 2017-10-15 DIAGNOSIS — I120 Hypertensive chronic kidney disease with stage 5 chronic kidney disease or end stage renal disease: Secondary | ICD-10-CM | POA: Diagnosis not present

## 2017-10-15 DIAGNOSIS — N186 End stage renal disease: Secondary | ICD-10-CM | POA: Diagnosis not present

## 2017-10-15 DIAGNOSIS — I509 Heart failure, unspecified: Secondary | ICD-10-CM | POA: Diagnosis not present

## 2017-10-15 DIAGNOSIS — D631 Anemia in chronic kidney disease: Secondary | ICD-10-CM | POA: Diagnosis not present

## 2017-10-15 LAB — PROTEIN ELECTROPHORESIS, SERUM
A/G Ratio: 1 (ref 0.7–1.7)
ALBUMIN ELP: 3.1 g/dL (ref 2.9–4.4)
ALPHA-1-GLOBULIN: 0.3 g/dL (ref 0.0–0.4)
Alpha-2-Globulin: 0.7 g/dL (ref 0.4–1.0)
BETA GLOBULIN: 0.7 g/dL (ref 0.7–1.3)
GAMMA GLOBULIN: 1.4 g/dL (ref 0.4–1.8)
Globulin, Total: 3 g/dL (ref 2.2–3.9)
Total Protein ELP: 6.1 g/dL (ref 6.0–8.5)

## 2017-10-15 LAB — KAPPA/LAMBDA LIGHT CHAINS
Kappa free light chain: 253 mg/L — ABNORMAL HIGH (ref 3.3–19.4)
Kappa, lambda light chain ratio: 1.87 — ABNORMAL HIGH (ref 0.26–1.65)
Lambda free light chains: 135.3 mg/L — ABNORMAL HIGH (ref 5.7–26.3)

## 2017-10-18 DIAGNOSIS — D631 Anemia in chronic kidney disease: Secondary | ICD-10-CM | POA: Diagnosis not present

## 2017-10-18 DIAGNOSIS — Z992 Dependence on renal dialysis: Secondary | ICD-10-CM | POA: Diagnosis not present

## 2017-10-18 DIAGNOSIS — N186 End stage renal disease: Secondary | ICD-10-CM | POA: Diagnosis not present

## 2017-10-18 DIAGNOSIS — I120 Hypertensive chronic kidney disease with stage 5 chronic kidney disease or end stage renal disease: Secondary | ICD-10-CM | POA: Diagnosis not present

## 2017-10-18 DIAGNOSIS — I509 Heart failure, unspecified: Secondary | ICD-10-CM | POA: Diagnosis not present

## 2017-10-18 LAB — HUMAN PARVOVIRUS DNA DETECTION BY PCR: Parvovirus B19, PCR: NEGATIVE

## 2017-10-19 DIAGNOSIS — D631 Anemia in chronic kidney disease: Secondary | ICD-10-CM | POA: Diagnosis not present

## 2017-10-19 DIAGNOSIS — I509 Heart failure, unspecified: Secondary | ICD-10-CM | POA: Diagnosis not present

## 2017-10-19 DIAGNOSIS — I120 Hypertensive chronic kidney disease with stage 5 chronic kidney disease or end stage renal disease: Secondary | ICD-10-CM | POA: Diagnosis not present

## 2017-10-19 DIAGNOSIS — Z992 Dependence on renal dialysis: Secondary | ICD-10-CM | POA: Diagnosis not present

## 2017-10-19 DIAGNOSIS — N186 End stage renal disease: Secondary | ICD-10-CM | POA: Diagnosis not present

## 2017-10-19 MED FILL — ETHYL CHLORIDE SPRAY: 25 days supply | Qty: 104 | Fill #1

## 2017-10-20 ENCOUNTER — Inpatient Hospital Stay: Payer: Medicare Other | Admitting: Oncology

## 2017-10-20 ENCOUNTER — Inpatient Hospital Stay: Payer: Medicare Other | Attending: Oncology

## 2017-10-20 DIAGNOSIS — N186 End stage renal disease: Secondary | ICD-10-CM | POA: Diagnosis not present

## 2017-10-20 DIAGNOSIS — I120 Hypertensive chronic kidney disease with stage 5 chronic kidney disease or end stage renal disease: Secondary | ICD-10-CM | POA: Diagnosis not present

## 2017-10-20 DIAGNOSIS — I509 Heart failure, unspecified: Secondary | ICD-10-CM | POA: Diagnosis not present

## 2017-10-20 DIAGNOSIS — D631 Anemia in chronic kidney disease: Secondary | ICD-10-CM | POA: Diagnosis not present

## 2017-10-20 DIAGNOSIS — Z992 Dependence on renal dialysis: Secondary | ICD-10-CM | POA: Diagnosis not present

## 2017-10-21 ENCOUNTER — Inpatient Hospital Stay: Payer: Medicare Other | Admitting: Oncology

## 2017-10-21 ENCOUNTER — Inpatient Hospital Stay: Payer: Medicare Other

## 2017-10-21 DIAGNOSIS — D631 Anemia in chronic kidney disease: Secondary | ICD-10-CM | POA: Diagnosis not present

## 2017-10-21 DIAGNOSIS — I120 Hypertensive chronic kidney disease with stage 5 chronic kidney disease or end stage renal disease: Secondary | ICD-10-CM | POA: Diagnosis not present

## 2017-10-21 DIAGNOSIS — N186 End stage renal disease: Secondary | ICD-10-CM | POA: Diagnosis not present

## 2017-10-21 DIAGNOSIS — I509 Heart failure, unspecified: Secondary | ICD-10-CM | POA: Diagnosis not present

## 2017-10-21 DIAGNOSIS — Z992 Dependence on renal dialysis: Secondary | ICD-10-CM | POA: Diagnosis not present

## 2017-10-22 ENCOUNTER — Other Ambulatory Visit: Payer: Self-pay | Admitting: *Deleted

## 2017-10-22 DIAGNOSIS — D631 Anemia in chronic kidney disease: Secondary | ICD-10-CM

## 2017-10-22 DIAGNOSIS — N2581 Secondary hyperparathyroidism of renal origin: Secondary | ICD-10-CM | POA: Diagnosis not present

## 2017-10-22 DIAGNOSIS — N189 Chronic kidney disease, unspecified: Principal | ICD-10-CM

## 2017-10-22 DIAGNOSIS — I120 Hypertensive chronic kidney disease with stage 5 chronic kidney disease or end stage renal disease: Secondary | ICD-10-CM | POA: Diagnosis not present

## 2017-10-22 DIAGNOSIS — N186 End stage renal disease: Secondary | ICD-10-CM | POA: Diagnosis not present

## 2017-10-22 DIAGNOSIS — I509 Heart failure, unspecified: Secondary | ICD-10-CM | POA: Diagnosis not present

## 2017-10-22 DIAGNOSIS — Z992 Dependence on renal dialysis: Secondary | ICD-10-CM | POA: Diagnosis not present

## 2017-10-22 DIAGNOSIS — D509 Iron deficiency anemia, unspecified: Secondary | ICD-10-CM | POA: Diagnosis not present

## 2017-10-25 ENCOUNTER — Encounter: Payer: Self-pay | Admitting: Hematology

## 2017-10-25 ENCOUNTER — Telehealth: Payer: Self-pay | Admitting: Hematology

## 2017-10-25 DIAGNOSIS — Z992 Dependence on renal dialysis: Secondary | ICD-10-CM | POA: Diagnosis not present

## 2017-10-25 DIAGNOSIS — D631 Anemia in chronic kidney disease: Secondary | ICD-10-CM | POA: Diagnosis not present

## 2017-10-25 DIAGNOSIS — N186 End stage renal disease: Secondary | ICD-10-CM | POA: Diagnosis not present

## 2017-10-25 DIAGNOSIS — D509 Iron deficiency anemia, unspecified: Secondary | ICD-10-CM | POA: Diagnosis not present

## 2017-10-25 DIAGNOSIS — I509 Heart failure, unspecified: Secondary | ICD-10-CM | POA: Diagnosis not present

## 2017-10-25 DIAGNOSIS — N2581 Secondary hyperparathyroidism of renal origin: Secondary | ICD-10-CM | POA: Diagnosis not present

## 2017-10-25 NOTE — Telephone Encounter (Signed)
Appt has been scheduled for the pt to see Dr. Burr Medico on 2/25 at 245pm. Appt has been given to the pt's brother. Letter mailed with appt date and time.

## 2017-10-26 DIAGNOSIS — D631 Anemia in chronic kidney disease: Secondary | ICD-10-CM | POA: Diagnosis not present

## 2017-10-26 DIAGNOSIS — N2581 Secondary hyperparathyroidism of renal origin: Secondary | ICD-10-CM | POA: Diagnosis not present

## 2017-10-26 DIAGNOSIS — I509 Heart failure, unspecified: Secondary | ICD-10-CM | POA: Diagnosis not present

## 2017-10-26 DIAGNOSIS — D509 Iron deficiency anemia, unspecified: Secondary | ICD-10-CM | POA: Diagnosis not present

## 2017-10-26 DIAGNOSIS — N186 End stage renal disease: Secondary | ICD-10-CM | POA: Diagnosis not present

## 2017-10-26 DIAGNOSIS — Z992 Dependence on renal dialysis: Secondary | ICD-10-CM | POA: Diagnosis not present

## 2017-10-28 DIAGNOSIS — N2581 Secondary hyperparathyroidism of renal origin: Secondary | ICD-10-CM | POA: Diagnosis not present

## 2017-10-28 DIAGNOSIS — N186 End stage renal disease: Secondary | ICD-10-CM | POA: Diagnosis not present

## 2017-10-28 DIAGNOSIS — I509 Heart failure, unspecified: Secondary | ICD-10-CM | POA: Diagnosis not present

## 2017-10-28 DIAGNOSIS — Z992 Dependence on renal dialysis: Secondary | ICD-10-CM | POA: Diagnosis not present

## 2017-10-28 DIAGNOSIS — D509 Iron deficiency anemia, unspecified: Secondary | ICD-10-CM | POA: Diagnosis not present

## 2017-10-28 DIAGNOSIS — D631 Anemia in chronic kidney disease: Secondary | ICD-10-CM | POA: Diagnosis not present

## 2017-10-29 DIAGNOSIS — D631 Anemia in chronic kidney disease: Secondary | ICD-10-CM | POA: Diagnosis not present

## 2017-10-29 DIAGNOSIS — D509 Iron deficiency anemia, unspecified: Secondary | ICD-10-CM | POA: Diagnosis not present

## 2017-10-29 DIAGNOSIS — Z992 Dependence on renal dialysis: Secondary | ICD-10-CM | POA: Diagnosis not present

## 2017-10-29 DIAGNOSIS — N2581 Secondary hyperparathyroidism of renal origin: Secondary | ICD-10-CM | POA: Diagnosis not present

## 2017-10-29 DIAGNOSIS — I509 Heart failure, unspecified: Secondary | ICD-10-CM | POA: Diagnosis not present

## 2017-10-29 DIAGNOSIS — N186 End stage renal disease: Secondary | ICD-10-CM | POA: Diagnosis not present

## 2017-10-31 DIAGNOSIS — I509 Heart failure, unspecified: Secondary | ICD-10-CM | POA: Diagnosis not present

## 2017-10-31 DIAGNOSIS — N186 End stage renal disease: Secondary | ICD-10-CM | POA: Diagnosis not present

## 2017-10-31 DIAGNOSIS — Z992 Dependence on renal dialysis: Secondary | ICD-10-CM | POA: Diagnosis not present

## 2017-10-31 DIAGNOSIS — N2581 Secondary hyperparathyroidism of renal origin: Secondary | ICD-10-CM | POA: Diagnosis not present

## 2017-10-31 DIAGNOSIS — D509 Iron deficiency anemia, unspecified: Secondary | ICD-10-CM | POA: Diagnosis not present

## 2017-10-31 DIAGNOSIS — D631 Anemia in chronic kidney disease: Secondary | ICD-10-CM | POA: Diagnosis not present

## 2017-11-01 ENCOUNTER — Ambulatory Visit (INDEPENDENT_AMBULATORY_CARE_PROVIDER_SITE_OTHER): Payer: Medicare Other | Admitting: Infectious Diseases

## 2017-11-01 ENCOUNTER — Encounter: Payer: Self-pay | Admitting: Infectious Diseases

## 2017-11-01 VITALS — BP 155/75 | HR 66 | Temp 98.5°F | Wt 203.0 lb

## 2017-11-01 DIAGNOSIS — I10 Essential (primary) hypertension: Secondary | ICD-10-CM | POA: Diagnosis not present

## 2017-11-01 DIAGNOSIS — I509 Heart failure, unspecified: Secondary | ICD-10-CM | POA: Diagnosis not present

## 2017-11-01 DIAGNOSIS — N186 End stage renal disease: Secondary | ICD-10-CM | POA: Diagnosis not present

## 2017-11-01 DIAGNOSIS — D509 Iron deficiency anemia, unspecified: Secondary | ICD-10-CM | POA: Diagnosis not present

## 2017-11-01 DIAGNOSIS — Z113 Encounter for screening for infections with a predominantly sexual mode of transmission: Secondary | ICD-10-CM

## 2017-11-01 DIAGNOSIS — D631 Anemia in chronic kidney disease: Secondary | ICD-10-CM | POA: Diagnosis not present

## 2017-11-01 DIAGNOSIS — B20 Human immunodeficiency virus [HIV] disease: Secondary | ICD-10-CM

## 2017-11-01 DIAGNOSIS — M00062 Staphylococcal arthritis, left knee: Secondary | ICD-10-CM

## 2017-11-01 DIAGNOSIS — F419 Anxiety disorder, unspecified: Secondary | ICD-10-CM

## 2017-11-01 DIAGNOSIS — Z79899 Other long term (current) drug therapy: Secondary | ICD-10-CM

## 2017-11-01 DIAGNOSIS — D638 Anemia in other chronic diseases classified elsewhere: Secondary | ICD-10-CM

## 2017-11-01 DIAGNOSIS — N2581 Secondary hyperparathyroidism of renal origin: Secondary | ICD-10-CM | POA: Diagnosis not present

## 2017-11-01 DIAGNOSIS — Z992 Dependence on renal dialysis: Secondary | ICD-10-CM | POA: Diagnosis not present

## 2017-11-01 MED ORDER — TRAZODONE HCL 50 MG PO TABS
50.0000 mg | ORAL_TABLET | Freq: Every evening | ORAL | 2 refills | Status: DC | PRN
Start: 2017-11-01 — End: 2018-01-29

## 2017-11-01 NOTE — Assessment & Plan Note (Addendum)
He asks for refill of sleep rx- alprazolam or trazadone.  Will give him refill of trazadone.  Advised him alprazolam can be habit forming.

## 2017-11-01 NOTE — Assessment & Plan Note (Signed)
Explained to him multiple possible etiologies for this- Renal epo deficiency, valve abn, fistula flow abn, HIV BM suppression, medications,  Appreciate heme eval.

## 2017-11-01 NOTE — Assessment & Plan Note (Signed)
He will f/u with his renal MD.  Asx.

## 2017-11-01 NOTE — Assessment & Plan Note (Signed)
His knee is functionally back to nl.  He has mild-mod heat.  There is no tenderness.  Will watch.

## 2017-11-01 NOTE — Progress Notes (Signed)
   Subjective:    Patient ID: Jeffrey [name], male    DOB: Jun 23, 1956, 62 y.o.   MRN: [id number]  HPI 62 yo M with hx of HIV+, home HD, Hepatitis B, HTN. Currently on issentress/epivir/tenfovir.  Prev seen by CVTS severe AI, aorta was 4.1 cm. Noted to have class II CHF. He had cath that showed no significant stenosis. He is to f/u in 6 months.  His last TTE 02-2017 showed EF 65-70% and mod AR.  He was hospitalized 1-22 to 1-24 with symptomatic anemia. He was seen by heme after responding poorly to 1 u txf. B19 (-). His path review of his smear showed: Smear review shows normocytic anemia without schistocytes or nucleated RBCs. WBC morphology is normal.  He completed his dapto for L sepetic knee (started 12-26) on 10-14-17. His 12-10 Cx grew MRSE.    He has heme f/u tomorrow. Is on 4x/week epo.   His knee feels "fine".  No problems with ART (isentress, tenofovir, epivir).  He continues to get HD at home. (Dr Eduard Clos)  HIV 1 RNA Quant (copies/mL)  Date Value  02/10/2017 <20 NOT DETECTED  09/09/2016 <20  06/10/2016 <20   CD4 T Cell Abs (/uL)  Date Value  02/10/2017 460  09/09/2016 250 (L)  08/07/2016 160 (L)     Review of Systems  Constitutional: Positive for fatigue. Negative for appetite change and unexpected weight change.  Respiratory: Negative for shortness of breath.   Cardiovascular: Negative for chest pain.  Gastrointestinal: Negative for constipation and diarrhea.  Genitourinary: Negative for difficulty urinating.  Neurological: Negative for headaches.  does not make urine.  Please see HPI. All other systems reviewed and negative.      Objective:   Physical Exam  Constitutional: He appears well-developed and well-nourished.  HENT:  Mouth/Throat: No oropharyngeal exudate.  Eyes: EOM are normal. Pupils are equal, round, and reactive to light.  Neck: Neck supple.  Cardiovascular: Normal rate, regular rhythm and normal heart sounds.  Pulmonary/Chest: Effort normal and  breath sounds normal.  Abdominal: Soft. Bowel sounds are normal. There is no tenderness. There is no rebound.  Musculoskeletal: He exhibits no edema.  Lymphadenopathy:    He has no cervical adenopathy.  Psychiatric: He has a normal mood and affect.      Assessment & Plan:

## 2017-11-01 NOTE — Assessment & Plan Note (Signed)
Will check his labs today.  Has gotten flu shot.  Offered/refused condoms.  Will check his labs today.  rtc in 6 months.

## 2017-11-01 NOTE — Assessment & Plan Note (Signed)
He will f/u with his HD MD.

## 2017-11-02 ENCOUNTER — Telehealth: Payer: Self-pay

## 2017-11-02 DIAGNOSIS — N2581 Secondary hyperparathyroidism of renal origin: Secondary | ICD-10-CM | POA: Diagnosis not present

## 2017-11-02 DIAGNOSIS — D509 Iron deficiency anemia, unspecified: Secondary | ICD-10-CM | POA: Diagnosis not present

## 2017-11-02 DIAGNOSIS — I509 Heart failure, unspecified: Secondary | ICD-10-CM | POA: Diagnosis not present

## 2017-11-02 DIAGNOSIS — D631 Anemia in chronic kidney disease: Secondary | ICD-10-CM | POA: Diagnosis not present

## 2017-11-02 DIAGNOSIS — Z992 Dependence on renal dialysis: Secondary | ICD-10-CM | POA: Diagnosis not present

## 2017-11-02 DIAGNOSIS — N186 End stage renal disease: Secondary | ICD-10-CM | POA: Diagnosis not present

## 2017-11-02 LAB — COMPREHENSIVE METABOLIC PANEL
AG RATIO: 1.5 (calc) (ref 1.0–2.5)
ALT: 4 U/L — AB (ref 9–46)
AST: 12 U/L (ref 10–35)
Albumin: 3.8 g/dL (ref 3.6–5.1)
Alkaline phosphatase (APISO): 80 U/L (ref 40–115)
BILIRUBIN TOTAL: 0.4 mg/dL (ref 0.2–1.2)
BUN / CREAT RATIO: 4 (calc) — AB (ref 6–22)
BUN: 44 mg/dL — ABNORMAL HIGH (ref 7–25)
CALCIUM: 8.8 mg/dL (ref 8.6–10.3)
CHLORIDE: 94 mmol/L — AB (ref 98–110)
CO2: 27 mmol/L (ref 20–32)
Creat: 10.64 mg/dL — ABNORMAL HIGH (ref 0.70–1.25)
GLOBULIN: 2.5 g/dL (ref 1.9–3.7)
Glucose, Bld: 118 mg/dL — ABNORMAL HIGH (ref 65–99)
POTASSIUM: 3.4 mmol/L — AB (ref 3.5–5.3)
SODIUM: 132 mmol/L — AB (ref 135–146)
TOTAL PROTEIN: 6.3 g/dL (ref 6.1–8.1)

## 2017-11-02 LAB — CBC
HEMATOCRIT: 23.7 % — AB (ref 38.5–50.0)
HEMOGLOBIN: 8.1 g/dL — AB (ref 13.2–17.1)
MCH: 32.5 pg (ref 27.0–33.0)
MCHC: 34.2 g/dL (ref 32.0–36.0)
MCV: 95.2 fL (ref 80.0–100.0)
MPV: 8.6 fL (ref 7.5–12.5)
Platelets: 162 10*3/uL (ref 140–400)
RBC: 2.49 10*6/uL — AB (ref 4.20–5.80)
RDW: 15.9 % — ABNORMAL HIGH (ref 11.0–15.0)
WBC: 5.9 10*3/uL (ref 3.8–10.8)

## 2017-11-02 LAB — LIPID PANEL
Cholesterol: 120 mg/dL (ref <200)
HDL: 29 mg/dL — ABNORMAL LOW (ref >40)
LDL Cholesterol (Calc): 72 mg/dL (calc)
NON-HDL CHOLESTEROL (CALC): 91 mg/dL (ref <130)
TRIGLYCERIDES: 106 mg/dL (ref <150)
Total CHOL/HDL Ratio: 4.1 (calc) (ref <5.0)

## 2017-11-02 LAB — RPR: RPR: NONREACTIVE

## 2017-11-02 LAB — T-HELPER CELL (CD4) - (RCID CLINIC ONLY)
CD4 % Helper T Cell: 30 % — ABNORMAL LOW (ref 33–55)
CD4 T Cell Abs: 390 /uL — ABNORMAL LOW (ref 400–2700)

## 2017-11-02 NOTE — Telephone Encounter (Signed)
[facility] calling with a critical lab vaule for creatinine of 10.64. Verified and read back.   Dr Johnnye Sima informed of critical lab value :  Creatinine  10.64. Verbal/read back/tkk  He advised this is a dialysis patient.   Orland Mustard, RN

## 2017-11-03 LAB — HIV-1 RNA QUANT-NO REFLEX-BLD
HIV 1 RNA Quant: <20 copies/mL — AB
HIV-1 RNA QUANT, LOG: DETECTED {Log_copies}/mL — AB

## 2017-11-04 DIAGNOSIS — N2581 Secondary hyperparathyroidism of renal origin: Secondary | ICD-10-CM | POA: Diagnosis not present

## 2017-11-04 DIAGNOSIS — D509 Iron deficiency anemia, unspecified: Secondary | ICD-10-CM | POA: Diagnosis not present

## 2017-11-04 DIAGNOSIS — I509 Heart failure, unspecified: Secondary | ICD-10-CM | POA: Diagnosis not present

## 2017-11-04 DIAGNOSIS — D631 Anemia in chronic kidney disease: Secondary | ICD-10-CM | POA: Diagnosis not present

## 2017-11-04 DIAGNOSIS — N186 End stage renal disease: Secondary | ICD-10-CM | POA: Diagnosis not present

## 2017-11-04 DIAGNOSIS — Z992 Dependence on renal dialysis: Secondary | ICD-10-CM | POA: Diagnosis not present

## 2017-11-05 DIAGNOSIS — D631 Anemia in chronic kidney disease: Secondary | ICD-10-CM | POA: Diagnosis not present

## 2017-11-05 DIAGNOSIS — I509 Heart failure, unspecified: Secondary | ICD-10-CM | POA: Diagnosis not present

## 2017-11-05 DIAGNOSIS — D509 Iron deficiency anemia, unspecified: Secondary | ICD-10-CM | POA: Diagnosis not present

## 2017-11-05 DIAGNOSIS — N2581 Secondary hyperparathyroidism of renal origin: Secondary | ICD-10-CM | POA: Diagnosis not present

## 2017-11-05 DIAGNOSIS — N186 End stage renal disease: Secondary | ICD-10-CM | POA: Diagnosis not present

## 2017-11-05 DIAGNOSIS — Z992 Dependence on renal dialysis: Secondary | ICD-10-CM | POA: Diagnosis not present

## 2017-11-08 DIAGNOSIS — N2581 Secondary hyperparathyroidism of renal origin: Secondary | ICD-10-CM | POA: Diagnosis not present

## 2017-11-08 DIAGNOSIS — D631 Anemia in chronic kidney disease: Secondary | ICD-10-CM | POA: Diagnosis not present

## 2017-11-08 DIAGNOSIS — N186 End stage renal disease: Secondary | ICD-10-CM | POA: Diagnosis not present

## 2017-11-08 DIAGNOSIS — D509 Iron deficiency anemia, unspecified: Secondary | ICD-10-CM | POA: Diagnosis not present

## 2017-11-08 DIAGNOSIS — I509 Heart failure, unspecified: Secondary | ICD-10-CM | POA: Diagnosis not present

## 2017-11-08 DIAGNOSIS — Z992 Dependence on renal dialysis: Secondary | ICD-10-CM | POA: Diagnosis not present

## 2017-11-09 DIAGNOSIS — I509 Heart failure, unspecified: Secondary | ICD-10-CM | POA: Diagnosis not present

## 2017-11-09 DIAGNOSIS — D631 Anemia in chronic kidney disease: Secondary | ICD-10-CM | POA: Diagnosis not present

## 2017-11-09 DIAGNOSIS — D509 Iron deficiency anemia, unspecified: Secondary | ICD-10-CM | POA: Diagnosis not present

## 2017-11-09 DIAGNOSIS — N2581 Secondary hyperparathyroidism of renal origin: Secondary | ICD-10-CM | POA: Diagnosis not present

## 2017-11-09 DIAGNOSIS — N186 End stage renal disease: Secondary | ICD-10-CM | POA: Diagnosis not present

## 2017-11-09 DIAGNOSIS — Z992 Dependence on renal dialysis: Secondary | ICD-10-CM | POA: Diagnosis not present

## 2017-11-11 DIAGNOSIS — D509 Iron deficiency anemia, unspecified: Secondary | ICD-10-CM | POA: Diagnosis not present

## 2017-11-11 DIAGNOSIS — Z992 Dependence on renal dialysis: Secondary | ICD-10-CM | POA: Diagnosis not present

## 2017-11-11 DIAGNOSIS — I509 Heart failure, unspecified: Secondary | ICD-10-CM | POA: Diagnosis not present

## 2017-11-11 DIAGNOSIS — N2581 Secondary hyperparathyroidism of renal origin: Secondary | ICD-10-CM | POA: Diagnosis not present

## 2017-11-11 DIAGNOSIS — D631 Anemia in chronic kidney disease: Secondary | ICD-10-CM | POA: Diagnosis not present

## 2017-11-11 DIAGNOSIS — N186 End stage renal disease: Secondary | ICD-10-CM | POA: Diagnosis not present

## 2017-11-12 DIAGNOSIS — Z992 Dependence on renal dialysis: Secondary | ICD-10-CM | POA: Diagnosis not present

## 2017-11-12 DIAGNOSIS — N186 End stage renal disease: Secondary | ICD-10-CM | POA: Diagnosis not present

## 2017-11-12 DIAGNOSIS — N2581 Secondary hyperparathyroidism of renal origin: Secondary | ICD-10-CM | POA: Diagnosis not present

## 2017-11-12 DIAGNOSIS — D631 Anemia in chronic kidney disease: Secondary | ICD-10-CM | POA: Diagnosis not present

## 2017-11-12 DIAGNOSIS — D509 Iron deficiency anemia, unspecified: Secondary | ICD-10-CM | POA: Diagnosis not present

## 2017-11-12 DIAGNOSIS — I509 Heart failure, unspecified: Secondary | ICD-10-CM | POA: Diagnosis not present

## 2017-11-15 ENCOUNTER — Inpatient Hospital Stay: Payer: Medicare Other

## 2017-11-15 ENCOUNTER — Inpatient Hospital Stay: Payer: Medicare Other | Attending: Hematology | Admitting: Hematology

## 2017-11-15 VITALS — BP 166/84 | HR 70 | Temp 97.6°F | Resp 20 | Ht 72.0 in | Wt 199.5 lb

## 2017-11-15 DIAGNOSIS — B181 Chronic viral hepatitis B without delta-agent: Secondary | ICD-10-CM | POA: Diagnosis not present

## 2017-11-15 DIAGNOSIS — D696 Thrombocytopenia, unspecified: Secondary | ICD-10-CM | POA: Diagnosis not present

## 2017-11-15 DIAGNOSIS — D631 Anemia in chronic kidney disease: Secondary | ICD-10-CM

## 2017-11-15 DIAGNOSIS — I509 Heart failure, unspecified: Secondary | ICD-10-CM | POA: Diagnosis not present

## 2017-11-15 DIAGNOSIS — I351 Nonrheumatic aortic (valve) insufficiency: Secondary | ICD-10-CM | POA: Diagnosis not present

## 2017-11-15 DIAGNOSIS — D72819 Decreased white blood cell count, unspecified: Secondary | ICD-10-CM | POA: Diagnosis not present

## 2017-11-15 DIAGNOSIS — Z79899 Other long term (current) drug therapy: Secondary | ICD-10-CM

## 2017-11-15 DIAGNOSIS — E785 Hyperlipidemia, unspecified: Secondary | ICD-10-CM

## 2017-11-15 DIAGNOSIS — G40909 Epilepsy, unspecified, not intractable, without status epilepticus: Secondary | ICD-10-CM

## 2017-11-15 DIAGNOSIS — Z7982 Long term (current) use of aspirin: Secondary | ICD-10-CM | POA: Diagnosis not present

## 2017-11-15 DIAGNOSIS — J45909 Unspecified asthma, uncomplicated: Secondary | ICD-10-CM | POA: Diagnosis not present

## 2017-11-15 DIAGNOSIS — D509 Iron deficiency anemia, unspecified: Secondary | ICD-10-CM | POA: Diagnosis not present

## 2017-11-15 DIAGNOSIS — F419 Anxiety disorder, unspecified: Secondary | ICD-10-CM | POA: Diagnosis not present

## 2017-11-15 DIAGNOSIS — N2581 Secondary hyperparathyroidism of renal origin: Secondary | ICD-10-CM | POA: Diagnosis not present

## 2017-11-15 DIAGNOSIS — Z809 Family history of malignant neoplasm, unspecified: Secondary | ICD-10-CM

## 2017-11-15 DIAGNOSIS — D649 Anemia, unspecified: Secondary | ICD-10-CM | POA: Diagnosis not present

## 2017-11-15 DIAGNOSIS — Z992 Dependence on renal dialysis: Secondary | ICD-10-CM | POA: Diagnosis not present

## 2017-11-15 DIAGNOSIS — N189 Chronic kidney disease, unspecified: Secondary | ICD-10-CM

## 2017-11-15 DIAGNOSIS — I129 Hypertensive chronic kidney disease with stage 1 through stage 4 chronic kidney disease, or unspecified chronic kidney disease: Secondary | ICD-10-CM

## 2017-11-15 DIAGNOSIS — F17210 Nicotine dependence, cigarettes, uncomplicated: Secondary | ICD-10-CM | POA: Diagnosis not present

## 2017-11-15 DIAGNOSIS — N186 End stage renal disease: Secondary | ICD-10-CM | POA: Diagnosis not present

## 2017-11-15 DIAGNOSIS — B20 Human immunodeficiency virus [HIV] disease: Secondary | ICD-10-CM

## 2017-11-15 LAB — RETICULOCYTES
RBC.: 2.7 MIL/uL — AB (ref 4.20–5.82)
RETIC COUNT ABSOLUTE: 140.4 10*3/uL — AB (ref 34.8–93.9)
Retic Ct Pct: 5.2 % — ABNORMAL HIGH (ref 0.8–1.8)

## 2017-11-15 LAB — CBC WITH DIFFERENTIAL (CANCER CENTER ONLY)
BASOS ABS: 0 10*3/uL (ref 0.0–0.1)
Basophils Relative: 1 %
EOS PCT: 5 %
Eosinophils Absolute: 0.3 10*3/uL (ref 0.0–0.5)
HCT: 26.1 % — ABNORMAL LOW (ref 38.4–49.9)
Hemoglobin: 8.6 g/dL — ABNORMAL LOW (ref 13.0–17.1)
LYMPHS PCT: 15 %
Lymphs Abs: 1 10*3/uL (ref 0.9–3.3)
MCH: 32.1 pg (ref 27.2–33.4)
MCHC: 33 g/dL (ref 32.0–36.0)
MCV: 97.4 fL (ref 79.3–98.0)
Monocytes Absolute: 0.7 10*3/uL (ref 0.1–0.9)
Monocytes Relative: 11 %
NEUTROS ABS: 4.2 10*3/uL (ref 1.5–6.5)
Neutrophils Relative %: 68 %
Platelet Count: 156 10*3/uL (ref 140–400)
RBC: 2.68 MIL/uL — AB (ref 4.20–5.82)
RDW: 16.3 % — ABNORMAL HIGH (ref 11.0–14.6)
WBC: 6.2 10*3/uL (ref 4.0–10.3)

## 2017-11-15 LAB — SAMPLE TO BLOOD BANK

## 2017-11-15 NOTE — Progress Notes (Signed)
Marland Kitchen    HEMATOLOGY/ONCOLOGY CLINIC NOTE  Date of Service: 11/15/2017  Patient Care Team: Velna Ochs, MD as PCP - General (Internal Medicine) Johnnye Sima Doroteo Bradford, MD as PCP - Infectious Diseases (Infectious Diseases) Nephrologist: Dr Pearson Grippe MD   [place]:  Worsen anemia needing PRBC transfusion.  HISTORY OF PRESENTING ILLNESS:   Jeffrey [name] is a wonderful 62 y.o. male who has been referred to [name] by Dr Pearson Grippe MD for evaluation and management of leucopenia.  Patient has multiple medical comorbidities including HIV, hepatitis B carrier, hypertension, dyslipidemia, thrombocytopenia and anemia, seizure disorder, end-stage renal disease on home dialysis, asthma.  Patient had recent labs with his Dr. Marylou Flesher on 10/27/2016 that showed leukopenia with a WBC count of 2000 and neutrophils of 42%, hemoglobin of 8.3 with an MCV of 101 and RDW of 17.8 and platelet counts of 126k. He also previously has had WBC counts of 2.3k on 09/22/2016 and 3k on 09/02/2017 with radiating degrees of anemia ranging from 6.9-8.8. And platelet counts of 126-225k.  Patient notes minimal acute fevers or chills at this time. It goes that he has been on HIV medications for 4 years with no recent new medications. He follows with Dr. Johnnye Sima for HIV treatment.  He did have a severe bronchial infection in November 2017 and was admitted to the hospital for acute shortness of breath on 09/09/2016. Has history of severe aortic insufficiency which could also be an element involvement.  Patient notes that he has been following up with nephrology for iron replacement with Venofer once weekly and ESA's every 2 weeks  Notes no abdominal pain. Getting home dialysis regularly.  No other focal symptoms suggestive of infection at this time.  INTERVAL HISTORY  Jeffrey [name] returns today regarding his anemia. The patient's last visit with [name] was on 12/31/16. He is accompanied today by  his partner. The pt reports that he is doing well overall. He notes that he had septic arthritis in his left knee in December 2018. He notes not having joint issues or joint surgeries in the past. He noted that his Hgb dropped down to 6.9 at that time, after which he has received two transfusions. He notes taking IV Epogen three times each week. He notes no problems with his dialysis. He notes that his knee had I&D. He notes that his HIV has been stable. He is through with his antibiotic regimen of 6 weeks. He denies any other infections or anything outside the norm since his last visit. He notes that his Epogen has been upped to 14k units thrice each week, from 10k. He is no longer taking Venofer. He notes that he gets labs each month. He has an appointment with his nephrologist Dr. Ferdinand Cava with [facility] tomorrow.   Most recent lab results 11/01/17 of CBC is as follows: all values are WNL except for RBC at 2.49, Hgb at 8.1, HCT at 23.7, RDW at 15.9. Hgb is up to 8.6 today and suggests improved response to EPO with resolution of his infection.  On review of systems, pt denies fevers, chills, night sweats, unexpected weight loss, back pains, abdominal pains, skin rashes, leg swelling, and any other symptoms.    MEDICAL HISTORY:  Past Medical History:  Diagnosis Date  . Anemia   . Anxiety   . Arthritis   . Asthma    per pt hx  . End stage renal disease on home HD 07/10/2011   Started HD in September 2012 at  [facility] with a tunneled HD catheter, now on home HD with [facility]. Dialyzing through AVF L lower arm with buttonhole technique as of mid 2014. His brother does the HD treatments at home.  They are roommates for 23 years.  The brother works 3rd shift and gets off about 8am and then puts Mr Follette on HD in the morning after getting home. Most of the time he does HD about 4 times a week, for about 4 hours per treatment. Cause of ESRD was HTN according to patient. He says he let his health go and ending up with  complications, and that he didn't like seeing doctors in those days.  He says he was diagnosed with severe HTN when he lived in [place] in his 70's.   . Hepatitis B carrier ([place])   . HIV infection ([place])   . Hypertension   . Hyperthyroidism    normal now  . Pneumonia several yrs ago  . Seizure ([place])   . Seizures ([place]) 06/02/2011   x 1 none since  . Thrombocytopenia ([place])     SURGICAL HISTORY: Past Surgical History:  Procedure Laterality Date  . AV FISTULA PLACEMENT  06/02/11   Left radiocephalic AVF  . COLONOSCOPY    . fistulaogram     x 2 last 2 years  . IR FLUORO GUIDE CV LINE RIGHT  09/16/2017  . IR US GUIDE VASC ACCESS RIGHT  09/16/2017  . IRRIGATION AND DEBRIDEMENT KNEE Left 09/15/2017   Procedure: IRRIGATION AND DEBRIDEMENT KNEE; arthroscopic clean out;  Surgeon: [name], MD;  Location: [facility];  Service: Orthopedics;  Laterality: Left;  . OTHER SURGICAL HISTORY     removal temporary HD catheter   . REVISON OF ARTERIOVENOUS FISTULA Left 10/10/2015   Procedure: REVISON OF LEFT RADIOCEPHALIC ARTERIOVENOUS FISTULA;  Surgeon: Angelia Mould, MD;  Location: [place];  Service: Vascular;  Laterality: Left;  . REVISON OF ARTERIOVENOUS FISTULA Left 02/07/2016   Procedure: REPAIR OF PSEUDO-ANEUREYSM OF LEFT ARM  ARTERIOVENOUS FISTULA;  Surgeon: Angelia Mould, MD;  Location: [place];  Service: Vascular;  Laterality: Left;  . REVISON OF ARTERIOVENOUS FISTULA Left 4/81/8563   Procedure: PLICATION OF LEFT ARM RADIOCEPHALIC ARTERIOVENOUS FISTULA PSEUDOANEURYSM;  Surgeon: Angelia Mould, MD;  Location: [place];  Service: Vascular;  Laterality: Left;  . RIGHT/LEFT HEART CATH AND CORONARY ANGIOGRAPHY N/A 02/17/2017   Procedure: Right/Left Heart Cath and Coronary Angiography;  Surgeon: Sherren Mocha, MD;  Location: [facility];  Service: Cardiovascular;  Laterality: N/A;  . TEE WITHOUT CARDIOVERSION N/A 09/11/2016   Procedure: TRANSESOPHAGEAL ECHOCARDIOGRAM (TEE);   Surgeon: Dorothy Spark, MD;  Location: [facility];  Service: Cardiovascular;  Laterality: N/A;    SOCIAL HISTORY: Social History   Socioeconomic History  . Marital status: Single    Spouse name: Not on file  . Number of children: Not on file  . Years of education: Not on file  . Highest education level: Not on file  Social Needs  . Financial resource strain: Not on file  . Food insecurity - worry: Not on file  . Food insecurity - inability: Not on file  . Transportation needs - medical: Not on file  . Transportation needs - non-medical: Not on file  Occupational History  . Not on file  Tobacco Use  . Smoking status: Former Smoker    Years: 10.00    Types: Cigarettes    Last attempt to quit: 08/08/2015    Years since quitting: 2.2  .  Smokeless tobacco: Never Used  . Tobacco comment: casual smoking for 10 years  Substance and Sexual Activity  . Alcohol use: No    Alcohol/week: 0.0 oz    Comment: quit 2004  . Drug use: No    Comment: uds (+) cocaine in 08/2011 but pt states was taking sudafed at the time  . Sexual activity: Not Currently    Partners: Male  Other Topics Concern  . 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 Hypertension Mother   . Diabetes Mother   . Cancer Father     ALLERGIES:  is allergic to lisinopril and penicillins.  MEDICATIONS:  Current Outpatient Medications  Medication Sig Dispense Refill  . albuterol (PROVENTIL) (2.5 MG/3ML) 0.083% nebulizer solution Take 2.5 mg by nebulization every 6 (six) hours as needed for wheezing or shortness of breath.    Marland Kitchen aspirin 81 MG chewable tablet Chew 1 tablet (81 mg total) by mouth daily. 30 tablet 0  . calcium acetate (PHOSLO) 667 MG capsule Take 667-2,001 mg by  mouth See admin instructions. 1,334-2,001 mg three times a day with each meal and 667-1,334 mg with each snack    . carvedilol (COREG) 25 MG tablet Take 1 tablet (25 mg total) by mouth 2 (two) times daily with a meal. 60 tablet 5  . cloNIDine (CATAPRES - DOSED IN MG/24 HR) 0.1 mg/24hr patch UNWRAP AND APPLY 1 PATCH TO SKIN EVERY 7 DAYS (Patient taking differently: UNWRAP AND APPLY 1 PATCH TO SKIN EVERY 7 DAYS (Fridays)) 4 patch 2  . clotrimazole-betamethasone (LOTRISONE) cream Apply 1 application topically 2 (two) times daily. 30 g 0  . doxazosin (CARDURA) 4 MG tablet Take 4 mg by mouth at bedtime.   8  . epoetin alfa (EPOGEN,PROCRIT) 2000 UNIT/ML injection Inject 2,000 Units into the vein 3 (three) times a week.    . ethyl chloride spray Apply 1 application topically daily as needed (HD). At HD  12  . heparin 1000 UNIT/ML injection Inject 3,000 Units into the vein 4 (four) times a week.     . hydrALAZINE (APRESOLINE) 100 MG tablet Take 1 tablet (100 mg total) by mouth 2 (two) times daily. 60 tablet 1  . ISENTRESS 400 MG tablet TAKE 1 TABLET(400 MG) BY MOUTH TWICE DAILY 60 tablet 5  . lamivudine (EPIVIR HBV) 5 MG/ML solution TAKE 5 ML BY MOUTH EVERY DAY (Patient taking differently: Take by mouth at bedtime. TAKE 5 ML BY MOUTH EVERY DAY) 240 mL 11  . multivitamin (RENA-VIT) TABS tablet Take 1 tablet by mouth daily.      Marland Kitchen tenofovir (VIREAD) 300 MG tablet TAKE 1 TABLET BY MOUTH EVERY SATURDAY AFTER DIALYSIS 5 tablet 5  . traZODone (DESYREL) 50 MG tablet Take 1 tablet (50 mg total) by mouth at bedtime as needed for sleep. 30 tablet 2  . VOLTAREN 1 % GEL APPLY 2 GRAMS EXTERNALLY TO THE AFFECTED AREA FOUR TIMES DAILY 100 g 1   No current facility-administered medications for this visit.     REVIEW OF SYSTEMS:    .10 Point review of Systems was done is negative except as noted above.   PHYSICAL EXAMINATION: ECOG PERFORMANCE STATUS: 1 - Symptomatic but  completely ambulatory  . Vitals:    11/15/17 1459  BP: (!) 166/84  Pulse: 70  Resp: 20  Temp: 97.6 F (36.4 C)  SpO2: 99%   Filed Weights   11/15/17 1459  Weight: 199 lb 8 oz (90.5 kg)   .Body mass index is 27.06 kg/m. Marland Kitchen GENERAL:alert, in no acute distress and comfortable SKIN: no acute rashes, no significant lesions EYES: conjunctiva are pink and non-injected, sclera anicteric OROPHARYNX: MMM, no exudates, no oropharyngeal erythema or ulceration NECK: supple, no JVD LYMPH:  no palpable lymphadenopathy in the cervical, axillary or inguinal regions LUNGS: clear to auscultation b/l with normal respiratory effort HEART: regular rate & rhythm ABDOMEN:  normoactive bowel sounds , non tender, not distended. Extremity: no pedal edema PSYCH: alert & oriented x 3 with fluent speech NEURO: no focal motor/sensory deficits    LABORATORY DATA:  I have reviewed the data as listed  . CBC Latest Ref Rng & Units 11/15/2017 11/01/2017 10/14/2017  WBC 4.0 - 10.3 K/uL 6.2 5.9 8.0  Hemoglobin 13.2 - 17.1 g/dL - 8.1(L) 7.8(L)  Hematocrit 38.4 - 49.9 % 26.1(L) 23.7(L) 22.6(L)  Platelets 140 - 400 K/uL 156 162 239  hgb 8.6 . CBC    Component Value Date/Time   WBC 6.2 11/15/2017 1622   WBC 5.9 11/01/2017 1008   RBC 2.70 (L) 11/15/2017 1625   RBC 2.68 (L) 11/15/2017 1622   HGB 8.1 (L) 11/01/2017 1008   HGB 10.0 (L) 11/18/2016 1036   HCT 26.1 (L) 11/15/2017 1622   HCT 29.9 (L) 11/18/2016 1036   HCT 29.9 (L) 11/18/2016 1036   PLT 156 11/15/2017 1622   PLT 166 11/18/2016 1036   MCV 97.4 11/15/2017 1622   MCV 95.6 11/18/2016 1036   MCH 32.1 11/15/2017 1622   MCHC 33.0 11/15/2017 1622   RDW 16.3 (H) 11/15/2017 1622   RDW 18.1 (H) 11/18/2016 1036   LYMPHSABS 1.0 11/15/2017 1622   LYMPHSABS 1.2 11/18/2016 1036   MONOABS 0.7 11/15/2017 1622   MONOABS 0.6 11/18/2016 1036   EOSABS 0.3 11/15/2017 1622   EOSABS 0.3 11/18/2016 1036   BASOSABS 0.0 11/15/2017 1622   BASOSABS 0.1 11/18/2016 1036   ANC 3.2k   CMP Latest Ref  Rng & Units 11/01/2017 10/13/2017 10/12/2017  Glucose 65 - 99 mg/dL 118(H) 99 113(H)  BUN 7 - 25 mg/dL 44(H) 53(H) 44(H)  Creatinine 0.70 - 1.25 mg/dL 10.64(H) 10.29(H) 9.30(H)  Sodium 135 - 146 mmol/L 132(L) 134(L) 131(L)  Potassium 3.5 - 5.3 mmol/L 3.4(L) 3.7 3.4(L)  Chloride 98 - 110 mmol/L 94(L) 97(L) 95(L)  CO2 20 - 32 mmol/L 27 26 24   Calcium 8.6 - 10.3 mg/dL 8.8 8.3(L) 8.7(L)  Total Protein 6.1 - 8.1 g/dL 6.3 - -  Total Bilirubin 0.2 - 1.2 mg/dL 0.4 - -  Alkaline Phos 38 - 126 U/L - - -  AST 10 - 35 U/L 12 - -  ALT 9 - 46 U/L 4(L) - -    Component     Latest Ref Rng & Units 10/13/2017 10/14/2017  Total Protein ELP     6.0 - 8.5 g/dL  6.1  Albumin ELP     2.9 - 4.4 g/dL  3.1  Alpha-1-Globulin     0.0 - 0.4 g/dL  0.3  Alpha-2-Globulin     0.4 - 1.0 g/dL  0.7  Beta Globulin     0.7 - 1.3 g/dL  0.7  Gamma Globulin     0.4 - 1.8 g/dL  1.4  M-SPIKE, %     Not Observed g/dL  Not Observed  SPE Interp.       Comment  Comment       Comment  Globulin, Total     2.2 - 3.9 g/dL  3.0  A/G Ratio     0.7 - 1.7  1.0  Kappa free light chain     3.3 - 19.4 mg/L  253.0 (H)  Lamda free light chains     5.7 - 26.3 mg/L  135.3 (H)  Kappa, lamda light chain ratio     0.26 - 1.65  1.87 (H)  LDH     98 - 192 U/L 124   Haptoglobin     34 - 200 mg/dL 188   Folate     >5.9 ng/mL  17.3  Vitamin B12     180 - 914 pg/mL  274  Parvovirus B19, PCR     Negative  Negative  TSH     0.350 - 4.500 uIU/mL  3.325    RADIOGRAPHIC STUDIES: I have personally reviewed the radiological images as listed and agreed with the findings in the report. No results found.  ASSESSMENT & PLAN:   62 year old African-American male with history of HIV on HAART therapy with   1) Anemia -  This is partly due to the HIV medications but anemia is predominantly driven by his chronic kidney disease and iron deficiency anemia related to dialysis. He had worsening anemia recently with drop in hgb to 6.9 needing  PRBC transfusion likely due to relative EPO resistance in the setting of significant left knee infection for which he is currently on IV antibiotics.  Hgb is increasing to 8.6 with currently dose of EPO  Plan: -Discussed pt labwork today; J69 and folic levels normal. Viral loads not very high. Parvovirus negative. LDH normal, no evidence of hemolysis.   -Discussed that inflammation from his recent joint infection likely caused EPO resistance with worsening of his chronic anemia from ESRD. -hgb trending up. -EPO dose has been increased from 10k units to 14k three times a week per nephrology --- continue EPO rx per nephrology -continue to monitor Ferritin and pursue IV Iron as needed with nephrology to maintain ferritin >=250 . Lab Results  Component Value Date   IRON 28 (L) 09/23/2017   TIBC 136 (L) 09/23/2017   IRONPCTSAT 21 09/23/2017   (Iron and TIBC)  Lab Results  Component Value Date   FERRITIN 851 (H) 09/23/2017   -currently no indication for additional Iron replacement. -Asked pt to let us know if his Hgb drops much before his next visit since he gets monthly labs taken with dialysis.  -Given resolving blood counts no indication for additional hematology workup at this time.  -Continue IV iron and ESA's with nephrology to manage anemia.  RTC with Dr Stephenie Navejas in 2 months to ensure stable hgb   All of the patients questions were answered with apparent satisfaction. The patient knows to call the clinic with any problems, questions or concerns.  . The total time spent in the appointment was 15 minutes and more than 50% was on counseling and direct patient cares.      Sullivan Lone MD MS AAHIVMS [facility] Hematology/Oncology Physician [facility]  (Office):       [phone] (Work cell):  [phone] (Fax):           [phone]  11/15/2017 3:41 PM  This document serves as a record of services personally  performed by Sullivan Lone, MD. It was created on his  behalf by Baldwin Jamaica, a trained medical scribe. The creation of this record is based on the scribe's personal observations and the provider's statements to them.   .I have reviewed the above documentation for accuracy and completeness, and I agree with the above. Brunetta Genera MD MS

## 2017-11-16 DIAGNOSIS — D509 Iron deficiency anemia, unspecified: Secondary | ICD-10-CM | POA: Diagnosis not present

## 2017-11-16 DIAGNOSIS — Z992 Dependence on renal dialysis: Secondary | ICD-10-CM | POA: Diagnosis not present

## 2017-11-16 DIAGNOSIS — D631 Anemia in chronic kidney disease: Secondary | ICD-10-CM | POA: Diagnosis not present

## 2017-11-16 DIAGNOSIS — N2581 Secondary hyperparathyroidism of renal origin: Secondary | ICD-10-CM | POA: Diagnosis not present

## 2017-11-16 DIAGNOSIS — I509 Heart failure, unspecified: Secondary | ICD-10-CM | POA: Diagnosis not present

## 2017-11-16 DIAGNOSIS — N186 End stage renal disease: Secondary | ICD-10-CM | POA: Diagnosis not present

## 2017-11-18 ENCOUNTER — Ambulatory Visit: Payer: Medicare Other | Admitting: Gastroenterology

## 2017-11-18 DIAGNOSIS — N186 End stage renal disease: Secondary | ICD-10-CM | POA: Diagnosis not present

## 2017-11-18 DIAGNOSIS — N2581 Secondary hyperparathyroidism of renal origin: Secondary | ICD-10-CM | POA: Diagnosis not present

## 2017-11-18 DIAGNOSIS — I509 Heart failure, unspecified: Secondary | ICD-10-CM | POA: Diagnosis not present

## 2017-11-18 DIAGNOSIS — D509 Iron deficiency anemia, unspecified: Secondary | ICD-10-CM | POA: Diagnosis not present

## 2017-11-18 DIAGNOSIS — Z992 Dependence on renal dialysis: Secondary | ICD-10-CM | POA: Diagnosis not present

## 2017-11-18 DIAGNOSIS — D631 Anemia in chronic kidney disease: Secondary | ICD-10-CM | POA: Diagnosis not present

## 2017-11-19 DIAGNOSIS — I509 Heart failure, unspecified: Secondary | ICD-10-CM | POA: Diagnosis not present

## 2017-11-19 DIAGNOSIS — N186 End stage renal disease: Secondary | ICD-10-CM | POA: Diagnosis not present

## 2017-11-19 DIAGNOSIS — Z992 Dependence on renal dialysis: Secondary | ICD-10-CM | POA: Diagnosis not present

## 2017-11-19 DIAGNOSIS — I120 Hypertensive chronic kidney disease with stage 5 chronic kidney disease or end stage renal disease: Secondary | ICD-10-CM | POA: Diagnosis not present

## 2017-11-19 DIAGNOSIS — D631 Anemia in chronic kidney disease: Secondary | ICD-10-CM | POA: Diagnosis not present

## 2017-11-19 DIAGNOSIS — D509 Iron deficiency anemia, unspecified: Secondary | ICD-10-CM | POA: Diagnosis not present

## 2017-11-19 DIAGNOSIS — N2581 Secondary hyperparathyroidism of renal origin: Secondary | ICD-10-CM | POA: Diagnosis not present

## 2017-11-22 DIAGNOSIS — I509 Heart failure, unspecified: Secondary | ICD-10-CM | POA: Diagnosis not present

## 2017-11-22 DIAGNOSIS — D509 Iron deficiency anemia, unspecified: Secondary | ICD-10-CM | POA: Diagnosis not present

## 2017-11-22 DIAGNOSIS — N2581 Secondary hyperparathyroidism of renal origin: Secondary | ICD-10-CM | POA: Diagnosis not present

## 2017-11-22 DIAGNOSIS — Z992 Dependence on renal dialysis: Secondary | ICD-10-CM | POA: Diagnosis not present

## 2017-11-22 DIAGNOSIS — N186 End stage renal disease: Secondary | ICD-10-CM | POA: Diagnosis not present

## 2017-11-22 DIAGNOSIS — D631 Anemia in chronic kidney disease: Secondary | ICD-10-CM | POA: Diagnosis not present

## 2017-11-23 DIAGNOSIS — D509 Iron deficiency anemia, unspecified: Secondary | ICD-10-CM | POA: Diagnosis not present

## 2017-11-23 DIAGNOSIS — N2581 Secondary hyperparathyroidism of renal origin: Secondary | ICD-10-CM | POA: Diagnosis not present

## 2017-11-23 DIAGNOSIS — Z992 Dependence on renal dialysis: Secondary | ICD-10-CM | POA: Diagnosis not present

## 2017-11-23 DIAGNOSIS — D631 Anemia in chronic kidney disease: Secondary | ICD-10-CM | POA: Diagnosis not present

## 2017-11-23 DIAGNOSIS — N186 End stage renal disease: Secondary | ICD-10-CM | POA: Diagnosis not present

## 2017-11-23 DIAGNOSIS — I509 Heart failure, unspecified: Secondary | ICD-10-CM | POA: Diagnosis not present

## 2017-11-25 DIAGNOSIS — Z992 Dependence on renal dialysis: Secondary | ICD-10-CM | POA: Diagnosis not present

## 2017-11-25 DIAGNOSIS — N2581 Secondary hyperparathyroidism of renal origin: Secondary | ICD-10-CM | POA: Diagnosis not present

## 2017-11-25 DIAGNOSIS — I509 Heart failure, unspecified: Secondary | ICD-10-CM | POA: Diagnosis not present

## 2017-11-25 DIAGNOSIS — D631 Anemia in chronic kidney disease: Secondary | ICD-10-CM | POA: Diagnosis not present

## 2017-11-25 DIAGNOSIS — N186 End stage renal disease: Secondary | ICD-10-CM | POA: Diagnosis not present

## 2017-11-25 DIAGNOSIS — D509 Iron deficiency anemia, unspecified: Secondary | ICD-10-CM | POA: Diagnosis not present

## 2017-11-29 DIAGNOSIS — D509 Iron deficiency anemia, unspecified: Secondary | ICD-10-CM | POA: Diagnosis not present

## 2017-11-29 DIAGNOSIS — N2581 Secondary hyperparathyroidism of renal origin: Secondary | ICD-10-CM | POA: Diagnosis not present

## 2017-11-29 DIAGNOSIS — Z992 Dependence on renal dialysis: Secondary | ICD-10-CM | POA: Diagnosis not present

## 2017-11-29 DIAGNOSIS — D631 Anemia in chronic kidney disease: Secondary | ICD-10-CM | POA: Diagnosis not present

## 2017-11-29 DIAGNOSIS — N186 End stage renal disease: Secondary | ICD-10-CM | POA: Diagnosis not present

## 2017-11-29 DIAGNOSIS — I509 Heart failure, unspecified: Secondary | ICD-10-CM | POA: Diagnosis not present

## 2017-11-30 ENCOUNTER — Encounter: Payer: Self-pay | Admitting: Gastroenterology

## 2017-11-30 ENCOUNTER — Ambulatory Visit (INDEPENDENT_AMBULATORY_CARE_PROVIDER_SITE_OTHER): Payer: Medicare Other | Admitting: Gastroenterology

## 2017-11-30 ENCOUNTER — Other Ambulatory Visit: Payer: Self-pay | Admitting: Internal Medicine

## 2017-11-30 VITALS — BP 196/88 | HR 69 | Ht 72.0 in | Wt 198.4 lb

## 2017-11-30 DIAGNOSIS — N186 End stage renal disease: Secondary | ICD-10-CM | POA: Diagnosis not present

## 2017-11-30 DIAGNOSIS — I10 Essential (primary) hypertension: Secondary | ICD-10-CM

## 2017-11-30 DIAGNOSIS — D631 Anemia in chronic kidney disease: Secondary | ICD-10-CM | POA: Diagnosis not present

## 2017-11-30 DIAGNOSIS — Z992 Dependence on renal dialysis: Secondary | ICD-10-CM | POA: Diagnosis not present

## 2017-11-30 DIAGNOSIS — D509 Iron deficiency anemia, unspecified: Secondary | ICD-10-CM | POA: Diagnosis not present

## 2017-11-30 DIAGNOSIS — D539 Nutritional anemia, unspecified: Secondary | ICD-10-CM

## 2017-11-30 DIAGNOSIS — I509 Heart failure, unspecified: Secondary | ICD-10-CM | POA: Diagnosis not present

## 2017-11-30 DIAGNOSIS — N2581 Secondary hyperparathyroidism of renal origin: Secondary | ICD-10-CM | POA: Diagnosis not present

## 2017-11-30 MED ORDER — PEG 3350-KCL-NA BICARB-NACL 420 G PO SOLR: 4000.0000 mL | Freq: Once | ORAL | 0 refills | Status: AC

## 2017-11-30 NOTE — Telephone Encounter (Signed)
Overdue for PCP appt. pls sch ASAP HTN F/U PCP

## 2017-11-30 NOTE — Progress Notes (Signed)
[name] MD, MRCP(U.K) [address]  [place]  [place], [place] [address]  Main: [phone]  Fax: [phone]   Gastroenterology Consultation  Referring Provider:     Anthonette Legato, MD Primary Care Physician:  Velna Ochs, MD Primary Gastroenterologist:  Dr. [name]  Reason for Consultation:     Anemia         HPI:   Jeffrey [name] is a 62 y.o. y/o male referred for consultation & management  by Dr. Velna Ochs, MD.    He has been referred for anemia. He is on treatment for HIV . H/o aortic regurgitation . Has been on iron replacement and venofer per nephrology , he is on dialysis. His oncologist Dr Irene Limbo feels that the anemia is likely from HIV/Kidney disease .    Labs 11/15/17 - Hb 8.6 grams with MCV 97 . B12 274 : 09/2017 . No recent iron studies.   He says he just found out he is anemic recently. He denies any any overt blood. No blood in his stool. Does not urinate. Denies any nasal bleeds. Not on any blood thinners except heparin during dialysis.    He says that he has had a colonoscopy many years back, no family history of colon cancer. No polyps at that time.   He says that he has no issues with his heart valve presently.    Past Medical History:  Diagnosis Date  . Anemia   . Anxiety   . Arthritis   . Asthma    per pt hx  . End stage renal disease on home HD 07/10/2011   Started HD in September 2012 at [facility] with a tunneled HD catheter, now on home HD with NxtStage. Dialyzing through AVF L lower arm with buttonhole technique as of mid 2014. His brother does the HD treatments at home.  They are roommates for 23 years.  The brother works 3rd shift and gets off about 8am and then puts Jeffrey [name] on HD in the morning after getting home. Most of the time he does HD about 4 times a week, for about 4 hours per treatment. Cause of ESRD was HTN according to patient. He says he let his health go and ending up with complications, and that he didn't like  seeing doctors in those days.  He says he was diagnosed with severe HTN when he lived in [place] in his 50's.   . Hepatitis B carrier ([place])   . HIV infection ([place])   . Hypertension   . Hyperthyroidism    normal now  . Pneumonia several yrs ago  . Seizure ([place])   . Seizures ([place]) 06/02/2011   x 1 none since  . Thrombocytopenia ([place])     Past Surgical History:  Procedure Laterality Date  . AV FISTULA PLACEMENT  06/02/11   Left radiocephalic AVF  . COLONOSCOPY    . fistulaogram     x 2 last 2 years  . IR FLUORO GUIDE CV LINE RIGHT  09/16/2017  . IR US GUIDE VASC ACCESS RIGHT  09/16/2017  . IRRIGATION AND DEBRIDEMENT KNEE Left 09/15/2017   Procedure: IRRIGATION AND DEBRIDEMENT KNEE; arthroscopic clean out;  Surgeon: [name], MD;  Location: [facility];  Service: Orthopedics;  Laterality: Left;  . OTHER SURGICAL HISTORY     removal temporary HD catheter   . REVISON OF ARTERIOVENOUS FISTULA Left 10/10/2015   Procedure: REVISON OF LEFT RADIOCEPHALIC ARTERIOVENOUS FISTULA;  Surgeon: Angelia Mould, MD;  [facility];  Service: Vascular;  Laterality: Left;  . REVISON OF ARTERIOVENOUS FISTULA Left 02/07/2016   Procedure: REPAIR OF PSEUDO-ANEUREYSM OF LEFT ARM  ARTERIOVENOUS FISTULA;  Surgeon: Angelia Mould, MD;  Location: [place];  Service: Vascular;  Laterality: Left;  . REVISON OF ARTERIOVENOUS FISTULA Left 04/09/9469   Procedure: PLICATION OF LEFT ARM RADIOCEPHALIC ARTERIOVENOUS FISTULA PSEUDOANEURYSM;  Surgeon: Angelia Mould, MD;  Location: [place];  Service: Vascular;  Laterality: Left;  . RIGHT/LEFT HEART CATH AND CORONARY ANGIOGRAPHY N/A 02/17/2017   Procedure: Right/Left Heart Cath and Coronary Angiography;  Surgeon: Sherren Mocha, MD;  Location: [place] [facility];  Service: Cardiovascular;  Laterality: N/A;  . TEE WITHOUT CARDIOVERSION N/A 09/11/2016   Procedure: TRANSESOPHAGEAL ECHOCARDIOGRAM (TEE);  Surgeon: Dorothy Spark, MD;  Location: [place];   Service: Cardiovascular;  Laterality: N/A;    Prior to Admission medications   Medication Sig Start Date End Date Taking? Authorizing Provider  albuterol (PROVENTIL) (2.5 MG/3ML) 0.083% nebulizer solution Take 2.5 mg by nebulization every 6 (six) hours as needed for wheezing or shortness of breath.    [provider]  ALPRAZolam Duanne Moron) 0.25 MG tablet  11/29/17   [provider]  aspirin 81 MG chewable tablet Chew 1 tablet (81 mg total) by mouth daily. 10/15/17   Vaughan Basta, MD  calcium acetate (PHOSLO) 667 MG capsule Take 667-2,001 mg by mouth See admin instructions. 1,334-2,001 mg three times a day with each meal and 667-1,334 mg with each snack 06/06/15   [provider]  carvedilol (COREG) 25 MG tablet Take 1 tablet (25 mg total) by mouth 2 (two) times daily with a meal. 10/05/17   Alphonzo Grieve, MD  cloNIDine (CATAPRES - DOSED IN MG/24 HR) 0.1 mg/24hr patch UNWRAP AND APPLY 1 PATCH TO SKIN EVERY 7 DAYS 11/30/17   Bartholomew Crews, MD  clotrimazole-betamethasone (LOTRISONE) cream Apply 1 application topically 2 (two) times daily. 09/08/17   Campbell Riches, MD  doxazosin (CARDURA) 4 MG tablet Take 4 mg by mouth at bedtime.  07/04/17   [provider]  epoetin alfa (EPOGEN,PROCRIT) 2000 UNIT/ML injection Inject 2,000 Units into the vein 3 (three) times a week.    [provider]  ethyl chloride spray Apply 1 application topically daily as needed (HD). At HD 05/20/15   [provider]  heparin 1000 UNIT/ML injection Inject 3,000 Units into the vein 4 (four) times a week.     [provider]  hydrALAZINE (APRESOLINE) 100 MG tablet Take 1 tablet (100 mg total) by mouth 2 (two) times daily. 09/22/17   Velna Ochs, MD  ISENTRESS 400 MG tablet TAKE 1 TABLET(400 MG) BY MOUTH TWICE DAILY 10/05/17   Campbell Riches, MD  lamivudine (EPIVIR HBV) 5 MG/ML solution TAKE 5 ML BY MOUTH EVERY DAY Patient taking differently:  Take by mouth at bedtime. TAKE 5 ML BY MOUTH EVERY DAY 09/08/17   Campbell Riches, MD  multivitamin (RENA-VIT) TABS tablet Take 1 tablet by mouth daily.      [provider]  tenofovir (VIREAD) 300 MG tablet TAKE 1 TABLET BY MOUTH EVERY SATURDAY AFTER DIALYSIS 10/05/17   Campbell Riches, MD  traZODone (DESYREL) 50 MG tablet Take 1 tablet (50 mg total) by mouth at bedtime as needed for sleep. 11/01/17   Campbell Riches, MD  VOLTAREN 1 % GEL APPLY 2 GRAMS EXTERNALLY TO THE AFFECTED AREA FOUR TIMES DAILY 09/08/17   Campbell Riches, MD  Family History  Problem Relation Age of Onset  . Hypertension Mother   . Diabetes Mother   . Cancer Father      Social History   Tobacco Use  . Smoking status: Former Smoker    Years: 10.00    Types: Cigarettes    Last attempt to quit: 08/08/2015    Years since quitting: 2.3  . Smokeless tobacco: Never Used  . Tobacco comment: casual smoking for 10 years  Substance Use Topics  . Alcohol use: No    Alcohol/week: 0.0 oz    Comment: quit 2004  . Drug use: No    Comment: uds (+) cocaine in 08/2011 but pt states was taking sudafed at the time    Allergies as of 11/30/2017 - Review Complete 11/15/2017  Allergen Reaction Noted  . Lisinopril Anaphylaxis and Shortness Of Breath 08/31/2012  . Penicillins Other (See Comments)     Review of Systems:    All systems reviewed and negative except where noted in HPI.   Physical Exam:  There were no vitals taken for this visit. No LMP for male patient. Psych:  Alert and cooperative. Normal mood and affect. General:   Alert,  Well-developed, well-nourished, pleasant and cooperative in NAD Head:  Normocephalic and atraumatic. Eyes:  Sclera clear, no icterus.   Conjunctiva pink. Ears:  Normal auditory acuity. Nose:  No deformity, discharge, or lesions. Mouth:  No deformity or lesions,oropharynx pink & moist. Neck:  Supple; no masses or thyromegaly. Lungs:  Respirations even and  unlabored.  Clear throughout to auscultation.   No wheezes, crackles, or rhonchi. No acute distress. Heart:  Regular rate and rhythm; systolic murmur heart in the aortic area.  Abdomen:  Normal bowel sounds.  No bruits.  Soft, non-tender and non-distended without masses, hepatosplenomegaly or hernias noted.  No guarding or rebound tenderness.    Neurologic:  Alert and oriented x3;  grossly normal neurologically. Skin:  Intact without significant lesions or rashes. No jaundice. Lymph Nodes:  No significant cervical adenopathy. Psych:  Alert and cooperative. Normal mood and affect.  Imaging Studies: No results found.  Assessment and Plan:   Jeffrey [name] is a 62 y.o. y/o male has been referred for  anemia. No recent iron studies but has received IV iron in the past for iron deficiency . H/o CKD, aortic regurgitation , ESRD on dialysis    Plan  1. EGD+colonoscopy +/- capsule study of the smal l bowel  2. Cardiology clearance prior to procedures.   I have discussed alternative options, risks & benefits,  which include, but are not limited to, bleeding, infection, perforation,respiratory complication & drug reaction.  The patient agrees with this plan & written consent will be obtained.     Follow up in 8 weeks  Dr [name] MD,MRCP(U.K)

## 2017-12-01 ENCOUNTER — Other Ambulatory Visit: Payer: Self-pay | Admitting: Internal Medicine

## 2017-12-01 ENCOUNTER — Telehealth: Payer: Self-pay

## 2017-12-01 DIAGNOSIS — I10 Essential (primary) hypertension: Secondary | ICD-10-CM

## 2017-12-01 NOTE — Telephone Encounter (Signed)
   [facility] Pre-operative Risk Assessment    Request for surgical clearance:  1. What type of surgery is being performed? Colonoscopy and EGD   2. When is this surgery scheduled? 12/17/17   3. What type of clearance is required (medical clearance vs. Pharmacy clearance to hold med vs. Both)? Medical Clearance  4. Are there any medications that need to be held prior to surgery and how long? None listed   5. Practice name and name of physician performing surgery? [facility] - [name], MD  6. What is your office phone and fax number? Phone: [phone] Fax: [phone], ATTN: Panya C.   7. Anesthesia type (None, local, MAC, general) ? General   Jacinta Shoe 12/01/2017, 2:04 PM  _________________________________________________________________   (provider comments below)

## 2017-12-02 DIAGNOSIS — D509 Iron deficiency anemia, unspecified: Secondary | ICD-10-CM | POA: Diagnosis not present

## 2017-12-02 DIAGNOSIS — N186 End stage renal disease: Secondary | ICD-10-CM | POA: Diagnosis not present

## 2017-12-02 DIAGNOSIS — I509 Heart failure, unspecified: Secondary | ICD-10-CM | POA: Diagnosis not present

## 2017-12-02 DIAGNOSIS — Z992 Dependence on renal dialysis: Secondary | ICD-10-CM | POA: Diagnosis not present

## 2017-12-02 DIAGNOSIS — D631 Anemia in chronic kidney disease: Secondary | ICD-10-CM | POA: Diagnosis not present

## 2017-12-02 DIAGNOSIS — N2581 Secondary hyperparathyroidism of renal origin: Secondary | ICD-10-CM | POA: Diagnosis not present

## 2017-12-06 DIAGNOSIS — D631 Anemia in chronic kidney disease: Secondary | ICD-10-CM | POA: Diagnosis not present

## 2017-12-06 DIAGNOSIS — N186 End stage renal disease: Secondary | ICD-10-CM | POA: Diagnosis not present

## 2017-12-06 DIAGNOSIS — I509 Heart failure, unspecified: Secondary | ICD-10-CM | POA: Diagnosis not present

## 2017-12-06 DIAGNOSIS — Z992 Dependence on renal dialysis: Secondary | ICD-10-CM | POA: Diagnosis not present

## 2017-12-06 DIAGNOSIS — N2581 Secondary hyperparathyroidism of renal origin: Secondary | ICD-10-CM | POA: Diagnosis not present

## 2017-12-06 DIAGNOSIS — D509 Iron deficiency anemia, unspecified: Secondary | ICD-10-CM | POA: Diagnosis not present

## 2017-12-06 NOTE — Telephone Encounter (Signed)
   Primary [place], MD  Chart reviewed as part of pre-operative protocol coverage. Because of Jeffrey [name]'s past medical history and time since last visit, he/she will require a follow-up visit in order to better assess preoperative cardiovascular risk.  Pre-op covering staff: - Please schedule appointment and call patient to inform them. - Please contact requesting surgeon's office via preferred method (i.e, phone, fax) to inform them of need for appointment prior to surgery.  Almyra Deforest, [place]  12/06/2017, 3:38 PM

## 2017-12-06 NOTE — Telephone Encounter (Signed)
Spoke with patient brother, per dpr, appointment made, Brother voiced understanding.

## 2017-12-07 DIAGNOSIS — N2581 Secondary hyperparathyroidism of renal origin: Secondary | ICD-10-CM | POA: Diagnosis not present

## 2017-12-07 DIAGNOSIS — N186 End stage renal disease: Secondary | ICD-10-CM | POA: Diagnosis not present

## 2017-12-07 DIAGNOSIS — Z992 Dependence on renal dialysis: Secondary | ICD-10-CM | POA: Diagnosis not present

## 2017-12-07 DIAGNOSIS — I509 Heart failure, unspecified: Secondary | ICD-10-CM | POA: Diagnosis not present

## 2017-12-07 DIAGNOSIS — D509 Iron deficiency anemia, unspecified: Secondary | ICD-10-CM | POA: Diagnosis not present

## 2017-12-07 DIAGNOSIS — D631 Anemia in chronic kidney disease: Secondary | ICD-10-CM | POA: Diagnosis not present

## 2017-12-09 DIAGNOSIS — D631 Anemia in chronic kidney disease: Secondary | ICD-10-CM | POA: Diagnosis not present

## 2017-12-09 DIAGNOSIS — I509 Heart failure, unspecified: Secondary | ICD-10-CM | POA: Diagnosis not present

## 2017-12-09 DIAGNOSIS — N186 End stage renal disease: Secondary | ICD-10-CM | POA: Diagnosis not present

## 2017-12-09 DIAGNOSIS — D509 Iron deficiency anemia, unspecified: Secondary | ICD-10-CM | POA: Diagnosis not present

## 2017-12-09 DIAGNOSIS — N2581 Secondary hyperparathyroidism of renal origin: Secondary | ICD-10-CM | POA: Diagnosis not present

## 2017-12-09 DIAGNOSIS — Z992 Dependence on renal dialysis: Secondary | ICD-10-CM | POA: Diagnosis not present

## 2017-12-12 NOTE — Progress Notes (Deleted)
Cardiology Office Note:    Date:  12/12/2017   ID:  Jeffrey [name], DOB 1955-11-07, MRN [id number]  PCP:  Velna Ochs, MD  Cardiologist:  Sherren Mocha, MD  Referring MD: Velna Ochs, MD   No chief complaint on file. ***  History of Present Illness:    Jeffrey [name] is a 62 y.o. male with a past medical history significant for HIV, ESRD on home HD (4 times/week), hypertension, anemia of chronic disease, hyperthyroidism, thrombocytopenia, PAD and aortic insufficiency.  He also had MSSA bacteremia in 07/2016 from unclear source associated with worsening of aortic insufficiency from mild to moderate over a period of a few months.  TEE revealed severe aortic regurgitation, no endocarditis.   Echo 04/09/16 EF 60-65% Moderate LVH G1DD AV mild AR LA moderately dilated Echo 07/2016 EF 60-65% moderate LVH G2DD AV with moderate Aortic regurgitation LA severely dilated TEE 08/2016 EF 60-65% Severe Aortic regurgitation , no thrombus in atrium no vegetation.  TTE 03/09/2017 EF 65-70% severe LVH EF 65-70% G1 DD moderate aortic regurgitation  He underwent right and left heart cath in 01/2017 which revealed heavily calcified coronary arteries with mild diffuse nonobstructive CAD, severe aortic valve insufficiency, normal right heart hemodynamics, normal LV systolic function.  An echocardiogram 03/09/17 showed severe LVH, normal LV systolic function with EF 91-47%, grade 1 diastolic dysfunction and moderate aortic regurgitation.    In February/2018 patient was referred to cardiothoracic surgery for evaluation of severe aortic insufficiency with dyspnea on exertion.  He delayed consideration for surgery for several months.  In 02/2017 Dr. Roxan Hockey repeated an echocardiogram, mainly to ensure that there were no other considerations prior to aortic valve replacement.  Surprisingly, the aortic insufficiency was only moderate.  He was not having any heart failure type symptoms and Dr.  Roxan Hockey felt that there was not sufficient indication for aortic valve replacement at that time.       Clearance for colonoscopy and EGD  Aortic insufficiency: Have been found to be severe in 08/2016 however upon recheck in 02/2017 was only moderate.  Been seen by CT surgery in 03/2017 and thought that there was not sufficient indication for aortic valve replacement at that time.  The patient continues without any heart failure symptoms.  Hypertension: Labetalol 25 mg twice daily, clonidine patch, hydralazine 100 mg twice daily  ESRD: Hemodialysis.  Followed by  Anemia of chronic disease: Globin in the 8 range in February, was down to 6.8 in January received 1 unit packed red blood cell transfusion.  Patient on Epogen per nephrology     Past Medical History:  Diagnosis Date  . Anemia   . Anxiety   . Arthritis   . Asthma    per pt hx  . End stage renal disease on home HD 07/10/2011   Started HD in September 2012 at [facility] with a tunneled HD catheter, now on home HD with NxtStage. Dialyzing through AVF L lower arm with buttonhole technique as of mid 2014. His brother does the HD treatments at home.  They are roommates for 23 years.  The brother works 3rd shift and gets off about 8am and then puts [name] on HD in the morning after getting home. Most of the time he does HD about 4 times a week, for about 4 hours per treatment. Cause of ESRD was HTN according to patient. He says he let his health go and ending up with complications, and that he didn't like seeing doctors in those days.  He says he was diagnosed with severe HTN when he lived in [place] in his 32's.   . Hepatitis B carrier ([place])   . HIV infection ([place])   . Hypertension   . Hyperthyroidism    normal now  . Pneumonia several yrs ago  . Seizure ([place])   . Seizures ([place]) 06/02/2011   x 1 none since  . Thrombocytopenia ([place])     Past Surgical History:  Procedure Laterality Date  . AV FISTULA PLACEMENT  06/02/11    Left radiocephalic AVF  . COLONOSCOPY    . fistulaogram     x 2 last 2 years  . IR FLUORO GUIDE CV LINE RIGHT  09/16/2017  . IR US GUIDE VASC ACCESS RIGHT  09/16/2017  . IRRIGATION AND DEBRIDEMENT KNEE Left 09/15/2017   Procedure: IRRIGATION AND DEBRIDEMENT KNEE; arthroscopic clean out;  Surgeon: [name], MD;  [facility]: [facility];  Service: Orthopedics;  Laterality: Left;  . OTHER SURGICAL HISTORY     removal temporary HD catheter   . REVISON OF ARTERIOVENOUS FISTULA Left 10/10/2015   Procedure: REVISON OF LEFT RADIOCEPHALIC ARTERIOVENOUS FISTULA;  Surgeon: Angelia Mould, MD;  Location: [place];  Service: Vascular;  Laterality: Left;  . REVISON OF ARTERIOVENOUS FISTULA Left 02/07/2016   Procedure: REPAIR OF PSEUDO-ANEUREYSM OF LEFT ARM  ARTERIOVENOUS FISTULA;  Surgeon: Angelia Mould, MD;  Location: [place];  Service: Vascular;  Laterality: Left;  . REVISON OF ARTERIOVENOUS FISTULA Left 6/65/9935   Procedure: PLICATION OF LEFT ARM RADIOCEPHALIC ARTERIOVENOUS FISTULA PSEUDOANEURYSM;  Surgeon: Angelia Mould, MD;  Location: [place];  Service: Vascular;  Laterality: Left;  . RIGHT/LEFT HEART CATH AND CORONARY ANGIOGRAPHY N/A 02/17/2017   Procedure: Right/Left Heart Cath and Coronary Angiography;  Surgeon: Sherren Mocha, MD;  Location: [place] [facility];  Service: Cardiovascular;  Laterality: N/A;  . TEE WITHOUT CARDIOVERSION N/A 09/11/2016   Procedure: TRANSESOPHAGEAL ECHOCARDIOGRAM (TEE);  Surgeon: Dorothy Spark, MD;  Location: [facility];  Service: Cardiovascular;  Laterality: N/A;    Current Medications: No outpatient medications have been marked as taking for the 12/13/17 encounter (Appointment) with Daune Perch, NP.     Allergies:   Lisinopril and Penicillins   Social History   Socioeconomic History  . Marital status: Single    Spouse name: Not on file  . Number of children: Not on file  . Years of education: Not on file  . Highest education level:  Not on file  Occupational History  . Not on file  Social Needs  . Financial resource strain: Not on file  . Food insecurity:    Worry: Not on file    Inability: Not on file  . Transportation needs:    Medical: Not on file    Non-medical: Not on file  Tobacco Use  . Smoking status: Former Smoker    Years: 10.00    Types: Cigarettes    Last attempt to quit: 08/08/2015    Years since quitting: 2.3  . Smokeless tobacco: Never Used  . Tobacco comment: casual smoking for 10 years  Substance and Sexual Activity  . Alcohol use: No    Alcohol/week: 0.0 oz    Comment: quit 2004  . Drug use: No    Comment: uds (+) cocaine in 08/2011 but pt states was taking sudafed at the time  . Sexual activity: Not Currently    Partners: Male  Lifestyle  . Physical activity:    Days per week: Not on file  Minutes per session: Not on file  . Stress: Not on file  Relationships  . Social connections:    Talks on phone: Not on file    Gets together: Not on file    Attends religious service: Not on file    Active member of club or organization: Not on file    Attends meetings of clubs or organizations: Not on file    Relationship status: Not on file  Other Topics Concern  . 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 ROS:   Please see the history of present illness.    *** All other systems reviewed and are negative.  EKGs/Labs/Other Studies Reviewed:    The following studies were reviewed today:  Echocardiogram 03/09/17 Study Conclusions - Left ventricle: The cavity size was normal. Wall thickness was   increased in a pattern of severe LVH. Systolic function was   vigorous. The estimated  ejection fraction was in the range of 65%   to 70%. Wall motion was normal; there were no regional wall   motion abnormalities. Doppler parameters are consistent with   abnormal left ventricular relaxation (grade 1 diastolic   dysfunction). - Aortic valve: There was moderate regurgitation.  Right/Left Heart Cath and Coronary Angiography  02/17/17  Conclusion  1. Heavily calcified coronary arteries with mild diffuse nonobstructive CAD 2. Severe aortic valve insufficiency 3. Normal right heart hemodynamics 4. Normal LV systolic function  Recommend: Continued evaluation by Dr. Roxan Hockey for consideration of aortic valve replacement.      EKG:  EKG is *** ordered today.  The ekg ordered today demonstrates ***  Recent Labs: 10/14/2017: TSH 3.325 11/01/2017: ALT 4; BUN 44; Creat 10.64; Hemoglobin 8.1; Potassium 3.4; Sodium 132 11/15/2017: Platelet Count 156   Recent Lipid Panel    Component Value Date/Time   CHOL 120 11/01/2017 1008   TRIG 106 11/01/2017 1008   HDL 29 (L) 11/01/2017 1008   CHOLHDL 4.1 11/01/2017 1008   VLDL 5 02/10/2017 1619   LDLCALC 72 11/01/2017 1008    Physical Exam:    VS:  There were no vitals taken for this visit.    Wt Readings from Last 3 Encounters:  11/30/17 198 lb 6.4 oz (90 kg)  11/15/17 199 lb 8 oz (90.5 kg)  11/01/17 203 lb (92.1 kg)     Physical Exam***   ASSESSMENT:    No diagnosis found. PLAN:    In order of problems listed above:  1. ***   Medication Adjustments/Labs and Tests Ordered: Current medicines are reviewed at length with the patient today.  Concerns regarding medicines are outlined above. Labs and tests ordered and medication changes are outlined in the patient instructions below:  There are no Patient Instructions on file for this visit.   Signed, Daune Perch, NP  12/12/2017 8:34 PM    [facility]

## 2017-12-13 ENCOUNTER — Ambulatory Visit (INDEPENDENT_AMBULATORY_CARE_PROVIDER_SITE_OTHER): Payer: Medicare Other | Admitting: Cardiology

## 2017-12-13 ENCOUNTER — Encounter: Payer: Self-pay | Admitting: Cardiology

## 2017-12-13 ENCOUNTER — Other Ambulatory Visit: Payer: Self-pay | Admitting: Infectious Diseases

## 2017-12-13 ENCOUNTER — Ambulatory Visit: Payer: Medicare Other | Admitting: Cardiology

## 2017-12-13 VITALS — BP 180/80 | HR 83 | Ht 72.0 in

## 2017-12-13 DIAGNOSIS — N2581 Secondary hyperparathyroidism of renal origin: Secondary | ICD-10-CM | POA: Diagnosis not present

## 2017-12-13 DIAGNOSIS — D631 Anemia in chronic kidney disease: Secondary | ICD-10-CM

## 2017-12-13 DIAGNOSIS — Z992 Dependence on renal dialysis: Secondary | ICD-10-CM | POA: Diagnosis not present

## 2017-12-13 DIAGNOSIS — I10 Essential (primary) hypertension: Secondary | ICD-10-CM

## 2017-12-13 DIAGNOSIS — I351 Nonrheumatic aortic (valve) insufficiency: Secondary | ICD-10-CM | POA: Diagnosis not present

## 2017-12-13 DIAGNOSIS — N186 End stage renal disease: Secondary | ICD-10-CM | POA: Diagnosis not present

## 2017-12-13 DIAGNOSIS — Z01818 Encounter for other preprocedural examination: Secondary | ICD-10-CM

## 2017-12-13 DIAGNOSIS — B20 Human immunodeficiency virus [HIV] disease: Secondary | ICD-10-CM

## 2017-12-13 DIAGNOSIS — N189 Chronic kidney disease, unspecified: Secondary | ICD-10-CM

## 2017-12-13 DIAGNOSIS — D509 Iron deficiency anemia, unspecified: Secondary | ICD-10-CM | POA: Diagnosis not present

## 2017-12-13 DIAGNOSIS — I509 Heart failure, unspecified: Secondary | ICD-10-CM | POA: Diagnosis not present

## 2017-12-13 NOTE — Patient Instructions (Signed)
Medication Instructions:  Your physician recommends that you continue on your current medications as directed. Please refer to the Current Medication list given to you today.  Labwork: None  Testing/Procedures: None  Follow-Up: Your physician recommends that you schedule a follow-up appointment in: June or July with Dr. Burt Knack.    Any Other Special Instructions Will Be Listed Below (If Applicable).     If you need a refill on your cardiac medications before your next appointment, please call your pharmacy.

## 2017-12-13 NOTE — Progress Notes (Signed)
Cardiology Office Note:    Date:  12/13/2017   ID:  Jeffrey [name], DOB [birth year], MRN [id number]  PCP:  Velna Ochs, MD  Cardiologist:  Sherren Mocha, MD  Referring MD: Velna Ochs, MD   Chief Complaint  Patient presents with  . Pre-op Exam    History of Present Illness:    Jeffrey [name] is a 62 y.o. male with a past medical history significant for HIV, ESRD on home HD (4 times/week), hypertension, anemia of chronic disease, hyperthyroidism, thrombocytopenia, PAD and aortic insufficiency.  He also had MSSA bacteremia in 07/2016 from unclear source associated with worsening of aortic insufficiency from mild to moderate over a period of a few months.  TEE revealed severe aortic regurgitation, no endocarditis.   Echo 04/09/16 EF 60-65% Moderate LVH G1DD AV mild AR LA moderately dilated Echo 07/2016 EF 60-65% moderate LVH G2DD AV with moderate Aortic regurgitation LA severely dilated TEE 08/2016 EF 60-65% Severe Aortic regurgitation , no thrombus in atrium no vegetation.  TTE 03/09/2017 EF 65-70% severe LVH EF 65-70% G1 DD moderate aortic regurgitation  He underwent right and left heart cath in 01/2017 which revealed heavily calcified coronary arteries with mild diffuse nonobstructive CAD, severe aortic valve insufficiency, normal right heart hemodynamics, normal LV systolic function.  An echocardiogram 03/09/17 showed severe LVH, normal LV systolic function with EF 63-78%, grade 1 diastolic dysfunction and moderate aortic regurgitation.    In February 2018 patient was referred to cardiothoracic surgery for evaluation of severe aortic insufficiency with dyspnea on exertion.  He delayed consideration for surgery for several months while he recovered from sepsis.  In 02/2017 Dr. Roxan Hockey repeated an echocardiogram, mainly to ensure that there were no other considerations prior to aortic valve replacement.  Surprisingly, the aortic insufficiency was only moderate.  He was  not having any heart failure type symptoms and Dr. Roxan Hockey felt that there was not sufficient indication for aortic valve replacement at that time.  His hemoglobin keeps dropping and he is planned for colonoscopy and EGD for evalutation. The patient is here today with his brother Jeffrey [name] who is also his caregiver. He has come straight from his home dialysis session and is quite sleepy. He usually takes a nap after dialysis. His BP is elevated. He does not take his BP meds prior to dialysis and he has not taken them yet today. Usually his BP prior to dialysis starts in the 150's-160's and goes down to 130's to 140's with dialysis. He denies hypotension. He deneis chest discomfort, dyspnea, lightheadedness, dizziness, syncope/presyncope. Sometimes he does have some orthopnea on the nights prior to his dialysis days. He adjusts his dry wt based on his breathing. He does not typically have any edema.   His brother does the cannulation for dialysis and works very hard on infection control. He does not want to trigger sepsis again.    Past Medical History:  Diagnosis Date  . Anemia   . Anxiety   . Arthritis   . Asthma    per pt hx  . End stage renal disease on home HD 07/10/2011   Started HD in September 2012 at [facility] with a tunneled HD catheter, now on home HD with N[facility]Stage. Dialyzing through AVF L lower arm with buttonhole technique as of mid 2014. His brother does the HD treatments at home.  They are roommates for 23 years.  The brother works 3rd shift and gets off about 8am and then puts Mr Huot on HD in the morning  after getting home. Most of the time he does HD about 4 times a week, for about 4 hours per treatment. Cause of ESRD was HTN according to patient. He says he let his health go and ending up with complications, and that he didn't like seeing doctors in those days.  He says he was diagnosed with severe HTN when he lived in [place] in his 47's.   . Hepatitis B carrier ([place])   . HIV  infection ([place])   . Hypertension   . Hyperthyroidism    normal now  . Pneumonia several yrs ago  . Seizure ([place])   . Seizures ([place]) 06/02/2011   x 1 none since  . Thrombocytopenia ([place])     Past Surgical History:  Procedure Laterality Date  . AV FISTULA PLACEMENT  06/02/11   Left radiocephalic AVF  . COLONOSCOPY    . fistulaogram     x 2 last 2 years  . IR FLUORO GUIDE CV LINE RIGHT  09/16/2017  . IR US GUIDE VASC ACCESS RIGHT  09/16/2017  . IRRIGATION AND DEBRIDEMENT KNEE Left 09/15/2017   Procedure: IRRIGATION AND DEBRIDEMENT KNEE; arthroscopic clean out;  Surgeon: [name], MD;  Location: [facility];  Service: Orthopedics;  Laterality: Left;  . OTHER SURGICAL HISTORY     removal temporary HD catheter   . REVISON OF ARTERIOVENOUS FISTULA Left 10/10/2015   Procedure: REVISON OF LEFT RADIOCEPHALIC ARTERIOVENOUS FISTULA;  Surgeon: Angelia Mould, MD;  Location: [facility];  Service: Vascular;  Laterality: Left;  . REVISON OF ARTERIOVENOUS FISTULA Left 02/07/2016   Procedure: REPAIR OF PSEUDO-ANEUREYSM OF LEFT ARM  ARTERIOVENOUS FISTULA;  Surgeon: Angelia Mould, MD;  Location: [facility];  Service: Vascular;  Laterality: Left;  . REVISON OF ARTERIOVENOUS FISTULA Left 9/52/8413   Procedure: PLICATION OF LEFT ARM RADIOCEPHALIC ARTERIOVENOUS FISTULA PSEUDOANEURYSM;  Surgeon: Angelia Mould, MD;  Location: [facility];  Service: Vascular;  Laterality: Left;  . RIGHT/LEFT HEART CATH AND CORONARY ANGIOGRAPHY N/A 02/17/2017   Procedure: Right/Left Heart Cath and Coronary Angiography;  Surgeon: Sherren Mocha, MD;  Location: [facility];  Service: Cardiovascular;  Laterality: N/A;  . TEE WITHOUT CARDIOVERSION N/A 09/11/2016   Procedure: TRANSESOPHAGEAL ECHOCARDIOGRAM (TEE);  Surgeon: Dorothy Spark, MD;  Location: [facility] ENDOSCOPY;  Service: Cardiovascular;  Laterality: N/A;    Current Medications: Current Meds  Medication Sig  . albuterol (PROVENTIL) (2.5 MG/3ML) 0.083%  nebulizer solution Take 2.5 mg by nebulization every 6 (six) hours as needed for wheezing or shortness of breath.  . ALPRAZolam (XANAX) 0.25 MG tablet   . aspirin 81 MG chewable tablet Chew 1 tablet (81 mg total) by mouth daily.  . calcium acetate (PHOSLO) 667 MG capsule Take 667-2,001 mg by mouth See admin instructions. 1,334-2,001 mg three times a day with each meal and 667-1,334 mg with each snack  . calcium acetate (PHOSLO) 667 MG capsule calcium acetate 667 mg capsule  TK 3 CS PO TID WC  . carvedilol (COREG) 25 MG tablet Take 1 tablet (25 mg total) by mouth 2 (two) times daily with a meal.  . cloNIDine (CATAPRES - DOSED IN MG/24 HR) 0.1 mg/24hr patch UNWRAP AND APPLY 1 PATCH TO SKIN EVERY 7 DAYS  . clotrimazole-betamethasone (LOTRISONE) cream Apply 1 application topically 2 (two) times daily.  Marland Kitchen doxazosin (CARDURA) 4 MG tablet Take 4 mg by mouth at bedtime.   Marland Kitchen EPIVIR HBV 5 MG/ML solution TAKE 5 ML BY MOUTH EVERY DAY  . epoetin alfa (  EPOGEN,PROCRIT) 2000 UNIT/ML injection Inject 2,000 Units into the vein 3 (three) times a week.  . ethyl chloride spray Apply 1 application topically daily as needed (HD). At HD  . heparin 1000 UNIT/ML injection Inject 3,000 Units into the vein 4 (four) times a week.   . hydrALAZINE (APRESOLINE) 100 MG tablet Take 1 tablet (100 mg total) by mouth 2 (two) times daily.  . ISENTRESS 400 MG tablet TAKE 1 TABLET(400 MG) BY MOUTH TWICE DAILY  . lamivudine (EPIVIR HBV) 5 MG/ML solution TAKE 5 ML BY MOUTH EVERY DAY (Patient taking differently: Take by mouth at bedtime. TAKE 5 ML BY MOUTH EVERY DAY)  . multivitamin (RENA-VIT) TABS tablet Take 1 tablet by mouth daily.    Marland Kitchen tenofovir (VIREAD) 300 MG tablet TAKE 1 TABLET BY MOUTH EVERY SATURDAY AFTER DIALYSIS  . traZODone (DESYREL) 50 MG tablet Take 1 tablet (50 mg total) by mouth at bedtime as needed for sleep.  . VOLTAREN 1 % GEL APPLY 2 GRAMS EXTERNALLY TO THE AFFECTED AREA FOUR TIMES DAILY     Allergies:   Lisinopril  and Penicillins   Social History   Socioeconomic History  . Marital status: Single    Spouse name: Not on file  . Number of children: Not on file  . Years of education: Not on file  . Highest education level: Not on file  Occupational History  . Not on file  Social Needs  . Financial resource strain: Not on file  . Food insecurity:    Worry: Not on file    Inability: Not on file  . Transportation needs:    Medical: Not on file    Non-medical: Not on file  Tobacco Use  . Smoking status: Former Smoker    Years: 10.00    Types: Cigarettes    Last attempt to quit: 08/08/2015    Years since quitting: 2.3  . Smokeless tobacco: Never Used  . Tobacco comment: casual smoking for 10 years  Substance and Sexual Activity  . Alcohol use: No    Alcohol/week: 0.0 oz    Comment: quit 2004  . Drug use: No    Comment: uds (+) cocaine in 08/2011 but pt states was taking sudafed at the time  . Sexual activity: Not Currently    Partners: Male  Lifestyle  . Physical activity:    Days per week: Not on file    Minutes per session: Not on file  . Stress: Not on file  Relationships  . Social connections:    Talks on phone: Not on file    Gets together: Not on file    Attends religious service: Not on file    Active member of club or organization: Not on file    Attends meetings of clubs or organizations: Not on file    Relationship status: Not on file  Other Topics Concern  . 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 ROS:   Please see the history of present illness.     All other systems reviewed and are negative.  EKGs/Labs/Other Studies Reviewed:    The following studies were  reviewed today:  Echocardiogram  03/09/17 Study Conclusions - Left ventricle: The cavity size was normal. Wall thickness was   increased in a pattern of severe LVH. Systolic function was   vigorous. The estimated ejection fraction was in the range of 65%   to 70%. Wall motion was normal; there were no regional wall   motion abnormalities. Doppler parameters are consistent with   abnormal left ventricular relaxation (grade 1 diastolic   dysfunction). - Aortic valve: There was moderate regurgitation.  Right/Left Heart Cath and Coronary Angiography  02/17/17  Conclusion  1. Heavily calcified coronary arteries with mild diffuse nonobstructive CAD 2. Severe aortic valve insufficiency 3. Normal right heart hemodynamics 4. Normal LV systolic function  Recommend: Continued evaluation by Dr. Roxan Hockey for consideration of aortic valve replacement.      EKG:  EKG is ordered today.  The ekg ordered today demonstrates NSR, 1st degree AVB, Left posterior fascicular block. No significant change from previous.   Recent Labs: 10/14/2017: TSH 3.325 11/01/2017: ALT 4; BUN 44; Creat 10.64; Hemoglobin 8.1; Potassium 3.4; Sodium 132 11/15/2017: Platelet Count 156   Recent Lipid Panel    Component Value Date/Time   CHOL 120 11/01/2017 1008   TRIG 106 11/01/2017 1008   HDL 29 (L) 11/01/2017 1008   CHOLHDL 4.1 11/01/2017 1008   VLDL 5 02/10/2017 1619   LDLCALC 72 11/01/2017 1008    Physical Exam:    VS:  BP (!) 180/80 (BP Location: Right Arm, Patient Position: Sitting, Cuff Size: Normal)   Pulse 83   Ht 6' (1.829 m)   SpO2 97%   BMI 26.91 kg/m     Wt Readings from Last 3 Encounters:  11/30/17 198 lb 6.4 oz (90 kg)  11/15/17 199 lb 8 oz (90.5 kg)  11/01/17 203 lb (92.1 kg)     Physical Exam  Constitutional: He is oriented to person, place, and time. He appears well-developed and well-nourished. No distress.  HENT:  Head: Normocephalic and atraumatic.  Neck: Normal range of motion.  Neck supple. No JVD present. Carotid bruit is not present.  Cardiovascular: Normal rate, regular rhythm and intact distal pulses.  Murmur heard.  Blowing early diastolic murmur is present with a grade of 2/6 at the upper right sternal border. Pulmonary/Chest: Effort normal and breath sounds normal. No respiratory distress. He has no wheezes. He has no rales.  Abdominal: Soft. Bowel sounds are normal. He exhibits no distension. There is no tenderness.  Musculoskeletal: Normal range of motion. He exhibits no edema or deformity.  Neurological: He is alert and oriented to person, place, and time.  Skin: Skin is warm and dry.  Psychiatric: He has a normal mood and affect. His behavior is normal. Thought content normal.     ASSESSMENT:    1. Pre-operative clearance   2. Nonrheumatic aortic valve insufficiency   3. Essential hypertension   4. Anemia of chronic kidney failure, unspecified stage   5. End stage renal disease on home HD    PLAN:    In order of problems listed above:  Moderate aortic regurgitation: Was noted to be severe in the past after sepsis/bacteremia.  Followed by Dr. Roxan Hockey of CT surgery and noted to be only moderate at last echocardiogram in 02/2017.  Patient with no signs or symptoms of heart failure.  Appears to be very stable.  Hypertension: Blood pressure elevated today as patient has not had his blood pressure medicines, just completed his dialysis session.  Patient reports good control on current medical regimen.  End-stage  renal disease on home hemodialysis 4 times per week: He is tolerating the home regimen much better than he did at the dialysis center.  He is not having the severe hypotension with his treatments that he once had.  His brother has been trained and does the cannulization.   Anemia of chronic disease: Hemoglobin was down to 6.9 in January and he received 1 unit of PRBCs.  He is on Epogen with dialysis.  Plan for colonoscopy and EGD for further  evaluation.  Cardiac clearance for colonoscopy and EGD: The patient is without any heart failure or ischemic type symptoms.  He is at acceptable risk for the planned procedure without any further cardiac evaluation.   Medication Adjustments/Labs and Tests Ordered: Current medicines are reviewed at length with the patient today.  Concerns regarding medicines are outlined above. Labs and tests ordered and medication changes are outlined in the patient instructions below:  Patient Instructions  Medication Instructions:  Your physician recommends that you continue on your current medications as directed. Please refer to the Current Medication list given to you today.  Labwork: None  Testing/Procedures: None  Follow-Up: Your physician recommends that you schedule a follow-up appointment in: June or July with Dr. Burt Knack.    Any Other Special Instructions Will Be Listed Below (If Applicable).     If you need a refill on your cardiac medications before your next appointment, please call your pharmacy.      Signed, Daune Perch, NP  12/13/2017 1:03 PM    [facility]

## 2017-12-14 ENCOUNTER — Telehealth: Payer: Self-pay

## 2017-12-14 DIAGNOSIS — Z992 Dependence on renal dialysis: Secondary | ICD-10-CM | POA: Diagnosis not present

## 2017-12-14 DIAGNOSIS — I509 Heart failure, unspecified: Secondary | ICD-10-CM | POA: Diagnosis not present

## 2017-12-14 DIAGNOSIS — N186 End stage renal disease: Secondary | ICD-10-CM | POA: Diagnosis not present

## 2017-12-14 DIAGNOSIS — D509 Iron deficiency anemia, unspecified: Secondary | ICD-10-CM | POA: Diagnosis not present

## 2017-12-14 DIAGNOSIS — N2581 Secondary hyperparathyroidism of renal origin: Secondary | ICD-10-CM | POA: Diagnosis not present

## 2017-12-14 DIAGNOSIS — D631 Anemia in chronic kidney disease: Secondary | ICD-10-CM | POA: Diagnosis not present

## 2017-12-14 NOTE — Telephone Encounter (Signed)
LVM for patient callback to advise of clearance.   Cardiac clearance for procedure received from Daune Perch, NP.  The patient is without any heart failure or ischemic type symptoms. He is at acceptable risk for the planned procedure without any further cardiac evaluation.

## 2017-12-16 ENCOUNTER — Encounter: Payer: Self-pay | Admitting: *Deleted

## 2017-12-16 DIAGNOSIS — D631 Anemia in chronic kidney disease: Secondary | ICD-10-CM | POA: Diagnosis not present

## 2017-12-16 DIAGNOSIS — N2581 Secondary hyperparathyroidism of renal origin: Secondary | ICD-10-CM | POA: Diagnosis not present

## 2017-12-16 DIAGNOSIS — Z992 Dependence on renal dialysis: Secondary | ICD-10-CM | POA: Diagnosis not present

## 2017-12-16 DIAGNOSIS — N186 End stage renal disease: Secondary | ICD-10-CM | POA: Diagnosis not present

## 2017-12-16 DIAGNOSIS — D509 Iron deficiency anemia, unspecified: Secondary | ICD-10-CM | POA: Diagnosis not present

## 2017-12-16 DIAGNOSIS — I509 Heart failure, unspecified: Secondary | ICD-10-CM | POA: Diagnosis not present

## 2017-12-17 ENCOUNTER — Encounter: Admission: RE | Payer: Self-pay | Source: Ambulatory Visit

## 2017-12-17 ENCOUNTER — Ambulatory Visit: Admission: RE | Admit: 2017-12-17 | Payer: Medicare Other | Source: Ambulatory Visit | Admitting: Gastroenterology

## 2017-12-17 SURGERY — ESOPHAGOGASTRODUODENOSCOPY (EGD) WITH PROPOFOL
Anesthesia: General

## 2017-12-19 DIAGNOSIS — Z992 Dependence on renal dialysis: Secondary | ICD-10-CM | POA: Diagnosis not present

## 2017-12-19 DIAGNOSIS — N186 End stage renal disease: Secondary | ICD-10-CM | POA: Diagnosis not present

## 2017-12-20 DIAGNOSIS — D631 Anemia in chronic kidney disease: Secondary | ICD-10-CM | POA: Diagnosis not present

## 2017-12-20 DIAGNOSIS — Z992 Dependence on renal dialysis: Secondary | ICD-10-CM | POA: Diagnosis not present

## 2017-12-20 DIAGNOSIS — I120 Hypertensive chronic kidney disease with stage 5 chronic kidney disease or end stage renal disease: Secondary | ICD-10-CM | POA: Diagnosis not present

## 2017-12-20 DIAGNOSIS — N186 End stage renal disease: Secondary | ICD-10-CM | POA: Diagnosis not present

## 2017-12-20 DIAGNOSIS — I509 Heart failure, unspecified: Secondary | ICD-10-CM | POA: Diagnosis not present

## 2017-12-21 ENCOUNTER — Telehealth: Payer: Self-pay

## 2017-12-21 ENCOUNTER — Telehealth: Payer: Self-pay | Admitting: Gastroenterology

## 2017-12-21 DIAGNOSIS — D631 Anemia in chronic kidney disease: Secondary | ICD-10-CM | POA: Diagnosis not present

## 2017-12-21 DIAGNOSIS — I120 Hypertensive chronic kidney disease with stage 5 chronic kidney disease or end stage renal disease: Secondary | ICD-10-CM | POA: Diagnosis not present

## 2017-12-21 DIAGNOSIS — N186 End stage renal disease: Secondary | ICD-10-CM | POA: Diagnosis not present

## 2017-12-21 DIAGNOSIS — Z992 Dependence on renal dialysis: Secondary | ICD-10-CM | POA: Diagnosis not present

## 2017-12-21 DIAGNOSIS — I509 Heart failure, unspecified: Secondary | ICD-10-CM | POA: Diagnosis not present

## 2017-12-21 NOTE — Telephone Encounter (Signed)
MR. [name] LEFT VM REGARDING PR COLONOSCOPY HE STATTES HE WOULD LIKE TO R/S THE PROCEDURE DUE TO PT NOT HAVING BOWEL MOVEMENT AND  ALSO IS ASKING FOR A STRONGER RX FOR NEXT PROCEDURE PLEASE CALL

## 2017-12-21 NOTE — Telephone Encounter (Signed)
LVM for patient callback x 2.   - He had no result with the bowel prep - had to reschedule- lets give him a two day prep with instruction to call if not having stool

## 2017-12-21 NOTE — Telephone Encounter (Signed)
Mr. [name]  Left another vm asking if there is a Pill form that pt could take instead

## 2017-12-22 ENCOUNTER — Other Ambulatory Visit: Payer: Self-pay

## 2017-12-22 ENCOUNTER — Telehealth: Payer: Self-pay | Admitting: Gastroenterology

## 2017-12-22 DIAGNOSIS — D539 Nutritional anemia, unspecified: Secondary | ICD-10-CM

## 2017-12-22 MED ORDER — PEG 3350-KCL-NA BICARB-NACL 420 G PO SOLR
4000.0000 mL | Freq: Once | ORAL | 0 refills | Status: AC
Start: 2017-12-22 — End: 2017-12-22

## 2017-12-22 NOTE — Progress Notes (Signed)
Spoke to patient's caregiver and scheduled colonoscopy.   Sent directions for 2-day prep to MyChart per request.

## 2017-12-22 NOTE — Telephone Encounter (Signed)
LVM for return call from [name] concerning Mr. Applegate's 2-day prep and re-scheduling procedure.

## 2017-12-22 NOTE — Telephone Encounter (Signed)
Anderson Malta from [facility] left vm in regards to pt she would like a call please # [phone] ext [phone]

## 2017-12-23 DIAGNOSIS — I120 Hypertensive chronic kidney disease with stage 5 chronic kidney disease or end stage renal disease: Secondary | ICD-10-CM | POA: Diagnosis not present

## 2017-12-23 DIAGNOSIS — D631 Anemia in chronic kidney disease: Secondary | ICD-10-CM | POA: Diagnosis not present

## 2017-12-23 DIAGNOSIS — I509 Heart failure, unspecified: Secondary | ICD-10-CM | POA: Diagnosis not present

## 2017-12-23 DIAGNOSIS — Z992 Dependence on renal dialysis: Secondary | ICD-10-CM | POA: Diagnosis not present

## 2017-12-23 DIAGNOSIS — N186 End stage renal disease: Secondary | ICD-10-CM | POA: Diagnosis not present

## 2017-12-26 ENCOUNTER — Emergency Department (HOSPITAL_COMMUNITY)
Admission: EM | Admit: 2017-12-26 | Discharge: 2017-12-26 | Disposition: A | Discharge status: Home / Self Care | Payer: Medicare Other | Attending: Emergency Medicine | Admitting: Emergency Medicine

## 2017-12-26 ENCOUNTER — Other Ambulatory Visit: Payer: Self-pay

## 2017-12-26 ENCOUNTER — Encounter (HOSPITAL_COMMUNITY): Payer: Self-pay | Admitting: Emergency Medicine

## 2017-12-26 DIAGNOSIS — N186 End stage renal disease: Secondary | ICD-10-CM | POA: Diagnosis not present

## 2017-12-26 DIAGNOSIS — I120 Hypertensive chronic kidney disease with stage 5 chronic kidney disease or end stage renal disease: Secondary | ICD-10-CM | POA: Insufficient documentation

## 2017-12-26 DIAGNOSIS — Z87891 Personal history of nicotine dependence: Secondary | ICD-10-CM | POA: Diagnosis not present

## 2017-12-26 DIAGNOSIS — R0602 Shortness of breath: Secondary | ICD-10-CM | POA: Diagnosis present

## 2017-12-26 DIAGNOSIS — Z992 Dependence on renal dialysis: Secondary | ICD-10-CM | POA: Insufficient documentation

## 2017-12-26 DIAGNOSIS — Z7982 Long term (current) use of aspirin: Secondary | ICD-10-CM | POA: Diagnosis not present

## 2017-12-26 DIAGNOSIS — J449 Chronic obstructive pulmonary disease, unspecified: Secondary | ICD-10-CM | POA: Insufficient documentation

## 2017-12-26 DIAGNOSIS — Z21 Asymptomatic human immunodeficiency virus [HIV] infection status: Secondary | ICD-10-CM | POA: Diagnosis not present

## 2017-12-26 DIAGNOSIS — R531 Weakness: Secondary | ICD-10-CM | POA: Diagnosis not present

## 2017-12-26 DIAGNOSIS — R0601 Orthopnea: Secondary | ICD-10-CM | POA: Insufficient documentation

## 2017-12-26 LAB — CBC
HEMATOCRIT: 28.2 % — AB (ref 39.0–52.0)
Hemoglobin: 8.9 g/dL — ABNORMAL LOW (ref 13.0–17.0)
MCH: 30.2 pg (ref 26.0–34.0)
MCHC: 31.6 g/dL (ref 30.0–36.0)
MCV: 95.6 fL (ref 78.0–100.0)
PLATELETS: 231 10*3/uL (ref 150–400)
RBC: 2.95 MIL/uL — AB (ref 4.22–5.81)
RDW: 16.2 % — ABNORMAL HIGH (ref 11.5–15.5)
WBC: 6.7 10*3/uL (ref 4.0–10.5)

## 2017-12-26 LAB — BASIC METABOLIC PANEL
ANION GAP: 15 (ref 5–15)
BUN: 56 mg/dL — ABNORMAL HIGH (ref 6–20)
CHLORIDE: 96 mmol/L — AB (ref 101–111)
CO2: 22 mmol/L (ref 22–32)
Calcium: 8.9 mg/dL (ref 8.9–10.3)
Creatinine, Ser: 11.62 mg/dL — ABNORMAL HIGH (ref 0.61–1.24)
GFR calc non Af Amer: 4 mL/min — ABNORMAL LOW (ref >60)
GFR, EST AFRICAN AMERICAN: 5 mL/min — AB (ref >60)
Glucose, Bld: 105 mg/dL — ABNORMAL HIGH (ref 65–99)
POTASSIUM: 3.4 mmol/L — AB (ref 3.5–5.1)
SODIUM: 133 mmol/L — AB (ref 135–145)

## 2017-12-26 NOTE — ED Triage Notes (Signed)
P;t. Stated, hemodialysis at home and my brother helps me. Since yesterday Jeffrey [name] been some SOB, and I think my hgb is low making me weak. Last dialysis was Friday and I always meet my dry weight.

## 2017-12-26 NOTE — ED Notes (Signed)
Per Jeffrey [name], EMT pt stated he does not make urine.

## 2017-12-26 NOTE — ED Provider Notes (Signed)
[place] EMERGENCY DEPARTMENT Provider Note   CSN: [id number] Arrival date & time: 12/26/17  1403  History   Chief Complaint Chief Complaint  Patient presents with  . Shortness of Breath  . Weakness    HPI Jeffrey [name] is a 62 y.o. male presenting with orthopnea. PMH significant for ESRD on home hemodialysis, HIV, HTN, Hep B, anxiety, anemia.   HPI  Patient presents with SOB when lying flat. Began a couple days ago. No difficulty breathing when standing or sitting, even when sitting reclined. He presented because he was worried his hemoglobin might be low and causing his symptoms. He is on home hemodialysis for ESRD. Last home HD treatment 2d ago. Performs 4 home HD treatments per week, but not on any particular schedule. His current weight is under his dry weight. He checks for LE pitting edema with every HD treatment and has not noticed any edema. He denies any chest pain or pain otherwise. Denies cough, fevers. Next nephro appt is April 23.   Past Medical History:  Diagnosis Date  . Anemia   . Anxiety   . Arthritis   . Asthma    per pt hx  . End stage renal disease on home HD 07/10/2011   Started HD in September 2012 at [facility] with a tunneled HD catheter, now on home HD with [facility]. Dialyzing through AVF L lower arm with buttonhole technique as of mid 2014. His brother does the HD treatments at home.  They are roommates for 23 years.  The brother works 3rd shift and gets off about 8am and then puts Jeffrey [name] on HD in the morning after getting home. Most of the time he does HD about 4 times a week, for about 4 hours per treatment. Cause of ESRD was HTN according to patient. He says he let his health go and ending up with complications, and that he didn't like seeing doctors in those days.  He says he was diagnosed with severe HTN when he lived in [place] in his 45's.   . Hepatitis B carrier ([place])   . HIV infection ([place])   . Hypertension   . Hyperthyroidism      normal now  . Pneumonia several yrs ago  . Seizure ([place])   . Seizures ([place]) 06/02/2011   x 1 none since  . Thrombocytopenia [facility])     Patient Active Problem List   Diagnosis Date Noted  . Anemia of chronic kidney failure, unspecified stage   . Infectious joint disease 10/04/2017  . Symptomatic anemia 09/23/2017  . Antibiotic long-term use   . Septic arthritis ([place]) 09/15/2017  . Encounter for incision and drainage procedure 09/15/2017  . Effusion of left knee 09/08/2017  . Intertrigo 07/09/2017  . AC joint arthropathy 06/16/2017  . Pseudoaneurysm of arteriovenous dialysis fistula ([place]) 12/23/2016  . Eczema 12/10/2016  . Bilateral low back pain without sciatica   . Aortic valve insufficiency   . Fall 09/08/2016  . Knee abrasion, right, initial encounter 09/08/2016  . Lumbosacral spondylosis without myelopathy 08/03/2016  . Pulmonary nodules 08/03/2016  . Essential hypertension   . Anemia of chronic disease 04/08/2016  . Secondary hyperparathyroidism ([place]) 02/09/2014  . Asthma 02/09/2014  . Nuclear sclerotic cataract of both eyes 12/12/2013  . Myopia with presbyopia of both eyes 10/26/2013  . COPD (chronic obstructive pulmonary disease) ([place]) 06/27/2013  . Thrombocytopenia ([place]) 01/09/2013  . HBV (hepatitis B virus) infection 01/09/2013  . Anxiety 07/22/2011  . End  stage renal disease on home HD 07/10/2011  . HIV disease ([place]) 06/17/2011    Past Surgical History:  Procedure Laterality Date  . AV FISTULA PLACEMENT  06/02/11   Left radiocephalic AVF  . COLONOSCOPY    . fistulaogram     x 2 last 2 years  . IR FLUORO GUIDE CV LINE RIGHT  09/16/2017  . IR US GUIDE VASC ACCESS RIGHT  09/16/2017  . IRRIGATION AND DEBRIDEMENT KNEE Left 09/15/2017   Procedure: IRRIGATION AND DEBRIDEMENT KNEE; arthroscopic clean out;  Surgeon: [name], MD;  Location: [facility];  Service: Orthopedics;  Laterality: Left;  . OTHER SURGICAL HISTORY     removal temporary HD catheter   .  REVISON OF ARTERIOVENOUS FISTULA Left 10/10/2015   Procedure: REVISON OF LEFT RADIOCEPHALIC ARTERIOVENOUS FISTULA;  Surgeon: Angelia Mould, MD;  Location: [facility];  Service: Vascular;  Laterality: Left;  . REVISON OF ARTERIOVENOUS FISTULA Left 02/07/2016   Procedure: REPAIR OF PSEUDO-ANEUREYSM OF LEFT ARM  ARTERIOVENOUS FISTULA;  Surgeon: Angelia Mould, MD;  Location: [place];  Service: Vascular;  Laterality: Left;  . REVISON OF ARTERIOVENOUS FISTULA Left 0/05/3817   Procedure: PLICATION OF LEFT ARM RADIOCEPHALIC ARTERIOVENOUS FISTULA PSEUDOANEURYSM;  Surgeon: Angelia Mould, MD;  Location: [place] [facility];  Service: Vascular;  Laterality: Left;  . RIGHT/LEFT HEART CATH AND CORONARY ANGIOGRAPHY N/A 02/17/2017   Procedure: Right/Left Heart Cath and Coronary Angiography;  Surgeon: Sherren Mocha, MD;  Location: [place] CV LAB;  Service: Cardiovascular;  Laterality: N/A;  . TEE WITHOUT CARDIOVERSION N/A 09/11/2016   Procedure: TRANSESOPHAGEAL ECHOCARDIOGRAM (TEE);  Surgeon: Dorothy Spark, MD;  Location: [facility] ENDOSCOPY;  Service: Cardiovascular;  Laterality: N/A;        Home Medications    Prior to Admission medications   Medication Sig Start Date End Date Taking? Authorizing Provider  albuterol (PROVENTIL) (2.5 MG/3ML) 0.083% nebulizer solution Take 2.5 mg by nebulization every 6 (six) hours as needed for wheezing or shortness of breath.    [provider]  ALPRAZolam Duanne Moron) 0.25 MG tablet  11/29/17   [provider]  aspirin 81 MG chewable tablet Chew 1 tablet (81 mg total) by mouth daily. 10/15/17   Vaughan Basta, MD  calcium acetate (PHOSLO) 667 MG capsule Take 667-2,001 mg by mouth See admin instructions. 1,334-2,001 mg three times a day with each meal and 667-1,334 mg with each snack 06/06/15   [provider]  calcium acetate (PHOSLO) 667 MG capsule calcium acetate 667 mg capsule  TK 3 CS PO TID WC    [provider]  carvedilol  (COREG) 25 MG tablet Take 1 tablet (25 mg total) by mouth 2 (two) times daily with a meal. 10/05/17   Alphonzo Grieve, MD  cloNIDine (CATAPRES - DOSED IN MG/24 HR) 0.1 mg/24hr patch UNWRAP AND APPLY 1 PATCH TO SKIN EVERY 7 DAYS 11/30/17   Bartholomew Crews, MD  clotrimazole-betamethasone (LOTRISONE) cream Apply 1 application topically 2 (two) times daily. 09/08/17   Campbell Riches, MD  doxazosin (CARDURA) 4 MG tablet Take 4 mg by mouth at bedtime.  07/04/17   [provider]  EPIVIR HBV 5 MG/ML solution TAKE 5 ML BY MOUTH EVERY DAY 12/13/17   Campbell Riches, MD  epoetin alfa (EPOGEN,PROCRIT) 2000 UNIT/ML injection Inject 2,000 Units into the vein 3 (three) times a week.    [provider]  ethyl chloride spray Apply 1 application topically daily as needed (HD). At HD 05/20/15   [provider]  heparin 1000 UNIT/ML injection Inject 3,000 Units into the vein 4 (four) times a week.     [provider]  hydrALAZINE (APRESOLINE) 100 MG tablet Take 100 mg by mouth 3 (three) times daily.    [provider]  ISENTRESS 400 MG tablet TAKE 1 TABLET(400 MG) BY MOUTH TWICE DAILY 10/05/17   Campbell Riches, MD  lamivudine (EPIVIR HBV) 5 MG/ML solution TAKE 5 ML BY MOUTH EVERY DAY Patient taking differently: Take by mouth at bedtime. TAKE 5 ML BY MOUTH EVERY DAY 09/08/17   Campbell Riches, MD  multivitamin (RENA-VIT) TABS tablet Take 1 tablet by mouth daily.      [provider]  tenofovir (VIREAD) 300 MG tablet TAKE 1 TABLET BY MOUTH EVERY SATURDAY AFTER DIALYSIS 10/05/17   Campbell Riches, MD  traZODone (DESYREL) 50 MG tablet Take 1 tablet (50 mg total) by mouth at bedtime as needed for sleep. 11/01/17   Campbell Riches, MD  VOLTAREN 1 % GEL APPLY 2 GRAMS EXTERNALLY TO THE AFFECTED AREA FOUR TIMES DAILY 09/08/17   Campbell Riches, MD    Family History Family History  Problem Relation Age of Onset  . Hypertension Mother   . Diabetes  Mother   . Cancer Father     Social History Social History   Tobacco Use  . Smoking status: Former Smoker    Years: 10.00    Types: Cigarettes    Last attempt to quit: 08/08/2015    Years since quitting: 2.3  . Smokeless tobacco: Never Used  . Tobacco comment: casual smoking for 10 years  Substance Use Topics  . Alcohol use: No    Alcohol/week: 0.0 oz    Comment: quit 2004  . Drug use: No    Comment: uds (+) cocaine in 08/2011 but pt states was taking sudafed at the time     Allergies   Lisinopril and Penicillins   Review of Systems Review of Systems  Constitutional: Negative for fever.  Respiratory: Negative for cough and chest tightness.        Endorses orthopnea  Cardiovascular: Negative for chest pain and leg swelling.  Gastrointestinal: Negative for abdominal pain, nausea and vomiting.  Allergic/Immunologic: Positive for immunocompromised state (HIV, ESRD).  Neurological: Negative for dizziness and weakness.     Physical Exam Updated Vital Signs BP (!) 154/84 (BP Location: Right Arm)   Pulse 72   Temp 97.8 F (36.6 C) (Oral)   Resp 16   Ht 6' (1.829 m)   Wt 89.8 kg (198 lb)   SpO2 100%   BMI 26.85 kg/m   Physical Exam  Constitutional: He is oriented to person, place, and time. He appears well-developed and well-nourished.  Non-toxic appearance. He does not appear ill. No distress.  Well-appearing male sitting up in bed in NAD  HENT:  Head: Normocephalic and atraumatic.  Mouth/Throat: Oropharynx is clear and moist. No oropharyngeal exudate or posterior oropharyngeal edema.  Eyes: Pupils are equal, round, and reactive to light. EOM are normal.  Neck: Normal range of motion. Neck supple.  Cardiovascular: Normal rate, regular rhythm and normal heart sounds.  No murmur heard. Pulmonary/Chest: Effort normal and breath sounds normal. No respiratory distress. He has no wheezes. He has no rhonchi.  Abdominal: Soft. Bowel sounds are normal. He exhibits no  distension. There is no tenderness.  Musculoskeletal: Normal range of motion.       Right lower leg: He exhibits no edema.  Left lower leg: He exhibits no edema.  Neurological: He is alert and oriented to person, place, and time. No cranial nerve deficit.  Skin: Skin is warm and dry.  Psychiatric: He has a normal mood and affect. His behavior is normal.    ED Treatments / Results  Labs (all labs ordered are listed, but only abnormal results are displayed) Labs Reviewed  BASIC METABOLIC PANEL - Abnormal; Notable for the following components:      Result Value   Sodium 133 (*)    Potassium 3.4 (*)    Chloride 96 (*)    Glucose, Bld 105 (*)    BUN 56 (*)    Creatinine, Ser 11.62 (*)    GFR calc non Af Amer 4 (*)    GFR calc Af Amer 5 (*)    All other components within normal limits  CBC - Abnormal; Notable for the following components:   RBC 2.95 (*)    Hemoglobin 8.9 (*)    HCT 28.2 (*)    RDW 16.2 (*)    All other components within normal limits  CBG MONITORING, ED    EKG EKG Interpretation  Date/Time:  Sunday December 26 2017 15:07:47 EDT Ventricular Rate:  75 PR Interval:  202 QRS Duration: 102 QT Interval:  432 QTC Calculation: 482 R Axis:   74 Text Interpretation:  Normal sinus rhythm Biatrial enlargement Prolonged QT Abnormal ECG No significant change since last tracing Confirmed by Addison Lank [phone]) on 12/26/2017 6:29:20 PM   Radiology No results found.  Procedures Procedures (including critical care time)  Medications Ordered in ED Medications - No data to display   Initial Impression / Assessment and Plan / ED Course  I have reviewed the triage vital signs and the nursing notes.  Pertinent labs & imaging results that were available during my care of the patient were reviewed by me and considered in my medical decision making (see chart for details).     Patient presenting with orthopnea, but primarily was concerned that his hemoglobin was  below baseline. Patient reclining in bed with O2 sat 100% on RA and denies any SOB currently. Normal WOB on RA with lungs CTAB. No LE edema or signs of fluid overload, and patient reports he is well below his dry weight. Patient reassured that his hemoglobin is 8.9 today (slightly higher than his baseline), and feels well enough to go home. Stable for discharge. Has nephro f/u in about 2 wks.   Final Clinical Impressions(s) / ED Diagnoses   Final diagnoses:  None    ED Discharge Orders    None     Adin Hector, MD, MPH PGY-3 [facility] Pager [phone]    Verner Mould, MD 12/26/17 1846    Fatima Blank, MD 12/27/17 1600

## 2017-12-26 NOTE — Discharge Instructions (Addendum)
Please follow up with your nephrologist as planned.   If your breathing worsens, please return to the emergency room.

## 2017-12-27 ENCOUNTER — Telehealth: Payer: Self-pay | Admitting: Pharmacist Clinician (PhC)/ Clinical Pharmacy Specialist

## 2017-12-27 DIAGNOSIS — Z992 Dependence on renal dialysis: Secondary | ICD-10-CM | POA: Diagnosis not present

## 2017-12-27 DIAGNOSIS — I120 Hypertensive chronic kidney disease with stage 5 chronic kidney disease or end stage renal disease: Secondary | ICD-10-CM | POA: Diagnosis not present

## 2017-12-27 DIAGNOSIS — D631 Anemia in chronic kidney disease: Secondary | ICD-10-CM | POA: Diagnosis not present

## 2017-12-27 DIAGNOSIS — I509 Heart failure, unspecified: Secondary | ICD-10-CM | POA: Diagnosis not present

## 2017-12-27 DIAGNOSIS — N186 End stage renal disease: Secondary | ICD-10-CM | POA: Diagnosis not present

## 2017-12-27 MED ORDER — BICTEGRAVIR-EMTRICITAB-TENOFOV 50-200-25 MG PO TABS
1.0000 | ORAL_TABLET | Freq: Every day | ORAL | 11 refills | Status: DC
Start: 2017-12-27 — End: 2018-09-05

## 2017-12-27 MED FILL — ETHYL CHLORIDE SPRAY: 25 days supply | Qty: 104 | Fill #2

## 2017-12-27 NOTE — Telephone Encounter (Signed)
Jeffrey [name] advised [name] that the pharmacy told her that lamivudine is on Psychologist, prison and probation services. Jeffrey [name] is currently on Isentress/lamivudine/viread due to his ESRD. D/w with Dr. Johnnye Sima today and we will change his regimen to [facility]. He has SPAP so sent to [facility]. He will come and see pharmacy in 5 wks for repeat labs. He knows to stop the current regimen.

## 2017-12-28 DIAGNOSIS — N186 End stage renal disease: Secondary | ICD-10-CM | POA: Diagnosis not present

## 2017-12-28 DIAGNOSIS — I509 Heart failure, unspecified: Secondary | ICD-10-CM | POA: Diagnosis not present

## 2017-12-28 DIAGNOSIS — Z992 Dependence on renal dialysis: Secondary | ICD-10-CM | POA: Diagnosis not present

## 2017-12-28 DIAGNOSIS — I120 Hypertensive chronic kidney disease with stage 5 chronic kidney disease or end stage renal disease: Secondary | ICD-10-CM | POA: Diagnosis not present

## 2017-12-28 DIAGNOSIS — D631 Anemia in chronic kidney disease: Secondary | ICD-10-CM | POA: Diagnosis not present

## 2017-12-30 DIAGNOSIS — N186 End stage renal disease: Secondary | ICD-10-CM | POA: Diagnosis not present

## 2017-12-30 DIAGNOSIS — D631 Anemia in chronic kidney disease: Secondary | ICD-10-CM | POA: Diagnosis not present

## 2017-12-30 DIAGNOSIS — Z992 Dependence on renal dialysis: Secondary | ICD-10-CM | POA: Diagnosis not present

## 2017-12-30 DIAGNOSIS — I509 Heart failure, unspecified: Secondary | ICD-10-CM | POA: Diagnosis not present

## 2017-12-30 DIAGNOSIS — I120 Hypertensive chronic kidney disease with stage 5 chronic kidney disease or end stage renal disease: Secondary | ICD-10-CM | POA: Diagnosis not present

## 2017-12-31 ENCOUNTER — Encounter: Payer: Self-pay | Admitting: *Deleted

## 2017-12-31 ENCOUNTER — Other Ambulatory Visit: Payer: Self-pay

## 2017-12-31 ENCOUNTER — Ambulatory Visit
Admission: RE | Admit: 2017-12-31 | Discharge: 2017-12-31 | Disposition: A | Discharge status: Home / Self Care | Payer: Medicare Other | Source: Ambulatory Visit | Attending: Gastroenterology | Admitting: Gastroenterology

## 2017-12-31 ENCOUNTER — Encounter
Admission: RE | Disposition: A | Discharge status: Home / Self Care | Payer: Self-pay | Source: Ambulatory Visit | Attending: Gastroenterology

## 2017-12-31 ENCOUNTER — Encounter: Payer: Self-pay | Admitting: Anesthesiology

## 2017-12-31 DIAGNOSIS — Z538 Procedure and treatment not carried out for other reasons: Secondary | ICD-10-CM | POA: Insufficient documentation

## 2017-12-31 DIAGNOSIS — D509 Iron deficiency anemia, unspecified: Secondary | ICD-10-CM | POA: Insufficient documentation

## 2017-12-31 DIAGNOSIS — D539 Nutritional anemia, unspecified: Secondary | ICD-10-CM

## 2017-12-31 SURGERY — ESOPHAGOGASTRODUODENOSCOPY (EGD) WITH PROPOFOL
Anesthesia: General

## 2017-12-31 MED ORDER — GLYCOPYRROLATE 0.2 MG/ML IJ SOLN
INTRAMUSCULAR | Status: AC
  Filled 2017-12-31: qty 1

## 2017-12-31 MED ORDER — PROPOFOL 10 MG/ML IV BOLUS
INTRAVENOUS | Status: AC
  Filled 2017-12-31: qty 20

## 2017-12-31 MED ORDER — LIDOCAINE HCL (PF) 2 % IJ SOLN
INTRAMUSCULAR | Status: AC
  Filled 2017-12-31: qty 10

## 2017-12-31 MED ORDER — SODIUM CHLORIDE 0.9 % IV SOLN: INTRAVENOUS | Status: DC

## 2017-12-31 MED ORDER — PROPOFOL 500 MG/50ML IV EMUL
INTRAVENOUS | Status: AC
  Filled 2017-12-31: qty 50

## 2017-12-31 MED ORDER — PHENYLEPHRINE HCL 10 MG/ML IJ SOLN
INTRAMUSCULAR | Status: AC
  Filled 2017-12-31: qty 1

## 2017-12-31 NOTE — Progress Notes (Signed)
Procedure cancelled due to prep

## 2018-01-03 DIAGNOSIS — I120 Hypertensive chronic kidney disease with stage 5 chronic kidney disease or end stage renal disease: Secondary | ICD-10-CM | POA: Diagnosis not present

## 2018-01-03 DIAGNOSIS — Z992 Dependence on renal dialysis: Secondary | ICD-10-CM | POA: Diagnosis not present

## 2018-01-03 DIAGNOSIS — N186 End stage renal disease: Secondary | ICD-10-CM | POA: Diagnosis not present

## 2018-01-03 DIAGNOSIS — D631 Anemia in chronic kidney disease: Secondary | ICD-10-CM | POA: Diagnosis not present

## 2018-01-03 DIAGNOSIS — I509 Heart failure, unspecified: Secondary | ICD-10-CM | POA: Diagnosis not present

## 2018-01-04 DIAGNOSIS — N186 End stage renal disease: Secondary | ICD-10-CM | POA: Diagnosis not present

## 2018-01-04 DIAGNOSIS — I509 Heart failure, unspecified: Secondary | ICD-10-CM | POA: Diagnosis not present

## 2018-01-04 DIAGNOSIS — D631 Anemia in chronic kidney disease: Secondary | ICD-10-CM | POA: Diagnosis not present

## 2018-01-04 DIAGNOSIS — I120 Hypertensive chronic kidney disease with stage 5 chronic kidney disease or end stage renal disease: Secondary | ICD-10-CM | POA: Diagnosis not present

## 2018-01-04 DIAGNOSIS — Z992 Dependence on renal dialysis: Secondary | ICD-10-CM | POA: Diagnosis not present

## 2018-01-05 ENCOUNTER — Telehealth: Payer: Self-pay | Admitting: Gastroenterology

## 2018-01-05 NOTE — Telephone Encounter (Signed)
Patient LVM to schedule a colonoscopy w/ Dr. Vicente Males.

## 2018-01-06 DIAGNOSIS — I509 Heart failure, unspecified: Secondary | ICD-10-CM | POA: Diagnosis not present

## 2018-01-06 DIAGNOSIS — I120 Hypertensive chronic kidney disease with stage 5 chronic kidney disease or end stage renal disease: Secondary | ICD-10-CM | POA: Diagnosis not present

## 2018-01-06 DIAGNOSIS — N186 End stage renal disease: Secondary | ICD-10-CM | POA: Diagnosis not present

## 2018-01-06 DIAGNOSIS — Z992 Dependence on renal dialysis: Secondary | ICD-10-CM | POA: Diagnosis not present

## 2018-01-06 DIAGNOSIS — D631 Anemia in chronic kidney disease: Secondary | ICD-10-CM | POA: Diagnosis not present

## 2018-01-10 ENCOUNTER — Encounter: Payer: Self-pay | Admitting: Internal Medicine

## 2018-01-10 ENCOUNTER — Encounter: Payer: Medicare Other | Admitting: Internal Medicine

## 2018-01-10 DIAGNOSIS — I509 Heart failure, unspecified: Secondary | ICD-10-CM | POA: Diagnosis not present

## 2018-01-10 DIAGNOSIS — I120 Hypertensive chronic kidney disease with stage 5 chronic kidney disease or end stage renal disease: Secondary | ICD-10-CM | POA: Diagnosis not present

## 2018-01-10 DIAGNOSIS — D631 Anemia in chronic kidney disease: Secondary | ICD-10-CM | POA: Diagnosis not present

## 2018-01-10 DIAGNOSIS — Z992 Dependence on renal dialysis: Secondary | ICD-10-CM | POA: Diagnosis not present

## 2018-01-10 DIAGNOSIS — N186 End stage renal disease: Secondary | ICD-10-CM | POA: Diagnosis not present

## 2018-01-11 ENCOUNTER — Telehealth: Payer: Self-pay | Admitting: Gastroenterology

## 2018-01-11 DIAGNOSIS — I509 Heart failure, unspecified: Secondary | ICD-10-CM | POA: Diagnosis not present

## 2018-01-11 DIAGNOSIS — Z992 Dependence on renal dialysis: Secondary | ICD-10-CM | POA: Diagnosis not present

## 2018-01-11 DIAGNOSIS — I120 Hypertensive chronic kidney disease with stage 5 chronic kidney disease or end stage renal disease: Secondary | ICD-10-CM | POA: Diagnosis not present

## 2018-01-11 DIAGNOSIS — N186 End stage renal disease: Secondary | ICD-10-CM | POA: Diagnosis not present

## 2018-01-11 DIAGNOSIS — D631 Anemia in chronic kidney disease: Secondary | ICD-10-CM | POA: Diagnosis not present

## 2018-01-11 NOTE — Telephone Encounter (Signed)
Patient ready to r/s his colonoscopy. He didn't properly clean out the first time.

## 2018-01-11 NOTE — Telephone Encounter (Signed)
Patient LVM that he is still waiting for someone to call him and schedule his colonoscopy.

## 2018-01-12 ENCOUNTER — Telehealth: Payer: Self-pay | Admitting: Behavioral Health

## 2018-01-12 NOTE — Telephone Encounter (Signed)
Pt called again regarding message left from this morning about his Biktarvy. Spoke with Cassie our pharmacist who was able to speak with the pt and answer his questions Aundria Rud, [place]

## 2018-01-12 NOTE — Telephone Encounter (Signed)
Patient called stating he saw his nephrologist Dr. Holley Raring yesterday and was wondering if his recent lab work was sent to [facility].  Per patient his RBC count was low and Dr. Holley Raring thinks it could be due to taking Biktarvy.  Patient was requesting to speak to Dr. Johnnye Sima personally. Spoke with Cassie Pharmacist in Pharmacy and she was made aware and did not think the [place] was the cause.   Pricilla Riffle RN

## 2018-01-12 NOTE — Telephone Encounter (Signed)
Spoke with patient regarding Biktarvy.  He states his doctor told him his lower RBCs were due to the [place] "being too strong". He suffers from anemia anyway and takes epogen and phoslo. Upon looking, he has had lower RBCs since 2014.  I told him to make sure his doctor looked at all of his records from the last several years and see that his RBCs remain low due to the anemia issue. Told him it was not due to [employer]. He understood and appreciated me talking to him.

## 2018-01-13 ENCOUNTER — Other Ambulatory Visit: Payer: Self-pay

## 2018-01-13 DIAGNOSIS — D631 Anemia in chronic kidney disease: Secondary | ICD-10-CM | POA: Diagnosis not present

## 2018-01-13 DIAGNOSIS — Z992 Dependence on renal dialysis: Secondary | ICD-10-CM | POA: Diagnosis not present

## 2018-01-13 DIAGNOSIS — I120 Hypertensive chronic kidney disease with stage 5 chronic kidney disease or end stage renal disease: Secondary | ICD-10-CM | POA: Diagnosis not present

## 2018-01-13 DIAGNOSIS — I509 Heart failure, unspecified: Secondary | ICD-10-CM | POA: Diagnosis not present

## 2018-01-13 DIAGNOSIS — D649 Anemia, unspecified: Secondary | ICD-10-CM

## 2018-01-13 DIAGNOSIS — N186 End stage renal disease: Secondary | ICD-10-CM | POA: Diagnosis not present

## 2018-01-13 MED ORDER — PEG 3350-KCL-NA BICARB-NACL 420 G PO SOLR: ORAL | 0 refills | Status: DC

## 2018-01-13 NOTE — Telephone Encounter (Signed)
Thank you :)

## 2018-01-13 NOTE — Telephone Encounter (Signed)
Completed by Panya.

## 2018-01-14 ENCOUNTER — Telehealth: Payer: Self-pay | Admitting: *Deleted

## 2018-01-14 NOTE — Telephone Encounter (Signed)
Thank you :)

## 2018-01-14 NOTE — Telephone Encounter (Signed)
Received copy of labs from [facility], Dr Holley Raring.  He is concerned about patient's drop in white blood cells (March = 2.8, April = 1.1). Patient is coming to [facility] 5/16 for pharmacy visit. Placed in Dr Algis Downs box for review. Landis Gandy, RN

## 2018-01-17 DIAGNOSIS — D631 Anemia in chronic kidney disease: Secondary | ICD-10-CM | POA: Diagnosis not present

## 2018-01-17 DIAGNOSIS — Z992 Dependence on renal dialysis: Secondary | ICD-10-CM | POA: Diagnosis not present

## 2018-01-17 DIAGNOSIS — I509 Heart failure, unspecified: Secondary | ICD-10-CM | POA: Diagnosis not present

## 2018-01-17 DIAGNOSIS — I120 Hypertensive chronic kidney disease with stage 5 chronic kidney disease or end stage renal disease: Secondary | ICD-10-CM | POA: Diagnosis not present

## 2018-01-17 DIAGNOSIS — N186 End stage renal disease: Secondary | ICD-10-CM | POA: Diagnosis not present

## 2018-01-18 DIAGNOSIS — I120 Hypertensive chronic kidney disease with stage 5 chronic kidney disease or end stage renal disease: Secondary | ICD-10-CM | POA: Diagnosis not present

## 2018-01-18 DIAGNOSIS — I509 Heart failure, unspecified: Secondary | ICD-10-CM | POA: Diagnosis not present

## 2018-01-18 DIAGNOSIS — D631 Anemia in chronic kidney disease: Secondary | ICD-10-CM | POA: Diagnosis not present

## 2018-01-18 DIAGNOSIS — Z992 Dependence on renal dialysis: Secondary | ICD-10-CM | POA: Diagnosis not present

## 2018-01-18 DIAGNOSIS — N186 End stage renal disease: Secondary | ICD-10-CM | POA: Diagnosis not present

## 2018-01-19 ENCOUNTER — Inpatient Hospital Stay: Payer: Medicare Other | Admitting: Hematology

## 2018-01-19 ENCOUNTER — Inpatient Hospital Stay: Payer: Medicare Other | Attending: Hematology

## 2018-01-20 DIAGNOSIS — D631 Anemia in chronic kidney disease: Secondary | ICD-10-CM | POA: Diagnosis not present

## 2018-01-20 DIAGNOSIS — N186 End stage renal disease: Secondary | ICD-10-CM | POA: Diagnosis not present

## 2018-01-20 DIAGNOSIS — Z992 Dependence on renal dialysis: Secondary | ICD-10-CM | POA: Diagnosis not present

## 2018-01-20 DIAGNOSIS — N2581 Secondary hyperparathyroidism of renal origin: Secondary | ICD-10-CM | POA: Diagnosis not present

## 2018-01-20 DIAGNOSIS — D509 Iron deficiency anemia, unspecified: Secondary | ICD-10-CM | POA: Diagnosis not present

## 2018-01-21 IMAGING — DX DG CHEST 2V
2 series · 2 of 2 positions shown · non-contrast
Comparison: 04/08/2016

CLINICAL DATA: Chest pain for 1 week, history of hypertension

EXAM:
CHEST  2 VIEW

[w chest pa]
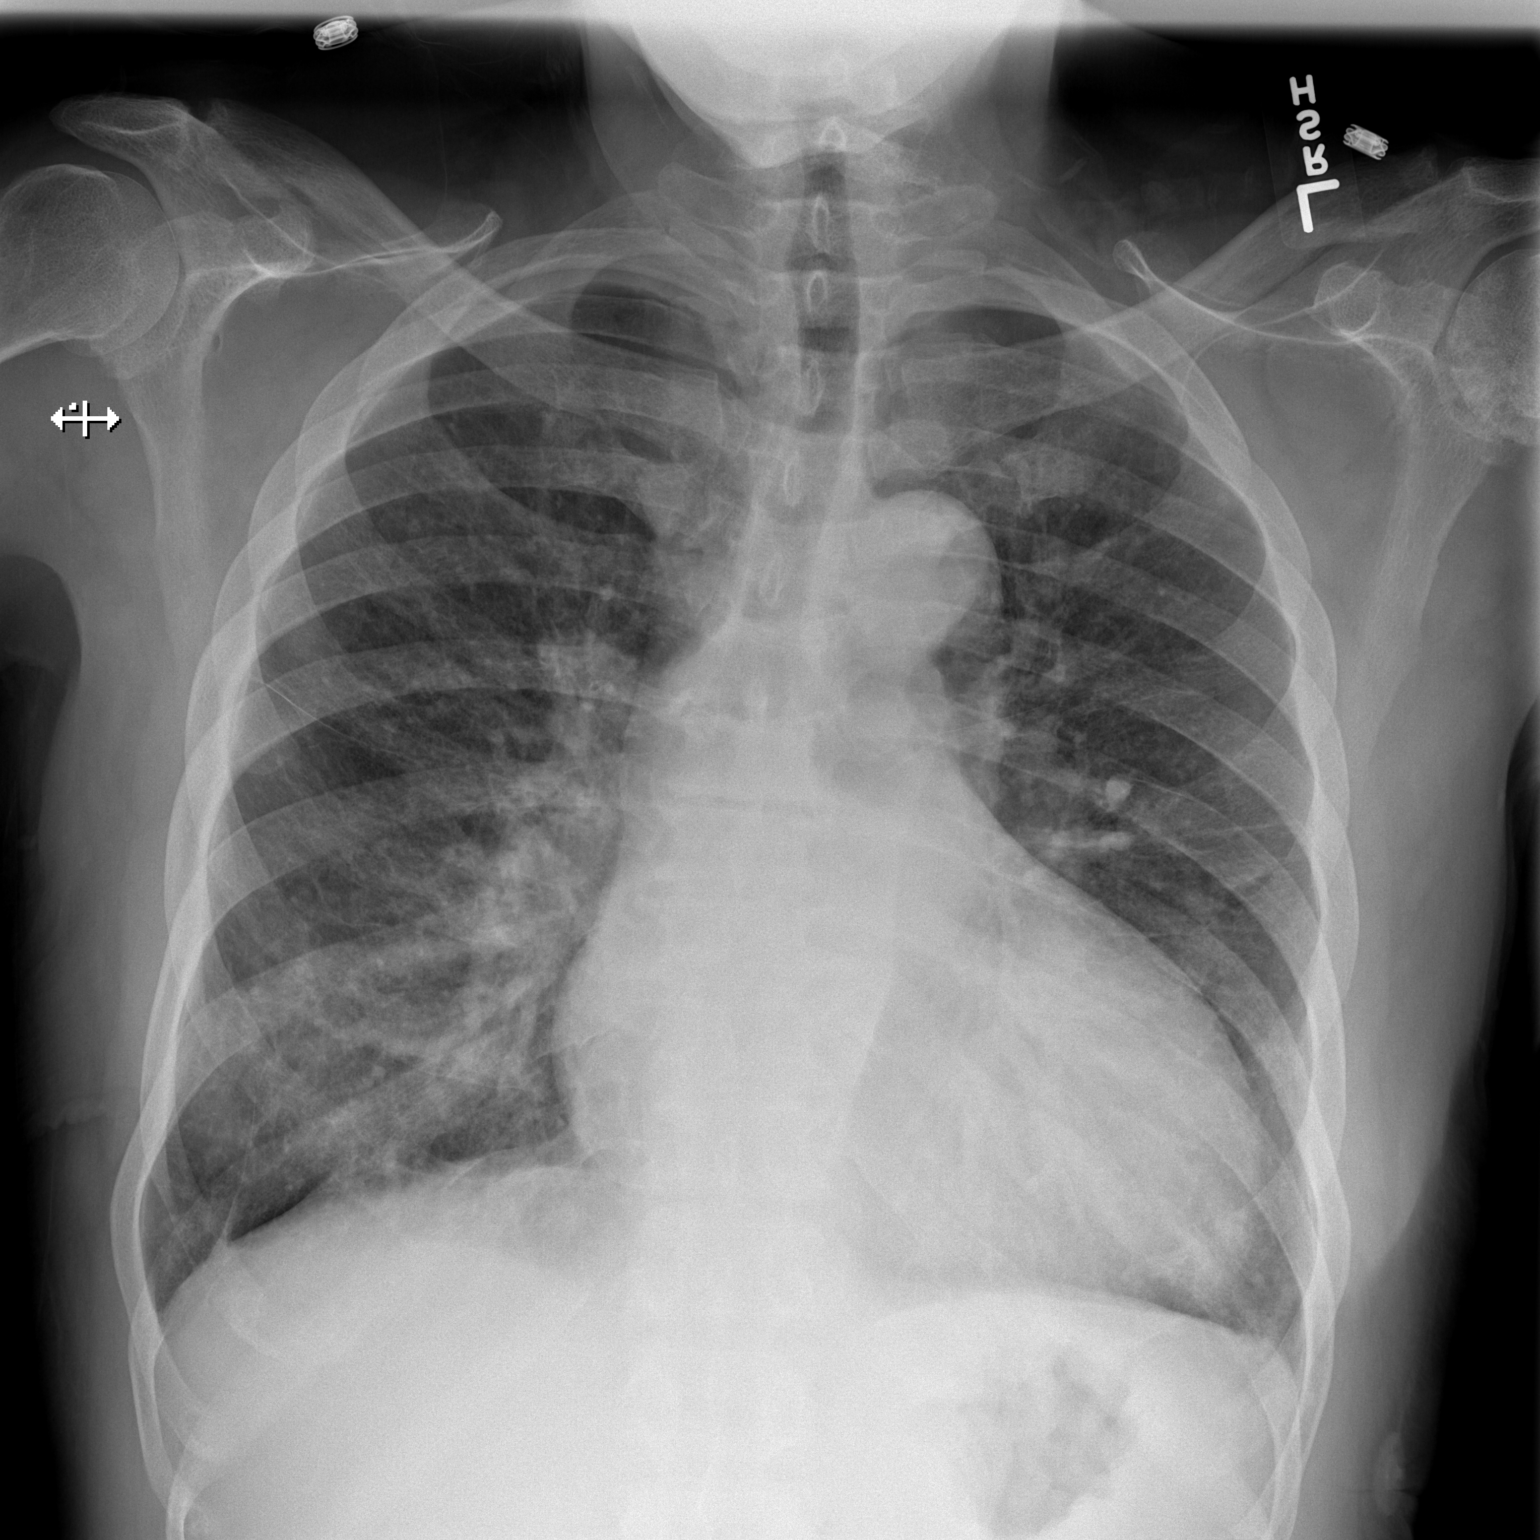

[w chest lat]
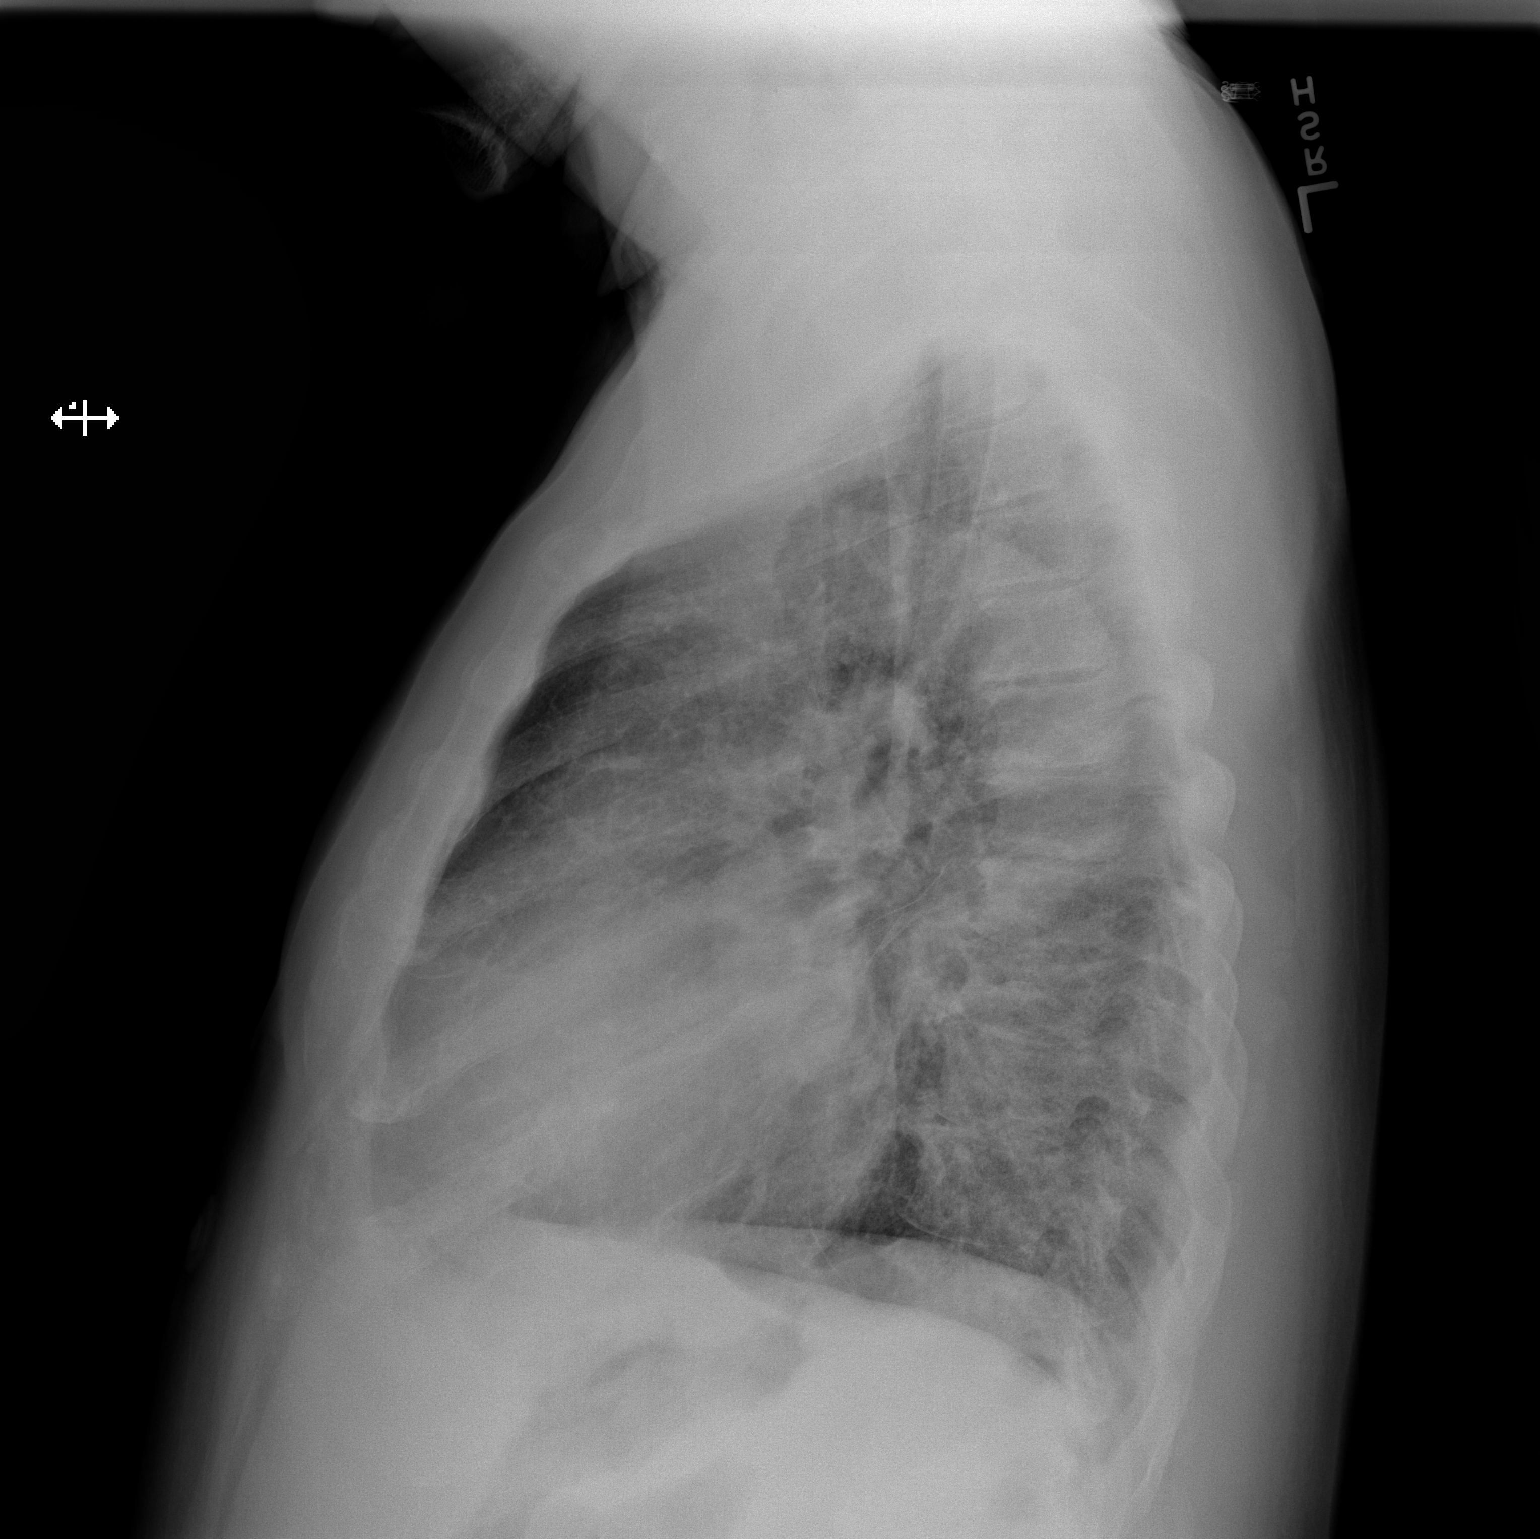

[2 of 2 positions shown; findings below may reference images not displayed]

FINDINGS: Cardiac shadow is enlarged but stable. Stable interstitial changes
are again noted bilaterally. Some chronic markings in the bases
bilaterally greater on the right are seen but stable from previous
exams. No new focal infiltrate is seen. No bony abnormality is
noted.
IMPRESSION: Chronic bibasilar changes likely related to scarring and fibrosis.
No definitive acute infiltrate is seen.

## 2018-01-24 DIAGNOSIS — D631 Anemia in chronic kidney disease: Secondary | ICD-10-CM | POA: Diagnosis not present

## 2018-01-24 DIAGNOSIS — D509 Iron deficiency anemia, unspecified: Secondary | ICD-10-CM | POA: Diagnosis not present

## 2018-01-24 DIAGNOSIS — N186 End stage renal disease: Secondary | ICD-10-CM | POA: Diagnosis not present

## 2018-01-24 DIAGNOSIS — Z992 Dependence on renal dialysis: Secondary | ICD-10-CM | POA: Diagnosis not present

## 2018-01-24 DIAGNOSIS — N2581 Secondary hyperparathyroidism of renal origin: Secondary | ICD-10-CM | POA: Diagnosis not present

## 2018-01-25 DIAGNOSIS — N186 End stage renal disease: Secondary | ICD-10-CM | POA: Diagnosis not present

## 2018-01-25 DIAGNOSIS — D631 Anemia in chronic kidney disease: Secondary | ICD-10-CM | POA: Diagnosis not present

## 2018-01-25 DIAGNOSIS — Z992 Dependence on renal dialysis: Secondary | ICD-10-CM | POA: Diagnosis not present

## 2018-01-25 DIAGNOSIS — N2581 Secondary hyperparathyroidism of renal origin: Secondary | ICD-10-CM | POA: Diagnosis not present

## 2018-01-25 DIAGNOSIS — D509 Iron deficiency anemia, unspecified: Secondary | ICD-10-CM | POA: Diagnosis not present

## 2018-01-26 IMAGING — CR DG CHEST 2V
3 series · 3 of 3 positions shown · non-contrast
Comparison: Chest radiograph performed 07/29/2016

CLINICAL DATA: Subacute onset of cough and mid chest pain. Initial
encounter.

EXAM:
CHEST  2 VIEW

[w chest lat]
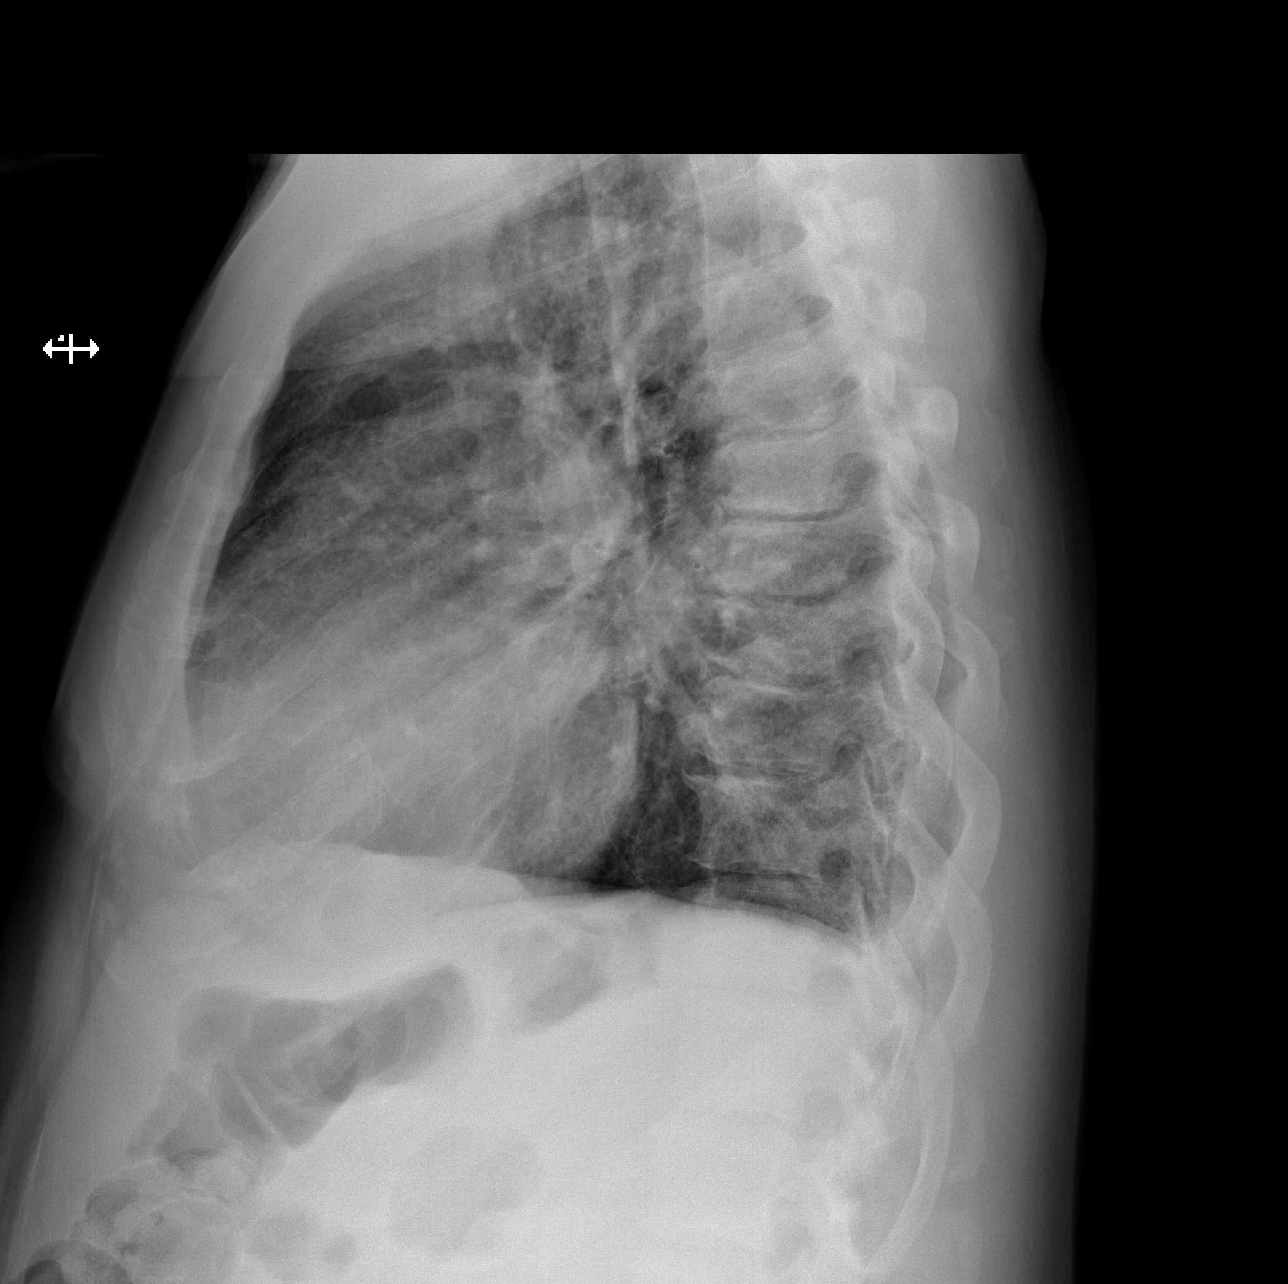

[x chest ap (1 of 2)]
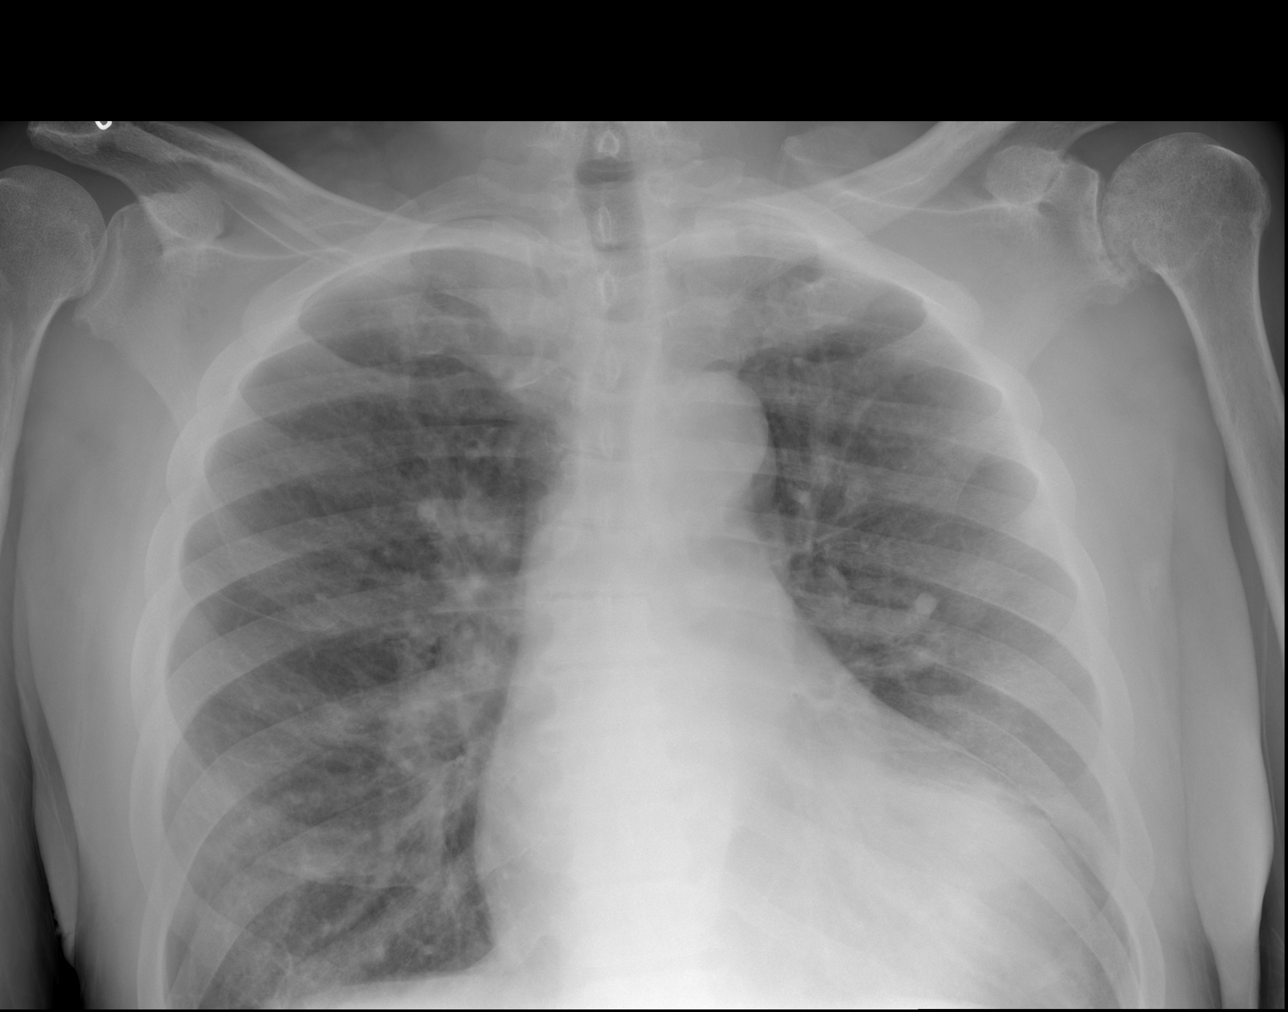

[x chest ap (2 of 2)]
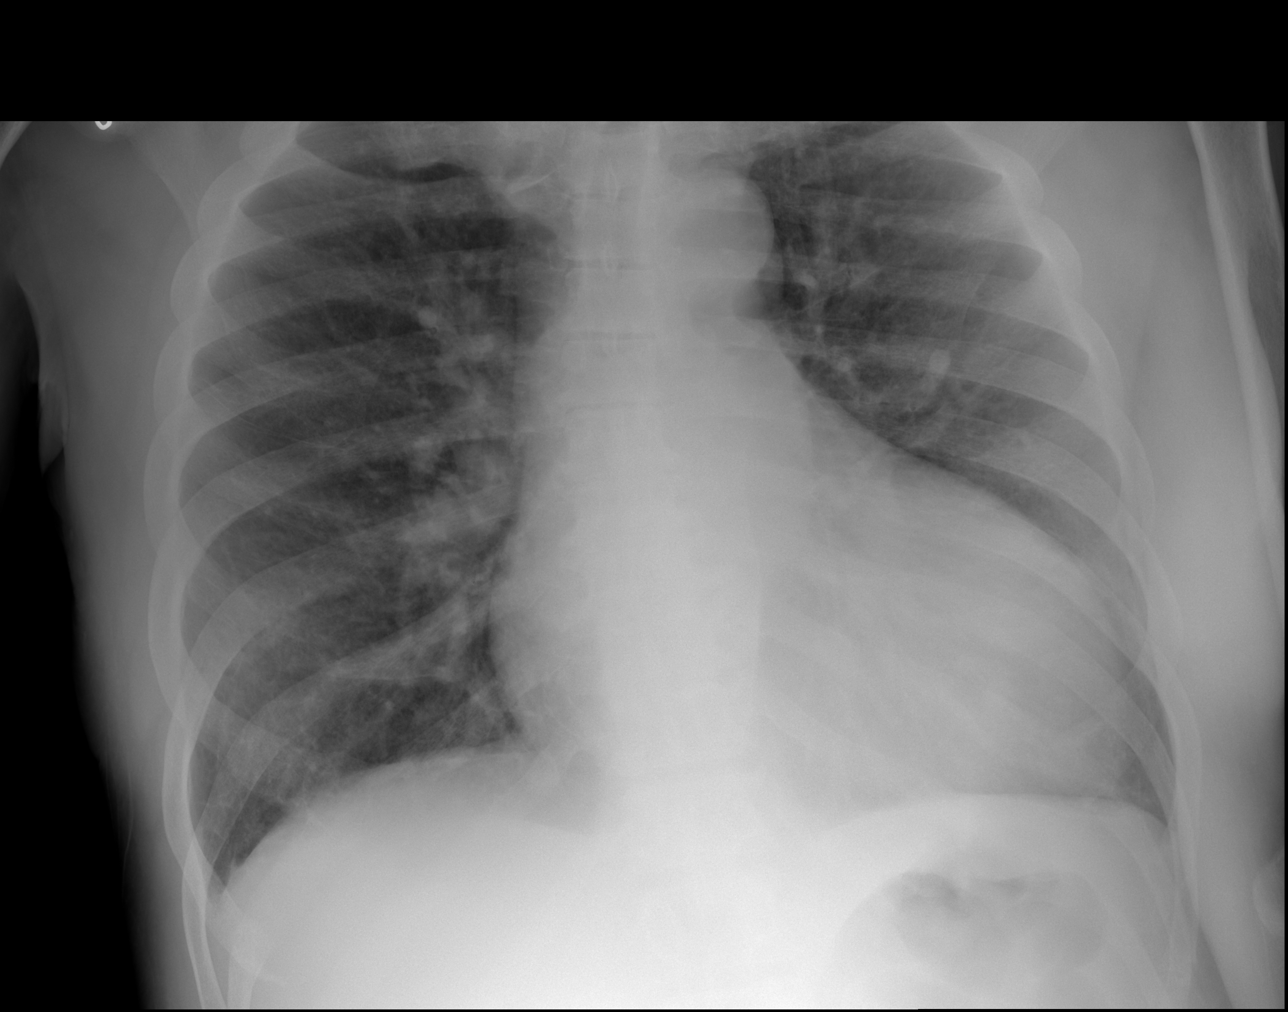

[3 of 3 positions shown; findings below may reference images not displayed]

FINDINGS: The lungs are well-aerated. Vascular congestion is noted. Increased
interstitial markings raise concern for mild pulmonary edema. There
is no evidence of pleural effusion or pneumothorax.

The heart is mildly enlarged. No acute osseous abnormalities are
seen.
IMPRESSION: Vascular congestion and mild cardiomegaly. Increased interstitial
markings raise concern for mild pulmonary edema.

## 2018-01-26 IMAGING — CT CT RENAL STONE PROTOCOL
2 of 3 series · 15 of 46 positions shown, 17 images · non-contrast
Comparison: None.

CLINICAL DATA: Cough for 3 weeks, low back pain. History of HIV,
hepatitis, end-stage renal disease on dialysis, hypertension.

EXAM:
CT ABDOMEN AND PELVIS WITHOUT CONTRAST
TECHNIQUE: Multidetector CT imaging of the abdomen and pelvis was performed
following the standard protocol without IV contrast.

[Series 4: lung · axial · 0.82mm/px · z∈[-152,-54]mm · 12 of 57 slices shown, 14 images]
[im 4/57  soft-tissue]
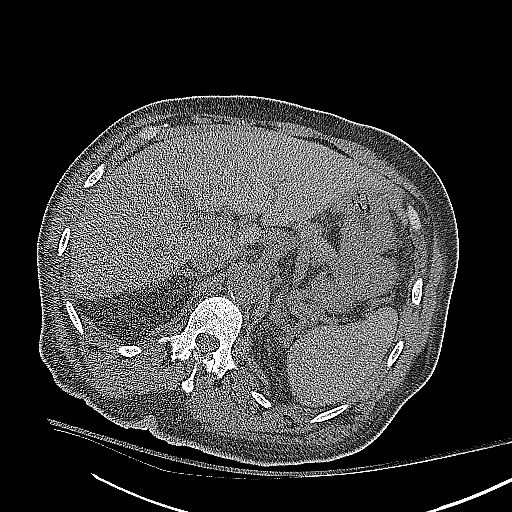
[im 4/57  bone]
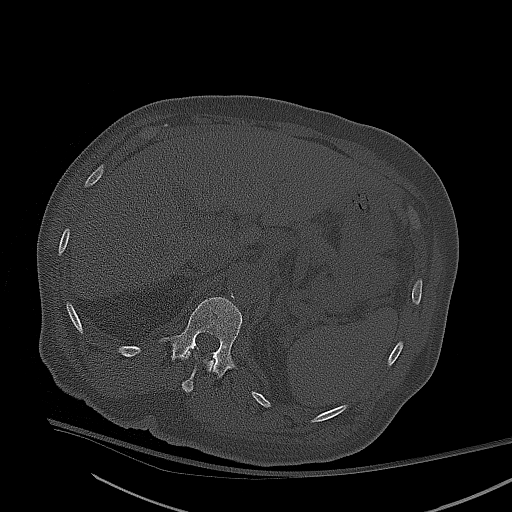
[im 8/57  soft-tissue]
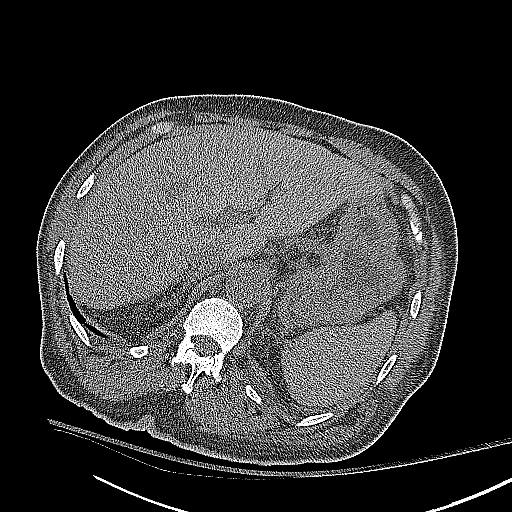
[im 13/57  soft-tissue]
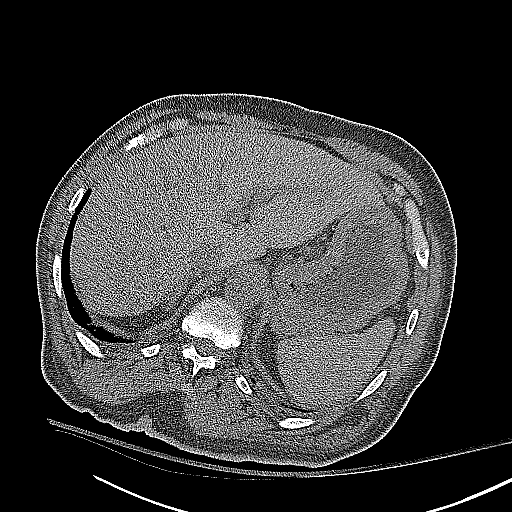
[im 17/57  soft-tissue]
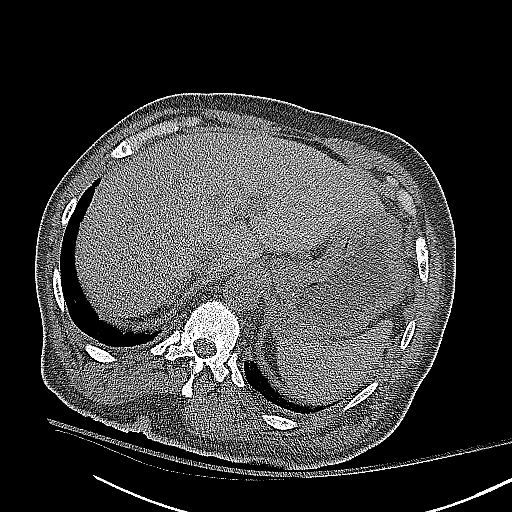
[im 22/57  soft-tissue]
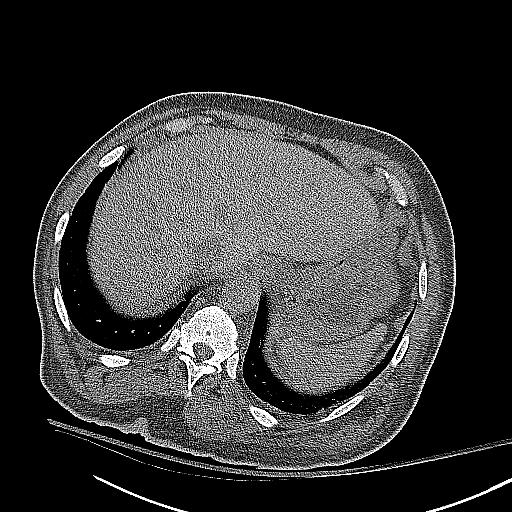
[im 26/57  soft-tissue]
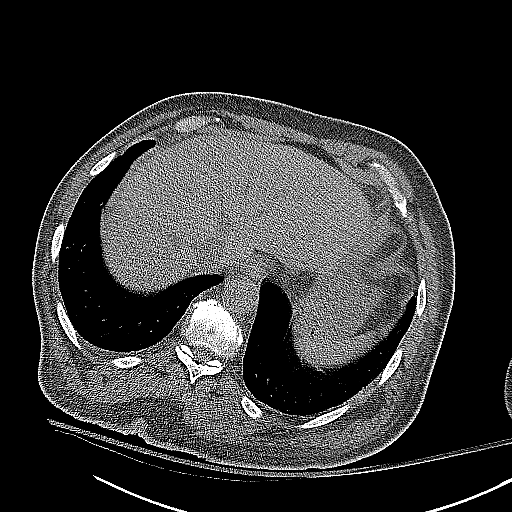
[im 31/57  soft-tissue]
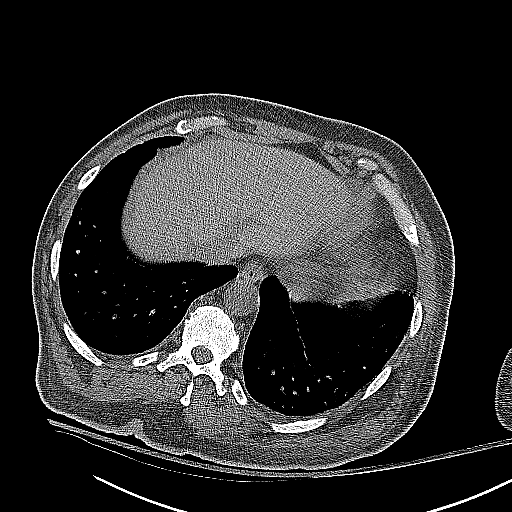
[im 35/57  soft-tissue]
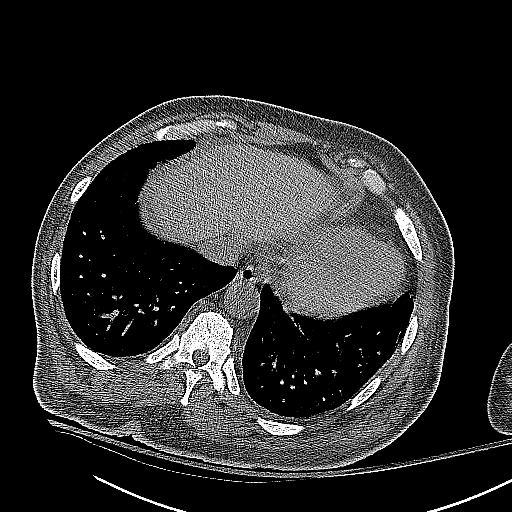
[im 40/57  soft-tissue]
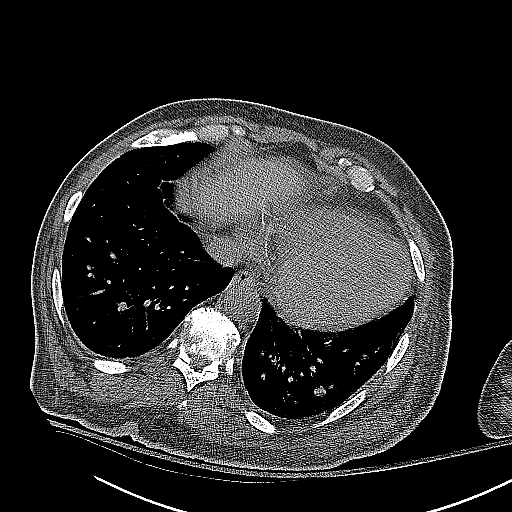
[im 40/57  bone]
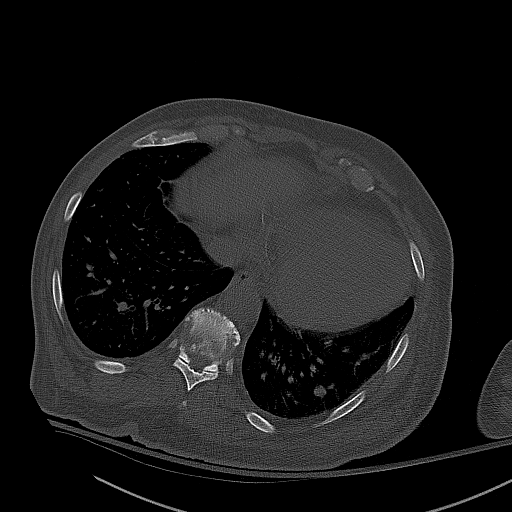
[im 44/57  soft-tissue]
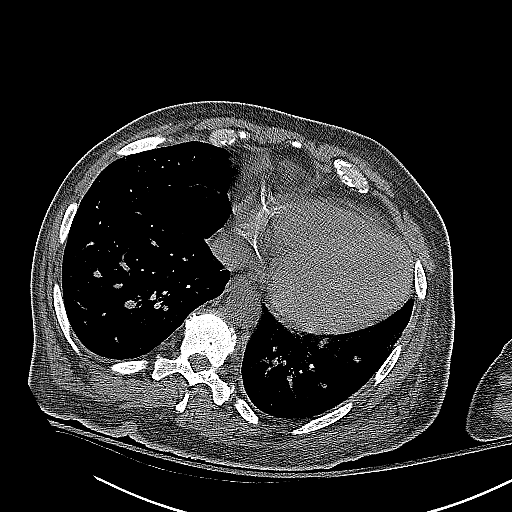
[im 49/57  soft-tissue]
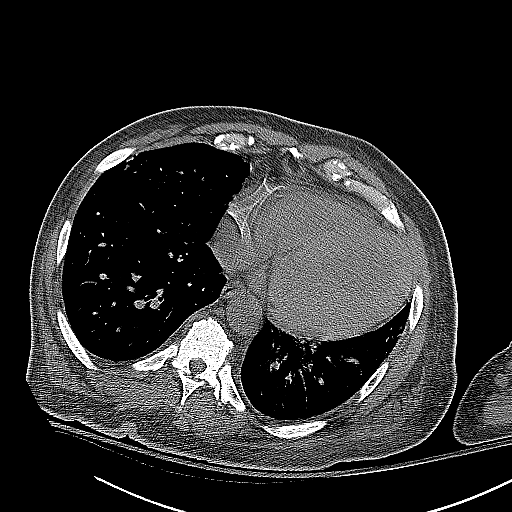
[im 53/57  soft-tissue]
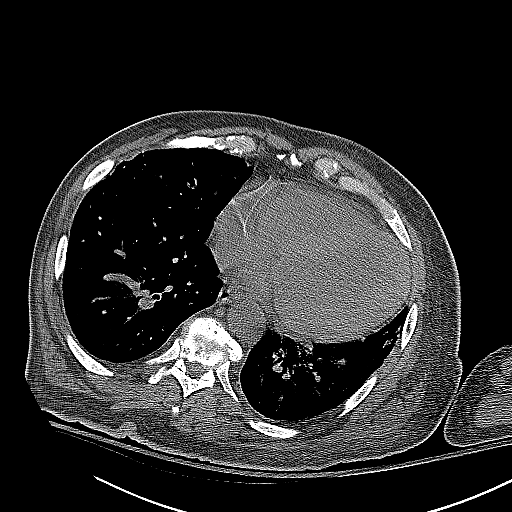

[Series 5: coronal · coronal · 0.74mm/px · 3 of 157 slices shown]
[im 53/157  soft-tissue]
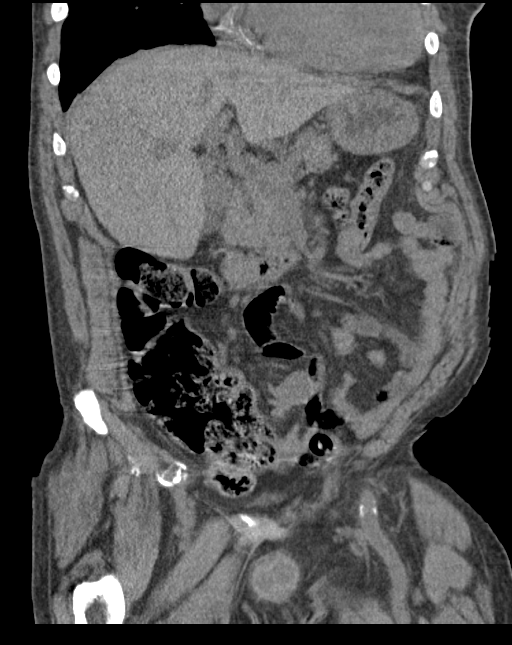
[im 70/157  soft-tissue]
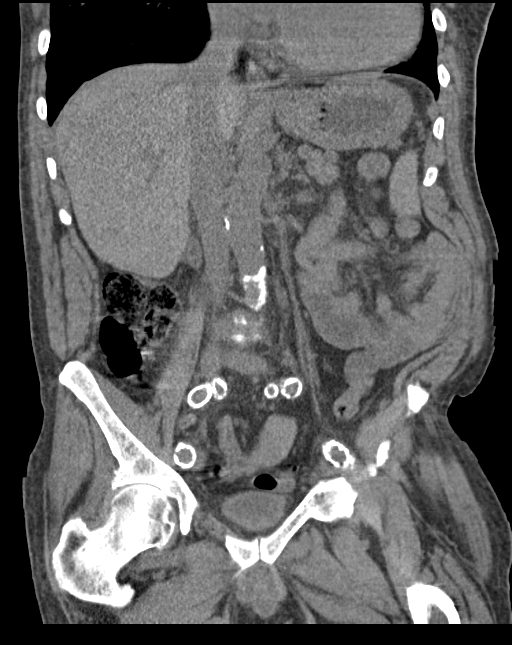
[im 87/157  soft-tissue]
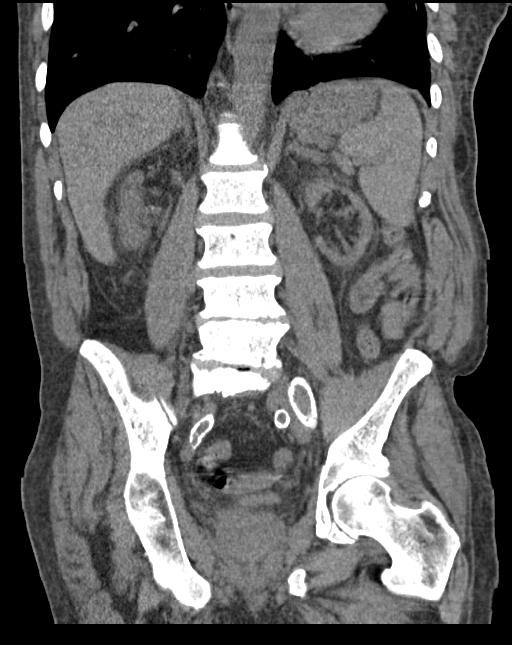

[15 of 46 positions shown; findings below may reference images not displayed]

FINDINGS: LOWER CHEST: Heterogeneous lung attenuation in the bases compatible
with small airway disease. Two 7 mm RIGHT lower lobe pulmonary
nodules (series 4, image 30/57 and series 4, image 25/57). Adjacent
3 mm pulmonary nodule. 8 mm LEFT lower lobe pulmonary nodule (series
4, image 18/57). Heart size is mildly enlarged. No pericardial
effusions.

HEPATOBILIARY: Liver is normal by noncontrast CT appearance. Sub cm
gallstone at the neck without CT findings of acute cholecystitis.

PANCREAS: Normal.

SPLEEN: Normal.

ADRENALS/URINARY TRACT: Kidneys are orthotopic, and symmetrically
atrophic. No nephrolithiasis, hydronephrosis; limited assessment for
renal masses on this nonenhanced examination. The unopacified
ureters are normal in course and caliber. Urinary bladder is
decompressed and unremarkable. Normal adrenal glands.

STOMACH/BOWEL: The stomach, small and large bowel are normal in
course and caliber without inflammatory changes, the sensitivity may
be decreased by lack of enteric contrast. Mild amount of retained
large bowel stool. Normal appendix.

VASCULAR/LYMPHATIC: Aorta is normal course and caliber, severe
calcific atherosclerosis. Ectatic Common iliac arteries with severe
calcific atherosclerosis. No lymphadenopathy by CT size criteria.

REPRODUCTIVE: Normal.

OTHER: No intraperitoneal free fluid or free air.

MUSCULOSKELETAL: Non-acute. Diffusely sclerotic axial skeleton
compatible with renal osteodystrophy. Moderate RIGHT, severe LEFT
hip osteoarthrosis. Severe L4-5 and L5-S1 degenerative discs without
soft tissue component to suggest infection. Severe LEFT L4-5, severe
RIGHT L5-S1 neural foraminal narrowing. No acute osseous process.
IMPRESSION: Symmetrically atrophic kidneys without urolithiasis or obstructive
uropathy. No acute intra-abdominal or pelvic process.

Severe degenerative change of lower lumbar spine resulting in severe
L4-5 and L5-S1 neural foraminal narrowing.

Multiple lung base pulmonary nodules measure up to 8 mm. Recommend
follow-up CT chest at 3-6 months. Heterogeneous lung attenuation
compatible with small airway disease.

Severe atherosclerosis.

## 2018-01-27 ENCOUNTER — Ambulatory Visit
Admission: RE | Admit: 2018-01-27 | Discharge: 2018-01-27 | Disposition: A | Discharge status: Home / Self Care | Payer: Medicare Other | Source: Ambulatory Visit | Attending: Gastroenterology | Admitting: Gastroenterology

## 2018-01-27 ENCOUNTER — Encounter
Admission: RE | Disposition: A | Discharge status: Home / Self Care | Payer: Self-pay | Source: Ambulatory Visit | Attending: Gastroenterology

## 2018-01-27 ENCOUNTER — Ambulatory Visit: Payer: Medicare Other | Admitting: Registered Nurse

## 2018-01-27 ENCOUNTER — Encounter: Payer: Self-pay | Admitting: *Deleted

## 2018-01-27 DIAGNOSIS — N2581 Secondary hyperparathyroidism of renal origin: Secondary | ICD-10-CM | POA: Diagnosis not present

## 2018-01-27 DIAGNOSIS — Z880 Allergy status to penicillin: Secondary | ICD-10-CM | POA: Diagnosis not present

## 2018-01-27 DIAGNOSIS — Z992 Dependence on renal dialysis: Secondary | ICD-10-CM | POA: Insufficient documentation

## 2018-01-27 DIAGNOSIS — Z87891 Personal history of nicotine dependence: Secondary | ICD-10-CM | POA: Diagnosis not present

## 2018-01-27 DIAGNOSIS — Z888 Allergy status to other drugs, medicaments and biological substances status: Secondary | ICD-10-CM | POA: Diagnosis not present

## 2018-01-27 DIAGNOSIS — D372 Neoplasm of uncertain behavior of small intestine: Secondary | ICD-10-CM | POA: Insufficient documentation

## 2018-01-27 DIAGNOSIS — D649 Anemia, unspecified: Secondary | ICD-10-CM | POA: Diagnosis not present

## 2018-01-27 DIAGNOSIS — B9681 Helicobacter pylori [H. pylori] as the cause of diseases classified elsewhere: Secondary | ICD-10-CM | POA: Diagnosis not present

## 2018-01-27 DIAGNOSIS — D125 Benign neoplasm of sigmoid colon: Secondary | ICD-10-CM | POA: Diagnosis not present

## 2018-01-27 DIAGNOSIS — D122 Benign neoplasm of ascending colon: Secondary | ICD-10-CM | POA: Diagnosis not present

## 2018-01-27 DIAGNOSIS — Z7901 Long term (current) use of anticoagulants: Secondary | ICD-10-CM | POA: Diagnosis not present

## 2018-01-27 DIAGNOSIS — Z79899 Other long term (current) drug therapy: Secondary | ICD-10-CM | POA: Insufficient documentation

## 2018-01-27 DIAGNOSIS — N186 End stage renal disease: Secondary | ICD-10-CM | POA: Insufficient documentation

## 2018-01-27 DIAGNOSIS — K3189 Other diseases of stomach and duodenum: Secondary | ICD-10-CM | POA: Diagnosis not present

## 2018-01-27 DIAGNOSIS — K635 Polyp of colon: Secondary | ICD-10-CM | POA: Diagnosis not present

## 2018-01-27 DIAGNOSIS — D509 Iron deficiency anemia, unspecified: Secondary | ICD-10-CM

## 2018-01-27 DIAGNOSIS — D631 Anemia in chronic kidney disease: Secondary | ICD-10-CM | POA: Diagnosis not present

## 2018-01-27 DIAGNOSIS — Z7982 Long term (current) use of aspirin: Secondary | ICD-10-CM | POA: Diagnosis not present

## 2018-01-27 DIAGNOSIS — I120 Hypertensive chronic kidney disease with stage 5 chronic kidney disease or end stage renal disease: Secondary | ICD-10-CM | POA: Insufficient documentation

## 2018-01-27 DIAGNOSIS — K2970 Gastritis, unspecified, without bleeding: Secondary | ICD-10-CM | POA: Diagnosis not present

## 2018-01-27 DIAGNOSIS — D120 Benign neoplasm of cecum: Secondary | ICD-10-CM | POA: Insufficient documentation

## 2018-01-27 DIAGNOSIS — B20 Human immunodeficiency virus [HIV] disease: Secondary | ICD-10-CM | POA: Insufficient documentation

## 2018-01-27 DIAGNOSIS — J449 Chronic obstructive pulmonary disease, unspecified: Secondary | ICD-10-CM | POA: Diagnosis not present

## 2018-01-27 DIAGNOSIS — K621 Rectal polyp: Secondary | ICD-10-CM | POA: Insufficient documentation

## 2018-01-27 DIAGNOSIS — M1990 Unspecified osteoarthritis, unspecified site: Secondary | ICD-10-CM | POA: Insufficient documentation

## 2018-01-27 DIAGNOSIS — D696 Thrombocytopenia, unspecified: Secondary | ICD-10-CM | POA: Diagnosis not present

## 2018-01-27 DIAGNOSIS — F419 Anxiety disorder, unspecified: Secondary | ICD-10-CM | POA: Insufficient documentation

## 2018-01-27 DIAGNOSIS — D126 Benign neoplasm of colon, unspecified: Secondary | ICD-10-CM | POA: Diagnosis not present

## 2018-01-27 DIAGNOSIS — K2900 Acute gastritis without bleeding: Secondary | ICD-10-CM | POA: Diagnosis not present

## 2018-01-27 DIAGNOSIS — D132 Benign neoplasm of duodenum: Secondary | ICD-10-CM | POA: Diagnosis not present

## 2018-01-27 DIAGNOSIS — K649 Unspecified hemorrhoids: Secondary | ICD-10-CM | POA: Diagnosis not present

## 2018-01-27 HISTORY — PX: COLONOSCOPY WITH PROPOFOL: SHX5780

## 2018-01-27 HISTORY — PX: ESOPHAGOGASTRODUODENOSCOPY (EGD) WITH PROPOFOL: SHX5813

## 2018-01-27 SURGERY — COLONOSCOPY WITH PROPOFOL
Anesthesia: General

## 2018-01-27 MED ORDER — SODIUM CHLORIDE 0.9 % IV SOLN
INTRAVENOUS | Status: DC
  Administered 2018-01-27: 500 mL via INTRAVENOUS

## 2018-01-27 MED ORDER — PROPOFOL 500 MG/50ML IV EMUL
INTRAVENOUS | Status: DC | PRN
  Administered 2018-01-27: 150 ug/kg/min via INTRAVENOUS

## 2018-01-27 MED ORDER — PHENYLEPHRINE HCL 10 MG/ML IJ SOLN
INTRAMUSCULAR | Status: DC | PRN
  Administered 2018-01-27: 100 ug via INTRAVENOUS

## 2018-01-27 MED ORDER — MIDAZOLAM HCL 2 MG/2ML IJ SOLN
INTRAMUSCULAR | Status: DC | PRN
  Administered 2018-01-27: 1 mg via INTRAVENOUS

## 2018-01-27 MED ORDER — GLYCOPYRROLATE 0.2 MG/ML IJ SOLN
INTRAMUSCULAR | Status: DC | PRN
  Administered 2018-01-27: 0.2 mg via INTRAVENOUS

## 2018-01-27 MED ORDER — LIDOCAINE HCL (CARDIAC) PF 100 MG/5ML IV SOSY
PREFILLED_SYRINGE | INTRAVENOUS | Status: DC | PRN
Start: 2018-01-27 — End: 2018-01-27
  Administered 2018-01-27: 40 mg via INTRAVENOUS

## 2018-01-27 MED ORDER — PROPOFOL 10 MG/ML IV BOLUS
INTRAVENOUS | Status: DC | PRN
  Administered 2018-01-27: 80 mg via INTRAVENOUS
  Administered 2018-01-27: 20 mg via INTRAVENOUS

## 2018-01-27 MED ORDER — GLYCOPYRROLATE 0.2 MG/ML IJ SOLN
INTRAMUSCULAR | Status: AC
  Filled 2018-01-27: qty 1

## 2018-01-27 MED ORDER — PROPOFOL 10 MG/ML IV BOLUS
INTRAVENOUS | Status: AC
  Filled 2018-01-27: qty 20

## 2018-01-27 MED ORDER — PROPOFOL 500 MG/50ML IV EMUL
INTRAVENOUS | Status: AC
  Filled 2018-01-27: qty 50

## 2018-01-27 MED ORDER — MIDAZOLAM HCL 2 MG/2ML IJ SOLN
INTRAMUSCULAR | Status: AC
  Filled 2018-01-27: qty 2

## 2018-01-27 NOTE — Op Note (Signed)
[facility] Gastroenterology Patient Name: Jeffrey [name] Procedure Date: 01/27/2018 9:03 AM MRN: [id number] Account #: [id number] Date of Birth: 10-11-1955 Admit Type: Outpatient Age: 62 Room: [facility] ENDO ROOM 4 Gender: Male Note Status: Finalized Procedure:            Colonoscopy Indications:          Iron deficiency anemia Providers:            [name] MD, MD Medicines:            Monitored Anesthesia Care Complications:        No immediate complications. Procedure:            Pre-Anesthesia Assessment:                       - Prior to the procedure, a History and Physical was                        performed, and patient medications, allergies and                        sensitivities were reviewed. The patient's tolerance of                        previous anesthesia was reviewed.                       - The risks and benefits of the procedure and the                        sedation options and risks were discussed with the                        patient. All questions were answered and informed                        consent was obtained.                       - ASA Grade Assessment: III - A patient with severe                        systemic disease.                       After obtaining informed consent, the colonoscope was                        passed under direct vision. Throughout the procedure,                        the patient's blood pressure, pulse, and oxygen                        saturations were monitored continuously. The                        Colonoscope was introduced through the anus and                        advanced to the the cecum, identified by the  appendiceal orifice, IC valve and transillumination.                        The colonoscopy was performed with ease. The patient                        tolerated the procedure well. The quality of the bowel                        preparation was poor. Findings:  The perianal and digital rectal examinations were normal.      A 8 mm polyp was found in the sigmoid colon. The polyp was sessile. The       polyp was removed with a hot snare. Resection and retrieval were       complete.      A 25 mm polyp was found in the sigmoid colon. The polyp was       pedunculated. The polyp was removed with a hot snare. Resection and       retrieval were complete. To prevent bleeding after the polypectomy, two       hemostatic clips were successfully placed. There was no bleeding during,       or at the end, of the procedure.      Two sessile polyps were found in the sigmoid colon. The polyps were 5 to       6 mm in size. These polyps were removed with a cold snare. Resection and       retrieval were complete.      Three sessile polyps were found in the ascending colon and cecum. The       polyps were 5 to 7 mm in size. These polyps were removed with a cold       snare. Resection and retrieval were complete.      A 8 mm polyp was found in the anus. The polyp was sessile. Not taken out       as it was over a hemorroid.      The exam was otherwise without abnormality on direct and retroflexion       views. Impression:           - Preparation of the colon was poor.                       - One 8 mm polyp in the sigmoid colon, removed with a                        hot snare. Resected and retrieved.                       - One 25 mm polyp in the sigmoid colon, removed with a                        hot snare. Resected and retrieved. Clips were placed.                       - Two 5 to 6 mm polyps in the sigmoid colon, removed                        with a cold snare. Resected and retrieved.                       -  Three 5 to 7 mm polyps in the ascending colon and in                        the cecum, removed with a cold snare. Resected and                        retrieved.                       - One 8 mm polyp at the anus.                       - The examination was  otherwise normal on direct and                        retroflexion views. Recommendation:       - Discharge patient to home (with escort).                       - Resume previous diet.                       - Continue present medications.                       - Await pathology results.                       - Return to my office in 2 weeks.                       - Refer to surgery to resect polyp over hemorroid Procedure Code(s):    --- Professional ---                       [phone], Colonoscopy, flexible; with removal of tumor(s),                        polyp(s), or other lesion(s) by snare technique Diagnosis Code(s):    --- Professional ---                       D12.5, Benign neoplasm of sigmoid colon                       K62.0, Anal polyp                       D12.2, Benign neoplasm of ascending colon                       D12.0, Benign neoplasm of cecum                       D50.9, Iron deficiency anemia, unspecified CPT copyright 2017 American Medical Association. All rights reserved. The codes documented in this report are preliminary and upon coder review may  be revised to meet current compliance requirements. [name], MD [name] MD, MD 01/27/2018 9:48:43 AM This report has been signed electronically. Number of Addenda: 0 Note Initiated On: 01/27/2018 9:03 AM Scope Withdrawal Time: 0 hours 16 minutes 47 seconds  Total Procedure Duration: 0 hours 24 minutes 10 seconds       [place]  [facility]

## 2018-01-27 NOTE — Anesthesia Preprocedure Evaluation (Signed)
Anesthesia Evaluation  Patient identified by MRN, date of birth, ID band Patient awake    Reviewed: Allergy & Precautions, NPO status , Patient's Chart, lab work & pertinent test results  History of Anesthesia Complications Negative for: history of anesthetic complications  Airway Mallampati: II  TM Distance: >3 FB Neck ROM: Full    Dental  (+) Edentulous Upper, Edentulous Lower   Pulmonary neg shortness of breath, asthma , neg sleep apnea, COPD,  COPD inhaler, neg recent URI, former smoker,           Cardiovascular Exercise Tolerance: Good hypertension, Pt. on medications (-) angina(-) CAD, (-) Past MI, (-) Cardiac Stents and (-) CABG (-) dysrhythmias + Valvular Problems/Murmurs      Neuro/Psych Seizures -, Well Controlled,  PSYCHIATRIC DISORDERS Anxiety    GI/Hepatic negative GI ROS, (+) Hepatitis -, B  Endo/Other  negative endocrine ROS  Renal/GU ESRF and DialysisRenal diseaseDialysis Mon     Musculoskeletal  (+) Arthritis ,   Abdominal   Peds  Hematology  (+) anemia , HIV,   Anesthesia Other Findings Past Medical History: No date: Anemia No date: Anxiety No date: Arthritis No date: Asthma     Comment:  per pt hx 07/10/2011: End stage renal disease on home HD     Comment:  Started HD in September 2012 at [facility] with a tunneled HD               catheter, now on home HD with NxtStage. Dialyzing through              AVF L lower arm with buttonhole technique as of mid 2014.              His brother does the HD treatments at home.  They are               roommates for 23 years.  The brother works 3rd shift and               gets off about 8am and then puts Mr Stockert on HD in the               morning after getting home. Most of the time he does HD               about 4 times a week, for about 4 hours per treatment.               Cause of ESRD was HTN according to patient. He says he               let his health go  and ending up with complications, and               that he didn't like seeing doctors in those days.  He               says he was diagnosed with severe HTN when he lived in               [place] in his 50's.  No date: Hepatitis B carrier ([place]) No date: HIV infection ([place]) No date: Hypertension No date: Hyperthyroidism     Comment:  normal now several yrs ago: Pneumonia No date: Seizure ([place]) 06/02/2011: Seizures ([place])     Comment:  x 1 none since No date: Thrombocytopenia ([place])   Reproductive/Obstetrics  Anesthesia Physical  Anesthesia Plan  ASA: III  Anesthesia Plan: General   Post-op Pain Management:    Induction: Intravenous  PONV Risk Score and Plan: 2 and Propofol infusion  Airway Management Planned: Nasal Cannula  Additional Equipment: None  Intra-op Plan:   Post-operative Plan:   Informed Consent: I have reviewed the patients History and Physical, chart, labs and discussed the procedure including the risks, benefits and alternatives for the proposed anesthesia with the patient or authorized representative who has indicated his/her understanding and acceptance.   Dental advisory given  Plan Discussed with: CRNA and Surgeon  Anesthesia Plan Comments:         Anesthesia Quick Evaluation

## 2018-01-27 NOTE — Op Note (Signed)
[facility] Gastroenterology Patient Name: Jeffrey [name] Procedure Date: 01/27/2018 9:02 AM MRN: [id number] Account #: [id number] Date of Birth: [birth year] Admit Type: Outpatient Age: 62 Room: [facility] ENDO ROOM 4 Gender: Male Note Status: Finalized Procedure:            Upper GI endoscopy Indications:          Iron deficiency anemia Providers:            [name] MD, MD Referring MD:         Tama High, MD (Referring MD) Medicines:            Monitored Anesthesia Care Complications:        No immediate complications. Procedure:            Pre-Anesthesia Assessment:                       - Prior to the procedure, a History and Physical was                        performed, and patient medications, allergies and                        sensitivities were reviewed. The patient's tolerance of                        previous anesthesia was reviewed.                       - The risks and benefits of the procedure and the                        sedation options and risks were discussed with the                        patient. All questions were answered and informed                        consent was obtained.                       - ASA Grade Assessment: III - A patient with severe                        systemic disease.                       After obtaining informed consent, the endoscope was                        passed under direct vision. Throughout the procedure,                        the patient's blood pressure, pulse, and oxygen                        saturations were monitored continuously. The Endoscope                        was introduced through the mouth, and advanced to the  third part of duodenum. The upper GI endoscopy was                        accomplished with ease. The patient tolerated the                        procedure well. Findings:      A large frond-like/villous mass with no bleeding was found in the third   portion of the duodenum. Biopsies were taken with a cold forceps for       histology.      The esophagus was normal.      Localized moderate inflammation characterized by congestion (edema) and       erythema was found in the gastric antrum. Biopsies were taken with a       cold forceps for histology.      The cardia and gastric fundus were normal on retroflexion. Impression:           - Rule out malignancy, duodenal mass. Biopsied.                       - Normal esophagus.                       - Gastritis. Biopsied. Recommendation:       - Await pathology results.                       - 1. Urgent EUS to evaluate mass in duodenum                       - Perform a colonoscopy today. Procedure Code(s):    --- Professional ---                       [phone], Esophagogastroduodenoscopy, flexible, transoral;                        with biopsy, single or multiple Diagnosis Code(s):    --- Professional ---                       K31.89, Other diseases of stomach and duodenum                       K29.70, Gastritis, unspecified, without bleeding                       D50.9, Iron deficiency anemia, unspecified CPT copyright 2017 American Medical Association. All rights reserved. The codes documented in this report are preliminary and upon coder review may  be revised to meet current compliance requirements. [name], MD [name] MD, MD 01/27/2018 9:17:52 AM This report has been signed electronically. Number of Addenda: 0 Note Initiated On: 01/27/2018 9:02 AM      [facility]

## 2018-01-27 NOTE — H&P (Signed)
Jeffrey Bellows, MD [address], [place], [place], [place], [address] [address], [place], [place], [place], [address] Phone: [phone]  Fax: [phone]  Primary Care Physician:  Jeffrey Ochs, MD   Pre-Procedure History & Physical: HPI:  Jeffrey [name] is a 62 y.o. male is here for an endoscopy and colonoscopy    Past Medical History:  Diagnosis Date  . Anemia   . Anxiety   . Arthritis   . Asthma    per pt hx  . End stage renal disease on home HD 07/10/2011   Started HD in September 2012 at [facility] with a tunneled HD catheter, now on home HD with NxtStage. Dialyzing through AVF L lower arm with buttonhole technique as of mid 2014. His brother does the HD treatments at home.  They are roommates for 23 years.  The brother works 3rd shift and gets off about 8am and then puts Jeffrey [name] on HD in the morning after getting home. Most of the time he does HD about 4 times a week, for about 4 hours per treatment. Cause of ESRD was HTN according to patient. He says he let his health go and ending up with complications, and that he didn't like seeing doctors in those days.  He says he was diagnosed with severe HTN when he lived in [place] in his 36's.   . Hepatitis B carrier ([place])   . HIV infection ([place])   . Hypertension   . Hyperthyroidism    normal now  . Pneumonia several yrs ago  . Seizure ([place])   . Seizures ([place]) 06/02/2011   x 1 none since  . Thrombocytopenia ([place])     Past Surgical History:  Procedure Laterality Date  . AV FISTULA PLACEMENT  06/02/11   Left radiocephalic AVF  . COLONOSCOPY    . fistulaogram     x 2 last 2 years  . IR FLUORO GUIDE CV LINE RIGHT  09/16/2017  . IR US GUIDE VASC ACCESS RIGHT  09/16/2017  . IRRIGATION AND DEBRIDEMENT KNEE Left 09/15/2017   Procedure: IRRIGATION AND DEBRIDEMENT KNEE; arthroscopic clean out;  Surgeon: Jeffrey Maudlin, MD;  Location: [facility];  Service: Orthopedics;  Laterality: Left;  . OTHER SURGICAL HISTORY     removal temporary HD catheter   . REVISON OF ARTERIOVENOUS FISTULA Left 10/10/2015   Procedure: REVISON OF LEFT RADIOCEPHALIC ARTERIOVENOUS FISTULA;  Surgeon: Angelia Mould, MD;  Location: [place];  Service: Vascular;  Laterality: Left;  . REVISON OF ARTERIOVENOUS FISTULA Left 02/07/2016   Procedure: REPAIR OF PSEUDO-ANEUREYSM OF LEFT ARM  ARTERIOVENOUS FISTULA;  Surgeon: Angelia Mould, MD;  Location: [place];  Service: Vascular;  Laterality: Left;  . REVISON OF ARTERIOVENOUS FISTULA Left 8/92/1194   Procedure: PLICATION OF LEFT ARM RADIOCEPHALIC ARTERIOVENOUS FISTULA PSEUDOANEURYSM;  Surgeon: Angelia Mould, MD;  Location: [facility];  Service: Vascular;  Laterality: Left;  . RIGHT/LEFT HEART CATH AND CORONARY ANGIOGRAPHY N/A 02/17/2017   Procedure: Right/Left Heart Cath and Coronary Angiography;  Surgeon: Jeffrey Mocha, MD;  Location: [facility] CV [facility];  Service: Cardiovascular;  Laterality: N/A;  . TEE WITHOUT CARDIOVERSION N/A 09/11/2016   Procedure: TRANSESOPHAGEAL ECHOCARDIOGRAM (TEE);  Surgeon: Jeffrey Spark, MD;  Location: [facility];  Service: Cardiovascular;  Laterality: N/A;    Prior to Admission medications   Medication Sig Start Date End Date Taking? Authorizing Provider  albuterol (PROVENTIL) (2.5 MG/3ML) 0.083% nebulizer solution Take 2.5 mg by nebulization every 6 (six) hours  as needed for wheezing or shortness of breath.    [provider]  ALPRAZolam Duanne Moron) 0.25 MG tablet  11/29/17   [provider]  aspirin 81 MG chewable tablet Chew 1 tablet (81 mg total) by mouth daily. 10/15/17   Jeffrey Basta, MD  bictegravir-emtricitabine-tenofovir AF (BIKTARVY) 50-200-25 MG TABS tablet Take 1 tablet by mouth daily. 12/27/17   Jeffrey Riches, MD  calcium acetate (PHOSLO) 667 MG capsule Take 667-2,001 mg by mouth See admin instructions. 1,334-2,001 mg three times a day with each meal and 667-1,334 mg with each snack 06/06/15   [provider]  calcium acetate (PHOSLO) 667 MG capsule calcium acetate 667 mg capsule  TK 3 CS PO TID WC    [provider]  carvedilol (COREG) 25 MG tablet Take 1 tablet (25 mg total) by mouth 2 (two) times daily with a meal. 10/05/17   Jeffrey Grieve, MD  cloNIDine (CATAPRES - DOSED IN MG/24 HR) 0.1 mg/24hr patch UNWRAP AND APPLY 1 PATCH TO SKIN EVERY 7 DAYS 11/30/17   Jeffrey Crews, MD  clotrimazole-betamethasone (LOTRISONE) cream Apply 1 application topically 2 (two) times daily. 09/08/17   Jeffrey Riches, MD  doxazosin (CARDURA) 4 MG tablet Take 4 mg by mouth at bedtime.  07/04/17   [provider]  epoetin alfa (EPOGEN,PROCRIT) 2000 UNIT/ML injection Inject 2,000 Units into the vein 3 (three) times a week.    [provider]  ethyl chloride spray Apply 1 application topically daily as needed (HD). At HD 05/20/15   [provider]  heparin 1000 UNIT/ML injection Inject 3,000 Units into the vein 4 (four) times a week.     [provider]  hydrALAZINE (APRESOLINE) 100 MG tablet Take 100 mg by mouth 3 (three) times daily.    [provider]  multivitamin (RENA-VIT) TABS tablet Take 1 tablet by mouth daily.      [provider]  polyethylene glycol-electrolytes (NULYTELY/GOLYTELY) 420 g solution TK 4000 ML PO ONCE FOR 1 DOSE 01/13/18   Jeffrey Bellows, MD  tenofovir Veva Holes) 300 MG tablet  01/05/18   [provider]  traZODone (DESYREL) 50 MG tablet Take 1 tablet (50 mg total) by mouth at bedtime as needed for sleep. 11/01/17   Jeffrey Riches, MD  VOLTAREN 1 % GEL APPLY 2 GRAMS EXTERNALLY TO THE AFFECTED AREA FOUR TIMES DAILY Patient not taking: Reported on 12/31/2017 09/08/17   Jeffrey Riches, MD    Allergies as of 01/13/2018 - Review Complete 12/31/2017  Allergen Reaction Noted  . Lisinopril Anaphylaxis and Shortness Of Breath 08/31/2012  . Penicillins Other (See Comments)     Family History  Problem  Relation Age of Onset  . Hypertension Mother   . Diabetes Mother   . Cancer Father     Social History   Socioeconomic History  . Marital status: Single    Spouse name: Not on file  . Number of children: Not on file  . Years of education: Not on file  . Highest education level: Not on file  Occupational History  . Not on file  Social Needs  . Financial resource strain: Not on file  . Food insecurity:    Worry: Not on file    Inability: Not on file  . Transportation needs:    Medical: Not on file    Non-medical: Not on file  Tobacco Use  . Smoking status: Former Smoker    Years: 10.00    Types: Cigarettes  Last attempt to quit: 08/08/2015    Years since quitting: 2.4  . Smokeless tobacco: Never Used  . Tobacco comment: casual smoking for 10 years  Substance and Sexual Activity  . Alcohol use: No    Alcohol/week: 0.0 oz    Comment: quit 2004  . Drug use: No    Comment: uds (+) cocaine in 08/2011 but pt states was taking sudafed at the time  . Sexual activity: Not Currently    Partners: Male  Lifestyle  . Physical activity:    Days per week: Not on file    Minutes per session: Not on file  . Stress: Not on file  Relationships  . Social connections:    Talks on phone: Not on file    Gets together: Not on file    Attends religious service: Not on file    Active member of club or organization: Not on file    Attends meetings of clubs or organizations: Not on file    Relationship status: Not on file  . Intimate partner violence:    Fear of current or ex partner: Not on file    Emotionally abused: Not on file    Physically abused: Not on file    Forced sexual activity: Not on file  Other Topics Concern  . 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 General:   Alert,  pleasant and cooperative in NAD Head:  Normocephalic and atraumatic. Neck:  Supple; no masses or thyromegaly. Lungs:  Clear throughout to auscultation, normal respiratory effort.    Heart:  +S1, +S2, Regular rate and rhythm, No edema. Abdomen:  Soft, nontender and nondistended. Normal bowel sounds, without guarding, and without rebound.   Neurologic:  Alert and  oriented x4;  grossly normal neurologically.  Impression/Plan: Jeffrey [name] is here for an endoscopy and colonoscopy  to be performed for  evaluation of iron deficiency anemia    Risks, benefits, limitations, and alternatives regarding endoscopy have been reviewed with the patient.  Questions have been answered.  All parties agreeable.   Jeffrey Bellows, MD  01/27/2018, 8:03 AM

## 2018-01-27 NOTE — Anesthesia Postprocedure Evaluation (Signed)
Anesthesia Post Note  Patient: Jeffrey [name]  Procedure(s) Performed: COLONOSCOPY WITH PROPOFOL (N/A ) ESOPHAGOGASTRODUODENOSCOPY (EGD) WITH PROPOFOL (N/A )  Patient location during evaluation: Endoscopy Anesthesia Type: General Level of consciousness: awake and alert Pain management: pain level controlled Vital Signs Assessment: post-procedure vital signs reviewed and stable Respiratory status: spontaneous breathing, nonlabored ventilation, respiratory function stable and patient connected to nasal cannula oxygen Cardiovascular status: blood pressure returned to baseline and stable Postop Assessment: no apparent nausea or vomiting Anesthetic complications: no     Last Vitals:  Vitals:   01/27/18 1011 01/27/18 1021  BP: (!) 152/84 (!) 176/78  Pulse: 78 74  Resp: 19 17  Temp:    SpO2: 100% 100%    Last Pain:  Vitals:   01/27/18 1021  TempSrc:   PainSc: 0-No pain                 Martha Clan

## 2018-01-27 NOTE — Anesthesia Procedure Notes (Signed)
Date/Time: 01/27/2018 9:45 AM Performed by: Doreen Salvage, CRNA Pre-anesthesia Checklist: Patient identified, Emergency Drugs available, Suction available and Patient being monitored Patient Re-evaluated:Patient Re-evaluated prior to induction Oxygen Delivery Method: Nasal cannula Induction Type: IV induction Dental Injury: Teeth and Oropharynx as per pre-operative assessment  Comments: Nasal cannula with etCO2 monitoring

## 2018-01-27 NOTE — H&P (View-Only) (Signed)
Jeffrey Bellows, MD [address] [place], [place], [place], [address] [address], [place], [place], [place], [address] Phone: [phone]  Fax: [phone]  Primary Care Physician:  Velna Ochs, MD   Pre-Procedure History & Physical: HPI:  Jeffrey [name] is a 62 y.o. male is here for an endoscopy and colonoscopy    Past Medical History:  Diagnosis Date  . Anemia   . Anxiety   . Arthritis   . Asthma    per pt hx  . End stage renal disease on home HD 07/10/2011   Started HD in September 2012 at [facility] with a tunneled HD catheter, now on home HD with NxtStage. Dialyzing through AVF L lower arm with buttonhole technique as of mid 2014. His brother does the HD treatments at home.  They are roommates for 23 years.  The brother works 3rd shift and gets off about 8am and then puts Jeffrey [name] on HD in the morning after getting home. Most of the time he does HD about 4 times a week, for about 4 hours per treatment. Cause of ESRD was HTN according to patient. He says he let his health go and ending up with complications, and that he didn't like seeing doctors in those days.  He says he was diagnosed with severe HTN when he lived in [place] in his 40's.   . Hepatitis B carrier ([place])   . HIV infection ([place])   . Hypertension   . Hyperthyroidism    normal now  . Pneumonia several yrs ago  . Seizure ([place])   . Seizures ([place]) 06/02/2011   x 1 none since  . Thrombocytopenia ([place])     Past Surgical History:  Procedure Laterality Date  . AV FISTULA PLACEMENT  06/02/11   Left radiocephalic AVF  . COLONOSCOPY    . fistulaogram     x 2 last 2 years  . IR FLUORO GUIDE CV LINE RIGHT  09/16/2017  . IR US GUIDE VASC ACCESS RIGHT  09/16/2017  . IRRIGATION AND DEBRIDEMENT KNEE Left 09/15/2017   Procedure: IRRIGATION AND DEBRIDEMENT KNEE; arthroscopic clean out;  Surgeon: [name], MD;  Location: [facility];  Service: Orthopedics;  Laterality: Left;  . OTHER SURGICAL HISTORY     removal temporary HD catheter   . REVISON OF ARTERIOVENOUS FISTULA Left 10/10/2015   Procedure: REVISON OF LEFT RADIOCEPHALIC ARTERIOVENOUS FISTULA;  Surgeon: Angelia Mould, MD;  Location: [facility];  Service: Vascular;  Laterality: Left;  . REVISON OF ARTERIOVENOUS FISTULA Left 02/07/2016   Procedure: REPAIR OF PSEUDO-ANEUREYSM OF LEFT ARM  ARTERIOVENOUS FISTULA;  Surgeon: Angelia Mould, MD;  Location: [facility];  Service: Vascular;  Laterality: Left;  . REVISON OF ARTERIOVENOUS FISTULA Left 09/22/5850   Procedure: PLICATION OF LEFT ARM RADIOCEPHALIC ARTERIOVENOUS FISTULA PSEUDOANEURYSM;  Surgeon: Angelia Mould, MD;  Location: [facility];  Service: Vascular;  Laterality: Left;  . RIGHT/LEFT HEART CATH AND CORONARY ANGIOGRAPHY N/A 02/17/2017   Procedure: Right/Left Heart Cath and Coronary Angiography;  Surgeon: Sherren Mocha, MD;  Location: [facility];  Service: Cardiovascular;  Laterality: N/A;  . TEE WITHOUT CARDIOVERSION N/A 09/11/2016   Procedure: TRANSESOPHAGEAL ECHOCARDIOGRAM (TEE);  Surgeon: Dorothy Spark, MD;  Location: [facility];  Service: Cardiovascular;  Laterality: N/A;    Prior to Admission medications   Medication Sig Start Date End Date Taking? Authorizing Provider  albuterol (PROVENTIL) (2.5 MG/3ML) 0.083% nebulizer solution Take 2.5 mg by nebulization every 6 (six) hours  as needed for wheezing or shortness of breath.    [provider]  ALPRAZolam Duanne Moron) 0.25 MG tablet  11/29/17   [provider]  aspirin 81 MG chewable tablet Chew 1 tablet (81 mg total) by mouth daily. 10/15/17   Vaughan Basta, MD  bictegravir-emtricitabine-tenofovir AF (BIKTARVY) 50-200-25 MG TABS tablet Take 1 tablet by mouth daily. 12/27/17   Campbell Riches, MD  calcium acetate (PHOSLO) 667 MG capsule Take 667-2,001 mg by mouth See admin instructions. 1,334-2,001 mg three times a day with each meal and 667-1,334 mg with each snack 06/06/15   [provider]  calcium acetate (PHOSLO) 667 MG capsule calcium acetate 667 mg capsule  TK 3 CS PO TID WC    [provider]  carvedilol (COREG) 25 MG tablet Take 1 tablet (25 mg total) by mouth 2 (two) times daily with a meal. 10/05/17   Alphonzo Grieve, MD  cloNIDine (CATAPRES - DOSED IN MG/24 HR) 0.1 mg/24hr patch UNWRAP AND APPLY 1 PATCH TO SKIN EVERY 7 DAYS 11/30/17   Bartholomew Crews, MD  clotrimazole-betamethasone (LOTRISONE) cream Apply 1 application topically 2 (two) times daily. 09/08/17   Campbell Riches, MD  doxazosin (CARDURA) 4 MG tablet Take 4 mg by mouth at bedtime.  07/04/17   [provider]  epoetin alfa (EPOGEN,PROCRIT) 2000 UNIT/ML injection Inject 2,000 Units into the vein 3 (three) times a week.    [provider]  ethyl chloride spray Apply 1 application topically daily as needed (HD). At HD 05/20/15   [provider]  heparin 1000 UNIT/ML injection Inject 3,000 Units into the vein 4 (four) times a week.     [provider]  hydrALAZINE (APRESOLINE) 100 MG tablet Take 100 mg by mouth 3 (three) times daily.    [provider]  multivitamin (RENA-VIT) TABS tablet Take 1 tablet by mouth daily.      [provider]  polyethylene glycol-electrolytes (NULYTELY/GOLYTELY) 420 g solution TK 4000 ML PO ONCE FOR 1 DOSE 01/13/18   Jeffrey Bellows, MD  tenofovir Veva Holes) 300 MG tablet  01/05/18   [provider]  traZODone (DESYREL) 50 MG tablet Take 1 tablet (50 mg total) by mouth at bedtime as needed for sleep. 11/01/17   Campbell Riches, MD  VOLTAREN 1 % GEL APPLY 2 GRAMS EXTERNALLY TO THE AFFECTED AREA FOUR TIMES DAILY Patient not taking: Reported on 12/31/2017 09/08/17   Campbell Riches, MD    Allergies as of 01/13/2018 - Review Complete 12/31/2017  Allergen Reaction Noted  . Lisinopril Anaphylaxis and Shortness Of Breath 08/31/2012  . Penicillins Other (See Comments)     Family History  Problem  Relation Age of Onset  . Hypertension Mother   . Diabetes Mother   . Cancer Father     Social History   Socioeconomic History  . Marital status: Single    Spouse name: Not on file  . Number of children: Not on file  . Years of education: Not on file  . Highest education level: Not on file  Occupational History  . Not on file  Social Needs  . Financial resource strain: Not on file  . Food insecurity:    Worry: Not on file    Inability: Not on file  . Transportation needs:    Medical: Not on file    Non-medical: Not on file  Tobacco Use  . Smoking status: Former Smoker    Years: 10.00    Types: Cigarettes  Last attempt to quit: 08/08/2015    Years since quitting: 2.4  . Smokeless tobacco: Never Used  . Tobacco comment: casual smoking for 10 years  Substance and Sexual Activity  . Alcohol use: No    Alcohol/week: 0.0 oz    Comment: quit 2004  . Drug use: No    Comment: uds (+) cocaine in 08/2011 but pt states was taking sudafed at the time  . Sexual activity: Not Currently    Partners: Male  Lifestyle  . Physical activity:    Days per week: Not on file    Minutes per session: Not on file  . Stress: Not on file  Relationships  . Social connections:    Talks on phone: Not on file    Gets together: Not on file    Attends religious service: Not on file    Active member of club or organization: Not on file    Attends meetings of clubs or organizations: Not on file    Relationship status: Not on file  . Intimate partner violence:    Fear of current or ex partner: Not on file    Emotionally abused: Not on file    Physically abused: Not on file    Forced sexual activity: Not on file  Other Topics Concern  . 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 General:   Alert,  pleasant and cooperative in NAD Head:  Normocephalic and atraumatic. Neck:  Supple; no masses or thyromegaly. Lungs:  Clear throughout to auscultation, normal respiratory effort.    Heart:  +S1, +S2, Regular rate and rhythm, No edema. Abdomen:  Soft, nontender and nondistended. Normal bowel sounds, without guarding, and without rebound.   Neurologic:  Alert and  oriented x4;  grossly normal neurologically.  Impression/Plan: Jeffrey [name] is here for an endoscopy and colonoscopy  to be performed for  evaluation of iron deficiency anemia    Risks, benefits, limitations, and alternatives regarding endoscopy have been reviewed with the patient.  Questions have been answered.  All parties agreeable.   Jeffrey Bellows, MD  01/27/2018, 8:03 AM

## 2018-01-27 NOTE — Anesthesia Post-op Follow-up Note (Signed)
Anesthesia QCDR form completed.        

## 2018-01-27 NOTE — Transfer of Care (Signed)
Immediate Anesthesia Transfer of Care Note  Patient: Jeffrey [name]  Procedure(s) Performed: Procedure(s): COLONOSCOPY WITH PROPOFOL (N/A) ESOPHAGOGASTRODUODENOSCOPY (EGD) WITH PROPOFOL (N/A)  Patient Location: PACU and Endoscopy Unit  Anesthesia Type:General  Level of Consciousness: sedated  Airway & Oxygen Therapy: Patient Spontanous Breathing and Patient connected to nasal cannula oxygen  Post-op Assessment: Report given to RN and Post -op Vital signs reviewed and stable  Post vital signs: Reviewed and stable  Last Vitals:  Vitals:   01/27/18 0810 01/27/18 0951  BP: (!) 189/88 (!) 119/55  Pulse: 73 75  Resp: 18 (!) 24  Temp: (!) 35.6 C (!) 36 C  SpO2:  79%    Complications: No apparent anesthesia complications

## 2018-01-28 ENCOUNTER — Other Ambulatory Visit: Payer: Self-pay | Admitting: Internal Medicine

## 2018-01-28 DIAGNOSIS — I10 Essential (primary) hypertension: Secondary | ICD-10-CM

## 2018-01-28 LAB — SURGICAL PATHOLOGY

## 2018-01-28 IMAGING — MR MR LUMBAR SPINE W/O CM
4 of 6 series · 19 of 48 positions shown · non-contrast
Comparison: None.

CLINICAL DATA: End-stage renal disease use, HIV. Severe low back
pain.

EXAM:
MRI LUMBAR SPINE WITHOUT CONTRAST
TECHNIQUE: Multiplanar, multisequence MR imaging of the lumbar spine was
performed. No intravenous contrast was administered.

[Series 2: T2 · sagittal · 4.0mm · 0.55mm/px · 5 of 15 slices shown (1 of 2)]
[im 1/15]
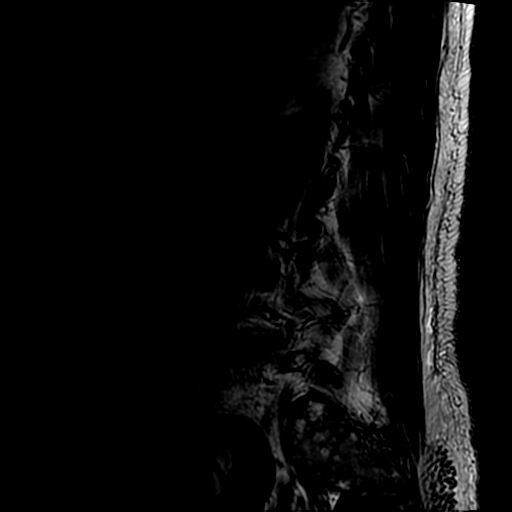
[im 4/15]
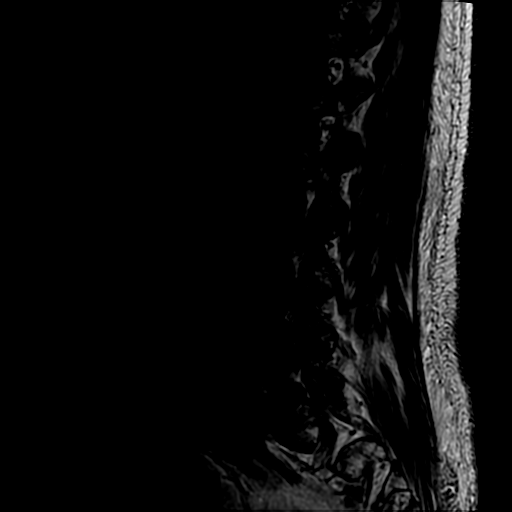
[im 8/15]
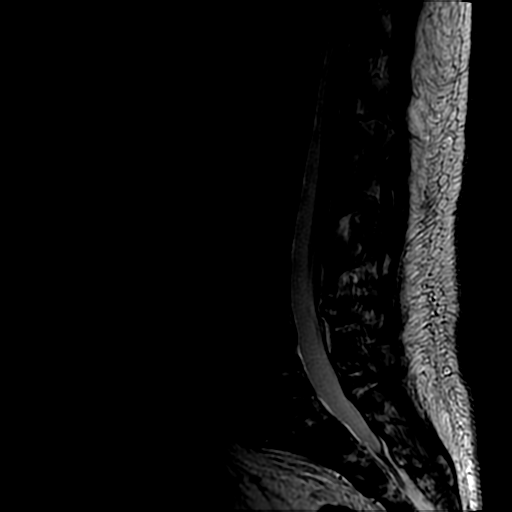
[im 11/15]
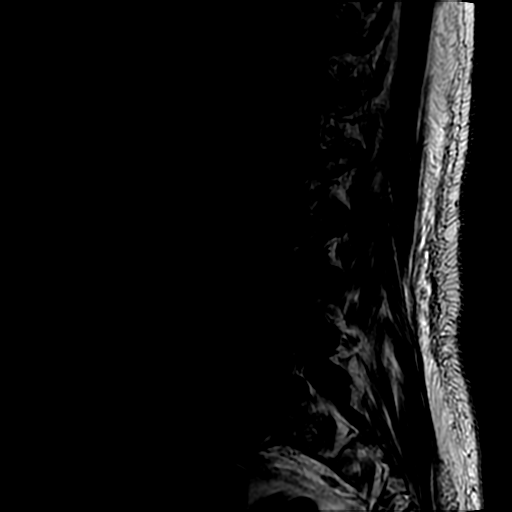
[im 15/15]
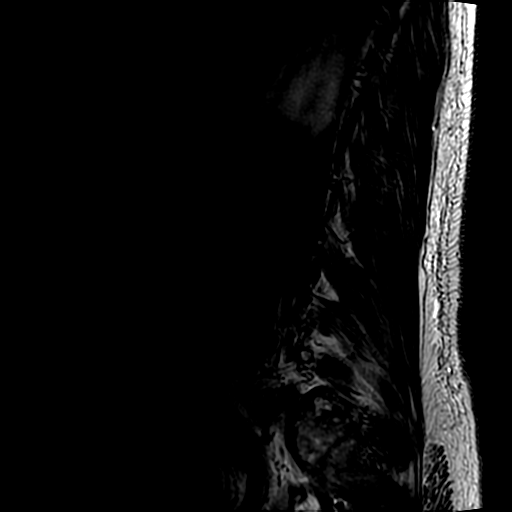

[Series 4: T1 · sagittal · 4.0mm · 0.55mm/px · 3 of 15 slices shown (1 of 2)]
[im 1/15]
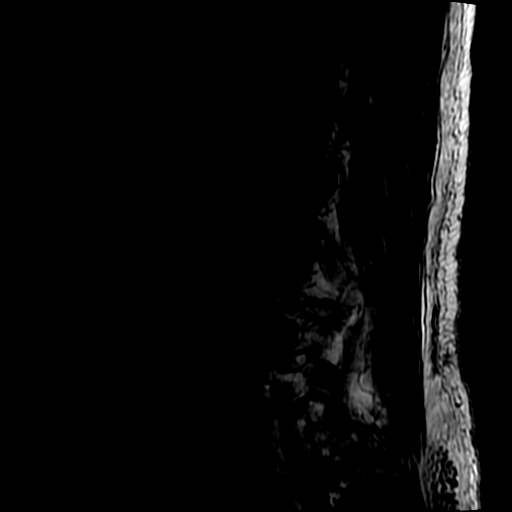
[im 8/15]
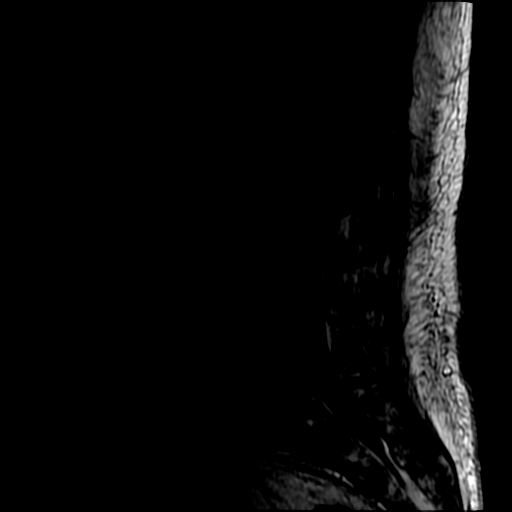
[im 15/15]
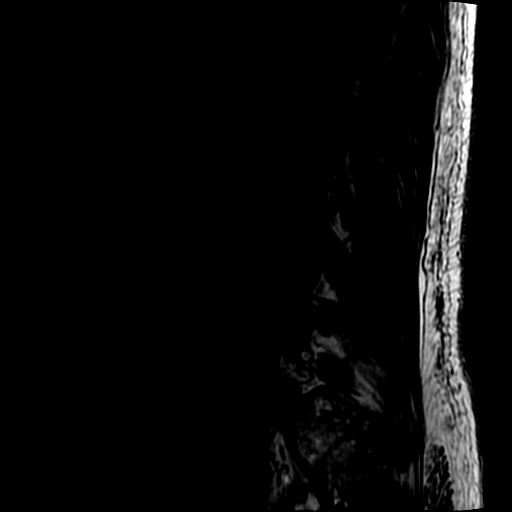

[Series 5: T2 · axial · 4.0mm · 0.39mm/px · z∈[-109,+67]mm · 8 of 37 slices shown (2 of 2)]
[im 1/37]
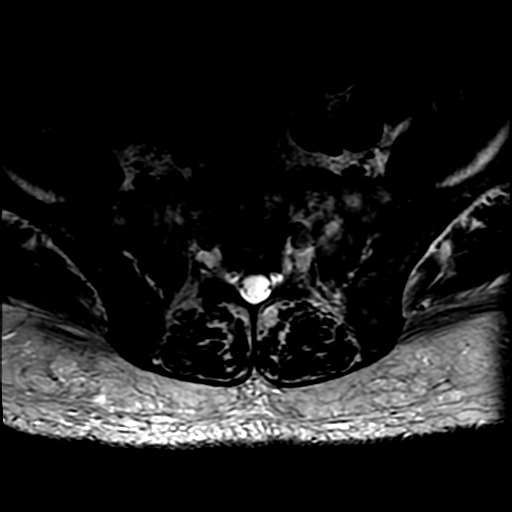
[im 4/37]
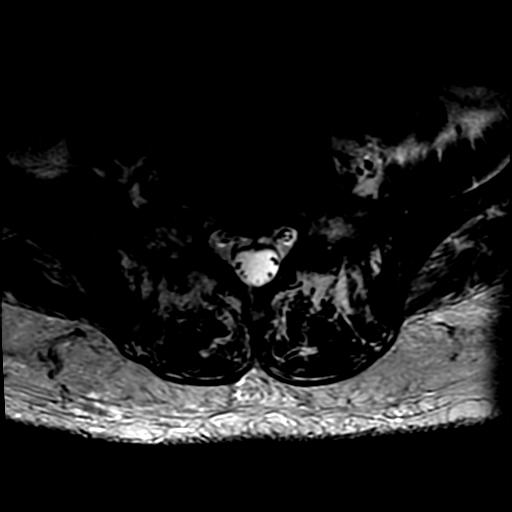
[im 8/37]
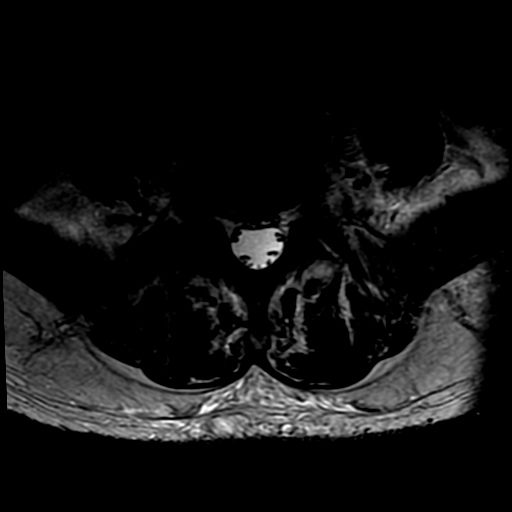
[im 11/37]
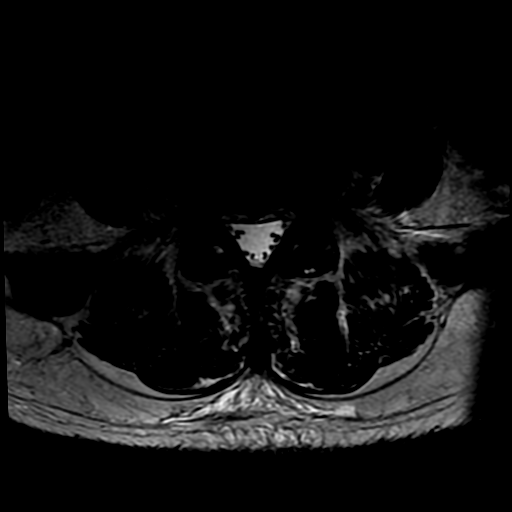
[im 15/37]
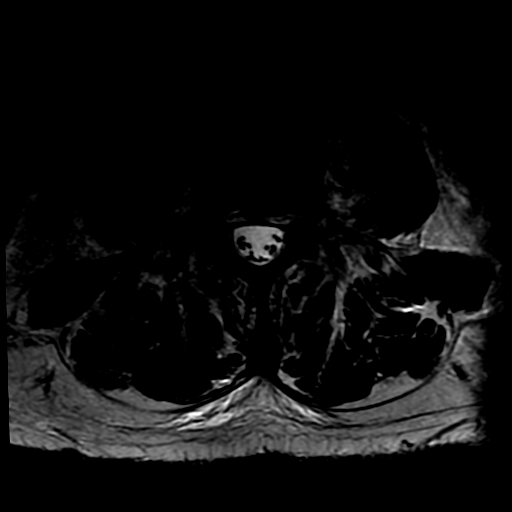
[im 19/37]
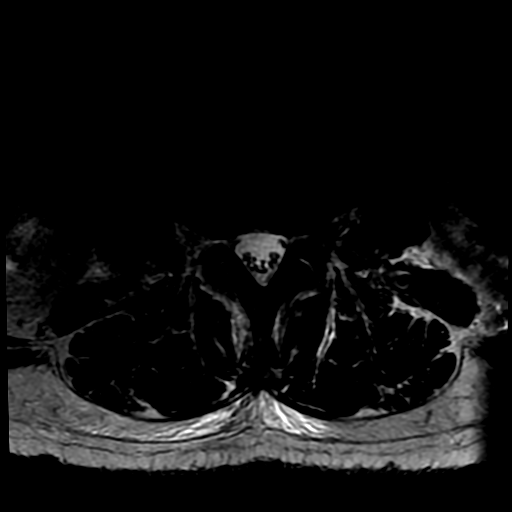
[im 22/37]
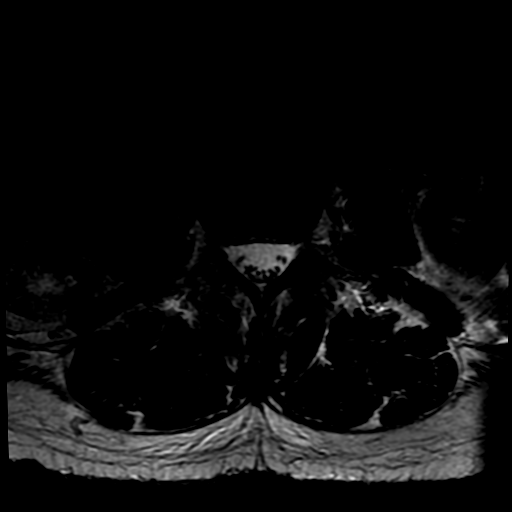
[im 33/37]
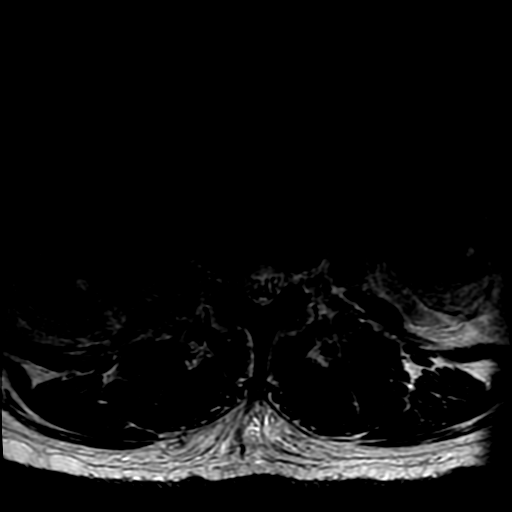

[Series 6: T1 · axial · 4.0mm · 0.39mm/px · z∈[-94,+67]mm · 3 of 37 slices shown (2 of 2)]
[im 4/37]
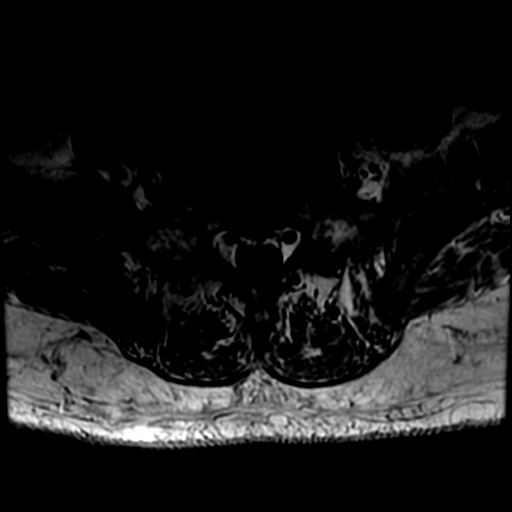
[im 19/37]
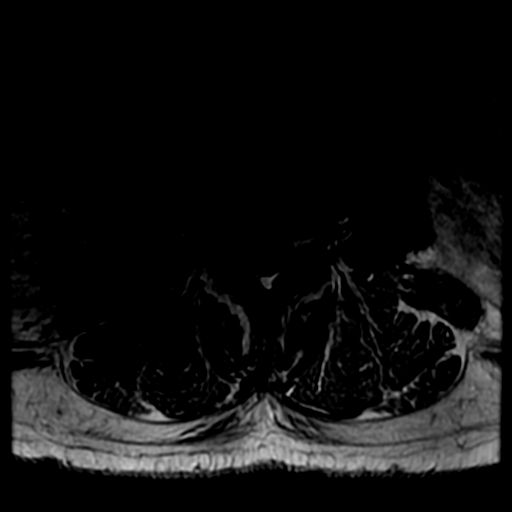
[im 33/37]
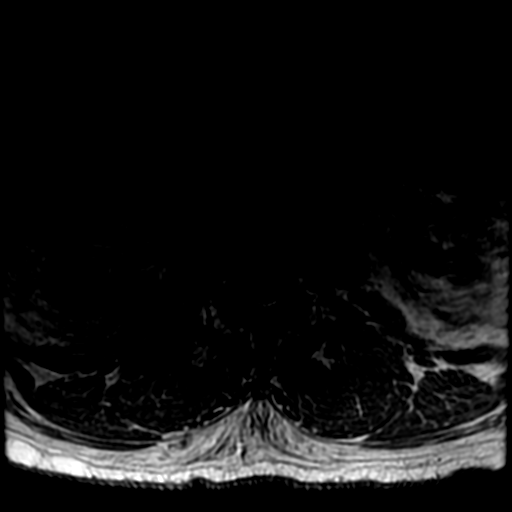

[19 of 48 positions shown; findings below may reference images not displayed]

FINDINGS: Segmentation: Normal. The lowest disc space is considered to be
L5-S1.

Alignment:  Grade 1 anterolisthesis at L5-S1

Vertebrae: There is diffusely heterogeneous marrow signal throughout
the visualized vertebral column. There is no evidence of discitis
osteomyelitis. No epidural collection.

Conus medullaris: Extends to the L1 level and appears normal.

Paraspinal and other soft tissues: The kidneys are atrophic with
multiple renal cyst. The other visualized retroperitoneal structures
are unremarkable.

Disc levels:

T12-L1: Evaluated on sagittal images only. No significant disc
herniation, spinal canal stenosis or neuroforaminal narrowing.

L1-L2: Normal disc space and facet joints. No spinal canal stenosis.
No neuroforaminal stenosis.

L2-L3: Normal disc space and facet joints. No spinal canal stenosis.
No neuroforaminal stenosis.

L3-L4: Normal disc space and facet joints. No spinal canal stenosis.
No neuroforaminal stenosis.

L4-L5: Narrowing of the intervertebral disc space with minimal
bulge. Moderate bilateral facet hypertrophy. No spinal canal
stenosis. Moderate right and severe left neuroforaminal stenosis.

L5-S1: Disc bulge with superimposed central disc protrusion. No
spinal canal stenosis. Severe right and moderate left neuroforaminal
stenosis.
IMPRESSION: 1. Moderately severe bilateral neural foraminal stenosis at L4-L5
and L5-S1.
2. Diffusely heterogeneous marrow signal is likely secondary to
chronic anemia and/or dialysis. Marrow replacement disorders may
cause a similar appearance.

## 2018-01-28 NOTE — Telephone Encounter (Signed)
Approved 1 month of clonidine patches. Agree patient needs appointment. No showed last appointment with me.

## 2018-01-28 NOTE — Telephone Encounter (Signed)
Called pt -his brother answered the telephone; stated he will schedule the appt for pt and make sure he comes.  I transferred call to front office; he told Chilon, he will have pt to call back to schedule his appt.

## 2018-01-29 ENCOUNTER — Other Ambulatory Visit: Payer: Self-pay | Admitting: Infectious Diseases

## 2018-01-29 DIAGNOSIS — F419 Anxiety disorder, unspecified: Secondary | ICD-10-CM

## 2018-01-31 ENCOUNTER — Telehealth: Payer: Self-pay

## 2018-01-31 ENCOUNTER — Other Ambulatory Visit: Payer: Self-pay

## 2018-01-31 DIAGNOSIS — A048 Other specified bacterial intestinal infections: Secondary | ICD-10-CM

## 2018-01-31 DIAGNOSIS — N186 End stage renal disease: Secondary | ICD-10-CM | POA: Diagnosis not present

## 2018-01-31 DIAGNOSIS — D509 Iron deficiency anemia, unspecified: Secondary | ICD-10-CM | POA: Diagnosis not present

## 2018-01-31 DIAGNOSIS — N2581 Secondary hyperparathyroidism of renal origin: Secondary | ICD-10-CM | POA: Diagnosis not present

## 2018-01-31 DIAGNOSIS — Z992 Dependence on renal dialysis: Secondary | ICD-10-CM | POA: Diagnosis not present

## 2018-01-31 DIAGNOSIS — K3189 Other diseases of stomach and duodenum: Secondary | ICD-10-CM

## 2018-01-31 DIAGNOSIS — D631 Anemia in chronic kidney disease: Secondary | ICD-10-CM | POA: Diagnosis not present

## 2018-01-31 MED ORDER — OMEPRAZOLE 20 MG PO CPDR: 20.0000 mg | DELAYED_RELEASE_CAPSULE | Freq: Every day | ORAL | 3 refills | Status: DC

## 2018-01-31 MED ORDER — TETRACYCLINE HCL 500 MG PO CAPS: 500.0000 mg | ORAL_CAPSULE | Freq: Four times a day (QID) | ORAL | 0 refills | Status: DC

## 2018-01-31 MED ORDER — BIS SUBCIT-METRONID-TETRACYC 140-125-125 MG PO CAPS: 3.0000 | ORAL_CAPSULE | Freq: Three times a day (TID) | ORAL | 0 refills | Status: DC

## 2018-01-31 MED ORDER — BISMUTH 262 MG PO CHEW: 262.0000 mg | CHEWABLE_TABLET | Freq: Four times a day (QID) | ORAL | 0 refills | Status: DC

## 2018-01-31 MED ORDER — METRONIDAZOLE 250 MG PO TABS: 250.0000 mg | ORAL_TABLET | Freq: Four times a day (QID) | ORAL | 0 refills | Status: DC

## 2018-01-31 NOTE — Telephone Encounter (Signed)
Advised of results per Dr. Vicente Males:  Inform has H pylori gastritis and since allergic to PCN   Suggest Pylera for 14 days.  - Sent order to pharmacy.

## 2018-01-31 NOTE — Telephone Encounter (Signed)
Leontine Locket, CMA  Cc: Timothy Lasso, RN        Good Morning Shreyansh Tiffany,   The contact per for scheduling Mr. Cona is his brother, [name] @ [phone].   Thank you.  Panya C.

## 2018-01-31 NOTE — Telephone Encounter (Signed)
Advised of results per Dr. Vicente Males.    - 1. 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 Next step is the EUS- please ensure it has been coordinated.   2. Multiple polyps of the colon all pre cancerous- repeat colonoscopy in 6 months   3. Have him check his HB in 2-3 weeks as this duodenal lesion is likely source of his anemia  - CT of Abd/Pelvis with contrast has been ordered STAT. Phone # to central scheduling given.  - Sent a message to Dr. Ardis Hughs nurse Patty to contact Mr. Foreman for scheduling.

## 2018-01-31 NOTE — Telephone Encounter (Signed)
-----   Message from Milus Banister, MD sent at 01/31/2018  9:02 AM EDT ----- OK, thanks  The mass was a decent size on EGD views, and since there is HGD visible I am suspicious that it may actually harbor cancer.  I think EUS is a good next step and we will arrange for it. In the meantime, could you please have him get a CT scan IV and oral contrast, abdomen and pelvis to evaluate the mass as well?  Johney Perotti, See above. They will arrange CT scan abd/pelvis. Can you please contact him about upper EUS, radial +/- linear, next available MAC Thursday for duodenal mass. Thanks  DJ   ----- Message ----- From: Leontine Locket, CMA Sent: 01/31/2018   8:25 AM To: Milus Banister, MD  Pathology Results

## 2018-02-01 ENCOUNTER — Other Ambulatory Visit: Payer: Self-pay

## 2018-02-01 ENCOUNTER — Telehealth: Payer: Self-pay | Admitting: Gastroenterology

## 2018-02-01 DIAGNOSIS — D509 Iron deficiency anemia, unspecified: Secondary | ICD-10-CM | POA: Diagnosis not present

## 2018-02-01 DIAGNOSIS — D631 Anemia in chronic kidney disease: Secondary | ICD-10-CM | POA: Diagnosis not present

## 2018-02-01 DIAGNOSIS — K3189 Other diseases of stomach and duodenum: Secondary | ICD-10-CM

## 2018-02-01 DIAGNOSIS — N2581 Secondary hyperparathyroidism of renal origin: Secondary | ICD-10-CM | POA: Diagnosis not present

## 2018-02-01 DIAGNOSIS — N186 End stage renal disease: Secondary | ICD-10-CM | POA: Diagnosis not present

## 2018-02-01 DIAGNOSIS — Z992 Dependence on renal dialysis: Secondary | ICD-10-CM | POA: Diagnosis not present

## 2018-02-01 NOTE — Telephone Encounter (Signed)
The pt's brother has been notified via My Chart.

## 2018-02-01 NOTE — Telephone Encounter (Signed)
See alternate note  

## 2018-02-01 NOTE — Telephone Encounter (Signed)
Please call patient with results. You talked to his brother yesterday. Please call

## 2018-02-01 NOTE — Telephone Encounter (Signed)
The pt has been scheduled for EUS on 02/17/18 at 830 am [facility].  Confirm if the pt is on heparin.  Left message on machine to call back

## 2018-02-01 NOTE — Telephone Encounter (Signed)
Pt's brother [name] returned your call and would like a call back.

## 2018-02-01 NOTE — Telephone Encounter (Signed)
No need to stop heparin with HD.

## 2018-02-01 NOTE — Telephone Encounter (Signed)
EUS scheduled, pt instructed and medications reviewed.  Patient instructions mailed to home.  Patient to call with any questions or concerns.  Dr Ardis Hughs the pt's brother states that the pt has heparin on dialysis days.  Will this need to be held?

## 2018-02-02 ENCOUNTER — Ambulatory Visit (HOSPITAL_COMMUNITY)
Admission: RE | Admit: 2018-02-02 | Discharge: 2018-02-02 | Disposition: A | Discharge status: Home / Self Care | Payer: Medicare Other | Source: Ambulatory Visit | Attending: Gastroenterology | Admitting: Gastroenterology

## 2018-02-02 ENCOUNTER — Telehealth: Payer: Self-pay

## 2018-02-02 ENCOUNTER — Encounter: Payer: Self-pay | Admitting: Gastroenterology

## 2018-02-02 DIAGNOSIS — R1310 Dysphagia, unspecified: Secondary | ICD-10-CM | POA: Diagnosis not present

## 2018-02-02 DIAGNOSIS — I700 Atherosclerosis of aorta: Secondary | ICD-10-CM | POA: Diagnosis not present

## 2018-02-02 DIAGNOSIS — R911 Solitary pulmonary nodule: Secondary | ICD-10-CM | POA: Diagnosis not present

## 2018-02-02 DIAGNOSIS — K3189 Other diseases of stomach and duodenum: Secondary | ICD-10-CM

## 2018-02-02 DIAGNOSIS — N2581 Secondary hyperparathyroidism of renal origin: Secondary | ICD-10-CM | POA: Diagnosis not present

## 2018-02-02 DIAGNOSIS — J90 Pleural effusion, not elsewhere classified: Secondary | ICD-10-CM | POA: Diagnosis not present

## 2018-02-02 DIAGNOSIS — D631 Anemia in chronic kidney disease: Secondary | ICD-10-CM | POA: Diagnosis not present

## 2018-02-02 DIAGNOSIS — N186 End stage renal disease: Secondary | ICD-10-CM | POA: Diagnosis not present

## 2018-02-02 DIAGNOSIS — Z992 Dependence on renal dialysis: Secondary | ICD-10-CM | POA: Diagnosis not present

## 2018-02-02 DIAGNOSIS — D509 Iron deficiency anemia, unspecified: Secondary | ICD-10-CM | POA: Diagnosis not present

## 2018-02-02 DIAGNOSIS — N261 Atrophy of kidney (terminal): Secondary | ICD-10-CM | POA: Diagnosis not present

## 2018-02-02 MED ORDER — IOPAMIDOL (ISOVUE-300) INJECTION 61%
INTRAVENOUS | Status: AC
  Filled 2018-02-02: qty 100

## 2018-02-02 MED ORDER — IOPAMIDOL (ISOVUE-300) INJECTION 61%
100.0000 mL | Freq: Once | INTRAVENOUS | Status: AC | PRN
  Administered 2018-02-02: 100 mL via INTRAVENOUS

## 2018-02-02 NOTE — Telephone Encounter (Signed)
Returned patients phone call as requested   - Please call patient with results. You talked to his brother yesterday. Please call

## 2018-02-03 ENCOUNTER — Other Ambulatory Visit: Payer: Self-pay

## 2018-02-03 ENCOUNTER — Ambulatory Visit: Payer: Medicare Other

## 2018-02-03 DIAGNOSIS — D509 Iron deficiency anemia, unspecified: Secondary | ICD-10-CM | POA: Diagnosis not present

## 2018-02-03 DIAGNOSIS — N186 End stage renal disease: Secondary | ICD-10-CM | POA: Diagnosis not present

## 2018-02-03 DIAGNOSIS — N2581 Secondary hyperparathyroidism of renal origin: Secondary | ICD-10-CM | POA: Diagnosis not present

## 2018-02-03 DIAGNOSIS — Z992 Dependence on renal dialysis: Secondary | ICD-10-CM | POA: Diagnosis not present

## 2018-02-03 DIAGNOSIS — D631 Anemia in chronic kidney disease: Secondary | ICD-10-CM | POA: Diagnosis not present

## 2018-02-03 DIAGNOSIS — R935 Abnormal findings on diagnostic imaging of other abdominal regions, including retroperitoneum: Secondary | ICD-10-CM

## 2018-02-03 NOTE — Progress Notes (Signed)
pulm

## 2018-02-04 ENCOUNTER — Telehealth: Payer: Self-pay

## 2018-02-04 ENCOUNTER — Telehealth: Payer: Self-pay | Admitting: Gastroenterology

## 2018-02-04 NOTE — Telephone Encounter (Signed)
Patients brother, [name], called and doesn't see his procedure on my chart. Please call him and let him know. [name] [phone]

## 2018-02-04 NOTE — Telephone Encounter (Signed)
LVM for patient callback to advise of CT results and Pulmonary consult for pleural effusion.

## 2018-02-07 ENCOUNTER — Encounter (HOSPITAL_COMMUNITY): Payer: Self-pay

## 2018-02-07 ENCOUNTER — Telehealth: Payer: Self-pay | Admitting: Behavioral Health

## 2018-02-07 ENCOUNTER — Other Ambulatory Visit: Payer: Self-pay

## 2018-02-07 DIAGNOSIS — N2581 Secondary hyperparathyroidism of renal origin: Secondary | ICD-10-CM | POA: Diagnosis not present

## 2018-02-07 DIAGNOSIS — Z992 Dependence on renal dialysis: Secondary | ICD-10-CM | POA: Diagnosis not present

## 2018-02-07 DIAGNOSIS — N186 End stage renal disease: Secondary | ICD-10-CM | POA: Diagnosis not present

## 2018-02-07 DIAGNOSIS — D509 Iron deficiency anemia, unspecified: Secondary | ICD-10-CM | POA: Diagnosis not present

## 2018-02-07 DIAGNOSIS — D631 Anemia in chronic kidney disease: Secondary | ICD-10-CM | POA: Diagnosis not present

## 2018-02-07 NOTE — Telephone Encounter (Signed)
Patient called in requesting to speak to Dr. Johnnye Sima.  Writer was able to help patient.  Patient states he started Biktarvy a month ago and wanted to know, Hoe would he know if the medication is working.  Writer explained to patient that he has an appointment with Pharmacy Wednesday 02/09/2018 at 10:30am.  Patient was receptive and verbalized understanding. atherosclerotic heart disease. Pricilla Riffle RN

## 2018-02-08 DIAGNOSIS — D631 Anemia in chronic kidney disease: Secondary | ICD-10-CM | POA: Diagnosis not present

## 2018-02-08 DIAGNOSIS — N2581 Secondary hyperparathyroidism of renal origin: Secondary | ICD-10-CM | POA: Diagnosis not present

## 2018-02-08 DIAGNOSIS — D509 Iron deficiency anemia, unspecified: Secondary | ICD-10-CM | POA: Diagnosis not present

## 2018-02-08 DIAGNOSIS — N186 End stage renal disease: Secondary | ICD-10-CM | POA: Diagnosis not present

## 2018-02-08 DIAGNOSIS — Z992 Dependence on renal dialysis: Secondary | ICD-10-CM | POA: Diagnosis not present

## 2018-02-09 ENCOUNTER — Ambulatory Visit (INDEPENDENT_AMBULATORY_CARE_PROVIDER_SITE_OTHER): Payer: Medicare Other | Admitting: Pharmacist Clinician (PhC)/ Clinical Pharmacy Specialist

## 2018-02-09 DIAGNOSIS — B20 Human immunodeficiency virus [HIV] disease: Secondary | ICD-10-CM | POA: Diagnosis not present

## 2018-02-09 NOTE — Progress Notes (Signed)
HPI: Jeffrey [name] is a 62 y.o. male who is here to see pharmacy for his HIV follow up.   Allergies: Allergies  Allergen Reactions  . Lisinopril Anaphylaxis and Shortness Of Breath    Throat swelling  . Penicillins Other (See Comments)    Childhood allergy Has patient had a PCN reaction causing immediate rash, facial/tongue/throat swelling, SOB or lightheadedness with hypotension: Unk Has patient had a PCN reaction causing severe rash involving mucus membranes or skin necrosis: Unk Has patient had a PCN reaction that required hospitalization: Unk Has patient had a PCN reaction occurring within the last 10 years: No If all of the above answers are "NO", then may proceed with Cephalosporin use.     Vitals:    Past Medical History: Past Medical History:  Diagnosis Date  . Anemia   . Anxiety   . Arthritis   . Asthma    per pt hx  . End stage renal disease on home HD 07/10/2011   Started HD in September 2012 at [facility] with a tunneled HD catheter, now on home HD with NxtStage. Dialyzing through AVF L lower arm with buttonhole technique as of mid 2014. His brother does the HD treatments at home.  They are roommates for 23 years.  The brother works 3rd shift and gets off about 8am and then puts Jeffrey [name] on HD in the morning after getting home. Most of the time he does HD about 4 times a week, for about 4 hours per treatment. Cause of ESRD was HTN according to patient. He says he let his health go and ending up with complications, and that he didn't like seeing doctors in those days.  He says he was diagnosed with severe HTN when he lived in [place] in his 21's.   . Hepatitis B carrier ([place])   . HIV infection ([place])   . Hypertension   . Hyperthyroidism    normal now  . Pneumonia several yrs ago  . Seizure ([place])   . Seizures ([place]) 06/02/2011   x 1 none since  . Thrombocytopenia ([place])     Social History: Social History   Socioeconomic History  . Marital status: Single    Spouse  name: Not on file  . Number of children: Not on file  . Years of education: Not on file  . Highest education level: Not on file  Occupational History  . Not on file  Social Needs  . Financial resource strain: Not on file  . Food insecurity:    Worry: Not on file    Inability: Not on file  . Transportation needs:    Medical: Not on file    Non-medical: Not on file  Tobacco Use  . Smoking status: Former Smoker    Years: 10.00    Types: Cigarettes    Last attempt to quit: 08/08/2015    Years since quitting: 2.5  . Smokeless tobacco: Never Used  . Tobacco comment: casual smoking for 10 years  Substance and Sexual Activity  . Alcohol use: No    Alcohol/week: 0.0 oz    Comment: quit 2004  . Drug use: No    Comment: uds (+) cocaine in 08/2011 but pt states was taking sudafed at the time  . Sexual activity: Not Currently    Partners: Male  Lifestyle  . Physical activity:    Days per week: Not on file    Minutes per session: Not on file  . Stress: Not on file  Relationships  . Social connections:    Talks on phone: Not on file    Gets together: Not on file    Attends religious service: Not on file    Active member of club or organization: Not on file    Attends meetings of clubs or organizations: Not on file    Relationship status: Not on file  Other Topics Concern  . 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    From [employer]    Previous Regimen: Lamivudine/Isentress/Viread  Current Regimen: [place]: HIV 1 RNA Quant (copies/mL)  Date Value  11/01/2017 <20 DETECTED (A)  02/10/2017 <20 NOT DETECTED  09/09/2016 <20   CD4 T Cell Abs (/uL)  Date Value  11/01/2017 390 (L)  02/10/2017 460  09/09/2016 250 (L)   Hep B S Ab (no units)  Date Value  06/02/2011 NEGATIVE   Hepatitis B Surface Ag (no units)  Date  Value  09/24/2017 Positive (A)   HCV Ab (no units)  Date Value  06/03/2011 NEGATIVE    CrCl: CrCl cannot be calculated (Patient's most recent lab result is older than the maximum 21 days allowed.).  Lipids:    Component Value Date/Time   CHOL 120 11/01/2017 1008   TRIG 106 11/01/2017 1008   HDL 29 (L) 11/01/2017 1008   CHOLHDL 4.1 11/01/2017 1008   VLDL 5 02/10/2017 1619   LDLCALC 72 11/01/2017 1008    Assessment: Jeffrey [name] is an ESRD patient who is here today to see pharmacy for his HIV follow up. When he was on the previous regimen, lamivudine became on national backordered. Discussed with Dr. Johnnye Sima about consolidating his regimen to [facility] since bictegravir and TAF is not renally cleared. They didn't see much side effects with emtricitabine when they study Genvoya in ESRD patients. He has tolerated Biktarvy very well and he is a super compliant patient. We will get labs today.   Recommendations:  HIV VL today Continue Biktarvy 1 PO qday F/u with labs then Dr. Johnnye Sima in August  Onnie Boer, PharmD, BCPS, [facility] 02/09/2018, 11:41 AM

## 2018-02-10 DIAGNOSIS — N2581 Secondary hyperparathyroidism of renal origin: Secondary | ICD-10-CM | POA: Diagnosis not present

## 2018-02-10 DIAGNOSIS — N186 End stage renal disease: Secondary | ICD-10-CM | POA: Diagnosis not present

## 2018-02-10 DIAGNOSIS — D509 Iron deficiency anemia, unspecified: Secondary | ICD-10-CM | POA: Diagnosis not present

## 2018-02-10 DIAGNOSIS — Z992 Dependence on renal dialysis: Secondary | ICD-10-CM | POA: Diagnosis not present

## 2018-02-10 DIAGNOSIS — D631 Anemia in chronic kidney disease: Secondary | ICD-10-CM | POA: Diagnosis not present

## 2018-02-11 LAB — HIV-1 RNA QUANT-NO REFLEX-BLD
HIV 1 RNA Quant: <20 copies/mL
HIV-1 RNA Quant, Log: <1.3 Log copies/mL

## 2018-02-14 DIAGNOSIS — D509 Iron deficiency anemia, unspecified: Secondary | ICD-10-CM | POA: Diagnosis not present

## 2018-02-14 DIAGNOSIS — Z992 Dependence on renal dialysis: Secondary | ICD-10-CM | POA: Diagnosis not present

## 2018-02-14 DIAGNOSIS — D631 Anemia in chronic kidney disease: Secondary | ICD-10-CM | POA: Diagnosis not present

## 2018-02-14 DIAGNOSIS — N2581 Secondary hyperparathyroidism of renal origin: Secondary | ICD-10-CM | POA: Diagnosis not present

## 2018-02-14 DIAGNOSIS — N186 End stage renal disease: Secondary | ICD-10-CM | POA: Diagnosis not present

## 2018-02-15 DIAGNOSIS — N186 End stage renal disease: Secondary | ICD-10-CM | POA: Diagnosis not present

## 2018-02-15 DIAGNOSIS — N2581 Secondary hyperparathyroidism of renal origin: Secondary | ICD-10-CM | POA: Diagnosis not present

## 2018-02-15 DIAGNOSIS — Z992 Dependence on renal dialysis: Secondary | ICD-10-CM | POA: Diagnosis not present

## 2018-02-15 DIAGNOSIS — D631 Anemia in chronic kidney disease: Secondary | ICD-10-CM | POA: Diagnosis not present

## 2018-02-15 DIAGNOSIS — D509 Iron deficiency anemia, unspecified: Secondary | ICD-10-CM | POA: Diagnosis not present

## 2018-02-16 ENCOUNTER — Ambulatory Visit: Payer: Medicare Other | Admitting: Vascular Surgery

## 2018-02-17 ENCOUNTER — Encounter (HOSPITAL_COMMUNITY): Payer: Self-pay | Admitting: Gastroenterology

## 2018-02-17 ENCOUNTER — Encounter (HOSPITAL_COMMUNITY)
Admission: RE | Disposition: A | Discharge status: Home / Self Care | Payer: Self-pay | Source: Ambulatory Visit | Attending: Gastroenterology

## 2018-02-17 ENCOUNTER — Ambulatory Visit (HOSPITAL_COMMUNITY): Payer: Medicare Other | Admitting: Anesthesiology

## 2018-02-17 ENCOUNTER — Ambulatory Visit (HOSPITAL_COMMUNITY)
Admission: RE | Admit: 2018-02-17 | Discharge: 2018-02-17 | Disposition: A | Discharge status: Home / Self Care | Payer: Medicare Other | Source: Ambulatory Visit | Attending: Gastroenterology | Admitting: Gastroenterology

## 2018-02-17 ENCOUNTER — Other Ambulatory Visit: Payer: Self-pay

## 2018-02-17 DIAGNOSIS — K317 Polyp of stomach and duodenum: Secondary | ICD-10-CM | POA: Diagnosis not present

## 2018-02-17 DIAGNOSIS — Z21 Asymptomatic human immunodeficiency virus [HIV] infection status: Secondary | ICD-10-CM | POA: Insufficient documentation

## 2018-02-17 DIAGNOSIS — J45909 Unspecified asthma, uncomplicated: Secondary | ICD-10-CM | POA: Insufficient documentation

## 2018-02-17 DIAGNOSIS — D509 Iron deficiency anemia, unspecified: Secondary | ICD-10-CM | POA: Insufficient documentation

## 2018-02-17 DIAGNOSIS — M1990 Unspecified osteoarthritis, unspecified site: Secondary | ICD-10-CM | POA: Diagnosis not present

## 2018-02-17 DIAGNOSIS — Z7982 Long term (current) use of aspirin: Secondary | ICD-10-CM | POA: Insufficient documentation

## 2018-02-17 DIAGNOSIS — B20 Human immunodeficiency virus [HIV] disease: Secondary | ICD-10-CM | POA: Diagnosis not present

## 2018-02-17 DIAGNOSIS — K3189 Other diseases of stomach and duodenum: Secondary | ICD-10-CM

## 2018-02-17 DIAGNOSIS — N2581 Secondary hyperparathyroidism of renal origin: Secondary | ICD-10-CM | POA: Diagnosis not present

## 2018-02-17 DIAGNOSIS — N186 End stage renal disease: Secondary | ICD-10-CM | POA: Insufficient documentation

## 2018-02-17 DIAGNOSIS — B181 Chronic viral hepatitis B without delta-agent: Secondary | ICD-10-CM | POA: Diagnosis not present

## 2018-02-17 DIAGNOSIS — Z87891 Personal history of nicotine dependence: Secondary | ICD-10-CM | POA: Insufficient documentation

## 2018-02-17 DIAGNOSIS — F419 Anxiety disorder, unspecified: Secondary | ICD-10-CM | POA: Insufficient documentation

## 2018-02-17 DIAGNOSIS — Z888 Allergy status to other drugs, medicaments and biological substances status: Secondary | ICD-10-CM | POA: Diagnosis not present

## 2018-02-17 DIAGNOSIS — I120 Hypertensive chronic kidney disease with stage 5 chronic kidney disease or end stage renal disease: Secondary | ICD-10-CM | POA: Diagnosis not present

## 2018-02-17 DIAGNOSIS — D132 Benign neoplasm of duodenum: Secondary | ICD-10-CM | POA: Diagnosis not present

## 2018-02-17 DIAGNOSIS — Z992 Dependence on renal dialysis: Secondary | ICD-10-CM | POA: Insufficient documentation

## 2018-02-17 DIAGNOSIS — D631 Anemia in chronic kidney disease: Secondary | ICD-10-CM | POA: Diagnosis not present

## 2018-02-17 DIAGNOSIS — Z880 Allergy status to penicillin: Secondary | ICD-10-CM | POA: Diagnosis not present

## 2018-02-17 DIAGNOSIS — Z79899 Other long term (current) drug therapy: Secondary | ICD-10-CM | POA: Insufficient documentation

## 2018-02-17 HISTORY — PX: BIOPSY: SHX5522

## 2018-02-17 HISTORY — PX: ESOPHAGOGASTRODUODENOSCOPY: SHX5428

## 2018-02-17 HISTORY — PX: EUS: SHX5427

## 2018-02-17 IMAGING — XA Imaging study
1 series · 1 of 1 positions shown · non-contrast
Comparison: none

CLINICAL DATA: Lumbosacral spondylosis without myelopathy. Lumbago.
ESRD

[Series 3: ortho standard · 1 of 1 slices shown]
[im 1/1]
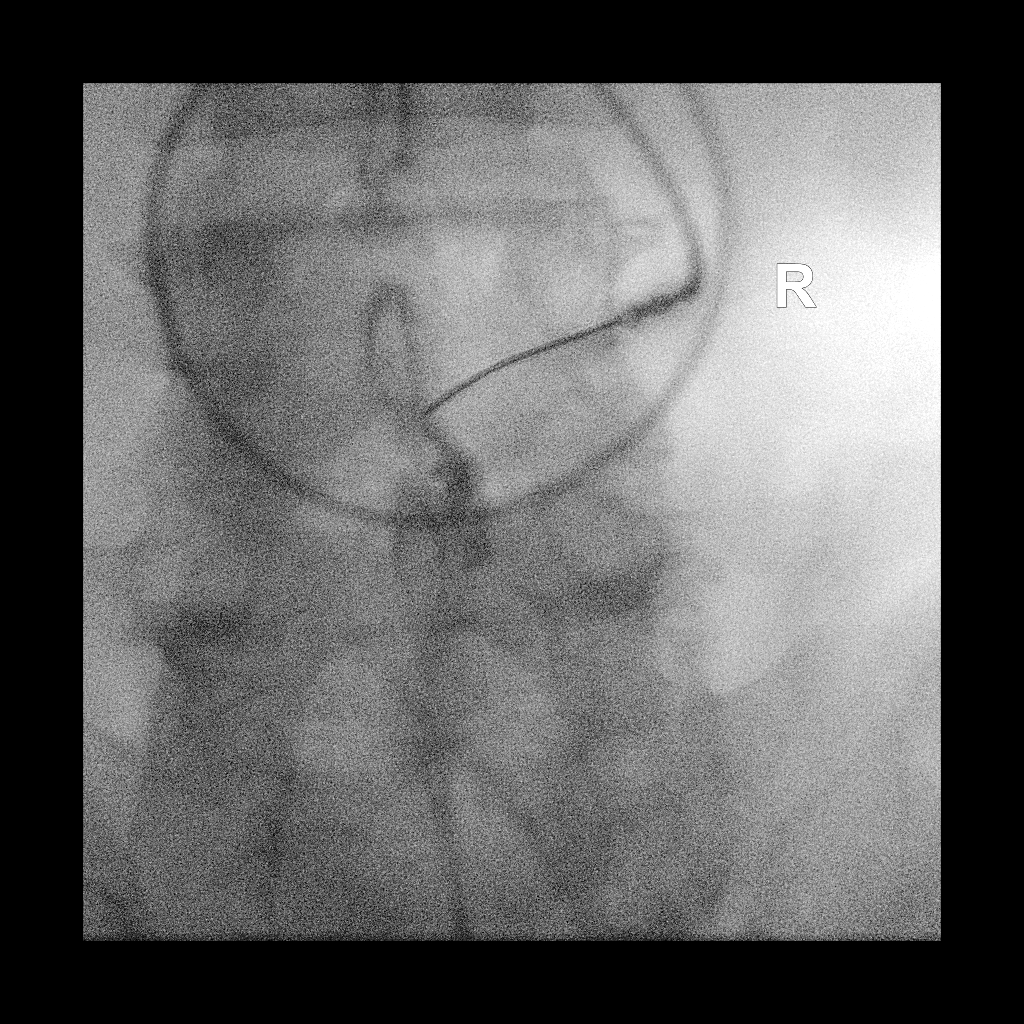

[1 of 1 positions shown; findings below may reference images not displayed]

FLUOROSCOPY TIME:  6 seconds corresponding to a Dose Area Product of
8.17 ?Gy*m2

PROCEDURE:
The procedure, risks, benefits, and alternatives were explained to
the patient. Questions regarding the procedure were encouraged and
answered. The patient understands and consents to the procedure.
Time-out was performed.

An appropriate skin entry site was chosen, cleansed with Betadine,
and anesthetized with 1% lidocaine.

LUMBAR EPIDURAL INJECTION:

An interlaminar approach was performed on RIGHT at L4-5. The
overlying skin was cleansed and anesthetized. A 20 gauge epidural
needle was advanced using loss-of-resistance technique.

DIAGNOSTIC EPIDURAL INJECTION:

Injection of Isovue-M 200 shows a good epidural pattern with spread
above and below the level of needle placement, primarily on the
RIGHT; no vascular opacification is seen.

THERAPEUTIC EPIDURAL INJECTION:

120.0 Mg of Depo-Medrol mixed with 2 mL 1% lidocaine were instilled.
The procedure was well-tolerated, and the patient was discharged
thirty minutes following the injection in good condition.

COMPLICATIONS:
None.
IMPRESSION: Technically successful epidural injection on the RIGHT L4-5 # 1.

## 2018-02-17 SURGERY — UPPER ENDOSCOPIC ULTRASOUND (EUS) RADIAL
Anesthesia: Monitor Anesthesia Care

## 2018-02-17 MED ORDER — PROPOFOL 10 MG/ML IV BOLUS
INTRAVENOUS | Status: DC | PRN
  Administered 2018-02-17: 20 mg via INTRAVENOUS
  Administered 2018-02-17: 40 mg via INTRAVENOUS
  Administered 2018-02-17 (×8): 20 mg via INTRAVENOUS

## 2018-02-17 MED ORDER — PROPOFOL 500 MG/50ML IV EMUL
INTRAVENOUS | Status: DC | PRN
  Administered 2018-02-17: 100 ug/kg/min via INTRAVENOUS

## 2018-02-17 MED ORDER — LIDOCAINE 2% (20 MG/ML) 5 ML SYRINGE
INTRAMUSCULAR | Status: DC | PRN
  Administered 2018-02-17: 60 mg via INTRAVENOUS

## 2018-02-17 MED ORDER — SODIUM CHLORIDE 0.9 % IV SOLN
INTRAVENOUS | Status: DC
  Administered 2018-02-17: 08:00:00 via INTRAVENOUS

## 2018-02-17 MED ORDER — PROPOFOL 10 MG/ML IV BOLUS
INTRAVENOUS | Status: AC
  Filled 2018-02-17: qty 60

## 2018-02-17 MED ORDER — ONDANSETRON HCL 4 MG/2ML IJ SOLN
INTRAMUSCULAR | Status: DC | PRN
  Administered 2018-02-17: 4 mg via INTRAVENOUS

## 2018-02-17 NOTE — Op Note (Addendum)
[facility] Patient Name: Jeffrey [name] Procedure Date: 02/17/2018 MRN: [id number] Attending MD: Milus Banister , MD Date of Birth: [birth year] CSN: [id number] Age: 62 Admit Type: Inpatient Procedure:                Upper EUS Indications:              Recent IDA workup; EGD Dr. 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 [name] Medicines:                Monitored Anesthesia Care Complications:            No immediate complications. Estimated blood loss:                            None. Estimated Blood Loss:     Estimated blood loss: none. Procedure:                Pre-Anesthesia Assessment:                           - Prior to the procedure, a History and Physical                            was performed, and patient medications and                            allergies were reviewed. The patient's tolerance of                            previous anesthesia was also reviewed. The risks                            and benefits of the procedure and the sedation                            options and risks were discussed with the patient.                            All questions were answered, and informed consent  was obtained. Prior Anticoagulants: The patient has                            taken no previous anticoagulant or antiplatelet                            agents. ASA Grade Assessment: IV - A patient with                            severe systemic disease that is a constant threat                            to life. After reviewing the risks and benefits,                            the  patient was deemed in satisfactory condition to                            undergo the procedure.                           After obtaining informed consent, the endoscope was                            passed under direct vision. Throughout the                            procedure, the patient's blood pressure, pulse, and                            oxygen saturations were monitored continuously. The                            [id number] ([id number]) scope was introduced through                            the mouth, and advanced to the third part of                            duodenum. The upper EUS was accomplished without                            difficulty. The patient tolerated the procedure                            well. Scope In: Scope Out: Findings:      ENDOSCOPIC FINDING (using radial echoendoscope and side viewing       duodenoscope) :      1. Large (3-4cm) friable, villous appearing, soft mass that directly       abuts the major papilla and extends distally from the papilla into third       duodenum. This was biospied with forceps extensively.      2. UGI tract was otherwise normal.      ENDOSONOGRAPHIC FINDING: :  1. The periampullary duodenal mass described above correlated with a       hypoechoic, heterogeneous lesion that measures 3cm maximally by EUS. The       mass does not show clear signs of invasion into the muscularis priopria       layer of the duodenal wall and clearly does not invade into the pancreas.      2. CBD was normal, non-dilated.      3. Pancreatic parenchyma was normal throughout.      4. Main pancreatic duct was normal.      5. No peripancreatic, periportal adenopathy.      6. Limited views of the liver, spleen, portal and splenic vessels were       all normal. Impression:               - 3-4cm friable but soft duodenal mass which                            directly abuts the major papilla and extends                            distally into  D3. EUS evaluation does not show                            clear evidence of duodenal muscularis priopria or                            pancreatic invasion and so if this does harbor                            carcinoma it is likely early stage ([id number]). The                            mass was sampled with forceps.                           - This mass is very likely contributing to his                            anemia. Moderate Sedation:      N/A- Per Anesthesia Care Recommendation:           - Discharge patient to home (ambulatory).                           - Await path results. If frank cancer is noted on                            path he should consider formal surgical resection (                            Whipple surgery). If no frank cancer is noted on                            path then he will need referal to tertiary GI  center to consider endoscopic resection. Procedure Code(s):        --- Professional ---                           [phone], Esophagogastroduodenoscopy, flexible,                            transoral; with endoscopic ultrasound examination                            limited to the esophagus, stomach or duodenum, and                            adjacent structures                           52080, 56, Esophagogastroduodenoscopy, flexible,                            transoral; with biopsy, single or multiple Diagnosis Code(s):        --- Professional ---                           K31.89, Other diseases of stomach and duodenum CPT copyright 2017 [employer]. All rights reserved. The codes documented in this report are preliminary and upon coder review may  be revised to meet current compliance requirements. Milus Banister, MD 02/17/2018 8:59:56 AM This report has been signed electronically. Number of Addenda: 0

## 2018-02-17 NOTE — Anesthesia Preprocedure Evaluation (Addendum)
Anesthesia Evaluation  Patient identified by MRN, date of birth, ID band Patient awake    Reviewed: Allergy & Precautions, NPO status , Patient's Chart, lab work & pertinent test results, reviewed documented beta blocker date and time   Airway Mallampati: II  TM Distance: >3 FB Neck ROM: Full    Dental  (+) Edentulous Upper, Edentulous Lower   Pulmonary asthma , COPD,  COPD inhaler, former smoker,    Pulmonary exam normal        Cardiovascular Exercise Tolerance: Good hypertension, Pt. on medications and Pt. on home beta blockers Normal cardiovascular exam+ Valvular Problems/Murmurs      Neuro/Psych Seizures -, Well Controlled,  PSYCHIATRIC DISORDERS Anxiety    GI/Hepatic negative GI ROS, (+) Hepatitis -, B  Endo/Other  negative endocrine ROS  Renal/GU ESRF and DialysisRenal diseaseDialysis Mon     Musculoskeletal  (+) Arthritis ,   Abdominal   Peds  Hematology  (+) anemia , HIV,   Anesthesia Other Findings Past Medical History: No date: Anemia No date: Anxiety No date: Arthritis No date: Asthma     Comment:  per pt hx 07/10/2011: End stage renal disease on home HD     Comment:  Started HD in September 2012 at [facility] with a tunneled HD               catheter, now on home HD with [facility]. Dialyzing through              AVF L lower arm with buttonhole technique as of mid 2014.              His brother does the HD treatments at home.  They are               roommates for 23 years.  The brother works 3rd shift and               gets off about 8am and then puts Mr Christopher on HD in the               morning after getting home. Most of the time he does HD               about 4 times a week, for about 4 hours per treatment.               Cause of ESRD was HTN according to patient. He says he               let his health go and ending up with complications, and               that he didn't like seeing doctors in those  days.  He               says he was diagnosed with severe HTN when he lived in               [place] in his 52's.  No date: Hepatitis B carrier ([place]) No date: HIV infection ([place]) No date: Hypertension No date: Hyperthyroidism     Comment:  normal now several yrs ago: Pneumonia No date: Seizure ([place]) 06/02/2011: Seizures ([place])     Comment:  x 1 none since No date: Thrombocytopenia ([place])   Reproductive/Obstetrics                            Anesthesia Physical  Anesthesia Plan  ASA: III  Anesthesia Plan: MAC   Post-op Pain Management:    Induction: Intravenous  PONV Risk Score and Plan: 2 and Propofol infusion, Ondansetron and Treatment may vary due to age or medical condition  Airway Management Planned: Nasal Cannula  Additional Equipment: None  Intra-op Plan:   Post-operative Plan:   Informed Consent: I have reviewed the patients History and Physical, chart, labs and discussed the procedure including the risks, benefits and alternatives for the proposed anesthesia with the patient or authorized representative who has indicated his/her understanding and acceptance.     Plan Discussed with: CRNA and Surgeon  Anesthesia Plan Comments:         Anesthesia Quick Evaluation

## 2018-02-17 NOTE — Interval H&P Note (Signed)
History and Physical Interval Note:  02/17/2018 7:17 AM  Jeffrey [name]  has presented today for surgery, with the diagnosis of duodenal mass  The various methods of treatment have been discussed with the patient and family. After consideration of risks, benefits and other options for treatment, the patient has consented to  Procedure(s): UPPER ENDOSCOPIC ULTRASOUND (EUS) RADIAL (N/A) as a surgical intervention .  The patient's history has been reviewed, patient examined, no change in status, stable for surgery.  I have reviewed the patient's chart and labs.  Questions were answered to the patient's satisfaction.     Milus Banister

## 2018-02-17 NOTE — Discharge Instructions (Signed)
YOU HAD AN ENDOSCOPIC PROCEDURE TODAY: Refer to the procedure report and other information in the discharge instructions given to you for any specific questions about what was found during the examination. If this information does not answer your questions, please call [place] office at [phone] to clarify.   YOU SHOULD EXPECT: Some feelings of bloating in the abdomen. Passage of more gas than usual. Walking can help get rid of the air that was put into your GI tract during the procedure and reduce the bloating. If you had a lower endoscopy (such as a colonoscopy or flexible sigmoidoscopy) you may notice spotting of blood in your stool or on the toilet paper. Some abdominal soreness may be present for a day or two, also.  DIET: Your first meal following the procedure should be a light meal and then it is ok to progress to your normal diet. A half-sandwich or bowl of soup is an example of a good first meal. Heavy or fried foods are harder to digest and may make you feel nauseous or bloated. Drink plenty of fluids but you should avoid alcoholic beverages for 24 hours. If you had a esophageal dilation, please see attached instructions for diet.    ACTIVITY: Your care partner should take you home directly after the procedure. You should plan to take it easy, moving slowly for the rest of the day. You can resume normal activity the day after the procedure however YOU SHOULD NOT DRIVE, use power tools, machinery or perform tasks that involve climbing or major physical exertion for 24 hours (because of the sedation medicines used during the test).   SYMPTOMS TO REPORT IMMEDIATELY: A gastroenterologist can be reached at any hour. Please call [phone]  for any of the following symptoms:   Following upper endoscopy (EGD, EUS, ERCP, esophageal dilation) Vomiting of blood or coffee ground material  New, significant abdominal pain  New, significant chest pain or pain under the shoulder blades  Painful or  persistently difficult swallowing  New shortness of breath  Black, tarry-looking or red, bloody stools  FOLLOW UP:  If any biopsies were taken you will be contacted by phone or by letter within the next 1-3 weeks. Call [phone]  if you have not heard about the biopsies in 3 weeks.  Please also call with any specific questions about appointments or follow up tests.  

## 2018-02-17 NOTE — Transfer of Care (Signed)
Immediate Anesthesia Transfer of Care Note  Patient: Jeffrey [name]  Procedure(s) Performed: UPPER ENDOSCOPIC ULTRASOUND (EUS) RADIAL (N/A ) BIOPSY  Patient Location: PACU  Anesthesia Type:MAC  Level of Consciousness: sedated  Airway & Oxygen Therapy: Patient Spontanous Breathing and Patient connected to nasal cannula oxygen  Post-op Assessment: Report given to RN and Post -op Vital signs reviewed and stable  Post vital signs: Reviewed and stable  Last Vitals:  Vitals Value Taken Time  BP    Temp    Pulse    Resp    SpO2      Last Pain:  Vitals:   02/17/18 0735  TempSrc: Oral  PainSc: 0-No pain         Complications: No apparent anesthesia complications

## 2018-02-17 NOTE — Anesthesia Postprocedure Evaluation (Signed)
Anesthesia Post Note  Patient: Jeffrey [name]  Procedure(s) Performed: UPPER ENDOSCOPIC ULTRASOUND (EUS) RADIAL (N/A ) BIOPSY     Patient location during evaluation: PACU Anesthesia Type: MAC Level of consciousness: awake and alert Pain management: pain level controlled Vital Signs Assessment: post-procedure vital signs reviewed and stable Respiratory status: spontaneous breathing, nonlabored ventilation, respiratory function stable and patient connected to nasal cannula oxygen Cardiovascular status: stable and blood pressure returned to baseline Postop Assessment: no apparent nausea or vomiting Anesthetic complications: no    Last Vitals:  Vitals:   02/17/18 0920 02/17/18 0930  BP: (!) 179/59 (!) 206/55  Pulse: (!) 55 (!) 55  Resp: 17 17  Temp:    SpO2: 99% 100%    Last Pain:  Vitals:   02/17/18 0735  TempSrc: Oral  PainSc: 0-No pain                 Tiajuana Amass

## 2018-02-18 DIAGNOSIS — N186 End stage renal disease: Secondary | ICD-10-CM | POA: Diagnosis not present

## 2018-02-18 DIAGNOSIS — Z992 Dependence on renal dialysis: Secondary | ICD-10-CM | POA: Diagnosis not present

## 2018-02-21 ENCOUNTER — Emergency Department (HOSPITAL_COMMUNITY): Payer: Medicare Other

## 2018-02-21 ENCOUNTER — Inpatient Hospital Stay (HOSPITAL_COMMUNITY)
Admission: EM | Admit: 2018-02-21 | Discharge: 2018-02-28 | DRG: 100 | Disposition: A | Discharge status: Home / Self Care | Payer: Medicare Other | Attending: Oncology | Admitting: Oncology

## 2018-02-21 ENCOUNTER — Telehealth: Payer: Self-pay | Admitting: Gastroenterology

## 2018-02-21 ENCOUNTER — Other Ambulatory Visit: Payer: Self-pay

## 2018-02-21 ENCOUNTER — Encounter (HOSPITAL_COMMUNITY): Payer: Self-pay | Admitting: Gastroenterology

## 2018-02-21 DIAGNOSIS — G40409 Other generalized epilepsy and epileptic syndromes, not intractable, without status epilepticus: Principal | ICD-10-CM | POA: Diagnosis present

## 2018-02-21 DIAGNOSIS — D631 Anemia in chronic kidney disease: Secondary | ICD-10-CM | POA: Diagnosis present

## 2018-02-21 DIAGNOSIS — Z21 Asymptomatic human immunodeficiency virus [HIV] infection status: Secondary | ICD-10-CM | POA: Diagnosis present

## 2018-02-21 DIAGNOSIS — M1990 Unspecified osteoarthritis, unspecified site: Secondary | ICD-10-CM | POA: Diagnosis present

## 2018-02-21 DIAGNOSIS — Z452 Encounter for adjustment and management of vascular access device: Secondary | ICD-10-CM

## 2018-02-21 DIAGNOSIS — Z6823 Body mass index (BMI) 23.0-23.9, adult: Secondary | ICD-10-CM

## 2018-02-21 DIAGNOSIS — G9349 Other encephalopathy: Secondary | ICD-10-CM | POA: Diagnosis present

## 2018-02-21 DIAGNOSIS — R58 Hemorrhage, not elsewhere classified: Secondary | ICD-10-CM | POA: Diagnosis not present

## 2018-02-21 DIAGNOSIS — F419 Anxiety disorder, unspecified: Secondary | ICD-10-CM | POA: Diagnosis present

## 2018-02-21 DIAGNOSIS — I10 Essential (primary) hypertension: Secondary | ICD-10-CM | POA: Diagnosis present

## 2018-02-21 DIAGNOSIS — R4182 Altered mental status, unspecified: Secondary | ICD-10-CM | POA: Diagnosis not present

## 2018-02-21 DIAGNOSIS — E8770 Fluid overload, unspecified: Secondary | ICD-10-CM | POA: Diagnosis present

## 2018-02-21 DIAGNOSIS — R569 Unspecified convulsions: Secondary | ICD-10-CM | POA: Diagnosis not present

## 2018-02-21 DIAGNOSIS — R402212 Coma scale, best verbal response, none, at arrival to emergency department: Secondary | ICD-10-CM | POA: Diagnosis present

## 2018-02-21 DIAGNOSIS — I1311 Hypertensive heart and chronic kidney disease without heart failure, with stage 5 chronic kidney disease, or end stage renal disease: Secondary | ICD-10-CM | POA: Diagnosis present

## 2018-02-21 DIAGNOSIS — R531 Weakness: Secondary | ICD-10-CM | POA: Diagnosis not present

## 2018-02-21 DIAGNOSIS — D696 Thrombocytopenia, unspecified: Secondary | ICD-10-CM | POA: Diagnosis present

## 2018-02-21 DIAGNOSIS — E1122 Type 2 diabetes mellitus with diabetic chronic kidney disease: Secondary | ICD-10-CM | POA: Diagnosis present

## 2018-02-21 DIAGNOSIS — K3189 Other diseases of stomach and duodenum: Secondary | ICD-10-CM | POA: Diagnosis present

## 2018-02-21 DIAGNOSIS — Z992 Dependence on renal dialysis: Secondary | ICD-10-CM

## 2018-02-21 DIAGNOSIS — Z9115 Patient's noncompliance with renal dialysis: Secondary | ICD-10-CM

## 2018-02-21 DIAGNOSIS — R011 Cardiac murmur, unspecified: Secondary | ICD-10-CM | POA: Diagnosis present

## 2018-02-21 DIAGNOSIS — I120 Hypertensive chronic kidney disease with stage 5 chronic kidney disease or end stage renal disease: Secondary | ICD-10-CM | POA: Diagnosis not present

## 2018-02-21 DIAGNOSIS — Z79899 Other long term (current) drug therapy: Secondary | ICD-10-CM

## 2018-02-21 DIAGNOSIS — R402312 Coma scale, best motor response, none, at arrival to emergency department: Secondary | ICD-10-CM | POA: Diagnosis not present

## 2018-02-21 DIAGNOSIS — E872 Acidosis, unspecified: Secondary | ICD-10-CM

## 2018-02-21 DIAGNOSIS — R55 Syncope and collapse: Secondary | ICD-10-CM | POA: Diagnosis not present

## 2018-02-21 DIAGNOSIS — B181 Chronic viral hepatitis B without delta-agent: Secondary | ICD-10-CM | POA: Diagnosis present

## 2018-02-21 DIAGNOSIS — Z833 Family history of diabetes mellitus: Secondary | ICD-10-CM

## 2018-02-21 DIAGNOSIS — N186 End stage renal disease: Secondary | ICD-10-CM | POA: Diagnosis present

## 2018-02-21 DIAGNOSIS — N2581 Secondary hyperparathyroidism of renal origin: Secondary | ICD-10-CM | POA: Diagnosis present

## 2018-02-21 DIAGNOSIS — Z7982 Long term (current) use of aspirin: Secondary | ICD-10-CM

## 2018-02-21 DIAGNOSIS — D372 Neoplasm of uncertain behavior of small intestine: Secondary | ICD-10-CM | POA: Diagnosis present

## 2018-02-21 DIAGNOSIS — Z781 Physical restraint status: Secondary | ICD-10-CM

## 2018-02-21 DIAGNOSIS — Z87891 Personal history of nicotine dependence: Secondary | ICD-10-CM

## 2018-02-21 DIAGNOSIS — R402112 Coma scale, eyes open, never, at arrival to emergency department: Secondary | ICD-10-CM | POA: Diagnosis present

## 2018-02-21 DIAGNOSIS — R0989 Other specified symptoms and signs involving the circulatory and respiratory systems: Secondary | ICD-10-CM | POA: Diagnosis not present

## 2018-02-21 DIAGNOSIS — E440 Moderate protein-calorie malnutrition: Secondary | ICD-10-CM

## 2018-02-21 DIAGNOSIS — N189 Chronic kidney disease, unspecified: Secondary | ICD-10-CM

## 2018-02-21 DIAGNOSIS — G40909 Epilepsy, unspecified, not intractable, without status epilepticus: Secondary | ICD-10-CM | POA: Diagnosis not present

## 2018-02-21 DIAGNOSIS — G40901 Epilepsy, unspecified, not intractable, with status epilepticus: Secondary | ICD-10-CM | POA: Diagnosis not present

## 2018-02-21 DIAGNOSIS — R0902 Hypoxemia: Secondary | ICD-10-CM | POA: Diagnosis present

## 2018-02-21 DIAGNOSIS — J45909 Unspecified asthma, uncomplicated: Secondary | ICD-10-CM | POA: Diagnosis present

## 2018-02-21 DIAGNOSIS — Z8249 Family history of ischemic heart disease and other diseases of the circulatory system: Secondary | ICD-10-CM

## 2018-02-21 DIAGNOSIS — Z9861 Coronary angioplasty status: Secondary | ICD-10-CM

## 2018-02-21 DIAGNOSIS — B20 Human immunodeficiency virus [HIV] disease: Secondary | ICD-10-CM | POA: Diagnosis present

## 2018-02-21 DIAGNOSIS — Y841 Kidney dialysis as the cause of abnormal reaction of the patient, or of later complication, without mention of misadventure at the time of the procedure: Secondary | ICD-10-CM | POA: Diagnosis not present

## 2018-02-21 DIAGNOSIS — N19 Unspecified kidney failure: Secondary | ICD-10-CM

## 2018-02-21 DIAGNOSIS — T82838A Hemorrhage of vascular prosthetic devices, implants and grafts, initial encounter: Secondary | ICD-10-CM | POA: Diagnosis not present

## 2018-02-21 LAB — I-STAT VENOUS BLOOD GAS, ED
ACID-BASE DEFICIT: 16 mmol/L — AB (ref 0.0–2.0)
Bicarbonate: 12.7 mmol/L — ABNORMAL LOW (ref 20.0–28.0)
O2 SAT: 94 %
TCO2: 14 mmol/L — ABNORMAL LOW (ref 22–32)
pCO2, Ven: 40.8 mmHg — ABNORMAL LOW (ref 44.0–60.0)
pH, Ven: 7.1 — CL (ref 7.250–7.430)
pO2, Ven: 93 mmHg — ABNORMAL HIGH (ref 32.0–45.0)

## 2018-02-21 LAB — CBC WITH DIFFERENTIAL/PLATELET
BASOS ABS: 0.1 10*3/uL (ref 0.0–0.1)
Basophils Relative: 1 %
EOS ABS: 0.2 10*3/uL (ref 0.0–0.7)
Eosinophils Relative: 3 %
HCT: 29.1 % — ABNORMAL LOW (ref 39.0–52.0)
HEMOGLOBIN: 9.4 g/dL — AB (ref 13.0–17.0)
Lymphocytes Relative: 33 %
Lymphs Abs: 2.6 10*3/uL (ref 0.7–4.0)
MCH: 32 pg (ref 26.0–34.0)
MCHC: 32.3 g/dL (ref 30.0–36.0)
MCV: 99 fL (ref 78.0–100.0)
Monocytes Absolute: 0.6 10*3/uL (ref 0.1–1.0)
Monocytes Relative: 7 %
NEUTROS PCT: 56 %
Neutro Abs: 4.4 10*3/uL (ref 1.7–7.7)
PLATELETS: 152 10*3/uL (ref 150–400)
RBC: 2.94 MIL/uL — ABNORMAL LOW (ref 4.22–5.81)
RDW: 25.2 % — ABNORMAL HIGH (ref 11.5–15.5)
WBC: 7.9 10*3/uL (ref 4.0–10.5)

## 2018-02-21 LAB — I-STAT TROPONIN, ED: TROPONIN I, POC: 0.06 ng/mL (ref 0.00–0.08)

## 2018-02-21 LAB — I-STAT CHEM 8, ED
BUN: >140 mg/dL — ABNORMAL HIGH (ref 6–20)
Calcium, Ion: 0.93 mmol/L — ABNORMAL LOW (ref 1.15–1.40)
Chloride: 98 mmol/L — ABNORMAL LOW (ref 101–111)
Creatinine, Ser: >18 mg/dL — ABNORMAL HIGH (ref 0.61–1.24)
GLUCOSE: 115 mg/dL — AB (ref 65–99)
HEMATOCRIT: 31 % — AB (ref 39.0–52.0)
HEMOGLOBIN: 10.5 g/dL — AB (ref 13.0–17.0)
POTASSIUM: 4.4 mmol/L (ref 3.5–5.1)
Sodium: 131 mmol/L — ABNORMAL LOW (ref 135–145)
TCO2: 15 mmol/L — AB (ref 22–32)

## 2018-02-21 LAB — I-STAT CG4 LACTIC ACID, ED
LACTIC ACID, VENOUS: 7.17 mmol/L — AB (ref 0.5–1.9)
Lactic Acid, Venous: 3.89 mmol/L (ref 0.5–1.9)

## 2018-02-21 MED ORDER — LEVETIRACETAM IN NACL 1000 MG/100ML IV SOLN
1000.0000 mg | Freq: Once | INTRAVENOUS | Status: AC
  Administered 2018-02-21: 1000 mg via INTRAVENOUS
  Filled 2018-02-21: qty 100

## 2018-02-21 MED ORDER — SODIUM CHLORIDE 0.9 % IV SOLN
INTRAVENOUS | Status: DC
  Administered 2018-02-21 – 2018-02-23 (×2): via INTRAVENOUS

## 2018-02-21 MED ORDER — LORAZEPAM 2 MG/ML IJ SOLN
1.0000 mg | Freq: Once | INTRAMUSCULAR | Status: AC
  Administered 2018-02-21: 1 mg via INTRAVENOUS
  Filled 2018-02-21: qty 1

## 2018-02-21 MED ORDER — LORAZEPAM 2 MG/ML IJ SOLN
INTRAMUSCULAR | Status: AC
  Filled 2018-02-21: qty 1

## 2018-02-21 MED ORDER — LORAZEPAM 2 MG/ML IJ SOLN
2.0000 mg | Freq: Once | INTRAMUSCULAR | Status: AC
Start: 2018-02-21 — End: 2018-02-21
  Administered 2018-02-21: 2 mg via INTRAVENOUS
  Filled 2018-02-21: qty 1

## 2018-02-21 NOTE — ED Provider Notes (Signed)
[place] EMERGENCY DEPARTMENT Provider Note   CSN: [id number] Arrival date & time: 02/21/18  1904     History   Chief Complaint Chief Complaint  Patient presents with  . Fatigue    HPI Jeffrey [name] is a 62 y.o. male.  HPI  Patient presents for evaluation of weakness, by EMS.  After arrival, prior to being seen by emergency providers, the patient had a generalized seizure.  Nursing report that this was a tonic-clonic seizure lasting 1-1/2 minutes.  He received IM Ativan, around the time that the seizure abated.  Patient was postictal after the seizure.  Patient recently had upper endoscopy to evaluate GI bleeding and was found to have a duodenal lesion probably contributing to the bleeding.  The lesion is complicated therefore he was referred to a tertiary care center which has not happened yet.  Patient is unable to give any history.  On dialysis, last 5 days ago.  Level 5 caveat-altered mental status   Past Medical History:  Diagnosis Date  . Anemia   . Anxiety   . Arthritis   . Asthma    per pt hx  . End stage renal disease on home HD 07/10/2011   Started HD in September 2012 at [facility] with a tunneled HD catheter, now on home HD with NxtStage. Dialyzing through AVF L lower arm with buttonhole technique as of mid 2014. His brother does the HD treatments at home.  They are roommates for 23 years.  The brother works 3rd shift and gets off about 8am and then puts Mr Economou on HD in the morning after getting home. Most of the time he does HD about 4 times a week, for about 4 hours per treatment. Cause of ESRD was HTN according to patient. He says he let his health go and ending up with complications, and that he didn't like seeing doctors in those days.  He says he was diagnosed with severe HTN when he lived in [place] in his 10's.   . Hepatitis B carrier ([place])   . HIV infection ([place])   . Hypertension   . Hyperthyroidism    normal now  . Pneumonia several yrs  ago  . Seizure ([place])   . Seizures ([place]) 06/02/2011   x 1 none since  . Thrombocytopenia [facility])     Patient Active Problem List   Diagnosis Date Noted  . Mass of duodenum   . Anemia of chronic kidney failure, unspecified stage   . Infectious joint disease 10/04/2017  . Symptomatic anemia 09/23/2017  . Antibiotic long-term use   . Septic arthritis ([place]) 09/15/2017  . Encounter for incision and drainage procedure 09/15/2017  . Effusion of left knee 09/08/2017  . Intertrigo 07/09/2017  . AC joint arthropathy 06/16/2017  . Pseudoaneurysm of arteriovenous dialysis fistula ([place]) 12/23/2016  . Eczema 12/10/2016  . Bilateral low back pain without sciatica   . Aortic valve insufficiency   . Fall 09/08/2016  . Knee abrasion, right, initial encounter 09/08/2016  . Lumbosacral spondylosis without myelopathy 08/03/2016  . Pulmonary nodules 08/03/2016  . Essential hypertension   . Anemia of chronic disease 04/08/2016  . Secondary hyperparathyroidism ([place]) 02/09/2014  . Asthma 02/09/2014  . Nuclear sclerotic cataract of both eyes 12/12/2013  . Myopia with presbyopia of both eyes 10/26/2013  . COPD (chronic obstructive pulmonary disease) ([place]) 06/27/2013  . Thrombocytopenia ([place]) 01/09/2013  . HBV (hepatitis B virus) infection 01/09/2013  . Anxiety 07/22/2011  . End stage  renal disease on home HD 07/10/2011  . HIV disease ([place]) 06/17/2011    Past Surgical History:  Procedure Laterality Date  . AV FISTULA PLACEMENT  06/02/11   Left radiocephalic AVF  . BIOPSY  02/17/2018   Procedure: BIOPSY;  Surgeon: Milus Banister, MD;  Location: [facility] ENDOSCOPY;  Service: Endoscopy;;  . COLONOSCOPY    . COLONOSCOPY WITH PROPOFOL N/A 01/27/2018   Procedure: COLONOSCOPY WITH PROPOFOL;  Surgeon: [name], MD;  Location[facility] ENDOSCOPY;  Service: Gastroenterology;  Laterality: N/A;  . CORONARY ANGIOPLASTY    . ESOPHAGOGASTRODUODENOSCOPY N/A 02/17/2018   Procedure: ESOPHAGOGASTRODUODENOSCOPY (EGD);   Surgeon: Milus Banister, MD;  Location: [facility] ENDOSCOPY;  Service: Endoscopy;  Laterality: N/A;  . ESOPHAGOGASTRODUODENOSCOPY (EGD) WITH PROPOFOL N/A 01/27/2018   Procedure: ESOPHAGOGASTRODUODENOSCOPY (EGD) WITH PROPOFOL;  Surgeon: [name], MD;  Location: [facility] ENDOSCOPY;  Service: Gastroenterology;  Laterality: N/A;  . EUS N/A 02/17/2018   Procedure: UPPER ENDOSCOPIC ULTRASOUND (EUS) RADIAL;  Surgeon: Milus Banister, MD;  Location: [facility] ENDOSCOPY;  Service: Endoscopy;  Laterality: N/A;  . fistulaogram     x 2 last 2 years  . IR FLUORO GUIDE CV LINE RIGHT  09/16/2017  . IR US GUIDE VASC ACCESS RIGHT  09/16/2017  . IRRIGATION AND DEBRIDEMENT KNEE Left 09/15/2017   Procedure: IRRIGATION AND DEBRIDEMENT KNEE; arthroscopic clean out;  Surgeon: [name], MD;  Location: [facility] ORS;  Service: Orthopedics;  Laterality: Left;  . OTHER SURGICAL HISTORY     removal temporary HD catheter   . REVISON OF ARTERIOVENOUS FISTULA Left 10/10/2015   Procedure: REVISON OF LEFT RADIOCEPHALIC ARTERIOVENOUS FISTULA;  Surgeon: Angelia Mould, MD;  Location: [place];  Service: Vascular;  Laterality: Left;  . REVISON OF ARTERIOVENOUS FISTULA Left 02/07/2016   Procedure: REPAIR OF PSEUDO-ANEUREYSM OF LEFT ARM  ARTERIOVENOUS FISTULA;  Surgeon: Angelia Mould, MD;  Location: [place];  Service: Vascular;  Laterality: Left;  . REVISON OF ARTERIOVENOUS FISTULA Left 1/74/0814   Procedure: PLICATION OF LEFT ARM RADIOCEPHALIC ARTERIOVENOUS FISTULA PSEUDOANEURYSM;  Surgeon: Angelia Mould, MD;  Location: [place];  Service: Vascular;  Laterality: Left;  . RIGHT/LEFT HEART CATH AND CORONARY ANGIOGRAPHY N/A 02/17/2017   Procedure: Right/Left Heart Cath and Coronary Angiography;  Surgeon: Sherren Mocha, MD;  Location: [place] CV LAB;  Service: Cardiovascular;  Laterality: N/A;  . TEE WITHOUT CARDIOVERSION N/A 09/11/2016   Procedure: TRANSESOPHAGEAL ECHOCARDIOGRAM (TEE);  Surgeon: Dorothy Spark, MD;  Location:  [facility] ENDOSCOPY;  Service: Cardiovascular;  Laterality: N/A;        Home Medications    Prior to Admission medications   Medication Sig Start Date End Date Taking? Authorizing Provider  albuterol (PROVENTIL) (2.5 MG/3ML) 0.083% nebulizer solution Take 2.5 mg by nebulization every 6 (six) hours as needed for wheezing or shortness of breath.   Yes [provider]  ALPRAZolam (XANAX) 0.25 MG tablet Take 0.25 mg by mouth 3 (three) times daily as needed.  11/29/17  Yes [provider]  aspirin 81 MG chewable tablet Chew 1 tablet (81 mg total) by mouth daily. 10/15/17  Yes Vaughan Basta, MD  bictegravir-emtricitabine-tenofovir AF (BIKTARVY) 50-200-25 MG TABS tablet Take 1 tablet by mouth daily. 12/27/17  Yes Campbell Riches, MD  calcium acetate (PHOSLO) 667 MG capsule Take 667-2,001 mg by mouth See admin instructions. 1,334-2,001 mg three times a day with each meal and 667-1,334 mg with each snack 06/06/15  Yes [provider]  carvedilol (COREG) 25 MG tablet Take 1 tablet (25  mg total) by mouth 2 (two) times daily with a meal. 10/05/17  Yes Alphonzo Grieve, MD  cloNIDine (CATAPRES - DOSED IN MG/24 HR) 0.1 mg/24hr patch UNWRAP AND APPLY 1 PATCH TO SKIN EVERY 7 DAYS 01/28/18  Yes Velna Ochs, MD  clotrimazole-betamethasone (LOTRISONE) cream Apply 1 application topically 2 (two) times daily. Patient taking differently: Apply 1 application topically 2 (two) times daily as needed (irritation).  09/08/17  Yes Campbell Riches, MD  doxazosin (CARDURA) 4 MG tablet Take 4 mg by mouth at bedtime.  07/04/17  Yes [provider]  epoetin alfa (EPOGEN,PROCRIT) 2000 UNIT/ML injection Inject 2,000 Units into the vein 4 (four) times a week.    Yes [provider]  ethyl chloride spray Apply 1 application topically daily as needed (HD).  05/20/15  Yes [provider]  heparin 1000 UNIT/ML injection Inject 3,000 Units into the vein 4 (four) times a week.     Yes [provider]  hydrALAZINE (APRESOLINE) 100 MG tablet Take 100 mg by mouth 3 (three) times daily.   Yes [provider]  iron sucrose (VENOFER) 20 MG/ML injection Inject 100 mg into the vein once a week. On Monday   Yes [provider]  multivitamin (RENA-VIT) TABS tablet Take 1 tablet by mouth daily.     Yes [provider]  traZODone (DESYREL) 50 MG tablet TAKE 1 TABLET(50 MG) BY MOUTH AT BEDTIME AS NEEDED FOR SLEEP 01/31/18  Yes Campbell Riches, MD  VOLTAREN 1 % GEL APPLY 2 GRAMS EXTERNALLY TO THE AFFECTED AREA FOUR TIMES DAILY Patient taking differently: Apply 2 g topically 3 (three) times daily as needed (pain).  09/08/17  Yes Campbell Riches, MD  omeprazole (PRILOSEC) 20 MG capsule Take 1 capsule (20 mg total) by mouth daily. Patient not taking: Reported on 02/21/2018 01/31/18   [name], MD    Family History Family History  Problem Relation Age of Onset  . Hypertension Mother   . Diabetes Mother   . Cancer Father     Social History Social History   Tobacco Use  . Smoking status: Former Smoker    Years: 10.00    Types: Cigarettes    Last attempt to quit: 08/08/2015    Years since quitting: 2.5  . Smokeless tobacco: Never Used  . Tobacco comment: casual smoking for 10 years  Substance Use Topics  . Alcohol use: No    Alcohol/week: 0.0 oz    Comment: quit 2004  . Drug use: No    Comment: uds (+) cocaine in 08/2011 but pt states was taking sudafed at the time     Allergies   Lisinopril and Penicillins   Review of Systems Review of Systems  Unable to perform ROS: Mental status change     Physical Exam Updated Vital Signs BP (!) 208/118   Pulse 85   Temp 98.6 F (37 C) (Oral)   Resp (!) 21   SpO2 100%   Physical Exam  Constitutional: He appears well-developed.  Appears older than stated age.  HENT:  Head: Normocephalic and atraumatic.  Right Ear: External ear normal.  Left Ear: External ear normal.    Eyes: Pupils are equal, round, and reactive to light. Conjunctivae are normal.  Pupils 2 mm bilaterally  Neck: Normal range of motion and phonation normal. Neck supple.  Cardiovascular: Normal rate and regular rhythm.  Vascular fistula left volar forearm with normal pulse.  Pulmonary/Chest: Effort normal and breath sounds normal. No respiratory distress.  He exhibits no bony tenderness.  Abdominal: Soft. There is no tenderness.  Musculoskeletal: Normal range of motion.  Neurological: He is unresponsive. GCS eye subscore is 1. GCS verbal subscore is 1. GCS motor subscore is 1.  Skin: Skin is warm, dry and intact.  Nursing note and vitals reviewed.    ED Treatments / Results  Labs (all labs ordered are listed, but only abnormal results are displayed) Labs Reviewed  CBC WITH DIFFERENTIAL/PLATELET - Abnormal; Notable for the following components:      Result Value   RBC 2.94 (*)    Hemoglobin 9.4 (*)    HCT 29.1 (*)    RDW 25.2 (*)    All other components within normal limits  I-STAT CG4 LACTIC ACID, ED - Abnormal; Notable for the following components:   Lactic Acid, Venous 7.17 (*)    All other components within normal limits  I-STAT CHEM 8, ED - Abnormal; Notable for the following components:   Sodium 131 (*)    Chloride 98 (*)    BUN >140 (*)    Creatinine, Ser >18.00 (*)    Glucose, Bld 115 (*)    Calcium, Ion 0.93 (*)    TCO2 15 (*)    Hemoglobin 10.5 (*)    HCT 31.0 (*)    All other components within normal limits  I-STAT VENOUS BLOOD GAS, ED - Abnormal; Notable for the following components:   pH, Ven 7.100 (*)    pCO2, Ven 40.8 (*)    pO2, Ven 93.0 (*)    Bicarbonate 12.7 (*)    TCO2 14 (*)    Acid-base deficit 16.0 (*)    All other components within normal limits  I-STAT CG4 LACTIC ACID, ED - Abnormal; Notable for the following components:   Lactic Acid, Venous 3.89 (*)    All other components within normal limits  BLOOD GAS, VENOUS  I-STAT TROPONIN, ED     EKG EKG Interpretation  Date/Time:  Monday February 21 2018 19:07:51 EDT Ventricular Rate:  66 PR Interval:    QRS Duration: 121 QT Interval:  475 QTC Calculation: 498 R Axis:   -54 Text Interpretation:  Sinus rhythm Prolonged PR interval Probable left atrial enlargement Nonspecific IVCD with LAD Left ventricular hypertrophy ST elevation, consider inferior injury Baseline wander in lead(s) II III aVF V1 Since last tracing peaked T waves are present Confirmed by Daleen Bo [phone]) on 02/21/2018 7:33:32 PM   EKG Interpretation  Date/Time:  Monday February 21 2018 19:18:16 EDT Ventricular Rate:  81 PR Interval:    QRS Duration: 124 QT Interval:  421 QTC Calculation: 489 R Axis:   -33 Text Interpretation:  Sinus rhythm Prolonged PR interval Left ventricular hypertrophy ST elevation suggests acute pericarditis Borderline prolonged QT interval Baseline wander in lead(s) III Since last tracing of earlier today No significant change was found Confirmed by Daleen Bo [phone]) on 02/21/2018 7:40:02 PM        Radiology Dg Chest Port 1 View  Result Date: 02/21/2018 CLINICAL DATA:  Seizure. EXAM: PORTABLE CHEST 1 VIEW COMPARISON:  Chest CT 09/24/2017, included lung bases from abdominal CT 02/02/2018 FINDINGS: Despite repeat exam, the left costophrenic angle is excluded from the field of view. The heart is enlarged. Left pleural effusion suboptimally evaluated on portable exam. Mild right infrahilar atelectasis. Mild vascular congestion. No pneumothorax. Chronic change about the left shoulder, partially included. IMPRESSION: 1. Cardiomegaly and vascular congestion. 2. Left pleural effusion not well assessed by portable technique. Electronically Signed  By: Jeb Levering M.D.   On: 02/21/2018 19:58    Procedures .Critical Care Performed by: Daleen Bo, MD Authorized by: Daleen Bo, MD   Critical care provider statement:    Critical care time (minutes):  50   Critical care start  time:  02/21/2018 7:20 PM   Critical care end time:  02/21/2018 11:22 PM   Critical care time was exclusive of:  Separately billable procedures and treating other patients   Critical care was necessary to treat or prevent imminent or life-threatening deterioration of the following conditions:  CNS failure or compromise   Critical care was time spent personally by me on the following activities:  Blood draw for specimens, development of treatment plan with patient or surrogate, discussions with consultants, evaluation of patient's response to treatment, examination of patient, obtaining history from patient or surrogate, ordering and performing treatments and interventions, ordering and review of laboratory studies, pulse oximetry, re-evaluation of patient's condition, review of old charts and ordering and review of radiographic studies   (including critical care time)  Medications Ordered in ED Medications  LORazepam (ATIVAN) 2 MG/ML injection (has no administration in time range)  0.9 %  sodium chloride infusion ( Intravenous New Bag/Given 02/21/18 2111)  LORazepam (ATIVAN) injection 2 mg (has no administration in time range)  levETIRAcetam (KEPPRA) IVPB 1000 mg/100 mL premix (0 mg Intravenous Stopped 02/21/18 2110)  LORazepam (ATIVAN) injection 1 mg (1 mg Intravenous Given 02/21/18 2220)     Initial Impression / Assessment and Plan / ED Course  I have reviewed the triage vital signs and the nursing notes.  Pertinent labs & imaging results that were available during my care of the patient were reviewed by me and considered in my medical decision making (see chart for details).  Clinical Course as of Feb 21 2346  Mon Feb 21, 2018  1937 At this time the patient is beginning to move his left arm somewhat, but not responsive to commands.  He appears to be recovering from seizure, and continues to have normal oxygenation, therefore he has not been intubated yet.  We will continue to observe   [EW]  2008  Patient's brother who is his caregiver, is now here.  The patient is more alert, opens his eyes and turns his head towards his brother, when his brother talks to him.  The patient is not able to converse yet.  He is seen to now move both arms, to his face to rub it.  His brother is able to give additional history at this time.  He called EMS today for his brother because he was weak and unable to do dialysis in the last 2 days.  His brother became concerned that he had lost some more blood, therefore wanted to bring him here for evaluation.  His brother noticed that the patient was having some "tics," which he uses to describe some involuntary muscle twitching of both arms, earlier today.   [EW]  2009 Normal except sodium low, chloride low, BUN elevated, creatinine elevated, glucose elevated, calcium low, CO2 low, hemoglobin low  I-stat Chem 8, ED(!) [EW]  2009 Venous blood gas abnormal with pH low, CO2 low, PO2 high, bicarb low, total CO2 low  I-Stat venous blood gas, ED(!!) [EW]  2009 Elevated  I-Stat CG4 Lactic Acid, ED(!!) [EW]  2009 Normal  I-stat troponin, ED [EW]  2146 Patient was unable to tolerate CT imaging of the brain to rule out CVA or bleeding.  At this time he  is resting, right decubitus position, and does not verbally respond to prodding and voice.  Will order dose of Ativan, just prior to repeating attempt at head imaging.   [EW]  2251 Patient has soiled himself, and is currently being cleaned.  He is cooperating with efforts to get cleaned up, by rolling and holding still when asked to.  He is still not verbalizing responsiveness.  Second attempt at repeat CT scan was able to obtain some images but they are limited.   [EW]  2345 Case discussed with on-call nephrologist, Dr. Augustin Coupe; he states that the patient does not require urgent dialysis, for ongoing encephalopathy, and recurrent onset seizure.   [EW]    Clinical Course User Index [EW] Daleen Bo, MD     Patient Vitals for  the past 24 hrs:  BP Temp Temp src Pulse Resp SpO2  02/21/18 2300 (!) 208/118 - - 85 (!) 21 100 %  02/21/18 2215 (!) 232/104 - - 77 18 98 %  02/21/18 2200 (!) 212/92 - - 75 17 95 %  02/21/18 2145 (!) 219/97 - - 78 (!) 21 100 %  02/21/18 2130 (!) 210/88 - - 74 18 96 %  02/21/18 2115 (!) 214/93 - - 72 17 96 %  02/21/18 2100 (!) 246/114 - - - 16 -  02/21/18 2045 (!) 232/101 - - 75 17 100 %  02/21/18 2030 (!) 215/90 - - 74 16 100 %  02/21/18 2015 (!) 222/91 - - 74 16 100 %  02/21/18 1919 (!) 133/99 98.6 F (37 C) Oral 78 20 99 %  02/21/18 1908 (!) 136/101 - - 66 18 100 %      Medical Decision Making: Weakness, nonspecific, and patient with end-stage renal disease.  Patient had a seizure, on arrival to the ED.  He has been obtunded following seizure, after being postictal and receiving Ativan.  Complete evaluation is pending because of difficulty getting CT imaging.  Patient will require admission for further management and treatment.  CRITICAL CARE-yes Performed by: Daleen Bo   Nursing Notes Reviewed/ Care Coordinated Applicable Imaging Reviewed Interpretation of Laboratory Data incorporated into ED treatment  10:55 PM-Consult complete with internal medicine resident. Patient case explained and discussed.  He agrees to admit patient for further evaluation and treatment. Call ended at 11:05 PM  Plan: Admit    Final Clinical Impressions(s) / ED Diagnoses   Final diagnoses:  Seizure ([place])  Hypertension, unspecified type  End stage renal disease [facility])    ED Discharge Orders    None       Daleen Bo, MD 03/01/18 1226

## 2018-02-21 NOTE — ED Notes (Signed)
Report given to oncoming RN.

## 2018-02-21 NOTE — H&P (Signed)
Date: 02/22/2018               Patient Name:  Jeffrey [name] MRN: [id number]  DOB: Apr 04, 1956 Age / Sex: 62 y.o., male   PCP: Velna Ochs, MD         Medical Service: Internal Medicine Teaching Service         Attending Physician: Dr. Annia Belt, MD    First Contact: Dr. Georgia Lopes Pager: [phone]  Second Contact: Dr. Ledell Noss Pager: [phone]       After Hours (After 5p/  First Contact Pager: [phone]  weekends / holidays): Second Contact Pager: [phone]   Chief Complaint: Generalized weakness, seizure  History of Present Illness:  Jeffrey [name] is a 62 yo with PMH of ESRD on home HD since 2012, HTN, HIV (viral load undetectable 02/09/2018), and prior seizures not on AED's who presents for evaluation of generalized weakness and abnormal left arm movement in the day leading up to presentation. History limited 2/2 patient's altered mental status, as the patient had witnessed generalized tonic-clonic seizure in ED and was in post-ictal state at time of interview. His brother, who is primary caregiver, was briefly interviewed at bedside prior to leaving ED. His brother noted that the patient had been experiencing "ticks" in his left upper extremity prior to admission earlier in the day. His brother did not notice any other symptoms but stated that the patient has been under much stress recently because the patient just found out that he has a mass in his small intestine that may be cancerous. Per chart review the patient was to be referred to [facility] for further workup/treatment of this mass but this has not yet happened. Per chart review in the week leading up to admission the patient had been experiencing generalized weakness and has not been able to sit through home dialysis sessions as a result. Subsequently patient has not undergone dialysis for the past 5 days.  Upon arrival to the ED patient was afebrile, non-tachycardic, normotensive 130s/100 and saturating 100% on room air.  As stated above patient had witnessed seizure activity in ED and remained in post-ictal state throughout admission interview. During this time patient's BP elevated to 200s/100s. Patient had iSTAT VBG with pH 7.100, pCO2 40.8. ISTAT Chem 8 showed Na 131, potassium 4.4, bicarbonate of 15, BUN >140, and Cr >18. Calculated Anion Gap = 18.  Initial lactic acid = 7.17, with repeat 3.89. ISTAT troponin = 0.06. CBC with Hgb 9.4 (recent baseline 8-9) and WBC = 7.9. Patient had EKG which showed peaked T waves with sinus rhythm and a CXR that showed cardiomegaly and vascular congestion. Head CT showed chronic small vessel disease and parenchymal loss advanced for age, but no acute intracranial abnormalities. Patient was loaded with Keppra and IMTS was called for admission.  Meds:  Current Meds  Medication Sig  . albuterol (PROVENTIL) (2.5 MG/3ML) 0.083% nebulizer solution Take 2.5 mg by nebulization every 6 (six) hours as needed for wheezing or shortness of breath.  . ALPRAZolam (XANAX) 0.25 MG tablet Take 0.25 mg by mouth 3 (three) times daily as needed.   Marland Kitchen aspirin 81 MG chewable tablet Chew 1 tablet (81 mg total) by mouth daily.  . bictegravir-emtricitabine-tenofovir AF (BIKTARVY) 50-200-25 MG TABS tablet Take 1 tablet by mouth daily.  . calcium acetate (PHOSLO) 667 MG capsule Take 667-2,001 mg by mouth See admin instructions. 1,334-2,001 mg three times a day with each meal and 667-1,334 mg with each snack  .  carvedilol (COREG) 25 MG tablet Take 1 tablet (25 mg total) by mouth 2 (two) times daily with a meal.  . cloNIDine (CATAPRES - DOSED IN MG/24 HR) 0.1 mg/24hr patch UNWRAP AND APPLY 1 PATCH TO SKIN EVERY 7 DAYS  . clotrimazole-betamethasone (LOTRISONE) cream Apply 1 application topically 2 (two) times daily. (Patient taking differently: Apply 1 application topically 2 (two) times daily as needed (irritation). )  . doxazosin (CARDURA) 4 MG tablet Take 4 mg by mouth at bedtime.   Marland Kitchen epoetin alfa  (EPOGEN,PROCRIT) 2000 UNIT/ML injection Inject 2,000 Units into the vein 4 (four) times a week.   . ethyl chloride spray Apply 1 application topically daily as needed (HD).   . heparin 1000 UNIT/ML injection Inject 3,000 Units into the vein 4 (four) times a week.   . hydrALAZINE (APRESOLINE) 100 MG tablet Take 100 mg by mouth 3 (three) times daily.  . iron sucrose (VENOFER) 20 MG/ML injection Inject 100 mg into the vein once a week. On Monday  . multivitamin (RENA-VIT) TABS tablet Take 1 tablet by mouth daily.    . traZODone (DESYREL) 50 MG tablet TAKE 1 TABLET(50 MG) BY MOUTH AT BEDTIME AS NEEDED FOR SLEEP  . VOLTAREN 1 % GEL APPLY 2 GRAMS EXTERNALLY TO THE AFFECTED AREA FOUR TIMES DAILY (Patient taking differently: Apply 2 g topically 3 (three) times daily as needed (pain). )   Allergies: Allergies as of 02/21/2018 - Review Complete 02/21/2018  Allergen Reaction Noted  . Lisinopril Anaphylaxis and Shortness Of Breath 08/31/2012  . Penicillins Other (See Comments)    Past Medical History: Past Medical History:  Diagnosis Date  . Anemia   . Anxiety   . Arthritis   . Asthma    per pt hx  . End stage renal disease on home HD 07/10/2011   Started HD in September 2012 at [facility] with a tunneled HD catheter, now on home HD with NxtStage. Dialyzing through AVF L lower arm with buttonhole technique as of mid 2014. His brother does the HD treatments at home.  They are roommates for 23 years.  The brother works 3rd shift and gets off about 8am and then puts Jeffrey [name] on HD in the morning after getting home. Most of the time he does HD about 4 times a week, for about 4 hours per treatment. Cause of ESRD was HTN according to patient. He says he let his health go and ending up with complications, and that he didn't like seeing doctors in those days.  He says he was diagnosed with severe HTN when he lived in [place] in his 54's.   . Hepatitis B carrier ([place])   . HIV infection ([place])   . Hypertension   .  Hyperthyroidism    normal now  . Pneumonia several yrs ago  . Seizure ([place])   . Seizures ([place]) 06/02/2011   x 1 none since  . Thrombocytopenia ([place])    Family History:  Unable to be obtained 2/2 AMS  Social History:  Unable to be obtained 2/2 AMS  Review of Systems: A complete ROS was negative except as per HPI.  Physical Exam: Blood pressure 115/71, pulse 87, temperature 98.6 F (37 C), temperature source Oral, resp. rate (!) 21, SpO2 100 %.  Physical Exam  Constitutional:  Appears older than stated age laying in ED stretcher non diaphoretic and in no acute distress.  HENT:  Dry cracked lips  Eyes: No scleral icterus.  PERRL. Spontaneously moves eyes in all  directions, does not cooperate with formal testing.   Cardiovascular: Normal rate and regular rhythm.  Palpable thrill in L wrist over AVF. 2+ R radial, bilateral posterior tibial pulses.  Respiratory:  Breathing comfortably without [place] in place. No crackles or wheezing appreciated in anterior lung fields.  GI: Soft. Bowel sounds are normal. He exhibits no distension. There is no tenderness.  Musculoskeletal: He exhibits no edema (of bilateral lower extremities) or tenderness (of bilateral lower extremities).  Lymphadenopathy:    He has no cervical adenopathy.  Neurological:  Face symmetric. Does not cooperate with formal strength testing, but spontaneously moves all four extremities.  Skin: Skin is warm and dry. No rash noted. No erythema.   EKG: personally reviewed my interpretation is sinus rhythm with LVH and possible peaked T waves. ?ST elevation in lead V3, but no changes observed in other precordial leads to suggest ongoing ischemia.  CXR: personally reviewed my interpretation is mild vascular congestion without significant interstitial edema or acute opacities. Minor R pleural effusion, unable to assess for L pleural effusion given technical limitations of study.   Assessment & Plan by Problem: Principal  Problem:   Seizure ([place]) Active Problems:   HIV disease ([place])   End stage renal disease on home HD   Essential hypertension   Anemia of chronic kidney failure, unspecified stage   Mass of duodenum  Jeffrey [name] is a 62 yo with PMH of ESRD on home HD since 2012, HTN, HIV (viral load undetectable 02/09/2018), and prior seizures not on AED's who presented for evaluation of generalized weakness and had witnessed seizure activity in ED. The patient was admitted to the internal medicine teaching service for management with nephrology consulting. The specific problems addressed during admission are as follows:  Seizure activity: Patient had arm twitching leading up to admission where he was found to have generalized tonic-clonic seizure. Per chart review patient has history of seizure activity, which includes one seizure many years ago. Unclear etiology for this patient's seizure at this time, but head CT in ED did not show acute process. Patient with ?duodenal mass that may represent adenocarcinoma on recent endoscopy. In this setting will further evaluate for possible metastatic disease and/or other structural variation that could predispose to seizure activity with brain MRI in AM. Initial iSTAT labs showed anion-gap metabolic acidosis (AG = 18) 2/2 elevated lactate on admission, but no other significant electrolyte abnormalities to explain new onset seizure. Patient loaded with Keppra in the ED and has remained post ictal since. Patient appears to be protecting his airway for now, as he easily arouses to voice on exam but still does not follow commands. Will closely monitor clinical status and continue AEDs initiated in ED. Patient will likely need AED therapy moving forward, will consult neurology in AM for assistance with this therapy as patient currently on HIV medications and do not know which medications will  -Admit to stepdown -Neuro checks, seizure precautions, NPO -Neurology consult in AM -Brain  MRI in AM -Continue Keppra, IV 500 mg BID -CMP in AM, trend lactic acid  ESRD on home HD: Nephrology consulted in ED as patient has reportedly not had dialysis for about 5 days 2/2 generalized weakness. Patient with ?peaked T waves on EKG, but iSTAT labs show K+ of 4.4. Patient is not significantly volume overloaded on exam, currently stable on room air. Given above, no emergent need for dialysis on admission. Nephrology will continue to follow for dialysis needs while inpatient.  -Nephrology consulted, recommendations appreciated  Anemia with recently identified duodenal mass, likely adenocarcinoma: Patient admitted with symptomatic anemia in January of this year. At that time he was found to have iron deficiency in addition to anemia associated with chronic kidney disease. As part of patient's workup for Fe deficient anemia, patient underwent endoscopy and colonoscopy where a duodenal mass was identified. Per path report from 02/17/2018, high grade dysplasia was observed and high suspicion of adenocarcinoma (just not sampled on biopsy) without invasion into muscularis propria or pancreas on ultrasound. GI doctor to facilitate referral to tertiary center for further management. -Continue home EPO, Iron supplementation with HD -Clinically monitor for signs/symptoms of upper GI bleed -Facilitate referral to [facility], Dr Zenia Resides for further management  Hypertension: Patient initially normotensive, but intermittently hypertensive in ED after witnessed seizure activity. Home regimen includes hydralazine 100 mg TID, carvedilol 25 mg BID, and doxazosin 0.4 mg qHS, and clonidine 0.1mg /24 hour patch (changed qFriday at home). Will continue to monitor and provide home hydralazine while patient  -Continue home clonidine patch -Add back home hydralazine, carvedilol, doxazosin once tolerating PO  HIV, undetectable viral load on 02/09/2018: Patient on [place] daily for treatment. Will need to ensure that any AED's  started in hospital and continued moving forward are compatible with this regimen -Resume home regimen once MS improves, tolerating PO intake  FEN/GI: -NPO pending improvement in MS -No IVF, replace electrolytes as needed  VTE Prophylaxis: Heparin TID Code Status: Full  Dispo: Admit patient to Inpatient with expected length of stay greater than 2 midnights.  SignedThomasene Ripple, MD 02/22/2018, 2:05 AM  Pager: [phone]

## 2018-02-21 NOTE — Telephone Encounter (Signed)
Pt caregiver left vm for Jeffrey [name] and Dr. Vicente Males he states he liked to set up [facility] next Surgery as soon as possible states that pt is getting sicker and we need to schedule the next step

## 2018-02-21 NOTE — ED Notes (Signed)
Pt had grand-mal seizure lasting approx 1 1/2 minutes; airway suctioned; MD in to assess; being bagged via BVM at 100% o2 as pt's resp approx 6/min post seizure; nasal trumpet inserted into left nare per secondary RN; resp then increased to 16/min; placed on 15 L NRB mask; RT at bedside

## 2018-02-21 NOTE — Telephone Encounter (Signed)
Pt caregiver calling  Again he would greatly appreciate to have this done As soon as possible he states pt would prefer surgeon  at [place] rather then [place] if possible

## 2018-02-21 NOTE — ED Notes (Signed)
Pt hypertensive, provider made aware. Pt moving extremities well. Pt beginning to wake up more. Airway clear and patent. Pt on room air , o2 sats 97%.

## 2018-02-21 NOTE — ED Triage Notes (Signed)
Per ems, generalized weakness - pt is hemodialysis pt - has not been ran since Thursday - had recent endo for unk reason per EMS - also constipation; does not make urine; peaked T waves on cm

## 2018-02-21 NOTE — Telephone Encounter (Signed)
The pt's brother was notified to call Dr Georgeann Oppenheim office the pt's GI doc.  Dr Ardis Hughs only did the EUS for the pt and he was sent back to Dr Vicente Males.  The brother verbalized understanding

## 2018-02-21 NOTE — ED Notes (Addendum)
VO from [employer] PA-C to give 2mg  of ativan IM, patient on cardiac monitor. Currently Postictal

## 2018-02-21 NOTE — ED Notes (Signed)
Patient transported to CT 

## 2018-02-22 ENCOUNTER — Other Ambulatory Visit: Payer: Self-pay | Admitting: Internal Medicine

## 2018-02-22 ENCOUNTER — Inpatient Hospital Stay (HOSPITAL_COMMUNITY): Payer: Medicare Other

## 2018-02-22 DIAGNOSIS — Z9114 Patient's other noncompliance with medication regimen: Secondary | ICD-10-CM | POA: Diagnosis not present

## 2018-02-22 DIAGNOSIS — D631 Anemia in chronic kidney disease: Secondary | ICD-10-CM | POA: Diagnosis present

## 2018-02-22 DIAGNOSIS — B181 Chronic viral hepatitis B without delta-agent: Secondary | ICD-10-CM

## 2018-02-22 DIAGNOSIS — Z888 Allergy status to other drugs, medicaments and biological substances status: Secondary | ICD-10-CM | POA: Diagnosis not present

## 2018-02-22 DIAGNOSIS — R402312 Coma scale, best motor response, none, at arrival to emergency department: Secondary | ICD-10-CM | POA: Diagnosis present

## 2018-02-22 DIAGNOSIS — F419 Anxiety disorder, unspecified: Secondary | ICD-10-CM | POA: Diagnosis present

## 2018-02-22 DIAGNOSIS — R4182 Altered mental status, unspecified: Secondary | ICD-10-CM | POA: Diagnosis not present

## 2018-02-22 DIAGNOSIS — E872 Acidosis, unspecified: Secondary | ICD-10-CM

## 2018-02-22 DIAGNOSIS — Z781 Physical restraint status: Secondary | ICD-10-CM | POA: Diagnosis not present

## 2018-02-22 DIAGNOSIS — E440 Moderate protein-calorie malnutrition: Secondary | ICD-10-CM | POA: Diagnosis present

## 2018-02-22 DIAGNOSIS — D696 Thrombocytopenia, unspecified: Secondary | ICD-10-CM | POA: Diagnosis not present

## 2018-02-22 DIAGNOSIS — R0902 Hypoxemia: Secondary | ICD-10-CM | POA: Diagnosis not present

## 2018-02-22 DIAGNOSIS — R402112 Coma scale, eyes open, never, at arrival to emergency department: Secondary | ICD-10-CM | POA: Diagnosis present

## 2018-02-22 DIAGNOSIS — Z992 Dependence on renal dialysis: Secondary | ICD-10-CM | POA: Diagnosis not present

## 2018-02-22 DIAGNOSIS — I120 Hypertensive chronic kidney disease with stage 5 chronic kidney disease or end stage renal disease: Secondary | ICD-10-CM | POA: Diagnosis not present

## 2018-02-22 DIAGNOSIS — C170 Malignant neoplasm of duodenum: Secondary | ICD-10-CM | POA: Diagnosis not present

## 2018-02-22 DIAGNOSIS — Z880 Allergy status to penicillin: Secondary | ICD-10-CM

## 2018-02-22 DIAGNOSIS — Y841 Kidney dialysis as the cause of abnormal reaction of the patient, or of later complication, without mention of misadventure at the time of the procedure: Secondary | ICD-10-CM | POA: Diagnosis not present

## 2018-02-22 DIAGNOSIS — Z21 Asymptomatic human immunodeficiency virus [HIV] infection status: Secondary | ICD-10-CM | POA: Diagnosis present

## 2018-02-22 DIAGNOSIS — B20 Human immunodeficiency virus [HIV] disease: Secondary | ICD-10-CM

## 2018-02-22 DIAGNOSIS — Z79899 Other long term (current) drug therapy: Secondary | ICD-10-CM | POA: Diagnosis not present

## 2018-02-22 DIAGNOSIS — R569 Unspecified convulsions: Secondary | ICD-10-CM

## 2018-02-22 DIAGNOSIS — Z452 Encounter for adjustment and management of vascular access device: Secondary | ICD-10-CM | POA: Diagnosis not present

## 2018-02-22 DIAGNOSIS — R011 Cardiac murmur, unspecified: Secondary | ICD-10-CM | POA: Diagnosis not present

## 2018-02-22 DIAGNOSIS — E8770 Fluid overload, unspecified: Secondary | ICD-10-CM | POA: Diagnosis present

## 2018-02-22 DIAGNOSIS — N19 Unspecified kidney failure: Secondary | ICD-10-CM

## 2018-02-22 DIAGNOSIS — R402212 Coma scale, best verbal response, none, at arrival to emergency department: Secondary | ICD-10-CM | POA: Diagnosis present

## 2018-02-22 DIAGNOSIS — E1122 Type 2 diabetes mellitus with diabetic chronic kidney disease: Secondary | ICD-10-CM | POA: Diagnosis present

## 2018-02-22 DIAGNOSIS — J90 Pleural effusion, not elsewhere classified: Secondary | ICD-10-CM | POA: Diagnosis not present

## 2018-02-22 DIAGNOSIS — D372 Neoplasm of uncertain behavior of small intestine: Secondary | ICD-10-CM | POA: Diagnosis present

## 2018-02-22 DIAGNOSIS — K3189 Other diseases of stomach and duodenum: Secondary | ICD-10-CM

## 2018-02-22 DIAGNOSIS — N186 End stage renal disease: Secondary | ICD-10-CM | POA: Diagnosis not present

## 2018-02-22 DIAGNOSIS — G40409 Other generalized epilepsy and epileptic syndromes, not intractable, without status epilepticus: Secondary | ICD-10-CM | POA: Diagnosis not present

## 2018-02-22 DIAGNOSIS — D649 Anemia, unspecified: Secondary | ICD-10-CM | POA: Diagnosis not present

## 2018-02-22 DIAGNOSIS — N2581 Secondary hyperparathyroidism of renal origin: Secondary | ICD-10-CM | POA: Diagnosis present

## 2018-02-22 DIAGNOSIS — T82838A Hemorrhage of vascular prosthetic devices, implants and grafts, initial encounter: Secondary | ICD-10-CM | POA: Diagnosis not present

## 2018-02-22 DIAGNOSIS — Z9115 Patient's noncompliance with renal dialysis: Secondary | ICD-10-CM | POA: Diagnosis not present

## 2018-02-22 DIAGNOSIS — G9349 Other encephalopathy: Secondary | ICD-10-CM | POA: Diagnosis present

## 2018-02-22 DIAGNOSIS — I1311 Hypertensive heart and chronic kidney disease without heart failure, with stage 5 chronic kidney disease, or end stage renal disease: Secondary | ICD-10-CM | POA: Diagnosis present

## 2018-02-22 DIAGNOSIS — R531 Weakness: Secondary | ICD-10-CM | POA: Diagnosis not present

## 2018-02-22 DIAGNOSIS — Z6823 Body mass index (BMI) 23.0-23.9, adult: Secondary | ICD-10-CM | POA: Diagnosis not present

## 2018-02-22 LAB — CBC WITH DIFFERENTIAL/PLATELET
BASOS ABS: 0 10*3/uL (ref 0.0–0.1)
Basophils Relative: 0 %
EOS PCT: 0 %
Eosinophils Absolute: 0 10*3/uL (ref 0.0–0.7)
HEMATOCRIT: 28.9 % — AB (ref 39.0–52.0)
Hemoglobin: 9.5 g/dL — ABNORMAL LOW (ref 13.0–17.0)
LYMPHS ABS: 0.6 10*3/uL — AB (ref 0.7–4.0)
Lymphocytes Relative: 7 %
MCH: 31.6 pg (ref 26.0–34.0)
MCHC: 32.9 g/dL (ref 30.0–36.0)
MCV: 96 fL (ref 78.0–100.0)
MONOS PCT: 8 %
Monocytes Absolute: 0.7 10*3/uL (ref 0.1–1.0)
NEUTROS PCT: 85 %
Neutro Abs: 7.7 10*3/uL (ref 1.7–7.7)
PLATELETS: 111 10*3/uL — AB (ref 150–400)
RBC: 3.01 MIL/uL — AB (ref 4.22–5.81)
RDW: 24.9 % — AB (ref 11.5–15.5)
WBC: 9 10*3/uL (ref 4.0–10.5)

## 2018-02-22 LAB — GLUCOSE, CAPILLARY
GLUCOSE-CAPILLARY: 81 mg/dL (ref 65–99)
GLUCOSE-CAPILLARY: 83 mg/dL (ref 65–99)

## 2018-02-22 LAB — CBC
HCT: 25.3 % — ABNORMAL LOW (ref 39.0–52.0)
HEMOGLOBIN: 8.4 g/dL — AB (ref 13.0–17.0)
MCH: 31.5 pg (ref 26.0–34.0)
MCHC: 33.2 g/dL (ref 30.0–36.0)
MCV: 94.8 fL (ref 78.0–100.0)
Platelets: 95 10*3/uL — ABNORMAL LOW (ref 150–400)
RBC: 2.67 MIL/uL — ABNORMAL LOW (ref 4.22–5.81)
RDW: 25.2 % — ABNORMAL HIGH (ref 11.5–15.5)
WBC: 7.5 10*3/uL (ref 4.0–10.5)

## 2018-02-22 LAB — COMPREHENSIVE METABOLIC PANEL
ALT: 20 U/L (ref 17–63)
ANION GAP: 25 — AB (ref 5–15)
AST: 42 U/L — ABNORMAL HIGH (ref 15–41)
Albumin: 3.5 g/dL (ref 3.5–5.0)
Alkaline Phosphatase: 77 U/L (ref 38–126)
BUN: 178 mg/dL — ABNORMAL HIGH (ref 6–20)
CHLORIDE: 94 mmol/L — AB (ref 101–111)
CO2: 13 mmol/L — AB (ref 22–32)
Calcium: 7.7 mg/dL — ABNORMAL LOW (ref 8.9–10.3)
Creatinine, Ser: 21.55 mg/dL — ABNORMAL HIGH (ref 0.61–1.24)
GFR, EST AFRICAN AMERICAN: 2 mL/min — AB (ref >60)
GFR, EST NON AFRICAN AMERICAN: 2 mL/min — AB (ref >60)
Glucose, Bld: 131 mg/dL — ABNORMAL HIGH (ref 65–99)
Potassium: 5.3 mmol/L — ABNORMAL HIGH (ref 3.5–5.1)
SODIUM: 132 mmol/L — AB (ref 135–145)
Total Bilirubin: 1 mg/dL (ref 0.3–1.2)
Total Protein: 6.8 g/dL (ref 6.5–8.1)

## 2018-02-22 LAB — PROTIME-INR
INR: 1.41
Prothrombin Time: 17.1 seconds — ABNORMAL HIGH (ref 11.4–15.2)

## 2018-02-22 LAB — APTT: APTT: 41 s — AB (ref 24–36)

## 2018-02-22 LAB — LACTIC ACID, PLASMA: LACTIC ACID, VENOUS: 1.8 mmol/L (ref 0.5–1.9)

## 2018-02-22 MED ORDER — HEPARIN SODIUM (PORCINE) 1000 UNIT/ML DIALYSIS
1000.0000 [IU] | INTRAMUSCULAR | Status: DC | PRN
  Filled 2018-02-22: qty 1

## 2018-02-22 MED ORDER — "THROMBI-PAD 3""X3"" EX PADS"
1.0000 | MEDICATED_PAD | Freq: Once | CUTANEOUS | Status: AC
  Administered 2018-02-22: 1 via TOPICAL
  Filled 2018-02-22: qty 1

## 2018-02-22 MED ORDER — ACETAMINOPHEN 650 MG RE SUPP
650.0000 mg | RECTAL | Status: DC | PRN
  Administered 2018-02-24: 650 mg via RECTAL
  Filled 2018-02-22: qty 1

## 2018-02-22 MED ORDER — SODIUM CHLORIDE 0.9 % IV SOLN: 100.0000 mL | INTRAVENOUS | Status: DC | PRN

## 2018-02-22 MED ORDER — CHLORHEXIDINE GLUCONATE CLOTH 2 % EX PADS
6.0000 | MEDICATED_PAD | Freq: Every day | CUTANEOUS | Status: DC
  Administered 2018-02-23: 6 via TOPICAL

## 2018-02-22 MED ORDER — HYDRALAZINE HCL 20 MG/ML IJ SOLN
20.0000 mg | Freq: Three times a day (TID) | INTRAMUSCULAR | Status: DC | PRN
  Administered 2018-02-22 – 2018-02-23 (×3): 20 mg via INTRAVENOUS
  Filled 2018-02-22 (×3): qty 1

## 2018-02-22 MED ORDER — PENTAFLUOROPROP-TETRAFLUOROETH EX AERO
1.0000 "application " | INHALATION_SPRAY | CUTANEOUS | Status: DC | PRN
  Filled 2018-02-22: qty 103.5

## 2018-02-22 MED ORDER — LEVETIRACETAM IN NACL 500 MG/100ML IV SOLN
500.0000 mg | Freq: Two times a day (BID) | INTRAVENOUS | Status: DC
  Administered 2018-02-22 – 2018-02-24 (×5): 500 mg via INTRAVENOUS
  Filled 2018-02-22 (×5): qty 100

## 2018-02-22 MED ORDER — HEPARIN SODIUM (PORCINE) 5000 UNIT/ML IJ SOLN
5000.0000 [IU] | Freq: Three times a day (TID) | INTRAMUSCULAR | Status: DC
  Administered 2018-02-22 – 2018-02-24 (×6): 5000 [IU] via SUBCUTANEOUS
  Filled 2018-02-22 (×6): qty 1

## 2018-02-22 MED ORDER — ACETAMINOPHEN 325 MG PO TABS
650.0000 mg | ORAL_TABLET | ORAL | Status: DC | PRN
  Administered 2018-02-23 – 2018-02-26 (×2): 650 mg via ORAL
  Filled 2018-02-22 (×2): qty 2

## 2018-02-22 MED ORDER — CLONIDINE HCL 0.1 MG/24HR TD PTWK
0.1000 mg | MEDICATED_PATCH | TRANSDERMAL | Status: DC
  Administered 2018-02-25: 0.1 mg via TRANSDERMAL
  Filled 2018-02-22: qty 1

## 2018-02-22 MED ORDER — LIDOCAINE HCL (PF) 1 % IJ SOLN: 5.0000 mL | INTRAMUSCULAR | Status: DC | PRN

## 2018-02-22 MED ORDER — BICTEGRAVIR-EMTRICITAB-TENOFOV 50-200-25 MG PO TABS
1.0000 | ORAL_TABLET | Freq: Every day | ORAL | Status: DC
  Administered 2018-02-23 – 2018-02-28 (×6): 1 via ORAL
  Filled 2018-02-22 (×7): qty 1

## 2018-02-22 MED ORDER — LIDOCAINE-PRILOCAINE 2.5-2.5 % EX CREA
1.0000 "application " | TOPICAL_CREAM | CUTANEOUS | Status: DC | PRN
  Filled 2018-02-22: qty 5

## 2018-02-22 MED ORDER — ALTEPLASE 2 MG IJ SOLR
2.0000 mg | Freq: Once | INTRAMUSCULAR | Status: DC | PRN
  Filled 2018-02-22: qty 2

## 2018-02-22 MED ORDER — CHLORHEXIDINE GLUCONATE CLOTH 2 % EX PADS
6.0000 | MEDICATED_PAD | Freq: Every day | CUTANEOUS | Status: DC
  Administered 2018-02-24: 6 via TOPICAL

## 2018-02-22 NOTE — ED Notes (Signed)
Temporary dialysis port being placed by provider in room. Brother gave consent, pt unable to.

## 2018-02-22 NOTE — Progress Notes (Signed)
EEG completed in the ED full report to follow.

## 2018-02-22 NOTE — ED Notes (Signed)
Dr. Juleen China, Mitzi Hansen paged and made aware of patient's BP.

## 2018-02-22 NOTE — ED Notes (Signed)
Dressing to rgt lateral neck partially saturated with bright red blood - admitting team paged to request additional suture be placed

## 2018-02-22 NOTE — ED Notes (Signed)
Dressing to rgt lateral neck where hemodialysis catheter previously placed noted to again be oozing bright red blood from beneath dressing; all dressing removed; area cleansed - surgicel applied over catheter insertion site then dressing applied - moderate amt of pressure applied x10 min - bleeding controlled at this time

## 2018-02-22 NOTE — ED Notes (Signed)
Transported to dialysis via stretcher per EDT

## 2018-02-22 NOTE — Consult Note (Signed)
Reason for Consult: ESRD Referring Physician:  Dr. Beryle Beams  Chief Complaint: Weakness  Assessment/Plan: 1. ESRD - Even though the last treatment was on Thursday his metabolic panel suggests he has not been consistent with dialysis or has not been receiving good dialysis/clearances. He's still postictal and has some contractures which will make dialysis through the fistula very tenuous. I have asked the critical care team to place a temporary catheter. Will plan on HD once the temporary catheter is placed or through the fistula if the pt's mental status improves. 2. Seizures - h/o seizures in the past but last time was many years ago. Started on [place] in the ED and given Ativan 7m IV. Still post ictal. 3. HTN - continue patch and then PO as tolerated. He is overloaded and UF will assist with control. 4. HIV  5. Anemia - will check iron panels + ESA.    HPI: Jeffrey NDIAYEis an 62y.o. male HIV, HTN, ESRD on home dialysis followed by Dr. LHolley Raringwho usually dialyzes 4x/week with last HD prior Thursday. Brother (who is bedside) is the person who cannulates the patient but did not dialyze after Thursday bec the pt who is a hypochondriac per brother was very upset finding out recently that colonoscopy revealed a mass in the small intestine. Pt brought to the ED bec of generalized weakness and abnormal left arm movement for the past day and was then noted to have a grand mal seizure last 1-1/2 minutes in the ED treated with Ativan 2 mg. His blood work revealed a BUN/CR of 178/21.55 this AM and a venous pH of 7.1 with a HCO3 of 13. All history is through the brother who is bedside. He states that the appetite has not been great the past 2 weeks but denies any f/c/n/v/diarrhea/CP/abd pain/cough/sick contacts. He states that the pt is a hypochondriac and very upset with the diagnosis of a mass in the GI tract (small bowel) discovered during a endoscopy last Thursday [facility] The brother had a tough time even convincing the pt to receive dialysis on Thursday.  ROS Review of systems not obtained due to patient factors.  Chemistry and CBC: Creatinine  Date/Time Value Ref Range Status  11/18/2016 10:36 AM 6.6 (HH) 0.7 - 1.3 mg/dL Final   Creat  Date/Time Value Ref Range Status  11/01/2017 10:08 AM 10.64 (H) 0.70 - 1.25 mg/dL Final    Comment:    Verified by repeat analysis. . For patients >461years of age, the reference limit for Creatinine is approximately 13% higher for people identified as African-American. .Marland Kitchen  02/10/2017 04:19 PM 13.81 (H) 0.70 - 1.25 mg/dL Final    Comment:    Result repeated and verified.   For patients > or = 64years of age: The upper reference limit for Creatinine is approximately 13% higher for people identified as African-American.     06/10/2016 11:58 AM 14.64 (H) 0.70 - 1.25 mg/dL Final    Comment:    Result repeated and verified.   For patients > or = 64years of age: The upper reference limit for Creatinine is approximately 13% higher for people identified as African-American.     04/07/2016 02:35 PM 14.21 (H) 0.70 - 1.25 mg/dL Final    Comment:    Result repeated and verified.   For patients > or = 64years of age: The upper reference limit for Creatinine is approximately 13% higher  for people identified as African-American.     10/14/2015 10:12 AM 16.52 (H) 0.70 - 1.25 mg/dL Final    Comment:    Result repeated and verified.  04/30/2015 11:10 AM 14.00 (H) 0.70 - 1.25 mg/dL Final    Comment:    Result repeated and verified.  10/23/2014 10:13 AM 13.66 (HH) 0.50 - 1.35 mg/dL Final    Comment:    Result repeated and verified. Result confirmed by automatic dilution.   10/24/2013 09:09 AM 9.80 (H) 0.50 - 1.35 mg/dL Final    Comment:    Result repeated and verified. Result confirmed by automatic dilution.  04/24/2013 10:28 AM 13.19 (HH) 0.50 - 1.35 mg/dL Final    Comment:     Result repeated and verified.  10/12/2012 10:26 AM 11.51 (HH) 0.50 - 1.35 mg/dL Final    Comment:    Result repeated and verified.  05/31/2012 09:44 AM 13.81 (HH) 0.50 - 1.35 mg/dL Final    Comment:    Result repeated and verified.  11/25/2011 02:44 PM 7.71 (H) 0.50 - 1.35 mg/dL Final    Comment:    Result repeated and verified.   Creatinine, Ser  Date/Time Value Ref Range Status  02/22/2018 01:34 AM 21.55 (H) 0.61 - 1.24 mg/dL Final  02/21/2018 07:47 PM >18.00 (H) 0.61 - 1.24 mg/dL Final  12/26/2017 03:20 PM 11.62 (H) 0.61 - 1.24 mg/dL Final  10/13/2017 04:17 AM 10.29 (H) 0.61 - 1.24 mg/dL Final  10/12/2017 02:30 PM 9.30 (H) 0.61 - 1.24 mg/dL Final  10/07/2017 09:11 PM 9.76 (H) 0.61 - 1.24 mg/dL Final  09/25/2017 05:08 AM 8.58 (H) 0.61 - 1.24 mg/dL Final  09/24/2017 05:46 AM 12.38 (H) 0.61 - 1.24 mg/dL Final  09/23/2017 06:00 AM 9.78 (H) 0.61 - 1.24 mg/dL Final  09/17/2017 05:44 AM 17.02 (H) 0.61 - 1.24 mg/dL Final  09/16/2017 05:14 AM 15.46 (H) 0.61 - 1.24 mg/dL Final  09/15/2017 01:30 PM 14.21 (H) 0.61 - 1.24 mg/dL Final  09/12/2016 06:14 AM 4.27 (H) 0.61 - 1.24 mg/dL Final  09/11/2016 06:18 AM 5.10 (H) 0.61 - 1.24 mg/dL Final  09/10/2016 03:51 PM 3.60 (H) 0.61 - 1.24 mg/dL Final    Comment:    DIALYSIS  09/10/2016 06:45 AM 6.09 (H) 0.61 - 1.24 mg/dL Final    Comment:    RESULT REPEATED AND VERIFIED DELTA CHECK NOTED   09/09/2016 01:55 AM 9.46 (H) 0.61 - 1.24 mg/dL Final  08/08/2016 07:50 AM 8.95 (H) 0.61 - 1.24 mg/dL Final  08/06/2016 07:26 AM 10.37 (H) 0.61 - 1.24 mg/dL Final    Comment:    DELTA CHECK NOTED  08/04/2016 02:25 PM 15.56 (H) 0.61 - 1.24 mg/dL Final  08/03/2016 09:11 PM 13.65 (H) 0.61 - 1.24 mg/dL Final  07/29/2016 09:09 AM 14.47 (H) 0.61 - 1.24 mg/dL Final  04/10/2016 01:37 AM 9.89 (H) 0.61 - 1.24 mg/dL Final    Comment:    DELTA CHECK NOTED  04/09/2016 04:20 AM 18.61 (H) 0.61 - 1.24 mg/dL Final  04/08/2016 12:46 PM 17.44 (H) 0.61 - 1.24 mg/dL  Final  06/07/2015 06:50 PM 8.67 (H) 0.61 - 1.24 mg/dL Final  07/31/2014 10:02 PM 9.52 (H) 0.50 - 1.35 mg/dL Final  06/16/2013 11:50 PM 5.98 (H) 0.50 - 1.35 mg/dL Final  06/13/2013 03:50 AM 6.69 (H) 0.50 - 1.35 mg/dL Final    Comment:    DELTA CHECK NOTED  06/12/2013 10:25 AM 10.67 (H) 0.50 - 1.35 mg/dL Final  06/12/2013 03:11 AM 9.80 (H) 0.50 - 1.35 mg/dL  Final  01/10/2013 07:06 AM 11.69 (H) 0.50 - 1.35 mg/dL Final  01/09/2013 10:15 AM 17.04 (H) 0.50 - 1.35 mg/dL Final  01/08/2013 10:15 PM 16.69 (H) 0.50 - 1.35 mg/dL Final  12/20/2012 10:01 AM 26.97 (H) 0.50 - 1.35 mg/dL Final  09/19/2012 03:00 PM 8.00 (H) 0.50 - 1.35 mg/dL Final  09/17/2012 11:08 AM 16.35 (H) 0.50 - 1.35 mg/dL Final  01/29/2012 07:17 PM 8.70 (H) 0.50 - 1.35 mg/dL Final  10/29/2011 01:08 PM 11.40 (H) 0.50 - 1.35 mg/dL Final  08/29/2011 09:28 AM 13.67 (H) 0.50 - 1.35 mg/dL Final  08/28/2011 09:40 AM 10.77 (H) 0.50 - 1.35 mg/dL Final  08/27/2011 08:10 PM 8.91 (H) 0.50 - 1.35 mg/dL Final  06/16/2011 06:15 AM 12.24 (H) 0.50 - 1.35 mg/dL Final  06/13/2011 05:35 AM 9.81 (H) 0.50 - 1.35 mg/dL Final  06/12/2011 06:27 AM 7.03 (H) 0.50 - 1.35 mg/dL Final    Comment:    DELTA CHECK NOTED  06/11/2011 02:01 PM 11.75 (H) 0.50 - 1.35 mg/dL Final  06/10/2011 05:40 AM 9.16 (H) 0.50 - 1.35 mg/dL Final  06/09/2011 05:35 AM 12.96 (H) 0.50 - 1.35 mg/dL Final  06/08/2011 06:22 AM 11.68 (H) 0.50 - 1.35 mg/dL Final  06/07/2011 06:19 AM 8.56 (H) 0.50 - 1.35 mg/dL Final    Comment:    DELTA CHECK NOTED  06/06/2011 07:20 AM 13.34 (H) 0.50 - 1.35 mg/dL Final  06/05/2011 05:16 AM 11.78 (H) 0.50 - 1.35 mg/dL Final   Recent Labs  Lab 02/21/18 1947 02/22/18 0134  NA 131* 132*  K 4.4 5.3*  CL 98* 94*  CO2  --  13*  GLUCOSE 115* 131*  BUN >140* 178*  CREATININE >18.00* 21.55*  CALCIUM  --  7.7*   Recent Labs  Lab 02/21/18 1917 02/21/18 1947  WBC 7.9  --   NEUTROABS 4.4  --   HGB 9.4* 10.5*  HCT 29.1* 31.0*  MCV 99.0  --   PLT  152  --    Liver Function Tests: Recent Labs  Lab 02/22/18 0134  AST 42*  ALT 20  ALKPHOS 77  BILITOT 1.0  PROT 6.8  ALBUMIN 3.5   No results for input(s): LIPASE, AMYLASE in the last 168 hours. No results for input(s): AMMONIA in the last 168 hours. Cardiac Enzymes: No results for input(s): CKTOTAL, CKMB, CKMBINDEX, TROPONINI in the last 168 hours. Iron Studies: No results for input(s): IRON, TIBC, TRANSFERRIN, FERRITIN in the last 72 hours. PT/INR: _0 (inr:5)  Xrays/Other Studies: ) Results for orders placed or performed during the hospital encounter of 02/21/18 (from the past 48 hour(s))  CBC with Differential     Status: Abnormal   Collection Time: 02/21/18  7:17 PM  Result Value Ref Range   WBC 7.9 4.0 - 10.5 K/uL   RBC 2.94 (L) 4.22 - 5.81 MIL/uL   Hemoglobin 9.4 (L) 13.0 - 17.0 g/dL   HCT 29.1 (L) 39.0 - 52.0 %   MCV 99.0 78.0 - 100.0 fL   MCH 32.0 26.0 - 34.0 pg   MCHC 32.3 30.0 - 36.0 g/dL   RDW 25.2 (H) 11.5 - 15.5 %   Platelets 152 150 - 400 K/uL   Neutrophils Relative % 56 %   Lymphocytes Relative 33 %   Monocytes Relative 7 %   Eosinophils Relative 3 %   Basophils Relative 1 %   Neutro Abs 4.4 1.7 - 7.7 K/uL   Lymphs Abs 2.6 0.7 - 4.0 K/uL   Monocytes Absolute 0.6 0.1 -  1.0 K/uL   Eosinophils Absolute 0.2 0.0 - 0.7 K/uL   Basophils Absolute 0.1 0.0 - 0.1 K/uL   RBC Morphology BURR CELLS     Comment: ELLIPTOCYTES Schistocytes present Performed at [facility] [address] [place], [place] [address]   I-stat troponin, ED     Status: None   Collection Time: 02/21/18  7:45 PM  Result Value Ref Range   Troponin i, poc 0.06 0.00 - 0.08 ng/mL   Comment 3            Comment: Due to the release kinetics of cTnI, a negative result within the first hours of the onset of symptoms does not rule out myocardial infarction with certainty. If myocardial infarction is still suspected, repeat the test at appropriate intervals.   I-Stat venous  blood gas, ED     Status: Abnormal   Collection Time: 02/21/18  7:46 PM  Result Value Ref Range   pH, Ven 7.100 (LL) 7.250 - 7.430   pCO2, Ven 40.8 (L) 44.0 - 60.0 mmHg   pO2, Ven 93.0 (H) 32.0 - 45.0 mmHg   Bicarbonate 12.7 (L) 20.0 - 28.0 mmol/L   TCO2 14 (L) 22 - 32 mmol/L   O2 Saturation 94.0 %   Acid-base deficit 16.0 (H) 0.0 - 2.0 mmol/L   Patient temperature HIDE    Sample type VENOUS    Comment NOTIFIED PHYSICIAN   I-Stat CG4 Lactic Acid, ED     Status: Abnormal   Collection Time: 02/21/18  7:47 PM  Result Value Ref Range   Lactic Acid, Venous 7.17 (HH) 0.5 - 1.9 mmol/L   Comment NOTIFIED PHYSICIAN   I-stat Chem 8, ED     Status: Abnormal   Collection Time: 02/21/18  7:47 PM  Result Value Ref Range   Sodium 131 (L) 135 - 145 mmol/L   Potassium 4.4 3.5 - 5.1 mmol/L   Chloride 98 (L) 101 - 111 mmol/L   BUN >140 (H) 6 - 20 mg/dL   Creatinine, Ser >18.00 (H) 0.61 - 1.24 mg/dL   Glucose, Bld 115 (H) 65 - 99 mg/dL   Calcium, Ion 0.93 (L) 1.15 - 1.40 mmol/L   TCO2 15 (L) 22 - 32 mmol/L   Hemoglobin 10.5 (L) 13.0 - 17.0 g/dL   HCT 31.0 (L) 39.0 - 52.0 %  I-Stat CG4 Lactic Acid, ED     Status: Abnormal   Collection Time: 02/21/18  9:23 PM  Result Value Ref Range   Lactic Acid, Venous 3.89 (HH) 0.5 - 1.9 mmol/L   Comment NOTIFIED PHYSICIAN   Comprehensive metabolic panel     Status: Abnormal   Collection Time: 02/22/18  1:34 AM  Result Value Ref Range   Sodium 132 (L) 135 - 145 mmol/L   Potassium 5.3 (H) 3.5 - 5.1 mmol/L    Comment: SLIGHT HEMOLYSIS DELTA CHECK NOTED    Chloride 94 (L) 101 - 111 mmol/L   CO2 13 (L) 22 - 32 mmol/L   Glucose, Bld 131 (H) 65 - 99 mg/dL   BUN 178 (H) 6 - 20 mg/dL   Creatinine, Ser 21.55 (H) 0.61 - 1.24 mg/dL   Calcium 7.7 (L) 8.9 - 10.3 mg/dL   Total Protein 6.8 6.5 - 8.1 g/dL   Albumin 3.5 3.5 - 5.0 g/dL   AST 42 (H) 15 - 41 U/L   ALT 20 17 - 63 U/L   Alkaline Phosphatase 77 38 - 126 U/L   Total Bilirubin 1.0 0.3 -  1.2 mg/dL   GFR  calc non Af Amer 2 (L) >60 mL/min   GFR calc Af Amer 2 (L) >60 mL/min    Comment: (NOTE) The eGFR has been calculated using the CKD EPI equation. This calculation has not been validated in all clinical situations. eGFR's persistently <60 mL/min signify possible Chronic Kidney Disease.    Anion gap 25 (H) 5 - 15    Comment: RESULT CHECKED Performed at [facility], [place] [address], [place], [place] [address]   Lactic acid, plasma     Status: None   Collection Time: 02/22/18  1:34 AM  Result Value Ref Range   Lactic Acid, Venous 1.8 0.5 - 1.9 mmol/L    Comment: Performed at [facility] [address], [place], [place] [address]   Ct Head Wo Contrast  Result Date: 02/22/2018 CLINICAL DATA:  Generalized weakness, grand mal seizure. History of end stage renal disease on dialysis. EXAM: CT HEAD WITHOUT CONTRAST TECHNIQUE: Contiguous axial images were obtained from the base of the skull through the vertex without intravenous contrast. COMPARISON:  CT head 09/17/2012 FINDINGS: Severely motion degraded exam, improved somewhat on multiple subsequent attempts at imaging. BRAIN: No intraparenchymal hemorrhage, mass effect nor midline shift. Mild to moderate parenchymal brain volume loss. No hydrocephalus. Patchy to confluent supratentorial white matter hypodensities. No acute large vascular territory infarcts. No abnormal extra-axial fluid collections. Basal cisterns are patent. VASCULAR: Mild calcific atherosclerosis of the carotid siphons. SKULL: No skull fracture. No significant scalp soft tissue swelling. SINUSES/ORBITS: Right mastoid effusion.The included ocular globes and orbital contents are non-suspicious. OTHER: None. IMPRESSION: 1.  No acute intracranial process on this motion degraded exam. 2.  Mild-moderate parenchymal brain volume loss, advanced for age. 3.  Moderate chronic small vessel ischemic changes. Electronically Signed   By: Elon Alas M.D.   On: 02/22/2018 00:00    Dg Chest Port 1 View  Result Date: 02/21/2018 CLINICAL DATA:  Seizure. EXAM: PORTABLE CHEST 1 VIEW COMPARISON:  Chest CT 09/24/2017, included lung bases from abdominal CT 02/02/2018 FINDINGS: Despite repeat exam, the left costophrenic angle is excluded from the field of view. The heart is enlarged. Left pleural effusion suboptimally evaluated on portable exam. Mild right infrahilar atelectasis. Mild vascular congestion. No pneumothorax. Chronic change about the left shoulder, partially included. IMPRESSION: 1. Cardiomegaly and vascular congestion. 2. Left pleural effusion not well assessed by portable technique. Electronically Signed   By: Jeb Levering M.D.   On: 02/21/2018 19:58    PMH:   Past Medical History:  Diagnosis Date  . Anemia   . Anxiety   . Arthritis   . Asthma    per pt hx  . End stage renal disease on home HD 07/10/2011   Started HD in September 2012 at [facility] with a tunneled HD catheter, now on home HD with NxtStage. Dialyzing through AVF L lower arm with buttonhole technique as of mid 2014. His brother does the HD treatments at home.  They are roommates for 23 years.  The brother works 3rd shift and gets off about 8am and then puts Mr Jeffrey [name] on HD in the morning after getting home. Most of the time he does HD about 4 times a week, for about 4 hours per treatment. Cause of ESRD was HTN according to patient. He says he let his health go and ending up with complications, and that he didn't like seeing doctors in those days.  He says he was diagnosed with severe HTN  when he lived in [place] in his 9's.   . Hepatitis B carrier ([place])   . HIV infection ([place])   . Hypertension   . Hyperthyroidism    normal now  . Pneumonia several yrs ago  . Seizure ([place])   . Seizures ([place]) 06/02/2011   x 1 none since  . Thrombocytopenia ([facility])     PSH:   Past Surgical History:  Procedure Laterality Date  . AV FISTULA PLACEMENT  06/02/11   Left radiocephalic AVF  . BIOPSY  02/17/2018    Procedure: BIOPSY;  Surgeon: Milus Banister, MD;  Location: [facility] ENDOSCOPY;  Service: Endoscopy;;  . COLONOSCOPY    . COLONOSCOPY WITH PROPOFOL N/A 01/27/2018   Procedure: COLONOSCOPY WITH PROPOFOL;  Surgeon: [name], MD;  Location: [facility] ENDOSCOPY;  Service: Gastroenterology;  Laterality: N/A;  . CORONARY ANGIOPLASTY    . ESOPHAGOGASTRODUODENOSCOPY N/A 02/17/2018   Procedure: ESOPHAGOGASTRODUODENOSCOPY (EGD);  Surgeon: Milus Banister, MD;  Location: [facility] ENDOSCOPY;  Service: Endoscopy;  Laterality: N/A;  . ESOPHAGOGASTRODUODENOSCOPY (EGD) WITH PROPOFOL N/A 01/27/2018   Procedure: ESOPHAGOGASTRODUODENOSCOPY (EGD) WITH PROPOFOL;  Surgeon: [name], MD;  Location: [facility] ENDOSCOPY;  Service: Gastroenterology;  Laterality: N/A;  . EUS N/A 02/17/2018   Procedure: UPPER ENDOSCOPIC ULTRASOUND (EUS) RADIAL;  Surgeon: Milus Banister, MD;  Location: [facility] ENDOSCOPY;  Service: Endoscopy;  Laterality: N/A;  . fistulaogram     x 2 last 2 years  . IR FLUORO GUIDE CV LINE RIGHT  09/16/2017  . IR US GUIDE VASC ACCESS RIGHT  09/16/2017  . IRRIGATION AND DEBRIDEMENT KNEE Left 09/15/2017   Procedure: IRRIGATION AND DEBRIDEMENT KNEE; arthroscopic clean out;  Surgeon: [name], MD;  Location: [facility] ORS;  Service: Orthopedics;  Laterality: Left;  . OTHER SURGICAL HISTORY     removal temporary HD catheter   . REVISON OF ARTERIOVENOUS FISTULA Left 10/10/2015   Procedure: REVISON OF LEFT RADIOCEPHALIC ARTERIOVENOUS FISTULA;  Surgeon: Angelia Mould, MD;  Location: [facility];  Service: Vascular;  Laterality: Left;  . REVISON OF ARTERIOVENOUS FISTULA Left 02/07/2016   Procedure: REPAIR OF PSEUDO-ANEUREYSM OF LEFT ARM  ARTERIOVENOUS FISTULA;  Surgeon: Angelia Mould, MD;  Location: [facility];  Service: Vascular;  Laterality: Left;  . REVISON OF ARTERIOVENOUS FISTULA Left 01/06/4080   Procedure: PLICATION OF LEFT ARM RADIOCEPHALIC ARTERIOVENOUS FISTULA PSEUDOANEURYSM;  Surgeon: Angelia Mould, MD;  Location:  [facility];  Service: Vascular;  Laterality: Left;  . RIGHT/LEFT HEART CATH AND CORONARY ANGIOGRAPHY N/A 02/17/2017   Procedure: Right/Left Heart Cath and Coronary Angiography;  Surgeon: Sherren Mocha, MD;  Location: [facility] CV LAB;  Service: Cardiovascular;  Laterality: N/A;  . TEE WITHOUT CARDIOVERSION N/A 09/11/2016   Procedure: TRANSESOPHAGEAL ECHOCARDIOGRAM (TEE);  Surgeon: Dorothy Spark, MD;  Location: [facility];  Service: Cardiovascular;  Laterality: N/A;    Allergies:  Allergies  Allergen Reactions  . Lisinopril Anaphylaxis and Shortness Of Breath    Throat swelling  . Penicillins Other (See Comments)    Childhood allergy Has patient had a PCN reaction causing immediate rash, facial/tongue/throat swelling, SOB or lightheadedness with hypotension: Unk Has patient had a PCN reaction causing severe rash involving mucus membranes or skin necrosis: Unk Has patient had a PCN reaction that required hospitalization: Unk Has patient had a PCN reaction occurring within the last 10 years: No If all of the above answers are "NO", then may proceed with Cephalosporin use.     Medications:   Prior to Admission medications   Medication  Sig Start Date End Date Taking? Authorizing Provider  albuterol (PROVENTIL) (2.5 MG/3ML) 0.083% nebulizer solution Take 2.5 mg by nebulization every 6 (six) hours as needed for wheezing or shortness of breath.   Yes [provider]  ALPRAZolam (XANAX) 0.25 MG tablet Take 0.25 mg by mouth 3 (three) times daily as needed.  11/29/17  Yes [provider]  aspirin 81 MG chewable tablet Chew 1 tablet (81 mg total) by mouth daily. 10/15/17  Yes Vaughan Basta, MD  bictegravir-emtricitabine-tenofovir AF (BIKTARVY) 50-200-25 MG TABS tablet Take 1 tablet by mouth daily. 12/27/17  Yes Campbell Riches, MD  calcium acetate (PHOSLO) 667 MG capsule Take 667-2,001 mg by mouth See admin instructions. 1,334-2,001 mg three times a day with each meal  and 667-1,334 mg with each snack 06/06/15  Yes [provider]  carvedilol (COREG) 25 MG tablet Take 1 tablet (25 mg total) by mouth 2 (two) times daily with a meal. 10/05/17  Yes Alphonzo Grieve, MD  cloNIDine (CATAPRES - DOSED IN MG/24 HR) 0.1 mg/24hr patch UNWRAP AND APPLY 1 PATCH TO SKIN EVERY 7 DAYS 01/28/18  Yes Velna Ochs, MD  clotrimazole-betamethasone (LOTRISONE) cream Apply 1 application topically 2 (two) times daily. Patient taking differently: Apply 1 application topically 2 (two) times daily as needed (irritation).  09/08/17  Yes Campbell Riches, MD  doxazosin (CARDURA) 4 MG tablet Take 4 mg by mouth at bedtime.  07/04/17  Yes [provider]  epoetin alfa (EPOGEN,PROCRIT) 2000 UNIT/ML injection Inject 2,000 Units into the vein 4 (four) times a week.    Yes [provider]  ethyl chloride spray Apply 1 application topically daily as needed (HD).  05/20/15  Yes [provider]  heparin 1000 UNIT/ML injection Inject 3,000 Units into the vein 4 (four) times a week.    Yes [provider]  hydrALAZINE (APRESOLINE) 100 MG tablet Take 100 mg by mouth 3 (three) times daily.   Yes [provider]  iron sucrose (VENOFER) 20 MG/ML injection Inject 100 mg into the vein once a week. On Monday   Yes [provider]  multivitamin (RENA-VIT) TABS tablet Take 1 tablet by mouth daily.     Yes [provider]  traZODone (DESYREL) 50 MG tablet TAKE 1 TABLET(50 MG) BY MOUTH AT BEDTIME AS NEEDED FOR SLEEP 01/31/18  Yes Campbell Riches, MD  VOLTAREN 1 % GEL APPLY 2 GRAMS EXTERNALLY TO THE AFFECTED AREA FOUR TIMES DAILY Patient taking differently: Apply 2 g topically 3 (three) times daily as needed (pain).  09/08/17  Yes Campbell Riches, MD  omeprazole (PRILOSEC) 20 MG capsule Take 1 capsule (20 mg total) by mouth daily. Patient not taking: Reported on 02/21/2018 01/31/18   [name], MD    Discontinued Meds:  There are no  discontinued medications.  Social History:  reports that he quit smoking about 2 years ago. His smoking use included cigarettes. He quit after 10.00 years of use. He has never used smokeless tobacco. He reports that he does not drink alcohol or use drugs.  Family History:   Family History  Problem Relation Age of Onset  . Hypertension Mother   . Diabetes Mother   . Cancer Father     Blood pressure (!) 234/99, pulse 86, temperature 97.8 F (36.6 C), resp. rate (!) 21, SpO2 98 %. General appearance: appears stated age and slowed mentation Head: Normocephalic, without obvious abnormality, atraumatic Eyes: negative Neck: no adenopathy, no carotid bruit, supple, symmetrical, trachea midline  and thyroid not enlarged, symmetric, no tenderness/mass/nodules Back: symmetric, no curvature. ROM normal. No CVA tenderness. Resp: poor air movement Chest wall: no tenderness Cardio: regular rate and rhythm, S1, S2 normal, no murmur, click, rub or gallop Access: Left Cimino good thrill GI: soft, non-tender; bowel sounds normal; no masses,  no organomegaly Extremities: extremities normal, atraumatic, no cyanosis or edema Pulses: 2+ and symmetric Skin: Skin color, texture, turgor normal. No rashes or lesions Lymph nodes: Cervical, supraclavicular, and axillary nodes normal. Neurologic: Mental status: Postictal       Sameul Tagle, Hunt Oris, MD 02/22/2018, 4:10 AM

## 2018-02-22 NOTE — Procedures (Signed)
Patient seen on Hemodialysis. QB 300, UF goal 3L Treatment adjusted as needed.  Elmarie Shiley MD [facility]. Office # [phone] Pager # [phone] 1:33 PM

## 2018-02-22 NOTE — ED Notes (Signed)
Spoke with dialysis RN - will accept pt at this time

## 2018-02-22 NOTE — Progress Notes (Signed)
Paged to bedside by RN for evaluation of restraints, line bleeding.  Upon arrival to the room patient was resting comfortably in bed in no acute distress. Patient remained calm and redirectable by family who was bedside throughout 20 minute interview. Patient with bilateral safety restraints, including on LUE which contains fistula - apparently these have been in place all day? Patient with clean, dry bandage intact over R IJ catheter.  Vitals: HR 70s-80s, BP 200s/90s CV/Pulm: RRR, lungs clear to auscultation bilaterally in anterior lung fields Neuro: Patient spontaneously moves all four extremities, does not follow commands for formal testing. Unchanged from yesterday's exam. Patient with intermittent response to brother's voice, which represents slight improvement in mental status.   A/P: Patient with ongoing AMS, post-ictal state. Patient will likely continue to improve with dialysis, as uremia was thought to be the cause of initial seizure. Given AMS and reports of ongoing attempts by patient to pull at IJ line, will give RUE soft restraint and hand mitten for LUE, as continuing soft restraints on this extremity compromises the patient's fistula unnecessarily. Will closely monitor mental status for improvement and removal of these restraints as early as possible.   Patient has standing PRN order for hydralazine if SBP >180, none given throughout day today for unknown reason (last in ED before dialysis). Recommend administration of PRN hydralazine as written. This is needed as patient NPO given altered mental status and cannot take usual oral home medications. Swallow evaluation ordered.   If bleeding from IJ site continues, would recommend contacting nephrology for evaluation to see if catheter is indeed still needed (stated in notes that they would reevaluate after HD). If catheter no longer needed, could contact PCCM for removal of line. Otherwise, PCCM recommended thrombi-pad for this problem  after placement of line, would replace this if further oozing occurs.

## 2018-02-22 NOTE — ED Notes (Signed)
Spoke with Dr. Archie Balboa, nephrology, per request of Dr. Rogene Houston, in reference to hemodialysis catheter continually oozing bright red blood - questioned need for this catheter as pt has a patent graft to LFA - Dr. Archie Balboa states hemodialysis catheter to rgt IJ to remain intact until after pt is ran then would reassess need for catheter

## 2018-02-22 NOTE — Procedures (Signed)
HPI:  62 y/o with seizure  TECHNICAL SUMMARY:  A multichannel referential and bipolar montage EEG using the standard international 10-20 system was performed on the patient described as initially agitated and then asleep.  The recording opened at 8:59 AM.  From 8:59 AM to 9:34 AM there was significant amounts of muscle and myogenic artifact during the recording, and the recording is uninterpretable during that time.  At 9:34 AM, the background becomes quiet and the patient is noted to be in sleep.  No waking rhythm is noted.  The background is interpretable from 9:34 AM to 10:06 AM.  Low voltage fast activity was distributed symmetrically.  ACTIVATION:  Stepwise photic stimulation and hyperventilation are not performed.  EPILEPTIFORM ACTIVITY:  There were no spikes, sharp waves or paroxysmal activity in the portion of the tracing that can be interpreted.   SLEEP: As above, stage II sleep is noted.   IMPRESSION:  This EEG demonstrated no focal, hemispheric, or lateralizing features.  No clear epileptiform activity is noted.  Waking rhythm is not noted during the EEG and comment cannot be made on this.  In addition, as above, the first 35 minutes of the recording were full of muscle/myogenic artifact and nothing can be interpreted during that time.  Clinical correlation is required.

## 2018-02-22 NOTE — ED Notes (Signed)
Report given to [facility] on dialysis - will call her back after pt is ready for dialysis based on bed availability on dialysis unit

## 2018-02-22 NOTE — ED Notes (Signed)
Pt temporary hemodialysis catheter bleeding, provider called to make aware. Pressure dressing and some pressure applied to site

## 2018-02-22 NOTE — ED Notes (Signed)
EEG in progress 

## 2018-02-22 NOTE — ED Notes (Signed)
CBC drawn.

## 2018-02-22 NOTE — Procedures (Signed)
Hemodialysis Catheter Insertion Procedure Note ARMIN YERGER [id number] Jul 29, 1956  Procedure: Insertion of Hemodialysis Catheter Indications: Hemodialysis  Procedure Details Consent: Risks of procedure as well as the alternatives and risks of each were explained to the (patient/caregiver).  Consent for procedure obtained. Time Out: Verified patient identification, verified procedure, site/side was marked, verified correct patient position, special equipment/implants available, medications/allergies/relevent history reviewed, required imaging and test results available.  Performed  Maximum sterile technique was used including antiseptics, cap, gloves, gown, hand hygiene, mask and sheet. Skin prep: Chlorhexidine; local anesthetic administered A antimicrobial bonded/coated triple lumen catheter was placed in the right internal jugular vein using the Seldinger technique.  Evaluation Blood flow good Complications: No apparent complications Patient did tolerate procedure well. Chest X-ray ordered to verify placement.  CXR: pending.  Procedure performed under direct ultrasound guidance for real time vessel cannulation.      Montey Hora, [facility] Pgr: [phone]  or [phone] 02/22/2018, 5:28 AM

## 2018-02-22 NOTE — ED Notes (Signed)
Dressing to rgt lateral neck area at site of hemodialysis catheter noted to have constant oozing of bright red blood from beneath dressing - dressing currently saturated; current dressing removed - new sterile dressing placed - pt tolerated procedure well

## 2018-02-22 NOTE — Progress Notes (Signed)
eLink Physician-Brief Progress Note Patient Name: Jeffrey [name] DOB: 04-10-1956 MRN: [id number]   Date of Service  02/22/2018  HPI/Events of Note  Bleeding from R IJ Hemodialysis Catheter site.   eICU Interventions  Will order: 1. Thrombi-pad to R IJ Hemodialysis Catheter site.      Intervention Category Minor Interventions: Agitation / anxiety - evaluation and management  Sommer,Steven Eugene 02/22/2018, 6:01 AM

## 2018-02-22 NOTE — ED Notes (Signed)
Spoke with Dr. Rogene Houston (team who originally placed hemodialysis catheter) - requested he come in to assess bleeding at insertion site of catheter - states it "would be awhile"; however, he would see what he could do; informed MD that we had changed dressing x3 since 0700 this am and that pt was currently bleeding through surgicel dressing - pressure previously applied as well which was unsuccessful in stopping bleeding

## 2018-02-22 NOTE — Progress Notes (Signed)
Upon shift change, pt was in restraints and actively bleeding from catheter site on neck....reinforced dressing and contacted on call...on call is coming to see pt now.

## 2018-02-22 NOTE — Progress Notes (Signed)
   Subjective: The patient was lying in his bed today upon entering the room. He is able to confirm his name with a head nod but can only verbally whisper. As per patients brother who was present during the interview today, he is verbose at baseline but unable to speak at this time. This represented a significant change from his baseline and is similar to how he presented years prior with a seizure and was determined to be in renal failure.   Objective:  Vital signs in last 24 hours: Vitals:   02/22/18 0330 02/22/18 0431 02/22/18 0500 02/22/18 0600  BP: (!) 234/99 (!) 234/102 (!) 190/98 (!) 217/95  Pulse: 86  93 94  Resp:    19  Temp:      TempSrc:      SpO2: 98%  100% 100%   ROS negative except as per HPI.  Assessment/Plan:  Principal Problem:   Seizure ([place]) Active Problems:   HIV disease ([place])   End stage renal disease on home HD   Essential hypertension   Anemia of chronic kidney failure, unspecified stage   Mass of duodenum  Assessment: Ryot Burrous is a 62 year old male with a PMHx notable for ESRD on home HD, seizure, HTN, HIV w/ undetectable most recent viral load, and anemia who was noted to have rhythmic muscle contractions and acute encephalopathy.   Plan: Seizure: Prior seizure occurred at the time he was diagnosed with diabetes in 2012 as per brother. I currently feel that his presentation for this admission is related to his uremic state with no other clear etiology identified. Head CT unremarkable and infectious evaluation unrevealing at this time as well.  -Call placed to neurology for assistance with medication and discussion regarding the prolonged mental status changes although I feel this will improve with dialysis and time, awaiting call back -Continue keppra 500mg  q12 hours -Continue neuro checks -Stepdown level care with Tele -Urgent HD as per Nephrology -BMP in am -CBC in am, trend fever curve   ESRD on Home HD: Will need urgent HD as I believe this  is contributing to the prolonged post ictal state.  --Nephrology consulted for HD --Renal panel in am  HTN: Severely elevated today. Patient has not been completing HD at home due to weakness nor has he been taking all of his home medications.  --HD  --Clonidine patch --Hydralazine 20mg  IV Q 8 Hours PRN for HTN  HIV: Last viral load undetectable.  --Continue home antivirals   Diet: NPO Code: Full Fluids: None, needs HD Dvt PPX: Heparin during dialysis  Dispo: Anticipated discharge in approximately however many days until he is cleared for discharge.   Kathi Ludwig, MD 02/22/2018, 6:51 AM Pager: Pager# [phone]

## 2018-02-23 ENCOUNTER — Inpatient Hospital Stay (HOSPITAL_COMMUNITY): Payer: Medicare Other

## 2018-02-23 DIAGNOSIS — E440 Moderate protein-calorie malnutrition: Secondary | ICD-10-CM

## 2018-02-23 DIAGNOSIS — R531 Weakness: Secondary | ICD-10-CM

## 2018-02-23 DIAGNOSIS — R011 Cardiac murmur, unspecified: Secondary | ICD-10-CM

## 2018-02-23 DIAGNOSIS — Z79899 Other long term (current) drug therapy: Secondary | ICD-10-CM

## 2018-02-23 LAB — CBC WITH DIFFERENTIAL/PLATELET
BASOS ABS: 0 10*3/uL (ref 0.0–0.1)
Basophils Relative: 0 %
EOS ABS: 0.1 10*3/uL (ref 0.0–0.7)
Eosinophils Relative: 1 %
HCT: 25.9 % — ABNORMAL LOW (ref 39.0–52.0)
HEMOGLOBIN: 8.5 g/dL — AB (ref 13.0–17.0)
LYMPHS PCT: 11 %
Lymphs Abs: 0.6 10*3/uL — ABNORMAL LOW (ref 0.7–4.0)
MCH: 31.7 pg (ref 26.0–34.0)
MCHC: 32.8 g/dL (ref 30.0–36.0)
MCV: 96.6 fL (ref 78.0–100.0)
MONOS PCT: 10 %
Monocytes Absolute: 0.6 10*3/uL (ref 0.1–1.0)
Neutro Abs: 4.5 10*3/uL (ref 1.7–7.7)
Neutrophils Relative %: 78 %
PLATELETS: 76 10*3/uL — AB (ref 150–400)
RBC: 2.68 MIL/uL — AB (ref 4.22–5.81)
RDW: 26.5 % — ABNORMAL HIGH (ref 11.5–15.5)
SMEAR REVIEW: DECREASED
WBC: 5.8 10*3/uL (ref 4.0–10.5)

## 2018-02-23 LAB — RENAL FUNCTION PANEL
ALBUMIN: 3.4 g/dL — AB (ref 3.5–5.0)
ANION GAP: 18 — AB (ref 5–15)
BUN: 85 mg/dL — ABNORMAL HIGH (ref 6–20)
CALCIUM: 8 mg/dL — AB (ref 8.9–10.3)
CO2: 24 mmol/L (ref 22–32)
CREATININE: 12.92 mg/dL — AB (ref 0.61–1.24)
Chloride: 94 mmol/L — ABNORMAL LOW (ref 101–111)
GFR calc Af Amer: 4 mL/min — ABNORMAL LOW (ref >60)
GFR calc non Af Amer: 4 mL/min — ABNORMAL LOW (ref >60)
GLUCOSE: 82 mg/dL (ref 65–99)
PHOSPHORUS: 7.5 mg/dL — AB (ref 2.5–4.6)
Potassium: 3.9 mmol/L (ref 3.5–5.1)
SODIUM: 136 mmol/L (ref 135–145)

## 2018-02-23 LAB — GLUCOSE, CAPILLARY
GLUCOSE-CAPILLARY: 132 mg/dL — AB (ref 65–99)
Glucose-Capillary: 71 mg/dL (ref 65–99)
Glucose-Capillary: 90 mg/dL (ref 65–99)
Glucose-Capillary: 82 mg/dL (ref 65–99)

## 2018-02-23 LAB — HEPATITIS B SURFACE ANTIGEN

## 2018-02-23 LAB — HEPATITIS B SURFACE ANTIBODY,QUALITATIVE: Hep B S Ab: NONREACTIVE

## 2018-02-23 LAB — HEPATITIS B SURFACE AG, CONFIRM: HBsAg Confirmation: POSITIVE — AB

## 2018-02-23 MED ORDER — CARVEDILOL 12.5 MG PO TABS
25.0000 mg | ORAL_TABLET | Freq: Two times a day (BID) | ORAL | Status: DC
  Administered 2018-02-23 – 2018-02-27 (×9): 25 mg via ORAL
  Filled 2018-02-23 (×9): qty 2

## 2018-02-23 MED ORDER — HYDRALAZINE HCL 50 MG PO TABS
50.0000 mg | ORAL_TABLET | Freq: Three times a day (TID) | ORAL | Status: DC
  Administered 2018-02-23 – 2018-02-24 (×3): 50 mg via ORAL
  Filled 2018-02-23 (×3): qty 1

## 2018-02-23 MED ORDER — "THROMBI-PAD 3""X3"" EX PADS"
1.0000 | MEDICATED_PAD | Freq: Once | CUTANEOUS | Status: AC
  Administered 2018-02-23: 1 via TOPICAL
  Filled 2018-02-23: qty 1

## 2018-02-23 NOTE — Progress Notes (Signed)
Initial Nutrition Assessment  DOCUMENTATION CODES:   Non-severe (moderate) malnutrition in context of chronic illness  INTERVENTION:  RD to order supplements when NPO lifted.   NUTRITION DIAGNOSIS:   Moderate Malnutrition related to chronic illness(ESRD, ) as evidenced by percent weight loss, moderate fat depletion, moderate muscle depletion.  GOAL:   Patient will meet greater than or equal to 90% of their needs  MONITOR:   Diet advancement, Skin, Weight trends, Labs, I & O's  REASON FOR ASSESSMENT:   Low [place]    ASSESSMENT:   62 y.o. M admitted on 02/21/18 for acidosis secondary to hypoxia, seizures, and uremia with a malignant mass in the duodenum. PMH of ESRD on home HD since 2012, HIV, HTN, thrombocytopenia, hepatitis B carrier, anemia, asthma, arthitis, and seizures.   EDW 179.5 lbs per pt brother. Unsure if this is current EDW as pt currently 166 lbs. HD removed 2.5 L per HD order, goal 3 L per nephrology note.   Pt unable to answer dietetic intern questions past head movements. Pt reports losing weight recently and having a decreased appetite. Pt and brother unable to give a specific amount of weight lost. Per chart review pt lost 20% of body weight since March 2019 - significant; unable to determine how much of % weight lost was from fluid. Pt lost 12.5 % of his reported dry weight within 6 months- significant.   Brother reports pt eating sparatically for the last two weeks (less than two fistfuls a day of food). Brother reports that before this time period pt was eating well and had a good appetite; unable to give detailed nutrition history. Pt open to supplementation once NPO lifted.   Medications reviewed: coreg, catapres, heparin, apresoline, keppra.   Labs reviewed: Cl 94 (L), BUN 85 (H), Creatinine 12.92 (H), phosphorus 7.5 (H), albumin 3.4 (L), GFR 4 (L), RBC 2.68 (L), hemoglobin 8.5 (L), HCT 25.9 (L), platelets 76 (L).   Net I/O since admission: -4.8  L  NUTRITION - FOCUSED PHYSICAL EXAM:    Most Recent Value  Orbital Region  Mild depletion  Upper Arm Region  Moderate depletion  Thoracic and Lumbar Region  Mild depletion  Buccal Region  Moderate depletion  Temple Region  Moderate depletion  Clavicle Bone Region  Moderate depletion  Clavicle and Acromion Bone Region  Moderate depletion  Scapular Bone Region  Unable to assess  Dorsal Hand  Mild depletion  Patellar Region  Moderate depletion  Anterior Thigh Region  Moderate depletion  Posterior Calf Region  Moderate depletion  Edema (RD Assessment)  None  Hair  Reviewed  Eyes  Reviewed  Mouth  Reviewed  Skin  Reviewed  Nails  Reviewed     Diet Order:   Diet Order           Diet NPO time specified  Diet effective now          EDUCATION NEEDS:   Education needs have been addressed  Skin:  Skin Assessment: Reviewed RN Assessment  Last BM:  02/22/18  Height:   Ht Readings from Last 1 Encounters:  02/17/18 6' (1.829 m)    Weight:   Wt Readings from Last 1 Encounters:  02/23/18 159 lb 6.3 oz (72.3 kg)    Ideal Body Weight:  80.9 kg  BMI:  Body mass index is 21.62 kg/m.  Estimated Nutritional Needs:   Kcal:  2000-2200 kcal  Protein:  110-125 grams  Fluid:  >/= 2 L or per MD  Hope Budds, Dietetic Intern

## 2018-02-23 NOTE — Progress Notes (Signed)
SLP Cancellation Note  Patient Details Name: Jeffrey [name] MRN: [id number] DOB: 1955/10/10   Cancelled treatment:       Reason Eval/Treat Not Completed: Fatigue/lethargy limiting ability to participate. Discussed with pts brother, MD and RN. Pt is typically able to eat and drink without difficulty. He was able to take sips and meds today with RN. He is currently unarousable but is expected to improve after rest. Given no concern for acute swallow impairment other than lethargy, recommended pt resume a diet without SLP assessment. Will d/c eval at this time. Please reorder if concerns arise.    Virginie Josten, Katherene Ponto 02/23/2018, 3:49 PM

## 2018-02-23 NOTE — Plan of Care (Signed)
  Problem: Education: Goal: Knowledge of General Education information will improve Outcome: Progressing   Problem: Health Behavior/Discharge Planning: Goal: Ability to manage health-related needs will improve Outcome: Progressing   Problem: Clinical Measurements: Goal: Ability to maintain clinical measurements within normal limits will improve Outcome: Progressing   Problem: Elimination: Goal: Will not experience complications related to bowel motility Outcome: Progressing   Problem: Pain Managment: Goal: General experience of comfort will improve Outcome: Progressing   Problem: Safety: Goal: Ability to remain free from injury will improve Outcome: Progressing   Problem: Activity: Goal: Ability to tolerate increased activity will improve Outcome: Progressing   Problem: Education: Goal: Knowledge of disease and its progression will improve Outcome: Progressing   Problem: Coping: Goal: Development of coping mechanisms to deal with changes in body function or appearance will improve Outcome: Progressing   Problem: Fluid Volume: Goal: Compliance with measures to maintain balanced fluid volume will improve Outcome: Progressing   Problem: Health Behavior/Discharge Planning: Goal: Ability to manage health-related needs will improve Outcome: Progressing   Problem: Nutritional: Goal: Ability to make healthy dietary choices will improve Outcome: Progressing   Problem: Physical Regulation: Goal: Complications related to the disease process, condition or treatment will be avoided or minimized Outcome: Progressing

## 2018-02-23 NOTE — Progress Notes (Signed)
eLink Physician-Brief Progress Note Patient Name: Jeffrey [name] DOB: 11/04/1955 MRN: [id number]   Date of Service  02/23/2018  HPI/Events of Note  Oozing from dialysis cath site s/p removal  eICU Interventions  Thrombin pad x 1 followed by manual pressure for 10 minutes         Evola Hollis U Annleigh Knueppel 02/23/2018, 9:06 PM

## 2018-02-23 NOTE — Procedures (Signed)
Patient seen on Hemodialysis. QB 300, UF goal 3L Treatment adjusted as needed.  Elmarie Shiley MD [facility]. Office # [phone] Pager # [phone] 9:38 AM

## 2018-02-23 NOTE — Progress Notes (Addendum)
Subjective: The patient was lying in his bed today upon entering the room.  He appears much more oriented and is able to provide [name] with his location, name, reason for visit.  His mitten type restraints  were removed given his improved mental status and with his agreement not to attempt to remove right-sided IJ catheter.  We agreed to pursue removal of his catheter as per nephrology's recommendations.  He denies acute complaints including fever, chills, nausea, weakness, abdominal pain, chest pain, or other concerns today.  He was informed that his test is improving and you believe the seizure was likely secondary to uremia should continue to improve.  Objective:  Vital signs in last 24 hours: Vitals:   02/23/18 0008 02/23/18 0402 02/23/18 0457 02/23/18 0500  BP: (!) 186/94 (!) 219/92 (!) 182/87   Pulse: 85 88    Resp: 16 18    Temp: 98.3 F (36.8 C) 98 F (36.7 C)    TempSrc: Oral Oral    SpO2: 100% 99%    Weight: 182 lb 15.7 oz (83 kg)   169 lb 1.5 oz (76.7 kg)   Physical Exam  Constitutional: He is oriented to person, place, and time. He appears well-developed and well-nourished. No distress.  HENT:  Right IJ continues to ooze blood   Cardiovascular: Normal rate. An irregular rhythm present.  Murmur ([place]) heard. APC's noted on tele consistent with irregular rhythm noted on auscultation    Pulmonary/Chest: Effort normal and breath sounds normal.  Abdominal: Soft. Bowel sounds are normal. He exhibits no distension.  Musculoskeletal: He exhibits no edema.  Neurological: He is alert and oriented to person, place, and time.  Skin: Skin is warm. He is not diaphoretic.   Assessment/Plan:  Principal Problem:   Seizure ([place]) Active Problems:   HIV disease ([place])   End stage renal disease on home HD   Essential hypertension   Anemia of chronic kidney failure, unspecified stage   Mass of duodenum   Lactic acidosis   Acute uremia  Assessment: Jeffrey [name] is a 62 year old  male with a PMHx notable for ESRD on home HD, prior seizure in 2012, HTN, HIV w/ undetectable most recent viral load, and anemia who was noted to have rhythmic muscle contractions and acute encephalopathy.  This is thought to be secondary to metabolic derangement from insufficient dialysis in the setting of ESRD.  Patient previously presented with a seizure in 2012 and was diagnosed with ESRD as the inciting etiology prior to being placed on dialysis at that time.  As per brother this event closely resembles his initial presenting seizure and subsequent renal failure diagnosis.  He improved notably with hemodialysis following a prolonged post ictal/acute encephalopathic state.  Patient underwent multiple rounds of hemodialysis.  Plan: Seizure: Prior seizure occurred at the time he was diagnosed with diabetes in 2012 as per brother. I currently feel that his presentation for this admission is related to his uremic state with no other clear etiology identified. Head CT unremarkable and infectious evaluation unrevealing at this time with notable improvement following HD.  -Call placed to neurology for assistance with medications with ongoing need to antiepileptics given the likelyhood of the inciting event. Call returned By Jeffrey [name] the PA who advised continuing [place] at renal dosing and follow-up with Neurology as an outpatient following discharge.  -Continue neuro checks -Stepdown level care with Tele, consider Tele transfer later today if he continues to improve -Continue HD as per Nephrology -Renal function panel indicating  marked improvement in his BUN down from 178 the day prior. -CBC without leukocytosis, patient afebrile  ESRD on Home HD: Will need HD until near baseline and dry weight prior to discharge.  --Nephrology consulted for HD, we appreciate their continued assistance --Renal panel in am  Generalized weakness: Leading up the the admission the patient and brother noted concern for  generalized weakness that lead to the incomplete dialysis sessions. As such, I am concerned that this will again occur if a cause is not identified and treated. I feel strongly that his newly diagnosed small bowel mass may indeed be a malignancy which if positive may be the etiology of his generalized weakness, decreased PO intake causing the incomplete dialysis sessions leading to the seizure.    MRI brain has been ordered to assess for intracranial abnormalities but has yet to be completed. This will be beneficial in assessing for metastatic lesion but given her negative CT head this is less concerning.   HTN: Severely elevated the prior day. Patient had not been completing full HD sessions at home due to weakness nor has he been taking all of his home medications. He was not given his PRN hydralazine by the ED staff as directed resulting in continued elevation of his BP. Improved today following HD. --Continue HD  --Continue Clonidine patch --Continue half of home Hydral at Hydralazine 50mg  Q8 hours --Continue carvedilol 25mg  BID  HIV: Last viral load undetectable.  --Continued home antivirals   Diet: NPO Code: Full Fluids: None, needs HD Dvt PPX: Heparin during dialysis  Dispo: Anticipated discharge in approximately however many days until he is cleared for discharge.   Jeffrey Ludwig, MD 02/23/2018, 6:58 AM Pager: Pager# [phone]

## 2018-02-23 NOTE — Progress Notes (Signed)
During RN hand off RN noted fresh blood on pt pillow and bottom sheet at rt side of neck . With assessment RN noted Previous Rt IJ  Site dressing is soaked with blood . Presure dressing applied   foro few minutes  but site still bleeding.  Internal medicine residence on call notified.  RN asked to call CC PM. MD paged.  MD returned call and order given .   RN will administer as ordered.

## 2018-02-23 NOTE — Progress Notes (Signed)
SLP Cancellation Note  Patient Details Name: JENSEN KILBURG MRN: [id number] DOB: 01/29/1956   Cancelled treatment:       Reason Eval/Treat Not Completed: Patient at procedure or test/unavailable. In HD, will return later today to attempt SLP swallow eval.    Billye Pickerel, Katherene Ponto 02/23/2018, 9:23 AM

## 2018-02-23 NOTE — Progress Notes (Signed)
Patient ID: Jeffrey [name], male   DOB: 12-17-1955, 62 y.o.   MRN: [id number] [facility] Progress Note   Assessment/ Plan:   1.  Seizures: Suspect he is significant uremia may have had a large contributory factor to his seizure upon presentation.  Reeducated him regarding compliance with dialysis. 2. ESRD: On home hemodialysis with a pattern of poor adherence with prescribed therapy evident on his admission labs-continue hemodialysis today (his second in 2 days) with his next dialysis scheduled again for Friday should he still be here.  Will order for removal of his right IJ tunneled hemodialysis catheter now that he can hold his arm steady/mental status better. 3. Anemia: Secondary to anemia of chronic kidney disease/ESRD-iron studies pending.  Resume ESA. 4. CKD-MBD: Resume phosphorus binders/continue renal diet when able to adequately take orally. 5.  HIV infection: Continue antiretroviral therapy. 6. Hypertension: Blood pressure acceptable on current regimen, monitor with ultrafiltration/hemodialysis.  Subjective:   Reports that he feels somewhat better-fatigued overnight.  Sheepish when I asked him why he was not compliant with dialysis.   Objective:   BP (!) 143/80   Pulse (!) 108   Temp (!) 97.5 F (36.4 C) (Oral)   Resp 18   Wt 75.5 kg (166 lb 7.2 oz)   SpO2 100%   BMI 22.57 kg/m   Physical Exam: Gen: Comfortably resting in dialysis, awakens and engages in conversation CVS: Pulse regular tachycardia, S1 and S2 with ejection systolic murmur Resp: Anteriorly clear to auscultation, no rales/rhonchi Abd: Soft, flat, nontender Ext: No lower extremity edema, left radiocephalic fistula with palpable thrill/dressings intact  Labs: BMET Recent Labs  Lab 02/21/18 1947 02/22/18 0134 02/23/18 0800  NA 131* 132* 136  K 4.4 5.3* 3.9  CL 98* 94* 94*  CO2  --  13* 24  GLUCOSE 115* 131* 82  BUN >140* 178* 85*  CREATININE >18.00* 21.55* 12.92*  CALCIUM  --  7.7* 8.0*   PHOS  --   --  7.5*   CBC Recent Labs  Lab 02/21/18 1917 02/21/18 1947 02/22/18 0351 02/22/18 1143 02/23/18 0800  WBC 7.9  --  9.0 7.5 5.8  NEUTROABS 4.4  --  7.7  --  PENDING  HGB 9.4* 10.5* 9.5* 8.4* 8.5*  HCT 29.1* 31.0* 28.9* 25.3* 25.9*  MCV 99.0  --  96.0 94.8 96.6  PLT 152  --  111* 95* 76*   Medications:    . bictegravir-emtricitabine-tenofovir AF  1 tablet Oral Daily  . carvedilol  25 mg Oral BID WC  . Chlorhexidine Gluconate Cloth  6 each Topical Q0600  . Chlorhexidine Gluconate Cloth  6 each Topical Q0600  . [START ON 02/25/2018] cloNIDine  0.1 mg Transdermal Q Fri  . heparin  5,000 Units Subcutaneous Q8H  . hydrALAZINE  50 mg Oral Q8H   Elmarie Shiley, MD 02/23/2018, 9:31 AM

## 2018-02-23 NOTE — Progress Notes (Signed)
Paged by RN to evaluate bleeding from previous R IJ catheter site after removal earlier today.   Upon arrival patient's loose dressing soaked with blood with clot in place. Dressing removed and catheter insertion site (about 51mm in size) with slow oozing. Pressure dressing applied. Patient tolerated pressure dressing well. And am hopeful that this will stop bleeding. Told RN to continue to monitor and call PCCM for further instructions regarding continued oozing after pressure dressing placed.   Will continue to monitor throughout evening.

## 2018-02-23 NOTE — Progress Notes (Signed)
Medicine attending: I personally examined this patient today together with resident physician Dr. Kathi Ludwig and I concur with his evaluation and management plan which we discussed together. Dramatic improvement in mental status following dialysis.  Now alert, awake, oriented and interactive.  No focal neurologic deficits.  No further seizure activity.  Still some bloody oozing from the left IJ site.  We will ask nephrology if this can be removed.  His left wrist fistula appears to be functional.  He will be dialyzed again today. I doubt that he will require long-term antiseizure medication.  Seizure was situational secondary to metabolic abnormalities related to end-stage renal disease. Additional medical problems and disposition as summarized in Dr. Nelma Rothman note.  He is now alert enough to resume his oral antiretroviral and antihypertensive medications. Patient's brother at the bedside.  Patient status and management plan reviewed.

## 2018-02-24 LAB — CBC
HEMATOCRIT: 23.4 % — AB (ref 39.0–52.0)
HEMOGLOBIN: 7.5 g/dL — AB (ref 13.0–17.0)
MCH: 32.1 pg (ref 26.0–34.0)
MCHC: 32.1 g/dL (ref 30.0–36.0)
MCV: 100 fL (ref 78.0–100.0)
Platelets: 78 10*3/uL — ABNORMAL LOW (ref 150–400)
RBC: 2.34 MIL/uL — ABNORMAL LOW (ref 4.22–5.81)
RDW: 25.9 % — ABNORMAL HIGH (ref 11.5–15.5)
WBC: 5.6 10*3/uL (ref 4.0–10.5)

## 2018-02-24 LAB — GLUCOSE, CAPILLARY
GLUCOSE-CAPILLARY: 127 mg/dL — AB (ref 65–99)
GLUCOSE-CAPILLARY: 135 mg/dL — AB (ref 65–99)
GLUCOSE-CAPILLARY: 157 mg/dL — AB (ref 65–99)
Glucose-Capillary: 98 mg/dL (ref 65–99)
Glucose-Capillary: 111 mg/dL — ABNORMAL HIGH (ref 65–99)
Glucose-Capillary: 96 mg/dL (ref 65–99)

## 2018-02-24 LAB — APTT: aPTT: 74 seconds — ABNORMAL HIGH (ref 24–36)

## 2018-02-24 MED ORDER — MEGESTROL ACETATE 40 MG PO TABS
80.0000 mg | ORAL_TABLET | Freq: Two times a day (BID) | ORAL | Status: DC
  Administered 2018-02-24 – 2018-02-28 (×9): 80 mg via ORAL
  Filled 2018-02-24 (×10): qty 2

## 2018-02-24 MED ORDER — KETOROLAC TROMETHAMINE 15 MG/ML IJ SOLN
15.0000 mg | Freq: Once | INTRAMUSCULAR | Status: AC
  Administered 2018-02-24: 15 mg via INTRAVENOUS
  Filled 2018-02-24: qty 1

## 2018-02-24 MED ORDER — ONDANSETRON 4 MG PO TBDP
ORAL_TABLET | ORAL | Status: AC
  Filled 2018-02-24: qty 1

## 2018-02-24 MED ORDER — LEVETIRACETAM 500 MG PO TABS: 500.0000 mg | ORAL_TABLET | Freq: Two times a day (BID) | ORAL | Status: DC

## 2018-02-24 MED ORDER — CHLORHEXIDINE GLUCONATE CLOTH 2 % EX PADS
6.0000 | MEDICATED_PAD | Freq: Every day | CUTANEOUS | Status: DC
  Administered 2018-02-24 – 2018-02-28 (×4): 6 via TOPICAL

## 2018-02-24 MED ORDER — ARGATROBAN 50 MG/50ML IV SOLN
0.6000 ug/kg/min | INTRAVENOUS | Status: DC
  Administered 2018-02-24 – 2018-02-25 (×4): 1 ug/kg/min via INTRAVENOUS
  Filled 2018-02-24 (×5): qty 50

## 2018-02-24 MED ORDER — HYDRALAZINE HCL 50 MG PO TABS
75.0000 mg | ORAL_TABLET | Freq: Three times a day (TID) | ORAL | Status: DC
  Administered 2018-02-24 – 2018-02-28 (×10): 75 mg via ORAL
  Filled 2018-02-24 (×12): qty 1

## 2018-02-24 MED ORDER — ONDANSETRON 4 MG PO TBDP
4.0000 mg | ORAL_TABLET | Freq: Three times a day (TID) | ORAL | Status: DC | PRN
Start: 2018-02-24 — End: 2018-02-28
  Administered 2018-02-24: 4 mg via ORAL

## 2018-02-24 NOTE — Progress Notes (Signed)
PT Cancellation Note  Patient Details Name: Jeffrey [name] MRN: [id number] DOB: [birth year]   Cancelled Treatment:    Reason Eval/Treat Not Completed: Medical issues which prohibited therapy  Pt reports he is not feeling well today and does not want to get up with PT. Requesting for PT to come back tomorrow. Will follow up as schedule allows.   Leighton Ruff, PT, DPT  Acute Rehabilitation Services  Pager: [phone]   Rudean Hitt 02/24/2018, 10:51 AM

## 2018-02-24 NOTE — Progress Notes (Signed)
ANTICOAGULATION CONSULT NOTE - Initial Consult  Pharmacy Consult for argatroban Indication: Suspected  HIT  Allergies  Allergen Reactions  . Lisinopril Anaphylaxis and Shortness Of Breath    Throat swelling  . Heparin     Possible HIT-  . Penicillins Other (See Comments)    Childhood allergy Has patient had a PCN reaction causing immediate rash, facial/tongue/throat swelling, SOB or lightheadedness with hypotension: Unk Has patient had a PCN reaction causing severe rash involving mucus membranes or skin necrosis: Unk Has patient had a PCN reaction that required hospitalization: Unk Has patient had a PCN reaction occurring within the last 10 years: No If all of the above answers are "NO", then may proceed with Cephalosporin use.     Patient Measurements: Weight: 161 lb 2.5 oz (73.1 kg)  Dosing Weight:73kg  Vital Signs: Temp: 98 F (36.7 C) (06/06 0348) Temp Source: Oral (06/06 0348) BP: 119/63 (06/06 1200) Pulse Rate: 71 (06/06 1200)  Labs: Recent Labs    02/21/18 1947 02/22/18 0134 02/22/18 0351 02/22/18 1128 02/22/18 1143 02/23/18 0800  HGB 10.5*  --  9.5*  --  8.4* 8.5*  HCT 31.0*  --  28.9*  --  25.3* 25.9*  PLT  --   --  111*  --  95* 76*  APTT  --   --   --  41*  --   --   LABPROT  --   --   --  17.1*  --   --   INR  --   --   --  1.41  --   --   CREATININE >18.00* 21.55*  --   --   --  12.92*    Estimated Creatinine Clearance: 6.1 mL/min (A) (by C-G formula based on SCr of 12.92 mg/dL (H)).   Medical History: Past Medical History:  Diagnosis Date  . Anemia   . Anxiety   . Arthritis   . Asthma    per pt hx  . End stage renal disease on home HD 07/10/2011   Started HD in September 2012 at [facility] with a tunneled HD catheter, now on home HD with NxtStage. Dialyzing through AVF L lower arm with buttonhole technique as of mid 2014. His brother does the HD treatments at home.  They are roommates for 23 years.  The brother works 3rd shift and gets off about  8am and then puts Mr Heinlen on HD in the morning after getting home. Most of the time he does HD about 4 times a week, for about 4 hours per treatment. Cause of ESRD was HTN according to patient. He says he let his health go and ending up with complications, and that he didn't like seeing doctors in those days.  He says he was diagnosed with severe HTN when he lived in [place] in his 52's.   . Hepatitis B carrier ([place])   . HIV infection ([place])   . Hypertension   . Hyperthyroidism    normal now  . Pneumonia several yrs ago  . Seizure ([place])   . Seizures ([place]) 06/02/2011   x 1 none since  . Thrombocytopenia [facility])     Assessment: 62 yo male with suspected HIT (4T score ~4 (intermediate probability  with onset <5 days, no thrombosis noted, and no other cause identified). MD wishes to proceed with argatroban. Heparin antibody test pending.     Goal of Therapy:  APTT 50-90 sec Monitor platelets by anticoagulation protocol: Yes   Plan:  Start  argatroban drip at 1 mcg/kg/min APTT in 2 hours after start Daily aPTT  Monitor for s/sx of bleeding   Breeanna Galgano A. Levada Dy, PharmD, [place] Pager: [phone]  02/24/2018,3:14 PM

## 2018-02-24 NOTE — Progress Notes (Signed)
ANTICOAGULATION CONSULT NOTE - Follow Up Consult  Pharmacy Consult for argatroban Indication: Suspected  HIT  Allergies  Allergen Reactions  . Lisinopril Anaphylaxis and Shortness Of Breath    Throat swelling  . Heparin     Possible HIT-  . Penicillins Other (See Comments)    Childhood allergy Has patient had a PCN reaction causing immediate rash, facial/tongue/throat swelling, SOB or lightheadedness with hypotension: Unk Has patient had a PCN reaction causing severe rash involving mucus membranes or skin necrosis: Unk Has patient had a PCN reaction that required hospitalization: Unk Has patient had a PCN reaction occurring within the last 10 years: No If all of the above answers are "NO", then may proceed with Cephalosporin use.     Patient Measurements: Weight: 161 lb 2.5 oz (73.1 kg)  Dosing Weight:73kg  Vital Signs: Temp: 97.5 F (36.4 C) (06/06 2004) Temp Source: Oral (06/06 2004) BP: 120/66 (06/06 2004) Pulse Rate: 72 (06/06 2004)  Labs: Recent Labs    02/22/18 0134  02/22/18 1128 02/22/18 1143 02/23/18 0800 02/24/18 1450 02/24/18 1920  HGB  --    < >  --  8.4* 8.5* 7.5*  --   HCT  --    < >  --  25.3* 25.9* 23.4*  --   PLT  --    < >  --  95* 76* 78*  --   APTT  --   --  41*  --   --   --  74*  LABPROT  --   --  17.1*  --   --   --   --   INR  --   --  1.41  --   --   --   --   CREATININE 21.55*  --   --   --  12.92*  --   --    < > = values in this interval not displayed.    Estimated Creatinine Clearance: 6.1 mL/min (A) (by C-G formula based on SCr of 12.92 mg/dL (H)).   Medical History: Past Medical History:  Diagnosis Date  . Anemia   . Anxiety   . Arthritis   . Asthma    per pt hx  . End stage renal disease on home HD 07/10/2011   Started HD in September 2012 at [facility] with a tunneled HD catheter, now on home HD with NxtStage. Dialyzing through AVF L lower arm with buttonhole technique as of mid 2014. His brother does the HD treatments at home.   They are roommates for 23 years.  The brother works 3rd shift and gets off about 8am and then puts Mr Jeffrey [name] on HD in the morning after getting home. Most of the time he does HD about 4 times a week, for about 4 hours per treatment. Cause of ESRD was HTN according to patient. He says he let his health go and ending up with complications, and that he didn't like seeing doctors in those days.  He says he was diagnosed with severe HTN when he lived in [place] in his 15's.   . Hepatitis B carrier ([place])   . HIV infection ([place])   . Hypertension   . Hyperthyroidism    normal now  . Pneumonia several yrs ago  . Seizure ([place])   . Seizures ([place]) 06/02/2011   x 1 none since  . Thrombocytopenia [facility])     Assessment: 62 yo male with suspected HIT (4T score ~4 intermediate probability  with onset <5  days, no thrombosis noted, and no other cause identified). MD wishes to proceed with argatroban. Heparin antibody test pending.    aPTT is therapeutic on argatroban at 49mcg/kg/min (4.4 ml/hr)  Goal of Therapy:  APTT 50-90 sec Monitor platelets by anticoagulation protocol: Yes   Plan:  Continue argatroban drip at 1 mcg/kg/min APTT in 2 hours for confirmation Daily aPTT  Monitor for s/sx of bleeding   [employer], Pharm.D., BCPS Clinical Pharmacist 02/24/2018 8:27 PM

## 2018-02-24 NOTE — Progress Notes (Signed)
NO further bleeding to previous IJ site when Thrombi pad / pressure dressing applied. Pt resting quietly with no distress. Dressing remained intact.

## 2018-02-24 NOTE — Care Management Note (Signed)
Case Management Note  Patient Details  Name: Jeffrey [name] MRN: [id number] Date of Birth: [birth year]  Subjective/Objective:       Pt admitted with seizures. He is from home. He is ESRD and does home HD.             Action/Plan: Awaiting PT eval. Pt refused to work with PT today. Plan is for home with continued home HD. Pt will need to be Clipped to an outpt HD center if rehab is needed at d/c. CM following.   Expected Discharge Date:                  Expected Discharge Plan:  Home/Self Care  In-House Referral:     Discharge planning Services     Post Acute Care Choice:    Choice offered to:     DME Arranged:    DME Agency:     HH Arranged:    HH Agency:     Status of Service:  In process, will continue to follow  If discussed at Long Length of Stay Meetings, dates discussed:    Additional Comments:  Pollie Friar, RN 02/24/2018, 1:36 PM

## 2018-02-24 NOTE — Discharge Summary (Signed)
Name: Jeffrey [name] MRN: [id number] DOB: 03/31/1956 62 y.o. PCP: Velna Ochs, MD  Date of Admission: 02/21/2018  7:04 PM Date of Discharge: 02/28/2018 Attending Physician: Dr. Beryle Beams   Discharge Diagnosis: Principal Problem:   Seizure [facility]) Active Problems:   HIV disease ([place])   End stage renal disease on home HD   Essential hypertension   Anemia of chronic kidney failure, unspecified stage   Mass of duodenum   Lactic acidosis   Acute uremia   Malnutrition of moderate degree   Discharge Medications: Allergies as of 02/28/2018      Reactions   Lisinopril Anaphylaxis, Shortness Of Breath   Throat swelling   Penicillins Other (See Comments)   Childhood allergy Has patient had a PCN reaction causing immediate rash, facial/tongue/throat swelling, SOB or lightheadedness with hypotension: Unk Has patient had a PCN reaction causing severe rash involving mucus membranes or skin necrosis: Unk Has patient had a PCN reaction that required hospitalization: Unk Has patient had a PCN reaction occurring within the last 10 years: No If all of the above answers are "NO", then may proceed with Cephalosporin use.      Medication List    TAKE these medications   albuterol (2.5 MG/3ML) 0.083% nebulizer solution Commonly known as:  PROVENTIL Take 2.5 mg by nebulization every 6 (six) hours as needed for wheezing or shortness of breath.   ALPRAZolam 0.25 MG tablet Commonly known as:  XANAX Take 0.25 mg by mouth 3 (three) times daily as needed.   aspirin 81 MG chewable tablet Chew 1 tablet (81 mg total) by mouth daily.   bictegravir-emtricitabine-tenofovir AF 50-200-25 MG Tabs tablet Commonly known as:  BIKTARVY Take 1 tablet by mouth daily.   calcium acetate 667 MG capsule Commonly known as:  PHOSLO Take 667-2,001 mg by mouth See admin instructions. 1,334-2,001 mg three times a day with each meal and 667-1,334 mg with each snack   carvedilol 25 MG tablet Commonly known as:   COREG Take 1 tablet (25 mg total) by mouth 2 (two) times daily with a meal.   cloNIDine 0.1 mg/24hr patch Commonly known as:  CATAPRES - Dosed in mg/24 hr UNWRAP AND APPLY 1 PATCH TO SKIN EVERY 7 DAYS   clotrimazole-betamethasone cream Commonly known as:  LOTRISONE Apply 1 application topically 2 (two) times daily. What changed:    when to take this  reasons to take this   doxazosin 4 MG tablet Commonly known as:  CARDURA Take 4 mg by mouth at bedtime.   epoetin alfa 2000 UNIT/ML injection Commonly known as:  EPOGEN,PROCRIT Inject 2,000 Units into the vein 4 (four) times a week.   ethyl chloride spray Apply 1 application topically daily as needed (HD).   heparin 1000 UNIT/ML injection Inject 3,000 Units into the vein 4 (four) times a week.   hydrALAZINE 25 MG tablet Commonly known as:  APRESOLINE Take 3 tablets (75 mg total) by mouth every 8 (eight) hours. What changed:    medication strength  how much to take  when to take this   iron sucrose 20 MG/ML injection Commonly known as:  VENOFER Inject 100 mg into the vein once a week. On Monday   megestrol 40 MG tablet Commonly known as:  MEGACE Take 2 tablets (80 mg total) by mouth 2 (two) times daily.   multivitamin Tabs tablet Take 1 tablet by mouth daily.   omeprazole 20 MG capsule Commonly known as:  PRILOSEC Take 1 capsule (20 mg total) by mouth  daily.   traZODone 50 MG tablet Commonly known as:  DESYREL TAKE 1 TABLET(50 MG) BY MOUTH AT BEDTIME AS NEEDED FOR SLEEP   VOLTAREN 1 % Gel Generic drug:  diclofenac sodium APPLY 2 GRAMS EXTERNALLY TO THE AFFECTED AREA FOUR TIMES DAILY What changed:    how much to take  how to take this  when to take this  reasons to take this  additional instructions       Disposition and follow-up:   Jeffrey [name] was discharged from [facility] in Stable condition.  At the hospital follow up visit please address:  1.  Intraluminal distal  duodenal mass: Patient must follow-up with a tertiary GI center such as [facility] or less preferably [facility] for potential removal of the mass. Oncological surgery her at [facility] clearly informed our team that this was the appropriate choice.      Continue home HD: Patient became weak and possible shortened his sessions leading to what we believe was a metabolic derangement that induced his seizure.       Malnourishment: Placed on Megace to boost diet. Not sure this helped. Please assess.       HTN: Altered regimen due to failure of his BP to tolerate higher levels of hydralazine. Please adjust as indicated.   2.  Labs / imaging needed at time of follow-up: n/a  3.  Pending labs/ test needing follow-up: n/a  Follow-up Appointments:   Hospital Course by problem list: Principal Problem:   Seizure ([place]) Active Problems:   HIV disease ([place])   End stage renal disease on home HD   Essential hypertension   Anemia of chronic kidney failure, unspecified stage   Mass of duodenum   Lactic acidosis   Acute uremia   Malnutrition of moderate degree   1. Jeffrey [name] is a 62 year old male with a PMHx notable for ESRD on home HD,priorseizurein 2012, HTN, HIV w/ undetectable most recent viral load, and anemia who was noted to have rhythmic muscle contractions and acute encephalopathyat home. This developed into a generalized tonic clonic seizure and started on heparin.This is thought to be secondary to metabolic derangement from insufficient dialysis in the setting of ESRD. Patient previously presented with aseizure in 2012 and was diagnosed with ESRD as the inciting etiology prior to beingplaced on dialysis at that time. As per brother this event closely resembles his initial presenting seizure and subsequent renal failure diagnosis. He improved notably with hemodialysis following a prolonged post ictal/acute encephalopathic state.Patient underwent multiple rounds ofsuccessfulhemodialysisbut continued to  progressively weaken due to his overall status.He was initially started on keppra which was ultimately discontinued as he developed thrombocytopenia and had a negative HIT antibody test leading to the most likely culprit as the Keppra. He was placed on Megace in an attempt to improve his functional statusas well as to consult the dietician to assist with the process. The patient was determined to be stable for discharge by day 7 following symptomatic improvement in his weakness. He will need to follow with GI at a tertiary care center who can perform the intraluminal mass resection.   Discharge Vitals:   BP 134/72 (BP Location: Right Arm)   Pulse 77   Temp 97.8 F (36.6 C) (Oral)   Resp 18   Wt 165 lb 5.5 oz (75 kg)   SpO2 100%   BMI 22.42 kg/m   Pertinent Labs, Studies, and Procedures:  CBC Latest Ref Rng & Units 02/28/2018 02/27/2018 02/26/2018  WBC 4.0 - 10.5 K/uL 5.0 5.1 5.1  Hemoglobin 13.0 - 17.0 g/dL 8.5(L) 8.6(L) 9.2(L)  Hematocrit 39.0 - 52.0 % 26.3(L) 27.2(L) 28.9(L)  Platelets 150 - 400 K/uL 95(L) 95(L) 79(L)   CMP Latest Ref Rng & Units 02/28/2018 02/27/2018 02/26/2018  Glucose 65 - 99 mg/dL 102(H) 105(H) 107(H)  BUN 6 - 20 mg/dL 73(H) 52(H) 32(H)  Creatinine 0.61 - 1.24 mg/dL 11.74(H) 9.57(H) 7.28(H)  Sodium 135 - 145 mmol/L 133(L) 132(L) 137  Potassium 3.5 - 5.1 mmol/L 3.4(L) 3.1(L) 2.9(L)  Chloride 101 - 111 mmol/L 95(L) 95(L) 96(L)  CO2 22 - 32 mmol/L 25 26 30   Calcium 8.9 - 10.3 mg/dL 8.2(L) 7.9(L) 8.1(L)  Total Protein 6.5 - 8.1 g/dL - - -  Total Bilirubin 0.3 - 1.2 mg/dL - - -  Alkaline Phos 38 - 126 U/L - - -  AST 15 - 41 U/L - - -  ALT 17 - 63 U/L - - -   Discharge Instructions: Discharge Instructions    Diet - low sodium heart healthy   Complete by:  As directed    Diet - low sodium heart healthy   Complete by:  As directed    Increase activity slowly   Complete by:  As directed    Increase activity slowly   Complete by:  As directed        Signed: Kathi Ludwig, MD 03/01/2018, 3:17 PM   Pager: Pager# [phone]

## 2018-02-24 NOTE — Progress Notes (Signed)
Medicine attending: I examined this patient today together with resident physician Dr. Kathi Ludwig and I concur with his evaluation and management plan. Neurologic status remains stable and back to his baseline. Right IJ vascular catheter has been removed.  Wound dressing is dry.  No further bleeding. No further seizure activity since admission and subsequent to resuming dialysis. Neurology advises continuing Keppra at this time.  We will transition from IV to p.o. now that the patient is alert.  Note no focal abnormalities on EEG done June 4. Hemoglobin remains low but stable.  Platelet count has fallen 50% since admission. We will need to hold heparin pending further evaluation.  Currently getting 5000 units subcutaneous 3 times daily plus whatever heparin is given on dialysis.  I would personally also favor stopping the Keppra.  This is second isolated seizure in 7 years and both times related to uremia. Hospitalization still required in view of falling platelet count and possible medication reaction.

## 2018-02-24 NOTE — Progress Notes (Signed)
Subjective: The patient was lying in his bed today upon entering the room. He stated that he felt much improved, denied pain, fever, chills, nausea, vomiting, diarrhea but continues to endorse generalized weakness. He is in agreement with attempting ambulation today and possible discharge home if able to walk and tolerate oral meds.   Objective:  Vital signs in last 24 hours: Vitals:   02/23/18 2221 02/24/18 0006 02/24/18 0348 02/24/18 0532  BP: (!) 158/88 123/69 (!) 158/80 (!) 141/75  Pulse:  89 86 82  Resp:  18 16 18   Temp:  98.3 F (36.8 C) 98 F (36.7 C)   TempSrc:  Oral Oral   SpO2:  100% 99% 99%  Weight:   161 lb 2.5 oz (73.1 kg)    Physical Exam  Constitutional: He appears well-developed and well-nourished. No distress.  HENT:  Head: Normocephalic and atraumatic.  Pressure bandage appears clean and dry.   Cardiovascular: Normal rate and regular rhythm.  No murmur heard. Pulmonary/Chest: Effort normal and breath sounds normal. No stridor. No respiratory distress.  Abdominal: Soft. He exhibits no distension.  Musculoskeletal: He exhibits no edema.  Neurological: He is alert.  Skin: He is not diaphoretic.  Vitals reviewed.  Assessment/Plan:  Principal Problem:   Seizure ([place]) Active Problems:   HIV disease ([place])   End stage renal disease on home HD   Essential hypertension   Anemia of chronic kidney failure, unspecified stage   Mass of duodenum   Lactic acidosis   Acute uremia   Malnutrition of moderate degree  Assessment: Sartaj Hoskin is a 62 year old male with a PMHx notable for ESRD on home HD, prior seizure in 2012, HTN, HIV w/ undetectable most recent viral load, and anemia who was noted to have rhythmic muscle contractions and acute encephalopathy.  This is thought to be secondary to metabolic derangement from insufficient dialysis in the setting of ESRD.  Patient previously presented with a seizure in 2012 and was diagnosed with ESRD as the inciting  etiology prior to being placed on dialysis at that time.  As per brother this event closely resembles his initial presenting seizure and subsequent renal failure diagnosis.  He improved notably with hemodialysis following a prolonged post ictal/acute encephalopathic state.  Patient underwent multiple rounds of successful hemodialysis and was advised to continue his home HD upon discharge.  Plan: Seizure: Prior seizure occurred at the time he was diagnosed with ESRD in 2012 as per brother. I currently feel that his presentation for this admission is related to his uremic state and general metabolic derangements with no other clear etiology identified but am unable to delineate a definitive cause. Brain CT/MRI unremarkable and infectious evaluation unrevealing at this time with notable improvement following HD.  -Call placed to neurology. Call returned By Shanon Brow the PA who advised continuing [facility] at renal dosing and follow-up with Neurology as an outpatient upon discharge.  -Discontinue neuro checks -Keppra PO 500mg  Q12 hours starting this PM -Tele transfer today with possible discharge home if he is able to ambulate -Continue HD as per Nephrology -Renal function panel indicating marked improvement in his BUN down from 178 as the peak. -CBC without leukocytosis, patient afebrile  ESRD on Home HD: Nephrology planning an additional HD session if the patient remains hospitalized on 06/07. Given his marked improvement he may be discharged today if he is able to complete the transition to oral meds.  --Nephrology consulted for HD, we appreciate their continued assistance --Renal panel in am  as per Nephro  Generalized weakness: Mildly improved today. Leading up the the admission the patient and brother noted concern for generalized weakness that lead to the incomplete dialysis sessions. As such, I am concerned that this will again occur if a cause is not identified and treated. I feel strongly that his  newly diagnosed small bowel mass may indeed be a malignancy which if positive may be the etiology of his generalized weakness, decreased PO intake causing the incomplete dialysis sessions leading to the seizure.    MRI brain has been ordered to assess for intracranial abnormalities but has yet to be completed. This will be beneficial in assessing for metastatic lesion but given her negative CT/MRI head this is less concerning.   HTN: Severely elevated the prior day and on admission. Patient had not been completing full HD sessions at home due to weakness nor had he been taking all of his home medications. He was not given his PRN hydralazine by the ED staff as directed resulting in continued elevation of his BP. Improved markedly following HD and with his home meds continues.  --Continue HD  --Continue Clonidine patch --Continue home at Hydralazine 75mg  Q8 hours --Continue carvedilol 25mg  BID  HIV: Last viral load undetectable.  --Continued home antivirals  Diet: NPO Code: Full Fluids: None, needs HD Dvt PPX: Heparin during dialysis Dispo: Anticipated discharge in approximatelyhowever many days until he is cleared for discharge possibly today.  Kathi Ludwig, MD 02/24/2018, 6:57 AM Pager: Pager# [phone]

## 2018-02-24 NOTE — Progress Notes (Signed)
Patient ID: Jeffrey [name], male   DOB: Jan 27, 1956, 62 y.o.   MRN: [id number] [facility] Progress Note   Assessment/ Plan:   1.  Seizures: From available data, likely secondary to lowered seizure threshold in the setting of uremia possibly from poor adherence with described hemodialysis treatments as evidenced by admission labs. 2. ESRD: On home hemodialysis with evidence of poor adherence with prescribed dialysis-he expresses awareness and insists that he was doing all his treatments while inquiring whether he needs to do more days in a week.  He is euvolemic at this time and without any acute dialysis needs.  Will prescribe for dialysis tomorrow if he is still here. 3. Anemia: Secondary to anemia of chronic kidney disease/ESRD-iron studies pending.  Resumed ESA. 4. CKD-MBD: Resume phosphorus binders/continue renal diet. 5.  HIV infection: Continue antiretroviral therapy. 6. Hypertension: Blood pressure acceptable on current regimen, monitor with ultrafiltration/hemodialysis.  Subjective:   Some problems noted overnight with bleeding from hemodialysis catheter removal site-controlled by pressure/thrombin pad.   Objective:   BP (!) 141/75 (BP Location: Right Arm)   Pulse 82   Temp 98 F (36.7 C) (Oral)   Resp 18   Wt 73.1 kg (161 lb 2.5 oz)   SpO2 99%   BMI 21.86 kg/m   Physical Exam: Gen: Comfortably resting in bed, brother at bedside CVS: Pulse regular tachycardia, S1 and S2 with ejection systolic murmur Resp: Anteriorly clear to auscultation, no rales/rhonchi Abd: Soft, flat, nontender Ext: No lower extremity edema, left radiocephalic fistula with palpable thrill/dressings intact  Labs: BMET Recent Labs  Lab 02/21/18 1947 02/22/18 0134 02/23/18 0800  NA 131* 132* 136  K 4.4 5.3* 3.9  CL 98* 94* 94*  CO2  --  13* 24  GLUCOSE 115* 131* 82  BUN >140* 178* 85*  CREATININE >18.00* 21.55* 12.92*  CALCIUM  --  7.7* 8.0*  PHOS  --   --  7.5*   CBC Recent Labs   Lab 02/21/18 1917 02/21/18 1947 02/22/18 0351 02/22/18 1143 02/23/18 0800  WBC 7.9  --  9.0 7.5 5.8  NEUTROABS 4.4  --  7.7  --  4.5  HGB 9.4* 10.5* 9.5* 8.4* 8.5*  HCT 29.1* 31.0* 28.9* 25.3* 25.9*  MCV 99.0  --  96.0 94.8 96.6  PLT 152  --  111* 95* 76*   Medications:    . bictegravir-emtricitabine-tenofovir AF  1 tablet Oral Daily  . carvedilol  25 mg Oral BID WC  . Chlorhexidine Gluconate Cloth  6 each Topical Q0600  . Chlorhexidine Gluconate Cloth  6 each Topical Q0600  . [START ON 02/25/2018] cloNIDine  0.1 mg Transdermal Q Fri  . heparin  5,000 Units Subcutaneous Q8H  . hydrALAZINE  50 mg Oral Q8H   Elmarie Shiley, MD 02/24/2018, 7:39 AM

## 2018-02-25 DIAGNOSIS — D696 Thrombocytopenia, unspecified: Secondary | ICD-10-CM

## 2018-02-25 DIAGNOSIS — Z9114 Patient's other noncompliance with medication regimen: Secondary | ICD-10-CM

## 2018-02-25 DIAGNOSIS — Z9115 Patient's noncompliance with renal dialysis: Secondary | ICD-10-CM

## 2018-02-25 LAB — RENAL FUNCTION PANEL
ALBUMIN: 2.7 g/dL — AB (ref 3.5–5.0)
Anion gap: 13 (ref 5–15)
BUN: 65 mg/dL — AB (ref 6–20)
CALCIUM: 7.5 mg/dL — AB (ref 8.9–10.3)
CO2: 22 mmol/L (ref 22–32)
Chloride: 102 mmol/L (ref 101–111)
Creatinine, Ser: 11.3 mg/dL — ABNORMAL HIGH (ref 0.61–1.24)
GFR calc Af Amer: 5 mL/min — ABNORMAL LOW (ref >60)
GFR, EST NON AFRICAN AMERICAN: 4 mL/min — AB (ref >60)
Glucose, Bld: 105 mg/dL — ABNORMAL HIGH (ref 65–99)
PHOSPHORUS: 5.2 mg/dL — AB (ref 2.5–4.6)
POTASSIUM: 3.5 mmol/L (ref 3.5–5.1)
SODIUM: 137 mmol/L (ref 135–145)

## 2018-02-25 LAB — CBC
HCT: 24.2 % — ABNORMAL LOW (ref 39.0–52.0)
HEMOGLOBIN: 7.7 g/dL — AB (ref 13.0–17.0)
MCH: 32.2 pg (ref 26.0–34.0)
MCHC: 31.8 g/dL (ref 30.0–36.0)
MCV: 101.3 fL — ABNORMAL HIGH (ref 78.0–100.0)
Platelets: 67 10*3/uL — ABNORMAL LOW (ref 150–400)
RBC: 2.39 MIL/uL — AB (ref 4.22–5.81)
RDW: 25.1 % — ABNORMAL HIGH (ref 11.5–15.5)
WBC: 5.3 10*3/uL (ref 4.0–10.5)

## 2018-02-25 LAB — GLUCOSE, CAPILLARY
GLUCOSE-CAPILLARY: 134 mg/dL — AB (ref 65–99)
GLUCOSE-CAPILLARY: 87 mg/dL (ref 65–99)
Glucose-Capillary: 106 mg/dL — ABNORMAL HIGH (ref 65–99)
Glucose-Capillary: 121 mg/dL — ABNORMAL HIGH (ref 65–99)

## 2018-02-25 LAB — APTT
aPTT: 82 seconds — ABNORMAL HIGH (ref 24–36)
aPTT: 70 seconds — ABNORMAL HIGH (ref 24–36)

## 2018-02-25 LAB — HEPARIN INDUCED PLATELET AB (HIT ANTIBODY): HEPARIN INDUCED PLT AB: 0.132 {OD_unit} (ref 0.000–0.400)

## 2018-02-25 LAB — PREPARE RBC (CROSSMATCH)

## 2018-02-25 MED ORDER — SODIUM CHLORIDE 0.9 % IV SOLN: Freq: Once | INTRAVENOUS | Status: DC

## 2018-02-25 NOTE — Care Management Important Message (Signed)
Important Message  Patient Details  Name: Jeffrey [name] MRN: [id number] Date of Birth: [birth year]   Medicare Important Message Given:  Yes    [name] 02/25/2018, 3:44 PM

## 2018-02-25 NOTE — Progress Notes (Addendum)
Subjective: The patient was resting in his bed today upon entering the room. He stated that he was feeling minimally improved as compared to the prior day but overall continues to feel unwell. He is generally weak. We explained that we believe the weakness is due to a combination of factors including his probable abdominal tumor, lack of sufficient PO intake, ESRD on HD. He is in agreement with considering having a surgical evaluation of his case.  Objective:  Vital signs in last 24 hours: Vitals:   02/24/18 2200 02/24/18 2329 02/25/18 0444 02/25/18 0555  BP:  (!) 117/53 (!) 152/69   Pulse:  72 67   Resp: 14  16 19   Temp:   98.7 F (37.1 C)   TempSrc:   Oral   SpO2:  100% 100%   Weight:    166 lb 7.2 oz (75.5 kg)   Physical Exam  Constitutional: He appears well-developed and well-nourished. No distress.  HENT:  Head: Normocephalic and atraumatic.  Cardiovascular: Normal rate and regular rhythm.  Murmur ([place]) heard. Pulmonary/Chest: Effort normal. No stridor. He has no wheezes.  Neurological: He is alert.  Skin: Skin is warm. He is not diaphoretic.  Psychiatric: He has a normal mood and affect.   Assessment/Plan:  Principal Problem:   Seizure ([place]) Active Problems:   HIV disease ([place])   End stage renal disease on home HD   Essential hypertension   Anemia of chronic kidney failure, unspecified stage   Mass of duodenum   Lactic acidosis   Acute uremia   Malnutrition of moderate degree  Assessment: Jeffrey [name] is a 62 year old male with a PMHx notable for ESRD on home HD,priorseizurein 2012, HTN, HIV w/ undetectable most recent viral load, and anemia who was noted to have rhythmic muscle contractions and acute encephalopathy at home. This developed into a generalized tonic clonic seizure and started on heparin.This is thought to be secondary to metabolic derangement from insufficient dialysis in the setting of ESRD. Patient previously presented with aseizure in  2012 and was diagnosed with ESRD as the inciting etiology prior to beingplaced on dialysis at that time. As per brother this event closely resembles his initial presenting seizure and subsequent renal failure diagnosis. He improved notably with hemodialysis following a prolonged post ictal/acute encephalopathic state.Patient underwent multiple rounds of successful hemodialysis but continued to progressively weaken due to his overall status.   Plan: Seizure: Prior seizure occurred at the time he was diagnosed with ESRD in 2012 as per brother. I currently feel that his presentation for this admission is related to his uremic state and general metabolic derangements with no other clear etiology identified but am unable to delineate a definitive cause. Brain CT/MRI unremarkable and infectious evaluation unrevealing at this timewith notable improvement following HD.  -Discontinue neuro checks -Keppra PO 500mg  Q12 hours starting this PM but discontinued due to potential to cause HIT -Tele transfer today with possible discharge home if he is able to ambulate -ContinueHD as per Nephrology -Renal function panel indicating marked improvement in his BUN down from 178 as the peak. -CBCwithout leukocytosis,patient afebrile -Daily CBC  Thrombocytopenia: Platelet count dropped from 152 to 68K by day four of admission. This is somewhat concerning for HIT vs other medication induced thrombocytopenia. We will continue to monitor this.  --Discontinue Heparin  --Heparin induced platelet antibody test ordered --Daily CBC --Started argatroban per pharmacy dosing  ESRD on Home HD: Continue HD as per nephrology.  --Nephrology consulted for HD, we appreciate  their continued assistance --Renal panel in am as per Nephro  Generalized weakness: Mildly improved today. Leading up the the admission the patient and brother noted concern for generalized weakness that lead to the incomplete dialysis sessions. As  such, I am concerned that this will again occur if a cause is not identified and treated. I feel strongly that his newly diagnosed small bowel mass may indeed be a malignancy which if positive may be the etiology of his generalized weakness, decreased PO intake causing the incomplete dialysis sessions leading to the seizure.  MRI brain has been ordered to assess for intracranial abnormalities but has yet to be completed. This will be beneficial in assessing for metastatic lesion but given her negative CT/MRI head this is less concerning. --We will consulted Dr Barry Dienes for consideration of a possible intraabdominal surgery. I do not feel the patient is physically able to endure surgery at this time but would like their evaluation as I do not feel he will improve without removal of the tumor.  Addendum: Call returned by Dr. Barry Dienes who pleasantly informed our team that the plan for endoscopic removal/evaluation of the duodenal mass is appropriate and that he is not currently a surgical candidate given his overall disposition.   HTN: Severely elevatedthe priorday and on admission. Patient hadnot been completing fullHD sessionsat home due to weakness nor had he been taking all of his home medications.He was not given his PRN hydralazine by the ED staff as directed resulting in continued elevation of his BP. Improved markedly following HD and with his home meds continues.  --ContinueHD  --ContinueClonidine patch --Continue home atHydralazine 75mg Q8 hours --Continue carvedilol 25mg  BID  HIV: Last viral load undetectable.  --Continuedhome antivirals  Diet: NPO Code: Full Fluids: None, needs HD Dvt PPX: Argatroban  Dispo: Anticipated discharge in approximatelyhowever many days until he is cleared for discharge possibly today.  Kathi Ludwig, MD 02/25/2018, 7:02 AM Pager: Pager# [phone]

## 2018-02-25 NOTE — Progress Notes (Signed)
Physical Therapy Treatment Patient Details Name: Jeffrey [name] MRN: [id number] DOB: 01-Feb-1956 Today's Date: 02/25/2018    History of Present Illness Patient is a 62 y/o male admitted on 02/21/18 due to generalized weakness and abnormal L arm movement. Patient also with generalized tonic-clonic seizure in ED. Patient with a PMH significant for ESRD on HD, HTN, HIV, and prior seizures. Head CT revealing chronic small vessel disease and parenchymal loss, but with no acute abnormalities.    PT Comments    Mr. Gaymon is a pleasant 62 y/o male admitted with the above listed diagnosis. Prior to admission patient lived with his brother, who provided assistance, but able to perform functional mobility and light household chores. Today able to come to EOB with only SUP for safety, however patient declining further transfers and gait due to fatigue. Patient and brother both express desire to return home rather than short term rehab and receive [place] services. PT to continue to follow acutely to maximize functional mobility prior to d/c.    Follow Up Recommendations  Home health PT;Supervision/Assistance - 24 hour     Equipment Recommendations  Other (comment)(will continue to assess)    Recommendations for Other Services OT consult     Precautions / Restrictions Precautions Precautions: Fall Restrictions Weight Bearing Restrictions: No    Mobility  Bed Mobility Overal bed mobility: Needs Assistance Bed Mobility: Supine to Sit;Sit to Supine     Supine to sit: Supervision Sit to supine: Supervision   General bed mobility comments: SUP for safety and line management  Transfers                 General transfer comment: able to come to EOB - was going to attempt sit to stand, however patient reclining due to fatigue  Ambulation/Gait                 Stairs             Wheelchair Mobility    Modified Rankin (Stroke Patients Only)       Balance Overall balance  assessment: Needs assistance Sitting-balance support: No upper extremity supported;Feet supported Sitting balance-Leahy Scale: Fair                                      Cognition Arousal/Alertness: Awake/alert;Lethargic Behavior During Therapy: WFL for tasks assessed/performed Overall Cognitive Status: Within Functional Limits for tasks assessed                                 General Comments: able to participate in normal conversation without needing redirection      Exercises      General Comments        Pertinent Vitals/Pain Pain Assessment: No/denies pain    Home Living Family/patient expects to be discharged to:: Private residence Living Arrangements: Other relatives(brother) Available Help at Discharge: Family Type of Home: House Home Access: Stairs to enter   Home Layout: Able to live on main level with bedroom/bathroom Home Equipment: Walker - 2 wheels Additional Comments: Home Hemodialisis    Prior Function Level of Independence: Independent with assistive device(s)      Comments: patient reports he is retired and was able to perform mobility, ADLs, and light household chores without issue   PT Goals (current goals can now be found in the care plan section) Acute  Rehab PT Goals Patient Stated Goal: improve mobility PT Goal Formulation: With patient Time For Goal Achievement: 03/11/18 Potential to Achieve Goals: Fair    Frequency    Min 3X/week      PT Plan      Co-evaluation              AM-PAC PT "6 Clicks" Daily Activity  Outcome Measure  Difficulty turning over in bed (including adjusting bedclothes, sheets and blankets)?: A Little Difficulty moving from lying on back to sitting on the side of the bed? : A Little Difficulty sitting down on and standing up from a chair with arms (e.g., wheelchair, bedside commode, etc,.)?: Unable Help needed moving to and from a bed to chair (including a wheelchair)?: A  Lot Help needed walking in hospital room?: A Lot Help needed climbing 3-5 steps with a railing? : A Lot 6 Click Score: 13    End of Session   Activity Tolerance: Patient limited by fatigue Patient left: in bed;with call bell/phone within reach;with bed alarm set;with family/visitor present Nurse Communication: Mobility status PT Visit Diagnosis: Unsteadiness on feet (R26.81);Other abnormalities of gait and mobility (R26.89);Muscle weakness (generalized) (M62.81)     Time: 1188-6773 PT Time Calculation (min) (ACUTE ONLY): 23 min  Charges:                       G Codes:       Lanney Gins, PT, DPT 02/25/18 2:05 PM

## 2018-02-25 NOTE — Progress Notes (Signed)
ANTICOAGULATION CONSULT NOTE - Follow Up Consult  Pharmacy Consult for Argatroban Indication: Suspected HIT  Allergies  Allergen Reactions  . Lisinopril Anaphylaxis and Shortness Of Breath    Throat swelling  . Heparin     Possible HIT-  . Penicillins Other (See Comments)    Childhood allergy Has patient had a PCN reaction causing immediate rash, facial/tongue/throat swelling, SOB or lightheadedness with hypotension: Unk Has patient had a PCN reaction causing severe rash involving mucus membranes or skin necrosis: Unk Has patient had a PCN reaction that required hospitalization: Unk Has patient had a PCN reaction occurring within the last 10 years: No If all of the above answers are "NO", then may proceed with Cephalosporin use.     Patient Measurements: Weight: 161 lb 2.5 oz (73.1 kg)  Dosing Weight:73kg  Vital Signs: Temp: 97.5 F (36.4 C) (06/06 2004) Temp Source: Oral (06/06 2004) BP: 117/53 (06/06 2329) Pulse Rate: 72 (06/06 2329)  Labs: Recent Labs    02/22/18 0134  02/22/18 1128 02/22/18 1143 02/23/18 0800 02/24/18 1450 02/24/18 1920 02/25/18 0005  HGB  --    < >  --  8.4* 8.5* 7.5*  --   --   HCT  --    < >  --  25.3* 25.9* 23.4*  --   --   PLT  --    < >  --  95* 76* 78*  --   --   APTT  --   --  41*  --   --   --  74* 82*  LABPROT  --   --  17.1*  --   --   --   --   --   INR  --   --  1.41  --   --   --   --   --   CREATININE 21.55*  --   --   --  12.92*  --   --   --    < > = values in this interval not displayed.    Estimated Creatinine Clearance: 6.1 mL/min (A) (by C-G formula based on SCr of 12.92 mg/dL (H)).   Medical History: Past Medical History:  Diagnosis Date  . Anemia   . Anxiety   . Arthritis   . Asthma    per pt hx  . End stage renal disease on home HD 07/10/2011   Started HD in September 2012 at [facility] with a tunneled HD catheter, now on home HD with NxtStage. Dialyzing through AVF L lower arm with buttonhole technique as of mid  2014. His brother does the HD treatments at home.  They are roommates for 23 years.  The brother works 3rd shift and gets off about 8am and then puts Mr Deskin on HD in the morning after getting home. Most of the time he does HD about 4 times a week, for about 4 hours per treatment. Cause of ESRD was HTN according to patient. He says he let his health go and ending up with complications, and that he didn't like seeing doctors in those days.  He says he was diagnosed with severe HTN when he lived in [place] in his 41's.   . Hepatitis B carrier ([place])   . HIV infection ([place])   . Hypertension   . Hyperthyroidism    normal now  . Pneumonia several yrs ago  . Seizure ([place])   . Seizures ([place]) 06/02/2011   x 1 none since  . 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 MD wishes to proceed with argatroban. Heparin antibody test pending.    aPTT is therapeutic x 2 on argatroban at 41mcg/kg/min (4.4 ml/hr)  Goal of Therapy:  APTT 50-90 secs Monitor platelets by anticoagulation protocol: Yes   Plan:  Continue argatroban drip at 1 mcg/kg/min Daily aPTT/CBC Monitor for s/sx of bleeding  Narda Bonds, PharmD, BCPS Clinical Pharmacist Phone: [phone]

## 2018-02-25 NOTE — Procedures (Signed)
Patient seen on Hemodialysis- appears to be comfortable. QB 400, UF goal 2L. Holding heparin with dropping platelet counts, no bleeding diathesis.  Jeffrey Shiley MD [facility]. Office # [phone] Pager # [phone] 9:23 AM

## 2018-02-25 NOTE — Progress Notes (Signed)
Medicine attending: I examined this patient today together with resident physician Dr. Kathi Ludwig and I concur with his evaluation and management plan. Patient is awake alert and interactive.  No acute changes on physical exam.  Hemoglobin has drifted down to 7.7 and he will receive a 1 unit packed red cell transfusion. Platelet count slightly lower at 67,000 compared with 78,000 yesterday.  Argatroban started pending results of a heparin associated thrombocytopenia panel.  Keppra stopped as another potential etiology of the acute fall and his platelet count. Summary: 62 year old man with end-stage renal disease on dialysis done in his home with the assistance of his brother, HIV disease on medications, who presented on June 3 for evaluation of progressive weakness and had a witnessed seizure in the emergency department.  He was found to be severely uremic with a BUN over the limits of the lab detection and acidemic with an anion gap of 25 and a lactic acid of 7.2, venous blood gas pH 7.1.  Prolonged postictal state lasting for about 24 hours then rapid improvement subsequent to dialysis. Greater than 50% fall in his baseline platelet count while receiving prophylactic unfractionated heparin and Keppra.  Both have been discontinued and currently on a parenteral direct thrombin inhibitor. Additional problem being evaluated his recent findings of a mass in the third portion of his duodenum.  Not stable for any surgical procedure at this time but in view of long-term planning, we would like to get a opinion on whether this lesion can be resected here in [place].

## 2018-02-26 LAB — TYPE AND SCREEN
ABO/RH(D): O NEG
Antibody Screen: NEGATIVE
Unit division: 0

## 2018-02-26 LAB — CBC
HCT: 28.5 % — ABNORMAL LOW (ref 39.0–52.0)
HEMATOCRIT: 28.9 % — AB (ref 39.0–52.0)
HEMOGLOBIN: 9.2 g/dL — AB (ref 13.0–17.0)
Hemoglobin: 9.2 g/dL — ABNORMAL LOW (ref 13.0–17.0)
MCH: 32.1 pg (ref 26.0–34.0)
MCH: 31.7 pg (ref 26.0–34.0)
MCHC: 32.3 g/dL (ref 30.0–36.0)
MCHC: 31.8 g/dL (ref 30.0–36.0)
MCV: 99.3 fL (ref 78.0–100.0)
MCV: 99.7 fL (ref 78.0–100.0)
PLATELETS: 70 10*3/uL — AB (ref 150–400)
Platelets: 79 10*3/uL — ABNORMAL LOW (ref 150–400)
RBC: 2.87 MIL/uL — AB (ref 4.22–5.81)
RBC: 2.9 MIL/uL — ABNORMAL LOW (ref 4.22–5.81)
RDW: 24.6 % — ABNORMAL HIGH (ref 11.5–15.5)
RDW: 24.3 % — AB (ref 11.5–15.5)
WBC: 5.1 10*3/uL (ref 4.0–10.5)
WBC: 5.1 10*3/uL (ref 4.0–10.5)

## 2018-02-26 LAB — RENAL FUNCTION PANEL
ANION GAP: 11 (ref 5–15)
Albumin: 2.8 g/dL — ABNORMAL LOW (ref 3.5–5.0)
BUN: 32 mg/dL — AB (ref 6–20)
CHLORIDE: 96 mmol/L — AB (ref 101–111)
CO2: 30 mmol/L (ref 22–32)
Calcium: 8.1 mg/dL — ABNORMAL LOW (ref 8.9–10.3)
Creatinine, Ser: 7.28 mg/dL — ABNORMAL HIGH (ref 0.61–1.24)
GFR, EST AFRICAN AMERICAN: 8 mL/min — AB (ref >60)
GFR, EST NON AFRICAN AMERICAN: 7 mL/min — AB (ref >60)
Glucose, Bld: 107 mg/dL — ABNORMAL HIGH (ref 65–99)
POTASSIUM: 2.9 mmol/L — AB (ref 3.5–5.1)
Phosphorus: 3.4 mg/dL (ref 2.5–4.6)
Sodium: 137 mmol/L (ref 135–145)

## 2018-02-26 LAB — BPAM RBC
Blood Product Expiration Date: 201906122359
ISSUE DATE / TIME: 201906071020
Unit Type and Rh: 9500

## 2018-02-26 LAB — APTT
APTT: 110 s — AB (ref 24–36)
aPTT: 96 seconds — ABNORMAL HIGH (ref 24–36)

## 2018-02-26 MED ORDER — HEPARIN SODIUM (PORCINE) 1000 UNIT/ML DIALYSIS: 40.0000 [IU]/kg | INTRAMUSCULAR | Status: DC | PRN

## 2018-02-26 MED ORDER — HEPARIN SODIUM (PORCINE) 1000 UNIT/ML DIALYSIS
40.0000 [IU]/kg | INTRAMUSCULAR | Status: DC | PRN
  Filled 2018-02-26: qty 4

## 2018-02-26 MED ORDER — HEPARIN SODIUM (PORCINE) 5000 UNIT/ML IJ SOLN
5000.0000 [IU] | Freq: Three times a day (TID) | INTRAMUSCULAR | Status: DC
  Administered 2018-02-26 – 2018-02-28 (×6): 5000 [IU] via SUBCUTANEOUS
  Filled 2018-02-26 (×6): qty 1

## 2018-02-26 MED ORDER — GLUCERNA SHAKE PO LIQD
237.0000 mL | Freq: Three times a day (TID) | ORAL | Status: DC
  Administered 2018-02-26 – 2018-02-27 (×2): 237 mL via ORAL

## 2018-02-26 MED ORDER — GLUCERNA PO LIQD: 237.0000 mL | Freq: Three times a day (TID) | ORAL | Status: DC

## 2018-02-26 NOTE — Progress Notes (Signed)
Patient ID: Jeffrey [name], male   DOB: [birth year], 62 y.o.   MRN: [id number] [facility] Progress Note   Assessment/ Plan:   1.  Seizures: Likely from lowered seizure threshold in the setting of poor adherence with dialysis/severe azotemia.  Anticonvulsant medications on hold due to thrombocytopenia. 2. ESRD: On home hemodialysis with evidence of poor adherence with prescribed dialysis-while here in the hospital, got hemodialysis on an essentially Monday/Wednesday/Friday schedule with dialysis yesterday.  No acute indications for dialysis today.  Reeducated regarding compliance. 3. Anemia: Secondary to anemia of chronic kidney disease/ESRD-he underwent a PRBC transfusion yesterday with hemodialysis yesterday. 4. CKD-MBD: Resume phosphorus binders/continue renal diet. 5.  HIV infection: Continue antiretroviral therapy. 6. Hypertension: Blood pressure acceptable on current regimen, monitor with ultrafiltration/hemodialysis. 7.  Thrombocytopenia: Converted from heparin to argatroban and HIT panel pending.  Anticonvulsant medications discontinued.  No bleeding diathesis.  Subjective:   Reports to be feeling fair-inquires about when he will go home.    Objective:   BP (!) 145/76   Pulse 81   Temp 99.2 F (37.3 C) (Oral)   Resp 17   Wt 74 kg (163 lb 2.3 oz)   SpO2 100%   BMI 22.13 kg/m   Physical Exam: Gen: Comfortably resting in bed, brother at bedside CVS: Pulse regular tachycardia, S1 and S2 with ejection systolic murmur Resp: Anteriorly clear to auscultation, no rales/rhonchi Abd: Soft, flat, nontender Ext: No lower extremity edema, left radiocephalic fistula with palpable thrill/dressings intact  Labs: BMET Recent Labs  Lab 02/21/18 1947 02/22/18 0134 02/23/18 0800 02/25/18 0819  NA 131* 132* 136 137  K 4.4 5.3* 3.9 3.5  CL 98* 94* 94* 102  CO2  --  13* 24 22  GLUCOSE 115* 131* 82 105*  BUN >140* 178* 85* 65*  CREATININE >18.00* 21.55* 12.92* 11.30*   CALCIUM  --  7.7* 8.0* 7.5*  PHOS  --   --  7.5* 5.2*   CBC Recent Labs  Lab 02/21/18 1917  02/22/18 0351  02/23/18 0800 02/24/18 1450 02/25/18 0509 02/26/18 0335  WBC 7.9  --  9.0   < > 5.8 5.6 5.3 5.1  NEUTROABS 4.4  --  7.7  --  4.5  --   --   --   HGB 9.4*   < > 9.5*   < > 8.5* 7.5* 7.7* 9.2*  HCT 29.1*   < > 28.9*   < > 25.9* 23.4* 24.2* 28.5*  MCV 99.0  --  96.0   < > 96.6 100.0 101.3* 99.3  PLT 152  --  111*   < > 76* 78* 67* 70*   < > = values in this interval not displayed.   Medications:    . bictegravir-emtricitabine-tenofovir AF  1 tablet Oral Daily  . carvedilol  25 mg Oral BID WC  . Chlorhexidine Gluconate Cloth  6 each Topical Q0600  . cloNIDine  0.1 mg Transdermal Q Fri  . hydrALAZINE  75 mg Oral Q8H  . megestrol  80 mg Oral BID   Elmarie Shiley, MD 02/26/2018, 9:18 AM

## 2018-02-26 NOTE — Plan of Care (Signed)
Jeffrey [name] has been in bed today with no seizure activity.  I believe he would benefit from PT/OT assistance the coming week.  He is calm, cooperative and extremely pleasant.  His only complaint was a slight headache, relieved by Tylenol.  His brother has been at bedside all day.

## 2018-02-26 NOTE — Progress Notes (Signed)
Subjective: The patient was resting in his bed today upon entering the room. He continues to endorse generalized weakness but denies acute pain or concerns. His mental status is clear and he was able to discuss various topics without pause. 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 He spoke of his love for music. I agree that he has at times been treated with less than complete respect due to every individual but stated that perhaps there was a misunderstanding and that I was unable to pass judgement on prior events. He agreed to attempt to increase his oral intake with the hope of strengthening himself sufficiently to be discharged home where in turn he can visit [facility] for his outpatient follow-up with GI.  Objective:  Vital signs in last 24 hours: Vitals:   02/26/18 0040 02/26/18 0417 02/26/18 0418 02/26/18 0602  BP:  (!) 114/52  (!) 145/76  Pulse:  76  81  Resp: 19 16 17    Temp:  99.2 F (37.3 C)    TempSrc:  Oral    SpO2:  100%    Weight:       Physical Exam  Constitutional: He appears well-developed and well-nourished. No distress.  HENT:  Head: Normocephalic and atraumatic.  Cardiovascular: Normal rate and regular rhythm.  Murmur heard. Pulmonary/Chest: Effort normal and breath sounds normal. No stridor. No respiratory distress.  Abdominal: Soft. Bowel sounds are normal. He exhibits no distension.  Musculoskeletal: He exhibits no edema or tenderness.  Neurological: He is alert.  Skin: Skin is warm. He is not diaphoretic.  Psychiatric: He has a normal mood and affect.  Vitals reviewed.  Assessment/Plan:  Principal Problem:   Seizure ([place]) Active Problems:   HIV disease ([place])   End stage renal disease on home HD   Essential hypertension   Anemia of chronic kidney failure, unspecified stage   Mass of duodenum   Lactic acidosis   Acute uremia   Malnutrition of moderate  degree  Assessment: Jeffrey [name] is a 62 year old male with a PMHx notable for ESRD on home HD,priorseizurein 2012, HTN, HIV w/ undetectable most recent viral load, and anemia who was noted to have rhythmic muscle contractions and acute encephalopathy at home. This developed into a generalized tonic clonic seizure and started on heparin.This is thought to be secondary to metabolic derangement from insufficient dialysis in the setting of ESRD. Patient previously presented with aseizure in 2012 and was diagnosed with ESRD as the inciting etiology prior to beingplaced on dialysis at that time. As per brother this event closely resembles his initial presenting seizure and subsequent renal failure diagnosis. He improved notably with hemodialysis following a prolonged post ictal/acute encephalopathic state.Patient underwent multiple rounds ofsuccessfulhemodialysisbut continued to progressively weaken due to his overall status. He was placed on Megace in an attempt to improve his functional status as well as consult to the dietician to assist with the process.  Plan: Seizure: A prior seizure occurred at the time he was diagnosed withESRDin 2012 as per brother. I still feel that his presentation for this admission is related to his uremic state and general metabolic derangementswith no other clear etiology identifiedbut have been unable to delineate a definitive cause.BrainCT/MRIwere unremarkable and infectious evaluation unrevealing at this timewith notable improvement following HD.  -Keppra PO 500mg  Q12 hours starting this PM but discontinued due to potential to induce thrombocytopenia -HIT antibody negative, discontinued argatroban and ordered  Heparin per pharmacy -Tele transfer todaywith possible discharge home if he is able to ambulate -ContinueHD as per Nephrology -Renal function panel indicating marked improvement in his BUN down from 178as the peak. -CBCwithout  leukocytosis,patient afebrile  Thrombocytopenia: Platelet count dropped from 152 to 68K by day four of admission. Stable today at 79K after discontinuation This is somewhat concerning for HIT vs other medication induced thrombocytopenia. We will continue to monitor this.  --Discontinue Heparin  --Heparin induced platelet antibody test ordered and low negative no reflex test recommended.  --Daily CBC  ESRD on Home HD: Continue HD as per nephrology. --Nephrology consulted for HD, we appreciate their continued assistance --Renal panel in amas per Nephro  Generalized weakness: Likely due to malignancy induced decreasing oral intake. He will need to increase his oral intake prior to be stabilized for discharge.  --Dietician consult placed --Glucerna ordered --Continue Megace   HTN: Severely elevatedthe priordayand on admission. Patient hadnot been completing fullHD sessionsat home due to weakness nor hadhe been taking all of his home medications.He was not given his PRN hydralazine by the ED staff as directed resulting in continued elevation of his BP. Improvedmarkedlyfollowing HD and with his home meds continues. --ContinueHD  --ContinueClonidine patch --Continue home atHydralazine 75mg Q8 hours --Continue carvedilol 25mg  BID  HIV: Last viral load undetectable.  --Continuedhome antivirals  Diet: NPO Code: Full Fluids: None, needs HD Dvt PPX: Argatroban  Dispo: Anticipated discharge in approximately 1-2 day(s).   Kathi Ludwig, MD 02/26/2018, 10:08 AM Pager: Pager# [phone]

## 2018-02-26 NOTE — Progress Notes (Signed)
ANTICOAGULATION CONSULT NOTE - Follow Up Consult  Pharmacy Consult for Argatroban Indication: Suspected HIT  Allergies  Allergen Reactions  . Lisinopril Anaphylaxis and Shortness Of Breath    Throat swelling  . Heparin     Possible HIT-  . Penicillins Other (See Comments)    Childhood allergy Has patient had a PCN reaction causing immediate rash, facial/tongue/throat swelling, SOB or lightheadedness with hypotension: Unk Has patient had a PCN reaction causing severe rash involving mucus membranes or skin necrosis: Unk Has patient had a PCN reaction that required hospitalization: Unk Has patient had a PCN reaction occurring within the last 10 years: No If all of the above answers are "NO", then may proceed with Cephalosporin use.     Patient Measurements: Weight: 163 lb 2.3 oz (74 kg)  Dosing Weight:73kg  Vital Signs: Temp: 99.2 F (37.3 C) (06/08 0417) Temp Source: Oral (06/08 0417) BP: 145/76 (06/08 0602) Pulse Rate: 81 (06/08 0602)  Labs: Recent Labs    02/23/18 0800 02/24/18 1450  02/25/18 0005 02/25/18 0509 02/25/18 0819 02/26/18 0335  HGB 8.5* 7.5*  --   --  7.7*  --  9.2*  HCT 25.9* 23.4*  --   --  24.2*  --  28.5*  PLT 76* 78*  --   --  67*  --  70*  APTT  --   --    < > 82* 70*  --  110*  CREATININE 12.92*  --   --   --   --  11.30*  --    < > = values in this interval not displayed.    Estimated Creatinine Clearance: 7 mL/min (A) (by C-G formula based on SCr of 11.3 mg/dL (H)).   Medical History: Past Medical History:  Diagnosis Date  . Anemia   . Anxiety   . Arthritis   . Asthma    per pt hx  . End stage renal disease on home HD 07/10/2011   Started HD in September 2012 at [facility] with a tunneled HD catheter, now on home HD with NxtStage. Dialyzing through AVF L lower arm with buttonhole technique as of mid 2014. His brother does the HD treatments at home.  They are roommates for 23 years.  The brother works 3rd shift and gets off about 8am and then  puts Mr Litton on HD in the morning after getting home. Most of the time he does HD about 4 times a week, for about 4 hours per treatment. Cause of ESRD was HTN according to patient. He says he let his health go and ending up with complications, and that he didn't like seeing doctors in those days.  He says he was diagnosed with severe HTN when he lived in [place] in his 67's.   . Hepatitis B carrier ([place])   . HIV infection ([place])   . Hypertension   . Hyperthyroidism    normal now  . Pneumonia several yrs ago  . Seizure ([place])   . Seizures ([place]) 06/02/2011   x 1 none since  . Thrombocytopenia [facility])     Assessment: 62 yo male with suspected HIT (4T score ~4 intermediate probability  with onset <5 days, no thrombosis noted, and no other cause identified). MD wishes to proceed with argatroban. Heparin antibody test pending.    aPTT is supratherapeutic at 110 seconds, H/H up today plts low but stable and no bleeding reported.  Goal of Therapy:  APTT 50-90 secs Monitor platelets by anticoagulation protocol: Yes  Plan:  Decrease argatroban gtt to 0.7 mcg/kg/min F/u 2h aPTT  Daily aPTT/CBC Monitor for s/sx of bleeding  Bertis Ruddy, PharmD Pharmacy Resident [phone] 02/26/2018 7:36 AM

## 2018-02-26 NOTE — Progress Notes (Addendum)
ANTICOAGULATION CONSULT NOTE - Follow Up Consult  Pharmacy Consult for Argatroban Indication: Suspected HIT  Allergies  Allergen Reactions  . Lisinopril Anaphylaxis and Shortness Of Breath    Throat swelling  . Heparin     Possible HIT-  . Penicillins Other (See Comments)    Childhood allergy Has patient had a PCN reaction causing immediate rash, facial/tongue/throat swelling, SOB or lightheadedness with hypotension: Unk Has patient had a PCN reaction causing severe rash involving mucus membranes or skin necrosis: Unk Has patient had a PCN reaction that required hospitalization: Unk Has patient had a PCN reaction occurring within the last 10 years: No If all of the above answers are "NO", then may proceed with Cephalosporin use.     Patient Measurements: Weight: 163 lb 2.3 oz (74 kg)  Dosing Weight:73kg  Vital Signs: Temp: 99.2 F (37.3 C) (06/08 0417) Temp Source: Oral (06/08 0417) BP: 145/76 (06/08 0602) Pulse Rate: 81 (06/08 0602)  Labs: Recent Labs    02/25/18 0509 02/25/18 0819 02/26/18 0335 02/26/18 0927 02/26/18 0930  HGB 7.7*  --  9.2* 9.2*  --   HCT 24.2*  --  28.5* 28.9*  --   PLT 67*  --  70* 79*  --   APTT 70*  --  110* 96*  --   CREATININE  --  11.30*  --   --  7.28*    Estimated Creatinine Clearance: 10.9 mL/min (A) (by C-G formula based on SCr of 7.28 mg/dL (H)).   Medical History: Past Medical History:  Diagnosis Date  . Anemia   . Anxiety   . Arthritis   . Asthma    per pt hx  . End stage renal disease on home HD 07/10/2011   Started HD in September 2012 at [facility] with a tunneled HD catheter, now on home HD with NxtStage. Dialyzing through AVF L lower arm with buttonhole technique as of mid 2014. His brother does the HD treatments at home.  They are roommates for 23 years.  The brother works 3rd shift and gets off about 8am and then puts Mr Cassada on HD in the morning after getting home. Most of the time he does HD about 4 times a week, for  about 4 hours per treatment. Cause of ESRD was HTN according to patient. He says he let his health go and ending up with complications, and that he didn't like seeing doctors in those days.  He says he was diagnosed with severe HTN when he lived in [place] in his 51's.   . Hepatitis B carrier ([place])   . HIV infection ([place])   . Hypertension   . Hyperthyroidism    normal now  . Pneumonia several yrs ago  . Seizure ([place])   . Seizures ([place]) 06/02/2011   x 1 none since  . Thrombocytopenia [facility])     Assessment: 62 yo male with suspected HIT (4T score ~4 intermediate probability  with onset <5 days, no thrombosis noted, and no other cause identified). MD wishes to proceed with argatroban. Heparin antibody test pending.    aPTT remains slightly supratherapeutic at 96 seconds. Hgb 9.2, plt 79 - trending upward from lowest point of 67. No s/sx of bleeding. No infusion issues noted by nursing.   Heparin induced platelet antibody came back at 0.132.   Goal of Therapy:  APTT 50-90 secs Monitor platelets by anticoagulation protocol: Yes   Plan:  Decrease argatroban gtt to 0.6 mcg/kg/min F/u 2h aPTT  Daily aPTT/CBC  Monitor for s/sx of bleeding  Doylene Canard, PharmD Clinical Pharmacist  Pager: [phone] Phone: [phone] 02/26/2018 10:47 AM  ADDENDUM Given heparin induced platelet antibody OD came back at 0.132 and 4Ts score of 4, based on HIT algorithm, HIT unlikely. Will not order SRA for confirmation given lab results. Heparin allergy removed and rationale documented. Discussed with team and okay to transition to back to heparin for DVT ppx. Will order heparin 5000 units subQ three times daily. Will continue to monitor CBC.  Doylene Canard, PharmD Clinical Pharmacist  Pager: [phone] Phone: [phone]

## 2018-02-26 NOTE — Progress Notes (Signed)
  Date: 02/26/2018  Patient name: Jeffrey [name]  Medical record number: [id number]  Date of birth: 02-12-1956   I have seen and evaluated this patient and I have discussed the plan of care with the house staff. Please see Dr. Nelma Rothman note for complete details. I concur with his findings.  Correction: Heparin was initially discontinued in the setting of concern for HIT, however, this test returned with low suspicion and heparin is restarted for DVT ppx.  Please see pharmacy note from today.  Sid Falcon, MD 02/26/2018, 9:16 PM

## 2018-02-27 LAB — CBC
HCT: 27.2 % — ABNORMAL LOW (ref 39.0–52.0)
HEMOGLOBIN: 8.6 g/dL — AB (ref 13.0–17.0)
MCH: 31.5 pg (ref 26.0–34.0)
MCHC: 31.6 g/dL (ref 30.0–36.0)
MCV: 99.6 fL (ref 78.0–100.0)
PLATELETS: 95 10*3/uL — AB (ref 150–400)
RBC: 2.73 MIL/uL — AB (ref 4.22–5.81)
RDW: 23.4 % — ABNORMAL HIGH (ref 11.5–15.5)
WBC: 5.1 10*3/uL (ref 4.0–10.5)

## 2018-02-27 LAB — RENAL FUNCTION PANEL
ALBUMIN: 2.8 g/dL — AB (ref 3.5–5.0)
ANION GAP: 11 (ref 5–15)
BUN: 52 mg/dL — ABNORMAL HIGH (ref 6–20)
CALCIUM: 7.9 mg/dL — AB (ref 8.9–10.3)
CO2: 26 mmol/L (ref 22–32)
Chloride: 95 mmol/L — ABNORMAL LOW (ref 101–111)
Creatinine, Ser: 9.57 mg/dL — ABNORMAL HIGH (ref 0.61–1.24)
GFR calc non Af Amer: 5 mL/min — ABNORMAL LOW (ref >60)
GFR, EST AFRICAN AMERICAN: 6 mL/min — AB (ref >60)
GLUCOSE: 105 mg/dL — AB (ref 65–99)
Phosphorus: 3.6 mg/dL (ref 2.5–4.6)
Potassium: 3.1 mmol/L — ABNORMAL LOW (ref 3.5–5.1)
SODIUM: 132 mmol/L — AB (ref 135–145)

## 2018-02-27 LAB — GLUCOSE, CAPILLARY
Glucose-Capillary: 111 mg/dL — ABNORMAL HIGH (ref 65–99)
Glucose-Capillary: 149 mg/dL — ABNORMAL HIGH (ref 65–99)
Glucose-Capillary: 120 mg/dL — ABNORMAL HIGH (ref 65–99)

## 2018-02-27 MED ORDER — CALCIUM ACETATE (PHOS BINDER) 667 MG PO CAPS
667.0000 mg | ORAL_CAPSULE | Freq: Three times a day (TID) | ORAL | Status: DC | PRN
  Administered 2018-02-27: 667 mg via ORAL

## 2018-02-27 MED ORDER — CALCIUM ACETATE (PHOS BINDER) 667 MG PO CAPS
1334.0000 mg | ORAL_CAPSULE | Freq: Three times a day (TID) | ORAL | Status: DC
  Administered 2018-02-27 – 2018-02-28 (×2): 1334 mg via ORAL
  Filled 2018-02-27 (×2): qty 2

## 2018-02-27 NOTE — Progress Notes (Signed)
  Date: 02/27/2018  Patient name: Jeffrey [name]  Medical record number: [id number]  Date of birth: 09-30-1955   This patient's plan of care was discussed with the house staff. Please see their note for complete details. I concur with their findings.   Sid Falcon, MD 02/27/2018, 8:01 PM

## 2018-02-27 NOTE — Progress Notes (Signed)
Subjective: Jeffrey [name] is feeling okay today. He was able to get up and walk around the halls yesterday. He has questions about the plans moving forward, he would like to discuss more when his brother comes to visit later today.   Objective:  Vital signs in last 24 hours: Vitals:   02/26/18 2030 02/26/18 2341 02/27/18 0449 02/27/18 0518  BP: (!) 111/59 134/75 135/68 136/61  Pulse: 76 85 81   Resp: 18     Temp: 99 F (37.2 C) 98.5 F (36.9 C) 99.1 F (37.3 C)   TempSrc: Oral Oral Oral   SpO2: 100% 100% 100% 97%  Weight:   173 lb 8 oz (78.7 kg)    General: chronically ill appearing, no acute distress  Cardiac: regular rate and rhythm, no murmur, left parasternal heave, palpable thrill of the left upper extremity  Pulm: normal work of breathing, lungs clear to auscultation  GI: BS+, abdomen is soft, non tender, non distended   Assessment/Plan:  Principal Problem:   Seizure ([facility]) Active Problems:   HIV disease ([place])   End stage renal disease on home HD   Essential hypertension   Anemia of chronic kidney failure, unspecified stage   Mass of duodenum   Lactic acidosis   Acute uremia   Malnutrition of moderate degree  Assessment: Jeffrey [name] is a 62 year old male with a PMHx notable for ESRD on home HD,priorseizurein 2012, HTN, HIV w/ undetectable most recent viral load, and anemia who was noted to have rhythmic muscle contractions and acute encephalopathyat home. This developed into a generalized tonic clonic seizure and started on heparin.This is thought to be secondary to metabolic derangement from insufficient dialysis in the setting of ESRD. Patient previously presented with aseizure in 2012 and was diagnosed with ESRD as the inciting etiology prior to beingplaced on dialysis at that time. As per brother this event closely resembles his initial presenting seizure and subsequent renal failure diagnosis. He improved notably with hemodialysis following a prolonged post  ictal/acute encephalopathic state.Patient underwent multiple rounds ofsuccessfulhemodialysisbut continued to progressively weaken due to his overall status.He was placed on Megace in an attempt to improve his functional status as well as consult to the dietician to assist with the process.  Plan: Seizure: A prior seizure occurred at the time he was diagnosed withESRDin 2012 as per brother. I still feel that his presentation for this admission is related to his uremic state and general metabolic derangements.BrainCT/MRIwere unremarkable and infectious evaluation unrevealing at this timewith notable improvement following HD. No further seizures since the time of admission  -Keppra discontinued due to potential to induce thrombocytopenia -Tele transfer todaywith possible discharge home if he is able to ambulate -ContinueHD as per Nephrology -Renal function panel indicating marked improvement in his BUN down from 178as the peak. -CBCwithout leukocytosis,patient afebrile  Thrombocytopenia: Platelet count dropped from 152 to 68K by day four of admission. Stable today at 95K after discontinuation of keppra. Heparin induced platelet antibody OD 0.13, 4T score 4 making HIT unlikely. Plt count has improved with holding keppra.  --continue Heparin  --Daily CBC  ESRD on Home HD MWF  Continue HD as per nephrology. --Nephrology consulted for HD, we appreciate their continued assistance --Renal panel in amas per Nephro - metabolic bone disease - continue calcium acetate binders  - normotensive, K 2.9 yesterday > repeat BMP today   Anemia of chronic kidney disease  - received 1 unit  PRBC during HD 6/9  Generalized weakness: Likely due to  malignancy induced decreasing oral intake. He will need to increase his oral intake prior to be stabilized for discharge.  --Dietician consult placed --Glucerna ordered --Continue Megace   HTN: Secondary to missed HD with volume overload.    Improvedmarkedlyfollowing HD and with his home meds continues, 136/61 today  --ContinueHD  --ContinueClonidine 0.1 mg patch --Continue home atHydralazine 75mg Q8 hours --Continue carvedilol 25mg  BID  HIV: Last viral load undetectable.  --Continuedhome antiviralsvictegravir-emtricitabine-tenofovir   Dispo: Anticipated discharge in approximately 1-2 day(s).   Ledell Noss, MD 02/27/2018, 8:38 AM Pager: [phone]

## 2018-02-27 NOTE — Progress Notes (Signed)
Called Phleb and reminded CBC and Renal panel is ordered for 0500

## 2018-02-27 NOTE — Progress Notes (Signed)
Messaged via Amion sent to IM resideney: 3w 39 potassium was drawn am 6/8 and never replaced.  can you add BMP/CMP to 6/9 am labs?

## 2018-02-28 ENCOUNTER — Other Ambulatory Visit: Payer: Self-pay | Admitting: Internal Medicine

## 2018-02-28 LAB — RENAL FUNCTION PANEL
ALBUMIN: 2.6 g/dL — AB (ref 3.5–5.0)
Anion gap: 13 (ref 5–15)
BUN: 73 mg/dL — ABNORMAL HIGH (ref 6–20)
CALCIUM: 8.2 mg/dL — AB (ref 8.9–10.3)
CO2: 25 mmol/L (ref 22–32)
Chloride: 95 mmol/L — ABNORMAL LOW (ref 101–111)
Creatinine, Ser: 11.74 mg/dL — ABNORMAL HIGH (ref 0.61–1.24)
GFR, EST AFRICAN AMERICAN: 5 mL/min — AB (ref >60)
GFR, EST NON AFRICAN AMERICAN: 4 mL/min — AB (ref >60)
GLUCOSE: 102 mg/dL — AB (ref 65–99)
PHOSPHORUS: 4.5 mg/dL (ref 2.5–4.6)
Potassium: 3.4 mmol/L — ABNORMAL LOW (ref 3.5–5.1)
SODIUM: 133 mmol/L — AB (ref 135–145)

## 2018-02-28 LAB — GLUCOSE, CAPILLARY: Glucose-Capillary: 112 mg/dL — ABNORMAL HIGH (ref 65–99)

## 2018-02-28 LAB — CBC
HEMATOCRIT: 26.3 % — AB (ref 39.0–52.0)
HEMOGLOBIN: 8.5 g/dL — AB (ref 13.0–17.0)
MCH: 31.8 pg (ref 26.0–34.0)
MCHC: 32.3 g/dL (ref 30.0–36.0)
MCV: 98.5 fL (ref 78.0–100.0)
Platelets: 95 10*3/uL — ABNORMAL LOW (ref 150–400)
RBC: 2.67 MIL/uL — AB (ref 4.22–5.81)
RDW: 22.9 % — AB (ref 11.5–15.5)
WBC: 5 10*3/uL (ref 4.0–10.5)

## 2018-02-28 MED ORDER — MEGESTROL ACETATE 40 MG PO TABS: 80.0000 mg | ORAL_TABLET | Freq: Two times a day (BID) | ORAL | 0 refills | Status: DC

## 2018-02-28 MED ORDER — NEPRO/CARBSTEADY PO LIQD
237.0000 mL | Freq: Two times a day (BID) | ORAL | Status: DC
  Filled 2018-02-28 (×2): qty 237

## 2018-02-28 MED ORDER — TRAZODONE HCL 50 MG PO TABS: 50.0000 mg | ORAL_TABLET | Freq: Every day | ORAL | Status: DC

## 2018-02-28 MED ORDER — HYDRALAZINE HCL 25 MG PO TABS: 75.0000 mg | ORAL_TABLET | Freq: Three times a day (TID) | ORAL | 1 refills | Status: DC

## 2018-02-28 MED ORDER — RAMELTEON 8 MG PO TABS
8.0000 mg | ORAL_TABLET | Freq: Every day | ORAL | Status: DC
  Administered 2018-02-28: 8 mg via ORAL
  Filled 2018-02-28 (×2): qty 1

## 2018-02-28 NOTE — Progress Notes (Signed)
Discharge instructions, Rx's and follow up appts explained and provided to patient and caregiver verbalized understanding. patient left floor via wheelchair accompanied by staff no c/o pain or shortness of breath at d/c.  Ether Wolters, Tivis Ringer, RN

## 2018-02-28 NOTE — Progress Notes (Signed)
OT Cancellation Note  Patient Details Name: Jeffrey [name] MRN: [id number] DOB: 1956/02/01   Cancelled Treatment:    Reason Eval/Treat Not Completed: Patient at procedure or test/ unavailable(HD)  Merri Ray Dalyn Becker 02/28/2018, 12:49 PM  Hulda Humphrey OTR/L [phone]

## 2018-02-28 NOTE — Progress Notes (Signed)
Patient ID: Jeffrey [name], male   DOB: 12/09/1955, 61 y.o.   MRN: [id number] [facility] Progress Note   Assessment/ Plan:   1.  Seizures: due to lowered seizure threshold due to missed HD and uremia. Significant ^^BUN/ creat now resolved with HD x 3 in hospital.  Seizures meds stopped and pt much better.  2.  ESRD: On home hemodialysis f/b [facility] in [place]. Will resume his home HD after dc. For HD today and poss dc thereafter. Vol status is at baseline.  3. Anemia: Secondary to anemia of chronic kidney disease/ESRD, sp prbc's 4. CKD-MBD: Resume phosphorus binders/continue renal diet. 5.  HIV infection: Continue antiretroviral therapy. 6. Hypertension: Blood pressure acceptable on current regimen 7. Thrombocytopenia: Converted from heparin to argatroban and HIT panel low +. Per primary team.   Kelly Splinter MD [facility] pgr [phone]   02/28/2018, 1:19 PM    Subjective:   Doing well , for hd today and prob dc after HD   Objective:   BP (!) 151/86   Pulse 77   Temp 98.8 F (37.1 C) (Oral)   Resp 18   Wt 77.6 kg (171 lb 1.2 oz)   SpO2 100%   BMI 23.20 kg/m   Physical Exam: Gen: Comfortably resting in bed, CVS: Pulse regular tachycardia, S1 and S2 with ejection systolic murmur Resp: Anteriorly clear to auscultation, no rales/rhonchi Abd: Soft, flat, nontender Ext: No lower extremity edema, left radiocephalic fistula with palpable thrill/dressings intact  Labs: BMET Recent Labs  Lab 02/21/18 1947 02/22/18 0134 02/23/18 0800 02/25/18 0819 02/26/18 0930 02/27/18 0809  NA 131* 132* 136 137 137 132*  K 4.4 5.3* 3.9 3.5 2.9* 3.1*  CL 98* 94* 94* 102 96* 95*  CO2  --  13* 24 22 30 26   GLUCOSE 115* 131* 82 105* 107* 105*  BUN >140* 178* 85* 65* 32* 52*  CREATININE >18.00* 21.55* 12.92* 11.30* 7.28* 9.57*  CALCIUM  --  7.7* 8.0* 7.5* 8.1* 7.9*  PHOS  --   --  7.5* 5.2* 3.4 3.6   CBC Recent Labs  Lab 02/21/18 1917  02/22/18 0351   02/23/18 0800  02/26/18 0335 02/26/18 0927 02/27/18 0805 02/28/18 0422  WBC 7.9  --  9.0   < > 5.8   < > 5.1 5.1 5.1 5.0  NEUTROABS 4.4  --  7.7  --  4.5  --   --   --   --   --   HGB 9.4*   < > 9.5*   < > 8.5*   < > 9.2* 9.2* 8.6* 8.5*  HCT 29.1*   < > 28.9*   < > 25.9*   < > 28.5* 28.9* 27.2* 26.3*  MCV 99.0  --  96.0   < > 96.6   < > 99.3 99.7 99.6 98.5  PLT 152  --  111*   < > 76*   < > 70* 79* 95* 95*   < > = values in this interval not displayed.   Medications:    . bictegravir-emtricitabine-tenofovir AF  1 tablet Oral Daily  . calcium acetate  1,334-2,001 mg Oral TID WC  . carvedilol  25 mg Oral BID WC  . Chlorhexidine Gluconate Cloth  6 each Topical Q0600  . cloNIDine  0.1 mg Transdermal Q Fri  . feeding supplement (GLUCERNA SHAKE)  237 mL Oral TID BM  . heparin injection (subcutaneous)  5,000 Units Subcutaneous Q8H  . hydrALAZINE  75 mg  Oral Q8H  . megestrol  80 mg Oral BID  . ramelteon  8 mg Oral QHS  . traZODone  50 mg Oral QHS

## 2018-02-28 NOTE — Progress Notes (Signed)
Sent another msg to [facility] regarding Trazadone

## 2018-02-28 NOTE — Progress Notes (Addendum)
Sent amion msg to [facility] 3W 39 can he have 50mg  Trazadone for insomnia?

## 2018-02-28 NOTE — Plan of Care (Signed)
  Problem: Education: Goal: Knowledge of General Education information will improve Outcome: Completed/Met   Problem: Health Behavior/Discharge Planning: Goal: Ability to manage health-related needs will improve Outcome: Completed/Met   Problem: Clinical Measurements: Goal: Ability to maintain clinical measurements within normal limits will improve Outcome: Completed/Met   Problem: Elimination: Goal: Will not experience complications related to bowel motility Outcome: Completed/Met   Problem: Pain Managment: Goal: General experience of comfort will improve Outcome: Completed/Met   Problem: Safety: Goal: Ability to remain free from injury will improve Outcome: Completed/Met   Problem: Activity: Goal: Ability to tolerate increased activity will improve Outcome: Completed/Met   Problem: Education: Goal: Knowledge of disease and its progression will improve Outcome: Completed/Met   Problem: Coping: Goal: Development of coping mechanisms to deal with changes in body function or appearance will improve Outcome: Completed/Met   Problem: Fluid Volume: Goal: Compliance with measures to maintain balanced fluid volume will improve Outcome: Completed/Met   Problem: Health Behavior/Discharge Planning: Goal: Ability to manage health-related needs will improve Outcome: Completed/Met   Problem: Nutritional: Goal: Ability to make healthy dietary choices will improve Outcome: Completed/Met   Problem: Physical Regulation: Goal: Complications related to the disease process, condition or treatment will be avoided or minimized Outcome: Completed/Met

## 2018-02-28 NOTE — Progress Notes (Signed)
Spoke to [place] in Pharmacy asking to tube up [place].

## 2018-02-28 NOTE — Progress Notes (Signed)
Nutrition Follow-up  DOCUMENTATION CODES:   Non-severe (moderate) malnutrition in context of chronic illness  INTERVENTION:   - d/c Glucerna Shake po, each supplement provides 220 kcal and 10 grams of protein  - To maximize protein and calorie intake, Nepro Shake po TID, each supplement provides 425 kcal and 19 grams protein  - Encourage PO intake  NUTRITION DIAGNOSIS:   Moderate Malnutrition related to chronic illness(ESRD, ) as evidenced by percent weight loss, moderate fat depletion, moderate muscle depletion.  Ongoing, addressing with oral nutrition supplements  GOAL:   Patient will meet greater than or equal to 90% of their needs  Progressing  MONITOR:   Diet advancement, Skin, Weight trends, Labs, I & O's  REASON FOR ASSESSMENT:   Consult Assessment of nutrition requirement/status  ASSESSMENT:   62 y.o. M admitted on 02/21/18 for acidosis secondary to hypoxia, seizures, and uremia with a malignant mass in the duodenum. PMH of ESRD on home HD since 2012, HIV, HTN, thrombocytopenia, hepatitis B carrier, anemia, asthma, arthitis, and seizures.    Spoke with pt in HD. Pt resting at time of visit. Per nephrology note, pt may discharge after HD today.  Pt states that he is eating well and had [place] toast and 2 boiled eggs for breakfast this morning. Spoke with RN who reports that she saw pt with blueberry pancakes when giving him his morning medications around 1100. RN reports also seeing a [employer] bag in pt's room at that time. The only meal completion in pt's chart is 50% on 02/26/18. It is unclear how much pt is eating as he is receiving outside food.  Pt reports that he has been drinking the Glucerna oral nutrition supplements. RN confirms. RD to change order to Nepro BID as Nepro provides more kcal and protein per supplement and will help maximize pt's kcal and protein intake.  Pt's weight is down 9 lbs since admission on 6/5. Since admission, pt's weight has  fluctuated between 159-182 lbs. Suspect weight fluctuations related to fluid fluctuations. Most recent recorded post-dialysis weight is 74 kg on 02/25/18.  Medications reviewed and include: Phoslo TID, Glucerna TID, 80 mg Megace BID  Labs reviewed: hemoglobin 8.5 (L), HCT 26.3 (L), sodium 132 (L), potassium 3.2 (L), chloride 95 (L), BUN 52 (H), creatinine 9.57 (H), calcium 7.9 (L) CBG's: 112, 120, 149 x 24 hours  I/O's: -3.6 L since admission  Diet Order:   Diet Order           Diet renal with fluid restriction Fluid restriction: 1200 mL Fluid; Room service appropriate? Yes; Fluid consistency: Thin  Diet effective now          EDUCATION NEEDS:   Education needs have been addressed  Skin:  Skin Assessment: Reviewed RN Assessment  Last BM:  02/27/18 per pt report  Height:   Ht Readings from Last 1 Encounters:  02/17/18 6' (1.829 m)    Weight:   Wt Readings from Last 1 Encounters:  02/28/18 171 lb 1.2 oz (77.6 kg)    Ideal Body Weight:  80.9 kg  BMI:  Body mass index is 23.2 kg/m.  Estimated Nutritional Needs:   Kcal:  2000-2200 kcal  Protein:  110-125 grams  Fluid:  1200 ml fluid restriction per MD    Gaynell Face, MS, RD, LDN Pager: [phone] Weekend/After Hours: [phone]

## 2018-02-28 NOTE — Progress Notes (Signed)
Subjective: The patient was resting in his bed today and stated that he felt stronger. He states he feels up to going home and will be ready for discharge following dialysis. He is to follow-up with a tertiary care center for the endoscopic removal of his distal duodenal mass. I again discussed that this was his best option moving forward and as per our surgeons here, his only true option.   Objective:  Vital signs in last 24 hours: Vitals:   02/28/18 1330 02/28/18 1400 02/28/18 1430 02/28/18 1500  BP: (!) 143/71 135/63 138/74 129/60  Pulse: 80 85 89 94  Resp:      Temp:      TempSrc:      SpO2:      Weight:       Physical Exam  Constitutional: He appears well-developed and well-nourished. No distress.  HENT:  Head: Normocephalic and atraumatic.  Small hematoma of the right neck 2/2 the HD catheter placement. Improving   Cardiovascular: Normal rate and regular rhythm.  No murmur heard. Pulmonary/Chest: Effort normal and breath sounds normal. No stridor. No respiratory distress.  Abdominal: Soft. Bowel sounds are normal.  Musculoskeletal: He exhibits no edema.  Neurological: He is alert.  Skin: He is not diaphoretic.  Psychiatric: He has a normal mood and affect.  Vitals reviewed.  Assessment/Plan:  Principal Problem:   Seizure ([place]) Active Problems:   HIV disease ([place])   End stage renal disease on home HD   Essential hypertension   Anemia of chronic kidney failure, unspecified stage   Mass of duodenum   Lactic acidosis   Acute uremia   Malnutrition of moderate degree   Assessment: Jeffrey [name] is a 62 year old male with a PMHx notable for ESRD on home HD,priorseizurein 2012, HTN, HIV w/ undetectable most recent viral load, and anemia who was noted to have rhythmic muscle contractions and acute encephalopathyat home. This developed into a generalized tonic clonic seizure and started on heparin.This is thought to be secondary to metabolic derangement from  insufficient dialysis in the setting of ESRD. Patient previously presented with aseizure in 2012 and was diagnosed with ESRD as the inciting etiology prior to beingplaced on dialysis at that time. As per brother this event closely resembles his initial presenting seizure and subsequent renal failure diagnosis. He improved notably with hemodialysis following a prolonged post ictal/acute encephalopathic state.Patient underwent multiple rounds ofsuccessfulhemodialysisbut continued to progressively weaken due to his overall status.He was placed on Megace in an attempt to improve his functional status as well as consult to the dietician to assist with the process. The patient was determined to be stable for discharge by day 7 following symptomatic improvement in his weakness.  Plan: Seizure: A prior seizure occurred at the time he was diagnosed withESRDin 2012 as per brother. I still feel that his presentation for this admission is related to his uremic state and general metabolic derangementswith no other clear etiology identifiedbut have been unable to delineate a definitive cause.BrainCT/MRIwere unremarkable and infectious evaluation unrevealing at this timewith notable improvement following HD.  -Keppra PO 500mg  Q12 hours starting this PMbut discontinued due to potential to induce thrombocytopenia -HIT antibody negative, discontinued argatroban and ordered Heparin per pharmacy -ContinueHD as per Nephrology  Thrombocytopenia: Platelet count dropped from 152 to 68K by day four of admission but increased to 95K by discharge. HIT negative --Heparin resumed --CBC in one week  ESRD on Home HD: Continue HD as per nephrology. --Nephrology consulted for HD, we appreciate  their continued assistance --Renal panel as per Nephro  Generalized weakness: Likely due to malignancy induced decreasing oral intake. He will need to increase his oral intake prior to be stabilized for discharge.   --Dietician consult placed --Glucerna ordered --Continue Megace   HTN: Severely elevatedthe priordayand on admission. Patient hadnot been completing fullHD sessionsat home due to weakness nor hadhe been taking all of his home medications.He was not given his PRN hydralazine by the ED staff as directed resulting in continued elevation of his BP. Improvedmarkedlyfollowing HD and with his home meds continues. --ContinueHD  --ContinueClonidine patch --Continue home atHydralazine 75mg Q8 hours --Continue carvedilol 25mg  BID  HIV: Last viral load undetectable.  --Continuedhome antivirals  Diet: NPO Code: Full Fluids: None, needs HD Dvt RWC:[id number] Dispo: Anticipated discharge in approximately 0 day(s).   Kathi Ludwig, MD 02/28/2018, 3:28 PM Pager: Pager# [phone]

## 2018-02-28 NOTE — Progress Notes (Signed)
PT Cancellation Note  Patient Details Name: Jeffrey [name] MRN: [id number] DOB: 04/06/1956   Cancelled Treatment:    Reason Eval/Treat Not Completed: Patient at procedure or test/unavailable. Pt in HD. Will check back as time allows.  Benjiman Core, PTA Pager [phone] Acute Rehab  Allena Katz 02/28/2018, 2:48 PM

## 2018-02-28 NOTE — Progress Notes (Signed)
Pt. Asked to see CN, has complaints about Dr. Daryll Drown, requesting no further care from her. Notified Dr. Danford Bad who stated Dr. Daryll Drown is not on the IM service at this time.

## 2018-03-01 DIAGNOSIS — D631 Anemia in chronic kidney disease: Secondary | ICD-10-CM | POA: Diagnosis not present

## 2018-03-01 DIAGNOSIS — Z992 Dependence on renal dialysis: Secondary | ICD-10-CM | POA: Diagnosis not present

## 2018-03-01 DIAGNOSIS — N186 End stage renal disease: Secondary | ICD-10-CM | POA: Diagnosis not present

## 2018-03-01 LAB — HEPATITIS B SURFACE AG, CONFIRM: HBsAg Confirmation: POSITIVE — AB

## 2018-03-01 LAB — HEPATITIS B SURFACE ANTIGEN

## 2018-03-01 LAB — HEPATITIS B SURFACE ANTIBODY,QUALITATIVE: Hep B S Ab: NONREACTIVE

## 2018-03-01 NOTE — Telephone Encounter (Signed)
Thank you :)

## 2018-03-01 NOTE — Telephone Encounter (Signed)
Patient requesting 90 day supply of hydralazine. Given 15 day supply on hospital discharge (6/10). Request denied. Please have patient schedule a hospital follow up appointment in [facility]. He can be given a new prescription then. Thank you.

## 2018-03-03 DIAGNOSIS — N186 End stage renal disease: Secondary | ICD-10-CM | POA: Diagnosis not present

## 2018-03-03 DIAGNOSIS — D631 Anemia in chronic kidney disease: Secondary | ICD-10-CM | POA: Diagnosis not present

## 2018-03-03 DIAGNOSIS — Z992 Dependence on renal dialysis: Secondary | ICD-10-CM | POA: Diagnosis not present

## 2018-03-04 ENCOUNTER — Encounter: Payer: Self-pay | Admitting: Internal Medicine

## 2018-03-04 ENCOUNTER — Ambulatory Visit: Payer: Medicare Other

## 2018-03-04 DIAGNOSIS — Z992 Dependence on renal dialysis: Secondary | ICD-10-CM | POA: Diagnosis not present

## 2018-03-04 DIAGNOSIS — D631 Anemia in chronic kidney disease: Secondary | ICD-10-CM | POA: Diagnosis not present

## 2018-03-04 DIAGNOSIS — N186 End stage renal disease: Secondary | ICD-10-CM | POA: Diagnosis not present

## 2018-03-04 IMAGING — DX DG CHEST 2V
2 series · 2 of 2 positions shown · non-contrast
Comparison: 08/03/2016

CLINICAL DATA: Dyspnea, onset this morning while in bed.

EXAM:
CHEST  2 VIEW

[chest lat]
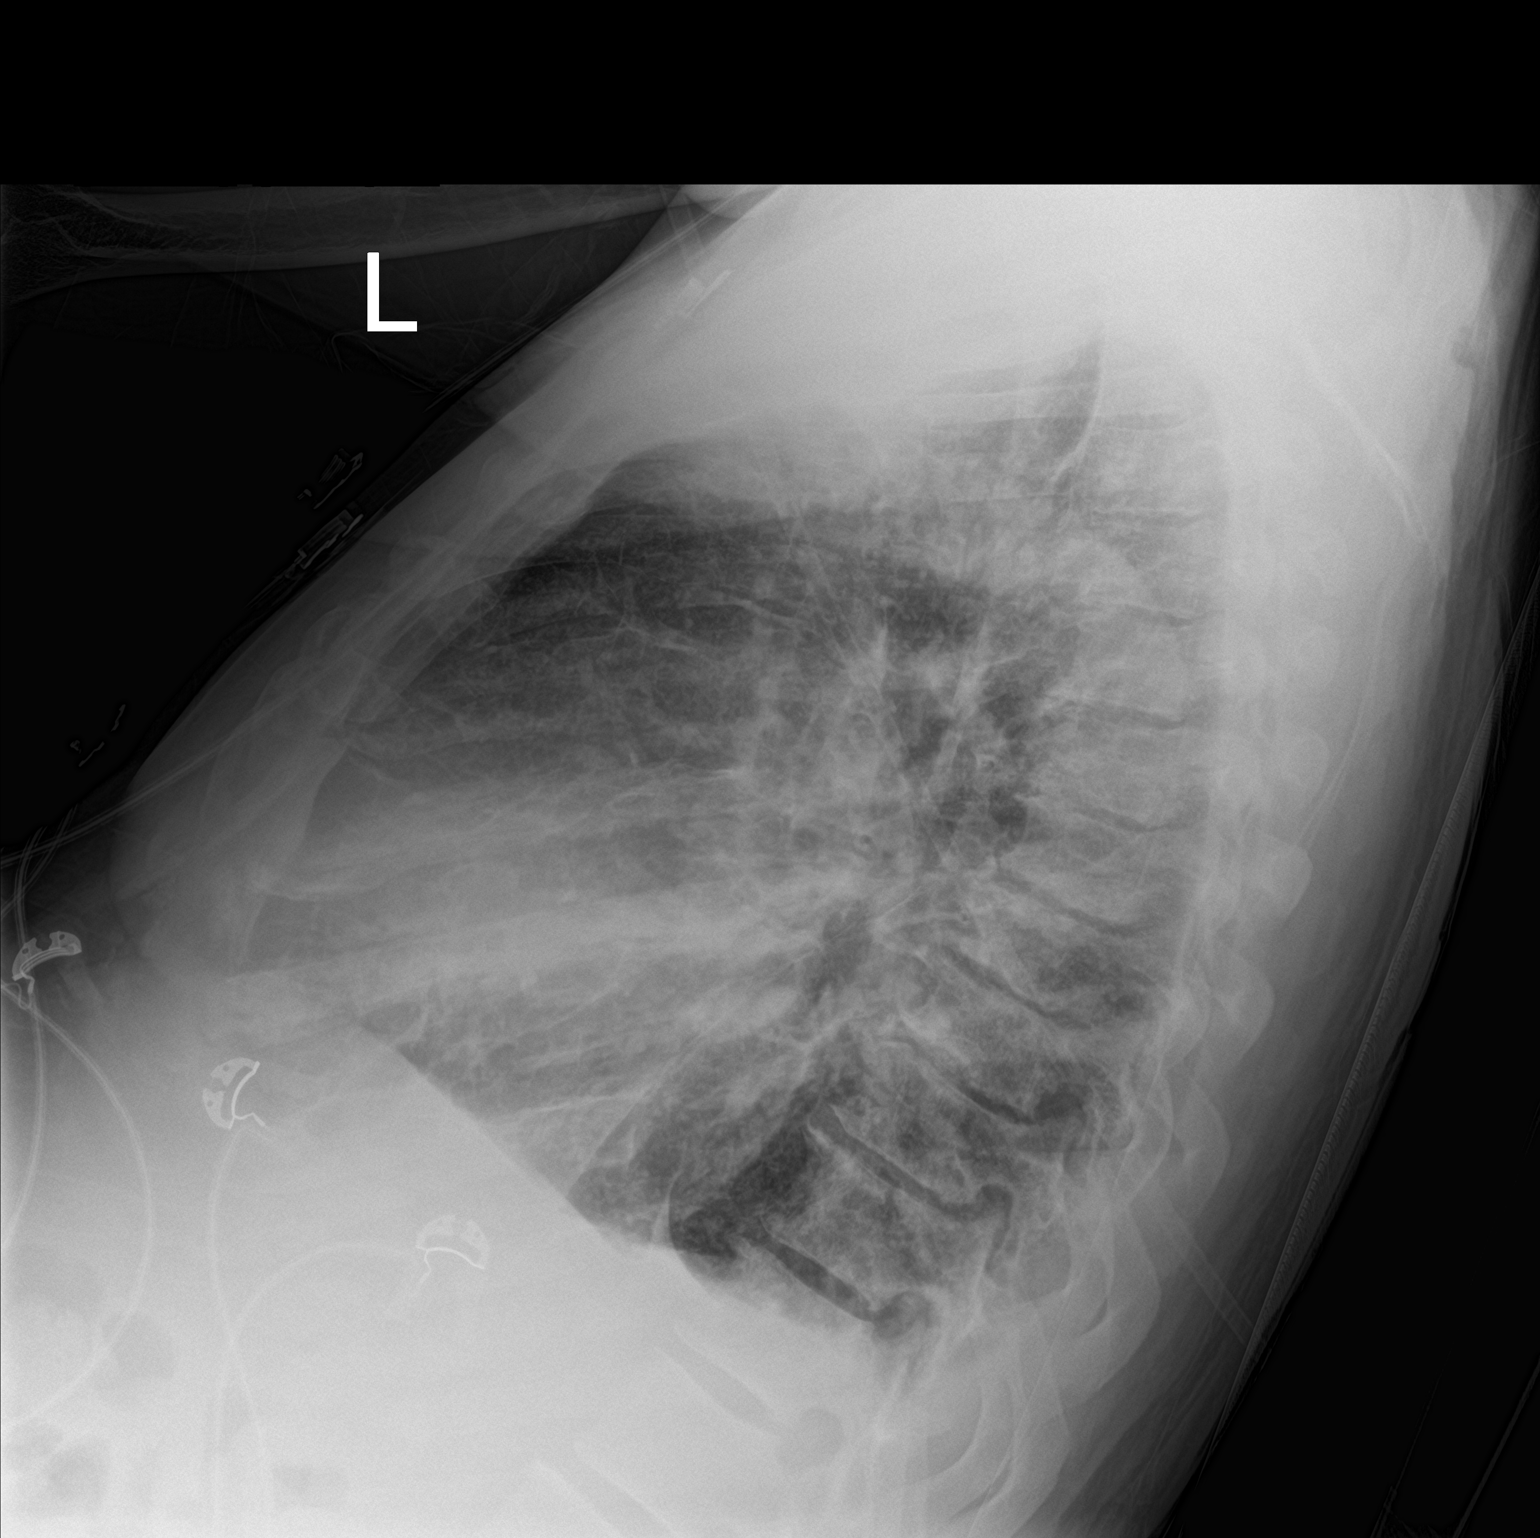

[chest ap]
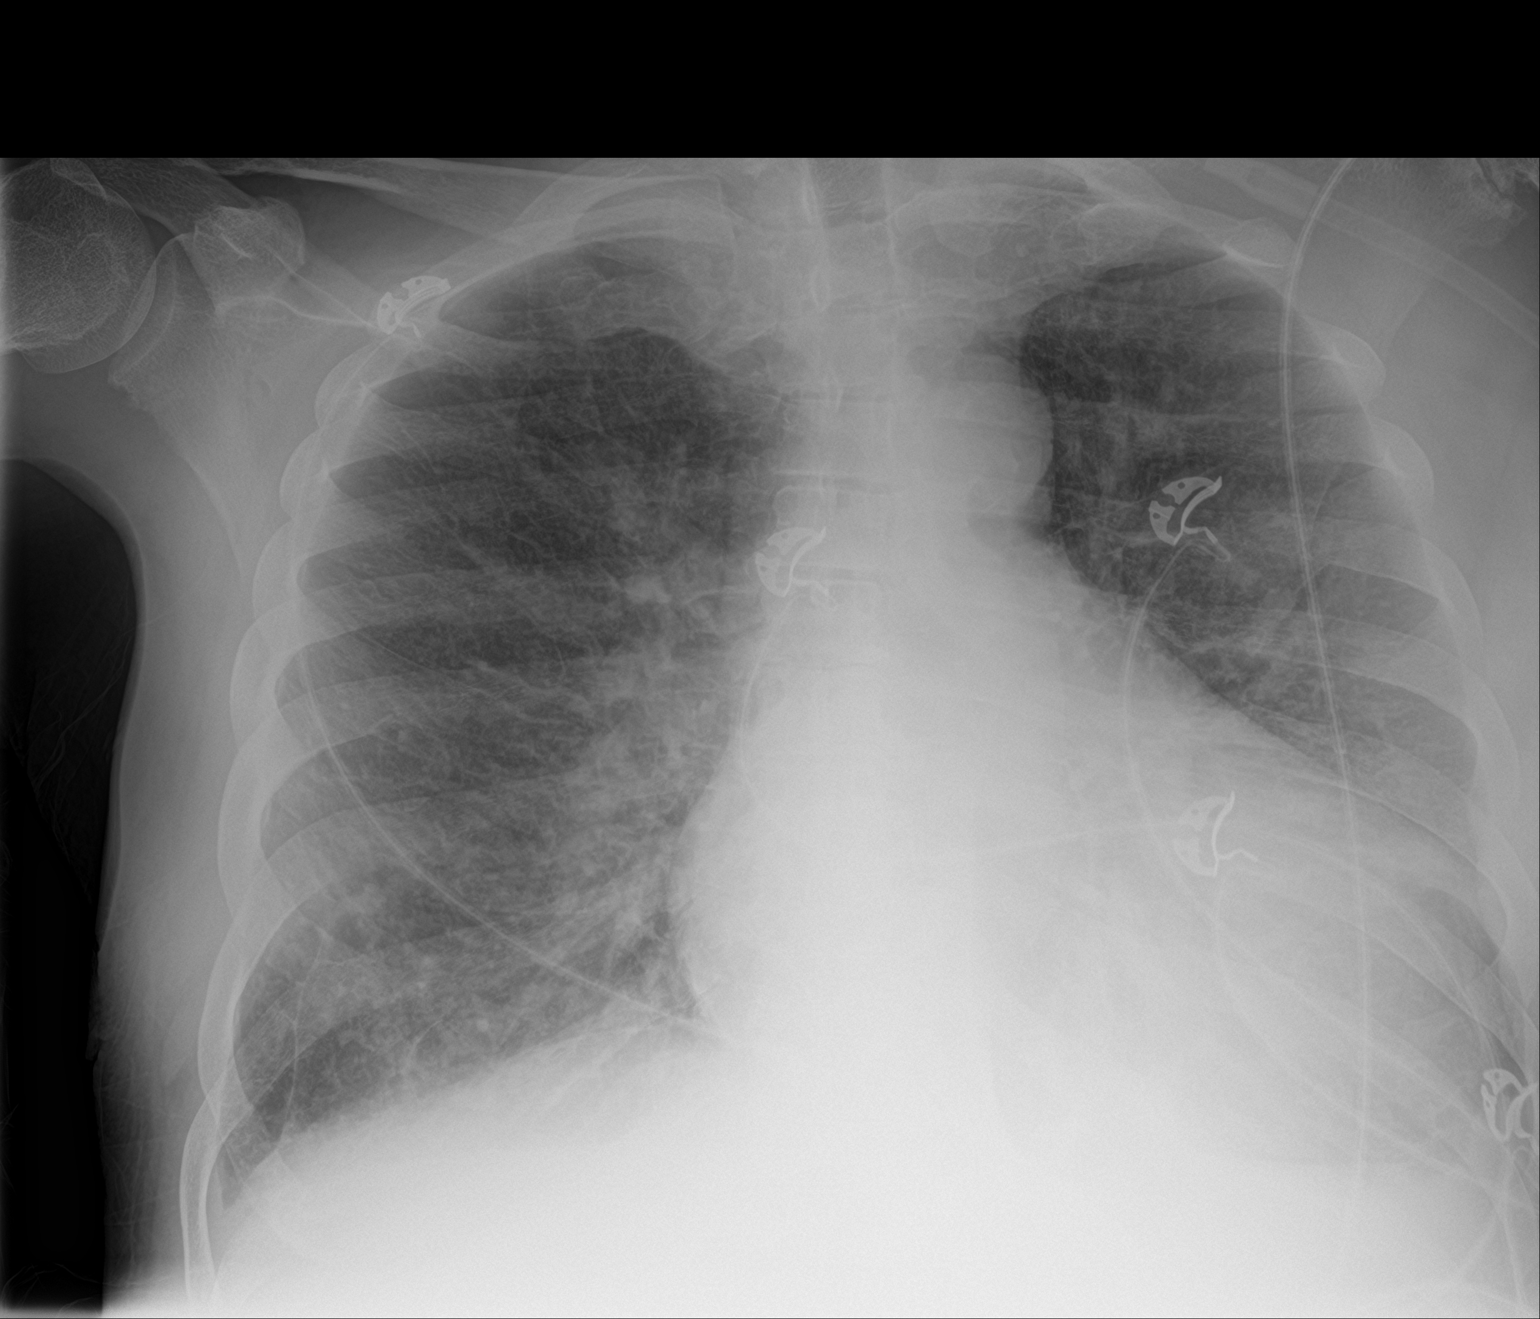

[2 of 2 positions shown; findings below may reference images not displayed]

FINDINGS: Marked cardiomegaly, unchanged. Moderate vascular and interstitial
prominence, new/ worsened. Small bilateral pleural effusions, new.
Mild central airspace opacities.
IMPRESSION: The findings likely represent congestive heart failure with
interstitial and alveolar edema, as well as small bilateral
effusions.

## 2018-03-05 IMAGING — MR MR LUMBAR SPINE W/O CM
2 series · 19 of 30 positions shown · non-contrast
Comparison: 08/05/2016

CLINICAL DATA: Bacteremia.

EXAM:
MRI LUMBAR SPINE WITHOUT CONTRAST
TECHNIQUE: Multiplanar, multisequence MR imaging of the lumbar spine was
performed. No intravenous contrast was administered.

[Series 3: T2 · sagittal · 4.0mm · 0.55mm/px · 10 of 15 slices shown]
[im 1/15]
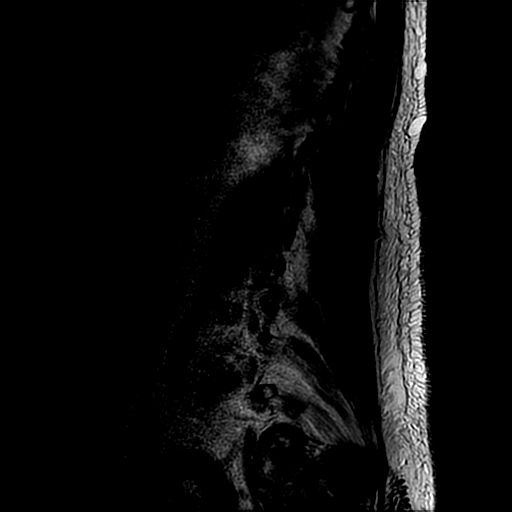
[im 2/15]
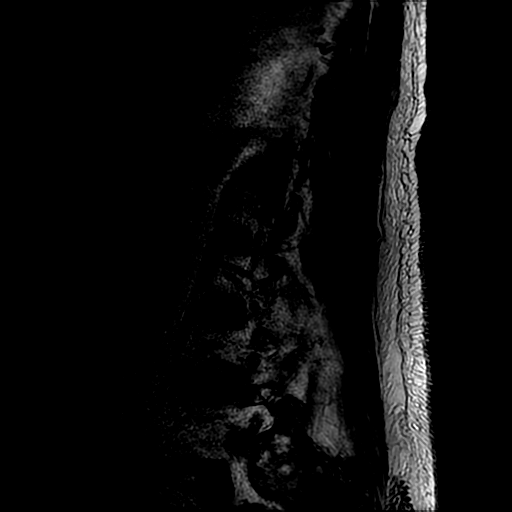
[im 3/15]
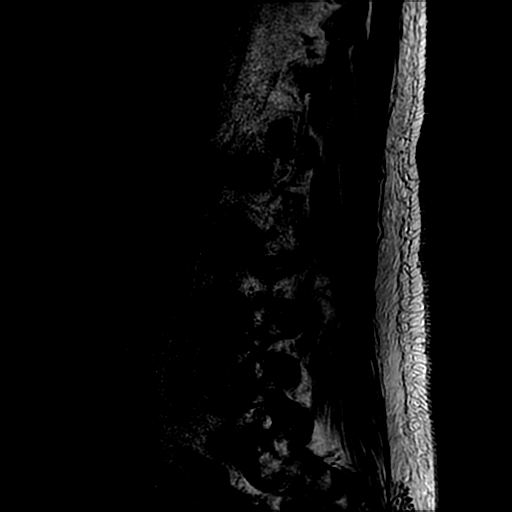
[im 5/15]
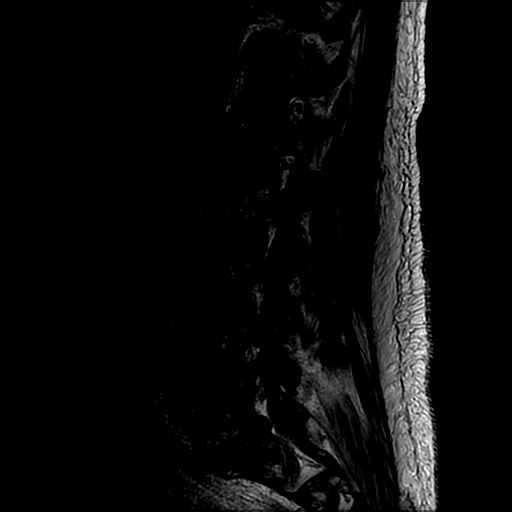
[im 7/15]
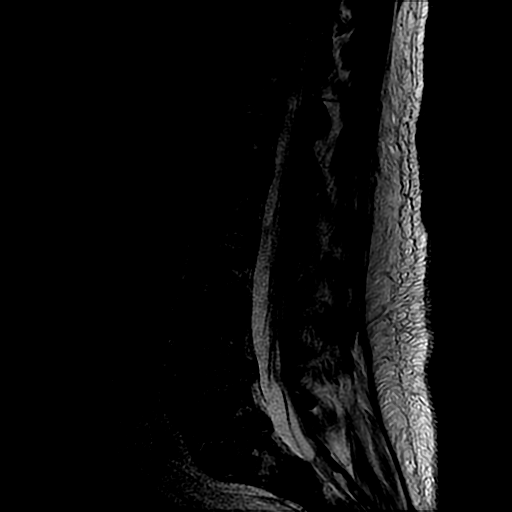
[im 8/15]
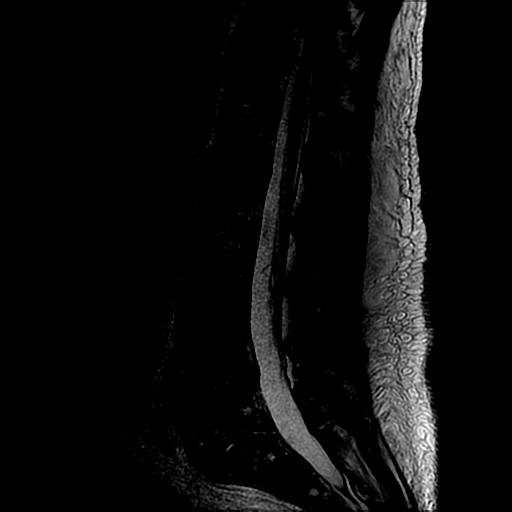
[im 9/15]
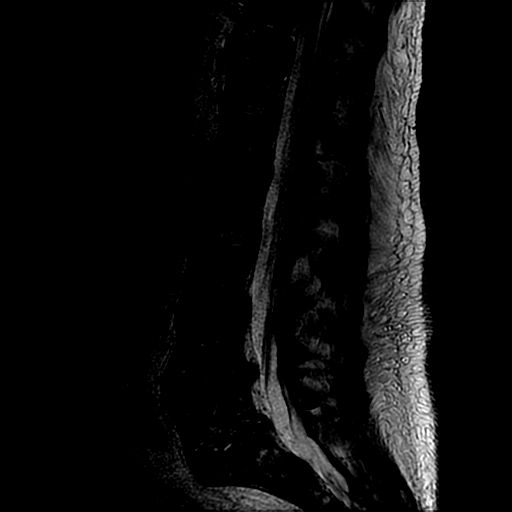
[im 11/15]
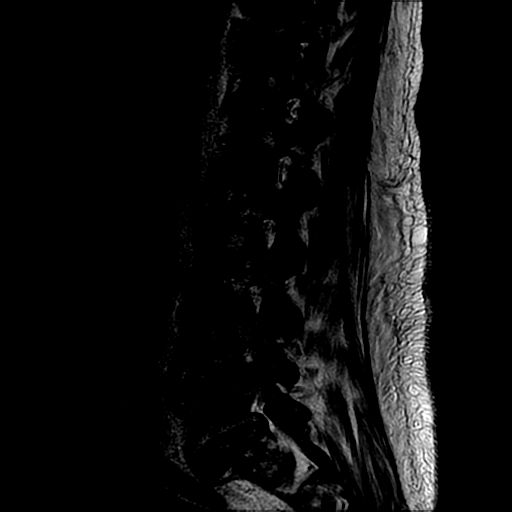
[im 13/15]
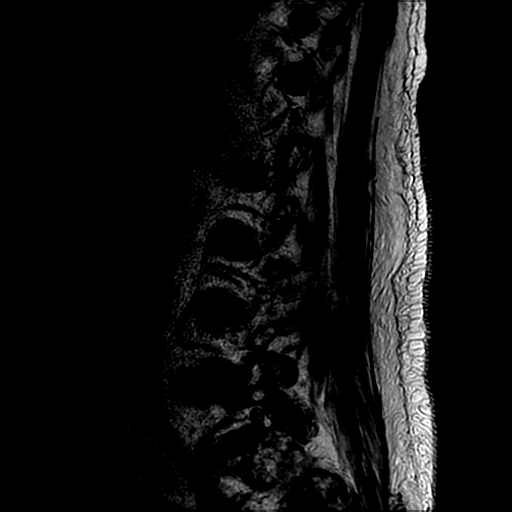
[im 15/15]
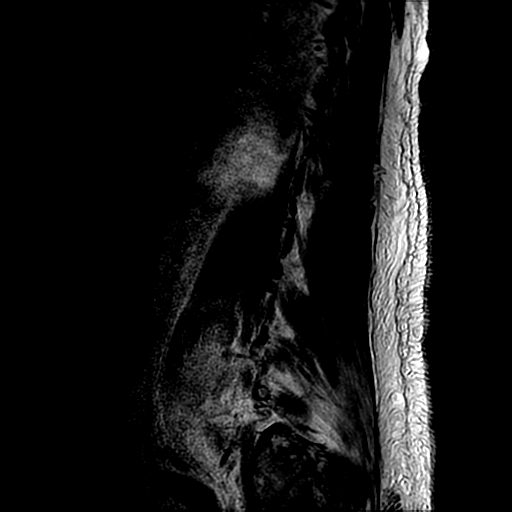

[Series 4: T1 · sagittal · 4.0mm · 0.55mm/px · 9 of 15 slices shown]
[im 1/15]
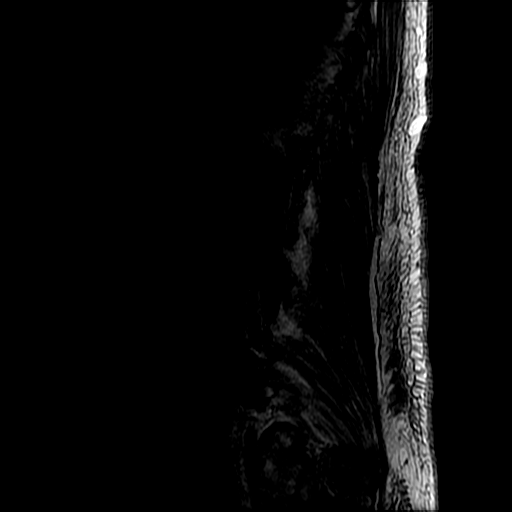
[im 3/15]
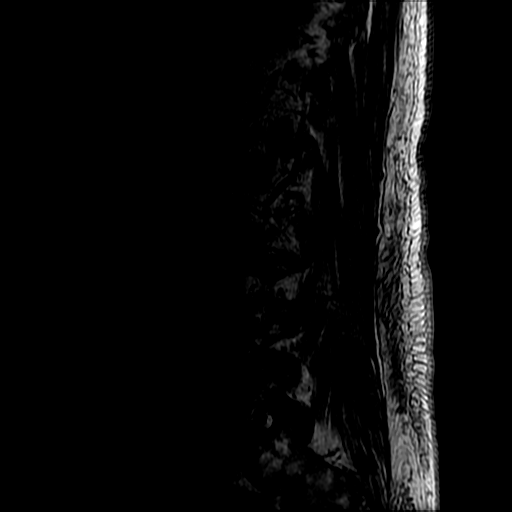
[im 5/15]
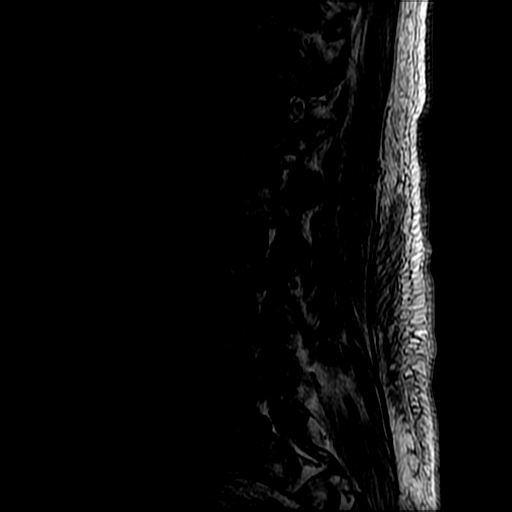
[im 7/15]
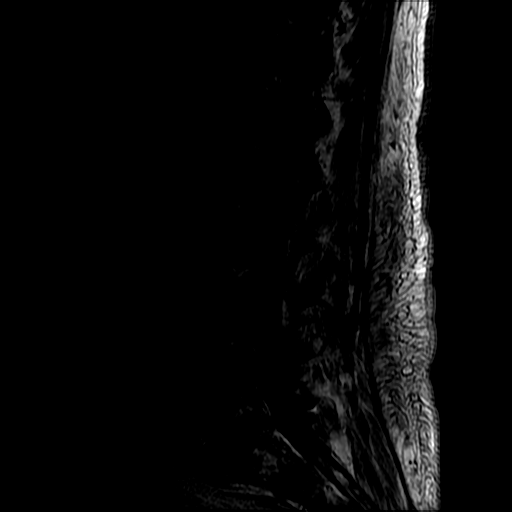
[im 8/15]
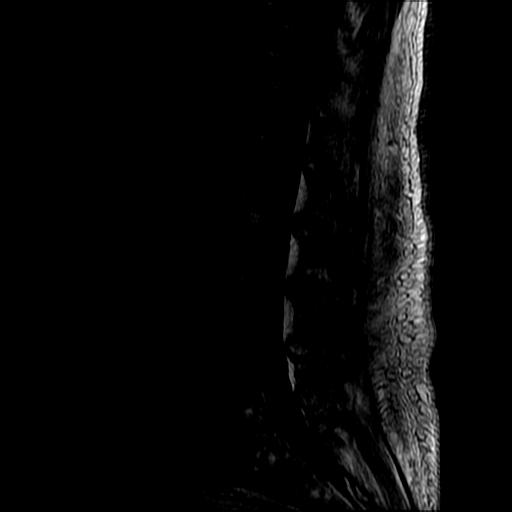
[im 9/15]
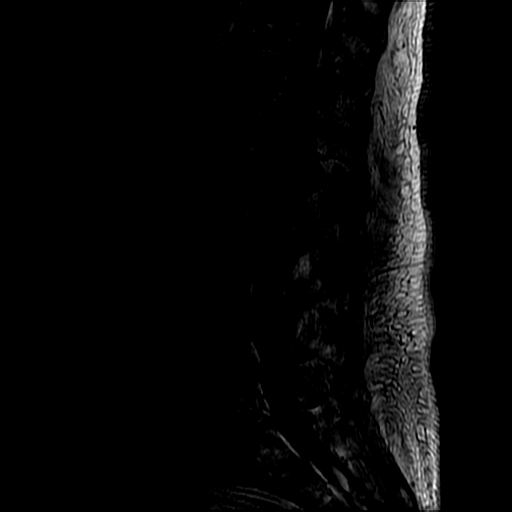
[im 11/15]
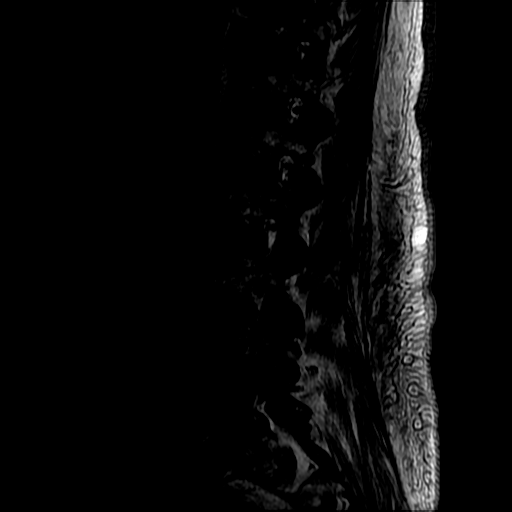
[im 13/15]
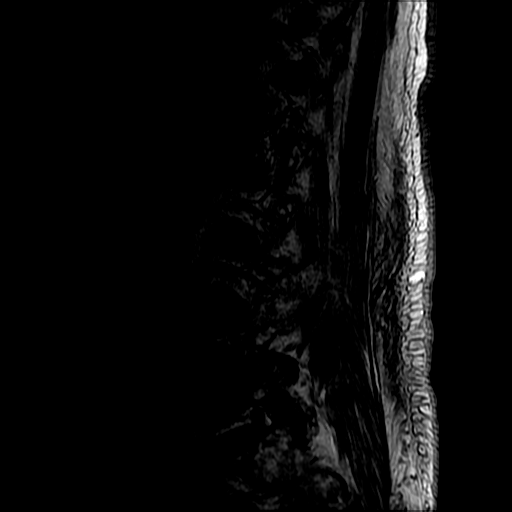
[im 15/15]
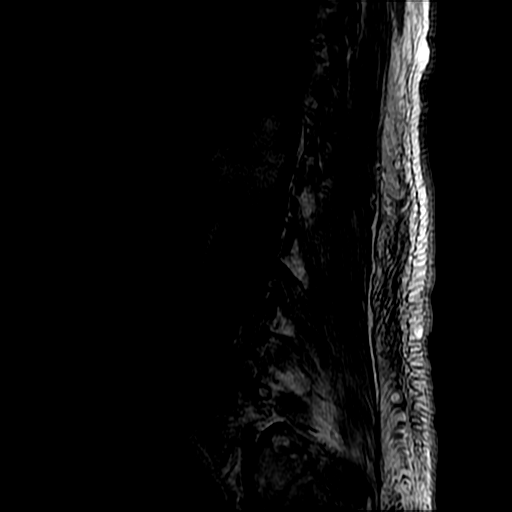

[19 of 30 positions shown; findings below may reference images not displayed]

FINDINGS: Only sagittal T1 and T2 imaging could be obtained before shortness
of breath required termination of the scan.

Segmentation:  Standard

Alignment:  Normal

Vertebrae: Abnormal hypointensity of the marrow space aside from
small islands of fatty marrow diffusely. Suspect this is from anemia
or renal osteodystrophy based on osseous findings on July 2016
abdominal CT. No acute fracture.

Conus medullaris: Extends to the L2 level and appears normal.

Paraspinal and other soft tissues: No acute abnormality noted.

Disc levels:

T12- L1: Unremarkable.

L1-L2: Mild disc narrowing and desiccation.  No impingement

L2-L3: Unremarkable.

L3-L4: Mild disc narrowing and desiccation. Mild facet spurring. No
impingement

L4-L5: Advanced disc narrowing with spurring and disc bulging.
Central disc protrusion that is small. Disc T2 hyperintensity seen
previously is diminished. Stable T1 appearance of the endplates
which are difficult to visualize due to chronic marrow changes. Disc
narrowing and bulging with ridging causes moderate bilateral
foraminal stenosis. Widely patent canal

L5-S1:Degenerative disc narrowing with spurring and ridging. Mild
bilateral foraminal stenosis. Widely patent canal.
IMPRESSION: 1. Only 2 sequences could be obtained before shortness of breath
required termination of the exam.
2. No overt discitis or change compared to August 05, 2016
comparison. When clinically able, would continue this study to
further evaluate the L4-5 disc due to disc and psoas T2
hyperintensity noted on the previous study (findings seen with
discitis or dialysis related spondyloarthropathy).
3. L4-5 moderate bilateral foraminal stenosis.
4. Abnormal marrow, favor anemia or renal osteodystrophy. Marrow
infiltration is not excluded.

## 2018-03-05 IMAGING — DX DG CHEST 2V
2 series · 2 of 2 positions shown · non-contrast
Comparison: Radiographs yesterday

CLINICAL DATA: CHF.

EXAM:
CHEST  2 VIEW

[chest pa]
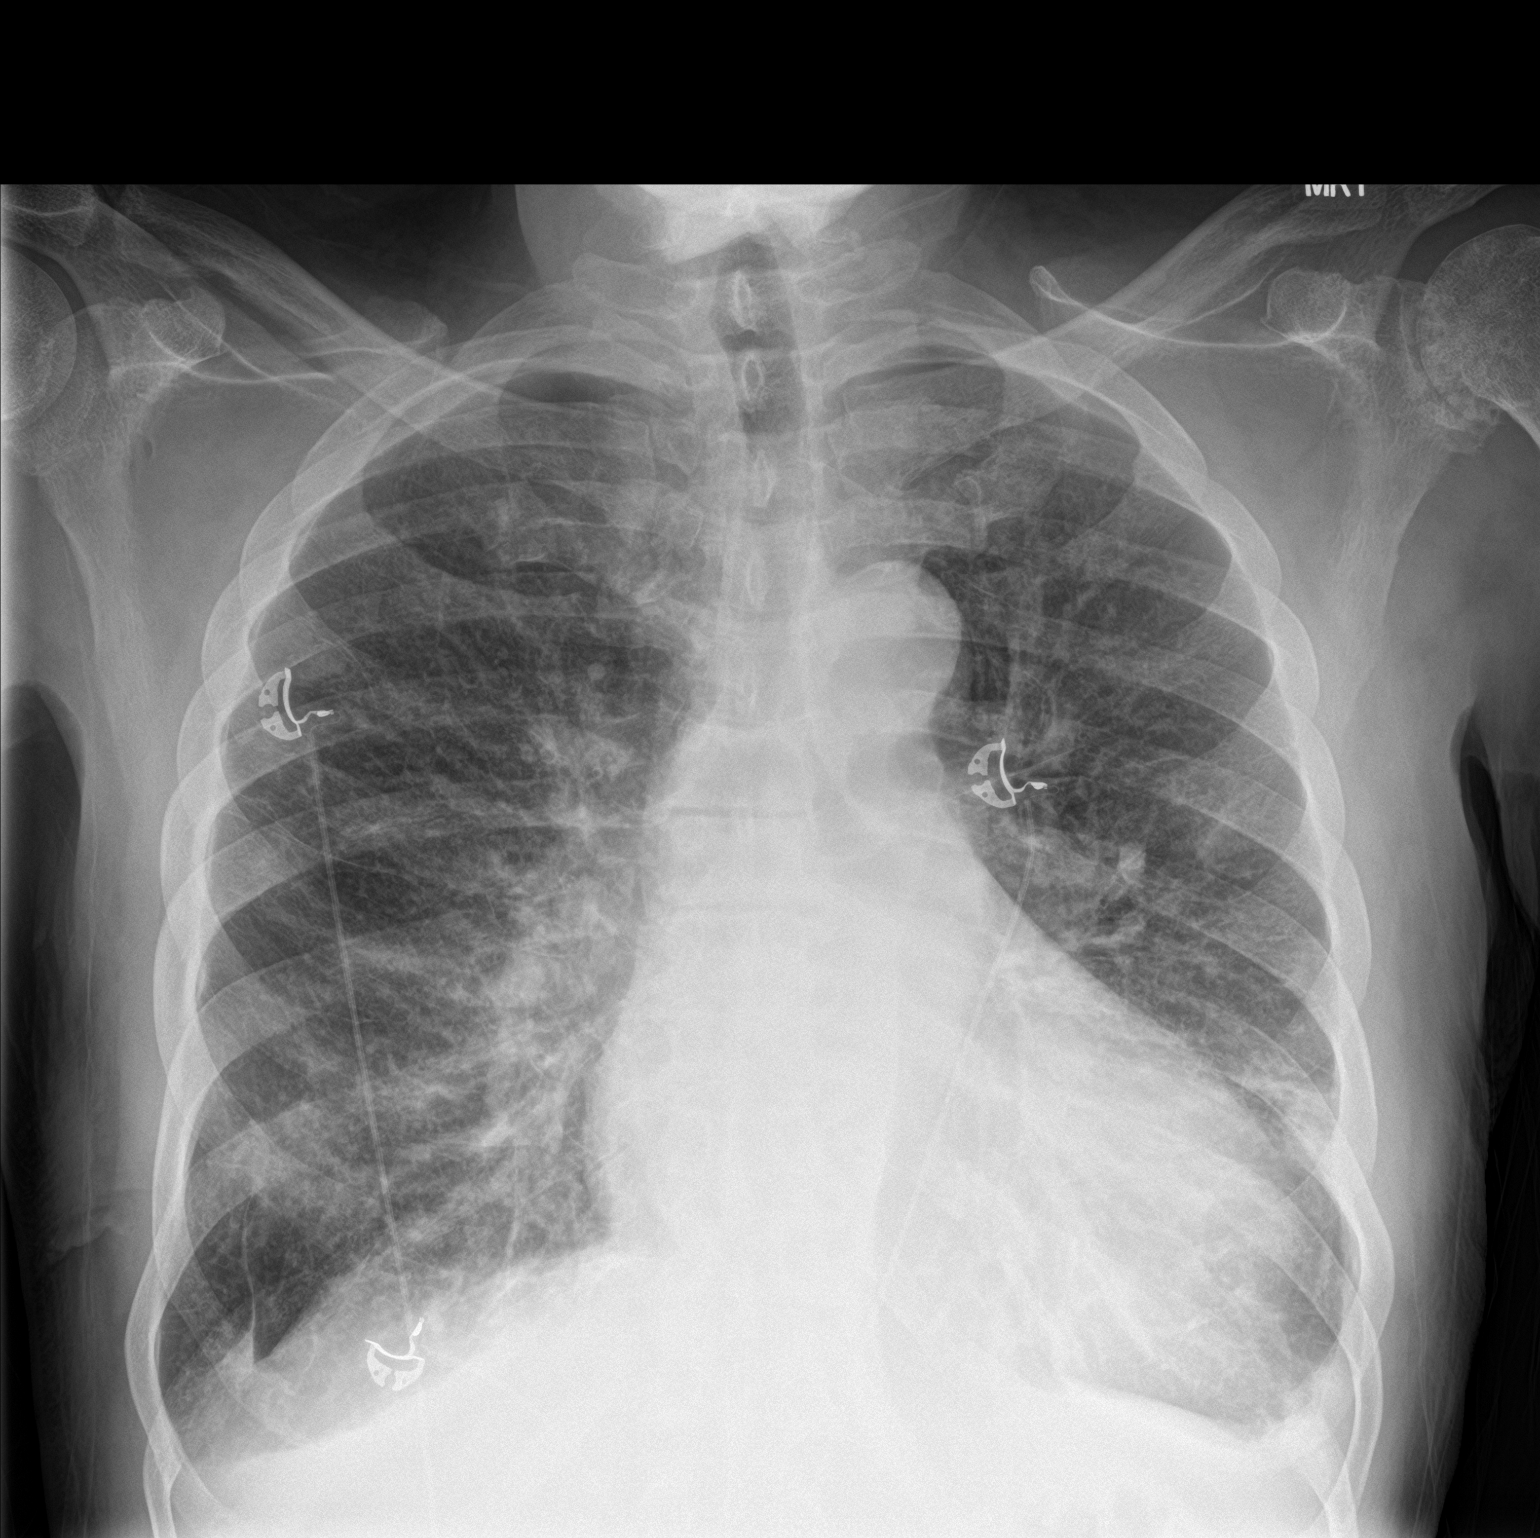

[chest lat]
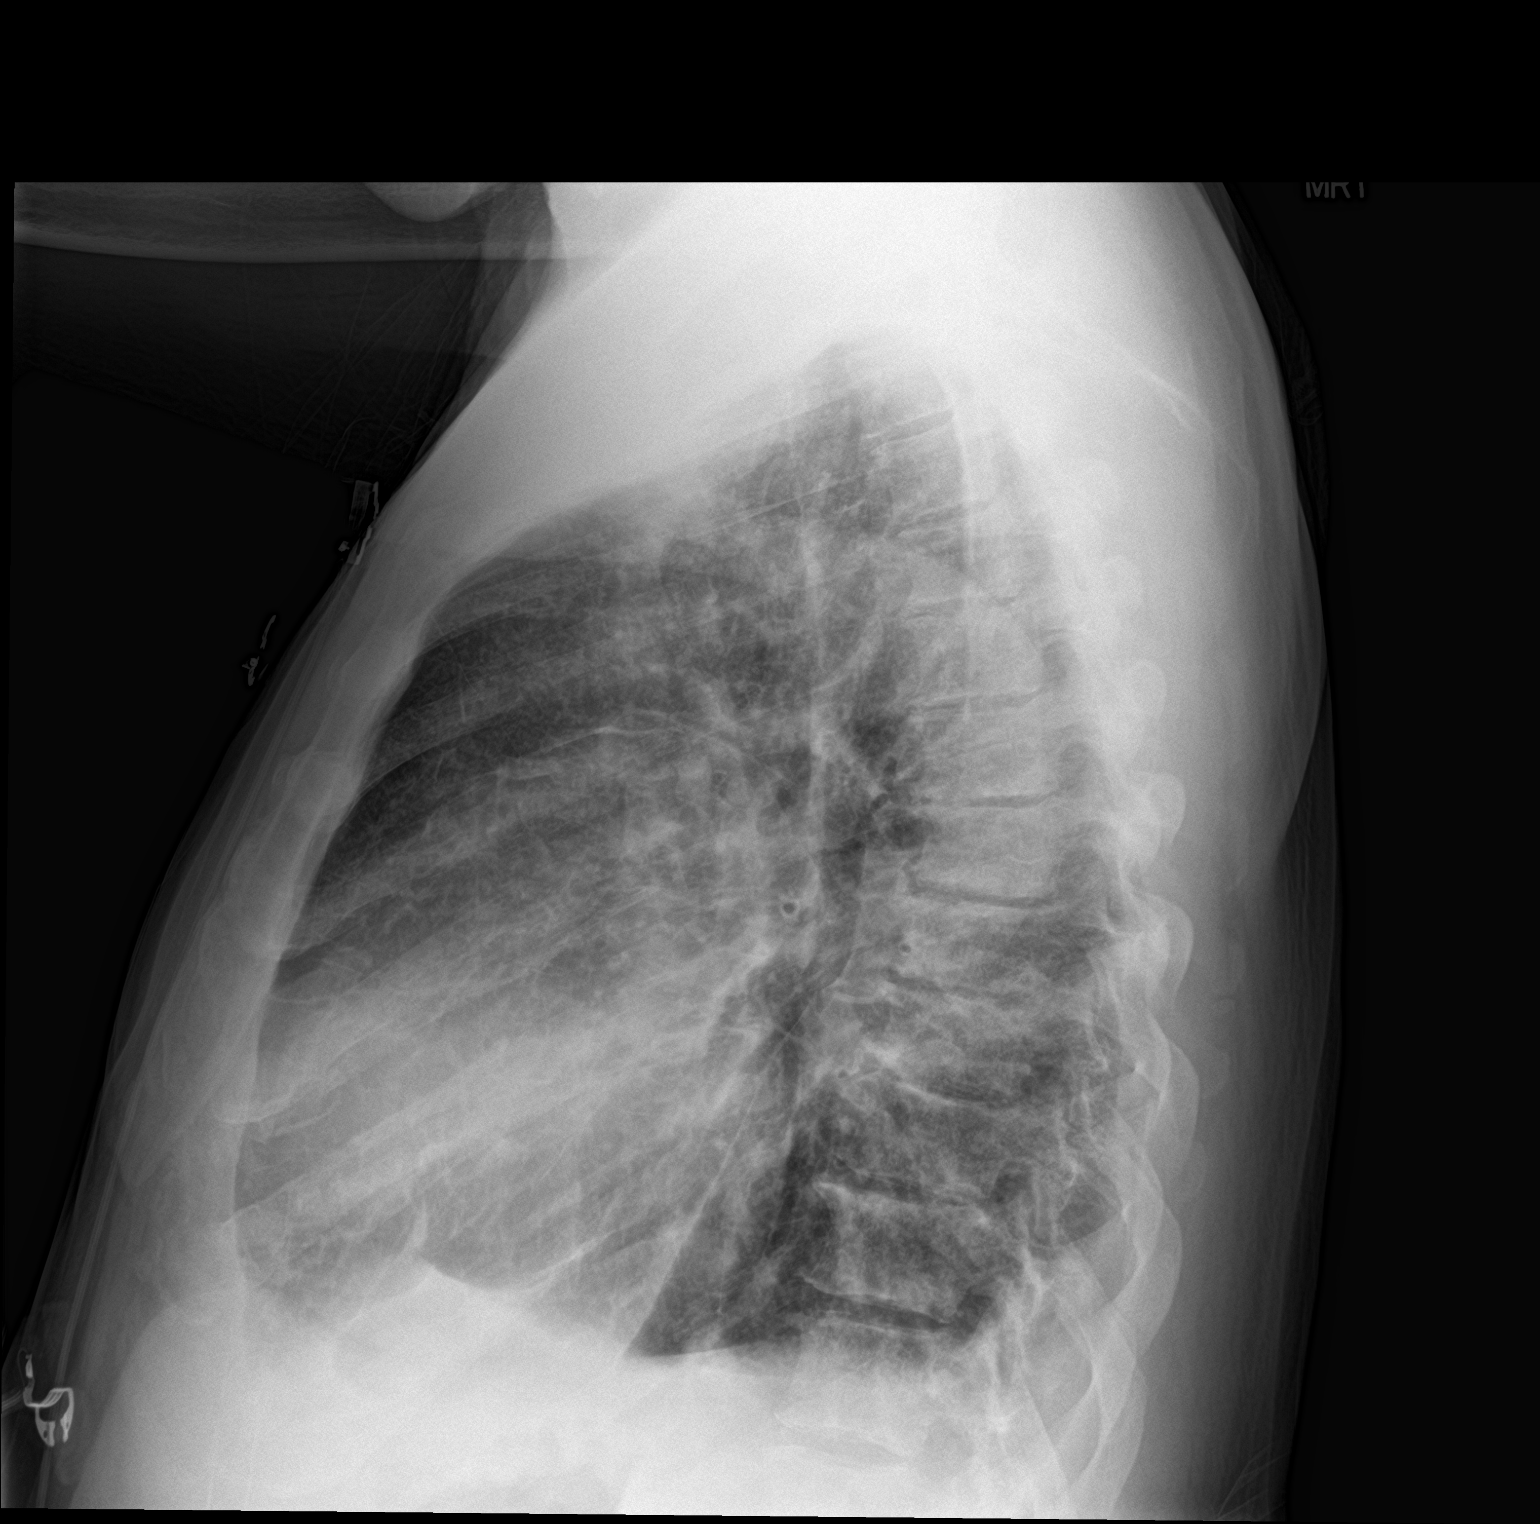

[2 of 2 positions shown; findings below may reference images not displayed]

FINDINGS: Improving lung aeration from prior. Decreasing pulmonary edema.
Small pleural effusions, similar to prior. Cardiomegaly is unchanged
allowing for differences in technique. No new airspace disease. No
pneumothorax.
IMPRESSION: Improving CHF with decreasing pulmonary edema and improving lung
aeration. Small pleural effusions persist.

## 2018-03-07 ENCOUNTER — Encounter (HOSPITAL_COMMUNITY): Payer: Self-pay | Admitting: Emergency Medicine

## 2018-03-07 ENCOUNTER — Encounter: Payer: Medicare Other | Admitting: Internal Medicine

## 2018-03-07 ENCOUNTER — Emergency Department (HOSPITAL_COMMUNITY): Payer: Medicare Other

## 2018-03-07 ENCOUNTER — Emergency Department (HOSPITAL_COMMUNITY)
Admission: EM | Admit: 2018-03-07 | Discharge: 2018-03-07 | Disposition: A | Discharge status: Home / Self Care | Payer: Medicare Other | Attending: Emergency Medicine | Admitting: Emergency Medicine

## 2018-03-07 DIAGNOSIS — R079 Chest pain, unspecified: Secondary | ICD-10-CM | POA: Diagnosis not present

## 2018-03-07 DIAGNOSIS — R0602 Shortness of breath: Secondary | ICD-10-CM | POA: Insufficient documentation

## 2018-03-07 DIAGNOSIS — Z992 Dependence on renal dialysis: Secondary | ICD-10-CM | POA: Diagnosis not present

## 2018-03-07 DIAGNOSIS — E0590 Thyrotoxicosis, unspecified without thyrotoxic crisis or storm: Secondary | ICD-10-CM | POA: Insufficient documentation

## 2018-03-07 DIAGNOSIS — Z7982 Long term (current) use of aspirin: Secondary | ICD-10-CM | POA: Diagnosis not present

## 2018-03-07 DIAGNOSIS — I120 Hypertensive chronic kidney disease with stage 5 chronic kidney disease or end stage renal disease: Secondary | ICD-10-CM | POA: Diagnosis not present

## 2018-03-07 DIAGNOSIS — N186 End stage renal disease: Secondary | ICD-10-CM | POA: Diagnosis not present

## 2018-03-07 DIAGNOSIS — R0789 Other chest pain: Secondary | ICD-10-CM | POA: Diagnosis not present

## 2018-03-07 DIAGNOSIS — Z79899 Other long term (current) drug therapy: Secondary | ICD-10-CM | POA: Insufficient documentation

## 2018-03-07 DIAGNOSIS — D631 Anemia in chronic kidney disease: Secondary | ICD-10-CM | POA: Diagnosis not present

## 2018-03-07 DIAGNOSIS — J45909 Unspecified asthma, uncomplicated: Secondary | ICD-10-CM | POA: Insufficient documentation

## 2018-03-07 DIAGNOSIS — Z21 Asymptomatic human immunodeficiency virus [HIV] infection status: Secondary | ICD-10-CM | POA: Insufficient documentation

## 2018-03-07 DIAGNOSIS — Z87891 Personal history of nicotine dependence: Secondary | ICD-10-CM | POA: Insufficient documentation

## 2018-03-07 DIAGNOSIS — M79603 Pain in arm, unspecified: Secondary | ICD-10-CM | POA: Diagnosis not present

## 2018-03-07 DIAGNOSIS — I10 Essential (primary) hypertension: Secondary | ICD-10-CM | POA: Diagnosis not present

## 2018-03-07 DIAGNOSIS — M25519 Pain in unspecified shoulder: Secondary | ICD-10-CM | POA: Diagnosis not present

## 2018-03-07 DIAGNOSIS — R52 Pain, unspecified: Secondary | ICD-10-CM | POA: Diagnosis not present

## 2018-03-07 LAB — COMPREHENSIVE METABOLIC PANEL
ALT: 10 U/L — ABNORMAL LOW (ref 17–63)
ANION GAP: 12 (ref 5–15)
AST: 15 U/L (ref 15–41)
Albumin: 2.7 g/dL — ABNORMAL LOW (ref 3.5–5.0)
Alkaline Phosphatase: 63 U/L (ref 38–126)
BUN: 66 mg/dL — ABNORMAL HIGH (ref 6–20)
CHLORIDE: 98 mmol/L — AB (ref 101–111)
CO2: 27 mmol/L (ref 22–32)
Calcium: 8.7 mg/dL — ABNORMAL LOW (ref 8.9–10.3)
Creatinine, Ser: 10.18 mg/dL — ABNORMAL HIGH (ref 0.61–1.24)
GFR calc Af Amer: 5 mL/min — ABNORMAL LOW (ref >60)
GFR, EST NON AFRICAN AMERICAN: 5 mL/min — AB (ref >60)
Glucose, Bld: 107 mg/dL — ABNORMAL HIGH (ref 65–99)
POTASSIUM: 3.6 mmol/L (ref 3.5–5.1)
Sodium: 137 mmol/L (ref 135–145)
Total Bilirubin: 0.5 mg/dL (ref 0.3–1.2)
Total Protein: 5.7 g/dL — ABNORMAL LOW (ref 6.5–8.1)

## 2018-03-07 LAB — CBC WITH DIFFERENTIAL/PLATELET
Abs Immature Granulocytes: 0 10*3/uL (ref 0.0–0.1)
Basophils Absolute: 0 10*3/uL (ref 0.0–0.1)
Basophils Relative: 0 %
Eosinophils Absolute: 0.3 10*3/uL (ref 0.0–0.7)
Eosinophils Relative: 5 %
HEMATOCRIT: 23.1 % — AB (ref 39.0–52.0)
HEMOGLOBIN: 7.3 g/dL — AB (ref 13.0–17.0)
Immature Granulocytes: 0 %
LYMPHS ABS: 0.7 10*3/uL (ref 0.7–4.0)
LYMPHS PCT: 13 %
MCH: 32 pg (ref 26.0–34.0)
MCHC: 31.6 g/dL (ref 30.0–36.0)
MCV: 101.3 fL — AB (ref 78.0–100.0)
MONOS PCT: 13 %
Monocytes Absolute: 0.7 10*3/uL (ref 0.1–1.0)
Neutro Abs: 3.6 10*3/uL (ref 1.7–7.7)
Neutrophils Relative %: 69 %
Platelets: 206 10*3/uL (ref 150–400)
RBC: 2.28 MIL/uL — AB (ref 4.22–5.81)
RDW: 19.7 % — ABNORMAL HIGH (ref 11.5–15.5)
WBC: 5.3 10*3/uL (ref 4.0–10.5)

## 2018-03-07 LAB — BRAIN NATRIURETIC PEPTIDE: B NATRIURETIC PEPTIDE 5: 663.7 pg/mL — AB (ref 0.0–100.0)

## 2018-03-07 LAB — MAGNESIUM: Magnesium: 1.5 mg/dL — ABNORMAL LOW (ref 1.7–2.4)

## 2018-03-07 IMAGING — MR MR LUMBAR SPINE W/O CM
4 of 6 series · 19 of 48 positions shown · non-contrast
Comparison: Multiple exams, including 08/05/2016 and 09/10/2016

CLINICAL DATA: Dyspnea.  Back pain.

EXAM:
MRI LUMBAR SPINE WITHOUT CONTRAST
TECHNIQUE: Multiplanar, multisequence MR imaging of the lumbar spine was
performed. No intravenous contrast was administered.

[Series 3: T2 · sagittal · 4.5mm · 0.59mm/px · 6 of 17 slices shown (1 of 2)]
[im 1/17]
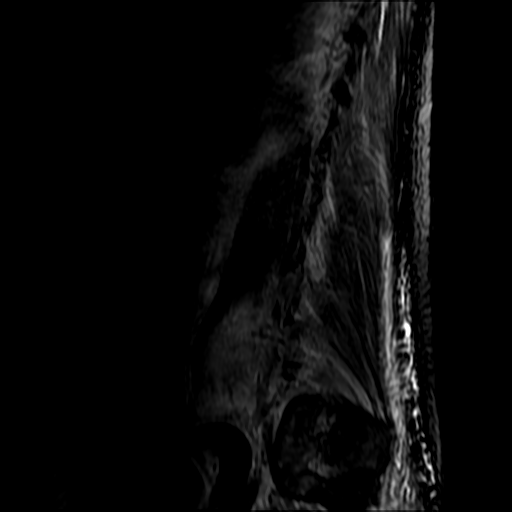
[im 4/17]
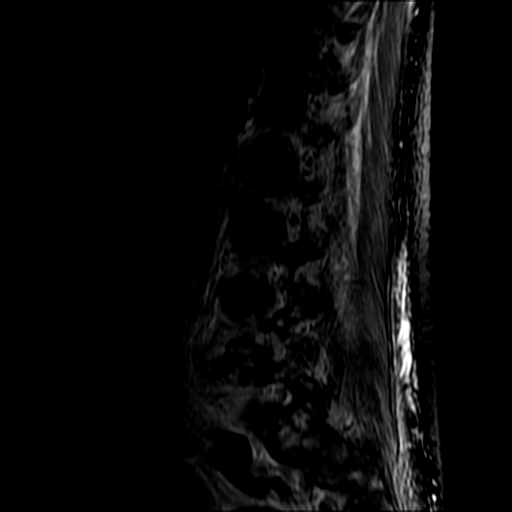
[im 7/17]
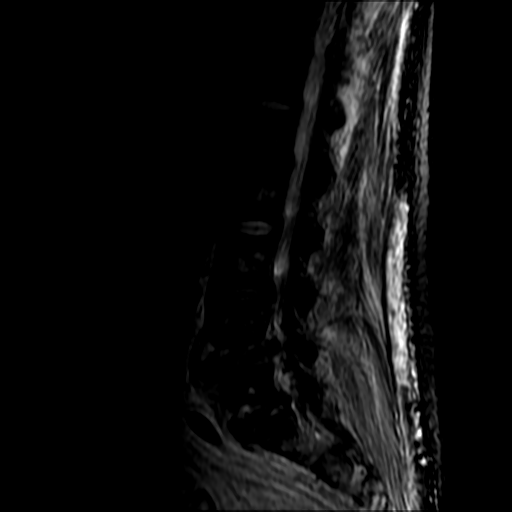
[im 10/17]
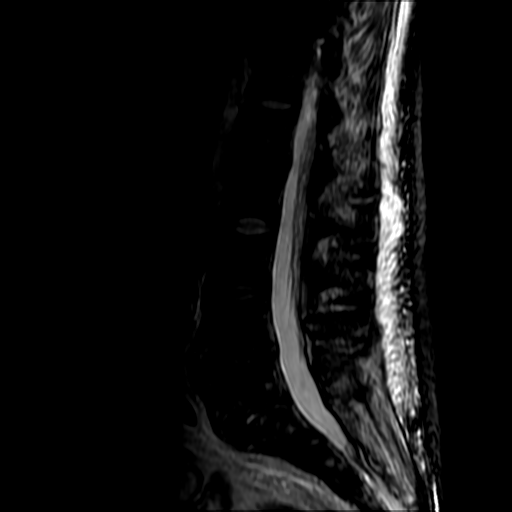
[im 13/17]
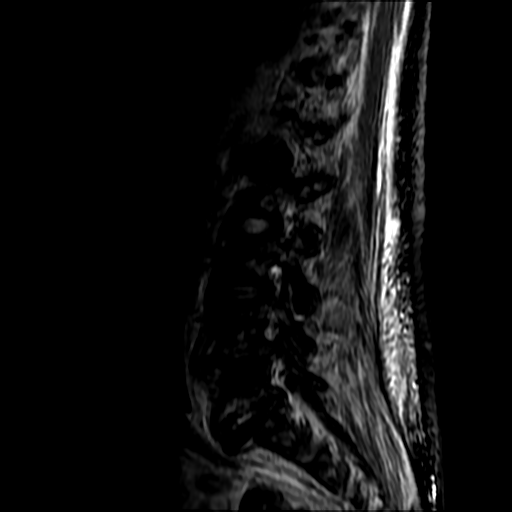
[im 17/17]
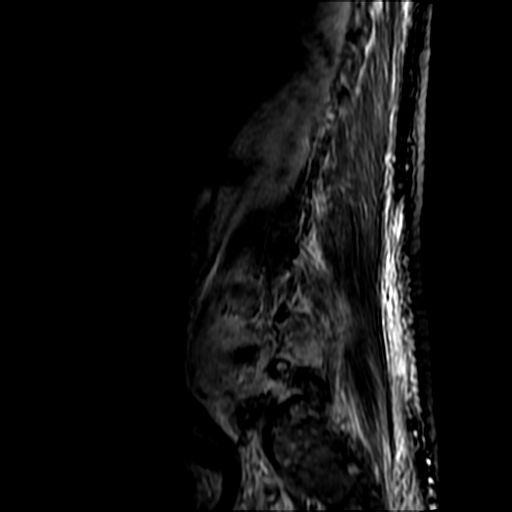

[Series 6: T1 · sagittal · 4.5mm · 0.59mm/px · 3 of 17 slices shown (1 of 2)]
[im 1/17]
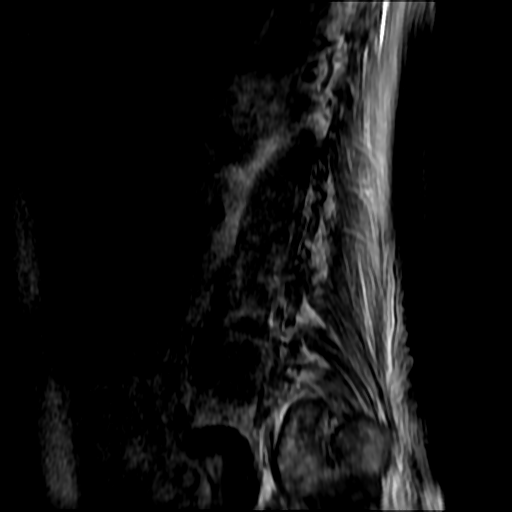
[im 9/17]
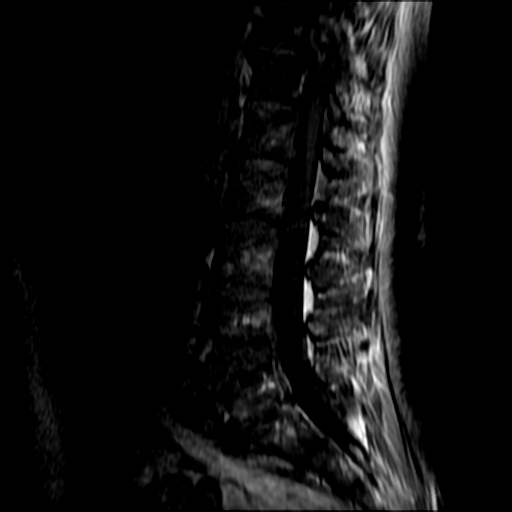
[im 17/17]
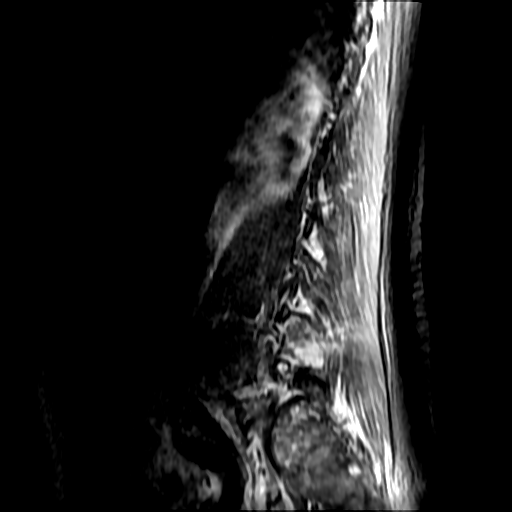

[Series 8: T2 · axial · 4.0mm · 0.43mm/px · z∈[-26,+185]mm · 8 of 46 slices shown (2 of 2)]
[im 1/46]
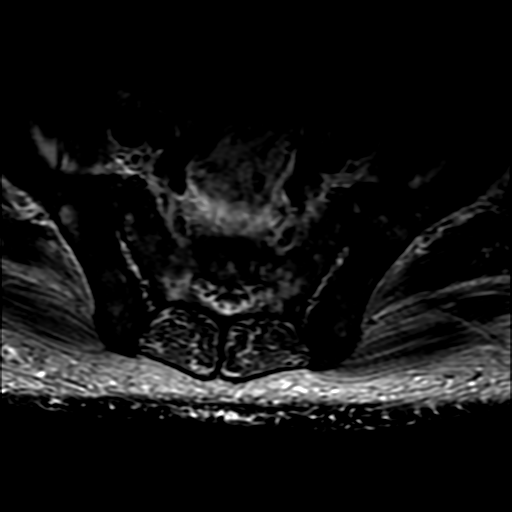
[im 7/46]
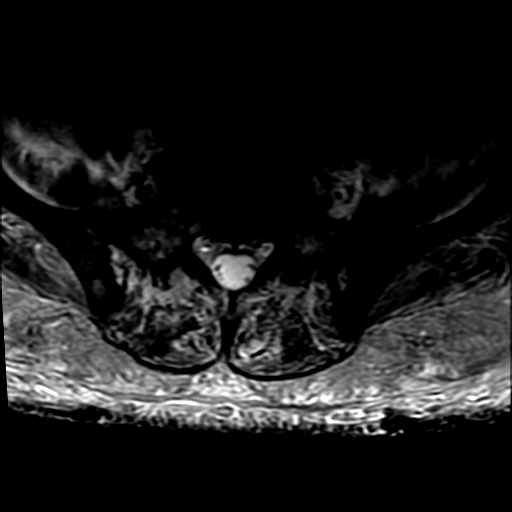
[im 13/46]
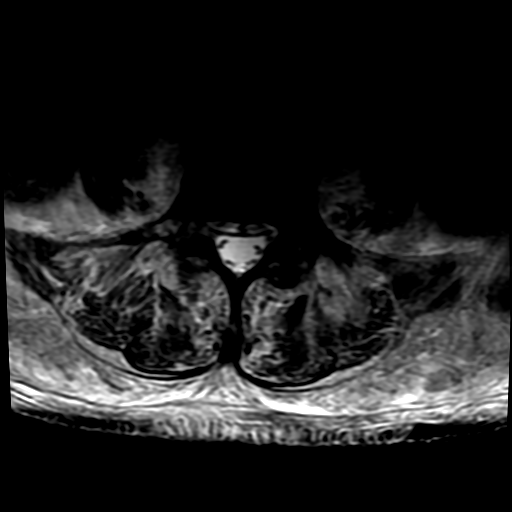
[im 20/46]
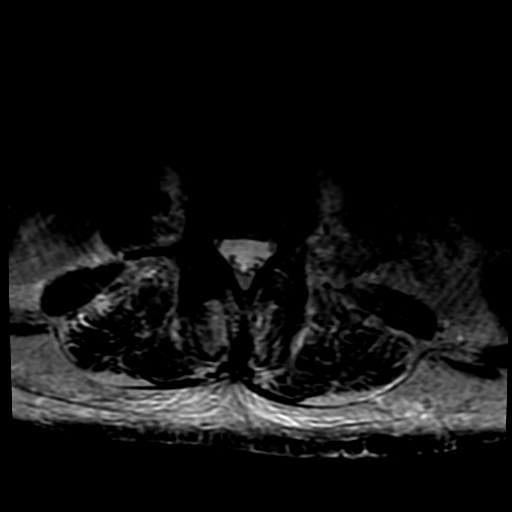
[im 23/46]
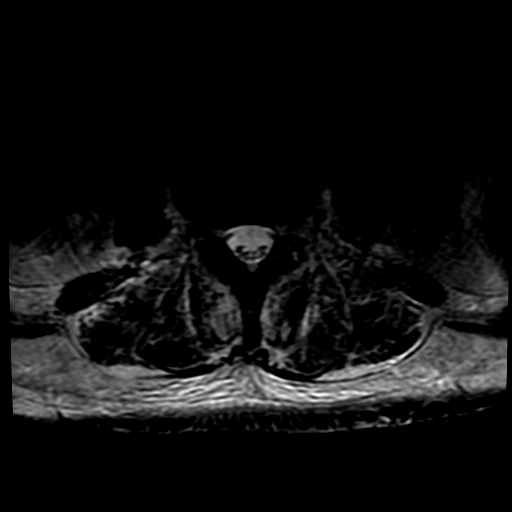
[im 26/46]
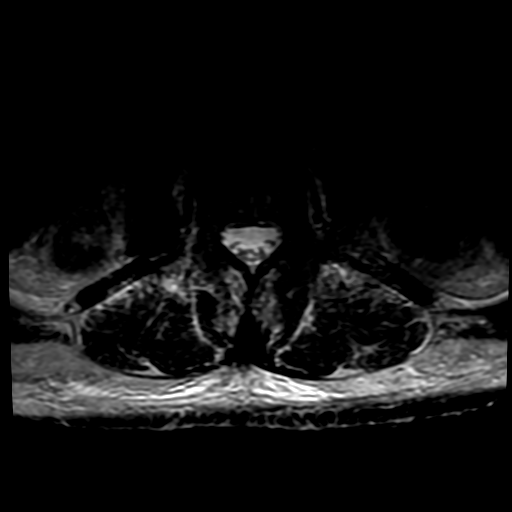
[im 33/46]
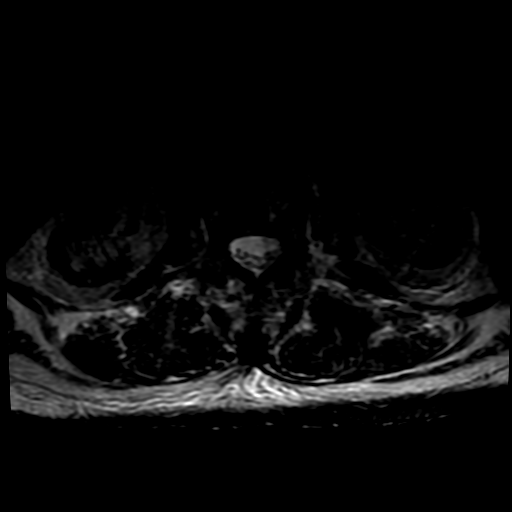
[im 39/46]
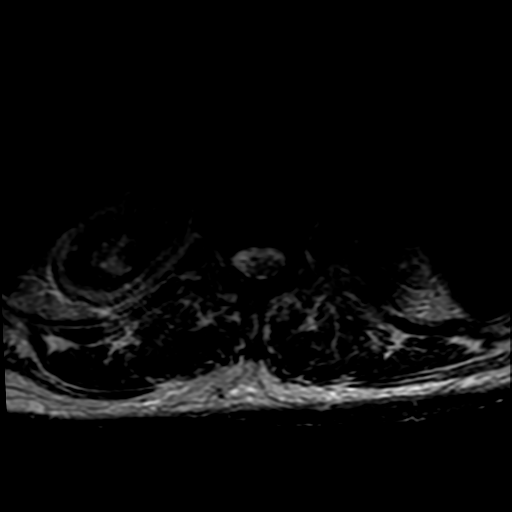

[Series 9: T1 · axial · 4.0mm · 0.86mm/px · z∈[-1,+220]mm · 2 of 6 slices shown (2 of 2)]
[im 1/6]
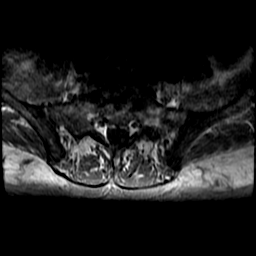
[im 6/6]
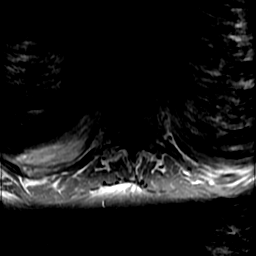

[19 of 48 positions shown; findings below may reference images not displayed]

FINDINGS: Despite efforts by the technologist and patient, motion artifact is
present on today's exam and could not be eliminated. This reduces
exam sensitivity and specificity.

Only 6 images from the axial T1 series could be obtained.

Segmentation: The lowest lumbar type non-rib-bearing vertebra is
labeled as L5.

Alignment:  No vertebral subluxation is observed.

Vertebrae: Marrow heterogeneity is present. The likely cause in this
case is chronic renal insufficiency and/ or chronic anemia. Other
causes can include marrow infiltrative processes, smoking, obesity,
or advancing age.

Degenerative endplate disease noted at L4-5 with associated spurring
poor definition of the endplates, but without the degree of T2
signal hyperintensity in the disc space to suggest discitis. There
is loss of disc height at the L4-5 and L5-S1 levels.

Conus medullaris: Extends to the L1-2 level and appears normal.

Paraspinal and other soft tissues: Diffuse third spacing of fluid in
the paraspinal, retroperitoneal, and subcutaneous soft tissues
image. Atrophic kidneys with small cysts.

Disc levels:

T12-L1: Unremarkable.

L1- 2:  Unremarkable.

L2-3:  No impingement.  Suspected minimal disc bulge.

L3-4: Mild bilateral foraminal stenosis due to disc bulge and facet
arthropathy.

L4-5: Moderate bilateral foraminal stenosis and mild displacement of
both L4 nerves in the lateral extraforaminal space along with
borderline right subarticular lateral recess stenosis due to disc
bulge, intervertebral spurring, and facet arthropathy.

L5-S1: Moderate right and mild left foraminal stenosis due to
intervertebral and facet spurring. Disc bulge with central disc
protrusion.
IMPRESSION: 1. Lumbar spondylosis and degenerative disc disease, causing
moderate impingement at L4-5 at L5-S1, and mild impingement at L3-4,
as detailed above.
2. Diffuse severe marrow heterogeneity with low T1 signal throughout
the vertebral marrow, along with mostly low T2 signal. This is
probably from chronic renal insufficiency and/or chronic anemia,
with infiltrative processes such as myelofibrosis a less likely
differential diagnostic consideration.
3. Extensive third spacing of fluid in the paraspinal,
retroperitoneal, and subcutaneous soft tissues included on today's
exam.
4. There is extensive motion artifact on today's exam, along with
only partial acquisition of the axial T1 sequence. This reduces
diagnostic sensitivity and specificity.

## 2018-03-07 MED ORDER — HYDRALAZINE HCL 20 MG/ML IJ SOLN
10.0000 mg | INTRAMUSCULAR | Status: AC
  Administered 2018-03-07: 10 mg via INTRAVENOUS
  Filled 2018-03-07: qty 1

## 2018-03-07 MED ORDER — FENTANYL CITRATE (PF) 100 MCG/2ML IJ SOLN
25.0000 ug | Freq: Once | INTRAMUSCULAR | Status: AC
  Administered 2018-03-07: 25 ug via INTRAVENOUS
  Filled 2018-03-07: qty 2

## 2018-03-07 MED ORDER — MORPHINE SULFATE 15 MG PO TABS: 15.0000 mg | ORAL_TABLET | Freq: Four times a day (QID) | ORAL | 0 refills | Status: DC | PRN

## 2018-03-07 NOTE — ED Provider Notes (Signed)
[facility] EMERGENCY DEPARTMENT Provider Note   CSN: [id number] Arrival date & time: 03/07/18  1118     History   Chief Complaint Chief Complaint  Patient presents with  . Shortness of Breath  . Arm Injury    HPI Jeffrey [name] is a 62 y.o. male.  HPI Patient presents with concern of right-sided pain. The pain is been present for about 1 week, since discharge from this facility. Pain is sore, inconsistent, moderate, typically worse with resting in a right lateral decubitus position, but not exertional, pleuritic, and predictable. No left-sided chest pain, no dyspnea. Patient has multiple medical issues including HIV, dialysis, received dialysis yesterday. He notes that he is below his typical dry weight, but otherwise no recent changes in his dialysis regimen. Minimal relief with anything, including OTC medication. Past Medical History:  Diagnosis Date  . Anemia   . Anxiety   . Arthritis   . Asthma    per pt hx  . End stage renal disease on home HD 07/10/2011   Started HD in September 2012 at [facility] with a tunneled HD catheter, now on home HD with [facility]. Dialyzing through AVF L lower arm with buttonhole technique as of mid 2014. His brother does the HD treatments at home.  They are roommates for 23 years.  The brother works 3rd shift and gets off about 8am and then puts Jeffrey [name] on HD in the morning after getting home. Most of the time he does HD about 4 times a week, for about 4 hours per treatment. Cause of ESRD was HTN according to patient. He says he let his health go and ending up with complications, and that he didn't like seeing doctors in those days.  He says he was diagnosed with severe HTN when he lived in [place] in his 60's.   . Hepatitis B carrier ([place])   . HIV infection ([place])   . Hypertension   . Hyperthyroidism    normal now  . Pneumonia several yrs ago  . Seizure ([place])   . Seizures ([place]) 06/02/2011   x 1 none since  . Thrombocytopenia  [facility])     Patient Active Problem List   Diagnosis Date Noted  . Malnutrition of moderate degree 02/23/2018  . Seizure ([place]) 02/22/2018  . Encounter for central line placement   . Lactic acidosis   . Acute uremia   . Mass of duodenum   . Anemia of chronic kidney failure, unspecified stage   . Infectious joint disease 10/04/2017  . Symptomatic anemia 09/23/2017  . Antibiotic long-term use   . Septic arthritis ([place]) 09/15/2017  . Encounter for incision and drainage procedure 09/15/2017  . Effusion of left knee 09/08/2017  . Intertrigo 07/09/2017  . AC joint arthropathy 06/16/2017  . Pseudoaneurysm of arteriovenous dialysis fistula ([place]) 12/23/2016  . Eczema 12/10/2016  . Bilateral low back pain without sciatica   . Aortic valve insufficiency   . Fall 09/08/2016  . Knee abrasion, right, initial encounter 09/08/2016  . Lumbosacral spondylosis without myelopathy 08/03/2016  . Pulmonary nodules 08/03/2016  . Essential hypertension   . Anemia of chronic disease 04/08/2016  . Secondary hyperparathyroidism ([place]) 02/09/2014  . Asthma 02/09/2014  . Nuclear sclerotic cataract of both eyes 12/12/2013  . Myopia with presbyopia of both eyes 10/26/2013  . COPD (chronic obstructive pulmonary disease) ([place]) 06/27/2013  . Thrombocytopenia ([place]) 01/09/2013  . HBV (hepatitis B virus) infection 01/09/2013  . Anxiety 07/22/2011  . End stage  renal disease on home HD 07/10/2011  . HIV disease ([place]) 06/17/2011    Past Surgical History:  Procedure Laterality Date  . AV FISTULA PLACEMENT  06/02/11   Left radiocephalic AVF  . BIOPSY  02/17/2018   Procedure: BIOPSY;  Surgeon: Milus Banister, MD;  Location: [facility] ENDOSCOPY;  Service: Endoscopy;;  . COLONOSCOPY    . COLONOSCOPY WITH PROPOFOL N/A 01/27/2018   Procedure: COLONOSCOPY WITH PROPOFOL;  Surgeon: [name], MD;  Location: [facility] ENDOSCOPY;  Service: Gastroenterology;  Laterality: N/A;  . CORONARY ANGIOPLASTY    . ESOPHAGOGASTRODUODENOSCOPY  N/A 02/17/2018   Procedure: ESOPHAGOGASTRODUODENOSCOPY (EGD);  Surgeon: Milus Banister, MD;  Location: [facility];  Service: Endoscopy;  Laterality: N/A;  . ESOPHAGOGASTRODUODENOSCOPY (EGD) WITH PROPOFOL N/A 01/27/2018   Procedure: ESOPHAGOGASTRODUODENOSCOPY (EGD) WITH PROPOFOL;  Surgeon: [name], MD;  Location: [facility]COPY;  Service: Gastroenterology;  Laterality: N/A;  . EUS N/A 02/17/2018   Procedure: UPPER ENDOSCOPIC ULTRASOUND (EUS) RADIAL;  Surgeon: Milus Banister, MD;  Location: [facility] ENDOSCOPY;  Service: Endoscopy;  Laterality: N/A;  . fistulaogram     x 2 last 2 years  . IR FLUORO GUIDE CV LINE RIGHT  09/16/2017  . IR US GUIDE VASC ACCESS RIGHT  09/16/2017  . IRRIGATION AND DEBRIDEMENT KNEE Left 09/15/2017   Procedure: IRRIGATION AND DEBRIDEMENT KNEE; arthroscopic clean out;  Surgeon: [name], MD;  Location: [facility] ORS;  Service: Orthopedics;  Laterality: Left;  . OTHER SURGICAL HISTORY     removal temporary HD catheter   . REVISON OF ARTERIOVENOUS FISTULA Left 10/10/2015   Procedure: REVISON OF LEFT RADIOCEPHALIC ARTERIOVENOUS FISTULA;  Surgeon: Angelia Mould, MD;  Location: [facility];  Service: Vascular;  Laterality: Left;  . REVISON OF ARTERIOVENOUS FISTULA Left 02/07/2016   Procedure: REPAIR OF PSEUDO-ANEUREYSM OF LEFT ARM  ARTERIOVENOUS FISTULA;  Surgeon: Angelia Mould, MD;  Location: [facility];  Service: Vascular;  Laterality: Left;  . REVISON OF ARTERIOVENOUS FISTULA Left 0/06/2724   Procedure: PLICATION OF LEFT ARM RADIOCEPHALIC ARTERIOVENOUS FISTULA PSEUDOANEURYSM;  Surgeon: Angelia Mould, MD;  Location: [facility];  Service: Vascular;  Laterality: Left;  . RIGHT/LEFT HEART CATH AND CORONARY ANGIOGRAPHY N/A 02/17/2017   Procedure: Right/Left Heart Cath and Coronary Angiography;  Surgeon: Sherren Mocha, MD;  Location: [facility] [facility] CV LAB;  Service: Cardiovascular;  Laterality: N/A;  . TEE WITHOUT CARDIOVERSION N/A 09/11/2016   Procedure: TRANSESOPHAGEAL  ECHOCARDIOGRAM (TEE);  Surgeon: Dorothy Spark, MD;  Location[facility] END[facility]COPY;  Service: Cardiovascular;  Laterality: N/A;        Home Medications    Prior to Admission medications   Medication Sig Start Date End Date Taking? Authorizing Provider  albuterol (PROVENTIL) (2.5 MG/3ML) 0.083% nebulizer solution Take 2.5 mg by nebulization every 6 (six) hours as needed for wheezing or shortness of breath.   Yes [provider]  ALPRAZolam (XANAX) 0.25 MG tablet Take 0.25 mg by mouth 3 (three) times daily as needed.  11/29/17  Yes [provider]  aspirin 81 MG chewable tablet Chew 1 tablet (81 mg total) by mouth daily. 10/15/17  Yes Vaughan Basta, MD  bictegravir-emtricitabine-tenofovir AF (BIKTARVY) 50-200-25 MG TABS tablet Take 1 tablet by mouth daily. 12/27/17  Yes Campbell Riches, MD  calcium acetate (PHOSLO) 667 MG capsule Take 667-2,001 mg by mouth See admin instructions. 1,334-2,001 mg three times a day with each meal and 667-1,334 mg with each snack 06/06/15  Yes [provider]  carvedilol (COREG) 25 MG tablet Take 1 tablet (25  mg total) by mouth 2 (two) times daily with a meal. 10/05/17  Yes Alphonzo Grieve, MD  cloNIDine (CATAPRES - DOSED IN MG/24 HR) 0.1 mg/24hr patch UNWRAP AND APPLY 1 PATCH TO SKIN EVERY 7 DAYS 01/28/18  Yes Velna Ochs, MD  clotrimazole-betamethasone (LOTRISONE) cream Apply 1 application topically 2 (two) times daily. Patient taking differently: Apply 1 application topically 2 (two) times daily as needed (irritation).  09/08/17  Yes Campbell Riches, MD  doxazosin (CARDURA) 4 MG tablet Take 4 mg by mouth at bedtime.  07/04/17  Yes [provider]  epoetin alfa (EPOGEN,PROCRIT) 2000 UNIT/ML injection Inject 2,000 Units into the vein 4 (four) times a week.    Yes [provider]  ethyl chloride spray Apply 1 application topically daily as needed (HD).  05/20/15  Yes [provider]  heparin 1000 UNIT/ML  injection Inject 3,000 Units into the vein 4 (four) times a week.    Yes [provider]  hydrALAZINE (APRESOLINE) 25 MG tablet Take 3 tablets (75 mg total) by mouth every 8 (eight) hours. 02/28/18  Yes Kathi Ludwig, MD  iron sucrose (VENOFER) 20 MG/ML injection Inject 100 mg into the vein once a week. On Monday   Yes [provider]  megestrol (MEGACE) 40 MG tablet Take 2 tablets (80 mg total) by mouth 2 (two) times daily. 02/28/18  Yes Kathi Ludwig, MD  multivitamin (RENA-VIT) TABS tablet Take 1 tablet by mouth daily.     Yes [provider]  traZODone (DESYREL) 50 MG tablet TAKE 1 TABLET(50 MG) BY MOUTH AT BEDTIME AS NEEDED FOR SLEEP 01/31/18  Yes Campbell Riches, MD  VOLTAREN 1 % GEL APPLY 2 GRAMS EXTERNALLY TO THE AFFECTED AREA FOUR TIMES DAILY Patient taking differently: Apply 2 g topically 3 (three) times daily as needed (pain).  09/08/17  Yes Campbell Riches, MD  omeprazole (PRILOSEC) 20 MG capsule Take 1 capsule (20 mg total) by mouth daily. Patient not taking: Reported on 02/21/2018 01/31/18   [name], MD    Family History Family History  Problem Relation Age of Onset  . Hypertension Mother   . Diabetes Mother   . Cancer Father     Social History Social History   Tobacco Use  . Smoking status: Former Smoker    Years: 10.00    Types: Cigarettes    Last attempt to quit: 08/08/2015    Years since quitting: 2.5  . Smokeless tobacco: Never Used  . Tobacco comment: casual smoking for 10 years  Substance Use Topics  . Alcohol use: No    Alcohol/week: 0.0 oz    Comment: quit 2004  . Drug use: No    Comment: uds (+) cocaine in 08/2011 but pt states was taking sudafed at the time     Allergies   Lisinopril and Penicillins   Review of Systems Review of Systems  Constitutional:       Per HPI, otherwise negative  HENT:       Per HPI, otherwise negative  Respiratory:       Per HPI, otherwise negative  Cardiovascular:        Per HPI, otherwise negative  Gastrointestinal: Negative for vomiting.  Endocrine:       Negative aside from HPI  Genitourinary:       Neg aside from HPI   Musculoskeletal:       Per HPI, otherwise negative  Skin: Negative.   Allergic/Immunologic: Positive for immunocompromised state.  Neurological: Negative for syncope.  Physical Exam Updated Vital Signs BP (!) 170/74   Pulse 82 Comment: Simultaneous filing. User may not have seen previous data.  Temp 97.6 F (36.4 C)   Resp (!) 21 Comment: Simultaneous filing. User may not have seen previous data.  SpO2 100% Comment: Simultaneous filing. User may not have seen previous data.  Physical Exam  Constitutional: He is oriented to person, place, and time. He appears well-developed. No distress.  HENT:  Head: Normocephalic and atraumatic.  Eyes: Conjunctivae and EOM are normal.  Cardiovascular: Normal rate and regular rhythm.  Pulmonary/Chest: Effort normal. No stridor. No respiratory distress.  Abdominal: He exhibits no distension.  Musculoskeletal: He exhibits no edema.  Neurological: He is alert and oriented to person, place, and time.  Skin: Skin is warm and dry.  Psychiatric: He has a normal mood and affect.  Nursing note and vitals reviewed.    ED Treatments / Results  Labs (all labs ordered are listed, but only abnormal results are displayed) Labs Reviewed  COMPREHENSIVE METABOLIC PANEL - Abnormal; Notable for the following components:      Result Value   Chloride 98 (*)    Glucose, Bld 107 (*)    BUN 66 (*)    Creatinine, Ser 10.18 (*)    Calcium 8.7 (*)    Total Protein 5.7 (*)    Albumin 2.7 (*)    ALT 10 (*)    GFR calc non Af Amer 5 (*)    GFR calc Af Amer 5 (*)    All other components within normal limits  BRAIN NATRIURETIC PEPTIDE - Abnormal; Notable for the following components:   B Natriuretic Peptide 663.7 (*)    All other components within normal limits  CBC WITH DIFFERENTIAL/PLATELET -  Abnormal; Notable for the following components:   RBC 2.28 (*)    Hemoglobin 7.3 (*)    HCT 23.1 (*)    MCV 101.3 (*)    RDW 19.7 (*)    All other components within normal limits  MAGNESIUM - Abnormal; Notable for the following components:   Magnesium 1.5 (*)    All other components within normal limits    EKG EKG Interpretation  Date/Time:  Monday March 07 2018 11:23:48 EDT Ventricular Rate:  85 PR Interval:    QRS Duration: 103 QT Interval:  398 QTC Calculation: 474 R Axis:   -166 Text Interpretation:  Right and left arm electrode reversal, interpretation assumes no reversal Sinus rhythm Ventricular premature complex Borderline prolonged PR interval Probable lateral infarct, age indeterminate ST-t wave abnormality Abnormal ekg Confirmed by Carmin Muskrat [phone]) on 03/07/2018 1:14:53 PM   Radiology Dg Chest Port 1 View  Result Date: 03/07/2018 CLINICAL DATA:  Shortness of breath.  Right arm pain. EXAM: PORTABLE CHEST 1 VIEW COMPARISON:  02/22/2018 FINDINGS: 1130 hours. The cardio pericardial silhouette is enlarged. Right IJ central line is been removed in the interval. There is pulmonary vascular congestion without overt pulmonary edema. Tiny left pleural effusion noted. Interstitial markings are diffusely coarsened with chronic features. The visualized bony structures of the thorax are intact. Telemetry leads overlie the chest. IMPRESSION: 1. Cardiomegaly with vascular congestion. 2. Interval removal right IJ central line. 3. Tiny left pleural effusion. Electronically Signed   By: Misty Stanley M.D.   On: 03/07/2018 11:44    Procedures Procedures (including critical care time)  Medications Ordered in ED Medications  hydrALAZINE (APRESOLINE) injection 10 mg (10 mg Intravenous Given 03/07/18 1420)  fentaNYL (SUBLIMAZE) injection 25 mcg (25 mcg  Intravenous Given 03/07/18 1424)     Initial Impression / Assessment and Plan / ED Course  I have reviewed the triage vital signs and  the nursing notes.  Pertinent labs & imaging results that were available during my care of the patient were reviewed by me and considered in my medical decision making (see chart for details).  On repeat exam the patient is awake alert, in no distress, actually resting in his right lateral decubitus position, seemingly without discomfort.  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  Update:, Patient's labs now back, generally reassuring, x-ray reassuring, no evidence for pneumonia, with no substantial lab abnormalities, unremarkable x-ray, and no distress, the patient was started on a course of medication, for his pain control pending outpatient follow-up for his removal of possible malignancy, which may be contributing to his pain, though this would be an atypical presentation. However, given the after mentioned reassuring findings, the patient is appropriate for discharge with outpatient follow-up and was provided return precautions as well.  Final Clinical Impressions(s) / ED Diagnoses   Final diagnoses:  Right-sided chest pain    ED Discharge Orders        Ordered    morphine (MSIR) 15 MG tablet  Every 6 hours PRN     03/07/18 1529       Carmin Muskrat, MD 03/07/18 1609

## 2018-03-07 NOTE — ED Notes (Signed)
Doctor at bedside.

## 2018-03-07 NOTE — Discharge Instructions (Signed)
As discussed, your evaluation today has been largely reassuring.  But, it is important that you monitor your condition carefully, and do not hesitate to return to the ED if you develop new, or concerning changes in your condition. ? ?Otherwise, please follow-up with your physician for appropriate ongoing care. ? ?

## 2018-03-07 NOTE — ED Triage Notes (Signed)
Pt here from with c/o htn right arm and shoulder pain along with some sob associated with the pain from his arm  , pt does home dialysis

## 2018-03-07 NOTE — ED Notes (Signed)
Family member arrived and at bedside.

## 2018-03-08 DIAGNOSIS — Z992 Dependence on renal dialysis: Secondary | ICD-10-CM | POA: Diagnosis not present

## 2018-03-08 DIAGNOSIS — N186 End stage renal disease: Secondary | ICD-10-CM | POA: Diagnosis not present

## 2018-03-08 DIAGNOSIS — D631 Anemia in chronic kidney disease: Secondary | ICD-10-CM | POA: Diagnosis not present

## 2018-03-08 MED FILL — ETHYL CHLORIDE SPRAY: 25 days supply | Qty: 104 | Fill #3

## 2018-03-10 DIAGNOSIS — D631 Anemia in chronic kidney disease: Secondary | ICD-10-CM | POA: Diagnosis not present

## 2018-03-10 DIAGNOSIS — Z992 Dependence on renal dialysis: Secondary | ICD-10-CM | POA: Diagnosis not present

## 2018-03-10 DIAGNOSIS — N186 End stage renal disease: Secondary | ICD-10-CM | POA: Diagnosis not present

## 2018-03-14 ENCOUNTER — Ambulatory Visit: Payer: Medicare Other | Admitting: Cardiovascular Disease

## 2018-03-14 DIAGNOSIS — I252 Old myocardial infarction: Secondary | ICD-10-CM | POA: Diagnosis not present

## 2018-03-14 DIAGNOSIS — K269 Duodenal ulcer, unspecified as acute or chronic, without hemorrhage or perforation: Secondary | ICD-10-CM | POA: Diagnosis not present

## 2018-03-14 DIAGNOSIS — G9341 Metabolic encephalopathy: Secondary | ICD-10-CM | POA: Diagnosis not present

## 2018-03-14 DIAGNOSIS — D631 Anemia in chronic kidney disease: Secondary | ICD-10-CM | POA: Diagnosis present

## 2018-03-14 DIAGNOSIS — R918 Other nonspecific abnormal finding of lung field: Secondary | ICD-10-CM | POA: Diagnosis not present

## 2018-03-14 DIAGNOSIS — I499 Cardiac arrhythmia, unspecified: Secondary | ICD-10-CM | POA: Diagnosis not present

## 2018-03-14 DIAGNOSIS — D135 Benign neoplasm of extrahepatic bile ducts: Secondary | ICD-10-CM | POA: Diagnosis present

## 2018-03-14 DIAGNOSIS — I6783 Posterior reversible encephalopathy syndrome: Secondary | ICD-10-CM | POA: Insufficient documentation

## 2018-03-14 DIAGNOSIS — G319 Degenerative disease of nervous system, unspecified: Secondary | ICD-10-CM | POA: Diagnosis not present

## 2018-03-14 DIAGNOSIS — I517 Cardiomegaly: Secondary | ICD-10-CM | POA: Diagnosis not present

## 2018-03-14 DIAGNOSIS — I674 Hypertensive encephalopathy: Secondary | ICD-10-CM | POA: Diagnosis present

## 2018-03-14 DIAGNOSIS — E889 Metabolic disorder, unspecified: Secondary | ICD-10-CM | POA: Diagnosis not present

## 2018-03-14 DIAGNOSIS — R000 Tachycardia, unspecified: Secondary | ICD-10-CM | POA: Diagnosis not present

## 2018-03-14 DIAGNOSIS — B20 Human immunodeficiency virus [HIV] disease: Secondary | ICD-10-CM | POA: Diagnosis not present

## 2018-03-14 DIAGNOSIS — Z992 Dependence on renal dialysis: Secondary | ICD-10-CM | POA: Diagnosis not present

## 2018-03-14 DIAGNOSIS — N186 End stage renal disease: Secondary | ICD-10-CM | POA: Diagnosis present

## 2018-03-14 DIAGNOSIS — J90 Pleural effusion, not elsewhere classified: Secondary | ICD-10-CM | POA: Diagnosis not present

## 2018-03-14 DIAGNOSIS — E162 Hypoglycemia, unspecified: Secondary | ICD-10-CM | POA: Diagnosis present

## 2018-03-14 DIAGNOSIS — B1910 Unspecified viral hepatitis B without hepatic coma: Secondary | ICD-10-CM | POA: Diagnosis not present

## 2018-03-14 DIAGNOSIS — Z880 Allergy status to penicillin: Secondary | ICD-10-CM | POA: Diagnosis not present

## 2018-03-14 DIAGNOSIS — I10 Essential (primary) hypertension: Secondary | ICD-10-CM | POA: Insufficient documentation

## 2018-03-14 DIAGNOSIS — G9340 Encephalopathy, unspecified: Secondary | ICD-10-CM | POA: Diagnosis not present

## 2018-03-14 DIAGNOSIS — I214 Non-ST elevation (NSTEMI) myocardial infarction: Secondary | ICD-10-CM | POA: Diagnosis not present

## 2018-03-14 DIAGNOSIS — I248 Other forms of acute ischemic heart disease: Secondary | ICD-10-CM | POA: Diagnosis present

## 2018-03-14 DIAGNOSIS — D132 Benign neoplasm of duodenum: Secondary | ICD-10-CM | POA: Diagnosis not present

## 2018-03-14 DIAGNOSIS — Z8669 Personal history of other diseases of the nervous system and sense organs: Secondary | ICD-10-CM | POA: Diagnosis not present

## 2018-03-14 DIAGNOSIS — R410 Disorientation, unspecified: Secondary | ICD-10-CM | POA: Diagnosis not present

## 2018-03-14 DIAGNOSIS — K838 Other specified diseases of biliary tract: Secondary | ICD-10-CM | POA: Diagnosis not present

## 2018-03-14 DIAGNOSIS — D490 Neoplasm of unspecified behavior of digestive system: Secondary | ICD-10-CM | POA: Diagnosis not present

## 2018-03-14 DIAGNOSIS — I120 Hypertensive chronic kidney disease with stage 5 chronic kidney disease or end stage renal disease: Secondary | ICD-10-CM | POA: Diagnosis present

## 2018-03-14 DIAGNOSIS — G92 Toxic encephalopathy: Secondary | ICD-10-CM | POA: Diagnosis present

## 2018-03-14 DIAGNOSIS — K3189 Other diseases of stomach and duodenum: Secondary | ICD-10-CM | POA: Diagnosis not present

## 2018-03-14 DIAGNOSIS — Z21 Asymptomatic human immunodeficiency virus [HIV] infection status: Secondary | ICD-10-CM | POA: Diagnosis present

## 2018-03-14 DIAGNOSIS — I161 Hypertensive emergency: Secondary | ICD-10-CM | POA: Diagnosis not present

## 2018-03-14 DIAGNOSIS — Z4659 Encounter for fitting and adjustment of other gastrointestinal appliance and device: Secondary | ICD-10-CM | POA: Diagnosis not present

## 2018-03-14 DIAGNOSIS — Z87891 Personal history of nicotine dependence: Secondary | ICD-10-CM | POA: Diagnosis not present

## 2018-03-14 DIAGNOSIS — Z9889 Other specified postprocedural states: Secondary | ICD-10-CM | POA: Diagnosis not present

## 2018-03-14 DIAGNOSIS — R4182 Altered mental status, unspecified: Secondary | ICD-10-CM | POA: Diagnosis not present

## 2018-03-15 DIAGNOSIS — I214 Non-ST elevation (NSTEMI) myocardial infarction: Secondary | ICD-10-CM | POA: Insufficient documentation

## 2018-03-17 MED ORDER — OXYCODONE HCL 5 MG PO TABS
5.00 | ORAL_TABLET | ORAL | Status: DC
Start: ? — End: 2018-03-17

## 2018-03-17 MED ORDER — ONDANSETRON 4 MG PO TBDP
4.00 | ORAL_TABLET | ORAL | Status: DC
Start: ? — End: 2018-03-17

## 2018-03-17 MED ORDER — ALPRAZOLAM 0.5 MG PO TABS
0.25 | ORAL_TABLET | ORAL | Status: DC
Start: ? — End: 2018-03-17

## 2018-03-17 MED ORDER — HEPARIN SODIUM (PORCINE) 5000 UNIT/ML IJ SOLN
5000.00 | INTRAMUSCULAR | Status: DC
Start: 2018-03-21 — End: 2018-03-17

## 2018-03-17 MED ORDER — CLONIDINE 0.1 MG/24HR TD PTWK
1.00 | MEDICATED_PATCH | TRANSDERMAL | Status: DC
Start: 2018-03-22 — End: 2018-03-17

## 2018-03-17 MED ORDER — B COMPLEX PO TABS
1.00 | ORAL_TABLET | ORAL | Status: DC
Start: 2018-03-22 — End: 2018-03-17

## 2018-03-17 MED ORDER — POLYETHYLENE GLYCOL 3350 17 G PO PACK
17.00 | PACK | ORAL | Status: DC
Start: ? — End: 2018-03-17

## 2018-03-17 MED ORDER — HYDRALAZINE HCL 100 MG PO TABS
100.00 | ORAL_TABLET | ORAL | Status: DC
Start: 2018-03-21 — End: 2018-03-17

## 2018-03-17 MED ORDER — DEXTROSE-NACL 5-0.9 % IV SOLN
50.00 | INTRAVENOUS | Status: DC
Start: ? — End: 2018-03-17

## 2018-03-17 MED ORDER — DOLUTEGRAVIR SODIUM 50 MG PO TABS
50.00 | ORAL_TABLET | ORAL | Status: DC
Start: 2018-03-22 — End: 2018-03-17

## 2018-03-17 MED ORDER — CLEVIDIPINE 25 MG/50ML IV EMUL
0.00 | INTRAVENOUS | Status: DC
Start: ? — End: 2018-03-17

## 2018-03-17 MED ORDER — CALCIUM ACETATE (PHOS BINDER) 667 MG PO CAPS
2001.00 | ORAL_CAPSULE | ORAL | Status: DC
Start: 2018-03-18 — End: 2018-03-17

## 2018-03-17 MED ORDER — PANTOPRAZOLE SODIUM 20 MG PO TBEC
20.00 | DELAYED_RELEASE_TABLET | ORAL | Status: DC
Start: 2018-03-22 — End: 2018-03-17

## 2018-03-17 MED ORDER — ALBUTEROL SULFATE (2.5 MG/3ML) 0.083% IN NEBU
2.50 | INHALATION_SOLUTION | RESPIRATORY_TRACT | Status: DC
Start: ? — End: 2018-03-17

## 2018-03-17 MED ORDER — AMLODIPINE BESYLATE 10 MG PO TABS
10.00 | ORAL_TABLET | ORAL | Status: DC
Start: 2018-03-22 — End: 2018-03-17

## 2018-03-17 MED ORDER — MEGESTROL ACETATE 40 MG PO TABS
80.00 | ORAL_TABLET | ORAL | Status: DC
Start: 2018-03-21 — End: 2018-03-17

## 2018-03-17 MED ORDER — ACETAMINOPHEN 325 MG PO TABS
650.00 | ORAL_TABLET | ORAL | Status: DC
Start: ? — End: 2018-03-17

## 2018-03-17 MED ORDER — ASPIRIN 81 MG PO CHEW
81.00 | CHEWABLE_TABLET | ORAL | Status: DC
Start: 2018-03-22 — End: 2018-03-17

## 2018-03-17 MED ORDER — CARVEDILOL 6.25 MG PO TABS
25.00 | ORAL_TABLET | ORAL | Status: DC
Start: 2018-03-21 — End: 2018-03-17

## 2018-03-17 MED ORDER — TERAZOSIN HCL 5 MG PO CAPS
5.00 | ORAL_CAPSULE | ORAL | Status: DC
Start: 2018-03-21 — End: 2018-03-17

## 2018-03-17 MED ORDER — EMTRICITABINE-TENOFOVIR AF 200-25 MG PO TABS
1.00 | ORAL_TABLET | ORAL | Status: DC
Start: 2018-03-22 — End: 2018-03-17

## 2018-03-21 HISTORY — PX: ERCP: SHX60

## 2018-03-21 MED ORDER — SODIUM CHLORIDE 0.9 % IV SOLN
200.00 | INTRAVENOUS | Status: DC
Start: ? — End: 2018-03-21

## 2018-03-21 MED ORDER — MELATONIN 3 MG PO TABS
3.00 | ORAL_TABLET | ORAL | Status: DC
Start: ? — End: 2018-03-21

## 2018-03-21 MED ORDER — GENERIC EXTERNAL MEDICATION
Status: DC
Start: ? — End: 2018-03-21

## 2018-03-21 MED ORDER — ALBUMIN HUMAN 25 % IV SOLN
25.00 | INTRAVENOUS | Status: DC
Start: ? — End: 2018-03-21

## 2018-03-21 MED ORDER — INDOMETHACIN 50 MG RE SUPP
RECTAL | Status: DC
Start: ? — End: 2018-03-21

## 2018-03-21 MED ORDER — BUTALBITAL-APAP-CAFFEINE 50-325-40 MG PO TABS
1.00 | ORAL_TABLET | ORAL | Status: DC
Start: ? — End: 2018-03-21

## 2018-03-21 MED ORDER — CALCIUM ACETATE (PHOS BINDER) 667 MG PO CAPS
1334.00 | ORAL_CAPSULE | ORAL | Status: DC
Start: 2018-03-22 — End: 2018-03-21

## 2018-03-22 ENCOUNTER — Ambulatory Visit: Payer: Medicare Other

## 2018-03-22 MED ORDER — GENERIC EXTERNAL MEDICATION
Status: DC
Start: ? — End: 2018-03-22

## 2018-03-25 ENCOUNTER — Other Ambulatory Visit: Payer: Self-pay | Admitting: Internal Medicine

## 2018-03-25 DIAGNOSIS — M6281 Muscle weakness (generalized): Secondary | ICD-10-CM | POA: Diagnosis not present

## 2018-03-25 DIAGNOSIS — I120 Hypertensive chronic kidney disease with stage 5 chronic kidney disease or end stage renal disease: Secondary | ICD-10-CM | POA: Diagnosis not present

## 2018-03-25 DIAGNOSIS — D631 Anemia in chronic kidney disease: Secondary | ICD-10-CM | POA: Diagnosis not present

## 2018-03-25 DIAGNOSIS — Z992 Dependence on renal dialysis: Secondary | ICD-10-CM | POA: Diagnosis not present

## 2018-03-25 DIAGNOSIS — R2689 Other abnormalities of gait and mobility: Secondary | ICD-10-CM | POA: Diagnosis not present

## 2018-03-25 DIAGNOSIS — N186 End stage renal disease: Secondary | ICD-10-CM | POA: Diagnosis not present

## 2018-03-25 DIAGNOSIS — I214 Non-ST elevation (NSTEMI) myocardial infarction: Secondary | ICD-10-CM | POA: Diagnosis not present

## 2018-03-25 DIAGNOSIS — N2581 Secondary hyperparathyroidism of renal origin: Secondary | ICD-10-CM | POA: Diagnosis not present

## 2018-03-25 DIAGNOSIS — D509 Iron deficiency anemia, unspecified: Secondary | ICD-10-CM | POA: Diagnosis not present

## 2018-03-25 NOTE — Telephone Encounter (Signed)
Patient has not followed up since his hospital discharge on 6/10. Please have him schedule an appointment in [facility] asap. I will approve 2 week supply, after which I cannot approve any more refills until he is seen. Thank you.

## 2018-03-28 DIAGNOSIS — N186 End stage renal disease: Secondary | ICD-10-CM | POA: Diagnosis not present

## 2018-03-28 DIAGNOSIS — Z992 Dependence on renal dialysis: Secondary | ICD-10-CM | POA: Diagnosis not present

## 2018-03-28 DIAGNOSIS — R2689 Other abnormalities of gait and mobility: Secondary | ICD-10-CM | POA: Diagnosis not present

## 2018-03-28 DIAGNOSIS — I120 Hypertensive chronic kidney disease with stage 5 chronic kidney disease or end stage renal disease: Secondary | ICD-10-CM | POA: Diagnosis not present

## 2018-03-28 DIAGNOSIS — N2581 Secondary hyperparathyroidism of renal origin: Secondary | ICD-10-CM | POA: Diagnosis not present

## 2018-03-28 DIAGNOSIS — D509 Iron deficiency anemia, unspecified: Secondary | ICD-10-CM | POA: Diagnosis not present

## 2018-03-28 DIAGNOSIS — I214 Non-ST elevation (NSTEMI) myocardial infarction: Secondary | ICD-10-CM | POA: Diagnosis not present

## 2018-03-28 DIAGNOSIS — D631 Anemia in chronic kidney disease: Secondary | ICD-10-CM | POA: Diagnosis not present

## 2018-03-28 DIAGNOSIS — M6281 Muscle weakness (generalized): Secondary | ICD-10-CM | POA: Diagnosis not present

## 2018-03-29 DIAGNOSIS — D509 Iron deficiency anemia, unspecified: Secondary | ICD-10-CM | POA: Diagnosis not present

## 2018-03-29 DIAGNOSIS — Z992 Dependence on renal dialysis: Secondary | ICD-10-CM | POA: Diagnosis not present

## 2018-03-29 DIAGNOSIS — D631 Anemia in chronic kidney disease: Secondary | ICD-10-CM | POA: Diagnosis not present

## 2018-03-29 DIAGNOSIS — N186 End stage renal disease: Secondary | ICD-10-CM | POA: Diagnosis not present

## 2018-03-29 DIAGNOSIS — N2581 Secondary hyperparathyroidism of renal origin: Secondary | ICD-10-CM | POA: Diagnosis not present

## 2018-03-30 ENCOUNTER — Telehealth: Payer: Self-pay | Admitting: Internal Medicine

## 2018-03-30 DIAGNOSIS — H43811 Vitreous degeneration, right eye: Secondary | ICD-10-CM | POA: Diagnosis not present

## 2018-03-30 DIAGNOSIS — H52223 Regular astigmatism, bilateral: Secondary | ICD-10-CM | POA: Diagnosis not present

## 2018-03-30 DIAGNOSIS — H25813 Combined forms of age-related cataract, bilateral: Secondary | ICD-10-CM | POA: Diagnosis not present

## 2018-03-30 DIAGNOSIS — Z992 Dependence on renal dialysis: Secondary | ICD-10-CM | POA: Diagnosis not present

## 2018-03-30 DIAGNOSIS — H5213 Myopia, bilateral: Secondary | ICD-10-CM | POA: Diagnosis not present

## 2018-03-30 DIAGNOSIS — D631 Anemia in chronic kidney disease: Secondary | ICD-10-CM | POA: Diagnosis not present

## 2018-03-30 DIAGNOSIS — N186 End stage renal disease: Secondary | ICD-10-CM | POA: Diagnosis not present

## 2018-03-30 DIAGNOSIS — N2581 Secondary hyperparathyroidism of renal origin: Secondary | ICD-10-CM | POA: Diagnosis not present

## 2018-03-30 DIAGNOSIS — D509 Iron deficiency anemia, unspecified: Secondary | ICD-10-CM | POA: Diagnosis not present

## 2018-03-30 NOTE — Telephone Encounter (Signed)
Return call to [facility] - requesting PT order for "2 times a week x 4 weeks for reconditioning- strengthening, balance and ambulation training" - states pt was recently discharged from [facility] . VO given - if not appropriate, let me know.

## 2018-03-30 NOTE — Telephone Encounter (Signed)
Yes that sounds good. Thank you. Agree with VO.

## 2018-03-30 NOTE — Telephone Encounter (Signed)
MARK CALLED AND NEEDS VERBAL ORDERS FOR THIS PATIENT FOR PT, CALL [place] AT [phone]

## 2018-03-31 ENCOUNTER — Other Ambulatory Visit: Payer: Self-pay | Admitting: Internal Medicine

## 2018-03-31 ENCOUNTER — Encounter (HOSPITAL_COMMUNITY): Payer: Self-pay | Admitting: Emergency Medicine

## 2018-03-31 ENCOUNTER — Inpatient Hospital Stay (HOSPITAL_COMMUNITY)
Admission: EM | Admit: 2018-03-31 | Discharge: 2018-04-04 | DRG: 919 | Disposition: A | Discharge status: Home / Self Care | Payer: Medicare Other | Attending: Internal Medicine | Admitting: Internal Medicine

## 2018-03-31 ENCOUNTER — Other Ambulatory Visit: Payer: Self-pay

## 2018-03-31 DIAGNOSIS — Z833 Family history of diabetes mellitus: Secondary | ICD-10-CM

## 2018-03-31 DIAGNOSIS — Y848 Other medical procedures as the cause of abnormal reaction of the patient, or of later complication, without mention of misadventure at the time of the procedure: Secondary | ICD-10-CM | POA: Diagnosis present

## 2018-03-31 DIAGNOSIS — K91840 Postprocedural hemorrhage and hematoma of a digestive system organ or structure following a digestive system procedure: Principal | ICD-10-CM | POA: Diagnosis present

## 2018-03-31 DIAGNOSIS — D649 Anemia, unspecified: Secondary | ICD-10-CM | POA: Diagnosis present

## 2018-03-31 DIAGNOSIS — Z79899 Other long term (current) drug therapy: Secondary | ICD-10-CM

## 2018-03-31 DIAGNOSIS — Z992 Dependence on renal dialysis: Secondary | ICD-10-CM | POA: Diagnosis not present

## 2018-03-31 DIAGNOSIS — J45909 Unspecified asthma, uncomplicated: Secondary | ICD-10-CM | POA: Diagnosis present

## 2018-03-31 DIAGNOSIS — B181 Chronic viral hepatitis B without delta-agent: Secondary | ICD-10-CM | POA: Diagnosis not present

## 2018-03-31 DIAGNOSIS — Z87891 Personal history of nicotine dependence: Secondary | ICD-10-CM

## 2018-03-31 DIAGNOSIS — N186 End stage renal disease: Secondary | ICD-10-CM | POA: Diagnosis not present

## 2018-03-31 DIAGNOSIS — I120 Hypertensive chronic kidney disease with stage 5 chronic kidney disease or end stage renal disease: Secondary | ICD-10-CM | POA: Diagnosis not present

## 2018-03-31 DIAGNOSIS — D638 Anemia in other chronic diseases classified elsewhere: Secondary | ICD-10-CM | POA: Diagnosis present

## 2018-03-31 DIAGNOSIS — B20 Human immunodeficiency virus [HIV] disease: Secondary | ICD-10-CM | POA: Diagnosis not present

## 2018-03-31 DIAGNOSIS — K921 Melena: Secondary | ICD-10-CM | POA: Diagnosis not present

## 2018-03-31 DIAGNOSIS — Z9861 Coronary angioplasty status: Secondary | ICD-10-CM

## 2018-03-31 DIAGNOSIS — I252 Old myocardial infarction: Secondary | ICD-10-CM

## 2018-03-31 LAB — BASIC METABOLIC PANEL
ANION GAP: 15 (ref 5–15)
BUN: 80 mg/dL — ABNORMAL HIGH (ref 8–23)
CALCIUM: 8.9 mg/dL (ref 8.9–10.3)
CO2: 24 mmol/L (ref 22–32)
CREATININE: 8.69 mg/dL — AB (ref 0.61–1.24)
Chloride: 95 mmol/L — ABNORMAL LOW (ref 98–111)
GFR, EST AFRICAN AMERICAN: 7 mL/min — AB (ref >60)
GFR, EST NON AFRICAN AMERICAN: 6 mL/min — AB (ref >60)
GLUCOSE: 107 mg/dL — AB (ref 70–99)
Potassium: 3.7 mmol/L (ref 3.5–5.1)
Sodium: 134 mmol/L — ABNORMAL LOW (ref 135–145)

## 2018-03-31 LAB — CBC
HEMATOCRIT: 17.8 % — AB (ref 39.0–52.0)
HEMOGLOBIN: 5.4 g/dL — AB (ref 13.0–17.0)
MCH: 30.7 pg (ref 26.0–34.0)
MCHC: 30.3 g/dL (ref 30.0–36.0)
MCV: 101.1 fL — ABNORMAL HIGH (ref 78.0–100.0)
Platelets: 286 10*3/uL (ref 150–400)
RBC: 1.76 MIL/uL — ABNORMAL LOW (ref 4.22–5.81)
RDW: 18.5 % — ABNORMAL HIGH (ref 11.5–15.5)
WBC: 8.2 10*3/uL (ref 4.0–10.5)

## 2018-03-31 LAB — CBG MONITORING, ED: Glucose-Capillary: 100 mg/dL — ABNORMAL HIGH (ref 70–99)

## 2018-03-31 LAB — PREPARE RBC (CROSSMATCH)

## 2018-03-31 LAB — I-STAT TROPONIN, ED: Troponin i, poc: 0.04 ng/mL (ref 0.00–0.08)

## 2018-03-31 LAB — HEMOGLOBIN AND HEMATOCRIT, BLOOD
HCT: 17.4 % — ABNORMAL LOW (ref 39.0–52.0)
Hemoglobin: 5.7 g/dL — CL (ref 13.0–17.0)

## 2018-03-31 MED ORDER — ACETAMINOPHEN 325 MG PO TABS
650.0000 mg | ORAL_TABLET | Freq: Four times a day (QID) | ORAL | Status: DC | PRN
  Administered 2018-04-02: 650 mg via ORAL
  Filled 2018-03-31: qty 2

## 2018-03-31 MED ORDER — CALCIUM ACETATE (PHOS BINDER) 667 MG PO CAPS
667.0000 mg | ORAL_CAPSULE | ORAL | Status: DC
  Filled 2018-03-31: qty 2

## 2018-03-31 MED ORDER — CARVEDILOL 25 MG PO TABS
25.0000 mg | ORAL_TABLET | Freq: Two times a day (BID) | ORAL | Status: DC
  Administered 2018-03-31 – 2018-04-03 (×5): 25 mg via ORAL
  Filled 2018-03-31 (×6): qty 1

## 2018-03-31 MED ORDER — ACETAMINOPHEN 650 MG RE SUPP: 650.0000 mg | Freq: Four times a day (QID) | RECTAL | Status: DC | PRN

## 2018-03-31 MED ORDER — BICTEGRAVIR-EMTRICITAB-TENOFOV 50-200-25 MG PO TABS
1.0000 | ORAL_TABLET | Freq: Every day | ORAL | Status: DC
  Administered 2018-03-31 – 2018-04-03 (×4): 1 via ORAL
  Filled 2018-03-31 (×5): qty 1

## 2018-03-31 MED ORDER — CALCIUM ACETATE (PHOS BINDER) 667 MG PO CAPS: 667.0000 mg | ORAL_CAPSULE | ORAL | Status: DC

## 2018-03-31 MED ORDER — TRAZODONE HCL 50 MG PO TABS
50.0000 mg | ORAL_TABLET | Freq: Every day | ORAL | Status: DC
  Administered 2018-03-31 – 2018-04-03 (×4): 50 mg via ORAL
  Filled 2018-03-31 (×4): qty 1

## 2018-03-31 MED ORDER — SODIUM CHLORIDE 0.9% IV SOLUTION: Freq: Once | INTRAVENOUS | Status: DC

## 2018-03-31 MED ORDER — SODIUM CHLORIDE 0.9% IV SOLUTION
Freq: Once | INTRAVENOUS | Status: AC
  Administered 2018-04-01: via INTRAVENOUS

## 2018-03-31 MED ORDER — RENA-VITE PO TABS
1.0000 | ORAL_TABLET | Freq: Every day | ORAL | Status: DC
  Administered 2018-03-31 – 2018-04-03 (×4): 1 via ORAL
  Filled 2018-03-31 (×4): qty 1

## 2018-03-31 MED ORDER — HYDRALAZINE HCL 50 MG PO TABS
75.0000 mg | ORAL_TABLET | Freq: Three times a day (TID) | ORAL | Status: DC
  Administered 2018-03-31 – 2018-04-03 (×8): 75 mg via ORAL
  Filled 2018-03-31 (×12): qty 1

## 2018-03-31 MED ORDER — HEPARIN SODIUM (PORCINE) 5000 UNIT/ML IJ SOLN
5000.0000 [IU] | Freq: Three times a day (TID) | INTRAMUSCULAR | Status: DC
  Administered 2018-03-31 – 2018-04-01 (×3): 5000 [IU] via SUBCUTANEOUS
  Filled 2018-03-31 (×3): qty 1

## 2018-03-31 MED ORDER — MEGESTROL ACETATE 40 MG PO TABS
80.0000 mg | ORAL_TABLET | Freq: Two times a day (BID) | ORAL | Status: DC
  Administered 2018-03-31 – 2018-04-03 (×5): 80 mg via ORAL
  Filled 2018-03-31 (×11): qty 2

## 2018-03-31 MED ORDER — CALCIUM ACETATE (PHOS BINDER) 667 MG PO CAPS
1334.0000 mg | ORAL_CAPSULE | Freq: Three times a day (TID) | ORAL | Status: DC
  Administered 2018-03-31 – 2018-04-02 (×5): 1334 mg via ORAL
  Filled 2018-03-31 (×7): qty 2

## 2018-03-31 NOTE — ED Provider Notes (Signed)
[facility] EMERGENCY DEPARTMENT Provider Note  CSN: [id number] Arrival date & time: 03/31/18  6270  History   Chief Complaint Chief Complaint  Patient presents with  . Shortness of Breath   HPI Jeffrey [name] is a 62 y.o. male with a complex medical history of ESRD (home hemodialysis 4x per week), HIV, NSTEMI, asthma, hyperthyroidism, duodenal masses and seizures who presented to the ED for SOB x2 days. Associated symptom of fatigue which he states that he has felt since his discharge from the hospital on 03/21/18. Denies chest pain, diaphoresis, fever, abdominal pain, N/V, hematemesis, melena/hematochezia, changes in bowel or urinary habits. Denies recent sick contacts, new exposures or travel. Patient states he had his last home hemodialysis treatment last night.  Additional history obtained by medical chart. Patient was hospitalized at [facility] from 03/14/18-03/21/18 for NSTEMI. His last recorded Hgb was 7.3 on  03/07/18.  Past Medical History:  Diagnosis Date  . Anemia   . Anxiety   . Arthritis   . Asthma    per pt hx  . End stage renal disease on home HD 07/10/2011   Started HD in September 2012 at [facility] with a tunneled HD catheter, now on home HD with N[facility]Stage. Dialyzing through AVF L lower arm with buttonhole technique as of mid 2014. His brother does the HD treatments at home.  They are roommates for 23 years.  The brother works 3rd shift and gets off about 8am and then puts Jeffrey [name] on HD in the morning after getting home. Most of the time he does HD about 4 times a week, for about 4 hours per treatment. Cause of ESRD was HTN according to patient. He says he let his health go and ending up with complications, and that he didn't like seeing doctors in those days.  He says he was diagnosed with severe HTN when he lived in [place] in his 41's.   . Hepatitis B carrier ([place])   . HIV infection ([place])   . Hypertension   . Hyperthyroidism    normal now  . Pneumonia several yrs  ago  . Seizure ([place])   . Seizures ([place]) 06/02/2011   x 1 none since  . Thrombocytopenia [facility])     Patient Active Problem List   Diagnosis Date Noted  . Acute on chronic anemia 03/31/2018  . Malnutrition of moderate degree 02/23/2018  . Seizure ([place]) 02/22/2018  . Encounter for central line placement   . Lactic acidosis   . Acute uremia   . Mass of duodenum   . Anemia of chronic kidney failure, unspecified stage   . Infectious joint disease 10/04/2017  . Symptomatic anemia 09/23/2017  . Antibiotic long-term use   . Septic arthritis ([place]) 09/15/2017  . Encounter for incision and drainage procedure 09/15/2017  . Effusion of left knee 09/08/2017  . Intertrigo 07/09/2017  . AC joint arthropathy 06/16/2017  . Pseudoaneurysm of arteriovenous dialysis fistula ([place]) 12/23/2016  . Eczema 12/10/2016  . Bilateral low back pain without sciatica   . Aortic valve insufficiency   . Fall 09/08/2016  . Knee abrasion, right, initial encounter 09/08/2016  . Lumbosacral spondylosis without myelopathy 08/03/2016  . Pulmonary nodules 08/03/2016  . Essential hypertension   . Anemia of chronic disease 04/08/2016  . Secondary hyperparathyroidism ([place]) 02/09/2014  . Asthma 02/09/2014  . Nuclear sclerotic cataract of both eyes 12/12/2013  . Myopia with presbyopia of both eyes 10/26/2013  . COPD (chronic obstructive pulmonary disease) ([place]) 06/27/2013  .  Thrombocytopenia ([place]) 01/09/2013  . HBV (hepatitis B virus) infection 01/09/2013  . Anxiety 07/22/2011  . End stage renal disease on home HD 07/10/2011  . HIV disease ([place]) 06/17/2011    Past Surgical History:  Procedure Laterality Date  . AV FISTULA PLACEMENT  06/02/11   Left radiocephalic AVF  . BIOPSY  02/17/2018   Procedure: BIOPSY;  Surgeon: Milus Banister, MD;  Location: [facility] ENDOSCOPY;  Service: Endoscopy;;  . COLONOSCOPY    . COLONOSCOPY WITH PROPOFOL N/A 01/27/2018   Procedure: COLONOSCOPY WITH PROPOFOL;  Surgeon: [name], MD;   Location: [facility] ENDOSCOPY;  Service: Gastroenterology;  Laterality: N/A;  . CORONARY ANGIOPLASTY    . ESOPHAGOGASTRODUODENOSCOPY N/A 02/17/2018   Procedure: ESOPHAGOGASTRODUODENOSCOPY (EGD);  Surgeon: Milus Banister, MD;  Location: [facility] ENDOSCOPY;  Service: Endoscopy;  Laterality: N/A;  . ESOPHAGOGASTRODUODENOSCOPY (EGD) WITH PROPOFOL N/A 01/27/2018   Procedure: ESOPHAGOGASTRODUODENOSCOPY (EGD) WITH PROPOFOL;  Surgeon: [name], MD;  Location: [facility] ENDOSCOPY;  Service: Gastroenterology;  Laterality: N/A;  . EUS N/A 02/17/2018   Procedure: UPPER ENDOSCOPIC ULTRASOUND (EUS) RADIAL;  Surgeon: Milus Banister, MD;  Location: [facility] ENDOSCOPY;  Service: Endoscopy;  Laterality: N/A;  . fistulaogram     x 2 last 2 years  . IR FLUORO GUIDE CV LINE RIGHT  09/16/2017  . IR US GUIDE VASC ACCESS RIGHT  09/16/2017  . IRRIGATION AND DEBRIDEMENT KNEE Left 09/15/2017   Procedure: IRRIGATION AND DEBRIDEMENT KNEE; arthroscopic clean out;  Surgeon: [name], MD;  Location: [facility] ORS;  Service: Orthopedics;  Laterality: Left;  . OTHER SURGICAL HISTORY     removal temporary HD catheter   . REVISON OF ARTERIOVENOUS FISTULA Left 10/10/2015   Procedure: REVISON OF LEFT RADIOCEPHALIC ARTERIOVENOUS FISTULA;  Surgeon: Angelia Mould, MD;  Location: [facility];  Service: Vascular;  Laterality: Left;  . REVISON OF ARTERIOVENOUS FISTULA Left 02/07/2016   Procedure: REPAIR OF PSEUDO-ANEUREYSM OF LEFT ARM  ARTERIOVENOUS FISTULA;  Surgeon: Angelia Mould, MD;  Location: [facility];  Service: Vascular;  Laterality: Left;  . REVISON OF ARTERIOVENOUS FISTULA Left 3/55/7322   Procedure: PLICATION OF LEFT ARM RADIOCEPHALIC ARTERIOVENOUS FISTULA PSEUDOANEURYSM;  Surgeon: Angelia Mould, MD;  Location: [facility];  Service: Vascular;  Laterality: Left;  . RIGHT/LEFT HEART CATH AND CORONARY ANGIOGRAPHY N/A 02/17/2017   Procedure: Right/Left Heart Cath and Coronary Angiography;  Surgeon: Sherren Mocha, MD;  Location: [facility]  CV LAB;  Service: Cardiovascular;  Laterality: N/A;  . TEE WITHOUT CARDIOVERSION N/A 09/11/2016   Procedure: TRANSESOPHAGEAL ECHOCARDIOGRAM (TEE);  Surgeon: Dorothy Spark, MD;  Location: [facility] ENDOSCOPY;  Service: Cardiovascular;  Laterality: N/A;        Home Medications    Prior to Admission medications   Medication Sig Start Date End Date Taking? Authorizing Provider  albuterol (PROVENTIL) (2.5 MG/3ML) 0.083% nebulizer solution Take 2.5 mg by nebulization every 6 (six) hours as needed for wheezing or shortness of breath.    [provider]  ALPRAZolam Duanne Moron) 0.25 MG tablet Take 0.25 mg by mouth 3 (three) times daily as needed.  11/29/17   [provider]  aspirin 81 MG chewable tablet Chew 1 tablet (81 mg total) by mouth daily. 10/15/17   Vaughan Basta, MD  bictegravir-emtricitabine-tenofovir AF (BIKTARVY) 50-200-25 MG TABS tablet Take 1 tablet by mouth daily. 12/27/17   Campbell Riches, MD  calcium acetate (PHOSLO) 667 MG capsule Take 667-2,001 mg by mouth See admin instructions. 1,334-2,001 mg three times a day with each meal and 667-1,334 mg  with each snack 06/06/15   [provider]  carvedilol (COREG) 25 MG tablet Take 1 tablet (25 mg total) by mouth 2 (two) times daily with a meal. 10/05/17   Alphonzo Grieve, MD  cloNIDine (CATAPRES - DOSED IN MG/24 HR) 0.1 mg/24hr patch UNWRAP AND APPLY 1 PATCH TO SKIN EVERY 7 DAYS 01/28/18   Velna Ochs, MD  clotrimazole-betamethasone (LOTRISONE) cream Apply 1 application topically 2 (two) times daily. Patient taking differently: Apply 1 application topically 2 (two) times daily as needed (irritation).  09/08/17   Campbell Riches, MD  doxazosin (CARDURA) 4 MG tablet Take 4 mg by mouth at bedtime.  07/04/17   [provider]  epoetin alfa (EPOGEN,PROCRIT) 2000 UNIT/ML injection Inject 2,000 Units into the vein 4 (four) times a week.     [provider]  ethyl chloride spray Apply 1  application topically daily as needed (HD).  05/20/15   [provider]  heparin 1000 UNIT/ML injection Inject 3,000 Units into the vein 4 (four) times a week.     [provider]  hydrALAZINE (APRESOLINE) 25 MG tablet TAKE 3 TABLETS(75 MG) BY MOUTH EVERY 8 HOURS 03/25/18   Velna Ochs, MD  iron sucrose (VENOFER) 20 MG/ML injection Inject 100 mg into the vein once a week. On Monday    [provider]  megestrol (MEGACE) 40 MG tablet Take 2 tablets (80 mg total) by mouth 2 (two) times daily. 02/28/18   Kathi Ludwig, MD  morphine (MSIR) 15 MG tablet Take 1 tablet (15 mg total) by mouth every 6 (six) hours as needed for severe pain. 03/07/18   Carmin Muskrat, MD  multivitamin (RENA-VIT) TABS tablet Take 1 tablet by mouth daily.      [provider]  omeprazole (PRILOSEC) 20 MG capsule Take 1 capsule (20 mg total) by mouth daily. Patient not taking: Reported on 02/21/2018 01/31/18   [name], MD  traZODone (DESYREL) 50 MG tablet TAKE 1 TABLET(50 MG) BY MOUTH AT BEDTIME AS NEEDED FOR SLEEP 01/31/18   Campbell Riches, MD  VOLTAREN 1 % GEL APPLY 2 GRAMS EXTERNALLY TO THE AFFECTED AREA FOUR TIMES DAILY Patient taking differently: Apply 2 g topically 3 (three) times daily as needed (pain).  09/08/17   Campbell Riches, MD    Family History Family History  Problem Relation Age of Onset  . Hypertension Mother   . Diabetes Mother   . Cancer Father     Social History Social History   Tobacco Use  . Smoking status: Former Smoker    Years: 10.00    Types: Cigarettes    Last attempt to quit: 08/08/2015    Years since quitting: 2.6  . Smokeless tobacco: Never Used  . Tobacco comment: casual smoking for 10 years  Substance Use Topics  . Alcohol use: No    Alcohol/week: 0.0 oz    Comment: quit 2004  . Drug use: No    Comment: uds (+) cocaine in 08/2011 but pt states was taking sudafed at the time     Allergies   Lisinopril and  Penicillins   Review of Systems Review of Systems  Constitutional: Positive for fatigue. Negative for activity change, appetite change, chills, diaphoresis and fever.  Respiratory: Positive for shortness of breath. Negative for cough, choking, chest tightness and wheezing.   Cardiovascular: Negative for chest pain, palpitations and leg swelling.  Gastrointestinal: Negative for abdominal distention, abdominal pain, constipation, diarrhea, nausea and vomiting.  Genitourinary: Negative for difficulty urinating,  dysuria, frequency, hematuria and urgency.  Skin: Negative.   Allergic/Immunologic: Positive for immunocompromised state.  Neurological: Positive for weakness. Negative for dizziness, speech difficulty, light-headedness, numbness and headaches.  Hematological: Negative.   Psychiatric/Behavioral: Negative for confusion and decreased concentration.     Physical Exam Updated Vital Signs BP (!) 158/65   Pulse 92   Temp 98.4 F (36.9 C) (Oral)   Resp 20   SpO2 100%   Physical Exam  Constitutional: He appears ill. No distress.  HENT:  Mouth/Throat: Uvula is midline, oropharynx is clear and moist and mucous membranes are normal.  Eyes: Pupils are equal, round, and reactive to light. Conjunctivae, EOM and lids are normal.  Neck: Normal carotid pulses present.  Pulmonary/Chest: Effort normal and breath sounds normal. He has no decreased breath sounds.  Increased work of breathing when sitting upright.  Abdominal: Soft. Bowel sounds are normal. He exhibits no distension. There is no tenderness.  Musculoskeletal:       Right lower leg: He exhibits no edema.       Left lower leg: He exhibits no edema.  Neurological: He is alert. He is not disoriented.  Skin: Skin is warm. He is not diaphoretic. No cyanosis.  Nursing note and vitals reviewed.    ED Treatments / Results  Labs (all labs ordered are listed, but only abnormal results are displayed) Labs Reviewed  BASIC METABOLIC  PANEL - Abnormal; Notable for the following components:      Result Value   Sodium 134 (*)    Chloride 95 (*)    Glucose, Bld 107 (*)    BUN 80 (*)    Creatinine, Ser 8.69 (*)    GFR calc non Af Amer 6 (*)    GFR calc Af Amer 7 (*)    All other components within normal limits  CBC - Abnormal; Notable for the following components:   RBC 1.76 (*)    Hemoglobin 5.4 (*)    HCT 17.8 (*)    MCV 101.1 (*)    RDW 18.5 (*)    All other components within normal limits  CBG MONITORING, ED - Abnormal; Notable for the following components:   Glucose-Capillary 100 (*)    All other components within normal limits  URINALYSIS, ROUTINE W REFLEX MICROSCOPIC  I-STAT TROPONIN, ED  TYPE AND SCREEN  PREPARE RBC (CROSSMATCH)    EKG EKG Interpretation  Date/Time:  Thursday March 31 2018 06:12:37 EDT Ventricular Rate:  89 PR Interval:  196 QRS Duration: 96 QT Interval:  394 QTC Calculation: 479 R Axis:   57 Text Interpretation:  Normal sinus rhythm Nonspecific ST and T wave abnormality Abnormal ECG No significant change since last tracing Confirmed by Orpah Greek [phone]) on 03/31/2018 7:36:55 AM   Radiology No results found.  Procedures Procedures (including critical care time)  Medications Ordered in ED Medications  0.9 %  sodium chloride infusion (Manually program via Guardrails IV Fluids) (has no administration in time range)   Initial Impression / Assessment and Plan / ED Course  Triage vital signs and the nursing notes have been reviewed.  Pertinent labs & imaging results that were available during care of the patient were reviewed and considered in medical decision making (see chart for details).  Patient presents afebrile, with normal vital signs and has a fatigued appearance. He endorses worsening SOB when sitting upright and relief when lying down. No s/s of pancreatitis or pericarditis on exam.  Clinical Course as of Mar 31 837  Thu Mar 31, 2018  0726 Hgb 5.4.  Patient is symptomatic with SOB on exam. Type and screen ordered.   [GM]  [phone] Case discussed with internal medicine resident who will admit patient. Order placed to order 2 units of blood for transfusion.   [GM]    Clinical Course User Index [GM] Junita Push   Per medical chart, patient had endoscopy in 02/2018 and duodenal masses were identified. Denies hematochezia/melena, hematemesis, hematuria or identifiable source for blood loss. Physical exam is not consistent with an acute blood loss. Anemia likely due to ESRD. Inpatient hospitalization is required for blood transfusion and further management of anemia.  Final Clinical Impressions(s) / ED Diagnoses  1. Symptomatic Anemia. Due to ESRD. Hgb 5.4. Transfuse 2 units of RBCs.  Dispo: Admit. Case discussed with Internal Medicine resident who will admit patient.  Final diagnoses:  Symptomatic anemia  ESRD (end stage renal disease) on dialysis [facility])    ED Discharge Orders    None        Romie Jumper, [place] 03/31/18 7125    Orpah Greek, MD 04/01/18 [phone]

## 2018-03-31 NOTE — Telephone Encounter (Signed)
Pt has an appt in [facility] on 7/16. Next appt scheduled  With PCP is 8/12.

## 2018-03-31 NOTE — ED Provider Notes (Signed)
Patient presented to the ER with weakness and shortness of breath.  Patient reports that he was recently in the hospital and had disruption of his Epogen administration.  Face to face Exam: HEENT - PERRLA Lungs - CTAB Heart - RRR, no M/R/G Abd - S/NT/ND Neuro - alert, oriented x3  Plan: Patient with significant hemoglobin anemia, hemoglobin is 5.4.  Will require hospitalization for blood transfusion for symptomatic anemia.   Orpah Greek, MD 03/31/18 [phone]

## 2018-03-31 NOTE — Progress Notes (Signed)
Notified MD oncall that pt's Hbg is 5.7. Will continue to monitor pt.  Ranelle Oyster, RN

## 2018-03-31 NOTE — ED Notes (Signed)
PT requests that hydralazine, biktarvy, amlodipine, and carvedilol be ordered for morning meds. Will speak to EDP about this.

## 2018-03-31 NOTE — H&P (Signed)
Date: 03/31/2018               Patient Name:  Jeffrey [name] MRN: [id number]  DOB: Aug 09, 1956 Age / Sex: 62 y.o., male   PCP: Velna Ochs, MD         Medical Service: Internal Medicine Teaching Service         Attending Physician: Dr. Bartholomew Crews, MD    First Contact: Dr. Koleen Distance  Pager: [phone]  Second Contact: Dr. 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 He was recently admit to an outside hospital from 6/24 - 7/09-2017 for ERCP attempt at removal of a duodenal mass. He was initially admit when he arrived at the outpatient GI center for the procedure and was noted to have a blood pressure of 200/95 which was symptomatic with confusion and altered mental status. He was transferred to the ED where MRI head was negative for CVA or PRESS. He required a clevidipine drip and returned to baseline within hours. The encephalopathy was thought to be secondary to toxic metabolic and hypertensive and he was admit for the ERCP during which he had a stent placed and incomplete removal of the mass. The final pathology from the procedure is not available through care everywhere at this time. During the hospitalization he had hemodialysis treatments but did not receive his ESA during the treatments. When he returned home he resumed his ESA but noted the development of shortness of breath and progressive weakness. He denies any signs of blood loss such as melena,  BRBPR, hematochezia, hematemesis, skin or mucosal bleeding.   Meds:  Medications Prior to Admission  Medication Sig Dispense Refill  . albuterol (PROVENTIL) (2.5 MG/3ML) 0.083% nebulizer solution Take 2.5 mg by nebulization every 6 (six) hours as needed for wheezing or shortness of breath.    . ALPRAZolam (XANAX) 0.25 MG tablet Take 0.25 mg by mouth 3 (three) times daily as needed.     Marland Kitchen aspirin 81 MG chewable tablet Chew 1 tablet (81 mg total) by mouth daily. 30 tablet 0  . bictegravir-emtricitabine-tenofovir AF (BIKTARVY) 50-200-25 MG TABS tablet Take 1 tablet by mouth daily. 30 tablet 11  . calcium acetate (PHOSLO) 667 MG capsule Take 667-2,001 mg by mouth See admin instructions. 1,334-2,001 mg three times a day with each meal and 667-1,334 mg with each snack    . carvedilol (COREG) 25 MG tablet Take 1 tablet (25 mg total) by mouth 2 (two) times daily with a meal. 60 tablet 5  . cloNIDine (CATAPRES - DOSED IN MG/24 HR) 0.1 mg/24hr patch UNWRAP AND APPLY 1 PATCH TO SKIN EVERY 7 DAYS 4 patch 0  . clotrimazole-betamethasone (LOTRISONE) cream Apply 1 application topically 2 (two) times daily. (Patient taking differently: Apply 1 application topically 2 (two) times daily as needed (irritation). ) 30 g 0  . doxazosin (CARDURA) 4 MG tablet Take 4 mg by mouth at bedtime.   8  .  epoetin alfa (EPOGEN,PROCRIT) 2000 UNIT/ML injection Inject 2,000 Units into the vein 4 (four) times a week.     . ethyl chloride spray Apply 1 application topically daily as needed (HD).   12  . heparin 1000 UNIT/ML injection Inject 3,000 Units into the vein 4 (four) times a week.     . hydrALAZINE (APRESOLINE) 25 MG tablet TAKE 3 TABLETS(75 MG) BY MOUTH EVERY 8 HOURS 42 tablet 0  . iron sucrose (VENOFER) 20 MG/ML injection Inject 100 mg into the vein once a week. On Monday    . megestrol (MEGACE) 40 MG tablet Take 2 tablets (80 mg total) by mouth 2 (two) times daily. 120 tablet 0  . morphine (MSIR) 15 MG tablet Take 1 tablet  (15 mg total) by mouth every 6 (six) hours as needed for severe pain. 10 tablet 0  . multivitamin (RENA-VIT) TABS tablet Take 1 tablet by mouth daily.      Marland Kitchen omeprazole (PRILOSEC) 20 MG capsule Take 1 capsule (20 mg total) by mouth daily. (Patient not taking: Reported on 02/21/2018) 90 capsule 3  . traZODone (DESYREL) 50 MG tablet TAKE 1 TABLET(50 MG) BY MOUTH AT BEDTIME AS NEEDED FOR SLEEP 30 tablet 3  . VOLTAREN 1 % GEL APPLY 2 GRAMS EXTERNALLY TO THE AFFECTED AREA FOUR TIMES DAILY (Patient taking differently: Apply 2 g topically 3 (three) times daily as needed (pain). ) 100 g 1     Allergies: Allergies as of 03/31/2018 - Review Complete 03/31/2018  Allergen Reaction Noted  . Lisinopril Anaphylaxis and Shortness Of Breath 08/31/2012  . Penicillins Other (See Comments)    Past Medical History:  Diagnosis Date  . Anemia   . Anxiety   . Arthritis   . Asthma    per pt hx  . End stage renal disease on home HD 07/10/2011   Started HD in September 2012 at [facility] with a tunneled HD catheter, now on home HD with NxtStage. Dialyzing through AVF L lower arm with buttonhole technique as of mid 2014. His brother does the HD treatments at home.  They are roommates for 23 years.  The brother works 3rd shift and gets off about 8am and then puts Mr Bun on HD in the morning after getting home. Most of the time he does HD about 4 times a week, for about 4 hours per treatment. Cause of ESRD was HTN according to patient. He says he let his health go and ending up with complications, and that he didn't like seeing doctors in those days.  He says he was diagnosed with severe HTN when he lived in [place] in his 79's.   . Hepatitis B carrier ([place])   . HIV infection ([place])   . Hypertension   . Hyperthyroidism    normal now  . Pneumonia several yrs ago  . Seizure ([place])   . Seizures ([place]) 06/02/2011   x 1 none since  . Thrombocytopenia ([place])     Family History: Mother - hypertension, she has passed from  advanced age  Father - he has passed from advanced age   Social History:  Lives in [place] with his brother who helps him with HD. 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 He is retired. He does not smoke cigarettes, drink alcohol, or use illicit drugs. Remote history of cigarette and alcohol use in the very distant past, he cant recall the last time that he did  either. At most he smoked 1 pack per month for about 10 years.   Review of Systems: A complete ROS was negative except as per HPI.   Physical Exam: Blood pressure (!) 157/62, pulse 87, temperature 99.4 F (37.4 C), resp. rate 20, height 6\' 1"  (1.854 m), weight 176 lb 5.9 oz (80 kg), SpO2 100 %. General: well appearing, no acute distress  HEENT: pale conjunctiva, moist mucous membranes, no scleral icterus  Cardiac: There is a 2/6 systolic murmur loudest over RUSB, normal S1 and S2, AVF of the distal RLE is with palpable thrill and audible bruit,no peripheral edema or calf tenderness Pulm: normal work of breathing, lungs are clear to auscultation, no wheezes or rhonchi  GI: the abdomen is soft, non tender, non distended, no palpable masses  Skin: no rashes/ petechia over the exposed arms, chest, back, lower legs  Neuro: awake, alert oriented  EKG: personally reviewed my interpretation is sinus rhythm, rate 90, normal axis, 1 mm ST segment elevations in V2-V4 without reciprocal changes   Assessment & Plan by Problem: Active Problems:   Acute on chronic anemia  Acute on chronic anemia  Mr. Gallicchio is a 62 year old man with [place] ESRD ( on home HD) and anemia of chronic disease who presents with symptoms of shortness of breath and generalized weakness and was found to have a hemoglobin of 5.4. He has an MCV 101 and elevated RDW. For anemia of chronic disease he is on treatment with epogen which he receives 4 times weekly during HD. He was recently hospitalized for ERCP and attempted  removal duodenal mass at an outside hospital and during the hospitalization he did not receive the epogen. His hemoglobin was 7.3 on 6/17 at an ED visit prior to the admission for the procedure. During the admission on 6/24 his hemoglobin was found to be 8.0, this was on the morning prior to the procedure. He denies signs of GI or mucosal bleeding. The procedure was noted to be uncomplicated. Low hemoglobin is likely multifactorial given known duodenal mass, anemia of chronic disease which had gone untreated for one week, and with recent operation. EKG did show some ST segment elevations in lateral leads V2-V4 which are consistent with prior EKG however he is chest pain free. Will continue to monitor for signs of a source and plan to continue the erythropoiesis stimulating agent. - Transfuse 1 unit PRBC  - Follow up post transfusion H&H and transfuse if hemoglobin remains less than 7  - order stat troponin and transfuse to a goal of 8 if he develops symptoms of myocardial injury   ESRD  Labs show an elevated BUN, normal potassium on exam he is hypertensive but has not yet taking his morning antihypertensives and does not have signs of volume overload.  - continue to monitor  - will consults nephrology if he remains hospitalized tomorrow   Dispo: Admit patient to Observation with expected length of stay less than 2 midnights.  Signed: Ledell Noss, MD 03/31/2018, 1:36 PM  Pager: [phone]

## 2018-03-31 NOTE — Care Management CC44 (Signed)
Condition Code 44 Documentation Completed  Patient Details  Name: Jeffrey [name] MRN: [id number] Date of Birth: Aug 31, 1956   Condition Code 44 given:   YES Patient signature on Condition Code 44 notice:   YES Documentation of 2 MD's agreement:   YES Code 44 added to claim:   Barbara Cower, RN 03/31/2018, 12:18 PM

## 2018-03-31 NOTE — ED Notes (Signed)
Margarita Grizzle, RN accepts report

## 2018-03-31 NOTE — ED Triage Notes (Signed)
Pt reports he has had SOB (on and off) since his surgery.  Pt and his brother believe his hemoglobin is low due to weakness.

## 2018-03-31 NOTE — ED Notes (Signed)
EDP at bedside  

## 2018-03-31 NOTE — Care Management Obs Status (Signed)
[facility] NOTIFICATION   Patient Details  Name: SAXTON CHAIN MRN: [id number] Date of Birth: May 10, 1956   Medicare Observation Status Notification Given:  Yes    Sharin Mons, RN 03/31/2018, 12:18 PM

## 2018-04-01 ENCOUNTER — Inpatient Hospital Stay (HOSPITAL_COMMUNITY): Payer: Medicare Other

## 2018-04-01 DIAGNOSIS — D649 Anemia, unspecified: Secondary | ICD-10-CM

## 2018-04-01 DIAGNOSIS — J45909 Unspecified asthma, uncomplicated: Secondary | ICD-10-CM | POA: Diagnosis present

## 2018-04-01 DIAGNOSIS — K317 Polyp of stomach and duodenum: Secondary | ICD-10-CM | POA: Diagnosis not present

## 2018-04-01 DIAGNOSIS — D500 Iron deficiency anemia secondary to blood loss (chronic): Secondary | ICD-10-CM | POA: Diagnosis not present

## 2018-04-01 DIAGNOSIS — K921 Melena: Secondary | ICD-10-CM | POA: Diagnosis not present

## 2018-04-01 DIAGNOSIS — Z992 Dependence on renal dialysis: Secondary | ICD-10-CM | POA: Diagnosis not present

## 2018-04-01 DIAGNOSIS — B181 Chronic viral hepatitis B without delta-agent: Secondary | ICD-10-CM | POA: Diagnosis present

## 2018-04-01 DIAGNOSIS — N186 End stage renal disease: Secondary | ICD-10-CM

## 2018-04-01 DIAGNOSIS — B20 Human immunodeficiency virus [HIV] disease: Secondary | ICD-10-CM | POA: Diagnosis not present

## 2018-04-01 DIAGNOSIS — I252 Old myocardial infarction: Secondary | ICD-10-CM | POA: Diagnosis not present

## 2018-04-01 DIAGNOSIS — Z9861 Coronary angioplasty status: Secondary | ICD-10-CM | POA: Diagnosis not present

## 2018-04-01 DIAGNOSIS — Y848 Other medical procedures as the cause of abnormal reaction of the patient, or of later complication, without mention of misadventure at the time of the procedure: Secondary | ICD-10-CM | POA: Diagnosis present

## 2018-04-01 DIAGNOSIS — D631 Anemia in chronic kidney disease: Secondary | ICD-10-CM | POA: Diagnosis not present

## 2018-04-01 DIAGNOSIS — Z79899 Other long term (current) drug therapy: Secondary | ICD-10-CM | POA: Diagnosis not present

## 2018-04-01 DIAGNOSIS — Z87891 Personal history of nicotine dependence: Secondary | ICD-10-CM | POA: Diagnosis not present

## 2018-04-01 DIAGNOSIS — K91840 Postprocedural hemorrhage and hematoma of a digestive system organ or structure following a digestive system procedure: Secondary | ICD-10-CM | POA: Diagnosis present

## 2018-04-01 DIAGNOSIS — R188 Other ascites: Secondary | ICD-10-CM | POA: Diagnosis not present

## 2018-04-01 DIAGNOSIS — I120 Hypertensive chronic kidney disease with stage 5 chronic kidney disease or end stage renal disease: Secondary | ICD-10-CM | POA: Diagnosis not present

## 2018-04-01 DIAGNOSIS — D638 Anemia in other chronic diseases classified elsewhere: Secondary | ICD-10-CM | POA: Diagnosis present

## 2018-04-01 DIAGNOSIS — Z833 Family history of diabetes mellitus: Secondary | ICD-10-CM | POA: Diagnosis not present

## 2018-04-01 LAB — CBC
HCT: 16.8 % — ABNORMAL LOW (ref 39.0–52.0)
Hemoglobin: 5.6 g/dL — CL (ref 13.0–17.0)
MCH: 30.4 pg (ref 26.0–34.0)
MCHC: 33.3 g/dL (ref 30.0–36.0)
MCV: 91.3 fL (ref 78.0–100.0)
Platelets: 189 10*3/uL (ref 150–400)
RBC: 1.84 MIL/uL — ABNORMAL LOW (ref 4.22–5.81)
RDW: 17.9 % — ABNORMAL HIGH (ref 11.5–15.5)
WBC: 8.8 10*3/uL (ref 4.0–10.5)

## 2018-04-01 LAB — COMPREHENSIVE METABOLIC PANEL
ALBUMIN: 2.1 g/dL — AB (ref 3.5–5.0)
ALT: 7 U/L (ref 0–44)
AST: 11 U/L — AB (ref 15–41)
Alkaline Phosphatase: 44 U/L (ref 38–126)
Anion gap: 11 (ref 5–15)
BUN: 137 mg/dL — AB (ref 8–23)
CHLORIDE: 101 mmol/L (ref 98–111)
CO2: 25 mmol/L (ref 22–32)
Calcium: 8 mg/dL — ABNORMAL LOW (ref 8.9–10.3)
Creatinine, Ser: 10.08 mg/dL — ABNORMAL HIGH (ref 0.61–1.24)
GFR calc Af Amer: 6 mL/min — ABNORMAL LOW (ref >60)
GFR calc non Af Amer: 5 mL/min — ABNORMAL LOW (ref >60)
Glucose, Bld: 103 mg/dL — ABNORMAL HIGH (ref 70–99)
POTASSIUM: 4.3 mmol/L (ref 3.5–5.1)
SODIUM: 137 mmol/L (ref 135–145)
Total Bilirubin: 0.4 mg/dL (ref 0.3–1.2)
Total Protein: 4.3 g/dL — ABNORMAL LOW (ref 6.5–8.1)

## 2018-04-01 LAB — DIRECT ANTIGLOBULIN TEST (NOT AT ARMC)
DAT, COMPLEMENT: NEGATIVE
DAT, IgG: NEGATIVE

## 2018-04-01 LAB — RETICULOCYTES
RBC.: 1.83 MIL/uL — ABNORMAL LOW (ref 4.22–5.81)
Retic Count, Absolute: 82.4 10*3/uL (ref 19.0–186.0)
Retic Ct Pct: 4.5 % — ABNORMAL HIGH (ref 0.4–3.1)

## 2018-04-01 LAB — PREPARE RBC (CROSSMATCH)

## 2018-04-01 LAB — OCCULT BLOOD X 1 CARD TO LAB, STOOL: FECAL OCCULT BLD: POSITIVE — AB

## 2018-04-01 LAB — HEMOGLOBIN AND HEMATOCRIT, BLOOD
HCT: 16 % — ABNORMAL LOW (ref 39.0–52.0)
Hemoglobin: 5.4 g/dL — CL (ref 13.0–17.0)

## 2018-04-01 LAB — MRSA PCR SCREENING: MRSA by PCR: NEGATIVE

## 2018-04-01 LAB — SAVE SMEAR

## 2018-04-01 LAB — LACTATE DEHYDROGENASE: LDH: 102 U/L (ref 98–192)

## 2018-04-01 LAB — PHOSPHORUS: Phosphorus: <1 mg/dL — CL (ref 2.5–4.6)

## 2018-04-01 MED ORDER — HEPARIN SODIUM (PORCINE) 1000 UNIT/ML DIALYSIS
3000.0000 [IU] | Freq: Once | INTRAMUSCULAR | Status: DC
  Filled 2018-04-01: qty 3

## 2018-04-01 MED ORDER — SODIUM CHLORIDE 0.9 % IV SOLN: 100.0000 mL | INTRAVENOUS | Status: DC | PRN

## 2018-04-01 MED ORDER — ALTEPLASE 2 MG IJ SOLR
2.0000 mg | Freq: Once | INTRAMUSCULAR | Status: DC | PRN
  Filled 2018-04-01: qty 2

## 2018-04-01 MED ORDER — IOPAMIDOL (ISOVUE-300) INJECTION 61%
INTRAVENOUS | Status: AC
  Filled 2018-04-01: qty 30

## 2018-04-01 MED ORDER — CHLORHEXIDINE GLUCONATE CLOTH 2 % EX PADS: 6.0000 | MEDICATED_PAD | Freq: Every day | CUTANEOUS | Status: DC

## 2018-04-01 MED ORDER — PENTAFLUOROPROP-TETRAFLUOROETH EX AERO: 1.0000 "application " | INHALATION_SPRAY | CUTANEOUS | Status: DC | PRN

## 2018-04-01 MED ORDER — LIDOCAINE HCL (PF) 1 % IJ SOLN
5.0000 mL | INTRAMUSCULAR | Status: DC | PRN
  Filled 2018-04-01: qty 5

## 2018-04-01 MED ORDER — LIDOCAINE-PRILOCAINE 2.5-2.5 % EX CREA: 1.0000 "application " | TOPICAL_CREAM | CUTANEOUS | Status: DC | PRN

## 2018-04-01 MED ORDER — SODIUM CHLORIDE 0.9% IV SOLUTION
Freq: Once | INTRAVENOUS | Status: AC
  Administered 2018-04-02: 18:00:00 via INTRAVENOUS

## 2018-04-01 MED ORDER — IOHEXOL 300 MG/ML  SOLN
100.0000 mL | Freq: Once | INTRAMUSCULAR | Status: AC | PRN
  Administered 2018-04-01: 100 mL via INTRAVENOUS

## 2018-04-01 MED ORDER — HEPARIN SODIUM (PORCINE) 1000 UNIT/ML DIALYSIS
1000.0000 [IU] | INTRAMUSCULAR | Status: DC | PRN
  Filled 2018-04-01: qty 1

## 2018-04-01 MED ORDER — SODIUM CHLORIDE 0.9% IV SOLUTION
Freq: Once | INTRAVENOUS | Status: AC
  Administered 2018-04-01: 14:00:00 via INTRAVENOUS

## 2018-04-01 NOTE — Progress Notes (Signed)
CRITICAL VALUE ALERT  Critical Value:  Hemoglobin 5.4  Date & Time Notied:  04/01/2018 2150  Provider Notified: Dr. Dario Guardian  Orders Received/Actions taken: orders received to transfuse blood

## 2018-04-01 NOTE — Progress Notes (Signed)
Notified MD oncall that pt's Hbg is 5.6 after 1 unit of PRBCs. Will continue to monitor pt. Ranelle Oyster, RN

## 2018-04-01 NOTE — Progress Notes (Signed)
CRITICAL VALUE STICKER  CRITICAL VALUE: Phosphorus <1.0  RECEIVER (on-site recipient of call): Kennedy Bohanon P.  DATE & TIME NOTIFIED: 15:30  MD NOTIFIED: Dr. Koleen Distance  TIME OF NOTIFICATION: 15:45  RESPONSE: MD responded and will treat

## 2018-04-01 NOTE — Progress Notes (Signed)
Date: 04/01/2018  Patient name: Jeffrey [name]  Medical record number: [id number]  Date of birth: 11-17-1955   I have seen and evaluated Jeffrey [name] and discussed their care with the Residency Team. Jeffrey [name] is a 62 yo man with ESRD on home HD. He presents with acute on chronic symptomatic anemia.  He has a known duodenal mass which was biopsied via ERCP at [facility] on July 1.  It showed a large villous mass at the major papilla which was only partially resected.  However, the papilla tissue was only partially retrieved.  He had a single plastic pancreatic stent.placed to prevent postprocedure pancreatitis.  Pathology results are still pending.  He did not have a hemoglobin recorded at that time.  On the 11th, he knew something was not right including dyspnea and weakness and presented to the ED as he felt his hemoglobin was low.  Hemoglobin was low at 5.4.  He has since been transfused 3 units.  He denies melena or bright red blood per rectum or hematemesis.  He did note that his April had not been given during his hospitalization at [facility] from 6/24 - 7/1 when he was admitted for altered mental status thought to be secondary to PRES.  Vitals:   04/01/18 0259 04/01/18 0449  BP: (!) 158/92 (!) 154/63  Pulse: 85 82  Resp:  18  Temp: 99.6 F (37.6 C) 99.3 F (37.4 C)  SpO2: 100% 100%  Lying in bed, NAD HRRR could not appreciate murmur 2/2 noise ABD + BS, soft, NT Ext no edema  Alb 2.1 LDH 102 HgB baseline about 9 - 7.3 (6/17) - 5.4 - 5.7 - 5.6 MCV 91 Retic 4.5 CD4 390 (2/19) Heme +  I personally viewed the CXR images and confirmed my reading with the official read. AP portable, semi erect, poor quality - rotated, overpenetrated, No overt abnl  I personally viewed the EKG and confirmed my reading with the official read. Nl sinus, Nl axis, nl intervals, NSTW changes lateral leads  Assessment and Plan: I have seen and evaluated the patient as outlined above. I agree with the formulated  Assessment and Plan as detailed in the residents' note, with the following changes:  Jeffrey [name] is a 62 yo man with ESRD on home HD who presents with acute on chronic symptomatic anemia.  He has a known duodenal mass which was biopsied v via ERCP at [facility] on July 1.  His hemoglobin was 5.4 on admission and has since been transfused 3 units.  However his hemoglobin has not responded appropriately.  Due to the known duodenal mass, recent ERCP with biopsy, and heme +, an upper GI bleed is the most likely etiology.  We have started a hemolysis work-up along with a decreased production work-up both of which so far have not shown either of these are the etiology.  He is currently hemodynamically stable indicating he has not and is not having a brisk GI bleed.  If his hemoglobin subsequent to the third unit of packed red blood cells is not appropriate, we will need to contact GI for further management.  Whether this would be best accomplished by his gastroenterologist at [facility] or here at [facility] will need to be determined.  We have resumed his epo and have consulted nephrology so that he may get inpatient hemodialysis.  1.  Acute on chronic anemia -reassess hemoglobin after the third unit has been transfused.  If his hemoglobin is not appropriate, we will need to  consult GI, either his gastroenterologist at [facility] or here at [facility].  2.  End-stage renal disease -we will consult nephrology to resume his inpatient hemodialysis  3.  HIV -he is being maintained on his home antiviral medications.  Jeffrey Crews, MD 7/12/201911:50 AM

## 2018-04-01 NOTE — Consult Note (Addendum)
Reason for Consult: ESRD Referring Physician:  Dr. Larey Dresser  Chief Complaint: Weakness  Assessment/Plan: 1. ESRD - Last HD Tuesday at home and terribly [place] with treatments which was why he was released from [place]. - Will HD on lower flows to decrease risk of dialysis dysequilibrium. - about 4 kg up on EDW of 76kg. - Check phos 2. 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 HIV 4. Anemia - being transfused.  Seen on HD at 11PM 2k/2.5Ca Successful cannulation of button holes (thankfully the brother who does all the cannulations at home was available) VSS Gettting another unit of PRBC Sending a LDH and  Haptoglobin but most likely GIB post ERCP as pt c/o foul smelling tarry stool.   HPI: NAYIB REMER is an 62 y.o. male ESRD HTN HIV with known duodenal mass who recently had ERCP attempt at removal at [facility] 6/24-03/21/2018. He had partial removal of the mass but pathology is not available at this time. After d/c he developed progressive weakness but denied melena, hematochezia, hematemesis. He also denies fever/chills/nausea/vomiting but has mild dyspnea with a nonproductive cough. He was found to have a Hb of 5.6 and admitted with subsequent transfusion. His potassium was normal but the BUN and Cr were markedly elevated. He's usually on home HD 5x/week with the brother cannulating button holes in the left [place] but he was actually released from [place] of severe noncompliance. His EDW is 76kg and last HD was Tuesday.  ROS Pertinent items are noted in HPI.  Chemistry and CBC: Creatinine  Date/Time Value Ref Range Status  11/18/2016 10:36 AM 6.6 (HH) 0.7 - 1.3 mg/dL Final   Creat  Date/Time Value Ref Range Status  11/01/2017 10:08 AM 10.64 (H) 0.70 - 1.25 mg/dL Final    Comment:    Verified by repeat analysis. . For patients >85 years of age, the reference limit for Creatinine is approximately 13% higher for people identified as African-American. Marland Kitchen   02/10/2017 04:19 PM  13.81 (H) 0.70 - 1.25 mg/dL Final    Comment:    Result repeated and verified.   For patients > or = 64 years of age: The upper reference limit for Creatinine is approximately 13% higher for people identified as African-American.     06/10/2016 11:58 AM 14.64 (H) 0.70 - 1.25 mg/dL Final    Comment:    Result repeated and verified.   For patients > or = 64 years of age: The upper reference limit for Creatinine is approximately 13% higher for people identified as African-American.     04/07/2016 02:35 PM 14.21 (H) 0.70 - 1.25 mg/dL Final    Comment:    Result repeated and verified.   For patients > or = 64 years of age: The upper reference limit for Creatinine is approximately 13% higher for people identified as African-American.     10/14/2015 10:12 AM 16.52 (H) 0.70 - 1.25 mg/dL Final    Comment:    Result repeated and verified.  04/30/2015 11:10 AM 14.00 (H) 0.70 - 1.25 mg/dL Final    Comment:    Result repeated and verified.  10/23/2014 10:13 AM 13.66 (HH) 0.50 - 1.35 mg/dL Final    Comment:    Result repeated and verified. Result confirmed by automatic dilution.   10/24/2013 09:09 AM 9.80 (H) 0.50 - 1.35 mg/dL Final    Comment:    Result repeated and verified. Result confirmed by automatic dilution.  04/24/2013 10:28 AM 13.19 (HH) 0.50 - 1.35 mg/dL  Final    Comment:    Result repeated and verified.  10/12/2012 10:26 AM 11.51 (HH) 0.50 - 1.35 mg/dL Final    Comment:    Result repeated and verified.  05/31/2012 09:44 AM 13.81 (HH) 0.50 - 1.35 mg/dL Final    Comment:    Result repeated and verified.  11/25/2011 02:44 PM 7.71 (H) 0.50 - 1.35 mg/dL Final    Comment:    Result repeated and verified.   Creatinine, Ser  Date/Time Value Ref Range Status  04/01/2018 08:00 AM 10.08 (H) 0.61 - 1.24 mg/dL Final  03/31/2018 06:35 AM 8.69 (H) 0.61 - 1.24 mg/dL Final  03/07/2018 11:55 AM 10.18 (H) 0.61 - 1.24 mg/dL Final  02/28/2018 12:34 PM 11.74 (H) 0.61 - 1.24 mg/dL  Final  02/27/2018 08:09 AM 9.57 (H) 0.61 - 1.24 mg/dL Final  02/26/2018 09:30 AM 7.28 (H) 0.61 - 1.24 mg/dL Final    Comment:    DELTA CHECK NOTED  02/25/2018 08:19 AM 11.30 (H) 0.61 - 1.24 mg/dL Final  02/23/2018 08:00 AM 12.92 (H) 0.61 - 1.24 mg/dL Final    Comment:    DELTA CHECK NOTED  02/22/2018 01:34 AM 21.55 (H) 0.61 - 1.24 mg/dL Final  02/21/2018 07:47 PM >18.00 (H) 0.61 - 1.24 mg/dL Final  12/26/2017 03:20 PM 11.62 (H) 0.61 - 1.24 mg/dL Final  10/13/2017 04:17 AM 10.29 (H) 0.61 - 1.24 mg/dL Final  10/12/2017 02:30 PM 9.30 (H) 0.61 - 1.24 mg/dL Final  10/07/2017 09:11 PM 9.76 (H) 0.61 - 1.24 mg/dL Final  09/25/2017 05:08 AM 8.58 (H) 0.61 - 1.24 mg/dL Final  09/24/2017 05:46 AM 12.38 (H) 0.61 - 1.24 mg/dL Final  09/23/2017 06:00 AM 9.78 (H) 0.61 - 1.24 mg/dL Final  09/17/2017 05:44 AM 17.02 (H) 0.61 - 1.24 mg/dL Final  09/16/2017 05:14 AM 15.46 (H) 0.61 - 1.24 mg/dL Final  09/15/2017 01:30 PM 14.21 (H) 0.61 - 1.24 mg/dL Final  09/12/2016 06:14 AM 4.27 (H) 0.61 - 1.24 mg/dL Final  09/11/2016 06:18 AM 5.10 (H) 0.61 - 1.24 mg/dL Final  09/10/2016 03:51 PM 3.60 (H) 0.61 - 1.24 mg/dL Final    Comment:    DIALYSIS  09/10/2016 06:45 AM 6.09 (H) 0.61 - 1.24 mg/dL Final    Comment:    RESULT REPEATED AND VERIFIED DELTA CHECK NOTED   09/09/2016 01:55 AM 9.46 (H) 0.61 - 1.24 mg/dL Final  08/08/2016 07:50 AM 8.95 (H) 0.61 - 1.24 mg/dL Final  08/06/2016 07:26 AM 10.37 (H) 0.61 - 1.24 mg/dL Final    Comment:    DELTA CHECK NOTED  08/04/2016 02:25 PM 15.56 (H) 0.61 - 1.24 mg/dL Final  08/03/2016 09:11 PM 13.65 (H) 0.61 - 1.24 mg/dL Final  07/29/2016 09:09 AM 14.47 (H) 0.61 - 1.24 mg/dL Final  04/10/2016 01:37 AM 9.89 (H) 0.61 - 1.24 mg/dL Final    Comment:    DELTA CHECK NOTED  04/09/2016 04:20 AM 18.61 (H) 0.61 - 1.24 mg/dL Final  04/08/2016 12:46 PM 17.44 (H) 0.61 - 1.24 mg/dL Final  06/07/2015 06:50 PM 8.67 (H) 0.61 - 1.24 mg/dL Final  07/31/2014 10:02 PM 9.52 (H) 0.50 - 1.35  mg/dL Final  06/16/2013 11:50 PM 5.98 (H) 0.50 - 1.35 mg/dL Final  06/13/2013 03:50 AM 6.69 (H) 0.50 - 1.35 mg/dL Final    Comment:    DELTA CHECK NOTED  06/12/2013 10:25 AM 10.67 (H) 0.50 - 1.35 mg/dL Final  06/12/2013 03:11 AM 9.80 (H) 0.50 - 1.35 mg/dL Final  01/10/2013 07:06 AM 11.69 (H) 0.50 -  1.35 mg/dL Final  01/09/2013 10:15 AM 17.04 (H) 0.50 - 1.35 mg/dL Final  01/08/2013 10:15 PM 16.69 (H) 0.50 - 1.35 mg/dL Final  12/20/2012 10:01 AM 26.97 (H) 0.50 - 1.35 mg/dL Final  09/19/2012 03:00 PM 8.00 (H) 0.50 - 1.35 mg/dL Final  09/17/2012 11:08 AM 16.35 (H) 0.50 - 1.35 mg/dL Final  01/29/2012 07:17 PM 8.70 (H) 0.50 - 1.35 mg/dL Final  10/29/2011 01:08 PM 11.40 (H) 0.50 - 1.35 mg/dL Final  08/29/2011 09:28 AM 13.67 (H) 0.50 - 1.35 mg/dL Final  08/28/2011 09:40 AM 10.77 (H) 0.50 - 1.35 mg/dL Final  08/27/2011 08:10 PM 8.91 (H) 0.50 - 1.35 mg/dL Final  06/16/2011 06:15 AM 12.24 (H) 0.50 - 1.35 mg/dL Final  06/13/2011 05:35 AM 9.81 (H) 0.50 - 1.35 mg/dL Final   Recent Labs  Lab 03/31/18 0635 04/01/18 0800  NA 134* 137  K 3.7 4.3  CL 95* 101  CO2 24 25  GLUCOSE 107* 103*  BUN 80* 137*  CREATININE 8.69* 10.08*  CALCIUM 8.9 8.0*   Recent Labs  Lab 03/31/18 0635 03/31/18 2006 04/01/18 0518  WBC 8.2  --  8.8  HGB 5.4* 5.7* 5.6*  HCT 17.8* 17.4* 16.8*  MCV 101.1*  --  91.3  PLT 286  --  189   Liver Function Tests: Recent Labs  Lab 04/01/18 0800  AST 11*  ALT 7  ALKPHOS 44  BILITOT 0.4  PROT 4.3*  ALBUMIN 2.1*   No results for input(s): LIPASE, AMYLASE in the last 168 hours. No results for input(s): AMMONIA in the last 168 hours. Cardiac Enzymes: No results for input(s): CKTOTAL, CKMB, CKMBINDEX, TROPONINI in the last 168 hours. Iron Studies: No results for input(s): IRON, TIBC, TRANSFERRIN, FERRITIN in the last 72 hours. PT/INR: _0 (inr:5)  Xrays/Other Studies: ) Results for orders placed or performed during the hospital encounter of 03/31/18 (from  the past 48 hour(s))  Basic metabolic panel     Status: Abnormal   Collection Time: 03/31/18  6:35 AM  Result Value Ref Range   Sodium 134 (L) 135 - 145 mmol/L   Potassium 3.7 3.5 - 5.1 mmol/L   Chloride 95 (L) 98 - 111 mmol/L    Comment: Please note change in reference range.   CO2 24 22 - 32 mmol/L   Glucose, Bld 107 (H) 70 - 99 mg/dL    Comment: Please note change in reference range.   BUN 80 (H) 8 - 23 mg/dL    Comment: Please note change in reference range.   Creatinine, Ser 8.69 (H) 0.61 - 1.24 mg/dL   Calcium 8.9 8.9 - 10.3 mg/dL   GFR calc non Af Amer 6 (L) >60 mL/min   GFR calc Af Amer 7 (L) >60 mL/min    Comment: (NOTE) The eGFR has been calculated using the CKD EPI equation. This calculation has not been validated in all clinical situations. eGFR's persistently <60 mL/min signify possible Chronic Kidney Disease.    Anion gap 15 5 - 15    Comment: Performed at [facility] [address] [place], [place] [address]  CBC     Status: Abnormal   Collection Time: 03/31/18  6:35 AM  Result Value Ref Range   WBC 8.2 4.0 - 10.5 K/uL   RBC 1.76 (L) 4.22 - 5.81 MIL/uL   Hemoglobin 5.4 (LL) 13.0 - 17.0 g/dL    Comment: REPEATED TO VERIFY CRITICAL RESULT CALLED TO, READ BACK BY AND VERIFIED WITH: Lonia Farber [id number] Deaver  HCT 17.8 (L) 39.0 - 52.0 %   MCV 101.1 (H) 78.0 - 100.0 fL   MCH 30.7 26.0 - 34.0 pg   MCHC 30.3 30.0 - 36.0 g/dL   RDW 18.5 (H) 11.5 - 15.5 %   Platelets 286 150 - 400 K/uL    Comment: Performed at [facility] [address], [place], [place] [address]  I-Stat Troponin, ED (not at [facility])     Status: None   Collection Time: 03/31/18  6:58 AM  Result Value Ref Range   Troponin i, poc 0.04 0.00 - 0.08 ng/mL   Comment 3            Comment: Due to the release kinetics of cTnI, a negative result within the first hours of the onset of symptoms does not rule out myocardial infarction with certainty. If myocardial infarction is still  suspected, repeat the test at appropriate intervals.   CBG monitoring, ED     Status: Abnormal   Collection Time: 03/31/18  7:08 AM  Result Value Ref Range   Glucose-Capillary 100 (H) 70 - 99 mg/dL  Type and screen [place]     Status: None (Preliminary result)   Collection Time: 03/31/18  7:50 AM  Result Value Ref Range   ABO/RH(D) O NEG    Antibody Screen NEG    Sample Expiration 04/03/2018    Unit Number [id number]    Blood Component Type RBC LR PHER2    Unit division 00    Status of Unit ISSUED,FINAL    Transfusion Status OK TO TRANSFUSE    Crossmatch Result Compatible    Unit Number [id number]    Blood Component Type RED CELLS,LR    Unit division 00    Status of Unit ISSUED,FINAL    Transfusion Status OK TO TRANSFUSE    Crossmatch Result Compatible    Unit Number [id number]    Blood Component Type RED CELLS,LR    Unit division 00    Status of Unit ISSUED,FINAL    Transfusion Status OK TO TRANSFUSE    Crossmatch Result Compatible    Unit Number [id number]    Blood Component Type RED CELLS,LR    Unit division 00    Status of Unit ISSUED    Transfusion Status OK TO TRANSFUSE    Crossmatch Result      Compatible Performed at [facility], [place] [address] [place], [place] [address]   Direct antiglobulin test (not at [facility])     Status: None   Collection Time: 03/31/18  7:50 AM  Result Value Ref Range   DAT, complement NEG    DAT, IgG      NEG Performed at [facility] [address] [place], [place] [address]   Prepare RBC     Status: None   Collection Time: 03/31/18  7:54 AM  Result Value Ref Range   Order Confirmation      ORDER PROCESSED BY BLOOD BANK Performed at [facility], [place] [address], [place], [place] [address]   Hemoglobin and hematocrit, blood     Status: Abnormal   Collection Time: 03/31/18  8:06 PM  Result Value Ref Range   Hemoglobin 5.7 (LL) 13.0 - 17.0 g/dL    Comment: REPEATED TO  VERIFY CRITICAL VALUE NOTED.  VALUE IS CONSISTENT WITH PREVIOUSLY REPORTED AND CALLED VALUE.    HCT 17.4 (L) 39.0 - 52.0 %    Comment: Performed at [facility], [address] [place], [place] [address]  MRSA PCR Screening     Status: None   Collection Time: 03/31/18  9:49 PM  Result Value Ref Range   MRSA by PCR NEGATIVE NEGATIVE    Comment:        The GeneXpert MRSA Assay (FDA approved for NASAL specimens only), is one component of a comprehensive MRSA colonization surveillance program. It is not intended to diagnose MRSA infection nor to guide or monitor treatment for MRSA infections. Performed at [facility], [place] [address], [place], [place] [address]   Prepare RBC     Status: None   Collection Time: 03/31/18 11:37 PM  Result Value Ref Range   Order Confirmation      ORDER PROCESSED BY BLOOD BANK Performed at [facility], [place] [address], [place], [place] [address]   CBC     Status: Abnormal   Collection Time: 04/01/18  5:18 AM  Result Value Ref Range   WBC 8.8 4.0 - 10.5 K/uL   RBC 1.84 (L) 4.22 - 5.81 MIL/uL   Hemoglobin 5.6 (LL) 13.0 - 17.0 g/dL    Comment: REPEATED TO VERIFY CRITICAL VALUE NOTED.  VALUE IS CONSISTENT WITH PREVIOUSLY REPORTED AND CALLED VALUE.    HCT 16.8 (L) 39.0 - 52.0 %   MCV 91.3 78.0 - 100.0 fL   MCH 30.4 26.0 - 34.0 pg   MCHC 33.3 30.0 - 36.0 g/dL   RDW 17.9 (H) 11.5 - 15.5 %   Platelets 189 150 - 400 K/uL    Comment: Performed at [facility] [address] [place], [place] [address]  Reticulocytes     Status: Abnormal   Collection Time: 04/01/18  5:18 AM  Result Value Ref Range   Retic Ct Pct 4.5 (H) 0.4 - 3.1 %   RBC. 1.83 (L) 4.22 - 5.81 MIL/uL   Retic Count, Absolute 82.4 19.0 - 186.0 K/uL    Comment: Performed at [facility] [address] [place], [place] [address]  Prepare RBC     Status: None   Collection Time: 04/01/18  6:50 AM  Result Value Ref Range   Order Confirmation      ORDER  PROCESSED BY BLOOD BANK Performed at [facility], [place] [address] [place], [place] [address]   Occult blood card to lab, stool RN will collect     Status: Abnormal   Collection Time: 04/01/18  7:42 AM  Result Value Ref Range   Fecal Occult Bld POSITIVE (A) NEGATIVE    Comment: Performed at [facility] [address], [place], [place] [address]  Lactate dehydrogenase     Status: None   Collection Time: 04/01/18  8:00 AM  Result Value Ref Range   LDH 102 98 - 192 U/L    Comment: Performed at [facility], [place] [address], [place], [place] [address]  Save smear     Status: None   Collection Time: 04/01/18  8:00 AM  Result Value Ref Range   Smear Review SMEAR STAINED AND AVAILABLE FOR REVIEW     Comment: Performed at [facility], [place] [address] [place],  [address]  Comprehensive metabolic panel     Status: Abnormal   Collection Time: 04/01/18  8:00 AM  Result Value Ref Range   Sodium 137 135 - 145 mmol/L   Potassium 4.3 3.5 - 5.1 mmol/L   Chloride 101 98 - 111  mmol/L    Comment: Please note change in reference range.   CO2 25 22 - 32 mmol/L   Glucose, Bld 103 (H) 70 - 99 mg/dL    Comment: Please note change in reference range.   BUN 137 (H) 8 - 23 mg/dL    Comment: Please note change in reference range.   Creatinine, Ser 10.08 (H) 0.61 - 1.24 mg/dL   Calcium 8.0 (L) 8.9 - 10.3 mg/dL   Total Protein 4.3 (L) 6.5 - 8.1 g/dL   Albumin 2.1 (L) 3.5 - 5.0 g/dL   AST 11 (L) 15 - 41 U/L   ALT 7 0 - 44 U/L    Comment: Please note change in reference range.   Alkaline Phosphatase 44 38 - 126 U/L   Total Bilirubin 0.4 0.3 - 1.2 mg/dL   GFR calc non Af Amer 5 (L) >60 mL/min   GFR calc Af Amer 6 (L) >60 mL/min    Comment: (NOTE) The eGFR has been calculated using the CKD EPI equation. This calculation has not been validated in all clinical situations. eGFR's persistently <60 mL/min signify possible Chronic Kidney Disease.    Anion gap 11 5 - 15     Comment: Performed at [facility] [address], [place], [place] [address]   Ct Abdomen Pelvis W Contrast  Result Date: 04/01/2018 CLINICAL DATA:  Unexplained anemia. Patient going to dialysis today. Recent endoscopic removal of duodenal mass. Rule out occult bleeding. EXAM: CT ABDOMEN AND PELVIS WITH CONTRAST TECHNIQUE: Multidetector CT imaging of the abdomen and pelvis was performed using the standard protocol following bolus administration of intravenous contrast. CONTRAST:  159m OMNIPAQUE IOHEXOL 300 MG/ML  SOLN COMPARISON:  Feb 02, 2018 FINDINGS: Lower chest: The moderate left effusion is stable. Opacity in the left base and lingula is likely atelectasis. The 8 mm nodule in the left base is stable. A 7 mm nodule in the medial right base on series 4, image 12 is stable. No other abnormalities identified within the lung bases. Hepatobiliary: The portal vein is patent. Periportal edema is again identified. No focal liver mass is noted. The gallbladder is decompressed with a single stone near the neck. Mild prominence of the gallbladder wall could be due to poor distention. There is increased attenuation and a small amount of fluid adjacent to the gallbladder. However, the significance of this finding is unclear given ascites elsewhere. Pancreas: The pancreas is normal. Spleen: Normal in size without focal abnormality. Adrenals/Urinary Tract: Adrenal glands are normal. The kidneys are atrophic but stable. A probable cyst is seen in the left kidney, unchanged. No hydronephrosis. No ureteral stones. The bladder is decompressed but otherwise unremarkable. Stomach/Bowel: The distal esophagus and stomach are normal. The proximal third portion of the duodenum is mildly prominent in caliber. By report, the previously identified polyp has been removed. Low-attenuation in the proximal third portion of the duodenum may represent ingested material. There is no evidence of obstruction. It would be difficult to  evaluate for occult hemorrhage into the duodenum given recent follow-up removal. The remainder of the small bowel is normal. A metallic foreign body is seen in the sigmoid colon, likely ingested material of uncertain etiology. There is moderate fecal loading in the colon, decreased in the interval. Visualized appendix is normal. Vascular/Lymphatic: Dense atherosclerosis is seen in the nonaneurysmal aorta, iliac vessels, and femoral vessels. No adenopathy. Reproductive: Prostate is unremarkable. Other: There is a small amount of ascites in the abdomen, particularly adjacent to the  inferior liver and gallbladder. No suspicious fluid collections. Increased attenuation in the subcutaneous fat is suspicious for volume overload. No free air. Musculoskeletal: Degenerative changes in the visualized spine. Increased sclerosis in the bones, likely due to renal failure. No acute abnormalities in the bones. IMPRESSION: 1. No cause for the patient's anemia is identified. 2. There is mild prominence the gallbladder wall and mild pericholecystic fluid. The significance of these findings is unclear given ascites elsewhere and signs of volume overload. There is a stone in the neck of the gallbladder which is stable. An ultrasound could better evaluate the gallbladder if there is concern. 3. Stable moderate effusion in the left base with atelectasis. 4. Stable pulmonary nodules in the lung bases. The 8 mm nodule on the left is the larger of the 2 nodules. These nodules are stable since November 2017. A final follow-up in 1 year could ensure greater than 2 years of stability. 5. Dense atherosclerosis in the aorta and branching vessels. 6. Metallic foreign body in the sigmoid colon, likely ingested material. 7. Small ascites in the abdomen. 8. The proximal third portion of the duodenum is mildly prominent in caliber, of uncertain significance. No overt obstruction. Electronically Signed   By: Dorise Bullion III M.D   On: 04/01/2018  13:04    PMH:   Past Medical History:  Diagnosis Date  . Anemia   . Anxiety   . Arthritis   . Asthma    per pt hx  . End stage renal disease on home HD 07/10/2011   Started HD in September 2012 at [facility] with a tunneled HD catheter, now on home HD with NxtStage. Dialyzing through AVF L lower arm with buttonhole technique as of mid 2014. His brother does the HD treatments at home.  They are roommates for 23 years.  The brother works 3rd shift and gets off about 8am and then puts Mr Lamons on HD in the morning after getting home. Most of the time he does HD about 4 times a week, for about 4 hours per treatment. Cause of ESRD was HTN according to patient. He says he let his health go and ending up with complications, and that he didn't like seeing doctors in those days.  He says he was diagnosed with severe HTN when he lived in [place] in his 84's.   . Hepatitis B carrier ([place])   . HIV infection ([place])   . Hypertension   . Hyperthyroidism    normal now  . Pneumonia several yrs ago  . Seizure ([place])   . Seizures ([place]) 06/02/2011   x 1 none since  . Thrombocytopenia ([facility])     PSH:   Past Surgical History:  Procedure Laterality Date  . AV FISTULA PLACEMENT  06/02/11   Left radiocephalic AVF  . BIOPSY  02/17/2018   Procedure: BIOPSY;  Surgeon: Milus Banister, MD;  Location: [facility] ENDOSCOPY;  Service: Endoscopy;;  . COLONOSCOPY    . COLONOSCOPY WITH PROPOFOL N/A 01/27/2018   Procedure: COLONOSCOPY WITH PROPOFOL;  Surgeon: [name], MD;  Location: [facility] ENDOSCOPY;  Service: Gastroenterology;  Laterality: N/A;  . CORONARY ANGIOPLASTY    . ERCP  03/21/2018   AT [facility]  . ESOPHAGOGASTRODUODENOSCOPY N/A 02/17/2018   Procedure: ESOPHAGOGASTRODUODENOSCOPY (EGD);  Surgeon: Milus Banister, MD;  Location: [facility] ENDOSCOPY;  Service: Endoscopy;  Laterality: N/A;  . ESOPHAGOGASTRODUODENOSCOPY (EGD) WITH PROPOFOL N/A 01/27/2018   Procedure: ESOPHAGOGASTRODUODENOSCOPY (EGD) WITH PROPOFOL;  Surgeon:  [name], MD;  Location: [facility]  ENDOSCOPY;  Service: Gastroenterology;  Laterality: N/A;  . EUS N/A 02/17/2018   Procedure: UPPER ENDOSCOPIC ULTRASOUND (EUS) RADIAL;  Surgeon: Milus Banister, MD;  Location: [facility] ENDOSCOPY;  Service: Endoscopy;  Laterality: N/A;  . fistulaogram     x 2 last 2 years  . IR FLUORO GUIDE CV LINE RIGHT  09/16/2017  . IR US GUIDE VASC ACCESS RIGHT  09/16/2017  . IRRIGATION AND DEBRIDEMENT KNEE Left 09/15/2017   Procedure: IRRIGATION AND DEBRIDEMENT KNEE; arthroscopic clean out;  Surgeon: [name], MD;  Location: [facility] ORS;  Service: Orthopedics;  Laterality: Left;  . OTHER SURGICAL HISTORY     removal temporary HD catheter   . REVISON OF ARTERIOVENOUS FISTULA Left 10/10/2015   Procedure: REVISON OF LEFT RADIOCEPHALIC ARTERIOVENOUS FISTULA;  Surgeon: Angelia Mould, MD;  Location: [facility];  Service: Vascular;  Laterality: Left;  . REVISON OF ARTERIOVENOUS FISTULA Left 02/07/2016   Procedure: REPAIR OF PSEUDO-ANEUREYSM OF LEFT ARM  ARTERIOVENOUS FISTULA;  Surgeon: Angelia Mould, MD;  Location: [facility];  Service: Vascular;  Laterality: Left;  . REVISON OF ARTERIOVENOUS FISTULA Left 02/25/3015   Procedure: PLICATION OF LEFT ARM RADIOCEPHALIC ARTERIOVENOUS FISTULA PSEUDOANEURYSM;  Surgeon: Angelia Mould, MD;  Location: [facility];  Service: Vascular;  Laterality: Left;  . RIGHT/LEFT HEART CATH AND CORONARY ANGIOGRAPHY N/A 02/17/2017   Procedure: Right/Left Heart Cath and Coronary Angiography;  Surgeon: Sherren Mocha, MD;  Location: [facility];  Service: Cardiovascular;  Laterality: N/A;  . TEE WITHOUT CARDIOVERSION N/A 09/11/2016   Procedure: TRANSESOPHAGEAL ECHOCARDIOGRAM (TEE);  Surgeon: Dorothy Spark, MD;  Location: [facility];  Service: Cardiovascular;  Laterality: N/A;    Allergies:  Allergies  Allergen Reactions  . Lisinopril Anaphylaxis and Shortness Of Breath    Throat swelling  . Penicillins Anaphylaxis and Other (See Comments)     Childhood allergy Has patient had a PCN reaction causing immediate rash, facial/tongue/throat swelling, SOB or lightheadedness with hypotension: Yes Has patient had a PCN reaction causing severe rash involving mucus membranes or skin necrosis: Unk Has patient had a PCN reaction that required hospitalization: Unk Has patient had a PCN reaction occurring within the last 10 years: No If all of the above answers are "NO", then may proceed with Cephalosporin use.     Medications:   Prior to Admission medications   Medication Sig Start Date End Date Taking? Authorizing Provider  albuterol (PROVENTIL) (2.5 MG/3ML) 0.083% nebulizer solution Take 2.5 mg by nebulization every 6 (six) hours as needed for wheezing or shortness of breath.   Yes [provider]  ALPRAZolam (XANAX) 0.25 MG tablet Take 0.25 mg by mouth 3 (three) times daily as needed for anxiety.  11/29/17  Yes [provider]  amLODipine (NORVASC) 10 MG tablet Take 10 mg by mouth daily. 03/21/18  Yes [provider]  aspirin 81 MG chewable tablet Chew 1 tablet (81 mg total) by mouth daily. 10/15/17  Yes Vaughan Basta, MD  bictegravir-emtricitabine-tenofovir AF (BIKTARVY) 50-200-25 MG TABS tablet Take 1 tablet by mouth daily. 12/27/17  Yes Campbell Riches, MD  calcium acetate (PHOSLO) 667 MG capsule Take 667-2,001 mg by mouth See admin instructions. Take 1,334-2,001 mg three times a day with each meal and 667-1,334 mg with each snack 06/06/15  Yes [provider]  cloNIDine (CATAPRES - DOSED IN MG/24 HR) 0.1 mg/24hr patch UNWRAP AND APPLY 1 PATCH TO SKIN EVERY 7 DAYS 01/28/18  Yes Velna Ochs, MD  clotrimazole-betamethasone (LOTRISONE) cream Apply 1 application  topically 2 (two) times daily. Patient taking differently: Apply 1 application topically See admin instructions. Apply four days a week/twice a day as needed for irritation 09/08/17  Yes Campbell Riches, MD  doxazosin (CARDURA) 4 MG  tablet Take 4 mg by mouth at bedtime.  07/04/17  Yes [provider]  epoetin alfa (EPOGEN,PROCRIT) 2000 UNIT/ML injection Inject 2,000 Units into the vein 3 (three) times a week.    Yes [provider]  ethyl chloride spray Apply 1 application topically daily as needed (HD- to numb).  05/20/15  Yes [provider]  heparin 1000 UNIT/ML injection Inject 3,000 Units into the vein 4 (four) times a week.    Yes [provider]  hydrALAZINE (APRESOLINE) 25 MG tablet TAKE 3 TABLETS(75 MG) BY MOUTH EVERY 8 HOURS 03/25/18  Yes Velna Ochs, MD  Ipratropium-Albuterol (COMBIVENT IN) Inhale 2 puffs into the lungs every 6 (six) hours as needed (as needed for shortness of breath).    Yes [provider]  iron sucrose (VENOFER) 20 MG/ML injection Inject 100 mg into the vein every Monday.    Yes [provider]  megestrol (MEGACE) 40 MG tablet Take 2 tablets (80 mg total) by mouth 2 (two) times daily. 02/28/18  Yes Kathi Ludwig, MD  multivitamin (RENA-VIT) TABS tablet Take 1 tablet by mouth daily.     Yes [provider]  omeprazole (PRILOSEC) 20 MG capsule Take 1 capsule (20 mg total) by mouth daily. 01/31/18  Yes [name], MD  traZODone (DESYREL) 50 MG tablet TAKE 1 TABLET(50 MG) BY MOUTH AT BEDTIME AS NEEDED FOR SLEEP 01/31/18  Yes Campbell Riches, MD  VOLTAREN 1 % GEL APPLY 2 GRAMS EXTERNALLY TO THE AFFECTED AREA FOUR TIMES DAILY Patient taking differently: Apply 2 g topically 3 (three) times daily as needed (for pain).  09/08/17  Yes Campbell Riches, MD  carvedilol (COREG) 25 MG tablet TAKE 1 TABLET(25 MG) BY MOUTH TWICE DAILY WITH A MEAL 03/31/18   Velna Ochs, MD  morphine (MSIR) 15 MG tablet Take 1 tablet (15 mg total) by mouth every 6 (six) hours as needed for severe pain. Patient not taking: Reported on 03/31/2018 03/07/18   Carmin Muskrat, MD    Discontinued Meds:   Medications Discontinued During This Encounter   Medication Reason  . calcium acetate (PHOSLO) capsule 466-5,993 mg Duplicate  . heparin injection 5,000 Units     Social History:  reports that he quit smoking about 2 years ago. His smoking use included cigarettes. He quit after 10.00 years of use. He has never used smokeless tobacco. He reports that he does not drink alcohol or use drugs.  Family History:   Family History  Problem Relation Age of Onset  . Hypertension Mother   . Diabetes Mother   . Cancer Father     Blood pressure 136/66, pulse 90, temperature 99.4 F (37.4 C), temperature source Oral, resp. rate 20, height '6\' 1"'$  (1.854 m), weight 80 kg (176 lb 5.9 oz), SpO2 100 %. General appearance: alert, cooperative and appears stated age Head: Normocephalic, without obvious abnormality, atraumatic Eyes: negative Neck: no adenopathy, no carotid bruit, supple, symmetrical, trachea midline and thyroid not enlarged, symmetric, no tenderness/mass/nodules Back: symmetric, no curvature. ROM normal. No CVA tenderness. Resp: clear to auscultation bilaterally Chest wall: no tenderness Cardio: regular rate and rhythm, S1, S2 normal, no murmur, click, rub or gallop GI: soft, non-tender; bowel sounds normal; no masses,  no organomegaly Extremities: edema trace Pulses: 2+  and symmetric Skin: Skin color, texture, turgor normal. No rashes or lesions Lymph nodes: Cervical, supraclavicular, and axillary nodes normal. Neurologic: Grossly normal Access: Lt Cimino good bruit and thrill       Dwana Melena, MD 04/01/2018, 2:34 PM

## 2018-04-01 NOTE — Care Management Note (Signed)
Case Management Note  Patient Details  Name: ANTONY SIAN MRN: [id number] Date of Birth: 03-Mar-1956  Subjective/Objective:      Presents with sob, weakness / acute on chronic anemia, hx of ESRD ( on home HD) and anemia,, hypertension, 042.       [name] (Brother)     [phone]       PCP: Velna Ochs  Action/Plan: Discharge planning in process.... NCM following for disposition needs.  Expected Discharge Date:                  Expected Discharge Plan:  (Resides with brother, [name])  In-House Referral:     Discharge planning Services  CM Consult  Post Acute Care Choice:  Home Health, Resumption of Svcs/PTA Provider(Active with [facility] ( PT)) Choice offered to:     DME Arranged:  (owns cane, rolling walker) DME Agency:     HH Arranged:    HH Agency:     Status of Service:  In process, will continue to follow  If discussed at Long Length of Stay Meetings, dates discussed:    Additional Comments:  Sharin Mons, RN 04/01/2018, 8:26 AM

## 2018-04-01 NOTE — Progress Notes (Signed)
   Subjective: Jeffrey [name] was seen and evaluated at bedside this morning. He received 2 units of blood overnight. Follow-up Hgb was 5.6 so another unit was ordered early this morning. He states he is feeling better and told us he was disappointed he won't be going home. We explained that we needed to evaluate his low hemoglobin further. He was comforted by the fact that his brother could still cannulate him for HD. He denies any headaches, lightheadedness, weakness, shortness of breath, chest pain, palpitations, changes in bowel movements, or BRBPR.   Objective:  Vital signs in last 24 hours: Vitals:   03/31/18 2353 04/01/18 0020 04/01/18 0259 04/01/18 0449  BP: (!) 175/106 (!) 180/77 (!) 158/92 (!) 154/63  Pulse: 93 89 85 82  Resp: (!) 22 20  18   Temp: 99.8 F (37.7 C) 99.1 F (37.3 C) 99.6 F (37.6 C) 99.3 F (37.4 C)  TempSrc: Oral Oral Oral   SpO2: 100% 100% 100% 100%  Weight:      Height:       General: awake, alert, pleasant male lying in bed in NAD.  HEENT: conjunctival pallor. No scleral icterus.  Cardiovascular: RRR. SEM heard at RUSB. No gallops or rubs. AVF of RLE with palpable thrill. No peripheral edema Respiratory: No increased work of breathing. Lungs CTA.  Abdominal: BS +. Abdomen is soft, non-distended, non-tender.    Assessment/Plan:  Active Problems:   Acute on chronic anemia   Acute anemia  1. Acute on chronic symptomatic anemia. S/p 3 units PRBCs. Repeat H&H after 2 units revealed a hgb of 5.6. Will follow-up H&H after third unit. He receives epogen 4 times weekly during HD for his anemia of chronic disease. He did not receive any epogen during his admission on 6/24, but do not suspect this would cause a 3 point drop, as hgb day prior to procedure was 8. He is s/p ERCP on 7/1 in which he underwent biopsy of duodenal mass, partial mass removal and biliary stent placement at [facility]. Pathology still pending. Patient denies any symptoms of GI blood loss such as  melena, BRBPR, hematochezia, hematemesis. However, patient's FOBT is positive. Preliminary viewing of peripheral smear shows evidence of schistocytes and spherocytes broadening differential to hemolytic etiologies. LDH and bilirubin were normal. Retic count 4.5. Haptoglobin and direct antiglobulin test pending. An abdominal CT was done to rule out evidence of occult bleed.   Most likely etiology is combined anemia of chronic disease with slow upper GI bleed from duodenal mass and recent procedure. If his hgb is stable s/p transfusion, will likely discharge him for plan to follow-up with GI at [facility] as soon as possible. If he continues to drop, will consult GI here as inpatient.   2. ESRD, on home HD 4x/week  - crt this morning is 10 which appears to be around his baseline. Potassium normal.  - no signs of volume overload on exam.  - nephrology has been consulted for inpatient dialysis management.   3. HIV - CD4 count in 10/2017 was 390. Continue home antiviral medications.   Dispo: Anticipated discharge in approximately 1-2 day(s).   Modena Nunnery D, DO 04/01/2018, 11:42 AM Pager: [phone]

## 2018-04-02 ENCOUNTER — Encounter (HOSPITAL_COMMUNITY): Payer: Self-pay | Admitting: Gastroenterology

## 2018-04-02 LAB — HEMOGLOBIN AND HEMATOCRIT, BLOOD
HCT: 18.5 % — ABNORMAL LOW (ref 39.0–52.0)
HEMATOCRIT: 20.3 % — AB (ref 39.0–52.0)
Hemoglobin: 6.3 g/dL — CL (ref 13.0–17.0)
Hemoglobin: 7 g/dL — ABNORMAL LOW (ref 13.0–17.0)

## 2018-04-02 LAB — CBC
HCT: 17 % — ABNORMAL LOW (ref 39.0–52.0)
HEMOGLOBIN: 5.8 g/dL — AB (ref 13.0–17.0)
MCH: 30.5 pg (ref 26.0–34.0)
MCHC: 34.1 g/dL (ref 30.0–36.0)
MCV: 89.5 fL (ref 78.0–100.0)
Platelets: 138 10*3/uL — ABNORMAL LOW (ref 150–400)
RBC: 1.9 MIL/uL — AB (ref 4.22–5.81)
RDW: 16.4 % — ABNORMAL HIGH (ref 11.5–15.5)
WBC: 6.6 10*3/uL (ref 4.0–10.5)

## 2018-04-02 LAB — HAPTOGLOBIN: HAPTOGLOBIN: 124 mg/dL (ref 34–200)

## 2018-04-02 LAB — PREPARE RBC (CROSSMATCH)

## 2018-04-02 MED ORDER — SODIUM CHLORIDE 0.9% IV SOLUTION
Freq: Once | INTRAVENOUS | Status: AC
  Administered 2018-04-02: 09:00:00 via INTRAVENOUS

## 2018-04-02 MED ORDER — SODIUM CHLORIDE 0.9% IV SOLUTION: Freq: Once | INTRAVENOUS | Status: AC

## 2018-04-02 MED ORDER — PANTOPRAZOLE SODIUM 40 MG IV SOLR
40.0000 mg | Freq: Two times a day (BID) | INTRAVENOUS | Status: DC
Start: 2018-04-02 — End: 2018-04-04
  Administered 2018-04-02 – 2018-04-03 (×4): 40 mg via INTRAVENOUS
  Filled 2018-04-02 (×6): qty 40

## 2018-04-02 NOTE — Progress Notes (Signed)
NURSING PROGRESS NOTE  [name] [id number] Transfer Data: 04/02/2018 4:12 PM Attending Provider: Bartholomew Crews, MD BTD:[id number], Jeffrey Sauer, MD Code Status: Full   Jeffrey [name] is a 62 y.o. male patient transferred from [facility]  -No acute distress noted.  -No complaints of shortness of breath.  -No complaints of chest pain.   Cardiac Monitoring: Box # 0  in place. Cardiac monitor yields:normal sinus rhythm.  Blood pressure (!) 156/72, pulse 78, temperature 99.3 F (37.4 C), temperature source Oral, resp. rate 18, height 6\' 1"  (1.854 m), weight 77.8 kg (171 lb 8.3 oz), SpO2 99 %.   IV Fluids:  IV in place, occlusive dsg intact without redness, IV cath forearm.   Allergies:  Lisinopril and Penicillins  Past Medical History:   has a past medical history of Anemia, Anxiety, Arthritis, Asthma, End stage renal disease on home HD (07/10/2011), Hepatitis B carrier ([place]), HIV infection ([place]), Hypertension, Hyperthyroidism, Pneumonia (several yrs ago), Seizure ([place]), Seizures ([place]) (06/02/2011), and Thrombocytopenia ([place]).  Past Surgical History:   has a past surgical history that includes AV fistula placement (06/02/11); Other surgical history; Colonoscopy; Revison of arteriovenous fistula (Left, 10/10/2015); Revison of arteriovenous fistula (Left, 02/07/2016); Revison of arteriovenous fistula (Left, 04/10/2016); TEE without cardioversion (N/A, 09/11/2016); RIGHT/LEFT HEART CATH AND CORONARY ANGIOGRAPHY (N/A, 02/17/2017); fistulaogram; Irrigation and debridement knee (Left, 09/15/2017); IR Fluoro Guide CV Line Right (09/16/2017); IR US Guide Vasc Access Right (09/16/2017); Coronary angioplasty; Colonoscopy with propofol (N/A, 01/27/2018); Esophagogastroduodenoscopy (egd) with propofol (N/A, 01/27/2018); EUS (N/A, 02/17/2018); biopsy (02/17/2018); Esophagogastroduodenoscopy (N/A, 02/17/2018); and ERCP (03/21/2018).  Social History:   reports that he quit smoking about 2 years ago. His smoking use  included cigarettes. He quit after 10.00 years of use. He has never used smokeless tobacco. He reports that he does not drink alcohol or use drugs.  Skin: intact  Patient/Family orientated to room. Information packet given to patient/family. Admission inpatient armband information verified with patient/family to include name and date of birth and placed on patient arm. Side rails up x 2, fall assessment and education completed with patient/family. Patient/family able to verbalize understanding of risk associated with falls and verbalized understanding to call for assistance before getting out of bed. Call light within reach. Patient/family able to voice and demonstrate understanding of unit orientation instructions.    Will continue to evaluate and treat per MD orders.

## 2018-04-02 NOTE — Progress Notes (Signed)
Patient transferred from [place] to 5M15. Telemetry 5M15 placed on patient and central telemetry notified of application. Continuous O2. Skin assessed. Unremarkable. Will continue to monitor. Bartholomew Crews, RN'

## 2018-04-02 NOTE — Progress Notes (Signed)
  Date: 04/02/2018  Patient name: Jeffrey [name]  Medical record number: [id number]  Date of birth: 1955-10-30   I have seen and evaluated this patient and I have discussed the plan of care with the house staff. Please see their note for complete details. I concur with their findings with the following additions/corrections: Jeffrey [name] was seen on AM rounds with team.  He continues to feel poorly and not back to baseline.  Additionally, he had 3 foul-smelling melanotic stools which were large in quantity overnight.  His hemoglobin has not responded to 5 units of packed red blood cells and remains less than 6 due to the ongoing upper GI blood losses.  We will contact his gastroenterologist at [facility] to arrange inpatient inpatient transfer for further management.  Bartholomew Crews, MD 04/02/2018, 11:56 AM

## 2018-04-02 NOTE — Progress Notes (Signed)
HD tx ended @ 0050 (40 min early of the 3 hr he stated he was going to run of the 3.5 ordered) d/t continuing trouble w/ access running /machine alarming increased  AP. When I went in to try to trouble shoot the last time I disconnected the line set from the needle tubing and on the venous needle tubing /line set when it was unhooked there was a very large blood clot coming from the venous needle tubing to the venous hd line set and when it finally fell out of the venous needle tubing on to the chuck it appeared to be approximately 3-4" long.  I explained to pt that he would need to be re stuck to finish out the treatment and pt stated "im done, take me off". I re educated pt on the importance of finishing his tx and the possible consequences that could occur if he doesn't and he stated he would come back tomorrow that he didn't feel like being restuck and running the rest of the tx as he felt bad and it was so late. UF goal not met, blood rinsed back, VSS. Report called to Kathyrn Drown, RN

## 2018-04-02 NOTE — Discharge Summary (Signed)
Name: Jeffrey [name] MRN: [id number] DOB: Apr 21, 1956 62 y.o. PCP: Velna Ochs, MD  Date of Admission: 03/31/2018  6:10 AM Date of Discharge: 04/04/2018 Attending Physician: Bartholomew Crews, MD Discharge Diagnosis: 1. Acute on chronic symptomatic anemia 2. ESRD, on home HD 4x/week 3. HIV - CD4 count in 10/2017 was 390   Discharge Medications: Allergies as of 04/04/2018      Reactions   Lisinopril Anaphylaxis, Shortness Of Breath   Throat swelling   Penicillins Anaphylaxis, Other (See Comments)   Childhood allergy Has patient had a PCN reaction causing immediate rash, facial/tongue/throat swelling, SOB or lightheadedness with hypotension: Yes Has patient had a PCN reaction causing severe rash involving mucus membranes or skin necrosis: Unk Has patient had a PCN reaction that required hospitalization: Unk Has patient had a PCN reaction occurring within the last 10 years: No If all of the above answers are "NO", then may proceed with Cephalosporin use.      Medication List    STOP taking these medications   amLODipine 10 MG tablet Commonly known as:  NORVASC   aspirin 81 MG chewable tablet   doxazosin 4 MG tablet Commonly known as:  CARDURA   morphine 15 MG tablet Commonly known as:  MSIR     TAKE these medications   albuterol (2.5 MG/3ML) 0.083% nebulizer solution Commonly known as:  PROVENTIL Take 2.5 mg by nebulization every 6 (six) hours as needed for wheezing or shortness of breath.   ALPRAZolam 0.25 MG tablet Commonly known as:  XANAX Take 0.25 mg by mouth 3 (three) times daily as needed for anxiety.   bictegravir-emtricitabine-tenofovir AF 50-200-25 MG Tabs tablet Commonly known as:  BIKTARVY Take 1 tablet by mouth daily.   calcium acetate 667 MG capsule Commonly known as:  PHOSLO Take 667-2,001 mg by mouth See admin instructions. Take 1,334-2,001 mg three times a day with each meal and 667-1,334 mg with each snack   carvedilol 25 MG  tablet Commonly known as:  COREG Take 1 tablet (25 mg total) by mouth 2 (two) times daily with a meal.   cloNIDine 0.1 mg/24hr patch Commonly known as:  CATAPRES - Dosed in mg/24 hr UNWRAP AND APPLY 1 PATCH TO SKIN EVERY 7 DAYS   clotrimazole-betamethasone cream Commonly known as:  LOTRISONE Apply 1 application topically 2 (two) times daily. What changed:    when to take this  additional instructions   COMBIVENT IN Inhale 2 puffs into the lungs every 6 (six) hours as needed (as needed for shortness of breath).   epoetin alfa 2000 UNIT/ML injection Commonly known as:  EPOGEN,PROCRIT Inject 2,000 Units into the vein 3 (three) times a week.   ethyl chloride spray Apply 1 application topically daily as needed (HD- to numb).   heparin 1000 UNIT/ML injection Inject 3,000 Units into the vein 4 (four) times a week.   hydrALAZINE 25 MG tablet Commonly known as:  APRESOLINE TAKE 3 TABLETS(75 MG) BY MOUTH EVERY 8 HOURS   iron sucrose 20 MG/ML injection Commonly known as:  VENOFER Inject 100 mg into the vein every Monday.   megestrol 40 MG tablet Commonly known as:  MEGACE Take 2 tablets (80 mg total) by mouth 2 (two) times daily.   multivitamin Tabs tablet Take 1 tablet by mouth daily.   omeprazole 20 MG capsule Commonly known as:  PRILOSEC Take 1 capsule (20 mg total) by mouth daily.   traZODone 50 MG tablet Commonly known as:  DESYREL TAKE 1 TABLET(50 MG)  BY MOUTH AT BEDTIME AS NEEDED FOR SLEEP   VOLTAREN 1 % Gel Generic drug:  diclofenac sodium APPLY 2 GRAMS EXTERNALLY TO THE AFFECTED AREA FOUR TIMES DAILY What changed:    how much to take  how to take this  when to take this  reasons to take this  additional instructions       Disposition and follow-up:   Jeffrey [name] was discharged from [facility] in Stable condition.  At the hospital follow up visit please address:  1.  Jeffrey [name] was seen for acute on chronic symptomatic  anemia 2/2 upper GI bleed. He received a total of 7 units pRBCs during his admission. Please ensure his hgb is stable and that he has followed up with GI at [facility]. He has a history of recent NSTEMI 2/2 hypertensive emergency. Aspirin was held in the setting of GI bleed until follow-up with [facility].   2.  Labs / imaging needed at time of follow-up: CBC, BMP  3.  Pending labs/ test needing follow-up: repeat CT in 1 year to monitor stable pulmonary nodules; pathology of duodenal mass biopsy done at [facility].  Follow-up Appointments: Follow-up Information    [place]. Go on 04/08/2018.   Why:  At 10:45am Contact information: [address] [place] Hospital Course by problem list: 1. Acute on chronic symptomatic anemia 2/2 likely upper GI bleed. Jeffrey [name] presented with progressive shortness of breath and weakness for the past week. He is s/p ERCPon 7/1 in which he underwent biopsy of duodenal mass, partial [facility]. Pathology still pending. He receives epogen with HD 4x weekly, which he noted he did not get during his admission from 6-24-7/1 when he had his procedure. Hgb on admission was 5.4. S/p 7 units of pRBCs. Hgb increased to 8.7 which remained stable for over 24 hours. He has remained hemodynamically stable since admission. When he first presented, he originally denied any signs or symptoms of GI blood loss. However, FOBT was positive. Hemolytic work-up was initiated given lack of GI symptoms, but was negative. On HD #2, he endorsed a large volume of foul-smelling, melanotic stools. He was started on IV protonix and GI was consulted. They performed an EGD 7/14 which revealed no signs of active GI bleed and recommendations to follow-up with GI outpatient at [facility] as scheduled. He was discharged home after hgb remained stable for 24 hours with follow-up appointment at [facility] for CBC  recheck in 1 week.    2. ESRD, on home HD 4x/week  Patient was managed by nephrology for inpatient dialysis. He was dialyzed x1 this admission. Volume status and electrolytes remained stable.   3. Recent NSTEMI 2/2 hypertensive emergency at [facility] admission on 6/24. Patient was fairly hypertensive throughout admission, but denied any headaches, confusion, chest pain, or shortness of breath. He was continued on home hydralazine and coreg. We did not escalate his therapy in the setting of GI bleed. May need regimen adjusted outpatient. We held his aspirin until he follows up with GI at [facility] on 8/1.   3. HIV (last CD4 count in 10/2017 was 390). His home antivirals were continued.    Discharge Vitals:   BP (!) 160/69 (BP Location: Left Arm)   Pulse 80   Temp 98.6 F (37 C) (Oral)   Resp 20   Ht 6\' 1"  (1.854 m)  Wt 171 lb 8.3 oz (77.8 kg)   SpO2 100%   BMI 22.63 kg/m   Pertinent Labs, Studies, and Procedures:  CBC Latest Ref Rng & Units 04/04/2018 04/03/2018 04/03/2018  WBC 4.0 - 10.5 K/uL 6.5 - 7.3  Hemoglobin 13.0 - 17.0 g/dL 8.7(L) 9.7(L) 8.8(L)  Hematocrit 39.0 - 52.0 % 25.7(L) 28.5(L) 26.4(L)  Platelets 150 - 400 K/uL 120(L) - 134(L)    Discharge Instructions: Discharge Instructions    Call MD for:  difficulty breathing, headache or visual disturbances   Complete by:  As directed    Call MD for:  extreme fatigue   Complete by:  As directed    Call MD for:  hives   Complete by:  As directed    Call MD for:  persistant dizziness or light-headedness   Complete by:  As directed    Call MD for:  persistant nausea and vomiting   Complete by:  As directed    Call MD for:  redness, tenderness, or signs of infection (pain, swelling, redness, odor or green/yellow discharge around incision site)   Complete by:  As directed    Call MD for:  severe uncontrolled pain   Complete by:  As directed    Call MD for:  temperature >100.4   Complete by:  As directed    Diet - low sodium heart  healthy   Complete by:  As directed    Discharge instructions   Complete by:  As directed    Please make an appointment in the [place] in 1 week to check your blood counts. If you have shortness of breath, chest pain, recurrence of your black stools or bloody stools, please come in sooner or to go the ED. Stop taking your aspirin until you are seen by your stomach doctor again at [facility]   Increase activity slowly   Complete by:  As directed       Signed: Delice Bison, DO 04/04/2018, 4:20 PM   Pager: [phone]

## 2018-04-02 NOTE — Progress Notes (Addendum)
   Subjective: [name] was seen and evaluated at bedside. He notes he still feels poorly with fatigue and weakness. He also notes 3 large, foul-smelling melanotic bowel movements since yesterday afternoon. He was dialyzed last night and was glad to have his brother there to cannulate him. He denies any headaches, chest pain, palpitations, abdominal pain.   Objective:  Vital signs in last 24 hours: Vitals:   04/02/18 0824 04/02/18 0855 04/02/18 1126 04/02/18 1353  BP: (!) 161/86 (!) 167/80 (!) 145/74 (!) 156/72  Pulse: 84 82 82 78  Resp: 16 17 18 18   Temp: 98.4 F (36.9 C) 98.2 F (36.8 C) 98.8 F (37.1 C) 99.3 F (37.4 C)  TempSrc: Oral Oral Oral Oral  SpO2: 100% 100% 100% 99%  Weight:      Height:       General: awake, alert, tired appearing man lying in bed in NAD HEENT: conjunctival pallor.  Cardiovascular: RRR. SEM heard at upper sternal borders. No gallops or rubs. No peripheral edema Respiratory: no increased work of breathing. Lungs CTA Abdominal: Bowel sounds +. Abdomen is soft, non-distended, non-tender.    Assessment/Plan:  Active Problems:   Acute on chronic anemia   Acute anemia  1. Acute on chronic symptomatic anemia. S/p 5 units PRBCs. Repeat H&H after the 5th unit shows improvement in hgb from 5.8-6.3.  He is s/p ERCP on 7/1 in which he underwent biopsy of duodenal mass, partial mass removal and biliary stent placement at [facility] Pathology still pending. Patient now endorsing large volume of melanotic stools for the last day; originally, denied any melena, hematochezia or BRBPR on admission.  Hemolytic work-up negative thus far Most likely etiology is combined anemia of chronic disease with slow upper GI bleed from duodenal mass and recent procedure.  Started on IV protonix 40 mg bid.  GI has been consulted; they have placed him NPO for EGD tomorrow morning. Will follow-up on plans and recommendations based on findings.     2. ESRD, on home HD 4x/week    - nephrology is following for inpatient dialysis management  - no signs of volume overload on exam.   3. HIV - CD4 count in 10/2017 was 390. Continue home antiviral medications.    Dispo: Anticipated discharge in approximately 2-3 day(s).   Modena Nunnery D, DO 04/02/2018, 3:14 PM Pager: [phone]

## 2018-04-02 NOTE — Progress Notes (Signed)
HD tx initiated via 15G buttonhole x3. This nurse was unable to access arterial aspect of AVF, no flash, when needle pulled and flushed 3 clots were produced. Dr. Augustin Coupe was called and made aware. While on the phone w/ Dr. Augustin Coupe the pt called his brother who normally sticks him to come and stick him. Dr. Augustin Coupe arrived at the same time brother did and he assessed AVF w/ ultrasound prior to brother sticking him. Brother stuck x2 successfully, pull/push/flush well, and tx was able to be initiated. Pt is to get 1 unit PRBC while on HD tx, VSS, will cont to monitor while on HD tx

## 2018-04-02 NOTE — Progress Notes (Signed)
[name] Progress Note    Assessment/ Plan:    62 y.o. male ESRD HTN HIV with known duodenal mass who recently had ERCP attempt at removal at [facility] 6/24-03/21/2018. He had partial removal of the mass.  After d/c he developed symp anemia; h/o [place] with HD and was thus released from [place] and now on home HD with brother cannulating the button holes. His EDW is 76kg and last HD was Tuesday.    1. ESRD - Last HD Tuesday at home and terribly [place] with treatments which was why he was released from [place]. - s/p HD late last night; appreciate the brother helping w/ cannulation of the button holes which he does all the time at home. - Check phos and chemistry panel after the transfusions.  - May need HD tomorrow (will reassess); o/w certainly on Monday.   2. 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 3. HIV 4. Anemia - transfused multiple units with inadequate response due to likely ongoing blood loss. May need GI to scope again ([place] or [facility] to be determined); he was a complicated case and went to [facility] for ERCP partial resection of duodenal mass.    Subjective:   Continues to have dark black foul smelling stool. Denies dizziness or dyspnea or nausea.  Tolerated HD late last night.   Objective:   BP (!) 167/80   Pulse 82   Temp 98.2 F (36.8 C) (Oral)   Resp 17   Ht 6\' 1"  (1.854 m)   Wt 77.8 kg (171 lb 8.3 oz)   SpO2 100%   BMI 22.63 kg/m   Intake/Output Summary (Last 24 hours) at 04/02/2018 1042 Last data filed at 04/02/2018 6378 Gross per 24 hour  Intake 741.67 ml  Output 1208 ml  Net -466.33 ml   Weight change: 1.6 kg (3 lb 8.4 oz)  Physical Exam: General appearance: NCAT Resp: clear to auscultation bilaterally Cardio: regular rate and rhythm, S1, S2 normal, no murmur, click, rub or gallop GI: soft, non-tender; bowel sounds normal; no masses,  no organomegaly Extremities: edema trace Pulses: 2+ and symmetric Neurologic: Grossly  normal Access: Lt Cimino good bruit and thrill    Imaging: Ct Abdomen Pelvis W Contrast  Result Date: 04/01/2018 CLINICAL DATA:  Unexplained anemia. Patient going to dialysis today. Recent endoscopic removal of duodenal mass. Rule out occult bleeding. EXAM: CT ABDOMEN AND PELVIS WITH CONTRAST TECHNIQUE: Multidetector CT imaging of the abdomen and pelvis was performed using the standard protocol following bolus administration of intravenous contrast. CONTRAST:  121mL OMNIPAQUE IOHEXOL 300 MG/ML  SOLN COMPARISON:  Feb 02, 2018 FINDINGS: Lower chest: The moderate left effusion is stable. Opacity in the left base and lingula is likely atelectasis. The 8 mm nodule in the left base is stable. A 7 mm nodule in the medial right base on series 4, image 12 is stable. No other abnormalities identified within the lung bases. Hepatobiliary: The portal vein is patent. Periportal edema is again identified. No focal liver mass is noted. The gallbladder is decompressed with a single stone near the neck. Mild prominence of the gallbladder wall could be due to poor distention. There is increased attenuation and a small amount of fluid adjacent to the gallbladder. However, the significance of this finding is unclear given ascites elsewhere. Pancreas: The pancreas is normal. Spleen: Normal in size without focal abnormality. Adrenals/Urinary Tract: Adrenal glands are normal. The kidneys are atrophic but stable. A probable cyst is seen in  the left kidney, unchanged. No hydronephrosis. No ureteral stones. The bladder is decompressed but otherwise unremarkable. Stomach/Bowel: The distal esophagus and stomach are normal. The proximal third portion of the duodenum is mildly prominent in caliber. By report, the previously identified polyp has been removed. Low-attenuation in the proximal third portion of the duodenum may represent ingested material. There is no evidence of obstruction. It would be difficult to evaluate for occult  hemorrhage into the duodenum given recent follow-up removal. The remainder of the small bowel is normal. A metallic foreign body is seen in the sigmoid colon, likely ingested material of uncertain etiology. There is moderate fecal loading in the colon, decreased in the interval. Visualized appendix is normal. Vascular/Lymphatic: Dense atherosclerosis is seen in the nonaneurysmal aorta, iliac vessels, and femoral vessels. No adenopathy. Reproductive: Prostate is unremarkable. Other: There is a small amount of ascites in the abdomen, particularly adjacent to the inferior liver and gallbladder. No suspicious fluid collections. Increased attenuation in the subcutaneous fat is suspicious for volume overload. No free air. Musculoskeletal: Degenerative changes in the visualized spine. Increased sclerosis in the bones, likely due to renal failure. No acute abnormalities in the bones. IMPRESSION: 1. No cause for the patient's anemia is identified. 2. There is mild prominence the gallbladder wall and mild pericholecystic fluid. The significance of these findings is unclear given ascites elsewhere and signs of volume overload. There is a stone in the neck of the gallbladder which is stable. An ultrasound could better evaluate the gallbladder if there is concern. 3. Stable moderate effusion in the left base with atelectasis. 4. Stable pulmonary nodules in the lung bases. The 8 mm nodule on the left is the larger of the 2 nodules. These nodules are stable since November 2017. A final follow-up in 1 year could ensure greater than 2 years of stability. 5. Dense atherosclerosis in the aorta and branching vessels. 6. Metallic foreign body in the sigmoid colon, likely ingested material. 7. Small ascites in the abdomen. 8. The proximal third portion of the duodenum is mildly prominent in caliber, of uncertain significance. No overt obstruction. Electronically Signed   By: Dorise Bullion III M.D   On: 04/01/2018 13:04     Labs: BMET Recent Labs  Lab 03/31/18 0635 04/01/18 0800  NA 134* 137  K 3.7 4.3  CL 95* 101  CO2 24 25  GLUCOSE 107* 103*  BUN 80* 137*  CREATININE 8.69* 10.08*  CALCIUM 8.9 8.0*  PHOS  --  <1.0*   CBC Recent Labs  Lab 03/31/18 0635 03/31/18 2006 04/01/18 0518 04/01/18 2102 04/02/18 0412  WBC 8.2  --  8.8  --  6.6  HGB 5.4* 5.7* 5.6* 5.4* 5.8*  HCT 17.8* 17.4* 16.8* 16.0* 17.0*  MCV 101.1*  --  91.3  --  89.5  PLT 286  --  189  --  138*    Medications:    . sodium chloride   Intravenous Once  . sodium chloride   Intravenous Once  . bictegravir-emtricitabine-tenofovir AF  1 tablet Oral Daily  . calcium acetate  1,334-2,001 mg Oral TID WC   And  . calcium acetate  667-1,334 mg Oral With snacks  . carvedilol  25 mg Oral BID WC  . heparin  3,000 Units Dialysis Once in dialysis  . hydrALAZINE  75 mg Oral Q8H  . megestrol  80 mg Oral BID  . multivitamin  1 tablet Oral Daily  . traZODone  50 mg Oral QHS  Otelia Santee, MD 04/02/2018, 10:42 AM

## 2018-04-02 NOTE — Consult Note (Signed)
Reason for Consult:melena anemia Referring Physician: hospital team  Jeffrey [name] is an 62 y.o. male.  HPI: patient seen and examined and case discussed with his brother who helps him with home dialysis and his hospital computer chart reviewed and his case discussed with the hospital team and his been having black stools for 3 days and had a ampullectomy at [facility] on July 3 and has been taking his aspirin at home and denies any other previous GI issues and his endoscopy and colonoscopy in [place] were reviewed as well as the pathology and he is actually hungry and wants to eat which we discussed and unfortunately he ate breakfast and has been sipping on fluid since  Past Medical History:  Diagnosis Date  . Anemia   . Anxiety   . Arthritis   . Asthma    per pt hx  . End stage renal disease on home HD 07/10/2011   Started HD in September 2012 at [facility] with a tunneled HD catheter, now on home HD with N[facility]Stage. Dialyzing through AVF L lower arm with buttonhole technique as of mid 2014. His brother does the HD treatments at home.  They are roommates for 23 years.  The brother works 3rd shift and gets off about 8am and then puts Jeffrey [name] on HD in the morning after getting home. Most of the time he does HD about 4 times a week, for about 4 hours per treatment. Cause of ESRD was HTN according to patient. He says he let his health go and ending up with complications, and that he didn't like seeing doctors in those days.  He says he was diagnosed with severe HTN when he lived in [place] in his 67's.   . Hepatitis B carrier ([place])   . HIV infection ([place])   . Hypertension   . Hyperthyroidism    normal now  . Pneumonia several yrs ago  . Seizure ([place])   . Seizures ([place]) 06/02/2011   x 1 none since  . Thrombocytopenia ([place])     Past Surgical History:  Procedure Laterality Date  . AV FISTULA PLACEMENT  06/02/11   Left radiocephalic AVF  . BIOPSY  02/17/2018   Procedure: BIOPSY;  Surgeon:  Milus Banister, MD;  Location: [facility] ENDOSCOPY;  Service: Endoscopy;;  . COLONOSCOPY    . COLONOSCOPY WITH PROPOFOL N/A 01/27/2018   Procedure: COLONOSCOPY WITH PROPOFOL;  Surgeon: [name], MD;  Location: [facility] ENDOSCOPY;  Service: Gastroenterology;  Laterality: N/A;  . CORONARY ANGIOPLASTY    . ERCP  03/21/2018   AT [place]  . ESOPHAGOGASTRODUODENOSCOPY N/A 02/17/2018   Procedure: ESOPHAGOGASTRODUODENOSCOPY (EGD);  Surgeon: Milus Banister, MD;  Location: [facility] END[facility];  Service: Endoscopy;  Laterality: N/A;  . ESOPHAGOGASTRODUODENOSCOPY (EGD) WITH PROPOFOL N/A 01/27/2018   Procedure: ESOPHAGOGASTRODUODENOSCOPY (EGD) WITH PROPOFOL;  Surgeon: [name], MD;  Location: [facility] ENDOSCOPY;  Service: Gastroenterology;  Laterality: N/A;  . EUS N/A 02/17/2018   Procedure: UPPER ENDOSCOPIC ULTRASOUND (EUS) RADIAL;  Surgeon: Milus Banister, MD;  Location: [facility] ENDOS[facility];  Service: Endoscopy;  Laterality: N/A;  . fistulaogram     x 2 last 2 years  . IR FLUORO GUIDE CV LINE RIGHT  09/16/2017  . IR US GUIDE VASC ACCESS RIGHT  09/16/2017  . IRRIGATION AND DEBRIDEMENT KNEE Left 09/15/2017   Procedure: IRRIGATION AND DEBRIDEMENT KNEE; arthroscopic clean out;  Surgeon: [name], MD;  Location: [facility] ORS;  Service: Orthopedics;  Laterality: Left;  . OTHER SURGICAL HISTORY  removal temporary HD catheter   . REVISON OF ARTERIOVENOUS FISTULA Left 10/10/2015   Procedure: REVISON OF LEFT RADIOCEPHALIC ARTERIOVENOUS FISTULA;  Surgeon: Angelia Mould, MD;  Location: [place];  Service: Vascular;  Laterality: Left;  . REVISON OF ARTERIOVENOUS FISTULA Left 02/07/2016   Procedure: REPAIR OF PSEUDO-ANEUREYSM OF LEFT ARM  ARTERIOVENOUS FISTULA;  Surgeon: Angelia Mould, MD;  Location: [place];  Service: Vascular;  Laterality: Left;  . REVISON OF ARTERIOVENOUS FISTULA Left 0/56/9794   Procedure: PLICATION OF LEFT ARM RADIOCEPHALIC ARTERIOVENOUS FISTULA PSEUDOANEURYSM;  Surgeon: Angelia Mould, MD;   Location: [place];  Service: Vascular;  Laterality: Left;  . RIGHT/LEFT HEART CATH AND CORONARY ANGIOGRAPHY N/A 02/17/2017   Procedure: Right/Left Heart Cath and Coronary Angiography;  Surgeon: Sherren Mocha, MD;  Location: [place] CV LAB;  Service: Cardiovascular;  Laterality: N/A;  . TEE WITHOUT CARDIOVERSION N/A 09/11/2016   Procedure: TRANSESOPHAGEAL ECHOCARDIOGRAM (TEE);  Surgeon: Dorothy Spark, MD;  Location: [facility] ENDOSCOPY;  Service: Cardiovascular;  Laterality: N/A;    Family History  Problem Relation Age of Onset  . Hypertension Mother   . Diabetes Mother   . Cancer Father     Social History:  reports that he quit smoking about 2 years ago. His smoking use included cigarettes. He quit after 10.00 years of use. He has never used smokeless tobacco. He reports that he does not drink alcohol or use drugs.  Allergies:  Allergies  Allergen Reactions  . Lisinopril Anaphylaxis and Shortness Of Breath    Throat swelling  . Penicillins Anaphylaxis and Other (See Comments)    Childhood allergy Has patient had a PCN reaction causing immediate rash, facial/tongue/throat swelling, SOB or lightheadedness with hypotension: Yes Has patient had a PCN reaction causing severe rash involving mucus membranes or skin necrosis: Unk Has patient had a PCN reaction that required hospitalization: Unk Has patient had a PCN reaction occurring within the last 10 years: No If all of the above answers are "NO", then may proceed with Cephalosporin use.     Medications: I have reviewed the patient's current medications.  Results for orders placed or performed during the hospital encounter of 03/31/18 (from the past 48 hour(s))  Hemoglobin and hematocrit, blood     Status: Abnormal   Collection Time: 03/31/18  8:06 PM  Result Value Ref Range   Hemoglobin 5.7 (LL) 13.0 - 17.0 g/dL    Comment: REPEATED TO VERIFY CRITICAL VALUE NOTED.  VALUE IS CONSISTENT WITH PREVIOUSLY REPORTED AND CALLED VALUE.     HCT 17.4 (L) 39.0 - 52.0 %    Comment: Performed at [facility], [place] [address], [place], [place] [address]  MRSA PCR Screening     Status: None   Collection Time: 03/31/18  9:49 PM  Result Value Ref Range   MRSA by PCR NEGATIVE NEGATIVE    Comment:        The GeneXpert MRSA Assay (FDA approved for NASAL specimens only), is one component of a comprehensive MRSA colonization surveillance program. It is not intended to diagnose MRSA infection nor to guide or monitor treatment for MRSA infections. Performed at [facility], [place] [address], [place], [place] [address]   Prepare RBC     Status: None   Collection Time: 03/31/18 11:37 PM  Result Value Ref Range   Order Confirmation      ORDER PROCESSED BY BLOOD BANK Performed at [facility], [place] [address], [place], [place] [address]  CBC     Status: Abnormal   Collection Time: 04/01/18  5:18 AM  Result Value Ref Range   WBC 8.8 4.0 - 10.5 K/uL   RBC 1.84 (L) 4.22 - 5.81 MIL/uL   Hemoglobin 5.6 (LL) 13.0 - 17.0 g/dL    Comment: REPEATED TO VERIFY CRITICAL VALUE NOTED.  VALUE IS CONSISTENT WITH PREVIOUSLY REPORTED AND CALLED VALUE.    HCT 16.8 (L) 39.0 - 52.0 %   MCV 91.3 78.0 - 100.0 fL   MCH 30.4 26.0 - 34.0 pg   MCHC 33.3 30.0 - 36.0 g/dL   RDW 17.9 (H) 11.5 - 15.5 %   Platelets 189 150 - 400 K/uL    Comment: Performed at [facility] [address], [place], [place] [address]  Reticulocytes     Status: Abnormal   Collection Time: 04/01/18  5:18 AM  Result Value Ref Range   Retic Ct Pct 4.5 (H) 0.4 - 3.1 %   RBC. 1.83 (L) 4.22 - 5.81 MIL/uL   Retic Count, Absolute 82.4 19.0 - 186.0 K/uL    Comment: Performed at [facility] [address], [place], [place] [address]  Prepare RBC     Status: None   Collection Time: 04/01/18  6:50 AM  Result Value Ref Range   Order Confirmation      ORDER PROCESSED BY BLOOD BANK Performed at [facility], [place] [address], [place], [place]  [address]   Occult blood card to lab, stool RN will collect     Status: Abnormal   Collection Time: 04/01/18  7:42 AM  Result Value Ref Range   Fecal Occult Bld POSITIVE (A) NEGATIVE    Comment: Performed at [facility] [address], [place], [place] [address]  Lactate dehydrogenase     Status: None   Collection Time: 04/01/18  8:00 AM  Result Value Ref Range   LDH 102 98 - 192 U/L    Comment: Performed at [facility], [place] [address], [place], [place] [address]  Save smear     Status: None   Collection Time: 04/01/18  8:00 AM  Result Value Ref Range   Smear Review SMEAR STAINED AND AVAILABLE FOR REVIEW     Comment: Performed at [facility], [place] [address] [place], [place] [address]  Haptoglobin     Status: None   Collection Time: 04/01/18  8:00 AM  Result Value Ref Range   Haptoglobin 124 34 - 200 mg/dL    Comment: (NOTE) Performed At: [facility] [place], [place] [id number] Rush Farmer MD AS:[id number]   Comprehensive metabolic panel     Status: Abnormal   Collection Time: 04/01/18  8:00 AM  Result Value Ref Range   Sodium 137 135 - 145 mmol/L   Potassium 4.3 3.5 - 5.1 mmol/L   Chloride 101 98 - 111 mmol/L    Comment: Please note change in reference range.   CO2 25 22 - 32 mmol/L   Glucose, Bld 103 (H) 70 - 99 mg/dL    Comment: Please note change in reference range.   BUN 137 (H) 8 - 23 mg/dL    Comment: Please note change in reference range.   Creatinine, Ser 10.08 (H) 0.61 - 1.24 mg/dL   Calcium 8.0 (L) 8.9 - 10.3 mg/dL   Total Protein 4.3 (L) 6.5 - 8.1 g/dL   Albumin 2.1 (L) 3.5 - 5.0 g/dL   AST 11 (  L) 15 - 41 U/L   ALT 7 0 - 44 U/L    Comment: Please note change in reference range.   Alkaline Phosphatase 44 38 - 126 U/L   Total Bilirubin 0.4 0.3 - 1.2 mg/dL   GFR calc non Af Amer 5 (L) >60 mL/min   GFR calc Af Amer 6 (L) >60 mL/min    Comment: (NOTE) The eGFR has been calculated using the CKD EPI equation. This  calculation has not been validated in all clinical situations. eGFR's persistently <60 mL/min signify possible Chronic Kidney Disease.    Anion gap 11 5 - 15    Comment: Performed at [facility] [address] [place], [place] [address]  Phosphorus     Status: Abnormal   Collection Time: 04/01/18  8:00 AM  Result Value Ref Range   Phosphorus <1.0 (LL) 2.5 - 4.6 mg/dL    Comment: REPEATED TO VERIFY CRITICAL RESULT CALLED TO, READ BACK BY AND VERIFIED WITH: K PRICE,RN 1521 04/01/18 D BRADLEY Performed at [facility] [address] [place], [place] [address]   Hemoglobin and hematocrit, blood     Status: Abnormal   Collection Time: 04/01/18  9:02 PM  Result Value Ref Range   Hemoglobin 5.4 (LL) 13.0 - 17.0 g/dL    Comment: REPEATED TO VERIFY CRITICAL VALUE NOTED.  VALUE IS CONSISTENT WITH PREVIOUSLY REPORTED AND CALLED VALUE.    HCT 16.0 (L) 39.0 - 52.0 %    Comment: Performed at [facility], [place] [address] [place], [place] [address]  Prepare RBC     Status: None   Collection Time: 04/01/18  9:58 PM  Result Value Ref Range   Order Confirmation      ORDER PROCESSED BY BLOOD BANK Performed at [facility], [place] [address] [place], [place] [address]   CBC     Status: Abnormal   Collection Time: 04/02/18  4:12 AM  Result Value Ref Range   WBC 6.6 4.0 - 10.5 K/uL   RBC 1.90 (L) 4.22 - 5.81 MIL/uL   Hemoglobin 5.8 (LL) 13.0 - 17.0 g/dL    Comment: REPEATED TO VERIFY CRITICAL VALUE NOTED.  VALUE IS CONSISTENT WITH PREVIOUSLY REPORTED AND CALLED VALUE.    HCT 17.0 (L) 39.0 - 52.0 %   MCV 89.5 78.0 - 100.0 fL   MCH 30.5 26.0 - 34.0 pg   MCHC 34.1 30.0 - 36.0 g/dL   RDW 16.4 (H) 11.5 - 15.5 %   Platelets 138 (L) 150 - 400 K/uL    Comment: Performed at [facility] [address], [place], [place] [address]  Prepare RBC     Status: None   Collection Time: 04/02/18  6:23 AM  Result Value Ref Range   Order Confirmation      ORDER PROCESSED BY  BLOOD BANK Performed at [facility], [place] [address] [place], [place] [address]   Prepare RBC     Status: None   Collection Time: 04/02/18 12:20 PM  Result Value Ref Range   Order Confirmation      ORDER PROCESSED BY BLOOD BANK Performed at [facility], [place] [address], [place], [place] [address]     Ct Abdomen Pelvis W Contrast  Result Date: 04/01/2018 CLINICAL DATA:  Unexplained anemia. Patient going to dialysis today. Recent endoscopic removal of duodenal mass. Rule out occult bleeding. EXAM: CT ABDOMEN AND PELVIS WITH CONTRAST TECHNIQUE: Multidetector CT imaging of  the abdomen and pelvis was performed using the standard protocol following bolus administration of intravenous contrast. CONTRAST:  147m OMNIPAQUE IOHEXOL 300 MG/ML  SOLN COMPARISON:  Feb 02, 2018 FINDINGS: Lower chest: The moderate left effusion is stable. Opacity in the left base and lingula is likely atelectasis. The 8 mm nodule in the left base is stable. A 7 mm nodule in the medial right base on series 4, image 12 is stable. No other abnormalities identified within the lung bases. Hepatobiliary: The portal vein is patent. Periportal edema is again identified. No focal liver mass is noted. The gallbladder is decompressed with a single stone near the neck. Mild prominence of the gallbladder wall could be due to poor distention. There is increased attenuation and a small amount of fluid adjacent to the gallbladder. However, the significance of this finding is unclear given ascites elsewhere. Pancreas: The pancreas is normal. Spleen: Normal in size without focal abnormality. Adrenals/Urinary Tract: Adrenal glands are normal. The kidneys are atrophic but stable. A probable cyst is seen in the left kidney, unchanged. No hydronephrosis. No ureteral stones. The bladder is decompressed but otherwise unremarkable. Stomach/Bowel: The distal esophagus and stomach are normal. The proximal third portion of the duodenum is mildly  prominent in caliber. By report, the previously identified polyp has been removed. Low-attenuation in the proximal third portion of the duodenum may represent ingested material. There is no evidence of obstruction. It would be difficult to evaluate for occult hemorrhage into the duodenum given recent follow-up removal. The remainder of the small bowel is normal. A metallic foreign body is seen in the sigmoid colon, likely ingested material of uncertain etiology. There is moderate fecal loading in the colon, decreased in the interval. Visualized appendix is normal. Vascular/Lymphatic: Dense atherosclerosis is seen in the nonaneurysmal aorta, iliac vessels, and femoral vessels. No adenopathy. Reproductive: Prostate is unremarkable. Other: There is a small amount of ascites in the abdomen, particularly adjacent to the inferior liver and gallbladder. No suspicious fluid collections. Increased attenuation in the subcutaneous fat is suspicious for volume overload. No free air. Musculoskeletal: Degenerative changes in the visualized spine. Increased sclerosis in the bones, likely due to renal failure. No acute abnormalities in the bones. IMPRESSION: 1. No cause for the patient's anemia is identified. 2. There is mild prominence the gallbladder wall and mild pericholecystic fluid. The significance of these findings is unclear given ascites elsewhere and signs of volume overload. There is a stone in the neck of the gallbladder which is stable. An ultrasound could better evaluate the gallbladder if there is concern. 3. Stable moderate effusion in the left base with atelectasis. 4. Stable pulmonary nodules in the lung bases. The 8 mm nodule on the left is the larger of the 2 nodules. These nodules are stable since November 2017. A final follow-up in 1 year could ensure greater than 2 years of stability. 5. Dense atherosclerosis in the aorta and branching vessels. 6. Metallic foreign body in the sigmoid colon, likely ingested  material. 7. Small ascites in the abdomen. 8. The proximal third portion of the duodenum is mildly prominent in caliber, of uncertain significance. No overt obstruction. Electronically Signed   By: DDorise BullionIII M.D   On: 04/01/2018 13:04    ROSnegative except above Blood pressure (!) 145/74, pulse 82, temperature 98.8 F (37.1 C), temperature source Oral, resp. rate 18, height '6\' 1"'  (1.854 m), weight 77.8 kg (171 lb 8.3 oz), SpO2 100 %. Physical Examvital signs stable afebrile no acute distress  patient lying comfortably in the bed exam pertinent for his abdomen being soft nontender labs reviewed hemoglobin in the 5 range increased BUN and creatinine  Assessment/Plan: Melena anemia in patient with recent procedure at [facility] which was reviewed and on aspirin and renal failure Plan: Clear liquid diet today and nothing by mouth after midnight the risks benefits methods of endoscopy tomorrow was discussed however we do sooner emergently if bleeding increases otherwise further workup and plans pending those findings Deneshia Zucker E 04/02/2018, 1:30 PM

## 2018-04-03 ENCOUNTER — Encounter (HOSPITAL_COMMUNITY): Payer: Self-pay | Admitting: Gastroenterology

## 2018-04-03 ENCOUNTER — Inpatient Hospital Stay (HOSPITAL_COMMUNITY): Payer: Medicare Other | Admitting: Certified Registered Nurse Anesthetist

## 2018-04-03 ENCOUNTER — Encounter (HOSPITAL_COMMUNITY)
Admission: EM | Disposition: A | Discharge status: Home / Self Care | Payer: Self-pay | Source: Home / Self Care | Attending: Internal Medicine

## 2018-04-03 HISTORY — PX: ESOPHAGOGASTRODUODENOSCOPY (EGD) WITH PROPOFOL: SHX5813

## 2018-04-03 LAB — BASIC METABOLIC PANEL
Anion gap: 13 (ref 5–15)
BUN: 138 mg/dL — ABNORMAL HIGH (ref 8–23)
CO2: 21 mmol/L — ABNORMAL LOW (ref 22–32)
CREATININE: 9.53 mg/dL — AB (ref 0.61–1.24)
Calcium: 8 mg/dL — ABNORMAL LOW (ref 8.9–10.3)
Chloride: 100 mmol/L (ref 98–111)
GFR calc Af Amer: 6 mL/min — ABNORMAL LOW (ref >60)
GFR, EST NON AFRICAN AMERICAN: 5 mL/min — AB (ref >60)
GLUCOSE: 92 mg/dL (ref 70–99)
POTASSIUM: 4.2 mmol/L (ref 3.5–5.1)
SODIUM: 134 mmol/L — AB (ref 135–145)

## 2018-04-03 LAB — CBC
HEMATOCRIT: 26.4 % — AB (ref 39.0–52.0)
Hemoglobin: 8.8 g/dL — ABNORMAL LOW (ref 13.0–17.0)
MCH: 29.3 pg (ref 26.0–34.0)
MCHC: 33.3 g/dL (ref 30.0–36.0)
MCV: 88 fL (ref 78.0–100.0)
PLATELETS: 134 10*3/uL — AB (ref 150–400)
RBC: 3 MIL/uL — ABNORMAL LOW (ref 4.22–5.81)
RDW: 15.8 % — ABNORMAL HIGH (ref 11.5–15.5)
WBC: 7.3 10*3/uL (ref 4.0–10.5)

## 2018-04-03 LAB — PREPARE RBC (CROSSMATCH)

## 2018-04-03 LAB — PHOSPHORUS: PHOSPHORUS: 1.7 mg/dL — AB (ref 2.5–4.6)

## 2018-04-03 LAB — HEMOGLOBIN AND HEMATOCRIT, BLOOD
HCT: 28.5 % — ABNORMAL LOW (ref 39.0–52.0)
Hemoglobin: 9.7 g/dL — ABNORMAL LOW (ref 13.0–17.0)

## 2018-04-03 SURGERY — ESOPHAGOGASTRODUODENOSCOPY (EGD) WITH PROPOFOL
Anesthesia: Monitor Anesthesia Care

## 2018-04-03 MED ORDER — CLONIDINE HCL 0.1 MG/24HR TD PTWK
0.1000 mg | MEDICATED_PATCH | TRANSDERMAL | Status: DC
  Administered 2018-04-03: 0.1 mg via TRANSDERMAL
  Filled 2018-04-03: qty 1

## 2018-04-03 MED ORDER — PROPOFOL 10 MG/ML IV BOLUS
INTRAVENOUS | Status: DC | PRN
  Administered 2018-04-03 (×3): 20 mg via INTRAVENOUS

## 2018-04-03 MED ORDER — PROPOFOL 500 MG/50ML IV EMUL
INTRAVENOUS | Status: DC | PRN
  Administered 2018-04-03: 100 ug/kg/min via INTRAVENOUS

## 2018-04-03 MED ORDER — CHLORHEXIDINE GLUCONATE CLOTH 2 % EX PADS: 6.0000 | MEDICATED_PAD | Freq: Every day | CUTANEOUS | Status: DC

## 2018-04-03 MED ORDER — SODIUM CHLORIDE 0.9 % IV SOLN
INTRAVENOUS | Status: DC | PRN
  Administered 2018-04-03: 10:00:00 via INTRAVENOUS

## 2018-04-03 MED ORDER — SODIUM CHLORIDE 0.9% IV SOLUTION
Freq: Once | INTRAVENOUS | Status: AC
  Administered 2018-04-03: 02:00:00 via INTRAVENOUS

## 2018-04-03 MED ORDER — SODIUM CHLORIDE 0.9 % IV SOLN: INTRAVENOUS | Status: DC

## 2018-04-03 SURGICAL SUPPLY — 14 items

## 2018-04-03 NOTE — Progress Notes (Signed)
Jeffrey [name] 10:12 AM  Subjective: Patient doing well without any further signs of bleeding and no other complaints and has not had a bowel movement since yesterday in case again discussed with his brother  Objective: Vital signs stable afebrile no acute distress exam please see preassessment assessment evaluation hemoglobin increased nicelyBUN and creatinine stable  Assessment: GI bleed probably secondary to duodenal polypectomy in a patient and renal failure and on aspirin  Plan: Okay to proceed with endoscopy and possibly use side-viewing scope with anesthesia assistance  [facility] E  Pager [phone] After 5PM or if no answer call [phone]

## 2018-04-03 NOTE — Progress Notes (Signed)
   Subjective:  Patient evaluated at bedside. He has no complaints today. He has not had a BM since yesterday. He continues to deny abdominal pain, nausea, vomiting, chest pain, shortness of breath, LE swelling. He is in agreement with EGD this AM.  Objective:  Vital signs in last 24 hours: Vitals:   04/03/18 0143 04/03/18 0210 04/03/18 0515 04/03/18 0811  BP: (!) 149/68 (!) 154/72 (!) 177/88 (!) 154/75  Pulse: 74 75 81 78  Resp: 18 18 20 18   Temp: 98.4 F (36.9 C) 98.5 F (36.9 C) 98.6 F (37 C) 98.1 F (36.7 C)  TempSrc: Oral Oral Oral Oral  SpO2: 100% 100% 100% 100%  Weight:      Height:       Constitutional: NAD, lying in bed comfortably CV: RRR, systolic murmur, no rubs or gallops Resp: CTAB, no increased work of breathing Abd: soft, NDNT, +BS Ext: no LE edema, warm  Assessment/Plan:  Active Problems:   Acute on chronic anemia   Acute anemia  Acute on chronic anemia 2/2 GI blood loss: FOBT positive and h/o 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 Patient is s/p 7 units pRBCs with Hgb this AM at 8.8. Melena seems to have slowed down; he continues to deny abdominal pain, nausea, vomiting. He continues to be hemodynamically stable; BUN up to 138 today. Dr. Watt Climes taking for EGD this morning. Based on findings and possible intervention, will see if he needs transfer to [facility] or if he can stay at [facility]. --protonix 40mg  IV BID --f/u EGD --f/u q8hr H/H  ESRD on home HD: Nephrology following and trying to follow MWF inpatient schedule. He does not appear volume overloaded on exam. Last HD 7/12, with next one planned for tomorrow. Electrolytes stable.  Recent NSTEMI 2/2 hypertensive emergency HTN: Holding aspirin. He continues to deny chest pain, shortness of breath, HAs, confusion. He is continued on hydralazine and coreg and still remaining hypertensive; will not escalate therapy at this time in setting of ongoing GI bleed, but will monitor  closely.  HIV: CD4 count in 10/2017 was 390. --continue Biktarvy  Dispo: Anticipated discharge in approximately 2-4 day(s).   Alphonzo Grieve, MD 04/03/2018, 9:55 AM IMTS - PGY3 Pager [phone]

## 2018-04-03 NOTE — Transfer of Care (Signed)
Immediate Anesthesia Transfer of Care Note  Patient: Jeffrey [name]  Procedure(s) Performed: ESOPHAGOGASTRODUODENOSCOPY (EGD) WITH PROPOFOL (N/A )  Patient Location: Endoscopy Unit  Anesthesia Type:MAC  Level of Consciousness: awake  Airway & Oxygen Therapy: Patient Spontanous Breathing and Patient connected to nasal cannula oxygen  Post-op Assessment: Report given to RN and Post -op Vital signs reviewed and stable  Post vital signs: Reviewed and stable  Last Vitals:  Vitals Value Taken Time  BP 159/71 04/03/2018 10:36 AM  Temp    Pulse 77 04/03/2018 10:36 AM  Resp 26 04/03/2018 10:36 AM  SpO2 100 % 04/03/2018 10:36 AM  Vitals shown include unvalidated device data.  Last Pain:  Vitals:   04/03/18 1003  TempSrc: Oral  PainSc: 0-No pain         Complications: No apparent anesthesia complications

## 2018-04-03 NOTE — Progress Notes (Signed)
Received call from [facility]. Patient to go to room 1101. Prior to patient transport, RN report should be provided to [phone], option 2. MD notified at [phone] to advise of room assignment. CM contacted to discuss transport process. Patient aware of bed assignment. Bartholomew Crews, RN

## 2018-04-03 NOTE — Anesthesia Preprocedure Evaluation (Signed)
Anesthesia Evaluation  Patient identified by MRN, date of birth, ID band Patient awake    Reviewed: Allergy & Precautions, NPO status , Patient's Chart, lab work & pertinent test results  Airway Mallampati: I  TM Distance: >3 FB Neck ROM: Full    Dental   Pulmonary COPD, former smoker,    Pulmonary exam normal        Cardiovascular hypertension, Pt. on medications Normal cardiovascular exam     Neuro/Psych Anxiety    GI/Hepatic (+) Hepatitis -, B  Endo/Other    Renal/GU Dialysis and ESRFRenal disease     Musculoskeletal   Abdominal   Peds  Hematology   Anesthesia Other Findings   Reproductive/Obstetrics                             Anesthesia Physical Anesthesia Plan  ASA: III  Anesthesia Plan: MAC   Post-op Pain Management:    Induction: Intravenous  PONV Risk Score and Plan: 1 and Treatment may vary due to age or medical condition  Airway Management Planned: Simple Face Mask  Additional Equipment:   Intra-op Plan:   Post-operative Plan:   Informed Consent: I have reviewed the patients History and Physical, chart, labs and discussed the procedure including the risks, benefits and alternatives for the proposed anesthesia with the patient or authorized representative who has indicated his/her understanding and acceptance.     Plan Discussed with: CRNA and Surgeon  Anesthesia Plan Comments:         Anesthesia Quick Evaluation

## 2018-04-03 NOTE — Anesthesia Postprocedure Evaluation (Signed)
Anesthesia Post Note  Patient: Jeffrey [name]  Procedure(s) Performed: ESOPHAGOGASTRODUODENOSCOPY (EGD) WITH PROPOFOL (N/A )     Patient location during evaluation: PACU Anesthesia Type: MAC Level of consciousness: awake and alert Pain management: pain level controlled Vital Signs Assessment: post-procedure vital signs reviewed and stable Respiratory status: spontaneous breathing, nonlabored ventilation, respiratory function stable and patient connected to nasal cannula oxygen Cardiovascular status: stable and blood pressure returned to baseline Postop Assessment: no apparent nausea or vomiting Anesthetic complications: no    Last Vitals:  Vitals:   04/03/18 1045 04/03/18 1108  BP: (!) 171/60 (!) 177/73  Pulse: 75 73  Resp: (!) 31 (!) 24  Temp:  36.9 C  SpO2: 100% 100%    Last Pain:  Vitals:   04/03/18 1108  TempSrc: Oral  PainSc:                  Jasai Sorg DAVID

## 2018-04-03 NOTE — Progress Notes (Signed)
Received call from [facility] logistics asking if the plan was for patient to transfer to [facility]. Advised that patient was scheduled for EGD this morning before patient was to be transferred. [facility] will coordinate bed and follow up when available. Bartholomew Crews, RN

## 2018-04-03 NOTE — Progress Notes (Signed)
[facility] Progress Note    Assessment/ Plan:   62 y.o.maleESRD HTN HIV with known duodenal mass who recently had ERCP attempt at removal at [facility] 6/24-03/21/2018. He had partial removal of the mass.  After d/c he developed symp anemia; h/o [place] with HD and was thus released from [place] and now on home HD with brother cannulating the button holes. His EDW is 76kg and last HD was Tuesday.    1. ESRD - Last HD Tuesday at home and terribly [place] with treatments which was why he was released from [place]. - last HD here at [facility] late Fri evening (7/12) ->  appreciate the brother helping w/ cannulation of the button holes which he does all the time at home. - Check phos and chemistry panel after the transfusions -> K is ok. - phos is 1.7 -> d/c phoslo  - Plan on HD tomorrow (Monday).   2. Duodenal mass w/ partial resection via ERCP - contemplating transfer to [facility] but getting EGD today. 3. HIV 4. Anemia - transfused multiple units with inadequate response due to likely ongoing blood loss. May need GI to scope again ([place] or [facility] to be determined); he was a complicated case and went to [facility] for ERCP partial resection of duodenal mass.    Subjective:   Continues to have dark black foul smelling stool. Denies dizziness or dyspnea or nausea.  Brother is bedside.   Objective:   BP (!) 154/75 (BP Location: Right Arm)   Pulse 78   Temp 98.1 F (36.7 C) (Oral)   Resp 18   Ht 6\' 1"  (1.854 m)   Wt 77.8 kg (171 lb 8.3 oz)   SpO2 100%   BMI 22.63 kg/m   Intake/Output Summary (Last 24 hours) at 04/03/2018 0913 Last data filed at 04/03/2018 0600 Gross per 24 hour  Intake 1036.23 ml  Output -  Net 1036.23 ml   Weight change:   Physical Exam: General appearance:NCAT Resp:clear to auscultation bilaterally Cardio:regular rate and rhythm, S1, S2 normal, no murmur, click, rub or gallop JX:BJYN, non-tender; bowel sounds normal; no masses, no  organomegaly Extremities:edematrace Pulses:2+ and symmetric Neurologic:Grossly normal Access: Lt Cimino good bruit and thrill    Imaging: Ct Abdomen Pelvis W Contrast  Result Date: 04/01/2018 CLINICAL DATA:  Unexplained anemia. Patient going to dialysis today. Recent endoscopic removal of duodenal mass. Rule out occult bleeding. EXAM: CT ABDOMEN AND PELVIS WITH CONTRAST TECHNIQUE: Multidetector CT imaging of the abdomen and pelvis was performed using the standard protocol following bolus administration of intravenous contrast. CONTRAST:  155mL OMNIPAQUE IOHEXOL 300 MG/ML  SOLN COMPARISON:  Feb 02, 2018 FINDINGS: Lower chest: The moderate left effusion is stable. Opacity in the left base and lingula is likely atelectasis. The 8 mm nodule in the left base is stable. A 7 mm nodule in the medial right base on series 4, image 12 is stable. No other abnormalities identified within the lung bases. Hepatobiliary: The portal vein is patent. Periportal edema is again identified. No focal liver mass is noted. The gallbladder is decompressed with a single stone near the neck. Mild prominence of the gallbladder wall could be due to poor distention. There is increased attenuation and a small amount of fluid adjacent to the gallbladder. However, the significance of this finding is unclear given ascites elsewhere. Pancreas: The pancreas is normal. Spleen: Normal in size without focal abnormality. Adrenals/Urinary Tract: Adrenal glands are normal. The kidneys are atrophic but stable. A probable cyst is seen in  the left kidney, unchanged. No hydronephrosis. No ureteral stones. The bladder is decompressed but otherwise unremarkable. Stomach/Bowel: The distal esophagus and stomach are normal. The proximal third portion of the duodenum is mildly prominent in caliber. By report, the previously identified polyp has been removed. Low-attenuation in the proximal third portion of the duodenum may represent ingested material.  There is no evidence of obstruction. It would be difficult to evaluate for occult hemorrhage into the duodenum given recent follow-up removal. The remainder of the small bowel is normal. A metallic foreign body is seen in the sigmoid colon, likely ingested material of uncertain etiology. There is moderate fecal loading in the colon, decreased in the interval. Visualized appendix is normal. Vascular/Lymphatic: Dense atherosclerosis is seen in the nonaneurysmal aorta, iliac vessels, and femoral vessels. No adenopathy. Reproductive: Prostate is unremarkable. Other: There is a small amount of ascites in the abdomen, particularly adjacent to the inferior liver and gallbladder. No suspicious fluid collections. Increased attenuation in the subcutaneous fat is suspicious for volume overload. No free air. Musculoskeletal: Degenerative changes in the visualized spine. Increased sclerosis in the bones, likely due to renal failure. No acute abnormalities in the bones. IMPRESSION: 1. No cause for the patient's anemia is identified. 2. There is mild prominence the gallbladder wall and mild pericholecystic fluid. The significance of these findings is unclear given ascites elsewhere and signs of volume overload. There is a stone in the neck of the gallbladder which is stable. An ultrasound could better evaluate the gallbladder if there is concern. 3. Stable moderate effusion in the left base with atelectasis. 4. Stable pulmonary nodules in the lung bases. The 8 mm nodule on the left is the larger of the 2 nodules. These nodules are stable since November 2017. A final follow-up in 1 year could ensure greater than 2 years of stability. 5. Dense atherosclerosis in the aorta and branching vessels. 6. Metallic foreign body in the sigmoid colon, likely ingested material. 7. Small ascites in the abdomen. 8. The proximal third portion of the duodenum is mildly prominent in caliber, of uncertain significance. No overt obstruction.  Electronically Signed   By: Dorise Bullion III M.D   On: 04/01/2018 13:04    Labs: BMET Recent Labs  Lab 03/31/18 0635 04/01/18 0800 04/03/18 0601  NA 134* 137 134*  K 3.7 4.3 4.2  CL 95* 101 100  CO2 24 25 21*  GLUCOSE 107* 103* 92  BUN 80* 137* 138*  CREATININE 8.69* 10.08* 9.53*  CALCIUM 8.9 8.0* 8.0*  PHOS  --  <1.0* 1.7*   CBC Recent Labs  Lab 03/31/18 0635  04/01/18 0518  04/02/18 0412 04/02/18 1426 04/02/18 2256 04/03/18 0601  WBC 8.2  --  8.8  --  6.6  --   --  7.3  HGB 5.4*   < > 5.6*   < > 5.8* 6.3* 7.0* 8.8*  HCT 17.8*   < > 16.8*   < > 17.0* 18.5* 20.3* 26.4*  MCV 101.1*  --  91.3  --  89.5  --   --  88.0  PLT 286  --  189  --  138*  --   --  134*   < > = values in this interval not displayed.    Medications:    . sodium chloride   Intravenous Once  . bictegravir-emtricitabine-tenofovir AF  1 tablet Oral Daily  . calcium acetate  1,334-2,001 mg Oral TID WC   And  . calcium acetate  667-1,334 mg Oral  With snacks  . carvedilol  25 mg Oral BID WC  . heparin  3,000 Units Dialysis Once in dialysis  . hydrALAZINE  75 mg Oral Q8H  . megestrol  80 mg Oral BID  . multivitamin  1 tablet Oral Daily  . pantoprazole (PROTONIX) IV  40 mg Intravenous Q12H  . traZODone  50 mg Oral QHS      Otelia Santee, MD 04/03/2018, 9:13 AM

## 2018-04-03 NOTE — Op Note (Signed)
[facility] Patient Name: Jeffrey [name] Procedure Date : 04/03/2018 MRN: [id number] Attending MD: Clarene Essex , MD Date of Birth: 1956/05/05 CSN: [id number] Age: 62 Admit Type: Inpatient Procedure:                Upper GI endoscopy Indications:              Melena Providers:                Clarene Essex, MD, Angus Seller, Laurena Spies,                            Technician, Clearnce Sorrel, CRNA Referring MD:              Medicines:                Propofol total dose 397 mg IV Complications:            No immediate complications. Estimated Blood Loss:     Estimated blood loss: none. Procedure:                Pre-Anesthesia Assessment:                           - Prior to the procedure, a History and Physical                            was performed, and patient medications and                            allergies were reviewed. The patient's tolerance of                            previous anesthesia was also reviewed. The risks                            and benefits of the procedure and the sedation                            options and risks were discussed with the patient.                            All questions were answered, and informed consent                            was obtained. Prior Anticoagulants: The patient has                            taken aspirin, last dose was 2 days prior to                            procedure. ASA Grade Assessment: III - A patient                            with severe systemic disease. After reviewing the  risks and benefits, the patient was deemed in                            satisfactory condition to undergo the procedure.                           After obtaining informed consent, the endoscope was                            passed under direct vision. Throughout the                            procedure, the patient's blood pressure, pulse, and                            oxygen saturations were  monitored continuously. The                            EG-[id number] ([id number]) scope was introduced through the                            mouth, and advanced to the second part of duodenum.                            The upper GI endoscopy was accomplished without                            difficulty. The patient tolerated the procedure                            well. Scope In: Scope Out: Findings:      The examined esophagus was normal.      The entire examined stomach was normal.      The ampulla, duodenal bulb, first portion of the duodenum, second       portion of the duodenum, major papilla and area of the papilla were       normal. possibly there was some residual polyp remaining since the       ampulla was slightly bulbous but since no signs of bleeding and fairly       good look at the ampulla we elected not to place the side-viewing scope       and no signs of pancreatic stent      The exam was otherwise without abnormality. Impression:               - Normal esophagus.                           - Normal stomach.                           - Normal ampulla, duodenal bulb, first portion of                            the duodenum, second portion of the duodenum, major  papilla and area of the papilla.                           - The examination was otherwise normal.                           - No specimens collected. Recommendation:           - Patient has a contact number available for                            emergencies. The signs and symptoms of potential                            delayed complications were discussed with the                            patient. Return to normal activities tomorrow.                            Written discharge instructions were provided to the                            patient.                           - Soft diet today. with his usual renal restrictions                           - Continue present medications.                            - No aspirin, ibuprofen, naproxen, or other                            non-steroidal anti-inflammatory drugs for 2 weeks                            after polyp removal.                           - Return to GI clinic PRN. hold further workup at                            this time since he had a recent colonoscopy and                            endoscopy. repeat polypectomy per Gaspar Cola will                            ask Brother what their future plans are however I                            believe they are still waiting to hear about the  biopsy                           - Telephone GI clinic if symptomatic PRN. Procedure Code(s):        --- Professional ---                           [phone], Esophagogastroduodenoscopy, flexible,                            transoral; diagnostic, including collection of                            specimen(s) by brushing or washing, when performed                            (separate procedure) Diagnosis Code(s):        --- Professional ---                           K92.1, Melena (includes Hematochezia) CPT copyright 2017 American Medical Association. All rights reserved. The codes documented in this report are preliminary and upon coder review may  be revised to meet current compliance requirements. Clarene Essex, MD 04/03/2018 10:41:33 AM This report has been signed electronically. Number of Addenda: 0

## 2018-04-04 ENCOUNTER — Telehealth: Payer: Self-pay | Admitting: Internal Medicine

## 2018-04-04 LAB — BPAM RBC
BLOOD PRODUCT EXPIRATION DATE: 201907162359
BLOOD PRODUCT EXPIRATION DATE: 201907302359
BLOOD PRODUCT EXPIRATION DATE: 201908112359
BLOOD PRODUCT EXPIRATION DATE: 201908112359
BLOOD PRODUCT EXPIRATION DATE: 201908152359
BLOOD PRODUCT EXPIRATION DATE: 201908152359
Blood Product Expiration Date: 201907162359
Blood Product Expiration Date: 201907302359
Blood Product Expiration Date: 201908042359
ISSUE DATE / TIME: 201907111042
ISSUE DATE / TIME: 201907111358
ISSUE DATE / TIME: 201907112359
ISSUE DATE / TIME: 201907121335
ISSUE DATE / TIME: 201907122332
ISSUE DATE / TIME: 201907130831
ISSUE DATE / TIME: 201907131744
ISSUE DATE / TIME: 201907140148
UNIT TYPE AND RH: 9500
UNIT TYPE AND RH: 9500
UNIT TYPE AND RH: 9500
Unit Type and Rh: 9500
Unit Type and Rh: 9500
Unit Type and Rh: 9500
Unit Type and Rh: 9500
Unit Type and Rh: 9500
Unit Type and Rh: 9500

## 2018-04-04 LAB — TYPE AND SCREEN
ABO/RH(D): O NEG
Antibody Screen: NEGATIVE
UNIT DIVISION: 0
UNIT DIVISION: 0
UNIT DIVISION: 0
UNIT DIVISION: 0
Unit division: 0
Unit division: 0
Unit division: 0
Unit division: 0
Unit division: 0

## 2018-04-04 LAB — CBC
HCT: 25.7 % — ABNORMAL LOW (ref 39.0–52.0)
HEMOGLOBIN: 8.7 g/dL — AB (ref 13.0–17.0)
MCH: 29.6 pg (ref 26.0–34.0)
MCHC: 33.9 g/dL (ref 30.0–36.0)
MCV: 87.4 fL (ref 78.0–100.0)
Platelets: 120 10*3/uL — ABNORMAL LOW (ref 150–400)
RBC: 2.94 MIL/uL — AB (ref 4.22–5.81)
RDW: 16.1 % — ABNORMAL HIGH (ref 11.5–15.5)
WBC: 6.5 10*3/uL (ref 4.0–10.5)

## 2018-04-04 LAB — RENAL FUNCTION PANEL
ALBUMIN: 2.2 g/dL — AB (ref 3.5–5.0)
Anion gap: 15 (ref 5–15)
BUN: 146 mg/dL — AB (ref 8–23)
CALCIUM: 8.1 mg/dL — AB (ref 8.9–10.3)
CO2: 20 mmol/L — AB (ref 22–32)
Chloride: 100 mmol/L (ref 98–111)
Creatinine, Ser: 11.32 mg/dL — ABNORMAL HIGH (ref 0.61–1.24)
GFR calc Af Amer: 5 mL/min — ABNORMAL LOW (ref >60)
GFR calc non Af Amer: 4 mL/min — ABNORMAL LOW (ref >60)
GLUCOSE: 95 mg/dL (ref 70–99)
PHOSPHORUS: 1.7 mg/dL — AB (ref 2.5–4.6)
Potassium: 4.1 mmol/L (ref 3.5–5.1)
SODIUM: 135 mmol/L (ref 135–145)

## 2018-04-04 LAB — HEPATIC FUNCTION PANEL
ALT: 8 U/L (ref 0–44)
AST: 14 U/L — ABNORMAL LOW (ref 15–41)
Albumin: 2.2 g/dL — ABNORMAL LOW (ref 3.5–5.0)
Alkaline Phosphatase: 59 U/L (ref 38–126)
BILIRUBIN TOTAL: 0.5 mg/dL (ref 0.3–1.2)
Total Protein: 4.8 g/dL — ABNORMAL LOW (ref 6.5–8.1)

## 2018-04-04 LAB — PATHOLOGIST SMEAR REVIEW

## 2018-04-04 MED ORDER — CARVEDILOL 25 MG PO TABS: 25.0000 mg | ORAL_TABLET | Freq: Two times a day (BID) | ORAL | 5 refills | Status: DC

## 2018-04-04 NOTE — Progress Notes (Signed)
Brief note, full progress to follow. Patient with stable hemoglobin and no further melena. HD stable. Will discharge home today with [facility] follow up this Friday. Holding asa until GI follow up Aug 1st.   He will do home dialysis today and does not need inpatient HD today.   Alphonzo Grieve, MD IMTS - PGY3 Pager [phone]

## 2018-04-04 NOTE — Progress Notes (Signed)
  Date: 04/04/2018  Patient name: Jeffrey [name]  Medical record number: [id number]  Date of birth: 1955-10-18   I have seen and evaluated this patient and I have discussed the plan of care with the house staff. Please see their note for complete details. I concur with their findings with the following additions/corrections: Stable for D/C home and [facility] F/U one week for HgB check.  Bartholomew Crews, MD 04/04/2018, 4:01 PM

## 2018-04-04 NOTE — Progress Notes (Signed)
Jeffrey [name] to be D/C'd Home per MD order.  Discussed prescriptions and follow up appointments with the patient. Prescriptions given to patient, medication list explained in detail. Pt verbalized understanding.  Allergies as of 04/04/2018      Reactions   Lisinopril Anaphylaxis, Shortness Of Breath   Throat swelling   Penicillins Anaphylaxis, Other (See Comments)   Childhood allergy Has patient had a PCN reaction causing immediate rash, facial/tongue/throat swelling, SOB or lightheadedness with hypotension: Yes Has patient had a PCN reaction causing severe rash involving mucus membranes or skin necrosis: Unk Has patient had a PCN reaction that required hospitalization: Unk Has patient had a PCN reaction occurring within the last 10 years: No If all of the above answers are "NO", then may proceed with Cephalosporin use.      Medication List    STOP taking these medications   amLODipine 10 MG tablet Commonly known as:  NORVASC   aspirin 81 MG chewable tablet   doxazosin 4 MG tablet Commonly known as:  CARDURA   morphine 15 MG tablet Commonly known as:  MSIR     TAKE these medications   albuterol (2.5 MG/3ML) 0.083% nebulizer solution Commonly known as:  PROVENTIL Take 2.5 mg by nebulization every 6 (six) hours as needed for wheezing or shortness of breath.   ALPRAZolam 0.25 MG tablet Commonly known as:  XANAX Take 0.25 mg by mouth 3 (three) times daily as needed for anxiety.   bictegravir-emtricitabine-tenofovir AF 50-200-25 MG Tabs tablet Commonly known as:  BIKTARVY Take 1 tablet by mouth daily.   calcium acetate 667 MG capsule Commonly known as:  PHOSLO Take 667-2,001 mg by mouth See admin instructions. Take 1,334-2,001 mg three times a day with each meal and 667-1,334 mg with each snack   carvedilol 25 MG tablet Commonly known as:  COREG Take 1 tablet (25 mg total) by mouth 2 (two) times daily with a meal.   cloNIDine 0.1 mg/24hr patch Commonly known as:   CATAPRES - Dosed in mg/24 hr UNWRAP AND APPLY 1 PATCH TO SKIN EVERY 7 DAYS   clotrimazole-betamethasone cream Commonly known as:  LOTRISONE Apply 1 application topically 2 (two) times daily. What changed:    when to take this  additional instructions   COMBIVENT IN Inhale 2 puffs into the lungs every 6 (six) hours as needed (as needed for shortness of breath).   epoetin alfa 2000 UNIT/ML injection Commonly known as:  EPOGEN,PROCRIT Inject 2,000 Units into the vein 3 (three) times a week.   ethyl chloride spray Apply 1 application topically daily as needed (HD- to numb).   heparin 1000 UNIT/ML injection Inject 3,000 Units into the vein 4 (four) times a week.   hydrALAZINE 25 MG tablet Commonly known as:  APRESOLINE TAKE 3 TABLETS(75 MG) BY MOUTH EVERY 8 HOURS   iron sucrose 20 MG/ML injection Commonly known as:  VENOFER Inject 100 mg into the vein every Monday.   megestrol 40 MG tablet Commonly known as:  MEGACE Take 2 tablets (80 mg total) by mouth 2 (two) times daily.   multivitamin Tabs tablet Take 1 tablet by mouth daily.   omeprazole 20 MG capsule Commonly known as:  PRILOSEC Take 1 capsule (20 mg total) by mouth daily.   traZODone 50 MG tablet Commonly known as:  DESYREL TAKE 1 TABLET(50 MG) BY MOUTH AT BEDTIME AS NEEDED FOR SLEEP   VOLTAREN 1 % Gel Generic drug:  diclofenac sodium APPLY 2 GRAMS EXTERNALLY TO THE AFFECTED AREA  FOUR TIMES DAILY What changed:    how much to take  how to take this  when to take this  reasons to take this  additional instructions       Vitals:   04/04/18 0524 04/04/18 0957  BP: 108/77 (!) 160/69  Pulse: 76 80  Resp: 18 20  Temp: 98.2 F (36.8 C) 98.6 F (37 C)  SpO2: 100% 100%    Skin clean, dry and intact without evidence of skin break down, no evidence of skin tears noted. IV catheter discontinued intact. Site without signs and symptoms of complications. Dressing and pressure applied. Pt denies pain at  this time. No complaints noted.  An After Visit Summary was printed and given to the patient. Patient escorted via [place], and D/C home via private auto.  Dixie Dials RN, BSN

## 2018-04-04 NOTE — Telephone Encounter (Signed)
Hospital f/u per Dr Jari Favre; pt appt 1045a 07/19/NW

## 2018-04-04 NOTE — Care Management Important Message (Signed)
Important Message  Patient Details  Name: Jeffrey [name] MRN: [id number] Date of Birth: 24-May-1956   Medicare Important Message Given:  Yes    [name] 04/04/2018, 3:25 PM

## 2018-04-04 NOTE — Progress Notes (Signed)
   Subjective: Mr. Livsey was seen and evaluated at bedside this morning. He denies any complaints today. Has not had a BM for 2 days. We went over his EGD that showed no signs of active bleeding. Since his blood counts have remained stable and he is asymptomatic, he will be discharged home with follow-up appointment in [facility] for CBC re-check, as well as follow-up with GI as scheduled at [facility]. No headaches, chest pain, palpitations, shortness of breath, n/v, abdominal pain, urinary symptoms, hematochezia, melena, diarrhea, constipation, or lower extremity swelling.  Objective:  Vital signs in last 24 hours: Vitals:   04/03/18 1653 04/03/18 2056 04/04/18 0524 04/04/18 0957  BP: (!) 160/81 (!) 164/86 108/77 (!) 160/69  Pulse: 81 84 76 80  Resp: 19 20 18 20   Temp: 98.8 F (37.1 C) 98.7 F (37.1 C) 98.2 F (36.8 C) 98.6 F (37 C)  TempSrc: Oral Oral Oral Oral  SpO2: 98% 100% 100% 100%  Weight:      Height:       Physical exam: General: awake, alert male lying in bed in NAD Cardiovascular: RRR, SEM at upper sternal borders; no rubs or gallops.  Respiratory: no increased work of breathing; lungs are CTA bilaterally.  Abdominal: BS+, abdomen is soft, non-tender, non-distended Ext: no lower extremity edema  Assessment/Plan:  Active Problems:   Acute on chronic anemia   Acute anemia  Acute on chronic anemia 2/2 GI blood loss: FOBT positive and h/o recent ERCP with duodenal mass excision involving ampulla on 7/1 at [facility]. Patient is s/p 7 units pRBCs. Hgb has remained stable since yesterday; Hgb 8.7 this morning. Denies any further episodes of melena. He is asymptomatic and has remained hemodynamically stable. EGD yesterday shows no evidence of active upper GI bleed. Will plan to discharge his home today with follow-up in [facility] this week for repeat CBC and planned GI follow-up with [facility].  - ESRD on home HD: He does not appear volume overloaded on exam. Last HD 7/12. Electrolytes stable.  Will do his HD at home upon discharge.   Recent NSTEMI 2/2 hypertensive emergency HTN: Holding aspirin until follow-up GI appointment on 8/1. Denies chest pain, shortness of breath, HAs, confusion. Blood pressures have remained higher this admission, other than  108/77 this morning. Will hold off on escalation of therapy for now given recent GI bleed; continue on hydralazine and coreg upon discharge with further outpatient management as needed.   HIV: CD4 count in 10/2017 was 390. --continue Biktarvy    Dispo: Anticipated discharge home today.   Modena Nunnery D, DO 04/04/2018, 11:28 AM Pager: [phone]

## 2018-04-05 ENCOUNTER — Ambulatory Visit: Payer: Medicare Other

## 2018-04-05 DIAGNOSIS — N2581 Secondary hyperparathyroidism of renal origin: Secondary | ICD-10-CM | POA: Diagnosis not present

## 2018-04-05 DIAGNOSIS — D631 Anemia in chronic kidney disease: Secondary | ICD-10-CM | POA: Diagnosis not present

## 2018-04-05 DIAGNOSIS — D509 Iron deficiency anemia, unspecified: Secondary | ICD-10-CM | POA: Diagnosis not present

## 2018-04-05 DIAGNOSIS — N186 End stage renal disease: Secondary | ICD-10-CM | POA: Diagnosis not present

## 2018-04-05 DIAGNOSIS — Z992 Dependence on renal dialysis: Secondary | ICD-10-CM | POA: Diagnosis not present

## 2018-04-07 DIAGNOSIS — N186 End stage renal disease: Secondary | ICD-10-CM | POA: Diagnosis not present

## 2018-04-07 DIAGNOSIS — D509 Iron deficiency anemia, unspecified: Secondary | ICD-10-CM | POA: Diagnosis not present

## 2018-04-07 DIAGNOSIS — Z992 Dependence on renal dialysis: Secondary | ICD-10-CM | POA: Diagnosis not present

## 2018-04-07 DIAGNOSIS — N2581 Secondary hyperparathyroidism of renal origin: Secondary | ICD-10-CM | POA: Diagnosis not present

## 2018-04-07 DIAGNOSIS — D631 Anemia in chronic kidney disease: Secondary | ICD-10-CM | POA: Diagnosis not present

## 2018-04-08 ENCOUNTER — Telehealth: Payer: Self-pay | Admitting: Internal Medicine

## 2018-04-08 ENCOUNTER — Encounter: Payer: Self-pay | Admitting: Internal Medicine

## 2018-04-08 ENCOUNTER — Ambulatory Visit (INDEPENDENT_AMBULATORY_CARE_PROVIDER_SITE_OTHER): Payer: Medicare Other | Admitting: Internal Medicine

## 2018-04-08 VITALS — BP 135/77 | HR 75 | Temp 98.9°F

## 2018-04-08 DIAGNOSIS — Z21 Asymptomatic human immunodeficiency virus [HIV] infection status: Secondary | ICD-10-CM

## 2018-04-08 DIAGNOSIS — Z79899 Other long term (current) drug therapy: Secondary | ICD-10-CM | POA: Diagnosis not present

## 2018-04-08 DIAGNOSIS — D638 Anemia in other chronic diseases classified elsewhere: Secondary | ICD-10-CM

## 2018-04-08 DIAGNOSIS — Z9889 Other specified postprocedural states: Secondary | ICD-10-CM

## 2018-04-08 DIAGNOSIS — Z87891 Personal history of nicotine dependence: Secondary | ICD-10-CM

## 2018-04-08 DIAGNOSIS — J45909 Unspecified asthma, uncomplicated: Secondary | ICD-10-CM

## 2018-04-08 DIAGNOSIS — I120 Hypertensive chronic kidney disease with stage 5 chronic kidney disease or end stage renal disease: Secondary | ICD-10-CM

## 2018-04-08 DIAGNOSIS — D509 Iron deficiency anemia, unspecified: Secondary | ICD-10-CM | POA: Diagnosis not present

## 2018-04-08 DIAGNOSIS — D631 Anemia in chronic kidney disease: Secondary | ICD-10-CM | POA: Diagnosis not present

## 2018-04-08 DIAGNOSIS — R0683 Snoring: Secondary | ICD-10-CM | POA: Insufficient documentation

## 2018-04-08 DIAGNOSIS — B1910 Unspecified viral hepatitis B without hepatic coma: Secondary | ICD-10-CM

## 2018-04-08 DIAGNOSIS — N2581 Secondary hyperparathyroidism of renal origin: Secondary | ICD-10-CM | POA: Diagnosis not present

## 2018-04-08 DIAGNOSIS — Z8719 Personal history of other diseases of the digestive system: Secondary | ICD-10-CM

## 2018-04-08 DIAGNOSIS — Z992 Dependence on renal dialysis: Secondary | ICD-10-CM

## 2018-04-08 DIAGNOSIS — K922 Gastrointestinal hemorrhage, unspecified: Secondary | ICD-10-CM

## 2018-04-08 DIAGNOSIS — N186 End stage renal disease: Secondary | ICD-10-CM | POA: Diagnosis not present

## 2018-04-08 DIAGNOSIS — Z09 Encounter for follow-up examination after completed treatment for conditions other than malignant neoplasm: Secondary | ICD-10-CM | POA: Diagnosis not present

## 2018-04-08 LAB — CBC
HCT: 27.6 % — ABNORMAL LOW (ref 39.0–52.0)
Hemoglobin: 9.5 g/dL — ABNORMAL LOW (ref 13.0–17.0)
MCH: 30.3 pg (ref 26.0–34.0)
MCHC: 34.4 g/dL (ref 30.0–36.0)
MCV: 87.9 fL (ref 78.0–100.0)
PLATELETS: 152 10*3/uL (ref 150–400)
RBC: 3.14 MIL/uL — ABNORMAL LOW (ref 4.22–5.81)
RDW: 16.6 % — AB (ref 11.5–15.5)
WBC: 8.9 10*3/uL (ref 4.0–10.5)

## 2018-04-08 LAB — BASIC METABOLIC PANEL
Anion gap: 14 (ref 5–15)
BUN: 103 mg/dL — AB (ref 8–23)
CALCIUM: 8.7 mg/dL — AB (ref 8.9–10.3)
CHLORIDE: 101 mmol/L (ref 98–111)
CO2: 21 mmol/L — ABNORMAL LOW (ref 22–32)
CREATININE: 12.13 mg/dL — AB (ref 0.61–1.24)
GFR calc Af Amer: 4 mL/min — ABNORMAL LOW (ref >60)
GFR, EST NON AFRICAN AMERICAN: 4 mL/min — AB (ref >60)
Glucose, Bld: 103 mg/dL — ABNORMAL HIGH (ref 70–99)
Potassium: 3.7 mmol/L (ref 3.5–5.1)
SODIUM: 136 mmol/L (ref 135–145)

## 2018-04-08 NOTE — Telephone Encounter (Signed)
Patient completed HFU 04/08/2018. Hubbard Hartshorn, RN, BSN

## 2018-04-08 NOTE — Assessment & Plan Note (Addendum)
-   Most recent hospitalization 04/03/18 due to upper GI bleed after ESRP - Hemoglobin dropped to 5.4 from baseline 8-9. Required 8 units of blood - Since discharge, patient denies any further episodes of melena or BRBPR - Has an upcoming appointment with GI for repeat scope  - Upper GI bleed appears to have resolved - Will get CBC today to re-check hgb

## 2018-04-08 NOTE — Assessment & Plan Note (Addendum)
-   Patient with recent hospitalization for GI blood as a complication of recent ESRP - Had hgb drop down to 5.4 had to receive 8 units of blood during admission - Last hemoglobin on discharge 8.7 - History of chronic anemia with baseline hemoglobin 8-9 associated with renal failure  - Currently on EPO and Iron supplements - Patient denies any feelings of fatigue, palpitations, shortness of breath  - Acute anemia due to GI bleed most likely to have resolved, no longer symptomatic - Will re-check CBC today

## 2018-04-08 NOTE — Progress Notes (Signed)
CC: Follow up after hospitalization for GI bleed  HPI: Jeffrey [name] is a 62 y.o. M w/ PMH of ESRD on home dialysis 4x a week, Asthma, Hep B, HIV, HTN presenting to the clinic after discharge from a recent hospitalization for GI bleed. In the hospital, he had his hgb drop to 5.4 and had to be given 8 units of blood but was discharge when his hgb was stabilized around 8. Since discharge, he states he have not had any new episodes of melena or BRBPR. States his stool is semi-formed and brown. He also states he has improved appetite and is beginning to gain weight. Denies any episodes of fever, nausea, vomiting, diarrhea or constipation. Denies any fatigue, palpitation, shortness of breath.  He is also accompanied by his brother who asks about patient's night time symptoms. He states pt often has gasping breaths as he falls asleep. The patient denies that these wakes him up at night and denies any daytime sleepiness or sudden loss of consciousness. Patient's brother inquires about whether patient should have oxygen at home. Patient is a former smoker with about 10 pack year history of smoking but he does not currently smoke.  Past Medical History:  Diagnosis Date  . Anemia   . Anxiety   . Arthritis   . Asthma    per pt hx  . End stage renal disease on home HD 07/10/2011   Started HD in September 2012 at [facility] with a tunneled HD catheter, now on home HD with NxtStage. Dialyzing through AVF L lower arm with buttonhole technique as of mid 2014. His brother does the HD treatments at home.  They are roommates for 23 years.  The brother works 3rd shift and gets off about 8am and then puts Jeffrey [name] on HD in the morning after getting home. Most of the time he does HD about 4 times a week, for about 4 hours per treatment. Cause of ESRD was HTN according to patient. He says he let his health go and ending up with complications, and that he didn't like seeing doctors in those days.  He says he was diagnosed  with severe HTN when he lived in [place] in his 71's.   . Hepatitis B carrier ([place])   . HIV infection ([place])   . Hypertension   . Hyperthyroidism    normal now  . Pneumonia several yrs ago  . Seizure ([place])   . Seizures ([place]) 06/02/2011   x 1 none since  . Thrombocytopenia ([place])    Review of Systems: Review of Systems  Constitutional: Negative for chills, fever and malaise/fatigue.       Weight gain  Gastrointestinal: Negative for constipation, diarrhea, nausea and vomiting.    Physical Exam: Vitals:   04/08/18 1051  BP: 135/77  Pulse: 75  Temp: 98.9 F (37.2 C)  TempSrc: Oral  SpO2: 99%   Physical Exam  Constitutional: He is oriented to person, place, and time. He appears well-developed and well-nourished. No distress.  Sitting in wheelchair  Neck: Normal range of motion. Neck supple. No JVD present. No tracheal deviation present.  Cardiovascular: Normal rate, regular rhythm, normal heart sounds and intact distal pulses.  Respiratory: Effort normal. No stridor. No respiratory distress.  Faint crackles on bilateral lower lobes  GI: Soft. Bowel sounds are normal. He exhibits distension. There is no tenderness. There is no guarding.  Lymphadenopathy:    He has no cervical adenopathy.  Neurological: He is alert and oriented to  person, place, and time. He has normal reflexes. No cranial nerve deficit.  Psychiatric: He has a normal mood and affect. His behavior is normal. Judgment and thought content normal.    Assessment & Plan:   See Encounters Tab for problem based charting.  Patient seen with Dr. Daryll Drown  -Gilberto Better, PGY1

## 2018-04-08 NOTE — Telephone Encounter (Signed)
Called pt's brother after the patient expressed that he would like to know the result of his CBC. Told him that it was 9.5 and that it is improving. Patient's brother express understanding and states he will let the patient know

## 2018-04-08 NOTE — Addendum Note (Signed)
Addended by: Mosetta Anis on: 04/08/2018 06:01 PM   Modules accepted: Orders

## 2018-04-08 NOTE — Patient Instructions (Signed)
Hello Jeffrey [name]  We are glad to see that you are well. You complained about history of night time gasping and snoring. We are ordering a sleep study to see if you have sleep apnea. We also are checking your blood labs to see if your blood level is continuing to be at a healthy level. Thank you for visiting the clinic.  -Gilberto Better

## 2018-04-08 NOTE — Assessment & Plan Note (Signed)
-   Pt is present with brother who states patient has loud snoring at night and often starts to gasp as he falls asleep - Patient denies any daytime sleepiness or fatigue, but does state he slept very well whenever he was on [place] O2 during his recent hospitalization - Patient asking if he can get home oxygen - Informed pt that we should try to diagnose the problem first  - Patient with night time gasping 2/2 OSA vs Normal snoring - Patient scores 5 points on STOP-BANG: High risk for OSA - Will put in referral for sleep study with preference for home sleep study

## 2018-04-10 ENCOUNTER — Other Ambulatory Visit: Payer: Self-pay

## 2018-04-10 ENCOUNTER — Emergency Department (HOSPITAL_COMMUNITY): Payer: Medicare Other

## 2018-04-10 ENCOUNTER — Inpatient Hospital Stay (HOSPITAL_COMMUNITY)
Admission: EM | Admit: 2018-04-10 | Discharge: 2018-04-15 | DRG: 640 | Disposition: A | Discharge status: Home / Self Care | Payer: Medicare Other | Attending: Internal Medicine | Admitting: Internal Medicine

## 2018-04-10 ENCOUNTER — Encounter (HOSPITAL_COMMUNITY): Payer: Self-pay | Admitting: Emergency Medicine

## 2018-04-10 ENCOUNTER — Other Ambulatory Visit (HOSPITAL_COMMUNITY): Payer: Self-pay

## 2018-04-10 DIAGNOSIS — R0602 Shortness of breath: Secondary | ICD-10-CM | POA: Diagnosis not present

## 2018-04-10 DIAGNOSIS — B181 Chronic viral hepatitis B without delta-agent: Secondary | ICD-10-CM | POA: Diagnosis present

## 2018-04-10 DIAGNOSIS — Z87891 Personal history of nicotine dependence: Secondary | ICD-10-CM

## 2018-04-10 DIAGNOSIS — D631 Anemia in chronic kidney disease: Secondary | ICD-10-CM | POA: Diagnosis present

## 2018-04-10 DIAGNOSIS — Z992 Dependence on renal dialysis: Secondary | ICD-10-CM

## 2018-04-10 DIAGNOSIS — J449 Chronic obstructive pulmonary disease, unspecified: Secondary | ICD-10-CM | POA: Diagnosis present

## 2018-04-10 DIAGNOSIS — Z809 Family history of malignant neoplasm, unspecified: Secondary | ICD-10-CM

## 2018-04-10 DIAGNOSIS — I509 Heart failure, unspecified: Secondary | ICD-10-CM | POA: Diagnosis not present

## 2018-04-10 DIAGNOSIS — Z833 Family history of diabetes mellitus: Secondary | ICD-10-CM

## 2018-04-10 DIAGNOSIS — I351 Nonrheumatic aortic (valve) insufficiency: Secondary | ICD-10-CM | POA: Diagnosis present

## 2018-04-10 DIAGNOSIS — R0600 Dyspnea, unspecified: Secondary | ICD-10-CM | POA: Diagnosis present

## 2018-04-10 DIAGNOSIS — E8770 Fluid overload, unspecified: Principal | ICD-10-CM | POA: Diagnosis present

## 2018-04-10 DIAGNOSIS — T82590A Other mechanical complication of surgically created arteriovenous fistula, initial encounter: Secondary | ICD-10-CM

## 2018-04-10 DIAGNOSIS — Z888 Allergy status to other drugs, medicaments and biological substances status: Secondary | ICD-10-CM | POA: Diagnosis not present

## 2018-04-10 DIAGNOSIS — G40409 Other generalized epilepsy and epileptic syndromes, not intractable, without status epilepticus: Secondary | ICD-10-CM | POA: Diagnosis present

## 2018-04-10 DIAGNOSIS — Z21 Asymptomatic human immunodeficiency virus [HIV] infection status: Secondary | ICD-10-CM | POA: Diagnosis present

## 2018-04-10 DIAGNOSIS — J90 Pleural effusion, not elsewhere classified: Secondary | ICD-10-CM | POA: Diagnosis not present

## 2018-04-10 DIAGNOSIS — Z79899 Other long term (current) drug therapy: Secondary | ICD-10-CM | POA: Diagnosis not present

## 2018-04-10 DIAGNOSIS — I120 Hypertensive chronic kidney disease with stage 5 chronic kidney disease or end stage renal disease: Secondary | ICD-10-CM | POA: Diagnosis present

## 2018-04-10 DIAGNOSIS — I132 Hypertensive heart and chronic kidney disease with heart failure and with stage 5 chronic kidney disease, or end stage renal disease: Secondary | ICD-10-CM | POA: Diagnosis not present

## 2018-04-10 DIAGNOSIS — I252 Old myocardial infarction: Secondary | ICD-10-CM | POA: Diagnosis not present

## 2018-04-10 DIAGNOSIS — Z8249 Family history of ischemic heart disease and other diseases of the circulatory system: Secondary | ICD-10-CM | POA: Diagnosis not present

## 2018-04-10 DIAGNOSIS — I160 Hypertensive urgency: Secondary | ICD-10-CM | POA: Diagnosis present

## 2018-04-10 DIAGNOSIS — I440 Atrioventricular block, first degree: Secondary | ICD-10-CM | POA: Diagnosis not present

## 2018-04-10 DIAGNOSIS — R531 Weakness: Secondary | ICD-10-CM | POA: Diagnosis not present

## 2018-04-10 DIAGNOSIS — R011 Cardiac murmur, unspecified: Secondary | ICD-10-CM | POA: Diagnosis not present

## 2018-04-10 DIAGNOSIS — J45909 Unspecified asthma, uncomplicated: Secondary | ICD-10-CM | POA: Diagnosis present

## 2018-04-10 DIAGNOSIS — N186 End stage renal disease: Secondary | ICD-10-CM | POA: Diagnosis present

## 2018-04-10 DIAGNOSIS — Z880 Allergy status to penicillin: Secondary | ICD-10-CM

## 2018-04-10 DIAGNOSIS — M1990 Unspecified osteoarthritis, unspecified site: Secondary | ICD-10-CM | POA: Diagnosis present

## 2018-04-10 DIAGNOSIS — R0603 Acute respiratory distress: Secondary | ICD-10-CM

## 2018-04-10 DIAGNOSIS — R569 Unspecified convulsions: Secondary | ICD-10-CM

## 2018-04-10 LAB — BASIC METABOLIC PANEL
ANION GAP: 14 (ref 5–15)
BUN: 77 mg/dL — ABNORMAL HIGH (ref 8–23)
CHLORIDE: 99 mmol/L (ref 98–111)
CO2: 22 mmol/L (ref 22–32)
Calcium: 8.6 mg/dL — ABNORMAL LOW (ref 8.9–10.3)
Creatinine, Ser: 10.83 mg/dL — ABNORMAL HIGH (ref 0.61–1.24)
GFR calc non Af Amer: 4 mL/min — ABNORMAL LOW (ref >60)
GFR, EST AFRICAN AMERICAN: 5 mL/min — AB (ref >60)
Glucose, Bld: 101 mg/dL — ABNORMAL HIGH (ref 70–99)
POTASSIUM: 3.1 mmol/L — AB (ref 3.5–5.1)
SODIUM: 135 mmol/L (ref 135–145)

## 2018-04-10 LAB — CBC
HEMATOCRIT: 28.7 % — AB (ref 39.0–52.0)
Hemoglobin: 9.7 g/dL — ABNORMAL LOW (ref 13.0–17.0)
MCH: 30.5 pg (ref 26.0–34.0)
MCHC: 33.8 g/dL (ref 30.0–36.0)
MCV: 90.3 fL (ref 78.0–100.0)
PLATELETS: 202 10*3/uL (ref 150–400)
RBC: 3.18 MIL/uL — ABNORMAL LOW (ref 4.22–5.81)
RDW: 17.2 % — AB (ref 11.5–15.5)
WBC: 8.8 10*3/uL (ref 4.0–10.5)

## 2018-04-10 LAB — BRAIN NATRIURETIC PEPTIDE: B NATRIURETIC PEPTIDE 5: 2229.6 pg/mL — AB (ref 0.0–100.0)

## 2018-04-10 LAB — I-STAT TROPONIN, ED: Troponin i, poc: 0.07 ng/mL (ref 0.00–0.08)

## 2018-04-10 MED ORDER — ALBUTEROL SULFATE (2.5 MG/3ML) 0.083% IN NEBU
2.5000 mg | INHALATION_SOLUTION | Freq: Four times a day (QID) | RESPIRATORY_TRACT | Status: DC | PRN
  Administered 2018-04-10 – 2018-04-13 (×2): 2.5 mg via RESPIRATORY_TRACT
  Filled 2018-04-10 (×2): qty 3

## 2018-04-10 MED ORDER — CLONIDINE HCL 0.1 MG/24HR TD PTWK
0.1000 mg | MEDICATED_PATCH | TRANSDERMAL | Status: DC
  Administered 2018-04-10: 0.1 mg via TRANSDERMAL
  Filled 2018-04-10: qty 1

## 2018-04-10 MED ORDER — ACETAMINOPHEN 650 MG RE SUPP
650.0000 mg | Freq: Four times a day (QID) | RECTAL | Status: DC | PRN
  Administered 2018-04-11: 650 mg via RECTAL
  Filled 2018-04-10: qty 1

## 2018-04-10 MED ORDER — RENA-VITE PO TABS
1.0000 | ORAL_TABLET | Freq: Every day | ORAL | Status: DC
  Administered 2018-04-11: 1 via ORAL
  Filled 2018-04-10 (×2): qty 1

## 2018-04-10 MED ORDER — CARVEDILOL 25 MG PO TABS
25.0000 mg | ORAL_TABLET | Freq: Two times a day (BID) | ORAL | Status: DC
  Administered 2018-04-10 – 2018-04-15 (×9): 25 mg via ORAL
  Filled 2018-04-10 (×9): qty 1

## 2018-04-10 MED ORDER — CALCIUM ACETATE (PHOS BINDER) 667 MG PO CAPS
667.0000 mg | ORAL_CAPSULE | ORAL | Status: DC | PRN
  Administered 2018-04-14: 667 mg via ORAL

## 2018-04-10 MED ORDER — CHLORHEXIDINE GLUCONATE CLOTH 2 % EX PADS
6.0000 | MEDICATED_PAD | Freq: Every day | CUTANEOUS | Status: DC
  Administered 2018-04-11 – 2018-04-14 (×3): 6 via TOPICAL

## 2018-04-10 MED ORDER — BICTEGRAVIR-EMTRICITAB-TENOFOV 50-200-25 MG PO TABS
1.0000 | ORAL_TABLET | Freq: Every day | ORAL | Status: DC
  Administered 2018-04-11 – 2018-04-15 (×5): 1 via ORAL
  Filled 2018-04-10 (×5): qty 1

## 2018-04-10 MED ORDER — HYDRALAZINE HCL 50 MG PO TABS
75.0000 mg | ORAL_TABLET | Freq: Three times a day (TID) | ORAL | Status: DC
  Administered 2018-04-10 – 2018-04-12 (×4): 75 mg via ORAL
  Filled 2018-04-10 (×5): qty 1

## 2018-04-10 MED ORDER — TRAZODONE HCL 50 MG PO TABS
50.0000 mg | ORAL_TABLET | Freq: Every evening | ORAL | Status: DC | PRN
Start: 2018-04-10 — End: 2018-04-15
  Administered 2018-04-10 – 2018-04-12 (×3): 50 mg via ORAL
  Filled 2018-04-10 (×3): qty 1

## 2018-04-10 MED ORDER — MEGESTROL ACETATE 40 MG PO TABS
40.0000 mg | ORAL_TABLET | Freq: Every day | ORAL | Status: DC
  Administered 2018-04-12 – 2018-04-15 (×4): 40 mg via ORAL
  Filled 2018-04-10 (×6): qty 1

## 2018-04-10 MED ORDER — ACETAMINOPHEN 325 MG PO TABS
650.0000 mg | ORAL_TABLET | Freq: Four times a day (QID) | ORAL | Status: DC | PRN
  Administered 2018-04-11 – 2018-04-12 (×2): 650 mg via ORAL
  Filled 2018-04-10 (×2): qty 2

## 2018-04-10 MED ORDER — HEPARIN SODIUM (PORCINE) 5000 UNIT/ML IJ SOLN
5000.0000 [IU] | Freq: Three times a day (TID) | INTRAMUSCULAR | Status: DC
  Administered 2018-04-10 – 2018-04-14 (×10): 5000 [IU] via SUBCUTANEOUS
  Filled 2018-04-10 (×12): qty 1

## 2018-04-10 MED ORDER — CALCIUM ACETATE (PHOS BINDER) 667 MG PO CAPS
667.0000 mg | ORAL_CAPSULE | Freq: Three times a day (TID) | ORAL | Status: DC
  Administered 2018-04-10 – 2018-04-14 (×8): 667 mg via ORAL
  Administered 2018-04-15: 1334 mg via ORAL
  Filled 2018-04-10: qty 3
  Filled 2018-04-10 (×12): qty 1

## 2018-04-10 NOTE — Progress Notes (Signed)
Pt arrived from ER via stretcher.  Stood with RN to pivot to bed, balance stable.  Alert and oriented x4, denies any pain currently.  Brother at bedside and does most of pt care for him.  Uses walker to ambulate to BR with stand by assist.  Oriented to room, call light within reach, tele monitor applied.  SR with 1st degree AVB.  Will continue to monitor.

## 2018-04-10 NOTE — ED Triage Notes (Addendum)
Reports having SOB this morning.  Hx of same.  Discharged recently after being anemic.  Worried the SOB may be related to anemia.   Also reports he does home hemodialysis.  Last treatment last night.

## 2018-04-10 NOTE — Consult Note (Signed)
[facility] CONSULT NOTE    Date: 04/10/2018                  Patient Name:  Jeffrey [name]  MRN: [id number]  DOB: 08-29-1956  Age / Sex: 62 y.o., male         PCP: Velna Ochs, MD                 Service Requesting Consult: Dr. Lynnae January                  Reason for Consult: HD             History of Present Illness: Patient is a 62 y.o. male with a PMHx of ESRD 2/2 HTN and NSAID use on home HD 4x/week, HTN, well-controlled HIV, recent NSTEMI, seizures, hyperthyroidism, asthma, and duodenal mass who was recently admitted for acute on chronic anemia in the setting of GI bleed after ERCP for duodenal mass biopsy now presenting with shortness of breath at rest that started yesterday. He also reports orthopnea, PND, and LE swelling starting yesterday. He received a total of 7 units pRBCs during his previous admission and Hgb at the time of discharge was 8.7. He was recently seen by PCP on 7/19 at which time patient was doing well. His Hgb was 9.7 (baseline 8-9). Today, he is afebrile and hypertensive with sBP 210-220s. He is not tachypneic and is satting well on room air. His CXR is consistent with interstitial edema and BNP 2,229.6. He has a persistent L pleural effusion and cardiomegaly as well. His Hgb is stable at 9.7. No history of CHF per chart. Last TTE 05/2017 with EF 65% with severe LVH and moderate AR. His last HD was yesterday with removal of 2L. He has not missed any HD sessions at home. Dry weight 76 kg, but patient has been losing weight.    Medications: Outpatient medications: Medications Prior to Admission  Medication Sig Dispense Refill Last Dose  . albuterol (PROVENTIL) (2.5 MG/3ML) 0.083% nebulizer solution Take 2.5 mg by nebulization every 6 (six) hours as needed for wheezing or shortness of breath.   04/09/2018 at prn  . bictegravir-emtricitabine-tenofovir AF (BIKTARVY) 50-200-25 MG TABS tablet Take 1 tablet by mouth daily. 30 tablet 11 04/10/2018 at Unknown  time  . calcium acetate (PHOSLO) 667 MG capsule Take 667-2,001 mg by mouth See admin instructions. Take [phone] mg three times a day with each meal and 667-1,334 mg with each snack   04/09/2018 at Unknown time  . carvedilol (COREG) 25 MG tablet Take 1 tablet (25 mg total) by mouth 2 (two) times daily with a meal. 60 tablet 5 04/10/2018 at 0630  . cloNIDine (CATAPRES - DOSED IN MG/24 HR) 0.1 mg/24hr patch UNWRAP AND APPLY 1 PATCH TO SKIN EVERY 7 DAYS 4 patch 0 04/10/2018 at Unknown time  . epoetin alfa (EPOGEN,PROCRIT) 2000 UNIT/ML injection Inject 2,000 Units into the vein 3 (three) times a week.    04/09/2018 at Unknown time  . ethyl chloride spray Apply 1 application topically daily as needed (HD- to numb).   12 04/09/2018 at pn  . heparin 1000 UNIT/ML injection Inject 3,000 Units into the vein 4 (four) times a week.    04/09/2018 at Unknown time  . hydrALAZINE (APRESOLINE) 25 MG tablet TAKE 3 TABLETS(75 MG) BY MOUTH EVERY 8 HOURS 42 tablet 0 04/10/2018 at Unknown time  . Ipratropium-Albuterol (COMBIVENT IN) Inhale 2 puffs into the lungs every 6 (six) hours as  needed (as needed for shortness of breath).    Past Week at prn  . iron sucrose (VENOFER) 20 MG/ML injection Inject 100 mg into the vein every Monday.    04/04/2018  . megestrol (MEGACE) 40 MG tablet Take 2 tablets (80 mg total) by mouth 2 (two) times daily. (Patient taking differently: Take 40 mg by mouth daily. ) 120 tablet 0 04/08/2018  . multivitamin (RENA-VIT) TABS tablet Take 1 tablet by mouth daily.     04/10/2018 at Unknown time  . traZODone (DESYREL) 50 MG tablet TAKE 1 TABLET(50 MG) BY MOUTH AT BEDTIME AS NEEDED FOR SLEEP 30 tablet 3 04/08/2018 at prn  . VOLTAREN 1 % GEL APPLY 2 GRAMS EXTERNALLY TO THE AFFECTED AREA FOUR TIMES DAILY (Patient taking differently: Apply 2 g topically 3 (three) times daily as needed (for pain). ) 100 g 1 03/20/2018 at prn  . clotrimazole-betamethasone (LOTRISONE) cream Apply 1 application topically 2 (two) times  daily. (Patient not taking: Reported on 04/10/2018) 30 g 0 Not Taking at p  . omeprazole (PRILOSEC) 20 MG capsule Take 1 capsule (20 mg total) by mouth daily. (Patient not taking: Reported on 04/10/2018) 90 capsule 3 Not Taking at Unknown time    Current medications: No current facility-administered medications for this encounter.       Allergies: Allergies  Allergen Reactions  . Lisinopril Anaphylaxis and Shortness Of Breath    Throat swelling  . Penicillins Anaphylaxis and Other (See Comments)    Childhood allergy Has patient had a PCN reaction causing immediate rash, facial/tongue/throat swelling, SOB or lightheadedness with hypotension: Yes Has patient had a PCN reaction causing severe rash involving mucus membranes or skin necrosis: Unk Has patient had a PCN reaction that required hospitalization: Unk Has patient had a PCN reaction occurring within the last 10 years: No If all of the above answers are "NO", then may proceed with Cephalosporin use.       Past Medical History: Past Medical History:  Diagnosis Date  . Anemia   . Anxiety   . Arthritis   . Asthma    per pt hx  . End stage renal disease on home HD 07/10/2011   Started HD in September 2012 at [facility] with a tunneled HD catheter, now on home HD with NxtStage. Dialyzing through AVF L lower arm with buttonhole technique as of mid 2014. His brother does the HD treatments at home.  They are roommates for 23 years.  The brother works 3rd shift and gets off about 8am and then puts Mr Allnutt on HD in the morning after getting home. Most of the time he does HD about 4 times a week, for about 4 hours per treatment. Cause of ESRD was HTN according to patient. He says he let his health go and ending up with complications, and that he didn't like seeing doctors in those days.  He says he was diagnosed with severe HTN when he lived in [place] in his 40's.   . Hepatitis B carrier ([place])   . HIV infection ([place])   . Hypertension   .  Hyperthyroidism    normal now  . Pneumonia several yrs ago  . Seizure ([place])   . Seizures ([place]) 06/02/2011   x 1 none since  . Thrombocytopenia ([place])      Past Surgical History: Past Surgical History:  Procedure Laterality Date  . AV FISTULA PLACEMENT  06/02/11   Left radiocephalic AVF  . BIOPSY  02/17/2018   Procedure: BIOPSY;  Surgeon: Milus Banister, MD;  Location: [facility];  Service: Endoscopy;;  . COLONOSCOPY    . COLONOSCOPY WITH PROPOFOL N/A 01/27/2018   Procedure: COLONOSCOPY WITH PROPOFOL;  Surgeon: [name], MD;  Location: [facility];  Service: Gastroenterology;  Laterality: N/A;  . CORONARY ANGIOPLASTY    . ERCP  03/21/2018   AT [place]  . ESOPHAGOGASTRODUODENOSCOPY N/A 02/17/2018   Procedure: ESOPHAGOGASTRODUODENOSCOPY (EGD);  Surgeon: Milus Banister, MD;  Location: [facility];  Service: Endoscopy;  Laterality: N/A;  . ESOPHAGOGASTRODUODENOSCOPY (EGD) WITH PROPOFOL N/A 01/27/2018   Procedure: ESOPHAGOGASTRODUODENOSCOPY (EGD) WITH PROPOFOL;  Surgeon: [name], MD;  Location: [facility] ENDOSCOPY;  Service: Gastroenterology;  Laterality: N/A;  . ESOPHAGOGASTRODUODENOSCOPY (EGD) WITH PROPOFOL N/A 04/03/2018   Procedure: ESOPHAGOGASTRODUODENOSCOPY (EGD) WITH PROPOFOL;  Surgeon: Clarene Essex, MD;  Location: [facility];  Service: Endoscopy;  Laterality: N/A;  . EUS N/A 02/17/2018   Procedure: UPPER ENDOSCOPIC ULTRASOUND (EUS) RADIAL;  Surgeon: Milus Banister, MD;  Location: [facility] ENDOSCOPY;  Service: Endoscopy;  Laterality: N/A;  . fistulaogram     x 2 last 2 years  . IR FLUORO GUIDE CV LINE RIGHT  09/16/2017  . IR US GUIDE VASC ACCESS RIGHT  09/16/2017  . IRRIGATION AND DEBRIDEMENT KNEE Left 09/15/2017   Procedure: IRRIGATION AND DEBRIDEMENT KNEE; arthroscopic clean out;  Surgeon: [name], MD;  Location: [facility]S;  Service: Orthopedics;  Laterality: Left;  . OTHER SURGICAL HISTORY     removal temporary HD catheter   . REVISON OF ARTERIOVENOUS FISTULA Left  10/10/2015   Procedure: REVISON OF LEFT RADIOCEPHALIC ARTERIOVENOUS FISTULA;  Surgeon: Angelia Mould, MD;  Location: [facility];  Service: Vascular;  Laterality: Left;  . REVISON OF ARTERIOVENOUS FISTULA Left 02/07/2016   Procedure: REPAIR OF PSEUDO-ANEUREYSM OF LEFT ARM  ARTERIOVENOUS FISTULA;  Surgeon: Angelia Mould, MD;  Location: [facility];  Service: Vascular;  Laterality: Left;  . REVISON OF ARTERIOVENOUS FISTULA Left 02/25/3015   Procedure: PLICATION OF LEFT ARM RADIOCEPHALIC ARTERIOVENOUS FISTULA PSEUDOANEURYSM;  Surgeon: Angelia Mould, MD;  Location: [place];  Service: Vascular;  Laterality: Left;  . RIGHT/LEFT HEART CATH AND CORONARY ANGIOGRAPHY N/A 02/17/2017   Procedure: Right/Left Heart Cath and Coronary Angiography;  Surgeon: Sherren Mocha, MD;  Location: [facility];  Service: Cardiovascular;  Laterality: N/A;  . TEE WITHOUT CARDIOVERSION N/A 09/11/2016   Procedure: TRANSESOPHAGEAL ECHOCARDIOGRAM (TEE);  Surgeon: Dorothy Spark, MD;  Location: [facility] ENDOSCOPY;  Service: Cardiovascular;  Laterality: N/A;     Family History: Family History  Problem Relation Age of Onset  . Hypertension Mother   . Diabetes Mother   . Cancer Father      Social History: Social History   Socioeconomic History  . Marital status: Single    Spouse name: Not on file  . Number of children: Not on file  . Years of education: Not on file  . Highest education level: Not on file  Occupational History  . Not on file  Social Needs  . Financial resource strain: Not on file  . Food insecurity:    Worry: Not on file    Inability: Not on file  . Transportation needs:    Medical: Not on file    Non-medical: Not on file  Tobacco Use  . Smoking status: Former Smoker    Years: 10.00    Types: Cigarettes    Last attempt to quit: 08/08/2015    Years since quitting: 2.6  . Smokeless tobacco: Never Used  .  Tobacco comment: casual smoking for 10 years  Substance and Sexual Activity   . Alcohol use: No    Alcohol/week: 0.0 oz    Comment: quit 2004  . Drug use: No    Comment: uds (+) cocaine in 08/2011 but pt states was taking sudafed at the time  . Sexual activity: Not Currently    Partners: Male  Lifestyle  . Physical activity:    Days per week: Not on file    Minutes per session: Not on file  . Stress: Not on file  Relationships  . Social connections:    Talks on phone: Not on file    Gets together: Not on file    Attends religious service: Not on file    Active member of club or organization: Not on file    Attends meetings of clubs or organizations: Not on file    Relationship status: Not on file  . Intimate partner violence:    Fear of current or ex partner: Not on file    Emotionally abused: Not on file    Physically abused: Not on file    Forced sexual activity: Not on file  Other Topics Concern  . 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 rate (!) 27, height 6\' 1"  (1.854 m), weight 171 lb (77.6 kg), SpO2 100 %.  Weight trends: Filed Weights   04/10/18 0529  Weight: 171 lb (77.6 kg)    Physical Exam: General: Vital signs reviewed and noted. Well-developed, well-nourished, in no acute distress; alert, appropriate and cooperative throughout examination.  Head: Normocephalic, atraumatic.  Eyes: PERRL, EOMI, No signs of anemia or jaundince.  Nose: Mucous membranes moist, not inflammed, nonerythematous.  Throat: Oropharynx nonerythematous, no exudate appreciated.   Neck: No deformities, masses, or tenderness noted.Supple, No carotid Bruits, no JVD.  Lungs:  Mildly tachypenic with decreased breath sounds on the L and bilateral  crackles.  Heart: RRR. S1 and S2 normal without gallop, murmur, or rubs.  Abdomen:  BS normoactive. Soft, Nondistended, non-tender.  No masses or organomegaly.  Extremities: 1+ pretibial edema bilaterally .  Neurologic: A&O X3, no focal deficits noted   Skin: No visible rashes, scars.    Lab results: Basic Metabolic Panel: Recent Labs  Lab 04/04/18 0337 04/08/18 1135 04/10/18 0552  NA 135 136 135  K 4.1 3.7 3.1*  CL 100 101 99  CO2 20* 21* 22  GLUCOSE 95 103* 101*  BUN 146* 103* 77*  CREATININE 11.32* 12.13* 10.83*  CALCIUM 8.1* 8.7* 8.6*  PHOS 1.7*  --   --     Liver Function Tests: Recent Labs  Lab 04/04/18 0337  AST 14*  ALT 8  ALKPHOS 59  BILITOT 0.5  PROT 4.8*  ALBUMIN 2.2*  2.2*   No results for input(s): LIPASE, AMYLASE in the last 168 hours. No results for input(s): AMMONIA in the last 168 hours.  CBC: Recent Labs  Lab 04/04/18 0337 04/08/18 1135 04/10/18 0552  WBC 6.5 8.9 8.8  HGB 8.7* 9.5* 9.7*  HCT 25.7* 27.6* 28.7*  MCV 87.4 87.9 90.3  PLT 120* 152 202  Cardiac Enzymes: No results for input(s): CKTOTAL, CKMB, CKMBINDEX, TROPONINI in the last 168 hours.  BNP: Invalid input(s): POCBNP  CBG: No results for input(s): GLUCAP in the last 168 hours.  Microbiology: Results for orders placed or performed during the hospital encounter of 03/31/18  MRSA PCR Screening     Status: None   Collection Time: 03/31/18  9:49 PM  Result Value Ref Range Status   MRSA by PCR NEGATIVE NEGATIVE Final    Comment:        The GeneXpert MRSA Assay (FDA approved for NASAL specimens only), is one component of a comprehensive MRSA colonization surveillance program. It is not intended to diagnose MRSA infection nor to guide or monitor treatment for MRSA infections. Performed at [facility], [place] [address] [place], [place] [address]     Coagulation Studies: No results for input(s): LABPROT, INR in the last 72 hours.  Urinalysis: No results  for input(s): COLORURINE, LABSPEC, PHURINE, GLUCOSEU, HGBUR, BILIRUBINUR, KETONESUR, PROTEINUR, UROBILINOGEN, NITRITE, LEUKOCYTESUR in the last 72 hours.  Invalid input(s): APPERANCEUR    Imaging: Dg Chest 2 View  Result Date: 04/10/2018 CLINICAL DATA:  Shortness of breath this morning. Patient does home hemodialysis. EXAM: CHEST - 2 VIEW COMPARISON:  03/07/2018 FINDINGS: Lungs are adequately inflated demonstrate development of a moderate size left pleural effusion likely associated atelectasis. Hazy density over the right base with prominence of the perihilar markings bilaterally likely mild interstitial edema. Infection in the lung bases is possible. Mild stable cardiomegaly. Remainder of the exam is unchanged. IMPRESSION: Development of a moderate size left pleural effusion likely with associated basilar atelectasis. Stable cardiomegaly with mild interstitial edema. Infection in the lung bases is possible. Electronically Signed   By: Marin Olp M.D.   On: 04/10/2018 07:15      Assessment & Plan: Pt is a 62 y.o. yo male with a PMHX of ESRD 2/2 HTN and NSAID use on home HD 4x/week, HTN, well-controlled HIV, recent NSTEMI, seizures, hyperthyroidism, asthma, and duodenal mass who was recently admitted for acute on chronic anemia in the setting of GI bleed after ERCP for duodenal mass biopsy now presenting with shortness of breath at rest, orthopnea, PND that started yesterday.   ESRD on HD presenting with V overload - Hypervolemic on exam with rales, orthopnea, and LE swelling. His shortness of breath could be 2/2 flash pulm edema in the setting of sBP 200s.  Last  HD yesterday with 2L removal. Dry weight 76 kg, but with recent weight loss will need to decrease. Plan for HD tomorrow.  HTN - sBP 200s. Continue home meds: Coreg, clonidine, and Hydralazine. Plan for HD tomorrow.  Volume- Hypervolemic on exam. Plan for HD tomorrow.  Anemia: at baseline   04/10/2018

## 2018-04-10 NOTE — H&P (Signed)
Date: 04/10/2018               Patient Name:  Jeffrey [name] MRN: [id number]  DOB: 1956/02/20 Age / Sex: 62 y.o., male   PCP: Velna Ochs, MD         Medical Service: Internal Medicine Teaching Service         Attending Physician: Dr. Bartholomew Crews, MD    First Contact: Dr. Koleen Distance Pager: [phone]  Second Contact: Dr. Jari Favre Pager: [phone]       After Hours (After 5p/  First Contact Pager: [phone]  weekends / holidays): Second Contact Pager: [phone]   Chief Complaint: shortness of breath  History of Present Illness: Jeffrey [name] is a 62 y/o [place] gentleman with PMHx of ESRD likely 2/2 HTN and NSAID use on HD 4x/week since 2012, HTN, HIV (CD4 390 on 10/2017), asthma, recent NSTEMI, and duodenal mass who presents with intermittent progressively worsening shortness of breath for the past few days.  Patient was recently admitted on 7/11 for acute on chronic anemia secondary to upper GI bleed s/p ERCP 7/1 for duodenal mass biopsy at [facility]. His hgb previous admission was 5.4; he received a total of 7 units pRBCs and discharged after hgb stabilized at 8.7. He had his follow-up appointment at [facility] on 7/19 and was noted to be feeling well. Hgb recheck was 9.5. Patient presented to ED this morning with shortness of breath because this was similar to how he felt when his hgb was low prior to last admission. He endorses orthopnea and PND. Also c/o intermittent chest pain that he describes as "difficult to catch his breath." Endorses being consistent with HD. He states that his dry weight is 76 kg, but is not able to recall what his weights have been the last few days. He does not drink more than 1/2 cup of fluid per day, but notes he has been eating more the last 2 weeks and feels his abdomen has gotten bigger.   He denies any headaches, fevers, chills, syncope, palpitations, abdominal pain, n/v, melena, hematochezia.   Meds:  Current Meds  Medication Sig  . albuterol  (PROVENTIL) (2.5 MG/3ML) 0.083% nebulizer solution Take 2.5 mg by nebulization every 6 (six) hours as needed for wheezing or shortness of breath.  . bictegravir-emtricitabine-tenofovir AF (BIKTARVY) 50-200-25 MG TABS tablet Take 1 tablet by mouth daily.  . calcium acetate (PHOSLO) 667 MG capsule Take 667-2,001 mg by mouth See admin instructions. Take [phone] mg three times a day with each meal and 667-1,334 mg with each snack  . carvedilol (COREG) 25 MG tablet Take 1 tablet (25 mg total) by mouth 2 (two) times daily with a meal.  . cloNIDine (CATAPRES - DOSED IN MG/24 HR) 0.1 mg/24hr patch UNWRAP AND APPLY 1 PATCH TO SKIN EVERY 7 DAYS  . epoetin alfa (EPOGEN,PROCRIT) 2000 UNIT/ML injection Inject 2,000 Units into the vein 3 (three) times a week.   . ethyl chloride spray Apply 1 application topically daily as needed (HD- to numb).   . heparin 1000 UNIT/ML injection Inject 3,000 Units into the vein 4 (four) times a week.   . hydrALAZINE (APRESOLINE) 25 MG tablet TAKE 3 TABLETS(75 MG) BY MOUTH EVERY 8 HOURS  . Ipratropium-Albuterol (COMBIVENT IN) Inhale 2 puffs into the lungs every 6 (six) hours as needed (as needed for shortness of breath).   . iron sucrose (VENOFER) 20 MG/ML injection Inject 100 mg into the vein every Monday.   Marland Kitchen  megestrol (MEGACE) 40 MG tablet Take 2 tablets (80 mg total) by mouth 2 (two) times daily. (Patient taking differently: Take 40 mg by mouth daily. )  . multivitamin (RENA-VIT) TABS tablet Take 1 tablet by mouth daily.    . traZODone (DESYREL) 50 MG tablet TAKE 1 TABLET(50 MG) BY MOUTH AT BEDTIME AS NEEDED FOR SLEEP  . VOLTAREN 1 % GEL APPLY 2 GRAMS EXTERNALLY TO THE AFFECTED AREA FOUR TIMES DAILY (Patient taking differently: Apply 2 g topically 3 (three) times daily as needed (for pain). )  . [DISCONTINUED] ALPRAZolam (XANAX) 0.25 MG tablet Take 0.25 mg by mouth 3 (three) times daily as needed for anxiety.      Allergies: Allergies as of 04/10/2018 - Review Complete  04/10/2018  Allergen Reaction Noted  . Lisinopril Anaphylaxis and Shortness Of Breath 08/31/2012  . Penicillins Anaphylaxis and Other (See Comments) 03/14/2018   Past Medical History:  Diagnosis Date  . Anemia   . Anxiety   . Arthritis   . Asthma    per pt hx  . End stage renal disease on home HD 07/10/2011   Started HD in September 2012 at [facility] with a tunneled HD catheter, now on home HD with NxtStage. Dialyzing through AVF L lower arm with buttonhole technique as of mid 2014. His brother does the HD treatments at home.  They are roommates for 23 years.  The brother works 3rd shift and gets off about 8am and then puts Mr [name] on HD in the morning after getting home. Most of the time he does HD about 4 times a week, for about 4 hours per treatment. Cause of ESRD was HTN according to patient. He says he let his health go and ending up with complications, and that he didn't like seeing doctors in those days.  He says he was diagnosed with severe HTN when he lived in [place] in his 64's.   . Hepatitis B carrier ([place])   . HIV infection ([place])   . Hypertension   . Hyperthyroidism    normal now  . Pneumonia several yrs ago  . Seizure ([place])   . Seizures ([place]) 06/02/2011   x 1 none since  . Thrombocytopenia ([place])     Family History: mother with HTN  Social History: no tobacco, alcohol or illicit drug use. He lives with his brother who helps him with home HD.   Review of Systems: A complete ROS was negative except as per HPI.  Physical Exam: Blood pressure (!) 201/95, pulse 77, temperature 98.6 F (37 C), temperature source Oral, resp. rate (!) 30, height 6\' 1"  (1.854 m), weight 183 lb 10.3 oz (83.3 kg), SpO2 99 %. General: well appearing, alert and oriented male lying in bed in NAD HEENT: normocephalic, atraumatic. Moist mucous membranes Neck: supple, increased JVP Cardiovascular: RRR. 2/6 SEM heard best at RUSB.  Pulmonary: no increased work of breathing; lung with bilateral rales  at bases Abdominal: BS+. Abdomen is soft, non-tender, non-distended Ext: trace edema in bilateral lower extremities; AVF of distal RLE.  Skin: warm and dry Psych: appropriate mood and affect    Assessment & Plan by Problem: Active Problems:   Dyspnea 1. Dyspnea 2/2 volume overload in the setting ESRD - patient's dry weight is 76 kg; today he is 83.3 kg - he reports being compliant with HD and fluid intake, but does endorse eating more which likely includes increased salt intake - CXR and exam consistent with volume overload - nephrology has been consulted  for inpatient dialysis management - he is saturating well on room air - EKG is unchanged from previous; troponin negative   2. Aortic insufficiency - last echo was 02/2017 which showed moderate aortic valve regurgitation; EF 65-70% - may be playing a role in volume status; will consider repeat echo if patient's symptoms do not improve after dialysis.   3. HTN Recent NSTEMI 2/2 hypertensive emergency at [facility] admission on 6/24.   - currently asymptomatic; did not take all of his blood pressure medicines this morning before coming to ED - EKG unchanged; troponin negative - will likely improve with resumption of home BP meds and HD - due for clonidine patch today which we gave; continuing home hydralazine and carvedilol as prescribed.  - aspirin being held until follow-up appt with GI at [facility] in the setting of recent upper GI bleed   4. HIV - well controlled based on last CD4 count; continue home antivirals     Dispo: Admit patient to Observation with expected length of stay less than 2 midnights.  SignedDelice Bison, DO 04/10/2018, 3:03 PM  Pager: [phone]

## 2018-04-10 NOTE — ED Provider Notes (Signed)
[name] EMERGENCY DEPARTMENT Provider Note  CSN: [id number] Arrival date & time: 04/10/18  6644  History   Chief Complaint Chief Complaint  Patient presents with  . Shortness of Breath    HPI Jeffrey [name] is a 62 y.o. male with a complex medical history of ESRD (home hemodialysis 4x per week), HIV, NSTEMI, asthma, hyperthyroidism, duodenal masses and seizures who presented to the ED for SOB x1 day. SOB occurs at rest, but states it is not as noticeable with exertion. Relief achieved when lying down, but states it is worse with sitting up. Denies fever, chest pain, cough, diaphoresis, dizziness, lightheadedness, melena/hematochezia, hematemesis or changes in bowel or urinary habits. Denies recent sick contacts or new exposures. His last home hemodialysis treatment was last night.  Additional history obtained by medical chart. Patient was seen by his PCP on 04/08/18. There were no complaints of SOB at that time and overall it appears that the patient had been doing well since hospital discharge on 04/04/18.  Past Medical History:  Diagnosis Date  . Anemia   . Anxiety   . Arthritis   . Asthma    per pt hx  . End stage renal disease on home HD 07/10/2011   Started HD in September 2012 at [facility] with a tunneled HD catheter, now on home HD with [facility]. Dialyzing through AVF L lower arm with buttonhole technique as of mid 2014. His brother does the HD treatments at home.  They are roommates for 23 years.  The brother works 3rd shift and gets off about 8am and then puts [name] on HD in the morning after getting home. Most of the time he does HD about 4 times a week, for about 4 hours per treatment. Cause of ESRD was HTN according to patient. He says he let his health go and ending up with complications, and that he didn't like seeing doctors in those days.  He says he was diagnosed with severe HTN when he lived in [place] in his 27's.   . Hepatitis B carrier ([place])   . HIV  infection ([place])   . Hypertension   . Hyperthyroidism    normal now  . Pneumonia several yrs ago  . Seizure ([place])   . Seizures ([place]) 06/02/2011   x 1 none since  . Thrombocytopenia [facility])     Patient Active Problem List   Diagnosis Date Noted  . Loud snoring 04/08/2018  . Upper GI bleed 04/08/2018  . Malnutrition of moderate degree 02/23/2018  . Seizure ([place]) 02/22/2018  . Encounter for central line placement   . Lactic acidosis   . Acute uremia   . Mass of duodenum   . Anemia of chronic kidney failure, unspecified stage   . Infectious joint disease 10/04/2017  . Symptomatic anemia 09/23/2017  . Antibiotic long-term use   . Septic arthritis ([place]) 09/15/2017  . Encounter for incision and drainage procedure 09/15/2017  . Effusion of left knee 09/08/2017  . Intertrigo 07/09/2017  . AC joint arthropathy 06/16/2017  . Pseudoaneurysm of arteriovenous dialysis fistula ([place]) 12/23/2016  . Eczema 12/10/2016  . Bilateral low back pain without sciatica   . Aortic valve insufficiency   . Fall 09/08/2016  . Knee abrasion, right, initial encounter 09/08/2016  . Lumbosacral spondylosis without myelopathy 08/03/2016  . Pulmonary nodules 08/03/2016  . Essential hypertension   . Anemia of chronic disease 04/08/2016  . Secondary hyperparathyroidism ([place]) 02/09/2014  . Asthma 02/09/2014  . Nuclear sclerotic  cataract of both eyes 12/12/2013  . Myopia with presbyopia of both eyes 10/26/2013  . COPD (chronic obstructive pulmonary disease) ([place]) 06/27/2013  . Thrombocytopenia ([place]) 01/09/2013  . HBV (hepatitis B virus) infection 01/09/2013  . Anxiety 07/22/2011  . End stage renal disease on home HD 07/10/2011  . HIV disease ([place]) 06/17/2011    Past Surgical History:  Procedure Laterality Date  . AV FISTULA PLACEMENT  06/02/11   Left radiocephalic AVF  . BIOPSY  02/17/2018   Procedure: BIOPSY;  Surgeon: Milus Banister, MD;  Location: [facility] ENDOSCOPY;  Service: Endoscopy;;  . COLONOSCOPY     . COLONOSCOPY WITH PROPOFOL N/A 01/27/2018   Procedure: COLONOSCOPY WITH PROPOFOL;  Surgeon: [name], MD;  Location: [facility] ENDOSCOPY;  Service: Gastroenterology;  Laterality: N/A;  . CORONARY ANGIOPLASTY    . ERCP  03/21/2018   AT [place]  . ESOPHAGOGASTRODUODENOSCOPY N/A 02/17/2018   Procedure: ESOPHAGOGASTRODUODENOSCOPY (EGD);  Surgeon: Milus Banister, MD;  Location: [facility] ENDOSCOPY;  Service: Endoscopy;  Laterality: N/A;  . ESOPHAGOGASTRODUODENOSCOPY (EGD) WITH PROPOFOL N/A 01/27/2018   Procedure: ESOPHAGOGASTRODUODENOSCOPY (EGD) WITH PROPOFOL;  Surgeon: [name], MD;  Location: [facility] ENDOSCOPY;  Service: Gastroenterology;  Laterality: N/A;  . ESOPHAGOGASTRODUODENOSCOPY (EGD) WITH PROPOFOL N/A 04/03/2018   Procedure: ESOPHAGOGASTRODUODENOSCOPY (EGD) WITH PROPOFOL;  Surgeon: Clarene Essex, MD;  Location: [place];  Service: Endoscopy;  Laterality: N/A;  . EUS N/A 02/17/2018   Procedure: UPPER ENDOSCOPIC ULTRASOUND (EUS) RADIAL;  Surgeon: Milus Banister, MD;  Location: [facility] ENDOSCOPY;  Service: Endoscopy;  Laterality: N/A;  . fistulaogram     x 2 last 2 years  . IR FLUORO GUIDE CV LINE RIGHT  09/16/2017  . IR US GUIDE VASC ACCESS RIGHT  09/16/2017  . IRRIGATION AND DEBRIDEMENT KNEE Left 09/15/2017   Procedure: IRRIGATION AND DEBRIDEMENT KNEE; arthroscopic clean out;  Surgeon: [name], MD;  Location: [facility] ORS;  Service: Orthopedics;  Laterality: Left;  . OTHER SURGICAL HISTORY     removal temporary HD catheter   . REVISON OF ARTERIOVENOUS FISTULA Left 10/10/2015   Procedure: REVISON OF LEFT RADIOCEPHALIC ARTERIOVENOUS FISTULA;  Surgeon: Angelia Mould, MD;  Location: [place];  Service: Vascular;  Laterality: Left;  . REVISON OF ARTERIOVENOUS FISTULA Left 02/07/2016   Procedure: REPAIR OF PSEUDO-ANEUREYSM OF LEFT ARM  ARTERIOVENOUS FISTULA;  Surgeon: Angelia Mould, MD;  Location: [place];  Service: Vascular;  Laterality: Left;  . REVISON OF ARTERIOVENOUS FISTULA Left  2/72/5366   Procedure: PLICATION OF LEFT ARM RADIOCEPHALIC ARTERIOVENOUS FISTULA PSEUDOANEURYSM;  Surgeon: Angelia Mould, MD;  Location: [place];  Service: Vascular;  Laterality: Left;  . RIGHT/LEFT HEART CATH AND CORONARY ANGIOGRAPHY N/A 02/17/2017   Procedure: Right/Left Heart Cath and Coronary Angiography;  Surgeon: Sherren Mocha, MD;  Location: [place] CV LAB;  Service: Cardiovascular;  Laterality: N/A;  . TEE WITHOUT CARDIOVERSION N/A 09/11/2016   Procedure: TRANSESOPHAGEAL ECHOCARDIOGRAM (TEE);  Surgeon: Dorothy Spark, MD;  Location: [facility] ENDOSCOPY;  Service: Cardiovascular;  Laterality: N/A;        Home Medications    Prior to Admission medications   Medication Sig Start Date End Date Taking? Authorizing Provider  albuterol (PROVENTIL) (2.5 MG/3ML) 0.083% nebulizer solution Take 2.5 mg by nebulization every 6 (six) hours as needed for wheezing or shortness of breath.   Yes [provider]  bictegravir-emtricitabine-tenofovir AF (BIKTARVY) 50-200-25 MG TABS tablet Take 1 tablet by mouth daily. 12/27/17  Yes Campbell Riches, MD  calcium acetate (PHOSLO) 667 MG capsule  Take 667-2,001 mg by mouth See admin instructions. Take [phone] mg three times a day with each meal and 667-1,334 mg with each snack 06/06/15  Yes [provider]  carvedilol (COREG) 25 MG tablet Take 1 tablet (25 mg total) by mouth 2 (two) times daily with a meal. 04/04/18  Yes Alphonzo Grieve, MD  cloNIDine (CATAPRES - DOSED IN MG/24 HR) 0.1 mg/24hr patch UNWRAP AND APPLY 1 PATCH TO SKIN EVERY 7 DAYS 01/28/18  Yes Velna Ochs, MD  epoetin alfa (EPOGEN,PROCRIT) 2000 UNIT/ML injection Inject 2,000 Units into the vein 3 (three) times a week.    Yes [provider]  ethyl chloride spray Apply 1 application topically daily as needed (HD- to numb).  05/20/15  Yes [provider]  heparin 1000 UNIT/ML injection Inject 3,000 Units into the vein 4 (four) times a week.    Yes  [provider]  hydrALAZINE (APRESOLINE) 25 MG tablet TAKE 3 TABLETS(75 MG) BY MOUTH EVERY 8 HOURS 03/25/18  Yes Velna Ochs, MD  Ipratropium-Albuterol (COMBIVENT IN) Inhale 2 puffs into the lungs every 6 (six) hours as needed (as needed for shortness of breath).    Yes [provider]  iron sucrose (VENOFER) 20 MG/ML injection Inject 100 mg into the vein every Monday.    Yes [provider]  megestrol (MEGACE) 40 MG tablet Take 2 tablets (80 mg total) by mouth 2 (two) times daily. Patient taking differently: Take 40 mg by mouth daily.  02/28/18  Yes Kathi Ludwig, MD  multivitamin (RENA-VIT) TABS tablet Take 1 tablet by mouth daily.     Yes [provider]  traZODone (DESYREL) 50 MG tablet TAKE 1 TABLET(50 MG) BY MOUTH AT BEDTIME AS NEEDED FOR SLEEP 01/31/18  Yes Campbell Riches, MD  VOLTAREN 1 % GEL APPLY 2 GRAMS EXTERNALLY TO THE AFFECTED AREA FOUR TIMES DAILY Patient taking differently: Apply 2 g topically 3 (three) times daily as needed (for pain).  09/08/17  Yes Campbell Riches, MD  clotrimazole-betamethasone (LOTRISONE) cream Apply 1 application topically 2 (two) times daily. Patient not taking: Reported on 04/10/2018 09/08/17   Campbell Riches, MD  omeprazole (PRILOSEC) 20 MG capsule Take 1 capsule (20 mg total) by mouth daily. Patient not taking: Reported on 04/10/2018 01/31/18   [name], MD    Family History Family History  Problem Relation Age of Onset  . Hypertension Mother   . Diabetes Mother   . Cancer Father     Social History Social History   Tobacco Use  . Smoking status: Former Smoker    Years: 10.00    Types: Cigarettes    Last attempt to quit: 08/08/2015    Years since quitting: 2.6  . Smokeless tobacco: Never Used  . Tobacco comment: casual smoking for 10 years  Substance Use Topics  . Alcohol use: No    Alcohol/week: 0.0 oz    Comment: quit 2004  . Drug use: No    Comment: uds (+) cocaine in 08/2011  but pt states was taking sudafed at the time     Allergies   Lisinopril and Penicillins   Review of Systems Review of Systems  Constitutional: Positive for fatigue. Negative for appetite change, fever and unexpected weight change.  HENT: Negative.   Eyes: Negative.   Respiratory: Positive for shortness of breath. Negative for cough and chest tightness.   Cardiovascular: Negative for chest pain, palpitations and leg swelling.  Gastrointestinal: Negative.   Genitourinary: Negative.   Musculoskeletal: Negative  for arthralgias and joint swelling.  Skin: Negative.   Allergic/Immunologic: Positive for immunocompromised state.  Neurological: Negative for dizziness, syncope, weakness and light-headedness.     Physical Exam Updated Vital Signs BP (!) 200/90   Pulse 80   Temp 98.9 F (37.2 C) (Oral)   Resp 19   Ht 6\' 1"  (1.854 m)   Wt 77.6 kg (171 lb)   SpO2 100%   BMI 22.56 kg/m   Physical Exam  Constitutional: He does not have a sickly appearance. No distress.  Eyes: Pupils are equal, round, and reactive to light. Conjunctivae, EOM and lids are normal.  Cardiovascular: Normal rate, regular rhythm, normal heart sounds and intact distal pulses.  Pulmonary/Chest: Effort normal and breath sounds normal. No tachypnea. No respiratory distress. He has no decreased breath sounds. He has no rales.  Abdominal: Soft. Normal appearance and bowel sounds are normal. There is no tenderness.  Musculoskeletal: Normal range of motion.  Neurological: He has normal strength. No sensory deficit. He exhibits normal muscle tone.  Skin: Skin is warm and intact. He is not diaphoretic. No pallor.  Nursing note and vitals reviewed.    ED Treatments / Results  Labs (all labs ordered are listed, but only abnormal results are displayed) Labs Reviewed  BASIC METABOLIC PANEL - Abnormal; Notable for the following components:      Result Value   Potassium 3.1 (*)    Glucose, Bld 101 (*)    BUN 77 (*)     Creatinine, Ser 10.83 (*)    Calcium 8.6 (*)    GFR calc non Af Amer 4 (*)    GFR calc Af Amer 5 (*)    All other components within normal limits  CBC - Abnormal; Notable for the following components:   RBC 3.18 (*)    Hemoglobin 9.7 (*)    HCT 28.7 (*)    RDW 17.2 (*)    All other components within normal limits  BRAIN NATRIURETIC PEPTIDE - Abnormal; Notable for the following components:   B Natriuretic Peptide 2,229.6 (*)    All other components within normal limits  I-STAT TROPONIN, ED    EKG EKG Interpretation  Date/Time:  Sunday April 10 2018 07:40:12 EDT Ventricular Rate:  81 PR Interval:    QRS Duration: 108 QT Interval:  410 QTC Calculation: 476 R Axis:   7 Text Interpretation:  Sinus rhythm Prolonged PR interval Consider left atrial enlargement Probable left ventricular hypertrophy Nonspecific T abnormalities, lateral leads Anterior ST elevation, probably due to LVH Borderline prolonged QT interval similar to prior 7/19 Confirmed by Aletta Edouard [phone]) on 04/10/2018 8:29:37 AM   Radiology Dg Chest 2 View  Result Date: 04/10/2018 CLINICAL DATA:  Shortness of breath this morning. Patient does home hemodialysis. EXAM: CHEST - 2 VIEW COMPARISON:  03/07/2018 FINDINGS: Lungs are adequately inflated demonstrate development of a moderate size left pleural effusion likely associated atelectasis. Hazy density over the right base with prominence of the perihilar markings bilaterally likely mild interstitial edema. Infection in the lung bases is possible. Mild stable cardiomegaly. Remainder of the exam is unchanged. IMPRESSION: Development of a moderate size left pleural effusion likely with associated basilar atelectasis. Stable cardiomegaly with mild interstitial edema. Infection in the lung bases is possible. Electronically Signed   By: Marin Olp M.D.   On: 04/10/2018 07:15    Procedures Procedures (including critical care time)  Medications Ordered in ED Medications  - No data to display   Initial Impression / Assessment  and Plan / ED Course  Triage vital signs and the nursing notes have been reviewed.  Pertinent labs & imaging results that were available during care of the patient were reviewed and considered in medical decision making (see chart for details).  Patient presents with SOB that began last night. However, on exam is breathing well and does not appear in respiratory distress. His oxygen saturations have been above 95% at rest and only dropped to 94% with ambulation. Physical exam is also reassuring as no crackles, rhonchi or decreased breath sounds are heard to suggest a PNA, effusion or intrapleural issue. However, this is the 3rd time in the last 2 months that patient continues to have SOB and orthopnea. Possible new HF.  Clinical Course as of Apr 10 953  Sun Apr 10, 2018  0638 Pt's last blood work was done on 04/08/18. Today pt's Hgb 9.7 is stable and even slightly improved and creatinine is down from 12 to 10. These are all encouraging signs.   [GM]  9528 CXR is reassuring. Cardiomegaly seen today is stable and unchanged from past imaging. At baseline, he has bilateral atelectasis which is also shown today. Possible consolidation/PNA in lower lobes, but patient does not have other s/s that would suggest PNA or infectious process. Left pleural effusion seen on CT on 04/01/18. Unclear if this effusion has significantly changed in size or if it is unchanged. CXR appears unchanged from 02/2018 imaging.   [GM]  0807 EKG-sinus rhythm with first-degree AV block rate of 81, QTC prolongation, nonspecific ST-T wave changes similar to prior EKG 7/19   [MB]  0812 BNP elevated at > 2000 today which further points to possible HF. Consult placed to internal medicine resident to discuss case.   [GM]  0820 Oxygen sats stayed at 98% on ambulation. Dropped to 94%. Endorsed SOB and dizziness with ambulation.   [GM]  [id number] Case discussed with internal medicine.  Internal medicine will admit the patient given the significant increase in BNP and that he continues to have similar complaints home hemodialysis. Internal medicine requested nephrology be called as consult as well.   [GM]  462 62 year old male here with 2 weeks of increased shortness of breath mainly occurring at night.  His brother relates that he hears some gasping when he sleeping and it sounds like the primary care team has working on getting him set up with a sleep study but that has not been scheduled.  He does not notice the shortness of breath during the day and he has not endorse any fever cough.  His x-ray shows probably a worsening left effusion but it strange however this does not bother him except when he is sleeping.  He is getting some screening labs chest x-ray and will review with the internal medicine team as he just got off their service.   [MB]  [phone] Case discussed with nephrology who will gladly follow during this admission.   [GM]    Clinical Course User Index [GM] Glyndon Tursi, Jonelle Sports, PA-C [MB] Hayden Rasmussen, MD    Final Clinical Impressions(s) / ED Diagnoses  1. Shortness of Breath. Case discussed with internal medicine who will admit the patient. Consult placed with nephrology to assist.  Dispo: Admit. Internal Medicine will admit with nephrology to follow.  Final diagnoses:  SOB (shortness of breath)    ED Discharge Orders    None        Junita Push 04/10/18 4401    Orpah Greek,  MD 04/11/18 1947

## 2018-04-10 NOTE — ED Notes (Signed)
Checked pt's pulse ox while ambulating, pt's O2 stayed around 98% most of the time, dropped to 94% at one point, pt became dizzy and short of breath during ambulation. Pt is now back in bed.

## 2018-04-11 ENCOUNTER — Inpatient Hospital Stay (HOSPITAL_COMMUNITY): Payer: Medicare Other

## 2018-04-11 DIAGNOSIS — I440 Atrioventricular block, first degree: Secondary | ICD-10-CM

## 2018-04-11 DIAGNOSIS — J45909 Unspecified asthma, uncomplicated: Secondary | ICD-10-CM

## 2018-04-11 DIAGNOSIS — N186 End stage renal disease: Secondary | ICD-10-CM

## 2018-04-11 DIAGNOSIS — I351 Nonrheumatic aortic (valve) insufficiency: Secondary | ICD-10-CM

## 2018-04-11 DIAGNOSIS — I252 Old myocardial infarction: Secondary | ICD-10-CM

## 2018-04-11 DIAGNOSIS — R0600 Dyspnea, unspecified: Secondary | ICD-10-CM

## 2018-04-11 DIAGNOSIS — Z21 Asymptomatic human immunodeficiency virus [HIV] infection status: Secondary | ICD-10-CM

## 2018-04-11 DIAGNOSIS — R011 Cardiac murmur, unspecified: Secondary | ICD-10-CM

## 2018-04-11 DIAGNOSIS — I120 Hypertensive chronic kidney disease with stage 5 chronic kidney disease or end stage renal disease: Secondary | ICD-10-CM

## 2018-04-11 DIAGNOSIS — E8770 Fluid overload, unspecified: Principal | ICD-10-CM

## 2018-04-11 DIAGNOSIS — Z992 Dependence on renal dialysis: Secondary | ICD-10-CM

## 2018-04-11 DIAGNOSIS — Z79899 Other long term (current) drug therapy: Secondary | ICD-10-CM

## 2018-04-11 LAB — COMPREHENSIVE METABOLIC PANEL
ALBUMIN: 2.5 g/dL — AB (ref 3.5–5.0)
ALT: 9 U/L (ref 0–44)
ANION GAP: 16 — AB (ref 5–15)
AST: 18 U/L (ref 15–41)
Alkaline Phosphatase: 67 U/L (ref 38–126)
BUN: 30 mg/dL — ABNORMAL HIGH (ref 8–23)
CO2: 25 mmol/L (ref 22–32)
Calcium: 7.9 mg/dL — ABNORMAL LOW (ref 8.9–10.3)
Chloride: 96 mmol/L — ABNORMAL LOW (ref 98–111)
Creatinine, Ser: 6.02 mg/dL — ABNORMAL HIGH (ref 0.61–1.24)
GFR calc Af Amer: 10 mL/min — ABNORMAL LOW (ref >60)
GFR calc non Af Amer: 9 mL/min — ABNORMAL LOW (ref >60)
GLUCOSE: 120 mg/dL — AB (ref 70–99)
POTASSIUM: 3.5 mmol/L (ref 3.5–5.1)
SODIUM: 137 mmol/L (ref 135–145)
Total Bilirubin: 0.5 mg/dL (ref 0.3–1.2)
Total Protein: 5.8 g/dL — ABNORMAL LOW (ref 6.5–8.1)

## 2018-04-11 LAB — BLOOD GAS, ARTERIAL
Acid-base deficit: 3.1 mmol/L — ABNORMAL HIGH (ref 0.0–2.0)
Bicarbonate: 21.8 mmol/L (ref 20.0–28.0)
Drawn by: 246101
FIO2: 100
O2 SAT: 99.7 %
PATIENT TEMPERATURE: 98.6
PO2 ART: 380 mmHg — AB (ref 83.0–108.0)
pCO2 arterial: 41.9 mmHg (ref 32.0–48.0)
pH, Arterial: 7.337 — ABNORMAL LOW (ref 7.350–7.450)

## 2018-04-11 LAB — CBC
HCT: 26.4 % — ABNORMAL LOW (ref 39.0–52.0)
HCT: 29.2 % — ABNORMAL LOW (ref 39.0–52.0)
Hemoglobin: 9 g/dL — ABNORMAL LOW (ref 13.0–17.0)
Hemoglobin: 9.8 g/dL — ABNORMAL LOW (ref 13.0–17.0)
MCH: 30.4 pg (ref 26.0–34.0)
MCH: 30.1 pg (ref 26.0–34.0)
MCHC: 34.1 g/dL (ref 30.0–36.0)
MCHC: 33.6 g/dL (ref 30.0–36.0)
MCV: 89.2 fL (ref 78.0–100.0)
MCV: 89.6 fL (ref 78.0–100.0)
PLATELETS: 212 10*3/uL (ref 150–400)
Platelets: 187 10*3/uL (ref 150–400)
RBC: 2.96 MIL/uL — ABNORMAL LOW (ref 4.22–5.81)
RBC: 3.26 MIL/uL — ABNORMAL LOW (ref 4.22–5.81)
RDW: 16.7 % — AB (ref 11.5–15.5)
RDW: 17.1 % — AB (ref 11.5–15.5)
WBC: 8.6 10*3/uL (ref 4.0–10.5)
WBC: 14.6 10*3/uL — ABNORMAL HIGH (ref 4.0–10.5)

## 2018-04-11 LAB — RENAL FUNCTION PANEL
ANION GAP: 14 (ref 5–15)
Albumin: 2.3 g/dL — ABNORMAL LOW (ref 3.5–5.0)
BUN: 87 mg/dL — AB (ref 8–23)
CHLORIDE: 100 mmol/L (ref 98–111)
CO2: 21 mmol/L — ABNORMAL LOW (ref 22–32)
Calcium: 8.2 mg/dL — ABNORMAL LOW (ref 8.9–10.3)
Creatinine, Ser: 12.17 mg/dL — ABNORMAL HIGH (ref 0.61–1.24)
GFR calc Af Amer: 4 mL/min — ABNORMAL LOW (ref >60)
GFR calc non Af Amer: 4 mL/min — ABNORMAL LOW (ref >60)
GLUCOSE: 96 mg/dL (ref 70–99)
POTASSIUM: 3.5 mmol/L (ref 3.5–5.1)
Phosphorus: 3.6 mg/dL (ref 2.5–4.6)
Sodium: 135 mmol/L (ref 135–145)

## 2018-04-11 MED ORDER — ALTEPLASE 2 MG IJ SOLR: 2.0000 mg | Freq: Once | INTRAMUSCULAR | Status: DC | PRN

## 2018-04-11 MED ORDER — LIDOCAINE-PRILOCAINE 2.5-2.5 % EX CREA
1.0000 "application " | TOPICAL_CREAM | CUTANEOUS | Status: DC | PRN
  Filled 2018-04-11: qty 5

## 2018-04-11 MED ORDER — HEPARIN SODIUM (PORCINE) 1000 UNIT/ML DIALYSIS
20.0000 [IU]/kg | INTRAMUSCULAR | Status: DC | PRN
  Filled 2018-04-11: qty 2

## 2018-04-11 MED ORDER — LABETALOL HCL 5 MG/ML IV SOLN
10.0000 mg | INTRAVENOUS | Status: AC | PRN
  Administered 2018-04-11 (×2): 10 mg via INTRAVENOUS
  Filled 2018-04-11 (×2): qty 4

## 2018-04-11 MED ORDER — SODIUM CHLORIDE 0.9 % IV SOLN: 100.0000 mL | INTRAVENOUS | Status: DC | PRN

## 2018-04-11 MED ORDER — LABETALOL HCL 5 MG/ML IV SOLN
5.0000 mg | Freq: Once | INTRAVENOUS | Status: AC
  Administered 2018-04-11: 5 mg via INTRAVENOUS
  Filled 2018-04-11: qty 4

## 2018-04-11 MED ORDER — HEPARIN SODIUM (PORCINE) 1000 UNIT/ML DIALYSIS
1000.0000 [IU] | INTRAMUSCULAR | Status: DC | PRN
  Filled 2018-04-11: qty 1

## 2018-04-11 MED ORDER — LIDOCAINE HCL (PF) 1 % IJ SOLN: 5.0000 mL | INTRAMUSCULAR | Status: DC | PRN

## 2018-04-11 MED ORDER — LORAZEPAM 2 MG/ML IJ SOLN
INTRAMUSCULAR | Status: AC
  Filled 2018-04-11: qty 1

## 2018-04-11 MED ORDER — SODIUM CHLORIDE 0.9 % IV SOLN
75.0000 mg | Freq: Two times a day (BID) | INTRAVENOUS | Status: AC
  Administered 2018-04-11 – 2018-04-13 (×5): 75 mg via INTRAVENOUS
  Filled 2018-04-11 (×7): qty 7.5

## 2018-04-11 MED ORDER — PENTAFLUOROPROP-TETRAFLUOROETH EX AERO: 1.0000 "application " | INHALATION_SPRAY | CUTANEOUS | Status: DC | PRN

## 2018-04-11 NOTE — Progress Notes (Signed)
Pt in HD since about 0645, family in room.

## 2018-04-11 NOTE — Progress Notes (Addendum)
[facility] ROUNDING NOTE   Subjective:   Interval History: Feeling better. Less short of breath but still with orthopnea. Nausea and vomiting resolved.   Objective:  Vital signs in last 24 hours:  Temp:  [98.6 F (37 C)-99 F (37.2 C)] 98.7 F (37.1 C) (07/22 0702) Pulse Rate:  [77-92] 89 (07/22 1000) Resp:  [20-30] 20 (07/21 2300) BP: (122-224)/(69-121) 194/113 (07/22 1000) SpO2:  [99 %-100 %] 100 % (07/22 0702) Weight:  [182 lb 5.1 oz (82.7 kg)-183 lb 10.3 oz (83.3 kg)] 182 lb 5.1 oz (82.7 kg) (07/22 0702)  Weight change: 12 lb 10.3 oz (5.735 kg) Filed Weights   04/10/18 0529 04/10/18 1112 04/11/18 0702  Weight: 171 lb (77.6 kg) 183 lb 10.3 oz (83.3 kg) 182 lb 5.1 oz (82.7 kg)    Intake/Output: I/O last 3 completed shifts: In: 240 [P.O.:240] Out: -    Intake/Output this shift:  No intake/output data recorded.  General - well-developed, well-nourished male, laying in bed in NAD  CVS- RRR, no mrg  RS- decreased breath sounds at L base, mild bibasilar crackles  ABD- BS present soft non-distended EXT- no edema bilaterally    Basic Metabolic Panel: Recent Labs  Lab 04/08/18 1135 04/10/18 0552 04/11/18 0713  NA 136 135 135  K 3.7 3.1* 3.5  CL 101 99 100  CO2 21* 22 21*  GLUCOSE 103* 101* 96  BUN 103* 77* 87*  CREATININE 12.13* 10.83* 12.17*  CALCIUM 8.7* 8.6* 8.2*  PHOS  --   --  3.6    Liver Function Tests: Recent Labs  Lab 04/11/18 0713  ALBUMIN 2.3*   No results for input(s): LIPASE, AMYLASE in the last 168 hours. No results for input(s): AMMONIA in the last 168 hours.  CBC: Recent Labs  Lab 04/08/18 1135 04/10/18 0552 04/11/18 0247  WBC 8.9 8.8 8.6  HGB 9.5* 9.7* 9.0*  HCT 27.6* 28.7* 26.4*  MCV 87.9 90.3 89.2  PLT 152 202 187    Cardiac Enzymes: No results for input(s): CKTOTAL, CKMB, CKMBINDEX, TROPONINI in the last 168 hours.  BNP: Invalid input(s): POCBNP  CBG: No results for input(s): GLUCAP in the last 168  hours.  Microbiology: Results for orders placed or performed during the hospital encounter of 03/31/18  MRSA PCR Screening     Status: None   Collection Time: 03/31/18  9:49 PM  Result Value Ref Range Status   MRSA by PCR NEGATIVE NEGATIVE Final    Comment:        The GeneXpert MRSA Assay (FDA approved for NASAL specimens only), is one component of a comprehensive MRSA colonization surveillance program. It is not intended to diagnose MRSA infection nor to guide or monitor treatment for MRSA infections. Performed at [facility], [place] [address] [place], [place] [address]     Coagulation Studies: No results for input(s): LABPROT, INR in the last 72 hours.  Urinalysis: No results for input(s): COLORURINE, LABSPEC, PHURINE, GLUCOSEU, HGBUR, BILIRUBINUR, KETONESUR, PROTEINUR, UROBILINOGEN, NITRITE, LEUKOCYTESUR in the last 72 hours.  Invalid input(s): APPERANCEUR    Imaging: Dg Chest 2 View  Result Date: 04/10/2018 CLINICAL DATA:  Shortness of breath this morning. Patient does home hemodialysis. EXAM: CHEST - 2 VIEW COMPARISON:  03/07/2018 FINDINGS: Lungs are adequately inflated demonstrate development of a moderate size left pleural effusion likely associated atelectasis. Hazy density over the right base with prominence of the perihilar markings bilaterally likely mild interstitial edema. Infection in the lung bases is possible. Mild stable  cardiomegaly. Remainder of the exam is unchanged. IMPRESSION: Development of a moderate size left pleural effusion likely with associated basilar atelectasis. Stable cardiomegaly with mild interstitial edema. Infection in the lung bases is possible. Electronically Signed   By: Marin Olp M.D.   On: 04/10/2018 07:15     Medications:   . sodium chloride    . sodium chloride     . bictegravir-emtricitabine-tenofovir AF  1 tablet Oral Daily  . calcium acetate  667-2,001 mg Oral TID WC  . carvedilol  25 mg Oral BID WC  .  Chlorhexidine Gluconate Cloth  6 each Topical Q0600  . cloNIDine  0.1 mg Transdermal Weekly  . heparin  5,000 Units Subcutaneous Q8H  . hydrALAZINE  75 mg Oral Q8H  . megestrol  40 mg Oral Daily  . multivitamin  1 tablet Oral Daily   sodium chloride, sodium chloride, acetaminophen **OR** acetaminophen, albuterol, alteplase, calcium acetate, heparin, heparin, lidocaine (PF), lidocaine-prilocaine, pentafluoroprop-tetrafluoroeth, traZODone  Assessment/ Plan:  Pt is a 62 y.o. yo male with a PMHX of ESRD 2/2 HTN and NSAID use on home HD 4x/week, HTN, well-controlled HIV, recent NSTEMI, seizures, hyperthyroidism, asthma, and duodenal mass who was recently admitted for acute on chronic anemia in the setting of GI bleed after ERCP for duodenal mass biopsy now presenting with shortness of breath at rest, orthopnea, PND that started yesterday.   ESRD on HD presenting with V overload - Seen during HD today. Respiratory status improved, but continues to complain of orthopnea. On 2L of supplemental oxygen but no documented hypoxia. HD today. Will need adjustment of dry weight in the setting of recent weight loss.  HTN - BP remains elevated. On Coreg, clonidine, and Hydralazine. HD will likely help with BP.  Volume- Remains hypervolemic. HD today with plan to remove 4L.  Anemia: at baseline  HIV: on Biktarvy   Patient was seen on dialysis  Tolerating fine  No issues today   He is stable from renal standpoint to discharge and follow up with primary nephrology team  ( he was discharged from [facility])     LOS: 1 Idalys Santos-Sanchez 04/11/2018 10:41 AM

## 2018-04-11 NOTE — Significant Event (Signed)
Rapid Response Event Note  Overview: Time Called: 9373 Arrival Time: 1316 Event Type: Other (Comment), Neurologic, Respiratory(code blue called )  Initial Focused Assessment: Code blue called, cancelled on arrival. Pt lying supine in bed, 84% on NRB, guppy breathing, using accessory muscles, breath sounds diminished on Left side. After several minutes pts breathing returned to normal, weaned him to 2L [place] remaining 100%. Pt would shake his head yes and/or no to questions. Pt has a history of seizures, Per brother pt had a lengthy grand-mal seizure in June. Dr. Jari Favre remains at bedside. New orders placed and completed by RN, Sharyn Lull.     Plan of Care (if not transferred): Continue to monitor, Sharyn Lull RN to call RRT as needed Event Summary: Name of Physician Notified: Dr. Jari Favre  at 1320    at    Outcome: Stayed in room and stabalized     [place], [place]

## 2018-04-11 NOTE — Progress Notes (Signed)
Pts BP 141/107 on the dinamap. BP 175/92 on manual recheck. IMTS on-call provider notified of no PRNs available at this time. Pt scheduled for HD in AM.  Will continue to monitor. Clint Bolder, RN 04/11/18 12:25 AM

## 2018-04-11 NOTE — Plan of Care (Signed)
  Problem: Education: Goal: Knowledge of General Education information will improve Description Including pain rating scale, medication(s)/side effects and non-pharmacologic comfort measures Outcome: Progressing  Pt was educated on POC for tonight. Pt was started on vimpat for seizure prophylaxis. Pt had an MRI earlier in the day and a head CT ordered for tonight. Pt and pts family verbalized understanding.

## 2018-04-11 NOTE — Progress Notes (Signed)
   Subjective: Jeffrey [name] was seen and evaluated on hemodialysis this morning. He states his shortness of breath has improved since yesterday. He had one episode of non-bloody emesis yesterday but has not had any further n/v since.  Notes his abdominal and lower extremity swelling have decreased. He denies any headaches, chest pain, palpitations, abdominal pain or changes in his BMs.   Objective:  Vital signs in last 24 hours: Vitals:   04/11/18 1030 04/11/18 1100 04/11/18 1130 04/11/18 1153  BP: (!) 138/111 (!) 182/99 (!) 178/101 (!) 198/74  Pulse: 93 86 90 83  Resp:    (!) 23  Temp:    98.7 F (37.1 C)  TempSrc:    Oral  SpO2:    100%  Weight:    172 lb 2.9 oz (78.1 kg)  Height:       Physical exam: General: awake, alert pleasant male lying in bed in NAD Cardiovascular: RRR. SEM at LUSB.  Respiratory: no increased work of breathing. Lungs clear to auscultation anteriorly; not able to auscultate posterior fields due to being on HD Abdominal: BS+ Ext: trace lower extremity edema, improved since yesterday  Assessment/Plan:  Active Problems:   Dyspnea  1. Dyspnea 2/2 volume overload in the setting ESRD - patient's dry weight is 76 kg; 83.3 kg on admission - patient reports improvement in symptoms at HD session this morning. - will follow-up with nephrology's recommendations post dialysis; likely will be reducing his dry weight in the setting of recent weight loss. - continues to saturate well on room air  2. Aortic insufficiency - last echo was 02/2017 which showed moderate aortic valve regurgitation; EF 65-70% - may be playing a role in volume status; will consider repeat echo if patient's symptoms do not improve after dialysis.   3. HTN Recent NSTEMI 2/2 hypertensive emergency at [facility] admission on 6/24.   - remains asymptomatic; improved after getting all of his home meds  - will likely continue to improve post dialysis.  - aspirin being held until follow-up appt with GI  at [facility] in the setting of recent upper GI bleed   4. HIV - well controlled based on last CD4 count; continue home antivirals    Dispo: Anticipated discharge in approximately 1-2 day(s).   Modena Nunnery D, DO 04/11/2018, 12:38 PM Pager: [phone]

## 2018-04-11 NOTE — Progress Notes (Addendum)
Called by rapid response stating patient had seizure and had dyspnea afterwards.  Per patient's nurse, patient came from HD and had some nausea but no vomiting followed by ~60sec gen tonic clonic seizure which started out as focal leftward head movement. He had bowel incontinence. Seizure resolved w/o ativan administration. Afterwards, patient was noted to have significant dyspnea and was hypoxic to 80s on non re-breather.   On my arrival to bedside ABG had just resulted to 7.33/41/38/21.8 and patient was breathing without increased effort and was being transitioned back to [place]. CXR personally reviewed shows no consolidation or infiltrate.   SBP to 205; patient has been hypertensive for past 2 days. Patient opens eyes to mild sternal rub, nods to being asked to squeeze hand but does not perform the action. No facial asymmetry, PERRL, gaze moves away from light bilaterally. Heart sounds wnl, breathing comfortably; anterior upper lung field rhonchi but from what could be assessed post lungs sound clear.  About 48mins later, alerted by brother that he was more alert. Patient nodded that he felt bad. Shook head no to chest pain, headache, abd pain, dyspnea, or focal weakness. Sensation intact, generalized weakness, unable to speak clearly or fully follow commands. I was able to titrate him off of [place].   Patient has h/o seizure in setting of uremia in June; was initially placed on keppra, however this was discontinued due to thrombocytopenia. In setting of likely uremia causing seizure he was not restarted on an antiepileptic.   Plan: --neuro consult --started vimpat 75mg  BID (ESRD dosing) due to ?thrombocytopenia with keppra; contraindication to dilantin with HIV meds. --labetalol 5mg  IV --CT head stat; MRI brain stat in setting of hypertension would worry about PRESS --stat CMet and CBC; electrolytes might have shifted during HD; ABG reassuring against significant acidosis as cause --stepdown  status  Alphonzo Grieve, MD IMTS - PGY3 Pager [phone]

## 2018-04-11 NOTE — Care Management Note (Addendum)
Case Management Note  Patient Details  Name: Jeffrey [name] MRN: [id number] Date of Birth: 09-Feb-1956  Subjective/Objective:    From home with brother, presents with hx of 042, dyspnea secondary to volume overload, aortic insuff, he is a HD patient.  He is active with [facility] for [place], will resume at discharge.    7/24 Tomi Bamberger RN, BSN - NCM faxed resumption orders for HHPT to [facility] [phone] and notified Robin at [phone].   7/26 Tomi Bamberger RN, BSN - NCM notified Shirlean Mylar with [facility] that patient is being dc today.             Action/Plan: DC home with resumption of HHPT when ready.   Expected Discharge Date:                  Expected Discharge Plan:  [place]  In-House Referral:     Discharge planning Services  CM Consult  Post Acute Care Choice:  Resumption of Svcs/PTA Provider Choice offered to:     DME Arranged:    DME Agency:     HH Arranged:    [place] Agency:     Status of Service:  In process, will continue to follow  If discussed at Long Length of Stay Meetings, dates discussed:    Additional Comments:  Zenon Mayo, RN 04/11/2018, 8:56 AM

## 2018-04-11 NOTE — Progress Notes (Signed)
Internal Medicine Clinic Attending  I saw and evaluated the patient.  I personally confirmed the key portions of the history and exam documented by Dr. Lee and I reviewed pertinent patient test results.  The assessment, diagnosis, and plan were formulated together and I agree with the documentation in the resident's note.  

## 2018-04-11 NOTE — Progress Notes (Signed)
  Date: 04/11/2018  Patient name: Jeffrey [name]  Medical record number: [id number]  Date of birth: 1955/12/17   I have seen and evaluated Jeffrey [name] and discussed their care with the Residency Team. Jeffrey [name] is a 62 yo man with ESRD on home HD who presented 7/21 with dyspnea which has worsened over the past few days.  He also endorses orthopnea, PND, and intermittent chest pain which felt like he could not catch his breath.  On admission, he was felt to have volume overload and hemodialysis was arranged for this morning.  Nephrology anticipates that they will reduce his dry weight due to recent weight loss.  His weight on discharge was 171 and yesterday on admission, his weight was recorded as 183.  This morning, he is in dialysis and feels that his breathing is better but not yet back to baseline.  He has noticed no further bleeding and has no other complaints today.  PMHx, Fam Hx, and/or Soc Hx : HIV, HTN, asthma, NSTEMI, duodenal mass s/p ERCP at [facility], recent admission 03/2018 for UGI bleed and refractory anemia.  He lives with his brother in their home.   Exam : afebrile, HR 82 - 93, BP 182/99, 100% on RA. No accessory muscle usage, resp distress, able to speak in full sentences. On HD so not able to auscultate posteriorly. No LE edema  K 3.5 CO2 21 BNP 2200 Trop I 0.07 HgB 9.0 (stable)  I personally viewed the CXR images and confirmed my reading with the official read. 2 view, PA and lateral, slight rotation, increased markings R base, L effusion, one kerley B, all changes likely 2/2 vol overload  I personally viewed the EKG and confirmed my reading with the official read. Sinus, LAD, 1st degree AV block, no ischemic changes  Assessment and Plan: I have seen and evaluated the patient as outlined above. I agree with the formulated Assessment and Plan as detailed in the residents' note, with the following changes: Jeffrey [name] is a 62 yo man with ESRD on home HD who presented 7/21 with dyspnea  which has worsened over the past few days.  He states he is compliant with home hemodialysis and fluid restrictions.  His chest x-ray is consistent with volume overload but no other cause for his symptoms. His weight is up 12 pounds from his last discharge on July 15.  This is likely volume overload secondary to lack of adequate hemodialysis at home, either in frequency, volume removal, or an accurate dry weight.  He is being diuresed currently and his symptoms are starting to resolve.  We will follow-up with nephrology's plan for further inpatient dialysis.  1.  Volume overload -he is already seen improvement in his symptoms with his hemodialysis session today.  We will follow-up post dialysis and with nephrology's recommendations.  Nephrology may be reducing his dry weight due to his recent weight loss.  2.  Uncontrolled hypertension -he is on his home doses of clonidine, hydralazine, and carvedilol.  Fluid removal will also help with his hypertension.  I anticipate discharge in the next day or 2, once he is euvolemic and we have a new dry weight.  Jeffrey Crews, MD 7/22/201911:01 AM

## 2018-04-11 NOTE — Progress Notes (Signed)
Pt. Returned from dialysis with a bad HA. He was especially weak. Systolic BP numbers had been high throughout dialysis per the dialysis nurse who reported pt's last systolic BP was 977. BP and other overdue medications were given at that time. His BPs continued to remain high. While RN was replacing leads, pt become lethargic, nauseous, and began twitching. Pt quickly became unresponsive and began seizing. RN then called a Rapid Response. RT, MD, and RRT to bedside. New orders received, med's given and pt stabilized. Pt's brother at bedside updated on condition and POC. Pt. changed to step-down status, bedside monitor applied. Awaiting labs, CT and MRI., will continue to monitor.

## 2018-04-11 NOTE — Progress Notes (Signed)
Came to room d/t code blue call, found pt w/ increased WOB/agonal appearing respirations and decreased air movement/breath sounds t/o w/ crackles.  Pt immediately placed on NRB, bipap/vent being obtained, crash cart/ambu bag being obtained.  ABG done, during that time pt respirations started improving.   Pt now starting to wake up, improving aeration t/o lung fields.  Pt starting to wake up.  Sat 100% on NRB, pao2 > 300.  Fio2 weaned to 4 lpm [place], sat 100%.  Floor RT aware.  Bipap/vent on stand by currently. MD at bedside. No new RT orders currently noted.

## 2018-04-12 DIAGNOSIS — G40409 Other generalized epilepsy and epileptic syndromes, not intractable, without status epilepticus: Secondary | ICD-10-CM

## 2018-04-12 LAB — CBC
HEMATOCRIT: 26.1 % — AB (ref 39.0–52.0)
Hemoglobin: 8.7 g/dL — ABNORMAL LOW (ref 13.0–17.0)
MCH: 30.3 pg (ref 26.0–34.0)
MCHC: 33.3 g/dL (ref 30.0–36.0)
MCV: 90.9 fL (ref 78.0–100.0)
Platelets: 171 10*3/uL (ref 150–400)
RBC: 2.87 MIL/uL — ABNORMAL LOW (ref 4.22–5.81)
RDW: 16.9 % — ABNORMAL HIGH (ref 11.5–15.5)
WBC: 7.3 10*3/uL (ref 4.0–10.5)

## 2018-04-12 LAB — RENAL FUNCTION PANEL
ALBUMIN: 2.2 g/dL — AB (ref 3.5–5.0)
ANION GAP: 10 (ref 5–15)
BUN: 33 mg/dL — ABNORMAL HIGH (ref 8–23)
CHLORIDE: 98 mmol/L (ref 98–111)
CO2: 29 mmol/L (ref 22–32)
Calcium: 7.9 mg/dL — ABNORMAL LOW (ref 8.9–10.3)
Creatinine, Ser: 7.19 mg/dL — ABNORMAL HIGH (ref 0.61–1.24)
GFR calc Af Amer: 8 mL/min — ABNORMAL LOW (ref >60)
GFR calc non Af Amer: 7 mL/min — ABNORMAL LOW (ref >60)
GLUCOSE: 93 mg/dL (ref 70–99)
PHOSPHORUS: 2.5 mg/dL (ref 2.5–4.6)
POTASSIUM: 3.4 mmol/L — AB (ref 3.5–5.1)
Sodium: 137 mmol/L (ref 135–145)

## 2018-04-12 LAB — HEPATITIS B SURFACE ANTIBODY,QUALITATIVE: HEP B S AB: NONREACTIVE

## 2018-04-12 LAB — HEPATITIS B SURFACE ANTIGEN

## 2018-04-12 LAB — HEPATITIS B SURFACE AG, CONFIRM: HBsAg Confirmation: POSITIVE — AB

## 2018-04-12 LAB — HEPATITIS B CORE ANTIBODY, TOTAL: Hep B Core Total Ab: POSITIVE — AB

## 2018-04-12 MED ORDER — RENA-VITE PO TABS
1.0000 | ORAL_TABLET | Freq: Every day | ORAL | Status: DC
  Administered 2018-04-12 – 2018-04-14 (×3): 1 via ORAL
  Filled 2018-04-12 (×3): qty 1

## 2018-04-12 MED ORDER — HYDRALAZINE HCL 50 MG PO TABS
100.0000 mg | ORAL_TABLET | Freq: Three times a day (TID) | ORAL | Status: DC
  Administered 2018-04-12 – 2018-04-15 (×9): 100 mg via ORAL
  Filled 2018-04-12 (×9): qty 2

## 2018-04-12 MED ORDER — HYDRALAZINE HCL 25 MG PO TABS
25.0000 mg | ORAL_TABLET | Freq: Once | ORAL | Status: AC
  Administered 2018-04-12: 25 mg via ORAL
  Filled 2018-04-12: qty 1

## 2018-04-12 NOTE — Progress Notes (Addendum)
[facility] ROUNDING NOTE   Subjective:   Interval History: Seizure yesterday and started on Vimpat. Respiratory status better after HD yesterday. Feeling weak.   Objective:  Vital signs in last 24 hours:  Temp:  [98.6 F (37 C)-99.9 F (37.7 C)] 98.6 F (37 C) (07/23 0819) Pulse Rate:  [66-92] 92 (07/23 0819) Resp:  [20-32] 32 (07/23 0819) BP: (159-233)/(63-104) 177/88 (07/23 0935) SpO2:  [97 %-100 %] 97 % (07/23 0819) Weight:  [172 lb 2.9 oz (78.1 kg)] 172 lb 2.9 oz (78.1 kg) (07/22 1153)  Weight change: -1 lb 5.2 oz (-0.6 kg) Filed Weights   04/10/18 1112 04/11/18 0702 04/11/18 1153  Weight: 183 lb 10.3 oz (83.3 kg) 182 lb 5.1 oz (82.7 kg) 172 lb 2.9 oz (78.1 kg)    Intake/Output: I/O last 3 completed shifts: In: 290 [P.O.:240; IV Piggyback:50] Out: 4000 [Other:4000]   Intake/Output this shift:  No intake/output data recorded.  General - well-developed, well-nourished male, laying in bed in NAD  CVS- RRR, no mrg  RS- decreased breath sounds at L base, mild bibasilar crackles  ABD- BS present soft non-distended EXT- no edema bilaterally    Basic Metabolic Panel: Recent Labs  Lab 04/08/18 1135 04/10/18 0552 04/11/18 0713 04/11/18 1410 04/12/18 0208  NA 136 135 135 137 137  K 3.7 3.1* 3.5 3.5 3.4*  CL 101 99 100 96* 98  CO2 21* 22 21* 25 29  GLUCOSE 103* 101* 96 120* 93  BUN 103* 77* 87* 30* 33*  CREATININE 12.13* 10.83* 12.17* 6.02* 7.19*  CALCIUM 8.7* 8.6* 8.2* 7.9* 7.9*  PHOS  --   --  3.6  --  2.5    Liver Function Tests: Recent Labs  Lab 04/11/18 0713 04/11/18 1410 04/12/18 0208  AST  --  18  --   ALT  --  9  --   ALKPHOS  --  67  --   BILITOT  --  0.5  --   PROT  --  5.8*  --   ALBUMIN 2.3* 2.5* 2.2*   No results for input(s): LIPASE, AMYLASE in the last 168 hours. No results for input(s): AMMONIA in the last 168 hours.  CBC: Recent Labs  Lab 04/08/18 1135 04/10/18 0552 04/11/18 0247 04/11/18 1410 04/12/18 0208   WBC 8.9 8.8 8.6 14.6* 7.3  HGB 9.5* 9.7* 9.0* 9.8* 8.7*  HCT 27.6* 28.7* 26.4* 29.2* 26.1*  MCV 87.9 90.3 89.2 89.6 90.9  PLT 152 202 187 212 171    Cardiac Enzymes: No results for input(s): CKTOTAL, CKMB, CKMBINDEX, TROPONINI in the last 168 hours.  BNP: Invalid input(s): POCBNP  CBG: No results for input(s): GLUCAP in the last 168 hours.  Microbiology: Results for orders placed or performed during the hospital encounter of 03/31/18  MRSA PCR Screening     Status: None   Collection Time: 03/31/18  9:49 PM  Result Value Ref Range Status   MRSA by PCR NEGATIVE NEGATIVE Final    Comment:        The GeneXpert MRSA Assay (FDA approved for NASAL specimens only), is one component of a comprehensive MRSA colonization surveillance program. It is not intended to diagnose MRSA infection nor to guide or monitor treatment for MRSA infections. Performed at [facility], [place] [address], [place], [place] [address]     Coagulation Studies: No results for input(s): LABPROT, INR in the last 72 hours.  Urinalysis: No results for input(s): COLORURINE, LABSPEC, [place], [place], [place], BILIRUBINUR, KETONESUR,  PROTEINUR, UROBILINOGEN, NITRITE, LEUKOCYTESUR in the last 72 hours.  Invalid input(s): APPERANCEUR    Imaging: Ct Head Wo Contrast  Result Date: 04/11/2018 CLINICAL DATA:  New onset seizure EXAM: CT HEAD WITHOUT CONTRAST TECHNIQUE: Contiguous axial images were obtained from the base of the skull through the vertex without intravenous contrast. COMPARISON:  Brain MRI 04/11/2018, CT brain 02/21/2018 FINDINGS: Brain: No acute territorial infarction, hemorrhage or intracranial mass. Moderate small vessel ischemic changes of the white matter. Stable ventricle size Vascular: No hyperdense vessels. Scattered carotid vascular calcification. Skull: Normal. Negative for fracture or focal lesion. Sinuses/Orbits: No acute finding. Other: None IMPRESSION: 1. No CT evidence for acute  intracranial abnormality. 2. Moderate small vessel ischemic changes of the white matter Electronically Signed   By: Donavan Foil M.D.   On: 04/11/2018 22:33   Mr Brain Wo Contrast  Result Date: 04/11/2018 CLINICAL DATA:  New onset seizures. History of end-stage renal disease on dialysis, HIV, hepatitis B. EXAM: MRI HEAD WITHOUT CONTRAST TECHNIQUE: Multiplanar, multiecho pulse sequences of the brain and surrounding structures were obtained without intravenous contrast. COMPARISON:  MRI of the head February 23, 2018. FINDINGS: Multiple sequences are mildly motion degraded. INTRACRANIAL CONTENTS: No reduced diffusion to suggest acute ischemia. Numerous similar chronic microhemorrhages most compatible with chronic hypertension. Moderate parenchymal brain volume loss. No suspicious parenchymal signal, masses, mass effect. Confluent supratentorial white matter FLAIR T2 hyperintensities. No abnormal extra-axial fluid collections. No extra-axial masses. Hippocampi demonstrate generally symmetric morphology and signal, limited by motion degraded in size coronal T2. VASCULAR: Normal major intracranial vascular flow voids present at skull base. SKULL AND UPPER CERVICAL SPINE: No abnormal sellar expansion. No suspicious calvarial bone marrow signal. Craniocervical junction maintained. SINUSES/OR RIGHT mastoid effusion. BITS: The mastoid air-cells and included paranasal sinuses are well-aerated.The included ocular globes and orbital contents are non-suspicious. OTHER: Patient is edentulous. IMPRESSION: 1. No acute intracranial process. 2. Stable moderate parenchymal brain volume loss. 3. Stable moderate to severe chronic small vessel ischemic changes. Electronically Signed   By: Elon Alas M.D.   On: 04/11/2018 18:34   Dg Chest Port 1 View  Result Date: 04/11/2018 CLINICAL DATA:  Acute respiratory distress. EXAM: PORTABLE CHEST 1 VIEW COMPARISON:  Chest x-ray from yesterday. FINDINGS: Stable cardiomegaly. Unchanged  small to moderate left pleural effusion with left lower lobe atelectasis versus infiltrate. Slightly improved interstitial markings and hazy density at the right lung base. No pneumothorax. No acute osseous abnormality. IMPRESSION: 1. Unchanged small to moderate left pleural effusion with adjacent left lower lobe atelectasis versus infiltrate. 2. Improving interstitial edema. Electronically Signed   By: Titus Dubin M.D.   On: 04/11/2018 13:35     Medications:   . lacosamide (VIMPAT) IV 75 mg (04/12/18 0941)   . bictegravir-emtricitabine-tenofovir AF  1 tablet Oral Daily  . calcium acetate  667-2,001 mg Oral TID WC  . carvedilol  25 mg Oral BID WC  . Chlorhexidine Gluconate Cloth  6 each Topical Q0600  . cloNIDine  0.1 mg Transdermal Weekly  . heparin  5,000 Units Subcutaneous Q8H  . hydrALAZINE  100 mg Oral Q8H  . megestrol  40 mg Oral Daily  . multivitamin  1 tablet Oral QHS   acetaminophen **OR** acetaminophen, albuterol, calcium acetate, traZODone  Assessment/ Plan:  Pt is a 62 y.o. yo male with a PMHX of ESRD 2/2 HTN and NSAID use on home HD 4x/week, HTN, well-controlled HIV, recent NSTEMI, seizures, hyperthyroidism, asthma, and duodenal mass who was recently admitted for acute on  chronic anemia in the setting of GI bleed after ERCP for duodenal mass biopsy now presenting with shortness of breath at rest, orthopnea, PND that started yesterday.   ESRD on HD presenting with V overload - HD yesterday with UF 4L. Respiratory status improved, orthopnea resolved. Remains on 2L of O2. Plan for HD tomorrow, may need to decrease UF.  MWF schedule HTN - BP remains elevated. On Coreg, clonidine, and Hydralazine. Increased hydralazine to 100 mg TID.  Numerous intolerances to medications Volume- Improving with HD. Plan for HD tomorrow.   Anemia: at baseline  HIV: on [place] Seizure: states has had 3 in the past, most recent on in June 2019 in the setting of uremia. Started on Vimpat (Keppra  d/c due to thrombocytopenia and dilantin interacts with HIV meds). Brain imaging with chronic small vessel disease but no acute intracranial process. Neurology following.    Appreciate neurology    LOS: 2 Idalys Santos-Sanchez 04/11/2018 11:41 AM

## 2018-04-12 NOTE — Progress Notes (Signed)
   Subjective: [name] was seen and evaluated at bedside this morning. Yesterday afternoon after returning from HD, patient had a generalized tonic/clonic seizure lasting approximately 60 seconds. He has not had any seizure activity since, and patient does not recall anything other than feeling nauseated and having a headache once he returned from HD. Today he feels well. His breathing has improved, and he does not feel swollen in his legs or abdomen anymore. He denies any headache, chest pain, palpitations, n/v, melena or hematochezia.   We discussed he will not be able to drive for 6 months with his seizure yesterday.   Objective:  Vital signs in last 24 hours: Vitals:   04/12/18 0707 04/12/18 0819 04/12/18 0935 04/12/18 1433  BP: (!) 185/96 (!) 169/91 (!) 177/88 (!) 165/78  Pulse:  92    Resp:  (!) 32    Temp:  98.6 F (37 C)    TempSrc:  Oral    SpO2:  97%    Weight:      Height:       Physical Exam: General: awake, alert pleasant male lying in bed in NAD Cardiovascular: RRR. SEM at LUSB Respiratory: no increased work of breathing. Mild crackles at bases.  Abdominal: BS+. Abdomen is soft, non-tender, non-distended Ext: no lower extremity edema   Assessment/Plan:  Active Problems:   Dyspnea  1. Dyspnea 2/2 volume overload in the setting ESRD - patient's dry weight is 76 kg; 83.3 kg on admission - patient reports improvement in breathing since HD yesterday  - nephrology plans to do another HD session tomorrow; will likely decrease patient's dry weight with recent weight loss - saturating well on room air  2. Seizure - generalized, tonic-clonic after returning from HD yesterday - ABG revealed no acidosis; imaging ruled out any acute intracranial process; etiology may have been large volume shift during HD; nephrology will likely decreased UF tomorrow. - has had 3 seizures in the past, most recently in 02/2018 in the setting of uremia.  - placed on Vimpat (Keppra d/c due  to thromboyctopenia and dilantin interacts with HIV meds); will follow-up with outpatient neurology to adjust dosing in the setting of ESRD as needed. Patient was advised not to drive for 6 months.   3. HTN Recent NSTEMI 2/2 hypertensive emergency at [facility] admission on 6/24. - PRES was ruled out after seizure; remains hypertensive but asymptomatic; continue home meds and PRN labetalol  - nephrology has increased hydralazine to 100 tid. Appreciate their input.   - aspirin being held until follow-up appt with GI at [facility] in the setting of recent upper GI bleed   4. HIV - well controlled based on last CD4 count; continue home antivirals  Dispo: Anticipated discharge in approximately 1-2 day(s).   Modena Nunnery D, DO 04/12/2018, 4:04 PM Pager: [phone]

## 2018-04-12 NOTE — Progress Notes (Signed)
  Date: 04/12/2018  Patient name: Jeffrey [name]  Medical record number: [id number]  Date of birth: 12/07/1955   I have seen and evaluated this patient and I have discussed the plan of care with the house staff. Please see their note for complete details. I concur with their findings with the following additions/corrections: Events of yesterday were discussed with the team.  Patient is now on antiepileptic medications without further events after yesterday's seizure.  He understands he is not to drive and will be following up with outpatient neurology.  He was concerned that he had 4 L removed during dialysis yesterday and felt that was too much.  Weight has not been recorded today.  Nephrology is planning another hemodialysis session tomorrow and then likely will be able to be discharged to home.  Jeffrey Crews, MD 04/12/2018, 2:32 PM: Actually.  Patient for

## 2018-04-13 LAB — RENAL FUNCTION PANEL
Albumin: 2.2 g/dL — ABNORMAL LOW (ref 3.5–5.0)
Anion gap: 9 (ref 5–15)
BUN: 50 mg/dL — ABNORMAL HIGH (ref 8–23)
CALCIUM: 7.7 mg/dL — AB (ref 8.9–10.3)
CO2: 28 mmol/L (ref 22–32)
CREATININE: 9.19 mg/dL — AB (ref 0.61–1.24)
Chloride: 103 mmol/L (ref 98–111)
GFR calc Af Amer: 6 mL/min — ABNORMAL LOW (ref >60)
GFR calc non Af Amer: 5 mL/min — ABNORMAL LOW (ref >60)
GLUCOSE: 106 mg/dL — AB (ref 70–99)
Phosphorus: 3.9 mg/dL (ref 2.5–4.6)
Potassium: 3.4 mmol/L — ABNORMAL LOW (ref 3.5–5.1)
SODIUM: 140 mmol/L (ref 135–145)

## 2018-04-13 LAB — CBC
HCT: 26.2 % — ABNORMAL LOW (ref 39.0–52.0)
HEMOGLOBIN: 8.6 g/dL — AB (ref 13.0–17.0)
MCH: 30.4 pg (ref 26.0–34.0)
MCHC: 32.8 g/dL (ref 30.0–36.0)
MCV: 92.6 fL (ref 78.0–100.0)
PLATELETS: 178 10*3/uL (ref 150–400)
RBC: 2.83 MIL/uL — ABNORMAL LOW (ref 4.22–5.81)
RDW: 17.2 % — ABNORMAL HIGH (ref 11.5–15.5)
WBC: 8.9 10*3/uL (ref 4.0–10.5)

## 2018-04-13 MED ORDER — SODIUM CHLORIDE 0.9 % IV SOLN: 100.0000 mL | INTRAVENOUS | Status: DC | PRN

## 2018-04-13 MED ORDER — PENTAFLUOROPROP-TETRAFLUOROETH EX AERO: 1.0000 "application " | INHALATION_SPRAY | CUTANEOUS | Status: DC | PRN

## 2018-04-13 MED ORDER — LIDOCAINE-PRILOCAINE 2.5-2.5 % EX CREA
1.0000 "application " | TOPICAL_CREAM | CUTANEOUS | Status: DC | PRN
  Filled 2018-04-13: qty 5

## 2018-04-13 MED ORDER — CHLORHEXIDINE GLUCONATE CLOTH 2 % EX PADS: 6.0000 | MEDICATED_PAD | Freq: Every day | CUTANEOUS | Status: DC

## 2018-04-13 MED ORDER — ALBUTEROL SULFATE (2.5 MG/3ML) 0.083% IN NEBU
2.5000 mg | INHALATION_SOLUTION | Freq: Once | RESPIRATORY_TRACT | Status: AC | PRN
  Administered 2018-04-14: 2.5 mg via RESPIRATORY_TRACT
  Filled 2018-04-13: qty 3

## 2018-04-13 MED ORDER — LACOSAMIDE 50 MG PO TABS
75.0000 mg | ORAL_TABLET | Freq: Two times a day (BID) | ORAL | Status: DC
  Administered 2018-04-13 – 2018-04-15 (×4): 75 mg via ORAL
  Filled 2018-04-13 (×4): qty 2

## 2018-04-13 MED ORDER — HEPARIN SODIUM (PORCINE) 1000 UNIT/ML DIALYSIS
1000.0000 [IU] | INTRAMUSCULAR | Status: DC | PRN
  Filled 2018-04-13: qty 1

## 2018-04-13 MED ORDER — ALTEPLASE 2 MG IJ SOLR: 2.0000 mg | Freq: Once | INTRAMUSCULAR | Status: DC | PRN

## 2018-04-13 MED ORDER — LIDOCAINE HCL (PF) 1 % IJ SOLN: 5.0000 mL | INTRAMUSCULAR | Status: DC | PRN

## 2018-04-13 NOTE — Consult Note (Signed)
   [facility] CM Inpatient Consult   04/13/2018  Jeffrey [name] 08/11/1956 [id number]    Patient screened for potential [facility] Care Management services due to multiple hospitalizations and unplanned readmission risk score of 47% (extreme).  Went to bedside to speak with patient and brother. Patient was sleeping and brother [name] asked Probation officer to come back at a later time. [name] reports patient had [name] Management in the past and they will discuss if they want to re-engage with [name] Management again. Provided [facility] Care Management brochure with contact number and 24-hr nurse advice line magnet.  Will follow up at a later time per request.  Made inpatient [facility] aware of above notes.   [name], MSN-Ed, RN,BSN [facility] Liaison [phone]

## 2018-04-13 NOTE — Progress Notes (Signed)
   Subjective: Jeffrey [name] was seen and evaluated at bedside this morning. Brother, [name], was also at bedside. He reports he is feeling much better than when he came in. He states his breathing is improved and is almost back at baseline. Feels the swelling in his abdomen and legs has also gone down. He denies headaches, fevers, chills, chest pain, palpitations, n/v, abdominal pain, melena.  We discussed that nephrology plans on another dialysis session today and then he will be able to be discharged home.   Objective:  Vital signs in last 24 hours: Vitals:   04/13/18 0530 04/13/18 0600 04/13/18 0812 04/13/18 1127  BP: (!) 208/84 (!) 229/111 (!) 200/88 (!) 206/90  Pulse:   89 87  Resp:   (!) 22 (!) 21  Temp:   99.1 F (37.3 C) 99.6 F (37.6 C)  TempSrc:   Oral Oral  SpO2: 100% 100% 100% 100%  Weight:      Height:      Bed weight today is 78 kg (down from 83 kg on admission)  Physical Exam General: awake, alert tired appearing male lying in bed in NAD.  Cardiovascular: RRR; soft SEM at LUSB  Respiratory: no increased work of breathing; good air movement bilaterally; no crackles or wheezing appreciated Abdominal: BS+. Abdomen is soft, non-tender, non-distended Ext: Trace BLE edema   Assessment/Plan:  Active Problems:   Dyspnea  1. Dyspnea 2/2 volume overload in the setting ESRD - patient's dry weight is 76 kg;83.3 kg on admission; currently 78 kg today s/p 1 HD session -patient reports improvement in breathing since HD   - continues to saturate well on room air -nephrology plans on another HD session today and then can be discharged home - will likely decrease patient's dry weight with recent weight loss   2. Seizure - generalized, tonic-clonic after returning from HD 04/11/18 - ABG revealed no acidosis; imaging ruled out any acute intracranial process; etiology may have been fluid/electrolyte shift during HD; nephrology will dialyze at lower UF today - has had 3 seizures in  the past, most recently in 02/2018 in the setting of uremia.  - placed on Vimpat (Keppra d/c due to thromboyctopenia and dilantin interacts with HIV meds); will follow-up with outpatient neurology to adjust dosing in the setting of ESRD as needed. Patient was advised not to drive for 6 months.   3. HTN Recent NSTEMI 2/2 hypertensive emergency at [facility] admission on 6/24. - PRES was ruled out after seizure - remains persistently hypertensive but asymptomatic; expect to improve after another HD session today.  - nephrology increased hydralazine to 100 mg tid; appreciate their input - aspirin being held until follow-up appt with GI at [facility] in the setting of recent upper GI bleed   4. HIV - well controlled. Continue Biktarvy   Dispo: Anticipated discharge today after HD.   Modena Nunnery D, DO 04/13/2018, 11:44 AM Pager: [phone]

## 2018-04-13 NOTE — Progress Notes (Addendum)
[facility] ROUNDING NOTE   Subjective:   Interval History: no further seizure activity. Feeling well this AM.   Objective:  Vital signs in last 24 hours:  Temp:  [98.3 F (36.8 C)-99.6 F (37.6 C)] 99.1 F (37.3 C) (07/24 0812) Pulse Rate:  [81-89] 89 (07/24 0812) Resp:  [22-32] 22 (07/24 0812) BP: (165-229)/(72-191) 200/88 (07/24 0812) SpO2:  [97 %-100 %] 100 % (07/24 0812) Weight:  [179 lb 10.8 oz (81.5 kg)] 179 lb 10.8 oz (81.5 kg) (07/24 0500)  Weight change: -2 lb 10.3 oz (-1.2 kg) Filed Weights   04/11/18 0702 04/11/18 1153 04/13/18 0500  Weight: 182 lb 5.1 oz (82.7 kg) 172 lb 2.9 oz (78.1 kg) 179 lb 10.8 oz (81.5 kg)    Intake/Output: I/O last 3 completed shifts: In: 1370 [P.O.:1320; IV Piggyback:50] Out: -    Intake/Output this shift:  No intake/output data recorded.  General - well-developed, well-nourished male, laying in bed in NAD  CVS- RRR, no mrg  RS- decreased breath sounds at L base, mild bibasilar crackles, appears mildly dyspneic when talking  ABD- BS present soft non-distended EXT- trace edema bilaterally    Basic Metabolic Panel: Recent Labs  Lab 04/10/18 0552 04/11/18 0713 04/11/18 1410 04/12/18 0208 04/13/18 0329  NA 135 135 137 137 140  K 3.1* 3.5 3.5 3.4* 3.4*  CL 99 100 96* 98 103  CO2 22 21* 25 29 28   GLUCOSE 101* 96 120* 93 106*  BUN 77* 87* 30* 33* 50*  CREATININE 10.83* 12.17* 6.02* 7.19* 9.19*  CALCIUM 8.6* 8.2* 7.9* 7.9* 7.7*  PHOS  --  3.6  --  2.5 3.9    Liver Function Tests: Recent Labs  Lab 04/11/18 0713 04/11/18 1410 04/12/18 0208 04/13/18 0329  AST  --  18  --   --   ALT  --  9  --   --   ALKPHOS  --  67  --   --   BILITOT  --  0.5  --   --   PROT  --  5.8*  --   --   ALBUMIN 2.3* 2.5* 2.2* 2.2*   No results for input(s): LIPASE, AMYLASE in the last 168 hours. No results for input(s): AMMONIA in the last 168 hours.  CBC: Recent Labs  Lab 04/10/18 0552 04/11/18 0247 04/11/18 1410  04/12/18 0208 04/13/18 0329  WBC 8.8 8.6 14.6* 7.3 8.9  HGB 9.7* 9.0* 9.8* 8.7* 8.6*  HCT 28.7* 26.4* 29.2* 26.1* 26.2*  MCV 90.3 89.2 89.6 90.9 92.6  PLT 202 187 212 171 178    Cardiac Enzymes: No results for input(s): CKTOTAL, CKMB, CKMBINDEX, TROPONINI in the last 168 hours.  BNP: Invalid input(s): POCBNP  CBG: No results for input(s): GLUCAP in the last 168 hours.  Microbiology: Results for orders placed or performed during the hospital encounter of 03/31/18  MRSA PCR Screening     Status: None   Collection Time: 03/31/18  9:49 PM  Result Value Ref Range Status   MRSA by PCR NEGATIVE NEGATIVE Final    Comment:        The GeneXpert MRSA Assay (FDA approved for NASAL specimens only), is one component of a comprehensive MRSA colonization surveillance program. It is not intended to diagnose MRSA infection nor to guide or monitor treatment for MRSA infections. Performed at [facility], [place] [address], [place], [place] [address]     Coagulation Studies: No results for input(s): LABPROT, INR in the last  72 hours.  Urinalysis: No results for input(s): COLORURINE, LABSPEC, PHURINE, GLUCOSEU, HGBUR, BILIRUBINUR, KETONESUR, PROTEINUR, UROBILINOGEN, NITRITE, LEUKOCYTESUR in the last 72 hours.  Invalid input(s): APPERANCEUR    Imaging: Ct Head Wo Contrast  Result Date: 04/11/2018 CLINICAL DATA:  New onset seizure EXAM: CT HEAD WITHOUT CONTRAST TECHNIQUE: Contiguous axial images were obtained from the base of the skull through the vertex without intravenous contrast. COMPARISON:  Brain MRI 04/11/2018, CT brain 02/21/2018 FINDINGS: Brain: No acute territorial infarction, hemorrhage or intracranial mass. Moderate small vessel ischemic changes of the white matter. Stable ventricle size Vascular: No hyperdense vessels. Scattered carotid vascular calcification. Skull: Normal. Negative for fracture or focal lesion. Sinuses/Orbits: No acute finding. Other: None IMPRESSION: 1.  No CT evidence for acute intracranial abnormality. 2. Moderate small vessel ischemic changes of the white matter Electronically Signed   By: Donavan Foil M.D.   On: 04/11/2018 22:33   Mr Brain Wo Contrast  Result Date: 04/11/2018 CLINICAL DATA:  New onset seizures. History of end-stage renal disease on dialysis, HIV, hepatitis B. EXAM: MRI HEAD WITHOUT CONTRAST TECHNIQUE: Multiplanar, multiecho pulse sequences of the brain and surrounding structures were obtained without intravenous contrast. COMPARISON:  MRI of the head February 23, 2018. FINDINGS: Multiple sequences are mildly motion degraded. INTRACRANIAL CONTENTS: No reduced diffusion to suggest acute ischemia. Numerous similar chronic microhemorrhages most compatible with chronic hypertension. Moderate parenchymal brain volume loss. No suspicious parenchymal signal, masses, mass effect. Confluent supratentorial white matter FLAIR T2 hyperintensities. No abnormal extra-axial fluid collections. No extra-axial masses. Hippocampi demonstrate generally symmetric morphology and signal, limited by motion degraded in size coronal T2. VASCULAR: Normal major intracranial vascular flow voids present at skull base. SKULL AND UPPER CERVICAL SPINE: No abnormal sellar expansion. No suspicious calvarial bone marrow signal. Craniocervical junction maintained. SINUSES/OR RIGHT mastoid effusion. BITS: The mastoid air-cells and included paranasal sinuses are well-aerated.The included ocular globes and orbital contents are non-suspicious. OTHER: Patient is edentulous. IMPRESSION: 1. No acute intracranial process. 2. Stable moderate parenchymal brain volume loss. 3. Stable moderate to severe chronic small vessel ischemic changes. Electronically Signed   By: Elon Alas M.D.   On: 04/11/2018 18:34   Dg Chest Port 1 View  Result Date: 04/11/2018 CLINICAL DATA:  Acute respiratory distress. EXAM: PORTABLE CHEST 1 VIEW COMPARISON:  Chest x-ray from yesterday. FINDINGS: Stable  cardiomegaly. Unchanged small to moderate left pleural effusion with left lower lobe atelectasis versus infiltrate. Slightly improved interstitial markings and hazy density at the right lung base. No pneumothorax. No acute osseous abnormality. IMPRESSION: 1. Unchanged small to moderate left pleural effusion with adjacent left lower lobe atelectasis versus infiltrate. 2. Improving interstitial edema. Electronically Signed   By: Titus Dubin M.D.   On: 04/11/2018 13:35     Medications:    . bictegravir-emtricitabine-tenofovir AF  1 tablet Oral Daily  . calcium acetate  667-2,001 mg Oral TID WC  . carvedilol  25 mg Oral BID WC  . Chlorhexidine Gluconate Cloth  6 each Topical Q0600  . cloNIDine  0.1 mg Transdermal Weekly  . heparin  5,000 Units Subcutaneous Q8H  . hydrALAZINE  100 mg Oral Q8H  . lacosamide  75 mg Oral BID  . megestrol  40 mg Oral Daily  . multivitamin  1 tablet Oral QHS   acetaminophen **OR** acetaminophen, albuterol, calcium acetate, traZODone  Assessment/ Plan:  Pt is a 62 y.o. yo male with a PMHX of ESRD 2/2 HTN and NSAID use on home HD 4x/week, HTN, well-controlled HIV, recent  NSTEMI, seizures, hyperthyroidism, asthma, and duodenal mass who was recently admitted for acute on chronic anemia in the setting of GI bleed after ERCP for duodenal mass biopsy now presenting with shortness of breath at rest, orthopnea, PND that started yesterday.   ESRD on HD presenting with V overload - Last HD 7/22 with UF 4L (MWF schedule). Does appear dyspneic on exam. Will plan for HD today with lower UF as he had a seizure after prior session. [place] for discharge from renal standpoint.  HTN - BP remains elevated. On Coreg, clonidine, and Hydralazine. His hydralazine was increased to 100 mg TID yesterday but BP does not appear improved.  Volume- Improving with HD. Plan for HD today.    Still has some edema and dyspnea  Plan dialysis  Ok to discharge after treatment  Continue with home therapy  MD Anemia: at baseline  HIV: on Biktarvy  Seizure: states has had 3 in the past, most recent on in June 2019 in the setting of uremia. Started on Vimpat (Keppra d/c due to thrombocytopenia and dilantin interacts with HIV meds). Brain imaging with chronic small vessel disease but no acute intracranial process. Neurology following.       LOS: 3 Jeffrey [name] 04/11/2018 10:58 AM

## 2018-04-13 NOTE — Care Management Important Message (Signed)
Important Message  Patient Details  Name: Jeffrey [name] MRN: [id number] Date of Birth: 1955-12-31   Medicare Important Message Given:  Yes    [name] 04/13/2018, 4:07 PM

## 2018-04-13 NOTE — Progress Notes (Signed)
  Date: 04/13/2018  Patient name: Jeffrey [name]  Medical record number: [id number]  Date of birth: 1956-08-16   I have seen and evaluated this patient and I have discussed the plan of care with the house staff. Please see their note for complete details. I concur with their findings with the following additions/corrections: Jeffrey [name] was seen on AM rounds with team.  His bed weight today, on our measurement, was 78 kg, 2 kg above his apparent dry weight.  Nephrology is repeating dialysis today and then he will be stable for discharge to home with [facility] follow-up.  He plans to get a new scale as he thinks his current scale is not accurate.  Bartholomew Crews, MD 04/13/2018, 2:53 PM

## 2018-04-13 NOTE — Progress Notes (Addendum)
Patient BP has remained elevated with SBP >200.  IMTS notified. Told to monitor for an symptoms otherwise no medical intervention indicated at this time  Patient has been asymptomaticand resting comfortably   No acute events overnight

## 2018-04-14 ENCOUNTER — Other Ambulatory Visit: Payer: Self-pay | Admitting: Internal Medicine

## 2018-04-14 LAB — CBC
HCT: 27 % — ABNORMAL LOW (ref 39.0–52.0)
Hemoglobin: 8.6 g/dL — ABNORMAL LOW (ref 13.0–17.0)
MCH: 29.9 pg (ref 26.0–34.0)
MCHC: 31.9 g/dL (ref 30.0–36.0)
MCV: 93.8 fL (ref 78.0–100.0)
Platelets: 200 10*3/uL (ref 150–400)
RBC: 2.88 MIL/uL — ABNORMAL LOW (ref 4.22–5.81)
RDW: 17.4 % — AB (ref 11.5–15.5)
WBC: 9.4 10*3/uL (ref 4.0–10.5)

## 2018-04-14 LAB — RENAL FUNCTION PANEL
ALBUMIN: 2.3 g/dL — AB (ref 3.5–5.0)
Anion gap: 13 (ref 5–15)
BUN: 45 mg/dL — AB (ref 8–23)
CALCIUM: 7.9 mg/dL — AB (ref 8.9–10.3)
CO2: 27 mmol/L (ref 22–32)
CREATININE: 9.27 mg/dL — AB (ref 0.61–1.24)
Chloride: 99 mmol/L (ref 98–111)
GFR calc Af Amer: 6 mL/min — ABNORMAL LOW (ref >60)
GFR calc non Af Amer: 5 mL/min — ABNORMAL LOW (ref >60)
Glucose, Bld: 90 mg/dL (ref 70–99)
PHOSPHORUS: 3.2 mg/dL (ref 2.5–4.6)
Potassium: 3.3 mmol/L — ABNORMAL LOW (ref 3.5–5.1)
SODIUM: 139 mmol/L (ref 135–145)

## 2018-04-14 MED ORDER — LACOSAMIDE 50 MG PO TABS: 75.0000 mg | ORAL_TABLET | Freq: Two times a day (BID) | ORAL | 2 refills | Status: DC

## 2018-04-14 MED ORDER — HYDRALAZINE HCL 100 MG PO TABS: 100.0000 mg | ORAL_TABLET | Freq: Three times a day (TID) | ORAL | 1 refills | Status: DC

## 2018-04-14 MED ORDER — CHLORHEXIDINE GLUCONATE CLOTH 2 % EX PADS: 6.0000 | MEDICATED_PAD | Freq: Every day | CUTANEOUS | Status: DC

## 2018-04-14 NOTE — Telephone Encounter (Signed)
Patient has a high no show rate. Would prefer to limit to 30 days until he is seen for hospital follow up. Thanks!

## 2018-04-14 NOTE — Progress Notes (Signed)
  Date: 04/14/2018  Patient name: Jeffrey [name]  Medical record number: [id number]  Date of birth: [birth year]   I have seen and evaluated this patient and I have discussed the plan of care with the house staff. Please see their note for complete details. I concur with their findings.  Bartholomew Crews, MD 04/14/2018, 3:22 PM

## 2018-04-14 NOTE — Procedures (Signed)
Seen on HD, reviewed treatment with patient and RN.  Arterial needle bled yesterday and treatment was stopped early.  This hasn't been an issue today.  UFG 3L - patient concerned, saying this is the max he can UF but agreeable to trying it.  If BP trending down (currently ~200) or clinical change can always modify. He's agreeable.  Jeffrey Schwalbe MD

## 2018-04-14 NOTE — Telephone Encounter (Signed)
Pt getting discharged from hospital-Rx for 30-day supply of hydralazine has already been sent to pharmacy, but pharmacy is requesting 90-day.  Will send to pcp for review and qty change if appropriate.  Please advise.Despina Hidden Cassady7/25/20194:19 PM

## 2018-04-14 NOTE — Progress Notes (Signed)
   Subjective: Mr. Tse was seen and evaluated at bedside this morning. He appears very drowsy, and his brother states he did not get much sleep last night due to dyspnea and restlessness. When asked this morning, he states his breathing is doing well. He was unable to get dialyzed due to bleeding at his access site. We discussed getting another HD session today and will be discharged home afterwards. He denies headaches, vision changes, chest pain, palpitations, abdominal pain, changes in bowel movements.     Objective:  Vital signs in last 24 hours: Vitals:   04/14/18 0826 04/14/18 1107 04/14/18 1205 04/14/18 1335  BP: (!) 127/109 (!) 200/92  (!) 206/108  Pulse:   79 84  Resp:   17 (!) 21  Temp:   99.4 F (37.4 C) 99.4 F (37.4 C)  TempSrc:   Oral Oral  SpO2:   100% 100%  Weight:    177 lb 0.5 oz (80.3 kg)  Height:       Filed Weights   04/13/18 1400 04/14/18 0321 04/14/18 1335  Weight: 178 lb 9.2 oz (81 kg) 180 lb 8.9 oz (81.9 kg) 177 lb 0.5 oz (80.3 kg)   Physical Exam General: Drowsy but arousable male, lying in bed in NAD.  Cardiovascular: RRR. No murmurs, gallops or rubs.  Respiratory: No increased work of breathing. Speaking in complete sentences. Mild bibasilar crackles.  Abdominal: BS+. Abdomen is soft, non-distended, non-tender.  Ext: trace bilateral lower extremity edema  Assessment/Plan:  Active Problems:   Dyspnea  1. Dyspnea 2/2 volume overload in the setting ESRD - patient's most recent dry weight is 76 kg;83.3 kg on admission; went down to 78 kg s/p HD but has gone back up to 80 kg since Monday.   - currently on 3L nasal canula - was unable to get HD yesterday d/t bleeding at access site.  -nephrology plans on HD session today and then can likely be discharged home - will likely decrease patient's dry weight with recent weight loss  2.Seizure -generalized, tonic-clonic after returning from HD 04/11/18 - ABG revealed no acidosis; imaging ruled out any  acute intracranial process; etiology may have been fluid/electrolyte shift during HD; nephrology will dialyze at lower UF today - has had 3 seizures in the past, most recently in 02/2018 in the setting of uremia.  - placed on Vimpat (Keppra d/c due to thromboyctopenia and dilantin interacts with HIV meds); will follow-up with outpatient neurology to adjust dosing in the setting of ESRD as needed. Patient was advised not to drive for 6 months.  3. HTN Recent NSTEMI 2/2 hypertensive emergency at [facility] admission on 6/24. - PRES was ruled out after seizure - remains persistently hypertensive but asymptomatic; expect to improve after another HD session today.  - nephrology increased hydralazine to 100 mg tid; appreciate their input - aspirin being held until follow-up appt with GI at [facility] in the setting of recent upper GI bleed   4. HIV - well controlled. Continue Biktarvy    Dispo: Anticipated discharge in approximately today after HD.   Modena Nunnery D, DO 04/14/2018, 2:12 PM Pager: [phone]

## 2018-04-14 NOTE — Progress Notes (Addendum)
[facility] ROUNDING NOTE   Subjective:   Interval History: Not feeling well this morning. Concern about his blood pressure.    Objective:  Vital signs in last 24 hours:  Temp:  [98.8 F (37.1 C)-99.4 F (37.4 C)] 98.8 F (37.1 C) (07/25 0800) Pulse Rate:  [81-93] 89 (07/25 0800) Resp:  [28-34] 28 (07/25 0800) BP: (127-231)/(73-116) 200/92 (07/25 1107) SpO2:  [96 %-100 %] 96 % (07/25 0800) Weight:  [178 lb 9.2 oz (81 kg)-180 lb 8.9 oz (81.9 kg)] 180 lb 8.9 oz (81.9 kg) (07/25 0321)  Weight change: -1 lb 1.6 oz (-0.5 kg) Filed Weights   04/13/18 0500 04/13/18 1400 04/14/18 0321  Weight: 179 lb 10.8 oz (81.5 kg) 178 lb 9.2 oz (81 kg) 180 lb 8.9 oz (81.9 kg)    Intake/Output: I/O last 3 completed shifts: In: 505 [P.O.:480; IV Piggyback:25] Out: -    Intake/Output this shift:  No intake/output data recorded.  General - well-developed, well-nourished male, sitting up in chair uncomfortable but in NAD  CVS- RRR, no mrg  RS- decreased breath sounds at L base, mild bibasilar crackles, appears mildly dyspneic when talking  ABD- BS present soft non-distended EXT- trace edema bilaterally    Basic Metabolic Panel: Recent Labs  Lab 04/11/18 0713 04/11/18 1410 04/12/18 0208 04/13/18 0329 04/14/18 0306  NA 135 137 137 140 139  K 3.5 3.5 3.4* 3.4* 3.3*  CL 100 96* 98 103 99  CO2 21* 25 29 28 27   GLUCOSE 96 120* 93 106* 90  BUN 87* 30* 33* 50* 45*  CREATININE 12.17* 6.02* 7.19* 9.19* 9.27*  CALCIUM 8.2* 7.9* 7.9* 7.7* 7.9*  PHOS 3.6  --  2.5 3.9 3.2    Liver Function Tests: Recent Labs  Lab 04/11/18 0713 04/11/18 1410 04/12/18 0208 04/13/18 0329 04/14/18 0306  AST  --  18  --   --   --   ALT  --  9  --   --   --   ALKPHOS  --  67  --   --   --   BILITOT  --  0.5  --   --   --   PROT  --  5.8*  --   --   --   ALBUMIN 2.3* 2.5* 2.2* 2.2* 2.3*   No results for input(s): LIPASE, AMYLASE in the last 168 hours. No results for input(s): AMMONIA in the  last 168 hours.  CBC: Recent Labs  Lab 04/11/18 0247 04/11/18 1410 04/12/18 0208 04/13/18 0329 04/14/18 0306  WBC 8.6 14.6* 7.3 8.9 9.4  HGB 9.0* 9.8* 8.7* 8.6* 8.6*  HCT 26.4* 29.2* 26.1* 26.2* 27.0*  MCV 89.2 89.6 90.9 92.6 93.8  PLT 187 212 171 178 200    Cardiac Enzymes: No results for input(s): CKTOTAL, CKMB, CKMBINDEX, TROPONINI in the last 168 hours.  BNP: Invalid input(s): POCBNP  CBG: No results for input(s): GLUCAP in the last 168 hours.  Microbiology: Results for orders placed or performed during the hospital encounter of 03/31/18  MRSA PCR Screening     Status: None   Collection Time: 03/31/18  9:49 PM  Result Value Ref Range Status   MRSA by PCR NEGATIVE NEGATIVE Final    Comment:        The GeneXpert MRSA Assay (FDA approved for NASAL specimens only), is one component of a comprehensive MRSA colonization surveillance program. It is not intended to diagnose MRSA infection nor to guide or monitor treatment for MRSA infections. Performed  at [facility], [place] [address] [place], [place] [address]     Coagulation Studies: No results for input(s): LABPROT, INR in the last 72 hours.  Urinalysis: No results for input(s): COLORURINE, LABSPEC, PHURINE, GLUCOSEU, HGBUR, BILIRUBINUR, KETONESUR, PROTEINUR, UROBILINOGEN, NITRITE, LEUKOCYTESUR in the last 72 hours.  Invalid input(s): APPERANCEUR    Imaging: No results found.   Medications:    . bictegravir-emtricitabine-tenofovir AF  1 tablet Oral Daily  . calcium acetate  667-2,001 mg Oral TID WC  . carvedilol  25 mg Oral BID WC  . Chlorhexidine Gluconate Cloth  6 each Topical Q0600  . cloNIDine  0.1 mg Transdermal Weekly  . heparin  5,000 Units Subcutaneous Q8H  . hydrALAZINE  100 mg Oral Q8H  . lacosamide  75 mg Oral BID  . megestrol  40 mg Oral Daily  . multivitamin  1 tablet Oral QHS   acetaminophen **OR** acetaminophen, albuterol, calcium acetate, traZODone  Assessment/ Plan:  Pt  is a 62 y.o. yo male with a PMHX of ESRD 2/2 HTN and NSAID use on home HD 4x/week, HTN, well-controlled HIV, recent NSTEMI, seizures, hyperthyroidism, asthma, and duodenal mass who was recently admitted for acute on chronic anemia in the setting of GI bleed after ERCP for duodenal mass biopsy now presenting with shortness of breath at rest, orthopnea, PND that started yesterday.   ESRD on HD presenting with V overload - Last HD 7/22 with UF 4L (MWF schedule). Plan for HD yesterday, but patient started bleeding through fistula. He appears volume up on exam and BP back in 200s. Will need HD today. If he re-bleeds, will need a fistulogram for further evaluation. Otherwise he is ok for discharge from renal standpoint if able to tolerate HD.  HTN - BP improved in 130-140s this AM, but in the 200s when seen likely from volume overload. Will plan for HD today. On Coreg, clonidine, and Hydralazine. His hydralazine was increased to 100 mg TID during this admission.  Volume- Volume up on exam with bilateral crackles, dyspnea, and LE edema. Plan for HD today. OK for discharge after HD if no complications.  Anemia: at baseline  HIV: on Biktarvy  Seizure: states has had 3 in the past, most recent on in June 2019 in the setting of uremia. Started on Vimpat (Keppra d/c due to thrombocytopenia and dilantin interacts with HIV meds). Brain imaging with chronic small vessel disease but no acute intracranial process. Neurology following.     I have seen and examined this patient and agree with the plan of care. Problem with button hole. Brother cannulates patient   Will continue to follow  Plan dialysis today  Sherril Croon 04/14/2018, 4:38 PM   LOS: 4 Jeffrey [name] 04/11/2018 11:27 AM

## 2018-04-15 DIAGNOSIS — Z880 Allergy status to penicillin: Secondary | ICD-10-CM

## 2018-04-15 DIAGNOSIS — Z888 Allergy status to other drugs, medicaments and biological substances status: Secondary | ICD-10-CM

## 2018-04-15 NOTE — Progress Notes (Signed)
  Order seen for Fistulogram.  IR schedule unable to accommodate today.  I have spoken with the patient and with  Dr. Koleen Distance and both are aware.  Patient is on our outpatient schedule for next Wednesday, July 31, at [place] scheduler will call patient with instructions on what time to be here.   WENDY S BLAIR PA-C 04/15/2018 10:00 AM

## 2018-04-15 NOTE — Discharge Summary (Signed)
Name: Jeffrey [name] Jeffrey [name], Jeffrey [name] 62 y.o. PCP: Velna Ochs, MD  Date of Admission: 04/10/2018  5:26 AM Date of Discharge: 04/15/2018 Attending Physician: Dr. Lynnae January Discharge Diagnosis: 1. Dyspnea 2/2 volume overload in the setting of ESRD 2. Seizure 3. HTN  Discharge Medications: Allergies as of 04/15/2018      Reactions   Lisinopril Anaphylaxis, Shortness Of Breath   Throat swelling   Penicillins Anaphylaxis, Other (See Comments)   Childhood allergy Has patient had a PCN reaction causing immediate rash, facial/tongue/throat swelling, SOB or lightheadedness with hypotension: Yes Has patient had a PCN reaction causing severe rash involving mucus membranes or skin necrosis: Unk Has patient had a PCN reaction that required hospitalization: Unk Has patient had a PCN reaction occurring within the last 10 years: No If all of the above answers are "NO", then may proceed with Cephalosporin use.      Medication List    TAKE these medications   albuterol (2.5 MG/3ML) 0.083% nebulizer solution Commonly known as:  PROVENTIL Take 2.5 mg by nebulization every 6 (six) hours as needed for wheezing or shortness of breath.   bictegravir-emtricitabine-tenofovir AF 50-200-25 MG Tabs tablet Commonly known as:  BIKTARVY Take 1 tablet by mouth daily.   calcium acetate 667 MG capsule Commonly known as:  PHOSLO Take 667-2,001 mg by mouth See admin instructions. Take [phone] mg three times a day with each meal and 667-1,334 mg with each snack   carvedilol 25 MG tablet Commonly known as:  COREG Take 1 tablet (25 mg total) by mouth 2 (two) times daily with a meal.   cloNIDine 0.1 mg/24hr patch Commonly known as:  CATAPRES - Dosed in mg/24 hr UNWRAP AND APPLY 1 PATCH TO SKIN EVERY 7 DAYS   clotrimazole-betamethasone cream Commonly known as:  LOTRISONE Apply 1 application topically 2 (two) times daily.   COMBIVENT IN Inhale 2 puffs into the lungs every 6 (six) hours  as needed (as needed for shortness of breath).   epoetin alfa 2000 UNIT/ML injection Commonly known as:  EPOGEN,PROCRIT Inject 2,000 Units into the vein 3 (three) times a week.   ethyl chloride spray Apply 1 application topically daily as needed (HD- to numb).   heparin 1000 UNIT/ML injection Inject 3,000 Units into the vein 4 (four) times a week.   hydrALAZINE 100 MG tablet Commonly known as:  APRESOLINE Take 1 tablet (100 mg total) by mouth 3 (three) times daily. What changed:    medication strength  See the new instructions.   iron sucrose 20 MG/ML injection Commonly known as:  VENOFER Inject 100 mg into the vein every Monday.   lacosamide 50 MG Tabs tablet Commonly known as:  VIMPAT Take 1.5 tablets (75 mg total) by mouth 2 (two) times daily.   megestrol 40 MG tablet Commonly known as:  MEGACE Take 2 tablets (80 mg total) by mouth 2 (two) times daily. What changed:    how much to take  when to take this   multivitamin Tabs tablet Take 1 tablet by mouth daily.   omeprazole 20 MG capsule Commonly known as:  PRILOSEC Take 1 capsule (20 mg total) by mouth daily.   traZODone 50 MG tablet Commonly known as:  DESYREL TAKE 1 TABLET(50 MG) BY MOUTH AT BEDTIME AS NEEDED FOR SLEEP   VOLTAREN 1 % Gel Generic drug:  diclofenac sodium APPLY 2 GRAMS EXTERNALLY TO THE AFFECTED AREA FOUR TIMES DAILY What changed:    how much to take  how  to take this  when to take this  reasons to take this  additional instructions       Disposition and follow-up:   Jeffrey [name] was discharged from [facility] in Stable condition.  At the hospital follow up visit please address:  1.  Dyspnea 2/2 volume overload in setting of ESRD; patient received dialysis twice this admission. Please assess volume and respiratory status. There was some indiscretions in weights inpatient versus weights at home. His brother ordered new scales and planned on calibrating them  in outpatient clinic. There were also plans to do fistulagram inpatient was not able to be done by IR until outpatient scheduled for 7/31. Weight at discharge was 168.  Seizure: patient had a generalized ton-clonic seizure after returning from HD on 04/11/18. He was placed on Vimpat. No further seizure activity since 7/22. Outpatient neurology referral was placed to further titrate Vimpat as needed in the setting of patient's ESRD.  Please ensure that he has scheduled follow-up. Patient was advised not to drive for 6 months.  HTN - patient's blood pressure continues to be difficult to manage. He remains asymptomatic. Nephrology increased hydralazine to 100 mg tid.   2.  Labs / imaging needed at time of follow-up: CBC, BMP 3.  Pending labs/ test needing follow-up: none Follow-up Appointments: Follow-up [place] Follow up.   Specialty:  Home Health Services Why:  resume HHPT Contact information: [address] [place] [address] [place] by problem list: 1. Dyspnea 2/2 volume overload in the setting of ESRD - patients weight on admission was 182 which was approximately 15 lbs increased from most recent dry weight of 167. He presented with shortness of breath and orthopnea. He received HD x2 while admitted which significantly improved his respiratory status. 4L were pulled off the first session (patient had a seizure afterwards, see below), and 3L were pulled of the second session which he tolerated well. Weight at discharge was 168.  2. Seizure: approximately 30 minutes after returning from HD patient had a generalized, tonic-clonic seizure that lasted around 60 seconds. ABG ruled out acidosis; imaging ruled out any acute intracranial process (there was concern for PRES given patient's severe HTN). Etiology felt to be due to large fluid/electrolyte shift during HD. He has had 3 seizures in the past, most recently in 02/2018  that was in the setting of uremia; was not on any anti-seizure medications at this time. He was started on Vimpat (keppra d/c due to thrombocytopenia in June admission and dilantin interacts with HIV meds). He did not have any further seizure activity during admission, and tolerated second dialysis session at [facility] well. An outpatient neurology referral was placed to adjust Vimpat dosing in the setting of ESRD as needed. Patient was advised not to drive for 6 months. 3. HTN - recent NSTEMI 2/2 hypertensive emergency at [facility] admission on 6/24. PRES was ruled out after seizure.   Patient remained persistently hypertensive but asymptomatic. Blood pressure improved to 400Q systolic after dialysis. His blood pressure has been very difficult to control for quite some time. Nephrology increased hydralazine to 100 mg tid in hopes that this would improve blood pressure. Likely that volume overload contributing to patient's blood pressure; emphasized importance of dry weight maintenance at home with HD.  Discharge Vitals:   BP (!) 121/96 (BP Location: Right Arm)   Pulse 77  Temp 99 F (37.2 C) (Oral)   Resp [name]   Ht 6\' 1"  (1.854 m)   Wt 168 lb 10.4 oz (76.5 kg)   SpO2 100%   BMI 22.25 kg/m   Pertinent Labs, Studies, and Procedures:  BMP Latest Ref Rng & Units 04/18/2018 04/17/2018 04/14/2018  Glucose 70 - 99 mg/dL 86 103(H) 90  BUN 8 - 23 mg/dL 49(H) 38(H) 45(H)  Creatinine 0.61 - 1.24 mg/dL 10.30(H) 7.66(H) 9.27(H)  BUN/Creat Ratio 6 - 22 (calc) - - -  Sodium 135 - 145 mmol/L 135 136 139  Potassium 3.5 - 5.1 mmol/L 4.4 3.8 3.3(L)  Chloride 98 - 111 mmol/L 98 99 99  CO2 22 - 32 mmol/L - 25 27  Calcium 8.9 - 10.3 mg/dL - 8.7(L) 7.9(L)   Filed Weights   04/14/18 0321 04/14/18 1335 04/14/18 1820  Weight: 180 lb 8.9 oz (81.9 kg) 177 lb 0.5 oz (80.3 kg) 168 lb 10.4 oz (76.5 kg)    Discharge Instructions: Discharge Instructions    Ambulatory referral to Neurology   Complete by:  As directed    An  appointment is requested in approximately: 4 weeks   Ambulatory referral to Neurology   Complete by:  As directed    An appointment is requested in approximately: 4 weeks   Call MD for:  difficulty breathing, headache or visual disturbances   Complete by:  As directed    Call MD for:  difficulty breathing, headache or visual disturbances   Complete by:  As directed    Call MD for:  extreme fatigue   Complete by:  As directed    Call MD for:  hives   Complete by:  As directed    Call MD for:  persistant dizziness or light-headedness   Complete by:  As directed    Call MD for:  persistant dizziness or light-headedness   Complete by:  As directed    Call MD for:  persistant nausea and vomiting   Complete by:  As directed    Call MD for:  persistant nausea and vomiting   Complete by:  As directed    Call MD for:  redness, tenderness, or signs of infection (pain, swelling, redness, odor or green/yellow discharge around incision site)   Complete by:  As directed    Call MD for:  severe uncontrolled pain   Complete by:  As directed    Call MD for:  temperature >100.4   Complete by:  As directed    Diet - low sodium heart healthy   Complete by:  As directed    Diet - low sodium heart healthy   Complete by:  As directed    Diet - low sodium heart healthy   Complete by:  As directed    Discharge instructions   Complete by:  As directed    It was a pleasure taking care of you again. We are prescribing you the new anti-seizure medication to continue taking (Vimpat) and have put in a referral for outpatient neurology to follow-up.  Your hydralazine has been adjusted to 100 mg three times a day. Continue taking all your other medications as prescribed. As we discussed, you are more than welcome to come calibrate your new scale in our clinic with the dry weight that nephrology gives you.  We will see you in our clinic in a couple of weeks. Please call beforehand with any questions or concerns.     Increase activity slowly   Complete  by:  As directed    Increase activity slowly   Complete by:  As directed    Increase activity slowly   Complete by:  As directed       Signed: Delice Bison, DO 04/15/2018, 1:26 PM   Pager: [phone]

## 2018-04-15 NOTE — Progress Notes (Signed)
   Subjective: Jeffrey [name] was seen and evaluated at bedside this morning. He states he is feeling much better after dialysis. He feels his breathing has returned to baseline and was able to sleep well last night. He does complain of a mild throbbing headache which he states are like his other headaches that resolve on their own. He did not go home after HD last night because he and his brother would like a fistulagram done prior to discharge in order to ensure they will be able to do HD successfully. After discussion with IR, they will not be able to do this until next Wednesday so patient is agreeable to discharge home.  Denies fevers, chills, chest pain, palpitations, abdominal pain, changes in BMs, or lower extremity swelling.    Objective:  Vital signs in last 24 hours: Vitals:   04/14/18 2332 04/15/18 0331 04/15/18 0404 04/15/18 0802  BP: (!) 175/92 (!) 195/82 (!) 191/78 (!) 178/73  Pulse: 84  75 86  Resp: (!) 27  (!) 22 (!) 26  Temp: 99.1 F (37.3 C)  99.6 F (37.6 C) 99 F (37.2 C)  TempSrc: Oral  Oral Oral  SpO2: 100%  100% 98%  Weight:      Height:       Filed Weights   04/14/18 0321 04/14/18 1335 04/14/18 1820  Weight: 180 lb 8.9 oz (81.9 kg) 177 lb 0.5 oz (80.3 kg) 168 lb 10.4 oz (76.5 kg)   Physical exam: General: awake, alert, pleasant male lying in bed in NAD Cardiovascular: RRR, SEM at LUSB. Respiratory: no increased work of breathing. Saturating well on room air. Mild dependent crackles at left base Abdominal: BS+; abdomen is soft, non-distended, non-tender. Ext: trace bilateral lower extremity edema    Assessment/Plan:  Active Problems:   Dyspnea  1. Dyspnea 2/2 volume overload in the setting ESRD - patient's weight is down 9 pounds since HD yesterday; tolerated pulling off 3L - saturating well on room air - was not discharged yesterday due to request for fistulagram. Spoke with IR, and they are not able to get him an appointment until next Wednesday; patient  was agreeable and is stable for d/c home today.   2.Seizure -generalized, tonic-clonic after returning from HD7/22/19 - ABG revealed no acidosis; imaging ruled out any acute intracranial process; etiology may have beenfluid/electrolyte shift during HD; nephrology willdialyze at lower [facility] today - has had 3 seizures in the past, most recently in 02/2018 in the setting of uremia.  - no further seizure activity this admission - placed on Vimpat (Keppra d/c due to thromboyctopenia and dilantin interacts with HIV meds); will follow-up with outpatient neurology to adjust dosing in the setting of ESRD as needed. Patient was advised not to drive for 6 months.  3. HTN Recent NSTEMI 2/2 hypertensive emergency at [facility] admission on 6/24. - PRES was ruled out after seizure - remains persistently hypertensive but asymptomatic; improved to 545G systolic after dialysis yesterday - nephrology increased hydralazine to 100 mg tid; appreciate their input - aspirin being held until follow-up appt with GI at [facility] in the setting of recent upper GI bleed   4. HIV - well controlled. Continue Biktarvy    Dispo: Anticipated discharge home today.   Jeffrey [name] D, DO 04/15/2018, 8:11 AM Pager: [phone]

## 2018-04-15 NOTE — Progress Notes (Addendum)
[facility] ROUNDING NOTE   Subjective:   Interval History: Dialyzed yesterday with improvement in respiratory symptoms.  Denies shortness of breath this morning and oxygenating well on room air.  Objective:  Vital signs in last 24 hours:  Temp:  [98.9 F (37.2 C)-100 F (37.8 C)] 99 F (37.2 C) (07/26 0802) Pulse Rate:  [75-91] 86 (07/26 0802) Resp:  [17-31] 26 (07/26 0802) BP: (175-220)/(73-115) 178/73 (07/26 0802) SpO2:  [98 %-100 %] 98 % (07/26 0802) Weight:  [168 lb 10.4 oz (76.5 kg)-177 lb 0.5 oz (80.3 kg)] 168 lb 10.4 oz (76.5 kg) (07/25 1820)  Weight change: -1 lb 8.7 oz (-0.7 kg) Filed Weights   04/14/18 0321 04/14/18 1335 04/14/18 1820  Weight: 180 lb 8.9 oz (81.9 kg) 177 lb 0.5 oz (80.3 kg) 168 lb 10.4 oz (76.5 kg)    Intake/Output: I/O last 3 completed shifts: In: 240 [P.O.:240] Out: 3000 [Other:3000]   Intake/Output this shift:  No intake/output data recorded.  General - well-developed, well-nourished male, laying flat in bed in no acute distress CVS- RRR, no mrg  RS- decreased breath sounds at L base  ABD- BS present soft non-distended EXT- trace edema bilaterally    Basic Metabolic Panel: Recent Labs  Lab 04/11/18 0713 04/11/18 1410 04/12/18 0208 04/13/18 0329 04/14/18 0306  NA 135 137 137 140 139  K 3.5 3.5 3.4* 3.4* 3.3*  CL 100 96* 98 103 99  CO2 21* 25 29 28 27   GLUCOSE 96 120* 93 106* 90  BUN 87* 30* 33* 50* 45*  CREATININE 12.17* 6.02* 7.19* 9.19* 9.27*  CALCIUM 8.2* 7.9* 7.9* 7.7* 7.9*  PHOS 3.6  --  2.5 3.9 3.2    Liver Function Tests: Recent Labs  Lab 04/11/18 0713 04/11/18 1410 04/12/18 0208 04/13/18 0329 04/14/18 0306  AST  --  18  --   --   --   ALT  --  9  --   --   --   ALKPHOS  --  67  --   --   --   BILITOT  --  0.5  --   --   --   PROT  --  5.8*  --   --   --   ALBUMIN 2.3* 2.5* 2.2* 2.2* 2.3*   No results for input(s): LIPASE, AMYLASE in the last 168 hours. No results for input(s): AMMONIA in  the last 168 hours.  CBC: Recent Labs  Lab 04/11/18 0247 04/11/18 1410 04/12/18 0208 04/13/18 0329 04/14/18 0306  WBC 8.6 14.6* 7.3 8.9 9.4  HGB 9.0* 9.8* 8.7* 8.6* 8.6*  HCT 26.4* 29.2* 26.1* 26.2* 27.0*  MCV 89.2 89.6 90.9 92.6 93.8  PLT 187 212 171 178 200    Cardiac Enzymes: No results for input(s): CKTOTAL, CKMB, CKMBINDEX, TROPONINI in the last 168 hours.  BNP: Invalid input(s): POCBNP  CBG: No results for input(s): GLUCAP in the last 168 hours.  Microbiology: Results for orders placed or performed during the hospital encounter of 03/31/18  MRSA PCR Screening     Status: None   Collection Time: 03/31/18  9:49 PM  Result Value Ref Range Status   MRSA by PCR NEGATIVE NEGATIVE Final    Comment:        The GeneXpert MRSA Assay (FDA approved for NASAL specimens only), is one component of a comprehensive MRSA colonization surveillance program. It is not intended to diagnose MRSA infection nor to guide or monitor treatment for MRSA infections. Performed at [facility]  [facility], [place] [address] [place], [place] [address]     Coagulation Studies: No results for input(s): LABPROT, INR in the last 72 hours.  Urinalysis: No results for input(s): COLORURINE, LABSPEC, PHURINE, GLUCOSEU, HGBUR, BILIRUBINUR, KETONESUR, PROTEINUR, UROBILINOGEN, NITRITE, LEUKOCYTESUR in the last 72 hours.  Invalid input(s): APPERANCEUR    Imaging: No results found.   Medications:    . bictegravir-emtricitabine-tenofovir AF  1 tablet Oral Daily  . calcium acetate  667-2,001 mg Oral TID WC  . carvedilol  25 mg Oral BID WC  . Chlorhexidine Gluconate Cloth  6 each Topical Q0600  . cloNIDine  0.1 mg Transdermal Weekly  . heparin  5,000 Units Subcutaneous Q8H  . hydrALAZINE  100 mg Oral Q8H  . lacosamide  75 mg Oral BID  . megestrol  40 mg Oral Daily  . multivitamin  1 tablet Oral QHS   acetaminophen **OR** acetaminophen, albuterol, calcium acetate, traZODone  Assessment/ Plan:   Pt is a 62 y.o. yo male with a PMHX of ESRD 2/2 HTN and NSAID use on home HD 4x/week, HTN, well-controlled HIV, recent NSTEMI, seizures, hyperthyroidism, asthma, and duodenal mass who was recently admitted for acute on chronic anemia in the setting of GI bleed after ERCP for duodenal mass biopsy now presenting with shortness of breath at rest, orthopnea, PND that started yesterday.   ESRD on HD presenting with V overload - Last HD 7/25 with UF 3L (MWF schedule). Tolerate treatment well and without complications.  Order fistulogram for evaluation of fistula after episode of bleeding during dialysis.  This is scheduled for July 31.  Patient otherwise stable for discharge.  Advised to continue his home dialysis as usual and follow-up with his nephrologist as an outpatient. HTN - blood pressure mildly improved after increasing home hydralazine.  Now in the 170s.  Volume- volume status improved after dialysis yesterday with removal of 3 L.  Okay for discharge from renal standpoint, can continue dialysis at home as usual.  We will follow-up with his nephrologist as an outpatient. Anemia: at baseline  HIV: on Biktarvy  Seizure: states has had 3 in the past, most recent on in June 2019 in the setting of uremia. Started on Vimpat.  No further seizure activity during this admission.  Patient appears stable for discharge. Fistulogram could be performed as an outpatient    LOS: 5 Idalys Santos-Sanchez 04/11/2018 11:26 AM

## 2018-04-15 NOTE — Progress Notes (Signed)
  Date: 04/15/2018  Patient name: Jeffrey [name]  Medical record number: [id number]  Date of birth: [birth year]   I have seen and evaluated this patient and I have discussed the plan of care with the house staff. Please see their note for complete details. I concur with their findings with the following additions/corrections: Jeffrey [name] was seen on AM rounds with team. Stable for D/C home.  Bartholomew Crews, MD 04/15/2018, 5:22 PM

## 2018-04-17 ENCOUNTER — Emergency Department (HOSPITAL_COMMUNITY): Payer: Medicare Other

## 2018-04-17 ENCOUNTER — Other Ambulatory Visit: Payer: Self-pay

## 2018-04-17 ENCOUNTER — Emergency Department (HOSPITAL_BASED_OUTPATIENT_CLINIC_OR_DEPARTMENT_OTHER): Payer: Medicare Other

## 2018-04-17 ENCOUNTER — Emergency Department (HOSPITAL_COMMUNITY)
Admission: EM | Admit: 2018-04-17 | Discharge: 2018-04-17 | Disposition: A | Discharge status: Home / Self Care | Payer: Medicare Other | Attending: Emergency Medicine | Admitting: Emergency Medicine

## 2018-04-17 DIAGNOSIS — I10 Essential (primary) hypertension: Secondary | ICD-10-CM

## 2018-04-17 DIAGNOSIS — Z992 Dependence on renal dialysis: Secondary | ICD-10-CM | POA: Diagnosis not present

## 2018-04-17 DIAGNOSIS — I120 Hypertensive chronic kidney disease with stage 5 chronic kidney disease or end stage renal disease: Secondary | ICD-10-CM | POA: Diagnosis not present

## 2018-04-17 DIAGNOSIS — M79609 Pain in unspecified limb: Secondary | ICD-10-CM | POA: Diagnosis not present

## 2018-04-17 DIAGNOSIS — Z87891 Personal history of nicotine dependence: Secondary | ICD-10-CM | POA: Diagnosis not present

## 2018-04-17 DIAGNOSIS — Z79899 Other long term (current) drug therapy: Secondary | ICD-10-CM | POA: Diagnosis not present

## 2018-04-17 DIAGNOSIS — R609 Edema, unspecified: Secondary | ICD-10-CM | POA: Diagnosis not present

## 2018-04-17 DIAGNOSIS — J45909 Unspecified asthma, uncomplicated: Secondary | ICD-10-CM | POA: Insufficient documentation

## 2018-04-17 DIAGNOSIS — N186 End stage renal disease: Secondary | ICD-10-CM | POA: Insufficient documentation

## 2018-04-17 DIAGNOSIS — J181 Lobar pneumonia, unspecified organism: Secondary | ICD-10-CM | POA: Diagnosis not present

## 2018-04-17 DIAGNOSIS — R569 Unspecified convulsions: Secondary | ICD-10-CM | POA: Diagnosis not present

## 2018-04-17 DIAGNOSIS — Z21 Asymptomatic human immunodeficiency virus [HIV] infection status: Secondary | ICD-10-CM | POA: Insufficient documentation

## 2018-04-17 DIAGNOSIS — R0602 Shortness of breath: Secondary | ICD-10-CM | POA: Diagnosis not present

## 2018-04-17 DIAGNOSIS — M7989 Other specified soft tissue disorders: Secondary | ICD-10-CM | POA: Diagnosis not present

## 2018-04-17 DIAGNOSIS — G4489 Other headache syndrome: Secondary | ICD-10-CM | POA: Diagnosis not present

## 2018-04-17 LAB — CBC WITH DIFFERENTIAL/PLATELET
Abs Immature Granulocytes: 0 10*3/uL (ref 0.0–0.1)
Basophils Absolute: 0 10*3/uL (ref 0.0–0.1)
Basophils Relative: 0 %
EOS ABS: 0.1 10*3/uL (ref 0.0–0.7)
EOS PCT: 2 %
HEMATOCRIT: 30.6 % — AB (ref 39.0–52.0)
Hemoglobin: 9.4 g/dL — ABNORMAL LOW (ref 13.0–17.0)
IMMATURE GRANULOCYTES: 1 %
LYMPHS ABS: 0.6 10*3/uL — AB (ref 0.7–4.0)
Lymphocytes Relative: 7 %
MCH: 29.7 pg (ref 26.0–34.0)
MCHC: 30.7 g/dL (ref 30.0–36.0)
MCV: 96.8 fL (ref 78.0–100.0)
MONO ABS: 0.7 10*3/uL (ref 0.1–1.0)
MONOS PCT: 8 %
Neutro Abs: 7.2 10*3/uL (ref 1.7–7.7)
Neutrophils Relative %: 82 %
Platelets: 155 10*3/uL (ref 150–400)
RBC: 3.16 MIL/uL — ABNORMAL LOW (ref 4.22–5.81)
RDW: 16.9 % — AB (ref 11.5–15.5)
WBC: 8.7 10*3/uL (ref 4.0–10.5)

## 2018-04-17 LAB — BASIC METABOLIC PANEL
Anion gap: 12 (ref 5–15)
BUN: 38 mg/dL — AB (ref 8–23)
CHLORIDE: 99 mmol/L (ref 98–111)
CO2: 25 mmol/L (ref 22–32)
Calcium: 8.7 mg/dL — ABNORMAL LOW (ref 8.9–10.3)
Creatinine, Ser: 7.66 mg/dL — ABNORMAL HIGH (ref 0.61–1.24)
GFR calc Af Amer: 8 mL/min — ABNORMAL LOW (ref >60)
GFR calc non Af Amer: 7 mL/min — ABNORMAL LOW (ref >60)
GLUCOSE: 103 mg/dL — AB (ref 70–99)
POTASSIUM: 3.8 mmol/L (ref 3.5–5.1)
Sodium: 136 mmol/L (ref 135–145)

## 2018-04-17 LAB — BRAIN NATRIURETIC PEPTIDE: B NATRIURETIC PEPTIDE 5: 1944.4 pg/mL — AB (ref 0.0–100.0)

## 2018-04-17 LAB — I-STAT TROPONIN, ED: Troponin i, poc: 0.08 ng/mL (ref 0.00–0.08)

## 2018-04-17 MED ORDER — LABETALOL HCL 5 MG/ML IV SOLN
20.0000 mg | Freq: Once | INTRAVENOUS | Status: DC
  Filled 2018-04-17: qty 4

## 2018-04-17 MED ORDER — ONDANSETRON HCL 4 MG/2ML IJ SOLN
4.0000 mg | Freq: Once | INTRAMUSCULAR | Status: AC
  Administered 2018-04-17: 4 mg via INTRAVENOUS
  Filled 2018-04-17: qty 2

## 2018-04-17 MED ORDER — LORAZEPAM 2 MG/ML IJ SOLN
INTRAMUSCULAR | Status: AC
  Filled 2018-04-17: qty 1

## 2018-04-17 MED ORDER — MORPHINE SULFATE (PF) 4 MG/ML IV SOLN
4.0000 mg | Freq: Once | INTRAVENOUS | Status: AC
  Administered 2018-04-17: 4 mg via INTRAVENOUS
  Filled 2018-04-17: qty 1

## 2018-04-17 NOTE — Progress Notes (Signed)
VASCULAR LAB PRELIMINARY  PRELIMINARY  PRELIMINARY  PRELIMINARY  Left upper extremity venous duplex completed.    Preliminary report:  There is no obvious evidence of DVT or SVT noted in the visualized veins of the left upper extremity.  No perigraft fluid noted.   Called results to Dr. Lucinda Dell, [facility], RVT 04/17/2018, 11:58 AM

## 2018-04-17 NOTE — ED Provider Notes (Signed)
[facility] EMERGENCY DEPARTMENT Provider Note   CSN: [id number] Arrival date & time: 04/17/18  0957     History   Chief Complaint Chief Complaint  Patient presents with  . Headache    HPI CHANDAN FLY is a 62 y.o. male.  Pt presents to the ED today with sob and headache.  Pt is a ESRD patient on HD who does dialysis 4 days a week at home.  His brother accesses his fistula.  The pt last had dialysis last evening.  The pt developed sob while on the machine, and the dialysis was stopped after 2.5 hrs.  The pt has noticed increased pain and swelling in his left forearm and arm.  Dr. Justin Mend is aware and has scheduled pt for a fistula gram this Wednesday the 31st.  The last time pt c/o headache was when he had a seizure (takes vimpat).  Pt is hypertensive, but said he's been compliant with his meds and took them today.     Past Medical History:  Diagnosis Date  . Anemia   . Anxiety   . Arthritis   . Asthma    per pt hx  . End stage renal disease on home HD 07/10/2011   Started HD in September 2012 at [facility] with a tunneled HD catheter, now on home HD with NxtStage. Dialyzing through AVF L lower arm with buttonhole technique as of mid 2014. His brother does the HD treatments at home.  They are roommates for 23 years.  The brother works 3rd shift and gets off about 8am and then puts Mr Bekker on HD in the morning after getting home. Most of the time he does HD about 4 times a week, for about 4 hours per treatment. Cause of ESRD was HTN according to patient. He says he let his health go and ending up with complications, and that he didn't like seeing doctors in those days.  He says he was diagnosed with severe HTN when he lived in [place] in his 89's.   . Hepatitis B carrier ([place])   . HIV infection ([place])   . Hypertension   . Hyperthyroidism    normal now  . Pneumonia several yrs ago  . Seizure ([place])   . Seizures ([place]) 06/02/2011   x 1 none since  . Thrombocytopenia  [facility])     Patient Active Problem List   Diagnosis Date Noted  . Dyspnea 04/10/2018  . Loud snoring 04/08/2018  . Upper GI bleed 04/08/2018  . Malnutrition of moderate degree 02/23/2018  . Seizure ([place]) 02/22/2018  . Encounter for central line placement   . Lactic acidosis   . Acute uremia   . Mass of duodenum   . Anemia of chronic kidney failure, unspecified stage   . Infectious joint disease 10/04/2017  . Symptomatic anemia 09/23/2017  . Antibiotic long-term use   . Septic arthritis ([place]) 09/15/2017  . Encounter for incision and drainage procedure 09/15/2017  . Effusion of left knee 09/08/2017  . Intertrigo 07/09/2017  . AC joint arthropathy 06/16/2017  . Pseudoaneurysm of arteriovenous dialysis fistula ([place]) 12/23/2016  . Eczema 12/10/2016  . Bilateral low back pain without sciatica   . Aortic valve insufficiency   . Fall 09/08/2016  . Knee abrasion, right, initial encounter 09/08/2016  . Lumbosacral spondylosis without myelopathy 08/03/2016  . Pulmonary nodules 08/03/2016  . Essential hypertension   . Anemia of chronic disease 04/08/2016  . Secondary hyperparathyroidism ([place]) 02/09/2014  . Asthma 02/09/2014  .  Nuclear sclerotic cataract of both eyes 12/12/2013  . Myopia with presbyopia of both eyes 10/26/2013  . COPD (chronic obstructive pulmonary disease) ([place]) 06/27/2013  . Thrombocytopenia ([place]) 01/09/2013  . HBV (hepatitis B virus) infection 01/09/2013  . Anxiety 07/22/2011  . End stage renal disease on home HD 07/10/2011  . HIV disease ([place]) 06/17/2011    Past Surgical History:  Procedure Laterality Date  . AV FISTULA PLACEMENT  06/02/11   Left radiocephalic AVF  . BIOPSY  02/17/2018   Procedure: BIOPSY;  Surgeon: Milus Banister, MD;  Location: [facility] ENDOSCOPY;  Service: Endoscopy;;  . COLONOSCOPY    . COLONOSCOPY WITH PROPOFOL N/A 01/27/2018   Procedure: COLONOSCOPY WITH PROPOFOL;  Surgeon: [name], MD;  Location: [facility] ENDOSCOPY;  Service:  Gastroenterology;  Laterality: N/A;  . CORONARY ANGIOPLASTY    . ERCP  03/21/2018   AT [place]  . ESOPHAGOGASTRODUODENOSCOPY N/A 02/17/2018   Procedure: ESOPHAGOGASTRODUODENOSCOPY (EGD);  Surgeon: Milus Banister, MD;  Location: [facility] ENDOSCOPY;  Service: Endoscopy;  Laterality: N/A;  . ESOPHAGOGASTRODUODENOSCOPY (EGD) WITH PROPOFOL N/A 01/27/2018   Procedure: ESOPHAGOGASTRODUODENOSCOPY (EGD) WITH PROPOFOL;  Surgeon: [name], MD;  Location: [facility] ENDOSCOPY;  Service: Gastroenterology;  Laterality: N/A;  . ESOPHAGOGASTRODUODENOSCOPY (EGD) WITH PROPOFOL N/A 04/03/2018   Procedure: ESOPHAGOGASTRODUODENOSCOPY (EGD) WITH PROPOFOL;  Surgeon: Clarene Essex, MD;  Location: [place];  Service: Endoscopy;  Laterality: N/A;  . EUS N/A 02/17/2018   Procedure: UPPER ENDOSCOPIC ULTRASOUND (EUS) RADIAL;  Surgeon: Milus Banister, MD;  Location: [facility] ENDOSCOPY;  Service: Endoscopy;  Laterality: N/A;  . fistulaogram     x 2 last 2 years  . IR FLUORO GUIDE CV LINE RIGHT  09/16/2017  . IR US GUIDE VASC ACCESS RIGHT  09/16/2017  . IRRIGATION AND DEBRIDEMENT KNEE Left 09/15/2017   Procedure: IRRIGATION AND DEBRIDEMENT KNEE; arthroscopic clean out;  Surgeon: [name], MD;  Location: [facility] ORS;  Service: Orthopedics;  Laterality: Left;  . OTHER SURGICAL HISTORY     removal temporary HD catheter   . REVISON OF ARTERIOVENOUS FISTULA Left 10/10/2015   Procedure: REVISON OF LEFT RADIOCEPHALIC ARTERIOVENOUS FISTULA;  Surgeon: Angelia Mould, MD;  Location: [place];  Service: Vascular;  Laterality: Left;  . REVISON OF ARTERIOVENOUS FISTULA Left 02/07/2016   Procedure: REPAIR OF PSEUDO-ANEUREYSM OF LEFT ARM  ARTERIOVENOUS FISTULA;  Surgeon: Angelia Mould, MD;  Location: [place];  Service: Vascular;  Laterality: Left;  . REVISON OF ARTERIOVENOUS FISTULA Left 8/88/9169   Procedure: PLICATION OF LEFT ARM RADIOCEPHALIC ARTERIOVENOUS FISTULA PSEUDOANEURYSM;  Surgeon: Angelia Mould, MD;  Location: [facility];   Service: Vascular;  Laterality: Left;  . RIGHT/LEFT HEART CATH AND CORONARY ANGIOGRAPHY N/A 02/17/2017   Procedure: Right/Left Heart Cath and Coronary Angiography;  Surgeon: Sherren Mocha, MD;  Location: [facility] CV LAB;  Service: Cardiovascular;  Laterality: N/A;  . TEE WITHOUT CARDIOVERSION N/A 09/11/2016   Procedure: TRANSESOPHAGEAL ECHOCARDIOGRAM (TEE);  Surgeon: Dorothy Spark, MD;  Location: [facility] END[facility]COPY;  Service: Cardiovascular;  Laterality: N/A;        Home Medications    Prior to Admission medications   Medication Sig Start Date End Date Taking? Authorizing Provider  albuterol (PROVENTIL) (2.5 MG/3ML) 0.083% nebulizer solution Take 2.5 mg by nebulization every 6 (six) hours as needed for wheezing or shortness of breath.   Yes [provider]  bictegravir-emtricitabine-tenofovir AF (BIKTARVY) 50-200-25 MG TABS tablet Take 1 tablet by mouth daily. 12/27/17  Yes Campbell Riches, MD  calcium acetate (PHOSLO) [phone]  MG capsule Take 667-2,001 mg by mouth See admin instructions. Take 1,334 mg three times a day with each meal and 667 mg with each snack 06/06/15  Yes [provider]  carvedilol (COREG) 25 MG tablet Take 1 tablet (25 mg total) by mouth 2 (two) times daily with a meal. 04/04/18  Yes Alphonzo Grieve, MD  cloNIDine (CATAPRES - DOSED IN MG/24 HR) 0.1 mg/24hr patch UNWRAP AND APPLY 1 PATCH TO SKIN EVERY 7 DAYS 01/28/18  Yes Velna Ochs, MD  epoetin alfa (EPOGEN,PROCRIT) 2000 UNIT/ML injection Inject 2,000 Units into the vein 3 (three) times a week.    Yes [provider]  ethyl chloride spray Apply 1 application topically daily as needed (HD- to numb).  05/20/15  Yes [provider]  heparin 1000 UNIT/ML injection Inject 3,000 Units into the vein 4 (four) times a week.    Yes [provider]  hydrALAZINE (APRESOLINE) 100 MG tablet Take 1 tablet (100 mg total) by mouth 3 (three) times daily. 04/14/18  Yes Velna Ochs, MD  iron  sucrose (VENOFER) 20 MG/ML injection Inject 100 mg into the vein every Monday.    Yes [provider]  lacosamide (VIMPAT) 50 MG TABS tablet Take 1.5 tablets (75 mg total) by mouth 2 (two) times daily. 04/14/18  Yes Velna Ochs, MD  megestrol (MEGACE) 40 MG tablet Take 2 tablets (80 mg total) by mouth 2 (two) times daily. Patient taking differently: Take 40 mg by mouth daily.  02/28/18  Yes Kathi Ludwig, MD  multivitamin (RENA-VIT) TABS tablet Take 1 tablet by mouth daily.     Yes [provider]  clotrimazole-betamethasone (LOTRISONE) cream Apply 1 application topically 2 (two) times daily. Patient not taking: Reported on 04/10/2018 09/08/17   Campbell Riches, MD  Ipratropium-Albuterol (COMBIVENT IN) Inhale 2 puffs into the lungs every 6 (six) hours as needed (as needed for shortness of breath).     [provider]  omeprazole (PRILOSEC) 20 MG capsule Take 1 capsule (20 mg total) by mouth daily. Patient not taking: Reported on 04/10/2018 01/31/18   [name], MD  traZODone (DESYREL) 50 MG tablet TAKE 1 TABLET(50 MG) BY MOUTH AT BEDTIME AS NEEDED FOR SLEEP 01/31/18   Campbell Riches, MD  VOLTAREN 1 % GEL APPLY 2 GRAMS EXTERNALLY TO THE AFFECTED AREA FOUR TIMES DAILY Patient taking differently: Apply 2 g topically 3 (three) times daily as needed (pain).  09/08/17   Campbell Riches, MD    Family History Family History  Problem Relation Age of Onset  . Hypertension Mother   . Diabetes Mother   . Cancer Father     Social History Social History   Tobacco Use  . Smoking status: Former Smoker    Years: 10.00    Types: Cigarettes    Last attempt to quit: 08/08/2015    Years since quitting: 2.6  . Smokeless tobacco: Never Used  . Tobacco comment: casual smoking for 10 years  Substance Use Topics  . Alcohol use: No    Alcohol/week: 0.0 oz    Comment: quit 2004  . Drug use: No    Comment: uds (+) cocaine in 08/2011 but pt states was taking  sudafed at the time     Allergies   Lisinopril and Penicillins   Review of Systems Review of Systems  Respiratory: Positive for shortness of breath.   Neurological: Positive for headaches.  All other systems reviewed and are negative.    Physical Exam Updated Vital  Signs BP (!) 233/108 (BP Location: Right Arm)   Pulse 90   Temp 98.7 F (37.1 C) (Oral)   Resp 20   Ht 6\' 2"  (1.88 m)   Wt 76 kg (167 lb 8.8 oz)   SpO2 100%   BMI 21.51 kg/m   Physical Exam  Constitutional: He is oriented to person, place, and time. He appears well-developed and well-nourished.  HENT:  Head: Normocephalic and atraumatic.  Mouth/Throat: Oropharynx is clear and moist.  Eyes: Pupils are equal, round, and reactive to light. EOM are normal.  Neck: Normal range of motion. Neck supple.  Cardiovascular: Normal rate and regular rhythm.  Pulmonary/Chest: Effort normal and breath sounds normal.  Abdominal: Soft. Bowel sounds are normal.  Musculoskeletal:  Left forearm with AVF and good thrill. Swelling to left forearm.  No redness.  Neurological: He is alert and oriented to person, place, and time. He has normal strength.  Skin: Skin is warm and dry. Capillary refill takes less than 2 seconds.  Psychiatric: He has a normal mood and affect. His behavior is normal.  Nursing note and vitals reviewed.    ED Treatments / Results  Labs (all labs ordered are listed, but only abnormal results are displayed) Labs Reviewed  BASIC METABOLIC PANEL - Abnormal; Notable for the following components:      Result Value   Glucose, Bld 103 (*)    BUN 38 (*)    Creatinine, Ser 7.66 (*)    Calcium 8.7 (*)    GFR calc non Af Amer 7 (*)    GFR calc Af Amer 8 (*)    All other components within normal limits  CBC WITH DIFFERENTIAL/PLATELET - Abnormal; Notable for the following components:   RBC 3.16 (*)    Hemoglobin 9.4 (*)    HCT 30.6 (*)    RDW 16.9 (*)    Lymphs Abs 0.6 (*)    All other components within  normal limits  BRAIN NATRIURETIC PEPTIDE - Abnormal; Notable for the following components:   B Natriuretic Peptide 1,944.4 (*)    All other components within normal limits  I-STAT TROPONIN, ED    EKG None  Radiology Dg Chest 2 View  Result Date: 04/17/2018 CLINICAL DATA:  Dyspnea EXAM: CHEST - 2 VIEW COMPARISON:  04/11/2018 chest radiograph. FINDINGS: Stable cardiomediastinal silhouette with mild cardiomegaly. No pneumothorax. Small left pleural effusion. No right pleural effusion. Mild pulmonary edema. Left lung base consolidation. Right lung hyperinflation. IMPRESSION: 1. Cardiomegaly with mild pulmonary edema, suggesting congestive heart failure. 2. Small left pleural effusion. 3. Left lung base consolidation, favor atelectasis. 4. Hyperinflated right lung, suggesting underlying obstructive lung disease. Electronically Signed   By: Ilona Sorrel M.D.   On: 04/17/2018 12:48    Procedures Procedures (including critical care time)  Medications Ordered in ED Medications  labetalol (NORMODYNE,TRANDATE) injection 20 mg (has no administration in time range)  morphine 4 MG/ML injection 4 mg (4 mg Intravenous Given 04/17/18 1051)  ondansetron (ZOFRAN) injection 4 mg (4 mg Intravenous Given 04/17/18 1051)     Initial Impression / Assessment and Plan / ED Course  I have reviewed the triage vital signs and the nursing notes.  Pertinent labs & imaging results that were available during my care of the patient were reviewed by me and considered in my medical decision making (see chart for details).  Pt is feeling much better.  Mild CHF on CXR, but he is oxygenating well.  BP better.  Pt instructed to return if  worse.  F/u to get his fistulogram appt as scheduled.  Final Clinical Impressions(s) / ED Diagnoses   Final diagnoses:  ESRD on hemodialysis [facility])  Essential hypertension    ED Discharge Orders    None       Isla Pence, MD 04/17/18 1358

## 2018-04-17 NOTE — Discharge Instructions (Addendum)
Keep your appointment for fistulogram to evaluate your fistula.

## 2018-04-17 NOTE — ED Triage Notes (Signed)
EMS called for reports of pain to top of head as well as SOB - onset this am; does hemodialysis at home as needed; however, routinely runs 4 days per week; last had dialysis last PM; however, only ran 2 1/2 hours as he developed SOB while on machine

## 2018-04-18 ENCOUNTER — Other Ambulatory Visit: Payer: Medicare Other

## 2018-04-18 ENCOUNTER — Encounter (HOSPITAL_COMMUNITY): Payer: Self-pay

## 2018-04-18 ENCOUNTER — Emergency Department (HOSPITAL_COMMUNITY)
Admission: EM | Admit: 2018-04-18 | Discharge: 2018-04-18 | Disposition: A | Discharge status: Home / Self Care | Payer: Medicare Other | Attending: Emergency Medicine | Admitting: Emergency Medicine

## 2018-04-18 DIAGNOSIS — J45909 Unspecified asthma, uncomplicated: Secondary | ICD-10-CM | POA: Diagnosis not present

## 2018-04-18 DIAGNOSIS — Z21 Asymptomatic human immunodeficiency virus [HIV] infection status: Secondary | ICD-10-CM | POA: Diagnosis not present

## 2018-04-18 DIAGNOSIS — Z87891 Personal history of nicotine dependence: Secondary | ICD-10-CM | POA: Insufficient documentation

## 2018-04-18 DIAGNOSIS — I120 Hypertensive chronic kidney disease with stage 5 chronic kidney disease or end stage renal disease: Secondary | ICD-10-CM | POA: Diagnosis not present

## 2018-04-18 DIAGNOSIS — M25522 Pain in left elbow: Secondary | ICD-10-CM | POA: Diagnosis not present

## 2018-04-18 DIAGNOSIS — S0990XA Unspecified injury of head, initial encounter: Secondary | ICD-10-CM | POA: Diagnosis not present

## 2018-04-18 DIAGNOSIS — I10 Essential (primary) hypertension: Secondary | ICD-10-CM | POA: Diagnosis not present

## 2018-04-18 DIAGNOSIS — R451 Restlessness and agitation: Secondary | ICD-10-CM | POA: Diagnosis not present

## 2018-04-18 DIAGNOSIS — M7989 Other specified soft tissue disorders: Secondary | ICD-10-CM | POA: Diagnosis not present

## 2018-04-18 DIAGNOSIS — R2242 Localized swelling, mass and lump, left lower limb: Secondary | ICD-10-CM | POA: Insufficient documentation

## 2018-04-18 DIAGNOSIS — N186 End stage renal disease: Secondary | ICD-10-CM | POA: Diagnosis not present

## 2018-04-18 DIAGNOSIS — Z7901 Long term (current) use of anticoagulants: Secondary | ICD-10-CM | POA: Insufficient documentation

## 2018-04-18 DIAGNOSIS — R2241 Localized swelling, mass and lump, right lower limb: Secondary | ICD-10-CM | POA: Insufficient documentation

## 2018-04-18 DIAGNOSIS — E0590 Thyrotoxicosis, unspecified without thyrotoxic crisis or storm: Secondary | ICD-10-CM | POA: Diagnosis not present

## 2018-04-18 DIAGNOSIS — Z9289 Personal history of other medical treatment: Secondary | ICD-10-CM

## 2018-04-18 DIAGNOSIS — Z79899 Other long term (current) drug therapy: Secondary | ICD-10-CM | POA: Diagnosis not present

## 2018-04-18 DIAGNOSIS — Z992 Dependence on renal dialysis: Secondary | ICD-10-CM | POA: Insufficient documentation

## 2018-04-18 DIAGNOSIS — R609 Edema, unspecified: Secondary | ICD-10-CM | POA: Diagnosis not present

## 2018-04-18 DIAGNOSIS — R0602 Shortness of breath: Secondary | ICD-10-CM | POA: Diagnosis not present

## 2018-04-18 LAB — I-STAT CHEM 8, ED
BUN: 49 mg/dL — ABNORMAL HIGH (ref 8–23)
Calcium, Ion: 1.14 mmol/L — ABNORMAL LOW (ref 1.15–1.40)
Chloride: 98 mmol/L (ref 98–111)
Creatinine, Ser: 10.3 mg/dL — ABNORMAL HIGH (ref 0.61–1.24)
Glucose, Bld: 86 mg/dL (ref 70–99)
HEMATOCRIT: 26 % — AB (ref 39.0–52.0)
Hemoglobin: 8.8 g/dL — ABNORMAL LOW (ref 13.0–17.0)
Potassium: 4.4 mmol/L (ref 3.5–5.1)
Sodium: 135 mmol/L (ref 135–145)
TCO2: 26 mmol/L (ref 22–32)

## 2018-04-18 MED ORDER — HYDROCODONE-ACETAMINOPHEN 5-325 MG PO TABS: 1.0000 | ORAL_TABLET | Freq: Four times a day (QID) | ORAL | 0 refills | Status: DC | PRN

## 2018-04-18 MED ORDER — PREDNISONE 20 MG PO TABS: 40.0000 mg | ORAL_TABLET | Freq: Every day | ORAL | 0 refills | Status: AC

## 2018-04-18 MED ORDER — CARVEDILOL 12.5 MG PO TABS
25.0000 mg | ORAL_TABLET | Freq: Once | ORAL | Status: AC
  Administered 2018-04-18: 25 mg via ORAL
  Filled 2018-04-18: qty 2

## 2018-04-18 MED ORDER — PREDNISONE 20 MG PO TABS
60.0000 mg | ORAL_TABLET | Freq: Once | ORAL | Status: AC
  Administered 2018-04-18: 60 mg via ORAL
  Filled 2018-04-18: qty 3

## 2018-04-18 MED ORDER — LACOSAMIDE 50 MG PO TABS
75.0000 mg | ORAL_TABLET | Freq: Two times a day (BID) | ORAL | Status: DC
  Administered 2018-04-18: 75 mg via ORAL

## 2018-04-18 MED ORDER — HYDRALAZINE HCL 25 MG PO TABS
100.0000 mg | ORAL_TABLET | Freq: Once | ORAL | Status: AC
  Administered 2018-04-18: 100 mg via ORAL
  Filled 2018-04-18: qty 4

## 2018-04-18 NOTE — ED Notes (Signed)
Pt caregiver  given d/c instructions and scripts. Taken to lobby via wheelchair. Pt nor caregiver expressed any concerns prior to being discharged.

## 2018-04-18 NOTE — ED Provider Notes (Signed)
  Face-to-face evaluation   History: He is here for evaluation of restlessness and swelling.  Restlessness prevented him from getting home dialysis today.  He has been having problems with fistula in his left arm, with an upcoming fistulogram for 2 days from now.  He has history of gout.  Physical exam: Patient guards against movement of the left arm secondary to left elbow pain.  Fistula has good pulse and appears normal in the left forearm.  Neurovascular intact distally in the left hand.  There is mild swelling left hand.  Medical decision making-likely multifactorial swelling left hand.  Consider gout left elbow as source for worsening condition.  Doubt acute malfunction of left arm fistula.  Patient can continue home dialysis at this time.  Medical screening examination/treatment/procedure(s) were conducted as a shared visit with non-physician practitioner(s) and myself.  I personally evaluated the patient during the encounter    Daleen Bo, MD 04/20/18 1143

## 2018-04-18 NOTE — ED Triage Notes (Signed)
Pt brought in by EMS due to having an headache. Pt was seen yesterday for the same. Pt hypertensive on arrival. Pt has not been taking home meds.

## 2018-04-18 NOTE — ED Provider Notes (Signed)
[facility] EMERGENCY DEPARTMENT Provider Note   CSN: [id number] Arrival date & time: 04/18/18  0844     History   Chief Complaint Chief Complaint  Patient presents with  . Headache    HPI [name] is a 62 y.o. male.  HPI   Jeffrey [name] is a 62yo male with a history of ESRD (HD at home four times a week with assistance of brother), HIV, COPD, HTN, seizures, anxiety who presents to the emergency department via EMS for evaluation of bilateral foot swelling. Patient states that over the past two days he has had worsening bilateral foot swelling. Denies foot pain.  Denies recent injury or trauma. States that because of the foot swelling he cannot have hemodialysis. He denies fever, chills, headache, chest pain, sob, abdominal pain, leg swelling, numbness, weakness, lightheadedness. States that he occasionally vomits, no emesis today. States that he did not take any of his medications today. Has AV fistula in left arm, has a fistulogram scheduled in three days d/t swelling in the arm.   Per brother at bedside, patient was more agitated overnight and restless.  He did not get any sleep and he is concerned that the patient may be having a bad reaction to the morphine that he received yesterday in the emergency department.  He also reports that he is concerned about the left forearm swelling over his AV fistula.  He states that patient has not been complaining of any pain and has not had a fever, that he is scheduled for appointment in two days to have fistulogram.    Past Medical History:  Diagnosis Date  . Anemia   . Anxiety   . Arthritis   . Asthma    per pt hx  . End stage renal disease on home HD 07/10/2011   Started HD in September 2012 at [facility] with a tunneled HD catheter, now on home HD with [facility]. Dialyzing through AVF L lower arm with buttonhole technique as of mid 2014. His brother does the HD treatments at home.  They are roommates for 23 years.  The  brother works 3rd shift and gets off about 8am and then puts Jeffrey [name] on HD in the morning after getting home. Most of the time he does HD about 4 times a week, for about 4 hours per treatment. Cause of ESRD was HTN according to patient. He says he let his health go and ending up with complications, and that he didn't like seeing doctors in those days.  He says he was diagnosed with severe HTN when he lived in [place] in his 29's.   . Hepatitis B carrier ([place])   . HIV infection ([place])   . Hypertension   . Hyperthyroidism    normal now  . Pneumonia several yrs ago  . Seizure ([place])   . Seizures ([place]) 06/02/2011   x 1 none since  . Thrombocytopenia [facility])     Patient Active Problem List   Diagnosis Date Noted  . Dyspnea 04/10/2018  . Loud snoring 04/08/2018  . Upper GI bleed 04/08/2018  . Malnutrition of moderate degree 02/23/2018  . Seizure ([place]) 02/22/2018  . Encounter for central line placement   . Lactic acidosis   . Acute uremia   . Mass of duodenum   . Anemia of chronic kidney failure, unspecified stage   . Infectious joint disease 10/04/2017  . Symptomatic anemia 09/23/2017  . Antibiotic long-term use   . Septic arthritis ([place]) 09/15/2017  .  Encounter for incision and drainage procedure 09/15/2017  . Effusion of left knee 09/08/2017  . Intertrigo 07/09/2017  . AC joint arthropathy 06/16/2017  . Pseudoaneurysm of arteriovenous dialysis fistula ([place]) 12/23/2016  . Eczema 12/10/2016  . Bilateral low back pain without sciatica   . Aortic valve insufficiency   . Fall 09/08/2016  . Knee abrasion, right, initial encounter 09/08/2016  . Lumbosacral spondylosis without myelopathy 08/03/2016  . Pulmonary nodules 08/03/2016  . Essential hypertension   . Anemia of chronic disease 04/08/2016  . Secondary hyperparathyroidism ([place]) 02/09/2014  . Asthma 02/09/2014  . Nuclear sclerotic cataract of both eyes 12/12/2013  . Myopia with presbyopia of both eyes 10/26/2013  . COPD  (chronic obstructive pulmonary disease) ([place]) 06/27/2013  . Thrombocytopenia ([place]) 01/09/2013  . HBV (hepatitis B virus) infection 01/09/2013  . Anxiety 07/22/2011  . End stage renal disease on home HD 07/10/2011  . HIV disease ([place]) 06/17/2011    Past Surgical History:  Procedure Laterality Date  . AV FISTULA PLACEMENT  06/02/11   Left radiocephalic AVF  . BIOPSY  02/17/2018   Procedure: BIOPSY;  Surgeon: Milus Banister, MD;  Location: [facility] ENDOSCOPY;  Service: Endoscopy;;  . COLONOSCOPY    . COLONOSCOPY WITH PROPOFOL N/A 01/27/2018   Procedure: COLONOSCOPY WITH PROPOFOL;  Surgeon: [name], MD;  Location: [facility] ENDOSCOPY;  Service: Gastroenterology;  Laterality: N/A;  . CORONARY ANGIOPLASTY    . ERCP  03/21/2018   AT [place]  . ESOPHAGOGASTRODUODENOSCOPY N/A 02/17/2018   Procedure: ESOPHAGOGASTRODUODENOSCOPY (EGD);  Surgeon: Milus Banister, MD;  Location: [facility] ENDOSCOPY;  Service: Endoscopy;  Laterality: N/A;  . ESOPHAGOGASTRODUODENOSCOPY (EGD) WITH PROPOFOL N/A 01/27/2018   Procedure: ESOPHAGOGASTRODUODENOSCOPY (EGD) WITH PROPOFOL;  Surgeon: [name], MD;  Location: [facility] ENDOSCOPY;  Service: Gastroenterology;  Laterality: N/A;  . ESOPHAGOGASTRODUODENOSCOPY (EGD) WITH PROPOFOL N/A 04/03/2018   Procedure: ESOPHAGOGASTRODUODENOSCOPY (EGD) WITH PROPOFOL;  Surgeon: Clarene Essex, MD;  Location: [facility];  Service: Endoscopy;  Laterality: N/A;  . EUS N/A 02/17/2018   Procedure: UPPER ENDOSCOPIC ULTRASOUND (EUS) RADIAL;  Surgeon: Milus Banister, MD;  Location: [facility] ENDOSCOPY;  Service: Endoscopy;  Laterality: N/A;  . fistulaogram     x 2 last 2 years  . IR FLUORO GUIDE CV LINE RIGHT  09/16/2017  . IR US GUIDE VASC ACCESS RIGHT  09/16/2017  . IRRIGATION AND DEBRIDEMENT KNEE Left 09/15/2017   Procedure: IRRIGATION AND DEBRIDEMENT KNEE; arthroscopic clean out;  Surgeon: [name], MD;  Location: [facility] ORS;  Service: Orthopedics;  Laterality: Left;  . OTHER SURGICAL HISTORY      removal temporary HD catheter   . REVISON OF ARTERIOVENOUS FISTULA Left 10/10/2015   Procedure: REVISON OF LEFT RADIOCEPHALIC ARTERIOVENOUS FISTULA;  Surgeon: Angelia Mould, MD;  Location: [facility];  Service: Vascular;  Laterality: Left;  . REVISON OF ARTERIOVENOUS FISTULA Left 02/07/2016   Procedure: REPAIR OF PSEUDO-ANEUREYSM OF LEFT ARM  ARTERIOVENOUS FISTULA;  Surgeon: Angelia Mould, MD;  Location: [place];  Service: Vascular;  Laterality: Left;  . REVISON OF ARTERIOVENOUS FISTULA Left 5/45/6256   Procedure: PLICATION OF LEFT ARM RADIOCEPHALIC ARTERIOVENOUS FISTULA PSEUDOANEURYSM;  Surgeon: Angelia Mould, MD;  Location: [place];  Service: Vascular;  Laterality: Left;  . RIGHT/LEFT HEART CATH AND CORONARY ANGIOGRAPHY N/A 02/17/2017   Procedure: Right/Left Heart Cath and Coronary Angiography;  Surgeon: Sherren Mocha, MD;  Location: [place] CV LAB;  Service: Cardiovascular;  Laterality: N/A;  . TEE WITHOUT CARDIOVERSION N/A 09/11/2016   Procedure: TRANSESOPHAGEAL ECHOCARDIOGRAM (TEE);  Surgeon: Dorothy Spark, MD;  Location: [facility] ENDOSCOPY;  Service: Cardiovascular;  Laterality: N/A;        Home Medications    Prior to Admission medications   Medication Sig Start Date End Date Taking? Authorizing Provider  albuterol (PROVENTIL) (2.5 MG/3ML) 0.083% nebulizer solution Take 2.5 mg by nebulization every 6 (six) hours as needed for wheezing or shortness of breath.    [provider]  bictegravir-emtricitabine-tenofovir AF (BIKTARVY) 50-200-25 MG TABS tablet Take 1 tablet by mouth daily. 12/27/17   Campbell Riches, MD  calcium acetate (PHOSLO) 667 MG capsule Take 667-2,001 mg by mouth See admin instructions. Take 1,334 mg three times a day with each meal and 667 mg with each snack 06/06/15   [provider]  carvedilol (COREG) 25 MG tablet Take 1 tablet (25 mg total) by mouth 2 (two) times daily with a meal. 04/04/18   Alphonzo Grieve, MD  cloNIDine (CATAPRES  - DOSED IN MG/24 HR) 0.1 mg/24hr patch UNWRAP AND APPLY 1 PATCH TO SKIN EVERY 7 DAYS 01/28/18   Velna Ochs, MD  clotrimazole-betamethasone (LOTRISONE) cream Apply 1 application topically 2 (two) times daily. Patient not taking: Reported on 04/10/2018 09/08/17   Campbell Riches, MD  epoetin alfa (EPOGEN,PROCRIT) 2000 UNIT/ML injection Inject 2,000 Units into the vein 3 (three) times a week.     [provider]  ethyl chloride spray Apply 1 application topically daily as needed (HD- to numb).  05/20/15   [provider]  heparin 1000 UNIT/ML injection Inject 3,000 Units into the vein 4 (four) times a week.     [provider]  hydrALAZINE (APRESOLINE) 100 MG tablet Take 1 tablet (100 mg total) by mouth 3 (three) times daily. 04/14/18   Velna Ochs, MD  Ipratropium-Albuterol (COMBIVENT IN) Inhale 2 puffs into the lungs every 6 (six) hours as needed (as needed for shortness of breath).     [provider]  iron sucrose (VENOFER) 20 MG/ML injection Inject 100 mg into the vein every Monday.     [provider]  lacosamide (VIMPAT) 50 MG TABS tablet Take 1.5 tablets (75 mg total) by mouth 2 (two) times daily. 04/14/18   Velna Ochs, MD  megestrol (MEGACE) 40 MG tablet Take 2 tablets (80 mg total) by mouth 2 (two) times daily. Patient taking differently: Take 40 mg by mouth daily.  02/28/18   Kathi Ludwig, MD  multivitamin (RENA-VIT) TABS tablet Take 1 tablet by mouth daily.      [provider]  omeprazole (PRILOSEC) 20 MG capsule Take 1 capsule (20 mg total) by mouth daily. Patient not taking: Reported on 04/10/2018 01/31/18   [name], MD  traZODone (DESYREL) 50 MG tablet TAKE 1 TABLET(50 MG) BY MOUTH AT BEDTIME AS NEEDED FOR SLEEP 01/31/18   Campbell Riches, MD  VOLTAREN 1 % GEL APPLY 2 GRAMS EXTERNALLY TO THE AFFECTED AREA FOUR TIMES DAILY Patient taking differently: Apply 2 g topically 3 (three) times daily as needed  (pain).  09/08/17   Campbell Riches, MD    Family History Family History  Problem Relation Age of Onset  . Hypertension Mother   . Diabetes Mother   . Cancer Father     Social History Social History   Tobacco Use  . Smoking status: Former Smoker    Years: 10.00    Types: Cigarettes    Last attempt to quit: 08/08/2015    Years since quitting: 2.6  . Smokeless tobacco: Never Used  .  Tobacco comment: casual smoking for 10 years  Substance Use Topics  . Alcohol use: No    Alcohol/week: 0.0 oz    Comment: quit 2004  . Drug use: No    Comment: uds (+) cocaine in 08/2011 but pt states was taking sudafed at the time     Allergies   Lisinopril and Penicillins   Review of Systems Review of Systems  Constitutional: Negative for chills and fever.  HENT: Negative for sore throat.   Eyes: Negative for visual disturbance.  Respiratory: Negative for shortness of breath.   Cardiovascular: Negative for chest pain.  Gastrointestinal: Negative for abdominal pain.  Musculoskeletal: Positive for joint swelling (bilateral foot swelling). Negative for arthralgias.  Skin: Negative for color change, rash and wound.  Neurological: Negative for weakness, light-headedness and numbness.  Psychiatric/Behavioral: Negative for agitation.     Physical Exam Updated Vital Signs BP (!) 130/96   Pulse 93   Temp 98.4 F (36.9 C) (Rectal)   Resp 16   Ht 6\' 2"  (1.88 m)   Wt 75.8 kg (167 lb)   SpO2 98%   BMI 21.44 kg/m   Physical Exam  Constitutional: He appears well-developed and well-nourished. No distress.  Laying at bedside sleeping, easily arousable and conversational. No acute distress.   HENT:  Head: Normocephalic and atraumatic.  Mouth/Throat: Oropharynx is clear and moist.  Eyes: Pupils are equal, round, and reactive to light. Conjunctivae are normal. Right eye exhibits no discharge. Left eye exhibits no discharge.  Neck: Normal range of motion.  Cardiovascular: Normal rate,  regular rhythm and intact distal pulses.  Pulmonary/Chest: Effort normal. No respiratory distress.  Abdominal: Soft.  Abdomen soft and non-distended. No fluid wave. Non-tender to palpation.   Musculoskeletal:  Bilateral foot swelling, no leg or calf swelling. No foot tenderness. No erythema, warmth, ecchymosis, wound or break in skin. PT pulses 1+ and symmetric bilaterally. Sensation to light touch intact in bilateral LE.  Left forearm with palpable thrill. Mild swelling noted compared to right forearm. Tender over left elbow joint and with passive ROM of left elbow. No erythema, induration. Bilateral grip strength 5/5.  Neurological: He is alert. Coordination normal.  Skin: Skin is warm and dry. Capillary refill takes less than 2 seconds. He is not diaphoretic.  Psychiatric: He has a normal mood and affect. His behavior is normal.  Nursing note and vitals reviewed.    ED Treatments / Results  Labs (all labs ordered are listed, but only abnormal results are displayed) Labs Reviewed  I-STAT CHEM 8, ED - Abnormal; Notable for the following components:      Result Value   BUN 49 (*)    Creatinine, Ser 10.30 (*)    Calcium, Ion 1.14 (*)    Hemoglobin 8.8 (*)    HCT 26.0 (*)    All other components within normal limits    EKG None  Radiology Dg Chest 2 View  Result Date: 04/17/2018 CLINICAL DATA:  Dyspnea EXAM: CHEST - 2 VIEW COMPARISON:  04/11/2018 chest radiograph. FINDINGS: Stable cardiomediastinal silhouette with mild cardiomegaly. No pneumothorax. Small left pleural effusion. No right pleural effusion. Mild pulmonary edema. Left lung base consolidation. Right lung hyperinflation. IMPRESSION: 1. Cardiomegaly with mild pulmonary edema, suggesting congestive heart failure. 2. Small left pleural effusion. 3. Left lung base consolidation, favor atelectasis. 4. Hyperinflated right lung, suggesting underlying obstructive lung disease. Electronically Signed   By: Ilona Sorrel M.D.   On:  04/17/2018 12:48    Procedures Procedures (including critical  care time)  Medications Ordered in ED Medications  lacosamide (VIMPAT) tablet 75 mg (75 mg Oral Given 04/18/18 1132)  hydrALAZINE (APRESOLINE) tablet 100 mg (100 mg Oral Given 04/18/18 1101)  carvedilol (COREG) tablet 25 mg (25 mg Oral Given 04/18/18 1101)  predniSONE (DELTASONE) tablet 60 mg (60 mg Oral Given 04/18/18 1326)     Initial Impression / Assessment and Plan / ED Course  I have reviewed the triage vital signs and the nursing notes.  Pertinent labs & imaging results that were available during my care of the patient were reviewed by me and considered in my medical decision making (see chart for details).    Patient has tenderness over his left elbow with palpation and with passive ROM.  No erythema, warmth or signs of infection over his fistula site or over the elbow and I do not suspect infectious process or septic arthritis of the elbow given exam findings.  Patient has a history of gout and this is likely contributing to his pain and swelling in the arm.  Plan to start him on prednisone and also a short course of hydrocodone for pain.  I have counseled his brother to get fistulogram as scheduled in 2 days for further evaluation of his fistula site.  In terms of his bilateral foot swelling, this is likely related to fluid overload in the setting of his ESRD.  He is neurovascularly intact in bilateral lower extremities without signs of infectious process.  No calf tenderness or leg swelling to suggest DVT.  Patient to return home for dialysis today.  Counseled family member at bedside reasons to return to the emergency department and he agrees and appears reliable.  This was a shared visit with Dr. Eulis Foster who also saw the patient and agrees with plan and discharge home.  Final Clinical Impressions(s) / ED Diagnoses   Final diagnoses:  Foot swelling  Restless  History of hemodialysis  Left elbow pain    ED Discharge  Orders        Ordered    HYDROcodone-acetaminophen (NORCO/VICODIN) 5-325 MG tablet  Every 6 hours PRN     04/18/18 1346    predniSONE (DELTASONE) 20 MG tablet  Daily     04/18/18 1349       Bernarda Caffey 04/18/18 1555    Daleen Bo, MD 04/20/18 1143

## 2018-04-18 NOTE — Discharge Instructions (Signed)
The ultrasound from yesterday was reassuring, no blood clots in the left arm.  He does have pain in his left elbow, and this is likely due to gout.  Please give him prednisone starting tomorrow (he got his first dose in the ER today.)  I also written a prescription for hydrocodone which he can take as needed for pain.  This medicine can make him drowsy.  Return to the ER for any new or concerning symptoms like he seems more confused, has trouble breathing, is vomiting, has fever or if you notice redness over his arm.

## 2018-04-19 ENCOUNTER — Ambulatory Visit (INDEPENDENT_AMBULATORY_CARE_PROVIDER_SITE_OTHER): Payer: Medicare Other | Admitting: Physician Assistant

## 2018-04-19 ENCOUNTER — Encounter: Payer: Self-pay | Admitting: Physician Assistant

## 2018-04-19 ENCOUNTER — Other Ambulatory Visit (HOSPITAL_COMMUNITY): Payer: Self-pay | Admitting: Nephrology

## 2018-04-19 ENCOUNTER — Other Ambulatory Visit: Payer: Self-pay | Admitting: Student

## 2018-04-19 VITALS — BP 160/90 | HR 87 | Ht 74.0 in | Wt 174.0 lb

## 2018-04-19 DIAGNOSIS — N189 Chronic kidney disease, unspecified: Secondary | ICD-10-CM

## 2018-04-19 DIAGNOSIS — D631 Anemia in chronic kidney disease: Secondary | ICD-10-CM

## 2018-04-19 DIAGNOSIS — I351 Nonrheumatic aortic (valve) insufficiency: Secondary | ICD-10-CM | POA: Diagnosis not present

## 2018-04-19 DIAGNOSIS — K3189 Other diseases of stomach and duodenum: Secondary | ICD-10-CM

## 2018-04-19 DIAGNOSIS — I2510 Atherosclerotic heart disease of native coronary artery without angina pectoris: Secondary | ICD-10-CM | POA: Diagnosis not present

## 2018-04-19 DIAGNOSIS — I10 Essential (primary) hypertension: Secondary | ICD-10-CM

## 2018-04-19 DIAGNOSIS — T82590A Other mechanical complication of surgically created arteriovenous fistula, initial encounter: Secondary | ICD-10-CM

## 2018-04-19 DIAGNOSIS — N186 End stage renal disease: Secondary | ICD-10-CM

## 2018-04-19 MED ORDER — CLONIDINE 0.2 MG/24HR TD PTWK: 0.2000 mg | MEDICATED_PATCH | TRANSDERMAL | 12 refills | Status: DC

## 2018-04-19 NOTE — Progress Notes (Signed)
Cardiology Office Note:    Date:  04/19/2018   ID:  Jeffrey [name], DOB 1956-01-25, MRN [id number]  PCP:  Velna Ochs, MD  Cardiologist:  Sherren Mocha, MD   Referring MD: Velna Ochs, MD   Chief Complaint  Patient presents with  . Follow-up    Aortic insufficiency    History of Present Illness:    Jeffrey [name] is a 62 y.o. male with aortic insufficiency, end-stage renal disease on hemodialysis (4 times a week; at home), HIV, hypertension, anemia of chronic disease, hypothyroidism, peripheral arterial disease, coronary artery disease.  Cardiac catheterization in May 2018 demonstrated heavily calcified coronary arteries with mild diffuse nonobstructive CAD and severe aortic insufficiency.  Follow-up echocardiograms have demonstrated improved aortic insufficiency.  He was previously evaluated by Dr. Roxan Hockey with [facility].  Given the improvement in his aortic insufficiency, valve replacement was not recommended.    He was last seen in clinic in March 2019 by Daune Perch, NP for surgical clearance prior to a colonoscopy/EGD.  He has a duodenal mass that is being evaluated at [facility].  Since last seen, he has had multiple admissions to the hospital.  He was admitted in June to [facility] for seizures secondary to uremia related to insufficient hemodialysis.  He was admitted to [facility] in June due to toxic metabolic and hypertensive encephalopathy.  During that admission, he did undergo ERCP with partial removal of duodenal mass, biopsy and biliary stent placement.  Of note, he had difficult to control blood pressures during that admission and was noted to have minimally elevated troponin levels without clear trend.  It was felt that this was related to demand ischemia.  He was admitted to [facility] in July 2019 with upper GI bleeding requiring 7 units PRBCs.  He was admitted again in July 2019 with volume overload and developed seizure activity after dialysis.  He was placed on Vimpat.     Jeffrey [name] returns for follow-up.  He is here with his brother.  He does note his symptoms of orthopnea, PND and chest discomfort with lying flat.  These symptoms are fairly recent.  He has also had more difficulty with volume excess.  He denies syncope.  He does note mild lower extremity edema.  Prior CV studies:   The following studies were reviewed today:  Echo 03/09/2017 Severe LVH, EF 65-70, normal wall motion, grade 1 diastolic dysfunction, moderate AI  Cardiac catheterization 02/17/2017 LM ostial 35 LAD moderately calcified, mild nonobstructive disease LCx mild disease RCA mild disease; severely calcified 1. Heavily calcified coronary arteries with mild diffuse nonobstructive CAD 2. Severe aortic valve insufficiency 3. Normal right heart hemodynamics 4. Normal LV systolic function  Transesophageal echocardiogram 09/11/2016 EF 60-65, severe AI, mild MR  Echo 08/08/16 Mod conc LVH, EF 60-65, no RWMA, Gr 2 DD, Ao sclerosis without stenosis, mod AI, mild MR, severe LAE, mild RV dilation, normal RVSF, mild RAE  Echo 04/09/2016 Moderate LVH, EF 60-65, normal wall motion, grade 1 diastolic dysfunction, mild AI, MAC, moderate LAE, trivial pericardial effusion  Past Medical History:  Diagnosis Date  . Anemia   . Anxiety   . Arthritis   . Asthma    per pt hx  . End stage renal disease on home HD 07/10/2011   Started HD in September 2012 at [facility] with a tunneled HD catheter, now on home HD with NxtStage. Dialyzing through AVF L lower arm with buttonhole technique as of mid 2014. His brother does the HD treatments at  home.  They are roommates for 23 years.  The brother works 3rd shift and gets off about 8am and then puts Mr Trombetta on HD in the morning after getting home. Most of the time he does HD about 4 times a week, for about 4 hours per treatment. Cause of ESRD was HTN according to patient. He says he let his health go and ending up with complications, and that he didn't like seeing  doctors in those days.  He says he was diagnosed with severe HTN when he lived in [place] in his 16's.   . Hepatitis B carrier ([place])   . HIV infection ([place])   . Hypertension   . Hyperthyroidism    normal now  . Pneumonia several yrs ago  . Seizure ([place])   . Seizures ([place]) 06/02/2011   x 1 none since  . Thrombocytopenia ([place])    Surgical Hx: The patient  has a past surgical history that includes AV fistula placement (06/02/11); Other surgical history; Colonoscopy; Revison of arteriovenous fistula (Left, 10/10/2015); Revison of arteriovenous fistula (Left, 02/07/2016); Revison of arteriovenous fistula (Left, 04/10/2016); TEE without cardioversion (N/A, 09/11/2016); RIGHT/LEFT HEART CATH AND CORONARY ANGIOGRAPHY (N/A, 02/17/2017); fistulaogram; Irrigation and debridement knee (Left, 09/15/2017); IR Fluoro Guide CV Line Right (09/16/2017); IR US Guide Vasc Access Right (09/16/2017); Coronary angioplasty; Colonoscopy with propofol (N/A, 01/27/2018); Esophagogastroduodenoscopy (egd) with propofol (N/A, 01/27/2018); EUS (N/A, 02/17/2018); biopsy (02/17/2018); Esophagogastroduodenoscopy (N/A, 02/17/2018); ERCP (03/21/2018); and Esophagogastroduodenoscopy (egd) with propofol (N/A, 04/03/2018).   Current Medications: Current Meds  Medication Sig  . albuterol (PROVENTIL) (2.5 MG/3ML) 0.083% nebulizer solution Take 2.5 mg by nebulization every 6 (six) hours as needed for wheezing or shortness of breath.  . bictegravir-emtricitabine-tenofovir AF (BIKTARVY) 50-200-25 MG TABS tablet Take 1 tablet by mouth daily.  . calcium acetate (PHOSLO) 667 MG capsule Take 667-2,001 mg by mouth See admin instructions. Take 1,334 mg three times a day with each meal and 667 mg with each snack  . carvedilol (COREG) 25 MG tablet Take 1 tablet (25 mg total) by mouth 2 (two) times daily with a meal.  . clotrimazole-betamethasone (LOTRISONE) cream Apply 1 application topically 2 (two) times daily.  Marland Kitchen epoetin alfa (EPOGEN,PROCRIT) 2000  UNIT/ML injection Inject 2,000 Units into the vein 3 (three) times a week.   . ethyl chloride spray Apply 1 application topically daily as needed (HD- to numb).   . heparin 1000 UNIT/ML injection Inject 3,000 Units into the vein 4 (four) times a week.   . hydrALAZINE (APRESOLINE) 100 MG tablet Take 1 tablet (100 mg total) by mouth 3 (three) times daily.  Marland Kitchen HYDROcodone-acetaminophen (NORCO/VICODIN) 5-325 MG tablet Take 1 tablet by mouth every 6 (six) hours as needed.  . Ipratropium-Albuterol (COMBIVENT IN) Inhale 2 puffs into the lungs every 6 (six) hours as needed (as needed for shortness of breath).   . iron sucrose (VENOFER) 20 MG/ML injection Inject 100 mg into the vein every Monday.   . lacosamide (VIMPAT) 50 MG TABS tablet Take 1.5 tablets (75 mg total) by mouth 2 (two) times daily.  . megestrol (MEGACE) 40 MG tablet Take 2 tablets (80 mg total) by mouth 2 (two) times daily. (Patient taking differently: Take 40 mg by mouth daily. )  . multivitamin (RENA-VIT) TABS tablet Take 1 tablet by mouth daily.    Marland Kitchen omeprazole (PRILOSEC) 20 MG capsule Take 1 capsule (20 mg total) by mouth daily.  . predniSONE (DELTASONE) 20 MG tablet Take 2 tablets (40 mg total) by mouth  daily for 4 days.  . traZODone (DESYREL) 50 MG tablet TAKE 1 TABLET(50 MG) BY MOUTH AT BEDTIME AS NEEDED FOR SLEEP  . VOLTAREN 1 % GEL APPLY 2 GRAMS EXTERNALLY TO THE AFFECTED AREA FOUR TIMES DAILY (Patient taking differently: Apply 2 g topically 3 (three) times daily as needed (pain). )  . [DISCONTINUED] cloNIDine (CATAPRES - DOSED IN MG/24 HR) 0.1 mg/24hr patch UNWRAP AND APPLY 1 PATCH TO SKIN EVERY 7 DAYS     Allergies:   Lisinopril and Penicillins   Social History   Tobacco Use  . Smoking status: Former Smoker    Years: 10.00    Types: Cigarettes    Last attempt to quit: 08/08/2015    Years since quitting: 2.6  . Smokeless tobacco: Never Used  . Tobacco comment: casual smoking for 10 years  Substance Use Topics  . Alcohol  use: No    Alcohol/week: 0.0 oz    Comment: quit 2004  . Drug use: No    Comment: uds (+) cocaine in 08/2011 but pt states was taking sudafed at the time     Family Hx: The patient's family history includes Cancer in his father; Diabetes in his mother; Hypertension in his mother.  ROS:   Please see the history of present illness.    Review of Systems  Cardiovascular: Positive for leg swelling.   All other systems reviewed and are negative.   EKGs/Labs/Other Test Reviewed:    EKG:  EKG is not ordered today.   ECG from 04/10/2018 personally reviewed-normal sinus rhythm, heart rate 81, normal axis, LVH, QTC 476  Recent Labs: 10/14/2017: TSH 3.325 03/07/2018: Magnesium 1.5 04/11/2018: ALT 9 04/17/2018: B Natriuretic Peptide 1,944.4; Platelets 155 04/18/2018: BUN 49; Creatinine, Ser 10.30; Hemoglobin 8.8; Potassium 4.4; Sodium 135   Recent Lipid Panel Lab Results  Component Value Date/Time   CHOL 120 11/01/2017 10:08 AM   TRIG 106 11/01/2017 10:08 AM   HDL 29 (L) 11/01/2017 10:08 AM   CHOLHDL 4.1 11/01/2017 10:08 AM   LDLCALC 72 11/01/2017 10:08 AM    Physical Exam:    VS:  BP (!) 160/90   Pulse 87   Ht _0  (1.88 m)   Wt 174 lb (78.9 kg)   SpO2 95%   BMI 22.34 kg/m     Wt Readings from Last 3 Encounters:  04/19/18 174 lb (78.9 kg)  04/18/18 167 lb (75.8 kg)  04/17/18 167 lb 8.8 oz (76 kg)     Physical Exam  Constitutional: He is oriented to person, place, and time. He appears well-developed and well-nourished. No distress.  HENT:  Head: Atraumatic.  Eyes: No scleral icterus.  Neck: Neck supple.  Cardiovascular: Normal rate and regular rhythm.  Murmur heard.  Early systolic murmur is present with a grade of 2/6 at the upper right sternal border.  Holodiastolic murmur is present with a grade of 2/6 at the lower left sternal border. Pulmonary/Chest: Effort normal. He has no rales.  Abdominal: Soft.  Musculoskeletal: He exhibits edema (trace bilat ankle edema).    Neurological: He is alert and oriented to person, place, and time.  Skin: Skin is warm and dry.  Psychiatric: He has a normal mood and affect.    ASSESSMENT & PLAN:    Nonrheumatic aortic valve insufficiency Moderate aortic insufficiency by echocardiogram June 2018.  He is has had more difficulty recently with volume excess as well as symptoms orthopnea and PND.  He also notes chest discomfort with lying flat.  Therefore,  I have recommended proceeding with repeat echocardiogram.  If his aortic insufficiency is worse, we will refer him back to Dr. Roxan Hockey.  If it is stable, follow-up in 6 months.  Coronary artery disease involving native coronary artery of native heart without angina pectoris Mild nonobstructive coronary disease by cardiac catheterization in 2018.  End stage renal disease on home HD He is on hemodialysis 4 times a week at home.  His brother helps with managing his dialysis.  Essential hypertension Blood pressure is uncontrolled.  I have recommended increasing his Clonidine patch to 0.2 mg.  Anemia of chronic kidney failure, unspecified stage He has had significant issues with anemia requiring transfusion in the setting of upper GI bleeding.  Most recent admission for anemia was earlier this month.  Mass of duodenum Managed at [facility].   Dispo:  Return in about 6 months (around 10/20/2018) for Routine Follow Up, w/ Dr. Burt Knack.   Medication Adjustments/Labs and Tests Ordered: Current medicines are reviewed at length with the patient today.  Concerns regarding medicines are outlined above.  Tests Ordered: Orders Placed This Encounter  Procedures  . ECHOCARDIOGRAM COMPLETE   Medication Changes: Meds ordered this encounter  Medications  . cloNIDine (CATAPRES - DOSED IN MG/24 HR) 0.2 mg/24hr patch    Sig: Place 1 patch (0.2 mg total) onto the skin once a week.    Dispense:  4 patch    Refill:  [address], Richardson Dopp, PA-C  04/19/2018 10:24 PM    [facility] [place], [place], [place]  [address] Phone: [phone]; Fax: [phone]

## 2018-04-19 NOTE — Patient Instructions (Signed)
Medication Instructions:  1. INCREASE CATAPRES PATCH TO 0.2 MG   Labwork: NONE ORDERED TODAY  Testing/Procedures: Your physician has requested that you have an echocardiogram. Echocardiography is a painless test that uses sound waves to create images of your heart. It provides your doctor with information about the size and shape of your heart and how well your heart's chambers and valves are working. This procedure takes approximately one hour. There are no restrictions for this procedure.    Follow-Up: 6 MONTHS WITH DR. Burt Knack   Any Other Special Instructions Will Be Listed Below (If Applicable).     If you need a refill on your cardiac medications before your next appointment, please call your pharmacy.

## 2018-04-20 ENCOUNTER — Ambulatory Visit (HOSPITAL_COMMUNITY)
Admission: RE | Admit: 2018-04-20 | Discharge: 2018-04-20 | Disposition: A | Discharge status: Home / Self Care | Payer: Medicare Other | Source: Ambulatory Visit | Attending: Nephrology | Admitting: Nephrology

## 2018-04-20 ENCOUNTER — Encounter (HOSPITAL_COMMUNITY): Payer: Self-pay | Admitting: Interventional Radiology

## 2018-04-20 ENCOUNTER — Other Ambulatory Visit (HOSPITAL_COMMUNITY): Payer: Self-pay | Admitting: Nephrology

## 2018-04-20 ENCOUNTER — Ambulatory Visit (HOSPITAL_COMMUNITY): Payer: Medicare Other

## 2018-04-20 DIAGNOSIS — Y832 Surgical operation with anastomosis, bypass or graft as the cause of abnormal reaction of the patient, or of later complication, without mention of misadventure at the time of the procedure: Secondary | ICD-10-CM | POA: Insufficient documentation

## 2018-04-20 DIAGNOSIS — T82590A Other mechanical complication of surgically created arteriovenous fistula, initial encounter: Secondary | ICD-10-CM

## 2018-04-20 DIAGNOSIS — T82858A Stenosis of vascular prosthetic devices, implants and grafts, initial encounter: Secondary | ICD-10-CM | POA: Insufficient documentation

## 2018-04-20 DIAGNOSIS — T82898A Other specified complication of vascular prosthetic devices, implants and grafts, initial encounter: Secondary | ICD-10-CM | POA: Diagnosis not present

## 2018-04-20 DIAGNOSIS — T82837A Hemorrhage of cardiac prosthetic devices, implants and grafts, initial encounter: Secondary | ICD-10-CM | POA: Diagnosis not present

## 2018-04-20 HISTORY — PX: IR DIALY SHUNT INTRO NEEDLE/INTRACATH INITIAL W/IMG LEFT: IMG6102

## 2018-04-20 MED ORDER — IOPAMIDOL (ISOVUE-300) INJECTION 61%
INTRAVENOUS | Status: AC
  Administered 2018-04-20: 30 mL
  Filled 2018-04-20: qty 100

## 2018-04-21 ENCOUNTER — Ambulatory Visit (HOSPITAL_COMMUNITY): Payer: Medicare Other | Attending: Cardiology

## 2018-04-21 ENCOUNTER — Other Ambulatory Visit: Payer: Self-pay

## 2018-04-21 DIAGNOSIS — I2510 Atherosclerotic heart disease of native coronary artery without angina pectoris: Secondary | ICD-10-CM | POA: Insufficient documentation

## 2018-04-21 DIAGNOSIS — B20 Human immunodeficiency virus [HIV] disease: Secondary | ICD-10-CM | POA: Insufficient documentation

## 2018-04-21 DIAGNOSIS — D649 Anemia, unspecified: Secondary | ICD-10-CM | POA: Diagnosis not present

## 2018-04-21 DIAGNOSIS — J449 Chronic obstructive pulmonary disease, unspecified: Secondary | ICD-10-CM | POA: Diagnosis not present

## 2018-04-21 DIAGNOSIS — N186 End stage renal disease: Secondary | ICD-10-CM | POA: Insufficient documentation

## 2018-04-21 DIAGNOSIS — I351 Nonrheumatic aortic (valve) insufficiency: Secondary | ICD-10-CM | POA: Diagnosis not present

## 2018-04-21 DIAGNOSIS — I120 Hypertensive chronic kidney disease with stage 5 chronic kidney disease or end stage renal disease: Secondary | ICD-10-CM | POA: Insufficient documentation

## 2018-04-21 DIAGNOSIS — D631 Anemia in chronic kidney disease: Secondary | ICD-10-CM | POA: Diagnosis not present

## 2018-04-21 DIAGNOSIS — Z992 Dependence on renal dialysis: Secondary | ICD-10-CM | POA: Diagnosis not present

## 2018-04-22 ENCOUNTER — Telehealth: Payer: Self-pay

## 2018-04-22 DIAGNOSIS — Z992 Dependence on renal dialysis: Secondary | ICD-10-CM | POA: Diagnosis not present

## 2018-04-22 DIAGNOSIS — N186 End stage renal disease: Secondary | ICD-10-CM | POA: Diagnosis not present

## 2018-04-22 DIAGNOSIS — D631 Anemia in chronic kidney disease: Secondary | ICD-10-CM | POA: Diagnosis not present

## 2018-04-22 NOTE — Telephone Encounter (Signed)
Notes recorded by Frederik Schmidt, RN on 04/22/2018 at 1:55 PM EDT Spoke to patient's brother (DPR) and informed him of patient's results/recommendations. He verbalized understanding.

## 2018-04-22 NOTE — Telephone Encounter (Signed)
-----   Message from Liliane Shi, [place] sent at 04/22/2018  1:33 PM EDT ----- This study demonstrates:  Normal heart squeeze (ejection fraction) and mild to moderate leakage of the aortic valve (regurgitation).  The left upper chamber (left atrium) is enlarged.   Medication changes / Follow up studies / Other recommendations:   - Continue current medications and follow up as planned.  Please send results to the PCP:  Velna Ochs, MD  Richardson Dopp, PA-C 04/22/2018 1:31 PM

## 2018-04-25 ENCOUNTER — Telehealth: Payer: Self-pay | Admitting: Internal Medicine

## 2018-04-25 DIAGNOSIS — Z992 Dependence on renal dialysis: Secondary | ICD-10-CM | POA: Diagnosis not present

## 2018-04-25 DIAGNOSIS — N186 End stage renal disease: Secondary | ICD-10-CM | POA: Diagnosis not present

## 2018-04-25 DIAGNOSIS — D631 Anemia in chronic kidney disease: Secondary | ICD-10-CM | POA: Diagnosis not present

## 2018-04-25 NOTE — Telephone Encounter (Signed)
Elta Guadeloupe from [facility] [phone]; pt missed last week due to doctors appt, 08/05 pt wasn't feeling well, they will try again tomorrow

## 2018-04-26 DIAGNOSIS — Z992 Dependence on renal dialysis: Secondary | ICD-10-CM | POA: Diagnosis not present

## 2018-04-26 DIAGNOSIS — N186 End stage renal disease: Secondary | ICD-10-CM | POA: Diagnosis not present

## 2018-04-26 DIAGNOSIS — D631 Anemia in chronic kidney disease: Secondary | ICD-10-CM | POA: Diagnosis not present

## 2018-04-28 DIAGNOSIS — N186 End stage renal disease: Secondary | ICD-10-CM | POA: Diagnosis not present

## 2018-04-28 DIAGNOSIS — Z992 Dependence on renal dialysis: Secondary | ICD-10-CM | POA: Diagnosis not present

## 2018-04-28 DIAGNOSIS — D631 Anemia in chronic kidney disease: Secondary | ICD-10-CM | POA: Diagnosis not present

## 2018-04-29 DIAGNOSIS — Z992 Dependence on renal dialysis: Secondary | ICD-10-CM | POA: Diagnosis not present

## 2018-04-29 DIAGNOSIS — D631 Anemia in chronic kidney disease: Secondary | ICD-10-CM | POA: Diagnosis not present

## 2018-04-29 DIAGNOSIS — N186 End stage renal disease: Secondary | ICD-10-CM | POA: Diagnosis not present

## 2018-05-01 ENCOUNTER — Emergency Department (HOSPITAL_COMMUNITY): Payer: Medicare Other

## 2018-05-01 ENCOUNTER — Encounter (HOSPITAL_COMMUNITY): Payer: Self-pay | Admitting: Emergency Medicine

## 2018-05-01 ENCOUNTER — Other Ambulatory Visit: Payer: Self-pay

## 2018-05-01 ENCOUNTER — Inpatient Hospital Stay (HOSPITAL_COMMUNITY)
Admission: EM | Admit: 2018-05-01 | Discharge: 2018-05-06 | DRG: 640 | Disposition: A | Discharge status: Home / Self Care | Payer: Medicare Other | Attending: Internal Medicine | Admitting: Internal Medicine

## 2018-05-01 DIAGNOSIS — Z888 Allergy status to other drugs, medicaments and biological substances status: Secondary | ICD-10-CM

## 2018-05-01 DIAGNOSIS — R0902 Hypoxemia: Secondary | ICD-10-CM | POA: Diagnosis not present

## 2018-05-01 DIAGNOSIS — B181 Chronic viral hepatitis B without delta-agent: Secondary | ICD-10-CM | POA: Diagnosis present

## 2018-05-01 DIAGNOSIS — D696 Thrombocytopenia, unspecified: Secondary | ICD-10-CM | POA: Diagnosis present

## 2018-05-01 DIAGNOSIS — R0602 Shortness of breath: Secondary | ICD-10-CM

## 2018-05-01 DIAGNOSIS — Z21 Asymptomatic human immunodeficiency virus [HIV] infection status: Secondary | ICD-10-CM

## 2018-05-01 DIAGNOSIS — R778 Other specified abnormalities of plasma proteins: Secondary | ICD-10-CM

## 2018-05-01 DIAGNOSIS — S4992XA Unspecified injury of left shoulder and upper arm, initial encounter: Secondary | ICD-10-CM | POA: Diagnosis not present

## 2018-05-01 DIAGNOSIS — B957 Other staphylococcus as the cause of diseases classified elsewhere: Secondary | ICD-10-CM

## 2018-05-01 DIAGNOSIS — I429 Cardiomyopathy, unspecified: Secondary | ICD-10-CM | POA: Diagnosis present

## 2018-05-01 DIAGNOSIS — Z880 Allergy status to penicillin: Secondary | ICD-10-CM

## 2018-05-01 DIAGNOSIS — J449 Chronic obstructive pulmonary disease, unspecified: Secondary | ICD-10-CM | POA: Diagnosis present

## 2018-05-01 DIAGNOSIS — I330 Acute and subacute infective endocarditis: Secondary | ICD-10-CM | POA: Diagnosis present

## 2018-05-01 DIAGNOSIS — B9562 Methicillin resistant Staphylococcus aureus infection as the cause of diseases classified elsewhere: Secondary | ICD-10-CM | POA: Diagnosis not present

## 2018-05-01 DIAGNOSIS — I358 Other nonrheumatic aortic valve disorders: Secondary | ICD-10-CM

## 2018-05-01 DIAGNOSIS — M1990 Unspecified osteoarthritis, unspecified site: Secondary | ICD-10-CM | POA: Diagnosis present

## 2018-05-01 DIAGNOSIS — J189 Pneumonia, unspecified organism: Secondary | ICD-10-CM | POA: Diagnosis not present

## 2018-05-01 DIAGNOSIS — E8770 Fluid overload, unspecified: Principal | ICD-10-CM

## 2018-05-01 DIAGNOSIS — M25512 Pain in left shoulder: Secondary | ICD-10-CM | POA: Diagnosis present

## 2018-05-01 DIAGNOSIS — Z833 Family history of diabetes mellitus: Secondary | ICD-10-CM

## 2018-05-01 DIAGNOSIS — R0600 Dyspnea, unspecified: Secondary | ICD-10-CM | POA: Diagnosis not present

## 2018-05-01 DIAGNOSIS — W19XXXA Unspecified fall, initial encounter: Secondary | ICD-10-CM

## 2018-05-01 DIAGNOSIS — Z9861 Coronary angioplasty status: Secondary | ICD-10-CM

## 2018-05-01 DIAGNOSIS — B9689 Other specified bacterial agents as the cause of diseases classified elsewhere: Secondary | ICD-10-CM | POA: Diagnosis not present

## 2018-05-01 DIAGNOSIS — Z8249 Family history of ischemic heart disease and other diseases of the circulatory system: Secondary | ICD-10-CM | POA: Diagnosis not present

## 2018-05-01 DIAGNOSIS — R7881 Bacteremia: Secondary | ICD-10-CM | POA: Diagnosis present

## 2018-05-01 DIAGNOSIS — I248 Other forms of acute ischemic heart disease: Secondary | ICD-10-CM | POA: Diagnosis present

## 2018-05-01 DIAGNOSIS — Z87891 Personal history of nicotine dependence: Secondary | ICD-10-CM | POA: Diagnosis not present

## 2018-05-01 DIAGNOSIS — Z9115 Patient's noncompliance with renal dialysis: Secondary | ICD-10-CM

## 2018-05-01 DIAGNOSIS — J80 Acute respiratory distress syndrome: Secondary | ICD-10-CM | POA: Diagnosis not present

## 2018-05-01 DIAGNOSIS — I428 Other cardiomyopathies: Secondary | ICD-10-CM | POA: Diagnosis not present

## 2018-05-01 DIAGNOSIS — D649 Anemia, unspecified: Secondary | ICD-10-CM | POA: Diagnosis not present

## 2018-05-01 DIAGNOSIS — R609 Edema, unspecified: Secondary | ICD-10-CM | POA: Diagnosis not present

## 2018-05-01 DIAGNOSIS — G40909 Epilepsy, unspecified, not intractable, without status epilepticus: Secondary | ICD-10-CM | POA: Diagnosis present

## 2018-05-01 DIAGNOSIS — G8921 Chronic pain due to trauma: Secondary | ICD-10-CM | POA: Diagnosis not present

## 2018-05-01 DIAGNOSIS — I351 Nonrheumatic aortic (valve) insufficiency: Secondary | ICD-10-CM | POA: Diagnosis present

## 2018-05-01 DIAGNOSIS — R7989 Other specified abnormal findings of blood chemistry: Secondary | ICD-10-CM | POA: Diagnosis not present

## 2018-05-01 DIAGNOSIS — I340 Nonrheumatic mitral (valve) insufficiency: Secondary | ICD-10-CM | POA: Diagnosis not present

## 2018-05-01 DIAGNOSIS — I2510 Atherosclerotic heart disease of native coronary artery without angina pectoris: Secondary | ICD-10-CM | POA: Diagnosis present

## 2018-05-01 DIAGNOSIS — Z809 Family history of malignant neoplasm, unspecified: Secondary | ICD-10-CM

## 2018-05-01 DIAGNOSIS — N186 End stage renal disease: Secondary | ICD-10-CM | POA: Diagnosis present

## 2018-05-01 DIAGNOSIS — R748 Abnormal levels of other serum enzymes: Secondary | ICD-10-CM | POA: Diagnosis not present

## 2018-05-01 DIAGNOSIS — I120 Hypertensive chronic kidney disease with stage 5 chronic kidney disease or end stage renal disease: Secondary | ICD-10-CM | POA: Diagnosis present

## 2018-05-01 DIAGNOSIS — D631 Anemia in chronic kidney disease: Secondary | ICD-10-CM | POA: Diagnosis present

## 2018-05-01 DIAGNOSIS — Z01810 Encounter for preprocedural cardiovascular examination: Secondary | ICD-10-CM | POA: Diagnosis not present

## 2018-05-01 DIAGNOSIS — Z992 Dependence on renal dialysis: Secondary | ICD-10-CM

## 2018-05-01 DIAGNOSIS — Z885 Allergy status to narcotic agent status: Secondary | ICD-10-CM

## 2018-05-01 DIAGNOSIS — R0601 Orthopnea: Secondary | ICD-10-CM | POA: Diagnosis not present

## 2018-05-01 DIAGNOSIS — G8929 Other chronic pain: Secondary | ICD-10-CM | POA: Diagnosis present

## 2018-05-01 DIAGNOSIS — B1910 Unspecified viral hepatitis B without hepatic coma: Secondary | ICD-10-CM | POA: Diagnosis not present

## 2018-05-01 LAB — I-STAT VENOUS BLOOD GAS, ED
Acid-Base Excess: 3 mmol/L — ABNORMAL HIGH (ref 0.0–2.0)
BICARBONATE: 27.6 mmol/L (ref 20.0–28.0)
O2 Saturation: 61 %
PCO2 VEN: 41.8 mmHg — AB (ref 44.0–60.0)
TCO2: 29 mmol/L (ref 22–32)
pH, Ven: 7.429 (ref 7.250–7.430)
pO2, Ven: 31 mmHg — CL (ref 32.0–45.0)

## 2018-05-01 LAB — BASIC METABOLIC PANEL
Anion gap: 14 (ref 5–15)
BUN: 55 mg/dL — ABNORMAL HIGH (ref 8–23)
CALCIUM: 8.6 mg/dL — AB (ref 8.9–10.3)
CO2: 25 mmol/L (ref 22–32)
CREATININE: 9.27 mg/dL — AB (ref 0.61–1.24)
Chloride: 95 mmol/L — ABNORMAL LOW (ref 98–111)
GFR calc Af Amer: 6 mL/min — ABNORMAL LOW (ref >60)
GFR, EST NON AFRICAN AMERICAN: 5 mL/min — AB (ref >60)
Glucose, Bld: 104 mg/dL — ABNORMAL HIGH (ref 70–99)
Potassium: 3.7 mmol/L (ref 3.5–5.1)
Sodium: 134 mmol/L — ABNORMAL LOW (ref 135–145)

## 2018-05-01 LAB — CBC
HCT: 26.6 % — ABNORMAL LOW (ref 39.0–52.0)
Hemoglobin: 8.1 g/dL — ABNORMAL LOW (ref 13.0–17.0)
MCH: 29.8 pg (ref 26.0–34.0)
MCHC: 30.5 g/dL (ref 30.0–36.0)
MCV: 97.8 fL (ref 78.0–100.0)
PLATELETS: 194 10*3/uL (ref 150–400)
RBC: 2.72 MIL/uL — ABNORMAL LOW (ref 4.22–5.81)
RDW: 18.1 % — AB (ref 11.5–15.5)
WBC: 11.3 10*3/uL — ABNORMAL HIGH (ref 4.0–10.5)

## 2018-05-01 LAB — BRAIN NATRIURETIC PEPTIDE: B Natriuretic Peptide: 909.6 pg/mL — ABNORMAL HIGH (ref 0.0–100.0)

## 2018-05-01 LAB — I-STAT TROPONIN, ED: Troponin i, poc: 0.16 ng/mL (ref 0.00–0.08)

## 2018-05-01 MED ORDER — LEVOFLOXACIN IN D5W 750 MG/150ML IV SOLN
750.0000 mg | Freq: Once | INTRAVENOUS | Status: AC
  Administered 2018-05-01: 750 mg via INTRAVENOUS
  Filled 2018-05-01: qty 150

## 2018-05-01 NOTE — ED Provider Notes (Signed)
[facility] Emergency Department Provider Note MRN:  [id number]  Arrival date & time: 05/02/18     Chief Complaint   Shortness of Breath   History of Present Illness   Jeffrey [name] is a 62 y.o. year-old male with a history of HIV, COPD, ESRD, CAD presenting to the ED with chief complaint of shortness of breath.  Gradual onset shortness of breath that began about a week ago, progressively worsening.  Shortness of breath is located in the chest.  Constant.  Worse with laying flat.  Associated with increased lower extremity edema.  Denies fevers or cough, no chest pain, no abdominal pain.  Does home dialysis, did his normal session yesterday.  Review of Systems  A complete 10 system review of systems was obtained and all systems are negative except as noted in the HPI and PMH.   Patient's Health History    Past Medical History:  Diagnosis Date  . Anemia   . Anxiety   . Arthritis   . Asthma    per pt hx  . End stage renal disease on home HD 07/10/2011   Started HD in September 2012 at [facility] with a tunneled HD catheter, now on home HD with NxtStage. Dialyzing through AVF L lower arm with buttonhole technique as of mid 2014. His brother does the HD treatments at home.  They are roommates for 23 years.  The brother works 3rd shift and gets off about 8am and then puts Jeffrey [name] on HD in the morning after getting home. Most of the time he does HD about 4 times a week, for about 4 hours per treatment. Cause of ESRD was HTN according to patient. He says he let his health go and ending up with complications, and that he didn't like seeing doctors in those days.  He says he was diagnosed with severe HTN when he lived in [place] in his 36's.   . Hepatitis B carrier ([place])   . HIV infection ([place])   . Hypertension   . Hyperthyroidism    normal now  . Pneumonia several yrs ago  . Seizure ([place])   . Seizures ([place]) 06/02/2011   x 1 none since  . Thrombocytopenia ([place])     Past  Surgical History:  Procedure Laterality Date  . AV FISTULA PLACEMENT  06/02/11   Left radiocephalic AVF  . BIOPSY  02/17/2018   Procedure: BIOPSY;  Surgeon: Milus Banister, MD;  Location: [facility] ENDOSCOPY;  Service: Endoscopy;;  . COLONOSCOPY    . COLONOSCOPY WITH PROPOFOL N/A 01/27/2018   Procedure: COLONOSCOPY WITH PROPOFOL;  Surgeon: [name], MD;  Location: [facility] ENDOSCOPY;  Service: Gastroenterology;  Laterality: N/A;  . CORONARY ANGIOPLASTY    . ERCP  03/21/2018   AT [place]  . ESOPHAGOGASTRODUODENOSCOPY N/A 02/17/2018   Procedure: ESOPHAGOGASTRODUODENOSCOPY (EGD);  Surgeon: Milus Banister, MD;  Location: [facility] ENDOSCOPY;  Service: Endoscopy;  Laterality: N/A;  . ESOPHAGOGASTRODUODENOSCOPY (EGD) WITH PROPOFOL N/A 01/27/2018   Procedure: ESOPHAGOGASTRODUODENOSCOPY (EGD) WITH PROPOFOL;  Surgeon: [name], MD;  Location: [facility] ENDOSCOPY;  Service: Gastroenterology;  Laterality: N/A;  . ESOPHAGOGASTRODUODENOSCOPY (EGD) WITH PROPOFOL N/A 04/03/2018   Procedure: ESOPHAGOGASTRODUODENOSCOPY (EGD) WITH PROPOFOL;  Surgeon: Clarene Essex, MD;  Location: [place];  Service: Endoscopy;  Laterality: N/A;  . EUS N/A 02/17/2018   Procedure: UPPER ENDOSCOPIC ULTRASOUND (EUS) RADIAL;  Surgeon: Milus Banister, MD;  Location: [facility] ENDOSCOPY;  Service: Endoscopy;  Laterality: N/A;  . fistulaogram  x 2 last 2 years  . IR DIALY SHUNT INTRO NEEDLE/INTRACATH INITIAL W/IMG LEFT Left 04/20/2018  . IR FLUORO GUIDE CV LINE RIGHT  09/16/2017  . IR US GUIDE VASC ACCESS RIGHT  09/16/2017  . IRRIGATION AND DEBRIDEMENT KNEE Left 09/15/2017   Procedure: IRRIGATION AND DEBRIDEMENT KNEE; arthroscopic clean out;  Surgeon: [name], MD;  Location: [facility] ORS;  Service: Orthopedics;  Laterality: Left;  . OTHER SURGICAL HISTORY     removal temporary HD catheter   . REVISON OF ARTERIOVENOUS FISTULA Left 10/10/2015   Procedure: REVISON OF LEFT RADIOCEPHALIC ARTERIOVENOUS FISTULA;  Surgeon: Angelia Mould, MD;   Location: [place];  Service: Vascular;  Laterality: Left;  . REVISON OF ARTERIOVENOUS FISTULA Left 02/07/2016   Procedure: REPAIR OF PSEUDO-ANEUREYSM OF LEFT ARM  ARTERIOVENOUS FISTULA;  Surgeon: Angelia Mould, MD;  Location: [place];  Service: Vascular;  Laterality: Left;  . REVISON OF ARTERIOVENOUS FISTULA Left 3/76/2831   Procedure: PLICATION OF LEFT ARM RADIOCEPHALIC ARTERIOVENOUS FISTULA PSEUDOANEURYSM;  Surgeon: Angelia Mould, MD;  Location: [place];  Service: Vascular;  Laterality: Left;  . RIGHT/LEFT HEART CATH AND CORONARY ANGIOGRAPHY N/A 02/17/2017   Procedure: Right/Left Heart Cath and Coronary Angiography;  Surgeon: Sherren Mocha, MD;  Location: [place] CV LAB;  Service: Cardiovascular;  Laterality: N/A;  . TEE WITHOUT CARDIOVERSION N/A 09/11/2016   Procedure: TRANSESOPHAGEAL ECHOCARDIOGRAM (TEE);  Surgeon: Dorothy Spark, MD;  Location: [facility] ENDOSCOPY;  Service: Cardiovascular;  Laterality: N/A;    Family History  Problem Relation Age of Onset  . Hypertension Mother   . Diabetes Mother   . Cancer Father     Social History   Socioeconomic History  . Marital status: Single    Spouse name: Not on file  . Number of children: Not on file  . Years of education: Not on file  . Highest education level: Not on file  Occupational History  . Not on file  Social Needs  . Financial resource strain: Not on file  . Food insecurity:    Worry: Not on file    Inability: Not on file  . Transportation needs:    Medical: Not on file    Non-medical: Not on file  Tobacco Use  . Smoking status: Former Smoker    Years: 10.00    Types: Cigarettes    Last attempt to quit: 08/08/2015    Years since quitting: 2.7  . Smokeless tobacco: Never Used  . Tobacco comment: casual smoking for 10 years  Substance and Sexual Activity  . Alcohol use: No    Alcohol/week: 0.0 standard drinks    Comment: quit 2004  . Drug use: No    Comment: uds (+) cocaine in 08/2011 but pt states was  taking sudafed at the time  . Sexual activity: Not Currently    Partners: Male  Lifestyle  . Physical activity:    Days per week: Not on file    Minutes per session: Not on file  . Stress: Not on file  Relationships  . Social connections:    Talks on phone: Not on file    Gets together: Not on file    Attends religious service: Not on file    Active member of club or organization: Not on file    Attends meetings of clubs or organizations: Not on file    Relationship status: Not on file  . Intimate partner violence:    Fear of current or ex partner: Not on file  Emotionally abused: Not on file    Physically abused: Not on file    Forced sexual activity: Not on file  Other Topics Concern  . 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    From [place] Signs and Nursing Notes reviewed Vitals:   05/02/18 0000 05/02/18 0050  BP: 110/85 (!) 126/97  Pulse: 81 89  Resp: 19 18  Temp:  98.9 F (37.2 C)  SpO2:  100%    CONSTITUTIONAL: Chronically ill-appearing, NAD NEURO:  Alert and oriented x 3, no focal deficits EYES:  eyes equal and reactive ENT/NECK:  no LAD, no JVD CARDIO: Regular rate, well-perfused, normal S1 and S2 PULM: Decreased breath sounds bilateral bases GI/GU:  normal bowel sounds, non-distended, non-tender MSK/SPINE:  No gross deformities, no edema SKIN:  no rash, atraumatic PSYCH:  Appropriate speech and behavior  Diagnostic and Interventional Summary    EKG Interpretation  Date/Time:    Ventricular Rate:    PR Interval:    QRS Duration:   QT Interval:    QTC Calculation:   R Axis:     Text Interpretation:        Labs Reviewed  CBC - Abnormal; Notable for the following components:      Result Value   WBC 11.3 (*)    RBC 2.72 (*)    Hemoglobin 8.1 (*)    HCT 26.6 (*)     RDW 18.1 (*)    All other components within normal limits  BASIC METABOLIC PANEL - Abnormal; Notable for the following components:   Sodium 134 (*)    Chloride 95 (*)    Glucose, Bld 104 (*)    BUN 55 (*)    Creatinine, Ser 9.27 (*)    Calcium 8.6 (*)    GFR calc non Af Amer 5 (*)    GFR calc Af Amer 6 (*)    All other components within normal limits  BRAIN NATRIURETIC PEPTIDE - Abnormal; Notable for the following components:   B Natriuretic Peptide 909.6 (*)    All other components within normal limits  I-STAT TROPONIN, ED - Abnormal; Notable for the following components:   Troponin i, poc 0.16 (*)    All other components within normal limits  I-STAT VENOUS BLOOD GAS, ED - Abnormal; Notable for the following components:   pCO2, Ven 41.8 (*)    pO2, Ven 31.0 (*)    Acid-Base Excess 3.0 (*)    All other components within normal limits  CULTURE, BLOOD (ROUTINE X 2)  CULTURE, BLOOD (ROUTINE X 2)  TROPONIN I  RENAL FUNCTION PANEL    DG Chest Port 1 View  Final Result    DG Chest 2 View    (Results Pending)  DG Shoulder Left    (Results Pending)    Medications  bictegravir-emtricitabine-tenofovir AF (BIKTARVY) 50-200-25 MG per tablet 1 tablet (has no administration in time range)  albuterol (PROVENTIL) (2.5 MG/3ML) 0.083% nebulizer solution 2.5 mg (has no administration in time range)  calcium acetate (PHOSLO) capsule 667 mg (has no administration in time range)  carvedilol (COREG) tablet 25 mg (has no administration in time range)  cloNIDine (CATAPRES - Dosed in mg/24 hr) patch 0.2 mg (has no administration in time range)  lacosamide (VIMPAT) tablet  75 mg (has no administration in time range)  megestrol (MEGACE) tablet 40 mg (has no administration in time range)  pantoprazole (PROTONIX) EC tablet 40 mg (has no administration in time range)  multivitamin (RENA-VIT) tablet 1 tablet (has no administration in time range)  traZODone (DESYREL) tablet 50 mg (has no administration in  time range)  calcium acetate (PHOSLO) capsule 1,334 mg (has no administration in time range)  heparin injection 5,000 Units (has no administration in time range)  HYDROcodone-acetaminophen (NORCO/VICODIN) 5-325 MG per tablet 1-2 tablet (has no administration in time range)  levofloxacin (LEVAQUIN) IVPB 750 mg (750 mg Intravenous New Bag/Given 05/01/18 2322)     Procedures Critical Care  ED Course and Medical Decision Making  I have reviewed the triage vital signs and the nursing notes.  Pertinent labs & imaging results that were available during my care of the patient were reviewed by me and considered in my medical decision making (see below for details).    62 year old male history of ESRD complaining of orthopnea, increased lower extremity edema.  Vital signs stable, increased work of breathing, given trial of CPAP for comfort.  X-ray reveals likely pneumonia.  Given levofloxacin due to insulin allergy.  Labs revealed troponin elevation, favored type II NSTEMI.  Consulted with nephrology as well as internal medicine, admitted for continued care and dialysis.  Barth Kirks. Sedonia Small, [place] [email or web address]  Final Clinical Impressions(s) / ED Diagnoses     ICD-10-CM   1. Elevated troponin R74.8   2. SOB (shortness of breath) R06.02 DG Chest [facility] 1 View    DG Chest [place] 1 View  3. Dyspnea R06.00 DG Chest 2 View    DG Chest 2 View  4. Fall W19.XXXA DG Shoulder Left    DG Shoulder Left  5. Shoulder pain, left M25.512 DG Shoulder Left    DG Shoulder Left  6. Community acquired pneumonia, unspecified laterality J18.9     ED Discharge Orders    None         Maudie Flakes, MD 05/02/18 [phone]

## 2018-05-01 NOTE — ED Notes (Signed)
XR at bedside

## 2018-05-01 NOTE — ED Triage Notes (Signed)
Brought by ems from home for c/o increased SOB for the last week worse today.  Endorses doing home hemodialysis yesterday without any issues.  Given 1 ntg en route.  Some dyspnea noted on arrival.

## 2018-05-01 NOTE — ED Notes (Signed)
Dr Sedonia Small informed of pt troponin .16 and venous blood gas results

## 2018-05-02 ENCOUNTER — Inpatient Hospital Stay (HOSPITAL_COMMUNITY): Payer: Medicare Other

## 2018-05-02 ENCOUNTER — Other Ambulatory Visit: Payer: Self-pay

## 2018-05-02 ENCOUNTER — Encounter (HOSPITAL_COMMUNITY): Payer: Self-pay | Admitting: Nephrology

## 2018-05-02 ENCOUNTER — Ambulatory Visit: Payer: Medicare Other

## 2018-05-02 DIAGNOSIS — Z720 Tobacco use: Secondary | ICD-10-CM

## 2018-05-02 DIAGNOSIS — Z8249 Family history of ischemic heart disease and other diseases of the circulatory system: Secondary | ICD-10-CM

## 2018-05-02 DIAGNOSIS — Z21 Asymptomatic human immunodeficiency virus [HIV] infection status: Secondary | ICD-10-CM

## 2018-05-02 DIAGNOSIS — Z9889 Other specified postprocedural states: Secondary | ICD-10-CM

## 2018-05-02 DIAGNOSIS — G40909 Epilepsy, unspecified, not intractable, without status epilepticus: Secondary | ICD-10-CM

## 2018-05-02 DIAGNOSIS — D649 Anemia, unspecified: Secondary | ICD-10-CM

## 2018-05-02 DIAGNOSIS — E8770 Fluid overload, unspecified: Secondary | ICD-10-CM

## 2018-05-02 DIAGNOSIS — Z8719 Personal history of other diseases of the digestive system: Secondary | ICD-10-CM

## 2018-05-02 DIAGNOSIS — R609 Edema, unspecified: Secondary | ICD-10-CM

## 2018-05-02 DIAGNOSIS — I440 Atrioventricular block, first degree: Secondary | ICD-10-CM

## 2018-05-02 DIAGNOSIS — N186 End stage renal disease: Secondary | ICD-10-CM

## 2018-05-02 DIAGNOSIS — G8921 Chronic pain due to trauma: Secondary | ICD-10-CM

## 2018-05-02 DIAGNOSIS — R748 Abnormal levels of other serum enzymes: Secondary | ICD-10-CM

## 2018-05-02 DIAGNOSIS — Z992 Dependence on renal dialysis: Secondary | ICD-10-CM

## 2018-05-02 DIAGNOSIS — Z79899 Other long term (current) drug therapy: Secondary | ICD-10-CM

## 2018-05-02 LAB — BLOOD CULTURE ID PANEL (REFLEXED)
ACINETOBACTER BAUMANNII: NOT DETECTED
CANDIDA ALBICANS: NOT DETECTED
CANDIDA PARAPSILOSIS: NOT DETECTED
Candida glabrata: NOT DETECTED
Candida krusei: NOT DETECTED
Candida tropicalis: NOT DETECTED
ENTEROBACTERIACEAE SPECIES: NOT DETECTED
ENTEROCOCCUS SPECIES: NOT DETECTED
Enterobacter cloacae complex: NOT DETECTED
Escherichia coli: NOT DETECTED
HAEMOPHILUS INFLUENZAE: NOT DETECTED
KLEBSIELLA OXYTOCA: NOT DETECTED
Klebsiella pneumoniae: NOT DETECTED
LISTERIA MONOCYTOGENES: NOT DETECTED
METHICILLIN RESISTANCE: DETECTED — AB
NEISSERIA MENINGITIDIS: NOT DETECTED
PSEUDOMONAS AERUGINOSA: NOT DETECTED
Proteus species: NOT DETECTED
SERRATIA MARCESCENS: NOT DETECTED
STAPHYLOCOCCUS AUREUS BCID: NOT DETECTED
STREPTOCOCCUS AGALACTIAE: NOT DETECTED
STREPTOCOCCUS SPECIES: NOT DETECTED
Staphylococcus species: DETECTED — AB
Streptococcus pneumoniae: NOT DETECTED
Streptococcus pyogenes: NOT DETECTED

## 2018-05-02 LAB — RENAL FUNCTION PANEL
ALBUMIN: 2.4 g/dL — AB (ref 3.5–5.0)
Anion gap: 16 — ABNORMAL HIGH (ref 5–15)
BUN: 58 mg/dL — AB (ref 8–23)
CALCIUM: 8.3 mg/dL — AB (ref 8.9–10.3)
CO2: 22 mmol/L (ref 22–32)
Chloride: 98 mmol/L (ref 98–111)
Creatinine, Ser: 9.62 mg/dL — ABNORMAL HIGH (ref 0.61–1.24)
GFR calc Af Amer: 6 mL/min — ABNORMAL LOW (ref >60)
GFR, EST NON AFRICAN AMERICAN: 5 mL/min — AB (ref >60)
GLUCOSE: 120 mg/dL — AB (ref 70–99)
PHOSPHORUS: 2.5 mg/dL (ref 2.5–4.6)
POTASSIUM: 3.7 mmol/L (ref 3.5–5.1)
SODIUM: 136 mmol/L (ref 135–145)

## 2018-05-02 LAB — MRSA PCR SCREENING: MRSA BY PCR: NEGATIVE

## 2018-05-02 LAB — TROPONIN I
Troponin I: 0.22 ng/mL (ref <0.03)
Troponin I: 0.17 ng/mL (ref <0.03)

## 2018-05-02 MED ORDER — LACOSAMIDE 50 MG PO TABS
25.0000 mg | ORAL_TABLET | ORAL | Status: DC
  Administered 2018-05-02 – 2018-05-06 (×3): 25 mg via ORAL
  Filled 2018-05-02 (×3): qty 1

## 2018-05-02 MED ORDER — CHLORHEXIDINE GLUCONATE CLOTH 2 % EX PADS
6.0000 | MEDICATED_PAD | Freq: Every day | CUTANEOUS | Status: DC
  Administered 2018-05-03 – 2018-05-04 (×2): 6 via TOPICAL

## 2018-05-02 MED ORDER — HYDROCODONE-ACETAMINOPHEN 5-325 MG PO TABS
1.0000 | ORAL_TABLET | Freq: Four times a day (QID) | ORAL | Status: DC | PRN
  Administered 2018-05-02 – 2018-05-06 (×11): 2 via ORAL
  Filled 2018-05-02 (×10): qty 2

## 2018-05-02 MED ORDER — VANCOMYCIN HCL 10 G IV SOLR
1500.0000 mg | Freq: Once | INTRAVENOUS | Status: AC
  Administered 2018-05-02: 1500 mg via INTRAVENOUS
  Filled 2018-05-02: qty 1500

## 2018-05-02 MED ORDER — CALCIUM ACETATE (PHOS BINDER) 667 MG PO CAPS
1334.0000 mg | ORAL_CAPSULE | Freq: Three times a day (TID) | ORAL | Status: DC
  Administered 2018-05-02 – 2018-05-05 (×8): 1334 mg via ORAL
  Filled 2018-05-02 (×10): qty 2

## 2018-05-02 MED ORDER — CLOTRIMAZOLE-BETAMETHASONE 1-0.05 % EX CREA
1.0000 "application " | TOPICAL_CREAM | Freq: Two times a day (BID) | CUTANEOUS | Status: DC
  Administered 2018-05-02 – 2018-05-05 (×7): 1 via TOPICAL
  Filled 2018-05-02: qty 15

## 2018-05-02 MED ORDER — SODIUM CHLORIDE 0.9 % IV SOLN
25.0000 mg | Freq: Once | INTRAVENOUS | Status: DC
  Filled 2018-05-02: qty 0.5

## 2018-05-02 MED ORDER — DIPHENHYDRAMINE HCL 25 MG PO CAPS
ORAL_CAPSULE | ORAL | Status: AC
  Administered 2018-05-02: 25 mg
  Filled 2018-05-02: qty 1

## 2018-05-02 MED ORDER — HYDRALAZINE HCL 50 MG PO TABS: 50.0000 mg | ORAL_TABLET | Freq: Three times a day (TID) | ORAL | Status: DC | PRN

## 2018-05-02 MED ORDER — CARVEDILOL 25 MG PO TABS
25.0000 mg | ORAL_TABLET | Freq: Two times a day (BID) | ORAL | Status: DC
  Administered 2018-05-02 – 2018-05-05 (×6): 25 mg via ORAL
  Filled 2018-05-02 (×7): qty 1

## 2018-05-02 MED ORDER — CALCIUM ACETATE (PHOS BINDER) 667 MG PO CAPS: 667.0000 mg | ORAL_CAPSULE | ORAL | Status: DC | PRN

## 2018-05-02 MED ORDER — VANCOMYCIN HCL IN DEXTROSE 750-5 MG/150ML-% IV SOLN
750.0000 mg | INTRAVENOUS | Status: DC
  Filled 2018-05-02: qty 150

## 2018-05-02 MED ORDER — HEPARIN SODIUM (PORCINE) 5000 UNIT/ML IJ SOLN
5000.0000 [IU] | Freq: Three times a day (TID) | INTRAMUSCULAR | Status: DC
  Administered 2018-05-02 – 2018-05-05 (×7): 5000 [IU] via SUBCUTANEOUS
  Filled 2018-05-02 (×8): qty 1

## 2018-05-02 MED ORDER — PANTOPRAZOLE SODIUM 40 MG PO TBEC
40.0000 mg | DELAYED_RELEASE_TABLET | Freq: Every day | ORAL | Status: DC
  Administered 2018-05-02 – 2018-05-06 (×5): 40 mg via ORAL
  Filled 2018-05-02 (×5): qty 1

## 2018-05-02 MED ORDER — RENA-VITE PO TABS
1.0000 | ORAL_TABLET | Freq: Every day | ORAL | Status: DC
  Administered 2018-05-02 – 2018-05-05 (×4): 1 via ORAL
  Filled 2018-05-02 (×4): qty 1

## 2018-05-02 MED ORDER — HYDROCODONE-ACETAMINOPHEN 5-325 MG PO TABS
ORAL_TABLET | ORAL | Status: AC
  Filled 2018-05-02: qty 2

## 2018-05-02 MED ORDER — LACOSAMIDE 50 MG PO TABS
75.0000 mg | ORAL_TABLET | Freq: Two times a day (BID) | ORAL | Status: DC
  Administered 2018-05-02 – 2018-05-05 (×9): 75 mg via ORAL
  Filled 2018-05-02 (×9): qty 2

## 2018-05-02 MED ORDER — HYDROCODONE-ACETAMINOPHEN 5-325 MG PO TABS: 1.0000 | ORAL_TABLET | Freq: Four times a day (QID) | ORAL | Status: DC | PRN

## 2018-05-02 MED ORDER — BICTEGRAVIR-EMTRICITAB-TENOFOV 50-200-25 MG PO TABS
1.0000 | ORAL_TABLET | Freq: Every day | ORAL | Status: DC
  Administered 2018-05-02 – 2018-05-06 (×5): 1 via ORAL
  Filled 2018-05-02 (×5): qty 1

## 2018-05-02 MED ORDER — CLONIDINE HCL 0.2 MG/24HR TD PTWK
0.2000 mg | MEDICATED_PATCH | TRANSDERMAL | Status: DC
Start: 2018-05-03 — End: 2018-05-06
  Administered 2018-05-03: 0.2 mg via TRANSDERMAL
  Filled 2018-05-02: qty 1

## 2018-05-02 MED ORDER — HYDRALAZINE HCL 100 MG PO TABS: 25.0000 mg | ORAL_TABLET | Freq: Three times a day (TID) | ORAL | Status: DC

## 2018-05-02 MED ORDER — UMECLIDINIUM-VILANTEROL 62.5-25 MCG/INH IN AEPB
1.0000 | INHALATION_SPRAY | Freq: Every day | RESPIRATORY_TRACT | Status: DC
  Filled 2018-05-02: qty 14

## 2018-05-02 MED ORDER — ALBUTEROL SULFATE (2.5 MG/3ML) 0.083% IN NEBU: 2.5000 mg | INHALATION_SOLUTION | Freq: Four times a day (QID) | RESPIRATORY_TRACT | Status: DC | PRN

## 2018-05-02 MED ORDER — HEPARIN SODIUM (PORCINE) 5000 UNIT/ML IJ SOLN: 5000.0000 [IU] | Freq: Three times a day (TID) | INTRAMUSCULAR | Status: DC

## 2018-05-02 MED ORDER — CARVEDILOL 25 MG PO TABS: 25.0000 mg | ORAL_TABLET | Freq: Two times a day (BID) | ORAL | Status: DC

## 2018-05-02 MED ORDER — ORAL CARE MOUTH RINSE
15.0000 mL | Freq: Two times a day (BID) | OROMUCOSAL | Status: DC
  Administered 2018-05-02 – 2018-05-05 (×6): 15 mL via OROMUCOSAL

## 2018-05-02 MED ORDER — HYDRALAZINE HCL 50 MG PO TABS
50.0000 mg | ORAL_TABLET | Freq: Three times a day (TID) | ORAL | Status: DC
  Administered 2018-05-02 – 2018-05-03 (×2): 50 mg via ORAL
  Filled 2018-05-02 (×2): qty 1

## 2018-05-02 MED ORDER — TRAZODONE HCL 50 MG PO TABS
50.0000 mg | ORAL_TABLET | Freq: Every evening | ORAL | Status: DC | PRN
  Administered 2018-05-02 – 2018-05-05 (×4): 50 mg via ORAL
  Filled 2018-05-02 (×4): qty 1

## 2018-05-02 MED ORDER — MEGESTROL ACETATE 40 MG PO TABS
40.0000 mg | ORAL_TABLET | Freq: Every day | ORAL | Status: DC
  Administered 2018-05-03 – 2018-05-05 (×3): 40 mg via ORAL
  Filled 2018-05-02 (×5): qty 1

## 2018-05-02 MED ORDER — UMECLIDINIUM-VILANTEROL 62.5-25 MCG/INH IN AEPB
1.0000 | INHALATION_SPRAY | Freq: Every day | RESPIRATORY_TRACT | Status: DC
Start: 2018-05-03 — End: 2018-05-06
  Administered 2018-05-03 – 2018-05-04 (×2): 1 via RESPIRATORY_TRACT
  Filled 2018-05-02: qty 14

## 2018-05-02 NOTE — Progress Notes (Signed)
Pharmacy Antibiotic Note  Jeffrey [name] is a 62 y.o. male admitted on 05/01/2018 with bacteremia.  Pharmacy has been consulted for vancomycin dosing. Of note patient has a h/o ESRD on HD and received HD today. Patient grew coagulase negative staph in 2/4 bottles (anerobic bottle of each set). Given than patient has a vascath on the arm that the cultures were drawn from, the treatment has decided to treat due to concern for true infection.   Plan: -Vancomycin 1500 mg IV once, then vancomycin 750 mg IV with each HD session -Monitor CBC, cultures, ECHO, clinical progress  Height: 6' (182.9 cm) Weight: 160 lb 15 oz (73 kg) IBW/kg (Calculated) : 77.6  Temp (24hrs), Avg:98.4 F (36.9 C), Min:98 F (36.7 C), Max:99.4 F (37.4 C)  Recent Labs  Lab 05/01/18 2117 05/02/18 0409  WBC 11.3*  --   CREATININE 9.27* 9.62*    Estimated Creatinine Clearance: 8 mL/min (A) (by C-G formula based on SCr of 9.62 mg/dL (H)).    Allergies  Allergen Reactions  . Lisinopril Anaphylaxis and Shortness Of Breath    Throat swelling  . Penicillins Anaphylaxis and Other (See Comments)    Childhood allergy Has patient had a PCN reaction causing immediate rash, facial/tongue/throat swelling, SOB or lightheadedness with hypotension: Yes Has patient had a PCN reaction causing severe rash involving mucus membranes or skin necrosis: Unk Has patient had a PCN reaction that required hospitalization: Unk Has patient had a PCN reaction occurring within the last 10 years: No If all of the above answers are "NO", then may proceed with Cephalosporin use.   Marland Kitchen Morphine And Related Other (See Comments)    "Not Himself"    Antimicrobials this admission: Vancomycin 8/12 >>   Dose adjustments this admission: None   Microbiology results: 8/12 BCx: 2/4 CONS    Thank you for allowing pharmacy to be a part of this patient's care.  Albertina Parr, PharmD., BCPS Clinical Pharmacist Clinical phone for 05/02/18 until  3:30pm: [phone] If after 3:30pm, please refer to [facility] for unit-specific pharmacist

## 2018-05-02 NOTE — H&P (Addendum)
Date: 05/02/2018               Patient Name:  Jeffrey [name] MRN: [id number]  DOB: August 20, 1956 Age / Sex: 62 y.o., male   PCP: Velna Ochs, MD         Medical Service: Internal Medicine Teaching Service         Attending Physician: Dr. Oval Linsey, MD    First Contact: Dr. Laural Golden, [place] Pager: [phone]  Second Contact: Dr. Einar Gip Pager: [phone]       After Hours (After 5p/  First Contact Pager: [phone]  weekends / holidays): Second Contact Pager: [phone]   Chief Complaint: Shortness of breath  History of Present Illness: 62 year old male with past medical history of ESRD (on home dialysis 4 times a week) hypertension (with Hx of hypertensive encephalopathy), HIV, Hep B, Non obstructive CAD, GI bleeding, anemia and duodenal mass (status post partial removal), presented with worsening of shortness of breath. He has had gradual worsening of shortness of breath since last couple of days that progressed to svere dyspnea and orthopnea on the day before admission.He used his PRN inhaler that was partially helpful. Patient has hemodialysis at home with multiple admissions due to volume overload and uremia. His last hemodialysis was yesterday at home. Patient did not have any fever or cough. Also denies any nausea or vomiting, chest pain, dizziness, syncope.  However he endorses bilateral ankle swelling recently. No leg pain is reported.   Meds:  Current Meds  Medication Sig  . albuterol (PROVENTIL) (2.5 MG/3ML) 0.083% nebulizer solution Take 2.5 mg by nebulization every 6 (six) hours as needed for wheezing or shortness of breath.  . bictegravir-emtricitabine-tenofovir AF (BIKTARVY) 50-200-25 MG TABS tablet Take 1 tablet by mouth daily.  . calcium acetate (PHOSLO) 667 MG capsule Take 667-2,001 mg by mouth See admin instructions. Take 1,334 mg three times a day with each meal and 667 mg with each snack  . cloNIDine (CATAPRES - DOSED IN MG/24 HR) 0.2 mg/24hr patch Place 1 patch  (0.2 mg total) onto the skin once a week.  . clotrimazole-betamethasone (LOTRISONE) cream Apply 1 application topically 2 (two) times daily.  Marland Kitchen epoetin alfa (EPOGEN,PROCRIT) 2000 UNIT/ML injection Inject 2,000 Units into the vein 3 (three) times a week.   . ethyl chloride spray Apply 1 application topically daily as needed (HD- to numb).   . heparin 1000 UNIT/ML injection Inject 3,000 Units into the vein 4 (four) times a week.   . hydrALAZINE (APRESOLINE) 100 MG tablet Take 1 tablet (100 mg total) by mouth 3 (three) times daily.  Marland Kitchen HYDROcodone-acetaminophen (NORCO/VICODIN) 5-325 MG tablet Take 1 tablet by mouth every 6 (six) hours as needed. (Patient taking differently: Take 1 tablet by mouth every 6 (six) hours as needed for moderate pain. )  . Ipratropium-Albuterol (COMBIVENT IN) Inhale 2 puffs into the lungs every 6 (six) hours as needed (as needed for shortness of breath).   . iron sucrose (VENOFER) 20 MG/ML injection Inject 100 mg into the vein every Monday.   . lacosamide (VIMPAT) 50 MG TABS tablet Take 1.5 tablets (75 mg total) by mouth 2 (two) times daily.  . megestrol (MEGACE) 40 MG tablet Take 2 tablets (80 mg total) by mouth 2 (two) times daily. (Patient taking differently: Take 40 mg by mouth daily. )  . multivitamin (RENA-VIT) TABS tablet Take 1 tablet by mouth daily.    Marland Kitchen omeprazole (PRILOSEC) 20 MG capsule Take 1 capsule (20 mg total) by  mouth daily.  . traZODone (DESYREL) 50 MG tablet TAKE 1 TABLET(50 MG) BY MOUTH AT BEDTIME AS NEEDED FOR SLEEP (Patient taking differently: Take 50 mg by mouth at bedtime as needed for sleep. )  . VOLTAREN 1 % GEL APPLY 2 GRAMS EXTERNALLY TO THE AFFECTED AREA FOUR TIMES DAILY (Patient taking differently: Apply 2 g topically 3 (three) times daily as needed (pain). )     Allergies: Allergies as of 05/01/2018 - Review Complete 05/01/2018  Allergen Reaction Noted  . Lisinopril Anaphylaxis and Shortness Of Breath 08/31/2012  . Penicillins Anaphylaxis  and Other (See Comments) 03/14/2018  . Morphine and related Other (See Comments) 05/01/2018   Past Medical History:  Diagnosis Date  . Anemia   . Anxiety   . Arthritis   . Asthma    per pt hx  . End stage renal disease on home HD 07/10/2011   Started HD in September 2012 at [facility] with a tunneled HD catheter, now on home HD with NxtStage. Dialyzing through AVF L lower arm with buttonhole technique as of mid 2014. His brother does the HD treatments at home.  They are roommates for 23 years.  The brother works 3rd shift and gets off about 8am and then puts Mr Kean on HD in the morning after getting home. Most of the time he does HD about 4 times a week, for about 4 hours per treatment. Cause of ESRD was HTN according to patient. He says he let his health go and ending up with complications, and that he didn't like seeing doctors in those days.  He says he was diagnosed with severe HTN when he lived in [place] in his 8's.   . Hepatitis B carrier ([place])   . HIV infection ([place])   . Hypertension   . Hyperthyroidism    normal now  . Pneumonia several yrs ago  . Seizure ([place])   . Seizures ([place]) 06/02/2011   x 1 none since  . Thrombocytopenia ([place])     Family History:  HTN in mother DM in mother Cancer in mother  Social History:  Smoke 1 cigaret per day No alcohol No drug  Review of Systems: A complete ROS was negative except as per HPI.   Physical Exam: Blood pressure (!) 126/97, pulse 89, temperature 98.9 F (37.2 C), temperature source Oral, resp. rate 18, height 6' (1.829 m), SpO2 100 %. Physical Exam  Constitutional: He is oriented to person, place, and time. No distress.  Neck: No hepatojugular reflux and no JVD present.  Cardiovascular: Normal rate, regular rhythm and normal heart sounds.  No murmur heard. Pulmonary/Chest: Effort normal. No accessory muscle usage. No tachypnea. No respiratory distress. He has wheezes. He has rales mostly at the left lower field.    Abdominal: Soft. Bowel sounds are normal. There is no tenderness. There is no guarding.  Musculoskeletal:       Right lower leg: He exhibits pitting edema. No tenderness      Left lower leg: He exhibits pitting edema. No tenderness Left hand  Edema. Thrill at AV fistulat Neurological: He is alert and oriented to person, place, and time.  left hand swelling   CBC Latest Ref Rng & Units 05/01/2018 04/18/2018 04/17/2018  WBC 4.0 - 10.5 K/uL 11.3(H) - 8.7  Hemoglobin 13.0 - 17.0 g/dL 8.1(L) 8.8(L) 9.4(L)  Hematocrit 39.0 - 52.0 % 26.6(L) 26.0(L) 30.6(L)  Platelets 150 - 400 K/uL 194 - 155   BMP Latest Ref Rng & Units 05/01/2018  04/18/2018 04/17/2018  Glucose 70 - 99 mg/dL 104(H) 86 103(H)  BUN 8 - 23 mg/dL 55(H) 49(H) 38(H)  Creatinine 0.61 - 1.24 mg/dL 9.27(H) 10.30(H) 7.66(H)  BUN/Creat Ratio 6 - 22 (calc) - - -  Sodium 135 - 145 mmol/L 134(L) 135 136  Potassium 3.5 - 5.1 mmol/L 3.7 4.4 3.8  Chloride 98 - 111 mmol/L 95(L) 98 99  CO2 22 - 32 mmol/L 25 - 25  Calcium 8.9 - 10.3 mg/dL 8.6(L) - 8.7(L)  Trop:1.16 EKG: personally reviewed my interpretation is first degree AV block and old ST-T changes consistent with previous EKG.   CXR: personally reviewed my interpretation is cardiomegaly, left pleural effusion, consistent with previous CXR  Assessment & Plan by Problem: Active Problems:   ESRD needing dialysis ([place])  62 year old male with past medical history of ESRD (on home dialysis 4 times a week) hypertension (with Hx of hypertensive encephalopathy), HIV, Hep B, Non obstructive CAD, GI bleeding, anemia and duodenal mass (status post partial removal), presented with worsening of shortness of breath.  #Shortness of breath: More Likely 2/2 volume overload (in setting of ESRD) than COPD exacerbation. Exam consistnat with volume overload. With multiple admissions due to volume overload.Today weight 78.9 (Kg Dry weight : 76Kg) No Hx of cough or fever. Mild leucocytosis.  Last Echo on 04/21/2018  EF: 55-60%, mild to moderate AR, severely dilated LA, possible left ventricular non-compaction (trabeculation)  BNP: 909   (1944 2 weeks ago) Elevated Troponin: 0.16, EKG nonsignificant  -Nephro consult for inpatient HD  -Renal function panel tomorrow -Cardiac monitoring  #COPD: Last PFT on 2014.at room air at home. patient presented with worsening shortness of breath, denies fever, cough, increased sputum.SOB improved partially with inhaler at home.  Mild leucocytosis, No acute change in CXR that suggest pneumonia. He got 1 dose of Levofloxacin at ED. Less likely to be COPD exacerbation/pneumonia. -Continue Albuterol inhaler q6h PRN  #Aortic insufficiency: He follows up with cardiology, improved based on last echo.  #I-stat Troponin: 0.16 (higher than baseline). No hest pain, but reports some discomfort at the time of dyspnea since he was trying to catch the breath. -Trend Troponin   # Left hand edema: at the same side of AV fistula. No tenderness, no ischemia, no disocolaration. Hand is warm. AV fistula has thrill, no tenderness, no swelling at side of AV fistualla. -Follow up the edema post HD, consider U/S if worsened  #HTN: Normotensive/soft BP on this admission. Mostly hypertensive previously. Hydralazin dose had been increased on previous admission. -Hydralazin 50 mg TID, add PRN if SBP>160 or DBP>110 -Monitor BP -Continue transdermal Clonidine -Carvedilol 25 mg BID  #Shoulder pain: Chronic after fall, worsened recently -Left shoulder Xray -Norco-Vicodin PRN  # HIV: Last CD4 6 months ago was 390 -Continue Biktarvey 50-200-25 1 tab QD -Continue Megestrol 40 mg QD   # Seizure: Had seizure after HD on 7/22 and started on Vimpat. -Continue Vimpat  Diet: Renal with fluid restriction IV fluid: None VTE PPX: Heparin Code status: Full code    Dispo: Admit patient to Inpatient with expected length of stay greater than 2 midnights.  Signed: Dewayne Hatch,  MD 05/02/2018, 4:04 AM  [id number]:[phone]

## 2018-05-02 NOTE — Progress Notes (Signed)
CRITICAL LAB VALUE TROPONIN: 0.22  On call MD, [place], notified.

## 2018-05-02 NOTE — Progress Notes (Signed)
Patient of BIPAP for trial. MD aware. Currently on RA 100% Spo2. No distress noted.

## 2018-05-02 NOTE — Consult Note (Addendum)
Renal Service Consult Note [facility]  Jeffrey [name] 05/02/2018 Jeffrey [name] Requesting Physician:  Dr Eppie Gibson  Reason for Consult:  Home HD patient here w/  HPI: The patient is a 62 y.o. year-old w/ hx of HTN, hep B , HIV and ESRD on home HD f/b nephrology in [place], [place].  Pt presented w/ c/o dyspnea for the last week, worse today.  Had home HD yesterday w/o any issues.  Asked to see for esrd.   Patient thinks he has lost body wt and dry wt needs to be adjusted.  States dry wt is currently 76kg but hasn't seen his kidney doctor in " a while".  No purulent sputum , no fevers or chills, no chest pains.  Minimal cough, whitish secretions.  No abd pain.  Brother is here who cannulates the fistula.    States he missed f/u w/ [facility] for results of the biopsy but has not had any further problems w/ bloody stools or needing blood transfusions.     Echart:   nov 2017 - MSSA bacteremia, on home HD w/ AVG, echo showed questionable vegetation, 6 wks IV abx , OP TEE  dec 2017 - resp failure/ vol overload, treated w/ HD, lean body wt loss, echo showed severe AI , no bacteremia or vegetation on echo, MRI L-S spine was negative for osteo.   dec 2018 - L knee pain w/ septic arthritis, didn't respond to po abx , underwent L knee I&D per ortho team, dc'd home  jan 2019 - acute on chronic anemia, Hb 4.9 got 4units prbcs, no obvious bleeding, on dapto for recent septic L knee. esrd on home HD.   jan 2019 - anemia, got 2 u prbcs, hematology consult. F/u office hematology.   jun 2019 - ams and seizures due to uremia from noncompliance w/ home HD. Treated w/ dialysis and seizures medications. Prior sz in 2012.  Improved sig w/ HD in hospital. Started on Keppra then this was stopped due to dropping plts. HIT was negative. Megace started due to gen weakness. 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   July 2019 - UGIB, symptomatic anemia, got total 7 u prbcs during  admission.  Had recent procedure at [facility] underwent biopsy of duodenal mass, partial mass removal and biliary stent placement on 7/1.  Path pending. Home HD 4x per week.  HIV.   July 2019 - SOB due to vol overload, rec'd HD x 2 w/ resolution of symptoms. Had seizure after HD on 7/22.  Started on Vimpat w/ plan for OP neuro f/u to titrate as needed.  Difficult to manage HTN.       ROS  denies CP  no joint pain   no HA  no blurry vision  no rash  no diarrhea    Past Medical History  Past Medical History:  Diagnosis Date  . Anemia   . Anxiety   . Arthritis   . Asthma    per pt hx  . End stage renal disease on home HD 07/10/2011   Started HD in September 2012 at [facility] with a tunneled HD catheter, now on home HD with NxtStage. Dialyzing through AVF L lower arm with buttonhole technique as of mid 2014. His brother does the HD treatments at home.  They are roommates for 23 years.  The brother works 3rd shift and gets off about 8am and then puts Jeffrey [name] on HD in the morning after getting  home. Most of the time he does HD about 4 times a week, for about 4 hours per treatment. Cause of ESRD was HTN according to patient. He says he let his health go and ending up with complications, and that he didn't like seeing doctors in those days.  He says he was diagnosed with severe HTN when he lived in [place] in his 59's.   . Hepatitis B carrier ([place])   . HIV infection ([place])   . Hypertension   . Hyperthyroidism    normal now  . Pneumonia several yrs ago  . Seizure ([place])   . Seizures ([place]) 06/02/2011   x 1 none since  . Thrombocytopenia [facility])    Past Surgical History  Past Surgical History:  Procedure Laterality Date  . AV FISTULA PLACEMENT  06/02/11   Left radiocephalic AVF  . BIOPSY  02/17/2018   Procedure: BIOPSY;  Surgeon: Milus Banister, MD;  Location: [facility] ENDOSCOPY;  Service: Endoscopy;;  . COLONOSCOPY    . COLONOSCOPY WITH PROPOFOL N/A 01/27/2018   Procedure: COLONOSCOPY WITH PROPOFOL;   Surgeon: [name], MD;  Location: [facility] ENDOSCOPY;  Service: Gastroenterology;  Laterality: N/A;  . CORONARY ANGIOPLASTY    . ERCP  03/21/2018   AT [place]  . ESOPHAGOGASTRODUODENOSCOPY N/A 02/17/2018   Procedure: ESOPHAGOGASTRODUODENOSCOPY (EGD);  Surgeon: Milus Banister, MD;  Location: [facility] END[facility];  Service: Endoscopy;  Laterality: N/A;  . ESOPHAGOGASTRODUODENOSCOPY (EGD) WITH PROPOFOL N/A 01/27/2018   Procedure: ESOPHAGOGASTRODUODENOSCOPY (EGD) WITH PROPOFOL;  Surgeon: [name], MD;  Location: [facility] ENDOSCOPY;  Service: Gastroenterology;  Laterality: N/A;  . ESOPHAGOGASTRODUODENOSCOPY (EGD) WITH PROPOFOL N/A 04/03/2018   Procedure: ESOPHAGOGASTRODUODENOSCOPY (EGD) WITH PROPOFOL;  Surgeon: Clarene Essex, MD;  Location: [place];  Service: Endoscopy;  Laterality: N/A;  . EUS N/A 02/17/2018   Procedure: UPPER ENDOSCOPIC ULTRASOUND (EUS) RADIAL;  Surgeon: Milus Banister, MD;  Location: [facility] ENDOSCOPY;  Service: Endoscopy;  Laterality: N/A;  . fistulaogram     x 2 last 2 years  . IR DIALY SHUNT INTRO NEEDLE/INTRACATH INITIAL W/IMG LEFT Left 04/20/2018  . IR FLUORO GUIDE CV LINE RIGHT  09/16/2017  . IR US GUIDE VASC ACCESS RIGHT  09/16/2017  . IRRIGATION AND DEBRIDEMENT KNEE Left 09/15/2017   Procedure: IRRIGATION AND DEBRIDEMENT KNEE; arthroscopic clean out;  Surgeon: [name], MD;  Location: [facility] ORS;  Service: Orthopedics;  Laterality: Left;  . OTHER SURGICAL HISTORY     removal temporary HD catheter   . REVISON OF ARTERIOVENOUS FISTULA Left 10/10/2015   Procedure: REVISON OF LEFT RADIOCEPHALIC ARTERIOVENOUS FISTULA;  Surgeon: Angelia Mould, MD;  Location: [place];  Service: Vascular;  Laterality: Left;  . REVISON OF ARTERIOVENOUS FISTULA Left 02/07/2016   Procedure: REPAIR OF PSEUDO-ANEUREYSM OF LEFT ARM  ARTERIOVENOUS FISTULA;  Surgeon: Angelia Mould, MD;  Location: [place];  Service: Vascular;  Laterality: Left;  . REVISON OF ARTERIOVENOUS FISTULA Left 0/27/7412    Procedure: PLICATION OF LEFT ARM RADIOCEPHALIC ARTERIOVENOUS FISTULA PSEUDOANEURYSM;  Surgeon: Angelia Mould, MD;  Location: [place];  Service: Vascular;  Laterality: Left;  . RIGHT/LEFT HEART CATH AND CORONARY ANGIOGRAPHY N/A 02/17/2017   Procedure: Right/Left Heart Cath and Coronary Angiography;  Surgeon: Sherren Mocha, MD;  Location: [place] CV LAB;  Service: Cardiovascular;  Laterality: N/A;  . TEE WITHOUT CARDIOVERSION N/A 09/11/2016   Procedure: TRANSESOPHAGEAL ECHOCARDIOGRAM (TEE);  Surgeon: Dorothy Spark, MD;  Location: [facility] ENDOSCOPY;  Service: Cardiovascular;  Laterality: N/A;   Family History  Family History  Problem Relation Age of Onset  . Hypertension Mother   . Diabetes Mother   . Cancer Father    Social History  reports that he quit smoking about 2 years ago. His smoking use included cigarettes. He quit after 10.00 years of use. He has never used smokeless tobacco. He reports that he does not drink alcohol or use drugs. Allergies  Allergies  Allergen Reactions  . Lisinopril Anaphylaxis and Shortness Of Breath    Throat swelling  . Penicillins Anaphylaxis and Other (See Comments)    Childhood allergy Has patient had a PCN reaction causing immediate rash, facial/tongue/throat swelling, SOB or lightheadedness with hypotension: Yes Has patient had a PCN reaction causing severe rash involving mucus membranes or skin necrosis: Unk Has patient had a PCN reaction that required hospitalization: Unk Has patient had a PCN reaction occurring within the last 10 years: No If all of the above answers are "NO", then may proceed with Cephalosporin use.   Marland Kitchen Morphine And Related Other (See Comments)    "Not Himself"   Home medications Prior to Admission medications   Medication Sig Start Date End Date Taking? Authorizing Provider  albuterol (PROVENTIL) (2.5 MG/3ML) 0.083% nebulizer solution Take 2.5 mg by nebulization every 6 (six) hours as needed for wheezing or shortness  of breath.   Yes [provider]  bictegravir-emtricitabine-tenofovir AF (BIKTARVY) 50-200-25 MG TABS tablet Take 1 tablet by mouth daily. 12/27/17  Yes Campbell Riches, MD  calcium acetate (PHOSLO) 667 MG capsule Take 667-2,001 mg by mouth See admin instructions. Take 1,334 mg three times a day with each meal and 667 mg with each snack 06/06/15  Yes [provider]  cloNIDine (CATAPRES - DOSED IN MG/24 HR) 0.2 mg/24hr patch Place 1 patch (0.2 mg total) onto the skin once a week. 04/19/18  Yes Weaver, Scott T, PA-C  clotrimazole-betamethasone (LOTRISONE) cream Apply 1 application topically 2 (two) times daily. 09/08/17  Yes Campbell Riches, MD  epoetin alfa (EPOGEN,PROCRIT) 2000 UNIT/ML injection Inject 2,000 Units into the vein 3 (three) times a week.    Yes [provider]  ethyl chloride spray Apply 1 application topically daily as needed (HD- to numb).  05/20/15  Yes [provider]  heparin 1000 UNIT/ML injection Inject 3,000 Units into the vein 4 (four) times a week.    Yes [provider]  hydrALAZINE (APRESOLINE) 100 MG tablet Take 1 tablet (100 mg total) by mouth 3 (three) times daily. 04/14/18  Yes Velna Ochs, MD  HYDROcodone-acetaminophen (NORCO/VICODIN) 5-325 MG tablet Take 1 tablet by mouth every 6 (six) hours as needed. Patient taking differently: Take 1 tablet by mouth every 6 (six) hours as needed for moderate pain.  04/18/18  Yes Glyn Ade, PA-C  Ipratropium-Albuterol (COMBIVENT IN) Inhale 2 puffs into the lungs every 6 (six) hours as needed (as needed for shortness of breath).    Yes [provider]  iron sucrose (VENOFER) 20 MG/ML injection Inject 100 mg into the vein every Monday.    Yes [provider]  lacosamide (VIMPAT) 50 MG TABS tablet Take 1.5 tablets (75 mg total) by mouth 2 (two) times daily. 04/14/18  Yes Velna Ochs, MD  megestrol (MEGACE) 40 MG tablet Take 2 tablets (80 mg total) by mouth  2 (two) times daily. Patient taking differently: Take 40 mg by mouth daily.  02/28/18  Yes Kathi Ludwig, MD  multivitamin (RENA-VIT) TABS tablet Take 1 tablet by mouth daily.     Yes [provider]  omeprazole (PRILOSEC) 20 MG capsule Take 1 capsule (20 mg total) by mouth daily. 01/31/18  Yes [name], MD  traZODone (DESYREL) 50 MG tablet TAKE 1 TABLET(50 MG) BY MOUTH AT BEDTIME AS NEEDED FOR SLEEP Patient taking differently: Take 50 mg by mouth at bedtime as needed for sleep.  01/31/18  Yes Campbell Riches, MD  VOLTAREN 1 % GEL APPLY 2 GRAMS EXTERNALLY TO THE AFFECTED AREA FOUR TIMES DAILY Patient taking differently: Apply 2 g topically 3 (three) times daily as needed (pain).  09/08/17  Yes Campbell Riches, MD  carvedilol (COREG) 25 MG tablet Take 1 tablet (25 mg total) by mouth 2 (two) times daily with a meal. 04/04/18   Alphonzo Grieve, MD   Liver Function Tests No results for input(s): AST, ALT, ALKPHOS, BILITOT, PROT, ALBUMIN in the last 168 hours. No results for input(s): LIPASE, AMYLASE in the last 168 hours. CBC Recent Labs  Lab 05/01/18 2117  WBC 11.3*  HGB 8.1*  HCT 26.6*  MCV 97.8  PLT 852   Basic Metabolic Panel Recent Labs  Lab 05/01/18 2117  NA 134*  K 3.7  CL 95*  CO2 25  GLUCOSE 104*  BUN 55*  CREATININE 9.27*  CALCIUM 8.6*   Iron/TIBC/Ferritin/ %Sat    Component Value Date/Time   IRON 28 (L) 09/23/2017 1018   TIBC 136 (L) 09/23/2017 1018   FERRITIN 851 (H) 09/23/2017 1018   IRONPCTSAT 21 09/23/2017 1018    Vitals:   05/01/18 2254 05/01/18 2324 05/01/18 2330 05/02/18 0000  BP:  (!) 117/96 103/83 110/85  Pulse: 82 81 77 81  Resp: (!) 30 (!) 24 (!) 30 19  Temp:      TempSrc:      SpO2:  100% 100%   Height:       Exam Gen awake, tired appearing, slight ^wob, not in distress No rash, cyanosis or gangrene Sclera anicteric, throat clear  No jvd or bruits Chest rales L base, R side clear, no wheezing RRR no MRG Abd soft ntnd  no mass or ascites +bs GU normal male MS no joint effusions or deformity Ext 1+ bilat pedal edema, no wounds or ulcers Neuro is alert, Ox 3 , nf  L forearm AVF loud bruit    Home meds:  - clonidine patch 0.2 q wk/ hydralazine 100 tid/ coreg 25 bid  - trazodone 50 hs prn/ norco prn  - albuterol nebs prn/ duoneb prn  - bictegravir-emtricitabine-tenofovir AF 5-200-25 qd  - phoslo ac tid/ PPI/ MVI/ megace  - lacosamide 75mg  bid  Dialysis: home HD 4h 4x per week   Hep 4000   Dry wt 76kg but may need lowering  CXR - no obvious edema, L effusion/ infiltrate   Impression: 1  Dyspnea - may be vol overload, also L effusion on CXR.  Plan HD in am Monday, max UF 3 L (per pt).  May need serial HD here,  or at home once stable.  2  +troponin - per primary team 3  ESRD - on  Home HD as above, L arm AVF.  4  HIV 5  Hx GI bleed / duodenal mass biopsy - this summer 6  HTN - bp's on the lower side, will possibly lower BP meds 7  HIV - ART 8  Seizure d/o - on lacosamide   Plan - as above  Kelly Splinter MD [facility] pager [phone]   05/02/2018, 12:46 AM

## 2018-05-02 NOTE — Progress Notes (Signed)
Subjective: Jeffrey [name] was seen during HD today. He reported feeling okay, mostly complaining of left shoulder pain, left hand swelling, and bilateral ankle pain and foot swelling. He said he had a fall two weeks ago where he fell backwards while climbing stairs in front of his home. His pain has not improved since the fall. He has been able to ambulate fully even with the ankle swelling and foot pain. He said he felt short of breath for the past few days and that is what prompted him to come to the hospital.   He was seen after HD and seen on 2L [place]. He said he was still having trouble breathing without [place]. He was still complaining of left shoulder pain and left hand swelling.   Objective:  Vital signs in last 24 hours: Vitals:   05/02/18 1200 05/02/18 1215 05/02/18 1249 05/02/18 1710  BP: (!) 158/80 (!) 150/70 (!) 169/81 (!) 179/89  Pulse: 74 70 69 78  Resp: 18 18 18 18   Temp:  98 F (36.7 C) 98.3 F (36.8 C) 99.4 F (37.4 C)  TempSrc:  Oral Oral Oral  SpO2: 100% 100% 100% 100%  Weight:  73 kg    Height:       Physical Exam  Constitutional: No distress.  Cardiovascular: Normal rate, regular rhythm and normal heart sounds.  No murmur heard. Pulmonary/Chest: Effort normal. No respiratory distress. He has rales (bilateral lower lobe rales ).  Musculoskeletal: left shoulder tenderness at the [facility] joint, left hand was slightly swollen and warm to touch, his shoulder pain was exacerbated with supination of his forearm and flexion, mild bilateral foot swelling, no pitting edema   Assessment/Plan:  Active Problems:   ESRD needing dialysis ([place])   Hypervolemia   ESRD on hemodialysis ([place])   Asymptomatic HIV infection [facility])  Jeffrey [name] is a 62 year-old male with a history of HIV, Hepatitis B, HTN, duodenal mass with a hx of GI bleeds, CAD and ESRD on home HD 4x a week presenting with dyspnea and orthopnea for the past week.   Volume overload in the setting of ESRD on home HD: Patient's  symptoms, workup and exam findings are consistent with volume overload. He informed us he needed adjustment of his dry weight and was most likely having inadequate volume removal during home HD. He was evaluated after HD and felt some improvement of his symptoms. He was seen again after HD and still had some lower lobe rales and some difficulty breathing without [place]. Per nephro note, appropriate changes were made during HD.  -reassess for volume overload in the am -? Serial HD   Shortness of breath: Patient mostly stable on 2L [place]. He is still experiencing some SOB, will attempt to wean him off [place] and see how he responds. Euvolemic. Last spirometry from 2014 shows very severe airflow limitation, improvement seen on bronchodilators.   -continue albuterol Q6H prn -start anoro ellipta tomorrow, 1 puff daily   I-stat troponin: Elevated I stat troponin 0.16 on admission. Trending troponins- 0.22 to 0.17, trending downwards. Most likely due to demand ischemia rather than fluid overload. He denied any chest pain and EKG was unchanged from prior.  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 Next step is the EUS. Colonoscopy of colon showed pre-cancerous polyps as well and recommended repeat colonoscopy in 6 months. -f/u outpatient GI   Left hand edema and shoulder pain: Patient reported left shoulder pain and hand swelling since a fall two weeks ago. He is not able to flex his shoulder or supinate his forearm without pain. His left hand swelling is very mild compared to his right hand, warm to touch, non tender. Recommend following up with PCP for imaging of shoulder and pain management. Left hand swelling could be related to fistula  which is in his left arm.  -f/u with PCP   HIV: CD4 months ago 390. -continue biktarvy 50-200-25 1 tablet QD -continue megestrol 40 mg QD   Seizure: Last seizure was after HD on 7/22. Renally adjusted his vimpat. -continue vimpat 75 mg BID -start taking an additional vimpat 25 mg only after hemodialysis    Dispo: Anticipated discharge in approximately 1 day(s). Weaning patient off of [place] and will monitor to see how he does on room air.   Rehman, Areeg N, DO 05/02/2018, 8:12 PM Pager: [phone]

## 2018-05-02 NOTE — Progress Notes (Signed)
Internal Medicine Attending  Date: 05/02/2018  Patient name: Jeffrey [name] Medical record number: [id number] Date of birth: 1955-10-04 Age: 62 y.o. Gender: male  I saw and evaluated the patient. I reviewed the resident's note by Dr. Laural Golden and I agree with the resident's findings and plans as documented in her progress note.  Please see my H&P dated 05/02/2018 and attached to Dr. Darcey Nora H&P dated 05/02/2018 for the specifics of my evaluation, assessment, and plan from earlier in the day.  Since then a positive blood culture was found in the anaerobic bottle of the right antecubital site. Biofire identified it as a non-aureus staphylococcal methicillin-resistant cocci. Although this generally would be considered a contaminant, the right forearm blood culture in both the aerobic and anaerobic bottles had a similar resolved. As he has a graft this cannot be ignored and we will give him a dose of vancomycin tonight. We will also initiate further workup in the morning to include an echocardiogram and repeat blood cultures after receiving the vancomycin. We will also search for potential other sources.

## 2018-05-02 NOTE — Progress Notes (Addendum)
PHARMACY - PHYSICIAN COMMUNICATION CRITICAL VALUE ALERT - BLOOD CULTURE IDENTIFICATION (BCID)  Jeffrey [name] is an 62 y.o. male who presented to [facility] on 05/01/2018 with a chief complaint of extremity swelling and shortness of breath.   Assessment:  Pt has a h/o of CKD on HD diagnosed as volume overload. Patient is stable on room air per RN. 2/4 (both anaerobic bottles) grew out coag neg staph. This is likely contaminant. Will monitor clinical status.   Name of physician (or Provider) Contacted: Tylene Fantasia  Current antibiotics: None  Changes to prescribed antibiotics recommended:  No antibiotics recommended. MD prefers to start IV vancomycin due to concern for true infection.   Results for orders placed or performed during the hospital encounter of 05/01/18  Blood Culture ID Panel (Reflexed) (Collected: 05/01/2018 11:24 PM)  Result Value Ref Range   Enterococcus species NOT DETECTED NOT DETECTED   Listeria monocytogenes NOT DETECTED NOT DETECTED   Staphylococcus species DETECTED (A) NOT DETECTED   Staphylococcus aureus NOT DETECTED NOT DETECTED   Methicillin resistance DETECTED (A) NOT DETECTED   Streptococcus species NOT DETECTED NOT DETECTED   Streptococcus agalactiae NOT DETECTED NOT DETECTED   Streptococcus pneumoniae NOT DETECTED NOT DETECTED   Streptococcus pyogenes NOT DETECTED NOT DETECTED   Acinetobacter baumannii NOT DETECTED NOT DETECTED   Enterobacteriaceae species NOT DETECTED NOT DETECTED   Enterobacter cloacae complex NOT DETECTED NOT DETECTED   Escherichia coli NOT DETECTED NOT DETECTED   Klebsiella oxytoca NOT DETECTED NOT DETECTED   Klebsiella pneumoniae NOT DETECTED NOT DETECTED   Proteus species NOT DETECTED NOT DETECTED   Serratia marcescens NOT DETECTED NOT DETECTED   Haemophilus influenzae NOT DETECTED NOT DETECTED   Neisseria meningitidis NOT DETECTED NOT DETECTED   Pseudomonas aeruginosa NOT DETECTED NOT DETECTED   Candida albicans NOT DETECTED NOT  DETECTED   Candida glabrata NOT DETECTED NOT DETECTED   Candida krusei NOT DETECTED NOT DETECTED   Candida parapsilosis NOT DETECTED NOT DETECTED   Candida tropicalis NOT DETECTED NOT DETECTED    Albertina Parr, PharmD., BCPS Clinical Pharmacist Clinical phone for 05/02/18 until 3:30pm: [phone] If after 3:30pm, please refer to [facility] for unit-specific pharmacist

## 2018-05-02 NOTE — Procedures (Signed)
I was present at this session.  I have reviewed the session itself and made appropriate changes.   HD via LLA avf. resp status better.  Not sure why only 3 L.  Access press ok.   [name] Alexcia Schools 8/12/201910:58 AM

## 2018-05-02 NOTE — Care Management Note (Signed)
Case Management Note  Patient Details  Name: Jeffrey [name] MRN: [id number] Date of Birth: [birth year]  Subjective/Objective:      ESRD, volume overload              Action/Plan: Spoke to pt and active with [facility]. Pt is active with HHPT. Need resumption of care order for [facility]. Pt has RW at home. Lives with his brother who assist with his HD at home.   Expected Discharge Date:                  Expected Discharge Plan:  [place]  In-House Referral:  NA  Discharge planning Services  CM Consult  Post Acute Care Choice:  Home Health, Resumption of Svcs/PTA Provider Choice offered to:  Patient  DME Arranged:  N/A DME Agency:  NA  HH Arranged:  PT [place] Agency:  [place]  Status of Service:  In process, will continue to follow  If discussed at Long Length of Stay Meetings, dates discussed:    Additional Comments:  Erenest Rasher, RN 05/02/2018, 2:54 PM

## 2018-05-03 ENCOUNTER — Inpatient Hospital Stay (HOSPITAL_COMMUNITY): Payer: Medicare Other

## 2018-05-03 ENCOUNTER — Encounter: Payer: Medicare Other | Admitting: Infectious Diseases

## 2018-05-03 DIAGNOSIS — Z8619 Personal history of other infectious and parasitic diseases: Secondary | ICD-10-CM

## 2018-05-03 DIAGNOSIS — B957 Other staphylococcus as the cause of diseases classified elsewhere: Secondary | ICD-10-CM

## 2018-05-03 DIAGNOSIS — I428 Other cardiomyopathies: Secondary | ICD-10-CM

## 2018-05-03 DIAGNOSIS — I340 Nonrheumatic mitral (valve) insufficiency: Secondary | ICD-10-CM

## 2018-05-03 DIAGNOSIS — R7881 Bacteremia: Secondary | ICD-10-CM

## 2018-05-03 LAB — ECHOCARDIOGRAM COMPLETE
Height: 72 in
Weight: 2574.97 oz

## 2018-05-03 MED ORDER — HYDRALAZINE HCL 50 MG PO TABS
100.0000 mg | ORAL_TABLET | Freq: Three times a day (TID) | ORAL | Status: DC
  Administered 2018-05-03 – 2018-05-06 (×7): 100 mg via ORAL
  Filled 2018-05-03 (×8): qty 2

## 2018-05-03 MED ORDER — CHLORHEXIDINE GLUCONATE CLOTH 2 % EX PADS
6.0000 | MEDICATED_PAD | Freq: Every day | CUTANEOUS | Status: DC
Start: 2018-05-03 — End: 2018-05-06
  Administered 2018-05-03 – 2018-05-04 (×2): 6 via TOPICAL

## 2018-05-03 MED ORDER — DARBEPOETIN ALFA 150 MCG/0.3ML IJ SOSY
150.0000 ug | PREFILLED_SYRINGE | INTRAMUSCULAR | Status: DC
Start: 2018-05-04 — End: 2018-05-06
  Administered 2018-05-04: 150 ug via INTRAVENOUS
  Filled 2018-05-03: qty 0.3

## 2018-05-03 NOTE — Progress Notes (Signed)
Internal Medicine Attending  Date: 05/03/2018  Patient name: Jeffrey [name] Medical record number: [id number] Date of birth: [birth year] Age: 62 y.o. Gender: male  I saw and evaluated the patient. I reviewed the resident's note by Dr. Laural Golden and I agree with the resident's findings and plans as documented in her progress note.  When seen on rounds this morning Jeffrey [name] was without acute complaints. He denied any fevers, shakes, or chills. His left upper extremity AV fistula was without warmth, fluctuance, or tenderness. 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 His repeat blood cultures drawn today are pending. We are continuing the vancomycin after hemodialysis and will obtain a TEE consult in the morning to assess the severe aortic insufficiency in the setting of possible coag-negative staph bacteremia and further assess the left ventricular non-compaction.

## 2018-05-03 NOTE — Progress Notes (Signed)
Subjective: Mr. Dubie reported feeling some improvement in his breathing today. He has been taking off the [place] for two hours at a time with SOB returning after a few hours. He has been able to ambulate some with his [place].   He has two positive blood cultures for non-aureus staphylococcal methicillin resistant cocci. He was recently evaluated for bleeding from his graft in July. He said he was told he did not have any infection at that time and was told he did not need any further management of his graft. He denied any abrasions, bleeding or cuts from his recent fall. Denied any recent sick contacts or surgeries besides the left forearm AV graft and ERCP with stent placement.   Objective:  Vital signs in last 24 hours: Vitals:   05/03/18 0442 05/03/18 0923 05/03/18 0931 05/03/18 1111  BP: (!) 152/73 (!) 208/180 (!) 196/90   Pulse: 80 85    Resp: 18 18    Temp: 99 F (37.2 C) 98.7 F (37.1 C)    TempSrc: Oral Oral    SpO2: 100% 98%  100%  Weight:      Height:       Physical Exam  Constitutional: He is oriented to person, place, and time and well-developed, well-nourished, and in no distress.  Cardiovascular: Normal rate, regular rhythm and normal heart sounds.  No murmur heard. Pulmonary/Chest: Effort normal and breath sounds normal. No respiratory distress. He has no wheezes.  Neurological: He is alert and oriented to person, place, and time.  Skin: left arm graft with no signs of infection or bleeding Musculoskeletal: left shoulder pain at the [facility] joint, decreased flexion, left arm supination improved today  Assessment/Plan:  Active Problems:   ESRD needing dialysis ([place])   Hypervolemia   ESRD on hemodialysis ([place])   Asymptomatic HIV infection [facility])  Mr. 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 2/2 Blood cultures are positive  for non-aureus staphylococcal methicillin-resistant cocci.   Volume overload in the setting of ESRD on home HD: Euvolemic on exam today. Continue HD tomorrow. Unclear if dry weight adjustments made during HD. -follow with nephro -? Serial HD   Positive blood cultures: 2/2 blood cultures positive for non-aureus methicillin resistant staphylococci. Vancomycin 1500 mg IV was given yesterday and he will continue to receive 750 mg IV with HD. Ordered repeat blood cultures and echo (results below). Unclear source of infection. Left forearm AV fistula does not appear infected, no pain, bleeding or warmth on exam. Recently evaluated in 07/19 and no further management was recommended. Afebrile, no leukocytosis. Left shoulder pain unlikely septic joint; patient denied any abrasions, cuts or bleeding after his recent fall.  -f/u repeat blood cultures -continue vancomycin 750 mg IV with HD   Echo Impressions:  - Normal LVF with severe LVH and mild LVE. There is a left pleural   effusion and trivial pericardial effusion posteriorly. There is   mild MR and trivial PR. The AV is not well visualized but there   appears to be moderate to severe AI of unclear etiology. Suggest   TEE to further evaluated AV. Consider Cardiac MRI to evaluate for   non compaction LV.  Shortness of breath: Patient mostly stable on 2L [place]. He is still experiencing some SOB, will attempt to wean him off [place]  and see how he responds. Euvolemic. CXR showed new left basilar infiltrate with associated effusion. Cannot rule out pneumonia; however, patient is afebrile, no cough, no leukocytosis or recent sick contacts. Unlikely related to positive blood cultures given the species. -continue albuterol Q6H prn -continue anoro ellipta tomorrow, 1 puff daily   HTN: 179/89 -changed hydralazine to 50 mg Q8H  -continue carvedilol 25 mg BID  -continue clonidine 0.2 mg transdermal   Left hand edema and shoulder pain: Patient is still having  left shoulder pain, hand swelling has resolved. He was able to move his shoulder more today, with no pain on supination of his forearm.  -pain control: hydrocodone-acetaminophen 1-2 tablets Q6H -f/u with PCP   Seizure: Last seizure was after HD on 7/22. Renally adjusted his vimpat. -continue vimpat 75 mg BID -start taking an additional vimpat 25 mg only after hemodialysis   Dispo: Anticipated discharge in approximately 1-2 day(s).   Rehman, Areeg N, DO 05/03/2018, 2:57 PM Pager: [phone]

## 2018-05-03 NOTE — Progress Notes (Signed)
Subjective: Interval History: has no complaint , feeling better.  Objective: Vital signs in last 24 hours: Temp:  [98 F (36.7 C)-99.4 F (37.4 C)] 98.7 F (37.1 C) (08/13 0923) Pulse Rate:  [69-85] 85 (08/13 0923) Resp:  [18-19] 18 (08/13 0923) BP: (143-208)/(69-180) 196/90 (08/13 0931) SpO2:  [98 %-100 %] 98 % (08/13 0923) Weight:  [73 kg] 73 kg (08/12 2124) Weight change:   Intake/Output from previous day: 08/12 0701 - 08/13 0700 In: 633.4 [P.O.:480; IV Piggyback:153.4] Out: 3000  Intake/Output this shift: Total I/O In: 120 [P.O.:120] Out: -   General appearance: alert, cooperative and no distress Resp: clear to auscultation bilaterally Cardio: S1, S2 normal, S4 present and systolic murmur: systolic ejection 2/6, decrescendo at 2nd left intercostal space GI: soft, pos bs, liver down 5 cm Extremities: AVF RUA  no inflam  Lab Results: Recent Labs    05/01/18 2117  WBC 11.3*  HGB 8.1*  HCT 26.6*  PLT 194   BMET:  Recent Labs    05/01/18 2117 05/02/18 0409  NA 134* 136  K 3.7 3.7  CL 95* 98  CO2 25 22  GLUCOSE 104* 120*  BUN 55* 58*  CREATININE 9.27* 9.62*  CALCIUM 8.6* 8.3*   No results for input(s): PTH in the last 72 hours. Iron Studies: No results for input(s): IRON, TIBC, TRANSFERRIN, FERRITIN in the last 72 hours.  Studies/Results: Dg Chest 2 View  Result Date: 05/02/2018 CLINICAL DATA:  Shortness of breath EXAM: CHEST - 2 VIEW COMPARISON:  05/01/2018 FINDINGS: Cardiac shadow is stable. Persistent infiltrate and effusion in the left base is seen stable from the recent exam. The right lung is clear. No bony abnormality is seen. IMPRESSION: Left basilar infiltrate with associated effusion. Electronically Signed   By: Jeffrey [name] M.D.   On: 05/02/2018 02:01   Dg Chest Port 1 View  Result Date: 05/01/2018 CLINICAL DATA:  Shortness of breath and left shoulder pain for 3 weeks. Symptoms worsening. EXAM: PORTABLE CHEST 1 VIEW COMPARISON:  04/17/2018  FINDINGS: Borderline heart size and pulmonary vascularity probably normal for technique and shallow inspiration. There is increased density consistent with consolidation in the left lung base with blunting of the left costophrenic angle suggesting a small effusion. These changes likely indicate left lower lobe pneumonia. Right lung is clear. Mediastinal contours appear intact. No pneumothorax. Degenerative changes in the shoulders. IMPRESSION: Infiltration and effusion in the left lung base likely representing pneumonia. Electronically Signed   By: Jeffrey [name] M.D.   On: 05/01/2018 21:46   Dg Shoulder Left  Result Date: 05/02/2018 CLINICAL DATA:  Recent fall with left shoulder pain, initial encounter EXAM: LEFT SHOULDER - 2+ VIEW COMPARISON:  None. FINDINGS: Degenerative changes of the glenohumeral joint are seen. No acute fracture or dislocation is noted. Underlying bony thorax is within normal limits. IMPRESSION: Degenerative changes without acute abnormality. Electronically Signed   By: Jeffrey [name] M.D.   On: 05/02/2018 02:01    I have reviewed the patient's current medications.  Assessment/Plan: 1 ESRD  For HD tomorrow.  Vol xs yet but tolerable. 2 htn lower vol 3 Anemia add esa 4 pos BC ?? Real but agree with tx 5 Hep B 6 HIV 7 HPTH P HD, esa, AB,     LOS: 2 days   Jeffrey [name] 05/03/2018,9:47 AM

## 2018-05-03 NOTE — Consult Note (Signed)
   [facility] CM Inpatient Consult   05/03/2018  GABRIAN HOQUE 11-16-1955 [id number]    Mr. Schrecengost screened for potential [facility] Care Management services due to unplanned readmission risk score of 57% (extreme) and multiple hospitalizations.  Went to bedside to speak with Mr. Italiano and brother about [place] Management. However, Mr. Schlender was resting and patient's brother was not present. Will follow up at later time.   [name], MSN-Ed, RN,BSN [facility] Liaison [phone]

## 2018-05-03 NOTE — Progress Notes (Signed)
  Echocardiogram 2D Echocardiogram has been performed.  Jeffrey [name] 05/03/2018, 12:14 PM

## 2018-05-04 ENCOUNTER — Other Ambulatory Visit: Payer: Self-pay | Admitting: Infectious Diseases

## 2018-05-04 DIAGNOSIS — M25462 Effusion, left knee: Secondary | ICD-10-CM

## 2018-05-04 LAB — PREPARE RBC (CROSSMATCH)

## 2018-05-04 LAB — CBC
HEMATOCRIT: 22.8 % — AB (ref 39.0–52.0)
Hemoglobin: 6.8 g/dL — CL (ref 13.0–17.0)
MCH: 28.8 pg (ref 26.0–34.0)
MCHC: 29.8 g/dL — ABNORMAL LOW (ref 30.0–36.0)
MCV: 96.6 fL (ref 78.0–100.0)
PLATELETS: 195 10*3/uL (ref 150–400)
RBC: 2.36 MIL/uL — ABNORMAL LOW (ref 4.22–5.81)
RDW: 17.4 % — ABNORMAL HIGH (ref 11.5–15.5)
WBC: 7 10*3/uL (ref 4.0–10.5)

## 2018-05-04 LAB — RENAL FUNCTION PANEL
Albumin: 2.3 g/dL — ABNORMAL LOW (ref 3.5–5.0)
Anion gap: 11 (ref 5–15)
BUN: 53 mg/dL — AB (ref 8–23)
CHLORIDE: 101 mmol/L (ref 98–111)
CO2: 25 mmol/L (ref 22–32)
Calcium: 8.4 mg/dL — ABNORMAL LOW (ref 8.9–10.3)
Creatinine, Ser: 8.16 mg/dL — ABNORMAL HIGH (ref 0.61–1.24)
GFR calc Af Amer: 7 mL/min — ABNORMAL LOW (ref >60)
GFR calc non Af Amer: 6 mL/min — ABNORMAL LOW (ref >60)
GLUCOSE: 99 mg/dL (ref 70–99)
POTASSIUM: 4.4 mmol/L (ref 3.5–5.1)
Phosphorus: 3.7 mg/dL (ref 2.5–4.6)
Sodium: 137 mmol/L (ref 135–145)

## 2018-05-04 MED ORDER — ALTEPLASE 2 MG IJ SOLR: 2.0000 mg | Freq: Once | INTRAMUSCULAR | Status: DC | PRN

## 2018-05-04 MED ORDER — SODIUM CHLORIDE 0.9 % IV SOLN: 100.0000 mL | INTRAVENOUS | Status: DC | PRN

## 2018-05-04 MED ORDER — DARBEPOETIN ALFA 150 MCG/0.3ML IJ SOSY
PREFILLED_SYRINGE | INTRAMUSCULAR | Status: AC
  Filled 2018-05-04: qty 0.3

## 2018-05-04 MED ORDER — LIDOCAINE HCL (PF) 1 % IJ SOLN: 5.0000 mL | INTRAMUSCULAR | Status: DC | PRN

## 2018-05-04 MED ORDER — HEPARIN SODIUM (PORCINE) 1000 UNIT/ML DIALYSIS: 1000.0000 [IU] | INTRAMUSCULAR | Status: DC | PRN

## 2018-05-04 MED ORDER — DIPHENHYDRAMINE HCL 25 MG PO CAPS: 25.0000 mg | ORAL_CAPSULE | Freq: Once | ORAL | Status: AC

## 2018-05-04 MED ORDER — SODIUM CHLORIDE 0.9% IV SOLUTION: Freq: Once | INTRAVENOUS | Status: DC

## 2018-05-04 MED ORDER — PENTAFLUOROPROP-TETRAFLUOROETH EX AERO: 1.0000 "application " | INHALATION_SPRAY | CUTANEOUS | Status: DC | PRN

## 2018-05-04 MED ORDER — HEPARIN SODIUM (PORCINE) 1000 UNIT/ML DIALYSIS: 100.0000 [IU]/kg | INTRAMUSCULAR | Status: DC | PRN

## 2018-05-04 MED ORDER — LIDOCAINE-PRILOCAINE 2.5-2.5 % EX CREA: 1.0000 "application " | TOPICAL_CREAM | CUTANEOUS | Status: DC | PRN

## 2018-05-04 MED ORDER — DIPHENHYDRAMINE HCL 25 MG PO CAPS
ORAL_CAPSULE | ORAL | Status: AC
  Administered 2018-05-04: 25 mg
  Filled 2018-05-04: qty 1

## 2018-05-04 NOTE — H&P (View-Only) (Signed)
Internal Medicine Attending  Date: 05/04/2018  Patient name: Jeffrey [name] Medical record number: [id number] Date of birth: 01-25-1956 Age: 62 y.o. Gender: male  I saw and evaluated the patient. I reviewed the resident's note by Dr. Laural Golden and I agree with the resident's findings and plans as documented in her progress note.  When seen on rounds this morning [name] was feeling well and was without any specific complaints other than wanting to go home. He is scheduled to undergo TEE to evaluate his severe aortic insufficiency and see if it may be a potential source for his persistent coag negative staph bacteremia. Along those lines, one of 2 of the blood cultures drawn after the first dose of vancomycin are positive for the same organism. In addition to the TEE, we will obtain blood cultures tomorrow, again in hopes of demonstrating sterility. If we are unable to find a source for the persistent bacteremia on TEE we will consult infectious disease to guide [name] on any further workup that may be needed and or how long we should treat a coag negative staph bacteremia without an identified source.

## 2018-05-04 NOTE — Care Management Important Message (Signed)
Important Message  Patient Details  Name: Jeffrey [name] MRN: [id number] Date of Birth: August 11, 1956   Medicare Important Message Given:  Yes    [name] 05/04/2018, 2:32 PM

## 2018-05-04 NOTE — Progress Notes (Signed)
Internal Medicine Attending  Date: 05/04/2018  Patient name: Jeffrey [name] Medical record number: [id number] Date of birth: 14-May-1956 Age: 62 y.o. Gender: male  I saw and evaluated the patient. I reviewed the resident's note by Dr. Laural Golden and I agree with the resident's findings and plans as documented in her progress note.  When seen on rounds this morning [name] was feeling well and was without any specific complaints other than wanting to go home. He is scheduled to undergo TEE to evaluate his severe aortic insufficiency and see if it may be a potential source for his persistent coag negative staph bacteremia. Along those lines, one of 2 of the blood cultures drawn after the first dose of vancomycin are positive for the same organism. In addition to the TEE, we will obtain blood cultures tomorrow, again in hopes of demonstrating sterility. If we are unable to find a source for the persistent bacteremia on TEE we will consult infectious disease to guide [name] on any further workup that may be needed and or how long we should treat a coag negative staph bacteremia without an identified source.

## 2018-05-04 NOTE — Consult Note (Addendum)
   [facility] CM Inpatient Consult   05/04/2018  LENARDO WESTWOOD 1955-10-02 [id number]    [facility] Care Management follow up due to unplanned readmission risk score of 58% (extreme) and multiple hospitalizations .  Went back to bedside to speak with Mr. Bruington. He asked that Probation officer speak with his brother [name]. Spoke with patient's brother [name], per patient request, about [place] Management program services. [name] is agreeable and states he should be contacted for post hospital discharge calls at [phone].  [facility] Care Management written consent obtained and [facility] folder provided. Patient also agreeable to his brother being contacted primarily post discharge. Also agreeable to [place] Management program services as well.  Explained that [place] Management will not interfere or replace services provided by home health. Appears Mr. Mode is active with [facility]  for PT.  Primary Care Provider is Dr. Philipp Ovens [facility] Internal Medicine is listed for doing transition of care calls).   Mr. Bracewell lives with his brother, [name] [phone].   Denies any current medication or transportation needs as this time.  Will make referral to [facility]. Mr. Arata has a medical history of ESRD, on home dialysis, HIV, HTN, , anemia. He has had 4 hospitalizations in the past 6 months.  Will make inpatient RNCM aware [facility] will follow post discharge.   [name], MSN-Ed, RN,BSN [facility] Liaison [phone]

## 2018-05-04 NOTE — Progress Notes (Signed)
Subjective: Interval History: has no complaint .  Objective: Vital signs in last 24 hours: Temp:  [98.4 F (36.9 C)-99.4 F (37.4 C)] 98.9 F (37.2 C) (08/14 0747) Pulse Rate:  [67-98] 85 (08/14 0747) Resp:  [16-18] 18 (08/14 0747) BP: (99-183)/(69-82) 160/69 (08/14 0747) SpO2:  [64 %-100 %] 97 % (08/14 0747) Weight change:   Intake/Output from previous day: 08/13 0701 - 08/14 0700 In: 120 [P.O.:120] Out: 0  Intake/Output this shift: No intake/output data recorded.  General appearance: alert, cooperative and no distress Resp: diminished breath sounds bilaterally Cardio: S1, S2 normal and systolic murmur: systolic ejection 2/6, decrescendo at 2nd left intercostal space GI: obese, pos bs, liver down 4 cm Extremities: AVF LLA  Lab Results: Recent Labs    05/01/18 2117  WBC 11.3*  HGB 8.1*  HCT 26.6*  PLT 194   BMET:  Recent Labs    05/01/18 2117 05/02/18 0409  NA 134* 136  K 3.7 3.7  CL 95* 98  CO2 25 22  GLUCOSE 104* 120*  BUN 55* 58*  CREATININE 9.27* 9.62*  CALCIUM 8.6* 8.3*   No results for input(s): PTH in the last 72 hours. Iron Studies: No results for input(s): IRON, TIBC, TRANSFERRIN, FERRITIN in the last 72 hours.  Studies/Results: No results found.  I have reviewed the patient's current medications.  Assessment/Plan: 1 ESRD for HD, lower vol 2 HTN lower vol, lower meds 3 HIV 4 Hep B 5 Pos bs, w/u underway, on AB, ? contam 6 Anemia esa.  7 HPTH meds P HD, esa, check TEE,     LOS: 3 days   [name] Trina Asch 05/04/2018,9:44 AM

## 2018-05-04 NOTE — Progress Notes (Signed)
   Subjective: Jeffrey [name] reported feeling better today. He was not seen on [place] and reported he was only using oxygen sometimes. He said he was able to ambulate without feeling SOB or requiring oxygen. He reported his shoulder pain is still present.   Objective:  Vital signs in last 24 hours: Vitals:   05/03/18 2138 05/04/18 0636 05/04/18 0738 05/04/18 0747  BP: (!) 181/77 99/69  (!) 160/69  Pulse: 97 89  85  Resp:  18  18  Temp:  98.4 F (36.9 C)  98.9 F (37.2 C)  TempSrc:  Oral  Oral  SpO2:  98% (!) 64% 97%  Weight:      Height:       Physical Exam  Constitutional: He is oriented to person, place, and time and well-developed, well-nourished, and in no distress.  Cardiovascular: Normal rate, regular rhythm and normal heart sounds.  No murmur heard. Pulmonary/Chest: Effort normal and breath sounds normal. No respiratory distress.  Musculoskeletal: He exhibits no edema.  Neurological: He is alert and oriented to person, place, and time.    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 2/2 Blood cultures are positive for non-aureus staphylococcal methicillin-resistant cocci. TEE tomorrow.   Volume overload in the setting of ESRD on home HD: Euvolemic on exam today. HD today. Unclear if dry weight adjustments made during HD. -follow with nephro -serial HD   Positive blood cultures, non-aureus methicillin resistant staphlycocci:  Unclear source of infection. AV not well visualized on echo. Ordered TEE. Continuing Vancomycin 750 mg IV with HD. Repeat BC pending. Afebrile, no signs  of infection.  -f/u repeat blood cultures -f/u TEE -consult ID if no clear source of + BC -continue vancomycin 750 mg IV with HD   Shortness of breath:  Patient doing well on room air, SpO2 97. Ambulating with little SOB and not requiring [place]. -continue albuterol Q6H prn -continue anoro ellipta tomorrow, 1 puff daily   HTN: 160/69 -changed hydralazine to 50 mg Q8H  -continue carvedilol 25 mg BID  -continue clonidine 0.2 mg transdermal   Left hand edema and shoulder pain: -pain control: hydrocodone-acetaminophen 1-2 tablets Q6H -f/u with PCP   Seizure:  -continue vimpat 75 mg BID -start taking an additional vimpat 25 mg only after hemodialysis  Dispo: Anticipated discharge in approximately 1-2 day(s).   Jeffrey [name], Jeffrey N, DO 05/04/2018, 3:00 PM Pager: [phone]

## 2018-05-04 NOTE — Procedures (Signed)
I was present at this session.  I have reviewed the session itself and made appropriate changes.  HD via LLA AVF.  bp ^ to get 4 L off.  Will transfuse.   [name] Bard Haupert 8/14/20193:23 PM

## 2018-05-05 ENCOUNTER — Other Ambulatory Visit: Payer: Self-pay | Admitting: Infectious Diseases

## 2018-05-05 ENCOUNTER — Inpatient Hospital Stay (HOSPITAL_COMMUNITY): Payer: Medicare Other

## 2018-05-05 ENCOUNTER — Encounter (HOSPITAL_COMMUNITY)
Admission: EM | Disposition: A | Discharge status: Home / Self Care | Payer: Self-pay | Source: Home / Self Care | Attending: Internal Medicine

## 2018-05-05 DIAGNOSIS — N186 End stage renal disease: Secondary | ICD-10-CM

## 2018-05-05 DIAGNOSIS — M25512 Pain in left shoulder: Secondary | ICD-10-CM

## 2018-05-05 DIAGNOSIS — I351 Nonrheumatic aortic (valve) insufficiency: Secondary | ICD-10-CM

## 2018-05-05 DIAGNOSIS — M25462 Effusion, left knee: Secondary | ICD-10-CM

## 2018-05-05 DIAGNOSIS — R0602 Shortness of breath: Secondary | ICD-10-CM

## 2018-05-05 DIAGNOSIS — B9689 Other specified bacterial agents as the cause of diseases classified elsewhere: Secondary | ICD-10-CM

## 2018-05-05 DIAGNOSIS — B957 Other staphylococcus as the cause of diseases classified elsewhere: Secondary | ICD-10-CM

## 2018-05-05 DIAGNOSIS — R7881 Bacteremia: Secondary | ICD-10-CM

## 2018-05-05 DIAGNOSIS — I2510 Atherosclerotic heart disease of native coronary artery without angina pectoris: Secondary | ICD-10-CM

## 2018-05-05 DIAGNOSIS — Z992 Dependence on renal dialysis: Secondary | ICD-10-CM

## 2018-05-05 DIAGNOSIS — I358 Other nonrheumatic aortic valve disorders: Secondary | ICD-10-CM

## 2018-05-05 DIAGNOSIS — Z87891 Personal history of nicotine dependence: Secondary | ICD-10-CM

## 2018-05-05 DIAGNOSIS — I120 Hypertensive chronic kidney disease with stage 5 chronic kidney disease or end stage renal disease: Secondary | ICD-10-CM

## 2018-05-05 DIAGNOSIS — B1910 Unspecified viral hepatitis B without hepatic coma: Secondary | ICD-10-CM

## 2018-05-05 DIAGNOSIS — Z21 Asymptomatic human immunodeficiency virus [HIV] infection status: Secondary | ICD-10-CM

## 2018-05-05 DIAGNOSIS — J449 Chronic obstructive pulmonary disease, unspecified: Secondary | ICD-10-CM

## 2018-05-05 DIAGNOSIS — I330 Acute and subacute infective endocarditis: Secondary | ICD-10-CM

## 2018-05-05 DIAGNOSIS — I428 Other cardiomyopathies: Secondary | ICD-10-CM

## 2018-05-05 HISTORY — PX: TEE WITHOUT CARDIOVERSION: SHX5443

## 2018-05-05 LAB — CBC
HCT: 23.3 % — ABNORMAL LOW (ref 39.0–52.0)
HEMOGLOBIN: 7.3 g/dL — AB (ref 13.0–17.0)
MCH: 29.4 pg (ref 26.0–34.0)
MCHC: 31.3 g/dL (ref 30.0–36.0)
MCV: 94 fL (ref 78.0–100.0)
PLATELETS: 161 10*3/uL (ref 150–400)
RBC: 2.48 MIL/uL — AB (ref 4.22–5.81)
RDW: 17.2 % — ABNORMAL HIGH (ref 11.5–15.5)
WBC: 5.6 10*3/uL (ref 4.0–10.5)

## 2018-05-05 LAB — CULTURE, BLOOD (ROUTINE X 2)
SPECIAL REQUESTS: ADEQUATE
Special Requests: ADEQUATE

## 2018-05-05 LAB — HEPATIC FUNCTION PANEL
ALK PHOS: 63 U/L (ref 38–126)
ALT: 9 U/L (ref 0–44)
AST: 13 U/L — AB (ref 15–41)
Albumin: 2.5 g/dL — ABNORMAL LOW (ref 3.5–5.0)
BILIRUBIN DIRECT: 0.1 mg/dL (ref 0.0–0.2)
BILIRUBIN TOTAL: 0.7 mg/dL (ref 0.3–1.2)
Indirect Bilirubin: 0.6 mg/dL (ref 0.3–0.9)
Total Protein: 5.8 g/dL — ABNORMAL LOW (ref 6.5–8.1)

## 2018-05-05 LAB — PREPARE RBC (CROSSMATCH)

## 2018-05-05 SURGERY — ECHOCARDIOGRAM, TRANSESOPHAGEAL
Anesthesia: Moderate Sedation

## 2018-05-05 MED ORDER — CHLORHEXIDINE GLUCONATE CLOTH 2 % EX PADS
6.0000 | MEDICATED_PAD | Freq: Every day | CUTANEOUS | Status: DC
  Administered 2018-05-05: 6 via TOPICAL

## 2018-05-05 MED ORDER — METOPROLOL TARTRATE 5 MG/5ML IV SOLN
INTRAVENOUS | Status: DC | PRN
  Administered 2018-05-05: 5 mg via INTRAVENOUS

## 2018-05-05 MED ORDER — SODIUM CHLORIDE 0.9% IV SOLUTION
Freq: Once | INTRAVENOUS | Status: AC
  Administered 2018-05-05: 18:00:00 via INTRAVENOUS

## 2018-05-05 MED ORDER — MIDAZOLAM HCL 5 MG/ML IJ SOLN
INTRAMUSCULAR | Status: AC
  Filled 2018-05-05: qty 3

## 2018-05-05 MED ORDER — BUTAMBEN-TETRACAINE-BENZOCAINE 2-2-14 % EX AERO
INHALATION_SPRAY | CUTANEOUS | Status: DC | PRN
  Administered 2018-05-05: 2 via TOPICAL

## 2018-05-05 MED ORDER — DIPHENHYDRAMINE HCL 50 MG/ML IJ SOLN
INTRAMUSCULAR | Status: DC | PRN
  Administered 2018-05-05: 25 mg via INTRAVENOUS

## 2018-05-05 MED ORDER — FENTANYL CITRATE (PF) 100 MCG/2ML IJ SOLN
INTRAMUSCULAR | Status: AC
  Filled 2018-05-05: qty 2

## 2018-05-05 MED ORDER — METOPROLOL TARTRATE 5 MG/5ML IV SOLN
INTRAVENOUS | Status: AC
  Filled 2018-05-05: qty 5

## 2018-05-05 MED ORDER — MIDAZOLAM HCL 10 MG/2ML IJ SOLN
INTRAMUSCULAR | Status: DC | PRN
  Administered 2018-05-05: 3 mg via INTRAVENOUS
  Administered 2018-05-05: 2 mg via INTRAVENOUS

## 2018-05-05 MED ORDER — VANCOMYCIN HCL IN DEXTROSE 1-5 GM/200ML-% IV SOLN
1000.0000 mg | INTRAVENOUS | Status: DC
  Administered 2018-05-06: 1000 mg via INTRAVENOUS
  Filled 2018-05-05 (×2): qty 200

## 2018-05-05 MED ORDER — VANCOMYCIN HCL IN DEXTROSE 750-5 MG/150ML-% IV SOLN
750.0000 mg | Freq: Once | INTRAVENOUS | Status: AC
  Administered 2018-05-05: 750 mg via INTRAVENOUS
  Filled 2018-05-05: qty 150

## 2018-05-05 MED ORDER — DIPHENHYDRAMINE HCL 50 MG/ML IJ SOLN
INTRAMUSCULAR | Status: AC
  Filled 2018-05-05: qty 1

## 2018-05-05 MED ORDER — SODIUM CHLORIDE 0.9 % IV SOLN
INTRAVENOUS | Status: DC
  Administered 2018-05-05: 500 mL via INTRAVENOUS

## 2018-05-05 NOTE — Progress Notes (Signed)
Internal Medicine Attending  Date: 05/05/2018  Patient name: Jeffrey [name] Medical record number: [id number] Date of birth: 29-Jan-1956 Age: 62 y.o. Gender: male  I saw and evaluated the patient. I reviewed the resident's note by Dr. Laural [name] and I agree with the resident's findings and plans as documented in her progress note.  When seen on rounds this morning Jeffrey [name] noted some subjective shortness of breath. He estimated this was near his baseline. His hypervolemia has been managed via hemodialysis. TEE today demonstrated severe aortic insufficiency related to a perforation of one of the aortic valve cusps as well as vegetations consistent with endocarditis. This would explain the persistently positive coag negative staph bacteremia. We appreciate cardiology's assessment via TEE and IDs recommendations for 6 weeks of vancomycin at 1 g after each dialysis session. His hemoglobin did not respond as expected to the unit of blood he received in hemodialysis yesterday. We are trying to sort out a source given his history of GI bleeds. That said, with the severe aortic insufficiency and perforated cusp 1 might consider the possibility of hemolysis as well. We will check an LDH and haptoglobin and plan on transfusing 2 units of blood tomorrow during hemodialysis. If he does well I anticipate he will be ready for discharge home tomorrow.

## 2018-05-05 NOTE — Progress Notes (Signed)
  Echocardiogram Echocardiogram Transesophageal has been performed.  Bobbye Charleston 05/05/2018, 9:26 AM

## 2018-05-05 NOTE — H&P (Signed)
[facility]    Date of Admission:  05/01/2018   Total days of antibiotics: 2               Reason for Consult: Antibiotic recommendations  Referring Provider: Dr. Eppie Gibson   Assessment: Jeffrey [name] is a 62 y.o. male with a history of HIV (viral load undetectable 02/09/18), hepatitis B, COPD, ESRD (on HD 4 times a week), HTN, CAD who has a 1.5 week history of progressively worsening SOB and LE edema and found to have MRSE bacteremia with evidence of aortic valve endocarditis.  Plan: #Positive blood cultures, non-aureus methicillin resistant staphlycocci (MRSE): - Echo was notable for severe AI and possible isolated left ventricular non-compaction cardiomyopathy. - 8/15 TEE: Aortic valve: The non-coronary cusp is perforated with resultant severe aortic regurgitation. No abscess noted. There is a small vegetation noted on the non and right coronary cusp. - 8/11 BCID: Methicillin (oxacillin) resistant coagulase negative staphylococcus - 8/11 blood culture: STAPHYLOCOCCUS EPIDERMIDIS - Repeat Blood Cx 8/13: pending - Remained afebrile throughout admission - Patient had leukocytosis of 11.3 on admission but has since down trended to WBC 5.6 8/14 - Patient's MRSE bacteremia is likely secondary to his contamination at fistula and subsequent endocarditis.  - Recommend 6 weeks Vancomycin IV with HD for MRSE aortic valve endocarditis and bacteremia.   #HIV: CD4 count in 01/2018 was undetectable. -Currently on Biktarvy - .jchh  #SOB: - Likely secondary to fluid overload 2/2 renal disease and endocarditis with acute heart dysfunction - Continue HD   #Left-sided Shoulder pain - Left Shoulder plain films showed Degenerative changes without acute abnormality. 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 sodium chloride   Intravenous Once  . bictegravir-emtricitabine-tenofovir AF  1 tablet Oral Daily  . calcium acetate  1,334 mg Oral TID WC  . carvedilol  25 mg Oral BID WC  . Chlorhexidine Gluconate Cloth  6 each Topical Q0600  . Chlorhexidine Gluconate Cloth  6 each Topical Q0600  . Chlorhexidine Gluconate Cloth  6 each Topical Q0600  . cloNIDine  0.2 mg Transdermal Q Tue  . clotrimazole-betamethasone  1 application Topical BID  . darbepoetin (ARANESP) injection - DIALYSIS  150 mcg Intravenous Q Wed-HD  . heparin  5,000 Units Subcutaneous Q8H  . hydrALAZINE  100 mg Oral Q8H  . lacosamide  25 mg Oral Q M,W,F-1800  . lacosamide  75 mg Oral BID  . mouth rinse  15 mL Mouth Rinse BID  . megestrol  40 mg Oral Daily  . multivitamin  1 tablet Oral Daily  . pantoprazole  40 mg Oral Daily  . umeclidinium-vilanterol  1 puff Inhalation Daily    HPI: Jeffrey [name] is a 62 y.o. male with a history of HIV (viral load undetectable 02/09/18), hepatitis B, COPD, ESRD (on HD 4 times a week), HTN, CAD who has a 1.5 week history of progressively worsening SOB and LE edema. Initial CXR 8/12 showed left basilar infiltrate with associated effusion. likely secondary to fluid overload. He underwent hemodialysis in the hospital and noticed improvement in his symptoms. A positive blood culture was found in an anaerobic bottle of the right antecubital  site. Melford Aase identified it as a non-aureus staphylococcal methicillin-resistant cocci. Patient was started on Vanc 570 mg IV with HD was ordered.   Today he endorses shortness of breath but describes it as "no more than usual". He also has persistent left sided shoulder pain that he believes is due to a recent fall from several weeks ago. Denies cough, congestion, fevers, chills, night sweats. Denies chest pain. Denies changes in bowel  movements. He states that he no longer makes urine.    Review of Systems: Review of Systems  Constitutional: Negative for chills, fever and weight loss.  HENT: Negative for sinus pain and sore throat.   Respiratory: Positive for shortness of breath. Negative for cough and hemoptysis.   Cardiovascular: Positive for leg swelling. Negative for chest pain and palpitations.  Gastrointestinal: Negative for constipation, diarrhea, nausea and vomiting.  Genitourinary: Negative for hematuria and urgency.  Musculoskeletal:       Pain of left shoulder.  Skin: Negative for rash.  Neurological: Negative for dizziness and headaches.  All other systems reviewed and are negative.   Past Medical History:  Diagnosis Date  . Anemia   . Anxiety   . Arthritis   . Asthma    per pt hx  . End stage renal disease on home HD 07/10/2011   Started HD in September 2012 at [facility] with a tunneled HD catheter, now on home HD with NxtStage. Dialyzing through AVF L lower arm with buttonhole technique as of mid 2014. His brother does the HD treatments at home.  They are roommates for 23 years.  The brother works 3rd shift and gets off about 8am and then puts Mr [name] on HD in the morning after getting home. Most of the time he does HD about 4 times a week, for about 4 hours per treatment. Cause of ESRD was HTN according to patient. He says he let his health go and ending up with complications, and that he didn't like seeing doctors in those days.  He says he was diagnosed with severe HTN when he lived in [place] in his 25's.   . Hepatitis B carrier ([place])   . HIV infection ([place])   . Hypertension   . Hyperthyroidism    normal now  . Pneumonia several yrs ago  . Seizure ([place])   . Seizures ([place]) 06/02/2011   x 1 none since  . Thrombocytopenia ([facility])     Social History   Tobacco Use  . Smoking status: Former Smoker    Years: 10.00    Types: Cigarettes    Last attempt to quit: 08/08/2015    Years since quitting:  2.7  . Smokeless tobacco: Never Used  . Tobacco comment: casual smoking for 10 years  Substance Use Topics  . Alcohol use: No    Alcohol/week: 0.0 standard drinks    Comment: quit 2004  . Drug use: No    Comment: uds (+) cocaine in 08/2011 but pt states was taking sudafed at the time    Family History  Problem Relation Age of Onset  . Hypertension Mother   . Diabetes Mother   . Cancer Father      Medications: I have reviewed the patient's current medications.  Abtx:  Anti-infectives (From admission, onward)   Start     Dose/Rate Route Frequency Ordered Stop   05/05/18 0800  vancomycin (VANCOCIN) IVPB 750 mg/150 ml premix     750 mg 150 mL/hr over 60 Minutes Intravenous  Once 05/05/18 0608  05/05/18 1207   05/04/18 1200  vancomycin (VANCOCIN) IVPB 750 mg/150 ml premix     750 mg 150 mL/hr over 60 Minutes Intravenous Every M-W-F (Hemodialysis) 05/02/18 2138     05/02/18 2200  vancomycin (VANCOCIN) 1,500 mg in sodium chloride 0.9 % 500 mL IVPB     1,500 mg 250 mL/hr over 120 Minutes Intravenous  Once 05/02/18 2123 05/03/18 0042   05/02/18 1000  bictegravir-emtricitabine-tenofovir AF (BIKTARVY) 50-200-25 MG per tablet 1 tablet     1 tablet Oral Daily 05/02/18 0038     05/01/18 2245  levofloxacin (LEVAQUIN) IVPB 750 mg     750 mg 100 mL/hr over 90 Minutes Intravenous  Once 05/01/18 2235 05/02/18 0052        OBJECTIVE: Blood pressure (!) 174/62, pulse 67, temperature 98.7 F (37.1 C), temperature source Oral, resp. rate 17, height 6' (1.829 m), weight 82.1 kg, SpO2 100 %.  Physical Exam  Constitutional: He appears well-developed and well-nourished.  HENT:  Head: Normocephalic and atraumatic.  Eyes: Pupils are equal, round, and reactive to light. EOM are normal.  Neck: Normal range of motion.  Cardiovascular: Normal rate and regular rhythm.  Pulmonary/Chest: Effort normal and breath sounds normal. No tachypnea. No respiratory distress.  Abdominal: Soft. Bowel sounds are  normal.  Musculoskeletal:       Arms:      Right lower leg: Normal. He exhibits no tenderness and no edema.       Left lower leg: Normal. He exhibits no tenderness and no edema.  Decreased range of motion of left shoulder.   Neurological: He is alert.  Decreased sensation of left shoulder. Normal sensation to left forearm and hand.  Skin: Skin is warm and dry.  Psychiatric: He has a normal mood and affect. His behavior is normal.  Nursing note and vitals reviewed.   Lab Results Results for orders placed or performed during the hospital encounter of 05/01/18 (from the past 48 hour(s))  Renal function panel     Status: Abnormal   Collection Time: 05/04/18  1:27 PM  Result Value Ref Range   Sodium 137 135 - 145 mmol/L   Potassium 4.4 3.5 - 5.1 mmol/L   Chloride 101 98 - 111 mmol/L   CO2 25 22 - 32 mmol/L   Glucose, Bld 99 70 - 99 mg/dL   BUN 53 (H) 8 - 23 mg/dL   Creatinine, Ser 8.16 (H) 0.61 - 1.24 mg/dL   Calcium 8.4 (L) 8.9 - 10.3 mg/dL   Phosphorus 3.7 2.5 - 4.6 mg/dL   Albumin 2.3 (L) 3.5 - 5.0 g/dL   GFR calc non Af Amer 6 (L) >60 mL/min   GFR calc Af Amer 7 (L) >60 mL/min    Comment: (NOTE) The eGFR has been calculated using the CKD EPI equation. This calculation has not been validated in all clinical situations. eGFR's persistently <60 mL/min signify possible Chronic Kidney Disease.    Anion gap 11 5 - 15    Comment: Performed at [facility] [address] [place], [place] [address]  CBC     Status: Abnormal   Collection Time: 05/04/18  1:27 PM  Result Value Ref Range   WBC 7.0 4.0 - 10.5 K/uL   RBC 2.36 (L) 4.22 - 5.81 MIL/uL   Hemoglobin 6.8 (LL) 13.0 - 17.0 g/dL    Comment: REPEATED TO VERIFY CRITICAL RESULT CALLED TO, READ BACK BY AND VERIFIED WITH: DVedia Pereyra RN [phone] [place]  HCT 22.8 (L) 39.0 - 52.0 %   MCV 96.6 78.0 - 100.0 fL   MCH 28.8 26.0 - 34.0 pg   MCHC 29.8 (L) 30.0 - 36.0 g/dL   RDW 17.4 (H) 11.5 - 15.5 %   Platelets 195 150  - 400 K/uL    Comment: Performed at [facility] [address], [place], [place] [address]  Type and screen [place]     Status: None   Collection Time: 05/04/18  3:10 PM  Result Value Ref Range   ABO/RH(D) O NEG    Antibody Screen NEG    Sample Expiration 05/07/2018    Unit Number [id number]    Blood Component Type RBC LR PHER2    Unit division 00    Status of Unit ISSUED,FINAL    Transfusion Status OK TO TRANSFUSE    Crossmatch Result      Compatible Performed at [facility], [place] [address], [place], [place] [address]   Prepare RBC     Status: None   Collection Time: 05/04/18  3:14 PM  Result Value Ref Range   Order Confirmation      ORDER PROCESSED BY BLOOD BANK Performed at [facility], [place] [address] [place], [place] [address]   CBC     Status: Abnormal   Collection Time: 05/05/18  6:08 AM  Result Value Ref Range   WBC 5.6 4.0 - 10.5 K/uL   RBC 2.48 (L) 4.22 - 5.81 MIL/uL   Hemoglobin 7.3 (L) 13.0 - 17.0 g/dL   HCT 23.3 (L) 39.0 - 52.0 %   MCV 94.0 78.0 - 100.0 fL   MCH 29.4 26.0 - 34.0 pg   MCHC 31.3 30.0 - 36.0 g/dL   RDW 17.2 (H) 11.5 - 15.5 %   Platelets 161 150 - 400 K/uL    Comment: Performed at [facility], [place] [address] [place], [place] [address]      Component Value Date/Time   SDES BLOOD BLOOD RIGHT HAND 05/03/2018 4166   SPECREQUEST  05/03/2018 0712    BOTTLES DRAWN AEROBIC AND ANAEROBIC Blood Culture adequate volume   CULT  05/03/2018 0712    NO GROWTH 2 DAYS Performed at [facility], [place] [address] [place], [place] [address]    REPTSTATUS PENDING 05/03/2018 6010   No results found. Recent Results (from the past 240 hour(s))  Culture, blood (routine x 2)     Status: Abnormal   Collection Time: 05/01/18 11:04 PM  Result Value Ref Range Status   Specimen Description BLOOD RIGHT FOREARM  Final   Special Requests   Final    BOTTLES DRAWN AEROBIC AND ANAEROBIC Blood Culture adequate  volume   Culture  Setup Time   Final    IN BOTH AEROBIC AND ANAEROBIC BOTTLES GRAM POSITIVE COCCI CRITICAL VALUE NOTED.  VALUE IS CONSISTENT WITH PREVIOUSLY REPORTED AND CALLED VALUE. Performed at [facility], [place] [address], [place], [place] [address]    Culture STAPHYLOCOCCUS EPIDERMIDIS (A)  Final   Report Status 05/05/2018 FINAL  Final   Organism ID, Bacteria STAPHYLOCOCCUS EPIDERMIDIS  Final      Susceptibility   Staphylococcus epidermidis - MIC*    CIPROFLOXACIN <=0.5 SENSITIVE Sensitive     ERYTHROMYCIN >=8 RESISTANT Resistant     GENTAMICIN <=0.5 SENSITIVE Sensitive     OXACILLIN >=4 RESISTANT Resistant     TETRACYCLINE 2 SENSITIVE Sensitive     VANCOMYCIN 2  SENSITIVE Sensitive     TRIMETH/SULFA >=320 RESISTANT Resistant     CLINDAMYCIN >=8 RESISTANT Resistant     RIFAMPIN <=0.5 SENSITIVE Sensitive     Inducible Clindamycin NEGATIVE Sensitive     * STAPHYLOCOCCUS EPIDERMIDIS  Culture, blood (routine x 2)     Status: Abnormal   Collection Time: 05/01/18 11:24 PM  Result Value Ref Range Status   Specimen Description BLOOD RIGHT ANTECUBITAL  Final   Special Requests   Final    BOTTLES DRAWN AEROBIC AND ANAEROBIC Blood Culture adequate volume   Culture  Setup Time   Final    IN BOTH AEROBIC AND ANAEROBIC BOTTLES GRAM POSITIVE COCCI CRITICAL RESULT CALLED TO, READ BACK BY AND VERIFIED WITH: B MANCHERIL PHARMD 05/02/18 2038 JDW    Culture (A)  Final    STAPHYLOCOCCUS EPIDERMIDIS SUSCEPTIBILITIES PERFORMED ON PREVIOUS CULTURE WITHIN THE LAST 5 DAYS. Performed at [facility], [place] [address], [place], [place] [address]    Report Status 05/05/2018 FINAL  Final  Blood Culture ID Panel (Reflexed)     Status: Abnormal   Collection Time: 05/01/18 11:24 PM  Result Value Ref Range Status   Enterococcus species NOT DETECTED NOT DETECTED Final   Listeria monocytogenes NOT DETECTED NOT DETECTED Final   Staphylococcus species DETECTED (A) NOT DETECTED Final    Comment:  Methicillin (oxacillin) resistant coagulase negative staphylococcus. Possible blood culture contaminant (unless isolated from more than one blood culture draw or clinical case suggests pathogenicity). No antibiotic treatment is indicated for blood  culture contaminants. CRITICAL RESULT CALLED TO, READ BACK BY AND VERIFIED WITH: B MANCHERIL PHARMD 05/02/18 2038    Staphylococcus aureus NOT DETECTED NOT DETECTED Final   Methicillin resistance DETECTED (A) NOT DETECTED Final    Comment: CRITICAL RESULT CALLED TO, READ BACK BY AND VERIFIED WITH: B MANCHERIL PHARMD 05/02/18 2038 JDW    Streptococcus species NOT DETECTED NOT DETECTED Final   Streptococcus agalactiae NOT DETECTED NOT DETECTED Final   Streptococcus pneumoniae NOT DETECTED NOT DETECTED Final   Streptococcus pyogenes NOT DETECTED NOT DETECTED Final   Acinetobacter baumannii NOT DETECTED NOT DETECTED Final   Enterobacteriaceae species NOT DETECTED NOT DETECTED Final   Enterobacter cloacae complex NOT DETECTED NOT DETECTED Final   Escherichia coli NOT DETECTED NOT DETECTED Final   Klebsiella oxytoca NOT DETECTED NOT DETECTED Final   Klebsiella pneumoniae NOT DETECTED NOT DETECTED Final   Proteus species NOT DETECTED NOT DETECTED Final   Serratia marcescens NOT DETECTED NOT DETECTED Final   Haemophilus influenzae NOT DETECTED NOT DETECTED Final   Neisseria meningitidis NOT DETECTED NOT DETECTED Final   Pseudomonas aeruginosa NOT DETECTED NOT DETECTED Final   Candida albicans NOT DETECTED NOT DETECTED Final   Candida glabrata NOT DETECTED NOT DETECTED Final   Candida krusei NOT DETECTED NOT DETECTED Final   Candida parapsilosis NOT DETECTED NOT DETECTED Final   Candida tropicalis NOT DETECTED NOT DETECTED Final    Comment: Performed at [facility], [place]. [address], [place], [place] [address]  MRSA PCR Screening     Status: None   Collection Time: 05/02/18  1:18 AM  Result Value Ref Range Status   MRSA by PCR NEGATIVE NEGATIVE  Final    Comment:        The GeneXpert MRSA Assay (FDA approved for NASAL specimens only), is one component of a comprehensive MRSA colonization surveillance program. It is not intended to diagnose MRSA infection nor to guide or monitor treatment for MRSA infections.  Performed at [facility], [place] [address] [place], [place] [address]   Culture, blood (Routine X 2) w Reflex to ID Panel     Status: Abnormal (Preliminary result)   Collection Time: 05/03/18  7:10 AM  Result Value Ref Range Status   Specimen Description BLOOD RIGHT ANTECUBITAL  Final   Special Requests   Final    BOTTLES DRAWN AEROBIC AND ANAEROBIC Blood Culture adequate volume   Culture  Setup Time   Final    ANAEROBIC BOTTLE ONLY GRAM POSITIVE COCCI CRITICAL VALUE NOTED.  VALUE IS CONSISTENT WITH PREVIOUSLY REPORTED AND CALLED VALUE.    Culture (A)  Final    STAPHYLOCOCCUS EPIDERMIDIS SUSCEPTIBILITIES PERFORMED ON PREVIOUS CULTURE WITHIN THE LAST 5 DAYS. Performed at [facility], [place] [address], [place], [place] [address]    Report Status PENDING  Incomplete  Culture, blood (Routine X 2) w Reflex to ID Panel     Status: None (Preliminary result)   Collection Time: 05/03/18  7:12 AM  Result Value Ref Range Status   Specimen Description BLOOD BLOOD RIGHT HAND  Final   Special Requests   Final    BOTTLES DRAWN AEROBIC AND ANAEROBIC Blood Culture adequate volume   Culture   Final    NO GROWTH 2 DAYS Performed at [facility], [place] [address], [place], [place] [address]    Report Status PENDING  Incomplete    Microbiology: Recent Results (from the past 240 hour(s))  Culture, blood (routine x 2)     Status: Abnormal   Collection Time: 05/01/18 11:04 PM  Result Value Ref Range Status   Specimen Description BLOOD RIGHT FOREARM  Final   Special Requests   Final    BOTTLES DRAWN AEROBIC AND ANAEROBIC Blood Culture adequate volume   Culture  Setup Time   Final    IN BOTH AEROBIC AND ANAEROBIC  BOTTLES GRAM POSITIVE COCCI CRITICAL VALUE NOTED.  VALUE IS CONSISTENT WITH PREVIOUSLY REPORTED AND CALLED VALUE. Performed at [facility], [place] [address] [place], [place] [address]    Culture STAPHYLOCOCCUS EPIDERMIDIS (A)  Final   Report Status 05/05/2018 FINAL  Final   Organism ID, Bacteria STAPHYLOCOCCUS EPIDERMIDIS  Final      Susceptibility   Staphylococcus epidermidis - MIC*    CIPROFLOXACIN <=0.5 SENSITIVE Sensitive     ERYTHROMYCIN >=8 RESISTANT Resistant     GENTAMICIN <=0.5 SENSITIVE Sensitive     OXACILLIN >=4 RESISTANT Resistant     TETRACYCLINE 2 SENSITIVE Sensitive     VANCOMYCIN 2 SENSITIVE Sensitive     TRIMETH/SULFA >=320 RESISTANT Resistant     CLINDAMYCIN >=8 RESISTANT Resistant     RIFAMPIN <=0.5 SENSITIVE Sensitive     Inducible Clindamycin NEGATIVE Sensitive     * STAPHYLOCOCCUS EPIDERMIDIS  Culture, blood (routine x 2)     Status: Abnormal   Collection Time: 05/01/18 11:24 PM  Result Value Ref Range Status   Specimen Description BLOOD RIGHT ANTECUBITAL  Final   Special Requests   Final    BOTTLES DRAWN AEROBIC AND ANAEROBIC Blood Culture adequate volume   Culture  Setup Time   Final    IN BOTH AEROBIC AND ANAEROBIC BOTTLES GRAM POSITIVE COCCI CRITICAL RESULT CALLED TO, READ BACK BY AND VERIFIED WITH: B MANCHERIL PHARMD 05/02/18 2038 JDW    Culture (A)  Final    STAPHYLOCOCCUS EPIDERMIDIS SUSCEPTIBILITIES PERFORMED ON PREVIOUS CULTURE WITHIN THE LAST 5 DAYS. Performed at [facility], [address] [place], [place] [address]    Report Status 05/05/2018 FINAL  Final  Blood Culture ID Panel (Reflexed)     Status: Abnormal   Collection Time: 05/01/18 11:24 PM  Result Value Ref Range Status   Enterococcus species NOT DETECTED NOT DETECTED Final   Listeria monocytogenes NOT DETECTED NOT DETECTED Final   Staphylococcus species DETECTED (A) NOT DETECTED Final    Comment: Methicillin (oxacillin) resistant coagulase negative staphylococcus.  Possible blood culture contaminant (unless isolated from more than one blood culture draw or clinical case suggests pathogenicity). No antibiotic treatment is indicated for blood  culture contaminants. CRITICAL RESULT CALLED TO, READ BACK BY AND VERIFIED WITH: B MANCHERIL PHARMD 05/02/18 2038    Staphylococcus aureus NOT DETECTED NOT DETECTED Final   Methicillin resistance DETECTED (A) NOT DETECTED Final    Comment: CRITICAL RESULT CALLED TO, READ BACK BY AND VERIFIED WITH: B MANCHERIL PHARMD 05/02/18 2038 JDW    Streptococcus species NOT DETECTED NOT DETECTED Final   Streptococcus agalactiae NOT DETECTED NOT DETECTED Final   Streptococcus pneumoniae NOT DETECTED NOT DETECTED Final   Streptococcus pyogenes NOT DETECTED NOT DETECTED Final   Acinetobacter baumannii NOT DETECTED NOT DETECTED Final   Enterobacteriaceae species NOT DETECTED NOT DETECTED Final   Enterobacter cloacae complex NOT DETECTED NOT DETECTED Final   Escherichia coli NOT DETECTED NOT DETECTED Final   Klebsiella oxytoca NOT DETECTED NOT DETECTED Final   Klebsiella pneumoniae NOT DETECTED NOT DETECTED Final   Proteus species NOT DETECTED NOT DETECTED Final   Serratia marcescens NOT DETECTED NOT DETECTED Final   Haemophilus influenzae NOT DETECTED NOT DETECTED Final   Neisseria meningitidis NOT DETECTED NOT DETECTED Final   Pseudomonas aeruginosa NOT DETECTED NOT DETECTED Final   Candida albicans NOT DETECTED NOT DETECTED Final   Candida glabrata NOT DETECTED NOT DETECTED Final   Candida krusei NOT DETECTED NOT DETECTED Final   Candida parapsilosis NOT DETECTED NOT DETECTED Final   Candida tropicalis NOT DETECTED NOT DETECTED Final    Comment: Performed at [facility], [place]. [address] [place], [place] [address]  MRSA PCR Screening     Status: None   Collection Time: 05/02/18  1:18 AM  Result Value Ref Range Status   MRSA by PCR NEGATIVE NEGATIVE Final    Comment:        The GeneXpert MRSA Assay (FDA approved  for NASAL specimens only), is one component of a comprehensive MRSA colonization surveillance program. It is not intended to diagnose MRSA infection nor to guide or monitor treatment for MRSA infections. Performed at [facility], [place] [address], [place], [place] [address]   Culture, blood (Routine X 2) w Reflex to ID Panel     Status: Abnormal (Preliminary result)   Collection Time: 05/03/18  7:10 AM  Result Value Ref Range Status   Specimen Description BLOOD RIGHT ANTECUBITAL  Final   Special Requests   Final    BOTTLES DRAWN AEROBIC AND ANAEROBIC Blood Culture adequate volume   Culture  Setup Time   Final    ANAEROBIC BOTTLE ONLY GRAM POSITIVE COCCI CRITICAL VALUE NOTED.  VALUE IS CONSISTENT WITH PREVIOUSLY REPORTED AND CALLED VALUE.    Culture (A)  Final    STAPHYLOCOCCUS EPIDERMIDIS SUSCEPTIBILITIES PERFORMED ON PREVIOUS CULTURE WITHIN THE LAST 5 DAYS. Performed at [facility], [place] [address], [place], [place] [address]    Report Status PENDING  Incomplete  Culture, blood (Routine X 2) w Reflex to ID Panel  Status: None (Preliminary result)   Collection Time: 05/03/18  7:12 AM  Result Value Ref Range Status   Specimen Description BLOOD BLOOD RIGHT HAND  Final   Special Requests   Final    BOTTLES DRAWN AEROBIC AND ANAEROBIC Blood Culture adequate volume   Culture   Final    NO GROWTH 2 DAYS Performed at [facility], [address], [place], [place] [address]    Report Status PENDING  Incomplete    Radiographs and labs were personally reviewed by me.   Carroll Sage, MD 05/05/2018, 2:47 PM

## 2018-05-05 NOTE — Interval H&P Note (Signed)
History and Physical Interval Note:  05/05/2018 8:53 AM  Jeffrey [name]  has presented today for surgery, with the diagnosis of sepsis  The various methods of treatment have been discussed with the patient and family. After consideration of risks, benefits and other options for treatment, the patient has consented to  Procedure(s) with comments: TRANSESOPHAGEAL ECHOCARDIOGRAM (TEE) (N/A) - Prefer after 3:30 PM as a surgical intervention .  The patient's history has been reviewed, patient examined, no change in status, stable for surgery.  I have reviewed the patient's chart and labs.  Questions were answered to the patient's satisfaction.     Adrian Prows

## 2018-05-05 NOTE — Progress Notes (Signed)
Subjective: Interval History: has complaints hungry after procedure.  Objective: Vital signs in last 24 hours: Temp:  [98.4 F (36.9 C)-99.7 F (37.6 C)] 98.7 F (37.1 C) (08/15 0922) Pulse Rate:  [67-81] 67 (08/15 0940) Resp:  [17-58] 17 (08/15 0940) BP: (105-224)/(62-193) 174/62 (08/15 0940) SpO2:  [97 %-100 %] 100 % (08/15 0940) Weight:  [82.1 kg-86 kg] 82.1 kg (08/14 1725) Weight change:   Intake/Output from previous day: 08/14 0701 - 08/15 0700 In: 1202 [P.O.:600; Blood:602] Out: 4000  Intake/Output this shift: Total I/O In: 50 [I.V.:50] Out: -   General appearance: alert, cooperative, no distress and moderately obese Resp: clear to auscultation bilaterally Cardio: S1, S2 normal and systolic murmur: systolic ejection 2/6, decrescendo at 2nd left intercostal space GI: soft, liver down 4 cm, pos bs Extremities: AVF L FA  Lab Results: Recent Labs    05/04/18 1327  WBC 7.0  HGB 6.8*  HCT 22.8*  PLT 195   BMET:  Recent Labs    05/04/18 1327  NA 137  K 4.4  CL 101  CO2 25  GLUCOSE 99  BUN 53*  CREATININE 8.16*  CALCIUM 8.4*   No results for input(s): PTH in the last 72 hours. Iron Studies: No results for input(s): IRON, TIBC, TRANSFERRIN, FERRITIN in the last 72 hours.  Studies/Results: No results found.  I have reviewed the patient's current medications.  Assessment/Plan: 1 ESRD for HD, did well with extra vol off yest. Wgt s not reliable 2 anemia TX, ^ esa , check Fe 3 HPTH vit D if needs 4 HTN lower vol 5 HIV 6 Hep B 7Pos bc per primary.  ? Valve veg P HD, vanco , esa, check Fe    LOS: 4 days   Jeffrey [name] 05/05/2018,10:09 AM

## 2018-05-05 NOTE — Progress Notes (Signed)
Pharmacy Antibiotic Note  Jeffrey [name] is a 62 y.o. male admitted on 05/01/2018 with bacteremia.  Pharmacy has been consulted for vancomycin dosing. Of note patient has a h/o ESRD on home HD four times per week. Blood cultures grew coagulase negative staph and TEE showed vegetation.  Plan: -Increase vancomycin to 1000 mg IV with each HD session x6 weeks per ID -Monitor CBC, cultures, ECHO, clinical progress -F/u OPAT  -Plan to check pre-HD level. Will do inpt if here or do outpt at steady state  Height: 6' (182.9 cm) Weight: 181 lb (82.1 kg) IBW/kg (Calculated) : 77.6  Temp (24hrs), Avg:99 F (37.2 C), Min:98.7 F (37.1 C), Max:99.7 F (37.6 C)  Recent Labs  Lab 05/01/18 2117 05/02/18 0409 05/04/18 1327 05/05/18 0608  WBC 11.3*  --  7.0 5.6  CREATININE 9.27* 9.62* 8.16*  --     Estimated Creatinine Clearance: 10 mL/min (A) (by C-G formula based on SCr of 8.16 mg/dL (H)).    Allergies  Allergen Reactions  . Lisinopril Anaphylaxis and Shortness Of Breath    Throat swelling  . Penicillins Anaphylaxis and Other (See Comments)    Childhood allergy Has patient had a PCN reaction causing immediate rash, facial/tongue/throat swelling, SOB or lightheadedness with hypotension: Yes Has patient had a PCN reaction causing severe rash involving mucus membranes or skin necrosis: Unk Has patient had a PCN reaction that required hospitalization: Unk Has patient had a PCN reaction occurring within the last 10 years: No If all of the above answers are "NO", then may proceed with Cephalosporin use.   Marland Kitchen Morphine And Related Other (See Comments)    "Not Himself"    Antimicrobials this admission: Vancomycin 8/12 >>   Dose adjustments this admission: None   Microbiology results: 8/12 BCx: 2/4 CONS    Thank you for allowing pharmacy to be a part of this patient's care.  Juanell Fairly, PharmD PGY1 Pharmacy Resident Phone [phone] 05/05/2018 4:31 PM

## 2018-05-05 NOTE — Progress Notes (Signed)
   Subjective: Jeffrey [name] reported feeling tired today. He is still experiencing some SOB but has improved since he was admitted. He is able to ambulate without needing [place] most of the time. He denied any chest pain, cough, nausea, fevers, or chills. He denied any blood in his stool or dark stools.   Objective:  Vital signs in last 24 hours: Vitals:   05/05/18 0915 05/05/18 0922 05/05/18 0930 05/05/18 0940  BP: (!) 201/70 (!) 212/82 (!) 197/74 (!) 174/62  Pulse: 80 72 72 67  Resp: (!) 24 (!) 25 (!) 58 17  Temp:  98.7 F (37.1 C)    TempSrc:  Oral    SpO2: 100% 100% 100% 100%  Weight:      Height:       Physical Exam  Constitutional: He is oriented to person, place, and time and well-developed, well-nourished, and in no distress.  Cardiovascular: Normal rate, regular rhythm, normal heart sounds and intact distal pulses.  No murmur heard. Pulmonary/Chest: Effort normal and breath sounds normal. No respiratory distress. He has no wheezes. He has no rales.  Musculoskeletal: He exhibits no edema.  Neurological: He is alert and oriented to person, place, and time.    Assessment/Plan:  Principal Problem:   Coagulase negative Staphylococcus bacteremia Active Problems:   ESRD needing dialysis ([place])   Hypervolemia   ESRD on hemodialysis ([place])   Asymptomatic HIV infection (HCC)   Left ventricular noncompaction cardiomyopathy [facility])  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   Volume overload in the setting of ESRD on home FG:[id number] on exam today. HD tomorrow. Dry weight adjustments made during nephro. -follow with nephro -serial HD   Positive blood cultures, non-aureus methicillin resistant staphlycocci: TEE showed the non-coronary  cusp is perforated with resultant severe aortic regurgitation. There is a small vegetation noted on the non and right coronary cusp. Repeat BC pending. He has remained afebrile and WBC trended down to 5.6 from 11.3. ID recommending 6 weeks vancomycin IV with HD for MRSE aortic valve endocarditis and bacteremia. -f/u repeat blood cultures -continue vancomycin 750 mg IV with HD   Anemia:  Patient's hemoglobin was 8.1 on admission and dropped to 6.8 on 8/14. He received 1 unit of blood during HD and repeat CBC showed Hbg of 7.3 today. Patient denies any blood in stool/dark stool. Concerning for hemolysis due to TEE findings of endocarditis. Will give 2 units of PRBC's during HD tomorrow. -f/u cbc  -f/u liver panel  -2 units of PRBCs during HD   Shortness of breath:  Patient doing well on room air, SpO2 100. Ambulating with little SOB and not requiring [place]. -continue albuterol Q6H prn -continueanoro ellipta tomorrow, 1 puff daily   HTN: 174/62 -changed hydralazine to 50 mg Q8H  -continue carvedilol 25 mg BID  -continue clonidine 0.2 mg transdermal    Dispo: Anticipated discharge in approximately 1 day(s).   Rehman, Areeg N, DO 05/05/2018, 2:07 PM Pager: [phone]

## 2018-05-05 NOTE — CV Procedure (Signed)
TEE: Under moderate sedation 3 mg versed and 25 mg Benadryl, TEE was performed without complications:  LV: Low Normal LVEF. Mild LV dilatation. Severe LVH.  RV: Normal LA: Normal. Left atrial appendage: Normal without thrombus. Normal function. Inter atrial septum is intact without defect. Double contrast study not done. RA: Normal, Rudimentary Eustachian valve noted.  MV: Normal Mild  MR. TV: Normal Trace TR AV: Tricuspid. The non-coronary cusp is perforated with resultant severe aortic regurgitation. No abscess noted. There is a small vegetation noted on the non and right coronary cusp. PV: Normal. Trace PI.  Thoracic and ascending aorta: Normal with very mild mixed plaque plaque in the descending thoracic aorta.  Conscious sedation protocol was followed, I personally administered conscious sedation and monitored the patient. Patient received 3 milligrams of Versed and 25 mg benadryl . Patient tolerated the procedure well and there was no complication from conscious sedation. Time administered was 21 minutes.

## 2018-05-06 DIAGNOSIS — Z880 Allergy status to penicillin: Secondary | ICD-10-CM

## 2018-05-06 DIAGNOSIS — I330 Acute and subacute infective endocarditis: Secondary | ICD-10-CM

## 2018-05-06 DIAGNOSIS — Z9115 Patient's noncompliance with renal dialysis: Secondary | ICD-10-CM

## 2018-05-06 DIAGNOSIS — B9562 Methicillin resistant Staphylococcus aureus infection as the cause of diseases classified elsewhere: Secondary | ICD-10-CM

## 2018-05-06 DIAGNOSIS — Z95828 Presence of other vascular implants and grafts: Secondary | ICD-10-CM

## 2018-05-06 DIAGNOSIS — Z888 Allergy status to other drugs, medicaments and biological substances status: Secondary | ICD-10-CM

## 2018-05-06 DIAGNOSIS — Z885 Allergy status to narcotic agent status: Secondary | ICD-10-CM

## 2018-05-06 DIAGNOSIS — B957 Other staphylococcus as the cause of diseases classified elsewhere: Secondary | ICD-10-CM

## 2018-05-06 LAB — CBC
HEMATOCRIT: 28.4 % — AB (ref 39.0–52.0)
HEMOGLOBIN: 8.9 g/dL — AB (ref 13.0–17.0)
MCH: 29.4 pg (ref 26.0–34.0)
MCHC: 31.3 g/dL (ref 30.0–36.0)
MCV: 93.7 fL (ref 78.0–100.0)
Platelets: 164 10*3/uL (ref 150–400)
RBC: 3.03 MIL/uL — ABNORMAL LOW (ref 4.22–5.81)
RDW: 16.5 % — ABNORMAL HIGH (ref 11.5–15.5)
WBC: 6.9 10*3/uL (ref 4.0–10.5)

## 2018-05-06 LAB — CULTURE, BLOOD (ROUTINE X 2): Special Requests: ADEQUATE

## 2018-05-06 LAB — RENAL FUNCTION PANEL
ALBUMIN: 2.4 g/dL — AB (ref 3.5–5.0)
ANION GAP: 11 (ref 5–15)
BUN: 38 mg/dL — ABNORMAL HIGH (ref 8–23)
CO2: 26 mmol/L (ref 22–32)
Calcium: 8.1 mg/dL — ABNORMAL LOW (ref 8.9–10.3)
Chloride: 100 mmol/L (ref 98–111)
Creatinine, Ser: 6.24 mg/dL — ABNORMAL HIGH (ref 0.61–1.24)
GFR calc Af Amer: 10 mL/min — ABNORMAL LOW (ref >60)
GFR, EST NON AFRICAN AMERICAN: 8 mL/min — AB (ref >60)
GLUCOSE: 92 mg/dL (ref 70–99)
PHOSPHORUS: 2.5 mg/dL (ref 2.5–4.6)
Potassium: 3.8 mmol/L (ref 3.5–5.1)
Sodium: 137 mmol/L (ref 135–145)

## 2018-05-06 LAB — LACTATE DEHYDROGENASE: LDH: 129 U/L (ref 98–192)

## 2018-05-06 LAB — HEMOGLOBIN AND HEMATOCRIT, BLOOD
HCT: 29.5 % — ABNORMAL LOW (ref 39.0–52.0)
HEMOGLOBIN: 9.5 g/dL — AB (ref 13.0–17.0)

## 2018-05-06 MED ORDER — VANCOMYCIN HCL IN DEXTROSE 1-5 GM/200ML-% IV SOLN: 1000.0000 mg | INTRAVENOUS | 0 refills | Status: DC

## 2018-05-06 MED ORDER — LIDOCAINE HCL (PF) 1 % IJ SOLN: 5.0000 mL | INTRAMUSCULAR | Status: DC | PRN

## 2018-05-06 MED ORDER — ALTEPLASE 2 MG IJ SOLR: 2.0000 mg | Freq: Once | INTRAMUSCULAR | Status: DC | PRN

## 2018-05-06 MED ORDER — RENA-VITE PO TABS: 1.0000 | ORAL_TABLET | Freq: Every day | ORAL | Status: DC

## 2018-05-06 MED ORDER — PENTAFLUOROPROP-TETRAFLUOROETH EX AERO: 1.0000 "application " | INHALATION_SPRAY | CUTANEOUS | Status: DC | PRN

## 2018-05-06 MED ORDER — VANCOMYCIN IV (FOR PTA / DISCHARGE USE ONLY): 750.0000 mg | INTRAVENOUS | 0 refills | Status: AC | PRN

## 2018-05-06 MED ORDER — LIDOCAINE-PRILOCAINE 2.5-2.5 % EX CREA: 1.0000 "application " | TOPICAL_CREAM | CUTANEOUS | Status: DC | PRN

## 2018-05-06 MED ORDER — SODIUM CHLORIDE 0.9 % IV SOLN: 100.0000 mL | INTRAVENOUS | Status: DC | PRN

## 2018-05-06 MED ORDER — HEPARIN SODIUM (PORCINE) 1000 UNIT/ML DIALYSIS: 4000.0000 [IU] | Freq: Once | INTRAMUSCULAR | Status: DC

## 2018-05-06 MED ORDER — HEPARIN SODIUM (PORCINE) 1000 UNIT/ML DIALYSIS: 1000.0000 [IU] | INTRAMUSCULAR | Status: DC | PRN

## 2018-05-06 MED ORDER — LACOSAMIDE 50 MG PO TABS: 25.0000 mg | ORAL_TABLET | ORAL | 0 refills | Status: DC

## 2018-05-06 MED ORDER — HEPARIN SODIUM (PORCINE) 1000 UNIT/ML DIALYSIS
100.0000 [IU]/kg | INTRAMUSCULAR | Status: DC | PRN
  Filled 2018-05-06: qty 9

## 2018-05-06 NOTE — Progress Notes (Signed)
Subjective: Interval History: has no complaint .  Objective: Vital signs in last 24 hours: Temp:  [97.8 F (36.6 C)-99.3 F (37.4 C)] 99.2 F (37.3 C) (08/16 0840) Pulse Rate:  [74-90] 82 (08/16 0945) Resp:  [16-20] 20 (08/16 0504) BP: (104-203)/(67-152) 195/96 (08/16 0945) SpO2:  [95 %-100 %] 100 % (08/16 0840) Weight:  [87.1 kg] 87.1 kg (08/16 0840) Weight change:   Intake/Output from previous day: 08/15 0701 - 08/16 0700 In: 845 [P.O.:480; I.V.:50; Blood:315] Out: 0  Intake/Output this shift: No intake/output data recorded.  General appearance: alert, cooperative and no distress Resp: diminished breath sounds bilaterally and rales bibasilar Cardio: S1, S2 normal and systolic murmur: systolic ejection 2/6, decrescendo at 2nd left intercostal space GI: pos bs, soft, liver down 5 cm Extremities: LLA AVF  Lab Results: Recent Labs    05/04/18 1327 05/05/18 0608 05/06/18 0439  WBC 7.0 5.6  --   HGB 6.8* 7.3* 9.5*  HCT 22.8* 23.3* 29.5*  PLT 195 161  --    BMET:  Recent Labs    05/04/18 1327  NA 137  K 4.4  CL 101  CO2 25  GLUCOSE 99  BUN 53*  CREATININE 8.16*  CALCIUM 8.4*   No results for input(s): PTH in the last 72 hours. Iron Studies: No results for input(s): IRON, TIBC, TRANSFERRIN, FERRITIN in the last 72 hours.  Studies/Results: No results found.  I have reviewed the patient's current medications.  Assessment/Plan: 1 ESRD HD, vol xs. 2 HTN needs more vol off 3 Anemia Hb ^without TX on ESA 4 HPTH vit D 5 Hep B 6 HIV 7 Endocarditis Needs 6 wk iv AB.  This is complicated by his Home HD, 4X/wk  You willneed to contact his home training coordinator P HD ,esa, AB, lower vol    LOS: 5 days   Jeffrey [name] 05/06/2018,9:53 AM

## 2018-05-06 NOTE — Telephone Encounter (Signed)
Patient was admitted to hospital on 05-01-18 for elevated troponin levels.  Please advise on the voltaren 1% gel refill request.

## 2018-05-06 NOTE — Progress Notes (Signed)
PHARMACY CONSULT NOTE FOR:  OUTPATIENT  PARENTERAL ANTIBIOTIC THERAPY (OPAT)  Indication: MRSE bacteremia/endocarditis  Regimen: Vancomycin 750 mg on dialysis days (Please ensure that patient maintains a regular dialysis schedule while on Vancomycin at home) End date: 06/13/18  IV antibiotic discharge orders are pended. To discharging provider:  please sign these orders via discharge navigator,  Select New Orders & click on the button choice - Manage This Unsigned Work.     Thank you for allowing pharmacy to be a part of this patient's care.  Susa Raring, PharmD, BCPS Infectious Diseases Clinical Pharmacist  Phone: [phone] 05/06/2018, 2:28 PM

## 2018-05-06 NOTE — Progress Notes (Addendum)
INFECTIOUS DISEASE PROGRESS NOTE  ID: ELAINE MIDDLETON is a 62 y.o. male with  Principal Problem:   Coagulase negative Staphylococcus bacteremia Active Problems:   ESRD needing dialysis ([place])   Hypervolemia   ESRD on hemodialysis ([place])   Asymptomatic HIV infection ([place])   Left ventricular noncompaction cardiomyopathy ([place])   Acute bacterial endocarditis   Aortic valve endocarditis   Left shoulder pain  Subjective: Patient reports feeling fatigued since starting dialysis this morning. He denies any other acute complaints.   Abtx:  Anti-infectives (From admission, onward)   Start     Dose/Rate Route Frequency Ordered Stop   05/06/18 1200  vancomycin (VANCOCIN) IVPB 1000 mg/200 mL premix     1,000 mg 200 mL/hr over 60 Minutes Intravenous Every M-W-F (Hemodialysis) 05/05/18 1633     05/05/18 0800  vancomycin (VANCOCIN) IVPB 750 mg/150 ml premix     750 mg 150 mL/hr over 60 Minutes Intravenous  Once 05/05/18 0608 05/05/18 1207   05/04/18 1200  vancomycin (VANCOCIN) IVPB 750 mg/150 ml premix  Status:  Discontinued     750 mg 150 mL/hr over 60 Minutes Intravenous Every M-W-F (Hemodialysis) 05/02/18 2138 05/05/18 1633   05/02/18 2200  vancomycin (VANCOCIN) 1,500 mg in sodium chloride 0.9 % 500 mL IVPB     1,500 mg 250 mL/hr over 120 Minutes Intravenous  Once 05/02/18 2123 05/03/18 0042   05/02/18 1000  bictegravir-emtricitabine-tenofovir AF (BIKTARVY) 50-200-25 MG per tablet 1 tablet     1 tablet Oral Daily 05/02/18 0038     05/01/18 2245  levofloxacin (LEVAQUIN) IVPB 750 mg     750 mg 100 mL/hr over 90 Minutes Intravenous  Once 05/01/18 2235 05/02/18 0052      Medications: I have reviewed the patient's current medications.  Objective: Vital signs in last 24 hours: Temp:  [97.8 F (36.6 C)-99.3 F (37.4 C)] 99.2 F (37.3 C) (08/16 0840) Pulse Rate:  [67-90] 86 (08/16 0900) Resp:  [16-58] 20 (08/16 0504) BP: (104-212)/(62-152) 203/103 (08/16 0900) SpO2:  [95 %-100 %] 100  % (08/16 0840) Weight:  [87.1 kg] 87.1 kg (08/16 0840)  Physical Exam: Constitutional: He appears well-developed and well-nourished.  HENT:  Head: Normocephalic and atraumatic.  Eyes: Pupils are equal, round, and reactive to light. EOM are normal.  Neck: Normal range of motion.  Cardiovascular: Normal rate and regular rhythm.  Pulmonary/Chest: Effort normal and breath sounds normal. No tachypnea. No respiratory distress.  Abdominal: Soft. Bowel sounds are normal.  Musculoskeletal:       Arms: Neurological: He is alert.  Skin: Skin is warm and dry.  Psychiatric: He has a normal mood and affect. His behavior is normal.  Nursing note and vitals reviewed.  Lab Results Recent Labs    05/04/18 1327 05/05/18 0608 05/06/18 0439  WBC 7.0 5.6  --   HGB 6.8* 7.3* 9.5*  HCT 22.8* 23.3* 29.5*  NA 137  --   --   K 4.4  --   --   CL 101  --   --   CO2 25  --   --   BUN 53*  --   --   CREATININE 8.16*  --   --    Liver Panel Recent Labs    05/04/18 1327 05/05/18 1658  PROT  --  5.8*  ALBUMIN 2.3* 2.5*  AST  --  13*  ALT  --  9  ALKPHOS  --  63  BILITOT  --  0.7  BILIDIR  --  0.1  IBILI  --  0.6   Sedimentation Rate No results for input(s): ESRSEDRATE in the last 72 hours. C-Reactive Protein No results for input(s): CRP in the last 72 hours.  Microbiology: Recent Results (from the past 240 hour(s))  Culture, blood (routine x 2)     Status: Abnormal   Collection Time: 05/01/18 11:04 PM  Result Value Ref Range Status   Specimen Description BLOOD RIGHT FOREARM  Final   Special Requests   Final    BOTTLES DRAWN AEROBIC AND ANAEROBIC Blood Culture adequate volume   Culture  Setup Time   Final    IN BOTH AEROBIC AND ANAEROBIC BOTTLES GRAM POSITIVE COCCI CRITICAL VALUE NOTED.  VALUE IS CONSISTENT WITH PREVIOUSLY REPORTED AND CALLED VALUE. Performed at [facility], [place] [address] [place], [place] [address]    Culture STAPHYLOCOCCUS EPIDERMIDIS (A)  Final   Report  Status 05/05/2018 FINAL  Final   Organism ID, Bacteria STAPHYLOCOCCUS EPIDERMIDIS  Final      Susceptibility   Staphylococcus epidermidis - MIC*    CIPROFLOXACIN <=0.5 SENSITIVE Sensitive     ERYTHROMYCIN >=8 RESISTANT Resistant     GENTAMICIN <=0.5 SENSITIVE Sensitive     OXACILLIN >=4 RESISTANT Resistant     TETRACYCLINE 2 SENSITIVE Sensitive     VANCOMYCIN 2 SENSITIVE Sensitive     TRIMETH/SULFA >=320 RESISTANT Resistant     CLINDAMYCIN >=8 RESISTANT Resistant     RIFAMPIN <=0.5 SENSITIVE Sensitive     Inducible Clindamycin NEGATIVE Sensitive     * STAPHYLOCOCCUS EPIDERMIDIS  Culture, blood (routine x 2)     Status: Abnormal   Collection Time: 05/01/18 11:24 PM  Result Value Ref Range Status   Specimen Description BLOOD RIGHT ANTECUBITAL  Final   Special Requests   Final    BOTTLES DRAWN AEROBIC AND ANAEROBIC Blood Culture adequate volume   Culture  Setup Time   Final    IN BOTH AEROBIC AND ANAEROBIC BOTTLES GRAM POSITIVE COCCI CRITICAL RESULT CALLED TO, READ BACK BY AND VERIFIED WITH: B MANCHERIL PHARMD 05/02/18 2038 JDW    Culture (A)  Final    STAPHYLOCOCCUS EPIDERMIDIS SUSCEPTIBILITIES PERFORMED ON PREVIOUS CULTURE WITHIN THE LAST 5 DAYS. Performed at [facility], [place] [address], [place], [place] [address]    Report Status 05/05/2018 FINAL  Final  Blood Culture ID Panel (Reflexed)     Status: Abnormal   Collection Time: 05/01/18 11:24 PM  Result Value Ref Range Status   Enterococcus species NOT DETECTED NOT DETECTED Final   Listeria monocytogenes NOT DETECTED NOT DETECTED Final   Staphylococcus species DETECTED (A) NOT DETECTED Final    Comment: Methicillin (oxacillin) resistant coagulase negative staphylococcus. Possible blood culture contaminant (unless isolated from more than one blood culture draw or clinical case suggests pathogenicity). No antibiotic treatment is indicated for blood  culture contaminants. CRITICAL RESULT CALLED TO, READ BACK BY AND VERIFIED  WITH: B MANCHERIL PHARMD 05/02/18 2038    Staphylococcus aureus NOT DETECTED NOT DETECTED Final   Methicillin resistance DETECTED (A) NOT DETECTED Final    Comment: CRITICAL RESULT CALLED TO, READ BACK BY AND VERIFIED WITH: B MANCHERIL PHARMD 05/02/18 2038 JDW    Streptococcus species NOT DETECTED NOT DETECTED Final   Streptococcus agalactiae NOT DETECTED NOT DETECTED Final   Streptococcus pneumoniae NOT DETECTED NOT DETECTED Final   Streptococcus pyogenes NOT DETECTED NOT DETECTED Final   Acinetobacter baumannii NOT DETECTED NOT DETECTED Final   Enterobacteriaceae species NOT DETECTED NOT DETECTED  Final   Enterobacter cloacae complex NOT DETECTED NOT DETECTED Final   Escherichia coli NOT DETECTED NOT DETECTED Final   Klebsiella oxytoca NOT DETECTED NOT DETECTED Final   Klebsiella pneumoniae NOT DETECTED NOT DETECTED Final   Proteus species NOT DETECTED NOT DETECTED Final   Serratia marcescens NOT DETECTED NOT DETECTED Final   Haemophilus influenzae NOT DETECTED NOT DETECTED Final   Neisseria meningitidis NOT DETECTED NOT DETECTED Final   Pseudomonas aeruginosa NOT DETECTED NOT DETECTED Final   Candida albicans NOT DETECTED NOT DETECTED Final   Candida glabrata NOT DETECTED NOT DETECTED Final   Candida krusei NOT DETECTED NOT DETECTED Final   Candida parapsilosis NOT DETECTED NOT DETECTED Final   Candida tropicalis NOT DETECTED NOT DETECTED Final    Comment: Performed at [facility], [place] [address] [place], [place] [address]  MRSA PCR Screening     Status: None   Collection Time: 05/02/18  1:18 AM  Result Value Ref Range Status   MRSA by PCR NEGATIVE NEGATIVE Final    Comment:        The GeneXpert MRSA Assay (FDA approved for NASAL specimens only), is one component of a comprehensive MRSA colonization surveillance program. It is not intended to diagnose MRSA infection nor to guide or monitor treatment for MRSA infections. Performed at [facility], [place]  [address], [place], [place] [address]   Culture, blood (Routine X 2) w Reflex to ID Panel     Status: Abnormal   Collection Time: 05/03/18  7:10 AM  Result Value Ref Range Status   Specimen Description BLOOD RIGHT ANTECUBITAL  Final   Special Requests   Final    BOTTLES DRAWN AEROBIC AND ANAEROBIC Blood Culture adequate volume   Culture  Setup Time   Final    ANAEROBIC BOTTLE ONLY GRAM POSITIVE COCCI CRITICAL VALUE NOTED.  VALUE IS CONSISTENT WITH PREVIOUSLY REPORTED AND CALLED VALUE.    Culture (A)  Final    STAPHYLOCOCCUS EPIDERMIDIS SUSCEPTIBILITIES PERFORMED ON PREVIOUS CULTURE WITHIN THE LAST 5 DAYS. Performed at [facility], [place] [address] [place], [place] [address]    Report Status 05/06/2018 FINAL  Final  Culture, blood (Routine X 2) w Reflex to ID Panel     Status: None (Preliminary result)   Collection Time: 05/03/18  7:12 AM  Result Value Ref Range Status   Specimen Description BLOOD BLOOD RIGHT HAND  Final   Special Requests   Final    BOTTLES DRAWN AEROBIC AND ANAEROBIC Blood Culture adequate volume   Culture   Final    NO GROWTH 2 DAYS Performed at [facility], [place] [address], [place], [place] [address]    Report Status PENDING  Incomplete    Studies/Results: No results found.   Assessment/Plan: Assessment: POSEIDON PAM is a 62 y.o. male with a history of HIV (viral load undetectable 02/09/18), hepatitis B, COPD, ESRD (on HD 4 times a week), HTN, CAD who has a 1.5 week history of progressively worsening SOB and LE edema and found to have MRSE bacteremia with evidence of aortic valve endocarditis.  Plan: #Positive blood cultures, non-aureus methicillin resistant staphlycocci (MRSE): - Echo was notable for severe AI and possible isolated left ventricular non-compaction cardiomyopathy. - 8/15 TEE: Aortic valve: The non-coronary cusp is perforated with resultant severe aortic regurgitation. No abscess noted. There is a small vegetation noted on the non and  right coronary cusp. - 8/11 BCID: Methicillin (oxacillin) resistant coagulase negative staphylococcus -  8/11 blood culture: STAPHYLOCOCCUS EPIDERMIDIS - Repeat 8/13 Blood Cx X1: Staph Epidermidis; 8/15 Blood Cx X2 NGTD x 1 day - Remained afebrile throughout admission - Patient had a leukocytosis of 11.3 on admission but has since down trended to WBC 5.6 8/14 - Patient's MRSE bacteremia is likely secondary to his contamination at fistula and subsequent endocarditis. - Day 5 Vanc IV; Should continue for a total of 6 weeks.   #HIV: CD4 count in 01/2018 was undetectable. -Currently on Biktarvy  #SOB: - Likely secondary to fluid overload 2/2 renal disease and endocarditis with acute heart dysfunction. - Continue HD per primary team  Diagnosis: Non-aureus methicillin resistant staphlycocci (MRSE) bacteremia and Endocarditis   Allergies  Allergen Reactions  . Lisinopril Anaphylaxis and Shortness Of Breath    Throat swelling  . Penicillins Anaphylaxis and Other (See Comments)    Childhood allergy Has patient had a PCN reaction causing immediate rash, facial/tongue/throat swelling, SOB or lightheadedness with hypotension: Yes Has patient had a PCN reaction causing severe rash involving mucus membranes or skin necrosis: Unk Has patient had a PCN reaction that required hospitalization: Unk Has patient had a PCN reaction occurring within the last 10 years: No If all of the above answers are "NO", then may proceed with Cephalosporin use.   Marland Kitchen Morphine And Related Other (See Comments)    "Not Himself"    OPAT Orders Discharge antibiotics: Per pharmacy protocol Aim for Vancomycin trough 15-20 (unless otherwise indicated) Duration: 6 weeks End Date: June 13, 2018  [facility] Care Per Protocol:  Labs weekly while on IV antibiotics: _X_ CBC with differential _X_ BMP __ CMP __ CRP __ ESR _X_ Vancomycin trough  _X_ Please pull PIC at completion of IV antibiotics __ Please leave PIC  in place until doctor has seen patient or been notified  Fax weekly labs to [phone]  Clinic Follow Up Appt: 4 weeks [place]         Carroll Sage MD

## 2018-05-06 NOTE — Progress Notes (Addendum)
   Subjective: Mr. Bieler was seen during HD today. He reported feeling well. He denied any SOB, cough, chest pain, fevers, chills or weakness. He noticed his left foot was swollen since last night but denied any pain.   Objective:  Vital signs in last 24 hours: Vitals:   05/06/18 1200 05/06/18 1215 05/06/18 1230 05/06/18 1240  BP: (!) 184/88 (!) 194/93 (!) 198/88 (!) 187/88  Pulse: 75 78 74 74  Resp:    16  Temp:    98.4 F (36.9 C)  TempSrc:    Oral  SpO2:      Weight:    83 kg  Height:       Physical Exam  Constitutional: He is oriented to person, place, and time and well-developed, well-nourished, and in no distress.  Cardiovascular: Normal rate, regular rhythm, normal heart sounds and intact distal pulses.  No murmur heard. Pulmonary/Chest: Effort normal and breath sounds normal. No respiratory distress. He has no wheezes. He has no rales.  Abdominal: Soft. Bowel sounds are normal. He exhibits no distension. There is no tenderness. There is no guarding.  Musculoskeletal: He exhibits edema. He exhibits no tenderness.  Left foot non-pitting edema  Neurological: He is alert and oriented to person, place, and time.    Assessment/Plan:  Principal Problem:   Coagulase negative Staphylococcus bacteremia Active Problems:   ESRD needing dialysis ([place])   Hypervolemia   ESRD on hemodialysis ([facility])   Asymptomatic HIV infection ([facility])   Left ventricular noncompaction cardiomyopathy ([facility])   Acute bacterial endocarditis   Aortic valve endocarditis   Left shoulder pain   Mr. 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   Volume overload in the setting of ESRD on home HD: Left foot non pitting edema. He received HD today.  -continue MWF HD   Positive blood cultures, non-aureus methicillin resistant staphlycocci: Repeat blood cultures from 8/13 and 8/15 showed no growth. ID recommending 6 weeks vancomycin IV with HD for MRSE aortic valve endocarditis and bacteremia. -continue vancomycin 1000 mg IV with HD  -case management on board to coordinate IV antibiotic therapy with home hemodialysis -f/u with cardiology outpatient   Anemia:  Received 2 units of PRBC's last night and Hgb 9.5 today. Hepatic function panel showed decreased total protein, decreased albumin and decreased AST. Stable bilirubin. LDH and haptoglobin labs pending.  -f/u LDH and haptoglobin -f/u with PCP and CT surgery  Shortness of breath: Patient denied any SOB. Stable on room air.  -continue albuterol Q6H prn -continueanoro ellipta tomorrow, 1 puff daily   HTN: 175/76 -changed hydralazine to 50 mg Q8H  -continue carvedilol 25 mg BID  -continue clonidine 0.2 mg transdermal   Dispo: Patient is medically stable for discharge today. Pending on coordinating his IV antibiotic therapy with his home health services.  Rehman, Areeg N, DO 05/06/2018, 1:46 PM Pager: [phone]

## 2018-05-06 NOTE — Progress Notes (Signed)
Internal Medicine Attending  Date: 05/06/2018  Patient name: Jeffrey [name] Medical record number: [id number] Date of birth: [birth year] Age: 62 y.o. Gender: male  I saw and evaluated the patient. I reviewed the resident's note by Dr. Laural Golden and I agree with the resident's findings and plans as documented in her progress note.  When seen on rounds this morning [name] was in hemodialysis. He was without acute complaints. Home IV vancomycin after hemodialysis has been set up and he is stable for discharge today. He will require 6 weeks of IV vancomycin after hemodialysis for his MRSA endocarditis. His LDH was not elevated and his haptoglobin is pending. Given the LDH I doubt that the anemia is related to hemolysis through the perforated aortic valve cusp. Follow-up will be in [facility].

## 2018-05-06 NOTE — Progress Notes (Signed)
Jeffrey [name] to be D/C'd Home per MD order.  Discussed prescriptions and follow up appointments with the patient. Prescriptions given to patient, medication list explained in detail. Pt verbalized understanding.  Allergies as of 05/06/2018      Reactions   Lisinopril Anaphylaxis, Shortness Of Breath   Throat swelling   Penicillins Anaphylaxis, Other (See Comments)   Childhood allergy Has patient had a PCN reaction causing immediate rash, facial/tongue/throat swelling, SOB or lightheadedness with hypotension: Yes Has patient had a PCN reaction causing severe rash involving mucus membranes or skin necrosis: Unk Has patient had a PCN reaction that required hospitalization: Unk Has patient had a PCN reaction occurring within the last 10 years: No If all of the above answers are "NO", then may proceed with Cephalosporin use.   Morphine And Related Other (See Comments)   "Not Himself"      Medication List    TAKE these medications   albuterol (2.5 MG/3ML) 0.083% nebulizer solution Commonly known as:  PROVENTIL Take 2.5 mg by nebulization every 6 (six) hours as needed for wheezing or shortness of breath.   bictegravir-emtricitabine-tenofovir AF 50-200-25 MG Tabs tablet Commonly known as:  BIKTARVY Take 1 tablet by mouth daily.   calcium acetate 667 MG capsule Commonly known as:  PHOSLO Take 667-2,001 mg by mouth See admin instructions. Take 1,334 mg three times a day with each meal and 667 mg with each snack   carvedilol 25 MG tablet Commonly known as:  COREG Take 1 tablet (25 mg total) by mouth 2 (two) times daily with a meal.   cloNIDine 0.2 mg/24hr patch Commonly known as:  CATAPRES - Dosed in mg/24 hr Place 1 patch (0.2 mg total) onto the skin once a week.   clotrimazole-betamethasone cream Commonly known as:  LOTRISONE Apply 1 application topically 2 (two) times daily.   COMBIVENT IN Inhale 2 puffs into the lungs every 6 (six) hours as needed (as needed for shortness of  breath).   epoetin alfa 2000 UNIT/ML injection Commonly known as:  EPOGEN,PROCRIT Inject 2,000 Units into the vein 3 (three) times a week.   ethyl chloride spray Apply 1 application topically daily as needed (HD- to numb).   heparin 1000 UNIT/ML injection Inject 3,000 Units into the vein 4 (four) times a week.   hydrALAZINE 100 MG tablet Commonly known as:  APRESOLINE Take 1 tablet (100 mg total) by mouth 3 (three) times daily.   HYDROcodone-acetaminophen 5-325 MG tablet Commonly known as:  NORCO/VICODIN Take 1 tablet by mouth every 6 (six) hours as needed. What changed:  reasons to take this   iron sucrose 20 MG/ML injection Commonly known as:  VENOFER Inject 100 mg into the vein every Monday.   lacosamide 50 MG Tabs tablet Commonly known as:  VIMPAT Take 1.5 tablets (75 mg total) by mouth 2 (two) times daily. What changed:  Another medication with the same name was added. Make sure you understand how and when to take each.   lacosamide 50 MG Tabs tablet Commonly known as:  VIMPAT Take 0.5 tablets (25 mg total) by mouth every Monday, Wednesday, and Friday at 6 PM. What changed:  You were already taking a medication with the same name, and this prescription was added. Make sure you understand how and when to take each.   megestrol 40 MG tablet Commonly known as:  MEGACE Take 2 tablets (80 mg total) by mouth 2 (two) times daily. What changed:    how much to take  when to take this   multivitamin Tabs tablet Take 1 tablet by mouth daily.   omeprazole 20 MG capsule Commonly known as:  PRILOSEC Take 1 capsule (20 mg total) by mouth daily.   traZODone 50 MG tablet Commonly known as:  DESYREL TAKE 1 TABLET(50 MG) BY MOUTH AT BEDTIME AS NEEDED FOR SLEEP What changed:  See the new instructions.   vancomycin  IVPB Inject 750 mg into the vein every dialysis. Indication: MRSE bacteremia/endocarditis Last Day of Therapy:  06/13/18 Labs - 'Sunday/Monday:  CBC/D, BMP, and  vancomycin trough. Labs - Thursday:  BMP and vancomycin trough Labs - Every other week:  ESR and CRP Please make sure this is administered after dialysis on dialysis days.   VOLTAREN 1 % Gel Generic drug:  diclofenac sodium APPLY 2 GRAMS EXTERNALLY TO THE AFFECTED AREA FOUR TIMES DAILY What changed:    how much to take  how to take this  when to take this  reasons to take this  additional instructions            Home Infusion Instuctions  (From admission, onward)         Start     Ordered   05/06/18 0000  Home infusion instructions [facility] May follow [facility] Dosing Protocol; May administer Cathflo as needed to maintain patency of vascular access device.; Flushing of vascular access device: per [facility] Protocol: 0.9% NaCl pre/post medica...    Question Answer Comment  Instructions May follow [facility] Dosing Protocol   Instructions May administer Cathflo as needed to maintain patency of vascular access device.   Instructions Flushing of vascular access device: per [facility] Protocol: 0.9% NaCl pre/post medication administration and prn patency; Heparin 100 u/ml, 5ml for implanted ports and Heparin 10u/ml, 5ml for all other central venous catheters.   Instructions May follow [facility] Anaphylaxis Protocol for First Dose Administration in the home: 0.9% NaCl at 25-50 ml/hr to maintain IV access for protocol meds. Epinephrine 0.3 ml IV/IM PRN and Benadryl 25-50 IV/IM PRN s/s of anaphylaxis.   Instructions [facility] Infusion Coordinator (RN) to assist per patient IV care needs in the home PRN.      08' /16/19 1511          Vitals:   05/06/18 1240 05/06/18 1542  BP: (!) 187/88 122/83  Pulse: 74 90  Resp: 16 20  Temp: 98.4 F (36.9 C) 98.8 F (37.1 C)  SpO2:  97%    Skin clean, dry and intact without evidence of skin break down, no evidence of skin tears noted. IV catheter discontinued intact. Site without signs and symptoms of complications. Dressing and  pressure applied. Pt denies pain at this time. No complaints noted.  An After Visit Summary was printed and given to the patient. Patient escorted via [place], and D/C home via private auto.  Dixie Dials RN, BSN

## 2018-05-06 NOTE — Care Management Note (Signed)
Case Management Note  Patient Details  Name: Jeffrey [name] MRN: [id number] Date of Birth: 02-Jul-1956  Subjective/Objective:   62 yr old gentleman with hx or ESRD, on Home HD, HTN, HIV, hep B and coronary artery disease, was admitted with progressive shortness of breath and orthopnea.            Action/Plan: Case manager spoke with patient's brother-[name] [phone] concerning patient discharging home on Vancomycin, which will be administered on days he does his treatments. [name] understands plan. CM contacted [facility] and informed them [place] will need to be added to treatment team.order for [facility] RN and resumption of PT will be faxed to [facility].  CM contacted Melene Muller, [facility] Liaison to arrange for Vancomycin. Patient's brother is his care provider at home.     Expected Discharge Date:   pending          Expected Discharge Plan:  [place]  In-House Referral:  NA  Discharge planning Services  CM Consult  Post Acute Care Choice:  Home Health, Resumption of Svcs/PTA Provider Choice offered to:  Patient  DME Arranged:  N/A DME Agency:  NA  HH Arranged:  PT, RN [place] Agency:  [place]  Status of Service:  In process, will continue to follow  If discussed at Long Length of Stay Meetings, dates discussed:    Additional Comments:  Ninfa Meeker, RN 05/06/2018, 2:35 PM

## 2018-05-06 NOTE — Procedures (Signed)
I was present at this session.  I have reviewed the session itself and made appropriate changes.  HD via [facility] AVF.  Unfortunately 2 needles up with in 2 In so recirc, counseled. Machine not set approp corrected.   [name] 8/16/20199:52 AM

## 2018-05-06 NOTE — Progress Notes (Signed)
Patients brother stated that patient only has one pill left of the vicodin and was wanting a refill on the prescription. MD notified. MD stated that he could not write a refill on the prescription at D/C. Patient would have to schedule an appointment with the PCP. Patient and brother notified of this information and understanding.

## 2018-05-06 NOTE — Plan of Care (Signed)
  Problem: Clinical Measurements: Goal: Cardiovascular complication will be avoided Outcome: Progressing   Problem: Clinical Measurements: Goal: Ability to maintain clinical measurements within normal limits will improve Outcome: Progressing

## 2018-05-06 NOTE — Discharge Summary (Addendum)
Name: Jeffrey [name] MRN: [id number] DOB: 11-14-1955 62 y.o. PCP: Velna Ochs, MD  Date of Admission: 05/01/2018  9:03 PM Date of Discharge:  Attending Physician: Oval Linsey, MD  Discharge Diagnosis: 1. ESRD on HD 2. Bacteremia, non-aureus methicillin resistant staphylcocci 3. MRSE aortic valve endocarditis 4. Anemia   Discharge Medications: Allergies as of 05/06/2018      Reactions   Lisinopril Anaphylaxis, Shortness Of Breath   Throat swelling   Penicillins Anaphylaxis, Other (See Comments)   Childhood allergy Has patient had a PCN reaction causing immediate rash, facial/tongue/throat swelling, SOB or lightheadedness with hypotension: Yes Has patient had a PCN reaction causing severe rash involving mucus membranes or skin necrosis: Unk Has patient had a PCN reaction that required hospitalization: Unk Has patient had a PCN reaction occurring within the last 10 years: No If all of the above answers are "NO", then may proceed with Cephalosporin use.   Morphine And Related Other (See Comments)   "Not Himself"      Medication List    TAKE these medications   albuterol (2.5 MG/3ML) 0.083% nebulizer solution Commonly known as:  PROVENTIL Take 2.5 mg by nebulization every 6 (six) hours as needed for wheezing or shortness of breath.   bictegravir-emtricitabine-tenofovir AF 50-200-25 MG Tabs tablet Commonly known as:  BIKTARVY Take 1 tablet by mouth daily.   calcium acetate 667 MG capsule Commonly known as:  PHOSLO Take 667-2,001 mg by mouth See admin instructions. Take 1,334 mg three times a day with each meal and 667 mg with each snack   carvedilol 25 MG tablet Commonly known as:  COREG Take 1 tablet (25 mg total) by mouth 2 (two) times daily with a meal.   cloNIDine 0.2 mg/24hr patch Commonly known as:  CATAPRES - Dosed in mg/24 hr Place 1 patch (0.2 mg total) onto the skin once a week.   clotrimazole-betamethasone cream Commonly known as:  LOTRISONE Apply  1 application topically 2 (two) times daily.   COMBIVENT IN Inhale 2 puffs into the lungs every 6 (six) hours as needed (as needed for shortness of breath).   epoetin alfa 2000 UNIT/ML injection Commonly known as:  EPOGEN,PROCRIT Inject 2,000 Units into the vein 3 (three) times a week.   ethyl chloride spray Apply 1 application topically daily as needed (HD- to numb).   heparin 1000 UNIT/ML injection Inject 3,000 Units into the vein 4 (four) times a week.   hydrALAZINE 100 MG tablet Commonly known as:  APRESOLINE Take 1 tablet (100 mg total) by mouth 3 (three) times daily.   HYDROcodone-acetaminophen 5-325 MG tablet Commonly known as:  NORCO/VICODIN Take 1 tablet by mouth every 6 (six) hours as needed. What changed:  reasons to take this   iron sucrose 20 MG/ML injection Commonly known as:  VENOFER Inject 100 mg into the vein every Monday.   lacosamide 50 MG Tabs tablet Commonly known as:  VIMPAT Take 1.5 tablets (75 mg total) by mouth 2 (two) times daily. What changed:  Another medication with the same name was added. Make sure you understand how and when to take each.   lacosamide 50 MG Tabs tablet Commonly known as:  VIMPAT Take 0.5 tablets (25 mg total) by mouth every Monday, Wednesday, and Friday at 6 PM. What changed:  You were already taking a medication with the same name, and this prescription was added. Make sure you understand how and when to take each.   megestrol 40 MG tablet Commonly known as:  MEGACE Take 2 tablets (80 mg total) by mouth 2 (two) times daily. What changed:    how much to take  when to take this   multivitamin Tabs tablet Take 1 tablet by mouth daily.   omeprazole 20 MG capsule Commonly known as:  PRILOSEC Take 1 capsule (20 mg total) by mouth daily.   traZODone 50 MG tablet Commonly known as:  DESYREL TAKE 1 TABLET(50 MG) BY MOUTH AT BEDTIME AS NEEDED FOR SLEEP What changed:  See the new instructions.   vancomycin  IVPB Inject  750 mg into the vein every dialysis. Indication: MRSE bacteremia/endocarditis Last Day of Therapy:  06/13/18 Labs - 'Sunday/Monday:  CBC/D, BMP, and vancomycin trough. Labs - Thursday:  BMP and vancomycin trough Labs - Every other week:  ESR and CRP Please make sure this is administered after dialysis on dialysis days.   VOLTAREN 1 % Gel Generic drug:  diclofenac sodium APPLY 2 GRAMS EXTERNALLY TO THE AFFECTED AREA FOUR TIMES DAILY What changed:    how much to take  how to take this  when to take this  reasons to take this  additional instructions            Home Infusion Instuctions  (From admission, onward)         Start     Ordered   05/06/18 0000  Home infusion instructions [facility] May follow [facility] Dosing Protocol; May administer Cathflo as needed to maintain patency of vascular access device.; Flushing of vascular access device: per [facility] Protocol: 0.9% NaCl pre/post medica...    Question Answer Comment  Instructions May follow [facility] Dosing Protocol   Instructions May administer Cathflo as needed to maintain patency of vascular access device.   Instructions Flushing of vascular access device: per [facility] Protocol: 0.9% NaCl pre/post medication administration and prn patency; Heparin 100 u/ml, 5ml for implanted ports and Heparin 10u/ml, 5ml for all other central venous catheters.   Instructions May follow [facility] Anaphylaxis Protocol for First Dose Administration in the home: 0.9% NaCl at 25-50 ml/hr to maintain IV access for protocol meds. Epinephrine 0.3 ml IV/IM PRN and Benadryl 25-50 IV/IM PRN s/s of anaphylaxis.   Instructions [facility] Infusion Coordinator (RN) to assist per patient IV care needs in the home PRN.      08' /16/19 1511          Disposition and follow-up:   [name] was discharged from [facility] in Stable condition.  At the hospital follow up visit please address:  1.  ESRD on HD: follow up with  compliance   2. Bacteremia/Endocarditis: follow up with IV antibiotic therapy compliance and CT surgery referral for long term treatment plan for TEE findings  3. Anemia: follow up cbc    4.  Labs / imaging needed at time of follow-up: cbc   5.  Pending labs/ test needing follow-up: LDH, haptoglobin  Follow-up Appointments: Follow-up [facility] Follow up.   Specialty:  Home Health Services Why:  Home Health Physical Therapy- agency will call to arrange initial appointment Contact information: [address] [place] [address] [phone]        Velna Ochs, MD. Go on 05/13/2018.   Specialty:  Internal Medicine Why:  1:15 pm Contact information: [place] [address] [phone]        Campbell Riches, MD .   Specialty:  Infectious Diseases Contact information: [phone]  [address] [place] [address] [phone]        Sherren Mocha, MD .   Specialty:  Cardiology Contact information: [phone] [address] [place] [address] [place] by problem list: 1. ESRD on HD: Patient came in due to increasing dyspnea after non-compliance with home HD. He was requiring 2L [place]. He received HD x3 during his admission with dry weight adjustments and his breathing improved. 2. MRSE Bacteremia/Endocarditis: Patient was found to have multiple positive blood cultures growing non-aureus staphylococcal methicillin-resistant cocci. AV was not well visualized on echo; TEE showed non-coronary cusp perforatation with resultant severe aortic regurgitation. There is a small vegetation noted on the non and right coronary cusp. ID consulted and recommended 6 weeks of IV antibiotics with HD. Patient should follow up with CT surgery to further evaluate MRSE aortic valve endocarditis.  3. Anemia: Patient's hemoglobin on admission was 8.1 and dropped to 6.8 on 8/14. He received 1  unit of blood during HD and repeat CBC showed Hgb of 7.3. He denied any blood in his stool or darkened stool. No signs of active bleeding. He was given 2 units and Hgb improved to 9.5. Concerning for hemolysis due to endocarditis. Haptoglobin and LDH labs pending. 4. Left shoulder pain and left hand swelling: Patient had a fall on his left shoulder ~3 weeks ago. He had ongoing pain near his [facility] joint but denied any abrasions or cuts. Xray was unremarkable. Recommend further evaluation and pain management with pcp. 5. Seizures: His Vimpat was adjusted to renal dosing, compatible with HD. To continue 75 mg BID and take an additional 25 mg after HD.  6. Elevated troponins: Patient had a troponin of 0.22 on admission, trended down to 0.17. EKG was unremarkable. Most likely due to demand ischemia vs volume overload.  Discharge Vitals:   BP (!) 187/88   Pulse 74   Temp 98.4 F (36.9 C) (Oral)   Resp 16   Ht 6' (1.829 m)   Wt 83 kg   SpO2 100%   BMI 24.82 kg/m   Pertinent Labs, Studies, and Procedures:   CBC Latest Ref Rng & Units 05/06/2018 05/06/2018 05/05/2018  WBC 4.0 - 10.5 K/uL 6.9 - 5.6  Hemoglobin 13.0 - 17.0 g/dL 8.9(L) 9.5(L) 7.3(L)  Hematocrit 39.0 - 52.0 % 28.4(L) 29.5(L) 23.3(L)  Platelets 150 - 400 K/uL 164 - 161    CMP Latest Ref Rng & Units 05/06/2018 05/05/2018 05/04/2018  Glucose 70 - 99 mg/dL 92 - 99  BUN 8 - 23 mg/dL 38(H) - 53(H)  Creatinine 0.61 - 1.24 mg/dL 6.24(H) - 8.16(H)  Sodium 135 - 145 mmol/L 137 - 137  Potassium 3.5 - 5.1 mmol/L 3.8 - 4.4  Chloride 98 - 111 mmol/L 100 - 101  CO2 22 - 32 mmol/L 26 - 25  Calcium 8.9 - 10.3 mg/dL 8.1(L) - 8.4(L)  Total Protein 6.5 - 8.1 g/dL - 5.8(L) -  Total Bilirubin 0.3 - 1.2 mg/dL - 0.7 -  Alkaline Phos 38 - 126 U/L - 63 -  AST 15 - 41 U/L - 13(L) -  ALT 0 - 44 U/L - 9 -     Recent Results (from the past 43800 hour(s))  ECHOCARDIOGRAM COMPLETE   Collection Time: 05/03/18 12:14 PM  Result Value   Weight 2,574.97    Height 72   BP 196/90   Narrative                              *  [facility]*                         [address], [place] [address]                            [phone]  ------------------------------------------------------------------- Transthoracic Echocardiography  Patient:    Sehaj, Mcenroe MR #:       [id number] Study Date: 05/03/2018 Gender:     M Age:        36 Height:     182.9 cm Weight:     73 kg BSA:        1.92 m^2 Pt. Status: Room:       5M01C   ADMITTING    [facility]  REFERRING    Karren Cobble  PERFORMING   [facility], Inpatient  REFERRING    [place], [place]  SONOGRAPHER  [facility]  ATTENDING    Maudie Flakes  cc:  ------------------------------------------------------------------- LV EF: 55% -   60%  ------------------------------------------------------------------- Indications:      Bacteremia 790.7.  ------------------------------------------------------------------- Study Conclusions  - Left ventricle: The LV apex is trabeculated and there is a false   tendon in the LV apex. Findings suggestive of non compaction. The   cavity size was mildly dilated. There was severe concentric   hypertrophy. Systolic function was normal. The estimated ejection   fraction was in the range of 55% to 60%. Wall motion was normal;   there were no regional wall motion abnormalities. Features are   consistent with a pseudonormal left ventricular filling pattern,   with concomitant abnormal relaxation and increased filling   pressure (grade 2 diastolic dysfunction). Doppler parameters are   consistent with high ventricular filling pressure. - Aortic valve: There was moderate to severe regurgitation. Valve   area (VTI): 3.43 cm^2. Valve area (Vmean): 3.45 cm^2. - Mitral valve: There was mild regurgitation. - Left atrium: The atrium was  severely dilated. - Right ventricle: The cavity size was mildly dilated. Wall   thickness was normal. - Pulmonary arteries: PA peak pressure: 62 mm Hg (S). - Pericardium, extracardiac: A trivial pericardial effusion was   identified posterior to the heart. There was a left pleural   effusion.  Impressions:  - Normal LVF with severe LVH and mild LVE. There is a left pleural   effusion and trivial pericardial effusion posteriorly. There is   mild MR and trivial PR. The AV is not well visualized but there   appears to be moderate to severe AI of unclear etiology. Suggest   TEE to further evaluated AV. Consider Cardiac MRI to evaluate for   non compaction LV.  ------------------------------------------------------------------- Study data:  Comparison was made to the study of 04/21/2018.  Study status:  Routine.  Procedure:  The patient reported no pain pre or post test. Transthoracic echocardiography. Image quality was good. Study completion:  There were no complications. Transthoracic echocardiography.  M-mode, complete 2D, spectral Doppler, and color Doppler.  Birthdate:  Patient birthdate: 05/12/1956.  Age:  Patient is 62 yr old.  Sex:  Gender: male. BMI: 21.8  kg/m^2.  Blood pressure:     152/73  Patient status: Inpatient.  Study date:  Study date: 05/03/2018. Study time: 11:49 AM.  Location:  Bedside.  -------------------------------------------------------------------  ------------------------------------------------------------------- Left ventricle:  The LV apex is trabeculated and there is a false tendon in the LV apex. Findings suggestive of non compaction. The cavity size was mildly dilated. There was severe concentric hypertrophy. Systolic function was normal. The estimated ejection fraction was in the range of 55% to 60%. Wall motion was normal; there were no regional wall motion abnormalities. Features are consistent with a pseudonormal left ventricular filling  pattern, with concomitant abnormal relaxation and increased filling pressure (grade 2 diastolic dysfunction). Doppler parameters are consistent with high ventricular filling pressure.  ------------------------------------------------------------------- Aortic valve:   Trileaflet; mildly thickened, mildly calcified leaflets. Mobility was not restricted.  Doppler:  Transvalvular velocity was within the normal range. There was no stenosis. There was moderate to severe regurgitation.    VTI ratio of LVOT to aortic valve: 0.9. Valve area (VTI): 3.43 cm^2. Indexed valve area (VTI): 1.78 cm^2/m^2. Mean velocity ratio of LVOT to aortic valve: 0.91. Valve area (Vmean): 3.45 cm^2. Indexed valve area (Vmean): 1.8 cm^2/m^2.    Mean gradient (S): 7 mm Hg.  ------------------------------------------------------------------- Aorta:  Aortic root: The aortic root was normal in size.  ------------------------------------------------------------------- Mitral valve:   Structurally normal valve.   Mobility was not restricted.  Doppler:  Transvalvular velocity was within the normal range. There was no evidence for stenosis. There was mild regurgitation.    Peak gradient (D): 4 mm Hg.  ------------------------------------------------------------------- Left atrium:  The atrium was severely dilated.  ------------------------------------------------------------------- Right ventricle:  The cavity size was mildly dilated. Wall thickness was normal. Systolic function was normal.  ------------------------------------------------------------------- Pulmonic valve:    Structurally normal valve.   Cusp separation was normal.  Doppler:  Transvalvular velocity was within the normal range. There was no evidence for stenosis. There was trivial regurgitation.  ------------------------------------------------------------------- Tricuspid valve:   Structurally normal valve.    Doppler: Transvalvular velocity was  within the normal range. There was no regurgitation.  ------------------------------------------------------------------- Pulmonary artery:   The main pulmonary artery was normal-sized. Systolic pressure was within the normal range.  ------------------------------------------------------------------- Right atrium:  The atrium was normal in size.  ------------------------------------------------------------------- Pericardium:  A trivial pericardial effusion was identified posterior to the heart.  ------------------------------------------------------------------- Systemic veins: Inferior vena cava: The vessel was normal in size.  ------------------------------------------------------------------- Pleura:  There was a left pleural effusion.  ------------------------------------------------------------------- Measurements   Left ventricle                            Value          Reference  LV ID, ED, PLAX chordal           (H)     55.7  mm       43 - 52  LV ID, ES, PLAX chordal                   35.2  mm       23 - 38  LV fx shortening, PLAX chordal            37    %        >=29  LV PW thickness, ED                       16.2  mm       ---------  IVS/LV PW ratio, ED                       0.98           <=1.3  Stroke volume, 2D                         128   ml       ---------  Stroke volume/bsa, 2D                     67    ml/m^2   ---------  LV e&', lateral                            6.96  cm/s     ---------  LV E/e&', lateral                          13.74          ---------  LV s&', lateral                            8.59  cm/s     ---------  LV e&', medial                             3.15  cm/s     ---------  LV E/e&', medial                           30.35          ---------  LV e&', average                            5.06  cm/s     ---------  LV E/e&', average                          18.91          ---------    Ventricular septum                        Value           Reference  IVS thickness, ED                         15.9  mm       ---------    LVOT                                      Value          Reference  LVOT ID, S                                22    mm       ---------  LVOT area                                 3.8  cm^2     ---------  LVOT mean velocity, S                     110   cm/s     ---------  LVOT VTI, S                               33.8  cm       ---------    Aortic valve                              Value          Reference  Aortic valve mean velocity, S             121   cm/s     ---------  Aortic valve VTI, S                       37.4  cm       ---------  Aortic mean gradient, S                   7     mm Hg    ---------  VTI ratio, LVOT/AV                        0.9            ---------  Aortic valve area, VTI                    3.43  cm^2     ---------  Aortic valve area/bsa, VTI                1.78  cm^2/m^2 ---------  Velocity ratio, mean, LVOT/AV             0.91           ---------  Aortic valve area, mean velocity          3.45  cm^2     ---------  Aortic valve area/bsa, mean               1.8   cm^2/m^2 ---------  velocity  Aortic regurg pressure half-time          318   ms       ---------    Aorta                                     Value          Reference  Aortic root ID, ED                        34    mm       ---------    Left atrium                               Value          Reference  LA ID, A-P, ES                            38    mm       ---------  LA ID/bsa, A-P                            1.98  cm/m^2   <=2.2  LA volume, S                              155   ml       ---------  LA volume/bsa, S                          80.6  ml/m^2   ---------  LA volume, ES, 1-p A4C                    136   ml       ---------  LA volume/bsa, ES, 1-p A4C                70.7  ml/m^2   ---------  LA volume, ES, 1-p A2C                    165   ml       ---------  LA volume/bsa, ES, 1-p A2C                85.8  ml/m^2    ---------    Mitral valve                              Value          Reference  Mitral E-wave peak velocity               95.6  cm/s     ---------  Mitral A-wave peak velocity               88.8  cm/s     ---------  Mitral deceleration time                  187   ms       150 - 230  Mitral peak gradient, D                   4     mm Hg    ---------  Mitral E/A ratio, peak                    1.1            ---------    Pulmonary veins                           Value          Reference  Pulmonary vein peak velocity, S           65    cm/s     ---------  Pulmonary vein peak velocity, D           89    cm/s     ---------  Pulmonary vein velocity ratio,            0.7            ---------  peak, S/D    Pulmonary arteries  Value          Reference  PA pressure, S, DP                (H)     62    mm Hg    <=30    Tricuspid valve                           Value          Reference  Tricuspid regurg peak velocity            369   cm/s     ---------  Tricuspid peak RV-RA gradient             54    mm Hg    ---------    Systemic veins                            Value          Reference  Estimated CVP                             8     mm Hg    ---------    Right ventricle                           Value          Reference  RV ID, minor axis, ED, A4C base           43.5  mm       ---------  RV ID, minor axis, ED, A4C mid            32.1  mm       ---------  RV ID, major axis, ED, A4C        (H)     94.2  mm       55 - 91  TAPSE                                     20.9  mm       ---------  RV pressure, S, DP                (H)     62    mm Hg    <=30  RV s&', lateral, S                         12.6  cm/s     ---------    Pulmonic valve                            Value          Reference  Pulmonic valve peak velocity, S           87.6  cm/s     ---------  Legend: (L)  and  (H)  mark values outside specified reference  range.  ------------------------------------------------------------------- Prepared and Electronically Authenticated by  Fransico Him, MD 2019-08-13T12:30:32   *Note: Due to a large number of results and/or encounters for the requested time period, some results have not been displayed. A complete set of results can be found  in Results Review.    Dg Chest 2 View  Result Date: 05/02/2018 CLINICAL DATA:  Shortness of breath EXAM: CHEST - 2 VIEW COMPARISON:  05/01/2018 FINDINGS: Cardiac shadow is stable. Persistent infiltrate and effusion in the left base is seen stable from the recent exam. The right lung is clear. No bony abnormality is seen. IMPRESSION: Left basilar infiltrate with associated effusion. Electronically Signed   By: Inez Catalina M.D.   On: 05/02/2018 02:01   Dg Chest Port 1 View  Result Date: 05/01/2018 CLINICAL DATA:  Shortness of breath and left shoulder pain for 3 weeks. Symptoms worsening. EXAM: PORTABLE CHEST 1 VIEW COMPARISON:  04/17/2018 FINDINGS: Borderline heart size and pulmonary vascularity probably normal for technique and shallow inspiration. There is increased density consistent with consolidation in the left lung base with blunting of the left costophrenic angle suggesting a small effusion. These changes likely indicate left lower lobe pneumonia. Right lung is clear. Mediastinal contours appear intact. No pneumothorax. Degenerative changes in the shoulders. IMPRESSION: Infiltration and effusion in the left lung base likely representing pneumonia. Electronically Signed   By: Lucienne Capers M.D.   On: 05/01/2018 21:46   Dg Shoulder Left  Result Date: 05/02/2018 CLINICAL DATA:  Recent fall with left shoulder pain, initial encounter EXAM: LEFT SHOULDER - 2+ VIEW COMPARISON:  None. FINDINGS: Degenerative changes of the glenohumeral joint are seen. No acute fracture or dislocation is noted. Underlying bony thorax is within normal limits. IMPRESSION: Degenerative changes  without acute abnormality. Electronically Signed   By: Inez Catalina M.D.   On: 05/02/2018 02:01   Discharge Instructions: Discharge Instructions    AMB Referral to [facility]   Complete by:  As directed    Please assign to [facility] due to unplanned readmission risk score of 58% (extreme) and multiple hospitalizations. Written consent obtained. [name] (brother) [phone] to be contacted post discharge. PCP office [facility]) listed as doing toc. Currently at [facility]. Please call with questions. Thanks. [name], [place], RN,BSN-THN [facility] Liaison-[phone]   Reason for consult:  Please assign to [facility] RNCM   Diagnoses of:  Kidney Failure   Expected date of contact:  1-3 days (reserved for hospital discharges)   Call MD for:  difficulty breathing, headache or visual disturbances   Complete by:  As directed    Call MD for:  extreme fatigue   Complete by:  As directed    Call MD for:  hives   Complete by:  As directed    Call MD for:  persistant dizziness or light-headedness   Complete by:  As directed    Call MD for:  persistant nausea and vomiting   Complete by:  As directed    Call MD for:  redness, tenderness, or signs of infection (pain, swelling, redness, odor or green/yellow discharge around incision site)   Complete by:  As directed    Call MD for:  severe uncontrolled pain   Complete by:  As directed    Call MD for:  temperature >100.4   Complete by:  As directed    Diet - low sodium heart healthy   Complete by:  As directed    Discharge instructions   Complete by:  As directed    Mr. Mazzie,  Please see the changes made to your medications listed below.  CHANGES TO MEDICATIONS: -Take 1.5 tablets (75 mg total) Vimpat BID AND an additiona 0.5 tablets (25 mg) every Monday, Wednesday  and Friday after Hemodialysis.  -Continue taking vancomycin 1000 mg IV with hemodialysis 3x a week.  Continue all of your  other medications as prescribed. Make sure to follow up with your PCP on 05/13/18 at 1:15 pm   Thank you for allowing [name] to be a part of your care!   Home infusion instructions [facility] May follow [place] Dosing Protocol; May administer Cathflo as needed to maintain patency of vascular access device.; Flushing of vascular access device: per [facility] Protocol: 0.9% NaCl pre/post medica...   Complete by:  As directed    Instructions:  May follow [place] Dosing Protocol   Instructions:  May administer Cathflo as needed to maintain patency of vascular access device.   Instructions:  Flushing of vascular access device: per [facility] Protocol: 0.9% NaCl pre/post medication administration and prn patency; Heparin 100 u/ml, 56m for implanted ports and Heparin 10u/ml, 542mfor all other central venous catheters.   Instructions:  May follow [facility] Anaphylaxis Protocol for First Dose Administration in the home: 0.9% NaCl at 25-50 ml/hr to maintain IV access for protocol meds. Epinephrine 0.3 ml IV/IM PRN and Benadryl 25-50 IV/IM PRN s/s of anaphylaxis.   Instructions:  [facility] Coordinator (RN) to assist per patient IV care needs in the home PRN.   Increase activity slowly   Complete by:  As directed       Signed: Zyad Boomer, ArCharlsie QuestDO 05/06/2018, 3:31 PM   Pager: [phone]

## 2018-05-06 NOTE — Procedures (Signed)
Upon assessment of patient pt refused target goal of 4 kg goal set 3.5kg

## 2018-05-06 NOTE — Procedures (Signed)
Goal increased to 4 kg per MD upon rounds

## 2018-05-07 LAB — BPAM RBC
BLOOD PRODUCT EXPIRATION DATE: 201908202359
BLOOD PRODUCT EXPIRATION DATE: 201909082359
Blood Product Expiration Date: 201909082359
ISSUE DATE / TIME: 201908151803
ISSUE DATE / TIME: 201908160002
ISSUE DATE / TIME: 201908141601
UNIT TYPE AND RH: 9500
UNIT TYPE AND RH: 9500
Unit Type and Rh: 9500

## 2018-05-07 LAB — TYPE AND SCREEN
ABO/RH(D): O NEG
Antibody Screen: NEGATIVE
UNIT DIVISION: 0
UNIT DIVISION: 0
Unit division: 0

## 2018-05-07 LAB — HAPTOGLOBIN: Haptoglobin: 180 mg/dL (ref 34–200)

## 2018-05-08 ENCOUNTER — Encounter (HOSPITAL_COMMUNITY): Payer: Self-pay | Admitting: Cardiology

## 2018-05-08 ENCOUNTER — Ambulatory Visit (HOSPITAL_BASED_OUTPATIENT_CLINIC_OR_DEPARTMENT_OTHER): Payer: Medicare Other | Attending: Internal Medicine | Admitting: Internal Medicine

## 2018-05-08 VITALS — Ht 72.0 in | Wt 174.0 lb

## 2018-05-08 DIAGNOSIS — G4733 Obstructive sleep apnea (adult) (pediatric): Secondary | ICD-10-CM | POA: Diagnosis not present

## 2018-05-08 DIAGNOSIS — R0683 Snoring: Secondary | ICD-10-CM | POA: Insufficient documentation

## 2018-05-08 LAB — CULTURE, BLOOD (ROUTINE X 2)
CULTURE: NO GROWTH
SPECIAL REQUESTS: ADEQUATE

## 2018-05-08 LAB — HIV-1 RNA QUANT-NO REFLEX-BLD: LOG10 HIV-1 RNA: UNDETERMINED log10copy/mL

## 2018-05-09 ENCOUNTER — Other Ambulatory Visit: Payer: Self-pay

## 2018-05-09 ENCOUNTER — Ambulatory Visit (INDEPENDENT_AMBULATORY_CARE_PROVIDER_SITE_OTHER): Payer: Medicare Other | Admitting: Internal Medicine

## 2018-05-09 ENCOUNTER — Encounter: Payer: Self-pay | Admitting: Internal Medicine

## 2018-05-09 ENCOUNTER — Telehealth: Payer: Self-pay | Admitting: Internal Medicine

## 2018-05-09 ENCOUNTER — Telehealth: Payer: Self-pay | Admitting: *Deleted

## 2018-05-09 ENCOUNTER — Other Ambulatory Visit: Payer: Self-pay | Admitting: Internal Medicine

## 2018-05-09 VITALS — BP 113/82 | HR 72 | Temp 98.8°F | Ht 72.0 in | Wt 184.2 lb

## 2018-05-09 DIAGNOSIS — I351 Nonrheumatic aortic (valve) insufficiency: Secondary | ICD-10-CM

## 2018-05-09 DIAGNOSIS — I120 Hypertensive chronic kidney disease with stage 5 chronic kidney disease or end stage renal disease: Secondary | ICD-10-CM | POA: Diagnosis not present

## 2018-05-09 DIAGNOSIS — I330 Acute and subacute infective endocarditis: Secondary | ICD-10-CM | POA: Diagnosis not present

## 2018-05-09 DIAGNOSIS — B351 Tinea unguium: Secondary | ICD-10-CM | POA: Diagnosis not present

## 2018-05-09 DIAGNOSIS — Z87891 Personal history of nicotine dependence: Secondary | ICD-10-CM

## 2018-05-09 DIAGNOSIS — I358 Other nonrheumatic aortic valve disorders: Secondary | ICD-10-CM

## 2018-05-09 DIAGNOSIS — R569 Unspecified convulsions: Secondary | ICD-10-CM

## 2018-05-09 DIAGNOSIS — G40909 Epilepsy, unspecified, not intractable, without status epilepticus: Secondary | ICD-10-CM | POA: Diagnosis not present

## 2018-05-09 DIAGNOSIS — R2689 Other abnormalities of gait and mobility: Secondary | ICD-10-CM | POA: Diagnosis not present

## 2018-05-09 DIAGNOSIS — Z992 Dependence on renal dialysis: Secondary | ICD-10-CM

## 2018-05-09 DIAGNOSIS — M6281 Muscle weakness (generalized): Secondary | ICD-10-CM | POA: Diagnosis not present

## 2018-05-09 DIAGNOSIS — B957 Other staphylococcus as the cause of diseases classified elsewhere: Secondary | ICD-10-CM

## 2018-05-09 DIAGNOSIS — N186 End stage renal disease: Secondary | ICD-10-CM | POA: Diagnosis not present

## 2018-05-09 DIAGNOSIS — M25512 Pain in left shoulder: Secondary | ICD-10-CM | POA: Diagnosis not present

## 2018-05-09 DIAGNOSIS — D631 Anemia in chronic kidney disease: Secondary | ICD-10-CM | POA: Diagnosis not present

## 2018-05-09 DIAGNOSIS — I214 Non-ST elevation (NSTEMI) myocardial infarction: Secondary | ICD-10-CM | POA: Diagnosis not present

## 2018-05-09 DIAGNOSIS — Z0000 Encounter for general adult medical examination without abnormal findings: Secondary | ICD-10-CM

## 2018-05-09 DIAGNOSIS — M25462 Effusion, left knee: Secondary | ICD-10-CM

## 2018-05-09 LAB — T-HELPER CELLS (CD4) COUNT (NOT AT ARMC)
CD4 T CELL HELPER: 31 % — AB (ref 33–55)
CD4 T Cell Abs: 160 /uL — ABNORMAL LOW (ref 400–2700)

## 2018-05-09 MED ORDER — LACOSAMIDE 50 MG PO TABS: 75.0000 mg | ORAL_TABLET | Freq: Two times a day (BID) | ORAL | 2 refills | Status: DC

## 2018-05-09 MED ORDER — LACOSAMIDE 50 MG PO TABS
50.0000 mg | ORAL_TABLET | ORAL | Status: DC
Start: 2018-05-09 — End: 2018-05-30

## 2018-05-09 MED ORDER — VOLTAREN 1 % TD GEL: TRANSDERMAL | 1 refills | Status: DC

## 2018-05-09 MED ORDER — LACOSAMIDE 50 MG PO TABS: 75.0000 mg | ORAL_TABLET | Freq: Two times a day (BID) | ORAL | 5 refills | Status: DC

## 2018-05-09 MED ORDER — LACOSAMIDE 50 MG PO TABS: ORAL_TABLET | ORAL | 5 refills | Status: DC

## 2018-05-09 MED ORDER — HYDROCODONE-ACETAMINOPHEN 5-325 MG PO TABS: 1.0000 | ORAL_TABLET | Freq: Four times a day (QID) | ORAL | 0 refills | Status: DC | PRN

## 2018-05-09 NOTE — Progress Notes (Signed)
Internal Medicine Clinic Attending  I saw and evaluated the patient.  I personally confirmed the key portions of the history and exam documented by Dr. Agyei and I reviewed pertinent patient test results.  The assessment, diagnosis, and plan were formulated together and I agree with the documentation in the resident's note.  

## 2018-05-09 NOTE — Telephone Encounter (Signed)
Pharmacy needs a e-script for Vimpat 50mg  tablet, pt bring in a hard copy that wasn't sign per Dr Laural Golden. Pharmacy Contact# [phone]

## 2018-05-09 NOTE — Assessment & Plan Note (Signed)
Onychomycosis of left great toe: Patient given referral to podiatry for further evaluation

## 2018-05-09 NOTE — Patient Outreach (Addendum)
[facility]) Care Management  05/09/2018  TRAVONTE BYARD 1956/06/03 [id number]  62 year old with recent admission 8/11-8/16 with ESRD/HD, Bacteremia, MRSE aortic valve. Primary care office listed as completing transition of care.  History of ESRD/HD on home dialysis, aortic valve insufficiency, COPD, HTN, hepatitis B, HIV, fall.   RNCM called to follow up. RNCM spoke with client who reports that he is doing "pretty good". He reports he is not able to talk with RNCM at this time. He is on the way out the door and states he will call RNCM back.  Plan: await return call and follow up in 3-4 days if no return call.  [name], RN, MSN, [place] Coordinator Cell: [phone]

## 2018-05-09 NOTE — Assessment & Plan Note (Signed)
MRSE aortic valve endocarditis: [name] was recently admitted from 8/11 to 8/16 for worsening SOB and LE extremity edema.  He is history of ESRD on HD.  He was subsequently noted to have non-aureus staphylococcal methicillin resistant cocci bacteremia . TEE showed severe aortic insufficiency with small vegetations.  He was started on IV vancomycin and discharged to complete a total of 6 weeks.  He has [facility] health with [facility] and his brother who accompanied him to the clinic today stated he would follow-up with home health.  Today, he reports he is doing well denies fevers, chills, diaphoreses.  -Continue IV vancomycin for a total of 6 weeks (last day 06/13/2018) -CBC today to follow-up leukocytosis -Follow-up with cardiothoracic surgery

## 2018-05-09 NOTE — Patient Instructions (Signed)
Jeffrey [name],  Was a pleasure taking care of you here at the clinic today and I am glad to hear that you doing a little better.  Here are my recommendations after today's visit:  1.  I am giving you a referral to sports medicine for further evaluation of your left shoulder pain 2.  I have given you a refill of hydrocodone and Voltaren gel. 3.  I have given your referral for Vimpat 4.  I am getting some blood work today 5.  Please follow-up with the cardiothoracic surgeon.  ~Dr. Eileen Stanford

## 2018-05-09 NOTE — Progress Notes (Signed)
CC: Hospital follow-up  HPI:  [name] is a 62 y.o. African-American gentleman with medical history as listed below here for hospital follow-up.   He was recently admitted to the hospital from 8/11 to 8/16 for worsening shortness of breath and lower extremity edema secondary to fluid overload per his history of ESRD on hemodialysis.  During his hospitalization, he was found to have non-aureus staphylococcal methicillin resistant cocci bacteremia and aortic valve endocarditis.  He is to have IV vancomycin with hemodialysis for 6 weeks.  MRSE aortic valve endocarditis: [name] was recently admitted from 8/11 to 8/16 for worsening SOB and LE extremity edema.  He is history of ESRD on HD.  He was subsequently noted to have non-aureus staphylococcal methicillin resistant cocci bacteremia . TEE showed severe aortic insufficiency with small vegetations.  He was started on IV vancomycin and discharged to complete a total of 6 weeks.  [facility] and his brother who accompanied him to the clinic today stated he would follow-up with home health.  Today, he reports he is doing well denies fevers, chills, diaphoreses.  -Continue IV vancomycin for a total of 6 weeks (last day 06/13/2018) -CBC today to follow-up leukocytosis -Follow-up with cardiothoracic surgery   Left shoulder pain: He reports of a 3-week history of left shoulder pain after a fall.  X-ray of the shoulder showed degenerative changes without acute abnormality.  He was initially given 10 doses of hydrocodone-acetaminophen 5-325 mg and states it provided relief.  Today he continues to complain of an 8/10 achy left shoulder pain that is worse with movement and touch.  On physical exam, left shoulder was tender to palpation and ABduction.  Patient could not ABduct shoulder above 30 degrees. -Referral to sports medicine -Given short course of hydrocodone-acetaminophen 5- 325 mg -Given a refill of Voltaren  gel   ESRD on HD: He presented to the hospital on 8/11 with worsening shortness of breath.  During his admission he received HD x3 dry weight adjustments.  Per his brother, his dialysis output goal is 3 L.  However they have been able to only put out 2.5 L because the dialysis machine will automatically stop.  During this week, he would have daily hemodialysis with goal of 2 L of output each day.  His brother will contact [facility] for further evaluation of dialysis machine.   Onychomycosis of left great toe: Patient given referral to podiatry for further evaluation  Seizure disorder: Patient requested refill of Vimpat   Past Medical History:  Diagnosis Date  . Anemia   . Anxiety   . Arthritis   . Asthma    per pt hx  . End stage renal disease on home HD 07/10/2011   Started HD in September 2012 at [facility] with a tunneled HD catheter, now on home HD with NxtStage. Dialyzing through AVF L lower arm with buttonhole technique as of mid 2014. His brother does the HD treatments at home.  They are roommates for 23 years.  The brother works 3rd shift and gets off about 8am and then puts Mr Jeffrey [name] on HD in the morning after getting home. Most of the time he does HD about 4 times a week, for about 4 hours per treatment. Cause of ESRD was HTN according to patient. He says he let his health go and ending up with complications, and that he didn't like seeing doctors in those days.  He says he was diagnosed with severe  HTN when he lived in [place] in his 81's.   . Hepatitis B carrier ([place])   . HIV infection ([place])   . Hypertension   . Hyperthyroidism    normal now  . Pneumonia several yrs ago  . Seizure ([place])   . Seizures ([place]) 06/02/2011   x 1 none since  . Thrombocytopenia ([place])    Review of Systems: As per HPI  Physical Exam:  Vitals:   05/09/18 0847  BP: 113/82  Pulse: 72  Temp: 98.8 F (37.1 C)  TempSrc: Oral  SpO2: 99%  Weight: 184 lb 3.2 oz (83.6 kg)  Height: 6' (1.829 m)    Constitutional: In mild distress secondary to left shoulder pain Cardiovascular: Normal S1 and S2.  No murmurs, gallops, rubs Respiratory: Mild crackles appreciated at the lower lobes Extremity: Trace bilateral pitting edema  Assessment & Plan:   See Encounters Tab for problem based charting.  Patient discussed with Dr. Lynnae January

## 2018-05-09 NOTE — Assessment & Plan Note (Signed)
Left shoulder pain: He reports of a 3-week history of left shoulder pain after a fall.  X-ray of the shoulder showed degenerative changes without acute abnormality.  He was initially given 10 doses of hydrocodone-acetaminophen 5-325 mg and states it provided relief.  Today he continues to complain of an 8/10 achy left shoulder pain that is worse with movement and touch.  On physical exam, left shoulder was tender to palpation and ABduction.  Patient could not ABduct shoulder above 30 degrees. -Referral to sports medicine -Given short course of hydrocodone-acetaminophen 5- 325 mg -Given a refill of Voltaren gel

## 2018-05-09 NOTE — Telephone Encounter (Signed)
Pt's brother calls and states "we have still not heard from [facility] or anyone else about [facility] medicine infusion" Called [facility] at [phone], spoke w/ [place] and Engineer, civil (consulting), Engineer, civil (consulting) being Dealer. They have NOT received any orders for infusion. Called emily sinclair rph ID clinical pharm, she states the treatment team should have sent the orders to [facility]. She is calling [facility] to see if she can give verbal orders, will speak to dr Software engineer.

## 2018-05-09 NOTE — Assessment & Plan Note (Signed)
ESRD on HD: He presented to the hospital on 8/11 with worsening shortness of breath.  During his admission he received HD x3 dry weight adjustments.  Per his brother, his dialysis output goal is 3 L.  However they have been able to only put out 2.5 L because the dialysis machine will automatically stop.  During this week, he would have daily hemodialysis with goal of 2 L of output each day.  His brother will contact [facility] for further evaluation of dialysis machine.

## 2018-05-09 NOTE — Telephone Encounter (Signed)
Please review and send 1 script for vimpat to [facility], triage cannot determine from notes.

## 2018-05-10 ENCOUNTER — Telehealth: Payer: Self-pay

## 2018-05-10 ENCOUNTER — Other Ambulatory Visit: Payer: Self-pay | Admitting: Internal Medicine

## 2018-05-10 DIAGNOSIS — G4733 Obstructive sleep apnea (adult) (pediatric): Secondary | ICD-10-CM

## 2018-05-10 LAB — CBC
HEMATOCRIT: 29.8 % — AB (ref 37.5–51.0)
Hemoglobin: 9.7 g/dL — ABNORMAL LOW (ref 13.0–17.7)
MCH: 29.5 pg (ref 26.6–33.0)
MCHC: 32.6 g/dL (ref 31.5–35.7)
MCV: 91 fL (ref 79–97)
Platelets: 180 10*3/uL (ref 150–450)
RBC: 3.29 x10E6/uL — AB (ref 4.14–5.80)
RDW: 16.9 % — ABNORMAL HIGH (ref 12.3–15.4)
WBC: 6.5 10*3/uL (ref 3.4–10.8)

## 2018-05-10 LAB — CULTURE, BLOOD (ROUTINE X 2)
CULTURE: NO GROWTH
Culture: NO GROWTH
SPECIAL REQUESTS: ADEQUATE
Special Requests: ADEQUATE

## 2018-05-10 NOTE — Telephone Encounter (Signed)
Requesting sleep study result. Please call back.

## 2018-05-10 NOTE — Progress Notes (Unsigned)
Case manager spoke with ED Case manager concerning call received from patient's brother concerning IV Vanc. Case manager contacted Carolynn Sayers, [place] [facility] Infusion Specialist to investigate. Per Pam, Vancomycin was delivered to patient's home on Monday evening 05/09/18,  to be administered at end of patient's dialysis treatment, which was going to be [place] did send nurse out to educate patient's brother.

## 2018-05-10 NOTE — Telephone Encounter (Signed)
Estill Bamberg with [facility] is calling for skill nursing orders on patient.  When asked what skills she will be performing she stated IV medication education. I then asked who would be drawing the labs and she stated the patient's brother draws the labs from the fistula.   I shared this information with Dr Johnnye Sima who has requested the RN perform the labs and care related to the IV antibiotics.  The patient's brother should not perform the lab draw.   Nurse informed verbal ok for interventions related to IV antibiotics to include lab draws.    Estill Bamberg:  [place].   Laverle Patter, RN

## 2018-05-10 NOTE — Telephone Encounter (Signed)
Results are not back yet.  

## 2018-05-11 ENCOUNTER — Encounter: Payer: Self-pay | Admitting: Internal Medicine

## 2018-05-11 ENCOUNTER — Ambulatory Visit (INDEPENDENT_AMBULATORY_CARE_PROVIDER_SITE_OTHER): Payer: Medicare Other | Admitting: Sports Medicine

## 2018-05-11 ENCOUNTER — Encounter: Payer: Self-pay | Admitting: Infectious Diseases

## 2018-05-11 ENCOUNTER — Telehealth: Payer: Self-pay | Admitting: Internal Medicine

## 2018-05-11 ENCOUNTER — Ambulatory Visit (INDEPENDENT_AMBULATORY_CARE_PROVIDER_SITE_OTHER): Payer: Medicare Other | Admitting: Infectious Diseases

## 2018-05-11 VITALS — BP 200/73 | Ht 72.0 in | Wt 185.0 lb

## 2018-05-11 DIAGNOSIS — B957 Other staphylococcus as the cause of diseases classified elsewhere: Secondary | ICD-10-CM | POA: Diagnosis not present

## 2018-05-11 DIAGNOSIS — I330 Acute and subacute infective endocarditis: Secondary | ICD-10-CM | POA: Diagnosis not present

## 2018-05-11 DIAGNOSIS — M25512 Pain in left shoulder: Secondary | ICD-10-CM | POA: Diagnosis not present

## 2018-05-11 DIAGNOSIS — B20 Human immunodeficiency virus [HIV] disease: Secondary | ICD-10-CM | POA: Diagnosis not present

## 2018-05-11 DIAGNOSIS — I351 Nonrheumatic aortic (valve) insufficiency: Secondary | ICD-10-CM

## 2018-05-11 DIAGNOSIS — I2510 Atherosclerotic heart disease of native coronary artery without angina pectoris: Secondary | ICD-10-CM | POA: Diagnosis not present

## 2018-05-11 DIAGNOSIS — N186 End stage renal disease: Secondary | ICD-10-CM

## 2018-05-11 DIAGNOSIS — M67912 Unspecified disorder of synovium and tendon, left shoulder: Secondary | ICD-10-CM

## 2018-05-11 DIAGNOSIS — I10 Essential (primary) hypertension: Secondary | ICD-10-CM | POA: Diagnosis not present

## 2018-05-11 NOTE — Assessment & Plan Note (Signed)
Uncontrolled, will try to get him seen by [facility]

## 2018-05-11 NOTE — Telephone Encounter (Signed)
Brother notified that he will receive a call once results are in. Hubbard Hartshorn, RN, BSN

## 2018-05-11 NOTE — Progress Notes (Signed)
Mri

## 2018-05-11 NOTE — Assessment & Plan Note (Signed)
Will try to get him in with [facility] Unfortunately he has been d/c by them prior.

## 2018-05-11 NOTE — Progress Notes (Signed)
HPI  CC: Left shoulder pain  Jeffrey [name] is a 62 year old male with history of end-stage renal disease, coronary artery disease, seizure disorder, endocarditis (currently being treated), COPD, hepatitis B, HIV who presents today for left shoulder pain.  He states he had a traumatic fall 4 weeks ago while leaving dialysis.  He states he stepped onto an uneven step and fell backwards landing on his left shoulder.  He states that around 1 day following this traumatic fall, he began to have pain and weakness with overhead activity.  He states is gotten progressively worse for now he cannot go overhead at all.  He also states he cannot internally rotate his shoulder.  X-rays obtained at that time were negative for any fracture.  He states the pain is worse at nighttime.  He is been taking Vicodin, around to half tab every night, with some relief.  He denies any numbness and tingling in the arm.  He denies any radiating pain to other parts of his body.  He describes pain as dull and achy.  He has no prior injury to the shoulder.  Past Surgeries: EGD 2019, TEE 2019, irrigation debridement of his left knee 2018, left and right heart cath 2018, AV fistula placement 2017. Smoking: Former smoker, quit in 2016 Family Hx: Noncontributory  ROS: Per HPI; in addition no fever, no rash, no additional weakness, no additional numbness, no additional paresthesias, and no additional falls/injury.   Objective: BP (!) 200/73   Ht 6' (1.829 m)   Wt 184 lb 15.5 oz (83.9 kg)   BMI 25.09 kg/m  Gen: right-Hand Dominant. NAD, well groomed, a/o x3, normal affect.  Ambulation with wheelchair. CV: Well-perfused. Warm.  Resp: Non-labored.  Neuro: Sensation intact throughout.  Left shoulder exam: No erythema, warmth, swelling noted.  Patient holding his left arm with his right arm.  Tenderness palpation of the anterior shoulder.  Full range of motion and external rotation.  Patient unable to internally rotate his left shoulder  to his midline.  Around 20 degrees of forward flexion, able to get to around 90 degrees of forward flexion passively, with pain.  Patient able to get around 45 degrees of abduction, able to get to around 100 degrees of abduction passively with pain.  4 out of 5 strength in external rotation, 4 out of 5 strength in abduction, unable to assess internal rotation, 4 out of 5 strength in forward flexion.  Unable to assess special test to shoulder, due to pain with range of motion over the 90 degree mark.  Assessment and Plan:  1. Shoulder pain, concern for complete rotator cuff tear. 2. Osteoarthritis of the left shoulder  I discussed with Karin at today's visit that his left shoulder likely has a rotator cuff tear based on physical exam.  This is likely a traumatic tear, given his mechanism 4 weeks ago.  I discussed with both him and his brother that an MRI would be the test of choice to evaluate for complete tear.  However, Sheila has multiple comorbidities that would likely make him not a surgical candidate.  I discussed the pros and cons of obtaining MRI based on this.  Both the patient and his brother agreed they would like to get the MRI today.  We will start him with home physical therapy as well to work on rotator cuff range of motion.  I will follow him up in 4 weeks for reevaluation.  If at that time he still has pain in his shoulder,  we will consider doing a subacromial injection versus an intra-articular injection of the left shoulder (he appears to glenohumeral osteoarthritis left shoulder based on x-rays obtained this month).  Lewanda Rife, MD [place] Fellow 05/11/2018 11:36 AM

## 2018-05-11 NOTE — Progress Notes (Signed)
I made an attempt to call patient and discuss his CBC results but unfortunately was not able to reach him.  I will proceed by sending a letter.  Overall his hemoglobin is stable and very much improved.

## 2018-05-11 NOTE — Assessment & Plan Note (Signed)
Will have him eval by CVTS due to his SOB and DOE.

## 2018-05-11 NOTE — Assessment & Plan Note (Signed)
I am concerned that he may have valve failure.  Will repeat BCx at end of tx.

## 2018-05-11 NOTE — Progress Notes (Signed)
   Subjective:    Patient ID: Jeffrey [name], male    DOB: 27-Jan-1956, 62 y.o.   MRN: [id number]  HPI 62 yo M with HIV, ESRD (home HD) adm 8-12 with progressive SOB and LE edema. He was found to have MRSE 2/2 BCx. He was found on TEE to have Ao perforated valve with vegetation.  He was started on vancomycin. He was d/c home on 8-19.  He has been doing well but has not liked the extra blood draws.  Has been having high BP at home. Has not seen his renal doc since June.  He continues on biktarvy.   HIV 1 RNA Quant (copies/mL)  Date Value  05/06/2018 <20  02/09/2018 <20 NOT DETECTED  11/01/2017 <20 DETECTED (A)   CD4 T Cell Abs (/uL)  Date Value  05/06/2018 160 (L)  11/01/2017 390 (L)  02/10/2017 460    Review of Systems  Constitutional: Negative for appetite change and unexpected weight change.  Respiratory: Positive for shortness of breath.   Cardiovascular: Positive for leg swelling.  Gastrointestinal: Negative for constipation and diarrhea.  Neurological: Negative for headaches.  Psychiatric/Behavioral: Positive for sleep disturbance.  SOB waking him up at night.  Please see HPI. All other systems reviewed and negative.     Objective:   Physical Exam  Constitutional: He is oriented to person, place, and time. He appears well-developed and well-nourished.  HENT:  Mouth/Throat: No oropharyngeal exudate.  Eyes: Pupils are equal, round, and reactive to light. EOM are normal.  Neck: Normal range of motion. Neck supple.  Cardiovascular: Normal rate, regular rhythm and normal heart sounds.  Pulmonary/Chest: Effort normal. He has rhonchi.  Abdominal: Soft. Bowel sounds are normal. He exhibits no distension. There is no tenderness.  Musculoskeletal: He exhibits edema.  Lymphadenopathy:    He has no cervical adenopathy.  Neurological: He is alert and oriented to person, place, and time.  Skin:          Assessment & Plan:

## 2018-05-11 NOTE — Telephone Encounter (Signed)
I checked Dr Rivka Safer inbasket and pt's chart and results not back. Someone will be checking QD and we will call him when back.

## 2018-05-11 NOTE — Patient Instructions (Signed)
Thank you for coming to see [name] today in clinic.  Based on your physical exam at today's visit, we are concerned she may have a tear in the rotator cuff tendons of your left shoulder.  We are going to get she started with home physical therapy for range of motion of the left shoulder.  Based on her discussion today, we will obtain an MRI to evaluate the rotator cuff.  Be advised that if there is a large tear, it is unlikely that surgery will be an option for you based on your medical conditions.  We will see back in 4 weeks for reevaluation.

## 2018-05-11 NOTE — Assessment & Plan Note (Signed)
He had a drop in his CD4 which I suspect due to to his acute illness.  Will repeat his CD4 when he returns in 1 month He has had no problems with his ART.

## 2018-05-11 NOTE — Telephone Encounter (Signed)
Pt brother is calling about checking on results, pls call [phone]

## 2018-05-12 DIAGNOSIS — D631 Anemia in chronic kidney disease: Secondary | ICD-10-CM | POA: Diagnosis not present

## 2018-05-12 DIAGNOSIS — N186 End stage renal disease: Secondary | ICD-10-CM | POA: Diagnosis not present

## 2018-05-12 DIAGNOSIS — Z992 Dependence on renal dialysis: Secondary | ICD-10-CM | POA: Diagnosis not present

## 2018-05-13 ENCOUNTER — Other Ambulatory Visit: Payer: Self-pay

## 2018-05-13 ENCOUNTER — Ambulatory Visit: Payer: Medicare Other

## 2018-05-13 DIAGNOSIS — Z992 Dependence on renal dialysis: Secondary | ICD-10-CM | POA: Diagnosis not present

## 2018-05-13 DIAGNOSIS — Z5189 Encounter for other specified aftercare: Secondary | ICD-10-CM | POA: Diagnosis not present

## 2018-05-13 DIAGNOSIS — N186 End stage renal disease: Secondary | ICD-10-CM | POA: Diagnosis not present

## 2018-05-13 DIAGNOSIS — M6281 Muscle weakness (generalized): Secondary | ICD-10-CM | POA: Diagnosis not present

## 2018-05-13 DIAGNOSIS — I214 Non-ST elevation (NSTEMI) myocardial infarction: Secondary | ICD-10-CM | POA: Diagnosis not present

## 2018-05-13 DIAGNOSIS — D631 Anemia in chronic kidney disease: Secondary | ICD-10-CM | POA: Diagnosis not present

## 2018-05-13 DIAGNOSIS — R2689 Other abnormalities of gait and mobility: Secondary | ICD-10-CM | POA: Diagnosis not present

## 2018-05-13 DIAGNOSIS — I120 Hypertensive chronic kidney disease with stage 5 chronic kidney disease or end stage renal disease: Secondary | ICD-10-CM | POA: Diagnosis not present

## 2018-05-13 NOTE — Patient Outreach (Signed)
[facility]) Care Management  05/13/2018  Jeffrey [name] 1956/06/18 [id number]  62 year old with recent admission 8/11-8/16 with ESRD/HD, Bacteremia, MRSE aortic valve. Primary care office listed as completing transition of care.  History of ESRD/HD on home dialysis, aortic valve insufficiency, COPD, HTN, hepatitis B, HIV, fall.   RNCM called to follow up. Client's brother, [name] answered the phone. He reports that client is doing much better. He states client continues with home dialysis treatments four times/week. He also reports that [facility].   RNCM attempted to schedule home visit, but [name] states he is not home at this time and will call RNCM on Monday to schedule a home visit after he check's client's schedule.  Plan: follow up next week.  [name], RN, MSN, [place] Coordinator Cell: [phone]

## 2018-05-16 ENCOUNTER — Ambulatory Visit
Admission: RE | Admit: 2018-05-16 | Discharge: 2018-05-16 | Disposition: A | Discharge status: Home / Self Care | Payer: Medicare Other | Source: Ambulatory Visit | Attending: Sports Medicine | Admitting: Sports Medicine

## 2018-05-16 ENCOUNTER — Ambulatory Visit (INDEPENDENT_AMBULATORY_CARE_PROVIDER_SITE_OTHER): Payer: Medicare Other | Admitting: Podiatry

## 2018-05-16 ENCOUNTER — Encounter: Payer: Self-pay | Admitting: Podiatry

## 2018-05-16 ENCOUNTER — Telehealth: Payer: Self-pay

## 2018-05-16 VITALS — Resp 16

## 2018-05-16 DIAGNOSIS — M79675 Pain in left toe(s): Secondary | ICD-10-CM

## 2018-05-16 DIAGNOSIS — M79674 Pain in right toe(s): Secondary | ICD-10-CM | POA: Diagnosis not present

## 2018-05-16 DIAGNOSIS — N186 End stage renal disease: Secondary | ICD-10-CM | POA: Diagnosis not present

## 2018-05-16 DIAGNOSIS — M6281 Muscle weakness (generalized): Secondary | ICD-10-CM | POA: Diagnosis not present

## 2018-05-16 DIAGNOSIS — B351 Tinea unguium: Secondary | ICD-10-CM | POA: Diagnosis not present

## 2018-05-16 DIAGNOSIS — I120 Hypertensive chronic kidney disease with stage 5 chronic kidney disease or end stage renal disease: Secondary | ICD-10-CM | POA: Diagnosis not present

## 2018-05-16 DIAGNOSIS — R2689 Other abnormalities of gait and mobility: Secondary | ICD-10-CM | POA: Diagnosis not present

## 2018-05-16 DIAGNOSIS — M25512 Pain in left shoulder: Secondary | ICD-10-CM

## 2018-05-16 DIAGNOSIS — R600 Localized edema: Secondary | ICD-10-CM | POA: Diagnosis not present

## 2018-05-16 DIAGNOSIS — R7881 Bacteremia: Secondary | ICD-10-CM | POA: Diagnosis not present

## 2018-05-16 DIAGNOSIS — Z992 Dependence on renal dialysis: Secondary | ICD-10-CM | POA: Diagnosis not present

## 2018-05-16 DIAGNOSIS — I2510 Atherosclerotic heart disease of native coronary artery without angina pectoris: Secondary | ICD-10-CM

## 2018-05-16 DIAGNOSIS — I214 Non-ST elevation (NSTEMI) myocardial infarction: Secondary | ICD-10-CM | POA: Diagnosis not present

## 2018-05-16 DIAGNOSIS — D631 Anemia in chronic kidney disease: Secondary | ICD-10-CM | POA: Diagnosis not present

## 2018-05-16 NOTE — Telephone Encounter (Signed)
Judeen Hammans, nurse from [place] stated she was unable to draw labs Friday 8/23 and Monday 8/26, 3 sticks each attempt.  She states "his brother drew the labs from the fistula."

## 2018-05-16 NOTE — Telephone Encounter (Signed)
Home Health nurse called to report she was not able to draw patient's labs. She had three unsuccessful sticks. She states the patient's brother drew labs from the fistula both days and she witnessed .  Original request was for home health to manage lab draws.  She wanted to make sure Dr Johnnye Sima was aware.  If there are concerns please call the office at [place], RN

## 2018-05-16 NOTE — Progress Notes (Signed)
   Subjective:    Patient ID: Jeffrey [name], male    DOB: 02/12/1956, 62 y.o.   MRN: [id number]  HPI    Review of Systems  All other systems reviewed and are negative.      Objective:   Physical Exam        Assessment & Plan:

## 2018-05-17 ENCOUNTER — Other Ambulatory Visit: Payer: Self-pay

## 2018-05-17 DIAGNOSIS — D631 Anemia in chronic kidney disease: Secondary | ICD-10-CM | POA: Diagnosis not present

## 2018-05-17 DIAGNOSIS — Z992 Dependence on renal dialysis: Secondary | ICD-10-CM | POA: Diagnosis not present

## 2018-05-17 DIAGNOSIS — N186 End stage renal disease: Secondary | ICD-10-CM | POA: Diagnosis not present

## 2018-05-17 NOTE — Patient Outreach (Signed)
[facility]) Care Management  05/17/2018  JACQUELINE DELAPENA [birth year] [id number]   62 year old with recent admission 8/11-8/16 with ESRD/HD, Bacteremia, MRSE aortic valve. Primary care office listed as completing transition of care.  History of ESRD/HD on home dialysis, aortic valve insufficiency, COPD, HTN, hepatitis B,HIV,fall.   RNCM called to follow up and schedule home visit. RNCM called home number. No answer and HIPPA compliant message left. RNCM called mobile number and client's brother [name] answered. He reports that he is with client and client is doing better.   Plan: home visit scheduled.  [name], RN, MSN, [place] Coordinator Cell: [phone]

## 2018-05-17 NOTE — Progress Notes (Signed)
Subjective:   Patient ID: Jeffrey [name], male   DOB: 62 y.o.   MRN: [id number]   HPI Patient presents with severely elongated nailbeds that he cannot take care of them presents with caregiver.  They are very thickened and painful and impossible for him to cut.  Patient is not smoking currently and does not like to drink and would like to be more active   Review of Systems  All other systems reviewed and are negative.       Objective:  Physical Exam  Constitutional: He appears well-developed and well-nourished.  Cardiovascular: Intact distal pulses.  Pulmonary/Chest: Effort normal.  Musculoskeletal: Normal range of motion.  Neurological: He is alert.  Skin: Skin is warm.  Nursing note and vitals reviewed.   Neurovascular status found to be intact muscle strength adequate range of motion was within normal limits with patient being HIV positive.  Patient has severely thickened nails hallux fifth third bilateral that are dystrophic painful and hard to cut with all nails being affected.  Patient has good digital perfusion well oriented     Assessment:  Severely elongated nailbeds with thickness dystrophic debris and pain     Plan:  Reviewed condition and overall health history and mycotic nail infection.  Today I debrided nailbeds 1-5 both feet with no iatrogenic bleeding and advised on routine care with consideration for permanent procedure one point in future

## 2018-05-18 NOTE — Telephone Encounter (Signed)
Estill Bamberg from [facility] called stating they are unable to peripherally stick [name] for labs.  They have been unsuccessful and patient's "brother" has been drawing labs for him from his fistula.   [facility] they will need a revised order for the "brother" to be able to draw his labs from his fistula.  If this is not possible they will be discharging the patient from [facility] Friday 05/20/2018 in the AM.  Per Estill Bamberg the "brother" has been trained by "the dialysis" center to draw blood because he draws labs monthly and sends in to the dialysis center. Pricilla Riffle RN

## 2018-05-19 ENCOUNTER — Ambulatory Visit: Payer: Self-pay

## 2018-05-19 ENCOUNTER — Other Ambulatory Visit: Payer: Self-pay

## 2018-05-19 DIAGNOSIS — Z992 Dependence on renal dialysis: Secondary | ICD-10-CM | POA: Diagnosis not present

## 2018-05-19 DIAGNOSIS — D631 Anemia in chronic kidney disease: Secondary | ICD-10-CM | POA: Diagnosis not present

## 2018-05-19 DIAGNOSIS — N186 End stage renal disease: Secondary | ICD-10-CM | POA: Diagnosis not present

## 2018-05-19 NOTE — Telephone Encounter (Signed)
[place] for brother to draw labs thanks

## 2018-05-19 NOTE — Patient Outreach (Signed)
[facility]) Care Management  05/19/2018  Jeffrey [name] [birth year] [id number]   Home visit scheduled for today. RNCM called to confirm appointment. Spoke with Jeffrey [name], brother who reports client went to see nephrologist in [place] and they are stuck in traffic.  Jeffrey [name] states he will call next week to reschedule appointment.  Plan: Follow up next week if no return call.   [name], RN, MSN, [place] Coordinator Cell: [phone]

## 2018-05-19 NOTE — Telephone Encounter (Signed)
Order sent to [facility] granting permission for family member [name] to draw labs needed for intravenous antibiotics from fistula due to nursing not being able to find venous access.   Laverle Patter, RN

## 2018-05-20 DIAGNOSIS — R2689 Other abnormalities of gait and mobility: Secondary | ICD-10-CM | POA: Diagnosis not present

## 2018-05-20 DIAGNOSIS — N186 End stage renal disease: Secondary | ICD-10-CM | POA: Diagnosis not present

## 2018-05-20 DIAGNOSIS — R7881 Bacteremia: Secondary | ICD-10-CM | POA: Diagnosis not present

## 2018-05-20 DIAGNOSIS — M6281 Muscle weakness (generalized): Secondary | ICD-10-CM | POA: Diagnosis not present

## 2018-05-20 DIAGNOSIS — I120 Hypertensive chronic kidney disease with stage 5 chronic kidney disease or end stage renal disease: Secondary | ICD-10-CM | POA: Diagnosis not present

## 2018-05-20 DIAGNOSIS — D631 Anemia in chronic kidney disease: Secondary | ICD-10-CM | POA: Diagnosis not present

## 2018-05-20 DIAGNOSIS — I214 Non-ST elevation (NSTEMI) myocardial infarction: Secondary | ICD-10-CM | POA: Diagnosis not present

## 2018-05-20 DIAGNOSIS — Z992 Dependence on renal dialysis: Secondary | ICD-10-CM | POA: Diagnosis not present

## 2018-05-21 DIAGNOSIS — N186 End stage renal disease: Secondary | ICD-10-CM | POA: Diagnosis not present

## 2018-05-21 DIAGNOSIS — Z992 Dependence on renal dialysis: Secondary | ICD-10-CM | POA: Diagnosis not present

## 2018-05-22 HISTORY — PX: EYE SURGERY: SHX253

## 2018-05-23 DIAGNOSIS — N186 End stage renal disease: Secondary | ICD-10-CM | POA: Diagnosis not present

## 2018-05-23 DIAGNOSIS — R0683 Snoring: Secondary | ICD-10-CM | POA: Diagnosis not present

## 2018-05-23 DIAGNOSIS — N2581 Secondary hyperparathyroidism of renal origin: Secondary | ICD-10-CM | POA: Diagnosis not present

## 2018-05-23 DIAGNOSIS — D631 Anemia in chronic kidney disease: Secondary | ICD-10-CM | POA: Diagnosis not present

## 2018-05-23 DIAGNOSIS — Z992 Dependence on renal dialysis: Secondary | ICD-10-CM | POA: Diagnosis not present

## 2018-05-23 DIAGNOSIS — D509 Iron deficiency anemia, unspecified: Secondary | ICD-10-CM | POA: Diagnosis not present

## 2018-05-23 NOTE — Procedures (Signed)
Patient Name: Jeffrey [name], Jeffrey [name] Date: 05/08/2018 Gender: Male D.O.B: 06-06-1956 Age (years): 62 Referring Provider: Gilles Chiquito Height (inches): 72 Interpreting Physician: Baird Lyons MD, ABSM Weight (lbs): 174 RPSGT: Zadie Rhine BMI: 24 MRN: [id number] Neck Size: 14.50  CLINICAL INFORMATION The patient is referred for a split night study with BPAP.  MEDICATIONS Medications self-administered by patient taken the night of the study : COREG, HYDRALAZINE, VIMPAT, [place] As per the AASM Manual for the Scoring of Sleep and Associated Events v2.3 (April 2016) with a hypopnea requiring 4% desaturations.  The channels recorded and monitored were frontal, central and occipital EEG, electrooculogram (EOG), submentalis EMG (chin), nasal and oral airflow, thoracic and abdominal wall motion, anterior tibialis EMG, snore microphone, electrocardiogram, and pulse oximetry. Bi-level positive airway pressure (BiPAP) was initiated when the patient met split night criteria and was titrated according to treat sleep-disordered breathing.  RESPIRATORY PARAMETERS Diagnostic  Total AHI (/hr): 69.0 RDI (/hr): 95.6 OA Index (/hr): 18.1 CA Index (/hr): 46.7 REM AHI (/hr): N/A NREM AHI (/hr): 69.0 Supine AHI (/hr): 101.1 Non-supine AHI (/hr): 94.51 Min O2 Sat (%): 93.0 Mean O2 (%): 96.2 Time below 88% (min): 0   Titration  Optimal IPAP Pressure (cm): 16 Optimal EPAP Pressure (cm): 12 AHI at Optimal Pressure (/hr): 0.0 Min O2 at Optimal Pressure (%): 95.0 Sleep % at Optimal (%): 97 Supine % at Optimal (%): 0    SLEEP ARCHITECTURE The study was initiated at 10:24:15 PM and terminated at 4:29:28 AM. The total recorded time was 365.2 minutes. EEG confirmed total sleep time was 329 minutes yielding a sleep efficiency of 90.1%%. Sleep onset after lights out was 2.9 minutes with a REM latency of 281.0 minutes. The patient spent 20.8%% of the night in stage N1 sleep, 63.1%% in  stage N2 sleep, 14.3%% in stage N3 and 1.8% in REM. Wake after sleep onset (WASO) was 33.3 minutes. The Arousal Index was 68.8/hour.  LEG MOVEMENT DATA The total Periodic Limb Movements of Sleep (PLMS) were 0. The PLMS index was 0.0 .  CARDIAC DATA The 2 lead EKG demonstrated sinus rhythm. The mean heart rate was 100.0 beats per minute. Other EKG findings include: PVCs.  IMPRESSIONS - Severe obstructive sleep apnea occurred during the diagnostic portion of the study (AHI = 69.0 /hour). - CPAP was converted to BIPAP titration after appearance of central and mixed apneas. An optimal BIPAP pressure was selected for this patient ( 16 /12 cm of water) - Moderate central sleep apnea occurred during the diagnostic portion of the study (CAI = 46.7/hour). - The patient had minimal or no oxygen desaturation during the diagnostic portion of the study (Min O2 = 93.0%) - No snoring was audible during this study. - EKG findings include PVCs. - Clinically significant periodic limb movements of sleep did not occur during the study.  DIAGNOSIS - Obstructive Sleep Apnea (327.23 [G47.33 ICD-10])  RECOMMENDATIONS - Trial of BiPAP therapy on 16/12 cm H2O. Patient used a Medium size Resmed Full Face Mask AirFit F30 mask and heated humidification. - Be careful with alcohol, sedatives and other CNS depressants that may worsen sleep apnea and disrupt normal sleep architecture. - Sleep hygiene should be reviewed to assess factors that may improve sleep quality. - Weight management and regular exercise should be initiated or continued.  [Electronically signed] 05/23/2018 11:27 AM  Baird Lyons MD, [place], American Board of Sleep Medicine   NPI: [id number]  [place], Tax adviser of Sleep Medicine  ELECTRONICALLY SIGNED ON:  05/23/2018, 11:25 AM [place] PH: [phone]   FX: [phone] [place]

## 2018-05-24 ENCOUNTER — Other Ambulatory Visit: Payer: Self-pay

## 2018-05-24 ENCOUNTER — Telehealth: Payer: Self-pay | Admitting: Internal Medicine

## 2018-05-24 ENCOUNTER — Telehealth: Payer: Self-pay | Admitting: *Deleted

## 2018-05-24 DIAGNOSIS — D509 Iron deficiency anemia, unspecified: Secondary | ICD-10-CM | POA: Diagnosis not present

## 2018-05-24 DIAGNOSIS — R7881 Bacteremia: Secondary | ICD-10-CM | POA: Diagnosis not present

## 2018-05-24 DIAGNOSIS — N2581 Secondary hyperparathyroidism of renal origin: Secondary | ICD-10-CM | POA: Diagnosis not present

## 2018-05-24 DIAGNOSIS — I120 Hypertensive chronic kidney disease with stage 5 chronic kidney disease or end stage renal disease: Secondary | ICD-10-CM | POA: Diagnosis not present

## 2018-05-24 DIAGNOSIS — Z992 Dependence on renal dialysis: Secondary | ICD-10-CM | POA: Diagnosis not present

## 2018-05-24 DIAGNOSIS — I330 Acute and subacute infective endocarditis: Secondary | ICD-10-CM | POA: Diagnosis not present

## 2018-05-24 DIAGNOSIS — D631 Anemia in chronic kidney disease: Secondary | ICD-10-CM | POA: Diagnosis not present

## 2018-05-24 DIAGNOSIS — Z5181 Encounter for therapeutic drug level monitoring: Secondary | ICD-10-CM | POA: Diagnosis not present

## 2018-05-24 DIAGNOSIS — B957 Other staphylococcus as the cause of diseases classified elsewhere: Secondary | ICD-10-CM | POA: Diagnosis not present

## 2018-05-24 DIAGNOSIS — N186 End stage renal disease: Secondary | ICD-10-CM | POA: Diagnosis not present

## 2018-05-24 NOTE — Telephone Encounter (Signed)
Patient caretaker [name] called to advise the patient is very moody and depressed and he thinks he needs to be taken off of some medication specifically the Vimpat if possible. He also ask that the provider give the patient a call. Advised will send the provider a message to let hin knoe what is going on and ask about the medication. Advised someone will call once he responds.

## 2018-05-24 NOTE — Patient Outreach (Signed)
[facility]) Care Management  05/24/2018  BALDO HUFNAGLE [birth year] [id number]   62 year old with recent admission 8/11-8/16 with ESRD/HD, Bacteremia, MRSE aortic valve. Primary care office listed as completing transition of care.  History of ESRD/HD on home dialysis, aortic valve insufficiency, COPD, HTN, hepatitis B,HIV,fall.   RNCM called to follow up to schedule home visit. Spoke with client who confirmed date of birth and address. Mr. Holberg referred the call to his brother to reschedule home visit.  Plan: home visit scheduled for this week.  [name], RN, MSN, [place] Coordinator Cell: [phone]

## 2018-05-24 NOTE — Telephone Encounter (Signed)
Pt's caregiver calling back about his Sleep Study Results and would ilke for Dr Eileen Stanford to call back if possible.

## 2018-05-25 NOTE — Telephone Encounter (Signed)
I spoke with his brother, gave results of sleep study. OSA confirmed and got Bipap measurements. I will order BIpap now, I answered his questions.

## 2018-05-26 ENCOUNTER — Other Ambulatory Visit: Payer: Self-pay | Admitting: Infectious Diseases

## 2018-05-26 DIAGNOSIS — I120 Hypertensive chronic kidney disease with stage 5 chronic kidney disease or end stage renal disease: Secondary | ICD-10-CM | POA: Diagnosis not present

## 2018-05-26 DIAGNOSIS — N186 End stage renal disease: Secondary | ICD-10-CM | POA: Diagnosis not present

## 2018-05-26 DIAGNOSIS — Z992 Dependence on renal dialysis: Secondary | ICD-10-CM | POA: Diagnosis not present

## 2018-05-26 DIAGNOSIS — F419 Anxiety disorder, unspecified: Secondary | ICD-10-CM

## 2018-05-26 DIAGNOSIS — N2581 Secondary hyperparathyroidism of renal origin: Secondary | ICD-10-CM | POA: Diagnosis not present

## 2018-05-26 DIAGNOSIS — D631 Anemia in chronic kidney disease: Secondary | ICD-10-CM | POA: Diagnosis not present

## 2018-05-26 DIAGNOSIS — I330 Acute and subacute infective endocarditis: Secondary | ICD-10-CM | POA: Diagnosis not present

## 2018-05-26 DIAGNOSIS — R7881 Bacteremia: Secondary | ICD-10-CM | POA: Diagnosis not present

## 2018-05-26 DIAGNOSIS — B957 Other staphylococcus as the cause of diseases classified elsewhere: Secondary | ICD-10-CM | POA: Diagnosis not present

## 2018-05-26 DIAGNOSIS — D509 Iron deficiency anemia, unspecified: Secondary | ICD-10-CM | POA: Diagnosis not present

## 2018-05-26 NOTE — Telephone Encounter (Signed)
i'd have his PCP, whomever wrote vipmat f/u Thanks

## 2018-05-27 ENCOUNTER — Other Ambulatory Visit: Payer: Self-pay

## 2018-05-27 ENCOUNTER — Emergency Department (HOSPITAL_COMMUNITY): Payer: Medicare Other

## 2018-05-27 ENCOUNTER — Encounter (HOSPITAL_COMMUNITY): Payer: Self-pay | Admitting: Emergency Medicine

## 2018-05-27 ENCOUNTER — Emergency Department (HOSPITAL_COMMUNITY)
Admission: EM | Admit: 2018-05-27 | Discharge: 2018-05-28 | Disposition: A | Discharge status: Home / Self Care | Payer: Medicare Other | Attending: Emergency Medicine | Admitting: Emergency Medicine

## 2018-05-27 ENCOUNTER — Ambulatory Visit: Payer: Medicare Other

## 2018-05-27 DIAGNOSIS — I120 Hypertensive chronic kidney disease with stage 5 chronic kidney disease or end stage renal disease: Secondary | ICD-10-CM | POA: Diagnosis not present

## 2018-05-27 DIAGNOSIS — K5900 Constipation, unspecified: Secondary | ICD-10-CM | POA: Diagnosis not present

## 2018-05-27 DIAGNOSIS — N186 End stage renal disease: Secondary | ICD-10-CM | POA: Insufficient documentation

## 2018-05-27 DIAGNOSIS — Z79899 Other long term (current) drug therapy: Secondary | ICD-10-CM | POA: Insufficient documentation

## 2018-05-27 DIAGNOSIS — Z955 Presence of coronary angioplasty implant and graft: Secondary | ICD-10-CM | POA: Insufficient documentation

## 2018-05-27 DIAGNOSIS — D631 Anemia in chronic kidney disease: Secondary | ICD-10-CM | POA: Diagnosis not present

## 2018-05-27 DIAGNOSIS — B957 Other staphylococcus as the cause of diseases classified elsewhere: Secondary | ICD-10-CM | POA: Diagnosis not present

## 2018-05-27 DIAGNOSIS — Z87891 Personal history of nicotine dependence: Secondary | ICD-10-CM | POA: Diagnosis not present

## 2018-05-27 DIAGNOSIS — I330 Acute and subacute infective endocarditis: Secondary | ICD-10-CM | POA: Diagnosis not present

## 2018-05-27 DIAGNOSIS — R0602 Shortness of breath: Secondary | ICD-10-CM | POA: Diagnosis not present

## 2018-05-27 DIAGNOSIS — I2510 Atherosclerotic heart disease of native coronary artery without angina pectoris: Secondary | ICD-10-CM | POA: Insufficient documentation

## 2018-05-27 DIAGNOSIS — Z5181 Encounter for therapeutic drug level monitoring: Secondary | ICD-10-CM | POA: Diagnosis not present

## 2018-05-27 DIAGNOSIS — J449 Chronic obstructive pulmonary disease, unspecified: Secondary | ICD-10-CM | POA: Insufficient documentation

## 2018-05-27 DIAGNOSIS — R7989 Other specified abnormal findings of blood chemistry: Secondary | ICD-10-CM | POA: Diagnosis not present

## 2018-05-27 DIAGNOSIS — J90 Pleural effusion, not elsewhere classified: Secondary | ICD-10-CM | POA: Diagnosis not present

## 2018-05-27 DIAGNOSIS — R7881 Bacteremia: Secondary | ICD-10-CM | POA: Diagnosis not present

## 2018-05-27 LAB — BRAIN NATRIURETIC PEPTIDE: B NATRIURETIC PEPTIDE 5: 1135.9 pg/mL — AB (ref 0.0–100.0)

## 2018-05-27 LAB — COMPREHENSIVE METABOLIC PANEL
ALK PHOS: 84 U/L (ref 38–126)
ALT: 12 U/L (ref 0–44)
ANION GAP: 17 — AB (ref 5–15)
AST: 21 U/L (ref 15–41)
Albumin: 3.4 g/dL — ABNORMAL LOW (ref 3.5–5.0)
BILIRUBIN TOTAL: 0.7 mg/dL (ref 0.3–1.2)
BUN: 59 mg/dL — AB (ref 8–23)
CALCIUM: 8.9 mg/dL (ref 8.9–10.3)
CO2: 22 mmol/L (ref 22–32)
Chloride: 98 mmol/L (ref 98–111)
Creatinine, Ser: 10.07 mg/dL — ABNORMAL HIGH (ref 0.61–1.24)
GFR calc Af Amer: 6 mL/min — ABNORMAL LOW (ref >60)
GFR calc non Af Amer: 5 mL/min — ABNORMAL LOW (ref >60)
GLUCOSE: 103 mg/dL — AB (ref 70–99)
POTASSIUM: 3.3 mmol/L — AB (ref 3.5–5.1)
SODIUM: 137 mmol/L (ref 135–145)
TOTAL PROTEIN: 7.3 g/dL (ref 6.5–8.1)

## 2018-05-27 LAB — CBC WITH DIFFERENTIAL/PLATELET
ABS IMMATURE GRANULOCYTES: 0 10*3/uL (ref 0.0–0.1)
BASOS ABS: 0 10*3/uL (ref 0.0–0.1)
BASOS PCT: 1 %
EOS ABS: 0.5 10*3/uL (ref 0.0–0.7)
Eosinophils Relative: 11 %
HCT: 35.2 % — ABNORMAL LOW (ref 39.0–52.0)
Hemoglobin: 11.4 g/dL — ABNORMAL LOW (ref 13.0–17.0)
Immature Granulocytes: 1 %
Lymphocytes Relative: 13 %
Lymphs Abs: 0.7 10*3/uL (ref 0.7–4.0)
MCH: 29.9 pg (ref 26.0–34.0)
MCHC: 32.4 g/dL (ref 30.0–36.0)
MCV: 92.4 fL (ref 78.0–100.0)
MONO ABS: 0.6 10*3/uL (ref 0.1–1.0)
Monocytes Relative: 11 %
Neutro Abs: 3.1 10*3/uL (ref 1.7–7.7)
Neutrophils Relative %: 63 %
PLATELETS: 130 10*3/uL — AB (ref 150–400)
RBC: 3.81 MIL/uL — ABNORMAL LOW (ref 4.22–5.81)
RDW: 17.3 % — AB (ref 11.5–15.5)
WBC: 5 10*3/uL (ref 4.0–10.5)

## 2018-05-27 LAB — I-STAT TROPONIN, ED: Troponin i, poc: 0.08 ng/mL (ref 0.00–0.08)

## 2018-05-27 LAB — PROTIME-INR
INR: 1.21
PROTHROMBIN TIME: 15.2 s (ref 11.4–15.2)

## 2018-05-27 LAB — I-STAT CG4 LACTIC ACID, ED: LACTIC ACID, VENOUS: 1.1 mmol/L (ref 0.5–1.9)

## 2018-05-27 LAB — APTT: aPTT: 39 seconds — ABNORMAL HIGH (ref 24–36)

## 2018-05-27 MED ORDER — ALBUTEROL SULFATE (2.5 MG/3ML) 0.083% IN NEBU
5.0000 mg | INHALATION_SOLUTION | Freq: Once | RESPIRATORY_TRACT | Status: AC
  Administered 2018-05-27: 5 mg via RESPIRATORY_TRACT
  Filled 2018-05-27: qty 6

## 2018-05-27 NOTE — ED Triage Notes (Signed)
Pt presents with SOB x 2 days and constipation x 4 days; last BM 4 days ago; pt is HD MWF&sat (denies complications), denies CP, denies swelling, denies fever, denies wheezing

## 2018-05-27 NOTE — ED Provider Notes (Signed)
[facility] EMERGENCY DEPARTMENT Provider Note   CSN: [id number] Arrival date & time: 05/27/18  1934     History   Chief Complaint Chief Complaint  Patient presents with  . Shortness of Breath  . Constipation    HPI [place] [name] is a 62 y.o. male.  Patient with complicated medical history including ESRD-HD, HTN, HIV, recent hospitalization for endocarditis, asthma, seizures presents with complaint of constipation for the past 4 days. He states he usually has frequent loose stools. He reports left sided abdominal discomfort. He continues to pass gas and reports no abdominal surgeries in the past. No fever. He states he is short of breath today, starting this morning. He does HD at home and states that this morning he became sick on his stomach and dialysis session was cut short after 1.5 liters were taken off. No further nausea or vomiting through the day. He has used 2 bottles of mag citrate at home during today without relief of the constipation. The patient states he feels his SOB is due to constipation and abdominal distention. No cough, wheezing.   The history is provided by the patient. No language interpreter was used.  Shortness of Breath  Associated symptoms include vomiting and abdominal pain. Pertinent negatives include no fever, no cough and no chest pain.  Constipation   Associated symptoms include abdominal pain.    Past Medical History:  Diagnosis Date  . Anemia   . Anxiety   . Arthritis   . Asthma    per pt hx  . End stage renal disease on home HD 07/10/2011   Started HD in September 2012 at [facility] with a tunneled HD catheter, now on home HD with NxtStage. Dialyzing through AVF L lower arm with buttonhole technique as of mid 2014. His brother does the HD treatments at home.  They are roommates for 23 years.  The brother works 3rd shift and gets off about 8am and then puts [name] on HD in the morning after getting home. Most of the time he does HD  about 4 times a week, for about 4 hours per treatment. Cause of ESRD was HTN according to patient. He says he let his health go and ending up with complications, and that he didn't like seeing doctors in those days.  He says he was diagnosed with severe HTN when he lived in [place] in his 80's.   . Hepatitis B carrier ([name])   . HIV infection ([name])   . Hypertension   . Hyperthyroidism    normal now  . Pneumonia several yrs ago  . Seizure ([name])   . Seizures ([name]) 06/02/2011   x 1 none since  . Thrombocytopenia [facility])     Patient Active Problem List   Diagnosis Date Noted  . Onychomycosis of right great toe 05/09/2018  . 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 Acute bacterial endocarditis   . Left shoulder pain   . 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 Coagulase negative Staphylococcus bacteremia   . Hypervolemia   . ESRD on hemodialysis ([place])   . Asymptomatic HIV infection ([place])   . CAD (coronary artery disease) 04/19/2018  . Dyspnea 04/10/2018  . Loud snoring 04/08/2018  . Upper GI bleed 04/08/2018  . Malnutrition of moderate degree 02/23/2018  . Seizure ([place]) 02/22/2018  . Encounter for central line placement   . Lactic acidosis   . Acute uremia   . Mass of duodenum   .  Anemia of chronic kidney failure, unspecified stage   . Infectious joint disease 10/04/2017  . Symptomatic anemia 09/23/2017  . Antibiotic long-term use   . Septic arthritis ([place]) 09/15/2017  . Encounter for incision and drainage procedure 09/15/2017  . Effusion of left knee 09/08/2017  . Intertrigo 07/09/2017  . AC joint arthropathy 06/16/2017  . Pseudoaneurysm of arteriovenous dialysis fistula ([place]) 12/23/2016  . Eczema 12/10/2016  . Bilateral low back pain without sciatica   . Aortic valve insufficiency   . Fall 09/08/2016  . Knee abrasion, right, initial encounter 09/08/2016  . Lumbosacral spondylosis without myelopathy 08/03/2016  . Pulmonary nodules 08/03/2016  .  Essential hypertension   . Anemia of chronic disease 04/08/2016  . Secondary hyperparathyroidism ([place]) 02/09/2014  . Asthma 02/09/2014  . Nuclear sclerotic cataract of both eyes 12/12/2013  . Myopia with presbyopia of both eyes 10/26/2013  . COPD (chronic obstructive pulmonary disease) ([place]) 06/27/2013  . Thrombocytopenia ([place]) 01/09/2013  . HBV (hepatitis B virus) infection 01/09/2013  . Anxiety 07/22/2011  . End stage renal disease on home HD 07/10/2011  . HIV disease ([place]) 06/17/2011    Past Surgical History:  Procedure Laterality Date  . AV FISTULA PLACEMENT  06/02/11   Left radiocephalic AVF  . BIOPSY  02/17/2018   Procedure: BIOPSY;  Surgeon: Milus Banister, MD;  Location: [facility] ENDOSCOPY;  Service: Endoscopy;;  . COLONOSCOPY    . COLONOSCOPY WITH PROPOFOL N/A 01/27/2018   Procedure: COLONOSCOPY WITH PROPOFOL;  Surgeon: [name], MD;  Location: [facility] ENDOSCOPY;  Service: Gastroenterology;  Laterality: N/A;  . CORONARY ANGIOPLASTY    . ERCP  03/21/2018   AT [place] [place]  . ESOPHAGOGASTRODUODENOSCOPY N/A 02/17/2018   Procedure: ESOPHAGOGASTRODUODENOSCOPY (EGD);  Surgeon: Milus Banister, MD;  Location: [facility] ENDOSCOPY;  Service: Endoscopy;  Laterality: N/A;  . ESOPHAGOGASTRODUODENOSCOPY (EGD) WITH PROPOFOL N/A 01/27/2018   Procedure: ESOPHAGOGASTRODUODENOSCOPY (EGD) WITH PROPOFOL;  Surgeon: [name], MD;  Location: [facility] ENDOSCOPY;  Service: Gastroenterology;  Laterality: N/A;  . ESOPHAGOGASTRODUODENOSCOPY (EGD) WITH PROPOFOL N/A 04/03/2018   Procedure: ESOPHAGOGASTRODUODENOSCOPY (EGD) WITH PROPOFOL;  Surgeon: Clarene Essex, MD;  Location: [place];  Service: Endoscopy;  Laterality: N/A;  . EUS N/A 02/17/2018   Procedure: UPPER ENDOSCOPIC ULTRASOUND (EUS) RADIAL;  Surgeon: Milus Banister, MD;  Location: [facility] ENDOSCOPY;  Service: Endoscopy;  Laterality: N/A;  . fistulaogram     x 2 last 2 years  . IR DIALY SHUNT INTRO NEEDLE/INTRACATH INITIAL W/IMG LEFT Left 04/20/2018  . IR FLUORO  GUIDE CV LINE RIGHT  09/16/2017  . IR US GUIDE VASC ACCESS RIGHT  09/16/2017  . IRRIGATION AND DEBRIDEMENT KNEE Left 09/15/2017   Procedure: IRRIGATION AND DEBRIDEMENT KNEE; arthroscopic clean out;  Surgeon: [name], MD;  Location: [facility] ORS;  Service: Orthopedics;  Laterality: Left;  . OTHER SURGICAL HISTORY     removal temporary HD catheter   . REVISON OF ARTERIOVENOUS FISTULA Left 10/10/2015   Procedure: REVISON OF LEFT RADIOCEPHALIC ARTERIOVENOUS FISTULA;  Surgeon: Angelia Mould, MD;  Location: [facility];  Service: Vascular;  Laterality: Left;  . REVISON OF ARTERIOVENOUS FISTULA Left 02/07/2016   Procedure: REPAIR OF PSEUDO-ANEUREYSM OF LEFT ARM  ARTERIOVENOUS FISTULA;  Surgeon: Angelia Mould, MD;  Location: [facility];  Service: Vascular;  Laterality: Left;  . REVISON OF ARTERIOVENOUS FISTULA Left 03/04/4314   Procedure: PLICATION OF LEFT ARM RADIOCEPHALIC ARTERIOVENOUS FISTULA PSEUDOANEURYSM;  Surgeon: Angelia Mould, MD;  Location: [facility];  Service: Vascular;  Laterality: Left;  . RIGHT/LEFT HEART  CATH AND CORONARY ANGIOGRAPHY N/A 02/17/2017   Procedure: Right/Left Heart Cath and Coronary Angiography;  Surgeon: Sherren Mocha, MD;  Location: [facility] CV LAB;  Service: Cardiovascular;  Laterality: N/A;  . TEE WITHOUT CARDIOVERSION N/A 09/11/2016   Procedure: TRANSESOPHAGEAL ECHOCARDIOGRAM (TEE);  Surgeon: Dorothy Spark, MD;  Location: [facility];  Service: Cardiovascular;  Laterality: N/A;  . TEE WITHOUT CARDIOVERSION N/A 05/05/2018   Procedure: TRANSESOPHAGEAL ECHOCARDIOGRAM (TEE);  Surgeon: Adrian Prows, MD;  Location: [facility];  Service: Cardiovascular;  Laterality: N/A;  Prefer after 3:30 PM        Home Medications    Prior to Admission medications   Medication Sig Start Date End Date Taking? Authorizing Provider  albuterol (PROVENTIL) (2.5 MG/3ML) 0.083% nebulizer solution Take 2.5 mg by nebulization every 6 (six) hours as needed for wheezing or  shortness of breath.    [provider]  bictegravir-emtricitabine-tenofovir AF (BIKTARVY) 50-200-25 MG TABS tablet Take 1 tablet by mouth daily. 12/27/17   Campbell Riches, MD  calcium acetate (PHOSLO) 667 MG capsule Take 667-2,001 mg by mouth See admin instructions. Take 1,334 mg three times a day with each meal and 667 mg with each snack 06/06/15   [provider]  carvedilol (COREG) 25 MG tablet Take 1 tablet (25 mg total) by mouth 2 (two) times daily with a meal. 04/04/18   Alphonzo Grieve, MD  cloNIDine (CATAPRES - DOSED IN MG/24 HR) 0.2 mg/24hr patch Place 1 patch (0.2 mg total) onto the skin once a week. 04/19/18   Richardson Dopp T, PA-C  clotrimazole-betamethasone (LOTRISONE) cream Apply 1 application topically 2 (two) times daily. 09/08/17   Campbell Riches, MD  epoetin alfa (EPOGEN,PROCRIT) 2000 UNIT/ML injection Inject 2,000 Units into the vein 3 (three) times a week.     [provider]  ethyl chloride spray Apply 1 application topically daily as needed (HD- to numb).  05/20/15   [provider]  heparin 1000 UNIT/ML injection Inject 3,000 Units into the vein 4 (four) times a week.     [provider]  hydrALAZINE (APRESOLINE) 100 MG tablet Take 1 tablet (100 mg total) by mouth 3 (three) times daily. 04/14/18   Velna Ochs, MD  HYDROcodone-acetaminophen (NORCO/VICODIN) 5-325 MG tablet Take 1 tablet by mouth every 6 (six) hours as needed. 05/09/18   Jean Rosenthal, MD  Ipratropium-Albuterol (COMBIVENT IN) Inhale 2 puffs into the lungs every 6 (six) hours as needed (as needed for shortness of breath).     [provider]  iron sucrose (VENOFER) 20 MG/ML injection Inject 100 mg into the vein every Monday.     [provider]  lacosamide (VIMPAT) 50 MG TABS tablet Take 1.5 tablets (75 mg total) by mouth 2 (two) times daily. 05/09/18   Bartholomew Crews, MD  lacosamide (VIMPAT) 50 MG TABS tablet Take one half pill (25 mg) after  hemodialysis 05/09/18   Bartholomew Crews, MD  megestrol (MEGACE) 40 MG tablet Take 2 tablets (80 mg total) by mouth 2 (two) times daily. Patient taking differently: Take 40 mg by mouth daily.  02/28/18   Kathi Ludwig, MD  multivitamin (RENA-VIT) TABS tablet Take 1 tablet by mouth daily.      [provider]  omeprazole (PRILOSEC) 20 MG capsule Take 1 capsule (20 mg total) by mouth daily. 01/31/18   [name], MD  traZODone (DESYREL) 50 MG tablet TAKE 1 TABLET(50 MG) BY MOUTH AT BEDTIME AS NEEDED FOR SLEEP 05/26/18   Hatcher,  Doroteo Bradford, MD  vancomycin IVPB Inject 750 mg into the vein every dialysis. Indication: MRSE bacteremia/endocarditis Last Day of Therapy:  06/13/18 Labs - Sunday/Monday:  CBC/D, BMP, and vancomycin trough. Labs - Thursday:  BMP and vancomycin trough Labs - Every other week:  ESR and CRP Please make sure this is administered after dialysis on dialysis days. 05/06/18 06/13/18  Oval Linsey, MD  VOLTAREN 1 % GEL APPLY 2 GRAMS EXTERNALLY TO THE AFFECTED AREA FOUR TIMES DAILY 05/09/18   [place] Rosenthal, MD    Family History Family History  Problem Relation Age of Onset  . Hypertension Mother   . Diabetes Mother   . Cancer Father     Social History Social History   Tobacco Use  . Smoking status: Former Smoker    Years: 10.00    Types: Cigarettes    Last attempt to quit: 08/08/2015    Years since quitting: 2.8  . Smokeless tobacco: Never Used  . Tobacco comment: casual smoking for 10 years  Substance Use Topics  . Alcohol use: No    Alcohol/week: 0.0 standard drinks    Comment: quit 2004  . Drug use: No    Comment: uds (+) cocaine in 08/2011 but pt states was taking sudafed at the time     Allergies   Lisinopril; Penicillins; and Morphine and related   Review of Systems Review of Systems  Constitutional: Negative for chills, diaphoresis and fever.  HENT: Negative.  Negative for congestion.   Respiratory: Positive for shortness of breath.  Negative for cough.   Cardiovascular: Negative.  Negative for chest pain.  Gastrointestinal: Positive for abdominal pain, constipation, nausea and vomiting.  Musculoskeletal: Negative.  Negative for back pain and myalgias.  Skin: Negative.   Neurological: Negative.      Physical Exam Updated Vital Signs BP (!) 180/81 (BP Location: Right Arm)   Pulse 69   Temp 99.8 F (37.7 C) (Oral)   Resp 16   Wt 76.2 kg   SpO2 100%   BMI 22.78 kg/m   Physical Exam  Constitutional: He is oriented to person, place, and time. He appears well-developed and well-nourished.  HENT:  Head: Normocephalic.  Neck: Normal range of motion. Neck supple.  Cardiovascular: Normal rate and regular rhythm.  Pulmonary/Chest: Effort normal. No respiratory distress. He has wheezes in the right middle field and the right lower field. He has no rhonchi. He has no rales. He exhibits no tenderness.  Abdominal: Soft. Bowel sounds are normal. He exhibits distension. There is tenderness (Mild tenderness to LUQ abdomen). There is no rebound and no guarding.  Musculoskeletal: Normal range of motion.  Neurological: He is alert and oriented to person, place, and time.  Skin: Skin is warm and dry. No rash noted.  Psychiatric: He has a normal mood and affect.     ED Treatments / Results  Labs (all labs ordered are listed, but only abnormal results are displayed) Labs Reviewed  CBC WITH DIFFERENTIAL/PLATELET - Abnormal; Notable for the following components:      Result Value   RBC 3.81 (*)    Hemoglobin 11.4 (*)    HCT 35.2 (*)    RDW 17.3 (*)    Platelets 130 (*)    All other components within normal limits  COMPREHENSIVE METABOLIC PANEL - Abnormal; Notable for the following components:   Potassium 3.3 (*)    Glucose, Bld 103 (*)    BUN 59 (*)    Creatinine, Ser 10.07 (*)  Albumin 3.4 (*)    GFR calc non Af Amer 5 (*)    GFR calc Af Amer 6 (*)    Anion gap 17 (*)    All other components within normal limits   APTT - Abnormal; Notable for the following components:   aPTT 39 (*)    All other components within normal limits  CULTURE, BLOOD (ROUTINE X 2)  CULTURE, BLOOD (ROUTINE X 2)  PROTIME-INR  BRAIN NATRIURETIC PEPTIDE  I-STAT CG4 LACTIC ACID, ED  I-STAT TROPONIN, ED  I-STAT CG4 LACTIC ACID, ED    EKG None  Radiology Dg Chest 2 View  Result Date: 05/27/2018 CLINICAL DATA:  Shortness of breath EXAM: CHEST - 2 VIEW COMPARISON:  05/02/2018, 05/01/2018, 04/17/2018 FINDINGS: Small left-sided pleural effusion. Dense airspace disease at the left base. Cardiomegaly with vascular congestion and mild interstitial edema. Aortic atherosclerosis. No pneumothorax. IMPRESSION: 1. Small left pleural effusion and left basilar atelectasis or pneumonia, no change 2. Cardiomegaly with vascular congestion and mild pulmonary edema. Electronically Signed   By: Donavan Foil M.D.   On: 05/27/2018 20:46    Procedures Procedures (including critical care time)  Medications Ordered in ED Medications  albuterol (PROVENTIL) (2.5 MG/3ML) 0.083% nebulizer solution 5 mg (has no administration in time range)     Initial Impression / Assessment and Plan / ED Course  I have reviewed the triage vital signs and the nursing notes.  Pertinent labs & imaging results that were available during my care of the patient were reviewed by me and considered in my medical decision making (see chart for details).     Patient presents with SOB that started this morning and 4 days of constipation. Vomiting x 1 this morning without further nausea. He feels the constipation is contributing to SOB. He also had to stop dialysis early today and did not get the target amount of fluid off.   Imaging shows some stool but no excessive stool burden. He has a Fleet's enema at home to use. Also recommended Miralax.   The patient's CXR and labs, along with symptoms support fluid overload. Discussed admission and patient would prefer discharge  home. He will go home with his brother (caregiver) who will bring the patient back to the ED if symptoms worsen. They are comfortable with continued treatment of constipation at home. No other concerns.   The patient has been evaluated by Dr. Jeanell Sparrow and is felt appropriate for discharge home.   Final Clinical Impressions(s) / ED Diagnoses   Final diagnoses:  Constipation  dyspnea   ED Discharge Orders    None       Dennie Bible 05/27/18 2358    Pattricia Boss, MD 05/30/18 [id number]

## 2018-05-27 NOTE — ED Provider Notes (Signed)
Patient placed in Quick Look pathway, seen and evaluated   Chief Complaint: Shortness of breath, constipation  HPI:   [place] [name] history of endocarditis, is currently on vancomycin for this, he is here today with shortness of breath.  He also has constipation and has not been able to have a bowel movement in 4 days.  Denies abdominal pain.  He was found to be febrile here.  Does hemodialysis at home Monday Wednesday Friday Saturday.  ROS: Fever here  Physical Exam:   Gen: No distress  Neuro: Awake and Alert  Skin: Warm    Focused Exam: Lungs with decreased (but still present) sounds on right side.     Initiation of care has begun. The patient has been counseled on the process, plan, and necessity for staying for the completion/evaluation, and the remainder of the medical screening examination    [name] 05/27/18 Maryagnes Amos, MD 05/30/18 [id number]

## 2018-05-27 NOTE — Patient Outreach (Signed)
[facility]) Care Management  05/27/2018  [place] [name] [birth year] [id number]   Care Coordination: RNCM called regarding home visit scheduled for today. Spoke with client's brother, [name], who confirmed that client will be on the dialysis machine during the scheduled home visit time.    Client brother reports that client has been well, but had issues with being tired following his last dialysis treatment. He states that the next morning following dialysis, client woke up feeling his usual. He denies any problems at this time.  [name] reports client has been attending all his scheduled appointments.  Plan: home visit rescheduled.   [name], RN, MSN, [place] Coordinator Cell: [phone]

## 2018-05-27 NOTE — ED Notes (Signed)
Pt currently on abx for endocarditis

## 2018-05-27 NOTE — ED Notes (Signed)
Patient transported to X-ray 

## 2018-05-28 NOTE — Discharge Instructions (Addendum)
Use the enema you have at home to relieve constipation. Return here with any worsening symptoms of abdominal pain or distention, recurrent vomiting, worsening shortness of breath, fever, or for new concerns.

## 2018-05-30 ENCOUNTER — Ambulatory Visit (INDEPENDENT_AMBULATORY_CARE_PROVIDER_SITE_OTHER): Payer: Medicare Other | Admitting: Thoracic Surgery (Cardiothoracic Vascular Surgery)

## 2018-05-30 ENCOUNTER — Other Ambulatory Visit: Payer: Self-pay

## 2018-05-30 ENCOUNTER — Encounter: Payer: Self-pay | Admitting: Internal Medicine

## 2018-05-30 ENCOUNTER — Ambulatory Visit (INDEPENDENT_AMBULATORY_CARE_PROVIDER_SITE_OTHER): Payer: Medicare Other | Admitting: Internal Medicine

## 2018-05-30 VITALS — BP 168/79 | HR 55 | Temp 98.3°F | Ht 72.0 in | Wt 176.0 lb

## 2018-05-30 VITALS — BP 150/72 | HR 60 | Resp 20 | Ht 72.0 in | Wt 175.0 lb

## 2018-05-30 DIAGNOSIS — L299 Pruritus, unspecified: Secondary | ICD-10-CM | POA: Diagnosis not present

## 2018-05-30 DIAGNOSIS — Z792 Long term (current) use of antibiotics: Secondary | ICD-10-CM | POA: Diagnosis not present

## 2018-05-30 DIAGNOSIS — R569 Unspecified convulsions: Secondary | ICD-10-CM | POA: Diagnosis not present

## 2018-05-30 DIAGNOSIS — I252 Old myocardial infarction: Secondary | ICD-10-CM | POA: Diagnosis not present

## 2018-05-30 DIAGNOSIS — Z79899 Other long term (current) drug therapy: Secondary | ICD-10-CM

## 2018-05-30 DIAGNOSIS — G4733 Obstructive sleep apnea (adult) (pediatric): Secondary | ICD-10-CM | POA: Diagnosis not present

## 2018-05-30 DIAGNOSIS — D638 Anemia in other chronic diseases classified elsewhere: Secondary | ICD-10-CM | POA: Diagnosis not present

## 2018-05-30 DIAGNOSIS — N186 End stage renal disease: Secondary | ICD-10-CM

## 2018-05-30 DIAGNOSIS — I358 Other nonrheumatic aortic valve disorders: Secondary | ICD-10-CM | POA: Diagnosis not present

## 2018-05-30 DIAGNOSIS — Z8619 Personal history of other infectious and parasitic diseases: Secondary | ICD-10-CM | POA: Diagnosis not present

## 2018-05-30 DIAGNOSIS — Z992 Dependence on renal dialysis: Secondary | ICD-10-CM

## 2018-05-30 DIAGNOSIS — K3189 Other diseases of stomach and duodenum: Secondary | ICD-10-CM

## 2018-05-30 DIAGNOSIS — Z21 Asymptomatic human immunodeficiency virus [HIV] infection status: Secondary | ICD-10-CM

## 2018-05-30 DIAGNOSIS — I2510 Atherosclerotic heart disease of native coronary artery without angina pectoris: Secondary | ICD-10-CM | POA: Diagnosis not present

## 2018-05-30 DIAGNOSIS — D631 Anemia in chronic kidney disease: Secondary | ICD-10-CM | POA: Diagnosis not present

## 2018-05-30 DIAGNOSIS — I120 Hypertensive chronic kidney disease with stage 5 chronic kidney disease or end stage renal disease: Secondary | ICD-10-CM | POA: Diagnosis not present

## 2018-05-30 DIAGNOSIS — N2581 Secondary hyperparathyroidism of renal origin: Secondary | ICD-10-CM | POA: Diagnosis not present

## 2018-05-30 DIAGNOSIS — D509 Iron deficiency anemia, unspecified: Secondary | ICD-10-CM | POA: Diagnosis not present

## 2018-05-30 MED ORDER — HYDROXYZINE HCL 10 MG PO TABS: 10.0000 mg | ORAL_TABLET | Freq: Two times a day (BID) | ORAL | 0 refills | Status: DC | PRN

## 2018-05-30 NOTE — Patient Instructions (Addendum)
For Vimpat dosing: Take 75mg  twice daily and an extra 50mg  after dialysis. Keep your appointment with neurology and they will talk further with you about dose adjustment, changing this medicine or the possibility of stopping it in the future.  I will send a referral for [name] gastroeneterology for follow up on the ERCP as well as [facility] vascular surgery for your fistula draining.  I will place the order for CPAP home titration.  For itching, switch your soap to a sensitive skin (fragrance free) soap. You can take hydroxyzine 25mg  twice a day as needed for itching.

## 2018-05-30 NOTE — Progress Notes (Signed)
PCP is Velna Ochs, MD Referring Provider is Evans Lance, MD  Chief Complaint  Patient presents with  . Aortic Stenosis    ECHO 05/05/2018    HPI: Jeffrey [name] is sent for consultation regarding severe aortic insufficiency.  Jeffrey [name] is a 62 year old gentleman with a complex medical history including HIV, hepatitis B, end-stage renal disease on hemodialysis since 2012, aortic valve endocarditis, duodenal mass, hypertension, anemia of chronic disease, gout, seizures, hypothyroidism, peripheral arterial disease and thrombocytopenia.  He was hospitalized in November 2017 with low back pain.  He was found to have methicillin sensitive staph bacteremia and was treated with Ancef.  He was readmitted in December with shortness of breath and was found to have aortic insufficiency.  There was no definite vegetation.  Findings were consistent with aortic valve endocarditis.  Echocardiogram showed severe aortic insufficiency.  He was not sure he would want to undergo surgery at that time so he came back in June.  A follow-up transthoracic echocardiogram showed moderate aortic insufficiency.  He was doing well symptomatically.  He did have a catheterization which showed significant coronary calcifications.  There was a 35 to 40% left main stenosis.  He recently has been having problems with shortness of breath.  He was admitted to the hospital in August.  He had a methicillin-resistant staph epidermidis bacteremia.  Transesophageal echocardiography showed severe aortic insufficiency with a small vegetation on the non-and right coronary cusps.  There was no annular abscess.  He was started on vancomycin for that and remains on that medication.  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  There was some high-grade dysplasia.  He is waiting for a follow-up appointment at [facility] regarding that.  He denies shortness of breath but his brother who is his caretaker says  that he does get short of breath with minimal activities.  He denies any peripheral edema.  He is able to tolerate dialysis most the time but occasionally has to cut short.  He has dentures upper and lower. Past Medical History:  Diagnosis Date  . Anemia   . Anxiety   . Arthritis   . Asthma    per pt hx  . End stage renal disease on home HD 07/10/2011   Started HD in September 2012 at [facility] with a tunneled HD catheter, now on home HD with NxtStage. Dialyzing through AVF L lower arm with buttonhole technique as of mid 2014. His brother does the HD treatments at home.  They are roommates for 23 years.  The brother works 3rd shift and gets off about 8am and then puts Mr [name] on HD in the morning after getting home. Most of the time he does HD about 4 times a week, for about 4 hours per treatment. Cause of ESRD was HTN according to patient. He says he let his health go and ending up with complications, and that he didn't like seeing doctors in those days.  He says he was diagnosed with severe HTN when he lived in [place] in his 31's.   . Hepatitis B carrier ([place])   . HIV infection ([name])   . Hypertension   . Hyperthyroidism    normal now  . Pneumonia several yrs ago  . Seizure ([place])   . Seizures ([place]) 06/02/2011   x 1 none since  . Thrombocytopenia ([place])     Past Surgical History:  Procedure Laterality Date  . AV FISTULA PLACEMENT  06/02/11   Left  radiocephalic AVF  . BIOPSY  02/17/2018   Procedure: BIOPSY;  Surgeon: Milus Banister, MD;  Location: [facility] ENDOSCOPY;  Service: Endoscopy;;  . COLONOSCOPY    . COLONOSCOPY WITH PROPOFOL N/A 01/27/2018   Procedure: COLONOSCOPY WITH PROPOFOL;  Surgeon: [name], MD;  Location: [facility] ENDOSCOPY;  Service: Gastroenterology;  Laterality: N/A;  . CORONARY ANGIOPLASTY    . ERCP  03/21/2018   AT [place]  . ESOPHAGOGASTRODUODENOSCOPY N/A 02/17/2018   Procedure: ESOPHAGOGASTRODUODENOSCOPY (EGD);  Surgeon: Milus Banister, MD;  Location: [facility]  ENDOSCOPY;  Service: Endoscopy;  Laterality: N/A;  . ESOPHAGOGASTRODUODENOSCOPY (EGD) WITH PROPOFOL N/A 01/27/2018   Procedure: ESOPHAGOGASTRODUODENOSCOPY (EGD) WITH PROPOFOL;  Surgeon: [name], MD;  Location: [facility] ENDOSCOPY;  Service: Gastroenterology;  Laterality: N/A;  . ESOPHAGOGASTRODUODENOSCOPY (EGD) WITH PROPOFOL N/A 04/03/2018   Procedure: ESOPHAGOGASTRODUODENOSCOPY (EGD) WITH PROPOFOL;  Surgeon: Clarene Essex, MD;  Location: [place];  Service: Endoscopy;  Laterality: N/A;  . EUS N/A 02/17/2018   Procedure: UPPER ENDOSCOPIC ULTRASOUND (EUS) RADIAL;  Surgeon: Milus Banister, MD;  Location: [facility] ENDOSCOPY;  Service: Endoscopy;  Laterality: N/A;  . fistulaogram     x 2 last 2 years  . IR DIALY SHUNT INTRO NEEDLE/INTRACATH INITIAL W/IMG LEFT Left 04/20/2018  . IR FLUORO GUIDE CV LINE RIGHT  09/16/2017  . IR US GUIDE VASC ACCESS RIGHT  09/16/2017  . IRRIGATION AND DEBRIDEMENT KNEE Left 09/15/2017   Procedure: IRRIGATION AND DEBRIDEMENT KNEE; arthroscopic clean out;  Surgeon: [name], MD;  Location: [facility] ORS;  Service: Orthopedics;  Laterality: Left;  . OTHER SURGICAL HISTORY     removal temporary HD catheter   . REVISON OF ARTERIOVENOUS FISTULA Left 10/10/2015   Procedure: REVISON OF LEFT RADIOCEPHALIC ARTERIOVENOUS FISTULA;  Surgeon: Angelia Mould, MD;  Location: [place];  Service: Vascular;  Laterality: Left;  . REVISON OF ARTERIOVENOUS FISTULA Left 02/07/2016   Procedure: REPAIR OF PSEUDO-ANEUREYSM OF LEFT ARM  ARTERIOVENOUS FISTULA;  Surgeon: Angelia Mould, MD;  Location: [place];  Service: Vascular;  Laterality: Left;  . REVISON OF ARTERIOVENOUS FISTULA Left 01/23/6978   Procedure: PLICATION OF LEFT ARM RADIOCEPHALIC ARTERIOVENOUS FISTULA PSEUDOANEURYSM;  Surgeon: Angelia Mould, MD;  Location: [place];  Service: Vascular;  Laterality: Left;  . RIGHT/LEFT HEART CATH AND CORONARY ANGIOGRAPHY N/A 02/17/2017   Procedure: Right/Left Heart Cath and Coronary Angiography;   Surgeon: Sherren Mocha, MD;  Location: [place] CV LAB;  Service: Cardiovascular;  Laterality: N/A;  . TEE WITHOUT CARDIOVERSION N/A 09/11/2016   Procedure: TRANSESOPHAGEAL ECHOCARDIOGRAM (TEE);  Surgeon: Dorothy Spark, MD;  Location: [place];  Service: Cardiovascular;  Laterality: N/A;  . TEE WITHOUT CARDIOVERSION N/A 05/05/2018   Procedure: TRANSESOPHAGEAL ECHOCARDIOGRAM (TEE);  Surgeon: Adrian Prows, MD;  Location: [facility] ENDOSCOPY;  Service: Cardiovascular;  Laterality: N/A;  Prefer after 3:30 PM    Family History  Problem Relation Age of Onset  . Hypertension Mother   . Diabetes Mother   . Cancer Father     Social History Social History   Tobacco Use  . Smoking status: Former Smoker    Years: 10.00    Types: Cigarettes    Last attempt to quit: 08/08/2015    Years since quitting: 2.8  . Smokeless tobacco: Never Used  . Tobacco comment: casual smoking for 10 years  Substance Use Topics  . Alcohol use: No    Alcohol/week: 0.0 standard drinks    Comment: quit 2004  . Drug use: No    Comment: uds (+) cocaine  in 08/2011 but pt states was taking sudafed at the time    Current Outpatient Medications  Medication Sig Dispense Refill  . albuterol (PROVENTIL) (2.5 MG/3ML) 0.083% nebulizer solution Take 2.5 mg by nebulization every 6 (six) hours as needed for wheezing or shortness of breath.    . bictegravir-emtricitabine-tenofovir AF (BIKTARVY) 50-200-25 MG TABS tablet Take 1 tablet by mouth daily. 30 tablet 11  . calcium acetate (PHOSLO) 667 MG capsule Take 667-2,001 mg by mouth See admin instructions. Take 1,334 mg three times a day with each meal and 667 mg with each snack    . carvedilol (COREG) 25 MG tablet Take 1 tablet (25 mg total) by mouth 2 (two) times daily with a meal. 60 tablet 5  . cloNIDine (CATAPRES - DOSED IN MG/24 HR) 0.2 mg/24hr patch Place 1 patch (0.2 mg total) onto the skin once a week. 4 patch 12  . clotrimazole-betamethasone (LOTRISONE) cream Apply 1  application topically 2 (two) times daily. 30 g 0  . epoetin alfa (EPOGEN,PROCRIT) 2000 UNIT/ML injection Inject 2,000 Units into the vein 3 (three) times a week.     . ethyl chloride spray Apply 1 application topically daily as needed (HD- to numb).   12  . heparin 1000 UNIT/ML injection Inject 3,000 Units into the vein 4 (four) times a week.     . hydrALAZINE (APRESOLINE) 100 MG tablet Take 1 tablet (100 mg total) by mouth 3 (three) times daily. 90 tablet 1  . HYDROcodone-acetaminophen (NORCO/VICODIN) 5-325 MG tablet Take 1 tablet by mouth every 6 (six) hours as needed. 10 tablet 0  . hydrOXYzine (ATARAX/VISTARIL) 10 MG tablet Take 1 tablet (10 mg total) by mouth 2 (two) times daily as needed for itching. 30 tablet 0  . Ipratropium-Albuterol (COMBIVENT IN) Inhale 2 puffs into the lungs every 6 (six) hours as needed (as needed for shortness of breath).     . iron sucrose (VENOFER) 20 MG/ML injection Inject 100 mg into the vein every Monday.     . lacosamide (VIMPAT) 50 MG TABS tablet Take 1.5 tablets (75 mg total) by mouth 2 (two) times daily. 60 tablet 5  . lacosamide (VIMPAT) 50 MG TABS tablet Take one half pill (25 mg) after hemodialysis 30 tablet 5  . megestrol (MEGACE) 40 MG tablet Take 2 tablets (80 mg total) by mouth 2 (two) times daily. (Patient taking differently: Take 40 mg by mouth daily. ) 120 tablet 0  . multivitamin (RENA-VIT) TABS tablet Take 1 tablet by mouth daily.      Marland Kitchen omeprazole (PRILOSEC) 20 MG capsule Take 1 capsule (20 mg total) by mouth daily. 90 capsule 3  . traZODone (DESYREL) 50 MG tablet TAKE 1 TABLET(50 MG) BY MOUTH AT BEDTIME AS NEEDED FOR SLEEP 30 tablet 0  . vancomycin IVPB Inject 750 mg into the vein every dialysis. Indication: MRSE bacteremia/endocarditis Last Day of Therapy:  06/13/18 Labs - Sunday/Monday:  CBC/D, BMP, and vancomycin trough. Labs - Thursday:  BMP and vancomycin trough Labs - Every other week:  ESR and CRP Please make sure this is administered  after dialysis on dialysis days. 20 Units 0  . VOLTAREN 1 % GEL APPLY 2 GRAMS EXTERNALLY TO THE AFFECTED AREA FOUR TIMES DAILY 100 g 1   No current facility-administered medications for this visit.     Allergies  Allergen Reactions  . Lisinopril Anaphylaxis and Shortness Of Breath    Throat swelling  . Penicillins Anaphylaxis and Other (See Comments)    Childhood  allergy Has patient had a PCN reaction causing immediate rash, facial/tongue/throat swelling, SOB or lightheadedness with hypotension: Yes Has patient had a PCN reaction causing severe rash involving mucus membranes or skin necrosis: Unk Has patient had a PCN reaction that required hospitalization: Unk Has patient had a PCN reaction occurring within the last 10 years: No If all of the above answers are "NO", then may proceed with Cephalosporin use.   Marland Kitchen Morphine And Related Other (See Comments)    "Not Himself"    Review of Systems  Constitutional: Positive for fatigue. Negative for chills and fever.  HENT: Positive for dental problem (dentures). Negative for trouble swallowing and voice change.   Eyes: Negative for visual disturbance.  Respiratory: Positive for shortness of breath. Negative for chest tightness.   Cardiovascular: Positive for leg swelling. Negative for chest pain.  Gastrointestinal: Positive for abdominal distention.  Genitourinary: Negative for difficulty urinating and dysuria.  Neurological: Positive for seizures. Negative for syncope and weakness.  Hematological: Negative for adenopathy. Does not bruise/bleed easily.  All other systems reviewed and are negative.   BP (!) 150/72   Pulse 60   Resp 20   Ht 6' (1.829 m)   Wt 175 lb (79.4 kg)   SpO2 95% Comment: RA  BMI 23.73 kg/m  Physical Exam  Constitutional: He is oriented to person, place, and time.  Ill-appearing  HENT:  Head: Normocephalic and atraumatic.  Mouth/Throat: No oropharyngeal exudate.  Eyes: EOM are normal. No scleral icterus.   Neck: Neck supple. No thyromegaly present.  Cardiovascular: Normal rate and regular rhythm.  Murmur (2/6 systolic and diastolic murmur ) heard. Pulmonary/Chest: Effort normal. No respiratory distress. He has no wheezes. He has rales (faint at bases).  Abdominal: Soft. He exhibits no distension. There is no tenderness.  Musculoskeletal: He exhibits no edema or deformity.  Thrill left arm fistula  Lymphadenopathy:    He has no cervical adenopathy.  Neurological: He is alert and oriented to person, place, and time. No cranial nerve deficit. He exhibits normal muscle tone. Coordination normal.  Skin: Skin is warm and dry.  Vitals reviewed.    Diagnostic Tests: Study Conclusions  - Left ventricle: The LV apex is trabeculated and there is a false   tendon in the LV apex. Findings suggestive of non compaction. The   cavity size was mildly dilated. There was severe concentric   hypertrophy. Systolic function was normal. The estimated ejection   fraction was in the range of 55% to 60%. Wall motion was normal;   there were no regional wall motion abnormalities. Features are   consistent with a pseudonormal left ventricular filling pattern,   with concomitant abnormal relaxation and increased filling   pressure (grade 2 diastolic dysfunction). Doppler parameters are   consistent with high ventricular filling pressure. - Aortic valve: There was moderate to severe regurgitation. Valve   area (VTI): 3.43 cm^2. Valve area (Vmean): 3.45 cm^2. - Mitral valve: There was mild regurgitation. - Left atrium: The atrium was severely dilated. - Right ventricle: The cavity size was mildly dilated. Wall   thickness was normal. - Pulmonary arteries: PA peak pressure: 62 mm Hg (S). - Pericardium, extracardiac: A trivial pericardial effusion was   identified posterior to the heart. There was a left pleural   effusion.  Impressions:  - Normal LVF with severe LVH and mild LVE. There is a left  pleural   effusion and trivial pericardial effusion posteriorly. There is   mild MR and trivial PR.  The AV is not well visualized but there   appears to be moderate to severe AI of unclear etiology. Suggest   TEE to further evaluated AV. Consider Cardiac MRI to evaluate for   non compaction LV.  TEE: Under moderate sedation 3 mg versed and 25 mg Benadryl, TEE was performed without complications:  LV: Low Normal LVEF. Mild LV dilatation. Severe LVH.  RV: Normal LA: Normal. Left atrial appendage: Normal without thrombus. Normal function. Inter atrial septum is intact without defect. Double contrast study not done. RA: Normal, Rudimentary Eustachian valve noted.  MV: Normal Mild  MR. TV: Normal Trace TR AV: Tricuspid. The non-coronary cusp is perforated with resultant severe aortic regurgitation. No abscess noted. There is a small vegetation noted on the non and right coronary cusp. PV: Normal. Trace PI.  Thoracic and ascending aorta: Normal with very mild mixed plaque plaque in the descending thoracic aorta.  Conscious sedation protocol was followed, I personally administered conscious sedation and monitored the patient. Patient received 3 milligrams of Versed and 25 mg benadryl . Patient tolerated the procedure well and there was no complication from conscious sedation. Time administered was 21 minutes.        Electronically signed by Adrian Prows, MD at 05/05/2018 9:20 AM  I personally reviewed the TEE images and concur with the findings noted above  Impression: Jeffrey [name] is a 62 year old gentleman with a history of HIV, hepatitis B, end-stage renal disease on hemodialysis, aortic valve endocarditis, hypertension, hyperthyroidism, gout, peripheral arterial disease, seizures, and thrombocytopenia.  He now has recurrent aortic valve endocarditis.  His previous infection was methicillin sensitive staph aureus.  He now has MRSE.  He is on antibiotics for that with vancomycin.  His  echocardiogram shows severe aortic insufficiency.  He has normal systolic function there is mild left ventricular dilatation and severe left ventricular hypertrophy.  I do not have dimensions on the TEE but on his transthoracic echo his LV end-diastolic diameter was 53.2 mm and end-systolic 02.3 mm.  There is no absolute indication for surgery at this point.  Given his difficulties with dialysis and repeated episodes of shortness of breath I think he would benefit from an aortic valve replacement.  I do not think he is a good candidate for mechanical valve given his age comorbidities including thrombocytopenia.  He is at risk for early calcification and failure of a pericardial valve, but I think that is still a better option for him.  There is no urgent indication for surgery.  He still on antibiotics for his infection.  He has no fevers or chills or signs of persistent bacteremia.  He would like to wait until he completes his course of antibiotics before proceeding with surgery.  I will see him back in a month after he has completed his vancomycin.  He probably should have another cardiac catheterization as his last cath was about a year and a half ago and did show some moderate left main disease.  Plan: Return in 1 month to discuss timing of aortic valve replacement  [name], MD [facility] [phone]

## 2018-05-30 NOTE — Progress Notes (Signed)
   CC: pruritus  HPI:  JeffreyJeffrey [name] is a 62 y.o. 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  Patient endorses 2d h/o diffuse pruritus (excluding scalp), not associated with hives, bug bites, skin lesions, fevers, chills, nausea, vomiting. He states compliance with HD schedule. He has recently changed his soap from fragrance free to a soap with olive oil and cocoa butter which is scented.   Patient also endorses a few week h/o of prior button hole AVF access site drainage; he states that it will come to a head and then decrease in size once it drains; it drains daily. He denies associated pain, swelling, fevers, chills, nausea.  Please see problem based Assessment and Plan for status of patients chronic conditions.  Past Medical History:  Diagnosis Date  . Anemia   . Anxiety   . Arthritis   . Asthma    per pt hx  . End stage renal disease on home HD 07/10/2011   Started HD in September 2012 at [facility] with a tunneled HD catheter, now on home HD with NxtStage. Dialyzing through AVF L lower arm with buttonhole technique as of mid 2014. His brother does the HD treatments at home.  They are roommates for 23 years.  The brother works 3rd shift and gets off about 8am and then puts Jeffrey [name] on HD in the morning after getting home. Most of the time he does HD about 4 times a week, for about 4 hours per treatment. Cause of ESRD was HTN according to patient. He says he let his health go and ending up with complications, and that he didn't like seeing doctors in those days.  He says he was diagnosed with severe HTN when he lived in [place] in his 79's.   . Hepatitis B carrier ([place])   . HIV infection ([place])   . Hypertension   . Hyperthyroidism    normal now  . Pneumonia several yrs ago    . Seizure ([place])   . Seizures ([place]) 06/02/2011   x 1 none since  . Thrombocytopenia ([place])     Review of Systems:   Per HPI  Physical Exam:  Vitals:   05/30/18 1324  BP: (!) 168/79  Pulse: (!) 55  Temp: 98.3 F (36.8 C)  TempSrc: Oral  SpO2: 100%  Weight: 176 lb (79.8 kg)  Height: 6' (1.829 m)   GENERAL- alert, co-operative, appears older than stated age, not in any distress. CARDIAC- RRR, no murmurs, rubs or gallops. RESP- Moving equal volumes of air,  Bibasilar rales ABDOMEN- Soft, nontender, bowel sounds present. EXTREMITIES- left forearm AVF with palpable thrill; current buttonhole access clean and dry; previous buttonhole access with small amount of drainage on dressing, but none that was able to be expressed, no surrounding fluctuance/induration/erythema SKIN- Warm, dry, no rash or lesion.  Media Information   Document Information   Photos    05/30/2018 13:51  Attached To:  Office Visit on 05/30/18 with Alphonzo Grieve, MD  Source Information   Alphonzo Grieve, MD  [facility]    Assessment & Plan:   See Encounters Tab for problem based charting.   Patient discussed with Dr. Moshe Cipro, MD Internal Medicine PGY-3

## 2018-05-31 ENCOUNTER — Other Ambulatory Visit: Payer: Self-pay

## 2018-05-31 ENCOUNTER — Encounter: Payer: Self-pay | Admitting: Internal Medicine

## 2018-05-31 DIAGNOSIS — N186 End stage renal disease: Secondary | ICD-10-CM | POA: Diagnosis not present

## 2018-05-31 DIAGNOSIS — G4733 Obstructive sleep apnea (adult) (pediatric): Secondary | ICD-10-CM | POA: Insufficient documentation

## 2018-05-31 DIAGNOSIS — Z992 Dependence on renal dialysis: Secondary | ICD-10-CM | POA: Diagnosis not present

## 2018-05-31 DIAGNOSIS — N2581 Secondary hyperparathyroidism of renal origin: Secondary | ICD-10-CM | POA: Diagnosis not present

## 2018-05-31 DIAGNOSIS — D631 Anemia in chronic kidney disease: Secondary | ICD-10-CM | POA: Diagnosis not present

## 2018-05-31 DIAGNOSIS — D509 Iron deficiency anemia, unspecified: Secondary | ICD-10-CM | POA: Diagnosis not present

## 2018-05-31 NOTE — Assessment & Plan Note (Signed)
Etiology likely environmental exposure with change in soap; he endorses consistency with HD so less likely uremic pruritus. No skin lesions present to suggest infection, scabies, bed bug bites, or calciphylaxis.   Plan: --hydroxyzine 10mg  BID PRN --advised to use sensitive skin, fragrance free soap and detergents

## 2018-05-31 NOTE — Assessment & Plan Note (Addendum)
Split night sleep study performed 8/18.  Recs: Trial of BiPAP therapy on 16/12 cm H2O. Patient used a Medium  size Resmed Full Face Mask AirFit F30 mask and heated humidification.  Plan: --placed DME bipap order

## 2018-05-31 NOTE — Assessment & Plan Note (Signed)
New drainage from previous buttonhole access site; no evidence of surrounding cellulitis or underlying fluctuance. He is already on vancomycin for methicillin resistant staph epidermitis.   Plan: --refer to vascular surgery for management.

## 2018-05-31 NOTE — Assessment & Plan Note (Signed)
Patient has h/o seizure in setting of uremia in June 2019; was initially placed on keppra, however this was discontinued due to thrombocytopenia. In setting of likely uremia causing seizure he was not restarted on an antiepileptic. During July 2019 admission he had another seizure shortly after HD, so not uremic; w/o negative for PRES or CVA at the time. He was started on Vimpat. Clarified administration of Vimpat with patient and his brother. Vimpat 75mg  BID and additional 50mg  after HD session.  He has f/u with neurology next week.

## 2018-05-31 NOTE — Patient Outreach (Signed)
[facility]) Care Management  05/31/2018  Jeffrey [name] [birth year] [id number]   No-show. Home visit scheduled. RNCM called client's brother(caregiver) prior to visit to confirm appointment with no answer. No one was home when [facility] arrived. RNCM called prior to leaving home. No answer. HIPPA compliant message left.  Plan: RNCM will await return call and follw within the next 3-4 business days.  [name], RN, MSN, [place] Coordinator Cell: [phone]

## 2018-05-31 NOTE — Assessment & Plan Note (Signed)
Patient unfortunately missed f/u ERCP appt with [facility] due to hospitalization at [facility]. He denies melena or hematochezia. Brother was told he needs a new referral.  Plan: --referral back to [facility], Dr. Okey Dupre

## 2018-06-01 ENCOUNTER — Encounter: Payer: Self-pay | Admitting: Infectious Diseases

## 2018-06-01 ENCOUNTER — Other Ambulatory Visit: Payer: Medicare Other

## 2018-06-01 ENCOUNTER — Telehealth: Payer: Self-pay | Admitting: *Deleted

## 2018-06-01 DIAGNOSIS — R7881 Bacteremia: Secondary | ICD-10-CM | POA: Diagnosis not present

## 2018-06-01 DIAGNOSIS — I120 Hypertensive chronic kidney disease with stage 5 chronic kidney disease or end stage renal disease: Secondary | ICD-10-CM | POA: Diagnosis not present

## 2018-06-01 DIAGNOSIS — R7989 Other specified abnormal findings of blood chemistry: Secondary | ICD-10-CM | POA: Diagnosis not present

## 2018-06-01 DIAGNOSIS — Z5181 Encounter for therapeutic drug level monitoring: Secondary | ICD-10-CM | POA: Diagnosis not present

## 2018-06-01 DIAGNOSIS — H2512 Age-related nuclear cataract, left eye: Secondary | ICD-10-CM | POA: Diagnosis not present

## 2018-06-01 DIAGNOSIS — R7982 Elevated C-reactive protein (CRP): Secondary | ICD-10-CM | POA: Diagnosis not present

## 2018-06-01 DIAGNOSIS — B957 Other staphylococcus as the cause of diseases classified elsewhere: Secondary | ICD-10-CM | POA: Diagnosis not present

## 2018-06-01 DIAGNOSIS — R700 Elevated erythrocyte sedimentation rate: Secondary | ICD-10-CM | POA: Diagnosis not present

## 2018-06-01 DIAGNOSIS — I330 Acute and subacute infective endocarditis: Secondary | ICD-10-CM | POA: Diagnosis not present

## 2018-06-01 DIAGNOSIS — N186 End stage renal disease: Secondary | ICD-10-CM | POA: Diagnosis not present

## 2018-06-01 DIAGNOSIS — D631 Anemia in chronic kidney disease: Secondary | ICD-10-CM | POA: Diagnosis not present

## 2018-06-01 DIAGNOSIS — H25813 Combined forms of age-related cataract, bilateral: Secondary | ICD-10-CM | POA: Diagnosis not present

## 2018-06-01 LAB — CULTURE, BLOOD (ROUTINE X 2)
CULTURE: NO GROWTH
Culture: NO GROWTH

## 2018-06-01 NOTE — Telephone Encounter (Signed)
Received call from [facility], [place], at [phone]. Per Estill Bamberg, patient has not had a bowel movement since Saturday. RN spoke with patient's partner [name] about this earlier today, they are following up with primary care/nephrology. Landis Gandy, RN

## 2018-06-02 ENCOUNTER — Emergency Department (HOSPITAL_COMMUNITY): Payer: Medicare Other

## 2018-06-02 ENCOUNTER — Emergency Department (HOSPITAL_COMMUNITY)
Admission: EM | Admit: 2018-06-02 | Discharge: 2018-06-02 | Discharge status: Against Medical Advice | Payer: Medicare Other | Attending: Emergency Medicine | Admitting: Emergency Medicine

## 2018-06-02 ENCOUNTER — Other Ambulatory Visit: Payer: Self-pay

## 2018-06-02 DIAGNOSIS — I499 Cardiac arrhythmia, unspecified: Secondary | ICD-10-CM | POA: Diagnosis not present

## 2018-06-02 DIAGNOSIS — R079 Chest pain, unspecified: Secondary | ICD-10-CM | POA: Insufficient documentation

## 2018-06-02 DIAGNOSIS — Z5321 Procedure and treatment not carried out due to patient leaving prior to being seen by health care provider: Secondary | ICD-10-CM | POA: Diagnosis not present

## 2018-06-02 DIAGNOSIS — D631 Anemia in chronic kidney disease: Secondary | ICD-10-CM | POA: Diagnosis not present

## 2018-06-02 DIAGNOSIS — N186 End stage renal disease: Secondary | ICD-10-CM | POA: Diagnosis not present

## 2018-06-02 DIAGNOSIS — R0789 Other chest pain: Secondary | ICD-10-CM | POA: Diagnosis not present

## 2018-06-02 DIAGNOSIS — Z992 Dependence on renal dialysis: Secondary | ICD-10-CM | POA: Diagnosis not present

## 2018-06-02 DIAGNOSIS — S299XXA Unspecified injury of thorax, initial encounter: Secondary | ICD-10-CM | POA: Diagnosis not present

## 2018-06-02 DIAGNOSIS — M5489 Other dorsalgia: Secondary | ICD-10-CM | POA: Diagnosis not present

## 2018-06-02 DIAGNOSIS — D509 Iron deficiency anemia, unspecified: Secondary | ICD-10-CM | POA: Diagnosis not present

## 2018-06-02 DIAGNOSIS — R52 Pain, unspecified: Secondary | ICD-10-CM | POA: Diagnosis not present

## 2018-06-02 DIAGNOSIS — N2581 Secondary hyperparathyroidism of renal origin: Secondary | ICD-10-CM | POA: Diagnosis not present

## 2018-06-02 LAB — I-STAT TROPONIN, ED: Troponin i, poc: 0.04 ng/mL (ref 0.00–0.08)

## 2018-06-02 LAB — BASIC METABOLIC PANEL
Anion gap: 13 (ref 5–15)
BUN: 43 mg/dL — ABNORMAL HIGH (ref 8–23)
CO2: 23 mmol/L (ref 22–32)
Calcium: 8.6 mg/dL — ABNORMAL LOW (ref 8.9–10.3)
Chloride: 98 mmol/L (ref 98–111)
Creatinine, Ser: 9.83 mg/dL — ABNORMAL HIGH (ref 0.61–1.24)
GFR calc Af Amer: 6 mL/min — ABNORMAL LOW (ref >60)
GFR calc non Af Amer: 5 mL/min — ABNORMAL LOW (ref >60)
Glucose, Bld: 111 mg/dL — ABNORMAL HIGH (ref 70–99)
Potassium: 3.1 mmol/L — ABNORMAL LOW (ref 3.5–5.1)
Sodium: 134 mmol/L — ABNORMAL LOW (ref 135–145)

## 2018-06-02 LAB — CBC
HCT: 33.9 % — ABNORMAL LOW (ref 39.0–52.0)
Hemoglobin: 10.5 g/dL — ABNORMAL LOW (ref 13.0–17.0)
MCH: 29.2 pg (ref 26.0–34.0)
MCHC: 31 g/dL (ref 30.0–36.0)
MCV: 94.4 fL (ref 78.0–100.0)
Platelets: 160 10*3/uL (ref 150–400)
RBC: 3.59 MIL/uL — ABNORMAL LOW (ref 4.22–5.81)
RDW: 17.6 % — ABNORMAL HIGH (ref 11.5–15.5)
WBC: 7.1 10*3/uL (ref 4.0–10.5)

## 2018-06-02 NOTE — Progress Notes (Signed)
Internal Medicine Clinic Attending  Case discussed with Dr. Svalina at the time of the visit.  We reviewed the resident's history and exam and pertinent patient test results.  I agree with the assessment, diagnosis, and plan of care documented in the resident's note.  Alexander Raines, M.D., Ph.D.  

## 2018-06-02 NOTE — Patient Outreach (Signed)
[facility]) Care Management  06/02/2018  LIBORIO SACCENTE 1956/09/05 [id number]   62 year old with recent admission 8/11-8/16 with ESRD/HD, Bacteremia, MRSE aortic valve. Attempted home visit earlier this week. Client was not home. Per chart: client presented to the emergency room today with complaint of chest Pain.  RNCM called to follow up. No answer. HIPPA compliant message left.  Plan: send unsuccessful outreach letter and attempt to reach within 3-4 days.  [name], RN, MSN, [place] Coordinator Cell: [phone]

## 2018-06-03 ENCOUNTER — Telehealth: Payer: Self-pay | Admitting: *Deleted

## 2018-06-03 ENCOUNTER — Emergency Department (HOSPITAL_COMMUNITY)
Admission: EM | Admit: 2018-06-03 | Discharge: 2018-06-03 | Disposition: A | Discharge status: Home / Self Care | Payer: Medicare Other | Attending: Emergency Medicine | Admitting: Emergency Medicine

## 2018-06-03 ENCOUNTER — Encounter (HOSPITAL_COMMUNITY): Payer: Self-pay

## 2018-06-03 ENCOUNTER — Other Ambulatory Visit: Payer: Self-pay

## 2018-06-03 DIAGNOSIS — M549 Dorsalgia, unspecified: Secondary | ICD-10-CM | POA: Diagnosis not present

## 2018-06-03 DIAGNOSIS — B957 Other staphylococcus as the cause of diseases classified elsewhere: Secondary | ICD-10-CM | POA: Diagnosis not present

## 2018-06-03 DIAGNOSIS — Z992 Dependence on renal dialysis: Secondary | ICD-10-CM | POA: Diagnosis not present

## 2018-06-03 DIAGNOSIS — Z5321 Procedure and treatment not carried out due to patient leaving prior to being seen by health care provider: Secondary | ICD-10-CM | POA: Diagnosis not present

## 2018-06-03 DIAGNOSIS — D631 Anemia in chronic kidney disease: Secondary | ICD-10-CM | POA: Diagnosis not present

## 2018-06-03 DIAGNOSIS — I330 Acute and subacute infective endocarditis: Secondary | ICD-10-CM | POA: Diagnosis not present

## 2018-06-03 DIAGNOSIS — I120 Hypertensive chronic kidney disease with stage 5 chronic kidney disease or end stage renal disease: Secondary | ICD-10-CM | POA: Diagnosis not present

## 2018-06-03 DIAGNOSIS — D509 Iron deficiency anemia, unspecified: Secondary | ICD-10-CM | POA: Diagnosis not present

## 2018-06-03 DIAGNOSIS — N2581 Secondary hyperparathyroidism of renal origin: Secondary | ICD-10-CM | POA: Diagnosis not present

## 2018-06-03 DIAGNOSIS — N186 End stage renal disease: Secondary | ICD-10-CM | POA: Diagnosis not present

## 2018-06-03 DIAGNOSIS — R7881 Bacteremia: Secondary | ICD-10-CM | POA: Diagnosis not present

## 2018-06-03 NOTE — ED Triage Notes (Addendum)
Pt presents to ED from home for back pain and L knee pain after a fall 2 days ago. Pt reports that he has a seizure disorder, and has been having seizures more frequently. Pt also reports that he is on vanco for heart valve infection. Pt had near syncopal episode in waiting room d/t pain.

## 2018-06-03 NOTE — ED Notes (Signed)
Pt leaving triage room 1. Pt's family reports that the patient does not want to stay any longer. Pt witnessed leaving ED towards parking lot.

## 2018-06-03 NOTE — Telephone Encounter (Signed)
Returned call to Jeffrey [name] about the patient. She left a message that the patient is having back pain 7 out of 10, he is using ice but getting no relief. Advised her will let the doctor know but that he should contact his PCP for this. Advised if Dt Johnnye Sima responds with different advise will give her a call as soon as he does.

## 2018-06-04 ENCOUNTER — Emergency Department (HOSPITAL_COMMUNITY): Payer: Medicare Other

## 2018-06-04 ENCOUNTER — Encounter (HOSPITAL_COMMUNITY): Payer: Self-pay | Admitting: *Deleted

## 2018-06-04 ENCOUNTER — Other Ambulatory Visit: Payer: Self-pay

## 2018-06-04 ENCOUNTER — Emergency Department (HOSPITAL_COMMUNITY)
Admission: EM | Admit: 2018-06-04 | Discharge: 2018-06-04 | Disposition: A | Discharge status: Home / Self Care | Payer: Medicare Other | Attending: Emergency Medicine | Admitting: Emergency Medicine

## 2018-06-04 DIAGNOSIS — S3992XA Unspecified injury of lower back, initial encounter: Secondary | ICD-10-CM | POA: Diagnosis not present

## 2018-06-04 DIAGNOSIS — Z79899 Other long term (current) drug therapy: Secondary | ICD-10-CM | POA: Diagnosis not present

## 2018-06-04 DIAGNOSIS — I2510 Atherosclerotic heart disease of native coronary artery without angina pectoris: Secondary | ICD-10-CM | POA: Diagnosis not present

## 2018-06-04 DIAGNOSIS — Z992 Dependence on renal dialysis: Secondary | ICD-10-CM | POA: Diagnosis not present

## 2018-06-04 DIAGNOSIS — S0990XA Unspecified injury of head, initial encounter: Secondary | ICD-10-CM | POA: Diagnosis not present

## 2018-06-04 DIAGNOSIS — Y9301 Activity, walking, marching and hiking: Secondary | ICD-10-CM | POA: Insufficient documentation

## 2018-06-04 DIAGNOSIS — R569 Unspecified convulsions: Secondary | ICD-10-CM | POA: Diagnosis not present

## 2018-06-04 DIAGNOSIS — N186 End stage renal disease: Secondary | ICD-10-CM | POA: Diagnosis not present

## 2018-06-04 DIAGNOSIS — Z87891 Personal history of nicotine dependence: Secondary | ICD-10-CM | POA: Diagnosis not present

## 2018-06-04 DIAGNOSIS — Y929 Unspecified place or not applicable: Secondary | ICD-10-CM | POA: Diagnosis not present

## 2018-06-04 DIAGNOSIS — W19XXXA Unspecified fall, initial encounter: Secondary | ICD-10-CM

## 2018-06-04 DIAGNOSIS — R51 Headache: Secondary | ICD-10-CM | POA: Diagnosis not present

## 2018-06-04 DIAGNOSIS — I120 Hypertensive chronic kidney disease with stage 5 chronic kidney disease or end stage renal disease: Secondary | ICD-10-CM | POA: Insufficient documentation

## 2018-06-04 DIAGNOSIS — J449 Chronic obstructive pulmonary disease, unspecified: Secondary | ICD-10-CM | POA: Diagnosis not present

## 2018-06-04 DIAGNOSIS — S39012A Strain of muscle, fascia and tendon of lower back, initial encounter: Secondary | ICD-10-CM

## 2018-06-04 DIAGNOSIS — M546 Pain in thoracic spine: Secondary | ICD-10-CM | POA: Diagnosis not present

## 2018-06-04 DIAGNOSIS — Y999 Unspecified external cause status: Secondary | ICD-10-CM | POA: Insufficient documentation

## 2018-06-04 DIAGNOSIS — S299XXA Unspecified injury of thorax, initial encounter: Secondary | ICD-10-CM | POA: Diagnosis not present

## 2018-06-04 LAB — CBC WITH DIFFERENTIAL/PLATELET
ABS IMMATURE GRANULOCYTES: 0 10*3/uL (ref 0.0–0.1)
Basophils Absolute: 0.1 10*3/uL (ref 0.0–0.1)
Basophils Relative: 1 %
Eosinophils Absolute: 1.3 10*3/uL — ABNORMAL HIGH (ref 0.0–0.7)
Eosinophils Relative: 18 %
HCT: 33.7 % — ABNORMAL LOW (ref 39.0–52.0)
HEMOGLOBIN: 10.3 g/dL — AB (ref 13.0–17.0)
Immature Granulocytes: 0 %
LYMPHS PCT: 13 %
Lymphs Abs: 0.9 10*3/uL (ref 0.7–4.0)
MCH: 29 pg (ref 26.0–34.0)
MCHC: 30.6 g/dL (ref 30.0–36.0)
MCV: 94.9 fL (ref 78.0–100.0)
MONO ABS: 0.7 10*3/uL (ref 0.1–1.0)
MONOS PCT: 10 %
NEUTROS ABS: 4.1 10*3/uL (ref 1.7–7.7)
Neutrophils Relative %: 58 %
Platelets: 148 10*3/uL — ABNORMAL LOW (ref 150–400)
RBC: 3.55 MIL/uL — ABNORMAL LOW (ref 4.22–5.81)
RDW: 17.4 % — ABNORMAL HIGH (ref 11.5–15.5)
WBC: 7.2 10*3/uL (ref 4.0–10.5)

## 2018-06-04 LAB — COMPREHENSIVE METABOLIC PANEL
ALK PHOS: 82 U/L (ref 38–126)
ALT: 9 U/L (ref 0–44)
AST: 17 U/L (ref 15–41)
Albumin: 3.3 g/dL — ABNORMAL LOW (ref 3.5–5.0)
Anion gap: 12 (ref 5–15)
BILIRUBIN TOTAL: 0.8 mg/dL (ref 0.3–1.2)
BUN: 40 mg/dL — ABNORMAL HIGH (ref 8–23)
CALCIUM: 8.6 mg/dL — AB (ref 8.9–10.3)
CO2: 24 mmol/L (ref 22–32)
Chloride: 97 mmol/L — ABNORMAL LOW (ref 98–111)
Creatinine, Ser: 10.3 mg/dL — ABNORMAL HIGH (ref 0.61–1.24)
GFR, EST AFRICAN AMERICAN: 5 mL/min — AB (ref >60)
GFR, EST NON AFRICAN AMERICAN: 5 mL/min — AB (ref >60)
GLUCOSE: 125 mg/dL — AB (ref 70–99)
Potassium: 3.3 mmol/L — ABNORMAL LOW (ref 3.5–5.1)
SODIUM: 133 mmol/L — AB (ref 135–145)
Total Protein: 6.8 g/dL (ref 6.5–8.1)

## 2018-06-04 LAB — I-STAT CG4 LACTIC ACID, ED: Lactic Acid, Venous: 1.48 mmol/L (ref 0.5–1.9)

## 2018-06-04 LAB — I-STAT TROPONIN, ED
TROPONIN I, POC: 0.02 ng/mL (ref 0.00–0.08)
TROPONIN I, POC: 0.02 ng/mL (ref 0.00–0.08)

## 2018-06-04 LAB — LIPASE, BLOOD: Lipase: 22 U/L (ref 11–51)

## 2018-06-04 MED ORDER — OXYCODONE-ACETAMINOPHEN 5-325 MG PO TABS: 1.0000 | ORAL_TABLET | ORAL | 0 refills | Status: DC | PRN

## 2018-06-04 MED ORDER — HYDROMORPHONE HCL 1 MG/ML IJ SOLN
1.0000 mg | Freq: Once | INTRAMUSCULAR | Status: AC
  Administered 2018-06-04: 1 mg via INTRAVENOUS
  Filled 2018-06-04: qty 1

## 2018-06-04 MED ORDER — HYDROMORPHONE HCL 1 MG/ML IJ SOLN
0.5000 mg | Freq: Once | INTRAMUSCULAR | Status: AC
  Administered 2018-06-04: 0.5 mg via INTRAVENOUS
  Filled 2018-06-04: qty 1

## 2018-06-04 NOTE — ED Provider Notes (Signed)
[facility] EMERGENCY DEPARTMENT Provider Note   CSN: [id number] Arrival date & time: 06/04/18  4801     History   Chief Complaint Chief Complaint  Patient presents with  . Fall    HPI Jeffrey [name] is a 62 y.o. male with past nuchal history significant for end-stage renal disease on hemodialysis, HIV infection, hypertension, seizures, current treatment for endocarditis on Vanc who presents for evaluation of midline lower back pain.  Patient states he fell approximately 4 days ago on his way to the doctor's office.  She states he felt fine initially after the fall however 12-24 hrs. later he developed severe midline back pain.  Patient states his back pain radiates down bilateral legs and across to his abdomen.  Pain is rated a 9/ 10.  Describes his pain as a sharp shooting pain.  Pain does not go away with lying flat.  Denies aggravating or alleviating factors. Denies fever, chills, headache, numbness, tingling, chest pain, shortness of breath, diarrhea, constipation, cough, hemoptysis, melena, hematochezia  HPI  Past Medical History:  Diagnosis Date  . Anemia   . Anxiety   . Arthritis   . Asthma    per pt hx  . End stage renal disease on home HD 07/10/2011   Started HD in September 2012 at [facility] with a tunneled HD catheter, now on home HD with [facility]. Dialyzing through AVF L lower arm with buttonhole technique as of mid 2014. His brother does the HD treatments at home.  They are roommates for 23 years.  The brother works 3rd shift and gets off about 8am and then puts Mr Kinn on HD in the morning after getting home. Most of the time he does HD about 4 times a week, for about 4 hours per treatment. Cause of ESRD was HTN according to patient. He says he let his health go and ending up with complications, and that he didn't like seeing doctors in those days.  He says he was diagnosed with severe HTN when he lived in [place] in his 30's.   . Hepatitis B carrier ([place])    . HIV infection ([place])   . Hypertension   . Hyperthyroidism    normal now  . Pneumonia several yrs ago  . Seizure ([place])   . Seizures ([place]) 06/02/2011   x 1 none since  . Thrombocytopenia [facility])     Patient Active Problem List   Diagnosis Date Noted  . OSA (obstructive sleep apnea) 05/31/2018  . Onychomycosis of right great toe 05/09/2018  . 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 Acute bacterial endocarditis   . Left shoulder pain   . 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 Coagulase negative Staphylococcus bacteremia   . Hypervolemia   . ESRD on hemodialysis ([place])   . Asymptomatic HIV infection ([place])   . CAD (coronary artery disease) 04/19/2018  . Dyspnea 04/10/2018  . Loud snoring 04/08/2018  . Upper GI bleed 04/08/2018  . Malnutrition of moderate degree 02/23/2018  . Seizure ([place]) 02/22/2018  . Mass of duodenum   . Antibiotic long-term use   . Septic arthritis ([place]) 09/15/2017  . Encounter for incision and drainage procedure 09/15/2017  . Effusion of left knee 09/08/2017  . Intertrigo 07/09/2017  . AC joint arthropathy 06/16/2017  . Pseudoaneurysm of arteriovenous dialysis fistula ([place]) 12/23/2016  . Eczema 12/10/2016  . Bilateral low back pain without sciatica   . Aortic valve insufficiency   . Fall 09/08/2016  .  Knee abrasion, right, initial encounter 09/08/2016  . Lumbosacral spondylosis without myelopathy 08/03/2016  . Pulmonary nodules 08/03/2016  . Essential hypertension   . Anemia of chronic disease 04/08/2016  . Secondary hyperparathyroidism ([place]) 02/09/2014  . Asthma 02/09/2014  . Nuclear sclerotic cataract of both eyes 12/12/2013  . Pruritus 10/26/2013  . Myopia with presbyopia of both eyes 10/26/2013  . COPD (chronic obstructive pulmonary disease) ([place]) 06/27/2013  . Thrombocytopenia ([place]) 01/09/2013  . HBV (hepatitis B virus) infection 01/09/2013  . Anxiety 07/22/2011  . End stage renal disease on home HD 07/10/2011  .  HIV disease ([place]) 06/17/2011    Past Surgical History:  Procedure Laterality Date  . AV FISTULA PLACEMENT  06/02/11   Left radiocephalic AVF  . BIOPSY  02/17/2018   Procedure: BIOPSY;  Surgeon: Milus Banister, MD;  Location: [facility] ENDOSCOPY;  Service: Endoscopy;;  . COLONOSCOPY    . COLONOSCOPY WITH PROPOFOL N/A 01/27/2018   Procedure: COLONOSCOPY WITH PROPOFOL;  Surgeon: [name], MD;  Location: [facility] ENDOSCOPY;  Service: Gastroenterology;  Laterality: N/A;  . CORONARY ANGIOPLASTY    . ERCP  03/21/2018   AT [facility]  . ESOPHAGOGASTRODUODENOSCOPY N/A 02/17/2018   Procedure: ESOPHAGOGASTRODUODENOSCOPY (EGD);  Surgeon: Milus Banister, MD;  Location: [facility] ENDOSCOPY;  Service: Endoscopy;  Laterality: N/A;  . ESOPHAGOGASTRODUODENOSCOPY (EGD) WITH PROPOFOL N/A 01/27/2018   Procedure: ESOPHAGOGASTRODUODENOSCOPY (EGD) WITH PROPOFOL;  Surgeon: [name], MD;  Location: [facility] ENDOSCOPY;  Service: Gastroenterology;  Laterality: N/A;  . ESOPHAGOGASTRODUODENOSCOPY (EGD) WITH PROPOFOL N/A 04/03/2018   Procedure: ESOPHAGOGASTRODUODENOSCOPY (EGD) WITH PROPOFOL;  Surgeon: Clarene Essex, MD;  Location: [facility];  Service: Endoscopy;  Laterality: N/A;  . EUS N/A 02/17/2018   Procedure: UPPER ENDOSCOPIC ULTRASOUND (EUS) RADIAL;  Surgeon: Milus Banister, MD;  Location: [facility] ENDOSCOPY;  Service: Endoscopy;  Laterality: N/A;  . fistulaogram     x 2 last 2 years  . IR DIALY SHUNT INTRO NEEDLE/INTRACATH INITIAL W/IMG LEFT Left 04/20/2018  . IR FLUORO GUIDE CV LINE RIGHT  09/16/2017  . IR US GUIDE VASC ACCESS RIGHT  09/16/2017  . IRRIGATION AND DEBRIDEMENT KNEE Left 09/15/2017   Procedure: IRRIGATION AND DEBRIDEMENT KNEE; arthroscopic clean out;  Surgeon: [name], MD;  Location: [facility] ORS;  Service: Orthopedics;  Laterality: Left;  . OTHER SURGICAL HISTORY     removal temporary HD catheter   . REVISON OF ARTERIOVENOUS FISTULA Left 10/10/2015   Procedure: REVISON OF LEFT RADIOCEPHALIC ARTERIOVENOUS FISTULA;   Surgeon: Angelia Mould, MD;  Location: [facility];  Service: Vascular;  Laterality: Left;  . REVISON OF ARTERIOVENOUS FISTULA Left 02/07/2016   Procedure: REPAIR OF PSEUDO-ANEUREYSM OF LEFT ARM  ARTERIOVENOUS FISTULA;  Surgeon: Angelia Mould, MD;  Location: [facility];  Service: Vascular;  Laterality: Left;  . REVISON OF ARTERIOVENOUS FISTULA Left 0/04/6760   Procedure: PLICATION OF LEFT ARM RADIOCEPHALIC ARTERIOVENOUS FISTULA PSEUDOANEURYSM;  Surgeon: Angelia Mould, MD;  Location: [facility];  Service: Vascular;  Laterality: Left;  . RIGHT/LEFT HEART CATH AND CORONARY ANGIOGRAPHY N/A 02/17/2017   Procedure: Right/Left Heart Cath and Coronary Angiography;  Surgeon: Sherren Mocha, MD;  Location: [facility] CV LAB;  Service: Cardiovascular;  Laterality: N/A;  . TEE WITHOUT CARDIOVERSION N/A 09/11/2016   Procedure: TRANSESOPHAGEAL ECHOCARDIOGRAM (TEE);  Surgeon: Dorothy Spark, MD;  Location: [facility];  Service: Cardiovascular;  Laterality: N/A;  . TEE WITHOUT CARDIOVERSION N/A 05/05/2018   Procedure: TRANSESOPHAGEAL ECHOCARDIOGRAM (TEE);  Surgeon: Adrian Prows, MD;  Location: [facility];  Service: Cardiovascular;  Laterality: N/A;  Prefer after 3:30 PM        Home Medications    Prior to Admission medications   Medication Sig Start Date End Date Taking? Authorizing Provider  albuterol (PROVENTIL) (2.5 MG/3ML) 0.083% nebulizer solution Take 2.5 mg by nebulization every 6 (six) hours as needed for wheezing or shortness of breath.    [provider]  bictegravir-emtricitabine-tenofovir AF (BIKTARVY) 50-200-25 MG TABS tablet Take 1 tablet by mouth daily. 12/27/17   Campbell Riches, MD  calcium acetate (PHOSLO) 667 MG capsule Take 667-2,001 mg by mouth See admin instructions. Take 1,334 mg three times a day with each meal and 667 mg with each snack 06/06/15   [provider]  carvedilol (COREG) 25 MG tablet Take 1 tablet (25 mg total) by mouth 2 (two) times daily  with a meal. 04/04/18   Alphonzo Grieve, MD  cloNIDine (CATAPRES - DOSED IN MG/24 HR) 0.2 mg/24hr patch Place 1 patch (0.2 mg total) onto the skin once a week. 04/19/18   Richardson Dopp T, PA-C  clotrimazole-betamethasone (LOTRISONE) cream Apply 1 application topically 2 (two) times daily. 09/08/17   Campbell Riches, MD  epoetin alfa (EPOGEN,PROCRIT) 2000 UNIT/ML injection Inject 2,000 Units into the vein 3 (three) times a week.     [provider]  ethyl chloride spray Apply 1 application topically daily as needed (HD- to numb).  05/20/15   [provider]  heparin 1000 UNIT/ML injection Inject 3,000 Units into the vein 4 (four) times a week.     [provider]  hydrALAZINE (APRESOLINE) 100 MG tablet Take 1 tablet (100 mg total) by mouth 3 (three) times daily. 04/14/18   Velna Ochs, MD  HYDROcodone-acetaminophen (NORCO/VICODIN) 5-325 MG tablet Take 1 tablet by mouth every 6 (six) hours as needed. 05/09/18   Jean Rosenthal, MD  hydrOXYzine (ATARAX/VISTARIL) 10 MG tablet Take 1 tablet (10 mg total) by mouth 2 (two) times daily as needed for itching. 05/30/18   Alphonzo Grieve, MD  Ipratropium-Albuterol (COMBIVENT IN) Inhale 2 puffs into the lungs every 6 (six) hours as needed (as needed for shortness of breath).     [provider]  iron sucrose (VENOFER) 20 MG/ML injection Inject 100 mg into the vein every Monday.     [provider]  lacosamide (VIMPAT) 50 MG TABS tablet Take 1.5 tablets (75 mg total) by mouth 2 (two) times daily. 05/09/18   Bartholomew Crews, MD  lacosamide (VIMPAT) 50 MG TABS tablet Take one half pill (25 mg) after hemodialysis 05/09/18   Bartholomew Crews, MD  megestrol (MEGACE) 40 MG tablet Take 2 tablets (80 mg total) by mouth 2 (two) times daily. Patient taking differently: Take 40 mg by mouth daily.  02/28/18   Kathi Ludwig, MD  multivitamin (RENA-VIT) TABS tablet Take 1 tablet by mouth daily.      [provider]  omeprazole (PRILOSEC) 20 MG capsule Take 1 capsule (20 mg total) by mouth daily. 01/31/18   [name], MD  traZODone (DESYREL) 50 MG tablet TAKE 1 TABLET(50 MG) BY MOUTH AT BEDTIME AS NEEDED FOR SLEEP 05/26/18   Campbell Riches, MD  vancomycin IVPB Inject 750 mg into the vein every dialysis. Indication: MRSE bacteremia/endocarditis Last Day of Therapy:  06/13/18 Labs - Sunday/Monday:  CBC/D, BMP, and vancomycin trough. Labs - Thursday:  BMP and vancomycin trough Labs - Every other week:  ESR and CRP Please make sure this is administered after dialysis on dialysis  days. 05/06/18 06/13/18  Oval Linsey, MD  VOLTAREN 1 % GEL APPLY 2 GRAMS EXTERNALLY TO THE AFFECTED AREA FOUR TIMES DAILY 05/09/18   Jean Rosenthal, MD    Family History Family History  Problem Relation Age of Onset  . Hypertension Mother   . Diabetes Mother   . Cancer Father     Social History Social History   Tobacco Use  . Smoking status: Former Smoker    Years: 10.00    Types: Cigarettes    Last attempt to quit: 08/08/2015    Years since quitting: 2.8  . Smokeless tobacco: Never Used  . Tobacco comment: casual smoking for 10 years  Substance Use Topics  . Alcohol use: No    Alcohol/week: 0.0 standard drinks    Comment: quit 2004  . Drug use: No    Comment: uds (+) cocaine in 08/2011 but pt states was taking sudafed at the time     Allergies   Lisinopril; Penicillins; Fentanyl; and Morphine and related   Review of Systems Review of Systems  Systems negative unless otherwise stated in the HPI Physical Exam Updated Vital Signs BP (!) 173/71 (BP Location: Right Arm)   Pulse 62   Temp 98 F (36.7 C) (Oral)   Resp 20   SpO2 100%   Physical Exam  Constitutional: He appears well-developed and well-nourished. No distress.  HENT:  Head: Normocephalic and atraumatic.  Eyes: Pupils are equal, round, and reactive to light.  Neck: Normal range of motion. Neck supple.  Full ROM without pain     Cardiovascular: Normal rate, regular rhythm, normal heart sounds and intact distal pulses. Exam reveals no gallop and no friction rub.  No murmur heard. Pulmonary/Chest: Effort normal and breath sounds normal. No stridor. No respiratory distress. He has no wheezes. He has no rales. He exhibits no tenderness.  Abdominal: Soft. Bowel sounds are normal. He exhibits no distension and no mass. There is no tenderness. There is no rebound and no guarding. No hernia.  Musculoskeletal: Normal range of motion.  Full range of motion of the T-spine and L-spine with flexion, hyperextension, and lateral flexion. No stepoffs. Mildline tenderness to patient of the thoracic and lumbar spine. Mild tenderness to palpation of the paraspinous muscles of the L-spine.  Native straight leg raise.  Neurological: He is alert. He has normal strength. No sensory deficit.  Reflex Scores:      Patellar reflexes are 2+ on the right side and 2+ on the left side.      Achilles reflexes are 2+ on the right side and 2+ on the left side.  Speech is clear and goal oriented, follows commands Normal 5/5 strength in upper and lower extremities bilaterally including dorsiflexion and plantar flexion, strong and equal grip strength Sensation normal to light and sharp touch Moves extremities without ataxia, coordination intact  Unable assess gait.  Skin: Skin is warm and dry. He is not diaphoretic.  Psychiatric: He has a normal mood and affect.  Nursing note and vitals reviewed.   ED Treatments / Results  Labs (all labs ordered are listed, but only abnormal results are displayed) Labs Reviewed  CBC WITH DIFFERENTIAL/PLATELET  COMPREHENSIVE METABOLIC PANEL  URINALYSIS, ROUTINE W REFLEX MICROSCOPIC    EKG None  Radiology No results found.  Procedures Procedures (including critical care time)  Medications Ordered in ED Medications - No data to display   Initial Impression / Assessment and Plan / ED Course  I have  reviewed the triage  vital signs and the nursing notes past medical history  Pertinent labs & imaging results that were available during my care of the patient were reviewed by me and considered in my medical decision making (see chart for details).  62 year old presents for evaluation of midline back pain after fall. Current hemodialysis patient and currently treated for endocarditis on vancomycin.  Tenderness to palpation of thoracic and lumbar spine.  Radiation of pain down bilateral legs and across stomach.  Given history and physical exam, concern for lumbar infection versus musculoskeletal injury.  Will obtain labs, urine, MRI and reassess.  1010: Notified by nursing staff that patient had a seizure.  Discussed with Dr. Sherry Ruffing my attending who agrees to assume care of patient.    Final Clinical Impressions(s) / ED Diagnoses   Final diagnoses:  None    ED Discharge Orders    None       Candyce Gambino A, PA-C 06/04/18 1020    Tegeler, Gwenyth Allegra, MD 06/04/18 [phone]

## 2018-06-04 NOTE — ED Notes (Signed)
Pain travels from back around to stomach.  No blood in urine.

## 2018-06-04 NOTE — ED Triage Notes (Signed)
Pt in after a fall on Tuesday leaving the doctors office, c/o continued pain back pain since that time, pt was seen yesterday for same and had xray but left before result

## 2018-06-04 NOTE — ED Notes (Addendum)
Pt observed to have a seizured by visitor and Dance movement psychotherapist. This RN in room, pt becoming more alert. Hypertensive 170/98. Starting to follow commands. IV access obtained and blood drawn. Pt to be moved to D31. Pt moving all extremities. No oral trauma or incontinence noted.

## 2018-06-04 NOTE — Discharge Instructions (Signed)
Your work-up and imaging today showed evidence of a paraspinal back muscle strain from the fall.  They did not see evidence of fracture or dislocation.  No evidence of worsened infection.  Given reassuring work-up and improvement in pain after medications, we feel you are safe for discharge home.  Please follow-up with your primary care physician for further management.  If any symptoms change or worsen, please return to the nearest emergency department.

## 2018-06-04 NOTE — ED Notes (Signed)
Pt family member approaches lobby tech to ask for medicine for HA, triage RN aware

## 2018-06-04 NOTE — ED Notes (Signed)
Pt notified about needing a urine specimen. Pt stated that he does not make his own urine when asked.

## 2018-06-04 NOTE — ED Provider Notes (Signed)
[place] EMERGENCY DEPARTMENT Provider Note   CSN: [id number] Arrival date & time: 06/04/18  8295     History   Chief Complaint Chief Complaint  Patient presents with  . Fall    HPI Jeffrey [name] is a 62 y.o. male.  The history is provided by the patient, a relative and medical records. No language interpreter was used.  Fall  This is a new problem. The current episode started more than 2 days ago. The problem occurs rarely. The problem has not changed since onset.Associated symptoms include headaches. Pertinent negatives include no chest pain, no abdominal pain and no shortness of breath. Nothing aggravates the symptoms. Nothing relieves the symptoms. He has tried nothing for the symptoms. The treatment provided no relief.  Back Pain   This is a new problem. The current episode started 2 days ago. The problem occurs constantly. The problem has been gradually worsening. The pain is associated with falling. The pain is present in the lumbar spine and thoracic spine. The quality of the pain is described as aching and stabbing. The pain does not radiate. The pain is at a severity of 9/10. The pain is severe. Associated symptoms include headaches. Pertinent negatives include no chest pain, no fever, no numbness, no abdominal pain, no abdominal swelling, no dysuria, no pelvic pain, no leg pain, no paresthesias, no paresis, no tingling and no weakness. He has tried nothing for the symptoms. The treatment provided no relief.    Past Medical History:  Diagnosis Date  . Anemia   . Anxiety   . Arthritis   . Asthma    per pt hx  . End stage renal disease on home HD 07/10/2011   Started HD in September 2012 at [facility] with a tunneled HD catheter, now on home HD with NxtStage. Dialyzing through AVF L lower arm with buttonhole technique as of mid 2014. His brother does the HD treatments at home.  They are roommates for 23 years.  The brother works 3rd shift and gets off about  8am and then puts Jeffrey Usery on HD in the morning after getting home. Most of the time he does HD about 4 times a week, for about 4 hours per treatment. Cause of ESRD was HTN according to patient. He says he let his health go and ending up with complications, and that he didn't like seeing doctors in those days.  He says he was diagnosed with severe HTN when he lived in [place] in his 54's.   . Hepatitis B carrier ([place])   . HIV infection ([place])   . Hypertension   . Hyperthyroidism    normal now  . Pneumonia several yrs ago  . Seizure ([place])   . Seizures ([place]) 06/02/2011   x 1 none since  . Thrombocytopenia [facility])     Patient Active Problem List   Diagnosis Date Noted  . OSA (obstructive sleep apnea) 05/31/2018  . Onychomycosis of right great toe 05/09/2018  . 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 Acute bacterial endocarditis   . Left shoulder pain   . 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 Coagulase negative Staphylococcus bacteremia   . Hypervolemia   . ESRD on hemodialysis ([place])   . Asymptomatic HIV infection ([place])   . CAD (coronary artery disease) 04/19/2018  . Dyspnea 04/10/2018  . Loud snoring 04/08/2018  . Upper GI bleed 04/08/2018  . Malnutrition of moderate degree 02/23/2018  . Seizure ([place]) 02/22/2018  .  Mass of duodenum   . Antibiotic long-term use   . Septic arthritis ([place]) 09/15/2017  . Encounter for incision and drainage procedure 09/15/2017  . Effusion of left knee 09/08/2017  . Intertrigo 07/09/2017  . AC joint arthropathy 06/16/2017  . Pseudoaneurysm of arteriovenous dialysis fistula ([place]) 12/23/2016  . Eczema 12/10/2016  . Bilateral low back pain without sciatica   . Aortic valve insufficiency   . Fall 09/08/2016  . Knee abrasion, right, initial encounter 09/08/2016  . Lumbosacral spondylosis without myelopathy 08/03/2016  . Pulmonary nodules 08/03/2016  . Essential hypertension   . Anemia of chronic disease 04/08/2016  .  Secondary hyperparathyroidism ([place]) 02/09/2014  . Asthma 02/09/2014  . Nuclear sclerotic cataract of both eyes 12/12/2013  . Pruritus 10/26/2013  . Myopia with presbyopia of both eyes 10/26/2013  . COPD (chronic obstructive pulmonary disease) ([place]) 06/27/2013  . Thrombocytopenia ([place]) 01/09/2013  . HBV (hepatitis B virus) infection 01/09/2013  . Anxiety 07/22/2011  . End stage renal disease on home HD 07/10/2011  . HIV disease ([place]) 06/17/2011    Past Surgical History:  Procedure Laterality Date  . AV FISTULA PLACEMENT  06/02/11   Left radiocephalic AVF  . BIOPSY  02/17/2018   Procedure: BIOPSY;  Surgeon: Milus Banister, MD;  Location: [facility] ENDOSCOPY;  Service: Endoscopy;;  . COLONOSCOPY    . COLONOSCOPY WITH PROPOFOL N/A 01/27/2018   Procedure: COLONOSCOPY WITH PROPOFOL;  Surgeon: [name], MD;  Location: [facility] ENDOSCOPY;  Service: Gastroenterology;  Laterality: N/A;  . CORONARY ANGIOPLASTY    . ERCP  03/21/2018   AT [place]  . ESOPHAGOGASTRODUODENOSCOPY N/A 02/17/2018   Procedure: ESOPHAGOGASTRODUODENOSCOPY (EGD);  Surgeon: Milus Banister, MD;  Location: [facility] ENDOSCOPY;  Service: Endoscopy;  Laterality: N/A;  . ESOPHAGOGASTRODUODENOSCOPY (EGD) WITH PROPOFOL N/A 01/27/2018   Procedure: ESOPHAGOGASTRODUODENOSCOPY (EGD) WITH PROPOFOL;  Surgeon: [name], MD;  Location: [facility] END[facility]COPY;  Service: Gastroenterology;  Laterality: N/A;  . ESOPHAGOGASTRODUODENOSCOPY (EGD) WITH PROPOFOL N/A 04/03/2018   Procedure: ESOPHAGOGASTRODUODENOSCOPY (EGD) WITH PROPOFOL;  Surgeon: Clarene Essex, MD;  Location: [place];  Service: Endoscopy;  Laterality: N/A;  . EUS N/A 02/17/2018   Procedure: UPPER ENDOSCOPIC ULTRASOUND (EUS) RADIAL;  Surgeon: Milus Banister, MD;  Location: [facility] ENDOSCOPY;  Service: Endoscopy;  Laterality: N/A;  . fistulaogram     x 2 last 2 years  . IR DIALY SHUNT INTRO NEEDLE/INTRACATH INITIAL W/IMG LEFT Left 04/20/2018  . IR FLUORO GUIDE CV LINE RIGHT  09/16/2017  . IR US  GUIDE VASC ACCESS RIGHT  09/16/2017  . IRRIGATION AND DEBRIDEMENT KNEE Left 09/15/2017   Procedure: IRRIGATION AND DEBRIDEMENT KNEE; arthroscopic clean out;  Surgeon: [name], MD;  Location: [facility] ORS;  Service: Orthopedics;  Laterality: Left;  . OTHER SURGICAL HISTORY     removal temporary HD catheter   . REVISON OF ARTERIOVENOUS FISTULA Left 10/10/2015   Procedure: REVISON OF LEFT RADIOCEPHALIC ARTERIOVENOUS FISTULA;  Surgeon: Angelia Mould, MD;  Location: [place];  Service: Vascular;  Laterality: Left;  . REVISON OF ARTERIOVENOUS FISTULA Left 02/07/2016   Procedure: REPAIR OF PSEUDO-ANEUREYSM OF LEFT ARM  ARTERIOVENOUS FISTULA;  Surgeon: Angelia Mould, MD;  Location: [place];  Service: Vascular;  Laterality: Left;  . REVISON OF ARTERIOVENOUS FISTULA Left 2/77/4128   Procedure: PLICATION OF LEFT ARM RADIOCEPHALIC ARTERIOVENOUS FISTULA PSEUDOANEURYSM;  Surgeon: Angelia Mould, MD;  Location: [place];  Service: Vascular;  Laterality: Left;  . RIGHT/LEFT HEART CATH AND CORONARY ANGIOGRAPHY N/A 02/17/2017   Procedure: Right/Left Heart  Cath and Coronary Angiography;  Surgeon: Sherren Mocha, MD;  Location: [facility];  Service: Cardiovascular;  Laterality: N/A;  . TEE WITHOUT CARDIOVERSION N/A 09/11/2016   Procedure: TRANSESOPHAGEAL ECHOCARDIOGRAM (TEE);  Surgeon: Dorothy Spark, MD;  Location: [place];  Service: Cardiovascular;  Laterality: N/A;  . TEE WITHOUT CARDIOVERSION N/A 05/05/2018   Procedure: TRANSESOPHAGEAL ECHOCARDIOGRAM (TEE);  Surgeon: Adrian Prows, MD;  Location: [place];  Service: Cardiovascular;  Laterality: N/A;  Prefer after 3:30 PM        Home Medications    Prior to Admission medications   Medication Sig Start Date End Date Taking? Authorizing Provider  albuterol (PROVENTIL) (2.5 MG/3ML) 0.083% nebulizer solution Take 2.5 mg by nebulization every 6 (six) hours as needed for wheezing or shortness of breath.    [provider]    bictegravir-emtricitabine-tenofovir AF (BIKTARVY) 50-200-25 MG TABS tablet Take 1 tablet by mouth daily. 12/27/17   Campbell Riches, MD  calcium acetate (PHOSLO) 667 MG capsule Take 667-2,001 mg by mouth See admin instructions. Take 1,334 mg three times a day with each meal and 667 mg with each snack 06/06/15   [provider]  carvedilol (COREG) 25 MG tablet Take 1 tablet (25 mg total) by mouth 2 (two) times daily with a meal. 04/04/18   Alphonzo Grieve, MD  cloNIDine (CATAPRES - DOSED IN MG/24 HR) 0.2 mg/24hr patch Place 1 patch (0.2 mg total) onto the skin once a week. 04/19/18   Richardson Dopp T, PA-C  clotrimazole-betamethasone (LOTRISONE) cream Apply 1 application topically 2 (two) times daily. 09/08/17   Campbell Riches, MD  epoetin alfa (EPOGEN,PROCRIT) 2000 UNIT/ML injection Inject 2,000 Units into the vein 3 (three) times a week.     [provider]  ethyl chloride spray Apply 1 application topically daily as needed (HD- to numb).  05/20/15   [provider]  heparin 1000 UNIT/ML injection Inject 3,000 Units into the vein 4 (four) times a week.     [provider]  hydrALAZINE (APRESOLINE) 100 MG tablet Take 1 tablet (100 mg total) by mouth 3 (three) times daily. 04/14/18   Velna Ochs, MD  HYDROcodone-acetaminophen (NORCO/VICODIN) 5-325 MG tablet Take 1 tablet by mouth every 6 (six) hours as needed. 05/09/18   Jean Rosenthal, MD  hydrOXYzine (ATARAX/VISTARIL) 10 MG tablet Take 1 tablet (10 mg total) by mouth 2 (two) times daily as needed for itching. 05/30/18   Alphonzo Grieve, MD  Ipratropium-Albuterol (COMBIVENT IN) Inhale 2 puffs into the lungs every 6 (six) hours as needed (as needed for shortness of breath).     [provider]  iron sucrose (VENOFER) 20 MG/ML injection Inject 100 mg into the vein every Monday.     [provider]  lacosamide (VIMPAT) 50 MG TABS tablet Take 1.5 tablets (75 mg total) by mouth 2 (two) times daily.  05/09/18   Bartholomew Crews, MD  lacosamide (VIMPAT) 50 MG TABS tablet Take one half pill (25 mg) after hemodialysis 05/09/18   Bartholomew Crews, MD  megestrol (MEGACE) 40 MG tablet Take 2 tablets (80 mg total) by mouth 2 (two) times daily. Patient taking differently: Take 40 mg by mouth daily.  02/28/18   Kathi Ludwig, MD  multivitamin (RENA-VIT) TABS tablet Take 1 tablet by mouth daily.      [provider]  omeprazole (PRILOSEC) 20 MG capsule Take 1 capsule (20 mg total) by mouth daily. 01/31/18   [name], MD  traZODone (DESYREL) 50  MG tablet TAKE 1 TABLET(50 MG) BY MOUTH AT BEDTIME AS NEEDED FOR SLEEP 05/26/18   Campbell Riches, MD  vancomycin IVPB Inject 750 mg into the vein every dialysis. Indication: MRSE bacteremia/endocarditis Last Day of Therapy:  06/13/18 Labs - Sunday/Monday:  CBC/D, BMP, and vancomycin trough. Labs - Thursday:  BMP and vancomycin trough Labs - Every other week:  ESR and CRP Please make sure this is administered after dialysis on dialysis days. 05/06/18 06/13/18  Oval Linsey, MD  VOLTAREN 1 % GEL APPLY 2 GRAMS EXTERNALLY TO THE AFFECTED AREA FOUR TIMES DAILY 05/09/18   Jean Rosenthal, MD    Family History Family History  Problem Relation Age of Onset  . Hypertension Mother   . Diabetes Mother   . Cancer Father     Social History Social History   Tobacco Use  . Smoking status: Former Smoker    Years: 10.00    Types: Cigarettes    Last attempt to quit: 08/08/2015    Years since quitting: 2.8  . Smokeless tobacco: Never Used  . Tobacco comment: casual smoking for 10 years  Substance Use Topics  . Alcohol use: No    Alcohol/week: 0.0 standard drinks    Comment: quit 2004  . Drug use: No    Comment: uds (+) cocaine in 08/2011 but pt states was taking sudafed at the time     Allergies   Lisinopril; Penicillins; Fentanyl; and Morphine and related   Review of Systems Review of Systems  Constitutional: Negative for chills,  diaphoresis, fatigue and fever.  HENT: Negative for congestion and rhinorrhea.   Eyes: Negative for visual disturbance.  Respiratory: Negative for cough, chest tightness, shortness of breath and wheezing.   Cardiovascular: Negative for chest pain, palpitations and leg swelling.  Gastrointestinal: Negative for abdominal pain, constipation, diarrhea, nausea and vomiting.  Genitourinary: Negative for dysuria, flank pain and pelvic pain.  Musculoskeletal: Positive for back pain. Negative for neck pain and neck stiffness.  Skin: Negative for rash and wound.  Neurological: Positive for seizures and headaches. Negative for tingling, weakness, light-headedness, numbness and paresthesias.  Psychiatric/Behavioral: Negative for agitation and confusion.  All other systems reviewed and are negative.    Physical Exam Updated Vital Signs BP (!) 173/71 (BP Location: Right Arm)   Pulse 62   Temp 98 F (36.7 C) (Oral)   Resp 20   SpO2 100%   Physical Exam  Constitutional: He is oriented to person, place, and time. He appears well-developed and well-nourished. No distress.  HENT:  Head: Normocephalic and atraumatic.  Mouth/Throat: Oropharynx is clear and moist. No oropharyngeal exudate.  Eyes: Pupils are equal, round, and reactive to light. Conjunctivae and EOM are normal.  Neck: Normal range of motion. Neck supple.  Cardiovascular: Normal rate and regular rhythm.  Murmur heard. Pulmonary/Chest: Effort normal and breath sounds normal. No respiratory distress. He has no wheezes. He has no rales. He exhibits no tenderness.  Abdominal: Soft. He exhibits no distension. There is no tenderness.  Musculoskeletal: He exhibits tenderness. He exhibits no edema.       Lumbar back: He exhibits tenderness and pain.       Back:  Neurological: He is alert and oriented to person, place, and time. No sensory deficit. He exhibits normal muscle tone.  Skin: Skin is warm and dry. Capillary refill takes less than 2  seconds. He is not diaphoretic. No erythema. No pallor.  Psychiatric: He has a normal mood and affect.  Nursing note  and vitals reviewed.    ED Treatments / Results  Labs (all labs ordered are listed, but only abnormal results are displayed) Labs Reviewed  CBC WITH DIFFERENTIAL/PLATELET - Abnormal; Notable for the following components:      Result Value   RBC 3.55 (*)    Hemoglobin 10.3 (*)    HCT 33.7 (*)    RDW 17.4 (*)    Platelets 148 (*)    Eosinophils Absolute 1.3 (*)    All other components within normal limits  COMPREHENSIVE METABOLIC PANEL - Abnormal; Notable for the following components:   Sodium 133 (*)    Potassium 3.3 (*)    Chloride 97 (*)    Glucose, Bld 125 (*)    BUN 40 (*)    Creatinine, Ser 10.30 (*)    Calcium 8.6 (*)    Albumin 3.3 (*)    GFR calc non Af Amer 5 (*)    GFR calc Af Amer 5 (*)    All other components within normal limits  CULTURE, BLOOD (ROUTINE X 2)  CULTURE, BLOOD (ROUTINE X 2)  LIPASE, BLOOD  URINALYSIS, ROUTINE W REFLEX MICROSCOPIC  I-STAT CG4 LACTIC ACID, ED  I-STAT CG4 LACTIC ACID, ED  I-STAT TROPONIN, ED  I-STAT CG4 LACTIC ACID, ED  I-STAT CG4 LACTIC ACID, ED  I-STAT TROPONIN, ED    EKG EKG Interpretation  Date/Time:  Saturday June 04 2018 13:53:13 EDT Ventricular Rate:  61 PR Interval:    QRS Duration: 116 QT Interval:  482 QTC Calculation: 486 R Axis:   70 Text Interpretation:  Sinus rhythm Prolonged PR interval LAE, consider biatrial enlargement Left ventricular hypertrophy ST elevation, consider anterior injury Borderline prolonged QT interval When compared to prior, no significant changes seen.  No STEMI Confirmed by Antony Blackbird [phone]) on 06/04/2018 3:05:51 PM   Radiology Dg Chest 2 View  Result Date: 06/04/2018 CLINICAL DATA:  Fall on Tuesday, continued back pain. Patient states he has a heart infection EXAM: CHEST - 2 VIEW COMPARISON:  Chest x-rays dated 06/02/2018, 05/27/2018, 05/02/2018 and  09/10/2016. FINDINGS: Stable cardiomegaly. Chronic central pulmonary vascular congestion and mild bilateral interstitial thickening, presumed interstitial edema. Small LEFT pleural effusion. No new lung findings. No pneumothorax seen. Degenerative endplate spurring throughout the thoracic spine. No acute or suspicious osseous finding. IMPRESSION: 1. Stable chest x-ray.  Persistent small LEFT pleural effusion. 2. Chronic mild CHF with associated central pulmonary vascular congestion and interstitial edema. 3. Stable cardiomegaly. 4. No acute appearing osseous abnormality. Chronic degenerative endplate spurring throughout the thoracic spine. Electronically Signed   By: Franki Cabot M.D.   On: 06/04/2018 10:57   Ct Head Wo [name]  Result Date: 06/04/2018 CLINICAL DATA:  Fall, seizure, headache, encephalopathy EXAM: CT HEAD WITHOUT [name] TECHNIQUE: Contiguous axial images were obtained from the base of the skull through the vertex without intravenous [name]. COMPARISON:  04/11/2018 FINDINGS: Brain: No evidence of acute infarction, hemorrhage, hydrocephalus, extra-axial collection or mass lesion/mass effect. Subcortical white matter and periventricular small vessel ischemic changes. Vascular: No hyperdense vessel or unexpected calcification. Skull: Normal. Negative for fracture or focal lesion. Sinuses/Orbits: The visualized paranasal sinuses are essentially clear. The mastoid air cells are unopacified. Other: None. IMPRESSION: No evidence of acute intracranial abnormality. Small vessel ischemic changes. Electronically Signed   By: Julian Hy M.D.   On: 06/04/2018 12:01   Jeffrey [name]  Result Date: 06/04/2018 CLINICAL DATA:  [place] 4 days ago. Persistent severe midline back pain with bilateral leg pain.  EXAM: MRI THORACIC AND LUMBAR SPINE WITHOUT [name] TECHNIQUE: Multiplanar and multiecho pulse sequences of the thoracic and lumbar spine were obtained without intravenous [name].  COMPARISON:  CT scan 04/01/2018 and prior lumbar spine MRI 09/12/2016 FINDINGS: MRI THORACIC SPINE FINDINGS Alignment:  Normal overall alignment. Vertebrae: No acute thoracic spine fracture or worrisome bone lesion. There are moderate to advanced mid to lower thoracic degenerative changes with fatty endplate reactive changes. Cord: Normal appearance of the thoracic spinal cord. No cord lesions or syrinx. Paraspinal and other soft tissues: No significant findings. There is a small left pleural effusion noted. Disc levels: No significant thoracic disc protrusions or spinal canal compromise. Diffuse bulging degenerated discs are noted in the lower thoracic spine with mild impression on the thecal sac. No foraminal lesions. Of note, at C7-T1 there is a left paracentral disc protrusion with mass effect on the left side of thecal sac but no obvious foraminal stenosis. MRI LUMBAR SPINE FINDINGS Segmentation: There are five lumbar type vertebral bodies. The last full intervertebral disc space is labeled L5-S1. This correlates with the prior Jeffrey examination. Alignment:  Normal Vertebrae: Advanced degenerative changes in the lower lumbar spine with degenerative disc disease and endplate reactive changes. Low T1 and T2 signal intensity likely due to renal osteodystrophy. No worrisome bone lesions or acute fracture. Conus medullaris and cauda equina: Conus extends to the L1 level. Conus and cauda equina appear normal. Paraspinal and other soft tissues: No significant paraspinal or retroperitoneal findings. Mild edema like signal abnormality on the STIR sequence in the paraspinal muscles could suggest muscle strain or partial tear. Small kidneys with multiple cysts are noted. No retroperitoneal process. Disc levels: No disc protrusions, spinal or foraminal stenosis. IMPRESSION: Jeffrey THORACIC SPINE IMPRESSION 1. Degenerative changes in the mid to lower thoracic spine with bulging discs but no significant canal compromise or neural  compression. 2. Normal appearance of the thoracic spinal cord. 3. Left paracentral disc protrusion noted at C7-T1. 4. Left pleural effusion. Jeffrey LUMBAR SPINE IMPRESSION 1. Stable appearance of the lumbar spine. Degenerate disc disease noted mainly at L4-5 and L5-S1 but no acute bony findings or worrisome bone lesion. 2. No disc protrusions, spinal or foraminal stenosis. 3. Mild edema like signal abnormality in the paraspinal muscles could suggest a muscle strain or partial tear. Electronically Signed   By: Marijo Sanes M.D.   On: 06/04/2018 13:25   Jeffrey Lumbar Spine Wo [name]  Result Date: 06/04/2018 CLINICAL DATA:  [place] 4 days ago. Persistent severe midline back pain with bilateral leg pain. EXAM: MRI THORACIC AND LUMBAR SPINE WITHOUT [name] TECHNIQUE: Multiplanar and multiecho pulse sequences of the thoracic and lumbar spine were obtained without intravenous [name]. COMPARISON:  CT scan 04/01/2018 and prior lumbar spine MRI 09/12/2016 FINDINGS: MRI THORACIC SPINE FINDINGS Alignment:  Normal overall alignment. Vertebrae: No acute thoracic spine fracture or worrisome bone lesion. There are moderate to advanced mid to lower thoracic degenerative changes with fatty endplate reactive changes. Cord: Normal appearance of the thoracic spinal cord. No cord lesions or syrinx. Paraspinal and other soft tissues: No significant findings. There is a small left pleural effusion noted. Disc levels: No significant thoracic disc protrusions or spinal canal compromise. Diffuse bulging degenerated discs are noted in the lower thoracic spine with mild impression on the thecal sac. No foraminal lesions. Of note, at C7-T1 there is a left paracentral disc protrusion with mass effect on the left side of thecal sac but no obvious foraminal stenosis. MRI LUMBAR SPINE FINDINGS Segmentation:  There are five lumbar type vertebral bodies. The last full intervertebral disc space is labeled L5-S1. This correlates with the prior Jeffrey  examination. Alignment:  Normal Vertebrae: Advanced degenerative changes in the lower lumbar spine with degenerative disc disease and endplate reactive changes. Low T1 and T2 signal intensity likely due to renal osteodystrophy. No worrisome bone lesions or acute fracture. Conus medullaris and cauda equina: Conus extends to the L1 level. Conus and cauda equina appear normal. Paraspinal and other soft tissues: No significant paraspinal or retroperitoneal findings. Mild edema like signal abnormality on the STIR sequence in the paraspinal muscles could suggest muscle strain or partial tear. Small kidneys with multiple cysts are noted. No retroperitoneal process. Disc levels: No disc protrusions, spinal or foraminal stenosis. IMPRESSION: Jeffrey THORACIC SPINE IMPRESSION 1. Degenerative changes in the mid to lower thoracic spine with bulging discs but no significant canal compromise or neural compression. 2. Normal appearance of the thoracic spinal cord. 3. Left paracentral disc protrusion noted at C7-T1. 4. Left pleural effusion. Jeffrey LUMBAR SPINE IMPRESSION 1. Stable appearance of the lumbar spine. Degenerate disc disease noted mainly at L4-5 and L5-S1 but no acute bony findings or worrisome bone lesion. 2. No disc protrusions, spinal or foraminal stenosis. 3. Mild edema like signal abnormality in the paraspinal muscles could suggest a muscle strain or partial tear. Electronically Signed   By: Marijo Sanes M.D.   On: 06/04/2018 13:25    Procedures Procedures (including critical care time)  Medications Ordered in ED Medications  HYDROmorphone (DILAUDID) injection 0.5 mg (has no administration in time range)  HYDROmorphone (DILAUDID) injection 1 mg (1 mg Intravenous Given 06/04/18 1121)     Initial Impression / Assessment and Plan / ED Course  I have reviewed the triage vital signs and the nursing notes.  Pertinent labs & imaging results that were available during my care of the patient were reviewed by me and  considered in my medical decision making (see chart for details).     Jeffrey [name] is a 62 y.o. male with a past medical history significant for HIV, hepatitis B, COPD, currently on vancomycin for endocarditis, ESRD on home hemodialysis, hypertension, seizures, asthma, and CAD who presents with severe back pain, constipation, and worsened seizures.  Patient reports that he is currently on antibiotics for endocarditis.  He reports that he was walking outside several days ago when he had a fall forward.  He reports no onset of pain after the fall but 2 days ago started having severe mid and low back pain.  He described the pain is up to an 8 or 9 out of 10 in severity.  Initially he reported it went down his legs but now he says it does not.  He says he has not had a history of severe constipation but over the last week has had constipation.  He reports taking mag citrate and had some loose watery stool but no good bowel movement.  He says he does not make any urine.  He does report headaches over the last few days but currently says having a headache.  He denies neck pain or neck stiffness.  His family reports that he had a seizure yesterday and then while in triage today had a seizure.  He denies cough or congestion today.  On exam, patient moving all extremities.  Normal sensation throughout.  Patient's abdomen is nontender.  Back is tender in the mid lower thoracic and lumbar spine in the midline.  Patient's lungs were  clear.  Based on patient's report that he is currently being treated for endocarditis and has new severe thoracic and lumbar back pain with constipation, patient with MRIs to look for septic emboli.  With his reported increase in seizure frequency, he will have head CT.  He will have screening laboratory and imaging testing to look for worsening infection or sepsis.  He will be given pain medicine for his back pain.  Anticipate reassessment after work-up.  Patient's work-up was overall  reassuring.  Patient no further seizure-like episodes.  Patient reported his pain drastically improved with pain medications.  Patient's MRI did not show evidence of epidural abscess or infection but showed evidence of paraspinal muscular strain.  There was evidence of disc bulges and osteoarthritis however no evidence of acute cord compression.  Laboratory testing was similar to prior and no evidence of infection worsening was seen.  Patient was given prescription for pain medication and follow-up with his PCP.  He will continue taking his seizure medication, I suspect he was having pain related episodes.   He reports that the seizure episodes he had yesterday and today were the kind where he shook but was still aware and did not lose consciousness.  I have a lower suspicion that he this was true epileptic episodes he was describing a more related to his severe back pain.  Patient understood return precautions and had no depressions or concerns.  Patient discharged in good condition.   Final Clinical Impressions(s) / ED Diagnoses   Final diagnoses:  Fall, initial encounter  Strain of lumbar paraspinous muscle, initial encounter    ED Discharge Orders         Ordered    oxyCODONE-acetaminophen (PERCOCET/ROXICET) 5-325 MG tablet  Every 4 hours PRN     06/04/18 1519          Clinical Impression: 1. Fall, initial encounter   2. Strain of lumbar paraspinous muscle, initial encounter     Disposition: Discharge  Condition: Good  I have discussed the results, Dx and Tx plan with the pt(& family if present). He/she/they expressed understanding and agree(s) with the plan. Discharge instructions discussed at great length. Strict return precautions discussed and pt &/or family have verbalized understanding of the instructions. No further questions at time of discharge.    New Prescriptions   OXYCODONE-ACETAMINOPHEN (PERCOCET/ROXICET) 5-325 MG TABLET    Take 1 tablet by mouth every 4 (four)  hours as needed for severe pain.    Follow Up: No follow-up provider specified.    Yuktha Kerchner, Gwenyth Allegra, MD 06/04/18 1536

## 2018-06-04 NOTE — ED Notes (Signed)
Pt departed in NAD.  

## 2018-06-04 NOTE — Progress Notes (Signed)
The family member saw me in the ED and asked if I would pray for the patient. I spoke with the patient and provided spiritual support by leading in prayer. The patient expressed that he was comforted by the prayer.     06/04/18 1130  Clinical Encounter Type  Visited With Patient and family together  Visit Type Spiritual support  Referral From Family  Consult/Referral To Chaplain  Spiritual Encounters  Spiritual Needs Prayer  Stress Factors  Patient Stress Factors Exhausted  Family Stress Factors Exhausted    Chaplain Dr Redgie Grayer

## 2018-06-06 ENCOUNTER — Ambulatory Visit (INDEPENDENT_AMBULATORY_CARE_PROVIDER_SITE_OTHER): Payer: Medicare Other | Admitting: Neurology

## 2018-06-06 ENCOUNTER — Other Ambulatory Visit: Payer: Self-pay

## 2018-06-06 ENCOUNTER — Encounter: Payer: Self-pay | Admitting: Neurology

## 2018-06-06 ENCOUNTER — Encounter: Payer: Self-pay | Admitting: Infectious Diseases

## 2018-06-06 VITALS — BP 190/83 | HR 57 | Resp 18 | Ht 72.0 in | Wt 154.5 lb

## 2018-06-06 DIAGNOSIS — Z992 Dependence on renal dialysis: Principal | ICD-10-CM

## 2018-06-06 DIAGNOSIS — N2581 Secondary hyperparathyroidism of renal origin: Secondary | ICD-10-CM | POA: Diagnosis not present

## 2018-06-06 DIAGNOSIS — B957 Other staphylococcus as the cause of diseases classified elsewhere: Secondary | ICD-10-CM

## 2018-06-06 DIAGNOSIS — N186 End stage renal disease: Secondary | ICD-10-CM

## 2018-06-06 DIAGNOSIS — D631 Anemia in chronic kidney disease: Secondary | ICD-10-CM | POA: Diagnosis not present

## 2018-06-06 DIAGNOSIS — Z792 Long term (current) use of antibiotics: Secondary | ICD-10-CM | POA: Diagnosis not present

## 2018-06-06 DIAGNOSIS — R569 Unspecified convulsions: Secondary | ICD-10-CM

## 2018-06-06 DIAGNOSIS — I120 Hypertensive chronic kidney disease with stage 5 chronic kidney disease or end stage renal disease: Secondary | ICD-10-CM | POA: Diagnosis not present

## 2018-06-06 DIAGNOSIS — I330 Acute and subacute infective endocarditis: Secondary | ICD-10-CM

## 2018-06-06 DIAGNOSIS — R7881 Bacteremia: Secondary | ICD-10-CM | POA: Diagnosis not present

## 2018-06-06 DIAGNOSIS — D509 Iron deficiency anemia, unspecified: Secondary | ICD-10-CM | POA: Diagnosis not present

## 2018-06-06 MED ORDER — LACOSAMIDE 100 MG PO TABS: 100.0000 mg | ORAL_TABLET | Freq: Two times a day (BID) | ORAL | 11 refills | Status: DC

## 2018-06-06 NOTE — Telephone Encounter (Signed)
Agree He needs to see PCP, probably needs MRI thanks

## 2018-06-06 NOTE — Patient Outreach (Signed)
[facility]) Care Management  06/06/2018  Jeffrey [name] Feb 02, 1956 [id number]    62 year old with recent admission 8/11-8/16 with ESRD/HD, Bacteremia, MRSE aortic valve. 4 admissions in the past 6 months and 7 ED visits in the past 6 months.  8/11-8/16 ESRD, Bacteremia, MRSE aortic valve endocarditis 05/27/18 ED presentation SOB 06/02/18 ED presentation chest pain  06/03/18 ED presentation Fall, back pain-did not stay 06/04/18 ED presentation Fall, lumbar strain  RNCM called to follow up. Spoke with [name], client's brother/caregiver, who reports that client has a rough day on yesterday, but is doing better today. [name] states he is getting ready to give client his dialysis treatment. [name] states client has a neurology appointment in [facility] tomorrow.   RNCM discussed a time to continue assessment. [name] reports client has a lot of appointments over the next several weeks and request to schedule a time for a telephonic assessment.   Plan: RNCM will follow up with telephonic assessment later this week.  [name], RN, MSN, [place] Coordinator Cell: [phone]

## 2018-06-06 NOTE — Progress Notes (Signed)
PATIENT: Jeffrey [name] DOB: 04-Jul-1956  Chief Complaint  Patient presents with  . Seizures    New room. PCP: Jeffrey [name]. Jeffrey [name] is here with his brother Jeffrey [name], for eval of sz. d/o.  First sz. was 06/03/11.  It was determined that sz. was due to renal failure. He was not started on sz. meds at that time. He didn't have another sz. until July 2019.  He had 3 that month, last on on 04/10/18. He was started on Vimpat 70m bid every day, with an extra 261mafter dialysis on   . ESRD    M/W/F.  Hx. of HIV, Hep. B.  About a 1 min. episode of decreased responsiveness at 0740 this am.  Pt. quickly returned to baseline. VS during event were 176/80-57-16-96% sat on R/A. Jeffrey [name] sts. past sz. have presented like this./fim  . HIV Positive/AIDS  . Hepatitis B     HISTORICAL  PeGENESIS PAGETs a [age]ears old male, seen in request by his primary care physician Dr. BlKoleen DistanceCaNila Nephewand Dr. CaVelna Ochsor evaluation of seizure, he is accompanied by his brother JaJeneen Rinkst today's clinical visit.  Initial evaluation was on June 06, 2018.  I have reviewed and summarized the referring note from the referring physician, he has past medical history of end-stage renal disease on dialysis, HIV, on antivirus treatment with undetectable virus load, anemia, hepatitis B, endocarditis, he is getting home dialysis by his brother 3 times a week since 2012,  First seizure was on June 01, 2011, generalized tonic-clonic seizure, was considered due to worsening end-stage dialysis, was treated with hemodialysis,  Second seizure was in June 2019, he was initially treated with Keppra, which was discontinued due to thrombocytopenia, but with negative heat antibody, Keppra is most likely the culprit.  Third seizure was in July 2019, was considered due to rapid hemodynamic shifting, brother reported to much fluid was taken out,  He was treated with Vimpat 75 mg twice a day since August 2019, with extra 25 mg  after each dialysis,  For seizure was on June 04, 2018, he had sudden onset black staring, with post event confusion, fatigue,  He also had a witnessed episode at today's visit on June 06, 2018, he had transient stairs, unresponsive, then complains of fatigue, he did not sleep well last night, also complains of nausea, generalized weakness.  Echocardiogram on May 03, 2018, left ventricular apex is trabeculated, there is a false tendon in the LV apex, there was severe concentric hypertrophy, ejection fraction was 55 to 60%, wall motion was normal, aortic valve showed moderate to severe regurgitation, left atrium was severely dilated, mild dilated right ventricular size, left pleural effusion and trivial pericardial effusion posteriorly,  Personally reviewed MRI of the brain without contrast on April 11, 2018, no acute abnormality, stable mild generalized atrophy, supratentorium small vessel disease.  MRI of thoracic spine: Degenerative changes, no significant canal foraminal stenosis, left paracentral disc protrusion at C7-T1 MRI of lumbar showed stable appearance of lumbar spine, degenerative disc disease at L4-5, L5-S1, there was no acute abnormality, mild edema in the paraspinal muscle,   REVIEW OF SYSTEMS: Full 14 system review of systems performed and notable only for anemia, trouble swallowing, constipation, seizure, not enough sleep, change in appetite, confusion All other review of systems were negative.  ALLERGIES: Allergies  Allergen Reactions  . Lisinopril Anaphylaxis and Shortness Of Breath    Throat swelling  . Penicillins Anaphylaxis and Other (See Comments)  Childhood allergy Has patient had a PCN reaction causing immediate rash, facial/tongue/throat swelling, SOB or lightheadedness with hypotension: Yes Has patient had a PCN reaction causing severe rash involving mucus membranes or skin necrosis: Unk Has patient had a PCN reaction that required hospitalization:  Unk Has patient had a PCN reaction occurring within the last 10 years: No If all of the above answers are "NO", then may proceed with Cephalosporin use.   . Fentanyl Other (See Comments)    Lethargy, AMS  . Morphine And Related Other (See Comments)    "Not Himself"    HOME MEDICATIONS: Current Outpatient Medications  Medication Sig Dispense Refill  . albuterol (PROVENTIL) (2.5 MG/3ML) 0.083% nebulizer solution Take 2.5 mg by nebulization every 6 (six) hours as needed for wheezing or shortness of breath.    . bictegravir-emtricitabine-tenofovir AF (BIKTARVY) 50-200-25 MG TABS tablet Take 1 tablet by mouth daily. 30 tablet 11  . calcium acetate (PHOSLO) 667 MG capsule Take 667-2,001 mg by mouth See admin instructions. Take 1,334 mg three times a day with each meal and 667 mg with each snack    . carvedilol (COREG) 25 MG tablet Take 1 tablet (25 mg total) by mouth 2 (two) times daily with a meal. 60 tablet 5  . cloNIDine (CATAPRES - DOSED IN MG/24 HR) 0.2 mg/24hr patch Place 1 patch (0.2 mg total) onto the skin once a week. 4 patch 12  . clotrimazole-betamethasone (LOTRISONE) cream Apply 1 application topically 2 (two) times daily. 30 g 0  . epoetin alfa (EPOGEN,PROCRIT) 2000 UNIT/ML injection Inject 2,000 Units into the vein 3 (three) times a week.     . ethyl chloride spray Apply 1 application topically daily as needed (HD- to numb).   12  . heparin 1000 UNIT/ML injection Inject 3,000 Units into the vein 4 (four) times a week.     . hydrALAZINE (APRESOLINE) 100 MG tablet Take 1 tablet (100 mg total) by mouth 3 (three) times daily. 90 tablet 1  . HYDROcodone-acetaminophen (NORCO/VICODIN) 5-325 MG tablet Take 1 tablet by mouth every 6 (six) hours as needed. 10 tablet 0  . Ipratropium-Albuterol (COMBIVENT IN) Inhale 2 puffs into the lungs every 6 (six) hours as needed (as needed for shortness of breath).     . lacosamide (VIMPAT) 50 MG TABS tablet Take 1.5 tablets (75 mg total) by mouth 2 (two)  times daily. 60 tablet 5  . lacosamide (VIMPAT) 50 MG TABS tablet Take one half pill (25 mg) after hemodialysis 30 tablet 5  . multivitamin (RENA-VIT) TABS tablet Take 1 tablet by mouth daily.      Marland Kitchen omeprazole (PRILOSEC) 20 MG capsule Take 1 capsule (20 mg total) by mouth daily. 90 capsule 3  . oxyCODONE-acetaminophen (PERCOCET/ROXICET) 5-325 MG tablet Take 1 tablet by mouth every 4 (four) hours as needed for severe pain. 10 tablet 0  . traZODone (DESYREL) 50 MG tablet TAKE 1 TABLET(50 MG) BY MOUTH AT BEDTIME AS NEEDED FOR SLEEP 30 tablet 0  . vancomycin IVPB Inject 750 mg into the vein every dialysis. Indication: MRSE bacteremia/endocarditis Last Day of Therapy:  06/13/18 Labs - Sunday/Monday:  CBC/D, BMP, and vancomycin trough. Labs - Thursday:  BMP and vancomycin trough Labs - Every other week:  ESR and CRP Please make sure this is administered after dialysis on dialysis days. 20 Units 0  . VOLTAREN 1 % GEL APPLY 2 GRAMS EXTERNALLY TO THE AFFECTED AREA FOUR TIMES DAILY 100 g 1  . hydrOXYzine (ATARAX/VISTARIL) 10 MG tablet  Take 1 tablet (10 mg total) by mouth 2 (two) times daily as needed for itching. (Patient not taking: Reported on 06/06/2018) 30 tablet 0  . iron sucrose (VENOFER) 20 MG/ML injection Inject 100 mg into the vein every Monday.     . megestrol (MEGACE) 40 MG tablet Take 2 tablets (80 mg total) by mouth 2 (two) times daily. (Patient not taking: Reported on 06/06/2018) 120 tablet 0   No current facility-administered medications for this visit.     PAST MEDICAL HISTORY: Past Medical History:  Diagnosis Date  . Anemia   . Anxiety   . Arthritis   . Asthma    per pt hx  . End stage renal disease on home HD 07/10/2011   [facility]ed HD in September [facility] at [facility] with a tunneled HD catheter, now on home HD with NxtStage. Dialyzing through AVF L lower arm with buttonhole technique as of mid 2014. His brother does the HD treatments at home.  They are roommates for 23 years.  The brother  works 3rd shift and gets off about 8am and then puts Mr Rosete on HD in the morning after getting home. Most of the time he does HD about 4 times a week, for about 4 hours per treatment. Cause of ESRD was HTN according to patient. He says he let his health go and ending up with complications, and that he didn't like seeing doctors in those days.  He says he was diagnosed with severe HTN when he lived in [place] in his 86's.   . Hepatitis B carrier ([place])   . HIV infection ([place])   . Hypertension   . Hyperthyroidism    normal now  . Pneumonia several yrs ago  . Seizure ([place])   . Seizures ([place]) 06/02/2011   x 1 none since  . Thrombocytopenia ([place])     PAST SURGICAL HISTORY: Past Surgical History:  Procedure Laterality Date  . AV FISTULA PLACEMENT  06/02/11   Left radiocephalic AVF  . BIOPSY  02/17/2018   Procedure: BIOPSY;  Surgeon: Milus Banister, MD;  Location: [facility] ENDOSCOPY;  Service: Endoscopy;;  . COLONOSCOPY    . COLONOSCOPY WITH PROPOFOL N/A 01/27/2018   Procedure: COLONOSCOPY WITH PROPOFOL;  Surgeon: [name], MD;  Location: [facility] ENDOSCOPY;  Service: Gastroenterology;  Laterality: N/A;  . CORONARY ANGIOPLASTY    . ERCP  03/21/2018   AT [place]  . ESOPHAGOGASTRODUODENOSCOPY N/A 02/17/2018   Procedure: ESOPHAGOGASTRODUODENOSCOPY (EGD);  Surgeon: Milus Banister, MD;  Location: [facility] ENDOSCOPY;  Service: Endoscopy;  Laterality: N/A;  . ESOPHAGOGASTRODUODENOSCOPY (EGD) WITH PROPOFOL N/A 01/27/2018   Procedure: ESOPHAGOGASTRODUODENOSCOPY (EGD) WITH PROPOFOL;  Surgeon: [name], MD;  Location: [facility] ENDOSCOPY;  Service: Gastroenterology;  Laterality: N/A;  . ESOPHAGOGASTRODUODENOSCOPY (EGD) WITH PROPOFOL N/A 04/03/2018   Procedure: ESOPHAGOGASTRODUODENOSCOPY (EGD) WITH PROPOFOL;  Surgeon: Clarene Essex, MD;  Location: [place];  Service: Endoscopy;  Laterality: N/A;  . EUS N/A 02/17/2018   Procedure: UPPER ENDOSCOPIC ULTRASOUND (EUS) RADIAL;  Surgeon: Milus Banister, MD;   Location: [facility] ENDOSCOPY;  Service: Endoscopy;  Laterality: N/A;  . fistulaogram     x 2 last 2 years  . IR DIALY SHUNT INTRO NEEDLE/INTRACATH INITIAL W/IMG LEFT Left 04/20/2018  . IR FLUORO GUIDE CV LINE RIGHT  09/16/2017  . IR US GUIDE VASC ACCESS RIGHT  09/16/2017  . IRRIGATION AND DEBRIDEMENT KNEE Left 09/15/2017   Procedure: IRRIGATION AND DEBRIDEMENT KNEE; arthroscopic clean out;  Surgeon: [name], MD;  Location: [facility] ORS;  Service: Orthopedics;  Laterality: Left;  . OTHER SURGICAL HISTORY     removal temporary HD catheter   . REVISON OF ARTERIOVENOUS FISTULA Left 10/10/2015   Procedure: REVISON OF LEFT RADIOCEPHALIC ARTERIOVENOUS FISTULA;  Surgeon: Angelia Mould, MD;  Location: [place];  Service: Vascular;  Laterality: Left;  . REVISON OF ARTERIOVENOUS FISTULA Left 02/07/2016   Procedure: REPAIR OF PSEUDO-ANEUREYSM OF LEFT ARM  ARTERIOVENOUS FISTULA;  Surgeon: Angelia Mould, MD;  Location: [place];  Service: Vascular;  Laterality: Left;  . REVISON OF ARTERIOVENOUS FISTULA Left 03/09/5092   Procedure: PLICATION OF LEFT ARM RADIOCEPHALIC ARTERIOVENOUS FISTULA PSEUDOANEURYSM;  Surgeon: Angelia Mould, MD;  Location: [place];  Service: Vascular;  Laterality: Left;  . RIGHT/LEFT HEART CATH AND CORONARY ANGIOGRAPHY N/A 02/17/2017   Procedure: Right/Left Heart Cath and Coronary Angiography;  Surgeon: Sherren Mocha, MD;  Location: [place] CV LAB;  Service: Cardiovascular;  Laterality: N/A;  . TEE WITHOUT CARDIOVERSION N/A 09/11/2016   Procedure: TRANSESOPHAGEAL ECHOCARDIOGRAM (TEE);  Surgeon: Dorothy Spark, MD;  Location: [place];  Service: Cardiovascular;  Laterality: N/A;  . TEE WITHOUT CARDIOVERSION N/A 05/05/2018   Procedure: TRANSESOPHAGEAL ECHOCARDIOGRAM (TEE);  Surgeon: Adrian Prows, MD;  Location: [facility] ENDOSCOPY;  Service: Cardiovascular;  Laterality: N/A;  Prefer after 3:30 PM    FAMILY HISTORY: Family History  Problem Relation Age of Onset  . Hypertension  Mother   . Diabetes Mother   . Cancer Father     SOCIAL HISTORY: Social History   Socioeconomic History  . Marital status: Single    Spouse name: Not on file  . Number of children: Not on file  . Years of education: Not on file  . Highest education level: Not on file  Occupational History  . Not on file  Social Needs  . Financial resource strain: Not on file  . Food insecurity:    Worry: Not on file    Inability: Not on file  . Transportation needs:    Medical: Not on file    Non-medical: Not on file  Tobacco Use  . Smoking status: Former Smoker    Years: 10.00    Types: Cigarettes    Last attempt to quit: 08/08/2015    Years since quitting: 2.8  . Smokeless tobacco: Never Used  . Tobacco comment: casual smoking for 10 years  Substance and Sexual Activity  . Alcohol use: No    Alcohol/week: 0.0 standard drinks    Comment: quit 2004  . Drug use: No    Comment: uds (+) cocaine in 08/2011 but pt states was taking sudafed at the time  . Sexual activity: Not Currently    Partners: Male  Lifestyle  . Physical activity:    Days per week: Not on file    Minutes per session: Not on file  . Stress: Not on file  Relationships  . Social connections:    Talks on phone: Not on file    Gets together: Not on file    Attends religious service: Not on file    Active member of club or organization: Not on file    Attends meetings of clubs or organizations: Not on file    Relationship status: Not on file  . Intimate partner violence:    Fear of current or ex partner: Not on file    Emotionally abused: Not on file    Physically abused: Not on file    Forced sexual activity: Not on file  Other Topics Concern  .  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  PHYSICAL EXAMNIATION:  Gen: NAD, conversant, well nourised, obese, well groomed                     Cardiovascular: Regular rate rhythm, no peripheral edema, warm, nontender. Eyes: Conjunctivae clear without exudates or hemorrhage Neck: Supple, no carotid bruits. Pulmonary: Clear to auscultation bilaterally   NEUROLOGICAL EXAM:  MENTAL STATUS: Speech:    Speech is normal; fluent and spontaneous with normal comprehension.  Cognition:     Orientation to time, place and person     Normal recent and remote memory     Normal Attention span and concentration     Normal Language, naming, repeating,spontaneous speech     Fund of knowledge   CRANIAL NERVES: CN II: Visual fields are full to confrontation. Fundoscopic exam is normal with sharp discs and no vascular changes. Pupils are round equal and briskly reactive to light. CN III, IV, VI: extraocular movement are normal. No ptosis. CN V: Facial sensation is intact to pinprick in all 3 divisions bilaterally. Corneal responses are intact.  CN VII: Face is symmetric with normal eye closure and smile. CN VIII: Hearing is normal to rubbing fingers CN IX, X: Palate elevates symmetrically. Phonation is normal. CN XI: Head turning and shoulder shrug are intact CN XII: Tongue is midline with normal movements and no atrophy.  MOTOR: There is no pronator drift of out-stretched arms. Muscle bulk and tone are normal. Muscle strength is normal.  REFLEXES: Reflexes are 2+ and symmetric at the biceps, triceps, knees, and ankles. Plantar responses are flexor.  SENSORY: Intact to light touch, pinprick, positional sensation and vibratory sensation are intact in fingers and toes.  COORDINATION: Rapid alternating movements and fine finger movements are intact. There is no dysmetria on finger-to-nose and  heel-knee-shin.    GAIT/STANCE: He needs assistance to get up from seated position, cautious, unsteady, generalized fatigue   DIAGNOSTIC DATA (LABS, IMAGING, TESTING) - I reviewed patient records, labs, notes, testing and imaging myself where available.   ASSESSMENT AND PLAN  RAJAN BURGARD is a 62 y.o. male   ESRD on hemodialysis Seizure  EEG  Increase Vimpat to 100 mg twice daily, 25 mg extra day after dialysis  Marcial Pacas, M.D. Ph.D.  [facility] [address], [place], [place] [address] Ph: [phone] Fax: [phone]  CC: Delice Bison, DO

## 2018-06-07 DIAGNOSIS — D509 Iron deficiency anemia, unspecified: Secondary | ICD-10-CM | POA: Diagnosis not present

## 2018-06-07 DIAGNOSIS — N186 End stage renal disease: Secondary | ICD-10-CM | POA: Diagnosis not present

## 2018-06-07 DIAGNOSIS — D631 Anemia in chronic kidney disease: Secondary | ICD-10-CM | POA: Diagnosis not present

## 2018-06-07 DIAGNOSIS — N2581 Secondary hyperparathyroidism of renal origin: Secondary | ICD-10-CM | POA: Diagnosis not present

## 2018-06-07 DIAGNOSIS — Z992 Dependence on renal dialysis: Secondary | ICD-10-CM | POA: Diagnosis not present

## 2018-06-08 ENCOUNTER — Ambulatory Visit: Payer: Medicare Other

## 2018-06-08 ENCOUNTER — Other Ambulatory Visit: Payer: Self-pay

## 2018-06-08 ENCOUNTER — Ambulatory Visit (INDEPENDENT_AMBULATORY_CARE_PROVIDER_SITE_OTHER): Payer: Medicare Other | Admitting: Sports Medicine

## 2018-06-08 VITALS — BP 199/68 | Ht 72.0 in | Wt 154.0 lb

## 2018-06-08 DIAGNOSIS — M19012 Primary osteoarthritis, left shoulder: Secondary | ICD-10-CM

## 2018-06-08 MED ORDER — METHYLPREDNISOLONE ACETATE 40 MG/ML IJ SUSP
40.0000 mg | Freq: Once | INTRAMUSCULAR | Status: AC
  Administered 2018-06-08: 40 mg via INTRA_ARTICULAR

## 2018-06-08 NOTE — Patient Instructions (Signed)
Thank you for coming to see [name] today in clinic.  You were given a corticosteroid injection into your shoulder joint at today's visit.  Hopefully this will be some lasting relief moving forward.  We will see back in 4 to 6 weeks for follow-up.  If you have any questions or concerns in interim, please give [name] a call.

## 2018-06-08 NOTE — Progress Notes (Signed)
HPI  CC: Left shoulder pain  Jeffrey [name] is a 62 year old male with very complex medical history who presents today for left shoulder pain follow-up.  He had an MRI performed the shoulder last month, which showed that he had severe degenerative changes in his left shoulder.  He is tried diclofenac gel as well as Tylenol with only minimal relief of pain.  He is also tried Percocet in the past for pain relief with minimal relief.  He has never had a steroid injection into the shoulder.  He denies any numbness tingling down his arm.  He continues to report pain with overhead activity.  He states he does have some mobility issues of the shoulder.  He states the pain is keeping him up at nighttime.  The pain does not radiate any other part of the body.  He does report a trigger point of pain in the lower portion of his trapezius muscle on the right side.  He states that this is bothering him for several weeks now.  He denies any numbness tingling few non-body.  He denies any urinary incontinence.  He denies any worsening weakness of his lower extremities.  He is wheelchair-bound.  See HPI and/or previous note for associated ROS.  Objective: BP (!) 199/68   Ht 6' (1.829 m)   Wt 154 lb (69.9 kg)   BMI 20.89 kg/m  Gen: right-Hand Dominant. NAD, wheelchair bound CV: Well-perfused. Warm.  Resp: Non-labored.  Neuro: Sensation intact throughout.   shoulder exam: No warmth, erythema, swelling noted.  Tenderness palpation along the posterior edge of the shoulder.  Tenderness palpation at the inferior edge of the trapezius muscle medial to the scapula on the right side.  With very limited range of motion.  Passively he has full range of motion in extension, abduction, abduction, internal and external rotation.  Actively he is unable to give a good effort to assess range of motion.  Strength testing is very limited due to pain.  Special test not performed on shoulder today due to pain.  Assessment and plan: 1.   Primary osteoarthritis of left shoulder 2.  Trigger point of lower trapezius muscle on the left side  INJECTION: Patient was given informed consent, signed copy in the chart. Appropriate time out was taken. Area prepped and draped in usual sterile fashion. 1 cc of methylprednisolone 40 mg/ml plus  3 cc of 1% lidocaine without epinephrine was injected into the left glenohumeral joint using a(n) posterior approach under ultrasound guidance. The patient tolerated the procedure well. There were no complications. Post procedure instructions were given.  INJECTION: Patient was given informed consent, signed copy in the chart. Appropriate time out was taken. Area prepped and draped in usual sterile fashion.   1 cc of 1% lidocaine without epinephrine was injected into the right lower trapezius muscle. The patient tolerated the procedure well. There were no complications. Post procedure instructions were given.  I provided Andry with a intra-articular shoulder injection left side at today's visit.  He tolerated procedure well and stated this pain was relieved following the injection.  I also gave him a trigger point injection in his right trapezius muscle today.  He states this also improved his back pain.  His medical history is very complex, and his options for treatment very limited.  If he does not get continued relief from this intervention, which can consider more long-term pain management plans for him.  He would not be an appropriate surgical candidate for shoulder replacement.  He is already getting home physical therapy, which does not seem to be helping much with his rehabilitation.  We will see him back in clinic in 6 weeks to reassess.  He can get a steroid injections in his shoulder every 3 months.  Lewanda Rife, MD [facility] Fellow 06/08/2018 1:02 PM

## 2018-06-08 NOTE — Patient Outreach (Signed)
[facility]) Care Management  06/08/2018  Jeffrey [name] 07-12-1956 [id number]   62 year old with recent admission 8/11-8/16 with ESRD/HD, Bacteremia, MRSE aortic valve.4 admissions in the past 6 months and 7 ED visits in the past 6 months.  8/11-8/16 ESRD, Bacteremia, MRSE aortic valve endocarditis 05/27/18 ED presentation SOB 06/02/18 ED presentation chest pain  06/03/18 ED presentation Fall, back pain-did not stay 06/04/18 ED presentation Fall, lumbar strain  RNCM called to complete assessment: No answer. HIPPA compliant message left.  Per chart, client saw Sports Medicine provider earlier this morning.  Plan: await return call. Follow up call within 3-4 business days.  [name], RN, MSN, [place] Coordinator Cell: [phone]

## 2018-06-09 ENCOUNTER — Inpatient Hospital Stay (HOSPITAL_COMMUNITY): Admission: RE | Admit: 2018-06-09 | Payer: Medicare Other | Source: Ambulatory Visit

## 2018-06-09 ENCOUNTER — Ambulatory Visit: Payer: Medicare Other

## 2018-06-09 DIAGNOSIS — D631 Anemia in chronic kidney disease: Secondary | ICD-10-CM | POA: Diagnosis not present

## 2018-06-09 DIAGNOSIS — D509 Iron deficiency anemia, unspecified: Secondary | ICD-10-CM | POA: Diagnosis not present

## 2018-06-09 DIAGNOSIS — Z992 Dependence on renal dialysis: Secondary | ICD-10-CM | POA: Diagnosis not present

## 2018-06-09 DIAGNOSIS — N186 End stage renal disease: Secondary | ICD-10-CM | POA: Diagnosis not present

## 2018-06-09 DIAGNOSIS — N2581 Secondary hyperparathyroidism of renal origin: Secondary | ICD-10-CM | POA: Diagnosis not present

## 2018-06-09 LAB — CULTURE, BLOOD (ROUTINE X 2)
CULTURE: NO GROWTH
Culture: NO GROWTH
SPECIAL REQUESTS: ADEQUATE
SPECIAL REQUESTS: ADEQUATE

## 2018-06-10 DIAGNOSIS — N186 End stage renal disease: Secondary | ICD-10-CM | POA: Diagnosis not present

## 2018-06-10 DIAGNOSIS — D631 Anemia in chronic kidney disease: Secondary | ICD-10-CM | POA: Diagnosis not present

## 2018-06-10 DIAGNOSIS — R7881 Bacteremia: Secondary | ICD-10-CM | POA: Diagnosis not present

## 2018-06-10 DIAGNOSIS — I330 Acute and subacute infective endocarditis: Secondary | ICD-10-CM | POA: Diagnosis not present

## 2018-06-10 DIAGNOSIS — B957 Other staphylococcus as the cause of diseases classified elsewhere: Secondary | ICD-10-CM | POA: Diagnosis not present

## 2018-06-10 DIAGNOSIS — Z792 Long term (current) use of antibiotics: Secondary | ICD-10-CM | POA: Diagnosis not present

## 2018-06-10 DIAGNOSIS — I120 Hypertensive chronic kidney disease with stage 5 chronic kidney disease or end stage renal disease: Secondary | ICD-10-CM | POA: Diagnosis not present

## 2018-06-11 ENCOUNTER — Other Ambulatory Visit: Payer: Self-pay | Admitting: Internal Medicine

## 2018-06-13 ENCOUNTER — Ambulatory Visit (INDEPENDENT_AMBULATORY_CARE_PROVIDER_SITE_OTHER): Payer: Medicare Other | Admitting: Neurology

## 2018-06-13 ENCOUNTER — Other Ambulatory Visit: Payer: Self-pay | Admitting: Internal Medicine

## 2018-06-13 ENCOUNTER — Encounter: Payer: Self-pay | Admitting: Infectious Diseases

## 2018-06-13 DIAGNOSIS — I120 Hypertensive chronic kidney disease with stage 5 chronic kidney disease or end stage renal disease: Secondary | ICD-10-CM | POA: Diagnosis not present

## 2018-06-13 DIAGNOSIS — Z21 Asymptomatic human immunodeficiency virus [HIV] infection status: Secondary | ICD-10-CM | POA: Diagnosis not present

## 2018-06-13 DIAGNOSIS — N186 End stage renal disease: Secondary | ICD-10-CM | POA: Diagnosis not present

## 2018-06-13 DIAGNOSIS — B957 Other staphylococcus as the cause of diseases classified elsewhere: Secondary | ICD-10-CM

## 2018-06-13 DIAGNOSIS — I330 Acute and subacute infective endocarditis: Principal | ICD-10-CM

## 2018-06-13 DIAGNOSIS — R569 Unspecified convulsions: Secondary | ICD-10-CM

## 2018-06-13 DIAGNOSIS — B1910 Unspecified viral hepatitis B without hepatic coma: Secondary | ICD-10-CM | POA: Diagnosis not present

## 2018-06-13 DIAGNOSIS — D631 Anemia in chronic kidney disease: Secondary | ICD-10-CM | POA: Diagnosis not present

## 2018-06-13 DIAGNOSIS — Z992 Dependence on renal dialysis: Secondary | ICD-10-CM | POA: Diagnosis not present

## 2018-06-13 DIAGNOSIS — N2581 Secondary hyperparathyroidism of renal origin: Secondary | ICD-10-CM | POA: Diagnosis not present

## 2018-06-13 DIAGNOSIS — D509 Iron deficiency anemia, unspecified: Secondary | ICD-10-CM | POA: Diagnosis not present

## 2018-06-13 DIAGNOSIS — I200 Unstable angina: Secondary | ICD-10-CM | POA: Diagnosis not present

## 2018-06-13 DIAGNOSIS — R7881 Bacteremia: Secondary | ICD-10-CM | POA: Diagnosis not present

## 2018-06-14 ENCOUNTER — Ambulatory Visit (INDEPENDENT_AMBULATORY_CARE_PROVIDER_SITE_OTHER): Payer: Medicare Other | Admitting: Sports Medicine

## 2018-06-14 VITALS — BP 200/78 | Ht 72.0 in | Wt 165.3 lb

## 2018-06-14 DIAGNOSIS — N2581 Secondary hyperparathyroidism of renal origin: Secondary | ICD-10-CM | POA: Diagnosis not present

## 2018-06-14 DIAGNOSIS — M545 Low back pain: Secondary | ICD-10-CM

## 2018-06-14 DIAGNOSIS — M5459 Other low back pain: Secondary | ICD-10-CM

## 2018-06-14 DIAGNOSIS — Z992 Dependence on renal dialysis: Secondary | ICD-10-CM | POA: Diagnosis not present

## 2018-06-14 DIAGNOSIS — D631 Anemia in chronic kidney disease: Secondary | ICD-10-CM | POA: Diagnosis not present

## 2018-06-14 DIAGNOSIS — D509 Iron deficiency anemia, unspecified: Secondary | ICD-10-CM | POA: Diagnosis not present

## 2018-06-14 DIAGNOSIS — I2510 Atherosclerotic heart disease of native coronary artery without angina pectoris: Secondary | ICD-10-CM

## 2018-06-14 DIAGNOSIS — N186 End stage renal disease: Secondary | ICD-10-CM | POA: Diagnosis not present

## 2018-06-14 MED ORDER — CYCLOBENZAPRINE HCL 10 MG PO TABS: 10.0000 mg | ORAL_TABLET | Freq: Every evening | ORAL | 0 refills | Status: DC | PRN

## 2018-06-14 NOTE — Progress Notes (Signed)
   HPI  CC: Low back pain  Appears a 62 year old male with complex medical history presents for low back pain.  He was previously seen on September 18 of this year and was given a intra-articular shoulder injection.  He states his shoulder pain has gone away at this point.  He states that since the pain in his shoulder is been gone he is noticed mid low back pain.  He states low back pain stays around the middle of his back, and is worse with side-to-side movements.  He states it wakes him up at nighttime sometimes with cramping.  He denies any radiating pain to his lower legs.  He denies any numbness and tingling.  He denies any weakness of his lower legs.  He denies any urinary incontinence.  He denies any recent fevers, chills, weight loss.  He states his pain is been present for many years.  He is currently tried oxycodone for pain relief, which helped somewhat.  He is also tried Tylenol 500 mg daily which does not seem to be helping his pain.  He has no recent trauma to the area.  See HPI and/or previous note for associated ROS.  Objective: BP (!) 200/78   Ht 6' (1.829 m)   Wt 165 lb 5.5 oz (75 kg)   BMI 22.42 kg/m  Gen: Right-Hand Dominant. NAD, well groomed, a/o x3, normal affect.  CV: Well-perfused. Warm.  Resp: Non-labored.  Neuro: Sensation intact throughout. No gross coordination deficits.  Gait: Ambulating with cane  Back exam: No erythema, warmth, swelling noted.  Tenderness palpation along the paraspinal muscles around T10-T12.  Full range of motion in forward flexion, extension of back, rotational directions.  He does have pain with all range of motion of his back.  Strength 5 out of 5 throughout lower extremity testing.  Negative straight leg raise bilaterally.  Negative FABER test, negative FADIR test.  Unable to tolerate stork test.  Brief neuro exam: Sensation intact throughout exam.  Assessment and plan: Mechanical low back pain  I discussed treatment options today  with Jeffrey [name] regarding his chronic low back pain.  We discussed this is likely musculoskeletal in nature, and he is limited in the medications he can give for this pain relief.  He is unable to take NSAIDs due to chronic kidney disease.  We will give him some Flexeril at this time for nighttime use only.  I did discuss with him that this could make him sleepy if he takes during the day.  I also discussed he should discuss his PCP about getting set up with chronic pain management.  He has multiple comorbidities and needs a specialist to manage his long-term pain needs.  I will follow-up in 6 weeks regarding his shoulder, which seems to be doing better at this point.   Lewanda Rife, MD [facility] Fellow 06/14/2018 11:05 AM

## 2018-06-15 ENCOUNTER — Telehealth: Payer: Self-pay | Admitting: *Deleted

## 2018-06-15 ENCOUNTER — Ambulatory Visit: Payer: Medicare Other | Admitting: Infectious Diseases

## 2018-06-15 DIAGNOSIS — Z9181 History of falling: Secondary | ICD-10-CM | POA: Diagnosis not present

## 2018-06-15 DIAGNOSIS — E46 Unspecified protein-calorie malnutrition: Secondary | ICD-10-CM | POA: Diagnosis not present

## 2018-06-15 DIAGNOSIS — F445 Conversion disorder with seizures or convulsions: Secondary | ICD-10-CM | POA: Diagnosis not present

## 2018-06-15 DIAGNOSIS — N186 End stage renal disease: Secondary | ICD-10-CM | POA: Diagnosis not present

## 2018-06-15 DIAGNOSIS — Z992 Dependence on renal dialysis: Secondary | ICD-10-CM | POA: Diagnosis not present

## 2018-06-15 DIAGNOSIS — I120 Hypertensive chronic kidney disease with stage 5 chronic kidney disease or end stage renal disease: Secondary | ICD-10-CM | POA: Diagnosis not present

## 2018-06-15 DIAGNOSIS — I2510 Atherosclerotic heart disease of native coronary artery without angina pectoris: Secondary | ICD-10-CM | POA: Diagnosis not present

## 2018-06-15 DIAGNOSIS — B20 Human immunodeficiency virus [HIV] disease: Secondary | ICD-10-CM | POA: Diagnosis not present

## 2018-06-15 DIAGNOSIS — D631 Anemia in chronic kidney disease: Secondary | ICD-10-CM | POA: Diagnosis not present

## 2018-06-15 DIAGNOSIS — N2581 Secondary hyperparathyroidism of renal origin: Secondary | ICD-10-CM | POA: Diagnosis not present

## 2018-06-15 DIAGNOSIS — Z952 Presence of prosthetic heart valve: Secondary | ICD-10-CM | POA: Diagnosis not present

## 2018-06-15 NOTE — Telephone Encounter (Signed)
WALK IN Pt's brother [name] presents to triage this am, worried about pt's meds and being to sedate at times. He would like his med list reviewed and the oxy, hydrocodone removed and a referral to dr Maudie Mercury for med review

## 2018-06-16 DIAGNOSIS — I2510 Atherosclerotic heart disease of native coronary artery without angina pectoris: Secondary | ICD-10-CM | POA: Diagnosis not present

## 2018-06-16 DIAGNOSIS — I120 Hypertensive chronic kidney disease with stage 5 chronic kidney disease or end stage renal disease: Secondary | ICD-10-CM | POA: Diagnosis not present

## 2018-06-16 DIAGNOSIS — D509 Iron deficiency anemia, unspecified: Secondary | ICD-10-CM | POA: Diagnosis not present

## 2018-06-16 DIAGNOSIS — N2581 Secondary hyperparathyroidism of renal origin: Secondary | ICD-10-CM | POA: Diagnosis not present

## 2018-06-16 DIAGNOSIS — N186 End stage renal disease: Secondary | ICD-10-CM | POA: Diagnosis not present

## 2018-06-16 DIAGNOSIS — D631 Anemia in chronic kidney disease: Secondary | ICD-10-CM | POA: Diagnosis not present

## 2018-06-16 DIAGNOSIS — E46 Unspecified protein-calorie malnutrition: Secondary | ICD-10-CM | POA: Diagnosis not present

## 2018-06-16 DIAGNOSIS — Z992 Dependence on renal dialysis: Secondary | ICD-10-CM | POA: Diagnosis not present

## 2018-06-16 NOTE — Telephone Encounter (Signed)
Brother called this am, states PT just left and is very concerned about all the meds the pt is prescribed, desires appt, given for 9/30 at 1345 dr guilloud's schedule

## 2018-06-16 NOTE — Telephone Encounter (Signed)
Thank you Helen 

## 2018-06-17 ENCOUNTER — Other Ambulatory Visit: Payer: Self-pay

## 2018-06-17 NOTE — Patient Outreach (Signed)
[facility]) Care Management  06/17/2018  Jeffrey [name] 13-Mar-1956 [id number]   62 year old with recent admission 8/11-8/16 with ESRD/HD, Bacteremia, MRSE aortic valve.4 admissions in the past 6 months and 7 ED visits in the past 6 months.  8/11-8/16 ESRD, Bacteremia, MRSE aortic valve endocarditis 05/27/18 ED presentation SOB 06/02/18 ED presentation chest pain  06/03/18 ED presentation Fall, back pain-did not stay 06/04/18 ED presentation Fall, lumbar strain  Numerous attempts to reach client and to complete assessment. RNCM called today, but no answer. HIPPA compliant message left. Outreach letter sent.  Plan: follow up call within the next 3-4 business days.  [name], RN, MSN, [place] Coordinator Cell: [phone]

## 2018-06-17 NOTE — Procedures (Signed)
   HISTORY: 63 year old male, with history of seizure, end-stage renal disease.  TECHNIQUE:  16 channel EEG was performed based on standard 10-16 international system. One channel was dedicated to EKG.  Upon awakening, the posterior background activity was dysrhythmic, irregular, 5 Hz, reactive to eye opening and closure.  There was also intermittent generalized higher amplitude delta range activity.  Photic stimulation was performed, there was continued background slowing, but there was no epileptiform discharge.  Hyperventilation was not performed.  No sleep was achieved.  CONCLUSION: This is an abnormal awake EEG.  There is electrodiagnostic evidence of generalized slowing, indicating bihemisphere malfunction, common etiology are metabolic toxic.  There is no evidence of epileptiform discharge.  Marcial Pacas, M.D. Ph.D.  [facility] [place], [place] [address] Phone: [phone] Fax:      [phone]

## 2018-06-17 NOTE — Telephone Encounter (Signed)
Opened in Error.

## 2018-06-17 NOTE — Telephone Encounter (Signed)
Thank you, Bonnita Nasuti! I will follow up with patient

## 2018-06-20 ENCOUNTER — Encounter: Payer: Self-pay | Admitting: Internal Medicine

## 2018-06-20 ENCOUNTER — Other Ambulatory Visit: Payer: Self-pay

## 2018-06-20 ENCOUNTER — Ambulatory Visit (INDEPENDENT_AMBULATORY_CARE_PROVIDER_SITE_OTHER): Payer: Medicare Other | Admitting: Internal Medicine

## 2018-06-20 ENCOUNTER — Telehealth: Payer: Self-pay | Admitting: *Deleted

## 2018-06-20 VITALS — BP 165/72 | HR 66 | Temp 98.6°F | Ht 72.0 in | Wt 173.0 lb

## 2018-06-20 DIAGNOSIS — I10 Essential (primary) hypertension: Secondary | ICD-10-CM | POA: Diagnosis not present

## 2018-06-20 DIAGNOSIS — Z8619 Personal history of other infectious and parasitic diseases: Secondary | ICD-10-CM

## 2018-06-20 DIAGNOSIS — L299 Pruritus, unspecified: Secondary | ICD-10-CM

## 2018-06-20 DIAGNOSIS — L298 Other pruritus: Secondary | ICD-10-CM

## 2018-06-20 DIAGNOSIS — N186 End stage renal disease: Secondary | ICD-10-CM | POA: Diagnosis not present

## 2018-06-20 DIAGNOSIS — B957 Other staphylococcus as the cause of diseases classified elsewhere: Secondary | ICD-10-CM

## 2018-06-20 DIAGNOSIS — I120 Hypertensive chronic kidney disease with stage 5 chronic kidney disease or end stage renal disease: Secondary | ICD-10-CM

## 2018-06-20 DIAGNOSIS — Z8679 Personal history of other diseases of the circulatory system: Secondary | ICD-10-CM

## 2018-06-20 DIAGNOSIS — Z79899 Other long term (current) drug therapy: Secondary | ICD-10-CM

## 2018-06-20 DIAGNOSIS — I330 Acute and subacute infective endocarditis: Secondary | ICD-10-CM

## 2018-06-20 DIAGNOSIS — D509 Iron deficiency anemia, unspecified: Secondary | ICD-10-CM | POA: Diagnosis not present

## 2018-06-20 DIAGNOSIS — Z87891 Personal history of nicotine dependence: Secondary | ICD-10-CM | POA: Diagnosis not present

## 2018-06-20 DIAGNOSIS — Z992 Dependence on renal dialysis: Secondary | ICD-10-CM

## 2018-06-20 DIAGNOSIS — I351 Nonrheumatic aortic (valve) insufficiency: Secondary | ICD-10-CM | POA: Diagnosis not present

## 2018-06-20 DIAGNOSIS — N2581 Secondary hyperparathyroidism of renal origin: Secondary | ICD-10-CM | POA: Diagnosis not present

## 2018-06-20 DIAGNOSIS — D631 Anemia in chronic kidney disease: Secondary | ICD-10-CM | POA: Diagnosis not present

## 2018-06-20 MED ORDER — CLONIDINE 0.3 MG/24HR TD PTWK: 0.3000 mg | MEDICATED_PATCH | TRANSDERMAL | 6 refills | Status: DC

## 2018-06-20 MED ORDER — HYDROXYZINE HCL 25 MG PO TABS: 25.0000 mg | ORAL_TABLET | Freq: Four times a day (QID) | ORAL | 2 refills | Status: DC | PRN

## 2018-06-20 MED ORDER — CARVEDILOL 25 MG PO TABS: 25.0000 mg | ORAL_TABLET | Freq: Two times a day (BID) | ORAL | 5 refills | Status: DC

## 2018-06-20 NOTE — Patient Instructions (Addendum)
Jeffrey [name],  It was a pleasure to see you today. I have increased your clonidine to 0.3 mg weekly patches. Please call me if you have any problems with this increased dose. I have also increased your hydroxyzine to 25 mg as needed for itching. Please give your cardiothoracic surgeon a call to schedule a follow up appointment. Follow up with me again in 3 months, or sooner if you have any issues. If you have any questions or concerns, call our clinic at [phone] or after hours call [phone] and ask for the internal medicine resident on call. Thank you!  Dr. Philipp Ovens

## 2018-06-20 NOTE — Patient Outreach (Signed)
[facility]) Care Management  06/20/2018  Jeffrey [name] [birth year] [id number]   62 year old with recent admission 8/11-8/16 with ESRD/HD, Bacteremia, MRSE aortic valve.4 admissions in the past 6 months and 7 ED visits in the past 6 months.  8/11-8/16 ESRD, Bacteremia, MRSE aortic valve endocarditis 05/27/18 ED presentation SOB 06/02/18 ED presentation chest pain  06/03/18 ED presentation Fall, back pain-did not stay 06/04/18 ED presentation Fall, lumbar strain  Numerous attempts to reach client and to complete assessment. RNCM called today, but no answer. HIPPA compliant message left. Outreach letter sent on 06/17/18.  Plan: await return call. If no response within 10 business days RNCM will close case.  Jeffrey Silversmith, RN, MSN, [place] Coordinator Cell: [phone]

## 2018-06-20 NOTE — Progress Notes (Signed)
   CC: Blood pressure follow up  HPI:  Jeffrey [name] is a 62 y.o. male with past medical history outlined below here for blood pressure follow up. For the details of today's visit, please refer to the assessment and plan.  Past Medical History:  Diagnosis Date  . Anemia   . Anxiety   . Arthritis   . Asthma    per pt hx  . End stage renal disease on home HD 07/10/2011   Started HD in September 2012 at [facility] with a tunneled HD catheter, now on home HD with NxtStage. Dialyzing through AVF L lower arm with buttonhole technique as of mid 2014. His brother does the HD treatments at home.  They are roommates for 23 years.  The brother works 3rd shift and gets off about 8am and then puts Mr Limas on HD in the morning after getting home. Most of the time he does HD about 4 times a week, for about 4 hours per treatment. Cause of ESRD was HTN according to patient. He says he let his health go and ending up with complications, and that he didn't like seeing doctors in those days.  He says he was diagnosed with severe HTN when he lived in [place] in his 12's.   . Hepatitis B carrier ([place])   . HIV infection ([place])   . Hypertension   . Hyperthyroidism    normal now  . Pneumonia several yrs ago  . Seizure ([place])   . Seizures ([place]) 06/02/2011   x 1 none since  . Thrombocytopenia ([place])     Review of Systems  Constitutional: Negative for chills and fever.    Physical Exam:  Vitals:   06/20/18 1407  BP: (!) 165/72  Pulse: 66  Temp: 98.6 F (37 C)  TempSrc: Oral  SpO2: 100%  Weight: 173 lb (78.5 kg)  Height: 6' (1.829 m)    Constitutional: Chronically ill appearing  Cardiovascular: RRR, no murmurs, rubs, or gallops.  Pulmonary/Chest: CTAB, no wheezes, rales, or rhonchi.  Extremities: Warm and well perfused.  No edema. LUE fistula with palpable thrill  Psychiatric: Normal mood and affect  Assessment & Plan:   See Encounters Tab for problem based charting.  Patient discussed with  Dr. Daryll Drown

## 2018-06-20 NOTE — Telephone Encounter (Signed)
Brother wants added to chart that pt has [facility] PT tuesdays and thursdays

## 2018-06-21 ENCOUNTER — Encounter: Payer: Self-pay | Admitting: Internal Medicine

## 2018-06-21 DIAGNOSIS — Z992 Dependence on renal dialysis: Secondary | ICD-10-CM | POA: Diagnosis not present

## 2018-06-21 DIAGNOSIS — N186 End stage renal disease: Secondary | ICD-10-CM | POA: Diagnosis not present

## 2018-06-21 DIAGNOSIS — D631 Anemia in chronic kidney disease: Secondary | ICD-10-CM | POA: Diagnosis not present

## 2018-06-21 NOTE — Assessment & Plan Note (Signed)
Blood pressure is chronically uncontrolled. Currently taking hydralazine 100 mg TID, Coreg 25 mg BID, and clonidine patch 0.2 mg qweekly.  -- Increase clonidine patch 0.3 mg weekly

## 2018-06-21 NOTE — Assessment & Plan Note (Signed)
Patient has a history of uremic pruritus. Prescribed hydroxyzine 10 mg BID prn at his last visit. He reports partial relief and is requesting and increase in dose.  -- Increase hydroxyzine to 25 mg TID prn (max dose with renal dysfunction)

## 2018-06-21 NOTE — Assessment & Plan Note (Addendum)
Patient has severe aortic valve insufficiency due to recurrent bacterial endocarditis. He is finishing his last dose of vancomycin this week. Following with CT surgery and infectious disease. Patient is hopeful for valve repair. No follow up currently scheduled. Instructed patient to call for appointment.

## 2018-06-22 DIAGNOSIS — I120 Hypertensive chronic kidney disease with stage 5 chronic kidney disease or end stage renal disease: Secondary | ICD-10-CM | POA: Diagnosis not present

## 2018-06-22 DIAGNOSIS — I2510 Atherosclerotic heart disease of native coronary artery without angina pectoris: Secondary | ICD-10-CM | POA: Diagnosis not present

## 2018-06-22 DIAGNOSIS — E46 Unspecified protein-calorie malnutrition: Secondary | ICD-10-CM | POA: Diagnosis not present

## 2018-06-22 DIAGNOSIS — N186 End stage renal disease: Secondary | ICD-10-CM | POA: Diagnosis not present

## 2018-06-22 DIAGNOSIS — D631 Anemia in chronic kidney disease: Secondary | ICD-10-CM | POA: Diagnosis not present

## 2018-06-22 DIAGNOSIS — N2581 Secondary hyperparathyroidism of renal origin: Secondary | ICD-10-CM | POA: Diagnosis not present

## 2018-06-22 NOTE — Progress Notes (Signed)
Internal Medicine Clinic Attending  Case discussed with Dr. Guilloud at the time of the visit.  We reviewed the resident's history and exam and pertinent patient test results.  I agree with the assessment, diagnosis, and plan of care documented in the resident's note.  

## 2018-06-23 DIAGNOSIS — D631 Anemia in chronic kidney disease: Secondary | ICD-10-CM | POA: Diagnosis not present

## 2018-06-23 DIAGNOSIS — N186 End stage renal disease: Secondary | ICD-10-CM | POA: Diagnosis not present

## 2018-06-23 DIAGNOSIS — Z992 Dependence on renal dialysis: Secondary | ICD-10-CM | POA: Diagnosis not present

## 2018-06-24 ENCOUNTER — Other Ambulatory Visit: Payer: Self-pay | Admitting: Infectious Diseases

## 2018-06-24 DIAGNOSIS — F419 Anxiety disorder, unspecified: Secondary | ICD-10-CM

## 2018-06-27 DIAGNOSIS — Z4659 Encounter for fitting and adjustment of other gastrointestinal appliance and device: Secondary | ICD-10-CM | POA: Diagnosis not present

## 2018-06-27 DIAGNOSIS — Z880 Allergy status to penicillin: Secondary | ICD-10-CM | POA: Diagnosis not present

## 2018-06-27 DIAGNOSIS — I252 Old myocardial infarction: Secondary | ICD-10-CM | POA: Diagnosis not present

## 2018-06-27 DIAGNOSIS — I120 Hypertensive chronic kidney disease with stage 5 chronic kidney disease or end stage renal disease: Secondary | ICD-10-CM | POA: Diagnosis not present

## 2018-06-27 DIAGNOSIS — D132 Benign neoplasm of duodenum: Secondary | ICD-10-CM | POA: Diagnosis not present

## 2018-06-27 DIAGNOSIS — N186 End stage renal disease: Secondary | ICD-10-CM | POA: Diagnosis not present

## 2018-06-27 DIAGNOSIS — D135 Benign neoplasm of extrahepatic bile ducts: Secondary | ICD-10-CM | POA: Diagnosis not present

## 2018-06-27 DIAGNOSIS — K3189 Other diseases of stomach and duodenum: Secondary | ICD-10-CM | POA: Diagnosis not present

## 2018-06-27 DIAGNOSIS — Z992 Dependence on renal dialysis: Secondary | ICD-10-CM | POA: Diagnosis not present

## 2018-06-27 DIAGNOSIS — D631 Anemia in chronic kidney disease: Secondary | ICD-10-CM | POA: Diagnosis not present

## 2018-06-28 ENCOUNTER — Other Ambulatory Visit: Payer: Self-pay | Admitting: *Deleted

## 2018-06-28 ENCOUNTER — Encounter: Payer: Self-pay | Admitting: Thoracic Surgery (Cardiothoracic Vascular Surgery)

## 2018-06-28 ENCOUNTER — Other Ambulatory Visit: Payer: Self-pay

## 2018-06-28 ENCOUNTER — Ambulatory Visit (INDEPENDENT_AMBULATORY_CARE_PROVIDER_SITE_OTHER): Payer: Medicare Other | Admitting: Thoracic Surgery (Cardiothoracic Vascular Surgery)

## 2018-06-28 ENCOUNTER — Ambulatory Visit (INDEPENDENT_AMBULATORY_CARE_PROVIDER_SITE_OTHER): Payer: Medicare Other | Admitting: Pharmacist

## 2018-06-28 ENCOUNTER — Encounter: Payer: Self-pay | Admitting: Pharmacist

## 2018-06-28 VITALS — BP 152/61 | HR 62 | Resp 16 | Ht 72.0 in | Wt 172.0 lb

## 2018-06-28 DIAGNOSIS — Z992 Dependence on renal dialysis: Secondary | ICD-10-CM | POA: Diagnosis not present

## 2018-06-28 DIAGNOSIS — I351 Nonrheumatic aortic (valve) insufficiency: Secondary | ICD-10-CM | POA: Diagnosis not present

## 2018-06-28 DIAGNOSIS — I2510 Atherosclerotic heart disease of native coronary artery without angina pectoris: Secondary | ICD-10-CM | POA: Diagnosis not present

## 2018-06-28 DIAGNOSIS — D631 Anemia in chronic kidney disease: Secondary | ICD-10-CM | POA: Diagnosis not present

## 2018-06-28 DIAGNOSIS — Z79899 Other long term (current) drug therapy: Secondary | ICD-10-CM | POA: Diagnosis not present

## 2018-06-28 DIAGNOSIS — N186 End stage renal disease: Secondary | ICD-10-CM | POA: Diagnosis not present

## 2018-06-28 NOTE — Progress Notes (Signed)
S: Jeffrey [name] is a 62 y.o. male reports to clinical pharmacist appointment for medication management. Patient did not bring medication bottles. Patient is accompanied by family, who assist at home with medication management.  Patient and family member explain the reason for visit is due to noticing increased tiredness and decreased ability to come to thoughts easily. Patient states he noticed this started occurring after being started on Vimpat after occurrence of a seizure.   Allergies  Allergen Reactions  . Lisinopril Anaphylaxis and Shortness Of Breath    Throat swelling  . Penicillins Anaphylaxis and Other (See Comments)    Childhood allergy Has patient had a PCN reaction causing immediate rash, facial/tongue/throat swelling, SOB or lightheadedness with hypotension: Yes Has patient had a PCN reaction causing severe rash involving mucus membranes or skin necrosis: Unk Has patient had a PCN reaction that required hospitalization: Unk Has patient had a PCN reaction occurring within the last 10 years: No If all of the above answers are "NO", then may proceed with Cephalosporin use.   . Fentanyl Other (See Comments)    Lethargy, AMS  . Morphine And Related Other (See Comments)    "Not Himself"   Prior to Admission medications   Medication Sig Start Date End Date Taking? Authorizing Provider  albuterol (PROVENTIL) (2.5 MG/3ML) 0.083% nebulizer solution Take 2.5 mg by nebulization every 6 (six) hours as needed for wheezing or shortness of breath.    [provider]  bictegravir-emtricitabine-tenofovir AF (BIKTARVY) 50-200-25 MG TABS tablet Take 1 tablet by mouth daily. 12/27/17   Campbell Riches, MD  calcium acetate (PHOSLO) 667 MG capsule Take 667-2,001 mg by mouth See admin instructions. Take 1,334 mg three times a day with each meal and 667 mg with each snack 06/06/15   [provider]  carvedilol (COREG) 25 MG tablet Take 1 tablet (25 mg total) by mouth 2 (two) times  daily with a meal. 06/20/18   Velna Ochs, MD  cloNIDine (CATAPRES - DOSED IN MG/24 HR) 0.3 mg/24hr patch Place 1 patch (0.3 mg total) onto the skin once a week. 06/20/18   Velna Ochs, MD  clotrimazole-betamethasone (LOTRISONE) cream Apply 1 application topically 2 (two) times daily. 09/08/17   Campbell Riches, MD  cyclobenzaprine (FLEXERIL) 10 MG tablet Take 1 tablet (10 mg total) by mouth at bedtime as needed for muscle spasms. 06/14/18   Candice Camp, MD  epoetin alfa (EPOGEN,PROCRIT) 2000 UNIT/ML injection Inject 2,000 Units into the vein 3 (three) times a week.     [provider]  ethyl chloride spray Apply 1 application topically daily as needed (HD- to numb).  05/20/15   [provider]  heparin 1000 UNIT/ML injection Inject 3,000 Units into the vein 4 (four) times a week.     [provider]  hydrALAZINE (APRESOLINE) 100 MG tablet TAKE 1 TABLET(100 MG) BY MOUTH THREE TIMES DAILY 06/13/18   Aldine Contes, MD  hydrOXYzine (ATARAX/VISTARIL) 25 MG tablet Take 1 tablet (25 mg total) by mouth 4 (four) times daily as needed for itching. 06/20/18   Velna Ochs, MD  Ipratropium-Albuterol (COMBIVENT IN) Inhale 2 puffs into the lungs every 6 (six) hours as needed (as needed for shortness of breath).     [provider]  iron sucrose (VENOFER) 20 MG/ML injection Inject 100 mg into the vein every Monday.     [provider]  Lacosamide (VIMPAT) 100 MG TABS Take 1 tablet (100 mg total) by mouth 2 (two) times daily.  06/06/18   Marcial Pacas, MD  lacosamide (VIMPAT) 50 MG TABS tablet Take one half pill (25 mg) after hemodialysis 05/09/18   Bartholomew Crews, MD  megestrol (MEGACE) 40 MG tablet Take 2 tablets (80 mg total) by mouth 2 (two) times daily. Patient not taking: Reported on 06/06/2018 02/28/18   Kathi Ludwig, MD  multivitamin (RENA-VIT) TABS tablet Take 1 tablet by mouth daily.      [provider]  omeprazole (PRILOSEC)  20 MG capsule Take 1 capsule (20 mg total) by mouth daily. 01/31/18   [name], MD  traZODone (DESYREL) 50 MG tablet TAKE 1 TABLET(50 MG) BY MOUTH AT BEDTIME AS NEEDED FOR SLEEP 06/24/18   Campbell Riches, MD  VOLTAREN 1 % GEL APPLY 2 GRAMS EXTERNALLY TO THE AFFECTED AREA FOUR TIMES DAILY 05/09/18   Jean Rosenthal, MD   Past Medical History:  Diagnosis Date  . Anemia   . Anxiety   . Arthritis   . Asthma    per pt hx  . End stage renal disease on home HD 07/10/2011   Started HD in September 2012 at [facility] with a tunneled HD catheter, now on home HD with NxtStage. Dialyzing through AVF L lower arm with buttonhole technique as of mid 2014. His brother does the HD treatments at home.  They are roommates for 23 years.  The brother works 3rd shift and gets off about 8am and then puts Mr Kincaid on HD in the morning after getting home. Most of the time he does HD about 4 times a week, for about 4 hours per treatment. Cause of ESRD was HTN according to patient. He says he let his health go and ending up with complications, and that he didn't like seeing doctors in those days.  He says he was diagnosed with severe HTN when he lived in [place] in his 41's.   . Hepatitis B carrier ([place])   . HIV infection ([place])   . Hypertension   . Hyperthyroidism    normal now  . Pneumonia several yrs ago  . Seizure ([place])   . Seizures ([place]) 06/02/2011   x 1 none since  . Thrombocytopenia ([place])    Social History   Socioeconomic History  . Marital status: Single    Spouse name: Not on file  . Number of children: Not on file  . Years of education: Not on file  . Highest education level: Not on file  Occupational History  . Not on file  Social Needs  . Financial resource strain: Not on file  . Food insecurity:    Worry: Not on file    Inability: Not on file  . Transportation needs:    Medical: Not on file    Non-medical: Not on file  Tobacco Use  . Smoking status: Former Smoker    Years: 10.00    Types:  Cigarettes    Last attempt to quit: 08/08/2015    Years since quitting: 2.8  . Smokeless tobacco: Never Used  . Tobacco comment: casual smoking for 10 years  Substance and Sexual Activity  . Alcohol use: No    Alcohol/week: 0.0 standard drinks    Comment: quit 2004  . Drug use: No    Comment: uds (+) cocaine in 08/2011 but pt states was taking sudafed at the time  . Sexual activity: Not Currently    Partners: Male  Lifestyle  . Physical activity:    Days per week: Not on file  Minutes per session: Not on file  . Stress: Not on file  Relationships  . Social connections:    Talks on phone: Not on file    Gets together: Not on file    Attends religious service: Not on file    Active member of club or organization: Not on file    Attends meetings of clubs or organizations: Not on file    Relationship status: Not on file  Other Topics Concern  . 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 Hypertension Mother   . Diabetes Mother   . Cancer Father     O:    Component Value Date/Time   CHOL 120 11/01/2017 1008   HDL 29 (L) 11/01/2017 1008   TRIG 106 11/01/2017 1008   AST 17 06/04/2018 1007   AST 15 11/18/2016 1036   ALT 9 06/04/2018 1007   ALT <6 11/18/2016 1036   NA 133 (L) 06/04/2018 1007   NA 136 11/18/2016 1036   K 3.3 (L) 06/04/2018 1007   K 3.3 (L) 11/18/2016 1036   CL 97 (L) 06/04/2018 1007   CO2 24 06/04/2018 1007   CO2 29 11/18/2016 1036   GLUCOSE 125 (H) 06/04/2018 1007   GLUCOSE 95 11/18/2016 1036   HGBA1C 5.3 06/03/2011 0545   BUN 40 (H) 06/04/2018 1007   BUN 25.7 11/18/2016 1036   CREATININE 10.30 (H) 06/04/2018 1007   CREATININE 10.64 (H) 11/01/2017 1008   CREATININE 6.6 (HH) 11/18/2016 1036   CALCIUM 8.6 (L) 06/04/2018 1007    CALCIUM 9.5 11/18/2016 1036   GFRNONAA 5 (L) 06/04/2018 1007   GFRNONAA 3 (L) 02/10/2017 1619   GFRAA 5 (L) 06/04/2018 1007   GFRAA <4 (L) 02/10/2017 1619   WBC 7.2 06/04/2018 1007   HGB 10.3 (L) 06/04/2018 1007   HGB 9.7 (L) 05/09/2018 0937   HGB 10.0 (L) 11/18/2016 1036   HCT 33.7 (L) 06/04/2018 1007   HCT 29.8 (L) 05/09/2018 0937   HCT 29.9 (L) 11/18/2016 1036   PLT 148 (L) 06/04/2018 1007   PLT 180 05/09/2018 0937   TSH 3.325 10/14/2017 0425   TSH 1.465 06/03/2011 0545   Ht Readings from Last 2 Encounters:  06/20/18 6' (1.829 m)  06/14/18 6' (1.829 m)   Wt Readings from Last 2 Encounters:  06/20/18 173 lb (78.5 kg)  06/14/18 165 lb 5.5 oz (75 kg)   There is no height or weight on file to calculate BMI. BP Readings from Last 3 Encounters:  06/20/18 (!) 165/72  06/14/18 (!) 200/78  06/08/18 (!) 199/68     A/P: A drug regimen assessment was performed, including review of allergies, interactions, disease-state management, and dosing. Medications were reviewed with the patient, including name, instructions, indication, goals of therapy, potential side effects, importance of adherence, and safe use.  Findings/Recommendations:  -Patient is on several medications that could potentially cause symptoms patient is experiencing. Patient has also had some seizures and was placed on Vimpat which could also be contributing to his symptoms.  -Advised patient to speak with neurologist about symptoms he is experiencing at next appointment.  -Consider discontinuing flexeril to help decrease sedation   The patient verbalized understanding of information provided by  repeating back concepts discussed.   30 minutes spent face-to-face with the patient during the encounter. 50% of time spent on education. 50% of time was spent on reviewing medication regimen.   Gwenlyn Found, Sherian Rein D PGY1 Pharmacy Resident  Phone [phone] 06/30/2018   3:41 PM

## 2018-06-28 NOTE — H&P (View-Only) (Signed)
[address]       [place], [address]             [phone]     HPI: Mr. Wisham returns to discuss aortic valve replacement.  Kjuan Seipp is a 62 year old gentleman with a history of end-stage renal disease on hemodialysis, HIV, hepatitis B, aortic valve endocarditis, tubulovillous adenoma of duodenum, hypertension, seizures, hypothyroidism, peripheral arterial disease, and thrombocytopenia.  He was found to have staph bacteremia in November 2017.  In December he was noted to have aortic insufficiency.  He had an echocardiogram which showed severe aortic insufficiency and was referred for surgery.  He was not interested in surgery at that time came back in June 2018.  He wanted to pursue surgery at that point.  A repeat echo was done and showed only moderate aortic insufficiency.  He also had a catheterization which showed a 35 to 40% left main stenosis.  We elected to observe him at that point.  He then did well until this summer.  He presented with shortness of breath and was admitted to the hospital in August.  He had methicillin-resistant staph epidermidis bacteremia.  Transesophageal echocardiography showed a small vegetation and severe aortic insufficiency.  He was treated with vancomycin.  I saw him in September and recommended that he complete his antibiotics prior to surgery.  He is now completed his antibiotics.  He has been afebrile.  He still gets shortness of breath with mild exertion.  He has been able to complete his dialysis sessions.  He is not having any peripheral edema.  He has an appointment with vascular surgery tomorrow to check 1 of his dialysis access sites.  Past Medical History:  Diagnosis Date  . Anemia   . Anxiety   . Arthritis   . Asthma    per pt hx  . End stage renal disease on home HD 07/10/2011   Started HD in September 2012 at [facility] with a tunneled HD catheter, now on home HD with NxtStage. Dialyzing through AVF L lower arm with  buttonhole technique as of mid 2014. His brother does the HD treatments at home.  They are roommates for 23 years.  The brother works 3rd shift and gets off about 8am and then puts Mr Dolinsky on HD in the morning after getting home. Most of the time he does HD about 4 times a week, for about 4 hours per treatment. Cause of ESRD was HTN according to patient. He says he let his health go and ending up with complications, and that he didn't like seeing doctors in those days.  He says he was diagnosed with severe HTN when he lived in [place] in his 55's.   . Hepatitis B carrier ([place])   . HIV infection ([place])   . Hypertension   . Hyperthyroidism    normal now  . Pneumonia several yrs ago  . Seizure ([place])   . Seizures ([place]) 06/02/2011   x 1 none since  . Thrombocytopenia ([place])    Past Surgical History:  Procedure Laterality Date  . AV FISTULA PLACEMENT  06/02/11   Left radiocephalic AVF  . BIOPSY  02/17/2018   Procedure: BIOPSY;  Surgeon: Milus Banister, MD;  Location: [facility] ENDOSCOPY;  Service: Endoscopy;;  . COLONOSCOPY    . COLONOSCOPY WITH PROPOFOL N/A 01/27/2018   Procedure: COLONOSCOPY WITH PROPOFOL;  Surgeon: [name], MD;  Location: [facility] ENDOSCOPY;  Service: Gastroenterology;  Laterality: N/A;  .  CORONARY ANGIOPLASTY    . ERCP  03/21/2018   AT [place]  . ESOPHAGOGASTRODUODENOSCOPY N/A 02/17/2018   Procedure: ESOPHAGOGASTRODUODENOSCOPY (EGD);  Surgeon: Milus Banister, MD;  Location: [facility] ENDOSCOPY;  Service: Endoscopy;  Laterality: N/A;  . ESOPHAGOGASTRODUODENOSCOPY (EGD) WITH PROPOFOL N/A 01/27/2018   Procedure: ESOPHAGOGASTRODUODENOSCOPY (EGD) WITH PROPOFOL;  Surgeon: [name], MD;  Location: [facility] ENDOSCOPY;  Service: Gastroenterology;  Laterality: N/A;  . ESOPHAGOGASTRODUODENOSCOPY (EGD) WITH PROPOFOL N/A 04/03/2018   Procedure: ESOPHAGOGASTRODUODENOSCOPY (EGD) WITH PROPOFOL;  Surgeon: Clarene Essex, MD;  Location: [place];  Service: Endoscopy;  Laterality: N/A;  . EUS N/A  02/17/2018   Procedure: UPPER ENDOSCOPIC ULTRASOUND (EUS) RADIAL;  Surgeon: Milus Banister, MD;  Location: [facility] ENDOSCOPY;  Service: Endoscopy;  Laterality: N/A;  . fistulaogram     x 2 last 2 years  . IR DIALY SHUNT INTRO NEEDLE/INTRACATH INITIAL W/IMG LEFT Left 04/20/2018  . IR FLUORO GUIDE CV LINE RIGHT  09/16/2017  . IR US GUIDE VASC ACCESS RIGHT  09/16/2017  . IRRIGATION AND DEBRIDEMENT KNEE Left 09/15/2017   Procedure: IRRIGATION AND DEBRIDEMENT KNEE; arthroscopic clean out;  Surgeon: [name], MD;  Location: [facility] ORS;  Service: Orthopedics;  Laterality: Left;  . OTHER SURGICAL HISTORY     removal temporary HD catheter   . REVISON OF ARTERIOVENOUS FISTULA Left 10/10/2015   Procedure: REVISON OF LEFT RADIOCEPHALIC ARTERIOVENOUS FISTULA;  Surgeon: Angelia Mould, MD;  Location: [place];  Service: Vascular;  Laterality: Left;  . REVISON OF ARTERIOVENOUS FISTULA Left 02/07/2016   Procedure: REPAIR OF PSEUDO-ANEUREYSM OF LEFT ARM  ARTERIOVENOUS FISTULA;  Surgeon: Angelia Mould, MD;  Location: [place];  Service: Vascular;  Laterality: Left;  . REVISON OF ARTERIOVENOUS FISTULA Left 3/55/7322   Procedure: PLICATION OF LEFT ARM RADIOCEPHALIC ARTERIOVENOUS FISTULA PSEUDOANEURYSM;  Surgeon: Angelia Mould, MD;  Location: [place];  Service: Vascular;  Laterality: Left;  . RIGHT/LEFT HEART CATH AND CORONARY ANGIOGRAPHY N/A 02/17/2017   Procedure: Right/Left Heart Cath and Coronary Angiography;  Surgeon: Sherren Mocha, MD;  Location: [place] CV LAB;  Service: Cardiovascular;  Laterality: N/A;  . TEE WITHOUT CARDIOVERSION N/A 09/11/2016   Procedure: TRANSESOPHAGEAL ECHOCARDIOGRAM (TEE);  Surgeon: Dorothy Spark, MD;  Location: [place];  Service: Cardiovascular;  Laterality: N/A;  . TEE WITHOUT CARDIOVERSION N/A 05/05/2018   Procedure: TRANSESOPHAGEAL ECHOCARDIOGRAM (TEE);  Surgeon: Adrian Prows, MD;  Location: [place];  Service: Cardiovascular;  Laterality: N/A;  Prefer  after 3:30 PM     Current Outpatient Medications  Medication Sig Dispense Refill  . albuterol (PROVENTIL) (2.5 MG/3ML) 0.083% nebulizer solution Take 2.5 mg by nebulization every 6 (six) hours as needed for wheezing or shortness of breath.    . ALPRAZolam (XANAX) 0.25 MG tablet Take by mouth at bedtime as needed for anxiety.    . bictegravir-emtricitabine-tenofovir AF (BIKTARVY) 50-200-25 MG TABS tablet Take 1 tablet by mouth daily. 30 tablet 11  . calcium acetate (PHOSLO) 667 MG capsule Take 667-2,001 mg by mouth See admin instructions. Take 1,334 mg three times a day with each meal and 667 mg with each snack    . carvedilol (COREG) 25 MG tablet Take 1 tablet (25 mg total) by mouth 2 (two) times daily with a meal. 60 tablet 5  . cloNIDine (CATAPRES - DOSED IN MG/24 HR) 0.3 mg/24hr patch Place 1 patch (0.3 mg total) onto the skin once a week. 4 patch 6  . cyclobenzaprine (FLEXERIL) 10 MG tablet Take 1 tablet (10 mg total) by  mouth at bedtime as needed for muscle spasms. 30 tablet 0  . epoetin alfa (EPOGEN,PROCRIT) 2000 UNIT/ML injection Inject 2,000 Units into the vein 3 (three) times a week.     . ethyl chloride spray Apply 1 application topically daily as needed (HD- to numb).   12  . heparin 1000 UNIT/ML injection Inject 3,000 Units into the vein 4 (four) times a week.     . hydrALAZINE (APRESOLINE) 100 MG tablet TAKE 1 TABLET(100 MG) BY MOUTH THREE TIMES DAILY 270 tablet 1  . hydrOXYzine (ATARAX/VISTARIL) 25 MG tablet Take 1 tablet (25 mg total) by mouth 4 (four) times daily as needed for itching. 90 tablet 2  . Ipratropium-Albuterol (COMBIVENT IN) Inhale 2 puffs into the lungs every 6 (six) hours as needed (as needed for shortness of breath).     . iron sucrose (VENOFER) 20 MG/ML injection Inject 100 mg into the vein every Monday.     . Lacosamide (VIMPAT) 100 MG TABS Take 1 tablet (100 mg total) by mouth 2 (two) times daily. 60 tablet 11  . lacosamide (VIMPAT) 50 MG TABS tablet Take one  half pill (25 mg) after hemodialysis 30 tablet 5  . multivitamin (RENA-VIT) TABS tablet Take 1 tablet by mouth daily.      Marland Kitchen omeprazole (PRILOSEC) 20 MG capsule Take 1 capsule (20 mg total) by mouth daily. 90 capsule 3  . traZODone (DESYREL) 50 MG tablet TAKE 1 TABLET(50 MG) BY MOUTH AT BEDTIME AS NEEDED FOR SLEEP 30 tablet 0  . VOLTAREN 1 % GEL APPLY 2 GRAMS EXTERNALLY TO THE AFFECTED AREA FOUR TIMES DAILY 100 g 1   No current facility-administered medications for this visit.     Physical Exam BP (!) 152/61 (BP Location: Right Arm, Patient Position: Sitting, Cuff Size: Large)   Pulse 62   Resp 16   Ht 6' (1.829 m)   Wt 172 lb (78 kg)   SpO2 95% Comment: ON RA  BMI 23.63 kg/m  62 year old man in no acute distress Alert and oriented x3 with no focal deficits No carotid bruits Cardiac regular rate and rhythm with a 2/6 systolic and diastolic murmur Lungs clear with equal breath sounds bilaterally Abdomen soft and nontender Extremities are without clubbing cyanosis or edema Positive thrill in left arm fistula, dressing in place.  Diagnostic Tests: Transesophageal echocardiogram Study Conclusions  - Left ventricle: The LV apex is trabeculated and there is a false tendon in the LV apex. Findings suggestive of non compaction. The cavity size was mildly dilated. There was severe concentric hypertrophy. Systolic function was normal. The estimated ejection fraction was in the range of 55% to 60%. Wall motion was normal; there were no regional wall motion abnormalities. Features are consistent with a pseudonormal left ventricular filling pattern, with concomitant abnormal relaxation and increased filling pressure (grade 2 diastolic dysfunction). Doppler parameters are consistent with high ventricular filling pressure. - Aortic valve: There was moderate to severe regurgitation. Valve area (VTI): 3.43 cm^2. Valve area (Vmean): 3.45 cm^2. - Mitral valve: There was  mild regurgitation. - Left atrium: The atrium was severely dilated. - Right ventricle: The cavity size was mildly dilated. Wall thickness was normal. - Pulmonary arteries: PA peak pressure: 62 mm Hg (S). - Pericardium, extracardiac: A trivial pericardial effusion was identified posterior to the heart. There was a left pleural effusion.  Impressions:  - Normal LVF with severe LVH and mild LVE. There is a left pleural effusion and trivial pericardial effusion posteriorly. There is mild  MR and trivial PR. The AV is not well visualized but there appears to be moderate to severe AI of unclear etiology. Suggest TEE to further evaluated AV. Consider Cardiac MRI to evaluate for non compaction LV.  TEE: Under moderate sedation3 mg versed and 25 mg Benadryl, TEE was performed without complications:  [id number]. Mild LV dilatation. Severe LVH. RV: Normal LA: Normal. Left atrial appendage: Normal without thrombus. Normal function. Inter atrial septum is intact without defect. Double contrast studynot done. RA: Normal, Rudimentary Eustachian valve noted. MV: NormalMildMR. TV: Normal Trace TR QA:[id number]. The non-coronary cusp is perforated with resultant severe aortic regurgitation. No abscess noted. There is a small vegetation noted on the non and right coronary cusp. PV: Normal. Trace PI.  Thoracic and ascending aorta: Normal withvery mild mixed plaqueplaque in the descending thoracic aorta.  Conscious sedation protocol was followed, I personally administered conscious sedation and monitored the patient. Patient received58milligrams of Versed and 25 mg benadryl. Patient tolerated the procedure well and there was no complication from conscious sedation. Time administered was 21 minutes.        Electronically signed by Adrian Prows, MD at 05/05/2018 9:20 AM  I personally reviewed the transesophageal echocardiogram and concur with the findings noted  above  Impression: Mr. Favorite is a 62 year old gentleman with multiple significant medical problems including end-stage renal disease on hemodialysis, recurrent aortic valve endocarditis with severe aortic insufficiency, atherosclerotic cardiovascular disease, HIV, hepatitis B, tubulovillous adenoma of the duodenum, hypertension, hypothyroidism, seizures, and thrombocytopenia.  He has symptomatic severe aortic insufficiency.  Aortic valve replacement is indicated.  I discussed aortic valve replacement with Mr. Lengacher and his brother.  We reviewed the indications, risks, benefits, and alternatives.  They understand the general nature of the procedure including the need for general anesthesia, the incisions to be used, and the use of cardiopulmonary bypass.  We discussed the advantages and disadvantages of tissue versus mechanical valves.  I think a tissue valve is the best option for him although he may be at risk for early valve calcification.  They understand the risks of the procedure include, but not limited to death, MI, DVT, PE, stroke, bleeding, possible need for transfusion, infection, heart block requiring pacemaker, as well as respiratory or gastrointestinal complications.  He accepts his risks and wishes to proceed.  He had a catheterization in May 2018 which showed hemodynamically insignificant left main disease of 30 to 45%.  I do think he needs repeat catheterization prior to surgery.  Dr. Burt Knack did his previous catheterization.  I will check with him.  Dialysis access-he has had issues with some bleeding at access site.  He is scheduled to see vascular surgery tomorrow to address that.  Plan: Repeat cardiac catheterization Aortic valve replacement with tissue valve on Wednesday, July 20, 2018  [name], MD [facility] [phone]

## 2018-06-28 NOTE — Progress Notes (Signed)
[address]       [place] [address]             [phone]     HPI: Jeffrey [name] returns to discuss aortic valve replacement.  Jeffrey [name] is a 62 year old gentleman with a history of end-stage renal disease on hemodialysis, HIV, hepatitis B, aortic valve endocarditis, tubulovillous adenoma of duodenum, hypertension, seizures, hypothyroidism, peripheral arterial disease, and thrombocytopenia.  He was found to have staph bacteremia in November 2017.  In December he was noted to have aortic insufficiency.  He had an echocardiogram which showed severe aortic insufficiency and was referred for surgery.  He was not interested in surgery at that time came back in June 2018.  He wanted to pursue surgery at that point.  A repeat echo was done and showed only moderate aortic insufficiency.  He also had a catheterization which showed a 35 to 40% left main stenosis.  We elected to observe him at that point.  He then did well until this summer.  He presented with shortness of breath and was admitted to the hospital in August.  He had methicillin-resistant staph epidermidis bacteremia.  Transesophageal echocardiography showed a small vegetation and severe aortic insufficiency.  He was treated with vancomycin.  I saw him in September and recommended that he complete his antibiotics prior to surgery.  He is now completed his antibiotics.  He has been afebrile.  He still gets shortness of breath with mild exertion.  He has been able to complete his dialysis sessions.  He is not having any peripheral edema.  He has an appointment with vascular surgery tomorrow to check 1 of his dialysis access sites.  Past Medical History:  Diagnosis Date  . Anemia   . Anxiety   . Arthritis   . Asthma    per pt hx  . End stage renal disease on home HD 07/10/2011   Started HD in September 2012 at [facility] with a tunneled HD catheter, now on home HD with [facility]. Dialyzing through AVF L lower arm with  buttonhole technique as of mid 2014. His brother does the HD treatments at home.  They are roommates for 23 years.  The brother works 3rd shift and gets off about 8am and then puts Jeffrey [name] on HD in the morning after getting home. Most of the time he does HD about 4 times a week, for about 4 hours per treatment. Cause of ESRD was HTN according to patient. He says he let his health go and ending up with complications, and that he didn't like seeing doctors in those days.  He says he was diagnosed with severe HTN when he lived in [place] in his 41's.   . Hepatitis B carrier ([place])   . HIV infection ([place])   . Hypertension   . Hyperthyroidism    normal now  . Pneumonia several yrs ago  . Seizure ([place])   . Seizures ([place]) 06/02/2011   x 1 none since  . Thrombocytopenia ([place])    Past Surgical History:  Procedure Laterality Date  . AV FISTULA PLACEMENT  06/02/11   Left radiocephalic AVF  . BIOPSY  02/17/2018   Procedure: BIOPSY;  Surgeon: Milus Banister, MD;  Location: [facility] ENDOSCOPY;  Service: Endoscopy;;  . COLONOSCOPY    . COLONOSCOPY WITH PROPOFOL N/A 01/27/2018   Procedure: COLONOSCOPY WITH PROPOFOL;  Surgeon: [name], MD;  Location: [facility] ENDOSCOPY;  Service: Gastroenterology;  Laterality: N/A;  .  CORONARY ANGIOPLASTY    . ERCP  03/21/2018   AT [place]  . ESOPHAGOGASTRODUODENOSCOPY N/A 02/17/2018   Procedure: ESOPHAGOGASTRODUODENOSCOPY (EGD);  Surgeon: Milus Banister, MD;  Location: [facility] END[facility]COPY;  Service: Endoscopy;  Laterality: N/A;  . ESOPHAGOGASTRODUODENOSCOPY (EGD) WITH PROPOFOL N/A 01/27/2018   Procedure: ESOPHAGOGASTRODUODENOSCOPY (EGD) WITH PROPOFOL;  Surgeon: [name], MD;  Location: [facility] ENDOSCOPY;  Service: Gastroenterology;  Laterality: N/A;  . ESOPHAGOGASTRODUODENOSCOPY (EGD) WITH PROPOFOL N/A 04/03/2018   Procedure: ESOPHAGOGASTRODUODENOSCOPY (EGD) WITH PROPOFOL;  Surgeon: Clarene Essex, MD;  Location: [place];  Service: Endoscopy;  Laterality: N/A;  . EUS N/A  02/17/2018   Procedure: UPPER ENDOSCOPIC ULTRASOUND (EUS) RADIAL;  Surgeon: Milus Banister, MD;  Location: [facility] ENDOSCOPY;  Service: Endoscopy;  Laterality: N/A;  . fistulaogram     x 2 last 2 years  . IR DIALY SHUNT INTRO NEEDLE/INTRACATH INITIAL W/IMG LEFT Left 04/20/2018  . IR FLUORO GUIDE CV LINE RIGHT  09/16/2017  . IR US GUIDE VASC ACCESS RIGHT  09/16/2017  . IRRIGATION AND DEBRIDEMENT KNEE Left 09/15/2017   Procedure: IRRIGATION AND DEBRIDEMENT KNEE; arthroscopic clean out;  Surgeon: [name], MD;  Location: [facility] ORS;  Service: Orthopedics;  Laterality: Left;  . OTHER SURGICAL HISTORY     removal temporary HD catheter   . REVISON OF ARTERIOVENOUS FISTULA Left 10/10/2015   Procedure: REVISON OF LEFT RADIOCEPHALIC ARTERIOVENOUS FISTULA;  Surgeon: Angelia Mould, MD;  Location: [place] [facility];  Service: Vascular;  Laterality: Left;  . REVISON OF ARTERIOVENOUS FISTULA Left 02/07/2016   Procedure: REPAIR OF PSEUDO-ANEUREYSM OF LEFT ARM  ARTERIOVENOUS FISTULA;  Surgeon: Angelia Mould, MD;  Location: [facility];  Service: Vascular;  Laterality: Left;  . REVISON OF ARTERIOVENOUS FISTULA Left 5/49/8264   Procedure: PLICATION OF LEFT ARM RADIOCEPHALIC ARTERIOVENOUS FISTULA PSEUDOANEURYSM;  Surgeon: Angelia Mould, MD;  Location: [facility] [place];  Service: Vascular;  Laterality: Left;  . RIGHT/LEFT HEART CATH AND CORONARY ANGIOGRAPHY N/A 02/17/2017   Procedure: Right/Left Heart Cath and Coronary Angiography;  Surgeon: Sherren Mocha, MD;  Location: [place] CV LAB;  Service: Cardiovascular;  Laterality: N/A;  . TEE WITHOUT CARDIOVERSION N/A 09/11/2016   Procedure: TRANSESOPHAGEAL ECHOCARDIOGRAM (TEE);  Surgeon: Dorothy Spark, MD;  Location: [facility];  Service: Cardiovascular;  Laterality: N/A;  . TEE WITHOUT CARDIOVERSION N/A 05/05/2018   Procedure: TRANSESOPHAGEAL ECHOCARDIOGRAM (TEE);  Surgeon: Adrian Prows, MD;  Location: [facility];  Service: Cardiovascular;  Laterality: N/A;  Prefer  after 3:30 PM     Current Outpatient Medications  Medication Sig Dispense Refill  . albuterol (PROVENTIL) (2.5 MG/3ML) 0.083% nebulizer solution Take 2.5 mg by nebulization every 6 (six) hours as needed for wheezing or shortness of breath.    . ALPRAZolam (XANAX) 0.25 MG tablet Take by mouth at bedtime as needed for anxiety.    . bictegravir-emtricitabine-tenofovir AF (BIKTARVY) 50-200-25 MG TABS tablet Take 1 tablet by mouth daily. 30 tablet 11  . calcium acetate (PHOSLO) 667 MG capsule Take 667-2,001 mg by mouth See admin instructions. Take 1,334 mg three times a day with each meal and 667 mg with each snack    . carvedilol (COREG) 25 MG tablet Take 1 tablet (25 mg total) by mouth 2 (two) times daily with a meal. 60 tablet 5  . cloNIDine (CATAPRES - DOSED IN MG/24 HR) 0.3 mg/24hr patch Place 1 patch (0.3 mg total) onto the skin once a week. 4 patch 6  . cyclobenzaprine (FLEXERIL) 10 MG tablet Take 1 tablet (10 mg total) by  mouth at bedtime as needed for muscle spasms. 30 tablet 0  . epoetin alfa (EPOGEN,PROCRIT) 2000 UNIT/ML injection Inject 2,000 Units into the vein 3 (three) times a week.     . ethyl chloride spray Apply 1 application topically daily as needed (HD- to numb).   12  . heparin 1000 UNIT/ML injection Inject 3,000 Units into the vein 4 (four) times a week.     . hydrALAZINE (APRESOLINE) 100 MG tablet TAKE 1 TABLET(100 MG) BY MOUTH THREE TIMES DAILY 270 tablet 1  . hydrOXYzine (ATARAX/VISTARIL) 25 MG tablet Take 1 tablet (25 mg total) by mouth 4 (four) times daily as needed for itching. 90 tablet 2  . Ipratropium-Albuterol (COMBIVENT IN) Inhale 2 puffs into the lungs every 6 (six) hours as needed (as needed for shortness of breath).     . iron sucrose (VENOFER) 20 MG/ML injection Inject 100 mg into the vein every Monday.     . Lacosamide (VIMPAT) 100 MG TABS Take 1 tablet (100 mg total) by mouth 2 (two) times daily. 60 tablet 11  . lacosamide (VIMPAT) 50 MG TABS tablet Take one  half pill (25 mg) after hemodialysis 30 tablet 5  . multivitamin (RENA-VIT) TABS tablet Take 1 tablet by mouth daily.      Marland Kitchen omeprazole (PRILOSEC) 20 MG capsule Take 1 capsule (20 mg total) by mouth daily. 90 capsule 3  . traZODone (DESYREL) 50 MG tablet TAKE 1 TABLET(50 MG) BY MOUTH AT BEDTIME AS NEEDED FOR SLEEP 30 tablet 0  . VOLTAREN 1 % GEL APPLY 2 GRAMS EXTERNALLY TO THE AFFECTED AREA FOUR TIMES DAILY 100 g 1   No current facility-administered medications for this visit.     Physical Exam BP (!) 152/61 (BP Location: Right Arm, Patient Position: Sitting, Cuff Size: Large)   Pulse 62   Resp 16   Ht 6' (1.829 m)   Wt 172 lb (78 kg)   SpO2 95% Comment: ON RA  BMI 23.40 kg/m  62 year old man in no acute distress Alert and oriented x3 with no focal deficits No carotid bruits Cardiac regular rate and rhythm with a 2/6 systolic and diastolic murmur Lungs clear with equal breath sounds bilaterally Abdomen soft and nontender Extremities are without clubbing cyanosis or edema Positive thrill in left arm fistula, dressing in place.  Diagnostic Tests: Transesophageal echocardiogram Study Conclusions  - Left ventricle: The LV apex is trabeculated and there is a false tendon in the LV apex. Findings suggestive of non compaction. The cavity size was mildly dilated. There was severe concentric hypertrophy. Systolic function was normal. The estimated ejection fraction was in the range of 55% to 60%. Wall motion was normal; there were no regional wall motion abnormalities. Features are consistent with a pseudonormal left ventricular filling pattern, with concomitant abnormal relaxation and increased filling pressure (grade 2 diastolic dysfunction). Doppler parameters are consistent with high ventricular filling pressure. - Aortic valve: There was moderate to severe regurgitation. Valve area (VTI): 3.43 cm^2. Valve area (Vmean): 3.45 cm^2. - Mitral valve: There was  mild regurgitation. - Left atrium: The atrium was severely dilated. - Right ventricle: The cavity size was mildly dilated. Wall thickness was normal. - Pulmonary arteries: PA peak pressure: 62 mm Hg (S). - Pericardium, extracardiac: A trivial pericardial effusion was identified posterior to the heart. There was a left pleural effusion.  Impressions:  - Normal LVF with severe LVH and mild LVE. There is a left pleural effusion and trivial pericardial effusion posteriorly. There is mild  Jeffrey and trivial PR. The AV is not well visualized but there appears to be moderate to severe AI of unclear etiology. Suggest TEE to further evaluated AV. Consider Cardiac MRI to evaluate for non compaction LV.  TEE: Under moderate sedation3 mg versed and 25 mg Benadryl, TEE was performed without complications:  [id number]. Mild LV dilatation. Severe LVH. RV: Normal LA: Normal. Left atrial appendage: Normal without thrombus. Normal function. Inter atrial septum is intact without defect. Double contrast studynot done. RA: Normal, Rudimentary Eustachian valve noted. MV: NormalMildMR. TV: Normal Trace TR [id number]. The non-coronary cusp is perforated with resultant severe aortic regurgitation. No abscess noted. There is a small vegetation noted on the non and right coronary cusp. PV: Normal. Trace PI.  Thoracic and ascending aorta: Normal withvery mild mixed plaqueplaque in the descending thoracic aorta.  Conscious sedation protocol was followed, I personally administered conscious sedation and monitored the patient. Patient received32milligrams of Versed and 25 mg benadryl. Patient tolerated the procedure well and there was no complication from conscious sedation. Time administered was 21 minutes.        Electronically signed by Adrian Prows, MD at 05/05/2018 9:20 AM  I personally reviewed the transesophageal echocardiogram and concur with the findings noted  above  Impression: Jeffrey [name] is a 62 year old gentleman with multiple significant medical problems including end-stage renal disease on hemodialysis, recurrent aortic valve endocarditis with severe aortic insufficiency, atherosclerotic cardiovascular disease, HIV, hepatitis B, tubulovillous adenoma of the duodenum, hypertension, hypothyroidism, seizures, and thrombocytopenia.  He has symptomatic severe aortic insufficiency.  Aortic valve replacement is indicated.  I discussed aortic valve replacement with Jeffrey. Karel and his brother.  We reviewed the indications, risks, benefits, and alternatives.  They understand the general nature of the procedure including the need for general anesthesia, the incisions to be used, and the use of cardiopulmonary bypass.  We discussed the advantages and disadvantages of tissue versus mechanical valves.  I think a tissue valve is the best option for him although he may be at risk for early valve calcification.  They understand the risks of the procedure include, but not limited to death, MI, DVT, PE, stroke, bleeding, possible need for transfusion, infection, heart block requiring pacemaker, as well as respiratory or gastrointestinal complications.  He accepts his risks and wishes to proceed.  He had a catheterization in May 2018 which showed hemodynamically insignificant left main disease of 30 to 45%.  I do think he needs repeat catheterization prior to surgery.  Dr. Burt Knack did his previous catheterization.  I will check with him.  Dialysis access-he has had issues with some bleeding at access site.  He is scheduled to see vascular surgery tomorrow to address that.  Plan: Repeat cardiac catheterization Aortic valve replacement with tissue valve on Wednesday, July 20, 2018  [name], MD [facility] [phone]

## 2018-06-29 ENCOUNTER — Ambulatory Visit (HOSPITAL_COMMUNITY)
Admission: RE | Admit: 2018-06-29 | Discharge: 2018-06-29 | Disposition: A | Discharge status: Home / Self Care | Payer: Medicare Other | Source: Ambulatory Visit | Attending: Vascular Surgery | Admitting: Vascular Surgery

## 2018-06-29 ENCOUNTER — Ambulatory Visit (INDEPENDENT_AMBULATORY_CARE_PROVIDER_SITE_OTHER): Payer: Medicare Other | Admitting: Physician Assistant

## 2018-06-29 ENCOUNTER — Encounter: Payer: Self-pay | Admitting: *Deleted

## 2018-06-29 ENCOUNTER — Other Ambulatory Visit: Payer: Self-pay | Admitting: *Deleted

## 2018-06-29 VITALS — BP 183/80 | HR 64 | Temp 97.2°F | Resp 18 | Ht 72.0 in | Wt 172.0 lb

## 2018-06-29 DIAGNOSIS — Z992 Dependence on renal dialysis: Secondary | ICD-10-CM

## 2018-06-29 DIAGNOSIS — N186 End stage renal disease: Secondary | ICD-10-CM

## 2018-06-29 DIAGNOSIS — I2510 Atherosclerotic heart disease of native coronary artery without angina pectoris: Secondary | ICD-10-CM

## 2018-06-29 NOTE — Progress Notes (Signed)
[facility] [place] HISTORY AND PHYSICAL   History of Present Illness:  Jeffrey [name] is a 62 y.o. male who is status post left radiocephalic fistula creation by Dr. Bridgett Larsson in 2012.  He does home hemodialysis.  In January 2017 he presented with bleeding from his right radiocephalic fistula and underwent revision of the fistula by Dr. Scot Dock. Recurrent bleeding episodes from left radiocephalic AV fistula occurred he returned to the OR 1/61/0960 for Plication of left radiocephalic AV fistula.  He is here today with a chief complaint of bleeding on and off at his distal button hole site.  He states this has happened for the last month.  He has started a new canulation site since this has been bleeding.  He reports no fever, chills or purulence from the bleeding site.  Past medical history includes: Aortic valve regurgitation/leak,  HIV, hepatitis B, aortic valve endocarditis, tubulovillous adenoma of duodenum, hypertension, seizures, hypothyroidism, peripheral arterial disease, and thrombocytopenia.  He has a planned surgery Oct. 30 th with Dr. Roxan Hockey Aortic valve replacement with tissue valve.    Past Medical History:  Diagnosis Date  . Anemia   . Anxiety   . Arthritis   . Asthma    per pt hx  . End stage renal disease on home HD 07/10/2011   Started HD in September 2012 at [facility] with a tunneled HD catheter, now on home HD with NxtStage. Dialyzing through AVF L lower arm with buttonhole technique as of mid 2014. His brother does the HD treatments at home.  They are roommates for 23 years.  The brother works 3rd shift and gets off about 8am and then puts Jeffrey [name] on HD in the morning after getting home. Most of the time he does HD about 4 times a week, for about 4 hours per treatment. Cause of ESRD was HTN according to patient. He says he let his health go and ending up with complications, and that he didn't like seeing doctors in those days.  He says he was diagnosed with severe  HTN when he lived in [place] in his 75's.   . Hepatitis B carrier ([place])   . HIV infection ([place])   . Hypertension   . Hyperthyroidism    normal now  . Pneumonia several yrs ago  . Seizure ([place])   . Seizures ([place]) 06/02/2011   x 1 none since  . Thrombocytopenia ([place])     Past Surgical History:  Procedure Laterality Date  . AV FISTULA PLACEMENT  06/02/11   Left radiocephalic AVF  . BIOPSY  02/17/2018   Procedure: BIOPSY;  Surgeon: Milus Banister, MD;  Location: [facility] ENDOSCOPY;  Service: Endoscopy;;  . COLONOSCOPY    . COLONOSCOPY WITH PROPOFOL N/A 01/27/2018   Procedure: COLONOSCOPY WITH PROPOFOL;  Surgeon: [name], MD;  Location: [facility] ENDOSCOPY;  Service: Gastroenterology;  Laterality: N/A;  . CORONARY ANGIOPLASTY    . ERCP  03/21/2018   AT [place]  . ESOPHAGOGASTRODUODENOSCOPY N/A 02/17/2018   Procedure: ESOPHAGOGASTRODUODENOSCOPY (EGD);  Surgeon: Milus Banister, MD;  Location: [facility] ENDOSCOPY;  Service: Endoscopy;  Laterality: N/A;  . ESOPHAGOGASTRODUODENOSCOPY (EGD) WITH PROPOFOL N/A 01/27/2018   Procedure: ESOPHAGOGASTRODUODENOSCOPY (EGD) WITH PROPOFOL;  Surgeon: [name], MD;  Location: [facility] ENDOSCOPY;  Service: Gastroenterology;  Laterality: N/A;  . ESOPHAGOGASTRODUODENOSCOPY (EGD) WITH PROPOFOL N/A 04/03/2018   Procedure: ESOPHAGOGASTRODUODENOSCOPY (EGD) WITH PROPOFOL;  Surgeon: Clarene Essex, MD;  Location: [place];  Service: Endoscopy;  Laterality: N/A;  . EUS  N/A 02/17/2018   Procedure: UPPER ENDOSCOPIC ULTRASOUND (EUS) RADIAL;  Surgeon: Milus Banister, MD;  [facility] ENDOS[facility];  Service: Endoscopy;  Laterality: N/A;  . fistulaogram     x 2 last 2 years  . IR DIALY SHUNT INTRO NEEDLE/INTRACATH INITIAL W/IMG LEFT Left 04/20/2018  . IR FLUORO GUIDE CV LINE RIGHT  09/16/2017  . IR US GUIDE VASC ACCESS RIGHT  09/16/2017  . IRRIGATION AND DEBRIDEMENT KNEE Left 09/15/2017   Procedure: IRRIGATION AND DEBRIDEMENT KNEE; arthroscopic clean out;  Surgeon: [name], MD;  Location: [facility] ORS;  Service: Orthopedics;  Laterality: Left;  . OTHER SURGICAL HISTORY     removal temporary HD catheter   . REVISON OF ARTERIOVENOUS FISTULA Left 10/10/2015   Procedure: REVISON OF LEFT RADIOCEPHALIC ARTERIOVENOUS FISTULA;  Surgeon: Angelia Mould, MD;  Location: [place];  Service: Vascular;  Laterality: Left;  . REVISON OF ARTERIOVENOUS FISTULA Left 02/07/2016   Procedure: REPAIR OF PSEUDO-ANEUREYSM OF LEFT ARM  ARTERIOVENOUS FISTULA;  Surgeon: Angelia Mould, MD;  Location: [place];  Service: Vascular;  Laterality: Left;  . REVISON OF ARTERIOVENOUS FISTULA Left 1/44/8185   Procedure: PLICATION OF LEFT ARM RADIOCEPHALIC ARTERIOVENOUS FISTULA PSEUDOANEURYSM;  Surgeon: Angelia Mould, MD;  Location: [place];  Service: Vascular;  Laterality: Left;  . RIGHT/LEFT HEART CATH AND CORONARY ANGIOGRAPHY N/A 02/17/2017   Procedure: Right/Left Heart Cath and Coronary Angiography;  Surgeon: Sherren Mocha, MD;  Location: [place] CV LAB;  Service: Cardiovascular;  Laterality: N/A;  . TEE WITHOUT CARDIOVERSION N/A 09/11/2016   Procedure: TRANSESOPHAGEAL ECHOCARDIOGRAM (TEE);  Surgeon: Dorothy Spark, MD;  Location: [place];  Service: Cardiovascular;  Laterality: N/A;  . TEE WITHOUT CARDIOVERSION N/A 05/05/2018   Procedure: TRANSESOPHAGEAL ECHOCARDIOGRAM (TEE);  Surgeon: Adrian Prows, MD;  Location: [facility] ENDOSCOPY;  Service: Cardiovascular;  Laterality: N/A;  Prefer after 3:30 PM     Social History Social History   Tobacco Use  . Smoking status: Former Smoker    Years: 10.00    Types: Cigarettes    Last attempt to quit: 08/08/2015    Years since quitting: 2.8  . Smokeless tobacco: Never Used  . Tobacco comment: casual smoking for 10 years  Substance Use Topics  . Alcohol use: No    Alcohol/week: 0.0 standard drinks    Comment: quit 2004  . Drug use: No    Comment: uds (+) cocaine in 08/2011 but pt states was taking sudafed at the time    Family  History Family History  Problem Relation Age of Onset  . Hypertension Mother   . Diabetes Mother   . Cancer Father     Allergies  Allergies  Allergen Reactions  . Lisinopril Anaphylaxis and Shortness Of Breath    Throat swelling  . Penicillins Anaphylaxis and Other (See Comments)    Childhood allergy Has patient had a PCN reaction causing immediate rash, facial/tongue/throat swelling, SOB or lightheadedness with hypotension: Yes Has patient had a PCN reaction causing severe rash involving mucus membranes or skin necrosis: Unk Has patient had a PCN reaction that required hospitalization: Unk Has patient had a PCN reaction occurring within the last 10 years: No If all of the above answers are "NO", then may proceed with Cephalosporin use.   . Fentanyl Other (See Comments)    Lethargy, AMS  . Morphine And Related Other (See Comments)    "Not Himself"     Current Outpatient Medications  Medication Sig Dispense Refill  . albuterol (PROVENTIL) (2.5 MG/3ML)  0.083% nebulizer solution Take 2.5 mg by nebulization every 6 (six) hours as needed for wheezing or shortness of breath.    . ALPRAZolam (XANAX) 0.25 MG tablet Take by mouth at bedtime as needed for anxiety.    . bictegravir-emtricitabine-tenofovir AF (BIKTARVY) 50-200-25 MG TABS tablet Take 1 tablet by mouth daily. 30 tablet 11  . calcium acetate (PHOSLO) 667 MG capsule Take 667-2,001 mg by mouth See admin instructions. Take 1,334 mg three times a day with each meal and 667 mg with each snack    . carvedilol (COREG) 25 MG tablet Take 1 tablet (25 mg total) by mouth 2 (two) times daily with a meal. 60 tablet 5  . cloNIDine (CATAPRES - DOSED IN MG/24 HR) 0.3 mg/24hr patch Place 1 patch (0.3 mg total) onto the skin once a week. 4 patch 6  . cyclobenzaprine (FLEXERIL) 10 MG tablet Take 1 tablet (10 mg total) by mouth at bedtime as needed for muscle spasms. 30 tablet 0  . epoetin alfa (EPOGEN,PROCRIT) 2000 UNIT/ML injection Inject 2,000  Units into the vein 3 (three) times a week.     . ethyl chloride spray Apply 1 application topically daily as needed (HD- to numb).   12  . heparin 1000 UNIT/ML injection Inject 3,000 Units into the vein 4 (four) times a week.     . hydrALAZINE (APRESOLINE) 100 MG tablet TAKE 1 TABLET(100 MG) BY MOUTH THREE TIMES DAILY 270 tablet 1  . hydrOXYzine (ATARAX/VISTARIL) 25 MG tablet Take 1 tablet (25 mg total) by mouth 4 (four) times daily as needed for itching. 90 tablet 2  . Ipratropium-Albuterol (COMBIVENT IN) Inhale 2 puffs into the lungs every 6 (six) hours as needed (as needed for shortness of breath).     . iron sucrose (VENOFER) 20 MG/ML injection Inject 100 mg into the vein every Monday.     . Lacosamide (VIMPAT) 100 MG TABS Take 1 tablet (100 mg total) by mouth 2 (two) times daily. 60 tablet 11  . lacosamide (VIMPAT) 50 MG TABS tablet Take one half pill (25 mg) after hemodialysis 30 tablet 5  . multivitamin (RENA-VIT) TABS tablet Take 1 tablet by mouth daily.      Marland Kitchen omeprazole (PRILOSEC) 20 MG capsule Take 1 capsule (20 mg total) by mouth daily. 90 capsule 3  . traZODone (DESYREL) 50 MG tablet TAKE 1 TABLET(50 MG) BY MOUTH AT BEDTIME AS NEEDED FOR SLEEP 30 tablet 0  . VOLTAREN 1 % GEL APPLY 2 GRAMS EXTERNALLY TO THE AFFECTED AREA FOUR TIMES DAILY 100 g 1   No current facility-administered medications for this visit.     ROS:   General:  No weight loss, Fever, chills  HEENT: No recent headaches, no nasal bleeding, no visual changes, no sore throat  Neurologic: No dizziness, blackouts, seizures. No recent symptoms of stroke or mini- stroke. No recent episodes of slurred speech, or temporary blindness.  Cardiac: No recent episodes of chest pain/pressure, no shortness of breath at rest.  No shortness of breath with exertion.  Denies history of atrial fibrillation or irregular heartbeat  Vascular: No history of rest pain in feet.  No history of claudication.  No history of non-healing  ulcer, No history of DVT   Pulmonary: No home oxygen, no productive cough, no hemoptysis,  No asthma or wheezing  Musculoskeletal:  [ ]  Arthritis, [ ]  Low back pain,  [ ]  Joint pain  Hematologic:No history of hypercoagulable state.  No history of easy bleeding.  No history  of anemia  Gastrointestinal: No hematochezia or melena,  No gastroesophageal reflux, no trouble swallowing  Urinary: [ ]  chronic Kidney disease, [x ] on HD - [ ]  MWF or [ ]  TTHS, [ ]  Burning with urination, [ ]  Frequent urination, [ ]  Difficulty urinating;   Skin: No rashes  Psychological: No history of anxiety,  No history of depression   Physical Examination  Vitals:   06/29/18 1403  BP: (!) 183/80  Pulse: 64  Resp: 18  Temp: (!) 97.2 F (36.2 C)  TempSrc: Oral  SpO2: 98%  Weight: 172 lb (78 kg)  Height: 6' (1.829 m)    Body mass index is 23.33 kg/m.  General:  Alert and oriented, no acute distress HEENT: Normal, normocephalic Neck: No bruit or JVD Pulmonary: Clear to auscultation bilaterally with rhonchi B  Cardiac: Regular Rate and Rhythm with murmur/regurgitation Gastrointestinal: Soft, non-tender, non-distended, no mass, no scars Skin: No rash Extremity Pulses:  2+ radial, brachial pulses bilaterally, palpable left forearm fistula.  Well defined Button holes from repeat HD stick sites.  No active bleeding on exam today.  No erythema or purulent drainage. Musculoskeletal: No deformity or edema  Neurologic: Upper and lower extremity motor intact and equal   DATA:  Fistula duplex  Shows well developed patent fistula   ASSESSMENT/PLAN:  He has recurrent aortic valve endocarditis with severe aortic insufficiency, atherosclerotic cardiovascular disease with planned Aortic valve replacement with tissue valve Oct 30 th by Dr. Roxan Hockey.  Prolonged bleeding episode distal button hole site  We will schedule him for revision of the distal button hole stick site on Monday Oct 14 under local  sedation.  He will continue to use alternate stick sites for home HD until then.      Roxy Horseman PA-C [facility] [place] Office: [phone]  MD in office Dr. Scot Dock

## 2018-06-29 NOTE — H&P (View-Only) (Signed)
[facility] [place] HISTORY AND PHYSICAL   History of Present Illness:  Jeffrey [name] is a 62 y.o. male who is status post left radiocephalic fistula creation by Dr. Bridgett [name] in 2012.  He does home hemodialysis.  In January 2017 he presented with bleeding from his right radiocephalic fistula and underwent revision of the fistula by Dr. Scot [name]. Recurrent bleeding episodes from left radiocephalic AV fistula occurred he returned to the OR 06/04/7828 for Plication of left radiocephalic AV fistula.  He is here today with a chief complaint of bleeding on and off at his distal button hole site.  He states this has happened for the last month.  He has started a new canulation site since this has been bleeding.  He reports no fever, chills or purulence from the bleeding site.  Past medical history includes: Aortic valve regurgitation/leak,  HIV, hepatitis B, aortic valve endocarditis, tubulovillous adenoma of duodenum, hypertension, seizures, hypothyroidism, peripheral arterial disease, and thrombocytopenia.  He has a planned surgery Oct. 30 th with Dr. Roxan [name] Aortic valve replacement with tissue valve.    Past Medical History:  Diagnosis Date  . Anemia   . Anxiety   . Arthritis   . Asthma    per pt hx  . End stage renal disease on home HD 07/10/2011   Started HD in September 2012 at [facility] with a tunneled HD catheter, now on home HD with NxtStage. Dialyzing through AVF L lower arm with buttonhole technique as of mid 2014. His brother does the HD treatments at home.  They are roommates for 23 years.  The brother works 3rd shift and gets off about 8am and then puts Mr Jeffrey [name] on HD in the morning after getting home. Most of the time he does HD about 4 times a week, for about 4 hours per treatment. Cause of ESRD was HTN according to patient. He says he let his health go and ending up with complications, and that he didn't like seeing doctors in those days.  He says he was diagnosed with severe  HTN when he lived in [place] in his 48's.   . Hepatitis B carrier ([name])   . HIV infection ([name])   . Hypertension   . Hyperthyroidism    normal now  . Pneumonia several yrs ago  . Seizure ([name])   . Seizures ([name]) 06/02/2011   x 1 none since  . Thrombocytopenia ([name])     Past Surgical History:  Procedure Laterality Date  . AV FISTULA PLACEMENT  06/02/11   Left radiocephalic AVF  . BIOPSY  02/17/2018   Procedure: BIOPSY;  Surgeon: Milus Banister, MD;  [facility];  Service: Endoscopy;;  . COLONOSCOPY    . COLONOSCOPY WITH PROPOFOL N/A 01/27/2018   Procedure: COLONOSCOPY WITH PROPOFOL;  Surgeon: [name], MD;  Location: [facility] ENDOSCOPY;  Service: Gastroenterology;  Laterality: N/A;  . CORONARY ANGIOPLASTY    . ERCP  03/21/2018   AT [place]  . ESOPHAGOGASTRODUODENOSCOPY N/A 02/17/2018   Procedure: ESOPHAGOGASTRODUODENOSCOPY (EGD);  Surgeon: Milus Banister, MD;  Location: [facility];  Service: Endoscopy;  Laterality: N/A;  . ESOPHAGOGASTRODUODENOSCOPY (EGD) WITH PROPOFOL N/A 01/27/2018   Procedure: ESOPHAGOGASTRODUODENOSCOPY (EGD) WITH PROPOFOL;  Surgeon: [name], MD;  Location: [facility] ENDOSCOPY;  Service: Gastroenterology;  Laterality: N/A;  . ESOPHAGOGASTRODUODENOSCOPY (EGD) WITH PROPOFOL N/A 04/03/2018   Procedure: ESOPHAGOGASTRODUODENOSCOPY (EGD) WITH PROPOFOL;  Surgeon: Clarene Essex, MD;  Location: [place];  Service: Endoscopy;  Laterality: N/A;  . EUS  N/A 02/17/2018   Procedure: UPPER ENDOSCOPIC ULTRASOUND (EUS) RADIAL;  Surgeon: Milus Banister, MD;  [facility];  Service: Endoscopy;  Laterality: N/A;  . fistulaogram     x 2 last 2 years  . IR DIALY SHUNT INTRO NEEDLE/INTRACATH INITIAL W/IMG LEFT Left 04/20/2018  . IR FLUORO GUIDE CV LINE RIGHT  09/16/2017  . IR US GUIDE VASC ACCESS RIGHT  09/16/2017  . IRRIGATION AND DEBRIDEMENT KNEE Left 09/15/2017   Procedure: IRRIGATION AND DEBRIDEMENT KNEE; arthroscopic clean out;  Surgeon: [name], MD;  [facility];  Service: Orthopedics;  Laterality: Left;  . OTHER SURGICAL HISTORY     removal temporary HD catheter   . REVISON OF ARTERIOVENOUS FISTULA Left 10/10/2015   Procedure: REVISON OF LEFT RADIOCEPHALIC ARTERIOVENOUS FISTULA;  Surgeon: Angelia Mould, MD;  Location: [place];  Service: Vascular;  Laterality: Left;  . REVISON OF ARTERIOVENOUS FISTULA Left 02/07/2016   Procedure: REPAIR OF PSEUDO-ANEUREYSM OF LEFT ARM  ARTERIOVENOUS FISTULA;  Surgeon: Angelia Mould, MD;  Location: [place];  Service: Vascular;  Laterality: Left;  . REVISON OF ARTERIOVENOUS FISTULA Left 09/21/7508   Procedure: PLICATION OF LEFT ARM RADIOCEPHALIC ARTERIOVENOUS FISTULA PSEUDOANEURYSM;  Surgeon: Angelia Mould, MD;  Location: [place];  Service: Vascular;  Laterality: Left;  . RIGHT/LEFT HEART CATH AND CORONARY ANGIOGRAPHY N/A 02/17/2017   Procedure: Right/Left Heart Cath and Coronary Angiography;  Surgeon: Sherren Mocha, MD;  Location: [place] CV LAB;  Service: Cardiovascular;  Laterality: N/A;  . TEE WITHOUT CARDIOVERSION N/A 09/11/2016   Procedure: TRANSESOPHAGEAL ECHOCARDIOGRAM (TEE);  Surgeon: Dorothy Spark, MD;  Location: [place];  Service: Cardiovascular;  Laterality: N/A;  . TEE WITHOUT CARDIOVERSION N/A 05/05/2018   Procedure: TRANSESOPHAGEAL ECHOCARDIOGRAM (TEE);  Surgeon: Adrian Prows, MD;  Location: [facility] ENDOSCOPY;  Service: Cardiovascular;  Laterality: N/A;  Prefer after 3:30 PM     Social History Social History   Tobacco Use  . Smoking status: Former Smoker    Years: 10.00    Types: Cigarettes    Last attempt to quit: 08/08/2015    Years since quitting: 2.8  . Smokeless tobacco: Never Used  . Tobacco comment: casual smoking for 10 years  Substance Use Topics  . Alcohol use: No    Alcohol/week: 0.0 standard drinks    Comment: quit 2004  . Drug use: No    Comment: uds (+) cocaine in 08/2011 but pt states was taking sudafed at the time    Family  History Family History  Problem Relation Age of Onset  . Hypertension Mother   . Diabetes Mother   . Cancer Father     Allergies  Allergies  Allergen Reactions  . Lisinopril Anaphylaxis and Shortness Of Breath    Throat swelling  . Penicillins Anaphylaxis and Other (See Comments)    Childhood allergy Has patient had a PCN reaction causing immediate rash, facial/tongue/throat swelling, SOB or lightheadedness with hypotension: Yes Has patient had a PCN reaction causing severe rash involving mucus membranes or skin necrosis: Unk Has patient had a PCN reaction that required hospitalization: Unk Has patient had a PCN reaction occurring within the last 10 years: No If all of the above answers are "NO", then may proceed with Cephalosporin use.   . Fentanyl Other (See Comments)    Lethargy, AMS  . Morphine And Related Other (See Comments)    "Not Himself"     Current Outpatient Medications  Medication Sig Dispense Refill  . albuterol (PROVENTIL) (2.5 MG/3ML)  0.083% nebulizer solution Take 2.5 mg by nebulization every 6 (six) hours as needed for wheezing or shortness of breath.    . ALPRAZolam (XANAX) 0.25 MG tablet Take by mouth at bedtime as needed for anxiety.    . bictegravir-emtricitabine-tenofovir AF (BIKTARVY) 50-200-25 MG TABS tablet Take 1 tablet by mouth daily. 30 tablet 11  . calcium acetate (PHOSLO) 667 MG capsule Take 667-2,001 mg by mouth See admin instructions. Take 1,334 mg three times a day with each meal and 667 mg with each snack    . carvedilol (COREG) 25 MG tablet Take 1 tablet (25 mg total) by mouth 2 (two) times daily with a meal. 60 tablet 5  . cloNIDine (CATAPRES - DOSED IN MG/24 HR) 0.3 mg/24hr patch Place 1 patch (0.3 mg total) onto the skin once a week. 4 patch 6  . cyclobenzaprine (FLEXERIL) 10 MG tablet Take 1 tablet (10 mg total) by mouth at bedtime as needed for muscle spasms. 30 tablet 0  . epoetin alfa (EPOGEN,PROCRIT) 2000 UNIT/ML injection Inject 2,000  Units into the vein 3 (three) times a week.     . ethyl chloride spray Apply 1 application topically daily as needed (HD- to numb).   12  . heparin 1000 UNIT/ML injection Inject 3,000 Units into the vein 4 (four) times a week.     . hydrALAZINE (APRESOLINE) 100 MG tablet TAKE 1 TABLET(100 MG) BY MOUTH THREE TIMES DAILY 270 tablet 1  . hydrOXYzine (ATARAX/VISTARIL) 25 MG tablet Take 1 tablet (25 mg total) by mouth 4 (four) times daily as needed for itching. 90 tablet 2  . Ipratropium-Albuterol (COMBIVENT IN) Inhale 2 puffs into the lungs every 6 (six) hours as needed (as needed for shortness of breath).     . iron sucrose (VENOFER) 20 MG/ML injection Inject 100 mg into the vein every Monday.     . Lacosamide (VIMPAT) 100 MG TABS Take 1 tablet (100 mg total) by mouth 2 (two) times daily. 60 tablet 11  . lacosamide (VIMPAT) 50 MG TABS tablet Take one half pill (25 mg) after hemodialysis 30 tablet 5  . multivitamin (RENA-VIT) TABS tablet Take 1 tablet by mouth daily.      Marland Kitchen omeprazole (PRILOSEC) 20 MG capsule Take 1 capsule (20 mg total) by mouth daily. 90 capsule 3  . traZODone (DESYREL) 50 MG tablet TAKE 1 TABLET(50 MG) BY MOUTH AT BEDTIME AS NEEDED FOR SLEEP 30 tablet 0  . VOLTAREN 1 % GEL APPLY 2 GRAMS EXTERNALLY TO THE AFFECTED AREA FOUR TIMES DAILY 100 g 1   No current facility-administered medications for this visit.     ROS:   General:  No weight loss, Fever, chills  HEENT: No recent headaches, no nasal bleeding, no visual changes, no sore throat  Neurologic: No dizziness, blackouts, seizures. No recent symptoms of stroke or mini- stroke. No recent episodes of slurred speech, or temporary blindness.  Cardiac: No recent episodes of chest pain/pressure, no shortness of breath at rest.  No shortness of breath with exertion.  Denies history of atrial fibrillation or irregular heartbeat  Vascular: No history of rest pain in feet.  No history of claudication.  No history of non-healing  ulcer, No history of DVT   Pulmonary: No home oxygen, no productive cough, no hemoptysis,  No asthma or wheezing  Musculoskeletal:  [ ]  Arthritis, [ ]  Low back pain,  [ ]  Joint pain  Hematologic:No history of hypercoagulable state.  No history of easy bleeding.  No history  of anemia  Gastrointestinal: No hematochezia or melena,  No gastroesophageal reflux, no trouble swallowing  Urinary: [ ]  chronic Kidney disease, [x ] on HD - [ ]  MWF or [ ]  TTHS, [ ]  Burning with urination, [ ]  Frequent urination, [ ]  Difficulty urinating;   Skin: No rashes  Psychological: No history of anxiety,  No history of depression   Physical Examination  Vitals:   06/29/18 1403  BP: (!) 183/80  Pulse: 64  Resp: 18  Temp: (!) 97.2 F (36.2 C)  TempSrc: Oral  SpO2: 98%  Weight: 172 lb (78 kg)  Height: 6' (1.829 m)    Body mass index is 23.33 kg/m.  General:  Alert and oriented, no acute distress HEENT: Normal, normocephalic Neck: No bruit or JVD Pulmonary: Clear to auscultation bilaterally with rhonchi B  Cardiac: Regular Rate and Rhythm with murmur/regurgitation Gastrointestinal: Soft, non-tender, non-distended, no mass, no scars Skin: No rash Extremity Pulses:  2+ radial, brachial pulses bilaterally, palpable left forearm fistula.  Well defined Button holes from repeat HD stick sites.  No active bleeding on exam today.  No erythema or purulent drainage. Musculoskeletal: No deformity or edema  Neurologic: Upper and lower extremity motor intact and equal   DATA:  Fistula duplex  Shows well developed patent fistula   ASSESSMENT/PLAN:  He has recurrent aortic valve endocarditis with severe aortic insufficiency, atherosclerotic cardiovascular disease with planned Aortic valve replacement with tissue valve Oct 30 th by Dr. Roxan [name].  Prolonged bleeding episode distal button hole site  We will schedule him for revision of the distal button hole stick site on Monday Oct 14 under local  sedation.  He will continue to use alternate stick sites for home HD until then.      Roxy Horseman PA-C [facility] [place] Office: [phone]  MD in office Dr. Scot [name]

## 2018-06-30 ENCOUNTER — Telehealth: Payer: Self-pay

## 2018-06-30 DIAGNOSIS — D631 Anemia in chronic kidney disease: Secondary | ICD-10-CM | POA: Diagnosis not present

## 2018-06-30 DIAGNOSIS — N186 End stage renal disease: Secondary | ICD-10-CM | POA: Diagnosis not present

## 2018-06-30 DIAGNOSIS — Z992 Dependence on renal dialysis: Secondary | ICD-10-CM | POA: Diagnosis not present

## 2018-06-30 DIAGNOSIS — I2510 Atherosclerotic heart disease of native coronary artery without angina pectoris: Secondary | ICD-10-CM | POA: Diagnosis not present

## 2018-06-30 DIAGNOSIS — E46 Unspecified protein-calorie malnutrition: Secondary | ICD-10-CM | POA: Diagnosis not present

## 2018-06-30 DIAGNOSIS — N2581 Secondary hyperparathyroidism of renal origin: Secondary | ICD-10-CM | POA: Diagnosis not present

## 2018-06-30 DIAGNOSIS — I120 Hypertensive chronic kidney disease with stage 5 chronic kidney disease or end stage renal disease: Secondary | ICD-10-CM | POA: Diagnosis not present

## 2018-06-30 NOTE — Telephone Encounter (Signed)
Spoke with DPR [name]). Scheduled patient tomorrow with Maryla Morrow to arrange cath and possible left main IVUS prior to AVR planned 10/30. He was grateful for call and agrees with treatment plan.

## 2018-06-30 NOTE — Telephone Encounter (Signed)
-----   Message from Sherren Mocha, MD sent at 06/28/2018  5:51 PM EDT ----- Valetta Fuller - can you set Mr Grattan up for an office visit with me or my APP to arrange cath and possible left main IVUS? Needs to be done in the next few weeks before AVR - if I don't have any spots in the cath lab ok to schedule with [facility] or one of the other IC's.  thx ----- Message ----- From: [name], MD Sent: 06/28/2018  12:23 PM EDT To: Evans Lance, MD, Sherren Mocha, MD  Orma Flaming  Mr. Malacara is going to need an AVR for recurrent AV endocarditis. He has completed his antibiotics and now has severe AI. He is not sure who his cardiologist is and has not been terribly reliable with follow up, but I know both of you have seen him at different times in the past. He had a cath in May 2018 which showed 35-40% left main stenosis. I think we should probably recath him before his AVR which we scheduled for 07/20/2018. Let me know what you think.  Thanks  [name]

## 2018-07-01 ENCOUNTER — Encounter: Payer: Self-pay | Admitting: Cardiology

## 2018-07-01 ENCOUNTER — Other Ambulatory Visit: Payer: Self-pay

## 2018-07-01 ENCOUNTER — Encounter (HOSPITAL_COMMUNITY): Payer: Self-pay | Admitting: *Deleted

## 2018-07-01 ENCOUNTER — Ambulatory Visit (INDEPENDENT_AMBULATORY_CARE_PROVIDER_SITE_OTHER): Payer: Medicare Other | Admitting: Cardiology

## 2018-07-01 VITALS — BP 180/70 | HR 65 | Ht 72.0 in | Wt 164.0 lb

## 2018-07-01 DIAGNOSIS — I10 Essential (primary) hypertension: Secondary | ICD-10-CM

## 2018-07-01 DIAGNOSIS — D638 Anemia in other chronic diseases classified elsewhere: Secondary | ICD-10-CM | POA: Diagnosis not present

## 2018-07-01 DIAGNOSIS — I2510 Atherosclerotic heart disease of native coronary artery without angina pectoris: Secondary | ICD-10-CM | POA: Diagnosis not present

## 2018-07-01 DIAGNOSIS — N186 End stage renal disease: Secondary | ICD-10-CM | POA: Diagnosis not present

## 2018-07-01 DIAGNOSIS — G4733 Obstructive sleep apnea (adult) (pediatric): Secondary | ICD-10-CM | POA: Diagnosis not present

## 2018-07-01 DIAGNOSIS — I351 Nonrheumatic aortic (valve) insufficiency: Secondary | ICD-10-CM

## 2018-07-01 NOTE — Progress Notes (Signed)
Spoke with pt's brother, [name] for pre-op call. DPR on file. Pt has hx of Aortic Valve stenosis, scheduled for replacement end of this month. Brother states he has no other heart problems. States pt is not diabetic.  Pt does home dialysis 4 days a week.

## 2018-07-01 NOTE — Progress Notes (Signed)
Spoke with Dr. Fransisco Beau, Anesthesiologist about pt's heart hx. Pt is to have his fistula revised under local anesthesia. Dr. Fransisco Beau states pt will be evaluated day of surgery.

## 2018-07-01 NOTE — Patient Outreach (Signed)
[facility]) Care Management  07/01/2018  DIXON LUCZAK 1956-06-25 [id number]  62 year old with recent admission 8/11-8/16 with ESRD/HD, Bacteremia, MRSE aortic valve.4 admissions in the past 6 months and 7 ED visits in the past 6 months.  8/11-8/16 ESRD, Bacteremia, MRSE aortic valve endocarditis 05/27/18 ED presentation SOB 06/02/18 ED presentation chest pain  06/03/18 ED presentation Fall, back pain-did not stay 06/04/18 ED presentation Fall, lumbar strain  Multiple attempts made to engage client/caregiver. No response from telephonic outreaches, no response from outreach letter.  Plan: close case.  [name], RN, MSN, [place] Coordinator Cell: [phone]

## 2018-07-01 NOTE — Patient Instructions (Signed)
Provided patient with instructions as described in note. Please follow up with PCP and neurology.

## 2018-07-01 NOTE — Patient Instructions (Addendum)
Medication Instructions:  Your physician recommends that you continue on your current medications as directed. Please refer to the Current Medication list given to you today.  If you need a refill on your cardiac medications before your next appointment, please call your pharmacy.   Lab work: FUTURE: BMET & CBC on 07/06/18 our lab is open between 7:30 AM-5 PM  Testing/Procedures: You are scheduled to have a left and right heart cath on 07/11/18  Follow-Up: At [facility], you and your health needs are our priority.  As part of our continuing mission to provide you with exceptional heart care, we have created designated Provider Care Teams.  These Care Teams include your primary Cardiologist (physician) and Advanced Practice Providers (APPs -  Physician Assistants and Nurse Practitioners) who all work together to provide you with the care you need, when you need it. You will need a follow up appointment in:  3rd week in November.  You may see Sherren Mocha, MD or one of the following Advanced Practice Providers on your designated Care Team: Richardson Dopp, PA-C [place], [place] . Daune Perch, NP  Any Other Special Instructions Will Be Listed Below (If Applicable).    [place] OFFICE [address], [place] [address] Dept: [phone] Loc: [place]  07/01/2018  You are scheduled for a Cardiac Catheterization on Monday, October 21 with Dr. Lauree Chandler.  1. Please arrive at the [facility] (Main Entrance A) at [facility]: [address] [place], [place] [address] at 8:30 AM (This time is two hours before your procedure to ensure your preparation). Free valet parking service is available.   Special note: Every effort is made to have your procedure done on time. Please understand that emergencies sometimes delay scheduled procedures.  2. Diet: Do not  eat solid foods after midnight.  The patient may have clear liquids until 5am upon the day of the procedure.  3. Labs: You will need to have blood drawn on Wednesday, October 16 at [facility] at [facility]. [address]  Open: 7:30am - 5pm    Phone: [phone]. You do not need to be fasting.   Contrast Allergy: No  *For reference purposes while preparing patient instructions.   Delete this med list prior to printing instructions for patient.*  On the morning of your procedure, take your Aspirin and any morning medicines NOT listed above.  You may use sips of water.  5. Plan for one night stay--bring personal belongings. 6. Bring a current list of your medications and current insurance cards. 7. You MUST have a responsible person to drive you home. 8. Someone MUST be with you the first 24 hours after you arrive home or your discharge will be delayed. 9. Please wear clothes that are easy to get on and off and wear slip-on shoes.  Thank you for allowing [name] to care for you!   -- [facility]

## 2018-07-01 NOTE — Progress Notes (Signed)
Cardiology Office Note:    Date:  07/01/2018   ID:  [name], DOB 03-06-1956, MRN [id number]  PCP:  Velna Ochs, MD  Cardiologist:  Sherren Mocha, MD  Referring MD: Velna Ochs, MD   Chief Complaint  Patient presents with  . Aortic Insuffiency    History of Present Illness:    Jeffrey [name] is a 62 y.o. male with a past medical history significant for recurrent aortic valve endocarditis with severe aortic insufficiency, end-stage renal disease on hemodialysis (4 times a week; at home), HIV, hypertension, anemia of chronic disease, hypothyroidism, peripheral arterial disease, coronary artery disease, hepatitis B and tubulovillous adenoma of the duodenum.  Cardiac catheterization in May 2018 demonstrated heavily calcified coronary arteries with mild diffuse nonobstructive CAD and severe aortic insufficiency.  Follow-up echocardiograms have demonstrated improved aortic insufficiency.  He was previously evaluated by Dr. Roxan Hockey with [facility].  Given the improvement in his aortic insufficiency, valve replacement was not recommended.    He continued to do well until the summer when he developed shortness of breath and was admitted to the hospital in August.  He had methicillin-resistant staph epidermidis bacteremia.  Transesophageal echocardiogram showed a small vegetation and severe aortic insufficiency.  He was treated with vancomycin.  He was evaluated by CT surgery in September and recommended completion of antibiotics prior to undergoing valve surgery.  He was seen back in the cardiothoracic surgery office on 06/28/2018 by Dr. Roxan Hockey.  He has now completed his antibiotics and is afebrile.  He was noted to have dyspnea on mild exertion. He is planned for aortic valve replacement on 07/20/2018 with Dr. Roxan Hockey and he is here today for arrangement of cardiac catheterization in preparation for surgery.  He is also scheduled for revision of his AV fistula on  07/04/2018 Scheduled for cataract surgery on 10/17. Follow up for that on 10/18.   Today he is here with his brother who is his caregiver. He is dyspneic with activity. Some days he can do some activity. Yesterday he could walk up his stairs and down the hall with his physical therapist, but today he is not able to walk across the room. He is shaky and unsteady. He is not short of breath today. At night he sometimes wakes short of breath. He denies chest pain/tightness/pressure. No edema.   Recent sleep study positive for OSA and he is in process of getting CPAP.   Dr. Holley Raring is his nephrologist.    Past Medical History:  Diagnosis Date  . Anemia   . Anxiety   . Arthritis   . Asthma    per pt hx  . End stage renal disease on home HD 07/10/2011   Started HD in September 2012 at [facility] with a tunneled HD catheter, now on home HD with [facility]. Dialyzing through AVF L lower arm with buttonhole technique as of mid 2014. His brother does the HD treatments at home.  They are roommates for 23 years.  The brother works 3rd shift and gets off about 8am and then puts [name] on HD in the morning after getting home. Most of the time he does HD about 4 times a week, for about 4 hours per treatment. Cause of ESRD was HTN according to patient. He says he let his health go and ending up with complications, and that he didn't like seeing doctors in those days.  He says he was diagnosed with severe HTN when he lived in [place] in his 31's.   Marland Kitchen  Hepatitis B carrier (HCC)   . HIV infection (HCC)   . Hypertension   . Hyperthyroidism    normal now  . Pneumonia several yrs ago  . Seizure (HCC)   . Seizures (HCC) 06/02/2011   x 1 none since  . Thrombocytopenia (HCC)     Past Surgical History:  Procedure Laterality Date  . AV FISTULA PLACEMENT  06/02/11   Left radiocephalic AVF  . BIOPSY  02/17/2018   Procedure: BIOPSY;  Surgeon: Jacobs, Daniel P, MD;  Location: [facility];  Service: Endoscopy;;  .  COLONOSCOPY    . COLONOSCOPY WITH PROPOFOL N/A 01/27/2018   Procedure: COLONOSCOPY WITH PROPOFOL;  Surgeon: Anna, Kiran, MD;  Location: [facility] ENDOSCOPY;  Service: Gastroenterology;  Laterality: N/A;  . CORONARY ANGIOPLASTY    . ERCP  03/21/2018   AT [place]  . ESOPHAGOGASTRODUODENOSCOPY N/A 02/17/2018   Procedure: ESOPHAGOGASTRODUODENOSCOPY (EGD);  Surgeon: Jacobs, Daniel P, MD;  Location: [facility];  Service: Endoscopy;  Laterality: N/A;  . ESOPHAGOGASTRODUODENOSCOPY (EGD) WITH PROPOFOL N/A 01/27/2018   Procedure: ESOPHAGOGASTRODUODENOSCOPY (EGD) WITH PROPOFOL;  Surgeon: Anna, Kiran, MD;  Location: [facility] ENDOSCOPY;  Service: Gastroenterology;  Laterality: N/A;  . ESOPHAGOGASTRODUODENOSCOPY (EGD) WITH PROPOFOL N/A 04/03/2018   Procedure: ESOPHAGOGASTRODUODENOSCOPY (EGD) WITH PROPOFOL;  Surgeon: Magod, Marc, MD;  Location: [facility];  Service: Endoscopy;  Laterality: N/A;  . EUS N/A 02/17/2018   Procedure: UPPER ENDOSCOPIC ULTRASOUND (EUS) RADIAL;  Surgeon: Jacobs, Daniel P, MD;  Location: [facility];  Service: Endoscopy;  Laterality: N/A;  . fistulaogram     x 2 last 2 years  . IR DIALY SHUNT INTRO NEEDLE/INTRACATH INITIAL W/IMG LEFT Left 04/20/2018  . IR FLUORO GUIDE CV LINE RIGHT  09/16/2017  . IR US GUIDE VASC ACCESS RIGHT  09/16/2017  . IRRIGATION AND DEBRIDEMENT KNEE Left 09/15/2017   Procedure: IRRIGATION AND DEBRIDEMENT KNEE; arthroscopic clean out;  Surgeon: Gioffre, Ronald, MD;  Location: [facility]S;  Service: Orthopedics;  Laterality: Left;  . OTHER SURGICAL HISTORY     removal temporary HD catheter   . REVISON OF ARTERIOVENOUS FISTULA Left 10/10/2015   Procedure: REVISON OF LEFT RADIOCEPHALIC ARTERIOVENOUS FISTULA;  Surgeon: Christopher S Dickson, MD;  Location: [facility] OR;  Service: Vascular;  Laterality: Left;  . REVISON OF ARTERIOVENOUS FISTULA Left 02/07/2016   Procedure: REPAIR OF PSEUDO-ANEUREYSM OF LEFT ARM  ARTERIOVENOUS FISTULA;  Surgeon: Christopher S Dickson, MD;  Location: [facility]  OR;  Service: Vascular;  Laterality: Left;  . REVISON OF ARTERIOVENOUS FISTULA Left 04/10/2016   Procedure: PLICATION OF LEFT ARM RADIOCEPHALIC ARTERIOVENOUS FISTULA PSEUDOANEURYSM;  Surgeon: Christopher S Dickson, MD;  Location: [facility] OR;  Service: Vascular;  Laterality: Left;  . RIGHT/LEFT HEART CATH AND CORONARY ANGIOGRAPHY N/A 02/17/2017   Procedure: Right/Left Heart Cath and Coronary Angiography;  Surgeon: Cooper, Michael, MD;  [facility] INVAS[facility] CV LAB;  Service: Cardiovascular;  Laterality: N/A;  . TEE WITHOUT CARDIOVERSION N/A 09/11/2016   Procedure: TRANSESOPHAGEAL ECHOCARDIOGRAM (TEE);  Surgeon: Katarina H Nelson, MD;  [facility];  Service: Cardiovascular;  Laterality: N/A;  . TEE WITHOUT CARDIOVERSION N/A 05/05/2018   Procedure: TRANSESOPHAGEAL ECHOCARDIOGRAM (TEE);  Surgeon: Ganji, Jay, MD;  [facility]: [facility];  Service: Cardiovascular;  Laterality: N/A;  Prefer after 3:30 PM    Current Medications: Current Meds  Medication Sig  . albuterol (PROVENTIL) (2.5 MG/3ML) 0.083% nebulizer solution Take 2.5 mg by nebulization every 6 (six) hours as needed for wheezing or shortness of breath.  . ALPRAZolam (XANAX) 0.25 MG tablet Take by mouth at   bedtime as needed for anxiety.  . bictegravir-emtricitabine-tenofovir AF (BIKTARVY) 50-200-25 MG TABS tablet Take 1 tablet by mouth daily.  . calcium acetate (PHOSLO) 667 MG capsule Take 667-2,001 mg by mouth See admin instructions. Take 1,334 mg three times a day with each meal and 667 mg with each snack  . carvedilol (COREG) 25 MG tablet Take 1 tablet (25 mg total) by mouth 2 (two) times daily with a meal.  . cloNIDine (CATAPRES - DOSED IN MG/24 HR) 0.3 mg/24hr patch Place 1 patch (0.3 mg total) onto the skin once a week.  . cyclobenzaprine (FLEXERIL) 10 MG tablet Take 1 tablet (10 mg total) by mouth at bedtime as needed for muscle spasms.  Marland Kitchen epoetin alfa (EPOGEN,PROCRIT) 2000 UNIT/ML injection Inject 2,000 Units into the vein 3 (three)  times a week.   . ethyl chloride spray Apply 1 application topically daily as needed (HD- to numb).   . heparin 1000 UNIT/ML injection Inject 3,000 Units into the vein 4 (four) times a week.   . hydrALAZINE (APRESOLINE) 100 MG tablet TAKE 1 TABLET(100 MG) BY MOUTH THREE TIMES DAILY  . hydrOXYzine (ATARAX/VISTARIL) 25 MG tablet Take 1 tablet (25 mg total) by mouth 4 (four) times daily as needed for itching.  . Ipratropium-Albuterol (COMBIVENT IN) Inhale 2 puffs into the lungs every 6 (six) hours as needed (as needed for shortness of breath).   . iron sucrose (VENOFER) 20 MG/ML injection Inject 100 mg into the vein every Monday.   . Lacosamide (VIMPAT) 100 MG TABS Take 1 tablet (100 mg total) by mouth 2 (two) times daily.  Marland Kitchen lacosamide (VIMPAT) 50 MG TABS tablet Take one half pill (25 mg) after hemodialysis  . multivitamin (RENA-VIT) TABS tablet Take 1 tablet by mouth daily.    Marland Kitchen omeprazole (PRILOSEC) 20 MG capsule Take 1 capsule (20 mg total) by mouth daily.  . traZODone (DESYREL) 50 MG tablet TAKE 1 TABLET(50 MG) BY MOUTH AT BEDTIME AS NEEDED FOR SLEEP  . VOLTAREN 1 % GEL APPLY 2 GRAMS EXTERNALLY TO THE AFFECTED AREA FOUR TIMES DAILY     Allergies:   Lisinopril; Penicillins; Fentanyl; and Morphine and related   Social History   Socioeconomic History  . Marital status: Single    Spouse name: Not on file  . Number of children: Not on file  . Years of education: Not on file  . Highest education level: Not on file  Occupational History  . Not on file  Social Needs  . Financial resource strain: Not on file  . Food insecurity:    Worry: Not on file    Inability: Not on file  . Transportation needs:    Medical: Not on file    Non-medical: Not on file  Tobacco Use  . Smoking status: Former Smoker    Years: 10.00    Types: Cigarettes    Last attempt to quit: 08/08/2015    Years since quitting: 2.8  . Smokeless tobacco: Never Used  . Tobacco comment: casual smoking for 10 years   Substance and Sexual Activity  . Alcohol use: No    Alcohol/week: 0.0 standard drinks    Comment: quit 2004  . Drug use: No    Comment: uds (+) cocaine in 08/2011 but pt states was taking sudafed at the time  . Sexual activity: Not Currently    Partners: Male  Lifestyle  . Physical activity:    Days per week: Not on file    Minutes per session: Not on  file  . Stress: Not on file  Relationships  . Social connections:    Talks on phone: Not on file    Gets together: Not on file    Attends religious service: Not on file    Active member of club or organization: Not on file    Attends meetings of clubs or organizations: Not on file    Relationship status: Not on file  Other Topics Concern  . 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 ROS:   Please see the history of present illness.     All other systems reviewed and are negative.  EKGs/Labs/Other Studies Reviewed:    The following studies were reviewed today:  TEE 05/05/18   TTE 05/03/18 Study Conclusions - Left ventricle: The LV apex is trabeculated and there is a false   tendon in the LV apex. Findings suggestive of non compaction. The   cavity size was mildly dilated. There was severe concentric   hypertrophy. Systolic function was normal. The estimated ejection   fraction was in the range of 55% to 60%. Wall motion was normal;   there were no regional wall motion abnormalities. Features are   consistent with a pseudonormal left ventricular filling pattern,   with concomitant abnormal relaxation and increased filling   pressure (grade 2 diastolic dysfunction). Doppler parameters are   consistent with high ventricular  filling pressure. - Aortic valve: There was moderate to severe regurgitation. Valve   area (VTI): 3.43 cm^2. Valve area (Vmean): 3.45 cm^2. - Mitral valve: There was mild regurgitation. - Left atrium: The atrium was severely dilated. - Right ventricle: The cavity size was mildly dilated. Wall   thickness was normal. - Pulmonary arteries: PA peak pressure: 62 mm Hg (S). - Pericardium, extracardiac: A trivial pericardial effusion was   identified posterior to the heart. There was a left pleural   effusion.  Impressions: - Normal LVF with severe LVH and mild LVE. There is a left pleural   effusion and trivial pericardial effusion posteriorly. There is   mild MR and trivial PR. The AV is not well visualized but there   appears to be moderate to severe AI of unclear etiology. Suggest   TEE to further evaluated AV. Consider Cardiac MRI to evaluate for   non compaction LV.  Echo 03/09/2017 Severe LVH, EF 65-70, normal wall motion, grade 1 diastolic dysfunction, moderate AI  Cardiac catheterization 02/17/2017 LM ostial 35 LAD moderately calcified, mild nonobstructive disease LCx mild disease RCA mild disease; severely calcified 1. Heavily calcified coronary arteries with mild diffuse nonobstructive CAD 2. Severe aortic valve insufficiency 3. Normal right heart hemodynamics 4. Normal LV systolic function  Transesophageal echocardiogram 09/11/2016 EF 60-65, severe AI, mild MR  Echo 08/08/16 Mod conc LVH, EF 60-65, no RWMA, Gr 2 DD, Ao sclerosis without stenosis, mod AI, mild MR, severe LAE, mild RV dilation, normal RVSF, mild RAE  Echo 04/09/2016 Moderate LVH, EF 60-65, normal wall motion, grade 1 diastolic dysfunction, mild AI, MAC, moderate LAE, trivial pericardial effusion   EKG:  EKG is not ordered today.    Recent Labs: 10/14/2017: TSH 3.325 03/07/2018: Magnesium 1.5 05/27/2018:  B Natriuretic Peptide 1,135.9 06/04/2018: ALT 9; BUN 40; Creatinine, Ser 10.30; Hemoglobin 10.3;  Platelets 148; Potassium 3.3; Sodium 133   Recent Lipid Panel    Component Value Date/Time   CHOL 120 11/01/2017 1008   TRIG 106 11/01/2017 1008   HDL 29 (L) 11/01/2017 1008   CHOLHDL 4.1 11/01/2017 1008   VLDL 5 02/10/2017 1619   LDLCALC 72 11/01/2017 1008    Physical Exam:    VS:  BP (!) 180/70   Pulse 65   Ht 6' (1.829 m)   Wt 164 lb (74.4 kg)   SpO2 97%   BMI 22.24 kg/m     Wt Readings from Last 3 Encounters:  07/01/18 164 lb (74.4 kg)  06/29/18 172 lb (78 kg)  06/28/18 172 lb (78 kg)     Physical Exam  Constitutional: He is oriented to person, place, and time. He appears well-developed and well-nourished. No distress.  HENT:  Head: Normocephalic and atraumatic.  Neck: Normal range of motion. Neck supple. No JVD present.  Cardiovascular: Normal rate, regular rhythm and intact distal pulses. Exam reveals no gallop and no friction rub.  Murmur heard.  Blowing early diastolic murmur is present with a grade of 2/6 at the upper right sternal border radiating to the apex. Pulmonary/Chest: Effort normal and breath sounds normal. No respiratory distress. He has no wheezes. He has no rales.  Abdominal: Soft. Bowel sounds are normal.  Musculoskeletal: Normal range of motion. He exhibits no edema.  Left arm dialysis AV fistula  Neurological: He is alert and oriented to person, place, and time.  Skin: Skin is warm and dry.  Psychiatric: He has a normal mood and affect. His behavior is normal. Judgment and thought content normal.    ASSESSMENT:    1. Nonrheumatic aortic valve insufficiency   2. Coronary artery disease involving native coronary artery of native heart without angina pectoris   3. End stage renal disease on home HD   4. Essential hypertension   5. Anemia of chronic disease   6. OSA (obstructive sleep apnea)    PLAN:    In order of problems listed above:  Nonrheumatic aortic valve insufficiency -recurrent aortic valve endocarditis with severe aortic  insufficiency, most recent episode of bacteremia in August. -He has completed his antibiotics and now planned for aortic valve replacement on October 30 -He is here today to arrange for cardiac catheterization in preparation for surgery -He has a tight schedule next week with multiple procedures.  He wants to wait till the following week.  He has been scheduled for 07/11/2018 -He will likely need dialysis after the procedure.  I will leave a message for Dr. Holley Raring who is his nephrologist to contact the patient regarding any timing issues for dialysis.  The patient's brother says that he can do dialysis at any time at home. -The patient, his brother and I have fully discussed the procedure and he is willing to proceed.  The patient understands that risks included but are not limited to stroke (1 in 1000), death (1 in 39), kidney failure [usually temporary] (1 in 500), bleeding (1 in 200), allergic reaction [possibly serious] (1 in 200).   Coronary artery disease -Mild nonobstructive coronary disease by cardiac catheterization in 2018. -Plan to repeat cardiac catheterization to reevaluate coronary arteries prior to valve surgery  End-stage renal disease on home hemodialysis -Home hemodialysis 4 times per week managed by his brother  Essential hypertension -BP fair control  Anemia of chronic disease, ESRD -  He has had significant issues with anemia requiring transfusion in the setting of upper GI bleeding.  Hemoglobin down to 6.8 in August.  He has had multiple GI procedures.  Most recent labs on 06/04/2018 with hemoglobin stable at 10.3.  Obstructive sleep apnea -Per recent sleep study.  The patient is in the process of obtaining his CPAP machine.  He has an appointment for his orientation.  Medication Adjustments/Labs and Tests Ordered: Current medicines are reviewed at length with the patient today.  Concerns regarding medicines are outlined above. Labs and tests ordered and medication  changes are outlined in the patient instructions below:  Patient Instructions  Medication Instructions:  Your physician recommends that you continue on your current medications as directed. Please refer to the Current Medication list given to you today.  If you need a refill on your cardiac medications before your next appointment, please call your pharmacy.   Lab work: FUTURE: BMET & CBC on 07/06/18 our lab is open between 7:30 AM-5 PM  Testing/Procedures: You are scheduled to have a left and right heart cath on 07/11/18  Follow-Up: At [facility], you and your health needs are our priority.  As part of our continuing mission to provide you with exceptional heart care, we have created designated Provider Care Teams.  These Care Teams include your primary Cardiologist (physician) and Advanced Practice Providers (APPs -  Physician Assistants and Nurse Practitioners) who all work together to provide you with the care you need, when you need it. You will need a follow up appointment in:  3rd week in November.  You may see Sherren Mocha, MD or one of the following Advanced Practice Providers on your designated Care Team: Richardson Dopp, PA-C [place], [place] . Daune Perch, NP  Any Other Special Instructions Will Be Listed Below (If Applicable).    [place] OFFICE [place], [place] [address] Dept: [phone] Loc: [place]  07/01/2018  You are scheduled for a Cardiac Catheterization on Monday, October 21 with Dr. Lauree Chandler.  1. Please arrive at the [facility] (Main Entrance A) at [facility]: [address] [place], [place] [address] at 8:30 AM (This time is two hours before your procedure to ensure your preparation). Free valet parking service is available.   Special note: Every effort is made to have your procedure done on time. Please  understand that emergencies sometimes delay scheduled procedures.  2. Diet: Do not eat solid foods after midnight.  The patient may have clear liquids until 5am upon the day of the procedure.  3. Labs: You will need to have blood drawn on Wednesday, October 16 at [facility] at [facility]. [address]  Open: 7:30am - 5pm    Phone: [phone]. You do not need to be fasting.   Contrast Allergy: No  *For reference purposes while preparing patient instructions.   Delete this med list prior to printing instructions for patient.*  On the morning of your procedure, take your Aspirin and any morning medicines NOT listed above.  You may use sips of water.  5. Plan for one night stay--bring personal belongings. 6. Bring a current list of your medications and current insurance cards. 7. You MUST have a responsible person to drive you home. 8. Someone MUST be with you the first 24 hours after you arrive home or your discharge will be delayed.  9. Please wear clothes that are easy to get on and off and wear slip-on shoes.  Thank you for allowing [name] to care for you!   -- [facility] Invasive Cardiovascular services       Signed, Daune Perch, NP  07/01/2018 3:44 PM    [place]

## 2018-07-01 NOTE — H&P (View-Only) (Signed)
Cardiology Office Note:    Date:  07/01/2018   ID:  Jeffrey [name], DOB 03-06-1956, MRN [id number]  PCP:  Velna Ochs, MD  Cardiologist:  Sherren Mocha, MD  Referring MD: Velna Ochs, MD   Chief Complaint  Patient presents with  . Aortic Insuffiency    History of Present Illness:    Jeffrey [name] is a 62 y.o. male with a past medical history significant for recurrent aortic valve endocarditis with severe aortic insufficiency, end-stage renal disease on hemodialysis (4 times a week; at home), HIV, hypertension, anemia of chronic disease, hypothyroidism, peripheral arterial disease, coronary artery disease, hepatitis B and tubulovillous adenoma of the duodenum.  Cardiac catheterization in May 2018 demonstrated heavily calcified coronary arteries with mild diffuse nonobstructive CAD and severe aortic insufficiency.  Follow-up echocardiograms have demonstrated improved aortic insufficiency.  He was previously evaluated by Dr. Roxan Hockey with [facility].  Given the improvement in his aortic insufficiency, valve replacement was not recommended.    He continued to do well until the summer when he developed shortness of breath and was admitted to the hospital in August.  He had methicillin-resistant staph epidermidis bacteremia.  Transesophageal echocardiogram showed a small vegetation and severe aortic insufficiency.  He was treated with vancomycin.  He was evaluated by CT surgery in September and recommended completion of antibiotics prior to undergoing valve surgery.  He was seen back in the cardiothoracic surgery office on 06/28/2018 by Dr. Roxan Hockey.  He has now completed his antibiotics and is afebrile.  He was noted to have dyspnea on mild exertion. He is planned for aortic valve replacement on 07/20/2018 with Dr. Roxan Hockey and he is here today for arrangement of cardiac catheterization in preparation for surgery.  He is also scheduled for revision of his AV fistula on  07/04/2018 Scheduled for cataract surgery on 10/17. Follow up for that on 10/18.   Today he is here with his brother who is his caregiver. He is dyspneic with activity. Some days he can do some activity. Yesterday he could walk up his stairs and down the hall with his physical therapist, but today he is not able to walk across the room. He is shaky and unsteady. He is not short of breath today. At night he sometimes wakes short of breath. He denies chest pain/tightness/pressure. No edema.   Recent sleep study positive for OSA and he is in process of getting CPAP.   Dr. Holley Raring is his nephrologist.    Past Medical History:  Diagnosis Date  . Anemia   . Anxiety   . Arthritis   . Asthma    per pt hx  . End stage renal disease on home HD 07/10/2011   Started HD in September 2012 at [facility] with a tunneled HD catheter, now on home HD with [facility]. Dialyzing through AVF L lower arm with buttonhole technique as of mid 2014. His brother does the HD treatments at home.  They are roommates for 23 years.  The brother works 3rd shift and gets off about 8am and then puts [name] on HD in the morning after getting home. Most of the time he does HD about 4 times a week, for about 4 hours per treatment. Cause of ESRD was HTN according to patient. He says he let his health go and ending up with complications, and that he didn't like seeing doctors in those days.  He says he was diagnosed with severe HTN when he lived in [place] in his 31's.   Marland Kitchen  Hepatitis B carrier ([place])   . HIV infection ([place])   . Hypertension   . Hyperthyroidism    normal now  . Pneumonia several yrs ago  . Seizure ([place])   . Seizures ([place]) 06/02/2011   x 1 none since  . Thrombocytopenia ([place])     Past Surgical History:  Procedure Laterality Date  . AV FISTULA PLACEMENT  06/02/11   Left radiocephalic AVF  . BIOPSY  02/17/2018   Procedure: BIOPSY;  Surgeon: Milus Banister, MD;  Location: [facility] ENDOSCOPY;  Service: Endoscopy;;  .  COLONOSCOPY    . COLONOSCOPY WITH PROPOFOL N/A 01/27/2018   Procedure: COLONOSCOPY WITH PROPOFOL;  Surgeon: [name], MD;  Location: [facility] ENDOSCOPY;  Service: Gastroenterology;  Laterality: N/A;  . CORONARY ANGIOPLASTY    . ERCP  03/21/2018   AT [place]  . ESOPHAGOGASTRODUODENOSCOPY N/A 02/17/2018   Procedure: ESOPHAGOGASTRODUODENOSCOPY (EGD);  Surgeon: Milus Banister, MD;  Location: [facility];  Service: Endoscopy;  Laterality: N/A;  . ESOPHAGOGASTRODUODENOSCOPY (EGD) WITH PROPOFOL N/A 01/27/2018   Procedure: ESOPHAGOGASTRODUODENOSCOPY (EGD) WITH PROPOFOL;  Surgeon: [name], MD;  Location: [facility] ENDOSCOPY;  Service: Gastroenterology;  Laterality: N/A;  . ESOPHAGOGASTRODUODENOSCOPY (EGD) WITH PROPOFOL N/A 04/03/2018   Procedure: ESOPHAGOGASTRODUODENOSCOPY (EGD) WITH PROPOFOL;  Surgeon: Clarene Essex, MD;  Location: [place];  Service: Endoscopy;  Laterality: N/A;  . EUS N/A 02/17/2018   Procedure: UPPER ENDOSCOPIC ULTRASOUND (EUS) RADIAL;  Surgeon: Milus Banister, MD;  Location: [facility] ENDOSCOPY;  Service: Endoscopy;  Laterality: N/A;  . fistulaogram     x 2 last 2 years  . IR DIALY SHUNT INTRO NEEDLE/INTRACATH INITIAL W/IMG LEFT Left 04/20/2018  . IR FLUORO GUIDE CV LINE RIGHT  09/16/2017  . IR US GUIDE VASC ACCESS RIGHT  09/16/2017  . IRRIGATION AND DEBRIDEMENT KNEE Left 09/15/2017   Procedure: IRRIGATION AND DEBRIDEMENT KNEE; arthroscopic clean out;  Surgeon: [name], MD;  Location: [facility] ORS;  Service: Orthopedics;  Laterality: Left;  . OTHER SURGICAL HISTORY     removal temporary HD catheter   . REVISON OF ARTERIOVENOUS FISTULA Left 10/10/2015   Procedure: REVISON OF LEFT RADIOCEPHALIC ARTERIOVENOUS FISTULA;  Surgeon: Angelia Mould, MD;  Location: [place];  Service: Vascular;  Laterality: Left;  . REVISON OF ARTERIOVENOUS FISTULA Left 02/07/2016   Procedure: REPAIR OF PSEUDO-ANEUREYSM OF LEFT ARM  ARTERIOVENOUS FISTULA;  Surgeon: Angelia Mould, MD;  Location: [place];  Service: Vascular;  Laterality: Left;  . REVISON OF ARTERIOVENOUS FISTULA Left 4/48/1856   Procedure: PLICATION OF LEFT ARM RADIOCEPHALIC ARTERIOVENOUS FISTULA PSEUDOANEURYSM;  Surgeon: Angelia Mould, MD;  Location: [place];  Service: Vascular;  Laterality: Left;  . RIGHT/LEFT HEART CATH AND CORONARY ANGIOGRAPHY N/A 02/17/2017   Procedure: Right/Left Heart Cath and Coronary Angiography;  Surgeon: Sherren Mocha, MD;  Location: [place] CV LAB;  Service: Cardiovascular;  Laterality: N/A;  . TEE WITHOUT CARDIOVERSION N/A 09/11/2016   Procedure: TRANSESOPHAGEAL ECHOCARDIOGRAM (TEE);  Surgeon: Dorothy Spark, MD;  Location: [place];  Service: Cardiovascular;  Laterality: N/A;  . TEE WITHOUT CARDIOVERSION N/A 05/05/2018   Procedure: TRANSESOPHAGEAL ECHOCARDIOGRAM (TEE);  Surgeon: Adrian Prows, MD;  Location: [facility] ENDOSCOPY;  Service: Cardiovascular;  Laterality: N/A;  Prefer after 3:30 PM    Current Medications: Current Meds  Medication Sig  . albuterol (PROVENTIL) (2.5 MG/3ML) 0.083% nebulizer solution Take 2.5 mg by nebulization every 6 (six) hours as needed for wheezing or shortness of breath.  . ALPRAZolam (XANAX) 0.25 MG tablet Take by mouth at  bedtime as needed for anxiety.  . bictegravir-emtricitabine-tenofovir AF (BIKTARVY) 50-200-25 MG TABS tablet Take 1 tablet by mouth daily.  . calcium acetate (PHOSLO) 667 MG capsule Take 667-2,001 mg by mouth See admin instructions. Take 1,334 mg three times a day with each meal and 667 mg with each snack  . carvedilol (COREG) 25 MG tablet Take 1 tablet (25 mg total) by mouth 2 (two) times daily with a meal.  . cloNIDine (CATAPRES - DOSED IN MG/24 HR) 0.3 mg/24hr patch Place 1 patch (0.3 mg total) onto the skin once a week.  . cyclobenzaprine (FLEXERIL) 10 MG tablet Take 1 tablet (10 mg total) by mouth at bedtime as needed for muscle spasms.  Marland Kitchen epoetin alfa (EPOGEN,PROCRIT) 2000 UNIT/ML injection Inject 2,000 Units into the vein 3 (three)  times a week.   . ethyl chloride spray Apply 1 application topically daily as needed (HD- to numb).   . heparin 1000 UNIT/ML injection Inject 3,000 Units into the vein 4 (four) times a week.   . hydrALAZINE (APRESOLINE) 100 MG tablet TAKE 1 TABLET(100 MG) BY MOUTH THREE TIMES DAILY  . hydrOXYzine (ATARAX/VISTARIL) 25 MG tablet Take 1 tablet (25 mg total) by mouth 4 (four) times daily as needed for itching.  . Ipratropium-Albuterol (COMBIVENT IN) Inhale 2 puffs into the lungs every 6 (six) hours as needed (as needed for shortness of breath).   . iron sucrose (VENOFER) 20 MG/ML injection Inject 100 mg into the vein every Monday.   . Lacosamide (VIMPAT) 100 MG TABS Take 1 tablet (100 mg total) by mouth 2 (two) times daily.  Marland Kitchen lacosamide (VIMPAT) 50 MG TABS tablet Take one half pill (25 mg) after hemodialysis  . multivitamin (RENA-VIT) TABS tablet Take 1 tablet by mouth daily.    Marland Kitchen omeprazole (PRILOSEC) 20 MG capsule Take 1 capsule (20 mg total) by mouth daily.  . traZODone (DESYREL) 50 MG tablet TAKE 1 TABLET(50 MG) BY MOUTH AT BEDTIME AS NEEDED FOR SLEEP  . VOLTAREN 1 % GEL APPLY 2 GRAMS EXTERNALLY TO THE AFFECTED AREA FOUR TIMES DAILY     Allergies:   Lisinopril; Penicillins; Fentanyl; and Morphine and related   Social History   Socioeconomic History  . Marital status: Single    Spouse name: Not on file  . Number of children: Not on file  . Years of education: Not on file  . Highest education level: Not on file  Occupational History  . Not on file  Social Needs  . Financial resource strain: Not on file  . Food insecurity:    Worry: Not on file    Inability: Not on file  . Transportation needs:    Medical: Not on file    Non-medical: Not on file  Tobacco Use  . Smoking status: Former Smoker    Years: 10.00    Types: Cigarettes    Last attempt to quit: 08/08/2015    Years since quitting: 2.8  . Smokeless tobacco: Never Used  . Tobacco comment: casual smoking for 10 years   Substance and Sexual Activity  . Alcohol use: No    Alcohol/week: 0.0 standard drinks    Comment: quit 2004  . Drug use: No    Comment: uds (+) cocaine in 08/2011 but pt states was taking sudafed at the time  . Sexual activity: Not Currently    Partners: Male  Lifestyle  . Physical activity:    Days per week: Not on file    Minutes per session: Not on  file  . Stress: Not on file  Relationships  . Social connections:    Talks on phone: Not on file    Gets together: Not on file    Attends religious service: Not on file    Active member of club or organization: Not on file    Attends meetings of clubs or organizations: Not on file    Relationship status: Not on file  Other Topics Concern  . 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 ROS:   Please see the history of present illness.     All other systems reviewed and are negative.  EKGs/Labs/Other Studies Reviewed:    The following studies were reviewed today:  TEE 05/05/18   TTE 05/03/18 Study Conclusions - Left ventricle: The LV apex is trabeculated and there is a false   tendon in the LV apex. Findings suggestive of non compaction. The   cavity size was mildly dilated. There was severe concentric   hypertrophy. Systolic function was normal. The estimated ejection   fraction was in the range of 55% to 60%. Wall motion was normal;   there were no regional wall motion abnormalities. Features are   consistent with a pseudonormal left ventricular filling pattern,   with concomitant abnormal relaxation and increased filling   pressure (grade 2 diastolic dysfunction). Doppler parameters are   consistent with high ventricular  filling pressure. - Aortic valve: There was moderate to severe regurgitation. Valve   area (VTI): 3.43 cm^2. Valve area (Vmean): 3.45 cm^2. - Mitral valve: There was mild regurgitation. - Left atrium: The atrium was severely dilated. - Right ventricle: The cavity size was mildly dilated. Wall   thickness was normal. - Pulmonary arteries: PA peak pressure: 62 mm Hg (S). - Pericardium, extracardiac: A trivial pericardial effusion was   identified posterior to the heart. There was a left pleural   effusion.  Impressions: - Normal LVF with severe LVH and mild LVE. There is a left pleural   effusion and trivial pericardial effusion posteriorly. There is   mild MR and trivial PR. The AV is not well visualized but there   appears to be moderate to severe AI of unclear etiology. Suggest   TEE to further evaluated AV. Consider Cardiac MRI to evaluate for   non compaction LV.  Echo 03/09/2017 Severe LVH, EF 65-70, normal wall motion, grade 1 diastolic dysfunction, moderate AI  Cardiac catheterization 02/17/2017 LM ostial 35 LAD moderately calcified, mild nonobstructive disease LCx mild disease RCA mild disease; severely calcified 1. Heavily calcified coronary arteries with mild diffuse nonobstructive CAD 2. Severe aortic valve insufficiency 3. Normal right heart hemodynamics 4. Normal LV systolic function  Transesophageal echocardiogram 09/11/2016 EF 60-65, severe AI, mild MR  Echo 08/08/16 Mod conc LVH, EF 60-65, no RWMA, Gr 2 DD, Ao sclerosis without stenosis, mod AI, mild MR, severe LAE, mild RV dilation, normal RVSF, mild RAE  Echo 04/09/2016 Moderate LVH, EF 60-65, normal wall motion, grade 1 diastolic dysfunction, mild AI, MAC, moderate LAE, trivial pericardial effusion   EKG:  EKG is not ordered today.    Recent Labs: 10/14/2017: TSH 3.325 03/07/2018: Magnesium 1.5 05/27/2018:  B Natriuretic Peptide 1,135.9 06/04/2018: ALT 9; BUN 40; Creatinine, Ser 10.30; Hemoglobin 10.3;  Platelets 148; Potassium 3.3; Sodium 133   Recent Lipid Panel    Component Value Date/Time   CHOL 120 11/01/2017 1008   TRIG 106 11/01/2017 1008   HDL 29 (L) 11/01/2017 1008   CHOLHDL 4.1 11/01/2017 1008   VLDL 5 02/10/2017 1619   LDLCALC 72 11/01/2017 1008    Physical Exam:    VS:  BP (!) 180/70   Pulse 65   Ht 6' (1.829 m)   Wt 164 lb (74.4 kg)   SpO2 97%   BMI 22.24 kg/m     Wt Readings from Last 3 Encounters:  07/01/18 164 lb (74.4 kg)  06/29/18 172 lb (78 kg)  06/28/18 172 lb (78 kg)     Physical Exam  Constitutional: He is oriented to person, place, and time. He appears well-developed and well-nourished. No distress.  HENT:  Head: Normocephalic and atraumatic.  Neck: Normal range of motion. Neck supple. No JVD present.  Cardiovascular: Normal rate, regular rhythm and intact distal pulses. Exam reveals no gallop and no friction rub.  Murmur heard.  Blowing early diastolic murmur is present with a grade of 2/6 at the upper right sternal border radiating to the apex. Pulmonary/Chest: Effort normal and breath sounds normal. No respiratory distress. He has no wheezes. He has no rales.  Abdominal: Soft. Bowel sounds are normal.  Musculoskeletal: Normal range of motion. He exhibits no edema.  Left arm dialysis AV fistula  Neurological: He is alert and oriented to person, place, and time.  Skin: Skin is warm and dry.  Psychiatric: He has a normal mood and affect. His behavior is normal. Judgment and thought content normal.    ASSESSMENT:    1. Nonrheumatic aortic valve insufficiency   2. Coronary artery disease involving native coronary artery of native heart without angina pectoris   3. End stage renal disease on home HD   4. Essential hypertension   5. Anemia of chronic disease   6. OSA (obstructive sleep apnea)    PLAN:    In order of problems listed above:  Nonrheumatic aortic valve insufficiency -recurrent aortic valve endocarditis with severe aortic  insufficiency, most recent episode of bacteremia in August. -He has completed his antibiotics and now planned for aortic valve replacement on October 30 -He is here today to arrange for cardiac catheterization in preparation for surgery -He has a tight schedule next week with multiple procedures.  He wants to wait till the following week.  He has been scheduled for 07/11/2018 -He will likely need dialysis after the procedure.  I will leave a message for Dr. Holley Raring who is his nephrologist to contact the patient regarding any timing issues for dialysis.  The patient's brother says that he can do dialysis at any time at home. -The patient, his brother and I have fully discussed the procedure and he is willing to proceed.  The patient understands that risks included but are not limited to stroke (1 in 1000), death (1 in 39), kidney failure [usually temporary] (1 in 500), bleeding (1 in 200), allergic reaction [possibly serious] (1 in 200).   Coronary artery disease -Mild nonobstructive coronary disease by cardiac catheterization in 2018. -Plan to repeat cardiac catheterization to reevaluate coronary arteries prior to valve surgery  End-stage renal disease on home hemodialysis -Home hemodialysis 4 times per week managed by his brother  Essential hypertension -BP fair control  Anemia of chronic disease, ESRD -  He has had significant issues with anemia requiring transfusion in the setting of upper GI bleeding.  Hemoglobin down to 6.8 in August.  He has had multiple GI procedures.  Most recent labs on 06/04/2018 with hemoglobin stable at 10.3.  Obstructive sleep apnea -Per recent sleep study.  The patient is in the process of obtaining his CPAP machine.  He has an appointment for his orientation.  Medication Adjustments/Labs and Tests Ordered: Current medicines are reviewed at length with the patient today.  Concerns regarding medicines are outlined above. Labs and tests ordered and medication  changes are outlined in the patient instructions below:  Patient Instructions  Medication Instructions:  Your physician recommends that you continue on your current medications as directed. Please refer to the Current Medication list given to you today.  If you need a refill on your cardiac medications before your next appointment, please call your pharmacy.   Lab work: FUTURE: BMET & CBC on 07/06/18 our lab is open between 7:30 AM-5 PM  Testing/Procedures: You are scheduled to have a left and right heart cath on 07/11/18  Follow-Up: At [facility], you and your health needs are our priority.  As part of our continuing mission to provide you with exceptional heart care, we have created designated Provider Care Teams.  These Care Teams include your primary Cardiologist (physician) and Advanced Practice Providers (APPs -  Physician Assistants and Nurse Practitioners) who all work together to provide you with the care you need, when you need it. You will need a follow up appointment in:  3rd week in November.  You may see Sherren Mocha, MD or one of the following Advanced Practice Providers on your designated Care Team: Richardson Dopp, PA-C [place], [place] . Daune Perch, NP  Any Other Special Instructions Will Be Listed Below (If Applicable).    [place] OFFICE [place], [place] [address] Dept: [phone] Loc: [place]  07/01/2018  You are scheduled for a Cardiac Catheterization on Monday, October 21 with Dr. Lauree Chandler.  1. Please arrive at the [facility] (Main Entrance A) at [facility]: [address] [place], [place] [address] at 8:30 AM (This time is two hours before your procedure to ensure your preparation). Free valet parking service is available.   Special note: Every effort is made to have your procedure done on time. Please  understand that emergencies sometimes delay scheduled procedures.  2. Diet: Do not eat solid foods after midnight.  The patient may have clear liquids until 5am upon the day of the procedure.  3. Labs: You will need to have blood drawn on Wednesday, October 16 at [facility] at [facility]. [address]  Open: 7:30am - 5pm    Phone: [phone]. You do not need to be fasting.   Contrast Allergy: No  *For reference purposes while preparing patient instructions.   Delete this med list prior to printing instructions for patient.*  On the morning of your procedure, take your Aspirin and any morning medicines NOT listed above.  You may use sips of water.  5. Plan for one night stay--bring personal belongings. 6. Bring a current list of your medications and current insurance cards. 7. You MUST have a responsible person to drive you home. 8. Someone MUST be with you the first 24 hours after you arrive home or your discharge will be delayed.  9. Please wear clothes that are easy to get on and off and wear slip-on shoes.  Thank you for allowing [name] to care for you!   -- [facility] Invasive Cardiovascular services       Signed, Daune Perch, NP  07/01/2018 3:44 PM    [place]

## 2018-07-03 NOTE — Anesthesia Preprocedure Evaluation (Addendum)
Anesthesia Evaluation  Patient identified by MRN, date of birth, ID band Patient awake    Reviewed: Allergy & Precautions, H&P , NPO status , Patient's Chart, lab work & pertinent test results  Airway Mallampati: II  TM Distance: >3 FB Neck ROM: Full    Dental no notable dental hx. (+) Edentulous Upper, Edentulous Lower   Pulmonary asthma , sleep apnea , COPD, former smoker,    Pulmonary exam normal breath sounds clear to auscultation       Cardiovascular Exercise Tolerance: Good hypertension, Pt. on medications + CAD  Normal cardiovascular exam Rhythm:Regular Rate:Normal  ECHo 05/03/18  Left ventricle: The LV apex is trabeculated and there is a false   tendon in the LV apex. Findings suggestive of non compaction. The   cavity size was mildly dilated. There was severe concentric   hypertrophy. Systolic function was normal. The estimated ejection   fraction was in the range of 55% to 60%. Wall motion was normal;   there were no regional wall motion abnormalities. Features are   consistent with a pseudonormal left ventricular filling pattern,   with concomitant abnormal relaxation and increased filling   pressure (grade 2 diastolic dysfunction). Doppler parameters are   consistent with high ventricular filling pressure.   Neuro/Psych    GI/Hepatic (+) Hepatitis -  Endo/Other  Hyperthyroidism   Renal/GU Renal disease     Musculoskeletal   Abdominal   Peds  Hematology  (+) Blood dyscrasia, anemia ,   Anesthesia Other Findings   Reproductive/Obstetrics                            Anesthesia Physical Anesthesia Plan  ASA: III  Anesthesia Plan: MAC   Post-op Pain Management:    Induction: Intravenous  PONV Risk Score and Plan: Treatment may vary due to age or medical condition  Airway Management Planned: Natural Airway and Awake Intubation Planned  Additional Equipment:   Intra-op  Plan:   Post-operative Plan:   Informed Consent: I have reviewed the patients History and Physical, chart, labs and discussed the procedure including the risks, benefits and alternatives for the proposed anesthesia with the patient or authorized representative who has indicated his/her understanding and acceptance.   Dental advisory given  Plan Discussed with:   Anesthesia Plan Comments:        Anesthesia Quick Evaluation

## 2018-07-04 ENCOUNTER — Ambulatory Visit (HOSPITAL_COMMUNITY): Payer: Medicare Other | Admitting: Anesthesiology

## 2018-07-04 ENCOUNTER — Encounter (HOSPITAL_COMMUNITY)
Admission: RE | Disposition: A | Discharge status: Home / Self Care | Payer: Self-pay | Source: Ambulatory Visit | Attending: Internal Medicine

## 2018-07-04 ENCOUNTER — Other Ambulatory Visit: Payer: Self-pay

## 2018-07-04 ENCOUNTER — Observation Stay (HOSPITAL_COMMUNITY)
Admission: RE | Admit: 2018-07-04 | Discharge: 2018-07-05 | Disposition: A | Discharge status: Home / Self Care | Payer: Medicare Other | Source: Ambulatory Visit | Attending: Internal Medicine | Admitting: Internal Medicine

## 2018-07-04 ENCOUNTER — Telehealth: Payer: Self-pay

## 2018-07-04 ENCOUNTER — Encounter (HOSPITAL_COMMUNITY): Payer: Self-pay | Admitting: *Deleted

## 2018-07-04 ENCOUNTER — Observation Stay (HOSPITAL_COMMUNITY): Payer: Medicare Other

## 2018-07-04 DIAGNOSIS — R918 Other nonspecific abnormal finding of lung field: Secondary | ICD-10-CM | POA: Diagnosis not present

## 2018-07-04 DIAGNOSIS — K603 Anal fistula: Secondary | ICD-10-CM | POA: Diagnosis not present

## 2018-07-04 DIAGNOSIS — N186 End stage renal disease: Secondary | ICD-10-CM | POA: Diagnosis not present

## 2018-07-04 DIAGNOSIS — I160 Hypertensive urgency: Secondary | ICD-10-CM | POA: Diagnosis not present

## 2018-07-04 DIAGNOSIS — Z9889 Other specified postprocedural states: Secondary | ICD-10-CM | POA: Diagnosis not present

## 2018-07-04 DIAGNOSIS — I2510 Atherosclerotic heart disease of native coronary artery without angina pectoris: Secondary | ICD-10-CM | POA: Insufficient documentation

## 2018-07-04 DIAGNOSIS — B181 Chronic viral hepatitis B without delta-agent: Secondary | ICD-10-CM | POA: Insufficient documentation

## 2018-07-04 DIAGNOSIS — M1990 Unspecified osteoarthritis, unspecified site: Secondary | ICD-10-CM | POA: Diagnosis not present

## 2018-07-04 DIAGNOSIS — F419 Anxiety disorder, unspecified: Secondary | ICD-10-CM | POA: Diagnosis not present

## 2018-07-04 DIAGNOSIS — Z880 Allergy status to penicillin: Secondary | ICD-10-CM | POA: Diagnosis not present

## 2018-07-04 DIAGNOSIS — J45909 Unspecified asthma, uncomplicated: Secondary | ICD-10-CM | POA: Diagnosis not present

## 2018-07-04 DIAGNOSIS — I120 Hypertensive chronic kidney disease with stage 5 chronic kidney disease or end stage renal disease: Secondary | ICD-10-CM | POA: Insufficient documentation

## 2018-07-04 DIAGNOSIS — G4730 Sleep apnea, unspecified: Secondary | ICD-10-CM | POA: Diagnosis not present

## 2018-07-04 DIAGNOSIS — Z885 Allergy status to narcotic agent status: Secondary | ICD-10-CM | POA: Insufficient documentation

## 2018-07-04 DIAGNOSIS — Z888 Allergy status to other drugs, medicaments and biological substances status: Secondary | ICD-10-CM | POA: Diagnosis not present

## 2018-07-04 DIAGNOSIS — Z992 Dependence on renal dialysis: Secondary | ICD-10-CM | POA: Diagnosis not present

## 2018-07-04 DIAGNOSIS — T82898A Other specified complication of vascular prosthetic devices, implants and grafts, initial encounter: Secondary | ICD-10-CM | POA: Diagnosis not present

## 2018-07-04 DIAGNOSIS — Z87891 Personal history of nicotine dependence: Secondary | ICD-10-CM | POA: Insufficient documentation

## 2018-07-04 DIAGNOSIS — T82838A Hemorrhage of vascular prosthetic devices, implants and grafts, initial encounter: Principal | ICD-10-CM | POA: Insufficient documentation

## 2018-07-04 DIAGNOSIS — B1910 Unspecified viral hepatitis B without hepatic coma: Secondary | ICD-10-CM | POA: Insufficient documentation

## 2018-07-04 DIAGNOSIS — Z955 Presence of coronary angioplasty implant and graft: Secondary | ICD-10-CM | POA: Diagnosis not present

## 2018-07-04 DIAGNOSIS — D696 Thrombocytopenia, unspecified: Secondary | ICD-10-CM | POA: Diagnosis not present

## 2018-07-04 DIAGNOSIS — B20 Human immunodeficiency virus [HIV] disease: Secondary | ICD-10-CM | POA: Diagnosis not present

## 2018-07-04 DIAGNOSIS — Z21 Asymptomatic human immunodeficiency virus [HIV] infection status: Secondary | ICD-10-CM

## 2018-07-04 DIAGNOSIS — Z79899 Other long term (current) drug therapy: Secondary | ICD-10-CM | POA: Diagnosis not present

## 2018-07-04 DIAGNOSIS — I352 Nonrheumatic aortic (valve) stenosis with insufficiency: Secondary | ICD-10-CM | POA: Diagnosis not present

## 2018-07-04 DIAGNOSIS — R569 Unspecified convulsions: Secondary | ICD-10-CM | POA: Insufficient documentation

## 2018-07-04 HISTORY — PX: REVISON OF ARTERIOVENOUS FISTULA: SHX6074

## 2018-07-04 HISTORY — DX: Sleep apnea, unspecified: G47.30

## 2018-07-04 HISTORY — DX: Nonrheumatic aortic (valve) stenosis: I35.0

## 2018-07-04 HISTORY — DX: Dyspnea, unspecified: R06.00

## 2018-07-04 LAB — POCT I-STAT 4, (NA,K, GLUC, HGB,HCT)
GLUCOSE: 96 mg/dL (ref 70–99)
HEMATOCRIT: 33 % — AB (ref 39.0–52.0)
Hemoglobin: 11.2 g/dL — ABNORMAL LOW (ref 13.0–17.0)
Potassium: 3.5 mmol/L (ref 3.5–5.1)
Sodium: 137 mmol/L (ref 135–145)

## 2018-07-04 LAB — MRSA PCR SCREENING: MRSA by PCR: NEGATIVE

## 2018-07-04 SURGERY — REVISON OF ARTERIOVENOUS FISTULA
Anesthesia: Monitor Anesthesia Care | Site: Arm Lower | Laterality: Left

## 2018-07-04 MED ORDER — LACOSAMIDE 50 MG PO TABS: 25.0000 mg | ORAL_TABLET | ORAL | Status: DC

## 2018-07-04 MED ORDER — PROPOFOL 500 MG/50ML IV EMUL
INTRAVENOUS | Status: DC | PRN
  Administered 2018-07-04: 50 ug/kg/min via INTRAVENOUS

## 2018-07-04 MED ORDER — HYDRALAZINE HCL 20 MG/ML IJ SOLN
10.0000 mg | Freq: Once | INTRAMUSCULAR | Status: AC
  Administered 2018-07-04: 10 mg via INTRAVENOUS

## 2018-07-04 MED ORDER — ONDANSETRON HCL 4 MG/2ML IJ SOLN: 4.0000 mg | Freq: Four times a day (QID) | INTRAMUSCULAR | Status: DC | PRN

## 2018-07-04 MED ORDER — PROMETHAZINE HCL 25 MG/ML IJ SOLN: 6.2500 mg | INTRAMUSCULAR | Status: DC | PRN

## 2018-07-04 MED ORDER — LACOSAMIDE 50 MG PO TABS
100.0000 mg | ORAL_TABLET | Freq: Two times a day (BID) | ORAL | Status: DC
  Administered 2018-07-04 – 2018-07-05 (×2): 100 mg via ORAL
  Filled 2018-07-04 (×2): qty 2

## 2018-07-04 MED ORDER — HYDRALAZINE HCL 20 MG/ML IJ SOLN
10.0000 mg | Freq: Four times a day (QID) | INTRAMUSCULAR | Status: DC | PRN
  Administered 2018-07-04: 10 mg via INTRAVENOUS

## 2018-07-04 MED ORDER — HYDRALAZINE HCL 20 MG/ML IJ SOLN
INTRAMUSCULAR | Status: AC
  Administered 2018-07-04: 10 mg via INTRAVENOUS
  Filled 2018-07-04: qty 1

## 2018-07-04 MED ORDER — FENTANYL CITRATE (PF) 100 MCG/2ML IJ SOLN
INTRAMUSCULAR | Status: DC | PRN
  Administered 2018-07-04: 50 ug via INTRAVENOUS

## 2018-07-04 MED ORDER — MIDAZOLAM HCL 5 MG/5ML IJ SOLN
INTRAMUSCULAR | Status: DC | PRN
  Administered 2018-07-04: 2 mg via INTRAVENOUS

## 2018-07-04 MED ORDER — LIDOCAINE 2% (20 MG/ML) 5 ML SYRINGE
INTRAMUSCULAR | Status: DC | PRN
  Administered 2018-07-04: 100 mg via INTRAVENOUS

## 2018-07-04 MED ORDER — ALBUTEROL SULFATE (2.5 MG/3ML) 0.083% IN NEBU: 2.5000 mg | INHALATION_SOLUTION | Freq: Four times a day (QID) | RESPIRATORY_TRACT | Status: DC | PRN

## 2018-07-04 MED ORDER — SODIUM CHLORIDE 0.9 % IV SOLN
INTRAVENOUS | Status: DC | PRN
  Administered 2018-07-04: 11:00:00

## 2018-07-04 MED ORDER — ROCURONIUM BROMIDE 50 MG/5ML IV SOSY
PREFILLED_SYRINGE | INTRAVENOUS | Status: AC
  Filled 2018-07-04: qty 5

## 2018-07-04 MED ORDER — CALCIUM ACETATE (PHOS BINDER) 667 MG PO CAPS
2001.0000 mg | ORAL_CAPSULE | Freq: Three times a day (TID) | ORAL | Status: DC
  Administered 2018-07-04 – 2018-07-05 (×2): 2001 mg via ORAL
  Filled 2018-07-04 (×2): qty 3

## 2018-07-04 MED ORDER — LIDOCAINE HCL (PF) 1 % IJ SOLN
INTRAMUSCULAR | Status: AC
  Filled 2018-07-04: qty 30

## 2018-07-04 MED ORDER — PROTAMINE SULFATE 10 MG/ML IV SOLN
INTRAVENOUS | Status: DC | PRN
  Administered 2018-07-04: 40 mg via INTRAVENOUS

## 2018-07-04 MED ORDER — HYDROXYZINE HCL 25 MG PO TABS
25.0000 mg | ORAL_TABLET | Freq: Four times a day (QID) | ORAL | Status: DC | PRN
  Administered 2018-07-04: 25 mg via ORAL
  Filled 2018-07-04: qty 1

## 2018-07-04 MED ORDER — ONDANSETRON HCL 4 MG PO TABS: 4.0000 mg | ORAL_TABLET | Freq: Four times a day (QID) | ORAL | Status: DC | PRN

## 2018-07-04 MED ORDER — MEPERIDINE HCL 50 MG/ML IJ SOLN: 6.2500 mg | INTRAMUSCULAR | Status: DC | PRN

## 2018-07-04 MED ORDER — HYDROMORPHONE HCL 1 MG/ML IJ SOLN
INTRAMUSCULAR | Status: AC
  Filled 2018-07-04: qty 1

## 2018-07-04 MED ORDER — PROPOFOL 10 MG/ML IV BOLUS
INTRAVENOUS | Status: DC | PRN
  Administered 2018-07-04: 20 mg via INTRAVENOUS

## 2018-07-04 MED ORDER — FENTANYL CITRATE (PF) 250 MCG/5ML IJ SOLN
INTRAMUSCULAR | Status: AC
  Filled 2018-07-04: qty 5

## 2018-07-04 MED ORDER — LIDOCAINE 2% (20 MG/ML) 5 ML SYRINGE
INTRAMUSCULAR | Status: AC
  Filled 2018-07-04: qty 5

## 2018-07-04 MED ORDER — RENA-VITE PO TABS
1.0000 | ORAL_TABLET | Freq: Every day | ORAL | Status: DC
  Administered 2018-07-04: 1 via ORAL
  Filled 2018-07-04: qty 1

## 2018-07-04 MED ORDER — VANCOMYCIN HCL IN DEXTROSE 1-5 GM/200ML-% IV SOLN
1000.0000 mg | INTRAVENOUS | Status: AC
  Administered 2018-07-04: 1000 mg via INTRAVENOUS
  Filled 2018-07-04: qty 200

## 2018-07-04 MED ORDER — SODIUM CHLORIDE 0.9 % IV SOLN: 250.0000 mL | INTRAVENOUS | Status: DC | PRN

## 2018-07-04 MED ORDER — TRAZODONE HCL 50 MG PO TABS
50.0000 mg | ORAL_TABLET | Freq: Every evening | ORAL | Status: DC | PRN
  Administered 2018-07-04: 50 mg via ORAL
  Filled 2018-07-04: qty 1

## 2018-07-04 MED ORDER — HYDROMORPHONE HCL 1 MG/ML IJ SOLN
0.2500 mg | INTRAMUSCULAR | Status: DC | PRN
  Administered 2018-07-04: 0.5 mg via INTRAVENOUS

## 2018-07-04 MED ORDER — MIDAZOLAM HCL 2 MG/2ML IJ SOLN
INTRAMUSCULAR | Status: AC
  Filled 2018-07-04: qty 2

## 2018-07-04 MED ORDER — SODIUM CHLORIDE 0.9 % IR SOLN
Status: DC | PRN
  Administered 2018-07-04: 1000 mL

## 2018-07-04 MED ORDER — ALPRAZOLAM 0.25 MG PO TABS: 0.2500 mg | ORAL_TABLET | Freq: Every evening | ORAL | Status: DC | PRN

## 2018-07-04 MED ORDER — HEPARIN SODIUM (PORCINE) 1000 UNIT/ML IJ SOLN
INTRAMUSCULAR | Status: DC | PRN
  Administered 2018-07-04: 6000 [IU] via INTRAVENOUS

## 2018-07-04 MED ORDER — ACETAMINOPHEN 10 MG/ML IV SOLN: 1000.0000 mg | Freq: Once | INTRAVENOUS | Status: DC | PRN

## 2018-07-04 MED ORDER — HYDRALAZINE HCL 50 MG PO TABS
100.0000 mg | ORAL_TABLET | Freq: Three times a day (TID) | ORAL | Status: DC
  Administered 2018-07-04 – 2018-07-05 (×3): 100 mg via ORAL
  Filled 2018-07-04 (×3): qty 2

## 2018-07-04 MED ORDER — DARBEPOETIN ALFA 25 MCG/0.42ML IJ SOSY: 25.0000 ug | PREFILLED_SYRINGE | INTRAMUSCULAR | Status: DC

## 2018-07-04 MED ORDER — LIDOCAINE-EPINEPHRINE (PF) 1 %-1:200000 IJ SOLN
INTRAMUSCULAR | Status: AC
  Filled 2018-07-04: qty 30

## 2018-07-04 MED ORDER — PANTOPRAZOLE SODIUM 40 MG PO TBEC
40.0000 mg | DELAYED_RELEASE_TABLET | Freq: Every day | ORAL | Status: DC
  Administered 2018-07-04 – 2018-07-05 (×2): 40 mg via ORAL
  Filled 2018-07-04 (×2): qty 1

## 2018-07-04 MED ORDER — SODIUM CHLORIDE 0.9 % IV SOLN: INTRAVENOUS | Status: DC

## 2018-07-04 MED ORDER — HYDROCODONE-ACETAMINOPHEN 7.5-325 MG PO TABS
1.0000 | ORAL_TABLET | ORAL | Status: DC | PRN
  Administered 2018-07-05: 1 via ORAL
  Filled 2018-07-04: qty 1

## 2018-07-04 MED ORDER — CLONIDINE HCL 0.2 MG PO TABS
0.2000 mg | ORAL_TABLET | Freq: Four times a day (QID) | ORAL | Status: DC | PRN
  Administered 2018-07-04 – 2018-07-05 (×2): 0.2 mg via ORAL
  Filled 2018-07-04 (×3): qty 1

## 2018-07-04 MED ORDER — PROTAMINE SULFATE 10 MG/ML IV SOLN
INTRAVENOUS | Status: AC
  Filled 2018-07-04: qty 5

## 2018-07-04 MED ORDER — PROPOFOL 10 MG/ML IV BOLUS
INTRAVENOUS | Status: AC
  Filled 2018-07-04: qty 20

## 2018-07-04 MED ORDER — HYDROCODONE-ACETAMINOPHEN 7.5-325 MG PO TABS
1.0000 | ORAL_TABLET | Freq: Once | ORAL | Status: AC | PRN
  Administered 2018-07-04: 1 via ORAL

## 2018-07-04 MED ORDER — LIDOCAINE-EPINEPHRINE (PF) 1 %-1:200000 IJ SOLN
INTRAMUSCULAR | Status: DC | PRN
  Administered 2018-07-04: 2.5 mL

## 2018-07-04 MED ORDER — DEXMEDETOMIDINE HCL IN NACL 200 MCG/50ML IV SOLN
INTRAVENOUS | Status: DC | PRN
  Administered 2018-07-04: 8 ug via INTRAVENOUS

## 2018-07-04 MED ORDER — LABETALOL HCL 5 MG/ML IV SOLN
10.0000 mg | Freq: Once | INTRAVENOUS | Status: AC
  Administered 2018-07-04: 10 mg via INTRAVENOUS

## 2018-07-04 MED ORDER — SODIUM CHLORIDE 0.9 % IV SOLN
INTRAVENOUS | Status: AC
  Filled 2018-07-04: qty 1.2

## 2018-07-04 MED ORDER — SODIUM CHLORIDE 0.9% FLUSH: 3.0000 mL | Freq: Two times a day (BID) | INTRAVENOUS | Status: DC

## 2018-07-04 MED ORDER — BICTEGRAVIR-EMTRICITAB-TENOFOV 50-200-25 MG PO TABS
1.0000 | ORAL_TABLET | Freq: Every day | ORAL | Status: DC
  Administered 2018-07-05: 1 via ORAL
  Filled 2018-07-04: qty 1

## 2018-07-04 MED ORDER — CYCLOBENZAPRINE HCL 10 MG PO TABS: 10.0000 mg | ORAL_TABLET | Freq: Every evening | ORAL | Status: DC | PRN

## 2018-07-04 MED ORDER — OXYCODONE HCL 5 MG PO TABS: 5.0000 mg | ORAL_TABLET | ORAL | 0 refills | Status: DC | PRN

## 2018-07-04 MED ORDER — HEPARIN SODIUM (PORCINE) 5000 UNIT/ML IJ SOLN
5000.0000 [IU] | Freq: Three times a day (TID) | INTRAMUSCULAR | Status: DC
  Administered 2018-07-04: 5000 [IU] via SUBCUTANEOUS
  Filled 2018-07-04: qty 1

## 2018-07-04 MED ORDER — SODIUM CHLORIDE 0.9% FLUSH: 3.0000 mL | INTRAVENOUS | Status: DC | PRN

## 2018-07-04 MED ORDER — HYDROCODONE-ACETAMINOPHEN 7.5-325 MG PO TABS
ORAL_TABLET | ORAL | Status: AC
  Filled 2018-07-04: qty 1

## 2018-07-04 MED ORDER — HYDROMORPHONE HCL 1 MG/ML IJ SOLN
0.5000 mg | INTRAMUSCULAR | Status: DC | PRN
  Administered 2018-07-04 – 2018-07-05 (×3): 0.5 mg via INTRAVENOUS
  Filled 2018-07-04 (×3): qty 1

## 2018-07-04 MED ORDER — CARVEDILOL 25 MG PO TABS
25.0000 mg | ORAL_TABLET | Freq: Two times a day (BID) | ORAL | Status: DC
  Administered 2018-07-04 – 2018-07-05 (×2): 25 mg via ORAL
  Filled 2018-07-04 (×2): qty 1

## 2018-07-04 MED ORDER — HEPARIN SODIUM (PORCINE) 1000 UNIT/ML IJ SOLN
INTRAMUSCULAR | Status: AC
  Filled 2018-07-04: qty 1

## 2018-07-04 MED ORDER — CLONIDINE HCL 0.3 MG/24HR TD PTWK: 0.3000 mg | MEDICATED_PATCH | TRANSDERMAL | Status: DC

## 2018-07-04 MED ORDER — LABETALOL HCL 5 MG/ML IV SOLN
INTRAVENOUS | Status: AC
  Administered 2018-07-04: 10 mg via INTRAVENOUS
  Filled 2018-07-04: qty 4

## 2018-07-04 MED ORDER — SODIUM CHLORIDE 0.9 % IV SOLN
INTRAVENOUS | Status: DC | PRN
  Administered 2018-07-04: 07:00:00 via INTRAVENOUS

## 2018-07-04 SURGICAL SUPPLY — 33 items
ARMBAND PINK RESTRICT EXTREMIT (MISCELLANEOUS) ×3 IMPLANT
CANISTER SUCT 3000ML PPV (MISCELLANEOUS) ×3 IMPLANT
CANNULA VESSEL 3MM 2 BLNT TIP (CANNULA) ×3 IMPLANT
CLIP VESOCCLUDE MED 6/CT (CLIP) ×3 IMPLANT
CLIP VESOCCLUDE SM WIDE 6/CT (CLIP) ×3 IMPLANT
COVER PROBE W GEL 5X96 (DRAPES) IMPLANT
COVER WAND RF STERILE (DRAPES) ×3 IMPLANT
DERMABOND ADVANCED (GAUZE/BANDAGES/DRESSINGS) ×2
DERMABOND ADVANCED .7 DNX12 (GAUZE/BANDAGES/DRESSINGS) ×1 IMPLANT
ELECT REM PT RETURN 9FT ADLT (ELECTROSURGICAL) ×3
ELECTRODE REM PT RTRN 9FT ADLT (ELECTROSURGICAL) ×1 IMPLANT
GLOVE BIO SURGEON STRL SZ 6.5 (GLOVE) ×2 IMPLANT
GLOVE BIO SURGEON STRL SZ7.5 (GLOVE) ×3 IMPLANT
GLOVE BIO SURGEONS STRL SZ 6.5 (GLOVE) ×1
GLOVE BIOGEL PI IND STRL 6.5 (GLOVE) ×3 IMPLANT
GLOVE BIOGEL PI IND STRL 8 (GLOVE) ×1 IMPLANT
GLOVE BIOGEL PI INDICATOR 6.5 (GLOVE) ×6
GLOVE BIOGEL PI INDICATOR 8 (GLOVE) ×2
GOWN STRL REUS W/ TWL LRG LVL3 (GOWN DISPOSABLE) ×3 IMPLANT
GOWN STRL REUS W/TWL LRG LVL3 (GOWN DISPOSABLE) ×6
KIT BASIN OR (CUSTOM PROCEDURE TRAY) ×3 IMPLANT
KIT TURNOVER KIT B (KITS) ×3 IMPLANT
NS IRRIG 1000ML POUR BTL (IV SOLUTION) ×3 IMPLANT
PACK CV ACCESS (CUSTOM PROCEDURE TRAY) ×3 IMPLANT
PAD ARMBOARD 7.5X6 YLW CONV (MISCELLANEOUS) ×6 IMPLANT
SPONGE SURGIFOAM ABS GEL 100 (HEMOSTASIS) IMPLANT
SUT PROLENE 6 0 BV (SUTURE) ×3 IMPLANT
SUT VIC AB 3-0 SH 27 (SUTURE) ×2
SUT VIC AB 3-0 SH 27X BRD (SUTURE) ×1 IMPLANT
SUT VICRYL 4-0 PS2 18IN ABS (SUTURE) ×3 IMPLANT
TOWEL GREEN STERILE (TOWEL DISPOSABLE) ×3 IMPLANT
UNDERPAD 30X30 (UNDERPADS AND DIAPERS) ×3 IMPLANT
WATER STERILE IRR 1000ML POUR (IV SOLUTION) ×3 IMPLANT

## 2018-07-04 NOTE — Anesthesia Postprocedure Evaluation (Signed)
Anesthesia Post Note  Patient: Jeffrey [name]  Procedure(s) Performed: REVISION OF ARTERIOVENOUS RADIOCEPHALIC FISTULA (Left Arm Lower)     Patient location during evaluation: PACU Anesthesia Type: MAC Level of consciousness: awake and alert Pain management: pain level controlled Vital Signs Assessment: post-procedure vital signs reviewed and stable Respiratory status: spontaneous breathing, nonlabored ventilation, respiratory function stable and patient connected to nasal cannula oxygen Cardiovascular status: stable and blood pressure returned to baseline Postop Assessment: no apparent nausea or vomiting Anesthetic complications: no    Last Vitals:  Vitals:   07/04/18 1719 07/04/18 1953  BP: (!) 240/85 (!) 122/94  Pulse: 73 70  Resp: 20 18  Temp: 36.8 C   SpO2: 97% 100%    Last Pain:  Vitals:   07/04/18 1951  TempSrc:   PainSc: 0-No pain                 Barnet [place]

## 2018-07-04 NOTE — Telephone Encounter (Signed)
Spoke with Dr.Lateefs nurse at [facility] in regards to recommendations about pt's dialysis for his cath. She states that she will send a message to the doctor for recommendations and reach out to the patient once she gets them.

## 2018-07-04 NOTE — Anesthesia Procedure Notes (Signed)
Procedure Name: MAC Date/Time: 07/04/2018 11:11 AM Performed by: Lance Coon, CRNA Pre-anesthesia Checklist: Patient identified, Emergency Drugs available, Suction available, Timeout performed and Patient being monitored Patient Re-evaluated:Patient Re-evaluated prior to induction Oxygen Delivery Method: Simple face mask

## 2018-07-04 NOTE — Progress Notes (Signed)
Dr. Valma Cava notified of patient's blood pressure and gave verbal order to give 10 mg Labetalol.   Will continue to monitor patient.

## 2018-07-04 NOTE — Progress Notes (Signed)
Called pharmacy to complete med rec, [name] in pharmacy states no tech available.  This RN verified medications with patient and brother, who is patient's caregiver.

## 2018-07-04 NOTE — H&P (Addendum)
[facility]                                                                                    Patient Demographics  Jeffrey [name], is a 62 y.o. male  CSN: [id number]  MRN: [id number]  DOB - July 19, 1956  Admit Date - 07/04/2018  Outpatient Primary MD for the patient is Velna Ochs, MD   With History of -  Past Medical History:  Diagnosis Date  . Anemia   . Anxiety   . Aortic valve stenosis   . Arthritis   . Asthma    per pt hx  . Dyspnea   . End stage renal disease on home HD 07/10/2011   Started HD in September 2012 at [facility] with a tunneled HD catheter, now on home HD with NxtStage. Dialyzing through AVF L lower arm with buttonhole technique as of mid 2014. His brother does the HD treatments at home.  They are roommates for 23 years.  The brother works 3rd shift and gets off about 8am and then puts Mr Hippler on HD in the morning after getting home. Most of the time he does HD about 4 times a week, for about 4 hours per treatment. Cause of ESRD was HTN according to patient. He says he let his health go and ending up with complications, and that he didn't like seeing doctors in those days.  He says he was diagnosed with severe HTN when he lived in [place] in his 54's.   . Hepatitis    Hepatitis B  . Hepatitis B carrier ([place])   . HIV infection ([place])   . Hypertension   . Hyperthyroidism    normal now  . Pneumonia several yrs ago  . Seizure ([place])   . Seizures ([place]) 06/02/2011   had a mild one in Sept. 2019  . Sleep apnea    to start Cpap soon  . Thrombocytopenia ([place])       Past Surgical History:  Procedure Laterality Date  . AV FISTULA PLACEMENT  06/02/11   Left radiocephalic AVF  . BIOPSY  02/17/2018   Procedure: BIOPSY;  Surgeon: Milus Banister, MD;  Location: [facility] ENDOSCOPY;  Service: Endoscopy;;  . COLONOSCOPY    . COLONOSCOPY WITH PROPOFOL N/A 01/27/2018   Procedure: COLONOSCOPY WITH PROPOFOL;  Surgeon: [name], MD;  Location: [facility] ENDOSCOPY;   Service: Gastroenterology;  Laterality: N/A;  . CORONARY ANGIOPLASTY    . ERCP  03/21/2018   AT [place]  . ESOPHAGOGASTRODUODENOSCOPY N/A 02/17/2018   Procedure: ESOPHAGOGASTRODUODENOSCOPY (EGD);  Surgeon: Milus Banister, MD;  Location: [facility] ENDOSCOPY;  Service: Endoscopy;  Laterality: N/A;  . ESOPHAGOGASTRODUODENOSCOPY (EGD) WITH PROPOFOL N/A 01/27/2018   Procedure: ESOPHAGOGASTRODUODENOSCOPY (EGD) WITH PROPOFOL;  Surgeon: [name], MD;  Location: [facility] ENDOSCOPY;  Service: Gastroenterology;  Laterality: N/A;  . ESOPHAGOGASTRODUODENOSCOPY (EGD) WITH PROPOFOL N/A 04/03/2018   Procedure: ESOPHAGOGASTRODUODENOSCOPY (EGD) WITH PROPOFOL;  Surgeon: Clarene Essex, MD;  Location: [place];  Service: Endoscopy;  Laterality: N/A;  . EUS N/A 02/17/2018   Procedure: UPPER ENDOSCOPIC ULTRASOUND (EUS) RADIAL;  Surgeon: Milus Banister, MD;  Location: [facility] ENDOSCOPY;  Service: Endoscopy;  Laterality: N/A;  .  fistulaogram     x 2 last 2 years  . IR DIALY SHUNT INTRO NEEDLE/INTRACATH INITIAL W/IMG LEFT Left 04/20/2018  . IR FLUORO GUIDE CV LINE RIGHT  09/16/2017  . IR US GUIDE VASC ACCESS RIGHT  09/16/2017  . IRRIGATION AND DEBRIDEMENT KNEE Left 09/15/2017   Procedure: IRRIGATION AND DEBRIDEMENT KNEE; arthroscopic clean out;  Surgeon: [name], MD;  Location: [facility] OR[facility];  Service: Orthopedics;  Laterality: Left;  . OTHER SURGICAL HISTORY     removal temporary HD catheter   . REVISON OF ARTERIOVENOUS FISTULA Left 10/10/2015   Procedure: REVISON OF LEFT RADIOCEPHALIC ARTERIOVENOUS FISTULA;  Surgeon: Angelia Mould, MD;  Location: [place];  Service: Vascular;  Laterality: Left;  . REVISON OF ARTERIOVENOUS FISTULA Left 02/07/2016   Procedure: REPAIR OF PSEUDO-ANEUREYSM OF LEFT ARM  ARTERIOVENOUS FISTULA;  Surgeon: Angelia Mould, MD;  Location: [place];  Service: Vascular;  Laterality: Left;  . REVISON OF ARTERIOVENOUS FISTULA Left 05/08/2992   Procedure: PLICATION OF LEFT ARM RADIOCEPHALIC  ARTERIOVENOUS FISTULA PSEUDOANEURYSM;  Surgeon: Angelia Mould, MD;  Location: [place];  Service: Vascular;  Laterality: Left;  . RIGHT/LEFT HEART CATH AND CORONARY ANGIOGRAPHY N/A 02/17/2017   Procedure: Right/Left Heart Cath and Coronary Angiography;  Surgeon: Sherren Mocha, MD;  Location: [place] CV LAB;  Service: Cardiovascular;  Laterality: N/A;  . TEE WITHOUT CARDIOVERSION N/A 09/11/2016   Procedure: TRANSESOPHAGEAL ECHOCARDIOGRAM (TEE);  Surgeon: Dorothy Spark, MD;  Location: [place];  Service: Cardiovascular;  Laterality: N/A;  . TEE WITHOUT CARDIOVERSION N/A 05/05/2018   Procedure: TRANSESOPHAGEAL ECHOCARDIOGRAM (TEE);  Surgeon: Adrian Prows, MD;  Location: [facility] ENDOSCOPY;  Service: Cardiovascular;  Laterality: N/A;  Prefer after 3:30 PM    in for   No chief complaint on file.    HPI  Jeffrey [name]  is a 62 y.o. male, with past medical history significant for HIV disease and end-stage renal disease who I was called to  admit for hypertensive urgency status post AV fistula repair on the left by Dr. Scot Dock from vascular surgery.  I saw the patient in PACU and he had no complaints.  Denies any chest pains or shortness of breath.  Denies any nausea vomiting or diarrhea.  Patient was started on IV hydralazine as needed, chest x-ray and EKG ordered.  And Dr. Jimmy Footman from nephrology was consulted and will see the patient if needed in a.m. since patient is on home dialysis and will have dialysis as outpatient.  Patient to be continued on his regular p.o. medications that include hydralazine, Coreg and clonidine for now as well    Review of Systems    In addition to the HPI above,  No Fever-chills, No Headache, No changes with Vision or hearing, No problems swallowing food or Liquids, No Chest pain, Cough or Shortness of Breath, No Abdominal pain, No Nausea or Vommitting, Bowel movements are regular, No Blood in stool or Urine, No dysuria, No new skin rashes or  bruises, No new joints pains-aches,  No new weakness, tingling, numbness in any extremity, No recent weight gain or loss, No polyuria, polydypsia or polyphagia, No significant Mental Stressors.  A full 10 point Review of Systems was done, except as stated above, all other Review of Systems were negative.   Social History Social History   Tobacco Use  . Smoking status: Former Smoker    Years: 10.00    Types: Cigarettes    Last attempt to quit: 08/08/2015    Years since quitting: 2.9  .  Smokeless tobacco: Never Used  . Tobacco comment: casual smoking for 10 years  Substance Use Topics  . Alcohol use: No    Alcohol/week: 0.0 standard drinks    Comment: quit 2004     Family History Family History  Problem Relation Age of Onset  . Hypertension Mother   . Diabetes Mother   . Cancer Father      Prior to Admission medications   Medication Sig Start Date End Date Taking? Authorizing Provider  albuterol (PROVENTIL) (2.5 MG/3ML) 0.083% nebulizer solution Take 2.5 mg by nebulization every 6 (six) hours as needed for wheezing or shortness of breath.   Yes [provider]  ALPRAZolam (XANAX) 0.25 MG tablet Take 0.25 mg by mouth at bedtime as needed for anxiety.    Yes [provider]  bictegravir-emtricitabine-tenofovir AF (BIKTARVY) 50-200-25 MG TABS tablet Take 1 tablet by mouth daily. 12/27/17  Yes Campbell Riches, MD  calcium acetate (PHOSLO) 667 MG capsule Take 1,334-2,001 mg by mouth See admin instructions. Take 2,001 mg three times a day with each meal and 1,334 mg with each snack 06/06/15  Yes [provider]  carvedilol (COREG) 25 MG tablet Take 1 tablet (25 mg total) by mouth 2 (two) times daily with a meal. 06/20/18  Yes Velna Ochs, MD  cloNIDine (CATAPRES - DOSED IN MG/24 HR) 0.3 mg/24hr patch Place 1 patch (0.3 mg total) onto the skin once a week. 06/20/18  Yes Velna Ochs, MD  cyclobenzaprine (FLEXERIL) 10 MG tablet Take 1 tablet (10  mg total) by mouth at bedtime as needed for muscle spasms. 06/14/18  Yes Candice Camp, MD  epoetin alfa (EPOGEN,PROCRIT) 2000 UNIT/ML injection Inject 2,000 Units into the vein 3 (three) times a week.    Yes [provider]  ethyl chloride spray Apply 1 application topically daily as needed (HD- to numb).  05/20/15  Yes [provider]  heparin 1000 UNIT/ML injection Inject 3,000 Units into the vein 4 (four) times a week.    Yes [provider]  hydrALAZINE (APRESOLINE) 100 MG tablet TAKE 1 TABLET(100 MG) BY MOUTH THREE TIMES DAILY Patient taking differently: Take 100 mg by mouth 3 (three) times daily.  06/13/18  Yes Aldine Contes, MD  hydrOXYzine (ATARAX/VISTARIL) 25 MG tablet Take 1 tablet (25 mg total) by mouth 4 (four) times daily as needed for itching. 06/20/18  Yes Velna Ochs, MD  Ipratropium-Albuterol (COMBIVENT IN) Inhale 2 puffs into the lungs every 6 (six) hours as needed (as needed for shortness of breath).    Yes [provider]  Lacosamide (VIMPAT) 100 MG TABS Take 1 tablet (100 mg total) by mouth 2 (two) times daily. 06/06/18  Yes Marcial Pacas, MD  multivitamin (RENA-VIT) TABS tablet Take 1 tablet by mouth daily.     Yes [provider]  omeprazole (PRILOSEC) 20 MG capsule Take 1 capsule (20 mg total) by mouth daily. Patient taking differently: Take 20 mg by mouth 2 (two) times daily.  01/31/18  Yes [name], MD  traZODone (DESYREL) 50 MG tablet TAKE 1 TABLET(50 MG) BY MOUTH AT BEDTIME AS NEEDED FOR SLEEP Patient taking differently: Take 50 mg by mouth at bedtime as needed for sleep.  06/24/18  Yes Campbell Riches, MD  VOLTAREN 1 % GEL APPLY 2 GRAMS EXTERNALLY TO THE AFFECTED AREA FOUR TIMES DAILY Patient taking differently: Apply 2 g topically 4 (four) times daily as needed (pain).  05/09/18  Yes Jean Rosenthal, MD  lacosamide (VIMPAT) 50  MG TABS tablet Take one half pill (25 mg) after hemodialysis Patient not taking: Reported on  07/04/2018 05/09/18   Bartholomew Crews, MD  oxyCODONE (ROXICODONE) 5 MG immediate release tablet Take 1 tablet (5 mg total) by mouth every 4 (four) hours as needed. 07/04/18   Angelia Mould, MD    Allergies  Allergen Reactions  . Lisinopril Anaphylaxis and Shortness Of Breath    Throat swelling  . Penicillins Anaphylaxis and Other (See Comments)    Childhood allergy Has patient had a PCN reaction causing immediate rash, facial/tongue/throat swelling, SOB or lightheadedness with hypotension: Yes Has patient had a PCN reaction causing severe rash involving mucus membranes or skin necrosis: Unk Has patient had a PCN reaction that required hospitalization: Unk Has patient had a PCN reaction occurring within the last 10 years: No If all of the above answers are "NO", then may proceed with Cephalosporin use.   . Fentanyl Other (See Comments)    Lethargy, AMS  . Morphine And Related Other (See Comments)    "Not Himself"    Physical Exam  Vitals  Blood pressure (!) 242/84, pulse 64, temperature (!) 97.5 F (36.4 C), resp. rate 20, height 6' (1.829 m), weight 74.4 kg, SpO2 98 %.   1. General chronically ill, in no acute distress  2. Normal affect and insight, Not Suicidal or Homicidal, Awake Alert, Oriented X 3.  3. No F.N deficits, grossly, patient moving all extremities.  4. Ears and Eyes appear Normal, muddy sclera.  5. Supple Neck, No JVD, No cervical lymphadenopathy appriciated, No Carotid Bruits.  6. Symmetrical Chest wall movement, Good air movement bilaterally, CTAB.  7. RRR, systolic murmur, No Parasternal Heave.  8. Positive Bowel Sounds, Abdomen Soft, Non tender, No organomegaly appriciated,No rebound -guarding or rigidity.  9.  No Cyanosis, Normal Skin Turgor, left arm fistula noted with thrill, no bleeding.  10. Good muscle tone,  joints appear normal , no effusions, Normal ROM.    Data Review  CBC Recent Labs  Lab 07/04/18 0630  HGB 11.2*   HCT 33.0*   ------------------------------------------------------------------------------------------------------------------  Chemistries  Recent Labs  Lab 07/04/18 0630  NA 137  K 3.5  GLUCOSE 96   ------------------------------------------------------------------------------------------------------------------ CrCl cannot be calculated (Patient's most recent lab result is older than the maximum 21 days allowed.). ------------------------------------------------------------------------------------------------------------------ No results for input(s): TSH, T4TOTAL, T3FREE, THYROIDAB in the last 72 hours.  Invalid input(s): FREET3   Coagulation profile No results for input(s): INR, PROTIME in the last 168 hours. ------------------------------------------------------------------------------------------------------------------- No results for input(s): DDIMER in the last 72 hours. -------------------------------------------------------------------------------------------------------------------  Cardiac Enzymes No results for input(s): CKMB, TROPONINI, MYOGLOBIN in the last 168 hours.  Invalid input(s): CK ------------------------------------------------------------------------------------------------------------------ Invalid input(s): POCBNP   ---------------------------------------------------------------------------------------------------------------  Urinalysis    Component Value Date/Time   COLORURINE AMBER (A) 06/14/2011 1513   APPEARANCEUR CLOUDY (A) 06/14/2011 1513   LABSPEC 1.020 06/14/2011 1513   PHURINE 6.0 06/14/2011 1513   GLUCOSEU NEGATIVE 06/14/2011 1513   HGBUR LARGE (A) 06/14/2011 1513   BILIRUBINUR SMALL (A) 06/14/2011 1513   KETONESUR NEGATIVE 06/14/2011 1513   PROTEINUR >300 (A) 06/14/2011 1513   UROBILINOGEN 0.2 06/14/2011 1513   NITRITE NEGATIVE 06/14/2011 1513   LEUKOCYTESUR SMALL (A) 06/14/2011 1513     ----------------------------------------------------------------------------------------------------------------  Imaging results:     Assessment & Plan  Hypertensive urgency , blood pressure was 231/84 with a pulse of 62 and the recovery room    Restart home medications    IV hydralazine as needed  HIV on biklarvy  Hepatitis B  Hyper thyroidism, resolved  History of seizures on Vimpat, stable  History of thrombocytopenia; monitor platelets in a.m.  End-stage renal disease on hemodialysis    Patient on list of kidney transplant    Discussed with Dr. Jimmy Footman and Dr. Holley Raring for outpatient dialysis after discharge  History of asthma, continue with duo nebs as needed  Aortic valve stenosis ; for surgery next month  History of coronary artery disease status post non-STEMI on beta-blocker.  No aspirin due to bleeding and no statins due to hepatitis, stable   DVT Prophylaxis heparin  AM Labs Ordered, also please review Full Orders  Code Status full  Disposition Plan: Home  Time spent in minutes : 38 minutes  Condition GUARDED   @SIGNATURE @

## 2018-07-04 NOTE — Transfer of Care (Signed)
Immediate Anesthesia Transfer of Care Note  Patient: Jeffrey [name]  Procedure(s) Performed: REVISION OF ARTERIOVENOUS RADIOCEPHALIC FISTULA (Left Arm Lower)  Patient Location: PACU  Anesthesia Type:MAC  Level of Consciousness: awake and patient cooperative  Airway & Oxygen Therapy: Patient Spontanous Breathing  Post-op Assessment: Report given to RN and Post -op Vital signs reviewed and stable  Post vital signs: Reviewed and stable  Last Vitals:  Vitals Value Taken Time  BP 238/85 07/04/2018 12:00 PM  Temp    Pulse 59 07/04/2018 12:00 PM  Resp 27 07/04/2018 12:01 PM  SpO2 95 % 07/04/2018 12:00 PM  Vitals shown include unvalidated device data.  Last Pain:  Vitals:   07/04/18 0624  TempSrc:   PainSc: 0-No pain      Patients Stated Pain Goal: 3 (00/93/81 8299)  Complications: No apparent anesthesia complications

## 2018-07-04 NOTE — Op Note (Signed)
    NAME: Jeffrey [name]    MRN: [id number] DOB: September 06, 1956    DATE OF OPERATION: 07/04/2018  PREOP DIAGNOSIS:    Bleeding from left radiocephalic AV fistula  POSTOP DIAGNOSIS:    Same  PROCEDURE:    Repair of pseudoaneurysm of left radiocephalic AV fistula  SURGEON: Judeth Cornfield. Scot Dock, MD, FACS  ASSIST: Dyanne Carrel, RNFA  ANESTHESIA: Local with sedation  EBL: Minimal  INDICATIONS:    Jeffrey [name] is a 62 y.o. male who is had bleeding from a specific site on his AV fistula and presents for repair of this site.  FINDINGS:   The area of recurrent bleeding was identified there was a small pseudoaneurysm here.  This was repaired with a two 6-0 Prolene sutures.  There was an excellent thrill at the completion of the procedure.  TECHNIQUE:   The patient was taken to the operating room and sedated by anesthesia.  The left arm was prepped and draped in usual sterile fashion.  After the skin was anesthetized with 1% lidocaine, an elliptical incision was made encompassing the area of concern.  The fistula was dissected free proximally and distally to the pseudoaneurysm that was identified.  The patient was heparinized.  The fistula was compressed proximal and distal to the site and the pseudoaneurysm was excised.  The vein was then repaired with two 6-0 Prolene sutures.  I then undermined the tissue to allow adequate closure over this site.  One deep layer of 3-0 Vicryl was placed.  The skin was closed with 4-0 Vicryl.  Dermabond was applied.  The patient tolerated the procedure well and was transferred to the recovery room in stable condition.  All needle and sponge counts were correct.  [name], MD, FACS [facility]  DATE OF DICTATION:   07/04/2018

## 2018-07-04 NOTE — Progress Notes (Signed)
Dr. Valma Cava made aware of BP, 10mg  Hydralazine given IV

## 2018-07-04 NOTE — Progress Notes (Signed)
Dr. Valma Cava notified of patient's blood pressure and heart rate.  Received verbal order to give 10 mg Hydralazine and 10 mg labetalol.  Will continue to monitor patient.

## 2018-07-04 NOTE — Progress Notes (Signed)
BP 225/87, pulse 67. Dr. Valma Cava made aware.

## 2018-07-04 NOTE — Interval H&P Note (Signed)
History and Physical Interval Note:  07/04/2018 7:41 AM  Jeffrey [name]  has presented today for surgery, with the diagnosis of complication of fistula  The various methods of treatment have been discussed with the patient and family. After consideration of risks, benefits and other options for treatment, the patient has consented to  Procedure(s): REVISION OF ARTERIOVENOUS RADIOCEPHALIC FISTULA (Left) as a surgical intervention .  The patient's history has been reviewed, patient examined, no change in status, stable for surgery.  I have reviewed the patient's chart and labs.  Questions were answered to the patient's satisfaction.     [name]

## 2018-07-05 ENCOUNTER — Encounter (HOSPITAL_COMMUNITY): Payer: Self-pay | Admitting: Vascular Surgery

## 2018-07-05 ENCOUNTER — Other Ambulatory Visit: Payer: Self-pay

## 2018-07-05 ENCOUNTER — Telehealth: Payer: Self-pay

## 2018-07-05 DIAGNOSIS — I120 Hypertensive chronic kidney disease with stage 5 chronic kidney disease or end stage renal disease: Secondary | ICD-10-CM | POA: Diagnosis not present

## 2018-07-05 DIAGNOSIS — T82838A Hemorrhage of vascular prosthetic devices, implants and grafts, initial encounter: Secondary | ICD-10-CM | POA: Diagnosis not present

## 2018-07-05 DIAGNOSIS — Z992 Dependence on renal dialysis: Secondary | ICD-10-CM | POA: Diagnosis not present

## 2018-07-05 DIAGNOSIS — I160 Hypertensive urgency: Secondary | ICD-10-CM | POA: Diagnosis not present

## 2018-07-05 DIAGNOSIS — N186 End stage renal disease: Secondary | ICD-10-CM | POA: Diagnosis not present

## 2018-07-05 DIAGNOSIS — B20 Human immunodeficiency virus [HIV] disease: Secondary | ICD-10-CM | POA: Diagnosis not present

## 2018-07-05 LAB — CBC
HCT: 34.5 % — ABNORMAL LOW (ref 39.0–52.0)
HEMOGLOBIN: 10.3 g/dL — AB (ref 13.0–17.0)
MCH: 27.9 pg (ref 26.0–34.0)
MCHC: 29.9 g/dL — AB (ref 30.0–36.0)
MCV: 93.5 fL (ref 80.0–100.0)
PLATELETS: 163 10*3/uL (ref 150–400)
RBC: 3.69 MIL/uL — AB (ref 4.22–5.81)
RDW: 17.5 % — ABNORMAL HIGH (ref 11.5–15.5)
WBC: 9 10*3/uL (ref 4.0–10.5)
nRBC: 0 % (ref 0.0–0.2)

## 2018-07-05 LAB — BASIC METABOLIC PANEL
Anion gap: 17 — ABNORMAL HIGH (ref 5–15)
BUN: 75 mg/dL — AB (ref 8–23)
CHLORIDE: 98 mmol/L (ref 98–111)
CO2: 21 mmol/L — ABNORMAL LOW (ref 22–32)
CREATININE: 14.66 mg/dL — AB (ref 0.61–1.24)
Calcium: 8.9 mg/dL (ref 8.9–10.3)
GFR calc Af Amer: 3 mL/min — ABNORMAL LOW (ref >60)
GFR calc non Af Amer: 3 mL/min — ABNORMAL LOW (ref >60)
GLUCOSE: 94 mg/dL (ref 70–99)
Potassium: 3.9 mmol/L (ref 3.5–5.1)
SODIUM: 136 mmol/L (ref 135–145)

## 2018-07-05 MED ORDER — AMLODIPINE BESYLATE 5 MG PO TABS: 5.0000 mg | ORAL_TABLET | Freq: Every day | ORAL | 11 refills | Status: DC

## 2018-07-05 NOTE — Care Management Note (Signed)
Case Management Note  Patient Details  Name: Jeffrey [name] MRN: [id number] Date of Birth: February 19, 1956  Subjective/Objective:               Observation for HTN after fistula revision. ESRD, 042, recent endocarditis, completed IV Abx. Home HD, lives w brother. Active [facility].      Action/Plan:  Placed consult order for [facility] to notify that patient is in hospital, per notes he is difficulty to reach in outpatient setting.  Anticipate return to home with brother at DC.  PCP C [facility] Coverage Medicare Expected Discharge Date:  07/04/18               Expected Discharge Plan:     In-House Referral:     Discharge planning Services  CM Consult  Post Acute Care Choice:    Choice offered to:     DME Arranged:    DME Agency:     HH Arranged:    HH Agency:     Status of Service:  In process, will continue to follow  If discussed at Long Length of Stay Meetings, dates discussed:    Additional Comments:  Carles Collet, RN 07/05/2018, 11:07 AM

## 2018-07-05 NOTE — Consult Note (Signed)
Patient was previously active with [facility] RNCM but had difficulty maintaining contact and referral received from inpatient for post hospital follow up.  Patient had already transitioned home.  Consent on file and brother is the contact person noted.  Natividad Brood, RN BSN [facility] Liaison  [phone] business mobile phone Toll free office [phone]

## 2018-07-05 NOTE — Progress Notes (Signed)
Patient discharged to go home with his brother  B/P is still elevated and rest of vitals are WNL.  Two scripts given to the patient to fill himself.

## 2018-07-05 NOTE — Telephone Encounter (Signed)
Spoke with pt's brother [name] per [employer]. And notified him that pt does not need to come in tomorrow for lab work since pt was in the hospital yesterday and labs where drawn. Pt's brother verbalized understanding and was very appreciative for the call.

## 2018-07-05 NOTE — Discharge Summary (Signed)
Physician Discharge Summary  [name] ZYS:[id number] DOB: 18-Feb-1956 DOA: 07/04/2018  PCP: Velna Ochs, MD  Admit date: 07/04/2018 Discharge date: 07/05/2018  Admitted From: Home  Disposition: Home   Recommendations for Outpatient Follow-up:  1. Follow up with PCP in 1-2 weeks 2. Please obtain BMP/CBC in one week 3. He will get HD at home.    Home Health: none  Discharge Condition: stable.  CODE STATUS: full code.  Diet recommendation: Heart Healthy   Brief/Interim Summary:  Jeffrey [name]  is a 62 y.o. male, with past medical history significant for HIV disease and end-stage renal disease who I was called to  admit for hypertensive urgency status post AV fistula repair on the left by Dr. Scot Dock from vascular surgery.  I saw the patient in PACU and he had no complaints.  Denies any chest pains or shortness of breath.  Denies any nausea vomiting or diarrhea.  Patient was started on IV hydralazine as needed, chest x-ray and EKG ordered.  And Dr. Jimmy Footman from nephrology was consulted and will see the patient if needed in a.m. since patient is on home dialysis and will have dialysis as outpatient.  Patient to be continued on his regular p.o. medications that include hydralazine, Coreg and clonidine for now as well  1-HTN urgency; SBP 231 on admission. He received IV hydralazine, labetalol.  His BP medications were resume,. SBP improved , decreased to 160. He will be discharge on low dose Norvasc.   2-ESRD on hemodialysis.  Chest x ray with chronic effusion.  Patient denies chest pain, or dyspnea.  He will get HD at home today after discharge.   3-history of seizure; resume vimpat.   4-HIV; resume home medications.   5-S/P aneurysmal repair. Per vascular.     Discharge Diagnoses:  Active Problems:   Hypertensive urgency    Discharge Instructions  Discharge Instructions    Call MD for:  redness, tenderness, or signs of infection (pain, swelling, bleeding,  redness, odor or green/yellow discharge around incision site)   Complete by:  As directed    Call MD for:  severe or increased pain, loss or decreased feeling  in affected limb(s)   Complete by:  As directed    Call MD for:  temperature >100.5   Complete by:  As directed    Diet - low sodium heart healthy   Complete by:  As directed    Discharge instructions   Complete by:  As directed    ACTIVITY: Rest today. You may resume her normal activity tomorrow.  DIET: Resume your previous diet.  WOUND CARE: If you have a dressing, this can be removed in 48 hours. Otherwise, keep your incisions clean and dry. Elevate the affected limb on a pillow. You may shower starting 48 hours after your surgery.  SPECIAL INSTRUCTIONS: You will have mild to moderate discomfort at the incision and graft site. Call your doctor for: persistent or heavy bleeding at the surgical site, increasing redness or swelling of the incision, a temperature greater than 101, severe pain or loss of feeling in the hand or foot on the side of surgery.  FOLLOW-UP: Call the office if there are any problems.  [facility]  OFFICE NUMBER: [phone]   Increase activity slowly   Complete by:  As directed    Lifting restrictions   Complete by:  As directed    No lifting for 2 weeks   Resume previous diet   Complete by:  As directed  Allergies as of 07/05/2018      Reactions   Lisinopril Anaphylaxis, Shortness Of Breath   Throat swelling   Penicillins Anaphylaxis, Other (See Comments)   Childhood allergy Has patient had a PCN reaction causing immediate rash, facial/tongue/throat swelling, SOB or lightheadedness with hypotension: Yes Has patient had a PCN reaction causing severe rash involving mucus membranes or skin necrosis: Unk Has patient had a PCN reaction that required hospitalization: Unk Has patient had a PCN reaction occurring within the last 10 years: No If all of the above answers are "NO",  then may proceed with Cephalosporin use.   Fentanyl Other (See Comments)   Lethargy, AMS   Morphine And Related Other (See Comments)   "Not Himself"      Medication List    TAKE these medications   albuterol (2.5 MG/3ML) 0.083% nebulizer solution Commonly known as:  PROVENTIL Take 2.5 mg by nebulization every 6 (six) hours as needed for wheezing or shortness of breath.   ALPRAZolam 0.25 MG tablet Commonly known as:  XANAX Take 0.25 mg by mouth at bedtime as needed for anxiety.   amLODipine 5 MG tablet Commonly known as:  NORVASC Take 1 tablet (5 mg total) by mouth daily.   bictegravir-emtricitabine-tenofovir AF 50-200-25 MG Tabs tablet Commonly known as:  BIKTARVY Take 1 tablet by mouth daily.   calcium acetate 667 MG capsule Commonly known as:  PHOSLO Take 1,334-2,001 mg by mouth See admin instructions. Take 2,001 mg three times a day with each meal and 1,334 mg with each snack   carvedilol 25 MG tablet Commonly known as:  COREG Take 1 tablet (25 mg total) by mouth 2 (two) times daily with a meal.   cloNIDine 0.3 mg/24hr patch Commonly known as:  CATAPRES - Dosed in mg/24 hr Place 1 patch (0.3 mg total) onto the skin once a week.   COMBIVENT IN Inhale 2 puffs into the lungs every 6 (six) hours as needed (as needed for shortness of breath).   cyclobenzaprine 10 MG tablet Commonly known as:  FLEXERIL Take 1 tablet (10 mg total) by mouth at bedtime as needed for muscle spasms.   epoetin alfa 2000 UNIT/ML injection Commonly known as:  EPOGEN,PROCRIT Inject 2,000 Units into the vein 3 (three) times a week.   ethyl chloride spray Apply 1 application topically daily as needed (HD- to numb).   heparin 1000 UNIT/ML injection Inject 3,000 Units into the vein 4 (four) times a week.   hydrALAZINE 100 MG tablet Commonly known as:  APRESOLINE TAKE 1 TABLET(100 MG) BY MOUTH THREE TIMES DAILY What changed:  See the new instructions.   hydrOXYzine 25 MG tablet Commonly  known as:  ATARAX/VISTARIL Take 1 tablet (25 mg total) by mouth 4 (four) times daily as needed for itching.   Lacosamide 100 MG Tabs Take 1 tablet (100 mg total) by mouth 2 (two) times daily. What changed:  Another medication with the same name was removed. Continue taking this medication, and follow the directions you see here.   multivitamin Tabs tablet Take 1 tablet by mouth daily.   omeprazole 20 MG capsule Commonly known as:  PRILOSEC Take 1 capsule (20 mg total) by mouth daily. What changed:  when to take this   oxyCODONE 5 MG immediate release tablet Commonly known as:  Oxy IR/ROXICODONE Take 1 tablet (5 mg total) by mouth every 4 (four) hours as needed.   traZODone 50 MG tablet Commonly known as:  DESYREL TAKE 1 TABLET(50 MG) BY MOUTH  AT BEDTIME AS NEEDED FOR SLEEP What changed:  See the new instructions.   VOLTAREN 1 % Gel Generic drug:  diclofenac sodium APPLY 2 GRAMS EXTERNALLY TO THE AFFECTED AREA FOUR TIMES DAILY What changed:    how much to take  how to take this  when to take this  reasons to take this  additional instructions       Allergies  Allergen Reactions  . Lisinopril Anaphylaxis and Shortness Of Breath    Throat swelling  . Penicillins Anaphylaxis and Other (See Comments)    Childhood allergy Has patient had a PCN reaction causing immediate rash, facial/tongue/throat swelling, SOB or lightheadedness with hypotension: Yes Has patient had a PCN reaction causing severe rash involving mucus membranes or skin necrosis: Unk Has patient had a PCN reaction that required hospitalization: Unk Has patient had a PCN reaction occurring within the last 10 years: No If all of the above answers are "NO", then may proceed with Cephalosporin use.   . Fentanyl Other (See Comments)    Lethargy, AMS  . Morphine And Related Other (See Comments)    "Not Himself"    Consultations:  Vascular.    Procedures/Studies: Dg Chest Port 1 View  Result Date:  07/04/2018 CLINICAL DATA:  Postoperative follow-up of fistula repair. EXAM: PORTABLE CHEST 1 VIEW COMPARISON:  06/04/2018 FINDINGS: Chronic cardiomegaly and aortic tortuosity. Worsened appearance of the lungs with increased pleural fluid on the left and worsened volume loss/infiltrate at the left lower lobe and worsened patchy density or atelectasis at the right lung base. Upper lungs remain clear. IMPRESSION: Worsening compared to the study of 1 month ago. Left effusion with worsened atelectasis/infiltrate in the left lower lobe. Slight worsened patchy density at the right lung base. Electronically Signed   By: Nelson Chimes M.D.   On: 07/04/2018 17:20      Subjective: He denies chest pain or dyspnea.   Discharge Exam: Vitals:   07/05/18 0801 07/05/18 1100  BP: (!) 198/76 (!) 166/71  Pulse:    Resp:    Temp:    SpO2:     Vitals:   07/05/18 0403 07/05/18 0739 07/05/18 0801 07/05/18 1100  BP: (!) 194/66 (!) 198/76 (!) 198/76 (!) 166/71  Pulse: 64 (!) 57    Resp: 18 18    Temp: 98.4 F (36.9 C) 98.3 F (36.8 C)    TempSrc: Oral Oral    SpO2: 96% 98%    Weight:      Height:        General: Pt is alert, awake, not in acute distress Cardiovascular: RRR, S1/S2 +, no rubs, no gallops Respiratory: CTA bilaterally, no wheezing, no rhonchi Abdominal: Soft, NT, ND, bowel sounds + Extremities: no edema, no cyanosis    The results of significant diagnostics from this hospitalization (including imaging, microbiology, ancillary and laboratory) are listed below for reference.     Microbiology: Recent Results (from the past 240 hour(s))  MRSA PCR Screening     Status: None   Collection Time: 07/04/18  5:18 PM  Result Value Ref Range Status   MRSA by PCR NEGATIVE NEGATIVE Final    Comment:        The GeneXpert MRSA Assay (FDA approved for NASAL specimens only), is one component of a comprehensive MRSA colonization surveillance program. It is not intended to diagnose  MRSA infection nor to guide or monitor treatment for MRSA infections. Performed at [facility], [place] [address], [place], [place] [address]  Labs: BNP (last 3 results) Recent Labs    04/17/18 1035 05/01/18 2117 05/27/18 2012  BNP 1,944.4* 909.6* 2,671.2*   Basic Metabolic Panel: Recent Labs  Lab 07/04/18 0630 07/05/18 0356  NA 137 136  K 3.5 3.9  CL  --  98  CO2  --  21*  GLUCOSE 96 94  BUN  --  75*  CREATININE  --  14.66*  CALCIUM  --  8.9   Liver Function Tests: No results for input(s): AST, ALT, ALKPHOS, BILITOT, PROT, ALBUMIN in the last 168 hours. No results for input(s): LIPASE, AMYLASE in the last 168 hours. No results for input(s): AMMONIA in the last 168 hours. CBC: Recent Labs  Lab 07/04/18 0630 07/05/18 0356  WBC  --  9.0  HGB 11.2* 10.3*  HCT 33.0* 34.5*  MCV  --  93.5  PLT  --  163   Cardiac Enzymes: No results for input(s): CKTOTAL, CKMB, CKMBINDEX, TROPONINI in the last 168 hours. BNP: Invalid input(s): POCBNP CBG: No results for input(s): GLUCAP in the last 168 hours. D-Dimer No results for input(s): DDIMER in the last 72 hours. Hgb A1c No results for input(s): HGBA1C in the last 72 hours. Lipid Profile No results for input(s): CHOL, HDL, LDLCALC, TRIG, CHOLHDL, LDLDIRECT in the last 72 hours. Thyroid function studies No results for input(s): TSH, T4TOTAL, T3FREE, THYROIDAB in the last 72 hours.  Invalid input(s): FREET3 Anemia work up No results for input(s): VITAMINB12, FOLATE, FERRITIN, TIBC, IRON, RETICCTPCT in the last 72 hours. Urinalysis    Component Value Date/Time   COLORURINE AMBER (A) 06/14/2011 1513   APPEARANCEUR CLOUDY (A) 06/14/2011 1513   LABSPEC 1.020 06/14/2011 1513   PHURINE 6.0 06/14/2011 1513   GLUCOSEU NEGATIVE 06/14/2011 1513   HGBUR LARGE (A) 06/14/2011 1513   BILIRUBINUR SMALL (A) 06/14/2011 1513   KETONESUR NEGATIVE 06/14/2011 1513   PROTEINUR >300 (A) 06/14/2011 1513   UROBILINOGEN 0.2  06/14/2011 1513   NITRITE NEGATIVE 06/14/2011 1513   LEUKOCYTESUR SMALL (A) 06/14/2011 1513   Sepsis Labs Invalid input(s): PROCALCITONIN,  WBC,  LACTICIDVEN Microbiology Recent Results (from the past 240 hour(s))  MRSA PCR Screening     Status: None   Collection Time: 07/04/18  5:18 PM  Result Value Ref Range Status   MRSA by PCR NEGATIVE NEGATIVE Final    Comment:        The GeneXpert MRSA Assay (FDA approved for NASAL specimens only), is one component of a comprehensive MRSA colonization surveillance program. It is not intended to diagnose MRSA infection nor to guide or monitor treatment for MRSA infections. Performed at [facility], [place] [address] [place], [place] [address]      Time coordinating discharge: 35 minutes.   SIGNED:   Elmarie Shiley, MD  [facility] 07/05/2018, 11:52 AM Pager   If 7PM-7AM, please contact night-coverage [email or web address] Password [email or web address]

## 2018-07-05 NOTE — Plan of Care (Signed)
Problem: Education: Goal: Knowledge of General Education information will improve Description Including pain rating scale, medication(s)/side effects and non-pharmacologic comfort measures 07/05/2018 1259 by Lurline Idol, RN Outcome: Adequate for Discharge 07/05/2018 1022 by Lurline Idol, RN Outcome: Progressing   Problem: Health Behavior/Discharge Planning: Goal: Ability to manage health-related needs will improve 07/05/2018 1259 by Lurline Idol, RN Outcome: Adequate for Discharge 07/05/2018 1022 by Lurline Idol, RN Outcome: Progressing   Problem: Clinical Measurements: Goal: Ability to maintain clinical measurements within normal limits will improve 07/05/2018 1259 by Lurline Idol, RN Outcome: Adequate for Discharge 07/05/2018 1022 by Lurline Idol, RN Outcome: Progressing Goal: Will remain free from infection 07/05/2018 1259 by Lurline Idol, RN Outcome: Adequate for Discharge 07/05/2018 1022 by Lurline Idol, RN Outcome: Progressing Goal: Diagnostic test results will improve 07/05/2018 1259 by Lurline Idol, RN Outcome: Adequate for Discharge 07/05/2018 1022 by Lurline Idol, RN Outcome: Progressing Goal: Respiratory complications will improve 07/05/2018 1259 by Lurline Idol, RN Outcome: Adequate for Discharge 07/05/2018 1022 by Lurline Idol, RN Outcome: Progressing Goal: Cardiovascular complication will be avoided 07/05/2018 1259 by Lurline Idol, RN Outcome: Adequate for Discharge 07/05/2018 1022 by Lurline Idol, RN Outcome: Progressing   Problem: Activity: Goal: Risk for activity intolerance will decrease 07/05/2018 1259 by Lurline Idol, RN Outcome: Adequate for Discharge 07/05/2018 1022 by Lurline Idol, RN Outcome: Progressing   Problem: Nutrition: Goal: Adequate nutrition will be maintained 07/05/2018 1259 by Lurline Idol, RN Outcome: Adequate for Discharge 07/05/2018 1022 by Lurline Idol, RN Outcome: Progressing    Problem: Coping: Goal: Level of anxiety will decrease 07/05/2018 1259 by Lurline Idol, RN Outcome: Adequate for Discharge 07/05/2018 1022 by Lurline Idol, RN Outcome: Progressing   Problem: Elimination: Goal: Will not experience complications related to bowel motility 07/05/2018 1259 by Lurline Idol, RN Outcome: Adequate for Discharge 07/05/2018 1022 by Lurline Idol, RN Outcome: Progressing Goal: Will not experience complications related to urinary retention 07/05/2018 1259 by Lurline Idol, RN Outcome: Adequate for Discharge 07/05/2018 1022 by Lurline Idol, RN Outcome: Progressing   Problem: Pain Managment: Goal: General experience of comfort will improve 07/05/2018 1259 by Lurline Idol, RN Outcome: Adequate for Discharge 07/05/2018 1022 by Lurline Idol, RN Outcome: Progressing   Problem: Safety: Goal: Ability to remain free from injury will improve 07/05/2018 1259 by Lurline Idol, RN Outcome: Adequate for Discharge 07/05/2018 1022 by Lurline Idol, RN Outcome: Progressing   Problem: Skin Integrity: Goal: Risk for impaired skin integrity will decrease 07/05/2018 1259 by Lurline Idol, RN Outcome: Adequate for Discharge 07/05/2018 1022 by Lurline Idol, RN Outcome: Progressing   Problem: Education: Goal: Knowledge of disease and its progression will improve 07/05/2018 1259 by Lurline Idol, RN Outcome: Adequate for Discharge 07/05/2018 1022 by Lurline Idol, RN Outcome: Progressing Goal: Individualized Educational Video(s) 07/05/2018 1259 by Lurline Idol, RN Outcome: Adequate for Discharge 07/05/2018 1022 by Lurline Idol, RN Outcome: Progressing   Problem: Fluid Volume: Goal: Compliance with measures to maintain balanced fluid volume will improve 07/05/2018 1259 by Lurline Idol, RN Outcome: Adequate for Discharge 07/05/2018 1022 by Lurline Idol, RN Outcome: Progressing   Problem: Health Behavior/Discharge Planning: Goal:  Ability to manage health-related needs will improve 07/05/2018 1259 by Lurline Idol, RN Outcome: Adequate for Discharge 07/05/2018 1022 by Lurline Idol, RN Outcome: Progressing   Problem: Nutritional: Goal: Ability  to make healthy dietary choices will improve 07/05/2018 1259 by Lurline Idol, RN Outcome: Adequate for Discharge 07/05/2018 1022 by Lurline Idol, RN Outcome: Progressing   Problem: Clinical Measurements: Goal: Complications related to the disease process, condition or treatment will be avoided or minimized 07/05/2018 1259 by Lurline Idol, RN Outcome: Adequate for Discharge 07/05/2018 1022 by Lurline Idol, RN Outcome: Progressing

## 2018-07-05 NOTE — Plan of Care (Signed)

## 2018-07-06 ENCOUNTER — Other Ambulatory Visit: Payer: Self-pay

## 2018-07-06 ENCOUNTER — Ambulatory Visit: Payer: Medicare Other | Admitting: Infectious Diseases

## 2018-07-06 ENCOUNTER — Other Ambulatory Visit: Payer: Medicare Other

## 2018-07-06 NOTE — Patient Outreach (Signed)
[facility]) Care Management  07/06/2018  Jeffrey [name] September 11, 1956 [id number]  Referral received 07/05/18. Client discharged from hospital post observation stay (10/14-10/15). Client had Arterio-venous fistula repair on 10/14 and was hospitalized observation due to hypertensive urgency. Primary care office listed as completing transition of care.  RNCM called to follow up. RNCM called all numbers listed. No answer. HIPPA compliant message left on both answering machines.  Plan: telephonic outreach within 3-4 business days.  [name], RN, MSN, [place] Coordinator Cell: [phone]

## 2018-07-07 ENCOUNTER — Telehealth: Payer: Self-pay

## 2018-07-07 DIAGNOSIS — H2512 Age-related nuclear cataract, left eye: Secondary | ICD-10-CM | POA: Diagnosis not present

## 2018-07-07 DIAGNOSIS — Z992 Dependence on renal dialysis: Secondary | ICD-10-CM | POA: Diagnosis not present

## 2018-07-07 DIAGNOSIS — N186 End stage renal disease: Secondary | ICD-10-CM | POA: Diagnosis not present

## 2018-07-07 DIAGNOSIS — H25812 Combined forms of age-related cataract, left eye: Secondary | ICD-10-CM | POA: Diagnosis not present

## 2018-07-07 DIAGNOSIS — D631 Anemia in chronic kidney disease: Secondary | ICD-10-CM | POA: Diagnosis not present

## 2018-07-07 NOTE — Telephone Encounter (Signed)
Left detailed message per DPR  Patient contacted pre-catheterization at [facility] scheduled for:  07/11/2018 at 61 Verified arrival time and place:  NT at 0830 Confirmed AM meds to be taken pre-cath with sip of water: Take ASA Have responsible person to drive home post procedure and observe patient for 24 hours: yes

## 2018-07-08 DIAGNOSIS — N186 End stage renal disease: Secondary | ICD-10-CM | POA: Diagnosis not present

## 2018-07-08 DIAGNOSIS — Z992 Dependence on renal dialysis: Secondary | ICD-10-CM | POA: Diagnosis not present

## 2018-07-08 DIAGNOSIS — D631 Anemia in chronic kidney disease: Secondary | ICD-10-CM | POA: Diagnosis not present

## 2018-07-09 ENCOUNTER — Emergency Department (HOSPITAL_COMMUNITY): Payer: Medicare Other

## 2018-07-09 ENCOUNTER — Encounter (HOSPITAL_COMMUNITY): Payer: Self-pay | Admitting: *Deleted

## 2018-07-09 ENCOUNTER — Other Ambulatory Visit: Payer: Self-pay

## 2018-07-09 ENCOUNTER — Inpatient Hospital Stay (HOSPITAL_COMMUNITY)
Admission: EM | Admit: 2018-07-09 | Discharge: 2018-07-19 | DRG: 100 | Disposition: A | Discharge status: Home Health | Payer: Medicare Other | Attending: Internal Medicine | Admitting: Internal Medicine

## 2018-07-09 DIAGNOSIS — I161 Hypertensive emergency: Secondary | ICD-10-CM | POA: Diagnosis present

## 2018-07-09 DIAGNOSIS — Z880 Allergy status to penicillin: Secondary | ICD-10-CM

## 2018-07-09 DIAGNOSIS — G4733 Obstructive sleep apnea (adult) (pediatric): Secondary | ICD-10-CM | POA: Diagnosis present

## 2018-07-09 DIAGNOSIS — I2510 Atherosclerotic heart disease of native coronary artery without angina pectoris: Secondary | ICD-10-CM | POA: Diagnosis present

## 2018-07-09 DIAGNOSIS — D696 Thrombocytopenia, unspecified: Secondary | ICD-10-CM

## 2018-07-09 DIAGNOSIS — I6340 Cerebral infarction due to embolism of unspecified cerebral artery: Secondary | ICD-10-CM | POA: Diagnosis not present

## 2018-07-09 DIAGNOSIS — F419 Anxiety disorder, unspecified: Secondary | ICD-10-CM | POA: Diagnosis present

## 2018-07-09 DIAGNOSIS — Z9119 Patient's noncompliance with other medical treatment and regimen: Secondary | ICD-10-CM

## 2018-07-09 DIAGNOSIS — R569 Unspecified convulsions: Secondary | ICD-10-CM

## 2018-07-09 DIAGNOSIS — G40909 Epilepsy, unspecified, not intractable, without status epilepticus: Secondary | ICD-10-CM | POA: Diagnosis not present

## 2018-07-09 DIAGNOSIS — E8770 Fluid overload, unspecified: Secondary | ICD-10-CM | POA: Diagnosis present

## 2018-07-09 DIAGNOSIS — Z992 Dependence on renal dialysis: Secondary | ICD-10-CM

## 2018-07-09 DIAGNOSIS — E440 Moderate protein-calorie malnutrition: Secondary | ICD-10-CM | POA: Diagnosis present

## 2018-07-09 DIAGNOSIS — R000 Tachycardia, unspecified: Secondary | ICD-10-CM | POA: Diagnosis not present

## 2018-07-09 DIAGNOSIS — N186 End stage renal disease: Secondary | ICD-10-CM | POA: Diagnosis present

## 2018-07-09 DIAGNOSIS — I6349 Cerebral infarction due to embolism of other cerebral artery: Secondary | ICD-10-CM | POA: Diagnosis present

## 2018-07-09 DIAGNOSIS — R451 Restlessness and agitation: Secondary | ICD-10-CM | POA: Diagnosis present

## 2018-07-09 DIAGNOSIS — G92 Toxic encephalopathy: Secondary | ICD-10-CM | POA: Diagnosis not present

## 2018-07-09 DIAGNOSIS — B181 Chronic viral hepatitis B without delta-agent: Secondary | ICD-10-CM | POA: Diagnosis present

## 2018-07-09 DIAGNOSIS — Z0189 Encounter for other specified special examinations: Secondary | ICD-10-CM

## 2018-07-09 DIAGNOSIS — R404 Transient alteration of awareness: Secondary | ICD-10-CM | POA: Diagnosis not present

## 2018-07-09 DIAGNOSIS — R4020 Unspecified coma: Secondary | ICD-10-CM | POA: Diagnosis not present

## 2018-07-09 DIAGNOSIS — Z21 Asymptomatic human immunodeficiency virus [HIV] infection status: Secondary | ICD-10-CM | POA: Diagnosis present

## 2018-07-09 DIAGNOSIS — G40901 Epilepsy, unspecified, not intractable, with status epilepticus: Principal | ICD-10-CM | POA: Diagnosis present

## 2018-07-09 DIAGNOSIS — D638 Anemia in other chronic diseases classified elsewhere: Secondary | ICD-10-CM | POA: Diagnosis present

## 2018-07-09 DIAGNOSIS — Z79899 Other long term (current) drug therapy: Secondary | ICD-10-CM

## 2018-07-09 DIAGNOSIS — Z789 Other specified health status: Secondary | ICD-10-CM

## 2018-07-09 DIAGNOSIS — Z885 Allergy status to narcotic agent status: Secondary | ICD-10-CM

## 2018-07-09 DIAGNOSIS — Z6821 Body mass index (BMI) 21.0-21.9, adult: Secondary | ICD-10-CM

## 2018-07-09 DIAGNOSIS — J9601 Acute respiratory failure with hypoxia: Secondary | ICD-10-CM | POA: Diagnosis not present

## 2018-07-09 DIAGNOSIS — Z888 Allergy status to other drugs, medicaments and biological substances status: Secondary | ICD-10-CM

## 2018-07-09 DIAGNOSIS — I6330 Cerebral infarction due to thrombosis of unspecified cerebral artery: Secondary | ICD-10-CM

## 2018-07-09 DIAGNOSIS — Z23 Encounter for immunization: Secondary | ICD-10-CM

## 2018-07-09 DIAGNOSIS — I351 Nonrheumatic aortic (valve) insufficiency: Secondary | ICD-10-CM

## 2018-07-09 DIAGNOSIS — R4182 Altered mental status, unspecified: Secondary | ICD-10-CM | POA: Diagnosis not present

## 2018-07-09 DIAGNOSIS — R918 Other nonspecific abnormal finding of lung field: Secondary | ICD-10-CM | POA: Diagnosis not present

## 2018-07-09 DIAGNOSIS — R7989 Other specified abnormal findings of blood chemistry: Secondary | ICD-10-CM | POA: Diagnosis present

## 2018-07-09 DIAGNOSIS — Z79891 Long term (current) use of opiate analgesic: Secondary | ICD-10-CM

## 2018-07-09 DIAGNOSIS — R0902 Hypoxemia: Secondary | ICD-10-CM | POA: Diagnosis not present

## 2018-07-09 DIAGNOSIS — Z9842 Cataract extraction status, left eye: Secondary | ICD-10-CM

## 2018-07-09 DIAGNOSIS — I120 Hypertensive chronic kidney disease with stage 5 chronic kidney disease or end stage renal disease: Secondary | ICD-10-CM | POA: Diagnosis present

## 2018-07-09 DIAGNOSIS — R748 Abnormal levels of other serum enzymes: Secondary | ICD-10-CM | POA: Diagnosis not present

## 2018-07-09 DIAGNOSIS — Z87891 Personal history of nicotine dependence: Secondary | ICD-10-CM

## 2018-07-09 DIAGNOSIS — I352 Nonrheumatic aortic (valve) stenosis with insufficiency: Secondary | ICD-10-CM | POA: Diagnosis present

## 2018-07-09 DIAGNOSIS — I428 Other cardiomyopathies: Secondary | ICD-10-CM | POA: Diagnosis present

## 2018-07-09 DIAGNOSIS — N2581 Secondary hyperparathyroidism of renal origin: Secondary | ICD-10-CM | POA: Diagnosis present

## 2018-07-09 DIAGNOSIS — Z9841 Cataract extraction status, right eye: Secondary | ICD-10-CM

## 2018-07-09 DIAGNOSIS — Z8249 Family history of ischemic heart disease and other diseases of the circulatory system: Secondary | ICD-10-CM

## 2018-07-09 DIAGNOSIS — K219 Gastro-esophageal reflux disease without esophagitis: Secondary | ICD-10-CM | POA: Diagnosis present

## 2018-07-09 DIAGNOSIS — J449 Chronic obstructive pulmonary disease, unspecified: Secondary | ICD-10-CM | POA: Diagnosis present

## 2018-07-09 DIAGNOSIS — I10 Essential (primary) hypertension: Secondary | ICD-10-CM | POA: Diagnosis not present

## 2018-07-09 LAB — CBC
HCT: 39.1 % (ref 39.0–52.0)
Hemoglobin: 12.5 g/dL — ABNORMAL LOW (ref 13.0–17.0)
MCH: 29.1 pg (ref 26.0–34.0)
MCHC: 32 g/dL (ref 30.0–36.0)
MCV: 91.1 fL (ref 80.0–100.0)
NRBC: 0 % (ref 0.0–0.2)
PLATELETS: 140 10*3/uL — AB (ref 150–400)
RBC: 4.29 MIL/uL (ref 4.22–5.81)
RDW: 17.2 % — AB (ref 11.5–15.5)
WBC: 11.6 10*3/uL — ABNORMAL HIGH (ref 4.0–10.5)

## 2018-07-09 LAB — BASIC METABOLIC PANEL
Anion gap: 20 — ABNORMAL HIGH (ref 5–15)
BUN: 127 mg/dL — AB (ref 8–23)
CALCIUM: 9.4 mg/dL (ref 8.9–10.3)
CO2: 14 mmol/L — ABNORMAL LOW (ref 22–32)
Chloride: 103 mmol/L (ref 98–111)
Creatinine, Ser: 21.35 mg/dL — ABNORMAL HIGH (ref 0.61–1.24)
GFR calc Af Amer: 2 mL/min — ABNORMAL LOW (ref >60)
GFR, EST NON AFRICAN AMERICAN: 2 mL/min — AB (ref >60)
GLUCOSE: 104 mg/dL — AB (ref 70–99)
POTASSIUM: 4.8 mmol/L (ref 3.5–5.1)
SODIUM: 137 mmol/L (ref 135–145)

## 2018-07-09 LAB — HEPATIC FUNCTION PANEL
AST: 15 U/L (ref 15–41)
Albumin: 3.6 g/dL (ref 3.5–5.0)
Alkaline Phosphatase: 65 U/L (ref 38–126)
BILIRUBIN DIRECT: 0.1 mg/dL (ref 0.0–0.2)
BILIRUBIN INDIRECT: 0.9 mg/dL (ref 0.3–0.9)
BILIRUBIN TOTAL: 1 mg/dL (ref 0.3–1.2)
Total Protein: 7.9 g/dL (ref 6.5–8.1)

## 2018-07-09 LAB — CBG MONITORING, ED: Glucose-Capillary: 101 mg/dL — ABNORMAL HIGH (ref 70–99)

## 2018-07-09 MED ORDER — ALBUTEROL SULFATE (2.5 MG/3ML) 0.083% IN NEBU
5.0000 mg | INHALATION_SOLUTION | Freq: Once | RESPIRATORY_TRACT | Status: AC
  Administered 2018-07-09: 5 mg via RESPIRATORY_TRACT
  Filled 2018-07-09: qty 6

## 2018-07-09 NOTE — H&P (Signed)
TRH H&P   Patient Demographics:    Jeffrey [name], is a 62 y.o. male  MRN: [id number]   DOB - [birth year]  Admit Date - 07/09/2018  Outpatient Primary MD for the patient is Velna Ochs, MD  Referring MD/NP/PA: Pattricia Boss  Outpatient Specialists:  Dr. Zollie Scale (Shari Prows, [place])  Patient coming from:   Chief Complaint  Patient presents with  . Seizures      HPI:    Jeffrey [name]  is a 62 y.o. male, w Hiv, Hepatitis B, Anemia/ Thrombocytopenia, OSA,  Seizure disorder, Aortic stenosis, ESRD on HD (home dialysis 4 times per week), apparently has not had dialysis since last Saturday apparently per family due to L AVF placement, and eye surgery ?Marland Kitchen  Pt has apparently been somnolent and not quite himself for the past 1 week. No new medications per family.  Pt was brought in this evening due to seizure.    In Ed,   CXR IMPRESSION: 1. Increased peribronchial thickening most suggestive of progressive pulmonary edema, atypical infection felt less likely. 2. Unchanged left basilar opacity and pleural effusion from recent Exam.  Wbc 11.6, Hgb 12.5, Plt 140 Na 137, k 4.8,  Bun 127, Creatinine 21.35 Ast 15, Alt <5  CT brain pending  Pt will be admitted for seizure as well as ESRD w uremia, requiring dialysis    Review of systems:    In addition to the HPI above,  No Fever-chills, No Headache, No changes with Vision or hearing, No problems swallowing food or Liquids, No Chest pain, Cough or Shortness of Breath, No Abdominal pain, No Nausea or Vommitting, Bowel movements are regular, No Blood in stool or Urine, No dysuria, No new skin rashes or bruises, No new joints pains-aches,  No new weakness, tingling, numbness in any extremity, No recent weight gain or loss, No polyuria, polydypsia or polyphagia, No significant Mental Stressors.  A full 10 point Review of Systems was  done, except as stated above, all other Review of Systems were negative.   With Past History of the following :    Past Medical History:  Diagnosis Date  . Anemia   . Anxiety   . Aortic valve stenosis   . Arthritis   . Asthma    per pt hx  . Dyspnea   . End stage renal disease on home HD 07/10/2011   Started HD in September 2012 at [facility] with a tunneled HD catheter, now on home HD with NxtStage. Dialyzing through AVF L lower arm with buttonhole technique as of mid 2014. His brother does the HD treatments at home.  They are roommates for 23 years.  The brother works 3rd shift and gets off about 8am and then puts Mr Randazzo on HD in the morning after getting home. Most of the time he does HD about 4 times a week, for about 4 hours per treatment. Cause  of ESRD was HTN according to patient. He says he let his health go and ending up with complications, and that he didn't like seeing doctors in those days.  He says he was diagnosed with severe HTN when he lived in [place] in his 30's.   . Hepatitis    Hepatitis B  . Hepatitis B carrier ([place])   . HIV infection ([place])   . Hypertension   . Hyperthyroidism    normal now  . Pneumonia several yrs ago  . Seizure ([place])   . Seizures ([place]) 06/02/2011   had a mild one in Sept. 2019  . Sleep apnea    to start Cpap soon  . Thrombocytopenia ([place])       Past Surgical History:  Procedure Laterality Date  . AV FISTULA PLACEMENT  06/02/11   Left radiocephalic AVF  . BIOPSY  02/17/2018   Procedure: BIOPSY;  Surgeon: Milus Banister, MD;  Location: [facility];  Service: Endoscopy;;  . COLONOSCOPY    . COLONOSCOPY WITH PROPOFOL N/A 01/27/2018   Procedure: COLONOSCOPY WITH PROPOFOL;  Surgeon: [name], MD;  Location: [facility] ENDOSCOPY;  Service: Gastroenterology;  Laterality: N/A;  . CORONARY ANGIOPLASTY    . ERCP  03/21/2018   AT [place]  . ESOPHAGOGASTRODUODENOSCOPY N/A 02/17/2018   Procedure: ESOPHAGOGASTRODUODENOSCOPY (EGD);  Surgeon:  Milus Banister, MD;  Location: [facility];  Service: Endoscopy;  Laterality: N/A;  . ESOPHAGOGASTRODUODENOSCOPY (EGD) WITH PROPOFOL N/A 01/27/2018   Procedure: ESOPHAGOGASTRODUODENOSCOPY (EGD) WITH PROPOFOL;  Surgeon: [name], MD;  Location: [facility] ENDOSCOPY;  Service: Gastroenterology;  Laterality: N/A;  . ESOPHAGOGASTRODUODENOSCOPY (EGD) WITH PROPOFOL N/A 04/03/2018   Procedure: ESOPHAGOGASTRODUODENOSCOPY (EGD) WITH PROPOFOL;  Surgeon: Clarene [place], MD;  Location: [place];  Service: Endoscopy;  Laterality: N/A;  . EUS N/A 02/17/2018   Procedure: UPPER ENDOSCOPIC ULTRASOUND (EUS) RADIAL;  Surgeon: Milus Banister, MD;  Location: [facility];  Service: Endoscopy;  Laterality: N/A;  . fistulaogram     x 2 last 2 years  . IR DIALY SHUNT INTRO NEEDLE/INTRACATH INITIAL W/IMG LEFT Left 04/20/2018  . IR FLUORO GUIDE CV LINE RIGHT  09/16/2017  . IR US GUIDE VASC ACCESS RIGHT  09/16/2017  . IRRIGATION AND DEBRIDEMENT KNEE Left 09/15/2017   Procedure: IRRIGATION AND DEBRIDEMENT KNEE; arthroscopic clean out;  Surgeon: [name], MD;  Location: [facility];  Service: Orthopedics;  Laterality: Left;  . OTHER SURGICAL HISTORY     removal temporary HD catheter   . REVISON OF ARTERIOVENOUS FISTULA Left 10/10/2015   Procedure: REVISON OF LEFT RADIOCEPHALIC ARTERIOVENOUS FISTULA;  Surgeon: Angelia Mould, MD;  Location: [place];  Service: Vascular;  Laterality: Left;  . REVISON OF ARTERIOVENOUS FISTULA Left 02/07/2016   Procedure: REPAIR OF PSEUDO-ANEUREYSM OF LEFT ARM  ARTERIOVENOUS FISTULA;  Surgeon: Angelia Mould, MD;  Location: [place];  Service: Vascular;  Laterality: Left;  . REVISON OF ARTERIOVENOUS FISTULA Left 8/46/6599   Procedure: PLICATION OF LEFT ARM RADIOCEPHALIC ARTERIOVENOUS FISTULA PSEUDOANEURYSM;  Surgeon: Angelia Mould, MD;  Location: [place];  Service: Vascular;  Laterality: Left;  . REVISON OF ARTERIOVENOUS FISTULA Left 07/04/2018   Procedure: REVISION OF  ARTERIOVENOUS RADIOCEPHALIC FISTULA;  Surgeon: Angelia Mould, MD;  Location: [place];  Service: Vascular;  Laterality: Left;  . RIGHT/LEFT HEART CATH AND CORONARY ANGIOGRAPHY N/A 02/17/2017   Procedure: Right/Left Heart Cath and Coronary Angiography;  Surgeon: Sherren Mocha, MD;  Location: [place] [facility];  Service: Cardiovascular;  Laterality: N/A;  . TEE  WITHOUT CARDIOVERSION N/A 09/11/2016   Procedure: TRANSESOPHAGEAL ECHOCARDIOGRAM (TEE);  Surgeon: Dorothy Spark, MD;  Location: [place];  Service: Cardiovascular;  Laterality: N/A;  . TEE WITHOUT CARDIOVERSION N/A 05/05/2018   Procedure: TRANSESOPHAGEAL ECHOCARDIOGRAM (TEE);  Surgeon: Adrian Prows, MD;  Location: [facility] ENDOSCOPY;  Service: Cardiovascular;  Laterality: N/A;  Prefer after 3:30 PM      Social History:     Social History   Tobacco Use  . Smoking status: Former Smoker    Years: 10.00    Types: Cigarettes    Last attempt to quit: 08/08/2015    Years since quitting: 2.9  . Smokeless tobacco: Never Used  . Tobacco comment: casual smoking for 10 years  Substance Use Topics  . Alcohol use: No    Alcohol/week: 0.0 standard drinks    Comment: quit 2004     Lives - at home  Mobility - walks w cane or walker   Family History :     Family History  Problem Relation Age of Onset  . Hypertension Mother   . Diabetes Mother   . Cancer Father       Home Medications:   Prior to Admission medications   Medication Sig Start Date End Date Taking? Authorizing Provider  albuterol (PROVENTIL) (2.5 MG/3ML) 0.083% nebulizer solution Take 2.5 mg by nebulization every 6 (six) hours as needed for wheezing or shortness of breath.   Yes [provider]  ALPRAZolam (XANAX) 0.25 MG tablet Take 0.25 mg by mouth at bedtime as needed for anxiety.    Yes [provider]  amLODipine (NORVASC) 5 MG tablet Take 1 tablet (5 mg total) by mouth daily. 07/05/18 07/05/19 Yes Regalado, Belkys A, MD    bictegravir-emtricitabine-tenofovir AF (BIKTARVY) 50-200-25 MG TABS tablet Take 1 tablet by mouth daily. 12/27/17  Yes Campbell Riches, MD  calcium acetate (PHOSLO) 667 MG capsule Take 1,334-2,001 mg by mouth See admin instructions. Take 2,001 mg three times a day with each meal and 1,334 mg with each snack 06/06/15  Yes [provider]  carvedilol (COREG) 25 MG tablet Take 1 tablet (25 mg total) by mouth 2 (two) times daily with a meal. 06/20/18  Yes Velna Ochs, MD  cloNIDine (CATAPRES - DOSED IN MG/24 HR) 0.3 mg/24hr patch Place 1 patch (0.3 mg total) onto the skin once a week. 06/20/18  Yes Velna Ochs, MD  cyclobenzaprine (FLEXERIL) 10 MG tablet Take 1 tablet (10 mg total) by mouth at bedtime as needed for muscle spasms. 06/14/18  Yes Candice Camp, MD  epoetin alfa (EPOGEN,PROCRIT) 2000 UNIT/ML injection Inject 2,000 Units into the vein 3 (three) times a week.    Yes [provider]  ethyl chloride spray Apply 1 application topically daily as needed (HD- to numb).  05/20/15  Yes [provider]  heparin 1000 UNIT/ML injection Inject 3,000 Units into the vein 4 (four) times a week.    Yes [provider]  hydrALAZINE (APRESOLINE) 100 MG tablet TAKE 1 TABLET(100 MG) BY MOUTH THREE TIMES DAILY Patient taking differently: Take 100 mg by mouth 3 (three) times daily.  06/13/18  Yes Aldine Contes, MD  hydrOXYzine (ATARAX/VISTARIL) 25 MG tablet Take 1 tablet (25 mg total) by mouth 4 (four) times daily as needed for itching. 06/20/18  Yes Velna Ochs, MD  Ipratropium-Albuterol (COMBIVENT IN) Inhale 2 puffs into the lungs every 6 (six) hours as needed (as needed for shortness of breath).    Yes [provider]  Lacosamide (VIMPAT)  100 MG TABS Take 1 tablet (100 mg total) by mouth 2 (two) times daily. 06/06/18  Yes Marcial Pacas, MD  multivitamin (RENA-VIT) TABS tablet Take 1 tablet by mouth daily.     Yes [provider]  omeprazole  (PRILOSEC) 20 MG capsule Take 1 capsule (20 mg total) by mouth daily. Patient taking differently: Take 20 mg by mouth 2 (two) times daily.  01/31/18  Yes [name], MD  oxyCODONE (ROXICODONE) 5 MG immediate release tablet Take 1 tablet (5 mg total) by mouth every 4 (four) hours as needed. 07/04/18  Yes Angelia Mould, MD  traZODone (DESYREL) 50 MG tablet TAKE 1 TABLET(50 MG) BY MOUTH AT BEDTIME AS NEEDED FOR SLEEP Patient taking differently: Take 50 mg by mouth at bedtime as needed for sleep.  06/24/18  Yes Campbell Riches, MD  VOLTAREN 1 % GEL APPLY 2 GRAMS EXTERNALLY TO THE AFFECTED AREA FOUR TIMES DAILY Patient taking differently: Apply 2 g topically 4 (four) times daily as needed (pain).  05/09/18  Yes Jean Rosenthal, MD     Allergies:     Allergies  Allergen Reactions  . Lisinopril Anaphylaxis and Shortness Of Breath    Throat swelling  . Penicillins Anaphylaxis and Other (See Comments)    Childhood allergy Has patient had a PCN reaction causing immediate rash, facial/tongue/throat swelling, SOB or lightheadedness with hypotension: Yes Has patient had a PCN reaction causing severe rash involving mucus membranes or skin necrosis: Unk Has patient had a PCN reaction that required hospitalization: Unk Has patient had a PCN reaction occurring within the last 10 years: No If all of the above answers are "NO", then may proceed with Cephalosporin use.   . Fentanyl Other (See Comments)    Lethargy, AMS  . Morphine And Related Other (See Comments)    "Not Himself"     Physical Exam:   Vitals  Blood pressure (!) 137/105, pulse 88, temperature 98.9 F (37.2 C), temperature source Rectal, resp. rate (!) 21, height 6' (1.829 m), weight 74.3 kg, SpO2 96 %.   1. General lying in bed , seems confused    2. Awake Alert, Oriented X 1 (person)  3. No F.N deficits, ALL C.Nerves Intact, Strength 5/5 all 4 extremities, Sensation intact all 4 extremities, Plantars down going.  4.  Ears and Eyes appear Normal, Conjunctivae clear, PERRLA. Moist Oral Mucosa. No photophobia  5. Supple Neck, No JVD, No cervical lymphadenopathy appriciated, No Carotid Bruits.  6. Symmetrical Chest wall movement, Good air movement bilaterally, CTAB.  7. RRR, s1, s2, 2/6 sem rusb   8. Positive Bowel Sounds, Abdomen Soft, No tenderness, No organomegaly appriciated,No rebound -guarding or rigidity.  9.  No Cyanosis, Normal Skin Turgor, No Skin Rash or Bruise.  10. Good muscle tone,  joints appear normal , no effusions, Normal ROM.  11. No Palpable Lymph Nodes in Neck or Axillae     Data Review:    CBC Recent Labs  Lab 07/04/18 0630 07/05/18 0356 07/09/18 2149  WBC  --  9.0 11.6*  HGB 11.2* 10.3* 12.5*  HCT 33.0* 34.5* 39.1  PLT  --  163 140*  MCV  --  93.5 91.1  MCH  --  27.9 29.1  MCHC  --  29.9* 32.0  RDW  --  17.5* 17.2*   ------------------------------------------------------------------------------------------------------------------  Chemistries  Recent Labs  Lab 07/04/18 0630 07/05/18 0356 07/09/18 2149  NA 137 136 137  K 3.5 3.9 4.8  CL  --  98 103  CO2  --  21* 14*  GLUCOSE 96 94 104*  BUN  --  75* 127*  CREATININE  --  14.66* 21.35*  CALCIUM  --  8.9 9.4  AST  --   --  15  ALT  --   --  <5  ALKPHOS  --   --  65  BILITOT  --   --  1.0   ------------------------------------------------------------------------------------------------------------------ estimated creatinine clearance is 3.7 mL/min (A) (by C-G formula based on SCr of 21.35 mg/dL (H)). ------------------------------------------------------------------------------------------------------------------ No results for input(s): TSH, T4TOTAL, T3FREE, THYROIDAB in the last 72 hours.  Invalid input(s): FREET3  Coagulation profile No results for input(s): INR, PROTIME in the last 168  hours. ------------------------------------------------------------------------------------------------------------------- No results for input(s): DDIMER in the last 72 hours. -------------------------------------------------------------------------------------------------------------------  Cardiac Enzymes No results for input(s): CKMB, TROPONINI, MYOGLOBIN in the last 168 hours.  Invalid input(s): CK ------------------------------------------------------------------------------------------------------------------    Component Value Date/Time   BNP 1,135.9 (H) 05/27/2018 2012     ---------------------------------------------------------------------------------------------------------------  Urinalysis    Component Value Date/Time   COLORURINE AMBER (A) 06/14/2011 1513   APPEARANCEUR CLOUDY (A) 06/14/2011 1513   LABSPEC 1.020 06/14/2011 1513   PHURINE 6.0 06/14/2011 1513   GLUCOSEU NEGATIVE 06/14/2011 1513   HGBUR LARGE (A) 06/14/2011 1513   BILIRUBINUR SMALL (A) 06/14/2011 1513   KETONESUR NEGATIVE 06/14/2011 1513   PROTEINUR >300 (A) 06/14/2011 1513   UROBILINOGEN 0.2 06/14/2011 1513   NITRITE NEGATIVE 06/14/2011 1513   LEUKOCYTESUR SMALL (A) 06/14/2011 1513    ----------------------------------------------------------------------------------------------------------------   Imaging Results:    Dg Chest Port 1 View  Result Date: 07/09/2018 CLINICAL DATA:  Cough. EXAM: PORTABLE CHEST 1 VIEW COMPARISON:  Chest radiograph 07/04/2018. Additional prior chest radiographs reviewed. FINDINGS: Left pleural effusion and basilar opacity is unchanged from recent exam. Progressive peribronchial thickening. No new airspace disease. Unchanged heart size and mediastinal contours. No pneumothorax. IMPRESSION: 1. Increased peribronchial thickening most suggestive of progressive pulmonary edema, atypical infection felt less likely. 2. Unchanged left basilar opacity and pleural effusion from  recent exam. Electronically Signed   By: Keith Rake M.D.   On: 07/09/2018 22:53    ekg nsr at 95, nl axis, no st-t changes c/w ischemia   Assessment & Plan:    Principal Problem:   Seizure ([place]) Active Problems:   End stage renal disease on home HD   Thrombocytopenia ([facility])   Anemia of chronic disease   Altered mental status    Seizure do, AMS Secondary to ? Uremia CT brain pending Check b12, esr, tsh, rpr, ammonia Continue Vimpat Ativan 3m iv q4h prn seizure Consider neurology consultation if mental status not improving with dialysis  ESRD on HD  Cont Phoslo Cont Renavite Nephrology consulted by ED, appreciate input Requires dialysis Check cmp in am  Anemia/ thrombocytopenia Check cbc in am  HIV/ Hepatitis B HOLD Biktarvy, please discuss with ID in AM if appropriate to use in patient with ESRD Check lactic acid  Hypertension Cont Amlodipine Cont Carvedilol Cont Hydralazine  Aortic stenosis Going to have surgery by Dr. CDeitra Mayoon 10/30     DVT Prophylaxis Heparin - SCDs   AM Labs Ordered, also please review Full Orders  Family Communication: Admission, patients condition and plan of care including tests being ordered have been discussed with the patient and family who indicate understanding and agree with the plan and Code Status.  Code Status  FULL CODE  Likely DC to  home  Condition GUARDED  Consults called: nephrology by ED, appreciate input  Admission status: observation,  Pt will require hospitalization for dialysis and for seizure and observation of AMS. If not improving might require change to inpatient status  Time spent in minutes : 70   Jani Gravel M.D on 07/09/2018 at 11:27 PM  Between 7am to 7pm - Pager - [phone] . After 7pm go to [email or web address] - password [email or web address]  [facility] - Office  [phone]

## 2018-07-09 NOTE — ED Provider Notes (Signed)
[facility] EMERGENCY DEPARTMENT Provider Note   CSN: [id number] Arrival date & time: 07/09/18  2130     History   Chief Complaint Chief Complaint  Patient presents with  . Seizures    HPI Jeffrey [name] is a 62 y.o. male.  HPI  Level 5 caveat secondary to patient oriented and unable to give history 62 year old male history of HIV, end-stage renal disease on home dialysis, aortic valve stenosis, presents today from home with reports of a seizure. Review of records reveals that he has a seizure disorder and is on Vimpat.  He was recently discharged from the hospital after admission for AV fistula.  While in PACU, he was noted to be very hypertensive and was admitted to the hospital with hypertensive urgency.  He was discharged home the next day. 10:32 PM Brother at bedside now.  States patient has not been well this week since discharge from hospital.  He has not had dialysis since last Saturday.  He has been less responsive, weaker, has not had anything to eat or been able to get out of bed today. Past Medical History:  Diagnosis Date  . Anemia   . Anxiety   . Aortic valve stenosis   . Arthritis   . Asthma    per pt hx  . Dyspnea   . End stage renal disease on home HD 07/10/2011   Started HD in September 2012 at [facility] with a tunneled HD catheter, now on home HD with NxtStage. Dialyzing through AVF L lower arm with buttonhole technique as of mid 2014. His brother does the HD treatments at home.  They are roommates for 23 years.  The brother works 3rd shift and gets off about 8am and then puts Jeffrey [name] on HD in the morning after getting home. Most of the time he does HD about 4 times a week, for about 4 hours per treatment. Cause of ESRD was HTN according to patient. He says he let his health go and ending up with complications, and that he didn't like seeing doctors in those days.  He says he was diagnosed with severe HTN when he lived in [place] in his 57's.     . Hepatitis    Hepatitis B  . Hepatitis B carrier ([place])   . HIV infection ([place])   . Hypertension   . Hyperthyroidism    normal now  . Pneumonia several yrs ago  . Seizure ([place])   . Seizures ([place]) 06/02/2011   had a mild one in Sept. 2019  . Sleep apnea    to start Cpap soon  . Thrombocytopenia [facility])     Patient Active Problem List   Diagnosis Date Noted  . Hypertensive urgency 07/04/2018  . OSA (obstructive sleep apnea) 05/31/2018  . Onychomycosis of right great toe 05/09/2018  . 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 Acute bacterial endocarditis   . Left shoulder pain   . 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 Coagulase negative Staphylococcus bacteremia   . Hypervolemia   . ESRD on hemodialysis ([place])   . Asymptomatic HIV infection ([place])   . CAD (coronary artery disease) 04/19/2018  . Dyspnea 04/10/2018  . Loud snoring 04/08/2018  . Upper GI bleed 04/08/2018  . Malnutrition of moderate degree 02/23/2018  . Seizure ([place]) 02/22/2018  . Mass of duodenum   . Antibiotic long-term use   . Septic arthritis ([place]) 09/15/2017  . Encounter for incision and drainage procedure 09/15/2017  .  Effusion of left knee 09/08/2017  . Intertrigo 07/09/2017  . AC joint arthropathy 06/16/2017  . Pseudoaneurysm of arteriovenous dialysis fistula ([place]) 12/23/2016  . Eczema 12/10/2016  . Bilateral low back pain without sciatica   . Aortic valve insufficiency   . Fall 09/08/2016  . Knee abrasion, right, initial encounter 09/08/2016  . Lumbosacral spondylosis without myelopathy 08/03/2016  . Pulmonary nodules 08/03/2016  . Essential hypertension   . Anemia of chronic disease 04/08/2016  . Secondary hyperparathyroidism ([place]) 02/09/2014  . Asthma 02/09/2014  . Nuclear sclerotic cataract of both eyes 12/12/2013  . Pruritus 10/26/2013  . Myopia with presbyopia of both eyes 10/26/2013  . COPD (chronic obstructive pulmonary disease) ([place]) 06/27/2013  .  Thrombocytopenia ([place]) 01/09/2013  . HBV (hepatitis B virus) infection 01/09/2013  . Anxiety 07/22/2011  . End stage renal disease on home HD 07/10/2011  . HIV disease ([place]) 06/17/2011    Past Surgical History:  Procedure Laterality Date  . AV FISTULA PLACEMENT  06/02/11   Left radiocephalic AVF  . BIOPSY  02/17/2018   Procedure: BIOPSY;  Surgeon: Milus Banister, MD;  Location: [facility] ENDOS[facility];  Service: Endoscopy;;  . COLONOSCOPY    . COLONOSCOPY WITH PROPOFOL N/A 01/27/2018   Procedure: COLONOSCOPY WITH PROPOFOL;  Surgeon: [name], MD;  Location: [facility] ENDOSCOPY;  Service: Gastroenterology;  Laterality: N/A;  . CORONARY ANGIOPLASTY    . ERCP  03/21/2018   AT [place]  . ESOPHAGOGASTRODUODENOSCOPY N/A 02/17/2018   Procedure: ESOPHAGOGASTRODUODENOSCOPY (EGD);  Surgeon: Milus Banister, MD;  Location: [facility];  Service: Endoscopy;  Laterality: N/A;  . ESOPHAGOGASTRODUODENOSCOPY (EGD) WITH PROPOFOL N/A 01/27/2018   Procedure: ESOPHAGOGASTRODUODENOSCOPY (EGD) WITH PROPOFOL;  Surgeon: [name], MD;  Location: [facility] ENDOSCOPY;  Service: Gastroenterology;  Laterality: N/A;  . ESOPHAGOGASTRODUODENOSCOPY (EGD) WITH PROPOFOL N/A 04/03/2018   Procedure: ESOPHAGOGASTRODUODENOSCOPY (EGD) WITH PROPOFOL;  Surgeon: Clarene Essex, MD;  Location: [facility];  Service: Endoscopy;  Laterality: N/A;  . EUS N/A 02/17/2018   Procedure: UPPER ENDOSCOPIC ULTRASOUND (EUS) RADIAL;  Surgeon: Milus Banister, MD;  Location: [facility];  Service: Endoscopy;  Laterality: N/A;  . fistulaogram     x 2 last 2 years  . IR DIALY SHUNT INTRO NEEDLE/INTRACATH INITIAL W/IMG LEFT Left 04/20/2018  . IR FLUORO GUIDE CV LINE RIGHT  09/16/2017  . IR US GUIDE VASC ACCESS RIGHT  09/16/2017  . IRRIGATION AND DEBRIDEMENT KNEE Left 09/15/2017   Procedure: IRRIGATION AND DEBRIDEMENT KNEE; arthroscopic clean out;  Surgeon: [name], MD;  Location: [facility];  Service: Orthopedics;  Laterality: Left;  . OTHER SURGICAL  HISTORY     removal temporary HD catheter   . REVISON OF ARTERIOVENOUS FISTULA Left 10/10/2015   Procedure: REVISON OF LEFT RADIOCEPHALIC ARTERIOVENOUS FISTULA;  Surgeon: Angelia Mould, MD;  Location: [facility];  Service: Vascular;  Laterality: Left;  . REVISON OF ARTERIOVENOUS FISTULA Left 02/07/2016   Procedure: REPAIR OF PSEUDO-ANEUREYSM OF LEFT ARM  ARTERIOVENOUS FISTULA;  Surgeon: Angelia Mould, MD;  Location: [facility];  Service: Vascular;  Laterality: Left;  . REVISON OF ARTERIOVENOUS FISTULA Left 2/63/7858   Procedure: PLICATION OF LEFT ARM RADIOCEPHALIC ARTERIOVENOUS FISTULA PSEUDOANEURYSM;  Surgeon: Angelia Mould, MD;  Location: [facility];  Service: Vascular;  Laterality: Left;  . REVISON OF ARTERIOVENOUS FISTULA Left 07/04/2018   Procedure: REVISION OF ARTERIOVENOUS RADIOCEPHALIC FISTULA;  Surgeon: Angelia Mould, MD;  Location: [facility];  Service: Vascular;  Laterality: Left;  . RIGHT/LEFT HEART CATH AND CORONARY ANGIOGRAPHY N/A 02/17/2017  Procedure: Right/Left Heart Cath and Coronary Angiography;  Surgeon: Sherren Mocha, MD;  Location: [facility] CV LAB;  Service: Cardiovascular;  Laterality: N/A;  . TEE WITHOUT CARDIOVERSION N/A 09/11/2016   Procedure: TRANSESOPHAGEAL ECHOCARDIOGRAM (TEE);  Surgeon: Dorothy Spark, MD;  Location: [facility];  Service: Cardiovascular;  Laterality: N/A;  . TEE WITHOUT CARDIOVERSION N/A 05/05/2018   Procedure: TRANSESOPHAGEAL ECHOCARDIOGRAM (TEE);  Surgeon: Adrian Prows, MD;  Location: [facility];  Service: Cardiovascular;  Laterality: N/A;  Prefer after 3:30 PM        Home Medications    Prior to Admission medications   Medication Sig Start Date End Date Taking? Authorizing Provider  albuterol (PROVENTIL) (2.5 MG/3ML) 0.083% nebulizer solution Take 2.5 mg by nebulization every 6 (six) hours as needed for wheezing or shortness of breath.    [provider]  ALPRAZolam Duanne Moron) 0.25 MG tablet Take 0.25 mg by mouth at  bedtime as needed for anxiety.     [provider]  amLODipine (NORVASC) 5 MG tablet Take 1 tablet (5 mg total) by mouth daily. 07/05/18 07/05/19  Regalado, Belkys A, MD  bictegravir-emtricitabine-tenofovir AF (BIKTARVY) 50-200-25 MG TABS tablet Take 1 tablet by mouth daily. 12/27/17   Campbell Riches, MD  calcium acetate (PHOSLO) 667 MG capsule Take 1,334-2,001 mg by mouth See admin instructions. Take 2,001 mg three times a day with each meal and 1,334 mg with each snack 06/06/15   [provider]  carvedilol (COREG) 25 MG tablet Take 1 tablet (25 mg total) by mouth 2 (two) times daily with a meal. 06/20/18   Velna Ochs, MD  cloNIDine (CATAPRES - DOSED IN MG/24 HR) 0.3 mg/24hr patch Place 1 patch (0.3 mg total) onto the skin once a week. 06/20/18   Velna Ochs, MD  cyclobenzaprine (FLEXERIL) 10 MG tablet Take 1 tablet (10 mg total) by mouth at bedtime as needed for muscle spasms. 06/14/18   Candice Camp, MD  epoetin alfa (EPOGEN,PROCRIT) 2000 UNIT/ML injection Inject 2,000 Units into the vein 3 (three) times a week.     [provider]  ethyl chloride spray Apply 1 application topically daily as needed (HD- to numb).  05/20/15   [provider]  heparin 1000 UNIT/ML injection Inject 3,000 Units into the vein 4 (four) times a week.     [provider]  hydrALAZINE (APRESOLINE) 100 MG tablet TAKE 1 TABLET(100 MG) BY MOUTH THREE TIMES DAILY Patient taking differently: Take 100 mg by mouth 3 (three) times daily.  06/13/18   Aldine Contes, MD  hydrOXYzine (ATARAX/VISTARIL) 25 MG tablet Take 1 tablet (25 mg total) by mouth 4 (four) times daily as needed for itching. 06/20/18   Velna Ochs, MD  Ipratropium-Albuterol (COMBIVENT IN) Inhale 2 puffs into the lungs every 6 (six) hours as needed (as needed for shortness of breath).     [provider]  Lacosamide (VIMPAT) 100 MG TABS Take 1 tablet (100 mg total) by mouth 2 (two) times daily.  06/06/18   Marcial Pacas, MD  multivitamin (RENA-VIT) TABS tablet Take 1 tablet by mouth daily.      [provider]  omeprazole (PRILOSEC) 20 MG capsule Take 1 capsule (20 mg total) by mouth daily. Patient taking differently: Take 20 mg by mouth 2 (two) times daily.  01/31/18   [name], MD  oxyCODONE (ROXICODONE) 5 MG immediate release tablet Take 1 tablet (5 mg total) by mouth every 4 (four) hours as needed. 07/04/18   Angelia Mould, MD  traZODone (DESYREL) 50 MG tablet TAKE 1 TABLET(50 MG) BY MOUTH AT BEDTIME AS NEEDED FOR SLEEP Patient taking differently: Take 50 mg by mouth at bedtime as needed for sleep.  06/24/18   Campbell Riches, MD  VOLTAREN 1 % GEL APPLY 2 GRAMS EXTERNALLY TO THE AFFECTED AREA FOUR TIMES DAILY Patient taking differently: Apply 2 g topically 4 (four) times daily as needed (pain).  05/09/18   Jean Rosenthal, MD    Family History Family History  Problem Relation Age of Onset  . Hypertension Mother   . Diabetes Mother   . Cancer Father     Social History Social History   Tobacco Use  . Smoking status: Former Smoker    Years: 10.00    Types: Cigarettes    Last attempt to quit: 08/08/2015    Years since quitting: 2.9  . Smokeless tobacco: Never Used  . Tobacco comment: casual smoking for 10 years  Substance Use Topics  . Alcohol use: No    Alcohol/week: 0.0 standard drinks    Comment: quit 2004  . Drug use: No    Comment: uds (+) cocaine in 08/2011 but pt states was taking sudafed at the time     Allergies   Lisinopril; Penicillins; Fentanyl; and Morphine and related   Review of Systems Review of Systems  Unable to perform ROS: Mental status change     Physical Exam Updated Vital Signs BP (!) 139/107   Pulse 89   Temp 98.3 F (36.8 C) (Oral)   Resp (!) 26   SpO2 95%   Physical Exam  Constitutional: He appears well-developed and well-nourished. No distress.  HENT:  Head: Normocephalic and atraumatic.  Right Ear:  External ear normal.  Left Ear: External ear normal.  Nose: Nose normal.  Eyes: Pupils are equal, round, and reactive to light.  Neck: Normal range of motion.  Cardiovascular: Normal rate and regular rhythm.  Pulmonary/Chest: Effort normal and breath sounds normal.  Abdominal: Soft. Bowel sounds are normal.  Musculoskeletal: Normal range of motion.  Neurological: He is alert.  Alert and is oriented to being in the hospital but is unable to tell me the name or the date  Skin: Skin is warm. Capillary refill takes less than 2 seconds.  Vitals reviewed.    ED Treatments / Results  Labs (all labs ordered are listed, but only abnormal results are displayed) Labs Reviewed  CBC  BASIC METABOLIC PANEL  CBG MONITORING, ED    EKG EKG Interpretation  Date/Time:  Saturday July 09 2018 21:37:06 EDT Ventricular Rate:  95 PR Interval:    QRS Duration: 112 QT Interval:  380 QTC Calculation: 478 R Axis:   86 Text Interpretation:  Normal sinus rhythm First degree A-V block Baseline wander Confirmed by Pattricia Boss [phone]) on 07/09/2018 10:24:21 PM   Radiology Dg Chest Port 1 View  Result Date: 07/09/2018 CLINICAL DATA:  Cough. EXAM: PORTABLE CHEST 1 VIEW COMPARISON:  Chest radiograph 07/04/2018. Additional prior chest radiographs reviewed. FINDINGS: Left pleural effusion and basilar opacity is unchanged from recent exam. Progressive peribronchial thickening. No new airspace disease. Unchanged heart size and mediastinal contours. No pneumothorax. IMPRESSION: 1. Increased peribronchial thickening most suggestive of progressive pulmonary edema, atypical infection felt less likely. 2. Unchanged left basilar opacity and pleural effusion from recent exam. Electronically Signed   By: Keith Rake M.D.   On: 07/09/2018 22:53    Procedures Procedures (including critical care time)  Medications Ordered in ED Medications - No  data to display   Initial Impression / Assessment and Plan /  ED Course  I have reviewed the triage vital signs and the nursing notes.  Pertinent labs & imaging results that were available during my care of the patient were reviewed by me and considered in my medical decision making (see chart for details).     62 yo male esrd, hiv, recent surgery av fistula revision, cataract surgery with increasing weakness and less responsive.  Tonight he had a seizure tonight and continues with decreased responsiveness hear.  No lateral deficits.  No fever.  CXR c.w. Volume overload, significantly elevated creatinine and acidosis.   Plan admission for further admission and evaluation. Will consult to nephrology for dialysis. Discussed with Dr. Maudie Mercury  Vitals:   07/09/18 2146 07/09/18 2156  BP:    Pulse:  88  Resp:  16  Temp: 98.9 F (37.2 C)   SpO2:  94%   Nephrology has not called back which I did not realize until after leaving hospital Discussed with Dr. Mariane Masters and he will contact admitting MD  Final Clinical Impressions(s) / ED Diagnoses   Final diagnoses:  Altered mental status, unspecified altered mental status type  Seizure ([place])  Elevated serum creatinine    ED Discharge Orders    None       Pattricia Boss, MD 07/10/18 0028

## 2018-07-09 NOTE — ED Notes (Signed)
Patient transported to CT 

## 2018-07-09 NOTE — ED Triage Notes (Signed)
Pt had a fistula placed to L arm on Monday. Since Monday, per brother, pt has been acting abnormal at home. Today had a witnessed seizure by brother, then had an episode of dry heaving. Pt postical on arrival

## 2018-07-10 ENCOUNTER — Other Ambulatory Visit: Payer: Self-pay

## 2018-07-10 ENCOUNTER — Inpatient Hospital Stay (HOSPITAL_COMMUNITY): Payer: Medicare Other

## 2018-07-10 ENCOUNTER — Encounter (HOSPITAL_COMMUNITY): Payer: Self-pay | Admitting: Internal Medicine

## 2018-07-10 ENCOUNTER — Inpatient Hospital Stay (HOSPITAL_COMMUNITY): Payer: Medicare Other | Admitting: Certified Registered"

## 2018-07-10 DIAGNOSIS — Z21 Asymptomatic human immunodeficiency virus [HIV] infection status: Secondary | ICD-10-CM | POA: Diagnosis not present

## 2018-07-10 DIAGNOSIS — N19 Unspecified kidney failure: Secondary | ICD-10-CM

## 2018-07-10 DIAGNOSIS — Z992 Dependence on renal dialysis: Secondary | ICD-10-CM | POA: Diagnosis not present

## 2018-07-10 DIAGNOSIS — R451 Restlessness and agitation: Secondary | ICD-10-CM | POA: Diagnosis present

## 2018-07-10 DIAGNOSIS — G40901 Epilepsy, unspecified, not intractable, with status epilepticus: Principal | ICD-10-CM

## 2018-07-10 DIAGNOSIS — I161 Hypertensive emergency: Secondary | ICD-10-CM

## 2018-07-10 DIAGNOSIS — R4182 Altered mental status, unspecified: Secondary | ICD-10-CM | POA: Diagnosis not present

## 2018-07-10 DIAGNOSIS — E8770 Fluid overload, unspecified: Secondary | ICD-10-CM | POA: Diagnosis present

## 2018-07-10 DIAGNOSIS — I639 Cerebral infarction, unspecified: Secondary | ICD-10-CM | POA: Diagnosis not present

## 2018-07-10 DIAGNOSIS — I428 Other cardiomyopathies: Secondary | ICD-10-CM | POA: Diagnosis present

## 2018-07-10 DIAGNOSIS — I6330 Cerebral infarction due to thrombosis of unspecified cerebral artery: Secondary | ICD-10-CM | POA: Diagnosis not present

## 2018-07-10 DIAGNOSIS — B181 Chronic viral hepatitis B without delta-agent: Secondary | ICD-10-CM | POA: Diagnosis present

## 2018-07-10 DIAGNOSIS — N186 End stage renal disease: Secondary | ICD-10-CM

## 2018-07-10 DIAGNOSIS — G9341 Metabolic encephalopathy: Secondary | ICD-10-CM | POA: Diagnosis not present

## 2018-07-10 DIAGNOSIS — G92 Toxic encephalopathy: Secondary | ICD-10-CM | POA: Diagnosis present

## 2018-07-10 DIAGNOSIS — R7989 Other specified abnormal findings of blood chemistry: Secondary | ICD-10-CM | POA: Diagnosis present

## 2018-07-10 DIAGNOSIS — D696 Thrombocytopenia, unspecified: Secondary | ICD-10-CM | POA: Diagnosis present

## 2018-07-10 DIAGNOSIS — I352 Nonrheumatic aortic (valve) stenosis with insufficiency: Secondary | ICD-10-CM | POA: Diagnosis present

## 2018-07-10 DIAGNOSIS — G40909 Epilepsy, unspecified, not intractable, without status epilepticus: Secondary | ICD-10-CM | POA: Diagnosis not present

## 2018-07-10 DIAGNOSIS — F419 Anxiety disorder, unspecified: Secondary | ICD-10-CM | POA: Diagnosis present

## 2018-07-10 DIAGNOSIS — K219 Gastro-esophageal reflux disease without esophagitis: Secondary | ICD-10-CM | POA: Diagnosis present

## 2018-07-10 DIAGNOSIS — Z23 Encounter for immunization: Secondary | ICD-10-CM | POA: Diagnosis not present

## 2018-07-10 DIAGNOSIS — J9601 Acute respiratory failure with hypoxia: Secondary | ICD-10-CM

## 2018-07-10 DIAGNOSIS — G40119 Localization-related (focal) (partial) symptomatic epilepsy and epileptic syndromes with simple partial seizures, intractable, without status epilepticus: Secondary | ICD-10-CM | POA: Diagnosis not present

## 2018-07-10 DIAGNOSIS — D631 Anemia in chronic kidney disease: Secondary | ICD-10-CM | POA: Diagnosis not present

## 2018-07-10 DIAGNOSIS — D638 Anemia in other chronic diseases classified elsewhere: Secondary | ICD-10-CM | POA: Diagnosis present

## 2018-07-10 DIAGNOSIS — Z8249 Family history of ischemic heart disease and other diseases of the circulatory system: Secondary | ICD-10-CM | POA: Diagnosis not present

## 2018-07-10 DIAGNOSIS — J9690 Respiratory failure, unspecified, unspecified whether with hypoxia or hypercapnia: Secondary | ICD-10-CM | POA: Diagnosis not present

## 2018-07-10 DIAGNOSIS — Z4682 Encounter for fitting and adjustment of non-vascular catheter: Secondary | ICD-10-CM | POA: Diagnosis not present

## 2018-07-10 DIAGNOSIS — I6349 Cerebral infarction due to embolism of other cerebral artery: Secondary | ICD-10-CM | POA: Diagnosis present

## 2018-07-10 DIAGNOSIS — I6340 Cerebral infarction due to embolism of unspecified cerebral artery: Secondary | ICD-10-CM | POA: Diagnosis present

## 2018-07-10 DIAGNOSIS — I2510 Atherosclerotic heart disease of native coronary artery without angina pectoris: Secondary | ICD-10-CM | POA: Diagnosis present

## 2018-07-10 DIAGNOSIS — I351 Nonrheumatic aortic (valve) insufficiency: Secondary | ICD-10-CM | POA: Diagnosis not present

## 2018-07-10 DIAGNOSIS — I120 Hypertensive chronic kidney disease with stage 5 chronic kidney disease or end stage renal disease: Secondary | ICD-10-CM | POA: Diagnosis present

## 2018-07-10 DIAGNOSIS — Z01810 Encounter for preprocedural cardiovascular examination: Secondary | ICD-10-CM | POA: Diagnosis not present

## 2018-07-10 DIAGNOSIS — R569 Unspecified convulsions: Secondary | ICD-10-CM | POA: Diagnosis present

## 2018-07-10 DIAGNOSIS — E440 Moderate protein-calorie malnutrition: Secondary | ICD-10-CM | POA: Diagnosis present

## 2018-07-10 DIAGNOSIS — I63412 Cerebral infarction due to embolism of left middle cerebral artery: Secondary | ICD-10-CM | POA: Diagnosis not present

## 2018-07-10 DIAGNOSIS — N2581 Secondary hyperparathyroidism of renal origin: Secondary | ICD-10-CM | POA: Diagnosis present

## 2018-07-10 DIAGNOSIS — I5030 Unspecified diastolic (congestive) heart failure: Secondary | ICD-10-CM | POA: Diagnosis not present

## 2018-07-10 DIAGNOSIS — Z452 Encounter for adjustment and management of vascular access device: Secondary | ICD-10-CM | POA: Diagnosis not present

## 2018-07-10 DIAGNOSIS — R4020 Unspecified coma: Secondary | ICD-10-CM | POA: Diagnosis not present

## 2018-07-10 DIAGNOSIS — G4733 Obstructive sleep apnea (adult) (pediatric): Secondary | ICD-10-CM | POA: Diagnosis present

## 2018-07-10 DIAGNOSIS — J90 Pleural effusion, not elsewhere classified: Secondary | ICD-10-CM | POA: Diagnosis not present

## 2018-07-10 LAB — GLUCOSE, CAPILLARY
Glucose-Capillary: 90 mg/dL (ref 70–99)
Glucose-Capillary: 86 mg/dL (ref 70–99)
Glucose-Capillary: 113 mg/dL — ABNORMAL HIGH (ref 70–99)

## 2018-07-10 LAB — RPR: RPR Ser Ql: NONREACTIVE

## 2018-07-10 LAB — BLOOD GAS, ARTERIAL
ACID-BASE DEFICIT: 15.1 mmol/L — AB (ref 0.0–2.0)
ACID-BASE DEFICIT: 5.5 mmol/L — AB (ref 0.0–2.0)
BICARBONATE: 12.1 mmol/L — AB (ref 20.0–28.0)
Bicarbonate: 19.3 mmol/L — ABNORMAL LOW (ref 20.0–28.0)
Drawn by: 24513
Drawn by: 313941
FIO2: 1
FIO2: 50
MECHVT: 620 mL
O2 SAT: 98.8 %
O2 Saturation: 99 %
PCO2 ART: 37 mmHg (ref 32.0–48.0)
PEEP/CPAP: 5 cmH2O
PH ART: 7.15 — AB (ref 7.350–7.450)
PO2 ART: 173 mmHg — AB (ref 83.0–108.0)
Patient temperature: 98.6
Patient temperature: 98.5
RATE: 16 resp/min
pCO2 arterial: 36.1 mmHg (ref 32.0–48.0)
pH, Arterial: 7.337 — ABNORMAL LOW (ref 7.350–7.450)
pO2, Arterial: 336 mmHg — ABNORMAL HIGH (ref 83.0–108.0)

## 2018-07-10 LAB — COMPREHENSIVE METABOLIC PANEL
ALT: 7 U/L (ref 0–44)
AST: 19 U/L (ref 15–41)
Albumin: 3.4 g/dL — ABNORMAL LOW (ref 3.5–5.0)
Alkaline Phosphatase: 62 U/L (ref 38–126)
Anion gap: 19 — ABNORMAL HIGH (ref 5–15)
BILIRUBIN TOTAL: 0.9 mg/dL (ref 0.3–1.2)
BUN: 131 mg/dL — AB (ref 8–23)
CO2: 14 mmol/L — ABNORMAL LOW (ref 22–32)
CREATININE: 21.5 mg/dL — AB (ref 0.61–1.24)
Calcium: 9.2 mg/dL (ref 8.9–10.3)
Chloride: 105 mmol/L (ref 98–111)
GFR, EST AFRICAN AMERICAN: 2 mL/min — AB (ref >60)
GFR, EST NON AFRICAN AMERICAN: 2 mL/min — AB (ref >60)
Glucose, Bld: 103 mg/dL — ABNORMAL HIGH (ref 70–99)
POTASSIUM: 5.1 mmol/L (ref 3.5–5.1)
Sodium: 138 mmol/L (ref 135–145)
TOTAL PROTEIN: 7.2 g/dL (ref 6.5–8.1)

## 2018-07-10 LAB — TSH: TSH: 4.286 u[IU]/mL (ref 0.350–4.500)

## 2018-07-10 LAB — BASIC METABOLIC PANEL
ANION GAP: 27 — AB (ref 5–15)
BUN: 102 mg/dL — ABNORMAL HIGH (ref 8–23)
CHLORIDE: 97 mmol/L — AB (ref 98–111)
CO2: 11 mmol/L — AB (ref 22–32)
CREATININE: 17.53 mg/dL — AB (ref 0.61–1.24)
Calcium: 8.8 mg/dL — ABNORMAL LOW (ref 8.9–10.3)
GFR calc non Af Amer: 2 mL/min — ABNORMAL LOW (ref >60)
GFR, EST AFRICAN AMERICAN: 3 mL/min — AB (ref >60)
Glucose, Bld: 126 mg/dL — ABNORMAL HIGH (ref 70–99)
POTASSIUM: 4.3 mmol/L (ref 3.5–5.1)
SODIUM: 135 mmol/L (ref 135–145)

## 2018-07-10 LAB — CBC
HCT: 36.1 % — ABNORMAL LOW (ref 39.0–52.0)
HEMATOCRIT: 36.4 % — AB (ref 39.0–52.0)
Hemoglobin: 11.2 g/dL — ABNORMAL LOW (ref 13.0–17.0)
Hemoglobin: 11.4 g/dL — ABNORMAL LOW (ref 13.0–17.0)
MCH: 28.2 pg (ref 26.0–34.0)
MCH: 28.9 pg (ref 26.0–34.0)
MCHC: 31 g/dL (ref 30.0–36.0)
MCHC: 31.3 g/dL (ref 30.0–36.0)
MCV: 90.9 fL (ref 80.0–100.0)
MCV: 92.4 fL (ref 80.0–100.0)
NRBC: 0 % (ref 0.0–0.2)
Platelets: 174 10*3/uL (ref 150–400)
Platelets: 160 10*3/uL (ref 150–400)
RBC: 3.97 MIL/uL — ABNORMAL LOW (ref 4.22–5.81)
RBC: 3.94 MIL/uL — AB (ref 4.22–5.81)
RDW: 17 % — AB (ref 11.5–15.5)
RDW: 17.3 % — AB (ref 11.5–15.5)
WBC: 11.3 10*3/uL — AB (ref 4.0–10.5)
WBC: 11.7 10*3/uL — ABNORMAL HIGH (ref 4.0–10.5)
nRBC: 0 % (ref 0.0–0.2)

## 2018-07-10 LAB — TROPONIN I: Troponin I: 0.07 ng/mL (ref <0.03)

## 2018-07-10 LAB — LACTIC ACID, PLASMA
Lactic Acid, Venous: 1.6 mmol/L (ref 0.5–1.9)
Lactic Acid, Venous: 4.7 mmol/L (ref 0.5–1.9)

## 2018-07-10 LAB — VITAMIN B12: VITAMIN B 12: 393 pg/mL (ref 180–914)

## 2018-07-10 LAB — PROCALCITONIN: PROCALCITONIN: 0.89 ng/mL

## 2018-07-10 LAB — TRIGLYCERIDES: Triglycerides: 145 mg/dL

## 2018-07-10 LAB — SEDIMENTATION RATE: Sed Rate: 13 mm/hr (ref 0–16)

## 2018-07-10 LAB — AMMONIA
AMMONIA: 38 umol/L — AB (ref 9–35)
AMMONIA: 40 umol/L — AB (ref 9–35)

## 2018-07-10 MED ORDER — SODIUM CHLORIDE 0.9 % FOR CRRT
INTRAVENOUS_CENTRAL | Status: DC | PRN
  Filled 2018-07-10: qty 1000

## 2018-07-10 MED ORDER — ALTEPLASE 2 MG IJ SOLR
2.0000 mg | Freq: Once | INTRAMUSCULAR | Status: DC | PRN
  Filled 2018-07-10: qty 2

## 2018-07-10 MED ORDER — ALBUTEROL SULFATE (2.5 MG/3ML) 0.083% IN NEBU: 2.5000 mg | INHALATION_SOLUTION | Freq: Four times a day (QID) | RESPIRATORY_TRACT | Status: DC | PRN

## 2018-07-10 MED ORDER — MIDAZOLAM HCL 2 MG/2ML IJ SOLN: 2.0000 mg | INTRAMUSCULAR | Status: DC | PRN

## 2018-07-10 MED ORDER — ETOMIDATE 2 MG/ML IV SOLN
INTRAVENOUS | Status: AC | PRN
  Administered 2018-07-10: 16 mg via INTRAVENOUS

## 2018-07-10 MED ORDER — LORAZEPAM 2 MG/ML IJ SOLN
1.0000 mg | INTRAMUSCULAR | Status: DC | PRN
  Administered 2018-07-10 (×2): 1 mg via INTRAVENOUS
  Filled 2018-07-10 (×4): qty 1

## 2018-07-10 MED ORDER — LEVETIRACETAM IN NACL 1500 MG/100ML IV SOLN
1500.0000 mg | Freq: Once | INTRAVENOUS | Status: AC
  Administered 2018-07-11: 1500 mg via INTRAVENOUS
  Filled 2018-07-10 (×2): qty 100

## 2018-07-10 MED ORDER — SODIUM CHLORIDE 0.9 % IV SOLN: 100.0000 mL | INTRAVENOUS | Status: DC | PRN

## 2018-07-10 MED ORDER — HYDROXYZINE HCL 25 MG PO TABS: 25.0000 mg | ORAL_TABLET | Freq: Four times a day (QID) | ORAL | Status: DC | PRN

## 2018-07-10 MED ORDER — FENTANYL 2500MCG IN NS 250ML (10MCG/ML) PREMIX INFUSION
0.0000 ug/h | INTRAVENOUS | Status: DC
  Administered 2018-07-10: 50 ug/h via INTRAVENOUS
  Administered 2018-07-11: 200 ug/h via INTRAVENOUS
  Filled 2018-07-10 (×2): qty 250

## 2018-07-10 MED ORDER — PRISMASOL BGK 4/2.5 32-4-2.5 MEQ/L IV SOLN
INTRAVENOUS | Status: DC
  Administered 2018-07-11 (×2): via INTRAVENOUS_CENTRAL
  Filled 2018-07-10 (×5): qty 5000

## 2018-07-10 MED ORDER — RENA-VITE PO TABS
1.0000 | ORAL_TABLET | Freq: Every day | ORAL | Status: DC
  Administered 2018-07-11 – 2018-07-19 (×9): 1 via ORAL
  Filled 2018-07-10 (×9): qty 1

## 2018-07-10 MED ORDER — DOCUSATE SODIUM 50 MG/5ML PO LIQD: 100.0000 mg | Freq: Two times a day (BID) | ORAL | Status: DC | PRN

## 2018-07-10 MED ORDER — SUCCINYLCHOLINE CHLORIDE 20 MG/ML IJ SOLN: INTRAMUSCULAR | Status: DC | PRN

## 2018-07-10 MED ORDER — PROPOFOL 500 MG/50ML IV EMUL
INTRAVENOUS | Status: AC
  Filled 2018-07-10: qty 50

## 2018-07-10 MED ORDER — CALCIUM ACETATE (PHOS BINDER) 667 MG PO CAPS
2001.0000 mg | ORAL_CAPSULE | Freq: Three times a day (TID) | ORAL | Status: DC
  Administered 2018-07-12: 2001 mg via ORAL
  Filled 2018-07-10: qty 3

## 2018-07-10 MED ORDER — SODIUM CHLORIDE 0.9 % IV SOLN: 250.0000 mL | INTRAVENOUS | Status: DC | PRN

## 2018-07-10 MED ORDER — FAMOTIDINE 40 MG/5ML PO SUSR
20.0000 mg | Freq: Two times a day (BID) | ORAL | Status: DC
  Administered 2018-07-11: 20 mg
  Filled 2018-07-10: qty 2.5

## 2018-07-10 MED ORDER — LIDOCAINE HCL (PF) 1 % IJ SOLN: 5.0000 mL | INTRAMUSCULAR | Status: DC | PRN

## 2018-07-10 MED ORDER — FENTANYL CITRATE (PF) 100 MCG/2ML IJ SOLN
INTRAMUSCULAR | Status: AC
  Filled 2018-07-10: qty 2

## 2018-07-10 MED ORDER — SODIUM CHLORIDE 0.9% FLUSH: 3.0000 mL | INTRAVENOUS | Status: DC | PRN

## 2018-07-10 MED ORDER — CALCIUM ACETATE (PHOS BINDER) 667 MG PO CAPS: 1334.0000 mg | ORAL_CAPSULE | Freq: Two times a day (BID) | ORAL | Status: DC | PRN

## 2018-07-10 MED ORDER — MIDAZOLAM HCL 2 MG/2ML IJ SOLN
INTRAMUSCULAR | Status: AC
  Administered 2018-07-10: 2 mg
  Filled 2018-07-10: qty 2

## 2018-07-10 MED ORDER — HEPARIN SODIUM (PORCINE) 1000 UNIT/ML DIALYSIS
1000.0000 [IU] | INTRAMUSCULAR | Status: DC | PRN
  Administered 2018-07-12: 3000 [IU] via INTRAVENOUS_CENTRAL
  Filled 2018-07-10: qty 3
  Filled 2018-07-10 (×2): qty 6

## 2018-07-10 MED ORDER — HYDRALAZINE HCL 20 MG/ML IJ SOLN
10.0000 mg | INTRAMUSCULAR | Status: DC | PRN
  Administered 2018-07-10 – 2018-07-16 (×3): 10 mg via INTRAVENOUS
  Filled 2018-07-10 (×2): qty 1

## 2018-07-10 MED ORDER — MIDAZOLAM HCL 2 MG/2ML IJ SOLN
INTRAMUSCULAR | Status: AC
  Filled 2018-07-10: qty 2

## 2018-07-10 MED ORDER — LIDOCAINE-PRILOCAINE 2.5-2.5 % EX CREA
1.0000 "application " | TOPICAL_CREAM | CUTANEOUS | Status: DC | PRN
  Filled 2018-07-10: qty 5

## 2018-07-10 MED ORDER — HYDRALAZINE HCL 20 MG/ML IJ SOLN
10.0000 mg | Freq: Four times a day (QID) | INTRAMUSCULAR | Status: DC | PRN
  Administered 2018-07-10: 10 mg via INTRAVENOUS
  Filled 2018-07-10 (×2): qty 1

## 2018-07-10 MED ORDER — INSULIN ASPART 100 UNIT/ML ~~LOC~~ SOLN
1.0000 [IU] | SUBCUTANEOUS | Status: DC
  Administered 2018-07-12 – 2018-07-13 (×6): 1 [IU] via SUBCUTANEOUS
  Administered 2018-07-13: 2 [IU] via SUBCUTANEOUS
  Administered 2018-07-14 – 2018-07-15 (×4): 1 [IU] via SUBCUTANEOUS
  Administered 2018-07-16: 2 [IU] via SUBCUTANEOUS
  Administered 2018-07-18 (×3): 1 [IU] via SUBCUTANEOUS

## 2018-07-10 MED ORDER — CARVEDILOL 12.5 MG PO TABS
25.0000 mg | ORAL_TABLET | Freq: Two times a day (BID) | ORAL | Status: DC
  Administered 2018-07-10 – 2018-07-19 (×17): 25 mg via ORAL
  Filled 2018-07-10 (×10): qty 1
  Filled 2018-07-10: qty 2
  Filled 2018-07-10 (×6): qty 1

## 2018-07-10 MED ORDER — NITROGLYCERIN IN D5W 200-5 MCG/ML-% IV SOLN
0.0000 ug/min | INTRAVENOUS | Status: DC
  Administered 2018-07-10: 10 ug/min via INTRAVENOUS
  Administered 2018-07-10: 5 ug/min via INTRAVENOUS
  Administered 2018-07-11: 55 ug/min via INTRAVENOUS
  Administered 2018-07-12: 35 ug/min via INTRAVENOUS
  Administered 2018-07-13: 45 ug/min via INTRAVENOUS
  Filled 2018-07-10 (×5): qty 250

## 2018-07-10 MED ORDER — MIDAZOLAM HCL 2 MG/2ML IJ SOLN
2.0000 mg | INTRAMUSCULAR | Status: DC | PRN
  Administered 2018-07-13 (×3): 2 mg via INTRAVENOUS
  Filled 2018-07-10 (×4): qty 2

## 2018-07-10 MED ORDER — LABETALOL HCL 5 MG/ML IV SOLN
10.0000 mg | INTRAVENOUS | Status: DC | PRN
  Administered 2018-07-10 – 2018-07-18 (×5): 10 mg via INTRAVENOUS
  Filled 2018-07-10 (×6): qty 4

## 2018-07-10 MED ORDER — LACOSAMIDE 50 MG PO TABS
100.0000 mg | ORAL_TABLET | Freq: Two times a day (BID) | ORAL | Status: DC
  Administered 2018-07-10 (×2): 100 mg via ORAL
  Filled 2018-07-10 (×2): qty 2

## 2018-07-10 MED ORDER — CLONIDINE HCL 0.1 MG PO TABS: 0.1000 mg | ORAL_TABLET | Freq: Once | ORAL | Status: DC

## 2018-07-10 MED ORDER — ROCURONIUM BROMIDE 10 MG/ML (PF) SYRINGE
PREFILLED_SYRINGE | INTRAVENOUS | Status: AC | PRN
  Administered 2018-07-10: 50 mg via INTRAVENOUS

## 2018-07-10 MED ORDER — CHLORHEXIDINE GLUCONATE 0.12% ORAL RINSE (MEDLINE KIT)
15.0000 mL | Freq: Two times a day (BID) | OROMUCOSAL | Status: DC
  Administered 2018-07-11 – 2018-07-14 (×7): 15 mL via OROMUCOSAL

## 2018-07-10 MED ORDER — ACETAMINOPHEN 325 MG PO TABS
650.0000 mg | ORAL_TABLET | Freq: Four times a day (QID) | ORAL | Status: DC | PRN
  Administered 2018-07-19: 650 mg via ORAL
  Filled 2018-07-10: qty 2

## 2018-07-10 MED ORDER — MIDAZOLAM HCL 2 MG/2ML IJ SOLN
INTRAMUSCULAR | Status: AC
  Administered 2018-07-10: 21:00:00
  Filled 2018-07-10: qty 2

## 2018-07-10 MED ORDER — FENTANYL CITRATE (PF) 100 MCG/2ML IJ SOLN
100.0000 ug | INTRAMUSCULAR | Status: DC | PRN
  Administered 2018-07-13: 100 ug via INTRAVENOUS
  Filled 2018-07-10: qty 2

## 2018-07-10 MED ORDER — HEPARIN SODIUM (PORCINE) 5000 UNIT/ML IJ SOLN
5000.0000 [IU] | Freq: Three times a day (TID) | INTRAMUSCULAR | Status: DC
  Administered 2018-07-10 – 2018-07-13 (×8): 5000 [IU] via SUBCUTANEOUS
  Filled 2018-07-10 (×8): qty 1

## 2018-07-10 MED ORDER — ONDANSETRON HCL 4 MG/2ML IJ SOLN
4.0000 mg | Freq: Four times a day (QID) | INTRAMUSCULAR | Status: DC | PRN
  Administered 2018-07-14: 4 mg via INTRAVENOUS
  Filled 2018-07-10: qty 2

## 2018-07-10 MED ORDER — BICTEGRAVIR-EMTRICITAB-TENOFOV 50-200-25 MG PO TABS: 1.0000 | ORAL_TABLET | Freq: Every day | ORAL | Status: DC

## 2018-07-10 MED ORDER — BICTEGRAVIR-EMTRICITAB-TENOFOV 50-200-25 MG PO TABS
1.0000 | ORAL_TABLET | Freq: Every day | ORAL | Status: DC
  Administered 2018-07-11 – 2018-07-19 (×9): 1 via ORAL
  Filled 2018-07-10 (×11): qty 1

## 2018-07-10 MED ORDER — HYDRALAZINE HCL 50 MG PO TABS
100.0000 mg | ORAL_TABLET | Freq: Three times a day (TID) | ORAL | Status: DC
  Administered 2018-07-10 – 2018-07-19 (×24): 100 mg via ORAL
  Filled 2018-07-10 (×25): qty 2

## 2018-07-10 MED ORDER — KETOROLAC TROMETHAMINE 0.5 % OP SOLN
1.0000 [drp] | Freq: Four times a day (QID) | OPHTHALMIC | Status: DC
  Administered 2018-07-10 – 2018-07-18 (×33): 1 [drp] via OPHTHALMIC
  Filled 2018-07-10 (×3): qty 5

## 2018-07-10 MED ORDER — ACETAMINOPHEN 650 MG RE SUPP: 650.0000 mg | Freq: Four times a day (QID) | RECTAL | Status: DC | PRN

## 2018-07-10 MED ORDER — CHLORHEXIDINE GLUCONATE CLOTH 2 % EX PADS
6.0000 | MEDICATED_PAD | Freq: Every day | CUTANEOUS | Status: DC
  Administered 2018-07-10: 6 via TOPICAL

## 2018-07-10 MED ORDER — PRISMASOL BGK 4/2.5 32-4-2.5 MEQ/L IV SOLN
INTRAVENOUS | Status: DC
  Administered 2018-07-11: 19:00:00 via INTRAVENOUS_CENTRAL
  Filled 2018-07-10 (×3): qty 5000

## 2018-07-10 MED ORDER — SODIUM BICARBONATE 8.4 % IV SOLN
50.0000 meq | Freq: Once | INTRAVENOUS | Status: AC
  Administered 2018-07-10: 50 meq via INTRAVENOUS

## 2018-07-10 MED ORDER — FENTANYL CITRATE (PF) 100 MCG/2ML IJ SOLN
100.0000 ug | INTRAMUSCULAR | Status: DC | PRN
  Administered 2018-07-14: 100 ug via INTRAVENOUS
  Filled 2018-07-10: qty 2

## 2018-07-10 MED ORDER — PREDNISOLONE ACETATE 1 % OP SUSP
1.0000 [drp] | Freq: Four times a day (QID) | OPHTHALMIC | Status: DC
  Administered 2018-07-10 – 2018-07-18 (×33): 1 [drp] via OPHTHALMIC
  Filled 2018-07-10 (×3): qty 5

## 2018-07-10 MED ORDER — LABETALOL HCL 5 MG/ML IV SOLN
10.0000 mg | Freq: Once | INTRAVENOUS | Status: AC
  Administered 2018-07-10: 10 mg via INTRAVENOUS
  Filled 2018-07-10: qty 4

## 2018-07-10 MED ORDER — AMLODIPINE BESYLATE 5 MG PO TABS
5.0000 mg | ORAL_TABLET | Freq: Every day | ORAL | Status: DC
  Administered 2018-07-10 – 2018-07-12 (×3): 5 mg via ORAL
  Filled 2018-07-10 (×4): qty 1

## 2018-07-10 MED ORDER — ONDANSETRON HCL 4 MG/2ML IJ SOLN
4.0000 mg | Freq: Once | INTRAMUSCULAR | Status: AC
  Administered 2018-07-10: 4 mg via INTRAVENOUS
  Filled 2018-07-10: qty 2

## 2018-07-10 MED ORDER — PRISMASOL BGK 4/2.5 32-4-2.5 MEQ/L IV SOLN
INTRAVENOUS | Status: DC
  Administered 2018-07-11 – 2018-07-12 (×10): via INTRAVENOUS_CENTRAL
  Filled 2018-07-10 (×17): qty 5000

## 2018-07-10 MED ORDER — LABETALOL HCL 5 MG/ML IV SOLN
20.0000 mg | Freq: Once | INTRAVENOUS | Status: AC
  Administered 2018-07-10: 20 mg via INTRAVENOUS
  Filled 2018-07-10: qty 4

## 2018-07-10 MED ORDER — SODIUM CHLORIDE 0.9% FLUSH
3.0000 mL | Freq: Two times a day (BID) | INTRAVENOUS | Status: DC
  Administered 2018-07-10 – 2018-07-18 (×11): 3 mL via INTRAVENOUS

## 2018-07-10 MED ORDER — ORAL CARE MOUTH RINSE
15.0000 mL | OROMUCOSAL | Status: DC
  Administered 2018-07-11 – 2018-07-14 (×34): 15 mL via OROMUCOSAL

## 2018-07-10 MED ORDER — PANTOPRAZOLE SODIUM 40 MG PO TBEC: 40.0000 mg | DELAYED_RELEASE_TABLET | Freq: Every day | ORAL | Status: DC

## 2018-07-10 MED ORDER — CYCLOBENZAPRINE HCL 10 MG PO TABS: 10.0000 mg | ORAL_TABLET | Freq: Every evening | ORAL | Status: DC | PRN

## 2018-07-10 MED ORDER — PENTAFLUOROPROP-TETRAFLUOROETH EX AERO: 1.0000 "application " | INHALATION_SPRAY | CUTANEOUS | Status: DC | PRN

## 2018-07-10 MED ORDER — PROPOFOL 1000 MG/100ML IV EMUL
5.0000 ug/kg/min | INTRAVENOUS | Status: DC
  Administered 2018-07-10: 20 ug/kg/min via INTRAVENOUS
  Filled 2018-07-10 (×2): qty 100

## 2018-07-10 MED ORDER — TRAZODONE HCL 50 MG PO TABS: 50.0000 mg | ORAL_TABLET | Freq: Every evening | ORAL | Status: DC | PRN

## 2018-07-10 MED ORDER — OFLOXACIN 0.3 % OP SOLN
1.0000 [drp] | Freq: Four times a day (QID) | OPHTHALMIC | Status: DC
  Administered 2018-07-10 – 2018-07-18 (×33): 1 [drp] via OPHTHALMIC
  Filled 2018-07-10 (×3): qty 5

## 2018-07-10 MED ORDER — HEPARIN SODIUM (PORCINE) 5000 UNIT/ML IJ SOLN: 5000.0000 [IU] | Freq: Three times a day (TID) | INTRAMUSCULAR | Status: DC

## 2018-07-10 NOTE — Significant Event (Addendum)
Rapid Response Event Note  Overview: Neurologic - Seizure  Initial Focused Assessment: Called by 24M RNs to come the bedside urgently. Upon arrival, staff informed me that patient had a seizure (jerking of the entire body from the neck down, + incontinence, + vomiting as well). Patient was his side, when I arrived. At first, patient was drowsy, awakes to painful stimuli, verbal - minimal talking, oriented to self, but after 2-3 minutes, patient was more responsive, follows commands, verbal - says a few words. Right pupil reactive, L pupil - reactive/recent cataract surgery. All VS stable except BP, SBP > 220 via manual cuff BP, auto BPs were not being read. Lungs sounds were diminished, good air movement bilaterally.  Interventions: - Seizure - Ativan 1mg  IV, scheduled meds also given - N/V - Zofran 4mg  IV - HTN - Hydralazine 10mg  IV  Plan of Care: - RN to check BP in about 30 mins.  - Repeat BP at 215 was high, SBP > 210 (manually) and SBP > 230 (auto-RLE). - RN received order for Labetalol 10 mg IV x 1 - RN called at 0403, patient still hypertensive, RN will treat and if this does not work, if ask for SDU bed. I agree with this plan, SDU is being held if patient needs it. - 0500, RN called me, felt that patient needed to moved and that MD had ordered more medications. I was with another patient, I instructed the RN to follow MD orders.  - I called the RN at 609, no answer, per reviewing chart, MD came and saw the patient, plan to transfer to SDU (6E) and start NTG drip.   Event Summary:  Call Time: 0016 Arrival Time: 0018 End Time: 0230  Jeffrey [name] R

## 2018-07-10 NOTE — Progress Notes (Signed)
New Admission Note:  Arrival Method: By bed from ED around 0000 Mental Orientation: Alert  Telemetry: Box 13, CCMD notified Assessment: Completed Skin: Completed, refer to flowsheets IV: Right forearm Pain: Denies Tubes: None Safety Measures: Safety Fall Prevention Plan was given, discussed  Admission: Completed [facility] Orientation: Patient has been orientated to the room, unit and the staff. Family: Brother at bedside  Orders have been reviewed and implemented. Will continue to monitor the patient. Call light has been placed within reach and bed alarm has been activated.   Refer to additional note for seizure activity during transfer from ED to unit.   Filley Mount, RN  Phone Number: [phone]

## 2018-07-10 NOTE — Consult Note (Addendum)
[facility] Renal Consultation Note    Indication for Consultation:  Management of ESRD/hemodialysis, anemia, hypertension/volume, and secondary hyperparathyroidism. PCP:  HPI: Jeffrey [name] is a 62 y.o. male with ESRD (on home HD), Hep B, HTN, HIV, Hx seizure disorder (over past 5 mo only), and asthma who was admitted with AMS and seizures.   Pt altered and restless. Hx from brother and notes. Last dialyzed on Saturday 10/12. He underwent revision of his L AVF on 10/14 by Dr. Scot Dock ([facility]). Ended up being admitted overnight d/t HTN urgency after procedure. Since that time, has been intermittently altered and restless to the point that he was unable to get his dialysis treatments at home. On Thurs, 10/16 he underwent cataract removal which apparently went ok. On 10/19, he had a tonic-clonic seizure at home which was witnessed by his brother prompting ED evaluation.   ED intake labs showed: Na 137, K 4.8, CO2 14, BUN 127, Cr 21.3, Ca 9.4, WBC 11.6, Hgb 12.5, LA 1.6. CXR showed mild pulmonary edema. Head CT negative for acute process. Moved to room, but then had another seizure in early morning of 10/20. MRI brain order pending. He is unable to give any ROS.  Home hemodialysis patient, uses L AVF with buttonholes. Last HD 10/12, per brother.  Past Medical History:  Diagnosis Date  . Anemia   . Anxiety   . Aortic valve stenosis   . Arthritis   . Asthma    per pt hx  . Dyspnea   . End stage renal disease on home HD 07/10/2011   Started HD in September 2012 at [facility] with a tunneled HD catheter, now on home HD with [facility]. Dialyzing through AVF L lower arm with buttonhole technique as of mid 2014. His brother does the HD treatments at home.  They are roommates for 23 years.  The brother works 3rd shift and gets off about 8am and then puts Mr Jeffrey [name] on HD in the morning after getting home. Most of the time he does HD about 4 times a week, for about 4 hours per treatment. Cause of ESRD  was HTN according to patient. He says he let his health go and ending up with complications, and that he didn't like seeing doctors in those days.  He says he was diagnosed with severe HTN when he lived in [place] in his 51's.   . Hepatitis    Hepatitis B  . Hepatitis B carrier ([place])   . HIV infection ([place])   . Hypertension   . Hyperthyroidism    normal now  . Pneumonia several yrs ago  . Seizure ([place])   . Seizures ([place]) 06/02/2011   had a mild one in Sept. 2019  . Sleep apnea    to start Cpap soon  . Thrombocytopenia ([place])    Past Surgical History:  Procedure Laterality Date  . AV FISTULA PLACEMENT  06/02/11   Left radiocephalic AVF  . BIOPSY  02/17/2018   Procedure: BIOPSY;  Surgeon: Milus Banister, MD;  Location: [facility] ENDOSCOPY;  Service: Endoscopy;;  . COLONOSCOPY    . COLONOSCOPY WITH PROPOFOL N/A 01/27/2018   Procedure: COLONOSCOPY WITH PROPOFOL;  Surgeon: [name], MD;  Location: [facility] ENDOSCOPY;  Service: Gastroenterology;  Laterality: N/A;  . CORONARY ANGIOPLASTY    . ERCP  03/21/2018   AT [place]  . ESOPHAGOGASTRODUODENOSCOPY N/A 02/17/2018   Procedure: ESOPHAGOGASTRODUODENOSCOPY (EGD);  Surgeon: Milus Banister, MD;  Location: [facility] ENDOSCOPY;  Service: Endoscopy;  Laterality: N/A;  . ESOPHAGOGASTRODUODENOSCOPY (EGD) WITH PROPOFOL N/A 01/27/2018   Procedure: ESOPHAGOGASTRODUODENOSCOPY (EGD) WITH PROPOFOL;  Surgeon: [name], MD;  Location: [facility] ENDOSCOPY;  Service: Gastroenterology;  Laterality: N/A;  . ESOPHAGOGASTRODUODENOSCOPY (EGD) WITH PROPOFOL N/A 04/03/2018   Procedure: ESOPHAGOGASTRODUODENOSCOPY (EGD) WITH PROPOFOL;  Surgeon: Clarene Essex, MD;  Location: [place];  Service: Endoscopy;  Laterality: N/A;  . EUS N/A 02/17/2018   Procedure: UPPER ENDOSCOPIC ULTRASOUND (EUS) RADIAL;  Surgeon: Milus Banister, MD;  Location: [facility] ENDOSCOPY;  Service: Endoscopy;  Laterality: N/A;  . fistulaogram     x 2 last 2 years  . IR DIALY SHUNT INTRO NEEDLE/INTRACATH  INITIAL W/IMG LEFT Left 04/20/2018  . IR FLUORO GUIDE CV LINE RIGHT  09/16/2017  . IR US GUIDE VASC ACCESS RIGHT  09/16/2017  . IRRIGATION AND DEBRIDEMENT KNEE Left 09/15/2017   Procedure: IRRIGATION AND DEBRIDEMENT KNEE; arthroscopic clean out;  Surgeon: [name], MD;  Location: [facility] ORS;  Service: Orthopedics;  Laterality: Left;  . OTHER SURGICAL HISTORY     removal temporary HD catheter   . REVISON OF ARTERIOVENOUS FISTULA Left 10/10/2015   Procedure: REVISON OF LEFT RADIOCEPHALIC ARTERIOVENOUS FISTULA;  Surgeon: Angelia Mould, MD;  Location: [place];  Service: Vascular;  Laterality: Left;  . REVISON OF ARTERIOVENOUS FISTULA Left 02/07/2016   Procedure: REPAIR OF PSEUDO-ANEUREYSM OF LEFT ARM  ARTERIOVENOUS FISTULA;  Surgeon: Angelia Mould, MD;  Location: [place];  Service: Vascular;  Laterality: Left;  . REVISON OF ARTERIOVENOUS FISTULA Left 9/62/2297   Procedure: PLICATION OF LEFT ARM RADIOCEPHALIC ARTERIOVENOUS FISTULA PSEUDOANEURYSM;  Surgeon: Angelia Mould, MD;  Location: [place];  Service: Vascular;  Laterality: Left;  . REVISON OF ARTERIOVENOUS FISTULA Left 07/04/2018   Procedure: REVISION OF ARTERIOVENOUS RADIOCEPHALIC FISTULA;  Surgeon: Angelia Mould, MD;  Location: [place];  Service: Vascular;  Laterality: Left;  . RIGHT/LEFT HEART CATH AND CORONARY ANGIOGRAPHY N/A 02/17/2017   Procedure: Right/Left Heart Cath and Coronary Angiography;  Surgeon: Sherren Mocha, MD;  Location: [place] CV LAB;  Service: Cardiovascular;  Laterality: N/A;  . TEE WITHOUT CARDIOVERSION N/A 09/11/2016   Procedure: TRANSESOPHAGEAL ECHOCARDIOGRAM (TEE);  Surgeon: Dorothy Spark, MD;  Location: [place];  Service: Cardiovascular;  Laterality: N/A;  . TEE WITHOUT CARDIOVERSION N/A 05/05/2018   Procedure: TRANSESOPHAGEAL ECHOCARDIOGRAM (TEE);  Surgeon: Adrian Prows, MD;  Location: [facility] ENDOSCOPY;  Service: Cardiovascular;  Laterality: N/A;  Prefer after 3:30 PM   Family History   Problem Relation Age of Onset  . Hypertension Mother   . Diabetes Mother   . Cancer Father    Social History:  reports that he quit smoking about 2 years ago. His smoking use included cigarettes. He quit after 10.00 years of use. He has never used smokeless tobacco. He reports that he does not drink alcohol or use drugs.  ROS: As per HPI otherwise negative. Unable to obtain from patient.  Physical Exam: Vitals:   07/10/18 0452 07/10/18 0703 07/10/18 0805 07/10/18 0816  BP: (!) 230/92 (!) 130/95 (!) 128/100 (!) 170/100  Pulse:  81  80  Resp:    (!) 25  Temp:      TempSrc:      SpO2:    96%  Weight:      Height:         General: Confused man. Restless in bed. Not answering questions. Head: Normocephalic, atraumatic. Neck: Supple without lymphadenopathy/masses. JVD not elevated. Lungs: Clear bilaterally to auscultation without wheezes, rales, or rhonchi. Breathing is  unlabored. Heart: RRR with normal S1, S2. No murmurs, rubs, or gallops appreciated. Abdomen: Soft, non-tender, non-distended with normoactive bowel sounds. No rebound/guarding. Musculoskeletal:  Strength and tone appear normal for age. Lower extremities: No edema or ischemic changes, no open wounds. Neuro: Awake, confused. Dialysis Access: L forearm AVF + thrill  Allergies  Allergen Reactions  . Lisinopril Anaphylaxis and Shortness Of Breath    Throat swelling  . Penicillins Anaphylaxis and Other (See Comments)    Childhood allergy Has patient had a PCN reaction causing immediate rash, facial/tongue/throat swelling, SOB or lightheadedness with hypotension: Yes Has patient had a PCN reaction causing severe rash involving mucus membranes or skin necrosis: Unk Has patient had a PCN reaction that required hospitalization: Unk Has patient had a PCN reaction occurring within the last 10 years: No If all of the above answers are "NO", then may proceed with Cephalosporin use.   . Fentanyl Other (See Comments)     Lethargy, AMS  . Morphine And Related Other (See Comments)    "Not Himself"   Prior to Admission medications   Medication Sig Start Date End Date Taking? Authorizing Provider  albuterol (PROVENTIL) (2.5 MG/3ML) 0.083% nebulizer solution Take 2.5 mg by nebulization every 6 (six) hours as needed for wheezing or shortness of breath.   Yes [provider]  ALPRAZolam (XANAX) 0.25 MG tablet Take 0.25 mg by mouth at bedtime as needed for anxiety.    Yes [provider]  amLODipine (NORVASC) 5 MG tablet Take 1 tablet (5 mg total) by mouth daily. 07/05/18 07/05/19 Yes Regalado, Belkys A, MD  bictegravir-emtricitabine-tenofovir AF (BIKTARVY) 50-200-25 MG TABS tablet Take 1 tablet by mouth daily. 12/27/17  Yes Campbell Riches, MD  calcium acetate (PHOSLO) 667 MG capsule Take 1,334-2,001 mg by mouth See admin instructions. Take 2,001 mg three times a day with each meal and 1,334 mg with each snack 06/06/15  Yes [provider]  carvedilol (COREG) 25 MG tablet Take 1 tablet (25 mg total) by mouth 2 (two) times daily with a meal. 06/20/18  Yes Velna Ochs, MD  cloNIDine (CATAPRES - DOSED IN MG/24 HR) 0.3 mg/24hr patch Place 1 patch (0.3 mg total) onto the skin once a week. 06/20/18  Yes Velna Ochs, MD  cyclobenzaprine (FLEXERIL) 10 MG tablet Take 1 tablet (10 mg total) by mouth at bedtime as needed for muscle spasms. 06/14/18  Yes Candice Camp, MD  epoetin alfa (EPOGEN,PROCRIT) 2000 UNIT/ML injection Inject 2,000 Units into the vein 3 (three) times a week.    Yes [provider]  ethyl chloride spray Apply 1 application topically daily as needed (HD- to numb).  05/20/15  Yes [provider]  heparin 1000 UNIT/ML injection Inject 3,000 Units into the vein 4 (four) times a week.    Yes [provider]  hydrALAZINE (APRESOLINE) 100 MG tablet TAKE 1 TABLET(100 MG) BY MOUTH THREE TIMES DAILY Patient taking differently: Take 100 mg by mouth 3 (three)  times daily.  06/13/18  Yes Aldine Contes, MD  hydrOXYzine (ATARAX/VISTARIL) 25 MG tablet Take 1 tablet (25 mg total) by mouth 4 (four) times daily as needed for itching. 06/20/18  Yes Velna Ochs, MD  Ipratropium-Albuterol (COMBIVENT IN) Inhale 2 puffs into the lungs every 6 (six) hours as needed (as needed for shortness of breath).    Yes [provider]  Lacosamide (VIMPAT) 100 MG TABS Take 1 tablet (100 mg total) by mouth 2 (two) times daily. 06/06/18  Yes Marcial Pacas, MD  multivitamin (RENA-VIT) TABS tablet Take 1 tablet by mouth daily.     Yes [provider]  omeprazole (PRILOSEC) 20 MG capsule Take 1 capsule (20 mg total) by mouth daily. Patient taking differently: Take 20 mg by mouth 2 (two) times daily.  01/31/18  Yes [name], MD  oxyCODONE (ROXICODONE) 5 MG immediate release tablet Take 1 tablet (5 mg total) by mouth every 4 (four) hours as needed. 07/04/18  Yes Angelia Mould, MD  traZODone (DESYREL) 50 MG tablet TAKE 1 TABLET(50 MG) BY MOUTH AT BEDTIME AS NEEDED FOR SLEEP Patient taking differently: Take 50 mg by mouth at bedtime as needed for sleep.  06/24/18  Yes Campbell Riches, MD  VOLTAREN 1 % GEL APPLY 2 GRAMS EXTERNALLY TO THE AFFECTED AREA FOUR TIMES DAILY Patient taking differently: Apply 2 g topically 4 (four) times daily as needed (pain).  05/09/18  Yes Jean Rosenthal, MD   Current Facility-Administered Medications  Medication Dose Route Frequency Provider Last Rate Last Dose  . 0.9 %  sodium chloride infusion  250 mL Intravenous PRN Jani Gravel, MD      . acetaminophen (TYLENOL) tablet 650 mg  650 mg Oral Q6H PRN Jani Gravel, MD       Or  . acetaminophen (TYLENOL) suppository 650 mg  650 mg Rectal Q6H PRN Jani Gravel, MD      . albuterol (PROVENTIL) (2.5 MG/3ML) 0.083% nebulizer solution 2.5 mg  2.5 mg Nebulization Q6H PRN Jani Gravel, MD      . amLODipine (NORVASC) tablet 5 mg  5 mg Oral Daily Jani Gravel, MD   5 mg at 07/10/18 0507  .  calcium acetate (PHOSLO) capsule 1,334 mg  1,334 mg Oral BID BM PRN Jani Gravel, MD      . calcium acetate (PHOSLO) capsule 2,001 mg  2,001 mg Oral TID WC Jani Gravel, MD      . carvedilol (COREG) tablet 25 mg  25 mg Oral BID WC Jani Gravel, MD   25 mg at 07/10/18 0507  . Chlorhexidine Gluconate Cloth 2 % PADS 6 each  6 each Topical Q0600 Loren Racer, PA-C      . cloNIDine (CATAPRES) tablet 0.1 mg  0.1 mg Oral Once Jani Gravel, MD      . cyclobenzaprine (FLEXERIL) tablet 10 mg  10 mg Oral QHS PRN Jani Gravel, MD      . heparin injection 5,000 Units  5,000 Units Subcutaneous Q8H Jani Gravel, MD      . hydrALAZINE (APRESOLINE) injection 10 mg  10 mg Intravenous Q6H PRN Jani Gravel, MD   10 mg at 07/10/18 0120  . hydrALAZINE (APRESOLINE) tablet 100 mg  100 mg Oral TID Jani Gravel, MD   100 mg at 07/10/18 3762  . hydrOXYzine (ATARAX/VISTARIL) tablet 25 mg  25 mg Oral QID PRN Jani Gravel, MD      . lacosamide (VIMPAT) tablet 100 mg  100 mg Oral BID Jani Gravel, MD   100 mg at 07/10/18 0120  . LORazepam (ATIVAN) injection 1 mg  1 mg Intravenous Q4H PRN Jani Gravel, MD   1 mg at 07/10/18 0024  . multivitamin (RENA-VIT) tablet 1 tablet  1 tablet Oral Daily Jani Gravel, MD      . nitroGLYCERIN 50 mg in dextrose 5 % 250 mL (0.2 mg/mL) infusion  0-200 mcg/min Intravenous Titrated Jani Gravel, MD 1.5 mL/hr at 07/10/18 0817 5 mcg/min at 07/10/18 0817  . pantoprazole (PROTONIX) EC tablet 40 mg  40 mg Oral Daily Jani Gravel, MD      . sodium chloride flush (NS) 0.9 % injection 3 mL  3 mL Intravenous Q12H Jani Gravel, MD   3 mL at 07/10/18 0200  . sodium chloride flush (NS) 0.9 % injection 3 mL  3 mL Intravenous PRN Jani Gravel, MD      . traZODone (DESYREL) tablet 50 mg  50 mg Oral QHS PRN Jani Gravel, MD       Labs: Basic Metabolic Panel: Recent Labs  Lab 07/05/18 0356 07/09/18 2149 07/10/18 0034  NA 136 137 138  K 3.9 4.8 5.1  CL 98 103 105  CO2 21* 14* 14*  GLUCOSE 94 104* 103*  BUN 75* 127* 131*   CREATININE 14.66* 21.35* 21.50*  CALCIUM 8.9 9.4 9.2   Liver Function Tests: Recent Labs  Lab 07/09/18 2149 07/10/18 0034  AST 15 19  ALT <5 7  ALKPHOS 65 62  BILITOT 1.0 0.9  PROT 7.9 7.2  ALBUMIN 3.6 3.4*   Recent Labs  Lab 07/10/18 0034  AMMONIA 40*   CBC: Recent Labs  Lab 07/05/18 0356 07/09/18 2149 07/10/18 0034  WBC 9.0 11.6* 11.3*  HGB 10.3* 12.5* 11.2*  HCT 34.5* 39.1 36.1*  MCV 93.5 91.1 90.9  PLT 163 140* 174   CBG: Recent Labs  Lab 07/09/18 2157 07/10/18 0033  GLUCAP 101* 90   Studies/Results: Ct Head Wo Contrast  Result Date: 07/09/2018 CLINICAL DATA:  Altered level of consciousness (LOC), unexplained. Seizure. EXAM: CT HEAD WITHOUT CONTRAST TECHNIQUE: Contiguous axial images were obtained from the base of the skull through the vertex without intravenous contrast. COMPARISON:  Head CT 06/04/2018 FINDINGS: Brain: Unchanged degree of atrophy and moderate to advanced chronic small vessel ischemia from prior exam. No intracranial hemorrhage, mass effect, or midline shift. No hydrocephalus. The basilar cisterns are patent. No evidence of territorial infarct or acute ischemia. No extra-axial or intracranial fluid collection. Vascular: No hyperdense vessel. Skull: No fracture or focal lesion. Sinuses/Orbits: Unchanged mild opacification of right mastoid air cells. No acute findings. Prior left cataract resection. Other: None. IMPRESSION: 1.  No acute intracranial abnormality. 2. Unchanged atrophy and chronic small vessel ischemia. Electronically Signed   By: Keith Rake M.D.   On: 07/09/2018 23:40   Dg Chest Port 1 View  Result Date: 07/09/2018 CLINICAL DATA:  Cough. EXAM: PORTABLE CHEST 1 VIEW COMPARISON:  Chest radiograph 07/04/2018. Additional prior chest radiographs reviewed. FINDINGS: Left pleural effusion and basilar opacity is unchanged from recent exam. Progressive peribronchial thickening. No new airspace disease. Unchanged heart size and  mediastinal contours. No pneumothorax. IMPRESSION: 1. Increased peribronchial thickening most suggestive of progressive pulmonary edema, atypical infection felt less likely. 2. Unchanged left basilar opacity and pleural effusion from recent exam. Electronically Signed   By: Keith Rake M.D.   On: 07/09/2018 22:53    Dialysis Orders:  Home HD - 4d/week, 3.5hr, EDW 76kg, AVF with buttonholes  Assessment/Plan: 1.  Seizure: Known seizure d/o - on Vimpat prior to admit, likely worsened by uremia. MRI pending. Ammonia slightly high. 2.  AMS: MRI pending. Likely related to #3. 3.  ESRD: Usual home HD - last dialyzed 8d ago, BUN/Cr high. K surprisingly ok, CO2 low. Will dialyze today and plan for serial HD x 2-3 day to try and improve AMS/uremia. 4.  Hypertension/volume: VOl ^, BP very high, + edema on CXR (maybe) On NTG drip now -2.5L UF with HD today. 5.  Anemia: Hgb 11.2, still in  range for ESRD - no ESA for now. 6.  Metabolic bone disease: Ca ok, Phos pending. Can resume home binders once more aware and eating. 7.  Hep B + 8.  HIV  Veneta Penton, Hershal Coria 07/10/2018, 9:04 AM  [facility] Pager: [phone]  Pt seen, examined and agree w A/P as above.  Kelly Splinter MD [employer] pager [phone]   07/10/2018, 3:13 PM

## 2018-07-10 NOTE — Progress Notes (Signed)
Pt started hemodialysis after one hour at 20:15 Pt had a seizure and become unresponsive.no loss of pulse no CPR. Code blue activated. Treatment terminated and Dr. Jonnie Finner notified. Ativan 1 mg given at 20:35. Pt intubated and transferred to ICU.VS:BP 168/92. HR 100. Report given to [place], Reap CN.

## 2018-07-10 NOTE — Progress Notes (Signed)
CRITICAL VALUE ALERT  Critical Value:  Lactic 4.7  Date & Time Notied: 2330 Provider Notified: Dr Phylliss Bob verbal  Orders Received/Actions taken: Dr notified   no new orders

## 2018-07-10 NOTE — Progress Notes (Signed)
Patient had seizures about 1 hour into HD tonight, was intubated for airway protection, probably aspirated. Admitted to ICU.  Having more seizure-like activity in ICU, will not be able to safely do regular dialysis, recommend CRRT have d/w CCM.   Kelly Splinter MD [employer] 07/10/2018, 10:34 PM

## 2018-07-10 NOTE — Consult Note (Signed)
PULMONARY / CRITICAL CARE MEDICINE   NAME:  Jeffrey [name], MRN:  [id number], DOB:  04-07-1956, LOS: 0 ADMISSION DATE:  07/09/2018, CONSULTATION DATE: 07/10/18 REFERRING MD: Dr Bonner Puna, CHIEF COMPLAINT: SZ, Respiratory failure  BRIEF HISTORY:    Admit with HTN urgency and Uremia, developed seizures  HISTORY OF PRESENT ILLNESS   62yoM with hx HIV, HBV, ESRD on HD (home hemodialysis), OSA, HTN, Aortic stenosis, Anemia, SZ, and Noncompliance, admitted 10/19 PM with acute uremic encephalopathy x 1 week and seizure on 10/19 PM prior to arrival. He had not had dialysis since last Saturday 10/12 due to recent AVF placement 32/44 (complicated by HTN urgency requiring admission). Since that time he has been intermittently altered and restless to the point he was unable to get dialysis treatments at home. On 10/16 he underwent cataract removal. On 10/19 he had a witnessed tonic-clonic seizure at home.   Following admission, patient was continued on his home Vimpat. Head CT unrevealing. Renal consulted as it was felt patient's seizure was due to uremia. Renal consulted on 10/19 PM. Patient had a second seizure on 10/20 early AM. Per RN notes, patient had a 3rd seizure on 10/12 at 10:30am.   Patient was in HD this evening. His dialysis RN reports he was very drowsy and difficult to arouse for the entire session. She says he would open eyes to voice but was nonverbal, which is not his baseline. She had last checked on him at 8:13pm. At 8:15pm she found him to be having tonic-clonic movements, which lasted until 8:20pm. They were unable to give ativan in dialysis as none was stocked on the unit. A code was called (never lost a pulse). Rapid response RN says when she arrived at approx 8:25pm there was no further tonic-clonic movements but that the patient was having nystagmus, concerning for possible ongoing seizure. Ativan 2mg  IV was given at that time and patient intubated by anesthesia. Patient transferred to the ICU;  shortly after arrival to the ICU he began having a fine tremor in skin over his legs and trunk that nursing was concerned may be seizure. I was not present in room to witness this. He was given another 2mg  Ativan IV at that time. Neurology was called. Renal was called as patient only completed 1 hour of dialysis tonight with 500cc of fluid removed.   SIGNIFICANT PAST MEDICAL HISTORY   See above  SIGNIFICANT EVENTS:  10/19: admit with seizure prior to arrival; found to be uremic and in HTN urgency 10/20 @ 12:16am: 2nd seizure 10/20 @ 10:30am: 3rd seizure 10/20 @ 7:15-8:15pm: dialysis 10/20 @ 8:15-8:20pm tonic clonic sz, 8:25pm nystagmus >> not waking up after ativan >> intubated >> transferred to ICU  STUDIES:   Head CT (07/09/18): no acute process Head CT (07/10/18): pending CXR (07/10/18): no acute process  CULTURES:  Blood cx (07/10/18): pending Sputum cx (07/10/18): pending  ANTIBIOTICS:  None   LINES/TUBES:  PIV's ETT 10/20>>  CONSULTANTS:  Neurology Renal  SUBJECTIVE:  Intubated and sedated   CONSTITUTIONAL: BP (!) 199/104   Pulse 99   Temp (!) 97.4 F (36.3 C) (Axillary)   Resp 16   Ht 6' (1.829 m)   Wt 77.5 kg   SpO2 100%   BMI 23.17 kg/m   I/O last 3 completed shifts: In: 20 [P.O.:20] Out: -    Vent Mode: PRVC FiO2 (%):  [50 %] 50 % Set Rate:  [16 bmp] 16 bmp Vt Set:  [010 mL] 620 mL PEEP:  [  [place] Pressure:  [16 cmH20] 16 cmH20  PHYSICAL EXAM: General: WDWN adult male, intubated and sedated, critically ill Neuro: PERRL, Grimaces to sternal rub, not obeying commands, spontaneous nonpurposeful movement of BUE HEENT: Orally intubated, MM moist  Cardiovascular: RRR no m/r/g Lungs: CTA b/l Abdomen: Soft NTND Musculoskeletal: no LE edema  Skin: no rashes   RESOLVED PROBLEM LIST   ASSESSMENT AND PLAN   62yoM with hx HIV, HBV, Anemia, OSA, AS, HTN, SZ, and ESRD on HD (at home 4x/week), admit with AMS due to Uremic  encephalopathy and HTN urgency in setting of noncompliance with dialysis, having had a seizure at home on 10/19 and now an additional 3 seizures in hospital on 10/20, requiring intubation.  1. Status epilepticus: - 1 sz prior to arrival on 10/19 and an additional 3 sz in hospital on 10/20, now requiring intubation as he was not awakening following being given ativan for the most recent seizure. Concern for status epilepticus - neurology consulted - stat video EEG ordered - give 1500mg  Keppra IV per Neuro. Continue Vimpat. Continue propofol infusion - seizures likely due to the uremia vs possible PRES from the HTN emergency  2. Acute hypoxic respiratory failure: - CXR on my review shows ETT on good position; no infiltrates - ABG at time of intubation showed severe metabolic acidosis. A total of 2 amps Bicarb have since been given. Repeat ABG shows improvement of the metabolic acidosis - continue CXR and ABG daily - VAP bundle, GI and DVT prophylaxis  3. HTN Emergency: - on NTG gtt but SBP still 190-210; starting propofol gtt.  - start labetalol and hydralazine PRN - SBP goal < 160; if remains high may need to start nicardipine infusion. Expect this HTN is due to fluid overload and should improve following dialysis.  - if MRI shows PRES, will need to adjust SBP goal to <120  4. ESRD on HD; Acute uremia: - noncompliant with home hemodialysis this week due to his degree of somnolence - received 1hr of dialysis tonight prior to the most recent seizure - Renal was consulted and wants to start CRRT instead of re-attempting more IHD; will place temp trialysis catheter now. Renal is placing CRRT orders  5. Acute encephalopathy: most likely due to his uremic encephalopathy vs due to his HTN emergency - plan for dialysis as above - treat HTN as above - ABG shows no hypercapnea - Head CT shows no acute changes   SUMMARY OF TODAY'S PLAN:  See above   Best Practice / Goals of Care / Disposition.    DVT PROPHYLAXIS: heparin SUP: H2 blocker  NUTRITION: NPO MOBILITY: bed rest  GOALS OF CARE: FULL CODE FAMILY DISCUSSIONS: Updated patient's brother at the bedside DISPOSITION ICU  LABS  Glucose Recent Labs  Lab 07/09/18 2157 07/10/18 0033 07/10/18 1832 07/10/18 2128  GLUCAP 101* 90 86 113*   BMET Recent Labs  Lab 07/09/18 2149 07/10/18 0034 07/10/18 2049  NA 137 138 135  K 4.8 5.1 4.3  CL 103 105 97*  CO2 14* 14* 11*  BUN 127* 131* 102*  CREATININE 21.35* 21.50* 17.53*  GLUCOSE 104* 103* 126*   Liver Enzymes Recent Labs  Lab 07/09/18 2149 07/10/18 0034  AST 15 19  ALT <5 7  ALKPHOS 65 62  BILITOT 1.0 0.9  ALBUMIN 3.6 3.4*   Electrolytes Recent Labs  Lab 07/09/18 2149 07/10/18 0034 07/10/18 2049  CALCIUM 9.4 9.2 8.8*   CBC Recent Labs  Lab 07/09/18  2149 07/10/18 0034 07/10/18 2049  WBC 11.6* 11.3* 11.7*  HGB 12.5* 11.2* 11.4*  HCT 39.1 36.1* 36.4*  PLT 140* 174 160   ABG Recent Labs  Lab 07/10/18 2048  PHART 7.150*  PCO2ART 36.1  PO2ART 336*   Coag's No results for input(s): APTT, INR in the last 168 hours.  Sepsis Markers Recent Labs  Lab 07/10/18 0034  LATICACIDVEN 1.6   Cardiac Enzymes No results for input(s): TROPONINI, PROBNP in the last 168 hours.  PAST MEDICAL HISTORY :   He  has a past medical history of Anemia, Anxiety, Aortic valve stenosis, Arthritis, Asthma, Dyspnea, End stage renal disease on home HD (07/10/2011), Hepatitis, Hepatitis B carrier ([place]), HIV infection ([place]), Hypertension, Hyperthyroidism, Pneumonia (several yrs ago), Seizure ([place]), Seizures ([place]) (06/02/2011), Sleep apnea, and Thrombocytopenia ([place]).  PAST SURGICAL HISTORY:  He  has a past surgical history that includes AV fistula placement (06/02/11); Other surgical history; Colonoscopy; Revison of arteriovenous fistula (Left, 10/10/2015); Revison of arteriovenous fistula (Left, 02/07/2016); Revison of arteriovenous fistula (Left, 04/10/2016); TEE without  cardioversion (N/A, 09/11/2016); RIGHT/LEFT HEART CATH AND CORONARY ANGIOGRAPHY (N/A, 02/17/2017); fistulaogram; Irrigation and debridement knee (Left, 09/15/2017); IR Fluoro Guide CV Line Right (09/16/2017); IR US Guide Vasc Access Right (09/16/2017); Coronary angioplasty; Colonoscopy with propofol (N/A, 01/27/2018); Esophagogastroduodenoscopy (egd) with propofol (N/A, 01/27/2018); EUS (N/A, 02/17/2018); biopsy (02/17/2018); Esophagogastroduodenoscopy (N/A, 02/17/2018); ERCP (03/21/2018); Esophagogastroduodenoscopy (egd) with propofol (N/A, 04/03/2018); IR DIALY SHUNT INTRO NEEDLE/INTRACATH INITIAL W/IMG LEFT (Left, 04/20/2018); TEE without cardioversion (N/A, 05/05/2018); and Revison of arteriovenous fistula (Left, 07/04/2018).  Allergies  Allergen Reactions  . Lisinopril Anaphylaxis and Shortness Of Breath    Throat swelling  . Penicillins Anaphylaxis and Other (See Comments)    Childhood allergy Has patient had a PCN reaction causing immediate rash, facial/tongue/throat swelling, SOB or lightheadedness with hypotension: Yes Has patient had a PCN reaction causing severe rash involving mucus membranes or skin necrosis: Unk Has patient had a PCN reaction that required hospitalization: Unk Has patient had a PCN reaction occurring within the last 10 years: No If all of the above answers are "NO", then may proceed with Cephalosporin use.   . Fentanyl Other (See Comments)    Lethargy, AMS  . Morphine And Related Other (See Comments)    "Not Himself"   No current facility-administered medications on file prior to encounter.    Current Outpatient Medications on File Prior to Encounter  Medication Sig  . albuterol (PROVENTIL) (2.5 MG/3ML) 0.083% nebulizer solution Take 2.5 mg by nebulization every 6 (six) hours as needed for wheezing or shortness of breath.  . ALPRAZolam (XANAX) 0.25 MG tablet Take 0.25 mg by mouth at bedtime as needed for anxiety.   Marland Kitchen amLODipine (NORVASC) 5 MG tablet Take 1 tablet (5 mg  total) by mouth daily.  . bictegravir-emtricitabine-tenofovir AF (BIKTARVY) 50-200-25 MG TABS tablet Take 1 tablet by mouth daily.  . calcium acetate (PHOSLO) 667 MG capsule Take 1,334-2,001 mg by mouth See admin instructions. Take 2,001 mg three times a day with each meal and 1,334 mg with each snack  . carvedilol (COREG) 25 MG tablet Take 1 tablet (25 mg total) by mouth 2 (two) times daily with a meal.  . cloNIDine (CATAPRES - DOSED IN MG/24 HR) 0.3 mg/24hr patch Place 1 patch (0.3 mg total) onto the skin once a week.  . cyclobenzaprine (FLEXERIL) 10 MG tablet Take 1 tablet (10 mg total) by mouth at bedtime as needed for muscle spasms.  Marland Kitchen epoetin alfa (EPOGEN,PROCRIT) 2000 UNIT/ML  injection Inject 2,000 Units into the vein 3 (three) times a week.   . ethyl chloride spray Apply 1 application topically daily as needed (HD- to numb).   . heparin 1000 UNIT/ML injection Inject 3,000 Units into the vein 4 (four) times a week.   . hydrALAZINE (APRESOLINE) 100 MG tablet TAKE 1 TABLET(100 MG) BY MOUTH THREE TIMES DAILY (Patient taking differently: Take 100 mg by mouth 3 (three) times daily. )  . hydrOXYzine (ATARAX/VISTARIL) 25 MG tablet Take 1 tablet (25 mg total) by mouth 4 (four) times daily as needed for itching.  . Ipratropium-Albuterol (COMBIVENT IN) Inhale 2 puffs into the lungs every 6 (six) hours as needed (as needed for shortness of breath).   . Lacosamide (VIMPAT) 100 MG TABS Take 1 tablet (100 mg total) by mouth 2 (two) times daily.  . multivitamin (RENA-VIT) TABS tablet Take 1 tablet by mouth daily.    Marland Kitchen omeprazole (PRILOSEC) 20 MG capsule Take 1 capsule (20 mg total) by mouth daily. (Patient taking differently: Take 20 mg by mouth 2 (two) times daily. )  . oxyCODONE (ROXICODONE) 5 MG immediate release tablet Take 1 tablet (5 mg total) by mouth every 4 (four) hours as needed.  . traZODone (DESYREL) 50 MG tablet TAKE 1 TABLET(50 MG) BY MOUTH AT BEDTIME AS NEEDED FOR SLEEP (Patient taking  differently: Take 50 mg by mouth at bedtime as needed for sleep. )  . VOLTAREN 1 % GEL APPLY 2 GRAMS EXTERNALLY TO THE AFFECTED AREA FOUR TIMES DAILY (Patient taking differently: Apply 2 g topically 4 (four) times daily as needed (pain). )   FAMILY HISTORY:   His family history includes Cancer in his father; Diabetes in his mother; Hypertension in his mother.  SOCIAL HISTORY:  He  reports that he quit smoking about 2 years ago. His smoking use included cigarettes. He quit after 10.00 years of use. He has never used smokeless tobacco. He reports that he does not drink alcohol or use drugs.  REVIEW OF SYSTEMS:    Review of Systems  Unable to perform ROS: Critical illness   60 minutes critical care time  Vernie Murders, MD Pulmonary & Critical Care Medicine Pager: [phone]

## 2018-07-10 NOTE — Code Documentation (Addendum)
Code Documentation/Rapid Response Team Event Note:  Responded to CODE BLUE at 2027. HD RN also called me at the same time. I responded immediately to HD. Upon arrival, per staff, patient had a seizure. I have seen the patient prior to this event and patient was given Ativan 1mg  IV at 2030. Seizure activity stopped but patient was not able to protect airway. Anesthesia Team arrived, patient was intubated, given Etomidate 60 and Roc 50. + PULSES and HR remained > 90.   After intubation, patient was still hypertensive (was prior to event happening as well). Patient was given Versed 2 mg IV for sedation at 2105, NTG drip started at 2110 at 87mcg/kg/min, 2115 1 amp Bicarb, Fentanyl 50 mcg IV, Versed 2 mg IV. Patient was transferred to 3M02 and upon arrival to 3M02, patient received another dose of Fentanyl 58mcg IV.   Once in the ICU, patient was given another dose of Fentanyl 100 mcg IV and Versed 2 mg IV at 2125  . Patient was started on Propofol infusion at 84mcg/kg/min.   NO LOSS OF PULSE OR CARDIAC ACTIVITY.   Call Time 2027 Arrival Time 2029 End Time 2200

## 2018-07-10 NOTE — Progress Notes (Signed)
Pt's brother stated pt was having seizure that lasted for about 5 minutes. RN and Dr. Bonner Puna came to bed side. IV ativan given.  Pt alert, unable to follow commands. Will cont to monitor pt.

## 2018-07-10 NOTE — Anesthesia Procedure Notes (Signed)
Procedure Name: Intubation Date/Time: 07/10/2018 8:40 AM Performed by: Myna Bright, CRNA Pre-anesthesia Checklist: Patient identified, Emergency Drugs available, Suction available and Patient being monitored Patient Re-evaluated:Patient Re-evaluated prior to induction Oxygen Delivery Method: Ambu bag Preoxygenation: Pre-oxygenation with 100% oxygen Induction Type: IV induction, Rapid sequence and Cricoid Pressure applied Laryngoscope Size: Glidescope and 4 Tube type: Subglottic suction tube Tube size: 7.5 mm Number of attempts: 1 Airway Equipment and Method: Video-laryngoscopy and Stylet Placement Confirmation: ETT inserted through vocal cords under direct vision,  positive ETCO2 and breath sounds checked- equal and bilateral Secured at: 21 cm Tube secured with: Tape Dental Injury: Teeth and Oropharynx as per pre-operative assessment

## 2018-07-10 NOTE — Progress Notes (Signed)
Pt had a seizure while in hemodialysis after one hour. Code blue was activated, pt intubated and transferred to 51M-ICU. Family notified

## 2018-07-10 NOTE — Progress Notes (Deleted)
Pt had a seizure while in hemodialysis after one hour. Code blue was activated, pt intubated and transferred to 65M-ICU. Family notified.

## 2018-07-10 NOTE — Progress Notes (Signed)
Pt was transferred to step down 6E21. Report given.  Eleanora Neighbor, RN

## 2018-07-10 NOTE — Progress Notes (Signed)
Pt is not following commands at this time. Pt will not squeeze hands, nor answer "yes" as before. Messaged MD to have pt assessed by MD. Also gave po BP meds per MD KIM, pt had a hard time swallowing meds. MD stated he will come assess and put in orders for a step down bed.   Eleanora Neighbor, RN

## 2018-07-10 NOTE — Progress Notes (Signed)
Pt transported from dialysis to 44m 02 without event after being suctioned.  RT given report and will be monitored.

## 2018-07-10 NOTE — Progress Notes (Signed)
Upon arrival from ED, pt began to seize (entire body jerking, pt had a bowel movement, and was vomiting). Rapid was called. Seizure lasted a few minutes. Pt was put on side until seizure resolved. Pt was able to respond to painful stimuli, as well as, calling name. Pt is very drowsy and will only speak very minimal. Pt was given Ativan which helped calm pt down. Pt's SBP was 240 manually and remained above 200 after a dose of Hydralazine 10mg  IV and waiting 30 min., Labetalol 10mg  IV and waiting 30 min. Spoke with MD on call and was given order for Labetalol 20mg  IV. Will wait 30 min and recheck. If still greater than 200 will possibly need step down bed.  Eleanora Neighbor, RN

## 2018-07-10 NOTE — Consult Note (Addendum)
Neurology Consultation Reason for Consult: Seizure Referring Physician: Valentina [name]  CC: Seizure  History is obtained from: Chart review, brother  HPI: Jeffrey [name] is a 62 y.o. male with a history of ESRD as well as seizures.  His first seizure was in 2012, felt to be due to renal failure.  He was not started on seizure medicines at the time and did not have any further until in July 2019.  He had 3 during that month.  He was started on 75 mg twice daily of Vimpat with an extra 25 mg after dialysis.  He was initially started on Keppra which was discontinued after it was suspected to be causing his thrombocytopenia.  He sees Dr. Krista [name] for his seizures and is managed on Vimpat 100 twice daily.  He submitted on 10/19 with acute uremic encephalopathy and seizure prior to arrival.  He had not had dialysis since last Saturday.  Renal was consulted for dialysis and his dialysis was going on this evening when he had a recurrent seizure.  He was only on observed for a couple of minutes and then seizure was seen to be going on around 8:15 PM.  This continued until 820.  On 8:25 PM rep response arrived and noted nystagmus and administered IV Ativan and the patient was intubated by anesthesia.  Was given another 2 mg of Ativan after this and has been persistently encephalopathic since that time.    ROS: Unable to obtain due to altered mental status.   Past Medical History:  Diagnosis Date  . Anemia   . Anxiety   . Aortic valve stenosis   . Arthritis   . Asthma    per pt hx  . Dyspnea   . End stage renal disease on home HD 07/10/2011   Started HD in September 2012 at [facility] with a tunneled HD catheter, now on home HD with NxtStage. Dialyzing through AVF L lower arm with buttonhole technique as of mid 2014. His brother does the HD treatments at home.  They are roommates for 23 years.  The brother works 3rd shift and gets off about 8am and then puts Jeffrey [name] on HD in the morning after getting home. Most of  the time he does HD about 4 times a week, for about 4 hours per treatment. Cause of ESRD was HTN according to patient. He says he let his health go and ending up with complications, and that he didn't like seeing doctors in those days.  He says he was diagnosed with severe HTN when he lived in [place] in his 10's.   . Hepatitis    Hepatitis B  . Hepatitis B carrier ([place])   . HIV infection ([place])   . Hypertension   . Hyperthyroidism    normal now  . Pneumonia several yrs ago  . Seizure ([place])   . Seizures ([place]) 06/02/2011   had a mild one in Sept. 2019  . Sleep apnea    to start Cpap soon  . Thrombocytopenia ([place])      Family History  Problem Relation Age of Onset  . Hypertension Mother   . Diabetes Mother   . Cancer Father      Social History:  reports that he quit smoking about 2 years ago. His smoking use included cigarettes. He quit after 10.00 years of use. He has never used smokeless tobacco. He reports that he does not drink alcohol or use drugs.   Exam: Current vital signs: BP Marland Kitchen)  199/104   Pulse 99   Temp (!) 90.5 F (32.5 C)   Resp 16   Ht 6' (1.829 m)   Wt 77.5 kg   SpO2 100%   BMI 23.17 kg/m  Vital signs in last 24 hours: Temp:  [90.5 F (32.5 C)-100 F (37.8 C)] 90.5 F (32.5 C) (10/20 2158) Pulse Rate:  [80-130] 99 (10/20 2130) Resp:  [12-25] 16 (10/20 2130) BP: (128-245)/(90-119) 199/104 (10/20 2130) SpO2:  [93 %-100 %] 100 % (10/20 2130) FiO2 (%):  [50 %] 50 % (10/20 2124) Weight:  [77.5 kg-78 kg] 77.5 kg (10/20 2130)   Physical Exam  Constitutional: Appears well-developed and well-nourished.  Psych: He is unresponsive Eyes: No scleral injection HENT: ET tube in place Head: Normocephalic.  Cardiovascular: Normal rate and regular rhythm.  Respiratory: Ventilated GI: Soft.  No distension. There is no tenderness.  Skin: WDI  Neuro: Mental Status: Patient is obtunded, he does partially open his eyes but he does not follow  commands. Cranial Nerves: II: Does not blink to threat.  Right pupil is 4 mm and reactive, left pupil is 3 mm and reactive III,IV, VI: Eyes are slightly exotropic, positive doll's eye response V:VII: Blinks to eyelid semination bilaterally X: Cough intact Motor: He has purposeful movements bilaterally.  He has abnormal jaw tremoring type movements with possible lipsmacking activity. sensory: Response to noxious stimulation bilaterally  Deep Tendon Reflexes: 2+ and symmetric in the patellae.  Cerebellar: Does not perform  I have reviewed labs in epic and the results pertinent to this consultation are: BMP-normal sodium, elevated BUN 22, elevated creatinine  I have reviewed the images obtained: CT head-unremarkable  Impression: 62 year old male with breakthrough seizures in the setting of uremic encephalopathy and severe hypertension.  Possibilities include PRES, seizure secondary to uremia, breakthrough seizures of unclear cause.  Given subtle movements that have been concerning for possible seizure activity, I would favor doing an overnight EEG to make sure he is not having subtle subclinical seizures.  Recommendations: 1) stat overnight EEG 2) Keppra 15001, but I would not continue this given that it cause thrombus cytopenia in the past 3) increase Vimpat to 150 twice daily 4) neurology will continue to follow   This patient is critically ill and at significant risk of neurological worsening, death and care requires constant monitoring of vital signs, hemodynamics,respiratory and cardiac monitoring, neurological assessment, discussion with family, other specialists and medical decision making of high complexity. I spent 35 minutes of neurocritical care time  in the care of  this patient.  Roland Rack, MD [facility] [phone]  If 7pm- 7am, please page neurology on call as listed in [place]. 07/11/2018  12:34 AM

## 2018-07-10 NOTE — Plan of Care (Signed)
Code blue activated while in HD. Pt had a seizure no loss of pulse no CPR Ativan 1 mg  given with  No response. Not protecting airway patient is intubated and  transferred to ICU HX of Seizure DO, non-compliance, HIV, Currently treated for hypertensive urgency on nitro gtt Last Cr 20 no HD for 1 wk Would benefit from CT head once stable  Jeffrey [name] 8:53 PM    .

## 2018-07-10 NOTE — Progress Notes (Signed)
Patient transported on ventilator to CT and back to 9R41 with no complications. Vitals stable.

## 2018-07-10 NOTE — Progress Notes (Addendum)
PROGRESS NOTE  Jeffrey [name]  ZOX:[id number] DOB: 12-28-1955 DOA: 07/09/2018 PCP: Velna Ochs, MD   Brief Narrative: Jeffrey [name] is a 62 y.o. male with a history of HIV on biktarvy (CD4 160, undetectable VL), ESRD getting HD at home, recent AVF revision, recent cataract surgery, seizure disorder and aortic stenosis with surgery planned 10/30 who presented due to tonic-clonic seizure on 10/19 in the setting of somnolence worsening over the previous few days. He had not been recovering well from AVF revision 10/14, required observation for HTN urgency in PACU, discharged home and has not been alert enough for his brother to get him to take dialysis treatments since that time (last HD 10/12). He had continued taking medications including vimpat as usual and left eye drops following cataract surgery 10/16. On arrival he had pulmonary edema on CXR with pleural effusion, hypertensive urgency, bicarbonate was 14, K 4.8, BUN 127, creatinine 21.3. WBC 11.6 with normal lactic acid level. CT head showed no acute changes on background of small vessel disease. He was admitted and started on oral blood pressure medications with nephrology consulted for urgent hemodialysis. After admission he displayed further seizure activity and worsening blood pressure for which ativan and nitroglycerin gtt were started.  Assessment & Plan: Principal Problem:   Seizure ([place]) Active Problems:   End stage renal disease on home HD   Thrombocytopenia (HCC)   Anemia of chronic disease   Altered mental status   Seizure disorder (HCC)  Seizures in patient with seizure disorder: Precipitant most likely uremia. Has been getting vimpat as usual.  - Continue vimpat and given prn ativan for now. If excessive sedation would consider dilantin but would d/w neurology first. - Discussed by phone with Dr. Cheral Marker, neurology, who reviewed chart. Felt vimpat toxicity could be contributing in the absence of clearance by dialysis but  generally recommended supportive care pending serial dialysis. No formal consultation required at this time.   Hypertensive urgency: Due to volume excess, anticipate improvement with dialysis.  - Continue nitroglycerin drip for now, taper to off as able following dialysis - Start po medications as able based on mentation: Norvasc, coreg, hydralazine  Acute metabolic encephalopathy: Post-ictal and uremic. Lactic acid, B12, TSH normal. Very low suspicion for hepatic encephalopathy without LFT elevation and ammonia 40 with ULN 35. Will not yet start lactulose. - Neuro checks, dialysis.  - Work up with CT showed no acute changes, MRI pending. Does not need EEG more than MRI or HD.  Pulmonary edema: Without hypoxia.  - Dialysis  ESRD having missed HD x8 days: Complicated by volume overload, pulmonary edema, HTN urgency, metabolic acidosis, but not hyperkalemia.  - Nephrology consulted, urgent HD today and serially over next few days.   HIV: CD4 160, undetectable viral load. Adherent to HAART.  - Ok to continue biktarvy based on recent literature. Discussed with pharmacy.   Leukocytosis: Suspected to be reactive in absence of fever or localizing symptoms/signs. ESR 13. If cough develops, would empirically cover for aspiration PNA given possible infiltrate on XR and AMS. - Monitor off antimicrobials.  Cataracts s/p surgery:  - Appears to be healing normally. I have called his pharmacy and confirmed gtt's prescribed by ophthalmologist, Dr. Katy Fitch. These have been ordered.  Aortic stenosis: Plans surgery 10/30.  DVT prophylaxis: Heparin Code Status: Full Family Communication: Brother at bedside Disposition Plan: Uncertain  Consultants:   Nephrology  Neurology by phone only  Procedures:   HD planned 10/20.  Antimicrobials:  None   Subjective:  Pt alert but answers yes to all questions. Normal mental status is completely oriented, conversant. Requires some help with mobility at  baseline.  Objective: Vitals:   07/10/18 0805 07/10/18 0816 07/10/18 1034 07/10/18 1145  BP: (!) 128/100 (!) 170/100 (!) 165/100 (!) 160/98  Pulse:  80  84  Resp:  (!) 25  (!) 21  Temp:    98.1 F (36.7 C)  TempSrc:    Axillary  SpO2:  96%    Weight:      Height:        Intake/Output Summary (Last 24 hours) at 07/10/2018 1428 Last data filed at 07/10/2018 1300 Gross per 24 hour  Intake 20 ml  Output -  Net 20 ml   Filed Weights   07/09/18 2146  Weight: 74.3 kg    Gen: 62 y.o. male poorly cooperative, no acute distress Pulm: Non-labored breathing room air. Decreased at bases. SpO2 consistently >95% after given ativan and more lethargic.  CV: Regular rate and rhythm. II/VI systolic murmur at base, rub, or gallop. No JVD, no pedal edema. GI: Abdomen soft, non-tender, non-distended, with normoactive bowel sounds. No organomegaly or masses felt. Ext: Warm, left forearm AVF +thrill, no erythema or discharge. Skin: No rashes, lesions or ulcers Neuro: Alert, not oriented or cooperative with exam. Moving all extremities. Psych: UTD  Data Reviewed: I have personally reviewed following labs and imaging studies  CBC: Recent Labs  Lab 07/04/18 0630 07/05/18 0356 07/09/18 2149 07/10/18 0034  WBC  --  9.0 11.6* 11.3*  HGB 11.2* 10.3* 12.5* 11.2*  HCT 33.0* 34.5* 39.1 36.1*  MCV  --  93.5 91.1 90.9  PLT  --  163 140* 414   Basic Metabolic Panel: Recent Labs  Lab 07/04/18 0630 07/05/18 0356 07/09/18 2149 07/10/18 0034  NA 137 136 137 138  K 3.5 3.9 4.8 5.1  CL  --  98 103 105  CO2  --  21* 14* 14*  GLUCOSE 96 94 104* 103*  BUN  --  75* 127* 131*  CREATININE  --  14.66* 21.35* 21.50*  CALCIUM  --  8.9 9.4 9.2   GFR: Estimated Creatinine Clearance: 3.6 mL/min (A) (by C-G formula based on SCr of 21.5 mg/dL (H)). Liver Function Tests: Recent Labs  Lab 07/09/18 2149 07/10/18 0034  AST 15 19  ALT <5 7  ALKPHOS 65 62  BILITOT 1.0 0.9  PROT 7.9 7.2  ALBUMIN 3.6  3.4*   No results for input(s): LIPASE, AMYLASE in the last 168 hours. Recent Labs  Lab 07/10/18 0034  AMMONIA 40*   Coagulation Profile: No results for input(s): INR, PROTIME in the last 168 hours. Cardiac Enzymes: No results for input(s): CKTOTAL, CKMB, CKMBINDEX, TROPONINI in the last 168 hours. BNP (last 3 results) No results for input(s): PROBNP in the last 8760 hours. HbA1C: No results for input(s): HGBA1C in the last 72 hours. CBG: Recent Labs  Lab 07/09/18 2157 07/10/18 0033  GLUCAP 101* 90   Lipid Profile: No results for input(s): CHOL, HDL, LDLCALC, TRIG, CHOLHDL, LDLDIRECT in the last 72 hours. Thyroid Function Tests: Recent Labs    07/10/18 0034  TSH 4.286   Anemia Panel: Recent Labs    07/10/18 0034  VITAMINB12 393   Urine analysis:    Component Value Date/Time   COLORURINE AMBER (A) 06/14/2011 1513   APPEARANCEUR CLOUDY (A) 06/14/2011 1513   LABSPEC 1.020 06/14/2011 1513   PHURINE 6.0 06/14/2011 1513   GLUCOSEU NEGATIVE 06/14/2011 1513  HGBUR LARGE (A) 06/14/2011 1513   BILIRUBINUR SMALL (A) 06/14/2011 1513   KETONESUR NEGATIVE 06/14/2011 1513   PROTEINUR >300 (A) 06/14/2011 1513   UROBILINOGEN 0.2 06/14/2011 1513   NITRITE NEGATIVE 06/14/2011 1513   LEUKOCYTESUR SMALL (A) 06/14/2011 1513   Recent Results (from the past 240 hour(s))  MRSA PCR Screening     Status: None   Collection Time: 07/04/18  5:18 PM  Result Value Ref Range Status   MRSA by PCR NEGATIVE NEGATIVE Final    Comment:        The GeneXpert MRSA Assay (FDA approved for NASAL specimens only), is one component of a comprehensive MRSA colonization surveillance program. It is not intended to diagnose MRSA infection nor to guide or monitor treatment for MRSA infections. Performed at [facility], [place] [address] [place], [place] [address]       Radiology Studies: Ct Head Wo Contrast  Result Date: 07/09/2018 CLINICAL DATA:  Altered level of consciousness (LOC),  unexplained. Seizure. EXAM: CT HEAD WITHOUT CONTRAST TECHNIQUE: Contiguous axial images were obtained from the base of the skull through the vertex without intravenous contrast. COMPARISON:  Head CT 06/04/2018 FINDINGS: Brain: Unchanged degree of atrophy and moderate to advanced chronic small vessel ischemia from prior exam. No intracranial hemorrhage, mass effect, or midline shift. No hydrocephalus. The basilar cisterns are patent. No evidence of territorial infarct or acute ischemia. No extra-axial or intracranial fluid collection. Vascular: No hyperdense vessel. Skull: No fracture or focal lesion. Sinuses/Orbits: Unchanged mild opacification of right mastoid air cells. No acute findings. Prior left cataract resection. Other: None. IMPRESSION: 1.  No acute intracranial abnormality. 2. Unchanged atrophy and chronic small vessel ischemia. Electronically Signed   By: Keith Rake M.D.   On: 07/09/2018 23:40   Dg Chest Port 1 View  Result Date: 07/09/2018 CLINICAL DATA:  Cough. EXAM: PORTABLE CHEST 1 VIEW COMPARISON:  Chest radiograph 07/04/2018. Additional prior chest radiographs reviewed. FINDINGS: Left pleural effusion and basilar opacity is unchanged from recent exam. Progressive peribronchial thickening. No new airspace disease. Unchanged heart size and mediastinal contours. No pneumothorax. IMPRESSION: 1. Increased peribronchial thickening most suggestive of progressive pulmonary edema, atypical infection felt less likely. 2. Unchanged left basilar opacity and pleural effusion from recent exam. Electronically Signed   By: Keith Rake M.D.   On: 07/09/2018 22:53    Scheduled Meds: . amLODipine  5 mg Oral Daily  . bictegravir-emtricitabine-tenofovir AF  1 tablet Oral Daily  . calcium acetate  2,001 mg Oral TID WC  . carvedilol  25 mg Oral BID WC  . Chlorhexidine Gluconate Cloth  6 each Topical Q0600  . cloNIDine  0.1 mg Oral Once  . heparin  5,000 Units Subcutaneous Q8H  . hydrALAZINE  100  mg Oral TID  . ketorolac  1 drop Left Eye QID  . lacosamide  100 mg Oral BID  . multivitamin  1 tablet Oral Daily  . ofloxacin  1 drop Left Eye QID  . pantoprazole  40 mg Oral Daily  . prednisoLONE acetate  1 drop Left Eye QID  . sodium chloride flush  3 mL Intravenous Q12H   Continuous Infusions: . sodium chloride    . nitroGLYCERIN 5 mcg/min (07/10/18 0817)     LOS: 0 days   Time spent: 35 minutes.  Patrecia Pour, MD [facility] [email or web address] Password [email or web address] 07/10/2018, 2:28 PM

## 2018-07-10 NOTE — Progress Notes (Signed)
Xcover  RN called to states that patient has worsening AMS  Pt is able to open eyes and follow commands,  His brother states " he needs dialysis"    Heent: pupils 1.5 mm symmetric, direct , consensual, near intact Neck: no jvd Heart: rrr, s1, s2,  Lung: ctab ABd: soft Ext: no c/c/e cn2-12 intact Moves all 4 ext to command, strength 5/5 Reflexes 2+ symmetric, diffuse with downgoing toes bialterally.   A/P AMS probably due to lack of dialysis Check MRI brain r/o CVA If abnormal may need neuro consultation  Hypertension uncontrolled Will move to stepdown bed and start on iv nitro Try to gradually bring down bp Dialysis should hopefully help this as well

## 2018-07-10 NOTE — Code Documentation (Signed)
Code blue called in hemodialysis for patient due to seizure like activity. Patient did not lose pulse and CPR was not necessary. Code team at bedside for management until handoff to PCCM.  Patient admitted night of 10/19 with seizure after missing HD for 1 wk; he was encephalopathic with BUN of 127, Cr 21, K 4.8; he was unable to get HD until tonight. An hour into HD session he was witnessed to have a seizure. He has a h/o of seizures isolated, in setting of PRES, and in setting of HD thought to be due to fluid shifts. Current admission also complicated by uncontrolled HTN and was started on nitroglycerin drip.  On arrival, patient had significant vomitus in oropharynx and diffuse rhonchi throughout lung fields; he had a lack of a gag reflex raising concern for inability to protect airway. He was intubated by anesthesia.  Sedation was provided with boluses of fentanyl and versed. ABG resulted with 7.15/36/336/12 after intubation on 100% FiO2. He was given amp of bicarb for acidosis. BP steadily increased and nitro drip was restarted.  Other labs/imaging ordered: CBC, Bmet, CXR STAT MRI still pending from [facility] 10/20.  Care transferred to [facility].  Alphonzo Grieve, MD IMTS - PGY3

## 2018-07-10 NOTE — Progress Notes (Signed)
Pt's BP 210/100, checked manually, MD on call made aware. Clarified with MD to give IV hydralazine prior to dialysis. Spoke with dialysis pt should be going a little after 6. Will cont to monitor pt.

## 2018-07-11 ENCOUNTER — Ambulatory Visit: Payer: Medicare Other

## 2018-07-11 ENCOUNTER — Inpatient Hospital Stay (HOSPITAL_COMMUNITY): Payer: Medicare Other

## 2018-07-11 ENCOUNTER — Encounter (HOSPITAL_COMMUNITY)
Admission: EM | Disposition: A | Discharge status: Home Health | Payer: Self-pay | Source: Home / Self Care | Attending: Pulmonary Disease

## 2018-07-11 ENCOUNTER — Ambulatory Visit (HOSPITAL_COMMUNITY): Admission: RE | Admit: 2018-07-11 | Payer: Medicare Other | Source: Ambulatory Visit | Admitting: Cardiovascular Disease

## 2018-07-11 DIAGNOSIS — R569 Unspecified convulsions: Secondary | ICD-10-CM

## 2018-07-11 LAB — RENAL FUNCTION PANEL
Albumin: 3 g/dL — ABNORMAL LOW (ref 3.5–5.0)
Albumin: 2.8 g/dL — ABNORMAL LOW (ref 3.5–5.0)
Anion gap: 22 — ABNORMAL HIGH (ref 5–15)
Anion gap: 9 (ref 5–15)
BUN: 100 mg/dL — AB (ref 8–23)
BUN: 56 mg/dL — ABNORMAL HIGH (ref 8–23)
CALCIUM: 8.2 mg/dL — AB (ref 8.9–10.3)
CHLORIDE: 102 mmol/L (ref 98–111)
CO2: 16 mmol/L — ABNORMAL LOW (ref 22–32)
CO2: 22 mmol/L (ref 22–32)
CREATININE: 10.13 mg/dL — AB (ref 0.61–1.24)
Calcium: 8.5 mg/dL — ABNORMAL LOW (ref 8.9–10.3)
Chloride: 105 mmol/L (ref 98–111)
Creatinine, Ser: 17.25 mg/dL — ABNORMAL HIGH (ref 0.61–1.24)
GFR calc Af Amer: 3 mL/min — ABNORMAL LOW (ref >60)
GFR calc non Af Amer: 2 mL/min — ABNORMAL LOW (ref >60)
GFR, EST AFRICAN AMERICAN: 5 mL/min — AB (ref >60)
GFR, EST NON AFRICAN AMERICAN: 5 mL/min — AB (ref >60)
GLUCOSE: 81 mg/dL (ref 70–99)
Glucose, Bld: 82 mg/dL (ref 70–99)
POTASSIUM: 4.3 mmol/L (ref 3.5–5.1)
Phosphorus: 5.4 mg/dL — ABNORMAL HIGH (ref 2.5–4.6)
Phosphorus: 3.2 mg/dL (ref 2.5–4.6)
Potassium: 3.9 mmol/L (ref 3.5–5.1)
SODIUM: 140 mmol/L (ref 135–145)
SODIUM: 136 mmol/L (ref 135–145)

## 2018-07-11 LAB — GLUCOSE, CAPILLARY
GLUCOSE-CAPILLARY: 86 mg/dL (ref 70–99)
Glucose-Capillary: 84 mg/dL (ref 70–99)
Glucose-Capillary: 81 mg/dL (ref 70–99)
Glucose-Capillary: 87 mg/dL (ref 70–99)
Glucose-Capillary: 86 mg/dL (ref 70–99)
Glucose-Capillary: 91 mg/dL (ref 70–99)

## 2018-07-11 LAB — BLOOD GAS, ARTERIAL
Acid-base deficit: 6 mmol/L — ABNORMAL HIGH (ref 0.0–2.0)
Bicarbonate: 19.3 mmol/L — ABNORMAL LOW (ref 20.0–28.0)
DRAWN BY: 313941
FIO2: 40
MECHVT: 620 mL
O2 Saturation: 98.7 %
PEEP: 5 cmH2O
Patient temperature: 96
RATE: 16 resp/min
pCO2 arterial: 38 mmHg (ref 32.0–48.0)
pH, Arterial: 7.317 — ABNORMAL LOW (ref 7.350–7.450)
pO2, Arterial: 154 mmHg — ABNORMAL HIGH (ref 83.0–108.0)

## 2018-07-11 LAB — MAGNESIUM
MAGNESIUM: 2 mg/dL (ref 1.7–2.4)
Magnesium: 1.9 mg/dL (ref 1.7–2.4)
Magnesium: 1.9 mg/dL (ref 1.7–2.4)

## 2018-07-11 LAB — PHOSPHORUS
PHOSPHORUS: 5.4 mg/dL — AB (ref 2.5–4.6)
Phosphorus: 3.6 mg/dL (ref 2.5–4.6)
Phosphorus: 3.2 mg/dL (ref 2.5–4.6)

## 2018-07-11 LAB — TROPONIN I
TROPONIN I: 0.09 ng/mL — AB (ref <0.03)
TROPONIN I: 0.07 ng/mL — AB (ref <0.03)

## 2018-07-11 LAB — PROCALCITONIN: Procalcitonin: 0.87 ng/mL

## 2018-07-11 LAB — CBC
HEMATOCRIT: 30.5 % — AB (ref 39.0–52.0)
HEMOGLOBIN: 9.6 g/dL — AB (ref 13.0–17.0)
MCH: 28.4 pg (ref 26.0–34.0)
MCHC: 31.5 g/dL (ref 30.0–36.0)
MCV: 90.2 fL (ref 80.0–100.0)
Platelets: 115 10*3/uL — ABNORMAL LOW (ref 150–400)
RBC: 3.38 MIL/uL — ABNORMAL LOW (ref 4.22–5.81)
RDW: 17.4 % — ABNORMAL HIGH (ref 11.5–15.5)
WBC: 6.8 10*3/uL (ref 4.0–10.5)
nRBC: 0 % (ref 0.0–0.2)

## 2018-07-11 LAB — APTT: aPTT: 37 seconds — ABNORMAL HIGH (ref 24–36)

## 2018-07-11 LAB — LACTIC ACID, PLASMA
LACTIC ACID, VENOUS: 1.9 mmol/L (ref 0.5–1.9)
Lactic Acid, Venous: 1.3 mmol/L (ref 0.5–1.9)

## 2018-07-11 SURGERY — RIGHT/LEFT HEART CATH AND CORONARY ANGIOGRAPHY
Anesthesia: LOCAL

## 2018-07-11 MED ORDER — PRO-STAT SUGAR FREE PO LIQD: 30.0000 mL | Freq: Two times a day (BID) | ORAL | Status: DC

## 2018-07-11 MED ORDER — PRO-STAT SUGAR FREE PO LIQD
60.0000 mL | Freq: Two times a day (BID) | ORAL | Status: DC
  Administered 2018-07-11 – 2018-07-13 (×4): 60 mL
  Filled 2018-07-11 (×4): qty 60

## 2018-07-11 MED ORDER — LACOSAMIDE 50 MG PO TABS
150.0000 mg | ORAL_TABLET | Freq: Two times a day (BID) | ORAL | Status: DC
  Administered 2018-07-11 – 2018-07-19 (×17): 150 mg via ORAL
  Filled 2018-07-11 (×17): qty 3

## 2018-07-11 MED ORDER — CLONIDINE HCL 0.1 MG PO TABS
0.1000 mg | ORAL_TABLET | Freq: Three times a day (TID) | ORAL | Status: DC
  Administered 2018-07-11 – 2018-07-12 (×3): 0.1 mg via ORAL
  Filled 2018-07-11 (×3): qty 1

## 2018-07-11 MED ORDER — FAMOTIDINE 40 MG/5ML PO SUSR
20.0000 mg | Freq: Every day | ORAL | Status: DC
  Administered 2018-07-12 – 2018-07-19 (×7): 20 mg
  Filled 2018-07-11 (×9): qty 2.5

## 2018-07-11 MED ORDER — VITAL HIGH PROTEIN PO LIQD: 1000.0000 mL | ORAL | Status: DC

## 2018-07-11 MED ORDER — VITAL 1.5 CAL PO LIQD
1000.0000 mL | ORAL | Status: DC
  Administered 2018-07-11: 1000 mL
  Filled 2018-07-11 (×2): qty 1000

## 2018-07-11 NOTE — Progress Notes (Signed)
Initial Nutrition Assessment  DOCUMENTATION CODES:   Non-severe (moderate) malnutrition in context of chronic illness  INTERVENTION:   Initiate TF via OG tube: - Vital 1.5 @ 35 ml/hr (840 ml/day) - Pro-stat 60 ml BID  Tube feeding regimen provides 1660 kcal, 117 grams of protein, and 638 ml of H2O.   Tube feeding regimen and current propofol provides 1906 total kcal (97% of needs).  NUTRITION DIAGNOSIS:   Moderate Malnutrition related to chronic illness (HIV, ESRD on HD) as evidenced by moderate fat depletion, moderate muscle depletion.  GOAL:   Patient will meet greater than or equal to 90% of their needs  MONITOR:   Vent status, Skin, Labs, Weight trends, I & O's  REASON FOR ASSESSMENT:   Ventilator, Consult Enteral/tube feeding initiation and management  ASSESSMENT:   62 year old male who presented to the ED on 10/19 with seizures and having missed dialysis since 10/12. PMH significant for HIV, hepatitis B, ESRD on home dialysis 4 times per week, aortic valve stenosis, anemia, hypertension, hyperthyroidism, and seizure disorder. Pt was recently discharge from the hospital after being admitted for AV fistula revision.  10/20 - code blue called in hemodialysis for seizure-like activity, pt intubated for airway protection, likely aspiration 10/21 - temporary HD catheter placed, CRRT initiated  Per nephrology MD note this AM, likely to keep pt on CRRT for the next 24 hours.  No family at bedside at time of visit. Discussed pt with RN.  Discussed pt with MD. MD ordered consult to start tube feeding.  Noted significant weight fluctuations over the past 1 year. Suspect weight fluctuations related to fluid status. Will monitor weight trends during admission.  Patient is currently intubated on ventilator support MVe: 10.0 L/min Temp (24hrs), Avg:96.8 F (36 C), Min:90.5 F (32.5 C), Max:100 F (37.8 C)  Propofol: 9.3 ml/hr (246 kcal/day) Fentanyl: 20  ml/hr Nitroglycerin: 9 ml/hr  Medications reviewed and include: Phoslo TID with meals, Pepcid 40 mg BID, sliding scale Novolog q 4 hours, rena-vit daily  Labs reviewed: CO2 16 (L), BUN 100 (H), creatinine 17.25 (H), phosphorus 5.4 (H) CBG's: 86, 84, 113, 86, 90, 101 x 24 hours  Total output: 567 ml x 24 hours  NUTRITION - FOCUSED PHYSICAL EXAM:    Most Recent Value  Orbital Region  Moderate depletion  Upper Arm Region  Moderate depletion  Thoracic and Lumbar Region  Moderate depletion  Buccal Region  Unable to assess  Temple Region  Moderate depletion  Clavicle Bone Region  Moderate depletion  Clavicle and Acromion Bone Region  Moderate depletion  Scapular Bone Region  Unable to assess  Dorsal Hand  No depletion  Patellar Region  Moderate depletion  Anterior Thigh Region  Moderate depletion  Posterior Calf Region  Moderate depletion  Edema (RD Assessment)  Mild [generalized]  Hair  Reviewed  Eyes  Unable to assess  Mouth  Unable to assess  Skin  Reviewed  Nails  Reviewed      Diet Order:   Diet Order            Diet NPO time specified  Diet effective now              EDUCATION NEEDS:   Not appropriate for education at this time  Skin:  Skin Assessment: Skin Integrity Issues: Incisions: left arm  Last BM:  unknown/PTA  Height:   Ht Readings from Last 1 Encounters:  07/09/18 6' (1.829 m)    Weight:   Wt Readings from Last 1  Encounters:  07/11/18 78 kg   EDW: 76 kg  Ideal Body Weight:  80.91 kg  BMI:  Body mass index is 23.32 kg/m.  Estimated Nutritional Needs:   Kcal:  1959  Protein:  110-125 grams  Fluid:  per MD    Gaynell Face, MS, RD, LDN Inpatient Clinical Dietitian Pager: [phone] Weekend/After Hours: [phone]

## 2018-07-11 NOTE — Progress Notes (Signed)
Subjective:  Events noted, had sz activity while getting HD last night- moved to ICU- had temp HD cath placed and CRRT initiated.  Has been running well.  Neuro involved to assist with sz  Objective Vital signs in last 24 hours: Vitals:   07/11/18 0600 07/11/18 0700 07/11/18 0746 07/11/18 0803  BP: 131/63 119/62 127/67   Pulse: 66 60 (!) 57   Resp: _0 Temp:      TempSrc:      SpO2: 100% 100% 100% 100%  Weight:      Height:       Weight change: 3.7 kg  Intake/Output Summary (Last 24 hours) at 07/11/2018 0813 Last data filed at 07/11/2018 0700 Gross per 24 hour  Intake 577.83 ml  Output 567 ml  Net 10.83 ml    Dialysis Orders:  Home HD - 4d/week, 3.5hr, EDW 76kg, AVF with buttonholes  Assessment/Plan: 1.  Seizure: Known seizure d/o - on Vimpat prior to admit, likely worsened by uremia. CT neg for acute finding. Neuro involved- for EEG- vimpat, keppra 2.  AMS: MRI pending. Likely related to #3. 3.  ESRD: Usual home HD - last dialyzed 10/12 at home, BUN/Cr high. K surprisingly ok, CO2 low. Tried to do IHD- had sz- now moved to ICU for CRRT. Running well - awaiting next labs- liklly keep on CRRT next 24 hours 4.  Hypertension/volume: VOl ^, BP very high, no peripheral edema On NTG drip now  Controlled this AM- norvasc, coreg, hydralazine- also iv ntg and propofol- will keep even for now  5.  Anemia: Hgb 11.2, dropped to the 9/s, add ESA- check iron 6.  Metabolic bone disease: Ca ok, Phos 5.4. Can resume home binders once more aware and eating. 7.  Hep B + 8.  HIV    Louis Meckel    Labs: Basic Metabolic Panel: Recent Labs  Lab 07/09/18 2149 07/10/18 0034 07/10/18 2049 07/11/18 0341  NA 137 138 135  --   K 4.8 5.1 4.3  --   CL 103 105 97*  --   CO2 14* 14* 11*  --   GLUCOSE 104* 103* 126*  --   BUN 127* 131* 102*  --   CREATININE 21.35* 21.50* 17.53*  --   CALCIUM 9.4 9.2 8.8*  --   PHOS  --   --   --  5.4*   Liver Function Tests: Recent Labs   Lab 07/09/18 2149 07/10/18 0034  AST 15 19  ALT <5 7  ALKPHOS 65 62  BILITOT 1.0 0.9  PROT 7.9 7.2  ALBUMIN 3.6 3.4*   No results for input(s): LIPASE, AMYLASE in the last 168 hours. Recent Labs  Lab 07/10/18 0034 07/10/18 2236  AMMONIA 40* 38*   CBC: Recent Labs  Lab 07/05/18 0356 07/09/18 2149 07/10/18 0034 07/10/18 2049 07/11/18 0341  WBC 9.0 11.6* 11.3* 11.7* 6.8  HGB 10.3* 12.5* 11.2* 11.4* 9.6*  HCT 34.5* 39.1 36.1* 36.4* 30.5*  MCV 93.5 91.1 90.9 92.4 90.2  PLT 163 140* 174 160 115*   Cardiac Enzymes: Recent Labs  Lab 07/10/18 2236 07/11/18 0341  TROPONINI 0.07* 0.09*   CBG: Recent Labs  Lab 07/10/18 0033 07/10/18 1832 07/10/18 2128 07/11/18 0614 07/11/18 0722  GLUCAP 90 86 113* 84 86    Iron Studies: No results for input(s): IRON, TIBC, TRANSFERRIN, FERRITIN in the last 72 hours. Studies/Results: Ct Head Wo Contrast  Result Date: 07/10/2018 CLINICAL DATA:  Seizure.  HIV.  Inpatient. EXAM: CT HEAD WITHOUT CONTRAST TECHNIQUE: Contiguous axial images were obtained from the base of the skull through the vertex without intravenous contrast. COMPARISON:  07/09/2018 head CT. FINDINGS: Brain: No evidence of parenchymal hemorrhage or extra-axial fluid collection. No mass lesion, mass effect, or midline shift. No CT evidence of acute infarction. Nonspecific moderate subcortical and periventricular white matter hypodensity, most in keeping with chronic small vessel ischemic change. Generalized cerebral volume loss. Cerebral ventricle sizes are stable and concordant with the degree of cerebral volume loss. Vascular: No acute abnormality. Skull: No evidence of calvarial fracture. Sinuses/Orbits: The visualized paranasal sinuses are essentially clear. Other: Partial right mastoid effusion. Clear left mastoid air cells. IMPRESSION: 1. No evidence of acute intracranial abnormality. No evidence of calvarial fracture. 2. Generalized cerebral volume loss and moderate  chronic small vessel ischemic changes in the cerebral white matter. 3. Partial right mastoid effusion. Electronically Signed   By: Ilona Sorrel M.D.   On: 07/10/2018 23:12   Ct Head Wo Contrast  Result Date: 07/09/2018 CLINICAL DATA:  Altered level of consciousness (LOC), unexplained. Seizure. EXAM: CT HEAD WITHOUT CONTRAST TECHNIQUE: Contiguous axial images were obtained from the base of the skull through the vertex without intravenous contrast. COMPARISON:  Head CT 06/04/2018 FINDINGS: Brain: Unchanged degree of atrophy and moderate to advanced chronic small vessel ischemia from prior exam. No intracranial hemorrhage, mass effect, or midline shift. No hydrocephalus. The basilar cisterns are patent. No evidence of territorial infarct or acute ischemia. No extra-axial or intracranial fluid collection. Vascular: No hyperdense vessel. Skull: No fracture or focal lesion. Sinuses/Orbits: Unchanged mild opacification of right mastoid air cells. No acute findings. Prior left cataract resection. Other: None. IMPRESSION: 1.  No acute intracranial abnormality. 2. Unchanged atrophy and chronic small vessel ischemia. Electronically Signed   By: Keith Rake M.D.   On: 07/09/2018 23:40   Dg Chest Port 1 View  Result Date: 07/11/2018 CLINICAL DATA:  Central line placement EXAM: PORTABLE CHEST 1 VIEW COMPARISON:  07/10/2018 FINDINGS: Interval placement of right internal jugular central line. The tip is in the SVC. NG tube enters the stomach. Endotracheal tube is stable. Moderate to large left effusion. Left lung airspace disease, most confluent in the left base, stable. No pneumothorax. IMPRESSION: Interval placement of right central line with the tip in the SVC. No pneumothorax. NG tube is in place which enters the stomach. Otherwise no change. Electronically Signed   By: Rolm Baptise M.D.   On: 07/11/2018 00:09   Dg Chest Port 1 View  Result Date: 07/10/2018 CLINICAL DATA:  Endotracheal tube adjustment. EXAM:  PORTABLE CHEST 1 VIEW COMPARISON:  Earlier this day at 2112 hour FINDINGS: The endotracheal tube tip is at the thoracic inlet 5.4 cm from the carina. Lower thorax/lung bases are not entirely included in the field of view. No significant change in left lung base opacity and pleural effusion. Unchanged cardiomediastinal contours. No pneumothorax. IMPRESSION: 1. Endotracheal tube tip at the thoracic inlet 5.4 cm from the carina. 2. Exam is otherwise unchanged from radiograph 12 minutes prior, allowing for lung bases not included in the field of view. Electronically Signed   By: Keith Rake M.D.   On: 07/10/2018 21:41   Dg Chest Port 1 View  Result Date: 07/10/2018 CLINICAL DATA:  Seizure. EXAM: PORTABLE CHEST 1 VIEW COMPARISON:  Radiograph of July 09, 2018. FINDINGS: Stable cardiomediastinal silhouette. Endotracheal tube is seen projected over tracheal air shadow with distal tip 10 cm above the carina. No pneumothorax is  noted. Left apical fluid is noted. Stable left basilar atelectasis or infiltrate is noted with associated pleural effusion. Mild right basilar subsegmental atelectasis is noted. Bony thorax is unremarkable. IMPRESSION: Stable left basilar atelectasis or infiltrate is noted with associated pleural effusion. Endotracheal tube appears to be projected over the trachea, but advancement by 3-4 cm is recommended. Electronically Signed   By: Marijo Conception, M.D.   On: 07/10/2018 21:35   Dg Chest Port 1 View  Result Date: 07/09/2018 CLINICAL DATA:  Cough. EXAM: PORTABLE CHEST 1 VIEW COMPARISON:  Chest radiograph 07/04/2018. Additional prior chest radiographs reviewed. FINDINGS: Left pleural effusion and basilar opacity is unchanged from recent exam. Progressive peribronchial thickening. No new airspace disease. Unchanged heart size and mediastinal contours. No pneumothorax. IMPRESSION: 1. Increased peribronchial thickening most suggestive of progressive pulmonary edema, atypical infection  felt less likely. 2. Unchanged left basilar opacity and pleural effusion from recent exam. Electronically Signed   By: Keith Rake M.D.   On: 07/09/2018 22:53   Dg Abd Portable 1v  Result Date: 07/11/2018 CLINICAL DATA:  OG tube placement. EXAM: PORTABLE ABDOMEN - 1 VIEW COMPARISON:  Chest radiograph earlier this day. FINDINGS: Tip of the enteric tube below the diaphragm in the stomach, side-port in the region of gastroesophageal junction. Gaseous gastric distension noted in the upper abdomen. No evidence of free air. IMPRESSION: Tip of the enteric tube below the diaphragm in the stomach. Side-port in the region of the gastroesophageal junction. Advancement of 5 cm would definitively place the side-port in the stomach. Gaseous gastric distension. Electronically Signed   By: Keith Rake M.D.   On: 07/11/2018 00:25   Medications: Infusions: . sodium chloride    . fentaNYL infusion INTRAVENOUS 200 mcg/hr (07/11/18 0700)  . nitroGLYCERIN 40 mcg/min (07/11/18 0700)  . dialysis replacement fluid (prismasate)    . dialysis replacement fluid (prismasate)    . dialysate (PRISMASATE) 2,000 mL/hr at 07/11/18 0528  . propofol (DIPRIVAN) infusion 30 mcg/kg/min (07/11/18 0700)  . propofol    . sodium chloride      Scheduled Medications: . amLODipine  5 mg Oral Daily  . bictegravir-emtricitabine-tenofovir AF  1 tablet Oral Daily  . calcium acetate  2,001 mg Oral TID WC  . carvedilol  25 mg Oral BID WC  . chlorhexidine gluconate (MEDLINE KIT)  15 mL Mouth Rinse BID  . famotidine  20 mg Per Tube BID  . fentaNYL      . fentaNYL      . heparin  5,000 Units Subcutaneous Q8H  . hydrALAZINE  100 mg Oral TID  . insulin aspart  1-3 Units Subcutaneous Q4H  . ketorolac  1 drop Left Eye QID  . lacosamide  150 mg Oral BID  . mouth rinse  15 mL Mouth Rinse 10 times per day  . midazolam      . midazolam      . multivitamin  1 tablet Oral Daily  . ofloxacin  1 drop Left Eye QID  . prednisoLONE  acetate  1 drop Left Eye QID  . sodium chloride flush  3 mL Intravenous Q12H    have reviewed scheduled and prn medications.  Physical Exam: General: intubated, sedated Heart: RRR Lungs: mostly clear- 30 % fio2 Abdomen: soft, non tender Extremities: no edema Dialysis Access: has left AVF- patent- currently running right IJ temp cath    07/11/2018,8:13 AM  LOS: 1 day

## 2018-07-11 NOTE — Progress Notes (Signed)
LTM EEG initiated.  Test pt button, staff educated.  Dr Leonel Ramsay aware.

## 2018-07-11 NOTE — Progress Notes (Signed)
NAME:  Jeffrey [name], MRN:  [id number], DOB:  1956-03-11, LOS: 1 ADMISSION DATE:  07/09/2018, CONSULTATION DATE:  07/09/18 REFERRING MD:  Bonner Puna, CHIEF COMPLAINT:  Seizure  Brief History   62 y/o male with multiple medical problems admitted for encephalopathy presumably related to uremia, had multiple seizures after admission.  Has ESRD and HIV.  Past Medical History  HIV, OSA, ESRD Significant Hospital Events   10/19 Admission/Seizure >   Consults: date of consult/date signed off & final recs:  Neurology Renal  Procedures (surgical and bedside):  PIV's ETT 10/20>>  Significant Diagnostic Tests:  Head CT (07/09/18): no acute process Head CT (07/10/18): pending CXR (07/10/18): no acute process  Micro Data:  Blood cx (07/10/18): pending Sputum cx (07/10/18): pending  Antimicrobials:  None  Subjective:  Started on HD overnight No further seizures Hasn't had wake up assessment yet   Objective   Blood pressure (!) 167/82, pulse (!) 54, temperature (!) 96 F (35.6 C), temperature source Axillary, resp. rate 14, height 6' (1.829 m), weight 78 kg, SpO2 100 %.    Vent Mode: PRVC FiO2 (%):  [30 %-50 %] 30 % Set Rate:  [16 bmp] 16 bmp Vt Set:  [620 mL] 620 mL PEEP:  [5 cmH20] 5 cmH20 Plateau Pressure:  [16 cmH20-17 cmH20] 17 cmH20   Intake/Output Summary (Last 24 hours) at 07/11/2018 1311 Last data filed at 07/11/2018 1300 Gross per 24 hour  Intake 970.1 ml  Output 975 ml  Net -4.9 ml   Filed Weights   07/10/18 2015 07/10/18 2130 07/11/18 0455  Weight: 77.5 kg 77.5 kg 78 kg    Examination: General:  In bed on vent HENT: NCAT ETT in place PULM: CTA B, vent supported breathing CV: RRR, no mgr GI: BS+, soft, nontender MSK: normal bulk and tone Neuro: sedated on vent    Resolved Hospital Problem list     Assessment & Plan:  Status epilepticus: no further seizures > continue Vimpat > holding keppra > monitor EEG, f/u neurology recs > MRI brain?  Acute  respiratory failure with hypoxemia: normal CXR/oxygenation > WUA/SBT now > VAP prevention  HTN emergency > SBP goal < 160 > add back low dose clonidine to prevent rebound, 1/3 home dose to prevent interfering with neuro exam > continue NTG gtt for now > continue amlodipine and coreg per home dosing > continue hydralazine  ESRD  > CVVHD per renal  Acute encephalopathy > wake up assessment now  Disposition / Summary of Today's Plan 07/11/18   Read above     Diet: start tube feeding Pain/Anxiety/Delirium protocol (if indicated): PAD protocol propofol and fentanyl VAP protocol (if indicated): yes DVT prophylaxis: hep GI prophylaxis: famotidine Hyperglycemia protocol: monitor glucose Mobility: bedrest Code Status: full Family Communication: none bedside  Labs   CBC: Recent Labs  Lab 07/05/18 0356 07/09/18 2149 07/10/18 0034 07/10/18 2049 07/11/18 0341  WBC 9.0 11.6* 11.3* 11.7* 6.8  HGB 10.3* 12.5* 11.2* 11.4* 9.6*  HCT 34.5* 39.1 36.1* 36.4* 30.5*  MCV 93.5 91.1 90.9 92.4 90.2  PLT 163 140* 174 160 115*    Basic Metabolic Panel: Recent Labs  Lab 07/05/18 0356 07/09/18 2149 07/10/18 0034 07/10/18 2049 07/11/18 0341  NA 136 137 138 135 140  K 3.9 4.8 5.1 4.3 4.3  CL 98 103 105 97* 102  CO2 21* 14* 14* 11* 16*  GLUCOSE 94 104* 103* 126* 81  BUN 75* 127* 131* 102* 100*  CREATININE 14.66* 21.35* 21.50* 17.53* 17.25*  CALCIUM 8.9 9.4 9.2 8.8* 8.5*  MG  --   --   --   --  1.9  PHOS  --   --   --   --  5.4*  5.4*   GFR: Estimated Creatinine Clearance: 4.7 mL/min (A) (by C-G formula based on SCr of 17.25 mg/dL (H)). Recent Labs  Lab 07/09/18 2149 07/10/18 0034 07/10/18 2049 07/10/18 2236 07/11/18 0341 07/11/18 1043  PROCALCITON  --   --   --  0.89 0.87  --   WBC 11.6* 11.3* 11.7*  --  6.8  --   LATICACIDVEN  --  1.6  --  4.7* 1.9 1.3    Liver Function Tests: Recent Labs  Lab 07/09/18 2149 07/10/18 0034 07/11/18 0341  AST 15 19  --   ALT <5  7  --   ALKPHOS 65 62  --   BILITOT 1.0 0.9  --   PROT 7.9 7.2  --   ALBUMIN 3.6 3.4* 3.0*   No results for input(s): LIPASE, AMYLASE in the last 168 hours. Recent Labs  Lab 07/10/18 0034 07/10/18 2236  AMMONIA 40* 38*    ABG    Component Value Date/Time   PHART 7.317 (L) 07/11/2018 0440   PCO2ART 38.0 07/11/2018 0440   PO2ART 154 (H) 07/11/2018 0440   HCO3 19.3 (L) 07/11/2018 0440   TCO2 29 05/01/2018 2132   ACIDBASEDEF 6.0 (H) 07/11/2018 0440   O2SAT 98.7 07/11/2018 0440     Coagulation Profile: No results for input(s): INR, PROTIME in the last 168 hours.  Cardiac Enzymes: Recent Labs  Lab 07/10/18 2236 07/11/18 0341 07/11/18 1043  TROPONINI 0.07* 0.09* 0.07*    HbA1C: Hgb A1c MFr Bld  Date/Time Value Ref Range Status  06/03/2011 05:45 AM 5.3 <5.7 % Final    Comment:    (NOTE)                                                                       According to the ADA Clinical Practice Recommendations for 2011, when HbA1c is used as a screening test:  >=6.5%   Diagnostic of Diabetes Mellitus           (if abnormal result is confirmed) 5.7-6.4%   Increased risk of developing Diabetes Mellitus References:Diagnosis and Classification of Diabetes Mellitus,Diabetes NUUV,2536,64(QIHKV 1):S62-S69 and Standards of Medical Care in         Diabetes - 2011,Diabetes Care,2011,34 (Suppl 1):S11-S61.    CBG: Recent Labs  Lab 07/10/18 1832 07/10/18 2128 07/11/18 0614 07/11/18 0722 07/11/18 1055  GLUCAP 86 113* 84 86 81      Critical care time: 35 minutes     Roselie Awkward, MD [place] [facility] Pager: [phone] Cell: [phone] After 3pm or if no response, call [phone]

## 2018-07-11 NOTE — Progress Notes (Addendum)
Reason for consult: Seizures  Subjective: Patient is intubated and on sedation.   ROS:  Unable to obtain due being sedated  Examination  Vital signs in last 24 hours: Temp:  [90.5 F (32.5 C)-100 F (37.8 C)] 96 F (35.6 C) (10/21 0800) Pulse Rate:  [50-130] 50 (10/21 1000) Resp:  [12-25] 14 (10/21 1000) BP: (112-245)/(59-119) 141/71 (10/21 1000) SpO2:  [95 %-100 %] 100 % (10/21 1000) FiO2 (%):  [30 %-50 %] 30 % (10/21 0803) Weight:  [77.5 kg-78 kg] 78 kg (10/21 0455)  General: lying in bed, intubated CVS: pulse-normal rate and rhythm RS: breathing comfortably Extremities: normal   Neuro: Mental Status: Patient intubated and sedated.  Is nonverbal, does not follow commands.  Does not track examiner Cranial Nerves: II: pupilsare reactive bilaterally with sluggish III,IV,VI: doll's response absent bilaterally.  V,VII: corneal reflex: Present bilaterally IX,X: gag reflex present  Motor: Extremities withdraws in all 4 extremities Sensory: Unable to assess Plantars: Mute Cerebellar: Unable to perform  Basic Metabolic Panel: Recent Labs  Lab 07/05/18 0356 07/09/18 2149 07/10/18 0034 07/10/18 2049 07/11/18 0341  NA 136 137 138 135 140  K 3.9 4.8 5.1 4.3 4.3  CL 98 103 105 97* 102  CO2 21* 14* 14* 11* 16*  GLUCOSE 94 104* 103* 126* 81  BUN 75* 127* 131* 102* 100*  CREATININE 14.66* 21.35* 21.50* 17.53* 17.25*  CALCIUM 8.9 9.4 9.2 8.8* 8.5*  MG  --   --   --   --  1.9  PHOS  --   --   --   --  5.4*  5.4*    CBC: Recent Labs  Lab 07/05/18 0356 07/09/18 2149 07/10/18 0034 07/10/18 2049 07/11/18 0341  WBC 9.0 11.6* 11.3* 11.7* 6.8  HGB 10.3* 12.5* 11.2* 11.4* 9.6*  HCT 34.5* 39.1 36.1* 36.4* 30.5*  MCV 93.5 91.1 90.9 92.4 90.2  PLT 163 140* 174 160 115*     Coagulation Studies: No results for input(s): LABPROT, INR in the last 72 hours.   EEG: Clinical interpretation: This 12 hours of intensive EEG monitoring with simultaneous video monitoring  document did not record any clinical subclinical seizures.  Background activities were abnormal due to reactive background activity slowing  suggestive of encephalopathy of nonspecific etiologies.  Clinical correlation is advised.    ASSESSMENT AND PLAN  62 year old male with end-stage renal disease presents with breakthrough seizures in the setting of severe hypertension and uremia. Overnight EEG negative for subclinical seizures.  Plan Wean sedation MRI Aaron Edelman  D/C LTM EEG  Continue Vimpat 150 mg BID  Will continue to follow.    Karena Addison Leeon Makar [facility] Pager Number [phone] For questions after 7pm please refer to AMION to reach the Neurologist on call

## 2018-07-11 NOTE — Progress Notes (Signed)
Sputum sample obtained and sent to lab by this RT.

## 2018-07-11 NOTE — Procedures (Signed)
Electroencephalography report.  Long-term monitoring.  Recording begins 07/10/2018 at 2344 Recording ends 07/11/2018 at 1223  Day 1  CPT 95951  Date acquisition: [employer] 10-20 for electrodes placement.  18 channels EEG with additional EKG channel  This EEG was requested for this patient with end-stage renal disease dialysis and suspected convulsions.  Background activities marked by continuous 4 to 5 cps background activity slowing distributed broadly.  There is some paracentral and anterior dominance.  However there was no obvious interhemispheric asymmetries.  There is no interictal epileptiform discharges clinical subclinical seizures present.  Background activities were reactive.  Multiple pushbutton activation events were noted most recently at 10 AM at 12 AM and 12:40 AM.  Description is not available of clinical manifestations.  Electrographically this times accompanied by increasing muscle artifact on EEG however underlying background activities would not change to suggest seizures.  Clinical interpretation: This 12 hours of intensive EEG monitoring with simultaneous video monitoring document did not record any clinical subclinical seizures.  Background activities were abnormal due to reactive background activity slowing  suggestive of encephalopathy of nonspecific etiologies.  Clinical correlation is advised.

## 2018-07-11 NOTE — Procedures (Signed)
Hemodialysis Catheter Insertion Procedure Note Jeffrey [name] [id number] September 07, 1956  Procedure: Insertion of Hemodialysis Catheter Indications: Hemodialysis  Procedure Details Consent: Unable to obtain consent because of emergent medical necessity.  Time Out: Verified patient identification, verified procedure, site/side was marked, verified correct patient position, special equipment/implants available, medications/allergies/relevent history reviewed, required imaging and test results available.  Performed  Maximum sterile technique was used including antiseptics, cap, gloves, gown, hand hygiene, mask and sheet.  Skin prep: Chlorhexidine; local anesthetic administered  A Trialysis HD catheter was placed in the right internal jugular vein using the Seldinger technique.  Evaluation Blood flow good Complications: No apparent complications Patient did tolerate procedure well. Chest X-ray ordered to verify placement.  CXR: pending.   Procedure performed with ultrasound guidance for real time vessel cannulation.     Hayden Pedro, AG-ACNP [facility]  PCCM Pgr: [phone]

## 2018-07-11 NOTE — Progress Notes (Signed)
LTM EEG maintenance completed. Gelled all electrodes as needed.

## 2018-07-12 ENCOUNTER — Inpatient Hospital Stay (HOSPITAL_COMMUNITY): Payer: Medicare Other

## 2018-07-12 LAB — GLUCOSE, CAPILLARY
GLUCOSE-CAPILLARY: 99 mg/dL (ref 70–99)
GLUCOSE-CAPILLARY: 109 mg/dL — AB (ref 70–99)
GLUCOSE-CAPILLARY: 102 mg/dL — AB (ref 70–99)
GLUCOSE-CAPILLARY: 128 mg/dL — AB (ref 70–99)
GLUCOSE-CAPILLARY: 146 mg/dL — AB (ref 70–99)
Glucose-Capillary: 111 mg/dL — ABNORMAL HIGH (ref 70–99)

## 2018-07-12 LAB — RENAL FUNCTION PANEL
ANION GAP: 9 (ref 5–15)
Albumin: 2.6 g/dL — ABNORMAL LOW (ref 3.5–5.0)
BUN: 35 mg/dL — ABNORMAL HIGH (ref 8–23)
CALCIUM: 8.2 mg/dL — AB (ref 8.9–10.3)
CO2: 24 mmol/L (ref 22–32)
Chloride: 104 mmol/L (ref 98–111)
Creatinine, Ser: 6.7 mg/dL — ABNORMAL HIGH (ref 0.61–1.24)
GFR calc non Af Amer: 8 mL/min — ABNORMAL LOW (ref >60)
GFR, EST AFRICAN AMERICAN: 9 mL/min — AB (ref >60)
Glucose, Bld: 105 mg/dL — ABNORMAL HIGH (ref 70–99)
PHOSPHORUS: 2.3 mg/dL — AB (ref 2.5–4.6)
Potassium: 4.3 mmol/L (ref 3.5–5.1)
SODIUM: 137 mmol/L (ref 135–145)

## 2018-07-12 LAB — PROCALCITONIN: Procalcitonin: 0.62 ng/mL

## 2018-07-12 LAB — MAGNESIUM: MAGNESIUM: 2.2 mg/dL (ref 1.7–2.4)

## 2018-07-12 MED ORDER — SODIUM CHLORIDE 0.9 % IV SOLN
4.0000 mg | Freq: Four times a day (QID) | INTRAVENOUS | Status: DC
  Administered 2018-07-12 – 2018-07-13 (×4): 4 mg via INTRAVENOUS
  Filled 2018-07-12 (×5): qty 0.4

## 2018-07-12 MED ORDER — CLONIDINE HCL 0.1 MG PO TABS
0.3000 mg | ORAL_TABLET | Freq: Three times a day (TID) | ORAL | Status: DC
  Administered 2018-07-12 – 2018-07-19 (×20): 0.3 mg via ORAL
  Filled 2018-07-12 (×7): qty 1
  Filled 2018-07-12: qty 3
  Filled 2018-07-12 (×12): qty 1

## 2018-07-12 MED ORDER — DARBEPOETIN ALFA 60 MCG/0.3ML IJ SOSY
60.0000 ug | PREFILLED_SYRINGE | INTRAMUSCULAR | Status: DC
  Administered 2018-07-13: 60 ug via INTRAVENOUS
  Filled 2018-07-12 (×2): qty 0.3

## 2018-07-12 MED ORDER — CHLORHEXIDINE GLUCONATE CLOTH 2 % EX PADS
6.0000 | MEDICATED_PAD | Freq: Every day | CUTANEOUS | Status: DC
  Administered 2018-07-12 – 2018-07-17 (×4): 6 via TOPICAL

## 2018-07-12 NOTE — Progress Notes (Addendum)
Reason for consult:   Subjective: patient intubated.  He is more awake and tracking.  Still not following commands.  Undergoing CRRT   ROS: Unable to obtain due to poor mental status  Examination  Vital signs in last 24 hours: Temp:  [98.4 F (36.9 C)-100.3 F (37.9 C)] 98.4 F (36.9 C) (10/22 1926) Pulse Rate:  [58-82] 58 (10/22 1923) Resp:  [16-17] 16 (10/22 1923) BP: (105-246)/(49-91) 124/71 (10/22 1900) SpO2:  [97 %-100 %] 100 % (10/22 1923) FiO2 (%):  [30 %] 30 % (10/22 1923) Weight:  [78.5 kg] 78.5 kg (10/22 0500)  General: lying in bed CVS: pulse-normal rate and rhythm RS: breathing comfortably Extremities: normal   Neuro: MS: Awake, tracks examiner CN: pupils equal and reactive, face appears symmetric Motor: Withdraws to noxious stimulus with good strength in all 4 extremities Plantars: Downgoing bilaterally Gait: not tested  Basic Metabolic Panel: Recent Labs  Lab 07/10/18 0034 07/10/18 2049 07/11/18 0341 07/11/18 1043 07/11/18 1726 07/12/18 0417  NA 138 135 140  --  136 137  K 5.1 4.3 4.3  --  3.9 4.3  CL 105 97* 102  --  105 104  CO2 14* 11* 16*  --  22 24  GLUCOSE 103* 126* 81  --  82 105*  BUN 131* 102* 100*  --  56* 35*  CREATININE 21.50* 17.53* 17.25*  --  10.13* 6.70*  CALCIUM 9.2 8.8* 8.5*  --  8.2* 8.2*  MG  --   --  1.9 1.9 2.0 2.2  PHOS  --   --  5.4*  5.4* 3.6 3.2  3.2 2.3*    CBC: Recent Labs  Lab 07/09/18 2149 07/10/18 0034 07/10/18 2049 07/11/18 0341  WBC 11.6* 11.3* 11.7* 6.8  HGB 12.5* 11.2* 11.4* 9.6*  HCT 39.1 36.1* 36.4* 30.5*  MCV 91.1 90.9 92.4 90.2  PLT 140* 174 160 115*     Coagulation Studies: No results for input(s): LABPROT, INR in the last 72 hours.  Imaging Reviewed:  MRI brain: Small acute left frontal lobe infarct  This 2 of intensive EEG monitoring with simultaneous video monitoring document did not record any clinical subclinical seizures.  Background activities were abnormal due to reactive  background activity slowing  suggestive of encephalopathy of nonspecific etiologies.  Clinical correlation is advised.    ASSESSMENT AND PLAN  62 year old male with end-stage renal disease presents with breakthrough seizures in the setting of severe hypertension and uremia.  Overnight EEG was negative for subclinical seizures. Brain was obtained today showed small left frontal lobe infarct.   Seizure Continue Vimpat 150 mg BID Continue treat uremia and metabolic/electrolyte abnormalities  Left frontal lobe acute infarct - likely incidental: Most likely from hypertension and fluctuating blood pressures.Other etiology includes cardioembolic vs atheroembolic.  Recommend Transthoracic echocardiogram MR Angiogram of head Carotid Doppler  Aspirin 81 mg daily Lipitor 40 mg daily Frequent Neurochecks Stroke swallow evaluation after extubation BP goal: avoid hypotension, goal is gradual reduction to normotension   Stroke team to follow.     Karena Addison Alana Dayton [facility] Pager Number [phone] For questions after 7pm please refer to AMION to reach the Neurologist on call

## 2018-07-12 NOTE — Progress Notes (Signed)
NAME:  Jeffrey [name], MRN:  [id number], DOB:  05/13/1956, LOS: 2 ADMISSION DATE:  07/09/2018, CONSULTATION DATE:  07/09/18 REFERRING MD:  Bonner Puna, CHIEF COMPLAINT:  Seizure  Brief History   62 y/o male with multiple medical problems admitted for encephalopathy presumably related to uremia, had multiple seizures after admission.  Has ESRD and HIV.  Past Medical History  HIV, OSA, ESRD Significant Hospital Events   10/19 Admission/Seizure >   Consults: date of consult/date signed off & final recs:  Neurology Renal  Procedures (surgical and bedside):  PIV's ETT 10/20>>  Significant Diagnostic Tests:  Head CT (07/09/18): no acute process Head CT (07/10/18): pending CXR (07/10/18): no acute process Continuous EEG 10/20: no evidence of subclinical seizure  Micro Data:  Blood cx (07/10/18): pending Sputum cx (07/10/18): pending  Antimicrobials:  None  Subjective:  Remained on CVVHD overnight Still sleepy but more awake Tongue swollen  Objective   Blood pressure (!) 173/66, pulse 71, temperature 99.7 F (37.6 C), temperature source Oral, resp. rate 16, height 6' (1.829 m), weight 78.5 kg, SpO2 99 %.    Vent Mode: PRVC FiO2 (%):  [30 %] 30 % Set Rate:  [16 bmp] 16 bmp Vt Set:  [578 mL] 620 mL PEEP:  [5 cmH20] 5 cmH20 Plateau Pressure:  [19 cmH20-28 cmH20] 28 cmH20   Intake/Output Summary (Last 24 hours) at 07/12/2018 1200 Last data filed at 07/12/2018 1100 Gross per 24 hour  Intake 1521.9 ml  Output 1236 ml  Net 285.9 ml   Filed Weights   07/10/18 2130 07/11/18 0455 07/12/18 0500  Weight: 77.5 kg 78 kg 78.5 kg    Examination:  General:  In bed on vent HENT: NCAT ETT in place massive tongue swelling PULM: CTA B, vent supported breathing CV: RRR, no mgr GI: BS+, soft, nontender MSK: normal bulk and tone Neuro: sedated on vent    Resolved Hospital Problem list     Assessment & Plan:  Status epilepticus: no further seizures > continue Vimpat > hold  keppra > f/u neuro recs  Acute encephalopathy : improving, presumably this is still uremia > MRI brain today > minimize sedation  Acute respiratory failure with hypoxemia: normal CXR/oxygenation > WUA/SBT > VAP prevention  HTN emergency > SBP goal < 160 > increase clonidine to home dose > continue amlodipine, coreg and hydralazine > wean off nitro gtt  ESRD  > HD per renal, holding CVVHD  Acute encephalopathy > minimize sedation  Tongue swelling: new problem > add decadron  Disposition / Summary of Today's Plan 07/12/18   Read above     Diet: start tube feeding Pain/Anxiety/Delirium protocol (if indicated): PAD protocol propofol and fentanyl VAP protocol (if indicated): yes DVT prophylaxis: hep GI prophylaxis: famotidine Hyperglycemia protocol: monitor glucose Mobility: bedrest Code Status: full Family Communication: none bedside  Labs   CBC: Recent Labs  Lab 07/09/18 2149 07/10/18 0034 07/10/18 2049 07/11/18 0341  WBC 11.6* 11.3* 11.7* 6.8  HGB 12.5* 11.2* 11.4* 9.6*  HCT 39.1 36.1* 36.4* 30.5*  MCV 91.1 90.9 92.4 90.2  PLT 140* 174 160 115*    Basic Metabolic Panel: Recent Labs  Lab 07/10/18 0034 07/10/18 2049 07/11/18 0341 07/11/18 1043 07/11/18 1726 07/12/18 0417  NA 138 135 140  --  136 137  K 5.1 4.3 4.3  --  3.9 4.3  CL 105 97* 102  --  105 104  CO2 14* 11* 16*  --  22 24  GLUCOSE 103* 126* 81  --  82 105*  BUN 131* 102* 100*  --  56* 35*  CREATININE 21.50* 17.53* 17.25*  --  10.13* 6.70*  CALCIUM 9.2 8.8* 8.5*  --  8.2* 8.2*  MG  --   --  1.9 1.9 2.0 2.2  PHOS  --   --  5.4*  5.4* 3.6 3.2  3.2 2.3*   GFR: Estimated Creatinine Clearance: 12.2 mL/min (A) (by C-G formula based on SCr of 6.7 mg/dL (H)). Recent Labs  Lab 07/09/18 2149 07/10/18 0034 07/10/18 2049 07/10/18 2236 07/11/18 0341 07/11/18 1043 07/12/18 0417  PROCALCITON  --   --   --  0.89 0.87  --  0.62  WBC 11.6* 11.3* 11.7*  --  6.8  --   --   LATICACIDVEN  --   1.6  --  4.7* 1.9 1.3  --     Liver Function Tests: Recent Labs  Lab 07/09/18 2149 07/10/18 0034 07/11/18 0341 07/11/18 1726 07/12/18 0417  AST 15 19  --   --   --   ALT <5 7  --   --   --   ALKPHOS 65 62  --   --   --   BILITOT 1.0 0.9  --   --   --   PROT 7.9 7.2  --   --   --   ALBUMIN 3.6 3.4* 3.0* 2.8* 2.6*   No results for input(s): LIPASE, AMYLASE in the last 168 hours. Recent Labs  Lab 07/10/18 0034 07/10/18 2236  AMMONIA 40* 38*    ABG    Component Value Date/Time   PHART 7.317 (L) 07/11/2018 0440   PCO2ART 38.0 07/11/2018 0440   PO2ART 154 (H) 07/11/2018 0440   HCO3 19.3 (L) 07/11/2018 0440   TCO2 29 05/01/2018 2132   ACIDBASEDEF 6.0 (H) 07/11/2018 0440   O2SAT 98.7 07/11/2018 0440     Coagulation Profile: No results for input(s): INR, PROTIME in the last 168 hours.  Cardiac Enzymes: Recent Labs  Lab 07/10/18 2236 07/11/18 0341 07/11/18 1043  TROPONINI 0.07* 0.09* 0.07*    HbA1C: Hgb A1c MFr Bld  Date/Time Value Ref Range Status  06/03/2011 05:45 AM 5.3 <5.7 % Final    Comment:    (NOTE)                                                                       According to the ADA Clinical Practice Recommendations for 2011, when HbA1c is used as a screening test:  >=6.5%   Diagnostic of Diabetes Mellitus           (if abnormal result is confirmed) 5.7-6.4%   Increased risk of developing Diabetes Mellitus References:Diagnosis and Classification of Diabetes Mellitus,Diabetes MBWG,6659,93(TTSVX 1):S62-S69 and Standards of Medical Care in         Diabetes - 2011,Diabetes Care,2011,34 (Suppl 1):S11-S61.    CBG: Recent Labs  Lab 07/11/18 1500 07/11/18 1923 07/11/18 2309 07/12/18 0346 07/12/18 0728  GLUCAP 87 86 91 99 111*      Critical care time: 35 minutes     Roselie Awkward, MD [place] Pager: [phone] Cell: [phone] After 3pm or if no response, call [phone]

## 2018-07-12 NOTE — Progress Notes (Signed)
Subjective:  Monitored and cont EEG has not shown sz activity.  Remains on CRRT - has had spikes of BP - more alert  Objective Vital signs in last 24 hours: Vitals:   07/12/18 0700 07/12/18 0709 07/12/18 0731 07/12/18 0800  BP: 131/60 131/60  (!) 182/75  Pulse: 68 74  66  Resp: _0 Temp:   99.7 F (37.6 C)   TempSrc:   Oral   SpO2: 98% 98%  99%  Weight:      Height:       Weight change: 0.5 kg  Intake/Output Summary (Last 24 hours) at 07/12/2018 0856 Last data filed at 07/12/2018 0800 Gross per 24 hour  Intake 1758.07 ml  Output 1417 ml  Net 341.07 ml    Dialysis Orders:  Home HD - 4d/week, 3.5hr, EDW 76kg, AVF with buttonholes  Assessment/Plan: 1.  Seizure: Known seizure d/o - on Vimpat prior to admit, likely worsened by uremia. CT neg for acute finding. Neuro involved- EEG has not shown sz on meds  2.  AMS: MRI pending. Likely related to #3. Now uremia is better- is still on sedation - maybe planning to wake up today  3.  ESRD: Usual home HD - last dialyzed 10/12 at home, BUN/Cr high.  Tried to do IHD on 10/20- had sz- then moved to ICU for CRRT. Running well from 10/20- - I think OK to stop CRRT today and proceed with IHD tomorrow 4.  Hypertension/volume:  BP was very high, no peripheral edema  Controlled this AM- norvasc, coreg, clonidine, hydralazine- also iv ntg but being weaned and fentanyl- have not really removed too much volume but doesn't seem to have any - will reassess with HD tomorrow if needs volume off  5.  Anemia: Hgb 11.2, dropped to the 9/s, add ESA- check iron 6.  Metabolic bone disease: Ca ok, Phos 5.4, now low. Can resume home binders once more aware and eating if needed at that time. 7.  Hep B + 8.  HIV    Louis Meckel    Labs: Basic Metabolic Panel: Recent Labs  Lab 07/11/18 0341 07/11/18 1043 07/11/18 1726 07/12/18 0417  NA 140  --  136 137  K 4.3  --  3.9 4.3  CL 102  --  105 104  CO2 16*  --  22 24  GLUCOSE 81  --   82 105*  BUN 100*  --  56* 35*  CREATININE 17.25*  --  10.13* 6.70*  CALCIUM 8.5*  --  8.2* 8.2*  PHOS 5.4*  5.4* 3.6 3.2  3.2 2.3*   Liver Function Tests: Recent Labs  Lab 07/09/18 2149 07/10/18 0034 07/11/18 0341 07/11/18 1726 07/12/18 0417  AST 15 19  --   --   --   ALT <5 7  --   --   --   ALKPHOS 65 62  --   --   --   BILITOT 1.0 0.9  --   --   --   PROT 7.9 7.2  --   --   --   ALBUMIN 3.6 3.4* 3.0* 2.8* 2.6*   No results for input(s): LIPASE, AMYLASE in the last 168 hours. Recent Labs  Lab 07/10/18 0034 07/10/18 2236  AMMONIA 40* 38*   CBC: Recent Labs  Lab 07/09/18 2149 07/10/18 0034 07/10/18 2049 07/11/18 0341  WBC 11.6* 11.3* 11.7* 6.8  HGB 12.5* 11.2* 11.4* 9.6*  HCT 39.1 36.1* 36.4* 30.5*  MCV  91.1 90.9 92.4 90.2  PLT 140* 174 160 115*   Cardiac Enzymes: Recent Labs  Lab 07/10/18 2236 07/11/18 0341 07/11/18 1043  TROPONINI 0.07* 0.09* 0.07*   CBG: Recent Labs  Lab 07/11/18 1500 07/11/18 1923 07/11/18 2309 07/12/18 0346 07/12/18 0728  GLUCAP 87 86 91 99 111*    Iron Studies: No results for input(s): IRON, TIBC, TRANSFERRIN, FERRITIN in the last 72 hours. Studies/Results: Ct Head Wo Contrast  Result Date: 07/10/2018 CLINICAL DATA:  Seizure.  HIV.  Inpatient. EXAM: CT HEAD WITHOUT CONTRAST TECHNIQUE: Contiguous axial images were obtained from the base of the skull through the vertex without intravenous contrast. COMPARISON:  07/09/2018 head CT. FINDINGS: Brain: No evidence of parenchymal hemorrhage or extra-axial fluid collection. No mass lesion, mass effect, or midline shift. No CT evidence of acute infarction. Nonspecific moderate subcortical and periventricular white matter hypodensity, most in keeping with chronic small vessel ischemic change. Generalized cerebral volume loss. Cerebral ventricle sizes are stable and concordant with the degree of cerebral volume loss. Vascular: No acute abnormality. Skull: No evidence of calvarial  fracture. Sinuses/Orbits: The visualized paranasal sinuses are essentially clear. Other: Partial right mastoid effusion. Clear left mastoid air cells. IMPRESSION: 1. No evidence of acute intracranial abnormality. No evidence of calvarial fracture. 2. Generalized cerebral volume loss and moderate chronic small vessel ischemic changes in the cerebral white matter. 3. Partial right mastoid effusion. Electronically Signed   By: Ilona Sorrel M.D.   On: 07/10/2018 23:12   Dg Chest Port 1 View  Result Date: 07/12/2018 CLINICAL DATA:  Acute respiratory failure with hypoxemia. EXAM: PORTABLE CHEST 1 VIEW COMPARISON:  Radiograph July 10, 2018. FINDINGS: Stable cardiomediastinal silhouette. Endotracheal nasogastric tubes are unchanged in position. Right internal jugular catheter is unchanged in position. No pneumothorax is noted. Minimal right basilar subsegmental atelectasis is noted. Mild left basilar atelectasis or infiltrate is noted with associated pleural effusion. Severe degenerative changes seen involving the left glenohumeral joint. IMPRESSION: Stable support apparatus. Mild left basilar atelectasis or infiltrate is noted with associated pleural effusion. Electronically Signed   By: Marijo Conception, M.D.   On: 07/12/2018 07:37   Dg Chest Port 1 View  Result Date: 07/11/2018 CLINICAL DATA:  Central line placement EXAM: PORTABLE CHEST 1 VIEW COMPARISON:  07/10/2018 FINDINGS: Interval placement of right internal jugular central line. The tip is in the SVC. NG tube enters the stomach. Endotracheal tube is stable. Moderate to large left effusion. Left lung airspace disease, most confluent in the left base, stable. No pneumothorax. IMPRESSION: Interval placement of right central line with the tip in the SVC. No pneumothorax. NG tube is in place which enters the stomach. Otherwise no change. Electronically Signed   By: Rolm Baptise M.D.   On: 07/11/2018 00:09   Dg Chest Port 1 View  Result Date:  07/10/2018 CLINICAL DATA:  Endotracheal tube adjustment. EXAM: PORTABLE CHEST 1 VIEW COMPARISON:  Earlier this day at 2112 hour FINDINGS: The endotracheal tube tip is at the thoracic inlet 5.4 cm from the carina. Lower thorax/lung bases are not entirely included in the field of view. No significant change in left lung base opacity and pleural effusion. Unchanged cardiomediastinal contours. No pneumothorax. IMPRESSION: 1. Endotracheal tube tip at the thoracic inlet 5.4 cm from the carina. 2. Exam is otherwise unchanged from radiograph 12 minutes prior, allowing for lung bases not included in the field of view. Electronically Signed   By: Keith Rake M.D.   On: 07/10/2018 21:41   Dg  Chest Port 1 View  Result Date: 07/10/2018 CLINICAL DATA:  Seizure. EXAM: PORTABLE CHEST 1 VIEW COMPARISON:  Radiograph of July 09, 2018. FINDINGS: Stable cardiomediastinal silhouette. Endotracheal tube is seen projected over tracheal air shadow with distal tip 10 cm above the carina. No pneumothorax is noted. Left apical fluid is noted. Stable left basilar atelectasis or infiltrate is noted with associated pleural effusion. Mild right basilar subsegmental atelectasis is noted. Bony thorax is unremarkable. IMPRESSION: Stable left basilar atelectasis or infiltrate is noted with associated pleural effusion. Endotracheal tube appears to be projected over the trachea, but advancement by 3-4 cm is recommended. Electronically Signed   By: Marijo Conception, M.D.   On: 07/10/2018 21:35   Dg Abd Portable 1v  Result Date: 07/11/2018 CLINICAL DATA:  OG tube placement. EXAM: PORTABLE ABDOMEN - 1 VIEW COMPARISON:  Chest radiograph earlier this day. FINDINGS: Tip of the enteric tube below the diaphragm in the stomach, side-port in the region of gastroesophageal junction. Gaseous gastric distension noted in the upper abdomen. No evidence of free air. IMPRESSION: Tip of the enteric tube below the diaphragm in the stomach. Side-port in  the region of the gastroesophageal junction. Advancement of 5 cm would definitively place the side-port in the stomach. Gaseous gastric distension. Electronically Signed   By: Keith Rake M.D.   On: 07/11/2018 00:25   Medications: Infusions: . sodium chloride 10 mL/hr at 07/12/18 0800  . feeding supplement (VITAL 1.5 CAL) 35 mL/hr at 07/12/18 0700  . fentaNYL infusion INTRAVENOUS Stopped (07/12/18 0740)  . nitroGLYCERIN 30 mcg/min (07/12/18 0837)  . dialysis replacement fluid (prismasate) 400 mL/hr at 07/11/18 2210  . dialysis replacement fluid (prismasate) 200 mL/hr at 07/11/18 1853  . dialysate (PRISMASATE) 2,000 mL/hr at 07/12/18 0747  . propofol (DIPRIVAN) infusion Stopped (07/11/18 1324)  . sodium chloride      Scheduled Medications: . amLODipine  5 mg Oral Daily  . bictegravir-emtricitabine-tenofovir AF  1 tablet Oral Daily  . calcium acetate  2,001 mg Oral TID WC  . carvedilol  25 mg Oral BID WC  . chlorhexidine gluconate (MEDLINE KIT)  15 mL Mouth Rinse BID  . cloNIDine  0.1 mg Oral TID  . famotidine  20 mg Per Tube Daily  . feeding supplement (PRO-STAT SUGAR FREE 64)  60 mL Per Tube BID  . heparin  5,000 Units Subcutaneous Q8H  . hydrALAZINE  100 mg Oral TID  . insulin aspart  1-3 Units Subcutaneous Q4H  . ketorolac  1 drop Left Eye QID  . lacosamide  150 mg Oral BID  . mouth rinse  15 mL Mouth Rinse 10 times per day  . multivitamin  1 tablet Oral Daily  . ofloxacin  1 drop Left Eye QID  . prednisoLONE acetate  1 drop Left Eye QID  . sodium chloride flush  3 mL Intravenous Q12H    have reviewed scheduled and prn medications.  Physical Exam: General: intubated, more alert this AM Heart: RRR Lungs: mostly clear- 30 % fio2 Abdomen: soft, non tender Extremities: no edema Dialysis Access: has left AVF- patent- currently running right IJ temp cath    07/12/2018,8:56 AM  LOS: 2 days

## 2018-07-12 NOTE — Consult Note (Signed)
   [facility] Inpatient Consult   07/12/2018  Jeffrey [name] October 20, 1955 [id number]  Patient was assessed for [facility] for community services. Patient was outreached by [facility] nurse without success.  Patient is currently in ICU.  Spoke with Steffanie Dunn, RNCM regarding multiple hospitalizations and attempts to engage patient.  Chart review reveals from History and Physical on 07/09/18 [Dr. Kim] Jeffrey [name]  is a 62 y.o. male, w Hiv, Hepatitis B, Anemia/ Thrombocytopenia, OSA,  Seizure disorder, Aortic stenosis, ESRD on HD (home dialysis 4 times per week), apparently has not had dialysis since last Saturday apparently per family due to L AVF placement, and eye surgery ?Marland Kitchen  Pt has apparently been somnolent and not quite himself for the past 1 week.  Will follow for progress and disposition needs.    Of note, [facility] Care Management services does not replace or interfere with any services that are arranged by inpatient case management or social work. For additional questions or referrals please contact:  Natividad Brood, RN BSN [facility] Liaison  [phone] business mobile phone Toll free office [phone]

## 2018-07-12 NOTE — Procedures (Signed)
Electroencephalography report.  Long-term monitoring.  Recording begins 07/11/2018 at 12 23  Recording ends 07/12/2018 at 07 30   Day 2  CPT 95951  Date acquisition: [employer] 10-20 for electrodes placement.  18 channels EEG with additional EKG channel  This EEG was requested for this patient with end-stage renal disease dialysis and suspected convulsions.  Day 1 Background activities marked by continuous 4 to 5 cps background activity slowing distributed broadly.  There is some paracentral and anterior dominance.  However there was no obvious interhemispheric asymmetries.  There is no interictal epileptiform discharges clinical subclinical seizures present.  Background activities were reactive.  Multiple pushbutton activation events were noted most recently at 10 AM at 12 AM and 12:40 AM.  Description is not available of clinical manifestations.  Electrographically this times accompanied by increasing muscle artifact on EEG however underlying background activities would not change to suggest seizures.  Day 2 : No clinical subclinical seizures.  Background activities marked by predominantly delta slowing without significant interhemispheric asymmetries.  With external stimuli delta slowing become more rhythmic synchronized with anterior dominance ranges between 1 to 2 cps most consistent with stimulus induced rhythmic periodic discharges SIRPIDs and are benign variant in critically ill patients.  Clinical interpretation: This 2 of intensive EEG monitoring with simultaneous video monitoring document did not record any clinical subclinical seizures.  Background activities were abnormal due to reactive background activity slowing  suggestive of encephalopathy of nonspecific etiologies.  Clinical correlation is advised.

## 2018-07-12 NOTE — Progress Notes (Signed)
Transported pt to and from 3M02 to MRI on vent. Pt placed on MRI compatible vent for procedure with no complications. VS within normal limits.

## 2018-07-12 NOTE — Progress Notes (Signed)
LTM EEG D/C'd per Dr Lorraine Lax. No skin breakdown noted.

## 2018-07-13 ENCOUNTER — Inpatient Hospital Stay (HOSPITAL_COMMUNITY): Payer: Medicare Other

## 2018-07-13 ENCOUNTER — Ambulatory Visit: Payer: Medicare Other | Admitting: Sports Medicine

## 2018-07-13 DIAGNOSIS — I6330 Cerebral infarction due to thrombosis of unspecified cerebral artery: Secondary | ICD-10-CM

## 2018-07-13 LAB — RENAL FUNCTION PANEL
Albumin: 2.7 g/dL — ABNORMAL LOW (ref 3.5–5.0)
Albumin: 2.8 g/dL — ABNORMAL LOW (ref 3.5–5.0)
Anion gap: 9 (ref 5–15)
Anion gap: 8 (ref 5–15)
BUN: 54 mg/dL — ABNORMAL HIGH (ref 8–23)
BUN: 21 mg/dL (ref 8–23)
CALCIUM: 8.6 mg/dL — AB (ref 8.9–10.3)
CHLORIDE: 102 mmol/L (ref 98–111)
CHLORIDE: 100 mmol/L (ref 98–111)
CO2: 25 mmol/L (ref 22–32)
CO2: 28 mmol/L (ref 22–32)
CREATININE: 7.5 mg/dL — AB (ref 0.61–1.24)
CREATININE: 3.81 mg/dL — AB (ref 0.61–1.24)
Calcium: 8.6 mg/dL — ABNORMAL LOW (ref 8.9–10.3)
GFR calc Af Amer: 18 mL/min — ABNORMAL LOW (ref >60)
GFR calc non Af Amer: 7 mL/min — ABNORMAL LOW (ref >60)
GFR calc non Af Amer: 15 mL/min — ABNORMAL LOW (ref >60)
GFR, EST AFRICAN AMERICAN: 8 mL/min — AB (ref >60)
GLUCOSE: 144 mg/dL — AB (ref 70–99)
GLUCOSE: 126 mg/dL — AB (ref 70–99)
PHOSPHORUS: 2 mg/dL — AB (ref 2.5–4.6)
POTASSIUM: 3.2 mmol/L — AB (ref 3.5–5.1)
Phosphorus: 2.8 mg/dL (ref 2.5–4.6)
Potassium: 4.9 mmol/L (ref 3.5–5.1)
SODIUM: 136 mmol/L (ref 135–145)
Sodium: 136 mmol/L (ref 135–145)

## 2018-07-13 LAB — CBC WITH DIFFERENTIAL/PLATELET
Abs Immature Granulocytes: 0.01 10*3/uL (ref 0.00–0.07)
Basophils Absolute: 0 10*3/uL (ref 0.0–0.1)
Basophils Relative: 0 %
EOS ABS: 0 10*3/uL (ref 0.0–0.5)
Eosinophils Relative: 0 %
HCT: 26.1 % — ABNORMAL LOW (ref 39.0–52.0)
Hemoglobin: 7.8 g/dL — ABNORMAL LOW (ref 13.0–17.0)
IMMATURE GRANULOCYTES: 0 %
Lymphocytes Relative: 12 %
Lymphs Abs: 0.5 10*3/uL — ABNORMAL LOW (ref 0.7–4.0)
MCH: 28.1 pg (ref 26.0–34.0)
MCHC: 29.9 g/dL — ABNORMAL LOW (ref 30.0–36.0)
MCV: 93.9 fL (ref 80.0–100.0)
MONO ABS: 0.2 10*3/uL (ref 0.1–1.0)
MONOS PCT: 4 %
NEUTROS PCT: 84 %
Neutro Abs: 3.8 10*3/uL (ref 1.7–7.7)
PLATELETS: 79 10*3/uL — AB (ref 150–400)
RBC: 2.78 MIL/uL — AB (ref 4.22–5.81)
RDW: 16.8 % — ABNORMAL HIGH (ref 11.5–15.5)
WBC: 4.5 10*3/uL (ref 4.0–10.5)
nRBC: 0 % (ref 0.0–0.2)

## 2018-07-13 LAB — GLUCOSE, CAPILLARY
GLUCOSE-CAPILLARY: 150 mg/dL — AB (ref 70–99)
GLUCOSE-CAPILLARY: 163 mg/dL — AB (ref 70–99)
GLUCOSE-CAPILLARY: 150 mg/dL — AB (ref 70–99)
GLUCOSE-CAPILLARY: 132 mg/dL — AB (ref 70–99)
Glucose-Capillary: 109 mg/dL — ABNORMAL HIGH (ref 70–99)
Glucose-Capillary: 142 mg/dL — ABNORMAL HIGH (ref 70–99)

## 2018-07-13 LAB — CULTURE, RESPIRATORY

## 2018-07-13 LAB — IRON AND TIBC: IRON: 20 ug/dL — AB (ref 45–182)

## 2018-07-13 LAB — CULTURE, RESPIRATORY W GRAM STAIN: Culture: NORMAL

## 2018-07-13 LAB — MAGNESIUM: Magnesium: 2.2 mg/dL (ref 1.7–2.4)

## 2018-07-13 LAB — TRIGLYCERIDES: Triglycerides: 79 mg/dL (ref <150)

## 2018-07-13 LAB — FERRITIN: Ferritin: 490 ng/mL — ABNORMAL HIGH (ref 24–336)

## 2018-07-13 MED ORDER — SODIUM CHLORIDE 0.9% FLUSH: 3.0000 mL | INTRAVENOUS | Status: DC | PRN

## 2018-07-13 MED ORDER — SODIUM CHLORIDE 0.9 % IV SOLN: 250.0000 mL | INTRAVENOUS | Status: DC | PRN

## 2018-07-13 MED ORDER — PRO-STAT SUGAR FREE PO LIQD
30.0000 mL | Freq: Three times a day (TID) | ORAL | Status: DC
  Administered 2018-07-13: 30 mL
  Filled 2018-07-13: qty 30

## 2018-07-13 MED ORDER — NICARDIPINE HCL IN NACL 20-0.86 MG/200ML-% IV SOLN
3.0000 mg/h | INTRAVENOUS | Status: DC
  Administered 2018-07-14 (×2): 5 mg/h via INTRAVENOUS
  Administered 2018-07-14: 7 mg/h via INTRAVENOUS
  Administered 2018-07-14 – 2018-07-15 (×3): 5 mg/h via INTRAVENOUS
  Administered 2018-07-15 – 2018-07-16 (×2): 3 mg/h via INTRAVENOUS
  Filled 2018-07-13 (×8): qty 200

## 2018-07-13 MED ORDER — AMLODIPINE BESYLATE 10 MG PO TABS
10.0000 mg | ORAL_TABLET | Freq: Every day | ORAL | Status: DC
  Administered 2018-07-13 – 2018-07-19 (×7): 10 mg via ORAL
  Filled 2018-07-13 (×7): qty 1

## 2018-07-13 MED ORDER — HEPARIN SODIUM (PORCINE) 1000 UNIT/ML DIALYSIS: 20.0000 [IU]/kg | INTRAMUSCULAR | Status: DC | PRN

## 2018-07-13 MED ORDER — ANTICOAGULANT SODIUM CITRATE 4% (200MG/5ML) IV SOLN
5.0000 mL | Status: DC | PRN
  Administered 2018-07-13: 5 mL via INTRAVENOUS
  Filled 2018-07-13 (×2): qty 5

## 2018-07-13 MED ORDER — VITAL 1.5 CAL PO LIQD
1000.0000 mL | ORAL | Status: DC
  Administered 2018-07-14: 1000 mL
  Filled 2018-07-13 (×7): qty 1000

## 2018-07-13 MED ORDER — SODIUM CHLORIDE 0.9% FLUSH
3.0000 mL | Freq: Two times a day (BID) | INTRAVENOUS | Status: DC
  Administered 2018-07-13 – 2018-07-19 (×10): 3 mL via INTRAVENOUS

## 2018-07-13 MED ORDER — SODIUM CHLORIDE 0.9 % IV SOLN
125.0000 mg | Freq: Every day | INTRAVENOUS | Status: AC
  Administered 2018-07-13 – 2018-07-17 (×5): 125 mg via INTRAVENOUS
  Filled 2018-07-13 (×5): qty 10

## 2018-07-13 MED ORDER — HEPARIN SODIUM (PORCINE) 1000 UNIT/ML IJ SOLN
INTRAMUSCULAR | Status: AC
  Filled 2018-07-13: qty 2

## 2018-07-13 MED ORDER — NITROGLYCERIN IN D5W 200-5 MCG/ML-% IV SOLN: 0.0000 ug/min | INTRAVENOUS | Status: AC

## 2018-07-13 NOTE — Progress Notes (Signed)
Subjective:  Remains intubated - MRI apparently showed a CVA- BP overall better   Objective Vital signs in last 24 hours: Vitals:   07/13/18 0400 07/13/18 0500 07/13/18 0600 07/13/18 0713  BP:  (!) 117/59 (!) 166/70   Pulse: 84 65 61   Resp: _0 Temp:    99.5 F (37.5 C)  TempSrc:    Axillary  SpO2: 100% 100% 100%   Weight:  79 kg    Height:       Weight change: 0.5 kg  Intake/Output Summary (Last 24 hours) at 07/13/2018 0806 Last data filed at 07/13/2018 0100 Gross per 24 hour  Intake 978.87 ml  Output 117 ml  Net 861.87 ml    Dialysis Orders:  Home HD - 4d/week, 3.5hr, EDW 76kg, AVF with buttonholes  Assessment/Plan: 1.  Seizure: Known seizure d/o - on Vimpat prior to admit, likely worsened by uremia. CT neg for acute finding. Neuro involved- EEG has not shown sz on meds (vimpat)  2.  AMS: MRI did show small CVA. Likely related to #3. Now uremia is better- more alert but not extubated yet 3.  ESRD: Usual home HD - last dialyzed 10/12 at home, BUN/Cr high.  Tried to do IHD on 10/20- had sz- then moved to ICU for CRRT. Running well from 10/20-10/22 planning on IHD at bedside today 4.  Hypertension/volume:  BP was very high, no peripheral edema  Highly variable- norvasc, coreg, clonidine, hydralazine- also iv ntg up and down- have not really removed too much volume so far- will attempt some UF with HD today and see what that does  5.  Anemia: Hgb 11.2, dropped to the 9/s, now 7's. added ESA- iron stores low, will replete - amy need transfusion- platelets dropping as well, will hold heparin and check HIT  6.  Metabolic bone disease: Ca ok, Phos 5.4, now low.  7.  Hep B + 8.  HIV    Jeffrey [name]    Labs: Basic Metabolic Panel: Recent Labs  Lab 07/11/18 1726 07/12/18 0417 07/13/18 0500  NA 136 137 136  K 3.9 4.3 4.9  CL 105 104 102  CO2 _1 GLUCOSE 82 105* 144*  BUN 56* 35* 54*  CREATININE 10.13* 6.70* 7.50*  CALCIUM 8.2* 8.2* 8.6*  PHOS  3.2  3.2 2.3* 2.8   Liver Function Tests: Recent Labs  Lab 07/09/18 2149 07/10/18 0034  07/11/18 1726 07/12/18 0417 07/13/18 0500  AST 15 19  --   --   --   --   ALT <5 7  --   --   --   --   ALKPHOS 65 62  --   --   --   --   BILITOT 1.0 0.9  --   --   --   --   PROT 7.9 7.2  --   --   --   --   ALBUMIN 3.6 3.4*   < > 2.8* 2.6* 2.7*   < > = values in this interval not displayed.   No results for input(s): LIPASE, AMYLASE in the last 168 hours. Recent Labs  Lab 07/10/18 0034 07/10/18 2236  AMMONIA 40* 38*   CBC: Recent Labs  Lab 07/09/18 2149 07/10/18 0034 07/10/18 2049 07/11/18 0341 07/13/18 0500  WBC 11.6* 11.3* 11.7* 6.8 4.5  NEUTROABS  --   --   --   --  3.8  HGB 12.5* 11.2* 11.4* 9.6* 7.8*  HCT  39.1 36.1* 36.4* 30.5* 26.1*  MCV 91.1 90.9 92.4 90.2 93.9  PLT 140* 174 160 115* 79*   Cardiac Enzymes: Recent Labs  Lab 07/10/18 2236 07/11/18 0341 07/11/18 1043  TROPONINI 0.07* 0.09* 0.07*   CBG: Recent Labs  Lab 07/12/18 1607 07/12/18 1915 07/12/18 2325 07/13/18 0311 07/13/18 0711  GLUCAP 102* 128* 146* 150* 163*    Iron Studies: No results for input(s): IRON, TIBC, TRANSFERRIN, FERRITIN in the last 72 hours. Studies/Results: Mr Brain Wo Contrast  Result Date: 07/12/2018 CLINICAL DATA:  62 year old male with HIV and in stage renal disease on dialysis status post seizures. EXAM: MRI HEAD WITHOUT CONTRAST TECHNIQUE: Multiplanar, multiecho pulse sequences of the brain and surrounding structures were obtained without intravenous contrast. COMPARISON:  Head CT 07/10/2018 and earlier.  Brain MRI 04/11/2018. FINDINGS: Brain: There is a linear 12 millimeter focus of restricted diffusion in the anterior left frontal lobe subcortical white matter (series 5, image 63). Associated T2 and FLAIR hyperintensity with no hemorrhage or mass effect. Superimposed advanced chronic cerebral white matter T2 and FLAIR hyperintensity. There is a small area of faint ring like  trace diffusion abnormality in the anterior right corona radiata on series 5, image 68 with associated T2 and FLAIR hyperintensity which is new since July. No other restricted diffusion. No midline shift, mass effect, evidence of mass lesion, ventriculomegaly, extra-axial collection or acute intracranial hemorrhage. Cervicomedullary junction and pituitary are within normal limits. Stable cerebral volume. Scattered chronic microhemorrhages in the brain are stable since July except for that in the left temporal lobe on series 11, image 24. No cortical encephalomalacia identified. Relatively little signal abnormality in the deep gray matter nuclei, although a small lacune in the left thalamus on series 9, image 15 is new since July. Thin slice temporal lobe imaging. The hippocampal formations appear symmetric and within normal limits. Vascular: Major intracranial vascular flow voids are stable. Skull and upper cervical spine: Negative visible cervical spine. Visualized bone marrow signal is within normal limits. Sinuses/Orbits: Stable and negative. Other: Intubated. Small volume fluid in the pharynx. Stable mild right mastoid effusion. IMPRESSION: 1. Small acute white matter infarct in the anterior left frontal lobe. No associated hemorrhage or mass effect. 2. Small late subacute to early chronic white matter lacune in the anterior right corona radiata. Also, small lacunar infarct in the left thalamus and microhemorrhage in the left temporal lobe are chronic but new since July. 3. Underlying advanced chronic cerebral white matter disease and scattered chronic microhemorrhages. Electronically Signed   By: Genevie Ann M.D.   On: 07/12/2018 16:07   Dg Chest Port 1 View  Result Date: 07/12/2018 CLINICAL DATA:  Acute respiratory failure with hypoxemia. EXAM: PORTABLE CHEST 1 VIEW COMPARISON:  Radiograph July 10, 2018. FINDINGS: Stable cardiomediastinal silhouette. Endotracheal nasogastric tubes are unchanged in  position. Right internal jugular catheter is unchanged in position. No pneumothorax is noted. Minimal right basilar subsegmental atelectasis is noted. Mild left basilar atelectasis or infiltrate is noted with associated pleural effusion. Severe degenerative changes seen involving the left glenohumeral joint. IMPRESSION: Stable support apparatus. Mild left basilar atelectasis or infiltrate is noted with associated pleural effusion. Electronically Signed   By: Marijo Conception, M.D.   On: 07/12/2018 07:37   Medications: Infusions: . sodium chloride Stopped (07/12/18 2234)  . dexamethasone (DECADRON) IVPB [facility] 4 mg (07/13/18 0356)  . feeding supplement (VITAL 1.5 CAL) 35 mL/hr at 07/13/18 0100  . nitroGLYCERIN 35 mcg/min (07/13/18 0100)    Scheduled Medications: .  amLODipine  5 mg Oral Daily  . bictegravir-emtricitabine-tenofovir AF  1 tablet Oral Daily  . carvedilol  25 mg Oral BID WC  . chlorhexidine gluconate (MEDLINE KIT)  15 mL Mouth Rinse BID  . Chlorhexidine Gluconate Cloth  6 each Topical Q0600  . cloNIDine  0.3 mg Oral TID  . darbepoetin (ARANESP) injection - DIALYSIS  60 mcg Intravenous Q Wed-HD  . famotidine  20 mg Per Tube Daily  . feeding supplement (PRO-STAT SUGAR FREE 64)  60 mL Per Tube BID  . heparin      . heparin  5,000 Units Subcutaneous Q8H  . hydrALAZINE  100 mg Oral TID  . insulin aspart  1-3 Units Subcutaneous Q4H  . ketorolac  1 drop Left Eye QID  . lacosamide  150 mg Oral BID  . mouth rinse  15 mL Mouth Rinse 10 times per day  . multivitamin  1 tablet Oral Daily  . ofloxacin  1 drop Left Eye QID  . prednisoLONE acetate  1 drop Left Eye QID  . sodium chloride flush  3 mL Intravenous Q12H    have reviewed scheduled and prn medications.  Physical Exam: General: intubated, more alert this AM Heart: RRR Lungs: mostly clear- 30 % fio2 Abdomen: soft, non tender Extremities: no edema Dialysis Access: has left AVF- patent- also right IJ temp cath placed  10/20   07/13/2018,8:06 AM  LOS: 3 days

## 2018-07-13 NOTE — Progress Notes (Signed)
Nutrition Follow-up  DOCUMENTATION CODES:   Non-severe (moderate) malnutrition in context of chronic illness  INTERVENTION:   Tube Feeding:  Vital 1.5 @ 45 ml/hr Pro-Stat 30 mL TID Provides 1920 kcals, 118 g of protein and 821 mL of free water Meets 101% calorie needs, 100% protein needs   NUTRITION DIAGNOSIS:   Moderate Malnutrition related to chronic illness(HIV, ESRD on HD) as evidenced by moderate fat depletion, moderate muscle depletion.  Being addressed via TF   GOAL:   Patient will meet greater than or equal to 90% of their needs  Met  MONITOR:   Vent status, Skin, Labs, Weight trends, I & O's  REASON FOR ASSESSMENT:   Ventilator, Consult Enteral/tube feeding initiation and management  ASSESSMENT:   63 year old male who presented to the ED on 10/19 with seizures and having missed dialysis since 10/12. PMH significant for HIV, hepatitis B, ESRD on home dialysis 4 times per week, aortic valve stenosis, anemia, hypertension, hyperthyroidism, and seizure disorder. Pt was recently discharge from the hospital after being admitted for AV fistula revision.  Failed SBT, Patient remains intubated on ventilator support MV: 9.5 L/min Temp (24hrs), Avg:98.7 F (37.1 C), Min:97.5 F (36.4 C), Max:99.8 F (37.7 C)  Propofol: off  10/20 Did not tolerate iHD, started on CVVHD 10/22 CVVHD stopped  Trial of HD today EDW 76 kg; current wt 80.5 kg. Weight up after stopping CRRT  Tolerating Vital 1.5@ 35 ml/hr with Pro-Stat 60 mL BID  No BM since admission  Labs: phosphorus 2.8 (low for HD pt) Meds: Rena-Vit, ferric gluconate  Diet Order:   Diet Order            Diet NPO time specified  Diet effective now              EDUCATION NEEDS:   Not appropriate for education at this time  Skin:  Skin Assessment: Skin Integrity Issues: Skin Integrity Issues:: Incisions Incisions: left arm  Last BM:  unknown/PTA  Height:   Ht Readings from Last 1 Encounters:   07/09/18 6' (1.829 m)    Weight:   Wt Readings from Last 1 Encounters:  07/13/18 80.5 kg    Ideal Body Weight:  80.91 kg  BMI:  Body mass index is 24.07 kg/m.  Estimated Nutritional Needs:   Kcal:  1907 kcals   Protein:  115-152 g  Fluid:  1000 mL plus UOP   [employer] MS, RD, LDN, CNSC [phone] Pager  [phone] Weekend/On-Call Pager

## 2018-07-13 NOTE — Progress Notes (Signed)
[facility] PCCM  More awake, following commands Still hypertensive  Change to cardene Stop nitro gtt PSV after finishing HD   Roselie Awkward, MD [place] P[facility]M Pager: [phone] Cell: [phone] After 3pm or if no response, call [phone]

## 2018-07-13 NOTE — Progress Notes (Signed)
NAME:  Jeffrey [name], MRN:  [id number], DOB:  [birth year], LOS: 3 ADMISSION DATE:  07/09/2018, CONSULTATION DATE:  07/09/18 REFERRING MD:  Bonner Puna, CHIEF COMPLAINT:  Seizure  Brief History   62 y/o male with multiple medical problems admitted for encephalopathy presumably related to uremia, had multiple seizures after admission.  Has ESRD and HIV.  Past Medical History  HIV, OSA, ESRD Significant Hospital Events   10/19 Admission/Seizure >   Consults: date of consult/date signed off & final recs:  Neurology Renal  Procedures (surgical and bedside):  PIV's ETT 10/20>>  Significant Diagnostic Tests:  Head CT (07/09/18): no acute process Head CT (07/10/18): pending CXR (07/10/18): no acute process Continuous EEG 10/20: no evidence of subclinical seizure MRI 10/22 > New small acute white matter infract ant left frontal lobe, small late suba cute lacunae ant R corn rad advanced chronic cerebral white matter  Micro Data:  Blood cx (07/10/18): pending Sputum cx (07/10/18): pending  Antimicrobials:  None  Subjective:  Off CVVHD MRI with findings worrisome for stroke Has been more awake Failed SBT Night nurse could get him to bring tongue completely back into his mouth   Objective   Blood pressure 126/63, pulse 82, temperature 99.5 F (37.5 C), temperature source Axillary, resp. rate 16, height 6' (1.829 m), weight 79 kg, SpO2 98 %.    Vent Mode: PRVC FiO2 (%):  [30 %] 30 % Set Rate:  [16 bmp] 16 bmp Vt Set:  [620 mL] 620 mL PEEP:  [5 cmH20] 5 cmH20 Plateau Pressure:  [16 cmH20-28 cmH20] 22 cmH20   Intake/Output Summary (Last 24 hours) at 07/13/2018 0859 Last data filed at 07/13/2018 0800 Gross per 24 hour  Intake 1119.69 ml  Output 117 ml  Net 1002.69 ml   Filed Weights   07/11/18 0455 07/12/18 0500 07/13/18 0500  Weight: 78 kg 78.5 kg 79 kg    Examination:  General:  In bed on vent HENT: NCAT ETT in place, tongue about the same PULM: CTA B, vent supported  breathing CV: RRR, no mgr GI: BS+, soft, nontender MSK: normal bulk and tone Neuro: sedated on vent     Resolved Hospital Problem list     Assessment & Plan:  Status epilepticus: no further seizures > continue Vimpat  New small acute frontal lobe stroke > neuro following  Acute encephalopathy : improving, presumably this is still uremia > dialysis again today  New thrombocytopenia and anemia > hold heparin > HIT panel > monitor for bleeding > transfuse for Hgb < 7gm/dL  Acute respiratory failure with hypoxemia: normal CXR/oxygenation > continue full vent support > SBT/WUA after dialysis > VAP prevention  HTN emergency > stop nitro gtt > increase amlodipine > continue coreg > continue hydralazine > continue clonidine > goal < 180  ESRD  > HD today   Tongue swelling: resolved > stop decadron  Disposition / Summary of Today's Plan 07/13/18   Read above     Diet: continue tube feeding Pain/Anxiety/Delirium protocol (if indicated): PAD protocol propofol and fentanyl VAP protocol (if indicated): yes DVT prophylaxis: hep GI prophylaxis: famotidine Hyperglycemia protocol: monitor glucose Mobility: bedrest Code Status: full Family Communication: brother [name] updated bedside  Labs   CBC: Recent Labs  Lab 07/09/18 2149 07/10/18 0034 07/10/18 2049 07/11/18 0341 07/13/18 0500  WBC 11.6* 11.3* 11.7* 6.8 4.5  NEUTROABS  --   --   --   --  3.8  HGB 12.5* 11.2* 11.4* 9.6* 7.8*  HCT 39.1 36.1*  36.4* 30.5* 26.1*  MCV 91.1 90.9 92.4 90.2 93.9  PLT 140* 174 160 115* 79*    Basic Metabolic Panel: Recent Labs  Lab 07/10/18 2049 07/11/18 0341 07/11/18 1043 07/11/18 1726 07/12/18 0417 07/13/18 0500  NA 135 140  --  136 137 136  K 4.3 4.3  --  3.9 4.3 4.9  CL 97* 102  --  105 104 102  CO2 11* 16*  --  22 24 25   GLUCOSE 126* 81  --  82 105* 144*  BUN 102* 100*  --  56* 35* 54*  CREATININE 17.53* 17.25*  --  10.13* 6.70* 7.50*  CALCIUM 8.8* 8.5*   --  8.2* 8.2* 8.6*  MG  --  1.9 1.9 2.0 2.2 2.2  PHOS  --  5.4*  5.4* 3.6 3.2  3.2 2.3* 2.8   GFR: Estimated Creatinine Clearance: 10.9 mL/min (A) (by C-G formula based on SCr of 7.5 mg/dL (H)). Recent Labs  Lab 07/10/18 0034 07/10/18 2049 07/10/18 2236 07/11/18 0341 07/11/18 1043 07/12/18 0417 07/13/18 0500  PROCALCITON  --   --  0.89 0.87  --  0.62  --   WBC 11.3* 11.7*  --  6.8  --   --  4.5  LATICACIDVEN 1.6  --  4.7* 1.9 1.3  --   --     Liver Function Tests: Recent Labs  Lab 07/09/18 2149 07/10/18 0034 07/11/18 0341 07/11/18 1726 07/12/18 0417 07/13/18 0500  AST 15 19  --   --   --   --   ALT <5 7  --   --   --   --   ALKPHOS 65 62  --   --   --   --   BILITOT 1.0 0.9  --   --   --   --   PROT 7.9 7.2  --   --   --   --   ALBUMIN 3.6 3.4* 3.0* 2.8* 2.6* 2.7*   No results for input(s): LIPASE, AMYLASE in the last 168 hours. Recent Labs  Lab 07/10/18 0034 07/10/18 2236  AMMONIA 40* 38*    ABG    Component Value Date/Time   PHART 7.317 (L) 07/11/2018 0440   PCO2ART 38.0 07/11/2018 0440   PO2ART 154 (H) 07/11/2018 0440   HCO3 19.3 (L) 07/11/2018 0440   TCO2 29 05/01/2018 2132   ACIDBASEDEF 6.0 (H) 07/11/2018 0440   O2SAT 98.7 07/11/2018 0440     Coagulation Profile: No results for input(s): INR, PROTIME in the last 168 hours.  Cardiac Enzymes: Recent Labs  Lab 07/10/18 2236 07/11/18 0341 07/11/18 1043  TROPONINI 0.07* 0.09* 0.07*    HbA1C: Hgb A1c MFr Bld  Date/Time Value Ref Range Status  06/03/2011 05:45 AM 5.3 <5.7 % Final    Comment:    (NOTE)                                                                       According to the ADA Clinical Practice Recommendations for 2011, when HbA1c is used as a screening test:  >=6.5%   Diagnostic of Diabetes Mellitus           (if abnormal result is confirmed) 5.7-6.4%   Increased risk of  developing Diabetes Mellitus References:Diagnosis and Classification of Diabetes  [place],Diabetes SAYT,0160,10(XNATF 1):S62-S69 and Standards of Medical Care in         Diabetes - 2011,Diabetes TDDU,2025,42 (Suppl 1):S11-S61.    CBG: Recent Labs  Lab 07/12/18 1607 07/12/18 1915 07/12/18 2325 07/13/18 0311 07/13/18 0711  GLUCAP 102* 128* 146* 150* 163*      Critical care time: 32 minutes     Roselie Awkward, MD  PCCM Pager: [phone] Cell: [phone] After 3pm or if no response, call [phone]

## 2018-07-13 NOTE — Care Management Note (Signed)
Case Management Note Marvetta Gibbons RN,BSN Transitions of Care Unit 75M - RN Case Manager [phone]  Patient Details  Name: Jeffrey [name] MRN: [id number] Date of Birth: May 06, 1956  Subjective/Objective:    Pt admitted with seizures, intubated remains on vent 10/23 hx of HD at home                Action/Plan: PTA pt lived at home with brother, active with [facility].  CM will follow for transition of care needs  Expected Discharge Date:                  Expected Discharge Plan:  [place]  In-House Referral:     Discharge planning Services  CM Consult  Post Acute Care Choice:    Choice offered to:     DME Arranged:    DME Agency:     HH Arranged:    HH Agency:     Status of Service:  In process, will continue to follow  If discussed at Long Length of Stay Meetings, dates discussed:    Discharge Disposition:   Additional Comments:  Dawayne Patricia, RN 07/13/2018, 4:22 PM

## 2018-07-13 NOTE — Progress Notes (Signed)
   07/13/18 0900  Clinical Encounter Type  Visited With Family  Visit Type Initial  Referral From Nurse  Spiritual Encounters  Spiritual Needs Prayer  Stress Factors  Patient Stress Factors None identified  Family Stress Factors Health changes   Received PT consult from evening Chaplain on call for an AD(to be delivered before 9am)(8:45am). PT was unresponsive and his brother was at bedside. Brother stated that he was power of attorney of PT. Brother asked for information about the AD. I gave him a brief description on the AD and left him a copy to overlook. He also stated that their pastor will be visiting soon. After doctor informed Brother of the status of the PT, I offered spiritual care with words of encouragement, a listening ear and prayer. Chaplain available as needed.   Chaplain Fidel Levy  [phone]

## 2018-07-13 NOTE — Progress Notes (Signed)
STROKE TEAM PROGRESS NOTE   SUBJECTIVE (INTERVAL HISTORY) His RN and brother are at the bedside.  Pt still intubated and just was given versed for agitation and vent dyssynchrony. Pt did not pass weaning this am due to apnea and tachycardia. Will have HD today and re-try weaning. No seizure activity and following commands and moving all extremities with RN before giving versed.    OBJECTIVE Temp:  [98.4 F (36.9 C)-99.8 F (37.7 C)] 99.5 F (37.5 C) (10/23 0713) Pulse Rate:  [58-84] 82 (10/23 0800) Cardiac Rhythm: Normal sinus rhythm;Heart block (10/23 0800) Resp:  [15-19] 16 (10/23 0800) BP: (106-214)/(55-81) 126/63 (10/23 0800) SpO2:  [96 %-100 %] 98 % (10/23 0800) FiO2 (%):  [30 %] 30 % (10/23 0800) Weight:  [79 kg] 79 kg (10/23 0500)  Recent Labs  Lab 07/12/18 1607 07/12/18 1915 07/12/18 2325 07/13/18 0311 07/13/18 0711  GLUCAP 102* 128* 146* 150* 163*   Recent Labs  Lab 07/10/18 2049 07/11/18 0341 07/11/18 1043 07/11/18 1726 07/12/18 0417 07/13/18 0500  NA 135 140  --  136 137 136  K 4.3 4.3  --  3.9 4.3 4.9  CL 97* 102  --  105 104 102  CO2 11* 16*  --  22 24 25   GLUCOSE 126* 81  --  82 105* 144*  BUN 102* 100*  --  56* 35* 54*  CREATININE 17.53* 17.25*  --  10.13* 6.70* 7.50*  CALCIUM 8.8* 8.5*  --  8.2* 8.2* 8.6*  MG  --  1.9 1.9 2.0 2.2 2.2  PHOS  --  5.4*  5.4* 3.6 3.2  3.2 2.3* 2.8   Recent Labs  Lab 07/09/18 2149 07/10/18 0034 07/11/18 0341 07/11/18 1726 07/12/18 0417 07/13/18 0500  AST 15 19  --   --   --   --   ALT <5 7  --   --   --   --   ALKPHOS 65 62  --   --   --   --   BILITOT 1.0 0.9  --   --   --   --   PROT 7.9 7.2  --   --   --   --   ALBUMIN 3.6 3.4* 3.0* 2.8* 2.6* 2.7*   Recent Labs  Lab 07/09/18 2149 07/10/18 0034 07/10/18 2049 07/11/18 0341 07/13/18 0500  WBC 11.6* 11.3* 11.7* 6.8 4.5  NEUTROABS  --   --   --   --  3.8  HGB 12.5* 11.2* 11.4* 9.6* 7.8*  HCT 39.1 36.1* 36.4* 30.5* 26.1*  MCV 91.1 90.9 92.4 90.2 93.9   PLT 140* 174 160 115* 79*   Recent Labs  Lab 07/10/18 2236 07/11/18 0341 07/11/18 1043  TROPONINI 0.07* 0.09* 0.07*   No results for input(s): LABPROT, INR in the last 72 hours. No results for input(s): COLORURINE, LABSPEC, [place], GLUCOSEU, HGBUR, BILIRUBINUR, KETONESUR, PROTEINUR, UROBILINOGEN, NITRITE, LEUKOCYTESUR in the last 72 hours.  Invalid input(s): APPERANCEUR     Component Value Date/Time   CHOL 120 11/01/2017 1008   TRIG 145 07/10/2018 2236   HDL 29 (L) 11/01/2017 1008   CHOLHDL 4.1 11/01/2017 1008   VLDL 5 02/10/2017 1619   LDLCALC 72 11/01/2017 1008   Lab Results  Component Value Date   HGBA1C 5.3 06/03/2011      Component Value Date/Time   LABOPIA NONE DETECTED 06/02/2011 0948   COCAINSCRNUR POSITIVE (A) 06/02/2011 0948   LABBENZ NONE DETECTED 06/02/2011 0948   AMPHETMU NONE DETECTED 06/02/2011 3810  THCU NONE DETECTED 06/02/2011 0948   LABBARB NONE DETECTED 06/02/2011 0948    No results for input(s): ETH in the last 168 hours.  I have personally reviewed the radiological images below and agree with the radiology interpretations.  Ct Head Wo Contrast  Result Date: 07/10/2018 CLINICAL DATA:  Seizure.  HIV.  Inpatient. EXAM: CT HEAD WITHOUT CONTRAST TECHNIQUE: Contiguous axial images were obtained from the base of the skull through the vertex without intravenous contrast. COMPARISON:  07/09/2018 head CT. FINDINGS: Brain: No evidence of parenchymal hemorrhage or extra-axial fluid collection. No mass lesion, mass effect, or midline shift. No CT evidence of acute infarction. Nonspecific moderate subcortical and periventricular white matter hypodensity, most in keeping with chronic small vessel ischemic change. Generalized cerebral volume loss. Cerebral ventricle sizes are stable and concordant with the degree of cerebral volume loss. Vascular: No acute abnormality. Skull: No evidence of calvarial fracture. Sinuses/Orbits: The visualized paranasal sinuses are  essentially clear. Other: Partial right mastoid effusion. Clear left mastoid air cells. IMPRESSION: 1. No evidence of acute intracranial abnormality. No evidence of calvarial fracture. 2. Generalized cerebral volume loss and moderate chronic small vessel ischemic changes in the cerebral white matter. 3. Partial right mastoid effusion. Electronically Signed   By: Ilona Sorrel M.D.   On: 07/10/2018 23:12   Ct Head Wo Contrast  Result Date: 07/09/2018 CLINICAL DATA:  Altered level of consciousness (LOC), unexplained. Seizure. EXAM: CT HEAD WITHOUT CONTRAST TECHNIQUE: Contiguous axial images were obtained from the base of the skull through the vertex without intravenous contrast. COMPARISON:  Head CT 06/04/2018 FINDINGS: Brain: Unchanged degree of atrophy and moderate to advanced chronic small vessel ischemia from prior exam. No intracranial hemorrhage, mass effect, or midline shift. No hydrocephalus. The basilar cisterns are patent. No evidence of territorial infarct or acute ischemia. No extra-axial or intracranial fluid collection. Vascular: No hyperdense vessel. Skull: No fracture or focal lesion. Sinuses/Orbits: Unchanged mild opacification of right mastoid air cells. No acute findings. Prior left cataract resection. Other: None. IMPRESSION: 1.  No acute intracranial abnormality. 2. Unchanged atrophy and chronic small vessel ischemia. Electronically Signed   By: Keith Rake M.D.   On: 07/09/2018 23:40   Mr Brain Wo Contrast  Result Date: 07/12/2018 CLINICAL DATA:  62 year old male with HIV and in stage renal disease on dialysis status post seizures. EXAM: MRI HEAD WITHOUT CONTRAST TECHNIQUE: Multiplanar, multiecho pulse sequences of the brain and surrounding structures were obtained without intravenous contrast. COMPARISON:  Head CT 07/10/2018 and earlier.  Brain MRI 04/11/2018. FINDINGS: Brain: There is a linear 12 millimeter focus of restricted diffusion in the anterior left frontal lobe  subcortical white matter (series 5, image 63). Associated T2 and FLAIR hyperintensity with no hemorrhage or mass effect. Superimposed advanced chronic cerebral white matter T2 and FLAIR hyperintensity. There is a small area of faint ring like trace diffusion abnormality in the anterior right corona radiata on series 5, image 68 with associated T2 and FLAIR hyperintensity which is new since July. No other restricted diffusion. No midline shift, mass effect, evidence of mass lesion, ventriculomegaly, extra-axial collection or acute intracranial hemorrhage. Cervicomedullary junction and pituitary are within normal limits. Stable cerebral volume. Scattered chronic microhemorrhages in the brain are stable since July except for that in the left temporal lobe on series 11, image 24. No cortical encephalomalacia identified. Relatively little signal abnormality in the deep gray matter nuclei, although a small lacune in the left thalamus on series 9, image 15 is new since July. Thin  slice temporal lobe imaging. The hippocampal formations appear symmetric and within normal limits. Vascular: Major intracranial vascular flow voids are stable. Skull and upper cervical spine: Negative visible cervical spine. Visualized bone marrow signal is within normal limits. Sinuses/Orbits: Stable and negative. Other: Intubated. Small volume fluid in the pharynx. Stable mild right mastoid effusion. IMPRESSION: 1. Small acute white matter infarct in the anterior left frontal lobe. No associated hemorrhage or mass effect. 2. Small late subacute to early chronic white matter lacune in the anterior right corona radiata. Also, small lacunar infarct in the left thalamus and microhemorrhage in the left temporal lobe are chronic but new since July. 3. Underlying advanced chronic cerebral white matter disease and scattered chronic microhemorrhages. Electronically Signed   By: Genevie Ann M.D.   On: 07/12/2018 16:07   Carotid Doppler  pending  TTE  pending  MRA pending   PHYSICAL EXAM  Temp:  [98.4 F (36.9 C)-99.8 F (37.7 C)] 99.5 F (37.5 C) (10/23 0713) Pulse Rate:  [58-84] 82 (10/23 0800) Resp:  [15-19] 16 (10/23 0800) BP: (106-214)/(55-81) 126/63 (10/23 0800) SpO2:  [96 %-100 %] 98 % (10/23 0800) FiO2 (%):  [30 %] 30 % (10/23 0800) Weight:  [79 kg] 79 kg (10/23 0500)  General - Well nourished, well developed, drowsy and intubated.  Ophthalmologic - fundi not visualized due to noncooperation.  Cardiovascular - Regular rate and rhythm.  Neuro -intubated, just received Versed for vent dyssynchrony and agitation.  Patient drowsy sleepy, briefly open eyes on voice and pain.  Able to follow some simple commands, but not all of them.  With eyes held open, PERRL, EOMI, possible corneal reflexes bilaterally, however not blinking to visual threat bilaterally.  Positive gag and cough reflexes.  Able to show sounds bilaterally however, not holding up against gravity as requested.  Able to wiggle toes bilaterally, however not moving lower extremities on pain summation.  DTR diminished, no Babinski. Sensation, coordination and gait not tested.    ASSESSMENT/PLAN Jeffrey [name] is a 63 y.o. male with history of aortic valve severe regurgitation due to endocarditis, ESRD on HD, chronic anemia, HIV, hep B, hypertension, seizure, OSA, CAD, thrombocytopenia admitted for uremia, seizure and encephalopathy.  Seizure controlled after increase Vimpat dose, long-term EEG negative x2.  MRI showed left frontal white matter small infarct.  No tPA given due to outside window.    Stroke, likely incidental:  left frontal juxtacortical small infarct, etiology unclear, could be due to small vessel disease vs. Cardioembolic source given severe AVR, LAE, non-compaction and hx of endocarditis  Asymptomatic from stroke standpoint  CT no acute abnormality  MRI left frontal white matter small infarct  MRA pending  Carotid Doppler  pending  2D  Echo  pending  LDL pending  HgbA1c pending   SCDs for VTE prophylaxis given thrombocytopenia  NPO   No antithrombotic prior to admission, now on No antithrombotic given thrombocytopenia and anemia  Ongoing aggressive stroke risk factor management  Therapy recommendations:  pending  Disposition:  pending  Seizure  Hx of seizure following with Dr. 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  TTE pending  Thrombocytopenia  Platelet [id number]  Concerning for HIT syndrome  Off heparin or Lovenox  CCM on board  Anemia  Likely due to ESRD and iron deficiency  On iron infusion  Hemoglobin 12.5-11.2-9.6-7.8  CBC monitoring  Uremia and ESRD on hemodialysis  Admitted for uremia with missed several HDs  Nephrology on board  On dialysis - will have today  Hypertension emergency . High on admission with seizure . Currently BP still high . On p.o. amlodipine, Coreg, clonidine, hydralazine and nitroglycerin IV infusion . Wean off nitroglycerin as able  Long term BP goal normotensive  Encephalopathy  Improving  Likely due to multifactorial with uremia, ESRD, hypertensive emergency, seizure  Left frontal small infarct to small to explain mental status change.  Continue monitoring  Respiratory failure  Intubated for airway  protection  Failed weaning this morning due to apnea and tachycardia  Still on vent  CCM on board  Wean off ventilation as able  HIV  On HARRT therapy  CD4 168/2019, down from 390 in 10/2017  Recheck CD4 pending  Afebrile this time  Other Stroke Risk Factors  Advanced age  Hx stroke/TIA -by imaging, left thalamic small lacunar  CAD  OSA  Other Active Problems  Hepatitis B  Hospital day # 3  This patient is critically ill due to stroke, seizure, respiratory failure, thrombocytopenia, anemia, severe AVR, LAE and non-compaction and at significant risk of neurological worsening, death form recurrent stroke, hemorrhagic conversion, status epilepticus, hemorrhage, heart failure, cardiac arrest. This patient's care requires constant monitoring of vital signs, hemodynamics, respiratory and cardiac monitoring, review of multiple databases, neurological assessment, discussion with family, other specialists and medical decision making of high complexity. I spent 40 minutes of neurocritical care time in the care of this patient.   Rosalin Hawking, MD PhD Stroke Neurology 07/13/2018 10:41 AM    To contact Stroke Continuity provider, please refer to [email or web address] After hours, contact General Neurology

## 2018-07-14 ENCOUNTER — Inpatient Hospital Stay (HOSPITAL_COMMUNITY): Payer: Medicare Other

## 2018-07-14 DIAGNOSIS — I5030 Unspecified diastolic (congestive) heart failure: Secondary | ICD-10-CM

## 2018-07-14 DIAGNOSIS — I63412 Cerebral infarction due to embolism of left middle cerebral artery: Secondary | ICD-10-CM

## 2018-07-14 DIAGNOSIS — Z01810 Encounter for preprocedural cardiovascular examination: Secondary | ICD-10-CM

## 2018-07-14 DIAGNOSIS — D638 Anemia in other chronic diseases classified elsewhere: Secondary | ICD-10-CM

## 2018-07-14 LAB — RENAL FUNCTION PANEL
ALBUMIN: 2.7 g/dL — AB (ref 3.5–5.0)
ANION GAP: 10 (ref 5–15)
Albumin: 2.9 g/dL — ABNORMAL LOW (ref 3.5–5.0)
Anion gap: 9 (ref 5–15)
BUN: 27 mg/dL — AB (ref 8–23)
BUN: 32 mg/dL — ABNORMAL HIGH (ref 8–23)
CALCIUM: 8.7 mg/dL — AB (ref 8.9–10.3)
CALCIUM: 8.6 mg/dL — AB (ref 8.9–10.3)
CO2: 28 mmol/L (ref 22–32)
CO2: 27 mmol/L (ref 22–32)
CREATININE: 4.5 mg/dL — AB (ref 0.61–1.24)
Chloride: 100 mmol/L (ref 98–111)
Chloride: 101 mmol/L (ref 98–111)
Creatinine, Ser: 5.66 mg/dL — ABNORMAL HIGH (ref 0.61–1.24)
GFR calc Af Amer: 15 mL/min — ABNORMAL LOW (ref >60)
GFR calc non Af Amer: 13 mL/min — ABNORMAL LOW (ref >60)
GFR, EST AFRICAN AMERICAN: 11 mL/min — AB (ref >60)
GFR, EST NON AFRICAN AMERICAN: 10 mL/min — AB (ref >60)
GLUCOSE: 135 mg/dL — AB (ref 70–99)
Glucose, Bld: 121 mg/dL — ABNORMAL HIGH (ref 70–99)
PHOSPHORUS: 2.1 mg/dL — AB (ref 2.5–4.6)
PHOSPHORUS: 2.5 mg/dL (ref 2.5–4.6)
Potassium: 3.4 mmol/L — ABNORMAL LOW (ref 3.5–5.1)
Potassium: 3.8 mmol/L (ref 3.5–5.1)
SODIUM: 137 mmol/L (ref 135–145)
SODIUM: 138 mmol/L (ref 135–145)

## 2018-07-14 LAB — ECHOCARDIOGRAM COMPLETE
Height: 72 in
WEIGHTICAEL: 2740.76 [oz_av]

## 2018-07-14 LAB — CBC
HCT: 28.5 % — ABNORMAL LOW (ref 39.0–52.0)
HEMOGLOBIN: 8.5 g/dL — AB (ref 13.0–17.0)
MCH: 28.4 pg (ref 26.0–34.0)
MCHC: 29.8 g/dL — AB (ref 30.0–36.0)
MCV: 95.3 fL (ref 80.0–100.0)
PLATELETS: 94 10*3/uL — AB (ref 150–400)
RBC: 2.99 MIL/uL — AB (ref 4.22–5.81)
RDW: 16.6 % — ABNORMAL HIGH (ref 11.5–15.5)
WBC: 5.4 10*3/uL (ref 4.0–10.5)
nRBC: 0 % (ref 0.0–0.2)

## 2018-07-14 LAB — GLUCOSE, CAPILLARY
GLUCOSE-CAPILLARY: 120 mg/dL — AB (ref 70–99)
Glucose-Capillary: 106 mg/dL — ABNORMAL HIGH (ref 70–99)
Glucose-Capillary: 112 mg/dL — ABNORMAL HIGH (ref 70–99)
Glucose-Capillary: 114 mg/dL — ABNORMAL HIGH (ref 70–99)
Glucose-Capillary: 121 mg/dL — ABNORMAL HIGH (ref 70–99)
Glucose-Capillary: 143 mg/dL — ABNORMAL HIGH (ref 70–99)

## 2018-07-14 LAB — HEPATITIS B SURFACE ANTIGEN

## 2018-07-14 LAB — MAGNESIUM: Magnesium: 2 mg/dL (ref 1.7–2.4)

## 2018-07-14 LAB — HEMOGLOBIN A1C
Hgb A1c MFr Bld: 4.1 % — ABNORMAL LOW (ref 4.8–5.6)
Mean Plasma Glucose: 70.97 mg/dL

## 2018-07-14 LAB — HEPARIN INDUCED PLATELET AB (HIT ANTIBODY): Heparin Induced Plt Ab: 0.154 OD (ref 0.000–0.400)

## 2018-07-14 LAB — LIPID PANEL
CHOLESTEROL: 98 mg/dL (ref 0–200)
HDL: 35 mg/dL — ABNORMAL LOW (ref >40)
LDL Cholesterol: 49 mg/dL (ref 0–99)
TRIGLYCERIDES: 70 mg/dL (ref <150)
Total CHOL/HDL Ratio: 2.8 RATIO
VLDL: 14 mg/dL (ref 0–40)

## 2018-07-14 LAB — HEPATITIS B SURFACE AG, CONFIRM: HBsAg Confirmation: POSITIVE — AB

## 2018-07-14 MED ORDER — POTASSIUM PHOSPHATES 15 MMOLE/5ML IV SOLN
10.0000 mmol | Freq: Once | INTRAVENOUS | Status: AC
  Administered 2018-07-14: 10 mmol via INTRAVENOUS
  Filled 2018-07-14: qty 3.33

## 2018-07-14 MED ORDER — ASPIRIN EC 81 MG PO TBEC
81.0000 mg | DELAYED_RELEASE_TABLET | Freq: Every day | ORAL | Status: DC
  Administered 2018-07-14 – 2018-07-19 (×6): 81 mg via ORAL
  Filled 2018-07-14 (×6): qty 1

## 2018-07-14 MED ORDER — ISOSORBIDE MONONITRATE 20 MG PO TABS
10.0000 mg | ORAL_TABLET | Freq: Two times a day (BID) | ORAL | Status: DC
  Administered 2018-07-14: 10 mg via ORAL
  Filled 2018-07-14 (×3): qty 1

## 2018-07-14 MED ORDER — CHLORHEXIDINE GLUCONATE CLOTH 2 % EX PADS
6.0000 | MEDICATED_PAD | Freq: Every day | CUTANEOUS | Status: DC
  Administered 2018-07-14: 6 via TOPICAL

## 2018-07-14 MED FILL — Medication: Qty: 1 | Status: AC

## 2018-07-14 NOTE — Progress Notes (Signed)
  Echocardiogram 2D Echocardiogram has been performed.  Darlina Sicilian M 07/14/2018, 8:52 AM

## 2018-07-14 NOTE — Procedures (Signed)
Extubation Procedure Note  Patient Details:   Name: Jeffrey [name] DOB: March 27, 1956 MRN: [id number]   Airway Documentation:    Vent end date: 07/14/18 Vent end time: 1058   Evaluation  O2 sats: stable throughout Complications: No apparent complications Patient did tolerate procedure well. Bilateral Breath Sounds: Rhonchi, Diminished   Yes   Pt extubated to 3L [name] per MD order. Positive cuff leak noted prior to extubation. Pt with weak non productive cough and only able to whisper name post extubation. Pt encouraged to use Yankauer to clear secretions. No stridor noted. RT will continue to monitor.   Jesse Sans 07/14/2018, 11:17 AM

## 2018-07-14 NOTE — Progress Notes (Signed)
Pre-op Cardiac Surgery  Carotid Findings:  ICA 1-39%   Upper Extremity Right Left  Brachial Pressures triphasic triphasic  Radial Waveforms triphasic Graft in arm  Ulnar Waveforms triphasic Graft in arm   Palmar Arch (Allen's Test) Radial: wnl Ulnar: obliterate Radial: obliterate w/ comp Ulnar: obliterate w/ comp      Jeffrey [name] 07/14/2018 3:15 PM

## 2018-07-14 NOTE — Progress Notes (Signed)
NAME:  Jeffrey [name], MRN:  [id number], DOB:  03/03/1956, LOS: 4 ADMISSION DATE:  07/09/2018, CONSULTATION DATE:  07/09/18 REFERRING MD:  Bonner Puna, CHIEF COMPLAINT:  Seizure  Brief History   62 y/o male with multiple medical problems admitted for encephalopathy presumably related to uremia, had multiple seizures after admission.  Has ESRD and HIV.  Past Medical History  HIV, OSA, ESRD Significant Hospital Events   10/19 Admission/Seizure >   Consults: date of consult/date signed off & final recs:  Neurology Renal  Procedures (surgical and bedside):  PIV's ETT 10/20>>  Significant Diagnostic Tests:  Head CT (07/09/18): no acute process Head CT (07/10/18): pending CXR (07/10/18): no acute process Continuous EEG 10/20: no evidence of subclinical seizure MRI 10/22 > New small acute white matter infract ant left frontal lobe, small late suba cute lacunae ant R corn rad advanced chronic cerebral white matter  Micro Data:  Blood cx (07/10/18): pending Sputum cx (07/10/18): pending  Antimicrobials:  None  Subjective:   Awake, alert, following commands Had HD yesterday   Objective   Blood pressure (!) 218/75, pulse 70, temperature 99.4 F (37.4 C), temperature source Oral, resp. rate (!) 24, height 6' (1.829 m), weight 77.7 kg, SpO2 100 %.    Vent Mode: PRVC FiO2 (%):  [30 %] 30 % Set Rate:  [16 bmp] 16 bmp Vt Set:  [237 mL] 620 mL PEEP:  [5 cmH20] 5 cmH20 Plateau Pressure:  [17 cmH20-23 cmH20] 21 cmH20   Intake/Output Summary (Last 24 hours) at 07/14/2018 1037 Last data filed at 07/14/2018 0600 Gross per 24 hour  Intake 1268.07 ml  Output 3000 ml  Net -1731.93 ml   Filed Weights   07/13/18 1345 07/13/18 1750 07/14/18 0228  Weight: 80.5 kg 77.4 kg 77.7 kg    Examination:  General:  In bed on vent HENT: NCAT ETT in place PULM: CTA B, vent supported breathing CV: RRR, systolic murmur GI: BS+, soft, nontender MSK: normal bulk and tone Neuro: sedated on  vent     Resolved Hospital Problem list     Assessment & Plan:  Status epilepticus: no further seizures > continue Vimpat  New small acute frontal lobe stroke > f/u neuro/stroke recs > MRA pending > F/U TTE  Acute encephalopathy : improved due to stroke, uremia  Severe AVR > f/u TTE > TCTS following  Severe  New thrombocytopenia > improving > holding heparin > monitor for bleeding > f/u HIT  Acute respiratory failure with hypoxemia: resolved > extubate today > SLP evaluation  HTN emergency > cardene drip > wean off cardene for SBP < 180 > add isosorbide > continue amlodipine, hydralazine, coreg, clonidine  ESRD  > HD per renal  Tongue swelling: resolved   Disposition / Summary of Today's Plan 07/14/18   Read above     Diet: advance diet after extubation Pain/Anxiety/Delirium protocol (if indicated): d/c PAD protocol toda VAP protocol (if indicated): yes DVT prophylaxis: hep GI prophylaxis: famotidine Hyperglycemia protocol: monitor glucose Mobility: bedrest Code Status: full Family Communication: brother [name] updated bedside  Labs   CBC: Recent Labs  Lab 07/10/18 0034 07/10/18 2049 07/11/18 0341 07/13/18 0500 07/14/18 0510  WBC 11.3* 11.7* 6.8 4.5 5.4  NEUTROABS  --   --   --  3.8  --   HGB 11.2* 11.4* 9.6* 7.8* 8.5*  HCT 36.1* 36.4* 30.5* 26.1* 28.5*  MCV 90.9 92.4 90.2 93.9 95.3  PLT 174 160 115* 79* 94*    Basic Metabolic Panel:  Recent Labs  Lab 07/11/18 1043 07/11/18 1726 07/12/18 0417 07/13/18 0500 07/13/18 2205 07/14/18 0510  NA  --  136 137 136 136 137  K  --  3.9 4.3 4.9 3.2* 3.4*  CL  --  105 104 102 100 100  CO2  --  22 24 25 28 28   GLUCOSE  --  82 105* 144* 126* 135*  BUN  --  56* 35* 54* 21 27*  CREATININE  --  10.13* 6.70* 7.50* 3.81* 4.50*  CALCIUM  --  8.2* 8.2* 8.6* 8.6* 8.7*  MG 1.9 2.0 2.2 2.2  --  2.0  PHOS 3.6 3.2  3.2 2.3* 2.8 2.0* 2.1*   GFR: Estimated Creatinine Clearance: 18.2 mL/min (A) (by  C-G formula based on SCr of 4.5 mg/dL (H)). Recent Labs  Lab 07/10/18 0034 07/10/18 2049 07/10/18 2236 07/11/18 0341 07/11/18 1043 07/12/18 0417 07/13/18 0500 07/14/18 0510  PROCALCITON  --   --  0.89 0.87  --  0.62  --   --   WBC 11.3* 11.7*  --  6.8  --   --  4.5 5.4  LATICACIDVEN 1.6  --  4.7* 1.9 1.3  --   --   --     Liver Function Tests: Recent Labs  Lab 07/09/18 2149 07/10/18 0034  07/11/18 1726 07/12/18 0417 07/13/18 0500 07/13/18 2205 07/14/18 0510  AST 15 19  --   --   --   --   --   --   ALT <5 7  --   --   --   --   --   --   ALKPHOS 65 62  --   --   --   --   --   --   BILITOT 1.0 0.9  --   --   --   --   --   --   PROT 7.9 7.2  --   --   --   --   --   --   ALBUMIN 3.6 3.4*   < > 2.8* 2.6* 2.7* 2.8* 2.7*   < > = values in this interval not displayed.   No results for input(s): LIPASE, AMYLASE in the last 168 hours. Recent Labs  Lab 07/10/18 0034 07/10/18 2236  AMMONIA 40* 38*    ABG    Component Value Date/Time   PHART 7.317 (L) 07/11/2018 0440   PCO2ART 38.0 07/11/2018 0440   PO2ART 154 (H) 07/11/2018 0440   HCO3 19.3 (L) 07/11/2018 0440   TCO2 29 05/01/2018 2132   ACIDBASEDEF 6.0 (H) 07/11/2018 0440   O2SAT 98.7 07/11/2018 0440     Coagulation Profile: No results for input(s): INR, PROTIME in the last 168 hours.  Cardiac Enzymes: Recent Labs  Lab 07/10/18 2236 07/11/18 0341 07/11/18 1043  TROPONINI 0.07* 0.09* 0.07*    HbA1C: Hgb A1c MFr Bld  Date/Time Value Ref Range Status  07/14/2018 05:10 AM 4.1 (L) 4.8 - 5.6 % Final    Comment:    (NOTE) Pre diabetes:          5.7%-6.4% Diabetes:              >6.4% Glycemic control for   <7.0% adults with diabetes   06/03/2011 05:45 AM 5.3 <5.7 % Final    Comment:    (NOTE)  According to the ADA Clinical Practice Recommendations for 2011, when HbA1c is used as a screening test:  >=6.5%   Diagnostic of Diabetes  [place]           (if abnormal result is confirmed) 5.7-6.4%   Increased risk of developing Diabetes Mellitus References:Diagnosis and Classification of Diabetes Mellitus,Diabetes LFYB,0175,10(CHENI 1):S62-S69 and Standards of Medical Care in         Diabetes - 2011,Diabetes DPOE,4235,36 (Suppl 1):S11-S61.    CBG: Recent Labs  Lab 07/13/18 1455 07/13/18 1912 07/13/18 2328 07/14/18 0312 07/14/18 0904  GLUCAP 150* 142* 132* 120* 106*      Critical care time: 31 minutes     Roselie Awkward, MD [facility] Pager: [phone] Cell: [phone] After 3pm or if no response, call [phone]

## 2018-07-14 NOTE — Progress Notes (Signed)
STROKE TEAM PROGRESS NOTE   SUBJECTIVE (INTERVAL HISTORY) No family is at the bedside.  Pt is extubated and tolerating well. Still hypophonia but able to following all commands and asking to go home. I educated him on medical compliance with medication and HD.    OBJECTIVE Temp:  [98.5 F (36.9 C)-99.6 F (37.6 C)] 98.5 F (36.9 C) (10/24 1205) Pulse Rate:  [58-88] 61 (10/24 1400) Cardiac Rhythm: Normal sinus rhythm;Heart block (10/24 1200) Resp:  [16-30] 23 (10/24 1400) BP: (116-218)/(52-81) 141/62 (10/24 1400) SpO2:  [98 %-100 %] 98 % (10/24 1400) FiO2 (%):  [30 %] 30 % (10/24 0800) Weight:  [77.4 kg-77.7 kg] 77.7 kg (10/24 0228)  Recent Labs  Lab 07/13/18 1912 07/13/18 2328 07/14/18 0312 07/14/18 0904 07/14/18 1207  GLUCAP 142* 132* 120* 106* 121*   Recent Labs  Lab 07/11/18 1043 07/11/18 1726 07/12/18 0417 07/13/18 0500 07/13/18 2205 07/14/18 0510  NA  --  136 137 136 136 137  K  --  3.9 4.3 4.9 3.2* 3.4*  CL  --  105 104 102 100 100  CO2  --  22 24 25 28 28   GLUCOSE  --  82 105* 144* 126* 135*  BUN  --  56* 35* 54* 21 27*  CREATININE  --  10.13* 6.70* 7.50* 3.81* 4.50*  CALCIUM  --  8.2* 8.2* 8.6* 8.6* 8.7*  MG 1.9 2.0 2.2 2.2  --  2.0  PHOS 3.6 3.2  3.2 2.3* 2.8 2.0* 2.1*   Recent Labs  Lab 07/09/18 2149 07/10/18 0034  07/11/18 1726 07/12/18 0417 07/13/18 0500 07/13/18 2205 07/14/18 0510  AST 15 19  --   --   --   --   --   --   ALT <5 7  --   --   --   --   --   --   ALKPHOS 65 62  --   --   --   --   --   --   BILITOT 1.0 0.9  --   --   --   --   --   --   PROT 7.9 7.2  --   --   --   --   --   --   ALBUMIN 3.6 3.4*   < > 2.8* 2.6* 2.7* 2.8* 2.7*   < > = values in this interval not displayed.   Recent Labs  Lab 07/10/18 0034 07/10/18 2049 07/11/18 0341 07/13/18 0500 07/14/18 0510  WBC 11.3* 11.7* 6.8 4.5 5.4  NEUTROABS  --   --   --  3.8  --   HGB 11.2* 11.4* 9.6* 7.8* 8.5*  HCT 36.1* 36.4* 30.5* 26.1* 28.5*  MCV 90.9 92.4 90.2 93.9  95.3  PLT 174 160 115* 79* 94*   Recent Labs  Lab 07/10/18 2236 07/11/18 0341 07/11/18 1043  TROPONINI 0.07* 0.09* 0.07*   No results for input(s): LABPROT, INR in the last 72 hours. No results for input(s): COLORURINE, LABSPEC, [place], GLUCOSEU, HGBUR, BILIRUBINUR, KETONESUR, PROTEINUR, UROBILINOGEN, NITRITE, LEUKOCYTESUR in the last 72 hours.  Invalid input(s): APPERANCEUR     Component Value Date/Time   CHOL 98 07/14/2018 0510   TRIG 70 07/14/2018 0510   HDL 35 (L) 07/14/2018 0510   CHOLHDL 2.8 07/14/2018 0510   VLDL 14 07/14/2018 0510   LDLCALC 49 07/14/2018 0510   LDLCALC 72 11/01/2017 1008   Lab Results  Component Value Date   HGBA1C 4.1 (L) 07/14/2018  Component Value Date/Time   LABOPIA NONE DETECTED 06/02/2011 0948   COCAINSCRNUR POSITIVE (A) 06/02/2011 0948   LABBENZ NONE DETECTED 06/02/2011 0948   AMPHETMU NONE DETECTED 06/02/2011 0948   THCU NONE DETECTED 06/02/2011 0948   LABBARB NONE DETECTED 06/02/2011 0948    No results for input(s): ETH in the last 168 hours.  I have personally reviewed the radiological images below and agree with the radiology interpretations.  Ct Head Wo Contrast  Result Date: 07/10/2018 CLINICAL DATA:  Seizure.  HIV.  Inpatient. EXAM: CT HEAD WITHOUT CONTRAST TECHNIQUE: Contiguous axial images were obtained from the base of the skull through the vertex without intravenous contrast. COMPARISON:  07/09/2018 head CT. FINDINGS: Brain: No evidence of parenchymal hemorrhage or extra-axial fluid collection. No mass lesion, mass effect, or midline shift. No CT evidence of acute infarction. Nonspecific moderate subcortical and periventricular white matter hypodensity, most in keeping with chronic small vessel ischemic change. Generalized cerebral volume loss. Cerebral ventricle sizes are stable and concordant with the degree of cerebral volume loss. Vascular: No acute abnormality. Skull: No evidence of calvarial fracture. Sinuses/Orbits:  The visualized paranasal sinuses are essentially clear. Other: Partial right mastoid effusion. Clear left mastoid air cells. IMPRESSION: 1. No evidence of acute intracranial abnormality. No evidence of calvarial fracture. 2. Generalized cerebral volume loss and moderate chronic small vessel ischemic changes in the cerebral white matter. 3. Partial right mastoid effusion. Electronically Signed   By: Ilona Sorrel M.D.   On: 07/10/2018 23:12   Ct Head Wo Contrast  Result Date: 07/09/2018 CLINICAL DATA:  Altered level of consciousness (LOC), unexplained. Seizure. EXAM: CT HEAD WITHOUT CONTRAST TECHNIQUE: Contiguous axial images were obtained from the base of the skull through the vertex without intravenous contrast. COMPARISON:  Head CT 06/04/2018 FINDINGS: Brain: Unchanged degree of atrophy and moderate to advanced chronic small vessel ischemia from prior exam. No intracranial hemorrhage, mass effect, or midline shift. No hydrocephalus. The basilar cisterns are patent. No evidence of territorial infarct or acute ischemia. No extra-axial or intracranial fluid collection. Vascular: No hyperdense vessel. Skull: No fracture or focal lesion. Sinuses/Orbits: Unchanged mild opacification of right mastoid air cells. No acute findings. Prior left cataract resection. Other: None. IMPRESSION: 1.  No acute intracranial abnormality. 2. Unchanged atrophy and chronic small vessel ischemia. Electronically Signed   By: Keith Rake M.D.   On: 07/09/2018 23:40   Mr Brain Wo Contrast  Result Date: 07/12/2018 CLINICAL DATA:  62 year old male with HIV and in stage renal disease on dialysis status post seizures. EXAM: MRI HEAD WITHOUT CONTRAST TECHNIQUE: Multiplanar, multiecho pulse sequences of the brain and surrounding structures were obtained without intravenous contrast. COMPARISON:  Head CT 07/10/2018 and earlier.  Brain MRI 04/11/2018. FINDINGS: Brain: There is a linear 12 millimeter focus of restricted diffusion in the  anterior left frontal lobe subcortical white matter (series 5, image 63). Associated T2 and FLAIR hyperintensity with no hemorrhage or mass effect. Superimposed advanced chronic cerebral white matter T2 and FLAIR hyperintensity. There is a small area of faint ring like trace diffusion abnormality in the anterior right corona radiata on series 5, image 68 with associated T2 and FLAIR hyperintensity which is new since July. No other restricted diffusion. No midline shift, mass effect, evidence of mass lesion, ventriculomegaly, extra-axial collection or acute intracranial hemorrhage. Cervicomedullary junction and pituitary are within normal limits. Stable cerebral volume. Scattered chronic microhemorrhages in the brain are stable since July except for that in the left temporal lobe on series 11, image  24. No cortical encephalomalacia identified. Relatively little signal abnormality in the deep gray matter nuclei, although a small lacune in the left thalamus on series 9, image 15 is new since July. Thin slice temporal lobe imaging. The hippocampal formations appear symmetric and within normal limits. Vascular: Major intracranial vascular flow voids are stable. Skull and upper cervical spine: Negative visible cervical spine. Visualized bone marrow signal is within normal limits. Sinuses/Orbits: Stable and negative. Other: Intubated. Small volume fluid in the pharynx. Stable mild right mastoid effusion. IMPRESSION: 1. Small acute white matter infarct in the anterior left frontal lobe. No associated hemorrhage or mass effect. 2. Small late subacute to early chronic white matter lacune in the anterior right corona radiata. Also, small lacunar infarct in the left thalamus and microhemorrhage in the left temporal lobe are chronic but new since July. 3. Underlying advanced chronic cerebral white matter disease and scattered chronic microhemorrhages. Electronically Signed   By: Genevie Ann M.D.   On: 07/12/2018 16:07   Carotid  Doppler Bilateral: 1-39% ICA stenosis. Vertebral artery flow is antegrade.  TTE - Severe LVH with apical trabeculation. Moderate-severe aortic   regurgitation. Large left pleural effision. Cardiac MRI could   assess both aortic regurgitation and LV noncompaction.  MRA head - negative    PHYSICAL EXAM  Temp:  [98.5 F (36.9 C)-99.6 F (37.6 C)] 98.5 F (36.9 C) (10/24 1205) Pulse Rate:  [58-88] 61 (10/24 1400) Resp:  [16-30] 23 (10/24 1400) BP: (116-218)/(52-81) 141/62 (10/24 1400) SpO2:  [98 %-100 %] 98 % (10/24 1400) FiO2 (%):  [30 %] 30 % (10/24 0800) Weight:  [77.4 kg-77.7 kg] 77.7 kg (10/24 0228)  General - Well nourished, well developed, lethargic but awake.  Ophthalmologic - fundi not visualized due to noncooperation.  Cardiovascular - Regular rate and rhythm.  Neuro - extubated, awake, although lethargic, but eyes open.  Able to follow simple commands, answer questions appropriately although hypophonia after extubation.  Continue to time, place, but not orientated to age.  Visual field full, PERRL, EOMI, facial symmetrical, tongue midline.  Moving all extremities symmetrically, 4/5.  DTR diminished, no Babinski. Sensation symmetrical bilaterally, coordination intact both hands and gait not tested.    ASSESSMENT/PLAN [name] is a 62 y.o. male with history of aortic valve severe regurgitation due to endocarditis, ESRD on HD, chronic anemia, HIV, hep B, hypertension, seizure, OSA, CAD, thrombocytopenia admitted for uremia, seizure and encephalopathy.  Seizure controlled after increase Vimpat dose, long-term EEG negative x2.  MRI showed left frontal white matter small infarct.  No tPA given due to outside window.    Stroke, likely incidental:  left frontal juxtacortical small infarct, etiology unclear, could be due to small vessel disease vs. Cardioembolic source given severe AVR, LAE, non-compaction and hx of endocarditis  Asymptomatic from stroke standpoint  CT  no acute abnormality  MRI left frontal white matter small infarct  MRA unremarkable  Carotid Doppler unremarkable  2D Echo EF 55 to 60%, severe LVH with apical trabeculation, moderate to severe aortic regurgitation.  LDL 49  HgbA1c 4.1  SCDs for VTE prophylaxis given thrombocytopenia  NPO   No antithrombotic prior to admission, now on aspirin 81 given improved thrombocytopenia and anemia  Ongoing aggressive stroke risk factor management  Therapy recommendations:  pending  Disposition:  pending  Seizure  Hx of seizure following with Dr. 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  TTE EF 55 to 60%, severe LVH with apical trabeculation, moderate to severe aortic regurgitation.  CT surgery on board and to consider potential AVR  Thrombocytopenia  Platelet [id number]  Off heparin or Lovenox  CCM on board  Close monitoring  Put on ASA 81 for stroke prevention  Anemia  Likely due to ESRD and iron deficiency  On iron infusion  Hemoglobin 12.5-11.2-9.6-7.8-> 8.5  Put on aspirin 81 for stroke prevention  CBC monitoring  Uremia and ESRD on hemodialysis  Admitted for uremia with missed several HDs  Nephrology on board  On dialysis   Hypertension emergency . High on admission with seizure . Currently BP still high . On p.o. amlodipine, Coreg,  clonidine, hydralazine . Still on Cardene IV infusion . Wean off Cardene as able  Long term BP goal normotensive  Encephalopathy  Improved  Likely due to multifactorial with uremia, ESRD, hypertensive emergency, seizure  Left frontal small infarct to small to explain mental status change.  Continue monitoring  HIV  On HARRT therapy  CD4 168/2019, down from 390 in 10/2017  Recheck CD4 pending  Afebrile this time  Other Stroke Risk Factors  Advanced age  Hx stroke/TIA -by imaging, left thalamic small lacunar  CAD  OSA  Other Active Problems  Hepatitis B  Hospital day # 4  This patient is critically ill due to stroke, seizure, respiratory failure, thrombocytopenia, anemia, severe AVR, LAE and non-compaction and at significant risk of neurological worsening, death form recurrent stroke, hemorrhagic conversion, status epilepticus, hemorrhage, heart failure, cardiac arrest. This patient's care requires constant monitoring of vital signs, hemodynamics, respiratory and cardiac monitoring, review of multiple databases, neurological assessment, discussion with family, other specialists and medical decision making of high complexity. I spent 35 minutes of neurocritical care time in the care of this patient.  Neurology will sign off. Please call with questions. Pt will follow up with Dr. Krista Blue at [facility] in about 4 weeks. Thanks for the consult.  Rosalin Hawking, MD PhD Stroke Neurology 07/14/2018 3:05 PM    To contact Stroke Continuity provider, please refer to [email or web address] After hours, contact General Neurology

## 2018-07-14 NOTE — Progress Notes (Signed)
Transported pt to and from MRI without event. RT will continue to monitor as needed.

## 2018-07-14 NOTE — Progress Notes (Signed)
Subjective:  HD yest- removed 3000 - tolerated well- BP back up this AM- still intubated   Objective Vital signs in last 24 hours: Vitals:   07/14/18 0500 07/14/18 0600 07/14/18 0700 07/14/18 0800  BP: (!) 198/81 (!) 179/65 (!) 174/65 (!) 218/75  Pulse: (!) 58 (!) 58 62 70  Resp: _0 (!) 24  Temp:      TempSrc:      SpO2: 99% 99% 99% 100%  Weight:      Height:       Weight change: 1.5 kg  Intake/Output Summary (Last 24 hours) at 07/14/2018 0904 Last data filed at 07/14/2018 0600 Gross per 24 hour  Intake 1407.18 ml  Output 3000 ml  Net -1592.82 ml    Dialysis Orders:  Home HD - 4d/week, 3.5hr, EDW 76kg, AVF with buttonholes  Assessment/Plan: 1.  Seizure: Known seizure d/o - on Vimpat prior to admit, likely worsened by uremia. CT neg for acute finding. Neuro involved- EEG has not shown cont sz on meds (vimpat)  2.  AMS: MRI did show small CVA.  Now uremia is better- more alert but not extubated yet 3.  ESRD: Usual home HD - last dialyzed 10/12 at home, BUN/Cr high on admit.  Tried to do IHD on 10/20- had sz- then moved to ICU for CRRT  from 10/20-10/22 then IHD at bedside 10/23- will plan for again on 10/25.  Could remove temp HD line if not needed for access  4.  Hypertension/volume:  BP was very high, no peripheral edema  Highly variable- norvasc 10, coreg 25 BID, clonidine 0.3 TID, hydralazine 100 TID- still not controlled -   also iv cardene- removed 3000 yest and will challenge again tomorrow  5.  Anemia: Hgb 11.2, dropped to the 9/s, now 7's. added ESA- iron stores low, repleting -  platelets dropping as well, will hold heparin and check HIT - hgb and plt rebounded some today  6.  Metabolic bone disease: Ca ok, Phos 2.1, binders not given, now held  7.  Hep B + 8.  HIV    Louis Meckel    Labs: Basic Metabolic Panel: Recent Labs  Lab 07/13/18 0500 07/13/18 2205 07/14/18 0510  NA 136 136 137  K 4.9 3.2* 3.4*  CL 102 100 100  CO2 _1 GLUCOSE 144* 126* 135*  BUN 54* 21 27*  CREATININE 7.50* 3.81* 4.50*  CALCIUM 8.6* 8.6* 8.7*  PHOS 2.8 2.0* 2.1*   Liver Function Tests: Recent Labs  Lab 07/09/18 2149 07/10/18 0034  07/13/18 0500 07/13/18 2205 07/14/18 0510  AST 15 19  --   --   --   --   ALT <5 7  --   --   --   --   ALKPHOS 65 62  --   --   --   --   BILITOT 1.0 0.9  --   --   --   --   PROT 7.9 7.2  --   --   --   --   ALBUMIN 3.6 3.4*   < > 2.7* 2.8* 2.7*   < > = values in this interval not displayed.   No results for input(s): LIPASE, AMYLASE in the last 168 hours. Recent Labs  Lab 07/10/18 0034 07/10/18 2236  AMMONIA 40* 38*   CBC: Recent Labs  Lab 07/10/18 0034 07/10/18 2049 07/11/18 0341 07/13/18 0500 07/14/18 0510  WBC 11.3* 11.7* 6.8 4.5 5.4  NEUTROABS  --   --   --  3.8  --   HGB 11.2* 11.4* 9.6* 7.8* 8.5*  HCT 36.1* 36.4* 30.5* 26.1* 28.5*  MCV 90.9 92.4 90.2 93.9 95.3  PLT 174 160 115* 79* 94*   Cardiac Enzymes: Recent Labs  Lab 07/10/18 2236 07/11/18 0341 07/11/18 1043  TROPONINI 0.07* 0.09* 0.07*   CBG: Recent Labs  Lab 07/13/18 1055 07/13/18 1455 07/13/18 1912 07/13/18 2328 07/14/18 0312  GLUCAP 109* 150* 142* 132* 120*    Iron Studies:  Recent Labs    07/13/18 0500  IRON 20*  TIBC NOT CALCULATED  FERRITIN 490*   Studies/Results: Mr [facility] Wo Contrast  Result Date: 07/14/2018 CLINICAL DATA:  Somnolent. Follow-up infarct. History of end-stage renal disease on dialysis, hepatitis, HIV. EXAM: MRA HEAD WITHOUT CONTRAST TECHNIQUE: Angiographic images of the Circle of Willis were obtained using MRA technique without intravenous contrast. COMPARISON:  MRI head July 12, 2017 FINDINGS: ANTERIOR CIRCULATION: Normal flow related enhancement of the included cervical, petrous, cavernous and supraclinoid internal carotid arteries. Patent anterior communicating artery. Patent anterior and middle cerebral arteries. No large vessel occlusion, flow limiting stenosis,  aneurysm. POSTERIOR CIRCULATION: Codominant vertebral arteries. Vertebrobasilar arteries are patent, with normal flow related enhancement of the main branch vessels. Fenestrated proximal basilar artery. Patent posterior cerebral arteries. Small RIGHT posterior communicating artery present. No large vessel occlusion, flow limiting stenosis,  aneurysm. ANATOMIC VARIANTS: None. Source images and MIP images were reviewed. IMPRESSION: Negative MRA head. Electronically Signed   By: Elon Alas M.D.   On: 07/14/2018 01:43   Mr Brain Wo Contrast  Result Date: 07/12/2018 CLINICAL DATA:  62 year old male with HIV and in stage renal disease on dialysis status post seizures. EXAM: MRI HEAD WITHOUT CONTRAST TECHNIQUE: Multiplanar, multiecho pulse sequences of the brain and surrounding structures were obtained without intravenous contrast. COMPARISON:  Head CT 07/10/2018 and earlier.  Brain MRI 04/11/2018. FINDINGS: Brain: There is a linear 12 millimeter focus of restricted diffusion in the anterior left frontal lobe subcortical white matter (series 5, image 63). Associated T2 and FLAIR hyperintensity with no hemorrhage or mass effect. Superimposed advanced chronic cerebral white matter T2 and FLAIR hyperintensity. There is a small area of faint ring like trace diffusion abnormality in the anterior right corona radiata on series 5, image 68 with associated T2 and FLAIR hyperintensity which is new since July. No other restricted diffusion. No midline shift, mass effect, evidence of mass lesion, ventriculomegaly, extra-axial collection or acute intracranial hemorrhage. Cervicomedullary junction and pituitary are within normal limits. Stable cerebral volume. Scattered chronic microhemorrhages in the brain are stable since July except for that in the left temporal lobe on series 11, image 24. No cortical encephalomalacia identified. Relatively little signal abnormality in the deep gray matter nuclei, although a small  lacune in the left thalamus on series 9, image 15 is new since July. Thin slice temporal lobe imaging. The hippocampal formations appear symmetric and within normal limits. Vascular: Major intracranial vascular flow voids are stable. Skull and upper cervical spine: Negative visible cervical spine. Visualized bone marrow signal is within normal limits. Sinuses/Orbits: Stable and negative. Other: Intubated. Small volume fluid in the pharynx. Stable mild right mastoid effusion. IMPRESSION: 1. Small acute white matter infarct in the anterior left frontal lobe. No associated hemorrhage or mass effect. 2. Small late subacute to early chronic white matter lacune in the anterior right corona radiata. Also, small lacunar infarct in the left thalamus and microhemorrhage in the left temporal lobe are chronic but new since July. 3. Underlying advanced  chronic cerebral white matter disease and scattered chronic microhemorrhages. Electronically Signed   By: Genevie Ann M.D.   On: 07/12/2018 16:07   Dg Chest Port 1 View  Result Date: 07/13/2018 CLINICAL DATA:  Acute respiratory failure with hypoxemia EXAM: PORTABLE CHEST 1 VIEW COMPARISON:  07/12/2018 FINDINGS: Endotracheal tube tip 4.2 cm above the carina. Nasogastric tube enters the stomach. Right IJ central line tip: Upper SVC. Continued left basilar airspace opacity with obscuration of the left hemidiaphragm. The patient is rotated to the left on today's radiograph, reducing diagnostic sensitivity and specificity. Hazy opacity at the right lung base is stable. Borderline enlargement of the cardiopericardial silhouette. Severe degenerative left glenohumeral arthropathy. IMPRESSION: 1. Overall stable appearance of the chest, with airspace opacity obscuring the left hemidiaphragm and some hazy opacity at the right lung base. Based on recent imaging, a component of the left basilar density is probably attributable to pleural effusion. 2. Borderline enlargement of the  cardiopericardial silhouette. 3. Severe degenerative left glenohumeral arthropathy. Electronically Signed   By: Van Clines M.D.   On: 07/13/2018 10:30   Medications: Infusions: . sodium chloride Stopped (07/12/18 2234)  . sodium chloride    . anticoagulant sodium citrate    . feeding supplement (VITAL 1.5 CAL) 1,000 mL (07/14/18 0426)  . ferric gluconate (FERRLECIT/NULECIT) IV Stopped (07/13/18 1041)  . niCARDipine 5 mg/hr (07/14/18 0825)  . potassium PHOSPHATE IVPB (in mmol) 10 mmol (07/14/18 0337)    Scheduled Medications: . amLODipine  10 mg Oral Daily  . bictegravir-emtricitabine-tenofovir AF  1 tablet Oral Daily  . carvedilol  25 mg Oral BID WC  . chlorhexidine gluconate (MEDLINE KIT)  15 mL Mouth Rinse BID  . Chlorhexidine Gluconate Cloth  6 each Topical Q0600  . cloNIDine  0.3 mg Oral TID  . darbepoetin (ARANESP) injection - DIALYSIS  60 mcg Intravenous Q Wed-HD  . famotidine  20 mg Per Tube Daily  . feeding supplement (PRO-STAT SUGAR FREE 64)  30 mL Per Tube TID  . hydrALAZINE  100 mg Oral TID  . insulin aspart  1-3 Units Subcutaneous Q4H  . ketorolac  1 drop Left Eye QID  . lacosamide  150 mg Oral BID  . mouth rinse  15 mL Mouth Rinse 10 times per day  . multivitamin  1 tablet Oral Daily  . ofloxacin  1 drop Left Eye QID  . prednisoLONE acetate  1 drop Left Eye QID  . sodium chloride flush  3 mL Intravenous Q12H  . sodium chloride flush  3 mL Intravenous Q12H    have reviewed scheduled and prn medications.  Physical Exam: General: intubated, more alert this AM Heart: RRR Lungs: mostly clear- 30 % fio2 Abdomen: soft, non tender Extremities: no edema Dialysis Access: has left AVF- patent- also right IJ temp cath placed 10/20   07/14/2018,9:04 AM  LOS: 4 days

## 2018-07-15 ENCOUNTER — Inpatient Hospital Stay (HOSPITAL_COMMUNITY): Admit: 2018-07-15 | Payer: Medicare Other

## 2018-07-15 ENCOUNTER — Encounter (HOSPITAL_COMMUNITY): Payer: Medicare Other

## 2018-07-15 LAB — CBC WITH DIFFERENTIAL/PLATELET
Abs Immature Granulocytes: 0.02 10*3/uL (ref 0.00–0.07)
BASOS ABS: 0 10*3/uL (ref 0.0–0.1)
Basophils Relative: 1 %
Eosinophils Absolute: 0.9 10*3/uL — ABNORMAL HIGH (ref 0.0–0.5)
Eosinophils Relative: 13 %
HEMATOCRIT: 29.3 % — AB (ref 39.0–52.0)
HEMOGLOBIN: 8.6 g/dL — AB (ref 13.0–17.0)
Immature Granulocytes: 0 %
LYMPHS ABS: 0.8 10*3/uL (ref 0.7–4.0)
LYMPHS PCT: 12 %
MCH: 27.9 pg (ref 26.0–34.0)
MCHC: 29.4 g/dL — ABNORMAL LOW (ref 30.0–36.0)
MCV: 95.1 fL (ref 80.0–100.0)
Monocytes Absolute: 0.4 10*3/uL (ref 0.1–1.0)
Monocytes Relative: 7 %
NEUTROS PCT: 67 %
NRBC: 0 % (ref 0.0–0.2)
Neutro Abs: 4.3 10*3/uL (ref 1.7–7.7)
Platelets: 145 10*3/uL — ABNORMAL LOW (ref 150–400)
RBC: 3.08 MIL/uL — ABNORMAL LOW (ref 4.22–5.81)
RDW: 16.2 % — AB (ref 11.5–15.5)
WBC: 6.4 10*3/uL (ref 4.0–10.5)

## 2018-07-15 LAB — GLUCOSE, CAPILLARY
GLUCOSE-CAPILLARY: 104 mg/dL — AB (ref 70–99)
GLUCOSE-CAPILLARY: 109 mg/dL — AB (ref 70–99)
GLUCOSE-CAPILLARY: 99 mg/dL (ref 70–99)
Glucose-Capillary: 91 mg/dL (ref 70–99)
Glucose-Capillary: 125 mg/dL — ABNORMAL HIGH (ref 70–99)
Glucose-Capillary: 128 mg/dL — ABNORMAL HIGH (ref 70–99)

## 2018-07-15 LAB — BASIC METABOLIC PANEL
ANION GAP: 10 (ref 5–15)
BUN: 34 mg/dL — ABNORMAL HIGH (ref 8–23)
CHLORIDE: 102 mmol/L (ref 98–111)
CO2: 26 mmol/L (ref 22–32)
Calcium: 8.5 mg/dL — ABNORMAL LOW (ref 8.9–10.3)
Creatinine, Ser: 6.56 mg/dL — ABNORMAL HIGH (ref 0.61–1.24)
GFR calc non Af Amer: 8 mL/min — ABNORMAL LOW (ref >60)
GFR, EST AFRICAN AMERICAN: 9 mL/min — AB (ref >60)
Glucose, Bld: 95 mg/dL (ref 70–99)
Potassium: 3.7 mmol/L (ref 3.5–5.1)
Sodium: 138 mmol/L (ref 135–145)

## 2018-07-15 LAB — CULTURE, BLOOD (ROUTINE X 2)
Culture: NO GROWTH
Culture: NO GROWTH
Special Requests: ADEQUATE
Special Requests: ADEQUATE

## 2018-07-15 LAB — RENAL FUNCTION PANEL
ALBUMIN: 3 g/dL — AB (ref 3.5–5.0)
ANION GAP: 11 (ref 5–15)
Albumin: 2.8 g/dL — ABNORMAL LOW (ref 3.5–5.0)
Anion gap: 8 (ref 5–15)
BUN: 34 mg/dL — ABNORMAL HIGH (ref 8–23)
BUN: 12 mg/dL (ref 8–23)
CHLORIDE: 100 mmol/L (ref 98–111)
CO2: 26 mmol/L (ref 22–32)
CO2: 29 mmol/L (ref 22–32)
Calcium: 8.7 mg/dL — ABNORMAL LOW (ref 8.9–10.3)
Calcium: 8.5 mg/dL — ABNORMAL LOW (ref 8.9–10.3)
Chloride: 100 mmol/L (ref 98–111)
Creatinine, Ser: 6.41 mg/dL — ABNORMAL HIGH (ref 0.61–1.24)
Creatinine, Ser: 3.63 mg/dL — ABNORMAL HIGH (ref 0.61–1.24)
GFR calc Af Amer: 19 mL/min — ABNORMAL LOW (ref >60)
GFR calc non Af Amer: 8 mL/min — ABNORMAL LOW (ref >60)
GFR calc non Af Amer: 16 mL/min — ABNORMAL LOW (ref >60)
GFR, EST AFRICAN AMERICAN: 10 mL/min — AB (ref >60)
Glucose, Bld: 98 mg/dL (ref 70–99)
Glucose, Bld: 130 mg/dL — ABNORMAL HIGH (ref 70–99)
PHOSPHORUS: 1.5 mg/dL — AB (ref 2.5–4.6)
POTASSIUM: 3.4 mmol/L — AB (ref 3.5–5.1)
Phosphorus: 2.5 mg/dL (ref 2.5–4.6)
Potassium: 3.7 mmol/L (ref 3.5–5.1)
Sodium: 137 mmol/L (ref 135–145)
Sodium: 137 mmol/L (ref 135–145)

## 2018-07-15 LAB — T-HELPER CELLS (CD4) COUNT (NOT AT ARMC)
CD4 T CELL HELPER: 32 % — AB (ref 33–55)
CD4 T Cell Abs: 220 /uL — ABNORMAL LOW (ref 400–2700)

## 2018-07-15 LAB — MAGNESIUM: Magnesium: 1.9 mg/dL (ref 1.7–2.4)

## 2018-07-15 MED ORDER — ISOSORBIDE MONONITRATE 20 MG PO TABS
20.0000 mg | ORAL_TABLET | Freq: Two times a day (BID) | ORAL | Status: DC
  Administered 2018-07-15 – 2018-07-18 (×8): 20 mg via ORAL
  Filled 2018-07-15 (×10): qty 1

## 2018-07-15 MED ORDER — HEPARIN SODIUM (PORCINE) 5000 UNIT/ML IJ SOLN
5000.0000 [IU] | Freq: Three times a day (TID) | INTRAMUSCULAR | Status: DC
  Administered 2018-07-15 – 2018-07-19 (×11): 5000 [IU] via SUBCUTANEOUS
  Filled 2018-07-15 (×11): qty 1

## 2018-07-15 MED ORDER — PANTOPRAZOLE SODIUM 40 MG PO TBEC
40.0000 mg | DELAYED_RELEASE_TABLET | Freq: Every day | ORAL | Status: DC
  Administered 2018-07-15 – 2018-07-19 (×5): 40 mg via ORAL
  Filled 2018-07-15 (×5): qty 1

## 2018-07-15 NOTE — Progress Notes (Signed)
[facility] P[facility]M  Off cardene  Move to tele  [place], MD [place] P[facility]M Pager: [phone] Cell: [phone] After 3pm or if no response, call [phone]

## 2018-07-15 NOTE — Progress Notes (Addendum)
NAME:  Jeffrey [name], MRN:  [id number], DOB:  1955/11/22, LOS: 5 ADMISSION DATE:  07/09/2018, CONSULTATION DATE:  07/09/18 REFERRING MD:  Bonner Puna, CHIEF COMPLAINT:  Seizure  Brief History   62 y/o male with multiple medical problems admitted for encephalopathy presumably related to uremia, had multiple seizures after admission.  Has ESRD and HIV.  Past Medical History  HIV, OSA, ESRD Significant Hospital Events   10/19 Admission/Seizure >   Consults: date of consult/date signed off & final recs:  Neurology Renal  Procedures (surgical and bedside):  PIV's ETT 10/20>>  Significant Diagnostic Tests:  Head CT (07/09/18): no acute process Head CT (07/10/18): pending CXR (07/10/18): no acute process Continuous EEG 10/20: no evidence of subclinical seizure MRI 10/22 > New small acute white matter infract ant left frontal lobe, small late suba cute lacunae ant R corn rad advanced chronic cerebral white matter Echo 10/24 TTE> severe LVH, Mod-severe aortic regurg  Micro Data:  Blood cx (07/10/18): pending Sputum cx (07/10/18): pending  Antimicrobials:  None  Subjective:   Extubated yesterday HD again today Remains on cardene gtt   Objective   Blood pressure (!) 176/68, pulse (!) 59, temperature 98.4 F (36.9 C), temperature source Oral, resp. rate (!) 21, height 6' (1.829 m), weight 80.5 kg, SpO2 92 %.    FiO2 (%):  [30 %] 30 %   Intake/Output Summary (Last 24 hours) at 07/15/2018 0936 Last data filed at 07/15/2018 0700 Gross per 24 hour  Intake 1667.85 ml  Output -  Net 1667.85 ml   Filed Weights   07/14/18 0228 07/15/18 0433 07/15/18 0855  Weight: 77.7 kg 80.4 kg 80.5 kg    Examination:  General:  Resting comfortably in bed HENT: NCAT OP clear PULM: CTA B, normal effort CV: RRR, no mgr GI: BS+, soft, nontender MSK: normal bulk and tone Neuro: awake, alert, no distress, MAEW    Resolved Hospital Problem list     Assessment & Plan:  Status epilepticus:  no further seizures > continue Vimpat  New small acute frontal lobe stroke, likely embolic with severe valve disease > ASA 81mg  > secondary stroke prevention/orderset per neuro > OK to add full anticoagulation if neuro feels this is necessary > PT/OT/SLP evalu  Acute encephalopathy : improved due to stroke, uremia  Severe AVR > TCTS following  New thrombocytopenia > improving > HIT negative > add back sub q hep for DVT prophylaxis > continue ASA  Acute respiratory failure with hypoxemia: resolved > SLP eval > aspiration precautions  Severe hypertension post ischemic stroke, difficult to control > wean off cardene gtt > low Na diet > continue amlodipine, hydralazine, coreg, clonidine > increase Isosorbide  ESRD  > HD per renal  GERD > restart PPI    Disposition / Summary of Today's Plan 07/15/18   Read above To TRH    Diet: advance diet  Pain/Anxiety/Delirium protocol (if indicated):n/a VAP protocol (if indicated): n/a DVT prophylaxis: sub q heparin GI prophylaxis: n/a Hyperglycemia protocol: monitor glucose Mobility: PT consult, SLP consult, OT consult Code Status: full Family Communication: brother [name] updated bedside  Labs   CBC: Recent Labs  Lab 07/10/18 2049 07/11/18 0341 07/13/18 0500 07/14/18 0510 07/15/18 0558  WBC 11.7* 6.8 4.5 5.4 6.4  NEUTROABS  --   --  3.8  --  4.3  HGB 11.4* 9.6* 7.8* 8.5* 8.6*  HCT 36.4* 30.5* 26.1* 28.5* 29.3*  MCV 92.4 90.2 93.9 95.3 95.1  PLT 160 115* 79* 94* 145*  Basic Metabolic Panel: Recent Labs  Lab 07/11/18 1726 07/12/18 0417 07/13/18 0500 07/13/18 2205 07/14/18 0510 07/14/18 1841 07/15/18 0558  NA 136 137 136 136 137 138 137  K 3.9 4.3 4.9 3.2* 3.4* 3.8 3.7  CL 105 104 102 100 100 101 100  CO2 22 24 25 28 28 27 26   GLUCOSE 82 105* 144* 126* 135* 121* 98  BUN 56* 35* 54* 21 27* 32* 34*  CREATININE 10.13* 6.70* 7.50* 3.81* 4.50* 5.66* 6.41*  CALCIUM 8.2* 8.2* 8.6* 8.6* 8.7* 8.6* 8.7*  MG  2.0 2.2 2.2  --  2.0  --  1.9  PHOS 3.2  3.2 2.3* 2.8 2.0* 2.1* 2.5 2.5   GFR: Estimated Creatinine Clearance: 12.8 mL/min (A) (by C-G formula based on SCr of 6.41 mg/dL (H)). Recent Labs  Lab 07/10/18 0034  07/10/18 2236 07/11/18 0341 07/11/18 1043 07/12/18 0417 07/13/18 0500 07/14/18 0510 07/15/18 0558  PROCALCITON  --   --  0.89 0.87  --  0.62  --   --   --   WBC 11.3*   < >  --  6.8  --   --  4.5 5.4 6.4  LATICACIDVEN 1.6  --  4.7* 1.9 1.3  --   --   --   --    < > = values in this interval not displayed.    Liver Function Tests: Recent Labs  Lab 07/09/18 2149 07/10/18 0034  07/13/18 0500 07/13/18 2205 07/14/18 0510 07/14/18 1841 07/15/18 0558  AST 15 19  --   --   --   --   --   --   ALT <5 7  --   --   --   --   --   --   ALKPHOS 65 62  --   --   --   --   --   --   BILITOT 1.0 0.9  --   --   --   --   --   --   PROT 7.9 7.2  --   --   --   --   --   --   ALBUMIN 3.6 3.4*   < > 2.7* 2.8* 2.7* 2.9* 2.8*   < > = values in this interval not displayed.   No results for input(s): LIPASE, AMYLASE in the last 168 hours. Recent Labs  Lab 07/10/18 0034 07/10/18 2236  AMMONIA 40* 38*    ABG    Component Value Date/Time   PHART 7.317 (L) 07/11/2018 0440   PCO2ART 38.0 07/11/2018 0440   PO2ART 154 (H) 07/11/2018 0440   HCO3 19.3 (L) 07/11/2018 0440   TCO2 29 05/01/2018 2132   ACIDBASEDEF 6.0 (H) 07/11/2018 0440   O2SAT 98.7 07/11/2018 0440     Coagulation Profile: No results for input(s): INR, PROTIME in the last 168 hours.  Cardiac Enzymes: Recent Labs  Lab 07/10/18 2236 07/11/18 0341 07/11/18 1043  TROPONINI 0.07* 0.09* 0.07*    HbA1C: Hgb A1c MFr Bld  Date/Time Value Ref Range Status  07/14/2018 05:10 AM 4.1 (L) 4.8 - 5.6 % Final    Comment:    (NOTE) Pre diabetes:          5.7%-6.4% Diabetes:              >6.4% Glycemic control for   <7.0% adults with diabetes   06/03/2011 05:45 AM 5.3 <5.7 % Final    Comment:    (NOTE)  According to the ADA Clinical Practice Recommendations for 2011, when HbA1c is used as a screening test:  >=6.5%   Diagnostic of Diabetes Mellitus           (if abnormal result is confirmed) 5.7-6.4%   Increased risk of developing Diabetes Mellitus References:Diagnosis and Classification of Diabetes Mellitus,Diabetes ZGYF,7494,49(QPRFF 1):S62-S69 and Standards of Medical Care in         Diabetes - 2011,Diabetes MBWG,6659,93 (Suppl 1):S11-S61.    CBG: Recent Labs  Lab 07/14/18 1629 07/14/18 1936 07/14/18 2342 07/15/18 0430 07/15/18 0714  GLUCAP 143* 112* 114* 99 91      Roselie Awkward, MD [facility] Pager: [phone] Cell: [phone] After 3pm or if no response, call [phone]

## 2018-07-15 NOTE — Progress Notes (Signed)
      [address]       [place] [address]             [phone]       Events noted I know Mr. Cuervo well Presented after seizure. Had another seizure on HD and was intubated Extubated yesterday MR showed a small stroke, possibly embolic Currently alert and in no distress Appears severely deconditioned.  Missed cath that had been scheduled for 10/21 Echo showed moderate to severe AI, preserved LV systolic function  Was scheduled for AVR on Wed 10/30. We will likely need to push that back a week or two while he recovers from this episode  Remo Lipps C. Roxan Hockey, MD [facility] [phone]

## 2018-07-15 NOTE — Progress Notes (Signed)
PT Cancellation Note  Patient Details Name: Jeffrey [name] MRN: [id number] DOB: [birth year]   Cancelled Treatment:    Reason Eval/Treat Not Completed: Other (comment)(Pt too tired after HD.  Will return tomorrow.  )   Denice Paradise 07/15/2018, 3:16 PM [place] Pager:  [phone]  Office:  [phone]

## 2018-07-15 NOTE — Progress Notes (Signed)
Subjective:  Extubated- BP OK but still on cardene drip  - HIT antibodies neg  Objective Vital signs in last 24 hours: Vitals:   07/15/18 0530 07/15/18 0600 07/15/18 0630 07/15/18 0716  BP: (!) 156/59 (!) 135/54 (!) 180/65   Pulse: 62 63 64   Resp: 19 (!) 23 (!) 21   Temp:    98.8 F (37.1 C)  TempSrc:    Oral  SpO2: 91% 94% 92%   Weight:      Height:       Weight change: -0.1 kg  Intake/Output Summary (Last 24 hours) at 07/15/2018 0838 Last data filed at 07/15/2018 0700 Gross per 24 hour  Intake 1693.95 ml  Output -  Net 1693.95 ml    Dialysis Orders:  Home HD - 4d/week, 3.5hr, EDW 76kg, AVF with buttonholes  Assessment/Plan: 1.  Seizure: Known seizure d/o - on Vimpat prior to admit, likely worsened by uremia. CT neg for acute finding. Neuro involved- EEG has not shown cont sz on meds 2.  AMS: MRI did show small CVA, not felt to be too clinically signif.  Now uremia is better- more alert but not extubated yet.  On ASA 3.  ESRD: Usual home HD - last dialyzed 10/12 at home, BUN/Cr high on admit.  Tried to do IHD on 10/20- had sz- then moved to ICU for CRRT  from 10/20-10/22 then IHD at bedside 10/23- will plan for again today.  Could remove temp HD line if not needed for access  4.  Hypertension/volume:  BP was very high, no peripheral edema  Highly variable- norvasc 10, coreg 25 BID, clonidine 0.3 TID, hydralazine 100 TID- still not controlled -   also iv cardene-  and will challenge again today with HD - he says his EDW is 60- is now 77 to 80- try to get off cardene today- pt says besrt BP he gets is 160 on 4 drug reg  5.  Anemia: Hgb 11.2, dropped to the 9/s, now 7's. added ESA- iron stores low, repleting -  platelets dropping as well, held heparin and check HIT - was neg-  hgb and plt rebounded - can re introduce heparin with HD  6.  Metabolic bone disease: Ca ok, Phos 2.5, binders not given, now held  7.  Hep B + 8.  HIV    Jeffrey [name]    Labs: Basic  Metabolic Panel: Recent Labs  Lab 07/14/18 0510 07/14/18 1841 07/15/18 0558  NA 137 138 137  K 3.4* 3.8 3.7  CL 100 101 100  CO2 28 27 26   GLUCOSE 135* 121* 98  BUN 27* 32* 34*  CREATININE 4.50* 5.66* 6.41*  CALCIUM 8.7* 8.6* 8.7*  PHOS 2.1* 2.5 2.5   Liver Function Tests: Recent Labs  Lab 07/09/18 2149 07/10/18 0034  07/14/18 0510 07/14/18 1841 07/15/18 0558  AST 15 19  --   --   --   --   ALT <5 7  --   --   --   --   ALKPHOS 65 62  --   --   --   --   BILITOT 1.0 0.9  --   --   --   --   PROT 7.9 7.2  --   --   --   --   ALBUMIN 3.6 3.4*   < > 2.7* 2.9* 2.8*   < > = values in this interval not displayed.   No results for input(s): LIPASE, AMYLASE in  the last 168 hours. Recent Labs  Lab 07/10/18 0034 07/10/18 2236  AMMONIA 40* 38*   CBC: Recent Labs  Lab 07/10/18 2049 07/11/18 0341 07/13/18 0500 07/14/18 0510 07/15/18 0558  WBC 11.7* 6.8 4.5 5.4 6.4  NEUTROABS  --   --  3.8  --  4.3  HGB 11.4* 9.6* 7.8* 8.5* 8.6*  HCT 36.4* 30.5* 26.1* 28.5* 29.3*  MCV 92.4 90.2 93.9 95.3 95.1  PLT 160 115* 79* 94* 145*   Cardiac Enzymes: Recent Labs  Lab 07/10/18 2236 07/11/18 0341 07/11/18 1043  TROPONINI 0.07* 0.09* 0.07*   CBG: Recent Labs  Lab 07/14/18 1629 07/14/18 1936 07/14/18 2342 07/15/18 0430 07/15/18 0714  GLUCAP 143* 112* 114* 99 91    Iron Studies:  Recent Labs    07/13/18 0500  IRON 20*  TIBC NOT CALCULATED  FERRITIN 490*   Studies/Results: Mr [facility] Wo Contrast  Result Date: 07/14/2018 CLINICAL DATA:  Somnolent. Follow-up infarct. History of end-stage renal disease on dialysis, hepatitis, HIV. EXAM: MRA HEAD WITHOUT CONTRAST TECHNIQUE: Angiographic images of the Circle of Willis were obtained using MRA technique without intravenous contrast. COMPARISON:  MRI head July 12, 2017 FINDINGS: ANTERIOR CIRCULATION: Normal flow related enhancement of the included cervical, petrous, cavernous and supraclinoid internal carotid arteries.  Patent anterior communicating artery. Patent anterior and middle cerebral arteries. No large vessel occlusion, flow limiting stenosis, aneurysm. POSTERIOR CIRCULATION: Codominant vertebral arteries. Vertebrobasilar arteries are patent, with normal flow related enhancement of the main branch vessels. Fenestrated proximal basilar artery. Patent posterior cerebral arteries. Small RIGHT posterior communicating artery present. No large vessel occlusion, flow limiting stenosis,  aneurysm. ANATOMIC VARIANTS: None. Source images and MIP images were reviewed. IMPRESSION: Negative MRA head. Electronically Signed   By: Elon Alas M.D.   On: 07/14/2018 01:43   Dg Chest Port 1 View  Result Date: 07/14/2018 CLINICAL DATA:  Respiratory failure. EXAM: PORTABLE CHEST 1 VIEW COMPARISON:  07/13/2018 FINDINGS: Stable support apparatus. Enlarged cardiac silhouette.  Mediastinal contours appear intact. No evidence of pneumothorax. Mild interstitial pulmonary edema. Persistent left lung base opacity. Osseous structures are without acute abnormality. Soft tissues are grossly normal. IMPRESSION: Persistent left lung base opacity likely represents pleural effusion with associated atelectasis or airspace consolidation. Probable mild interstitial pulmonary edema. Electronically Signed   By: Fidela Salisbury M.D.   On: 07/14/2018 10:18   Medications: Infusions: . sodium chloride Stopped (07/12/18 2234)  . sodium chloride    . anticoagulant sodium citrate    . feeding supplement (VITAL 1.5 CAL) 1,000 mL (07/14/18 0426)  . ferric gluconate (FERRLECIT/NULECIT) IV Stopped (07/14/18 1202)  . niCARDipine 5 mg/hr (07/15/18 0433)    Scheduled Medications: . amLODipine  10 mg Oral Daily  . aspirin EC  81 mg Oral Daily  . bictegravir-emtricitabine-tenofovir AF  1 tablet Oral Daily  . carvedilol  25 mg Oral BID WC  . Chlorhexidine Gluconate Cloth  6 each Topical Q0600  . Chlorhexidine Gluconate Cloth  6 each Topical Q0600   . cloNIDine  0.3 mg Oral TID  . darbepoetin (ARANESP) injection - DIALYSIS  60 mcg Intravenous Q Wed-HD  . famotidine  20 mg Per Tube Daily  . hydrALAZINE  100 mg Oral TID  . insulin aspart  1-3 Units Subcutaneous Q4H  . isosorbide mononitrate  10 mg Oral BID  . ketorolac  1 drop Left Eye QID  . lacosamide  150 mg Oral BID  . multivitamin  1 tablet Oral Daily  . ofloxacin  1  drop Left Eye QID  . prednisoLONE acetate  1 drop Left Eye QID  . sodium chloride flush  3 mL Intravenous Q12H  . sodium chloride flush  3 mL Intravenous Q12H    have reviewed scheduled and prn medications.  Physical Exam: General: extubated, more alert this AM Heart: RRR Lungs: mostly clear-  Abdomen: soft, non tender Extremities: no edema Dialysis Access: has left AVF- patent- also right IJ temp cath placed 10/20   07/15/2018,8:38 AM  LOS: 5 days

## 2018-07-15 NOTE — Procedures (Signed)
Patient was seen on dialysis and the procedure was supervised.  BFR 400  Via AVF BP is  171/69.   Patient appears to be tolerating treatment well- BP runs high according to pt  Louis Meckel 07/15/2018

## 2018-07-15 NOTE — Evaluation (Signed)
Clinical/Bedside Swallow Evaluation Patient Details  Name: Jeffrey [name] MRN: [id number] Date of Birth: 1956/02/23  Today's Date: 07/15/2018 Time: SLP Start Time (ACUTE ONLY): 0949 SLP Stop Time (ACUTE ONLY): 1004 SLP Time Calculation (min) (ACUTE ONLY): 15 min  Past Medical History:  Past Medical History:  Diagnosis Date  . Anemia   . Anxiety   . Aortic valve stenosis   . Arthritis   . Asthma    per pt hx  . Dyspnea   . End stage renal disease on home HD 07/10/2011   Started HD in September 2012 at [name] with a tunneled HD catheter, now on home HD with NxtStage. Dialyzing through AVF L lower arm with buttonhole technique as of mid 2014. His brother does the HD treatments at home.  They are roommates for 23 years.  The brother works 3rd shift and gets off about 8am and then puts [facility] [name] on HD in the morning after getting home. Most of the time he does HD about 4 times a week, for about 4 hours per treatment. Cause of ESRD was HTN according to patient. He says he let his health go and ending up with complications, and that he didn't like seeing doctors in those days.  He says he was diagnosed with severe HTN when he lived in [place] in his 14's.   . Hepatitis    Hepatitis [name]  . Hepatitis [name] carrier ([facility] [name])   . HIV infection ([facility] [name])   . Hypertension   . Hyperthyroidism    normal now  . Pneumonia several yrs ago  . Seizure ([place])   . Seizures ([place]) 06/02/2011   had a mild one in Sept. 2019  . Sleep apnea    to start Cpap soon  . Thrombocytopenia ([place])    Past Surgical History:  Past Surgical History:  Procedure Laterality Date  . AV FISTULA PLACEMENT  06/02/11   Left radiocephalic AVF  . BIOPSY  02/17/2018   Procedure: BIOPSY;  Surgeon: Milus Banister, MD;  Location: [facility] ENDOSCOPY;  Service: Endoscopy;;  . COLONOSCOPY    . COLONOSCOPY WITH PROPOFOL N/A 01/27/2018   Procedure: COLONOSCOPY WITH PROPOFOL;  Surgeon: [name], MD;  Location: [facility] ENDOSCOPY;  Service: Gastroenterology;   Laterality: N/A;  . CORONARY ANGIOPLASTY    . ERCP  03/21/2018   AT [place]  . ESOPHAGOGASTRODUODENOSCOPY N/A 02/17/2018   Procedure: ESOPHAGOGASTRODUODENOSCOPY (EGD);  Surgeon: Milus Banister, MD;  Location: [facility] ENDOSCOPY;  Service: Endoscopy;  Laterality: N/A;  . ESOPHAGOGASTRODUODENOSCOPY (EGD) WITH PROPOFOL N/A 01/27/2018   Procedure: ESOPHAGOGASTRODUODENOSCOPY (EGD) WITH PROPOFOL;  Surgeon: [name], MD;  Location: [facility] ENDOSCOPY;  Service: Gastroenterology;  Laterality: N/A;  . ESOPHAGOGASTRODUODENOSCOPY (EGD) WITH PROPOFOL N/A 04/03/2018   Procedure: ESOPHAGOGASTRODUODENOSCOPY (EGD) WITH PROPOFOL;  Surgeon: Clarene Essex, MD;  Location: [place];  Service: Endoscopy;  Laterality: N/A;  . EUS N/A 02/17/2018   Procedure: UPPER ENDOSCOPIC ULTRASOUND (EUS) RADIAL;  Surgeon: Milus Banister, MD;  Location: [facility] ENDOSCOPY;  Service: Endoscopy;  Laterality: N/A;  . fistulaogram     x 2 last 2 years  . IR DIALY SHUNT INTRO NEEDLE/INTRACATH INITIAL W/IMG LEFT Left 04/20/2018  . IR FLUORO GUIDE CV LINE RIGHT  09/16/2017  . IR US GUIDE VASC ACCESS RIGHT  09/16/2017  . IRRIGATION AND DEBRIDEMENT KNEE Left 09/15/2017   Procedure: IRRIGATION AND DEBRIDEMENT KNEE; arthroscopic clean out;  Surgeon: [name], MD;  Location: [facility];  Service: Orthopedics;  Laterality: Left;  . OTHER SURGICAL HISTORY  removal temporary HD catheter   . REVISON OF ARTERIOVENOUS FISTULA Left 10/10/2015   Procedure: REVISON OF LEFT RADIOCEPHALIC ARTERIOVENOUS FISTULA;  Surgeon: Angelia Mould, MD;  Location: [place];  Service: Vascular;  Laterality: Left;  . REVISON OF ARTERIOVENOUS FISTULA Left 02/07/2016   Procedure: REPAIR OF PSEUDO-ANEUREYSM OF LEFT ARM  ARTERIOVENOUS FISTULA;  Surgeon: Angelia Mould, MD;  Location: [place];  Service: Vascular;  Laterality: Left;  . REVISON OF ARTERIOVENOUS FISTULA Left 0/53/9767   Procedure: PLICATION OF LEFT ARM RADIOCEPHALIC ARTERIOVENOUS FISTULA  PSEUDOANEURYSM;  Surgeon: Angelia Mould, MD;  Location: [place];  Service: Vascular;  Laterality: Left;  . REVISON OF ARTERIOVENOUS FISTULA Left 07/04/2018   Procedure: REVISION OF ARTERIOVENOUS RADIOCEPHALIC FISTULA;  Surgeon: Angelia Mould, MD;  Location: [place];  Service: Vascular;  Laterality: Left;  . RIGHT/LEFT HEART CATH AND CORONARY ANGIOGRAPHY N/A 02/17/2017   Procedure: Right/Left Heart Cath and Coronary Angiography;  Surgeon: Sherren Mocha, MD;  Location: [place] CV LAB;  Service: Cardiovascular;  Laterality: N/A;  . TEE WITHOUT CARDIOVERSION N/A 09/11/2016   Procedure: TRANSESOPHAGEAL ECHOCARDIOGRAM (TEE);  Surgeon: Dorothy Spark, MD;  Location: [place];  Service: Cardiovascular;  Laterality: N/A;  . TEE WITHOUT CARDIOVERSION N/A 05/05/2018   Procedure: TRANSESOPHAGEAL ECHOCARDIOGRAM (TEE);  Surgeon: Adrian Prows, MD;  Location: [facility] ENDOSCOPY;  Service: Cardiovascular;  Laterality: N/A;  Prefer after 3:30 PM   HPI:  Pt is a 62 y/o male admitted for encephalopathy presumably related to uremia. He had multiple seizures after admission and required intubation 10/20-10/24. MRI showed left frontal white matter small infarct (likely incidental finding per neurology). PMH: aortic valve severe regurgitation due to endocarditis, ESRD on HD, chronic anemia, HIV, hep [name], hypertension, seizure, OSA, CAD, thrombocytopenia admitted for uremia, seizure and encephalopathy   Assessment / Plan / Recommendation Clinical Impression  Pt's vocal intensity and volitional coughing are weak, but suspect that he is guarding (says it hurts if he coughs too hard) and has the potential to get more force. His vocal quality sounds clear despite hypophonia, and he and his nurse both report noticeable improvements in vocal quality since extubation on previous date. He consumed thin liquids and purees with a single throat clear that could not be duplicated. No coughing or signs of dysphagia observed.  Pt would only try purees, although he denies any difficulty with mastication even though his dentures are not present. Cognitively he appears very appropriate. Recommend continuing current diet (regular textures, thin liquids) but with SLP f/u to ensure tolerance given risk factors (acute CVA, deconditioning, recent intubation).  SLP Visit Diagnosis: Dysphagia, unspecified (R13.10)    Aspiration Risk  Mild aspiration risk    Diet Recommendation Regular;Thin liquid   Liquid Administration via: Cup;Straw Medication Administration: Whole meds with liquid Supervision: Patient able to self feed;Intermittent supervision to cue for compensatory strategies Compensations: Slow rate;Small sips/bites Postural Changes: Seated upright at 90 degrees    Other  Recommendations Oral Care Recommendations: Oral care BID   Follow up Recommendations None      Frequency and Duration min 2x/week  1 week       Prognosis        Swallow Study   General HPI: Pt is a 62 y/o male admitted for encephalopathy presumably related to uremia. He had multiple seizures after admission and required intubation 10/20-10/24. MRI showed left frontal white matter small infarct (likely incidental finding per neurology). PMH: aortic valve severe regurgitation due to endocarditis, ESRD on HD, chronic anemia,  HIV, hep [name], hypertension, seizure, OSA, CAD, thrombocytopenia admitted for uremia, seizure and encephalopathy Type of Study: Bedside Swallow Evaluation Previous Swallow Assessment: none in chart Diet Prior to this Study: Regular;Thin liquids Temperature Spikes Noted: No Respiratory Status: Room air History of Recent Intubation: Yes Length of Intubations (days): 4 days Date extubated: 07/14/18 Behavior/Cognition: Alert;Cooperative;Pleasant mood Oral Cavity Assessment: Within Functional Limits Oral Care Completed by SLP: No Oral Cavity - Dentition: Dentures, not available;Edentulous Vision: Functional for  self-feeding Self-Feeding Abilities: Able to feed self;Needs assist Patient Positioning: Upright in bed Baseline Vocal Quality: Low vocal intensity(suspect guarded) Volitional Cough: Weak(suspect guarded) Volitional Swallow: Able to elicit    Oral/Motor/Sensory Function Overall Oral Motor/Sensory Function: Within functional limits   Ice Chips Ice chips: Not tested   Thin Liquid Thin Liquid: Impaired Presentation: Cup;Self Fed;Straw Pharyngeal  Phase Impairments: Throat Clearing - Immediate(x1)    Nectar Thick Nectar Thick Liquid: Not tested   Honey Thick Honey Thick Liquid: Not tested   Puree Puree: Within functional limits Presentation: Self Fed;Spoon   Solid     Solid: Not tested(pt declined any solids)      Germain Osgood 07/15/2018,10:44 AM  Germain Osgood, M.A. [place] Acute Environmental education officer [phone] Office [phone]

## 2018-07-16 LAB — RENAL FUNCTION PANEL
ANION GAP: 10 (ref 5–15)
Albumin: 2.7 g/dL — ABNORMAL LOW (ref 3.5–5.0)
BUN: 20 mg/dL (ref 8–23)
CALCIUM: 8.6 mg/dL — AB (ref 8.9–10.3)
CHLORIDE: 99 mmol/L (ref 98–111)
CO2: 27 mmol/L (ref 22–32)
CREATININE: 4.82 mg/dL — AB (ref 0.61–1.24)
GFR calc Af Amer: 13 mL/min — ABNORMAL LOW (ref >60)
GFR calc non Af Amer: 12 mL/min — ABNORMAL LOW (ref >60)
GLUCOSE: 102 mg/dL — AB (ref 70–99)
Phosphorus: 2.1 mg/dL — ABNORMAL LOW (ref 2.5–4.6)
Potassium: 3.8 mmol/L (ref 3.5–5.1)
Sodium: 136 mmol/L (ref 135–145)

## 2018-07-16 LAB — GLUCOSE, CAPILLARY
GLUCOSE-CAPILLARY: 106 mg/dL — AB (ref 70–99)
GLUCOSE-CAPILLARY: 108 mg/dL — AB (ref 70–99)
Glucose-Capillary: 96 mg/dL (ref 70–99)
Glucose-Capillary: 109 mg/dL — ABNORMAL HIGH (ref 70–99)
Glucose-Capillary: 191 mg/dL — ABNORMAL HIGH (ref 70–99)

## 2018-07-16 LAB — TRIGLYCERIDES: Triglycerides: 77 mg/dL (ref <150)

## 2018-07-16 MED ORDER — LABETALOL HCL 200 MG PO TABS
200.0000 mg | ORAL_TABLET | Freq: Once | ORAL | Status: AC
  Administered 2018-07-16: 200 mg via ORAL
  Filled 2018-07-16: qty 1

## 2018-07-16 MED ORDER — HYDRALAZINE HCL 20 MG/ML IJ SOLN
10.0000 mg | INTRAMUSCULAR | Status: DC | PRN
  Administered 2018-07-16 – 2018-07-18 (×4): 10 mg via INTRAVENOUS
  Filled 2018-07-16 (×4): qty 1

## 2018-07-16 MED ORDER — CHLORHEXIDINE GLUCONATE CLOTH 2 % EX PADS: 6.0000 | MEDICATED_PAD | Freq: Every day | CUTANEOUS | Status: DC

## 2018-07-16 MED ORDER — HEPARIN SODIUM (PORCINE) 1000 UNIT/ML DIALYSIS: 20.0000 [IU]/kg | INTRAMUSCULAR | Status: DC | PRN

## 2018-07-16 NOTE — Progress Notes (Signed)
PT Cancellation Note  Patient Details Name: Jeffrey [name] MRN: [id number] DOB: 07-Oct-1955   Cancelled Treatment:    Reason Eval/Treat Not Completed: Other (comment)(Nurse wants PT to let pt sleep. will check back.  )   Denice Paradise 07/16/2018, 12:31 PM Antoni Stefan,PT Acute Rehabilitation Services Pager:  [phone]  Office:  [phone]

## 2018-07-16 NOTE — Progress Notes (Signed)
SLP Cancellation Note  Patient Details Name: Jeffrey [name] MRN: [id number] DOB: 1955-12-28   Cancelled treatment:       Reason Eval/Treat Not Completed: Pt sleeping; RN requests to let pt sleep. Will follow up.  Deneise Lever, [place], CCC-SLP Speech-Language Pathologist Acute Rehabilitation Services Pager: [phone] Office: [phone]    Aliene Altes 07/16/2018, 12:42 PM

## 2018-07-16 NOTE — Progress Notes (Signed)
PROGRESS NOTE    Jeffrey [name]  HWE:[id number] DOB: 07-Jul-1956 DOA: 07/09/2018 PCP: Velna Ochs, MD    Brief Narrative:  [id number] with hx of ESRD, HIV presenting with acute encephalopathy after not going to scheduled dialysis several days prior to admit. Patient developed seizures and required intubation. Pt since extubated and transferred to [facility] where he continues on HD per nephrology  Assessment & Plan:   Principal Problem:   Seizure ([place]) Active Problems:   End stage renal disease on home HD   Thrombocytopenia ([facility])   Anemia of chronic disease   Altered mental status   Seizure disorder ([place])   Status epilepticus ([place])   Cerebral thrombosis with cerebral infarction  Status epilepticus: no further seizures -Patient is continued on Vimpat -Neurology had been following. Stable at present  New small acute frontal lobe stroke, likely embolic with severe valve disease -Continue on ASA 81mg  -PT/Ot consulted, pending recs -Seen by Neurology  Acute encephalopathy -Likely secondary to uremia in additional above CVA -Conversant. Seems somewhat confused -Cont with HD as per below. Cont above meds for CVA  Severe AVR -Seen by CTS -Seems stable at present  New thrombocytopenia -HIT negative -Cont on sub q hep for DVT prophylaxis -continue ASA per above -Labs reviewed, improving  Acute respiratory failure with hypoxemia: resolved -SLP following -Continue with aspiration precautions  Severe hypertension post ischemic stroke, difficult to control -Currently on Cardene gtt, continue to wean off as tolerated -continue with low Na diet -continue amlodipine, hydralazine, coreg, clonidine -Recently increased Isosorbide -Have changed PRN hydralazine to cover sbp>160  ESRD  -Continue with HD per Nephrology  GERD -Continue on PPI as tolerated  DVT prophylaxis: Heparin subQ Code Status: Full Family Communication: Pt in room, family not at bedside Disposition  Plan: Uncertain at this time  Consultants:   Nephrology  CCM  CTS  Neurology  Procedures:     Antimicrobials: Anti-infectives (From admission, onward)   Start     Dose/Rate Route Frequency Ordered Stop   07/10/18 1600  bictegravir-emtricitabine-tenofovir AF (BIKTARVY) 50-200-25 MG per tablet 1 tablet     1 tablet Oral Daily 07/10/18 1416     07/10/18 1000  bictegravir-emtricitabine-tenofovir AF (BIKTARVY) 50-200-25 MG per tablet 1 tablet  Status:  Discontinued     1 tablet Oral Daily 07/10/18 0011 07/10/18 0021       Subjective: Somewhat confused. Asking for his BP meds this AM  Objective: Vitals:   07/16/18 1615 07/16/18 1624 07/16/18 1628 07/16/18 1719  BP: (!) 155/71 140/73  (!) 144/69  Pulse: 61 62    Resp: 19 (!) 22    Temp:  98 F (36.7 C) 98.2 F (36.8 C)   TempSrc:  Oral Oral   SpO2: 98% 98%    Weight:  72.9 kg    Height:        Intake/Output Summary (Last 24 hours) at 07/16/2018 1756 Last data filed at 07/16/2018 1624 Gross per 24 hour  Intake 1581.38 ml  Output 3000 ml  Net -1418.62 ml   Filed Weights   07/16/18 0500 07/16/18 1315 07/16/18 1624  Weight: 79.1 kg 75.9 kg 72.9 kg    Examination:  General exam: Appears calm and comfortable  Respiratory system: Clear to auscultation. Respiratory effort normal. Cardiovascular system: S1 & S2 heard, RRR Gastrointestinal system: Abdomen is nondistended, soft and nontender. No organomegaly or masses felt. Normal bowel sounds heard. Central nervous system: Alert and oriented. No focal neurological deficits. Extremities: Symmetric 5 x 5 power. Skin:  No rashes, lesions  Psychiatry: Somewhat confused, mood seems appropriate  Data Reviewed: I have personally reviewed following labs and imaging studies  CBC: Recent Labs  Lab 07/10/18 2049 07/11/18 0341 07/13/18 0500 07/14/18 0510 07/15/18 0558  WBC 11.7* 6.8 4.5 5.4 6.4  NEUTROABS  --   --  3.8  --  4.3  HGB 11.4* 9.6* 7.8* 8.5* 8.6*  HCT  36.4* 30.5* 26.1* 28.5* 29.3*  MCV 92.4 90.2 93.9 95.3 95.1  PLT 160 115* 79* 94* 540*   Basic Metabolic Panel: Recent Labs  Lab 07/11/18 1726 07/12/18 0417 07/13/18 0500  07/14/18 0510 07/14/18 1841 07/15/18 0558 07/15/18 1600 07/16/18 0526  NA 136 137 136   < > 137 138 138  137 137 136  K 3.9 4.3 4.9   < > 3.4* 3.8 3.7  3.7 3.4* 3.8  CL 105 104 102   < > 100 101 102  100 100 99  CO2 22 24 25    < > 28 27 26  26 29 27   GLUCOSE 82 105* 144*   < > 135* 121* 95  98 130* 102*  BUN 56* 35* 54*   < > 27* 32* 34*  34* 12 20  CREATININE 10.13* 6.70* 7.50*   < > 4.50* 5.66* 6.56*  6.41* 3.63* 4.82*  CALCIUM 8.2* 8.2* 8.6*   < > 8.7* 8.6* 8.5*  8.7* 8.5* 8.6*  MG 2.0 2.2 2.2  --  2.0  --  1.9  --   --   PHOS 3.2  3.2 2.3* 2.8   < > 2.1* 2.5 2.5 1.5* 2.1*   < > = values in this interval not displayed.   GFR: Estimated Creatinine Clearance: 16 mL/min (A) (by C-G formula based on SCr of 4.82 mg/dL (H)). Liver Function Tests: Recent Labs  Lab 07/09/18 2149 07/10/18 0034  07/14/18 0510 07/14/18 1841 07/15/18 0558 07/15/18 1600 07/16/18 0526  AST 15 19  --   --   --   --   --   --   ALT <5 7  --   --   --   --   --   --   ALKPHOS 65 62  --   --   --   --   --   --   BILITOT 1.0 0.9  --   --   --   --   --   --   PROT 7.9 7.2  --   --   --   --   --   --   ALBUMIN 3.6 3.4*   < > 2.7* 2.9* 2.8* 3.0* 2.7*   < > = values in this interval not displayed.   No results for input(s): LIPASE, AMYLASE in the last 168 hours. Recent Labs  Lab 07/10/18 0034 07/10/18 2236  AMMONIA 40* 38*   Coagulation Profile: No results for input(s): INR, PROTIME in the last 168 hours. Cardiac Enzymes: Recent Labs  Lab 07/10/18 2236 07/11/18 0341 07/11/18 1043  TROPONINI 0.07* 0.09* 0.07*   BNP (last 3 results) No results for input(s): PROBNP in the last 8760 hours. HbA1C: Recent Labs    07/14/18 0510  HGBA1C 4.1*   CBG: Recent Labs  Lab 07/15/18 2354 07/16/18 0415  07/16/18 0845 07/16/18 1214 07/16/18 1624  GLUCAP 109* 106* 96 108* 109*   Lipid Profile: Recent Labs    07/13/18 2205 07/14/18 0510  CHOL  --  98  HDL  --  35*  LDLCALC  --  49  TRIG 79 70  CHOLHDL  --  2.8   Thyroid Function Tests: No results for input(s): TSH, T4TOTAL, FREET4, T3FREE, THYROIDAB in the last 72 hours. Anemia Panel: No results for input(s): VITAMINB12, FOLATE, FERRITIN, TIBC, IRON, RETICCTPCT in the last 72 hours. Sepsis Labs: Recent Labs  Lab 07/10/18 0034 07/10/18 2236 07/11/18 0341 07/11/18 1043 07/12/18 0417  PROCALCITON  --  0.89 0.87  --  0.62  LATICACIDVEN 1.6 4.7* 1.9 1.3  --     Recent Results (from the past 240 hour(s))  Culture, blood (routine x 2)     Status: None   Collection Time: 07/10/18 10:36 PM  Result Value Ref Range Status   Specimen Description BLOOD RIGHT WRIST  Final   Special Requests   Final    BOTTLES DRAWN AEROBIC AND ANAEROBIC Blood Culture adequate volume   Culture   Final    NO GROWTH 5 DAYS Performed at [facility], [place] [address] [place], [place] [address]    Report Status 07/15/2018 FINAL  Final  Culture, blood (routine x 2)     Status: None   Collection Time: 07/10/18 10:36 PM  Result Value Ref Range Status   Specimen Description BLOOD RIGHT HAND  Final   Special Requests   Final    BOTTLES DRAWN AEROBIC AND ANAEROBIC Blood Culture adequate volume   Culture   Final    NO GROWTH 5 DAYS Performed at [facility], [place] [address] [place], [place] [address]    Report Status 07/15/2018 FINAL  Final  Culture, respiratory (non-expectorated)     Status: None   Collection Time: 07/11/18 12:20 AM  Result Value Ref Range Status   Specimen Description TRACHEAL ASPIRATE  Final   Special Requests NONE  Final   Gram Stain   Final    MODERATE WBC PRESENT, PREDOMINANTLY PMN FEW GRAM POSITIVE COCCI    Culture   Final    Consistent with normal respiratory flora. Performed at [facility], [place] [address], [place], [name] [address]    Report Status 07/13/2018 FINAL  Final     Radiology Studies: No results found.  Scheduled Meds: . amLODipine  10 mg Oral Daily  . aspirin EC  81 mg Oral Daily  . bictegravir-emtricitabine-tenofovir AF  1 tablet Oral Daily  . carvedilol  25 mg Oral BID WC  . Chlorhexidine Gluconate Cloth  6 each Topical Q0600  . Chlorhexidine Gluconate Cloth  6 each Topical Q0600  . Chlorhexidine Gluconate Cloth  6 each Topical Q0600  . cloNIDine  0.3 mg Oral TID  . darbepoetin (ARANESP) injection - DIALYSIS  60 mcg Intravenous Q Wed-HD  . famotidine  20 mg Per Tube Daily  . heparin injection (subcutaneous)  5,000 Units Subcutaneous Q8H  . hydrALAZINE  100 mg Oral TID  . insulin aspart  1-3 Units Subcutaneous Q4H  . isosorbide mononitrate  20 mg Oral BID  . ketorolac  1 drop Left Eye QID  . lacosamide  150 mg Oral BID  . multivitamin  1 tablet Oral Daily  . ofloxacin  1 drop Left Eye QID  . pantoprazole  40 mg Oral Daily  . prednisoLONE acetate  1 drop Left Eye QID  . sodium chloride flush  3 mL Intravenous Q12H  . sodium chloride flush  3 mL Intravenous Q12H   Continuous Infusions: . sodium chloride Stopped (07/12/18 2234)  . sodium chloride    . anticoagulant sodium citrate    .  feeding supplement (VITAL 1.5 CAL) Stopped (07/15/18 2055)  . ferric gluconate (FERRLECIT/NULECIT) IV Stopped (07/16/18 1118)  . niCARDipine Stopped (07/16/18 8675)     LOS: 6 days   Marylu Lund, MD [facility] Pager On Amion  If 7PM-7AM, please contact night-coverage 07/16/2018, 5:56 PM

## 2018-07-16 NOTE — Procedures (Signed)
Patient was seen on dialysis and the procedure was supervised.  BFR 400  Via AVF BP is  155/71.   Patient appears to be tolerating treatment well  Louis Meckel 07/16/2018

## 2018-07-16 NOTE — Progress Notes (Signed)
Subjective:  Confused, refusing meds, didn't sleep - HD yest removed only 2500 when were going for 4000- he terminated early and would like to do 4 days a week while he is here - BP remains high latest in the 170's which he says is normal for him - off IV BP med  Objective Vital signs in last 24 hours: Vitals:   07/16/18 0700 07/16/18 0800 07/16/18 0846 07/16/18 0918  BP: (!) 179/69 (!) 171/61  (!) 171/59  Pulse: 66 66    Resp: (!) 30 (!) 23    Temp:   98.5 F (36.9 C)   TempSrc:   Oral   SpO2: 97% 93%    Weight:      Height:       Weight change: 0.1 kg  Intake/Output Summary (Last 24 hours) at 07/16/2018 0934 Last data filed at 07/15/2018 2000 Gross per 24 hour  Intake 1698.59 ml  Output 2500 ml  Net -801.41 ml    Dialysis Orders:  Home HD - 4d/week, 3.5hr, EDW 76kg, AVF with buttonholes  Assessment/Plan: 1.  Seizure: Known seizure d/o - on Vimpat prior to admit, likely worsened by uremia. CT neg for acute finding. Neuro involved- EEG has not shown cont sz on meds- now delirium  2.  AMS: MRI did show small CVA, not felt to be too clinically signif.  Now uremia is better- more alert and extubated   On ASA- possibly some delirium from ICU 3.  ESRD: Usual home HD 4 days a week - last dialyzed 10/12 at home, BUN/Cr high on admit.  Tried to do IHD on 10/20- had sz- then moved to ICU for CRRT  from 10/20-10/22 then IHD at bedside 10/23 and 10/25.  He would prefer to do 4 days a week while here- not sure depending on HD schedule if we can do today - no acute needs - may need to start next week  4.  Hypertension/volume:  BP was very high, no peripheral edema  Highly variable- norvasc 10, coreg 25 BID, clonidine 0.3 TID, hydralazine 100 TID- still not controlled -  will challenge  with HD - he says his EDW is 21- is now 77 to 80-  pt says best BP he gets is 160 on 4 drug reg  5.  Anemia: Hgb 11.2, dropped to the 9/s, now 7's. added ESA- iron stores low, repleting -  platelets dropping as  well, held heparin and check HIT - was neg-  hgb and plt rebounded - can re introduce heparin with HD as needed  6.  Metabolic bone disease: Ca ok, Phos 2.1, binders not given, now held  7.  Hep B + 8.  HIV    Louis Meckel    Labs: Basic Metabolic Panel: Recent Labs  Lab 07/15/18 0558 07/15/18 1600 07/16/18 0526  NA 138  137 137 136  K 3.7  3.7 3.4* 3.8  CL 102  100 100 99  CO2 26  26 29 27   GLUCOSE 95  98 130* 102*  BUN 34*  34* 12 20  CREATININE 6.56*  6.41* 3.63* 4.82*  CALCIUM 8.5*  8.7* 8.5* 8.6*  PHOS 2.5 1.5* 2.1*   Liver Function Tests: Recent Labs  Lab 07/09/18 2149 07/10/18 0034  07/15/18 0558 07/15/18 1600 07/16/18 0526  AST 15 19  --   --   --   --   ALT <5 7  --   --   --   --   Arabella Merles  65 62  --   --   --   --   BILITOT 1.0 0.9  --   --   --   --   PROT 7.9 7.2  --   --   --   --   ALBUMIN 3.6 3.4*   < > 2.8* 3.0* 2.7*   < > = values in this interval not displayed.   No results for input(s): LIPASE, AMYLASE in the last 168 hours. Recent Labs  Lab 07/10/18 0034 07/10/18 2236  AMMONIA 40* 38*   CBC: Recent Labs  Lab 07/10/18 2049 07/11/18 0341 07/13/18 0500 07/14/18 0510 07/15/18 0558  WBC 11.7* 6.8 4.5 5.4 6.4  NEUTROABS  --   --  3.8  --  4.3  HGB 11.4* 9.6* 7.8* 8.5* 8.6*  HCT 36.4* 30.5* 26.1* 28.5* 29.3*  MCV 92.4 90.2 93.9 95.3 95.1  PLT 160 115* 79* 94* 145*   Cardiac Enzymes: Recent Labs  Lab 07/10/18 2236 07/11/18 0341 07/11/18 1043  TROPONINI 0.07* 0.09* 0.07*   CBG: Recent Labs  Lab 07/15/18 1523 07/15/18 1943 07/15/18 2354 07/16/18 0415 07/16/18 0845  GLUCAP 125* 128* 109* 106* 96    Iron Studies:  No results for input(s): IRON, TIBC, TRANSFERRIN, FERRITIN in the last 72 hours. Studies/Results: No results found. Medications: Infusions: . sodium chloride Stopped (07/12/18 2234)  . sodium chloride    . anticoagulant sodium citrate    . feeding supplement (VITAL 1.5 CAL) Stopped  (07/15/18 2055)  . ferric gluconate (FERRLECIT/NULECIT) IV Stopped (07/15/18 1505)  . niCARDipine 3 mg/hr (07/16/18 8676)    Scheduled Medications: . amLODipine  10 mg Oral Daily  . aspirin EC  81 mg Oral Daily  . bictegravir-emtricitabine-tenofovir AF  1 tablet Oral Daily  . carvedilol  25 mg Oral BID WC  . Chlorhexidine Gluconate Cloth  6 each Topical Q0600  . Chlorhexidine Gluconate Cloth  6 each Topical Q0600  . cloNIDine  0.3 mg Oral TID  . darbepoetin (ARANESP) injection - DIALYSIS  60 mcg Intravenous Q Wed-HD  . famotidine  20 mg Per Tube Daily  . heparin injection (subcutaneous)  5,000 Units Subcutaneous Q8H  . hydrALAZINE  100 mg Oral TID  . insulin aspart  1-3 Units Subcutaneous Q4H  . isosorbide mononitrate  20 mg Oral BID  . ketorolac  1 drop Left Eye QID  . lacosamide  150 mg Oral BID  . multivitamin  1 tablet Oral Daily  . ofloxacin  1 drop Left Eye QID  . pantoprazole  40 mg Oral Daily  . prednisoLONE acetate  1 drop Left Eye QID  . sodium chloride flush  3 mL Intravenous Q12H  . sodium chloride flush  3 mL Intravenous Q12H    have reviewed scheduled and prn medications.  Physical Exam: General: extubated, more alert this AM Heart: RRR Lungs: mostly clear-  Abdomen: soft, non tender Extremities: no edema Dialysis Access: has left AVF- patent- also right IJ temp cath placed 10/20- removed    07/16/2018,9:34 AM  LOS: 6 days

## 2018-07-16 NOTE — Progress Notes (Signed)
Patient is not compliant and refusing medications because he thinks the RN is "Not telling him the truth" He thinks the bed is a couch. He asked to go to the ED due to his high BP. RN tried to give patient IV BP medication but the IV was hurting him when RN tried to flush it. Patient insisted that he wants his medications in pill form and did not want RN to touch IV anymore. RN contacted night shift NP and explained the situation to him which then the NP ordered a one time dose of Labetalol to give the patient but patient still refused the pill due to RN not "telling him the truth."

## 2018-07-17 LAB — GLUCOSE, CAPILLARY
GLUCOSE-CAPILLARY: 102 mg/dL — AB (ref 70–99)
GLUCOSE-CAPILLARY: 131 mg/dL — AB (ref 70–99)
GLUCOSE-CAPILLARY: 98 mg/dL (ref 70–99)
GLUCOSE-CAPILLARY: 115 mg/dL — AB (ref 70–99)
Glucose-Capillary: 101 mg/dL — ABNORMAL HIGH (ref 70–99)
Glucose-Capillary: 103 mg/dL — ABNORMAL HIGH (ref 70–99)
Glucose-Capillary: 111 mg/dL — ABNORMAL HIGH (ref 70–99)
Glucose-Capillary: 121 mg/dL — ABNORMAL HIGH (ref 70–99)

## 2018-07-17 LAB — RENAL FUNCTION PANEL
ANION GAP: 12 (ref 5–15)
Albumin: 2.7 g/dL — ABNORMAL LOW (ref 3.5–5.0)
BUN: 16 mg/dL (ref 8–23)
CALCIUM: 8.7 mg/dL — AB (ref 8.9–10.3)
CO2: 28 mmol/L (ref 22–32)
Chloride: 97 mmol/L — ABNORMAL LOW (ref 98–111)
Creatinine, Ser: 4.27 mg/dL — ABNORMAL HIGH (ref 0.61–1.24)
GFR calc Af Amer: 16 mL/min — ABNORMAL LOW (ref >60)
GFR calc non Af Amer: 13 mL/min — ABNORMAL LOW (ref >60)
Glucose, Bld: 107 mg/dL — ABNORMAL HIGH (ref 70–99)
PHOSPHORUS: 2.5 mg/dL (ref 2.5–4.6)
Potassium: 3.8 mmol/L (ref 3.5–5.1)
SODIUM: 137 mmol/L (ref 135–145)

## 2018-07-17 LAB — MAGNESIUM: MAGNESIUM: 1.7 mg/dL (ref 1.7–2.4)

## 2018-07-17 MED ORDER — CHLORHEXIDINE GLUCONATE CLOTH 2 % EX PADS
6.0000 | MEDICATED_PAD | Freq: Every day | CUTANEOUS | Status: DC
  Administered 2018-07-17: 6 via TOPICAL

## 2018-07-17 MED ORDER — DOCUSATE SODIUM 100 MG PO CAPS
100.0000 mg | ORAL_CAPSULE | Freq: Two times a day (BID) | ORAL | Status: DC
  Administered 2018-07-17 – 2018-07-19 (×4): 100 mg via ORAL
  Filled 2018-07-17 (×4): qty 1

## 2018-07-17 MED ORDER — POLYETHYLENE GLYCOL 3350 17 G PO PACK
17.0000 g | PACK | Freq: Every day | ORAL | Status: DC
  Administered 2018-07-19: 17 g via ORAL
  Filled 2018-07-17 (×2): qty 1

## 2018-07-17 NOTE — Plan of Care (Signed)
  Problem: Education: Goal: Knowledge of General Education information will improve Description Including pain rating scale, medication(s)/side effects and non-pharmacologic comfort measures Outcome: Progressing   Problem: Activity: Goal: Risk for activity intolerance will decrease Outcome: Progressing   Problem: Nutrition: Goal: Adequate nutrition will be maintained Outcome: Progressing   Problem: Education: Goal: Expressions of having a comfortable level of knowledge regarding the disease process will increase Outcome: Progressing

## 2018-07-17 NOTE — Progress Notes (Signed)
  Speech Language Pathology Treatment: Dysphagia  Patient Details Name: Jeffrey [name] MRN: [id number] DOB: 08-May-1956 Today's Date: 07/17/2018 Time: 1345-1400 SLP Time Calculation (min) (ACUTE ONLY): 15 min  Assessment / Plan / Recommendation Clinical Impression  Patient seen to assure diet tolerance and airway protection. Patient given peanut butter crackers, mixed fruit cup and water. Patient was able to feed himself. He showed no s/s of aspiration. His voice was clear throughout session with no respiratory changes, throat clearing or coughing. His spontaneous cough was judged to be strong. Patient has met all swallowing goals and speech therapy will signoff at this time.    HPI HPI: Pt is a 62 y/o male admitted for encephalopathy presumably related to uremia. He had multiple seizures after admission and required intubation 10/20-10/24. MRI showed left frontal white matter small infarct (likely incidental finding per neurology). PMH: aortic valve severe regurgitation due to endocarditis, ESRD on HD, chronic anemia, HIV, hep B, hypertension, seizure, OSA, CAD, thrombocytopenia admitted for uremia, seizure and encephalopathy      SLP Plan   sign off       Recommendations  Diet recommendations: Regular;Thin liquid Liquids provided via: Cup;Straw Medication Administration: Whole meds with liquid Compensations: Small sips/bites                Oral Care Recommendations: Oral care BID Follow up Recommendations: None SLP Visit Diagnosis: Dysphagia, unspecified (R13.10)       [place], MA, CCC-SLP 07/17/2018 2:30 PM

## 2018-07-17 NOTE — Progress Notes (Signed)
Subjective:  HD again yesterday - removed 3 liters- BP was best it had been for several hours after HD- creeping up again this am- less confused    Objective Vital signs in last 24 hours: Vitals:   07/17/18 0300 07/17/18 0400 07/17/18 0500 07/17/18 0646  BP: (!) 174/145 (!) 164/141 (!) 178/143 (!) 160/80  Pulse: 62 67 65   Resp: (!) 26 (!) 26 (!) 29   Temp:      TempSrc:      SpO2: 99% 100% 97%   Weight:   76.8 kg   Height:       Weight change: -4.6 kg  Intake/Output Summary (Last 24 hours) at 07/17/2018 0843 Last data filed at 07/17/2018 0400 Gross per 24 hour  Intake 629.5 ml  Output 3000 ml  Net -2370.5 ml    Dialysis Orders:  Home HD - 4d/week, 3.5hr, EDW 76kg, AVF with buttonholes  Assessment/Plan: 1.  Seizure: Known seizure d/o - on Vimpat prior to admit, likely worsened by uremia. CT neg for acute finding. Neuro involved- remains on vimpat  2.  AMS: MRI did show small CVA, not felt to be too clinically signif.  Now uremia is better- more alert and extubated   On ASA-  3.  ESRD: Usual home HD 4 days a week - last dialyzed 10/12 at home, BUN/Cr high on admit.  Tried to do IHD on 10/20- had sz- then moved to ICU for CRRT  from 10/20-10/22 then IHD at bedside 10/23 and 10/25 and 10/26.  He would prefer to do 4 days a week at 3.5 hours  while here - will plan on MWFSat while here 4.  Hypertension/volume:  BP was very high, no peripheral edema  Highly variable- norvasc 10, coreg 25 BID, clonidine 0.3 TID, hydralazine 100 TID- better after challenge  with HD - he says his EDW is 76 but tries to get under- is now 77, will cont to challenge-  pt says best BP he gets is 160 on 4 drug reg  5.  Anemia: Hgb 11.2, dropped to the 9/s, then 7's. added ESA- iron stores low, repleting -  platelets dropping as well, held heparin and check HIT - was neg-  hgb and plt rebounded - can re introduce heparin with HD as needed  6.  Metabolic bone disease: Ca ok, Phos 2.5, binders not given, now held   7.  Hep B + 8.  HIV 9. Dispo- out of ICU- then possibly home as soon as tomorrow ??     Jeffrey [name]    Labs: Basic Metabolic Panel: Recent Labs  Lab 07/15/18 1600 07/16/18 0526 07/17/18 0547  NA 137 136 137  K 3.4* 3.8 3.8  CL 100 99 97*  CO2 29 27 28   GLUCOSE 130* 102* 107*  BUN 12 20 16   CREATININE 3.63* 4.82* 4.27*  CALCIUM 8.5* 8.6* 8.7*  PHOS 1.5* 2.1* 2.5   Liver Function Tests: Recent Labs  Lab 07/15/18 1600 07/16/18 0526 07/17/18 0547  ALBUMIN 3.0* 2.7* 2.7*   No results for input(s): LIPASE, AMYLASE in the last 168 hours. Recent Labs  Lab 07/10/18 2236  AMMONIA 38*   CBC: Recent Labs  Lab 07/10/18 2049 07/11/18 0341 07/13/18 0500 07/14/18 0510 07/15/18 0558  WBC 11.7* 6.8 4.5 5.4 6.4  NEUTROABS  --   --  3.8  --  4.3  HGB 11.4* 9.6* 7.8* 8.5* 8.6*  HCT 36.4* 30.5* 26.1* 28.5* 29.3*  MCV 92.4 90.2 93.9 95.3  95.1  PLT 160 115* 79* 94* 145*   Cardiac Enzymes: Recent Labs  Lab 07/10/18 2236 07/11/18 0341 07/11/18 1043  TROPONINI 0.07* 0.09* 0.07*   CBG: Recent Labs  Lab 07/16/18 1624 07/16/18 1934 07/17/18 0006 07/17/18 0336 07/17/18 0741  GLUCAP 109* 191* 101* 102* 103*    Iron Studies:  No results for input(s): IRON, TIBC, TRANSFERRIN, FERRITIN in the last 72 hours. Studies/Results: No results found. Medications: Infusions: . sodium chloride Stopped (07/12/18 2234)  . sodium chloride    . anticoagulant sodium citrate    . feeding supplement (VITAL 1.5 CAL) Stopped (07/15/18 2055)  . ferric gluconate (FERRLECIT/NULECIT) IV Stopped (07/16/18 1118)  . niCARDipine Stopped (07/16/18 8242)    Scheduled Medications: . amLODipine  10 mg Oral Daily  . aspirin EC  81 mg Oral Daily  . bictegravir-emtricitabine-tenofovir AF  1 tablet Oral Daily  . carvedilol  25 mg Oral BID WC  . Chlorhexidine Gluconate Cloth  6 each Topical Q0600  . Chlorhexidine Gluconate Cloth  6 each Topical Q0600  . Chlorhexidine Gluconate  Cloth  6 each Topical Q0600  . cloNIDine  0.3 mg Oral TID  . darbepoetin (ARANESP) injection - DIALYSIS  60 mcg Intravenous Q Wed-HD  . famotidine  20 mg Per Tube Daily  . heparin injection (subcutaneous)  5,000 Units Subcutaneous Q8H  . hydrALAZINE  100 mg Oral TID  . insulin aspart  1-3 Units Subcutaneous Q4H  . isosorbide mononitrate  20 mg Oral BID  . ketorolac  1 drop Left Eye QID  . lacosamide  150 mg Oral BID  . multivitamin  1 tablet Oral Daily  . ofloxacin  1 drop Left Eye QID  . pantoprazole  40 mg Oral Daily  . prednisoLONE acetate  1 drop Left Eye QID  . sodium chloride flush  3 mL Intravenous Q12H  . sodium chloride flush  3 mL Intravenous Q12H    have reviewed scheduled and prn medications.  Physical Exam: General:  more alert still  this AM Heart: RRR Lungs: mostly clear-  Abdomen: soft, non tender Extremities: no edema Dialysis Access: has left AVF- patent-  right IJ temp cath - removed    07/17/2018,8:43 AM  LOS: 7 days

## 2018-07-17 NOTE — Progress Notes (Signed)
Messaged attending MD re:"can we get something for BM? pt has not had a BM in over a week."

## 2018-07-17 NOTE — Progress Notes (Signed)
PROGRESS NOTE    Jeffrey [name]  UXN:[id number] DOB: [birth year] DOA: 07/09/2018 PCP: Velna Ochs, MD    Brief Narrative:  [phone] with hx of ESRD, HIV presenting with acute encephalopathy after not going to scheduled dialysis several days prior to admit. Patient developed seizures and required intubation. Pt since extubated and transferred to [facility] where he continues on HD per nephrology  Assessment & Plan:   Principal Problem:   Seizure ([place]) Active Problems:   End stage renal disease on home HD   Thrombocytopenia (H[facility])   Anemia of chronic disease   Altered mental status   Seizure disorder ([place])   Status epilepticus ([place])   Cerebral thrombosis with cerebral infarction  Status epilepticus: no further seizures -Patient is continued on Vimpat -Neurology had been following.  -Pt has remained stable  New small acute frontal lobe stroke, likely embolic with severe valve disease -Continue on ASA 81mg  -PT/Ot consulted, pending recs -Seen by Neurology  Acute encephalopathy -Likely secondary to uremia in additional above CVA -Conversant. Seems somewhat confused -Cont with HD as per below. Cont above meds for CVA  Severe AVR -Seen by CTS -Seems stable at present  New thrombocytopenia -HIT negative -Cont on sub q hep for DVT prophylaxis -continue ASA per above -Labs reviewed, improving  Acute respiratory failure with hypoxemia: resolved -SLP following -Continue with aspiration precautions  Severe hypertension post ischemic stroke, difficult to control -Currently on Cardene gtt, continue to wean off as tolerated -continue with low Na diet -continue amlodipine, hydralazine, coreg, clonidine -Recently increased Isosorbide -Continue PRN hydralazine to cover sbp>160  ESRD  -Continue with HD per Nephrology  GERD -Continue on PPI as tolerated  DVT prophylaxis: Heparin subQ Code Status: Full Family Communication: Pt in room, family not at  bedside Disposition Plan: Uncertain at this time  Consultants:   Nephrology  CCM  CTS  Neurology  Procedures:     Antimicrobials: Anti-infectives (From admission, onward)   Start     Dose/Rate Route Frequency Ordered Stop   07/10/18 1600  bictegravir-emtricitabine-tenofovir AF (BIKTARVY) 50-200-25 MG per tablet 1 tablet     1 tablet Oral Daily 07/10/18 1416     07/10/18 1000  bictegravir-emtricitabine-tenofovir AF (BIKTARVY) 50-200-25 MG per tablet 1 tablet  Status:  Discontinued     1 tablet Oral Daily 07/10/18 0011 07/10/18 0021      Subjective: Reports feeling better today  Objective: Vitals:   07/17/18 0500 07/17/18 0646 07/17/18 0720 07/17/18 1127  BP: (!) 178/143 (!) 160/80    Pulse: 65     Resp: (!) 29     Temp:   98.3 F (36.8 C) 98.5 F (36.9 C)  TempSrc:   Oral Oral  SpO2: 97%     Weight: 76.8 kg     Height:        Intake/Output Summary (Last 24 hours) at 07/17/2018 1525 Last data filed at 07/17/2018 1300 Gross per 24 hour  Intake 942 ml  Output 3000 ml  Net -2058 ml   Filed Weights   07/16/18 1315 07/16/18 1624 07/17/18 0500  Weight: 75.9 kg 72.9 kg 76.8 kg    Examination: General exam: Awake, laying in bed, in nad Respiratory system: Normal respiratory effort, no wheezing  Data Reviewed: I have personally reviewed following labs and imaging studies  CBC: Recent Labs  Lab 07/10/18 2049 07/11/18 0341 07/13/18 0500 07/14/18 0510 07/15/18 0558  WBC 11.7* 6.8 4.5 5.4 6.4  NEUTROABS  --   --  3.8  --  4.3  HGB 11.4* 9.6* 7.8* 8.5* 8.6*  HCT 36.4* 30.5* 26.1* 28.5* 29.3*  MCV 92.4 90.2 93.9 95.3 95.1  PLT 160 115* 79* 94* 656*   Basic Metabolic Panel: Recent Labs  Lab 07/12/18 0417 07/13/18 0500  07/14/18 0510 07/14/18 1841 07/15/18 0558 07/15/18 1600 07/16/18 0526 07/17/18 0547  NA 137 136   < > 137 138 138  137 137 136 137  K 4.3 4.9   < > 3.4* 3.8 3.7  3.7 3.4* 3.8 3.8  CL 104 102   < > 100 101 102  100 100 99 97*   CO2 24 25   < > 28 27 26  26 29 27 28   GLUCOSE 105* 144*   < > 135* 121* 95  98 130* 102* 107*  BUN 35* 54*   < > 27* 32* 34*  34* 12 20 16   CREATININE 6.70* 7.50*   < > 4.50* 5.66* 6.56*  6.41* 3.63* 4.82* 4.27*  CALCIUM 8.2* 8.6*   < > 8.7* 8.6* 8.5*  8.7* 8.5* 8.6* 8.7*  MG 2.2 2.2  --  2.0  --  1.9  --   --  1.7  PHOS 2.3* 2.8   < > 2.1* 2.5 2.5 1.5* 2.1* 2.5   < > = values in this interval not displayed.   GFR: Estimated Creatinine Clearance: 19 mL/min (A) (by C-G formula based on SCr of 4.27 mg/dL (H)). Liver Function Tests: Recent Labs  Lab 07/14/18 1841 07/15/18 0558 07/15/18 1600 07/16/18 0526 07/17/18 0547  ALBUMIN 2.9* 2.8* 3.0* 2.7* 2.7*   No results for input(s): LIPASE, AMYLASE in the last 168 hours. Recent Labs  Lab 07/10/18 2236  AMMONIA 38*   Coagulation Profile: No results for input(s): INR, PROTIME in the last 168 hours. Cardiac Enzymes: Recent Labs  Lab 07/10/18 2236 07/11/18 0341 07/11/18 1043  TROPONINI 0.07* 0.09* 0.07*   BNP (last 3 results) No results for input(s): PROBNP in the last 8760 hours. HbA1C: No results for input(s): HGBA1C in the last 72 hours. CBG: Recent Labs  Lab 07/17/18 0006 07/17/18 0336 07/17/18 0741 07/17/18 1126 07/17/18 1358  GLUCAP 101* 102* 103* 131* 98   Lipid Profile: Recent Labs    07/16/18 2144  TRIG 77   Thyroid Function Tests: No results for input(s): TSH, T4TOTAL, FREET4, T3FREE, THYROIDAB in the last 72 hours. Anemia Panel: No results for input(s): VITAMINB12, FOLATE, FERRITIN, TIBC, IRON, RETICCTPCT in the last 72 hours. Sepsis Labs: Recent Labs  Lab 07/10/18 2236 07/11/18 0341 07/11/18 1043 07/12/18 0417  PROCALCITON 0.89 0.87  --  0.62  LATICACIDVEN 4.7* 1.9 1.3  --     Recent Results (from the past 240 hour(s))  Culture, blood (routine x 2)     Status: None   Collection Time: 07/10/18 10:36 PM  Result Value Ref Range Status   Specimen Description BLOOD RIGHT WRIST  Final    Special Requests   Final    BOTTLES DRAWN AEROBIC AND ANAEROBIC Blood Culture adequate volume   Culture   Final    NO GROWTH 5 DAYS Performed at [facility], [place] [address] [place], [place] [address]    Report Status 07/15/2018 FINAL  Final  Culture, blood (routine x 2)     Status: None   Collection Time: 07/10/18 10:36 PM  Result Value Ref Range Status   Specimen Description BLOOD RIGHT HAND  Final   Special Requests   Final    BOTTLES DRAWN AEROBIC  AND ANAEROBIC Blood Culture adequate volume   Culture   Final    NO GROWTH 5 DAYS Performed at [facility], [place] [address] [place], [place] [address]    Report Status 07/15/2018 FINAL  Final  Culture, respiratory (non-expectorated)     Status: None   Collection Time: 07/11/18 12:20 AM  Result Value Ref Range Status   Specimen Description TRACHEAL ASPIRATE  Final   Special Requests NONE  Final   Gram Stain   Final    MODERATE WBC PRESENT, PREDOMINANTLY PMN FEW GRAM POSITIVE COCCI    Culture   Final    Consistent with normal respiratory flora. Performed at [facility], [place] [address] [place], [place] [address]    Report Status 07/13/2018 FINAL  Final     Radiology Studies: No results found.  Scheduled Meds: . amLODipine  10 mg Oral Daily  . aspirin EC  81 mg Oral Daily  . bictegravir-emtricitabine-tenofovir AF  1 tablet Oral Daily  . carvedilol  25 mg Oral BID WC  . Chlorhexidine Gluconate Cloth  6 each Topical Q0600  . Chlorhexidine Gluconate Cloth  6 each Topical Q0600  . Chlorhexidine Gluconate Cloth  6 each Topical Q0600  . Chlorhexidine Gluconate Cloth  6 each Topical Q0600  . cloNIDine  0.3 mg Oral TID  . darbepoetin (ARANESP) injection - DIALYSIS  60 mcg Intravenous Q Wed-HD  . docusate sodium  100 mg Oral BID  . famotidine  20 mg Per Tube Daily  . heparin injection (subcutaneous)  5,000 Units Subcutaneous Q8H  . hydrALAZINE  100 mg Oral TID  . insulin aspart  1-3 Units Subcutaneous Q4H  .  isosorbide mononitrate  20 mg Oral BID  . ketorolac  1 drop Left Eye QID  . lacosamide  150 mg Oral BID  . multivitamin  1 tablet Oral Daily  . ofloxacin  1 drop Left Eye QID  . pantoprazole  40 mg Oral Daily  . polyethylene glycol  17 g Oral Daily  . prednisoLONE acetate  1 drop Left Eye QID  . sodium chloride flush  3 mL Intravenous Q12H  . sodium chloride flush  3 mL Intravenous Q12H   Continuous Infusions: . sodium chloride Stopped (07/12/18 2234)  . sodium chloride    . anticoagulant sodium citrate    . feeding supplement (VITAL 1.5 CAL) Stopped (07/15/18 2055)  . niCARDipine Stopped (07/16/18 7209)     LOS: 7 days   Marylu Lund, MD [facility] Pager On Amion  If 7PM-7AM, please contact night-coverage 07/17/2018, 3:25 PM

## 2018-07-18 ENCOUNTER — Encounter (HOSPITAL_COMMUNITY): Payer: Medicare Other

## 2018-07-18 ENCOUNTER — Inpatient Hospital Stay (HOSPITAL_COMMUNITY): Admission: RE | Admit: 2018-07-18 | Payer: Medicare Other | Source: Ambulatory Visit

## 2018-07-18 ENCOUNTER — Other Ambulatory Visit (HOSPITAL_COMMUNITY): Payer: Medicare Other

## 2018-07-18 ENCOUNTER — Inpatient Hospital Stay (HOSPITAL_COMMUNITY): Payer: Medicare Other

## 2018-07-18 DIAGNOSIS — R4020 Unspecified coma: Secondary | ICD-10-CM

## 2018-07-18 DIAGNOSIS — I351 Nonrheumatic aortic (valve) insufficiency: Secondary | ICD-10-CM

## 2018-07-18 LAB — CBC
HEMATOCRIT: 29.1 % — AB (ref 39.0–52.0)
HEMOGLOBIN: 8.9 g/dL — AB (ref 13.0–17.0)
MCH: 29.3 pg (ref 26.0–34.0)
MCHC: 30.6 g/dL (ref 30.0–36.0)
MCV: 95.7 fL (ref 80.0–100.0)
NRBC: 0 % (ref 0.0–0.2)
Platelets: 185 10*3/uL (ref 150–400)
RBC: 3.04 MIL/uL — ABNORMAL LOW (ref 4.22–5.81)
RDW: 16.4 % — AB (ref 11.5–15.5)
WBC: 6.3 10*3/uL (ref 4.0–10.5)

## 2018-07-18 LAB — RENAL FUNCTION PANEL
Albumin: 2.7 g/dL — ABNORMAL LOW (ref 3.5–5.0)
Anion gap: 10 (ref 5–15)
BUN: 28 mg/dL — AB (ref 8–23)
CHLORIDE: 100 mmol/L (ref 98–111)
CO2: 27 mmol/L (ref 22–32)
CREATININE: 6.41 mg/dL — AB (ref 0.61–1.24)
Calcium: 8.6 mg/dL — ABNORMAL LOW (ref 8.9–10.3)
GFR calc Af Amer: 10 mL/min — ABNORMAL LOW (ref >60)
GFR calc non Af Amer: 8 mL/min — ABNORMAL LOW (ref >60)
Glucose, Bld: 96 mg/dL (ref 70–99)
POTASSIUM: 4.1 mmol/L (ref 3.5–5.1)
Phosphorus: 3.1 mg/dL (ref 2.5–4.6)
Sodium: 137 mmol/L (ref 135–145)

## 2018-07-18 LAB — GLUCOSE, CAPILLARY
GLUCOSE-CAPILLARY: 83 mg/dL (ref 70–99)
Glucose-Capillary: 102 mg/dL — ABNORMAL HIGH (ref 70–99)
Glucose-Capillary: 136 mg/dL — ABNORMAL HIGH (ref 70–99)
Glucose-Capillary: 135 mg/dL — ABNORMAL HIGH (ref 70–99)

## 2018-07-18 MED ORDER — NEPRO/CARBSTEADY PO LIQD
237.0000 mL | Freq: Two times a day (BID) | ORAL | Status: DC
  Administered 2018-07-19: 237 mL via ORAL
  Filled 2018-07-18 (×2): qty 237

## 2018-07-18 MED ORDER — OCUVITE-LUTEIN PO CAPS
1.0000 | ORAL_CAPSULE | Freq: Every day | ORAL | Status: DC
  Filled 2018-07-18 (×4): qty 1

## 2018-07-18 MED ORDER — NEPRO/CARBSTEADY PO LIQD
237.0000 mL | Freq: Two times a day (BID) | ORAL | Status: DC
  Administered 2018-07-18: 237 mL via ORAL
  Filled 2018-07-18: qty 237

## 2018-07-18 NOTE — Progress Notes (Signed)
PT Cancellation Note  Patient Details Name: CLABORN JANUSZ MRN: [id number] DOB: 08/14/1956   Cancelled Treatment:    Reason Eval/Treat Not Completed: Patient at procedure or test/unavailable.  Pt is in HD.  PT will check back later today as time allows.  Thanks,    Barbarann Ehlers. Jorryn Hershberger, PT, DPT  Acute Rehabilitation [phone] pager #[phone] office   07/18/2018, 8:24 AM

## 2018-07-18 NOTE — Progress Notes (Addendum)
PROGRESS NOTE    Jeffrey [name]  KDX:[id number] DOB: 07-Oct-1955 DOA: 07/09/2018 PCP: Velna Ochs, MD    Brief Narrative:  [phone] with hx of ESRD, HIV presenting with acute encephalopathy after not going to scheduled dialysis several days prior to admit. Patient developed seizures and required intubation. Pt since extubated and transferred to [facility] where he continues on HD per nephrology  Assessment & Plan:   Principal Problem:   Seizure ([place]) Active Problems:   End stage renal disease on home HD   Thrombocytopenia (H[facility])   Anemia of chronic disease   Altered mental status   Seizure disorder ([place])   Status epilepticus ([place])   Cerebral thrombosis with cerebral infarction  Status epilepticus: no further seizures -Patient is continued on Vimpat -Neurology had been following.  -Pt has remained stable -Still awaiting PT/OT eval for placement. Pt admits to being weaker than normal  New small acute frontal lobe stroke, likely embolic with severe valve disease -Continue on ASA 81mg  -PT/Ot consulted, pending recs -Seen by Neurology  Acute encephalopathy -Likely secondary to uremia in additional above CVA -Conversant. Seems somewhat confused -Cont with HD as per below. Cont above meds for CVA  Severe AVR -Seen by CTS -Seems stable at present  New thrombocytopenia -HIT negative -Cont on sub q hep for DVT prophylaxis -continue ASA per above -Labs reviewed, improving  Acute respiratory failure with hypoxemia: resolved -SLP following -Continue with aspiration precautions  Severe hypertension post ischemic stroke, difficult to control -Currently on Cardene gtt, continue to wean off as tolerated -continue with low Na diet -continue amlodipine, hydralazine, coreg, clonidine -Recently increased Isosorbide -Continue PRN hydralazine to cover sbp>160  ESRD  -Continue with HD per Nephrology  GERD -Continue on PPI as tolerated  Moderate protein calorie  malnutrition  DVT prophylaxis: Heparin subQ Code Status: Full Family Communication: Pt in room, family not at bedside Disposition Plan: Uncertain at this time  Consultants:   Nephrology  CCM  CTS  Neurology  Procedures:     Antimicrobials: Anti-infectives (From admission, onward)   Start     Dose/Rate Route Frequency Ordered Stop   07/10/18 1600  bictegravir-emtricitabine-tenofovir AF (BIKTARVY) 50-200-25 MG per tablet 1 tablet     1 tablet Oral Daily 07/10/18 1416     07/10/18 1000  bictegravir-emtricitabine-tenofovir AF (BIKTARVY) 50-200-25 MG per tablet 1 tablet  Status:  Discontinued     1 tablet Oral Daily 07/10/18 0011 07/10/18 0021      Subjective: Eager to go home  Objective: Vitals:   07/18/18 1050 07/18/18 1156 07/18/18 1215 07/18/18 1300  BP: (!) 143/72  (!) 198/74   Pulse: 61   64  Resp: (!) 23   (!) 25  Temp: 98.7 F (37.1 C) 99 F (37.2 C)    TempSrc: Oral Oral    SpO2: 95%   100%  Weight: 72.8 kg     Height:        Intake/Output Summary (Last 24 hours) at 07/18/2018 1500 Last data filed at 07/18/2018 1050 Gross per 24 hour  Intake 222 ml  Output 3000 ml  Net -2778 ml   Filed Weights   07/18/18 0249 07/18/18 0705 07/18/18 1050  Weight: 76.5 kg 75.8 kg 72.8 kg    Examination: General exam: Conversant, in no acute distress Respiratory system: normal chest rise, clear, no audible wheezing  Data Reviewed: I have personally reviewed following labs and imaging studies  CBC: Recent Labs  Lab 07/13/18 0500 07/14/18 0510 07/15/18 0558 07/18/18 3419  WBC 4.5 5.4 6.4 6.3  NEUTROABS 3.8  --  4.3  --   HGB 7.8* 8.5* 8.6* 8.9*  HCT 26.1* 28.5* 29.3* 29.1*  MCV 93.9 95.3 95.1 95.7  PLT 79* 94* 145* 741   Basic Metabolic Panel: Recent Labs  Lab 07/12/18 0417 07/13/18 0500  07/14/18 0510  07/15/18 0558 07/15/18 1600 07/16/18 0526 07/17/18 0547 07/18/18 0859  NA 137 136   < > 137   < > 138  137 137 136 137 137  K 4.3 4.9   < >  3.4*   < > 3.7  3.7 3.4* 3.8 3.8 4.1  CL 104 102   < > 100   < > 102  100 100 99 97* 100  CO2 24 25   < > 28   < > 26  26 29 27 28 27   GLUCOSE 105* 144*   < > 135*   < > 95  98 130* 102* 107* 96  BUN 35* 54*   < > 27*   < > 34*  34* 12 20 16  28*  CREATININE 6.70* 7.50*   < > 4.50*   < > 6.56*  6.41* 3.63* 4.82* 4.27* 6.41*  CALCIUM 8.2* 8.6*   < > 8.7*   < > 8.5*  8.7* 8.5* 8.6* 8.7* 8.6*  MG 2.2 2.2  --  2.0  --  1.9  --   --  1.7  --   PHOS 2.3* 2.8   < > 2.1*   < > 2.5 1.5* 2.1* 2.5 3.1   < > = values in this interval not displayed.   GFR: Estimated Creatinine Clearance: 12 mL/min (A) (by C-G formula based on SCr of 6.41 mg/dL (H)). Liver Function Tests: Recent Labs  Lab 07/15/18 0558 07/15/18 1600 07/16/18 0526 07/17/18 0547 07/18/18 0859  ALBUMIN 2.8* 3.0* 2.7* 2.7* 2.7*   No results for input(s): LIPASE, AMYLASE in the last 168 hours. No results for input(s): AMMONIA in the last 168 hours. Coagulation Profile: No results for input(s): INR, PROTIME in the last 168 hours. Cardiac Enzymes: No results for input(s): CKTOTAL, CKMB, CKMBINDEX, TROPONINI in the last 168 hours. BNP (last 3 results) No results for input(s): PROBNP in the last 8760 hours. HbA1C: No results for input(s): HGBA1C in the last 72 hours. CBG: Recent Labs  Lab 07/17/18 1912 07/17/18 2334 07/18/18 0335 07/18/18 1136 07/18/18 1454  GLUCAP 111* 121* 83 102* 136*   Lipid Profile: Recent Labs    07/16/18 2144  TRIG 77   Thyroid Function Tests: No results for input(s): TSH, T4TOTAL, FREET4, T3FREE, THYROIDAB in the last 72 hours. Anemia Panel: No results for input(s): VITAMINB12, FOLATE, FERRITIN, TIBC, IRON, RETICCTPCT in the last 72 hours. Sepsis Labs: Recent Labs  Lab 07/12/18 0417  PROCALCITON 0.62    Recent Results (from the past 240 hour(s))  Culture, blood (routine x 2)     Status: None   Collection Time: 07/10/18 10:36 PM  Result Value Ref Range Status   Specimen  Description BLOOD RIGHT WRIST  Final   Special Requests   Final    BOTTLES DRAWN AEROBIC AND ANAEROBIC Blood Culture adequate volume   Culture   Final    NO GROWTH 5 DAYS Performed at [facility], [address] [place], [place] [address]    Report Status 07/15/2018 FINAL  Final  Culture, blood (routine x 2)     Status: None   Collection Time: 07/10/18 10:36 PM  Result  Value Ref Range Status   Specimen Description BLOOD RIGHT HAND  Final   Special Requests   Final    BOTTLES DRAWN AEROBIC AND ANAEROBIC Blood Culture adequate volume   Culture   Final    NO GROWTH 5 DAYS Performed at [facility], [address], [place], [place] [address]    Report Status 07/15/2018 FINAL  Final  Culture, respiratory (non-expectorated)     Status: None   Collection Time: 07/11/18 12:20 AM  Result Value Ref Range Status   Specimen Description TRACHEAL ASPIRATE  Final   Special Requests NONE  Final   Gram Stain   Final    MODERATE WBC PRESENT, PREDOMINANTLY PMN FEW GRAM POSITIVE COCCI    Culture   Final    Consistent with normal respiratory flora. Performed at [facility], [place] [address], [place], [place] [address]    Report Status 07/13/2018 FINAL  Final     Radiology Studies: No results found.  Scheduled Meds: . amLODipine  10 mg Oral Daily  . aspirin EC  81 mg Oral Daily  . bictegravir-emtricitabine-tenofovir AF  1 tablet Oral Daily  . carvedilol  25 mg Oral BID WC  . Chlorhexidine Gluconate Cloth  6 each Topical Q0600  . cloNIDine  0.3 mg Oral TID  . darbepoetin (ARANESP) injection - DIALYSIS  60 mcg Intravenous Q Wed-HD  . docusate sodium  100 mg Oral BID  . famotidine  20 mg Per Tube Daily  . feeding supplement (NEPRO CARB STEADY)  237 mL Oral BID BM  . heparin injection (subcutaneous)  5,000 Units Subcutaneous Q8H  . hydrALAZINE  100 mg Oral TID  . insulin aspart  1-3 Units Subcutaneous Q4H  . isosorbide mononitrate  20 mg Oral BID  . ketorolac  1 drop  Left Eye QID  . lacosamide  150 mg Oral BID  . multivitamin  1 tablet Oral Daily  . multivitamin-lutein  1 capsule Oral Daily  . ofloxacin  1 drop Left Eye QID  . pantoprazole  40 mg Oral Daily  . polyethylene glycol  17 g Oral Daily  . prednisoLONE acetate  1 drop Left Eye QID  . sodium chloride flush  3 mL Intravenous Q12H  . sodium chloride flush  3 mL Intravenous Q12H   Continuous Infusions: . sodium chloride Stopped (07/12/18 2234)  . sodium chloride    . anticoagulant sodium citrate    . feeding supplement (VITAL 1.5 CAL) Stopped (07/15/18 2055)     LOS: 8 days   Marylu Lund, MD [facility] Pager On Amion  If 7PM-7AM, please contact night-coverage 07/18/2018, 3:00 PM

## 2018-07-18 NOTE — Progress Notes (Signed)
Nutrition Follow-up  DOCUMENTATION CODES:   Non-severe (moderate) malnutrition in context of chronic illness  INTERVENTION:   Nepro Shake po BID, each supplement provides 425 kcal and 19 grams protein  Rena-vite daily  Ocuvite daily for wound healing (provides zinc, vitamin A, vitamin C, Vitamin E, copper, and selenium)  Recommend liberal diet   Bowel regimen per MD  NUTRITION DIAGNOSIS:   Moderate Malnutrition related to chronic illness(HIV, ESRD on HD) as evidenced by moderate fat depletion, moderate muscle depletion.  GOAL:   Patient will meet greater than or equal to 90% of their needs  Met with tube feeds  MONITOR:   PO intake, Supplement acceptance, Labs, Weight trends, Skin, I & O's  ASSESSMENT:   62 year old male who presented to the ED on 10/19 with seizures and having missed dialysis since 10/12. PMH significant for HIV, hepatitis B, ESRD on home dialysis 4 times per week, aortic valve stenosis, anemia, hypertension, hyperthyroidism, and seizure disorder. Pt was recently discharge from the hospital after being admitted for AV fistula revision.   Pt extubated 10/24; seen by SLP on 10/15 and approved for regular/thin liquid diet. Pt doing well; currently eating 75-100% of meals. RD will add supplements and Ocuvite to help pt meet his estimated needs and encourage wound healing. Would also recommend liberal diet as a heart health diet is very restrictive and can limit protein intake. Per chart, pt is fairly weight stable since admit. Patient has not had a BM since admit; bowel regimen initiated yesterday.   Medications reviewed and include: aspirin, darbopoetin, colace, pepcid, heparin, insulin, rena-vite, protonix, miralax  Labs reviewed: K 4.1 wnl, BUN 28(H), creat 6.41(H) Hgb 8.9(L), Hct 29.1(L) CD4- 220(L) Iron 20(L), ferritin 490(H)- 10/23  Diet Order:   Diet Order            Diet Heart Room service appropriate? Yes; Fluid consistency: Thin; Fluid  restriction: 1200 mL Fluid  Diet effective now             EDUCATION NEEDS:   Not appropriate for education at this time  Skin:  Skin Assessment: Reviewed RN Assessment(Stage II buttocks, incision L arm)  Last BM:  unknown/PTA  Height:   Ht Readings from Last 1 Encounters:  07/09/18 6' (1.829 m)    Weight:   Wt Readings from Last 1 Encounters:  07/18/18 72.8 kg    Ideal Body Weight:  80.91 kg  BMI:  Body mass index is 21.77 kg/m.  Estimated Nutritional Needs:   Kcal:  2100-2400kcal/day   Protein:  109-124g/day   Fluid:  UOP + 1L  Koleen Distance MS, RD, LDN Pager #- [phone] Office#- [phone] After Hours Pager: [phone]'

## 2018-07-18 NOTE — Progress Notes (Signed)
OT Cancellation Note  Patient Details Name: Jeffrey [name] MRN: [id number] DOB: Aug 18, 1956   Cancelled Treatment:    Reason Eval/Treat Not Completed: Patient at procedure or test/ unavailable.  Pt  In HD.  Will reattempt.  Lucille Passy, OTR/L Acute Rehabilitation Services Pager [phone] Office [phone]   Lucille Passy M 07/18/2018, 9:55 AM

## 2018-07-18 NOTE — Progress Notes (Signed)
Patient is being difficult with RN due to RN being concerned about patient's blood pressure. Patient is very upset that RN keeps waking up patient to try and convince patient that it is important to treat his extremely high blood pressure. Charge nurse had to step in to help make the patient a bit more compliant and explain to him about the risk of not treating the high BP. Patient finally agreed for RN to administer IV PRN blood pressure medication.

## 2018-07-18 NOTE — Progress Notes (Signed)
[facility] Progress Note    Assessment/ Plan:   62yo male with ESRD (on home HD), Hep B, HTN, HIV, Hx seizure disorder (over past 5 mo only), and asthma who was admitted with AMS and seizures.  Revision of lt cimino 10/14 by Dr. Scot Dock and stayed afterwards for HTN urgency and AMS. On readmission he had a TC Sz witnessed by the brother.  1. Seizure: Known seizure d/o- on Vimpat prior to admit, likely worsened by uremia. CT neg for acute finding. Neuro involved- remains on vimpat  2. AMS: MRI did show small CVA, not felt to be too clinically signif.  Now uremia is better- more alert and extubated   On ASA-  3. ESRD: Usual home HD 4 days a week - last dialyzed 10/12 at home, BUN/Cr high on admit.  Tried to do IHD on 10/20- had sz- then moved to ICU for CRRT  from 10/20-10/22 then IHD at bedside 10/23 and 10/25 and 10/26.  He would prefer to do 4 days a week at 3.5 hours  while here - will plan on MWFSat while here.  - Tolerated tx today and asking to go home. - From renal standpoint he is stable for d/c. He understands the need to dialyze consistently and not miss treatments + challenging his EDW.  4. Hypertension/volume: BP was very high, no peripheral edema Highly variable- norvasc 10, coreg 25 BID, clonidine 0.3 TID, hydralazine 100 TID- better after challenge  with HD - he says his EDW is 76 but tries to get under- is now 77, will cont to challenge-  pt says best BP he gets is 160 on 4 drug reg  5. Anemia: Hgb 11.2, dropped to the 9/s, then 7's. added ESA- iron stores low, repleting -  platelets dropping as well, held heparin and check HIT - was neg-  hgb and plt rebounded - can re introduce heparin with HD as needed  6. Metabolic bone disease: Ca ok, Phos 2.5, binders not given, now held  7. Hep B + 8. HIV 9. Dispo- out of ICU- then possibly home soon?   Subjective:   Doing well.  Seen on HD @820AM  165/81 UF 3.5L (3L net) 3K/2.5Ca  Lt Cimino with great  bruit.  Denies f/c/n/v/dyspnea/HA   Objective:   BP (!) 160/83   Pulse 61   Temp 98.4 F (36.9 C) (Oral)   Resp (!) 24   Ht 6' (1.829 m)   Wt 75.8 kg   SpO2 99%   BMI 22.66 kg/m   Intake/Output Summary (Last 24 hours) at 07/18/2018 1751 Last data filed at 07/17/2018 2000 Gross per 24 hour  Intake 784 ml  Output -  Net 784 ml   Weight change: 0.6 kg  Physical Exam: General:   alert and cooperaetive Heart: RRR Lungs:  clear  Abdomen: soft, non tender Extremities: no edema Dialysis Access: has left AVF- patent w/ great bruit  Imaging: No results found.  Labs: BMET Recent Labs  Lab 07/13/18 2205 07/14/18 0510 07/14/18 1841 07/15/18 0558 07/15/18 1600 07/16/18 0526 07/17/18 0547  NA 136 137 138 138  137 137 136 137  K 3.2* 3.4* 3.8 3.7  3.7 3.4* 3.8 3.8  CL 100 100 101 102  100 100 99 97*  CO2 28 28 27 26  26 29 27 28   GLUCOSE 126* 135* 121* 95  98 130* 102* 107*  BUN 21 27* 32* 34*  34* 12 20 16   CREATININE 3.81* 4.50* 5.66* 6.56*  6.41* 3.63* 4.82* 4.27*  CALCIUM 8.6* 8.7* 8.6* 8.5*  8.7* 8.5* 8.6* 8.7*  PHOS 2.0* 2.1* 2.5 2.5 1.5* 2.1* 2.5   CBC Recent Labs  Lab 07/13/18 0500 07/14/18 0510 07/15/18 0558  WBC 4.5 5.4 6.4  NEUTROABS 3.8  --  4.3  HGB 7.8* 8.5* 8.6*  HCT 26.1* 28.5* 29.3*  MCV 93.9 95.3 95.1  PLT 79* 94* 145*    Medications:    . amLODipine  10 mg Oral Daily  . aspirin EC  81 mg Oral Daily  . bictegravir-emtricitabine-tenofovir AF  1 tablet Oral Daily  . carvedilol  25 mg Oral BID WC  . Chlorhexidine Gluconate Cloth  6 each Topical Q0600  . cloNIDine  0.3 mg Oral TID  . darbepoetin (ARANESP) injection - DIALYSIS  60 mcg Intravenous Q Wed-HD  . docusate sodium  100 mg Oral BID  . famotidine  20 mg Per Tube Daily  . heparin injection (subcutaneous)  5,000 Units Subcutaneous Q8H  . hydrALAZINE  100 mg Oral TID  . insulin aspart  1-3 Units Subcutaneous Q4H  . isosorbide mononitrate  20 mg Oral BID  . ketorolac  1  drop Left Eye QID  . lacosamide  150 mg Oral BID  . multivitamin  1 tablet Oral Daily  . ofloxacin  1 drop Left Eye QID  . pantoprazole  40 mg Oral Daily  . polyethylene glycol  17 g Oral Daily  . prednisoLONE acetate  1 drop Left Eye QID  . sodium chloride flush  3 mL Intravenous Q12H  . sodium chloride flush  3 mL Intravenous Q12H      Otelia Santee, MD 07/18/2018, 8:52 AM

## 2018-07-18 NOTE — Evaluation (Addendum)
Physical Therapy Evaluation Patient Details Name: Jeffrey [name] MRN: [id number] DOB: 06-09-1956 Today's Date: 07/18/2018   History of Present Illness  62 y.o. male admitted on 07/09/18 for seizure with AMS/increased somnolence and missed HD, CXR showed plural effusion, and CT was negative.  Pt also with anemia/thrombocytopenia, hypertensive urgency.  Pt with seizure during HD on 07/10/18 and code blue called due to loss of pulse.  Pt was intubated and transferred to ICU (intubated 10/20-10/24/19).  Pt with other significant PMH of HIV, Hep B, ESRD on HD (at home), aortic valve stenosis, I and D of L knee, multiple AVF placements/revisions.  Clinical Impression  Pt was able to ambulate around the unit with RW and supervision.  He was unable to walk safely without an assistive device and is agreeable to using his RW at home initially.  He was active with [facility]PT PTA and would like to and is appropriate to resume these home services.   PT to follow acutely for deficits listed below.      Follow Up Recommendations Home health PT;Supervision for mobility/OOB(resume HHPT with [facility])    Equipment Recommendations  None recommended by PT    Recommendations for Other Services   NA     Precautions / Restrictions Precautions Precautions: Fall Precaution Comments: mildly unsteady on his feet.      Mobility  Bed Mobility Overal bed mobility: Needs Assistance Bed Mobility: Supine to Sit     Supine to sit: Supervision;HOB elevated     General bed mobility comments: supervision for safety, extra time and effort needed.   Transfers Overall transfer level: Needs assistance Equipment used: Rolling walker (2 wheeled);None Transfers: Sit to/from Stand Sit to Stand: Min guard         General transfer comment: Min guard assist, with posterior LOB without RW, RW used and still min guard assist to stand and stabilize.   Ambulation/Gait Ambulation/Gait assistance:  Supervision Gait Distance (Feet): 130 Feet Assistive device: Rolling walker (2 wheeled) Gait Pattern/deviations: Step-through pattern;Staggering left Gait velocity: decreased Gait velocity interpretation: 1.31 - 2.62 ft/sec, indicative of limited community ambulator General Gait Details: Pt with mildly staggering gait pattern, close supervision for safety, pt telling PT to  "let go of me!"  Pt agreeable that he needs to use his RW for a short time when he first gets home.          Balance Overall balance assessment: Needs assistance Sitting-balance support: Feet supported;No upper extremity supported Sitting balance-Leahy Scale: Good     Standing balance support: Bilateral upper extremity supported Standing balance-Leahy Scale: Poor                               Pertinent Vitals/Pain Pain Assessment: No/denies pain    Home Living Family/patient expects to be discharged to:: Private residence Living Arrangements: Other relatives(brother) Available Help at Discharge: Family;Available PRN/intermittently Type of Home: House(townhome) Home Access: Stairs to enter Entrance Stairs-Rails: Right Entrance Stairs-Number of Steps: 1-2 Home Layout: Two level;Able to live on main level with bedroom/bathroom Home Equipment: Gilford Rile - 2 wheels;Walker - 4 wheels;Cane - quad Additional Comments: Home Hemodialisis    Prior Function Level of Independence: Independent with assistive device(s)         Comments: pt report he uses cane/RW depending on how he feels, his brother is with him when he goes out of the house for appointments and his brother drives.  Brother  works night shift and helps him PRN during the day.         Extremity/Trunk Assessment   Upper Extremity Assessment Upper Extremity Assessment: Generalized weakness    Lower Extremity Assessment Lower Extremity Assessment: Generalized weakness    Cervical / Trunk Assessment Cervical / Trunk Assessment:  Normal  Communication   Communication: No difficulties;Other (comment)(does not appear to have any teeth. )  Cognition Arousal/Alertness: Awake/alert Behavior During Therapy: WFL for tasks assessed/performed Overall Cognitive Status: Difficult to assess                                 General Comments: Pt takes offense to orientation questions, "Do you think I am stupid?" " I have a degree.  I am a retired Systems developer comments (skin integrity, edema, etc.): VSS throughout session, Pt's BPs in the 644I systolically.     Exercises Other Exercises Other Exercises: Pt demonstrated LAQs, Heel and toe raises, and seated marches reporting he does those at home with the home PT.    Assessment/Plan    PT Assessment Patient needs continued PT services  PT Problem List Decreased strength;Decreased activity tolerance;Decreased balance;Decreased mobility;Decreased knowledge of use of DME       PT Treatment Interventions DME instruction;Gait training;Stair training;Functional mobility training;Therapeutic activities;Therapeutic exercise;Balance training;Patient/family education    PT Goals (Current goals can be found in the Care Plan section)  Acute Rehab PT Goals Patient Stated Goal: to get out of here and go home ASAP PT Goal Formulation: With patient Time For Goal Achievement: 08/01/18 Potential to Achieve Goals: Good    Frequency Min 3X/week           AM-PAC PT "6 Clicks" Daily Activity  Outcome Measure Difficulty turning over in bed (including adjusting bedclothes, sheets and blankets)?: A Little Difficulty moving from lying on back to sitting on the side of the bed? : A Little Difficulty sitting down on and standing up from a chair with arms (e.g., wheelchair, bedside commode, etc,.)?: Unable Help needed moving to and from a bed to chair (including a wheelchair)?: A Little Help needed walking in hospital room?: A Little Help needed  climbing 3-5 steps with a railing? : A Little 6 Click Score: 16    End of Session Equipment Utilized During Treatment: Gait belt Activity Tolerance: Patient tolerated treatment well Patient left: in bed;with call bell/phone within reach;with bed alarm set Nurse Communication: Mobility status PT Visit Diagnosis: Muscle weakness (generalized) (M62.81);Difficulty in walking, not elsewhere classified (R26.2)    Time: 3474-2595 PT Time Calculation (min) (ACUTE ONLY): 34 min   Charges:          Wells Guiles B. Lonnie Reth, PT, DPT  Acute Rehabilitation [phone] pager #[phone] office   PT Evaluation $PT Eval Moderate Complexity: 1 Mod PT Treatments $Gait Training: 8-22 mins $Therapeutic Activity: 8-22 mins        07/18/2018, 4:16 PM

## 2018-07-19 ENCOUNTER — Other Ambulatory Visit: Payer: Self-pay | Admitting: *Deleted

## 2018-07-19 DIAGNOSIS — I351 Nonrheumatic aortic (valve) insufficiency: Secondary | ICD-10-CM

## 2018-07-19 DIAGNOSIS — I350 Nonrheumatic aortic (valve) stenosis: Secondary | ICD-10-CM

## 2018-07-19 LAB — GLUCOSE, CAPILLARY
GLUCOSE-CAPILLARY: 109 mg/dL — AB (ref 70–99)
Glucose-Capillary: 109 mg/dL — ABNORMAL HIGH (ref 70–99)
Glucose-Capillary: 120 mg/dL — ABNORMAL HIGH (ref 70–99)

## 2018-07-19 MED ORDER — PROSIGHT PO TABS: 1.0000 | ORAL_TABLET | Freq: Every day | ORAL | Status: DC

## 2018-07-19 MED ORDER — ISOSORBIDE MONONITRATE 20 MG PO TABS: 20.0000 mg | ORAL_TABLET | Freq: Two times a day (BID) | ORAL | 0 refills | Status: DC

## 2018-07-19 MED ORDER — ASPIRIN 81 MG PO TBEC: 81.0000 mg | DELAYED_RELEASE_TABLET | Freq: Every day | ORAL | 0 refills | Status: DC

## 2018-07-19 MED ORDER — CLONIDINE HCL 0.3 MG PO TABS: 0.3000 mg | ORAL_TABLET | Freq: Three times a day (TID) | ORAL | 0 refills | Status: DC

## 2018-07-19 MED ORDER — AMLODIPINE BESYLATE 10 MG PO TABS: 10.0000 mg | ORAL_TABLET | Freq: Every day | ORAL | 0 refills | Status: DC

## 2018-07-19 MED ORDER — LACOSAMIDE 150 MG PO TABS: 150.0000 mg | ORAL_TABLET | Freq: Two times a day (BID) | ORAL | 0 refills | Status: DC

## 2018-07-19 NOTE — Evaluation (Signed)
Occupational Therapy Evaluation Patient Details Name: Jeffrey [name] MRN: [id number] DOB: Jan 16, 1956 Today's Date: 07/19/2018    History of Present Illness 62 y.o. male admitted on 07/09/18 for seizure with AMS/increased somnolence and missed HD, CXR showed plural effusion, and CT was negative.  Pt also with anemia/thrombocytopenia, hypertensive urgency.  Pt with seizure during HD on 07/10/18 and code blue called due to loss of pulse.  Pt was intubated and transferred to ICU (intubated 10/20-10/24/19).  Pt with other significant PMH of HIV, Hep B, ESRD on HD (at home), aortic valve stenosis, I and D of L knee, multiple AVF placements/revisions.   Clinical Impression   PT admitted with seizure and HTN urgency. Pt currently with functional limitiations due to the deficits listed below (see OT problem list). Pt demonstrates balance deficits at min guard (A) for transfers and LB adls. Pt will have brother (A) upon d/c home. Recommend brother walk the dog a this time.  Pt will benefit from skilled OT to increase their independence and safety with adls and balance to allow discharge home.     Follow Up Recommendations  No OT follow up    Equipment Recommendations  None recommended by OT    Recommendations for Other Services       Precautions / Restrictions Precautions Precautions: Fall Precaution Comments: mildly unsteady on his feet. Restrictions Weight Bearing Restrictions: No      Mobility Bed Mobility Overal bed mobility: Needs Assistance Bed Mobility: Supine to Sit     Supine to sit: HOB elevated;Mod assist     General bed mobility comments: pt reaching with R UE for therapist and saying "here let me pull up "  Transfers Overall transfer level: Needs assistance Equipment used: Rolling walker (2 wheeled);None Transfers: Sit to/from Stand Sit to Stand: Min guard              Balance Overall balance assessment: Needs assistance Sitting-balance support: Feet  supported;No upper extremity supported Sitting balance-Leahy Scale: Good     Standing balance support: Bilateral upper extremity supported Standing balance-Leahy Scale: Poor                             ADL either performed or assessed with clinical judgement   ADL Overall ADL's : Needs assistance/impaired Eating/Feeding: Independent   Grooming: Independent           Upper Body Dressing : Set up   Lower Body Dressing: Min guard;Sit to/from stand Lower Body Dressing Details (indicate cue type and reason): pt with lob at the EOB x1 posterior. pt with improved balance in the chair with arm rest Toilet Transfer: Min guard           Functional mobility during ADLs: Min guard       Vision         Perception     Praxis      Pertinent Vitals/Pain Pain Assessment: No/denies pain     Hand Dominance Right   Extremity/Trunk Assessment Upper Extremity Assessment Upper Extremity Assessment: Generalized weakness       Cervical / Trunk Assessment Cervical / Trunk Assessment: Normal   Communication Communication Communication: No difficulties;Other (comment)(does not appear to have any teeth. )   Cognition Arousal/Alertness: Awake/alert Behavior During Therapy: WFL for tasks assessed/performed Overall Cognitive Status: Impaired/Different from baseline Area of Impairment: Memory;Problem solving  Memory: Decreased short-term memory       Problem Solving: Slow processing General Comments: pt asking brother for help and with word finding difficulty during session   General Comments       Exercises     Shoulder Instructions      Home Living Family/patient expects to be discharged to:: Private residence Living Arrangements: Other relatives(brother) Available Help at Discharge: Family;Available PRN/intermittently Type of Home: House(townhome) Home Access: Stairs to enter Entrance Stairs-Number of Steps: 1-2 Entrance  Stairs-Rails: Right Home Layout: Two level;Able to live on main level with bedroom/bathroom Alternate Level Stairs-Number of Steps: flight Alternate Level Stairs-Rails: Right Bathroom Shower/Tub: Occupational psychologist: Standard     Home Equipment: Environmental consultant - 2 wheels;Walker - 4 wheels;Cane - quad   Additional Comments: Home Hemodialisis/ has a poodle male dog that he loves to care for at home      Prior Functioning/Environment Level of Independence: Independent with assistive device(s)        Comments: pt report he uses cane/RW depending on how he feels, his brother is with him when he goes out of the house for appointments and his brother drives.  Brother works night shift and helps him PRN during the day.  reports he does teh cooking and walks the dog most of the time        OT Problem List:        OT Treatment/Interventions:      OT Goals(Current goals can be found in the care plan section) Acute Rehab OT Goals Patient Stated Goal: to go home today Potential to Achieve Goals: Good  OT Frequency:     Barriers to D/C:            Co-evaluation              AM-PAC PT "6 Clicks" Daily Activity     Outcome Measure Help from another person eating meals?: None Help from another person taking care of personal grooming?: None Help from another person toileting, which includes using toliet, bedpan, or urinal?: A Little Help from another person bathing (including washing, rinsing, drying)?: A Little Help from another person to put on and taking off regular upper body clothing?: None Help from another person to put on and taking off regular lower body clothing?: A Little 6 Click Score: 21   End of Session Nurse Communication: Mobility status;Precautions  Activity Tolerance: Patient tolerated treatment well Patient left: in bed;with call bell/phone within reach;with family/visitor present  OT Visit Diagnosis: Unsteadiness on feet (R26.81)                 Time: 1342-1401 OT Time Calculation (min): 19 min Charges:  OT General Charges $OT Visit: 1 Visit OT Evaluation $OT Eval Low Complexity: 1 Low   Jeri Modena, OTR/L  Acute Rehabilitation Services Pager: [phone] Office: [phone] .   Parke Poisson B 07/19/2018, 2:44 PM

## 2018-07-19 NOTE — Discharge Summary (Signed)
Physician Discharge Summary  Jeffrey [name] ZOX:[id number] DOB: Nov 19, 1955 DOA: 07/09/2018  PCP: Velna Ochs, MD  Admit date: 07/09/2018 Discharge date: 07/19/2018  Admitted From: Home Disposition:  Home  Recommendations for Outpatient Follow-up:  1. Follow up with PCP in 1-2 weeks 2. Follow up with dialysis as scheduled 3. Follow up with Neurology as scheduled  Home Health:PT, RN  Discharge Condition:Improved CODE STATUS:Full Diet recommendation: Renal   Brief/Interim Summary: 62yo with hx of ESRD, HIV presenting with acute encephalopathy after not going to scheduled dialysis several days prior to admit. Patient developed seizures and required intubation. Pt since extubated and transferred to [facility] where he continues on HD per nephrology  Status epilepticus: no further seizures -Patient is continued on Vimpat -Neurology had been following.  -Pt has remained stable -Seen by PT with recommendation to continue with home health PT  New small acute frontal lobe stroke, likely embolic with severe valve disease -Continue on ASA 81mg  -Seen by Neurology  Acute toxic metabolic encephalopathy -Likely secondary to uremia in additional above CVA -Resolved -Cont with HD as per below. Cont above meds for CVA  Severe AVR -Seen by CTS -Seems stable at present  New thrombocytopenia -HIT negative -Cont on sub q hep for DVT prophylaxis -continue ASA per above -Labs reviewed, improving  Acute respiratory failure with hypoxemia: resolved -SLP following -Continue with aspiration precautions  Severe hypertension post ischemic stroke, difficult to control -Currently on Cardene gtt, continue to wean off as tolerated -continue with low Na diet -continue amlodipine, hydralazine, coreg, clonidine -Recently increased Isosorbide -Continue PRN hydralazine to cover sbp>160 -Improved with HD  ESRD  -Continue with HD per Nephrology  GERD -Continue on PPI as  tolerated  Moderate protein calorie malnutrition   Discharge Diagnoses:  Principal Problem:   Seizure ([place]) Active Problems:   End stage renal disease on home HD   Thrombocytopenia ([facility])   Anemia of chronic disease   Altered mental status   Seizure disorder ([place])   Status epilepticus ([place])   Cerebral thrombosis with cerebral infarction    Discharge Instructions   Allergies as of 07/19/2018      Reactions   Lisinopril Anaphylaxis, Shortness Of Breath   Throat swelling   Penicillins Anaphylaxis, Other (See Comments)   Childhood allergy Has patient had a PCN reaction causing immediate rash, facial/tongue/throat swelling, SOB or lightheadedness with hypotension: Yes Has patient had a PCN reaction causing severe rash involving mucus membranes or skin necrosis: Unk Has patient had a PCN reaction that required hospitalization: Unk Has patient had a PCN reaction occurring within the last 10 years: No If all of the above answers are "NO", then may proceed with Cephalosporin use.   Fentanyl Other (See Comments)   Lethargy, AMS   Morphine And Related Other (See Comments)   "Not Himself"      Medication List    STOP taking these medications   cloNIDine 0.3 mg/24hr patch Commonly known as:  CATAPRES - Dosed in mg/24 hr     TAKE these medications   albuterol (2.5 MG/3ML) 0.083% nebulizer solution Commonly known as:  PROVENTIL Take 2.5 mg by nebulization every 6 (six) hours as needed for wheezing or shortness of breath.   ALPRAZolam 0.25 MG tablet Commonly known as:  XANAX Take 0.25 mg by mouth at bedtime as needed for anxiety.   amLODipine 10 MG tablet Commonly known as:  NORVASC Take 1 tablet (10 mg total) by mouth daily. Start taking on:  07/20/2018 What changed:  medication strength  how much to take   aspirin 81 MG EC tablet Take 1 tablet (81 mg total) by mouth daily. Start taking on:  07/20/2018   bictegravir-emtricitabine-tenofovir AF 50-200-25 MG Tabs  tablet Commonly known as:  BIKTARVY Take 1 tablet by mouth daily.   calcium acetate 667 MG capsule Commonly known as:  PHOSLO Take 1,334-2,001 mg by mouth See admin instructions. Take 2,001 mg three times a day with each meal and 1,334 mg with each snack   carvedilol 25 MG tablet Commonly known as:  COREG Take 1 tablet (25 mg total) by mouth 2 (two) times daily with a meal.   cloNIDine 0.3 MG tablet Commonly known as:  CATAPRES Take 1 tablet (0.3 mg total) by mouth 3 (three) times daily.   COMBIVENT IN Inhale 2 puffs into the lungs every 6 (six) hours as needed (as needed for shortness of breath).   cyclobenzaprine 10 MG tablet Commonly known as:  FLEXERIL Take 1 tablet (10 mg total) by mouth at bedtime as needed for muscle spasms.   epoetin alfa 2000 UNIT/ML injection Commonly known as:  EPOGEN,PROCRIT Inject 2,000 Units into the vein 3 (three) times a week.   ethyl chloride spray Apply 1 application topically daily as needed (HD- to numb).   heparin 1000 UNIT/ML injection Inject 3,000 Units into the vein 4 (four) times a week.   hydrALAZINE 100 MG tablet Commonly known as:  APRESOLINE TAKE 1 TABLET(100 MG) BY MOUTH THREE TIMES DAILY What changed:  See the new instructions.   hydrOXYzine 25 MG tablet Commonly known as:  ATARAX/VISTARIL Take 1 tablet (25 mg total) by mouth 4 (four) times daily as needed for itching.   isosorbide mononitrate 20 MG tablet Commonly known as:  ISMO,MONOKET Take 1 tablet (20 mg total) by mouth 2 (two) times daily.   Lacosamide 150 MG Tabs Take 1 tablet (150 mg total) by mouth 2 (two) times daily. What changed:    medication strength  how much to take   multivitamin Tabs tablet Take 1 tablet by mouth daily.   omeprazole 20 MG capsule Commonly known as:  PRILOSEC Take 1 capsule (20 mg total) by mouth daily. What changed:  when to take this   oxyCODONE 5 MG immediate release tablet Commonly known as:  Oxy IR/ROXICODONE Take 1  tablet (5 mg total) by mouth every 4 (four) hours as needed.   traZODone 50 MG tablet Commonly known as:  DESYREL TAKE 1 TABLET(50 MG) BY MOUTH AT BEDTIME AS NEEDED FOR SLEEP What changed:  See the new instructions.   VOLTAREN 1 % Gel Generic drug:  diclofenac sodium APPLY 2 GRAMS EXTERNALLY TO THE AFFECTED AREA FOUR TIMES DAILY What changed:    how much to take  how to take this  when to take this  reasons to take this  additional instructions      Follow-up Information    Marcial Pacas, MD. Schedule an appointment as soon as possible for a visit in 4 week(s).   Specialty:  Neurology Contact information: [place] [address] [phone]        Velna Ochs, MD. Schedule an appointment as soon as possible for a visit in 2 week(s).   Specialty:  Internal Medicine Contact information: [place] [address] [phone]        Campbell Riches, MD .   Specialty:  Infectious Diseases Contact information: [place] [address]  [phone]        Sherren Mocha, MD .   Specialty:  Cardiology Contact information: [phone] [place] [address] [phone]          Allergies  Allergen Reactions  . Lisinopril Anaphylaxis and Shortness Of Breath    Throat swelling  . Penicillins Anaphylaxis and Other (See Comments)    Childhood allergy Has patient had a PCN reaction causing immediate rash, facial/tongue/throat swelling, SOB or lightheadedness with hypotension: Yes Has patient had a PCN reaction causing severe rash involving mucus membranes or skin necrosis: Unk Has patient had a PCN reaction that required hospitalization: Unk Has patient had a PCN reaction occurring within the last 10 years: No If all of the above answers are "NO", then may proceed with Cephalosporin use.   . Fentanyl Other (See Comments)    Lethargy, AMS  . Morphine And Related Other (See  Comments)    "Not Himself"    Consultations:  Nephrology  CCM  CTS  Neurology  Procedures/Studies: Ct Head Wo Contrast  Result Date: 07/10/2018 CLINICAL DATA:  Seizure.  HIV.  Inpatient. EXAM: CT HEAD WITHOUT CONTRAST TECHNIQUE: Contiguous axial images were obtained from the base of the skull through the vertex without intravenous contrast. COMPARISON:  07/09/2018 head CT. FINDINGS: Brain: No evidence of parenchymal hemorrhage or extra-axial fluid collection. No mass lesion, mass effect, or midline shift. No CT evidence of acute infarction. Nonspecific moderate subcortical and periventricular white matter hypodensity, most in keeping with chronic small vessel ischemic change. Generalized cerebral volume loss. Cerebral ventricle sizes are stable and concordant with the degree of cerebral volume loss. Vascular: No acute abnormality. Skull: No evidence of calvarial fracture. Sinuses/Orbits: The visualized paranasal sinuses are essentially clear. Other: Partial right mastoid effusion. Clear left mastoid air cells. IMPRESSION: 1. No evidence of acute intracranial abnormality. No evidence of calvarial fracture. 2. Generalized cerebral volume loss and moderate chronic small vessel ischemic changes in the cerebral white matter. 3. Partial right mastoid effusion. Electronically Signed   By: Ilona Sorrel M.D.   On: 07/10/2018 23:12   Ct Head Wo Contrast  Result Date: 07/09/2018 CLINICAL DATA:  Altered level of consciousness (LOC), unexplained. Seizure. EXAM: CT HEAD WITHOUT CONTRAST TECHNIQUE: Contiguous axial images were obtained from the base of the skull through the vertex without intravenous contrast. COMPARISON:  Head CT 06/04/2018 FINDINGS: Brain: Unchanged degree of atrophy and moderate to advanced chronic small vessel ischemia from prior exam. No intracranial hemorrhage, mass effect, or midline shift. No hydrocephalus. The basilar cisterns are patent. No evidence of territorial infarct or acute  ischemia. No extra-axial or intracranial fluid collection. Vascular: No hyperdense vessel. Skull: No fracture or focal lesion. Sinuses/Orbits: Unchanged mild opacification of right mastoid air cells. No acute findings. Prior left cataract resection. Other: None. IMPRESSION: 1.  No acute intracranial abnormality. 2. Unchanged atrophy and chronic small vessel ischemia. Electronically Signed   By: Keith Rake M.D.   On: 07/09/2018 23:40   Mr Jodene Nam Head Wo Contrast  Result Date: 07/14/2018 CLINICAL DATA:  Somnolent. Follow-up infarct. History of end-stage renal disease on dialysis, hepatitis, HIV. EXAM: MRA HEAD WITHOUT CONTRAST TECHNIQUE: Angiographic images of the Circle of Willis were obtained using MRA technique without intravenous contrast. COMPARISON:  MRI head July 12, 2017 FINDINGS: ANTERIOR CIRCULATION: Normal flow related enhancement of the included cervical, petrous, cavernous and supraclinoid internal carotid arteries. Patent anterior communicating artery. Patent anterior and middle cerebral arteries. No large vessel occlusion, flow limiting stenosis,  aneurysm. POSTERIOR CIRCULATION: Codominant vertebral arteries. Vertebrobasilar arteries are patent, with normal flow related enhancement of the main branch vessels. Fenestrated proximal basilar artery. Patent posterior cerebral arteries. Small RIGHT posterior communicating artery present. No large vessel occlusion, flow limiting stenosis,  aneurysm. ANATOMIC VARIANTS: None. Source images and MIP images were reviewed. IMPRESSION: Negative MRA head. Electronically Signed   By: Elon Alas M.D.   On: 07/14/2018 01:43   Mr Brain Wo Contrast  Result Date: 07/12/2018 CLINICAL DATA:  62 year old male with HIV and in stage renal disease on dialysis status post seizures. EXAM: MRI HEAD WITHOUT CONTRAST TECHNIQUE: Multiplanar, multiecho pulse sequences of the brain and surrounding structures were obtained without intravenous contrast. COMPARISON:   Head CT 07/10/2018 and earlier.  Brain MRI 04/11/2018. FINDINGS: Brain: There is a linear 12 millimeter focus of restricted diffusion in the anterior left frontal lobe subcortical white matter (series 5, image 63). Associated T2 and FLAIR hyperintensity with no hemorrhage or mass effect. Superimposed advanced chronic cerebral white matter T2 and FLAIR hyperintensity. There is a small area of faint ring like trace diffusion abnormality in the anterior right corona radiata on series 5, image 68 with associated T2 and FLAIR hyperintensity which is new since July. No other restricted diffusion. No midline shift, mass effect, evidence of mass lesion, ventriculomegaly, extra-axial collection or acute intracranial hemorrhage. Cervicomedullary junction and pituitary are within normal limits. Stable cerebral volume. Scattered chronic microhemorrhages in the brain are stable since July except for that in the left temporal lobe on series 11, image 24. No cortical encephalomalacia identified. Relatively little signal abnormality in the deep gray matter nuclei, although a small lacune in the left thalamus on series 9, image 15 is new since July. Thin slice temporal lobe imaging. The hippocampal formations appear symmetric and within normal limits. Vascular: Major intracranial vascular flow voids are stable. Skull and upper cervical spine: Negative visible cervical spine. Visualized bone marrow signal is within normal limits. Sinuses/Orbits: Stable and negative. Other: Intubated. Small volume fluid in the pharynx. Stable mild right mastoid effusion. IMPRESSION: 1. Small acute white matter infarct in the anterior left frontal lobe. No associated hemorrhage or mass effect. 2. Small late subacute to early chronic white matter lacune in the anterior right corona radiata. Also, small lacunar infarct in the left thalamus and microhemorrhage in the left temporal lobe are chronic but new since July. 3. Underlying advanced chronic  cerebral white matter disease and scattered chronic microhemorrhages. Electronically Signed   By: Genevie Ann M.D.   On: 07/12/2018 16:07   Dg Chest Port 1 View  Result Date: 07/14/2018 CLINICAL DATA:  Respiratory failure. EXAM: PORTABLE CHEST 1 VIEW COMPARISON:  07/13/2018 FINDINGS: Stable support apparatus. Enlarged cardiac silhouette.  Mediastinal contours appear intact. No evidence of pneumothorax. Mild interstitial pulmonary edema. Persistent left lung base opacity. Osseous structures are without acute abnormality. Soft tissues are grossly normal. IMPRESSION: Persistent left lung base opacity likely represents pleural effusion with associated atelectasis or airspace consolidation. Probable mild interstitial pulmonary edema. Electronically Signed   By: Fidela Salisbury M.D.   On: 07/14/2018 10:18   Dg Chest Port 1 View  Result Date: 07/13/2018 CLINICAL DATA:  Acute respiratory failure with hypoxemia EXAM: PORTABLE CHEST 1 VIEW COMPARISON:  07/12/2018 FINDINGS: Endotracheal tube tip 4.2 cm above the carina. Nasogastric tube enters the stomach. Right IJ central line tip: Upper SVC. Continued left basilar airspace opacity with obscuration of the left hemidiaphragm. The patient is rotated to the left on today's radiograph, reducing  diagnostic sensitivity and specificity. Hazy opacity at the right lung base is stable. Borderline enlargement of the cardiopericardial silhouette. Severe degenerative left glenohumeral arthropathy. IMPRESSION: 1. Overall stable appearance of the chest, with airspace opacity obscuring the left hemidiaphragm and some hazy opacity at the right lung base. Based on recent imaging, a component of the left basilar density is probably attributable to pleural effusion. 2. Borderline enlargement of the cardiopericardial silhouette. 3. Severe degenerative left glenohumeral arthropathy. Electronically Signed   By: Van Clines M.D.   On: 07/13/2018 10:30   Dg Chest Port 1  View  Result Date: 07/12/2018 CLINICAL DATA:  Acute respiratory failure with hypoxemia. EXAM: PORTABLE CHEST 1 VIEW COMPARISON:  Radiograph July 10, 2018. FINDINGS: Stable cardiomediastinal silhouette. Endotracheal nasogastric tubes are unchanged in position. Right internal jugular catheter is unchanged in position. No pneumothorax is noted. Minimal right basilar subsegmental atelectasis is noted. Mild left basilar atelectasis or infiltrate is noted with associated pleural effusion. Severe degenerative changes seen involving the left glenohumeral joint. IMPRESSION: Stable support apparatus. Mild left basilar atelectasis or infiltrate is noted with associated pleural effusion. Electronically Signed   By: Marijo Conception, M.D.   On: 07/12/2018 07:37   Dg Chest Port 1 View  Result Date: 07/11/2018 CLINICAL DATA:  Central line placement EXAM: PORTABLE CHEST 1 VIEW COMPARISON:  07/10/2018 FINDINGS: Interval placement of right internal jugular central line. The tip is in the SVC. NG tube enters the stomach. Endotracheal tube is stable. Moderate to large left effusion. Left lung airspace disease, most confluent in the left base, stable. No pneumothorax. IMPRESSION: Interval placement of right central line with the tip in the SVC. No pneumothorax. NG tube is in place which enters the stomach. Otherwise no change. Electronically Signed   By: Rolm Baptise M.D.   On: 07/11/2018 00:09   Dg Chest Port 1 View  Result Date: 07/10/2018 CLINICAL DATA:  Endotracheal tube adjustment. EXAM: PORTABLE CHEST 1 VIEW COMPARISON:  Earlier this day at 2112 hour FINDINGS: The endotracheal tube tip is at the thoracic inlet 5.4 cm from the carina. Lower thorax/lung bases are not entirely included in the field of view. No significant change in left lung base opacity and pleural effusion. Unchanged cardiomediastinal contours. No pneumothorax. IMPRESSION: 1. Endotracheal tube tip at the thoracic inlet 5.4 cm from the carina. 2. Exam  is otherwise unchanged from radiograph 12 minutes prior, allowing for lung bases not included in the field of view. Electronically Signed   By: Keith Rake M.D.   On: 07/10/2018 21:41   Dg Chest Port 1 View  Result Date: 07/10/2018 CLINICAL DATA:  Seizure. EXAM: PORTABLE CHEST 1 VIEW COMPARISON:  Radiograph of July 09, 2018. FINDINGS: Stable cardiomediastinal silhouette. Endotracheal tube is seen projected over tracheal air shadow with distal tip 10 cm above the carina. No pneumothorax is noted. Left apical fluid is noted. Stable left basilar atelectasis or infiltrate is noted with associated pleural effusion. Mild right basilar subsegmental atelectasis is noted. Bony thorax is unremarkable. IMPRESSION: Stable left basilar atelectasis or infiltrate is noted with associated pleural effusion. Endotracheal tube appears to be projected over the trachea, but advancement by 3-4 cm is recommended. Electronically Signed   By: Marijo Conception, M.D.   On: 07/10/2018 21:35   Dg Chest Port 1 View  Result Date: 07/09/2018 CLINICAL DATA:  Cough. EXAM: PORTABLE CHEST 1 VIEW COMPARISON:  Chest radiograph 07/04/2018. Additional prior chest radiographs reviewed. FINDINGS: Left pleural effusion and basilar opacity is unchanged from  recent exam. Progressive peribronchial thickening. No new airspace disease. Unchanged heart size and mediastinal contours. No pneumothorax. IMPRESSION: 1. Increased peribronchial thickening most suggestive of progressive pulmonary edema, atypical infection felt less likely. 2. Unchanged left basilar opacity and pleural effusion from recent exam. Electronically Signed   By: Keith Rake M.D.   On: 07/09/2018 22:53   Dg Chest Port 1 View  Result Date: 07/04/2018 CLINICAL DATA:  Postoperative follow-up of fistula repair. EXAM: PORTABLE CHEST 1 VIEW COMPARISON:  06/04/2018 FINDINGS: Chronic cardiomegaly and aortic tortuosity. Worsened appearance of the lungs with increased pleural  fluid on the left and worsened volume loss/infiltrate at the left lower lobe and worsened patchy density or atelectasis at the right lung base. Upper lungs remain clear. IMPRESSION: Worsening compared to the study of 1 month ago. Left effusion with worsened atelectasis/infiltrate in the left lower lobe. Slight worsened patchy density at the right lung base. Electronically Signed   By: Nelson Chimes M.D.   On: 07/04/2018 17:20   Dg Abd Portable 1v  Result Date: 07/11/2018 CLINICAL DATA:  OG tube placement. EXAM: PORTABLE ABDOMEN - 1 VIEW COMPARISON:  Chest radiograph earlier this day. FINDINGS: Tip of the enteric tube below the diaphragm in the stomach, side-port in the region of gastroesophageal junction. Gaseous gastric distension noted in the upper abdomen. No evidence of free air. IMPRESSION: Tip of the enteric tube below the diaphragm in the stomach. Side-port in the region of the gastroesophageal junction. Advancement of 5 cm would definitively place the side-port in the stomach. Gaseous gastric distension. Electronically Signed   By: Keith Rake M.D.   On: 07/11/2018 00:25     Subjective: Eager to go home  Discharge Exam: Vitals:   07/19/18 1302 07/19/18 1302  BP: (!) 144/128 (!) 144/128  Pulse: (!) 59 (!) 59  Resp: (!) 24 (!) 24  Temp: 98.6 F (37 C) 98.6 F (37 C)  SpO2: 100% 100%   Vitals:   07/19/18 0900 07/19/18 1035 07/19/18 1302 07/19/18 1302  BP: (!) 144/66 113/67 (!) 144/128 (!) 144/128  Pulse: 60 (!) 57 (!) 59 (!) 59  Resp: 19 20 (!) 24 (!) 24  Temp:  98.6 F (37 C) 98.6 F (37 C) 98.6 F (37 C)  TempSrc:  Oral Oral Oral  SpO2: 98% 100% 100% 100%  Weight:      Height:        General: Pt is alert, awake, not in acute distress Cardiovascular: RRR, S1/S2 +, no rubs, no gallops Respiratory: CTA bilaterally, no wheezing, no rhonchi Abdominal: Soft, NT, ND, bowel sounds + Extremities: no edema, no cyanosis   The results of significant diagnostics from this  hospitalization (including imaging, microbiology, ancillary and laboratory) are listed below for reference.     Microbiology: Recent Results (from the past 240 hour(s))  Culture, blood (routine x 2)     Status: None   Collection Time: 07/10/18 10:36 PM  Result Value Ref Range Status   Specimen Description BLOOD RIGHT WRIST  Final   Special Requests   Final    BOTTLES DRAWN AEROBIC AND ANAEROBIC Blood Culture adequate volume   Culture   Final    NO GROWTH 5 DAYS Performed at [facility], [address], [place], [place] [address]    Report Status 07/15/2018 FINAL  Final  Culture, blood (routine x 2)     Status: None   Collection Time: 07/10/18 10:36 PM  Result Value Ref Range Status   Specimen Description  BLOOD RIGHT HAND  Final   Special Requests   Final    BOTTLES DRAWN AEROBIC AND ANAEROBIC Blood Culture adequate volume   Culture   Final    NO GROWTH 5 DAYS Performed at [facility], [address] [place], [place] [address]    Report Status 07/15/2018 FINAL  Final  Culture, respiratory (non-expectorated)     Status: None   Collection Time: 07/11/18 12:20 AM  Result Value Ref Range Status   Specimen Description TRACHEAL ASPIRATE  Final   Special Requests NONE  Final   Gram Stain   Final    MODERATE WBC PRESENT, PREDOMINANTLY PMN FEW GRAM POSITIVE COCCI    Culture   Final    Consistent with normal respiratory flora. Performed at [facility], [place] [address] [place], [place] [address]    Report Status 07/13/2018 FINAL  Final     Labs: BNP (last 3 results) Recent Labs    04/17/18 1035 05/01/18 2117 05/27/18 2012  BNP 1,944.4* 909.6* 0,263.7*   Basic Metabolic Panel: Recent Labs  Lab 07/13/18 0500  07/14/18 0510  07/15/18 0558 07/15/18 1600 07/16/18 0526 07/17/18 0547 07/18/18 0859  NA 136   < > 137   < > 138  137 137 136 137 137  K 4.9   < > 3.4*   < > 3.7  3.7 3.4* 3.8 3.8 4.1  CL 102   < > 100   < > 102  100 100 99 97* 100  CO2  25   < > 28   < > 26  26 29 27 28 27   GLUCOSE 144*   < > 135*   < > 95  98 130* 102* 107* 96  BUN 54*   < > 27*   < > 34*  34* 12 20 16  28*  CREATININE 7.50*   < > 4.50*   < > 6.56*  6.41* 3.63* 4.82* 4.27* 6.41*  CALCIUM 8.6*   < > 8.7*   < > 8.5*  8.7* 8.5* 8.6* 8.7* 8.6*  MG 2.2  --  2.0  --  1.9  --   --  1.7  --   PHOS 2.8   < > 2.1*   < > 2.5 1.5* 2.1* 2.5 3.1   < > = values in this interval not displayed.   Liver Function Tests: Recent Labs  Lab 07/15/18 0558 07/15/18 1600 07/16/18 0526 07/17/18 0547 07/18/18 0859  ALBUMIN 2.8* 3.0* 2.7* 2.7* 2.7*   No results for input(s): LIPASE, AMYLASE in the last 168 hours. No results for input(s): AMMONIA in the last 168 hours. CBC: Recent Labs  Lab 07/13/18 0500 07/14/18 0510 07/15/18 0558 07/18/18 0859  WBC 4.5 5.4 6.4 6.3  NEUTROABS 3.8  --  4.3  --   HGB 7.8* 8.5* 8.6* 8.9*  HCT 26.1* 28.5* 29.3* 29.1*  MCV 93.9 95.3 95.1 95.7  PLT 79* 94* 145* 185   Cardiac Enzymes: No results for input(s): CKTOTAL, CKMB, CKMBINDEX, TROPONINI in the last 168 hours. BNP: Invalid input(s): POCBNP CBG: Recent Labs  Lab 07/18/18 1454 07/18/18 2013 07/19/18 0000 07/19/18 0355 07/19/18 0731  GLUCAP 136* 135* 109* 109* 120*   D-Dimer No results for input(s): DDIMER in the last 72 hours. Hgb A1c No results for input(s): HGBA1C in the last 72 hours. Lipid Profile Recent Labs    07/16/18 2144  TRIG 77   Thyroid function studies No results for input(s): TSH, T4TOTAL, T3FREE, THYROIDAB  in the last 72 hours.  Invalid input(s): FREET3 Anemia work up No results for input(s): VITAMINB12, FOLATE, FERRITIN, TIBC, IRON, RETICCTPCT in the last 72 hours. Urinalysis    Component Value Date/Time   COLORURINE AMBER (A) 06/14/2011 1513   APPEARANCEUR CLOUDY (A) 06/14/2011 1513   LABSPEC 1.020 06/14/2011 1513   PHURINE 6.0 06/14/2011 1513   GLUCOSEU NEGATIVE 06/14/2011 1513   HGBUR LARGE (A) 06/14/2011 1513   BILIRUBINUR SMALL  (A) 06/14/2011 1513   KETONESUR NEGATIVE 06/14/2011 1513   PROTEINUR >300 (A) 06/14/2011 1513   UROBILINOGEN 0.2 06/14/2011 1513   NITRITE NEGATIVE 06/14/2011 1513   LEUKOCYTESUR SMALL (A) 06/14/2011 1513   Sepsis Labs Invalid input(s): PROCALCITONIN,  WBC,  LACTICIDVEN Microbiology Recent Results (from the past 240 hour(s))  Culture, blood (routine x 2)     Status: None   Collection Time: 07/10/18 10:36 PM  Result Value Ref Range Status   Specimen Description BLOOD RIGHT WRIST  Final   Special Requests   Final    BOTTLES DRAWN AEROBIC AND ANAEROBIC Blood Culture adequate volume   Culture   Final    NO GROWTH 5 DAYS Performed at [facility], [address] [place], [place] [address]    Report Status 07/15/2018 FINAL  Final  Culture, blood (routine x 2)     Status: None   Collection Time: 07/10/18 10:36 PM  Result Value Ref Range Status   Specimen Description BLOOD RIGHT HAND  Final   Special Requests   Final    BOTTLES DRAWN AEROBIC AND ANAEROBIC Blood Culture adequate volume   Culture   Final    NO GROWTH 5 DAYS Performed at [facility], [place] [address], [place], [place] [address]    Report Status 07/15/2018 FINAL  Final  Culture, respiratory (non-expectorated)     Status: None   Collection Time: 07/11/18 12:20 AM  Result Value Ref Range Status   Specimen Description TRACHEAL ASPIRATE  Final   Special Requests NONE  Final   Gram Stain   Final    MODERATE WBC PRESENT, PREDOMINANTLY PMN FEW GRAM POSITIVE COCCI    Culture   Final    Consistent with normal respiratory flora. Performed at [facility], [place] [address], [place], [place] [address]    Report Status 07/13/2018 FINAL  Final   Time spent: 30 min  SIGNED:   Marylu Lund, MD  [facility] 07/19/2018, 1:11 PM  If 7PM-7AM, please contact night-coverage

## 2018-07-19 NOTE — Care Management Note (Signed)
Case Management Note  Patient Details  Name: Jeffrey [name] MRN: [id number] Date of Birth: Mar 12, 1956  Subjective/Objective:     Pt admitted with seizures. He is from home with his brother who does home HD for the patient. They go through [place] [facility] out of [place] [phone]).  Pt has: cane and walker Pt denies issues obtaining his medications and no transportation issues.     Pt was active with [facility] for PT/RN prior to admission.             Action/Plan: Pt discharging home with resumption orders for [facility] services. CM met with the patient and his brother and they want to continue with [facility]. CM called Cherly with [facility] and informed her of resumption orders.  Brother will provide transport home.   Expected Discharge Date:  07/19/18               Expected Discharge Plan:  [place]  In-House Referral:     Discharge planning Services  CM Consult  Post Acute Care Choice:    Choice offered to:  Patient, Sibling  DME Arranged:    DME Agency:     HH Arranged:  PT, RN HH Agency:  [facility]  Status of Service:  Completed, signed off  If discussed at [place] of Stay Meetings, dates discussed:    Additional Comments:  Pollie Friar, RN 07/19/2018, 1:15 PM

## 2018-07-19 NOTE — Progress Notes (Signed)
[facility] Progress Note    Assessment/ Plan:   62yo malewith ESRD (on home HD), Hep B, HTN, HIV, Hx seizure disorder (over past 5 mo only), and asthma who was admitted with AMS and seizures. Revision of lt cimino 10/14 by Dr. Scot Dock and stayed afterwards for HTN urgency and AMS. On readmission he had a TC Sz witnessed by the brother.  1. Seizure: Known seizure d/o- on Vimpat prior to admit, likely worsened by uremia. CT neg for acute finding. Neuro involved-remains on vimpat 2. AMS:MRI did show small CVA, not felt to be too clinically signif. Now uremia is better- more alert and extubated On ASA-  3. ESRD: Usual home HD 4 days a week - last dialyzed 10/12 at home, BUN/Cr high on admit. Tried to do IHD on 10/20- had sz- then moved to ICU for CRRT from 10/20-10/22 then IHD at bedside 10/23 and 10/25 and 10/26. He would prefer to do 4 days a week at 3.5 hourswhile here- will plan on MWFSat while here.  - Tolerated tx yesterday and asking to go home; brother is bedside as well  - From renal standpoint he is stable for d/c. He understands the need to dialyze consistently and not miss treatments + challenging his EDW. Brother is the one who cannulates the fistula at home.  4. Hypertension/volume: BP was very high, no peripheral edemaHighly variable- norvasc 10, coreg 25 BID, clonidine 0.3 TID, hydralazine 100 TID- better afterchallenge with HD - he says his EDW is 76 but tries to get under- is now 77, will cont to challenge- pt says best BP he gets is 160 on 4 drug reg  5. Anemia:Hgb 11.2, dropped to the 9/s, then7's. added ESA- iron stores low, repleting - platelets dropping as well, held heparin and check HIT - was neg- hgb and plt rebounded - can re introduce heparin with HD as needed  6. Metabolic bone disease:Ca ok, Phos 2.5, binders not given, now held  7. Hep B + 8. HIV 9. Dispo- out of ICU- then possibly home tonight?  Subjective:    Doing well.  Denies f/c/n/v/dyspnea/HA   Objective:   BP (!) 144/128 (BP Location: Right Arm) Comment: RN Notified  Pulse (!) 59   Temp 98.6 F (37 C) (Oral)   Resp (!) 24   Ht 6' (1.829 m)   Wt 72.5 kg   SpO2 100%   BMI 21.68 kg/m   Intake/Output Summary (Last 24 hours) at 07/19/2018 1406 Last data filed at 07/19/2018 0200 Gross per 24 hour  Intake 323 ml  Output -  Net 323 ml   Weight change: -0.7 kg  Physical Exam: General:  alert and cooperaetive Heart: RRR Lungs:  clear  Abdomen: soft, non tender Extremities: no edema Dialysis Access: has left AVF- patent w/ great bruit  Imaging: No results found.  Labs: BMET Recent Labs  Lab 07/14/18 0510 07/14/18 1841 07/15/18 0558 07/15/18 1600 07/16/18 0526 07/17/18 0547 07/18/18 0859  NA 137 138 138  137 137 136 137 137  K 3.4* 3.8 3.7  3.7 3.4* 3.8 3.8 4.1  CL 100 101 102  100 100 99 97* 100  CO2 28 27 26  26 29 27 28 27   GLUCOSE 135* 121* 95  98 130* 102* 107* 96  BUN 27* 32* 34*  34* 12 20 16  28*  CREATININE 4.50* 5.66* 6.56*  6.41* 3.63* 4.82* 4.27* 6.41*  CALCIUM 8.7* 8.6* 8.5*  8.7* 8.5* 8.6* 8.7* 8.6*  PHOS 2.1*  2.5 2.5 1.5* 2.1* 2.5 3.1   CBC Recent Labs  Lab 07/13/18 0500 07/14/18 0510 07/15/18 0558 07/18/18 0859  WBC 4.5 5.4 6.4 6.3  NEUTROABS 3.8  --  4.3  --   HGB 7.8* 8.5* 8.6* 8.9*  HCT 26.1* 28.5* 29.3* 29.1*  MCV 93.9 95.3 95.1 95.7  PLT 79* 94* 145* 185    Medications:    . amLODipine  10 mg Oral Daily  . aspirin EC  81 mg Oral Daily  . bictegravir-emtricitabine-tenofovir AF  1 tablet Oral Daily  . carvedilol  25 mg Oral BID WC  . cloNIDine  0.3 mg Oral TID  . darbepoetin (ARANESP) injection - DIALYSIS  60 mcg Intravenous Q Wed-HD  . docusate sodium  100 mg Oral BID  . famotidine  20 mg Per Tube Daily  . feeding supplement (NEPRO CARB STEADY)  237 mL Oral BID BM  . heparin injection (subcutaneous)  5,000 Units Subcutaneous Q8H  . hydrALAZINE  100 mg Oral TID  .  insulin aspart  1-3 Units Subcutaneous Q4H  . isosorbide mononitrate  20 mg Oral BID  . ketorolac  1 drop Left Eye QID  . lacosamide  150 mg Oral BID  . multivitamin  1 tablet Oral Daily  . multivitamin  1 tablet Oral Daily  . ofloxacin  1 drop Left Eye QID  . pantoprazole  40 mg Oral Daily  . polyethylene glycol  17 g Oral Daily  . prednisoLONE acetate  1 drop Left Eye QID  . sodium chloride flush  3 mL Intravenous Q12H  . sodium chloride flush  3 mL Intravenous Q12H      Otelia Santee, MD 07/19/2018, 2:06 PM

## 2018-07-19 NOTE — Plan of Care (Signed)
Adequate for discharge.

## 2018-07-20 ENCOUNTER — Other Ambulatory Visit: Payer: Self-pay

## 2018-07-20 ENCOUNTER — Telehealth: Payer: Self-pay

## 2018-07-20 ENCOUNTER — Encounter: Payer: Self-pay | Admitting: *Deleted

## 2018-07-20 ENCOUNTER — Inpatient Hospital Stay: Admit: 2018-07-20 | Payer: Medicare Other | Admitting: Thoracic Surgery (Cardiothoracic Vascular Surgery)

## 2018-07-20 SURGERY — REPLACEMENT, AORTIC VALVE, OPEN
Anesthesia: General | Site: Chest

## 2018-07-20 NOTE — Patient Outreach (Signed)
[facility]) Care Management  07/20/2018  Jeffrey [name] 1956-04-23 [id number]    Referral received 07/05/18. Client discharged from hospital post observation stay (10/14-10/15). Client had Arterio-venous fistula repair on 10/14 and was hospitalized observation due to hypertensive urgency.Client readmitted 10/19-10/29 with Seizure; new small acute frontal lobe stroke; acute metabolic encephalopathy.  Primary care office listed as completing transition of care.   Per chart: 6 admissions in 6 months; 6 ED visits in 6 months.  RNCM called [phone] to follow up. No answer. HIPPA compliant message left. RNCM called [phone] not accepting calls at this time. Unable to leave message.  Plan: send unsuccessful outreach letter and continue to attempt to reach client within 3-4 business days.  [name], RN, MSN, [place] Coordinator Cell: [phone]

## 2018-07-20 NOTE — Telephone Encounter (Signed)
Left message to call back to reschedule cath prior to surgery.

## 2018-07-20 NOTE — Telephone Encounter (Signed)
-----   Message from Laury Deep, RN sent at 07/19/2018  1:50 PM EDT ----- Juluis Rainier,  I have r/s his srgy to 11/7 with Dr. Roxan Hockey so if the cath can be between now and then please! :)   Thanks, Thurmond Butts ----- Message ----- From: Laury Deep, RN Sent: 07/15/2018   4:50 PM EDT To: Theodoro Parma, RN  Hey,   Patient missed heart cath due to being in hospital.  Can you r/s and then we can r/s AVR with Dr. Roxan Hockey?  He was scheduled for srgy 10/30.  Thanks, [employer]

## 2018-07-21 ENCOUNTER — Encounter (HOSPITAL_COMMUNITY): Payer: Medicare Other

## 2018-07-21 ENCOUNTER — Telehealth: Payer: Self-pay | Admitting: *Deleted

## 2018-07-21 NOTE — Telephone Encounter (Signed)
Rescheduled [facility] for Monday, November 4 with Dr. Ellyn Hack. Dx: AI Arrival time: 0530 for 0730 case Patient's brother (caregiver) agrees with treatment plan.

## 2018-07-21 NOTE — Telephone Encounter (Signed)
-----   Message from Laury Deep, RN sent at 07/21/2018  1:23 PM EDT ----- Jeffrey [name],   Patients brother called back and are good with the plan now.  So Srgy with Roxan Hockey is 11/7 but all preop is 11/5.  So it might be someone else has to cath but that only leaves Friday or Monday to get cathed...  Brother said to call his cell at [phone] to schedule!  Thanks, [employer]

## 2018-07-21 NOTE — Telephone Encounter (Addendum)
Pt contacted pre-catheterization scheduled at [facility] for: Monday July 25, 2018 7:30 AM Verified arrival time and place: [place] Entrance A at: 5:30 AM  No solid food after midnight prior to cath, clear liquids until 5 AM day of procedure. Contrast allergy:no Verified no diabetes medication.  AM meds can be  taken pre-cath with sip of water including: ASA 81 mg  Confirmed patient has responsible person to drive home post procedure and for 24 hours after you arrive home: yes   I spoke with patient's brother, Mr Shanda Bumps, he verbalized understanding, thanked me for call.

## 2018-07-22 ENCOUNTER — Encounter (HOSPITAL_COMMUNITY): Payer: Medicare Other

## 2018-07-22 DIAGNOSIS — Z992 Dependence on renal dialysis: Secondary | ICD-10-CM | POA: Diagnosis not present

## 2018-07-22 DIAGNOSIS — E46 Unspecified protein-calorie malnutrition: Secondary | ICD-10-CM | POA: Diagnosis not present

## 2018-07-22 DIAGNOSIS — N186 End stage renal disease: Secondary | ICD-10-CM | POA: Diagnosis not present

## 2018-07-22 DIAGNOSIS — N2581 Secondary hyperparathyroidism of renal origin: Secondary | ICD-10-CM | POA: Diagnosis not present

## 2018-07-22 DIAGNOSIS — D631 Anemia in chronic kidney disease: Secondary | ICD-10-CM | POA: Diagnosis not present

## 2018-07-22 DIAGNOSIS — I120 Hypertensive chronic kidney disease with stage 5 chronic kidney disease or end stage renal disease: Secondary | ICD-10-CM | POA: Diagnosis not present

## 2018-07-22 DIAGNOSIS — I2510 Atherosclerotic heart disease of native coronary artery without angina pectoris: Secondary | ICD-10-CM | POA: Diagnosis not present

## 2018-07-25 ENCOUNTER — Other Ambulatory Visit: Payer: Self-pay

## 2018-07-25 ENCOUNTER — Encounter (HOSPITAL_COMMUNITY)
Admission: RE | Disposition: A | Discharge status: Home / Self Care | Payer: Self-pay | Source: Ambulatory Visit | Attending: Cardiology

## 2018-07-25 ENCOUNTER — Ambulatory Visit (HOSPITAL_BASED_OUTPATIENT_CLINIC_OR_DEPARTMENT_OTHER)
Admission: RE | Admit: 2018-07-25 | Discharge: 2018-07-25 | Disposition: A | Discharge status: Home / Self Care | Payer: Medicare Other | Source: Ambulatory Visit | Attending: Cardiology | Admitting: Cardiology

## 2018-07-25 ENCOUNTER — Ambulatory Visit: Payer: Medicare Other

## 2018-07-25 DIAGNOSIS — Z885 Allergy status to narcotic agent status: Secondary | ICD-10-CM | POA: Insufficient documentation

## 2018-07-25 DIAGNOSIS — Z8249 Family history of ischemic heart disease and other diseases of the circulatory system: Secondary | ICD-10-CM | POA: Insufficient documentation

## 2018-07-25 DIAGNOSIS — Z992 Dependence on renal dialysis: Secondary | ICD-10-CM

## 2018-07-25 DIAGNOSIS — D132 Benign neoplasm of duodenum: Secondary | ICD-10-CM

## 2018-07-25 DIAGNOSIS — B181 Chronic viral hepatitis B without delta-agent: Secondary | ICD-10-CM

## 2018-07-25 DIAGNOSIS — D631 Anemia in chronic kidney disease: Secondary | ICD-10-CM

## 2018-07-25 DIAGNOSIS — I351 Nonrheumatic aortic (valve) insufficiency: Secondary | ICD-10-CM

## 2018-07-25 DIAGNOSIS — Z79899 Other long term (current) drug therapy: Secondary | ICD-10-CM | POA: Insufficient documentation

## 2018-07-25 DIAGNOSIS — M1990 Unspecified osteoarthritis, unspecified site: Secondary | ICD-10-CM

## 2018-07-25 DIAGNOSIS — I2510 Atherosclerotic heart disease of native coronary artery without angina pectoris: Secondary | ICD-10-CM | POA: Diagnosis present

## 2018-07-25 DIAGNOSIS — I120 Hypertensive chronic kidney disease with stage 5 chronic kidney disease or end stage renal disease: Secondary | ICD-10-CM | POA: Insufficient documentation

## 2018-07-25 DIAGNOSIS — E039 Hypothyroidism, unspecified: Secondary | ICD-10-CM | POA: Insufficient documentation

## 2018-07-25 DIAGNOSIS — G4733 Obstructive sleep apnea (adult) (pediatric): Secondary | ICD-10-CM

## 2018-07-25 DIAGNOSIS — Z87891 Personal history of nicotine dependence: Secondary | ICD-10-CM | POA: Insufficient documentation

## 2018-07-25 DIAGNOSIS — Z888 Allergy status to other drugs, medicaments and biological substances status: Secondary | ICD-10-CM

## 2018-07-25 DIAGNOSIS — F419 Anxiety disorder, unspecified: Secondary | ICD-10-CM | POA: Insufficient documentation

## 2018-07-25 DIAGNOSIS — N186 End stage renal disease: Secondary | ICD-10-CM | POA: Insufficient documentation

## 2018-07-25 DIAGNOSIS — J45909 Unspecified asthma, uncomplicated: Secondary | ICD-10-CM | POA: Insufficient documentation

## 2018-07-25 DIAGNOSIS — I428 Other cardiomyopathies: Secondary | ICD-10-CM

## 2018-07-25 DIAGNOSIS — Z880 Allergy status to penicillin: Secondary | ICD-10-CM

## 2018-07-25 DIAGNOSIS — Z9889 Other specified postprocedural states: Secondary | ICD-10-CM

## 2018-07-25 DIAGNOSIS — B20 Human immunodeficiency virus [HIV] disease: Secondary | ICD-10-CM | POA: Insufficient documentation

## 2018-07-25 DIAGNOSIS — Z955 Presence of coronary angioplasty implant and graft: Secondary | ICD-10-CM

## 2018-07-25 DIAGNOSIS — R569 Unspecified convulsions: Secondary | ICD-10-CM

## 2018-07-25 HISTORY — PX: RIGHT/LEFT HEART CATH AND CORONARY ANGIOGRAPHY: CATH118266

## 2018-07-25 LAB — POCT I-STAT 3, VENOUS BLOOD GAS (G3P V)
BICARBONATE: 25.2 mmol/L (ref 20.0–28.0)
BICARBONATE: 25.4 mmol/L (ref 20.0–28.0)
Bicarbonate: 25.3 mmol/L (ref 20.0–28.0)
O2 SAT: 83 %
O2 Saturation: 80 %
O2 Saturation: 80 %
PH VEN: 7.365 (ref 7.250–7.430)
PO2 VEN: 46 mmHg — AB (ref 32.0–45.0)
PO2 VEN: 48 mmHg — AB (ref 32.0–45.0)
TCO2: 27 mmol/L (ref 22–32)
TCO2: 26 mmol/L (ref 22–32)
TCO2: 27 mmol/L (ref 22–32)
pCO2, Ven: 44.3 mmHg (ref 44.0–60.0)
pCO2, Ven: 43.1 mmHg — ABNORMAL LOW (ref 44.0–60.0)
pCO2, Ven: 44.5 mmHg (ref 44.0–60.0)
pH, Ven: 7.365 (ref 7.250–7.430)
pH, Ven: 7.375 (ref 7.250–7.430)
pO2, Ven: 46 mmHg — ABNORMAL HIGH (ref 32.0–45.0)

## 2018-07-25 LAB — POCT I-STAT 3, ART BLOOD GAS (G3+)
ACID-BASE DEFICIT: 1 mmol/L (ref 0.0–2.0)
Bicarbonate: 24.3 mmol/L (ref 20.0–28.0)
O2 SAT: 95 %
PCO2 ART: 41.9 mmHg (ref 32.0–48.0)
TCO2: 26 mmol/L (ref 22–32)
pH, Arterial: 7.371 (ref 7.350–7.450)
pO2, Arterial: 80 mmHg — ABNORMAL LOW (ref 83.0–108.0)

## 2018-07-25 LAB — PROTIME-INR
INR: 1.09
PROTHROMBIN TIME: 14 s (ref 11.4–15.2)

## 2018-07-25 SURGERY — RIGHT/LEFT HEART CATH AND CORONARY ANGIOGRAPHY
Anesthesia: LOCAL

## 2018-07-25 MED ORDER — SODIUM CHLORIDE 0.9% FLUSH: 3.0000 mL | INTRAVENOUS | Status: DC | PRN

## 2018-07-25 MED ORDER — ONDANSETRON HCL 4 MG/2ML IJ SOLN: 4.0000 mg | Freq: Four times a day (QID) | INTRAMUSCULAR | Status: DC | PRN

## 2018-07-25 MED ORDER — HYDRALAZINE HCL 20 MG/ML IJ SOLN
INTRAMUSCULAR | Status: AC
  Filled 2018-07-25: qty 1

## 2018-07-25 MED ORDER — NITROGLYCERIN IN D5W 200-5 MCG/ML-% IV SOLN
INTRAVENOUS | Status: AC | PRN
  Administered 2018-07-25: 20 ug/min via INTRAVENOUS

## 2018-07-25 MED ORDER — ACETAMINOPHEN 325 MG PO TABS: 650.0000 mg | ORAL_TABLET | ORAL | Status: DC | PRN

## 2018-07-25 MED ORDER — LABETALOL HCL 5 MG/ML IV SOLN
INTRAVENOUS | Status: AC
  Filled 2018-07-25: qty 4

## 2018-07-25 MED ORDER — PANTOPRAZOLE SODIUM 40 MG PO TBEC: 40.0000 mg | DELAYED_RELEASE_TABLET | Freq: Every day | ORAL | Status: DC

## 2018-07-25 MED ORDER — LABETALOL HCL 5 MG/ML IV SOLN
INTRAVENOUS | Status: DC | PRN
  Administered 2018-07-25 (×4): 10 mg via INTRAVENOUS

## 2018-07-25 MED ORDER — HYDRALAZINE HCL 20 MG/ML IJ SOLN
INTRAMUSCULAR | Status: DC | PRN
  Administered 2018-07-25 (×2): 10 mg via INTRAVENOUS

## 2018-07-25 MED ORDER — NITROGLYCERIN 1 MG/10 ML FOR IR/CATH LAB
INTRA_ARTERIAL | Status: DC | PRN
  Administered 2018-07-25: 300 ug via INTRA_ARTERIAL
  Administered 2018-07-25: 200 ug via INTRA_ARTERIAL

## 2018-07-25 MED ORDER — MIDAZOLAM HCL 2 MG/2ML IJ SOLN
INTRAMUSCULAR | Status: AC
  Filled 2018-07-25: qty 2

## 2018-07-25 MED ORDER — MIDAZOLAM HCL 2 MG/2ML IJ SOLN
INTRAMUSCULAR | Status: DC | PRN
  Administered 2018-07-25: 2 mg via INTRAVENOUS

## 2018-07-25 MED ORDER — LIDOCAINE HCL (PF) 1 % IJ SOLN
INTRAMUSCULAR | Status: AC
  Filled 2018-07-25: qty 30

## 2018-07-25 MED ORDER — SODIUM CHLORIDE 0.9 % IV SOLN
INTRAVENOUS | Status: DC
  Administered 2018-07-25: 07:00:00 via INTRAVENOUS

## 2018-07-25 MED ORDER — NITROGLYCERIN IN D5W 200-5 MCG/ML-% IV SOLN: 0.0000 ug/min | INTRAVENOUS | Status: DC

## 2018-07-25 MED ORDER — NITROGLYCERIN IN D5W 200-5 MCG/ML-% IV SOLN
INTRAVENOUS | Status: AC
  Filled 2018-07-25: qty 250

## 2018-07-25 MED ORDER — SODIUM CHLORIDE 0.9 % IV SOLN: 250.0000 mL | INTRAVENOUS | Status: DC | PRN

## 2018-07-25 MED ORDER — LIDOCAINE HCL (PF) 1 % IJ SOLN
INTRAMUSCULAR | Status: DC | PRN
  Administered 2018-07-25: 15 mL via INTRADERMAL

## 2018-07-25 MED ORDER — CLONIDINE HCL 0.1 MG PO TABS
0.3000 mg | ORAL_TABLET | Freq: Three times a day (TID) | ORAL | Status: DC
  Administered 2018-07-25: 0.3 mg via ORAL
  Filled 2018-07-25: qty 3
  Filled 2018-07-25 (×2): qty 1.5

## 2018-07-25 MED ORDER — NITROGLYCERIN 1 MG/10 ML FOR IR/CATH LAB
INTRA_ARTERIAL | Status: AC
  Filled 2018-07-25: qty 10

## 2018-07-25 MED ORDER — SODIUM CHLORIDE 0.9% FLUSH: 3.0000 mL | Freq: Two times a day (BID) | INTRAVENOUS | Status: DC

## 2018-07-25 MED ORDER — CARVEDILOL 12.5 MG PO TABS
25.0000 mg | ORAL_TABLET | Freq: Two times a day (BID) | ORAL | Status: DC
  Administered 2018-07-25: 25 mg via ORAL
  Filled 2018-07-25: qty 2

## 2018-07-25 MED ORDER — FENTANYL CITRATE (PF) 100 MCG/2ML IJ SOLN
INTRAMUSCULAR | Status: AC
  Filled 2018-07-25: qty 2

## 2018-07-25 MED ORDER — OXYCODONE HCL 5 MG PO TABS: 5.0000 mg | ORAL_TABLET | ORAL | Status: DC | PRN

## 2018-07-25 MED ORDER — ASPIRIN 81 MG PO CHEW: 81.0000 mg | CHEWABLE_TABLET | ORAL | Status: DC

## 2018-07-25 MED ORDER — IOHEXOL 350 MG/ML SOLN
INTRAVENOUS | Status: DC | PRN
  Administered 2018-07-25: 80 mL via INTRA_ARTERIAL

## 2018-07-25 MED ORDER — HEPARIN (PORCINE) IN NACL 1000-0.9 UT/500ML-% IV SOLN
INTRAVENOUS | Status: DC | PRN
  Administered 2018-07-25 (×2): 500 mL

## 2018-07-25 MED ORDER — HEPARIN (PORCINE) IN NACL 1000-0.9 UT/500ML-% IV SOLN
INTRAVENOUS | Status: AC
  Filled 2018-07-25: qty 1000

## 2018-07-25 MED ORDER — HYDRALAZINE HCL 50 MG PO TABS
100.0000 mg | ORAL_TABLET | Freq: Three times a day (TID) | ORAL | Status: DC
  Administered 2018-07-25: 100 mg via ORAL
  Filled 2018-07-25: qty 2

## 2018-07-25 SURGICAL SUPPLY — 10 items
CATH INFINITI MULTIPACK ST 5F (CATHETERS) ×2 IMPLANT
CATH SWAN GANZ 7F STRAIGHT (CATHETERS) ×2 IMPLANT
KIT HEART LEFT (KITS) ×2 IMPLANT
PACK CARDIAC CATHETERIZATION (CUSTOM PROCEDURE TRAY) ×2 IMPLANT
SHEATH PINNACLE 5F 10CM (SHEATH) ×2 IMPLANT
SHEATH PINNACLE 7F 10CM (SHEATH) ×2 IMPLANT
SHEATH PROBE COVER 6X72 (BAG) ×2 IMPLANT
TRANSDUCER W/STOPCOCK (MISCELLANEOUS) ×2 IMPLANT
WIRE EMERALD 3MM-J .035X150CM (WIRE) ×2 IMPLANT
WIRE HI TORQ VERSACORE-J 145CM (WIRE) ×2 IMPLANT

## 2018-07-25 NOTE — Progress Notes (Signed)
Mr. Talton came to holding with sheaths sutured in due to his high blood pressure during his procedure. Cuff pressure were measuring over 702 systolic. Mr. Catalano's arterial line was transduced and systolic pressures remained in 160s-170's. Cuff pressures are markedly different than a-line pressures. Dr. Ellyn Hack ok to pull sheaths at 170s per holding area protocol so Mr. Riecke's sheaths were removed without difficulty. Sites are level 0 no bleeding or hematoma and the procedure was tolerated very well. Cuff pressure remains very elevated and calls made to pharmacy for patient's home medications to be given in recovery. We will continue to monitor his blood pressure while he's in the holding area and he will be transferred to short stay to continue his bedrest. Bedrest to begin at 11 AM.

## 2018-07-25 NOTE — Pre-Procedure Instructions (Signed)
Jeffrey [name]  07/25/2018      [id number] [address], [place] [address] Phone: [phone] Fax: [phone]    Your procedure is scheduled on Thurs. Nov. 7, 2019 from 8:00AM-12:38PM  Report to [facility] Admitting Entrance "A" at 6:00AM  Call this number if you have problems the morning of surgery:  [phone]   Remember:  Do not eat or drink after midnight on Nov. 6th    Take these medicines the morning of surgery with A SIP OF WATER: AmLODipine (NORVASC), Bictegravir-emtricitabine-tenofovir AF (BIKTARVY), Carvedilol (COREG),  CloNIDine (CATAPRES), HydrALAZINE (APRESOLINE), Isosorbide mononitrate (ISMO,MONOKET),  Omeprazole (PRILOSEC), and Lacosamide   If needed: Albuterol Inhaler, Ipratropium-Albuterol Inhaler- Bring with you the day of surgery,   HydrOXYzine (ATARAX/VISTARIL), and OxyCODONE (ROXICODONE)  Follow your surgeon's instructions on when to stop Aspirin and Heparin.  If no instructions were given by your surgeon then you will need to call the office to get those instructions.    As of today, stop taking all Other Aspirin Products, Vitamins, Fish oils, and Herbal medications. Also stop all NSAIDS i.e. Advil, Ibuprofen, Motrin, Aleve, Anaprox, Naproxen, BC, Goody Powders, and all Supplements.   Do not wear jewelry.  Do not wear lotions, powders, colognes, or deodorant.  Do not shave 48 hours prior to surgery.  Men may shave face.  Do not bring valuables to the hospital.  [facility] is not responsible for any belongings or valuables.  Contacts, dentures or bridgework may not be worn into surgery.  Leave your suitcase in the car.  After surgery it may be brought to your room.  For patients admitted to the hospital, discharge time will be determined by your treatment team.  Patients discharged the day of surgery will not be allowed to drive  home.   Special instructions:  [facility]- Preparing For Surgery  Before surgery, you can play an important role. Because skin is not sterile, your skin needs to be as free of germs as possible. You can reduce the number of germs on your skin by washing with CHG (chlorahexidine gluconate) Soap before surgery.  CHG is an antiseptic cleaner which kills germs and bonds with the skin to continue killing germs even after washing.    Oral Hygiene is also important to reduce your risk of infection.  Remember - BRUSH YOUR TEETH THE MORNING OF SURGERY WITH YOUR REGULAR TOOTHPASTE  Please do not use if you have an allergy to CHG or antibacterial soaps. If your skin becomes reddened/irritated stop using the CHG.  Do not shave (including legs and underarms) for at least 48 hours prior to first CHG shower. It is OK to shave your face.  Please follow these instructions carefully.   1. Shower the NIGHT BEFORE SURGERY and the MORNING OF SURGERY with CHG.   2. If you chose to wash your hair, wash your hair first as usual with your normal shampoo.  3. After you shampoo, rinse your hair and body thoroughly to remove the shampoo.  4. Use CHG as you would any other liquid soap. You can apply CHG directly to the skin and wash gently with a scrungie or a clean washcloth.   5. Apply the CHG Soap to your body ONLY FROM THE NECK DOWN.  Do not use on open wounds or open sores. Avoid contact with your eyes, ears, mouth and genitals (  private parts). Wash Face and genitals (private parts)  with your normal soap.  6. Wash thoroughly, paying special attention to the area where your surgery will be performed.  7. Thoroughly rinse your body with warm water from the neck down.  8. DO NOT shower/wash with your normal soap after using and rinsing off the CHG Soap.  9. Pat yourself dry with a CLEAN TOWEL.  10. Wear CLEAN PAJAMAS to bed the night before surgery, wear comfortable clothes the morning of surgery  11. Place  CLEAN SHEETS on your bed the night of your first shower and DO NOT SLEEP WITH PETS.  Day of Surgery:  Do not apply any deodorants/lotions.  Please wear clean clothes to the hospital/surgery center.   Remember to brush your teeth WITH YOUR REGULAR TOOTHPASTE.  Please read over the following fact sheets that you were given. Pain Booklet, Coughing and Deep Breathing and Surgical Site Infection Prevention

## 2018-07-25 NOTE — Progress Notes (Signed)
Dr Ellyn Hack in to talk to pt and family and is aware of Pt BP. Pt to Short stay at this time.  BP 146/47 after home meds given.

## 2018-07-25 NOTE — Interval H&P Note (Signed)
History and Physical Interval Note:  07/25/2018 7:36 AM  Jeffrey [name]  has presented today for surgery, with the diagnosis of Aortic Valve Insufficieny.  The various methods of treatment have been discussed with the patient and family. After consideration of risks, benefits and other options for treatment, the patient has consented to  Procedure(s): RIGHT/LEFT HEART CATH AND CORONARY ANGIOGRAPHY (N/A) as a surgical intervention .  The patient's history has been reviewed, patient examined, no change in status, stable for surgery.  I have reviewed the patient's chart and labs.  Questions were answered to the patient's satisfaction.     Glenetta Hew

## 2018-07-25 NOTE — Patient Outreach (Signed)
[facility]) Care Management  07/25/2018  CARLETON VANVALKENBURGH July 12, 1956 [id number]   Per Chart, client with Aortic Valve insufficiency presented to the hospital today for surgery.  Plan: RNCM will update hospital liaison. continue to follow.  [name], RN, MSN, [place] Coordinator Cell: [phone]

## 2018-07-25 NOTE — Discharge Instructions (Signed)

## 2018-07-26 ENCOUNTER — Other Ambulatory Visit: Payer: Self-pay

## 2018-07-26 ENCOUNTER — Encounter (HOSPITAL_COMMUNITY)
Admission: RE | Admit: 2018-07-26 | Discharge: 2018-07-26 | Disposition: A | Discharge status: Home / Self Care | Payer: Medicare Other | Source: Ambulatory Visit | Attending: Thoracic Surgery (Cardiothoracic Vascular Surgery) | Admitting: Thoracic Surgery (Cardiothoracic Vascular Surgery)

## 2018-07-26 ENCOUNTER — Ambulatory Visit (HOSPITAL_COMMUNITY)
Admission: RE | Admit: 2018-07-26 | Discharge: 2018-07-26 | Disposition: A | Discharge status: Home / Self Care | Payer: Medicare Other | Source: Ambulatory Visit | Attending: Thoracic Surgery (Cardiothoracic Vascular Surgery) | Admitting: Thoracic Surgery (Cardiothoracic Vascular Surgery)

## 2018-07-26 ENCOUNTER — Encounter (HOSPITAL_COMMUNITY): Payer: Self-pay | Admitting: Cardiology

## 2018-07-26 DIAGNOSIS — Z01818 Encounter for other preprocedural examination: Secondary | ICD-10-CM

## 2018-07-26 DIAGNOSIS — N186 End stage renal disease: Secondary | ICD-10-CM | POA: Diagnosis not present

## 2018-07-26 DIAGNOSIS — J90 Pleural effusion, not elsewhere classified: Secondary | ICD-10-CM | POA: Insufficient documentation

## 2018-07-26 DIAGNOSIS — Z992 Dependence on renal dialysis: Secondary | ICD-10-CM | POA: Diagnosis not present

## 2018-07-26 DIAGNOSIS — I351 Nonrheumatic aortic (valve) insufficiency: Secondary | ICD-10-CM

## 2018-07-26 DIAGNOSIS — I350 Nonrheumatic aortic (valve) stenosis: Secondary | ICD-10-CM

## 2018-07-26 DIAGNOSIS — D631 Anemia in chronic kidney disease: Secondary | ICD-10-CM | POA: Diagnosis not present

## 2018-07-26 DIAGNOSIS — R0989 Other specified symptoms and signs involving the circulatory and respiratory systems: Secondary | ICD-10-CM

## 2018-07-26 LAB — CBC
HCT: 29.1 % — ABNORMAL LOW (ref 39.0–52.0)
Hemoglobin: 8.7 g/dL — ABNORMAL LOW (ref 13.0–17.0)
MCH: 29.2 pg (ref 26.0–34.0)
MCHC: 29.9 g/dL — ABNORMAL LOW (ref 30.0–36.0)
MCV: 97.7 fL (ref 80.0–100.0)
NRBC: 0 % (ref 0.0–0.2)
PLATELETS: 151 10*3/uL (ref 150–400)
RBC: 2.98 MIL/uL — AB (ref 4.22–5.81)
RDW: 17.6 % — AB (ref 11.5–15.5)
WBC: 10.3 10*3/uL (ref 4.0–10.5)

## 2018-07-26 LAB — COMPREHENSIVE METABOLIC PANEL
ALT: 6 U/L (ref 0–44)
AST: 12 U/L — AB (ref 15–41)
Albumin: 3 g/dL — ABNORMAL LOW (ref 3.5–5.0)
Alkaline Phosphatase: 69 U/L (ref 38–126)
Anion gap: 13 (ref 5–15)
BUN: 58 mg/dL — AB (ref 8–23)
CHLORIDE: 102 mmol/L (ref 98–111)
CO2: 22 mmol/L (ref 22–32)
Calcium: 8.8 mg/dL — ABNORMAL LOW (ref 8.9–10.3)
Creatinine, Ser: 11.44 mg/dL — ABNORMAL HIGH (ref 0.61–1.24)
GFR calc Af Amer: 5 mL/min — ABNORMAL LOW (ref >60)
GFR, EST NON AFRICAN AMERICAN: 4 mL/min — AB (ref >60)
Glucose, Bld: 105 mg/dL — ABNORMAL HIGH (ref 70–99)
POTASSIUM: 4.2 mmol/L (ref 3.5–5.1)
SODIUM: 137 mmol/L (ref 135–145)
Total Bilirubin: 1 mg/dL (ref 0.3–1.2)
Total Protein: 6.3 g/dL — ABNORMAL LOW (ref 6.5–8.1)

## 2018-07-26 LAB — PULMONARY FUNCTION TEST
DL/VA % PRED: 101 %
DL/VA: 4.79 ml/min/mmHg/L
DLCO cor % pred: 36 %
DLCO cor: 12.67 ml/min/mmHg
DLCO unc % pred: 28 %
DLCO unc: 10.02 ml/min/mmHg
FEF 25-75 Pre: 0.5 L/sec
FEF2575-%PRED-PRE: 17 %
FEV1-%Pred-Pre: 29 %
FEV1-PRE: 0.95 L
FEV1FVC-%Pred-Pre: 83 %
FEV6-%Pred-Pre: 36 %
FEV6-Pre: 1.47 L
FEV6FVC-%Pred-Pre: 103 %
FVC-%PRED-PRE: 34 %
FVC-PRE: 1.47 L
PRE FEV6/FVC RATIO: 100 %
Pre FEV1/FVC ratio: 65 %

## 2018-07-26 LAB — HEMOGLOBIN A1C
Hgb A1c MFr Bld: 4.2 % — ABNORMAL LOW (ref 4.8–5.6)
Mean Plasma Glucose: 73.84 mg/dL

## 2018-07-26 LAB — BLOOD GAS, ARTERIAL
ACID-BASE DEFICIT: 1.2 mmol/L (ref 0.0–2.0)
Bicarbonate: 22.9 mmol/L (ref 20.0–28.0)
DRAWN BY: 42180
FIO2: 21
O2 SAT: 97 %
PATIENT TEMPERATURE: 98.6
pCO2 arterial: 37.8 mmHg (ref 32.0–48.0)
pH, Arterial: 7.4 (ref 7.350–7.450)
pO2, Arterial: 95.5 mmHg (ref 83.0–108.0)

## 2018-07-26 LAB — APTT: aPTT: 37 seconds — ABNORMAL HIGH (ref 24–36)

## 2018-07-26 MED ORDER — ALBUTEROL SULFATE (2.5 MG/3ML) 0.083% IN NEBU
2.5000 mg | INHALATION_SOLUTION | Freq: Once | RESPIRATORY_TRACT | Status: AC
  Administered 2018-07-26: 2.5 mg via RESPIRATORY_TRACT

## 2018-07-26 NOTE — Progress Notes (Signed)
PCP - Dr. Kermit Balo  Cardiologist - Dr. Roxan Hockey  Chest x-ray - 07/26/18  EKG - 07/09/18 (E)  Stress Test - Denies  ECHO - 07/14/18  Cardiac Cath - 07/25/18   AICD- na PM- na LOOP- na  Sleep Study - Yes- Positive CPAP - Not at the moment  LABS- 07/26/18: CBC, CMP, PTT, ABG 07/04/18: PCR- Neg 07/28/18: T/S- pt had a blood transfusion on 05/07/18  ASA- Continue  HA1C- 07/26/18  Anesthesia- Yes-medical history  Pt denies having chest pain, sob, or fever at this time. All instructions explained to the pt, with a verbal understanding of the material. Pt agrees to go over the instructions while at home for a better understanding. The opportunity to ask questions was provided.

## 2018-07-26 NOTE — Patient Outreach (Signed)
[facility]) Care Management  07/26/2018  Jeffrey [name] 1955/10/29 [id number]  Referral received 07/05/18. Client discharged from hospital post observation stay (10/14-10/15).   RNCM has made numerous attempt to engage client in the past. RNCM called this morning and spoke with client. Two patient identifiers confirmed. Client reports he is having heart surgery this week.    RNCM discussed purpose of the call. RNCM reviewed Care Management services available. Client is declining services. RNCM offered support and encouraged client to call as needed.  Plan: RNCM will not open case. RNCM will send out information about [facility] Care Management. Update primary care.  [name], RN, MSN, [place] Coordinator Cell: [phone]

## 2018-07-27 MED ORDER — PLASMA-LYTE 148 IV SOLN
INTRAVENOUS | Status: DC
  Filled 2018-07-27: qty 2.5

## 2018-07-27 MED ORDER — NOREPINEPHRINE 4 MG/250ML-% IV SOLN
0.0000 ug/min | INTRAVENOUS | Status: DC
  Filled 2018-07-27: qty 250

## 2018-07-27 MED ORDER — PHENYLEPHRINE HCL-NACL 20-0.9 MG/250ML-% IV SOLN
30.0000 ug/min | INTRAVENOUS | Status: DC
  Filled 2018-07-27: qty 250

## 2018-07-27 MED ORDER — TRANEXAMIC ACID (OHS) PUMP PRIME SOLUTION
2.0000 mg/kg | INTRAVENOUS | Status: DC
  Filled 2018-07-27: qty 1.55

## 2018-07-27 MED ORDER — MAGNESIUM SULFATE 50 % IJ SOLN
40.0000 meq | INTRAMUSCULAR | Status: DC
  Filled 2018-07-27: qty 9.85

## 2018-07-27 MED ORDER — NITROGLYCERIN IN D5W 200-5 MCG/ML-% IV SOLN
2.0000 ug/min | INTRAVENOUS | Status: DC
  Filled 2018-07-27: qty 250

## 2018-07-27 MED ORDER — POTASSIUM CHLORIDE 2 MEQ/ML IV SOLN
80.0000 meq | INTRAVENOUS | Status: DC
  Filled 2018-07-27: qty 40

## 2018-07-27 MED ORDER — INSULIN REGULAR(HUMAN) IN NACL 100-0.9 UT/100ML-% IV SOLN
INTRAVENOUS | Status: AC
  Administered 2018-07-28: .9 [IU]/h via INTRAVENOUS
  Filled 2018-07-27: qty 100

## 2018-07-27 MED ORDER — DEXMEDETOMIDINE HCL IN NACL 400 MCG/100ML IV SOLN
0.1000 ug/kg/h | INTRAVENOUS | Status: DC
  Filled 2018-07-27: qty 100

## 2018-07-27 MED ORDER — MILRINONE LACTATE IN DEXTROSE 20-5 MG/100ML-% IV SOLN
0.3000 ug/kg/min | INTRAVENOUS | Status: DC
  Filled 2018-07-27: qty 100

## 2018-07-27 MED ORDER — SODIUM CHLORIDE 0.9 % IV SOLN
INTRAVENOUS | Status: DC
  Filled 2018-07-27: qty 30

## 2018-07-27 MED ORDER — TRANEXAMIC ACID (OHS) BOLUS VIA INFUSION
15.0000 mg/kg | INTRAVENOUS | Status: AC
  Administered 2018-07-28: 1159.5 mg via INTRAVENOUS
  Filled 2018-07-27: qty 1160

## 2018-07-27 MED ORDER — LEVOFLOXACIN IN D5W 500 MG/100ML IV SOLN
500.0000 mg | INTRAVENOUS | Status: AC
  Administered 2018-07-28: 500 mg via INTRAVENOUS
  Filled 2018-07-27: qty 100

## 2018-07-27 MED ORDER — EPINEPHRINE PF 1 MG/ML IJ SOLN
0.0000 ug/min | INTRAVENOUS | Status: DC
  Filled 2018-07-27: qty 4

## 2018-07-27 MED ORDER — DOPAMINE-DEXTROSE 3.2-5 MG/ML-% IV SOLN
0.0000 ug/kg/min | INTRAVENOUS | Status: DC
  Filled 2018-07-27: qty 250

## 2018-07-27 MED ORDER — VANCOMYCIN HCL 10 G IV SOLR
1250.0000 mg | INTRAVENOUS | Status: AC
  Administered 2018-07-28: 1250 mg via INTRAVENOUS
  Filled 2018-07-27: qty 1250

## 2018-07-27 MED ORDER — TRANEXAMIC ACID 1000 MG/10ML IV SOLN
1.5000 mg/kg/h | INTRAVENOUS | Status: AC
  Administered 2018-07-28: 1.5 mg/kg/h via INTRAVENOUS
  Filled 2018-07-27: qty 25

## 2018-07-27 NOTE — Progress Notes (Addendum)
Anesthesia Chart Review:  Case:  [id number] Date/Time:  07/28/18 0745   Procedures:      AORTIC VALVE REPLACEMENT (AVR) (N/A Chest)     TRANSESOPHAGEAL ECHOCARDIOGRAM (TEE) (N/A )   Anesthesia type:  General   Pre-op diagnosis:  AI   Location:  [facility] OR ROOM 17 / [place] [facility]   Surgeon:  [name], MD      DISCUSSION: Patient is a 62 year old male scheduled for the above procedure.   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 07/04/18), HIV, hepatitis B carrier, MSSA 07/2016 (TEE: severe AI, no definite vegetation; patient initially declined AVR 02/2017), HTN, anemia (last transfusion 05/07/18), thrombocytopenia, dyspnea, OSA (CPAP ordered, but not currently using), asthma, seizures (first 05/2011 in the setting of acute renal failure, recurrent seizures June/July 2019 and started on Vimpat, increased after 07/10/18 seizure), hyperparathyroidism. Normal TSH 07/10/18. - Admission 07/09/18-07/19/18 for acute uremic encephalopathy with seizure in the setting of missed hemodialysis. He had another seizure 07/10/18 requiring intubation. Vimpat dose increased and loaded with Keppra (although long term Keppra avoided due to thrombocytopenia). 07/10/18/ MRI showed small acute white matter infarct in the anterior left fontal lobe and small late subacute to early chronic white matter lacune in the anterior right corona, and chronic small lacunar infarct in the left thalamus and microhemorrhage in the left temporal lobe but new since 03/2018. 07/11/18 overnight EEG suggestive of encephalopathy. 07/14/18 MRA head was negative. Neurology felt finding of small CVA to not be too clinically significant and more of an incidental finding with unclear etiology ("could be due to small vessel disease vs. Cardioembolic source given severe AVR, LAE, non-compaction and hx of endocarditis"). Dr. Roxan Hockey saw Mr. Hun during that admission, last 07/15/18,  and recommended delaying AVR for 1-2 weeks while patient recovered from events.  H/H results routed to Dr. Roxan Hockey and [place]. He will get ISTAT4 and T&S on arrival the day of surgery. He was unable to provide a UA specimen due to ESRD. Anesthesiologist to evaluate on the day of surgery.   VS: BP 115/64   Pulse (!) 57   Temp 36.8 C   Resp 20   Ht 6' (1.829 m)   Wt 77.3 kg   SpO2 100%   BMI 23.12 kg/m     PROVIDERS: Velna Ochs, MD is PCP ([place]) Bobby Rumpf, MD is ID Glenetta Hew, MD is cardiologist Pearson Grippe, MD is nephrologist [facility]) Krista Blue, is neurologist   LABS: Preoperative labs noted. 07/25/18 PT/INR WNL. He is anemic, but results stable.  (all labs ordered are listed, but only abnormal results are displayed)  Labs Reviewed  CBC - Abnormal; Notable for the following components:      Result Value   RBC 2.98 (*)    Hemoglobin 8.7 (*)    HCT 29.1 (*)    MCHC 29.9 (*)    RDW 17.6 (*)    All other components within normal limits  COMPREHENSIVE METABOLIC PANEL - Abnormal; Notable for the following components:   Glucose, Bld 105 (*)    BUN 58 (*)    Creatinine, Ser 11.44 (*)    Calcium 8.8 (*)    Total Protein 6.3 (*)    Albumin 3.0 (*)    AST 12 (*)    GFR calc non Af Amer 4 (*)    GFR calc Af Amer 5 (*)    All other components  within normal limits  HEMOGLOBIN A1C - Abnormal; Notable for the following components:   Hgb A1c MFr Bld 4.2 (*)    All other components within normal limits  APTT - Abnormal; Notable for the following components:   aPTT 37 (*)    All other components within normal limits  BLOOD GAS, ARTERIAL   CBC Latest Ref Rng & Units 07/26/2018 07/18/2018 07/15/2018  WBC 4.0 - 10.5 K/uL 10.3 6.3 6.4  Hemoglobin 13.0 - 17.0 g/dL 8.7(L) 8.9(L) 8.6(L)  Hematocrit 39.0 - 52.0 % 29.1(L) 29.1(L) 29.3(L)  Platelets 150 - 400 K/uL 151 185 145(L)    PFTs 07/26/18: FVC 1.47 (34%), FEV1 0.95  (29%), DLCO unc 10.02 (28%), DLCO cor 12.67 (36%).   IMAGES: CXR 07/26/18: IMPRESSION: 1. Persistent moderate to large left pleural effusion with pulmonary vascular congestion.  Overnight EEG 07/11/18: Clinical interpretation: This 12 hours of intensive EEG monitoring with simultaneous video monitoring document did not record any clinical subclinical seizures.  Background activities were abnormal due to reactive background activity slowing  suggestive of encephalopathy of nonspecific etiologies.  Clinical correlation is advised.  EEG 06/13/18: CONCLUSION: This is an abnormal awake EEG.  There is electrodiagnostic evidence of generalized slowing, indicating bihemisphere malfunction, common etiology are metabolic toxic.  There is no evidence of epileptiform discharge.   EKG: 07/09/18: NSR, first degree AV block. Baseline wanderer.   CV: Cardiac cath 07/25/18:  Hemodynamic findings consistent with mild pulmonary hypertension.  LV end diastolic pressure is moderately elevated.  Normal RHC pressures with High CO/CI  Dist LM lesion is 40% stenosed.  Prox LAD lesion is 40% stenosed with 30% stenosed side branch in Ost 2nd Diag.  SUMMARY  Moderate single-vessel disease with extensive calcification.  Severe systemic hypertension -unresponsive to medications  Relatively normal right heart cath pressures.  High output by both Thermodilution and Fick.  Echo 07/14/18: Study Conclusions - Left ventricle: The cavity size was moderately dilated. There was   severe concentric hypertrophy. The trabeculation was abnormal,   with prominent apical trabeculations concerning for LV   noncompaction. Systolic function was normal. The estimated   ejection fraction was in the range of 55% to 60%. Wall motion was   normal; there were no regional wall motion abnormalities.   Features are consistent with a pseudonormal left ventricular   filling pattern, with concomitant abnormal relaxation and    increased filling pressure (grade 2 diastolic dysfunction). - Aortic valve: Trileaflet; mildly thickened, mildly calcified   leaflets. There was moderate to severe regurgitation. - Left atrium: The atrium was severely dilated. - Right ventricle: The cavity size was mildly dilated. Wall   thickness was normal. Systolic function was normal. - Atrial septum: No defect or patent foramen ovale was identified. - Pulmonary arteries: Systolic pressure was severely increased. PA   peak pressure: 64 mm Hg (S). - Pericardium, extracardiac: There was a large left pleural   effusion. Impressions: - Severe LVH with apical trabeculation. Moderate-severe aortic   regurgitation. Large left pleural effision. Cardiac MRI could   assess both aortic regurgitation and LV noncompaction.  Carotid U/S 07/14/18: Summary: - Right Carotid: Velocities in the right ICA are consistent with a 1-39% stenosis. - Left Carotid: Velocities in the left ICA are consistent with a 1-39% stenosis. Vertebrals: Bilateral vertebral arteries demonstrate antegrade flow.   Past Medical History:  Diagnosis Date  . Anemia   . Anxiety   . Aortic valve stenosis   . Arthritis   . Asthma    per  pt hx  . Dyspnea   . End stage renal disease on home HD 07/10/2011   Started HD in September 2012 at [facility] with a tunneled HD catheter, now on home HD with NxtStage. Dialyzing through AVF L lower arm with buttonhole technique as of mid 2014. His brother does the HD treatments at home.  They are roommates for 23 years.  The brother works 3rd shift and gets off about 8am and then puts Mr Craighead on HD in the morning after getting home. Most of the time he does HD about 4 times a week, for about 4 hours per treatment. Cause of ESRD was HTN according to patient. He says he let his health go and ending up with complications, and that he didn't like seeing doctors in those days.  He says he was diagnosed with severe HTN when he lived in [place] in his  75's.   . Hepatitis    Hepatitis B  . Hepatitis B carrier ([place])   . HIV infection ([place])   . Hypertension   . Hyperthyroidism    normal now  . Pneumonia several yrs ago  . Seizure ([place])   . Seizures ([place]) 06/02/2011   had a mild one in Sept. 2019  . Sleep apnea    to start Cpap soon  . Thrombocytopenia ([place])     Past Surgical History:  Procedure Laterality Date  . AV FISTULA PLACEMENT  06/02/11   Left radiocephalic AVF  . BIOPSY  02/17/2018   Procedure: BIOPSY;  Surgeon: Milus Banister, MD;  Location: [facility] ENDOSCOPY;  Service: Endoscopy;;  . COLONOSCOPY    . COLONOSCOPY WITH PROPOFOL N/A 01/27/2018   Procedure: COLONOSCOPY WITH PROPOFOL;  Surgeon: [name], MD;  Location: [facility] ENDOSCOPY;  Service: Gastroenterology;  Laterality: N/A;  . CORONARY ANGIOPLASTY    . ERCP  03/21/2018   AT [place]  . ESOPHAGOGASTRODUODENOSCOPY N/A 02/17/2018   Procedure: ESOPHAGOGASTRODUODENOSCOPY (EGD);  Surgeon: Milus Banister, MD;  Location: [facility];  Service: Endoscopy;  Laterality: N/A;  . ESOPHAGOGASTRODUODENOSCOPY (EGD) WITH PROPOFOL N/A 01/27/2018   Procedure: ESOPHAGOGASTRODUODENOSCOPY (EGD) WITH PROPOFOL;  Surgeon: [name], MD;  Location: [facility] ENDOSCOPY;  Service: Gastroenterology;  Laterality: N/A;  . ESOPHAGOGASTRODUODENOSCOPY (EGD) WITH PROPOFOL N/A 04/03/2018   Procedure: ESOPHAGOGASTRODUODENOSCOPY (EGD) WITH PROPOFOL;  Surgeon: Clarene Essex, MD;  Location: [place];  Service: Endoscopy;  Laterality: N/A;  . EUS N/A 02/17/2018   Procedure: UPPER ENDOSCOPIC ULTRASOUND (EUS) RADIAL;  Surgeon: Milus Banister, MD;  Location: [facility] ENDOSCOPY;  Service: Endoscopy;  Laterality: N/A;  . fistulaogram     x 2 last 2 years  . IR DIALY SHUNT INTRO NEEDLE/INTRACATH INITIAL W/IMG LEFT Left 04/20/2018  . IR FLUORO GUIDE CV LINE RIGHT  09/16/2017  . IR US GUIDE VASC ACCESS RIGHT  09/16/2017  . IRRIGATION AND DEBRIDEMENT KNEE Left 09/15/2017   Procedure: IRRIGATION AND DEBRIDEMENT KNEE;  arthroscopic clean out;  Surgeon: [name], MD;  Location: [facility] ORS;  Service: Orthopedics;  Laterality: Left;  . OTHER SURGICAL HISTORY     removal temporary HD catheter   . REVISON OF ARTERIOVENOUS FISTULA Left 10/10/2015   Procedure: REVISON OF LEFT RADIOCEPHALIC ARTERIOVENOUS FISTULA;  Surgeon: Angelia Mould, MD;  Location: [place];  Service: Vascular;  Laterality: Left;  . REVISON OF ARTERIOVENOUS FISTULA Left 02/07/2016   Procedure: REPAIR OF PSEUDO-ANEUREYSM OF LEFT ARM  ARTERIOVENOUS FISTULA;  Surgeon: Angelia Mould, MD;  Location: [place];  Service: Vascular;  Laterality: Left;  .  REVISON OF ARTERIOVENOUS FISTULA Left 01/27/3266   Procedure: PLICATION OF LEFT ARM RADIOCEPHALIC ARTERIOVENOUS FISTULA PSEUDOANEURYSM;  Surgeon: Angelia Mould, MD;  Location: [place];  Service: Vascular;  Laterality: Left;  . REVISON OF ARTERIOVENOUS FISTULA Left 07/04/2018   Procedure: REVISION OF ARTERIOVENOUS RADIOCEPHALIC FISTULA;  Surgeon: Angelia Mould, MD;  Location: [place];  Service: Vascular;  Laterality: Left;  . RIGHT/LEFT HEART CATH AND CORONARY ANGIOGRAPHY N/A 02/17/2017   Procedure: Right/Left Heart Cath and Coronary Angiography;  Surgeon: Sherren Mocha, MD;  Location: [place] CV LAB;  Service: Cardiovascular;  Laterality: N/A;  . RIGHT/LEFT HEART CATH AND CORONARY ANGIOGRAPHY N/A 07/25/2018   Procedure: RIGHT/LEFT HEART CATH AND CORONARY ANGIOGRAPHY;  Surgeon: Leonie Man, MD;  Location: [place] CV LAB;  Service: Cardiovascular;  Laterality: N/A;  . TEE WITHOUT CARDIOVERSION N/A 09/11/2016   Procedure: TRANSESOPHAGEAL ECHOCARDIOGRAM (TEE);  Surgeon: Dorothy Spark, MD;  Location: [place];  Service: Cardiovascular;  Laterality: N/A;  . TEE WITHOUT CARDIOVERSION N/A 05/05/2018   Procedure: TRANSESOPHAGEAL ECHOCARDIOGRAM (TEE);  Surgeon: Adrian Prows, MD;  Location: [facility] ENDOSCOPY;  Service: Cardiovascular;  Laterality: N/A;  Prefer after 3:30 PM     MEDICATIONS: . albuterol (PROVENTIL) (2.5 MG/3ML) 0.083% nebulizer solution  . ALPRAZolam (XANAX) 0.25 MG tablet  . amLODipine (NORVASC) 10 MG tablet  . aspirin EC 81 MG EC tablet  . bictegravir-emtricitabine-tenofovir AF (BIKTARVY) 50-200-25 MG TABS tablet  . calcium acetate (PHOSLO) 667 MG capsule  . carvedilol (COREG) 25 MG tablet  . cloNIDine (CATAPRES) 0.3 MG tablet  . cyclobenzaprine (FLEXERIL) 10 MG tablet  . epoetin alfa (EPOGEN,PROCRIT) 2000 UNIT/ML injection  . ethyl chloride spray  . heparin 1000 UNIT/ML injection  . hydrALAZINE (APRESOLINE) 100 MG tablet  . hydrOXYzine (ATARAX/VISTARIL) 25 MG tablet  . Ipratropium-Albuterol (COMBIVENT IN)  . isosorbide mononitrate (ISMO,MONOKET) 20 MG tablet  . lacosamide 150 MG TABS  . multivitamin (RENA-VIT) TABS tablet  . omeprazole (PRILOSEC) 20 MG capsule  . oxyCODONE (ROXICODONE) 5 MG immediate release tablet  . traZODone (DESYREL) 50 MG tablet  . VOLTAREN 1 % GEL   No current facility-administered medications for this encounter.    Marland Kitchen etomidate (AMIDATE) injection  . rocuronium bromide 10 mg/mL (PF) syringe    George Hugh [facility] Short Stay Center/Anesthesiology Phone [phone] 07/27/2018 12:23 PM

## 2018-07-27 NOTE — Anesthesia Preprocedure Evaluation (Addendum)
Anesthesia Evaluation  Patient identified by MRN, date of birth, ID band Patient awake    Reviewed: Allergy & Precautions, H&P , NPO status , Patient's Chart, lab work & pertinent test results  Airway Mallampati: II   Neck ROM: full    Dental  (+) Dental Advisory Given, Edentulous Lower, Edentulous Upper   Pulmonary shortness of breath, asthma , sleep apnea , COPD, former smoker,    breath sounds clear to auscultation       Cardiovascular hypertension, + Peripheral Vascular Disease  + Valvular Problems/Murmurs AI  Rhythm:regular Rate:Normal     Neuro/Psych Seizures -,  PSYCHIATRIC DISORDERS Anxiety CVA    GI/Hepatic (+) Hepatitis -, B  Endo/Other  Hyperthyroidism   Renal/GU ESRF and DialysisRenal disease     Musculoskeletal  (+) Arthritis ,   Abdominal   Peds  Hematology  (+) Blood dyscrasia, anemia , HIV,   Anesthesia Other Findings   Reproductive/Obstetrics                           Anesthesia Physical Anesthesia Plan  ASA: III  Anesthesia Plan: General   Post-op Pain Management:    Induction: Intravenous  PONV Risk Score and Plan: 2 and Ondansetron, Dexamethasone, Midazolam and Treatment may vary due to age or medical condition  Airway Management Planned: Oral ETT  Additional Equipment: Arterial line, PA Cath, CVP, TEE and Ultrasound Guidance Line Placement  Intra-op Plan: Utilization Of Total Body Hypothermia per surgeon request  Post-operative Plan: Post-operative intubation/ventilation  Informed Consent: I have reviewed the patients History and Physical, chart, labs and discussed the procedure including the risks, benefits and alternatives for the proposed anesthesia with the patient or authorized representative who has indicated his/her understanding and acceptance.     Plan Discussed with: CRNA, Anesthesiologist and Surgeon  Anesthesia Plan Comments: (PAT note written  07/27/2018 by Myra Gianotti, PA-C. )       Anesthesia Quick Evaluation

## 2018-07-28 ENCOUNTER — Inpatient Hospital Stay (HOSPITAL_COMMUNITY): Payer: Medicare Other

## 2018-07-28 ENCOUNTER — Inpatient Hospital Stay (HOSPITAL_COMMUNITY): Payer: Medicare Other | Admitting: Vascular Surgery

## 2018-07-28 ENCOUNTER — Other Ambulatory Visit: Payer: Self-pay

## 2018-07-28 ENCOUNTER — Encounter (HOSPITAL_COMMUNITY): Payer: Self-pay | Admitting: *Deleted

## 2018-07-28 ENCOUNTER — Encounter (HOSPITAL_COMMUNITY)
Admission: RE | Disposition: A | Discharge status: Skilled Nursing Facility | Payer: Self-pay | Source: Home / Self Care | Attending: Thoracic Surgery (Cardiothoracic Vascular Surgery)

## 2018-07-28 ENCOUNTER — Inpatient Hospital Stay (HOSPITAL_COMMUNITY)
Admission: RE | Admit: 2018-07-28 | Discharge: 2018-08-20 | DRG: 219 | Disposition: A | Discharge status: Skilled Nursing Facility | Payer: Medicare Other | Attending: Thoracic Surgery (Cardiothoracic Vascular Surgery) | Admitting: Thoracic Surgery (Cardiothoracic Vascular Surgery)

## 2018-07-28 ENCOUNTER — Other Ambulatory Visit: Payer: Self-pay | Admitting: Infectious Diseases

## 2018-07-28 DIAGNOSIS — N2581 Secondary hyperparathyroidism of renal origin: Secondary | ICD-10-CM | POA: Diagnosis present

## 2018-07-28 DIAGNOSIS — I313 Pericardial effusion (noninflammatory): Secondary | ICD-10-CM | POA: Diagnosis present

## 2018-07-28 DIAGNOSIS — Z952 Presence of prosthetic heart valve: Secondary | ICD-10-CM

## 2018-07-28 DIAGNOSIS — R41841 Cognitive communication deficit: Secondary | ICD-10-CM | POA: Diagnosis not present

## 2018-07-28 DIAGNOSIS — B181 Chronic viral hepatitis B without delta-agent: Secondary | ICD-10-CM | POA: Diagnosis present

## 2018-07-28 DIAGNOSIS — Z7982 Long term (current) use of aspirin: Secondary | ICD-10-CM

## 2018-07-28 DIAGNOSIS — F419 Anxiety disorder, unspecified: Secondary | ICD-10-CM

## 2018-07-28 DIAGNOSIS — G4730 Sleep apnea, unspecified: Secondary | ICD-10-CM | POA: Diagnosis present

## 2018-07-28 DIAGNOSIS — I272 Pulmonary hypertension, unspecified: Secondary | ICD-10-CM | POA: Diagnosis present

## 2018-07-28 DIAGNOSIS — J9811 Atelectasis: Secondary | ICD-10-CM | POA: Diagnosis not present

## 2018-07-28 DIAGNOSIS — E039 Hypothyroidism, unspecified: Secondary | ICD-10-CM | POA: Diagnosis not present

## 2018-07-28 DIAGNOSIS — Z79891 Long term (current) use of opiate analgesic: Secondary | ICD-10-CM

## 2018-07-28 DIAGNOSIS — B1910 Unspecified viral hepatitis B without hepatic coma: Secondary | ICD-10-CM | POA: Diagnosis not present

## 2018-07-28 DIAGNOSIS — N186 End stage renal disease: Secondary | ICD-10-CM | POA: Diagnosis present

## 2018-07-28 DIAGNOSIS — Z888 Allergy status to other drugs, medicaments and biological substances status: Secondary | ICD-10-CM

## 2018-07-28 DIAGNOSIS — D649 Anemia, unspecified: Secondary | ICD-10-CM | POA: Diagnosis not present

## 2018-07-28 DIAGNOSIS — Z992 Dependence on renal dialysis: Secondary | ICD-10-CM

## 2018-07-28 DIAGNOSIS — D696 Thrombocytopenia, unspecified: Secondary | ICD-10-CM | POA: Diagnosis present

## 2018-07-28 DIAGNOSIS — E1122 Type 2 diabetes mellitus with diabetic chronic kidney disease: Secondary | ICD-10-CM | POA: Diagnosis present

## 2018-07-28 DIAGNOSIS — E875 Hyperkalemia: Secondary | ICD-10-CM | POA: Diagnosis present

## 2018-07-28 DIAGNOSIS — I2510 Atherosclerotic heart disease of native coronary artery without angina pectoris: Secondary | ICD-10-CM | POA: Diagnosis present

## 2018-07-28 DIAGNOSIS — I358 Other nonrheumatic aortic valve disorders: Secondary | ICD-10-CM | POA: Diagnosis not present

## 2018-07-28 DIAGNOSIS — I352 Nonrheumatic aortic (valve) stenosis with insufficiency: Secondary | ICD-10-CM | POA: Diagnosis present

## 2018-07-28 DIAGNOSIS — R001 Bradycardia, unspecified: Secondary | ICD-10-CM | POA: Diagnosis not present

## 2018-07-28 DIAGNOSIS — I25110 Atherosclerotic heart disease of native coronary artery with unstable angina pectoris: Secondary | ICD-10-CM | POA: Diagnosis not present

## 2018-07-28 DIAGNOSIS — E8889 Other specified metabolic disorders: Secondary | ICD-10-CM | POA: Diagnosis present

## 2018-07-28 DIAGNOSIS — I120 Hypertensive chronic kidney disease with stage 5 chronic kidney disease or end stage renal disease: Secondary | ICD-10-CM | POA: Diagnosis present

## 2018-07-28 DIAGNOSIS — Z87891 Personal history of nicotine dependence: Secondary | ICD-10-CM

## 2018-07-28 DIAGNOSIS — D62 Acute posthemorrhagic anemia: Secondary | ICD-10-CM | POA: Diagnosis not present

## 2018-07-28 DIAGNOSIS — I97190 Other postprocedural cardiac functional disturbances following cardiac surgery: Secondary | ICD-10-CM | POA: Diagnosis not present

## 2018-07-28 DIAGNOSIS — I4891 Unspecified atrial fibrillation: Secondary | ICD-10-CM | POA: Diagnosis not present

## 2018-07-28 DIAGNOSIS — J810 Acute pulmonary edema: Secondary | ICD-10-CM | POA: Diagnosis not present

## 2018-07-28 DIAGNOSIS — I351 Nonrheumatic aortic (valve) insufficiency: Secondary | ICD-10-CM | POA: Diagnosis present

## 2018-07-28 DIAGNOSIS — R2689 Other abnormalities of gait and mobility: Secondary | ICD-10-CM | POA: Diagnosis not present

## 2018-07-28 DIAGNOSIS — I10 Essential (primary) hypertension: Secondary | ICD-10-CM | POA: Diagnosis not present

## 2018-07-28 DIAGNOSIS — G40909 Epilepsy, unspecified, not intractable, without status epilepticus: Secondary | ICD-10-CM | POA: Diagnosis present

## 2018-07-28 DIAGNOSIS — Z954 Presence of other heart-valve replacement: Secondary | ICD-10-CM | POA: Diagnosis not present

## 2018-07-28 DIAGNOSIS — M1990 Unspecified osteoarthritis, unspecified site: Secondary | ICD-10-CM | POA: Diagnosis not present

## 2018-07-28 DIAGNOSIS — Z880 Allergy status to penicillin: Secondary | ICD-10-CM

## 2018-07-28 DIAGNOSIS — B20 Human immunodeficiency virus [HIV] disease: Secondary | ICD-10-CM | POA: Diagnosis present

## 2018-07-28 DIAGNOSIS — E1151 Type 2 diabetes mellitus with diabetic peripheral angiopathy without gangrene: Secondary | ICD-10-CM | POA: Diagnosis present

## 2018-07-28 DIAGNOSIS — Z953 Presence of xenogenic heart valve: Secondary | ICD-10-CM

## 2018-07-28 DIAGNOSIS — Z8614 Personal history of Methicillin resistant Staphylococcus aureus infection: Secondary | ICD-10-CM

## 2018-07-28 DIAGNOSIS — E872 Acidosis: Secondary | ICD-10-CM | POA: Diagnosis present

## 2018-07-28 DIAGNOSIS — J90 Pleural effusion, not elsewhere classified: Secondary | ICD-10-CM | POA: Diagnosis present

## 2018-07-28 DIAGNOSIS — D631 Anemia in chronic kidney disease: Secondary | ICD-10-CM | POA: Diagnosis present

## 2018-07-28 DIAGNOSIS — R918 Other nonspecific abnormal finding of lung field: Secondary | ICD-10-CM | POA: Diagnosis not present

## 2018-07-28 DIAGNOSIS — I739 Peripheral vascular disease, unspecified: Secondary | ICD-10-CM | POA: Diagnosis not present

## 2018-07-28 DIAGNOSIS — J45909 Unspecified asthma, uncomplicated: Secondary | ICD-10-CM | POA: Diagnosis not present

## 2018-07-28 DIAGNOSIS — M6281 Muscle weakness (generalized): Secondary | ICD-10-CM | POA: Diagnosis not present

## 2018-07-28 DIAGNOSIS — R0602 Shortness of breath: Secondary | ICD-10-CM

## 2018-07-28 DIAGNOSIS — Z885 Allergy status to narcotic agent status: Secondary | ICD-10-CM

## 2018-07-28 DIAGNOSIS — I083 Combined rheumatic disorders of mitral, aortic and tricuspid valves: Secondary | ICD-10-CM | POA: Diagnosis not present

## 2018-07-28 DIAGNOSIS — Z48812 Encounter for surgical aftercare following surgery on the circulatory system: Secondary | ICD-10-CM | POA: Diagnosis not present

## 2018-07-28 DIAGNOSIS — Z79899 Other long term (current) drug therapy: Secondary | ICD-10-CM

## 2018-07-28 HISTORY — PX: AORTIC VALVE REPLACEMENT: SHX41

## 2018-07-28 HISTORY — PX: TEE WITHOUT CARDIOVERSION: SHX5443

## 2018-07-28 LAB — CREATININE, SERUM
Creatinine, Ser: 9.23 mg/dL — ABNORMAL HIGH (ref 0.61–1.24)
GFR calc Af Amer: 6 mL/min — ABNORMAL LOW (ref >60)
GFR calc non Af Amer: 5 mL/min — ABNORMAL LOW (ref >60)

## 2018-07-28 LAB — POCT I-STAT 3, ART BLOOD GAS (G3+)
ACID-BASE EXCESS: 1 mmol/L (ref 0.0–2.0)
Acid-base deficit: 2 mmol/L (ref 0.0–2.0)
Acid-base deficit: 4 mmol/L — ABNORMAL HIGH (ref 0.0–2.0)
BICARBONATE: 25.9 mmol/L (ref 20.0–28.0)
Bicarbonate: 23.3 mmol/L (ref 20.0–28.0)
Bicarbonate: 21.7 mmol/L (ref 20.0–28.0)
O2 SAT: 99 %
O2 Saturation: 100 %
O2 Saturation: 99 %
PCO2 ART: 40.8 mmHg (ref 32.0–48.0)
PCO2 ART: 42.4 mmHg (ref 32.0–48.0)
PH ART: 7.358 (ref 7.350–7.450)
PH ART: 7.318 — AB (ref 7.350–7.450)
Patient temperature: 35.6
Patient temperature: 37.3
TCO2: 27 mmol/L (ref 22–32)
TCO2: 25 mmol/L (ref 22–32)
TCO2: 23 mmol/L (ref 22–32)
pCO2 arterial: 43.7 mmHg (ref 32.0–48.0)
pH, Arterial: 7.381 (ref 7.350–7.450)
pO2, Arterial: 435 mmHg — ABNORMAL HIGH (ref 83.0–108.0)
pO2, Arterial: 164 mmHg — ABNORMAL HIGH (ref 83.0–108.0)
pO2, Arterial: 129 mmHg — ABNORMAL HIGH (ref 83.0–108.0)

## 2018-07-28 LAB — POCT I-STAT, CHEM 8
BUN: 47 mg/dL — ABNORMAL HIGH (ref 8–23)
BUN: 45 mg/dL — ABNORMAL HIGH (ref 8–23)
BUN: 47 mg/dL — ABNORMAL HIGH (ref 8–23)
BUN: 42 mg/dL — ABNORMAL HIGH (ref 8–23)
BUN: 45 mg/dL — ABNORMAL HIGH (ref 8–23)
CALCIUM ION: 1.17 mmol/L (ref 1.15–1.40)
CALCIUM ION: 1.16 mmol/L (ref 1.15–1.40)
CREATININE: 10.2 mg/dL — AB (ref 0.61–1.24)
CREATININE: 10 mg/dL — AB (ref 0.61–1.24)
CREATININE: 9.4 mg/dL — AB (ref 0.61–1.24)
CREATININE: 9.9 mg/dL — AB (ref 0.61–1.24)
Calcium, Ion: 1.01 mmol/L — ABNORMAL LOW (ref 1.15–1.40)
Calcium, Ion: 1.05 mmol/L — ABNORMAL LOW (ref 1.15–1.40)
Calcium, Ion: 1.18 mmol/L (ref 1.15–1.40)
Chloride: 101 mmol/L (ref 98–111)
Chloride: 98 mmol/L (ref 98–111)
Chloride: 99 mmol/L (ref 98–111)
Chloride: 104 mmol/L (ref 98–111)
Chloride: 105 mmol/L (ref 98–111)
Creatinine, Ser: 9.9 mg/dL — ABNORMAL HIGH (ref 0.61–1.24)
GLUCOSE: 107 mg/dL — AB (ref 70–99)
GLUCOSE: 119 mg/dL — AB (ref 70–99)
GLUCOSE: 102 mg/dL — AB (ref 70–99)
GLUCOSE: 129 mg/dL — AB (ref 70–99)
Glucose, Bld: 120 mg/dL — ABNORMAL HIGH (ref 70–99)
HCT: 25 % — ABNORMAL LOW (ref 39.0–52.0)
HCT: 23 % — ABNORMAL LOW (ref 39.0–52.0)
HCT: 24 % — ABNORMAL LOW (ref 39.0–52.0)
HCT: 28 % — ABNORMAL LOW (ref 39.0–52.0)
HEMATOCRIT: 22 % — AB (ref 39.0–52.0)
HEMOGLOBIN: 8.5 g/dL — AB (ref 13.0–17.0)
HEMOGLOBIN: 7.5 g/dL — AB (ref 13.0–17.0)
HEMOGLOBIN: 7.8 g/dL — AB (ref 13.0–17.0)
HEMOGLOBIN: 8.2 g/dL — AB (ref 13.0–17.0)
HEMOGLOBIN: 9.5 g/dL — AB (ref 13.0–17.0)
POTASSIUM: 4.6 mmol/L (ref 3.5–5.1)
Potassium: 3.8 mmol/L (ref 3.5–5.1)
Potassium: 3.7 mmol/L (ref 3.5–5.1)
Potassium: 3.9 mmol/L (ref 3.5–5.1)
Potassium: 5.7 mmol/L — ABNORMAL HIGH (ref 3.5–5.1)
SODIUM: 138 mmol/L (ref 135–145)
SODIUM: 139 mmol/L (ref 135–145)
Sodium: 136 mmol/L (ref 135–145)
Sodium: 137 mmol/L (ref 135–145)
Sodium: 137 mmol/L (ref 135–145)
TCO2: 29 mmol/L (ref 22–32)
TCO2: 26 mmol/L (ref 22–32)
TCO2: 29 mmol/L (ref 22–32)
TCO2: 27 mmol/L (ref 22–32)
TCO2: 24 mmol/L (ref 22–32)

## 2018-07-28 LAB — GLUCOSE, CAPILLARY
GLUCOSE-CAPILLARY: 120 mg/dL — AB (ref 70–99)
GLUCOSE-CAPILLARY: 126 mg/dL — AB (ref 70–99)
Glucose-Capillary: 97 mg/dL (ref 70–99)
Glucose-Capillary: 118 mg/dL — ABNORMAL HIGH (ref 70–99)
Glucose-Capillary: 119 mg/dL — ABNORMAL HIGH (ref 70–99)
Glucose-Capillary: 105 mg/dL — ABNORMAL HIGH (ref 70–99)
Glucose-Capillary: 119 mg/dL — ABNORMAL HIGH (ref 70–99)

## 2018-07-28 LAB — HEMOGLOBIN AND HEMATOCRIT, BLOOD
HEMATOCRIT: 24.7 % — AB (ref 39.0–52.0)
HEMOGLOBIN: 7.6 g/dL — AB (ref 13.0–17.0)

## 2018-07-28 LAB — CBC
HCT: 31.5 % — ABNORMAL LOW (ref 39.0–52.0)
HEMATOCRIT: 31.6 % — AB (ref 39.0–52.0)
HEMOGLOBIN: 9.4 g/dL — AB (ref 13.0–17.0)
Hemoglobin: 9.7 g/dL — ABNORMAL LOW (ref 13.0–17.0)
MCH: 29.6 pg (ref 26.0–34.0)
MCH: 28.6 pg (ref 26.0–34.0)
MCHC: 30.8 g/dL (ref 30.0–36.0)
MCHC: 29.7 g/dL — ABNORMAL LOW (ref 30.0–36.0)
MCV: 96 fL (ref 80.0–100.0)
MCV: 96 fL (ref 80.0–100.0)
NRBC: 0 % (ref 0.0–0.2)
PLATELETS: 109 10*3/uL — AB (ref 150–400)
PLATELETS: 101 10*3/uL — AB (ref 150–400)
RBC: 3.28 MIL/uL — AB (ref 4.22–5.81)
RBC: 3.29 MIL/uL — AB (ref 4.22–5.81)
RDW: 17.2 % — AB (ref 11.5–15.5)
RDW: 17.6 % — AB (ref 11.5–15.5)
WBC: 7.9 10*3/uL (ref 4.0–10.5)
WBC: 8.3 10*3/uL (ref 4.0–10.5)
nRBC: 0 % (ref 0.0–0.2)

## 2018-07-28 LAB — POCT I-STAT 4, (NA,K, GLUC, HGB,HCT)
GLUCOSE: 101 mg/dL — AB (ref 70–99)
Glucose, Bld: 108 mg/dL — ABNORMAL HIGH (ref 70–99)
HEMATOCRIT: 29 % — AB (ref 39.0–52.0)
HEMATOCRIT: 30 % — AB (ref 39.0–52.0)
Hemoglobin: 9.9 g/dL — ABNORMAL LOW (ref 13.0–17.0)
Hemoglobin: 10.2 g/dL — ABNORMAL LOW (ref 13.0–17.0)
POTASSIUM: 4.1 mmol/L (ref 3.5–5.1)
Potassium: 3.8 mmol/L (ref 3.5–5.1)
SODIUM: 137 mmol/L (ref 135–145)
SODIUM: 140 mmol/L (ref 135–145)

## 2018-07-28 LAB — RENAL FUNCTION PANEL
Albumin: 2.9 g/dL — ABNORMAL LOW (ref 3.5–5.0)
Anion gap: 12 (ref 5–15)
BUN: 52 mg/dL — ABNORMAL HIGH (ref 8–23)
CO2: 19 mmol/L — ABNORMAL LOW (ref 22–32)
Calcium: 8.2 mg/dL — ABNORMAL LOW (ref 8.9–10.3)
Chloride: 101 mmol/L (ref 98–111)
Creatinine, Ser: 9.37 mg/dL — ABNORMAL HIGH (ref 0.61–1.24)
GFR calc Af Amer: 6 mL/min — ABNORMAL LOW (ref >60)
GFR calc non Af Amer: 5 mL/min — ABNORMAL LOW (ref >60)
Glucose, Bld: 123 mg/dL — ABNORMAL HIGH (ref 70–99)
Phosphorus: 4.3 mg/dL (ref 2.5–4.6)
Potassium: 5.5 mmol/L — ABNORMAL HIGH (ref 3.5–5.1)
Sodium: 132 mmol/L — ABNORMAL LOW (ref 135–145)

## 2018-07-28 LAB — PREPARE RBC (CROSSMATCH)

## 2018-07-28 LAB — PLATELET COUNT: Platelets: 96 10*3/uL — ABNORMAL LOW (ref 150–400)

## 2018-07-28 LAB — MAGNESIUM: Magnesium: 1.7 mg/dL (ref 1.7–2.4)

## 2018-07-28 LAB — PROTIME-INR
INR: 1.48
Prothrombin Time: 17.8 seconds — ABNORMAL HIGH (ref 11.4–15.2)

## 2018-07-28 LAB — APTT: APTT: 44 s — AB (ref 24–36)

## 2018-07-28 SURGERY — REPLACEMENT, AORTIC VALVE, OPEN
Anesthesia: General | Site: Chest

## 2018-07-28 MED ORDER — ACETAMINOPHEN 160 MG/5ML PO SOLN: 1000.0000 mg | Freq: Four times a day (QID) | ORAL | Status: AC

## 2018-07-28 MED ORDER — PHENYLEPHRINE HCL-NACL 20-0.9 MG/250ML-% IV SOLN
0.0000 ug/min | INTRAVENOUS | Status: DC
  Administered 2018-07-28: 50 ug/min via INTRAVENOUS
  Filled 2018-07-28: qty 250

## 2018-07-28 MED ORDER — SODIUM CHLORIDE 0.9 % IV SOLN: 250.0000 mL | INTRAVENOUS | Status: DC

## 2018-07-28 MED ORDER — ONDANSETRON HCL 4 MG/2ML IJ SOLN: INTRAMUSCULAR | Status: DC | PRN

## 2018-07-28 MED ORDER — HEMOSTATIC AGENTS (NO CHARGE) OPTIME
TOPICAL | Status: DC | PRN
  Administered 2018-07-28: 1 via TOPICAL
  Administered 2018-07-28: 2 via TOPICAL
  Administered 2018-07-28: 1 via TOPICAL

## 2018-07-28 MED ORDER — BICTEGRAVIR-EMTRICITAB-TENOFOV 50-200-25 MG PO TABS
1.0000 | ORAL_TABLET | Freq: Every day | ORAL | Status: DC
  Administered 2018-07-29 – 2018-08-20 (×21): 1 via ORAL
  Filled 2018-07-28 (×24): qty 1

## 2018-07-28 MED ORDER — LEVOFLOXACIN IN D5W 750 MG/150ML IV SOLN: 750.0000 mg | INTRAVENOUS | Status: DC

## 2018-07-28 MED ORDER — MIDAZOLAM HCL (PF) 10 MG/2ML IJ SOLN
INTRAMUSCULAR | Status: AC
  Filled 2018-07-28: qty 2

## 2018-07-28 MED ORDER — CHLORHEXIDINE GLUCONATE 4 % EX LIQD: 30.0000 mL | CUTANEOUS | Status: DC

## 2018-07-28 MED ORDER — SODIUM CHLORIDE 0.9% FLUSH: 10.0000 mL | INTRAVENOUS | Status: DC | PRN

## 2018-07-28 MED ORDER — SODIUM CHLORIDE 0.45 % IV SOLN
INTRAVENOUS | Status: DC | PRN
  Administered 2018-07-28: 13:00:00 via INTRAVENOUS

## 2018-07-28 MED ORDER — LACOSAMIDE 50 MG PO TABS
150.0000 mg | ORAL_TABLET | Freq: Two times a day (BID) | ORAL | Status: DC
  Administered 2018-07-28 – 2018-07-29 (×2): 150 mg
  Filled 2018-07-28 (×4): qty 3

## 2018-07-28 MED ORDER — LIDOCAINE 2% (20 MG/ML) 5 ML SYRINGE
INTRAMUSCULAR | Status: AC
  Filled 2018-07-28: qty 5

## 2018-07-28 MED ORDER — ASPIRIN EC 325 MG PO TBEC
325.0000 mg | DELAYED_RELEASE_TABLET | Freq: Every day | ORAL | Status: DC
  Administered 2018-07-29 – 2018-08-20 (×22): 325 mg via ORAL
  Filled 2018-07-28 (×22): qty 1

## 2018-07-28 MED ORDER — HEPARIN SODIUM (PORCINE) 1000 UNIT/ML IJ SOLN
INTRAMUSCULAR | Status: AC
  Filled 2018-07-28: qty 1

## 2018-07-28 MED ORDER — ALPRAZOLAM 0.25 MG PO TABS
0.2500 mg | ORAL_TABLET | Freq: Every evening | ORAL | Status: DC | PRN
  Administered 2018-07-30 – 2018-08-19 (×3): 0.25 mg via ORAL
  Filled 2018-07-28 (×4): qty 1

## 2018-07-28 MED ORDER — ACETAMINOPHEN 650 MG RE SUPP
650.0000 mg | Freq: Once | RECTAL | Status: AC
  Administered 2018-07-28: 650 mg via RECTAL

## 2018-07-28 MED ORDER — DEXMEDETOMIDINE HCL IN NACL 200 MCG/50ML IV SOLN
INTRAVENOUS | Status: DC | PRN
  Administered 2018-07-28: 0.7 ug/kg/h via INTRAVENOUS

## 2018-07-28 MED ORDER — CHLORHEXIDINE GLUCONATE CLOTH 2 % EX PADS
6.0000 | MEDICATED_PAD | Freq: Every day | CUTANEOUS | Status: DC
  Administered 2018-07-28 – 2018-08-02 (×5): 6 via TOPICAL

## 2018-07-28 MED ORDER — BISACODYL 10 MG RE SUPP: 10.0000 mg | Freq: Every day | RECTAL | Status: DC

## 2018-07-28 MED ORDER — FENTANYL CITRATE (PF) 250 MCG/5ML IJ SOLN
INTRAMUSCULAR | Status: AC
  Filled 2018-07-28: qty 5

## 2018-07-28 MED ORDER — DEXMEDETOMIDINE HCL IN NACL 200 MCG/50ML IV SOLN: 0.0000 ug/kg/h | INTRAVENOUS | Status: DC

## 2018-07-28 MED ORDER — 0.9 % SODIUM CHLORIDE (POUR BTL) OPTIME
TOPICAL | Status: DC | PRN
  Administered 2018-07-28: 6000 mL

## 2018-07-28 MED ORDER — HEPARIN SODIUM (PORCINE) 1000 UNIT/ML IJ SOLN
INTRAMUSCULAR | Status: DC | PRN
  Administered 2018-07-28: 26 mL via INTRAVENOUS

## 2018-07-28 MED ORDER — ALBUMIN HUMAN 5 % IV SOLN
250.0000 mL | INTRAVENOUS | Status: AC | PRN
  Administered 2018-07-28: 12.5 g via INTRAVENOUS

## 2018-07-28 MED ORDER — CHLORHEXIDINE GLUCONATE 0.12% ORAL RINSE (MEDLINE KIT)
15.0000 mL | Freq: Two times a day (BID) | OROMUCOSAL | Status: DC
  Administered 2018-07-28 – 2018-08-01 (×8): 15 mL via OROMUCOSAL

## 2018-07-28 MED ORDER — SODIUM CHLORIDE 0.9% IV SOLUTION: Freq: Once | INTRAVENOUS | Status: DC

## 2018-07-28 MED ORDER — INSULIN REGULAR BOLUS VIA INFUSION
0.0000 [IU] | Freq: Three times a day (TID) | INTRAVENOUS | Status: DC
  Filled 2018-07-28: qty 10

## 2018-07-28 MED ORDER — ONDANSETRON HCL 4 MG/2ML IJ SOLN
4.0000 mg | Freq: Four times a day (QID) | INTRAMUSCULAR | Status: DC | PRN
  Administered 2018-07-29 (×2): 4 mg via INTRAVENOUS
  Filled 2018-07-28 (×3): qty 2

## 2018-07-28 MED ORDER — ONDANSETRON HCL 4 MG/2ML IJ SOLN
INTRAMUSCULAR | Status: AC
  Filled 2018-07-28: qty 2

## 2018-07-28 MED ORDER — VANCOMYCIN HCL IN DEXTROSE 1-5 GM/200ML-% IV SOLN: 1000.0000 mg | Freq: Once | INTRAVENOUS | Status: DC

## 2018-07-28 MED ORDER — PROTAMINE SULFATE 10 MG/ML IV SOLN
INTRAVENOUS | Status: AC
  Filled 2018-07-28: qty 5

## 2018-07-28 MED ORDER — EPHEDRINE SULFATE-NACL 50-0.9 MG/10ML-% IV SOSY
PREFILLED_SYRINGE | INTRAVENOUS | Status: DC | PRN
  Administered 2018-07-28: 5 mg via INTRAVENOUS

## 2018-07-28 MED ORDER — INSULIN ASPART 100 UNIT/ML ~~LOC~~ SOLN: 0.0000 [IU] | SUBCUTANEOUS | Status: DC

## 2018-07-28 MED ORDER — METOPROLOL TARTRATE 5 MG/5ML IV SOLN
2.5000 mg | INTRAVENOUS | Status: DC | PRN
  Administered 2018-07-28 – 2018-08-01 (×9): 5 mg via INTRAVENOUS
  Filled 2018-07-28 (×8): qty 5

## 2018-07-28 MED ORDER — CHLORHEXIDINE GLUCONATE 0.12 % MT SOLN
15.0000 mL | Freq: Once | OROMUCOSAL | Status: AC
  Administered 2018-07-28: 15 mL via OROMUCOSAL
  Filled 2018-07-28: qty 15

## 2018-07-28 MED ORDER — SODIUM POLYSTYRENE SULFONATE 15 GM/60ML PO SUSP
45.0000 g | Freq: Once | ORAL | Status: AC
  Administered 2018-07-28: 45 g
  Filled 2018-07-28: qty 180

## 2018-07-28 MED ORDER — SODIUM CHLORIDE 0.9% FLUSH
3.0000 mL | Freq: Two times a day (BID) | INTRAVENOUS | Status: DC
  Administered 2018-07-29 – 2018-08-03 (×12): 3 mL via INTRAVENOUS

## 2018-07-28 MED ORDER — SODIUM CHLORIDE 0.9% FLUSH: 3.0000 mL | INTRAVENOUS | Status: DC | PRN

## 2018-07-28 MED ORDER — OXYCODONE HCL 5 MG PO TABS
5.0000 mg | ORAL_TABLET | ORAL | Status: DC | PRN
  Administered 2018-07-29 (×3): 10 mg via ORAL
  Administered 2018-07-29: 5 mg via ORAL
  Administered 2018-07-29 – 2018-07-31 (×4): 10 mg via ORAL
  Administered 2018-07-31: 5 mg via ORAL
  Filled 2018-07-28 (×6): qty 2
  Filled 2018-07-28 (×2): qty 1
  Filled 2018-07-28 (×2): qty 2

## 2018-07-28 MED ORDER — METOPROLOL TARTRATE 12.5 MG HALF TABLET: 12.5000 mg | ORAL_TABLET | Freq: Two times a day (BID) | ORAL | Status: DC

## 2018-07-28 MED ORDER — SODIUM CHLORIDE 0.9 % IV SOLN
INTRAVENOUS | Status: DC | PRN
  Administered 2018-07-28 (×2): 40 ug/min via INTRAVENOUS

## 2018-07-28 MED ORDER — PANTOPRAZOLE SODIUM 40 MG PO TBEC
40.0000 mg | DELAYED_RELEASE_TABLET | Freq: Every day | ORAL | Status: DC
  Administered 2018-07-31 – 2018-08-20 (×19): 40 mg via ORAL
  Filled 2018-07-28 (×20): qty 1

## 2018-07-28 MED ORDER — CHLORHEXIDINE GLUCONATE 0.12 % MT SOLN
15.0000 mL | OROMUCOSAL | Status: AC
  Administered 2018-07-28: 15 mL via OROMUCOSAL

## 2018-07-28 MED ORDER — INSULIN REGULAR(HUMAN) IN NACL 100-0.9 UT/100ML-% IV SOLN: INTRAVENOUS | Status: DC

## 2018-07-28 MED ORDER — LACTATED RINGERS IV SOLN: INTRAVENOUS | Status: DC

## 2018-07-28 MED ORDER — TRAMADOL HCL 50 MG PO TABS
50.0000 mg | ORAL_TABLET | Freq: Two times a day (BID) | ORAL | Status: DC | PRN
  Administered 2018-07-29: 50 mg via ORAL
  Administered 2018-07-29: 100 mg via ORAL
  Administered 2018-07-31: 50 mg via ORAL
  Administered 2018-08-01 – 2018-08-04 (×5): 100 mg via ORAL
  Administered 2018-08-05 – 2018-08-08 (×5): 50 mg via ORAL
  Administered 2018-08-09 – 2018-08-19 (×10): 100 mg via ORAL
  Filled 2018-07-28: qty 2
  Filled 2018-07-28: qty 1
  Filled 2018-07-28: qty 2
  Filled 2018-07-28: qty 1
  Filled 2018-07-28 (×3): qty 2
  Filled 2018-07-28: qty 1
  Filled 2018-07-28 (×3): qty 2
  Filled 2018-07-28: qty 1
  Filled 2018-07-28 (×5): qty 2
  Filled 2018-07-28 (×2): qty 1
  Filled 2018-07-28 (×3): qty 2
  Filled 2018-07-28: qty 1
  Filled 2018-07-28: qty 2
  Filled 2018-07-28: qty 1
  Filled 2018-07-28: qty 2

## 2018-07-28 MED ORDER — SODIUM CHLORIDE 0.9 % IV SOLN
INTRAVENOUS | Status: DC | PRN
  Administered 2018-07-28: 07:00:00 via INTRAVENOUS

## 2018-07-28 MED ORDER — PHENYLEPHRINE 40 MCG/ML (10ML) SYRINGE FOR IV PUSH (FOR BLOOD PRESSURE SUPPORT)
PREFILLED_SYRINGE | INTRAVENOUS | Status: AC
  Filled 2018-07-28: qty 10

## 2018-07-28 MED ORDER — ONDANSETRON HCL 4 MG/2ML IJ SOLN
INTRAMUSCULAR | Status: DC | PRN
  Administered 2018-07-28: 4 mg via INTRAVENOUS

## 2018-07-28 MED ORDER — FENTANYL CITRATE (PF) 250 MCG/5ML IJ SOLN
INTRAMUSCULAR | Status: DC | PRN
  Administered 2018-07-28 (×3): 250 ug via INTRAVENOUS
  Administered 2018-07-28: 50 ug via INTRAVENOUS
  Administered 2018-07-28: 200 ug via INTRAVENOUS
  Administered 2018-07-28: 250 ug via INTRAVENOUS

## 2018-07-28 MED ORDER — LACTATED RINGERS IV SOLN: 500.0000 mL | Freq: Once | INTRAVENOUS | Status: DC | PRN

## 2018-07-28 MED ORDER — MIDAZOLAM HCL 5 MG/5ML IJ SOLN
INTRAMUSCULAR | Status: DC | PRN
  Administered 2018-07-28: 1 mg via INTRAVENOUS
  Administered 2018-07-28: 3 mg via INTRAVENOUS
  Administered 2018-07-28: 1 mg via INTRAVENOUS

## 2018-07-28 MED ORDER — PROPOFOL 10 MG/ML IV BOLUS
INTRAVENOUS | Status: DC | PRN
  Administered 2018-07-28: 100 mg via INTRAVENOUS

## 2018-07-28 MED ORDER — PROTAMINE SULFATE 10 MG/ML IV SOLN
INTRAVENOUS | Status: AC
  Filled 2018-07-28: qty 25

## 2018-07-28 MED ORDER — CISATRACURIUM BESYLATE 20 MG/10ML IV SOLN
INTRAVENOUS | Status: AC
  Filled 2018-07-28: qty 10

## 2018-07-28 MED ORDER — ASPIRIN 81 MG PO CHEW
324.0000 mg | CHEWABLE_TABLET | Freq: Every day | ORAL | Status: DC
  Filled 2018-07-28 (×6): qty 4

## 2018-07-28 MED ORDER — ARTIFICIAL TEARS OPHTHALMIC OINT
TOPICAL_OINTMENT | OPHTHALMIC | Status: AC
  Filled 2018-07-28: qty 3.5

## 2018-07-28 MED ORDER — BISACODYL 5 MG PO TBEC
10.0000 mg | DELAYED_RELEASE_TABLET | Freq: Every day | ORAL | Status: DC
  Administered 2018-07-29 – 2018-08-06 (×7): 10 mg via ORAL
  Filled 2018-07-28 (×7): qty 2

## 2018-07-28 MED ORDER — LACOSAMIDE 50 MG PO TABS: 150.0000 mg | ORAL_TABLET | Freq: Two times a day (BID) | ORAL | Status: DC

## 2018-07-28 MED ORDER — RENA-VITE PO TABS
1.0000 | ORAL_TABLET | Freq: Every day | ORAL | Status: DC
  Administered 2018-07-29 – 2018-08-19 (×23): 1 via ORAL
  Filled 2018-07-28 (×23): qty 1

## 2018-07-28 MED ORDER — ACETAMINOPHEN 500 MG PO TABS
1000.0000 mg | ORAL_TABLET | Freq: Four times a day (QID) | ORAL | Status: AC
  Administered 2018-07-29 – 2018-08-02 (×16): 1000 mg via ORAL
  Filled 2018-07-28 (×17): qty 2

## 2018-07-28 MED ORDER — MIDAZOLAM HCL 2 MG/2ML IJ SOLN: 2.0000 mg | INTRAMUSCULAR | Status: DC | PRN

## 2018-07-28 MED ORDER — PHENYLEPHRINE 40 MCG/ML (10ML) SYRINGE FOR IV PUSH (FOR BLOOD PRESSURE SUPPORT)
PREFILLED_SYRINGE | INTRAVENOUS | Status: DC | PRN
  Administered 2018-07-28 (×4): 80 ug via INTRAVENOUS

## 2018-07-28 MED ORDER — POTASSIUM CHLORIDE 10 MEQ/50ML IV SOLN: 10.0000 meq | INTRAVENOUS | Status: AC

## 2018-07-28 MED ORDER — METOPROLOL TARTRATE 12.5 MG HALF TABLET
12.5000 mg | ORAL_TABLET | Freq: Once | ORAL | Status: DC
  Filled 2018-07-28: qty 1

## 2018-07-28 MED ORDER — PROTAMINE SULFATE 10 MG/ML IV SOLN
INTRAVENOUS | Status: DC | PRN
  Administered 2018-07-28: 20 mg via INTRAVENOUS
  Administered 2018-07-28: 130 mg via INTRAVENOUS
  Administered 2018-07-28: 30 mg via INTRAVENOUS
  Administered 2018-07-28: 80 mg via INTRAVENOUS

## 2018-07-28 MED ORDER — ALBUTEROL SULFATE (2.5 MG/3ML) 0.083% IN NEBU: 2.5000 mg | INHALATION_SOLUTION | Freq: Four times a day (QID) | RESPIRATORY_TRACT | Status: DC | PRN

## 2018-07-28 MED ORDER — FAMOTIDINE IN NACL 20-0.9 MG/50ML-% IV SOLN
20.0000 mg | INTRAVENOUS | Status: AC
  Administered 2018-07-28: 20 mg via INTRAVENOUS

## 2018-07-28 MED ORDER — METOPROLOL TARTRATE 25 MG/10 ML ORAL SUSPENSION
12.5000 mg | Freq: Two times a day (BID) | ORAL | Status: DC
  Administered 2018-07-28: 12.5 mg
  Filled 2018-07-28: qty 5

## 2018-07-28 MED ORDER — ROCURONIUM BROMIDE 50 MG/5ML IV SOSY
PREFILLED_SYRINGE | INTRAVENOUS | Status: AC
  Filled 2018-07-28: qty 5

## 2018-07-28 MED ORDER — ACETAMINOPHEN 160 MG/5ML PO SOLN: 650.0000 mg | Freq: Once | ORAL | Status: AC

## 2018-07-28 MED ORDER — SODIUM CHLORIDE 0.9 % IV SOLN
INTRAVENOUS | Status: DC
  Administered 2018-07-29: 08:00:00 via INTRAVENOUS

## 2018-07-28 MED ORDER — NITROGLYCERIN IN D5W 200-5 MCG/ML-% IV SOLN
0.0000 ug/min | INTRAVENOUS | Status: DC
  Administered 2018-07-29 – 2018-07-30 (×5): 100 ug/min via INTRAVENOUS
  Filled 2018-07-28 (×5): qty 250

## 2018-07-28 MED ORDER — MAGNESIUM SULFATE 4 GM/100ML IV SOLN
4.0000 g | Freq: Once | INTRAVENOUS | Status: DC
  Filled 2018-07-28 (×2): qty 100

## 2018-07-28 MED ORDER — ORAL CARE MOUTH RINSE
15.0000 mL | OROMUCOSAL | Status: DC
  Administered 2018-07-28 (×4): 15 mL via OROMUCOSAL

## 2018-07-28 MED ORDER — EPHEDRINE 5 MG/ML INJ
INTRAVENOUS | Status: AC
  Filled 2018-07-28: qty 10

## 2018-07-28 MED ORDER — LACOSAMIDE 50 MG PO TABS
150.0000 mg | ORAL_TABLET | Freq: Two times a day (BID) | ORAL | Status: DC
  Filled 2018-07-28: qty 3

## 2018-07-28 MED ORDER — DOCUSATE SODIUM 100 MG PO CAPS
200.0000 mg | ORAL_CAPSULE | Freq: Every day | ORAL | Status: DC
  Administered 2018-07-29 – 2018-08-04 (×5): 200 mg via ORAL
  Filled 2018-07-28 (×5): qty 2

## 2018-07-28 MED ORDER — PROPOFOL 10 MG/ML IV BOLUS
INTRAVENOUS | Status: AC
  Filled 2018-07-28: qty 20

## 2018-07-28 MED ORDER — ALBUMIN HUMAN 5 % IV SOLN
INTRAVENOUS | Status: DC | PRN
  Administered 2018-07-28: 12:00:00 via INTRAVENOUS

## 2018-07-28 MED ORDER — CISATRACURIUM BESYLATE (PF) 10 MG/5ML IV SOLN
INTRAVENOUS | Status: DC | PRN
  Administered 2018-07-28: 6 mg via INTRAVENOUS
  Administered 2018-07-28: 8 mg via INTRAVENOUS
  Administered 2018-07-28 (×2): 20 mg via INTRAVENOUS

## 2018-07-28 MED ORDER — SODIUM CHLORIDE 0.9 % IV SOLN
INTRAVENOUS | Status: DC
  Administered 2018-07-28: 13:00:00 via INTRAVENOUS

## 2018-07-28 MED ORDER — SODIUM CHLORIDE 0.9% FLUSH
10.0000 mL | Freq: Two times a day (BID) | INTRAVENOUS | Status: DC
  Administered 2018-07-28 – 2018-08-02 (×10): 10 mL
  Administered 2018-08-03: 20 mL
  Administered 2018-08-03: 10 mL

## 2018-07-28 SURGICAL SUPPLY — 84 items
ADAPTER CARDIO PERF ANTE/RETRO (ADAPTER) ×4 IMPLANT
APPLICATOR COTTON TIP 6IN STRL (MISCELLANEOUS) ×4 IMPLANT
BAG DECANTER FOR FLEXI CONT (MISCELLANEOUS) IMPLANT
BLADE STERNUM SYSTEM 6 (BLADE) ×4 IMPLANT
BLADE SURG 15 STRL LF DISP TIS (BLADE) ×2 IMPLANT
BLADE SURG 15 STRL SS (BLADE) ×2
BLOOD HAEMOCONCENTR 700 MIDI (MISCELLANEOUS) ×4 IMPLANT
CANISTER SUCT 3000ML PPV (MISCELLANEOUS) ×4 IMPLANT
CANNULA AORTIC ROOT 9FR (CANNULA) ×4 IMPLANT
CANNULA EZ GLIDE AORTIC 21FR (CANNULA) ×4 IMPLANT
CANNULA GUNDRY RCSP 15FR (MISCELLANEOUS) ×4 IMPLANT
CANNULA MC2 2 STG 36/46 NON-V (CANNULA) ×2 IMPLANT
CANNULA VENOUS 2 STG 34/46 (CANNULA) ×2
CATH CPB KIT HENDRICKSON (MISCELLANEOUS) ×4 IMPLANT
CATH HEART VENT LEFT (CATHETERS) ×2 IMPLANT
CATH ROBINSON RED A/P 18FR (CATHETERS) ×8 IMPLANT
CATH THORACIC 36FR RT ANG (CATHETERS) IMPLANT
CLIP FOGARTY SPRING 6M (CLIP) IMPLANT
CONT SPEC 4OZ CLIKSEAL STRL BL (MISCELLANEOUS) ×4 IMPLANT
COVER WAND RF STERILE (DRAPES) ×4 IMPLANT
CRADLE DONUT ADULT HEAD (MISCELLANEOUS) ×4 IMPLANT
DRAPE SLUSH/WARMER DISC (DRAPES) ×4 IMPLANT
DRSG AQUACEL AG ADV 3.5X14 (GAUZE/BANDAGES/DRESSINGS) ×4 IMPLANT
DRSG COVADERM 4X14 (GAUZE/BANDAGES/DRESSINGS) ×4 IMPLANT
ELECT REM PT RETURN 9FT ADLT (ELECTROSURGICAL) ×8
ELECTRODE REM PT RTRN 9FT ADLT (ELECTROSURGICAL) ×4 IMPLANT
FELT TEFLON 1X6 (MISCELLANEOUS) ×8 IMPLANT
GAUZE SPONGE 4X4 12PLY STRL (GAUZE/BANDAGES/DRESSINGS) ×8 IMPLANT
GAUZE SPONGE 4X4 12PLY STRL LF (GAUZE/BANDAGES/DRESSINGS) ×4 IMPLANT
GLOVE BIOGEL PI IND STRL 6.5 (GLOVE) ×2 IMPLANT
GLOVE BIOGEL PI INDICATOR 6.5 (GLOVE) ×2
GLOVE SURG SIGNA 7.5 PF LTX (GLOVE) ×8 IMPLANT
GOWN STRL REUS W/ TWL LRG LVL3 (GOWN DISPOSABLE) ×14 IMPLANT
GOWN STRL REUS W/ TWL XL LVL3 (GOWN DISPOSABLE) ×2 IMPLANT
GOWN STRL REUS W/TWL LRG LVL3 (GOWN DISPOSABLE) ×14
GOWN STRL REUS W/TWL XL LVL3 (GOWN DISPOSABLE) ×2
HEMOSTAT ARISTA ABSORB 3G PWDR (MISCELLANEOUS) ×8 IMPLANT
HEMOSTAT POWDER SURGIFOAM 1G (HEMOSTASIS) IMPLANT
HEMOSTAT SURGICEL 2X14 (HEMOSTASIS) ×4 IMPLANT
INSERT FOGARTY XLG (MISCELLANEOUS) IMPLANT
KIT BASIN OR (CUSTOM PROCEDURE TRAY) ×4 IMPLANT
KIT SUCTION CATH 14FR (SUCTIONS) ×8 IMPLANT
KIT TURNOVER KIT B (KITS) ×4 IMPLANT
LINE VENT (MISCELLANEOUS) ×4 IMPLANT
NS IRRIG 1000ML POUR BTL (IV SOLUTION) ×20 IMPLANT
PACK E OPEN HEART (SUTURE) ×4 IMPLANT
PACK OPEN HEART (CUSTOM PROCEDURE TRAY) ×4 IMPLANT
PAD ARMBOARD 7.5X6 YLW CONV (MISCELLANEOUS) ×8 IMPLANT
POWDER SURGICEL 3.0 GRAM (HEMOSTASIS) ×4 IMPLANT
SENSOR MYOCARDIAL TEMP (MISCELLANEOUS) ×4 IMPLANT
SET CARDIOPLEGIA MPS 5001102 (MISCELLANEOUS) ×4 IMPLANT
SUT BONE WAX W31G (SUTURE) ×4 IMPLANT
SUT ETHIBON 2 0 V 52N 30 (SUTURE) ×16 IMPLANT
SUT ETHIBON EXCEL 2-0 V-5 (SUTURE) IMPLANT
SUT ETHIBOND 2 0 SH (SUTURE) ×2
SUT ETHIBOND 2 0 SH 36X2 (SUTURE) ×2 IMPLANT
SUT ETHIBOND 2 0 V4 (SUTURE) IMPLANT
SUT ETHIBOND 2 0V4 GREEN (SUTURE) IMPLANT
SUT ETHIBOND 4 0 RB 1 (SUTURE) IMPLANT
SUT ETHIBOND V-5 VALVE (SUTURE) IMPLANT
SUT PROLENE 3 0 SH 1 (SUTURE) IMPLANT
SUT PROLENE 3 0 SH DA (SUTURE) ×4 IMPLANT
SUT PROLENE 4 0 RB 1 (SUTURE) ×10
SUT PROLENE 4-0 RB1 .5 CRCL 36 (SUTURE) ×10 IMPLANT
SUT SILK  1 MH (SUTURE) ×2
SUT SILK 1 MH (SUTURE) ×2 IMPLANT
SUT STEEL 6MS V (SUTURE) IMPLANT
SUT STEEL SZ 6 DBL 3X14 BALL (SUTURE) IMPLANT
SUT VIC AB 1 CTX 36 (SUTURE) ×4
SUT VIC AB 1 CTX36XBRD ANBCTR (SUTURE) ×4 IMPLANT
SUT VIC AB 2-0 CTX 27 (SUTURE) IMPLANT
SUT VIC AB 3-0 X1 27 (SUTURE) IMPLANT
SYR 10ML KIT SKIN ADHESIVE (MISCELLANEOUS) IMPLANT
SYSTEM SAHARA CHEST DRAIN ATS (WOUND CARE) ×4 IMPLANT
TAPE CLOTH SURG 4X10 WHT LF (GAUZE/BANDAGES/DRESSINGS) ×4 IMPLANT
TAPE PAPER 2X10 WHT MICROPORE (GAUZE/BANDAGES/DRESSINGS) ×4 IMPLANT
TOWEL GREEN STERILE (TOWEL DISPOSABLE) ×4 IMPLANT
TOWEL GREEN STERILE FF (TOWEL DISPOSABLE) ×4 IMPLANT
TRAY FOLEY SLVR 16FR TEMP STAT (SET/KITS/TRAYS/PACK) ×4 IMPLANT
TUBE SUCT INTRACARD DLP 20F (MISCELLANEOUS) ×4 IMPLANT
UNDERPAD 30X30 (UNDERPADS AND DIAPERS) ×4 IMPLANT
VALVE AORTIC SZ23 INSP/RESIL (Prosthesis & Implant Heart) ×4 IMPLANT
VENT LEFT HEART 12002 (CATHETERS) ×4
WATER STERILE IRR 1000ML POUR (IV SOLUTION) ×8 IMPLANT

## 2018-07-28 NOTE — Brief Op Note (Signed)
07/28/2018  12:21 PM  PATIENT:  Jeffrey [name]  62 y.o. male  PRE-OPERATIVE DIAGNOSIS:  SEVERE AI  POST-OPERATIVE DIAGNOSIS:  SEVERE AI  PROCEDURE: TRANSESOPHAGEAL ECHOCARDIOGRAM (TEE),  MEDIAN STERNOTOMY for AORTIC VALVE REPLACEMENT (AVR) using Inspiris Resilia  Aortic Valve (Model # 11500A, Serial # [id number], size 23)  SURGEON:  Surgeon(s) and Role:    [name], MD - Primary  PHYSICIAN ASSISTANT: Lars Pinks PA-C  ASSISTANTS: Dineen Kid RNFA  ANESTHESIA:   general  EBL:  700 mL   DRAINS: Chest tubes placed in the mediastinal and pleural spaces   SPECIMEN:  Source of Specimen:  Native AV leaflets  DISPOSITION OF SPECIMEN:  Pathology and culture  COUNTS CORRECT:  YES  DICTATION: .Dragon Dictation  PLAN OF CARE: Admit to inpatient   PATIENT DISPOSITION:  ICU - intubated and hemodynamically stable.   Delay start of Pharmacological VTE agent (>24hrs) due to surgical blood loss or risk of bleeding: yes  BASELINE WEIGHT: 69 kg  Scarred perforation of left cusp of aortic valve. No vegetations seen.

## 2018-07-28 NOTE — Interval H&P Note (Signed)
History and Physical Interval Note: Patient seen and examined. Cath images reviewed Conclusion     Hemodynamic findings consistent with mild pulmonary hypertension.  LV end diastolic pressure is moderately elevated.  Normal RHC pressures with High CO/CI  Dist LM lesion is 40% stenosed.  Prox LAD lesion is 40% stenosed with 30% stenosed side branch in Ost 2nd Diag.   SUMMARY  Moderate single-vessel disease with extensive calcification.  Severe systemic hypertension -unresponsive to medications  Relatively normal right heart cath pressures.  High output by both Thermodilution and Fick.    Will proceed with AVR 07/28/2018 8:21 AM  Jeffrey [name]  has presented today for surgery, with the diagnosis of AI  The various methods of treatment have been discussed with the patient and family. After consideration of risks, benefits and other options for treatment, the patient has consented to  Procedure(s): AORTIC VALVE REPLACEMENT (AVR) (N/A) TRANSESOPHAGEAL ECHOCARDIOGRAM (TEE) (N/A) as a surgical intervention .  The patient's history has been reviewed, patient examined, no change in status, stable for surgery.  I have reviewed the patient's chart and labs.  Questions were answered to the patient's satisfaction.     Jeffrey [name]

## 2018-07-28 NOTE — Op Note (Signed)
NAME: Jeffrey [name], Jeffrey [name] MEDICAL RECORD ER:[id number] ACCOUNT [id number] DATE OF BIRTH:13-Dec-1955 FACILITY: [facility] LOCATION: [facility] PHYSICIAN:Cabrini Ruggieri Chaya Jan, MD  OPERATIVE REPORT  DATE OF PROCEDURE:  07/28/2018  PREOPERATIVE DIAGNOSIS:  Severe aortic insufficiency.  POSTOPERATIVE DIAGNOSIS:  Severe aortic insufficiency.  PROCEDURE:  Median sternotomy, extracorporeal circulation, aortic valve replacement with Edwards Inspiris Resilia bovine pericardial valve (model #11500A, serial# [id number].  SURGEON:  Modesto Charon, MD  ASSISTANT:  Lars Pinks, PA-C  ANESTHESIA:  General.  FINDINGS:  Transesophageal echocardiography showed severe aortic insufficiency with preserved left ventricular systolic function.  Post-bypass transesophageal echocardiography showed good function of the prosthetic valve with no paravalvular leaks.   Trileaflet aortic valve with right and noncoronary cusp being normal.  There was a perforation in the left coronary cusp and no active vegetations were seen.  CLINICAL NOTE:  The patient is a 62 year old man with a history of end-stage renal disease on hemodialysis.  He has had recurrent aortic valve endocarditis and now has severe aortic insufficiency.  He was referred for aortic valve replacement.  The  indications, risks, benefits, and alternatives were discussed in detail with the patient. Given his history of noncompliance, he was not felt to be a candidate for a mechanical valve.  He accepted the risks and agreed to proceed.  DESCRIPTION OF PROCEDURE:  The patient was brought to the preoperative holding area on 07/28/2018.  Anesthesia placed a Swan-Ganz catheter and an arterial blood pressure monitoring line.  He was taken to the Operating Room, anesthetized and intubated.   Transesophageal echocardiography was performed by Dr. Marcie Bal.  Please see his separately dictated note for full details of the procedure.  It showed severe aortic insufficiency.   There was preserved left ventricular function.  There was mild mitral  regurgitation, no vegetations were seen.  A Foley catheter was placed.  The chest, abdomen and legs were prepped and draped in the usual sterile fashion.  A median sternotomy was performed.  Hemostasis was achieved.  A sternal retractor was placed.  The patient was fully heparinized.  The pericardium was opened.  There was a small pericardial effusion.  After confirming adequate anticoagulation with ACT measurement, the aorta was cannulated via concentric 2-0 Ethibond pledgeted pursestring sutures.   A dual stage venous cannula was placed via pursestring suture in the right atrial appendage.  Cardiopulmonary bypass was initiated.  Flows were maintained per protocol.  The patient was cooled to 32 degrees Celsius. Carbon dioxide was insufflated into the operative field. A left ventricular vent was placed  via pursestring suture in the right superior pulmonary vein.  A retrograde cardioplegia cannula was advanced into the coronary sinus via a pursestring suture in the right atrium.  An antegrade cardioplegia cannula was placed in the ascending aorta.  The  aorta was cross clamped.  The left ventricle was emptied via the aortic root vent and left ventricular vent.  Cardiac arrest was achieved with a combination of cold antegrade and retrograde blood cardioplegia.  An initial 500 mL of cardioplegia was  administered antegrade.  There was a diastolic arrest despite relatively low root pressures.  An additional liter of cardioplegia was administered retrograde.  There was septal cooling to 9 degrees Celsius.  Additional doses of cardioplegia were  administered at 15-20 minute intervals during the crossclamp portion of the procedure.  An aortotomy was performed.  The valve was inspected.  There was a perforation in the left coronary leaflet.  This was a sizable perforation.  There was scarring  around  it, but no vegetation was seen.  The right  noncoronary cusp appeared relatively normal.  The cuffs were excised.  There was no annular calcification.  The annulus sized for a 23 mm Edwards Inspiris Resilia pericardial valve.  2-0 Ethibond  horizontal mattress sutures with subannular pledgets were placed circumferentially around the annulus, 16 sutures were utilized in all.  The sutures were passed through the sewing ring of the valve.  The valve was lowered into place.  It seated nicely.   The sutures were sequentially tied.  The annulus was inspected.  There were no gaps.  The coronary ostia were inspected and there was no obstruction or impingement of those in any way.  Rewarming was begun.  The aortotomy was closed in 2 layers with  running 4-0 Prolene sutures.  The first layer was a running horizontal mattress suture.  At the completion of this layer, the patient was placed in Trendelenburg position and initial deairing maneuvers were performed.  The second layer was a running  simple suture.  At the completion of this layer, a warm dose of retrograde cardioplegia was administered.  Additional de-airing maneuvers were performed and the aortic crossclamp was removed.  The total crossclamp time was 56 minutes.  The patient  initially fibrillated.  He converted with a single defibrillation with 10 joules.  He then was in sinus rhythm thereafter.  The retrograde cardioplegia cannula was removed.  De-airing maneuvers were again performed as the left ventricular vent was removed.  When the patient had reached a core temperature of 37 degrees Celsius, he was weaned from  cardiopulmonary bypass on the first attempt.  He was atrially paced for rate and on no inotropic support at the time of separation from bypass.  Total bypass time was 85 minutes.  The initial cardiac index was greater than 2 liters per minute per meter  squared, and the patient remained hemodynamically stable throughout the post-bypass.  Post-bypass transesophageal echocardiography  revealed preserved left ventricular systolic function.  There was good function of the prosthetic valve with no  paravalvular leaks.  A test dose of protamine was administered and was well tolerated.  The atrial and aortic cannulae were removed.  The remainder of the protamine was administered without incident.  The aortic root vent was removed.  The chest was  copiously irrigated with warm saline.  Hemostasis was achieved.  The pericardium was reapproximated with interrupted 3-0 silk sutures.  It came together easily without tension.  The left pleural space was opened and the left pleural effusion was drained.   There was about 1.5 liters of serous fluid.  Two mediastinal chest tubes were placed through separate subcostal incisions and secured with #1 silk sutures.  Sternum was closed with a combination of single and double heavy gauge stainless steel wires.   Pectoralis fascia, subcutaneous tissue and skin were closed in standard fashion.  All sponge, needle and instrument counts were correct at the end of the procedure.  The patient was taken from the operating room to the surgical Intensive Care Unit,  intubated and in good condition.  TN/NUANCE  D:07/28/2018 T:07/28/2018 JOB:[id number]

## 2018-07-28 NOTE — Anesthesia Procedure Notes (Signed)
Arterial Line Insertion Start/End11/03/2018 7:10 AM, 07/28/2018 7:15 AM Performed by: Orlie Dakin, CRNA, CRNA  Patient location: Pre-op. Preanesthetic checklist: patient identified, IV checked, site marked, risks and benefits discussed, surgical consent, monitors and equipment checked and pre-op evaluation Lidocaine 1% used for infiltration and patient sedated Right, radial was placed Catheter size: 20 G Hand hygiene performed  and maximum sterile barriers used   Attempts: 1 Procedure performed without using ultrasound guided technique. Following insertion, dressing applied and Biopatch. Post procedure assessment: normal  Patient tolerated the procedure well with no immediate complications.

## 2018-07-28 NOTE — Procedures (Signed)
Extubation Procedure Note  Patient Details:   Name: Jeffrey [name] DOB: 1956/09/13 MRN: [id number]   Airway Documentation:  Airway 8 mm (Active)  Secured at (cm) 25 cm 07/28/2018  7:05 PM  Measured From Lips 07/28/2018  7:05 PM  Secured Location Right 07/28/2018  7:05 PM  Secured By Pink Tape 07/28/2018  7:05 PM   Vent end date: 07/28/18 Vent end time: 2310   Evaluation  O2 sats: stable throughout Complications: No apparent complications Patient did tolerate procedure well. Bilateral Breath Sounds: Clear, Diminished   Yes   Weaning mechanics done prior to extubation. NIF -40 and VC 800 ml. Pt had positive cuff leak and was extubated to 4L NCAN and able to speak afterwards. Voice hoarse.   Darryl Nestle F 07/28/2018, 11:14 PM

## 2018-07-28 NOTE — Transfer of Care (Signed)
Immediate Anesthesia Transfer of Care Note  Patient: Jeffrey [name]  Procedure(s) Performed: AORTIC VALVE REPLACEMENT (AVR) using Inspiris Resilia  size 23 Aortic Valve. (N/A Chest) TRANSESOPHAGEAL ECHOCARDIOGRAM (TEE) (N/A )  Patient Location: SICU  Anesthesia Type:General  Level of Consciousness: sedated and Patient remains intubated per anesthesia plan  Airway & Oxygen Therapy: Patient remains intubated per anesthesia plan and Patient placed on Ventilator (see vital sign flow sheet for setting)  Post-op Assessment: Report given to RN and Post -op Vital signs reviewed and stable  Post vital signs: Reviewed and stable  Last Vitals:  Vitals Value Taken Time  BP    Temp    Pulse    Resp    SpO2      Last Pain:  Vitals:   07/28/18 0635  TempSrc:   PainSc: 0-No pain         Complications: No apparent anesthesia complications

## 2018-07-28 NOTE — Progress Notes (Signed)
Pt's BP on monitor 174/137, rechecked manually-->160/80. Pulse 64  Pt took all BP meds this AM including home beta-blocker, spoke with Dr. Marcie Bal about pt's BP.  Per Dr. Marcie Bal, do not give PO metoprolol.   Last dialysis last night.

## 2018-07-28 NOTE — Anesthesia Procedure Notes (Signed)
Procedure Name: Intubation Date/Time: 07/28/2018 8:53 AM Performed by: Orlie Dakin, CRNA Pre-anesthesia Checklist: Patient identified, Emergency Drugs available, Suction available and Patient being monitored Patient Re-evaluated:Patient Re-evaluated prior to induction Oxygen Delivery Method: Circle system utilized Preoxygenation: Pre-oxygenation with 100% oxygen Induction Type: IV induction Ventilation: Mask ventilation without difficulty and Oral airway inserted - appropriate to patient size Laryngoscope Size: Sabra Heck and 3 Grade View: Grade I Tube type: Subglottic suction tube Tube size: 8.0 mm Number of attempts: 1 Airway Equipment and Method: Stylet Placement Confirmation: ETT inserted through vocal cords under direct vision,  positive ETCO2 and breath sounds checked- equal and bilateral Secured at: 24 cm Tube secured with: Tape Dental Injury: Teeth and Oropharynx as per pre-operative assessment

## 2018-07-28 NOTE — Anesthesia Procedure Notes (Signed)
Central Venous Catheter Insertion Performed by: Albertha Ghee, MD, anesthesiologist Start/End11/03/2018 7:10 AM, 07/28/2018 7:27 AM Patient location: Pre-op. Preanesthetic checklist: patient identified, IV checked, site marked, risks and benefits discussed, surgical consent, monitors and equipment checked, pre-op evaluation, timeout performed and anesthesia consent Position: Trendelenburg Lidocaine 1% used for infiltration and patient sedated Hand hygiene performed , maximum sterile barriers used  and Seldinger technique used Catheter size: 9 Fr Central line and PA cath was placed.MAC introducer Swan type:thermodilation Procedure performed using ultrasound guided technique. Ultrasound Notes:anatomy identified, needle tip was noted to be adjacent to the nerve/plexus identified, no ultrasound evidence of intravascular and/or intraneural injection and image(s) printed for medical record Attempts: 1 Following insertion, line sutured, dressing applied and Biopatch. Post procedure assessment: blood return through all ports, free fluid flow and no air  Patient tolerated the procedure well with no immediate complications.

## 2018-07-28 NOTE — Progress Notes (Signed)
PHARMACY NOTE:  ANTIMICROBIAL RENAL DOSAGE ADJUSTMENT  Current antimicrobial regimen includes a mismatch between antimicrobial dosage and estimated renal function.  As per policy approved by the Pharmacy & Therapeutics and Medical Executive Committees, the antimicrobial dosage will be adjusted accordingly.  Current antimicrobial dosage: vancomycin x1 and levaquin x1 post op  Indication: surgical prophylaxis  Renal Function:  ESRD on HD  Estimated Creatinine Clearance: 7.4 mL/min (A) (by C-G formula based on SCr of 9.9 mg/dL (H)). [x]      On intermittent HD, scheduled: []      On CRRT    Antimicrobial dosage has been changed to:  No further antibiotic coverage needed  Additional comments: Patient received levaquiin 500mg  IV and vancomycin 1250mg  IV pre-op. With ESRD he should not need further dosing   Thank you for allowing pharmacy to be a part of this patient's care.  Hildred Laser, PharmD Clinical Pharmacist **Pharmacist phone directory can now be found on [email or web address] ([email or web address]).  Listed under [place].

## 2018-07-28 NOTE — Progress Notes (Signed)
  Echocardiogram Echocardiogram Transesophageal has been performed.  Jeffrey [name] 07/28/2018, 9:13 AM

## 2018-07-28 NOTE — Progress Notes (Signed)
   07/28/18 1700  Clinical Encounter Type  Visited With Patient and family together  Visit Type Follow-up;Spiritual support  Referral From Family  Spiritual Encounters  Spiritual Needs Prayer;Emotional  Follow-up with patient after surgery. Pt. Brother at bedside. Provided emotional and spiritual support. The nurse, the patient's brother and the patient appreciated the support. Follow-up as needed Matthew Folks Chaplain [phone]

## 2018-07-28 NOTE — Progress Notes (Signed)
Patient placed back on full support. Still very sleeping and periods of apnea and low RR per RN when not stimulated. Rt will attempt again at a later time

## 2018-07-28 NOTE — Progress Notes (Addendum)
CTS PM Rounds  S/p AVR A-pacing Too sedated to wean from vent  Not bleeding BP stable 6 hr lab K+ 5.7- will give kayexalate and recheck

## 2018-07-29 ENCOUNTER — Inpatient Hospital Stay (HOSPITAL_COMMUNITY): Payer: Medicare Other

## 2018-07-29 ENCOUNTER — Encounter (HOSPITAL_COMMUNITY): Payer: Self-pay | Admitting: Thoracic Surgery (Cardiothoracic Vascular Surgery)

## 2018-07-29 LAB — GLUCOSE, CAPILLARY
GLUCOSE-CAPILLARY: 119 mg/dL — AB (ref 70–99)
GLUCOSE-CAPILLARY: 120 mg/dL — AB (ref 70–99)
Glucose-Capillary: 116 mg/dL — ABNORMAL HIGH (ref 70–99)
Glucose-Capillary: 114 mg/dL — ABNORMAL HIGH (ref 70–99)
Glucose-Capillary: 106 mg/dL — ABNORMAL HIGH (ref 70–99)

## 2018-07-29 LAB — CREATININE, SERUM
Creatinine, Ser: 10.4 mg/dL — ABNORMAL HIGH (ref 0.61–1.24)
GFR calc Af Amer: 5 mL/min — ABNORMAL LOW (ref >60)
GFR, EST NON AFRICAN AMERICAN: 5 mL/min — AB (ref >60)

## 2018-07-29 LAB — CBC
HCT: 29.2 % — ABNORMAL LOW (ref 39.0–52.0)
HEMATOCRIT: 27.6 % — AB (ref 39.0–52.0)
HEMOGLOBIN: 8.5 g/dL — AB (ref 13.0–17.0)
Hemoglobin: 9 g/dL — ABNORMAL LOW (ref 13.0–17.0)
MCH: 29.7 pg (ref 26.0–34.0)
MCH: 29.4 pg (ref 26.0–34.0)
MCHC: 30.8 g/dL (ref 30.0–36.0)
MCHC: 30.8 g/dL (ref 30.0–36.0)
MCV: 96.4 fL (ref 80.0–100.0)
MCV: 95.5 fL (ref 80.0–100.0)
NRBC: 0 % (ref 0.0–0.2)
PLATELETS: 113 10*3/uL — AB (ref 150–400)
Platelets: 106 10*3/uL — ABNORMAL LOW (ref 150–400)
RBC: 3.03 MIL/uL — AB (ref 4.22–5.81)
RBC: 2.89 MIL/uL — AB (ref 4.22–5.81)
RDW: 17.5 % — ABNORMAL HIGH (ref 11.5–15.5)
RDW: 17.3 % — ABNORMAL HIGH (ref 11.5–15.5)
WBC: 10.7 10*3/uL — AB (ref 4.0–10.5)
WBC: 12 10*3/uL — AB (ref 4.0–10.5)
nRBC: 0 % (ref 0.0–0.2)

## 2018-07-29 LAB — POCT I-STAT 3, ART BLOOD GAS (G3+)
ACID-BASE DEFICIT: 5 mmol/L — AB (ref 0.0–2.0)
Acid-base deficit: 5 mmol/L — ABNORMAL HIGH (ref 0.0–2.0)
BICARBONATE: 20.5 mmol/L (ref 20.0–28.0)
Bicarbonate: 21.3 mmol/L (ref 20.0–28.0)
O2 SAT: 98 %
O2 Saturation: 92 %
PCO2 ART: 38.7 mmHg (ref 32.0–48.0)
PCO2 ART: 42.1 mmHg (ref 32.0–48.0)
PH ART: 7.314 — AB (ref 7.350–7.450)
PO2 ART: 107 mmHg (ref 83.0–108.0)
PO2 ART: 67 mmHg — AB (ref 83.0–108.0)
Patient temperature: 37.2
Patient temperature: 37.3
TCO2: 22 mmol/L (ref 22–32)
TCO2: 23 mmol/L (ref 22–32)
pH, Arterial: 7.333 — ABNORMAL LOW (ref 7.350–7.450)

## 2018-07-29 LAB — POCT I-STAT, CHEM 8
BUN: 64 mg/dL — AB (ref 8–23)
Calcium, Ion: 1.18 mmol/L (ref 1.15–1.40)
Chloride: 103 mmol/L (ref 98–111)
Creatinine, Ser: 10.6 mg/dL — ABNORMAL HIGH (ref 0.61–1.24)
Glucose, Bld: 105 mg/dL — ABNORMAL HIGH (ref 70–99)
HEMATOCRIT: 27 % — AB (ref 39.0–52.0)
HEMOGLOBIN: 9.2 g/dL — AB (ref 13.0–17.0)
Potassium: 5.4 mmol/L — ABNORMAL HIGH (ref 3.5–5.1)
SODIUM: 135 mmol/L (ref 135–145)
TCO2: 21 mmol/L — AB (ref 22–32)

## 2018-07-29 LAB — BASIC METABOLIC PANEL
ANION GAP: 11 (ref 5–15)
BUN: 54 mg/dL — AB (ref 8–23)
CO2: 20 mmol/L — ABNORMAL LOW (ref 22–32)
Calcium: 8.6 mg/dL — ABNORMAL LOW (ref 8.9–10.3)
Chloride: 106 mmol/L (ref 98–111)
Creatinine, Ser: 9.89 mg/dL — ABNORMAL HIGH (ref 0.61–1.24)
GFR calc Af Amer: 6 mL/min — ABNORMAL LOW (ref >60)
GFR calc non Af Amer: 5 mL/min — ABNORMAL LOW (ref >60)
GLUCOSE: 124 mg/dL — AB (ref 70–99)
POTASSIUM: 5.5 mmol/L — AB (ref 3.5–5.1)
SODIUM: 137 mmol/L (ref 135–145)

## 2018-07-29 LAB — MAGNESIUM
Magnesium: 1.6 mg/dL — ABNORMAL LOW (ref 1.7–2.4)
Magnesium: 1.7 mg/dL (ref 1.7–2.4)

## 2018-07-29 MED ORDER — HYDRALAZINE HCL 20 MG/ML IJ SOLN
10.0000 mg | INTRAMUSCULAR | Status: DC | PRN
  Administered 2018-07-29 – 2018-08-15 (×14): 10 mg via INTRAVENOUS
  Filled 2018-07-29 (×16): qty 1

## 2018-07-29 MED ORDER — CARVEDILOL 25 MG PO TABS
25.0000 mg | ORAL_TABLET | Freq: Two times a day (BID) | ORAL | Status: DC
  Administered 2018-07-30: 25 mg via ORAL
  Filled 2018-07-29: qty 1

## 2018-07-29 MED ORDER — ENOXAPARIN SODIUM 30 MG/0.3ML ~~LOC~~ SOLN
30.0000 mg | Freq: Every day | SUBCUTANEOUS | Status: DC
  Administered 2018-07-29: 30 mg via SUBCUTANEOUS
  Filled 2018-07-29: qty 0.3

## 2018-07-29 MED ORDER — INSULIN ASPART 100 UNIT/ML ~~LOC~~ SOLN
0.0000 [IU] | SUBCUTANEOUS | Status: DC
  Administered 2018-07-29: 2 [IU] via SUBCUTANEOUS

## 2018-07-29 MED ORDER — CARVEDILOL 12.5 MG PO TABS
12.5000 mg | ORAL_TABLET | Freq: Two times a day (BID) | ORAL | Status: DC
  Administered 2018-07-29 (×2): 12.5 mg via ORAL
  Filled 2018-07-29 (×2): qty 1

## 2018-07-29 MED ORDER — HYDRALAZINE HCL 50 MG PO TABS
100.0000 mg | ORAL_TABLET | Freq: Three times a day (TID) | ORAL | Status: DC
  Administered 2018-07-29 – 2018-08-20 (×54): 100 mg via ORAL
  Filled 2018-07-29 (×57): qty 2

## 2018-07-29 MED ORDER — LACOSAMIDE 50 MG PO TABS
150.0000 mg | ORAL_TABLET | Freq: Two times a day (BID) | ORAL | Status: DC
  Administered 2018-07-29 – 2018-08-20 (×42): 150 mg via ORAL
  Filled 2018-07-29 (×42): qty 3

## 2018-07-29 MED ORDER — ARTIFICIAL TEARS OPHTHALMIC OINT
TOPICAL_OINTMENT | OPHTHALMIC | Status: DC | PRN
  Administered 2018-07-28: 1 via OPHTHALMIC

## 2018-07-29 MED ORDER — AMLODIPINE BESYLATE 10 MG PO TABS
10.0000 mg | ORAL_TABLET | Freq: Every day | ORAL | Status: DC
  Administered 2018-07-29: 10 mg via ORAL
  Filled 2018-07-29: qty 1

## 2018-07-29 MED ORDER — CLONIDINE HCL 0.2 MG PO TABS
0.3000 mg | ORAL_TABLET | Freq: Two times a day (BID) | ORAL | Status: DC
  Administered 2018-07-29 (×3): 0.3 mg via ORAL
  Filled 2018-07-29 (×3): qty 1

## 2018-07-29 MED FILL — Lidocaine HCl(Cardiac) IV PF Soln Pref Syr 100 MG/5ML (2%): INTRAVENOUS | Qty: 5 | Status: AC

## 2018-07-29 MED FILL — Calcium Chloride Inj 10%: INTRAVENOUS | Qty: 10 | Status: AC

## 2018-07-29 MED FILL — Dexmedetomidine HCl in NaCl 0.9% IV Soln 400 MCG/100ML: INTRAVENOUS | Qty: 100 | Status: AC

## 2018-07-29 MED FILL — Mannitol IV Soln 20%: INTRAVENOUS | Qty: 500 | Status: AC

## 2018-07-29 MED FILL — Magnesium Sulfate Inj 50%: INTRAMUSCULAR | Qty: 10 | Status: AC

## 2018-07-29 MED FILL — Sodium Bicarbonate IV Soln 8.4%: INTRAVENOUS | Qty: 50 | Status: AC

## 2018-07-29 MED FILL — Heparin Sodium (Porcine) Inj 1000 Unit/ML: INTRAMUSCULAR | Qty: 2500 | Status: AC

## 2018-07-29 MED FILL — Electrolyte-R (PH 7.4) Solution: INTRAVENOUS | Qty: 3000 | Status: AC

## 2018-07-29 MED FILL — Sodium Chloride IV Soln 0.9%: INTRAVENOUS | Qty: 3000 | Status: AC

## 2018-07-29 MED FILL — Heparin Sodium (Porcine) Inj 1000 Unit/ML: INTRAMUSCULAR | Qty: 30 | Status: AC

## 2018-07-29 MED FILL — Heparin Sodium (Porcine) Inj 1000 Unit/ML: INTRAMUSCULAR | Qty: 10 | Status: AC

## 2018-07-29 MED FILL — Potassium Chloride Inj 2 mEq/ML: INTRAVENOUS | Qty: 40 | Status: AC

## 2018-07-29 NOTE — Addendum Note (Signed)
Addendum  created 07/29/18 1214 by Orlie Dakin, CRNA   Intraprocedure Meds edited

## 2018-07-29 NOTE — Discharge Instructions (Signed)
Aortic Valve Replacement, Care After °Refer to this sheet in the next few weeks. These instructions provide you with information about caring for yourself after your procedure. Your health care provider may also give you more specific instructions. Your treatment has been planned according to current medical practices, but problems sometimes occur. Call your health care provider if you have any problems or questions after your procedure. °What can I expect after the procedure? °After the procedure, it is common to have: °· Pain around your incision area. °· A small amount of blood or clear fluid coming from your incision. ° °Follow these instructions at home: °Eating and drinking ° °· Follow instructions from your health care provider about eating or drinking restrictions. °? Limit alcohol intake to no more than 1 drink per day for nonpregnant women and 2 drinks per day for men. One drink equals 12 oz of beer, 5 oz of wine, or 1½ oz of hard liquor. °? Limit how much caffeine you drink. Caffeine can affect your heart's rate and rhythm. °· Drink enough fluid to keep your urine clear or pale yellow. °· Eat a heart-healthy diet. This should include plenty of fresh fruits and vegetables. If you eat meat, it should be lean cuts. Avoid foods that are: °? High in salt, saturated fat, or sugar. °? Canned or highly processed. °? Fried. °Activity °· Return to your normal activities as told by your health care provider. Ask your health care provider what activities are safe for you. °· Exercise regularly once you have recovered, as told by your health care provider. °· Avoid sitting for more than 2 hours at a time without moving. Get up and move around at least once every 1-2 hours. This helps to prevent blood clots in the legs. °· Do not lift anything that is heavier than 10 lb (4.5 kg) until your health care provider approves. °· Avoid pushing or pulling things with your arms until your health care provider approves. This  includes pulling on handrails to help you climb stairs. °Incision care ° °· Follow instructions from your health care provider about how to take care of your incision. Make sure you: °? Wash your hands with soap and water before you change your bandage (dressing). If soap and water are not available, use hand sanitizer. °? Change your dressing as told by your health care provider. °? Leave stitches (sutures), skin glue, or adhesive strips in place. These skin closures may need to stay in place for 2 weeks or longer. If adhesive strip edges start to loosen and curl up, you may trim the loose edges. Do not remove adhesive strips completely unless your health care provider tells you to do that. °· Check your incision area every day for signs of infection. Check for: °? More redness, swelling, or pain. °? More fluid or blood. °? Warmth. °? Pus or a bad smell. °Medicines °· Take over-the-counter and prescription medicines only as told by your health care provider. °· If you were prescribed an antibiotic medicine, take it as told by your health care provider. Do not stop taking the antibiotic even if you start to feel better. °Travel °· Avoid airplane travel for as long as told by your health care provider. °· When you travel, bring a list of your medicines and a record of your medical history with you. Carry your medicines with you. °Driving °· Ask your health care provider when it is safe for you to drive. Do not drive until your health   care provider approves. °· Do not drive or operate heavy machinery while taking prescription pain medicine. °Lifestyle ° °· Do not use any tobacco products, such as cigarettes, chewing tobacco, or e-cigarettes. If you need help quitting, ask your health care provider. °· Resume sexual activity as told by your health care provider. Do not use medicines for erectile dysfunction unless your health care provider approves, if this applies. °· Work with your health care provider to keep your  blood pressure and cholesterol under control, and to manage any other heart conditions that you have. °· Maintain a healthy weight. °General instructions °· Do not take baths, swim, or use a hot tub until your health care provider approves. °· Do not strain to have a bowel movement. °· Avoid crossing your legs while sitting down. °· Check your temperature every day for a fever. A fever may be a sign of infection. °· If you are a woman and you plan to become pregnant, talk with your health care provider before you become pregnant. °· Wear compression stockings if your health care provider instructs you to do this. These stockings help to prevent blood clots and reduce swelling in your legs. °· Tell all health care providers who care for you that you have an artificial (prosthetic) aortic valve. If you have or have had heart disease or endocarditis, tell all health care providers about these conditions as well. °· Keep all follow-up visits as told by your health care provider. This is important. °Contact a health care provider if: °· You develop a skin rash. °· You experience sudden, unexplained changes in your weight. °· You have more redness, swelling, or pain around your incision. °· You have more fluid or blood coming from your incision. °· Your incision feels warm to the touch. °· You have pus or a bad smell coming from your incision. °· You have a fever. °Get help right away if: °· You develop chest pain that is different from the pain coming from your incision. °· You develop shortness of breath or difficulty breathing. °· You start to feel light-headed. °These symptoms may represent a serious problem that is an emergency. Do not wait to see if the symptoms will go away. Get medical help right away. Call your local emergency services (911 in the [place] Do not drive yourself to the hospital. °This information is not intended to replace advice given to you by your health care provider. Make sure you discuss any  questions you have with your health care provider. °Document Released: 03/26/2005 Document Revised: 02/13/2016 Document Reviewed: 08/11/2015 °Elsevier Interactive Patient Education © 2017 [employer] ° °

## 2018-07-29 NOTE — Anesthesia Postprocedure Evaluation (Signed)
Anesthesia Post Note  Patient: Jeffrey [name]  Procedure(s) Performed: AORTIC VALVE REPLACEMENT (AVR) using Inspiris Resilia  size 23 Aortic Valve. (N/A Chest) TRANSESOPHAGEAL ECHOCARDIOGRAM (TEE) (N/A )     Patient location during evaluation: SICU Anesthesia Type: General Level of consciousness: sedated Pain management: pain level controlled Vital Signs Assessment: post-procedure vital signs reviewed and stable Respiratory status: patient remains intubated per anesthesia plan Cardiovascular status: stable Postop Assessment: no apparent nausea or vomiting Anesthetic complications: no    Last Vitals:  Vitals:   07/29/18 0615 07/29/18 0630  BP:    Pulse:    Resp: (!) 21 (!) 24  Temp: 37.3 C 37.3 C  SpO2: 100% 100%    Last Pain:  Vitals:   07/29/18 0431  TempSrc:   PainSc: [place]

## 2018-07-29 NOTE — Discharge Summary (Addendum)
Physician Discharge Summary  Patient ID: Jeffrey [name] MRN: [id number] DOB/AGE: 1956-05-14 62 y.o.  Admit date: 07/28/2018 Discharge date: 08/20/2018  Admission Diagnoses: Patient Active Problem List   Diagnosis Date Noted  . Severe aortic insufficiency 07/28/2018    Discharge Diagnoses:  Active Problems:   S/P aortic valve replacement with bioprosthetic valve   Severe aortic insufficiency   Discharged Condition: good  HPI:   Jeffrey [name] is a 62 year old gentleman with a history of end-stage renal disease on hemodialysis, HIV, hepatitis B, aortic valve endocarditis, tubulovillous adenoma of duodenum, hypertension, seizures, hypothyroidism, peripheral arterial disease, and thrombocytopenia.  He was found to have staph bacteremia in November 2017.  In December he was noted to have aortic insufficiency.  He had an echocardiogram which showed severe aortic insufficiency and was referred for surgery.  He was not interested in surgery at that time came back in June 2018.  He wanted to pursue surgery at that point.  A repeat echo was done and showed only moderate aortic insufficiency.  He also had a catheterization which showed a 35 to 40% left main stenosis.  We elected to observe him at that point.  He then did well until this summer.  He presented with shortness of breath and was admitted to the hospital in August.  He had methicillin-resistant staph epidermidis bacteremia.  Transesophageal echocardiography showed a small vegetation and severe aortic insufficiency.  He was treated with vancomycin.  I saw him in September and recommended that he complete his antibiotics prior to surgery.  He is now completed his antibiotics.  He has been afebrile.  He still gets shortness of breath with mild exertion.  He has been able to complete his dialysis sessions.  He is not having any peripheral edema.  He has an appointment with vascular surgery tomorrow to check 1 of his dialysis access  sites.    Hospital Course:   Jeffrey [name] underwent a aortic valve replacement with Dr. Roxan Hockey on 07/28/2018.  He tolerated the procedure well and was transferred to the surgical ICU for continued care.  He was extubated timely manner.  Postop day 1 we discontinued his chest tube.  We worked on pain control.  We discontinued his swollen and arterial line.  He remained in end-stage renal disease and was hyperkalemic, therefore we recommended the dialysis.  We began to gradually increase his blood pressure medications.We consulted renal to assist with HD. He was transferred out of the ICU on POD 8. He ambulated in the halls. He continued to make slow progress. Sometimes on his HD days he was too tired in the afternoon to work with PT/OT which delayed his progress. He continued to have some episodes of bradycardia therefore we kept his EPW in place. We discontinued his EPW on 11/19 without issue. We starting working on a rehab facility for placement. We continued to titrate his blood pressure medications. We stopped his BB due to bradycardia. He did have some incisional chest pain on 11/21. EKG was performed and was stable, no ST changes. We continued to have issues finding both outpatient HD and SNF in close proximity (patient was originally dialyzed at home). He was encouraged to partake in PT and cardiac rehab to continue making progress. His incisional chest pain continued so I reminded him of his sternal precautions and not to push/pull up in bed. He began to feel better and was tolerating room air without shortness of breath. Today, he heading to HD. There is a facility available  for today and he has outpatient HD set up. He is ambulating with assistance, his incisions are healing well, and he is ready for discharge.   Consults: None  Significant Diagnostic Studies:   CLINICAL DATA:  Chest tube.  Post AVR.  EXAM: PORTABLE CHEST 1 VIEW  COMPARISON:  07/28/2018  FINDINGS: Previous median  sternotomy and AVR. Two thoracic drains over the mid and left chest unchanged. Left IJ Swan-Ganz catheter has tip over the proximally and right pulmonary artery. Lungs are adequately inflated demonstrate persistent opacification over the left mid to lower lung likely effusion with associated atelectasis. Mild hazy opacification of the perihilar markings unchanged likely mild vascular congestion. Mild stable cardiomegaly as remainder of the exam is unchanged.  IMPRESSION: Stable left base opacification likely effusion with atelectasis. Mild cardiomegaly with mild vascular congestion.  Tubes and lines as described.   Electronically Signed   By: Marin Olp M.D.   On: 07/29/2018 08:33    Treatments:   NAME: Jeffrey [name], Jeffrey [name] MEDICAL RECORD [id number] ACCOUNT [id number] DATE OF BIRTH:03-12-1956 FACILITY: [facility] LOCATION: [facility] PHYSICIAN:STEVEN Chaya Jan, MD  OPERATIVE REPORT  DATE OF PROCEDURE:  07/28/2018  PREOPERATIVE DIAGNOSIS:  Severe aortic insufficiency.  POSTOPERATIVE DIAGNOSIS:  Severe aortic insufficiency.  PROCEDURE:  Median sternotomy, extracorporeal circulation aortic valve replacement with Edwards Inspiris Resilia bovine pericardial valve (model #11500A, serial# [id number].  SURGEON:  Modesto Charon, MD  ASSISTANT:  Lars Pinks, PA-C  ANESTHESIA:  General.  FINDINGS:  Transesophageal echocardiography showed severe aortic insufficiency, preserved left ventricular systolic function.  Post-bypass transesophageal echocardiography showed good function of the prosthetic valve with no paravalvular leaks.   Trileaflet aortic valve with right and noncoronary cusp being normal.  There was a perforation in the left coronary cusp and no vegetations were seen.  Discharge Exam: Blood pressure (!) 141/93, pulse 71, temperature 98.7 F (37.1 C), temperature source Oral, resp. rate 18, height 6' (1.829 m), weight 55.7 kg, SpO2 100 %.    General appearance: alert, cooperative and no distress Heart: sinus brady Lungs: clear to auscultation bilaterally Abdomen: soft, non-tender; bowel sounds normal; no masses,  no organomegaly Extremities: extremities normal, atraumatic, no cyanosis or edema Wound: clean and dry  Disposition: Discharge disposition: 03-Skilled Nursing Facility       Discharge Instructions    Discharge patient   Complete by:  As directed    If facility able to take today, SW assisting with placement issues   Discharge disposition:  03-Skilled Nursing Facility   Discharge patient date:  08/20/2018     Allergies as of 08/20/2018      Reactions   Lisinopril Anaphylaxis, Shortness Of Breath   Throat swelling   Penicillins Anaphylaxis, Other (See Comments)   Childhood allergy Has patient had a PCN reaction causing immediate rash, facial/tongue/throat swelling, SOB or lightheadedness with hypotension: Yes Has patient had a PCN reaction causing severe rash involving mucus membranes or skin necrosis: Unk Has patient had a PCN reaction that required hospitalization: Unk Has patient had a PCN reaction occurring within the last 10 years: No If all of the above answers are "NO", then may proceed with Cephalosporin use.   Fentanyl Other (See Comments)   Lethargy, AMS   Morphine And Related Other (See Comments)   "Not Himself"      Medication List    STOP taking these medications   carvedilol 25 MG tablet Commonly known as:  COREG   cyclobenzaprine 10 MG tablet Commonly known as:  FLEXERIL   epoetin alfa  2000 UNIT/ML injection Commonly known as:  EPOGEN,PROCRIT   ethyl chloride spray   heparin 1000 UNIT/ML injection   hydrOXYzine 25 MG tablet Commonly known as:  ATARAX/VISTARIL   oxyCODONE 5 MG immediate release tablet Commonly known as:  Oxy IR/ROXICODONE   VOLTAREN 1 % Gel Generic drug:  diclofenac sodium     TAKE these medications   acetaminophen 325 MG tablet Commonly known  as:  TYLENOL Take 2 tablets (650 mg total) by mouth every 6 (six) hours as needed for fever, headache, mild pain or moderate pain.   albuterol (2.5 MG/3ML) 0.083% nebulizer solution Commonly known as:  PROVENTIL Take 2.5 mg by nebulization every 6 (six) hours as needed for wheezing or shortness of breath.   ALPRAZolam 0.25 MG tablet Commonly known as:  XANAX Take 0.25 mg by mouth at bedtime as needed for anxiety.   amLODipine 10 MG tablet Commonly known as:  NORVASC Take 1 tablet (10 mg total) by mouth daily.   aspirin 325 MG EC tablet Take 1 tablet (325 mg total) by mouth daily. What changed:    medication strength  how much to take   bictegravir-emtricitabine-tenofovir AF 50-200-25 MG Tabs tablet Commonly known as:  BIKTARVY Take 1 tablet by mouth daily.   calcium acetate 667 MG capsule Commonly known as:  PHOSLO Take 1,334-2,001 mg by mouth See admin instructions. Take 2,001 mg three times a day with each meal and 1,334 mg with each snack   cloNIDine 0.3 MG tablet Commonly known as:  CATAPRES Take 1 tablet (0.3 mg total) by mouth 3 (three) times daily.   COMBIVENT IN Inhale 2 puffs into the lungs every 6 (six) hours as needed (as needed for shortness of breath).   feeding supplement (PRO-STAT SUGAR FREE 64) Liqd Take 30 mLs by mouth 2 (two) times daily.   ferric gluconate 125 mg in sodium chloride 0.9 % 100 mL Inject 125 mg into the vein every Monday, Wednesday, and Friday with hemodialysis. Start taking on:  08/22/2018   hydrALAZINE 100 MG tablet Commonly known as:  APRESOLINE TAKE 1 TABLET(100 MG) BY MOUTH THREE TIMES DAILY What changed:  See the new instructions.   isosorbide mononitrate 20 MG tablet Commonly known as:  ISMO,MONOKET Take 1 tablet (20 mg total) by mouth 2 (two) times daily.   Lacosamide 150 MG Tabs Take 1 tablet (150 mg total) by mouth 2 (two) times daily.   multivitamin Tabs tablet Take 1 tablet by mouth daily.   omeprazole 20 MG  capsule Commonly known as:  PRILOSEC Take 1 capsule (20 mg total) by mouth daily. What changed:  when to take this   traMADol 50 MG tablet Commonly known as:  ULTRAM Take 1-2 tablets (50-100 mg total) by mouth every 12 (twelve) hours as needed for moderate pain.   traZODone 50 MG tablet Commonly known as:  DESYREL TAKE 1 TABLET(50 MG) BY MOUTH AT BEDTIME AS NEEDED FOR SLEEP What changed:  See the new instructions.       Contact information for follow-up providers    Velna Ochs, MD. Call in 1 day(s).   Specialty:  Internal Medicine Contact information: [place] [address] [phone]        Campbell Riches, MD .   Specialty:  Infectious Diseases Contact information: [place] [address] [phone]        Sherren Mocha, MD .   Specialty:  Cardiology Contact information: [address]  [address]  [place] [address] [phone]        [name], MD Follow up.   Specialty:  Cardiothoracic Surgery Why:  Your routine follow-up appointment is on 09/20/2018 at 4pm. Please arrive at 3:30pm for a chest xray located at [facility] which is on the first floor of our building Contact information: [place] [address] [phone]            Contact information for after-discharge care    [facility] .   Service:  Skilled Nursing Contact information: [address] [place] [phone]                 The patient has been discharged on:   1.Beta Blocker:  Yes [ yes  ]                              No   [   ]                              If No, reason:  2.Ace Inhibitor/ARB: Yes [   ]                                     No  [  no  ]                                     If No, reason: allergy  3.Statin:   Yes [   ]                  No  [ no  ]                  If No,  reason: no CAD  4.Shela Commons:  Yes  [  yes ]                  No   [   ]                  If No, reason:  1. Please obtain vital signs at least one time daily 2.Please weigh the patient daily. If he or she continues to gain weight or develops lower extremity edema, contact the office at [phone]. 3. Ambulate patient at least three times daily and please use sternal precautions. Signed: John Giovanni 08/20/2018, 1:11 PM

## 2018-07-29 NOTE — Progress Notes (Signed)
Dr. Roxan Hockey at bedside, updated on pt condition. MD to restart pt home HTN meds.

## 2018-07-29 NOTE — Progress Notes (Signed)
1 Day Post-Op Procedure(s) (LRB): AORTIC VALVE REPLACEMENT (AVR) using Inspiris Resilia  size 23 Aortic Valve. (N/A) TRANSESOPHAGEAL ECHOCARDIOGRAM (TEE) (N/A) Subjective: C/o pain and doesn't feel well  Objective: Vital signs in last 24 hours: Temp:  [95.7 F (35.4 C)-99.3 F (37.4 C)] 99.1 F (37.3 C) (11/08 0630) Pulse Rate:  [80-86] 86 (11/07 1905) Cardiac Rhythm: Atrial paced (11/07 2000) Resp:  [8-26] 24 (11/08 0630) BP: (100-149)/(63-127) 139/121 (11/08 0600) SpO2:  [95 %-100 %] 100 % (11/08 0630) Arterial Line BP: (116-216)/(57-84) 180/66 (11/08 0630) FiO2 (%):  [40 %-50 %] 40 % (11/07 2232) Weight:  [79.2 kg] 79.2 kg (11/08 0500)  Hemodynamic parameters for last 24 hours: PAP: (30-52)/(6-27) 32/7 CO:  [4.5 L/min-6.6 L/min] 6.6 L/min CI:  [2.4 L/min/m2-3.5 L/min/m2] 3.5 L/min/m2  Intake/Output from previous day: 11/07 0701 - 11/08 0700 In: 1751 [I.V.:2411; Blood:675; NG/GT:240; IV Piggyback:550] Out: 0258 [Blood:700; Chest NIDP:824] Intake/Output this shift: No intake/output data recorded.  General appearance: alert and uncooperative Neurologic: nonfocal Heart: regular rate and rhythm Lungs: diminished breath sounds bibasilar Abdomen: normal findings: soft, non-tender  Lab Results: Recent Labs    07/28/18 1848 07/28/18 1856 07/29/18 0415  WBC 8.3  --  10.7*  HGB 9.4* 9.5* 9.0*  HCT 31.6* 28.0* 29.2*  PLT 101*  --  113*   BMET:  Recent Labs    07/28/18 2213 07/29/18 0415  NA 132* 137  K 5.5* 5.5*  CL 101 106  CO2 19* 20*  GLUCOSE 123* 124*  BUN 52* 54*  CREATININE 9.37* 9.89*  CALCIUM 8.2* 8.6*    PT/INR:  Recent Labs    07/28/18 1238  LABPROT 17.8*  INR 1.48   ABG    Component Value Date/Time   PHART 7.333 (L) 07/29/2018 0439   HCO3 20.5 07/29/2018 0439   TCO2 22 07/29/2018 0439   ACIDBASEDEF 5.0 (H) 07/29/2018 0439   O2SAT 92.0 07/29/2018 0439   CBG (last 3)  Recent Labs    07/28/18 2002 07/29/18 0027 07/29/18 0742  GLUCAP  119* 116* 114*    Assessment/Plan: S/P Procedure(s) (LRB): AORTIC VALVE REPLACEMENT (AVR) using Inspiris Resilia  size 23 Aortic Valve. (N/A) TRANSESOPHAGEAL ECHOCARDIOGRAM (TEE) (N/A) -C/o pain CV- stable- dc swan and A line  Gradually resume BP meds RESP- IS RENAL- ESRD, hyperkalemia- likely will need HD today ENDO- CBG well controlled Dc CT Cardiac rehab   LOS: 1 day    [name] 07/29/2018

## 2018-07-29 NOTE — Progress Notes (Signed)
      [address]       [place] [address]             [phone]      Pain better after tubes removed BP (!) 155/138   Pulse 86   Temp 98.7 F (37.1 C)   Resp (!) 26   Ht 6' (1.829 m)   Wt 79.2 kg   SpO2 93%   BMI 23.68 kg/m   Intake/Output Summary (Last 24 hours) at 07/29/2018 1832 Last data filed at 07/29/2018 1800 Gross per 24 hour  Intake 1463.14 ml  Output 175 ml  Net 1288.14 ml  k=5.4  Still on high doses of nitro Will increase coreg and restart PO hydralazine Renal notified patient in house. Will need HD tomorrow  Remo Lipps C. Roxan Hockey, MD [facility] [phone]

## 2018-07-29 NOTE — Progress Notes (Signed)
Pt BP sustaining 221'T-981'S systolic MAP 25'G. Nitro gtt at 100. PRN 5 mg lopressor given. Pt reports pain is manageable at this time. Dr. Prescott Gum notified. Orders tor restart Clonidine and PRN hydralazine added for MAP >90. Will follow orders and continue to monitor.

## 2018-07-30 ENCOUNTER — Inpatient Hospital Stay (HOSPITAL_COMMUNITY): Payer: Medicare Other

## 2018-07-30 LAB — CBC
HCT: 26.4 % — ABNORMAL LOW (ref 39.0–52.0)
HCT: 26.1 % — ABNORMAL LOW (ref 39.0–52.0)
HEMATOCRIT: 25.6 % — AB (ref 39.0–52.0)
Hemoglobin: 7.8 g/dL — ABNORMAL LOW (ref 13.0–17.0)
Hemoglobin: 8 g/dL — ABNORMAL LOW (ref 13.0–17.0)
Hemoglobin: 8.2 g/dL — ABNORMAL LOW (ref 13.0–17.0)
MCH: 29.2 pg (ref 26.0–34.0)
MCH: 29.3 pg (ref 26.0–34.0)
MCH: 29.6 pg (ref 26.0–34.0)
MCHC: 30.5 g/dL (ref 30.0–36.0)
MCHC: 30.3 g/dL (ref 30.0–36.0)
MCHC: 31.4 g/dL (ref 30.0–36.0)
MCV: 95.9 fL (ref 80.0–100.0)
MCV: 96.7 fL (ref 80.0–100.0)
MCV: 94.2 fL (ref 80.0–100.0)
NRBC: 0 % (ref 0.0–0.2)
PLATELETS: 81 10*3/uL — AB (ref 150–400)
PLATELETS: 93 10*3/uL — AB (ref 150–400)
Platelets: 89 10*3/uL — ABNORMAL LOW (ref 150–400)
RBC: 2.67 MIL/uL — AB (ref 4.22–5.81)
RBC: 2.73 MIL/uL — ABNORMAL LOW (ref 4.22–5.81)
RBC: 2.77 MIL/uL — AB (ref 4.22–5.81)
RDW: 17.2 % — ABNORMAL HIGH (ref 11.5–15.5)
RDW: 17.1 % — ABNORMAL HIGH (ref 11.5–15.5)
RDW: 16.8 % — AB (ref 11.5–15.5)
WBC: 11.6 10*3/uL — ABNORMAL HIGH (ref 4.0–10.5)
WBC: 12.1 10*3/uL — ABNORMAL HIGH (ref 4.0–10.5)
WBC: 9.8 10*3/uL (ref 4.0–10.5)
nRBC: 0 % (ref 0.0–0.2)
nRBC: 0 % (ref 0.0–0.2)

## 2018-07-30 LAB — RENAL FUNCTION PANEL
ALBUMIN: 2.6 g/dL — AB (ref 3.5–5.0)
ANION GAP: 11 (ref 5–15)
Albumin: 2.7 g/dL — ABNORMAL LOW (ref 3.5–5.0)
Anion gap: 14 (ref 5–15)
BUN: 69 mg/dL — ABNORMAL HIGH (ref 8–23)
BUN: 28 mg/dL — AB (ref 8–23)
CALCIUM: 7.8 mg/dL — AB (ref 8.9–10.3)
CO2: 19 mmol/L — ABNORMAL LOW (ref 22–32)
CO2: 27 mmol/L (ref 22–32)
CREATININE: 5.76 mg/dL — AB (ref 0.61–1.24)
Calcium: 8.4 mg/dL — ABNORMAL LOW (ref 8.9–10.3)
Chloride: 100 mmol/L (ref 98–111)
Chloride: 98 mmol/L (ref 98–111)
Creatinine, Ser: 10.92 mg/dL — ABNORMAL HIGH (ref 0.61–1.24)
GFR calc Af Amer: 5 mL/min — ABNORMAL LOW (ref >60)
GFR calc Af Amer: 11 mL/min — ABNORMAL LOW (ref >60)
GFR calc non Af Amer: 4 mL/min — ABNORMAL LOW (ref >60)
GFR calc non Af Amer: 9 mL/min — ABNORMAL LOW (ref >60)
GLUCOSE: 87 mg/dL (ref 70–99)
Glucose, Bld: 104 mg/dL — ABNORMAL HIGH (ref 70–99)
PHOSPHORUS: 3.9 mg/dL (ref 2.5–4.6)
Phosphorus: 6.1 mg/dL — ABNORMAL HIGH (ref 2.5–4.6)
Potassium: 5.4 mmol/L — ABNORMAL HIGH (ref 3.5–5.1)
Potassium: 3.5 mmol/L (ref 3.5–5.1)
SODIUM: 136 mmol/L (ref 135–145)
Sodium: 133 mmol/L — ABNORMAL LOW (ref 135–145)

## 2018-07-30 LAB — GLUCOSE, CAPILLARY
GLUCOSE-CAPILLARY: 104 mg/dL — AB (ref 70–99)
GLUCOSE-CAPILLARY: 80 mg/dL (ref 70–99)
Glucose-Capillary: 100 mg/dL — ABNORMAL HIGH (ref 70–99)
Glucose-Capillary: 100 mg/dL — ABNORMAL HIGH (ref 70–99)
Glucose-Capillary: 99 mg/dL (ref 70–99)
Glucose-Capillary: 93 mg/dL (ref 70–99)

## 2018-07-30 LAB — BASIC METABOLIC PANEL
ANION GAP: 14 (ref 5–15)
BUN: 67 mg/dL — ABNORMAL HIGH (ref 8–23)
CALCIUM: 8.4 mg/dL — AB (ref 8.9–10.3)
CHLORIDE: 101 mmol/L (ref 98–111)
CO2: 20 mmol/L — AB (ref 22–32)
CREATININE: 10.62 mg/dL — AB (ref 0.61–1.24)
GFR calc Af Amer: 5 mL/min — ABNORMAL LOW (ref >60)
GFR calc non Af Amer: 4 mL/min — ABNORMAL LOW (ref >60)
Glucose, Bld: 105 mg/dL — ABNORMAL HIGH (ref 70–99)
Potassium: 5.2 mmol/L — ABNORMAL HIGH (ref 3.5–5.1)
Sodium: 135 mmol/L (ref 135–145)

## 2018-07-30 LAB — IRON AND TIBC
IRON: 20 ug/dL — AB (ref 45–182)
SATURATION RATIOS: 20 % (ref 17.9–39.5)
TIBC: 99 ug/dL — AB (ref 250–450)
UIBC: 79 ug/dL

## 2018-07-30 LAB — FERRITIN: Ferritin: 799 ng/mL — ABNORMAL HIGH (ref 24–336)

## 2018-07-30 MED ORDER — TRAZODONE HCL 50 MG PO TABS
50.0000 mg | ORAL_TABLET | Freq: Every evening | ORAL | Status: DC | PRN
  Administered 2018-07-30 – 2018-08-16 (×17): 50 mg via ORAL
  Filled 2018-07-30 (×18): qty 1

## 2018-07-30 MED ORDER — ISOSORBIDE MONONITRATE 20 MG PO TABS
20.0000 mg | ORAL_TABLET | Freq: Two times a day (BID) | ORAL | Status: DC
  Administered 2018-07-30 – 2018-08-19 (×36): 20 mg via ORAL
  Filled 2018-07-30 (×44): qty 1

## 2018-07-30 MED ORDER — AMLODIPINE BESYLATE 10 MG PO TABS
10.0000 mg | ORAL_TABLET | Freq: Every day | ORAL | Status: DC
  Administered 2018-07-30 – 2018-08-19 (×20): 10 mg via ORAL
  Filled 2018-07-30 (×20): qty 1

## 2018-07-30 MED ORDER — CARVEDILOL 12.5 MG PO TABS
12.5000 mg | ORAL_TABLET | Freq: Two times a day (BID) | ORAL | Status: DC
  Administered 2018-07-30 – 2018-08-01 (×4): 12.5 mg via ORAL
  Filled 2018-07-30 (×5): qty 1

## 2018-07-30 MED ORDER — DARBEPOETIN ALFA 200 MCG/0.4ML IJ SOSY
200.0000 ug | PREFILLED_SYRINGE | INTRAMUSCULAR | Status: DC
  Administered 2018-07-30: 200 ug via INTRAVENOUS
  Filled 2018-07-30: qty 0.4

## 2018-07-30 MED ORDER — INSULIN ASPART 100 UNIT/ML ~~LOC~~ SOLN: 0.0000 [IU] | Freq: Three times a day (TID) | SUBCUTANEOUS | Status: DC

## 2018-07-30 MED ORDER — SODIUM CHLORIDE 0.9 % IV SOLN: 100.0000 mL | INTRAVENOUS | Status: DC | PRN

## 2018-07-30 MED ORDER — LIDOCAINE-PRILOCAINE 2.5-2.5 % EX CREA: 1.0000 "application " | TOPICAL_CREAM | CUTANEOUS | Status: DC | PRN

## 2018-07-30 MED ORDER — PENTAFLUOROPROP-TETRAFLUOROETH EX AERO
1.0000 "application " | INHALATION_SPRAY | CUTANEOUS | Status: DC | PRN
  Administered 2018-08-12: 2 via TOPICAL

## 2018-07-30 MED ORDER — CLONIDINE HCL 0.2 MG PO TABS
0.3000 mg | ORAL_TABLET | Freq: Three times a day (TID) | ORAL | Status: DC
  Administered 2018-07-30 – 2018-08-20 (×52): 0.3 mg via ORAL
  Filled 2018-07-30 (×53): qty 1

## 2018-07-30 MED ORDER — CALCIUM ACETATE (PHOS BINDER) 667 MG PO CAPS
2001.0000 mg | ORAL_CAPSULE | Freq: Three times a day (TID) | ORAL | Status: DC
  Administered 2018-07-30 – 2018-08-12 (×25): 2001 mg via ORAL
  Administered 2018-08-12: 1667 mg via ORAL
  Administered 2018-08-13 – 2018-08-20 (×15): 2001 mg via ORAL
  Filled 2018-07-30 (×45): qty 3

## 2018-07-30 MED ORDER — PRO-STAT SUGAR FREE PO LIQD
30.0000 mL | Freq: Two times a day (BID) | ORAL | Status: DC
  Administered 2018-07-31 – 2018-08-20 (×30): 30 mL via ORAL
  Filled 2018-07-30 (×36): qty 30

## 2018-07-30 MED ORDER — LIDOCAINE HCL (PF) 1 % IJ SOLN: 5.0000 mL | INTRAMUSCULAR | Status: DC | PRN

## 2018-07-30 NOTE — Progress Notes (Signed)
      [address]       [place] [address]             [phone]      POD # 2 AVR  Just finished HD  BP (!) 144/72   Pulse 76   Temp 98 F (36.7 C) (Oral)   Resp 16   Ht 6' (1.829 m)   Wt 78.3 kg   SpO2 95%   BMI 23.41 kg/m   Intake/Output Summary (Last 24 hours) at 07/30/2018 1815 Last data filed at 07/30/2018 1735 Gross per 24 hour  Intake 700.29 ml  Output 3500 ml  Net -2799.71 ml   Remo Lipps C. Roxan Hockey, MD [facility] [phone]

## 2018-07-30 NOTE — Plan of Care (Signed)
RN continuing to give PRN BP meds around the clock to achieve MAP <90. Plan is for pt to receive HD today.  Problem: Urinary Elimination: Goal: Ability to achieve and maintain adequate renal perfusion and functioning will improve Outcome: Not Progressing   Problem: Cardiac: Goal: Will achieve and/or maintain hemodynamic stability Outcome: Progressing   Problem: Clinical Measurements: Goal: Postoperative complications will be avoided or minimized Outcome: Progressing   Problem: Respiratory: Goal: Respiratory status will improve Outcome: Progressing

## 2018-07-30 NOTE — Progress Notes (Signed)
Patient was seen at bedside. Chart reviewed. Plan for dialysis today. Full consult note to follow.  Lawson Radar, MD [facility].

## 2018-07-30 NOTE — Consult Note (Signed)
[facility] Renal Consultation Note    Indication for Consultation:  Management of ESRD/hemodialysis; anemia, hypertension/volume and secondary hyperparathyroidism PCP:  HPI: Jeffrey [name] is a 62 y.o. male with ESRD on home hemodialysis through [facility], followed by Dr. Holley Raring. PMH of recurrent aortic valve endocarditis with severe AI, HIV, HTN, PAD, CAD, hepatitis B, seizures, AOCD, SHPT. Last home hemodialysis 07/27/2018  He was admitted 07/28/2018 per Dr. Roxan Hockey for aortic valve replacement. He is currently post op day # 2. Post op interval complicated by mild hyperkalemia K+ 5.5 for which he received 45 gram Kayexalate and uncontrolled HTN. He was started on NTG gtt for hypertension-titrated to 100 mcg and home meds have been resumed. We were notified of admission this AM and will initiate hemodialysis on MWFS schedule while in hospital as used last admission.   He has no complaints but appears confused-oriented to self and place. Very slow verbal response. Chest tubes and Aline has been removed. He is currently in SB with HR in 32s. Brother, who is caregiver at bedside.   Past Medical History:  Diagnosis Date  . Anemia   . Anxiety   . Aortic valve stenosis   . Arthritis   . Asthma    per pt hx  . Dyspnea   . End stage renal disease on home HD 07/10/2011   Started HD in September 2012 at [facility] with a tunneled HD catheter, now on home HD with N[facility]Stage. Dialyzing through AVF L lower arm with buttonhole technique as of mid 2014. His brother does the HD treatments at home.  They are roommates for 23 years.  The brother works 3rd shift and gets off about 8am and then puts Jeffrey [name] on HD in the morning after getting home. Most of the time he does HD about 4 times a week, for about 4 hours per treatment. Cause of ESRD was HTN according to patient. He says he let his health go and ending up with complications, and that he didn't like seeing doctors in those days.  He  says he was diagnosed with severe HTN when he lived in [place] in his 24's.   . Hepatitis    Hepatitis B  . Hepatitis B carrier ([place])   . HIV infection ([place])   . Hypertension   . Hyperthyroidism    normal now  . Pneumonia several yrs ago  . Seizure ([place])   . Seizures ([place]) 06/02/2011   had a mild one in Sept. 2019  . Sleep apnea    to start Cpap soon  . Thrombocytopenia ([place])    Past Surgical History:  Procedure Laterality Date  . AORTIC VALVE REPLACEMENT N/A 07/28/2018   Procedure: AORTIC VALVE REPLACEMENT (AVR) using Inspiris Resilia  size 23 Aortic Valve.;  Surgeon: [name], MD;  Location: [place];  Service: Open Heart Surgery;  Laterality: N/A;  . AV FISTULA PLACEMENT  06/02/11   Left radiocephalic AVF  . BIOPSY  02/17/2018   Procedure: BIOPSY;  Surgeon: Milus Banister, MD;  Location: [facility];  Service: Endoscopy;;  . COLONOSCOPY    . COLONOSCOPY WITH PROPOFOL N/A 01/27/2018   Procedure: COLONOSCOPY WITH PROPOFOL;  Surgeon: [name], MD;  Location: [facility] ENDOSCOPY;  Service: Gastroenterology;  Laterality: N/A;  . CORONARY ANGIOPLASTY    . ERCP  03/21/2018   AT [place]  . ESOPHAGOGASTRODUODENOSCOPY N/A 02/17/2018   Procedure: ESOPHAGOGASTRODUODENOSCOPY (EGD);  Surgeon: Milus Banister, MD;  Location: [facility];  Service:  Endoscopy;  Laterality: N/A;  . ESOPHAGOGASTRODUODENOSCOPY (EGD) WITH PROPOFOL N/A 01/27/2018   Procedure: ESOPHAGOGASTRODUODENOSCOPY (EGD) WITH PROPOFOL;  Surgeon: [name], MD;  Location: [facility] ENDOSCOPY;  Service: Gastroenterology;  Laterality: N/A;  . ESOPHAGOGASTRODUODENOSCOPY (EGD) WITH PROPOFOL N/A 04/03/2018   Procedure: ESOPHAGOGASTRODUODENOSCOPY (EGD) WITH PROPOFOL;  Surgeon: Clarene Essex, MD;  Location: [place];  Service: Endoscopy;  Laterality: N/A;  . EUS N/A 02/17/2018   Procedure: UPPER ENDOSCOPIC ULTRASOUND (EUS) RADIAL;  Surgeon: Milus Banister, MD;  Location: [facility];  Service: Endoscopy;  Laterality: N/A;   . fistulaogram     x 2 last 2 years  . IR DIALY SHUNT INTRO NEEDLE/INTRACATH INITIAL W/IMG LEFT Left 04/20/2018  . IR FLUORO GUIDE CV LINE RIGHT  09/16/2017  . IR US GUIDE VASC ACCESS RIGHT  09/16/2017  . IRRIGATION AND DEBRIDEMENT KNEE Left 09/15/2017   Procedure: IRRIGATION AND DEBRIDEMENT KNEE; arthroscopic clean out;  Surgeon: [name], MD;  Location: [facility];  Service: Orthopedics;  Laterality: Left;  . OTHER SURGICAL HISTORY     removal temporary HD catheter   . REVISON OF ARTERIOVENOUS FISTULA Left 10/10/2015   Procedure: REVISON OF LEFT RADIOCEPHALIC ARTERIOVENOUS FISTULA;  Surgeon: Angelia Mould, MD;  Location: [place];  Service: Vascular;  Laterality: Left;  . REVISON OF ARTERIOVENOUS FISTULA Left 02/07/2016   Procedure: REPAIR OF PSEUDO-ANEUREYSM OF LEFT ARM  ARTERIOVENOUS FISTULA;  Surgeon: Angelia Mould, MD;  Location: [place];  Service: Vascular;  Laterality: Left;  . REVISON OF ARTERIOVENOUS FISTULA Left 6/60/6004   Procedure: PLICATION OF LEFT ARM RADIOCEPHALIC ARTERIOVENOUS FISTULA PSEUDOANEURYSM;  Surgeon: Angelia Mould, MD;  Location: [place];  Service: Vascular;  Laterality: Left;  . REVISON OF ARTERIOVENOUS FISTULA Left 07/04/2018   Procedure: REVISION OF ARTERIOVENOUS RADIOCEPHALIC FISTULA;  Surgeon: Angelia Mould, MD;  Location: [place];  Service: Vascular;  Laterality: Left;  . RIGHT/LEFT HEART CATH AND CORONARY ANGIOGRAPHY N/A 02/17/2017   Procedure: Right/Left Heart Cath and Coronary Angiography;  Surgeon: Sherren Mocha, MD;  Location: [place] CV LAB;  Service: Cardiovascular;  Laterality: N/A;  . RIGHT/LEFT HEART CATH AND CORONARY ANGIOGRAPHY N/A 07/25/2018   Procedure: RIGHT/LEFT HEART CATH AND CORONARY ANGIOGRAPHY;  Surgeon: Leonie Man, MD;  Location: [place] CV LAB;  Service: Cardiovascular;  Laterality: N/A;  . TEE WITHOUT CARDIOVERSION N/A 09/11/2016   Procedure: TRANSESOPHAGEAL ECHOCARDIOGRAM (TEE);  Surgeon: Dorothy Spark, MD;  Location: [place];  Service: Cardiovascular;  Laterality: N/A;  . TEE WITHOUT CARDIOVERSION N/A 05/05/2018   Procedure: TRANSESOPHAGEAL ECHOCARDIOGRAM (TEE);  Surgeon: Adrian Prows, MD;  Location: [place];  Service: Cardiovascular;  Laterality: N/A;  Prefer after 3:30 PM  . TEE WITHOUT CARDIOVERSION N/A 07/28/2018   Procedure: TRANSESOPHAGEAL ECHOCARDIOGRAM (TEE);  Surgeon: [name], MD;  Location: [place];  Service: Open Heart Surgery;  Laterality: N/A;   Family History  Problem Relation Age of Onset  . Hypertension Mother   . Diabetes Mother   . Cancer Father    Social History:  reports that he quit smoking about 2 years ago. His smoking use included cigarettes. He quit after 10.00 years of use. He has never used smokeless tobacco. He reports that he does not drink alcohol or use drugs. Allergies  Allergen Reactions  . Lisinopril Anaphylaxis and Shortness Of Breath    Throat swelling  . Penicillins Anaphylaxis and Other (See Comments)    Childhood allergy Has patient had a PCN reaction causing immediate rash, facial/tongue/throat swelling, SOB or lightheadedness  with hypotension: Yes Has patient had a PCN reaction causing severe rash involving mucus membranes or skin necrosis: Unk Has patient had a PCN reaction that required hospitalization: Unk Has patient had a PCN reaction occurring within the last 10 years: No If all of the above answers are "NO", then may proceed with Cephalosporin use.   . Fentanyl Other (See Comments)    Lethargy, AMS  . Morphine And Related Other (See Comments)    "Not Himself"   Prior to Admission medications   Medication Sig Start Date End Date Taking? Authorizing Provider  albuterol (PROVENTIL) (2.5 MG/3ML) 0.083% nebulizer solution Take 2.5 mg by nebulization every 6 (six) hours as needed for wheezing or shortness of breath.   Yes [provider]  amLODipine (NORVASC) 10 MG tablet Take 1 tablet (10 mg total) by  mouth daily. 07/20/18 08/19/18 Yes Donne Hazel, MD  aspirin EC 81 MG EC tablet Take 1 tablet (81 mg total) by mouth daily. 07/20/18 08/19/18 Yes Donne Hazel, MD  bictegravir-emtricitabine-tenofovir AF (BIKTARVY) 50-200-25 MG TABS tablet Take 1 tablet by mouth daily. 12/27/17  Yes Campbell Riches, MD  calcium acetate (PHOSLO) 667 MG capsule Take 1,334-2,001 mg by mouth See admin instructions. Take 2,001 mg three times a day with each meal and 1,334 mg with each snack 06/06/15  Yes [provider]  carvedilol (COREG) 25 MG tablet Take 1 tablet (25 mg total) by mouth 2 (two) times daily with a meal. 06/20/18  Yes Velna Ochs, MD  cloNIDine (CATAPRES) 0.3 MG tablet Take 1 tablet (0.3 mg total) by mouth 3 (three) times daily. 07/19/18 08/18/18 Yes Donne Hazel, MD  ethyl chloride spray Apply 1 application topically daily as needed (HD- to numb).  05/20/15  Yes [provider]  heparin 1000 UNIT/ML injection Inject 3,000 Units into the vein 4 (four) times a week.    Yes [provider]  hydrALAZINE (APRESOLINE) 100 MG tablet TAKE 1 TABLET(100 MG) BY MOUTH THREE TIMES DAILY Patient taking differently: Take 100 mg by mouth 3 (three) times daily.  06/13/18  Yes Aldine Contes, MD  isosorbide mononitrate (ISMO,MONOKET) 20 MG tablet Take 1 tablet (20 mg total) by mouth 2 (two) times daily. 07/19/18 08/18/18 Yes Donne Hazel, MD  lacosamide 150 MG TABS Take 1 tablet (150 mg total) by mouth 2 (two) times daily. 07/19/18 08/18/18 Yes Donne Hazel, MD  multivitamin (RENA-VIT) TABS tablet Take 1 tablet by mouth daily.     Yes [provider]  omeprazole (PRILOSEC) 20 MG capsule Take 1 capsule (20 mg total) by mouth daily. Patient taking differently: Take 20 mg by mouth 2 (two) times daily.  01/31/18  Yes [name], MD  oxyCODONE (ROXICODONE) 5 MG immediate release tablet Take 1 tablet (5 mg total) by mouth every 4 (four) hours as needed. 07/04/18  Yes  Angelia Mould, MD  ALPRAZolam Duanne Moron) 0.25 MG tablet Take 0.25 mg by mouth at bedtime as needed for anxiety.     [provider]  cyclobenzaprine (FLEXERIL) 10 MG tablet Take 1 tablet (10 mg total) by mouth at bedtime as needed for muscle spasms. 06/14/18   Candice Camp, MD  epoetin alfa (EPOGEN,PROCRIT) 2000 UNIT/ML injection Inject 2,000 Units into the vein 3 (three) times a week.     [provider]  hydrOXYzine (ATARAX/VISTARIL) 25 MG tablet Take 1 tablet (25 mg total) by mouth 4 (four) times daily as needed for itching. 06/20/18   Velna Ochs,  MD  Ipratropium-Albuterol (COMBIVENT IN) Inhale 2 puffs into the lungs every 6 (six) hours as needed (as needed for shortness of breath).     [provider]  traZODone (DESYREL) 50 MG tablet TAKE 1 TABLET(50 MG) BY MOUTH AT BEDTIME AS NEEDED FOR SLEEP 07/28/18   Campbell Riches, MD  VOLTAREN 1 % GEL APPLY 2 GRAMS EXTERNALLY TO THE AFFECTED AREA FOUR TIMES DAILY Patient taking differently: Apply 2 g topically 4 (four) times daily as needed (pain).  05/09/18   Jean Rosenthal, MD   Current Facility-Administered Medications  Medication Dose Route Frequency Provider Last Rate Last Dose  . 0.9 %  sodium chloride infusion  250 mL Intravenous Continuous Lars Pinks M, PA-C      . 0.9 %  sodium chloride infusion   Intravenous Continuous [name], MD   Stopped at 07/29/18 1036  . 0.9 %  sodium chloride infusion  100 mL Intravenous PRN Valentina Gu, NP      . 0.9 %  sodium chloride infusion  100 mL Intravenous PRN Valentina Gu, NP      . acetaminophen (TYLENOL) tablet 1,000 mg  1,000 mg Oral Q6H Lars Pinks M, PA-C   1,000 mg at 07/30/18 2025   Or  . acetaminophen (TYLENOL) solution 1,000 mg  1,000 mg Per Tube Q6H Tacy Dura, Donielle M, PA-C      . albuterol (PROVENTIL) (2.5 MG/3ML) 0.083% nebulizer solution 2.5 mg  2.5 mg Nebulization Q6H PRN Nani Skillern, PA-C       . ALPRAZolam Duanne Moron) tablet 0.25 mg  0.25 mg Oral QHS PRN Nani Skillern, PA-C   0.25 mg at 07/30/18 0052  . amLODipine (NORVASC) tablet 10 mg  10 mg Oral QHS Rosita Fire, MD      . aspirin EC tablet 325 mg  325 mg Oral Daily Lars Pinks M, [place]   325 mg at 07/29/18 4270   Or  . aspirin chewable tablet 324 mg  324 mg Per Tube Daily Lars Pinks M, PA-C      . bictegravir-emtricitabine-tenofovir AF (BIKTARVY) 50-200-25 MG per tablet 1 tablet  1 tablet Oral Daily Nani Skillern, PA-C   1 tablet at 07/29/18 6237  . bisacodyl (DULCOLAX) EC tablet 10 mg  10 mg Oral Daily Lars Pinks M, PA-C   10 mg at 07/29/18 6283   Or  . bisacodyl (DULCOLAX) suppository 10 mg  10 mg Rectal Daily Lars Pinks M, PA-C      . carvedilol (COREG) tablet 12.5 mg  12.5 mg Oral BID WC [name], MD      . chlorhexidine gluconate (MEDLINE KIT) (PERIDEX) 0.12 % solution 15 mL  15 mL Mouth Rinse BID [name], MD   15 mL at 07/30/18 0801  . Chlorhexidine Gluconate Cloth 2 % PADS 6 each  6 each Topical Daily [name], MD   6 each at 07/29/18 1526  . cloNIDine (CATAPRES) tablet 0.3 mg  0.3 mg Oral TID [name], MD      . docusate sodium (COLACE) capsule 200 mg  200 mg Oral Daily Lars Pinks M, PA-C   200 mg at 07/29/18 1517  . hydrALAZINE (APRESOLINE) injection 10 mg  10 mg Intravenous Q4H PRN Ivin Poot, MD   10 mg at 07/30/18 6160  . hydrALAZINE (APRESOLINE) tablet 100 mg  100 mg Oral TID [name], MD   100 mg at 07/29/18 2153  .  insulin aspart (novoLOG) injection 0-9 Units  0-9 Units Subcutaneous TID WC [name], MD      . isosorbide mononitrate (ISMO,MONOKET) tablet 20 mg  20 mg Oral BID [name], MD      . lacosamide (VIMPAT) tablet 150 mg  150 mg Oral BID [name], MD   150 mg at 07/29/18 2153  . lactated ringers infusion 500 mL  500 mL Intravenous Once  PRN Lars Pinks M, PA-C      . lactated ringers infusion   Intravenous Continuous Zimmerman, Donielle M, PA-C      . lactated ringers infusion   Intravenous Continuous Lars Pinks M, PA-C      . lidocaine (PF) (XYLOCAINE) 1 % injection 5 mL  5 mL Intradermal PRN Valentina Gu, NP      . lidocaine-prilocaine (EMLA) cream 1 application  1 application Topical PRN Valentina Gu, NP      . magnesium sulfate IVPB 4 g 100 mL  4 g Intravenous Once Lars Pinks M, PA-C      . metoprolol tartrate (LOPRESSOR) injection 2.5-5 mg  2.5-5 mg Intravenous Q2H PRN Lars Pinks M, PA-C   5 mg at 07/30/18 3016  . multivitamin (RENA-VIT) tablet 1 tablet  1 tablet Oral Daily Nani Skillern, PA-C   1 tablet at 07/29/18 2153  . nitroGLYCERIN 50 mg in dextrose 5 % 250 mL (0.2 mg/mL) infusion  0-100 mcg/min Intravenous Titrated Lars Pinks M, PA-C 30 mL/hr at 07/30/18 1200 100 mcg/min at 07/30/18 1200  . ondansetron (ZOFRAN) injection 4 mg  4 mg Intravenous Q6H PRN Nani Skillern, PA-C   4 mg at 07/29/18 1203  . oxyCODONE (Oxy IR/ROXICODONE) immediate release tablet 5-10 mg  5-10 mg Oral Q3H PRN Nani Skillern, PA-C   10 mg at 07/29/18 2038  . pantoprazole (PROTONIX) EC tablet 40 mg  40 mg Oral Daily Zimmerman, Donielle M, PA-C      . pentafluoroprop-tetrafluoroeth (GEBAUERS) aerosol 1 application  1 application Topical PRN Valentina Gu, NP      . phenylephrine (NEOSYNEPHRINE) 20-0.9 MG/250ML-% infusion  0-100 mcg/min Intravenous Titrated Nani Skillern, PA-C   Stopped at 07/28/18 1703  . sodium chloride flush (NS) 0.9 % injection 10-40 mL  10-40 mL Intracatheter Q12H [name], MD   10 mL at 07/30/18 0929  . sodium chloride flush (NS) 0.9 % injection 10-40 mL  10-40 mL Intracatheter PRN [name], MD      . sodium chloride flush (NS) 0.9 % injection 3 mL  3 mL Intravenous Q12H Lars Pinks M, PA-C   3 mL at  07/30/18 0930  . sodium chloride flush (NS) 0.9 % injection 3 mL  3 mL Intravenous PRN Lars Pinks M, PA-C      . traMADol Veatrice Bourbon) tablet 50-100 mg  50-100 mg Oral Q12H PRN Lars Pinks M, PA-C   100 mg at 07/29/18 1609  . traZODone (DESYREL) tablet 50 mg  50 mg Oral QHS PRN [name], MD       Facility-Administered Medications Ordered in Other Encounters  Medication Dose Route Frequency Provider Last Rate Last Dose  . etomidate (AMIDATE) injection    Anesthesia Intra-op Myna Bright, CRNA   16 mg at 07/10/18 2031  . rocuronium bromide 10 mg/mL (PF) syringe   Intravenous Anesthesia Intra-op Myna Bright, CRNA   50 mg at 07/10/18 2031   Labs: Basic Metabolic Panel: Recent Labs  Lab 07/28/18 2213 07/29/18 0415  07/29/18 1609 07/30/18 0358 07/30/18 1020  NA 132* 137  --  135 135 133*  K 5.5* 5.5*  --  5.4* 5.2* 5.4*  CL 101 106  --  103 101 100  CO2 19* 20*  --   --  20* 19*  GLUCOSE 123* 124*  --  105* 105* 104*  BUN 52* 54*  --  64* 67* 69*  CREATININE 9.37* 9.89*   < > 10.60* 10.62* 10.92*  CALCIUM 8.2* 8.6*  --   --  8.4* 8.4*  PHOS 4.3  --   --   --   --  6.1*   < > = values in this interval not displayed.   Liver Function Tests: Recent Labs  Lab 07/26/18 1458 07/28/18 2213 07/30/18 1020  AST 12*  --   --   ALT 6  --   --   ALKPHOS 69  --   --   BILITOT 1.0  --   --   PROT 6.3*  --   --   ALBUMIN 3.0* 2.9* 2.7*   No results for input(s): LIPASE, AMYLASE in the last 168 hours. No results for input(s): AMMONIA in the last 168 hours. CBC: Recent Labs  Lab 07/28/18 1848  07/29/18 0415 07/29/18 1556 07/29/18 1609 07/30/18 0358 07/30/18 1021  WBC 8.3  --  10.7* 12.0*  --  11.6* 12.1*  HGB 9.4*   < > 9.0* 8.5* 9.2* 7.8* 8.0*  HCT 31.6*   < > 29.2* 27.6* 27.0* 25.6* 26.4*  MCV 96.0  --  96.4 95.5  --  95.9 96.7  PLT 101*  --  113* 106*  --  81* 89*   < > = values in this interval not displayed.   Cardiac Enzymes: No  results for input(s): CKTOTAL, CKMB, CKMBINDEX, TROPONINI in the last 168 hours. CBG: Recent Labs  Lab 07/29/18 2019 07/29/18 2333 07/30/18 0402 07/30/18 0756 07/30/18 1202  GLUCAP 100* 100* 104* 99 93   Iron Studies: No results for input(s): IRON, TIBC, TRANSFERRIN, FERRITIN in the last 72 hours. Studies/Results: Dg Chest Port 1 View  Result Date: 07/30/2018 CLINICAL DATA:  Post AVR. EXAM: PORTABLE CHEST 1 VIEW COMPARISON:  07/29/2018 FINDINGS: Left IJ sheath remains in place with tip near the junction of the internal jugular vein to brachiocephalic vein. Lungs are somewhat hypoinflated with persistent opacification over the left mid to lower lung likely moderate size effusion with associated basilar atelectasis. Mild prominence of the perihilar markings suggesting a degree of vascular congestion. Mild stable cardiomegaly. Remainder of the exam is unchanged. IMPRESSION: Stable opacification over the left mid to lower lung likely moderate effusion with associated basilar atelectasis. Cardiomegaly with a suggestion of a stable degree of vascular congestion. Left IJ venous sheath in place. Electronically Signed   By: Marin Olp M.D.   On: 07/30/2018 08:52   Dg Chest Port 1 View  Result Date: 07/29/2018 CLINICAL DATA:  Chest tube.  Post AVR. EXAM: PORTABLE CHEST 1 VIEW COMPARISON:  07/28/2018 FINDINGS: Previous median sternotomy and AVR. Two thoracic drains over the mid and left chest unchanged. Left IJ Swan-Ganz catheter has tip over the proximally and right pulmonary artery. Lungs are adequately inflated demonstrate persistent opacification over the left mid to lower lung likely effusion with associated atelectasis. Mild hazy opacification of the perihilar markings unchanged likely mild vascular congestion. Mild stable cardiomegaly as remainder of the exam is unchanged. IMPRESSION: Stable left base opacification likely effusion with  atelectasis. Mild cardiomegaly with mild vascular congestion.  Tubes and lines as described. Electronically Signed   By: Marin Olp M.D.   On: 07/29/2018 08:33    ROS: As per HPI otherwise negative.   Physical Exam: Vitals:   07/30/18 0900 07/30/18 1000 07/30/18 1030 07/30/18 1200  BP:   (!) 140/121 (!) 136/118  Pulse:      Resp: (!) 23 18 (!) 22 (!) 25  Temp:      TempSrc:      SpO2: 100% 98% 100% 100%  Weight:      Height:         General: Chronically ill appearing male in no acute distress. Head: Normocephalic, atraumatic, sclera non-icteric, mucus membranes are moist Neck: Supple. JVD not elevated. Sheath LIJ with double lumen CVL, drsg CDI.  Lungs: Bilateral breath sounds decreased in bases bilaterally otherwise CTAB. No WOB. Marland Kitchen  Heart: RRR with S1 S2. Previously A-paced-now SB in 49s.  Abdomen: Soft, non-tender, non-distended with normoactive bowel sounds. No rebound/guarding. No obvious abdominal masses. M-S:  Strength and tone appear normal for age. Lower extremities:without edema or ischemic changes, no open wounds  Neuro: Alert and oriented X 3. Moves all extremities spontaneously. Psych:  Responds to questions appropriately with a normal affect. Dialysis Access: L AVF + bruit.   Dialysis Orders: Home Dialysis patient 4 days weekly [facility]  Assessment/Plan: 1.  Severe AI S/P AVR 07/28/18-per CT surgery 2.  Seizure disorder-Vimpat resumed 3.  HIV-continue meds per primary 4.  ESRD -  Home HD 4 days a week. K+ 5.5. Use 2.0 K bath, no heparin.  5.  Hypertension/volume  -Does not appear volume overloaded by exam but CXR suggests vascular congestion. Very hypertensive at present. NTG gtt @ 100 mcg/min, home meds resumed but held prior to HD. Wt 78.9 kg. Attempt 3-3.5 liters with HD.   6.  Anemia  - HGB 7.4 on adm. 8.0 today. No transfusion post op. Give Aranesp 200 mcg IV today. Follow HGB and transfuse if HGB 7.0 or less.  7.  Metabolic bone disease -  Ca 8.4 C Ca 9.4 Phos 6.1. Resume binders.  Getting orders from HD clinic for VDRA.  8.  Nutrition - Albumin 2.7 Change diet to renal/carb mod diet with prostat, renal vits.  9.  DM-Per primary.   Ahja Martello H. Owens Shark, NP-C 07/30/2018, 1:15 PM  [place]

## 2018-07-30 NOTE — Progress Notes (Signed)
2 Days Post-Op Procedure(s) (LRB): AORTIC VALVE REPLACEMENT (AVR) using Inspiris Resilia  size 23 Aortic Valve. (N/A) TRANSESOPHAGEAL ECHOCARDIOGRAM (TEE) (N/A) Subjective: More cooperative today Denies pain at present  Objective: Vital signs in last 24 hours: Temp:  [98.3 F (36.8 C)-98.7 F (37.1 C)] 98.5 F (36.9 C) (11/09 0800) Cardiac Rhythm: Atrial paced (11/09 0800) Resp:  [14-28] 18 (11/09 1000) BP: (119-160)/(69-139) 152/130 (11/09 0700) SpO2:  [91 %-100 %] 98 % (11/09 1000) Arterial Line BP: (152-229)/(57-110) 196/68 (11/09 1000) Weight:  [78.9 kg] 78.9 kg (11/09 0500)  Hemodynamic parameters for last 24 hours:    Intake/Output from previous day: 11/08 0701 - 11/09 0700 In: 975.9 [P.O.:120; I.V.:855.9] Out: 0  Intake/Output this shift: Total I/O In: 131.3 [I.V.:131.3] Out: -   General appearance: alert, cooperative and no distress Neurologic: nonfocal Heart: brady, regular Lungs: diminished breath sounds bibasilar and L>R Abdomen: normal findings: soft, non-tender  Lab Results: Recent Labs    07/29/18 1556 07/29/18 1609 07/30/18 0358  WBC 12.0*  --  11.6*  HGB 8.5* 9.2* 7.8*  HCT 27.6* 27.0* 25.6*  PLT 106*  --  81*   BMET:  Recent Labs    07/29/18 0415  07/29/18 1609 07/30/18 0358  NA 137  --  135 135  K 5.5*  --  5.4* 5.2*  CL 106  --  103 101  CO2 20*  --   --  20*  GLUCOSE 124*  --  105* 105*  BUN 54*  --  64* 67*  CREATININE 9.89*   < > 10.60* 10.62*  CALCIUM 8.6*  --   --  8.4*   < > = values in this interval not displayed.    PT/INR:  Recent Labs    07/28/18 1238  LABPROT 17.8*  INR 1.48   ABG    Component Value Date/Time   PHART 7.333 (L) 07/29/2018 0439   HCO3 20.5 07/29/2018 0439   TCO2 21 (L) 07/29/2018 1609   ACIDBASEDEF 5.0 (H) 07/29/2018 0439   O2SAT 92.0 07/29/2018 0439   CBG (last 3)  Recent Labs    07/29/18 2333 07/30/18 0402 07/30/18 0756  GLUCAP 100* 104* 99    Assessment/Plan: S/P Procedure(s)  (LRB): AORTIC VALVE REPLACEMENT (AVR) using Inspiris Resilia  size 23 Aortic Valve. (N/A) TRANSESOPHAGEAL ECHOCARDIOGRAM (TEE) (N/A) POD # 2 AVR  CV- in sinus brady- will decrease coreg  A line has significant "whip"- dc, follow cuff BP RESP- left lower lobe atelectasis on CXR- IS RENAL- has been seen by Nephrology- for HD today ENDO- CBG well controlled- change to AC/HS Anemia chronic secondary to RF- follow Cardiac rehab   LOS: 2 days    [name] 07/30/2018

## 2018-07-30 NOTE — Plan of Care (Signed)

## 2018-07-31 ENCOUNTER — Inpatient Hospital Stay (HOSPITAL_COMMUNITY): Payer: Medicare Other

## 2018-07-31 LAB — RENAL FUNCTION PANEL
Albumin: 2.5 g/dL — ABNORMAL LOW (ref 3.5–5.0)
Anion gap: 11 (ref 5–15)
BUN: 33 mg/dL — ABNORMAL HIGH (ref 8–23)
CHLORIDE: 100 mmol/L (ref 98–111)
CO2: 26 mmol/L (ref 22–32)
Calcium: 8.1 mg/dL — ABNORMAL LOW (ref 8.9–10.3)
Creatinine, Ser: 6.92 mg/dL — ABNORMAL HIGH (ref 0.61–1.24)
GFR, EST AFRICAN AMERICAN: 9 mL/min — AB (ref >60)
GFR, EST NON AFRICAN AMERICAN: 7 mL/min — AB (ref >60)
Glucose, Bld: 85 mg/dL (ref 70–99)
Phosphorus: 5 mg/dL — ABNORMAL HIGH (ref 2.5–4.6)
Potassium: 3.9 mmol/L (ref 3.5–5.1)
Sodium: 137 mmol/L (ref 135–145)

## 2018-07-31 LAB — CBC
HEMATOCRIT: 26 % — AB (ref 39.0–52.0)
Hemoglobin: 7.9 g/dL — ABNORMAL LOW (ref 13.0–17.0)
MCH: 29.3 pg (ref 26.0–34.0)
MCHC: 30.4 g/dL (ref 30.0–36.0)
MCV: 96.3 fL (ref 80.0–100.0)
NRBC: 0 % (ref 0.0–0.2)
Platelets: 92 10*3/uL — ABNORMAL LOW (ref 150–400)
RBC: 2.7 MIL/uL — AB (ref 4.22–5.81)
RDW: 17.1 % — AB (ref 11.5–15.5)
WBC: 7.8 10*3/uL (ref 4.0–10.5)

## 2018-07-31 LAB — FERRITIN: FERRITIN: 838 ng/mL — AB (ref 24–336)

## 2018-07-31 LAB — IRON AND TIBC: IRON: 16 ug/dL — AB (ref 45–182)

## 2018-07-31 MED ORDER — DARBEPOETIN ALFA 200 MCG/0.4ML IJ SOSY
200.0000 ug | PREFILLED_SYRINGE | INTRAMUSCULAR | Status: DC
  Administered 2018-08-08: 200 ug via INTRAVENOUS
  Filled 2018-07-31: qty 0.4

## 2018-07-31 NOTE — Progress Notes (Signed)
      [address]       [place] [address]             [phone]      No complaints this AM Says he walked today but RN corrected him BP 127/70   Pulse 76   Temp 98.3 F (36.8 C) (Oral)   Resp 20   Ht 6' (1.829 m)   Wt 76 kg   SpO2 96%   BMI 22.72 kg/m   Intake/Output Summary (Last 24 hours) at 07/31/2018 1828 Last data filed at 07/31/2018 1800 Gross per 24 hour  Intake 751.62 ml  Output -  Net 751.62 ml   Encouraged him to take a more active role in his recovery  Remo Lipps C. Roxan Hockey, MD [facility] [phone]

## 2018-07-31 NOTE — Plan of Care (Signed)

## 2018-07-31 NOTE — Progress Notes (Signed)
 [facility] Progress Note   Subjective: More alert today, oriented X 2. Tolerated HD well yesterday, off NTG gtt, BP well controlled. Unfortunately now in AFIB rate controlled. No C/Os. Still hiccoughing.   Objective Vitals:   07/31/18 0805 07/31/18 0900 07/31/18 1000 07/31/18 1100  BP:  122/73 118/66 98/61  Pulse:      Resp:  '18 14 15  ' Temp: 98.6 F (37 C)     TempSrc: Oral     SpO2:  94% 93% 95%  Weight:      Height:       Physical Exam General: Chronically ill appearing male in NAD Heart: regularly irreg. AFIB on monitor Lungs: decreased in bases bilaterally otherwise CTAB. No WOB.  Abdomen: Active BS Extremities: No LE edema Dialysis Access: L AVF + bruit/thrill.    Additional Objective Labs: Basic Metabolic Panel: Recent Labs  Lab 07/30/18 1020 07/30/18 1941 07/31/18 0359  NA 133* 136 137  K 5.4* 3.5 3.9  CL 100 98 100  CO2 19* 27 26  GLUCOSE 104* 87 85  BUN 69* 28* 33*  CREATININE 10.92* 5.76* 6.92*  CALCIUM 8.4* 7.8* 8.1*  PHOS 6.1* 3.9 5.0*   Liver Function Tests: Recent Labs  Lab 07/26/18 1458  07/30/18 1020 07/30/18 1941 07/31/18 0359  AST 12*  --   --   --   --   ALT 6  --   --   --   --   ALKPHOS 69  --   --   --   --   BILITOT 1.0  --   --   --   --   PROT 6.3*  --   --   --   --   ALBUMIN 3.0*   < > 2.7* 2.6* 2.5*   < > = values in this interval not displayed.   No results for input(s): LIPASE, AMYLASE in the last 168 hours. CBC: Recent Labs  Lab 07/29/18 1556  07/30/18 0358 07/30/18 1021 07/30/18 1941 07/31/18 0359  WBC 12.0*  --  11.6* 12.1* 9.8 7.8  HGB 8.5*   < > 7.8* 8.0* 8.2* 7.9*  HCT 27.6*   < > 25.6* 26.4* 26.1* 26.0*  MCV 95.5  --  95.9 96.7 94.2 96.3  PLT 106*  --  81* 89* 93* 92*   < > = values in this interval not displayed.   Blood Culture    Component Value Date/Time   SDES TRACHEAL ASPIRATE 07/11/2018 0020   SPECREQUEST NONE 07/11/2018 0020   CULT  07/11/2018 0020    Consistent with normal  respiratory flora. Performed at [facility], [place] [address], [place], [place] [address]    REPTSTATUS 07/13/2018 FINAL 07/11/2018 0020    Cardiac Enzymes: No results for input(s): CKTOTAL, CKMB, CKMBINDEX, TROPONINI in the last 168 hours. CBG: Recent Labs  Lab 07/29/18 2333 07/30/18 0402 07/30/18 0756 07/30/18 1202 07/30/18 1845  GLUCAP 100* 104* 99 93 80   Iron Studies:  Recent Labs    07/31/18 0359  IRON 16*  TIBC NOT CALCULATED  FERRITIN 838*   '@lablastinr3' @ Studies/Results: Dg Chest Port 1 View  Result Date: 07/31/2018 CLINICAL DATA:  Followup cardiac surgery and aortic valve replacement. EXAM: PORTABLE CHEST 1 VIEW COMPARISON:  07/30/2018 and older exams. FINDINGS: Confluent opacity at the left lung base, consistent with pleural fluid and atelectasis, is stable. Mild opacity at the right lung base is also stable consistent with a small effusion with mild atelectasis. There are  prominent bronchovascular markings without convincing pulmonary edema. No pneumothorax. No mediastinal widening. Left internal jugular introducer sheath is stable. IMPRESSION: 1. No significant change from the most recent prior exam allowing for differences in patient positioning and technique. No mediastinal widening, convincing pulmonary edema or pneumothorax. 2. Left greater than right lung base opacity consistent with a combination of pleural fluid with atelectasis. Electronically Signed   By: Lajean Manes M.D.   On: 07/31/2018 08:53   Dg Chest Port 1 View  Result Date: 07/30/2018 CLINICAL DATA:  Post AVR. EXAM: PORTABLE CHEST 1 VIEW COMPARISON:  07/29/2018 FINDINGS: Left IJ sheath remains in place with tip near the junction of the internal jugular vein to brachiocephalic vein. Lungs are somewhat hypoinflated with persistent opacification over the left mid to lower lung likely moderate size effusion with associated basilar atelectasis. Mild prominence of the perihilar markings suggesting a  degree of vascular congestion. Mild stable cardiomegaly. Remainder of the exam is unchanged. IMPRESSION: Stable opacification over the left mid to lower lung likely moderate effusion with associated basilar atelectasis. Cardiomegaly with a suggestion of a stable degree of vascular congestion. Left IJ venous sheath in place. Electronically Signed   By: Marin Olp M.D.   On: 07/30/2018 08:52   Medications: . sodium chloride    . sodium chloride Stopped (07/29/18 1036)  . sodium chloride    . sodium chloride    . magnesium sulfate    . nitroGLYCERIN Stopped (07/31/18 0755)  . phenylephrine (NEO-SYNEPHRINE) Adult infusion Stopped (07/28/18 1703)   . acetaminophen  1,000 mg Oral Q6H   Or  . acetaminophen (TYLENOL) oral liquid 160 mg/5 mL  1,000 mg Per Tube Q6H  . amLODipine  10 mg Oral QHS  . aspirin EC  325 mg Oral Daily   Or  . aspirin  324 mg Per Tube Daily  . bictegravir-emtricitabine-tenofovir AF  1 tablet Oral Daily  . bisacodyl  10 mg Oral Daily   Or  . bisacodyl  10 mg Rectal Daily  . calcium acetate  2,001 mg Oral TID WC  . carvedilol  12.5 mg Oral BID WC  . chlorhexidine gluconate (MEDLINE KIT)  15 mL Mouth Rinse BID  . Chlorhexidine Gluconate Cloth  6 each Topical Daily  . cloNIDine  0.3 mg Oral TID  . [START ON 08/06/2018] darbepoetin (ARANESP) injection - DIALYSIS  200 mcg Intravenous Q Sat-HD  . docusate sodium  200 mg Oral Daily  . feeding supplement (PRO-STAT SUGAR FREE 64)  30 mL Oral BID  . hydrALAZINE  100 mg Oral TID  . isosorbide mononitrate  20 mg Oral BID  . lacosamide  150 mg Oral BID  . multivitamin  1 tablet Oral Daily  . pantoprazole  40 mg Oral Daily  . sodium chloride flush  10-40 mL Intracatheter Q12H  . sodium chloride flush  3 mL Intravenous Q12H     Dialysis Orders: Home Dialysis patient 4 days weekly Getting records from [facility]  Assessment/Plan: 1.  Severe AI S/P AVR 07/28/18-per CT surgery 2.  AFib-post op AVR. Per primary.   3.  Seizure disorder-Vimpat resumed 4.  HIV-continue meds per primary 5.  ESRD -  Home HD 4 days a week. K+ 5.5. Use 2.0 K bath, no heparin. Use MWFS schedule. HD tomorrow.  6.  Hypertension/volume  -HD 07/31/18 Pre wt 81.8 kg Net UF 3.5 Post wt 78.3. Per brother, OP EDW 70.2 kg. Still above OP EDW. Needs standing wt. UFG 2.5-3 liters tomorrow.  Hoping loss of atrial kick with Afib will not impede volume removal with HD tomorrow.  7.  Anemia  - HGB 7.4 on adm. 7.9 today. No transfusion post op. Given Aranesp 200 mcg IV 07/30/18. Follow HGB and transfuse if HGB 7.0 or less.  8.  Metabolic bone disease -  Ca 8.4 C Ca 9.4 Phos 5.0. Resume binders. Getting orders from HD clinic for VDRA.  9.  Nutrition - Albumin 2.7 Change diet to renal/carb mod diet with prostat, renal vits.  10.  DM-Per primary.    H.  NP-C 07/31/2018, 11:40 AM  [employer] [phone]

## 2018-07-31 NOTE — Progress Notes (Signed)
3 Days Post-Op Procedure(s) (LRB): AORTIC VALVE REPLACEMENT (AVR) using Inspiris Resilia  size 23 Aortic Valve. (N/A) TRANSESOPHAGEAL ECHOCARDIOGRAM (TEE) (N/A) Subjective: Lying in bed being fed by brother Per RN- "dead weight" when they tried to get him up this AM  Objective: Vital signs in last 24 hours: Temp:  [97.8 F (36.6 C)-98.9 F (37.2 C)] 98.6 F (37 C) (11/10 0805) Pulse Rate:  [55-76] 76 (11/09 1735) Cardiac Rhythm: Normal sinus rhythm (11/10 0800) Resp:  [14-27] 14 (11/10 1000) BP: (88-157)/(57-137) 118/66 (11/10 1000) SpO2:  [91 %-100 %] 93 % (11/10 1000) Arterial Line BP: (201)/(72) 201/72 (11/09 1200) Weight:  [76 kg-81.8 kg] 76 kg (11/10 0530)  Hemodynamic parameters for last 24 hours:    Intake/Output from previous day: 11/09 0701 - 11/10 0700 In: 478.4 [I.V.:478.4] Out: 3500  Intake/Output this shift: Total I/O In: 134.4 [P.O.:120; I.V.:14.4] Out: -   General appearance: alert and no distress Neurologic: intact Heart: regular rate and rhythm Lungs: diminished breath sounds bibasilar Abdomen: normal findings: soft, non-tender  Lab Results: Recent Labs    07/30/18 1941 07/31/18 0359  WBC 9.8 7.8  HGB 8.2* 7.9*  HCT 26.1* 26.0*  PLT 93* 92*   BMET:  Recent Labs    07/30/18 1941 07/31/18 0359  NA 136 137  K 3.5 3.9  CL 98 100  CO2 27 26  GLUCOSE 87 85  BUN 28* 33*  CREATININE 5.76* 6.92*  CALCIUM 7.8* 8.1*    PT/INR:  Recent Labs    07/28/18 1238  LABPROT 17.8*  INR 1.48   ABG    Component Value Date/Time   PHART 7.333 (L) 07/29/2018 0439   HCO3 20.5 07/29/2018 0439   TCO2 21 (L) 07/29/2018 1609   ACIDBASEDEF 5.0 (H) 07/29/2018 0439   O2SAT 92.0 07/29/2018 0439   CBG (last 3)  Recent Labs    07/30/18 0756 07/30/18 1202 07/30/18 1845  GLUCAP 99 93 80    Assessment/Plan: S/P Procedure(s) (LRB): AORTIC VALVE REPLACEMENT (AVR) using Inspiris Resilia  size 23 Aortic Valve. (N/A) TRANSESOPHAGEAL ECHOCARDIOGRAM (TEE)  (N/A) -POD # 3 AVR CV- stable in SR, off nitroglycerin drip RESP_ needs to improve IS for atelectasis RENAL- ESRD- tolerated HD with 3.5 L off yesterday  For HD again tomorrow ENDO- CBG normal- SSI dc'ed Thrombocytopenia stable Deconditioning- PT consult   LOS: 3 days    [name] 07/31/2018

## 2018-08-01 LAB — RENAL FUNCTION PANEL
Albumin: 2.5 g/dL — ABNORMAL LOW (ref 3.5–5.0)
Anion gap: 12 (ref 5–15)
BUN: 49 mg/dL — ABNORMAL HIGH (ref 8–23)
CHLORIDE: 98 mmol/L (ref 98–111)
CO2: 26 mmol/L (ref 22–32)
CREATININE: 9.07 mg/dL — AB (ref 0.61–1.24)
Calcium: 8.1 mg/dL — ABNORMAL LOW (ref 8.9–10.3)
GFR calc non Af Amer: 5 mL/min — ABNORMAL LOW (ref >60)
GFR, EST AFRICAN AMERICAN: 6 mL/min — AB (ref >60)
GLUCOSE: 112 mg/dL — AB (ref 70–99)
Phosphorus: 5.8 mg/dL — ABNORMAL HIGH (ref 2.5–4.6)
Potassium: 3.7 mmol/L (ref 3.5–5.1)
Sodium: 136 mmol/L (ref 135–145)

## 2018-08-01 LAB — BPAM RBC
BLOOD PRODUCT EXPIRATION DATE: 201912032359
BLOOD PRODUCT EXPIRATION DATE: 201912032359
BLOOD PRODUCT EXPIRATION DATE: 201912042359
BLOOD PRODUCT EXPIRATION DATE: 201912042359
BLOOD PRODUCT EXPIRATION DATE: 201912042359
Blood Product Expiration Date: 201912052359
ISSUE DATE / TIME: 201911040944
ISSUE DATE / TIME: 201911070928
ISSUE DATE / TIME: 201911070928
ISSUE DATE / TIME: 201911071009
ISSUE DATE / TIME: 201911071009
UNIT TYPE AND RH: 5100
UNIT TYPE AND RH: 9500
Unit Type and Rh: 5100
Unit Type and Rh: 9500
Unit Type and Rh: 9500
Unit Type and Rh: 9500

## 2018-08-01 LAB — TYPE AND SCREEN
ABO/RH(D): O NEG
Antibody Screen: NEGATIVE
UNIT DIVISION: 0
Unit division: 0
Unit division: 0
Unit division: 0
Unit division: 0
Unit division: 0

## 2018-08-01 LAB — CBC
HCT: 25.7 % — ABNORMAL LOW (ref 39.0–52.0)
Hemoglobin: 7.9 g/dL — ABNORMAL LOW (ref 13.0–17.0)
MCH: 29.7 pg (ref 26.0–34.0)
MCHC: 30.7 g/dL (ref 30.0–36.0)
MCV: 96.6 fL (ref 80.0–100.0)
NRBC: 0 % (ref 0.0–0.2)
Platelets: 117 10*3/uL — ABNORMAL LOW (ref 150–400)
RBC: 2.66 MIL/uL — ABNORMAL LOW (ref 4.22–5.81)
RDW: 16.5 % — AB (ref 11.5–15.5)
WBC: 6.8 10*3/uL (ref 4.0–10.5)

## 2018-08-01 MED ORDER — AMIODARONE LOAD VIA INFUSION
150.0000 mg | Freq: Once | INTRAVENOUS | Status: AC
  Administered 2018-08-01: 150 mg via INTRAVENOUS
  Filled 2018-08-01: qty 83.34

## 2018-08-01 MED ORDER — AMIODARONE HCL IN DEXTROSE 360-4.14 MG/200ML-% IV SOLN
60.0000 mg/h | INTRAVENOUS | Status: AC
  Administered 2018-08-01: 60 mg/h via INTRAVENOUS
  Filled 2018-08-01: qty 200

## 2018-08-01 MED ORDER — AMIODARONE HCL IN DEXTROSE 360-4.14 MG/200ML-% IV SOLN
30.0000 mg/h | INTRAVENOUS | Status: AC
  Administered 2018-08-01: 30 mg/h via INTRAVENOUS
  Filled 2018-08-01 (×2): qty 200

## 2018-08-01 MED ORDER — ORAL CARE MOUTH RINSE
15.0000 mL | Freq: Two times a day (BID) | OROMUCOSAL | Status: DC
  Administered 2018-08-01: 15 mL via OROMUCOSAL

## 2018-08-01 MED ORDER — OXYCODONE HCL 5 MG PO TABS
5.0000 mg | ORAL_TABLET | Freq: Four times a day (QID) | ORAL | Status: DC | PRN
  Administered 2018-08-01 – 2018-08-04 (×6): 5 mg via ORAL
  Filled 2018-08-01 (×6): qty 1

## 2018-08-01 NOTE — Procedures (Signed)
I was present at this dialysis session. I have reviewed the session itself and made appropriate changes.   Vital signs in last 24 hours:  Temp:  [97.8 F (36.6 C)-98.6 F (37 C)] 98 F (36.7 C) (11/11 0849) Pulse Rate:  [68-70] 68 (11/11 0905) Resp:  [8-29] 14 (11/11 0905) BP: (95-195)/(58-123) 122/74 (11/11 0905) SpO2:  [92 %-98 %] 93 % (11/11 0730) Weight:  [77.2 kg-78.6 kg] 77.2 kg (11/11 0849) Weight change: -3.2 kg Filed Weights   07/31/18 0530 08/01/18 0500 08/01/18 0849  Weight: 76 kg 78.6 kg 77.2 kg    Recent Labs  Lab 08/01/18 0605  NA 136  K 3.7  CL 98  CO2 26  GLUCOSE 112*  BUN 49*  CREATININE 9.07*  CALCIUM 8.1*  PHOS 5.8*    Recent Labs  Lab 07/30/18 1941 07/31/18 0359 08/01/18 0605  WBC 9.8 7.8 6.8  HGB 8.2* 7.9* 7.9*  HCT 26.1* 26.0* 25.7*  MCV 94.2 96.3 96.6  PLT 93* 92* 117*    Scheduled Meds: . acetaminophen  1,000 mg Oral Q6H   Or  . acetaminophen (TYLENOL) oral liquid 160 mg/5 mL  1,000 mg Per Tube Q6H  . amLODipine  10 mg Oral QHS  . aspirin EC  325 mg Oral Daily   Or  . aspirin  324 mg Per Tube Daily  . bictegravir-emtricitabine-tenofovir AF  1 tablet Oral Daily  . bisacodyl  10 mg Oral Daily   Or  . bisacodyl  10 mg Rectal Daily  . calcium acetate  2,001 mg Oral TID WC  . carvedilol  12.5 mg Oral BID WC  . chlorhexidine gluconate (MEDLINE KIT)  15 mL Mouth Rinse BID  . Chlorhexidine Gluconate Cloth  6 each Topical Daily  . cloNIDine  0.3 mg Oral TID  . [START ON 08/08/2018] darbepoetin (ARANESP) injection - DIALYSIS  200 mcg Intravenous Q Mon-HD  . docusate sodium  200 mg Oral Daily  . feeding supplement (PRO-STAT SUGAR FREE 64)  30 mL Oral BID  . hydrALAZINE  100 mg Oral TID  . isosorbide mononitrate  20 mg Oral BID  . lacosamide  150 mg Oral BID  . multivitamin  1 tablet Oral Daily  . pantoprazole  40 mg Oral Daily  . sodium chloride flush  10-40 mL Intracatheter Q12H  . sodium chloride flush  3 mL Intravenous Q12H    Continuous Infusions: . sodium chloride    . sodium chloride Stopped (07/29/18 1036)  . sodium chloride    . sodium chloride    . magnesium sulfate    . nitroGLYCERIN Stopped (07/31/18 0755)  . phenylephrine (NEO-SYNEPHRINE) Adult infusion Stopped (07/28/18 1703)   PRN Meds:.sodium chloride, sodium chloride, albuterol, ALPRAZolam, hydrALAZINE, lidocaine (PF), lidocaine-prilocaine, metoprolol tartrate, ondansetron (ZOFRAN) IV, oxyCODONE, pentafluoroprop-tetrafluoroeth, sodium chloride flush, sodium chloride flush, traMADol, traZODone    Dialysis Orders:Home Dialysis patient 4 days weekly [facility]  Assessment/Plan: 1. Severe AI S/P AVR 07/28/18-per CT surgery 2.  AFib-post op AVR. Per primary.  3. Seizure disorder-Vimpat resumed 4. HIV-continue meds per primary 5. ESRD - Home HD 4 days a week. K+ 5.5. Use 2.0 K bath, no heparin.Use MWFS schedule. HD tomorrow.  6. Hypertension/volume -HD 07/31/18 Pre wt 81.8 kg Net UF 3.5 Post wt 78.3. Per brother, OP EDW 70.2 kg. Still above OP EDW. Needs standing wt. UFG 2.5-3 liters tomorrow. Hoping loss of atrial kick with Afib will not impede volume removal with HD tomorrow.  7. Anemia -  HGB 7.4 on adm. 7.9 today. No transfusion post op. Given Aranesp 200 mcg IV 07/30/18. Follow HGB and transfuse if HGB 7.0 or less. 8. Metabolic bone disease - Ca 8.4 C Ca 9.4 Phos 5.0. Resume binders. Getting orders from HD clinic for VDRA. 9. Nutrition - Albumin 2.7 Change diet to renal/carb mod diet with prostat, renal vits.  10. DM-Per primary.   Donetta Potts,  MD 08/01/2018, 9:18 AM

## 2018-08-01 NOTE — Progress Notes (Signed)
Patient ID: Jeffrey [name], male   DOB: December 04, 1955, 62 y.o.   MRN: [id number] EVENING ROUNDS NOTE :     [address]       [place] [address]             [phone]                 4 Days Post-Op Procedure(s) (LRB): AORTIC VALVE REPLACEMENT (AVR) using Inspiris Resilia  size 23 Aortic Valve. (N/A) TRANSESOPHAGEAL ECHOCARDIOGRAM (TEE) (N/A)  Total Length of Stay:  LOS: 4 days  BP 109/62   Pulse 63   Temp 98.7 F (37.1 C) (Oral)   Resp (!) 25   Ht 6' (1.829 m)   Wt 74.8 kg   SpO2 96%   BMI 22.37 kg/m   .Intake/Output      11/10 0701 - 11/11 0700 11/11 0701 - 11/12 0700   P.O. 600    I.V. (mL/kg) 14.4 (0.2) 200 (2.7)   Total Intake(mL/kg) 614.4 (7.8) 200 (2.7)   Other  2500   Total Output  2500   Net +614.4 -2300          . sodium chloride    . sodium chloride Stopped (07/29/18 1036)  . sodium chloride    . sodium chloride    . amiodarone 60 mg/hr (08/01/18 1728)   Followed by  . amiodarone    . magnesium sulfate    . nitroGLYCERIN Stopped (07/31/18 0755)  . phenylephrine (NEO-SYNEPHRINE) Adult infusion Stopped (07/28/18 1703)     Lab Results  Component Value Date   WBC 6.8 08/01/2018   HGB 7.9 (L) 08/01/2018   HCT 25.7 (L) 08/01/2018   PLT 117 (L) 08/01/2018   GLUCOSE 112 (H) 08/01/2018   CHOL 98 07/14/2018   TRIG 77 07/16/2018   HDL 35 (L) 07/14/2018   LDLCALC 49 07/14/2018   ALT 6 07/26/2018   AST 12 (L) 07/26/2018   NA 136 08/01/2018   K 3.7 08/01/2018   CL 98 08/01/2018   CREATININE 9.07 (H) 08/01/2018   BUN 49 (H) 08/01/2018   CO2 26 08/01/2018   TSH 4.286 07/10/2018   INR 1.48 07/28/2018   HGBA1C 4.2 (L) 07/26/2018   afib today started on Zara Chess MD  Beeper [phone] Office [phone] 08/01/2018 6:44 PM

## 2018-08-01 NOTE — Progress Notes (Signed)
PT Cancellation Note  Patient Details Name: RUSTON FEDORA MRN: [id number] DOB: March 19, 1956   Cancelled Treatment:    Reason Eval/Treat Not Completed: Patient at procedure or test/unavailable. Pt being set up to begin HD session in room. PT to re-attempt eval as time allows.   Lorriane Shire 08/01/2018, 8:53 AM   Lorrin Goodell, PT  Office # [phone] Pager [phone]

## 2018-08-01 NOTE — Progress Notes (Signed)
Attempted to get patient out of bed. Offered choice of [employer] or [employer] lifts or to just stand and pivot to get to chair. Patient continued to state that he "cannot stand" and "is not getting out of the bed". Educated patient about importance of mobility after surgery. Patient began to get verbally abusive and so decided to pursue it later. Will try again when patient's brother is in room.

## 2018-08-01 NOTE — Progress Notes (Signed)
4 Days Post-Op Procedure(s) (LRB): AORTIC VALVE REPLACEMENT (AVR) using Inspiris Resilia  size 23 Aortic Valve. (N/A) TRANSESOPHAGEAL ECHOCARDIOGRAM (TEE) (N/A) Subjective: Still c/o being weak  Objective: Vital signs in last 24 hours: Temp:  [97.8 F (36.6 C)-98.6 F (37 C)] 98.6 F (37 C) (11/11 0400) Cardiac Rhythm: Normal sinus rhythm;Sinus bradycardia (11/11 0400) Resp:  [8-29] 18 (11/11 0730) BP: (95-195)/(58-123) 144/74 (11/11 0730) SpO2:  [92 %-98 %] 93 % (11/11 0730) Weight:  [78.6 kg] 78.6 kg (11/11 0500)  Hemodynamic parameters for last 24 hours:    Intake/Output from previous day: 11/10 0701 - 11/11 0700 In: 614.4 [P.O.:600; I.V.:14.4] Out: -  Intake/Output this shift: No intake/output data recorded.  General appearance: cooperative and no distress Neurologic: generally weak, no focal deficit Heart: regular rate and rhythm Lungs: clear to auscultation bilaterally  Lab Results: Recent Labs    07/31/18 0359 08/01/18 0605  WBC 7.8 6.8  HGB 7.9* 7.9*  HCT 26.0* 25.7*  PLT 92* 117*   BMET:  Recent Labs    07/31/18 0359 08/01/18 0605  NA 137 136  K 3.9 3.7  CL 100 98  CO2 26 26  GLUCOSE 85 112*  BUN 33* 49*  CREATININE 6.92* 9.07*  CALCIUM 8.1* 8.1*    PT/INR: No results for input(s): LABPROT, INR in the last 72 hours. ABG    Component Value Date/Time   PHART 7.333 (L) 07/29/2018 0439   HCO3 20.5 07/29/2018 0439   TCO2 21 (L) 07/29/2018 1609   ACIDBASEDEF 5.0 (H) 07/29/2018 0439   O2SAT 92.0 07/29/2018 0439   CBG (last 3)  Recent Labs    07/30/18 0756 07/30/18 1202 07/30/18 1845  GLUCAP 99 93 80    Assessment/Plan: S/P Procedure(s) (LRB): AORTIC VALVE REPLACEMENT (AVR) using Inspiris Resilia  size 23 Aortic Valve. (N/A) TRANSESOPHAGEAL ECHOCARDIOGRAM (TEE) (N/A) -NEURO- no focal deficit but remains generally weak  PT consult CV- in SR, brief spell of atrial fib this AM- resolved with lopressor RESP_ IS RENAL- ESRD- for HD  today ENDO- no issues SCD + enoxaparin for DVT prophylaxis Thrombocytopenia improved   LOS: 4 days    [name] 08/01/2018

## 2018-08-02 ENCOUNTER — Inpatient Hospital Stay (HOSPITAL_COMMUNITY): Payer: Medicare Other

## 2018-08-02 LAB — CBC
HEMATOCRIT: 25.5 % — AB (ref 39.0–52.0)
HEMOGLOBIN: 7.7 g/dL — AB (ref 13.0–17.0)
MCH: 29.6 pg (ref 26.0–34.0)
MCHC: 30.2 g/dL (ref 30.0–36.0)
MCV: 98.1 fL (ref 80.0–100.0)
Platelets: 133 10*3/uL — ABNORMAL LOW (ref 150–400)
RBC: 2.6 MIL/uL — ABNORMAL LOW (ref 4.22–5.81)
RDW: 16.2 % — ABNORMAL HIGH (ref 11.5–15.5)
WBC: 5.8 10*3/uL (ref 4.0–10.5)
nRBC: 0 % (ref 0.0–0.2)

## 2018-08-02 LAB — BASIC METABOLIC PANEL
Anion gap: 10 (ref 5–15)
BUN: 28 mg/dL — AB (ref 8–23)
CHLORIDE: 99 mmol/L (ref 98–111)
CO2: 26 mmol/L (ref 22–32)
Calcium: 7.8 mg/dL — ABNORMAL LOW (ref 8.9–10.3)
Creatinine, Ser: 5.99 mg/dL — ABNORMAL HIGH (ref 0.61–1.24)
GFR calc Af Amer: 10 mL/min — ABNORMAL LOW (ref >60)
GFR calc non Af Amer: 9 mL/min — ABNORMAL LOW (ref >60)
Glucose, Bld: 125 mg/dL — ABNORMAL HIGH (ref 70–99)
Potassium: 3.5 mmol/L (ref 3.5–5.1)
SODIUM: 135 mmol/L (ref 135–145)

## 2018-08-02 MED ORDER — AMIODARONE HCL 200 MG PO TABS
400.0000 mg | ORAL_TABLET | Freq: Two times a day (BID) | ORAL | Status: DC
  Administered 2018-08-02 (×2): 400 mg via ORAL
  Filled 2018-08-02 (×2): qty 2

## 2018-08-02 MED ORDER — CARVEDILOL 6.25 MG PO TABS
6.2500 mg | ORAL_TABLET | Freq: Two times a day (BID) | ORAL | Status: DC
  Administered 2018-08-02: 6.25 mg via ORAL
  Filled 2018-08-02: qty 1

## 2018-08-02 MED ORDER — ENOXAPARIN SODIUM 30 MG/0.3ML ~~LOC~~ SOLN
30.0000 mg | Freq: Every day | SUBCUTANEOUS | Status: DC
  Administered 2018-08-02 – 2018-08-20 (×19): 30 mg via SUBCUTANEOUS
  Filled 2018-08-02 (×19): qty 0.3

## 2018-08-02 NOTE — Progress Notes (Signed)
[facility] Progress Note    Subjective:   Feels "good"   Objective:   BP 124/70   Pulse 60   Temp (!) 97.4 F (36.3 C) (Oral)   Resp (!) 4   Ht 6' (1.829 m)   Wt 72.8 kg   SpO2 100%   BMI 21.77 kg/m   Intake/Output: I/O last 3 completed shifts: In: 1140.9 [P.O.:480; I.V.:660.9] Out: 2500 [Other:2500]   Intake/Output this shift:  Total I/O In: 72 [I.V.:72] Out: -  Weight change: -1.4 kg  Physical Exam: Gen: NAD CVS: no rub Resp: cta Abd: benign Ext: no edema, LAVF +T/B  Labs: BMET Recent Labs  Lab 07/26/18 1458  07/28/18 2213 07/29/18 0415  07/29/18 1609 07/30/18 0358 07/30/18 1020 07/30/18 1941 07/31/18 0359 08/01/18 0605 08/02/18 0509  NA 137   < > 132* 137  --  135 135 133* 136 137 136 135  K 4.2   < > 5.5* 5.5*  --  5.4* 5.2* 5.4* 3.5 3.9 3.7 3.5  CL 102   < > 101 106  --  103 101 100 98 100 98 99  CO2 22  --  19* 20*  --   --  20* 19* 27 26 26 26   GLUCOSE 105*   < > 123* 124*  --  105* 105* 104* 87 85 112* 125*  BUN 58*   < > 52* 54*  --  64* 67* 69* 28* 33* 49* 28*  CREATININE 11.44*   < > 9.37* 9.89*   < > 10.60* 10.62* 10.92* 5.76* 6.92* 9.07* 5.99*  ALBUMIN 3.0*  --  2.9*  --   --   --   --  2.7* 2.6* 2.5* 2.5*  --   CALCIUM 8.8*  --  8.2* 8.6*  --   --  8.4* 8.4* 7.8* 8.1* 8.1* 7.8*  PHOS  --   --  4.3  --   --   --   --  6.1* 3.9 5.0* 5.8*  --    < > = values in this interval not displayed.   CBC Recent Labs  Lab 07/30/18 1941 07/31/18 0359 08/01/18 0605 08/02/18 0509  WBC 9.8 7.8 6.8 5.8  HGB 8.2* 7.9* 7.9* 7.7*  HCT 26.1* 26.0* 25.7* 25.5*  MCV 94.2 96.3 96.6 98.1  PLT 93* 92* 117* 133*    @IMGRELPRIORS @ Medications:    . acetaminophen  1,000 mg Oral Q6H   Or  . acetaminophen (TYLENOL) oral liquid 160 mg/5 mL  1,000 mg Per Tube Q6H  . amiodarone  400 mg Oral BID  . amLODipine  10 mg Oral QHS  . aspirin EC  325 mg Oral Daily   Or  . aspirin  324 mg Per Tube Daily  . bictegravir-emtricitabine-tenofovir  AF  1 tablet Oral Daily  . bisacodyl  10 mg Oral Daily   Or  . bisacodyl  10 mg Rectal Daily  . calcium acetate  2,001 mg Oral TID WC  . carvedilol  6.25 mg Oral BID WC  . Chlorhexidine Gluconate Cloth  6 each Topical Daily  . cloNIDine  0.3 mg Oral TID  . [START ON 08/08/2018] darbepoetin (ARANESP) injection - DIALYSIS  200 mcg Intravenous Q Mon-HD  . docusate sodium  200 mg Oral Daily  . enoxaparin (LOVENOX) injection  30 mg Subcutaneous Daily  . feeding supplement (PRO-STAT SUGAR FREE 64)  30 mL Oral BID  . hydrALAZINE  100 mg Oral TID  . isosorbide mononitrate  20  mg Oral BID  . lacosamide  150 mg Oral BID  . multivitamin  1 tablet Oral Daily  . pantoprazole  40 mg Oral Daily  . sodium chloride flush  10-40 mL Intracatheter Q12H  . sodium chloride flush  3 mL Intravenous Q12H   Dialysis Orders:Home Dialysis patient 4 days weekly Getting records from [facility] 70.2 kg   Assessment/ Plan:   1. Severe AI S/P AVR 07/28/18-per CT surgery 2. AFib-post op AVR.  Now on amiodarone per Dr. Servando Snare. 3. Seizure disorder-Vimpat resumed 4. HIV-continue meds per primary 5. ESRD - Home HD 4 days a week. K+ 5.5. Use 2.0 K bath, no heparin.Use MWFS schedule. HD tomorrow. 6. Hypertension/volume -post weights are improving but do not make sense as post weight was 74.8 yesterday and today he is 72.8.  Will cont to UF as BP tolerates to get to his edw of 70.2 and have him stand for weights (he can go up for HD tomorrow). Hoping loss of atrial kick with Afib will not impede volume removal with HD tomorrow. 7. Anemia - HGB 7.4 on adm.7.7today. No transfusion post op. GivenAranesp 200 mcg IV 07/30/18. Follow HGB and transfuse if HGB 7.0 or less. 8. Metabolic bone disease - Ca 8.4 C Ca 9.4 Phos5.0. Resume binders. Getting orders from HD clinic for VDRA. 9. Nutrition - Albumin 2.7 Change diet to renal/carb mod diet with prostat, renal vits.  10. DM-Per  primary.  Donetta Potts, MD [place] Pager [phone] 08/02/2018, 11:42 AM

## 2018-08-02 NOTE — Progress Notes (Signed)
5 Days Post-Op Procedure(s) (LRB): AORTIC VALVE REPLACEMENT (AVR) using Inspiris Resilia  size 23 Aortic Valve. (N/A) TRANSESOPHAGEAL ECHOCARDIOGRAM (TEE) (N/A) Subjective: C/o incisional pain Ambulated to door  Objective: Vital signs in last 24 hours: Temp:  [97.6 F (36.4 C)-98.7 F (37.1 C)] 97.6 F (36.4 C) (11/12 0400) Pulse Rate:  [59-70] 63 (11/11 1229) Cardiac Rhythm: Atrial paced (11/12 0300) Resp:  [10-26] 13 (11/12 0600) BP: (90-147)/(50-93) 99/67 (11/12 0600) SpO2:  [94 %-100 %] 97 % (11/12 0600) Weight:  [72.8 kg-77.2 kg] 72.8 kg (11/12 0500)  Hemodynamic parameters for last 24 hours:    Intake/Output from previous day: 11/11 0701 - 11/12 0700 In: 1072.1 [P.O.:480; I.V.:592.1] Out: 2500  Intake/Output this shift: No intake/output data recorded.  General appearance: alert, cooperative and no distress Neurologic: intact Heart: regular rate and rhythm and brady under pacer Lungs: diminished breath sounds left base Wound: clean and dry  Lab Results: Recent Labs    08/01/18 0605 08/02/18 0509  WBC 6.8 5.8  HGB 7.9* 7.7*  HCT 25.7* 25.5*  PLT 117* 133*   BMET:  Recent Labs    08/01/18 0605 08/02/18 0509  NA 136 135  K 3.7 3.5  CL 98 99  CO2 26 26  GLUCOSE 112* 125*  BUN 49* 28*  CREATININE 9.07* 5.99*  CALCIUM 8.1* 7.8*    PT/INR: No results for input(s): LABPROT, INR in the last 72 hours. ABG    Component Value Date/Time   PHART 7.333 (L) 07/29/2018 0439   HCO3 20.5 07/29/2018 0439   TCO2 21 (L) 07/29/2018 1609   ACIDBASEDEF 5.0 (H) 07/29/2018 0439   O2SAT 92.0 07/29/2018 0439   CBG (last 3)  Recent Labs    07/30/18 1202 07/30/18 1845  GLUCAP 93 80    Assessment/Plan: S/P Procedure(s) (LRB): AORTIC VALVE REPLACEMENT (AVR) using Inspiris Resilia  size 23 Aortic Valve. (N/A) TRANSESOPHAGEAL ECHOCARDIOGRAM (TEE) (N/A) -CV- sinus brady- will decrease coreg, change amiodarone to PO  BP controlled RESP- worsening atelectasis on  left- may have some pleural effusion as well  HD yesterday. IS, cough, deep breathe RENAL- ESRD- plan per Nephrology ENDO- No issues Deconditioning- continue to mobilize   LOS: 5 days    [name] 08/02/2018

## 2018-08-02 NOTE — Progress Notes (Signed)
[facility] BRIEF SICU PROGRESS NOTE  5 Days Post-Op  S/P Procedure(s) (LRB): AORTIC VALVE REPLACEMENT (AVR) using Inspiris Resilia  size 23 Aortic Valve. (N/A) TRANSESOPHAGEAL ECHOCARDIOGRAM (TEE) (N/A)   Stable day AAI paced w/ stable BP Breathing comfortably on room iar  Plan: Continue current plan  Rexene Alberts, MD 08/02/2018 6:20 PM

## 2018-08-02 NOTE — Evaluation (Signed)
Physical Therapy Evaluation Patient Details Name: Jeffrey [name] MRN: [id number] DOB: [birth year] Today's Date: 08/02/2018   History of Present Illness  62 y.o. male admitted on 07/09/18 for seizure with AMS/increased somnolence and missed HD, CXR showed plural effusion, and CT was negative.  Pt also with anemia/thrombocytopenia, hypertensive urgency.  Pt with seizure during HD on 07/10/18 and code blue called due to loss of pulse.  Pt was intubated and transferred to ICU (intubated 10/20-10/24/19).  Pt with other significant PMH of HIV, Hep B, ESRD on HD (at home), aortic valve stenosis, I and D of L knee, multiple AVF placements/revisions.  Clinical Impression  Orders received for PT evaluation. Patient demonstrates deficits in functional mobility as indicated below. Will benefit from continued skilled PT to address deficits and maximize function. Will see as indicated and progress as tolerated.  At this time, patient reluctant to participate and very limited physically with overall functional status. Anticipate patient will need post acute rehabilitation and un likely that he will tolerate intensive setting. Recommending SNF at this time.    Follow Up Recommendations SNF;Supervision for mobility/OOB    Equipment Recommendations  None recommended by PT    Recommendations for Other Services       Precautions / Restrictions Precautions Precautions: Fall Precaution Comments: mildly unsteady on his feet. Restrictions Weight Bearing Restrictions: Yes(sternal precautions)      Mobility  Bed Mobility Overal bed mobility: Needs Assistance Bed Mobility: Sit to Supine       Sit to supine: Mod assist   General bed mobility comments: Moderate assist to elevate LEs back to bed and reposition  Transfers Overall transfer level: Needs assistance Equipment used: Rolling walker (2 wheeled);None Transfers: Sit to/from Stand Sit to Stand: Mod assist;+2 physical assistance         General  transfer comment: Increased physical assist to elevate to standing, Max cues for steranl compliance  Ambulation/Gait Ambulation/Gait assistance: Min assist Gait Distance (Feet): 12 Feet Assistive device: Rolling walker (2 wheeled) Gait Pattern/deviations: Step-through pattern;Staggering left Gait velocity: decreased Gait velocity interpretation: <1.31 ft/sec, indicative of household ambulator General Gait Details: Heavy flexed posture and limited activity tolerance  Stairs            Wheelchair Mobility    Modified Rankin (Stroke Patients Only)       Balance Overall balance assessment: Needs assistance Sitting-balance support: Feet supported;No upper extremity supported Sitting balance-Leahy Scale: Good     Standing balance support: Bilateral upper extremity supported Standing balance-Leahy Scale: Poor                               Pertinent Vitals/Pain      Home Living Family/patient expects to be discharged to:: Private residence Living Arrangements: Other relatives Available Help at Discharge: Family;Available PRN/intermittently Type of Home: House(townhome) Home Access: Stairs to enter Entrance Stairs-Rails: Right Entrance Stairs-Number of Steps: 3 Home Layout: Two level;Able to live on main level with bedroom/bathroom Home Equipment: Gilford Rile - 2 wheels;Walker - 4 wheels;Cane - quad Additional Comments: Home Hemodialisis/ has a poodle male dog that he loves to care for at home    Prior Function Level of Independence: Independent with assistive device(s)         Comments: pt report he uses cane/RW depending on how he feels, his brother is with him when he goes out of the house for appointments and his brother drives.  Brother works night shift and helps  him PRN during the day.  reports he does teh cooking and walks the dog most of the time     Hand Dominance   Dominant Hand: Right    Extremity/Trunk Assessment   Upper Extremity  Assessment Upper Extremity Assessment: Generalized weakness    Lower Extremity Assessment Lower Extremity Assessment: Generalized weakness    Cervical / Trunk Assessment Cervical / Trunk Assessment: Normal  Communication   Communication: No difficulties;Other (comment)(does not appear to have any teeth. )  Cognition Arousal/Alertness: Awake/alert Behavior During Therapy: WFL for tasks assessed/performed Overall Cognitive Status: Impaired/Different from baseline Area of Impairment: Memory;Problem solving                     Memory: Decreased short-term memory       Problem Solving: Slow processing        General Comments      Exercises     Assessment/Plan    PT Assessment Patient needs continued PT services  PT Problem List Decreased strength;Decreased activity tolerance;Decreased balance;Decreased mobility;Decreased knowledge of use of DME       PT Treatment Interventions DME instruction;Gait training;Stair training;Functional mobility training;Therapeutic activities;Therapeutic exercise;Balance training;Patient/family education    PT Goals (Current goals can be found in the Care Plan section)  Acute Rehab PT Goals Patient Stated Goal: to go home today PT Goal Formulation: With patient Time For Goal Achievement: 08/01/18 Potential to Achieve Goals: Good    Frequency Min 3X/week   Barriers to discharge        Co-evaluation               AM-PAC PT "6 Clicks" Daily Activity  Outcome Measure Difficulty turning over in bed (including adjusting bedclothes, sheets and blankets)?: Unable Difficulty moving from lying on back to sitting on the side of the bed? : Unable Difficulty sitting down on and standing up from a chair with arms (e.g., wheelchair, bedside commode, etc,.)?: Unable Help needed moving to and from a bed to chair (including a wheelchair)?: A Lot Help needed walking in hospital room?: A Lot Help needed climbing 3-5 steps with a  railing? : A Lot 6 Click Score: 9    End of Session Equipment Utilized During Treatment: Gait belt Activity Tolerance: Patient tolerated treatment well Patient left: in bed;with call bell/phone within reach;with bed alarm set Nurse Communication: Mobility status PT Visit Diagnosis: Muscle weakness (generalized) (M62.81);Difficulty in walking, not elsewhere classified (R26.2)    Time: 6812-7517 PT Time Calculation (min) (ACUTE ONLY): 20 min   Charges:   PT Evaluation $PT Eval Moderate Complexity: 1 Mod          Alben Deeds, PT DPT  Board Certified Neurologic Specialist Acute Rehabilitation Services Pager [phone] Office [phone]   Duncan Dull 08/02/2018, 4:50 PM

## 2018-08-02 NOTE — Consult Note (Signed)
   [facility] CM Inpatient Consult   08/02/2018  Jeffrey [name] 02/11/1956 [id number]  Patient was assessed for extreme high risk score for readmissions and reviewed for [facility] Care Management outreaches.  Patient was previously declined [facility] Management services last week.  Following for progress and disposition.  Will follow up with inpatient care management staff as appropriate.  Chart reviewed for progress. Of note, [facility] Care Management services does not replace or interfere with any services that are arranged by inpatient case management or social work. For additional questions or referrals please contact:  Natividad Brood, RN BSN [facility] Liaison  [phone] business mobile phone Toll free office [phone]

## 2018-08-02 NOTE — Plan of Care (Signed)
  Problem: Education: Goal: Will demonstrate proper wound care and an understanding of methods to prevent future damage Outcome: Progressing Goal: Knowledge of disease or condition will improve Outcome: Progressing   Problem: Activity: Goal: Risk for activity intolerance will decrease Outcome: Progressing   Problem: Clinical Measurements: Goal: Postoperative complications will be avoided or minimized Outcome: Progressing   Problem: Respiratory: Goal: Respiratory status will improve Outcome: Progressing   Problem: Skin Integrity: Goal: Wound healing without signs and symptoms of infection Outcome: Progressing Goal: Risk for impaired skin integrity will decrease Outcome: Progressing

## 2018-08-03 ENCOUNTER — Inpatient Hospital Stay (HOSPITAL_COMMUNITY): Payer: Medicare Other

## 2018-08-03 LAB — CBC
HCT: 26.8 % — ABNORMAL LOW (ref 39.0–52.0)
HEMOGLOBIN: 8 g/dL — AB (ref 13.0–17.0)
MCH: 29.2 pg (ref 26.0–34.0)
MCHC: 29.9 g/dL — ABNORMAL LOW (ref 30.0–36.0)
MCV: 97.8 fL (ref 80.0–100.0)
Platelets: 172 10*3/uL (ref 150–400)
RBC: 2.74 MIL/uL — AB (ref 4.22–5.81)
RDW: 16.2 % — ABNORMAL HIGH (ref 11.5–15.5)
WBC: 6.5 10*3/uL (ref 4.0–10.5)
nRBC: 0 % (ref 0.0–0.2)

## 2018-08-03 LAB — BASIC METABOLIC PANEL
ANION GAP: 11 (ref 5–15)
BUN: 39 mg/dL — ABNORMAL HIGH (ref 8–23)
CO2: 25 mmol/L (ref 22–32)
Calcium: 8.6 mg/dL — ABNORMAL LOW (ref 8.9–10.3)
Chloride: 97 mmol/L — ABNORMAL LOW (ref 98–111)
Creatinine, Ser: 7.66 mg/dL — ABNORMAL HIGH (ref 0.61–1.24)
GFR calc Af Amer: 8 mL/min — ABNORMAL LOW (ref >60)
GFR, EST NON AFRICAN AMERICAN: 7 mL/min — AB (ref >60)
GLUCOSE: 98 mg/dL (ref 70–99)
POTASSIUM: 4 mmol/L (ref 3.5–5.1)
Sodium: 133 mmol/L — ABNORMAL LOW (ref 135–145)

## 2018-08-03 MED ORDER — CARVEDILOL 3.125 MG PO TABS
3.1250 mg | ORAL_TABLET | Freq: Two times a day (BID) | ORAL | Status: DC
  Administered 2018-08-03 – 2018-08-08 (×7): 3.125 mg via ORAL
  Filled 2018-08-03 (×7): qty 1

## 2018-08-03 MED ORDER — AMIODARONE HCL 200 MG PO TABS
200.0000 mg | ORAL_TABLET | Freq: Two times a day (BID) | ORAL | Status: DC
  Administered 2018-08-03 – 2018-08-08 (×12): 200 mg via ORAL
  Filled 2018-08-03 (×12): qty 1

## 2018-08-03 MED FILL — Darbepoetin Alfa Soln Prefilled Syringe 200 MCG/0.4ML: INTRAMUSCULAR | Qty: 0.4 | Status: AC

## 2018-08-03 NOTE — Procedures (Signed)
I was present at this dialysis session. I have reviewed the session itself and made appropriate changes.  He is 2 kg below edw and will cont to challenge but will need to get standing weights to be more accurate.  Vital signs in last 24 hours:  Temp:  [97.4 F (36.3 C)-98.1 F (36.7 C)] 98.1 F (36.7 C) (11/13 0400) Resp:  [0-30] 0 (11/13 0400) BP: (93-152)/(64-112) 134/75 (11/13 0400) SpO2:  [95 %-100 %] 99 % (11/13 0400) Weight:  [74.5 kg] 74.5 kg (11/13 0500) Weight change: -2.7 kg Filed Weights   08/01/18 1229 08/02/18 0500 08/03/18 0500  Weight: 74.8 kg 72.8 kg 74.5 kg    Recent Labs  Lab 08/01/18 0605  08/03/18 0423  NA 136   < > 133*  K 3.7   < > 4.0  CL 98   < > 97*  CO2 26   < > 25  GLUCOSE 112*   < > 98  BUN 49*   < > 39*  CREATININE 9.07*   < > 7.66*  CALCIUM 8.1*   < > 8.6*  PHOS 5.8*  --   --    < > = values in this interval not displayed.    Recent Labs  Lab 08/01/18 0605 08/02/18 0509 08/03/18 0423  WBC 6.8 5.8 6.5  HGB 7.9* 7.7* 8.0*  HCT 25.7* 25.5* 26.8*  MCV 96.6 98.1 97.8  PLT 117* 133* 172    Scheduled Meds: . amiodarone  200 mg Oral BID  . amLODipine  10 mg Oral QHS  . aspirin EC  325 mg Oral Daily   Or  . aspirin  324 mg Per Tube Daily  . bictegravir-emtricitabine-tenofovir AF  1 tablet Oral Daily  . bisacodyl  10 mg Oral Daily   Or  . bisacodyl  10 mg Rectal Daily  . calcium acetate  2,001 mg Oral TID WC  . carvedilol  3.125 mg Oral BID WC  . Chlorhexidine Gluconate Cloth  6 each Topical Daily  . cloNIDine  0.3 mg Oral TID  . [START ON 08/08/2018] darbepoetin (ARANESP) injection - DIALYSIS  200 mcg Intravenous Q Mon-HD  . docusate sodium  200 mg Oral Daily  . enoxaparin (LOVENOX) injection  30 mg Subcutaneous Daily  . feeding supplement (PRO-STAT SUGAR FREE 64)  30 mL Oral BID  . hydrALAZINE  100 mg Oral TID  . isosorbide mononitrate  20 mg Oral BID  . lacosamide  150 mg Oral BID  . multivitamin  1 tablet Oral Daily  .  pantoprazole  40 mg Oral Daily  . sodium chloride flush  10-40 mL Intracatheter Q12H  . sodium chloride flush  3 mL Intravenous Q12H   Continuous Infusions: . sodium chloride    . sodium chloride Stopped (07/29/18 1036)  . sodium chloride    . sodium chloride    . magnesium sulfate    . nitroGLYCERIN Stopped (07/31/18 0755)  . phenylephrine (NEO-SYNEPHRINE) Adult infusion Stopped (07/28/18 1703)   PRN Meds:.sodium chloride, sodium chloride, albuterol, ALPRAZolam, hydrALAZINE, lidocaine (PF), lidocaine-prilocaine, metoprolol tartrate, ondansetron (ZOFRAN) IV, oxyCODONE, pentafluoroprop-tetrafluoroeth, sodium chloride flush, sodium chloride flush, traMADol, traZODone   Donetta Potts,  MD 08/03/2018, 8:34 AM

## 2018-08-03 NOTE — Care Management Note (Signed)
Case Management Note  Patient Details  Name: Jeffrey [name] MRN: [id number] Date of Birth: April 29, 1956  Subjective/Objective:  62 yo male presented for AVR. PMH: end-stage renal disease on hemodialysis, recurrent aortic valve endocarditis with severe aortic insufficiency, atherosclerotic cardiovascular disease, HIV, hepatitis B, tubulovillous adenoma of the duodenum, hypertension, hypothyroidism, seizures, and thrombocytopenia.                  Action/Plan: Patient is s/p AVR with PT eval complete and ST SNF recommended. CSW following for placement, with CM to continue following for progress and transitional needs.   Expected Discharge Date:                  Expected Discharge Plan:  Skilled Nursing Facility  In-House Referral:  Clinical Social Work  Discharge planning Services  CM Consult  Post Acute Care Choice:  NA Choice offered to:  NA  DME Arranged:  N/A DME Agency:  NA  HH Arranged:  NA HH Agency:  NA  Status of Service:  In process, will continue to follow  If discussed at Long Length of Stay Meetings, dates discussed:    Additional Comments:  Midge Minium RN, BSN, NCM-BC, ACM-RN [phone] 08/03/2018, 12:41 PM

## 2018-08-03 NOTE — NC FL2 (Signed)
[place] LEVEL OF CARE SCREENING TOOL     IDENTIFICATION  Patient Name: Jeffrey [name] Birthdate: 1956-05-17 Sex: male Admission Date (Current Location): 07/28/2018  [facility] and [place] Number:  Herbalist and Address:  [facility], [place] [address] [place], [place] [address]      Provider Number: [id number]  Attending Physician Name and Address:  [name], MD  Relative Name and Phone Number:       Current Level of Care: Hospital Recommended Level of Care: [place] Prior Approval Number:    Date Approved/Denied:   PASRR Number: [id number]  Discharge Plan: SNF    Current Diagnoses: Patient Active Problem List   Diagnosis Date Noted  . S/P aortic valve replacement with bioprosthetic valve 07/28/2018  . Severe aortic insufficiency 07/28/2018  . Aortic insufficiency 07/25/2018  . Cerebral thrombosis with cerebral infarction 07/13/2018  . Seizure disorder ([place]) 07/10/2018  . Status epilepticus ([place]) 07/10/2018  . Altered mental status 07/09/2018  . Hypertensive urgency 07/04/2018  . OSA (obstructive sleep apnea) 05/31/2018  . Onychomycosis of right great toe 05/09/2018  . 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 Acute bacterial endocarditis   . Left shoulder pain   . 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 Coagulase negative Staphylococcus bacteremia   . Hypervolemia   . ESRD on hemodialysis ([place])   . Asymptomatic HIV infection ([place])   . CAD (coronary artery disease) 04/19/2018  . Dyspnea 04/10/2018  . Loud snoring 04/08/2018  . Upper GI bleed 04/08/2018  . Malnutrition of moderate degree 02/23/2018  . Seizure ([place]) 02/22/2018  . Mass of duodenum   . Antibiotic long-term use   . Septic arthritis ([place]) 09/15/2017  . Encounter for incision and drainage procedure 09/15/2017  . Effusion of left knee 09/08/2017  . Intertrigo 07/09/2017  . AC joint arthropathy  06/16/2017  . Pseudoaneurysm of arteriovenous dialysis fistula ([place]) 12/23/2016  . Eczema 12/10/2016  . Bilateral low back pain without sciatica   . Aortic valve insufficiency   . Fall 09/08/2016  . Knee abrasion, right, initial encounter 09/08/2016  . Lumbosacral spondylosis without myelopathy 08/03/2016  . Pulmonary nodules 08/03/2016  . Essential hypertension   . Anemia of chronic disease 04/08/2016  . Secondary hyperparathyroidism ([place]) 02/09/2014  . Asthma 02/09/2014  . Nuclear sclerotic cataract of both eyes 12/12/2013  . Pruritus 10/26/2013  . Myopia with presbyopia of both eyes 10/26/2013  . COPD (chronic obstructive pulmonary disease) ([place]) 06/27/2013  . Thrombocytopenia ([place]) 01/09/2013  . HBV (hepatitis B virus) infection 01/09/2013  . Anxiety 07/22/2011  . End stage renal disease on home HD 07/10/2011  . HIV disease ([place]) 06/17/2011    Orientation RESPIRATION BLADDER Height & Weight     Self  Normal Continent Weight: 149 lb 14.6 oz (68 kg) Height:  6' (182.9 cm)  BEHAVIORAL SYMPTOMS/MOOD NEUROLOGICAL BOWEL NUTRITION STATUS      Continent Diet(Diet renal/carb modified with fluid restriction Diet-HS Snack? Nothing; Fluid restriction: 1200 mL Fluid, thin liquids)  AMBULATORY STATUS COMMUNICATION OF NEEDS Skin   Limited Assist Verbally PU Stage and Appropriate Care, Surgical wounds(Closed incision, chest and left arm.)   PU Stage 2 Dressing: (Buttocks, foam dressing)                   Personal Care Assistance Level of Assistance  Bathing, Feeding, Dressing Bathing Assistance: Limited assistance Feeding assistance: Independent Dressing Assistance: Limited assistance  Functional Limitations Info  Sight, Hearing, Speech   Hearing Info: Adequate Speech Info: Adequate    SPECIAL CARE FACTORS FREQUENCY  PT (By licensed PT), OT (By licensed OT)     PT Frequency: 3x OT Frequency: 3x            Contractures Contractures Info: Not present     Additional Factors Info  Code Status, Allergies Code Status Info: Full Code Allergies Info: Lisinopril, Penicillins, Fentanyl, Morphine And Related           Current Medications (08/03/2018):  This is the current hospital active medication list Current Facility-Administered Medications  Medication Dose Route Frequency Provider Last Rate Last Dose  . 0.9 %  sodium chloride infusion  250 mL Intravenous Continuous Lars Pinks M, PA-C      . 0.9 %  sodium chloride infusion   Intravenous Continuous [name], MD   Stopped at 07/29/18 1036  . 0.9 %  sodium chloride infusion  100 mL Intravenous PRN Valentina Gu, NP      . 0.9 %  sodium chloride infusion  100 mL Intravenous PRN Valentina Gu, NP      . albuterol (PROVENTIL) (2.5 MG/3ML) 0.083% nebulizer solution 2.5 mg  2.5 mg Nebulization Q6H PRN Nani Skillern, PA-C      . ALPRAZolam Duanne Moron) tablet 0.25 mg  0.25 mg Oral QHS PRN Nani Skillern, PA-C   0.25 mg at 07/30/18 0052  . amiodarone (PACERONE) tablet 200 mg  200 mg Oral BID [name], MD      . amLODipine (NORVASC) tablet 10 mg  10 mg Oral QHS Rosita Fire, MD   10 mg at 08/02/18 2100  . aspirin EC tablet 325 mg  325 mg Oral Daily Lars Pinks M, [place]   325 mg at 08/02/18 9407   Or  . aspirin chewable tablet 324 mg  324 mg Per Tube Daily Lars Pinks M, PA-C      . bictegravir-emtricitabine-tenofovir AF (BIKTARVY) 50-200-25 MG per tablet 1 tablet  1 tablet Oral Daily Nani Skillern, PA-C   1 tablet at 08/02/18 6808  . bisacodyl (DULCOLAX) EC tablet 10 mg  10 mg Oral Daily Lars Pinks M, PA-C   10 mg at 08/02/18 8110   Or  . bisacodyl (DULCOLAX) suppository 10 mg  10 mg Rectal Daily Lars Pinks M, PA-C      . calcium acetate (PHOSLO) capsule 2,001 mg  2,001 mg Oral TID WC Valentina Gu, NP   2,001 mg at 08/02/18 1716  . carvedilol (COREG) tablet 3.125 mg  3.125 mg Oral BID  WC [name], MD      . Chlorhexidine Gluconate Cloth 2 % PADS 6 each  6 each Topical Daily [name], MD   6 each at 08/02/18 0932  . cloNIDine (CATAPRES) tablet 0.3 mg  0.3 mg Oral TID [name], MD   0.3 mg at 08/02/18 2141  . [START ON 08/08/2018] Darbepoetin Alfa (ARANESP) injection 200 mcg  200 mcg Intravenous Q Mon-HD Rosita Fire, MD      . docusate sodium (COLACE) capsule 200 mg  200 mg Oral Daily Lars Pinks M, PA-C   200 mg at 08/02/18 0929  . enoxaparin (LOVENOX) injection 30 mg  30 mg Subcutaneous Daily [name], MD   30 mg at 08/02/18 0929  . feeding supplement (PRO-STAT SUGAR FREE 64) liquid 30 mL  30 mL Oral BID Juanell Fairly  [place], NP   30 mL at 08/02/18 2142  . hydrALAZINE (APRESOLINE) injection 10 mg  10 mg Intravenous Q4H PRN Ivin Poot, MD   10 mg at 07/30/18 1942  . hydrALAZINE (APRESOLINE) tablet 100 mg  100 mg Oral TID [name], MD   100 mg at 08/02/18 2141  . isosorbide mononitrate (ISMO,MONOKET) tablet 20 mg  20 mg Oral BID [name], MD   20 mg at 08/02/18 2142  . lacosamide (VIMPAT) tablet 150 mg  150 mg Oral BID [name], MD   150 mg at 08/02/18 2141  . lidocaine (PF) (XYLOCAINE) 1 % injection 5 mL  5 mL Intradermal PRN Valentina Gu, NP      . lidocaine-prilocaine (EMLA) cream 1 application  1 application Topical PRN Valentina Gu, NP      . magnesium sulfate IVPB 4 g 100 mL  4 g Intravenous Once Lars Pinks M, PA-C      . metoprolol tartrate (LOPRESSOR) injection 2.5-5 mg  2.5-5 mg Intravenous Q2H PRN Lars Pinks M, PA-C   5 mg at 08/01/18 0636  . multivitamin (RENA-VIT) tablet 1 tablet  1 tablet Oral Daily Nani Skillern, PA-C   1 tablet at 08/02/18 2141  . nitroGLYCERIN 50 mg in dextrose 5 % 250 mL (0.2 mg/mL) infusion  0-100 mcg/min Intravenous Titrated Nani Skillern, PA-C   Stopped at 07/31/18 0755  .  ondansetron (ZOFRAN) injection 4 mg  4 mg Intravenous Q6H PRN Nani Skillern, PA-C   4 mg at 07/29/18 1203  . oxyCODONE (Oxy IR/ROXICODONE) immediate release tablet 5 mg  5 mg Oral Q6H PRN [name], MD   5 mg at 08/02/18 2327  . pantoprazole (PROTONIX) EC tablet 40 mg  40 mg Oral Daily Lars Pinks M, PA-C   40 mg at 08/02/18 8182  . pentafluoroprop-tetrafluoroeth (GEBAUERS) aerosol 1 application  1 application Topical PRN Valentina Gu, NP      . phenylephrine (NEOSYNEPHRINE) 20-0.9 MG/250ML-% infusion  0-100 mcg/min Intravenous Titrated Nani Skillern, PA-C   Stopped at 07/28/18 1703  . sodium chloride flush (NS) 0.9 % injection 10-40 mL  10-40 mL Intracatheter Q12H [name], MD   10 mL at 08/02/18 2105  . sodium chloride flush (NS) 0.9 % injection 10-40 mL  10-40 mL Intracatheter PRN [name], MD      . sodium chloride flush (NS) 0.9 % injection 3 mL  3 mL Intravenous Q12H Lars Pinks M, PA-C   3 mL at 08/02/18 2106  . sodium chloride flush (NS) 0.9 % injection 3 mL  3 mL Intravenous PRN Lars Pinks M, PA-C      . traMADol Veatrice Bourbon) tablet 50-100 mg  50-100 mg Oral Q12H PRN Lars Pinks M, PA-C   100 mg at 08/02/18 1949  . traZODone (DESYREL) tablet 50 mg  50 mg Oral QHS PRN [name], MD   50 mg at 08/02/18 2327   Facility-Administered Medications Ordered in Other Encounters  Medication Dose Route Frequency Provider Last Rate Last Dose  . etomidate (AMIDATE) injection    Anesthesia Intra-op Myna Bright, CRNA   16 mg at 07/10/18 2031  . rocuronium bromide 10 mg/mL (PF) syringe   Intravenous Anesthesia Intra-op Myna Bright, CRNA   50 mg at 07/10/18 2031     Discharge Medications: Please see discharge summary for a list of discharge medications.  Relevant Imaging Results:  Relevant Lab Results:  Additional Information SSN; [id number]  Eileen Stanford, LCSW

## 2018-08-03 NOTE — Progress Notes (Signed)
6 Days Post-Op Procedure(s) (LRB): AORTIC VALVE REPLACEMENT (AVR) using Inspiris Resilia  size 23 Aortic Valve. (N/A) TRANSESOPHAGEAL ECHOCARDIOGRAM (TEE) (N/A) Subjective: On HD this AM Pain better Appetite good Worked with PT yesterday  Objective: Vital signs in last 24 hours: Temp:  [97.4 F (36.3 C)-98.1 F (36.7 C)] 98.1 F (36.7 C) (11/13 0400) Cardiac Rhythm: Atrial paced;Sinus bradycardia (11/13 0300) Resp:  [0-30] 0 (11/13 0400) BP: (93-152)/(64-112) 134/75 (11/13 0400) SpO2:  [95 %-100 %] 99 % (11/13 0400) Weight:  [74.5 kg] 74.5 kg (11/13 0500)  Hemodynamic parameters for last 24 hours:    Intake/Output from previous day: 11/12 0701 - 11/13 0700 In: 1098.6 [P.O.:720; I.V.:378.6] Out: -  Intake/Output this shift: No intake/output data recorded.  General appearance: alert, cooperative and no distress Neurologic: intact Heart: brady, regular Lungs: diminished breath sounds bibasilar Wound: clean and dry  Lab Results: Recent Labs    08/02/18 0509 08/03/18 0423  WBC 5.8 6.5  HGB 7.7* 8.0*  HCT 25.5* 26.8*  PLT 133* 172   BMET:  Recent Labs    08/02/18 0509 08/03/18 0423  NA 135 133*  K 3.5 4.0  CL 99 97*  CO2 26 25  GLUCOSE 125* 98  BUN 28* 39*  CREATININE 5.99* 7.66*  CALCIUM 7.8* 8.6*    PT/INR: No results for input(s): LABPROT, INR in the last 72 hours. ABG    Component Value Date/Time   PHART 7.333 (L) 07/29/2018 0439   HCO3 20.5 07/29/2018 0439   TCO2 21 (L) 07/29/2018 1609   ACIDBASEDEF 5.0 (H) 07/29/2018 0439   O2SAT 92.0 07/29/2018 0439   CBG (last 3)  No results for input(s): GLUCAP in the last 72 hours.  Assessment/Plan: S/P Procedure(s) (LRB): AORTIC VALVE REPLACEMENT (AVR) using Inspiris Resilia  size 23 Aortic Valve. (N/A) TRANSESOPHAGEAL ECHOCARDIOGRAM (TEE) (N/A) -POD # 6 Looks better today CV- sinus brady- will decrease amio and coreg  Hypertension- HD to take off fluid, continue meds RESP- left effusion, may be  slightly better today, may need thoracentesis at some point RENAL- ESRD_ on HD GI- tolerating PO SCD + enoxaparin for DVT prophylaxis Deconditioning- continue to mobilize, PT   LOS: 6 days    Jeffrey [name] 08/03/2018

## 2018-08-03 NOTE — Plan of Care (Signed)
  Problem: Education: Goal: Will demonstrate proper wound care and an understanding of methods to prevent future damage Outcome: Progressing Goal: Knowledge of disease or condition will improve Outcome: Progressing Goal: Knowledge of the prescribed therapeutic regimen will improve Outcome: Progressing   Problem: Activity: Goal: Risk for activity intolerance will decrease Outcome: Progressing   Problem: Cardiac: Goal: Will achieve and/or maintain hemodynamic stability Outcome: Progressing   Problem: Clinical Measurements: Goal: Postoperative complications will be avoided or minimized Outcome: Progressing   Problem: Respiratory: Goal: Respiratory status will improve Outcome: Progressing   Problem: Skin Integrity: Goal: Wound healing without signs and symptoms of infection Outcome: Progressing Goal: Risk for impaired skin integrity will decrease Outcome: Progressing

## 2018-08-03 NOTE — Progress Notes (Signed)
      [address]       [place] [address]             [phone]      Had HD again today Refused to get OOB BP (!) 139/100   Pulse (!) 55   Temp 99 F (37.2 C) (Oral)   Resp 15   Ht 6' (1.829 m)   Wt 66.4 kg   SpO2 97%   BMI 19.85 kg/m   Intake/Output Summary (Last 24 hours) at 08/03/2018 1745 Last data filed at 08/03/2018 1132 Gross per 24 hour  Intake 580 ml  Output 2000 ml  Net -1420 ml   Transfer to stepdown tomorrow  Remo Lipps C. Roxan Hockey, MD [facility] [phone]

## 2018-08-03 NOTE — Progress Notes (Signed)
MD orders for to remove 2 liters (edw 70.2kg) pt. Pre-dialysis tx  weight 68kg, Dr. Marval Regal aware ok to continue with current orders.

## 2018-08-04 LAB — BASIC METABOLIC PANEL
ANION GAP: 9 (ref 5–15)
BUN: 20 mg/dL (ref 8–23)
CALCIUM: 7.1 mg/dL — AB (ref 8.9–10.3)
CHLORIDE: 104 mmol/L (ref 98–111)
CO2: 24 mmol/L (ref 22–32)
CREATININE: 4.49 mg/dL — AB (ref 0.61–1.24)
GFR, EST AFRICAN AMERICAN: 15 mL/min — AB (ref >60)
GFR, EST NON AFRICAN AMERICAN: 13 mL/min — AB (ref >60)
Glucose, Bld: 91 mg/dL (ref 70–99)
Potassium: 3.1 mmol/L — ABNORMAL LOW (ref 3.5–5.1)
SODIUM: 137 mmol/L (ref 135–145)

## 2018-08-04 LAB — CBC
HEMATOCRIT: 28.7 % — AB (ref 39.0–52.0)
HEMOGLOBIN: 8.7 g/dL — AB (ref 13.0–17.0)
MCH: 29.3 pg (ref 26.0–34.0)
MCHC: 30.3 g/dL (ref 30.0–36.0)
MCV: 96.6 fL (ref 80.0–100.0)
NRBC: 0 % (ref 0.0–0.2)
Platelets: 200 10*3/uL (ref 150–400)
RBC: 2.97 MIL/uL — ABNORMAL LOW (ref 4.22–5.81)
RDW: 16.1 % — ABNORMAL HIGH (ref 11.5–15.5)
WBC: 7.3 10*3/uL (ref 4.0–10.5)

## 2018-08-04 MED ORDER — SODIUM CHLORIDE 0.9% FLUSH: 3.0000 mL | INTRAVENOUS | Status: DC | PRN

## 2018-08-04 MED ORDER — MOVING RIGHT ALONG BOOK
Freq: Once | Status: AC
  Administered 2018-08-04: 13:00:00
  Filled 2018-08-04: qty 1

## 2018-08-04 MED ORDER — POTASSIUM CHLORIDE CRYS ER 20 MEQ PO TBCR
40.0000 meq | EXTENDED_RELEASE_TABLET | Freq: Once | ORAL | Status: AC
  Administered 2018-08-04: 40 meq via ORAL
  Filled 2018-08-04: qty 2

## 2018-08-04 MED ORDER — SODIUM CHLORIDE 0.9% FLUSH
3.0000 mL | Freq: Two times a day (BID) | INTRAVENOUS | Status: DC
  Administered 2018-08-04 – 2018-08-20 (×29): 3 mL via INTRAVENOUS

## 2018-08-04 MED ORDER — POTASSIUM CHLORIDE 20 MEQ PO PACK: 40.0000 meq | PACK | Freq: Once | ORAL | Status: DC

## 2018-08-04 MED ORDER — SODIUM CHLORIDE 0.9 % IV SOLN: 100.0000 mL | INTRAVENOUS | Status: DC | PRN

## 2018-08-04 MED ORDER — SODIUM CHLORIDE 0.9 % IV SOLN: 250.0000 mL | INTRAVENOUS | Status: DC | PRN

## 2018-08-04 NOTE — Progress Notes (Signed)
S:Feels good, no complaints O:BP (!) 172/118   Pulse (!) 55   Temp 97.8 F (36.6 C) (Oral)   Resp (!) 21   Ht 6' (1.829 m)   Wt 73.5 kg Comment: standing weight  SpO2 95%   BMI 21.98 kg/m   Intake/Output Summary (Last 24 hours) at 08/04/2018 0900 Last data filed at 08/04/2018 0800 Gross per 24 hour  Intake 480 ml  Output 2000 ml  Net -1520 ml   Intake/Output: I/O last 3 completed shifts: In: 560 [P.O.:480; I.V.:80] Out: 2000 [Other:2000]  Intake/Output this shift:  Total I/O In: 480 [P.O.:480] Out: -  Weight change: -6.5 kg Gen: NAD CVS: bradycardic, no rub Resp: cta Abd: benign Ext: no edema, LAVF +T/B  Recent Labs  Lab 07/28/18 2213  07/30/18 1020 07/30/18 1941 07/31/18 0359 08/01/18 0605 08/02/18 0509 08/03/18 0423 08/04/18 0345  NA 132*   < > 133* 136 137 136 135 133* 137  K 5.5*   < > 5.4* 3.5 3.9 3.7 3.5 4.0 3.1*  CL 101   < > 100 98 100 98 99 97* 104  CO2 19*   < > 19* 27 26 26 26 25 24   GLUCOSE 123*   < > 104* 87 85 112* 125* 98 91  BUN 52*   < > 69* 28* 33* 49* 28* 39* 20  CREATININE 9.37*   < > 10.92* 5.76* 6.92* 9.07* 5.99* 7.66* 4.49*  ALBUMIN 2.9*  --  2.7* 2.6* 2.5* 2.5*  --   --   --   CALCIUM 8.2*   < > 8.4* 7.8* 8.1* 8.1* 7.8* 8.6* 7.1*  PHOS 4.3  --  6.1* 3.9 5.0* 5.8*  --   --   --    < > = values in this interval not displayed.   Liver Function Tests: Recent Labs  Lab 07/30/18 1941 07/31/18 0359 08/01/18 0605  ALBUMIN 2.6* 2.5* 2.5*   No results for input(s): LIPASE, AMYLASE in the last 168 hours. No results for input(s): AMMONIA in the last 168 hours. CBC: Recent Labs  Lab 07/31/18 0359 08/01/18 0605 08/02/18 0509 08/03/18 0423 08/04/18 0345  WBC 7.8 6.8 5.8 6.5 7.3  HGB 7.9* 7.9* 7.7* 8.0* 8.7*  HCT 26.0* 25.7* 25.5* 26.8* 28.7*  MCV 96.3 96.6 98.1 97.8 96.6  PLT 92* 117* 133* 172 200   Cardiac Enzymes: No results for input(s): CKTOTAL, CKMB, CKMBINDEX, TROPONINI in the last 168 hours. CBG: Recent Labs  Lab  07/29/18 2333 07/30/18 0402 07/30/18 0756 07/30/18 1202 07/30/18 1845  GLUCAP 100* 104* 99 93 80    Iron Studies: No results for input(s): IRON, TIBC, TRANSFERRIN, FERRITIN in the last 72 hours. Studies/Results: Dg Chest Port 1 View  Result Date: 08/03/2018 CLINICAL DATA:  Post AVR, chest soreness EXAM: PORTABLE CHEST 1 VIEW COMPARISON:  08/02/2018 FINDINGS: Postoperative changes from aortic valve replacement. Cardiomegaly with vascular congestion and mild pulmonary edema. Probable small effusions. Bibasilar atelectasis or infiltrates, left greater than right. No real change since prior study. No pneumothorax. IMPRESSION: Postoperative changes. Cardiomegaly with vascular congestion and mild pulmonary edema. Small effusions with bibasilar atelectasis or infiltrates, left greater than right. No pneumothorax. Electronically Signed   By: Rolm Baptise M.D.   On: 08/03/2018 08:41   . amiodarone  200 mg Oral BID  . amLODipine  10 mg Oral QHS  . aspirin EC  325 mg Oral Daily   Or  . aspirin  324 mg Per Tube Daily  . bictegravir-emtricitabine-tenofovir AF  1 tablet Oral Daily  . bisacodyl  10 mg Oral Daily   Or  . bisacodyl  10 mg Rectal Daily  . calcium acetate  2,001 mg Oral TID WC  . carvedilol  3.125 mg Oral BID WC  . Chlorhexidine Gluconate Cloth  6 each Topical Daily  . cloNIDine  0.3 mg Oral TID  . [START ON 08/08/2018] darbepoetin (ARANESP) injection - DIALYSIS  200 mcg Intravenous Q Mon-HD  . docusate sodium  200 mg Oral Daily  . enoxaparin (LOVENOX) injection  30 mg Subcutaneous Daily  . feeding supplement (PRO-STAT SUGAR FREE 64)  30 mL Oral BID  . hydrALAZINE  100 mg Oral TID  . isosorbide mononitrate  20 mg Oral BID  . lacosamide  150 mg Oral BID  . multivitamin  1 tablet Oral Daily  . pantoprazole  40 mg Oral Daily  . sodium chloride flush  10-40 mL Intracatheter Q12H  . sodium chloride flush  3 mL Intravenous Q12H    BMET    Component Value Date/Time   NA 137  08/04/2018 0345   NA 136 11/18/2016 1036   K 3.1 (L) 08/04/2018 0345   K 3.3 (L) 11/18/2016 1036   CL 104 08/04/2018 0345   CO2 24 08/04/2018 0345   CO2 29 11/18/2016 1036   GLUCOSE 91 08/04/2018 0345   GLUCOSE 95 11/18/2016 1036   BUN 20 08/04/2018 0345   BUN 25.7 11/18/2016 1036   CREATININE 4.49 (H) 08/04/2018 0345   CREATININE 10.64 (H) 11/01/2017 1008   CREATININE 6.6 (HH) 11/18/2016 1036   CALCIUM 7.1 (L) 08/04/2018 0345   CALCIUM 9.5 11/18/2016 1036   GFRNONAA 13 (L) 08/04/2018 0345   GFRNONAA 3 (L) 02/10/2017 1619   GFRAA 15 (L) 08/04/2018 0345   GFRAA <4 (L) 02/10/2017 1619   CBC    Component Value Date/Time   WBC 7.3 08/04/2018 0345   RBC 2.97 (L) 08/04/2018 0345   HGB 8.7 (L) 08/04/2018 0345   HGB 9.7 (L) 05/09/2018 0937   HGB 10.0 (L) 11/18/2016 1036   HCT 28.7 (L) 08/04/2018 0345   HCT 29.8 (L) 05/09/2018 0937   HCT 29.9 (L) 11/18/2016 1036   PLT 200 08/04/2018 0345   PLT 180 05/09/2018 0937   MCV 96.6 08/04/2018 0345   MCV 91 05/09/2018 0937   MCV 95.6 11/18/2016 1036   MCH 29.3 08/04/2018 0345   MCHC 30.3 08/04/2018 0345   RDW 16.1 (H) 08/04/2018 0345   RDW 16.9 (H) 05/09/2018 0937   RDW 18.1 (H) 11/18/2016 1036   LYMPHSABS 0.8 07/15/2018 0558   LYMPHSABS 1.2 11/18/2016 1036   MONOABS 0.4 07/15/2018 0558   MONOABS 0.6 11/18/2016 1036   EOSABS 0.9 (H) 07/15/2018 0558   EOSABS 0.3 11/18/2016 1036   BASOSABS 0.0 07/15/2018 0558   BASOSABS 0.1 11/18/2016 1036    Dialysis Orders:Home Dialysis patient 4 days weekly Getting records from [facility] 70.2 kg   Assessment/ Plan:   1. Severe AI S/P AVR 07/28/18-per CT surgery 2. AFib-post op AVR.  Now on amiodarone per Dr. Servando Snare. 3. Seizure disorder-Vimpat resumed 4. HIV-continue meds per primary 5. ESRD - Home HD 4 days a week. K+ 5.5. Use 2.0 K bath, no heparin.Use MWF schedule. HD tomorrow. 6. Hypertension/volume -post weights are quite variable with swings from 66.4kg to  73.5kg.  Cannot trust bed scales so will have HD in HD unit and stand for pre and post weights to get a more accurate measurement.  Will cont to UF as BP tolerates (he can go up for HD tomorrow).  7. Anemia - HGB 7.4 on adm.8.7today. No transfusion post op. GivenAranesp 200 mcg IV 07/30/18. Follow HGB and transfuse if HGB 7.0 or less. 8. Metabolic bone disease - Ca 8.4 C Ca 9.4 Phos5.0. Resume binders. Getting orders from HD clinic for VDRA. 9. Nutrition - Albumin 2.7 Change diet to renal/carb mod diet with prostat, renal vits.  10. DM-Per primary.  Donetta Potts, MD [employer] [phone]

## 2018-08-04 NOTE — Progress Notes (Signed)
7 Days Post-Op Procedure(s) (LRB): AORTIC VALVE REPLACEMENT (AVR) using Inspiris Resilia  size 23 Aortic Valve. (N/A) TRANSESOPHAGEAL ECHOCARDIOGRAM (TEE) (N/A) Subjective: Ambulated across room this AM  Objective: Vital signs in last 24 hours: Temp:  [97.6 F (36.4 C)-99 F (37.2 C)] 97.8 F (36.6 C) (11/14 0807) Pulse Rate:  [51-59] 55 (11/13 1132) Cardiac Rhythm: Normal sinus rhythm (11/14 0000) Resp:  [0-22] 21 (11/14 0700) BP: (116-172)/(56-126) 172/118 (11/14 0807) SpO2:  [94 %-100 %] 99 % (11/14 0700) Weight:  [66.4 kg-73.5 kg] 73.5 kg (11/14 0500)  Hemodynamic parameters for last 24 hours:    Intake/Output from previous day: 11/13 0701 - 11/14 0700 In: -  Out: 2000  Intake/Output this shift: No intake/output data recorded.  General appearance: alert, cooperative and no distress Neurologic: intact Heart: regularly irregular rhythm Lungs: diminished breath sounds left base Abdomen: normal findings: soft, non-tender Wound: clean and dry  Lab Results: Recent Labs    08/03/18 0423 08/04/18 0345  WBC 6.5 7.3  HGB 8.0* 8.7*  HCT 26.8* 28.7*  PLT 172 200   BMET:  Recent Labs    08/03/18 0423 08/04/18 0345  NA 133* 137  K 4.0 3.1*  CL 97* 104  CO2 25 24  GLUCOSE 98 91  BUN 39* 20  CREATININE 7.66* 4.49*  CALCIUM 8.6* 7.1*    PT/INR: No results for input(s): LABPROT, INR in the last 72 hours. ABG    Component Value Date/Time   PHART 7.333 (L) 07/29/2018 0439   HCO3 20.5 07/29/2018 0439   TCO2 21 (L) 07/29/2018 1609   ACIDBASEDEF 5.0 (H) 07/29/2018 0439   O2SAT 92.0 07/29/2018 0439   CBG (last 3)  No results for input(s): GLUCAP in the last 72 hours.  Assessment/Plan: S/P Procedure(s) (LRB): AORTIC VALVE REPLACEMENT (AVR) using Inspiris Resilia  size 23 Aortic Valve. (N/A) TRANSESOPHAGEAL ECHOCARDIOGRAM (TEE) (N/A) Plan for transfer to step-down: see transfer orders  CV- Bp up this AM-   Atrial fib- in SR with PACs- will give coreg early,  continue amiodarone   RESP- continue IS, check PA and lateral CXR in AM to f/u left effusion/ atelectasis  RENAL- hypokalemia- will defer to renal for supplementation  GI- tolerating PO  SCD + enoxaparin for DVT prophylaxis  Deconditioning- primary issue, continue PT   LOS: 7 days    [name] 08/04/2018

## 2018-08-04 NOTE — Progress Notes (Signed)
CT surgery p.m. Rounds  Patient resting comfortably Sinus bradycardia No complaints

## 2018-08-04 NOTE — Clinical Social Work Note (Signed)
Clinical Social Work Assessment  Patient Details  Name: Jeffrey [name] MRN: [id number] Date of Birth: 10-12-1955  Date of referral:  08/04/18               Reason for consult:  Facility Placement                Permission sought to share information with:    Permission granted to share information::     Name::        Agency::     Relationship::     Contact Information:     Housing/Transportation Living arrangements for the past 2 months:  Single Family Home Source of Information:  Patient Patient Interpreter Needed:  None Criminal Activity/Legal Involvement Pertinent to Current Situation/Hospitalization:  No - Comment as needed Significant Relationships:  Siblings Lives with:  Siblings Do you feel safe going back to the place where you live?  Yes Need for family participation in patient care:  Yes (Comment)  Care giving concerns:  Pt is alert and oriented and independent at baseline.   Social Worker assessment / plan:  CSW spoke with pt at bedside. Pt states he lives home with his brother and states his brother can assist in his care after d/c. Pt states his brother works night shift therefore would be home with him during the day. Pt did not agree to SNF at d/c. Pt states this information regarding SNF was "bombarding" him. CSW suggest that pt think about SNF because PT recommendation is SNF for safety. Pt states he will think about it. CSW to follow up tomorrow AM.  Employment status:  Disabled (Comment on whether or not currently receiving Disability) Insurance information:  Medicare PT Recommendations:  [place] / Referral to community resources:  [place]  Patient/Family's Response to care:  Pt verbalized understanding of CSW role and expressed appreciation for support. Pt denies any concern regarding pt care at this time.   Patient/Family's Understanding of and Emotional Response to Diagnosis, Current Treatment, and Prognosis:   Pt's understanding regarding physical limitations. Pt is not agreeable to SNF at the present time. Pt states he will think about it and CSW will follow up in AM.  Pt's responses emotionally appropriate during conversation with CSW. Pt denies any concern regarding treatment plan at this time. CSW will continue to provide support and facilitate d/c needs.   Emotional Assessment Appearance:  Appears stated age Attitude/Demeanor/Rapport:  (Patient was appropriate) Affect (typically observed):  Accepting, Appropriate, Calm, Flat Orientation:  Oriented to Self, Oriented to Place, Oriented to  Time, Oriented to Situation Alcohol / Substance use:  Not Applicable Psych involvement (Current and /or in the community):  No (Comment)  Discharge Needs  Concerns to be addressed:  Basic Needs, Care Coordination, Patient refuses services Readmission within the last 30 days:  Yes Current discharge risk:  Dependent with Mobility Barriers to Discharge:  Continued Medical Work up   [employer], LCSW 08/04/2018, 1:21 PM

## 2018-08-04 NOTE — Progress Notes (Addendum)
Physical Therapy Treatment Patient Details Name: Jeffrey [name] MRN: [id number] DOB: November 04, 1955 Today's Date: 08/04/2018    History of Present Illness 62 y.o. male admitted on 07/09/18 for seizure with AMS/increased somnolence and missed HD, CXR showed plural effusion, and CT was negative.  Pt also with anemia/thrombocytopenia, hypertensive urgency.  Pt with seizure during HD on 07/10/18 and code blue called due to loss of pulse.  Pt was intubated and transferred to ICU (intubated 10/20-10/24/19).  Pt with other significant PMH of HIV, Hep B, ESRD on HD (at home), aortic valve stenosis, I and D of L knee, multiple AVF placements/revisions.    PT Comments    Treatment limited to bed/low level exercises secondary to elevated BP 159/140. RN aware and states MD notified. Pt still with limited participation in therapeutic exercises and requires increased encouragement. Pt with cognitive deficits including decreased orientation, awareness, and problem solving skills. Seems to have paranoid and disjointed/tangential thoughts, stating, "you are tricking me," and "can you take me to my room now?" Will progress as tolerated.    Follow Up Recommendations  SNF;Supervision for mobility/OOB     Equipment Recommendations  None recommended by PT    Recommendations for Other Services       Precautions / Restrictions Precautions Precautions: Fall Restrictions Weight Bearing Restrictions: Yes(sternal precautions)    Mobility  Bed Mobility               General bed mobility comments: deferred secondary to BP  Transfers                 General transfer comment: deferred  Ambulation/Gait             General Gait Details: deferred   Stairs             Wheelchair Mobility    Modified Rankin (Stroke Patients Only)       Balance                                            Cognition Arousal/Alertness: Awake/alert Behavior During Therapy: WFL for  tasks assessed/performed Overall Cognitive Status: Impaired/Different from baseline Area of Impairment: Memory;Problem solving;Orientation;Attention;Following commands;Awareness                 Orientation Level: Disoriented to;Place;Time;Situation Current Attention Level: Sustained Memory: Decreased short-term memory Following Commands: Follows one step commands inconsistently   Awareness: Intellectual Problem Solving: Slow processing General Comments: Pt stating, "will you take me to my room?" as he was lying in bed. Paranoid thoughts and stating, "you're playing tricks on me."       Exercises Low Level/ICU Exercises Quad Sets: 5 reps;Both;Supine Short Arc Quad: 10 reps;Both;Supine;AAROM Hip ABduction/ADduction: 10 reps;Both;AAROM;Supine Heel Slides: 15 reps;Both;Supine Stabilized Bridging: 10 reps;Supine    General Comments        Pertinent Vitals/Pain Pain Assessment: No/denies pain    Home Living                      Prior Function            PT Goals (current goals can now be found in the care plan section) Acute Rehab PT Goals Patient Stated Goal: to go home today PT Goal Formulation: With patient Time For Goal Achievement: 08/01/18 Potential to Achieve Goals: Good Progress towards PT goals: Not progressing toward goals - comment(BP too  high)    Frequency    Min 3X/week      PT Plan Current plan remains appropriate    Co-evaluation              AM-PAC PT "6 Clicks" Daily Activity  Outcome Measure  Difficulty turning over in bed (including adjusting bedclothes, sheets and blankets)?: Unable Difficulty moving from lying on back to sitting on the side of the bed? : Unable Difficulty sitting down on and standing up from a chair with arms (e.g., wheelchair, bedside commode, etc,.)?: Unable Help needed moving to and from a bed to chair (including a wheelchair)?: A Lot Help needed walking in hospital room?: A Lot Help needed  climbing 3-5 steps with a railing? : A Lot 6 Click Score: 9    End of Session   Activity Tolerance: Treatment limited secondary to medical complications (Comment) Patient left: in bed;with call bell/phone within reach;with bed alarm set Nurse Communication: Mobility status PT Visit Diagnosis: Muscle weakness (generalized) (M62.81);Difficulty in walking, not elsewhere classified (R26.2)     Time: 5248-1859 PT Time Calculation (min) (ACUTE ONLY): 20 min  Charges:  $Therapeutic Exercise: 8-22 mins                    Jeffrey [name], PT, DPT Acute Rehabilitation Services Pager [phone] Office [phone]   [name] 08/04/2018, 4:30 PM

## 2018-08-05 ENCOUNTER — Inpatient Hospital Stay (HOSPITAL_COMMUNITY): Payer: Medicare Other

## 2018-08-05 LAB — RENAL FUNCTION PANEL
Albumin: 2.5 g/dL — ABNORMAL LOW (ref 3.5–5.0)
Anion gap: 10 (ref 5–15)
BUN: 34 mg/dL — ABNORMAL HIGH (ref 8–23)
CALCIUM: 8.8 mg/dL — AB (ref 8.9–10.3)
CHLORIDE: 96 mmol/L — AB (ref 98–111)
CO2: 27 mmol/L (ref 22–32)
CREATININE: 7.26 mg/dL — AB (ref 0.61–1.24)
GFR calc non Af Amer: 7 mL/min — ABNORMAL LOW (ref >60)
GFR, EST AFRICAN AMERICAN: 8 mL/min — AB (ref >60)
Glucose, Bld: 112 mg/dL — ABNORMAL HIGH (ref 70–99)
Phosphorus: 3 mg/dL (ref 2.5–4.6)
Potassium: 4.3 mmol/L (ref 3.5–5.1)
SODIUM: 133 mmol/L — AB (ref 135–145)

## 2018-08-05 LAB — CBC
HCT: 29 % — ABNORMAL LOW (ref 39.0–52.0)
Hemoglobin: 9 g/dL — ABNORMAL LOW (ref 13.0–17.0)
MCH: 29.6 pg (ref 26.0–34.0)
MCHC: 31 g/dL (ref 30.0–36.0)
MCV: 95.4 fL (ref 80.0–100.0)
PLATELETS: 214 10*3/uL (ref 150–400)
RBC: 3.04 MIL/uL — ABNORMAL LOW (ref 4.22–5.81)
RDW: 15.9 % — AB (ref 11.5–15.5)
WBC: 8.8 10*3/uL (ref 4.0–10.5)
nRBC: 0 % (ref 0.0–0.2)

## 2018-08-05 MED ORDER — POLYETHYLENE GLYCOL 3350 17 G PO PACK
17.0000 g | PACK | Freq: Every day | ORAL | Status: DC | PRN
  Administered 2018-08-06 – 2018-08-17 (×4): 17 g via ORAL
  Filled 2018-08-05 (×4): qty 1

## 2018-08-05 MED ORDER — ACETAMINOPHEN 325 MG PO TABS
650.0000 mg | ORAL_TABLET | Freq: Four times a day (QID) | ORAL | Status: DC | PRN
  Administered 2018-08-08 – 2018-08-09 (×2): 650 mg via ORAL
  Filled 2018-08-05 (×2): qty 2

## 2018-08-05 MED ORDER — HYDROMORPHONE HCL 2 MG PO TABS
1.0000 mg | ORAL_TABLET | Freq: Four times a day (QID) | ORAL | Status: DC | PRN
  Administered 2018-08-08 – 2018-08-17 (×11): 2 mg via ORAL
  Filled 2018-08-05 (×10): qty 1

## 2018-08-05 NOTE — Care Management Important Message (Signed)
Important Message  Patient Details  Name: Jeffrey [name] MRN: [id number] Date of Birth: 23-Aug-1956   Medicare Important Message Given:  Yes    Jeffrey [name] Montine Circle 08/05/2018, 3:51 PM

## 2018-08-05 NOTE — Progress Notes (Signed)
[address]       [place] [address]             [phone]      8 Days Post-Op Procedure(s) (LRB): AORTIC VALVE REPLACEMENT (AVR) using Inspiris Resilia  size 23 Aortic Valve. (N/A) TRANSESOPHAGEAL ECHOCARDIOGRAM (TEE) (N/A) Subjective: Just returned from HD  Objective: Vital signs in last 24 hours: Temp:  [97.6 F (36.4 C)-98.9 F (37.2 C)] 98.9 F (37.2 C) (11/15 0818) Pulse Rate:  [42-116] 73 (11/15 1130) Cardiac Rhythm: Heart block (11/15 0700) Resp:  [14-25] 15 (11/15 0818) BP: (134-165)/(56-140) 134/79 (11/15 1130) SpO2:  [92 %-100 %] 95 % (11/15 0818) Weight:  [72.7 kg] 72.7 kg (11/15 0818)  Hemodynamic parameters for last 24 hours:    Intake/Output from previous day: 11/14 0701 - 11/15 0700 In: 700 [P.O.:700] Out: -  Intake/Output this shift: No intake/output data recorded.  General appearance: alert, cooperative and no distress Heart: irregularly irregular rhythm and 2/6 systolic murmur Lungs: dim left>right base Abdomen: soft, non-tender Extremities: no significant edema Wound: incis healing well  Lab Results: Recent Labs    08/04/18 0345 08/05/18 0234  WBC 7.3 8.8  HGB 8.7* 9.0*  HCT 28.7* 29.0*  PLT 200 214   BMET:  Recent Labs    08/04/18 0345 08/05/18 0234  NA 137 133*  K 3.1* 4.3  CL 104 96*  CO2 24 27  GLUCOSE 91 112*  BUN 20 34*  CREATININE 4.49* 7.26*  CALCIUM 7.1* 8.8*    PT/INR: No results for input(s): LABPROT, INR in the last 72 hours. ABG    Component Value Date/Time   PHART 7.333 (L) 07/29/2018 0439   HCO3 20.5 07/29/2018 0439   TCO2 21 (L) 07/29/2018 1609   ACIDBASEDEF 5.0 (H) 07/29/2018 0439   O2SAT 92.0 07/29/2018 0439   CBG (last 3)  No results for input(s): GLUCAP in the last 72 hours.  Meds Scheduled Meds: . amiodarone  200 mg Oral BID  . amLODipine  10 mg Oral QHS  . aspirin EC  325 mg Oral Daily   Or  . aspirin  324 mg Per Tube Daily  . bictegravir-emtricitabine-tenofovir  AF  1 tablet Oral Daily  . bisacodyl  10 mg Oral Daily   Or  . bisacodyl  10 mg Rectal Daily  . calcium acetate  2,001 mg Oral TID WC  . carvedilol  3.125 mg Oral BID WC  . cloNIDine  0.3 mg Oral TID  . [START ON 08/08/2018] darbepoetin (ARANESP) injection - DIALYSIS  200 mcg Intravenous Q Mon-HD  . enoxaparin (LOVENOX) injection  30 mg Subcutaneous Daily  . feeding supplement (PRO-STAT SUGAR FREE 64)  30 mL Oral BID  . hydrALAZINE  100 mg Oral TID  . isosorbide mononitrate  20 mg Oral BID  . lacosamide  150 mg Oral BID  . multivitamin  1 tablet Oral Daily  . pantoprazole  40 mg Oral Daily  . sodium chloride flush  3 mL Intravenous Q12H   Continuous Infusions: . sodium chloride    . sodium chloride    . sodium chloride    . sodium chloride    . sodium chloride     PRN Meds:.sodium chloride, sodium chloride, sodium chloride, sodium chloride, sodium chloride, acetaminophen, albuterol, ALPRAZolam, hydrALAZINE, HYDROmorphone, lidocaine (PF), lidocaine-prilocaine, metoprolol tartrate, ondansetron (ZOFRAN) IV, pentafluoroprop-tetrafluoroeth, sodium chloride flush, traMADol, traZODone  Xrays Dg Chest 2 View  Result Date: 08/05/2018 CLINICAL DATA:  Status post aortic  valve replacement EXAM: CHEST - 2 VIEW COMPARISON:  08/03/2018 FINDINGS: Left jugular sheath has been removed in the interval. Cardiac shadow is mildly enlarged but stable. Postsurgical changes are again seen. The right lung is clear. Persistent left basilar infiltrate and effusion are seen and stable in appearance given some change in patient position. No acute bony abnormality is noted. IMPRESSION: Left lower lobe infiltrate with associated effusion stable from the previous exam. Electronically Signed   By: Inez Catalina M.D.   On: 08/05/2018 08:22    Assessment/Plan: S/P Procedure(s) (LRB): AORTIC VALVE REPLACEMENT (AVR) using Inspiris Resilia  size 23 Aortic Valve. (N/A) TRANSESOPHAGEAL ECHOCARDIOGRAM (TEE) (N/A)  1  Currently in  HD, nephrology managing renal issues 2 Conts to have some HTN but most recent readings show improving trend. Sinus rhythm with SVPB's on tele 3 CXR appears stable in regards to pleural effusion/ left lower infiltrate 4 deconditioning - conts to work with PT, recs SNF at discharge. Did ambulate 3oo ft yesterday evening 5 H/H remains stable 6 BS adeq controlled, preop A1C is 4.2   LOS: 8 days    John Giovanni PA-C 08/05/2018 Pager [phone]

## 2018-08-05 NOTE — Progress Notes (Signed)
Physical Therapy Treatment Patient Details Name: Jeffrey [name] MRN: [id number] DOB: 06/19/1956 Today's Date: 08/05/2018    History of Present Illness 62 y.o. male admitted on 07/09/18 for seizure with AMS/increased somnolence and missed HD, CXR showed plural effusion, and CT was negative.  Pt also with anemia/thrombocytopenia, hypertensive urgency.  Pt with seizure during HD on 07/10/18 and code blue called due to loss of pulse.  Pt was intubated and transferred to ICU (intubated 10/20-10/24/19).  Pt with other significant PMH of HIV, Hep B, ESRD on HD (at home), aortic valve stenosis, I and D of L knee, multiple AVF placements/revisions.    PT Comments    Patient refusing ambulation secondary to fatigue from dialysis, stating, "have you ever done dialysis before?" Agreeable to transfer from bed to chair. Patient still requiring significant amount of assist for bed mobility and transfers. Maximal assistance provided for progressing to edge of bed and pt demonstrating poor balance, with loss of balance posteriorly and then requiring up to two person moderate assistance for transfers. Decreased compliance of sternal precautions despite reinforcement. Continue to recommend SNF for ongoing Physical Therapy.       Follow Up Recommendations  SNF;Supervision for mobility/OOB     Equipment Recommendations  None recommended by PT    Recommendations for Other Services       Precautions / Restrictions Precautions Precautions: Fall Precaution Comments: mildly unsteady on his feet. Restrictions Weight Bearing Restrictions: Yes(sternal precautions)    Mobility  Bed Mobility Overal bed mobility: Needs Assistance Bed Mobility: Rolling;Sidelying to Sit Rolling: Mod assist Sidelying to sit: Max assist       General bed mobility comments: Pt requiring modA for rolling towards left and then maxA for elevating trunk and bringing BLE's off edge of bed. Use of bed pad to bring hips forward (pt with  no attempt to scoot forward when asked)  Transfers Overall transfer level: Needs assistance Equipment used: Rolling walker (2 wheeled);None Transfers: Sit to/from [facility] Sit to Stand: Mod assist;+2 physical assistance(pt requesting brother help assist) Stand pivot transfers: Min assist       General transfer comment: ModA + 2 for lift assist. MinA for stand pivot from bed to chair  Ambulation/Gait             General Gait Details: Deferred by patient   Stairs             Wheelchair Mobility    Modified Rankin (Stroke Patients Only)       Balance Overall balance assessment: Needs assistance Sitting-balance support: Feet supported;No upper extremity supported Sitting balance-Leahy Scale: Good     Standing balance support: No upper extremity supported;During functional activity Standing balance-Leahy Scale: Fair                              Cognition Arousal/Alertness: Awake/alert Behavior During Therapy: WFL for tasks assessed/performed Overall Cognitive Status: Impaired/Different from baseline Area of Impairment: Memory;Problem solving                     Memory: Decreased short-term memory       Problem Solving: Slow processing        Exercises      General Comments        Pertinent Vitals/Pain      Home Living  Prior Function            PT Goals (current goals can now be found in the care plan section) Acute Rehab PT Goals Patient Stated Goal: to go home today PT Goal Formulation: With patient Time For Goal Achievement: 08/01/18 Potential to Achieve Goals: Good Progress towards PT goals: Progressing toward goals    Frequency    Min 3X/week      PT Plan Current plan remains appropriate    Co-evaluation              AM-PAC PT "6 Clicks" Daily Activity  Outcome Measure  Difficulty turning over in bed (including adjusting bedclothes, sheets and  blankets)?: Unable Difficulty moving from lying on back to sitting on the side of the bed? : Unable Difficulty sitting down on and standing up from a chair with arms (e.g., wheelchair, bedside commode, etc,.)?: Unable Help needed moving to and from a bed to chair (including a wheelchair)?: A Lot Help needed walking in hospital room?: A Lot Help needed climbing 3-5 steps with a railing? : A Lot 6 Click Score: 9    End of Session Equipment Utilized During Treatment: Gait belt Activity Tolerance: Patient tolerated treatment well Patient left: in chair Nurse Communication: Mobility status PT Visit Diagnosis: Muscle weakness (generalized) (M62.81);Difficulty in walking, not elsewhere classified (R26.2)     Time: 6270-3500 PT Time Calculation (min) (ACUTE ONLY): 23 min  Charges:  $Therapeutic Activity: 23-37 mins           Jeffrey [name], [place], DPT Acute Rehabilitation Services Pager [phone] Office [phone]    Jeffrey [name] 08/05/2018, 4:24 PM

## 2018-08-05 NOTE — Procedures (Signed)
I was present at this dialysis session. I have reviewed the session itself and made appropriate changes.   Vital signs in last 24 hours:  Temp:  [97.6 F (36.4 C)-98.4 F (36.9 C)] 97.6 F (36.4 C) (11/15 0600) Pulse Rate:  [63-116] 63 (11/15 0700) Resp:  [13-25] 15 (11/15 0700) BP: (137-165)/(68-140) 151/84 (11/15 0600) SpO2:  [92 %-100 %] 100 % (11/15 0700) Weight:  [72.7 kg] 72.7 kg (11/15 0600) Weight change: 4.7 kg Filed Weights   08/03/18 1132 08/04/18 0500 08/05/18 0600  Weight: 66.4 kg 73.5 kg 72.7 kg    Recent Labs  Lab 08/05/18 0234  NA 133*  K 4.3  CL 96*  CO2 27  GLUCOSE 112*  BUN 34*  CREATININE 7.26*  CALCIUM 8.8*  PHOS 3.0    Recent Labs  Lab 08/03/18 0423 08/04/18 0345 08/05/18 0234  WBC 6.5 7.3 8.8  HGB 8.0* 8.7* 9.0*  HCT 26.8* 28.7* 29.0*  MCV 97.8 96.6 95.4  PLT 172 200 214    Scheduled Meds: . amiodarone  200 mg Oral BID  . amLODipine  10 mg Oral QHS  . aspirin EC  325 mg Oral Daily   Or  . aspirin  324 mg Per Tube Daily  . bictegravir-emtricitabine-tenofovir AF  1 tablet Oral Daily  . bisacodyl  10 mg Oral Daily   Or  . bisacodyl  10 mg Rectal Daily  . calcium acetate  2,001 mg Oral TID WC  . carvedilol  3.125 mg Oral BID WC  . cloNIDine  0.3 mg Oral TID  . [START ON 08/08/2018] darbepoetin (ARANESP) injection - DIALYSIS  200 mcg Intravenous Q Mon-HD  . enoxaparin (LOVENOX) injection  30 mg Subcutaneous Daily  . feeding supplement (PRO-STAT SUGAR FREE 64)  30 mL Oral BID  . hydrALAZINE  100 mg Oral TID  . isosorbide mononitrate  20 mg Oral BID  . lacosamide  150 mg Oral BID  . multivitamin  1 tablet Oral Daily  . pantoprazole  40 mg Oral Daily  . sodium chloride flush  3 mL Intravenous Q12H   Continuous Infusions: . sodium chloride    . sodium chloride    . sodium chloride    . sodium chloride    . sodium chloride     PRN Meds:.sodium chloride, sodium chloride, sodium chloride, sodium chloride, sodium chloride, albuterol,  ALPRAZolam, hydrALAZINE, lidocaine (PF), lidocaine-prilocaine, metoprolol tartrate, ondansetron (ZOFRAN) IV, oxyCODONE, pentafluoroprop-tetrafluoroeth, sodium chloride flush, traMADol, traZODone   Donetta Potts,  MD 08/05/2018, 9:15 AM

## 2018-08-05 NOTE — Progress Notes (Signed)
08/05/2018 1530 Assumed care of pt, received report from [facility].  Pt without C/O at this time.  Sitting up in chair, brother in room with pt.  Call bell in reach. [place]

## 2018-08-05 NOTE — Progress Notes (Signed)
CSW spoke to patient's brother at bedside this morning. Patient was in HD. CSW and brother discussed disposition plan. Patient lives with brother and usually receives HD at home and goes to [facility] in [place] once per month. Brother agrees that patient needs rehab in order to safely return home. Patient would need to receive HD at an outpatient center in order to go to rehab at a SNF.   Per brother, patient has been dismissed from [facility] centers in the [place] area and can only go to [facility].    CSW referred patient to SNFs in [place], and also in [place] and [place]; will have to coordinate where patient will be able to get dialysis while also arranging SNF. [place] referred CSW to [facility] to request the transfer to an outpatient center. [facility] the closest [facility] to [place].   CSW to follow up with [facility] and support with disposition planning, including HD transfer and SNF placement.  Estanislado Emms, [place]

## 2018-08-05 NOTE — Progress Notes (Signed)
Patient arrived 4E from [facility] via wheelchair. Patient unstable getting from chair to bed. Very weak. Patient was oriented to room. CCMD notified. Call bell in reach.

## 2018-08-05 NOTE — Progress Notes (Signed)
Pt tolerated ambulating 345ft

## 2018-08-05 NOTE — Progress Notes (Signed)
PT Cancellation Note  Patient Details Name: Jeffrey [name] MRN: [id number] DOB: Jun 19, 1956   Cancelled Treatment:    Reason Eval/Treat Not Completed: Patient declined, no reason specified. Pt refusing out of bed mobility despite max encouragement, stating, "I just came back from dialysis." Will follow.  Ellamae Sia, PT, DPT Acute Rehabilitation Services Pager [phone] Office [phone]    [name] 08/05/2018, 2:16 PM

## 2018-08-06 MED ORDER — ALBUTEROL SULFATE (2.5 MG/3ML) 0.083% IN NEBU
2.5000 mg | INHALATION_SOLUTION | Freq: Four times a day (QID) | RESPIRATORY_TRACT | Status: DC | PRN
  Administered 2018-08-06 – 2018-08-19 (×4): 2.5 mg via RESPIRATORY_TRACT
  Filled 2018-08-06 (×4): qty 3

## 2018-08-06 NOTE — Progress Notes (Signed)
[id number] Cardiac Rehab Came to ambulate.pt. He was sleeping soundly but aroused easily. Pt declines ambulation complaining that he has to have rest and that he is being woke up at 1am, 4am and 5am. I attempted to explain that he is going to have to get OOB and ambulate to get stronger so that he can get home. He states that he is going to have to get sleep and rest to get better. He finally agreed to be our last pt of the day to ambulate. We will return.

## 2018-08-06 NOTE — Progress Notes (Addendum)
      [address]       [place] [address]             [phone]      9 Days Post-Op Procedure(s) (LRB): AORTIC VALVE REPLACEMENT (AVR) using Inspiris Resilia  size 23 Aortic Valve. (N/A) TRANSESOPHAGEAL ECHOCARDIOGRAM (TEE) (N/A) Subjective: He did not want to work with PT yesterday due to fatigue but he seems open to it today after a nap.   Objective: Vital signs in last 24 hours: Temp:  [98 F (36.7 C)-99.8 F (37.7 C)] 98 F (36.7 C) (11/16 0625) Pulse Rate:  [25-120] 60 (11/16 0625) Cardiac Rhythm: Sinus bradycardia;Heart block (11/16 0701) Resp:  [15-24] 21 (11/16 0625) BP: (131-150)/(68-84) 131/74 (11/16 0625) SpO2:  [98 %-100 %] 100 % (11/16 0625) Weight:  [71.4 kg-71.7 kg] 71.7 kg (11/16 0625)     Intake/Output from previous day: 11/15 0701 - 11/16 0700 In: 360 [P.O.:360] Out: 1501  Intake/Output this shift: No intake/output data recorded.  General appearance: alert, cooperative and no distress Heart: regular rate and rhythm, S1, S2 normal, no murmur, click, rub or gallop Lungs: clear to auscultation bilaterally Abdomen: soft, non-tender; bowel sounds normal; no masses,  no organomegaly Extremities: extremities normal, atraumatic, no cyanosis or edema Wound: clean and dry  Lab Results: Recent Labs    08/04/18 0345 08/05/18 0234  WBC 7.3 8.8  HGB 8.7* 9.0*  HCT 28.7* 29.0*  PLT 200 214   BMET:  Recent Labs    08/04/18 0345 08/05/18 0234  NA 137 133*  K 3.1* 4.3  CL 104 96*  CO2 24 27  GLUCOSE 91 112*  BUN 20 34*  CREATININE 4.49* 7.26*  CALCIUM 7.1* 8.8*    PT/INR: No results for input(s): LABPROT, INR in the last 72 hours. ABG    Component Value Date/Time   PHART 7.333 (L) 07/29/2018 0439   HCO3 20.5 07/29/2018 0439   TCO2 21 (L) 07/29/2018 1609   ACIDBASEDEF 5.0 (H) 07/29/2018 0439   O2SAT 92.0 07/29/2018 0439   CBG (last 3)  No results for input(s): GLUCAP in the last 72 hours.  Assessment/Plan: S/P  Procedure(s) (LRB): AORTIC VALVE REPLACEMENT (AVR) using Inspiris Resilia  size 23 Aortic Valve. (N/A) TRANSESOPHAGEAL ECHOCARDIOGRAM (TEE) (N/A)  1. CV-sinus brady in the 50s to NSR in the 60s. Holding Coreg this morning. Keep wires for now.  2. Pulm-using his incentive spirometer. On room air without issue. He is requesting restarting his albuterol inhaler. 3. Renal-HD per nephrology 4. H and H 9.0/29.0, 214k platelets. Expected acute blood loss anemia 5. Endo-blood glucose well controlled. preop A1C 4.2 6. Debility-did not want to work with PT yesterday due to fatigue from dialysis. I encouraged ambulation today. He does walk around his room and can stand on his own but needs lots of assistance. SNF is recommended on discharge.   Plan: Keep wires one more day. Albuterol for SOB. Ambulate around the room and in the halls with assistance. Encouraged incentive spirometer use.    LOS: 9 days    Elgie Collard 08/06/2018  Patient prefers to to walk much. I have seen and examined [name] and agree with the above assessment  and plan.  Grace Isaac MD [facility] [phone] Office [phone] 08/06/2018 12:38 PM

## 2018-08-06 NOTE — Progress Notes (Signed)
S: Feels well but tired this morning. O:BP 115/73   Pulse (!) 58   Temp 98 F (36.7 C) (Oral)   Resp 17   Ht 6' (1.829 m)   Wt 71.7 kg   SpO2 100%   BMI 21.44 kg/m   Intake/Output Summary (Last 24 hours) at 08/06/2018 1246 Last data filed at 08/06/2018 0047 Gross per 24 hour  Intake 0 ml  Output -  Net 0 ml   Intake/Output: I/O last 3 completed shifts: In: 360 [P.O.:360] Out: 1501 [Other:1501]  Intake/Output this shift:  No intake/output data recorded. Weight change: 0 kg Gen: NAD CVS: bradycardic Resp: cta Abd: benign Ext: no edema, LVF +T/B  Recent Labs  Lab 07/30/18 1941 07/31/18 0359 08/01/18 0605 08/02/18 0509 08/03/18 0423 08/04/18 0345 08/05/18 0234  NA 136 137 136 135 133* 137 133*  K 3.5 3.9 3.7 3.5 4.0 3.1* 4.3  CL 98 100 98 99 97* 104 96*  CO2 27 26 26 26 25 24 27   GLUCOSE 87 85 112* 125* 98 91 112*  BUN 28* 33* 49* 28* 39* 20 34*  CREATININE 5.76* 6.92* 9.07* 5.99* 7.66* 4.49* 7.26*  ALBUMIN 2.6* 2.5* 2.5*  --   --   --  2.5*  CALCIUM 7.8* 8.1* 8.1* 7.8* 8.6* 7.1* 8.8*  PHOS 3.9 5.0* 5.8*  --   --   --  3.0   Liver Function Tests: Recent Labs  Lab 07/31/18 0359 08/01/18 0605 08/05/18 0234  ALBUMIN 2.5* 2.5* 2.5*   No results for input(s): LIPASE, AMYLASE in the last 168 hours. No results for input(s): AMMONIA in the last 168 hours. CBC: Recent Labs  Lab 08/01/18 0605 08/02/18 0509 08/03/18 0423 08/04/18 0345 08/05/18 0234  WBC 6.8 5.8 6.5 7.3 8.8  HGB 7.9* 7.7* 8.0* 8.7* 9.0*  HCT 25.7* 25.5* 26.8* 28.7* 29.0*  MCV 96.6 98.1 97.8 96.6 95.4  PLT 117* 133* 172 200 214   Cardiac Enzymes: No results for input(s): CKTOTAL, CKMB, CKMBINDEX, TROPONINI in the last 168 hours. CBG: Recent Labs  Lab 07/30/18 1845  GLUCAP 80    Iron Studies: No results for input(s): IRON, TIBC, TRANSFERRIN, FERRITIN in the last 72 hours. Studies/Results: Dg Chest 2 View  Result Date: 08/05/2018 CLINICAL DATA:  Status post aortic valve  replacement EXAM: CHEST - 2 VIEW COMPARISON:  08/03/2018 FINDINGS: Left jugular sheath has been removed in the interval. Cardiac shadow is mildly enlarged but stable. Postsurgical changes are again seen. The right lung is clear. Persistent left basilar infiltrate and effusion are seen and stable in appearance given some change in patient position. No acute bony abnormality is noted. IMPRESSION: Left lower lobe infiltrate with associated effusion stable from the previous exam. Electronically Signed   By: Inez Catalina M.D.   On: 08/05/2018 08:22   . amiodarone  200 mg Oral BID  . amLODipine  10 mg Oral QHS  . aspirin EC  325 mg Oral Daily   Or  . aspirin  324 mg Per Tube Daily  . bictegravir-emtricitabine-tenofovir AF  1 tablet Oral Daily  . bisacodyl  10 mg Oral Daily   Or  . bisacodyl  10 mg Rectal Daily  . calcium acetate  2,001 mg Oral TID WC  . carvedilol  3.125 mg Oral BID WC  . cloNIDine  0.3 mg Oral TID  . [START ON 08/08/2018] darbepoetin (ARANESP) injection - DIALYSIS  200 mcg Intravenous Q Mon-HD  . enoxaparin (LOVENOX) injection  30 mg Subcutaneous Daily  .  feeding supplement (PRO-STAT SUGAR FREE 64)  30 mL Oral BID  . hydrALAZINE  100 mg Oral TID  . isosorbide mononitrate  20 mg Oral BID  . lacosamide  150 mg Oral BID  . multivitamin  1 tablet Oral Daily  . pantoprazole  40 mg Oral Daily  . sodium chloride flush  3 mL Intravenous Q12H    BMET    Component Value Date/Time   NA 133 (L) 08/05/2018 0234   NA 136 11/18/2016 1036   K 4.3 08/05/2018 0234   K 3.3 (L) 11/18/2016 1036   CL 96 (L) 08/05/2018 0234   CO2 27 08/05/2018 0234   CO2 29 11/18/2016 1036   GLUCOSE 112 (H) 08/05/2018 0234   GLUCOSE 95 11/18/2016 1036   BUN 34 (H) 08/05/2018 0234   BUN 25.7 11/18/2016 1036   CREATININE 7.26 (H) 08/05/2018 0234   CREATININE 10.64 (H) 11/01/2017 1008   CREATININE 6.6 (HH) 11/18/2016 1036   CALCIUM 8.8 (L) 08/05/2018 0234   CALCIUM 9.5 11/18/2016 1036   GFRNONAA 7 (L)  08/05/2018 0234   GFRNONAA 3 (L) 02/10/2017 1619   GFRAA 8 (L) 08/05/2018 0234   GFRAA <4 (L) 02/10/2017 1619   CBC    Component Value Date/Time   WBC 8.8 08/05/2018 0234   RBC 3.04 (L) 08/05/2018 0234   HGB 9.0 (L) 08/05/2018 0234   HGB 9.7 (L) 05/09/2018 0937   HGB 10.0 (L) 11/18/2016 1036   HCT 29.0 (L) 08/05/2018 0234   HCT 29.8 (L) 05/09/2018 0937   HCT 29.9 (L) 11/18/2016 1036   PLT 214 08/05/2018 0234   PLT 180 05/09/2018 0937   MCV 95.4 08/05/2018 0234   MCV 91 05/09/2018 0937   MCV 95.6 11/18/2016 1036   MCH 29.6 08/05/2018 0234   MCHC 31.0 08/05/2018 0234   RDW 15.9 (H) 08/05/2018 0234   RDW 16.9 (H) 05/09/2018 0937   RDW 18.1 (H) 11/18/2016 1036   LYMPHSABS 0.8 07/15/2018 0558   LYMPHSABS 1.2 11/18/2016 1036   MONOABS 0.4 07/15/2018 0558   MONOABS 0.6 11/18/2016 1036   EOSABS 0.9 (H) 07/15/2018 0558   EOSABS 0.3 11/18/2016 1036   BASOSABS 0.0 07/15/2018 0558   BASOSABS 0.1 11/18/2016 1036    Dialysis Orders:Home Dialysis patient 4 days weekly Getting records from [facility] 70.2 kg   Assessment/ Plan:  1. Severe AI S/P AVR 07/28/18-per CT surgery 2. AFib-post op AVR. Now on amiodarone per Dr. Servando Snare. 3. Seizure disorder-Vimpat resumed 4. HIV-continue meds per primary 5. ESRD - Home HD 4 days a week. K+ 5.5. Use 2.0 K bath, no heparin.Use MWF schedule. 6. Hypertension/volume -post weights are quite variable with swings from 66.4kg to 73.5kg.  Cannot trust bed scales so will have HD in HD unit and stand for pre and post weights to get a more accurate measurement.  Will cont to UF as BP tolerates (he can go up for HD tomorrow). 7. Anemia - HGB 7.4 on adm.8.7today. No transfusion post op. GivenAranesp 200 mcg IV 07/30/18. Follow HGB and transfuse if HGB 7.0 or less. 8. Metabolic bone disease - Ca 8.4 C Ca 9.4 Phos5.0. Resume binders. Getting orders from HD clinic for VDRA. 9. Nutrition - Albumin 2.7 Change diet to  renal/carb mod diet with prostat, renal vits.  10. DM-Per primary.   Donetta Potts, MD [employer] [phone]

## 2018-08-06 NOTE — Progress Notes (Addendum)
At this time, this RN was Informed by CCMD that patient had 7.39 pause at 1635 and 9.25 at 1654 (11/15).

## 2018-08-06 NOTE — Plan of Care (Signed)
  Problem: Education: Goal: Knowledge of General Education information will improve Description Including pain rating scale, medication(s)/side effects and non-pharmacologic comfort measures Outcome: Progressing   Problem: Clinical Measurements: Goal: Ability to maintain clinical measurements within normal limits will improve Outcome: Progressing   Problem: Nutrition: Goal: Adequate nutrition will be maintained Outcome: Progressing   Problem: Safety: Goal: Ability to remain free from injury will improve Outcome: Progressing   Problem: Skin Integrity: Goal: Risk for impaired skin integrity will decrease Outcome: Progressing   Problem: Clinical Measurements: Goal: Cardiovascular complication will be avoided Outcome: Not Progressing   Problem: Activity: Goal: Risk for activity intolerance will decrease Outcome: Not Progressing   Problem: Elimination: Goal: Will not experience complications related to bowel motility Outcome: Not Progressing   Problem: Elimination: Goal: Will not experience complications related to urinary retention Outcome: Not Applicable

## 2018-08-06 NOTE — Progress Notes (Signed)
CARDIAC REHAB PHASE I   PRE:  Rate/Rhythm: 59 Sb  BP:  Supine: 148/70  Sitting:   Standing:    SaO2: 100 RA  MODE:  Ambulation: 200 ft   POST:  Rate/Rhythm: 107 ST  BP:  Supine:   Sitting: 150/80  Standing:    SaO2: 100 RA 1500-1535 Assisted X 2 used rollator and gait belt to ambulate. Pt had difficulty controlling his speed using rollator. As he tired his speed increased and we were unable to get pt to take a sitting rest stop. By the end of the walk pt was exhausted and he flopped barely on the side of the bed. Pt c/o of tiredness and being SOB at the end of the walk. He refused to get in recliner and placed pt back in bed with call light in reach.   Rodney Langton RN 08/06/2018 3:30 PM

## 2018-08-07 MED ORDER — BISACODYL 10 MG RE SUPP: 10.0000 mg | Freq: Every day | RECTAL | Status: DC | PRN

## 2018-08-07 MED ORDER — BISACODYL 5 MG PO TBEC: 10.0000 mg | DELAYED_RELEASE_TABLET | Freq: Every day | ORAL | Status: DC | PRN

## 2018-08-07 NOTE — Progress Notes (Signed)
S: Upset due to diarrhea this morning.  "I told her that I can't take laxatives every day". O:BP (!) 149/63 (BP Location: Right Arm)   Pulse 62   Temp 98 F (36.7 C) (Oral)   Resp 17   Ht 6' (1.829 m)   Wt 72.5 kg   SpO2 100%   BMI 21.68 kg/m   Intake/Output Summary (Last 24 hours) at 08/07/2018 1056 Last data filed at 08/07/2018 0840 Gross per 24 hour  Intake 798 ml  Output -  Net 798 ml   Intake/Output: I/O last 3 completed shifts: In: 838 [P.O.:838] Out: -   Intake/Output this shift:  Total I/O In: 60 [P.O.:60] Out: -  Weight change: -0.2 kg Gen: NAD CVS: no rub Resp: cta Abd: benign Ext: lavf +T/B, no edema  Recent Labs  Lab 08/01/18 0605 08/02/18 0509 08/03/18 0423 08/04/18 0345 08/05/18 0234  NA 136 135 133* 137 133*  K 3.7 3.5 4.0 3.1* 4.3  CL 98 99 97* 104 96*  CO2 26 26 25 24 27   GLUCOSE 112* 125* 98 91 112*  BUN 49* 28* 39* 20 34*  CREATININE 9.07* 5.99* 7.66* 4.49* 7.26*  ALBUMIN 2.5*  --   --   --  2.5*  CALCIUM 8.1* 7.8* 8.6* 7.1* 8.8*  PHOS 5.8*  --   --   --  3.0   Liver Function Tests: Recent Labs  Lab 08/01/18 0605 08/05/18 0234  ALBUMIN 2.5* 2.5*   No results for input(s): LIPASE, AMYLASE in the last 168 hours. No results for input(s): AMMONIA in the last 168 hours. CBC: Recent Labs  Lab 08/01/18 0605 08/02/18 0509 08/03/18 0423 08/04/18 0345 08/05/18 0234  WBC 6.8 5.8 6.5 7.3 8.8  HGB 7.9* 7.7* 8.0* 8.7* 9.0*  HCT 25.7* 25.5* 26.8* 28.7* 29.0*  MCV 96.6 98.1 97.8 96.6 95.4  PLT 117* 133* 172 200 214   Cardiac Enzymes: No results for input(s): CKTOTAL, CKMB, CKMBINDEX, TROPONINI in the last 168 hours. CBG: No results for input(s): GLUCAP in the last 168 hours.  Iron Studies: No results for input(s): IRON, TIBC, TRANSFERRIN, FERRITIN in the last 72 hours. Studies/Results: No results found. Marland Kitchen amiodarone  200 mg Oral BID  . amLODipine  10 mg Oral QHS  . aspirin EC  325 mg Oral Daily   Or  . aspirin  324 mg Per Tube  Daily  . bictegravir-emtricitabine-tenofovir AF  1 tablet Oral Daily  . bisacodyl  10 mg Oral Daily   Or  . bisacodyl  10 mg Rectal Daily  . calcium acetate  2,001 mg Oral TID WC  . carvedilol  3.125 mg Oral BID WC  . cloNIDine  0.3 mg Oral TID  . [START ON 08/08/2018] darbepoetin (ARANESP) injection - DIALYSIS  200 mcg Intravenous Q Mon-HD  . enoxaparin (LOVENOX) injection  30 mg Subcutaneous Daily  . feeding supplement (PRO-STAT SUGAR FREE 64)  30 mL Oral BID  . hydrALAZINE  100 mg Oral TID  . isosorbide mononitrate  20 mg Oral BID  . lacosamide  150 mg Oral BID  . multivitamin  1 tablet Oral Daily  . pantoprazole  40 mg Oral Daily  . sodium chloride flush  3 mL Intravenous Q12H    BMET    Component Value Date/Time   NA 133 (L) 08/05/2018 0234   NA 136 11/18/2016 1036   K 4.3 08/05/2018 0234   K 3.3 (L) 11/18/2016 1036   CL 96 (L) 08/05/2018 0234   CO2  27 08/05/2018 0234   CO2 29 11/18/2016 1036   GLUCOSE 112 (H) 08/05/2018 0234   GLUCOSE 95 11/18/2016 1036   BUN 34 (H) 08/05/2018 0234   BUN 25.7 11/18/2016 1036   CREATININE 7.26 (H) 08/05/2018 0234   CREATININE 10.64 (H) 11/01/2017 1008   CREATININE 6.6 (HH) 11/18/2016 1036   CALCIUM 8.8 (L) 08/05/2018 0234   CALCIUM 9.5 11/18/2016 1036   GFRNONAA 7 (L) 08/05/2018 0234   GFRNONAA 3 (L) 02/10/2017 1619   GFRAA 8 (L) 08/05/2018 0234   GFRAA <4 (L) 02/10/2017 1619   CBC    Component Value Date/Time   WBC 8.8 08/05/2018 0234   RBC 3.04 (L) 08/05/2018 0234   HGB 9.0 (L) 08/05/2018 0234   HGB 9.7 (L) 05/09/2018 0937   HGB 10.0 (L) 11/18/2016 1036   HCT 29.0 (L) 08/05/2018 0234   HCT 29.8 (L) 05/09/2018 0937   HCT 29.9 (L) 11/18/2016 1036   PLT 214 08/05/2018 0234   PLT 180 05/09/2018 0937   MCV 95.4 08/05/2018 0234   MCV 91 05/09/2018 0937   MCV 95.6 11/18/2016 1036   MCH 29.6 08/05/2018 0234   MCHC 31.0 08/05/2018 0234   RDW 15.9 (H) 08/05/2018 0234   RDW 16.9 (H) 05/09/2018 0937   RDW 18.1 (H) 11/18/2016  1036   LYMPHSABS 0.8 07/15/2018 0558   LYMPHSABS 1.2 11/18/2016 1036   MONOABS 0.4 07/15/2018 0558   MONOABS 0.6 11/18/2016 1036   EOSABS 0.9 (H) 07/15/2018 0558   EOSABS 0.3 11/18/2016 1036   BASOSABS 0.0 07/15/2018 0558   BASOSABS 0.1 11/18/2016 1036   Dialysis Orders:Home Dialysis patient 4 days weekly Getting records from Center 70.2 kg   Assessment/ Plan:  1. Severe AI S/P AVR 07/28/18-per CT surgery 2. AFib-post op AVR. Now on amiodarone per Dr. Servando Snare. 3. Seizure disorder-Vimpat resumed 4. HIV-continue meds per primary 5. ESRD - Home HD 4 days a week. K+ 5.5. Use 2.0 K bath, no heparin.Use MWF schedule. 6. Diarrhea- will change laxatives/stool softeners to prn. 7. Hypertension/volume -post weights are quite variable with swings from 66.4kg to 73.5kg. Cannot trust bed scales so will have HD in HD unit and stand for pre and post weights to get a more accurate measurement. Will cont to UF as BP tolerates  8. Anemia - HGB 7.4 on adm.8.7today. No transfusion post op. GivenAranesp 200 mcg IV 07/30/18. Follow HGB and transfuse if HGB 7.0 or less. 9. Metabolic bone disease - Ca 8.4 C Ca 9.4 Phos5.0. Resume binders. Getting orders from HD clinic for VDRA. 10. Nutrition - Albumin 2.7 Change diet to renal/carb mod diet with prostat, renal vits.  11. DM-Per primary. 12. Deconditioning- cont to work with PT/OT and SNF is recommended on discharge.   Donetta Potts, MD [employer] [phone]

## 2018-08-07 NOTE — Progress Notes (Addendum)
      [address]       [place], [address]             [phone]      10 Days Post-Op Procedure(s) (LRB): AORTIC VALVE REPLACEMENT (AVR) using Inspiris Resilia  size 23 Aortic Valve. (N/A) TRANSESOPHAGEAL ECHOCARDIOGRAM (TEE) (N/A) Subjective: No issues overnight. He does not want to take the stool softeners.  Objective: Vital signs in last 24 hours: Temp:  [98 F (36.7 C)-98.6 F (37 C)] 98.6 F (37 C) (11/17 0314) Pulse Rate:  [56-62] 58 (11/17 0314) Cardiac Rhythm: Normal sinus rhythm;Heart block (11/17 0701) Resp:  [8-25] 19 (11/17 0314) BP: (115-173)/(72-139) 133/85 (11/17 0314) SpO2:  [97 %-100 %] 100 % (11/17 0314) Weight:  [72.5 kg] 72.5 kg (11/17 0314)     Intake/Output from previous day: 11/16 0701 - 11/17 0700 In: 838 [P.O.:838] Out: -  Intake/Output this shift: No intake/output data recorded.  General appearance: alert, cooperative and no distress Heart: regular rate and rhythm, S1, S2 normal, no murmur, click, rub or gallop Lungs: clear to auscultation bilaterally Abdomen: soft, non-tender; bowel sounds normal; no masses,  no organomegaly Extremities: extremities normal, atraumatic, no cyanosis or edema Wound: clean and dry  Lab Results: Recent Labs    08/05/18 0234  WBC 8.8  HGB 9.0*  HCT 29.0*  PLT 214   BMET:  Recent Labs    08/05/18 0234  NA 133*  K 4.3  CL 96*  CO2 27  GLUCOSE 112*  BUN 34*  CREATININE 7.26*  CALCIUM 8.8*    PT/INR: No results for input(s): LABPROT, INR in the last 72 hours. ABG    Component Value Date/Time   PHART 7.333 (L) 07/29/2018 0439   HCO3 20.5 07/29/2018 0439   TCO2 21 (L) 07/29/2018 1609   ACIDBASEDEF 5.0 (H) 07/29/2018 0439   O2SAT 92.0 07/29/2018 0439   CBG (last 3)  No results for input(s): GLUCAP in the last 72 hours.  Assessment/Plan: S/P Procedure(s) (LRB): AORTIC VALVE REPLACEMENT (AVR) using Inspiris Resilia  size 23 Aortic Valve. (N/A) TRANSESOPHAGEAL  ECHOCARDIOGRAM (TEE) (N/A)  1. CV-First degree AVB on tele. NSR in the 60s this morning. Continues to have SB into the 31s. Continue Amio 200mg  BID, Norvasc, hydralazine, clonidine, and coreg (holding parameters in place).  2. Pulm-using his incentive spirometer. On room air without issue. On albuterol inhaler PRN 3. Renal-HD per nephrology 4. H and H 9.0/29.0, 214k platelets. Expected acute blood loss anemia 5. Endo-blood glucose well controlled. preop A1C 4.2 6. Debility-Continue to work with PT. SNF is recommended on discharge.   Plan: Keep wires since he continues to have episodes of brady at times. Hold coreg when appropriate. Continue to use incentive spirometer and ambulate in the halls. HD tomorrow.    LOS: 10 days    Elgie Collard 08/07/2018  Slow progress To got snf  I have seen and examined [name] and agree with the above assessment  and plan.  Grace Isaac MD [employer] [phone] Office [phone] 08/07/2018 12:50 PM

## 2018-08-07 NOTE — Plan of Care (Signed)
  Problem: Education: Goal: Knowledge of General Education information will improve Description Including pain rating scale, medication(s)/side effects and non-pharmacologic comfort measures 08/07/2018 0354 by Maxwell Caul, Student-RN Outcome: Progressing   Problem: Health Behavior/Discharge Planning: Goal: Ability to manage health-related needs will improve 08/07/2018 0354 by Maxwell Caul, Student-RN Outcome: Progressing  Problem: Clinical Measurements: Goal: Ability to maintain clinical measurements within normal limits will improve 08/07/2018 0354 by Maxwell Caul, Student-RN Outcome: Progressing Goal: Will remain free from infection 08/07/2018 0354 by Maxwell Caul, Student-RN Outcome: Progressing Goal: Diagnostic test results will improve Outcome: Progressing Goal: Respiratory complications will improve 08/07/2018 0354 by Maxwell Caul, Student-RN Outcome: Progressing Goal: Cardiovascular complication will be avoided 08/07/2018 0354 by Maxwell Caul, Student-RN Outcome: Progressing   Problem: Activity: Goal: Risk for activity intolerance will decrease 08/07/2018 0354 by Maxwell Caul, Student-RN Outcome: Progressing   Problem: Nutrition: Goal: Adequate nutrition will be maintained 08/07/2018 0354 by Maxwell Caul, Student-RN Outcome: Progressing   Problem: Coping: Goal: Level of anxiety will decrease 08/07/2018 0354 by Maxwell Caul, Student-RN Outcome: Progressing   Problem: Elimination: Goal: Will not experience complications related to bowel motility Outcome: Progressing   Problem: Pain Managment: Goal: General experience of comfort will improve Outcome: Progressing   Problem: Safety: Goal: Ability to remain free from injury will improve Outcome: Progressing   Problem: Skin Integrity: Goal: Risk for impaired skin integrity will decrease Outcome: Progressing   Problem: Education: Goal:  Will demonstrate proper wound care and an understanding of methods to prevent future damage Outcome: Progressing Goal: Knowledge of disease or condition will improve Outcome: Progressing Goal: Knowledge of the prescribed therapeutic regimen will improve Outcome: Progressing   Problem: Activity: Goal: Risk for activity intolerance will decrease Outcome: Progressing   Problem: Cardiac: Goal: Will achieve and/or maintain hemodynamic stability Outcome: Progressing   Problem: Clinical Measurements: Goal: Postoperative complications will be avoided or minimized Outcome: Progressing   Problem: Respiratory: Goal: Respiratory status will improve Outcome: Progressing   Problem: Skin Integrity: Goal: Wound healing without signs and symptoms of infection Outcome: Progressing Goal: Risk for impaired skin integrity will decrease Outcome: Progressing

## 2018-08-08 ENCOUNTER — Ambulatory Visit: Payer: Medicare Other | Admitting: Cardiovascular Disease

## 2018-08-08 ENCOUNTER — Ambulatory Visit: Payer: Medicare Other | Admitting: Physician Assistant

## 2018-08-08 LAB — GLUCOSE, CAPILLARY: GLUCOSE-CAPILLARY: 101 mg/dL — AB (ref 70–99)

## 2018-08-08 LAB — RENAL FUNCTION PANEL
ALBUMIN: 2.4 g/dL — AB (ref 3.5–5.0)
Anion gap: 11 (ref 5–15)
BUN: 40 mg/dL — AB (ref 8–23)
CO2: 27 mmol/L (ref 22–32)
CREATININE: 8.91 mg/dL — AB (ref 0.61–1.24)
Calcium: 8.5 mg/dL — ABNORMAL LOW (ref 8.9–10.3)
Chloride: 97 mmol/L — ABNORMAL LOW (ref 98–111)
GFR calc Af Amer: 6 mL/min — ABNORMAL LOW (ref >60)
GFR calc non Af Amer: 6 mL/min — ABNORMAL LOW (ref >60)
Glucose, Bld: 106 mg/dL — ABNORMAL HIGH (ref 70–99)
PHOSPHORUS: 3.6 mg/dL (ref 2.5–4.6)
Potassium: 3.9 mmol/L (ref 3.5–5.1)
SODIUM: 135 mmol/L (ref 135–145)

## 2018-08-08 LAB — CBC
HCT: 30 % — ABNORMAL LOW (ref 39.0–52.0)
Hemoglobin: 9 g/dL — ABNORMAL LOW (ref 13.0–17.0)
MCH: 28.3 pg (ref 26.0–34.0)
MCHC: 30 g/dL (ref 30.0–36.0)
MCV: 94.3 fL (ref 80.0–100.0)
PLATELETS: 239 10*3/uL (ref 150–400)
RBC: 3.18 MIL/uL — ABNORMAL LOW (ref 4.22–5.81)
RDW: 15.3 % (ref 11.5–15.5)
WBC: 6.3 10*3/uL (ref 4.0–10.5)
nRBC: 0 % (ref 0.0–0.2)

## 2018-08-08 MED ORDER — DARBEPOETIN ALFA 200 MCG/0.4ML IJ SOSY
PREFILLED_SYRINGE | INTRAMUSCULAR | Status: AC
  Filled 2018-08-08: qty 0.4

## 2018-08-08 NOTE — Progress Notes (Signed)
11 Days Post-Op Procedure(s) (LRB): AORTIC VALVE REPLACEMENT (AVR) using Inspiris Resilia  size 23 Aortic Valve. (N/A) TRANSESOPHAGEAL ECHOCARDIOGRAM (TEE) (N/A) Subjective: Was asleep on dialysis No complaints when awakened  Objective: Vital signs in last 24 hours: Temp:  [97.8 F (36.6 C)-98.3 F (36.8 C)] 97.8 F (36.6 C) (11/18 0755) Pulse Rate:  [54-64] 55 (11/18 0819) Cardiac Rhythm: Sinus bradycardia;Bundle branch block (11/18 0800) Resp:  [13-20] 14 (11/18 0819) BP: (117-149)/(63-80) 117/77 (11/18 0819) SpO2:  [100 %] 100 % (11/18 0755) Weight:  [71 kg] 71 kg (11/18 0755)  Hemodynamic parameters for last 24 hours:    Intake/Output from previous day: 11/17 0701 - 11/18 0700 In: 850 [P.O.:850] Out: -  Intake/Output this shift: No intake/output data recorded.  General appearance: alert, cooperative and no distress Neurologic: intact Heart: regular rate and rhythm Wound: clean and dry  Lab Results: Recent Labs    08/08/18 0726  WBC 6.3  HGB 9.0*  HCT 30.0*  PLT 239   BMET:  Recent Labs    08/08/18 0726  NA 135  K 3.9  CL 97*  CO2 27  GLUCOSE 106*  BUN 40*  CREATININE 8.91*  CALCIUM 8.5*    PT/INR: No results for input(s): LABPROT, INR in the last 72 hours. ABG    Component Value Date/Time   PHART 7.333 (L) 07/29/2018 0439   HCO3 20.5 07/29/2018 0439   TCO2 21 (L) 07/29/2018 1609   ACIDBASEDEF 5.0 (H) 07/29/2018 0439   O2SAT 92.0 07/29/2018 0439   CBG (last 3)  Recent Labs    08/08/18 0644  GLUCAP 101*    Assessment/Plan: S/P Procedure(s) (LRB): AORTIC VALVE REPLACEMENT (AVR) using Inspiris Resilia  size 23 Aortic Valve. (N/A) TRANSESOPHAGEAL ECHOCARDIOGRAM (TEE) (N/A) - Continues to be slow to progress  Will dc external pacer Dc pacing wires tomorrow if no issues SNF later this week   LOS: 11 days    [name] 08/08/2018

## 2018-08-08 NOTE — Procedures (Signed)
Patient seen on Hemodialysis-tolerating without problems. QB 400, UF goal 1.3 L.  Left RCF cannulated.  Jeffrey Shiley MD [facility]. Office # [phone] Pager # [phone] 10:27 AM

## 2018-08-08 NOTE — Progress Notes (Signed)
CARDIAC REHAB PHASE I   PRE:  Rate/Rhythm: 64 SR    BP: sitting 107/66    SaO2: 98 RA  MODE:  Ambulation: 130 ft   POST:  Rate/Rhythm: 88 SR    BP: sitting 138/78     SaO2: 99 RA  Pt reluctant to walk initially but responded to encouragement and joking. Brother very supportive. Used gait belt and walked with RW. Assist x2 so to follow with rollator. Pt with small steps, feeling tired and weak. Sat and rested after 65 ft then returned to bed.  [place]aine, ACSM 08/08/2018 3:12 PM

## 2018-08-08 NOTE — Progress Notes (Signed)
Patient ID: Jeffrey [name], male   DOB: 15-Jan-1956, 62 y.o.   MRN: [id number] [facility] Progress Note   Assessment/ Plan:   1. Severe aortic insufficiency status post aortic valve replacement on 07/28/2018 by cardiothoracic surgery.  Plans noted for discontinuation of external pacer and possibly pacing wires tomorrow if no acute issues noted overnight. 2. ESRD: He is usually on home hemodialysis 4 days a week and has been transitioned to a Monday/Wednesday/Friday schedule while here at the hospital. 3. Anemia: Without overt blood loss and stable hemoglobin/hematocrit following surgery.  We will continue to monitor on high-dose Aranesp. 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 5. Nutrition: Continue current diet/nutritional supplementation-patient admits that he gets food from home. 6. Hypertension: Blood pressures fairly controlled, continue to monitor with ultrafiltration/hemodialysis.  Euvolemic on exam.  Subjective:   Reports to be feeling fair with minimal chest pain and no further diarrhea.   Objective:   BP 133/72   Pulse (!) 58   Temp 97.8 F (36.6 C) (Oral)   Resp 16   Ht 6' (1.829 m)   Wt 71 kg   SpO2 100%   BMI 21.23 kg/m   Physical Exam: [id number] comfortably on dialysis CVS: Pulse regular bradycardia, heart sounds S1 and S2 normal Resp: Clear to auscultation, no rales/rhonchi Abd: Soft, flat, nontender Ext: No edema over lower extremities, left RCF cannulated  Labs: BMET Recent Labs  Lab 08/02/18 0509 08/03/18 0423 08/04/18 0345 08/05/18 0234 08/08/18 0726  NA 135 133* 137 133* 135  K 3.5 4.0 3.1* 4.3 3.9  CL 99 97* 104 96* 97*  CO2 26 25 24 27 27   GLUCOSE 125* 98 91 112* 106*  BUN 28* 39* 20 34* 40*  CREATININE 5.99* 7.66* 4.49* 7.26* 8.91*  CALCIUM 7.8* 8.6* 7.1* 8.8* 8.5*  PHOS  --   --   --  3.0 3.6   CBC Recent Labs  Lab 08/03/18 0423 08/04/18 0345 08/05/18 0234  08/08/18 0726  WBC 6.5 7.3 8.8 6.3  HGB 8.0* 8.7* 9.0* 9.0*  HCT 26.8* 28.7* 29.0* 30.0*  MCV 97.8 96.6 95.4 94.3  PLT 172 200 214 239   Medications:    . amiodarone  200 mg Oral BID  . amLODipine  10 mg Oral QHS  . aspirin EC  325 mg Oral Daily   Or  . aspirin  324 mg Per Tube Daily  . bictegravir-emtricitabine-tenofovir AF  1 tablet Oral Daily  . calcium acetate  2,001 mg Oral TID WC  . carvedilol  3.125 mg Oral BID WC  . cloNIDine  0.3 mg Oral TID  . darbepoetin (ARANESP) injection - DIALYSIS  200 mcg Intravenous Q Mon-HD  . enoxaparin (LOVENOX) injection  30 mg Subcutaneous Daily  . feeding supplement (PRO-STAT SUGAR FREE 64)  30 mL Oral BID  . hydrALAZINE  100 mg Oral TID  . isosorbide mononitrate  20 mg Oral BID  . lacosamide  150 mg Oral BID  . multivitamin  1 tablet Oral Daily  . pantoprazole  40 mg Oral Daily  . sodium chloride flush  3 mL Intravenous Q12H   Elmarie Shiley, MD 08/08/2018, 10:21 AM

## 2018-08-09 MED ORDER — AMIODARONE HCL 200 MG PO TABS
200.0000 mg | ORAL_TABLET | Freq: Every day | ORAL | Status: DC
  Administered 2018-08-09 – 2018-08-18 (×9): 200 mg via ORAL
  Filled 2018-08-09 (×10): qty 1

## 2018-08-09 MED ORDER — LIDOCAINE 4 % EX CREA
TOPICAL_CREAM | Freq: Every day | CUTANEOUS | Status: DC | PRN
  Filled 2018-08-09: qty 5

## 2018-08-09 NOTE — Progress Notes (Signed)
Patient ID: Jeffrey [name], male   DOB: 1955/10/31, 62 y.o.   MRN: [id number]  [facility] Progress Note   Assessment/ Plan:   1. Severe aortic insufficiency status post aortic valve replacement on 07/28/2018 by cardiothoracic surgery.  Plans noted for discontinuation of pacing wires today and possible discharge to skilled nursing facility later this week. 2. ESRD: He is usually on home hemodialysis 4 days a week and has been transitioned to a Monday/Wednesday/Friday schedule while here at the hospital.  I have ordered this for tomorrow 3. Anemia: Without overt blood loss and stable hemoglobin/hematocrit following surgery.  We will continue to monitor on high-dose Aranesp. 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 5. Nutrition: Continue current diet/nutritional supplementation-patient admits that he gets food from home. 6. Hypertension: Blood pressures fairly controlled, continue to monitor with ultrafiltration/hemodialysis.  Euvolemic on exam.  Subjective:   Reports to be feeling fair with minimal chest wall discomfort-discouraged by slow progress.   Objective:   BP 127/69 (BP Location: Right Arm)   Pulse (!) 54   Temp 97.8 F (36.6 C) (Oral)   Resp 12   Ht 6' (1.829 m)   Wt 71.8 kg   SpO2 100%   BMI 21.47 kg/m   Physical Exam: [id number] comfortably in bed CVS: Pulse regular bradycardia, heart sounds S1 and S2 normal Resp: Clear to auscultation, no rales/rhonchi Abd: Soft, flat, nontender Ext: No edema over lower extremities, left RCF cannulated  Labs: BMET Recent Labs  Lab 08/03/18 0423 08/04/18 0345 08/05/18 0234 08/08/18 0726  NA 133* 137 133* 135  K 4.0 3.1* 4.3 3.9  CL 97* 104 96* 97*  CO2 25 24 27 27   GLUCOSE 98 91 112* 106*  BUN 39* 20 34* 40*  CREATININE 7.66* 4.49* 7.26* 8.91*  CALCIUM 8.6* 7.1* 8.8* 8.5*  PHOS  --   --  3.0 3.6   CBC Recent Labs  Lab 08/03/18 0423 08/04/18 0345  08/05/18 0234 08/08/18 0726  WBC 6.5 7.3 8.8 6.3  HGB 8.0* 8.7* 9.0* 9.0*  HCT 26.8* 28.7* 29.0* 30.0*  MCV 97.8 96.6 95.4 94.3  PLT 172 200 214 239   Medications:    . amiodarone  200 mg Oral Daily  . amLODipine  10 mg Oral QHS  . aspirin EC  325 mg Oral Daily   Or  . aspirin  324 mg Per Tube Daily  . bictegravir-emtricitabine-tenofovir AF  1 tablet Oral Daily  . calcium acetate  2,001 mg Oral TID WC  . cloNIDine  0.3 mg Oral TID  . darbepoetin (ARANESP) injection - DIALYSIS  200 mcg Intravenous Q Mon-HD  . enoxaparin (LOVENOX) injection  30 mg Subcutaneous Daily  . feeding supplement (PRO-STAT SUGAR FREE 64)  30 mL Oral BID  . hydrALAZINE  100 mg Oral TID  . isosorbide mononitrate  20 mg Oral BID  . lacosamide  150 mg Oral BID  . multivitamin  1 tablet Oral Daily  . pantoprazole  40 mg Oral Daily  . sodium chloride flush  3 mL Intravenous Q12H   Elmarie Shiley, MD 08/09/2018, 10:23 AM

## 2018-08-09 NOTE — Progress Notes (Addendum)
      [address]       [place] [address]             [phone]      12 Days Post-Op Procedure(s) (LRB): AORTIC VALVE REPLACEMENT (AVR) using Inspiris Resilia  size 23 Aortic Valve. (N/A) TRANSESOPHAGEAL ECHOCARDIOGRAM (TEE) (N/A) Subjective: No issues. He is a little discouraged about his progress.   Objective: Vital signs in last 24 hours: Temp:  [97.8 F (36.6 C)-98.4 F (36.9 C)] 97.8 F (36.6 C) (11/19 0400) Pulse Rate:  [54-64] 54 (11/19 0500) Cardiac Rhythm: Sinus bradycardia;Bundle branch block (11/19 0500) Resp:  [12-22] 12 (11/19 0500) BP: (108-133)/(58-83) 127/69 (11/19 0400) SpO2:  [96 %-100 %] 100 % (11/19 0500) Weight:  [70.2 kg-71.8 kg] 71.8 kg (11/19 0410)  Intake/Output from previous day: 11/18 0701 - 11/19 0700 In: 1000 [P.O.:1000] Out: 800  Intake/Output this shift: No intake/output data recorded.  General appearance: alert, cooperative and no distress Heart: regular rate and rhythm, S1, S2 normal, no murmur, click, rub or gallop Lungs: clear to auscultation bilaterally Abdomen: soft, non-tender; bowel sounds normal; no masses,  no organomegaly Extremities: extremities normal, atraumatic, no cyanosis or edema Wound: clean and dry  Lab Results: Recent Labs    08/08/18 0726  WBC 6.3  HGB 9.0*  HCT 30.0*  PLT 239   BMET:  Recent Labs    08/08/18 0726  NA 135  K 3.9  CL 97*  CO2 27  GLUCOSE 106*  BUN 40*  CREATININE 8.91*  CALCIUM 8.5*    PT/INR: No results for input(s): LABPROT, INR in the last 72 hours. ABG    Component Value Date/Time   PHART 7.333 (L) 07/29/2018 0439   HCO3 20.5 07/29/2018 0439   TCO2 21 (L) 07/29/2018 1609   ACIDBASEDEF 5.0 (H) 07/29/2018 0439   O2SAT 92.0 07/29/2018 0439   CBG (last 3)  Recent Labs    08/08/18 0644  GLUCAP 101*    Assessment/Plan: S/P Procedure(s) (LRB): AORTIC VALVE REPLACEMENT (AVR) using Inspiris Resilia  size 23 Aortic Valve. (N/A) TRANSESOPHAGEAL  ECHOCARDIOGRAM (TEE) (N/A)  1. CV-First degree AVB on tele. NSR in the 60s this morning. Continues to have SB into the 51s. Continue Amio 200mg  BID, Norvasc, hydralazine, clonidine, and coreg (holding parameters in place).   2. Pulm-using his incentive spirometer. On room air without issue. On albuterol inhaler PRN 3. Renal-HD per nephrology 4. H and H 9.0/30.0, 239k platelets. Expected acute blood loss anemia 5. Endo-blood glucose well controlled. preop A1C 4.2 6. Debility-Continue to work with PT. SNF is recommended on discharge.   Plan: Wires out today-rhythm has been stable. Continue PT-making slow progress. No laxitives   LOS: 12 days    Elgie Collard 08/09/2018 Patient seen and examined, agree with above Progress has been slow as he has been resistant to being mobilized Plan dc to SNF later this week  Remo Lipps C. Roxan Hockey, MD [facility] [phone]

## 2018-08-09 NOTE — Progress Notes (Signed)
EPW removed per protocol. VSS. Pt educated on one hour bedrest. Call light in reach. Will continue to monitor.  Clyde Canterbury, RN

## 2018-08-09 NOTE — Progress Notes (Signed)
PT Cancellation Note  Patient Details Name: Jeffrey [name] MRN: [id number] DOB: July 31, 1956   Cancelled Treatment:    Reason Eval/Treat Not Completed: Fatigue/lethargy limiting ability to participate(patient just completed mobility with cardiac rehab)   Duncan Dull 08/09/2018, 3:08 PM

## 2018-08-09 NOTE — Progress Notes (Signed)
CARDIAC REHAB PHASE I   PRE:  Rate/Rhythm: 51 SB  BP:  Supine:   Sitting: 128/69  Standing:    SaO2: 100%RA  MODE:  Ambulation: 310 ft   POST:  Rate/Rhythm: 81 SR  BP:  Supine: 127/81  Sitting:   Standing:    SaO2: 99%RA 1400-1450 Pt walked 310 ft on RA with rolling walker, gait belt use, and asst x 1. One asst to follow with rollator so pt could sit when tired. Pt took one sitting rest break at 155 ft. Pt assisted to bed and lunch set up.  Sats good on RA.   Graylon Good, RN BSN  08/09/2018 2:45 PM

## 2018-08-10 ENCOUNTER — Inpatient Hospital Stay (HOSPITAL_COMMUNITY): Payer: Medicare Other

## 2018-08-10 MED ORDER — HEPARIN SODIUM (PORCINE) 1000 UNIT/ML DIALYSIS: 20.0000 [IU]/kg | INTRAMUSCULAR | Status: DC | PRN

## 2018-08-10 MED ORDER — SODIUM CHLORIDE 0.9 % IV SOLN
125.0000 mg | INTRAVENOUS | Status: DC
  Administered 2018-08-10 – 2018-08-12 (×2): 125 mg via INTRAVENOUS
  Filled 2018-08-10 (×4): qty 10

## 2018-08-10 NOTE — Procedures (Signed)
Patient seen on Hemodialysis. QB 400, UF goal 1.5L Some asymptomatic bradycardia into the 40s noted earlier on HD- pacer wires removed yesterday.  Elmarie Shiley MD [facility]. Office # [phone] Pager # [phone] 8:52 AM

## 2018-08-10 NOTE — Progress Notes (Signed)
PT Cancellation Note  Patient Details Name: Jeffrey [name] MRN: [id number] DOB: [birth year]   Cancelled Treatment:    Reason Eval/Treat Not Completed: Patient at procedure or test/unavailable (HD).  Ellamae Sia, PT, DPT Acute Rehabilitation Services Pager [phone] Office [phone]    [name] 08/10/2018, 11:09 AM

## 2018-08-10 NOTE — Clinical Social Work Note (Addendum)
CSW reviewed chart and patient is a dialysis patient with [facility] and he does his treatments at home.  PT recommended SNF and patient in agreement. CSW talked with Freda Munro, dialysis staff person regarding his treatments while in a skilled facility. CSW and Freda Munro visited with patient and his brother at the bedside and patient in agreement with SNF. He and brother were provided with bed offers and patient chose [facility]. Daleen Snook, admissions director with [facility] contacted and can take patient when ready, and can transport him to dialysis.  Per Freda Munro, patient can be set-up with [facility] as a transient and will dialyze at [facility] or [facility]. She will advise CSW when dialysis is set-up. CSW will continue to follow and facilitate discharge to [facility] when medically ready and HD set-up.  Marthann Abshier Givens, MSW, LCSW Licensed Clinical Social Worker [place] [phone]

## 2018-08-10 NOTE — Progress Notes (Addendum)
[address]       [place] [address]             [phone]      13 Days Post-Op Procedure(s) (LRB): AORTIC VALVE REPLACEMENT (AVR) using Inspiris Resilia  size 23 Aortic Valve. (N/A) TRANSESOPHAGEAL ECHOCARDIOGRAM (TEE) (N/A) Subjective: Feels fatigued after hemo. The machine clotted and the patient states that he usually get a little heparin before going for HD.  Objective: Vital signs in last 24 hours: Temp:  [97.9 F (36.6 C)-98.7 F (37.1 C)] 98.7 F (37.1 C) (11/20 0732) Pulse Rate:  [52-67] 61 (11/20 1030) Cardiac Rhythm: Heart block (11/20 0730) Resp:  [15-21] 15 (11/20 0745) BP: (113-152)/(74-86) 145/83 (11/20 1030) SpO2:  [96 %-100 %] 96 % (11/20 0732) Weight:  [73 kg-74.1 kg] 74.1 kg (11/20 0732)     Intake/Output from previous day: 11/19 0701 - 11/20 0700 In: 1248 [P.O.:1248] Out: -  Intake/Output this shift: No intake/output data recorded.  General appearance: alert, cooperative and no distress Heart: regular rate and rhythm, S1, S2 normal, no murmur, click, rub or gallop Lungs: clear to auscultation bilaterally Abdomen: soft, non-tender; bowel sounds normal; no masses,  no organomegaly Extremities: extremities normal, atraumatic, no cyanosis or edema Wound: clean and dry  Lab Results: Recent Labs    08/08/18 0726  WBC 6.3  HGB 9.0*  HCT 30.0*  PLT 239   BMET:  Recent Labs    08/08/18 0726  NA 135  K 3.9  CL 97*  CO2 27  GLUCOSE 106*  BUN 40*  CREATININE 8.91*  CALCIUM 8.5*    PT/INR: No results for input(s): LABPROT, INR in the last 72 hours. ABG    Component Value Date/Time   PHART 7.333 (L) 07/29/2018 0439   HCO3 20.5 07/29/2018 0439   TCO2 21 (L) 07/29/2018 1609   ACIDBASEDEF 5.0 (H) 07/29/2018 0439   O2SAT 92.0 07/29/2018 0439   CBG (last 3)  Recent Labs    08/08/18 0644  GLUCAP 101*    Assessment/Plan: S/P Procedure(s) (LRB): AORTIC VALVE REPLACEMENT (AVR) using Inspiris Resilia  size 23  Aortic Valve. (N/A) TRANSESOPHAGEAL ECHOCARDIOGRAM (TEE) (N/A)  1. CV-First degree AVB on tele. NSR in the 60s this morning. He has some episodes of SB into the 40s but asymptomatic. Holding this morning Amio dose, Continue Norvasc, hydralazine, clonidine, and coreg (holding parameters in place).  2. Pulm-using his incentive spirometer. On room air without issue.On albuterol inhaler PRN 3. Renal-HD per nephrology-went today. I would have to discuss the heparin administration with Dr. Roxan Hockey.  4. H and H 9.0/30.0, 239k platelets. Expected acute blood loss anemia 5. Endo-blood glucose well controlled. preop A1C 4.2 6. Debility-Continue to work with PT.SNF is recommended on discharge.  Plan: Episodes of SB in the 71s. Hold coreg and amio. He is asymptomatic. He is having some shortness of breath today which is new. No CXR in 5 days so I will order for this afternoon. Continue PRN albuterol.  Continue to encourage incentive spirometer and ambulation. Patient is requesting a "small amount" of heparin before HD so it doesn't clot like it did today. I will leave this up to Dr. Posey Pronto and Dr. Roxan Hockey to discuss.    LOS: 13 days    Jeffrey [name] 08/10/2018 Patient seen and examined, agree with above It is fine for him to get heparin with HD CXR no change in chronic left effusion Probably can go to [facility] Friday  Remo Lipps C.  Roxan Hockey, MD [facility] [phone]

## 2018-08-11 ENCOUNTER — Other Ambulatory Visit: Payer: Self-pay

## 2018-08-11 NOTE — Progress Notes (Signed)
PT Cancellation Note  Patient Details Name: Jeffrey [name] MRN: [id number] DOB: [birth year]   Cancelled Treatment:    Reason Eval/Treat Not Completed: Other (comment). Patient refusing to participate in physical therapy services x 2 attempts today despite max encouragement and offer to schedule time. On initial attempt, patient complaining of significant chest pain (RN and MD aware). For second attempt, coordinated pain medications with RN, but patient sleepy and difficult to arouse and refusing therapy again. Will follow.  Ellamae Sia, PT, DPT Acute Rehabilitation Services Pager [phone] Office [phone]    [name] 08/11/2018, 3:28 PM

## 2018-08-11 NOTE — Progress Notes (Signed)
Just before 0600, the pt started complaining of pain in his chest of 10/10. An EKG was done and showed no sign of STEMI. Dilaudid 2 mg tablet was given for pain.  Pt kept insisting on checking his blood pressure which started to rise. The highest BP was a manual pressure of 180/76. Heart rate was normal sinus in the 60s. No shortness of breath.  IV hydralazine 10 mg was already given at 0430.  Dr. Roxan Hockey was paged and ordered to give an extra 10 mg hydralazine IV STAT.  Med was given.  Day shift nurse informed.  Lupita Dawn, RN

## 2018-08-11 NOTE — Progress Notes (Signed)
CARDIAC REHAB PHASE I   Offered to walk with pt, pt declining stating a lot of pain in his chest. Pt encouraged to walk a little bit everyday, even on days he does not feel good. Pt given in-the-tube sheet, and reminded of sternal precautions. Encouraged continued use of IS. Pt does not respond well to being "forced" to doing things, which he is very aware of. Lots of encouragement provided to pt to sit in the chair for meals, walk daily, and continue participating in his recovery. Will continue to follow.  [id number] Rufina Falco, RN BSN 08/11/2018 2:44 PM

## 2018-08-11 NOTE — Progress Notes (Addendum)
      [address]       [place] [address]             [phone]      14 Days Post-Op Procedure(s) (LRB): AORTIC VALVE REPLACEMENT (AVR) using Inspiris Resilia  size 23 Aortic Valve. (N/A) TRANSESOPHAGEAL ECHOCARDIOGRAM (TEE) (N/A) Subjective: He has had chest pain since yesterday. It came on when he was laying in the bed. He says it radiates across his chest. EKG shows sinus rhythm with first degree AVB. Patient is in and out of sleep this morning-likely due to just receiving dilaudid.  Objective: Vital signs in last 24 hours: Temp:  [97.8 F (36.6 C)-98.4 F (36.9 C)] 98.4 F (36.9 C) (11/21 0420) Pulse Rate:  [57-63] 63 (11/21 0620) Cardiac Rhythm: Bundle branch block;Heart block;Sinus bradycardia (11/20 1900) Resp:  [15-21] 20 (11/21 0620) BP: (112-180)/(71-118) 180/76 (11/21 0620) SpO2:  [98 %-100 %] 98 % (11/21 0620) Weight:  [67 kg] 67 kg (11/21 0420)     Intake/Output from previous day: 11/20 0701 - 11/21 0700 In: 355.3 [P.O.:250; IV Piggyback:105.3] Out: -  Intake/Output this shift: No intake/output data recorded.  General appearance: cooperative and no distress Heart: regular rate and rhythm, S1, S2 normal, +systolic murmur Lungs: clear to auscultation bilaterally Abdomen: soft, non-tender; bowel sounds normal; no masses,  no organomegaly Extremities: extremities normal, atraumatic, no cyanosis or edema Wound: clean and dry  Lab Results: No results for input(s): WBC, HGB, HCT, PLT in the last 72 hours. BMET: No results for input(s): NA, K, CL, CO2, GLUCOSE, BUN, CREATININE, CALCIUM in the last 72 hours.  PT/INR: No results for input(s): LABPROT, INR in the last 72 hours. ABG    Component Value Date/Time   PHART 7.333 (L) 07/29/2018 0439   HCO3 20.5 07/29/2018 0439   TCO2 21 (L) 07/29/2018 1609   ACIDBASEDEF 5.0 (H) 07/29/2018 0439   O2SAT 92.0 07/29/2018 0439   CBG (last 3)  No results for input(s): GLUCAP in the last 72  hours.  Assessment/Plan: S/P Procedure(s) (LRB): AORTIC VALVE REPLACEMENT (AVR) using Inspiris Resilia  size 23 Aortic Valve. (N/A) TRANSESOPHAGEAL ECHOCARDIOGRAM (TEE) (N/A)  1. CV-hypertensive this morning-PRN hydralazine 10mg  q 4 hours PRN ordered. Continue current medication regimen: Norvasc 10mg  daily, clonidine 0.3mg  TID, and Hydralazine 100mg  TID. He cannot get an ACEI due to CKD and his Coreg was discontinued due to bradycardia down to the 40s.  2. Pulm-tolerating  room air with good oxygen saturation. Unchanged left pleural effusions with opacified lower lobe. Left pleural effusion-chronic.  3. Renal-HR per nephrology, usually Monday, Wed, Fri 4. H and H stable, no labs for a few days and possibly to SNF tomorrow so will order for tomorrow morning.  5. Endo-blood glucose well controlled  Plan: PRN hydralazine for hypertension. Will re-evaluate after morning medication is given. EKG did not show any ST changes but he states he still has pain across his chest. The patient appears much more comfortable after dilaudid was given. CXR from yesterday was stable with small pleural effusions. Labs ordered for the morning.    LOS: 14 days    Elgie Collard 08/11/2018 Pain c/w incisional, reproduced by pressing around incision. He does not have any significant CAD. Should be able to go to SNF tomorrow after HD  Remo Lipps C. Roxan Hockey, MD [facility] [phone]

## 2018-08-11 NOTE — Progress Notes (Signed)
Patient ID: Jeffrey [name], male   DOB: [birth year], 62 y.o.   MRN: [id number] [facility] Progress Note   Assessment/ Plan:   1. Severe aortic insufficiency status post aortic valve replacement on 07/28/2018 by cardiothoracic surgery.  Plans noted for discontinuation of pacing wires today and possible discharge to skilled nursing facility likely over the weekend. 2. ESRD: He is usually on home hemodialysis 4 days a week and has been transitioned to a Monday/Wednesday/Friday schedule while here at the hospital.  He will get hemodialysis again tomorrow and appears will transiently undergo hemodialysis at [facility] while residing at [place]. 3. Anemia: Without overt blood loss and stable hemoglobin/hematocrit following surgery.  We will continue to monitor on high-dose Aranesp. 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 5. Nutrition: Continue current diet/nutritional supplementation-patient admits that he gets food from home. 6. Hypertension: Blood pressures fairly controlled, continue to monitor with ultrafiltration/hemodialysis.  Euvolemic on exam.  Subjective:   Hypertensive earlier this morning with systolics up in the 321Y requiring a PRN dose of hydralazine.  Heart rate in the 50s-60s.     Objective:   BP (!) 149/70 (BP Location: Right Arm)   Pulse 63   Temp 98.2 F (36.8 C) (Oral)   Resp 20   Ht 6' (1.829 m)   Wt 67 kg   SpO2 98%   BMI 20.03 kg/m   Physical Exam: YQM:[id number] comfortably in bed, brother at bedside CVS: Pulse regular rhythm and normal rate, heart sounds S1 and S2 normal Resp: Clear to auscultation, no rales/rhonchi Abd: Soft, flat, nontender Ext: No edema over lower extremities, left RCF with palpable thrill/bruit.  Labs: BMET Recent Labs  Lab 08/05/18 0234 08/08/18 0726  NA 133* 135  K 4.3 3.9  CL 96* 97*  CO2 27 27  GLUCOSE 112* 106*  BUN 34* 40*  CREATININE  7.26* 8.91*  CALCIUM 8.8* 8.5*  PHOS 3.0 3.6   CBC Recent Labs  Lab 08/05/18 0234 08/08/18 0726  WBC 8.8 6.3  HGB 9.0* 9.0*  HCT 29.0* 30.0*  MCV 95.4 94.3  PLT 214 239   Medications:    . amiodarone  200 mg Oral Daily  . amLODipine  10 mg Oral QHS  . aspirin EC  325 mg Oral Daily   Or  . aspirin  324 mg Per Tube Daily  . bictegravir-emtricitabine-tenofovir AF  1 tablet Oral Daily  . calcium acetate  2,001 mg Oral TID WC  . cloNIDine  0.3 mg Oral TID  . darbepoetin (ARANESP) injection - DIALYSIS  200 mcg Intravenous Q Mon-HD  . enoxaparin (LOVENOX) injection  30 mg Subcutaneous Daily  . feeding supplement (PRO-STAT SUGAR FREE 64)  30 mL Oral BID  . hydrALAZINE  100 mg Oral TID  . isosorbide mononitrate  20 mg Oral BID  . lacosamide  150 mg Oral BID  . multivitamin  1 tablet Oral Daily  . pantoprazole  40 mg Oral Daily  . sodium chloride flush  3 mL Intravenous Q12H   Elmarie Shiley, MD 08/11/2018, 10:16 AM

## 2018-08-12 ENCOUNTER — Telehealth: Payer: Self-pay | Admitting: Internal Medicine

## 2018-08-12 LAB — CBC
HCT: 26.8 % — ABNORMAL LOW (ref 39.0–52.0)
Hemoglobin: 8.1 g/dL — ABNORMAL LOW (ref 13.0–17.0)
MCH: 28.5 pg (ref 26.0–34.0)
MCHC: 30.2 g/dL (ref 30.0–36.0)
MCV: 94.4 fL (ref 80.0–100.0)
PLATELETS: 249 10*3/uL (ref 150–400)
RBC: 2.84 MIL/uL — ABNORMAL LOW (ref 4.22–5.81)
RDW: 14.9 % (ref 11.5–15.5)
WBC: 9.2 10*3/uL (ref 4.0–10.5)
nRBC: 0 % (ref 0.0–0.2)

## 2018-08-12 LAB — BASIC METABOLIC PANEL
ANION GAP: 9 (ref 5–15)
BUN: 32 mg/dL — ABNORMAL HIGH (ref 8–23)
CALCIUM: 8.6 mg/dL — AB (ref 8.9–10.3)
CHLORIDE: 96 mmol/L — AB (ref 98–111)
CO2: 28 mmol/L (ref 22–32)
Creatinine, Ser: 6.8 mg/dL — ABNORMAL HIGH (ref 0.61–1.24)
GFR calc non Af Amer: 8 mL/min — ABNORMAL LOW (ref >60)
GFR, EST AFRICAN AMERICAN: 9 mL/min — AB (ref >60)
GLUCOSE: 98 mg/dL (ref 70–99)
POTASSIUM: 4.6 mmol/L (ref 3.5–5.1)
Sodium: 133 mmol/L — ABNORMAL LOW (ref 135–145)

## 2018-08-12 MED ORDER — PENTAFLUOROPROP-TETRAFLUOROETH EX AERO
INHALATION_SPRAY | CUTANEOUS | Status: AC
  Administered 2018-08-12: 2 via TOPICAL
  Filled 2018-08-12: qty 116

## 2018-08-12 NOTE — Progress Notes (Signed)
Patient arrived from dialysis. He is alert and oriented. VS are stable. He is sitting up in the chair and resting comfortably.

## 2018-08-12 NOTE — Progress Notes (Addendum)
      [address]       [place] [address]             [phone]      15 Days Post-Op Procedure(s) (LRB): AORTIC VALVE REPLACEMENT (AVR) using Inspiris Resilia  size 23 Aortic Valve. (N/A) TRANSESOPHAGEAL ECHOCARDIOGRAM (TEE) (N/A) Subjective: He isn't wanting to go to dialysis this morning. He also does not want to be discharged today because he thinks he will feel bad after dialysis. A little of confusion this morning.  Objective: Vital signs in last 24 hours: Temp:  [97.9 F (36.6 C)-98.2 F (36.8 C)] 98.1 F (36.7 C) (11/22 0620) Pulse Rate:  [60-67] 67 (11/22 0620) Cardiac Rhythm: Normal sinus rhythm (11/22 0620) Resp:  [14-20] 19 (11/22 0620) BP: (126-160)/(70-99) 154/99 (11/22 0620) SpO2:  [94 %-99 %] 98 % (11/22 0620) Weight:  [73 kg] 73 kg (11/22 0620)       Intake/Output from previous day: 11/21 0701 - 11/22 0700 In: 360 [P.O.:360] Out: -  Intake/Output this shift: No intake/output data recorded.  General appearance: alert, cooperative and no distress Heart: regular rate and rhythm, S1, S2 normal, no murmur, click, rub or gallop Lungs: clear to auscultation bilaterally Abdomen: soft, non-tender; bowel sounds normal; no masses,  no organomegaly Extremities: no edema Wound: clean and dry  Lab Results: Recent Labs    08/12/18 0323  WBC 9.2  HGB 8.1*  HCT 26.8*  PLT 249   BMET:  Recent Labs    08/12/18 0323  NA 133*  K 4.6  CL 96*  CO2 28  GLUCOSE 98  BUN 32*  CREATININE 6.80*  CALCIUM 8.6*    PT/INR: No results for input(s): LABPROT, INR in the last 72 hours. ABG    Component Value Date/Time   PHART 7.333 (L) 07/29/2018 0439   HCO3 20.5 07/29/2018 0439   TCO2 21 (L) 07/29/2018 1609   ACIDBASEDEF 5.0 (H) 07/29/2018 0439   O2SAT 92.0 07/29/2018 0439   CBG (last 3)  No results for input(s): GLUCAP in the last 72 hours.  Assessment/Plan: S/P Procedure(s) (LRB): AORTIC VALVE REPLACEMENT (AVR) using Inspiris Resilia   size 23 Aortic Valve. (N/A) TRANSESOPHAGEAL ECHOCARDIOGRAM (TEE) (N/A)  1. CV-hypertensive this morning, but improved-PRN hydralazine 10mg  q 4 hours PRN ordered. Continue current medication regimen: Norvasc 10mg  daily, clonidine 0.3mg  TID, and Hydralazine 100mg  TID. He cannot get an ACEI due to CKD and his Coreg was discontinued due to bradycardia down to the 40s.  2. Pulm-tolerating  room air with good oxygen saturation. Unchanged left pleural effusions with opacified lower lobe. Left pleural effusion-chronic.  3. Renal-HR per nephrology, usually Monday, Wed, Fri. Electrolytes okay 4. H and H stable, 8.1/26.8, platelets 249k 5. Endo-blood glucose well controlled  Plan: BP is better this morning but still high. HD this morning.  The patient wants to go home Monday-will see how he feels after dialysis. Also, will check to see if the facility takes patients over the weekend.    LOS: 15 days    Elgie Collard 08/12/2018 Patient seen and examined, agree with above Unable to go to SNF in [facility] as our Nephrology group will not dialyze as an outpatient Hopefully can find a spot in [place]. If not, will have to stay here until able to go home.  Revonda Standard Roxan Hockey, MD [facility] [phone]

## 2018-08-12 NOTE — Telephone Encounter (Signed)
[name] stated he has already spoken to someone about this.

## 2018-08-12 NOTE — Progress Notes (Signed)
PT Cancellation Note  Patient Details Name: Jeffrey [name] MRN: [id number] DOB: [birth year]   Cancelled Treatment:    Reason Eval/Treat Not Completed: Patient at procedure or test/unavailable (HD).  Ellamae Sia, PT, DPT Acute Rehabilitation Services Pager [phone] Office [phone]    [name] 08/12/2018, 12:59 PM

## 2018-08-12 NOTE — Progress Notes (Addendum)
Patient ID: Jeffrey [name], male   DOB: Jun 25, 1956, 62 y.o.   MRN: [id number] [facility] Progress Note   Assessment/ Plan:   1. Severe aortic insufficiency status post aortic valve replacement on 07/28/2018 by cardiothoracic surgery.  Plans noted for possible discharge to skilled nursing facility likely today or over the weekend. 2. ESRD: He is usually on home hemodialysis 4 days a week and has been transitioned to a Monday/Wednesday/Friday schedule while here at the hospital.  He is planned to be discharged to [facility] skilled nursing facility however, will LIKELY NOT be accepted to a local hemodialysis unit after previously being discharged from our practice/local dialysis unit due to behavioral issues and may need to be placed in a skilled nursing facility in [place] where he can be transported locally to undergo hemodialysis at a center that is managed by his primary nephrologist [facility]). 3. Anemia: Without overt blood loss and stable hemoglobin/hematocrit following surgery.  We will continue to monitor on high-dose Aranesp. 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 5. Nutrition: Continue current diet/nutritional supplementation-patient admits that he gets food from home. 6. Hypertension: Blood pressures elevated, he is close to euvolemic on exam. Developed anaphylactic reaction to lisinopril in the past precluding use and raising some apprehension for ARB use.   Subjective:   Expresses some concerns about "miscommunications" regarding dialysis scheduling and placement.     Objective:   BP (!) 158/81   Pulse 74   Temp 98.4 F (36.9 C) (Oral)   Resp 16   Ht 6' (1.829 m)   Wt 73 kg   SpO2 100%   BMI 21.83 kg/m   Physical Exam: [id number] comfortably in hemodialysis CVS: Pulse regular rhythm and normal rate, heart sounds S1 and S2 normal Resp: Clear to auscultation, no  rales/rhonchi Abd: Soft, flat, nontender Ext: No edema over lower extremities, left RCF cannulated.  Labs: BMET Recent Labs  Lab 08/08/18 0726 08/12/18 0323  NA 135 133*  K 3.9 4.6  CL 97* 96*  CO2 27 28  GLUCOSE 106* 98  BUN 40* 32*  CREATININE 8.91* 6.80*  CALCIUM 8.5* 8.6*  PHOS 3.6  --    CBC Recent Labs  Lab 08/08/18 0726 08/12/18 0323  WBC 6.3 9.2  HGB 9.0* 8.1*  HCT 30.0* 26.8*  MCV 94.3 94.4  PLT 239 249   Medications:    . amiodarone  200 mg Oral Daily  . amLODipine  10 mg Oral QHS  . aspirin EC  325 mg Oral Daily   Or  . aspirin  324 mg Per Tube Daily  . bictegravir-emtricitabine-tenofovir AF  1 tablet Oral Daily  . calcium acetate  2,001 mg Oral TID WC  . cloNIDine  0.3 mg Oral TID  . darbepoetin (ARANESP) injection - DIALYSIS  200 mcg Intravenous Q Mon-HD  . enoxaparin (LOVENOX) injection  30 mg Subcutaneous Daily  . feeding supplement (PRO-STAT SUGAR FREE 64)  30 mL Oral BID  . hydrALAZINE  100 mg Oral TID  . isosorbide mononitrate  20 mg Oral BID  . lacosamide  150 mg Oral BID  . multivitamin  1 tablet Oral Daily  . pantoprazole  40 mg Oral Daily  . sodium chloride flush  3 mL Intravenous Q12H   Elmarie Shiley, MD 08/12/2018, 9:49 AM

## 2018-08-12 NOTE — Telephone Encounter (Signed)
Pls call Jeffrey [name] he is asking has the pt had the shingles shot; pt contact [phone]

## 2018-08-12 NOTE — Clinical Social Work Note (Addendum)
CSW received call from [place], dialysis unit director regarding patient. CSW advised that patient was discharged from [facility] some time ago and they will not accept him back. Patient will need to be set-up with a [facility], maybe in [place].   CSW visited [facility] later today to talk with unit secretary Caryl Pina or Freda Munro regarding clinicals needed to submit to [facility]. CSW advised that both are out today and Verdene Lennert was not in her office. CSW will check back and talk with [place] regarding assisting with clinicals needed to send to [facility].  2:15 pm - Call made to [facility] [phone]) and talked with Barnetta Chapel regarding patient's situation: HD at home, cannot go to a [facility] in [place] and needs HD in a center while in a skilled facility for rehab. CSW informed that they will need to know where patient is going before they can assist with HD placement. CSW also informed that due to the Thanksgiving holidays, the earliest they could get him into a center is 11/29.  CSW sent patient's information out to SNF's in [place]: Insurance underwriter, [facility] and [facility].   All three skilled nursing facilities denied patient. CSW advised by admissions director at [facility] that they denied as patient was participating in therapy. CSW later informed by admissions director at [facility] that they will accept patient, as patient's brother went to [facility] to talk with admissions director regarding his brother. CSW contacted [facility] (4:14 pm) and was transferred to [facility] - [phone]. Talked with Lauren regarding patient and his situation and was emailed a [facility] Admissions Intake Form that must be completed and faxed back with requested clinicals. CSW updated patient and his brother, who was at the bedside regarding what has to take place before patient can discharge and both expressed understanding. CSW will work with [facility] to  get patient an [facility] in [place] so that he can discharge to [place] for rehab.  Keir Foland Givens, MSW, LCSW Licensed Clinical Social Worker [place] [phone]

## 2018-08-12 NOTE — Progress Notes (Signed)
CARDIAC REHAB PHASE I   Pt in recliner eating lunch. Pt states he has has a long day on dialysis, and is asking a walk be deferred for an hour. Strongly encouraged pt to walk with PT today. Will continue to follow.  Rufina Falco, RN BSN 08/12/2018 2:32 PM

## 2018-08-13 NOTE — Progress Notes (Signed)
Patient ID: Jeffrey [name], male   DOB: April 07, 1956, 62 y.o.   MRN: [id number] [facility] Progress Note   Assessment/ Plan:   1. Severe aortic insufficiency status post aortic valve replacement on 07/28/2018 by cardiothoracic surgery.  Working towards placement upon DC.  2. ESRD: He is usually on home hemodialysis 4 days a week and has been transitioned to a Monday/Wednesday/Friday schedule while here at the hospital. HD tomorrow to continue MWF schedule (altered by holiday next week). He can not be accommodated at a local HD unit in [place] (behavior issues in past).  3. Anemia: Without overt blood loss and stable hemoglobin/hematocrit following surgery.  We will continue to monitor on high-dose Aranesp. 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 5. Nutrition: Continue current diet/nutritional supplementation. 6. Hypertension: Blood pressures fairly controlled, continue to monitor with ultrafiltration/hemodialysis.  Euvolemic on exam.  Subjective:   No emerging complaints--feeling better this AM.     Objective:   BP 125/78 (BP Location: Right Arm)   Pulse 65   Temp 97.9 F (36.6 C) (Oral)   Resp 19   Ht 6' (1.829 m)   Wt 71 kg   SpO2 100%   BMI 21.23 kg/m   Physical Exam: [id number] comfortably in bed,  CVS: Pulse regular rhythm and normal rate, heart sounds S1 and S2 normal Resp: Clear to auscultation, no rales/rhonchi Abd: Soft, flat, nontender Ext: No edema over lower extremities, left RCF with palpable thrill/bruit.  Labs: BMET Recent Labs  Lab 08/08/18 0726 08/12/18 0323  NA 135 133*  K 3.9 4.6  CL 97* 96*  CO2 27 28  GLUCOSE 106* 98  BUN 40* 32*  CREATININE 8.91* 6.80*  CALCIUM 8.5* 8.6*  PHOS 3.6  --    CBC Recent Labs  Lab 08/08/18 0726 08/12/18 0323  WBC 6.3 9.2  HGB 9.0* 8.1*  HCT 30.0* 26.8*  MCV 94.3 94.4  PLT 239 249   Medications:    . amiodarone  200 mg Oral Daily  .  amLODipine  10 mg Oral QHS  . aspirin EC  325 mg Oral Daily   Or  . aspirin  324 mg Per Tube Daily  . bictegravir-emtricitabine-tenofovir AF  1 tablet Oral Daily  . calcium acetate  2,001 mg Oral TID WC  . cloNIDine  0.3 mg Oral TID  . darbepoetin (ARANESP) injection - DIALYSIS  200 mcg Intravenous Q Mon-HD  . enoxaparin (LOVENOX) injection  30 mg Subcutaneous Daily  . feeding supplement (PRO-STAT SUGAR FREE 64)  30 mL Oral BID  . hydrALAZINE  100 mg Oral TID  . isosorbide mononitrate  20 mg Oral BID  . lacosamide  150 mg Oral BID  . multivitamin  1 tablet Oral Daily  . pantoprazole  40 mg Oral Daily  . sodium chloride flush  3 mL Intravenous Q12H   Elmarie Shiley, MD 08/13/2018, 10:12 AM

## 2018-08-13 NOTE — Progress Notes (Signed)
CARDIAC REHAB PHASE I   PRE:  Rate/Rhythm: 76 SR    BP: sitting 123/77    SaO2: 97 RA  MODE:  Ambulation: 390 ft   POST:  Rate/Rhythm: 114 ST    BP: sitting 157/89     SaO2: 97 RA  Pt willing to walk. Moved himself to EOB and stood with mod assist x2 (should rock but doesn't). Steady with RW, better stride today. Able to walk to end of hall then sat and rested x6 min. Brother very supportive and encouraging. Pt ambulated back to room without rest but declined recliner. Encouraged IS x10 hr. [id number]  Darrick Meigs CES, ACSM 08/13/2018 11:40 AM

## 2018-08-13 NOTE — Progress Notes (Addendum)
      [address]       [place] [address]             [phone]      16 Days Post-Op Procedure(s) (LRB): AORTIC VALVE REPLACEMENT (AVR) using Inspiris Resilia  size 23 Aortic Valve. (N/A) TRANSESOPHAGEAL ECHOCARDIOGRAM (TEE) (N/A)   Subjective:  Patient states he is "so so".  He really wants to go home.  He does not want to go to a facility.  Objective: Vital signs in last 24 hours: Temp:  [97.9 F (36.6 C)-98.6 F (37 C)] 97.9 F (36.6 C) (11/23 0404) Pulse Rate:  [64-75] 65 (11/23 0404) Cardiac Rhythm: Normal sinus rhythm (11/23 0700) Resp:  [14-19] 19 (11/23 0404) BP: (117-163)/(77-91) 125/78 (11/23 0404) SpO2:  [100 %] 100 % (11/23 0404) Weight:  [71 kg] 71 kg (11/22 1243)  Intake/Output from previous day: 11/22 0701 - 11/23 0700 In: -  Out: 2000   General appearance: alert, cooperative and no distress Heart: regular rate and rhythm, + systolic murmur Lungs: clear to auscultation bilaterally Abdomen: soft, non-tender; bowel sounds normal; no masses,  no organomegaly Extremities: extremities normal, atraumatic, no cyanosis or edema Wound: clean and dry  Lab Results: Recent Labs    08/12/18 0323  WBC 9.2  HGB 8.1*  HCT 26.8*  PLT 249   BMET:  Recent Labs    08/12/18 0323  NA 133*  K 4.6  CL 96*  CO2 28  GLUCOSE 98  BUN 32*  CREATININE 6.80*  CALCIUM 8.6*    PT/INR: No results for input(s): LABPROT, INR in the last 72 hours. ABG    Component Value Date/Time   PHART 7.333 (L) 07/29/2018 0439   HCO3 20.5 07/29/2018 0439   TCO2 21 (L) 07/29/2018 1609   ACIDBASEDEF 5.0 (H) 07/29/2018 0439   O2SAT 92.0 07/29/2018 0439   CBG (last 3)  No results for input(s): GLUCAP in the last 72 hours.  Assessment/Plan: S/P Procedure(s) (LRB): AORTIC VALVE REPLACEMENT (AVR) using Inspiris Resilia  size 23 Aortic Valve. (N/A) TRANSESOPHAGEAL ECHOCARDIOGRAM (TEE) (N/A)  1. CV- Sinus Bradycardia, BP is elevated at times- on Amiodarone,  Norvasc, Catapres, Hydralazine, Isosorbide 2. Pulm- wean oxygen as tolerated, continue IS 3. Renal- ESRD, on dialysis, per nephrology 4. Deconditioning- SNF placement due to dialysis has been difficult, social work is working on appropriate placement, patient wishes to go home 5. Dispo- patient stable, dialysis per nephrology, continue current care, dispo arrangements being made   LOS: 16 days    Erin Barrett 08/13/2018  I have seen and examined the patient and agree with the assessment and plan as outlined.  Disposition remains unclear.  Rexene Alberts, MD 08/13/2018 11:56 AM

## 2018-08-14 LAB — CBC
HEMATOCRIT: 26.1 % — AB (ref 39.0–52.0)
HEMOGLOBIN: 8.2 g/dL — AB (ref 13.0–17.0)
MCH: 29.4 pg (ref 26.0–34.0)
MCHC: 31.4 g/dL (ref 30.0–36.0)
MCV: 93.5 fL (ref 80.0–100.0)
Platelets: 234 10*3/uL (ref 150–400)
RBC: 2.79 MIL/uL — AB (ref 4.22–5.81)
RDW: 14.6 % (ref 11.5–15.5)
WBC: 7.4 10*3/uL (ref 4.0–10.5)
nRBC: 0 % (ref 0.0–0.2)

## 2018-08-14 LAB — RENAL FUNCTION PANEL
ALBUMIN: 2.5 g/dL — AB (ref 3.5–5.0)
ANION GAP: 11 (ref 5–15)
BUN: 36 mg/dL — AB (ref 8–23)
CHLORIDE: 93 mmol/L — AB (ref 98–111)
CO2: 26 mmol/L (ref 22–32)
Calcium: 8.5 mg/dL — ABNORMAL LOW (ref 8.9–10.3)
Creatinine, Ser: 6.73 mg/dL — ABNORMAL HIGH (ref 0.61–1.24)
GFR calc Af Amer: 9 mL/min — ABNORMAL LOW (ref >60)
GFR, EST NON AFRICAN AMERICAN: 8 mL/min — AB (ref >60)
Glucose, Bld: 91 mg/dL (ref 70–99)
PHOSPHORUS: 3.7 mg/dL (ref 2.5–4.6)
POTASSIUM: 4.2 mmol/L (ref 3.5–5.1)
Sodium: 130 mmol/L — ABNORMAL LOW (ref 135–145)

## 2018-08-14 MED ORDER — DARBEPOETIN ALFA 200 MCG/0.4ML IJ SOSY: 200.0000 ug | PREFILLED_SYRINGE | INTRAMUSCULAR | Status: DC

## 2018-08-14 MED ORDER — HEPARIN SODIUM (PORCINE) 1000 UNIT/ML DIALYSIS: 40.0000 [IU]/kg | INTRAMUSCULAR | Status: DC | PRN

## 2018-08-14 MED ORDER — DARBEPOETIN ALFA 200 MCG/0.4ML IJ SOSY
PREFILLED_SYRINGE | INTRAMUSCULAR | Status: AC
  Administered 2018-08-14: 200 ug via INTRAVENOUS
  Filled 2018-08-14: qty 0.4

## 2018-08-14 MED ORDER — DARBEPOETIN ALFA 200 MCG/0.4ML IJ SOSY
200.0000 ug | PREFILLED_SYRINGE | INTRAMUSCULAR | Status: AC
  Administered 2018-08-14: 200 ug via INTRAVENOUS
  Filled 2018-08-14: qty 0.4

## 2018-08-14 MED ORDER — DARBEPOETIN ALFA 200 MCG/0.4ML IJ SOSY
200.0000 ug | PREFILLED_SYRINGE | INTRAMUSCULAR | Status: DC
  Filled 2018-08-14: qty 0.4

## 2018-08-14 NOTE — Progress Notes (Signed)
Received pt from [facility], RN HD tx initiated via 15G x 1 to arterial and 15G buttonhole to venous w/o problem per report Pull/push/flush well w/o problem per report VSS per report Will continue to monitor while on HD tx

## 2018-08-14 NOTE — Progress Notes (Addendum)
      [address]       [place] [address]             [phone]       17 Days Post-Op Procedure(s) (LRB): AORTIC VALVE REPLACEMENT (AVR) using Inspiris Resilia  size 23 Aortic Valve. (N/A) TRANSESOPHAGEAL ECHOCARDIOGRAM (TEE) (N/A)   Subjective:  No new complaints.  Has questions if he is getting dialyzed today.  Objective: Vital signs in last 24 hours: Temp:  [97.5 F (36.4 C)-98.3 F (36.8 C)] 98 F (36.7 C) (11/24 0531) Pulse Rate:  [48-122] 68 (11/24 0545) Cardiac Rhythm: Heart block (11/24 0700) Resp:  [9-24] 9 (11/24 0545) BP: (138-157)/(74-89) 145/85 (11/24 0531) SpO2:  [79 %-100 %] 79 % (11/24 0545) Weight:  [71.8 kg] 71.8 kg (11/24 0545)  Intake/Output from previous day: 11/23 0701 - 11/24 0700 In: 600 [P.O.:600] Out: -   General appearance: alert, cooperative and no distress Heart: regular rate and rhythm Lungs: clear to auscultation bilaterally Abdomen: soft, non-tender; bowel sounds normal; no masses,  no organomegaly Extremities: extremities normal, atraumatic, no cyanosis or edema Wound: clean and dry  Lab Results: Recent Labs    08/12/18 0323  WBC 9.2  HGB 8.1*  HCT 26.8*  PLT 249   BMET:  Recent Labs    08/12/18 0323  NA 133*  K 4.6  CL 96*  CO2 28  GLUCOSE 98  BUN 32*  CREATININE 6.80*  CALCIUM 8.6*    PT/INR: No results for input(s): LABPROT, INR in the last 72 hours. ABG    Component Value Date/Time   PHART 7.333 (L) 07/29/2018 0439   HCO3 20.5 07/29/2018 0439   TCO2 21 (L) 07/29/2018 1609   ACIDBASEDEF 5.0 (H) 07/29/2018 0439   O2SAT 92.0 07/29/2018 0439   CBG (last 3)  No results for input(s): GLUCAP in the last 72 hours.  Assessment/Plan: S/P Procedure(s) (LRB): AORTIC VALVE REPLACEMENT (AVR) using Inspiris Resilia  size 23 Aortic Valve. (N/A) TRANSESOPHAGEAL ECHOCARDIOGRAM (TEE) (N/A)  1. CV- hemodynamically stable, continue Amiodarone, Norvasc, Catapres, Hydralazine, Isosorbide 2. Pulm- no  acute issues, continue IS 3. Renal- ESRD, per Nephrology note, plan is to dialyze today,  4. Deconditioning- SNF placement is in the works, he will be ready for discharge once arrangements can be finalized 5. Dispo- patient stable, continue current care   LOS: 17 days    Erin Barrett 08/14/2018  I have seen and examined the patient and agree with the assessment and plan as outlined.  Patient and his brother have been upset with staff due to questions regarding what time he will go for HD today.  Rexene Alberts, MD 08/14/2018 10:26 AM

## 2018-08-14 NOTE — Progress Notes (Signed)
Patient off the unit to HD

## 2018-08-14 NOTE — Progress Notes (Signed)
HD tx completed @ 1905 w/o problem UF goal not met d/t UF goal was off for a bit prior to me receiving pt Blood rinsed back VSS Report called to [employer], Therapist, sports

## 2018-08-14 NOTE — Progress Notes (Signed)
Patient ID: Jeffrey [name], male   DOB: 08-30-1956, 62 y.o.   MRN: [id number] [facility] Progress Note   Assessment/ Plan:   1. Severe aortic insufficiency status post aortic valve replacement on 07/28/2018 by cardiothoracic surgery.  Working towards placement upon DC.  2. ESRD: He is usually on home hemodialysis 4 days a week and has been transitioned to a Monday/Wednesday/Friday schedule while here at the hospital. HD today (holiday schedule). He can not be accommodated at a local HD unit in [place] (behavior issues in past) and needs unit in [place].  3. Anemia: Without overt blood loss and stable hemoglobin/hematocrit following surgery.  We will continue to monitor on high-dose Aranesp. 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 5. Nutrition: Continue current diet/nutritional supplementation. 6. Hypertension: Blood pressures fairly controlled, continue to monitor with ultrafiltration/hemodialysis.  Euvolemic on exam.  Subjective:   No emerging complaints--inquiring about postponing HD to tomorrow.     Objective:   BP (!) 145/85 (BP Location: Right Arm)   Pulse 68   Temp 98 F (36.7 C) (Oral)   Resp (!) 9   Ht 6' (1.829 m)   Wt 71.8 kg   SpO2 (!) 79%   BMI 21.47 kg/m   Physical Exam: [id number] comfortably in bed,  CVS: Pulse regular rhythm and normal rate, heart sounds S1 and S2 normal Resp: Clear to auscultation, no rales/rhonchi Abd: Soft, flat, nontender Ext: No edema over lower extremities, left RCF with palpable thrill/bruit.  Labs: BMET Recent Labs  Lab 08/08/18 0726 08/12/18 0323  NA 135 133*  K 3.9 4.6  CL 97* 96*  CO2 27 28  GLUCOSE 106* 98  BUN 40* 32*  CREATININE 8.91* 6.80*  CALCIUM 8.5* 8.6*  PHOS 3.6  --    CBC Recent Labs  Lab 08/08/18 0726 08/12/18 0323  WBC 6.3 9.2  HGB 9.0* 8.1*  HCT 30.0* 26.8*  MCV 94.3 94.4  PLT 239 249   Medications:    . amiodarone  200 mg Oral  Daily  . amLODipine  10 mg Oral QHS  . aspirin EC  325 mg Oral Daily   Or  . aspirin  324 mg Per Tube Daily  . bictegravir-emtricitabine-tenofovir AF  1 tablet Oral Daily  . calcium acetate  2,001 mg Oral TID WC  . cloNIDine  0.3 mg Oral TID  . darbepoetin (ARANESP) injection - DIALYSIS  200 mcg Intravenous Q Mon-HD  . enoxaparin (LOVENOX) injection  30 mg Subcutaneous Daily  . feeding supplement (PRO-STAT SUGAR FREE 64)  30 mL Oral BID  . hydrALAZINE  100 mg Oral TID  . isosorbide mononitrate  20 mg Oral BID  . lacosamide  150 mg Oral BID  . multivitamin  1 tablet Oral Daily  . pantoprazole  40 mg Oral Daily  . sodium chloride flush  3 mL Intravenous Q12H   Elmarie Shiley, MD 08/14/2018, 9:34 AM

## 2018-08-15 NOTE — Progress Notes (Signed)
Patient ID: Jeffrey [name], male   DOB: [birth year], 61 y.o.   MRN: [id number] [facility] Progress Note   Assessment/ Plan:   1. Severe aortic insufficiency status post aortic valve replacement 07/28/2018: Working towards SNF placement for rehab upon DC. 2. ESRD: pt usually on home hemodialysis 4 days per week, in the hospital we have him on 4hrs  Monday/Wednesday/Friday schedule. This week is a holiday schedule, Sun-Tues-Friday for this patient while in the hospital here.  Upon discharge he can not be accommodated at a local HD unit in [place] due to behavior issues in past, I have d/w Education officer, museum who is aware.  3. Anemia of CKD: got darbe 200 ug yest 11/24. Hb 8-9 range here.  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 5. Nutrition: Continue current diet/nutritional supplementation. 6. Hypertension: Blood pressure remain high. Admit weight was 69kg, up 3kg today.  7. HIV - on usual meds 8. Hep B+ carrier - HD in isolation room upstairs  Kelly Splinter MD [employer] pgr [phone]   08/15/2018, 10:39 AM    Subjective:   Multiple complaints about dialysis. Nothing specific.     Objective:   BP (!) 165/98   Pulse 91   Temp 98.2 F (36.8 C) (Oral)   Resp (!) 23   Ht 6' (1.829 m)   Wt 72 kg Comment: Scale B  SpO2 96%   BMI 21.54 kg/m   Physical Exam: [id number] comfortably in bed,  CVS: Pulse regular rhythm and normal rate, heart sounds S1 and S2 normal Resp: Clear to auscultation, no rales/rhonchi Abd: Soft, flat, nontender Ext: No edema over lower extremities, left RCF with palpable thrill/bruit.  Labs: BMET Recent Labs  Lab 08/12/18 0323 08/14/18 1850  NA 133* 130*  K 4.6 4.2  CL 96* 93*  CO2 28 26  GLUCOSE 98 91  BUN 32* 36*  CREATININE 6.80* 6.73*  CALCIUM 8.6* 8.5*  PHOS  --  3.7   CBC Recent Labs  Lab 08/12/18 0323 08/14/18 1850  WBC 9.2 7.4  HGB 8.1* 8.2*  HCT  26.8* 26.1*  MCV 94.4 93.5  PLT 249 234   Medications:    . amiodarone  200 mg Oral Daily  . amLODipine  10 mg Oral QHS  . aspirin EC  325 mg Oral Daily   Or  . aspirin  324 mg Per Tube Daily  . bictegravir-emtricitabine-tenofovir AF  1 tablet Oral Daily  . calcium acetate  2,001 mg Oral TID WC  . cloNIDine  0.3 mg Oral TID  . [START ON 08/22/2018] darbepoetin (ARANESP) injection - DIALYSIS  200 mcg Intravenous Q Mon-HD  . enoxaparin (LOVENOX) injection  30 mg Subcutaneous Daily  . feeding supplement (PRO-STAT SUGAR FREE 64)  30 mL Oral BID  . hydrALAZINE  100 mg Oral TID  . isosorbide mononitrate  20 mg Oral BID  . lacosamide  150 mg Oral BID  . multivitamin  1 tablet Oral Daily  . pantoprazole  40 mg Oral Daily  . sodium chloride flush  3 mL Intravenous Q12H

## 2018-08-15 NOTE — Progress Notes (Signed)
CARDIAC REHAB PHASE I   Offered to walk with pt, pt refusing to walk due to not feeling good because of dialysis yesterday. Offered to give pt some time and let his pain meds work, pt continues to adamently refuse to walk, or get out of bed to chair. Encouraged to work with PT later. Will continue to follow and encourage ambulation.  [phone] Rufina Falco, RN BSN 08/15/2018 10:23 AM

## 2018-08-15 NOTE — Progress Notes (Signed)
PT Cancellation Note  Patient Details Name: Jeffrey [name] MRN: [id number] DOB: 18-Apr-1956   Cancelled Treatment:    Reason Eval/Treat Not Completed: Patient declined, no reason specified Pt refusing, stating he was in pain. Pt reports he would do his exercises and get to the chair in the afternoon. Even with max encouragement to participate, pt continued to refuse. Will follow up as schedule allows.   Jeffrey [name], PT, DPT  Acute Rehabilitation Services  Pager: [phone] Office: [phone]    Rudean Hitt 08/15/2018, 11:23 AM

## 2018-08-15 NOTE — Progress Notes (Addendum)
      [address]       [place] [address]             [phone]      18 Days Post-Op Procedure(s) (LRB): AORTIC VALVE REPLACEMENT (AVR) using Inspiris Resilia  size 23 Aortic Valve. (N/A) TRANSESOPHAGEAL ECHOCARDIOGRAM (TEE) (N/A) Subjective: He does not feel good this morning. Having a lot of incisional pain. He is upset at the dialysis staff because he feels like they do not listen to what he does at home with his home HD.  Objective: Vital signs in last 24 hours: Temp:  [98.2 F (36.8 C)-98.3 F (36.8 C)] 98.2 F (36.8 C) (11/24 2000) Pulse Rate:  [70-91] 91 (11/24 1930) Cardiac Rhythm: Heart block (11/25 0700) Resp:  [13-23] 23 (11/24 2000) BP: (136-184)/(89-149) 161/149 (11/25 0709) SpO2:  [96 %-100 %] 96 % (11/24 1930) Weight:  [72 kg-74 kg] 72 kg (11/25 0323)     Intake/Output from previous day: 11/24 0701 - 11/25 0700 In: 483 [P.O.:480; I.V.:3] Out: 1780  Intake/Output this shift: No intake/output data recorded.  General appearance: alert, cooperative and no distress Heart: regular rate and rhythm, S1, S2 normal, no murmur, click, rub or gallop Lungs: clear to auscultation bilaterally Abdomen: soft, non-tender; bowel sounds normal; no masses,  no organomegaly Extremities: extremities normal, atraumatic, no cyanosis or edema Wound: clean and dry  Lab Results: Recent Labs    08/14/18 1850  WBC 7.4  HGB 8.2*  HCT 26.1*  PLT 234   BMET:  Recent Labs    08/14/18 1850  NA 130*  K 4.2  CL 93*  CO2 26  GLUCOSE 91  BUN 36*  CREATININE 6.73*  CALCIUM 8.5*    PT/INR: No results for input(s): LABPROT, INR in the last 72 hours. ABG    Component Value Date/Time   PHART 7.333 (L) 07/29/2018 0439   HCO3 20.5 07/29/2018 0439   TCO2 21 (L) 07/29/2018 1609   ACIDBASEDEF 5.0 (H) 07/29/2018 0439   O2SAT 92.0 07/29/2018 0439   CBG (last 3)  No results for input(s): GLUCAP in the last 72 hours.  Assessment/Plan: S/P Procedure(s)  (LRB): AORTIC VALVE REPLACEMENT (AVR) using Inspiris Resilia  size 23 Aortic Valve. (N/A) TRANSESOPHAGEAL ECHOCARDIOGRAM (TEE) (N/A)  1. CV-continue Amio, Norvasc, Catapres, Hydralazine, and Isosorbide. NSR in the 70s. BP high this morning-PRN hydralazine available. 2. Pulm-tolerating room air with no issues, continue IS 3. Renal-HD per nephrology. Patient feels they are taking too much fluid off him at dialysis and then is too tired to work with PT.  4. H and H 8.2/26.1, expected acute blood loss anemia 5. Chest pain over his incision and on his right sternal boarder. Last CXR was 11/20 which showed an unchanged left pleural effusion. Reminded him of sternal precautions and to ask for help when getting up out of bed.  Plan: Working on SNF placement that has HD available. Case management following and assisting.     LOS: 18 days    Elgie Collard 08/15/2018 Patient seen and examined. Agree with above Refused PT and cardiac rehab today He is aware that refusal to cooperate will slow his recovery and increase his risk of complications  Remo Lipps C. Roxan Hockey, MD [facility] [phone]

## 2018-08-15 NOTE — Consult Note (Signed)
   [facility] CM Inpatient Consult   08/15/2018  ANANIAS KOLANDER 03-Oct-1955 [id number]    Patient was active with [facility] in the past. However, it was difficult in maintaining contact with patient or brother.   Spoke with inpatient RNCM. Disposition plan is likely SNF upon discharge.     [name], MSN-Ed, RN,BSN [facility] Liaison [phone]

## 2018-08-15 NOTE — Progress Notes (Signed)
Patient did c/o not feeling good and of pain at the incision site and  the  Upper abdomen. Rated at 10/10. Pt is constipated and had not had a BM since last Sunday  (Per Pt.).  Pt is passing Gas.I did give him  Miralax.

## 2018-08-16 ENCOUNTER — Ambulatory Visit: Payer: Medicare Other | Admitting: Podiatry

## 2018-08-16 MED ORDER — SODIUM CHLORIDE 0.9 % IV SOLN
125.0000 mg | INTRAVENOUS | Status: DC
  Administered 2018-08-17: 125 mg via INTRAVENOUS
  Filled 2018-08-16 (×3): qty 10

## 2018-08-16 NOTE — Progress Notes (Addendum)
[address]       [place], [address]             [phone]      19 Days Post-Op Procedure(s) (LRB): AORTIC VALVE REPLACEMENT (AVR) using Inspiris Resilia  size 23 Aortic Valve. (N/A) TRANSESOPHAGEAL ECHOCARDIOGRAM (TEE) (N/A) Subjective: Feels weak and tired but a little better today  Objective: Vital signs in last 24 hours: Temp:  [97.8 F (36.6 C)-98.7 F (37.1 C)] 98.7 F (37.1 C) (11/26 0555) Pulse Rate:  [64-82] 64 (11/25 2334) Cardiac Rhythm: Atrial flutter;Sinus bradycardia;Heart block (11/25 2334) Resp:  [16-23] 23 (11/26 0555) BP: (128-165)/(57-98) 153/69 (11/26 0555) SpO2:  [97 %-100 %] 97 % (11/26 0555) Weight:  [73.1 kg] 73.1 kg (11/26 0555)  Hemodynamic parameters for last 24 hours:    Intake/Output from previous day: 11/25 0701 - 11/26 0700 In: 500 [P.O.:500] Out: -  Intake/Output this shift: No intake/output data recorded.  General appearance: alert, cooperative, distracted, fatigued and no distress Heart: irregularly irregular rhythm Lungs: dim in lower fields Abdomen: + BS, soft, nontender Extremities: no edema Wound: incis healing well  Lab Results: Recent Labs    08/14/18 1850  WBC 7.4  HGB 8.2*  HCT 26.1*  PLT 234   BMET:  Recent Labs    08/14/18 1850  NA 130*  K 4.2  CL 93*  CO2 26  GLUCOSE 91  BUN 36*  CREATININE 6.73*  CALCIUM 8.5*    PT/INR: No results for input(s): LABPROT, INR in the last 72 hours. ABG    Component Value Date/Time   PHART 7.333 (L) 07/29/2018 0439   HCO3 20.5 07/29/2018 0439   TCO2 21 (L) 07/29/2018 1609   ACIDBASEDEF 5.0 (H) 07/29/2018 0439   O2SAT 92.0 07/29/2018 0439   CBG (last 3)  No results for input(s): GLUCAP in the last 72 hours.  Meds Scheduled Meds: . amiodarone  200 mg Oral Daily  . amLODipine  10 mg Oral QHS  . aspirin EC  325 mg Oral Daily   Or  . aspirin  324 mg Per Tube Daily  . bictegravir-emtricitabine-tenofovir AF  1 tablet Oral Daily  .  calcium acetate  2,001 mg Oral TID WC  . cloNIDine  0.3 mg Oral TID  . [START ON 08/22/2018] darbepoetin (ARANESP) injection - DIALYSIS  200 mcg Intravenous Q Mon-HD  . enoxaparin (LOVENOX) injection  30 mg Subcutaneous Daily  . feeding supplement (PRO-STAT SUGAR FREE 64)  30 mL Oral BID  . hydrALAZINE  100 mg Oral TID  . isosorbide mononitrate  20 mg Oral BID  . lacosamide  150 mg Oral BID  . multivitamin  1 tablet Oral Daily  . pantoprazole  40 mg Oral Daily  . sodium chloride flush  3 mL Intravenous Q12H   Continuous Infusions: . sodium chloride    . sodium chloride    . sodium chloride    . ferric gluconate (FERRLECIT/NULECIT) IV Stopped (08/12/18 1244)   PRN Meds:.sodium chloride, sodium chloride, sodium chloride, acetaminophen, albuterol, ALPRAZolam, hydrALAZINE, HYDROmorphone, lidocaine, lidocaine (PF), lidocaine-prilocaine, metoprolol tartrate, ondansetron (ZOFRAN) IV, pentafluoroprop-tetrafluoroeth, polyethylene glycol, sodium chloride flush, traMADol, traZODone  Xrays No results found.  Assessment/Plan: S/P Procedure(s) (LRB): AORTIC VALVE REPLACEMENT (AVR) using Inspiris Resilia  size 23 Aortic Valve. (N/A) TRANSESOPHAGEAL ECHOCARDIOGRAM (TEE) (N/A)   1 hemodyn stable with some hypertensive readings, afib/flutter at times with some bradycardia. Cont amio and current meds - nephrology assisting with HTN meds 2 ESRD/HD per nephrology  3 sats good on RA, no SOB 4 no new labs or CXR today 5 awaits placement, push rehab and pulm toilet as able while here   LOS: 19 days    John Giovanni PA-C    pager [phone] 08/16/2018 Patient seen and examined, agree with above No significant change Slow progress  Remo Lipps C. Roxan Hockey, MD [facility] [phone]

## 2018-08-16 NOTE — Clinical Social Work Note (Signed)
CSW received call from [place] with [facility] in [place] [phone]) regarding HD for patient. CSW advised that they do not have any MWF availability, only TTS. Call made to patient's brother, [name], [phone]) and informed him of scheduling. Per brother a TTS schedule is fine. CSW also contacted [place], [place], site at [place] and they can transport patient TTS. [place] with [facility] contacted and informed. CSW will continue to follow, provide SW intervention services as needed and facilitate discharge to [facility] once outpatient dialysis set-up.  Kiran Lapine Givens, MSW, LCSW Licensed Clinical Social Worker [place] [phone]

## 2018-08-16 NOTE — Progress Notes (Signed)
CCMD called to report Pt's EKG often time is sinus bradycardia, sinus pause and atrial flutter, HR 38-70. Most of the time EKG is sinus rhythm with 1st degree AV block on monitor, BP remains stable and he is asymptomatic. Will continue to monitor.  Kennyth Lose, RN

## 2018-08-16 NOTE — Progress Notes (Signed)
Physical Therapy Treatment Patient Details Name: Jeffrey [name] MRN: [id number] DOB: Dec 04, 1955 Today's Date: 08/16/2018    History of Present Illness 62 y.o. male admitted on 07/09/18 for seizure with AMS/increased somnolence and missed HD, CXR showed plural effusion, and CT was negative.  Pt also with anemia/thrombocytopenia, hypertensive urgency.  Pt with seizure during HD on 07/10/18 and code blue called due to loss of pulse.  Pt was intubated and transferred to ICU (intubated 10/20-10/24/19).  Pt with other significant PMH of HIV, Hep B, ESRD on HD (at home), aortic valve stenosis, I and D of L knee, multiple AVF placements/revisions.    PT Comments    Patient seen for activity progression. Max encouragement to perform. Tolerated ambulation with RW 2 standing rest breaks and min assist at all times for stability. Remains limited by activity tolerance and fatigue. Current POC remains appropriate. Will need SNF.   Follow Up Recommendations  SNF;Supervision for mobility/OOB     Equipment Recommendations  None recommended by PT    Recommendations for Other Services       Precautions / Restrictions Precautions Precautions: Fall Precaution Comments: mildly unsteady on his feet.    Mobility  Bed Mobility               General bed mobility comments: received sitting EOB  Transfers Overall transfer level: Needs assistance Equipment used: Rolling walker (2 wheeled) Transfers: Sit to/from [facility] Sit to Stand: Min assist         General transfer comment: Min assist to power up to standing from elevated bed height, poor compliance with precautions  Ambulation/Gait Ambulation/Gait assistance: Min assist Gait Distance (Feet): 90 Feet Assistive device: Rolling walker (2 wheeled) Gait Pattern/deviations: Step-through pattern;Staggering left Gait velocity: decreased Gait velocity interpretation: <1.31 ft/sec, indicative of household ambulator General  Gait Details: Max encouragement to perform, patient extremely self limiting. Min assist for stability despite use of RW. Reports + dizziness    Stairs             Wheelchair Mobility    Modified Rankin (Stroke Patients Only)       Balance Overall balance assessment: Needs assistance Sitting-balance support: Feet supported;No upper extremity supported Sitting balance-Leahy Scale: Good     Standing balance support: No upper extremity supported;During functional activity Standing balance-Leahy Scale: Fair                              Cognition Arousal/Alertness: Awake/alert Behavior During Therapy: Flat affect Overall Cognitive Status: Impaired/Different from baseline Area of Impairment: Memory;Problem solving                 Orientation Level: Disoriented to;Place;Time;Situation Current Attention Level: Sustained Memory: Decreased short-term memory Following Commands: Follows one step commands inconsistently   Awareness: Intellectual Problem Solving: Slow processing General Comments: Patient reluctant to participate in therapies, max encouargement and decreased ability to comply with precautions      Exercises      General Comments        Pertinent Vitals/Pain Pain Assessment: No/denies pain    Home Living                      Prior Function            PT Goals (current goals can now be found in the care plan section) Acute Rehab PT Goals Patient Stated Goal: to go home today PT Goal Formulation:  With patient Time For Goal Achievement: 08/01/18 Potential to Achieve Goals: Good Progress towards PT goals: Progressing toward goals    Frequency    Min 2X/week      PT Plan Current plan remains appropriate    Co-evaluation              AM-PAC PT "6 Clicks" Mobility   Outcome Measure  Help needed turning from your back to your side while in a flat bed without using bedrails?: A Little Help needed moving from  lying on your back to sitting on the side of a flat bed without using bedrails?: A Little Help needed moving to and from a bed to a chair (including a wheelchair)?: A Lot Help needed standing up from a chair using your arms (e.g., wheelchair or bedside chair)?: A Lot Help needed to walk in hospital room?: A Little Help needed climbing 3-5 steps with a railing? : A Lot 6 Click Score: 15    End of Session Equipment Utilized During Treatment: Gait belt Activity Tolerance: Patient tolerated treatment well Patient left: in chair(transport chair for HD) Nurse Communication: Mobility status PT Visit Diagnosis: Muscle weakness (generalized) (M62.81);Difficulty in walking, not elsewhere classified (R26.2)     Time: 5974-1638 PT Time Calculation (min) (ACUTE ONLY): 19 min  Charges:  $Gait Training: 8-22 mins                     Alben Deeds, PT DPT  Board Certified Neurologic Specialist [place] Pager [phone] Office [place] 08/16/2018, 12:12 PM

## 2018-08-16 NOTE — Progress Notes (Addendum)
Patient ID: Jeffrey [name], male   DOB: 07-19-1956, 62 y.o.   MRN: [id number] [facility] Progress Note   Assessment/ Plan:   1. Severe aortic insufficiency status post aortic valve replacement 07/28/2018: Working towards SNF placement for rehab upon DC.  Upon dc pt cannot be placed at a local HD unit in [place] due to behavior issues in past, Education officer, museum is aware.  SW looking for a [facility] unit that will accept him. 2. ESRD: pt usually on home hemodialysis 4 days per week, in the hospital we have him on 4hrs 3x per week. Had HD Sunday, he wishes to run Wed HD instead of today which is fine. Final Rx this week pt requesting Sat which is fine if still here.    3. Anemia of CKD: got darbe 200 ug yest 11/24. Hb 8-9 range here.  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 5. Nutrition: Continue current diet/nutritional supplementation. 6. Hypertension: Blood pressure remains up. Dry wt now 71 kg per pt. 7. HIV - on usual meds 8. Hep B+ carrier - HD in isolation room upstairs  Kelly Splinter MD [employer] pgr [phone]   08/16/2018, 10:37 AM    Subjective:   No c/o today, wants to do next HD Wed.     Dialysis: Home hemodialysis 4 days / week  71kg  L arm AVF   Heparin ?  Time ?    Objective:   BP (!) 145/81   Pulse 64   Temp 98.7 F (37.1 C) (Oral)   Resp (!) 23   Ht 6' (1.829 m)   Wt 73.1 kg   SpO2 97%   BMI 21.86 kg/m   Physical Exam: [id number] comfortably in bed,  CVS: Pulse regular rhythm and normal rate, heart sounds S1 and S2 normal Resp: Clear to auscultation, no rales/rhonchi Abd: Soft, flat, nontender Ext: No edema over lower extremities, left RCF with palpable thrill/bruit.  Labs: BMET Recent Labs  Lab 08/12/18 0323 08/14/18 1850  NA 133* 130*  K 4.6 4.2  CL 96* 93*  CO2 28 26  GLUCOSE 98 91  BUN 32* 36*  CREATININE 6.80* 6.73*  CALCIUM 8.6* 8.5*  PHOS  --  3.7    CBC Recent Labs  Lab 08/12/18 0323 08/14/18 1850  WBC 9.2 7.4  HGB 8.1* 8.2*  HCT 26.8* 26.1*  MCV 94.4 93.5  PLT 249 234   Medications:    . amiodarone  200 mg Oral Daily  . amLODipine  10 mg Oral QHS  . aspirin EC  325 mg Oral Daily   Or  . aspirin  324 mg Per Tube Daily  . bictegravir-emtricitabine-tenofovir AF  1 tablet Oral Daily  . calcium acetate  2,001 mg Oral TID WC  . cloNIDine  0.3 mg Oral TID  . [START ON 08/22/2018] darbepoetin (ARANESP) injection - DIALYSIS  200 mcg Intravenous Q Mon-HD  . enoxaparin (LOVENOX) injection  30 mg Subcutaneous Daily  . feeding supplement (PRO-STAT SUGAR FREE 64)  30 mL Oral BID  . hydrALAZINE  100 mg Oral TID  . isosorbide mononitrate  20 mg Oral BID  . lacosamide  150 mg Oral BID  . multivitamin  1 tablet Oral Daily  . pantoprazole  40 mg Oral Daily  . sodium chloride flush  3 mL Intravenous Q12H

## 2018-08-17 ENCOUNTER — Other Ambulatory Visit: Payer: Self-pay | Admitting: Physician Assistant

## 2018-08-17 LAB — RENAL FUNCTION PANEL
ALBUMIN: 2.5 g/dL — AB (ref 3.5–5.0)
Anion gap: 14 (ref 5–15)
BUN: 50 mg/dL — AB (ref 8–23)
CHLORIDE: 91 mmol/L — AB (ref 98–111)
CO2: 24 mmol/L (ref 22–32)
CREATININE: 7.92 mg/dL — AB (ref 0.61–1.24)
Calcium: 8.4 mg/dL — ABNORMAL LOW (ref 8.9–10.3)
GFR calc Af Amer: 8 mL/min — ABNORMAL LOW (ref >60)
GFR calc non Af Amer: 6 mL/min — ABNORMAL LOW (ref >60)
Glucose, Bld: 99 mg/dL (ref 70–99)
POTASSIUM: 4.4 mmol/L (ref 3.5–5.1)
Phosphorus: 3.6 mg/dL (ref 2.5–4.6)
Sodium: 129 mmol/L — ABNORMAL LOW (ref 135–145)

## 2018-08-17 LAB — CBC
HEMATOCRIT: 25.8 % — AB (ref 39.0–52.0)
HEMOGLOBIN: 7.9 g/dL — AB (ref 13.0–17.0)
MCH: 28.4 pg (ref 26.0–34.0)
MCHC: 30.6 g/dL (ref 30.0–36.0)
MCV: 92.8 fL (ref 80.0–100.0)
Platelets: 207 10*3/uL (ref 150–400)
RBC: 2.78 MIL/uL — ABNORMAL LOW (ref 4.22–5.81)
RDW: 14.8 % (ref 11.5–15.5)
WBC: 5.2 10*3/uL (ref 4.0–10.5)
nRBC: 0 % (ref 0.0–0.2)

## 2018-08-17 MED ORDER — PRO-STAT SUGAR FREE PO LIQD: 30.0000 mL | Freq: Two times a day (BID) | ORAL | 0 refills | Status: DC

## 2018-08-17 MED ORDER — CHLORHEXIDINE GLUCONATE CLOTH 2 % EX PADS
6.0000 | MEDICATED_PAD | Freq: Every day | CUTANEOUS | Status: DC
  Administered 2018-08-17 – 2018-08-18 (×2): 6 via TOPICAL

## 2018-08-17 MED ORDER — ACETAMINOPHEN 325 MG PO TABS: 650.0000 mg | ORAL_TABLET | Freq: Four times a day (QID) | ORAL | Status: AC | PRN

## 2018-08-17 MED ORDER — ASPIRIN 325 MG PO TBEC: 325.0000 mg | DELAYED_RELEASE_TABLET | Freq: Every day | ORAL | 0 refills | Status: DC

## 2018-08-17 MED ORDER — TRAMADOL HCL 50 MG PO TABS: 50.0000 mg | ORAL_TABLET | Freq: Two times a day (BID) | ORAL | 0 refills | Status: DC | PRN

## 2018-08-17 MED ORDER — AMIODARONE HCL 200 MG PO TABS: 200.0000 mg | ORAL_TABLET | Freq: Every day | ORAL | 1 refills | Status: DC

## 2018-08-17 MED ORDER — HYDROMORPHONE HCL 2 MG PO TABS
ORAL_TABLET | ORAL | Status: AC
  Administered 2018-08-17: 2 mg via ORAL
  Filled 2018-08-17: qty 1

## 2018-08-17 NOTE — Progress Notes (Signed)
Attempted to contact [facility] to follow up on the patient's  status of outpatient dialysis set up, unable to reach anyone.    Thurmond Butts, [place] Social Worker [phone]

## 2018-08-17 NOTE — Progress Notes (Signed)
CARDIAC REHAB PHASE I   Offered to walk with pt. Pt declining stating he walked yesterday. Encouraged pt that it is important to walk everyday. Pt not receptive. Will continue to follow as pt allows.  Rufina Falco, RN BSN 08/17/2018 10:30 AM

## 2018-08-17 NOTE — Progress Notes (Addendum)
      [address]       [place] [address]             [phone]      20 Days Post-Op Procedure(s) (LRB): AORTIC VALVE REPLACEMENT (AVR) using Inspiris Resilia  size 23 Aortic Valve. (N/A) TRANSESOPHAGEAL ECHOCARDIOGRAM (TEE) (N/A) Subjective: He is having some abdominal pain this morning likely due to constipation.  Objective: Vital signs in last 24 hours: Temp:  [97.9 F (36.6 C)-98.1 F (36.7 C)] 98.1 F (36.7 C) (11/27 0300) Pulse Rate:  [62-69] 62 (11/27 0300) Cardiac Rhythm: Sinus bradycardia;Heart block (11/27 0700) Resp:  [15-20] 20 (11/27 0300) BP: (123-145)/(76-95) 143/76 (11/27 0300) SpO2:  [99 %-100 %] 100 % (11/27 0300)     Intake/Output from previous day: 11/26 0701 - 11/27 0700 In: 3 [I.V.:3] Out: -  Intake/Output this shift: No intake/output data recorded.  General appearance: alert, cooperative and no distress Heart: sinus brady Lungs: clear to auscultation bilaterally Abdomen: soft, non-tender; bowel sounds normal; no masses,  no organomegaly Extremities: extremities normal, atraumatic, no cyanosis or edema Wound: clean and dry  Lab Results: Recent Labs    08/14/18 1850  WBC 7.4  HGB 8.2*  HCT 26.1*  PLT 234   BMET:  Recent Labs    08/14/18 1850  NA 130*  K 4.2  CL 93*  CO2 26  GLUCOSE 91  BUN 36*  CREATININE 6.73*  CALCIUM 8.5*    PT/INR: No results for input(s): LABPROT, INR in the last 72 hours. ABG    Component Value Date/Time   PHART 7.333 (L) 07/29/2018 0439   HCO3 20.5 07/29/2018 0439   TCO2 21 (L) 07/29/2018 1609   ACIDBASEDEF 5.0 (H) 07/29/2018 0439   O2SAT 92.0 07/29/2018 0439   CBG (last 3)  No results for input(s): GLUCAP in the last 72 hours.  Assessment/Plan: S/P Procedure(s) (LRB): AORTIC VALVE REPLACEMENT (AVR) using Inspiris Resilia  size 23 Aortic Valve. (N/A) TRANSESOPHAGEAL ECHOCARDIOGRAM (TEE) (N/A)  1. CV-1st degree HB, rate in the 50s-60s, blood pressure okay this morning 2.  Pulm-tolerating room air with good oxygen saturation 3. Renal-on HD, nephrology following. HD today 4. H and H 8.2/26.1, expected acute blood loss anemia 5. Endo-blood glucose has been well controlled 6. Constipation-upper abdominal pain this morning right below his diaphragm. Likely constipation. He has PRN miralax ordered however, he as not been taking. He is instead asking for more pain medication which I explained will make his constipation worse.   Plan: HD today. Will discharge today as long as arrangements are made for SNF and outpatient HD.    LOS: 20 days    Elgie Collard 08/17/2018 Patient seen and examined, agree with above  Remo Lipps C. Roxan Hockey, MD [facility] [phone]

## 2018-08-17 NOTE — Care Management Important Message (Signed)
Important Message  Patient Details  Name: RAYMIR FROMMELT MRN: [id number] Date of Birth: [birth year]   Medicare Important Message Given:  Yes    Candy Ziegler P Missie Gehrig 08/17/2018, 3:51 PM

## 2018-08-17 NOTE — Progress Notes (Signed)
Patient ID: Jeffrey [name], male   DOB: 05/31/1956, 62 y.o.   MRN: [id number]  [facility] Progress Note   Assessment/ Plan:   1. Severe aortic insufficiency status post aortic valve replacement 07/28/2018: Working towards SNF placement for rehab upon DC.   2. ESRD: pt usually on home hemodialysis 4 days per week, in the hospital we have him on 4hrs 3x per week. Plan HD today. Final Rx this week pt requesting Sat which is fine if still here.  Upon dc pt cannot be placed at a local HD unit in [place], Education officer, museum is aware. 3. Anemia of CKD: got darbe 200 ug yest 11/24. Hb 8-9 range here.  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 5. Nutrition: Continue current diet/nutritional supplementation. 6. Hypertension: Blood pressure remains up. Dry wt 71 kg per pt. 7. HIV - on usual meds 8. Hep B+ carrier - HD in isolation room upstairs  Kelly Splinter MD [employer] pgr [phone]   08/17/2018, 10:53 AM    Subjective:   No c/o today  Dialysis: Home hemodialysis 4 days / week  71kg  L arm AVF   Heparin 300 u (3 hundred units)   Objective:   BP (!) 143/76 (BP Location: Right Arm)   Pulse 62   Temp 98.1 F (36.7 C) (Oral)   Resp 20   Ht 6' (1.829 m)   Wt 73.1 kg   SpO2 100%   BMI 21.86 kg/m   Physical Exam: IOX:[id number] comfortably in bed,  CVS: Pulse regular rhythm and normal rate, heart sounds S1 and S2 normal Resp: Clear to auscultation, no rales/rhonchi Abd: Soft, flat, nontender Ext: No edema over lower extremities, left RCF with palpable thrill/bruit.  Labs: BMET Recent Labs  Lab 08/12/18 0323 08/14/18 1850  NA 133* 130*  K 4.6 4.2  CL 96* 93*  CO2 28 26  GLUCOSE 98 91  BUN 32* 36*  CREATININE 6.80* 6.73*  CALCIUM 8.6* 8.5*  PHOS  --  3.7   CBC Recent Labs  Lab 08/12/18 0323 08/14/18 1850  WBC 9.2 7.4  HGB 8.1* 8.2*  HCT 26.8* 26.1*  MCV 94.4 93.5  PLT 249 234    Medications:    . amiodarone  200 mg Oral Daily  . amLODipine  10 mg Oral QHS  . aspirin EC  325 mg Oral Daily   Or  . aspirin  324 mg Per Tube Daily  . bictegravir-emtricitabine-tenofovir AF  1 tablet Oral Daily  . calcium acetate  2,001 mg Oral TID WC  . Chlorhexidine Gluconate Cloth  6 each Topical Q0600  . cloNIDine  0.3 mg Oral TID  . [START ON 08/22/2018] darbepoetin (ARANESP) injection - DIALYSIS  200 mcg Intravenous Q Mon-HD  . enoxaparin (LOVENOX) injection  30 mg Subcutaneous Daily  . feeding supplement (PRO-STAT SUGAR FREE 64)  30 mL Oral BID  . hydrALAZINE  100 mg Oral TID  . isosorbide mononitrate  20 mg Oral BID  . lacosamide  150 mg Oral BID  . multivitamin  1 tablet Oral Daily  . pantoprazole  40 mg Oral Daily  . sodium chloride flush  3 mL Intravenous Q12H

## 2018-08-18 MED ORDER — LACTULOSE 10 GM/15ML PO SOLN
30.0000 g | Freq: Every day | ORAL | Status: DC | PRN
  Administered 2018-08-18: 30 g via ORAL
  Filled 2018-08-18 (×2): qty 45

## 2018-08-18 NOTE — Progress Notes (Signed)
This RN attempted to walk patient in the hall three times, poatient refused. Patient education completed he verbalized understanding. Patient transfrered from bed to chair twice on this shift  will monitor.

## 2018-08-18 NOTE — Progress Notes (Signed)
Patient c/o abdominal discomfort. Patient stated that he was having some nausea. Educated patient on the importance of having a BM. Bowel sounds active. Patient denies passing gas. Miralax and warm prune juice given to patient. Patient drank all of it. Patient's brother put him on the bedside commode. Patient stated that he was passing gas.   HR dropped to 37. Patient placed back into bed. HR rebounded to 50-60's. B/P stable. 100% on 2L. Lungs diminished with some crackles.   Will continue to monitor

## 2018-08-18 NOTE — Progress Notes (Signed)
Patient is concerned over not having a BM. This RN educated the patient that not ambulating, eating, pain meds and being a dialysis pt are all factors with constipation. Will ask for lactulose or suppository or enema. Patient is acceptable to all choices. Will continue to monitor

## 2018-08-18 NOTE — Plan of Care (Signed)
  Problem: Health Behavior/Discharge Planning: Goal: Ability to manage health-related needs will improve Outcome: Progressing   Problem: Clinical Measurements: Goal: Ability to maintain clinical measurements within normal limits will improve Outcome: Progressing Goal: Will remain free from infection Outcome: Progressing Goal: Respiratory complications will improve Outcome: Progressing   Problem: Activity: Goal: Risk for activity intolerance will decrease Outcome: Progressing   Problem: Clinical Measurements: Goal: Postoperative complications will be avoided or minimized Outcome: Progressing   Problem: Activity: Goal: Risk for activity intolerance will decrease Outcome: Not Progressing

## 2018-08-18 NOTE — Progress Notes (Signed)
Patient ID: Jeffrey [name], male   DOB: [birth year], 62 y.o.   MRN: [id number] [facility] Progress Note   Assessment/ Plan:   1. Severe aortic insufficiency status post aortic valve replacement 07/28/2018: Working towards SNF placement for rehab upon DC.   2. ESRD: pt usually on home hemodialysis 4 days per week, in the hospital we have him on 4hrs 3x per week. Has had 2 HD sessions this week, next HD Sat if still here.  Upon dc pt cannot be placed at a local HD unit in [place], Education officer, museum is aware. 3. Anemia of CKD: got darbe 200 ug yest 11/24. Hb 8-9 range here.  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 5. Nutrition: Continue current diet/nutritional supplementation. 6. Hypertension: Blood pressure remains up. Dry wt 71 kg per pt. 7. HIV - on usual meds 8. Hep B+ carrier - HD in isolation room upstairs  Kelly Splinter MD [employer] pgr [phone]   08/18/2018, 10:53 AM    Subjective:   No c/o today  Dialysis: Home hemodialysis 4 days / week  71kg  L arm AVF   Heparin 300 u (3 hundred units)   Objective:   BP 135/72 (BP Location: Right Arm)   Pulse 65   Temp 98.1 F (36.7 C) (Oral)   Resp 19   Ht 6' (1.829 m)   Wt 72.2 kg   SpO2 99%   BMI 21.59 kg/m   Physical Exam: FIE:[id number] comfortably in bed,  CVS: Pulse regular rhythm and normal rate, heart sounds S1 and S2 normal Resp: Clear to auscultation, no rales/rhonchi Abd: Soft, flat, nontender Ext: No edema over lower extremities, left RCF with palpable thrill/bruit.  Labs: BMET Recent Labs  Lab 08/12/18 0323 08/14/18 1850 08/17/18 1357  NA 133* 130* 129*  K 4.6 4.2 4.4  CL 96* 93* 91*  CO2 28 26 24   GLUCOSE 98 91 99  BUN 32* 36* 50*  CREATININE 6.80* 6.73* 7.92*  CALCIUM 8.6* 8.5* 8.4*  PHOS  --  3.7 3.6   CBC Recent Labs  Lab 08/12/18 0323 08/14/18 1850 08/17/18 1357  WBC 9.2 7.4 5.2  HGB 8.1* 8.2* 7.9*  HCT  26.8* 26.1* 25.8*  MCV 94.4 93.5 92.8  PLT 249 234 207   Medications:    . amiodarone  200 mg Oral Daily  . amLODipine  10 mg Oral QHS  . aspirin EC  325 mg Oral Daily   Or  . aspirin  324 mg Per Tube Daily  . bictegravir-emtricitabine-tenofovir AF  1 tablet Oral Daily  . calcium acetate  2,001 mg Oral TID WC  . Chlorhexidine Gluconate Cloth  6 each Topical Q0600  . cloNIDine  0.3 mg Oral TID  . [START ON 08/22/2018] darbepoetin (ARANESP) injection - DIALYSIS  200 mcg Intravenous Q Mon-HD  . enoxaparin (LOVENOX) injection  30 mg Subcutaneous Daily  . feeding supplement (PRO-STAT SUGAR FREE 64)  30 mL Oral BID  . hydrALAZINE  100 mg Oral TID  . isosorbide mononitrate  20 mg Oral BID  . lacosamide  150 mg Oral BID  . multivitamin  1 tablet Oral Daily  . pantoprazole  40 mg Oral Daily  . sodium chloride flush  3 mL Intravenous Q12H

## 2018-08-18 NOTE — Progress Notes (Addendum)
[address]       [place] [address]             [phone]      21 Days Post-Op Procedure(s) (LRB): AORTIC VALVE REPLACEMENT (AVR) using Inspiris Resilia  size 23 Aortic Valve. (N/A) TRANSESOPHAGEAL ECHOCARDIOGRAM (TEE) (N/A) Subjective: Feels pretty well, asking for lactulose for constipation  Objective: Vital signs in last 24 hours: Temp:  [96.7 F (35.9 C)-98.2 F (36.8 C)] 98.1 F (36.7 C) (11/28 0753) Pulse Rate:  [60-75] 65 (11/28 0753) Cardiac Rhythm: Sinus bradycardia;Other (Comment) (11/27 2050) Resp:  [14-22] 19 (11/28 0753) BP: (118-152)/(72-95) 135/72 (11/28 0753) SpO2:  [97 %-100 %] 99 % (11/28 0753) Weight:  [72.2 kg-73.6 kg] 72.2 kg (11/28 1829)  Hemodynamic parameters for last 24 hours:    Intake/Output from previous day: 11/27 0701 - 11/28 0700 In: 1057.8 [IV Piggyback:1057.8] Out: 2000  Intake/Output this shift: No intake/output data recorded.  General appearance: alert, cooperative and no distress Heart: regular rate and rhythm and soft systolic murmur Lungs: dim in bases, L>R Abdomen: soft, nontender Extremities: no edema Wound: incis healing well  Lab Results: Recent Labs    08/17/18 1357  WBC 5.2  HGB 7.9*  HCT 25.8*  PLT 207   BMET:  Recent Labs    08/17/18 1357  NA 129*  K 4.4  CL 91*  CO2 24  GLUCOSE 99  BUN 50*  CREATININE 7.92*  CALCIUM 8.4*    PT/INR: No results for input(s): LABPROT, INR in the last 72 hours. ABG    Component Value Date/Time   PHART 7.333 (L) 07/29/2018 0439   HCO3 20.5 07/29/2018 0439   TCO2 21 (L) 07/29/2018 1609   ACIDBASEDEF 5.0 (H) 07/29/2018 0439   O2SAT 92.0 07/29/2018 0439   CBG (last 3)  No results for input(s): GLUCAP in the last 72 hours.  Meds Scheduled Meds: . amiodarone  200 mg Oral Daily  . amLODipine  10 mg Oral QHS  . aspirin EC  325 mg Oral Daily   Or  . aspirin  324 mg Per Tube Daily  . bictegravir-emtricitabine-tenofovir AF  1 tablet Oral  Daily  . calcium acetate  2,001 mg Oral TID WC  . Chlorhexidine Gluconate Cloth  6 each Topical Q0600  . cloNIDine  0.3 mg Oral TID  . [START ON 08/22/2018] darbepoetin (ARANESP) injection - DIALYSIS  200 mcg Intravenous Q Mon-HD  . enoxaparin (LOVENOX) injection  30 mg Subcutaneous Daily  . feeding supplement (PRO-STAT SUGAR FREE 64)  30 mL Oral BID  . hydrALAZINE  100 mg Oral TID  . isosorbide mononitrate  20 mg Oral BID  . lacosamide  150 mg Oral BID  . multivitamin  1 tablet Oral Daily  . pantoprazole  40 mg Oral Daily  . sodium chloride flush  3 mL Intravenous Q12H   Continuous Infusions: . sodium chloride    . sodium chloride    . sodium chloride    . ferric gluconate (FERRLECIT/NULECIT) IV 125 mg (08/17/18 1717)   PRN Meds:.sodium chloride, sodium chloride, sodium chloride, acetaminophen, albuterol, ALPRAZolam, hydrALAZINE, HYDROmorphone, lidocaine, lidocaine (PF), lidocaine-prilocaine, metoprolol tartrate, ondansetron (ZOFRAN) IV, pentafluoroprop-tetrafluoroeth, polyethylene glycol, sodium chloride flush, traMADol, traZODone  Xrays No results found.  Assessment/Plan: S/P Procedure(s) (LRB): AORTIC VALVE REPLACEMENT (AVR) using Inspiris Resilia  size 23 Aortic Valve. (N/A) TRANSESOPHAGEAL ECHOCARDIOGRAM (TEE) (N/A)  1 conts to do well 2 hemodyn stable, sinus rhythm, sinus brady with some PAC's 3 sats  good on RA 4 nephrology managing dialysis 5 SW assisting with placement 6 no new labs 7 will order lactulose as requested  LOS: 21 days    John Giovanni PA-C 08/18/2018 Pager [phone]  patient examined and medical record reviewed,agree with above note. Delman Goshorn Trigt III 08/18/2018

## 2018-08-18 NOTE — Progress Notes (Signed)
Patient's HR continues to fluctuate into the 40's and rebounds to the 50-60's. Patient remains asymptomatic and VSS. Will continue to monitor

## 2018-08-19 MED ORDER — CHLORHEXIDINE GLUCONATE CLOTH 2 % EX PADS: 6.0000 | MEDICATED_PAD | Freq: Every day | CUTANEOUS | Status: DC

## 2018-08-19 NOTE — Progress Notes (Signed)
Patient ID: Jeffrey [name], male   DOB: 14-Apr-1956, 62 y.o.   MRN: [id number] [facility] Progress Note   Assessment/ Plan:   1. Severe aortic insufficiency status post aortic valve replacement 07/28/2018: Working towards SNF placement for rehab upon DC.   2. ESRD: pt usually on home hemodialysis 4 days per week, in the hospital we have him on 4hrs 3x per week. Has had 2 HD sessions this week, next HD Sat. Spoke w/ Freda Munro in HD about placement in a [facility] unit, she is looking into it today.  Upon dc pt cannot be placed at a local HD unit in [place], Education officer, museum is aware. 3. Anemia of CKD: got darbe 200 ug yest 11/24. Hb 8-9 range here.  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 5. Nutrition: Continue current diet/nutritional supplementation. 6. Hypertension: Blood pressure remains up. Dry wt 71 kg per pt. 7. HIV - on usual meds 8. Hep B+ carrier - HD in isolation room upstairs  Kelly Splinter MD [employer] pgr [phone]   08/19/2018, 2:30 PM    Subjective:   No c/o today  Dialysis: Home hemodialysis 4 days / week  71kg  L arm AVF   Heparin 300 u (3 hundred units)   Objective:   BP 120/75 (BP Location: Right Arm)   Pulse 67   Temp 98.3 F (36.8 C) (Oral)   Resp 20   Ht 6' (1.829 m)   Wt 72.8 kg   SpO2 97%   BMI 21.75 kg/m   Physical Exam: KPT:WSFKCLE comfortably in bed,  CVS: Pulse regular rhythm and normal rate, heart sounds S1 and S2 normal Resp: Clear to auscultation, no rales/rhonchi Abd: Soft, flat, nontender Ext: No edema over lower extremities, left RCF with palpable thrill/bruit.  Labs: BMET Recent Labs  Lab 08/14/18 1850 08/17/18 1357  NA 130* 129*  K 4.2 4.4  CL 93* 91*  CO2 26 24  GLUCOSE 91 99  BUN 36* 50*  CREATININE 6.73* 7.92*  CALCIUM 8.5* 8.4*  PHOS 3.7 3.6   CBC Recent Labs  Lab 08/14/18 1850 08/17/18 1357  WBC 7.4 5.2  HGB 8.2* 7.9*  HCT 26.1*  25.8*  MCV 93.5 92.8  PLT 234 207   Medications:    . amLODipine  10 mg Oral QHS  . aspirin EC  325 mg Oral Daily   Or  . aspirin  324 mg Per Tube Daily  . bictegravir-emtricitabine-tenofovir AF  1 tablet Oral Daily  . calcium acetate  2,001 mg Oral TID WC  . Chlorhexidine Gluconate Cloth  6 each Topical Q0600  . cloNIDine  0.3 mg Oral TID  . [START ON 08/22/2018] darbepoetin (ARANESP) injection - DIALYSIS  200 mcg Intravenous Q Mon-HD  . enoxaparin (LOVENOX) injection  30 mg Subcutaneous Daily  . feeding supplement (PRO-STAT SUGAR FREE 64)  30 mL Oral BID  . hydrALAZINE  100 mg Oral TID  . isosorbide mononitrate  20 mg Oral BID  . lacosamide  150 mg Oral BID  . multivitamin  1 tablet Oral Daily  . pantoprazole  40 mg Oral Daily  . sodium chloride flush  3 mL Intravenous Q12H

## 2018-08-19 NOTE — Plan of Care (Signed)
  Problem: Health Behavior/Discharge Planning: Goal: Ability to manage health-related needs will improve Outcome: Progressing   Problem: Clinical Measurements: Goal: Ability to maintain clinical measurements within normal limits will improve Outcome: Progressing Goal: Respiratory complications will improve Outcome: Progressing

## 2018-08-19 NOTE — Progress Notes (Signed)
PT Cancellation Note  Patient Details Name: Jeffrey [name] MRN: [id number] DOB: December 19, 1955   Cancelled Treatment:    Reason Eval/Treat Not Completed: Other (comment). Patient refusing therapy session x 2 attempts. Initially pt requested PT come back in the afternoon. Upon return in the afternoon, pt continued to refuse secondary to "not feeling up for it," despite max encouragement.   Ellamae Sia, PT, DPT Acute Rehabilitation Services Pager [phone] Office [phone]   [name] 08/19/2018, 2:41 PM

## 2018-08-19 NOTE — Progress Notes (Addendum)
      [address]       [place] [address]             [phone]      22 Days Post-Op Procedure(s) (LRB): AORTIC VALVE REPLACEMENT (AVR) using Inspiris Resilia  size 23 Aortic Valve. (N/A) TRANSESOPHAGEAL ECHOCARDIOGRAM (TEE) (N/A)   Subjective:  Patient states he didn't sleep well last night.  He asked if he got his sleeping medications last night.  He got xanax but not trazodone.  He was able to move his bowels after lactulose yesterday  Objective: Vital signs in last 24 hours: Temp:  [97.9 F (36.6 C)-98.3 F (36.8 C)] 98.3 F (36.8 C) (11/29 0426) Pulse Rate:  [60-76] 76 (11/29 0426) Cardiac Rhythm: Normal sinus rhythm (11/29 0426) Resp:  [14-21] 21 (11/29 0426) BP: (146-160)/(70-90) 154/83 (11/29 0426) SpO2:  [97 %-100 %] 98 % (11/29 0426) Weight:  [72.8 kg] 72.8 kg (11/29 0426)  Intake/Output from previous day: 11/28 0701 - 11/29 0700 In: 1080 [P.O.:1080] Out: -   General appearance: alert, cooperative and no distress Heart: regular rate and rhythm Lungs: clear to auscultation bilaterally Abdomen: soft, non-tender; bowel sounds normal; no masses,  no organomegaly Extremities: edema none Wound: clean and dry  Lab Results: Recent Labs    08/17/18 1357  WBC 5.2  HGB 7.9*  HCT 25.8*  PLT 207   BMET:  Recent Labs    08/17/18 1357  NA 129*  K 4.4  CL 91*  CO2 24  GLUCOSE 99  BUN 50*  CREATININE 7.92*  CALCIUM 8.4*    PT/INR: No results for input(s): LABPROT, INR in the last 72 hours. ABG    Component Value Date/Time   PHART 7.333 (L) 07/29/2018 0439   HCO3 20.5 07/29/2018 0439   TCO2 21 (L) 07/29/2018 1609   ACIDBASEDEF 5.0 (H) 07/29/2018 0439   O2SAT 92.0 07/29/2018 0439   CBG (last 3)  No results for input(s): GLUCAP in the last 72 hours.  Assessment/Plan: S/P Procedure(s) (LRB): AORTIC VALVE REPLACEMENT (AVR) using Inspiris Resilia  size 23 Aortic Valve. (N/A) TRANSESOPHAGEAL ECHOCARDIOGRAM (TEE) (N/A)  1. CV-  Sinus Bradycardia, 1st degree AV Block- will stop Amiodarone, continue Norvasc, Catapres, Hydralazine, Imdur 2. Pulm- no acute issues, continue IS 3. Renal- ESRD, on dialysis 4. GI- constipation resolved with lactulose 5. Dispo- patient with bradycardia and 1st degree AV block, will d/c Amiodarone and monitor, awaiting placement to SNF, has been difficult due to dialysis, continue current care  LOS: 22 days    Erin Barrett 08/19/2018    I have seen and examined the patient and agree with the assessment and plan as outlined.  Tentative plans for d/c to [facility] after dialysis tomorrow  Rexene Alberts, MD 08/19/2018 3:48 PM

## 2018-08-19 NOTE — Progress Notes (Signed)
Jeffrey [name] in inpatient HD assisted with clip to [facility] dialysis center. Patient will go to [facility] [address], starting 08/23/18. Chair time 11:40 am TTS. Patient should arrive at 11 am on first treatment day.   Patient should be able to transition to [facility] tomorrow after dialysis and start outpatient dialysis on 12/3. Previous CSW note indicates that SNF can transport patient to [place] [facility] on TTS.   Left message for [facility] admissions and sent new outpatient dialysis information. Will need to confirm tomorrow that SNF ready to accept patient, as admissions did not answer today.  Updated patient's brother by phone. CSW to follow and support.  Estanislado Emms, [place]

## 2018-08-20 DIAGNOSIS — R569 Unspecified convulsions: Secondary | ICD-10-CM | POA: Diagnosis not present

## 2018-08-20 DIAGNOSIS — G4709 Other insomnia: Secondary | ICD-10-CM | POA: Diagnosis not present

## 2018-08-20 DIAGNOSIS — D649 Anemia, unspecified: Secondary | ICD-10-CM | POA: Diagnosis not present

## 2018-08-20 DIAGNOSIS — R41841 Cognitive communication deficit: Secondary | ICD-10-CM | POA: Diagnosis not present

## 2018-08-20 DIAGNOSIS — J45909 Unspecified asthma, uncomplicated: Secondary | ICD-10-CM | POA: Diagnosis not present

## 2018-08-20 DIAGNOSIS — R2689 Other abnormalities of gait and mobility: Secondary | ICD-10-CM | POA: Diagnosis not present

## 2018-08-20 DIAGNOSIS — G4700 Insomnia, unspecified: Secondary | ICD-10-CM | POA: Diagnosis not present

## 2018-08-20 DIAGNOSIS — M6281 Muscle weakness (generalized): Secondary | ICD-10-CM | POA: Diagnosis not present

## 2018-08-20 DIAGNOSIS — R5381 Other malaise: Secondary | ICD-10-CM | POA: Diagnosis not present

## 2018-08-20 DIAGNOSIS — N186 End stage renal disease: Secondary | ICD-10-CM | POA: Diagnosis not present

## 2018-08-20 DIAGNOSIS — D631 Anemia in chronic kidney disease: Secondary | ICD-10-CM | POA: Diagnosis not present

## 2018-08-20 DIAGNOSIS — G4733 Obstructive sleep apnea (adult) (pediatric): Secondary | ICD-10-CM | POA: Diagnosis not present

## 2018-08-20 DIAGNOSIS — D696 Thrombocytopenia, unspecified: Secondary | ICD-10-CM | POA: Diagnosis not present

## 2018-08-20 DIAGNOSIS — I120 Hypertensive chronic kidney disease with stage 5 chronic kidney disease or end stage renal disease: Secondary | ICD-10-CM | POA: Diagnosis not present

## 2018-08-20 DIAGNOSIS — E1122 Type 2 diabetes mellitus with diabetic chronic kidney disease: Secondary | ICD-10-CM | POA: Diagnosis not present

## 2018-08-20 DIAGNOSIS — J449 Chronic obstructive pulmonary disease, unspecified: Secondary | ICD-10-CM | POA: Diagnosis not present

## 2018-08-20 DIAGNOSIS — I10 Essential (primary) hypertension: Secondary | ICD-10-CM | POA: Diagnosis not present

## 2018-08-20 DIAGNOSIS — Z8719 Personal history of other diseases of the digestive system: Secondary | ICD-10-CM | POA: Diagnosis not present

## 2018-08-20 DIAGNOSIS — Z48812 Encounter for surgical aftercare following surgery on the circulatory system: Secondary | ICD-10-CM | POA: Diagnosis not present

## 2018-08-20 DIAGNOSIS — E039 Hypothyroidism, unspecified: Secondary | ICD-10-CM | POA: Diagnosis not present

## 2018-08-20 DIAGNOSIS — I351 Nonrheumatic aortic (valve) insufficiency: Secondary | ICD-10-CM | POA: Diagnosis not present

## 2018-08-20 DIAGNOSIS — K5909 Other constipation: Secondary | ICD-10-CM | POA: Diagnosis not present

## 2018-08-20 DIAGNOSIS — G40909 Epilepsy, unspecified, not intractable, without status epilepticus: Secondary | ICD-10-CM | POA: Diagnosis not present

## 2018-08-20 DIAGNOSIS — N2581 Secondary hyperparathyroidism of renal origin: Secondary | ICD-10-CM | POA: Diagnosis not present

## 2018-08-20 DIAGNOSIS — B181 Chronic viral hepatitis B without delta-agent: Secondary | ICD-10-CM | POA: Diagnosis not present

## 2018-08-20 DIAGNOSIS — Z992 Dependence on renal dialysis: Secondary | ICD-10-CM | POA: Diagnosis not present

## 2018-08-20 DIAGNOSIS — I2510 Atherosclerotic heart disease of native coronary artery without angina pectoris: Secondary | ICD-10-CM | POA: Diagnosis not present

## 2018-08-20 DIAGNOSIS — B1910 Unspecified viral hepatitis B without hepatic coma: Secondary | ICD-10-CM | POA: Diagnosis not present

## 2018-08-20 DIAGNOSIS — B20 Human immunodeficiency virus [HIV] disease: Secondary | ICD-10-CM | POA: Diagnosis not present

## 2018-08-20 DIAGNOSIS — I739 Peripheral vascular disease, unspecified: Secondary | ICD-10-CM | POA: Diagnosis not present

## 2018-08-20 DIAGNOSIS — M1990 Unspecified osteoarthritis, unspecified site: Secondary | ICD-10-CM | POA: Diagnosis not present

## 2018-08-20 DIAGNOSIS — F419 Anxiety disorder, unspecified: Secondary | ICD-10-CM | POA: Diagnosis not present

## 2018-08-20 LAB — CBC
HEMATOCRIT: 23.6 % — AB (ref 39.0–52.0)
Hemoglobin: 7.2 g/dL — ABNORMAL LOW (ref 13.0–17.0)
MCH: 28.7 pg (ref 26.0–34.0)
MCHC: 30.5 g/dL (ref 30.0–36.0)
MCV: 94 fL (ref 80.0–100.0)
Platelets: 212 10*3/uL (ref 150–400)
RBC: 2.51 MIL/uL — ABNORMAL LOW (ref 4.22–5.81)
RDW: 15.7 % — ABNORMAL HIGH (ref 11.5–15.5)
WBC: 5.1 10*3/uL (ref 4.0–10.5)
nRBC: 0 % (ref 0.0–0.2)

## 2018-08-20 LAB — RENAL FUNCTION PANEL
ANION GAP: 14 (ref 5–15)
Albumin: 2.4 g/dL — ABNORMAL LOW (ref 3.5–5.0)
BUN: 46 mg/dL — ABNORMAL HIGH (ref 8–23)
CALCIUM: 8.3 mg/dL — AB (ref 8.9–10.3)
CHLORIDE: 91 mmol/L — AB (ref 98–111)
CO2: 25 mmol/L (ref 22–32)
Creatinine, Ser: 7.87 mg/dL — ABNORMAL HIGH (ref 0.61–1.24)
GFR calc non Af Amer: 7 mL/min — ABNORMAL LOW (ref >60)
GFR, EST AFRICAN AMERICAN: 8 mL/min — AB (ref >60)
GLUCOSE: 102 mg/dL — AB (ref 70–99)
Phosphorus: 3.5 mg/dL (ref 2.5–4.6)
Potassium: 4.8 mmol/L (ref 3.5–5.1)
Sodium: 130 mmol/L — ABNORMAL LOW (ref 135–145)

## 2018-08-20 LAB — PREPARE RBC (CROSSMATCH)

## 2018-08-20 MED ORDER — SODIUM CHLORIDE 0.9 % IV SOLN: 125.0000 mg | INTRAVENOUS | Status: DC

## 2018-08-20 MED ORDER — SODIUM CHLORIDE 0.9% IV SOLUTION: Freq: Once | INTRAVENOUS | Status: DC

## 2018-08-20 NOTE — Progress Notes (Addendum)
Patient refused ambulation as well as transfer to the chair this shift after returning from HD. Report called to [facility] room 115. Patient belongings packed and taken by brother and patient left unit in stable condition via PTAR with AVS. Patient and family deny questions.

## 2018-08-20 NOTE — Progress Notes (Signed)
Patient ID: CORAL SOLER, male   DOB: [birth year], 62 y.o.   MRN: [id number] [facility] Progress Note  Subjective: ready to go, has bed at [facility] and transport set up from SNF to HD tiw.             Assessment/ Plan:   1. Severe aortic insufficiency status post aortic valve replacement 07/28/2018: pt has SNF setup , for dc after HD today.  Will be doing OP HD in [place] at a [facility] unit there.  Will be transported by SNF to and from HD.   2. ESRD: Anemia of CKD: got darbe 200 ug yest 11/24. Hb 8-9 range here.  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 5. Nutrition: Continue current diet/nutritional supplementation. 6. Hypertension: Blood pressure remains up. Dry wt 71 kg per pt. 7. HIV - on usual meds 8. Hep B+ carrier - HD in isolation room upstairs 9. Dispo - ready to go today after HD, to SNF  Kelly Splinter MD [employer] pgr [phone]   08/20/2018, 12:26 PM    Subjective:   No c/o today  Dialysis: Home hemodialysis 4 days / week  71kg  L arm AVF   Heparin 300 u (3 hundred units)   Objective:   BP (!) 156/96   Pulse 69   Temp 98.7 F (37.1 C)   Resp 17   Ht 6' (1.829 m)   Wt 57.7 kg Comment: Refused standing wt.  SpO2 100%   BMI 17.25 kg/m   Physical Exam: QQP:[id number] comfortably in bed,  CVS: Pulse regular rhythm and normal rate, heart sounds S1 and S2 normal Resp: Clear to auscultation, no rales/rhonchi Abd: Soft, flat, nontender Ext: No edema over lower extremities, left RCF with palpable thrill/bruit.  Labs: BMET Recent Labs  Lab 08/14/18 1850 08/17/18 1357 08/20/18 0619  NA 130* 129* 130*  K 4.2 4.4 4.8  CL 93* 91* 91*  CO2 26 24 25   GLUCOSE 91 99 102*  BUN 36* 50* 46*  CREATININE 6.73* 7.92* 7.87*  CALCIUM 8.5* 8.4* 8.3*  PHOS 3.7 3.6 3.5   CBC Recent Labs  Lab 08/14/18 1850 08/17/18 1357 08/20/18 0619  WBC 7.4 5.2 5.1  HGB 8.2* 7.9* 7.2*   HCT 26.1* 25.8* 23.6*  MCV 93.5 92.8 94.0  PLT 234 207 212   Medications:    . sodium chloride   Intravenous Once  . amLODipine  10 mg Oral QHS  . aspirin EC  325 mg Oral Daily   Or  . aspirin  324 mg Per Tube Daily  . bictegravir-emtricitabine-tenofovir AF  1 tablet Oral Daily  . calcium acetate  2,001 mg Oral TID WC  . Chlorhexidine Gluconate Cloth  6 each Topical Q0600  . cloNIDine  0.3 mg Oral TID  . [START ON 08/22/2018] darbepoetin (ARANESP) injection - DIALYSIS  200 mcg Intravenous Q Mon-HD  . enoxaparin (LOVENOX) injection  30 mg Subcutaneous Daily  . feeding supplement (PRO-STAT SUGAR FREE 64)  30 mL Oral BID  . hydrALAZINE  100 mg Oral TID  . isosorbide mononitrate  20 mg Oral BID  . lacosamide  150 mg Oral BID  . multivitamin  1 tablet Oral Daily  . pantoprazole  40 mg Oral Daily  . sodium chloride flush  3 mL Intravenous Q12H

## 2018-08-20 NOTE — Progress Notes (Addendum)
[address]       [place] [address]             [phone]      23 Days Post-Op Procedure(s) (LRB): AORTIC VALVE REPLACEMENT (AVR) using Inspiris Resilia  size 23 Aortic Valve. (N/A) TRANSESOPHAGEAL ECHOCARDIOGRAM (TEE) (N/A) Subjective: Feels ok, some chest soreness at times  Objective: Vital signs in last 24 hours: Temp:  [97.8 F (36.6 C)-98.7 F (37.1 C)] 98.7 F (37.1 C) (11/30 1220) Pulse Rate:  [48-81] 71 (11/30 1220) Cardiac Rhythm: Sinus bradycardia;Heart block (11/30 0740) Resp:  [17-22] 18 (11/30 1220) BP: (133-160)/(72-96) 141/93 (11/30 1220) SpO2:  [98 %-100 %] 100 % (11/30 1220) Weight:  [55.7 kg-68.5 kg] 55.7 kg (11/30 1220)  Hemodynamic parameters for last 24 hours:    Intake/Output from previous day: No intake/output data recorded. Intake/Output this shift: Total I/O In: 315 [Blood:315] Out: 2000 [Other:2000]  General appearance: no distress Heart: regular rate and rhythm Lungs: clear to auscultation bilaterally Abdomen: soft, nontender or distended Extremities: no edema Wound: incis healing well  Lab Results: Recent Labs    08/17/18 1357 08/20/18 0619  WBC 5.2 5.1  HGB 7.9* 7.2*  HCT 25.8* 23.6*  PLT 207 212   BMET:  Recent Labs    08/17/18 1357 08/20/18 0619  NA 129* 130*  K 4.4 4.8  CL 91* 91*  CO2 24 25  GLUCOSE 99 102*  BUN 50* 46*  CREATININE 7.92* 7.87*  CALCIUM 8.4* 8.3*    PT/INR: No results for input(s): LABPROT, INR in the last 72 hours. ABG    Component Value Date/Time   PHART 7.333 (L) 07/29/2018 0439   HCO3 20.5 07/29/2018 0439   TCO2 21 (L) 07/29/2018 1609   ACIDBASEDEF 5.0 (H) 07/29/2018 0439   O2SAT 92.0 07/29/2018 0439   CBG (last 3)  No results for input(s): GLUCAP in the last 72 hours.  Meds Scheduled Meds: . sodium chloride   Intravenous Once  . amLODipine  10 mg Oral QHS  . aspirin EC  325 mg Oral Daily   Or  . aspirin  324 mg Per Tube Daily  .  bictegravir-emtricitabine-tenofovir AF  1 tablet Oral Daily  . calcium acetate  2,001 mg Oral TID WC  . Chlorhexidine Gluconate Cloth  6 each Topical Q0600  . cloNIDine  0.3 mg Oral TID  . [START ON 08/22/2018] darbepoetin (ARANESP) injection - DIALYSIS  200 mcg Intravenous Q Mon-HD  . enoxaparin (LOVENOX) injection  30 mg Subcutaneous Daily  . feeding supplement (PRO-STAT SUGAR FREE 64)  30 mL Oral BID  . hydrALAZINE  100 mg Oral TID  . isosorbide mononitrate  20 mg Oral BID  . lacosamide  150 mg Oral BID  . multivitamin  1 tablet Oral Daily  . pantoprazole  40 mg Oral Daily  . sodium chloride flush  3 mL Intravenous Q12H   Continuous Infusions: . sodium chloride    . sodium chloride    . sodium chloride    . ferric gluconate (FERRLECIT/NULECIT) IV 125 mg (08/17/18 1717)   PRN Meds:.sodium chloride, sodium chloride, sodium chloride, acetaminophen, albuterol, ALPRAZolam, hydrALAZINE, HYDROmorphone, lactulose, lidocaine, lidocaine (PF), lidocaine-prilocaine, metoprolol tartrate, ondansetron (ZOFRAN) IV, pentafluoroprop-tetrafluoroeth, polyethylene glycol, sodium chloride flush, traMADol, traZODone  Xrays No results found.  Assessment/Plan: S/P Procedure(s) (LRB): AORTIC VALVE REPLACEMENT (AVR) using Inspiris Resilia  size 23 Aortic Valve. (N/A) TRANSESOPHAGEAL ECHOCARDIOGRAM (TEE) (N/A)  1 remains stable on current RX/TX, nephrology managing dialysis 2  some HTN- cont meds 3 sats good on RA 4 TX to SNF after dialysis if bed available- SW managing disposition  LOS: 23 days    Jeffrey [name] 08/20/2018  Patient tolerated HD today Continue current care in hospital until skilled nursing facility transfer patient examined and medical record reviewed,agree with above note. Hagop Mccollam Trigt III 08/20/2018

## 2018-08-20 NOTE — Progress Notes (Signed)
Ed completed with pt and brother. Voiced understanding. Will refer to [facility]. Did not discuss diet, will defer to his RD at HD. [place] CES, ACSM 1:32 PM 08/20/2018

## 2018-08-20 NOTE — Progress Notes (Signed)
Patient returned to unit from HD. Vitals stable, no complaints and brother at bedside.

## 2018-08-20 NOTE — Progress Notes (Signed)
Clinical Social Worker facilitated patient discharge including contacting patient family and facility to confirm patient discharge plans.  Clinical information faxed to facility and family agreeable with plan.  CSW arranged ambulance transport via [facility] to [facility].  RN to call for report prior to discharge Room [phone].  Clinical Social Worker will sign off for now as social work intervention is no longer needed. Please consult [name] again if new need arises.  Mahamad Emaree Chiu LCSW [phone]

## 2018-08-20 NOTE — Plan of Care (Signed)
  Problem: Health Behavior/Discharge Planning: Goal: Ability to manage health-related needs will improve Outcome: Adequate for Discharge   Problem: Clinical Measurements: Goal: Ability to maintain clinical measurements within normal limits will improve Outcome: Adequate for Discharge Goal: Will remain free from infection Outcome: Adequate for Discharge Goal: Diagnostic test results will improve Outcome: Adequate for Discharge Goal: Respiratory complications will improve Outcome: Adequate for Discharge Goal: Cardiovascular complication will be avoided Outcome: Adequate for Discharge   Problem: Activity: Goal: Risk for activity intolerance will decrease Outcome: Adequate for Discharge   Problem: Safety: Goal: Ability to remain free from injury will improve Outcome: Adequate for Discharge   Problem: Skin Integrity: Goal: Risk for impaired skin integrity will decrease Outcome: Adequate for Discharge   Problem: Education: Goal: Will demonstrate proper wound care and an understanding of methods to prevent future damage Outcome: Adequate for Discharge Goal: Knowledge of disease or condition will improve Outcome: Adequate for Discharge Goal: Knowledge of the prescribed therapeutic regimen will improve Outcome: Adequate for Discharge   Problem: Activity: Goal: Risk for activity intolerance will decrease Outcome: Adequate for Discharge   Problem: Cardiac: Goal: Will achieve and/or maintain hemodynamic stability Outcome: Adequate for Discharge   Problem: Clinical Measurements: Goal: Postoperative complications will be avoided or minimized Outcome: Adequate for Discharge   Problem: Respiratory: Goal: Respiratory status will improve Outcome: Adequate for Discharge   Problem: Skin Integrity: Goal: Wound healing without signs and symptoms of infection Outcome: Adequate for Discharge Goal: Risk for impaired skin integrity will decrease Outcome: Adequate for Discharge    Problem: Fluid Volume: Goal: Compliance with measures to maintain balanced fluid volume will improve Outcome: Adequate for Discharge   Problem: Clinical Measurements: Goal: Complications related to the disease process, condition or treatment will be avoided or minimized Outcome: Adequate for Discharge

## 2018-08-22 ENCOUNTER — Non-Acute Institutional Stay (SKILLED_NURSING_FACILITY): Payer: Medicare Other | Admitting: Adult Health

## 2018-08-22 ENCOUNTER — Encounter: Payer: Self-pay | Admitting: Adult Health

## 2018-08-22 DIAGNOSIS — B20 Human immunodeficiency virus [HIV] disease: Secondary | ICD-10-CM | POA: Diagnosis not present

## 2018-08-22 DIAGNOSIS — F419 Anxiety disorder, unspecified: Secondary | ICD-10-CM | POA: Diagnosis not present

## 2018-08-22 DIAGNOSIS — N186 End stage renal disease: Secondary | ICD-10-CM

## 2018-08-22 DIAGNOSIS — Z992 Dependence on renal dialysis: Secondary | ICD-10-CM

## 2018-08-22 DIAGNOSIS — R569 Unspecified convulsions: Secondary | ICD-10-CM | POA: Diagnosis not present

## 2018-08-22 DIAGNOSIS — I351 Nonrheumatic aortic (valve) insufficiency: Secondary | ICD-10-CM

## 2018-08-22 DIAGNOSIS — G4700 Insomnia, unspecified: Secondary | ICD-10-CM

## 2018-08-22 DIAGNOSIS — R5381 Other malaise: Secondary | ICD-10-CM

## 2018-08-22 DIAGNOSIS — I10 Essential (primary) hypertension: Secondary | ICD-10-CM

## 2018-08-22 LAB — TYPE AND SCREEN
ABO/RH(D): O NEG
ANTIBODY SCREEN: NEGATIVE
Unit division: 0

## 2018-08-22 LAB — BPAM RBC
Blood Product Expiration Date: 201912042359
ISSUE DATE / TIME: 201911301143
Unit Type and Rh: 9500

## 2018-08-22 NOTE — Progress Notes (Signed)
Location:  [place] Room Number: 528-U Place of Service:  SNF (31) Provider:  Durenda Age, NP  Patient Care Team: Velna Ochs, MD as PCP - General (Internal Medicine) Campbell Riches, MD as PCP - Infectious Diseases (Infectious Diseases) Sherren Mocha, MD as PCP - Cardiology (Cardiology) Delice Bison, DO as Resident (Internal Medicine)  Extended Emergency Contact Information Primary Emergency Contact: [name] Address: [address]          [place], [place] [address] Johnnette Litter of [place] Phone: [phone] Mobile Phone: [phone] Relation: Brother  Code Status:  Full Code  Goals of care: Advanced Directive information Advanced Directives 08/08/2018  Does Patient Have a Medical Advance Directive? Yes  Type of Advance Directive [place]  Does patient want to make changes to medical advance directive? No - Patient declined  Copy of [place] in Chart? No - copy requested  Would patient like information on creating a medical advance directive? -  Pre-existing out of facility DNR order (yellow form or pink MOST form) -     Chief Complaint  Patient presents with  . Acute Visit    Hospital followup, status post admission at [facility] 11/7-11/30/19 for severe aortic insufficiency with bioprosthetic aortic valve replacement    HPI:  Pt is a 62 y.o. male seen today for medical management of chronic diseases.  He was admitted to [place] on 08/20/18 for short-term rehabilitation following an admission at [facility] 11/7-11/30/19 for severe aortic valve insufficiency, status post bioprosthetic aortic valve replacement on 07/28/18. He remained in end-stage renal disease and was hyperkalemic and was recommended to continue dialysis. Pacing wires were discontinued on 08/09/18 without any issues. BB were discontinued due to bradycardia.He was diagnosed to have aortic  insufficiency and was referred for surgery on December 2017. He did not want to pursue surgery at that point. A repeat echo showed only moderate aortic insufficiency.  He also had a catheterization which showed a 35 to 40% left main stenosis.  It was elected to be observed at that point.  He was having shortness of breath over the summer and was hospitalized last August with methicillin-resistant staph epidermidis bacteremia.  Transesophageal echocardiography showed a small vegetation and severe aortic insufficiency.  He was treated with vancomycin.  He was still getting short of breath with mild exertion after completion of antibiotics so surgery was elected.    Past Medical History:  Diagnosis Date  . Anemia   . Anxiety   . Aortic valve stenosis   . Arthritis   . Asthma    per pt hx  . Dyspnea   . End stage renal disease on home HD 07/10/2011   Started HD in September 2012 at [facility] with a tunneled HD catheter, now on home HD with NxtStage. Dialyzing through AVF L lower arm with buttonhole technique as of mid 2014. His brother does the HD treatments at home.  They are roommates for 23 years.  The brother works 3rd shift and gets off about 8am and then puts Mr Jeffrey [name] on HD in the morning after getting home. Most of the time he does HD about 4 times a week, for about 4 hours per treatment. Cause of ESRD was HTN according to patient. He says he let his health go and ending up with complications, and that he didn't like seeing doctors in those days.  He says he was diagnosed with severe HTN when he lived in [place] in his 35's.   . Hepatitis B carrier ([place])   . HIV infection ([place])   . Hypertension   . Hyperthyroidism    normal now  . Pneumonia several yrs ago  . Seizures ([place]) 06/02/2011   had a mild one in Sept. 2019  . Sleep apnea    to start Cpap soon  . Thrombocytopenia ([place])    Past Surgical History:  Procedure Laterality Date  . AORTIC VALVE REPLACEMENT N/A 07/28/2018   Procedure:  AORTIC VALVE REPLACEMENT (AVR) using Inspiris Resilia  size 23 Aortic Valve.;  Surgeon: [name], MD;  Location: [place];  Service: Open Heart Surgery;  Laterality: N/A;  . AV FISTULA PLACEMENT  06/02/11   Left radiocephalic AVF  . BIOPSY  02/17/2018   Procedure: BIOPSY;  Surgeon: Milus Banister, MD;  Location: [facility] ENDOSCOPY;  Service: Endoscopy;;  . COLONOSCOPY    . COLONOSCOPY WITH PROPOFOL N/A 01/27/2018   Procedure: COLONOSCOPY WITH PROPOFOL;  Surgeon: [name], MD;  Location: [facility] ENDOSCOPY;  Service: Gastroenterology;  Laterality: N/A;  . CORONARY ANGIOPLASTY    . ERCP  03/21/2018   AT [place]  . ESOPHAGOGASTRODUODENOSCOPY N/A 02/17/2018   Procedure: ESOPHAGOGASTRODUODENOSCOPY (EGD);  Surgeon: Milus Banister, MD;  Location: [facility] ENDOSCOPY;  Service: Endoscopy;  Laterality: N/A;  . ESOPHAGOGASTRODUODENOSCOPY (EGD) WITH PROPOFOL N/A 01/27/2018   Procedure: ESOPHAGOGASTRODUODENOSCOPY (EGD) WITH PROPOFOL;  Surgeon: [name], MD;  Location: [facility] ENDOSCOPY;  Service: Gastroenterology;  Laterality: N/A;  . ESOPHAGOGASTRODUODENOSCOPY (EGD) WITH PROPOFOL N/A 04/03/2018   Procedure: ESOPHAGOGASTRODUODENOSCOPY (EGD) WITH PROPOFOL;  Surgeon: Clarene Essex, MD;  Location: [place];  Service: Endoscopy;  Laterality: N/A;  . EUS N/A 02/17/2018   Procedure: UPPER ENDOSCOPIC ULTRASOUND (EUS) RADIAL;  Surgeon: Milus Banister, MD;  [facility];  Service: Endoscopy;  Laterality: N/A;  . fistulaogram     x 2 last 2 years  . IR DIALY SHUNT INTRO NEEDLE/INTRACATH INITIAL W/IMG LEFT Left 04/20/2018  . IR FLUORO GUIDE CV LINE RIGHT  09/16/2017  . IR US GUIDE VASC ACCESS RIGHT  09/16/2017  . IRRIGATION AND DEBRIDEMENT KNEE Left 09/15/2017   Procedure: IRRIGATION AND DEBRIDEMENT KNEE; arthroscopic clean out;  Surgeon: [name], MD;  [facility] ORS;  Service: Orthopedics;  Laterality: Left;  . OTHER SURGICAL HISTORY     removal temporary HD catheter   . REVISON OF  ARTERIOVENOUS FISTULA Left 10/10/2015   Procedure: REVISON OF LEFT RADIOCEPHALIC ARTERIOVENOUS FISTULA;  Surgeon: Angelia Mould, MD;  Location: [place];  Service: Vascular;  Laterality: Left;  . REVISON OF ARTERIOVENOUS FISTULA Left 02/07/2016   Procedure: REPAIR OF PSEUDO-ANEUREYSM OF LEFT ARM  ARTERIOVENOUS FISTULA;  Surgeon: Angelia Mould, MD;  Location: [place];  Service: Vascular;  Laterality: Left;  . REVISON OF ARTERIOVENOUS FISTULA Left 1/54/0086   Procedure: PLICATION OF LEFT ARM RADIOCEPHALIC ARTERIOVENOUS FISTULA PSEUDOANEURYSM;  Surgeon: Angelia Mould, MD;  Location: [place];  Service: Vascular;  Laterality: Left;  . REVISON OF ARTERIOVENOUS FISTULA Left 07/04/2018   Procedure: REVISION OF ARTERIOVENOUS RADIOCEPHALIC FISTULA;  Surgeon: Angelia Mould, MD;  Location: [place];  Service: Vascular;  Laterality: Left;  . RIGHT/LEFT HEART CATH AND CORONARY ANGIOGRAPHY N/A 02/17/2017   Procedure: Right/Left Heart Cath and Coronary Angiography;  Surgeon: Sherren Mocha, MD;  Location: [place] CV LAB;  Service: Cardiovascular;  Laterality: N/A;  . RIGHT/LEFT HEART CATH AND CORONARY ANGIOGRAPHY N/A 07/25/2018   Procedure: RIGHT/LEFT HEART CATH AND CORONARY ANGIOGRAPHY;  Surgeon: Ellyn Hack,  Leonie Green, MD;  Location: [place] CV LAB;  Service: Cardiovascular;  Laterality: N/A;  . TEE WITHOUT CARDIOVERSION N/A 09/11/2016   Procedure: TRANSESOPHAGEAL ECHOCARDIOGRAM (TEE);  Surgeon: Dorothy Spark, MD;  Location: [place];  Service: Cardiovascular;  Laterality: N/A;  . TEE WITHOUT CARDIOVERSION N/A 05/05/2018   Procedure: TRANSESOPHAGEAL ECHOCARDIOGRAM (TEE);  Surgeon: Adrian Prows, MD;  Location: [place];  Service: Cardiovascular;  Laterality: N/A;  Prefer after 3:30 PM  . TEE WITHOUT CARDIOVERSION N/A 07/28/2018   Procedure: TRANSESOPHAGEAL ECHOCARDIOGRAM (TEE);  Surgeon: [name], MD;  Location: [place];  Service: Open Heart Surgery;  Laterality: N/A;     Allergies  Allergen Reactions  . Lisinopril Anaphylaxis and Shortness Of Breath    Throat swelling  . Penicillins Anaphylaxis and Other (See Comments)    Childhood allergy Has patient had a PCN reaction causing immediate rash, facial/tongue/throat swelling, SOB or lightheadedness with hypotension: Yes Has patient had a PCN reaction causing severe rash involving mucus membranes or skin necrosis: Unk Has patient had a PCN reaction that required hospitalization: Unk Has patient had a PCN reaction occurring within the last 10 years: No If all of the above answers are "NO", then may proceed with Cephalosporin use.   . Fentanyl Other (See Comments)    Lethargy, AMS  . Morphine And Related Other (See Comments)    "Not Himself"    Outpatient Encounter Medications as of 08/22/2018  Medication Sig  . acetaminophen (TYLENOL) 325 MG tablet Take 2 tablets (650 mg total) by mouth every 6 (six) hours as needed for fever, headache, mild pain or moderate pain.  Marland Kitchen albuterol (PROVENTIL) (2.5 MG/3ML) 0.083% nebulizer solution Take 2.5 mg by nebulization every 6 (six) hours as needed for wheezing or shortness of breath.  . ALPRAZolam (XANAX) 0.25 MG tablet Take 0.25 mg by mouth at bedtime as needed for anxiety.   . Amino Acids-Protein Hydrolys (FEEDING SUPPLEMENT, PRO-STAT SUGAR FREE 64,) LIQD Take 30 mLs by mouth 2 (two) times daily.  Marland Kitchen amLODipine (NORVASC) 10 MG tablet Take 10 mg by mouth daily.  Marland Kitchen aspirin EC 325 MG EC tablet Take 1 tablet (325 mg total) by mouth daily.  . bictegravir-emtricitabine-tenofovir AF (BIKTARVY) 50-200-25 MG TABS tablet Take 1 tablet by mouth daily.  . calcium acetate (PHOSLO) 667 MG capsule Take 1,334-2,001 mg by mouth See admin instructions. Take 2,001 mg three times a day with each meal and 1,334 mg with each snack  . cloNIDine (CATAPRES) 0.3 MG tablet Take 0.3 mg by mouth 3 (three) times daily.  . ferric gluconate 125 mg in sodium chloride 0.9 % 100 mL Inject 125 mg into  the vein every Monday, Wednesday, and Friday with hemodialysis.  . hydrALAZINE (APRESOLINE) 100 MG tablet TAKE 1 TABLET(100 MG) BY MOUTH THREE TIMES DAILY  . Ipratropium-Albuterol (COMBIVENT IN) Inhale 2 puffs into the lungs every 6 (six) hours as needed (as needed for shortness of breath).   . isosorbide mononitrate (ISMO,MONOKET) 20 MG tablet Take 20 mg by mouth 2 (two) times daily at 10 AM and 5 PM.  . Lacosamide (VIMPAT) 150 MG TABS Take 1 tablet by mouth 2 (two) times daily.  . multivitamin (RENA-VIT) TABS tablet Take 1 tablet by mouth daily.    Marland Kitchen omeprazole (PRILOSEC) 20 MG capsule Take 1 capsule (20 mg total) by mouth daily.  . OXYGEN Inhale 2 L/min into the lungs as needed (Hypoxia).  . traMADol (ULTRAM) 50 MG tablet Take 1-2 tablets (50-100 mg total) by mouth  every 12 (twelve) hours as needed for moderate pain.  . traZODone (DESYREL) 50 MG tablet TAKE 1 TABLET(50 MG) BY MOUTH AT BEDTIME AS NEEDED FOR SLEEP  . [DISCONTINUED] amLODipine (NORVASC) 10 MG tablet Take 1 tablet (10 mg total) by mouth daily.  . [DISCONTINUED] cloNIDine (CATAPRES) 0.3 MG tablet Take 1 tablet (0.3 mg total) by mouth 3 (three) times daily.  . [DISCONTINUED] isosorbide mononitrate (ISMO,MONOKET) 20 MG tablet Take 1 tablet (20 mg total) by mouth 2 (two) times daily.  . [DISCONTINUED] lacosamide 150 MG TABS Take 1 tablet (150 mg total) by mouth 2 (two) times daily.   Facility-Administered Encounter Medications as of 08/22/2018  Medication  . etomidate (AMIDATE) injection  . rocuronium bromide 10 mg/mL (PF) syringe    Review of Systems  GENERAL: No change in appetite, no fatigue, no weight changes, no fever, chills or weakness MOUTH and THROAT: Denies oral discomfort, gingival pain or bleeding RESPIRATORY: no cough, SOB, DOE, wheezing, hemoptysis CARDIAC: No chest pain, edema or palpitations GI: No abdominal pain, diarrhea, constipation, heart burn, nausea or vomiting PSYCHIATRIC: Denies feelings of depression  or anxiety. No report of hallucinations, insomnia, paranoia, or agitation    Immunization History  Administered Date(s) Administered  . Influenza Split 11-13-202012, 08/31/2012, 06/23/2016  . Influenza,inj,Quad PF,6+ Mos 06/13/2013, 05/14/2014, 06/26/2015, 06/16/2017  . PPD Test 07/21/2011  . Pneumococcal Polysaccharide-23 11-13-202012  . Pneumococcal-Unspecified 06/23/2016   Pertinent  Health Maintenance Due  Topic Date Due  . INFLUENZA VACCINE  04/21/2018  . COLONOSCOPY  01/28/2028   Fall Risk  06/20/2018 05/30/2018 05/09/2018 10/05/2017 09/08/2017  Falls in the past year? Yes Yes Yes No No  Number falls in past yr: 1 1 1  - -  Injury with Fall? Yes Yes Yes - -  Risk Factor Category  High Fall Risk High Fall Risk High Fall Risk - -  Risk for fall due to : History of fall(s);Impaired balance/gait;Impaired mobility - History of fall(s);Impaired balance/gait;Impaired mobility - -  Risk for fall due to: Comment - - - - -  Follow up Falls prevention discussed - - - -      Vitals:   08/22/18 0901  BP: (!) 148/76  Pulse: 74  Resp: 20  Temp: 97.6 F (36.4 C)  TempSrc: Oral  SpO2: 98%  Weight: 122 lb 12.8 oz (55.7 kg)  Height: 6' (1.829 m)   Body mass index is 16.65 kg/m.  Physical Exam  GENERAL APPEARANCE: In no acute distress.  SKIN:  Skin is warm and dry.  MOUTH and THROAT: Lips are without lesions. Oral mucosa is moist and without lesions. Tongue is normal in shape, size, and color and without lesions RESPIRATORY: Breathing is even & unlabored, BS CTAB CARDIAC: RRR, no murmur,no extra heart sounds, no edema, left forearm AV fistula GI: Abdomen soft, normal BS, no masses, no tenderness EXTREMITIES:  Able to move X 4 extremities NEUROLOGICAL: There is no tremor. Speech is clear. Alert and oriented X 3. PSYCHIATRIC:  Affect and behavior are appropriate  Labs reviewed: Recent Labs    07/28/18 1848  07/29/18 0415 07/29/18 1556  08/14/18 1850 08/17/18 1357 08/20/18 0619   NA  --    < > 137  --    < > 130* 129* 130*  K  --    < > 5.5*  --    < > 4.2 4.4 4.8  CL  --    < > 106  --    < > 93* 91* 91*  CO2  --    < > 20*  --    < > 26 24 25   GLUCOSE  --    < > 124*  --    < > 91 99 102*  BUN  --    < > 54*  --    < > 36* 50* 46*  CREATININE 9.23*   < > 9.89* 10.40*   < > 6.73* 7.92* 7.87*  CALCIUM  --    < > 8.6*  --    < > 8.5* 8.4* 8.3*  MG 1.7  --  1.6* 1.7  --   --   --   --   PHOS  --    < >  --   --    < > 3.7 3.6 3.5   < > = values in this interval not displayed.   Recent Labs    07/09/18 2149 07/10/18 0034  07/26/18 1458  08/14/18 1850 08/17/18 1357 08/20/18 0619  AST 15 19  --  12*  --   --   --   --   ALT <5 7  --  6  --   --   --   --   ALKPHOS 65 62  --  69  --   --   --   --   BILITOT 1.0 0.9  --  1.0  --   --   --   --   PROT 7.9 7.2  --  6.3*  --   --   --   --   ALBUMIN 3.6 3.4*   < > 3.0*   < > 2.5* 2.5* 2.4*   < > = values in this interval not displayed.   Recent Labs    06/04/18 1007  07/13/18 0500  07/15/18 0558  08/14/18 1850 08/17/18 1357 08/20/18 0619  WBC 7.2   < > 4.5   < > 6.4   < > 7.4 5.2 5.1  NEUTROABS 4.1  --  3.8  --  4.3  --   --   --   --   HGB 10.3*   < > 7.8*   < > 8.6*   < > 8.2* 7.9* 7.2*  HCT 33.7*   < > 26.1*   < > 29.3*   < > 26.1* 25.8* 23.6*  MCV 94.9   < > 93.9   < > 95.1   < > 93.5 92.8 94.0  PLT 148*   < > 79*   < > 145*   < > 234 207 212   < > = values in this interval not displayed.   Lab Results  Component Value Date   TSH 4.286 07/10/2018   Lab Results  Component Value Date   HGBA1C 4.2 (L) 07/26/2018   Lab Results  Component Value Date   CHOL 98 07/14/2018   HDL 35 (L) 07/14/2018   LDLCALC 49 07/14/2018   TRIG 77 07/16/2018   CHOLHDL 2.8 07/14/2018    Significant Diagnostic Results in last 30 days:  Dg Chest 2 View  Result Date: 08/05/2018 CLINICAL DATA:  Status post aortic valve replacement EXAM: CHEST - 2 VIEW COMPARISON:  08/03/2018 FINDINGS: Left jugular sheath has  been removed in the interval. Cardiac shadow is mildly enlarged but stable. Postsurgical changes are again seen. The right lung is clear. Persistent left basilar infiltrate and effusion are seen and stable in appearance given some change in patient position. No acute bony abnormality is noted.  IMPRESSION: Left lower lobe infiltrate with associated effusion stable from the previous exam. Electronically Signed   By: Inez Catalina M.D.   On: 08/05/2018 08:22   Dg Chest 2 View  Result Date: 07/27/2018 CLINICAL DATA:  Preop valve replacement. EXAM: CHEST - 2 VIEW COMPARISON:  07/14/2018 FINDINGS: Interval extubation. The central venous catheter and enteric tube have been removed. The there is stable cardiac enlargement. Moderate to large left pleural effusion is identified with atelectasis of the right lower lobe and lingula. Diffuse pulmonary vascular congestion. IMPRESSION: 1. Persistent moderate to large left pleural effusion with pulmonary vascular congestion. Electronically Signed   By: Kerby Moors M.D.   On: 07/27/2018 08:09   Dg Chest Port 1 View  Result Date: 08/10/2018 CLINICAL DATA:  Shortness of EXAM: PORTABLE CHEST 1 VIEW COMPARISON:  Five days ago FINDINGS: Left pleural effusion with opacified lower lobe, similar to prior. Cardiomegaly. Status post aortic valve replacement. Large volume right lung. No Kerley lines, air bronchogram, or pneumothorax. IMPRESSION: Unchanged left pleural effusion with opacified or obscured lower lobe. A left pleural effusion has been noted since July 2019. Electronically Signed   By: Monte Fantasia M.D.   On: 08/10/2018 15:09   Dg Chest Port 1 View  Result Date: 08/03/2018 CLINICAL DATA:  Post AVR, chest soreness EXAM: PORTABLE CHEST 1 VIEW COMPARISON:  08/02/2018 FINDINGS: Postoperative changes from aortic valve replacement. Cardiomegaly with vascular congestion and mild pulmonary edema. Probable small effusions. Bibasilar atelectasis or infiltrates, left  greater than right. No real change since prior study. No pneumothorax. IMPRESSION: Postoperative changes. Cardiomegaly with vascular congestion and mild pulmonary edema. Small effusions with bibasilar atelectasis or infiltrates, left greater than right. No pneumothorax. Electronically Signed   By: Rolm Baptise M.D.   On: 08/03/2018 08:41   Dg Chest Port 1 View  Result Date: 08/02/2018 CLINICAL DATA:  Followup aortic valve replacement. EXAM: PORTABLE CHEST 1 VIEW COMPARISON:  07/31/2018. FINDINGS: Left IJ sheath in stable position. Prior aortic valve replacement. Cardiomegaly with bilateral pulmonary infiltrates most consistent pulmonary edema. Basilar atelectasis again noted. Prominent left-sided pleural effusion. Right costophrenic angle incompletely imaged. IMPRESSION: 1.  Left IJ sheath in stable position. 2. Prior median sternotomy and aortic valve replacement. Cardiomegaly with bilateral pulmonary infiltrates consistent pulmonary edema. Moderate size left pleural effusion. 3.  Bibasilar atelectasis again noted. Electronically Signed   By: Marcello Moores  Register   On: 08/02/2018 08:39   Dg Chest Port 1 View  Result Date: 07/31/2018 CLINICAL DATA:  Followup cardiac surgery and aortic valve replacement. EXAM: PORTABLE CHEST 1 VIEW COMPARISON:  07/30/2018 and older exams. FINDINGS: Confluent opacity at the left lung base, consistent with pleural fluid and atelectasis, is stable. Mild opacity at the right lung base is also stable consistent with a small effusion with mild atelectasis. There are prominent bronchovascular markings without convincing pulmonary edema. No pneumothorax. No mediastinal widening. Left internal jugular introducer sheath is stable. IMPRESSION: 1. No significant change from the most recent prior exam allowing for differences in patient positioning and technique. No mediastinal widening, convincing pulmonary edema or pneumothorax. 2. Left greater than right lung base opacity consistent with  a combination of pleural fluid with atelectasis. Electronically Signed   By: Lajean Manes M.D.   On: 07/31/2018 08:53   Dg Chest Port 1 View  Result Date: 07/30/2018 CLINICAL DATA:  Post AVR. EXAM: PORTABLE CHEST 1 VIEW COMPARISON:  07/29/2018 FINDINGS: Left IJ sheath remains in place with tip near the junction of the internal jugular  vein to brachiocephalic vein. Lungs are somewhat hypoinflated with persistent opacification over the left mid to lower lung likely moderate size effusion with associated basilar atelectasis. Mild prominence of the perihilar markings suggesting a degree of vascular congestion. Mild stable cardiomegaly. Remainder of the exam is unchanged. IMPRESSION: Stable opacification over the left mid to lower lung likely moderate effusion with associated basilar atelectasis. Cardiomegaly with a suggestion of a stable degree of vascular congestion. Left IJ venous sheath in place. Electronically Signed   By: Marin Olp M.D.   On: 07/30/2018 08:52   Dg Chest Port 1 View  Result Date: 07/29/2018 CLINICAL DATA:  Chest tube.  Post AVR. EXAM: PORTABLE CHEST 1 VIEW COMPARISON:  07/28/2018 FINDINGS: Previous median sternotomy and AVR. Two thoracic drains over the mid and left chest unchanged. Left IJ Swan-Ganz catheter has tip over the proximally and right pulmonary artery. Lungs are adequately inflated demonstrate persistent opacification over the left mid to lower lung likely effusion with associated atelectasis. Mild hazy opacification of the perihilar markings unchanged likely mild vascular congestion. Mild stable cardiomegaly as remainder of the exam is unchanged. IMPRESSION: Stable left base opacification likely effusion with atelectasis. Mild cardiomegaly with mild vascular congestion. Tubes and lines as described. Electronically Signed   By: Marin Olp M.D.   On: 07/29/2018 08:33   Dg Chest Port 1 View  Result Date: 07/28/2018 CLINICAL DATA:  Postop chest EXAM: PORTABLE CHEST 1  VIEW COMPARISON:  Two days ago FINDINGS: Interval median sternotomy and aortic valve replacement. Unchanged cardiomegaly. Endotracheal tube with tip just below the clavicular heads. An orogastric tube at least reaches the diaphragm. Thoracic drains are present. No visible pneumothorax. There is pulmonary edema, atelectasis, and probable left pleural effusion. Swan-Ganz catheter from the left with tip at the central pulmonary artery level. IMPRESSION: 1. Pulmonary edema, atelectasis, and left pleural effusion. 2. Unremarkable hardware positioning. Electronically Signed   By: Monte Fantasia M.D.   On: 07/28/2018 13:08    Assessment/Plan  1. Severe aortic insufficiency -S/P aortic valve replacement on 07/28/2018, continue tramadol 50 mg 1 tab every 12 hours as needed for pain, isosorbide mononitrate 20 mg 1 tab twice a day, aspirin 325 mg 1 tab daily   2. Seizure ([place]) -continue Vimpat 150 mg 1 tab twice a day   3. Essential hypertension -stable, continue Norvasc 10 mg 1 tab daily, Catapres 0.3 mg 1 tab 3 times a day, hydralazine 100 mg 1 tab 3 times a day   4. ESRD on hemodialysis [facility]) -will have dialysis q. Tuesdays, Thursdays and Saturdays, calcium acetate 667 mg 2 capsules with snacks and 3 capsules 3 times a day with meals   5. HIV disease ([place]) -continue Biktarvy 50-2 100-25 mg 1 tab daily   6. Anxiety -mood this is stable, continue Xanax 0.25 mg 1 tab nightly as needed X 14 days   7. Insomnia, unspecified type -continue trazodone 50 mg 1 tab nightly as needed   8. Physical deconditioning -will have PT and OT, for therapeutic and strengthening exercises, fall precautions    Family/ staff Communication: Discussed plan of care with resident.  Labs/tests ordered:  None  Goals of care:   Short-term rehabilitation.   Durenda Age, NP [facility] [phone] (Monday-Friday 8:00 a.m. - 5:00 p.m.) [phone] (after hours)

## 2018-08-23 ENCOUNTER — Encounter: Payer: Self-pay | Admitting: Internal Medicine

## 2018-08-23 ENCOUNTER — Non-Acute Institutional Stay (SKILLED_NURSING_FACILITY): Payer: Medicare Other | Admitting: Internal Medicine

## 2018-08-23 DIAGNOSIS — N186 End stage renal disease: Secondary | ICD-10-CM | POA: Diagnosis not present

## 2018-08-23 DIAGNOSIS — J449 Chronic obstructive pulmonary disease, unspecified: Secondary | ICD-10-CM

## 2018-08-23 DIAGNOSIS — Z992 Dependence on renal dialysis: Secondary | ICD-10-CM | POA: Diagnosis not present

## 2018-08-23 DIAGNOSIS — I351 Nonrheumatic aortic (valve) insufficiency: Secondary | ICD-10-CM | POA: Diagnosis not present

## 2018-08-23 DIAGNOSIS — N2581 Secondary hyperparathyroidism of renal origin: Secondary | ICD-10-CM | POA: Diagnosis not present

## 2018-08-23 DIAGNOSIS — G4733 Obstructive sleep apnea (adult) (pediatric): Secondary | ICD-10-CM

## 2018-08-23 DIAGNOSIS — I10 Essential (primary) hypertension: Secondary | ICD-10-CM

## 2018-08-23 LAB — HEPATITIS B SURFACE ANTIGEN

## 2018-08-23 LAB — HEPATITIS B SURFACE AG, CONFIRM: HBsAg Confirmation: POSITIVE — AB

## 2018-08-23 NOTE — Assessment & Plan Note (Signed)
Continue neb treatments as needed.

## 2018-08-23 NOTE — Patient Instructions (Signed)
See assessment and plan under each diagnosis in the problem list and acutely for this visit 

## 2018-08-23 NOTE — Assessment & Plan Note (Signed)
Verify cardiothoracic follow-up appointment

## 2018-08-23 NOTE — Assessment & Plan Note (Signed)
Hemodialysis is being pursued actively; originally his appointment he was discontinued as his PPD has not been read.  Checks x-ray 11/20 was reviewed personally.  There is no sign of active TB and he has no symptoms of such.  He does have a chronic left pleural effusion.

## 2018-08-23 NOTE — Progress Notes (Signed)
NURSING HOME LOCATION:  [facility] ROOM NUMBER:  [id number]  CODE STATUS:  Full Code  PCP:  Velna Ochs, MD  [place] [id number]   This is a comprehensive admission note to [facility] performed on this date less than 30 days from date of admission. Included are preadmission medical/surgical history; reconciled medication list; family history; social history and comprehensive review of systems.  Corrections and additions to the records were documented. Comprehensive physical exam was also performed. Additionally a clinical summary was entered for each active diagnosis pertinent to this admission in the Problem List to enhance continuity of care.  HPI: Patient was hospitalized 11/7-11/30/2019 for severe symptomatic aortic valve insufficiency associated with severe anemia.  Aortic valve replacement with bioprosthetic valve was completed 07/28/2018 by Dr. Roxan Hockey. In August 2019 Jeffrey [name] was hospitalized with methicillin-resistant staph epidermidis bacteremia.  Transesophageal echocardiography revealed a small vegetation and severe aortic insufficiency.  Jeffrey [name] received vancomycin;surgery was deferred at that time until the full course of antibiotic could be completed.  Jeffrey [name] continued to have exertional dyspnea. At discharge placement in SNF was recommended for PT/OT.  Past medical and surgical history: End-stage renal disease on hemodialysis, HIV/ status post  treatment, hepatitis B, hypertension, seizures, hypothyroidism, peripheral arterial disease, thrombocytopenia, and tubulovillous adenoma of the duodenum.  Jeffrey [name] states that Jeffrey [name] has been diagnosed with sleep apnea but has not been able to obtain a CPAP machine because of his other medical comorbidities which have required aggressive treatment and intervention and multiple appointments.   Jeffrey [name] has experienced anaphylaxis with penicillin as well as lisinopril. Jeffrey [name] has had multiple coronary and peripheral vascular procedures as  well as colonoscopy.  Social history: Jeffrey [name] states Jeffrey [name] quit smoking "years ago".  Nondrinker.  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  Family history: Reviewed   Review of systems: Jeffrey [name] describes intermittent chest tightness which responds to nebulizer treatment with albuterol.  Typically Jeffrey [name] uses his nebulizer twice a week on average.  Occasionally Jeffrey [name] does have to sit up at night because of shortness of breath.  Jeffrey [name] states Jeffrey [name] has a diagnosis of COPD.  Jeffrey [name] does derive some depression at being in the SNF.  PT/OT reported that Jeffrey [name] was anxious yesterday but improved today and able to participate.  Jeffrey [name] describes bradycardia intermittently with his blood pressure medicines.  Jeffrey [name] also has some constipation. Constitutional: No fever, significant weight change, fatigue  Eyes: No redness, discharge, pain, vision change ENT/mouth: No nasal congestion, purulent discharge, earache, change in hearing, sore throat  Cardiovascular: No  palpitations,  claudication, edema  Respiratory: No cough, sputum production, hemoptysis,  Gastrointestinal: No heartburn, dysphagia, abdominal pain, nausea /vomiting, rectal bleeding, melena, change in bowels Genitourinary: Anuric w/o dysuria, hematuria, pyuria, incontinence, nocturia Musculoskeletal: No joint stiffness, joint swelling, weakness, pain Dermatologic: No rash, pruritus, change in appearance of skin Neurologic: No dizziness, headache, syncope, seizures, numbness, tingling Endocrine: No change in hair/skin/nails, excessive thirst, excessive hunger, excessive urination  Hematologic/lymphatic: No significant bruising, lymphadenopathy, abnormal bleeding Allergy/immunology: No itchy/watery eyes, significant sneezing, urticaria, angioedema  Physical exam:  Pertinent or positive findings: Hair is disheveled.  Jeffrey [name] has a beard and mustache.  Sclera are muddy.  Jeffrey [name] is edentulous.  Grade 1 systolic murmur is present at the right base.  Grade 1.5 systolic murmur is  noted at the left sternal border with an increased second heart sound.  There is a loud rumble over the right carotid.  Breath sounds are decreased, especially  at the left lower lobe posteriorly.  Dorsalis pedis pulses are decreased.  Toenails are thick and distorted.  Jeffrey [name] is diffusely weak. General appearance:  no acute distress, increased work of breathing is present.   Lymphatic: No lymphadenopathy about the head, neck, axilla. Eyes: No conjunctival inflammation or lid edema is present. There is no scleral icterus. Ears:  External ear exam shows no significant lesions or deformities.   Nose:  External nasal examination shows no deformity or inflammation. Nasal mucosa are pink and moist without lesions, exudates Oral exam: Lips and gums are healthy appearing.There is no oropharyngeal erythema or exudate. Neck:  No thyromegaly, masses, tenderness noted.    Heart:  Normal rate and regular rhythm. S1  normal without gallop,  click, rub.  Lungs:  without wheezes, rhonchi, rales, rubs. Abdomen: Bowel sounds are normal.  Abdomen is soft and nontender with no organomegaly, hernias, masses. GU: Deferred  Extremities:  No cyanosis, clubbing, edema. Neurologic exam:  Balance, Rhomberg, finger to nose testing could not be completed due to clinical state Deep tendon reflexes are equal Skin: Warm & dry w/o tenting. No significant lesions or rash.  See clinical summary under each active problem in the Problem List with associated updated therapeutic plan

## 2018-08-23 NOTE — Assessment & Plan Note (Addendum)
Notify PCP that due to other more emergent health concerns including endocarditis and aortic valve insufficiency he has not obtained the CPAP machine yet

## 2018-08-23 NOTE — Assessment & Plan Note (Signed)
BP controlled; no change in antihypertensive medications Monitor for significant bradycardia with his present regimen

## 2018-08-25 ENCOUNTER — Telehealth (HOSPITAL_COMMUNITY): Payer: Self-pay

## 2018-08-25 DIAGNOSIS — Z992 Dependence on renal dialysis: Secondary | ICD-10-CM | POA: Diagnosis not present

## 2018-08-25 DIAGNOSIS — N186 End stage renal disease: Secondary | ICD-10-CM | POA: Diagnosis not present

## 2018-08-25 DIAGNOSIS — N2581 Secondary hyperparathyroidism of renal origin: Secondary | ICD-10-CM | POA: Diagnosis not present

## 2018-08-25 NOTE — Telephone Encounter (Signed)
Pt insurance is active and benefits verified through Medicare A/B. Co-pay $0.00, DED $185.00/$185.00 met, out of pocket $0.00/$0.00 met, co-insurance 20%. No pre-authorization required. [employer]port, 08/25/18 @ 8:49AM, REF# [id number]  Will contact patient to see if he is interested in the Cardiac Rehab Program. If interested, patient will need to complete follow up appt. Once completed, patient will be contacted for scheduling upon review by the RN Navigator.

## 2018-08-27 DIAGNOSIS — N2581 Secondary hyperparathyroidism of renal origin: Secondary | ICD-10-CM | POA: Diagnosis not present

## 2018-08-27 DIAGNOSIS — N186 End stage renal disease: Secondary | ICD-10-CM | POA: Diagnosis not present

## 2018-08-27 DIAGNOSIS — Z992 Dependence on renal dialysis: Secondary | ICD-10-CM | POA: Diagnosis not present

## 2018-08-29 ENCOUNTER — Encounter: Payer: Medicare Other | Admitting: Internal Medicine

## 2018-08-30 DIAGNOSIS — N186 End stage renal disease: Secondary | ICD-10-CM | POA: Diagnosis not present

## 2018-08-30 DIAGNOSIS — Z992 Dependence on renal dialysis: Secondary | ICD-10-CM | POA: Diagnosis not present

## 2018-08-30 DIAGNOSIS — N2581 Secondary hyperparathyroidism of renal origin: Secondary | ICD-10-CM | POA: Diagnosis not present

## 2018-09-01 DIAGNOSIS — N186 End stage renal disease: Secondary | ICD-10-CM | POA: Diagnosis not present

## 2018-09-01 DIAGNOSIS — N2581 Secondary hyperparathyroidism of renal origin: Secondary | ICD-10-CM | POA: Diagnosis not present

## 2018-09-01 DIAGNOSIS — Z992 Dependence on renal dialysis: Secondary | ICD-10-CM | POA: Diagnosis not present

## 2018-09-03 DIAGNOSIS — N2581 Secondary hyperparathyroidism of renal origin: Secondary | ICD-10-CM | POA: Diagnosis not present

## 2018-09-03 DIAGNOSIS — N186 End stage renal disease: Secondary | ICD-10-CM | POA: Diagnosis not present

## 2018-09-03 DIAGNOSIS — Z992 Dependence on renal dialysis: Secondary | ICD-10-CM | POA: Diagnosis not present

## 2018-09-05 ENCOUNTER — Non-Acute Institutional Stay (SKILLED_NURSING_FACILITY): Payer: Medicare Other | Admitting: Adult Health

## 2018-09-05 ENCOUNTER — Other Ambulatory Visit: Payer: Self-pay | Admitting: Adult Health

## 2018-09-05 ENCOUNTER — Encounter: Payer: Medicare Other | Admitting: Internal Medicine

## 2018-09-05 ENCOUNTER — Encounter: Payer: Self-pay | Admitting: Adult Health

## 2018-09-05 DIAGNOSIS — I10 Essential (primary) hypertension: Secondary | ICD-10-CM

## 2018-09-05 DIAGNOSIS — K5909 Other constipation: Secondary | ICD-10-CM

## 2018-09-05 DIAGNOSIS — I351 Nonrheumatic aortic (valve) insufficiency: Secondary | ICD-10-CM | POA: Diagnosis not present

## 2018-09-05 DIAGNOSIS — G40909 Epilepsy, unspecified, not intractable, without status epilepticus: Secondary | ICD-10-CM

## 2018-09-05 DIAGNOSIS — J449 Chronic obstructive pulmonary disease, unspecified: Secondary | ICD-10-CM | POA: Diagnosis not present

## 2018-09-05 DIAGNOSIS — B20 Human immunodeficiency virus [HIV] disease: Secondary | ICD-10-CM

## 2018-09-05 DIAGNOSIS — G4709 Other insomnia: Secondary | ICD-10-CM

## 2018-09-05 DIAGNOSIS — Z8719 Personal history of other diseases of the digestive system: Secondary | ICD-10-CM

## 2018-09-05 DIAGNOSIS — Z992 Dependence on renal dialysis: Secondary | ICD-10-CM

## 2018-09-05 DIAGNOSIS — N186 End stage renal disease: Secondary | ICD-10-CM | POA: Diagnosis not present

## 2018-09-05 MED ORDER — ALBUTEROL SULFATE (2.5 MG/3ML) 0.083% IN NEBU: 2.5000 mg | INHALATION_SOLUTION | Freq: Four times a day (QID) | RESPIRATORY_TRACT | 0 refills | Status: AC | PRN

## 2018-09-05 MED ORDER — IPRATROPIUM-ALBUTEROL 20-100 MCG/ACT IN AERS: 2.0000 | INHALATION_SPRAY | Freq: Four times a day (QID) | RESPIRATORY_TRACT | 0 refills | Status: DC | PRN

## 2018-09-05 MED ORDER — TRAMADOL HCL 50 MG PO TABS: 50.0000 mg | ORAL_TABLET | Freq: Two times a day (BID) | ORAL | 0 refills | Status: DC | PRN

## 2018-09-05 MED ORDER — HYDRALAZINE HCL 100 MG PO TABS: 100.0000 mg | ORAL_TABLET | Freq: Three times a day (TID) | ORAL | 0 refills | Status: DC

## 2018-09-05 MED ORDER — RENA-VITE PO TABS: 1.0000 | ORAL_TABLET | Freq: Every day | ORAL | 0 refills | Status: AC

## 2018-09-05 MED ORDER — CALCIUM ACETATE (PHOS BINDER) 667 MG PO CAPS: 1334.0000 mg | ORAL_CAPSULE | ORAL | 0 refills | Status: DC

## 2018-09-05 MED ORDER — ISOSORBIDE MONONITRATE 20 MG PO TABS: 20.0000 mg | ORAL_TABLET | Freq: Two times a day (BID) | ORAL | 0 refills | Status: DC

## 2018-09-05 MED ORDER — LACTULOSE 10 GM/15ML PO SOLN: 20.0000 g | Freq: Every day | ORAL | 0 refills | Status: DC

## 2018-09-05 MED ORDER — CLONIDINE HCL 0.3 MG PO TABS: 0.3000 mg | ORAL_TABLET | Freq: Three times a day (TID) | ORAL | 0 refills | Status: DC

## 2018-09-05 MED ORDER — TRAZODONE HCL 50 MG PO TABS: 50.0000 mg | ORAL_TABLET | Freq: Every evening | ORAL | 0 refills | Status: DC | PRN

## 2018-09-05 MED ORDER — BICTEGRAVIR-EMTRICITAB-TENOFOV 50-200-25 MG PO TABS: 1.0000 | ORAL_TABLET | Freq: Every day | ORAL | 0 refills | Status: DC

## 2018-09-05 MED ORDER — OMEPRAZOLE 20 MG PO CPDR: 20.0000 mg | DELAYED_RELEASE_CAPSULE | Freq: Every day | ORAL | 0 refills | Status: DC

## 2018-09-05 MED ORDER — AMLODIPINE BESYLATE 10 MG PO TABS: 10.0000 mg | ORAL_TABLET | Freq: Every day | ORAL | 0 refills | Status: DC

## 2018-09-05 MED ORDER — LACOSAMIDE 150 MG PO TABS: 1.0000 | ORAL_TABLET | Freq: Two times a day (BID) | ORAL | 0 refills | Status: DC

## 2018-09-05 NOTE — Progress Notes (Signed)
Location:  [facility] Room Number: 373-S Place of Service:  SNF (31) Provider:  Durenda Age, NP  Patient Care Team: Velna Ochs, MD as PCP - General (Internal Medicine) Campbell Riches, MD as PCP - Infectious Diseases (Infectious Diseases) Sherren Mocha, MD as PCP - Cardiology (Cardiology) Delice Bison, DO as Resident (Internal Medicine)  Extended Emergency Contact Information Primary Emergency Contact: [name] Address: [address]          [place], [place] [address] Johnnette Litter of [place] Phone: [phone] Mobile Phone: [phone] Relation: Brother  Code Status:  Full Code  Goals of care: Advanced Directive information Advanced Directives 08/08/2018  Does Patient Have a Medical Advance Directive? Yes  Type of Advance Directive [place]  Does patient want to make changes to medical advance directive? No - Patient declined  Copy of [place] in Chart? No - copy requested  Would patient like information on creating a medical advance directive? -  Pre-existing out of facility DNR order (yellow form or pink MOST form) -     Chief Complaint  Patient presents with  . Discharge Note    Patient to discharge home on 09/06/18    HPI:  Pt is a 62 y.o. male seen today for a discharge visit.  He is to discharge home on 09/06/18 with home health PT and OT, and Nurse for medication management.  He has been admitted to [facility] on 08/20/18 after a recent hospitalization at [facility] 11/7 - 08/20/18 severe aortic valve insufficiency, S/P bioprosthetic aortic valve replacement on 08/09/2018.  He was diagnosed to have aortic insufficiency and was referred to surgery on December/2017.  He did not want to pursue surgery at that point.  A repeat echo showed moderate aortic insufficiency.  He had a catheterizationWhich showed 35 to 40% left main stenosis.  It was  elected to be observed at that point. He has a PMH of ESRD on hemodialysis, HIV, hepatitis B, hypertension, seizures, hypothyroidism, PAD, thrombocytopenia, and tubulovillous adenoma of the duodenum.   He was having shortness of breath over the summer and was hospitalized last August with methicillin-resistant Staphylococcus epidermidis bacteremia.  Trans-esophageal cardiography showed small vegetation and severe aortic insufficiency.  He was treated with vancomycin and was still short of breath with mild exertion after completion of antibiotics so surgery was elected.  Patient was admitted to this facility for short-term rehabilitation after the patient's recent hospitalization.  Patient has completed SNF rehabilitation and therapy has cleared the patient for discharge.    Past Medical History:  Diagnosis Date  . Anemia   . Anxiety   . Aortic valve stenosis   . Arthritis   . Asthma    per pt hx  . Dyspnea   . End stage renal disease on home HD 07/10/2011   Started HD in September 2012 at [facility] with a tunneled HD catheter, now on home HD with [facility]. Dialyzing through AVF L lower arm with buttonhole technique as of mid 2014. His brother does the HD treatments at home.  They are roommates for 23 years.  The brother works 3rd shift and gets off about 8am and then puts Mr Brink on HD in the morning after getting home. Most of the time he does HD about 4 times a week, for about 4 hours per treatment. Cause of ESRD was HTN according to patient. He says he let his health go and ending up with complications, and  that he didn't like seeing doctors in those days.  He says he was diagnosed with severe HTN when he lived in [place] in his 35's.   . Hepatitis B carrier ([place])   . HIV infection ([place])   . Hypertension   . Hyperthyroidism    normal now  . Pneumonia several yrs ago  . Seizures ([place]) 06/02/2011   had a mild one in Sept. 2019  . Sleep apnea    to start Cpap soon  . Thrombocytopenia  ([place])    Past Surgical History:  Procedure Laterality Date  . AORTIC VALVE REPLACEMENT N/A 07/28/2018   Procedure: AORTIC VALVE REPLACEMENT (AVR) using Inspiris Resilia  size 23 Aortic Valve.;  Surgeon: [name], MD;  Location: [facility];  Service: Open Heart Surgery;  Laterality: N/A;  . AV FISTULA PLACEMENT  06/02/11   Left radiocephalic AVF  . BIOPSY  02/17/2018   Procedure: BIOPSY;  Surgeon: Milus Banister, MD;  Location: [facility] ENDOSCOPY;  Service: Endoscopy;;  . COLONOSCOPY    . COLONOSCOPY WITH PROPOFOL N/A 01/27/2018   Procedure: COLONOSCOPY WITH PROPOFOL;  Surgeon: [name], MD;  Location: [facility] ENDOSCOPY;  Service: Gastroenterology;  Laterality: N/A;  . CORONARY ANGIOPLASTY    . ERCP  03/21/2018   AT [facility]  . ESOPHAGOGASTRODUODENOSCOPY N/A 02/17/2018   Procedure: ESOPHAGOGASTRODUODENOSCOPY (EGD);  Surgeon: Milus Banister, MD;  Location: [facility];  Service: Endoscopy;  Laterality: N/A;  . ESOPHAGOGASTRODUODENOSCOPY (EGD) WITH PROPOFOL N/A 01/27/2018   Procedure: ESOPHAGOGASTRODUODENOSCOPY (EGD) WITH PROPOFOL;  Surgeon: [name], MD;  Location: [facility] ENDOSCOPY;  Service: Gastroenterology;  Laterality: N/A;  . ESOPHAGOGASTRODUODENOSCOPY (EGD) WITH PROPOFOL N/A 04/03/2018   Procedure: ESOPHAGOGASTRODUODENOSCOPY (EGD) WITH PROPOFOL;  Surgeon: Clarene Essex, MD;  Location: [facility];  Service: Endoscopy;  Laterality: N/A;  . EUS N/A 02/17/2018   Procedure: UPPER ENDOSCOPIC ULTRASOUND (EUS) RADIAL;  Surgeon: Milus Banister, MD;  [facility];  Service: Endoscopy;  Laterality: N/A;  . fistulaogram     x 2 last 2 years  . IR DIALY SHUNT INTRO NEEDLE/INTRACATH INITIAL W/IMG LEFT Left 04/20/2018  . IR FLUORO GUIDE CV LINE RIGHT  09/16/2017  . IR US GUIDE VASC ACCESS RIGHT  09/16/2017  . IRRIGATION AND DEBRIDEMENT KNEE Left 09/15/2017   Procedure: IRRIGATION AND DEBRIDEMENT KNEE; arthroscopic clean out;  Surgeon: [name], MD;  Location: [facility] OR[facility];  Service:  Orthopedics;  Laterality: Left;  . OTHER SURGICAL HISTORY     removal temporary HD catheter   . REVISON OF ARTERIOVENOUS FISTULA Left 10/10/2015   Procedure: REVISON OF LEFT RADIOCEPHALIC ARTERIOVENOUS FISTULA;  Surgeon: Angelia Mould, MD;  Location: [facility];  Service: Vascular;  Laterality: Left;  . REVISON OF ARTERIOVENOUS FISTULA Left 02/07/2016   Procedure: REPAIR OF PSEUDO-ANEUREYSM OF LEFT ARM  ARTERIOVENOUS FISTULA;  Surgeon: Angelia Mould, MD;  Location: [facility];  Service: Vascular;  Laterality: Left;  . REVISON OF ARTERIOVENOUS FISTULA Left 5/62/5638   Procedure: PLICATION OF LEFT ARM RADIOCEPHALIC ARTERIOVENOUS FISTULA PSEUDOANEURYSM;  Surgeon: Angelia Mould, MD;  Location: [facility];  Service: Vascular;  Laterality: Left;  . REVISON OF ARTERIOVENOUS FISTULA Left 07/04/2018   Procedure: REVISION OF ARTERIOVENOUS RADIOCEPHALIC FISTULA;  Surgeon: Angelia Mould, MD;  Location: [facility];  Service: Vascular;  Laterality: Left;  . RIGHT/LEFT HEART CATH AND CORONARY ANGIOGRAPHY N/A 02/17/2017   Procedure: Right/Left Heart Cath and Coronary Angiography;  Surgeon: Sherren Mocha, MD;  Location: [facility] CV LAB;  Service: Cardiovascular;  Laterality:  N/A;  . RIGHT/LEFT HEART CATH AND CORONARY ANGIOGRAPHY N/A 07/25/2018   Procedure: RIGHT/LEFT HEART CATH AND CORONARY ANGIOGRAPHY;  Surgeon: Leonie Man, MD;  Location: [facility] CV LAB;  Service: Cardiovascular;  Laterality: N/A;  . TEE WITHOUT CARDIOVERSION N/A 09/11/2016   Procedure: TRANSESOPHAGEAL ECHOCARDIOGRAM (TEE);  Surgeon: Dorothy Spark, MD;  Location: [facility];  Service: Cardiovascular;  Laterality: N/A;  . TEE WITHOUT CARDIOVERSION N/A 05/05/2018   Procedure: TRANSESOPHAGEAL ECHOCARDIOGRAM (TEE);  Surgeon: Adrian Prows, MD;  Location: [facility];  Service: Cardiovascular;  Laterality: N/A;  Prefer after 3:30 PM  . TEE WITHOUT CARDIOVERSION N/A 07/28/2018   Procedure: TRANSESOPHAGEAL ECHOCARDIOGRAM  (TEE);  Surgeon: [name], MD;  Location: [facility];  Service: Open Heart Surgery;  Laterality: N/A;    Allergies  Allergen Reactions  . Lisinopril Anaphylaxis and Shortness Of Breath    Throat swelling  . Penicillins Anaphylaxis and Other (See Comments)    Childhood allergy Has patient had a PCN reaction causing immediate rash, facial/tongue/throat swelling, SOB or lightheadedness with hypotension: Yes Has patient had a PCN reaction causing severe rash involving mucus membranes or skin necrosis: Unk Has patient had a PCN reaction that required hospitalization: Unk Has patient had a PCN reaction occurring within the last 10 years: No If all of the above answers are "NO", then may proceed with Cephalosporin use.   . Fentanyl Other (See Comments)    Lethargy, AMS  . Morphine And Related Other (See Comments)    "Not Himself"    Outpatient Encounter Medications as of 09/05/2018  Medication Sig  . acetaminophen (TYLENOL) 325 MG tablet Take 2 tablets (650 mg total) by mouth every 6 (six) hours as needed for fever, headache, mild pain or moderate pain.  Marland Kitchen albuterol (PROVENTIL) (2.5 MG/3ML) 0.083% nebulizer solution Take 2.5 mg by nebulization every 6 (six) hours as needed for wheezing or shortness of breath.  . Amino Acids-Protein Hydrolys (FEEDING SUPPLEMENT, PRO-STAT SUGAR FREE 64,) LIQD Take 30 mLs by mouth 2 (two) times daily.  Marland Kitchen amLODipine (NORVASC) 10 MG tablet Take 10 mg by mouth daily.  Marland Kitchen aspirin EC 325 MG EC tablet Take 1 tablet (325 mg total) by mouth daily.  . bictegravir-emtricitabine-tenofovir AF (BIKTARVY) 50-200-25 MG TABS tablet Take 1 tablet by mouth daily.  . calcium acetate (PHOSLO) 667 MG capsule Take 1,334-2,001 mg by mouth See admin instructions. Take 2,001 mg three times a day with each meal and 1,334 mg with each snack  . cloNIDine (CATAPRES) 0.3 MG tablet Take 0.3 mg by mouth 3 (three) times daily.  . ferric gluconate 125 mg in sodium chloride 0.9 % 100 mL  Inject 125 mg into the vein every Monday, Wednesday, and Friday with hemodialysis.  . hydrALAZINE (APRESOLINE) 100 MG tablet TAKE 1 TABLET(100 MG) BY MOUTH THREE TIMES DAILY  . Ipratropium-Albuterol (COMBIVENT IN) Inhale 2 puffs into the lungs every 6 (six) hours as needed (as needed for shortness of breath).   . isosorbide mononitrate (ISMO,MONOKET) 20 MG tablet Take 20 mg by mouth 2 (two) times daily at 10 AM and 5 PM.  . Lacosamide (VIMPAT) 150 MG TABS Take 1 tablet by mouth 2 (two) times daily.  Marland Kitchen lactulose (CHRONULAC) 10 GM/15ML solution Take 20 g by mouth daily.  . multivitamin (RENA-VIT) TABS tablet Take 1 tablet by mouth daily.    Marland Kitchen omeprazole (PRILOSEC) 20 MG capsule Take 1 capsule (20 mg total) by mouth daily.  . OXYGEN Inhale 2 L/min into the lungs as  needed (Hypoxia).  . traMADol (ULTRAM) 50 MG tablet Take 50 mg by mouth every 12 (twelve) hours as needed for moderate pain.  . traZODone (DESYREL) 50 MG tablet TAKE 1 TABLET(50 MG) BY MOUTH AT BEDTIME AS NEEDED FOR SLEEP  . [DISCONTINUED] ALPRAZolam (XANAX) 0.25 MG tablet Take 0.25 mg by mouth at bedtime as needed for anxiety.    Facility-Administered Encounter Medications as of 09/05/2018  Medication  . etomidate (AMIDATE) injection  . rocuronium bromide 10 mg/mL (PF) syringe    Review of Systems  GENERAL: No change in appetite, no fatigue, no weight changes, no fever, chills or weakness MOUTH and THROAT: Denies oral discomfort, gingival pain or bleeding RESPIRATORY: no cough, SOB, DOE, wheezing, hemoptysis CARDIAC: No chest pain, edema or palpitations GI: No abdominal pain, diarrhea, constipation, heart burn, nausea or vomiting GU: Denies dysuria, frequency, hematuria, incontinence, or discharge PSYCHIATRIC: Denies feelings of depression or anxiety. No report of hallucinations, insomnia, paranoia, or agitation   Immunization History  Administered Date(s) Administered  . Influenza Split 09/24/202012, 08/31/2012, 06/23/2016  .  Influenza,inj,Quad PF,6+ Mos 06/13/2013, 05/14/2014, 06/26/2015, 06/16/2017  . PPD Test 07/21/2011  . Pneumococcal Polysaccharide-23 09/24/202012  . Pneumococcal-Unspecified 06/23/2016   Pertinent  Health Maintenance Due  Topic Date Due  . INFLUENZA VACCINE  04/21/2018  . COLONOSCOPY  01/28/2028   Fall Risk  06/20/2018 05/30/2018 05/09/2018 10/05/2017 09/08/2017  Falls in the past year? Yes Yes Yes No No  Number falls in past yr: 1 1 1  - -  Injury with Fall? Yes Yes Yes - -  Risk Factor Category  High Fall Risk High Fall Risk High Fall Risk - -  Risk for fall due to : History of fall(s);Impaired balance/gait;Impaired mobility - History of fall(s);Impaired balance/gait;Impaired mobility - -  Risk for fall due to: Comment - - - - -  Follow up Falls prevention discussed - - - -    Vitals:   09/05/18 1028  BP: (!) 141/76  Pulse: 79  Resp: 17  Temp: 97.7 F (36.5 C)  TempSrc: Oral  SpO2: 96%  Weight: 158 lb 2.9 oz (71.7 kg)  Height: 6' (1.829 m)   Body mass index is 21.45 kg/m.  Physical Exam  GENERAL APPEARANCE: In no acute distress. Normal body habitus SKIN:  Skin is warm and dry.  MOUTH and THROAT: Lips are without lesions. Oral mucosa is moist and without lesions.  RESPIRATORY: Breathing is even & unlabored, BS CTAB CARDIAC: RRR, no murmur,no extra heart sounds, no edema, left forearm AV Fistula + bruit/thrill GI: Abdomen soft, normal BS, no masses, no tenderness EXTREMITIES:  Able to move X 4 extremities NEUROLOGICAL: There is no tremor. Speech is clear PSYCHIATRIC: Alert and oriented X 3. Affect and behavior are appropriate    Labs reviewed: Recent Labs    07/28/18 1848  07/29/18 0415 07/29/18 1556  08/14/18 1850 08/17/18 1357 08/20/18 0619  NA  --    < > 137  --    < > 130* 129* 130*  K  --    < > 5.5*  --    < > 4.2 4.4 4.8  CL  --    < > 106  --    < > 93* 91* 91*  CO2  --    < > 20*  --    < > 26 24 25   GLUCOSE  --    < > 124*  --    < > 91 99 102*  BUN   --    < >  54*  --    < > 36* 50* 46*  CREATININE 9.23*   < > 9.89* 10.40*   < > 6.73* 7.92* 7.87*  CALCIUM  --    < > 8.6*  --    < > 8.5* 8.4* 8.3*  MG 1.7  --  1.6* 1.7  --   --   --   --   PHOS  --    < >  --   --    < > 3.7 3.6 3.5   < > = values in this interval not displayed.   Recent Labs    07/09/18 2149 07/10/18 0034  07/26/18 1458  08/14/18 1850 08/17/18 1357 08/20/18 0619  AST 15 19  --  12*  --   --   --   --   ALT <5 7  --  6  --   --   --   --   ALKPHOS 65 62  --  69  --   --   --   --   BILITOT 1.0 0.9  --  1.0  --   --   --   --   PROT 7.9 7.2  --  6.3*  --   --   --   --   ALBUMIN 3.6 3.4*   < > 3.0*   < > 2.5* 2.5* 2.4*   < > = values in this interval not displayed.   Recent Labs    06/04/18 1007  07/13/18 0500  07/15/18 0558  08/14/18 1850 08/17/18 1357 08/20/18 0619  WBC 7.2   < > 4.5   < > 6.4   < > 7.4 5.2 5.1  NEUTROABS 4.1  --  3.8  --  4.3  --   --   --   --   HGB 10.3*   < > 7.8*   < > 8.6*   < > 8.2* 7.9* 7.2*  HCT 33.7*   < > 26.1*   < > 29.3*   < > 26.1* 25.8* 23.6*  MCV 94.9   < > 93.9   < > 95.1   < > 93.5 92.8 94.0  PLT 148*   < > 79*   < > 145*   < > 234 207 212   < > = values in this interval not displayed.   Lab Results  Component Value Date   TSH 4.286 07/10/2018   Lab Results  Component Value Date   HGBA1C 4.2 (L) 07/26/2018   Lab Results  Component Value Date   CHOL 98 07/14/2018   HDL 35 (L) 07/14/2018   LDLCALC 49 07/14/2018   TRIG 77 07/16/2018   CHOLHDL 2.8 07/14/2018    Significant Diagnostic Results in last 30 days:  Dg Chest Port 1 View  Result Date: 08/10/2018 CLINICAL DATA:  Shortness of EXAM: PORTABLE CHEST 1 VIEW COMPARISON:  Five days ago FINDINGS: Left pleural effusion with opacified lower lobe, similar to prior. Cardiomegaly. Status post aortic valve replacement. Large volume right lung. No Kerley lines, air bronchogram, or pneumothorax. IMPRESSION: Unchanged left pleural effusion with opacified or  obscured lower lobe. A left pleural effusion has been noted since July 2019. Electronically Signed   By: Monte Fantasia M.D.   On: 08/10/2018 15:09    Assessment/Plan  1. Severe aortic insufficiency - S/P aortic valve replacement on 07/28/18 - isosorbide mononitrate (ISMO,MONOKET) 20 MG tablet; Take 1 tablet (20 mg total) by mouth 2 (two) times daily at  10 AM and 5 PM.  Dispense: 60 tablet; Refill: 0 - traMADol (ULTRAM) 50 MG tablet; Take 1 tablet (50 mg total) by mouth every 12 (twelve) hours as needed for moderate pain.  Dispense: 14 tablet; Refill: 0  2. Essential hypertension - amLODipine (NORVASC) 10 MG tablet; Take 1 tablet (10 mg total) by mouth daily.  Dispense: 30 tablet; Refill: 0 - cloNIDine (CATAPRES) 0.3 MG tablet; Take 1 tablet (0.3 mg total) by mouth 3 (three) times daily.  Dispense: 90 tablet; Refill: 0 - hydrALAZINE (APRESOLINE) 100 MG tablet; Take 1 tablet (100 mg total) by mouth 3 (three) times daily.  Dispense: 90 tablet; Refill: 0  3. Seizure disorder (HCC) - Lacosamide (VIMPAT) 150 MG TABS; Take 1 tablet (150 mg total) by mouth 2 (two) times daily.  Dispense: 60 tablet; Refill: 0  4. ESRD on hemodialysis (HCC) - calcium acetate (PHOSLO) 667 MG capsule; Take 2-3 capsules (1,334-2,001 mg total) by mouth See admin instructions. Take 2,001 mg three times a day with each meal and 1,334 mg with each snack  Dispense: 390 capsule; Refill: 0  5. HIV disease ([place]) - bictegravir-emtricitabine-tenofovir AF (BIKTARVY) 50-200-25 MG TABS tablet; Take 1 tablet by mouth daily.  Dispense: 30 tablet; Refill: 0  6. History of GI bleed - omeprazole (PRILOSEC) 20 MG capsule; Take 1 capsule (20 mg total) by mouth daily.  Dispense: 30 capsule; Refill: 0  7. Other insomnia - traZODone (DESYREL) 50 MG tablet; Take 1 tablet (50 mg total) by mouth at bedtime as needed for sleep.  Dispense: 30 tablet; Refill: 0  8. Chronic obstructive pulmonary disease, unspecified COPD type (HCC) - albuterol  (PROVENTIL) (2.5 MG/3ML) 0.083% nebulizer solution; Take 3 mLs (2.5 mg total) by nebulization every 6 (six) hours as needed for wheezing or shortness of breath.  Dispense: 360 mL; Refill: 0 - Ipratropium-Albuterol (COMBIVENT) 20-100 MCG/ACT AERS respimat; Inhale 2 puffs into the lungs every 6 (six) hours as needed for wheezing (SOB).  Dispense: 4 g; Refill: 0  9. Chronic constipation - lactulose (CHRONULAC) 10 GM/15ML solution; Take 30 mLs (20 g total) by mouth daily.  Dispense: 236 mL; Refill: 0     I have filled out patient's discharge paperwork and written prescriptions.  Patient will receive home health PT OT, and Nursing.  DME provided:  Wheelchair and 3-in-1 commode   Total discharge time: Greater than 30 minutes Greater than 50% was spent in counseling and coordination of care.   Discharge time involved coordination of the discharge process with social worker, nursing staff and therapy department. Medical justification for home health services/DME verified.   Durenda Age, NP [facility] [phone] (Monday-Friday 8:00 a.m. - 5:00 p.m.) [phone] (after hours)

## 2018-09-06 DIAGNOSIS — D631 Anemia in chronic kidney disease: Secondary | ICD-10-CM | POA: Diagnosis not present

## 2018-09-06 DIAGNOSIS — N186 End stage renal disease: Secondary | ICD-10-CM | POA: Diagnosis not present

## 2018-09-06 DIAGNOSIS — Z992 Dependence on renal dialysis: Secondary | ICD-10-CM | POA: Diagnosis not present

## 2018-09-08 DIAGNOSIS — N186 End stage renal disease: Secondary | ICD-10-CM | POA: Diagnosis not present

## 2018-09-08 DIAGNOSIS — D631 Anemia in chronic kidney disease: Secondary | ICD-10-CM | POA: Diagnosis not present

## 2018-09-08 DIAGNOSIS — Z992 Dependence on renal dialysis: Secondary | ICD-10-CM | POA: Diagnosis not present

## 2018-09-09 DIAGNOSIS — Z87891 Personal history of nicotine dependence: Secondary | ICD-10-CM | POA: Diagnosis not present

## 2018-09-09 DIAGNOSIS — F419 Anxiety disorder, unspecified: Secondary | ICD-10-CM | POA: Diagnosis not present

## 2018-09-09 DIAGNOSIS — B20 Human immunodeficiency virus [HIV] disease: Secondary | ICD-10-CM | POA: Diagnosis not present

## 2018-09-09 DIAGNOSIS — D696 Thrombocytopenia, unspecified: Secondary | ICD-10-CM | POA: Diagnosis not present

## 2018-09-09 DIAGNOSIS — J449 Chronic obstructive pulmonary disease, unspecified: Secondary | ICD-10-CM | POA: Diagnosis not present

## 2018-09-09 DIAGNOSIS — G40909 Epilepsy, unspecified, not intractable, without status epilepticus: Secondary | ICD-10-CM | POA: Diagnosis not present

## 2018-09-09 DIAGNOSIS — N186 End stage renal disease: Secondary | ICD-10-CM | POA: Diagnosis not present

## 2018-09-09 DIAGNOSIS — B1910 Unspecified viral hepatitis B without hepatic coma: Secondary | ICD-10-CM | POA: Diagnosis not present

## 2018-09-09 DIAGNOSIS — Z992 Dependence on renal dialysis: Secondary | ICD-10-CM | POA: Diagnosis not present

## 2018-09-09 DIAGNOSIS — I2510 Atherosclerotic heart disease of native coronary artery without angina pectoris: Secondary | ICD-10-CM | POA: Diagnosis not present

## 2018-09-09 DIAGNOSIS — Z48812 Encounter for surgical aftercare following surgery on the circulatory system: Secondary | ICD-10-CM | POA: Diagnosis not present

## 2018-09-09 DIAGNOSIS — I120 Hypertensive chronic kidney disease with stage 5 chronic kidney disease or end stage renal disease: Secondary | ICD-10-CM | POA: Diagnosis not present

## 2018-09-09 DIAGNOSIS — I739 Peripheral vascular disease, unspecified: Secondary | ICD-10-CM | POA: Diagnosis not present

## 2018-09-09 DIAGNOSIS — D631 Anemia in chronic kidney disease: Secondary | ICD-10-CM | POA: Diagnosis not present

## 2018-09-09 DIAGNOSIS — M1990 Unspecified osteoarthritis, unspecified site: Secondary | ICD-10-CM | POA: Diagnosis not present

## 2018-09-11 ENCOUNTER — Other Ambulatory Visit: Payer: Self-pay | Admitting: Adult Health

## 2018-09-12 DIAGNOSIS — J449 Chronic obstructive pulmonary disease, unspecified: Secondary | ICD-10-CM | POA: Diagnosis not present

## 2018-09-12 DIAGNOSIS — I2510 Atherosclerotic heart disease of native coronary artery without angina pectoris: Secondary | ICD-10-CM | POA: Diagnosis not present

## 2018-09-12 DIAGNOSIS — Z992 Dependence on renal dialysis: Secondary | ICD-10-CM | POA: Diagnosis not present

## 2018-09-12 DIAGNOSIS — Z48812 Encounter for surgical aftercare following surgery on the circulatory system: Secondary | ICD-10-CM | POA: Diagnosis not present

## 2018-09-12 DIAGNOSIS — I120 Hypertensive chronic kidney disease with stage 5 chronic kidney disease or end stage renal disease: Secondary | ICD-10-CM | POA: Diagnosis not present

## 2018-09-12 DIAGNOSIS — D631 Anemia in chronic kidney disease: Secondary | ICD-10-CM | POA: Diagnosis not present

## 2018-09-12 DIAGNOSIS — N186 End stage renal disease: Secondary | ICD-10-CM | POA: Diagnosis not present

## 2018-09-13 DIAGNOSIS — I120 Hypertensive chronic kidney disease with stage 5 chronic kidney disease or end stage renal disease: Secondary | ICD-10-CM | POA: Diagnosis not present

## 2018-09-13 DIAGNOSIS — Z992 Dependence on renal dialysis: Secondary | ICD-10-CM | POA: Diagnosis not present

## 2018-09-13 DIAGNOSIS — N186 End stage renal disease: Secondary | ICD-10-CM | POA: Diagnosis not present

## 2018-09-13 DIAGNOSIS — J449 Chronic obstructive pulmonary disease, unspecified: Secondary | ICD-10-CM | POA: Diagnosis not present

## 2018-09-13 DIAGNOSIS — D631 Anemia in chronic kidney disease: Secondary | ICD-10-CM | POA: Diagnosis not present

## 2018-09-13 DIAGNOSIS — I2510 Atherosclerotic heart disease of native coronary artery without angina pectoris: Secondary | ICD-10-CM | POA: Diagnosis not present

## 2018-09-13 DIAGNOSIS — Z48812 Encounter for surgical aftercare following surgery on the circulatory system: Secondary | ICD-10-CM | POA: Diagnosis not present

## 2018-09-15 DIAGNOSIS — N186 End stage renal disease: Secondary | ICD-10-CM | POA: Diagnosis not present

## 2018-09-15 DIAGNOSIS — Z992 Dependence on renal dialysis: Secondary | ICD-10-CM | POA: Diagnosis not present

## 2018-09-15 DIAGNOSIS — D631 Anemia in chronic kidney disease: Secondary | ICD-10-CM | POA: Diagnosis not present

## 2018-09-16 ENCOUNTER — Ambulatory Visit (INDEPENDENT_AMBULATORY_CARE_PROVIDER_SITE_OTHER): Payer: Medicare Other | Admitting: Internal Medicine

## 2018-09-16 ENCOUNTER — Encounter: Payer: Self-pay | Admitting: Internal Medicine

## 2018-09-16 ENCOUNTER — Other Ambulatory Visit: Payer: Self-pay

## 2018-09-16 VITALS — BP 136/95 | HR 81 | Temp 97.5°F

## 2018-09-16 DIAGNOSIS — I10 Essential (primary) hypertension: Secondary | ICD-10-CM

## 2018-09-16 DIAGNOSIS — Z992 Dependence on renal dialysis: Secondary | ICD-10-CM | POA: Diagnosis not present

## 2018-09-16 DIAGNOSIS — Z7982 Long term (current) use of aspirin: Secondary | ICD-10-CM | POA: Diagnosis not present

## 2018-09-16 DIAGNOSIS — Z953 Presence of xenogenic heart valve: Secondary | ICD-10-CM

## 2018-09-16 DIAGNOSIS — J90 Pleural effusion, not elsewhere classified: Secondary | ICD-10-CM

## 2018-09-16 DIAGNOSIS — R5381 Other malaise: Secondary | ICD-10-CM | POA: Diagnosis not present

## 2018-09-16 DIAGNOSIS — G4730 Sleep apnea, unspecified: Secondary | ICD-10-CM | POA: Diagnosis not present

## 2018-09-16 DIAGNOSIS — Z87891 Personal history of nicotine dependence: Secondary | ICD-10-CM | POA: Diagnosis not present

## 2018-09-16 DIAGNOSIS — Z952 Presence of prosthetic heart valve: Secondary | ICD-10-CM

## 2018-09-16 DIAGNOSIS — Z79899 Other long term (current) drug therapy: Secondary | ICD-10-CM

## 2018-09-16 DIAGNOSIS — I351 Nonrheumatic aortic (valve) insufficiency: Secondary | ICD-10-CM

## 2018-09-16 NOTE — Assessment & Plan Note (Signed)
Patient was admitted to [facility] living facility on November 30 after hospitalization November 7-30 for severe aortic valve insufficiency requiring bioprosthetic aortic valve replacement on November 19.  He was discharged from [facility] on December 16 and is here for follow up. Since the surgery he has had relief from his long standing dyspnea. He has been working with physical therapy and been aware of post op recommendations to avoid bearing weight with his upper extremities. He denies presyncope, chest pain, orthopnea, or PND. His exam is notable for reduced breath sounds over the left lung fields. Review of prior chest xrays reveal that there has been a pleural effusion dating back to June. He has cardiology follow up next week.  - obtain CXR to evaluate for progression of the pleural effusion  - continue ASA and antihypertensives as mentioned below

## 2018-09-16 NOTE — Patient Instructions (Addendum)
Thank you for coming to the clinic today. It was a pleasure to see you.   For your hypertension - please make sure you take hydralazine 100 mg Three times daily, clonidine patch, and isosorbide mononitrate 20 mg daily Restart the amlodipine 10 mg daily  I have ordered prescriptions for your shower chair and toilet seat  Please ask for an update on the status of obtaining your CPAP before you leave.   FOLLOW-UP INSTRUCTIONS When: 3-6 months with your primary care doctor   For: follow up of your general health  What to bring: all of your medication bottles   Please call the internal medicine center clinic if you have any questions or concerns, we may be able to help and keep you from a long and expensive emergency room wait. Our clinic and after hours phone number is [phone], the best time to call is Monday through Friday 9 am to 4 pm but there is always someone available 24/7 if you have an emergency. If you need medication refills please notify your pharmacy one week in advance and they will send [name] a request.

## 2018-09-16 NOTE — Progress Notes (Signed)
Internal Medicine Clinic Attending  Case discussed with Dr. Blum at the time of the visit.  We reviewed the resident's history and exam and pertinent patient test results.  I agree with the assessment, diagnosis, and plan of care documented in the resident's note. 

## 2018-09-16 NOTE — Progress Notes (Signed)
CC: follow up after [facility] stay after aortic valve replacement   HPI:  Jeffrey.Jeffrey [name] is a 62 y.o. with PMH as listed below who presents for follow up after [facility] nursing facility stay after aortic valve replacement . Please see the assessment and plans for the status of the patient chronic medical problems.   Past Medical History:  Diagnosis Date  . Anemia   . Anxiety   . Aortic valve stenosis   . Arthritis   . Asthma    per pt hx  . Dyspnea   . End stage renal disease on home HD 07/10/2011   Started HD in September 2012 at [facility] with a tunneled HD catheter, now on home HD with NxtStage. Dialyzing through AVF L lower arm with buttonhole technique as of mid 2014. His brother does the HD treatments at home.  They are roommates for 23 years.  The brother works 3rd shift and gets off about 8am and then puts Jeffrey [name] on HD in the morning after getting home. Most of the time he does HD about 4 times a week, for about 4 hours per treatment. Cause of ESRD was HTN according to patient. He says he let his health go and ending up with complications, and that he didn't like seeing doctors in those days.  He says he was diagnosed with severe HTN when he lived in [place] in his 70's.   . Hepatitis B carrier ([place])   . HIV infection ([place])   . Hypertension   . Hyperthyroidism    normal now  . Pneumonia several yrs ago  . Seizures ([place]) 06/02/2011   had a mild one in Sept. 2019  . Sleep apnea    to start Cpap soon  . Thrombocytopenia ([place])    Review of Systems: Refer to history of present illness and assessment and plans for pertinent review of systems, all others reviewed and negative  Physical Exam:  Vitals:   09/16/18 0956 09/16/18 1036  BP: (!) 134/103 (!) 136/95  Pulse: 86 81  Temp: (!) 97.5 F (36.4 C)   TempSrc: Oral   SpO2: 100%    General: no acute distress  Cardiac : loud S1, no murmur, no rubs or gallops, JVD visible to just over the right clavicle   Pulm: lungs are clear to auscultation, reduced breath sounds over the left lung field  GI: the abdomen is non tender, non distended   Assessment & Plan:   Aortic insufficiency post bioprosthetic AVR  Physical deconditioning  Patient was admitted to [facility] living facility on November 30 after hospitalization November 7-30 for severe aortic valve insufficiency requiring bioprosthetic aortic valve replacement on November 19.  He was discharged from [facility] on December 16 and is here for follow up. Since the surgery he has had relief from his long standing dyspnea. He has been working with physical therapy and been aware of post op recommendations to avoid bearing weight with his upper extremities. He denies presyncope, chest pain, orthopnea, or PND. His exam is notable for reduced breath sounds over the left lung fields. Review of prior chest xrays reveal that there has been a pleural effusion dating back to June. He has cardiology follow up next week.  - obtain CXR to evaluate for progression of the pleural effusion  - continue ASA and antihypertensives as mentioned below  - DME order placed for shower chair   Hypertension  Patient was noted to have elevated blood pressure. We reviewed  his medications and found that he has not been taking amlodipine. He mostly eats at home and does not have salt in his home. Patient has diagnosed sleep apnea but does not have a CPAP at home. He will meet with the front desk today for information on the status of obtaining his CPAP. Still completing 2.5 hours of dialysis four times weekly at home.   - resume amlodipine  - continue clonidine 0.3 mg patch, hydralazine 100 mg TID, and isosorbide mononitrate 20 mg daily   See Encounters Tab for problem based charting.  Patient discussed with Dr. Angelia Mould

## 2018-09-16 NOTE — Assessment & Plan Note (Addendum)
Patient was noted to have elevated blood pressure. We reviewed his medications and found that he has not been taking amlodipine. He mostly eats at home and does not have salt in his home. Patient has diagnosed sleep apnea but does not have a CPAP at home. He will meet with the front desk today for information on the status of obtaining his CPAP today. Still completing 2.5 hours of dialysis four times weekly at home.   - resume amlodipine  - continue clonidine 0.3 mg patch, hydralazine 100 mg TID, and isosorbide mononitrate 20 mg daily

## 2018-09-18 ENCOUNTER — Other Ambulatory Visit: Payer: Self-pay | Admitting: Adult Health

## 2018-09-18 DIAGNOSIS — J449 Chronic obstructive pulmonary disease, unspecified: Secondary | ICD-10-CM

## 2018-09-19 DIAGNOSIS — N186 End stage renal disease: Secondary | ICD-10-CM | POA: Diagnosis not present

## 2018-09-19 DIAGNOSIS — D631 Anemia in chronic kidney disease: Secondary | ICD-10-CM | POA: Diagnosis not present

## 2018-09-19 DIAGNOSIS — Z992 Dependence on renal dialysis: Secondary | ICD-10-CM | POA: Diagnosis not present

## 2018-09-20 ENCOUNTER — Ambulatory Visit
Admission: RE | Admit: 2018-09-20 | Discharge: 2018-09-20 | Disposition: A | Discharge status: Home / Self Care | Payer: Medicare Other | Source: Ambulatory Visit | Attending: Thoracic Surgery (Cardiothoracic Vascular Surgery) | Admitting: Thoracic Surgery (Cardiothoracic Vascular Surgery)

## 2018-09-20 ENCOUNTER — Other Ambulatory Visit: Payer: Self-pay | Admitting: *Deleted

## 2018-09-20 ENCOUNTER — Other Ambulatory Visit: Payer: Self-pay

## 2018-09-20 ENCOUNTER — Ambulatory Visit (INDEPENDENT_AMBULATORY_CARE_PROVIDER_SITE_OTHER): Payer: Self-pay | Admitting: Thoracic Surgery (Cardiothoracic Vascular Surgery)

## 2018-09-20 ENCOUNTER — Encounter: Payer: Self-pay | Admitting: Thoracic Surgery (Cardiothoracic Vascular Surgery)

## 2018-09-20 ENCOUNTER — Other Ambulatory Visit: Payer: Self-pay | Admitting: Thoracic Surgery (Cardiothoracic Vascular Surgery)

## 2018-09-20 VITALS — BP 169/88 | HR 78 | Resp 18 | Ht 72.0 in | Wt 150.8 lb

## 2018-09-20 DIAGNOSIS — N186 End stage renal disease: Secondary | ICD-10-CM | POA: Diagnosis not present

## 2018-09-20 DIAGNOSIS — Z952 Presence of prosthetic heart valve: Secondary | ICD-10-CM

## 2018-09-20 DIAGNOSIS — I350 Nonrheumatic aortic (valve) stenosis: Secondary | ICD-10-CM

## 2018-09-20 DIAGNOSIS — J90 Pleural effusion, not elsewhere classified: Secondary | ICD-10-CM

## 2018-09-20 DIAGNOSIS — I351 Nonrheumatic aortic (valve) insufficiency: Secondary | ICD-10-CM

## 2018-09-20 DIAGNOSIS — Z992 Dependence on renal dialysis: Secondary | ICD-10-CM | POA: Diagnosis not present

## 2018-09-20 DIAGNOSIS — I2510 Atherosclerotic heart disease of native coronary artery without angina pectoris: Secondary | ICD-10-CM

## 2018-09-20 DIAGNOSIS — D631 Anemia in chronic kidney disease: Secondary | ICD-10-CM | POA: Diagnosis not present

## 2018-09-20 MED ORDER — PREDNISONE 5 MG PO TABS: ORAL_TABLET | ORAL | 0 refills | Status: DC

## 2018-09-20 NOTE — Progress Notes (Signed)
[address]       [place] [address]             [phone]     HPI: Jeffrey [name] returns for a scheduled postoperative follow-up visit  Jeffrey [name] is a 62 year old gentleman with a history of end-stage renal disease on hemodialysis, HIV, hepatitis B, aortic valve endocarditis, tubulovillous adenoma of the duodenum, hypertension, hypothyroidism, peripheral arterial disease, seizures, and thrombocytopenia.  He was found to have staph bacteremia in November 2017.  He had aortic insufficiency.  He was referred for surgery but refused at that time.  I saw him back in June 2018 and his echo showed only moderate aortic insufficiency.  He was doing relatively well symptomatically at that point.  In August of this year he developed MRSE bacteremia.  An echo showed severe aortic insufficiency.  He was treated with vancomycin.  After completing antibiotics, he underwent aortic valve replacement with an Edwards Inspiris Resilia bovine pericardial valve on 07/28/2018.  His surgery was uncomplicated but he was slow to progress postoperatively.  There were placement issues due to his need for dialysis.  He finally went to [place] on 08/20/2018.  He now is back at home.  He says dialysis is been going well.  He has some incisional discomfort.  He is anxious to use his hands to help move around.  He denies shortness of breath and swelling in his legs.  Past Medical History:  Diagnosis Date  . Anemia   . Anxiety   . Aortic valve stenosis   . Arthritis   . Asthma    per pt hx  . Dyspnea   . End stage renal disease on home HD 07/10/2011   Started HD in September 2012 at [facility] with a tunneled HD catheter, now on home HD with NxtStage. Dialyzing through AVF L lower arm with buttonhole technique as of mid 2014. His brother does the HD treatments at home.  They are roommates for 23 years.  The brother works 3rd shift and gets off about 8am and then puts Jeffrey [name] on HD in the morning after  getting home. Most of the time he does HD about 4 times a week, for about 4 hours per treatment. Cause of ESRD was HTN according to patient. He says he let his health go and ending up with complications, and that he didn't like seeing doctors in those days.  He says he was diagnosed with severe HTN when he lived in [place] in his 66's.   . Hepatitis B carrier ([place])   . HIV infection ([place])   . Hypertension   . Hyperthyroidism    normal now  . Pneumonia several yrs ago  . Seizures ([place]) 06/02/2011   had a mild one in Sept. 2019  . Sleep apnea    to start Cpap soon  . Thrombocytopenia ([place])     Current Outpatient Medications  Medication Sig Dispense Refill  . acetaminophen (TYLENOL) 325 MG tablet Take 2 tablets (650 mg total) by mouth every 6 (six) hours as needed for fever, headache, mild pain or moderate pain.    Marland Kitchen albuterol (PROVENTIL) (2.5 MG/3ML) 0.083% nebulizer solution Take 3 mLs (2.5 mg total) by nebulization every 6 (six) hours as needed for wheezing or shortness of breath. 360 mL 0  . Amino Acids-Protein Hydrolys (FEEDING SUPPLEMENT, PRO-STAT SUGAR FREE 64,) LIQD Take 30 mLs by mouth 2 (two) times daily. 900 mL 0  . amLODipine (NORVASC)  10 MG tablet Take 1 tablet (10 mg total) by mouth daily. 30 tablet 0  . aspirin EC 325 MG EC tablet Take 1 tablet (325 mg total) by mouth daily. 30 tablet 0  . bictegravir-emtricitabine-tenofovir AF (BIKTARVY) 50-200-25 MG TABS tablet Take 1 tablet by mouth daily. 30 tablet 0  . calcium acetate (PHOSLO) 667 MG capsule Take 2-3 capsules (1,334-2,001 mg total) by mouth See admin instructions. Take 2,001 mg three times a day with each meal and 1,334 mg with each snack 390 capsule 0  . cloNIDine (CATAPRES) 0.3 MG tablet Take 1 tablet (0.3 mg total) by mouth 3 (three) times daily. 90 tablet 0  . ferric gluconate 125 mg in sodium chloride 0.9 % 100 mL Inject 125 mg into the vein every Monday, Wednesday, and Friday with hemodialysis.    . hydrALAZINE  (APRESOLINE) 100 MG tablet Take 1 tablet (100 mg total) by mouth 3 (three) times daily. 90 tablet 0  . Ipratropium-Albuterol (COMBIVENT) 20-100 MCG/ACT AERS respimat Inhale 2 puffs into the lungs every 6 (six) hours as needed for wheezing (SOB). 4 g 0  . isosorbide mononitrate (ISMO,MONOKET) 20 MG tablet Take 1 tablet (20 mg total) by mouth 2 (two) times daily at 10 AM and 5 PM. 60 tablet 0  . Lacosamide (VIMPAT) 150 MG TABS Take 1 tablet (150 mg total) by mouth 2 (two) times daily. 60 tablet 0  . lactulose (CHRONULAC) 10 GM/15ML solution Take 30 mLs (20 g total) by mouth daily. 236 mL 0  . multivitamin (RENA-VIT) TABS tablet Take 1 tablet by mouth daily. 30 tablet 0  . omeprazole (PRILOSEC) 20 MG capsule Take 1 capsule (20 mg total) by mouth daily. 30 capsule 0  . OXYGEN Inhale 2 L/min into the lungs as needed (Hypoxia).    . traMADol (ULTRAM) 50 MG tablet Take 1 tablet (50 mg total) by mouth every 12 (twelve) hours as needed for moderate pain. 14 tablet 0  . traZODone (DESYREL) 50 MG tablet Take 1 tablet (50 mg total) by mouth at bedtime as needed for sleep. 30 tablet 0   No current facility-administered medications for this visit.    Facility-Administered Medications Ordered in Other Visits  Medication Dose Route Frequency Provider Last Rate Last Dose  . etomidate (AMIDATE) injection    Anesthesia Intra-op Myna Bright, CRNA   16 mg at 07/10/18 2031  . rocuronium bromide 10 mg/mL (PF) syringe   Intravenous Anesthesia Intra-op Myna Bright, CRNA   50 mg at 07/10/18 2031    Physical Exam BP (!) 169/88 (BP Location: Right Arm, Patient Position: Sitting, Cuff Size: Normal)   Pulse 78   Resp 18   Ht 6' (1.829 m)   Wt 150 lb 12.7 oz (68.4 kg)   SpO2 99% Comment: RA  BMI 20.55 kg/m  62 year old man in no acute distress Alert and oriented x3 with no focal deficits Lungs diminished at left base, otherwise clear Cardiac regular rate and rhythm with a 2/6 systolic murmur Sternum  stable, incision well-healed No peripheral edema  Diagnostic Tests: CHEST - 2 VIEW  COMPARISON:  Chest x-ray dated August 10, 2018.  FINDINGS: Unchanged cardiomegaly status post aortic valve replacement. Increasing moderate to large left pleural effusion with left lingular and lower lobe atelectasis. New small opacities in the right mid to lower lung. No pneumothorax. No acute osseous abnormality.  IMPRESSION: 1. Increasing moderate to large left pleural effusion with worsening lingular and lower lobe atelectasis. 2. Small opacities in the  right mid to lower lung could reflect asymmetric edema or infection/inflammation.   Electronically Signed   By: Titus Dubin M.D.   On: 09/20/2018 15:50 I personally reviewed the chest x-ray images and concur with the findings noted above  Impression: Jeffrey [name] is a 62 year old gentleman who underwent aortic valve replacement after a second bout of aortic valve endocarditis left him with severe aortic insufficiency.  He made slow progress postoperatively and went to [facility] for rehabilitation.  He now is back at home.  Has some mild incisional discomfort, mostly when colder raining, but nothing out of the ordinary and is not requiring narcotics.  He is tolerating dialysis well.  His chest x-ray shows a moderate to large left pleural effusion.  It appears to possibly be somewhat loculated so I think an ultrasound-guided thoracentesis would be the safest bet.  I am going to go ahead and schedule that.  I am also can give him a prednisone taper.  He has had some agitation from high-dose prednisone before so we will go with a lower dose taper from 25 mg to 5 mg over 5 days.  Plan: Ultrasound-guided thoracentesis for left pleural effusion Prednisone taper Return in 3 weeks with repeat chest x-ray  [name], MD [facility] [phone]

## 2018-09-22 ENCOUNTER — Telehealth: Payer: Self-pay

## 2018-09-22 DIAGNOSIS — D631 Anemia in chronic kidney disease: Secondary | ICD-10-CM | POA: Diagnosis not present

## 2018-09-22 DIAGNOSIS — N2581 Secondary hyperparathyroidism of renal origin: Secondary | ICD-10-CM | POA: Diagnosis not present

## 2018-09-22 DIAGNOSIS — I120 Hypertensive chronic kidney disease with stage 5 chronic kidney disease or end stage renal disease: Secondary | ICD-10-CM | POA: Diagnosis not present

## 2018-09-22 DIAGNOSIS — I2510 Atherosclerotic heart disease of native coronary artery without angina pectoris: Secondary | ICD-10-CM | POA: Diagnosis not present

## 2018-09-22 DIAGNOSIS — J449 Chronic obstructive pulmonary disease, unspecified: Secondary | ICD-10-CM | POA: Diagnosis not present

## 2018-09-22 DIAGNOSIS — N186 End stage renal disease: Secondary | ICD-10-CM | POA: Diagnosis not present

## 2018-09-22 DIAGNOSIS — Z48812 Encounter for surgical aftercare following surgery on the circulatory system: Secondary | ICD-10-CM | POA: Diagnosis not present

## 2018-09-22 DIAGNOSIS — D509 Iron deficiency anemia, unspecified: Secondary | ICD-10-CM | POA: Diagnosis not present

## 2018-09-22 DIAGNOSIS — Z992 Dependence on renal dialysis: Secondary | ICD-10-CM | POA: Diagnosis not present

## 2018-09-22 IMAGING — CT CT CHEST W/O CM
2 of 4 series · 15 of 36 positions shown, 18 images · non-contrast
Comparison: Plain films 09/10/2016. No prior chest CT. Abdominal CT
of 08/03/2016 reviewed.

CLINICAL DATA: Pulmonary nodule. Ascending aortic enlargement.
aneurysm.

EXAM:
CT CHEST WITHOUT CONTRAST
TECHNIQUE: Multidetector CT imaging of the chest was performed following the
standard protocol without IV contrast.

[Series 3: chest w/o · axial · non-contrast · 0.75mm/px · z∈[-344,-42]mm · 12 of 145 slices shown, 15 images]
[im 12/145  mediastinal]
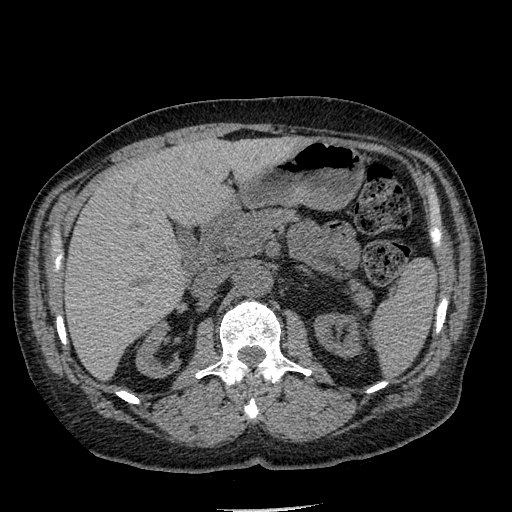
[im 12/145  lung]
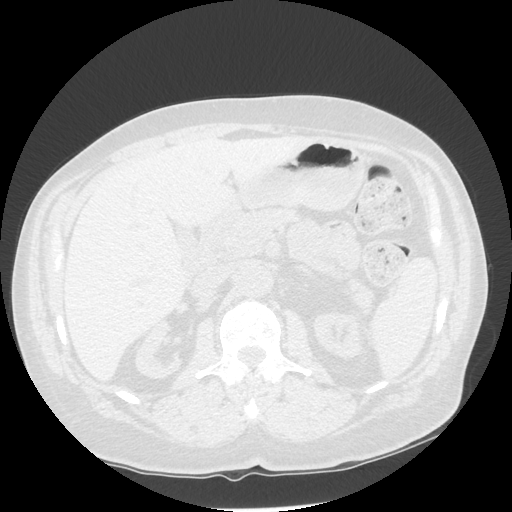
[im 23/145  lung]
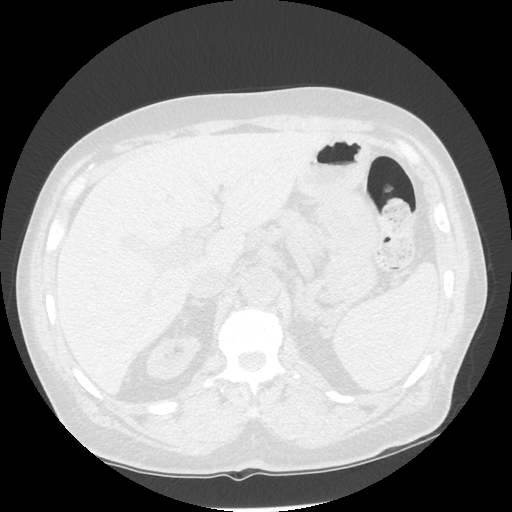
[im 34/145  lung]
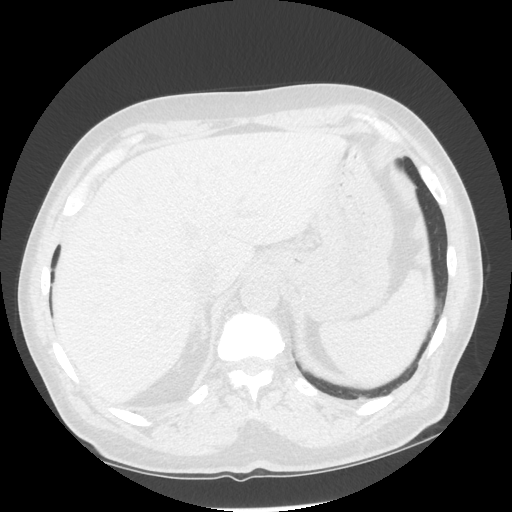
[im 45/145  lung]
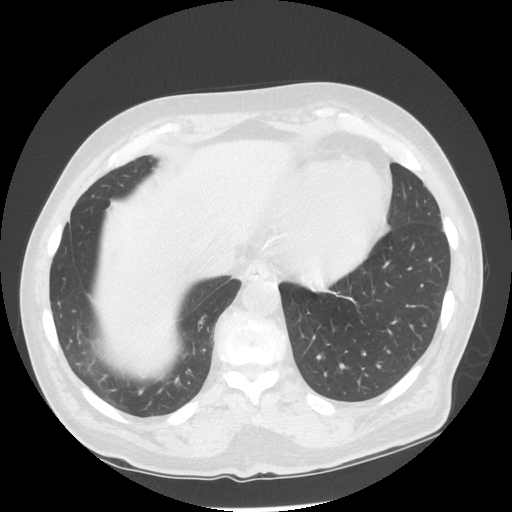
[im 56/145  mediastinal]
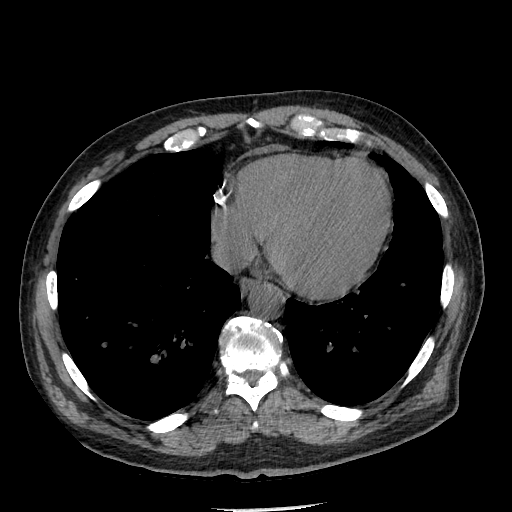
[im 56/145  lung]
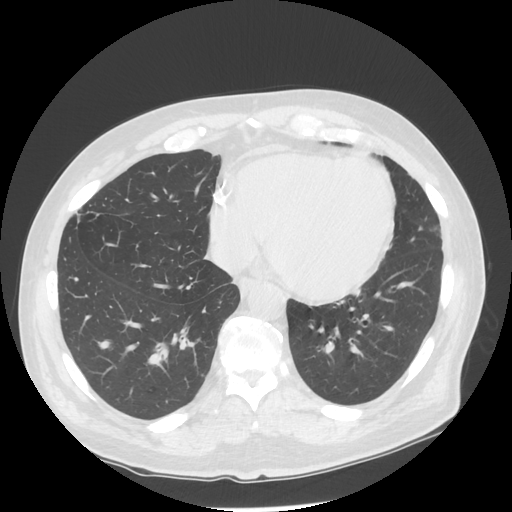
[im 67/145  lung]
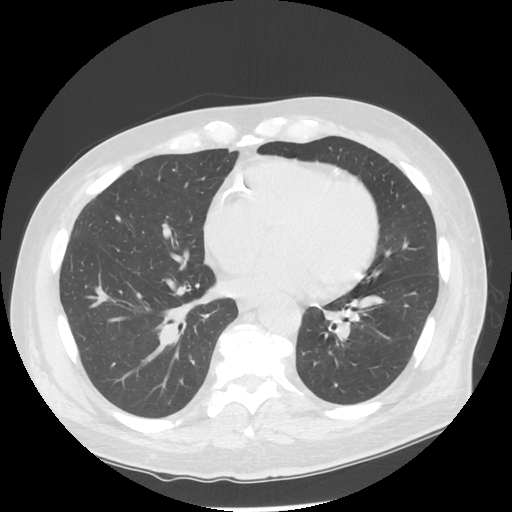
[im 78/145  lung]
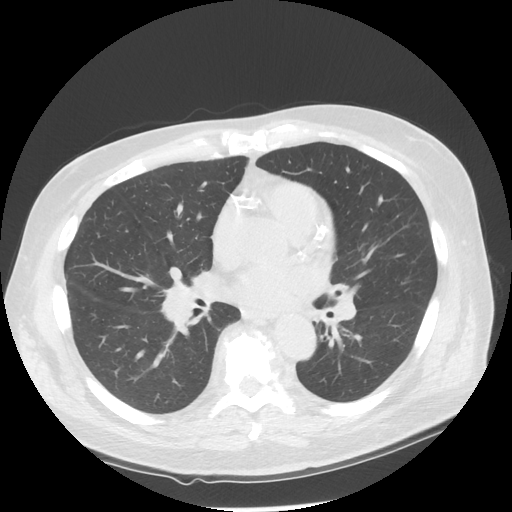
[im 89/145  lung]
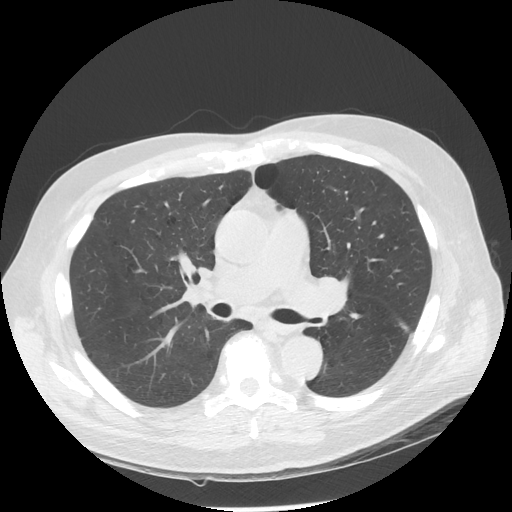
[im 100/145  mediastinal]
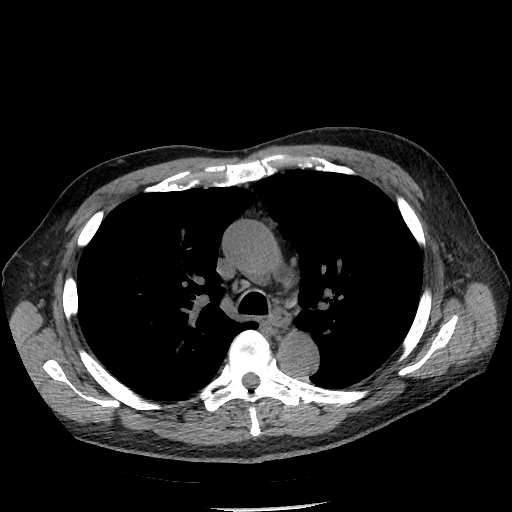
[im 100/145  lung]
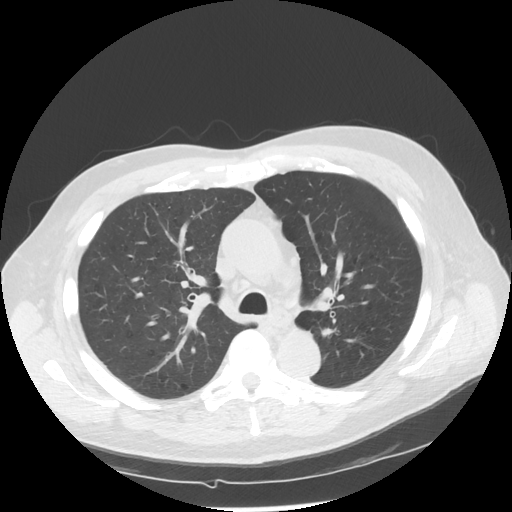
[im 111/145  lung]
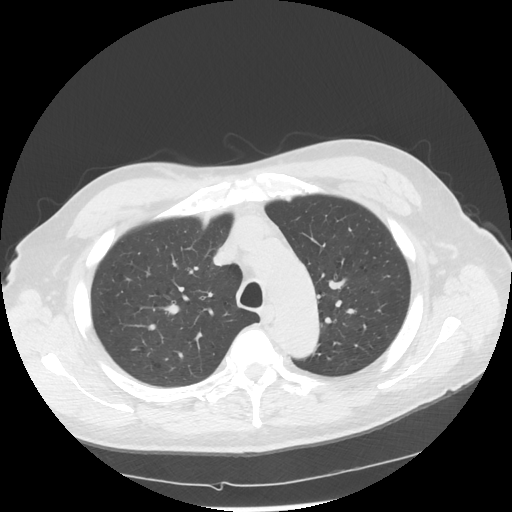
[im 122/145  lung]
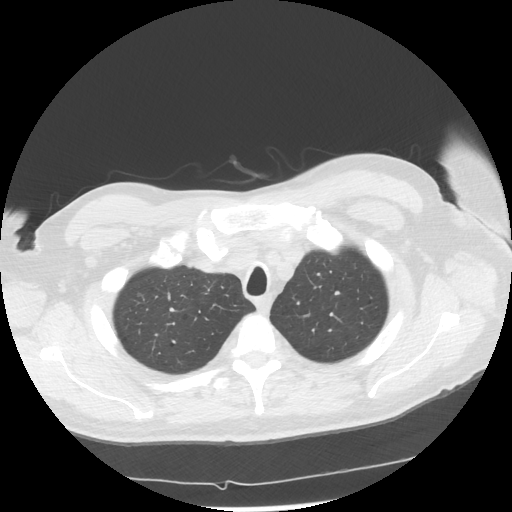
[im 133/145  lung]
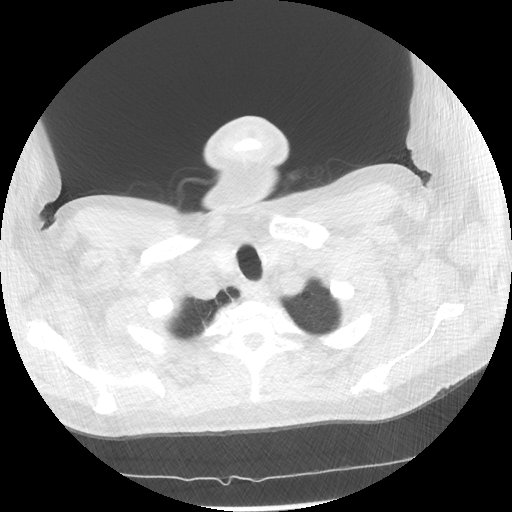

[Series 200: coronal · coronal · 0.74mm/px · 3 of 100 slices shown]
[im 20/100  lung]
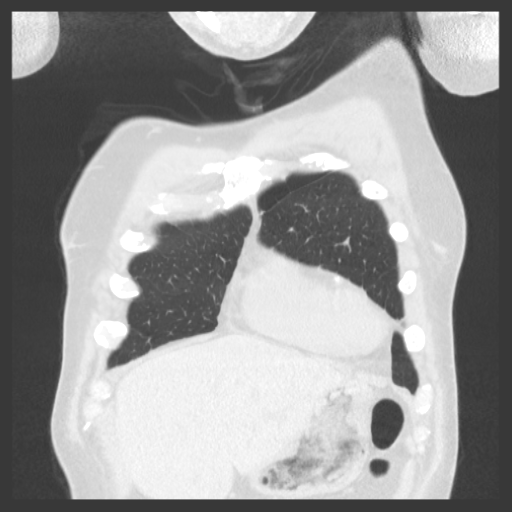
[im 40/100  lung]
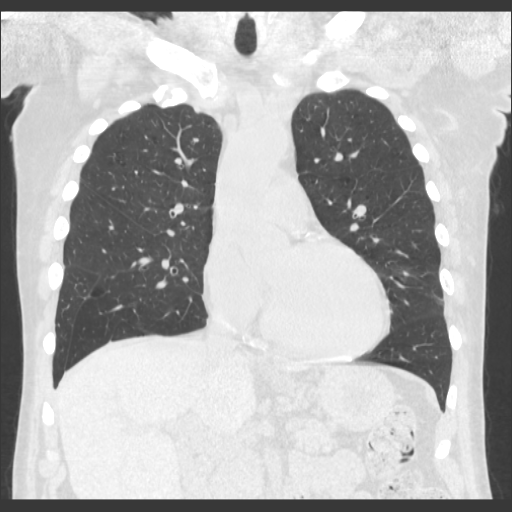
[im 60/100  lung]
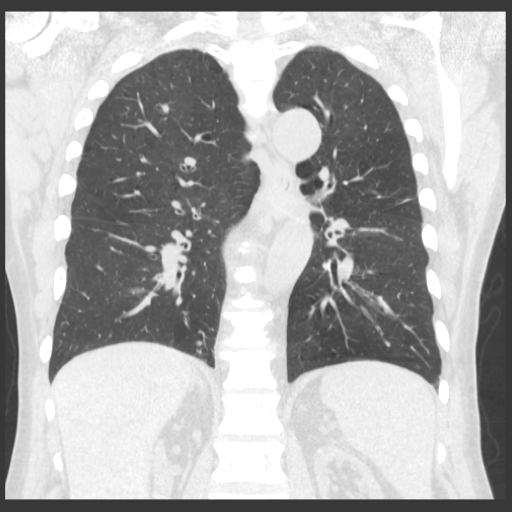

[15 of 36 positions shown; findings below may reference images not displayed]

FINDINGS: Cardiovascular: Normal caliber of the great vessels. Aortic
atherosclerosis. These ascending aorta measures maximally 3.8 cm on
image 49/series 3. 3.9 cm on coronal image 40. Normal caliber of the
transverse and descending segments. Mild tortuosity involves the
descending segment. Mild cardiomegaly with multivessel coronary
artery atherosclerosis.

Mediastinum/Nodes: No mediastinal or definite hilar adenopathy,
given limitations of unenhanced CT.

Lungs/Pleura: No pleural fluid. Mild centrilobular emphysema. Lower
lobe predominant bronchial wall thickening.

Right lower lobe pulmonary nodule is similar at 7 mm on image
9/series 4. More lateral right lower lobe nodule on the prior exam
has resolved and may have represented an area of atelectasis.

3 mm subpleural right lower lobe nodule on image 89/series 4 is
similar.

Subpleural left upper lobe 4 mm nodule on image 66/series 4 was
excluded on the prior.

A left lower lobe pulmonary nodule measures 10 x 9 mm on image
94/series 4. This is similar to minimally enlarged compared to 9 x 9
mm on the prior.

Upper Abdomen: Normal imaged portions of the liver, spleen, stomach,
pancreas, adrenal glands. Moderate bilateral renal atrophy. 4 mm
gallstone.

Musculoskeletal: Moderate thoracic spondylosis.
IMPRESSION: 1. Bilateral pulmonary nodules. The largest nodule, in the left
lower lobe, is similar to minimally enlarged at maximally 10 mm.
Potential clinical strategies include three-month follow-up CT or a
more aggressive approach with PET.
2. No evidence of aortic aneurysm. Upper normal aortic size, as
detailed above.
3. Coronary artery atherosclerosis. Aortic Atherosclerosis
([id number]).
4. Cholelithiasis.
5. Renal atrophy.

## 2018-09-22 NOTE — Telephone Encounter (Signed)
Requesting to speak with Jeffrey [name], please call pt back.  

## 2018-09-22 NOTE — Telephone Encounter (Signed)
Spoke w/ pt's caregiver, states the ID clinic stated pt had flu vaccine in aug 2019, do not see it charted, ask him to call ID back and get the date and ask for documentation. He is agreeable

## 2018-09-22 NOTE — Telephone Encounter (Signed)
Calling back to speak with Bonnita Nasuti, please call back.

## 2018-09-26 DIAGNOSIS — N186 End stage renal disease: Secondary | ICD-10-CM | POA: Diagnosis not present

## 2018-09-26 DIAGNOSIS — N2581 Secondary hyperparathyroidism of renal origin: Secondary | ICD-10-CM | POA: Diagnosis not present

## 2018-09-26 DIAGNOSIS — Z992 Dependence on renal dialysis: Secondary | ICD-10-CM | POA: Diagnosis not present

## 2018-09-26 DIAGNOSIS — D631 Anemia in chronic kidney disease: Secondary | ICD-10-CM | POA: Diagnosis not present

## 2018-09-26 DIAGNOSIS — D509 Iron deficiency anemia, unspecified: Secondary | ICD-10-CM | POA: Diagnosis not present

## 2018-09-27 DIAGNOSIS — Z48812 Encounter for surgical aftercare following surgery on the circulatory system: Secondary | ICD-10-CM | POA: Diagnosis not present

## 2018-09-27 DIAGNOSIS — D509 Iron deficiency anemia, unspecified: Secondary | ICD-10-CM | POA: Diagnosis not present

## 2018-09-27 DIAGNOSIS — I2510 Atherosclerotic heart disease of native coronary artery without angina pectoris: Secondary | ICD-10-CM | POA: Diagnosis not present

## 2018-09-27 DIAGNOSIS — I120 Hypertensive chronic kidney disease with stage 5 chronic kidney disease or end stage renal disease: Secondary | ICD-10-CM | POA: Diagnosis not present

## 2018-09-27 DIAGNOSIS — D631 Anemia in chronic kidney disease: Secondary | ICD-10-CM | POA: Diagnosis not present

## 2018-09-27 DIAGNOSIS — N2581 Secondary hyperparathyroidism of renal origin: Secondary | ICD-10-CM | POA: Diagnosis not present

## 2018-09-27 DIAGNOSIS — Z992 Dependence on renal dialysis: Secondary | ICD-10-CM | POA: Diagnosis not present

## 2018-09-27 DIAGNOSIS — J449 Chronic obstructive pulmonary disease, unspecified: Secondary | ICD-10-CM | POA: Diagnosis not present

## 2018-09-27 DIAGNOSIS — N186 End stage renal disease: Secondary | ICD-10-CM | POA: Diagnosis not present

## 2018-09-29 DIAGNOSIS — D509 Iron deficiency anemia, unspecified: Secondary | ICD-10-CM | POA: Diagnosis not present

## 2018-09-29 DIAGNOSIS — N186 End stage renal disease: Secondary | ICD-10-CM | POA: Diagnosis not present

## 2018-09-29 DIAGNOSIS — D631 Anemia in chronic kidney disease: Secondary | ICD-10-CM | POA: Diagnosis not present

## 2018-09-29 DIAGNOSIS — Z992 Dependence on renal dialysis: Secondary | ICD-10-CM | POA: Diagnosis not present

## 2018-09-29 DIAGNOSIS — N2581 Secondary hyperparathyroidism of renal origin: Secondary | ICD-10-CM | POA: Diagnosis not present

## 2018-09-29 DIAGNOSIS — I120 Hypertensive chronic kidney disease with stage 5 chronic kidney disease or end stage renal disease: Secondary | ICD-10-CM | POA: Diagnosis not present

## 2018-09-29 DIAGNOSIS — I2510 Atherosclerotic heart disease of native coronary artery without angina pectoris: Secondary | ICD-10-CM | POA: Diagnosis not present

## 2018-09-29 DIAGNOSIS — J449 Chronic obstructive pulmonary disease, unspecified: Secondary | ICD-10-CM | POA: Diagnosis not present

## 2018-09-29 DIAGNOSIS — Z48812 Encounter for surgical aftercare following surgery on the circulatory system: Secondary | ICD-10-CM | POA: Diagnosis not present

## 2018-09-30 MED FILL — ETHYL CHLORIDE SPRAY: 25 days supply | Qty: 104 | Fill #0

## 2018-10-03 ENCOUNTER — Other Ambulatory Visit: Payer: Self-pay | Admitting: Adult Health

## 2018-10-03 DIAGNOSIS — D631 Anemia in chronic kidney disease: Secondary | ICD-10-CM | POA: Diagnosis not present

## 2018-10-03 DIAGNOSIS — Z992 Dependence on renal dialysis: Secondary | ICD-10-CM | POA: Diagnosis not present

## 2018-10-03 DIAGNOSIS — D509 Iron deficiency anemia, unspecified: Secondary | ICD-10-CM | POA: Diagnosis not present

## 2018-10-03 DIAGNOSIS — I10 Essential (primary) hypertension: Secondary | ICD-10-CM

## 2018-10-03 DIAGNOSIS — B20 Human immunodeficiency virus [HIV] disease: Secondary | ICD-10-CM

## 2018-10-03 DIAGNOSIS — N186 End stage renal disease: Secondary | ICD-10-CM | POA: Diagnosis not present

## 2018-10-03 DIAGNOSIS — N2581 Secondary hyperparathyroidism of renal origin: Secondary | ICD-10-CM | POA: Diagnosis not present

## 2018-10-04 ENCOUNTER — Ambulatory Visit (HOSPITAL_COMMUNITY)
Admission: RE | Admit: 2018-10-04 | Discharge: 2018-10-04 | Disposition: A | Discharge status: Home / Self Care | Payer: Medicare Other | Source: Ambulatory Visit | Attending: Thoracic Surgery (Cardiothoracic Vascular Surgery) | Admitting: Thoracic Surgery (Cardiothoracic Vascular Surgery)

## 2018-10-04 ENCOUNTER — Other Ambulatory Visit (HOSPITAL_COMMUNITY): Payer: Self-pay | Admitting: Radiology

## 2018-10-04 ENCOUNTER — Encounter (HOSPITAL_COMMUNITY): Payer: Self-pay | Admitting: Physician Assistant

## 2018-10-04 ENCOUNTER — Ambulatory Visit (HOSPITAL_COMMUNITY)
Admission: RE | Admit: 2018-10-04 | Discharge: 2018-10-04 | Disposition: A | Discharge status: Home / Self Care | Payer: Medicare Other | Source: Ambulatory Visit | Attending: Radiology | Admitting: Radiology

## 2018-10-04 DIAGNOSIS — J449 Chronic obstructive pulmonary disease, unspecified: Secondary | ICD-10-CM | POA: Diagnosis not present

## 2018-10-04 DIAGNOSIS — Z48812 Encounter for surgical aftercare following surgery on the circulatory system: Secondary | ICD-10-CM | POA: Diagnosis not present

## 2018-10-04 DIAGNOSIS — J90 Pleural effusion, not elsewhere classified: Secondary | ICD-10-CM | POA: Insufficient documentation

## 2018-10-04 DIAGNOSIS — D631 Anemia in chronic kidney disease: Secondary | ICD-10-CM | POA: Diagnosis not present

## 2018-10-04 DIAGNOSIS — D509 Iron deficiency anemia, unspecified: Secondary | ICD-10-CM | POA: Diagnosis not present

## 2018-10-04 DIAGNOSIS — I2510 Atherosclerotic heart disease of native coronary artery without angina pectoris: Secondary | ICD-10-CM | POA: Diagnosis not present

## 2018-10-04 DIAGNOSIS — Z992 Dependence on renal dialysis: Secondary | ICD-10-CM | POA: Diagnosis not present

## 2018-10-04 DIAGNOSIS — I120 Hypertensive chronic kidney disease with stage 5 chronic kidney disease or end stage renal disease: Secondary | ICD-10-CM | POA: Diagnosis not present

## 2018-10-04 DIAGNOSIS — Z9889 Other specified postprocedural states: Secondary | ICD-10-CM

## 2018-10-04 DIAGNOSIS — N186 End stage renal disease: Secondary | ICD-10-CM | POA: Diagnosis not present

## 2018-10-04 DIAGNOSIS — N2581 Secondary hyperparathyroidism of renal origin: Secondary | ICD-10-CM | POA: Diagnosis not present

## 2018-10-04 HISTORY — PX: IR THORACENTESIS ASP PLEURAL SPACE W/IMG GUIDE: IMG5380

## 2018-10-04 MED ORDER — LIDOCAINE HCL 1 % IJ SOLN
INTRAMUSCULAR | Status: AC
  Filled 2018-10-04: qty 20

## 2018-10-04 MED ORDER — LIDOCAINE HCL (PF) 1 % IJ SOLN
INTRAMUSCULAR | Status: DC | PRN
  Administered 2018-10-04: 10 mL

## 2018-10-04 NOTE — Procedures (Signed)
PROCEDURE SUMMARY:  Successful US guided left thoracentesis. Yielded 1 L of clear amber fluid. Pt c/o cough and chest tightness, procedure was stopped at this point. No immediate complications.  Specimen was not sent for labs. CXR ordered.  EBL < 5 mL  Ascencion Dike PA-C 10/04/2018 11:34 AM

## 2018-10-06 ENCOUNTER — Encounter: Payer: Self-pay | Admitting: Neurology

## 2018-10-06 ENCOUNTER — Ambulatory Visit (INDEPENDENT_AMBULATORY_CARE_PROVIDER_SITE_OTHER): Payer: Medicare Other | Admitting: Neurology

## 2018-10-06 DIAGNOSIS — D509 Iron deficiency anemia, unspecified: Secondary | ICD-10-CM | POA: Diagnosis not present

## 2018-10-06 DIAGNOSIS — N2581 Secondary hyperparathyroidism of renal origin: Secondary | ICD-10-CM | POA: Diagnosis not present

## 2018-10-06 DIAGNOSIS — N186 End stage renal disease: Secondary | ICD-10-CM | POA: Diagnosis not present

## 2018-10-06 DIAGNOSIS — Z992 Dependence on renal dialysis: Secondary | ICD-10-CM | POA: Diagnosis not present

## 2018-10-06 DIAGNOSIS — G40909 Epilepsy, unspecified, not intractable, without status epilepticus: Secondary | ICD-10-CM

## 2018-10-06 DIAGNOSIS — D631 Anemia in chronic kidney disease: Secondary | ICD-10-CM | POA: Diagnosis not present

## 2018-10-06 MED ORDER — LACOSAMIDE 150 MG PO TABS: 1.0000 | ORAL_TABLET | Freq: Two times a day (BID) | ORAL | 11 refills | Status: DC

## 2018-10-06 NOTE — Progress Notes (Signed)
PATIENT: Jeffrey [name] DOB: Dec 25, 1955  Chief Complaint  Patient presents with  . Seizures    He is here with his brother, [name].  Reports his last seizure was in October 2019.  He had an aortic valve replacement on 07/24/18.  His PCP increased his Vimpat 150mg , one tablet BID in December.  He is tolerating this does without adverse effects.       HISTORICAL  Jeffrey [name] is a 63 years old male, seen in request by his primary care physician Dr. Koleen Distance, Nila Nephew, and Dr. Velna Ochs for evaluation of seizure, he is accompanied by his brother [name] at today's clinical visit.  Initial evaluation was on June 06, 2018.  I have reviewed and summarized the referring note from the referring physician, he has past medical history of end-stage renal disease on dialysis, HIV, on antivirus treatment with undetectable virus load, anemia, hepatitis B, endocarditis, he is getting home dialysis by his brother 3 times a week since 2012,  First seizure was on June 01, 2011, generalized tonic-clonic seizure, was considered due to worsening end-stage dialysis, was treated with hemodialysis,  Second seizure was in June 2019, he was initially treated with Keppra, which was discontinued due to thrombocytopenia, but with negative heat antibody, Keppra is most likely the culprit.  Third seizure was in July 2019, was considered due to rapid hemodynamic shifting, brother reported to much fluid was taken out,  He was treated with Vimpat 75 mg twice a day since August 2019, with extra 25 mg after each dialysis,  For seizure was on June 04, 2018, he had sudden onset black staring, with post event confusion, fatigue,  He also had a witnessed episode at today's visit on June 06, 2018, he had transient stairs, unresponsive, then complains of fatigue, he did not sleep well last night, also complains of nausea, generalized weakness.  Echocardiogram on May 03, 2018, left ventricular apex is  trabeculated, there is a false tendon in the LV apex, there was severe concentric hypertrophy, ejection fraction was 55 to 60%, wall motion was normal, aortic valve showed moderate to severe regurgitation, left atrium was severely dilated, mild dilated right ventricular size, left pleural effusion and trivial pericardial effusion posteriorly,  Personally reviewed MRI of the brain without contrast on April 11, 2018, no acute abnormality, stable mild generalized atrophy, supratentorium small vessel disease.  MRI of thoracic spine: Degenerative changes, no significant canal foraminal stenosis, left paracentral disc protrusion at C7-T1 MRI of lumbar showed stable appearance of lumbar spine, degenerative disc disease at L4-5, L5-S1, there was no acute abnormality, mild edema in the paraspinal muscle,  UPDATE Jan 16th 2020: He is accompanied by his brother [name] at visit, he had AV replacement on Jul 24 2018,  Last seizure was in Oct 2019, he stretched out, seized, had bowel movement lasting 10-15 minutes, now he is on vimpat 150mg  bid, he feel sleep, tired all the time.  He also has pleural effusion procedure recently.  EEG showed generalized slowing.  REVIEW OF SYSTEMS: Full 14 system review of systems performed and notable only for as above   all other review of systems were negative.  ALLERGIES: Allergies  Allergen Reactions  . Lisinopril Anaphylaxis and Shortness Of Breath    Throat swelling  . Penicillins Anaphylaxis and Other (See Comments)    Childhood allergy Has patient had a PCN reaction causing immediate rash, facial/tongue/throat swelling, SOB or lightheadedness with hypotension: Yes Has patient had a PCN reaction causing severe  rash involving mucus membranes or skin necrosis: Unk Has patient had a PCN reaction that required hospitalization: Unk Has patient had a PCN reaction occurring within the last 10 years: No If all of the above answers are "NO", then may proceed with  Cephalosporin use.   . Fentanyl Other (See Comments)    Lethargy, AMS  . Morphine And Related Other (See Comments)    "Not Himself"    HOME MEDICATIONS: Current Outpatient Medications  Medication Sig Dispense Refill  . acetaminophen (TYLENOL) 325 MG tablet Take 2 tablets (650 mg total) by mouth every 6 (six) hours as needed for fever, headache, mild pain or moderate pain.    Marland Kitchen albuterol (PROVENTIL) (2.5 MG/3ML) 0.083% nebulizer solution Take 3 mLs (2.5 mg total) by nebulization every 6 (six) hours as needed for wheezing or shortness of breath. 360 mL 0  . Amino Acids-Protein Hydrolys (FEEDING SUPPLEMENT, PRO-STAT SUGAR FREE 64,) LIQD Take 30 mLs by mouth 2 (two) times daily. 900 mL 0  . amLODipine (NORVASC) 10 MG tablet Take 1 tablet (10 mg total) by mouth daily. 30 tablet 0  . aspirin EC 325 MG EC tablet Take 1 tablet (325 mg total) by mouth daily. 30 tablet 0  . bictegravir-emtricitabine-tenofovir AF (BIKTARVY) 50-200-25 MG TABS tablet Take 1 tablet by mouth daily. 30 tablet 0  . calcium acetate (PHOSLO) 667 MG capsule Take 2-3 capsules (1,334-2,001 mg total) by mouth See admin instructions. Take 2,001 mg three times a day with each meal and 1,334 mg with each snack 390 capsule 0  . cloNIDine (CATAPRES) 0.3 MG tablet Take 1 tablet (0.3 mg total) by mouth 3 (three) times daily. 90 tablet 0  . ferric gluconate 125 mg in sodium chloride 0.9 % 100 mL Inject 125 mg into the vein every Monday, Wednesday, and Friday with hemodialysis.    . hydrALAZINE (APRESOLINE) 100 MG tablet Take 1 tablet (100 mg total) by mouth 3 (three) times daily. 90 tablet 0  . Ipratropium-Albuterol (COMBIVENT) 20-100 MCG/ACT AERS respimat Inhale 2 puffs into the lungs every 6 (six) hours as needed for wheezing (SOB). 4 g 0  . isosorbide mononitrate (ISMO,MONOKET) 20 MG tablet Take 1 tablet (20 mg total) by mouth 2 (two) times daily at 10 AM and 5 PM. 60 tablet 0  . Lacosamide (VIMPAT) 150 MG TABS Take 1 tablet (150 mg  total) by mouth 2 (two) times daily. 60 tablet 0  . lactulose (CHRONULAC) 10 GM/15ML solution Take 30 mLs (20 g total) by mouth daily. 236 mL 0  . multivitamin (RENA-VIT) TABS tablet Take 1 tablet by mouth daily. 30 tablet 0  . omeprazole (PRILOSEC) 20 MG capsule Take 1 capsule (20 mg total) by mouth daily. 30 capsule 0  . OXYGEN Inhale 2 L/min into the lungs as needed (Hypoxia).    . traMADol (ULTRAM) 50 MG tablet Take 1 tablet (50 mg total) by mouth every 12 (twelve) hours as needed for moderate pain. 14 tablet 0  . traZODone (DESYREL) 50 MG tablet Take 1 tablet (50 mg total) by mouth at bedtime as needed for sleep. 30 tablet 0   No current facility-administered medications for this visit.    Facility-Administered Medications Ordered in Other Visits  Medication Dose Route Frequency Provider Last Rate Last Dose  . etomidate (AMIDATE) injection    Anesthesia Intra-op Myna Bright, CRNA   16 mg at 07/10/18 2031  . rocuronium bromide 10 mg/mL (PF) syringe   Intravenous Anesthesia Intra-op Myna Bright, CRNA  50 mg at 07/10/18 2031    PAST MEDICAL HISTORY: Past Medical History:  Diagnosis Date  . Anemia   . Anxiety   . Aortic valve stenosis   . Arthritis   . Asthma    per pt hx  . Dyspnea   . End stage renal disease on home HD 07/10/2011   Started HD in September 2012 at [facility] with a tunneled HD catheter, now on home HD with NxtStage. Dialyzing through AVF L lower arm with buttonhole technique as of mid 2014. His brother does the HD treatments at home.  They are roommates for 23 years.  The brother works 3rd shift and gets off about 8am and then puts Mr Dalsanto on HD in the morning after getting home. Most of the time he does HD about 4 times a week, for about 4 hours per treatment. Cause of ESRD was HTN according to patient. He says he let his health go and ending up with complications, and that he didn't like seeing doctors in those days.  He says he was diagnosed with severe HTN  when he lived in [place] in his 55's.   . Hepatitis B carrier ([place])   . HIV infection ([place])   . Hypertension   . Hyperthyroidism    normal now  . Pneumonia several yrs ago  . Seizures ([place]) 06/02/2011   had a mild one in Sept. 2019  . Sleep apnea    to start Cpap soon  . Thrombocytopenia ([place])     PAST SURGICAL HISTORY: Past Surgical History:  Procedure Laterality Date  . AORTIC VALVE REPLACEMENT N/A 07/28/2018   Procedure: AORTIC VALVE REPLACEMENT (AVR) using Inspiris Resilia  size 23 Aortic Valve.;  Surgeon: [name], MD;  Location: [facility];  Service: Open Heart Surgery;  Laterality: N/A;  . AV FISTULA PLACEMENT  06/02/11   Left radiocephalic AVF  . BIOPSY  02/17/2018   Procedure: BIOPSY;  Surgeon: Milus Banister, MD;  Location: [facility] END[facility];  Service: Endoscopy;;  . COLONOSCOPY    . COLONOSCOPY WITH PROPOFOL N/A 01/27/2018   Procedure: COLONOSCOPY WITH PROPOFOL;  Surgeon: [name], MD;  Location: [facility] ENDOSCOPY;  Service: Gastroenterology;  Laterality: N/A;  . CORONARY ANGIOPLASTY    . ERCP  03/21/2018   AT [place]  . ESOPHAGOGASTRODUODENOSCOPY N/A 02/17/2018   Procedure: ESOPHAGOGASTRODUODENOSCOPY (EGD);  Surgeon: Milus Banister, MD;  Location: [facility];  Service: Endoscopy;  Laterality: N/A;  . ESOPHAGOGASTRODUODENOSCOPY (EGD) WITH PROPOFOL N/A 01/27/2018   Procedure: ESOPHAGOGASTRODUODENOSCOPY (EGD) WITH PROPOFOL;  Surgeon: [name], MD;  Location: [facility] ENDOSCOPY;  Service: Gastroenterology;  Laterality: N/A;  . ESOPHAGOGASTRODUODENOSCOPY (EGD) WITH PROPOFOL N/A 04/03/2018   Procedure: ESOPHAGOGASTRODUODENOSCOPY (EGD) WITH PROPOFOL;  Surgeon: Clarene Essex, MD;  Location: [facility];  Service: Endoscopy;  Laterality: N/A;  . EUS N/A 02/17/2018   Procedure: UPPER ENDOSCOPIC ULTRASOUND (EUS) RADIAL;  Surgeon: Milus Banister, MD;  Location: [facility];  Service: Endoscopy;  Laterality: N/A;  . fistulaogram     x 2 last 2 years  . IR DIALY SHUNT  INTRO NEEDLE/INTRACATH INITIAL W/IMG LEFT Left 04/20/2018  . IR FLUORO GUIDE CV LINE RIGHT  09/16/2017  . IR THORACENTESIS ASP PLEURAL SPACE W/IMG GUIDE  10/04/2018  . IR US GUIDE VASC ACCESS RIGHT  09/16/2017  . IRRIGATION AND DEBRIDEMENT KNEE Left 09/15/2017   Procedure: IRRIGATION AND DEBRIDEMENT KNEE; arthroscopic clean out;  Surgeon: [name], MD;  Location: [facility];  Service: Orthopedics;  Laterality: Left;  .  OTHER SURGICAL HISTORY     removal temporary HD catheter   . REVISON OF ARTERIOVENOUS FISTULA Left 10/10/2015   Procedure: REVISON OF LEFT RADIOCEPHALIC ARTERIOVENOUS FISTULA;  Surgeon: Angelia Mould, MD;  Location: [facility];  Service: Vascular;  Laterality: Left;  . REVISON OF ARTERIOVENOUS FISTULA Left 02/07/2016   Procedure: REPAIR OF PSEUDO-ANEUREYSM OF LEFT ARM  ARTERIOVENOUS FISTULA;  Surgeon: Angelia Mould, MD;  Location: [facility];  Service: Vascular;  Laterality: Left;  . REVISON OF ARTERIOVENOUS FISTULA Left 3/71/0626   Procedure: PLICATION OF LEFT ARM RADIOCEPHALIC ARTERIOVENOUS FISTULA PSEUDOANEURYSM;  Surgeon: Angelia Mould, MD;  Location: [facility];  Service: Vascular;  Laterality: Left;  . REVISON OF ARTERIOVENOUS FISTULA Left 07/04/2018   Procedure: REVISION OF ARTERIOVENOUS RADIOCEPHALIC FISTULA;  Surgeon: Angelia Mould, MD;  Location: [facility];  Service: Vascular;  Laterality: Left;  . RIGHT/LEFT HEART CATH AND CORONARY ANGIOGRAPHY N/A 02/17/2017   Procedure: Right/Left Heart Cath and Coronary Angiography;  Surgeon: Sherren Mocha, MD;  Location: [facility];  Service: Cardiovascular;  Laterality: N/A;  . RIGHT/LEFT HEART CATH AND CORONARY ANGIOGRAPHY N/A 07/25/2018   Procedure: RIGHT/LEFT HEART CATH AND CORONARY ANGIOGRAPHY;  Surgeon: Leonie Man, MD;  Location: [facility];  Service: Cardiovascular;  Laterality: N/A;  . TEE WITHOUT CARDIOVERSION N/A 09/11/2016   Procedure: TRANSESOPHAGEAL ECHOCARDIOGRAM (TEE);  Surgeon:  Dorothy Spark, MD;  Location: [facility];  Service: Cardiovascular;  Laterality: N/A;  . TEE WITHOUT CARDIOVERSION N/A 05/05/2018   Procedure: TRANSESOPHAGEAL ECHOCARDIOGRAM (TEE);  Surgeon: Adrian Prows, MD;  Location: [facility];  Service: Cardiovascular;  Laterality: N/A;  Prefer after 3:30 PM  . TEE WITHOUT CARDIOVERSION N/A 07/28/2018   Procedure: TRANSESOPHAGEAL ECHOCARDIOGRAM (TEE);  Surgeon: [name], MD;  Location: [facility];  Service: Open Heart Surgery;  Laterality: N/A;    FAMILY HISTORY: Family History  Problem Relation Age of Onset  . Hypertension Mother   . Diabetes Mother   . Cancer Father     SOCIAL HISTORY: Social History   Socioeconomic History  . Marital status: Single    Spouse name: Not on file  . Number of children: Not on file  . Years of education: Not on file  . Highest education level: Not on file  Occupational History  . Not on file  Social Needs  . Financial resource strain: Not on file  . Food insecurity:    Worry: Not on file    Inability: Not on file  . Transportation needs:    Medical: Not on file    Non-medical: Not on file  Tobacco Use  . Smoking status: Former Smoker    Years: 10.00    Types: Cigarettes    Last attempt to quit: 08/08/2015    Years since quitting: 3.1  . Smokeless tobacco: Never Used  . Tobacco comment: casual smoking for 10 years  Substance and Sexual Activity  . Alcohol use: No    Alcohol/week: 0.0 standard drinks    Comment: quit 2004  . Drug use: No    Comment: uds (+) cocaine in 08/2011 but pt states was taking sudafed at the time  . Sexual activity: Not Currently    Partners: Male  Lifestyle  . Physical activity:    Days per week: Not on file    Minutes per session: Not on file  . Stress: Not on file  Relationships  . Social connections:    Talks on phone: Not on file  Gets together: Not on file    Attends religious service: Not on file    Active member of club or organization: Not on  file    Attends meetings of clubs or organizations: Not on file    Relationship status: Not on file  . Intimate partner violence:    Fear of current or ex partner: Not on file    Emotionally abused: Not on file    Physically abused: Not on file    Forced sexual activity: Not on file  Other Topics Concern  . 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  PHYSICAL EXAMNIATION:  Gen: NAD, conversant, well nourised, obese, well groomed                     Cardiovascular: Regular rate rhythm, no peripheral edema, warm, nontender. Eyes: Conjunctivae clear without exudates or hemorrhage Neck: Supple, no carotid bruits. Pulmonary: Clear to auscultation bilaterally   NEUROLOGICAL EXAM:  MENTAL STATUS: Speech:    Speech is normal; fluent and spontaneous with normal comprehension.  Cognition:     Orientation to time, place and person     Normal recent and remote memory     Normal Attention span and concentration     Normal Language, naming, repeating,spontaneous speech     Fund of knowledge   CRANIAL NERVES: CN II: Visual fields are full to confrontation.  Pupils are round equal and briskly reactive to light. CN III, IV, VI: extraocular movement are normal. No ptosis. CN V: Facial sensation is intact to pinprick in all 3 divisions bilaterally. Corneal responses are intact.  CN VII: Face is symmetric with normal eye closure and smile. CN VIII: Hearing is normal to rubbing fingers CN IX, X: Palate elevates symmetrically. Phonation is normal. CN XI: Head turning and shoulder shrug are intact CN XII: Tongue is midline with normal movements and no  atrophy.  MOTOR: Limited range of motion of left shoulder  REFLEXES: Reflexes are 2+ and symmetric at the biceps, triceps, knees, and ankles. Plantar responses are flexor.  SENSORY: Ms. dependent decreased light touch, vibratory sensation  COORDINATION: Rapid alternating movements and fine finger movements are intact. There is no dysmetria on finger-to-nose and heel-knee-shin.    GAIT/STANCE: Deferred   DIAGNOSTIC DATA (LABS, IMAGING, TESTING) - I reviewed patient records, labs, notes, testing and imaging myself where available.   ASSESSMENT AND PLAN  NICHOALS HEYDE is a 63 y.o. male   ESRD on hemodialysis Seizure  EEG showed generalized slowing  Vimpat 150 mg twice a day  He complains of generalized fatigue, sleepiness, which is likely multifactorial, his medical condition, medication side effect, I have offered a potential option of lamotrigine ER once a day, after discussion, we decided to stay on current dose,  Return to clinic in 6 months with Thea Alken, M.D. Ph.D.  [facility] [address] [place],  [address] Ph: [phone] Fax: [phone]  CC: Delice Bison, DO

## 2018-10-07 DIAGNOSIS — J449 Chronic obstructive pulmonary disease, unspecified: Secondary | ICD-10-CM | POA: Diagnosis not present

## 2018-10-07 DIAGNOSIS — I120 Hypertensive chronic kidney disease with stage 5 chronic kidney disease or end stage renal disease: Secondary | ICD-10-CM | POA: Diagnosis not present

## 2018-10-07 DIAGNOSIS — N186 End stage renal disease: Secondary | ICD-10-CM | POA: Diagnosis not present

## 2018-10-07 DIAGNOSIS — I2510 Atherosclerotic heart disease of native coronary artery without angina pectoris: Secondary | ICD-10-CM | POA: Diagnosis not present

## 2018-10-07 DIAGNOSIS — Z48812 Encounter for surgical aftercare following surgery on the circulatory system: Secondary | ICD-10-CM | POA: Diagnosis not present

## 2018-10-07 DIAGNOSIS — D631 Anemia in chronic kidney disease: Secondary | ICD-10-CM | POA: Diagnosis not present

## 2018-10-10 DIAGNOSIS — D509 Iron deficiency anemia, unspecified: Secondary | ICD-10-CM | POA: Diagnosis not present

## 2018-10-10 DIAGNOSIS — D631 Anemia in chronic kidney disease: Secondary | ICD-10-CM | POA: Diagnosis not present

## 2018-10-10 DIAGNOSIS — Z992 Dependence on renal dialysis: Secondary | ICD-10-CM | POA: Diagnosis not present

## 2018-10-10 DIAGNOSIS — N186 End stage renal disease: Secondary | ICD-10-CM | POA: Diagnosis not present

## 2018-10-10 DIAGNOSIS — N2581 Secondary hyperparathyroidism of renal origin: Secondary | ICD-10-CM | POA: Diagnosis not present

## 2018-10-11 DIAGNOSIS — D631 Anemia in chronic kidney disease: Secondary | ICD-10-CM | POA: Diagnosis not present

## 2018-10-11 DIAGNOSIS — D509 Iron deficiency anemia, unspecified: Secondary | ICD-10-CM | POA: Diagnosis not present

## 2018-10-11 DIAGNOSIS — Z992 Dependence on renal dialysis: Secondary | ICD-10-CM | POA: Diagnosis not present

## 2018-10-11 DIAGNOSIS — N186 End stage renal disease: Secondary | ICD-10-CM | POA: Diagnosis not present

## 2018-10-11 DIAGNOSIS — N2581 Secondary hyperparathyroidism of renal origin: Secondary | ICD-10-CM | POA: Diagnosis not present

## 2018-10-12 DIAGNOSIS — D631 Anemia in chronic kidney disease: Secondary | ICD-10-CM | POA: Diagnosis not present

## 2018-10-12 DIAGNOSIS — Z48812 Encounter for surgical aftercare following surgery on the circulatory system: Secondary | ICD-10-CM | POA: Diagnosis not present

## 2018-10-12 DIAGNOSIS — I120 Hypertensive chronic kidney disease with stage 5 chronic kidney disease or end stage renal disease: Secondary | ICD-10-CM | POA: Diagnosis not present

## 2018-10-12 DIAGNOSIS — I2510 Atherosclerotic heart disease of native coronary artery without angina pectoris: Secondary | ICD-10-CM | POA: Diagnosis not present

## 2018-10-12 DIAGNOSIS — J449 Chronic obstructive pulmonary disease, unspecified: Secondary | ICD-10-CM | POA: Diagnosis not present

## 2018-10-12 DIAGNOSIS — N186 End stage renal disease: Secondary | ICD-10-CM | POA: Diagnosis not present

## 2018-10-13 DIAGNOSIS — D631 Anemia in chronic kidney disease: Secondary | ICD-10-CM | POA: Diagnosis not present

## 2018-10-13 DIAGNOSIS — D509 Iron deficiency anemia, unspecified: Secondary | ICD-10-CM | POA: Diagnosis not present

## 2018-10-13 DIAGNOSIS — N2581 Secondary hyperparathyroidism of renal origin: Secondary | ICD-10-CM | POA: Diagnosis not present

## 2018-10-13 DIAGNOSIS — N186 End stage renal disease: Secondary | ICD-10-CM | POA: Diagnosis not present

## 2018-10-13 DIAGNOSIS — Z992 Dependence on renal dialysis: Secondary | ICD-10-CM | POA: Diagnosis not present

## 2018-10-14 ENCOUNTER — Ambulatory Visit (INDEPENDENT_AMBULATORY_CARE_PROVIDER_SITE_OTHER): Payer: Medicare Other | Admitting: *Deleted

## 2018-10-14 ENCOUNTER — Ambulatory Visit: Payer: Medicare Other

## 2018-10-14 DIAGNOSIS — Z23 Encounter for immunization: Secondary | ICD-10-CM

## 2018-10-17 DIAGNOSIS — I2510 Atherosclerotic heart disease of native coronary artery without angina pectoris: Secondary | ICD-10-CM | POA: Diagnosis not present

## 2018-10-17 DIAGNOSIS — J449 Chronic obstructive pulmonary disease, unspecified: Secondary | ICD-10-CM | POA: Diagnosis not present

## 2018-10-17 DIAGNOSIS — N2581 Secondary hyperparathyroidism of renal origin: Secondary | ICD-10-CM | POA: Diagnosis not present

## 2018-10-17 DIAGNOSIS — D509 Iron deficiency anemia, unspecified: Secondary | ICD-10-CM | POA: Diagnosis not present

## 2018-10-17 DIAGNOSIS — D631 Anemia in chronic kidney disease: Secondary | ICD-10-CM | POA: Diagnosis not present

## 2018-10-17 DIAGNOSIS — I120 Hypertensive chronic kidney disease with stage 5 chronic kidney disease or end stage renal disease: Secondary | ICD-10-CM | POA: Diagnosis not present

## 2018-10-17 DIAGNOSIS — Z48812 Encounter for surgical aftercare following surgery on the circulatory system: Secondary | ICD-10-CM | POA: Diagnosis not present

## 2018-10-17 DIAGNOSIS — N186 End stage renal disease: Secondary | ICD-10-CM | POA: Diagnosis not present

## 2018-10-17 DIAGNOSIS — Z992 Dependence on renal dialysis: Secondary | ICD-10-CM | POA: Diagnosis not present

## 2018-10-18 DIAGNOSIS — D509 Iron deficiency anemia, unspecified: Secondary | ICD-10-CM | POA: Diagnosis not present

## 2018-10-18 DIAGNOSIS — N2581 Secondary hyperparathyroidism of renal origin: Secondary | ICD-10-CM | POA: Diagnosis not present

## 2018-10-18 DIAGNOSIS — D631 Anemia in chronic kidney disease: Secondary | ICD-10-CM | POA: Diagnosis not present

## 2018-10-18 DIAGNOSIS — Z992 Dependence on renal dialysis: Secondary | ICD-10-CM | POA: Diagnosis not present

## 2018-10-18 DIAGNOSIS — N186 End stage renal disease: Secondary | ICD-10-CM | POA: Diagnosis not present

## 2018-10-19 DIAGNOSIS — N186 End stage renal disease: Secondary | ICD-10-CM | POA: Diagnosis not present

## 2018-10-19 DIAGNOSIS — J449 Chronic obstructive pulmonary disease, unspecified: Secondary | ICD-10-CM | POA: Diagnosis not present

## 2018-10-19 DIAGNOSIS — I2510 Atherosclerotic heart disease of native coronary artery without angina pectoris: Secondary | ICD-10-CM | POA: Diagnosis not present

## 2018-10-19 DIAGNOSIS — Z48812 Encounter for surgical aftercare following surgery on the circulatory system: Secondary | ICD-10-CM | POA: Diagnosis not present

## 2018-10-19 DIAGNOSIS — I120 Hypertensive chronic kidney disease with stage 5 chronic kidney disease or end stage renal disease: Secondary | ICD-10-CM | POA: Diagnosis not present

## 2018-10-19 DIAGNOSIS — D631 Anemia in chronic kidney disease: Secondary | ICD-10-CM | POA: Diagnosis not present

## 2018-10-20 DIAGNOSIS — N2581 Secondary hyperparathyroidism of renal origin: Secondary | ICD-10-CM | POA: Diagnosis not present

## 2018-10-20 DIAGNOSIS — N186 End stage renal disease: Secondary | ICD-10-CM | POA: Diagnosis not present

## 2018-10-20 DIAGNOSIS — D509 Iron deficiency anemia, unspecified: Secondary | ICD-10-CM | POA: Diagnosis not present

## 2018-10-20 DIAGNOSIS — D631 Anemia in chronic kidney disease: Secondary | ICD-10-CM | POA: Diagnosis not present

## 2018-10-20 DIAGNOSIS — Z992 Dependence on renal dialysis: Secondary | ICD-10-CM | POA: Diagnosis not present

## 2018-10-21 DIAGNOSIS — J449 Chronic obstructive pulmonary disease, unspecified: Secondary | ICD-10-CM | POA: Diagnosis not present

## 2018-10-21 DIAGNOSIS — N186 End stage renal disease: Secondary | ICD-10-CM | POA: Diagnosis not present

## 2018-10-21 DIAGNOSIS — I120 Hypertensive chronic kidney disease with stage 5 chronic kidney disease or end stage renal disease: Secondary | ICD-10-CM | POA: Diagnosis not present

## 2018-10-21 DIAGNOSIS — D631 Anemia in chronic kidney disease: Secondary | ICD-10-CM | POA: Diagnosis not present

## 2018-10-21 DIAGNOSIS — Z992 Dependence on renal dialysis: Secondary | ICD-10-CM | POA: Diagnosis not present

## 2018-10-21 DIAGNOSIS — Z48812 Encounter for surgical aftercare following surgery on the circulatory system: Secondary | ICD-10-CM | POA: Diagnosis not present

## 2018-10-21 DIAGNOSIS — I2510 Atherosclerotic heart disease of native coronary artery without angina pectoris: Secondary | ICD-10-CM | POA: Diagnosis not present

## 2018-10-24 ENCOUNTER — Other Ambulatory Visit: Payer: Self-pay | Admitting: Thoracic Surgery (Cardiothoracic Vascular Surgery)

## 2018-10-24 DIAGNOSIS — Z992 Dependence on renal dialysis: Secondary | ICD-10-CM | POA: Diagnosis not present

## 2018-10-24 DIAGNOSIS — D509 Iron deficiency anemia, unspecified: Secondary | ICD-10-CM | POA: Diagnosis not present

## 2018-10-24 DIAGNOSIS — Z48812 Encounter for surgical aftercare following surgery on the circulatory system: Secondary | ICD-10-CM | POA: Diagnosis not present

## 2018-10-24 DIAGNOSIS — J449 Chronic obstructive pulmonary disease, unspecified: Secondary | ICD-10-CM | POA: Diagnosis not present

## 2018-10-24 DIAGNOSIS — I2510 Atherosclerotic heart disease of native coronary artery without angina pectoris: Secondary | ICD-10-CM | POA: Diagnosis not present

## 2018-10-24 DIAGNOSIS — D631 Anemia in chronic kidney disease: Secondary | ICD-10-CM | POA: Diagnosis not present

## 2018-10-24 DIAGNOSIS — I120 Hypertensive chronic kidney disease with stage 5 chronic kidney disease or end stage renal disease: Secondary | ICD-10-CM | POA: Diagnosis not present

## 2018-10-24 DIAGNOSIS — N186 End stage renal disease: Secondary | ICD-10-CM | POA: Diagnosis not present

## 2018-10-24 DIAGNOSIS — N2581 Secondary hyperparathyroidism of renal origin: Secondary | ICD-10-CM | POA: Diagnosis not present

## 2018-10-25 ENCOUNTER — Other Ambulatory Visit: Payer: Self-pay

## 2018-10-25 ENCOUNTER — Ambulatory Visit: Payer: Medicare Other | Admitting: Infectious Diseases

## 2018-10-25 ENCOUNTER — Ambulatory Visit (INDEPENDENT_AMBULATORY_CARE_PROVIDER_SITE_OTHER): Payer: Self-pay | Admitting: Thoracic Surgery (Cardiothoracic Vascular Surgery)

## 2018-10-25 ENCOUNTER — Encounter: Payer: Self-pay | Admitting: Thoracic Surgery (Cardiothoracic Vascular Surgery)

## 2018-10-25 ENCOUNTER — Encounter: Payer: Self-pay | Admitting: *Deleted

## 2018-10-25 ENCOUNTER — Ambulatory Visit
Admission: RE | Admit: 2018-10-25 | Discharge: 2018-10-25 | Disposition: A | Discharge status: Home / Self Care | Payer: Medicare Other | Source: Ambulatory Visit | Attending: Thoracic Surgery (Cardiothoracic Vascular Surgery) | Admitting: Thoracic Surgery (Cardiothoracic Vascular Surgery)

## 2018-10-25 ENCOUNTER — Other Ambulatory Visit: Payer: Self-pay | Admitting: *Deleted

## 2018-10-25 VITALS — BP 165/81 | HR 80 | Resp 16 | Ht 72.0 in | Wt 176.8 lb

## 2018-10-25 DIAGNOSIS — J90 Pleural effusion, not elsewhere classified: Secondary | ICD-10-CM

## 2018-10-25 DIAGNOSIS — D631 Anemia in chronic kidney disease: Secondary | ICD-10-CM | POA: Diagnosis not present

## 2018-10-25 DIAGNOSIS — I2510 Atherosclerotic heart disease of native coronary artery without angina pectoris: Secondary | ICD-10-CM

## 2018-10-25 DIAGNOSIS — N2581 Secondary hyperparathyroidism of renal origin: Secondary | ICD-10-CM | POA: Diagnosis not present

## 2018-10-25 DIAGNOSIS — D509 Iron deficiency anemia, unspecified: Secondary | ICD-10-CM | POA: Diagnosis not present

## 2018-10-25 DIAGNOSIS — N186 End stage renal disease: Secondary | ICD-10-CM | POA: Diagnosis not present

## 2018-10-25 DIAGNOSIS — Z953 Presence of xenogenic heart valve: Secondary | ICD-10-CM

## 2018-10-25 DIAGNOSIS — Z992 Dependence on renal dialysis: Secondary | ICD-10-CM | POA: Diagnosis not present

## 2018-10-25 NOTE — Progress Notes (Signed)
[address]       [name] [address]             [phone]    HPI: Jeffrey [name] returns for scheduled follow-up visit  Jeffrey [name] is a 63 year old gentleman with a history of end-stage renal failure on home hemodialysis, HIV, hepatitis B, aortic valve endocarditis, tubulovillous adenoma of the duodenum, hypertension, hypothyroidism, seizures, peripheral arterial disease, and thrombocytopenia.  He had staph endocarditis in November 2017.  He refused surgery initially.  I saw him back and he was agreeable to surgery but his echocardiogram showed only moderate AI at the time.  Unfortunately he developed recurrent endocarditis and severe aortic insufficiency due to a perforation of the left coronary leaflet.  I did an aortic valve replacement with a tissue valve on 07/24/2018.  His postoperative course was difficult and he had a slow recovery.  He ended up going to a skilled nursing facility.  He is back at home now.  He says he is doing pretty well.  He is not having any chest pain or shortness of breath.  He denies orthopnea.  At his last office visit he had a relatively large left pleural effusion.  He had a thoracentesis which drained a liter of amber fluid.  He had discomfort so the procedure was stopped at that point.  They only drained approximately half (or maybe even less than that) of the total amount of fluid.  He now returns for follow-up of that.  Past Medical History:  Diagnosis Date  . Anemia   . Anxiety   . Aortic valve stenosis   . Arthritis   . Asthma    per pt hx  . Dyspnea   . End stage renal disease on home HD 07/10/2011   Started HD in September 2012 at [facility] with a tunneled HD catheter, now on home HD with NxtStage. Dialyzing through AVF L lower arm with buttonhole technique as of mid 2014. His [name] does the HD treatments at home.  They are roommates for 23 years.  The [name] works 3rd shift and gets off about 8am and then puts Jeffrey [name] on HD in the  morning after getting home. Most of the time he does HD about 4 times a week, for about 4 hours per treatment. Cause of ESRD was HTN according to patient. He says he let his health go and ending up with complications, and that he didn't like seeing doctors in those days.  He says he was diagnosed with severe HTN when he lived in [place] in his 71's.   . Hepatitis B carrier ([place])   . HIV infection ([place])   . Hypertension   . Hyperthyroidism    normal now  . Pneumonia several yrs ago  . Seizures ([place]) 06/02/2011   had a mild one in Sept. 2019  . Sleep apnea    to start Cpap soon  . Thrombocytopenia ([place])     Current Outpatient Medications  Medication Sig Dispense Refill  . acetaminophen (TYLENOL) 325 MG tablet Take 2 tablets (650 mg total) by mouth every 6 (six) hours as needed for fever, headache, mild pain or moderate pain.    Marland Kitchen albuterol (PROVENTIL) (2.5 MG/3ML) 0.083% nebulizer solution Take 3 mLs (2.5 mg total) by nebulization every 6 (six) hours as needed for wheezing or shortness of breath. 360 mL 0  . Amino Acids-Protein Hydrolys (FEEDING SUPPLEMENT, PRO-STAT SUGAR FREE 64,) LIQD Take 30 mLs by mouth  2 (two) times daily. 900 mL 0  . amLODipine (NORVASC) 10 MG tablet Take 1 tablet (10 mg total) by mouth daily. 30 tablet 0  . aspirin EC 325 MG EC tablet Take 1 tablet (325 mg total) by mouth daily. 30 tablet 0  . bictegravir-emtricitabine-tenofovir AF (BIKTARVY) 50-200-25 MG TABS tablet Take 1 tablet by mouth daily. 30 tablet 0  . calcium acetate (PHOSLO) 667 MG capsule Take 2-3 capsules (1,334-2,001 mg total) by mouth See admin instructions. Take 2,001 mg three times a day with each meal and 1,334 mg with each snack 390 capsule 0  . cloNIDine (CATAPRES) 0.3 MG tablet Take 1 tablet (0.3 mg total) by mouth 3 (three) times daily. 90 tablet 0  . ferric gluconate 125 mg in sodium chloride 0.9 % 100 mL Inject 125 mg into the vein every Monday, Wednesday, and Friday with hemodialysis.    .  hydrALAZINE (APRESOLINE) 100 MG tablet Take 1 tablet (100 mg total) by mouth 3 (three) times daily. 90 tablet 0  . Ipratropium-Albuterol (COMBIVENT) 20-100 MCG/ACT AERS respimat Inhale 2 puffs into the lungs every 6 (six) hours as needed for wheezing (SOB). 4 g 0  . isosorbide mononitrate (ISMO,MONOKET) 20 MG tablet Take 1 tablet (20 mg total) by mouth 2 (two) times daily at 10 AM and 5 PM. 60 tablet 0  . Lacosamide (VIMPAT) 150 MG TABS Take 1 tablet (150 mg total) by mouth 2 (two) times daily. 60 tablet 11  . lactulose (CHRONULAC) 10 GM/15ML solution Take 30 mLs (20 g total) by mouth daily. 236 mL 0  . losartan (COZAAR) 25 MG tablet Take 25 mg by mouth daily.    . multivitamin (RENA-VIT) TABS tablet Take 1 tablet by mouth daily. 30 tablet 0  . omeprazole (PRILOSEC) 20 MG capsule Take 1 capsule (20 mg total) by mouth daily. 30 capsule 0  . OXYGEN Inhale 2 L/min into the lungs as needed (Hypoxia).    . traMADol (ULTRAM) 50 MG tablet Take 1 tablet (50 mg total) by mouth every 12 (twelve) hours as needed for moderate pain. 14 tablet 0  . traZODone (DESYREL) 50 MG tablet Take 1 tablet (50 mg total) by mouth at bedtime as needed for sleep. 30 tablet 0   No current facility-administered medications for this visit.    Facility-Administered Medications Ordered in Other Visits  Medication Dose Route Frequency Provider Last Rate Last Dose  . etomidate (AMIDATE) injection    Anesthesia Intra-op Myna Bright, CRNA   16 mg at 07/10/18 2031  . rocuronium bromide 10 mg/mL (PF) syringe   Intravenous Anesthesia Intra-op Myna Bright, CRNA   50 mg at 07/10/18 2031    Physical Exam BP (!) 165/81 (BP Location: Right Arm, Patient Position: Sitting, Cuff Size: Large)   Pulse 80   Resp 16   Ht 6' (1.829 m)   Wt 176 lb 12.8 oz (80.2 kg)   SpO2 98% Comment: RA  BMI 23.75 kg/m  63 year old man in no acute distress Chronically ill-appearing Alert and oriented x3 with no focal neurologic  deficits Lungs absent breath sounds left halfway up Cardiac regular rate and rhythm with a 2/6 systolic murmur Sternal incision well-healed  Diagnostic Tests: CHEST - 2 VIEW  COMPARISON:  10/04/2018.  FINDINGS: Prior median sternotomy and aortic valve replacement. Cardiomegaly with diffuse bilateral pulmonary infiltrates/edema. Findings most consistent CHF. Bilateral pneumonia can not be excluded. Progressive large left-sided pleural effusion. No pneumothorax.  IMPRESSION: 1. Prior median sternotomy and aortic valve  replacement. Cardiomegaly with diffuse bilateral pulmonary infiltrates/edema. Findings most consistent CHF. Bilateral pneumonia can not be excluded.  2.  Progressive large left pleural effusion.   Electronically Signed   By: Marcello Moores  Register   On: 10/25/2018 09:45 I personally reviewed the chest x-ray images and concur with the findings noted above  Impression: Jeffrey [name] is a 63 year old gentleman with a complicated medical history including end-stage renal disease on home hemodialysis, aortic valve replacement for recurrent aortic valve endocarditis, hypertension, seizures, HIV, hepatitis B, tubulovillous adenoma of the duodenum, and thrombocytopenia.  He had an aortic valve replacement in November 2019.  He probably had a small left effusion preoperatively, but on his follow-up film when he came back in December he had a large left pleural effusion.  Thoracentesis drained a liter of clear amber fluid.  They were unable to drain all of the effusion.  From the notes it says he had some discomfort and coughing so the procedure was stopped.  Unfortunately on his chest x-ray today the fluid has reaccumulated.  I discussed 3 potential options with Jeffrey [name].  The first would be repeated thoracenteses, the second would be a pleural catheter placement, and the third would be VATS for drainage of the effusion and possible decortication of the lung for  definitive management at one setting.  Of those, I favor doing a VATS.  I do not think were going to get a satisfactory result with repeated thoracenteses.  It would be optimal if we could avoid having a chronic indwelling catheter.  I described the proposed operation to Jeffrey [name].  They understand the general nature of the procedure, the need for general anesthesia, the incisions to be used, the use of a drainage tube postoperatively, the expected hospital stay, and the overall recovery.  I informed him of the indications, risk, benefits, and alternatives.  They understand the risks include, but not limited to death, MI, DVT, PE, bleeding, possible need for transfusion, infection, prolonged air leak, cardiac arrhythmias, as well as the possibility of other reversible complications.  His biggest concern is not having to go to a skilled nursing facility afterwards.  I think that is unlikely, but cannot definitively rule that out obviously.  He understands accepts the risks and wishes to proceed.  Plan: Left VATS for drainage of pleural effusion and possible decortication on 11/04/2018  [name], MD [facility] [phone]

## 2018-10-25 NOTE — H&P (View-Only) (Signed)
[address]       [place] [address]             [phone]    HPI: Jeffrey [name] returns for scheduled follow-up visit  Willson Lipa is a 63 year old gentleman with a history of end-stage renal failure on home hemodialysis, HIV, hepatitis B, aortic valve endocarditis, tubulovillous adenoma of the duodenum, hypertension, hypothyroidism, seizures, peripheral arterial disease, and thrombocytopenia.  He had staph endocarditis in November 2017.  He refused surgery initially.  I saw him back and he was agreeable to surgery but his echocardiogram showed only moderate AI at the time.  Unfortunately he developed recurrent endocarditis and severe aortic insufficiency due to a perforation of the left coronary leaflet.  I did an aortic valve replacement with a tissue valve on 07/24/2018.  His postoperative course was difficult and he had a slow recovery.  He ended up going to a skilled nursing facility.  He is back at home now.  He says he is doing pretty well.  He is not having any chest pain or shortness of breath.  He denies orthopnea.  At his last office visit he had a relatively large left pleural effusion.  He had a thoracentesis which drained a liter of amber fluid.  He had discomfort so the procedure was stopped at that point.  They only drained approximately half (or maybe even less than that) of the total amount of fluid.  He now returns for follow-up of that.  Past Medical History:  Diagnosis Date  . Anemia   . Anxiety   . Aortic valve stenosis   . Arthritis   . Asthma    per pt hx  . Dyspnea   . End stage renal disease on home HD 07/10/2011   Started HD in September 2012 at [facility] with a tunneled HD catheter, now on home HD with NxtStage. Dialyzing through AVF L lower arm with buttonhole technique as of mid 2014. His brother does the HD treatments at home.  They are roommates for 23 years.  The brother works 3rd shift and gets off about 8am and then puts Jeffrey [name] on HD in the  morning after getting home. Most of the time he does HD about 4 times a week, for about 4 hours per treatment. Cause of ESRD was HTN according to patient. He says he let his health go and ending up with complications, and that he didn't like seeing doctors in those days.  He says he was diagnosed with severe HTN when he lived in [place] in his 16's.   . Hepatitis B carrier ([place])   . HIV infection ([place])   . Hypertension   . Hyperthyroidism    normal now  . Pneumonia several yrs ago  . Seizures ([place]) 06/02/2011   had a mild one in Sept. 2019  . Sleep apnea    to start Cpap soon  . Thrombocytopenia ([place])     Current Outpatient Medications  Medication Sig Dispense Refill  . acetaminophen (TYLENOL) 325 MG tablet Take 2 tablets (650 mg total) by mouth every 6 (six) hours as needed for fever, headache, mild pain or moderate pain.    Marland Kitchen albuterol (PROVENTIL) (2.5 MG/3ML) 0.083% nebulizer solution Take 3 mLs (2.5 mg total) by nebulization every 6 (six) hours as needed for wheezing or shortness of breath. 360 mL 0  . Amino Acids-Protein Hydrolys (FEEDING SUPPLEMENT, PRO-STAT SUGAR FREE 64,) LIQD Take 30 mLs by mouth  2 (two) times daily. 900 mL 0  . amLODipine (NORVASC) 10 MG tablet Take 1 tablet (10 mg total) by mouth daily. 30 tablet 0  . aspirin EC 325 MG EC tablet Take 1 tablet (325 mg total) by mouth daily. 30 tablet 0  . bictegravir-emtricitabine-tenofovir AF (BIKTARVY) 50-200-25 MG TABS tablet Take 1 tablet by mouth daily. 30 tablet 0  . calcium acetate (PHOSLO) 667 MG capsule Take 2-3 capsules (1,334-2,001 mg total) by mouth See admin instructions. Take 2,001 mg three times a day with each meal and 1,334 mg with each snack 390 capsule 0  . cloNIDine (CATAPRES) 0.3 MG tablet Take 1 tablet (0.3 mg total) by mouth 3 (three) times daily. 90 tablet 0  . ferric gluconate 125 mg in sodium chloride 0.9 % 100 mL Inject 125 mg into the vein every Monday, Wednesday, and Friday with hemodialysis.    .  hydrALAZINE (APRESOLINE) 100 MG tablet Take 1 tablet (100 mg total) by mouth 3 (three) times daily. 90 tablet 0  . Ipratropium-Albuterol (COMBIVENT) 20-100 MCG/ACT AERS respimat Inhale 2 puffs into the lungs every 6 (six) hours as needed for wheezing (SOB). 4 g 0  . isosorbide mononitrate (ISMO,MONOKET) 20 MG tablet Take 1 tablet (20 mg total) by mouth 2 (two) times daily at 10 AM and 5 PM. 60 tablet 0  . Lacosamide (VIMPAT) 150 MG TABS Take 1 tablet (150 mg total) by mouth 2 (two) times daily. 60 tablet 11  . lactulose (CHRONULAC) 10 GM/15ML solution Take 30 mLs (20 g total) by mouth daily. 236 mL 0  . losartan (COZAAR) 25 MG tablet Take 25 mg by mouth daily.    . multivitamin (RENA-VIT) TABS tablet Take 1 tablet by mouth daily. 30 tablet 0  . omeprazole (PRILOSEC) 20 MG capsule Take 1 capsule (20 mg total) by mouth daily. 30 capsule 0  . OXYGEN Inhale 2 L/min into the lungs as needed (Hypoxia).    . traMADol (ULTRAM) 50 MG tablet Take 1 tablet (50 mg total) by mouth every 12 (twelve) hours as needed for moderate pain. 14 tablet 0  . traZODone (DESYREL) 50 MG tablet Take 1 tablet (50 mg total) by mouth at bedtime as needed for sleep. 30 tablet 0   No current facility-administered medications for this visit.    Facility-Administered Medications Ordered in Other Visits  Medication Dose Route Frequency Provider Last Rate Last Dose  . etomidate (AMIDATE) injection    Anesthesia Intra-op Myna Bright, CRNA   16 mg at 07/10/18 2031  . rocuronium bromide 10 mg/mL (PF) syringe   Intravenous Anesthesia Intra-op Myna Bright, CRNA   50 mg at 07/10/18 2031    Physical Exam BP (!) 165/81 (BP Location: Right Arm, Patient Position: Sitting, Cuff Size: Large)   Pulse 80   Resp 16   Ht 6' (1.829 m)   Wt 176 lb 12.8 oz (80.2 kg)   SpO2 98% Comment: RA  BMI 23.75 kg/m  64 year old man in no acute distress Chronically ill-appearing Alert and oriented x3 with no focal neurologic  deficits Lungs absent breath sounds left halfway up Cardiac regular rate and rhythm with a 2/6 systolic murmur Sternal incision well-healed  Diagnostic Tests: CHEST - 2 VIEW  COMPARISON:  10/04/2018.  FINDINGS: Prior median sternotomy and aortic valve replacement. Cardiomegaly with diffuse bilateral pulmonary infiltrates/edema. Findings most consistent CHF. Bilateral pneumonia can not be excluded. Progressive large left-sided pleural effusion. No pneumothorax.  IMPRESSION: 1. Prior median sternotomy and aortic valve  replacement. Cardiomegaly with diffuse bilateral pulmonary infiltrates/edema. Findings most consistent CHF. Bilateral pneumonia can not be excluded.  2.  Progressive large left pleural effusion.   Electronically Signed   By: Marcello Moores  Register   On: 10/25/2018 09:45 I personally reviewed the chest x-ray images and concur with the findings noted above  Impression: Jeffrey [name] is a 63 year old gentleman with a complicated medical history including end-stage renal disease on home hemodialysis, aortic valve replacement for recurrent aortic valve endocarditis, hypertension, seizures, HIV, hepatitis B, tubulovillous adenoma of the duodenum, and thrombocytopenia.  He had an aortic valve replacement in November 2019.  He probably had a small left effusion preoperatively, but on his follow-up film when he came back in December he had a large left pleural effusion.  Thoracentesis drained a liter of clear amber fluid.  They were unable to drain all of the effusion.  From the notes it says he had some discomfort and coughing so the procedure was stopped.  Unfortunately on his chest x-ray today the fluid has reaccumulated.  I discussed 3 potential options with Jeffrey. [place] and his brother.  The first would be repeated thoracenteses, the second would be a pleural catheter placement, and the third would be VATS for drainage of the effusion and possible decortication of the lung for  definitive management at one setting.  Of those, I favor doing a VATS.  I do not think were going to get a satisfactory result with repeated thoracenteses.  It would be optimal if we could avoid having a chronic indwelling catheter.  I described the proposed operation to Jeffrey. [place] and his brother.  They understand the general nature of the procedure, the need for general anesthesia, the incisions to be used, the use of a drainage tube postoperatively, the expected hospital stay, and the overall recovery.  I informed him of the indications, risk, benefits, and alternatives.  They understand the risks include, but not limited to death, MI, DVT, PE, bleeding, possible need for transfusion, infection, prolonged air leak, cardiac arrhythmias, as well as the possibility of other reversible complications.  His biggest concern is not having to go to a skilled nursing facility afterwards.  I think that is unlikely, but cannot definitively rule that out obviously.  He understands accepts the risks and wishes to proceed.  Plan: Left VATS for drainage of pleural effusion and possible decortication on 11/04/2018  [name], MD [facility] [phone]

## 2018-10-26 DIAGNOSIS — N186 End stage renal disease: Secondary | ICD-10-CM | POA: Diagnosis not present

## 2018-10-26 DIAGNOSIS — I120 Hypertensive chronic kidney disease with stage 5 chronic kidney disease or end stage renal disease: Secondary | ICD-10-CM | POA: Diagnosis not present

## 2018-10-26 DIAGNOSIS — Z48812 Encounter for surgical aftercare following surgery on the circulatory system: Secondary | ICD-10-CM | POA: Diagnosis not present

## 2018-10-26 DIAGNOSIS — I2510 Atherosclerotic heart disease of native coronary artery without angina pectoris: Secondary | ICD-10-CM | POA: Diagnosis not present

## 2018-10-26 DIAGNOSIS — J449 Chronic obstructive pulmonary disease, unspecified: Secondary | ICD-10-CM | POA: Diagnosis not present

## 2018-10-26 DIAGNOSIS — D631 Anemia in chronic kidney disease: Secondary | ICD-10-CM | POA: Diagnosis not present

## 2018-10-27 DIAGNOSIS — D631 Anemia in chronic kidney disease: Secondary | ICD-10-CM | POA: Diagnosis not present

## 2018-10-27 DIAGNOSIS — D509 Iron deficiency anemia, unspecified: Secondary | ICD-10-CM | POA: Diagnosis not present

## 2018-10-27 DIAGNOSIS — Z992 Dependence on renal dialysis: Secondary | ICD-10-CM | POA: Diagnosis not present

## 2018-10-27 DIAGNOSIS — N186 End stage renal disease: Secondary | ICD-10-CM | POA: Diagnosis not present

## 2018-10-27 DIAGNOSIS — N2581 Secondary hyperparathyroidism of renal origin: Secondary | ICD-10-CM | POA: Diagnosis not present

## 2018-10-31 DIAGNOSIS — N186 End stage renal disease: Secondary | ICD-10-CM | POA: Diagnosis not present

## 2018-10-31 DIAGNOSIS — Z992 Dependence on renal dialysis: Secondary | ICD-10-CM | POA: Diagnosis not present

## 2018-10-31 DIAGNOSIS — D509 Iron deficiency anemia, unspecified: Secondary | ICD-10-CM | POA: Diagnosis not present

## 2018-10-31 DIAGNOSIS — I120 Hypertensive chronic kidney disease with stage 5 chronic kidney disease or end stage renal disease: Secondary | ICD-10-CM | POA: Diagnosis not present

## 2018-10-31 DIAGNOSIS — N2581 Secondary hyperparathyroidism of renal origin: Secondary | ICD-10-CM | POA: Diagnosis not present

## 2018-10-31 DIAGNOSIS — I2510 Atherosclerotic heart disease of native coronary artery without angina pectoris: Secondary | ICD-10-CM | POA: Diagnosis not present

## 2018-10-31 DIAGNOSIS — D631 Anemia in chronic kidney disease: Secondary | ICD-10-CM | POA: Diagnosis not present

## 2018-10-31 DIAGNOSIS — Z48812 Encounter for surgical aftercare following surgery on the circulatory system: Secondary | ICD-10-CM | POA: Diagnosis not present

## 2018-10-31 DIAGNOSIS — J449 Chronic obstructive pulmonary disease, unspecified: Secondary | ICD-10-CM | POA: Diagnosis not present

## 2018-11-01 DIAGNOSIS — D631 Anemia in chronic kidney disease: Secondary | ICD-10-CM | POA: Diagnosis not present

## 2018-11-01 DIAGNOSIS — Z992 Dependence on renal dialysis: Secondary | ICD-10-CM | POA: Diagnosis not present

## 2018-11-01 DIAGNOSIS — D509 Iron deficiency anemia, unspecified: Secondary | ICD-10-CM | POA: Diagnosis not present

## 2018-11-01 DIAGNOSIS — N186 End stage renal disease: Secondary | ICD-10-CM | POA: Diagnosis not present

## 2018-11-01 DIAGNOSIS — N2581 Secondary hyperparathyroidism of renal origin: Secondary | ICD-10-CM | POA: Diagnosis not present

## 2018-11-01 NOTE — Pre-Procedure Instructions (Addendum)
NAHEIM BURGEN  11/01/2018      [facility] [place], [address] [place] [address] Phone: [phone] Fax: [phone]    Your procedure is scheduled on November 04, 2018.  Report to [facility] Admitting at 530 AM.  Call this number if you have problems the morning of surgery:  [phone]   Remember:  Do not eat or drink after midnight.    Take these medicines the morning of surgery with A SIP OF WATER  acetaminophen (TYLENOL) albuterol (PROVENTIL) (2.5 MG/3ML) 0.083% nebulizer solution-if needed amLODipine (NORVASC)  bictegravir-emtricitabine-tenofovir AF (BIKTARVY) cloNIDine (CATAPRES) Ipratropium-Albuterol (COMBIVENT) 20-100 MCG/ACT -if needed Lacosamide (VIMPAT)  isosorbide mononitrate (ISMO,MONOKET)  traMADol (ULTRAM)-if needed omeprazole (PRILOSEC)  Follow your surgeon's instructions on when to hold/resume aspirin.  If no instructions were given call the office to determine how they would like to you take aspirin  Beginning now, STOP taking any Aleve, Naproxen, Ibuprofen, Motrin, Advil, Goody's, BC's, all herbal medications, fish oil, and all vitamins    Do not wear jewelry  Do not wear lotions, powders, or colognes, or deodorant.             Men may shave face and neck.  Do not bring valuables to the hospital.  [facility] is not responsible for any belongings or valuables.  Contacts, dentures or bridgework may not be worn into surgery.  Leave your suitcase in the car.  After surgery it may be brought to your room.  For patients admitted to the hospital, discharge time will be determined by your treatment team.  Patients discharged the day of surgery will not be allowed to drive home.    [place]- Preparing For Surgery  Before surgery, you can play an important role. Because skin is not sterile, your skin needs to be as free of germs as  possible. You can reduce the number of germs on your skin by washing with CHG (chlorahexidine gluconate) Soap before surgery.  CHG is an antiseptic cleaner which kills germs and bonds with the skin to continue killing germs even after washing.    Oral Hygiene is also important to reduce your risk of infection.  Remember - BRUSH YOUR TEETH THE MORNING OF SURGERY WITH YOUR REGULAR TOOTHPASTE  Please do not use if you have an allergy to CHG or antibacterial soaps. If your skin becomes reddened/irritated stop using the CHG.  Do not shave (including legs and underarms) for at least 48 hours prior to first CHG shower. It is OK to shave your face.  Please follow these instructions carefully.   1. Shower the NIGHT BEFORE SURGERY and the MORNING OF SURGERY with CHG.   2. If you chose to wash your hair, wash your hair first as usual with your normal shampoo.  3. After you shampoo, rinse your hair and body thoroughly to remove the shampoo.  4. Use CHG as you would any other liquid soap. You can apply CHG directly to the skin and wash gently with a scrungie or a clean washcloth.   5. Apply the CHG Soap to your body ONLY FROM THE NECK DOWN.  Do not use on open wounds or open sores. Avoid contact with your eyes, ears, mouth and genitals (private parts). Wash Face and genitals (private parts)  with your normal soap.  6. Wash thoroughly, paying special attention to the area where your surgery  will be performed.  7. Thoroughly rinse your body with warm water from the neck down.  8. DO NOT shower/wash with your normal soap after using and rinsing off the CHG Soap.  9. Pat yourself dry with a CLEAN TOWEL.  10. Wear CLEAN PAJAMAS to bed the night before surgery, wear comfortable clothes the morning of surgery  11. Place CLEAN SHEETS on your bed the night of your first shower and DO NOT SLEEP WITH PETS.  Day of Surgery:  Do not apply any deodorants/lotions.  Please wear clean clothes to the  hospital/surgery center.   Remember to brush your teeth WITH YOUR REGULAR TOOTHPASTE.   Please read over the following fact sheets that you were given. Pain Booklet, Coughing and Deep Breathing, MRSA Information and Surgical Site Infection Prevention

## 2018-11-02 ENCOUNTER — Other Ambulatory Visit: Payer: Self-pay

## 2018-11-02 ENCOUNTER — Encounter (HOSPITAL_COMMUNITY)
Admission: RE | Admit: 2018-11-02 | Discharge: 2018-11-02 | Disposition: A | Discharge status: Home / Self Care | Payer: Medicare Other | Source: Ambulatory Visit | Attending: Thoracic Surgery (Cardiothoracic Vascular Surgery) | Admitting: Thoracic Surgery (Cardiothoracic Vascular Surgery)

## 2018-11-02 ENCOUNTER — Ambulatory Visit (HOSPITAL_COMMUNITY)
Admission: RE | Admit: 2018-11-02 | Discharge: 2018-11-02 | Disposition: A | Discharge status: Home / Self Care | Payer: Medicare Other | Source: Ambulatory Visit | Attending: Thoracic Surgery (Cardiothoracic Vascular Surgery) | Admitting: Thoracic Surgery (Cardiothoracic Vascular Surgery)

## 2018-11-02 ENCOUNTER — Encounter (HOSPITAL_COMMUNITY): Payer: Self-pay

## 2018-11-02 DIAGNOSIS — J90 Pleural effusion, not elsewhere classified: Secondary | ICD-10-CM | POA: Diagnosis not present

## 2018-11-02 DIAGNOSIS — I459 Conduction disorder, unspecified: Secondary | ICD-10-CM | POA: Diagnosis not present

## 2018-11-02 DIAGNOSIS — Z992 Dependence on renal dialysis: Secondary | ICD-10-CM | POA: Diagnosis not present

## 2018-11-02 DIAGNOSIS — B181 Chronic viral hepatitis B without delta-agent: Secondary | ICD-10-CM | POA: Diagnosis not present

## 2018-11-02 DIAGNOSIS — Z953 Presence of xenogenic heart valve: Secondary | ICD-10-CM | POA: Diagnosis not present

## 2018-11-02 DIAGNOSIS — N186 End stage renal disease: Secondary | ICD-10-CM | POA: Diagnosis not present

## 2018-11-02 DIAGNOSIS — B20 Human immunodeficiency virus [HIV] disease: Secondary | ICD-10-CM | POA: Diagnosis not present

## 2018-11-02 DIAGNOSIS — I440 Atrioventricular block, first degree: Secondary | ICD-10-CM | POA: Diagnosis not present

## 2018-11-02 DIAGNOSIS — D631 Anemia in chronic kidney disease: Secondary | ICD-10-CM | POA: Diagnosis not present

## 2018-11-02 DIAGNOSIS — I120 Hypertensive chronic kidney disease with stage 5 chronic kidney disease or end stage renal disease: Secondary | ICD-10-CM | POA: Diagnosis not present

## 2018-11-02 DIAGNOSIS — E039 Hypothyroidism, unspecified: Secondary | ICD-10-CM | POA: Diagnosis not present

## 2018-11-02 DIAGNOSIS — J984 Other disorders of lung: Secondary | ICD-10-CM | POA: Diagnosis not present

## 2018-11-02 DIAGNOSIS — J9811 Atelectasis: Secondary | ICD-10-CM | POA: Diagnosis not present

## 2018-11-02 LAB — CBC
HCT: 31.9 % — ABNORMAL LOW (ref 39.0–52.0)
Hemoglobin: 10.2 g/dL — ABNORMAL LOW (ref 13.0–17.0)
MCH: 30.7 pg (ref 26.0–34.0)
MCHC: 32 g/dL (ref 30.0–36.0)
MCV: 96.1 fL (ref 80.0–100.0)
Platelets: 203 10*3/uL (ref 150–400)
RBC: 3.32 MIL/uL — ABNORMAL LOW (ref 4.22–5.81)
RDW: 16.3 % — AB (ref 11.5–15.5)
WBC: 6.9 10*3/uL (ref 4.0–10.5)
nRBC: 0 % (ref 0.0–0.2)

## 2018-11-02 LAB — BLOOD GAS, ARTERIAL
Acid-Base Excess: 2.1 mmol/L — ABNORMAL HIGH (ref 0.0–2.0)
Bicarbonate: 25.9 mmol/L (ref 20.0–28.0)
Drawn by: 470691
FIO2: 21
O2 Saturation: 96.4 %
Patient temperature: 98.6
pCO2 arterial: 38.8 mmHg (ref 32.0–48.0)
pH, Arterial: 7.44 (ref 7.350–7.450)
pO2, Arterial: 80.7 mmHg — ABNORMAL LOW (ref 83.0–108.0)

## 2018-11-02 LAB — COMPREHENSIVE METABOLIC PANEL
ALT: 15 U/L (ref 0–44)
AST: 50 U/L — ABNORMAL HIGH (ref 15–41)
Albumin: 3.5 g/dL (ref 3.5–5.0)
Alkaline Phosphatase: 63 U/L (ref 38–126)
Anion gap: 17 — ABNORMAL HIGH (ref 5–15)
BUN: 38 mg/dL — ABNORMAL HIGH (ref 8–23)
CO2: 22 mmol/L (ref 22–32)
Calcium: 9 mg/dL (ref 8.9–10.3)
Chloride: 97 mmol/L — ABNORMAL LOW (ref 98–111)
Creatinine, Ser: 7.46 mg/dL — ABNORMAL HIGH (ref 0.61–1.24)
GFR calc Af Amer: 8 mL/min — ABNORMAL LOW (ref >60)
GFR calc non Af Amer: 7 mL/min — ABNORMAL LOW (ref >60)
Glucose, Bld: 113 mg/dL — ABNORMAL HIGH (ref 70–99)
Potassium: 3.7 mmol/L (ref 3.5–5.1)
Sodium: 136 mmol/L (ref 135–145)
Total Bilirubin: 0.7 mg/dL (ref 0.3–1.2)
Total Protein: 7.2 g/dL (ref 6.5–8.1)

## 2018-11-02 LAB — TYPE AND SCREEN
ABO/RH(D): O NEG
Antibody Screen: NEGATIVE

## 2018-11-02 LAB — PROTIME-INR
INR: 1.07
Prothrombin Time: 13.8 seconds (ref 11.4–15.2)

## 2018-11-02 LAB — SURGICAL PCR SCREEN
MRSA, PCR: NEGATIVE
Staphylococcus aureus: POSITIVE — AB

## 2018-11-02 LAB — APTT: APTT: 49 s — AB (ref 24–36)

## 2018-11-02 NOTE — Progress Notes (Addendum)
Anesthesia Chart Review:  Case:  [id number] Date/Time:  11/04/18 0715   Procedures:      VIDEO ASSISTED THORACOSCOPY (Left Chest)     DRAINAGE OF PLEURAL EFFUSION (Left )     possible DECORTICATION (Left )   Anesthesia type:  General   Pre-op diagnosis:  LEFT PLEURAL EFFUSION   Location:  [facility] OR ROOM 10 / [place] [facility]   Surgeon:  [name], MD      DISCUSSION: Patient is a 63 year old male scheduled for the above procedure. He is s/p AVR 07/28/18 and noted to have moderate-large left pleural effusion at 09/20/18 follow-up visit, treated with prednisone taper and partial drainage by US guided left thoracentesis on 10/04/18 (procedure stopped due to pain). Recurrent large left pleural effusion at 10/25/18 follow-up, so Dr. Roxan Hockey recommended the above procedure.  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 07/04/18), severe AI (s/p AVR 23 mm Inspiris Resilia AV 07/28/18), HIV, hepatitis B carrier, MSSA 07/2016 (TEE: severe AI, no definite vegetation; he initially declined AVR 02/2017, but s/p AVR 07/28/18), HTN, anemia, thrombocytopenia, dyspnea, hyperparathyroidism (hyperthyroidism is listed, but with normal TSH 2012, 09/2017, and 07/10/18), OSA (CPAP ordered, but does not have yet), asthma, seizures (first 05/2011 in the setting of acute renal failure; recurrent seizures June/July 2019 and started on Vimpat, increased after 07/10/18 seizure), possible CVA (07/12/18 MRI during admission for uremic encephalopathy with seizure showed small acute anterior left fontal lobe and small subacute anterior right corona radiata infarcts and chronic small lacunar infarct in the left thalamus; EEG suggested encephalopathy, 07/14/18 MRA head was negative; Neurology felt finding of small CVA to not be too clinically significant and more of an incidental finding of unclear etiology--"could be due to small vessel disease vs. Cardioembolic source given  severe AVR, LAE, non-compaction and hx of endocarditis").  He does not report any progressive SOB. He will do home dialysis on 11/03/18. Last seizure 06/2018. HGB 10.2, but up from 7.2 on 08/20/18. PTT 49, so will plan to repeat on arrival, but historically his PTT has been elevated . He will also need an ISTAT4 given ESRD/hemodialysis patient.    VS: BP (!) 179/76   Pulse 88   Temp 36.9 C   Resp 18   Ht 6' (1.829 m)   Wt 79.2 kg   SpO2 97%   BMI 23.68 kg/m    PROVIDERS: Velna Ochs, MD is PCP ([place]) Bobby Rumpf, MD is ID Glenetta Hew, MD is cardiologist Pearson Grippe, MD is nephrologist [facility]) Marcial Pacas, MD is neurologist. Last visit 10/06/18 with six month follow-up recommended.   LABS: Preoperative labs noted. He will need repeat labs (ISTAT4 if available, for ESRD) and repeat PTT on the day of surgery. T&S done.  (all labs ordered are listed, but only abnormal results are displayed)  Labs Reviewed  APTT - Abnormal; Notable for the following components:      Result Value   aPTT 49 (*)    All other components within normal limits  BLOOD GAS, ARTERIAL - Abnormal; Notable for the following components:   pO2, Arterial 80.7 (*)    Acid-Base Excess 2.1 (*)    All other components within normal limits  CBC - Abnormal; Notable for the following components:   RBC 3.32 (*)    Hemoglobin 10.2 (*)    HCT 31.9 (*)    RDW 16.3 (*)    All other  components within normal limits  COMPREHENSIVE METABOLIC PANEL - Abnormal; Notable for the following components:   Chloride 97 (*)    Glucose, Bld 113 (*)    BUN 38 (*)    Creatinine, Ser 7.46 (*)    AST 50 (*)    GFR calc non Af Amer 7 (*)    GFR calc Af Amer 8 (*)    Anion gap 17 (*)    All other components within normal limits  SURGICAL PCR SCREEN  PROTIME-INR  TYPE AND SCREEN     OTHER: PFTs 07/26/18: FVC 1.47 (34%), FEV1 0.95 (29%), DLCO unc 10.02 (28%), DLCO cor 12.67  (36%).  Overnight EEG 07/11/18: Clinical interpretation: This 12 hours of intensive EEG monitoring with simultaneous video monitoring document did not record any clinical subclinical seizures. Background activities were abnormal due to reactive background activity slowing suggestive of encephalopathy of nonspecific etiologies. Clinical correlation is advised.   IMAGES: CXR 11/02/18: IMPRESSION: Moderate to large left pleural effusion slightly less than on the prior chest film. Persistent overlying atelectasis. Streaky right basilar atelectasis.   EKG:  - EKG 11/02/18: Sinus rhythm with 1st degree A-V block Left ventricular hypertrophy with QRS widening ST & T wave abnormality, consider lateral ischemia Prolonged QT (QT 422 ms, QTc 468 ms) Confirmed by Thompson Grayer ([id number]) on 11/02/2018 10:34:47 PM  - EKG 08/11/18: Sinus rhythm with 1st degree A-V block Non-specific intra-ventricular conduction delay Abnormal QRS-T angle, consider primary T wave abnormality Confirmed by Kathlyn Sacramento [phone]) on 08/11/2018 11:02:41 PM   CV: Intra-operative TEE (AVR) 117/19: PRE-BYPASS: LV: Normal cavity size. Concentric hypertrophy of severe severity. LV systolic function is normal with an EF of 60-65%. Grade II (moderate, pseudorelaxation) left ventricular diastolic dysfunction. There are no obvious wall motion abnormalities. No thrombus present. No mass present. SEPTUM: Normal atrial and ventricular septum with no septal defect and normal septal motion. LA: Left atrium normal in structure and function. AV: The valve is trileaflet. Normal leaflet separation. No stenosis. Severe regurgitation. AORTA: No aneurysm present. No graft present. No significant coarctation present. No aortic dissection present MV: Normal valve structure. Normal transmitral gradient. No stenosis. Normal leaflet mobility. Trace regurgitation. RV: Normal cavity size, wall thickness and ejection fraction. RA: Normal  right atrial size. TV:  Normal tricuspid valve structure. No stenosis. Trace regurgitation. PV: Normal pulmonic valve structure. No stenosis. No regurgitation. PERICARDIUM: Normal pericardium with no pericardial effusion. PA: Normal pulmonary artery with no dilation. POST-BYPASS: AORTA: Aorta unchanged from pre-bypass LV: LV unchanged from pre-bypass RV: RV unchanged from pre-bypass AV: AV bioprosthetic valve was placed. Valve is well seated. Leaflet is thin and freely mobile.  MV: MV unchanged from pre-bypass TR: TV unchanged from pre-bypass Extra Cardiac Findings: Large lft pleural effusion present which was drained during procedure. No ascites present. No liver mass present.    Cardiac cath (PRE-AVR) 07/25/18:  Hemodynamic findings consistent with mild pulmonary hypertension.  LV end diastolic pressure is moderately elevated.  Normal RHC pressures with High CO/CI  Dist LM lesion is 40% stenosed.  Prox LAD lesion is 40% stenosed with 30% stenosed side branch in Ost 2nd Diag. SUMMARY  Moderate single-vessel disease with extensive calcification.  Severe systemic hypertension -unresponsive to medications  Relatively normal right heart cath pressures.  High output by both Thermodilution and Fick.  Carotid U/S 07/14/18: Summary: - Right Carotid: Velocities in the right ICA are consistent with a 1-39% stenosis. - Left Carotid: Velocities in the left ICA are consistent with a 1-39% stenosis.  Vertebrals: Bilateral vertebral arteries demonstrate antegrade flow.   Past Medical History:  Diagnosis Date  . Anemia   . Anxiety   . Aortic valve stenosis   . Arthritis   . Asthma    per pt hx  . Dyspnea   . End stage renal disease on home HD 07/10/2011   Started HD in September 2012 at [facility] with a tunneled HD catheter, now on home HD with NxtStage. Dialyzing through AVF L lower arm with buttonhole technique as of mid 2014. His brother does the HD treatments at home.  They are  roommates for 23 years.  The brother works 3rd shift and gets off about 8am and then puts Mr Richeson on HD in the morning after getting home. Most of the time he does HD about 4 times a week, for about 4 hours per treatment. Cause of ESRD was HTN according to patient. He says he let his health go and ending up with complications, and that he didn't like seeing doctors in those days.  He says he was diagnosed with severe HTN when he lived in [place] in his 16's.   . Hepatitis B carrier ([place])   . HIV infection ([place])   . Hypertension   . Hyperthyroidism    normal now  . Pneumonia several yrs ago  . Seizures ([place]) 06/02/2011   had a mild one in Sept. 2019  . Sleep apnea    to start Cpap soon  . Thrombocytopenia ([place])     Past Surgical History:  Procedure Laterality Date  . AORTIC VALVE REPLACEMENT N/A 07/28/2018   Procedure: AORTIC VALVE REPLACEMENT (AVR) using Inspiris Resilia  size 23 Aortic Valve.;  Surgeon: [name], MD;  Location: [place];  Service: Open Heart Surgery;  Laterality: N/A;  . AV FISTULA PLACEMENT  06/02/11   Left radiocephalic AVF  . BIOPSY  02/17/2018   Procedure: BIOPSY;  Surgeon: Milus Banister, MD;  Location: [facility];  Service: Endoscopy;;  . COLONOSCOPY    . COLONOSCOPY WITH PROPOFOL N/A 01/27/2018   Procedure: COLONOSCOPY WITH PROPOFOL;  Surgeon: [name], MD;  Location: [facility] END[facility]COPY;  Service: Gastroenterology;  Laterality: N/A;  . CORONARY ANGIOPLASTY    . ERCP  03/21/2018   AT [place]  . ESOPHAGOGASTRODUODENOSCOPY N/A 02/17/2018   Procedure: ESOPHAGOGASTRODUODENOSCOPY (EGD);  Surgeon: Milus Banister, MD;  Location: [facility];  Service: Endoscopy;  Laterality: N/A;  . ESOPHAGOGASTRODUODENOSCOPY (EGD) WITH PROPOFOL N/A 01/27/2018   Procedure: ESOPHAGOGASTRODUODENOSCOPY (EGD) WITH PROPOFOL;  Surgeon: [name], MD;  Location: [facility] ENDOSCOPY;  Service: Gastroenterology;  Laterality: N/A;  . ESOPHAGOGASTRODUODENOSCOPY (EGD) WITH PROPOFOL  N/A 04/03/2018   Procedure: ESOPHAGOGASTRODUODENOSCOPY (EGD) WITH PROPOFOL;  Surgeon: Clarene Essex, MD;  Location: [place];  Service: Endoscopy;  Laterality: N/A;  . EUS N/A 02/17/2018   Procedure: UPPER ENDOSCOPIC ULTRASOUND (EUS) RADIAL;  Surgeon: Milus Banister, MD;  [facility];  Service: Endoscopy;  Laterality: N/A;  . fistulaogram     x 2 last 2 years  . IR DIALY SHUNT INTRO NEEDLE/INTRACATH INITIAL W/IMG LEFT Left 04/20/2018  . IR FLUORO GUIDE CV LINE RIGHT  09/16/2017  . IR THORACENTESIS ASP PLEURAL SPACE W/IMG GUIDE  10/04/2018  . IR US GUIDE VASC ACCESS RIGHT  09/16/2017  . IRRIGATION AND DEBRIDEMENT KNEE Left 09/15/2017   Procedure: IRRIGATION AND DEBRIDEMENT KNEE; arthroscopic clean out;  Surgeon: [name], MD;  Location: [facility];  Service: Orthopedics;  Laterality: Left;  . OTHER SURGICAL HISTORY  removal temporary HD catheter   . REVISON OF ARTERIOVENOUS FISTULA Left 10/10/2015   Procedure: REVISON OF LEFT RADIOCEPHALIC ARTERIOVENOUS FISTULA;  Surgeon: Angelia Mould, MD;  Location: [place];  Service: Vascular;  Laterality: Left;  . REVISON OF ARTERIOVENOUS FISTULA Left 02/07/2016   Procedure: REPAIR OF PSEUDO-ANEUREYSM OF LEFT ARM  ARTERIOVENOUS FISTULA;  Surgeon: Angelia Mould, MD;  Location: [place];  Service: Vascular;  Laterality: Left;  . REVISON OF ARTERIOVENOUS FISTULA Left 4/54/0981   Procedure: PLICATION OF LEFT ARM RADIOCEPHALIC ARTERIOVENOUS FISTULA PSEUDOANEURYSM;  Surgeon: Angelia Mould, MD;  Location: [place];  Service: Vascular;  Laterality: Left;  . REVISON OF ARTERIOVENOUS FISTULA Left 07/04/2018   Procedure: REVISION OF ARTERIOVENOUS RADIOCEPHALIC FISTULA;  Surgeon: Angelia Mould, MD;  Location: [place];  Service: Vascular;  Laterality: Left;  . RIGHT/LEFT HEART CATH AND CORONARY ANGIOGRAPHY N/A 02/17/2017   Procedure: Right/Left Heart Cath and Coronary Angiography;  Surgeon: Sherren Mocha, MD;  Location: [place]  CV LAB;  Service: Cardiovascular;  Laterality: N/A;  . RIGHT/LEFT HEART CATH AND CORONARY ANGIOGRAPHY N/A 07/25/2018   Procedure: RIGHT/LEFT HEART CATH AND CORONARY ANGIOGRAPHY;  Surgeon: Leonie Man, MD;  Location: [place] CV LAB;  Service: Cardiovascular;  Laterality: N/A;  . TEE WITHOUT CARDIOVERSION N/A 09/11/2016   Procedure: TRANSESOPHAGEAL ECHOCARDIOGRAM (TEE);  Surgeon: Dorothy Spark, MD;  Location: [place];  Service: Cardiovascular;  Laterality: N/A;  . TEE WITHOUT CARDIOVERSION N/A 05/05/2018   Procedure: TRANSESOPHAGEAL ECHOCARDIOGRAM (TEE);  Surgeon: Adrian Prows, MD;  Location: [place];  Service: Cardiovascular;  Laterality: N/A;  Prefer after 3:30 PM  . TEE WITHOUT CARDIOVERSION N/A 07/28/2018   Procedure: TRANSESOPHAGEAL ECHOCARDIOGRAM (TEE);  Surgeon: [name], MD;  Location: [place];  Service: Open Heart Surgery;  Laterality: N/A;    MEDICATIONS: . acetaminophen (TYLENOL) 325 MG tablet  . acetaminophen (TYLENOL) 500 MG tablet  . albuterol (PROVENTIL) (2.5 MG/3ML) 0.083% nebulizer solution  . Amino Acids-Protein Hydrolys (FEEDING SUPPLEMENT, PRO-STAT SUGAR FREE 64,) LIQD  . amLODipine (NORVASC) 10 MG tablet  . aspirin EC 325 MG EC tablet  . aspirin EC 81 MG tablet  . bictegravir-emtricitabine-tenofovir AF (BIKTARVY) 50-200-25 MG TABS tablet  . calcium acetate (PHOSLO) 667 MG capsule  . cloNIDine (CATAPRES - DOSED IN MG/24 HR) 0.3 mg/24hr patch  . cloNIDine (CATAPRES) 0.3 MG tablet  . ferric gluconate 125 mg in sodium chloride 0.9 % 100 mL  . hydrALAZINE (APRESOLINE) 100 MG tablet  . Ipratropium-Albuterol (COMBIVENT) 20-100 MCG/ACT AERS respimat  . isosorbide mononitrate (ISMO,MONOKET) 20 MG tablet  . Lacosamide (VIMPAT) 150 MG TABS  . lactulose (CHRONULAC) 10 GM/15ML solution  . losartan (COZAAR) 25 MG tablet  . multivitamin (RENA-VIT) TABS tablet  . omeprazole (PRILOSEC) 20 MG capsule  . OXYGEN  . traMADol (ULTRAM) 50 MG tablet  .  traZODone (DESYREL) 50 MG tablet   No current facility-administered medications for this encounter.    Marland Kitchen etomidate (AMIDATE) injection  . rocuronium bromide 10 mg/mL (PF) syringe  He is on ASA 81 mg, no longer on 325 mg.   Myra Gianotti, PA-C Surgical Short Stay/Anesthesiology [facility] Phone [phone] [facility] Phone [phone] 11/02/2018 5:10 PM

## 2018-11-02 NOTE — Progress Notes (Signed)
PCP -  Velna Ochs @ [place] Cardiologist - Dr. Burt Knack Neurologist: DR. Billey Chang in [place] Neurologist: Dr. Krista Blue  Chest x-ray - today EKG - today Stress Test -  ECHO - 07/2018 Cardiac Cath - 07/2018  Sleep Study - 05/2018 CPAP - doesn't use  Fasting Blood Sugar - na Checks Blood Sugar _____ times a day  Home dialysis 4x a week  Blood Thinner Instructions: Aspirin Instructions: continue ,do not take the day of surgery per Ryan @ [facility]  Anesthesia review: cardiac Hx.  Patient denies shortness of breath, fever, cough and chest pain at PAT appointment   Patient verbalized understanding of instructions that were given to them at the PAT appointment. Patient was also instructed that they will need to review over the PAT instructions again at home before surgery.

## 2018-11-03 DIAGNOSIS — I120 Hypertensive chronic kidney disease with stage 5 chronic kidney disease or end stage renal disease: Secondary | ICD-10-CM | POA: Diagnosis not present

## 2018-11-03 DIAGNOSIS — J449 Chronic obstructive pulmonary disease, unspecified: Secondary | ICD-10-CM | POA: Diagnosis not present

## 2018-11-03 DIAGNOSIS — N2581 Secondary hyperparathyroidism of renal origin: Secondary | ICD-10-CM | POA: Diagnosis not present

## 2018-11-03 DIAGNOSIS — Z992 Dependence on renal dialysis: Secondary | ICD-10-CM | POA: Diagnosis not present

## 2018-11-03 DIAGNOSIS — N186 End stage renal disease: Secondary | ICD-10-CM | POA: Diagnosis not present

## 2018-11-03 DIAGNOSIS — I2510 Atherosclerotic heart disease of native coronary artery without angina pectoris: Secondary | ICD-10-CM | POA: Diagnosis not present

## 2018-11-03 DIAGNOSIS — Z48812 Encounter for surgical aftercare following surgery on the circulatory system: Secondary | ICD-10-CM | POA: Diagnosis not present

## 2018-11-03 DIAGNOSIS — D509 Iron deficiency anemia, unspecified: Secondary | ICD-10-CM | POA: Diagnosis not present

## 2018-11-03 DIAGNOSIS — D631 Anemia in chronic kidney disease: Secondary | ICD-10-CM | POA: Diagnosis not present

## 2018-11-03 NOTE — Anesthesia Preprocedure Evaluation (Addendum)
Anesthesia Evaluation  Patient identified by MRN, date of birth, ID band Patient awake    Reviewed: Allergy & Precautions, NPO status , Patient's Chart, lab work & pertinent test results  Airway Mallampati: III  TM Distance: >3 FB Neck ROM: Full    Dental  (+) Edentulous Upper, Edentulous Lower   Pulmonary asthma , sleep apnea , COPD,  COPD inhaler, former smoker,    Pulmonary exam normal breath sounds clear to auscultation       Cardiovascular hypertension, Pt. on medications + CAD and + Peripheral Vascular Disease  Normal cardiovascular exam Rhythm:Regular Rate:Normal  ECG: rate 74. Sinus rhythm with 1st degree A-V block Left ventricular hypertrophy with QRS widening  ECHO:  Aortic valve: The valve is trileaflet. No stenosis. Severe regurgitation. Mitral valve: Trace regurgitation. Right ventricle: Normal cavity size, wall thickness and ejection fraction. Tricuspid valve: Trace regurgitation. Pericardium: There is a left pleural effusion. Left pleural effusion present. Large left pleural effusion noted and drained during procedure. No ascites present. No liver mass present.   Glenetta Hew, MD is cardiologist  S/p AVR   Neuro/Psych Seizures -, Well Controlled,  Anxiety CVA, No Residual Symptoms    GI/Hepatic negative GI ROS, (+) Hepatitis -, B  Endo/Other  negative endocrine ROS  Renal/GU ESRF and DialysisRenal disease     Musculoskeletal negative musculoskeletal ROS (+)   Abdominal   Peds  Hematology  (+) anemia , HIV,   Anesthesia Other Findings LEFT PLEURAL EFFUSION  Reproductive/Obstetrics                           Anesthesia Physical Anesthesia Plan  ASA: IV  Anesthesia Plan: General   Post-op Pain Management:    Induction: Intravenous  PONV Risk Score and Plan: 2 and Ondansetron, Dexamethasone and Treatment may vary due to age or medical condition  Airway Management  Planned: Double Lumen EBT  Additional Equipment: Arterial line  Intra-op Plan:   Post-operative Plan: Possible Post-op intubation/ventilation and Extubation in OR  Informed Consent: I have reviewed the patients History and Physical, chart, labs and discussed the procedure including the risks, benefits and alternatives for the proposed anesthesia with the patient or authorized representative who has indicated his/her understanding and acceptance.     Dental advisory given  Plan Discussed with: CRNA  Anesthesia Plan Comments: (Reviewed PAT note written 11/02/2018 by Myra Gianotti, PA-C. )      Anesthesia Quick Evaluation

## 2018-11-04 ENCOUNTER — Inpatient Hospital Stay (HOSPITAL_COMMUNITY)
Admission: RE | Admit: 2018-11-04 | Discharge: 2018-11-11 | DRG: 163 | Disposition: A | Discharge status: Home / Self Care | Payer: Medicare Other | Attending: Thoracic Surgery (Cardiothoracic Vascular Surgery) | Admitting: Thoracic Surgery (Cardiothoracic Vascular Surgery)

## 2018-11-04 ENCOUNTER — Encounter (HOSPITAL_COMMUNITY): Payer: Self-pay

## 2018-11-04 ENCOUNTER — Encounter (HOSPITAL_COMMUNITY)
Admission: RE | Disposition: A | Discharge status: Home / Self Care | Payer: Self-pay | Source: Home / Self Care | Attending: Thoracic Surgery (Cardiothoracic Vascular Surgery)

## 2018-11-04 ENCOUNTER — Ambulatory Visit: Payer: Medicare Other | Admitting: Infectious Diseases

## 2018-11-04 ENCOUNTER — Inpatient Hospital Stay (HOSPITAL_COMMUNITY): Payer: Medicare Other

## 2018-11-04 ENCOUNTER — Inpatient Hospital Stay (HOSPITAL_COMMUNITY): Payer: Medicare Other | Admitting: Certified Registered Nurse Anesthetist

## 2018-11-04 ENCOUNTER — Other Ambulatory Visit: Payer: Self-pay

## 2018-11-04 ENCOUNTER — Inpatient Hospital Stay (HOSPITAL_COMMUNITY): Payer: Medicare Other | Admitting: Vascular Surgery

## 2018-11-04 DIAGNOSIS — I120 Hypertensive chronic kidney disease with stage 5 chronic kidney disease or end stage renal disease: Secondary | ICD-10-CM | POA: Diagnosis present

## 2018-11-04 DIAGNOSIS — Z79899 Other long term (current) drug therapy: Secondary | ICD-10-CM

## 2018-11-04 DIAGNOSIS — Z953 Presence of xenogenic heart valve: Secondary | ICD-10-CM | POA: Diagnosis not present

## 2018-11-04 DIAGNOSIS — Z4682 Encounter for fitting and adjustment of non-vascular catheter: Secondary | ICD-10-CM

## 2018-11-04 DIAGNOSIS — J90 Pleural effusion, not elsewhere classified: Secondary | ICD-10-CM | POA: Diagnosis present

## 2018-11-04 DIAGNOSIS — B181 Chronic viral hepatitis B without delta-agent: Secondary | ICD-10-CM | POA: Diagnosis present

## 2018-11-04 DIAGNOSIS — Z992 Dependence on renal dialysis: Secondary | ICD-10-CM

## 2018-11-04 DIAGNOSIS — Z880 Allergy status to penicillin: Secondary | ICD-10-CM | POA: Diagnosis not present

## 2018-11-04 DIAGNOSIS — Z7982 Long term (current) use of aspirin: Secondary | ICD-10-CM | POA: Diagnosis not present

## 2018-11-04 DIAGNOSIS — B20 Human immunodeficiency virus [HIV] disease: Secondary | ICD-10-CM | POA: Diagnosis present

## 2018-11-04 DIAGNOSIS — Z48813 Encounter for surgical aftercare following surgery on the respiratory system: Secondary | ICD-10-CM | POA: Diagnosis not present

## 2018-11-04 DIAGNOSIS — Z885 Allergy status to narcotic agent status: Secondary | ICD-10-CM

## 2018-11-04 DIAGNOSIS — G40909 Epilepsy, unspecified, not intractable, without status epilepticus: Secondary | ICD-10-CM | POA: Diagnosis present

## 2018-11-04 DIAGNOSIS — Z8673 Personal history of transient ischemic attack (TIA), and cerebral infarction without residual deficits: Secondary | ICD-10-CM

## 2018-11-04 DIAGNOSIS — I10 Essential (primary) hypertension: Secondary | ICD-10-CM | POA: Diagnosis not present

## 2018-11-04 DIAGNOSIS — G4730 Sleep apnea, unspecified: Secondary | ICD-10-CM | POA: Diagnosis present

## 2018-11-04 DIAGNOSIS — E039 Hypothyroidism, unspecified: Secondary | ICD-10-CM | POA: Diagnosis present

## 2018-11-04 DIAGNOSIS — Z9861 Coronary angioplasty status: Secondary | ICD-10-CM | POA: Diagnosis not present

## 2018-11-04 DIAGNOSIS — J984 Other disorders of lung: Secondary | ICD-10-CM | POA: Diagnosis present

## 2018-11-04 DIAGNOSIS — I440 Atrioventricular block, first degree: Secondary | ICD-10-CM | POA: Diagnosis present

## 2018-11-04 DIAGNOSIS — J45909 Unspecified asthma, uncomplicated: Secondary | ICD-10-CM | POA: Diagnosis present

## 2018-11-04 DIAGNOSIS — I459 Conduction disorder, unspecified: Secondary | ICD-10-CM | POA: Diagnosis present

## 2018-11-04 DIAGNOSIS — D631 Anemia in chronic kidney disease: Secondary | ICD-10-CM | POA: Diagnosis present

## 2018-11-04 DIAGNOSIS — I351 Nonrheumatic aortic (valve) insufficiency: Secondary | ICD-10-CM | POA: Diagnosis present

## 2018-11-04 DIAGNOSIS — I2510 Atherosclerotic heart disease of native coronary artery without angina pectoris: Secondary | ICD-10-CM | POA: Diagnosis present

## 2018-11-04 DIAGNOSIS — Z888 Allergy status to other drugs, medicaments and biological substances status: Secondary | ICD-10-CM

## 2018-11-04 DIAGNOSIS — L299 Pruritus, unspecified: Secondary | ICD-10-CM | POA: Diagnosis not present

## 2018-11-04 DIAGNOSIS — R0602 Shortness of breath: Secondary | ICD-10-CM | POA: Diagnosis not present

## 2018-11-04 DIAGNOSIS — R918 Other nonspecific abnormal finding of lung field: Secondary | ICD-10-CM | POA: Diagnosis not present

## 2018-11-04 DIAGNOSIS — Z09 Encounter for follow-up examination after completed treatment for conditions other than malignant neoplasm: Secondary | ICD-10-CM

## 2018-11-04 DIAGNOSIS — J9811 Atelectasis: Secondary | ICD-10-CM | POA: Diagnosis not present

## 2018-11-04 DIAGNOSIS — Z87891 Personal history of nicotine dependence: Secondary | ICD-10-CM

## 2018-11-04 DIAGNOSIS — N186 End stage renal disease: Secondary | ICD-10-CM | POA: Diagnosis present

## 2018-11-04 DIAGNOSIS — Z9889 Other specified postprocedural states: Secondary | ICD-10-CM

## 2018-11-04 DIAGNOSIS — I739 Peripheral vascular disease, unspecified: Secondary | ICD-10-CM | POA: Diagnosis present

## 2018-11-04 DIAGNOSIS — Z8249 Family history of ischemic heart disease and other diseases of the circulatory system: Secondary | ICD-10-CM

## 2018-11-04 DIAGNOSIS — Z833 Family history of diabetes mellitus: Secondary | ICD-10-CM

## 2018-11-04 HISTORY — PX: PLEURAL EFFUSION DRAINAGE: SHX5099

## 2018-11-04 HISTORY — PX: VIDEO ASSISTED THORACOSCOPY: SHX5073

## 2018-11-04 HISTORY — PX: CHEST TUBE INSERTION: SHX231

## 2018-11-04 HISTORY — PX: DECORTICATION: SHX5101

## 2018-11-04 HISTORY — PX: VIDEO ASSISTED THORACOSCOPY (VATS)/THOROCOTOMY: SHX6173

## 2018-11-04 LAB — GRAM STAIN

## 2018-11-04 LAB — CBC
HEMATOCRIT: 31.7 % — AB (ref 39.0–52.0)
Hemoglobin: 10 g/dL — ABNORMAL LOW (ref 13.0–17.0)
MCH: 30.5 pg (ref 26.0–34.0)
MCHC: 31.5 g/dL (ref 30.0–36.0)
MCV: 96.6 fL (ref 80.0–100.0)
PLATELETS: 183 10*3/uL (ref 150–400)
RBC: 3.28 MIL/uL — ABNORMAL LOW (ref 4.22–5.81)
RDW: 16.7 % — ABNORMAL HIGH (ref 11.5–15.5)
WBC: 12 10*3/uL — ABNORMAL HIGH (ref 4.0–10.5)
nRBC: 0 % (ref 0.0–0.2)

## 2018-11-04 LAB — POCT I-STAT 4, (NA,K, GLUC, HGB,HCT)
Glucose, Bld: 110 mg/dL — ABNORMAL HIGH (ref 70–99)
HCT: 31 % — ABNORMAL LOW (ref 39.0–52.0)
Hemoglobin: 10.5 g/dL — ABNORMAL LOW (ref 13.0–17.0)
POTASSIUM: 3.1 mmol/L — AB (ref 3.5–5.1)
Sodium: 136 mmol/L (ref 135–145)

## 2018-11-04 LAB — APTT: aPTT: 35 seconds (ref 24–36)

## 2018-11-04 SURGERY — VIDEO ASSISTED THORACOSCOPY
Anesthesia: General | Site: Chest | Laterality: Left

## 2018-11-04 MED ORDER — SODIUM CHLORIDE 0.9 % IV SOLN
125.0000 mg | INTRAVENOUS | Status: DC
  Administered 2018-11-07 – 2018-11-11 (×3): 125 mg via INTRAVENOUS
  Filled 2018-11-04 (×5): qty 10

## 2018-11-04 MED ORDER — BISACODYL 5 MG PO TBEC
10.0000 mg | DELAYED_RELEASE_TABLET | Freq: Every day | ORAL | Status: DC
  Administered 2018-11-04 – 2018-11-10 (×6): 10 mg via ORAL
  Filled 2018-11-04 (×7): qty 2

## 2018-11-04 MED ORDER — METOCLOPRAMIDE HCL 5 MG/ML IJ SOLN
10.0000 mg | Freq: Four times a day (QID) | INTRAMUSCULAR | Status: AC
  Administered 2018-11-04 – 2018-11-05 (×3): 10 mg via INTRAVENOUS
  Filled 2018-11-04 (×3): qty 2

## 2018-11-04 MED ORDER — EPHEDRINE SULFATE-NACL 50-0.9 MG/10ML-% IV SOSY
PREFILLED_SYRINGE | INTRAVENOUS | Status: DC | PRN
  Administered 2018-11-04 (×3): 10 mg via INTRAVENOUS
  Administered 2018-11-04: 5 mg via INTRAVENOUS

## 2018-11-04 MED ORDER — ACETAMINOPHEN 500 MG PO TABS
1000.0000 mg | ORAL_TABLET | Freq: Once | ORAL | Status: AC
  Administered 2018-11-04: 1000 mg via ORAL
  Filled 2018-11-04: qty 2

## 2018-11-04 MED ORDER — NALOXONE HCL 0.4 MG/ML IJ SOLN: 0.4000 mg | INTRAMUSCULAR | Status: DC | PRN

## 2018-11-04 MED ORDER — PRO-STAT SUGAR FREE PO LIQD
30.0000 mL | Freq: Every day | ORAL | Status: DC
  Administered 2018-11-05 – 2018-11-11 (×7): 30 mL via ORAL
  Filled 2018-11-04 (×7): qty 30

## 2018-11-04 MED ORDER — FENTANYL CITRATE (PF) 250 MCG/5ML IJ SOLN
INTRAMUSCULAR | Status: AC
  Filled 2018-11-04: qty 10

## 2018-11-04 MED ORDER — TRAZODONE HCL 50 MG PO TABS
50.0000 mg | ORAL_TABLET | Freq: Every evening | ORAL | Status: DC | PRN
  Administered 2018-11-04 – 2018-11-10 (×4): 50 mg via ORAL
  Filled 2018-11-04 (×4): qty 1

## 2018-11-04 MED ORDER — OXYCODONE HCL 5 MG PO TABS
5.0000 mg | ORAL_TABLET | ORAL | Status: DC | PRN
  Administered 2018-11-05 – 2018-11-07 (×6): 5 mg via ORAL
  Administered 2018-11-08 (×2): 10 mg via ORAL
  Administered 2018-11-09 (×2): 5 mg via ORAL
  Filled 2018-11-04: qty 2
  Filled 2018-11-04 (×3): qty 1
  Filled 2018-11-04: qty 2
  Filled 2018-11-04 (×3): qty 1
  Filled 2018-11-04 (×2): qty 2

## 2018-11-04 MED ORDER — ONDANSETRON HCL 4 MG/2ML IJ SOLN: 4.0000 mg | Freq: Four times a day (QID) | INTRAMUSCULAR | Status: DC | PRN

## 2018-11-04 MED ORDER — ONDANSETRON HCL 4 MG/2ML IJ SOLN
INTRAMUSCULAR | Status: DC | PRN
  Administered 2018-11-04: 4 mg via INTRAVENOUS

## 2018-11-04 MED ORDER — VANCOMYCIN HCL IN DEXTROSE 1-5 GM/200ML-% IV SOLN
1000.0000 mg | Freq: Two times a day (BID) | INTRAVENOUS | Status: AC
  Administered 2018-11-04: 1000 mg via INTRAVENOUS
  Filled 2018-11-04: qty 200

## 2018-11-04 MED ORDER — DEXAMETHASONE SODIUM PHOSPHATE 10 MG/ML IJ SOLN
INTRAMUSCULAR | Status: DC | PRN
  Administered 2018-11-04: 10 mg via INTRAVENOUS

## 2018-11-04 MED ORDER — PANTOPRAZOLE SODIUM 40 MG PO TBEC
40.0000 mg | DELAYED_RELEASE_TABLET | Freq: Every day | ORAL | Status: DC
  Administered 2018-11-05 – 2018-11-11 (×7): 40 mg via ORAL
  Filled 2018-11-04 (×7): qty 1

## 2018-11-04 MED ORDER — SODIUM CHLORIDE 0.9% FLUSH: 9.0000 mL | INTRAVENOUS | Status: DC | PRN

## 2018-11-04 MED ORDER — BUPIVACAINE HCL (PF) 0.5 % IJ SOLN
INTRAMUSCULAR | Status: AC
  Filled 2018-11-04: qty 30

## 2018-11-04 MED ORDER — DEXAMETHASONE SODIUM PHOSPHATE 10 MG/ML IJ SOLN
INTRAMUSCULAR | Status: AC
  Filled 2018-11-04: qty 1

## 2018-11-04 MED ORDER — DIPHENHYDRAMINE HCL 50 MG/ML IJ SOLN
12.5000 mg | Freq: Four times a day (QID) | INTRAMUSCULAR | Status: DC | PRN
  Administered 2018-11-06 (×2): 12.5 mg via INTRAVENOUS
  Filled 2018-11-04 (×3): qty 1

## 2018-11-04 MED ORDER — ONDANSETRON HCL 4 MG/2ML IJ SOLN
INTRAMUSCULAR | Status: AC
  Filled 2018-11-04: qty 2

## 2018-11-04 MED ORDER — AMLODIPINE BESYLATE 10 MG PO TABS
10.0000 mg | ORAL_TABLET | Freq: Every day | ORAL | Status: DC
  Administered 2018-11-05 – 2018-11-11 (×7): 10 mg via ORAL
  Filled 2018-11-04 (×7): qty 1

## 2018-11-04 MED ORDER — LACOSAMIDE 50 MG PO TABS
150.0000 mg | ORAL_TABLET | Freq: Two times a day (BID) | ORAL | Status: DC
  Administered 2018-11-04 – 2018-11-11 (×14): 150 mg via ORAL
  Filled 2018-11-04 (×14): qty 3

## 2018-11-04 MED ORDER — LOSARTAN POTASSIUM 25 MG PO TABS
25.0000 mg | ORAL_TABLET | Freq: Every day | ORAL | Status: DC
  Administered 2018-11-05 – 2018-11-11 (×7): 25 mg via ORAL
  Filled 2018-11-04 (×7): qty 1

## 2018-11-04 MED ORDER — BICTEGRAVIR-EMTRICITAB-TENOFOV 50-200-25 MG PO TABS
1.0000 | ORAL_TABLET | Freq: Every day | ORAL | Status: DC
  Administered 2018-11-05 – 2018-11-11 (×7): 1 via ORAL
  Filled 2018-11-04 (×7): qty 1

## 2018-11-04 MED ORDER — BUPIVACAINE LIPOSOME 1.3 % IJ SUSP
20.0000 mL | Freq: Once | INTRAMUSCULAR | Status: AC
  Administered 2018-11-04: 20 mL
  Filled 2018-11-04: qty 20

## 2018-11-04 MED ORDER — CISATRACURIUM BESYLATE (PF) 10 MG/5ML IV SOLN
INTRAVENOUS | Status: DC | PRN
  Administered 2018-11-04: 4 mg via INTRAVENOUS
  Administered 2018-11-04: 10 mg via INTRAVENOUS

## 2018-11-04 MED ORDER — RENA-VITE PO TABS
1.0000 | ORAL_TABLET | Freq: Every day | ORAL | Status: DC
  Administered 2018-11-05: 1 via ORAL
  Filled 2018-11-04: qty 1

## 2018-11-04 MED ORDER — GLYCOPYRROLATE 0.2 MG/ML IJ SOLN
INTRAMUSCULAR | Status: DC | PRN
  Administered 2018-11-04 (×2): 0.1 mg via INTRAVENOUS
  Administered 2018-11-04: 0.4 mg via INTRAVENOUS

## 2018-11-04 MED ORDER — CLONIDINE HCL 0.3 MG/24HR TD PTWK: 0.3000 mg | MEDICATED_PATCH | TRANSDERMAL | Status: DC

## 2018-11-04 MED ORDER — NEOSTIGMINE METHYLSULFATE 10 MG/10ML IV SOLN
INTRAVENOUS | Status: DC | PRN
  Administered 2018-11-04: 3 mg via INTRAVENOUS

## 2018-11-04 MED ORDER — LIDOCAINE 2% (20 MG/ML) 5 ML SYRINGE
INTRAMUSCULAR | Status: AC
  Filled 2018-11-04: qty 5

## 2018-11-04 MED ORDER — TRAMADOL HCL 50 MG PO TABS
50.0000 mg | ORAL_TABLET | Freq: Four times a day (QID) | ORAL | Status: DC | PRN
  Administered 2018-11-05: 100 mg via ORAL
  Administered 2018-11-06 – 2018-11-07 (×2): 50 mg via ORAL
  Administered 2018-11-09 (×2): 100 mg via ORAL
  Administered 2018-11-09: 50 mg via ORAL
  Administered 2018-11-10 – 2018-11-11 (×3): 100 mg via ORAL
  Filled 2018-11-04 (×7): qty 2
  Filled 2018-11-04 (×3): qty 1

## 2018-11-04 MED ORDER — PROPOFOL 10 MG/ML IV BOLUS
INTRAVENOUS | Status: AC
  Filled 2018-11-04: qty 40

## 2018-11-04 MED ORDER — ISOSORBIDE MONONITRATE 20 MG PO TABS
20.0000 mg | ORAL_TABLET | Freq: Two times a day (BID) | ORAL | Status: DC
  Administered 2018-11-05 – 2018-11-11 (×13): 20 mg via ORAL
  Filled 2018-11-04 (×14): qty 1

## 2018-11-04 MED ORDER — SENNOSIDES-DOCUSATE SODIUM 8.6-50 MG PO TABS
1.0000 | ORAL_TABLET | Freq: Every day | ORAL | Status: DC
  Filled 2018-11-04 (×4): qty 1

## 2018-11-04 MED ORDER — IPRATROPIUM-ALBUTEROL 20-100 MCG/ACT IN AERS: 2.0000 | INHALATION_SPRAY | Freq: Four times a day (QID) | RESPIRATORY_TRACT | Status: DC | PRN

## 2018-11-04 MED ORDER — HYDRALAZINE HCL 50 MG PO TABS
100.0000 mg | ORAL_TABLET | Freq: Three times a day (TID) | ORAL | Status: DC
  Administered 2018-11-04 – 2018-11-11 (×18): 100 mg via ORAL
  Filled 2018-11-04 (×19): qty 2

## 2018-11-04 MED ORDER — ACETAMINOPHEN 160 MG/5ML PO SOLN: 1000.0000 mg | Freq: Four times a day (QID) | ORAL | Status: DC

## 2018-11-04 MED ORDER — FENTANYL CITRATE (PF) 100 MCG/2ML IJ SOLN: 25.0000 ug | INTRAMUSCULAR | Status: DC | PRN

## 2018-11-04 MED ORDER — EPHEDRINE 5 MG/ML INJ
INTRAVENOUS | Status: AC
  Filled 2018-11-04: qty 10

## 2018-11-04 MED ORDER — CLONIDINE HCL 0.2 MG PO TABS: 0.3000 mg | ORAL_TABLET | Freq: Three times a day (TID) | ORAL | Status: DC

## 2018-11-04 MED ORDER — ENOXAPARIN SODIUM 40 MG/0.4ML ~~LOC~~ SOLN
40.0000 mg | SUBCUTANEOUS | Status: DC
  Administered 2018-11-05 – 2018-11-06 (×2): 40 mg via SUBCUTANEOUS
  Filled 2018-11-04 (×2): qty 0.4

## 2018-11-04 MED ORDER — ACETAMINOPHEN 500 MG PO TABS
1000.0000 mg | ORAL_TABLET | Freq: Four times a day (QID) | ORAL | Status: DC
  Administered 2018-11-04 – 2018-11-06 (×5): 1000 mg via ORAL
  Filled 2018-11-04 (×5): qty 2

## 2018-11-04 MED ORDER — LEVALBUTEROL HCL 0.63 MG/3ML IN NEBU
0.6300 mg | INHALATION_SOLUTION | Freq: Four times a day (QID) | RESPIRATORY_TRACT | Status: DC
  Administered 2018-11-04 (×2): 0.63 mg via RESPIRATORY_TRACT
  Filled 2018-11-04 (×2): qty 3

## 2018-11-04 MED ORDER — SODIUM CHLORIDE (PF) 0.9 % IJ SOLN
INTRAMUSCULAR | Status: DC | PRN
  Administered 2018-11-04: 50 mL via INTRAVENOUS

## 2018-11-04 MED ORDER — LIDOCAINE 2% (20 MG/ML) 5 ML SYRINGE
INTRAMUSCULAR | Status: DC | PRN
  Administered 2018-11-04: 60 mg via INTRAVENOUS

## 2018-11-04 MED ORDER — ALBUTEROL SULFATE (2.5 MG/3ML) 0.083% IN NEBU
2.5000 mg | INHALATION_SOLUTION | Freq: Four times a day (QID) | RESPIRATORY_TRACT | Status: DC | PRN
  Administered 2018-11-09: 2.5 mg via RESPIRATORY_TRACT
  Filled 2018-11-04: qty 3

## 2018-11-04 MED ORDER — LIDOCAINE HCL 0.5 % IJ SOLN
INTRAMUSCULAR | Status: DC | PRN
  Administered 2018-11-04: 30 mL

## 2018-11-04 MED ORDER — DIPHENHYDRAMINE HCL 12.5 MG/5ML PO ELIX
12.5000 mg | ORAL_SOLUTION | Freq: Four times a day (QID) | ORAL | Status: DC | PRN
  Filled 2018-11-04: qty 5

## 2018-11-04 MED ORDER — CALCIUM ACETATE (PHOS BINDER) 667 MG PO CAPS: 1334.0000 mg | ORAL_CAPSULE | ORAL | Status: DC

## 2018-11-04 MED ORDER — VANCOMYCIN HCL IN DEXTROSE 1-5 GM/200ML-% IV SOLN
1000.0000 mg | INTRAVENOUS | Status: AC
  Administered 2018-11-04: 1000 mg via INTRAVENOUS
  Filled 2018-11-04: qty 200

## 2018-11-04 MED ORDER — FENTANYL CITRATE (PF) 250 MCG/5ML IJ SOLN
INTRAMUSCULAR | Status: DC | PRN
  Administered 2018-11-04 (×6): 50 ug via INTRAVENOUS

## 2018-11-04 MED ORDER — MIDAZOLAM HCL 2 MG/2ML IJ SOLN
INTRAMUSCULAR | Status: AC
  Filled 2018-11-04: qty 2

## 2018-11-04 MED ORDER — SODIUM CHLORIDE 0.9 % IV SOLN
INTRAVENOUS | Status: DC
  Administered 2018-11-04 (×2): via INTRAVENOUS

## 2018-11-04 MED ORDER — POTASSIUM CHLORIDE 10 MEQ/50ML IV SOLN: 10.0000 meq | Freq: Every day | INTRAVENOUS | Status: DC | PRN

## 2018-11-04 MED ORDER — LABETALOL HCL 5 MG/ML IV SOLN
INTRAVENOUS | Status: DC | PRN
  Administered 2018-11-04 (×2): 5 mg via INTRAVENOUS

## 2018-11-04 MED ORDER — MORPHINE SULFATE 2 MG/ML IV SOLN
INTRAVENOUS | Status: DC
  Administered 2018-11-04 (×2): 2 mg via INTRAVENOUS
  Administered 2018-11-04: 4 mg via INTRAVENOUS
  Administered 2018-11-04: 2 mg via INTRAVENOUS
  Administered 2018-11-04: 1 mg via INTRAVENOUS
  Administered 2018-11-05: 0 mg via INTRAVENOUS
  Administered 2018-11-05: 6 mg via INTRAVENOUS
  Administered 2018-11-05: 0 mg via INTRAVENOUS
  Administered 2018-11-05 – 2018-11-06 (×2): 3 mg via INTRAVENOUS
  Administered 2018-11-06 (×2): 1 mg via INTRAVENOUS
  Administered 2018-11-06 (×2): 0 mg via INTRAVENOUS
  Filled 2018-11-04: qty 30

## 2018-11-04 MED ORDER — GLYCOPYRROLATE PF 0.2 MG/ML IJ SOSY
PREFILLED_SYRINGE | INTRAMUSCULAR | Status: AC
  Filled 2018-11-04: qty 3

## 2018-11-04 MED ORDER — PROPOFOL 10 MG/ML IV BOLUS
INTRAVENOUS | Status: DC | PRN
  Administered 2018-11-04: 50 mg via INTRAVENOUS
  Administered 2018-11-04: 100 mg via INTRAVENOUS
  Administered 2018-11-04: 50 mg via INTRAVENOUS

## 2018-11-04 MED ORDER — ASPIRIN EC 81 MG PO TBEC
81.0000 mg | DELAYED_RELEASE_TABLET | Freq: Every day | ORAL | Status: DC
  Administered 2018-11-05 – 2018-11-11 (×7): 81 mg via ORAL
  Filled 2018-11-04 (×7): qty 1

## 2018-11-04 MED ORDER — 0.9 % SODIUM CHLORIDE (POUR BTL) OPTIME
TOPICAL | Status: DC | PRN
  Administered 2018-11-04: 3000 mL

## 2018-11-04 MED ORDER — MIDAZOLAM HCL 2 MG/2ML IJ SOLN
INTRAMUSCULAR | Status: DC | PRN
  Administered 2018-11-04: 2 mg via INTRAVENOUS

## 2018-11-04 MED ORDER — SODIUM CHLORIDE 0.9 % IV SOLN
INTRAVENOUS | Status: DC | PRN
  Administered 2018-11-04: 50 ug/min via INTRAVENOUS

## 2018-11-04 MED ORDER — NEOSTIGMINE METHYLSULFATE 3 MG/3ML IV SOSY
PREFILLED_SYRINGE | INTRAVENOUS | Status: AC
  Filled 2018-11-04: qty 3

## 2018-11-04 MED ORDER — ONDANSETRON HCL 4 MG/2ML IJ SOLN: 4.0000 mg | Freq: Once | INTRAMUSCULAR | Status: DC | PRN

## 2018-11-04 SURGICAL SUPPLY — 82 items
APPLICATOR COTTON TIP 6 STRL (MISCELLANEOUS) IMPLANT
APPLICATOR COTTON TIP 6IN STRL (MISCELLANEOUS)
APPLIER CLIP ROT 10 11.4 M/L (STAPLE)
CANISTER SUCT 3000ML PPV (MISCELLANEOUS) ×4 IMPLANT
CATH THORACIC 28FR RT ANG (CATHETERS) IMPLANT
CATH THORACIC 36FR (CATHETERS) IMPLANT
CATH THORACIC 36FR RT ANG (CATHETERS) IMPLANT
CLEANER TIP ELECTROSURG 2X2 (MISCELLANEOUS) ×2 IMPLANT
CLIP APPLIE ROT 10 11.4 M/L (STAPLE) IMPLANT
CLIP VESOCCLUDE MED 6/CT (CLIP) IMPLANT
CONN ST 1/4X3/8  BEN (MISCELLANEOUS) ×2
CONN ST 1/4X3/8 BEN (MISCELLANEOUS) ×2 IMPLANT
CONN Y 3/8X3/8X3/8  BEN (MISCELLANEOUS) ×1
CONN Y 3/8X3/8X3/8 BEN (MISCELLANEOUS) ×1 IMPLANT
CONT SPEC 4OZ CLIKSEAL STRL BL (MISCELLANEOUS) ×8 IMPLANT
COVER SURGICAL LIGHT HANDLE (MISCELLANEOUS) ×2 IMPLANT
COVER WAND RF STERILE (DRAPES) ×2 IMPLANT
DECANTER SPIKE VIAL GLASS SM (MISCELLANEOUS) ×2 IMPLANT
DERMABOND ADHESIVE PROPEN (GAUZE/BANDAGES/DRESSINGS) ×1
DERMABOND ADVANCED (GAUZE/BANDAGES/DRESSINGS)
DERMABOND ADVANCED .7 DNX12 (GAUZE/BANDAGES/DRESSINGS) IMPLANT
DERMABOND ADVANCED .7 DNX6 (GAUZE/BANDAGES/DRESSINGS) ×1 IMPLANT
DRAIN CHANNEL 28F RND 3/8 FF (WOUND CARE) ×4 IMPLANT
DRAIN CHANNEL 32F RND 10.7 FF (WOUND CARE) IMPLANT
DRAPE CV SPLIT W-CLR ANES SCRN (DRAPES) ×2 IMPLANT
DRAPE INCISE IOBAN 66X45 STRL (DRAPES) ×2 IMPLANT
DRAPE LAPAROSCOPIC ABDOMINAL (DRAPES) IMPLANT
DRAPE ORTHO SPLIT 77X108 STRL (DRAPES) ×1
DRAPE SLUSH/WARMER DISC (DRAPES) ×2 IMPLANT
DRAPE SURG ORHT 6 SPLT 77X108 (DRAPES) ×1 IMPLANT
ELECT REM PT RETURN 9FT ADLT (ELECTROSURGICAL) ×2
ELECTRODE REM PT RTRN 9FT ADLT (ELECTROSURGICAL) ×1 IMPLANT
GAUZE SPONGE 4X4 12PLY STRL (GAUZE/BANDAGES/DRESSINGS) ×2 IMPLANT
GAUZE SPONGE 4X4 12PLY STRL LF (GAUZE/BANDAGES/DRESSINGS) ×2 IMPLANT
GLOVE BIOGEL PI IND STRL 7.5 (GLOVE) ×1 IMPLANT
GLOVE BIOGEL PI INDICATOR 7.5 (GLOVE) ×1
GLOVE SURG SIGNA 7.5 PF LTX (GLOVE) ×4 IMPLANT
GOWN STRL REUS W/ TWL LRG LVL3 (GOWN DISPOSABLE) ×2 IMPLANT
GOWN STRL REUS W/ TWL XL LVL3 (GOWN DISPOSABLE) ×1 IMPLANT
GOWN STRL REUS W/TWL LRG LVL3 (GOWN DISPOSABLE) ×2
GOWN STRL REUS W/TWL XL LVL3 (GOWN DISPOSABLE) ×1
HEMOSTAT SURGICEL 2X14 (HEMOSTASIS) IMPLANT
IV CATH 22GX1 FEP (IV SOLUTION) IMPLANT
KIT BASIN OR (CUSTOM PROCEDURE TRAY) ×2 IMPLANT
KIT SUCTION CATH 14FR (SUCTIONS) ×2 IMPLANT
KIT TURNOVER KIT B (KITS) ×2 IMPLANT
NS IRRIG 1000ML POUR BTL (IV SOLUTION) ×4 IMPLANT
PACK CHEST (CUSTOM PROCEDURE TRAY) ×2 IMPLANT
PAD ARMBOARD 7.5X6 YLW CONV (MISCELLANEOUS) ×4 IMPLANT
POUCH ENDO CATCH II 15MM (MISCELLANEOUS) IMPLANT
POUCH SPECIMEN RETRIEVAL 10MM (ENDOMECHANICALS) IMPLANT
SEALANT PROGEL (MISCELLANEOUS) IMPLANT
SEALANT SURG COSEAL 4ML (VASCULAR PRODUCTS) IMPLANT
SEALANT SURG COSEAL 8ML (VASCULAR PRODUCTS) IMPLANT
SOLUTION ANTI FOG 6CC (MISCELLANEOUS) ×2 IMPLANT
SPECIMEN JAR MEDIUM (MISCELLANEOUS) ×6 IMPLANT
SPONGE INTESTINAL PEANUT (DISPOSABLE) ×8 IMPLANT
SPONGE TONSIL TAPE 1 RFD (DISPOSABLE) ×2 IMPLANT
SUT PROLENE 4 0 RB 1 (SUTURE)
SUT PROLENE 4-0 RB1 .5 CRCL 36 (SUTURE) IMPLANT
SUT SILK  1 MH (SUTURE) ×3
SUT SILK 1 MH (SUTURE) ×3 IMPLANT
SUT SILK 2 0SH CR/8 30 (SUTURE) IMPLANT
SUT SILK 3 0SH CR/8 30 (SUTURE) IMPLANT
SUT VIC AB 1 CTX 36 (SUTURE) ×1
SUT VIC AB 1 CTX36XBRD ANBCTR (SUTURE) ×1 IMPLANT
SUT VIC AB 2-0 CTX 36 (SUTURE) ×2 IMPLANT
SUT VIC AB 2-0 UR6 27 (SUTURE) IMPLANT
SUT VIC AB 3-0 MH 27 (SUTURE) IMPLANT
SUT VIC AB 3-0 X1 27 (SUTURE) ×4 IMPLANT
SUT VICRYL 2 TP 1 (SUTURE) IMPLANT
SYR 20CC LL (SYRINGE) IMPLANT
SYSTEM SAHARA CHEST DRAIN ATS (WOUND CARE) ×2 IMPLANT
TAPE CLOTH 4X10 WHT NS (GAUZE/BANDAGES/DRESSINGS) ×2 IMPLANT
TAPE CLOTH SURG 6X10 WHT LF (GAUZE/BANDAGES/DRESSINGS) ×2 IMPLANT
TIP APPLICATOR SPRAY EXTEND 16 (VASCULAR PRODUCTS) IMPLANT
TOWEL GREEN STERILE (TOWEL DISPOSABLE) ×2 IMPLANT
TOWEL GREEN STERILE FF (TOWEL DISPOSABLE) ×2 IMPLANT
TRAY FOLEY MTR SLVR 16FR STAT (SET/KITS/TRAYS/PACK) ×2 IMPLANT
TROCAR XCEL BLADELESS 5X75MML (TROCAR) ×2 IMPLANT
TROCAR XCEL NON-BLD 5MMX100MML (ENDOMECHANICALS) IMPLANT
WATER STERILE IRR 1000ML POUR (IV SOLUTION) ×4 IMPLANT

## 2018-11-04 NOTE — Anesthesia Postprocedure Evaluation (Signed)
Anesthesia Post Note  Patient: Jeffrey [name]  Procedure(s) Performed: VIDEO ASSISTED THORACOSCOPY (Left Chest) DRAINAGE OF PLEURAL EFFUSION (Left Chest) Left Lung DECORTICATION (Left Chest) Chest Tube Insertion (Left Chest)     Patient location during evaluation: PACU Anesthesia Type: General Level of consciousness: awake and alert Pain management: pain level controlled Vital Signs Assessment: post-procedure vital signs reviewed and stable Respiratory status: spontaneous breathing, nonlabored ventilation, respiratory function stable and patient connected to nasal cannula oxygen Cardiovascular status: blood pressure returned to baseline and stable Postop Assessment: no apparent nausea or vomiting Anesthetic complications: no    Last Vitals:  Vitals:   11/04/18 1315 11/04/18 1505  BP: (!) 144/85   Pulse: 69   Resp: 17   Temp:    SpO2: 98% 97%    Last Pain:  Vitals:   11/04/18 1315  TempSrc:   PainSc: 7                  Ryan P Ellender

## 2018-11-04 NOTE — Anesthesia Procedure Notes (Signed)
Arterial Line Insertion Start/End2/14/2020 7:00 AM Performed by: Renato Shin, CRNA, CRNA  Patient location: Pre-op. Right, radial was placed Catheter size: 20 G Hand hygiene performed , maximum sterile barriers used  and Seldinger technique used Allen's test indicative of satisfactory collateral circulation Attempts: 2 Procedure performed without using ultrasound guided technique. Following insertion, dressing applied and Biopatch. Post procedure assessment: normal  Patient tolerated the procedure well with no immediate complications.

## 2018-11-04 NOTE — Transfer of Care (Signed)
Immediate Anesthesia Transfer of Care Note  Patient: Jeffrey [name]  Procedure(s) Performed: VIDEO ASSISTED THORACOSCOPY (Left Chest) DRAINAGE OF PLEURAL EFFUSION (Left Chest) Left Lung DECORTICATION (Left Chest) Chest Tube Insertion (Left Chest)  Patient Location: PACU  Anesthesia Type:General  Level of Consciousness: drowsy  Airway & Oxygen Therapy: Patient Spontanous Breathing and Patient connected to face mask oxygen  Post-op Assessment: Report given to RN and Post -op Vital signs reviewed and stable  Post vital signs: Reviewed and stable  Last Vitals:  Vitals Value Taken Time  BP 141/87 11/04/2018 10:41 AM  Temp    Pulse 67 11/04/2018 10:42 AM  Resp 18 11/04/2018 10:42 AM  SpO2 100 % 11/04/2018 10:42 AM  Vitals shown include unvalidated device data.  Last Pain:  Vitals:   11/04/18 0604  TempSrc:   PainSc: 0-No pain         Complications: No apparent anesthesia complications

## 2018-11-04 NOTE — Brief Op Note (Signed)
11/04/2018  10:08 AM  PATIENT:  Jeffrey [name]  63 y.o. male  PRE-OPERATIVE DIAGNOSIS:  LEFT PLEURAL EFFUSION  POST-OPERATIVE DIAGNOSIS:  LEFT PLEURAL EFFUSION  PROCEDURE:  Procedure(s):  VIDEO ASSISTED THORACOSCOPY (Left) DRAINAGE OF PLEURAL EFFUSION  -1750 ml of pleural fluid DECORTICATION OF PARIETAL AND VISCERAL PLEURA INTERCOSTAL NERVE BLOCK WITH EXPAREL  SURGEON:  Surgeon(s) and Role:    * [name], MD - Primary  PHYSICIAN ASSISTANT: Ellwood Handler PA-C  ANESTHESIA:   general  EBL: Minimal  BLOOD ADMINISTERED:none  DRAINS: 28 Blake Drain x 2   LOCAL MEDICATIONS USED:  MARCAINE    and BUPIVICAINE   SPECIMEN:  Source of Specimen:  Pleural Fluid, Pleural, Visceral Peel  DISPOSITION OF SPECIMEN:  Pathology, Cytology, Microbiology  COUNTS:  YES  TOURNIQUET:  * No tourniquets in log *  DICTATION: .Dragon Dictation  PLAN OF CARE: Admit to inpatient   PATIENT DISPOSITION:  PACU - hemodynamically stable.   Delay start of Pharmacological VTE agent (>24hrs) due to surgical blood loss or risk of bleeding: no

## 2018-11-04 NOTE — Op Note (Signed)
NAME: Jeffrey [name], Jeffrey [name] MEDICAL RECORD ZW:[id number] ACCOUNT [id number] DATE OF BIRTH:02-15-1956 FACILITY: [facility] LOCATION: [facility] PHYSICIAN:Julianah Marciel Chaya Jan, MD  OPERATIVE REPORT  DATE OF PROCEDURE:  11/04/2018  PREOPERATIVE DIAGNOSIS:  Recurrent left pleural effusion.  POSTOPERATIVE DIAGNOSIS:  Recurrent left pleural effusion.  PROCEDURE:   Left video-assisted thoracoscopy, Drainage of pleural effusion, Decortication of fibrothorax and Intercostal nerve blocks.  SURGEON:  Modesto Charon, MD  ASSISTANT:  Ellwood Handler, PA-C.  ANESTHESIA:  General.  FINDINGS:  1.75 liters of amber fluid evacuated. Lower lobe and lingula "trapped" with thin fibrous peel. Thin fibrous peel on parietal pleura. Improved reexpansion of the lung post-decortication.  CLINICAL NOTE:  Jeffrey [name] is a 63 year old gentleman with a complicated medical history including end-stage renal disease on chronic hemodialysis and aortic valve endocarditis requiring aortic valve replacement.  On followup after aortic valve  replacement, a chest x-ray showed a moderate to large left pleural effusion.  Thoracentesis partially drained the effusion, but he had rapid recurrence.  He was advised to undergo left VATS for drainage of the effusion and possible decortication of the  lung.  The indications, risks, benefits, and alternatives were discussed in detail with the patient.  He understood and accepted the risks and agreed to proceed.  OPERATIVE NOTE:  The patient was brought to the operating room on 11/04/2018.  He had induction of general anesthesia and was intubated with a double lumen endotracheal tube.  Intravenous vancomycin was administered.  Sequential compression devices were  placed on the calves for DVT prophylaxis.  He was placed in a right lateral decubitus position and the left chest was prepped and draped in the usual sterile fashion.  Single lung ventilation of the right lung was initiated and was  tolerated well  throughout the procedure.  A timeout was performed.  A solution containing 20 mL of liposomal bupivacaine, 30 mL of 0.5% bupivacaine and 50 mL of saline was prepared.  It was used for local at the incision sites as well as for the intercostal nerve blocks.  A site in the seventh  interspace in the mid axillary line was infiltrated with the bupivacaine solution.  A small port type incision was made.  The chest was entered bluntly using a hemostat.  A sucker was placed into the chest and 1.75 liters of amber fluid was evacuated.   This was sent for both cultures and cytology.  A 5 mm port was inserted through this incision and the thoracoscope was advanced into the chest.  The effusion was nearly completely drained.  There was a peel entrapping the lingula and the lower lobe.  A 4  cm working incision was made in the 5th interspace anterolaterally.  This area was infiltrated with bupivacaine solution before the incision was made.  No rib spreading was performed during the procedure.  Intercostal nerve blocks were performed from  the third to the ninth interspace.  A needle was inserted from a posterior approach and 10 ml of the bupivacaine solution was injected into a subpleural plane at each of these interspaces.  The peel then was removed.  Initially, the dissection started with the  lingula.  This was adhesed to the pericardium.  These adhesions came apart relatively easily.  The peel on the lung was slowly teased off.  There were some minor tears in the visceral pleura.  The fissure then was decorticated followed by the lower lobe.   The lower lobe adhesions were particularly severe inferiorly and along the diaphragm.  The  chest was copiously irrigated with warm saline.  A test inflation of the lungs showed some areas of the lung was still trapped and additional decortication was  performed in these areas and then there was markedly improved reexpansion.  A plane was then developed in the  parietal pleural peel between the peel in the chest wall.  The peel was stripped.  There was some diffuse bleeding with this.  There was no ongoing  bleeding at completion of the procedure.  A second port type incision was made anterior to the first.  A 28-French chest tube was placed through this incision and directed anteriorly.  A second 28-French Blake drain was placed through the original port  incision and directed posteriorly.  Dual-lung ventilation was resumed.  The incision was closed in standard fashion.  Chest tubes were secured with #1 silk sutures.  Chest tubes were placed to suction.  The patient was placed back in supine position.  He  was extubated in the operating room and taken to the [facility] in good condition.  TN/NUANCE  D:11/04/2018 T:11/04/2018 JOB:[id number]

## 2018-11-04 NOTE — Interval H&P Note (Signed)
History and Physical Interval Note:  11/04/2018 7:23 AM  Jeffrey [name]  has presented today for surgery, with the diagnosis of LEFT PLEURAL EFFUSION  The various methods of treatment have been discussed with the patient and family. After consideration of risks, benefits and other options for treatment, the patient has consented to  Procedure(s): VIDEO ASSISTED THORACOSCOPY (Left) DRAINAGE OF PLEURAL EFFUSION (Left) possible DECORTICATION (Left) as a surgical intervention .  The patient's history has been reviewed, patient examined, no change in status, stable for surgery.  I have reviewed the patient's chart and labs.  Questions were answered to the patient's satisfaction.     [name]

## 2018-11-04 NOTE — Anesthesia Procedure Notes (Signed)
Procedure Name: Intubation Date/Time: 11/04/2018 8:01 AM Performed by: Valda Favia, CRNA Pre-anesthesia Checklist: Patient identified, Emergency Drugs available, Suction available, Patient being monitored and Timeout performed Patient Re-evaluated:Patient Re-evaluated prior to induction Oxygen Delivery Method: Circle system utilized Preoxygenation: Pre-oxygenation with 100% oxygen Induction Type: IV induction Ventilation: Mask ventilation without difficulty Laryngoscope Size: Mac and 4 Grade View: Grade I Endobronchial tube: Left, Double lumen EBT, EBT position confirmed by fiberoptic bronchoscope and EBT position confirmed by auscultation and 41 Fr Number of attempts: 1 Intubation method: Vivasight bronchoscope tube. Secured at: 29 cm Tube secured with: Tape Dental Injury: Teeth and Oropharynx as per pre-operative assessment

## 2018-11-04 NOTE — Consult Note (Signed)
Reason for Consult: To manage dialysis and dialysis related needs Referring Physician: Dr. Roxan Hockey  HPI: Jeffrey [name] is an 63 y.o. male with ESRD on NxStage home hemo, HIV, HBV, h/o AVR 07/2018, HTN who is currently seen for management of ESRD while admitted.   Post AVR he developed recurrent pleural effusion which was managed today by VATS and decortication.  He is seen in hospital room following the surgery and feels ok currently.  He did his HHD yesterday PM.  Dialyzes at home Nx stage, UF </= 2L. L RC AVF Qb 400, 4x/wk, 3:30; Brother cannulates Heparin 5000 u bolus initiation Dr. Holley Raring   Past Medical History:  Diagnosis Date  . Anemia   . Anxiety   . Aortic valve stenosis   . Arthritis   . Asthma    per pt hx  . Dyspnea   . End stage renal disease on home HD 07/10/2011   Started HD in September 2012 at [facility] with a tunneled HD catheter, now on home HD with NxtStage. Dialyzing through AVF L lower arm with buttonhole technique as of mid 2014. His brother does the HD treatments at home.  They are roommates for 23 years.  The brother works 3rd shift and gets off about 8am and then puts Mr Elamin on HD in the morning after getting home. Most of the time he does HD about 4 times a week, for about 4 hours per treatment. Cause of ESRD was HTN according to patient. He says he let his health go and ending up with complications, and that he didn't like seeing doctors in those days.  He says he was diagnosed with severe HTN when he lived in [place] in his 59's.   . Hepatitis B carrier ([place])   . HIV infection ([place])   . Hypertension   . Hyperthyroidism    normal now  . Pneumonia several yrs ago  . Seizures ([place]) 06/02/2011   had a mild one in Sept. 2019  . Sleep apnea    to start Cpap soon  . Thrombocytopenia ([place])     Past Surgical History:  Procedure Laterality Date  . AORTIC VALVE REPLACEMENT N/A 07/28/2018   Procedure: AORTIC VALVE REPLACEMENT (AVR) using Inspiris Resilia   size 23 Aortic Valve.;  Surgeon: [name], MD;  Location: [place];  Service: Open Heart Surgery;  Laterality: N/A;  . AV FISTULA PLACEMENT  06/02/11   Left radiocephalic AVF  . BIOPSY  02/17/2018   Procedure: BIOPSY;  Surgeon: Milus Banister, MD;  Location: [facility];  Service: Endoscopy;;  . COLONOSCOPY    . COLONOSCOPY WITH PROPOFOL N/A 01/27/2018   Procedure: COLONOSCOPY WITH PROPOFOL;  Surgeon: [name], MD;  Location: [facility] ENDOSCOPY;  Service: Gastroenterology;  Laterality: N/A;  . CORONARY ANGIOPLASTY    . ERCP  03/21/2018   AT [place]  . ESOPHAGOGASTRODUODENOSCOPY N/A 02/17/2018   Procedure: ESOPHAGOGASTRODUODENOSCOPY (EGD);  Surgeon: Milus Banister, MD;  Location: [facility];  Service: Endoscopy;  Laterality: N/A;  . ESOPHAGOGASTRODUODENOSCOPY (EGD) WITH PROPOFOL N/A 01/27/2018   Procedure: ESOPHAGOGASTRODUODENOSCOPY (EGD) WITH PROPOFOL;  Surgeon: [name], MD;  Location: [facility] ENDOSCOPY;  Service: Gastroenterology;  Laterality: N/A;  . ESOPHAGOGASTRODUODENOSCOPY (EGD) WITH PROPOFOL N/A 04/03/2018   Procedure: ESOPHAGOGASTRODUODENOSCOPY (EGD) WITH PROPOFOL;  Surgeon: Clarene Essex, MD;  Location: [place];  Service: Endoscopy;  Laterality: N/A;  . EUS N/A 02/17/2018   Procedure: UPPER ENDOSCOPIC ULTRASOUND (EUS) RADIAL;  Surgeon: Milus Banister, MD;  Location: [facility]  ENDOS[facility];  Service: Endoscopy;  Laterality: N/A;  . EYE SURGERY Left 05/2018   cataract surgery   . fistulaogram     x 2 last 2 years  . IR DIALY SHUNT INTRO NEEDLE/INTRACATH INITIAL W/IMG LEFT Left 04/20/2018  . IR FLUORO GUIDE CV LINE RIGHT  09/16/2017  . IR THORACENTESIS ASP PLEURAL SPACE W/IMG GUIDE  10/04/2018  . IR US GUIDE VASC ACCESS RIGHT  09/16/2017  . IRRIGATION AND DEBRIDEMENT KNEE Left 09/15/2017   Procedure: IRRIGATION AND DEBRIDEMENT KNEE; arthroscopic clean out;  Surgeon: [name], MD;  Location: [facility];  Service: Orthopedics;  Laterality: Left;  . OTHER SURGICAL HISTORY      removal temporary HD catheter   . REVISON OF ARTERIOVENOUS FISTULA Left 10/10/2015   Procedure: REVISON OF LEFT RADIOCEPHALIC ARTERIOVENOUS FISTULA;  Surgeon: Angelia Mould, MD;  Location: [place];  Service: Vascular;  Laterality: Left;  . REVISON OF ARTERIOVENOUS FISTULA Left 02/07/2016   Procedure: REPAIR OF PSEUDO-ANEUREYSM OF LEFT ARM  ARTERIOVENOUS FISTULA;  Surgeon: Angelia Mould, MD;  Location: [place];  Service: Vascular;  Laterality: Left;  . REVISON OF ARTERIOVENOUS FISTULA Left 04/15/3663   Procedure: PLICATION OF LEFT ARM RADIOCEPHALIC ARTERIOVENOUS FISTULA PSEUDOANEURYSM;  Surgeon: Angelia Mould, MD;  Location: [place];  Service: Vascular;  Laterality: Left;  . REVISON OF ARTERIOVENOUS FISTULA Left 07/04/2018   Procedure: REVISION OF ARTERIOVENOUS RADIOCEPHALIC FISTULA;  Surgeon: Angelia Mould, MD;  Location: [place];  Service: Vascular;  Laterality: Left;  . RIGHT/LEFT HEART CATH AND CORONARY ANGIOGRAPHY N/A 02/17/2017   Procedure: Right/Left Heart Cath and Coronary Angiography;  Surgeon: Sherren Mocha, MD;  Location: [place] CV LAB;  Service: Cardiovascular;  Laterality: N/A;  . RIGHT/LEFT HEART CATH AND CORONARY ANGIOGRAPHY N/A 07/25/2018   Procedure: RIGHT/LEFT HEART CATH AND CORONARY ANGIOGRAPHY;  Surgeon: Leonie Man, MD;  Location: [place] [facility];  Service: Cardiovascular;  Laterality: N/A;  . TEE WITHOUT CARDIOVERSION N/A 09/11/2016   Procedure: TRANSESOPHAGEAL ECHOCARDIOGRAM (TEE);  Surgeon: Dorothy Spark, MD;  Location: [place];  Service: Cardiovascular;  Laterality: N/A;  . TEE WITHOUT CARDIOVERSION N/A 05/05/2018   Procedure: TRANSESOPHAGEAL ECHOCARDIOGRAM (TEE);  Surgeon: Adrian Prows, MD;  Location: [facility];  Service: Cardiovascular;  Laterality: N/A;  Prefer after 3:30 PM  . TEE WITHOUT CARDIOVERSION N/A 07/28/2018   Procedure: TRANSESOPHAGEAL ECHOCARDIOGRAM (TEE);  Surgeon: [name], MD;  Location: [place];   Service: Open Heart Surgery;  Laterality: N/A;  . VIDEO ASSISTED THORACOSCOPY (VATS)/THOROCOTOMY Left 11/04/2018    Family History  Problem Relation Age of Onset  . Hypertension Mother   . Diabetes Mother   . Cancer Father     Social History:  reports that he quit smoking about 3 years ago. His smoking use included cigarettes. He quit after 10.00 years of use. He has never used smokeless tobacco. He reports that he does not drink alcohol or use drugs.  Allergies:  Allergies  Allergen Reactions  . Lisinopril Anaphylaxis and Shortness Of Breath    Throat swelling  . Penicillins Anaphylaxis and Other (See Comments)    Childhood allergy Has patient had a PCN reaction causing immediate rash, facial/tongue/throat swelling, SOB or lightheadedness with hypotension: Yes Has patient had a PCN reaction causing severe rash involving mucus membranes or skin necrosis: Unk Has patient had a PCN reaction that required hospitalization: Unk Has patient had a PCN reaction occurring within the last 10 years: No If all of the above answers are "NO", then  may proceed with Cephalosporin use.   . Fentanyl Other (See Comments)    Lethargy, AMS  . Morphine And Related Other (See Comments)    "Not Himself"    Medications: I have reviewed the patient's current medications.  Results for orders placed or performed during the hospital encounter of 11/04/18 (from the past 48 hour(s))  I-STAT 4, (NA,K, GLUC, HGB,HCT)     Status: Abnormal   Collection Time: 11/04/18  6:23 AM  Result Value Ref Range   Sodium 136 135 - 145 mmol/L   Potassium 3.1 (L) 3.5 - 5.1 mmol/L   Glucose, Bld 110 (H) 70 - 99 mg/dL   HCT 31.0 (L) 39.0 - 52.0 %   Hemoglobin 10.5 (L) 13.0 - 17.0 g/dL  APTT     Status: None   Collection Time: 11/04/18  6:29 AM  Result Value Ref Range   aPTT 35 24 - 36 seconds    Comment: Performed at [facility], [address], [place], [place] [address]  Gram stain     Status: None    Collection Time: 11/04/18  8:41 AM  Result Value Ref Range   Specimen Description PLEURAL LEFT    Special Requests NONE    Gram Stain      RARE WBC PRESENT, PREDOMINANTLY MONONUCLEAR NO ORGANISMS SEEN Performed at [facility], [place] [address], [place], [place] [address]    Report Status 11/04/2018 FINAL   Aerobic/Anaerobic Culture (surgical/deep wound)     Status: None (Preliminary result)   Collection Time: 11/04/18  9:36 AM  Result Value Ref Range   Specimen Description TISSUE LEFT PARIETAL PEEL    Special Requests PATIENT ON FOLLOWING VANC    Gram Stain      RARE WBC PRESENT, PREDOMINANTLY MONONUCLEAR NO ORGANISMS SEEN Performed at [facility], [place] [address], [place], [place] [address]    Culture PENDING    Report Status PENDING     Dg Chest Port 1 View  Result Date: 11/04/2018 CLINICAL DATA:  Post VAT S, LEFT lung decortication, drainage of LEFT pleural effusion and Sir shin of LEFT chest tube EXAM: PORTABLE CHEST 1 VIEW COMPARISON:  Portable exam 1100 hours compared to 11/02/2018 FINDINGS: Pair of LEFT thoracostomy tubes now identified. Enlargement of cardiac silhouette post median sternotomy and AVR. Mild interstitial infiltrates question pulmonary edema. Decreased LEFT pleural effusion and atelectasis since previous study. No pneumothorax. Degenerative changes LEFT shoulder. IMPRESSION: Decrease in LEFT pleural effusion and basilar atelectasis post surgery and chest tube placement. Enlargement of cardiac silhouette post AVR with question minimal pulmonary edema. Electronically Signed   By: Lavonia Dana M.D.   On: 11/04/2018 12:51    ROS: 12 system ROS per HPI listed above.   Blood pressure (!) 144/85, pulse 69, temperature 97.6 F (36.4 C), resp. rate 17, height 6' (1.829 m), weight 79.8 kg, SpO2 97 %. Gen: nontoxic Eyes: anicteric ENT: MMM CV: RRR Lungs: chest tube with bloody drainage Abd: soft Extr: trace ankle edema, L RC AVF with  t/b+  Assessment/Plan: **ESRD on Home hemo 4x/wk:  Will dialyze tomorrow, heparin free given surgery.    **Anemia of CKD:  Hb 10.5 pre op.  Monitor post op.  Transfuse PRN.  If remains hospitalized will review ESA dosing and continue  **BMM:  Per home med list no calcitriol, sensipar.  Watch Ca, phos here.   **HIV: on ARVs.  **HTN: reasonable BP currently.  Hydralazine 100 TID, ISMN 20 BID, losartan 25,  clonidine patch.   Justin Mend 11/04/2018, 3:46 PM

## 2018-11-05 ENCOUNTER — Encounter (HOSPITAL_COMMUNITY): Payer: Self-pay | Admitting: Thoracic Surgery (Cardiothoracic Vascular Surgery)

## 2018-11-05 ENCOUNTER — Inpatient Hospital Stay (HOSPITAL_COMMUNITY): Payer: Medicare Other

## 2018-11-05 LAB — BASIC METABOLIC PANEL
Anion gap: 16 — ABNORMAL HIGH (ref 5–15)
BUN: 41 mg/dL — ABNORMAL HIGH (ref 8–23)
CO2: 21 mmol/L — ABNORMAL LOW (ref 22–32)
Calcium: 8.4 mg/dL — ABNORMAL LOW (ref 8.9–10.3)
Chloride: 97 mmol/L — ABNORMAL LOW (ref 98–111)
Creatinine, Ser: 7.7 mg/dL — ABNORMAL HIGH (ref 0.61–1.24)
GFR calc Af Amer: 8 mL/min — ABNORMAL LOW (ref >60)
GFR calc non Af Amer: 7 mL/min — ABNORMAL LOW (ref >60)
Glucose, Bld: 116 mg/dL — ABNORMAL HIGH (ref 70–99)
Potassium: 3.7 mmol/L (ref 3.5–5.1)
Sodium: 134 mmol/L — ABNORMAL LOW (ref 135–145)

## 2018-11-05 LAB — CBC
HCT: 29.8 % — ABNORMAL LOW (ref 39.0–52.0)
Hemoglobin: 9.3 g/dL — ABNORMAL LOW (ref 13.0–17.0)
MCH: 29.2 pg (ref 26.0–34.0)
MCHC: 31.2 g/dL (ref 30.0–36.0)
MCV: 93.7 fL (ref 80.0–100.0)
Platelets: 182 10*3/uL (ref 150–400)
RBC: 3.18 MIL/uL — ABNORMAL LOW (ref 4.22–5.81)
RDW: 16.3 % — ABNORMAL HIGH (ref 11.5–15.5)
WBC: 11 10*3/uL — ABNORMAL HIGH (ref 4.0–10.5)
nRBC: 0 % (ref 0.0–0.2)

## 2018-11-05 LAB — BLOOD GAS, ARTERIAL
Acid-base deficit: 0.6 mmol/L (ref 0.0–2.0)
Bicarbonate: 24.3 mmol/L (ref 20.0–28.0)
O2 Content: 2 L/min
O2 SAT: 95.9 %
PCO2 ART: 45.3 mmHg (ref 32.0–48.0)
Patient temperature: 98.6
pH, Arterial: 7.349 — ABNORMAL LOW (ref 7.350–7.450)
pO2, Arterial: 81.3 mmHg — ABNORMAL LOW (ref 83.0–108.0)

## 2018-11-05 LAB — ACID FAST SMEAR (AFB, MYCOBACTERIA): Acid Fast Smear: NEGATIVE

## 2018-11-05 LAB — ACID FAST SMEAR (AFB): ACID FAST SMEAR - AFSCU2: NEGATIVE

## 2018-11-05 MED ORDER — ORAL CARE MOUTH RINSE
15.0000 mL | Freq: Two times a day (BID) | OROMUCOSAL | Status: DC
  Administered 2018-11-05 – 2018-11-08 (×5): 15 mL via OROMUCOSAL

## 2018-11-05 MED ORDER — RENA-VITE PO TABS
1.0000 | ORAL_TABLET | Freq: Every day | ORAL | Status: DC
  Administered 2018-11-06 – 2018-11-10 (×5): 1 via ORAL
  Filled 2018-11-05 (×6): qty 1

## 2018-11-05 MED ORDER — ALTEPLASE 2 MG IJ SOLR: 2.0000 mg | Freq: Once | INTRAMUSCULAR | Status: DC | PRN

## 2018-11-05 MED ORDER — HEPARIN SODIUM (PORCINE) 1000 UNIT/ML DIALYSIS: 1000.0000 [IU] | INTRAMUSCULAR | Status: DC | PRN

## 2018-11-05 MED ORDER — LIDOCAINE-PRILOCAINE 2.5-2.5 % EX CREA: 1.0000 "application " | TOPICAL_CREAM | CUTANEOUS | Status: DC | PRN

## 2018-11-05 MED ORDER — PENTAFLUOROPROP-TETRAFLUOROETH EX AERO: 1.0000 "application " | INHALATION_SPRAY | CUTANEOUS | Status: DC | PRN

## 2018-11-05 MED ORDER — CHLORHEXIDINE GLUCONATE CLOTH 2 % EX PADS
6.0000 | MEDICATED_PAD | Freq: Every day | CUTANEOUS | Status: DC
  Administered 2018-11-05 – 2018-11-06 (×2): 6 via TOPICAL

## 2018-11-05 MED ORDER — SODIUM CHLORIDE 0.9 % IV SOLN: 100.0000 mL | INTRAVENOUS | Status: DC | PRN

## 2018-11-05 MED ORDER — LIDOCAINE HCL (PF) 1 % IJ SOLN: 5.0000 mL | INTRAMUSCULAR | Status: DC | PRN

## 2018-11-05 NOTE — Progress Notes (Signed)
[name] Progress Note   Subjective:   Post op pain o/w ok.    Objective Vitals:   11/05/18 0700 11/05/18 0728 11/05/18 0800 11/05/18 0900  BP:   (!) 164/90   Pulse: 73  80 88  Resp: 20 18 (!) 22 (!) 26  Temp:   98 F (36.7 C)   TempSrc:   Oral   SpO2: 98% 96% 97% 92%  Weight:      Height:       Physical Exam Gen: nontoxic Eyes: anicteric ENT: MMM CV: RRR Lungs: chest tube with bloody drainage Abd: soft Extr: trace ankle edema, L RC AVF with t/b+  Additional Objective Labs: Basic Metabolic Panel: Recent Labs  Lab 11/02/18 1101 11/04/18 0623 11/05/18 0305  NA 136 136 134*  K 3.7 3.1* 3.7  CL 97*  --  97*  CO2 22  --  21*  GLUCOSE 113* 110* 116*  BUN 38*  --  41*  CREATININE 7.46*  --  7.70*  CALCIUM 9.0  --  8.4*   Liver Function Tests: Recent Labs  Lab 11/02/18 1101  AST 50*  ALT 15  ALKPHOS 63  BILITOT 0.7  PROT 7.2  ALBUMIN 3.5   No results for input(s): LIPASE, AMYLASE in the last 168 hours. CBC: Recent Labs  Lab 11/02/18 1101 11/04/18 0623 11/04/18 1748 11/05/18 0305  WBC 6.9  --  12.0* 11.0*  HGB 10.2* 10.5* 10.0* 9.3*  HCT 31.9* 31.0* 31.7* 29.8*  MCV 96.1  --  96.6 93.7  PLT 203  --  183 182   Blood Culture    Component Value Date/Time   SDES TISSUE LEFT PARIETAL PEEL 11/04/2018 0936   SPECREQUEST PATIENT ON FOLLOWING VANC 11/04/2018 0936   CULT  11/04/2018 0936    NO GROWTH < 24 HOURS Performed at [facility], [place] [address] [place], [place] [address]    REPTSTATUS PENDING 11/04/2018 4481    Cardiac Enzymes: No results for input(s): CKTOTAL, CKMB, CKMBINDEX, TROPONINI in the last 168 hours. CBG: No results for input(s): GLUCAP in the last 168 hours. Iron Studies: No results for input(s): IRON, TIBC, TRANSFERRIN, FERRITIN in the last 72 hours. @lablastinr3 @ Studies/Results: Dg Chest Port 1 View  Result Date: 11/05/2018 CLINICAL DATA:  Postop left thoracotomy and pleural decortication. EXAM:  PORTABLE CHEST 1 VIEW COMPARISON:  11/04/2018 and 11/02/2018. FINDINGS: 0550 hours. Two left-sided chest tubes remain in place. The volume of residual pleural fluid/thickening on the left is unchanged. There is stable left basilar pulmonary opacity. The right lung is clear. The heart size and mediastinal contours are stable status post median sternotomy and aortic valve replacement. No pneumothorax. IMPRESSION: Stable postoperative chest. Electronically Signed   By: Richardean Sale M.D.   On: 11/05/2018 08:25   Dg Chest Port 1 View  Result Date: 11/04/2018 CLINICAL DATA:  Post VAT S, LEFT lung decortication, drainage of LEFT pleural effusion and Sir shin of LEFT chest tube EXAM: PORTABLE CHEST 1 VIEW COMPARISON:  Portable exam 1100 hours compared to 11/02/2018 FINDINGS: Pair of LEFT thoracostomy tubes now identified. Enlargement of cardiac silhouette post median sternotomy and AVR. Mild interstitial infiltrates question pulmonary edema. Decreased LEFT pleural effusion and atelectasis since previous study. No pneumothorax. Degenerative changes LEFT shoulder. IMPRESSION: Decrease in LEFT pleural effusion and basilar atelectasis post surgery and chest tube placement. Enlargement of cardiac silhouette post AVR with question minimal pulmonary edema. Electronically Signed   By: Lavonia Dana M.D.   On: 11/04/2018  12:51   Medications: . sodium chloride 10 mL/hr at 11/04/18 2000  . [START ON 11/07/2018] ferric gluconate (FERRLECIT/NULECIT) IV    . potassium chloride     . acetaminophen  1,000 mg Oral Q6H   Or  . acetaminophen (TYLENOL) oral liquid 160 mg/5 mL  1,000 mg Oral Q6H  . amLODipine  10 mg Oral Daily  . aspirin EC  81 mg Oral Daily  . bictegravir-emtricitabine-tenofovir AF  1 tablet Oral Daily  . bisacodyl  10 mg Oral Daily  . Chlorhexidine Gluconate Cloth  6 each Topical Q0600  . [START ON 11/12/2018] cloNIDine  0.3 mg Transdermal Q Sat  . enoxaparin (LOVENOX) injection  40 mg Subcutaneous Q24H  .  feeding supplement (PRO-STAT SUGAR FREE 64)  30 mL Oral Daily  . hydrALAZINE  100 mg Oral TID  . isosorbide mononitrate  20 mg Oral BID  . lacosamide  150 mg Oral BID  . losartan  25 mg Oral Daily  . mouth rinse  15 mL Mouth Rinse BID  . metoCLOPramide (REGLAN) injection  10 mg Intravenous Q6H  . morphine   Intravenous Q4H  . multivitamin  1 tablet Oral Daily  . pantoprazole  40 mg Oral Daily  . senna-docusate  1 tablet Oral QHS    Dialysis Orders:Dialyzes at home Nx stage, UF </= 2L. L RC AVF Qb 400, 4x/wk, 3:30; Brother cannulates Heparin 5000 u bolus initiation  Assessment/Plan: Assessment/Plan: **ESRD on Home hemo 4x/wk:  Will dialyze today, heparin free given surgery.  UF 1.5L as tolerated.  **Anemia of CKD:  Hb 9.3 this am from 10 yest.  Transfuse PRN.  If remains hospitalized will review ESA dosing and continue  **BMM:  Per home med list no calcitriol, sensipar.  Watch Ca, phos here.   **HIV: on ARVs.  **HTN: hypertensive with meds held pre HD.  Hydralazine 100 TID, ISMN 20 BID, losartan 25, clonidine patch. CTM.  Jannifer Hick MD 11/05/2018, 10:29 AM  [facility] Pager: [phone]

## 2018-11-05 NOTE — Progress Notes (Signed)
Pt left the unit for HD at 11.50pm

## 2018-11-05 NOTE — Progress Notes (Signed)
Pt's BP running higher side, called HD to clarify they said to hold BP medicines for now

## 2018-11-05 NOTE — Progress Notes (Addendum)
[address]       [place] [address]             [phone]      1 Day Post-Op Procedure(s) (LRB): VIDEO ASSISTED THORACOSCOPY (Left) DRAINAGE OF PLEURAL EFFUSION (Left) Left Lung DECORTICATION (Left) Chest Tube Insertion (Left) Subjective: C/o pain  Objective: Vital signs in last 24 hours: Temp:  [97.3 F (36.3 C)-98.8 F (37.1 C)] 98 F (36.7 C) (02/15 0800) Pulse Rate:  [51-88] 88 (02/15 0900) Cardiac Rhythm: Normal sinus rhythm (02/15 0700) Resp:  [11-26] 26 (02/15 0900) BP: (130-164)/(79-101) 164/90 (02/15 0800) SpO2:  [92 %-100 %] 92 % (02/15 0900) Arterial Line BP: (155-204)/(58-96) 204/96 (02/15 0800) Weight:  [79.8 kg] 79.8 kg (02/14 1315)  Hemodynamic parameters for last 24 hours:    Intake/Output from previous day: 02/14 0701 - 02/15 0700 In: 2060 [P.O.:720; I.V.:1140; IV Piggyback:200] Out: 2740 [Blood:50; Chest Tube:940] Intake/Output this shift: No intake/output data recorded.  General appearance: cooperative, fatigued and no distress Heart: regular rate and rhythm and harsh systolic murmur Lungs: dim in bases Abdomen: non tender, mild distension Extremities: PAS in place Wound: dressings CDI  Lab Results: Recent Labs    11/04/18 1748 11/05/18 0305  WBC 12.0* 11.0*  HGB 10.0* 9.3*  HCT 31.7* 29.8*  PLT 183 182   BMET:  Recent Labs    11/02/18 1101 11/04/18 0623 11/05/18 0305  NA 136 136 134*  K 3.7 3.1* 3.7  CL 97*  --  97*  CO2 22  --  21*  GLUCOSE 113* 110* 116*  BUN 38*  --  41*  CREATININE 7.46*  --  7.70*  CALCIUM 9.0  --  8.4*    PT/INR:  Recent Labs    11/02/18 1101  LABPROT 13.8  INR 1.07   ABG    Component Value Date/Time   PHART 7.349 (L) 11/05/2018 0315   HCO3 24.3 11/05/2018 0315   TCO2 21 (L) 07/29/2018 1609   ACIDBASEDEF 0.6 11/05/2018 0315   O2SAT 95.9 11/05/2018 0315   CBG (last 3)  No results for input(s): GLUCAP in the last 72 hours.  Meds Scheduled Meds: .  acetaminophen  1,000 mg Oral Q6H   Or  . acetaminophen (TYLENOL) oral liquid 160 mg/5 mL  1,000 mg Oral Q6H  . amLODipine  10 mg Oral Daily  . aspirin EC  81 mg Oral Daily  . bictegravir-emtricitabine-tenofovir AF  1 tablet Oral Daily  . bisacodyl  10 mg Oral Daily  . Chlorhexidine Gluconate Cloth  6 each Topical Q0600  . [START ON 11/12/2018] cloNIDine  0.3 mg Transdermal Q Sat  . enoxaparin (LOVENOX) injection  40 mg Subcutaneous Q24H  . feeding supplement (PRO-STAT SUGAR FREE 64)  30 mL Oral Daily  . hydrALAZINE  100 mg Oral TID  . isosorbide mononitrate  20 mg Oral BID  . lacosamide  150 mg Oral BID  . losartan  25 mg Oral Daily  . mouth rinse  15 mL Mouth Rinse BID  . metoCLOPramide (REGLAN) injection  10 mg Intravenous Q6H  . morphine   Intravenous Q4H  . multivitamin  1 tablet Oral Daily  . pantoprazole  40 mg Oral Daily  . senna-docusate  1 tablet Oral QHS   Continuous Infusions: . sodium chloride 10 mL/hr at 11/04/18 2000  . [START ON 11/07/2018] ferric gluconate (FERRLECIT/NULECIT) IV    . potassium chloride     PRN Meds:.albuterol, diphenhydrAMINE **OR** diphenhydrAMINE, naloxone **AND**  sodium chloride flush, ondansetron (ZOFRAN) IV, ondansetron (ZOFRAN) IV, oxyCODONE, potassium chloride, traMADol, traZODone  Xrays Dg Chest Port 1 View  Result Date: 11/05/2018 CLINICAL DATA:  Postop left thoracotomy and pleural decortication. EXAM: PORTABLE CHEST 1 VIEW COMPARISON:  11/04/2018 and 11/02/2018. FINDINGS: 0550 hours. Two left-sided chest tubes remain in place. The volume of residual pleural fluid/thickening on the left is unchanged. There is stable left basilar pulmonary opacity. The right lung is clear. The heart size and mediastinal contours are stable status post median sternotomy and aortic valve replacement. No pneumothorax. IMPRESSION: Stable postoperative chest. Electronically Signed   By: Richardean Sale M.D.   On: 11/05/2018 08:25   Dg Chest Port 1 View  Result  Date: 11/04/2018 CLINICAL DATA:  Post VAT S, LEFT lung decortication, drainage of LEFT pleural effusion and Sir shin of LEFT chest tube EXAM: PORTABLE CHEST 1 VIEW COMPARISON:  Portable exam 1100 hours compared to 11/02/2018 FINDINGS: Pair of LEFT thoracostomy tubes now identified. Enlargement of cardiac silhouette post median sternotomy and AVR. Mild interstitial infiltrates question pulmonary edema. Decreased LEFT pleural effusion and atelectasis since previous study. No pneumothorax. Degenerative changes LEFT shoulder. IMPRESSION: Decrease in LEFT pleural effusion and basilar atelectasis post surgery and chest tube placement. Enlargement of cardiac silhouette post AVR with question minimal pulmonary edema. Electronically Signed   By: Lavonia Dana M.D.   On: 11/04/2018 12:51    Assessment/Plan: S/P Procedure(s) (LRB): VIDEO ASSISTED THORACOSCOPY (Left) DRAINAGE OF PLEURAL EFFUSION (Left) Left Lung DECORTICATION (Left) Chest Tube Insertion (Left)  1 stable, moderate CT drainage, 840 yesterday, 100 cc overnight, H/H holding pretty well- keep in place 2 plan for dialysis at noon, hypertensive- nephrology consult in place, on mult meds 3 CXR is stable in appearance 4 BS  adeq control 5 d/c aline 6 routine pulm toilet/rehab as able, prn nebs 7 cont PCA   LOS: 1 day    Jeffrey [name] [facility] 11/05/2018  Pager [phone]  Chest x-ray performed today personally reviewed and is satisfactory for postop day 1 decortication Leave tubes in for significant drainage Continue antibiotics Operative cultures show no growth, rare WBC without organism  patient examined and medical record reviewed,agree with above note. Jeffrey [name] 11/05/2018

## 2018-11-06 ENCOUNTER — Inpatient Hospital Stay (HOSPITAL_COMMUNITY): Payer: Medicare Other

## 2018-11-06 LAB — CBC
HCT: 28.7 % — ABNORMAL LOW (ref 39.0–52.0)
Hemoglobin: 8.7 g/dL — ABNORMAL LOW (ref 13.0–17.0)
MCH: 29 pg (ref 26.0–34.0)
MCHC: 30.3 g/dL (ref 30.0–36.0)
MCV: 95.7 fL (ref 80.0–100.0)
Platelets: 185 10*3/uL (ref 150–400)
RBC: 3 MIL/uL — ABNORMAL LOW (ref 4.22–5.81)
RDW: 16.4 % — ABNORMAL HIGH (ref 11.5–15.5)
WBC: 10.4 10*3/uL (ref 4.0–10.5)
nRBC: 0 % (ref 0.0–0.2)

## 2018-11-06 LAB — COMPREHENSIVE METABOLIC PANEL
ALK PHOS: 61 U/L (ref 38–126)
ALT: 9 U/L (ref 0–44)
AST: 14 U/L — ABNORMAL LOW (ref 15–41)
Albumin: 2.8 g/dL — ABNORMAL LOW (ref 3.5–5.0)
Anion gap: 7 (ref 5–15)
BUN: 25 mg/dL — ABNORMAL HIGH (ref 8–23)
CO2: 29 mmol/L (ref 22–32)
CREATININE: 5.37 mg/dL — AB (ref 0.61–1.24)
Calcium: 8.3 mg/dL — ABNORMAL LOW (ref 8.9–10.3)
Chloride: 99 mmol/L (ref 98–111)
GFR calc Af Amer: 12 mL/min — ABNORMAL LOW (ref >60)
GFR calc non Af Amer: 10 mL/min — ABNORMAL LOW (ref >60)
Glucose, Bld: 117 mg/dL — ABNORMAL HIGH (ref 70–99)
Potassium: 3.8 mmol/L (ref 3.5–5.1)
Sodium: 135 mmol/L (ref 135–145)
Total Bilirubin: 0.6 mg/dL (ref 0.3–1.2)
Total Protein: 6.5 g/dL (ref 6.5–8.1)

## 2018-11-06 MED ORDER — DARBEPOETIN ALFA 150 MCG/0.3ML IJ SOSY
150.0000 ug | PREFILLED_SYRINGE | INTRAMUSCULAR | Status: DC
  Administered 2018-11-07: 150 ug via INTRAVENOUS
  Filled 2018-11-06: qty 0.3

## 2018-11-06 MED ORDER — ACETAMINOPHEN 160 MG/5ML PO SOLN
1000.0000 mg | Freq: Four times a day (QID) | ORAL | Status: AC
  Administered 2018-11-07 (×2): 1000 mg via ORAL
  Filled 2018-11-06 (×2): qty 40.6

## 2018-11-06 MED ORDER — ACETAMINOPHEN 500 MG PO TABS
1000.0000 mg | ORAL_TABLET | Freq: Four times a day (QID) | ORAL | Status: AC
  Administered 2018-11-06 – 2018-11-09 (×5): 1000 mg via ORAL
  Filled 2018-11-06 (×6): qty 2

## 2018-11-06 MED ORDER — EPOETIN ALFA 20000 UNIT/ML IJ SOLN: 20000.0000 [IU] | INTRAMUSCULAR | Status: DC

## 2018-11-06 MED ORDER — CHLORHEXIDINE GLUCONATE CLOTH 2 % EX PADS
6.0000 | MEDICATED_PAD | Freq: Every day | CUTANEOUS | Status: DC
  Administered 2018-11-06 – 2018-11-08 (×3): 6 via TOPICAL

## 2018-11-06 MED ORDER — ENOXAPARIN SODIUM 30 MG/0.3ML ~~LOC~~ SOLN
30.0000 mg | SUBCUTANEOUS | Status: DC
  Administered 2018-11-07 – 2018-11-10 (×4): 30 mg via SUBCUTANEOUS
  Filled 2018-11-06 (×5): qty 0.3

## 2018-11-06 NOTE — Progress Notes (Signed)
[facility] Progress Note   Subjective:   HD yesterday without incident. UF 1.5L.  Objective Vitals:   11/05/18 2340 11/05/18 2349 11/06/18 0445 11/06/18 0451  BP: (!) 144/83   (!) 144/74  Pulse: 94   91  Resp: (!) 22 (!) 27 16 20   Temp: 99.2 F (37.3 C)   99.4 F (37.4 C)  TempSrc: Oral   Oral  SpO2: 93% 92% 96% 93%  Weight:      Height:       Physical Exam Gen: nontoxic Eyes: anicteric ENT: MMM CV: RRR Lungs: chest tube with bloody drainage Abd: soft Extr: no ankle edema, L RC AVF with t/b+  Additional Objective Labs: Basic Metabolic Panel: Recent Labs  Lab 11/02/18 1101 11/04/18 0623 11/05/18 0305 11/06/18 0238  NA 136 136 134* 135  K 3.7 3.1* 3.7 3.8  CL 97*  --  97* 99  CO2 22  --  21* 29  GLUCOSE 113* 110* 116* 117*  BUN 38*  --  41* 25*  CREATININE 7.46*  --  7.70* 5.37*  CALCIUM 9.0  --  8.4* 8.3*   Liver Function Tests: Recent Labs  Lab 11/02/18 1101 11/06/18 0238  AST 50* 14*  ALT 15 9  ALKPHOS 63 61  BILITOT 0.7 0.6  PROT 7.2 6.5  ALBUMIN 3.5 2.8*   No results for input(s): LIPASE, AMYLASE in the last 168 hours. CBC: Recent Labs  Lab 11/02/18 1101  11/04/18 1748 11/05/18 0305 11/06/18 0238  WBC 6.9  --  12.0* 11.0* 10.4  HGB 10.2*   < > 10.0* 9.3* 8.7*  HCT 31.9*   < > 31.7* 29.8* 28.7*  MCV 96.1  --  96.6 93.7 95.7  PLT 203  --  183 182 185   < > = values in this interval not displayed.   Blood Culture    Component Value Date/Time   SDES TISSUE LEFT PARIETAL PEEL 11/04/2018 0936   SPECREQUEST PATIENT ON FOLLOWING VANC 11/04/2018 0936   CULT  11/04/2018 0936    NO GROWTH < 24 HOURS Performed at [facility], [place] [address] [place], [place] [address]    REPTSTATUS PENDING 11/04/2018 1638    Cardiac Enzymes: No results for input(s): CKTOTAL, CKMB, CKMBINDEX, TROPONINI in the last 168 hours. CBG: No results for input(s): GLUCAP in the last 168 hours. Iron Studies: No results for input(s): IRON,  TIBC, TRANSFERRIN, FERRITIN in the last 72 hours. @lablastinr3 @ Studies/Results: Dg Chest Port 1 View  Result Date: 11/05/2018 CLINICAL DATA:  Postop left thoracotomy and pleural decortication. EXAM: PORTABLE CHEST 1 VIEW COMPARISON:  11/04/2018 and 11/02/2018. FINDINGS: 0550 hours. Two left-sided chest tubes remain in place. The volume of residual pleural fluid/thickening on the left is unchanged. There is stable left basilar pulmonary opacity. The right lung is clear. The heart size and mediastinal contours are stable status post median sternotomy and aortic valve replacement. No pneumothorax. IMPRESSION: Stable postoperative chest. Electronically Signed   By: Richardean Sale M.D.   On: 11/05/2018 08:25   Dg Chest Port 1 View  Result Date: 11/04/2018 CLINICAL DATA:  Post VAT S, LEFT lung decortication, drainage of LEFT pleural effusion and Sir shin of LEFT chest tube EXAM: PORTABLE CHEST 1 VIEW COMPARISON:  Portable exam 1100 hours compared to 11/02/2018 FINDINGS: Pair of LEFT thoracostomy tubes now identified. Enlargement of cardiac silhouette post median sternotomy and AVR. Mild interstitial infiltrates question pulmonary edema. Decreased LEFT pleural effusion and atelectasis since previous study. No  pneumothorax. Degenerative changes LEFT shoulder. IMPRESSION: Decrease in LEFT pleural effusion and basilar atelectasis post surgery and chest tube placement. Enlargement of cardiac silhouette post AVR with question minimal pulmonary edema. Electronically Signed   By: Lavonia Dana M.D.   On: 11/04/2018 12:51   Medications: . sodium chloride 10 mL/hr at 11/04/18 2000  . [START ON 11/07/2018] ferric gluconate (FERRLECIT/NULECIT) IV    . potassium chloride     . acetaminophen  1,000 mg Oral Q6H   Or  . acetaminophen (TYLENOL) oral liquid 160 mg/5 mL  1,000 mg Oral Q6H  . amLODipine  10 mg Oral Daily  . aspirin EC  81 mg Oral Daily  . bictegravir-emtricitabine-tenofovir AF  1 tablet Oral Daily  .  bisacodyl  10 mg Oral Daily  . Chlorhexidine Gluconate Cloth  6 each Topical Q0600  . [START ON 11/12/2018] cloNIDine  0.3 mg Transdermal Q Sat  . enoxaparin (LOVENOX) injection  40 mg Subcutaneous Q24H  . feeding supplement (PRO-STAT SUGAR FREE 64)  30 mL Oral Daily  . hydrALAZINE  100 mg Oral TID  . isosorbide mononitrate  20 mg Oral BID  . lacosamide  150 mg Oral BID  . losartan  25 mg Oral Daily  . mouth rinse  15 mL Mouth Rinse BID  . morphine   Intravenous Q4H  . multivitamin  1 tablet Oral QHS  . pantoprazole  40 mg Oral Daily  . senna-docusate  1 tablet Oral QHS    Dialysis Orders:Dialyzes at home Nx stage, UF </= 2L. L RC AVF Qb 400, 4x/wk, 3:30; Brother cannulates Heparin 5000 u bolus initiation  Assessment/Plan: Assessment/Plan: **ESRD on Home hemo 4x/wk:  HD 2/15, next 2/17, heparin free given surgery.  UF 1.5L as tolerated.  **Anemia of CKD:  Hb 8.7 this am from 10 2/14. Epogen 14000 TIW at home, resume at 20000 TIW here. Add on iron indices too and replete PRN.   Transfuse PRN.     **BMM:  Per home med list no calcitriol, sensipar.  Watch Ca, phos here.   **HIV: on ARVs.  **HTN: normotensive.  Hydralazine 100 TID, ISMN 20 BID, losartan 25, clonidine patch. CTM.  Jannifer Hick MD 11/06/2018, 7:24 AM  [facility] Pager: [phone]

## 2018-11-06 NOTE — Plan of Care (Signed)
  Problem: Education: Goal: Knowledge of General Education information will improve Description Including pain rating scale, medication(s)/side effects and non-pharmacologic comfort measures Outcome: Progressing   

## 2018-11-06 NOTE — Progress Notes (Addendum)
[address]       [place] [address]             [phone]      2 Days Post-Op Procedure(s) (LRB): VIDEO ASSISTED THORACOSCOPY (Left) DRAINAGE OF PLEURAL EFFUSION (Left) Left Lung DECORTICATION (Left) Chest Tube Insertion (Left) Subjective: In a little better spirits today, c/o some itching  Objective: Vital signs in last 24 hours: Temp:  [97.9 F (36.6 C)-100.2 F (37.9 C)] 98.3 F (36.8 C) (02/16 0852) Pulse Rate:  [76-107] 91 (02/16 0451) Cardiac Rhythm: Normal sinus rhythm (02/16 0852) Resp:  [16-29] 21 (02/16 0824) BP: (138-170)/(74-96) 144/74 (02/16 0451) SpO2:  [91 %-100 %] 92 % (02/16 0824) Weight:  [76.2 kg-77.9 kg] 76.2 kg (02/15 1618)  Hemodynamic parameters for last 24 hours:    Intake/Output from previous day: 02/15 0701 - 02/16 0700 In: 953.7 [P.O.:720; I.V.:233.7] Out: 1860 [Chest Tube:360] Intake/Output this shift: Total I/O In: 120 [P.O.:120] Out: 20 [Chest Tube:20]  General appearance: alert, cooperative and no distress Heart: regular rate and rhythm and loud systolic murmur Lungs: dim in bases Abdomen: mild dist, soft, nontender Extremities: no edema Wound: incis healing well  Lab Results: Recent Labs    11/05/18 0305 11/06/18 0238  WBC 11.0* 10.4  HGB 9.3* 8.7*  HCT 29.8* 28.7*  PLT 182 185  Iod distension, soft, nontender,  BMET:  Recent Labs    11/05/18 0305 11/06/18 0238  NA 134* 135  K 3.7 3.8  CL 97* 99  CO2 21* 29  GLUCOSE 116* 117*  BUN 41* 25*  CREATININE 7.70* 5.37*  CALCIUM 8.4* 8.3*    PT/INR: No results for input(s): LABPROT, INR in the last 72 hours. ABG    Component Value Date/Time   PHART 7.349 (L) 11/05/2018 0315   HCO3 24.3 11/05/2018 0315   TCO2 21 (L) 07/29/2018 1609   ACIDBASEDEF 0.6 11/05/2018 0315   O2SAT 95.9 11/05/2018 0315   CBG (last 3)  No results for input(s): GLUCAP in the last 72 hours.  Meds Scheduled Meds: . acetaminophen  1,000 mg Oral Q6H   Or  .  acetaminophen (TYLENOL) oral liquid 160 mg/5 mL  1,000 mg Oral Q6H  . amLODipine  10 mg Oral Daily  . aspirin EC  81 mg Oral Daily  . bictegravir-emtricitabine-tenofovir AF  1 tablet Oral Daily  . bisacodyl  10 mg Oral Daily  . Chlorhexidine Gluconate Cloth  6 each Topical Q0600  . Chlorhexidine Gluconate Cloth  6 each Topical Q0600  . [START ON 11/12/2018] cloNIDine  0.3 mg Transdermal Q Sat  . [START ON 11/07/2018] Darbepoetin Alfa  150 mcg Intravenous Q Mon-HD  . enoxaparin (LOVENOX) injection  40 mg Subcutaneous Q24H  . feeding supplement (PRO-STAT SUGAR FREE 64)  30 mL Oral Daily  . hydrALAZINE  100 mg Oral TID  . isosorbide mononitrate  20 mg Oral BID  . lacosamide  150 mg Oral BID  . losartan  25 mg Oral Daily  . mouth rinse  15 mL Mouth Rinse BID  . morphine   Intravenous Q4H  . multivitamin  1 tablet Oral QHS  . pantoprazole  40 mg Oral Daily  . senna-docusate  1 tablet Oral QHS   Continuous Infusions: . sodium chloride 10 mL/hr at 11/04/18 2000  . [START ON 11/07/2018] ferric gluconate (FERRLECIT/NULECIT) IV    . potassium chloride     PRN Meds:.albuterol, diphenhydrAMINE **OR** diphenhydrAMINE, naloxone **AND** sodium chloride flush, ondansetron (ZOFRAN)  IV, ondansetron (ZOFRAN) IV, oxyCODONE, potassium chloride, traMADol, traZODone  Xrays Dg Chest Port 1 View  Result Date: 11/06/2018 CLINICAL DATA:  Postop day 2 LEFT VATS for decortication of empyema. EXAM: PORTABLE CHEST 1 VIEW COMPARISON:  11/05/2018 and earlier. FINDINGS: Two LEFT chest tubes remain in place with no change in the residual loculated LEFT pleural effusion and/or pleural thickening. Stable airspace consolidation in the LEFT LOWER LOBE and linear atelectasis in the LEFT UPPER LOBE. Pulmonary vascularity normal. No new pulmonary parenchymal abnormalities. Prior sternotomy for aortic valve replacement. Cardiac silhouette moderately enlarged, unchanged. IMPRESSION: 1. Stable residual loculated LEFT pleural  effusion and/or pleural thickening with LEFT chest tubes in place. 2. Stable LEFT LOWER LOBE atelectasis and/or pneumonia and linear atelectasis in the LEFT UPPER LOBE. 3. No new abnormalities. Electronically Signed   By: Evangeline Dakin M.D.   On: 11/06/2018 08:28   Dg Chest Port 1 View  Result Date: 11/05/2018 CLINICAL DATA:  Postop left thoracotomy and pleural decortication. EXAM: PORTABLE CHEST 1 VIEW COMPARISON:  11/04/2018 and 11/02/2018. FINDINGS: 0550 hours. Two left-sided chest tubes remain in place. The volume of residual pleural fluid/thickening on the left is unchanged. There is stable left basilar pulmonary opacity. The right lung is clear. The heart size and mediastinal contours are stable status post median sternotomy and aortic valve replacement. No pneumothorax. IMPRESSION: Stable postoperative chest. Electronically Signed   By: Richardean Sale M.D.   On: 11/05/2018 08:25   Dg Chest Port 1 View  Result Date: 11/04/2018 CLINICAL DATA:  Post VAT S, LEFT lung decortication, drainage of LEFT pleural effusion and Sir shin of LEFT chest tube EXAM: PORTABLE CHEST 1 VIEW COMPARISON:  Portable exam 1100 hours compared to 11/02/2018 FINDINGS: Pair of LEFT thoracostomy tubes now identified. Enlargement of cardiac silhouette post median sternotomy and AVR. Mild interstitial infiltrates question pulmonary edema. Decreased LEFT pleural effusion and atelectasis since previous study. No pneumothorax. Degenerative changes LEFT shoulder. IMPRESSION: Decrease in LEFT pleural effusion and basilar atelectasis post surgery and chest tube placement. Enlargement of cardiac silhouette post AVR with question minimal pulmonary edema. Electronically Signed   By: Lavonia Dana M.D.   On: 11/04/2018 12:51   Results for orders placed or performed during the hospital encounter of 11/04/18  Acid Fast Smear (AFB)     Status: None   Collection Time: 11/04/18  8:41 AM  Result Value Ref Range Status   AFB Specimen  Processing Concentration  Final   Acid Fast Smear Negative  Final    Comment: (NOTE) Performed At: [facility] [address] [place], [place] [id number] Rush Farmer MD UX:[id number]    Source (AFB) PLEURAL  Final    Comment: LEFT Performed at [facility], [place] [address] [place], [place] [address]   Culture, body fluid-bottle     Status: None (Preliminary result)   Collection Time: 11/04/18  8:41 AM  Result Value Ref Range Status   Specimen Description PLEURAL LEFT  Final   Special Requests NONE  Final   Culture   Final    NO GROWTH 2 DAYS Performed at [facility], [place] [address], [place], [place] [address]    Report Status PENDING  Incomplete  Gram stain     Status: None   Collection Time: 11/04/18  8:41 AM  Result Value Ref Range Status   Specimen Description PLEURAL LEFT  Final   Special Requests NONE  Final   Gram Stain   Final    RARE  WBC PRESENT, PREDOMINANTLY MONONUCLEAR NO ORGANISMS SEEN Performed at [place] [address] [place], [place] [address]    Report Status 11/04/2018 FINAL  Final  Aerobic/Anaerobic Culture (surgical/deep wound)     Status: None (Preliminary result)   Collection Time: 11/04/18  9:36 AM  Result Value Ref Range Status   Specimen Description TISSUE LEFT PARIETAL PEEL  Final   Special Requests PATIENT ON FOLLOWING VANC  Final   Gram Stain   Final    RARE WBC PRESENT, PREDOMINANTLY MONONUCLEAR NO ORGANISMS SEEN    Culture   Final    NO GROWTH < 24 HOURS Performed at [facility], [place] [address] [place], [place] [address]    Report Status PENDING  Incomplete  Acid Fast Smear (AFB)     Status: None   Collection Time: 11/04/18  9:36 AM  Result Value Ref Range Status   AFB Specimen Processing Comment  Final    Comment: Tissue Grinding and Digestion/Decontamination   Acid Fast Smear Negative  Final    Comment: (NOTE) Performed At: [facility] [place], [place]  [id number] Rush Farmer MD [id number]    Source (AFB) TISSUE  Final    Comment: LEFT PARIETAL PEEL Performed at [facility], [place] [address] [place], [place] [address]    Assessment/Plan: S/P Procedure(s) (LRB): VIDEO ASSISTED THORACOSCOPY (Left) DRAINAGE OF PLEURAL EFFUSION (Left) Left Lung DECORTICATION (Left) Chest Tube Insertion (Left)  1 remains hypertensive but fairly stable 2 nephrology assisting with management of ESRD/dialysis/HTN issues 3 460 out chest tubes yesterday, cont for another day 4 H/H slightly down further- monitor 5 push pulm toilet and rehab as able  LOS: 2 days    John Giovanni PA-C 11/06/2018 Pager [phone]  400 cc of serosanguineous drainage in the last day-no air leak.  Chest x-ray without pneumothorax.  Will place chest tubes to waterseal.  patient examined and medical record reviewed,agree with above note. Aidenjames Heckmann Trigt III 11/06/2018

## 2018-11-07 ENCOUNTER — Inpatient Hospital Stay (HOSPITAL_COMMUNITY): Payer: Medicare Other

## 2018-11-07 LAB — FERRITIN: Ferritin: 421 ng/mL — ABNORMAL HIGH (ref 24–336)

## 2018-11-07 LAB — IRON AND TIBC
Iron: 10 ug/dL — ABNORMAL LOW (ref 45–182)
Saturation Ratios: 10 % — ABNORMAL LOW (ref 17.9–39.5)
TIBC: 105 ug/dL — ABNORMAL LOW (ref 250–450)
UIBC: 95 ug/dL

## 2018-11-07 LAB — CBC
HEMATOCRIT: 25.8 % — AB (ref 39.0–52.0)
Hemoglobin: 8 g/dL — ABNORMAL LOW (ref 13.0–17.0)
MCH: 29.5 pg (ref 26.0–34.0)
MCHC: 31 g/dL (ref 30.0–36.0)
MCV: 95.2 fL (ref 80.0–100.0)
Platelets: 181 10*3/uL (ref 150–400)
RBC: 2.71 MIL/uL — ABNORMAL LOW (ref 4.22–5.81)
RDW: 16 % — ABNORMAL HIGH (ref 11.5–15.5)
WBC: 10.2 10*3/uL (ref 4.0–10.5)
nRBC: 0 % (ref 0.0–0.2)

## 2018-11-07 LAB — RENAL FUNCTION PANEL
ALBUMIN: 2.7 g/dL — AB (ref 3.5–5.0)
Anion gap: 16 — ABNORMAL HIGH (ref 5–15)
BUN: 51 mg/dL — ABNORMAL HIGH (ref 8–23)
CO2: 23 mmol/L (ref 22–32)
Calcium: 8.2 mg/dL — ABNORMAL LOW (ref 8.9–10.3)
Chloride: 96 mmol/L — ABNORMAL LOW (ref 98–111)
Creatinine, Ser: 8.08 mg/dL — ABNORMAL HIGH (ref 0.61–1.24)
GFR calc non Af Amer: 6 mL/min — ABNORMAL LOW (ref >60)
GFR, EST AFRICAN AMERICAN: 7 mL/min — AB (ref >60)
Glucose, Bld: 95 mg/dL (ref 70–99)
PHOSPHORUS: 7.1 mg/dL — AB (ref 2.5–4.6)
Potassium: 3.9 mmol/L (ref 3.5–5.1)
Sodium: 135 mmol/L (ref 135–145)

## 2018-11-07 MED ORDER — LIDOCAINE HCL (PF) 1 % IJ SOLN: 5.0000 mL | INTRAMUSCULAR | Status: DC | PRN

## 2018-11-07 MED ORDER — LIDOCAINE-PRILOCAINE 2.5-2.5 % EX CREA: 1.0000 "application " | TOPICAL_CREAM | CUTANEOUS | Status: DC | PRN

## 2018-11-07 MED ORDER — HEPARIN SODIUM (PORCINE) 1000 UNIT/ML IJ SOLN
INTRAMUSCULAR | Status: AC
  Administered 2018-11-07: 1000 [IU] via INTRAVENOUS_CENTRAL
  Filled 2018-11-07: qty 1

## 2018-11-07 MED ORDER — DIPHENHYDRAMINE HCL 25 MG PO CAPS
25.0000 mg | ORAL_CAPSULE | Freq: Four times a day (QID) | ORAL | Status: DC | PRN
  Administered 2018-11-07 – 2018-11-09 (×3): 25 mg via ORAL
  Filled 2018-11-07 (×3): qty 1

## 2018-11-07 MED ORDER — DARBEPOETIN ALFA 150 MCG/0.3ML IJ SOSY
PREFILLED_SYRINGE | INTRAMUSCULAR | Status: AC
  Administered 2018-11-07: 150 ug via INTRAVENOUS
  Filled 2018-11-07: qty 0.3

## 2018-11-07 MED ORDER — HEPARIN SODIUM (PORCINE) 1000 UNIT/ML DIALYSIS
1000.0000 [IU] | INTRAMUSCULAR | Status: DC | PRN
  Administered 2018-11-07: 1000 [IU] via INTRAVENOUS_CENTRAL
  Filled 2018-11-07: qty 1

## 2018-11-07 MED ORDER — ALTEPLASE 2 MG IJ SOLR: 2.0000 mg | Freq: Once | INTRAMUSCULAR | Status: DC | PRN

## 2018-11-07 MED ORDER — DIPHENHYDRAMINE HCL 12.5 MG/5ML PO ELIX: 12.5000 mg | ORAL_SOLUTION | Freq: Four times a day (QID) | ORAL | Status: DC | PRN

## 2018-11-07 MED ORDER — SODIUM CHLORIDE 0.9 % IV SOLN: 100.0000 mL | INTRAVENOUS | Status: DC | PRN

## 2018-11-07 MED ORDER — PENTAFLUOROPROP-TETRAFLUOROETH EX AERO: 1.0000 "application " | INHALATION_SPRAY | CUTANEOUS | Status: DC | PRN

## 2018-11-07 MED ORDER — DIPHENHYDRAMINE HCL 50 MG/ML IJ SOLN: 25.0000 mg | Freq: Four times a day (QID) | INTRAMUSCULAR | Status: DC | PRN

## 2018-11-07 NOTE — Discharge Summary (Addendum)
Physician Discharge Summary  Patient ID: Jeffrey [name] MRN: [id number] DOB/AGE: 02/19/1956 63 y.o.  Admit date: 11/04/2018 Discharge date: 11/11/2018  Admission Diagnoses: Recurrent left pleural effusion  Patient Active Problem List   Diagnosis Date Noted  . Physical deconditioning 09/16/2018  . S/P aortic valve replacement with bioprosthetic valve 07/28/2018  . Severe aortic insufficiency 07/28/2018  . Aortic insufficiency 07/25/2018  . Cerebral thrombosis with cerebral infarction 07/13/2018  . Seizure disorder ([place]) 07/10/2018  . Status epilepticus ([employer]) 07/10/2018  . Altered mental status 07/09/2018  . Hypertensive urgency 07/04/2018  . OSA (obstructive sleep apnea) 05/31/2018  . Onychomycosis of right great toe 05/09/2018  . 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 Acute bacterial endocarditis   . Left shoulder pain   . 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 Coagulase negative Staphylococcus bacteremia   . Hypervolemia   . ESRD on hemodialysis ([place])   . Asymptomatic HIV infection ([place])   . CAD (coronary artery disease) 04/19/2018  . Dyspnea 04/10/2018  . Loud snoring 04/08/2018  . Upper GI bleed 04/08/2018  . Malnutrition of moderate degree 02/23/2018  . Seizure ([place]) 02/22/2018  . Mass of duodenum   . Antibiotic long-term use   . Septic arthritis ([place]) 09/15/2017  . Encounter for incision and drainage procedure 09/15/2017  . Effusion of left knee 09/08/2017  . Intertrigo 07/09/2017  . AC joint arthropathy 06/16/2017  . Pseudoaneurysm of arteriovenous dialysis fistula ([place]) 12/23/2016  . Eczema 12/10/2016  . Bilateral low back pain without sciatica   . Aortic valve insufficiency   . Fall 09/08/2016  . Knee abrasion, right, initial encounter 09/08/2016  . Lumbosacral spondylosis without myelopathy 08/03/2016  . Pulmonary nodules 08/03/2016  . Essential hypertension   . Anemia of chronic disease 04/08/2016  . Secondary hyperparathyroidism  ([place]) 02/09/2014  . Asthma 02/09/2014  . Nuclear sclerotic cataract of both eyes 12/12/2013  . Pruritus 10/26/2013  . Myopia with presbyopia of both eyes 10/26/2013  . COPD (chronic obstructive pulmonary disease) ([employer]) 06/27/2013  . Thrombocytopenia ([place]) 01/09/2013  . HBV (hepatitis B virus) infection 01/09/2013  . Anxiety 07/22/2011  . End stage renal disease on home HD 07/10/2011  . HIV disease ([place]) 06/17/2011   Discharge Diagnoses: Recurrent inflammatory left pleural effusion  Patient Active Problem List   Diagnosis Date Noted  . Pleural effusion on left 11/11/2018  . S/P Left VATS with Drainage of Pleural Effusion, Decortication of Visceral/Parietal Pleura 11/04/2018  . Physical deconditioning 09/16/2018  . S/P aortic valve replacement with bioprosthetic valve 07/28/2018  . Severe aortic insufficiency 07/28/2018  . Aortic insufficiency 07/25/2018  . Cerebral thrombosis with cerebral infarction 07/13/2018  . Seizure disorder ([place]) 07/10/2018  . Status epilepticus ([place]) 07/10/2018  . Altered mental status 07/09/2018  . Hypertensive urgency 07/04/2018  . OSA (obstructive sleep apnea) 05/31/2018  . Onychomycosis of right great toe 05/09/2018  . 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 Acute bacterial endocarditis   . Left shoulder pain   . 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 Coagulase negative Staphylococcus bacteremia   . Hypervolemia   . ESRD on hemodialysis ([place])   . Asymptomatic HIV infection ([place])   . CAD (coronary artery disease) 04/19/2018  . Dyspnea 04/10/2018  . Loud snoring 04/08/2018  . Upper GI bleed 04/08/2018  . Malnutrition of moderate degree 02/23/2018  . Seizure ([place]) 02/22/2018  . Mass of duodenum   . Antibiotic long-term use   . Septic arthritis ([place]) 09/15/2017  .  Encounter for incision and drainage procedure 09/15/2017  . Effusion of left knee 09/08/2017  . Intertrigo 07/09/2017  . AC joint arthropathy 06/16/2017   . Pseudoaneurysm of arteriovenous dialysis fistula ([place]) 12/23/2016  . Eczema 12/10/2016  . Bilateral low back pain without sciatica   . Aortic valve insufficiency   . Fall 09/08/2016  . Knee abrasion, right, initial encounter 09/08/2016  . Lumbosacral spondylosis without myelopathy 08/03/2016  . Pulmonary nodules 08/03/2016  . Essential hypertension   . Anemia of chronic disease 04/08/2016  . Secondary hyperparathyroidism ([place]) 02/09/2014  . Asthma 02/09/2014  . Nuclear sclerotic cataract of both eyes 12/12/2013  . Pruritus 10/26/2013  . Myopia with presbyopia of both eyes 10/26/2013  . COPD (chronic obstructive pulmonary disease) ([place]) 06/27/2013  . Thrombocytopenia ([place]) 01/09/2013  . HBV (hepatitis B virus) infection 01/09/2013  . Anxiety 07/22/2011  . End stage renal disease on home HD 07/10/2011  . HIV disease ([place]) 06/17/2011   Discharged Condition: good  History of Present Illness:  Jeffrey [name] is a 63 yo AA male with ESRD on home dialysis, HIV, Hepatitis B, AV Endocarditis S/P AVR performed 11/19, HTN, Hypothyroidism, Seizures, PAD, and Thrombocytopenia.  The patient had a slow post operative course with a slow recovery.  He ultimately ended up being discharged to a SNF placement.  The patient has done pretty well since discharge.  He was found to have a large pleural effusion at his follow up office visit.  Thoracentesis was performed and removed a liter of amber fluid.  The procedure had to be discontinued due to discomfort.  This resulted in only half of the fluid being drained.  Follow up CXR at his most recent office visit showed re-accumulation of fluid.  It was felt the patient should be taken to the operating room and undergo VATS procedure for complete drainage of fluid.  The risks and benefits of the procedure were explained to the patient and he was agreeable to proceed.  Hospital Course:   Jeffrey [name] presented to [facility] on 11/04/2018.  He was taken to the  operating room and underwent Left VATS with drainage of pleural effusion, decortication of parietal and visceral pleural, and intercostal nerve block.  The patient tolerated the procedure without difficulty, was extubated and taken to the recovery room in stable condition.  During his hospital stay he has done well.  Nephrology was consulted immediately post op.  He was started on dialysis and has been monitored very closely.  His chest tubes have not exhibited evidence of air leak and was transitioned to water seal on 11/06/2018.  His chest tube output has slowly decreased.  CXR has shown improvement of left sided pleural effusion.  His chest tubes were removed on 11/08/2018 and 11/09/2018.  Follow up CXR showed no pneumothorax and small residual loculated pleural effusion.   His OR cultures have been negative to date.  Pathology showed Fibrous inflammatory tissue.  Cytology showed Reactive Mesothelial cells, no evidence of malignancy.  He is ambulating independently.  His incisions are healing without difficulty.  He underwent hemodialysis on the morning of discharge and tolerated this well.  He is medically stable for discharge home today.  Consults: nephrology  Significant Diagnostic Studies: radiology:   CXR:  1. Prior median sternotomy and aortic valve replacement. Cardiomegaly with diffuse bilateral pulmonary infiltrates/edema. Findings most consistent CHF. Bilateral pneumonia can not be excluded.  2.  Progressive large left pleural effusion.  Treatments: surgery:   Left video-assisted thoracoscopy, drainage  of pleural effusion, decortication of fibrothorax and intercostal nerve block.  Discharge Exam: Blood pressure (!) 156/100, pulse 72, temperature 98.6 F (37 C), temperature source Oral, resp. rate 17, height 6' (1.829 m), weight 74.3 kg, SpO2 95 %.   General appearance:alert, cooperative and no distress Heart:regular rate and rhythm Lungs:diminished breath  soundsbibasilar Abdomen:soft, non-tender; bowel sounds normal; no masses, no organomegaly Wound:clean and dry  Disposition: Home  Discharge Medications:   Allergies as of 11/11/2018      Reactions   Lisinopril Anaphylaxis, Shortness Of Breath   Throat swelling   Penicillins Anaphylaxis, Other (See Comments)   Childhood allergy Has patient had a PCN reaction causing immediate rash, facial/tongue/throat swelling, SOB or lightheadedness with hypotension: Yes Has patient had a PCN reaction causing severe rash involving mucus membranes or skin necrosis: Unk Has patient had a PCN reaction that required hospitalization: Unk Has patient had a PCN reaction occurring within the last 10 years: No If all of the above answers are "NO", then may proceed with Cephalosporin use.   Fentanyl Other (See Comments)   Lethargy, AMS   Morphine And Related Other (See Comments)   "Not Himself"      Medication List    STOP taking these medications   calcium acetate 667 MG capsule Commonly known as:  PHOSLO   cloNIDine 0.3 MG tablet Commonly known as:  CATAPRES   OXYGEN     TAKE these medications   acetaminophen 325 MG tablet Commonly known as:  TYLENOL Take 2 tablets (650 mg total) by mouth every 6 (six) hours as needed for fever, headache, mild pain or moderate pain. What changed:  Another medication with the same name was removed. Continue taking this medication, and follow the directions you see here.   albuterol (2.5 MG/3ML) 0.083% nebulizer solution Commonly known as:  PROVENTIL Take 3 mLs (2.5 mg total) by nebulization every 6 (six) hours as needed for wheezing or shortness of breath.   amLODipine 10 MG tablet Commonly known as:  NORVASC Take 1 tablet (10 mg total) by mouth daily.   aspirin EC 81 MG tablet Take 81 mg by mouth daily. What changed:  Another medication with the same name was removed. Continue taking this medication, and follow the directions you see here.    bictegravir-emtricitabine-tenofovir AF 50-200-25 MG Tabs tablet Commonly known as:  BIKTARVY Take 1 tablet by mouth daily.   cloNIDine 0.3 mg/24hr patch Commonly known as:  CATAPRES - Dosed in mg/24 hr Place 0.3 mg onto the skin every Saturday.   feeding supplement (PRO-STAT SUGAR FREE 64) Liqd Take 30 mLs by mouth 2 (two) times daily. What changed:  when to take this   ferric gluconate 125 mg in sodium chloride 0.9 % 100 mL Inject 125 mg into the vein every Monday, Wednesday, and Friday with hemodialysis.   hydrALAZINE 100 MG tablet Commonly known as:  APRESOLINE Take 1 tablet (100 mg total) by mouth 3 (three) times daily.   Ipratropium-Albuterol 20-100 MCG/ACT Aers respimat Commonly known as:  COMBIVENT Inhale 2 puffs into the lungs every 6 (six) hours as needed for wheezing (SOB).   isosorbide mononitrate 20 MG tablet Commonly known as:  ISMO,MONOKET Take 1 tablet (20 mg total) by mouth 2 (two) times daily at 10 AM and 5 PM.   Lacosamide 150 MG Tabs Commonly known as:  VIMPAT Take 1 tablet (150 mg total) by mouth 2 (two) times daily.   lactulose 10 GM/15ML solution Commonly known as:  CHRONULAC Take  30 mLs (20 g total) by mouth daily. What changed:    when to take this  reasons to take this   losartan 25 MG tablet Commonly known as:  COZAAR Take 25 mg by mouth daily.   multivitamin Tabs tablet Take 1 tablet by mouth daily.   omeprazole 20 MG capsule Commonly known as:  PRILOSEC Take 1 capsule (20 mg total) by mouth daily.   oxyCODONE 5 MG immediate release tablet Commonly known as:  Oxy IR/ROXICODONE Take 2 tablets (10 mg total) by mouth every 6 (six) hours as needed for up to 2 days for severe pain.   traMADol 50 MG tablet Commonly known as:  ULTRAM Take 1 tablet (50 mg total) by mouth every 6 (six) hours as needed for up to 5 days (mild pain). What changed:    when to take this  reasons to take this   traZODone 50 MG tablet Commonly known as:   DESYREL Take 1 tablet (50 mg total) by mouth at bedtime as needed for sleep.      Follow-up Information    [name], MD Follow up on 11/29/2018.   Specialty:  Cardiothoracic Surgery Why:  Appointment is at 12:00, please get CXR at 11:30 at [facility] located on first floor of our office building Contact information: [address] [place] [address] [phone]           Signed:  Ellwood Handler PA-C Edited by Jerilynn Mages. Roddenberry, PA-C  11/11/2018, 12:09 PM

## 2018-11-07 NOTE — Progress Notes (Signed)
[facility] Progress Note   Subjective:   PCA turned off this am. Pt is drowsy, seen in room.   Objective Vitals:   11/06/18 2313 11/07/18 0340 11/07/18 0757 11/07/18 1203  BP: (!) 152/83 (!) 157/88 (!) 155/85 (!) 141/69  Pulse: 99 100 92 80  Resp: 18 20 (!) 24 17  Temp: 99.3 F (37.4 C) 99.3 F (37.4 C) 98.7 F (37.1 C) 98.7 F (37.1 C)  TempSrc: Oral Oral Oral Oral  SpO2: 91% 99% 96% 92%  Weight:      Height:       Physical Exam Gen: nontoxic, groggy No jvd CV: RRR Lungs: L chest tube with bloody drainage Abd: soft ntnd no ascites or distension Extr: no ankle edema, L RC AVF with t/b+   Dialysis Orders:Dialyzes at home Nx stage, UF </= 2L. L RC AVF Qb 400, 4x/wk, 3:30; Brother cannulates Heparin 5000 u bolus initiation  Assessment/Plan:  # ESRD on Home HD 4x/wk:  HD today, heparin free given surgery.  UF 1.5L as tolerated.  # SP VATS w/ decortication/ L effusion drainage and CT insertion: on 2/14 by TCTS  # Anemia of CKD:  Hb 8.7 this am from 10 2/14. Epogen 14000 TIW at home, resume at 20000 TIW here. Add on iron indices too and replete PRN.   Transfuse PRN.    # MBD ckd:  Per home med list no calcitriol, sensipar.  Watch Ca, phos here.   # HIV: on ARVs.  # HTN: normotensive.  Hydralazine 100 TID, ISMN 20 BID, losartan 25, clonidine patch. CTM.  [facility] 11/07/2018, 12:21 PM  Additional Objective Labs: Basic Metabolic Panel: Recent Labs  Lab 11/02/18 1101 11/04/18 0623 11/05/18 0305 11/06/18 0238  NA 136 136 134* 135  K 3.7 3.1* 3.7 3.8  CL 97*  --  97* 99  CO2 22  --  21* 29  GLUCOSE 113* 110* 116* 117*  BUN 38*  --  41* 25*  CREATININE 7.46*  --  7.70* 5.37*  CALCIUM 9.0  --  8.4* 8.3*   Liver Function Tests: Recent Labs  Lab 11/02/18 1101 11/06/18 0238  AST 50* 14*  ALT 15 9  ALKPHOS 63 61  BILITOT 0.7 0.6  PROT 7.2 6.5  ALBUMIN 3.5 2.8*   No results for input(s): LIPASE, AMYLASE in  the last 168 hours. CBC: Recent Labs  Lab 11/02/18 1101  11/04/18 1748 11/05/18 0305 11/06/18 0238 11/07/18 0242  WBC 6.9  --  12.0* 11.0* 10.4 10.2  HGB 10.2*   < > 10.0* 9.3* 8.7* 8.0*  HCT 31.9*   < > 31.7* 29.8* 28.7* 25.8*  MCV 96.1  --  96.6 93.7 95.7 95.2  PLT 203  --  183 182 185 181   < > = values in this interval not displayed.   Blood Culture    Component Value Date/Time   SDES TISSUE LEFT PARIETAL PEEL 11/04/2018 0936   SPECREQUEST PATIENT ON FOLLOWING VANC 11/04/2018 0936   CULT  11/04/2018 0936    NO GROWTH 2 DAYS NO ANAEROBES ISOLATED; CULTURE IN PROGRESS FOR 5 DAYS Performed at [facility], [place] [address] [place],  [address]    REPTSTATUS PENDING 11/04/2018 8416    Cardiac Enzymes: No results for input(s): CKTOTAL, CKMB, CKMBINDEX, TROPONINI in the last 168 hours. CBG: No results for input(s): GLUCAP in the last 168 hours. Iron Studies:  Recent Labs    11/07/18 0242  IRON 10*  TIBC 105*  FERRITIN 421*   @lablastinr3 @ Studies/Results: Dg Chest Port 1 View  Result Date: 11/07/2018 CLINICAL DATA:  Chest tube EXAM: PORTABLE CHEST 1 VIEW COMPARISON:  11/06/2018 FINDINGS: Left chest tubes are stable. Pleural and parenchymal opacities throughout the left lung are stable. There is no pneumothorax. Subsegmental atelectasis at the right base is stable. Aortic valve replacement hardware is in place. Upper normal heart size. Vascular congestion. IMPRESSION: Stable left chest tubes without pneumothorax. Loculated left pleural fluid persists with opacities in the left lung. Stable vascular congestion. Stable subsegmental atelectasis at the right lung base. Electronically Signed   By: Marybelle Killings M.D.   On: 11/07/2018 07:41   Dg Chest Port 1 View  Result Date: 11/06/2018 CLINICAL DATA:  Postop day 2 LEFT VATS for decortication of empyema. EXAM: PORTABLE CHEST 1 VIEW COMPARISON:  11/05/2018 and earlier. FINDINGS: Two LEFT chest tubes remain in place  with no change in the residual loculated LEFT pleural effusion and/or pleural thickening. Stable airspace consolidation in the LEFT LOWER LOBE and linear atelectasis in the LEFT UPPER LOBE. Pulmonary vascularity normal. No new pulmonary parenchymal abnormalities. Prior sternotomy for aortic valve replacement. Cardiac silhouette moderately enlarged, unchanged. IMPRESSION: 1. Stable residual loculated LEFT pleural effusion and/or pleural thickening with LEFT chest tubes in place. 2. Stable LEFT LOWER LOBE atelectasis and/or pneumonia and linear atelectasis in the LEFT UPPER LOBE. 3. No new abnormalities. Electronically Signed   By: Evangeline Dakin M.D.   On: 11/06/2018 08:28   Medications: . sodium chloride 10 mL/hr at 11/04/18 2000  . ferric gluconate (FERRLECIT/NULECIT) IV    . potassium chloride     . acetaminophen  1,000 mg Oral Q6H   Or  . acetaminophen (TYLENOL) oral liquid 160 mg/5 mL  1,000 mg Oral Q6H  . amLODipine  10 mg Oral Daily  . aspirin EC  81 mg Oral Daily  . bictegravir-emtricitabine-tenofovir AF  1 tablet Oral Daily  . bisacodyl  10 mg Oral Daily  . Chlorhexidine Gluconate Cloth  6 each Topical Q0600  . Chlorhexidine Gluconate Cloth  6 each Topical Q0600  . [START ON 11/12/2018] cloNIDine  0.3 mg Transdermal Q Sat  . Darbepoetin Alfa  150 mcg Intravenous Q Mon-HD  . enoxaparin (LOVENOX) injection  30 mg Subcutaneous Q24H  . feeding supplement (PRO-STAT SUGAR FREE 64)  30 mL Oral Daily  . hydrALAZINE  100 mg Oral TID  . isosorbide mononitrate  20 mg Oral BID  . lacosamide  150 mg Oral BID  . losartan  25 mg Oral Daily  . mouth rinse  15 mL Mouth Rinse BID  . multivitamin  1 tablet Oral QHS  . pantoprazole  40 mg Oral Daily  . senna-docusate  1 tablet Oral QHS

## 2018-11-07 NOTE — Progress Notes (Signed)
      [address]       [place] [address]             [phone]      3 Days Post-Op Procedure(s) (LRB): VIDEO ASSISTED THORACOSCOPY (Left) DRAINAGE OF PLEURAL EFFUSION (Left) Left Lung DECORTICATION (Left) Chest Tube Insertion (Left)   Subjective:  No new complaints.  Pain is well controlled.    Objective: Vital signs in last 24 hours: Temp:  [98.3 F (36.8 C)-99.3 F (37.4 C)] 99.3 F (37.4 C) (02/17 0340) Pulse Rate:  [79-100] 100 (02/17 0340) Cardiac Rhythm: Normal sinus rhythm (02/17 0700) Resp:  [15-27] 20 (02/17 0340) BP: (114-157)/(62-91) 157/88 (02/17 0340) SpO2:  [91 %-99 %] 99 % (02/17 0340)  Intake/Output from previous day: 02/16 0701 - 02/17 0700 In: 580 [P.O.:480; I.V.:100] Out: 110 [Chest Tube:110]  General appearance: alert, cooperative and no distress Heart: regular rate and rhythm and + systolic murmur Lungs: diminished breath sounds bibasilar Abdomen: soft, non-tender; bowel sounds normal; no masses,  no organomegaly Extremities: edema mild Wound: clean and dry  Lab Results: Recent Labs    11/06/18 0238 11/07/18 0242  WBC 10.4 10.2  HGB 8.7* 8.0*  HCT 28.7* 25.8*  PLT 185 181   BMET:  Recent Labs    11/05/18 0305 11/06/18 0238  NA 134* 135  K 3.7 3.8  CL 97* 99  CO2 21* 29  GLUCOSE 116* 117*  BUN 41* 25*  CREATININE 7.70* 5.37*  CALCIUM 8.4* 8.3*    PT/INR: No results for input(s): LABPROT, INR in the last 72 hours. ABG    Component Value Date/Time   PHART 7.349 (L) 11/05/2018 0315   HCO3 24.3 11/05/2018 0315   TCO2 21 (L) 07/29/2018 1609   ACIDBASEDEF 0.6 11/05/2018 0315   O2SAT 95.9 11/05/2018 0315   CBG (last 3)  No results for input(s): GLUCAP in the last 72 hours.  Assessment/Plan: S/P Procedure(s) (LRB): VIDEO ASSISTED THORACOSCOPY (Left) DRAINAGE OF PLEURAL EFFUSION (Left) Left Lung DECORTICATION (Left) Chest Tube Insertion (Left)  1. CV- NSR, + HTN- management per neprhology 2. Chest  tube- output decreasing, level currently at 1390, will monitor if remains low can hopefully remove 1 chest tube tomorrow, no air leak on water seal 3. Pulm- no acute issues, continue IS, wean oxygen as tolerated 4. Renal- ESRD, dialysis, HTN management per Nephrology 5. ID- low grade temp, no leukocytosis, OR cultures remain negative to date, cytology, pathology remain pending 6. Lovenox for DVT prophylaxis 7. Dispo- patient stable, CT output decreasing, no air leak, can hopefully remove 1 chest tube tomorrow, dialysis/HTN per Nephrology, Path/Cytology pending, OR cultures negative to date, repeat CXR in AM   LOS: 3 days    Ellwood Handler 11/07/2018

## 2018-11-08 ENCOUNTER — Inpatient Hospital Stay (HOSPITAL_COMMUNITY): Payer: Medicare Other

## 2018-11-08 LAB — CBC
HCT: 26 % — ABNORMAL LOW (ref 39.0–52.0)
HEMOGLOBIN: 8.1 g/dL — AB (ref 13.0–17.0)
MCH: 29.3 pg (ref 26.0–34.0)
MCHC: 31.2 g/dL (ref 30.0–36.0)
MCV: 94.2 fL (ref 80.0–100.0)
Platelets: 176 10*3/uL (ref 150–400)
RBC: 2.76 MIL/uL — ABNORMAL LOW (ref 4.22–5.81)
RDW: 15.4 % (ref 11.5–15.5)
WBC: 7 10*3/uL (ref 4.0–10.5)
nRBC: 0 % (ref 0.0–0.2)

## 2018-11-08 LAB — BASIC METABOLIC PANEL
Anion gap: 11 (ref 5–15)
BUN: 23 mg/dL (ref 8–23)
CO2: 28 mmol/L (ref 22–32)
Calcium: 8.3 mg/dL — ABNORMAL LOW (ref 8.9–10.3)
Chloride: 96 mmol/L — ABNORMAL LOW (ref 98–111)
Creatinine, Ser: 4.52 mg/dL — ABNORMAL HIGH (ref 0.61–1.24)
GFR calc Af Amer: 15 mL/min — ABNORMAL LOW (ref >60)
GFR, EST NON AFRICAN AMERICAN: 13 mL/min — AB (ref >60)
Glucose, Bld: 87 mg/dL (ref 70–99)
Potassium: 3.8 mmol/L (ref 3.5–5.1)
Sodium: 135 mmol/L (ref 135–145)

## 2018-11-08 MED ORDER — SODIUM CHLORIDE 0.9 % IV SOLN: 510.0000 mg | INTRAVENOUS | Status: DC

## 2018-11-08 MED ORDER — CHLORHEXIDINE GLUCONATE CLOTH 2 % EX PADS
6.0000 | MEDICATED_PAD | Freq: Every day | CUTANEOUS | Status: DC
  Administered 2018-11-08 – 2018-11-11 (×4): 6 via TOPICAL

## 2018-11-08 NOTE — Progress Notes (Addendum)
      [address]       [place] [address]             [phone]      4 Days Post-Op Procedure(s) (LRB): VIDEO ASSISTED THORACOSCOPY (Left) DRAINAGE OF PLEURAL EFFUSION (Left) Left Lung DECORTICATION (Left) Chest Tube Insertion (Left)   Subjective:  No new complaints.  Feeling well.  Itching has improved.  Has not yet moved bowels + passing gas  Objective: Vital signs in last 24 hours: Temp:  [97.7 F (36.5 C)-99.5 F (37.5 C)] 99.5 F (37.5 C) (02/17 2340) Pulse Rate:  [80-98] 98 (02/18 0300) Cardiac Rhythm: Heart block (02/18 0700) Resp:  [15-26] 26 (02/18 0300) BP: (139-166)/(69-94) 165/83 (02/18 0300) SpO2:  [92 %-96 %] 93 % (02/18 0300) Weight:  [74.8 kg-77.3 kg] 74.8 kg (02/17 1649)  Intake/Output from previous day: 02/17 0701 - 02/18 0700 In: 240 [P.O.:240] Out: 1608 [Chest Tube:100]  General appearance: alert, cooperative and no distress Heart: regular rate and rhythm Lungs: diminished breath sounds bibasilar Abdomen: soft, non-tender; bowel sounds normal; no masses,  no organomegaly Wound: clean and dry  Lab Results: Recent Labs    11/07/18 0242 11/08/18 0228  WBC 10.2 7.0  HGB 8.0* 8.1*  HCT 25.8* 26.0*  PLT 181 176   BMET:  Recent Labs    11/07/18 1305 11/08/18 0228  NA 135 135  K 3.9 3.8  CL 96* 96*  CO2 23 28  GLUCOSE 95 87  BUN 51* 23  CREATININE 8.08* 4.52*  CALCIUM 8.2* 8.3*    PT/INR: No results for input(s): LABPROT, INR in the last 72 hours. ABG    Component Value Date/Time   PHART 7.349 (L) 11/05/2018 0315   HCO3 24.3 11/05/2018 0315   TCO2 21 (L) 07/29/2018 1609   ACIDBASEDEF 0.6 11/05/2018 0315   O2SAT 95.9 11/05/2018 0315   CBG (last 3)  No results for input(s): GLUCAP in the last 72 hours.  Assessment/Plan: S/P Procedure(s) (LRB): VIDEO ASSISTED THORACOSCOPY (Left) DRAINAGE OF PLEURAL EFFUSION (Left) Left Lung DECORTICATION (Left) Chest Tube Insertion (Left)  1. CV- NSR, remain  Hypertensive, on home antihypertensive agents 2. Chest tube- on water seal, no air leak present, 80 cc output yesterday (level at 1470)- can likely d/c 1 chest tube today 3. Pulm- no acute issues, continue pulm toilet 4. Renal- ESRD, dialysis and HTN management per nephrology 5. ID- cultures remain negative, cytology/pathology negative for malignant cells 6. Lovenox for DVT 7. Dispo- patient stable, nephrology managing dialysis, HTN, CT output 80 cc yesterday, can likely d/c one chest tube today, contineu current care repeat CXR in AM   LOS: 4 days    Ellwood Handler 11/08/2018 Patient seen and examined, agree with above CXR shows some loculated fluid/ hematoma laterally but overall significantly improved from preop Dc anterior tube today ambulate  Remo Lipps C. Roxan Hockey, MD [facility] [phone]

## 2018-11-08 NOTE — Progress Notes (Signed)
[facility] Progress Note   Subjective: seen in room, stable, no c/o's.  One CT may be removed today.  -1.5 L on HD yest  Objective Vitals:   11/07/18 2340 11/08/18 0300 11/08/18 0908 11/08/18 0910  BP: (!) 157/80 (!) 165/83 (!) 148/83 (!) 148/83  Pulse: 88 98  90  Resp: 15 (!) 26  (!) 24  Temp: 99.5 F (37.5 C)   98.4 F (36.9 C)  TempSrc: Oral Oral  Oral  SpO2: 95% 93%    Weight:      Height:       Physical Exam Gen: nontoxic, groggy No jvd CV: RRR Lungs: L chest tube with bloody drainage Abd: soft ntnd no ascites or distension Extr: no ankle edema, L RC AVF with t/b+   Dialysis Orders: dialyzes at home Nx stage, UF </= 2L. L RC AVF Qb 400, 4x/wk, 3:30; Brother cannulates Heparin 5000 u bolus initiation  Assessment/Plan:  # ESRD on Home HD 4x/wk (MWFSat here):  HD Wednesday upstairs.   # SP VATS w/ decortication/ L effusion drainage and CT insertion: on 2/14 by TCTS  # Anemia of CKD:  Hb 8.0 this am from 10 2/14. Epogen 14000 TIW at home, getting darbe 150 ug / wk here. Tsat low at 10%. IV iron load ordered.   # MBD ckd:  Per home med list no calcitriol, sensipar.  Watch Ca, phos here.   # HIV: on ARVs.  # HTN: normotensive.  Hydralazine 100 TID, ISMN 20 BID, losartan 25, clonidine patch. CTM.  [facility] 11/07/2018, 12:21 PM  Additional Objective Labs: Basic Metabolic Panel: Recent Labs  Lab 11/06/18 0238 11/07/18 1305 11/08/18 0228  NA 135 135 135  K 3.8 3.9 3.8  CL 99 96* 96*  CO2 29 23 28   GLUCOSE 117* 95 87  BUN 25* 51* 23  CREATININE 5.37* 8.08* 4.52*  CALCIUM 8.3* 8.2* 8.3*  PHOS  --  7.1*  --    Liver Function Tests: Recent Labs  Lab 11/02/18 1101 11/06/18 0238 11/07/18 1305  AST 50* 14*  --   ALT 15 9  --   ALKPHOS 63 61  --   BILITOT 0.7 0.6  --   PROT 7.2 6.5  --   ALBUMIN 3.5 2.8* 2.7*   No results for input(s): LIPASE, AMYLASE in the last 168 hours. CBC: Recent Labs  Lab  11/04/18 1748 11/05/18 0305 11/06/18 0238 11/07/18 0242 11/08/18 0228  WBC 12.0* 11.0* 10.4 10.2 7.0  HGB 10.0* 9.3* 8.7* 8.0* 8.1*  HCT 31.7* 29.8* 28.7* 25.8* 26.0*  MCV 96.6 93.7 95.7 95.2 94.2  PLT 183 182 185 181 176   Blood Culture    Component Value Date/Time   SDES TISSUE LEFT PARIETAL PEEL 11/04/2018 0936   SPECREQUEST PATIENT ON FOLLOWING VANC 11/04/2018 0936   CULT  11/04/2018 0936    NO GROWTH 3 DAYS NO ANAEROBES ISOLATED; CULTURE IN PROGRESS FOR 5 DAYS Performed at [facility], [place] [address], [place], [place] [address]    REPTSTATUS PENDING 11/04/2018 7026    Cardiac Enzymes: No results for input(s): CKTOTAL, CKMB, CKMBINDEX, TROPONINI in the last 168 hours. CBG: No results for input(s): GLUCAP in the last 168 hours. Iron Studies:  Recent Labs    11/07/18 0242  IRON 10*  TIBC 105*  FERRITIN 421*   @lablastinr3 @ Studies/Results: Dg Chest Port 1 View  Result Date: 11/08/2018 CLINICAL DATA:  Short of breath. Chest tube in place.  Chest discomfort. EXAM: PORTABLE CHEST 1 VIEW COMPARISON:  11/07/2018 at 5:41 a.m., and older exams. FINDINGS: Two left-sided chest tubes are stable from the earlier exam. There is patchy airspace opacity in the left peripheral mid lung with confluent left lung base opacity, without change. Right lung is hyperexpanded with prominent bronchovascular markings and mild medial lung base streaky opacities, the latter finding consistent with atelectasis. No pneumothorax. Stable changes from prior CT cardiac surgery. IMPRESSION: 1. No significant change from the study obtained earlier this same date. 2. Stable left chest tubes.  No pneumothorax. 3. Loculated left pleural effusion with stable parenchymal airspace lung opacities in the left mid to lower lung. Mild right lung base atelectasis. Electronically Signed   By: Lajean Manes M.D.   On: 11/08/2018 08:30   Dg Chest Port 1 View  Result Date: 11/07/2018 CLINICAL DATA:  Chest tube  EXAM: PORTABLE CHEST 1 VIEW COMPARISON:  11/06/2018 FINDINGS: Left chest tubes are stable. Pleural and parenchymal opacities throughout the left lung are stable. There is no pneumothorax. Subsegmental atelectasis at the right base is stable. Aortic valve replacement hardware is in place. Upper normal heart size. Vascular congestion. IMPRESSION: Stable left chest tubes without pneumothorax. Loculated left pleural fluid persists with opacities in the left lung. Stable vascular congestion. Stable subsegmental atelectasis at the right lung base. Electronically Signed   By: Marybelle Killings M.D.   On: 11/07/2018 07:41   Medications: . sodium chloride 10 mL/hr at 11/04/18 2000  . ferric gluconate (FERRLECIT/NULECIT) IV Stopped (11/07/18 1753)  . potassium chloride     . acetaminophen  1,000 mg Oral Q6H   Or  . acetaminophen (TYLENOL) oral liquid 160 mg/5 mL  1,000 mg Oral Q6H  . amLODipine  10 mg Oral Daily  . aspirin EC  81 mg Oral Daily  . bictegravir-emtricitabine-tenofovir AF  1 tablet Oral Daily  . bisacodyl  10 mg Oral Daily  . Chlorhexidine Gluconate Cloth  6 each Topical Q0600  . Chlorhexidine Gluconate Cloth  6 each Topical Q0600  . [START ON 11/12/2018] cloNIDine  0.3 mg Transdermal Q Sat  . Darbepoetin Alfa  150 mcg Intravenous Q Mon-HD  . enoxaparin (LOVENOX) injection  30 mg Subcutaneous Q24H  . feeding supplement (PRO-STAT SUGAR FREE 64)  30 mL Oral Daily  . hydrALAZINE  100 mg Oral TID  . isosorbide mononitrate  20 mg Oral BID  . lacosamide  150 mg Oral BID  . losartan  25 mg Oral Daily  . mouth rinse  15 mL Mouth Rinse BID  . multivitamin  1 tablet Oral QHS  . pantoprazole  40 mg Oral Daily  . senna-docusate  1 tablet Oral QHS

## 2018-11-08 NOTE — Plan of Care (Signed)
  Problem: Education: Goal: Knowledge of General Education information will improve Description Including pain rating scale, medication(s)/side effects and non-pharmacologic comfort measures Outcome: Progressing   

## 2018-11-08 NOTE — Evaluation (Signed)
Physical Therapy Evaluation Patient Details Name: Jeffrey [name] MRN: [id number] DOB: 08/27/1956 Today's Date: 11/08/2018   History of Present Illness  63 year old gentleman with a complicated medical history including end-stage renal disease on home hemodialysis, aortic valve replacement for recurrent aortic valve endocarditis, hypertension, seizures, HIV, hepatitis B, tubulovillous adenoma of the duodenum, and thrombocytopenia.  He had an aortic valve replacement in November 2019.  Presents now for pleural effusion and s/p VATS with decortications of the lung.  Clinical Impression  Orders received for PT evaluation. Patient demonstrates deficits in functional mobility as indicated below. Will benefit from continued skilled PT to address deficits and maximize function. Will see as indicated and progress as tolerated.  Prior to admission, patient was requiring assist from brother at home and was receiving HHPT services, recommend resumption of these services and care. As patient insistent on avoiding SNF stay.    Follow Up Recommendations Home health PT;Supervision for mobility/OOB    Equipment Recommendations  None recommended by PT    Recommendations for Other Services       Precautions / Restrictions Precautions Precautions: Fall      Mobility  Bed Mobility Overal bed mobility: Needs Assistance Bed Mobility: Supine to Sit     Supine to sit: Min guard     General bed mobility comments: no physical assist, increased time and effort to perform  Transfers Overall transfer level: Needs assistance Equipment used: Rolling walker (2 wheeled) Transfers: Sit to/from Stand Sit to Stand: Min guard         General transfer comment: heavy posterior list with calves resting against bed and heavy reliance on RW for support during power to standing, several moments to center himself over his base of support. declined physical assist  Ambulation/Gait Ambulation/Gait assistance: Min  assist Gait Distance (Feet): 80 Feet Assistive device: Rolling walker (2 wheeled) Gait Pattern/deviations: Step-through pattern;Decreased stride length;Drifts right/left;Trunk flexed Gait velocity: decreased Gait velocity interpretation: <1.31 ft/sec, indicative of household ambulator General Gait Details: heavy reliance on UE support to maintain balance, limited cadence and decreased overall activity tolerance.   Stairs            Wheelchair Mobility    Modified Rankin (Stroke Patients Only)       Balance Overall balance assessment: Needs assistance   Sitting balance-Leahy Scale: Good     Standing balance support: Bilateral upper extremity supported Standing balance-Leahy Scale: Poor Standing balance comment: reliance on external support                             Pertinent Vitals/Pain Pain Assessment: Faces Faces Pain Scale: Hurts little more Pain Location: at site of tube removal Pain Descriptors / Indicators: Sore Pain Intervention(s): Monitored during session    Home Living Family/patient expects to be discharged to:: Private residence Living Arrangements: Other relatives Available Help at Discharge: Family;Available PRN/intermittently Type of Home: House(townhome) Home Access: Stairs to enter   Entrance Stairs-Number of Steps: 3 Home Layout: Two level;Able to live on main level with bedroom/bathroom Home Equipment: Gilford Rile - 2 wheels;Walker - 4 wheels;Cane - quad Additional Comments: Home Hemodialisis/ has a poodle male dog that he loves to care for at home    Prior Function Level of Independence: Needs assistance   Gait / Transfers Assistance Needed: was mobilizing with AD and home health services  ADL's / Homemaking Assistance Needed: home health services  Comments: assist from brother for HD needs and mobility/ADLs and stair  negotation     Hand Dominance   Dominant Hand: Right    Extremity/Trunk Assessment   Upper Extremity  Assessment Upper Extremity Assessment: Defer to OT evaluation    Lower Extremity Assessment Lower Extremity Assessment: Generalized weakness       Communication      Cognition Arousal/Alertness: Awake/alert Behavior During Therapy: Flat affect Overall Cognitive Status: Within Functional Limits for tasks assessed                                        General Comments      Exercises     Assessment/Plan    PT Assessment Patient needs continued PT services  PT Problem List Decreased strength;Decreased activity tolerance;Decreased balance;Decreased mobility;Decreased safety awareness;Cardiopulmonary status limiting activity       PT Treatment Interventions DME instruction;Gait training;Stair training;Functional mobility training;Therapeutic activities;Therapeutic exercise;Balance training;Patient/family education    PT Goals (Current goals can be found in the Care Plan section)  Acute Rehab PT Goals Patient Stated Goal: to go home PT Goal Formulation: With patient Time For Goal Achievement: 11/22/18 Potential to Achieve Goals: Good    Frequency Min 3X/week   Barriers to discharge        Co-evaluation               AM-PAC PT "6 Clicks" Mobility  Outcome Measure Help needed turning from your back to your side while in a flat bed without using bedrails?: A Little Help needed moving from lying on your back to sitting on the side of a flat bed without using bedrails?: A Little Help needed moving to and from a bed to a chair (including a wheelchair)?: A Little Help needed standing up from a chair using your arms (e.g., wheelchair or bedside chair)?: A Little Help needed to walk in hospital room?: A Little Help needed climbing 3-5 steps with a railing? : A Little 6 Click Score: 18    End of Session Equipment Utilized During Treatment: Gait belt Activity Tolerance: Patient tolerated treatment well;Patient limited by fatigue Patient left: in  chair;with call bell/phone within reach Nurse Communication: Mobility status PT Visit Diagnosis: Unsteadiness on feet (R26.81);Difficulty in walking, not elsewhere classified (R26.2)    Time: 1209-1230 PT Time Calculation (min) (ACUTE ONLY): 21 min   Charges:   PT Evaluation $PT Eval Moderate Complexity: 1 Mod          Alben Deeds, PT DPT  Board Certified Neurologic Specialist Acute Rehabilitation Services Pager [phone] Office [place] 11/08/2018, 1:44 PM

## 2018-11-08 NOTE — Discharge Instructions (Signed)
Video-Assisted Thoracic Surgery, Care After °This sheet gives you information about how to care for yourself after your procedure. Your health care provider may also give you more specific instructions. If you have problems or questions, contact your health care provider. °What can I expect after the procedure? °After the procedure, it is common to have: °· Some pain and soreness in your chest. °· Pain when breathing in (inhaling) and coughing. °· Constipation. °· Fatigue. °· Difficulty sleeping. °Follow these instructions at home: °Preventing pneumonia °· Take deep breaths or do breathing exercises as instructed by your health care provider. Doing this helps prevent lung infection (pneumonia). °· Cough frequently. Coughing may cause discomfort, but it is important to clear mucus (phlegm) and expand your lungs. If it hurts to cough, hold a pillow against your chest or place the palms of both hands on top of the incision (use splinting) when you cough. This may help relieve discomfort. °· If you were given an incentive spirometer, use it as directed. An incentive spirometer is a tool that measures how well you are filling your lungs with each breath. °· Participate in pulmonary rehabilitation as directed by your health care provider. This is a program that combines education, exercise, and support from a team of specialists. The goal is to help you heal and get back to your normal activities as soon as possible. °Medicines °· Take over-the-counter or prescription medicines only as told by your health care provider. °· If you have pain, take pain-relieving medicine before your pain becomes severe. This is important because if your pain is under control, you will be able to breathe and cough more comfortably. °· If you were prescribed an antibiotic medicine, take it as told by your health care provider. Do not stop taking the antibiotic even if you start to feel better. °Activity °· Ask your health care provider what  activities are safe for you. °· Avoid activities that use your chest muscles for at least 3-4 weeks. °· Do not lift anything that is heavier than 10 lb (4.5 kg), or the limit that your health care provider tells you, until he or she says that it is safe. °Incision care °· Follow instructions from your health care provider about how to take care of your incision(s). Make sure you: °? Wash your hands with soap and water before you change your bandage (dressing). If soap and water are not available, use hand sanitizer. °? Change your dressing as told by your health care provider. °? Leave stitches (sutures), skin glue, or adhesive strips in place. These skin closures may need to stay in place for 2 weeks or longer. If adhesive strip edges start to loosen and curl up, you may trim the loose edges. Do not remove adhesive strips completely unless your health care provider tells you to do that. °· Keep your dressing dry until it has been removed. °· Check your incision area every day for signs of infection. Check for: °? Redness, swelling, or pain. °? Fluid or blood. °? Warmth. °? Pus or a bad smell. °Bathing °· Do not take baths, swim, or use a hot tub until your health care provider approves. You may take showers. °· After your dressing has been removed, use soap and water to gently wash your incision area. Do not use anything else to clean your incision(s) unless your health care provider tells you to do this. °Driving ° °· Do not drive until your health care provider approves. °· Do not drive or   use heavy machinery while taking prescription pain medicine. °Eating and drinking °· Eat a healthy, balanced diet as instructed by your health care provider. A healthy diet includes plenty of fresh fruits and vegetables, whole grains, and low-fat (lean) proteins. °· Limit foods that are high in fat and processed sugars, such as fried and sweet foods. °· Drink enough fluid to keep your urine clear or pale yellow. °General  instructions ° °· To prevent or treat constipation while you are taking prescription pain medicine, your health care provider may recommend that you: °? Take over-the-counter or prescription medicines. °? Eat foods that are high in fiber, such as beans, fresh fruits and vegetables, and whole grains. °· Do not use any products that contain nicotine or tobacco, such as cigarettes and e-cigarettes. If you need help quitting, ask your health care provider. °· Avoid secondhand smoke. °· Wear compression stockings as told by your health care provider. These stockings help to prevent blood clots and reduce swelling in your legs. °· If you have a chest tube, care for it as instructed by your health care provider. Do not travel by airplane during the 2 weeks after your chest tube is removed, or until your health care provider says that this is safe. °· Keep all follow-up visits as told by your health care provider. This is important. °Contact a health care provider if: °· You have redness, swelling, or pain around an incision. °· You have fluid or blood coming from an incision. °· Your incision area feels warm to the touch. °· You have pus or a bad smell coming from an incision. °· You have a fever or chills. °· You have nausea or vomiting. °· You have pain that does not get better with medicine. °Get help right away if: °· You have chest pain. °· Your heart is fluttering or beating rapidly. °· You develop a rash. °· You have shortness of breath or trouble breathing. °· You are confused. °· You have trouble speaking. °· You feel weak, light-headed, or dizzy. °· You faint. °Summary °· To help prevent lung infection (pneumonia), take deep breaths or do breathing exercises as instructed by your health care provider. °· Cough frequently to clear mucus (phlegm) and expand your lungs. If it hurts to cough, hold a pillow against your chest or place the palms of both hands on top of the incision (use splinting) when you cough. °· If  you have pain, take pain-relieving medicine before your pain becomes severe. This is important because if your pain is under control, you will be able to breathe and cough more comfortably. °· Ask your health care provider what activities are safe for you. °This information is not intended to replace advice given to you by your health care provider. Make sure you discuss any questions you have with your health care provider. °Document Released: 01/02/2013 Document Revised: 08/17/2016 Document Reviewed: 08/17/2016 °Elsevier Interactive Patient Education © 2019 [employer] ° °

## 2018-11-08 NOTE — Progress Notes (Signed)
Anterior chest tube removed earlier today. Dressing to site remains clean, dry, and intact. Only 1 chest tube remains and continue on water seal. Minimal output of 50 ml's serosanguinous. Pain management maintained with medication and repositioning. Encouraged to cough and deep breath. Out of bed to recliner earlier today. Patient ambulated as well with no dyspnea. Call bell within reach. Encouraged to report signs and symptoms to staff.

## 2018-11-08 NOTE — Care Management Important Message (Signed)
Important Message  Patient Details  Name: CAIN FITZHENRY MRN: [id number] Date of Birth: 1955/10/04   Medicare Important Message Given:  Yes    Rory Montel P Lyllie Cobbins 11/08/2018, 11:34 AM

## 2018-11-09 ENCOUNTER — Inpatient Hospital Stay (HOSPITAL_COMMUNITY): Payer: Medicare Other

## 2018-11-09 LAB — CBC
HCT: 26.6 % — ABNORMAL LOW (ref 39.0–52.0)
HEMOGLOBIN: 8.2 g/dL — AB (ref 13.0–17.0)
MCH: 29.1 pg (ref 26.0–34.0)
MCHC: 30.8 g/dL (ref 30.0–36.0)
MCV: 94.3 fL (ref 80.0–100.0)
Platelets: 196 10*3/uL (ref 150–400)
RBC: 2.82 MIL/uL — ABNORMAL LOW (ref 4.22–5.81)
RDW: 15.4 % (ref 11.5–15.5)
WBC: 5.8 10*3/uL (ref 4.0–10.5)
nRBC: 0 % (ref 0.0–0.2)

## 2018-11-09 LAB — AEROBIC/ANAEROBIC CULTURE W GRAM STAIN (SURGICAL/DEEP WOUND): Culture: NO GROWTH

## 2018-11-09 LAB — RENAL FUNCTION PANEL
Albumin: 2.7 g/dL — ABNORMAL LOW (ref 3.5–5.0)
Anion gap: 15 (ref 5–15)
BUN: 39 mg/dL — ABNORMAL HIGH (ref 8–23)
CALCIUM: 8.8 mg/dL — AB (ref 8.9–10.3)
CO2: 24 mmol/L (ref 22–32)
Chloride: 95 mmol/L — ABNORMAL LOW (ref 98–111)
Creatinine, Ser: 6.96 mg/dL — ABNORMAL HIGH (ref 0.61–1.24)
GFR calc Af Amer: 9 mL/min — ABNORMAL LOW (ref >60)
GFR calc non Af Amer: 8 mL/min — ABNORMAL LOW (ref >60)
Glucose, Bld: 91 mg/dL (ref 70–99)
Phosphorus: 3.8 mg/dL (ref 2.5–4.6)
Potassium: 3.9 mmol/L (ref 3.5–5.1)
Sodium: 134 mmol/L — ABNORMAL LOW (ref 135–145)

## 2018-11-09 LAB — CULTURE, BODY FLUID W GRAM STAIN -BOTTLE: Culture: NO GROWTH

## 2018-11-09 NOTE — Progress Notes (Addendum)
      [address]       [place], [phone]             [phone]      5 Days Post-Op Procedure(s) (LRB): VIDEO ASSISTED THORACOSCOPY (Left) DRAINAGE OF PLEURAL EFFUSION (Left) Left Lung DECORTICATION (Left) Chest Tube Insertion (Left)   Subjective:  Patient sleepy this morning.  Undergoing dialysis.  Has some pain as well. + BM  Objective: Vital signs in last 24 hours: Temp:  [97.9 F (36.6 C)-99.2 F (37.3 C)] 98.1 F (36.7 C) (02/19 0800) Pulse Rate:  [72-87] 72 (02/19 0825) Cardiac Rhythm: (P) Normal sinus rhythm;Heart block (02/19 0800) Resp:  [15-22] 17 (02/19 0825) BP: (150-174)/(55-102) 170/102 (02/19 0825) SpO2:  [95 %-100 %] 95 % (02/19 0800)  Intake/Output from previous day: 02/18 0701 - 02/19 0700 In: -  Out: 130 [Chest Tube:130]  General appearance: alert, cooperative and no distress Heart: regular rate and rhythm Lungs: diminished breath sounds bibasilar Abdomen: soft, non-tender; bowel sounds normal; no masses,  no organomegaly Extremities: extremities normal, atraumatic, no cyanosis or edema Wound: clean and dry  Lab Results: Recent Labs    11/08/18 0228 11/09/18 0636  WBC 7.0 5.8  HGB 8.1* 8.2*  HCT 26.0* 26.6*  PLT 176 196   BMET:  Recent Labs    11/08/18 0228 11/09/18 0636  NA 135 134*  K 3.8 3.9  CL 96* 95*  CO2 28 24  GLUCOSE 87 91  BUN 23 39*  CREATININE 4.52* 6.96*  CALCIUM 8.3* 8.8*    PT/INR: No results for input(s): LABPROT, INR in the last 72 hours. ABG    Component Value Date/Time   PHART 7.349 (L) 11/05/2018 0315   HCO3 24.3 11/05/2018 0315   TCO2 21 (L) 07/29/2018 1609   ACIDBASEDEF 0.6 11/05/2018 0315   O2SAT 95.9 11/05/2018 0315   CBG (last 3)  No results for input(s): GLUCAP in the last 72 hours.  Assessment/Plan: S/P Procedure(s) (LRB): VIDEO ASSISTED THORACOSCOPY (Left) DRAINAGE OF PLEURAL EFFUSION (Left) Left Lung DECORTICATION (Left) Chest Tube Insertion (Left)  1. Chest tube-  no air leak, minimal output- possibly remove chest tube today vs tomorrow 2. CV- hemodynamically stable, on Norvasc, Hydralazine, Imdur, Cozaar 3. Pulm- no acute issues, CXR is stable 4. Renal- ESRD, on dialysis currently, per Neprhology 5. ID- OR cultures negative to date, remains afebrile, no leukocytosis 6. Lovenox for DVT 7. Dispo- patient stable, dialysis today, management per Nephrology, CT output remains low, continue current care, repeat CXR in AM   LOS: 5 days    Ellwood Handler 11/09/2018 Patient seen and examined, agree with above Will dc chest tube- output is minimal. There is still a small amount of loculated fluid laterally  Remo Lipps C. Roxan Hockey, MD [facility] [phone]

## 2018-11-09 NOTE — Progress Notes (Signed)
Patient transported to HD per bed/monitor.  Patient assessment completed just prior to transport.  Will await return for further care.

## 2018-11-09 NOTE — Progress Notes (Signed)
[facility] Progress Note   Subjective: seen on HD, states they are pulling the other CT later today  Objective Vitals:   11/09/18 0825 11/09/18 0900 11/09/18 0930 11/09/18 1000  BP: (!) 170/102 (!) 164/93 (!) 163/90 (!) 159/94  Pulse: 72 73 71 74  Resp: 17 18 17 16   Temp:      TempSrc:      SpO2:      Weight:      Height:       Physical Exam Gen: alert, no distress No jvd CV: RRR Lungs: L chest tube with bloody drainage Abd: soft ntnd no ascites or distension Extr: no ankle edema, L RC AVF with t/b+   Dialysis Orders: dialyzes at home Nx stage, UF </= 2L. L RC AVF Qb 400, 4x/wk, 3:30, edw 76kg Brother cannulates Heparin 5000 u bolus initiation  Assessment/Plan:  # ESRD on Home HD 4x/wk (MWFSat here):  HD today. 1.5 L as tol  # SP VATS w/ decortication/ L effusion drainage and CT insertion: on 2/14 by TCTS  # Anemia of CKD:  Hb 8.0 this am from 10 2/14. Epogen 14000 TIW at home, getting darbe 150 ug / wk here. Tsat low at 10%. IV iron load ordered.   # MBD ckd:  Per home med list no calcitriol, sensipar.  Watch Ca, phos here.   # HIV: on ARVs.  # HTN: normotensive. BP's up. Hydralazine 100 TID, ISMN 20 BID, losartan 25, clonidine patch. CTM. Consider lower edw and get more vol off next HD.   [facility] 11/07/2018, 12:21 PM  Additional Objective Labs: Basic Metabolic Panel: Recent Labs  Lab 11/07/18 1305 11/08/18 0228 11/09/18 0636  NA 135 135 134*  K 3.9 3.8 3.9  CL 96* 96* 95*  CO2 23 28 24   GLUCOSE 95 87 91  BUN 51* 23 39*  CREATININE 8.08* 4.52* 6.96*  CALCIUM 8.2* 8.3* 8.8*  PHOS 7.1*  --  3.8   Liver Function Tests: Recent Labs  Lab 11/06/18 0238 11/07/18 1305 11/09/18 0636  AST 14*  --   --   ALT 9  --   --   ALKPHOS 61  --   --   BILITOT 0.6  --   --   PROT 6.5  --   --   ALBUMIN 2.8* 2.7* 2.7*   No results for input(s): LIPASE, AMYLASE in the last 168 hours. CBC: Recent Labs  Lab  11/05/18 0305 11/06/18 0238 11/07/18 0242 11/08/18 0228 11/09/18 0636  WBC 11.0* 10.4 10.2 7.0 5.8  HGB 9.3* 8.7* 8.0* 8.1* 8.2*  HCT 29.8* 28.7* 25.8* 26.0* 26.6*  MCV 93.7 95.7 95.2 94.2 94.3  PLT 182 185 181 176 196   Blood Culture    Component Value Date/Time   SDES TISSUE LEFT PARIETAL PEEL 11/04/2018 0936   SPECREQUEST PATIENT ON FOLLOWING VANC 11/04/2018 0936   CULT  11/04/2018 0936    NO GROWTH 5 DAYS Performed at [facility], [place] [address], [place], [place] [address]    REPTSTATUS PENDING 11/04/2018 8921    Cardiac Enzymes: No results for input(s): CKTOTAL, CKMB, CKMBINDEX, TROPONINI in the last 168 hours. CBG: No results for input(s): GLUCAP in the last 168 hours. Iron Studies:  Recent Labs    11/07/18 0242  IRON 10*  TIBC 105*  FERRITIN 421*   @lablastinr3 @ Studies/Results: Dg Chest Port 1 View  Result Date: 11/09/2018 CLINICAL DATA:  Follow-up lung surgery EXAM: PORTABLE CHEST  1 VIEW COMPARISON:  Yesterday FINDINGS: There is a single remaining left chest tube. No visible pneumothorax. Unchanged volume loss and opacity in the left chest that is both pleural and parenchymal. The right chest is clear. Cardiomegaly with aortic valve replacement. IMPRESSION: 1. Single remaining chest tube with no visible pneumothorax. 2. Stable pleuroparenchymal opacity with volume loss on the left. Electronically Signed   By: Monte Fantasia M.D.   On: 11/09/2018 09:50   Dg Chest Port 1 View  Result Date: 11/08/2018 CLINICAL DATA:  Short of breath. Chest tube in place. Chest discomfort. EXAM: PORTABLE CHEST 1 VIEW COMPARISON:  11/07/2018 at 5:41 a.m., and older exams. FINDINGS: Two left-sided chest tubes are stable from the earlier exam. There is patchy airspace opacity in the left peripheral mid lung with confluent left lung base opacity, without change. Right lung is hyperexpanded with prominent bronchovascular markings and mild medial lung base streaky opacities, the  latter finding consistent with atelectasis. No pneumothorax. Stable changes from prior CT cardiac surgery. IMPRESSION: 1. No significant change from the study obtained earlier this same date. 2. Stable left chest tubes.  No pneumothorax. 3. Loculated left pleural effusion with stable parenchymal airspace lung opacities in the left mid to lower lung. Mild right lung base atelectasis. Electronically Signed   By: Lajean Manes M.D.   On: 11/08/2018 08:30   Medications: . sodium chloride 10 mL/hr at 11/04/18 2000  . ferric gluconate (FERRLECIT/NULECIT) IV 125 mg (11/09/18 1050)  . potassium chloride     . amLODipine  10 mg Oral Daily  . aspirin EC  81 mg Oral Daily  . bictegravir-emtricitabine-tenofovir AF  1 tablet Oral Daily  . bisacodyl  10 mg Oral Daily  . Chlorhexidine Gluconate Cloth  6 each Topical Q0600  . Chlorhexidine Gluconate Cloth  6 each Topical Q0600  . Chlorhexidine Gluconate Cloth  6 each Topical Q0600  . [START ON 11/12/2018] cloNIDine  0.3 mg Transdermal Q Sat  . Darbepoetin Alfa  150 mcg Intravenous Q Mon-HD  . enoxaparin (LOVENOX) injection  30 mg Subcutaneous Q24H  . feeding supplement (PRO-STAT SUGAR FREE 64)  30 mL Oral Daily  . hydrALAZINE  100 mg Oral TID  . isosorbide mononitrate  20 mg Oral BID  . lacosamide  150 mg Oral BID  . losartan  25 mg Oral Daily  . mouth rinse  15 mL Mouth Rinse BID  . multivitamin  1 tablet Oral QHS  . pantoprazole  40 mg Oral Daily  . senna-docusate  1 tablet Oral QHS

## 2018-11-10 ENCOUNTER — Inpatient Hospital Stay (HOSPITAL_COMMUNITY): Payer: Medicare Other

## 2018-11-10 MED ORDER — CHLORHEXIDINE GLUCONATE CLOTH 2 % EX PADS: 6.0000 | MEDICATED_PAD | Freq: Every day | CUTANEOUS | Status: DC

## 2018-11-10 NOTE — Progress Notes (Signed)
PT Cancellation Note  Patient Details Name: Jeffrey [name] MRN: [id number] DOB: [birth year]   Cancelled Treatment:    Reason Eval/Treat Not Completed: Other (comment);Patient declined, no reason specified. Pt known to PT from previous admission. Pt declining physical therapy services, stating, "I will walk later." Did not provide reason for refusal.  Ellamae Sia, PT, DPT Acute Rehabilitation Services Pager [phone] Office [phone]    [name] 11/10/2018, 1:28 PM

## 2018-11-10 NOTE — Progress Notes (Signed)
[facility] Progress Note   Subjective: seen on HD, states they are pulling the other CT later today  Objective Vitals:   11/10/18 0104 11/10/18 0447 11/10/18 0827 11/10/18 1222  BP: (!) 185/88 (!) 167/90 (!) 174/97   Pulse:  80    Resp: 17 17 (!) 23 13  Temp: 98.9 F (37.2 C) 98.8 F (37.1 C) 98.4 F (36.9 C) 98.8 F (37.1 C)  TempSrc: Oral Oral Oral Oral  SpO2:  91% 96% 98%  Weight:      Height:       Physical Exam Gen: alert, no distress No jvd CV: RRR Lungs: L chest tube with bloody drainage Abd: soft ntnd no ascites or distension Extr: no ankle edema, L RC AVF with t/b+   Dialysis Orders: dialyzes at home Nx stage, UF </= 2L. L RC AVF Qb 400, 4x/wk, 3:30, edw 76kg Brother cannulates Heparin 5000 u bolus initiation  Assessment/Plan:  # ESRD on Home HD 4x/wk (MWFSat here):  HD tomorrow, 1st shift.   # SP VATS w/ decortication/ L effusion drainage and CT insertion: on 2/14 by TCTS. Chest tubes, out, possible dc tomorrow (Friday).   # Anemia of CKD:  Hb 8.0 this am from 10 2/14. Epogen 14000 TIW at home, getting darbe 150 ug / wk here. Tsat low at 10%. IV iron load ordered.   # MBD ckd:  Per home med list no calcitriol, sensipar.  Watch Ca, phos here.   # HIV: on ARVs.  # HTN: normotensive. BP's up. Hydralazine 100 TID, ISMN 20 BID, losartan 25, clonidine patch. CTM. Consider lower edw and get more vol off next HD.   [facility] 11/07/2018, 12:21 PM  Additional Objective Labs: Basic Metabolic Panel: Recent Labs  Lab 11/07/18 1305 11/08/18 0228 11/09/18 0636  NA 135 135 134*  K 3.9 3.8 3.9  CL 96* 96* 95*  CO2 23 28 24   GLUCOSE 95 87 91  BUN 51* 23 39*  CREATININE 8.08* 4.52* 6.96*  CALCIUM 8.2* 8.3* 8.8*  PHOS 7.1*  --  3.8   Liver Function Tests: Recent Labs  Lab 11/06/18 0238 11/07/18 1305 11/09/18 0636  AST 14*  --   --   ALT 9  --   --   ALKPHOS 61  --   --   BILITOT 0.6  --   --   PROT 6.5   --   --   ALBUMIN 2.8* 2.7* 2.7*   No results for input(s): LIPASE, AMYLASE in the last 168 hours. CBC: Recent Labs  Lab 11/05/18 0305 11/06/18 0238 11/07/18 0242 11/08/18 0228 11/09/18 0636  WBC 11.0* 10.4 10.2 7.0 5.8  HGB 9.3* 8.7* 8.0* 8.1* 8.2*  HCT 29.8* 28.7* 25.8* 26.0* 26.6*  MCV 93.7 95.7 95.2 94.2 94.3  PLT 182 185 181 176 196   Blood Culture    Component Value Date/Time   SDES TISSUE LEFT PARIETAL PEEL 11/04/2018 0936   SPECREQUEST PATIENT ON FOLLOWING VANC 11/04/2018 0936   CULT  11/04/2018 0936    No growth aerobically or anaerobically. Performed at [facility], [place] [address], [place], [place] [address]    REPTSTATUS 11/09/2018 FINAL 11/04/2018 0936    Cardiac Enzymes: No results for input(s): CKTOTAL, CKMB, CKMBINDEX, TROPONINI in the last 168 hours. CBG: No results for input(s): GLUCAP in the last 168 hours. Iron Studies:  No results for input(s): IRON, TIBC, TRANSFERRIN, FERRITIN in the last 72 hours. @lablastinr3 @ Studies/Results: Dg Chest  2 View  Result Date: 11/10/2018 CLINICAL DATA:  Pleural effusion. EXAM: CHEST - 2 VIEW COMPARISON:  11/09/2018. FINDINGS: Worsening LEFT chest opacity, increasing atelectasis, infiltrate and effusion. RIGHT lung clear. Cardiomegaly. CABG. AVR. No osseous findings. IMPRESSION: Worsening aeration. Worsening LEFT chest opacity, with increasing atelectasis, infiltrate and effusion. Electronically Signed   By: Staci Righter M.D.   On: 11/10/2018 08:50   Dg Chest Port 1 View  Result Date: 11/09/2018 CLINICAL DATA:  Chest tube removal EXAM: PORTABLE CHEST 1 VIEW COMPARISON:  11/09/2018, 11/08/2018, 11/07/2018 FINDINGS: Post sternotomy changes and valve prosthesis. Patchy airspace disease at the right base. Removal of left-sided chest tube. Similar appearance of residual pleural and parenchymal disease in the left mid to lower lung. Small amount of subcutaneous emphysema in the left lower lateral chest wall. No  pneumothorax. Mild cardiomegaly. IMPRESSION: 1. Removal of left-sided chest tube.  No pneumothorax. 2. Similar appearance of pleural and parenchymal disease in the left thorax with mild volume loss. 3. Mild cardiomegaly Electronically Signed   By: Donavan Foil M.D.   On: 11/09/2018 20:17   Dg Chest Port 1 View  Result Date: 11/09/2018 CLINICAL DATA:  Follow-up lung surgery EXAM: PORTABLE CHEST 1 VIEW COMPARISON:  Yesterday FINDINGS: There is a single remaining left chest tube. No visible pneumothorax. Unchanged volume loss and opacity in the left chest that is both pleural and parenchymal. The right chest is clear. Cardiomegaly with aortic valve replacement. IMPRESSION: 1. Single remaining chest tube with no visible pneumothorax. 2. Stable pleuroparenchymal opacity with volume loss on the left. Electronically Signed   By: Monte Fantasia M.D.   On: 11/09/2018 09:50   Medications: . sodium chloride 10 mL/hr at 11/04/18 2000  . ferric gluconate (FERRLECIT/NULECIT) IV Stopped (11/09/18 1150)  . potassium chloride     . amLODipine  10 mg Oral Daily  . aspirin EC  81 mg Oral Daily  . bictegravir-emtricitabine-tenofovir AF  1 tablet Oral Daily  . bisacodyl  10 mg Oral Daily  . Chlorhexidine Gluconate Cloth  6 each Topical Q0600  . Chlorhexidine Gluconate Cloth  6 each Topical Q0600  . Chlorhexidine Gluconate Cloth  6 each Topical Q0600  . [START ON 11/12/2018] cloNIDine  0.3 mg Transdermal Q Sat  . Darbepoetin Alfa  150 mcg Intravenous Q Mon-HD  . enoxaparin (LOVENOX) injection  30 mg Subcutaneous Q24H  . feeding supplement (PRO-STAT SUGAR FREE 64)  30 mL Oral Daily  . hydrALAZINE  100 mg Oral TID  . isosorbide mononitrate  20 mg Oral BID  . lacosamide  150 mg Oral BID  . losartan  25 mg Oral Daily  . mouth rinse  15 mL Mouth Rinse BID  . multivitamin  1 tablet Oral QHS  . pantoprazole  40 mg Oral Daily  . senna-docusate  1 tablet Oral QHS

## 2018-11-10 NOTE — Progress Notes (Addendum)
      [address]       [place] [address]             [phone]      6 Days Post-Op Procedure(s) (LRB): VIDEO ASSISTED THORACOSCOPY (Left) DRAINAGE OF PLEURAL EFFUSION (Left) Left Lung DECORTICATION (Left) Chest Tube Insertion (Left)   Subjective:  No new complaints.  Feels cold this morning.  He states he has arrangements to go home tomorrow.  Objective: Vital signs in last 24 hours: Temp:  [98 F (36.7 C)-99.1 F (37.3 C)] 98.8 F (37.1 C) (02/20 0447) Pulse Rate:  [71-80] 80 (02/20 0447) Cardiac Rhythm: Normal sinus rhythm;Heart block (02/19 2045) Resp:  [14-18] 17 (02/20 0447) BP: (155-185)/(73-102) 167/90 (02/20 0447) SpO2:  [91 %-97 %] 91 % (02/20 0447) Weight:  [74.3 kg] 74.3 kg (02/19 1150)  Intake/Output from previous day: 02/19 0701 - 02/20 0700 In: 600 [P.O.:600] Out: 1500   General appearance: alert, cooperative and no distress Heart: regular rate and rhythm Lungs: diminished breath sounds bibasilar Abdomen: soft, non-tender; bowel sounds normal; no masses,  no organomegaly Extremities: extremities normal, atraumatic, no cyanosis or edema Wound: clean and dry  Lab Results: Recent Labs    11/08/18 0228 11/09/18 0636  WBC 7.0 5.8  HGB 8.1* 8.2*  HCT 26.0* 26.6*  PLT 176 196   BMET:  Recent Labs    11/08/18 0228 11/09/18 0636  NA 135 134*  K 3.8 3.9  CL 96* 95*  CO2 28 24  GLUCOSE 87 91  BUN 23 39*  CREATININE 4.52* 6.96*  CALCIUM 8.3* 8.8*    PT/INR: No results for input(s): LABPROT, INR in the last 72 hours. ABG    Component Value Date/Time   PHART 7.349 (L) 11/05/2018 0315   HCO3 24.3 11/05/2018 0315   TCO2 21 (L) 07/29/2018 1609   ACIDBASEDEF 0.6 11/05/2018 0315   O2SAT 95.9 11/05/2018 0315   CBG (last 3)  No results for input(s): GLUCAP in the last 72 hours.  Assessment/Plan: S/P Procedure(s) (LRB): VIDEO ASSISTED THORACOSCOPY (Left) DRAINAGE OF PLEURAL EFFUSION (Left) Left Lung DECORTICATION  (Left) Chest Tube Insertion (Left)  1.CV- Hemodynamically stable, on home BP medications 2. Pulm- CT removed yesterday, CXR is without pneumothorax, small portion of loculated pleural fluid  3. Renal- ESRD, nephrology following, patient is on home dialysis 4. ID- cultures are negative, no fever, AFB/Fungal also negative to date 5. Dispo- patient stable, CXR free from pneumothorax, small remaining portion of loculated fluid, if remains clinically stable today will d/c home in AM   LOS: 6 days    Ellwood Handler 11/10/2018 Patient seen and examined, agree with above CXR shows some pleural thickening and some loculated fluid laterally but overall greatly improved from preop Plan as above  Remo Lipps C. Roxan Hockey, MD [facility] [phone]

## 2018-11-10 NOTE — Care Management Note (Addendum)
Case Management Note  Patient Details  Name: Jeffrey [name] MRN: [id number] Date of Birth: 1956-09-14  Subjective/Objective:    Pt s/p VATS                Action/Plan:   PTA from home with brother.  Brother is the 24/7 caregiver - brother even manages home HD nightly.  Pt is active with [facility], PT and OT - agency aware of admit.  CM shared quality measures with pt for [facility] and pt wishes to remain with agency   Expected Discharge Date:                  Expected Discharge Plan:  [place]  In-House Referral:     Discharge planning Services  CM Consult  Post Acute Care Choice:    Choice offered to:  Patient  DME Arranged:    DME Agency:     HH Arranged:  RN, PT, OT [facility] Agency:  [facility]  Status of Service:  In process, will continue to follow  If discussed at Long Length of Stay Meetings, dates discussed:    Additional Comments: 11/10/2018

## 2018-11-11 DIAGNOSIS — J90 Pleural effusion, not elsewhere classified: Secondary | ICD-10-CM

## 2018-11-11 LAB — RENAL FUNCTION PANEL
Albumin: 2.6 g/dL — ABNORMAL LOW (ref 3.5–5.0)
Anion gap: 11 (ref 5–15)
BUN: 36 mg/dL — ABNORMAL HIGH (ref 8–23)
CHLORIDE: 95 mmol/L — AB (ref 98–111)
CO2: 28 mmol/L (ref 22–32)
Calcium: 8.9 mg/dL (ref 8.9–10.3)
Creatinine, Ser: 7.06 mg/dL — ABNORMAL HIGH (ref 0.61–1.24)
GFR calc Af Amer: 9 mL/min — ABNORMAL LOW (ref >60)
GFR calc non Af Amer: 7 mL/min — ABNORMAL LOW (ref >60)
Glucose, Bld: 81 mg/dL (ref 70–99)
Phosphorus: 4.8 mg/dL — ABNORMAL HIGH (ref 2.5–4.6)
Potassium: 4.3 mmol/L (ref 3.5–5.1)
Sodium: 134 mmol/L — ABNORMAL LOW (ref 135–145)

## 2018-11-11 LAB — CBC
HCT: 25.7 % — ABNORMAL LOW (ref 39.0–52.0)
HEMOGLOBIN: 7.8 g/dL — AB (ref 13.0–17.0)
MCH: 28.8 pg (ref 26.0–34.0)
MCHC: 30.4 g/dL (ref 30.0–36.0)
MCV: 94.8 fL (ref 80.0–100.0)
Platelets: 200 10*3/uL (ref 150–400)
RBC: 2.71 MIL/uL — ABNORMAL LOW (ref 4.22–5.81)
RDW: 15.6 % — ABNORMAL HIGH (ref 11.5–15.5)
WBC: 6 10*3/uL (ref 4.0–10.5)
nRBC: 0 % (ref 0.0–0.2)

## 2018-11-11 MED ORDER — HEPARIN SODIUM (PORCINE) 1000 UNIT/ML IJ SOLN
INTRAMUSCULAR | Status: AC
  Filled 2018-11-11: qty 2

## 2018-11-11 MED ORDER — TRAMADOL HCL 50 MG PO TABS: 50.0000 mg | ORAL_TABLET | Freq: Four times a day (QID) | ORAL | 0 refills | Status: AC | PRN

## 2018-11-11 MED ORDER — OXYCODONE HCL 5 MG PO TABS: 10.0000 mg | ORAL_TABLET | Freq: Four times a day (QID) | ORAL | 0 refills | Status: AC | PRN

## 2018-11-11 MED ORDER — HEPARIN SODIUM (PORCINE) 1000 UNIT/ML DIALYSIS
1500.0000 [IU] | INTRAMUSCULAR | Status: DC | PRN
  Administered 2018-11-11: 1500 [IU] via INTRAVENOUS_CENTRAL
  Filled 2018-11-11: qty 1.5

## 2018-11-11 NOTE — Plan of Care (Addendum)
  Problem: Education: Goal: Knowledge of General Education information will improve Description Including pain rating scale, medication(s)/side effects and non-pharmacologic comfort measures Outcome: Adequate for Discharge    1425: DC instructions given to patient and family at bedside. Paper Rx given to patient. VSS. PIV DC, hemostasis achieved. Pt c/o pain at left lateral surgical site, tramadol given. Pt states improvement in symptoms. Walked hall with family, 200 feet, tolerated well. Educated on standard VATS restrictions- showering, site care, follow up appointments, medication regimen, encouraged IS and coughing. All belongings sent home with patient.   1542: Delayed DC per pt, requesting to leave at 4pm. Pt has all required DC instructions at this time. CCMD notified of DC. Family at bedside.   Pt escorted by NT via wheelchair to private vehicle driven by family.

## 2018-11-11 NOTE — Progress Notes (Signed)
Physical Therapy Treatment Patient Details Name: Jeffrey [name] MRN: [id number] DOB: Aug 15, 1956 Today's Date: 11/11/2018    History of Present Illness 63 year old gentleman with a complicated medical history including end-stage renal disease on home hemodialysis, aortic valve replacement for recurrent aortic valve endocarditis, hypertension, seizures, HIV, hepatitis B, tubulovillous adenoma of the duodenum, and thrombocytopenia.  He had an aortic valve replacement in November 2019.  Presents now for pleural effusion and s/p VATS with decortications of the lung.    PT Comments    Pt making good progress with mobility. Eager to return home.    Follow Up Recommendations  Home health PT;Supervision for mobility/OOB     Equipment Recommendations  None recommended by PT    Recommendations for Other Services       Precautions / Restrictions Precautions Precautions: Fall    Mobility  Bed Mobility Overal bed mobility: Needs Assistance Bed Mobility: Sit to Supine       Sit to supine: Min assist   General bed mobility comments: Assist to bring feet back up in bed  Transfers Overall transfer level: Needs assistance Equipment used: Rolling walker (2 wheeled);None Transfers: Sit to/from Stand Sit to Stand: Supervision         General transfer comment: supervision for safety  Ambulation/Gait Ambulation/Gait assistance: Supervision Gait Distance (Feet): 200 Feet Assistive device: Rolling walker (2 wheeled) Gait Pattern/deviations: Step-through pattern;Decreased stride length Gait velocity: decreased Gait velocity interpretation: 1.31 - 2.62 ft/sec, indicative of limited community ambulator General Gait Details: Steady gait with walker   Stairs             Wheelchair Mobility    Modified Rankin (Stroke Patients Only)       Balance Overall balance assessment: Needs assistance Sitting-balance support: No upper extremity supported;Feet supported Sitting  balance-Leahy Scale: Good     Standing balance support: No upper extremity supported Standing balance-Leahy Scale: Fair Standing balance comment: Static standing independently. Dynamic activities (reaching, 360 degree turn, side stepping) with min guard                            Cognition Arousal/Alertness: Awake/alert Behavior During Therapy: WFL for tasks assessed/performed Overall Cognitive Status: Within Functional Limits for tasks assessed                                        Exercises Other Exercises Other Exercises: Performed repeated sit to stand x 5 from bed without use of hands.    General Comments        Pertinent Vitals/Pain Pain Assessment: Faces Faces Pain Scale: Hurts little more Pain Location: lt chest Pain Descriptors / Indicators: Sore Pain Intervention(s): Monitored during session    Home Living                      Prior Function            PT Goals (current goals can now be found in the care plan section) Progress towards PT goals: Progressing toward goals    Frequency    Min 3X/week      PT Plan Current plan remains appropriate    Co-evaluation              AM-PAC PT "6 Clicks" Mobility   Outcome Measure  Help needed turning from your back to your side while in  a flat bed without using bedrails?: A Little Help needed moving from lying on your back to sitting on the side of a flat bed without using bedrails?: A Little Help needed moving to and from a bed to a chair (including a wheelchair)?: A Little Help needed standing up from a chair using your arms (e.g., wheelchair or bedside chair)?: A Little Help needed to walk in hospital room?: A Little Help needed climbing 3-5 steps with a railing? : A Little 6 Click Score: 18    End of Session   Activity Tolerance: Patient tolerated treatment well Patient left: with call bell/phone within reach;in bed;with family/visitor present Nurse  Communication: Mobility status PT Visit Diagnosis: Unsteadiness on feet (R26.81);Difficulty in walking, not elsewhere classified (R26.2)     Time: 1191-4782 PT Time Calculation (min) (ACUTE ONLY): 12 min  Charges:  $Gait Training: 8-22 mins                     [place] Pager [phone] Office [place] 11/11/2018, 2:55 PM

## 2018-11-11 NOTE — Procedures (Signed)
Pt stable on HD today, small UF goal of 1.5 L.  No sob or cough. States he is going home later today.  He will resume his home HD regimen at home.    I was present at this dialysis session, have reviewed the session itself and made  appropriate changes Kelly Splinter MD [place] pager [phone]   11/11/2018, 11:33 AM

## 2018-11-11 NOTE — Progress Notes (Addendum)
7 Days Post-Op Procedure(s) (LRB): VIDEO ASSISTED THORACOSCOPY (Left) DRAINAGE OF PLEURAL EFFUSION (Left) Left Lung DECORTICATION (Left) Chest Tube Insertion (Left) Subjective: Just returned to [facility] progressive care after hemodialysis. No new problems or concerns. He said he is ready to go home.  Objective: Vital signs in last 24 hours: Temp:  [98.3 F (36.8 C)-99.5 F (37.5 C)] 98.6 F (37 C) (02/21 1109) Pulse Rate:  [70-84] 72 (02/21 1109) Cardiac Rhythm: Normal sinus rhythm (02/21 0700) Resp:  [13-21] 17 (02/21 1109) BP: (143-192)/(69-95) 153/88 (02/21 1109) SpO2:  [95 %-100 %] 95 % (02/21 1109) Weight:  [74.3 kg-76.1 kg] 74.3 kg (02/21 1109)    Intake/Output from previous day: 02/20 0701 - 02/21 0700 In: 360 [P.O.:360] Out: -  Intake/Output this shift: Total I/O In: 322.7 [IV Piggyback:322.7] Out: 1000 [Other:1000]  General appearance: alert, cooperative and no distress Heart: regular rate and rhythm Lungs: diminished breath sounds bibasilar Abdomen: soft, non-tender; bowel sounds normal; no masses,  no organomegaly Wound: clean and dry   Lab Results: Recent Labs    11/09/18 0636 11/11/18 0749  WBC 5.8 6.0  HGB 8.2* 7.8*  HCT 26.6* 25.7*  PLT 196 200   BMET:  Recent Labs    11/09/18 0636 11/11/18 0749  NA 134* 134*  K 3.9 4.3  CL 95* 95*  CO2 24 28  GLUCOSE 91 81  BUN 39* 36*  CREATININE 6.96* 7.06*  CALCIUM 8.8* 8.9    PT/INR: No results for input(s): LABPROT, INR in the last 72 hours. ABG    Component Value Date/Time   PHART 7.349 (L) 11/05/2018 0315   HCO3 24.3 11/05/2018 0315   TCO2 21 (L) 07/29/2018 1609   ACIDBASEDEF 0.6 11/05/2018 0315   O2SAT 95.9 11/05/2018 0315   CBG (last 3)  No results for input(s): GLUCAP in the last 72 hours.  Assessment/Plan: S/P Procedure(s) (LRB): VIDEO ASSISTED THORACOSCOPY (Left) DRAINAGE OF PLEURAL EFFUSION (Left) Left Lung DECORTICATION (Left) Chest Tube Insertion (Left)  1.CV- Hemodynamically  stable, on home BP medications 2. Pulm- CT removed 11/09/18.  CXR is without pneumothorax, small portion of loculated pleural fluid  3. Renal- ESRD, nephrology following, patient is on home dialysis. Tolerated HD this AM with no complications. 4. ID- cultures are negative, no fever, AFB/Fungal also negative to date 5. Dispo- patient stable, CXR free from pneumothorax, small remaining portion of loculated fluid. Discharge to home today.   LOS: 7 days    Antony Odea, PA-C 11/11/2018 Patient seen and examined, agree with above  Remo Lipps C. Roxan Hockey, MD [facility] [phone]

## 2018-11-14 DIAGNOSIS — Z992 Dependence on renal dialysis: Secondary | ICD-10-CM | POA: Diagnosis not present

## 2018-11-14 DIAGNOSIS — N186 End stage renal disease: Secondary | ICD-10-CM | POA: Diagnosis not present

## 2018-11-14 DIAGNOSIS — D631 Anemia in chronic kidney disease: Secondary | ICD-10-CM | POA: Diagnosis not present

## 2018-11-14 DIAGNOSIS — D509 Iron deficiency anemia, unspecified: Secondary | ICD-10-CM | POA: Diagnosis not present

## 2018-11-14 DIAGNOSIS — N2581 Secondary hyperparathyroidism of renal origin: Secondary | ICD-10-CM | POA: Diagnosis not present

## 2018-11-15 ENCOUNTER — Telehealth: Payer: Self-pay

## 2018-11-15 ENCOUNTER — Other Ambulatory Visit: Payer: Self-pay | Admitting: *Deleted

## 2018-11-15 DIAGNOSIS — Z952 Presence of prosthetic heart valve: Secondary | ICD-10-CM | POA: Diagnosis not present

## 2018-11-15 DIAGNOSIS — I10 Essential (primary) hypertension: Secondary | ICD-10-CM

## 2018-11-15 DIAGNOSIS — I120 Hypertensive chronic kidney disease with stage 5 chronic kidney disease or end stage renal disease: Secondary | ICD-10-CM | POA: Diagnosis not present

## 2018-11-15 DIAGNOSIS — B20 Human immunodeficiency virus [HIV] disease: Secondary | ICD-10-CM | POA: Diagnosis not present

## 2018-11-15 DIAGNOSIS — G4733 Obstructive sleep apnea (adult) (pediatric): Secondary | ICD-10-CM | POA: Diagnosis not present

## 2018-11-15 DIAGNOSIS — Z9181 History of falling: Secondary | ICD-10-CM | POA: Diagnosis not present

## 2018-11-15 DIAGNOSIS — G40909 Epilepsy, unspecified, not intractable, without status epilepticus: Secondary | ICD-10-CM | POA: Diagnosis not present

## 2018-11-15 DIAGNOSIS — D631 Anemia in chronic kidney disease: Secondary | ICD-10-CM | POA: Diagnosis not present

## 2018-11-15 DIAGNOSIS — F418 Other specified anxiety disorders: Secondary | ICD-10-CM | POA: Diagnosis not present

## 2018-11-15 DIAGNOSIS — J449 Chronic obstructive pulmonary disease, unspecified: Secondary | ICD-10-CM | POA: Diagnosis not present

## 2018-11-15 DIAGNOSIS — I351 Nonrheumatic aortic (valve) insufficiency: Secondary | ICD-10-CM

## 2018-11-15 DIAGNOSIS — I340 Nonrheumatic mitral (valve) insufficiency: Secondary | ICD-10-CM | POA: Diagnosis not present

## 2018-11-15 DIAGNOSIS — E440 Moderate protein-calorie malnutrition: Secondary | ICD-10-CM | POA: Diagnosis not present

## 2018-11-15 DIAGNOSIS — M47817 Spondylosis without myelopathy or radiculopathy, lumbosacral region: Secondary | ICD-10-CM | POA: Diagnosis not present

## 2018-11-15 DIAGNOSIS — D696 Thrombocytopenia, unspecified: Secondary | ICD-10-CM | POA: Diagnosis not present

## 2018-11-15 DIAGNOSIS — H2510 Age-related nuclear cataract, unspecified eye: Secondary | ICD-10-CM | POA: Diagnosis not present

## 2018-11-15 DIAGNOSIS — Z8719 Personal history of other diseases of the digestive system: Secondary | ICD-10-CM

## 2018-11-15 DIAGNOSIS — I2510 Atherosclerotic heart disease of native coronary artery without angina pectoris: Secondary | ICD-10-CM | POA: Diagnosis not present

## 2018-11-15 DIAGNOSIS — D509 Iron deficiency anemia, unspecified: Secondary | ICD-10-CM | POA: Diagnosis not present

## 2018-11-15 DIAGNOSIS — N2581 Secondary hyperparathyroidism of renal origin: Secondary | ICD-10-CM | POA: Diagnosis not present

## 2018-11-15 DIAGNOSIS — Z992 Dependence on renal dialysis: Secondary | ICD-10-CM | POA: Diagnosis not present

## 2018-11-15 DIAGNOSIS — N186 End stage renal disease: Secondary | ICD-10-CM | POA: Diagnosis not present

## 2018-11-15 DIAGNOSIS — H5213 Myopia, bilateral: Secondary | ICD-10-CM | POA: Diagnosis not present

## 2018-11-15 MED ORDER — OMEPRAZOLE 20 MG PO CPDR: 20.0000 mg | DELAYED_RELEASE_CAPSULE | Freq: Every day | ORAL | 1 refills | Status: AC

## 2018-11-15 MED ORDER — ISOSORBIDE MONONITRATE 20 MG PO TABS: 20.0000 mg | ORAL_TABLET | Freq: Two times a day (BID) | ORAL | 1 refills | Status: AC

## 2018-11-15 MED ORDER — HYDRALAZINE HCL 100 MG PO TABS: 100.0000 mg | ORAL_TABLET | Freq: Three times a day (TID) | ORAL | 1 refills | Status: AC

## 2018-11-15 MED ORDER — AMLODIPINE BESYLATE 10 MG PO TABS: 10.0000 mg | ORAL_TABLET | Freq: Every day | ORAL | 1 refills | Status: DC

## 2018-11-15 NOTE — Telephone Encounter (Signed)
Requesting to speak with Helen. Please call back.  

## 2018-11-15 NOTE — Telephone Encounter (Signed)
Rtc, request sent

## 2018-11-16 DIAGNOSIS — N186 End stage renal disease: Secondary | ICD-10-CM | POA: Diagnosis not present

## 2018-11-16 DIAGNOSIS — I120 Hypertensive chronic kidney disease with stage 5 chronic kidney disease or end stage renal disease: Secondary | ICD-10-CM | POA: Diagnosis not present

## 2018-11-16 DIAGNOSIS — J449 Chronic obstructive pulmonary disease, unspecified: Secondary | ICD-10-CM | POA: Diagnosis not present

## 2018-11-16 DIAGNOSIS — M47817 Spondylosis without myelopathy or radiculopathy, lumbosacral region: Secondary | ICD-10-CM | POA: Diagnosis not present

## 2018-11-16 DIAGNOSIS — D631 Anemia in chronic kidney disease: Secondary | ICD-10-CM | POA: Diagnosis not present

## 2018-11-16 DIAGNOSIS — I2510 Atherosclerotic heart disease of native coronary artery without angina pectoris: Secondary | ICD-10-CM | POA: Diagnosis not present

## 2018-11-17 DIAGNOSIS — D631 Anemia in chronic kidney disease: Secondary | ICD-10-CM | POA: Diagnosis not present

## 2018-11-17 DIAGNOSIS — N186 End stage renal disease: Secondary | ICD-10-CM | POA: Diagnosis not present

## 2018-11-17 DIAGNOSIS — N2581 Secondary hyperparathyroidism of renal origin: Secondary | ICD-10-CM | POA: Diagnosis not present

## 2018-11-17 DIAGNOSIS — D509 Iron deficiency anemia, unspecified: Secondary | ICD-10-CM | POA: Diagnosis not present

## 2018-11-17 DIAGNOSIS — Z992 Dependence on renal dialysis: Secondary | ICD-10-CM | POA: Diagnosis not present

## 2018-11-18 ENCOUNTER — Ambulatory Visit (INDEPENDENT_AMBULATORY_CARE_PROVIDER_SITE_OTHER): Payer: Medicare Other | Admitting: Infectious Diseases

## 2018-11-18 ENCOUNTER — Other Ambulatory Visit: Payer: Self-pay

## 2018-11-18 ENCOUNTER — Encounter: Payer: Self-pay | Admitting: Infectious Diseases

## 2018-11-18 VITALS — BP 170/85 | HR 72 | Temp 98.0°F | Wt 156.5 lb

## 2018-11-18 DIAGNOSIS — Z23 Encounter for immunization: Secondary | ICD-10-CM

## 2018-11-18 DIAGNOSIS — Z113 Encounter for screening for infections with a predominantly sexual mode of transmission: Secondary | ICD-10-CM

## 2018-11-18 DIAGNOSIS — B181 Chronic viral hepatitis B without delta-agent: Secondary | ICD-10-CM | POA: Diagnosis not present

## 2018-11-18 DIAGNOSIS — Z9889 Other specified postprocedural states: Secondary | ICD-10-CM

## 2018-11-18 DIAGNOSIS — G40909 Epilepsy, unspecified, not intractable, without status epilepticus: Secondary | ICD-10-CM | POA: Diagnosis not present

## 2018-11-18 DIAGNOSIS — Z21 Asymptomatic human immunodeficiency virus [HIV] infection status: Secondary | ICD-10-CM | POA: Diagnosis not present

## 2018-11-18 DIAGNOSIS — I2510 Atherosclerotic heart disease of native coronary artery without angina pectoris: Secondary | ICD-10-CM

## 2018-11-18 DIAGNOSIS — Z79899 Other long term (current) drug therapy: Secondary | ICD-10-CM

## 2018-11-18 DIAGNOSIS — I10 Essential (primary) hypertension: Secondary | ICD-10-CM | POA: Diagnosis not present

## 2018-11-18 NOTE — Progress Notes (Signed)
   Subjective:    Patient ID: Jeffrey [name], male    DOB: 1956-09-02, 63 y.o.   MRN: [id number]  HPI 63 yo M with ESRD on HD at home, HIV, Hepatitis B, AV Endocarditis S/P AVR performed 11/19, HTN, and Thrombocytopenia.  The patient had a slow post operative course with  discharge to a [facility]. He was found to have a large pleural effusion at his follow up. He had thoracentesis with incomplete removal of fluid due to pain.  Follow up CXR, showed re-accumulation of fluid.    He underwent VATS and decortication 2-14. His Cx were (-) and cytology was (-). He was d/c home on 2-18.   "Other than that I've been ok". Has pain from his stitches and is excited to have them removed.  Status he has been eating ok. Breathing is much better.  Getting OT/PT and RN at home.   HIV 1 RNA Quant (copies/mL)  Date Value  05/06/2018 <20  02/09/2018 <20 NOT DETECTED  11/01/2017 <20 DETECTED (A)   CD4 T Cell Abs (/uL)  Date Value  07/14/2018 220 (L)  05/06/2018 160 (L)  11/01/2017 390 (L)   Just took BP rx prior to visit.   Review of Systems  Constitutional: Negative for appetite change and unexpected weight change.  Respiratory: Negative for cough and shortness of breath.   Cardiovascular: Negative for chest pain.  Gastrointestinal: Negative for constipation and diarrhea.  Neurological: Negative for headaches.      Objective:   Physical Exam Constitutional:      Appearance: Normal appearance.  HENT:     Mouth/Throat:     Mouth: Mucous membranes are moist.     Pharynx: No oropharyngeal exudate.  Eyes:     Extraocular Movements: Extraocular movements intact.     Pupils: Pupils are equal, round, and reactive to light.  Neck:     Musculoskeletal: Normal range of motion and neck supple.  Cardiovascular:     Rate and Rhythm: Normal rate and regular rhythm.     Heart sounds: Murmur present.  Pulmonary:     Effort: Pulmonary effort is normal.     Breath sounds: Normal breath sounds.    Abdominal:     General: Bowel sounds are normal. There is no distension.     Palpations: Abdomen is soft.  Musculoskeletal:        General: No swelling.       Arms:     Right lower leg: No edema.     Left lower leg: No edema.  Neurological:     Mental Status: He is alert.       Assessment & Plan:

## 2018-11-18 NOTE — Assessment & Plan Note (Signed)
He is doing better Discussed his ART Needs PCV 13 Will see him back in 7 months.

## 2018-11-18 NOTE — Assessment & Plan Note (Signed)
Markedly HTN today but asx.  He will f/u wth his PCP, has renal appt today. Marland Kitchen

## 2018-11-18 NOTE — Assessment & Plan Note (Signed)
Will check DNA at next visit.

## 2018-11-18 NOTE — Assessment & Plan Note (Signed)
Sx free since October.

## 2018-11-18 NOTE — Addendum Note (Signed)
Addended by: Landis Gandy on: 11/18/2018 11:50 AM   Modules accepted: Orders

## 2018-11-18 NOTE — Assessment & Plan Note (Signed)
Doing well, asx, breathing much better.

## 2018-11-19 DIAGNOSIS — Z992 Dependence on renal dialysis: Secondary | ICD-10-CM | POA: Diagnosis not present

## 2018-11-19 DIAGNOSIS — N186 End stage renal disease: Secondary | ICD-10-CM | POA: Diagnosis not present

## 2018-11-21 DIAGNOSIS — N186 End stage renal disease: Secondary | ICD-10-CM | POA: Diagnosis not present

## 2018-11-21 DIAGNOSIS — N2581 Secondary hyperparathyroidism of renal origin: Secondary | ICD-10-CM | POA: Diagnosis not present

## 2018-11-21 DIAGNOSIS — D631 Anemia in chronic kidney disease: Secondary | ICD-10-CM | POA: Diagnosis not present

## 2018-11-21 DIAGNOSIS — D509 Iron deficiency anemia, unspecified: Secondary | ICD-10-CM | POA: Diagnosis not present

## 2018-11-21 DIAGNOSIS — Z992 Dependence on renal dialysis: Secondary | ICD-10-CM | POA: Diagnosis not present

## 2018-11-22 DIAGNOSIS — I2510 Atherosclerotic heart disease of native coronary artery without angina pectoris: Secondary | ICD-10-CM | POA: Diagnosis not present

## 2018-11-22 DIAGNOSIS — J449 Chronic obstructive pulmonary disease, unspecified: Secondary | ICD-10-CM | POA: Diagnosis not present

## 2018-11-22 DIAGNOSIS — D631 Anemia in chronic kidney disease: Secondary | ICD-10-CM | POA: Diagnosis not present

## 2018-11-22 DIAGNOSIS — M47817 Spondylosis without myelopathy or radiculopathy, lumbosacral region: Secondary | ICD-10-CM | POA: Diagnosis not present

## 2018-11-22 DIAGNOSIS — Z992 Dependence on renal dialysis: Secondary | ICD-10-CM | POA: Diagnosis not present

## 2018-11-22 DIAGNOSIS — D509 Iron deficiency anemia, unspecified: Secondary | ICD-10-CM | POA: Diagnosis not present

## 2018-11-22 DIAGNOSIS — N2581 Secondary hyperparathyroidism of renal origin: Secondary | ICD-10-CM | POA: Diagnosis not present

## 2018-11-22 DIAGNOSIS — I120 Hypertensive chronic kidney disease with stage 5 chronic kidney disease or end stage renal disease: Secondary | ICD-10-CM | POA: Diagnosis not present

## 2018-11-22 DIAGNOSIS — N186 End stage renal disease: Secondary | ICD-10-CM | POA: Diagnosis not present

## 2018-11-23 DIAGNOSIS — I2510 Atherosclerotic heart disease of native coronary artery without angina pectoris: Secondary | ICD-10-CM | POA: Diagnosis not present

## 2018-11-23 DIAGNOSIS — N186 End stage renal disease: Secondary | ICD-10-CM | POA: Diagnosis not present

## 2018-11-23 DIAGNOSIS — J449 Chronic obstructive pulmonary disease, unspecified: Secondary | ICD-10-CM | POA: Diagnosis not present

## 2018-11-23 DIAGNOSIS — D631 Anemia in chronic kidney disease: Secondary | ICD-10-CM | POA: Diagnosis not present

## 2018-11-23 DIAGNOSIS — I120 Hypertensive chronic kidney disease with stage 5 chronic kidney disease or end stage renal disease: Secondary | ICD-10-CM | POA: Diagnosis not present

## 2018-11-23 DIAGNOSIS — M47817 Spondylosis without myelopathy or radiculopathy, lumbosacral region: Secondary | ICD-10-CM | POA: Diagnosis not present

## 2018-11-24 ENCOUNTER — Other Ambulatory Visit: Payer: Self-pay | Admitting: Adult Health

## 2018-11-24 DIAGNOSIS — K5909 Other constipation: Secondary | ICD-10-CM

## 2018-11-24 DIAGNOSIS — I2510 Atherosclerotic heart disease of native coronary artery without angina pectoris: Secondary | ICD-10-CM | POA: Diagnosis not present

## 2018-11-24 DIAGNOSIS — D631 Anemia in chronic kidney disease: Secondary | ICD-10-CM | POA: Diagnosis not present

## 2018-11-24 DIAGNOSIS — N186 End stage renal disease: Secondary | ICD-10-CM | POA: Diagnosis not present

## 2018-11-24 DIAGNOSIS — J449 Chronic obstructive pulmonary disease, unspecified: Secondary | ICD-10-CM | POA: Diagnosis not present

## 2018-11-24 DIAGNOSIS — M47817 Spondylosis without myelopathy or radiculopathy, lumbosacral region: Secondary | ICD-10-CM | POA: Diagnosis not present

## 2018-11-24 DIAGNOSIS — I120 Hypertensive chronic kidney disease with stage 5 chronic kidney disease or end stage renal disease: Secondary | ICD-10-CM | POA: Diagnosis not present

## 2018-11-24 DIAGNOSIS — Z992 Dependence on renal dialysis: Secondary | ICD-10-CM | POA: Diagnosis not present

## 2018-11-24 DIAGNOSIS — N2581 Secondary hyperparathyroidism of renal origin: Secondary | ICD-10-CM | POA: Diagnosis not present

## 2018-11-24 DIAGNOSIS — D509 Iron deficiency anemia, unspecified: Secondary | ICD-10-CM | POA: Diagnosis not present

## 2018-11-25 ENCOUNTER — Other Ambulatory Visit: Payer: Self-pay

## 2018-11-25 DIAGNOSIS — K5909 Other constipation: Secondary | ICD-10-CM

## 2018-11-25 MED ORDER — LACTULOSE 10 GM/15ML PO SOLN: 20.0000 g | Freq: Every day | ORAL | 1 refills | Status: AC | PRN

## 2018-11-25 NOTE — Telephone Encounter (Signed)
lactulose (CHRONULAC) 10 GM/15ML solution, refill request @  [id number] - [place], [place] [phone] (Phone) [phone] (Fax)

## 2018-11-28 ENCOUNTER — Other Ambulatory Visit: Payer: Self-pay | Admitting: Thoracic Surgery (Cardiothoracic Vascular Surgery)

## 2018-11-28 DIAGNOSIS — J449 Chronic obstructive pulmonary disease, unspecified: Secondary | ICD-10-CM | POA: Diagnosis not present

## 2018-11-28 DIAGNOSIS — D509 Iron deficiency anemia, unspecified: Secondary | ICD-10-CM | POA: Diagnosis not present

## 2018-11-28 DIAGNOSIS — D631 Anemia in chronic kidney disease: Secondary | ICD-10-CM | POA: Diagnosis not present

## 2018-11-28 DIAGNOSIS — M47817 Spondylosis without myelopathy or radiculopathy, lumbosacral region: Secondary | ICD-10-CM | POA: Diagnosis not present

## 2018-11-28 DIAGNOSIS — N2581 Secondary hyperparathyroidism of renal origin: Secondary | ICD-10-CM | POA: Diagnosis not present

## 2018-11-28 DIAGNOSIS — I120 Hypertensive chronic kidney disease with stage 5 chronic kidney disease or end stage renal disease: Secondary | ICD-10-CM | POA: Diagnosis not present

## 2018-11-28 DIAGNOSIS — I2510 Atherosclerotic heart disease of native coronary artery without angina pectoris: Secondary | ICD-10-CM | POA: Diagnosis not present

## 2018-11-28 DIAGNOSIS — N186 End stage renal disease: Secondary | ICD-10-CM | POA: Diagnosis not present

## 2018-11-28 DIAGNOSIS — Z992 Dependence on renal dialysis: Secondary | ICD-10-CM | POA: Diagnosis not present

## 2018-11-29 ENCOUNTER — Ambulatory Visit (INDEPENDENT_AMBULATORY_CARE_PROVIDER_SITE_OTHER): Payer: Self-pay | Admitting: Thoracic Surgery (Cardiothoracic Vascular Surgery)

## 2018-11-29 ENCOUNTER — Ambulatory Visit
Admission: RE | Admit: 2018-11-29 | Discharge: 2018-11-29 | Disposition: A | Discharge status: Home / Self Care | Payer: Medicare Other | Source: Ambulatory Visit | Attending: Thoracic Surgery (Cardiothoracic Vascular Surgery) | Admitting: Thoracic Surgery (Cardiothoracic Vascular Surgery)

## 2018-11-29 ENCOUNTER — Other Ambulatory Visit: Payer: Self-pay

## 2018-11-29 ENCOUNTER — Encounter: Payer: Self-pay | Admitting: Thoracic Surgery (Cardiothoracic Vascular Surgery)

## 2018-11-29 VITALS — BP 165/84 | HR 83 | Resp 16 | Ht 72.0 in | Wt 176.6 lb

## 2018-11-29 DIAGNOSIS — N186 End stage renal disease: Secondary | ICD-10-CM | POA: Diagnosis not present

## 2018-11-29 DIAGNOSIS — N2581 Secondary hyperparathyroidism of renal origin: Secondary | ICD-10-CM | POA: Diagnosis not present

## 2018-11-29 DIAGNOSIS — D631 Anemia in chronic kidney disease: Secondary | ICD-10-CM | POA: Diagnosis not present

## 2018-11-29 DIAGNOSIS — D509 Iron deficiency anemia, unspecified: Secondary | ICD-10-CM | POA: Diagnosis not present

## 2018-11-29 DIAGNOSIS — I2510 Atherosclerotic heart disease of native coronary artery without angina pectoris: Secondary | ICD-10-CM

## 2018-11-29 DIAGNOSIS — J90 Pleural effusion, not elsewhere classified: Secondary | ICD-10-CM

## 2018-11-29 DIAGNOSIS — Z953 Presence of xenogenic heart valve: Secondary | ICD-10-CM

## 2018-11-29 DIAGNOSIS — Z992 Dependence on renal dialysis: Secondary | ICD-10-CM | POA: Diagnosis not present

## 2018-11-29 NOTE — Progress Notes (Signed)
[address]       [place] [address]             [phone]     HPI: Jeffrey [name] returns for a scheduled follow-up visit  Jeffrey [name] is a 63 year old man with a history of end-stage renal failure on home dialysis, HIV, hepatitis B, aortic valve endocarditis status post aortic valve replacement, tubulovillous adenoma of the duodenum, hypertension, hypothyroidism, seizures, peripheral arterial disease, and thrombocytopenia.  He initially had staph aortic valve endocarditis in November 2017.  He refused surgery initially.  He did respond to antibiotics.  Unfortunately he presented back with recurrent endocarditis in 2019.  He ended up requiring surgery.  I replaced the valve with a tissue valve.  He developed a left pleural effusion postoperatively.  He had thoracentesis a couple of times but the effusion continue to come back.  I did a left VATS for drainage of the effusion and decortication of the fibrothorax on 11/04/2018.  Postoperatively he did well and went home on day 7.  Pathology showed inflammation.  He feels well.  He says he is scared to lift his left arm because he thinks it might hurt.  His breathing has improved since prior to surgery.  He is working with physical and Occupational Therapy.  He has not had any fevers or chills.  Past Medical History:  Diagnosis Date  . Anemia   . Anxiety   . Aortic valve stenosis   . Arthritis   . Asthma    per pt hx  . Dyspnea   . End stage renal disease on home HD 07/10/2011   Started HD in September 2012 at [facility] with a tunneled HD catheter, now on home HD with NxtStage. Dialyzing through AVF L lower arm with buttonhole technique as of mid 2014. His brother does the HD treatments at home.  They are roommates for 23 years.  The brother works 3rd shift and gets off about 8am and then puts Mr [name] on HD in the morning after getting home. Most of the time he does HD about 4 times a week, for about 4 hours per treatment.  Cause of ESRD was HTN according to patient. He says he let his health go and ending up with complications, and that he didn't like seeing doctors in those days.  He says he was diagnosed with severe HTN when he lived in [place] in his 59's.   . Hepatitis B carrier ([place])   . HIV infection ([place])   . Hypertension   . Hyperthyroidism    normal now  . Pneumonia several yrs ago  . Seizures ([place]) 06/02/2011   had a mild one in Sept. 2019  . Sleep apnea    to start Cpap soon  . Thrombocytopenia ([place])     Current Outpatient Medications  Medication Sig Dispense Refill  . acetaminophen (TYLENOL) 325 MG tablet Take 2 tablets (650 mg total) by mouth every 6 (six) hours as needed for fever, headache, mild pain or moderate pain.    Marland Kitchen albuterol (PROVENTIL) (2.5 MG/3ML) 0.083% nebulizer solution Take 3 mLs (2.5 mg total) by nebulization every 6 (six) hours as needed for wheezing or shortness of breath. 360 mL 0  . Amino Acids-Protein Hydrolys (FEEDING SUPPLEMENT, PRO-STAT SUGAR FREE 64,) LIQD Take 30 mLs by mouth 2 (two) times daily. (Patient taking differently: Take 30 mLs by mouth daily. ) 900 mL 0  . amLODipine (NORVASC) 10 MG tablet Take  1 tablet (10 mg total) by mouth daily. 90 tablet 1  . aspirin EC 81 MG tablet Take 81 mg by mouth daily.    . bictegravir-emtricitabine-tenofovir AF (BIKTARVY) 50-200-25 MG TABS tablet Take 1 tablet by mouth daily. 30 tablet 0  . cloNIDine (CATAPRES - DOSED IN MG/24 HR) 0.3 mg/24hr patch Place 0.3 mg onto the skin every Saturday.    . hydrALAZINE (APRESOLINE) 100 MG tablet Take 1 tablet (100 mg total) by mouth 3 (three) times daily. 90 tablet 1  . isosorbide mononitrate (ISMO,MONOKET) 20 MG tablet Take 1 tablet (20 mg total) by mouth 2 (two) times daily at 10 AM and 5 PM. 180 tablet 1  . Lacosamide (VIMPAT) 150 MG TABS Take 1 tablet (150 mg total) by mouth 2 (two) times daily. 60 tablet 11  . lactulose (CHRONULAC) 10 GM/15ML solution Take 30 mLs (20 g total) by mouth  daily as needed for moderate constipation. 473 mL 1  . losartan (COZAAR) 25 MG tablet Take 25 mg by mouth daily.    . multivitamin (RENA-VIT) TABS tablet Take 1 tablet by mouth daily. 30 tablet 0  . omeprazole (PRILOSEC) 20 MG capsule Take 1 capsule (20 mg total) by mouth daily. 90 capsule 1  . traZODone (DESYREL) 50 MG tablet Take 1 tablet (50 mg total) by mouth at bedtime as needed for sleep. 30 tablet 0  . VIMPAT 100 MG TABS     . ferric gluconate 125 mg in sodium chloride 0.9 % 100 mL Inject 125 mg into the vein every Monday, Wednesday, and Friday with hemodialysis.     No current facility-administered medications for this visit.    Facility-Administered Medications Ordered in Other Visits  Medication Dose Route Frequency Provider Last Rate Last Dose  . etomidate (AMIDATE) injection    Anesthesia Intra-op Myna Bright, CRNA   16 mg at 07/10/18 2031  . rocuronium bromide 10 mg/mL (PF) syringe   Intravenous Anesthesia Intra-op Myna Bright, CRNA   50 mg at 07/10/18 2031    Physical Exam BP (!) 165/84 (BP Location: Right Arm, Patient Position: Sitting, Cuff Size: Large)   Pulse 83   Resp 16   Ht 6' (1.829 m)   Wt 176 lb 9.4 oz (80.1 kg)   SpO2 95% Comment: RA  BMI 23.8 kg/m  63 year old man in no acute distress Alert and oriented x3 with no focal deficits Lungs clear with equal breath sounds bilaterally Cardiac regular rate and rhythm with a faint systolic murmur Incisions well-healed  Diagnostic Tests: CHEST - 2 VIEW  COMPARISON:  11/10/2018  FINDINGS: Unchanged cardiomegaly.  CABG.  Aortic valve replacement.  Pulmonary vascularity is normal. There is residual irregular a pleural thickening in the left midzone and at the left lung base. On the lateral view there is fluid visible in the left base. However, overall aeration at the left lung base appears improved.  The interstitial markings at the right lung base are slightly accentuated, more so than on  the prior study.  IMPRESSION: 1. Small residual left pleural effusion with irregular pleural thickening laterally in the midzone. 2. Slightly improved aeration at the left lung base. 3. Slight increased accentuation of the interstitial markings at the right lung base, nonspecific.   Electronically Signed   By: Lorriane Shire M.D.   On: 11/29/2018 11:58 I personally reviewed the chest x-ray images and concur with the findings noted above there is significant improvement from his pre-operative film.  Impression: Jeffrey [name] is a  63 year old gentleman with a complicated medical history including aortic valve replacement for recurrent aortic valve endocarditis.  He developed a left pleural effusion postoperatively.  He had thoracentesis on a couple of occasions but his fluid rapidly returned.  I did a left VATS for drainage of the effusion and decortication of the fibrothorax just under a month ago.  He did well postoperatively with that.  I am pleased with his result on his chest x-ray today.  There are no restrictions on his activities at this time.  I encouraged him to continue work with PT and OT strengthening and range of motion exercises.  They are aware of the need for stringent sterile technique with his home dialysis given his prosthetic aortic valve.  Plan: Return in 2 months with PA and lateral chest x-ray  [name], MD [facility] [phone]

## 2018-12-01 DIAGNOSIS — D631 Anemia in chronic kidney disease: Secondary | ICD-10-CM | POA: Diagnosis not present

## 2018-12-01 DIAGNOSIS — D509 Iron deficiency anemia, unspecified: Secondary | ICD-10-CM | POA: Diagnosis not present

## 2018-12-01 DIAGNOSIS — Z992 Dependence on renal dialysis: Secondary | ICD-10-CM | POA: Diagnosis not present

## 2018-12-01 DIAGNOSIS — N186 End stage renal disease: Secondary | ICD-10-CM | POA: Diagnosis not present

## 2018-12-01 DIAGNOSIS — N2581 Secondary hyperparathyroidism of renal origin: Secondary | ICD-10-CM | POA: Diagnosis not present

## 2018-12-02 DIAGNOSIS — D509 Iron deficiency anemia, unspecified: Secondary | ICD-10-CM | POA: Diagnosis not present

## 2018-12-02 DIAGNOSIS — M47817 Spondylosis without myelopathy or radiculopathy, lumbosacral region: Secondary | ICD-10-CM | POA: Diagnosis not present

## 2018-12-02 DIAGNOSIS — J449 Chronic obstructive pulmonary disease, unspecified: Secondary | ICD-10-CM | POA: Diagnosis not present

## 2018-12-02 DIAGNOSIS — I120 Hypertensive chronic kidney disease with stage 5 chronic kidney disease or end stage renal disease: Secondary | ICD-10-CM | POA: Diagnosis not present

## 2018-12-02 DIAGNOSIS — N2581 Secondary hyperparathyroidism of renal origin: Secondary | ICD-10-CM | POA: Diagnosis not present

## 2018-12-02 DIAGNOSIS — Z992 Dependence on renal dialysis: Secondary | ICD-10-CM | POA: Diagnosis not present

## 2018-12-02 DIAGNOSIS — N186 End stage renal disease: Secondary | ICD-10-CM | POA: Diagnosis not present

## 2018-12-02 DIAGNOSIS — D631 Anemia in chronic kidney disease: Secondary | ICD-10-CM | POA: Diagnosis not present

## 2018-12-02 DIAGNOSIS — I2510 Atherosclerotic heart disease of native coronary artery without angina pectoris: Secondary | ICD-10-CM | POA: Diagnosis not present

## 2018-12-05 DIAGNOSIS — I120 Hypertensive chronic kidney disease with stage 5 chronic kidney disease or end stage renal disease: Secondary | ICD-10-CM | POA: Diagnosis not present

## 2018-12-05 DIAGNOSIS — J449 Chronic obstructive pulmonary disease, unspecified: Secondary | ICD-10-CM | POA: Diagnosis not present

## 2018-12-05 DIAGNOSIS — N2581 Secondary hyperparathyroidism of renal origin: Secondary | ICD-10-CM | POA: Diagnosis not present

## 2018-12-05 DIAGNOSIS — I2510 Atherosclerotic heart disease of native coronary artery without angina pectoris: Secondary | ICD-10-CM | POA: Diagnosis not present

## 2018-12-05 DIAGNOSIS — D631 Anemia in chronic kidney disease: Secondary | ICD-10-CM | POA: Diagnosis not present

## 2018-12-05 DIAGNOSIS — Z992 Dependence on renal dialysis: Secondary | ICD-10-CM | POA: Diagnosis not present

## 2018-12-05 DIAGNOSIS — D509 Iron deficiency anemia, unspecified: Secondary | ICD-10-CM | POA: Diagnosis not present

## 2018-12-05 DIAGNOSIS — M47817 Spondylosis without myelopathy or radiculopathy, lumbosacral region: Secondary | ICD-10-CM | POA: Diagnosis not present

## 2018-12-05 DIAGNOSIS — N186 End stage renal disease: Secondary | ICD-10-CM | POA: Diagnosis not present

## 2018-12-06 DIAGNOSIS — D509 Iron deficiency anemia, unspecified: Secondary | ICD-10-CM | POA: Diagnosis not present

## 2018-12-06 DIAGNOSIS — Z992 Dependence on renal dialysis: Secondary | ICD-10-CM | POA: Diagnosis not present

## 2018-12-06 DIAGNOSIS — D631 Anemia in chronic kidney disease: Secondary | ICD-10-CM | POA: Diagnosis not present

## 2018-12-06 DIAGNOSIS — N2581 Secondary hyperparathyroidism of renal origin: Secondary | ICD-10-CM | POA: Diagnosis not present

## 2018-12-06 DIAGNOSIS — N186 End stage renal disease: Secondary | ICD-10-CM | POA: Diagnosis not present

## 2018-12-07 ENCOUNTER — Other Ambulatory Visit: Payer: Self-pay | Admitting: Neurology

## 2018-12-07 DIAGNOSIS — G40909 Epilepsy, unspecified, not intractable, without status epilepticus: Secondary | ICD-10-CM

## 2018-12-07 MED ORDER — LACOSAMIDE 150 MG PO TABS: 1.0000 | ORAL_TABLET | Freq: Two times a day (BID) | ORAL | 5 refills | Status: DC

## 2018-12-07 NOTE — Telephone Encounter (Signed)
Pt brother has called for a refill for pt's Lacosamide (VIMPAT) 150 MG TABS [facility] [phone]

## 2018-12-07 NOTE — Addendum Note (Signed)
Addended by: Desmond Lope on: 12/07/2018 09:57 AM   Modules accepted: Orders

## 2018-12-08 DIAGNOSIS — D631 Anemia in chronic kidney disease: Secondary | ICD-10-CM | POA: Diagnosis not present

## 2018-12-08 DIAGNOSIS — Z992 Dependence on renal dialysis: Secondary | ICD-10-CM | POA: Diagnosis not present

## 2018-12-08 DIAGNOSIS — N186 End stage renal disease: Secondary | ICD-10-CM | POA: Diagnosis not present

## 2018-12-08 DIAGNOSIS — N2581 Secondary hyperparathyroidism of renal origin: Secondary | ICD-10-CM | POA: Diagnosis not present

## 2018-12-08 DIAGNOSIS — D509 Iron deficiency anemia, unspecified: Secondary | ICD-10-CM | POA: Diagnosis not present

## 2018-12-11 LAB — FUNGUS CULTURE WITH STAIN

## 2018-12-11 LAB — FUNGUS CULTURE RESULT

## 2018-12-11 LAB — FUNGAL ORGANISM REFLEX

## 2018-12-12 DIAGNOSIS — M47817 Spondylosis without myelopathy or radiculopathy, lumbosacral region: Secondary | ICD-10-CM | POA: Diagnosis not present

## 2018-12-12 DIAGNOSIS — Z992 Dependence on renal dialysis: Secondary | ICD-10-CM | POA: Diagnosis not present

## 2018-12-12 DIAGNOSIS — N2581 Secondary hyperparathyroidism of renal origin: Secondary | ICD-10-CM | POA: Diagnosis not present

## 2018-12-12 DIAGNOSIS — I2510 Atherosclerotic heart disease of native coronary artery without angina pectoris: Secondary | ICD-10-CM | POA: Diagnosis not present

## 2018-12-12 DIAGNOSIS — D631 Anemia in chronic kidney disease: Secondary | ICD-10-CM | POA: Diagnosis not present

## 2018-12-12 DIAGNOSIS — N186 End stage renal disease: Secondary | ICD-10-CM | POA: Diagnosis not present

## 2018-12-12 DIAGNOSIS — J449 Chronic obstructive pulmonary disease, unspecified: Secondary | ICD-10-CM | POA: Diagnosis not present

## 2018-12-12 DIAGNOSIS — I120 Hypertensive chronic kidney disease with stage 5 chronic kidney disease or end stage renal disease: Secondary | ICD-10-CM | POA: Diagnosis not present

## 2018-12-12 DIAGNOSIS — D509 Iron deficiency anemia, unspecified: Secondary | ICD-10-CM | POA: Diagnosis not present

## 2018-12-12 MED FILL — ETHYL CHLORIDE SPRAY: 25 days supply | Qty: 104 | Fill #0 | Status: TO

## 2018-12-13 DIAGNOSIS — Z992 Dependence on renal dialysis: Secondary | ICD-10-CM | POA: Diagnosis not present

## 2018-12-13 DIAGNOSIS — D509 Iron deficiency anemia, unspecified: Secondary | ICD-10-CM | POA: Diagnosis not present

## 2018-12-13 DIAGNOSIS — N186 End stage renal disease: Secondary | ICD-10-CM | POA: Diagnosis not present

## 2018-12-13 DIAGNOSIS — D631 Anemia in chronic kidney disease: Secondary | ICD-10-CM | POA: Diagnosis not present

## 2018-12-13 DIAGNOSIS — N2581 Secondary hyperparathyroidism of renal origin: Secondary | ICD-10-CM | POA: Diagnosis not present

## 2018-12-14 DIAGNOSIS — D631 Anemia in chronic kidney disease: Secondary | ICD-10-CM | POA: Diagnosis not present

## 2018-12-14 DIAGNOSIS — I2510 Atherosclerotic heart disease of native coronary artery without angina pectoris: Secondary | ICD-10-CM | POA: Diagnosis not present

## 2018-12-14 DIAGNOSIS — M47817 Spondylosis without myelopathy or radiculopathy, lumbosacral region: Secondary | ICD-10-CM | POA: Diagnosis not present

## 2018-12-14 DIAGNOSIS — N186 End stage renal disease: Secondary | ICD-10-CM | POA: Diagnosis not present

## 2018-12-14 DIAGNOSIS — J449 Chronic obstructive pulmonary disease, unspecified: Secondary | ICD-10-CM | POA: Diagnosis not present

## 2018-12-14 DIAGNOSIS — I120 Hypertensive chronic kidney disease with stage 5 chronic kidney disease or end stage renal disease: Secondary | ICD-10-CM | POA: Diagnosis not present

## 2018-12-15 DIAGNOSIS — Z992 Dependence on renal dialysis: Secondary | ICD-10-CM | POA: Diagnosis not present

## 2018-12-15 DIAGNOSIS — D696 Thrombocytopenia, unspecified: Secondary | ICD-10-CM | POA: Diagnosis not present

## 2018-12-15 DIAGNOSIS — E440 Moderate protein-calorie malnutrition: Secondary | ICD-10-CM | POA: Diagnosis not present

## 2018-12-15 DIAGNOSIS — H5213 Myopia, bilateral: Secondary | ICD-10-CM | POA: Diagnosis not present

## 2018-12-15 DIAGNOSIS — G40909 Epilepsy, unspecified, not intractable, without status epilepticus: Secondary | ICD-10-CM | POA: Diagnosis not present

## 2018-12-15 DIAGNOSIS — Z9181 History of falling: Secondary | ICD-10-CM | POA: Diagnosis not present

## 2018-12-15 DIAGNOSIS — N186 End stage renal disease: Secondary | ICD-10-CM | POA: Diagnosis not present

## 2018-12-15 DIAGNOSIS — I2510 Atherosclerotic heart disease of native coronary artery without angina pectoris: Secondary | ICD-10-CM | POA: Diagnosis not present

## 2018-12-15 DIAGNOSIS — B20 Human immunodeficiency virus [HIV] disease: Secondary | ICD-10-CM | POA: Diagnosis not present

## 2018-12-15 DIAGNOSIS — G4733 Obstructive sleep apnea (adult) (pediatric): Secondary | ICD-10-CM | POA: Diagnosis not present

## 2018-12-15 DIAGNOSIS — J449 Chronic obstructive pulmonary disease, unspecified: Secondary | ICD-10-CM | POA: Diagnosis not present

## 2018-12-15 DIAGNOSIS — I120 Hypertensive chronic kidney disease with stage 5 chronic kidney disease or end stage renal disease: Secondary | ICD-10-CM | POA: Diagnosis not present

## 2018-12-15 DIAGNOSIS — D509 Iron deficiency anemia, unspecified: Secondary | ICD-10-CM | POA: Diagnosis not present

## 2018-12-15 DIAGNOSIS — I340 Nonrheumatic mitral (valve) insufficiency: Secondary | ICD-10-CM | POA: Diagnosis not present

## 2018-12-15 DIAGNOSIS — D631 Anemia in chronic kidney disease: Secondary | ICD-10-CM | POA: Diagnosis not present

## 2018-12-15 DIAGNOSIS — Z952 Presence of prosthetic heart valve: Secondary | ICD-10-CM | POA: Diagnosis not present

## 2018-12-15 DIAGNOSIS — M47817 Spondylosis without myelopathy or radiculopathy, lumbosacral region: Secondary | ICD-10-CM | POA: Diagnosis not present

## 2018-12-15 DIAGNOSIS — H2510 Age-related nuclear cataract, unspecified eye: Secondary | ICD-10-CM | POA: Diagnosis not present

## 2018-12-15 DIAGNOSIS — F418 Other specified anxiety disorders: Secondary | ICD-10-CM | POA: Diagnosis not present

## 2018-12-15 DIAGNOSIS — N2581 Secondary hyperparathyroidism of renal origin: Secondary | ICD-10-CM | POA: Diagnosis not present

## 2018-12-16 DIAGNOSIS — J449 Chronic obstructive pulmonary disease, unspecified: Secondary | ICD-10-CM | POA: Diagnosis not present

## 2018-12-16 DIAGNOSIS — M47817 Spondylosis without myelopathy or radiculopathy, lumbosacral region: Secondary | ICD-10-CM | POA: Diagnosis not present

## 2018-12-16 DIAGNOSIS — D631 Anemia in chronic kidney disease: Secondary | ICD-10-CM | POA: Diagnosis not present

## 2018-12-16 DIAGNOSIS — I2510 Atherosclerotic heart disease of native coronary artery without angina pectoris: Secondary | ICD-10-CM | POA: Diagnosis not present

## 2018-12-16 DIAGNOSIS — I120 Hypertensive chronic kidney disease with stage 5 chronic kidney disease or end stage renal disease: Secondary | ICD-10-CM | POA: Diagnosis not present

## 2018-12-16 DIAGNOSIS — N186 End stage renal disease: Secondary | ICD-10-CM | POA: Diagnosis not present

## 2018-12-18 LAB — ACID FAST CULTURE WITH REFLEXED SENSITIVITIES (MYCOBACTERIA)
Acid Fast Culture: NEGATIVE
Acid Fast Culture: NEGATIVE

## 2018-12-19 DIAGNOSIS — N2581 Secondary hyperparathyroidism of renal origin: Secondary | ICD-10-CM | POA: Diagnosis not present

## 2018-12-19 DIAGNOSIS — N186 End stage renal disease: Secondary | ICD-10-CM | POA: Diagnosis not present

## 2018-12-19 DIAGNOSIS — D509 Iron deficiency anemia, unspecified: Secondary | ICD-10-CM | POA: Diagnosis not present

## 2018-12-19 DIAGNOSIS — Z992 Dependence on renal dialysis: Secondary | ICD-10-CM | POA: Diagnosis not present

## 2018-12-19 DIAGNOSIS — D631 Anemia in chronic kidney disease: Secondary | ICD-10-CM | POA: Diagnosis not present

## 2018-12-20 DIAGNOSIS — D509 Iron deficiency anemia, unspecified: Secondary | ICD-10-CM | POA: Diagnosis not present

## 2018-12-20 DIAGNOSIS — D631 Anemia in chronic kidney disease: Secondary | ICD-10-CM | POA: Diagnosis not present

## 2018-12-20 DIAGNOSIS — N2581 Secondary hyperparathyroidism of renal origin: Secondary | ICD-10-CM | POA: Diagnosis not present

## 2018-12-20 DIAGNOSIS — Z992 Dependence on renal dialysis: Secondary | ICD-10-CM | POA: Diagnosis not present

## 2018-12-20 DIAGNOSIS — N186 End stage renal disease: Secondary | ICD-10-CM | POA: Diagnosis not present

## 2018-12-21 DIAGNOSIS — I120 Hypertensive chronic kidney disease with stage 5 chronic kidney disease or end stage renal disease: Secondary | ICD-10-CM | POA: Diagnosis not present

## 2018-12-21 DIAGNOSIS — N186 End stage renal disease: Secondary | ICD-10-CM | POA: Diagnosis not present

## 2018-12-21 DIAGNOSIS — I2510 Atherosclerotic heart disease of native coronary artery without angina pectoris: Secondary | ICD-10-CM | POA: Diagnosis not present

## 2018-12-21 DIAGNOSIS — M47817 Spondylosis without myelopathy or radiculopathy, lumbosacral region: Secondary | ICD-10-CM | POA: Diagnosis not present

## 2018-12-21 DIAGNOSIS — D631 Anemia in chronic kidney disease: Secondary | ICD-10-CM | POA: Diagnosis not present

## 2018-12-21 DIAGNOSIS — J449 Chronic obstructive pulmonary disease, unspecified: Secondary | ICD-10-CM | POA: Diagnosis not present

## 2018-12-22 DIAGNOSIS — D631 Anemia in chronic kidney disease: Secondary | ICD-10-CM | POA: Diagnosis not present

## 2018-12-22 DIAGNOSIS — N186 End stage renal disease: Secondary | ICD-10-CM | POA: Diagnosis not present

## 2018-12-22 DIAGNOSIS — D509 Iron deficiency anemia, unspecified: Secondary | ICD-10-CM | POA: Diagnosis not present

## 2018-12-22 DIAGNOSIS — I120 Hypertensive chronic kidney disease with stage 5 chronic kidney disease or end stage renal disease: Secondary | ICD-10-CM | POA: Diagnosis not present

## 2018-12-22 DIAGNOSIS — J449 Chronic obstructive pulmonary disease, unspecified: Secondary | ICD-10-CM | POA: Diagnosis not present

## 2018-12-22 DIAGNOSIS — I2510 Atherosclerotic heart disease of native coronary artery without angina pectoris: Secondary | ICD-10-CM | POA: Diagnosis not present

## 2018-12-22 DIAGNOSIS — M47817 Spondylosis without myelopathy or radiculopathy, lumbosacral region: Secondary | ICD-10-CM | POA: Diagnosis not present

## 2018-12-22 DIAGNOSIS — Z992 Dependence on renal dialysis: Secondary | ICD-10-CM | POA: Diagnosis not present

## 2018-12-22 DIAGNOSIS — N2581 Secondary hyperparathyroidism of renal origin: Secondary | ICD-10-CM | POA: Diagnosis not present

## 2018-12-23 DIAGNOSIS — I120 Hypertensive chronic kidney disease with stage 5 chronic kidney disease or end stage renal disease: Secondary | ICD-10-CM | POA: Diagnosis not present

## 2018-12-23 DIAGNOSIS — I2510 Atherosclerotic heart disease of native coronary artery without angina pectoris: Secondary | ICD-10-CM | POA: Diagnosis not present

## 2018-12-23 DIAGNOSIS — D631 Anemia in chronic kidney disease: Secondary | ICD-10-CM | POA: Diagnosis not present

## 2018-12-23 DIAGNOSIS — D509 Iron deficiency anemia, unspecified: Secondary | ICD-10-CM | POA: Diagnosis not present

## 2018-12-23 DIAGNOSIS — N186 End stage renal disease: Secondary | ICD-10-CM | POA: Diagnosis not present

## 2018-12-23 DIAGNOSIS — N2581 Secondary hyperparathyroidism of renal origin: Secondary | ICD-10-CM | POA: Diagnosis not present

## 2018-12-23 DIAGNOSIS — J449 Chronic obstructive pulmonary disease, unspecified: Secondary | ICD-10-CM | POA: Diagnosis not present

## 2018-12-23 DIAGNOSIS — M47817 Spondylosis without myelopathy or radiculopathy, lumbosacral region: Secondary | ICD-10-CM | POA: Diagnosis not present

## 2018-12-23 DIAGNOSIS — Z992 Dependence on renal dialysis: Secondary | ICD-10-CM | POA: Diagnosis not present

## 2018-12-26 DIAGNOSIS — J449 Chronic obstructive pulmonary disease, unspecified: Secondary | ICD-10-CM | POA: Diagnosis not present

## 2018-12-26 DIAGNOSIS — N186 End stage renal disease: Secondary | ICD-10-CM | POA: Diagnosis not present

## 2018-12-26 DIAGNOSIS — M47817 Spondylosis without myelopathy or radiculopathy, lumbosacral region: Secondary | ICD-10-CM | POA: Diagnosis not present

## 2018-12-26 DIAGNOSIS — I2510 Atherosclerotic heart disease of native coronary artery without angina pectoris: Secondary | ICD-10-CM | POA: Diagnosis not present

## 2018-12-26 DIAGNOSIS — N2581 Secondary hyperparathyroidism of renal origin: Secondary | ICD-10-CM | POA: Diagnosis not present

## 2018-12-26 DIAGNOSIS — D631 Anemia in chronic kidney disease: Secondary | ICD-10-CM | POA: Diagnosis not present

## 2018-12-26 DIAGNOSIS — D509 Iron deficiency anemia, unspecified: Secondary | ICD-10-CM | POA: Diagnosis not present

## 2018-12-26 DIAGNOSIS — I120 Hypertensive chronic kidney disease with stage 5 chronic kidney disease or end stage renal disease: Secondary | ICD-10-CM | POA: Diagnosis not present

## 2018-12-26 DIAGNOSIS — Z992 Dependence on renal dialysis: Secondary | ICD-10-CM | POA: Diagnosis not present

## 2018-12-27 ENCOUNTER — Other Ambulatory Visit: Payer: Self-pay | Admitting: Infectious Diseases

## 2018-12-27 ENCOUNTER — Encounter: Payer: Self-pay | Admitting: *Deleted

## 2018-12-27 DIAGNOSIS — N186 End stage renal disease: Secondary | ICD-10-CM | POA: Diagnosis not present

## 2018-12-27 DIAGNOSIS — N2581 Secondary hyperparathyroidism of renal origin: Secondary | ICD-10-CM | POA: Diagnosis not present

## 2018-12-27 DIAGNOSIS — D509 Iron deficiency anemia, unspecified: Secondary | ICD-10-CM | POA: Diagnosis not present

## 2018-12-27 DIAGNOSIS — D631 Anemia in chronic kidney disease: Secondary | ICD-10-CM | POA: Diagnosis not present

## 2018-12-27 DIAGNOSIS — Z992 Dependence on renal dialysis: Secondary | ICD-10-CM | POA: Diagnosis not present

## 2018-12-27 DIAGNOSIS — B20 Human immunodeficiency virus [HIV] disease: Secondary | ICD-10-CM

## 2018-12-29 DIAGNOSIS — Z992 Dependence on renal dialysis: Secondary | ICD-10-CM | POA: Diagnosis not present

## 2018-12-29 DIAGNOSIS — D509 Iron deficiency anemia, unspecified: Secondary | ICD-10-CM | POA: Diagnosis not present

## 2018-12-29 DIAGNOSIS — N2581 Secondary hyperparathyroidism of renal origin: Secondary | ICD-10-CM | POA: Diagnosis not present

## 2018-12-29 DIAGNOSIS — N186 End stage renal disease: Secondary | ICD-10-CM | POA: Diagnosis not present

## 2018-12-29 DIAGNOSIS — D631 Anemia in chronic kidney disease: Secondary | ICD-10-CM | POA: Diagnosis not present

## 2018-12-30 DIAGNOSIS — Z992 Dependence on renal dialysis: Secondary | ICD-10-CM | POA: Diagnosis not present

## 2018-12-30 DIAGNOSIS — N186 End stage renal disease: Secondary | ICD-10-CM | POA: Diagnosis not present

## 2018-12-30 DIAGNOSIS — D509 Iron deficiency anemia, unspecified: Secondary | ICD-10-CM | POA: Diagnosis not present

## 2018-12-30 DIAGNOSIS — D631 Anemia in chronic kidney disease: Secondary | ICD-10-CM | POA: Diagnosis not present

## 2018-12-30 DIAGNOSIS — N2581 Secondary hyperparathyroidism of renal origin: Secondary | ICD-10-CM | POA: Diagnosis not present

## 2019-01-02 DIAGNOSIS — D509 Iron deficiency anemia, unspecified: Secondary | ICD-10-CM | POA: Diagnosis not present

## 2019-01-02 DIAGNOSIS — Z992 Dependence on renal dialysis: Secondary | ICD-10-CM | POA: Diagnosis not present

## 2019-01-02 DIAGNOSIS — I2510 Atherosclerotic heart disease of native coronary artery without angina pectoris: Secondary | ICD-10-CM | POA: Diagnosis not present

## 2019-01-02 DIAGNOSIS — N186 End stage renal disease: Secondary | ICD-10-CM | POA: Diagnosis not present

## 2019-01-02 DIAGNOSIS — N2581 Secondary hyperparathyroidism of renal origin: Secondary | ICD-10-CM | POA: Diagnosis not present

## 2019-01-02 DIAGNOSIS — D631 Anemia in chronic kidney disease: Secondary | ICD-10-CM | POA: Diagnosis not present

## 2019-01-02 DIAGNOSIS — I120 Hypertensive chronic kidney disease with stage 5 chronic kidney disease or end stage renal disease: Secondary | ICD-10-CM | POA: Diagnosis not present

## 2019-01-02 DIAGNOSIS — J449 Chronic obstructive pulmonary disease, unspecified: Secondary | ICD-10-CM | POA: Diagnosis not present

## 2019-01-02 DIAGNOSIS — M47817 Spondylosis without myelopathy or radiculopathy, lumbosacral region: Secondary | ICD-10-CM | POA: Diagnosis not present

## 2019-01-03 DIAGNOSIS — D509 Iron deficiency anemia, unspecified: Secondary | ICD-10-CM | POA: Diagnosis not present

## 2019-01-03 DIAGNOSIS — N2581 Secondary hyperparathyroidism of renal origin: Secondary | ICD-10-CM | POA: Diagnosis not present

## 2019-01-03 DIAGNOSIS — Z992 Dependence on renal dialysis: Secondary | ICD-10-CM | POA: Diagnosis not present

## 2019-01-03 DIAGNOSIS — D631 Anemia in chronic kidney disease: Secondary | ICD-10-CM | POA: Diagnosis not present

## 2019-01-03 DIAGNOSIS — N186 End stage renal disease: Secondary | ICD-10-CM | POA: Diagnosis not present

## 2019-01-05 DIAGNOSIS — D631 Anemia in chronic kidney disease: Secondary | ICD-10-CM | POA: Diagnosis not present

## 2019-01-05 DIAGNOSIS — N186 End stage renal disease: Secondary | ICD-10-CM | POA: Diagnosis not present

## 2019-01-05 DIAGNOSIS — N2581 Secondary hyperparathyroidism of renal origin: Secondary | ICD-10-CM | POA: Diagnosis not present

## 2019-01-05 DIAGNOSIS — Z992 Dependence on renal dialysis: Secondary | ICD-10-CM | POA: Diagnosis not present

## 2019-01-05 DIAGNOSIS — D509 Iron deficiency anemia, unspecified: Secondary | ICD-10-CM | POA: Diagnosis not present

## 2019-01-06 DIAGNOSIS — D509 Iron deficiency anemia, unspecified: Secondary | ICD-10-CM | POA: Diagnosis not present

## 2019-01-06 DIAGNOSIS — I120 Hypertensive chronic kidney disease with stage 5 chronic kidney disease or end stage renal disease: Secondary | ICD-10-CM | POA: Diagnosis not present

## 2019-01-06 DIAGNOSIS — D631 Anemia in chronic kidney disease: Secondary | ICD-10-CM | POA: Diagnosis not present

## 2019-01-06 DIAGNOSIS — Z992 Dependence on renal dialysis: Secondary | ICD-10-CM | POA: Diagnosis not present

## 2019-01-06 DIAGNOSIS — I2510 Atherosclerotic heart disease of native coronary artery without angina pectoris: Secondary | ICD-10-CM | POA: Diagnosis not present

## 2019-01-06 DIAGNOSIS — N2581 Secondary hyperparathyroidism of renal origin: Secondary | ICD-10-CM | POA: Diagnosis not present

## 2019-01-06 DIAGNOSIS — M47817 Spondylosis without myelopathy or radiculopathy, lumbosacral region: Secondary | ICD-10-CM | POA: Diagnosis not present

## 2019-01-06 DIAGNOSIS — N186 End stage renal disease: Secondary | ICD-10-CM | POA: Diagnosis not present

## 2019-01-06 DIAGNOSIS — J449 Chronic obstructive pulmonary disease, unspecified: Secondary | ICD-10-CM | POA: Diagnosis not present

## 2019-01-09 DIAGNOSIS — M47817 Spondylosis without myelopathy or radiculopathy, lumbosacral region: Secondary | ICD-10-CM | POA: Diagnosis not present

## 2019-01-09 DIAGNOSIS — Z992 Dependence on renal dialysis: Secondary | ICD-10-CM | POA: Diagnosis not present

## 2019-01-09 DIAGNOSIS — I120 Hypertensive chronic kidney disease with stage 5 chronic kidney disease or end stage renal disease: Secondary | ICD-10-CM | POA: Diagnosis not present

## 2019-01-09 DIAGNOSIS — J449 Chronic obstructive pulmonary disease, unspecified: Secondary | ICD-10-CM | POA: Diagnosis not present

## 2019-01-09 DIAGNOSIS — D509 Iron deficiency anemia, unspecified: Secondary | ICD-10-CM | POA: Diagnosis not present

## 2019-01-09 DIAGNOSIS — D631 Anemia in chronic kidney disease: Secondary | ICD-10-CM | POA: Diagnosis not present

## 2019-01-09 DIAGNOSIS — N186 End stage renal disease: Secondary | ICD-10-CM | POA: Diagnosis not present

## 2019-01-09 DIAGNOSIS — N2581 Secondary hyperparathyroidism of renal origin: Secondary | ICD-10-CM | POA: Diagnosis not present

## 2019-01-09 DIAGNOSIS — I2510 Atherosclerotic heart disease of native coronary artery without angina pectoris: Secondary | ICD-10-CM | POA: Diagnosis not present

## 2019-01-10 DIAGNOSIS — D509 Iron deficiency anemia, unspecified: Secondary | ICD-10-CM | POA: Diagnosis not present

## 2019-01-10 DIAGNOSIS — N186 End stage renal disease: Secondary | ICD-10-CM | POA: Diagnosis not present

## 2019-01-10 DIAGNOSIS — D631 Anemia in chronic kidney disease: Secondary | ICD-10-CM | POA: Diagnosis not present

## 2019-01-10 DIAGNOSIS — Z992 Dependence on renal dialysis: Secondary | ICD-10-CM | POA: Diagnosis not present

## 2019-01-10 DIAGNOSIS — N2581 Secondary hyperparathyroidism of renal origin: Secondary | ICD-10-CM | POA: Diagnosis not present

## 2019-01-11 ENCOUNTER — Other Ambulatory Visit: Payer: Self-pay | Admitting: Internal Medicine

## 2019-01-11 ENCOUNTER — Other Ambulatory Visit: Payer: Self-pay

## 2019-01-11 NOTE — Patient Outreach (Signed)
Screening: Medicare tier 5  Placed call to patient to complete telephone screening. No answer. Left message requesting a call back.  PLAN: will await a call back. Will mail unsuccessful outreach letter. Will call back in 3 business days.  Tomasa Rand, RN, BSN, CEN [facility] [employer] [phone]

## 2019-01-12 DIAGNOSIS — D509 Iron deficiency anemia, unspecified: Secondary | ICD-10-CM | POA: Diagnosis not present

## 2019-01-12 DIAGNOSIS — N2581 Secondary hyperparathyroidism of renal origin: Secondary | ICD-10-CM | POA: Diagnosis not present

## 2019-01-12 DIAGNOSIS — D631 Anemia in chronic kidney disease: Secondary | ICD-10-CM | POA: Diagnosis not present

## 2019-01-12 DIAGNOSIS — Z992 Dependence on renal dialysis: Secondary | ICD-10-CM | POA: Diagnosis not present

## 2019-01-12 DIAGNOSIS — N186 End stage renal disease: Secondary | ICD-10-CM | POA: Diagnosis not present

## 2019-01-13 DIAGNOSIS — J449 Chronic obstructive pulmonary disease, unspecified: Secondary | ICD-10-CM | POA: Diagnosis not present

## 2019-01-13 DIAGNOSIS — D509 Iron deficiency anemia, unspecified: Secondary | ICD-10-CM | POA: Diagnosis not present

## 2019-01-13 DIAGNOSIS — Z992 Dependence on renal dialysis: Secondary | ICD-10-CM | POA: Diagnosis not present

## 2019-01-13 DIAGNOSIS — I2510 Atherosclerotic heart disease of native coronary artery without angina pectoris: Secondary | ICD-10-CM | POA: Diagnosis not present

## 2019-01-13 DIAGNOSIS — I120 Hypertensive chronic kidney disease with stage 5 chronic kidney disease or end stage renal disease: Secondary | ICD-10-CM | POA: Diagnosis not present

## 2019-01-13 DIAGNOSIS — N2581 Secondary hyperparathyroidism of renal origin: Secondary | ICD-10-CM | POA: Diagnosis not present

## 2019-01-13 DIAGNOSIS — D631 Anemia in chronic kidney disease: Secondary | ICD-10-CM | POA: Diagnosis not present

## 2019-01-13 DIAGNOSIS — M47817 Spondylosis without myelopathy or radiculopathy, lumbosacral region: Secondary | ICD-10-CM | POA: Diagnosis not present

## 2019-01-13 DIAGNOSIS — N186 End stage renal disease: Secondary | ICD-10-CM | POA: Diagnosis not present

## 2019-01-14 DIAGNOSIS — E440 Moderate protein-calorie malnutrition: Secondary | ICD-10-CM | POA: Diagnosis not present

## 2019-01-14 DIAGNOSIS — B20 Human immunodeficiency virus [HIV] disease: Secondary | ICD-10-CM | POA: Diagnosis not present

## 2019-01-14 DIAGNOSIS — I120 Hypertensive chronic kidney disease with stage 5 chronic kidney disease or end stage renal disease: Secondary | ICD-10-CM | POA: Diagnosis not present

## 2019-01-14 DIAGNOSIS — H5213 Myopia, bilateral: Secondary | ICD-10-CM | POA: Diagnosis not present

## 2019-01-14 DIAGNOSIS — G40909 Epilepsy, unspecified, not intractable, without status epilepticus: Secondary | ICD-10-CM | POA: Diagnosis not present

## 2019-01-14 DIAGNOSIS — Z9181 History of falling: Secondary | ICD-10-CM | POA: Diagnosis not present

## 2019-01-14 DIAGNOSIS — Z7982 Long term (current) use of aspirin: Secondary | ICD-10-CM | POA: Diagnosis not present

## 2019-01-14 DIAGNOSIS — J449 Chronic obstructive pulmonary disease, unspecified: Secondary | ICD-10-CM | POA: Diagnosis not present

## 2019-01-14 DIAGNOSIS — I340 Nonrheumatic mitral (valve) insufficiency: Secondary | ICD-10-CM | POA: Diagnosis not present

## 2019-01-14 DIAGNOSIS — F418 Other specified anxiety disorders: Secondary | ICD-10-CM | POA: Diagnosis not present

## 2019-01-14 DIAGNOSIS — H2510 Age-related nuclear cataract, unspecified eye: Secondary | ICD-10-CM | POA: Diagnosis not present

## 2019-01-14 DIAGNOSIS — D631 Anemia in chronic kidney disease: Secondary | ICD-10-CM | POA: Diagnosis not present

## 2019-01-14 DIAGNOSIS — G4733 Obstructive sleep apnea (adult) (pediatric): Secondary | ICD-10-CM | POA: Diagnosis not present

## 2019-01-14 DIAGNOSIS — N186 End stage renal disease: Secondary | ICD-10-CM | POA: Diagnosis not present

## 2019-01-14 DIAGNOSIS — I2510 Atherosclerotic heart disease of native coronary artery without angina pectoris: Secondary | ICD-10-CM | POA: Diagnosis not present

## 2019-01-14 DIAGNOSIS — D696 Thrombocytopenia, unspecified: Secondary | ICD-10-CM | POA: Diagnosis not present

## 2019-01-14 DIAGNOSIS — Z952 Presence of prosthetic heart valve: Secondary | ICD-10-CM | POA: Diagnosis not present

## 2019-01-14 DIAGNOSIS — M47817 Spondylosis without myelopathy or radiculopathy, lumbosacral region: Secondary | ICD-10-CM | POA: Diagnosis not present

## 2019-01-16 ENCOUNTER — Other Ambulatory Visit: Payer: Self-pay

## 2019-01-16 DIAGNOSIS — N186 End stage renal disease: Secondary | ICD-10-CM | POA: Diagnosis not present

## 2019-01-16 DIAGNOSIS — Z992 Dependence on renal dialysis: Secondary | ICD-10-CM | POA: Diagnosis not present

## 2019-01-16 DIAGNOSIS — D509 Iron deficiency anemia, unspecified: Secondary | ICD-10-CM | POA: Diagnosis not present

## 2019-01-16 DIAGNOSIS — N2581 Secondary hyperparathyroidism of renal origin: Secondary | ICD-10-CM | POA: Diagnosis not present

## 2019-01-16 DIAGNOSIS — D631 Anemia in chronic kidney disease: Secondary | ICD-10-CM | POA: Diagnosis not present

## 2019-01-16 NOTE — Patient Outreach (Signed)
Screening:  2nd attempt to reach patient for Medicare tier 5 screening:  No answer.    PLAN: will attempt outreach again in 3 days. Outreach letter already mailed.  Tomasa Rand, RN, BSN, CEN [facility] [employer] [phone]

## 2019-01-17 DIAGNOSIS — D509 Iron deficiency anemia, unspecified: Secondary | ICD-10-CM | POA: Diagnosis not present

## 2019-01-17 DIAGNOSIS — N186 End stage renal disease: Secondary | ICD-10-CM | POA: Diagnosis not present

## 2019-01-17 DIAGNOSIS — D631 Anemia in chronic kidney disease: Secondary | ICD-10-CM | POA: Diagnosis not present

## 2019-01-17 DIAGNOSIS — Z992 Dependence on renal dialysis: Secondary | ICD-10-CM | POA: Diagnosis not present

## 2019-01-17 DIAGNOSIS — N2581 Secondary hyperparathyroidism of renal origin: Secondary | ICD-10-CM | POA: Diagnosis not present

## 2019-01-18 DIAGNOSIS — N186 End stage renal disease: Secondary | ICD-10-CM | POA: Diagnosis not present

## 2019-01-18 DIAGNOSIS — J449 Chronic obstructive pulmonary disease, unspecified: Secondary | ICD-10-CM | POA: Diagnosis not present

## 2019-01-18 DIAGNOSIS — I120 Hypertensive chronic kidney disease with stage 5 chronic kidney disease or end stage renal disease: Secondary | ICD-10-CM | POA: Diagnosis not present

## 2019-01-18 DIAGNOSIS — M47817 Spondylosis without myelopathy or radiculopathy, lumbosacral region: Secondary | ICD-10-CM | POA: Diagnosis not present

## 2019-01-18 DIAGNOSIS — D631 Anemia in chronic kidney disease: Secondary | ICD-10-CM | POA: Diagnosis not present

## 2019-01-18 DIAGNOSIS — I2510 Atherosclerotic heart disease of native coronary artery without angina pectoris: Secondary | ICD-10-CM | POA: Diagnosis not present

## 2019-01-19 ENCOUNTER — Other Ambulatory Visit: Payer: Self-pay

## 2019-01-19 DIAGNOSIS — I2510 Atherosclerotic heart disease of native coronary artery without angina pectoris: Secondary | ICD-10-CM | POA: Diagnosis not present

## 2019-01-19 DIAGNOSIS — D631 Anemia in chronic kidney disease: Secondary | ICD-10-CM | POA: Diagnosis not present

## 2019-01-19 DIAGNOSIS — D509 Iron deficiency anemia, unspecified: Secondary | ICD-10-CM | POA: Diagnosis not present

## 2019-01-19 DIAGNOSIS — I120 Hypertensive chronic kidney disease with stage 5 chronic kidney disease or end stage renal disease: Secondary | ICD-10-CM | POA: Diagnosis not present

## 2019-01-19 DIAGNOSIS — N186 End stage renal disease: Secondary | ICD-10-CM | POA: Diagnosis not present

## 2019-01-19 DIAGNOSIS — N2581 Secondary hyperparathyroidism of renal origin: Secondary | ICD-10-CM | POA: Diagnosis not present

## 2019-01-19 DIAGNOSIS — Z992 Dependence on renal dialysis: Secondary | ICD-10-CM | POA: Diagnosis not present

## 2019-01-19 DIAGNOSIS — M47817 Spondylosis without myelopathy or radiculopathy, lumbosacral region: Secondary | ICD-10-CM | POA: Diagnosis not present

## 2019-01-19 DIAGNOSIS — J449 Chronic obstructive pulmonary disease, unspecified: Secondary | ICD-10-CM | POA: Diagnosis not present

## 2019-01-19 NOTE — Patient Outreach (Signed)
Screening:  3rd unsuccessful outreach attempt to patient for screening:  PLAN: will await a call back from outreach letter. If no response by May 6th will plan to close case.  Tomasa Rand, RN, BSN, CEN [facility] [employer] [phone]

## 2019-01-20 DIAGNOSIS — N186 End stage renal disease: Secondary | ICD-10-CM | POA: Diagnosis not present

## 2019-01-20 DIAGNOSIS — D631 Anemia in chronic kidney disease: Secondary | ICD-10-CM | POA: Diagnosis not present

## 2019-01-20 DIAGNOSIS — Z992 Dependence on renal dialysis: Secondary | ICD-10-CM | POA: Diagnosis not present

## 2019-01-20 DIAGNOSIS — D509 Iron deficiency anemia, unspecified: Secondary | ICD-10-CM | POA: Diagnosis not present

## 2019-01-20 DIAGNOSIS — N2581 Secondary hyperparathyroidism of renal origin: Secondary | ICD-10-CM | POA: Diagnosis not present

## 2019-01-23 DIAGNOSIS — N186 End stage renal disease: Secondary | ICD-10-CM | POA: Diagnosis not present

## 2019-01-23 DIAGNOSIS — Z992 Dependence on renal dialysis: Secondary | ICD-10-CM | POA: Diagnosis not present

## 2019-01-23 DIAGNOSIS — D631 Anemia in chronic kidney disease: Secondary | ICD-10-CM | POA: Diagnosis not present

## 2019-01-23 DIAGNOSIS — N2581 Secondary hyperparathyroidism of renal origin: Secondary | ICD-10-CM | POA: Diagnosis not present

## 2019-01-23 DIAGNOSIS — D509 Iron deficiency anemia, unspecified: Secondary | ICD-10-CM | POA: Diagnosis not present

## 2019-01-23 MED FILL — ETHYL CHLORIDE SPRAY: 25 days supply | Qty: 104 | Fill #0

## 2019-01-24 DIAGNOSIS — Z992 Dependence on renal dialysis: Secondary | ICD-10-CM | POA: Diagnosis not present

## 2019-01-24 DIAGNOSIS — D631 Anemia in chronic kidney disease: Secondary | ICD-10-CM | POA: Diagnosis not present

## 2019-01-24 DIAGNOSIS — N2581 Secondary hyperparathyroidism of renal origin: Secondary | ICD-10-CM | POA: Diagnosis not present

## 2019-01-24 DIAGNOSIS — D509 Iron deficiency anemia, unspecified: Secondary | ICD-10-CM | POA: Diagnosis not present

## 2019-01-24 DIAGNOSIS — N186 End stage renal disease: Secondary | ICD-10-CM | POA: Diagnosis not present

## 2019-01-25 ENCOUNTER — Other Ambulatory Visit: Payer: Self-pay

## 2019-01-25 DIAGNOSIS — N186 End stage renal disease: Secondary | ICD-10-CM | POA: Diagnosis not present

## 2019-01-25 DIAGNOSIS — I2510 Atherosclerotic heart disease of native coronary artery without angina pectoris: Secondary | ICD-10-CM | POA: Diagnosis not present

## 2019-01-25 DIAGNOSIS — D631 Anemia in chronic kidney disease: Secondary | ICD-10-CM | POA: Diagnosis not present

## 2019-01-25 DIAGNOSIS — M47817 Spondylosis without myelopathy or radiculopathy, lumbosacral region: Secondary | ICD-10-CM | POA: Diagnosis not present

## 2019-01-25 DIAGNOSIS — I120 Hypertensive chronic kidney disease with stage 5 chronic kidney disease or end stage renal disease: Secondary | ICD-10-CM | POA: Diagnosis not present

## 2019-01-25 DIAGNOSIS — J449 Chronic obstructive pulmonary disease, unspecified: Secondary | ICD-10-CM | POA: Diagnosis not present

## 2019-01-25 NOTE — Patient Outreach (Signed)
Screening call/case closure:  Placed call to patient and another man answer and reports he is patients brother.  Patient got on the phone and reports that he is doing well and I can speak to his brother [name].  Patient reports he is having good days and bad days after heart surgery. Reports weight loss of 8 pounds but is now eating better, Reports taking all his medications as prescribed. Reports he has gotten calls and letters from [facility] and he does not need help. Reports he did not need last time when he spoke with someone from [facility].    Again explained to patient and brother that patient is high risk after heart surgery and we wanted to assure he is doing well and has no needs. Encouraged patient and or brother to call in the future with any needs.  Reviewed with brother I would let MD know patient has declined services.   PLAN: declines services at this time.  Acknowledged receiving outreach calls and letter.  Tomasa Rand, RN, BSN, CEN [facility] [employer] [phone]

## 2019-01-26 DIAGNOSIS — N2581 Secondary hyperparathyroidism of renal origin: Secondary | ICD-10-CM | POA: Diagnosis not present

## 2019-01-26 DIAGNOSIS — D509 Iron deficiency anemia, unspecified: Secondary | ICD-10-CM | POA: Diagnosis not present

## 2019-01-26 DIAGNOSIS — Z992 Dependence on renal dialysis: Secondary | ICD-10-CM | POA: Diagnosis not present

## 2019-01-26 DIAGNOSIS — D631 Anemia in chronic kidney disease: Secondary | ICD-10-CM | POA: Diagnosis not present

## 2019-01-26 DIAGNOSIS — N186 End stage renal disease: Secondary | ICD-10-CM | POA: Diagnosis not present

## 2019-01-27 DIAGNOSIS — M47817 Spondylosis without myelopathy or radiculopathy, lumbosacral region: Secondary | ICD-10-CM | POA: Diagnosis not present

## 2019-01-27 DIAGNOSIS — N186 End stage renal disease: Secondary | ICD-10-CM | POA: Diagnosis not present

## 2019-01-27 DIAGNOSIS — N2581 Secondary hyperparathyroidism of renal origin: Secondary | ICD-10-CM | POA: Diagnosis not present

## 2019-01-27 DIAGNOSIS — J449 Chronic obstructive pulmonary disease, unspecified: Secondary | ICD-10-CM | POA: Diagnosis not present

## 2019-01-27 DIAGNOSIS — Z992 Dependence on renal dialysis: Secondary | ICD-10-CM | POA: Diagnosis not present

## 2019-01-27 DIAGNOSIS — D509 Iron deficiency anemia, unspecified: Secondary | ICD-10-CM | POA: Diagnosis not present

## 2019-01-27 DIAGNOSIS — D631 Anemia in chronic kidney disease: Secondary | ICD-10-CM | POA: Diagnosis not present

## 2019-01-27 DIAGNOSIS — I120 Hypertensive chronic kidney disease with stage 5 chronic kidney disease or end stage renal disease: Secondary | ICD-10-CM | POA: Diagnosis not present

## 2019-01-27 DIAGNOSIS — I2510 Atherosclerotic heart disease of native coronary artery without angina pectoris: Secondary | ICD-10-CM | POA: Diagnosis not present

## 2019-01-30 ENCOUNTER — Other Ambulatory Visit: Payer: Self-pay | Admitting: Thoracic Surgery (Cardiothoracic Vascular Surgery)

## 2019-01-30 ENCOUNTER — Other Ambulatory Visit: Payer: Self-pay

## 2019-01-30 DIAGNOSIS — J449 Chronic obstructive pulmonary disease, unspecified: Secondary | ICD-10-CM | POA: Diagnosis not present

## 2019-01-30 DIAGNOSIS — M47817 Spondylosis without myelopathy or radiculopathy, lumbosacral region: Secondary | ICD-10-CM | POA: Diagnosis not present

## 2019-01-30 DIAGNOSIS — I2510 Atherosclerotic heart disease of native coronary artery without angina pectoris: Secondary | ICD-10-CM | POA: Diagnosis not present

## 2019-01-30 DIAGNOSIS — Z9889 Other specified postprocedural states: Secondary | ICD-10-CM

## 2019-01-30 DIAGNOSIS — I120 Hypertensive chronic kidney disease with stage 5 chronic kidney disease or end stage renal disease: Secondary | ICD-10-CM | POA: Diagnosis not present

## 2019-01-30 DIAGNOSIS — N2581 Secondary hyperparathyroidism of renal origin: Secondary | ICD-10-CM | POA: Diagnosis not present

## 2019-01-30 DIAGNOSIS — N186 End stage renal disease: Secondary | ICD-10-CM | POA: Diagnosis not present

## 2019-01-30 DIAGNOSIS — Z992 Dependence on renal dialysis: Secondary | ICD-10-CM | POA: Diagnosis not present

## 2019-01-30 DIAGNOSIS — D631 Anemia in chronic kidney disease: Secondary | ICD-10-CM | POA: Diagnosis not present

## 2019-01-30 DIAGNOSIS — D509 Iron deficiency anemia, unspecified: Secondary | ICD-10-CM | POA: Diagnosis not present

## 2019-01-30 DIAGNOSIS — J90 Pleural effusion, not elsewhere classified: Secondary | ICD-10-CM

## 2019-01-31 ENCOUNTER — Ambulatory Visit
Admission: RE | Admit: 2019-01-31 | Discharge: 2019-01-31 | Disposition: A | Discharge status: Home / Self Care | Payer: Medicare Other | Source: Ambulatory Visit | Attending: Thoracic Surgery (Cardiothoracic Vascular Surgery) | Admitting: Thoracic Surgery (Cardiothoracic Vascular Surgery)

## 2019-01-31 ENCOUNTER — Encounter: Payer: Self-pay | Admitting: Thoracic Surgery (Cardiothoracic Vascular Surgery)

## 2019-01-31 ENCOUNTER — Other Ambulatory Visit: Payer: Self-pay | Admitting: Physician Assistant

## 2019-01-31 ENCOUNTER — Ambulatory Visit (INDEPENDENT_AMBULATORY_CARE_PROVIDER_SITE_OTHER): Payer: Self-pay | Admitting: Thoracic Surgery (Cardiothoracic Vascular Surgery)

## 2019-01-31 VITALS — BP 140/80 | HR 60 | Temp 98.2°F | Resp 16 | Ht 72.0 in | Wt 171.1 lb

## 2019-01-31 DIAGNOSIS — J90 Pleural effusion, not elsewhere classified: Secondary | ICD-10-CM

## 2019-01-31 DIAGNOSIS — D631 Anemia in chronic kidney disease: Secondary | ICD-10-CM | POA: Diagnosis not present

## 2019-01-31 DIAGNOSIS — N186 End stage renal disease: Secondary | ICD-10-CM | POA: Diagnosis not present

## 2019-01-31 DIAGNOSIS — N2581 Secondary hyperparathyroidism of renal origin: Secondary | ICD-10-CM | POA: Diagnosis not present

## 2019-01-31 DIAGNOSIS — Z9889 Other specified postprocedural states: Secondary | ICD-10-CM

## 2019-01-31 DIAGNOSIS — D509 Iron deficiency anemia, unspecified: Secondary | ICD-10-CM | POA: Diagnosis not present

## 2019-01-31 DIAGNOSIS — Z992 Dependence on renal dialysis: Secondary | ICD-10-CM | POA: Diagnosis not present

## 2019-01-31 NOTE — Progress Notes (Signed)
[address]       [place] [address]             [phone]     HPI: Jeffrey. Gregson returns for a scheduled follow-up visit  Jeffrey [name] is a 63 year old man past history of end-stage renal failure on home dialysis, HIV, hepatitis B, aortic valve endocarditis, aortic valve replacement, tubulovillous adenoma of the duodenum, hypertension, hypothyroidism, seizures, peripheral arterial disease, thrombocytopenia, and left fibrothorax.  He had an aortic valve replacement in 2019.  He developed a left pleural effusion postoperatively.  I did a left VATS and drainage of the effusion and decortication of the fibrothorax on 11/04/2018.  He went home on day 7.  I saw him in the office on 11/29/2018.  He was doing well at that time from a respiratory and cardiac standpoint.  He did have pain and limited movement in the left shoulder.  He only has pain with the sternal incision when it is about to rain.  He is continued to have pain and limited abduction of his left shoulder.  He continues to work with PT and OT on that.  Past Medical History:  Diagnosis Date  . Anemia   . Anxiety   . Aortic valve stenosis   . Arthritis   . Asthma    per pt hx  . Dyspnea   . End stage renal disease on home HD 07/10/2011   Started HD in September 2012 at [facility] with a tunneled HD catheter, now on home HD with NxtStage. Dialyzing through AVF L lower arm with buttonhole technique as of mid 2014. His brother does the HD treatments at home.  They are roommates for 23 years.  The brother works 3rd shift and gets off about 8am and then puts Jeffrey [name] on HD in the morning after getting home. Most of the time he does HD about 4 times a week, for about 4 hours per treatment. Cause of ESRD was HTN according to patient. He says he let his health go and ending up with complications, and that he didn't like seeing doctors in those days.  He says he was diagnosed with severe HTN when he lived in [place] in his 47's.    . Hepatitis B carrier ([place])   . HIV infection ([place])   . Hypertension   . Hyperthyroidism    normal now  . Pneumonia several yrs ago  . Seizures ([place]) 06/02/2011   had a mild one in Sept. 2019  . Sleep apnea    to start Cpap soon  . Thrombocytopenia ([place])     Current Outpatient Medications  Medication Sig Dispense Refill  . acetaminophen (TYLENOL) 325 MG tablet Take 2 tablets (650 mg total) by mouth every 6 (six) hours as needed for fever, headache, mild pain or moderate pain.    Marland Kitchen albuterol (PROVENTIL) (2.5 MG/3ML) 0.083% nebulizer solution Take 3 mLs (2.5 mg total) by nebulization every 6 (six) hours as needed for wheezing or shortness of breath. 360 mL 0  . Amino Acids-Protein Hydrolys (FEEDING SUPPLEMENT, PRO-STAT SUGAR FREE 64,) LIQD Take 30 mLs by mouth 2 (two) times daily. (Patient taking differently: Take 30 mLs by mouth daily. ) 900 mL 0  . amLODipine (NORVASC) 10 MG tablet Take 1 tablet (10 mg total) by mouth daily. 90 tablet 1  . aspirin EC 81 MG tablet Take 81 mg by mouth daily.    Marland Kitchen BIKTARVY 50-200-25 MG TABS tablet TAKE 1  TABLET BY MOUTH DAILY 30 tablet 5  . cloNIDine (CATAPRES - DOSED IN MG/24 HR) 0.3 mg/24hr patch APPLY 1 PATCH(0.3 MG) EXTERNALLY TO THE SKIN 1 TIME A WEEK 4 patch 3  . Epoetin Alfa (EPOGEN IJ) Inject as directed. TWICE A WEEK AT DIALYSIS    . ferric gluconate 125 mg in sodium chloride 0.9 % 100 mL Inject 125 mg into the vein every Monday, Wednesday, and Friday with hemodialysis.    . hydrALAZINE (APRESOLINE) 100 MG tablet Take 1 tablet (100 mg total) by mouth 3 (three) times daily. 90 tablet 1  . isosorbide mononitrate (ISMO,MONOKET) 20 MG tablet Take 1 tablet (20 mg total) by mouth 2 (two) times daily at 10 AM and 5 PM. 180 tablet 1  . Lacosamide (VIMPAT) 150 MG TABS Take 1 tablet (150 mg total) by mouth 2 (two) times daily. 60 tablet 5  . lactulose (CHRONULAC) 10 GM/15ML solution Take 30 mLs (20 g total) by mouth daily as needed for moderate  constipation. 473 mL 1  . losartan (COZAAR) 25 MG tablet Take 25 mg by mouth daily.    . multivitamin (RENA-VIT) TABS tablet Take 1 tablet by mouth daily. 30 tablet 0  . omeprazole (PRILOSEC) 20 MG capsule Take 1 capsule (20 mg total) by mouth daily. 90 capsule 1  . traMADol (ULTRAM) 50 MG tablet Take by mouth every 6 (six) hours as needed.    . traZODone (DESYREL) 50 MG tablet Take 1 tablet (50 mg total) by mouth at bedtime as needed for sleep. 30 tablet 0   No current facility-administered medications for this visit.    Facility-Administered Medications Ordered in Other Visits  Medication Dose Route Frequency Provider Last Rate Last Dose  . etomidate (AMIDATE) injection    Anesthesia Intra-op Myna Bright, CRNA   16 mg at 07/10/18 2031  . rocuronium bromide 10 mg/mL (PF) syringe   Intravenous Anesthesia Intra-op Myna Bright, CRNA   50 mg at 07/10/18 2031    Physical Exam BP 140/80 (BP Location: Right Arm, Patient Position: Sitting, Cuff Size: Large)   Pulse 60   Temp 98.2 F (36.8 C) (Skin)   Resp 16   Ht 6' (1.829 m)   Wt 171 lb 1.2 oz (77.6 kg)   BMI 23.67 kg/m  63 year old man in no acute distress Alert and oriented x3 with no focal deficits Cardiac regular rate and rhythm with a 2/6 systolic murmur Lungs clear with equal breath sounds bilaterally Incisions well-healed Limited abduction of left shoulder, pain with passive ROM  Diagnostic Tests: CHEST - 2 VIEW  COMPARISON:  11/29/2018  FINDINGS: Previous median sternotomy and aortic valve replacement. Chronic cardiomegaly with left ventricular prominence. Chronic aortic atherosclerosis. Chronic bilateral lung markings, improved since the study of March. Mild pleural scarring but without evidence residual frank effusion. No worsening or new finding.  IMPRESSION: Status post aortic valve replacement. Chronic pleural and parenchymal scarring as seen previously. No evidence of recurrent heart failure or  effusion.   Electronically Signed   By: Nelson Chimes M.D.   On: 01/31/2019 10:34 I personally reviewed the chest x-ray and concur with the findings noted above.  There is improvement compared to his film from March 2020.  Impression: Jeffrey [name] is a 63 year old man who had aortic valve replacement for recurrent aortic valve endocarditis in 2019 and then developed a recurrent left pleural effusion.  That ultimately required left VATS for drainage of the effusion and decortication of the fibrothorax in February 2020.  He has done well since that surgery.  His chest x-ray today shows continued improvement in comparison to his last film.  No further follow-up is needed for that issue.  Aortic valve replacement-history of recurrent endocarditis.  He is on home dialysis.  He and his brother understand the importance of strict sterile technique.  End-stage renal disease-on home hemodialysis.   Plan: Continue PT and OT for left shoulder. I will be happy to see Jeffrey. Crisanto back at anytime in the future if I can be of any further assistance with his care.  [name], MD [facility] [phone]

## 2019-02-01 DIAGNOSIS — D631 Anemia in chronic kidney disease: Secondary | ICD-10-CM | POA: Diagnosis not present

## 2019-02-01 DIAGNOSIS — M47817 Spondylosis without myelopathy or radiculopathy, lumbosacral region: Secondary | ICD-10-CM | POA: Diagnosis not present

## 2019-02-01 DIAGNOSIS — N186 End stage renal disease: Secondary | ICD-10-CM | POA: Diagnosis not present

## 2019-02-01 DIAGNOSIS — J449 Chronic obstructive pulmonary disease, unspecified: Secondary | ICD-10-CM | POA: Diagnosis not present

## 2019-02-01 DIAGNOSIS — I2510 Atherosclerotic heart disease of native coronary artery without angina pectoris: Secondary | ICD-10-CM | POA: Diagnosis not present

## 2019-02-01 DIAGNOSIS — I120 Hypertensive chronic kidney disease with stage 5 chronic kidney disease or end stage renal disease: Secondary | ICD-10-CM | POA: Diagnosis not present

## 2019-02-02 DIAGNOSIS — N2581 Secondary hyperparathyroidism of renal origin: Secondary | ICD-10-CM | POA: Diagnosis not present

## 2019-02-02 DIAGNOSIS — N186 End stage renal disease: Secondary | ICD-10-CM | POA: Diagnosis not present

## 2019-02-02 DIAGNOSIS — D631 Anemia in chronic kidney disease: Secondary | ICD-10-CM | POA: Diagnosis not present

## 2019-02-02 DIAGNOSIS — D509 Iron deficiency anemia, unspecified: Secondary | ICD-10-CM | POA: Diagnosis not present

## 2019-02-02 DIAGNOSIS — Z992 Dependence on renal dialysis: Secondary | ICD-10-CM | POA: Diagnosis not present

## 2019-02-03 DIAGNOSIS — D509 Iron deficiency anemia, unspecified: Secondary | ICD-10-CM | POA: Diagnosis not present

## 2019-02-03 DIAGNOSIS — D631 Anemia in chronic kidney disease: Secondary | ICD-10-CM | POA: Diagnosis not present

## 2019-02-03 DIAGNOSIS — N2581 Secondary hyperparathyroidism of renal origin: Secondary | ICD-10-CM | POA: Diagnosis not present

## 2019-02-03 DIAGNOSIS — Z992 Dependence on renal dialysis: Secondary | ICD-10-CM | POA: Diagnosis not present

## 2019-02-03 DIAGNOSIS — N186 End stage renal disease: Secondary | ICD-10-CM | POA: Diagnosis not present

## 2019-02-06 DIAGNOSIS — I2510 Atherosclerotic heart disease of native coronary artery without angina pectoris: Secondary | ICD-10-CM | POA: Diagnosis not present

## 2019-02-06 DIAGNOSIS — D631 Anemia in chronic kidney disease: Secondary | ICD-10-CM | POA: Diagnosis not present

## 2019-02-06 DIAGNOSIS — J449 Chronic obstructive pulmonary disease, unspecified: Secondary | ICD-10-CM | POA: Diagnosis not present

## 2019-02-06 DIAGNOSIS — N186 End stage renal disease: Secondary | ICD-10-CM | POA: Diagnosis not present

## 2019-02-06 DIAGNOSIS — D509 Iron deficiency anemia, unspecified: Secondary | ICD-10-CM | POA: Diagnosis not present

## 2019-02-06 DIAGNOSIS — Z992 Dependence on renal dialysis: Secondary | ICD-10-CM | POA: Diagnosis not present

## 2019-02-06 DIAGNOSIS — N2581 Secondary hyperparathyroidism of renal origin: Secondary | ICD-10-CM | POA: Diagnosis not present

## 2019-02-06 DIAGNOSIS — I120 Hypertensive chronic kidney disease with stage 5 chronic kidney disease or end stage renal disease: Secondary | ICD-10-CM | POA: Diagnosis not present

## 2019-02-06 DIAGNOSIS — M47817 Spondylosis without myelopathy or radiculopathy, lumbosacral region: Secondary | ICD-10-CM | POA: Diagnosis not present

## 2019-02-07 DIAGNOSIS — N186 End stage renal disease: Secondary | ICD-10-CM | POA: Diagnosis not present

## 2019-02-07 DIAGNOSIS — D509 Iron deficiency anemia, unspecified: Secondary | ICD-10-CM | POA: Diagnosis not present

## 2019-02-07 DIAGNOSIS — D631 Anemia in chronic kidney disease: Secondary | ICD-10-CM | POA: Diagnosis not present

## 2019-02-07 DIAGNOSIS — N2581 Secondary hyperparathyroidism of renal origin: Secondary | ICD-10-CM | POA: Diagnosis not present

## 2019-02-07 DIAGNOSIS — Z992 Dependence on renal dialysis: Secondary | ICD-10-CM | POA: Diagnosis not present

## 2019-02-08 DIAGNOSIS — N186 End stage renal disease: Secondary | ICD-10-CM | POA: Diagnosis not present

## 2019-02-08 DIAGNOSIS — D631 Anemia in chronic kidney disease: Secondary | ICD-10-CM | POA: Diagnosis not present

## 2019-02-08 DIAGNOSIS — I120 Hypertensive chronic kidney disease with stage 5 chronic kidney disease or end stage renal disease: Secondary | ICD-10-CM | POA: Diagnosis not present

## 2019-02-08 DIAGNOSIS — I2510 Atherosclerotic heart disease of native coronary artery without angina pectoris: Secondary | ICD-10-CM | POA: Diagnosis not present

## 2019-02-08 DIAGNOSIS — M47817 Spondylosis without myelopathy or radiculopathy, lumbosacral region: Secondary | ICD-10-CM | POA: Diagnosis not present

## 2019-02-08 DIAGNOSIS — J449 Chronic obstructive pulmonary disease, unspecified: Secondary | ICD-10-CM | POA: Diagnosis not present

## 2019-02-09 ENCOUNTER — Other Ambulatory Visit: Payer: Self-pay | Admitting: Adult Health

## 2019-02-09 DIAGNOSIS — Z992 Dependence on renal dialysis: Secondary | ICD-10-CM | POA: Diagnosis not present

## 2019-02-09 DIAGNOSIS — D509 Iron deficiency anemia, unspecified: Secondary | ICD-10-CM | POA: Diagnosis not present

## 2019-02-09 DIAGNOSIS — N186 End stage renal disease: Secondary | ICD-10-CM | POA: Diagnosis not present

## 2019-02-09 DIAGNOSIS — D631 Anemia in chronic kidney disease: Secondary | ICD-10-CM | POA: Diagnosis not present

## 2019-02-09 DIAGNOSIS — N2581 Secondary hyperparathyroidism of renal origin: Secondary | ICD-10-CM | POA: Diagnosis not present

## 2019-02-10 ENCOUNTER — Telehealth: Payer: Self-pay | Admitting: Physician Assistant

## 2019-02-10 DIAGNOSIS — N186 End stage renal disease: Secondary | ICD-10-CM | POA: Diagnosis not present

## 2019-02-10 DIAGNOSIS — N2581 Secondary hyperparathyroidism of renal origin: Secondary | ICD-10-CM | POA: Diagnosis not present

## 2019-02-10 DIAGNOSIS — Z992 Dependence on renal dialysis: Secondary | ICD-10-CM | POA: Diagnosis not present

## 2019-02-10 DIAGNOSIS — D509 Iron deficiency anemia, unspecified: Secondary | ICD-10-CM | POA: Diagnosis not present

## 2019-02-10 DIAGNOSIS — D631 Anemia in chronic kidney disease: Secondary | ICD-10-CM | POA: Diagnosis not present

## 2019-02-10 NOTE — Telephone Encounter (Signed)
Spoke with patient's brother, [name] (per DPR), who confirmed all demographics. Patient has smart phone and uses My Chart. Will have vitals ready for visit.

## 2019-02-12 ENCOUNTER — Other Ambulatory Visit: Payer: Self-pay | Admitting: Infectious Diseases

## 2019-02-12 DIAGNOSIS — G4709 Other insomnia: Secondary | ICD-10-CM

## 2019-02-13 DIAGNOSIS — E440 Moderate protein-calorie malnutrition: Secondary | ICD-10-CM | POA: Diagnosis not present

## 2019-02-13 DIAGNOSIS — I340 Nonrheumatic mitral (valve) insufficiency: Secondary | ICD-10-CM | POA: Diagnosis not present

## 2019-02-13 DIAGNOSIS — I2510 Atherosclerotic heart disease of native coronary artery without angina pectoris: Secondary | ICD-10-CM | POA: Diagnosis not present

## 2019-02-13 DIAGNOSIS — J449 Chronic obstructive pulmonary disease, unspecified: Secondary | ICD-10-CM | POA: Diagnosis not present

## 2019-02-13 DIAGNOSIS — Z9181 History of falling: Secondary | ICD-10-CM | POA: Diagnosis not present

## 2019-02-13 DIAGNOSIS — Z952 Presence of prosthetic heart valve: Secondary | ICD-10-CM | POA: Diagnosis not present

## 2019-02-13 DIAGNOSIS — N2581 Secondary hyperparathyroidism of renal origin: Secondary | ICD-10-CM | POA: Diagnosis not present

## 2019-02-13 DIAGNOSIS — G40909 Epilepsy, unspecified, not intractable, without status epilepticus: Secondary | ICD-10-CM | POA: Diagnosis not present

## 2019-02-13 DIAGNOSIS — D509 Iron deficiency anemia, unspecified: Secondary | ICD-10-CM | POA: Diagnosis not present

## 2019-02-13 DIAGNOSIS — M47817 Spondylosis without myelopathy or radiculopathy, lumbosacral region: Secondary | ICD-10-CM | POA: Diagnosis not present

## 2019-02-13 DIAGNOSIS — H2510 Age-related nuclear cataract, unspecified eye: Secondary | ICD-10-CM | POA: Diagnosis not present

## 2019-02-13 DIAGNOSIS — I120 Hypertensive chronic kidney disease with stage 5 chronic kidney disease or end stage renal disease: Secondary | ICD-10-CM | POA: Diagnosis not present

## 2019-02-13 DIAGNOSIS — N186 End stage renal disease: Secondary | ICD-10-CM | POA: Diagnosis not present

## 2019-02-13 DIAGNOSIS — F418 Other specified anxiety disorders: Secondary | ICD-10-CM | POA: Diagnosis not present

## 2019-02-13 DIAGNOSIS — D696 Thrombocytopenia, unspecified: Secondary | ICD-10-CM | POA: Diagnosis not present

## 2019-02-13 DIAGNOSIS — B20 Human immunodeficiency virus [HIV] disease: Secondary | ICD-10-CM | POA: Diagnosis not present

## 2019-02-13 DIAGNOSIS — D631 Anemia in chronic kidney disease: Secondary | ICD-10-CM | POA: Diagnosis not present

## 2019-02-13 DIAGNOSIS — Z992 Dependence on renal dialysis: Secondary | ICD-10-CM | POA: Diagnosis not present

## 2019-02-13 DIAGNOSIS — G4733 Obstructive sleep apnea (adult) (pediatric): Secondary | ICD-10-CM | POA: Diagnosis not present

## 2019-02-13 DIAGNOSIS — H5213 Myopia, bilateral: Secondary | ICD-10-CM | POA: Diagnosis not present

## 2019-02-13 DIAGNOSIS — Z7982 Long term (current) use of aspirin: Secondary | ICD-10-CM | POA: Diagnosis not present

## 2019-02-14 ENCOUNTER — Other Ambulatory Visit: Payer: Self-pay | Admitting: Internal Medicine

## 2019-02-14 DIAGNOSIS — Z992 Dependence on renal dialysis: Secondary | ICD-10-CM | POA: Diagnosis not present

## 2019-02-14 DIAGNOSIS — N2581 Secondary hyperparathyroidism of renal origin: Secondary | ICD-10-CM | POA: Diagnosis not present

## 2019-02-14 DIAGNOSIS — D631 Anemia in chronic kidney disease: Secondary | ICD-10-CM | POA: Diagnosis not present

## 2019-02-14 DIAGNOSIS — D509 Iron deficiency anemia, unspecified: Secondary | ICD-10-CM | POA: Diagnosis not present

## 2019-02-14 DIAGNOSIS — N186 End stage renal disease: Secondary | ICD-10-CM | POA: Diagnosis not present

## 2019-02-14 NOTE — Telephone Encounter (Signed)
Patient requesting a refill on Tramadol from the [facility] on [address].

## 2019-02-15 DIAGNOSIS — J449 Chronic obstructive pulmonary disease, unspecified: Secondary | ICD-10-CM | POA: Diagnosis not present

## 2019-02-15 DIAGNOSIS — I120 Hypertensive chronic kidney disease with stage 5 chronic kidney disease or end stage renal disease: Secondary | ICD-10-CM | POA: Diagnosis not present

## 2019-02-15 DIAGNOSIS — I2510 Atherosclerotic heart disease of native coronary artery without angina pectoris: Secondary | ICD-10-CM | POA: Diagnosis not present

## 2019-02-15 DIAGNOSIS — D631 Anemia in chronic kidney disease: Secondary | ICD-10-CM | POA: Diagnosis not present

## 2019-02-15 DIAGNOSIS — M47817 Spondylosis without myelopathy or radiculopathy, lumbosacral region: Secondary | ICD-10-CM | POA: Diagnosis not present

## 2019-02-15 DIAGNOSIS — N186 End stage renal disease: Secondary | ICD-10-CM | POA: Diagnosis not present

## 2019-02-15 NOTE — Telephone Encounter (Signed)
Looks like this was written by his CT surgeon's office. Please have him contact them for refills. Otherwise he will need an appointment. Thanks!

## 2019-02-15 NOTE — Progress Notes (Signed)
Virtual Visit via Video Note   This visit type was conducted due to national recommendations for restrictions regarding the COVID-19 Pandemic (e.g. social distancing) in an effort to limit this patient's exposure and mitigate transmission in our community.  Due to his co-morbid illnesses, this patient is at least at moderate risk for complications without adequate follow up.  This format is felt to be most appropriate for this patient at this time.  All issues noted in this document were discussed and addressed.  A limited physical exam was performed with this format.  Please refer to the patient's chart for his consent to telehealth for [facility]   Date:  02/16/2019   ID:  Jeffrey [name], DOB 10/20/1955, MRN [id number]  Patient Location: Home Provider Location: Home  PCP:  Jeffrey Ochs, MD  Cardiologist:  Jeffrey Mocha, MD  Electrophysiologist:  None  Nephrologist: Jeffrey [name] (Dr. Rosalyn [name], [facility])  Evaluation Performed:  Follow-Up Visit  Chief Complaint:  6 months follow up post CABG  History of Present Illness:    Jeffrey [name] is a 63 y.o. male with aortic insufficiency, end-stage renal disease on hemodialysis , HIV, hepatitis B, aortic valve endocarditis, aortic valve replacement, tubulovillous adenoma of the duodenum, hypertension, GI bleed, anemia of chronic disease, hypothyroidism, peripheral arterial disease, coronary artery disease  seen for follow up.  Cardiac catheterization in May 2018 demonstrated heavily calcified coronary arteries with mild diffuse nonobstructive CAD and severe aortic insufficiency.  He presented with shortness of breath and was admitted to the hospital in August 2019. He had methicillin-resistant staph epidermidis bacteremia.Transesophageal echocardiography showed a small vegetation and severe aortic insufficiency. He was treated with vancomycin.Last cath 07/2018 showed moderate LAD stenosis. Eventually he had an aortic valve replacement in  2019.  He developed a left pleural effusion postoperatively.  Dr. Roxan [name] did left VATS and drainage of the effusion and decortication of the fibrothorax on 11/04/2018.  Does OT/PT once a week without any issue. Walks every other day for 15 minutes. The patient denies nausea, vomiting, fever, chest pain, palpitations, shortness of breath, orthopnea, PND, dizziness, syncope, cough, congestion, abdominal pain, hematochezia, melena, lower extremity edema. Lives with brother who is primary caregiver.    The patient does not have symptoms concerning for COVID-19 infection (fever, chills, cough, or new shortness of breath).    Past Medical History:  Diagnosis Date   Anemia    Anxiety    Aortic valve stenosis    Arthritis    Asthma    per pt hx   Dyspnea    End stage renal disease on home HD 07/10/2011   Started HD in September 2012 at [facility] with a tunneled HD catheter, now on home HD with NxtStage. Dialyzing through AVF L lower arm with buttonhole technique as of mid 2014. His brother does the HD treatments at home.  They are roommates for 23 years.  The brother works 3rd shift and gets off about 8am and then puts Jeffrey [name] on HD in the morning after getting home. Most of the time he does HD about 4 times a week, for about 4 hours per treatment. Cause of ESRD was HTN according to patient. He says he let his health go and ending up with complications, and that he didn't like seeing doctors in those days.  He says he was diagnosed with severe HTN when he lived in [place] in his 16's.    Hepatitis B carrier ([place])    HIV infection ([place])  Hypertension    Hyperthyroidism    normal now   Pneumonia several yrs ago   Seizures ([place]) 06/02/2011   had a mild one in Sept. 2019   Sleep apnea    to start Cpap soon   Thrombocytopenia [facility])    Past Surgical History:  Procedure Laterality Date   AORTIC VALVE REPLACEMENT N/A 07/28/2018   Procedure: AORTIC VALVE REPLACEMENT (AVR) using  Inspiris Resilia  size 23 Aortic Valve.;  Surgeon: [name], MD;  Location: [place];  Service: Open Heart Surgery;  Laterality: N/A;   AV FISTULA PLACEMENT  06/02/11   Left radiocephalic AVF   BIOPSY  7/61/9509   Procedure: BIOPSY;  Surgeon: Milus Banister, MD;  [facility]: [facility];  Service: Endoscopy;;   CHEST TUBE INSERTION Left 11/04/2018   Procedure: Chest Tube Insertion;  Surgeon: [name], MD;  Location: [place];  Service: Thoracic;  Laterality: Left;   COLONOSCOPY     COLONOSCOPY WITH PROPOFOL N/A 01/27/2018   Procedure: COLONOSCOPY WITH PROPOFOL;  Surgeon: [name], MD;  Location: [facility] END[facility]COPY;  Service: Gastroenterology;  Laterality: N/A;   CORONARY ANGIOPLASTY     DECORTICATION Left 11/04/2018   Procedure: Left Lung DECORTICATION;  Surgeon: [name], MD;  Location: [place];  Service: Thoracic;  Laterality: Left;   ERCP  03/21/2018   AT [place]   ESOPHAGOGASTRODUODENOSCOPY N/A 02/17/2018   Procedure: ESOPHAGOGASTRODUODENOSCOPY (EGD);  Surgeon: Milus Banister, MD;  Location: [facility];  Service: Endoscopy;  Laterality: N/A;   ESOPHAGOGASTRODUODENOSCOPY (EGD) WITH PROPOFOL N/A 01/27/2018   Procedure: ESOPHAGOGASTRODUODENOSCOPY (EGD) WITH PROPOFOL;  Surgeon: [name], MD;  Location: [facility]COPY;  Service: Gastroenterology;  Laterality: N/A;   ESOPHAGOGASTRODUODENOSCOPY (EGD) WITH PROPOFOL N/A 04/03/2018   Procedure: ESOPHAGOGASTRODUODENOSCOPY (EGD) WITH PROPOFOL;  Surgeon: Clarene Essex, MD;  Location: [place];  Service: Endoscopy;  Laterality: N/A;   EUS N/A 02/17/2018   Procedure: UPPER ENDOSCOPIC ULTRASOUND (EUS) RADIAL;  Surgeon: Milus Banister, MD;  [facility];  Service: Endoscopy;  Laterality: N/A;   EYE SURGERY Left 05/2018   cataract surgery    fistulaogram     x 2 last 2 years   IR DIALY SHUNT INTRO NEEDLE/INTRACATH INITIAL W/IMG LEFT Left 04/20/2018   IR FLUORO GUIDE CV LINE RIGHT  09/16/2017    IR THORACENTESIS ASP PLEURAL SPACE W/IMG GUIDE  10/04/2018   IR US GUIDE VASC ACCESS RIGHT  09/16/2017   IRRIGATION AND DEBRIDEMENT KNEE Left 09/15/2017   Procedure: IRRIGATION AND DEBRIDEMENT KNEE; arthroscopic clean out;  Surgeon: [name], MD;  [facility];  Service: Orthopedics;  Laterality: Left;   OTHER SURGICAL HISTORY     removal temporary HD catheter    PLEURAL EFFUSION DRAINAGE Left 11/04/2018   Procedure: DRAINAGE OF PLEURAL EFFUSION;  Surgeon: [name], MD;  Location: [place];  Service: Thoracic;  Laterality: Left;   REVISON OF ARTERIOVENOUS FISTULA Left 10/10/2015   Procedure: REVISON OF LEFT RADIOCEPHALIC ARTERIOVENOUS FISTULA;  Surgeon: Angelia Mould, MD;  Location: [place];  Service: Vascular;  Laterality: Left;   REVISON OF ARTERIOVENOUS FISTULA Left 02/07/2016   Procedure: REPAIR OF PSEUDO-ANEUREYSM OF LEFT ARM  ARTERIOVENOUS FISTULA;  Surgeon: Angelia Mould, MD;  Location: [place];  Service: Vascular;  Laterality: Left;   REVISON OF ARTERIOVENOUS FISTULA Left 12/15/7122   Procedure: PLICATION OF LEFT ARM RADIOCEPHALIC ARTERIOVENOUS FISTULA PSEUDOANEURYSM;  Surgeon: Angelia Mould, MD;  Location: [place];  Service: Vascular;  Laterality: Left;  REVISON OF ARTERIOVENOUS FISTULA Left 07/04/2018   Procedure: REVISION OF ARTERIOVENOUS RADIOCEPHALIC FISTULA;  Surgeon: Angelia Mould, MD;  Location: [place];  Service: Vascular;  Laterality: Left;   RIGHT/LEFT HEART CATH AND CORONARY ANGIOGRAPHY N/A 02/17/2017   Procedure: Right/Left Heart Cath and Coronary Angiography;  Surgeon: Jeffrey Mocha, MD;  Location: [place] CV LAB;  Service: Cardiovascular;  Laterality: N/A;   RIGHT/LEFT HEART CATH AND CORONARY ANGIOGRAPHY N/A 07/25/2018   Procedure: RIGHT/LEFT HEART CATH AND CORONARY ANGIOGRAPHY;  Surgeon: Leonie Man, MD;  Location: [place] CV [facility];  Service: Cardiovascular;  Laterality: N/A;   TEE WITHOUT CARDIOVERSION  N/A 09/11/2016   Procedure: TRANSESOPHAGEAL ECHOCARDIOGRAM (TEE);  Surgeon: Dorothy Spark, MD;  Location: [place];  Service: Cardiovascular;  Laterality: N/A;   TEE WITHOUT CARDIOVERSION N/A 05/05/2018   Procedure: TRANSESOPHAGEAL ECHOCARDIOGRAM (TEE);  Surgeon: Adrian Prows, MD;  Location: [place];  Service: Cardiovascular;  Laterality: N/A;  Prefer after 3:30 PM   TEE WITHOUT CARDIOVERSION N/A 07/28/2018   Procedure: TRANSESOPHAGEAL ECHOCARDIOGRAM (TEE);  Surgeon: [name], MD;  Location: [place];  Service: Open Heart Surgery;  Laterality: N/A;   VIDEO ASSISTED THORACOSCOPY Left 11/04/2018   Procedure: VIDEO ASSISTED THORACOSCOPY;  Surgeon: [name], MD;  Location: [facility];  Service: Thoracic;  Laterality: Left;   VIDEO ASSISTED THORACOSCOPY (VATS)/THOROCOTOMY Left 11/04/2018     Current Meds  Medication Sig   acetaminophen (TYLENOL) 325 MG tablet Take 2 tablets (650 mg total) by mouth every 6 (six) hours as needed for fever, headache, mild pain or moderate pain.   albuterol (PROVENTIL) (2.5 MG/3ML) 0.083% nebulizer solution Take 3 mLs (2.5 mg total) by nebulization every 6 (six) hours as needed for wheezing or shortness of breath.   Amino Acids-Protein Hydrolys (FEEDING SUPPLEMENT, PRO-STAT SUGAR FREE 64,) LIQD Take 30 mLs by mouth daily.   amLODipine (NORVASC) 10 MG tablet Take 1 tablet (10 mg total) by mouth daily.   aspirin EC 81 MG tablet Take 81 mg by mouth daily.   BIKTARVY 50-200-25 MG TABS tablet TAKE 1 TABLET BY MOUTH DAILY   cloNIDine (CATAPRES - DOSED IN MG/24 HR) 0.3 mg/24hr patch APPLY 1 PATCH(0.3 MG) EXTERNALLY TO THE SKIN 1 TIME A WEEK   Epoetin Alfa (EPOGEN IJ) Inject as directed. THREE TIMES A WEEK AT DIALYSIS   ferric gluconate 125 mg in sodium chloride 0.9 % 100 mL Inject 125 mg into the vein every Monday, Wednesday, and Friday with hemodialysis.   hydrALAZINE (APRESOLINE) 100 MG tablet Take 1 tablet (100 mg total) by mouth 3  (three) times daily.   iron sucrose (VENOFER) 20 MG/ML injection Inject 500 mg into the vein every 14 (fourteen) days.   isosorbide mononitrate (ISMO,MONOKET) 20 MG tablet Take 1 tablet (20 mg total) by mouth 2 (two) times daily at 10 AM and 5 PM.   Lacosamide (VIMPAT) 150 MG TABS Take 1 tablet (150 mg total) by mouth 2 (two) times daily.   lactulose (CHRONULAC) 10 GM/15ML solution Take 30 mLs (20 g total) by mouth daily as needed for moderate constipation.   losartan (COZAAR) 25 MG tablet Take 25 mg by mouth daily.   multivitamin (RENA-VIT) TABS tablet Take 1 tablet by mouth daily.   omeprazole (PRILOSEC) 20 MG capsule Take 1 capsule (20 mg total) by mouth daily.   traMADol (ULTRAM) 50 MG tablet Take 50 mg by mouth every 6 (six) hours as needed.   traZODone (DESYREL) 50 MG tablet TAKE 1 TABLET(50 MG)  BY MOUTH AT BEDTIME AS NEEDED FOR SLEEP   [DISCONTINUED] Amino Acids-Protein Hydrolys (FEEDING SUPPLEMENT, PRO-STAT SUGAR FREE 64,) LIQD Take 30 mLs by mouth 2 (two) times daily. (Patient taking differently: Take 30 mLs by mouth daily. )     Allergies:   Lisinopril; Penicillins; Fentanyl; and Morphine and related   Social History   Tobacco Use   Smoking status: Former Smoker    Years: 10.00    Types: Cigarettes    Last attempt to quit: 08/07/2012    Years since quitting: 6.5   Smokeless tobacco: Never Used   Tobacco comment: casual smoking for 10 years  Substance Use Topics   Alcohol use: No    Alcohol/week: 0.0 standard drinks    Comment: quit 2004   Drug use: No    Comment: uds (+) cocaine in 08/2011 but pt states was taking sudafed at the time     Family Hx: The patient's family history includes Cancer in his father; Diabetes in his mother; Hypertension in his mother.  ROS:   Please see the history of present illness.    All other systems reviewed and are negative.   Prior CV studies:   The following studies were reviewed today:  RIGHT/LEFT HEART CATH AND  CORONARY ANGIOGRAPHY 07/2018  Conclusion     Hemodynamic findings consistent with mild pulmonary hypertension.  LV end diastolic pressure is moderately elevated.  Normal RHC pressures with High CO/CI  Dist LM lesion is 40% stenosed.  Prox LAD lesion is 40% stenosed with 30% stenosed side branch in Ost 2nd Diag.   SUMMARY  Moderate single-vessel disease with extensive calcification.  Severe systemic hypertension -unresponsive to medications  Relatively normal right heart cath pressures.  High output by both Thermodilution and Fick.   RECOMMENDATIONS  Given his severe hypertension, he may require overnight observation for blood pressure management.  Have started IV nitroglycerin infusion.  Remove sheath once blood pressure safely below 150 mmHg.  Restart majority of home medications  If blood pressure stabilizes this evening could potentially discharge this evening but otherwise would monitor overnight.  Recommend Aspirin 81mg  daily for moderate CAD.      Labs/Other Tests and Data Reviewed:    EKG:  No ECG reviewed.  Recent Labs: 05/27/2018: B Natriuretic Peptide 1,135.9 07/10/2018: TSH 4.286 07/29/2018: Magnesium 1.7 11/06/2018: ALT 9 11/11/2018: BUN 36; Creatinine, Ser 7.06; Hemoglobin 7.8; Platelets 200; Potassium 4.3; Sodium 134   Recent Lipid Panel Lab Results  Component Value Date/Time   CHOL 98 07/14/2018 05:10 AM   TRIG 77 07/16/2018 09:44 PM   HDL 35 (L) 07/14/2018 05:10 AM   CHOLHDL 2.8 07/14/2018 05:10 AM   LDLCALC 49 07/14/2018 05:10 AM   LDLCALC 72 11/01/2017 10:08 AM    Wt Readings from Last 3 Encounters:  02/16/19 175 lb 0.7 oz (79.4 kg)  01/31/19 171 lb 1.2 oz (77.6 kg)  11/29/18 176 lb 9.4 oz (80.1 kg)     Objective:    Vital Signs:  BP (!) 146/78    Pulse 68    Temp 97.9 F (36.6 C)    Ht 6' (1.829 m)    Wt 175 lb 0.7 oz (79.4 kg)    SpO2 96%    BMI 23.74 kg/m    VITAL SIGNS:  reviewed GEN:  no acute distress EYES:  sclerae  anicteric, EOMI - Extraocular Movements Intact RESPIRATORY:  normal respiratory effort, symmetric expansion CARDIOVASCULAR:  no peripheral edema SKIN:  no rash, lesions or ulcers. MUSCULOSKELETAL:  no obvious deformities. NEURO:  alert and oriented x 3, no obvious focal deficit PSYCH:  normal affect  ASSESSMENT & PLAN:    1. Aortic valve endocarditis a/p aortic valve replacement - Asymptomatic. Continue current therapy.  2. HTN - Blood pressure has been improved since on dialysis. Managed by nephrologist.   3. ESRD - On home dialysis 4 times/week  4. Non obstructive CAD - No angina. Continue current medical therapy.   COVID-19 Education: The signs and symptoms of COVID-19 were discussed with the patient and how to seek care for testing (follow up with PCP or arrange E-visit).  The importance of social distancing was discussed today.  Time:   Today, I have spent 8 minutes with the patient with telehealth technology discussing the above problems.     Medication Adjustments/Labs and Tests Ordered: Current medicines are reviewed at length with the patient today.  Concerns regarding medicines are outlined above.   Tests Ordered: No orders of the defined types were placed in this encounter.   Medication Changes: No orders of the defined types were placed in this encounter.   Disposition:  Follow up in 6 month(s)  Signed, Leanor Kail, PA  02/16/2019 10:18 AM    [facility]

## 2019-02-15 NOTE — Telephone Encounter (Signed)
Have called dr hendrickson's office, spoke to his nurse she will check with [name] very soon

## 2019-02-16 ENCOUNTER — Other Ambulatory Visit: Payer: Self-pay

## 2019-02-16 ENCOUNTER — Encounter: Payer: Self-pay | Admitting: Physician Assistant

## 2019-02-16 ENCOUNTER — Telehealth (INDEPENDENT_AMBULATORY_CARE_PROVIDER_SITE_OTHER): Payer: Medicare Other | Admitting: Physician Assistant

## 2019-02-16 ENCOUNTER — Other Ambulatory Visit: Payer: Self-pay | Admitting: Infectious Diseases

## 2019-02-16 ENCOUNTER — Telehealth: Payer: Self-pay

## 2019-02-16 VITALS — BP 146/78 | HR 68 | Temp 97.9°F | Ht 72.0 in | Wt 175.0 lb

## 2019-02-16 DIAGNOSIS — N2581 Secondary hyperparathyroidism of renal origin: Secondary | ICD-10-CM | POA: Diagnosis not present

## 2019-02-16 DIAGNOSIS — N186 End stage renal disease: Secondary | ICD-10-CM

## 2019-02-16 DIAGNOSIS — G8918 Other acute postprocedural pain: Secondary | ICD-10-CM

## 2019-02-16 DIAGNOSIS — K3189 Other diseases of stomach and duodenum: Secondary | ICD-10-CM

## 2019-02-16 DIAGNOSIS — D631 Anemia in chronic kidney disease: Secondary | ICD-10-CM | POA: Diagnosis not present

## 2019-02-16 DIAGNOSIS — Z952 Presence of prosthetic heart valve: Secondary | ICD-10-CM

## 2019-02-16 DIAGNOSIS — D509 Iron deficiency anemia, unspecified: Secondary | ICD-10-CM | POA: Diagnosis not present

## 2019-02-16 DIAGNOSIS — I10 Essential (primary) hypertension: Secondary | ICD-10-CM

## 2019-02-16 DIAGNOSIS — Z992 Dependence on renal dialysis: Secondary | ICD-10-CM | POA: Diagnosis not present

## 2019-02-16 MED ORDER — TRAMADOL HCL 50 MG PO TABS: 50.0000 mg | ORAL_TABLET | Freq: Two times a day (BID) | ORAL | 0 refills | Status: AC | PRN

## 2019-02-16 NOTE — Telephone Encounter (Signed)
Patient's brother [name] called office today requesting refills for Tramadol. Informed [name] that we are unable to refill prescription since medication was not prescribed by our office. Advised that patient call his PCP for refill. [name] will reach out to PCP later today. [place]

## 2019-02-16 NOTE — Telephone Encounter (Signed)
traMADol (ULTRAM) 50 MG tablet   Refill request @  [id number] - [place], [place] [phone] (Phone) [phone] (Fax)    Pt would like this med by today. Please call pt back.

## 2019-02-16 NOTE — Patient Instructions (Signed)
Medication Instructions:  Your physician recommends that you continue on your current medications as directed. Please refer to the Current Medication list given to you today.  If you need a refill on your cardiac medications before your next appointment, please call your pharmacy.   Lab work: NONE If you have labs (blood work) drawn today and your tests are completely normal, you will receive your results only by: . MyChart Message (if you have MyChart) OR . A paper copy in the mail If you have any lab test that is abnormal or we need to change your treatment, we will call you to review the results.  Testing/Procedures: NONE  Follow-Up: At [facility], you and your health needs are our priority.  As part of our continuing mission to provide you with exceptional heart care, we have created designated Provider Care Teams.  These Care Teams include your primary Cardiologist (physician) and Advanced Practice Providers (APPs -  Physician Assistants and Nurse Practitioners) who all work together to provide you with the care you need, when you need it. You will need a follow up appointment in:  6 months.  Please call our office 2 months in advance to schedule this appointment.  You may see Michael Cooper, MD or one of the following Advanced Practice Providers on your designated Care Team: Scott Weaver, PA-C Vin Bhagat, PA-C . Janine Hammond, NP     

## 2019-02-16 NOTE — Telephone Encounter (Signed)
This will need to be filled by dr hendrickson"s office, I spoke to his nurse yesterday and was under the impression they would fill it.

## 2019-02-17 DIAGNOSIS — I2510 Atherosclerotic heart disease of native coronary artery without angina pectoris: Secondary | ICD-10-CM | POA: Diagnosis not present

## 2019-02-17 DIAGNOSIS — N186 End stage renal disease: Secondary | ICD-10-CM | POA: Diagnosis not present

## 2019-02-17 DIAGNOSIS — M47817 Spondylosis without myelopathy or radiculopathy, lumbosacral region: Secondary | ICD-10-CM | POA: Diagnosis not present

## 2019-02-17 DIAGNOSIS — D631 Anemia in chronic kidney disease: Secondary | ICD-10-CM | POA: Diagnosis not present

## 2019-02-17 DIAGNOSIS — J449 Chronic obstructive pulmonary disease, unspecified: Secondary | ICD-10-CM | POA: Diagnosis not present

## 2019-02-17 DIAGNOSIS — I120 Hypertensive chronic kidney disease with stage 5 chronic kidney disease or end stage renal disease: Secondary | ICD-10-CM | POA: Diagnosis not present

## 2019-02-17 DIAGNOSIS — Z992 Dependence on renal dialysis: Secondary | ICD-10-CM | POA: Diagnosis not present

## 2019-02-17 DIAGNOSIS — N2581 Secondary hyperparathyroidism of renal origin: Secondary | ICD-10-CM | POA: Diagnosis not present

## 2019-02-17 DIAGNOSIS — D509 Iron deficiency anemia, unspecified: Secondary | ICD-10-CM | POA: Diagnosis not present

## 2019-02-18 IMAGING — CR DG KNEE COMPLETE 4+V*L*
4 series · 4 of 4 positions shown · non-contrast
Comparison: None.

CLINICAL DATA: 61 y/o  M; left anterior knee pain.

EXAM:
LEFT KNEE - COMPLETE 4+ VIEW

[knee ap]
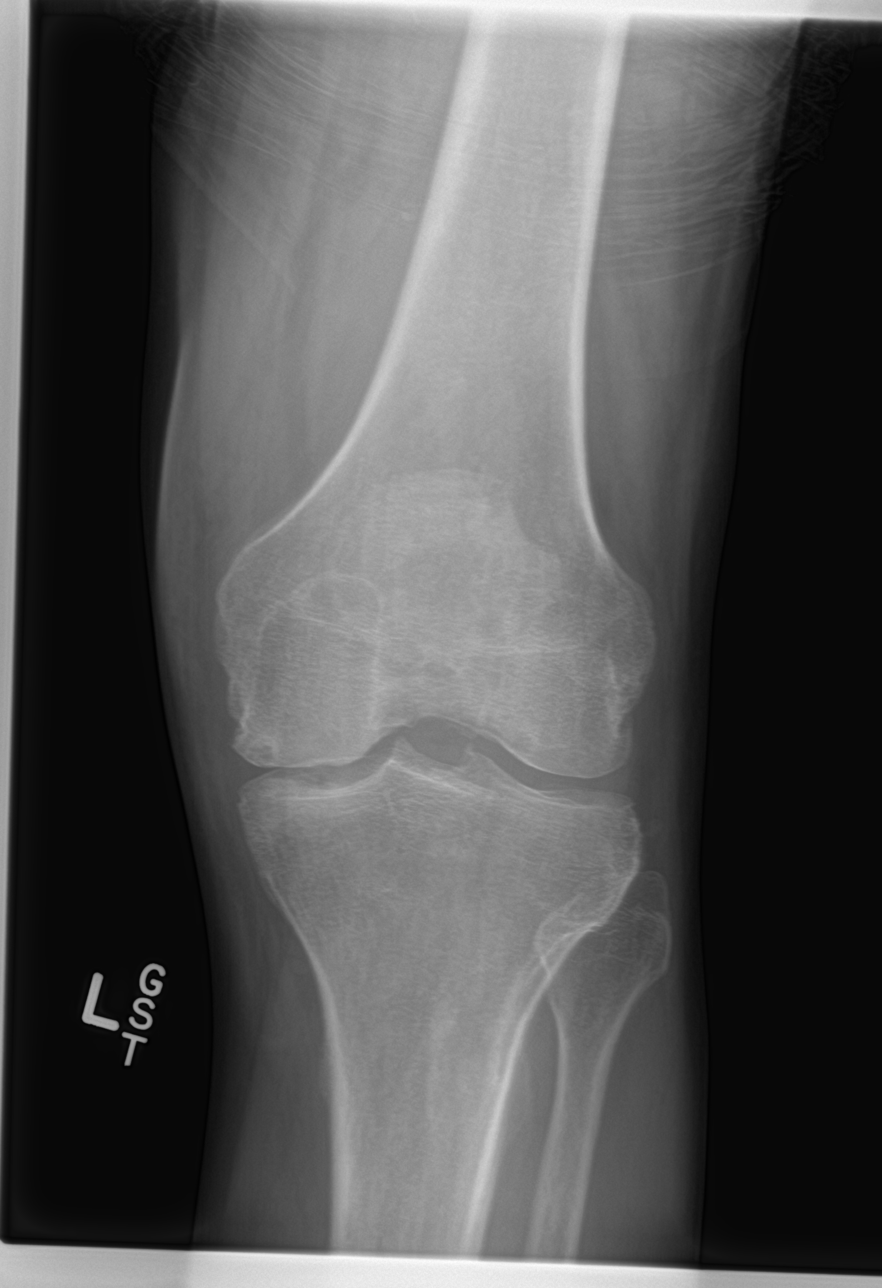

[knee lat]
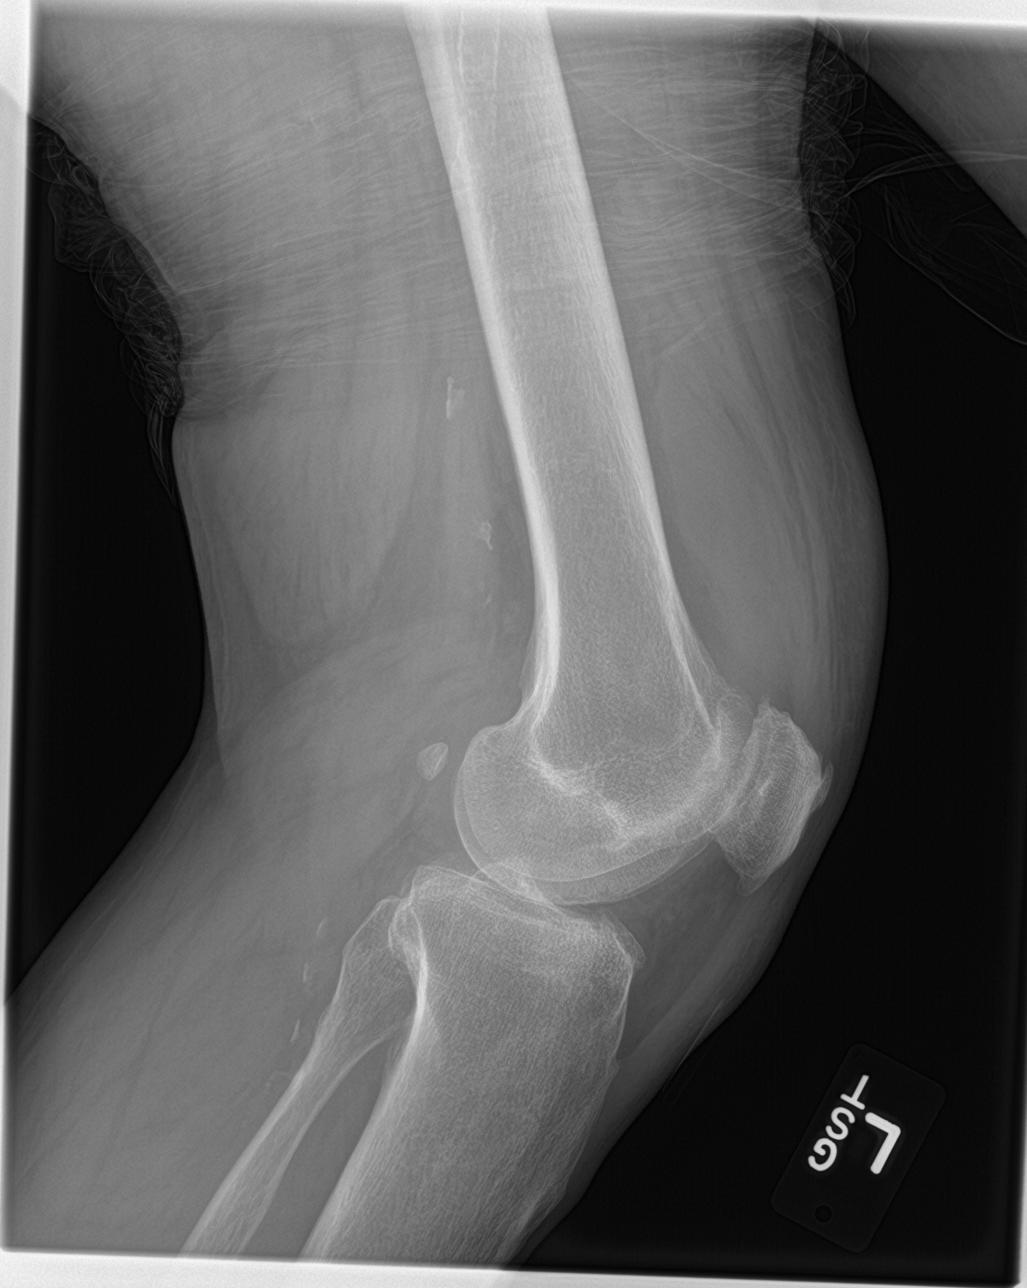

[knee obl (1 of 2)]
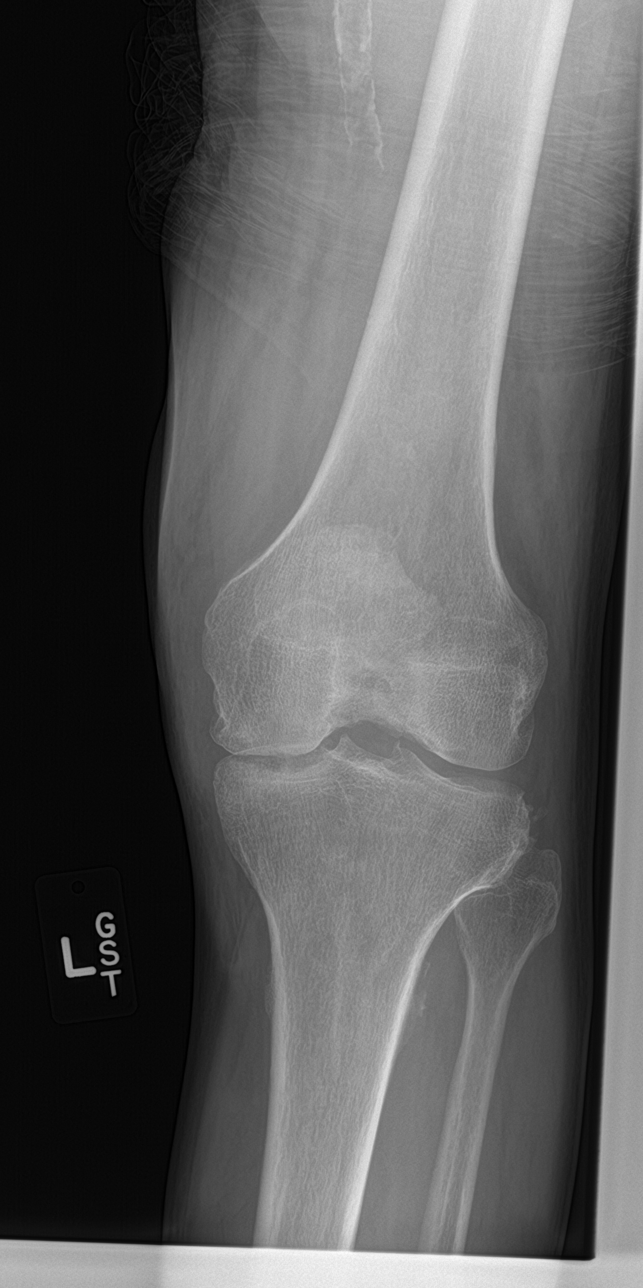

[knee obl (2 of 2)]
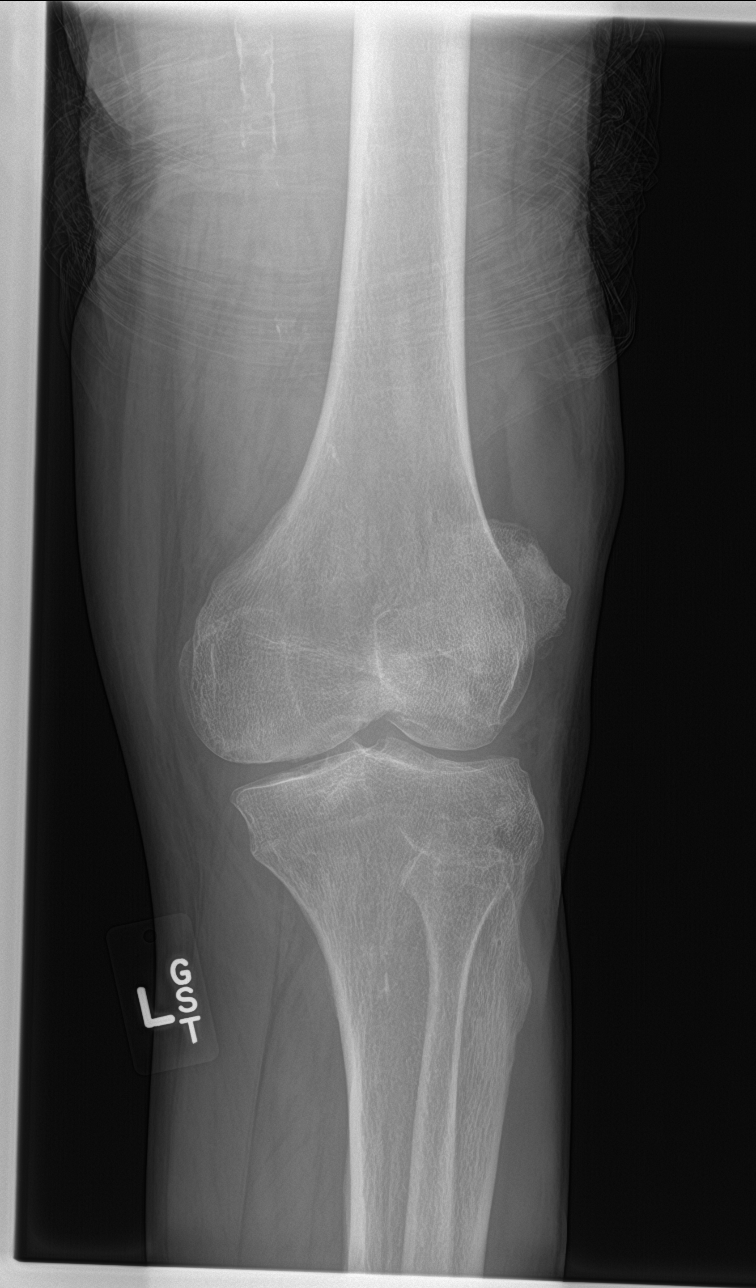

[4 of 4 positions shown; findings below may reference images not displayed]

FINDINGS: No acute fracture or dislocation identified. Tricompartmental
osteoarthrosis with mild medial femorotibial compartment joint space
narrowing and small tricompartmental osteophytes. Moderate
suprapatellar joint effusion. Vascular calcifications noted. Bones
are demineralized.
IMPRESSION: 1. Moderate joint effusion.
2. No acute fracture or dislocation identified.
3. Mild osteoarthrosis with medial femorotibial compartment joint
space narrowing and tricompartmental periarticular osteophytes.

By: Ourari Tiger M.D.

## 2019-02-19 DIAGNOSIS — N186 End stage renal disease: Secondary | ICD-10-CM | POA: Diagnosis not present

## 2019-02-19 DIAGNOSIS — Z992 Dependence on renal dialysis: Secondary | ICD-10-CM | POA: Diagnosis not present

## 2019-02-20 DIAGNOSIS — Z992 Dependence on renal dialysis: Secondary | ICD-10-CM | POA: Diagnosis not present

## 2019-02-20 DIAGNOSIS — D631 Anemia in chronic kidney disease: Secondary | ICD-10-CM | POA: Diagnosis not present

## 2019-02-20 DIAGNOSIS — D509 Iron deficiency anemia, unspecified: Secondary | ICD-10-CM | POA: Diagnosis not present

## 2019-02-20 DIAGNOSIS — N2581 Secondary hyperparathyroidism of renal origin: Secondary | ICD-10-CM | POA: Diagnosis not present

## 2019-02-20 DIAGNOSIS — N186 End stage renal disease: Secondary | ICD-10-CM | POA: Diagnosis not present

## 2019-02-21 DIAGNOSIS — D631 Anemia in chronic kidney disease: Secondary | ICD-10-CM | POA: Diagnosis not present

## 2019-02-21 DIAGNOSIS — N2581 Secondary hyperparathyroidism of renal origin: Secondary | ICD-10-CM | POA: Diagnosis not present

## 2019-02-21 DIAGNOSIS — Z992 Dependence on renal dialysis: Secondary | ICD-10-CM | POA: Diagnosis not present

## 2019-02-21 DIAGNOSIS — N186 End stage renal disease: Secondary | ICD-10-CM | POA: Diagnosis not present

## 2019-02-21 DIAGNOSIS — D509 Iron deficiency anemia, unspecified: Secondary | ICD-10-CM | POA: Diagnosis not present

## 2019-02-22 DIAGNOSIS — I2510 Atherosclerotic heart disease of native coronary artery without angina pectoris: Secondary | ICD-10-CM | POA: Diagnosis not present

## 2019-02-22 DIAGNOSIS — J449 Chronic obstructive pulmonary disease, unspecified: Secondary | ICD-10-CM | POA: Diagnosis not present

## 2019-02-22 DIAGNOSIS — N2581 Secondary hyperparathyroidism of renal origin: Secondary | ICD-10-CM | POA: Diagnosis not present

## 2019-02-22 DIAGNOSIS — Z992 Dependence on renal dialysis: Secondary | ICD-10-CM | POA: Diagnosis not present

## 2019-02-22 DIAGNOSIS — M47817 Spondylosis without myelopathy or radiculopathy, lumbosacral region: Secondary | ICD-10-CM | POA: Diagnosis not present

## 2019-02-22 DIAGNOSIS — N186 End stage renal disease: Secondary | ICD-10-CM | POA: Diagnosis not present

## 2019-02-22 DIAGNOSIS — D509 Iron deficiency anemia, unspecified: Secondary | ICD-10-CM | POA: Diagnosis not present

## 2019-02-22 DIAGNOSIS — D631 Anemia in chronic kidney disease: Secondary | ICD-10-CM | POA: Diagnosis not present

## 2019-02-22 DIAGNOSIS — I120 Hypertensive chronic kidney disease with stage 5 chronic kidney disease or end stage renal disease: Secondary | ICD-10-CM | POA: Diagnosis not present

## 2019-02-23 DIAGNOSIS — D631 Anemia in chronic kidney disease: Secondary | ICD-10-CM | POA: Diagnosis not present

## 2019-02-23 DIAGNOSIS — N186 End stage renal disease: Secondary | ICD-10-CM | POA: Diagnosis not present

## 2019-02-23 DIAGNOSIS — Z992 Dependence on renal dialysis: Secondary | ICD-10-CM | POA: Diagnosis not present

## 2019-02-23 DIAGNOSIS — D509 Iron deficiency anemia, unspecified: Secondary | ICD-10-CM | POA: Diagnosis not present

## 2019-02-23 DIAGNOSIS — N2581 Secondary hyperparathyroidism of renal origin: Secondary | ICD-10-CM | POA: Diagnosis not present

## 2019-02-24 DIAGNOSIS — N186 End stage renal disease: Secondary | ICD-10-CM | POA: Diagnosis not present

## 2019-02-24 DIAGNOSIS — J449 Chronic obstructive pulmonary disease, unspecified: Secondary | ICD-10-CM | POA: Diagnosis not present

## 2019-02-24 DIAGNOSIS — M47817 Spondylosis without myelopathy or radiculopathy, lumbosacral region: Secondary | ICD-10-CM | POA: Diagnosis not present

## 2019-02-24 DIAGNOSIS — D509 Iron deficiency anemia, unspecified: Secondary | ICD-10-CM | POA: Diagnosis not present

## 2019-02-24 DIAGNOSIS — D631 Anemia in chronic kidney disease: Secondary | ICD-10-CM | POA: Diagnosis not present

## 2019-02-24 DIAGNOSIS — I120 Hypertensive chronic kidney disease with stage 5 chronic kidney disease or end stage renal disease: Secondary | ICD-10-CM | POA: Diagnosis not present

## 2019-02-24 DIAGNOSIS — Z992 Dependence on renal dialysis: Secondary | ICD-10-CM | POA: Diagnosis not present

## 2019-02-24 DIAGNOSIS — N2581 Secondary hyperparathyroidism of renal origin: Secondary | ICD-10-CM | POA: Diagnosis not present

## 2019-02-24 DIAGNOSIS — I2510 Atherosclerotic heart disease of native coronary artery without angina pectoris: Secondary | ICD-10-CM | POA: Diagnosis not present

## 2019-02-27 DIAGNOSIS — D509 Iron deficiency anemia, unspecified: Secondary | ICD-10-CM | POA: Diagnosis not present

## 2019-02-27 DIAGNOSIS — I120 Hypertensive chronic kidney disease with stage 5 chronic kidney disease or end stage renal disease: Secondary | ICD-10-CM | POA: Diagnosis not present

## 2019-02-27 DIAGNOSIS — I2510 Atherosclerotic heart disease of native coronary artery without angina pectoris: Secondary | ICD-10-CM | POA: Diagnosis not present

## 2019-02-27 DIAGNOSIS — J449 Chronic obstructive pulmonary disease, unspecified: Secondary | ICD-10-CM | POA: Diagnosis not present

## 2019-02-27 DIAGNOSIS — M47817 Spondylosis without myelopathy or radiculopathy, lumbosacral region: Secondary | ICD-10-CM | POA: Diagnosis not present

## 2019-02-27 DIAGNOSIS — N2581 Secondary hyperparathyroidism of renal origin: Secondary | ICD-10-CM | POA: Diagnosis not present

## 2019-02-27 DIAGNOSIS — N186 End stage renal disease: Secondary | ICD-10-CM | POA: Diagnosis not present

## 2019-02-27 DIAGNOSIS — D631 Anemia in chronic kidney disease: Secondary | ICD-10-CM | POA: Diagnosis not present

## 2019-02-27 DIAGNOSIS — Z992 Dependence on renal dialysis: Secondary | ICD-10-CM | POA: Diagnosis not present

## 2019-02-28 ENCOUNTER — Other Ambulatory Visit: Payer: Self-pay

## 2019-02-28 ENCOUNTER — Ambulatory Visit (INDEPENDENT_AMBULATORY_CARE_PROVIDER_SITE_OTHER): Payer: Medicare Other | Admitting: Podiatry

## 2019-02-28 ENCOUNTER — Encounter: Payer: Self-pay | Admitting: Podiatry

## 2019-02-28 VITALS — Temp 97.5°F

## 2019-02-28 DIAGNOSIS — D631 Anemia in chronic kidney disease: Secondary | ICD-10-CM | POA: Diagnosis not present

## 2019-02-28 DIAGNOSIS — M79675 Pain in left toe(s): Secondary | ICD-10-CM | POA: Diagnosis not present

## 2019-02-28 DIAGNOSIS — B351 Tinea unguium: Secondary | ICD-10-CM | POA: Diagnosis not present

## 2019-02-28 DIAGNOSIS — Z992 Dependence on renal dialysis: Secondary | ICD-10-CM | POA: Diagnosis not present

## 2019-02-28 DIAGNOSIS — N2581 Secondary hyperparathyroidism of renal origin: Secondary | ICD-10-CM | POA: Diagnosis not present

## 2019-02-28 DIAGNOSIS — M79674 Pain in right toe(s): Secondary | ICD-10-CM | POA: Diagnosis not present

## 2019-02-28 DIAGNOSIS — N186 End stage renal disease: Secondary | ICD-10-CM | POA: Diagnosis not present

## 2019-02-28 DIAGNOSIS — D509 Iron deficiency anemia, unspecified: Secondary | ICD-10-CM | POA: Diagnosis not present

## 2019-02-28 NOTE — Patient Instructions (Signed)

## 2019-03-01 DIAGNOSIS — M47817 Spondylosis without myelopathy or radiculopathy, lumbosacral region: Secondary | ICD-10-CM | POA: Diagnosis not present

## 2019-03-01 DIAGNOSIS — I2510 Atherosclerotic heart disease of native coronary artery without angina pectoris: Secondary | ICD-10-CM | POA: Diagnosis not present

## 2019-03-01 DIAGNOSIS — I120 Hypertensive chronic kidney disease with stage 5 chronic kidney disease or end stage renal disease: Secondary | ICD-10-CM | POA: Diagnosis not present

## 2019-03-01 DIAGNOSIS — D631 Anemia in chronic kidney disease: Secondary | ICD-10-CM | POA: Diagnosis not present

## 2019-03-01 DIAGNOSIS — N186 End stage renal disease: Secondary | ICD-10-CM | POA: Diagnosis not present

## 2019-03-01 DIAGNOSIS — J449 Chronic obstructive pulmonary disease, unspecified: Secondary | ICD-10-CM | POA: Diagnosis not present

## 2019-03-01 MED ORDER — NONFORMULARY OR COMPOUNDED ITEM: 5 refills | Status: AC

## 2019-03-02 DIAGNOSIS — Z992 Dependence on renal dialysis: Secondary | ICD-10-CM | POA: Diagnosis not present

## 2019-03-02 DIAGNOSIS — N186 End stage renal disease: Secondary | ICD-10-CM | POA: Diagnosis not present

## 2019-03-02 DIAGNOSIS — D631 Anemia in chronic kidney disease: Secondary | ICD-10-CM | POA: Diagnosis not present

## 2019-03-02 DIAGNOSIS — D509 Iron deficiency anemia, unspecified: Secondary | ICD-10-CM | POA: Diagnosis not present

## 2019-03-02 DIAGNOSIS — N2581 Secondary hyperparathyroidism of renal origin: Secondary | ICD-10-CM | POA: Diagnosis not present

## 2019-03-03 DIAGNOSIS — N2581 Secondary hyperparathyroidism of renal origin: Secondary | ICD-10-CM | POA: Diagnosis not present

## 2019-03-03 DIAGNOSIS — N186 End stage renal disease: Secondary | ICD-10-CM | POA: Diagnosis not present

## 2019-03-03 DIAGNOSIS — D631 Anemia in chronic kidney disease: Secondary | ICD-10-CM | POA: Diagnosis not present

## 2019-03-03 DIAGNOSIS — D509 Iron deficiency anemia, unspecified: Secondary | ICD-10-CM | POA: Diagnosis not present

## 2019-03-03 DIAGNOSIS — Z992 Dependence on renal dialysis: Secondary | ICD-10-CM | POA: Diagnosis not present

## 2019-03-06 DIAGNOSIS — N186 End stage renal disease: Secondary | ICD-10-CM | POA: Diagnosis not present

## 2019-03-06 DIAGNOSIS — D509 Iron deficiency anemia, unspecified: Secondary | ICD-10-CM | POA: Diagnosis not present

## 2019-03-06 DIAGNOSIS — N2581 Secondary hyperparathyroidism of renal origin: Secondary | ICD-10-CM | POA: Diagnosis not present

## 2019-03-06 DIAGNOSIS — D631 Anemia in chronic kidney disease: Secondary | ICD-10-CM | POA: Diagnosis not present

## 2019-03-06 DIAGNOSIS — Z992 Dependence on renal dialysis: Secondary | ICD-10-CM | POA: Diagnosis not present

## 2019-03-07 DIAGNOSIS — N186 End stage renal disease: Secondary | ICD-10-CM | POA: Diagnosis not present

## 2019-03-07 DIAGNOSIS — D509 Iron deficiency anemia, unspecified: Secondary | ICD-10-CM | POA: Diagnosis not present

## 2019-03-07 DIAGNOSIS — N2581 Secondary hyperparathyroidism of renal origin: Secondary | ICD-10-CM | POA: Diagnosis not present

## 2019-03-07 DIAGNOSIS — D631 Anemia in chronic kidney disease: Secondary | ICD-10-CM | POA: Diagnosis not present

## 2019-03-07 DIAGNOSIS — Z992 Dependence on renal dialysis: Secondary | ICD-10-CM | POA: Diagnosis not present

## 2019-03-09 DIAGNOSIS — Z992 Dependence on renal dialysis: Secondary | ICD-10-CM | POA: Diagnosis not present

## 2019-03-09 DIAGNOSIS — D509 Iron deficiency anemia, unspecified: Secondary | ICD-10-CM | POA: Diagnosis not present

## 2019-03-09 DIAGNOSIS — D631 Anemia in chronic kidney disease: Secondary | ICD-10-CM | POA: Diagnosis not present

## 2019-03-09 DIAGNOSIS — N2581 Secondary hyperparathyroidism of renal origin: Secondary | ICD-10-CM | POA: Diagnosis not present

## 2019-03-09 DIAGNOSIS — N186 End stage renal disease: Secondary | ICD-10-CM | POA: Diagnosis not present

## 2019-03-09 NOTE — Progress Notes (Signed)
Subjective: Jeffrey [name] presents today with painful, thick toenails 1-5 b/l that he cannot cut and which interfere with daily activities.  Pain is aggravated when wearing enclosed shoe gear.  Today, he is requesting medication for his fungal toenails.  Patient is also on in-home dialysis.  Velna Ochs, MD is his PCP.  Medications reviewed.  Allergies  Allergen Reactions  . Lisinopril Anaphylaxis and Shortness Of Breath    Throat swelling  . Penicillins Anaphylaxis and Other (See Comments)    Childhood allergy Has patient had a PCN reaction causing immediate rash, facial/tongue/throat swelling, SOB or lightheadedness with hypotension: Yes Has patient had a PCN reaction causing severe rash involving mucus membranes or skin necrosis: Unk Has patient had a PCN reaction that required hospitalization: Unk Has patient had a PCN reaction occurring within the last 10 years: No If all of the above answers are "NO", then may proceed with Cephalosporin use.   . Fentanyl Other (See Comments)    Lethargy, AMS  . Morphine And Related Other (See Comments)    "Not Himself"    Objective: Vitals:   02/28/19 1207  Temp: (!) 97.5 F (36.4 C)    Vascular Examination: Capillary refill time immediate x 10 digits.  Dorsalis pedis and Posterior tibial pulses palpable b/l.  Digital hair present x 10 digits.  Skin temperature gradient WNL b/l.  Dermatological Examination: Skin with normal turgor, texture and tone b/l.  Toenails 1-5 b/l discolored, thick, dystrophic with subungual debris and pain with palpation to nailbeds due to thickness of nails.  Musculoskeletal: Muscle strength 5/5 to all LE muscle groups.  No gross bony deformities b/l.  No pain, crepitus or joint limitation noted with ROM.   Neurological: Sensation intact with 10 gram monofilament.  Vibratory sensation intact.  Assessment: Painful onychomycosis toenails 1-5 b/l   Plan: 1. Toenails 1-5 b/l were  debrided in length and girth without iatrogenic bleeding.  Rx written for nonformulary compounding topical antifungal: [facility]: Antifungal cream - Terbinafine 3%, Fluconazole 2%, Tea Tree Oil 5%, Urea 10%, Ibuprofen 2% in DMSO Suspension #103ml. Apply to the affected nail(s) at bedtime. 2. Patient to continue soft, supportive shoe gear daily. 3. Patient to report any pedal injuries to medical professional immediately. 4. Follow up 3 months.  5. Patient/POA to call should there be a concern in the interim.

## 2019-03-10 ENCOUNTER — Encounter: Payer: Self-pay | Admitting: Internal Medicine

## 2019-03-10 ENCOUNTER — Other Ambulatory Visit: Payer: Self-pay

## 2019-03-10 ENCOUNTER — Ambulatory Visit (INDEPENDENT_AMBULATORY_CARE_PROVIDER_SITE_OTHER): Payer: Medicare Other | Admitting: Internal Medicine

## 2019-03-10 VITALS — BP 172/90 | HR 78 | Temp 98.4°F | Ht 72.0 in | Wt 191.6 lb

## 2019-03-10 DIAGNOSIS — Z7982 Long term (current) use of aspirin: Secondary | ICD-10-CM

## 2019-03-10 DIAGNOSIS — I120 Hypertensive chronic kidney disease with stage 5 chronic kidney disease or end stage renal disease: Secondary | ICD-10-CM

## 2019-03-10 DIAGNOSIS — M25512 Pain in left shoulder: Secondary | ICD-10-CM | POA: Diagnosis not present

## 2019-03-10 DIAGNOSIS — Z992 Dependence on renal dialysis: Secondary | ICD-10-CM

## 2019-03-10 DIAGNOSIS — M47817 Spondylosis without myelopathy or radiculopathy, lumbosacral region: Secondary | ICD-10-CM | POA: Diagnosis not present

## 2019-03-10 DIAGNOSIS — I10 Essential (primary) hypertension: Secondary | ICD-10-CM

## 2019-03-10 DIAGNOSIS — N2581 Secondary hyperparathyroidism of renal origin: Secondary | ICD-10-CM | POA: Diagnosis not present

## 2019-03-10 DIAGNOSIS — Z79899 Other long term (current) drug therapy: Secondary | ICD-10-CM

## 2019-03-10 DIAGNOSIS — Z87891 Personal history of nicotine dependence: Secondary | ICD-10-CM

## 2019-03-10 DIAGNOSIS — G8921 Chronic pain due to trauma: Secondary | ICD-10-CM | POA: Diagnosis not present

## 2019-03-10 DIAGNOSIS — N186 End stage renal disease: Secondary | ICD-10-CM | POA: Diagnosis not present

## 2019-03-10 DIAGNOSIS — D509 Iron deficiency anemia, unspecified: Secondary | ICD-10-CM | POA: Diagnosis not present

## 2019-03-10 DIAGNOSIS — D631 Anemia in chronic kidney disease: Secondary | ICD-10-CM | POA: Diagnosis not present

## 2019-03-10 DIAGNOSIS — I2510 Atherosclerotic heart disease of native coronary artery without angina pectoris: Secondary | ICD-10-CM | POA: Diagnosis not present

## 2019-03-10 DIAGNOSIS — G8929 Other chronic pain: Secondary | ICD-10-CM

## 2019-03-10 DIAGNOSIS — J449 Chronic obstructive pulmonary disease, unspecified: Secondary | ICD-10-CM | POA: Diagnosis not present

## 2019-03-10 MED ORDER — DICLOFENAC SODIUM 1 % TD GEL: 2.0000 g | Freq: Four times a day (QID) | TRANSDERMAL | 1 refills | Status: AC | PRN

## 2019-03-10 NOTE — Progress Notes (Signed)
   CC: Left Shoulder Pain  HPI:   Mr.Horacio WOODS GANGEMI is a 63 y.o. M with PMHx listed below presenting for Left Shoulder Pain. Please see the A&P for the status of the patient's chronic medical problems.  Past Medical History:  Diagnosis Date  . Anemia   . Anxiety   . Aortic valve stenosis   . Arthritis   . Asthma    per pt hx  . Dyspnea   . End stage renal disease on home HD 07/10/2011   Started HD in September 2012 at [facility] with a tunneled HD catheter, now on home HD with NxtStage. Dialyzing through AVF L lower arm with buttonhole technique as of mid 2014. His brother does the HD treatments at home.  They are roommates for 23 years.  The brother works 3rd shift and gets off about 8am and then puts Mr Thune on HD in the morning after getting home. Most of the time he does HD about 4 times a week, for about 4 hours per treatment. Cause of ESRD was HTN according to patient. He says he let his health go and ending up with complications, and that he didn't like seeing doctors in those days.  He says he was diagnosed with severe HTN when he lived in [place] in his 89's.   . Hepatitis B carrier ([place])   . HIV infection ([place])   . Hypertension   . Hyperthyroidism    normal now  . Pneumonia several yrs ago  . Seizures ([place]) 06/02/2011   had a mild one in Sept. 2019  . Sleep apnea    to start Cpap soon  . Thrombocytopenia ([place])    Review of Systems:  Performed and all others negative.  Physical Exam:  Vitals:   03/10/19 1012  Pulse: 78  Temp: 98.4 F (36.9 C)  TempSrc: Oral  SpO2: 100%  Weight: 191 lb 9.6 oz (86.9 kg)  Height: 6' (1.829 m)   Physical Exam Constitutional:      General: He is not in acute distress.    Appearance: Normal appearance.  Cardiovascular:     Rate and Rhythm: Normal rate and regular rhythm.     Pulses: Normal pulses.     Heart sounds: Normal heart sounds.  Pulmonary:     Effort: Pulmonary effort is normal. No respiratory distress.     Breath sounds:  Normal breath sounds.  Abdominal:     General: Bowel sounds are normal. There is no distension.     Palpations: Abdomen is soft.     Tenderness: There is no abdominal tenderness.  Musculoskeletal:        General: No swelling or deformity.     Comments: L Shoulder: No bruising. No swelling. Patient is sitting holding left arm with his right hand L shoulder Diffusely TTP ROM limited by pain to 20-30 degrees of flexion and abduction by pain. Able to flex to 90 degrees with passive range of motion. Pain with IR and ER. Unable to evaluate with special tests due to pain with all tests.  Skin:    General: Skin is warm and dry.  Neurological:     General: No focal deficit present.     Mental Status: Mental status is at baseline.    Assessment & Plan:   See Encounters Tab for problem based charting.  Patient discussed with Dr. Lynnae January

## 2019-03-10 NOTE — Assessment & Plan Note (Signed)
BP elevated today in the setting of left shoulder pain. He has been taking all of his medications per him and his brother. BP is managed in conjunction with nephrology as patient is ESRD on HD. Will defer medication changes at this time and patient will be reevaluated at next PCP visit at which time pain will hopefully be improved. - Continue current regimen

## 2019-03-10 NOTE — Patient Instructions (Addendum)
Thank you for allowing [name] to care for you  Your symptoms are consistent with a flair of pain from your arthritis - Call sports medicine to schedule and appointment for evaluation and possible injection - [place]: [phone] - Voltaren gel refilled today  We will continue to monitor your blood pressure along with nephrology - Changes may be considered if elevated at follow up when pain has resolved   Make appointment with PCP in the next 2-3 months

## 2019-03-10 NOTE — Assessment & Plan Note (Signed)
Patient presents today for evaluation of left shoulder pain. He has had significant L shoulder pain since a fall in January. The pain is very similar to his prior L shoulder pain following a fall last August. At that time he was found to have sever OA of his left shoulder and his pain improved with steroid injection by sports medicine.   His current pain is described as a waxing an waning dull pain. The pain is 8/10 at it's worst and does at times resolve entirely if he is not using the arm. Pain is triggered by any movement. He is diffusely tender to palpation. Exam limited by pain with >30 degrees of flexion or abduction. He has had xray's since the fall, none of which show acute boney abnormality, but do show signs of his known OA.  Will treat with PRN Voltaren as this has helped some (he had some left over from a prior prescription). He will likely need another injection to alleviate his pain. He was given the options of returning to our clinic another day for injection or returning to sports medicine as he was seen by them previously. He and his brother decided to return to sports medicine. They were advised to call and schedule an appointment as it had not bee too long since his last visit and a referral should not be needed. Phone number for the sports medicine clinic was provided. - Voltaren gel PRN - F/U with sports medicine

## 2019-03-11 IMAGING — XA IR US GUIDE VASC ACCESS RIGHT
1 series · 1 of 1 positions shown · non-contrast
Comparison: none

INDICATION: Poor venous access. In need of durable intravenous access for
antibiotic administration. History of end-stage renal disease,
currently on dialysis.

[Series 300: line placements · 1 of 1 slices shown]
[im 1/1]
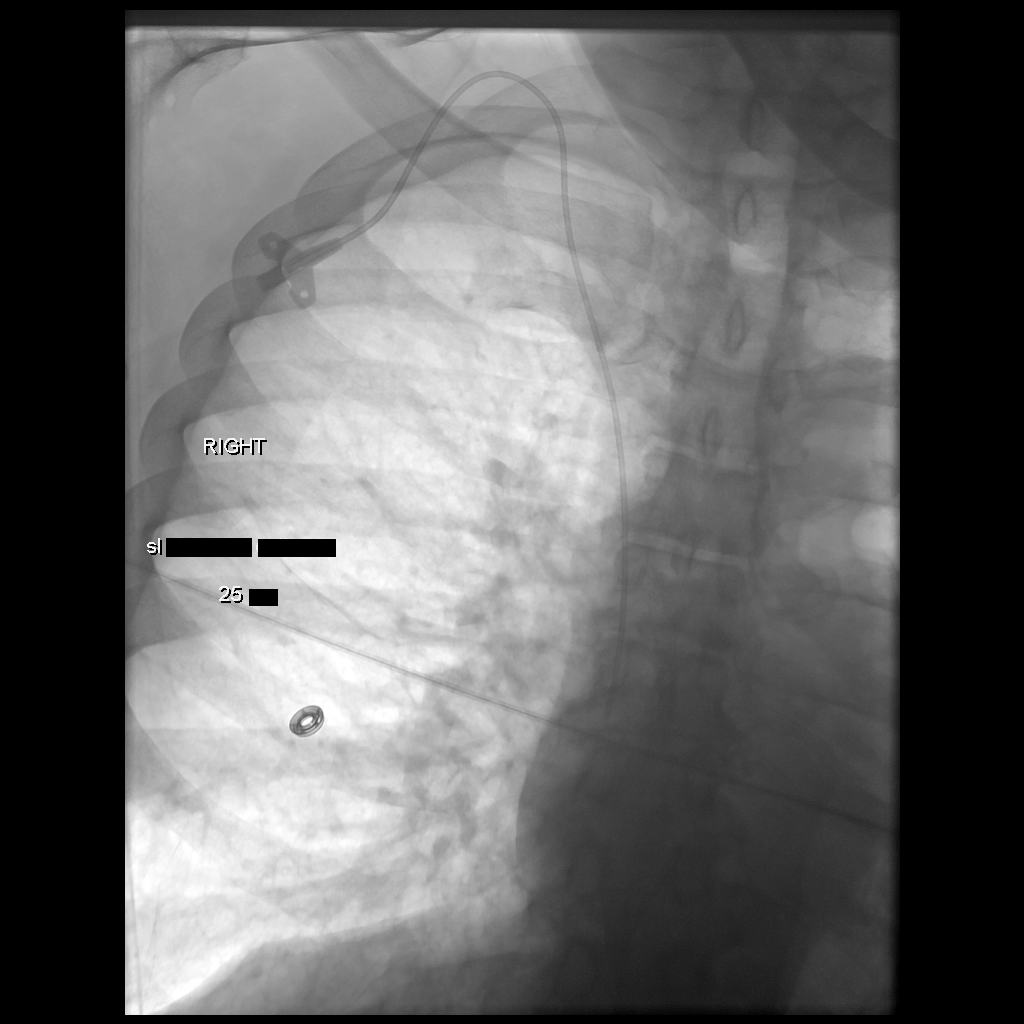

[1 of 1 positions shown; findings below may reference images not displayed]

As such, request made for placement of a tunneled single-lumen PICC
line.

EXAM:
TUNNELED PICC LINE WITH ULTRASOUND AND FLUOROSCOPIC GUIDANCE

MEDICATIONS:
Patient is currently admitted to the hospital and receiving
intravenous antibiotics. The antibiotic was given in an appropriate
time interval prior to skin puncture.

ANESTHESIA/SEDATION:
None

FLUOROSCOPY TIME:  18 seconds (12 mGy)

COMPLICATIONS:
None immediate.



After the overlying soft tissues were anesthetized, a small venotomy
incision was created and a micropuncture kit was utilized to access
the internal jugular vein. Real-time ultrasound guidance was
utilized for vascular access including the acquisition of a
permanent ultrasound image documenting patency of the accessed
vessel. The microwire was utilized to measure appropriate catheter
length. The micropuncture sheath was exchanged for a peel-away
sheath over a guidewire. A 5 French single lumen tunneled PICC
measuring 25 cm was tunneled in a retrograde fashion from the
anterior chest wall to the venotomy incision.

The catheter was then placed through the peel-away sheath with tip
ultimately positioned at the superior caval-atrial junction. Final
catheter positioning was confirmed and documented with a spot
radiographic image. The catheter aspirates and flushes normally. The
catheter was flushed with appropriate volume heparin dwells.

The catheter exit site was secured with a 0-Prolene retention
suture. The venotomy incision was closed with Dermabond and
Bakheet. Dressings were applied. The patient tolerated the
procedure well without immediate post procedural complication.
FINDINGS: After catheter placement, the tip lies within the superior
cavoatrial junction. The catheter aspirates and flushes normally and
is ready for immediate use.
IMPRESSION: Successful placement of 25 cm single lumen tunneled PICC catheter
via the right internal jugular vein with tip terminating at the
superior caval atrial junction. The catheter is ready for immediate
use.

## 2019-03-13 DIAGNOSIS — M47817 Spondylosis without myelopathy or radiculopathy, lumbosacral region: Secondary | ICD-10-CM | POA: Diagnosis not present

## 2019-03-13 DIAGNOSIS — N186 End stage renal disease: Secondary | ICD-10-CM | POA: Diagnosis not present

## 2019-03-13 DIAGNOSIS — I120 Hypertensive chronic kidney disease with stage 5 chronic kidney disease or end stage renal disease: Secondary | ICD-10-CM | POA: Diagnosis not present

## 2019-03-13 DIAGNOSIS — D509 Iron deficiency anemia, unspecified: Secondary | ICD-10-CM | POA: Diagnosis not present

## 2019-03-13 DIAGNOSIS — J449 Chronic obstructive pulmonary disease, unspecified: Secondary | ICD-10-CM | POA: Diagnosis not present

## 2019-03-13 DIAGNOSIS — D631 Anemia in chronic kidney disease: Secondary | ICD-10-CM | POA: Diagnosis not present

## 2019-03-13 DIAGNOSIS — I2510 Atherosclerotic heart disease of native coronary artery without angina pectoris: Secondary | ICD-10-CM | POA: Diagnosis not present

## 2019-03-13 DIAGNOSIS — N2581 Secondary hyperparathyroidism of renal origin: Secondary | ICD-10-CM | POA: Diagnosis not present

## 2019-03-13 DIAGNOSIS — Z992 Dependence on renal dialysis: Secondary | ICD-10-CM | POA: Diagnosis not present

## 2019-03-14 DIAGNOSIS — D509 Iron deficiency anemia, unspecified: Secondary | ICD-10-CM | POA: Diagnosis not present

## 2019-03-14 DIAGNOSIS — N186 End stage renal disease: Secondary | ICD-10-CM | POA: Diagnosis not present

## 2019-03-14 DIAGNOSIS — N2581 Secondary hyperparathyroidism of renal origin: Secondary | ICD-10-CM | POA: Diagnosis not present

## 2019-03-14 DIAGNOSIS — Z992 Dependence on renal dialysis: Secondary | ICD-10-CM | POA: Diagnosis not present

## 2019-03-14 DIAGNOSIS — D631 Anemia in chronic kidney disease: Secondary | ICD-10-CM | POA: Diagnosis not present

## 2019-03-15 DIAGNOSIS — Z9181 History of falling: Secondary | ICD-10-CM | POA: Diagnosis not present

## 2019-03-15 DIAGNOSIS — H2510 Age-related nuclear cataract, unspecified eye: Secondary | ICD-10-CM | POA: Diagnosis not present

## 2019-03-15 DIAGNOSIS — M47817 Spondylosis without myelopathy or radiculopathy, lumbosacral region: Secondary | ICD-10-CM | POA: Diagnosis not present

## 2019-03-15 DIAGNOSIS — I2510 Atherosclerotic heart disease of native coronary artery without angina pectoris: Secondary | ICD-10-CM | POA: Diagnosis not present

## 2019-03-15 DIAGNOSIS — D631 Anemia in chronic kidney disease: Secondary | ICD-10-CM | POA: Diagnosis not present

## 2019-03-15 DIAGNOSIS — F418 Other specified anxiety disorders: Secondary | ICD-10-CM | POA: Diagnosis not present

## 2019-03-15 DIAGNOSIS — N186 End stage renal disease: Secondary | ICD-10-CM | POA: Diagnosis not present

## 2019-03-15 DIAGNOSIS — H5213 Myopia, bilateral: Secondary | ICD-10-CM | POA: Diagnosis not present

## 2019-03-15 DIAGNOSIS — E440 Moderate protein-calorie malnutrition: Secondary | ICD-10-CM | POA: Diagnosis not present

## 2019-03-15 DIAGNOSIS — I120 Hypertensive chronic kidney disease with stage 5 chronic kidney disease or end stage renal disease: Secondary | ICD-10-CM | POA: Diagnosis not present

## 2019-03-15 DIAGNOSIS — B20 Human immunodeficiency virus [HIV] disease: Secondary | ICD-10-CM | POA: Diagnosis not present

## 2019-03-15 DIAGNOSIS — Z952 Presence of prosthetic heart valve: Secondary | ICD-10-CM | POA: Diagnosis not present

## 2019-03-15 DIAGNOSIS — Z7982 Long term (current) use of aspirin: Secondary | ICD-10-CM | POA: Diagnosis not present

## 2019-03-15 DIAGNOSIS — J449 Chronic obstructive pulmonary disease, unspecified: Secondary | ICD-10-CM | POA: Diagnosis not present

## 2019-03-15 DIAGNOSIS — D696 Thrombocytopenia, unspecified: Secondary | ICD-10-CM | POA: Diagnosis not present

## 2019-03-15 DIAGNOSIS — G4733 Obstructive sleep apnea (adult) (pediatric): Secondary | ICD-10-CM | POA: Diagnosis not present

## 2019-03-15 DIAGNOSIS — I340 Nonrheumatic mitral (valve) insufficiency: Secondary | ICD-10-CM | POA: Diagnosis not present

## 2019-03-15 DIAGNOSIS — G40909 Epilepsy, unspecified, not intractable, without status epilepticus: Secondary | ICD-10-CM | POA: Diagnosis not present

## 2019-03-15 NOTE — Progress Notes (Signed)
Internal Medicine Clinic Attending  Case discussed with Dr. Melvin  at the time of the visit.  We reviewed the resident's history and exam and pertinent patient test results.  I agree with the assessment, diagnosis, and plan of care documented in the resident's note.  

## 2019-03-16 ENCOUNTER — Ambulatory Visit: Payer: Self-pay

## 2019-03-16 ENCOUNTER — Encounter: Payer: Self-pay | Admitting: Family Medicine

## 2019-03-16 ENCOUNTER — Other Ambulatory Visit: Payer: Self-pay

## 2019-03-16 ENCOUNTER — Ambulatory Visit (INDEPENDENT_AMBULATORY_CARE_PROVIDER_SITE_OTHER): Payer: Medicare Other | Admitting: Family Medicine

## 2019-03-16 VITALS — BP 144/80 | Ht 72.0 in | Wt 191.0 lb

## 2019-03-16 DIAGNOSIS — M19012 Primary osteoarthritis, left shoulder: Secondary | ICD-10-CM | POA: Diagnosis not present

## 2019-03-16 DIAGNOSIS — M25512 Pain in left shoulder: Secondary | ICD-10-CM | POA: Diagnosis not present

## 2019-03-16 DIAGNOSIS — D509 Iron deficiency anemia, unspecified: Secondary | ICD-10-CM | POA: Diagnosis not present

## 2019-03-16 DIAGNOSIS — N186 End stage renal disease: Secondary | ICD-10-CM | POA: Diagnosis not present

## 2019-03-16 DIAGNOSIS — N2581 Secondary hyperparathyroidism of renal origin: Secondary | ICD-10-CM | POA: Diagnosis not present

## 2019-03-16 DIAGNOSIS — I2510 Atherosclerotic heart disease of native coronary artery without angina pectoris: Secondary | ICD-10-CM

## 2019-03-16 DIAGNOSIS — D631 Anemia in chronic kidney disease: Secondary | ICD-10-CM | POA: Diagnosis not present

## 2019-03-16 DIAGNOSIS — Z992 Dependence on renal dialysis: Secondary | ICD-10-CM | POA: Diagnosis not present

## 2019-03-16 MED ORDER — METHYLPREDNISOLONE ACETATE 40 MG/ML IJ SUSP
40.0000 mg | Freq: Once | INTRAMUSCULAR | Status: AC
  Administered 2019-03-16: 11:00:00 40 mg via INTRA_ARTICULAR

## 2019-03-16 NOTE — Progress Notes (Signed)
Subjective:    Jeffrey [name] - 63 y.o. male MRN [id number]  Date of birth: Jul 06, 1956  CC:  Jeffrey [name] is here for left shoulder pain.  HPI: Jeffrey [name] reports that he fell backward onto his left shoulder in January, and since then, he has had left shoulder pain.  He had another fall a couple months later which also exacerbated his pain.  He says that his pain varies in severity from 1 day to the next, but most days the range of motion of his left shoulder is very restricted due to pain, especially with overhead activities.  He has been using Voltaren gel, sports tape, and Tylenol for pain relief.  He came in to see his doctor concerning this issue at the encouragement of his occupational therapist, who sees him weekly at home.  He was referred here from the [facility] internal medicine clinic.  Jeffrey [name] received a left shoulder injection from [name] in September 2019, but he does not remember this occurring.  Health Maintenance:  Health Maintenance Due  Topic Date Due  . TETANUS/TDAP  04/22/1973    -  reports that he quit smoking about 6 years ago. His smoking use included cigarettes. He quit after 10.00 years of use. He has never used smokeless tobacco. - Review of Systems: Per HPI. - Past Medical History: Patient Active Problem List   Diagnosis Date Noted  . Arthritis of left shoulder region 03/16/2019  . Pleural effusion on left 11/11/2018  . S/P Left VATS with Drainage of Pleural Effusion, Decortication of Visceral/Parietal Pleura 11/04/2018  . Physical deconditioning 09/16/2018  . S/P aortic valve replacement with bioprosthetic valve 07/28/2018  . Severe aortic insufficiency 07/28/2018  . Cerebral thrombosis with cerebral infarction 07/13/2018  . Seizure disorder ([place]) 07/10/2018  . Status epilepticus ([place]) 07/10/2018  . Altered mental status 07/09/2018  . Hypertensive urgency 07/04/2018  . OSA (obstructive sleep apnea) 05/31/2018  . Onychomycosis of right great toe  05/09/2018  . 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 Left shoulder pain   . 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 Coagulase negative Staphylococcus bacteremia   . Hypervolemia   . ESRD on hemodialysis ([place])   . Asymptomatic HIV infection ([place])   . CAD (coronary artery disease) 04/19/2018  . Dyspnea 04/10/2018  . Loud snoring 04/08/2018  . Upper GI bleed 04/08/2018  . NSTEMI (non-ST elevated myocardial infarction) ([place]) 03/15/2018  . PRES (posterior reversible encephalopathy syndrome) 03/14/2018  . HTN (hypertension), malignant 03/14/2018  . Malnutrition of moderate degree 02/23/2018  . Seizure ([place]) 02/22/2018  . Mass of duodenum   . Antibiotic long-term use   . Septic arthritis ([place]) 09/15/2017  . Encounter for incision and drainage procedure 09/15/2017  . Effusion of left knee 09/08/2017  . Intertrigo 07/09/2017  . AC joint arthropathy 06/16/2017  . Pseudoaneurysm of arteriovenous dialysis fistula ([place]) 12/23/2016  . Eczema 12/10/2016  . Bilateral low back pain without sciatica   . Aortic valve insufficiency   . Fall 09/08/2016  . Knee abrasion, right, initial encounter 09/08/2016  . Lumbosacral spondylosis without myelopathy 08/03/2016  . Pulmonary nodules 08/03/2016  . Essential hypertension   . Anemia of chronic disease 04/08/2016  . Secondary hyperparathyroidism ([place]) 02/09/2014  . Asthma 02/09/2014  . Nuclear sclerotic cataract of both eyes 12/12/2013  . Pruritus 10/26/2013  . Myopia with presbyopia of both eyes 10/26/2013  . COPD (chronic obstructive pulmonary disease) ([place]) 06/27/2013  . Thrombocytopenia ([place]) 01/09/2013  .  HBV (hepatitis B virus) infection 01/09/2013  . Anxiety 07/22/2011  . HIV disease ([place]) 06/17/2011   - Medications: reviewed and updated   Objective:   Physical Exam BP (!) 144/80   Ht 6' (1.829 m)   Wt 191 lb (86.6 kg)   BMI 25.90 kg/m  Gen: NAD, alert, cooperative with exam, appears older than stated  age Shoulder, left:  No evidence of bony deformity, asymmetry, or muscle atrophy; No tenderness over long head of biceps (bicipital groove). Mild TTP at [facility] joint. Severely limited ROM with external rotation (20 degrees), abduction (30 degrees), and elevation (20 degrees), significant pain with thumb to spine, very limited ROM. Strength difficult to assess due to pain. No abnormal scapular function observed. Sensation intact. Peripheral pulses intact.  Special Tests:   - Crossarm test: Unable to perform due to pain   - Empty can: Positive   - Hawkins: Unable to perform due to pain   - Neer test: Unable to perform due to pain   - Obrien's test: Unable to perform due to pain  Shoulder, right: No evidence of bony deformity, asymmetry, or muscle atrophy; No tenderness over long head of biceps (bicipital groove). No TTP at [facility] joint. Full active and passive range of motion (180 flex [place] /150Abd /90ER /70IR), Thumb to T12 without significant tenderness. Strength 5/5 throughout. No abnormal scapular function observed. Sensation intact. Peripheral pulses intact.  Special Tests:   - Empty can: NEG   - Hawkins: NEG   - Neer test: NEG  [name] Left Shoulder  BT short:  Fluid visualized surrounding tendon, no tears, no tenosynovitis BT long: normal appearance, intact without tears Supraspinatus tendon: normal appearance, no tears of degenerative changes, no abnormal fluid within the bursa Subscapularis tendon: normal appearance without tears or degenerative changes Infraspinatus tendon:   Some irregularity noted with possible partial tearing, fluid visualized within tendon Teres Minor tendon:  normal appearance without tears or degenerative changes GH joint: normal appearing posterior labrum AC joint:  Minimal degenerative changes, no geyser sign  Summary and Additional findings-no full tear of rotator cuff muscle, although irregularity and inflammatory changes are visualized      Assessment & Plan:    Arthritis of left shoulder region Patient's 2 recent falls have likely exacerbated the significant arthritis he has in his left shoulder, which was visualized on MRI in August 2019.  Unfortunately, therapy is limited currently due to his significant amount of pain.  Even if he had a full tear of his rotator cuff, he would not be a candidate for rotator cuff repair or shoulder replacement due to his significant comorbidities.  Patient was given a intra-articular left shoulder corticosteroid injection today to help alleviate his pain.  Hopefully, his range of motion will improve with less pain and he may be able to incorporate strengthening exercises over time.  Procedure performed: Left glenohumeral corticosteroid injection, ultrasound-guided Consent obtained and verified. Time-out conducted. Noted no overlying erythema, induration, or other signs of local infection. The   glenohumeral joint was visualized by ultrasound posteriorly.  The overlying skin was prepped in a sterile fashion. Topical analgesic spray: Ethyl chloride. Joint:  glenohumeral Needle: 22-gauge 3.5 inch Completed without difficulty. Meds: 3-1 Marcaine to Depo-Medrol    Maia Breslow, M.D. 03/16/2019, 11:36 AM PGY-2, [place]

## 2019-03-16 NOTE — Assessment & Plan Note (Signed)
Patient's 2 recent falls have likely exacerbated the significant arthritis he has in his left shoulder, which was visualized on MRI in August 2019.  Unfortunately, therapy is limited currently due to his significant amount of pain.  Even if he had a full tear of his rotator cuff, he would not be a candidate for rotator cuff repair or shoulder replacement due to his significant comorbidities.  Patient was given a intra-articular left shoulder corticosteroid injection today to help alleviate his pain.  Hopefully, his range of motion will improve with less pain and he may be able to incorporate strengthening exercises over time.

## 2019-03-16 NOTE — Patient Instructions (Addendum)
You flared the arthritis in your shoulder.  You do have a small partial tear of one of your rotator cuff muscles but this should heal with conservative treatment and the rest of the muscles look ok. You were given an injection today into the shoulder joint. Icing 15 minutes at a time 3-4 times a day. Simple motion exercises (arm circles, swings) to help regain some of the motion of the shoulder. Topical biofreeze or capsaicin may help with pain. Follow up with me in 6 weeks.

## 2019-03-17 DIAGNOSIS — D631 Anemia in chronic kidney disease: Secondary | ICD-10-CM | POA: Diagnosis not present

## 2019-03-17 DIAGNOSIS — N2581 Secondary hyperparathyroidism of renal origin: Secondary | ICD-10-CM | POA: Diagnosis not present

## 2019-03-17 DIAGNOSIS — N186 End stage renal disease: Secondary | ICD-10-CM | POA: Diagnosis not present

## 2019-03-17 DIAGNOSIS — Z992 Dependence on renal dialysis: Secondary | ICD-10-CM | POA: Diagnosis not present

## 2019-03-17 DIAGNOSIS — D509 Iron deficiency anemia, unspecified: Secondary | ICD-10-CM | POA: Diagnosis not present

## 2019-03-19 IMAGING — CT CT CHEST W/O CM
2 of 3 series · 15 of 36 positions shown, 18 images · non-contrast
Comparison: Chest CT dated 03/30/2017.

CLINICAL DATA: History of end-stage renal disease on home dialysis,
HIV, hepatitis-B, chronic anemia, pulmonary nodule. Follow-up for
lung nodule.

EXAM:
CT CHEST WITHOUT CONTRAST
TECHNIQUE: Multidetector CT imaging of the chest was performed following the
standard protocol without IV contrast.

[Series 3: chest w/o 2mm st · axial · non-contrast · 0.79mm/px · z∈[+1015,+1323]mm · 12 of 182 slices shown, 15 images]
[im 14/182  mediastinal]
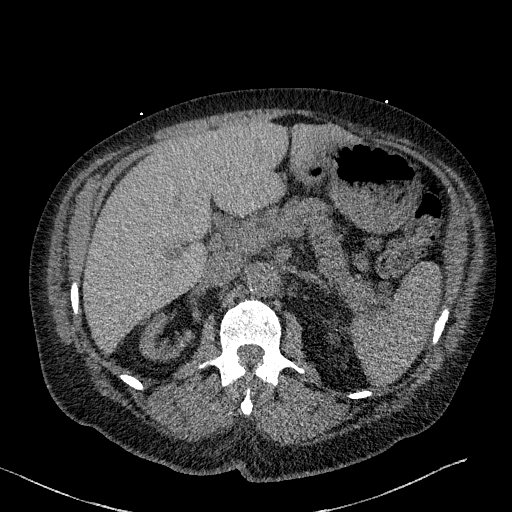
[im 14/182  lung]
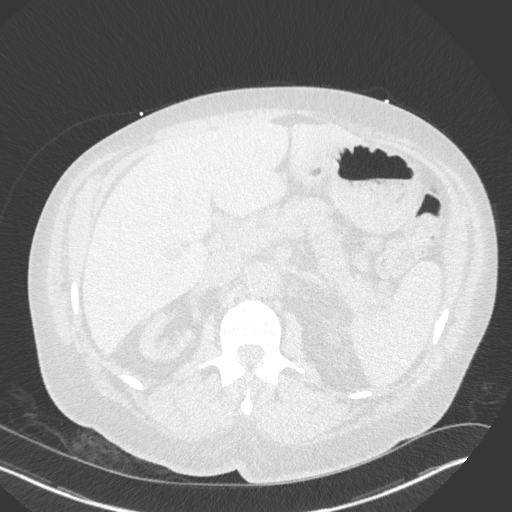
[im 27/182  lung]
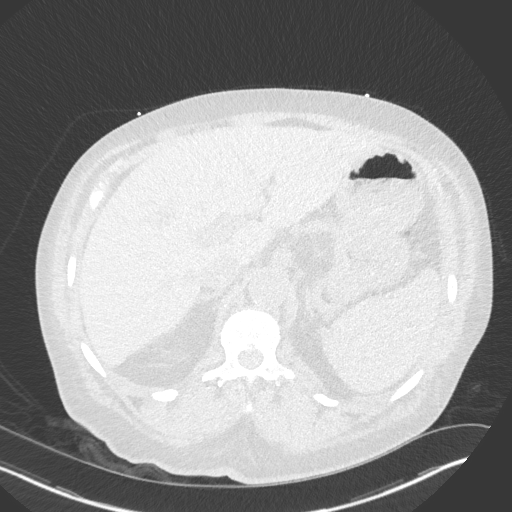
[im 41/182  lung]
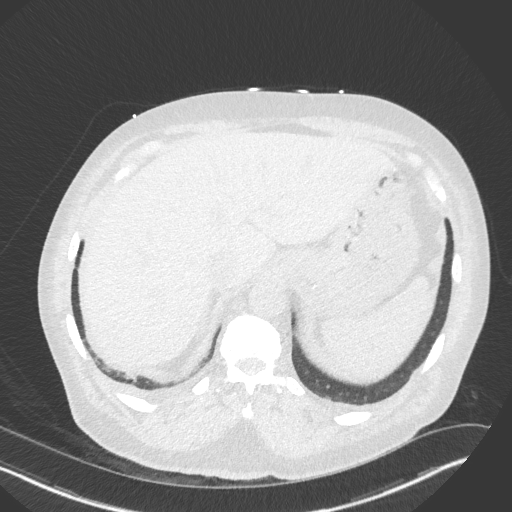
[im 54/182  lung]
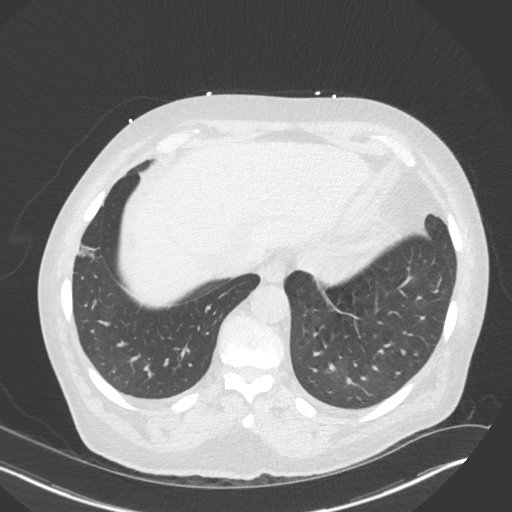
[im 68/182  mediastinal]
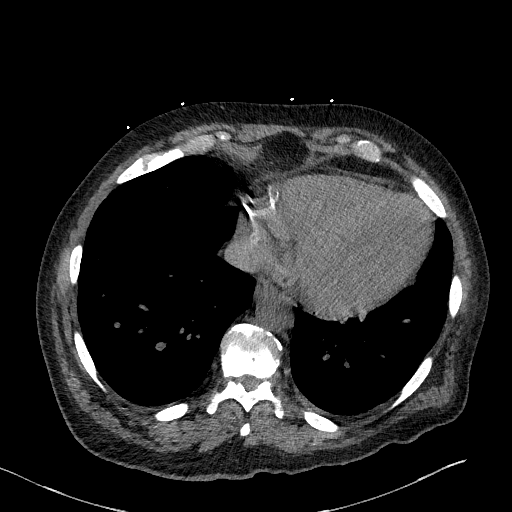
[im 68/182  lung]
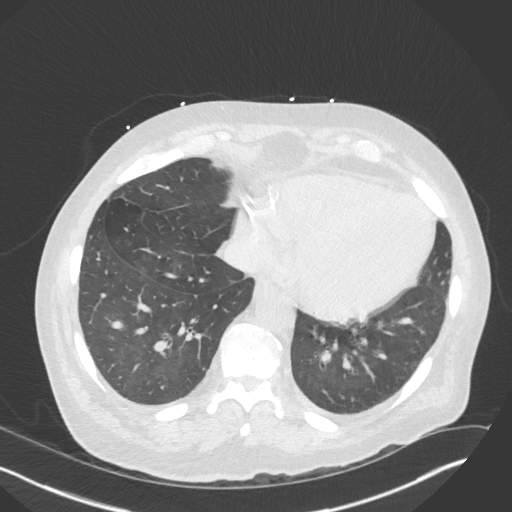
[im 81/182  lung]
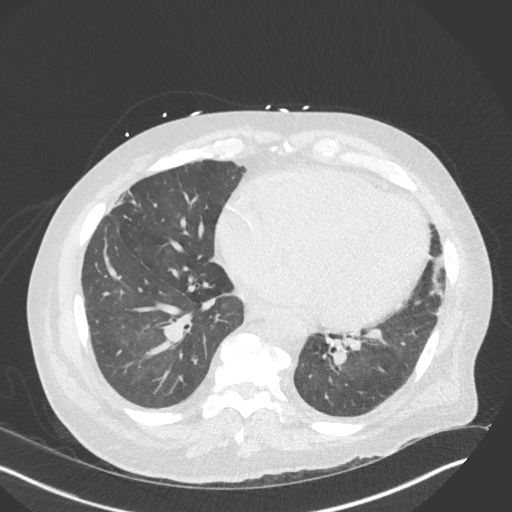
[im 101/182  lung]
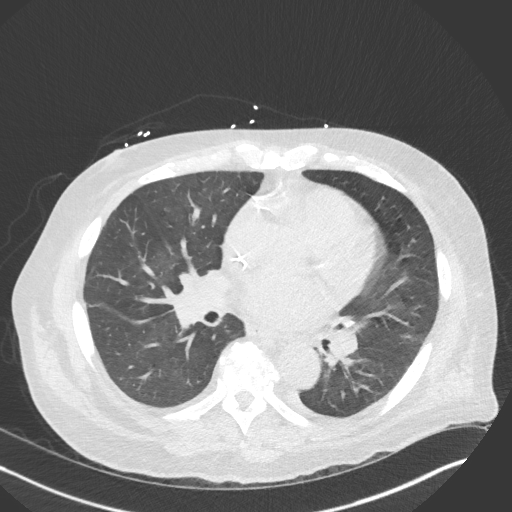
[im 114/182  lung]
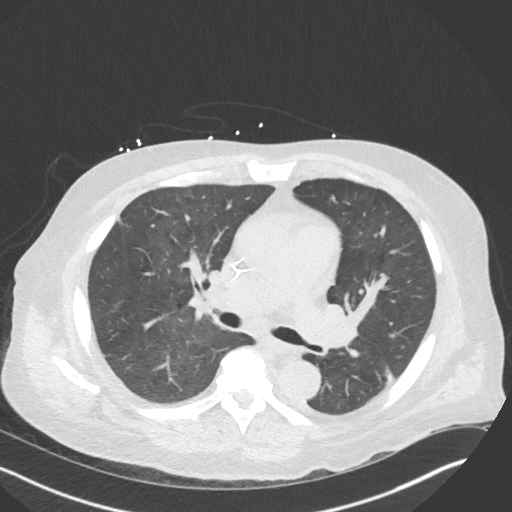
[im 128/182  mediastinal]
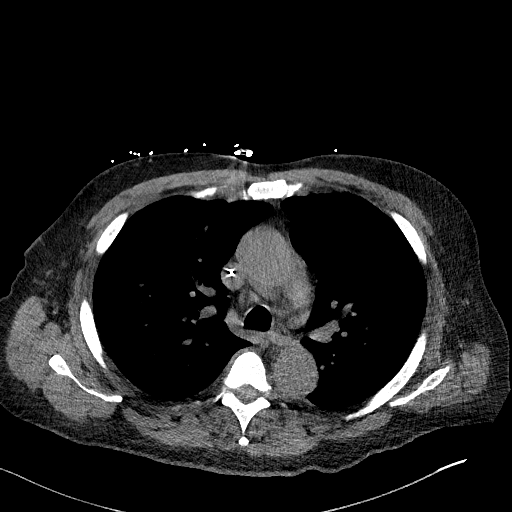
[im 128/182  lung]
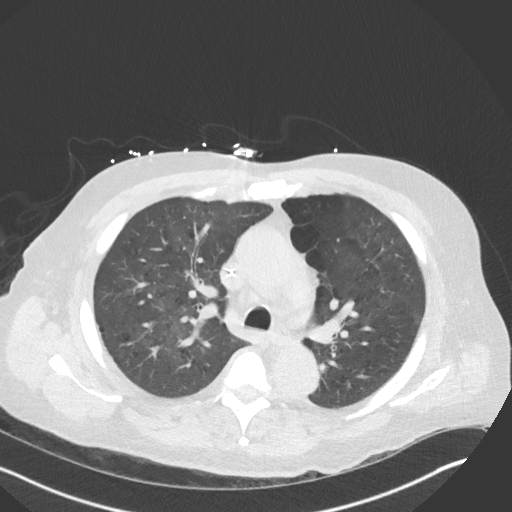
[im 141/182  lung]
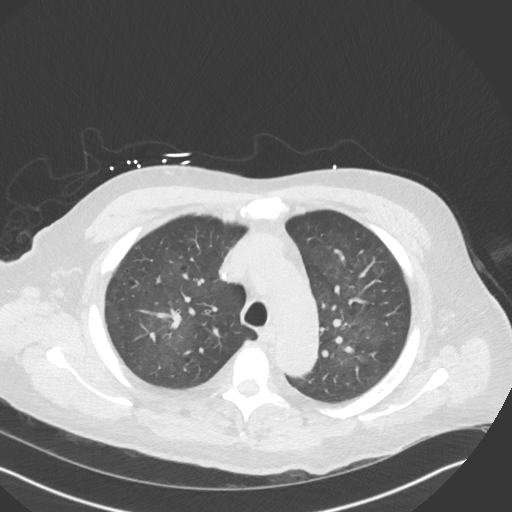
[im 155/182  lung]
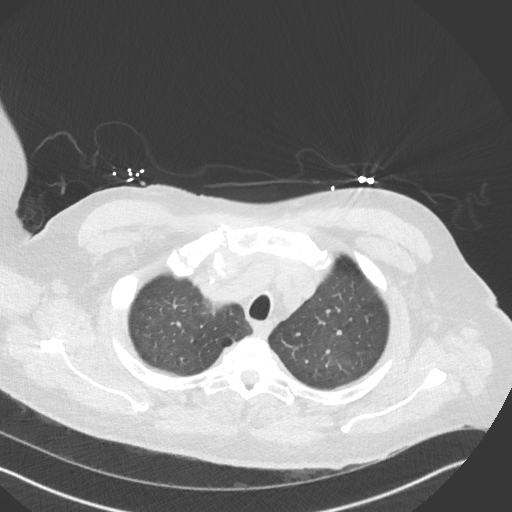
[im 168/182  lung]
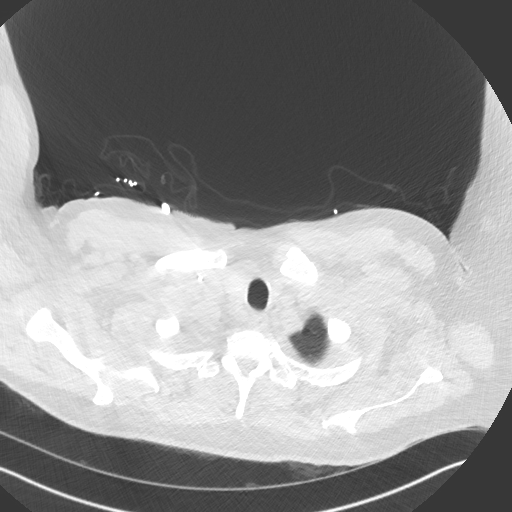

[Series 6: chest w/o 3mm st cor · coronal · non-contrast · 0.71mm/px · 3 of 103 slices shown]
[im 21/103  lung]
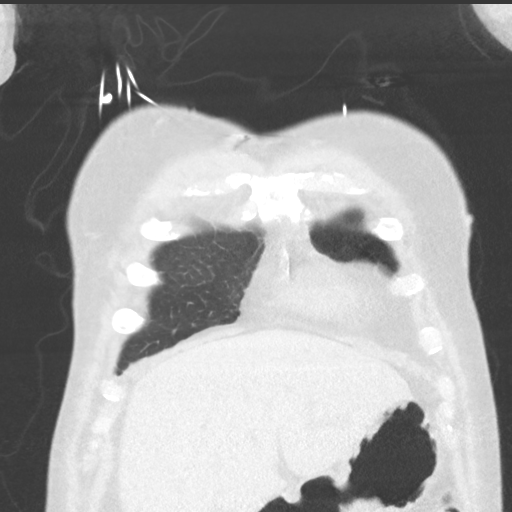
[im 41/103  lung]
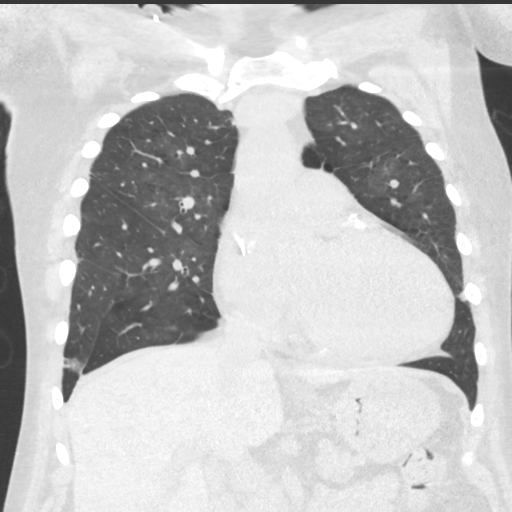
[im 62/103  lung]
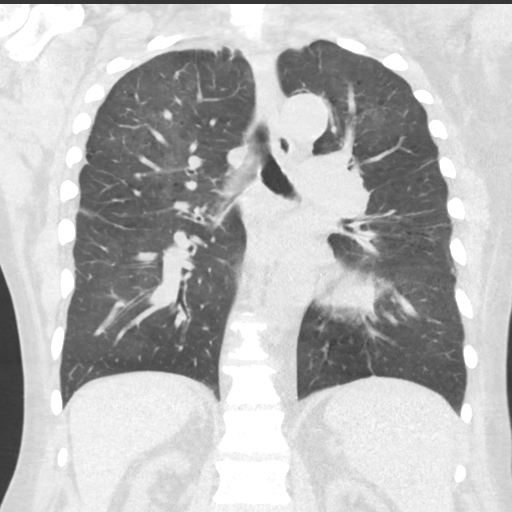

[15 of 36 positions shown; findings below may reference images not displayed]

FINDINGS: Cardiovascular: Cardiomegaly. No pericardial effusion. Coronary
artery calcifications. Mild aortic atherosclerosis. No aortic
aneurysm.

Mediastinum/Nodes: No mass or enlarged lymph nodes seen within the
mediastinum or perihilar regions. Esophagus appears normal. Trachea
and central bronchi are unremarkable.

Lungs/Pleura: Emphysema appears stable, mild to moderate in degree.

10 mm pulmonary nodule within the left lower lobe is stable (series
4, image 121).

7 mm pulmonary nodule within the right lower lobe is stable (series
4, image 126).

4 mm pleural based pulmonary nodule within the lingula is stable
(series 4, image 35).

No new pulmonary nodules or masses identified.  No pleural effusion.

Upper Abdomen: No acute findings. Atrophic kidneys are incompletely
imaged.

Musculoskeletal: Mild degenerative change within the lower thoracic
spine. No acute or suspicious osseous finding.
IMPRESSION: 1. Stable pulmonary nodules bilaterally. Largest pulmonary nodule is
again seen within the left lower lobe, measuring 10 mm. Non-contrast
chest CT at 18-24 months (from today's scan) is considered optional
for low-risk patients, but is recommended for high-risk patients.
This recommendation follows the consensus statement: Guidelines for
Management of Incidental Pulmonary Nodules Detected on CT Images:
2. No acute findings.
3. Cardiomegaly.

Aortic Atherosclerosis (8ZR24-ME0.0) and Emphysema (8ZR24-WTB.X).

## 2019-03-20 ENCOUNTER — Encounter: Payer: Self-pay | Admitting: Family Medicine

## 2019-03-20 DIAGNOSIS — D509 Iron deficiency anemia, unspecified: Secondary | ICD-10-CM | POA: Diagnosis not present

## 2019-03-20 DIAGNOSIS — N186 End stage renal disease: Secondary | ICD-10-CM | POA: Diagnosis not present

## 2019-03-20 DIAGNOSIS — Z992 Dependence on renal dialysis: Secondary | ICD-10-CM | POA: Diagnosis not present

## 2019-03-20 DIAGNOSIS — N2581 Secondary hyperparathyroidism of renal origin: Secondary | ICD-10-CM | POA: Diagnosis not present

## 2019-03-20 DIAGNOSIS — D631 Anemia in chronic kidney disease: Secondary | ICD-10-CM | POA: Diagnosis not present

## 2019-03-21 DIAGNOSIS — N2581 Secondary hyperparathyroidism of renal origin: Secondary | ICD-10-CM | POA: Diagnosis not present

## 2019-03-21 DIAGNOSIS — D509 Iron deficiency anemia, unspecified: Secondary | ICD-10-CM | POA: Diagnosis not present

## 2019-03-21 DIAGNOSIS — D631 Anemia in chronic kidney disease: Secondary | ICD-10-CM | POA: Diagnosis not present

## 2019-03-21 DIAGNOSIS — N186 End stage renal disease: Secondary | ICD-10-CM | POA: Diagnosis not present

## 2019-03-21 DIAGNOSIS — Z992 Dependence on renal dialysis: Secondary | ICD-10-CM | POA: Diagnosis not present

## 2019-03-22 DIAGNOSIS — J449 Chronic obstructive pulmonary disease, unspecified: Secondary | ICD-10-CM | POA: Diagnosis not present

## 2019-03-22 DIAGNOSIS — D631 Anemia in chronic kidney disease: Secondary | ICD-10-CM | POA: Diagnosis not present

## 2019-03-22 DIAGNOSIS — N186 End stage renal disease: Secondary | ICD-10-CM | POA: Diagnosis not present

## 2019-03-22 DIAGNOSIS — I120 Hypertensive chronic kidney disease with stage 5 chronic kidney disease or end stage renal disease: Secondary | ICD-10-CM | POA: Diagnosis not present

## 2019-03-22 DIAGNOSIS — I2510 Atherosclerotic heart disease of native coronary artery without angina pectoris: Secondary | ICD-10-CM | POA: Diagnosis not present

## 2019-03-22 DIAGNOSIS — M47817 Spondylosis without myelopathy or radiculopathy, lumbosacral region: Secondary | ICD-10-CM | POA: Diagnosis not present

## 2019-03-23 DIAGNOSIS — D631 Anemia in chronic kidney disease: Secondary | ICD-10-CM | POA: Diagnosis not present

## 2019-03-23 DIAGNOSIS — N186 End stage renal disease: Secondary | ICD-10-CM | POA: Diagnosis not present

## 2019-03-23 DIAGNOSIS — Z992 Dependence on renal dialysis: Secondary | ICD-10-CM | POA: Diagnosis not present

## 2019-03-23 DIAGNOSIS — D509 Iron deficiency anemia, unspecified: Secondary | ICD-10-CM | POA: Diagnosis not present

## 2019-03-23 DIAGNOSIS — N2581 Secondary hyperparathyroidism of renal origin: Secondary | ICD-10-CM | POA: Diagnosis not present

## 2019-03-24 DIAGNOSIS — Z992 Dependence on renal dialysis: Secondary | ICD-10-CM | POA: Diagnosis not present

## 2019-03-24 DIAGNOSIS — D509 Iron deficiency anemia, unspecified: Secondary | ICD-10-CM | POA: Diagnosis not present

## 2019-03-24 DIAGNOSIS — D631 Anemia in chronic kidney disease: Secondary | ICD-10-CM | POA: Diagnosis not present

## 2019-03-24 DIAGNOSIS — N2581 Secondary hyperparathyroidism of renal origin: Secondary | ICD-10-CM | POA: Diagnosis not present

## 2019-03-24 DIAGNOSIS — N186 End stage renal disease: Secondary | ICD-10-CM | POA: Diagnosis not present

## 2019-03-27 DIAGNOSIS — N186 End stage renal disease: Secondary | ICD-10-CM | POA: Diagnosis not present

## 2019-03-27 DIAGNOSIS — Z992 Dependence on renal dialysis: Secondary | ICD-10-CM | POA: Diagnosis not present

## 2019-03-27 DIAGNOSIS — D509 Iron deficiency anemia, unspecified: Secondary | ICD-10-CM | POA: Diagnosis not present

## 2019-03-27 DIAGNOSIS — D631 Anemia in chronic kidney disease: Secondary | ICD-10-CM | POA: Diagnosis not present

## 2019-03-27 DIAGNOSIS — N2581 Secondary hyperparathyroidism of renal origin: Secondary | ICD-10-CM | POA: Diagnosis not present

## 2019-03-27 MED FILL — ETHYL CHLORIDE SPRAY: 25 days supply | Qty: 104 | Fill #0

## 2019-03-28 DIAGNOSIS — D631 Anemia in chronic kidney disease: Secondary | ICD-10-CM | POA: Diagnosis not present

## 2019-03-28 DIAGNOSIS — N2581 Secondary hyperparathyroidism of renal origin: Secondary | ICD-10-CM | POA: Diagnosis not present

## 2019-03-28 DIAGNOSIS — N186 End stage renal disease: Secondary | ICD-10-CM | POA: Diagnosis not present

## 2019-03-28 DIAGNOSIS — D509 Iron deficiency anemia, unspecified: Secondary | ICD-10-CM | POA: Diagnosis not present

## 2019-03-28 DIAGNOSIS — Z992 Dependence on renal dialysis: Secondary | ICD-10-CM | POA: Diagnosis not present

## 2019-03-29 DIAGNOSIS — M47817 Spondylosis without myelopathy or radiculopathy, lumbosacral region: Secondary | ICD-10-CM | POA: Diagnosis not present

## 2019-03-29 DIAGNOSIS — N186 End stage renal disease: Secondary | ICD-10-CM | POA: Diagnosis not present

## 2019-03-29 DIAGNOSIS — I120 Hypertensive chronic kidney disease with stage 5 chronic kidney disease or end stage renal disease: Secondary | ICD-10-CM | POA: Diagnosis not present

## 2019-03-29 DIAGNOSIS — I2510 Atherosclerotic heart disease of native coronary artery without angina pectoris: Secondary | ICD-10-CM | POA: Diagnosis not present

## 2019-03-29 DIAGNOSIS — J449 Chronic obstructive pulmonary disease, unspecified: Secondary | ICD-10-CM | POA: Diagnosis not present

## 2019-03-29 DIAGNOSIS — D631 Anemia in chronic kidney disease: Secondary | ICD-10-CM | POA: Diagnosis not present

## 2019-03-30 DIAGNOSIS — N186 End stage renal disease: Secondary | ICD-10-CM | POA: Diagnosis not present

## 2019-03-30 DIAGNOSIS — D631 Anemia in chronic kidney disease: Secondary | ICD-10-CM | POA: Diagnosis not present

## 2019-03-30 DIAGNOSIS — Z992 Dependence on renal dialysis: Secondary | ICD-10-CM | POA: Diagnosis not present

## 2019-03-30 DIAGNOSIS — D509 Iron deficiency anemia, unspecified: Secondary | ICD-10-CM | POA: Diagnosis not present

## 2019-03-30 DIAGNOSIS — N2581 Secondary hyperparathyroidism of renal origin: Secondary | ICD-10-CM | POA: Diagnosis not present

## 2019-03-31 DIAGNOSIS — N2581 Secondary hyperparathyroidism of renal origin: Secondary | ICD-10-CM | POA: Diagnosis not present

## 2019-03-31 DIAGNOSIS — Z992 Dependence on renal dialysis: Secondary | ICD-10-CM | POA: Diagnosis not present

## 2019-03-31 DIAGNOSIS — D509 Iron deficiency anemia, unspecified: Secondary | ICD-10-CM | POA: Diagnosis not present

## 2019-03-31 DIAGNOSIS — D631 Anemia in chronic kidney disease: Secondary | ICD-10-CM | POA: Diagnosis not present

## 2019-03-31 DIAGNOSIS — N186 End stage renal disease: Secondary | ICD-10-CM | POA: Diagnosis not present

## 2019-04-03 DIAGNOSIS — N2581 Secondary hyperparathyroidism of renal origin: Secondary | ICD-10-CM | POA: Diagnosis not present

## 2019-04-03 DIAGNOSIS — D509 Iron deficiency anemia, unspecified: Secondary | ICD-10-CM | POA: Diagnosis not present

## 2019-04-03 DIAGNOSIS — D631 Anemia in chronic kidney disease: Secondary | ICD-10-CM | POA: Diagnosis not present

## 2019-04-03 DIAGNOSIS — Z992 Dependence on renal dialysis: Secondary | ICD-10-CM | POA: Diagnosis not present

## 2019-04-03 DIAGNOSIS — N186 End stage renal disease: Secondary | ICD-10-CM | POA: Diagnosis not present

## 2019-04-04 DIAGNOSIS — D631 Anemia in chronic kidney disease: Secondary | ICD-10-CM | POA: Diagnosis not present

## 2019-04-04 DIAGNOSIS — N2581 Secondary hyperparathyroidism of renal origin: Secondary | ICD-10-CM | POA: Diagnosis not present

## 2019-04-04 DIAGNOSIS — N186 End stage renal disease: Secondary | ICD-10-CM | POA: Diagnosis not present

## 2019-04-04 DIAGNOSIS — D509 Iron deficiency anemia, unspecified: Secondary | ICD-10-CM | POA: Diagnosis not present

## 2019-04-04 DIAGNOSIS — Z992 Dependence on renal dialysis: Secondary | ICD-10-CM | POA: Diagnosis not present

## 2019-04-05 DIAGNOSIS — D631 Anemia in chronic kidney disease: Secondary | ICD-10-CM | POA: Diagnosis not present

## 2019-04-05 DIAGNOSIS — I120 Hypertensive chronic kidney disease with stage 5 chronic kidney disease or end stage renal disease: Secondary | ICD-10-CM | POA: Diagnosis not present

## 2019-04-05 DIAGNOSIS — M47817 Spondylosis without myelopathy or radiculopathy, lumbosacral region: Secondary | ICD-10-CM | POA: Diagnosis not present

## 2019-04-05 DIAGNOSIS — N186 End stage renal disease: Secondary | ICD-10-CM | POA: Diagnosis not present

## 2019-04-05 DIAGNOSIS — I2510 Atherosclerotic heart disease of native coronary artery without angina pectoris: Secondary | ICD-10-CM | POA: Diagnosis not present

## 2019-04-05 DIAGNOSIS — J449 Chronic obstructive pulmonary disease, unspecified: Secondary | ICD-10-CM | POA: Diagnosis not present

## 2019-04-06 ENCOUNTER — Encounter: Payer: Self-pay | Admitting: Neurology

## 2019-04-06 ENCOUNTER — Other Ambulatory Visit: Payer: Self-pay

## 2019-04-06 ENCOUNTER — Ambulatory Visit (INDEPENDENT_AMBULATORY_CARE_PROVIDER_SITE_OTHER): Payer: Medicare Other | Admitting: Neurology

## 2019-04-06 DIAGNOSIS — D509 Iron deficiency anemia, unspecified: Secondary | ICD-10-CM | POA: Diagnosis not present

## 2019-04-06 DIAGNOSIS — N186 End stage renal disease: Secondary | ICD-10-CM | POA: Diagnosis not present

## 2019-04-06 DIAGNOSIS — Z992 Dependence on renal dialysis: Secondary | ICD-10-CM | POA: Diagnosis not present

## 2019-04-06 DIAGNOSIS — G40909 Epilepsy, unspecified, not intractable, without status epilepticus: Secondary | ICD-10-CM | POA: Diagnosis not present

## 2019-04-06 DIAGNOSIS — N2581 Secondary hyperparathyroidism of renal origin: Secondary | ICD-10-CM | POA: Diagnosis not present

## 2019-04-06 DIAGNOSIS — D631 Anemia in chronic kidney disease: Secondary | ICD-10-CM | POA: Diagnosis not present

## 2019-04-06 MED ORDER — VIMPAT 150 MG PO TABS: 1.0000 | ORAL_TABLET | Freq: Two times a day (BID) | ORAL | 5 refills | Status: AC

## 2019-04-06 NOTE — Progress Notes (Signed)
PATIENT: Jeffrey [name] DOB: 07-31-1956  REASON FOR VISIT: follow up HISTORY FROM: patient  HISTORY OF PRESENT ILLNESS: Today 04/06/19  HISTORY  OSBALDO [name] is a 63 years old male, seen in request by his primary care physician Dr. Koleen Distance, Nila Nephew, and Dr. Velna Ochs for evaluation of seizure, he is accompanied by his brother [name] at today's clinical visit.  Initial evaluation was on June 06, 2018.  I have reviewed and summarized the referring note from the referring physician, he has past medical history of end-stage renal disease on dialysis, HIV, on antivirus treatment with undetectable virus load, anemia, hepatitis B, endocarditis, he is getting home dialysis by his brother 3 times a week since 2012,  First seizure was on June 01, 2011, generalized tonic-clonic seizure, was considered due to worsening end-stage dialysis, was treated with hemodialysis,  Second seizure was in June 2019, he was initially treated with Keppra, which was discontinued due to thrombocytopenia, but with negative heat antibody, Keppra is most likely the culprit.  Third seizure was in July 2019, was considered due to rapid hemodynamic shifting, brother reported to much fluid was taken out,  He was treated with Vimpat 75 mg twice a day since August 2019, with extra 25 mg after each dialysis,  For seizure was on June 04, 2018, he had sudden onset black staring, with post event confusion, fatigue,  He also had a witnessed episode at today's visit on June 06, 2018, he had transient stairs, unresponsive, then complains of fatigue, he did not sleep well last night, also complains of nausea, generalized weakness.  Echocardiogram on May 03, 2018, left ventricular apex is trabeculated, there is a false tendon in the LV apex, there was severe concentric hypertrophy, ejection fraction was 55 to 60%, wall motion was normal, aortic valve showed moderate to severe regurgitation, left  atrium was severely dilated, mild dilated right ventricular size, left pleural effusion and trivial pericardial effusion posteriorly,  Personally reviewed MRI of the brain without contrast on April 11, 2018, no acute abnormality, stable mild generalized atrophy, supratentorium small vessel disease.  MRI of thoracic spine: Degenerative changes, no significant canal foraminal stenosis, left paracentral disc protrusion at C7-T1 MRI of lumbar showed stable appearance of lumbar spine, degenerative disc disease at L4-5, L5-S1, there was no acute abnormality, mild edema in the paraspinal muscle,  UPDATE Jan 16th 2020: He is accompanied by his brother [name] at visit, he had AV replacement on Jul 24 2018,  Last seizure was in Oct 2019, he stretched out, seized, had bowel movement lasting 10-15 minutes, now he is on vimpat 150mg  bid, he feel sleep, tired all the time.  He also has pleural effusion procedure recently.  EEG showed generalized slowing.  Update April 06, 2019 SS: Accompanied by his brother [name], indicates he has not had recurrent seizure.  He remains on home hemodialysis 4 days a week, that his brother manages.  His health has been well since last visit.  He is tolerating the Vimpat without side effect.  He remains on Vimpat 150 mg twice daily, he says his last seizure occurred in September 2019, with a staring episode.  His blood pressure is elevated today, compliant with blood pressure medications, dialyzed last night.  Has clonidine at home to take as needed for HTN.  He denies dizziness, headache, blurry vision, no symptoms.   REVIEW OF SYSTEMS: Out of a complete 14 system review of symptoms, the patient complains only of the following symptoms, and all  other reviewed systems are negative.  Seizures  ALLERGIES: Allergies  Allergen Reactions   Lisinopril Anaphylaxis and Shortness Of Breath    Throat swelling   Penicillins Anaphylaxis and Other (See Comments)    Childhood  allergy Has patient had a PCN reaction causing immediate rash, facial/tongue/throat swelling, SOB or lightheadedness with hypotension: Yes Has patient had a PCN reaction causing severe rash involving mucus membranes or skin necrosis: Unk Has patient had a PCN reaction that required hospitalization: Unk Has patient had a PCN reaction occurring within the last 10 years: No If all of the above answers are "NO", then may proceed with Cephalosporin use.    Fentanyl Other (See Comments)    Lethargy, AMS   Morphine And Related Other (See Comments)    "Not Himself"    HOME MEDICATIONS: Outpatient Medications Prior to Visit  Medication Sig Dispense Refill   acetaminophen (TYLENOL) 325 MG tablet Take 2 tablets (650 mg total) by mouth every 6 (six) hours as needed for fever, headache, mild pain or moderate pain.     albuterol (PROVENTIL) (2.5 MG/3ML) 0.083% nebulizer solution Take 3 mLs (2.5 mg total) by nebulization every 6 (six) hours as needed for wheezing or shortness of breath. 360 mL 0   Amino Acids-Protein Hydrolys (FEEDING SUPPLEMENT, PRO-STAT SUGAR FREE 64,) LIQD Take 30 mLs by mouth daily.     amLODipine (NORVASC) 10 MG tablet Take 1 tablet (10 mg total) by mouth daily. 90 tablet 1   aspirin EC 81 MG tablet Take 81 mg by mouth daily.     BIKTARVY 50-200-25 MG TABS tablet TAKE 1 TABLET BY MOUTH DAILY 30 tablet 5   cloNIDine (CATAPRES - DOSED IN MG/24 HR) 0.3 mg/24hr patch APPLY 1 PATCH(0.3 MG) EXTERNALLY TO THE SKIN 1 TIME A WEEK 4 patch 3   diclofenac sodium (VOLTAREN) 1 % GEL Apply 2 g topically 4 (four) times daily as needed (Pain). 50 g 1   Epoetin Alfa (EPOGEN IJ) Inject as directed. THREE TIMES A WEEK AT DIALYSIS     ferric gluconate 125 mg in sodium chloride 0.9 % 100 mL Inject 125 mg into the vein every Monday, Wednesday, and Friday with hemodialysis.     hydrALAZINE (APRESOLINE) 100 MG tablet Take 1 tablet (100 mg total) by mouth 3 (three) times daily. 90 tablet 1    iron sucrose (VENOFER) 20 MG/ML injection Inject 500 mg into the vein every 14 (fourteen) days.     isosorbide mononitrate (ISMO,MONOKET) 20 MG tablet Take 1 tablet (20 mg total) by mouth 2 (two) times daily at 10 AM and 5 PM. 180 tablet 1   lactulose (CHRONULAC) 10 GM/15ML solution Take 30 mLs (20 g total) by mouth daily as needed for moderate constipation. 473 mL 1   losartan (COZAAR) 25 MG tablet Take 25 mg by mouth daily.     multivitamin (RENA-VIT) TABS tablet Take 1 tablet by mouth daily. 30 tablet 0   NONFORMULARY OR COMPOUNDED ITEM [facility] Terbinafine 3% Fluconazole 2% Tea Tree Oil 5% Urea 10% Ibuprofen 2% in DMSO suspension #29mL AQT:MAUQJ to the affected nail(S) once (at bedtime) Antifungal 30 each 5   omeprazole (PRILOSEC) 20 MG capsule Take 1 capsule (20 mg total) by mouth daily. 90 capsule 1   traMADol (ULTRAM) 50 MG tablet Take 1 tablet (50 mg total) by mouth every 12 (twelve) hours as needed. 14 tablet 0   traZODone (DESYREL) 50 MG tablet TAKE 1 TABLET(50 MG) BY MOUTH AT BEDTIME AS NEEDED FOR  SLEEP 30 tablet 5   Lacosamide (VIMPAT) 150 MG TABS Take 1 tablet (150 mg total) by mouth 2 (two) times daily. 60 tablet 5   Facility-Administered Medications Prior to Visit  Medication Dose Route Frequency Provider Last Rate Last Dose   etomidate (AMIDATE) injection    Anesthesia Intra-op Myna Bright, CRNA   16 mg at 07/10/18 2031   rocuronium bromide 10 mg/mL (PF) syringe   Intravenous Anesthesia Intra-op Myna Bright, CRNA   50 mg at 07/10/18 2031    PAST MEDICAL HISTORY: Past Medical History:  Diagnosis Date   Anemia    Anxiety    Aortic valve stenosis    Arthritis    Asthma    per pt hx   Dyspnea    End stage renal disease on home HD 07/10/2011   Started HD in September 2012 at [facility] with a tunneled HD catheter, now on home HD with NxtStage. Dialyzing through AVF L lower arm with buttonhole technique as of mid 2014. His brother does the HD  treatments at home.  They are roommates for 23 years.  The brother works 3rd shift and gets off about 8am and then puts Mr Heidecker on HD in the morning after getting home. Most of the time he does HD about 4 times a week, for about 4 hours per treatment. Cause of ESRD was HTN according to patient. He says he let his health go and ending up with complications, and that he didn't like seeing doctors in those days.  He says he was diagnosed with severe HTN when he lived in [place] in his 51's.    Hepatitis B carrier ([place])    HIV infection ([place])    Hypertension    Hyperthyroidism    normal now   Pneumonia several yrs ago   Seizures ([place]) 06/02/2011   had a mild one in Sept. 2019   Sleep apnea    to start Cpap soon   Thrombocytopenia ([place])     PAST SURGICAL HISTORY: Past Surgical History:  Procedure Laterality Date   AORTIC VALVE REPLACEMENT N/A 07/28/2018   Procedure: AORTIC VALVE REPLACEMENT (AVR) using Inspiris Resilia  size 23 Aortic Valve.;  Surgeon: [name], MD;  Location: [place];  Service: Open Heart Surgery;  Laterality: N/A;   AV FISTULA PLACEMENT  06/02/11   Left radiocephalic AVF   BIOPSY  03/20/1600   Procedure: BIOPSY;  Surgeon: Milus Banister, MD;  Location: [facility];  Service: Endoscopy;;   CHEST TUBE INSERTION Left 11/04/2018   Procedure: Chest Tube Insertion;  Surgeon: [name], MD;  Location: [place];  Service: Thoracic;  Laterality: Left;   COLONOSCOPY     COLONOSCOPY WITH PROPOFOL N/A 01/27/2018   Procedure: COLONOSCOPY WITH PROPOFOL;  Surgeon: [name], MD;  Location: [facility] ENDOSCOPY;  Service: Gastroenterology;  Laterality: N/A;   CORONARY ANGIOPLASTY     DECORTICATION Left 11/04/2018   Procedure: Left Lung DECORTICATION;  Surgeon: [name], MD;  Location: [place];  Service: Thoracic;  Laterality: Left;   ERCP  03/21/2018   AT [place]   ESOPHAGOGASTRODUODENOSCOPY N/A 02/17/2018   Procedure:  ESOPHAGOGASTRODUODENOSCOPY (EGD);  Surgeon: Milus Banister, MD;  Location: [facility];  Service: Endoscopy;  Laterality: N/A;   ESOPHAGOGASTRODUODENOSCOPY (EGD) WITH PROPOFOL N/A 01/27/2018   Procedure: ESOPHAGOGASTRODUODENOSCOPY (EGD) WITH PROPOFOL;  Surgeon: [name], MD;  Location: [facility] ENDOSCOPY;  Service: Gastroenterology;  Laterality: N/A;   ESOPHAGOGASTRODUODENOSCOPY (EGD) WITH PROPOFOL N/A 04/03/2018  Procedure: ESOPHAGOGASTRODUODENOSCOPY (EGD) WITH PROPOFOL;  Surgeon: Clarene Essex, MD;  Location: [place];  Service: Endoscopy;  Laterality: N/A;   EUS N/A 02/17/2018   Procedure: UPPER ENDOSCOPIC ULTRASOUND (EUS) RADIAL;  Surgeon: Milus Banister, MD;  [facility];  Service: Endoscopy;  Laterality: N/A;   EYE SURGERY Left 05/2018   cataract surgery    fistulaogram     x 2 last 2 years   IR DIALY SHUNT INTRO NEEDLE/INTRACATH INITIAL W/IMG LEFT Left 04/20/2018   IR FLUORO GUIDE CV LINE RIGHT  09/16/2017   IR THORACENTESIS ASP PLEURAL SPACE W/IMG GUIDE  10/04/2018   IR US GUIDE VASC ACCESS RIGHT  09/16/2017   IRRIGATION AND DEBRIDEMENT KNEE Left 09/15/2017   Procedure: IRRIGATION AND DEBRIDEMENT KNEE; arthroscopic clean out;  Surgeon: [name], MD;  [facility];  Service: Orthopedics;  Laterality: Left;   OTHER SURGICAL HISTORY     removal temporary HD catheter    PLEURAL EFFUSION DRAINAGE Left 11/04/2018   Procedure: DRAINAGE OF PLEURAL EFFUSION;  Surgeon: [name], MD;  Location: [place];  Service: Thoracic;  Laterality: Left;   REVISON OF ARTERIOVENOUS FISTULA Left 10/10/2015   Procedure: REVISON OF LEFT RADIOCEPHALIC ARTERIOVENOUS FISTULA;  Surgeon: Angelia Mould, MD;  Location: [place];  Service: Vascular;  Laterality: Left;   REVISON OF ARTERIOVENOUS FISTULA Left 02/07/2016   Procedure: REPAIR OF PSEUDO-ANEUREYSM OF LEFT ARM  ARTERIOVENOUS FISTULA;  Surgeon: Angelia Mould, MD;  Location: [place];  Service: Vascular;   Laterality: Left;   REVISON OF ARTERIOVENOUS FISTULA Left 9/32/6712   Procedure: PLICATION OF LEFT ARM RADIOCEPHALIC ARTERIOVENOUS FISTULA PSEUDOANEURYSM;  Surgeon: Angelia Mould, MD;  Location: [place];  Service: Vascular;  Laterality: Left;   REVISON OF ARTERIOVENOUS FISTULA Left 07/04/2018   Procedure: REVISION OF ARTERIOVENOUS RADIOCEPHALIC FISTULA;  Surgeon: Angelia Mould, MD;  Location: [place];  Service: Vascular;  Laterality: Left;   RIGHT/LEFT HEART CATH AND CORONARY ANGIOGRAPHY N/A 02/17/2017   Procedure: Right/Left Heart Cath and Coronary Angiography;  Surgeon: Sherren Mocha, MD;  Location: [place] CV LAB;  Service: Cardiovascular;  Laterality: N/A;   RIGHT/LEFT HEART CATH AND CORONARY ANGIOGRAPHY N/A 07/25/2018   Procedure: RIGHT/LEFT HEART CATH AND CORONARY ANGIOGRAPHY;  Surgeon: Leonie Man, MD;  Location: [place] CV LAB;  Service: Cardiovascular;  Laterality: N/A;   TEE WITHOUT CARDIOVERSION N/A 09/11/2016   Procedure: TRANSESOPHAGEAL ECHOCARDIOGRAM (TEE);  Surgeon: Dorothy Spark, MD;  Location: [place];  Service: Cardiovascular;  Laterality: N/A;   TEE WITHOUT CARDIOVERSION N/A 05/05/2018   Procedure: TRANSESOPHAGEAL ECHOCARDIOGRAM (TEE);  Surgeon: Adrian Prows, MD;  Location: [place];  Service: Cardiovascular;  Laterality: N/A;  Prefer after 3:30 PM   TEE WITHOUT CARDIOVERSION N/A 07/28/2018   Procedure: TRANSESOPHAGEAL ECHOCARDIOGRAM (TEE);  Surgeon: [name], MD;  Location: [place];  Service: Open Heart Surgery;  Laterality: N/A;   VIDEO ASSISTED THORACOSCOPY Left 11/04/2018   Procedure: VIDEO ASSISTED THORACOSCOPY;  Surgeon: [name], MD;  Location: [facility] OR;  Service: Thoracic;  Laterality: Left;   VIDEO ASSISTED THORACOSCOPY (VATS)/THOROCOTOMY Left 11/04/2018    FAMILY HISTORY: Family History  Problem Relation Age of Onset   Hypertension Mother    Diabetes Mother    Cancer Father     SOCIAL  HISTORY: Social History   Socioeconomic History   Marital status: Single    Spouse name: Not on file   Number of children: Not on file   Years of education: Not on  file   Highest education level: Not on file  Occupational History   Not on file  Social Needs   Financial resource strain: Not on file   Food insecurity    Worry: Not on file    Inability: Not on file   Transportation needs    Medical: Not on file    Non-medical: Not on file  Tobacco Use   Smoking status: Former Smoker    Years: 10.00    Types: Cigarettes    Quit date: 08/07/2012    Years since quitting: 6.6   Smokeless tobacco: Never Used   Tobacco comment: casual smoking for 10 years  Substance and Sexual Activity   Alcohol use: No    Alcohol/week: 0.0 standard drinks    Comment: quit 2004   Drug use: No    Comment: uds (+) cocaine in 08/2011 but pt states was taking sudafed at the time   Sexual activity: Not Currently    Partners: Male    Comment: offered condoms  Lifestyle   Physical activity    Days per week: Not on file    Minutes per session: Not on file   Stress: Not on file  Relationships   Social connections    Talks on phone: Not on file    Gets together: Not on file    Attends religious service: Not on file    Active member of club or organization: Not on file    Attends meetings of clubs or organizations: Not on file    Relationship status: Not on file   Intimate partner violence    Fear of current or ex partner: Not on file    Emotionally abused: Not on file    Physically abused: Not on file    Forced sexual activity: Not on file  Other Topics Concern   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  Generalized: Well developed, in no acute distress   Neurological examination  Mentation: Alert oriented to time, place, history taking. Follows all commands speech and language fluent Cranial nerve II-XII: Pupils were equal round reactive to light. Extraocular movements were full, visual field were full on confrontational test. Facial sensation and strength were normal. Uvula tongue midline. Head turning and shoulder shrug  were normal and symmetric. Motor: The motor testing reveals 5 over 5 strength of all extremities, 3/5 LUE, decreased ROM left shoulder, Good symmetric motor tone is noted throughout.  Sensory: Sensory testing is intact to soft touch on all 4 extremities. No evidence of extinction is noted.  Coordination: Cerebellar testing reveals good finger-nose-finger and heel-to-shin bilaterally.  Gait and station: Gait is normal. Tandem gait is normal.  Reflexes: Deep tendon reflexes are symmetric and normal bilaterally.   DIAGNOSTIC DATA (LABS, IMAGING, TESTING) - I reviewed patient records, labs, notes, testing and imaging myself where available.  Lab Results  Component Value Date   WBC 6.0 11/11/2018   HGB 7.8 (L) 11/11/2018   HCT 25.7 (L) 11/11/2018   MCV 94.8 11/11/2018  PLT 200 11/11/2018      Component Value Date/Time   NA 134 (L) 11/11/2018 0749   NA 136 11/18/2016 1036   K 4.3 11/11/2018 0749   K 3.3 (L) 11/18/2016 1036   CL 95 (L) 11/11/2018 0749   CO2 28 11/11/2018 0749   CO2 29 11/18/2016 1036   GLUCOSE 81 11/11/2018 0749   GLUCOSE 95 11/18/2016 1036   BUN 36 (H) 11/11/2018 0749   BUN 25.7 11/18/2016 1036   CREATININE 7.06 (H) 11/11/2018 0749   CREATININE 10.64 (H) 11/01/2017 1008   CREATININE 6.6 (HH) 11/18/2016 1036   CALCIUM 8.9 11/11/2018 0749   CALCIUM 9.5 11/18/2016 1036   PROT 6.5 11/06/2018 0238   PROT 7.4 11/18/2016 1036   PROT 7.9 11/18/2016 1036   ALBUMIN  2.6 (L) 11/11/2018 0749   ALBUMIN 3.9 11/18/2016 1036   AST 14 (L) 11/06/2018 0238   AST 15 11/18/2016 1036   ALT 9 11/06/2018 0238   ALT <6 11/18/2016 1036   ALKPHOS 61 11/06/2018 0238   ALKPHOS 68 11/18/2016 1036   BILITOT 0.6 11/06/2018 0238   BILITOT 0.69 11/18/2016 1036   GFRNONAA 7 (L) 11/11/2018 0749   GFRNONAA 3 (L) 02/10/2017 1619   GFRAA 9 (L) 11/11/2018 0749   GFRAA <4 (L) 02/10/2017 1619   Lab Results  Component Value Date   CHOL 98 07/14/2018   HDL 35 (L) 07/14/2018   LDLCALC 49 07/14/2018   TRIG 77 07/16/2018   CHOLHDL 2.8 07/14/2018   Lab Results  Component Value Date   HGBA1C 4.2 (L) 07/26/2018   Lab Results  Component Value Date   VITAMINB12 393 07/10/2018   Lab Results  Component Value Date   TSH 4.286 07/10/2018      ASSESSMENT AND PLAN 63 y.o. year old male  has a past medical history of Anemia, Anxiety, Aortic valve stenosis, Arthritis, Asthma, Dyspnea, End stage renal disease on home HD (07/10/2011), Hepatitis B carrier ([place]), HIV infection ([place]), Hypertension, Hyperthyroidism, Pneumonia (several yrs ago), Seizures ([place]) (06/02/2011), Sleep apnea, and Thrombocytopenia ([place]). here with:  ESRD on hemodialysis Seizure -Continue Vimpat 150 mg twice daily, he is tolerating well without side effect. -No recurrent seizures -EEG has showed generalized slowing -He will follow-up in 6 months or sooner if needed -BP is elevated today, I have advised him to closely monitor his blood pressure at home, they have clonidine to take as needed, take as recommended, if no better in a few hours contact primary care for next steps.  At this time, he is not symptomatic.  I spent 15 minutes with the patient. 50% of this time was spent discussing his plan of care.    Butler Denmark, AGNP-C, DNP 04/06/2019, 11:11 AM [facility] [address], [place], [place] [address] [phone]

## 2019-04-06 NOTE — Patient Instructions (Signed)
Please check your BP at home and take clonidine as recommended. If sustained high blood pressure, please call your primary care doctor or go to the ED if it continues.

## 2019-04-06 NOTE — Progress Notes (Signed)
I have reviewed and agreed above plan. 

## 2019-04-07 DIAGNOSIS — D631 Anemia in chronic kidney disease: Secondary | ICD-10-CM | POA: Diagnosis not present

## 2019-04-07 DIAGNOSIS — Z992 Dependence on renal dialysis: Secondary | ICD-10-CM | POA: Diagnosis not present

## 2019-04-07 DIAGNOSIS — N186 End stage renal disease: Secondary | ICD-10-CM | POA: Diagnosis not present

## 2019-04-07 DIAGNOSIS — D509 Iron deficiency anemia, unspecified: Secondary | ICD-10-CM | POA: Diagnosis not present

## 2019-04-07 DIAGNOSIS — N2581 Secondary hyperparathyroidism of renal origin: Secondary | ICD-10-CM | POA: Diagnosis not present

## 2019-04-10 DIAGNOSIS — N186 End stage renal disease: Secondary | ICD-10-CM | POA: Diagnosis not present

## 2019-04-10 DIAGNOSIS — D509 Iron deficiency anemia, unspecified: Secondary | ICD-10-CM | POA: Diagnosis not present

## 2019-04-10 DIAGNOSIS — D631 Anemia in chronic kidney disease: Secondary | ICD-10-CM | POA: Diagnosis not present

## 2019-04-10 DIAGNOSIS — N2581 Secondary hyperparathyroidism of renal origin: Secondary | ICD-10-CM | POA: Diagnosis not present

## 2019-04-10 DIAGNOSIS — Z992 Dependence on renal dialysis: Secondary | ICD-10-CM | POA: Diagnosis not present

## 2019-04-11 DIAGNOSIS — D509 Iron deficiency anemia, unspecified: Secondary | ICD-10-CM | POA: Diagnosis not present

## 2019-04-11 DIAGNOSIS — Z992 Dependence on renal dialysis: Secondary | ICD-10-CM | POA: Diagnosis not present

## 2019-04-11 DIAGNOSIS — N2581 Secondary hyperparathyroidism of renal origin: Secondary | ICD-10-CM | POA: Diagnosis not present

## 2019-04-11 DIAGNOSIS — N186 End stage renal disease: Secondary | ICD-10-CM | POA: Diagnosis not present

## 2019-04-11 DIAGNOSIS — D631 Anemia in chronic kidney disease: Secondary | ICD-10-CM | POA: Diagnosis not present

## 2019-04-13 DIAGNOSIS — Z992 Dependence on renal dialysis: Secondary | ICD-10-CM | POA: Diagnosis not present

## 2019-04-13 DIAGNOSIS — N186 End stage renal disease: Secondary | ICD-10-CM | POA: Diagnosis not present

## 2019-04-13 DIAGNOSIS — N2581 Secondary hyperparathyroidism of renal origin: Secondary | ICD-10-CM | POA: Diagnosis not present

## 2019-04-13 DIAGNOSIS — D509 Iron deficiency anemia, unspecified: Secondary | ICD-10-CM | POA: Diagnosis not present

## 2019-04-13 DIAGNOSIS — D631 Anemia in chronic kidney disease: Secondary | ICD-10-CM | POA: Diagnosis not present

## 2019-04-14 DIAGNOSIS — H5213 Myopia, bilateral: Secondary | ICD-10-CM | POA: Diagnosis not present

## 2019-04-14 DIAGNOSIS — M47817 Spondylosis without myelopathy or radiculopathy, lumbosacral region: Secondary | ICD-10-CM | POA: Diagnosis not present

## 2019-04-14 DIAGNOSIS — J449 Chronic obstructive pulmonary disease, unspecified: Secondary | ICD-10-CM | POA: Diagnosis not present

## 2019-04-14 DIAGNOSIS — D509 Iron deficiency anemia, unspecified: Secondary | ICD-10-CM | POA: Diagnosis not present

## 2019-04-14 DIAGNOSIS — B20 Human immunodeficiency virus [HIV] disease: Secondary | ICD-10-CM | POA: Diagnosis not present

## 2019-04-14 DIAGNOSIS — Z992 Dependence on renal dialysis: Secondary | ICD-10-CM | POA: Diagnosis not present

## 2019-04-14 DIAGNOSIS — Z7982 Long term (current) use of aspirin: Secondary | ICD-10-CM | POA: Diagnosis not present

## 2019-04-14 DIAGNOSIS — H2510 Age-related nuclear cataract, unspecified eye: Secondary | ICD-10-CM | POA: Diagnosis not present

## 2019-04-14 DIAGNOSIS — I120 Hypertensive chronic kidney disease with stage 5 chronic kidney disease or end stage renal disease: Secondary | ICD-10-CM | POA: Diagnosis not present

## 2019-04-14 DIAGNOSIS — I2510 Atherosclerotic heart disease of native coronary artery without angina pectoris: Secondary | ICD-10-CM | POA: Diagnosis not present

## 2019-04-14 DIAGNOSIS — G40909 Epilepsy, unspecified, not intractable, without status epilepticus: Secondary | ICD-10-CM | POA: Diagnosis not present

## 2019-04-14 DIAGNOSIS — Z952 Presence of prosthetic heart valve: Secondary | ICD-10-CM | POA: Diagnosis not present

## 2019-04-14 DIAGNOSIS — F418 Other specified anxiety disorders: Secondary | ICD-10-CM | POA: Diagnosis not present

## 2019-04-14 DIAGNOSIS — D631 Anemia in chronic kidney disease: Secondary | ICD-10-CM | POA: Diagnosis not present

## 2019-04-14 DIAGNOSIS — I340 Nonrheumatic mitral (valve) insufficiency: Secondary | ICD-10-CM | POA: Diagnosis not present

## 2019-04-14 DIAGNOSIS — Z9181 History of falling: Secondary | ICD-10-CM | POA: Diagnosis not present

## 2019-04-14 DIAGNOSIS — N186 End stage renal disease: Secondary | ICD-10-CM | POA: Diagnosis not present

## 2019-04-14 DIAGNOSIS — D696 Thrombocytopenia, unspecified: Secondary | ICD-10-CM | POA: Diagnosis not present

## 2019-04-14 DIAGNOSIS — N2581 Secondary hyperparathyroidism of renal origin: Secondary | ICD-10-CM | POA: Diagnosis not present

## 2019-04-14 DIAGNOSIS — G4733 Obstructive sleep apnea (adult) (pediatric): Secondary | ICD-10-CM | POA: Diagnosis not present

## 2019-04-14 DIAGNOSIS — E440 Moderate protein-calorie malnutrition: Secondary | ICD-10-CM | POA: Diagnosis not present

## 2019-04-17 DIAGNOSIS — D509 Iron deficiency anemia, unspecified: Secondary | ICD-10-CM | POA: Diagnosis not present

## 2019-04-17 DIAGNOSIS — N2581 Secondary hyperparathyroidism of renal origin: Secondary | ICD-10-CM | POA: Diagnosis not present

## 2019-04-17 DIAGNOSIS — D631 Anemia in chronic kidney disease: Secondary | ICD-10-CM | POA: Diagnosis not present

## 2019-04-17 DIAGNOSIS — N186 End stage renal disease: Secondary | ICD-10-CM | POA: Diagnosis not present

## 2019-04-17 DIAGNOSIS — Z992 Dependence on renal dialysis: Secondary | ICD-10-CM | POA: Diagnosis not present

## 2019-04-18 ENCOUNTER — Other Ambulatory Visit: Payer: Self-pay | Admitting: Internal Medicine

## 2019-04-18 DIAGNOSIS — N186 End stage renal disease: Secondary | ICD-10-CM | POA: Diagnosis not present

## 2019-04-18 DIAGNOSIS — D631 Anemia in chronic kidney disease: Secondary | ICD-10-CM | POA: Diagnosis not present

## 2019-04-18 DIAGNOSIS — N2581 Secondary hyperparathyroidism of renal origin: Secondary | ICD-10-CM | POA: Diagnosis not present

## 2019-04-18 DIAGNOSIS — D509 Iron deficiency anemia, unspecified: Secondary | ICD-10-CM | POA: Diagnosis not present

## 2019-04-18 DIAGNOSIS — Z992 Dependence on renal dialysis: Secondary | ICD-10-CM | POA: Diagnosis not present

## 2019-04-18 MED ORDER — CLONIDINE 0.3 MG/24HR TD PTWK: 0.3000 mg | MEDICATED_PATCH | TRANSDERMAL | 1 refills | Status: AC

## 2019-04-18 NOTE — Telephone Encounter (Signed)
Need refill on cloNIDine (CATAPRES - DOSED IN MG/24 HR) 0.3 mg/24hr patch  ;pt contact Jeffrey [name], Jeffrey [name] - [address] AT [place]   Normally pt would get 3 boxes at one time, this time he only was given 1 box

## 2019-04-19 ENCOUNTER — Encounter: Payer: Self-pay | Admitting: Family Medicine

## 2019-04-19 ENCOUNTER — Ambulatory Visit (INDEPENDENT_AMBULATORY_CARE_PROVIDER_SITE_OTHER): Payer: Medicare Other | Admitting: Family Medicine

## 2019-04-19 ENCOUNTER — Other Ambulatory Visit: Payer: Self-pay

## 2019-04-19 DIAGNOSIS — M19019 Primary osteoarthritis, unspecified shoulder: Secondary | ICD-10-CM

## 2019-04-19 DIAGNOSIS — I120 Hypertensive chronic kidney disease with stage 5 chronic kidney disease or end stage renal disease: Secondary | ICD-10-CM | POA: Diagnosis not present

## 2019-04-19 DIAGNOSIS — G8929 Other chronic pain: Secondary | ICD-10-CM

## 2019-04-19 DIAGNOSIS — D631 Anemia in chronic kidney disease: Secondary | ICD-10-CM | POA: Diagnosis not present

## 2019-04-19 DIAGNOSIS — M25512 Pain in left shoulder: Secondary | ICD-10-CM | POA: Diagnosis not present

## 2019-04-19 DIAGNOSIS — M47817 Spondylosis without myelopathy or radiculopathy, lumbosacral region: Secondary | ICD-10-CM | POA: Diagnosis not present

## 2019-04-19 DIAGNOSIS — J449 Chronic obstructive pulmonary disease, unspecified: Secondary | ICD-10-CM | POA: Diagnosis not present

## 2019-04-19 DIAGNOSIS — I2510 Atherosclerotic heart disease of native coronary artery without angina pectoris: Secondary | ICD-10-CM

## 2019-04-19 DIAGNOSIS — N186 End stage renal disease: Secondary | ICD-10-CM | POA: Diagnosis not present

## 2019-04-19 NOTE — Patient Instructions (Signed)
We could repeat the shot on or after September 25th. Try boswellia extract for severe arthritis. Continue capsaicin and/or biofreeze topically. Tylenol as needed. Use sling if needed for severe pain. Do motion exercises as you have been (arm swings, pendulums). Follow up with me around that time (9/25).

## 2019-04-19 NOTE — Progress Notes (Signed)
HPI: Pt is here for follow-up of chronic left shoulder pain.  1 month ago he was seen and had a steroid injection into the shoulder.  He noticed benefit from the injection, but unfortunately had a fall onto that shoulder about 3 weeks ago.  After the fall symptoms arose similar to those before the shot.  He has started his pendulum exercises and is able to use a 3 pound weight in his hand at home.  He continues to use Voltaren gel, KT tape, and Tylenol for pain relief.   Past Medical History:  Diagnosis Date  . Anemia   . Anxiety   . Aortic valve stenosis   . Arthritis   . Asthma    per pt hx  . Dyspnea   . End stage renal disease on home HD 07/10/2011   Started HD in September 2012 at [facility] with a tunneled HD catheter, now on home HD with NxtStage. Dialyzing through AVF L lower arm with buttonhole technique as of mid 2014. His brother does the HD treatments at home.  They are roommates for 23 years.  The brother works 3rd shift and gets off about 8am and then puts Mr Meuth on HD in the morning after getting home. Most of the time he does HD about 4 times a week, for about 4 hours per treatment. Cause of ESRD was HTN according to patient. He says he let his health go and ending up with complications, and that he didn't like seeing doctors in those days.  He says he was diagnosed with severe HTN when he lived in [place] in his 62's.   . Hepatitis B carrier ([place])   . HIV infection ([place])   . Hypertension   . Hyperthyroidism    normal now  . Pneumonia several yrs ago  . Seizures ([place]) 06/02/2011   had a mild one in Sept. 2019  . Sleep apnea    to start Cpap soon  . Thrombocytopenia ([place])     Current Outpatient Medications on File Prior to Visit  Medication Sig Dispense Refill  . acetaminophen (TYLENOL) 325 MG tablet Take 2 tablets (650 mg total) by mouth every 6 (six) hours as needed for fever, headache, mild pain or moderate pain.    Marland Kitchen albuterol (PROVENTIL) (2.5 MG/3ML) 0.083% nebulizer  solution Take 3 mLs (2.5 mg total) by nebulization every 6 (six) hours as needed for wheezing or shortness of breath. 360 mL 0  . Amino Acids-Protein Hydrolys (FEEDING SUPPLEMENT, PRO-STAT SUGAR FREE 64,) LIQD Take 30 mLs by mouth daily.    Marland Kitchen amLODipine (NORVASC) 10 MG tablet Take 1 tablet (10 mg total) by mouth daily. 90 tablet 1  . aspirin EC 81 MG tablet Take 81 mg by mouth daily.    Marland Kitchen BIKTARVY 50-200-25 MG TABS tablet TAKE 1 TABLET BY MOUTH DAILY 30 tablet 5  . cloNIDine (CATAPRES - DOSED IN MG/24 HR) 0.3 mg/24hr patch Place 1 patch (0.3 mg total) onto the skin once a week. 14 patch 1  . diclofenac sodium (VOLTAREN) 1 % GEL Apply 2 g topically 4 (four) times daily as needed (Pain). 50 g 1  . Epoetin Alfa (EPOGEN IJ) Inject as directed. THREE TIMES A WEEK AT DIALYSIS    . ferric gluconate 125 mg in sodium chloride 0.9 % 100 mL Inject 125 mg into the vein every Monday, Wednesday, and Friday with hemodialysis.    . hydrALAZINE (APRESOLINE) 100 MG tablet Take 1 tablet (100 mg total) by mouth  3 (three) times daily. 90 tablet 1  . iron sucrose (VENOFER) 20 MG/ML injection Inject 500 mg into the vein every 14 (fourteen) days.    . isosorbide mononitrate (ISMO,MONOKET) 20 MG tablet Take 1 tablet (20 mg total) by mouth 2 (two) times daily at 10 AM and 5 PM. 180 tablet 1  . Lacosamide (VIMPAT) 150 MG TABS Take 1 tablet (150 mg total) by mouth 2 (two) times daily. 60 tablet 5  . lactulose (CHRONULAC) 10 GM/15ML solution Take 30 mLs (20 g total) by mouth daily as needed for moderate constipation. 473 mL 1  . losartan (COZAAR) 25 MG tablet Take 25 mg by mouth daily.    . multivitamin (RENA-VIT) TABS tablet Take 1 tablet by mouth daily. 30 tablet 0  . NONFORMULARY OR COMPOUNDED ITEM [facility] Terbinafine 3% Fluconazole 2% Tea Tree Oil 5% Urea 10% Ibuprofen 2% in DMSO suspension #77mL JHE:RDEYC to the affected nail(S) once (at bedtime) Antifungal 30 each 5  . omeprazole (PRILOSEC) 20 MG capsule Take  1 capsule (20 mg total) by mouth daily. 90 capsule 1  . traMADol (ULTRAM) 50 MG tablet Take 1 tablet (50 mg total) by mouth every 12 (twelve) hours as needed. 14 tablet 0  . traZODone (DESYREL) 50 MG tablet TAKE 1 TABLET(50 MG) BY MOUTH AT BEDTIME AS NEEDED FOR SLEEP 30 tablet 5   Current Facility-Administered Medications on File Prior to Visit  Medication Dose Route Frequency Provider Last Rate Last Dose  . etomidate (AMIDATE) injection    Anesthesia Intra-op Myna Bright, CRNA   16 mg at 07/10/18 2031  . rocuronium bromide 10 mg/mL (PF) syringe   Intravenous Anesthesia Intra-op Myna Bright, CRNA   50 mg at 07/10/18 2031    Past Surgical History:  Procedure Laterality Date  . AORTIC VALVE REPLACEMENT N/A 07/28/2018   Procedure: AORTIC VALVE REPLACEMENT (AVR) using Inspiris Resilia  size 23 Aortic Valve.;  Surgeon: [name], MD;  Location: [place];  Service: Open Heart Surgery;  Laterality: N/A;  . AV FISTULA PLACEMENT  06/02/11   Left radiocephalic AVF  . BIOPSY  02/17/2018   Procedure: BIOPSY;  Surgeon: Milus Banister, MD;  Location: [facility];  Service: Endoscopy;;  . CHEST TUBE INSERTION Left 11/04/2018   Procedure: Chest Tube Insertion;  Surgeon: [name], MD;  Location: [place];  Service: Thoracic;  Laterality: Left;  . COLONOSCOPY    . COLONOSCOPY WITH PROPOFOL N/A 01/27/2018   Procedure: COLONOSCOPY WITH PROPOFOL;  Surgeon: [name], MD;  Location: [facility] ENDOSCOPY;  Service: Gastroenterology;  Laterality: N/A;  . CORONARY ANGIOPLASTY    . DECORTICATION Left 11/04/2018   Procedure: Left Lung DECORTICATION;  Surgeon: [name], MD;  Location: [place];  Service: Thoracic;  Laterality: Left;  . ERCP  03/21/2018   AT [place]  . ESOPHAGOGASTRODUODENOSCOPY N/A 02/17/2018   Procedure: ESOPHAGOGASTRODUODENOSCOPY (EGD);  Surgeon: Milus Banister, MD;  Location: [facility];  Service: Endoscopy;  Laterality: N/A;  .  ESOPHAGOGASTRODUODENOSCOPY (EGD) WITH PROPOFOL N/A 01/27/2018   Procedure: ESOPHAGOGASTRODUODENOSCOPY (EGD) WITH PROPOFOL;  Surgeon: [name], MD;  Location: [facility] ENDOSCOPY;  Service: Gastroenterology;  Laterality: N/A;  . ESOPHAGOGASTRODUODENOSCOPY (EGD) WITH PROPOFOL N/A 04/03/2018   Procedure: ESOPHAGOGASTRODUODENOSCOPY (EGD) WITH PROPOFOL;  Surgeon: Clarene Essex, MD;  Location: [place];  Service: Endoscopy;  Laterality: N/A;  . EUS N/A 02/17/2018   Procedure: UPPER ENDOSCOPIC ULTRASOUND (EUS) RADIAL;  Surgeon: Milus Banister, MD;  Location: [facility] ENDOSCOPY;  Service:  Endoscopy;  Laterality: N/A;  . EYE SURGERY Left 05/2018   cataract surgery   . fistulaogram     x 2 last 2 years  . IR DIALY SHUNT INTRO NEEDLE/INTRACATH INITIAL W/IMG LEFT Left 04/20/2018  . IR FLUORO GUIDE CV LINE RIGHT  09/16/2017  . IR THORACENTESIS ASP PLEURAL SPACE W/IMG GUIDE  10/04/2018  . IR US GUIDE VASC ACCESS RIGHT  09/16/2017  . IRRIGATION AND DEBRIDEMENT KNEE Left 09/15/2017   Procedure: IRRIGATION AND DEBRIDEMENT KNEE; arthroscopic clean out;  Surgeon: [name], MD;  Location: [facility];  Service: Orthopedics;  Laterality: Left;  . OTHER SURGICAL HISTORY     removal temporary HD catheter   . PLEURAL EFFUSION DRAINAGE Left 11/04/2018   Procedure: DRAINAGE OF PLEURAL EFFUSION;  Surgeon: [name], MD;  Location: [place];  Service: Thoracic;  Laterality: Left;  . REVISON OF ARTERIOVENOUS FISTULA Left 10/10/2015   Procedure: REVISON OF LEFT RADIOCEPHALIC ARTERIOVENOUS FISTULA;  Surgeon: Angelia Mould, MD;  Location: [place];  Service: Vascular;  Laterality: Left;  . REVISON OF ARTERIOVENOUS FISTULA Left 02/07/2016   Procedure: REPAIR OF PSEUDO-ANEUREYSM OF LEFT ARM  ARTERIOVENOUS FISTULA;  Surgeon: Angelia Mould, MD;  Location: [place];  Service: Vascular;  Laterality: Left;  . REVISON OF ARTERIOVENOUS FISTULA Left 2/35/3614   Procedure: PLICATION OF LEFT ARM RADIOCEPHALIC ARTERIOVENOUS  FISTULA PSEUDOANEURYSM;  Surgeon: Angelia Mould, MD;  Location: [place];  Service: Vascular;  Laterality: Left;  . REVISON OF ARTERIOVENOUS FISTULA Left 07/04/2018   Procedure: REVISION OF ARTERIOVENOUS RADIOCEPHALIC FISTULA;  Surgeon: Angelia Mould, MD;  Location: [place];  Service: Vascular;  Laterality: Left;  . RIGHT/LEFT HEART CATH AND CORONARY ANGIOGRAPHY N/A 02/17/2017   Procedure: Right/Left Heart Cath and Coronary Angiography;  Surgeon: Sherren Mocha, MD;  Location: [place] CV LAB;  Service: Cardiovascular;  Laterality: N/A;  . RIGHT/LEFT HEART CATH AND CORONARY ANGIOGRAPHY N/A 07/25/2018   Procedure: RIGHT/LEFT HEART CATH AND CORONARY ANGIOGRAPHY;  Surgeon: Leonie Man, MD;  Location: [place] CV LAB;  Service: Cardiovascular;  Laterality: N/A;  . TEE WITHOUT CARDIOVERSION N/A 09/11/2016   Procedure: TRANSESOPHAGEAL ECHOCARDIOGRAM (TEE);  Surgeon: Dorothy Spark, MD;  Location: [place];  Service: Cardiovascular;  Laterality: N/A;  . TEE WITHOUT CARDIOVERSION N/A 05/05/2018   Procedure: TRANSESOPHAGEAL ECHOCARDIOGRAM (TEE);  Surgeon: Adrian Prows, MD;  Location: [place];  Service: Cardiovascular;  Laterality: N/A;  Prefer after 3:30 PM  . TEE WITHOUT CARDIOVERSION N/A 07/28/2018   Procedure: TRANSESOPHAGEAL ECHOCARDIOGRAM (TEE);  Surgeon: [name], MD;  Location: [place];  Service: Open Heart Surgery;  Laterality: N/A;  . VIDEO ASSISTED THORACOSCOPY Left 11/04/2018   Procedure: VIDEO ASSISTED THORACOSCOPY;  Surgeon: [name], MD;  Location: [place];  Service: Thoracic;  Laterality: Left;  Marland Kitchen VIDEO ASSISTED THORACOSCOPY (VATS)/THOROCOTOMY Left 11/04/2018    Allergies  Allergen Reactions  . Lisinopril Anaphylaxis and Shortness Of Breath    Throat swelling  . Penicillins Anaphylaxis and Other (See Comments)    Childhood allergy Has patient had a PCN reaction causing immediate rash, facial/tongue/throat swelling, SOB or  lightheadedness with hypotension: Yes Has patient had a PCN reaction causing severe rash involving mucus membranes or skin necrosis: Unk Has patient had a PCN reaction that required hospitalization: Unk Has patient had a PCN reaction occurring within the last 10 years: No If all of the above answers are "NO", then may proceed with Cephalosporin use.   . Fentanyl Other (See Comments)  Lethargy, AMS  . Morphine And Related Other (See Comments)    "Not Himself"    Social History   Socioeconomic History  . Marital status: Single    Spouse name: Not on file  . Number of children: Not on file  . Years of education: Not on file  . Highest education level: Not on file  Occupational History  . Not on file  Social Needs  . Financial resource strain: Not on file  . Food insecurity    Worry: Not on file    Inability: Not on file  . Transportation needs    Medical: Not on file    Non-medical: Not on file  Tobacco Use  . Smoking status: Former Smoker    Years: 10.00    Types: Cigarettes    Quit date: 08/07/2012    Years since quitting: 6.7  . Smokeless tobacco: Never Used  . Tobacco comment: casual smoking for 10 years  Substance and Sexual Activity  . Alcohol use: No    Alcohol/week: 0.0 standard drinks    Comment: quit 2004  . Drug use: No    Comment: uds (+) cocaine in 08/2011 but pt states was taking sudafed at the time  . Sexual activity: Not Currently    Partners: Male    Comment: offered condoms  Lifestyle  . Physical activity    Days per week: Not on file    Minutes per session: Not on file  . Stress: Not on file  Relationships  . Social Herbalist on phone: Not on file    Gets together: Not on file    Attends religious service: Not on file    Active member of club or organization: Not on file    Attends meetings of clubs or organizations: Not on file    Relationship status: Not on file  . Intimate partner violence    Fear of current or ex partner:  Not on file    Emotionally abused: Not on file    Physically abused: Not on file    Forced sexual activity: Not on file  Other Topics Concern  . 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 Hypertension Mother   . Diabetes Mother   . Cancer Father     BP 117/65   Ht 6' (1.829 m)   Wt 191 lb (86.6 kg)   BMI 25.90 kg/m   ROS:  All others negative or as described in the HPI CONST: no F/C, no malaise, no fatigue MSK: See above NEURO: no numbness/tingling, no weakness SKIN: no rash, no lesions HEME: no bleeding, no bruising, no erythema  Objective: Left shoulder Well developed, well nourished, in no acute distress.  KT tape in place over lateral shoulder and AC joint.  Fistula in left forearm covered with bandage No swelling, ecchymoses.  No gross deformity. + TTP at anterior glenohumeral joint and supraspinatus insertion site. Active range of motion is only about 20 degrees in both flexion and abduction.  Patient utilizes deltoid to horizontally adduct.  Passive range of motion increases to about 35 degrees and both directions.  Laxity noted at glenohumeral joint, GH head is in place. Cannot fully assess range  of motion secondary to pain Negative Yergasons. Strength 3/5 across all 4 rotator cuff muscles NV intact distally.   Assessment and Plan:  1.  Chronic left shoulder arthritis It has only been 1 month since last CSI, I do not think patient would benefit from skilled physical therapy due to the severity of his arthritis.  Comorbidities make him a poor surgical candidate, a full joint replacement is probably what he needs.  We have provided a sling today that he can use when he is having severe pain, but advised not to being continuously to reduce chance of  frozen shoulder.  Continue at home therapy and medications.  We will follow-up in the middle of September at which point 12 weeks will have elapsed and can perform another therapeutic injection since he received benefit from the first injection.   Lanier Clam, DO, ATC Sports Medicine Fellow

## 2019-04-20 DIAGNOSIS — D631 Anemia in chronic kidney disease: Secondary | ICD-10-CM | POA: Diagnosis not present

## 2019-04-20 DIAGNOSIS — D509 Iron deficiency anemia, unspecified: Secondary | ICD-10-CM | POA: Diagnosis not present

## 2019-04-20 DIAGNOSIS — N186 End stage renal disease: Secondary | ICD-10-CM | POA: Diagnosis not present

## 2019-04-20 DIAGNOSIS — N2581 Secondary hyperparathyroidism of renal origin: Secondary | ICD-10-CM | POA: Diagnosis not present

## 2019-04-20 DIAGNOSIS — Z992 Dependence on renal dialysis: Secondary | ICD-10-CM | POA: Diagnosis not present

## 2019-04-21 DIAGNOSIS — D631 Anemia in chronic kidney disease: Secondary | ICD-10-CM | POA: Diagnosis not present

## 2019-04-21 DIAGNOSIS — N2581 Secondary hyperparathyroidism of renal origin: Secondary | ICD-10-CM | POA: Diagnosis not present

## 2019-04-21 DIAGNOSIS — D509 Iron deficiency anemia, unspecified: Secondary | ICD-10-CM | POA: Diagnosis not present

## 2019-04-21 DIAGNOSIS — N186 End stage renal disease: Secondary | ICD-10-CM | POA: Diagnosis not present

## 2019-04-21 DIAGNOSIS — Z992 Dependence on renal dialysis: Secondary | ICD-10-CM | POA: Diagnosis not present

## 2019-04-24 DIAGNOSIS — N186 End stage renal disease: Secondary | ICD-10-CM | POA: Diagnosis not present

## 2019-04-24 DIAGNOSIS — D631 Anemia in chronic kidney disease: Secondary | ICD-10-CM | POA: Diagnosis not present

## 2019-04-24 DIAGNOSIS — Z992 Dependence on renal dialysis: Secondary | ICD-10-CM | POA: Diagnosis not present

## 2019-04-24 DIAGNOSIS — D509 Iron deficiency anemia, unspecified: Secondary | ICD-10-CM | POA: Diagnosis not present

## 2019-04-24 DIAGNOSIS — N2581 Secondary hyperparathyroidism of renal origin: Secondary | ICD-10-CM | POA: Diagnosis not present

## 2019-04-25 DIAGNOSIS — Z992 Dependence on renal dialysis: Secondary | ICD-10-CM | POA: Diagnosis not present

## 2019-04-25 DIAGNOSIS — N2581 Secondary hyperparathyroidism of renal origin: Secondary | ICD-10-CM | POA: Diagnosis not present

## 2019-04-25 DIAGNOSIS — N186 End stage renal disease: Secondary | ICD-10-CM | POA: Diagnosis not present

## 2019-04-25 DIAGNOSIS — D509 Iron deficiency anemia, unspecified: Secondary | ICD-10-CM | POA: Diagnosis not present

## 2019-04-25 DIAGNOSIS — D631 Anemia in chronic kidney disease: Secondary | ICD-10-CM | POA: Diagnosis not present

## 2019-04-27 DIAGNOSIS — Z992 Dependence on renal dialysis: Secondary | ICD-10-CM | POA: Diagnosis not present

## 2019-04-27 DIAGNOSIS — N186 End stage renal disease: Secondary | ICD-10-CM | POA: Diagnosis not present

## 2019-04-27 DIAGNOSIS — N2581 Secondary hyperparathyroidism of renal origin: Secondary | ICD-10-CM | POA: Diagnosis not present

## 2019-04-27 DIAGNOSIS — D631 Anemia in chronic kidney disease: Secondary | ICD-10-CM | POA: Diagnosis not present

## 2019-04-27 DIAGNOSIS — D509 Iron deficiency anemia, unspecified: Secondary | ICD-10-CM | POA: Diagnosis not present

## 2019-04-28 DIAGNOSIS — N2581 Secondary hyperparathyroidism of renal origin: Secondary | ICD-10-CM | POA: Diagnosis not present

## 2019-04-28 DIAGNOSIS — N186 End stage renal disease: Secondary | ICD-10-CM | POA: Diagnosis not present

## 2019-04-28 DIAGNOSIS — D509 Iron deficiency anemia, unspecified: Secondary | ICD-10-CM | POA: Diagnosis not present

## 2019-04-28 DIAGNOSIS — D631 Anemia in chronic kidney disease: Secondary | ICD-10-CM | POA: Diagnosis not present

## 2019-04-28 DIAGNOSIS — Z992 Dependence on renal dialysis: Secondary | ICD-10-CM | POA: Diagnosis not present

## 2019-05-01 ENCOUNTER — Telehealth: Payer: Self-pay | Admitting: Internal Medicine

## 2019-05-01 ENCOUNTER — Other Ambulatory Visit: Payer: Self-pay | Admitting: *Deleted

## 2019-05-01 DIAGNOSIS — D509 Iron deficiency anemia, unspecified: Secondary | ICD-10-CM | POA: Diagnosis not present

## 2019-05-01 DIAGNOSIS — D631 Anemia in chronic kidney disease: Secondary | ICD-10-CM | POA: Diagnosis not present

## 2019-05-01 DIAGNOSIS — Z992 Dependence on renal dialysis: Secondary | ICD-10-CM | POA: Diagnosis not present

## 2019-05-01 DIAGNOSIS — N2581 Secondary hyperparathyroidism of renal origin: Secondary | ICD-10-CM | POA: Diagnosis not present

## 2019-05-01 DIAGNOSIS — N186 End stage renal disease: Secondary | ICD-10-CM | POA: Diagnosis not present

## 2019-05-01 NOTE — Telephone Encounter (Signed)
Pt contact pt brother [phone]

## 2019-05-01 NOTE — Telephone Encounter (Signed)
rtc to brother, wanted to update on pt, he is doing well at this time

## 2019-05-02 DIAGNOSIS — Z992 Dependence on renal dialysis: Secondary | ICD-10-CM | POA: Diagnosis not present

## 2019-05-02 DIAGNOSIS — D631 Anemia in chronic kidney disease: Secondary | ICD-10-CM | POA: Diagnosis not present

## 2019-05-02 DIAGNOSIS — D509 Iron deficiency anemia, unspecified: Secondary | ICD-10-CM | POA: Diagnosis not present

## 2019-05-02 DIAGNOSIS — N2581 Secondary hyperparathyroidism of renal origin: Secondary | ICD-10-CM | POA: Diagnosis not present

## 2019-05-02 DIAGNOSIS — N186 End stage renal disease: Secondary | ICD-10-CM | POA: Diagnosis not present

## 2019-05-03 DIAGNOSIS — I120 Hypertensive chronic kidney disease with stage 5 chronic kidney disease or end stage renal disease: Secondary | ICD-10-CM | POA: Diagnosis not present

## 2019-05-03 DIAGNOSIS — M47817 Spondylosis without myelopathy or radiculopathy, lumbosacral region: Secondary | ICD-10-CM | POA: Diagnosis not present

## 2019-05-03 DIAGNOSIS — D631 Anemia in chronic kidney disease: Secondary | ICD-10-CM | POA: Diagnosis not present

## 2019-05-03 DIAGNOSIS — N186 End stage renal disease: Secondary | ICD-10-CM | POA: Diagnosis not present

## 2019-05-03 DIAGNOSIS — I2510 Atherosclerotic heart disease of native coronary artery without angina pectoris: Secondary | ICD-10-CM | POA: Diagnosis not present

## 2019-05-03 DIAGNOSIS — J449 Chronic obstructive pulmonary disease, unspecified: Secondary | ICD-10-CM | POA: Diagnosis not present

## 2019-05-04 DIAGNOSIS — Z992 Dependence on renal dialysis: Secondary | ICD-10-CM | POA: Diagnosis not present

## 2019-05-04 DIAGNOSIS — D509 Iron deficiency anemia, unspecified: Secondary | ICD-10-CM | POA: Diagnosis not present

## 2019-05-04 DIAGNOSIS — N186 End stage renal disease: Secondary | ICD-10-CM | POA: Diagnosis not present

## 2019-05-04 DIAGNOSIS — D631 Anemia in chronic kidney disease: Secondary | ICD-10-CM | POA: Diagnosis not present

## 2019-05-04 DIAGNOSIS — N2581 Secondary hyperparathyroidism of renal origin: Secondary | ICD-10-CM | POA: Diagnosis not present

## 2019-05-05 DIAGNOSIS — N2581 Secondary hyperparathyroidism of renal origin: Secondary | ICD-10-CM | POA: Diagnosis not present

## 2019-05-05 DIAGNOSIS — Z992 Dependence on renal dialysis: Secondary | ICD-10-CM | POA: Diagnosis not present

## 2019-05-05 DIAGNOSIS — N186 End stage renal disease: Secondary | ICD-10-CM | POA: Diagnosis not present

## 2019-05-05 DIAGNOSIS — D509 Iron deficiency anemia, unspecified: Secondary | ICD-10-CM | POA: Diagnosis not present

## 2019-05-05 DIAGNOSIS — D631 Anemia in chronic kidney disease: Secondary | ICD-10-CM | POA: Diagnosis not present

## 2019-05-08 DIAGNOSIS — Z992 Dependence on renal dialysis: Secondary | ICD-10-CM | POA: Diagnosis not present

## 2019-05-08 DIAGNOSIS — N186 End stage renal disease: Secondary | ICD-10-CM | POA: Diagnosis not present

## 2019-05-08 DIAGNOSIS — D509 Iron deficiency anemia, unspecified: Secondary | ICD-10-CM | POA: Diagnosis not present

## 2019-05-08 DIAGNOSIS — D631 Anemia in chronic kidney disease: Secondary | ICD-10-CM | POA: Diagnosis not present

## 2019-05-08 DIAGNOSIS — N2581 Secondary hyperparathyroidism of renal origin: Secondary | ICD-10-CM | POA: Diagnosis not present

## 2019-05-09 DIAGNOSIS — D509 Iron deficiency anemia, unspecified: Secondary | ICD-10-CM | POA: Diagnosis not present

## 2019-05-09 DIAGNOSIS — N186 End stage renal disease: Secondary | ICD-10-CM | POA: Diagnosis not present

## 2019-05-09 DIAGNOSIS — D631 Anemia in chronic kidney disease: Secondary | ICD-10-CM | POA: Diagnosis not present

## 2019-05-09 DIAGNOSIS — Z992 Dependence on renal dialysis: Secondary | ICD-10-CM | POA: Diagnosis not present

## 2019-05-09 DIAGNOSIS — N2581 Secondary hyperparathyroidism of renal origin: Secondary | ICD-10-CM | POA: Diagnosis not present

## 2019-05-11 ENCOUNTER — Other Ambulatory Visit: Payer: Self-pay | Admitting: Internal Medicine

## 2019-05-11 DIAGNOSIS — N186 End stage renal disease: Secondary | ICD-10-CM | POA: Diagnosis not present

## 2019-05-11 DIAGNOSIS — D631 Anemia in chronic kidney disease: Secondary | ICD-10-CM | POA: Diagnosis not present

## 2019-05-11 DIAGNOSIS — Z992 Dependence on renal dialysis: Secondary | ICD-10-CM | POA: Diagnosis not present

## 2019-05-11 DIAGNOSIS — N2581 Secondary hyperparathyroidism of renal origin: Secondary | ICD-10-CM | POA: Diagnosis not present

## 2019-05-11 DIAGNOSIS — D509 Iron deficiency anemia, unspecified: Secondary | ICD-10-CM | POA: Diagnosis not present

## 2019-05-11 DIAGNOSIS — I10 Essential (primary) hypertension: Secondary | ICD-10-CM

## 2019-05-12 DIAGNOSIS — Z992 Dependence on renal dialysis: Secondary | ICD-10-CM | POA: Diagnosis not present

## 2019-05-12 DIAGNOSIS — N2581 Secondary hyperparathyroidism of renal origin: Secondary | ICD-10-CM | POA: Diagnosis not present

## 2019-05-12 DIAGNOSIS — N186 End stage renal disease: Secondary | ICD-10-CM | POA: Diagnosis not present

## 2019-05-12 DIAGNOSIS — D509 Iron deficiency anemia, unspecified: Secondary | ICD-10-CM | POA: Diagnosis not present

## 2019-05-12 DIAGNOSIS — D631 Anemia in chronic kidney disease: Secondary | ICD-10-CM | POA: Diagnosis not present

## 2019-05-12 NOTE — Telephone Encounter (Signed)
Next appt scheduled 8/27 with PCP. 

## 2019-05-15 DIAGNOSIS — N2581 Secondary hyperparathyroidism of renal origin: Secondary | ICD-10-CM | POA: Diagnosis not present

## 2019-05-15 DIAGNOSIS — D509 Iron deficiency anemia, unspecified: Secondary | ICD-10-CM | POA: Diagnosis not present

## 2019-05-15 DIAGNOSIS — N186 End stage renal disease: Secondary | ICD-10-CM | POA: Diagnosis not present

## 2019-05-15 DIAGNOSIS — Z992 Dependence on renal dialysis: Secondary | ICD-10-CM | POA: Diagnosis not present

## 2019-05-15 DIAGNOSIS — D631 Anemia in chronic kidney disease: Secondary | ICD-10-CM | POA: Diagnosis not present

## 2019-05-15 MED FILL — ETHYL CHLORIDE SPRAY: 25 days supply | Qty: 104 | Fill #1

## 2019-05-16 DIAGNOSIS — N2581 Secondary hyperparathyroidism of renal origin: Secondary | ICD-10-CM | POA: Diagnosis not present

## 2019-05-16 DIAGNOSIS — D509 Iron deficiency anemia, unspecified: Secondary | ICD-10-CM | POA: Diagnosis not present

## 2019-05-16 DIAGNOSIS — D631 Anemia in chronic kidney disease: Secondary | ICD-10-CM | POA: Diagnosis not present

## 2019-05-16 DIAGNOSIS — N186 End stage renal disease: Secondary | ICD-10-CM | POA: Diagnosis not present

## 2019-05-16 DIAGNOSIS — Z992 Dependence on renal dialysis: Secondary | ICD-10-CM | POA: Diagnosis not present

## 2019-05-17 ENCOUNTER — Telehealth: Payer: Self-pay

## 2019-05-17 NOTE — Telephone Encounter (Signed)
Patient's caregiver, [name] contacted the office stating that his brother was experiencing pain at his left shoulder and he was requesting pain medication that was given in March 2020 for patient's VATS with Dr. Roxan Hockey.  I advised that he would not prescribe pain medication for patient because it was not associated with surgery and had been too long since surgery.  I advised that he could take Ibuprofen and tylenol alternating until he could get into see his PCP which his brother stated was this week.  He acknowledged receipt.

## 2019-05-18 ENCOUNTER — Encounter (HOSPITAL_COMMUNITY): Admission: EM | Disposition: E | Payer: Self-pay | Source: Home / Self Care | Attending: Emergency Medicine

## 2019-05-18 ENCOUNTER — Emergency Department (HOSPITAL_COMMUNITY): Payer: Medicare Other

## 2019-05-18 ENCOUNTER — Ambulatory Visit: Payer: Medicare Other | Admitting: Internal Medicine

## 2019-05-18 ENCOUNTER — Encounter (HOSPITAL_COMMUNITY): Payer: Self-pay | Admitting: Interventional Cardiology

## 2019-05-18 ENCOUNTER — Inpatient Hospital Stay (HOSPITAL_COMMUNITY): Payer: Medicare Other

## 2019-05-18 ENCOUNTER — Other Ambulatory Visit: Payer: Self-pay

## 2019-05-18 ENCOUNTER — Inpatient Hospital Stay (HOSPITAL_COMMUNITY)
Admission: EM | Admit: 2019-05-18 | Discharge: 2019-06-22 | DRG: 246 | Disposition: E | Payer: Medicare Other | Attending: Emergency Medicine | Admitting: Emergency Medicine

## 2019-05-18 DIAGNOSIS — E039 Hypothyroidism, unspecified: Secondary | ICD-10-CM | POA: Diagnosis present

## 2019-05-18 DIAGNOSIS — R579 Shock, unspecified: Secondary | ICD-10-CM | POA: Diagnosis not present

## 2019-05-18 DIAGNOSIS — R609 Edema, unspecified: Secondary | ICD-10-CM | POA: Diagnosis not present

## 2019-05-18 DIAGNOSIS — I493 Ventricular premature depolarization: Secondary | ICD-10-CM | POA: Diagnosis not present

## 2019-05-18 DIAGNOSIS — B965 Pseudomonas (aeruginosa) (mallei) (pseudomallei) as the cause of diseases classified elsewhere: Secondary | ICD-10-CM | POA: Diagnosis present

## 2019-05-18 DIAGNOSIS — Z87891 Personal history of nicotine dependence: Secondary | ICD-10-CM

## 2019-05-18 DIAGNOSIS — I10 Essential (primary) hypertension: Secondary | ICD-10-CM | POA: Diagnosis present

## 2019-05-18 DIAGNOSIS — Z952 Presence of prosthetic heart valve: Secondary | ICD-10-CM | POA: Diagnosis not present

## 2019-05-18 DIAGNOSIS — Z951 Presence of aortocoronary bypass graft: Secondary | ICD-10-CM

## 2019-05-18 DIAGNOSIS — Z953 Presence of xenogenic heart valve: Secondary | ICD-10-CM

## 2019-05-18 DIAGNOSIS — I2102 ST elevation (STEMI) myocardial infarction involving left anterior descending coronary artery: Secondary | ICD-10-CM

## 2019-05-18 DIAGNOSIS — R570 Cardiogenic shock: Secondary | ICD-10-CM | POA: Diagnosis present

## 2019-05-18 DIAGNOSIS — E876 Hypokalemia: Secondary | ICD-10-CM | POA: Diagnosis present

## 2019-05-18 DIAGNOSIS — R000 Tachycardia, unspecified: Secondary | ICD-10-CM | POA: Diagnosis not present

## 2019-05-18 DIAGNOSIS — Z515 Encounter for palliative care: Secondary | ICD-10-CM | POA: Diagnosis not present

## 2019-05-18 DIAGNOSIS — L89816 Pressure-induced deep tissue damage of head: Secondary | ICD-10-CM | POA: Diagnosis not present

## 2019-05-18 DIAGNOSIS — Z992 Dependence on renal dialysis: Secondary | ICD-10-CM

## 2019-05-18 DIAGNOSIS — I2510 Atherosclerotic heart disease of native coronary artery without angina pectoris: Secondary | ICD-10-CM | POA: Diagnosis present

## 2019-05-18 DIAGNOSIS — I6310 Cerebral infarction due to embolism of unspecified precerebral artery: Secondary | ICD-10-CM | POA: Diagnosis not present

## 2019-05-18 DIAGNOSIS — I255 Ischemic cardiomyopathy: Secondary | ICD-10-CM | POA: Diagnosis present

## 2019-05-18 DIAGNOSIS — Z7982 Long term (current) use of aspirin: Secondary | ICD-10-CM

## 2019-05-18 DIAGNOSIS — F419 Anxiety disorder, unspecified: Secondary | ICD-10-CM | POA: Diagnosis present

## 2019-05-18 DIAGNOSIS — I469 Cardiac arrest, cause unspecified: Secondary | ICD-10-CM | POA: Diagnosis not present

## 2019-05-18 DIAGNOSIS — T82898A Other specified complication of vascular prosthetic devices, implants and grafts, initial encounter: Secondary | ICD-10-CM | POA: Diagnosis not present

## 2019-05-18 DIAGNOSIS — M1990 Unspecified osteoarthritis, unspecified site: Secondary | ICD-10-CM | POA: Diagnosis present

## 2019-05-18 DIAGNOSIS — N2581 Secondary hyperparathyroidism of renal origin: Secondary | ICD-10-CM | POA: Diagnosis present

## 2019-05-18 DIAGNOSIS — Y92239 Unspecified place in hospital as the place of occurrence of the external cause: Secondary | ICD-10-CM | POA: Diagnosis not present

## 2019-05-18 DIAGNOSIS — R739 Hyperglycemia, unspecified: Secondary | ICD-10-CM | POA: Diagnosis present

## 2019-05-18 DIAGNOSIS — I252 Old myocardial infarction: Secondary | ICD-10-CM

## 2019-05-18 DIAGNOSIS — D631 Anemia in chronic kidney disease: Secondary | ICD-10-CM | POA: Diagnosis not present

## 2019-05-18 DIAGNOSIS — R402313 Coma scale, best motor response, none, at hospital admission: Secondary | ICD-10-CM | POA: Diagnosis present

## 2019-05-18 DIAGNOSIS — B181 Chronic viral hepatitis B without delta-agent: Secondary | ICD-10-CM | POA: Diagnosis present

## 2019-05-18 DIAGNOSIS — H518 Other specified disorders of binocular movement: Secondary | ICD-10-CM | POA: Diagnosis present

## 2019-05-18 DIAGNOSIS — R0689 Other abnormalities of breathing: Secondary | ICD-10-CM | POA: Diagnosis not present

## 2019-05-18 DIAGNOSIS — E872 Acidosis: Secondary | ICD-10-CM | POA: Diagnosis present

## 2019-05-18 DIAGNOSIS — Z66 Do not resuscitate: Secondary | ICD-10-CM | POA: Diagnosis not present

## 2019-05-18 DIAGNOSIS — Z7189 Other specified counseling: Secondary | ICD-10-CM | POA: Diagnosis not present

## 2019-05-18 DIAGNOSIS — R402113 Coma scale, eyes open, never, at hospital admission: Secondary | ICD-10-CM | POA: Diagnosis present

## 2019-05-18 DIAGNOSIS — Z9861 Coronary angioplasty status: Secondary | ICD-10-CM

## 2019-05-18 DIAGNOSIS — B20 Human immunodeficiency virus [HIV] disease: Secondary | ICD-10-CM | POA: Diagnosis present

## 2019-05-18 DIAGNOSIS — R001 Bradycardia, unspecified: Secondary | ICD-10-CM

## 2019-05-18 DIAGNOSIS — Z79899 Other long term (current) drug therapy: Secondary | ICD-10-CM

## 2019-05-18 DIAGNOSIS — G4733 Obstructive sleep apnea (adult) (pediatric): Secondary | ICD-10-CM | POA: Diagnosis present

## 2019-05-18 DIAGNOSIS — L8990 Pressure ulcer of unspecified site, unspecified stage: Secondary | ICD-10-CM | POA: Diagnosis not present

## 2019-05-18 DIAGNOSIS — R569 Unspecified convulsions: Secondary | ICD-10-CM | POA: Diagnosis not present

## 2019-05-18 DIAGNOSIS — I739 Peripheral vascular disease, unspecified: Secondary | ICD-10-CM | POA: Diagnosis present

## 2019-05-18 DIAGNOSIS — I6340 Cerebral infarction due to embolism of unspecified cerebral artery: Secondary | ICD-10-CM | POA: Diagnosis present

## 2019-05-18 DIAGNOSIS — J9600 Acute respiratory failure, unspecified whether with hypoxia or hypercapnia: Secondary | ICD-10-CM | POA: Diagnosis not present

## 2019-05-18 DIAGNOSIS — Z79891 Long term (current) use of opiate analgesic: Secondary | ICD-10-CM

## 2019-05-18 DIAGNOSIS — R1310 Dysphagia, unspecified: Secondary | ICD-10-CM | POA: Diagnosis present

## 2019-05-18 DIAGNOSIS — I2109 ST elevation (STEMI) myocardial infarction involving other coronary artery of anterior wall: Secondary | ICD-10-CM | POA: Diagnosis not present

## 2019-05-18 DIAGNOSIS — Y849 Medical procedure, unspecified as the cause of abnormal reaction of the patient, or of later complication, without mention of misadventure at the time of the procedure: Secondary | ICD-10-CM | POA: Diagnosis not present

## 2019-05-18 DIAGNOSIS — I120 Hypertensive chronic kidney disease with stage 5 chronic kidney disease or end stage renal disease: Secondary | ICD-10-CM | POA: Diagnosis present

## 2019-05-18 DIAGNOSIS — I4901 Ventricular fibrillation: Secondary | ICD-10-CM | POA: Diagnosis present

## 2019-05-18 DIAGNOSIS — Z8249 Family history of ischemic heart disease and other diseases of the circulatory system: Secondary | ICD-10-CM

## 2019-05-18 DIAGNOSIS — Z452 Encounter for adjustment and management of vascular access device: Secondary | ICD-10-CM

## 2019-05-18 DIAGNOSIS — T462X5A Adverse effect of other antidysrhythmic drugs, initial encounter: Secondary | ICD-10-CM | POA: Diagnosis not present

## 2019-05-18 DIAGNOSIS — Z880 Allergy status to penicillin: Secondary | ICD-10-CM

## 2019-05-18 DIAGNOSIS — G92 Toxic encephalopathy: Secondary | ICD-10-CM | POA: Diagnosis not present

## 2019-05-18 DIAGNOSIS — I499 Cardiac arrhythmia, unspecified: Secondary | ICD-10-CM | POA: Diagnosis not present

## 2019-05-18 DIAGNOSIS — I480 Paroxysmal atrial fibrillation: Secondary | ICD-10-CM | POA: Diagnosis present

## 2019-05-18 DIAGNOSIS — R404 Transient alteration of awareness: Secondary | ICD-10-CM | POA: Diagnosis not present

## 2019-05-18 DIAGNOSIS — Z9842 Cataract extraction status, left eye: Secondary | ICD-10-CM

## 2019-05-18 DIAGNOSIS — G931 Anoxic brain damage, not elsewhere classified: Secondary | ICD-10-CM | POA: Diagnosis present

## 2019-05-18 DIAGNOSIS — D638 Anemia in other chronic diseases classified elsewhere: Secondary | ICD-10-CM | POA: Diagnosis present

## 2019-05-18 DIAGNOSIS — Z833 Family history of diabetes mellitus: Secondary | ICD-10-CM

## 2019-05-18 DIAGNOSIS — R402213 Coma scale, best verbal response, none, at hospital admission: Secondary | ICD-10-CM | POA: Diagnosis present

## 2019-05-18 DIAGNOSIS — G9340 Encephalopathy, unspecified: Secondary | ICD-10-CM | POA: Diagnosis not present

## 2019-05-18 DIAGNOSIS — R0989 Other specified symptoms and signs involving the circulatory and respiratory systems: Secondary | ICD-10-CM | POA: Diagnosis not present

## 2019-05-18 DIAGNOSIS — E162 Hypoglycemia, unspecified: Secondary | ICD-10-CM | POA: Diagnosis not present

## 2019-05-18 DIAGNOSIS — N186 End stage renal disease: Secondary | ICD-10-CM | POA: Diagnosis present

## 2019-05-18 DIAGNOSIS — Z8619 Personal history of other infectious and parasitic diseases: Secondary | ICD-10-CM

## 2019-05-18 DIAGNOSIS — Z888 Allergy status to other drugs, medicaments and biological substances status: Secondary | ICD-10-CM

## 2019-05-18 DIAGNOSIS — Z4682 Encounter for fitting and adjustment of non-vascular catheter: Secondary | ICD-10-CM | POA: Diagnosis not present

## 2019-05-18 DIAGNOSIS — D6489 Other specified anemias: Secondary | ICD-10-CM | POA: Diagnosis present

## 2019-05-18 DIAGNOSIS — R4020 Unspecified coma: Secondary | ICD-10-CM | POA: Diagnosis not present

## 2019-05-18 DIAGNOSIS — I639 Cerebral infarction, unspecified: Secondary | ICD-10-CM | POA: Diagnosis not present

## 2019-05-18 DIAGNOSIS — D696 Thrombocytopenia, unspecified: Secondary | ICD-10-CM | POA: Diagnosis present

## 2019-05-18 DIAGNOSIS — J449 Chronic obstructive pulmonary disease, unspecified: Secondary | ICD-10-CM | POA: Diagnosis present

## 2019-05-18 DIAGNOSIS — Z20828 Contact with and (suspected) exposure to other viral communicable diseases: Secondary | ICD-10-CM | POA: Diagnosis present

## 2019-05-18 DIAGNOSIS — Z9911 Dependence on respirator [ventilator] status: Secondary | ICD-10-CM | POA: Diagnosis not present

## 2019-05-18 DIAGNOSIS — T361X5A Adverse effect of cephalosporins and other beta-lactam antibiotics, initial encounter: Secondary | ICD-10-CM | POA: Diagnosis not present

## 2019-05-18 DIAGNOSIS — J9601 Acute respiratory failure with hypoxia: Secondary | ICD-10-CM

## 2019-05-18 DIAGNOSIS — J156 Pneumonia due to other aerobic Gram-negative bacteria: Secondary | ICD-10-CM | POA: Diagnosis not present

## 2019-05-18 DIAGNOSIS — G40909 Epilepsy, unspecified, not intractable, without status epilepticus: Secondary | ICD-10-CM | POA: Diagnosis present

## 2019-05-18 DIAGNOSIS — Z21 Asymptomatic human immunodeficiency virus [HIV] infection status: Secondary | ICD-10-CM | POA: Diagnosis present

## 2019-05-18 DIAGNOSIS — E8729 Other acidosis: Secondary | ICD-10-CM | POA: Diagnosis present

## 2019-05-18 DIAGNOSIS — Z885 Allergy status to narcotic agent status: Secondary | ICD-10-CM

## 2019-05-18 HISTORY — PX: CORONARY ANGIOGRAPHY: CATH118303

## 2019-05-18 HISTORY — PX: CORONARY/GRAFT ACUTE MI REVASCULARIZATION: CATH118305

## 2019-05-18 LAB — POCT I-STAT, CHEM 8
BUN: 93 mg/dL — ABNORMAL HIGH (ref 8–23)
BUN: 88 mg/dL — ABNORMAL HIGH (ref 8–23)
BUN: 85 mg/dL — ABNORMAL HIGH (ref 8–23)
BUN: 83 mg/dL — ABNORMAL HIGH (ref 8–23)
BUN: 73 mg/dL — ABNORMAL HIGH (ref 8–23)
BUN: 86 mg/dL — ABNORMAL HIGH (ref 8–23)
BUN: 88 mg/dL — ABNORMAL HIGH (ref 8–23)
Calcium, Ion: 1.19 mmol/L (ref 1.15–1.40)
Calcium, Ion: 1.08 mmol/L — ABNORMAL LOW (ref 1.15–1.40)
Calcium, Ion: 1.08 mmol/L — ABNORMAL LOW (ref 1.15–1.40)
Calcium, Ion: 1.07 mmol/L — ABNORMAL LOW (ref 1.15–1.40)
Calcium, Ion: 1.03 mmol/L — ABNORMAL LOW (ref 1.15–1.40)
Calcium, Ion: 1.06 mmol/L — ABNORMAL LOW (ref 1.15–1.40)
Calcium, Ion: 1.05 mmol/L — ABNORMAL LOW (ref 1.15–1.40)
Chloride: 102 mmol/L (ref 98–111)
Chloride: 100 mmol/L (ref 98–111)
Chloride: 102 mmol/L (ref 98–111)
Chloride: 102 mmol/L (ref 98–111)
Chloride: 106 mmol/L (ref 98–111)
Chloride: 102 mmol/L (ref 98–111)
Chloride: 101 mmol/L (ref 98–111)
Creatinine, Ser: 13.7 mg/dL — ABNORMAL HIGH (ref 0.61–1.24)
Creatinine, Ser: 12.9 mg/dL — ABNORMAL HIGH (ref 0.61–1.24)
Creatinine, Ser: 12.7 mg/dL — ABNORMAL HIGH (ref 0.61–1.24)
Creatinine, Ser: 13.4 mg/dL — ABNORMAL HIGH (ref 0.61–1.24)
Creatinine, Ser: 12.2 mg/dL — ABNORMAL HIGH (ref 0.61–1.24)
Creatinine, Ser: 13.6 mg/dL — ABNORMAL HIGH (ref 0.61–1.24)
Creatinine, Ser: 13.2 mg/dL — ABNORMAL HIGH (ref 0.61–1.24)
Glucose, Bld: 269 mg/dL — ABNORMAL HIGH (ref 70–99)
Glucose, Bld: 249 mg/dL — ABNORMAL HIGH (ref 70–99)
Glucose, Bld: 227 mg/dL — ABNORMAL HIGH (ref 70–99)
Glucose, Bld: 151 mg/dL — ABNORMAL HIGH (ref 70–99)
Glucose, Bld: 113 mg/dL — ABNORMAL HIGH (ref 70–99)
Glucose, Bld: 119 mg/dL — ABNORMAL HIGH (ref 70–99)
Glucose, Bld: 134 mg/dL — ABNORMAL HIGH (ref 70–99)
HCT: 29 % — ABNORMAL LOW (ref 39.0–52.0)
HCT: 29 % — ABNORMAL LOW (ref 39.0–52.0)
HCT: 28 % — ABNORMAL LOW (ref 39.0–52.0)
HCT: 27 % — ABNORMAL LOW (ref 39.0–52.0)
HCT: 38 % — ABNORMAL LOW (ref 39.0–52.0)
HCT: 27 % — ABNORMAL LOW (ref 39.0–52.0)
HCT: 27 % — ABNORMAL LOW (ref 39.0–52.0)
Hemoglobin: 9.9 g/dL — ABNORMAL LOW (ref 13.0–17.0)
Hemoglobin: 9.9 g/dL — ABNORMAL LOW (ref 13.0–17.0)
Hemoglobin: 9.5 g/dL — ABNORMAL LOW (ref 13.0–17.0)
Hemoglobin: 9.2 g/dL — ABNORMAL LOW (ref 13.0–17.0)
Hemoglobin: 12.9 g/dL — ABNORMAL LOW (ref 13.0–17.0)
Hemoglobin: 9.2 g/dL — ABNORMAL LOW (ref 13.0–17.0)
Hemoglobin: 9.2 g/dL — ABNORMAL LOW (ref 13.0–17.0)
Potassium: 3.4 mmol/L — ABNORMAL LOW (ref 3.5–5.1)
Potassium: 3.2 mmol/L — ABNORMAL LOW (ref 3.5–5.1)
Potassium: 3.2 mmol/L — ABNORMAL LOW (ref 3.5–5.1)
Potassium: 3.7 mmol/L (ref 3.5–5.1)
Potassium: 3.8 mmol/L (ref 3.5–5.1)
Potassium: 4.5 mmol/L (ref 3.5–5.1)
Potassium: 4.4 mmol/L (ref 3.5–5.1)
Sodium: 138 mmol/L (ref 135–145)
Sodium: 138 mmol/L (ref 135–145)
Sodium: 138 mmol/L (ref 135–145)
Sodium: 138 mmol/L (ref 135–145)
Sodium: 138 mmol/L (ref 135–145)
Sodium: 137 mmol/L (ref 135–145)
Sodium: 137 mmol/L (ref 135–145)
TCO2: 21 mmol/L — ABNORMAL LOW (ref 22–32)
TCO2: 23 mmol/L (ref 22–32)
TCO2: 21 mmol/L — ABNORMAL LOW (ref 22–32)
TCO2: 22 mmol/L (ref 22–32)
TCO2: 20 mmol/L — ABNORMAL LOW (ref 22–32)
TCO2: 20 mmol/L — ABNORMAL LOW (ref 22–32)
TCO2: 20 mmol/L — ABNORMAL LOW (ref 22–32)

## 2019-05-18 LAB — POCT I-STAT 7, (LYTES, BLD GAS, ICA,H+H)
Acid-base deficit: 12 mmol/L — ABNORMAL HIGH (ref 0.0–2.0)
Acid-base deficit: 7 mmol/L — ABNORMAL HIGH (ref 0.0–2.0)
Acid-base deficit: 4 mmol/L — ABNORMAL HIGH (ref 0.0–2.0)
Bicarbonate: 15.5 mmol/L — ABNORMAL LOW (ref 20.0–28.0)
Bicarbonate: 19.6 mmol/L — ABNORMAL LOW (ref 20.0–28.0)
Bicarbonate: 20 mmol/L (ref 20.0–28.0)
Calcium, Ion: 1.2 mmol/L (ref 1.15–1.40)
Calcium, Ion: 1.17 mmol/L (ref 1.15–1.40)
Calcium, Ion: 1.05 mmol/L — ABNORMAL LOW (ref 1.15–1.40)
HCT: 26 % — ABNORMAL LOW (ref 39.0–52.0)
HCT: 28 % — ABNORMAL LOW (ref 39.0–52.0)
HCT: 26 % — ABNORMAL LOW (ref 39.0–52.0)
Hemoglobin: 8.8 g/dL — ABNORMAL LOW (ref 13.0–17.0)
Hemoglobin: 9.5 g/dL — ABNORMAL LOW (ref 13.0–17.0)
Hemoglobin: 8.8 g/dL — ABNORMAL LOW (ref 13.0–17.0)
O2 Saturation: 80 %
O2 Saturation: 100 %
O2 Saturation: 100 %
Patient temperature: 98.7
Patient temperature: 33.1
Potassium: 3.2 mmol/L — ABNORMAL LOW (ref 3.5–5.1)
Potassium: 3.4 mmol/L — ABNORMAL LOW (ref 3.5–5.1)
Potassium: 3.9 mmol/L (ref 3.5–5.1)
Sodium: 138 mmol/L (ref 135–145)
Sodium: 138 mmol/L (ref 135–145)
Sodium: 138 mmol/L (ref 135–145)
TCO2: 17 mmol/L — ABNORMAL LOW (ref 22–32)
TCO2: 21 mmol/L — ABNORMAL LOW (ref 22–32)
TCO2: 21 mmol/L — ABNORMAL LOW (ref 22–32)
pCO2 arterial: 41.1 mmHg (ref 32.0–48.0)
pCO2 arterial: 42.1 mmHg (ref 32.0–48.0)
pCO2 arterial: 26 mmHg — ABNORMAL LOW (ref 32.0–48.0)
pH, Arterial: 7.185 — CL (ref 7.350–7.450)
pH, Arterial: 7.275 — ABNORMAL LOW (ref 7.350–7.450)
pH, Arterial: 7.477 — ABNORMAL HIGH (ref 7.350–7.450)
pO2, Arterial: 55 mmHg — ABNORMAL LOW (ref 83.0–108.0)
pO2, Arterial: 485 mmHg — ABNORMAL HIGH (ref 83.0–108.0)
pO2, Arterial: 199 mmHg — ABNORMAL HIGH (ref 83.0–108.0)

## 2019-05-18 LAB — CBC
HCT: 28.4 % — ABNORMAL LOW (ref 39.0–52.0)
HCT: 27.5 % — ABNORMAL LOW (ref 39.0–52.0)
Hemoglobin: 8.7 g/dL — ABNORMAL LOW (ref 13.0–17.0)
Hemoglobin: 8.8 g/dL — ABNORMAL LOW (ref 13.0–17.0)
MCH: 30.1 pg (ref 26.0–34.0)
MCH: 30.1 pg (ref 26.0–34.0)
MCHC: 30.6 g/dL (ref 30.0–36.0)
MCHC: 32 g/dL (ref 30.0–36.0)
MCV: 98.3 fL (ref 80.0–100.0)
MCV: 94.2 fL (ref 80.0–100.0)
Platelets: 265 10*3/uL (ref 150–400)
Platelets: 200 10*3/uL (ref 150–400)
RBC: 2.89 MIL/uL — ABNORMAL LOW (ref 4.22–5.81)
RBC: 2.92 MIL/uL — ABNORMAL LOW (ref 4.22–5.81)
RDW: 15.9 % — ABNORMAL HIGH (ref 11.5–15.5)
RDW: 15.8 % — ABNORMAL HIGH (ref 11.5–15.5)
WBC: 11.5 10*3/uL — ABNORMAL HIGH (ref 4.0–10.5)
WBC: 13.3 10*3/uL — ABNORMAL HIGH (ref 4.0–10.5)
nRBC: 0.4 % — ABNORMAL HIGH (ref 0.0–0.2)
nRBC: 0 % (ref 0.0–0.2)

## 2019-05-18 LAB — BASIC METABOLIC PANEL
Anion gap: 26 — ABNORMAL HIGH (ref 5–15)
Anion gap: 23 — ABNORMAL HIGH (ref 5–15)
BUN: 83 mg/dL — ABNORMAL HIGH (ref 8–23)
BUN: 83 mg/dL — ABNORMAL HIGH (ref 8–23)
CO2: 14 mmol/L — ABNORMAL LOW (ref 22–32)
CO2: 17 mmol/L — ABNORMAL LOW (ref 22–32)
Calcium: 9.3 mg/dL (ref 8.9–10.3)
Calcium: 8.3 mg/dL — ABNORMAL LOW (ref 8.9–10.3)
Chloride: 96 mmol/L — ABNORMAL LOW (ref 98–111)
Chloride: 98 mmol/L (ref 98–111)
Creatinine, Ser: 14.39 mg/dL — ABNORMAL HIGH (ref 0.61–1.24)
Creatinine, Ser: 13.12 mg/dL — ABNORMAL HIGH (ref 0.61–1.24)
GFR calc Af Amer: 4 mL/min — ABNORMAL LOW (ref >60)
GFR calc Af Amer: 4 mL/min — ABNORMAL LOW (ref >60)
GFR calc non Af Amer: 3 mL/min — ABNORMAL LOW (ref >60)
GFR calc non Af Amer: 3 mL/min — ABNORMAL LOW (ref >60)
Glucose, Bld: 283 mg/dL — ABNORMAL HIGH (ref 70–99)
Glucose, Bld: 259 mg/dL — ABNORMAL HIGH (ref 70–99)
Potassium: 3.7 mmol/L (ref 3.5–5.1)
Potassium: 3.2 mmol/L — ABNORMAL LOW (ref 3.5–5.1)
Sodium: 136 mmol/L (ref 135–145)
Sodium: 138 mmol/L (ref 135–145)

## 2019-05-18 LAB — TROPONIN I (HIGH SENSITIVITY)
Troponin I (High Sensitivity): 840 ng/L (ref <18)
Troponin I (High Sensitivity): >27000 ng/L (ref <18)

## 2019-05-18 LAB — PROTIME-INR
INR: 1.3 — ABNORMAL HIGH (ref 0.8–1.2)
INR: 1.6 — ABNORMAL HIGH (ref 0.8–1.2)
INR: 1.5 — ABNORMAL HIGH (ref 0.8–1.2)
Prothrombin Time: 16.4 seconds — ABNORMAL HIGH (ref 11.4–15.2)
Prothrombin Time: 18.7 seconds — ABNORMAL HIGH (ref 11.4–15.2)
Prothrombin Time: 17.7 seconds — ABNORMAL HIGH (ref 11.4–15.2)

## 2019-05-18 LAB — POCT I-STAT EG7
Acid-base deficit: 11 mmol/L — ABNORMAL HIGH (ref 0.0–2.0)
Bicarbonate: 15 mmol/L — ABNORMAL LOW (ref 20.0–28.0)
Calcium, Ion: 1.13 mmol/L — ABNORMAL LOW (ref 1.15–1.40)
HCT: 28 % — ABNORMAL LOW (ref 39.0–52.0)
Hemoglobin: 9.5 g/dL — ABNORMAL LOW (ref 13.0–17.0)
O2 Saturation: 87 %
Patient temperature: 37
Potassium: 3.6 mmol/L (ref 3.5–5.1)
Sodium: 136 mmol/L (ref 135–145)
TCO2: 16 mmol/L — ABNORMAL LOW (ref 22–32)
pCO2, Ven: 33.3 mmHg — ABNORMAL LOW (ref 44.0–60.0)
pH, Ven: 7.261 (ref 7.250–7.430)
pO2, Ven: 59 mmHg — ABNORMAL HIGH (ref 32.0–45.0)

## 2019-05-18 LAB — CBG MONITORING, ED: Glucose-Capillary: 288 mg/dL — ABNORMAL HIGH (ref 70–99)

## 2019-05-18 LAB — PHOSPHORUS: Phosphorus: 4.5 mg/dL (ref 2.5–4.6)

## 2019-05-18 LAB — LACTIC ACID, PLASMA: Lactic Acid, Venous: 6.6 mmol/L (ref 0.5–1.9)

## 2019-05-18 LAB — GLUCOSE, CAPILLARY
Glucose-Capillary: 225 mg/dL — ABNORMAL HIGH (ref 70–99)
Glucose-Capillary: 123 mg/dL — ABNORMAL HIGH (ref 70–99)
Glucose-Capillary: 112 mg/dL — ABNORMAL HIGH (ref 70–99)
Glucose-Capillary: 124 mg/dL — ABNORMAL HIGH (ref 70–99)

## 2019-05-18 LAB — POCT ACTIVATED CLOTTING TIME
Activated Clotting Time: 246 seconds
Activated Clotting Time: 621 seconds
Activated Clotting Time: 202 seconds
Activated Clotting Time: 147 seconds

## 2019-05-18 LAB — MRSA PCR SCREENING: MRSA by PCR: NEGATIVE

## 2019-05-18 LAB — ECHOCARDIOGRAM COMPLETE
Height: 72 in
Weight: 3068.8 oz

## 2019-05-18 LAB — APTT
aPTT: 39 seconds — ABNORMAL HIGH (ref 24–36)
aPTT: 97 seconds — ABNORMAL HIGH (ref 24–36)

## 2019-05-18 LAB — SARS CORONAVIRUS 2 BY RT PCR (HOSPITAL ORDER, PERFORMED IN ~~LOC~~ HOSPITAL LAB): SARS Coronavirus 2: NEGATIVE

## 2019-05-18 LAB — I-STAT CREATININE, ED: Creatinine, Ser: 14.2 mg/dL — ABNORMAL HIGH (ref 0.61–1.24)

## 2019-05-18 LAB — HEPARIN LEVEL (UNFRACTIONATED): Heparin Unfractionated: 0.19 IU/mL — ABNORMAL LOW (ref 0.30–0.70)

## 2019-05-18 SURGERY — CORONARY/GRAFT ACUTE MI REVASCULARIZATION
Anesthesia: LOCAL

## 2019-05-18 MED ORDER — ACETAMINOPHEN 325 MG PO TABS: 650.0000 mg | ORAL_TABLET | ORAL | Status: DC | PRN

## 2019-05-18 MED ORDER — CISATRACURIUM BOLUS VIA INFUSION
0.1000 mg/kg | Freq: Once | INTRAVENOUS | Status: AC
  Administered 2019-05-18: 11:00:00 8.7 mg via INTRAVENOUS
  Filled 2019-05-18: qty 9

## 2019-05-18 MED ORDER — SODIUM CHLORIDE 0.9 % IV SOLN
250.0000 mL | INTRAVENOUS | Status: DC | PRN
  Administered 2019-05-26: 250 mL via INTRAVENOUS

## 2019-05-18 MED ORDER — HEPARIN (PORCINE) IN NACL 1000-0.9 UT/500ML-% IV SOLN
INTRAVENOUS | Status: AC
  Filled 2019-05-18: qty 1000

## 2019-05-18 MED ORDER — POTASSIUM CHLORIDE 10 MEQ/50ML IV SOLN
10.0000 meq | INTRAVENOUS | Status: AC
  Administered 2019-05-18 (×2): 10 meq via INTRAVENOUS
  Filled 2019-05-18: qty 50

## 2019-05-18 MED ORDER — TICAGRELOR 90 MG PO TABS: 90.0000 mg | ORAL_TABLET | Freq: Two times a day (BID) | ORAL | Status: DC

## 2019-05-18 MED ORDER — ASPIRIN 300 MG RE SUPP
300.0000 mg | RECTAL | Status: AC
  Administered 2019-05-18: 08:00:00 300 mg via RECTAL
  Filled 2019-05-18: qty 1

## 2019-05-18 MED ORDER — SODIUM CHLORIDE 0.9 % IV SOLN
1.0000 ug/kg/min | INTRAVENOUS | Status: DC
  Administered 2019-05-18: 1 ug/kg/min via INTRAVENOUS
  Administered 2019-05-19: 09:00:00 1.5 ug/kg/min via INTRAVENOUS
  Filled 2019-05-18 (×3): qty 20

## 2019-05-18 MED ORDER — SODIUM BICARBONATE 8.4 % IV SOLN
INTRAVENOUS | Status: AC
  Filled 2019-05-18: qty 50

## 2019-05-18 MED ORDER — "THROMBI-PAD 3""X3"" EX PADS"
1.0000 | MEDICATED_PAD | Freq: Once | CUTANEOUS | Status: AC
  Administered 2019-05-19: 1 via TOPICAL
  Filled 2019-05-18: qty 1

## 2019-05-18 MED ORDER — SODIUM CHLORIDE 0.9% FLUSH
3.0000 mL | Freq: Two times a day (BID) | INTRAVENOUS | Status: DC
  Administered 2019-05-18 – 2019-05-27 (×17): 3 mL via INTRAVENOUS

## 2019-05-18 MED ORDER — MIDAZOLAM HCL 2 MG/2ML IJ SOLN
INTRAMUSCULAR | Status: AC
  Filled 2019-05-18: qty 2

## 2019-05-18 MED ORDER — SODIUM CHLORIDE 0.9 % IV SOLN
INTRAVENOUS | Status: DC
  Administered 2019-05-19: 05:00:00 50 mL/h via INTRAVENOUS

## 2019-05-18 MED ORDER — PANTOPRAZOLE SODIUM 40 MG IV SOLR
40.0000 mg | Freq: Every day | INTRAVENOUS | Status: DC
  Administered 2019-05-18 – 2019-05-19 (×2): 40 mg via INTRAVENOUS
  Filled 2019-05-18 (×2): qty 40

## 2019-05-18 MED ORDER — HEPARIN SODIUM (PORCINE) 1000 UNIT/ML IJ SOLN
INTRAMUSCULAR | Status: AC
  Filled 2019-05-18: qty 1

## 2019-05-18 MED ORDER — CHLORHEXIDINE GLUCONATE 0.12% ORAL RINSE (MEDLINE KIT)
15.0000 mL | Freq: Two times a day (BID) | OROMUCOSAL | Status: DC
  Administered 2019-05-18 – 2019-06-02 (×30): 15 mL via OROMUCOSAL

## 2019-05-18 MED ORDER — ROCURONIUM BROMIDE 50 MG/5ML IV SOLN
INTRAVENOUS | Status: AC | PRN
  Administered 2019-05-18: 100 mg via INTRAVENOUS

## 2019-05-18 MED ORDER — MIDAZOLAM HCL 2 MG/2ML IJ SOLN
2.0000 mg | INTRAMUSCULAR | Status: DC | PRN
  Administered 2019-05-19: 05:00:00 2 mg via INTRAVENOUS
  Filled 2019-05-18: qty 2

## 2019-05-18 MED ORDER — NOREPINEPHRINE 4 MG/250ML-% IV SOLN
INTRAVENOUS | Status: AC
  Filled 2019-05-18: qty 250

## 2019-05-18 MED ORDER — ORAL CARE MOUTH RINSE
15.0000 mL | OROMUCOSAL | Status: DC
  Administered 2019-05-18 – 2019-06-02 (×145): 15 mL via OROMUCOSAL

## 2019-05-18 MED ORDER — ALBUTEROL SULFATE (2.5 MG/3ML) 0.083% IN NEBU: 2.5000 mg | INHALATION_SOLUTION | Freq: Four times a day (QID) | RESPIRATORY_TRACT | Status: DC | PRN

## 2019-05-18 MED ORDER — SODIUM CHLORIDE 0.9 % IV SOLN
INTRAVENOUS | Status: AC | PRN
  Administered 2019-05-18: 08:00:00 4 ug/kg/min via INTRAVENOUS

## 2019-05-18 MED ORDER — AMIODARONE HCL IN DEXTROSE 360-4.14 MG/200ML-% IV SOLN
30.0000 mg/h | INTRAVENOUS | Status: DC
  Filled 2019-05-18 (×2): qty 200

## 2019-05-18 MED ORDER — SODIUM CHLORIDE 0.9% FLUSH: 3.0000 mL | INTRAVENOUS | Status: DC | PRN

## 2019-05-18 MED ORDER — LIDOCAINE HCL (PF) 1 % IJ SOLN
INTRAMUSCULAR | Status: AC
  Filled 2019-05-18: qty 30

## 2019-05-18 MED ORDER — ACETAMINOPHEN 325 MG PO TABS: 650.0000 mg | ORAL_TABLET | Freq: Four times a day (QID) | ORAL | Status: DC | PRN

## 2019-05-18 MED ORDER — ONDANSETRON HCL 4 MG/2ML IJ SOLN: 4.0000 mg | Freq: Four times a day (QID) | INTRAMUSCULAR | Status: DC | PRN

## 2019-05-18 MED ORDER — ASPIRIN 300 MG RE SUPP
300.0000 mg | RECTAL | Status: AC
  Administered 2019-05-18: 07:00:00 300 mg via RECTAL
  Filled 2019-05-18: qty 1

## 2019-05-18 MED ORDER — INSULIN REGULAR(HUMAN) IN NACL 100-0.9 UT/100ML-% IV SOLN: INTRAVENOUS | Status: DC

## 2019-05-18 MED ORDER — ACETAMINOPHEN 325 MG PO TABS
650.0000 mg | ORAL_TABLET | ORAL | Status: DC | PRN
  Administered 2019-05-25 (×2): 650 mg
  Filled 2019-05-18 (×3): qty 2

## 2019-05-18 MED ORDER — CISATRACURIUM BOLUS VIA INFUSION
0.0500 mg/kg | INTRAVENOUS | Status: DC | PRN
  Filled 2019-05-18: qty 5

## 2019-05-18 MED ORDER — ACETAMINOPHEN 160 MG/5ML PO SOLN: 650.0000 mg | Freq: Four times a day (QID) | ORAL | Status: DC | PRN

## 2019-05-18 MED ORDER — ASPIRIN 81 MG PO CHEW
81.0000 mg | CHEWABLE_TABLET | Freq: Every day | ORAL | Status: DC
  Administered 2019-05-19 – 2019-06-02 (×15): 81 mg
  Filled 2019-05-18 (×15): qty 1

## 2019-05-18 MED ORDER — NOREPINEPHRINE 4 MG/250ML-% IV SOLN
0.0000 ug/min | INTRAVENOUS | Status: DC
  Administered 2019-05-18: 18:00:00 2 ug/min via INTRAVENOUS
  Administered 2019-05-19: 11:00:00 17 ug/min via INTRAVENOUS
  Filled 2019-05-18 (×2): qty 250

## 2019-05-18 MED ORDER — SODIUM CHLORIDE 0.9 % IV SOLN
4.0000 ug/kg/min | INTRAVENOUS | Status: AC
  Administered 2019-05-18: 11:00:00 4 ug/kg/min via INTRAVENOUS
  Filled 2019-05-18 (×2): qty 50

## 2019-05-18 MED ORDER — AMIODARONE HCL IN DEXTROSE 360-4.14 MG/200ML-% IV SOLN
60.0000 mg/h | INTRAVENOUS | Status: AC
  Administered 2019-05-18 (×2): 60 mg/h via INTRAVENOUS

## 2019-05-18 MED ORDER — LABETALOL HCL 5 MG/ML IV SOLN
10.0000 mg | INTRAVENOUS | Status: AC | PRN
  Filled 2019-05-18: qty 4

## 2019-05-18 MED ORDER — CHLORHEXIDINE GLUCONATE CLOTH 2 % EX PADS
6.0000 | MEDICATED_PAD | Freq: Every day | CUTANEOUS | Status: DC
  Administered 2019-05-18 – 2019-05-27 (×11): 6 via TOPICAL

## 2019-05-18 MED ORDER — THROMBIN 20000 UNITS EX SOLR: 20000.0000 [IU] | Freq: Once | CUTANEOUS | Status: DC

## 2019-05-18 MED ORDER — CANGRELOR BOLUS VIA INFUSION
INTRAVENOUS | Status: DC | PRN
  Administered 2019-05-18: 2610 ug via INTRAVENOUS

## 2019-05-18 MED ORDER — CLONIDINE HCL 0.3 MG/24HR TD PTWK: 0.3000 mg | MEDICATED_PATCH | TRANSDERMAL | Status: DC

## 2019-05-18 MED ORDER — LIDOCAINE HCL (PF) 1 % IJ SOLN
INTRAMUSCULAR | Status: DC | PRN
  Administered 2019-05-18: 15 mL

## 2019-05-18 MED ORDER — CANGRELOR TETRASODIUM 50 MG IV SOLR
INTRAVENOUS | Status: AC
  Filled 2019-05-18: qty 50

## 2019-05-18 MED ORDER — FENTANYL BOLUS VIA INFUSION
25.0000 ug | INTRAVENOUS | Status: DC | PRN
  Filled 2019-05-18: qty 25

## 2019-05-18 MED ORDER — AMIODARONE HCL IN DEXTROSE 360-4.14 MG/200ML-% IV SOLN
INTRAVENOUS | Status: AC
  Filled 2019-05-18: qty 200

## 2019-05-18 MED ORDER — HYDRALAZINE HCL 20 MG/ML IJ SOLN
10.0000 mg | INTRAMUSCULAR | Status: AC | PRN
  Administered 2019-05-18: 10:00:00 10 mg via INTRAVENOUS
  Filled 2019-05-18: qty 1

## 2019-05-18 MED ORDER — ETOMIDATE 2 MG/ML IV SOLN
INTRAVENOUS | Status: AC | PRN
  Administered 2019-05-18: 20 mg via INTRAVENOUS

## 2019-05-18 MED ORDER — HEPARIN (PORCINE) IN NACL 1000-0.9 UT/500ML-% IV SOLN
INTRAVENOUS | Status: DC | PRN
  Administered 2019-05-18 (×2): 500 mL

## 2019-05-18 MED ORDER — TICAGRELOR 90 MG PO TABS
ORAL_TABLET | ORAL | Status: AC
  Filled 2019-05-18: qty 1

## 2019-05-18 MED ORDER — HEPARIN BOLUS VIA INFUSION
3000.0000 [IU] | Freq: Once | INTRAVENOUS | Status: DC
  Filled 2019-05-18: qty 3000

## 2019-05-18 MED ORDER — MIDAZOLAM HCL 2 MG/2ML IJ SOLN
INTRAMUSCULAR | Status: DC | PRN
  Administered 2019-05-18: 2 mg via INTRAVENOUS

## 2019-05-18 MED ORDER — TICAGRELOR 90 MG PO TABS
ORAL_TABLET | ORAL | Status: AC
  Filled 2019-05-18: qty 2

## 2019-05-18 MED ORDER — FENTANYL 2500MCG IN NS 250ML (10MCG/ML) PREMIX INFUSION
100.0000 ug/h | INTRAVENOUS | Status: DC
  Administered 2019-05-19: 22:00:00 300 ug/h via INTRAVENOUS
  Administered 2019-05-19: 03:00:00 150 ug/h via INTRAVENOUS
  Administered 2019-05-19: 225 ug/h via INTRAVENOUS
  Administered 2019-05-20: 50 ug/h via INTRAVENOUS
  Filled 2019-05-18 (×5): qty 250

## 2019-05-18 MED ORDER — HEPARIN SODIUM (PORCINE) 1000 UNIT/ML IJ SOLN
INTRAMUSCULAR | Status: DC | PRN
  Administered 2019-05-18: 9000 [IU] via INTRAVENOUS
  Administered 2019-05-18: 4000 [IU] via INTRAVENOUS

## 2019-05-18 MED ORDER — IOHEXOL 350 MG/ML SOLN
INTRAVENOUS | Status: DC | PRN
  Administered 2019-05-18: 09:00:00 165 mL via INTRA_ARTERIAL

## 2019-05-18 MED ORDER — SODIUM CHLORIDE 0.9 % IV BOLUS
1000.0000 mL | Freq: Once | INTRAVENOUS | Status: AC
  Administered 2019-05-18: 07:00:00 1000 mL via INTRAVENOUS

## 2019-05-18 MED ORDER — FENTANYL CITRATE (PF) 100 MCG/2ML IJ SOLN: 50.0000 ug | Freq: Once | INTRAMUSCULAR | Status: DC

## 2019-05-18 MED ORDER — ASPIRIN 81 MG PO CHEW: 81.0000 mg | CHEWABLE_TABLET | Freq: Every day | ORAL | Status: DC

## 2019-05-18 MED ORDER — TICAGRELOR 90 MG PO TABS
90.0000 mg | ORAL_TABLET | Freq: Two times a day (BID) | ORAL | Status: DC
  Administered 2019-05-18 – 2019-06-02 (×30): 90 mg
  Filled 2019-05-18 (×30): qty 1

## 2019-05-18 MED ORDER — VITAL HIGH PROTEIN PO LIQD
1000.0000 mL | ORAL | Status: DC
  Administered 2019-05-18 – 2019-05-21 (×5): 1000 mL

## 2019-05-18 MED ORDER — SODIUM BICARBONATE 8.4 % IV SOLN
INTRAVENOUS | Status: DC | PRN
  Administered 2019-05-18: 50 meq via INTRAVENOUS

## 2019-05-18 MED ORDER — BICTEGRAVIR-EMTRICITAB-TENOFOV 50-200-25 MG PO TABS
1.0000 | ORAL_TABLET | Freq: Every day | ORAL | Status: DC
  Administered 2019-05-18 – 2019-05-24 (×7): 1 via ORAL
  Filled 2019-05-18 (×7): qty 1

## 2019-05-18 MED ORDER — ASPIRIN EC 81 MG PO TBEC: 81.0000 mg | DELAYED_RELEASE_TABLET | Freq: Every day | ORAL | Status: DC

## 2019-05-18 MED ORDER — TICAGRELOR 90 MG PO TABS
ORAL_TABLET | ORAL | Status: DC | PRN
  Administered 2019-05-18: 180 mg via NASOGASTRIC

## 2019-05-18 MED ORDER — MIDAZOLAM HCL 2 MG/2ML IJ SOLN
2.0000 mg | INTRAMUSCULAR | Status: AC | PRN
  Administered 2019-05-18 – 2019-05-19 (×3): 2 mg via INTRAVENOUS
  Filled 2019-05-18 (×3): qty 2

## 2019-05-18 MED ORDER — NOREPINEPHRINE 4 MG/250ML-% IV SOLN
5.0000 ug/min | INTRAVENOUS | Status: DC
  Administered 2019-05-18: 07:00:00 5 ug/min via INTRAVENOUS

## 2019-05-18 MED ORDER — VERAPAMIL HCL 2.5 MG/ML IV SOLN
INTRAVENOUS | Status: AC
  Filled 2019-05-18: qty 2

## 2019-05-18 MED ORDER — HEPARIN SODIUM (PORCINE) 5000 UNIT/ML IJ SOLN
5000.0000 [IU] | Freq: Three times a day (TID) | INTRAMUSCULAR | Status: DC
  Administered 2019-05-19 – 2019-05-20 (×5): 5000 [IU] via SUBCUTANEOUS
  Filled 2019-05-18 (×6): qty 1

## 2019-05-18 MED ORDER — PROPOFOL 1000 MG/100ML IV EMUL
25.0000 ug/kg/min | INTRAVENOUS | Status: DC
  Administered 2019-05-18 (×2): 25 ug/kg/min via INTRAVENOUS
  Filled 2019-05-18 (×3): qty 100

## 2019-05-18 MED ORDER — POTASSIUM CHLORIDE 10 MEQ/50ML IV SOLN
10.0000 meq | INTRAVENOUS | Status: DC
  Administered 2019-05-18 (×2): 10 meq via INTRAVENOUS
  Filled 2019-05-18 (×3): qty 50

## 2019-05-18 MED ORDER — HEPARIN (PORCINE) 25000 UT/250ML-% IV SOLN: 700.0000 [IU]/h | INTRAVENOUS | Status: DC

## 2019-05-18 MED ORDER — "THROMBI-PAD 3""X3"" EX PADS"
1.0000 | MEDICATED_PAD | Freq: Once | CUTANEOUS | Status: AC
  Administered 2019-05-18: 17:00:00 1 via TOPICAL
  Filled 2019-05-18: qty 1

## 2019-05-18 MED ORDER — ARTIFICIAL TEARS OPHTHALMIC OINT
1.0000 "application " | TOPICAL_OINTMENT | Freq: Three times a day (TID) | OPHTHALMIC | Status: DC
  Administered 2019-05-18 – 2019-05-27 (×26): 1 via OPHTHALMIC
  Filled 2019-05-18 (×4): qty 3.5

## 2019-05-18 MED ORDER — VERAPAMIL HCL 2.5 MG/ML IV SOLN
INTRAVENOUS | Status: DC | PRN
  Administered 2019-05-18 (×4): 200 ug via INTRACORONARY

## 2019-05-18 SURGICAL SUPPLY — 18 items
BALLN SAPPHIRE 2.0X15 (BALLOONS) ×2
BALLN SAPPHIRE ~~LOC~~ 3.0X15 (BALLOONS) ×1 IMPLANT
BALLOON SAPPHIRE 2.0X15 (BALLOONS) IMPLANT
CATH EXTRAC PRONTO 5.5F 138CM (CATHETERS) ×1 IMPLANT
CATH INFINITI 5FR MULTPACK ANG (CATHETERS) ×1 IMPLANT
CATH LAUNCHER 6FR EBU 3.75 (CATHETERS) ×1 IMPLANT
HOVERMATT SINGLE USE (MISCELLANEOUS) ×1 IMPLANT
KIT ENCORE 26 ADVANTAGE (KITS) ×1 IMPLANT
KIT HEART LEFT (KITS) ×2 IMPLANT
KIT HEMO VALVE WATCHDOG (MISCELLANEOUS) ×1 IMPLANT
PACK CARDIAC CATHETERIZATION (CUSTOM PROCEDURE TRAY) ×2 IMPLANT
SHEATH PINNACLE 6F 10CM (SHEATH) ×1 IMPLANT
SHEATH PROBE COVER 6X72 (BAG) ×1 IMPLANT
STENT SYNERGY DES 2.5X20 (Permanent Stent) ×1 IMPLANT
TRANSDUCER W/STOPCOCK (MISCELLANEOUS) ×2 IMPLANT
TUBING CIL FLEX 10 FLL-RA (TUBING) ×2 IMPLANT
WIRE ASAHI PROWATER 180CM (WIRE) ×1 IMPLANT
WIRE EMERALD 3MM-J .035X150CM (WIRE) ×1 IMPLANT

## 2019-05-18 NOTE — Progress Notes (Signed)
It was noticed that the flow rate on the arctic sun continued to be low.  After trouble shooting, the machine and pads were changed out.  The patient had already reached the goal of 33 degrees. All of this happened over a matter of about 15 minutes.  Good flows were restored and patient continued cooling process.

## 2019-05-18 NOTE — Progress Notes (Signed)
Pt transported from Cath lab to 2H16 on ventilator without complications. RT will continue to monitor and titrated FiO2 as tolerated.

## 2019-05-18 NOTE — Progress Notes (Signed)
Inpatient Diabetes Program Recommendations  AACE/ADA: New Consensus Statement on Inpatient Glycemic Control (2015)  Target Ranges:  Prepandial:   less than 140 mg/dL      Peak postprandial:   less than 180 mg/dL (1-2 hours)      Critically ill patients:  140 - 180 mg/dL   Results for Jeffrey [name], Jeffrey [name] (MRN [id number]) as of 05/08/2019 11:23  Ref. Range 05/01/2019 06:37  Glucose-Capillary Latest Ref Range: 70 - 99 mg/dL 288 (H)    Admit with: Cardiac arrest with 35 min downtime  History: ESRD      MD- Note Lab Glucose and CBG elevated this AM.  NO History of Diabetes noted.  Please consider starting Phase 1 of the ICU Glycemic Control Protocol     --Will follow patient during hospitalization--  Wyn Quaker RN, MSN, CDE Diabetes Coordinator Inpatient Glycemic Control Team Team Pager: [phone] (8a-5p)

## 2019-05-18 NOTE — Progress Notes (Addendum)
[facility] Progress Note Patient Name: Jeffrey [name] DOB: [birth year] MRN: [id number]   Date of Service  04/29/2019  HPI/Events of Note  Pt is oozing at the central line insertion site, Pt was recently on an anti-platelet agent. Recent hemoglobin is 9.2 gm.  eICU Interventions  Order entered for Thrombin 20,000 units topically + bedside RN instructed to apply pressure dressing over the thrombin.        Kerry Kass Ogan 05/17/2019, 11:38 PM

## 2019-05-18 NOTE — Progress Notes (Signed)
  Echocardiogram 2D Echocardiogram has been performed.  Jeffrey [name] 05/21/2019, 2:04 PM

## 2019-05-18 NOTE — Progress Notes (Signed)
Patient arrived on 2H from cath lab and ED with a temp foley. Patient has a h/o ESRD on hemo HD. Esophageal probe was established. Patient was bladder scanned -- 50 mL. Foley removed per protocol.

## 2019-05-18 NOTE — Procedures (Signed)
Critical care central line insertion note  Indication: Limited IV access  Consent obtained: From brother  Preprocedural timeout called: Yes  Full aseptic bundle used: Full drape and gown chlorhexidine prep  Ultrasound guidance: Yes  Line inserted: 7 French 20 cm triple-lumen catheter  Insertion depth: 19 cm  Vessel used: Left internal jugular  Number of attempts: 1  Line placement confirmation: Wire visualized in vessel  X-ray performed: Demonstrates catheter tip in  brachiocephalic vein  Kipp Brood, MD [facility] ICU Physician [place]  Pager: [phone] Mobile: [phone] After hours: [phone].  11/14/2018, 6:28 PM

## 2019-05-18 NOTE — Progress Notes (Deleted)
Patient arrived from cath lab and ED with temp foley.  Patient has h/o HD and was bladder scanned (50 mL).  Esophageal probe was placed for TTM and foley removed per protocol.

## 2019-05-18 NOTE — Consult Note (Addendum)
[facility] Renal Consultation Note    Indication for Consultation:  Management of ESRD/hemodialysis, anemia, hypertension/volume, and secondary hyperparathyroidism. PCP:  HPI: Jeffrey [name] is a 63 y.o. male with ESRD (on home HD), Hep B, HTN, HIV, Hx seizure disorder, Hx AVR (07/2018), Hx VATS for fibrothorax (10/2018), hypothyroidism, and CAD (prior diffuse non-obstructive disease) was admitted s/p V-Fib arrest; found to have STEMI.  Pt seen in ICU bed, sedated/intubated. Per notes, was found unresponsive in bed this morning by his brother. EMS called - CPR initiated - ROSC obtained after ~35 minutes, defibrillation x 8, Epi x 5, Ca, Bicarb, and amiodarone. EKG showed STEMI - taken to cath lab emergently where he was found to have 100% mid-LAD and 80% distal-LAD occlusions, which were treated with DES and balloon angioplasty. Transferred to cardiac ICU and currently undergoing cooling protocol. Most recent labs show K 3.2, Glu 249, WBC 13.3, Hgb 9.9. Repeat CO2, blood gas pending.   Brother not available in room to discuss. Has been doing home HD for years via L AVF. Spoke to brother Jeffrey [name]) by telephone. He reports no recent dialysis issues - still on 4d/week schedule, 3.5hr, usually 2L per treatment (EDW ~79kg). Last was dialyzed on Wed evening (yesterday).  Past Medical History:  Diagnosis Date  . Anemia   . Anxiety   . Aortic valve stenosis   . Arthritis   . Asthma    per pt hx  . Dyspnea   . End stage renal disease on home HD 07/10/2011   Started HD in September 2012 at [facility] with a tunneled HD catheter, now on home HD with NxtStage. Dialyzing through AVF L lower arm with buttonhole technique as of mid 2014. His brother does the HD treatments at home.  They are roommates for 23 years.  The brother works 3rd shift and gets off about 8am and then puts Jeffrey [name] on HD in the morning after getting home. Most of the time he does HD about 4 times a week, for about 4 hours per  treatment. Cause of ESRD was HTN according to patient. He says he let his health go and ending up with complications, and that he didn't like seeing doctors in those days.  He says he was diagnosed with severe HTN when he lived in [place] in his 2's.   . Hepatitis B carrier ([place])   . HIV infection ([place])   . Hypertension   . Hyperthyroidism    normal now  . Pneumonia several yrs ago  . Seizures ([place]) 06/02/2011   had a mild one in Sept. 2019  . Sleep apnea    to start Cpap soon  . Thrombocytopenia ([place])    Past Surgical History:  Procedure Laterality Date  . AORTIC VALVE REPLACEMENT N/A 07/28/2018   Procedure: AORTIC VALVE REPLACEMENT (AVR) using Inspiris Resilia  size 23 Aortic Valve.;  Surgeon: [name], MD;  Location: [place];  Service: Open Heart Surgery;  Laterality: N/A;  . AV FISTULA PLACEMENT  06/02/11   Left radiocephalic AVF  . BIOPSY  02/17/2018   Procedure: BIOPSY;  Surgeon: Milus Banister, MD;  Location: [facility];  Service: Endoscopy;;  . CHEST TUBE INSERTION Left 11/04/2018   Procedure: Chest Tube Insertion;  Surgeon: [name], MD;  Location: [place];  Service: Thoracic;  Laterality: Left;  . COLONOSCOPY    . COLONOSCOPY WITH PROPOFOL N/A 01/27/2018   Procedure: COLONOSCOPY WITH PROPOFOL;  Surgeon: [name], MD;  Location: [facility]  ENDOSCOPY;  Service: Gastroenterology;  Laterality: N/A;  . CORONARY ANGIOGRAPHY N/A 04/27/2019   Procedure: CORONARY ANGIOGRAPHY;  Surgeon: Jettie Booze, MD;  Location: [place] CV LAB;  Service: Cardiovascular;  Laterality: N/A;  . CORONARY ANGIOPLASTY    . CORONARY/GRAFT ACUTE MI REVASCULARIZATION N/A 04/24/2019   Procedure: CORONARY/GRAFT ACUTE MI REVASCULARIZATION;  Surgeon: Jettie Booze, MD;  Location: [place] CV LAB;  Service: Cardiovascular;  Laterality: N/A;  . DECORTICATION Left 11/04/2018   Procedure: Left Lung DECORTICATION;  Surgeon: [name], MD;  Location: [place];   Service: Thoracic;  Laterality: Left;  . ERCP  03/21/2018   AT [place]  . ESOPHAGOGASTRODUODENOSCOPY N/A 02/17/2018   Procedure: ESOPHAGOGASTRODUODENOSCOPY (EGD);  Surgeon: Milus Banister, MD;  Location: [facility];  Service: Endoscopy;  Laterality: N/A;  . ESOPHAGOGASTRODUODENOSCOPY (EGD) WITH PROPOFOL N/A 01/27/2018   Procedure: ESOPHAGOGASTRODUODENOSCOPY (EGD) WITH PROPOFOL;  Surgeon: [name], MD;  Location: [facility] ENDOSCOPY;  Service: Gastroenterology;  Laterality: N/A;  . ESOPHAGOGASTRODUODENOSCOPY (EGD) WITH PROPOFOL N/A 04/03/2018   Procedure: ESOPHAGOGASTRODUODENOSCOPY (EGD) WITH PROPOFOL;  Surgeon: Clarene Essex, MD;  Location: [place];  Service: Endoscopy;  Laterality: N/A;  . EUS N/A 02/17/2018   Procedure: UPPER ENDOSCOPIC ULTRASOUND (EUS) RADIAL;  Surgeon: Milus Banister, MD;  Location: [facility];  Service: Endoscopy;  Laterality: N/A;  . EYE SURGERY Left 05/2018   cataract surgery   . fistulaogram     x 2 last 2 years  . IR DIALY SHUNT INTRO NEEDLE/INTRACATH INITIAL W/IMG LEFT Left 04/20/2018  . IR FLUORO GUIDE CV LINE RIGHT  09/16/2017  . IR THORACENTESIS ASP PLEURAL SPACE W/IMG GUIDE  10/04/2018  . IR US GUIDE VASC ACCESS RIGHT  09/16/2017  . IRRIGATION AND DEBRIDEMENT KNEE Left 09/15/2017   Procedure: IRRIGATION AND DEBRIDEMENT KNEE; arthroscopic clean out;  Surgeon: [name], MD;  Location: [facility];  Service: Orthopedics;  Laterality: Left;  . OTHER SURGICAL HISTORY     removal temporary HD catheter   . PLEURAL EFFUSION DRAINAGE Left 11/04/2018   Procedure: DRAINAGE OF PLEURAL EFFUSION;  Surgeon: [name], MD;  Location: [place];  Service: Thoracic;  Laterality: Left;  . REVISON OF ARTERIOVENOUS FISTULA Left 10/10/2015   Procedure: REVISON OF LEFT RADIOCEPHALIC ARTERIOVENOUS FISTULA;  Surgeon: Angelia Mould, MD;  Location: [place];  Service: Vascular;  Laterality: Left;  . REVISON OF ARTERIOVENOUS FISTULA Left 02/07/2016   Procedure: REPAIR  OF PSEUDO-ANEUREYSM OF LEFT ARM  ARTERIOVENOUS FISTULA;  Surgeon: Angelia Mould, MD;  Location: [place];  Service: Vascular;  Laterality: Left;  . REVISON OF ARTERIOVENOUS FISTULA Left 02/20/931   Procedure: PLICATION OF LEFT ARM RADIOCEPHALIC ARTERIOVENOUS FISTULA PSEUDOANEURYSM;  Surgeon: Angelia Mould, MD;  Location: [place];  Service: Vascular;  Laterality: Left;  . REVISON OF ARTERIOVENOUS FISTULA Left 07/04/2018   Procedure: REVISION OF ARTERIOVENOUS RADIOCEPHALIC FISTULA;  Surgeon: Angelia Mould, MD;  Location: [place];  Service: Vascular;  Laterality: Left;  . RIGHT/LEFT HEART CATH AND CORONARY ANGIOGRAPHY N/A 02/17/2017   Procedure: Right/Left Heart Cath and Coronary Angiography;  Surgeon: Sherren Mocha, MD;  Location: [place] CV LAB;  Service: Cardiovascular;  Laterality: N/A;  . RIGHT/LEFT HEART CATH AND CORONARY ANGIOGRAPHY N/A 07/25/2018   Procedure: RIGHT/LEFT HEART CATH AND CORONARY ANGIOGRAPHY;  Surgeon: Leonie Man, MD;  Location: [place] [facility];  Service: Cardiovascular;  Laterality: N/A;  . TEE WITHOUT CARDIOVERSION N/A 09/11/2016   Procedure: TRANSESOPHAGEAL ECHOCARDIOGRAM (TEE);  Surgeon: Dorothy Spark, MD;  Location: [facility];  Service: Cardiovascular;  Laterality: N/A;  . TEE WITHOUT CARDIOVERSION N/A 05/05/2018   Procedure: TRANSESOPHAGEAL ECHOCARDIOGRAM (TEE);  Surgeon: Adrian Prows, MD;  Location: [place];  Service: Cardiovascular;  Laterality: N/A;  Prefer after 3:30 PM  . TEE WITHOUT CARDIOVERSION N/A 07/28/2018   Procedure: TRANSESOPHAGEAL ECHOCARDIOGRAM (TEE);  Surgeon: [name], MD;  Location: [place];  Service: Open Heart Surgery;  Laterality: N/A;  . VIDEO ASSISTED THORACOSCOPY Left 11/04/2018   Procedure: VIDEO ASSISTED THORACOSCOPY;  Surgeon: [name], MD;  Location: [place];  Service: Thoracic;  Laterality: Left;  Marland Kitchen VIDEO ASSISTED THORACOSCOPY (VATS)/THOROCOTOMY Left 11/04/2018   Family History   Problem Relation Age of Onset  . Hypertension Mother   . Diabetes Mother   . Cancer Father    Social History:  reports that he quit smoking about 6 years ago. His smoking use included cigarettes. He quit after 10.00 years of use. He has never used smokeless tobacco. He reports that he does not drink alcohol or use drugs.  ROS: As per HPI otherwise negative.  Physical Exam: Vitals:   04/25/2019 1140 04/28/2019 1200 04/26/2019 1217 04/24/2019 1300  BP: 111/60 129/71  (!) 84/60  Pulse: 64 60 70 64  Resp:  14 15 11   Temp:   (!) 89.6 F (32 C)   TempSrc:   Esophageal   SpO2: 100% 100% (!) 88% 100%  Weight:      Height:         General: Ill appearing, orally intubated/sedated. OG tube, cooling pads in place. Head: Normocephalic, atraumatic. Neck: Supple without lymphadenopathy/masses. JVD not elevated. Lungs: Clear bilaterally to auscultation anteriorly Heart: RRR with normal S1, S2. No murmurs, rubs, or gallops appreciated. Abdomen: Soft - unable to fully examine d/t cooling pads. Musculoskeletal:  Strength and tone appear normal for age. Lower extremities: No edema or ischemic changes, no open wounds. Neuro: Sedated. Dialysis Access: L forearm AVF + thrill  Allergies  Allergen Reactions  . Lisinopril Anaphylaxis and Shortness Of Breath    Throat swelling  . Penicillins Anaphylaxis and Other (See Comments)    Childhood allergy Has patient had a PCN reaction causing immediate rash, facial/tongue/throat swelling, SOB or lightheadedness with hypotension: Yes Has patient had a PCN reaction causing severe rash involving mucus membranes or skin necrosis: Unk Has patient had a PCN reaction that required hospitalization: Unk Has patient had a PCN reaction occurring within the last 10 years: No If all of the above answers are "NO", then may proceed with Cephalosporin use.   . Fentanyl Other (See Comments)    Lethargy, AMS  . Morphine And Related Other (See Comments)    "Not Himself"    Prior to Admission medications   Medication Sig Start Date End Date Taking? Authorizing Provider  acetaminophen (TYLENOL) 325 MG tablet Take 2 tablets (650 mg total) by mouth every 6 (six) hours as needed for fever, headache, mild pain or moderate pain. 08/17/18   Elgie Collard, PA-C  albuterol (PROVENTIL) (2.5 MG/3ML) 0.083% nebulizer solution Take 3 mLs (2.5 mg total) by nebulization every 6 (six) hours as needed for wheezing or shortness of breath. 09/05/18   Medina-Vargas, Monina C, NP  Amino Acids-Protein Hydrolys (FEEDING SUPPLEMENT, PRO-STAT SUGAR FREE 64,) LIQD Take 30 mLs by mouth daily.    [provider]  amLODipine (NORVASC) 10 MG tablet TAKE 1 TABLET BY MOUTH DAILY 05/12/19   Aldine Contes, MD  aspirin EC 81 MG tablet Take 81 mg by  mouth daily.    [provider]  BIKTARVY 50-200-25 MG TABS tablet TAKE 1 TABLET BY MOUTH DAILY 12/27/18   Campbell Riches, MD  cloNIDine (CATAPRES - DOSED IN MG/24 HR) 0.3 mg/24hr patch Place 1 patch (0.3 mg total) onto the skin once a week. 04/18/19   Axel Filler, MD  diclofenac sodium (VOLTAREN) 1 % GEL Apply 2 g topically 4 (four) times daily as needed (Pain). 03/10/19   Neva Seat, MD  Epoetin Alfa (EPOGEN IJ) Inject as directed. THREE TIMES A WEEK AT DIALYSIS    [provider]  ferric gluconate 125 mg in sodium chloride 0.9 % 100 mL Inject 125 mg into the vein every Monday, Wednesday, and Friday with hemodialysis. 08/22/18   Gold, Patrick Jupiter E, PA-C  hydrALAZINE (APRESOLINE) 100 MG tablet Take 1 tablet (100 mg total) by mouth 3 (three) times daily. 11/15/18   Velna Ochs, MD  iron sucrose (VENOFER) 20 MG/ML injection Inject 500 mg into the vein every 14 (fourteen) days.    [provider]  isosorbide mononitrate (ISMO,MONOKET) 20 MG tablet Take 1 tablet (20 mg total) by mouth 2 (two) times daily at 10 AM and 5 PM. 11/15/18   Velna Ochs, MD  Lacosamide (VIMPAT) 150 MG TABS Take 1 tablet  (150 mg total) by mouth 2 (two) times daily. 04/06/19   Suzzanne Cloud, NP  lactulose (CHRONULAC) 10 GM/15ML solution Take 30 mLs (20 g total) by mouth daily as needed for moderate constipation. 11/25/18   Velna Ochs, MD  losartan (COZAAR) 25 MG tablet Take 25 mg by mouth daily.    [provider]  multivitamin (RENA-VIT) TABS tablet Take 1 tablet by mouth daily. 09/05/18   Medina-Vargas, Monina C, NP  NONFORMULARY OR COMPOUNDED ITEM [facility] Terbinafine 3% Fluconazole 2% Tea Tree Oil 5% Urea 10% Ibuprofen 2% in DMSO suspension #11mL [id number] to the affected nail(S) once (at bedtime) Antifungal 03/01/19   Marzetta Board, DPM  omeprazole (PRILOSEC) 20 MG capsule Take 1 capsule (20 mg total) by mouth daily. 11/15/18   Velna Ochs, MD  traMADol (ULTRAM) 50 MG tablet Take 1 tablet (50 mg total) by mouth every 12 (twelve) hours as needed. 02/16/19   [name], MD  traZODone (DESYREL) 50 MG tablet TAKE 1 TABLET(50 MG) BY MOUTH AT BEDTIME AS NEEDED FOR SLEEP 02/14/19   Campbell Riches, MD   Current Facility-Administered Medications  Medication Dose Route Frequency Provider Last Rate Last Dose  . 0.9 %  sodium chloride infusion   Intravenous Continuous Jettie Booze, MD 50 mL/hr at 04/29/2019 1449    . 0.9 %  sodium chloride infusion  250 mL Intravenous PRN Jettie Booze, MD      . acetaminophen (TYLENOL) tablet 650 mg  650 mg Oral Q4H PRN Jettie Booze, MD      . albuterol (PROVENTIL) (2.5 MG/3ML) 0.083% nebulizer solution 2.5 mg  2.5 mg Nebulization Q6H PRN Jettie Booze, MD      . amiodarone (NEXTERONE PREMIX) 360-4.14 MG/200ML-% (1.8 mg/mL) IV infusion  30 mg/hr Intravenous Continuous Jettie Booze, MD 16.67 mL/hr at 05/15/2019 1439 30 mg/hr at 05/13/2019 1439  . artificial tears (LACRILUBE) ophthalmic ointment 1 application  1 application Both Eyes T5T Jettie Booze, MD      . aspirin chewable tablet 81 mg  81 mg Oral  Daily Jettie Booze, MD      . bictegravir-emtricitabine-tenofovir AF (BIKTARVY) 50-200-25 MG per tablet 1  tablet  1 tablet Oral Daily Jettie Booze, MD      . Chlorhexidine Gluconate Cloth 2 % PADS 6 each  6 each Topical Daily Kipp Brood, MD   6 each at 05/19/2019 0915  . cisatracurium (NIMBEX) 200 mg in sodium chloride 0.9 % 200 mL (1 mg/mL) infusion  1-1.5 mcg/kg/min Intravenous Titrated Kipp Brood, MD 5.22 mL/hr at 05/08/2019 1441 1 mcg/kg/min at 05/22/2019 1441  . cisatracurium (NIMBEX) bolus via infusion 4.4 mg  0.05 mg/kg Intravenous PRN Agarwala, Einar Grad, MD      . fentaNYL (SUBLIMAZE) bolus via infusion 25 mcg  25 mcg Intravenous Q30 min PRN Jettie Booze, MD      . fentaNYL (SUBLIMAZE) injection 50 mcg  50 mcg Intravenous Once Jettie Booze, MD      . fentaNYL 2550mcg in NS 252mL (6mcg/ml) infusion-PREMIX  100-300 mcg/hr Intravenous Continuous Jettie Booze, MD 10 mL/hr at 05/12/2019 1434 100 mcg/hr at 04/30/2019 1434  . heparin injection 5,000 Units  5,000 Units Subcutaneous Q8H Larae Grooms S, MD      . hydrALAZINE (APRESOLINE) injection 10 mg  10 mg Intravenous Q20 Min PRN Jettie Booze, MD   10 mg at 05/13/2019 0949  . insulin regular, human (MYXREDLIN) 100 units/ 100 mL infusion   Intravenous Continuous Agarwala, Ravi, MD      . labetalol (NORMODYNE) injection 10 mg  10 mg Intravenous Q10 min PRN Jettie Booze, MD      . midazolam (VERSED) injection 2 mg  2 mg Intravenous Q15 min PRN Jettie Booze, MD   2 mg at 05/14/2019 1203  . midazolam (VERSED) injection 2 mg  2 mg Intravenous Q2H PRN Jettie Booze, MD      . norepinephrine (LEVOPHED) 4mg  in 216mL premix infusion  0-50 mcg/min Intravenous Titrated Jettie Booze, MD   Stopped at 05/01/2019 0915  . ondansetron (ZOFRAN) injection 4 mg  4 mg Intravenous Q6H PRN Jettie Booze, MD      . pantoprazole (PROTONIX) injection 40 mg  40 mg Intravenous QHS Larae Grooms S, MD      . potassium chloride 10 mEq in 50 mL *CENTRAL LINE* IVPB  10 mEq Intravenous Q1 Hr x 4 Salvadore Dom E, NP      . propofol (DIPRIVAN) 1000 MG/100ML infusion  25-80 mcg/kg/min Intravenous Continuous Jettie Booze, MD 10.44 mL/hr at 05/01/2019 1447 20 mcg/kg/min at 05/17/2019 1447  . sodium chloride flush (NS) 0.9 % injection 3 mL  3 mL Intravenous Q12H Larae Grooms S, MD      . sodium chloride flush (NS) 0.9 % injection 3 mL  3 mL Intravenous PRN Jettie Booze, MD      . Thrombi-Pad 3"X3" pad 1 each  1 each Topical Once Agarwala, Ravi, MD      . ticagrelor (BRILINTA) tablet 90 mg  90 mg Oral BID Jettie Booze, MD       Facility-Administered Medications Ordered in Other Encounters  Medication Dose Route Frequency Provider Last Rate Last Dose  . etomidate (AMIDATE) injection    Anesthesia Intra-op Myna Bright, CRNA   16 mg at 07/10/18 2031  . rocuronium bromide 10 mg/mL (PF) syringe   Intravenous Anesthesia Intra-op Myna Bright, CRNA   50 mg at 07/10/18 2031   Labs: Basic Metabolic Panel: Recent Labs  Lab 05/14/2019 0647 04/23/2019 0648  05/09/2019 0658 04/28/2019 0713 05/15/2019 1020  NA 136  --    < >  138 138 138  K 3.7  --    < > 3.2* 3.2* 3.2*  CL 96*  --   --   --  98 100  CO2 14*  --   --   --  17*  --   GLUCOSE 283*  --   --   --  259* 249*  BUN 83*  --   --   --  83* 88*  CREATININE 14.39* 14.20*  --   --  13.12* 12.90*  CALCIUM 9.3  --   --   --  8.3*  --    < > = values in this interval not displayed.   CBC: Recent Labs  Lab 05/08/2019 0647  05/07/2019 0658 04/22/2019 1007 05/14/2019 1020  WBC 11.5*  --   --  13.3*  --   HGB 8.7*   < > 8.8* 8.8* 9.9*  HCT 28.4*   < > 26.0* 27.5* 29.0*  MCV 98.3  --   --  94.2  --   PLT 265  --   --  200  --    < > = values in this interval not displayed.   CBG: Recent Labs  Lab 05/01/2019 0637 05/09/2019 1227  GLUCAP 288* 225*   Studies/Results: Dg Chest Port 1 View  Result Date:  04/28/2019 CLINICAL DATA:  central line placement EXAM: PORTABLE CHEST 1 VIEW COMPARISON:  Chest radiograph dated 05/02/2019 FINDINGS: Interval placement of a left central venous catheter with tip overlying the brachiocephalic vein. Esophageal probe tip overlies the mid/upper esophagus. Endotracheal tube tip lies approximately 2 cm above the carina. Nasogastric tube remains in place. Stable enlarged cardiomediastinal contours status post median sternotomy and valve replacement. Scattered left lung linear opacities likely reflect atelectasis. No definite pleural effusion though the left costophrenic angle is excluded from field of view. No pneumothorax. IMPRESSION: Interval placement of left central venous catheter with tip overlying the brachiocephalic vein. Consider advancement into the SVC. Electronically Signed   By: Audie Pinto M.D.   On: 05/17/2019 13:38   Dg Chest Portable 1 View  Result Date: 05/06/2019 CLINICAL DATA:  Intubation.  Status post CPR. EXAM: PORTABLE CHEST 1 VIEW COMPARISON:  01/31/2019. FINDINGS: Endotracheal tube noted with tip 3 cm above the carina. NG tube noted with tip below hemidiaphragm. Prior CABG and cardiac valve replacement. Cardiomegaly with mild bilateral interstitial prominence. Mild CHF cannot be excluded. No prominent pleural effusion. No pneumothorax. No acute bony abnormality. IMPRESSION: 1. Endotracheal tube noted with tip 3 cm above the carina. NG tube noted with tip below left hemidiaphragm. 2. Prior CABG and cardiac valve replacement. Cardiomegaly with mild bilateral interstitial prominence. Mild CHF cannot be excluded. Electronically Signed   By: Marcello Moores  Register   On: 05/20/2019 07:32    Dialysis Orders:  HHD - 4d/week, 3.5hr, EDW ~79kg, AVF with buttonholes. Last dialysis 8/26. Outpatient Nephrologist is Dr. Holley Raring  Assessment/Plan: 1.  V-Fib cardiac arrest: > 30 min down time, cooling protocol in place. Will await neuro recovery. 2.  STEMI: S/p  emergent LHC - 100% occluded mid-LAD, DES placed. 80% occluded distal LAD - opened with balloon angioplasty. Per cardiology. Echo pending. 3.  ESRD: On home HD x years. Last HD yesterday. No volume or hyperkalemia issues - K actually slightly low/supplemented. Very acidotic early today - repeat CO2 + blood gas pending. If remains acidotic, will consider CRRT but for now, will not order anything - plan to observe overnight and reassess in AM. 4.  BP/volume: BP  stable - euvolemic. Off Norepi at the moment. 5.  Anemia: Hgb 9.9 - follow. 6.  Metabolic bone disease: Ca ok, - follow. 7.   HIV 8.  Hep B 9.  Hx seizure disorder  Veneta Penton, Hershal Coria 05/13/2019, 2:51 PM  [facility] Pager: [phone]  I have seen and examined this patient and agree with plan and assessment in the above note with renal recommendations/intervention highlighted.  Case reviewed with K. STovall, PA-C.  Currently too unstable for intermittent HD but will hold off on CVVHD as his volume and electrolytes appear stable. Will recheck ABG and pH to make decision regarding CVVHD tonight.  If stable, will follow and provide IHD as needed/tolerated.  Governor Rooks Lovie Agresta,MD 05/03/2019 3:42 PM

## 2019-05-18 NOTE — ED Notes (Signed)
Pt transported to Cath lab with RT & 2H RN.

## 2019-05-18 NOTE — Progress Notes (Signed)
[facility] Progress Note Patient Name: Jeffrey [name] DOB: Aug 03, 1956 MRN: [id number]   Date of Service  05/20/2019  HPI/Events of Note  Bradycardia with pauses on Amiodarone infusion in patient originally admitted for v-ib arrest in the context of acute coronary syndrome.  eICU Interventions  I have asked the bedside RN to follow up  with the patient's cardiologist regarding management of the Amiodarone infusion.        Kerry Kass Ogan 05/10/2019, 8:40 PM

## 2019-05-18 NOTE — Progress Notes (Signed)
Initial Nutrition Assessment  DOCUMENTATION CODES:   Not applicable  INTERVENTION:   Tube feeding:  -Vital High Protein @ 20 ml/hr via OGT -Increase by 10 ml Q4 hours to goal rate of 65 ml/hr (1560 ml) -Renal MVI daily   At goal TF provides: 1560 kcals (1835 kcal with propofol), 137 grams protein, 1304 ml free water. Meets 104% kcal needs and 100% of protein needs   NUTRITION DIAGNOSIS:   Increased nutrient needs related to post-op healing as evidenced by estimated needs.  GOAL:   Patient will meet greater than or equal to 90% of their needs  MONITOR:   Diet advancement, Vent status, Labs, Weight trends, TF tolerance, Skin, I & O's  REASON FOR ASSESSMENT:   Ventilator    ASSESSMENT:   Patient with PMH significant for ESRD on home dialysis, AVS, Hepatitis B, HIV, HTN, and seizures. Presents this admission with VF arrest in setting of severe CAD.   8/27- s/p L heart cath  Pt discussed during ICU rounds and with RN.   Undergoing TTM. Requiring pressors. On nimbex. Spoke with CCM, okay to start trickle feeds.   Per chart records weight has trended up from 74.3kg to 87 kg over the last six months. Suspect this could be fluid related. Unsure of EDW. Will use 87 kg to estimate needs.   Patient is currently intubated on ventilator support MV: 7.6 L/min Temp (24hrs), Avg:94.9 F (34.9 C), Min:89.6 F (32 C), Max:97.4 F (36.3 C)  Propofol: 10.4 ml/hr- provides 275 kcal daily   No I/O documented.   Drips: amiodarone, nimbex, propofol, NS @ 50 ml/hr  Labs: K 3.2 (L) CBG [phone]   NUTRITION - FOCUSED PHYSICAL EXAM:    Most Recent Value  Orbital Region  No depletion  Upper Arm Region  No depletion  Thoracic and Lumbar Region  Unable to assess  Buccal Region  No depletion  Temple Region  Mild depletion  Clavicle Bone Region  Mild depletion  Clavicle and Acromion Bone Region  Mild depletion  Scapular Bone Region  Unable to assess  Dorsal Hand  No depletion   Patellar Region  No depletion  Anterior Thigh Region  Unable to assess  Posterior Calf Region  Mild depletion  Edema (RD Assessment)  None  Hair  Reviewed  Eyes  Unable to assess  Mouth  Unable to assess  Skin  Reviewed  Nails  Reviewed     Diet Order:   Diet Order            Diet NPO time specified  Diet effective now              EDUCATION NEEDS:   Not appropriate for education at this time  Skin:  Skin Assessment: Reviewed RN Assessment  Last BM:  PTA  Height:   Ht Readings from Last 1 Encounters:  05/01/2019 6' (1.829 m)    Weight:   Wt Readings from Last 1 Encounters:  05/10/2019 87 kg    Ideal Body Weight:  80.9 kg  BMI:  Body mass index is 26.01 kg/m.  Estimated Nutritional Needs:   Kcal:  1761 kcal  Protein:  125-145 grams  Fluid:  1000 ml +UOP   Mariana Single RD, LDN Clinical Nutrition Pager # [phone]

## 2019-05-18 NOTE — H&P (Signed)
NAME:  NOMAR BROAD, MRN:  [id number], DOB:  1956-05-27, LOS: 0 ADMISSION DATE:  05/04/2019, CONSULTATION DATE:  8/27REFERRING MD:  cardama, CHIEF COMPLAINT:  Cardiac arrest   Brief History   63 year old male patient with end-stage renal disease on home dialysis admitted 8/27 status post VF arrest estimated time to ROSC 35 minutes.  History of present illness   63 year old male patient with known history of end-stage renal disease on home dialysis, found unresponsive in his bed by his brother.  Review of medical records show he had actually contacted thoracic surgery on 8/26 with chief complaint of left shoulder pain.  He was requesting pain medication and was referred to his primary care provider. -On EMS arrival the patient was found to be in ventricular fibrillation, ACLS was initiated.  He received 5 rounds of CPR, multiple defibrillations, loaded with amiodarone, and 5 rounds of epinephrine.  He was intubated with a King airway.  Time to return of spontaneous circulation was estimated at 35 minutes.  Presented to the emergency room at [facility] on 8/27, airway was exchanged, cardiology team was mobilized, and he was transported to the cardiac Cath Lab emergently. Cardiac catheter findings: Mid LAD lesion 100% stenosis, drug-eluting stent was successfully placed.  A distal/apical LAD lesion was stenosed at 80% and this was treated with PTCA.  He had nonobstructive disease involving the circumflex and RCA. Further recommendations were to continue on with uninterrupted dual antiplatelet therapy for at least 12 months. He returned to the intensive care on full ventilatory support. Past Medical History  Prior aortic valve insufficiency, status post aortic valve endocarditis requiring aortic valve replacement 2019 Known diffuse nonobstructive coronary artery disease Hepatitis B End-stage renal disease on home dialysis Hypothyroidism Peripheral vascular disease Prior GI bleed Coronary artery  disease Prior left VATS for fibrothorax following bacteremia in February 2020  [place] [facility] Events   8/27: Admitted status post VF arrest.  Estimated downtime 35 minutes.  Went for left heart cath, findings:Mid LAD lesion 100% stenosis, drug-eluting stent was successfully placed.  A distal/apical LAD lesion was stenosed at 80% and this was treated with PTCA.  He had nonobstructive disease involving the circumflex and RCA.  Hypothermia protocol initiated at: 1104 am temp goal reached   Consults:  Cardiology   Procedures:  oett 8/27>>> Significant Diagnostic Tests:  Left heart cath 8/28: Mid LAD lesion 100% stenosis, drug-eluting stent was successfully placed.  A distal/apical LAD lesion was stenosed at 80% and this was treated with PTCA.  He had nonobstructive disease involving the circumflex and RCA. EEG 8/27>>>   Micro Data:  Corona virus 8/27: neg Respiratory culture 8/28>>>  Antimicrobials:    Interim history/subjective:  Now s/p cardiac cath. Sedated and paralyzed   Objective   Blood pressure (Abnormal) 151/62, pulse 85, temperature (Abnormal) 97.4 F (36.3 C), resp. rate 14, height 6' (1.829 m), weight 87 kg, SpO2 100 %.    Vent Mode: PRVC FiO2 (%):  [100 %] 100 % Set Rate:  [14 bmp-16 bmp] 14 bmp Vt Set:  [540 mL-620 mL] 620 mL PEEP:  [5 cmH20-8 cmH20] 5 cmH20 Plateau Pressure:  [16 cmH20] 16 cmH20  No intake or output data in the 24 hours ending 05/11/2019 1147 Filed Weights   04/24/2019 0650  Weight: 87 kg    Examination:  General: Well-nourished 63 year old black male currently unresponsive/sedated/paralyzed on full ventilator support HENT: Orally intubated pupils reactive Lungs: Equal chest rise clear Cardiovascular: Regular rate and rhythm Abdomen: Soft nontender Extremities:  Cool, right lower extremity intraosseous catheter in place Neuro: GCS 3 GU: Anuric  Resolved Hospital Problem list     Assessment & Plan:   VF arrest in setting of severe  CAD, now s/p DES to mid LAD and PTCA to distal/apical LAD lesion -Known history of coronary artery disease with prior CABG and aortic valve replacement Plan Admit to CICU Cont tele F/u ECHO Amiodarone per cards Cycle CEs Cont uninterrupted dual antiplatelet therapy for at least 12 months. Further recs per cards  Acute respiratory failure s/p cardiopulm arrest.  Chest x-ray personally reviewed: Ett good position, mild pulmonary edema Plan Full ventilator support PAD protocol RA SS goal of -5 given hypothermia protocol VAP bundle Check sputum culture Low threshold for antibiotics to cover for aspiration  Acute metabolic encephalopathy: At risk for anoxic injury Plan Initiating hypothermia protocol CT brain EEG Further neurodiagnostics pending rewarming phase  End-stage renal disease - on home dialysis Plan Will ask nephrology to evaluate, I do not see an emergent need for dialysis currently  Fluid and electrolyte balance: Hypokalemia Plan Repeat chemistry and replace as needed  Anemia of chronic disease Plan Trend CBC  Leukocytosis Plan Sputum culture Trend CBC  Hyperglycemia Plan Hyperglycemia protocol   H/o  hep B plan Trend LFTs  History of hypothyroidism Plan Continue replacement   Best practice:  Diet: NPO Pain/Anxiety/Delirium protocol (if indicated): hypothermia protocol 8/27 VAP protocol (if indicated): 8/27 DVT prophylaxis: [place] heparin  GI prophylaxis: PPI Glucose control: 8/27 Mobility: BR Code Status: full code Family Communication: brother updated Disposition: critically ill status post cardiac arrest.  Initiating hypothermia..,  Labs   CBC: Recent Labs  Lab 05/01/2019 0647 05/01/2019 0649 04/24/2019 0658 05/01/2019 1007 04/23/2019 1020  WBC 11.5*  --   --  13.3*  --   HGB 8.7* 9.5* 8.8* 8.8* 9.9*  HCT 28.4* 28.0* 26.0* 27.5* 29.0*  MCV 98.3  --   --  94.2  --   PLT 265  --   --  200  --     Basic Metabolic Panel: Recent Labs  Lab  04/25/2019 0647 04/23/2019 0648 05/03/2019 0649 05/15/2019 0658 05/15/2019 0713 05/20/2019 1020  NA 136  --  136 138 138 138  K 3.7  --  3.6 3.2* 3.2* 3.2*  CL 96*  --   --   --  98 100  CO2 14*  --   --   --  17*  --   GLUCOSE 283*  --   --   --  259* 249*  BUN 83*  --   --   --  83* 88*  CREATININE 14.39* 14.20*  --   --  13.12* 12.90*  CALCIUM 9.3  --   --   --  8.3*  --    GFR: Estimated Creatinine Clearance: 6.3 mL/min (A) (by C-G formula based on SCr of 12.9 mg/dL (H)). Recent Labs  Lab 04/24/2019 0647 05/14/2019 0648 05/12/2019 1007  WBC 11.5*  --  13.3*  LATICACIDVEN  --  6.6*  --     Liver Function Tests: No results for input(s): AST, ALT, ALKPHOS, BILITOT, PROT, ALBUMIN in the last 168 hours. No results for input(s): LIPASE, AMYLASE in the last 168 hours. No results for input(s): AMMONIA in the last 168 hours.  ABG    Component Value Date/Time   PHART 7.185 (LL) 05/11/2019 0658   PCO2ART 41.1 04/29/2019 0658   PO2ART 55.0 (L) 05/11/2019 0658   HCO3 15.5 (L) 04/29/2019 6195  TCO2 23 05/11/2019 1020   ACIDBASEDEF 12.0 (H) 05/17/2019 0658   O2SAT 80.0 05/16/2019 0658     Coagulation Profile: Recent Labs  Lab 05/02/2019 0647 05/17/2019 0713  INR 1.3* 1.6*    Cardiac Enzymes: No results for input(s): CKTOTAL, CKMB, CKMBINDEX, TROPONINI in the last 168 hours.  HbA1C: Hgb A1c MFr Bld  Date/Time Value Ref Range Status  07/26/2018 02:59 PM 4.2 (L) 4.8 - 5.6 % Final    Comment:    (NOTE) Pre diabetes:          5.7%-6.4% Diabetes:              >6.4% Glycemic control for   <7.0% adults with diabetes   07/14/2018 05:10 AM 4.1 (L) 4.8 - 5.6 % Final    Comment:    (NOTE) Pre diabetes:          5.7%-6.4% Diabetes:              >6.4% Glycemic control for   <7.0% adults with diabetes     CBG: Recent Labs  Lab 05/11/2019 0637  GLUCAP 288*    Review of Systems:   Not able  Past Medical History  He,  has a past medical history of Anemia, Anxiety, Aortic valve  stenosis, Arthritis, Asthma, Dyspnea, End stage renal disease on home HD (07/10/2011), Hepatitis B carrier ([place]), HIV infection ([place]), Hypertension, Hyperthyroidism, Pneumonia (several yrs ago), Seizures ([place]) (06/02/2011), Sleep apnea, and Thrombocytopenia ([place]).   Surgical History    Past Surgical History:  Procedure Laterality Date  . AORTIC VALVE REPLACEMENT N/A 07/28/2018   Procedure: AORTIC VALVE REPLACEMENT (AVR) using Inspiris Resilia  size 23 Aortic Valve.;  Surgeon: [name], MD;  Location: [place];  Service: Open Heart Surgery;  Laterality: N/A;  . AV FISTULA PLACEMENT  06/02/11   Left radiocephalic AVF  . BIOPSY  02/17/2018   Procedure: BIOPSY;  Surgeon: Milus Banister, MD;  Location: [facility];  Service: Endoscopy;;  . CHEST TUBE INSERTION Left 11/04/2018   Procedure: Chest Tube Insertion;  Surgeon: [name], MD;  Location: [place];  Service: Thoracic;  Laterality: Left;  . COLONOSCOPY    . COLONOSCOPY WITH PROPOFOL N/A 01/27/2018   Procedure: COLONOSCOPY WITH PROPOFOL;  Surgeon: [name], MD;  Location: [facility];  Service: Gastroenterology;  Laterality: N/A;  . CORONARY ANGIOPLASTY    . DECORTICATION Left 11/04/2018   Procedure: Left Lung DECORTICATION;  Surgeon: [name], MD;  Location: [place];  Service: Thoracic;  Laterality: Left;  . ERCP  03/21/2018   AT [place]  . ESOPHAGOGASTRODUODENOSCOPY N/A 02/17/2018   Procedure: ESOPHAGOGASTRODUODENOSCOPY (EGD);  Surgeon: Milus Banister, MD;  Location: [facility];  Service: Endoscopy;  Laterality: N/A;  . ESOPHAGOGASTRODUODENOSCOPY (EGD) WITH PROPOFOL N/A 01/27/2018   Procedure: ESOPHAGOGASTRODUODENOSCOPY (EGD) WITH PROPOFOL;  Surgeon: [name], MD;  Location: [facility];  Service: Gastroenterology;  Laterality: N/A;  . ESOPHAGOGASTRODUODENOSCOPY (EGD) WITH PROPOFOL N/A 04/03/2018   Procedure: ESOPHAGOGASTRODUODENOSCOPY (EGD) WITH PROPOFOL;  Surgeon: Clarene Essex, MD;  Location:  [place];  Service: Endoscopy;  Laterality: N/A;  . EUS N/A 02/17/2018   Procedure: UPPER ENDOSCOPIC ULTRASOUND (EUS) RADIAL;  Surgeon: Milus Banister, MD;  Location: [facility];  Service: Endoscopy;  Laterality: N/A;  . EYE SURGERY Left 05/2018   cataract surgery   . fistulaogram     x 2 last 2 years  . IR DIALY SHUNT INTRO NEEDLE/INTRACATH INITIAL W/IMG LEFT Left 04/20/2018  . IR FLUORO GUIDE  CV LINE RIGHT  09/16/2017  . IR THORACENTESIS ASP PLEURAL SPACE W/IMG GUIDE  10/04/2018  . IR US GUIDE VASC ACCESS RIGHT  09/16/2017  . IRRIGATION AND DEBRIDEMENT KNEE Left 09/15/2017   Procedure: IRRIGATION AND DEBRIDEMENT KNEE; arthroscopic clean out;  Surgeon: [name], MD;  Location: [facility];  Service: Orthopedics;  Laterality: Left;  . OTHER SURGICAL HISTORY     removal temporary HD catheter   . PLEURAL EFFUSION DRAINAGE Left 11/04/2018   Procedure: DRAINAGE OF PLEURAL EFFUSION;  Surgeon: [name], MD;  Location: [place];  Service: Thoracic;  Laterality: Left;  . REVISON OF ARTERIOVENOUS FISTULA Left 10/10/2015   Procedure: REVISON OF LEFT RADIOCEPHALIC ARTERIOVENOUS FISTULA;  Surgeon: Angelia Mould, MD;  Location: [place];  Service: Vascular;  Laterality: Left;  . REVISON OF ARTERIOVENOUS FISTULA Left 02/07/2016   Procedure: REPAIR OF PSEUDO-ANEUREYSM OF LEFT ARM  ARTERIOVENOUS FISTULA;  Surgeon: Angelia Mould, MD;  Location: [place];  Service: Vascular;  Laterality: Left;  . REVISON OF ARTERIOVENOUS FISTULA Left 0/04/6760   Procedure: PLICATION OF LEFT ARM RADIOCEPHALIC ARTERIOVENOUS FISTULA PSEUDOANEURYSM;  Surgeon: Angelia Mould, MD;  Location: [place];  Service: Vascular;  Laterality: Left;  . REVISON OF ARTERIOVENOUS FISTULA Left 07/04/2018   Procedure: REVISION OF ARTERIOVENOUS RADIOCEPHALIC FISTULA;  Surgeon: Angelia Mould, MD;  Location: [place];  Service: Vascular;  Laterality: Left;  . RIGHT/LEFT HEART CATH AND CORONARY ANGIOGRAPHY N/A  02/17/2017   Procedure: Right/Left Heart Cath and Coronary Angiography;  Surgeon: Sherren Mocha, MD;  Location: [place] CV LAB;  Service: Cardiovascular;  Laterality: N/A;  . RIGHT/LEFT HEART CATH AND CORONARY ANGIOGRAPHY N/A 07/25/2018   Procedure: RIGHT/LEFT HEART CATH AND CORONARY ANGIOGRAPHY;  Surgeon: Leonie Man, MD;  Location: [place] CV LAB;  Service: Cardiovascular;  Laterality: N/A;  . TEE WITHOUT CARDIOVERSION N/A 09/11/2016   Procedure: TRANSESOPHAGEAL ECHOCARDIOGRAM (TEE);  Surgeon: Dorothy Spark, MD;  Location: [place];  Service: Cardiovascular;  Laterality: N/A;  . TEE WITHOUT CARDIOVERSION N/A 05/05/2018   Procedure: TRANSESOPHAGEAL ECHOCARDIOGRAM (TEE);  Surgeon: Adrian Prows, MD;  Location: [place];  Service: Cardiovascular;  Laterality: N/A;  Prefer after 3:30 PM  . TEE WITHOUT CARDIOVERSION N/A 07/28/2018   Procedure: TRANSESOPHAGEAL ECHOCARDIOGRAM (TEE);  Surgeon: [name], MD;  Location: [place];  Service: Open Heart Surgery;  Laterality: N/A;  . VIDEO ASSISTED THORACOSCOPY Left 11/04/2018   Procedure: VIDEO ASSISTED THORACOSCOPY;  Surgeon: [name], MD;  Location: [place];  Service: Thoracic;  Laterality: Left;  Marland Kitchen VIDEO ASSISTED THORACOSCOPY (VATS)/THOROCOTOMY Left 11/04/2018     Social History   reports that he quit smoking about 6 years ago. His smoking use included cigarettes. He quit after 10.00 years of use. He has never used smokeless tobacco. He reports that he does not drink alcohol or use drugs.   Family History   His family history includes Cancer in his father; Diabetes in his mother; Hypertension in his mother.   Allergies Allergies  Allergen Reactions  . Lisinopril Anaphylaxis and Shortness Of Breath    Throat swelling  . Penicillins Anaphylaxis and Other (See Comments)    Childhood allergy Has patient had a PCN reaction causing immediate rash, facial/tongue/throat swelling, SOB or lightheadedness with  hypotension: Yes Has patient had a PCN reaction causing severe rash involving mucus membranes or skin necrosis: Unk Has patient had a PCN reaction that required hospitalization: Unk Has patient had a PCN reaction occurring within the last 10 years:  No If all of the above answers are "NO", then may proceed with Cephalosporin use.   . Fentanyl Other (See Comments)    Lethargy, AMS  . Morphine And Related Other (See Comments)    "Not Himself"     Home Medications  Prior to Admission medications   Medication Sig Start Date End Date Taking? Authorizing Provider  acetaminophen (TYLENOL) 325 MG tablet Take 2 tablets (650 mg total) by mouth every 6 (six) hours as needed for fever, headache, mild pain or moderate pain. 08/17/18   Elgie Collard, PA-C  albuterol (PROVENTIL) (2.5 MG/3ML) 0.083% nebulizer solution Take 3 mLs (2.5 mg total) by nebulization every 6 (six) hours as needed for wheezing or shortness of breath. 09/05/18   Medina-Vargas, Monina C, NP  Amino Acids-Protein Hydrolys (FEEDING SUPPLEMENT, PRO-STAT SUGAR FREE 64,) LIQD Take 30 mLs by mouth daily.    [provider]  amLODipine (NORVASC) 10 MG tablet TAKE 1 TABLET BY MOUTH DAILY 05/12/19   Aldine Contes, MD  aspirin EC 81 MG tablet Take 81 mg by mouth daily.    [provider]  BIKTARVY 50-200-25 MG TABS tablet TAKE 1 TABLET BY MOUTH DAILY 12/27/18   Campbell Riches, MD  cloNIDine (CATAPRES - DOSED IN MG/24 HR) 0.3 mg/24hr patch Place 1 patch (0.3 mg total) onto the skin once a week. 04/18/19   Axel Filler, MD  diclofenac sodium (VOLTAREN) 1 % GEL Apply 2 g topically 4 (four) times daily as needed (Pain). 03/10/19   Neva Seat, MD  Epoetin Alfa (EPOGEN IJ) Inject as directed. THREE TIMES A WEEK AT DIALYSIS    [provider]  ferric gluconate 125 mg in sodium chloride 0.9 % 100 mL Inject 125 mg into the vein every Monday, Wednesday, and Friday with hemodialysis. 08/22/18   Gold, Patrick Jupiter E,  PA-C  hydrALAZINE (APRESOLINE) 100 MG tablet Take 1 tablet (100 mg total) by mouth 3 (three) times daily. 11/15/18   Velna Ochs, MD  iron sucrose (VENOFER) 20 MG/ML injection Inject 500 mg into the vein every 14 (fourteen) days.    [provider]  isosorbide mononitrate (ISMO,MONOKET) 20 MG tablet Take 1 tablet (20 mg total) by mouth 2 (two) times daily at 10 AM and 5 PM. 11/15/18   Velna Ochs, MD  Lacosamide (VIMPAT) 150 MG TABS Take 1 tablet (150 mg total) by mouth 2 (two) times daily. 04/06/19   Suzzanne Cloud, NP  lactulose (CHRONULAC) 10 GM/15ML solution Take 30 mLs (20 g total) by mouth daily as needed for moderate constipation. 11/25/18   Velna Ochs, MD  losartan (COZAAR) 25 MG tablet Take 25 mg by mouth daily.    [provider]  multivitamin (RENA-VIT) TABS tablet Take 1 tablet by mouth daily. 09/05/18   Medina-Vargas, Monina C, NP  NONFORMULARY OR COMPOUNDED ITEM [facility] Terbinafine 3% Fluconazole 2% Tea Tree Oil 5% Urea 10% Ibuprofen 2% in DMSO suspension #5mL JIR:CVELF to the affected nail(S) once (at bedtime) Antifungal 03/01/19   Marzetta Board, DPM  omeprazole (PRILOSEC) 20 MG capsule Take 1 capsule (20 mg total) by mouth daily. 11/15/18   Velna Ochs, MD  traMADol (ULTRAM) 50 MG tablet Take 1 tablet (50 mg total) by mouth every 12 (twelve) hours as needed. 02/16/19   [name], MD  traZODone (DESYREL) 50 MG tablet TAKE 1 TABLET(50 MG) BY MOUTH AT BEDTIME AS NEEDED FOR SLEEP 02/14/19   Campbell Riches, MD     Critical care  time: 34 minutes      Erick Colace ACNP-BC [place] Pager # [phone] OR # [phone] if no answer

## 2019-05-18 NOTE — Progress Notes (Signed)
EEG completed, results pending. 

## 2019-05-18 NOTE — ED Notes (Signed)
Ice Packs applied in preparation for [employer]

## 2019-05-18 NOTE — Progress Notes (Signed)
ANTICOAGULATION CONSULT NOTE - Initial Consult  Pharmacy Consult for heparin Indication: chest pain/ACS and Code Cool  Allergies  Allergen Reactions  . Lisinopril Anaphylaxis and Shortness Of Breath    Throat swelling  . Penicillins Anaphylaxis and Other (See Comments)    Childhood allergy Has patient had a PCN reaction causing immediate rash, facial/tongue/throat swelling, SOB or lightheadedness with hypotension: Yes Has patient had a PCN reaction causing severe rash involving mucus membranes or skin necrosis: Unk Has patient had a PCN reaction that required hospitalization: Unk Has patient had a PCN reaction occurring within the last 10 years: No If all of the above answers are "NO", then may proceed with Cephalosporin use.   . Fentanyl Other (See Comments)    Lethargy, AMS  . Morphine And Related Other (See Comments)    "Not Himself"    Patient Measurements: Height: 6' (182.9 cm) Weight: 191 lb 12.8 oz (87 kg) IBW/kg (Calculated) : 77.6 Heparin Dosing Weight: 87kg  Vital Signs: Temp: 97.4 F (36.3 C) (08/27 0721) Temp Source: Tympanic (08/27 0651) BP: 151/62 (08/27 0721) Pulse Rate: 89 (08/27 0651)  Labs: Recent Labs    04/22/2019 0647 05/09/2019 0648 05/05/2019 0649 04/30/2019 0658  HGB 8.7*  --  9.5* 8.8*  HCT 28.4*  --  28.0* 26.0*  PLT 265  --   --   --   CREATININE  --  14.20*  --   --     Estimated Creatinine Clearance: 5.7 mL/min (A) (by C-G formula based on SCr of 14.2 mg/dL (H)).   Medical History: Past Medical History:  Diagnosis Date  . Anemia   . Anxiety   . Aortic valve stenosis   . Arthritis   . Asthma    per pt hx  . Dyspnea   . End stage renal disease on home HD 07/10/2011   Started HD in September 2012 at [facility] with a tunneled HD catheter, now on home HD with NxtStage. Dialyzing through AVF L lower arm with buttonhole technique as of mid 2014. His brother does the HD treatments at home.  They are roommates for 23 years.  The brother works 3rd  shift and gets off about 8am and then puts Mr Davidian on HD in the morning after getting home. Most of the time he does HD about 4 times a week, for about 4 hours per treatment. Cause of ESRD was HTN according to patient. He says he let his health go and ending up with complications, and that he didn't like seeing doctors in those days.  He says he was diagnosed with severe HTN when he lived in [place] in his 36's.   . Hepatitis B carrier ([place])   . HIV infection ([place])   . Hypertension   . Hyperthyroidism    normal now  . Pneumonia several yrs ago  . Seizures ([place]) 06/02/2011   had a mild one in Sept. 2019  . Sleep apnea    to start Cpap soon  . Thrombocytopenia ([facility])     Medications:  Infusions:  . sodium chloride    . amiodarone 60 mg/hr (04/27/2019 0648)  . amiodarone    . fentaNYL infusion INTRAVENOUS    . heparin    . norepinephrine (LEVOPHED) Adult infusion    . propofol (DIPRIVAN) infusion      Assessment: 73 yom presented to the ED s/p CPR and now made a Code Cool. To start IV heparin for ACS. Baseline Hgb is low at 8.8 and platelets  are WNL.   Goal of Therapy:  Heparin level 0.3-0.7 units/ml Monitor platelets by anticoagulation protocol: Yes   Plan:  Heparin bolus 3000 units IV x 1 Heparin gtt 700 units/hr Check a 6 hr heparin level Daily heparin level and CBC  Jaskirat Schwieger, Rande Lawman 05/14/2019,7:29 AM

## 2019-05-18 NOTE — ED Triage Notes (Signed)
Patient's brother found him on the bed, unresponsive. Pt has hx of at-home HD dialysis. Per EMS CPR performed for 35 minutes, 5 epi IVP, 1 Calcium, 1 Bicarb, and 450mg  Amiodarone IVP given. Pt rhythm in route was primarily Vfib and EMS states that pt was shocked 8 times.

## 2019-05-18 NOTE — Progress Notes (Signed)
Pt is unavailable to his EEG at this time due to pt is going to have another procedure done first. Will attempt later when schedule permits

## 2019-05-18 NOTE — Progress Notes (Signed)
Chaplain responded to a request to come to the ED.  Patient was being assessed and treated.  Patient's brother arrived.  Chaplain notified staff family present.  Chaplain sat with patient's brother, [name].  MD explained what was going on and Chaplain offered support and empathetic/refletive listening.  Patient moved to Cathlab and Chaplain escorted [name] to the waiting area.  Chaplain will pass on to on coming Chaplain to follow up. [place], MDiv.    05/15/2019 0800  Clinical Encounter Type  Visited With Family;Health care provider  Visit Type ED;Critical Care  Referral From Nurse  Consult/Referral To Chaplain  Stress Factors  Family Stress Factors Health changes

## 2019-05-18 NOTE — Progress Notes (Signed)
Cardiology on call Dr. Marletta Lor was made aware of the patient being in AFIB with a heart rate of 40s while on Amiodarone drip. Verbal order received to stop Amidarone, get a EKG, and to notify Cardiology once heart rate returns to a normal heart rate.

## 2019-05-18 NOTE — Procedures (Signed)
Patient Name: Jeffrey [name]  MRN: [id number]  Epilepsy Attending: Lora Havens  Referring Physician/Provider: Dr Larae Grooms Date: 05/17/2019  Duration: 25.36 mins  Patient history: 63yo M with cardiac arrest, now on TTM. EEG to evaluate for seizures.  Level of alertness: sedated/comatose  AEDs during EEG study: propofol, versed  Technical aspects: This EEG study was done with scalp electrodes positioned according to the 10-20 International system of electrode placement. Electrical activity was acquired at a sampling rate of 500Hz  and reviewed with a high frequency filter of 70Hz  and a low frequency filter of 1Hz . EEG data were recorded continuously and digitally stored.   DESCRIPTION: EEG showed generalized background suppression. EEG was not reactive to stimulation. Hyperventilation and photic stimulation were not performed.  IMPRESSION: This study is suggestive of profound diffuse encephalopathy likely secondary to medication effects and anoxic/hypoxic brain injury. No seizures or epileptiform discharges were seen throughout the recording.  Genie Wenke [name]

## 2019-05-18 NOTE — ED Notes (Signed)
[facility] called to activate code stemi 

## 2019-05-18 NOTE — Consult Note (Addendum)
Primary Cardiologist: Sherren Mocha, MD PMD: Chief Complaint: Cardiac Arrest   HPI:  Jeffrey [name] is an 63 y.o. male with ESRD and HIV who was found unresponsive by his brother. EMS was called and performed ~53minutes of CPR with 5 rounds of Epi, 1 of calcium, 1 of bicarb, and 450 mg of amiodarone. Primary rhythm in route was V-fib and the patient was shocked ~8 times. With return of ROSC, EKG was obtained that illustrated anterior ST elevation. Patient was subsequently taken to the cath lab for further evaluation.   Past Medical History:  Diagnosis Date  . Anemia   . Anxiety   . Aortic valve stenosis   . Arthritis   . Asthma    per pt hx  . Dyspnea   . End stage renal disease on home HD 07/10/2011   Started HD in September 2012 at [facility] with a tunneled HD catheter, now on home HD with NxtStage. Dialyzing through AVF L lower arm with buttonhole technique as of mid 2014. His brother does the HD treatments at home.  They are roommates for 23 years.  The brother works 3rd shift and gets off about 8am and then puts Jeffrey [name] on HD in the morning after getting home. Most of the time he does HD about 4 times a week, for about 4 hours per treatment. Cause of ESRD was HTN according to patient. He says he let his health go and ending up with complications, and that he didn't like seeing doctors in those days.  He says he was diagnosed with severe HTN when he lived in [place] in his 53's.   . Hepatitis B carrier ([place])   . HIV infection ([place])   . Hypertension   . Hyperthyroidism    normal now  . Pneumonia several yrs ago  . Seizures ([place]) 06/02/2011   had a mild one in Sept. 2019  . Sleep apnea    to start Cpap soon  . Thrombocytopenia ([place])     Past Surgical History:  Procedure Laterality Date  . AORTIC VALVE REPLACEMENT N/A 07/28/2018   Procedure: AORTIC VALVE REPLACEMENT (AVR) using Inspiris Resilia  size 23 Aortic Valve.;  Surgeon: [name], MD;  Location: [place];   Service: Open Heart Surgery;  Laterality: N/A;  . AV FISTULA PLACEMENT  06/02/11   Left radiocephalic AVF  . BIOPSY  02/17/2018   Procedure: BIOPSY;  Surgeon: Milus Banister, MD;  Location: [facility];  Service: Endoscopy;;  . CHEST TUBE INSERTION Left 11/04/2018   Procedure: Chest Tube Insertion;  Surgeon: [name], MD;  Location: [place];  Service: Thoracic;  Laterality: Left;  . COLONOSCOPY    . COLONOSCOPY WITH PROPOFOL N/A 01/27/2018   Procedure: COLONOSCOPY WITH PROPOFOL;  Surgeon: [name], MD;  Location: [facility];  Service: Gastroenterology;  Laterality: N/A;  . CORONARY ANGIOGRAPHY N/A 05/04/2019   Procedure: CORONARY ANGIOGRAPHY;  Surgeon: Jettie Booze, MD;  Location: [place] [facility];  Service: Cardiovascular;  Laterality: N/A;  . CORONARY ANGIOPLASTY    . CORONARY/GRAFT ACUTE MI REVASCULARIZATION N/A 05/17/2019   Procedure: CORONARY/GRAFT ACUTE MI REVASCULARIZATION;  Surgeon: Jettie Booze, MD;  Location: [place] [facility];  Service: Cardiovascular;  Laterality: N/A;  . DECORTICATION Left 11/04/2018   Procedure: Left Lung DECORTICATION;  Surgeon: [name], MD;  Location: [place];  Service: Thoracic;  Laterality: Left;  . ERCP  03/21/2018   AT [place]  . ESOPHAGOGASTRODUODENOSCOPY N/A 02/17/2018  Procedure: ESOPHAGOGASTRODUODENOSCOPY (EGD);  Surgeon: Milus Banister, MD;  Location: [facility];  Service: Endoscopy;  Laterality: N/A;  . ESOPHAGOGASTRODUODENOSCOPY (EGD) WITH PROPOFOL N/A 01/27/2018   Procedure: ESOPHAGOGASTRODUODENOSCOPY (EGD) WITH PROPOFOL;  Surgeon: [name], MD;  Location: [facility] ENDOSCOPY;  Service: Gastroenterology;  Laterality: N/A;  . ESOPHAGOGASTRODUODENOSCOPY (EGD) WITH PROPOFOL N/A 04/03/2018   Procedure: ESOPHAGOGASTRODUODENOSCOPY (EGD) WITH PROPOFOL;  Surgeon: Clarene Essex, MD;  Location: [place];  Service: Endoscopy;  Laterality: N/A;  . EUS N/A 02/17/2018   Procedure: UPPER ENDOSCOPIC ULTRASOUND  (EUS) RADIAL;  Surgeon: Milus Banister, MD;  Location: [facility];  Service: Endoscopy;  Laterality: N/A;  . EYE SURGERY Left 05/2018   cataract surgery   . fistulaogram     x 2 last 2 years  . IR DIALY SHUNT INTRO NEEDLE/INTRACATH INITIAL W/IMG LEFT Left 04/20/2018  . IR FLUORO GUIDE CV LINE RIGHT  09/16/2017  . IR THORACENTESIS ASP PLEURAL SPACE W/IMG GUIDE  10/04/2018  . IR US GUIDE VASC ACCESS RIGHT  09/16/2017  . IRRIGATION AND DEBRIDEMENT KNEE Left 09/15/2017   Procedure: IRRIGATION AND DEBRIDEMENT KNEE; arthroscopic clean out;  Surgeon: [name], MD;  [facility]: [facility];  Service: Orthopedics;  Laterality: Left;  . OTHER SURGICAL HISTORY     removal temporary HD catheter   . PLEURAL EFFUSION DRAINAGE Left 11/04/2018   Procedure: DRAINAGE OF PLEURAL EFFUSION;  Surgeon: [name], MD;  Location: [place];  Service: Thoracic;  Laterality: Left;  . REVISON OF ARTERIOVENOUS FISTULA Left 10/10/2015   Procedure: REVISON OF LEFT RADIOCEPHALIC ARTERIOVENOUS FISTULA;  Surgeon: Angelia Mould, MD;  Location: [place];  Service: Vascular;  Laterality: Left;  . REVISON OF ARTERIOVENOUS FISTULA Left 02/07/2016   Procedure: REPAIR OF PSEUDO-ANEUREYSM OF LEFT ARM  ARTERIOVENOUS FISTULA;  Surgeon: Angelia Mould, MD;  Location: [place];  Service: Vascular;  Laterality: Left;  . REVISON OF ARTERIOVENOUS FISTULA Left 7/56/4332   Procedure: PLICATION OF LEFT ARM RADIOCEPHALIC ARTERIOVENOUS FISTULA PSEUDOANEURYSM;  Surgeon: Angelia Mould, MD;  Location: [place];  Service: Vascular;  Laterality: Left;  . REVISON OF ARTERIOVENOUS FISTULA Left 07/04/2018   Procedure: REVISION OF ARTERIOVENOUS RADIOCEPHALIC FISTULA;  Surgeon: Angelia Mould, MD;  Location: [place];  Service: Vascular;  Laterality: Left;  . RIGHT/LEFT HEART CATH AND CORONARY ANGIOGRAPHY N/A 02/17/2017   Procedure: Right/Left Heart Cath and Coronary Angiography;  Surgeon: Sherren Mocha, MD;  Location: [place] [facility];  Service: Cardiovascular;  Laterality: N/A;  . RIGHT/LEFT HEART CATH AND CORONARY ANGIOGRAPHY N/A 07/25/2018   Procedure: RIGHT/LEFT HEART CATH AND CORONARY ANGIOGRAPHY;  Surgeon: Leonie Man, MD;  Location: [place] [facility];  Service: Cardiovascular;  Laterality: N/A;  . TEE WITHOUT CARDIOVERSION N/A 09/11/2016   Procedure: TRANSESOPHAGEAL ECHOCARDIOGRAM (TEE);  Surgeon: Dorothy Spark, MD;  Location: [place];  Service: Cardiovascular;  Laterality: N/A;  . TEE WITHOUT CARDIOVERSION N/A 05/05/2018   Procedure: TRANSESOPHAGEAL ECHOCARDIOGRAM (TEE);  Surgeon: Adrian Prows, MD;  Location: [place];  Service: Cardiovascular;  Laterality: N/A;  Prefer after 3:30 PM  . TEE WITHOUT CARDIOVERSION N/A 07/28/2018   Procedure: TRANSESOPHAGEAL ECHOCARDIOGRAM (TEE);  Surgeon: [name], MD;  Location: [place];  Service: Open Heart Surgery;  Laterality: N/A;  . VIDEO ASSISTED THORACOSCOPY Left 11/04/2018   Procedure: VIDEO ASSISTED THORACOSCOPY;  Surgeon: [name], MD;  Location: [place];  Service: Thoracic;  Laterality: Left;  Marland Kitchen VIDEO ASSISTED THORACOSCOPY (VATS)/THOROCOTOMY Left 11/04/2018   Family History  Problem Relation Age  of Onset  . Hypertension Mother   . Diabetes Mother   . Cancer Father    Social History:  reports that he quit smoking about 6 years ago. His smoking use included cigarettes. He quit after 10.00 years of use. He has never used smokeless tobacco. He reports that he does not drink alcohol or use drugs.  Allergies:  Allergies  Allergen Reactions  . Lisinopril Anaphylaxis and Shortness Of Breath    Throat swelling  . Penicillins Anaphylaxis and Other (See Comments)    Childhood allergy Has patient had a PCN reaction causing immediate rash, facial/tongue/throat swelling, SOB or lightheadedness with hypotension: Yes Has patient had a PCN reaction causing severe rash involving mucus membranes or skin necrosis: Unk Has patient had a  PCN reaction that required hospitalization: Unk Has patient had a PCN reaction occurring within the last 10 years: No If all of the above answers are "NO", then may proceed with Cephalosporin use.   . Fentanyl Other (See Comments)    Lethargy, AMS  . Morphine And Related Other (See Comments)    "Not Himself"   Medications Prior to Admission  Medication Sig Dispense Refill  . acetaminophen (TYLENOL) 325 MG tablet Take 2 tablets (650 mg total) by mouth every 6 (six) hours as needed for fever, headache, mild pain or moderate pain.    Marland Kitchen albuterol (PROVENTIL) (2.5 MG/3ML) 0.083% nebulizer solution Take 3 mLs (2.5 mg total) by nebulization every 6 (six) hours as needed for wheezing or shortness of breath. 360 mL 0  . Amino Acids-Protein Hydrolys (FEEDING SUPPLEMENT, PRO-STAT SUGAR FREE 64,) LIQD Take 30 mLs by mouth daily.    Marland Kitchen amLODipine (NORVASC) 10 MG tablet TAKE 1 TABLET BY MOUTH DAILY 90 tablet 1  . aspirin EC 81 MG tablet Take 81 mg by mouth daily.    Marland Kitchen BIKTARVY 50-200-25 MG TABS tablet TAKE 1 TABLET BY MOUTH DAILY 30 tablet 5  . cloNIDine (CATAPRES - DOSED IN MG/24 HR) 0.3 mg/24hr patch Place 1 patch (0.3 mg total) onto the skin once a week. 14 patch 1  . diclofenac sodium (VOLTAREN) 1 % GEL Apply 2 g topically 4 (four) times daily as needed (Pain). 50 g 1  . Epoetin Alfa (EPOGEN IJ) Inject as directed. THREE TIMES A WEEK AT DIALYSIS    . ferric gluconate 125 mg in sodium chloride 0.9 % 100 mL Inject 125 mg into the vein every Monday, Wednesday, and Friday with hemodialysis.    . hydrALAZINE (APRESOLINE) 100 MG tablet Take 1 tablet (100 mg total) by mouth 3 (three) times daily. 90 tablet 1  . iron sucrose (VENOFER) 20 MG/ML injection Inject 500 mg into the vein every 14 (fourteen) days.    . isosorbide mononitrate (ISMO,MONOKET) 20 MG tablet Take 1 tablet (20 mg total) by mouth 2 (two) times daily at 10 AM and 5 PM. 180 tablet 1  . Lacosamide (VIMPAT) 150 MG TABS Take 1 tablet (150 mg total)  by mouth 2 (two) times daily. 60 tablet 5  . lactulose (CHRONULAC) 10 GM/15ML solution Take 30 mLs (20 g total) by mouth daily as needed for moderate constipation. 473 mL 1  . losartan (COZAAR) 25 MG tablet Take 25 mg by mouth daily.    . multivitamin (RENA-VIT) TABS tablet Take 1 tablet by mouth daily. 30 tablet 0  . NONFORMULARY OR COMPOUNDED ITEM [facility] Terbinafine 3% Fluconazole 2% Tea Tree Oil 5% Urea 10% Ibuprofen 2% in DMSO suspension #19mL WEX:HBZJI to the affected  nail(S) once (at bedtime) Antifungal 30 each 5  . omeprazole (PRILOSEC) 20 MG capsule Take 1 capsule (20 mg total) by mouth daily. 90 capsule 1  . traMADol (ULTRAM) 50 MG tablet Take 1 tablet (50 mg total) by mouth every 12 (twelve) hours as needed. 14 tablet 0  . traZODone (DESYREL) 50 MG tablet TAKE 1 TABLET(50 MG) BY MOUTH AT BEDTIME AS NEEDED FOR SLEEP 30 tablet 5   Results for orders placed or performed during the hospital encounter of 05/11/2019 (from the past 48 hour(s))  CBG monitoring, ED     Status: Abnormal   Collection Time: 05/01/2019  6:37 AM  Result Value Ref Range   Glucose-Capillary 288 (H) 70 - 99 mg/dL   Comment 1 Notify RN   Basic metabolic panel     Status: Abnormal   Collection Time: 05/04/2019  6:47 AM  Result Value Ref Range   Sodium 136 135 - 145 mmol/L   Potassium 3.7 3.5 - 5.1 mmol/L   Chloride 96 (L) 98 - 111 mmol/L   CO2 14 (L) 22 - 32 mmol/L   Glucose, Bld 283 (H) 70 - 99 mg/dL   BUN 83 (H) 8 - 23 mg/dL   Creatinine, Ser 14.39 (H) 0.61 - 1.24 mg/dL   Calcium 9.3 8.9 - 10.3 mg/dL   GFR calc non Af Amer 3 (L) >60 mL/min   GFR calc Af Amer 4 (L) >60 mL/min   Anion gap 26 (H) 5 - 15    Comment: Performed at [facility], [address] [place], [place] [address]  CBC     Status: Abnormal   Collection Time: 05/22/2019  6:47 AM  Result Value Ref Range   WBC 11.5 (H) 4.0 - 10.5 K/uL   RBC 2.89 (L) 4.22 - 5.81 MIL/uL   Hemoglobin 8.7 (L) 13.0 - 17.0 g/dL   HCT 28.4 (L) 39.0 -  52.0 %   MCV 98.3 80.0 - 100.0 fL   MCH 30.1 26.0 - 34.0 pg   MCHC 30.6 30.0 - 36.0 g/dL   RDW 15.9 (H) 11.5 - 15.5 %   Platelets 265 150 - 400 K/uL   nRBC 0.4 (H) 0.0 - 0.2 %    Comment: Performed at [place] [address], [place], [place] [address]  APTT     Status: Abnormal   Collection Time: 04/27/2019  6:47 AM  Result Value Ref Range   aPTT 39 (H) 24 - 36 seconds    Comment:        IF BASELINE aPTT IS ELEVATED, SUGGEST PATIENT RISK ASSESSMENT BE USED TO DETERMINE APPROPRIATE ANTICOAGULANT THERAPY. Performed at [facility], [place] [address], [place], [place] [address]   Protime-INR     Status: Abnormal   Collection Time: 05/07/2019  6:47 AM  Result Value Ref Range   Prothrombin Time 16.4 (H) 11.4 - 15.2 seconds   INR 1.3 (H) 0.8 - 1.2    Comment: (NOTE) INR goal varies based on device and disease states. Performed at [facility], [place] [address], [place], [place] [address]   Troponin I (High Sensitivity)     Status: Abnormal   Collection Time: 05/05/2019  6:47 AM  Result Value Ref Range   Troponin I (High Sensitivity) 840 (HH) <18 ng/L    Comment: CRITICAL RESULT CALLED TO, READ BACK BY AND VERIFIED WITH: K.MCATEE,RN 05/21/2019 0801 DAVISB (NOTE) Elevated high sensitivity troponin I (hsTnI) values and significant  changes across  serial measurements may suggest ACS but many other  chronic and acute conditions are known to elevate hsTnI results.  Refer to the Links section for chest pain algorithms and additional  guidance. Performed at [facility], [place] [address] [place], [place] [address]   Lactic acid, plasma     Status: Abnormal   Collection Time: 05/09/2019  6:48 AM  Result Value Ref Range   Lactic Acid, Venous 6.6 (HH) 0.5 - 1.9 mmol/L    Comment: CRITICAL RESULT CALLED TO, READ BACK BY AND VERIFIED WITH: K.MCATEE,RN 05/17/2019 0801 DAVISB Performed at [place] [address], [place],  [address]   I-Stat Creatinine,  ED (not at [facility])     Status: Abnormal   Collection Time: 05/01/2019  6:48 AM  Result Value Ref Range   Creatinine, Ser 14.20 (H) 0.61 - 1.24 mg/dL  POCT I-Stat EG7     Status: Abnormal   Collection Time: 04/23/2019  6:49 AM  Result Value Ref Range   pH, Ven 7.261 7.250 - 7.430   pCO2, Ven 33.3 (L) 44.0 - 60.0 mmHg   pO2, Ven 59.0 (H) 32.0 - 45.0 mmHg   Bicarbonate 15.0 (L) 20.0 - 28.0 mmol/L   TCO2 16 (L) 22 - 32 mmol/L   O2 Saturation 87.0 %   Acid-base deficit 11.0 (H) 0.0 - 2.0 mmol/L   Sodium 136 135 - 145 mmol/L   Potassium 3.6 3.5 - 5.1 mmol/L   Calcium, Ion 1.13 (L) 1.15 - 1.40 mmol/L   HCT 28.0 (L) 39.0 - 52.0 %   Hemoglobin 9.5 (L) 13.0 - 17.0 g/dL   Patient temperature 37.0 C    Sample type VENOUS   SARS Coronavirus 2 [facility] order, Performed in [facility] hospital lab) Nasopharyngeal Nasopharyngeal Swab     Status: None   Collection Time: 05/09/2019  6:55 AM   Specimen: Nasopharyngeal Swab  Result Value Ref Range   SARS Coronavirus 2 NEGATIVE NEGATIVE    Comment: (NOTE) If result is NEGATIVE SARS-CoV-2 target nucleic acids are NOT DETECTED. The SARS-CoV-2 RNA is generally detectable in upper and lower  respiratory specimens during the acute phase of infection. The lowest  concentration of SARS-CoV-2 viral copies this assay can detect is 250  copies / mL. A negative result does not preclude SARS-CoV-2 infection  and should not be used as the sole basis for treatment or other  patient management decisions.  A negative result may occur with  improper specimen collection / handling, submission of specimen other  than nasopharyngeal swab, presence of viral mutation(s) within the  areas targeted by this assay, and inadequate number of viral copies  (<250 copies / mL). A negative result must be combined with clinical  observations, patient history, and epidemiological information. If result is POSITIVE SARS-CoV-2 target nucleic acids are DETECTED. The SARS-CoV-2 RNA is  generally detectable in upper and lower  respiratory specimens dur ing the acute phase of infection.  Positive  results are indicative of active infection with SARS-CoV-2.  Clinical  correlation with patient history and other diagnostic information is  necessary to determine patient infection status.  Positive results do  not rule out bacterial infection or co-infection with other viruses. If result is PRESUMPTIVE POSTIVE SARS-CoV-2 nucleic acids MAY BE PRESENT.   A presumptive positive result was obtained on the submitted specimen  and confirmed on repeat testing.  While 2019 novel coronavirus  (SARS-CoV-2) nucleic acids may be present in the submitted sample  additional confirmatory  testing may be necessary for epidemiological  and / or clinical management purposes  to differentiate between  SARS-CoV-2 and other Sarbecovirus currently known to infect humans.  If clinically indicated additional testing with an alternate test  methodology [phone]) is advised. The SARS-CoV-2 RNA is generally  detectable in upper and lower respiratory sp ecimens during the acute  phase of infection. The expected result is Negative. Fact Sheet for Patients:  StrictlyIdeas.no Fact Sheet for Healthcare Providers: BankingDealers.co.za This test is not yet approved or cleared by the [place] FDA and has been authorized for detection and/or diagnosis of SARS-CoV-2 by FDA under an Emergency Use Authorization (EUA).  This EUA will remain in effect (meaning this test can be used) for the duration of the COVID-19 declaration under Section 564(b)(1) of the Act, 21 U.S.C. section 360bbb-3(b)(1), unless the authorization is terminated or revoked sooner. Performed at [facility], [place] [address], [place], [place] [address]   I-STAT 7, (LYTES, BLD GAS, ICA, H+H)     Status: Abnormal   Collection Time: 05/15/2019  6:58 AM  Result Value Ref Range   pH,  Arterial 7.185 (LL) 7.350 - 7.450   pCO2 arterial 41.1 32.0 - 48.0 mmHg   pO2, Arterial 55.0 (L) 83.0 - 108.0 mmHg   Bicarbonate 15.5 (L) 20.0 - 28.0 mmol/L   TCO2 17 (L) 22 - 32 mmol/L   O2 Saturation 80.0 %   Acid-base deficit 12.0 (H) 0.0 - 2.0 mmol/L   Sodium 138 135 - 145 mmol/L   Potassium 3.2 (L) 3.5 - 5.1 mmol/L   Calcium, Ion 1.20 1.15 - 1.40 mmol/L   HCT 26.0 (L) 39.0 - 52.0 %   Hemoglobin 8.8 (L) 13.0 - 17.0 g/dL   Patient temperature 98.7 F    Collection site RADIAL, ALLEN'S TEST ACCEPTABLE    Drawn by RT    Sample type ARTERIAL    Comment NOTIFIED PHYSICIAN   Basic metabolic panel     Status: Abnormal   Collection Time: 05/07/2019  7:13 AM  Result Value Ref Range   Sodium 138 135 - 145 mmol/L   Potassium 3.2 (L) 3.5 - 5.1 mmol/L   Chloride 98 98 - 111 mmol/L   CO2 17 (L) 22 - 32 mmol/L   Glucose, Bld 259 (H) 70 - 99 mg/dL   BUN 83 (H) 8 - 23 mg/dL   Creatinine, Ser 13.12 (H) 0.61 - 1.24 mg/dL   Calcium 8.3 (L) 8.9 - 10.3 mg/dL   GFR calc non Af Amer 3 (L) >60 mL/min   GFR calc Af Amer 4 (L) >60 mL/min   Anion gap 23 (H) 5 - 15    Comment: Performed at [place] [address], [place], [place] [address]  Protime-INR now and repeat in 8 hours     Status: Abnormal   Collection Time: 05/10/2019  7:13 AM  Result Value Ref Range   Prothrombin Time 18.7 (H) 11.4 - 15.2 seconds   INR 1.6 (H) 0.8 - 1.2    Comment: (NOTE) INR goal varies based on device and disease states. Performed at [facility], [place] [address], [place], [place] [address]   APTT now and repeat in 8 hours     Status: Abnormal   Collection Time: 05/19/2019  7:13 AM  Result Value Ref Range   aPTT >200 (HH) 24 - 36 seconds    Comment:        IF BASELINE aPTT IS ELEVATED, SUGGEST PATIENT  RISK ASSESSMENT BE USED TO DETERMINE APPROPRIATE ANTICOAGULANT THERAPY. RESULT REPEATED AND VERIFIED CRITICAL RESULT CALLED TO, READ BACK BY AND VERIFIED WITH: [facility] 1136 05/15/2019 CLARK,S  Performed at [facility] [address] [place], [place] [address]   CBC     Status: Abnormal   Collection Time: 05/11/2019 10:07 AM  Result Value Ref Range   WBC 13.3 (H) 4.0 - 10.5 K/uL   RBC 2.92 (L) 4.22 - 5.81 MIL/uL   Hemoglobin 8.8 (L) 13.0 - 17.0 g/dL   HCT 27.5 (L) 39.0 - 52.0 %   MCV 94.2 80.0 - 100.0 fL   MCH 30.1 26.0 - 34.0 pg   MCHC 32.0 30.0 - 36.0 g/dL   RDW 15.8 (H) 11.5 - 15.5 %   Platelets 200 150 - 400 K/uL   nRBC 0.0 0.0 - 0.2 %    Comment: Performed at [facility], [place] [address] [place], [place] [address]  I-STAT, [place] 8     Status: Abnormal   Collection Time: 05/16/2019 10:20 AM  Result Value Ref Range   Sodium 138 135 - 145 mmol/L   Potassium 3.2 (L) 3.5 - 5.1 mmol/L   Chloride 100 98 - 111 mmol/L   BUN 88 (H) 8 - 23 mg/dL   Creatinine, Ser 12.90 (H) 0.61 - 1.24 mg/dL   Glucose, Bld 249 (H) 70 - 99 mg/dL   Calcium, Ion 1.08 (L) 1.15 - 1.40 mmol/L   TCO2 23 22 - 32 mmol/L   Hemoglobin 9.9 (L) 13.0 - 17.0 g/dL   HCT 29.0 (L) 39.0 - 52.0 %  POCT Activated clotting time     Status: None   Collection Time: 05/05/2019 11:33 AM  Result Value Ref Range   Activated Clotting Time 202 seconds   Dg Chest Portable 1 View  Result Date: 05/20/2019 CLINICAL DATA:  Intubation.  Status post CPR. EXAM: PORTABLE CHEST 1 VIEW COMPARISON:  01/31/2019. FINDINGS: Endotracheal tube noted with tip 3 cm above the carina. NG tube noted with tip below hemidiaphragm. Prior CABG and cardiac valve replacement. Cardiomegaly with mild bilateral interstitial prominence. Mild CHF cannot be excluded. No prominent pleural effusion. No pneumothorax. No acute bony abnormality. IMPRESSION: 1. Endotracheal tube noted with tip 3 cm above the carina. NG tube noted with tip below left hemidiaphragm. 2. Prior CABG and cardiac valve replacement. Cardiomegaly with mild bilateral interstitial prominence. Mild CHF cannot be excluded. Electronically Signed   By: Marcello Moores  Register   On:  05/04/2019 07:32   ROS: Unable to obtain due to mental status   OBJECTIVE:   Vitals:   Vitals:   05/07/2019 0937 05/17/2019 0945 05/15/2019 1000 05/12/2019 1015  BP:      Pulse:  91 85   Resp: 20 19 (!) 22 14  Temp:      TempSrc:  Esophageal Esophageal   SpO2:  100% 100%   Weight:      Height:       I&O's:  No intake or output data in the 24 hours ending 04/26/2019 1209  PHYSICAL EXAM General: Well developed, well nourished, in no acute distress Head:   Normal cephalic and atramatic  Lungs:  Clear bilaterally to auscultation. Heart: HRRR S1 S2  No JVD.   Abdomen: abdomen soft and non-tender Msk:  Back normal,  Normal strength and tone for age. Extremities:  No edema.   Neuro: Alert and oriented. Psych:  Normal affect, responds appropriately  LABS: Basic Metabolic Panel: Recent Labs  04/30/2019 0647  04/28/2019 0713 04/28/2019 1020  NA 136   < > 138 138  K 3.7   < > 3.2* 3.2*  CL 96*  --  98 100  CO2 14*  --  17*  --   GLUCOSE 283*  --  259* 249*  BUN 83*  --  83* 88*  CREATININE 14.39*   < > 13.12* 12.90*  CALCIUM 9.3  --  8.3*  --    < > = values in this interval not displayed.   Liver Function Tests: No results for input(s): AST, ALT, ALKPHOS, BILITOT, PROT, ALBUMIN in the last 72 hours. No results for input(s): LIPASE, AMYLASE in the last 72 hours. CBC: Recent Labs    05/14/2019 0647  04/30/2019 1007 05/13/2019 1020  WBC 11.5*  --  13.3*  --   HGB 8.7*   < > 8.8* 9.9*  HCT 28.4*   < > 27.5* 29.0*  MCV 98.3  --  94.2  --   PLT 265  --  200  --    < > = values in this interval not displayed.   Cardiac Enzymes: No results for input(s): CKTOTAL, CKMB, CKMBINDEX, TROPONINI in the last 72 hours. BNP: Invalid input(s): POCBNP D-Dimer: No results for input(s): DDIMER in the last 72 hours. Hemoglobin A1C: No results for input(s): HGBA1C in the last 72 hours. Fasting Lipid Panel: No results for input(s): CHOL, HDL, LDLCALC, TRIG, CHOLHDL, LDLDIRECT in the last 72 hours.  Thyroid Function Tests: No results for input(s): TSH, T4TOTAL, T3FREE, THYROIDAB in the last 72 hours.  Invalid input(s): FREET3 Anemia Panel: No results for input(s): VITAMINB12, FOLATE, FERRITIN, TIBC, IRON, RETICCTPCT in the last 72 hours. Coag Panel:   Lab Results  Component Value Date   INR 1.6 (H) 05/17/2019   INR 1.3 (H) 05/01/2019   INR 1.07 11/02/2018   Assessment/Plan  TRAY KLAYMAN is an 63 y.o. male with ESRD and HIV who was found unresponsive by his brother. EMS was called and performed ~29minutes of CPR with 5 rounds of Epi, 1 of calcium, 1 of bicarb, and 450 mg of amiodarone. Primary rhythm in route was V-fib and the patient was shocked ~8 times. With return of ROSC, EKG was obtained that illustrated anterior ST elevation. Patient was subsequently taken to the cath lab for further evaluation.   V-fib arrest  CAD mid LAD occulusion s/p Synergy DES and 80% distal LAD lesion s/p balloon angio - LHC with 100% mid LAD occulusion s/p Synergy DES and 80% distal LAD lesion s/p balloon angio  - Levo stopped as pressures stabilized  - Transferred to the ICU for TTM  - Continue Amiodarone  - Continue ASA and ticagrelor per NG tube - Obtain echocardiogram   Case discussed with Dr. Irish Lack. Further recommendation pending neurological recovery.   Rest per [facility] 05/14/2019, 12:09 PM  [facility] PGY3   I have examined the patient and reviewed assessment and plan and discussed with patient.  Agree with above as stated.  I personally reviewed the ECG and discussed the case with the ER MD.  Several negative prognostic factors given unwitnessed arrest and prolonged CPR time.   GEN: Intubated HEENT: normal  Neck: no JVD, carotid bruits, or masses Cardiac: RRR; 2/6 systolic murmurs, no rubs, or gallops,  Respiratory:  Coarse breath sounds to auscultation bilaterally,  GI: soft, nontender, nondistended,  MS: no deformity or atrophy  Skin: warm and dry, no rash Neuro:   Intubated, sedated  Psych: intubated, sedated  Given his younger age, we decided on cath.  Aortic Valve appeared to be functioning well by exam.  Cath results as noted above.  BP stable during the procedure.  Will continue DAPT.  Further plans will depend on his neuro recovery.   Critical care time, excluding procedure time 35 minutes.   Larae Grooms

## 2019-05-18 NOTE — ED Provider Notes (Signed)
[facility] CATH LAB Provider Note  CSN: [id number] Arrival date & time: 05/12/2019 1027  Chief Complaint(s) Post CPR  HPI ILIAN [name] is a 63 y.o. male with extensive past medical history listed below who presents to the emergency department by EMS after cardiac arrest.  On arrival EMS, they noted patient in V. Fib.  ACLS was initiated.  Patient received a defibrillator shocks, 5 rounds of epi, 1 g of calcium, 1 amp of bicarb, 450 mg of amiodarone.  He had King airway inserted.  Required pacing.  Remainder of history, ROS, and physical exam limited due to patient's condition (unresponsive). Additional information was obtained from EMS.   Level V Caveat.    HPI  Past Medical History Past Medical History:  Diagnosis Date  . Anemia   . Anxiety   . Aortic valve stenosis   . Arthritis   . Asthma    per pt hx  . Dyspnea   . End stage renal disease on home HD 07/10/2011   Started HD in September 2012 at [facility] with a tunneled HD catheter, now on home HD with NxtStage. Dialyzing through AVF L lower arm with buttonhole technique as of mid 2014. His brother does the HD treatments at home.  They are roommates for 23 years.  The brother works 3rd shift and gets off about 8am and then puts Jeffrey [name] on HD in the morning after getting home. Most of the time he does HD about 4 times a week, for about 4 hours per treatment. Cause of ESRD was HTN according to patient. He says he let his health go and ending up with complications, and that he didn't like seeing doctors in those days.  He says he was diagnosed with severe HTN when he lived in [place] in his 74's.   . Hepatitis B carrier ([place])   . HIV infection ([place])   . Hypertension   . Hyperthyroidism    normal now  . Pneumonia several yrs ago  . Seizures ([place]) 06/02/2011   had a mild one in Sept. 2019  . Sleep apnea    to start Cpap soon  . Thrombocytopenia [facility])    Patient Active Problem List   Diagnosis Date  Noted  . Cardiac arrest ([place]) 04/30/2019  . Arthritis of left shoulder region 03/16/2019  . Pleural effusion on left 11/11/2018  . S/P Left VATS with Drainage of Pleural Effusion, Decortication of Visceral/Parietal Pleura 11/04/2018  . Physical deconditioning 09/16/2018  . S/P aortic valve replacement with bioprosthetic valve 07/28/2018  . Severe aortic insufficiency 07/28/2018  . Cerebral thrombosis with cerebral infarction 07/13/2018  . Seizure disorder ([place]) 07/10/2018  . Status epilepticus ([place]) 07/10/2018  . Altered mental status 07/09/2018  . Hypertensive urgency 07/04/2018  . OSA (obstructive sleep apnea) 05/31/2018  . Onychomycosis of right great toe 05/09/2018  . 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 Left shoulder pain   . 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 Coagulase negative Staphylococcus bacteremia   . Hypervolemia   . ESRD on hemodialysis ([place])   . Asymptomatic HIV infection ([place])   . CAD (coronary artery disease) 04/19/2018  . Dyspnea 04/10/2018  . Loud snoring 04/08/2018  . Upper GI bleed 04/08/2018  . NSTEMI (non-ST elevated myocardial infarction) ([place]) 03/15/2018  . PRES (posterior reversible encephalopathy syndrome) 03/14/2018  . HTN (hypertension), malignant 03/14/2018  . Malnutrition of moderate degree 02/23/2018  . Seizure ([place]) 02/22/2018  . Mass of duodenum   .  Antibiotic long-term use   . Septic arthritis ([place]) 09/15/2017  . Encounter for incision and drainage procedure 09/15/2017  . Effusion of left knee 09/08/2017  . Intertrigo 07/09/2017  . AC joint arthropathy 06/16/2017  . Pseudoaneurysm of arteriovenous dialysis fistula ([place]) 12/23/2016  . Eczema 12/10/2016  . Bilateral low back pain without sciatica   . Aortic valve insufficiency   . Fall 09/08/2016  . Knee abrasion, right, initial encounter 09/08/2016  . Lumbosacral spondylosis without myelopathy 08/03/2016  . Pulmonary nodules 08/03/2016  . Essential  hypertension   . Anemia of chronic disease 04/08/2016  . Secondary hyperparathyroidism ([place]) 02/09/2014  . Asthma 02/09/2014  . Nuclear sclerotic cataract of both eyes 12/12/2013  . Pruritus 10/26/2013  . Myopia with presbyopia of both eyes 10/26/2013  . COPD (chronic obstructive pulmonary disease) ([place]) 06/27/2013  . Thrombocytopenia ([place]) 01/09/2013  . HBV (hepatitis B virus) infection 01/09/2013  . Anxiety 07/22/2011  . HIV disease ([place]) 06/17/2011   Home Medication(s) Prior to Admission medications   Medication Sig Start Date End Date Taking? Authorizing Provider  acetaminophen (TYLENOL) 325 MG tablet Take 2 tablets (650 mg total) by mouth every 6 (six) hours as needed for fever, headache, mild pain or moderate pain. 08/17/18   Elgie Collard, PA-C  albuterol (PROVENTIL) (2.5 MG/3ML) 0.083% nebulizer solution Take 3 mLs (2.5 mg total) by nebulization every 6 (six) hours as needed for wheezing or shortness of breath. 09/05/18   Medina-Vargas, Monina C, NP  Amino Acids-Protein Hydrolys (FEEDING SUPPLEMENT, PRO-STAT SUGAR FREE 64,) LIQD Take 30 mLs by mouth daily.    [provider]  amLODipine (NORVASC) 10 MG tablet TAKE 1 TABLET BY MOUTH DAILY 05/12/19   Aldine Contes, MD  aspirin EC 81 MG tablet Take 81 mg by mouth daily.    [provider]  BIKTARVY 50-200-25 MG TABS tablet TAKE 1 TABLET BY MOUTH DAILY 12/27/18   Campbell Riches, MD  cloNIDine (CATAPRES - DOSED IN MG/24 HR) 0.3 mg/24hr patch Place 1 patch (0.3 mg total) onto the skin once a week. 04/18/19   Axel Filler, MD  diclofenac sodium (VOLTAREN) 1 % GEL Apply 2 g topically 4 (four) times daily as needed (Pain). 03/10/19   Neva Seat, MD  Epoetin Alfa (EPOGEN IJ) Inject as directed. THREE TIMES A WEEK AT DIALYSIS    [provider]  ferric gluconate 125 mg in sodium chloride 0.9 % 100 mL Inject 125 mg into the vein every Monday, Wednesday, and Friday with hemodialysis. 08/22/18   Gold,  Patrick Jupiter E, PA-C  hydrALAZINE (APRESOLINE) 100 MG tablet Take 1 tablet (100 mg total) by mouth 3 (three) times daily. 11/15/18   Velna Ochs, MD  iron sucrose (VENOFER) 20 MG/ML injection Inject 500 mg into the vein every 14 (fourteen) days.    [provider]  isosorbide mononitrate (ISMO,MONOKET) 20 MG tablet Take 1 tablet (20 mg total) by mouth 2 (two) times daily at 10 AM and 5 PM. 11/15/18   Velna Ochs, MD  Lacosamide (VIMPAT) 150 MG TABS Take 1 tablet (150 mg total) by mouth 2 (two) times daily. 04/06/19   Suzzanne Cloud, NP  lactulose (CHRONULAC) 10 GM/15ML solution Take 30 mLs (20 g total) by mouth daily as needed for moderate constipation. 11/25/18   Velna Ochs, MD  losartan (COZAAR) 25 MG tablet Take 25 mg by mouth daily.    [provider]  multivitamin (RENA-VIT) TABS tablet Take 1 tablet by mouth daily. 09/05/18  Medina-Vargas, Monina C, NP  NONFORMULARY OR COMPOUNDED ITEM [facility] Terbinafine 3% Fluconazole 2% Tea Tree Oil 5% Urea 10% Ibuprofen 2% in DMSO suspension #32mL JJ[facility]:ERDEY to the affected nail(S) once (at bedtime) Antifungal 03/01/19   Marzetta Board, DPM  omeprazole (PRILOSEC) 20 MG capsule Take 1 capsule (20 mg total) by mouth daily. 11/15/18   Velna Ochs, MD  traMADol (ULTRAM) 50 MG tablet Take 1 tablet (50 mg total) by mouth every 12 (twelve) hours as needed. 02/16/19   [name], MD  traZODone (DESYREL) 50 MG tablet TAKE 1 TABLET(50 MG) BY MOUTH AT BEDTIME AS NEEDED FOR SLEEP 02/14/19   Campbell Riches, MD                                                                                                                                    Past Surgical History Past Surgical History:  Procedure Laterality Date  . AORTIC VALVE REPLACEMENT N/A 07/28/2018   Procedure: AORTIC VALVE REPLACEMENT (AVR) using Inspiris Resilia  size 23 Aortic Valve.;  Surgeon: [name], MD;  Location: [facility];  Service: Open  Heart Surgery;  Laterality: N/A;  . AV FISTULA PLACEMENT  06/02/11   Left radiocephalic AVF  . BIOPSY  02/17/2018   Procedure: BIOPSY;  Surgeon: Milus Banister, MD;  Location: [facility];  Service: Endoscopy;;  . CHEST TUBE INSERTION Left 11/04/2018   Procedure: Chest Tube Insertion;  Surgeon: [name], MD;  Location: [facility];  Service: Thoracic;  Laterality: Left;  . COLONOSCOPY    . COLONOSCOPY WITH PROPOFOL N/A 01/27/2018   Procedure: COLONOSCOPY WITH PROPOFOL;  Surgeon: [name], MD;  Location[facility] ENDOSCOPY;  Service: Gastroenterology;  Laterality: N/A;  . CORONARY ANGIOPLASTY    . DECORTICATION Left 11/04/2018   Procedure: Left Lung DECORTICATION;  Surgeon: [name], MD;  Location: [facility];  Service: Thoracic;  Laterality: Left;  . ERCP  03/21/2018   AT [place]  . ESOPHAGOGASTRODUODENOSCOPY N/A 02/17/2018   Procedure: ESOPHAGOGASTRODUODENOSCOPY (EGD);  Surgeon: Milus Banister, MD;  Location: [facility];  Service: Endoscopy;  Laterality: N/A;  . ESOPHAGOGASTRODUODENOSCOPY (EGD) WITH PROPOFOL N/A 01/27/2018   Procedure: ESOPHAGOGASTRODUODENOSCOPY (EGD) WITH PROPOFOL;  Surgeon: [name], MD;  Location: [facility] ENDOSCOPY;  Service: Gastroenterology;  Laterality: N/A;  . ESOPHAGOGASTRODUODENOSCOPY (EGD) WITH PROPOFOL N/A 04/03/2018   Procedure: ESOPHAGOGASTRODUODENOSCOPY (EGD) WITH PROPOFOL;  Surgeon: Clarene Essex, MD;  Location: [place];  Service: Endoscopy;  Laterality: N/A;  . EUS N/A 02/17/2018   Procedure: UPPER ENDOSCOPIC ULTRASOUND (EUS) RADIAL;  Surgeon: Milus Banister, MD;  Location: [facility] ENDOSCOPY;  Service: Endoscopy;  Laterality: N/A;  . EYE SURGERY Left 05/2018   cataract surgery   . fistulaogram     x 2 last 2 years  . IR DIALY SHUNT INTRO NEEDLE/INTRACATH INITIAL W/IMG LEFT Left 04/20/2018  . IR FLUORO GUIDE CV LINE RIGHT  09/16/2017  . IR  THORACENTESIS ASP PLEURAL SPACE W/IMG GUIDE  10/04/2018  . IR US GUIDE VASC ACCESS RIGHT  09/16/2017   . IRRIGATION AND DEBRIDEMENT KNEE Left 09/15/2017   Procedure: IRRIGATION AND DEBRIDEMENT KNEE; arthroscopic clean out;  Surgeon: [name], MD;  Location: [facility] ORS;  Service: Orthopedics;  Laterality: Left;  . OTHER SURGICAL HISTORY     removal temporary HD catheter   . PLEURAL EFFUSION DRAINAGE Left 11/04/2018   Procedure: DRAINAGE OF PLEURAL EFFUSION;  Surgeon: [name], MD;  Location: [place];  Service: Thoracic;  Laterality: Left;  . REVISON OF ARTERIOVENOUS FISTULA Left 10/10/2015   Procedure: REVISON OF LEFT RADIOCEPHALIC ARTERIOVENOUS FISTULA;  Surgeon: Angelia Mould, MD;  Location: [facility];  Service: Vascular;  Laterality: Left;  . REVISON OF ARTERIOVENOUS FISTULA Left 02/07/2016   Procedure: REPAIR OF PSEUDO-ANEUREYSM OF LEFT ARM  ARTERIOVENOUS FISTULA;  Surgeon: Angelia Mould, MD;  Location: [place];  Service: Vascular;  Laterality: Left;  . REVISON OF ARTERIOVENOUS FISTULA Left 12/12/4008   Procedure: PLICATION OF LEFT ARM RADIOCEPHALIC ARTERIOVENOUS FISTULA PSEUDOANEURYSM;  Surgeon: Angelia Mould, MD;  Location: [facility];  Service: Vascular;  Laterality: Left;  . REVISON OF ARTERIOVENOUS FISTULA Left 07/04/2018   Procedure: REVISION OF ARTERIOVENOUS RADIOCEPHALIC FISTULA;  Surgeon: Angelia Mould, MD;  Location: [place];  Service: Vascular;  Laterality: Left;  . RIGHT/LEFT HEART CATH AND CORONARY ANGIOGRAPHY N/A 02/17/2017   Procedure: Right/Left Heart Cath and Coronary Angiography;  Surgeon: Sherren Mocha, MD;  Location: [place] CV LAB;  Service: Cardiovascular;  Laterality: N/A;  . RIGHT/LEFT HEART CATH AND CORONARY ANGIOGRAPHY N/A 07/25/2018   Procedure: RIGHT/LEFT HEART CATH AND CORONARY ANGIOGRAPHY;  Surgeon: Leonie Man, MD;  Location: [facility] CV LAB;  Service: Cardiovascular;  Laterality: N/A;  . TEE WITHOUT CARDIOVERSION N/A 09/11/2016   Procedure: TRANSESOPHAGEAL ECHOCARDIOGRAM (TEE);  Surgeon: Dorothy Spark, MD;  Location:  [facility];  Service: Cardiovascular;  Laterality: N/A;  . TEE WITHOUT CARDIOVERSION N/A 05/05/2018   Procedure: TRANSESOPHAGEAL ECHOCARDIOGRAM (TEE);  Surgeon: Adrian Prows, MD;  Location: [place];  Service: Cardiovascular;  Laterality: N/A;  Prefer after 3:30 PM  . TEE WITHOUT CARDIOVERSION N/A 07/28/2018   Procedure: TRANSESOPHAGEAL ECHOCARDIOGRAM (TEE);  Surgeon: [name], MD;  Location: [facility];  Service: Open Heart Surgery;  Laterality: N/A;  . VIDEO ASSISTED THORACOSCOPY Left 11/04/2018   Procedure: VIDEO ASSISTED THORACOSCOPY;  Surgeon: [name], MD;  Location: [place];  Service: Thoracic;  Laterality: Left;  Marland Kitchen VIDEO ASSISTED THORACOSCOPY (VATS)/THOROCOTOMY Left 11/04/2018   Family History Family History  Problem Relation Age of Onset  . Hypertension Mother   . Diabetes Mother   . Cancer Father     Social History Social History   Tobacco Use  . Smoking status: Former Smoker    Years: 10.00    Types: Cigarettes    Quit date: 08/07/2012    Years since quitting: 6.7  . Smokeless tobacco: Never Used  . Tobacco comment: casual smoking for 10 years  Substance Use Topics  . Alcohol use: No    Alcohol/week: 0.0 standard drinks    Comment: quit 2004  . Drug use: No    Comment: uds (+) cocaine in 08/2011 but pt states was taking sudafed at the time   Allergies Lisinopril, Penicillins, Fentanyl, and Morphine and related  Review of Systems Review of Systems  Unable to perform ROS: Patient unresponsive    Physical Exam Vital Signs  I have reviewed the triage  vital signs BP (!) 151/62   Pulse 89   Temp (!) 97.4 F (36.3 C)   Resp 18   Ht 6' (1.829 m)   Wt 87 kg   SpO2 100%   BMI 26.01 kg/m   Physical Exam Vitals signs reviewed.  Constitutional:      General: He is not in acute distress.    Appearance: He is well-developed. He is not diaphoretic.  HENT:     Head: Normocephalic and atraumatic.     Nose: Nose normal.  Eyes:     General:  No scleral icterus.       Right eye: No discharge.        Left eye: No discharge.     Conjunctiva/sclera: Conjunctivae normal.     Pupils: Pupils are equal, round, and reactive to light.  Neck:     Musculoskeletal: Normal range of motion and neck supple.  Cardiovascular:     Rate and Rhythm: Bradycardia present. Rhythm regularly irregular.  Pulmonary:     Effort: Accessory muscle usage and respiratory distress present.     Breath sounds: No stridor. Examination of the right-lower field reveals rales. Examination of the left-lower field reveals rales. Rales present.  Chest:    Abdominal:     General: There is no distension.     Palpations: Abdomen is soft.     Tenderness: There is no abdominal tenderness.  Musculoskeletal:        General: No tenderness.       Arms:  Skin:    General: Skin is warm and dry.     Findings: No erythema or rash.  Neurological:     Mental Status: He is unresponsive.     ED Results and Treatments Labs (all labs ordered are listed, but only abnormal results are displayed) Labs Reviewed  CBC - Abnormal; Notable for the following components:      Result Value   WBC 11.5 (*)    RBC 2.89 (*)    Hemoglobin 8.7 (*)    HCT 28.4 (*)    RDW 15.9 (*)    nRBC 0.4 (*)    All other components within normal limits  APTT - Abnormal; Notable for the following components:   aPTT 39 (*)    All other components within normal limits  PROTIME-INR - Abnormal; Notable for the following components:   Prothrombin Time 16.4 (*)    INR 1.3 (*)    All other components within normal limits  CBG MONITORING, ED - Abnormal; Notable for the following components:   Glucose-Capillary 288 (*)    All other components within normal limits  I-STAT CREATININE, ED - Abnormal; Notable for the following components:   Creatinine, Ser 14.20 (*)    All other components within normal limits  POCT I-STAT EG7 - Abnormal; Notable for the following components:   pCO2, Ven 33.3 (*)     pO2, Ven 59.0 (*)    Bicarbonate 15.0 (*)    TCO2 16 (*)    Acid-base deficit 11.0 (*)    Calcium, Ion 1.13 (*)    HCT 28.0 (*)    Hemoglobin 9.5 (*)    All other components within normal limits  POCT I-STAT 7, (LYTES, BLD GAS, ICA,H+H) - Abnormal; Notable for the following components:   pH, Arterial 7.185 (*)    pO2, Arterial 55.0 (*)    Bicarbonate 15.5 (*)    TCO2 17 (*)    Acid-base deficit 12.0 (*)    Potassium  3.2 (*)    HCT 26.0 (*)    Hemoglobin 8.8 (*)    All other components within normal limits  SARS CORONAVIRUS 2 (HOSPITAL ORDER, PERFORMED IN [facility])  BLOOD GAS, ARTERIAL  BASIC METABOLIC PANEL  LACTIC ACID, PLASMA  BASIC METABOLIC PANEL  PROTIME-INR  PROTIME-INR  APTT  APTT  BLOOD GAS, ARTERIAL  HEPARIN LEVEL (UNFRACTIONATED)  TROPONIN I (HIGH SENSITIVITY)                                                                                                                         EKG  EKG Interpretation  Date/Time:  Thursday May 18 2019 06:51:42 EDT Ventricular Rate:  85 PR Interval:    QRS Duration: 130 QT Interval:  420 QTC Calculation: 500 R Axis:   2 Text Interpretation:  Sinus arrhythmia Ventricular premature complex Prolonged PR interval ** ** ACUTE MI / STEMI ** ** Confirmed by Addison Lank [phone]) on 04/23/2019 7:31:59 AM      Radiology Dg Chest Portable 1 View  Result Date: 05/07/2019 CLINICAL DATA:  Intubation.  Status post CPR. EXAM: PORTABLE CHEST 1 VIEW COMPARISON:  01/31/2019. FINDINGS: Endotracheal tube noted with tip 3 cm above the carina. NG tube noted with tip below hemidiaphragm. Prior CABG and cardiac valve replacement. Cardiomegaly with mild bilateral interstitial prominence. Mild CHF cannot be excluded. No prominent pleural effusion. No pneumothorax. No acute bony abnormality. IMPRESSION: 1. Endotracheal tube noted with tip 3 cm above the carina. NG tube noted with tip below left hemidiaphragm. 2. Prior CABG and cardiac  valve replacement. Cardiomegaly with mild bilateral interstitial prominence. Mild CHF cannot be excluded. Electronically Signed   By: Marcello Moores  Register   On: 05/06/2019 07:32    Pertinent labs & imaging results that were available during my care of the patient were reviewed by me and considered in my medical decision making (see chart for details).  Medications Ordered in ED Medications  midazolam (VERSED) injection 2 mg ( Intravenous MAR Hold 05/21/2019 0753)  midazolam (VERSED) injection 2 mg ( Intravenous MAR Hold 05/22/2019 0753)  amiodarone (NEXTERONE PREMIX) 360-4.14 MG/200ML-% (1.8 mg/mL) IV infusion (60 mg/hr Intravenous New Bag/Given 05/14/2019 0648)  amiodarone (NEXTERONE PREMIX) 360-4.14 MG/200ML-% (1.8 mg/mL) IV infusion (has no administration in time range)  norepinephrine (LEVOPHED) 4mg  in 219mL premix infusion ( Intravenous MAR Hold 04/27/2019 0753)  aspirin suppository 300 mg ( Rectal MAR Hold 05/01/2019 0753)  artificial tears (LACRILUBE) ophthalmic ointment 1 application ( Both Eyes Automatically Held 05/26/19 2200)  0.9 %  sodium chloride infusion (has no administration in time range)  fentaNYL (SUBLIMAZE) injection 50 mcg ( Intravenous MAR Hold 05/17/2019 0753)  fentaNYL 2567mcg in NS 279mL (68mcg/ml) infusion-PREMIX (has no administration in time range)  fentaNYL (SUBLIMAZE) bolus via infusion 25 mcg ( Intravenous MAR Hold 05/03/2019 0753)  propofol (DIPRIVAN) 1000 MG/100ML infusion (has no administration in time range)  pantoprazole (PROTONIX) injection 40 mg ( Intravenous Automatically Held 05/26/19 2200)  heparin bolus  via infusion 3,000 Units ( Intravenous MAR Hold 05/21/2019 0753)  heparin ADULT infusion 100 units/mL (25000 units/248mL sodium chloride 0.45%) (has no administration in time range)  etomidate (AMIDATE) injection (20 mg Intravenous Given 04/24/2019 9326)  rocuronium (ZEMURON) injection (100 mg Intravenous Given 05/01/2019 7124)  aspirin suppository 300 mg (300 mg Rectal Given 04/25/2019  0659)  sodium chloride 0.9 % bolus 1,000 mL (1,000 mLs Intravenous New Bag/Given 05/17/2019 0647)  lidocaine (PF) (XYLOCAINE) 1 % injection (has no administration in time range)  Heparin (Porcine) in NaCl 1000-0.9 UT/500ML-% SOLN (has no administration in time range)                                                                                                                                    Procedures Procedure Name: Intubation Date/Time: 05/10/2019 7:56 AM Performed by: Fatima Blank, MD Pre-anesthesia Checklist: Patient identified, Patient being monitored, Emergency Drugs available, Timeout performed and Suction available Oxygen Delivery Method: Non-rebreather mask Preoxygenation: Pre-oxygenation with 100% oxygen Induction Type: Rapid sequence Ventilation: Mask ventilation without difficulty Laryngoscope Size: Glidescope Tube size: 8.0 mm Placement Confirmation: ETT inserted through vocal cords under direct vision,  CO2 detector and Breath sounds checked- equal and bilateral Secured at: 26 cm Tube secured with: ETT holder Difficulty Due To: Difficulty was anticipated    .Critical Care Performed by: Fatima Blank, MD Authorized by: Fatima Blank, MD    CRITICAL CARE Performed by: Grayce Sessions Caron Tardif Total critical care time: 45 minutes Critical care time was exclusive of separately billable procedures and treating other patients. Critical care was necessary to treat or prevent imminent or life-threatening deterioration. Critical care was time spent personally by me on the following activities: development of treatment plan with patient and/or surrogate as well as nursing, discussions with consultants, evaluation of patient's response to treatment, examination of patient, obtaining history from patient or surrogate, ordering and performing treatments and interventions, ordering and review of laboratory studies, ordering and review of radiographic  studies, pulse oximetry and re-evaluation of patient's condition.    (including critical care time)  Medical Decision Making / ED Course I have reviewed the nursing notes for this encounter and the patient's prior records (if available in EHR or on provided paperwork).   Jeffrey [name] was evaluated in Emergency Department on 05/22/2019 for the symptoms described in the history of present illness. He was evaluated in the context of the global COVID-19 pandemic, which necessitated consideration that the patient might be at risk for infection with the SARS-CoV-2 virus that causes COVID-19. Institutional protocols and algorithms that pertain to the evaluation of patients at risk for COVID-19 are in a state of rapid change based on information released by regulatory bodies including the CDC and federal and state organizations. These policies and algorithms were followed during the patient's care in the ED.  Cardiac arrest Noted to be in V. Fib.  Medicated by EMS as  above.  On arrival airway was secured with ET tube.  Patient had a pulse, and what appeared to be trigeminy.   Converted from epi drip to Levophed.  Started on amiodarone drip.  Cooling protocol initiated.  Discussed with critical care who agreed to continue.  EKG concerning for ST segment elevation MI.  Discussed case with Dr. Irish Lack,  who agreed to initiate code STEMI and take patient to the Cath Lab.  Labs notable for metabolic acidosis.  Hypoxia.  Normal potassium Stable hemoglobin.  I spoke with the patient's brother who informed me that the patient's woke up around 5 AM this morning complaining of not feeling well.  After 10 minutes the patient's brother came back to the room and noted that the patient was unresponsive.  Brother reports that the patient had a clinic visit with his nephrologist yesterday.  There were no acute changes or complications identified.  He had his dialysis yesterday without complication.  Patient  taken to Cath Lab      Final Clinical Impression(s) / ED Diagnoses Final diagnoses:  Cardiac arrest [facility])      This chart was dictated using voice recognition software.  Despite best efforts to proofread,  errors can occur which can change the documentation meaning.   Fatima Blank, MD 05/12/2019 [phone]

## 2019-05-19 ENCOUNTER — Other Ambulatory Visit: Payer: Medicare Other

## 2019-05-19 ENCOUNTER — Inpatient Hospital Stay (HOSPITAL_COMMUNITY): Payer: Medicare Other

## 2019-05-19 DIAGNOSIS — E8729 Other acidosis: Secondary | ICD-10-CM | POA: Diagnosis present

## 2019-05-19 DIAGNOSIS — E872 Acidosis: Secondary | ICD-10-CM

## 2019-05-19 DIAGNOSIS — J9600 Acute respiratory failure, unspecified whether with hypoxia or hypercapnia: Secondary | ICD-10-CM | POA: Diagnosis present

## 2019-05-19 DIAGNOSIS — R579 Shock, unspecified: Secondary | ICD-10-CM | POA: Diagnosis present

## 2019-05-19 LAB — POCT I-STAT, CHEM 8
BUN: 72 mg/dL — ABNORMAL HIGH (ref 8–23)
BUN: 91 mg/dL — ABNORMAL HIGH (ref 8–23)
BUN: 56 mg/dL — ABNORMAL HIGH (ref 8–23)
Calcium, Ion: 0.94 mmol/L — ABNORMAL LOW (ref 1.15–1.40)
Calcium, Ion: 1.06 mmol/L — ABNORMAL LOW (ref 1.15–1.40)
Calcium, Ion: 1.12 mmol/L — ABNORMAL LOW (ref 1.15–1.40)
Chloride: 108 mmol/L (ref 98–111)
Chloride: 102 mmol/L (ref 98–111)
Chloride: 103 mmol/L (ref 98–111)
Creatinine, Ser: 11.3 mg/dL — ABNORMAL HIGH (ref 0.61–1.24)
Creatinine, Ser: 13.6 mg/dL — ABNORMAL HIGH (ref 0.61–1.24)
Creatinine, Ser: 8.6 mg/dL — ABNORMAL HIGH (ref 0.61–1.24)
Glucose, Bld: 123 mg/dL — ABNORMAL HIGH (ref 70–99)
Glucose, Bld: 138 mg/dL — ABNORMAL HIGH (ref 70–99)
Glucose, Bld: 108 mg/dL — ABNORMAL HIGH (ref 70–99)
HCT: 24 % — ABNORMAL LOW (ref 39.0–52.0)
HCT: 27 % — ABNORMAL LOW (ref 39.0–52.0)
HCT: 26 % — ABNORMAL LOW (ref 39.0–52.0)
Hemoglobin: 8.2 g/dL — ABNORMAL LOW (ref 13.0–17.0)
Hemoglobin: 9.2 g/dL — ABNORMAL LOW (ref 13.0–17.0)
Hemoglobin: 8.8 g/dL — ABNORMAL LOW (ref 13.0–17.0)
Potassium: 3.6 mmol/L (ref 3.5–5.1)
Potassium: 4.4 mmol/L (ref 3.5–5.1)
Potassium: 4.3 mmol/L (ref 3.5–5.1)
Sodium: 142 mmol/L (ref 135–145)
Sodium: 137 mmol/L (ref 135–145)
Sodium: 138 mmol/L (ref 135–145)
TCO2: 18 mmol/L — ABNORMAL LOW (ref 22–32)
TCO2: 18 mmol/L — ABNORMAL LOW (ref 22–32)
TCO2: 22 mmol/L (ref 22–32)

## 2019-05-19 LAB — BLOOD GAS, ARTERIAL
Acid-base deficit: 4.7 mmol/L — ABNORMAL HIGH (ref 0.0–2.0)
Bicarbonate: 19.3 mmol/L — ABNORMAL LOW (ref 20.0–28.0)
Drawn by: 10006
FIO2: 40
MECHVT: 620 mL
O2 Saturation: 97.4 %
PEEP: 5 cmH2O
Patient temperature: 91.6
RATE: 14 resp/min
pCO2 arterial: 26.6 mmHg — ABNORMAL LOW (ref 32.0–48.0)
pH, Arterial: 7.452 — ABNORMAL HIGH (ref 7.350–7.450)
pO2, Arterial: 174 mmHg — ABNORMAL HIGH (ref 83.0–108.0)

## 2019-05-19 LAB — BASIC METABOLIC PANEL
Anion gap: 21 — ABNORMAL HIGH (ref 5–15)
BUN: 88 mg/dL — ABNORMAL HIGH (ref 8–23)
CO2: 19 mmol/L — ABNORMAL LOW (ref 22–32)
Calcium: 8.2 mg/dL — ABNORMAL LOW (ref 8.9–10.3)
Chloride: 98 mmol/L (ref 98–111)
Creatinine, Ser: 13.21 mg/dL — ABNORMAL HIGH (ref 0.61–1.24)
GFR calc Af Amer: 4 mL/min — ABNORMAL LOW (ref >60)
GFR calc non Af Amer: 3 mL/min — ABNORMAL LOW (ref >60)
Glucose, Bld: 141 mg/dL — ABNORMAL HIGH (ref 70–99)
Potassium: 4 mmol/L (ref 3.5–5.1)
Sodium: 138 mmol/L (ref 135–145)

## 2019-05-19 LAB — RENAL FUNCTION PANEL
Albumin: 2.8 g/dL — ABNORMAL LOW (ref 3.5–5.0)
Anion gap: 17 — ABNORMAL HIGH (ref 5–15)
BUN: 71 mg/dL — ABNORMAL HIGH (ref 8–23)
CO2: 21 mmol/L — ABNORMAL LOW (ref 22–32)
Calcium: 8.2 mg/dL — ABNORMAL LOW (ref 8.9–10.3)
Chloride: 99 mmol/L (ref 98–111)
Creatinine, Ser: 10.08 mg/dL — ABNORMAL HIGH (ref 0.61–1.24)
GFR calc Af Amer: 6 mL/min — ABNORMAL LOW (ref >60)
GFR calc non Af Amer: 5 mL/min — ABNORMAL LOW (ref >60)
Glucose, Bld: 114 mg/dL — ABNORMAL HIGH (ref 70–99)
Phosphorus: 4.4 mg/dL (ref 2.5–4.6)
Potassium: 4.2 mmol/L (ref 3.5–5.1)
Sodium: 137 mmol/L (ref 135–145)

## 2019-05-19 LAB — PROTIME-INR
INR: 1.3 — ABNORMAL HIGH (ref 0.8–1.2)
Prothrombin Time: 16.3 seconds — ABNORMAL HIGH (ref 11.4–15.2)

## 2019-05-19 LAB — CBC
HCT: 24 % — ABNORMAL LOW (ref 39.0–52.0)
Hemoglobin: 7.9 g/dL — ABNORMAL LOW (ref 13.0–17.0)
MCH: 30.5 pg (ref 26.0–34.0)
MCHC: 32.9 g/dL (ref 30.0–36.0)
MCV: 92.7 fL (ref 80.0–100.0)
Platelets: 235 10*3/uL (ref 150–400)
RBC: 2.59 MIL/uL — ABNORMAL LOW (ref 4.22–5.81)
RDW: 16.4 % — ABNORMAL HIGH (ref 11.5–15.5)
WBC: 11.4 10*3/uL — ABNORMAL HIGH (ref 4.0–10.5)
nRBC: 0 % (ref 0.0–0.2)

## 2019-05-19 LAB — GLUCOSE, CAPILLARY
Glucose-Capillary: 105 mg/dL — ABNORMAL HIGH (ref 70–99)
Glucose-Capillary: 128 mg/dL — ABNORMAL HIGH (ref 70–99)
Glucose-Capillary: 132 mg/dL — ABNORMAL HIGH (ref 70–99)
Glucose-Capillary: 150 mg/dL — ABNORMAL HIGH (ref 70–99)
Glucose-Capillary: 172 mg/dL — ABNORMAL HIGH (ref 70–99)

## 2019-05-19 LAB — PROCALCITONIN: Procalcitonin: 20.59 ng/mL

## 2019-05-19 LAB — APTT
aPTT: >200 seconds (ref 24–36)
aPTT: 46 seconds — ABNORMAL HIGH (ref 24–36)

## 2019-05-19 LAB — PHOSPHORUS: Phosphorus: 5.6 mg/dL — ABNORMAL HIGH (ref 2.5–4.6)

## 2019-05-19 LAB — MAGNESIUM: Magnesium: 1.6 mg/dL — ABNORMAL LOW (ref 1.7–2.4)

## 2019-05-19 MED ORDER — PRISMASOL BGK 4/2.5 32-4-2.5 MEQ/L REPLACEMENT SOLN
Status: DC
  Administered 2019-05-19 – 2019-05-24 (×10): via INTRAVENOUS_CENTRAL
  Filled 2019-05-19 (×15): qty 5000

## 2019-05-19 MED ORDER — SODIUM CHLORIDE 0.9 % FOR CRRT
INTRAVENOUS_CENTRAL | Status: DC | PRN
  Filled 2019-05-19: qty 1000

## 2019-05-19 MED ORDER — MIDAZOLAM 50MG/50ML (1MG/ML) PREMIX INFUSION
1.0000 mg/h | INTRAVENOUS | Status: DC
  Administered 2019-05-19: 12:00:00 2 mg/h via INTRAVENOUS
  Administered 2019-05-19: 18:00:00 4 mg/h via INTRAVENOUS
  Administered 2019-05-20: 8 mg/h via INTRAVENOUS
  Filled 2019-05-19 (×3): qty 50

## 2019-05-19 MED ORDER — PRISMASOL BGK 4/2.5 32-4-2.5 MEQ/L IV SOLN
INTRAVENOUS | Status: DC
  Administered 2019-05-19 – 2019-05-24 (×38): via INTRAVENOUS_CENTRAL
  Filled 2019-05-19 (×55): qty 5000

## 2019-05-19 MED ORDER — MAGNESIUM SULFATE 2 GM/50ML IV SOLN
2.0000 g | Freq: Once | INTRAVENOUS | Status: AC
  Administered 2019-05-19: 07:00:00 2 g via INTRAVENOUS
  Filled 2019-05-19: qty 50

## 2019-05-19 MED ORDER — HEPARIN SODIUM (PORCINE) 1000 UNIT/ML DIALYSIS
1000.0000 [IU] | INTRAMUSCULAR | Status: DC | PRN
  Administered 2019-05-19 – 2019-05-22 (×2): 3200 [IU] via INTRAVENOUS_CENTRAL
  Administered 2019-05-24 (×2): 1300 [IU] via INTRAVENOUS_CENTRAL
  Filled 2019-05-19: qty 6
  Filled 2019-05-19: qty 5
  Filled 2019-05-19 (×2): qty 6
  Filled 2019-05-19: qty 4
  Filled 2019-05-19 (×3): qty 6

## 2019-05-19 MED ORDER — PRISMASOL BGK 4/2.5 32-4-2.5 MEQ/L REPLACEMENT SOLN
Status: DC
  Administered 2019-05-19 – 2019-05-24 (×10): via INTRAVENOUS_CENTRAL
  Filled 2019-05-19 (×15): qty 5000

## 2019-05-19 MED ORDER — INSULIN ASPART 100 UNIT/ML ~~LOC~~ SOLN
1.0000 [IU] | SUBCUTANEOUS | Status: DC
  Administered 2019-05-19 – 2019-05-25 (×15): 1 [IU] via SUBCUTANEOUS

## 2019-05-19 MED ORDER — NOREPINEPHRINE 16 MG/250ML-% IV SOLN
0.0000 ug/min | INTRAVENOUS | Status: DC
  Administered 2019-05-19: 19:00:00 4 ug/min via INTRAVENOUS
  Filled 2019-05-19: qty 250

## 2019-05-19 NOTE — Progress Notes (Signed)
RT notified Salvadore Dom NP of unsuccessful attempts by two RT at arterial line insertion. Verbal order received to cancel order at this time.

## 2019-05-19 NOTE — Progress Notes (Addendum)
PCCM Interval Progress Note  Called for significant oozing from L IJ CVL insertion site.   1 suture placed with good hemostasis.  Site cleaned and new dressing applied.   Montey Hora, [facility] Pager: [phone].  If no answer, [phone] 05/19/2019, 2:24 AM

## 2019-05-19 NOTE — Progress Notes (Signed)
[facility] ROUNDING NOTE   Subjective:   This is a 63 year old gentleman end-stage renal disease on home hemodialysis history of hepatitis B and HIV and seizure disorders history of aortic valve replacement 07/2018.  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  He was admitted with a V. fib arrest found to have STEMI.  EMS was called and performed 35 minutes CPR with 5 rounds of epinephrine 1 calcium 1 bicarbonate and 450 mg of amiodarone.  He was taken to the Cath Lab and found to have a mid LAD occlusion status post Synergy DES with 80% distal LAD status post balloon angioplasty appreciate assistance of Dr. Irish Lack.  Blood pressure 147/75 pulse 65 temperature 90.9 O2 sats 100% FiO2 40%  IV amiodarone stopped 05/16/2019 IV norepinephrine Cangrelor drip.04/30/2019  Sodium 138 potassium 4.0 chloride 198 CO2 19 BUN 88 creatinine 13.2 glucose 141 calcium 8.2 phosphorus 5.6 magnesium 1.6 INR 1.3  Aspirin 81 mg daily, Brilinta 90 mg daily     Objective:  Vital signs in last 24 hours:  Temp:  [89.2 F (31.8 C)-93.9 F (34.4 C)] 90.9 F (32.7 C) (08/28 0700) Pulse Rate:  [41-91] 52 (08/28 0700) Resp:  [0-28] 20 (08/28 0700) BP: (83-216)/(48-104) 152/81 (08/28 0700) SpO2:  [85 %-100 %] 100 % (08/28 0700) Arterial Line BP: (86-178)/(48-84) 118/63 (08/27 1700) FiO2 (%):  [40 %-100 %] 40 % (08/28 0310) Weight:  [89.9 kg] 89.9 kg (08/28 0500)  Weight change: 2.91 kg Filed Weights   05/20/2019 0650 05/19/19 0500  Weight: 87 kg 89.9 kg    Intake/Output: I/O last 3 completed shifts: In: 2862 [I.V.:2452.3; NG/GT:340; IV Piggyback:69.7] Out: 0    Intake/Output this shift:  No intake/output data recorded. Ill-appearing intubated sedated OG tube cooling pads CVS-regular rate and rhythm no murmurs rubs gallops appreciated JVP not elevated RS-clear to auscultation ventilated breath sounds ABD- BS present soft  non-distended EXT- no edema left AV fistula   Basic Metabolic Panel: Recent Labs  Lab 04/25/2019 0647  04/24/2019 0713  04/30/2019 1616  05/09/2019 1633 04/22/2019 2041 05/02/2019 2222 05/19/19 0419 05/19/19 0435  NA 136   < > 138   < >  --    < > 138 137 137 142 138  K 3.7   < > 3.2*   < >  --    < > 3.8 4.5 4.4 3.6 4.0  CL 96*  --  98   < >  --   --  106 102 101 108 98  CO2 14*  --  17*  --   --   --   --   --   --   --  19*  GLUCOSE 283*  --  259*   < >  --   --  113* 119* 134* 123* 141*  BUN 83*  --  83*   < >  --   --  73* 86* 88* 72* 88*  CREATININE 14.39*   < > 13.12*   < >  --   --  12.20* 13.60* 13.20* 11.30* 13.21*  CALCIUM 9.3  --  8.3*  --   --   --   --   --   --   --  8.2*  MG  --   --   --   --   --   --   --   --   --   --  1.6*  PHOS  --   --   --   --  4.5  --   --   --   --   --  5.6*   < > = values in this interval not displayed.    Liver Function Tests: No results for input(s): AST, ALT, ALKPHOS, BILITOT, PROT, ALBUMIN in the last 168 hours. No results for input(s): LIPASE, AMYLASE in the last 168 hours. No results for input(s): AMMONIA in the last 168 hours.  CBC: Recent Labs  Lab 04/23/2019 0647  05/14/2019 1007  05/06/2019 1629 05/17/2019 1633 05/22/2019 2041 05/17/2019 2222 05/19/19 0419  WBC 11.5*  --  13.3*  --   --   --   --   --   --   HGB 8.7*   < > 8.8*   < > 8.8* 12.9* 9.2* 9.2* 8.2*  HCT 28.4*   < > 27.5*   < > 26.0* 38.0* 27.0* 27.0* 24.0*  MCV 98.3  --  94.2  --   --   --   --   --   --   PLT 265  --  200  --   --   --   --   --   --    < > = values in this interval not displayed.    Cardiac Enzymes: No results for input(s): CKTOTAL, CKMB, CKMBINDEX, TROPONINI in the last 168 hours.  BNP: Invalid input(s): POCBNP  CBG: Recent Labs  Lab 04/26/2019 1909 05/14/2019 2128 05/19/19 0031 05/19/19 0317 05/19/19 0701  GLUCAP 112* 124* 132* 172* 150*    Microbiology: Results for orders placed or performed during the hospital encounter of 05/01/2019   SARS Coronavirus 2 [facility] order, Performed in [facility] hospital lab) Nasopharyngeal Nasopharyngeal Swab     Status: None   Collection Time: 05/17/2019  6:55 AM   Specimen: Nasopharyngeal Swab  Result Value Ref Range Status   SARS Coronavirus 2 NEGATIVE NEGATIVE Final    Comment: (NOTE) If result is NEGATIVE SARS-CoV-2 target nucleic acids are NOT DETECTED. The SARS-CoV-2 RNA is generally detectable in upper and lower  respiratory specimens during the acute phase of infection. The lowest  concentration of SARS-CoV-2 viral copies this assay can detect is 250  copies / mL. A negative result does not preclude SARS-CoV-2 infection  and should not be used as the sole basis for treatment or other  patient management decisions.  A negative result may occur with  improper specimen collection / handling, submission of specimen other  than nasopharyngeal swab, presence of viral mutation(s) within the  areas targeted by this assay, and inadequate number of viral copies  (<250 copies / mL). A negative result must be combined with clinical  observations, patient history, and epidemiological information. If result is POSITIVE SARS-CoV-2 target nucleic acids are DETECTED. The SARS-CoV-2 RNA is generally detectable in upper and lower  respiratory specimens dur ing the acute phase of infection.  Positive  results are indicative of active infection with SARS-CoV-2.  Clinical  correlation with patient history and other diagnostic information is  necessary to determine patient infection status.  Positive results do  not rule out bacterial infection or co-infection with other viruses. If result is PRESUMPTIVE POSTIVE SARS-CoV-2 nucleic acids MAY BE PRESENT.   A presumptive positive result was obtained on the submitted specimen  and confirmed on repeat testing.  While 2019 novel coronavirus  (SARS-CoV-2) nucleic acids may be present in the submitted sample  additional confirmatory testing may be  necessary for epidemiological  and / or clinical management purposes  to  differentiate between  SARS-CoV-2 and other Sarbecovirus currently known to infect humans.  If clinically indicated additional testing with an alternate test  methodology [phone]) is advised. The SARS-CoV-2 RNA is generally  detectable in upper and lower respiratory sp ecimens during the acute  phase of infection. The expected result is Negative. Fact Sheet for Patients:  StrictlyIdeas.no Fact Sheet for Healthcare Providers: BankingDealers.co.za This test is not yet approved or cleared by the [place] FDA and has been authorized for detection and/or diagnosis of SARS-CoV-2 by FDA under an Emergency Use Authorization (EUA).  This EUA will remain in effect (meaning this test can be used) for the duration of the COVID-19 declaration under Section 564(b)(1) of the Act, 21 U.S.C. section 360bbb-3(b)(1), unless the authorization is terminated or revoked sooner. Performed at [facility], [place] [address], [place], [place] [address]   MRSA PCR Screening     Status: None   Collection Time: 04/25/2019 10:24 AM   Specimen: Nasal Mucosa; Nasopharyngeal  Result Value Ref Range Status   MRSA by PCR NEGATIVE NEGATIVE Final    Comment:        The GeneXpert MRSA Assay (FDA approved for NASAL specimens only), is one component of a comprehensive MRSA colonization surveillance program. It is not intended to diagnose MRSA infection nor to guide or monitor treatment for MRSA infections. Performed at [facility], [place] [address] [place], [place] [address]     Coagulation Studies: Recent Labs    05/12/2019 1027 05/20/2019 0713 04/22/2019 1430 05/19/19 0448  LABPROT 16.4* 18.7* 17.7* 16.3*  INR 1.3* 1.6* 1.5* 1.3*    Urinalysis: No results for input(s): COLORURINE, LABSPEC, PHURINE, GLUCOSEU, HGBUR, BILIRUBINUR, KETONESUR, PROTEINUR, UROBILINOGEN, NITRITE,  LEUKOCYTESUR in the last 72 hours.  Invalid input(s): APPERANCEUR    Imaging: Dg Chest Port 1 View  Result Date: 05/17/2019 CLINICAL DATA:  central line placement EXAM: PORTABLE CHEST 1 VIEW COMPARISON:  Chest radiograph dated 05/16/2019 FINDINGS: Interval placement of a left central venous catheter with tip overlying the brachiocephalic vein. Esophageal probe tip overlies the mid/upper esophagus. Endotracheal tube tip lies approximately 2 cm above the carina. Nasogastric tube remains in place. Stable enlarged cardiomediastinal contours status post median sternotomy and valve replacement. Scattered left lung linear opacities likely reflect atelectasis. No definite pleural effusion though the left costophrenic angle is excluded from field of view. No pneumothorax. IMPRESSION: Interval placement of left central venous catheter with tip overlying the brachiocephalic vein. Consider advancement into the SVC. Electronically Signed   By: Audie Pinto M.D.   On: 04/30/2019 13:38   Dg Chest Portable 1 View  Result Date: 05/16/2019 CLINICAL DATA:  Intubation.  Status post CPR. EXAM: PORTABLE CHEST 1 VIEW COMPARISON:  01/31/2019. FINDINGS: Endotracheal tube noted with tip 3 cm above the carina. NG tube noted with tip below hemidiaphragm. Prior CABG and cardiac valve replacement. Cardiomegaly with mild bilateral interstitial prominence. Mild CHF cannot be excluded. No prominent pleural effusion. No pneumothorax. No acute bony abnormality. IMPRESSION: 1. Endotracheal tube noted with tip 3 cm above the carina. NG tube noted with tip below left hemidiaphragm. 2. Prior CABG and cardiac valve replacement. Cardiomegaly with mild bilateral interstitial prominence. Mild CHF cannot be excluded. Electronically Signed   By: Holden   On: 04/25/2019 07:32     Medications:   . sodium chloride 50 mL/hr at 05/19/19 0700  . sodium chloride    . amiodarone Stopped (05/14/2019 2042)  . cisatracurium (NIMBEX)  infusion 1 mcg/kg/min (  05/19/19 0700)  . fentaNYL infusion INTRAVENOUS 250 mcg/hr (05/19/19 0700)  . insulin    . norepinephrine (LEVOPHED) Adult infusion Stopped (05/19/19 0223)  . propofol (DIPRIVAN) infusion 5 mcg/kg/min (05/19/19 0700)   . artificial tears  1 application Both Eyes A3T  . aspirin  81 mg Per Tube Daily  . bictegravir-emtricitabine-tenofovir AF  1 tablet Oral Daily  . chlorhexidine gluconate (MEDLINE KIT)  15 mL Mouth Rinse BID  . Chlorhexidine Gluconate Cloth  6 each Topical Daily  . feeding supplement (VITAL HIGH PROTEIN)  1,000 mL Per Tube Q24H  . fentaNYL (SUBLIMAZE) injection  50 mcg Intravenous Once  . heparin  5,000 Units Subcutaneous Q8H  . mouth rinse  15 mL Mouth Rinse 10 times per day  . pantoprazole (PROTONIX) IV  40 mg Intravenous QHS  . sodium chloride flush  3 mL Intravenous Q12H  . ticagrelor  90 mg Per Tube BID   sodium chloride, acetaminophen (TYLENOL) oral liquid 160 mg/5 mL, acetaminophen, albuterol, cisatracurium, fentaNYL, midazolam, ondansetron (ZOFRAN) IV, sodium chloride flush  Assessment/ Plan:   End-stage renal disease hemodialysis for many years.  Has a functioning AV fistula however I believe he will need to start CRRT.  Needs consult critical care medicine in order to place dialysis catheter.  V. fib cardiac arrest STEMI appreciate assistance Dr. Irish Lack, DES stent to LAD lesion   Hypertension volume continues on norepinephrine  Anemia stable we will continue to follow  Metabolic bone disease we will continue to follow  Hepatitis B  HIV  Seizure disorder   LOS: [place] '@TODAY' '@8' :36 AM

## 2019-05-19 NOTE — Progress Notes (Signed)
Sputum sample obtained and sent down to main lab without complications.  

## 2019-05-19 NOTE — Progress Notes (Addendum)
NAME:  Jeffrey [name], MRN:  [id number], DOB:  10-19-1955, LOS: 1 ADMISSION DATE:  04/30/2019, CONSULTATION DATE:  8/27REFERRING MD:  cardama, CHIEF COMPLAINT:  Cardiac arrest   Brief History   63 year old male patient with end-stage renal disease on home dialysis admitted 8/27 status post VF arrest estimated time to ROSC 35 minutes.  Past Medical History  Prior aortic valve insufficiency, status post aortic valve endocarditis requiring aortic valve replacement 2019 Known diffuse nonobstructive coronary artery disease HIV Hepatitis B End-stage renal disease on home dialysis Hypothyroidism Peripheral vascular disease Prior GI bleed Coronary artery disease Prior left VATS for fibrothorax following bacteremia in February 2020  [place] [facility] Events   8/27: Admitted status post VF arrest.  Estimated downtime 35 minutes.  Went for left heart cath, findings:Mid LAD lesion 100% stenosis, drug-eluting stent was successfully placed.  A distal/apical LAD lesion was stenosed at 80% and this was treated with PTCA.  He had nonobstructive disease involving the circumflex and RCA.  Hypothermia protocol initiated at: 1104 am temp goal reached 8/28 start re-warming at 11 am. Still pressor dependent. Left IJ site oozing required extra suture. EEg neg for seizure. Amiodarone stopped d/t bradycardia. Sending sputum and PCTs. Placing HD cath for CRRT.   Consults:  Cardiology   Procedures:  oett 8/27>>> Left IJ CVL 8/27>>> Aline 8/28 Right IJ HD cath 8/28>>>  Significant Diagnostic Tests:  Left heart cath 8/28: Mid LAD lesion 100% stenosis, drug-eluting stent was successfully placed.  A distal/apical LAD lesion was stenosed at 80% and this was treated with PTCA.  He had nonobstructive disease involving the circumflex and RCA. EEG 8/27>>>no seizure    Micro Data:  Corona virus 8/27: neg Respiratory culture 8/28>>>  Antimicrobials:    Interim history/subjective:  amio stopped overnight  Hypotensive   Objective   Blood pressure (Abnormal) 147/75, pulse (Abnormal) 55, temperature (Abnormal) 90.9 F (32.7 C), temperature source Esophageal, resp. rate 18, height 6' (1.829 m), weight 89.9 kg, SpO2 100 %.    Vent Mode: PRVC FiO2 (%):  [40 %-100 %] 40 % Set Rate:  [14 bmp] 14 bmp Vt Set:  [620 mL] 620 mL PEEP:  [5 cmH20] 5 cmH20 Plateau Pressure:  [18 cmH20-20 cmH20] 19 cmH20   Intake/Output Summary (Last 24 hours) at 05/19/2019 0914 Last data filed at 05/19/2019 0700 Gross per 24 hour  Intake 2861.97 ml  Output 0 ml  Net 2861.97 ml   Filed Weights   04/23/2019 0650 05/19/19 0500  Weight: 87 kg 89.9 kg    Examination:  General 63 year old black male. Sedated and paralyzed on NMB HENT NCAT orally intubated left IJ dressing is CD&I Pulm clear, decreased bases Card RRR no MRG EXT no sig edema lue w/ good bruit and thrill from avg site abd soft gu anuric Neuro sedated and on NMB  Resolved Hospital Problem list     Assessment & Plan:   VF arrest in setting of severe CAD, now s/p DES to mid LAD and PTCA to distal/apical LAD lesion -Known history of coronary artery disease with prior CABG and aortic valve replacement Plan Cont tele  Cont uninterrupted dual platelet therapy for at least 12 months  Further recs per cards Amiodarone stopped d/t bradycardia  Circulatory shock.  New CM w/ EF 45-50% and mod dyskinesis. Not sure if shock state is all cardiogenic, also consider hypothermia affect or also sepsis Plan Ck PCT Cont to titrate noreepi for MAP >70 Cont tele   Acute respiratory failure  s/p cardiopulm arrest.  Chest x-ray: ett good position. Some increase in interstitial changes Plan Cont full vent support PAD protocol RASS goal -5; then change to 0 to -1 after reaches rewarming phase.  VAP bundle PCXR in am  Decreased Ve we are over-ventilating)  Acute metabolic encephalopathy: At risk for anoxic injury ->EEG neg for seizure  Plan Begin warming  phase at 11am Will need CT brain after we have met rewarming phase.   End-stage renal disease w/ residual anion gap metabolic acidosis (lactic acid was 6.6-->suspect just clearing slower).  - on home dialysis Plan F/u lactic acid For CRRT today after HD cath placement    Intermittent Fluid and electrolyte imbalance Plan Serial blood chemistries Replace as indicated  Anemia of chronic disease Plan Trend cbc   Leukocytosis Plan Awaiting CBC Sputum to be sent today Low threshold for abx coverage if wbc rising   Hyperglycemia Plan Hyperglycemia protocol   H/o  HIV and hep B plan Am LFTs Cont Biktarvy   Best practice:  Diet: NPO Pain/Anxiety/Delirium protocol (if indicated): hypothermia protocol 8/27 VAP protocol (if indicated): 8/27 DVT prophylaxis: [place] heparin  GI prophylaxis: PPI Glucose control: 8/27 Mobility: BR Code Status: full code Family Communication: brother updated Disposition: remains critically ill d/t respiratory failure, probable anoxic brain injury (on hypothermia protocol), and circulatory shock.   Critical care time: 32 minutes     Erick Colace ACNP-BC [place] Pager # [phone] OR # [phone] if no answer

## 2019-05-19 NOTE — Progress Notes (Signed)
eLink Physician-Brief Progress Note Patient Name: Jeffrey [name] DOB: 1955/11/22 MRN: [id number]   Date of Service  05/19/2019  HPI/Events of Note  Mg ++  1.6  eICU Interventions  MgSo4 2 gm iv  Over 2 hours x 1        [place] U Ogan 05/19/2019, 6:43 AM

## 2019-05-19 NOTE — Procedures (Signed)
Hemodialysis Catheter Insertion Procedure Note Jeffrey [name] [id number] February 23, 1956  Procedure: Insertion of Hemodialysis Catheter Indications: Dialysis Access   Procedure Details Consent: Unable to obtain consent because of altered level of consciousness. Time Out: Verified patient identification, verified procedure, site/side was marked, verified correct patient position, special equipment/implants available, medications/allergies/relevent history reviewed, required imaging and test results available.  Performed  Maximum sterile technique was used including antiseptics, cap, gloves, gown, hand hygiene, mask and sheet. Skin prep: Chlorhexidine; local anesthetic administered Triple lumen hemodialysis catheter was inserted into left femoral vein due to patient being a dialysis patient using the Seldinger technique. [name] used to verify placement  Evaluation Blood flow good Complications: No apparent complications Patient did tolerate procedure well.    Jeffrey [name] 05/19/2019

## 2019-05-20 ENCOUNTER — Inpatient Hospital Stay (HOSPITAL_COMMUNITY): Payer: Medicare Other

## 2019-05-20 DIAGNOSIS — J9600 Acute respiratory failure, unspecified whether with hypoxia or hypercapnia: Secondary | ICD-10-CM

## 2019-05-20 LAB — POCT I-STAT, CHEM 8
BUN: 36 mg/dL — ABNORMAL HIGH (ref 8–23)
BUN: 37 mg/dL — ABNORMAL HIGH (ref 8–23)
BUN: 40 mg/dL — ABNORMAL HIGH (ref 8–23)
BUN: 43 mg/dL — ABNORMAL HIGH (ref 8–23)
BUN: 49 mg/dL — ABNORMAL HIGH (ref 8–23)
BUN: 35 mg/dL — ABNORMAL HIGH (ref 8–23)
Calcium, Ion: 1.09 mmol/L — ABNORMAL LOW (ref 1.15–1.40)
Calcium, Ion: 1.11 mmol/L — ABNORMAL LOW (ref 1.15–1.40)
Calcium, Ion: 1.13 mmol/L — ABNORMAL LOW (ref 1.15–1.40)
Calcium, Ion: 1.14 mmol/L — ABNORMAL LOW (ref 1.15–1.40)
Calcium, Ion: 1.16 mmol/L (ref 1.15–1.40)
Calcium, Ion: 1.15 mmol/L (ref 1.15–1.40)
Chloride: 103 mmol/L (ref 98–111)
Chloride: 105 mmol/L (ref 98–111)
Chloride: 106 mmol/L (ref 98–111)
Chloride: 106 mmol/L (ref 98–111)
Chloride: 107 mmol/L (ref 98–111)
Chloride: 105 mmol/L (ref 98–111)
Creatinine, Ser: 6.2 mg/dL — ABNORMAL HIGH (ref 0.61–1.24)
Creatinine, Ser: 6.5 mg/dL — ABNORMAL HIGH (ref 0.61–1.24)
Creatinine, Ser: 6.5 mg/dL — ABNORMAL HIGH (ref 0.61–1.24)
Creatinine, Ser: 6.8 mg/dL — ABNORMAL HIGH (ref 0.61–1.24)
Creatinine, Ser: 7.6 mg/dL — ABNORMAL HIGH (ref 0.61–1.24)
Creatinine, Ser: 5.9 mg/dL — ABNORMAL HIGH (ref 0.61–1.24)
Glucose, Bld: 101 mg/dL — ABNORMAL HIGH (ref 70–99)
Glucose, Bld: 101 mg/dL — ABNORMAL HIGH (ref 70–99)
Glucose, Bld: 102 mg/dL — ABNORMAL HIGH (ref 70–99)
Glucose, Bld: 107 mg/dL — ABNORMAL HIGH (ref 70–99)
Glucose, Bld: 110 mg/dL — ABNORMAL HIGH (ref 70–99)
Glucose, Bld: 121 mg/dL — ABNORMAL HIGH (ref 70–99)
HCT: 24 % — ABNORMAL LOW (ref 39.0–52.0)
HCT: 24 % — ABNORMAL LOW (ref 39.0–52.0)
HCT: 25 % — ABNORMAL LOW (ref 39.0–52.0)
HCT: 25 % — ABNORMAL LOW (ref 39.0–52.0)
HCT: 26 % — ABNORMAL LOW (ref 39.0–52.0)
HCT: 26 % — ABNORMAL LOW (ref 39.0–52.0)
Hemoglobin: 8.2 g/dL — ABNORMAL LOW (ref 13.0–17.0)
Hemoglobin: 8.2 g/dL — ABNORMAL LOW (ref 13.0–17.0)
Hemoglobin: 8.5 g/dL — ABNORMAL LOW (ref 13.0–17.0)
Hemoglobin: 8.5 g/dL — ABNORMAL LOW (ref 13.0–17.0)
Hemoglobin: 8.8 g/dL — ABNORMAL LOW (ref 13.0–17.0)
Hemoglobin: 8.8 g/dL — ABNORMAL LOW (ref 13.0–17.0)
Potassium: 3.9 mmol/L (ref 3.5–5.1)
Potassium: 4.1 mmol/L (ref 3.5–5.1)
Potassium: 4.3 mmol/L (ref 3.5–5.1)
Potassium: 4.4 mmol/L (ref 3.5–5.1)
Potassium: 4.6 mmol/L (ref 3.5–5.1)
Potassium: 4.6 mmol/L (ref 3.5–5.1)
Sodium: 136 mmol/L (ref 135–145)
Sodium: 137 mmol/L (ref 135–145)
Sodium: 138 mmol/L (ref 135–145)
Sodium: 139 mmol/L (ref 135–145)
Sodium: 140 mmol/L (ref 135–145)
Sodium: 138 mmol/L (ref 135–145)
TCO2: 21 mmol/L — ABNORMAL LOW (ref 22–32)
TCO2: 21 mmol/L — ABNORMAL LOW (ref 22–32)
TCO2: 22 mmol/L (ref 22–32)
TCO2: 23 mmol/L (ref 22–32)
TCO2: 24 mmol/L (ref 22–32)
TCO2: 21 mmol/L — ABNORMAL LOW (ref 22–32)

## 2019-05-20 LAB — POCT I-STAT 7, (LYTES, BLD GAS, ICA,H+H)
Acid-base deficit: 3 mmol/L — ABNORMAL HIGH (ref 0.0–2.0)
Bicarbonate: 22.2 mmol/L (ref 20.0–28.0)
Calcium, Ion: 1.17 mmol/L (ref 1.15–1.40)
HCT: 26 % — ABNORMAL LOW (ref 39.0–52.0)
Hemoglobin: 8.8 g/dL — ABNORMAL LOW (ref 13.0–17.0)
O2 Saturation: 98 %
Patient temperature: 37.1
Potassium: 5.2 mmol/L — ABNORMAL HIGH (ref 3.5–5.1)
Sodium: 137 mmol/L (ref 135–145)
TCO2: 23 mmol/L (ref 22–32)
pCO2 arterial: 36.9 mmHg (ref 32.0–48.0)
pH, Arterial: 7.387 (ref 7.350–7.450)
pO2, Arterial: 114 mmHg — ABNORMAL HIGH (ref 83.0–108.0)

## 2019-05-20 LAB — RENAL FUNCTION PANEL
Albumin: 2.7 g/dL — ABNORMAL LOW (ref 3.5–5.0)
Anion gap: 12 (ref 5–15)
BUN: 32 mg/dL — ABNORMAL HIGH (ref 8–23)
CO2: 23 mmol/L (ref 22–32)
Calcium: 8.4 mg/dL — ABNORMAL LOW (ref 8.9–10.3)
Chloride: 102 mmol/L (ref 98–111)
Creatinine, Ser: 4.48 mg/dL — ABNORMAL HIGH (ref 0.61–1.24)
GFR calc Af Amer: 15 mL/min — ABNORMAL LOW (ref >60)
GFR calc non Af Amer: 13 mL/min — ABNORMAL LOW (ref >60)
Glucose, Bld: 154 mg/dL — ABNORMAL HIGH (ref 70–99)
Phosphorus: 2.4 mg/dL — ABNORMAL LOW (ref 2.5–4.6)
Potassium: 4.5 mmol/L (ref 3.5–5.1)
Sodium: 137 mmol/L (ref 135–145)

## 2019-05-20 LAB — CBC
HCT: 26.2 % — ABNORMAL LOW (ref 39.0–52.0)
Hemoglobin: 8.3 g/dL — ABNORMAL LOW (ref 13.0–17.0)
MCH: 29.6 pg (ref 26.0–34.0)
MCHC: 31.7 g/dL (ref 30.0–36.0)
MCV: 93.6 fL (ref 80.0–100.0)
Platelets: 219 10*3/uL (ref 150–400)
RBC: 2.8 MIL/uL — ABNORMAL LOW (ref 4.22–5.81)
RDW: 16.9 % — ABNORMAL HIGH (ref 11.5–15.5)
WBC: 14.3 10*3/uL — ABNORMAL HIGH (ref 4.0–10.5)
nRBC: 0 % (ref 0.0–0.2)

## 2019-05-20 LAB — PHOSPHORUS: Phosphorus: 2.8 mg/dL (ref 2.5–4.6)

## 2019-05-20 LAB — COMPREHENSIVE METABOLIC PANEL
ALT: 43 U/L (ref 0–44)
AST: 86 U/L — ABNORMAL HIGH (ref 15–41)
Albumin: 3.1 g/dL — ABNORMAL LOW (ref 3.5–5.0)
Alkaline Phosphatase: 87 U/L (ref 38–126)
Anion gap: 13 (ref 5–15)
BUN: 41 mg/dL — ABNORMAL HIGH (ref 8–23)
CO2: 22 mmol/L (ref 22–32)
Calcium: 8.6 mg/dL — ABNORMAL LOW (ref 8.9–10.3)
Chloride: 102 mmol/L (ref 98–111)
Creatinine, Ser: 6.33 mg/dL — ABNORMAL HIGH (ref 0.61–1.24)
GFR calc Af Amer: 10 mL/min — ABNORMAL LOW (ref >60)
GFR calc non Af Amer: 8 mL/min — ABNORMAL LOW (ref >60)
Glucose, Bld: 112 mg/dL — ABNORMAL HIGH (ref 70–99)
Potassium: 4.6 mmol/L (ref 3.5–5.1)
Sodium: 137 mmol/L (ref 135–145)
Total Bilirubin: 0.4 mg/dL (ref 0.3–1.2)
Total Protein: 6.7 g/dL (ref 6.5–8.1)

## 2019-05-20 LAB — GLUCOSE, CAPILLARY
Glucose-Capillary: 112 mg/dL — ABNORMAL HIGH (ref 70–99)
Glucose-Capillary: 149 mg/dL — ABNORMAL HIGH (ref 70–99)
Glucose-Capillary: 77 mg/dL (ref 70–99)

## 2019-05-20 LAB — TRIGLYCERIDES: Triglycerides: 191 mg/dL — ABNORMAL HIGH (ref <150)

## 2019-05-20 LAB — MAGNESIUM: Magnesium: 2.1 mg/dL (ref 1.7–2.4)

## 2019-05-20 MED ORDER — NOREPINEPHRINE 16 MG/250ML-% IV SOLN: 0.0000 ug/min | INTRAVENOUS | Status: DC

## 2019-05-20 MED ORDER — MAGNESIUM SULFATE 4 GM/100ML IV SOLN
4.0000 g | Freq: Once | INTRAVENOUS | Status: AC
  Administered 2019-05-20: 4 g via INTRAVENOUS
  Filled 2019-05-20: qty 100

## 2019-05-20 MED ORDER — SODIUM CHLORIDE 0.9 % IV SOLN
2.0000 g | Freq: Two times a day (BID) | INTRAVENOUS | Status: DC
  Administered 2019-05-20 – 2019-05-24 (×9): 2 g via INTRAVENOUS
  Filled 2019-05-20 (×10): qty 2

## 2019-05-20 MED ORDER — NOREPINEPHRINE 4 MG/250ML-% IV SOLN: 0.0000 ug/min | INTRAVENOUS | Status: DC

## 2019-05-20 MED ORDER — FENTANYL CITRATE (PF) 100 MCG/2ML IJ SOLN
25.0000 ug | INTRAMUSCULAR | Status: DC | PRN
  Administered 2019-05-21: 100 ug via INTRAVENOUS
  Filled 2019-05-20 (×3): qty 2

## 2019-05-20 MED ORDER — MIDAZOLAM HCL 2 MG/2ML IJ SOLN
1.0000 mg | INTRAMUSCULAR | Status: DC | PRN
  Administered 2019-05-24: 2 mg via INTRAVENOUS
  Filled 2019-05-20: qty 2

## 2019-05-20 MED ORDER — PANTOPRAZOLE SODIUM 40 MG PO PACK
40.0000 mg | PACK | Freq: Every day | ORAL | Status: DC
  Administered 2019-05-20 – 2019-06-01 (×13): 40 mg
  Filled 2019-05-20 (×13): qty 20

## 2019-05-20 NOTE — Progress Notes (Addendum)
[facility] ROUNDING NOTE   Subjective:   This is a 63 year old gentleman end-stage renal disease on home hemodialysis history of hepatitis B and HIV and seizure disorders history of aortic valve replacement 07/2018.  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  He was admitted with a V. fib arrest found to have STEMI.  EMS was called and performed 35 minutes CPR with 5 rounds of epinephrine 1 calcium 1 bicarbonate and 450 mg of amiodarone.  He was taken to the Cath Lab and found to have a mid LAD occlusion status post Synergy DES with 80% distal LAD status post balloon angioplasty appreciate assistance of Dr. Irish Lack.  Blood pressure 104/78 pulse 108 temperature 97.5 O2 sats 98% FiO2 40%  pH 7.38 sodium 138 potassium 4.6 chloride 105 CO2 22 BUN 35 glucose 121 creatinine 5.9 ionized calcium 1.15 phosphorus 2.8 magnesium 2.1 albumin 3.1 AST 86 ALT 43.  Total calcium 8.6  Biktarvy 1 daily, Protonix 40 mg daily, Brilinta 90 mg daily, aspirin 81 mg daily  Levophed IV    Objective:  Vital signs in last 24 hours:  Temp:  [90.9 F (32.7 C)-98.8 F (37.1 C)] 97.5 F (36.4 C) (08/29 0700) Pulse Rate:  [40-105] 101 (08/29 0435) Resp:  [12-30] 16 (08/29 0700) BP: (65-221)/(47-104) 100/63 (08/29 0700) SpO2:  [94 %-100 %] 100 % (08/29 0700) FiO2 (%):  [40 %] 40 % (08/29 0435) Weight:  [88.3 kg] 88.3 kg (08/29 0435)  Weight change: -1.61 kg Filed Weights   05/03/2019 0650 05/19/19 0500 05/20/19 0435  Weight: 87 kg 89.9 kg 88.3 kg    Intake/Output: I/O last 3 completed shifts: In: 4142.5 [I.V.:2347.7; NG/GT:1699; IV Piggyback:95.8] Out: 2085 [Other:2085]   Intake/Output this shift:  No intake/output data recorded. Ill-appearing intubated sedated OG tube cooling pads CVS-regular rate and rhythm no murmurs rubs gallops appreciated JVP not elevated RS-clear to auscultation ventilated breath sounds ABD-bowel sounds hypotensive  with mildly distended abdomen  EXT- no edema left AV fistula left femoral hemodialysis catheter   Basic Metabolic Panel: Recent Labs  Lab 04/25/2019 0647  05/12/2019 0713  05/08/2019 1616  05/19/19 0435  05/19/19 1617  05/20/19 0032 05/20/19 0203 05/20/19 0350 05/20/19 0401 05/20/19 0456 05/20/19 0635  NA 136   < > 138   < >  --    < > 138   < > 137   < > 138 137 137 136 137 138  K 3.7   < > 3.2*   < >  --    < > 4.0   < > 4.2   < > 4.3 4.4 4.6 4.6 5.2* 4.6  CL 96*  --  98   < >  --    < > 98   < > 99   < > 107 106 102 103  --  105  CO2 14*  --  17*  --   --   --  19*  --  21*  --   --   --  22  --   --   --   GLUCOSE 283*  --  259*   < >  --    < > 141*   < > 114*   < > 101* 107* 112* 110*  --  121*  BUN 83*  --  83*   < >  --    < > 88*   < > 71*   < > 40* 37*  41* 36*  --  35*  CREATININE 14.39*   < > 13.12*   < >  --    < > 13.21*   < > 10.08*   < > 6.50* 6.50* 6.33* 6.20*  --  5.90*  CALCIUM 9.3  --  8.3*  --   --   --  8.2*  --  8.2*  --   --   --  8.6*  --   --   --   MG  --   --   --   --   --   --  1.6*  --   --   --   --   --  2.1  --   --   --   PHOS  --   --   --   --  4.5  --  5.6*  --  4.4  --   --   --  2.8  --   --   --    < > = values in this interval not displayed.    Liver Function Tests: Recent Labs  Lab 05/19/19 1617 05/20/19 0350  AST  --  86*  ALT  --  43  ALKPHOS  --  87  BILITOT  --  0.4  PROT  --  6.7  ALBUMIN 2.8* 3.1*   No results for input(s): LIPASE, AMYLASE in the last 168 hours. No results for input(s): AMMONIA in the last 168 hours.  CBC: Recent Labs  Lab 04/25/2019 0647  05/20/2019 1007  05/19/19 1210  05/20/19 0203 05/20/19 0350 05/20/19 0401 05/20/19 0456 05/20/19 0635  WBC 11.5*  --  13.3*  --  11.4*  --   --  14.3*  --   --   --   HGB 8.7*   < > 8.8*   < > 7.9*   < > 8.5* 8.3* 8.8* 8.8* 8.8*  HCT 28.4*   < > 27.5*   < > 24.0*   < > 25.0* 26.2* 26.0* 26.0* 26.0*  MCV 98.3  --  94.2  --  92.7  --   --  93.6  --   --   --   PLT 265   --  200  --  235  --   --  219  --   --   --    < > = values in this interval not displayed.    Cardiac Enzymes: No results for input(s): CKTOTAL, CKMB, CKMBINDEX, TROPONINI in the last 168 hours.  BNP: Invalid input(s): POCBNP  CBG: Recent Labs  Lab 05/19/19 0317 05/19/19 0701 05/19/19 0836 05/19/19 1614 05/20/19 0731  GLUCAP 172* 150* 128* 105* 112*    Microbiology: Results for orders placed or performed during the hospital encounter of 05/19/2019  SARS Coronavirus 2 [facility] order, Performed in [facility] hospital lab) Nasopharyngeal Nasopharyngeal Swab     Status: None   Collection Time: 05/07/2019  6:55 AM   Specimen: Nasopharyngeal Swab  Result Value Ref Range Status   SARS Coronavirus 2 NEGATIVE NEGATIVE Final    Comment: (NOTE) If result is NEGATIVE SARS-CoV-2 target nucleic acids are NOT DETECTED. The SARS-CoV-2 RNA is generally detectable in upper and lower  respiratory specimens during the acute phase of infection. The lowest  concentration of SARS-CoV-2 viral copies this assay can detect is 250  copies / mL. A negative result does not preclude SARS-CoV-2 infection  and should not be used as the sole basis for treatment or other  patient management decisions.  A negative result may occur with  improper specimen collection / handling, submission of specimen other  than nasopharyngeal swab, presence of viral mutation(s) within the  areas targeted by this assay, and inadequate number of viral copies  (<250 copies / mL). A negative result must be combined with clinical  observations, patient history, and epidemiological information. If result is POSITIVE SARS-CoV-2 target nucleic acids are DETECTED. The SARS-CoV-2 RNA is generally detectable in upper and lower  respiratory specimens dur ing the acute phase of infection.  Positive  results are indicative of active infection with SARS-CoV-2.  Clinical  correlation with patient history and other diagnostic  information is  necessary to determine patient infection status.  Positive results do  not rule out bacterial infection or co-infection with other viruses. If result is PRESUMPTIVE POSTIVE SARS-CoV-2 nucleic acids MAY BE PRESENT.   A presumptive positive result was obtained on the submitted specimen  and confirmed on repeat testing.  While 2019 novel coronavirus  (SARS-CoV-2) nucleic acids may be present in the submitted sample  additional confirmatory testing may be necessary for epidemiological  and / or clinical management purposes  to differentiate between  SARS-CoV-2 and other Sarbecovirus currently known to infect humans.  If clinically indicated additional testing with an alternate test  methodology [phone]) is advised. The SARS-CoV-2 RNA is generally  detectable in upper and lower respiratory sp ecimens during the acute  phase of infection. The expected result is Negative. Fact Sheet for Patients:  StrictlyIdeas.no Fact Sheet for Healthcare Providers: BankingDealers.co.za This test is not yet approved or cleared by the [place] FDA and has been authorized for detection and/or diagnosis of SARS-CoV-2 by FDA under an Emergency Use Authorization (EUA).  This EUA will remain in effect (meaning this test can be used) for the duration of the COVID-19 declaration under Section 564(b)(1) of the Act, 21 U.S.C. section 360bbb-3(b)(1), unless the authorization is terminated or revoked sooner. Performed at [facility], [place] [address], [place], [place] [address]   MRSA PCR Screening     Status: None   Collection Time: 05/11/2019 10:24 AM   Specimen: Nasal Mucosa; Nasopharyngeal  Result Value Ref Range Status   MRSA by PCR NEGATIVE NEGATIVE Final    Comment:        The GeneXpert MRSA Assay (FDA approved for NASAL specimens only), is one component of a comprehensive MRSA colonization surveillance program. It is not intended to  diagnose MRSA infection nor to guide or monitor treatment for MRSA infections. Performed at [facility], [place] [address] [place], [place] [address]   Culture, respiratory (non-expectorated)     Status: None (Preliminary result)   Collection Time: 05/19/19 12:30 PM   Specimen: Tracheal Aspirate; Respiratory  Result Value Ref Range Status   Specimen Description TRACHEAL ASPIRATE  Final   Special Requests NONE  Final   Gram Stain   Final    NO WBC SEEN RARE GRAM POSITIVE COCCI Performed at [facility], [place] [address] [place], [place] [address]    Culture PENDING  Incomplete   Report Status PENDING  Incomplete    Coagulation Studies: Recent Labs    04/27/2019 0647 05/15/2019 0713 04/22/2019 1430 05/19/19 0448  LABPROT 16.4* 18.7* 17.7* 16.3*  INR 1.3* 1.6* 1.5* 1.3*    Urinalysis: No results for input(s): COLORURINE, LABSPEC, PHURINE, GLUCOSEU, HGBUR, BILIRUBINUR, KETONESUR, PROTEINUR, UROBILINOGEN, NITRITE, LEUKOCYTESUR in the last 72 hours.  Invalid input(s): APPERANCEUR  Imaging: Dg Chest Port 1 View  Result Date: 05/19/2019 CLINICAL DATA:  Acute respiratory failure, post CPR, aortic valvular disease EXAM: PORTABLE CHEST 1 VIEW COMPARISON:  04/28/2019 FINDINGS: Endotracheal tube remains 3.3 cm above the carina. NG tube extends into the stomach. Left IJ central line tip at the innominate venous confluence as before. Previous aortic valve replacement evident. Heart is enlarged with similar vascular congestion and scattered streaky left mid and lower lung atelectasis. Stable right lung aeration. No pneumothorax. No enlarging effusion. IMPRESSION: Stable cardiomegaly, vascular congestion and scattered left lung atelectasis. No significant interval change. Electronically Signed   By: Jerilynn Mages.  Shick M.D.   On: 05/19/2019 08:48   Dg Chest Port 1 View  Result Date: 05/06/2019 CLINICAL DATA:  central line placement EXAM: PORTABLE CHEST 1 VIEW COMPARISON:  Chest radiograph  dated 05/08/2019 FINDINGS: Interval placement of a left central venous catheter with tip overlying the brachiocephalic vein. Esophageal probe tip overlies the mid/upper esophagus. Endotracheal tube tip lies approximately 2 cm above the carina. Nasogastric tube remains in place. Stable enlarged cardiomediastinal contours status post median sternotomy and valve replacement. Scattered left lung linear opacities likely reflect atelectasis. No definite pleural effusion though the left costophrenic angle is excluded from field of view. No pneumothorax. IMPRESSION: Interval placement of left central venous catheter with tip overlying the brachiocephalic vein. Consider advancement into the SVC. Electronically Signed   By: Audie Pinto M.D.   On: 05/13/2019 13:38     Medications:   .  prismasol BGK 4/2.5 500 mL/hr at 05/20/19 0716  .  prismasol BGK 4/2.5 500 mL/hr at 05/19/19 2214  . sodium chloride    . cisatracurium (NIMBEX) infusion Stopped (05/20/19 0058)  . fentaNYL infusion INTRAVENOUS Stopped (05/20/19 0658)  . midazolam Stopped (05/20/19 0623)  . norepinephrine (LEVOPHED) Adult infusion 4 mcg/min (05/20/19 0700)  . prismasol BGK 4/2.5 2,000 mL/hr at 05/20/19 0647  . propofol (DIPRIVAN) infusion Stopped (05/19/19 1233)   . artificial tears  1 application Both Eyes J6E  . aspirin  81 mg Per Tube Daily  . bictegravir-emtricitabine-tenofovir AF  1 tablet Oral Daily  . chlorhexidine gluconate (MEDLINE KIT)  15 mL Mouth Rinse BID  . Chlorhexidine Gluconate Cloth  6 each Topical Daily  . feeding supplement (VITAL HIGH PROTEIN)  1,000 mL Per Tube Q24H  . fentaNYL (SUBLIMAZE) injection  50 mcg Intravenous Once  . heparin  5,000 Units Subcutaneous Q8H  . insulin aspart  1-3 Units Subcutaneous Q4H  . mouth rinse  15 mL Mouth Rinse 10 times per day  . pantoprazole (PROTONIX) IV  40 mg Intravenous QHS  . sodium chloride flush  3 mL Intravenous Q12H  . ticagrelor  90 mg Per Tube BID   sodium  chloride, acetaminophen (TYLENOL) oral liquid 160 mg/5 mL, acetaminophen, albuterol, cisatracurium, fentaNYL, heparin, midazolam, ondansetron (ZOFRAN) IV, sodium chloride, sodium chloride flush  Assessment/ Plan:   End-stage renal disease hemodialysis for many years.  Has a functioning AV fistula.  Appreciate assistance from critical care medicine and placement of left femoral dialysis catheter 05/19/2019.  Patient appears to be stable on CRRT  V. fib cardiac arrest STEMI appreciate assistance Dr. Irish Lack, DES stent to LAD lesion continues on Brilinta and aspirin  Hypertension and shock continues on Levophed  Anemia stable we will continue to follow  Metabolic bone disease we will continue to follow  Hepatitis B  HIV   Biktarvy daily  Seizure disorder  Hypophosphatemia we will add sodium phosphate  Altered mental status -  backing off on sedation today in order to see if mental status improves Jeffrey [name].   LOS: [place] '@TODAY' '@7' :42 AM

## 2019-05-20 NOTE — Progress Notes (Signed)
Pharmacy Antibiotic Note  Jeffrey [name] is a 63 y.o. male admitted on 05/08/2019 with sepsis.  Pharmacy has been consulted for cefepime dosing.  Patient s/p vfib arrest 8/27, he is ESRD prior to admit, currently receiving CRRT therapy, will adjust antibiotics accordingly.   Pharmacy asked to investigate fentanyl allergy. From the records it appears that this allergy information was given by the patient during a visit to the ED for back pain. He has received fentanyl injections on multiple occasions with no ADEs mentions around those administrations. Continue fentanyl is being weaned off today, will follow closely if neuro function improves with less sedation. Difficult situation and unable to investigate further until if/when patient's mental status improves.   Plan: Cefepime 2g q12 hours while on CRRT  Height: 6' (182.9 cm) Weight: 194 lb 10.7 oz (88.3 kg) IBW/kg (Calculated) : 77.6  Temp (24hrs), Avg:95.2 F (35.1 C), Min:91 F (32.8 C), Max:98.8 F (37.1 C)  Recent Labs  Lab 04/28/2019 0647 05/17/2019 0648  05/06/2019 1007  05/19/19 1210  05/20/19 0032 05/20/19 0203 05/20/19 0350 05/20/19 0401 05/20/19 0635  WBC 11.5*  --   --  13.3*  --  11.4*  --   --   --  14.3*  --   --   CREATININE 14.39* 14.20*   < >  --    < >  --    < > 6.50* 6.50* 6.33* 6.20* 5.90*  LATICACIDVEN  --  6.6*  --   --   --   --   --   --   --   --   --   --    < > = values in this interval not displayed.    Estimated Creatinine Clearance: 13.7 mL/min (A) (by C-G formula based on SCr of 5.9 mg/dL (H)).    Allergies  Allergen Reactions  . Lisinopril Anaphylaxis and Shortness Of Breath    Throat swelling  . Penicillins Anaphylaxis and Other (See Comments)    Childhood allergy Has patient had a PCN reaction causing immediate rash, facial/tongue/throat swelling, SOB or lightheadedness with hypotension: Yes Has patient had a PCN reaction causing severe rash involving mucus membranes or skin necrosis: Unk Has  patient had a PCN reaction that required hospitalization: Unk Has patient had a PCN reaction occurring within the last 10 years: No If all of the above answers are "NO", then may proceed with Cephalosporin use.   . Fentanyl Other (See Comments)    Lethargy, AMS  . Morphine And Related Other (See Comments)    "Not Himself"    Thank you for allowing pharmacy to be a part of this patient's care.  Erin Hearing PharmD., BCPS Clinical Pharmacist 05/20/2019 11:58 AM

## 2019-05-20 NOTE — Progress Notes (Signed)
NAME:  Jeffrey [name], MRN:  [id number], DOB:  19-May-1956, LOS: 2 ADMISSION DATE:  05/19/2019, CONSULTATION DATE:  8/27REFERRING MD:  cardama, CHIEF COMPLAINT:  Cardiac arrest   Brief History   63 year old male patient with end-stage renal disease on home dialysis admitted 8/27 status post VF arrest estimated time to ROSC 35 minutes.  Past Medical History  Prior aortic valve insufficiency, status post aortic valve endocarditis requiring aortic valve replacement 2019 Known diffuse nonobstructive coronary artery disease HIV Hepatitis B End-stage renal disease on home dialysis Hypothyroidism Peripheral vascular disease Prior GI bleed Coronary artery disease Prior left VATS for fibrothorax following bacteremia in February 2020  [facility]   8/27: Admitted status post VF arrest.  Estimated downtime 35 minutes.  Went for left heart cath, findings:Mid LAD lesion 100% stenosis, drug-eluting stent was successfully placed.  A distal/apical LAD lesion was stenosed at 80% and this was treated with PTCA.  He had nonobstructive disease involving the circumflex and RCA.  Hypothermia protocol initiated at: 1104 am temp goal reached    8/28 start re-warming at 11 am. Still pressor dependent. Left IJ site oozing required extra suture. EEg neg for seizure. Amiodarone stopped d/t bradycardia. Sending sputum and PCTs. Placing HD cath for CRRT. amio stopped overnight. Hypotensive   Consults:  Cardiology   Procedures:  oett 8/27>>> Left IJ CVL 8/27>>> Aline 8/28 Right IJ HD cath 8/28>>>  Significant Diagnostic Tests:  Left heart cath 8/28: Mid LAD lesion 100% stenosis, drug-eluting stent was successfully placed.  A distal/apical LAD lesion was stenosed at 80% and this was treated with PTCA.  He had nonobstructive disease involving the circumflex and RCA. EEG 8/27>>>no seizure    Micro Data:  Corona virus 8/27: neg MRSA PCR 8/.27 - neg Respiratory culture 8/28>>>  Antimicrobials:   Cefepime 8/29 >.  Interim history/subjective:   05/20/2019 -> off levophed. On vent 40% fi02. Rewarmed at 3.30 am. Off sedation. Has cough and opened eyes but not following comands  Objective   Blood pressure 104/74, pulse (!) 101, temperature (!) 97.2 F (36.2 C), temperature source Esophageal, resp. rate 15, height 6' (1.829 m), weight 88.3 kg, SpO2 93 %. CVP:  [3 mmHg-10 mmHg] 7 mmHg  Vent Mode: PRVC FiO2 (%):  [40 %] 40 % Set Rate:  [14 bmp] 14 bmp Vt Set:  [620 mL] 620 mL PEEP:  [5 cmH20] 5 cmH20 Plateau Pressure:  [15 cmH20-22 cmH20] 15 cmH20   Intake/Output Summary (Last 24 hours) at 05/20/2019 1059 Last data filed at 05/20/2019 1000 Gross per 24 hour  Intake 2716.47 ml  Output 2405 ml  Net 311.47 ml   Filed Weights   04/29/2019 0650 05/19/19 0500 05/20/19 0435  Weight: 87 kg 89.9 kg 88.3 kg    Examination:  General Appearance:  Looks criticall ill Head:  Normocephalic, without obvious abnormality, atraumatic Eyes:  PERRL - yes, conjunctiva/corneas - muddy     Ears:  Normal external ear canals, both ears Nose:  G tube - no Throat:  ETT TUBE - yes , OG tube - yes Neck:  Supple,  No enlargement/tenderness/nodules Lungs: Clear to auscultation bilaterally, Ventilator   Synchrony - yes Heart:  S1 and S2 normal, no murmur, CVP - x.  Pressors - no Abdomen:  Soft, no masses, no organomegaly Genitalia / Rectal:  Not done Extremities:  Extremities- intact Skin:  ntact in exposed areas . Sacral area - no decub Neurologic:  Sedation - none -> RASS - -4  Resolved Hospital Problem list     Assessment & Plan:   VF arrest in setting of severe CAD, now s/p DES to mid LAD and PTCA to distal/apical LAD lesion -Known history of coronary artery disease with prior CABG and aortic valve replacement  05/20/2019 - cards followng, Amiodarone stopped d/t bradycardi  Plan Cont tele  Cont uninterrupted dual platelet therapy for at least 12 months  Further recs per cards    Circulatory shock.  New CM w/ EF 45-50% and mod dyskinesis. Not sure if shock state is all cardiogenic, also consider hypothermia affect or also sepsis  05/20/2019 - off pressors this AM. PCT 20 and c.w sepsis nos  Plan Cont to titrate noreepi for MAP >70 Cont tele    Acute respiratory failure s/p cardiopulm arrest.   05/20/2019 - does not meet SBT criteria  Plan Cont full vent support - PRVC VAP bundel   Acute metabolic encephalopathy: At risk for anoxic injury ->EEG neg for seizure   05/20/2019 - rewarmed and just offf sedation. Stil comatose  Plan chagne to prn sedation Aim CT head 05/21/19   End-stage renal disease w/ residual anion gap metabolic acidosis (lactic acid was 6.6-->suspect just clearing slower).  - on home dialysis  05/20/2019 - on CRRT  Plan CRRT to continue    Intermittent Fluid and electrolyte imbalance Plan Monitor and replace  Anemia of chronic disease Plan - PRBC for hgb </= 6.9gm%    - exceptions are   -  if ACS susepcted/confirmed then transfuse for hgb </= 8.0gm%,  or    -  active bleeding with hemodynamic instability, then transfuse regardless of hemoglobin value   At at all times try to transfuse 1 unit prbc as possible with exception of active hemorrhage   KNown HIV and HepB Currently Likely sepsis (PCT > 20) - MRSA PCR neg  Plan  - continue baseline biktarvy   - start cefepime    Hyperglycemia Plan Hyperglycemia protocol     Best practice:  Diet: NPO Pain/Anxiety/Delirium protocol (if indicated): hypothermia protocol 8/27 VAP protocol (if indicated): 8/27 DVT prophylaxis: [place] heparin  GI prophylaxis: PPI Glucose control: 8/27 Mobility: BR Code Status: full code Family Communication:  On 05/20/2019   - brother at bedside - denied any questions Disposition: ICU     ATTESTATION & SIGNATURE   The patient Jeffrey [name] is critically ill with multiple organ systems failure and requires high complexity decision  making for assessment and support, frequent evaluation and titration of therapies, application of advanced monitoring technologies and extensive interpretation of multiple databases.   Critical Care Time devoted to patient care services described in this note is  30  Minutes. This time reflects time of care of this signee Dr Brand Males. This critical care time does not reflect procedure time, or teaching time or supervisory time of PA/NP/Med student/Med Resident etc but could involve care discussion time     Dr. Brand Males, M.D., [facility].C.P Pulmonary and Critical Care Medicine Staff Physician [facility] Pager: [phone], If no answer or between  15:00h - 7:00h: call [phone]  05/20/2019 10:59 AM    LABS    PULMONARY Recent Labs  Lab 04/29/2019 3532 05/04/2019 0805  05/02/2019 1629  05/19/19 0335  05/20/19 0032 05/20/19 0203 05/20/19 0401 05/20/19 0456 05/20/19 0635  PHART 7.185* 7.275*  --  7.477*  --  7.452*  --   --   --   --  7.387  --   PCO2ART 41.1 42.1  --  26.0*  --  26.6*  --   --   --   --  36.9  --   PO2ART 55.0* 485.0*  --  199.0*  --  174*  --   --   --   --  114.0*  --   HCO3 15.5* 19.6*  --  20.0  --  19.3*  --   --   --   --  22.2  --   TCO2 17* 21*   < > 21*   < >  --    < > 24 22 23 23  21*  O2SAT 80.0 100.0  --  100.0  --  97.4  --   --   --   --  98.0  --    < > = values in this interval not displayed.    CBC Recent Labs  Lab 04/22/2019 1007  05/19/19 1210  05/20/19 0350 05/20/19 0401 05/20/19 0456 05/20/19 0635  HGB 8.8*   < > 7.9*   < > 8.3* 8.8* 8.8* 8.8*  HCT 27.5*   < > 24.0*   < > 26.2* 26.0* 26.0* 26.0*  WBC 13.3*  --  11.4*  --  14.3*  --   --   --   PLT 200  --  235  --  219  --   --   --    < > = values in this interval not displayed.    COAGULATION Recent Labs  Lab 04/29/2019 0647 04/24/2019 0713 05/17/2019 1430 05/19/19 0448  INR 1.3* 1.6* 1.5* 1.3*    CARDIAC  No results  for input(s): TROPONINI in the last 168 hours. No results for input(s): PROBNP in the last 168 hours.   CHEMISTRY Recent Labs  Lab 04/23/2019 6789  05/16/2019 0713  05/08/2019 1616  05/19/19 0435  05/19/19 1617  05/20/19 0032 05/20/19 0203 05/20/19 0350 05/20/19 0401 05/20/19 0456 05/20/19 0635  NA 136   < > 138   < >  --    < > 138   < > 137   < > 138 137 137 136 137 138  K 3.7   < > 3.2*   < >  --    < > 4.0   < > 4.2   < > 4.3 4.4 4.6 4.6 5.2* 4.6  CL 96*  --  98   < >  --    < > 98   < > 99   < > 107 106 102 103  --  105  CO2 14*  --  17*  --   --   --  19*  --  21*  --   --   --  22  --   --   --   GLUCOSE 283*  --  259*   < >  --    < > 141*   < > 114*   < > 101* 107* 112* 110*  --  121*  BUN 83*  --  83*   < >  --    < > 88*   < > 71*   < > 40* 37* 41* 36*  --  35*  CREATININE 14.39*   < > 13.12*   < >  --    < > 13.21*   < > 10.08*   < > 6.50* 6.50* 6.33* 6.20*  --  5.90*  CALCIUM 9.3  --  8.3*  --   --   --  8.2*  --  8.2*  --   --   --  8.6*  --   --   --   MG  --   --   --   --   --   --  1.6*  --   --   --   --   --  2.1  --   --   --   PHOS  --   --   --   --  4.5  --  5.6*  --  4.4  --   --   --  2.8  --   --   --    < > = values in this interval not displayed.   Estimated Creatinine Clearance: 13.7 mL/min (A) (by C-G formula based on SCr of 5.9 mg/dL (H)).   LIVER Recent Labs  Lab 04/29/2019 0647 05/01/2019 0713 05/06/2019 1430 05/19/19 0448 05/19/19 1617 05/20/19 0350  AST  --   --   --   --   --  86*  ALT  --   --   --   --   --  43  ALKPHOS  --   --   --   --   --  87  BILITOT  --   --   --   --   --  0.4  PROT  --   --   --   --   --  6.7  ALBUMIN  --   --   --   --  2.8* 3.1*  INR 1.3* 1.6* 1.5* 1.3*  --   --      INFECTIOUS Recent Labs  Lab 05/08/2019 0648 05/19/19 1210  LATICACIDVEN 6.6*  --   PROCALCITON  --  20.59     ENDOCRINE CBG (last 3)  Recent Labs    05/19/19 0836 05/19/19 1614 05/20/19 0731  GLUCAP 128* 105* 112*          IMAGING x48h  - image(s) personally visualized  -   highlighted in bold Dg Chest Port 1 View  Result Date: 05/20/2019 CLINICAL DATA:  Acute respiratory failure, status post CPR. EXAM: PORTABLE CHEST 1 VIEW COMPARISON:  1 day prior FINDINGS: Endotracheal tube terminates 3.5 cm above carina. Nasogastric tube extends beyond the inferior aspect of the film. Left internal jugular line tip at high SVC. Aortic valve repair. Midline trachea. Mild cardiomegaly. Left costophrenic angle excluded. No pleural effusion or pneumothorax. Improved interstitial edema with mild pulmonary venous congestion remaining. Areas of subsegmental atelectasis are improved. IMPRESSION: Improved aeration with decreased interstitial edema. Mild pulmonary venous congestion remains. Electronically Signed   By: Abigail Miyamoto M.D.   On: 05/20/2019 08:57   Dg Chest Port 1 View  Result Date: 05/19/2019 CLINICAL DATA:  Acute respiratory failure, post CPR, aortic valvular disease EXAM: PORTABLE CHEST 1 VIEW COMPARISON:  04/27/2019 FINDINGS: Endotracheal tube remains 3.3 cm above the carina. NG tube extends into the stomach. Left IJ central line tip at the innominate venous confluence as before. Previous aortic valve replacement evident. Heart is enlarged with similar vascular congestion and scattered streaky left mid and lower lung atelectasis. Stable right lung aeration. No pneumothorax. No enlarging effusion. IMPRESSION: Stable cardiomegaly, vascular congestion and scattered left lung atelectasis. No significant interval change. Electronically Signed   By: Jerilynn Mages.  Shick M.D.   On: 05/19/2019 08:48   Dg Chest Port 1 View  Result Date: 05/07/2019 CLINICAL DATA:  central line placement EXAM: PORTABLE CHEST 1 VIEW COMPARISON:  Chest radiograph dated 04/26/2019 FINDINGS: Interval placement of a left central venous catheter with tip overlying the brachiocephalic vein. Esophageal probe tip overlies the mid/upper esophagus. Endotracheal tube tip lies  approximately 2 cm above the carina. Nasogastric tube remains in place. Stable enlarged cardiomediastinal contours status post median sternotomy and valve replacement. Scattered left lung linear opacities likely reflect atelectasis. No definite pleural effusion though the left costophrenic angle is excluded from field of view. No pneumothorax. IMPRESSION: Interval placement of left central venous catheter with tip overlying the brachiocephalic vein. Consider advancement into the SVC. Electronically Signed   By: Audie Pinto M.D.   On: 05/14/2019 13:38

## 2019-05-21 ENCOUNTER — Other Ambulatory Visit: Payer: Self-pay

## 2019-05-21 ENCOUNTER — Inpatient Hospital Stay (HOSPITAL_COMMUNITY): Payer: Medicare Other

## 2019-05-21 ENCOUNTER — Encounter (HOSPITAL_COMMUNITY): Payer: Self-pay

## 2019-05-21 LAB — TROPONIN I (HIGH SENSITIVITY)
Troponin I (High Sensitivity): >27000 ng/L (ref <18)
Troponin I (High Sensitivity): >27000 ng/L (ref <18)
Troponin I (High Sensitivity): 26847 ng/L (ref <18)

## 2019-05-21 LAB — GLUCOSE, CAPILLARY
Glucose-Capillary: 104 mg/dL — ABNORMAL HIGH (ref 70–99)
Glucose-Capillary: 112 mg/dL — ABNORMAL HIGH (ref 70–99)
Glucose-Capillary: 112 mg/dL — ABNORMAL HIGH (ref 70–99)
Glucose-Capillary: 121 mg/dL — ABNORMAL HIGH (ref 70–99)
Glucose-Capillary: 141 mg/dL — ABNORMAL HIGH (ref 70–99)
Glucose-Capillary: 97 mg/dL (ref 70–99)
Glucose-Capillary: 99 mg/dL (ref 70–99)

## 2019-05-21 LAB — RENAL FUNCTION PANEL
Albumin: 2.6 g/dL — ABNORMAL LOW (ref 3.5–5.0)
Albumin: 2.4 g/dL — ABNORMAL LOW (ref 3.5–5.0)
Anion gap: 13 (ref 5–15)
Anion gap: 10 (ref 5–15)
BUN: 23 mg/dL (ref 8–23)
BUN: 16 mg/dL (ref 8–23)
CO2: 26 mmol/L (ref 22–32)
CO2: 25 mmol/L (ref 22–32)
Calcium: 8.5 mg/dL — ABNORMAL LOW (ref 8.9–10.3)
Calcium: 8.3 mg/dL — ABNORMAL LOW (ref 8.9–10.3)
Chloride: 99 mmol/L (ref 98–111)
Chloride: 103 mmol/L (ref 98–111)
Creatinine, Ser: 3.2 mg/dL — ABNORMAL HIGH (ref 0.61–1.24)
Creatinine, Ser: 2.42 mg/dL — ABNORMAL HIGH (ref 0.61–1.24)
GFR calc Af Amer: 22 mL/min — ABNORMAL LOW (ref >60)
GFR calc Af Amer: 31 mL/min — ABNORMAL LOW (ref >60)
GFR calc non Af Amer: 19 mL/min — ABNORMAL LOW (ref >60)
GFR calc non Af Amer: 27 mL/min — ABNORMAL LOW (ref >60)
Glucose, Bld: 110 mg/dL — ABNORMAL HIGH (ref 70–99)
Glucose, Bld: 101 mg/dL — ABNORMAL HIGH (ref 70–99)
Phosphorus: 2.3 mg/dL — ABNORMAL LOW (ref 2.5–4.6)
Phosphorus: 2.3 mg/dL — ABNORMAL LOW (ref 2.5–4.6)
Potassium: 4.2 mmol/L (ref 3.5–5.1)
Potassium: 4.1 mmol/L (ref 3.5–5.1)
Sodium: 138 mmol/L (ref 135–145)
Sodium: 138 mmol/L (ref 135–145)

## 2019-05-21 LAB — HEPARIN LEVEL (UNFRACTIONATED)
Heparin Unfractionated: 0.62 IU/mL (ref 0.30–0.70)
Heparin Unfractionated: 0.42 IU/mL (ref 0.30–0.70)

## 2019-05-21 LAB — POCT I-STAT 7, (LYTES, BLD GAS, ICA,H+H)
Acid-Base Excess: 4 mmol/L — ABNORMAL HIGH (ref 0.0–2.0)
Bicarbonate: 27.5 mmol/L (ref 20.0–28.0)
Calcium, Ion: 1.14 mmol/L — ABNORMAL LOW (ref 1.15–1.40)
HCT: 22 % — ABNORMAL LOW (ref 39.0–52.0)
Hemoglobin: 7.5 g/dL — ABNORMAL LOW (ref 13.0–17.0)
O2 Saturation: 99 %
Patient temperature: 36.7
Potassium: 4.4 mmol/L (ref 3.5–5.1)
Sodium: 137 mmol/L (ref 135–145)
TCO2: 29 mmol/L (ref 22–32)
pCO2 arterial: 37 mmHg (ref 32.0–48.0)
pH, Arterial: 7.478 — ABNORMAL HIGH (ref 7.350–7.450)
pO2, Arterial: 108 mmHg (ref 83.0–108.0)

## 2019-05-21 LAB — POCT I-STAT, CHEM 8
BUN: 25 mg/dL — ABNORMAL HIGH (ref 8–23)
Calcium, Ion: 1.11 mmol/L — ABNORMAL LOW (ref 1.15–1.40)
Chloride: 103 mmol/L (ref 98–111)
Creatinine, Ser: 3.3 mg/dL — ABNORMAL HIGH (ref 0.61–1.24)
Glucose, Bld: 132 mg/dL — ABNORMAL HIGH (ref 70–99)
HCT: 21 % — ABNORMAL LOW (ref 39.0–52.0)
Hemoglobin: 7.1 g/dL — ABNORMAL LOW (ref 13.0–17.0)
Potassium: 4.2 mmol/L (ref 3.5–5.1)
Sodium: 137 mmol/L (ref 135–145)
TCO2: 25 mmol/L (ref 22–32)

## 2019-05-21 LAB — CBC
HCT: 24.3 % — ABNORMAL LOW (ref 39.0–52.0)
Hemoglobin: 7.4 g/dL — ABNORMAL LOW (ref 13.0–17.0)
MCH: 29.5 pg (ref 26.0–34.0)
MCHC: 30.5 g/dL (ref 30.0–36.0)
MCV: 96.8 fL (ref 80.0–100.0)
Platelets: 183 10*3/uL (ref 150–400)
RBC: 2.51 MIL/uL — ABNORMAL LOW (ref 4.22–5.81)
RDW: 16.8 % — ABNORMAL HIGH (ref 11.5–15.5)
WBC: 12.1 10*3/uL — ABNORMAL HIGH (ref 4.0–10.5)
nRBC: 0 % (ref 0.0–0.2)

## 2019-05-21 LAB — MAGNESIUM: Magnesium: 2.8 mg/dL — ABNORMAL HIGH (ref 1.7–2.4)

## 2019-05-21 MED ORDER — METOPROLOL TARTRATE 5 MG/5ML IV SOLN
2.5000 mg | Freq: Four times a day (QID) | INTRAVENOUS | Status: DC
  Administered 2019-05-21 – 2019-05-23 (×9): 2.5 mg via INTRAVENOUS
  Filled 2019-05-21 (×9): qty 5

## 2019-05-21 MED ORDER — HEPARIN (PORCINE) 25000 UT/250ML-% IV SOLN
1300.0000 [IU]/h | INTRAVENOUS | Status: DC
  Administered 2019-05-21 (×3): 1200 [IU]/h via INTRAVENOUS
  Filled 2019-05-21 (×2): qty 250

## 2019-05-21 MED ORDER — NOREPINEPHRINE 16 MG/250ML-% IV SOLN: 0.0000 ug/min | INTRAVENOUS | Status: DC

## 2019-05-21 MED ORDER — NOREPINEPHRINE 4 MG/250ML-% IV SOLN: 0.0000 ug/min | INTRAVENOUS | Status: DC

## 2019-05-21 NOTE — Progress Notes (Signed)
[facility] ROUNDING NOTE   Subjective:   This is a 63 year old gentleman end-stage renal disease on home hemodialysis history of hepatitis B and HIV and seizure disorders history of aortic valve replacement 07/2018.  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  He was admitted with a V. fib arrest found to have STEMI.  EMS was called and performed 35 minutes CPR with 5 rounds of epinephrine 1 calcium 1 bicarbonate and 450 mg of amiodarone.  He was taken to the Cath Lab and found to have a mid LAD occlusion status post Synergy DES with 80% distal LAD status post balloon angioplasty appreciate assistance of Dr. Irish Lack.  Blood pressure 97/63 pulse 71 temperature 98.4 O2 sats 90% FiO2 40%  Sodium 137 potassium 4.4 chloride 99 CO2 26 BUN 23 creatinine 3.2 glucose 110 calcium 8.5 phosphorus 2.3 magnesium 2.8 albumin 2.6 troponin I greater than 27,000 WBC 12.1 hemoglobin 7.5 platelets 183  Biktarvy 1 daily, Protonix 40 mg daily, Brilinta 90 mg daily, aspirin 81 mg daily  Levophed IV Heparin IV    Objective:  Vital signs in last 24 hours:  Temp:  [97.5 F (36.4 C)-99.9 F (37.7 C)] 98.4 F (36.9 C) (08/30 0615) Pulse Rate:  [50-277] 91 (08/30 0700) Resp:  [14-30] 14 (08/30 0700) BP: (66-163)/(41-128) 107/90 (08/30 0645) SpO2:  [91 %-100 %] 100 % (08/30 0700) FiO2 (%):  [40 %] 40 % (08/30 0615)  Weight change:  Filed Weights   04/22/2019 0650 05/19/19 0500 05/20/19 0435  Weight: 87 kg 89.9 kg 88.3 kg    Intake/Output: I/O last 3 completed shifts: In: 2810.6 [I.V.:812.9; NG/GT:1705; IV Piggyback:292.7] Out: 4766 [Emesis/NG output:1900; WUJWJ:1914; Stool:200]   Intake/Output this shift:  No intake/output data recorded. Ill-appearing intubated sedated OG tube cooling pads CVS-regular rate and rhythm no murmurs rubs gallops appreciated JVP not elevated RS-clear to auscultation ventilated breath sounds ABD-bowel sounds  hypotensive with mildly distended abdomen  EXT- no edema left AV fistula left femoral hemodialysis catheter   Basic Metabolic Panel: Recent Labs  Lab 05/19/19 0435  05/19/19 1617  05/20/19 0350 05/20/19 0401  05/20/19 7829 05/20/19 1436 05/20/19 2343 05/21/19 0326 05/21/19 0507  NA 138   < > 137   < > 137 136   < > 138 137 137 138 137  K 4.0   < > 4.2   < > 4.6 4.6   < > 4.6 4.5 4.2 4.2 4.4  CL 98   < > 99   < > 102 103  --  105 102 103 99  --   CO2 19*  --  21*  --  22  --   --   --  23  --  26  --   GLUCOSE 141*   < > 114*   < > 112* 110*  --  121* 154* 132* 110*  --   BUN 88*   < > 71*   < > 41* 36*  --  35* 32* 25* 23  --   CREATININE 13.21*   < > 10.08*   < > 6.33* 6.20*  --  5.90* 4.48* 3.30* 3.20*  --   CALCIUM 8.2*  --  8.2*  --  8.6*  --   --   --  8.4*  --  8.5*  --   MG 1.6*  --   --   --  2.1  --   --   --   --   --  2.8*  --   PHOS 5.6*  --  4.4  --  2.8  --   --   --  2.4*  --  2.3*  --    < > = values in this interval not displayed.    Liver Function Tests: Recent Labs  Lab 05/19/19 1617 05/20/19 0350 05/20/19 1436 05/21/19 0326  AST  --  86*  --   --   ALT  --  43  --   --   ALKPHOS  --  87  --   --   BILITOT  --  0.4  --   --   PROT  --  6.7  --   --   ALBUMIN 2.8* 3.1* 2.7* 2.6*   No results for input(s): LIPASE, AMYLASE in the last 168 hours. No results for input(s): AMMONIA in the last 168 hours.  CBC: Recent Labs  Lab 04/29/2019 0647  05/04/2019 1007  05/19/19 1210  05/20/19 0350  05/20/19 0456 05/20/19 0635 05/20/19 2343 05/21/19 0326 05/21/19 0507  WBC 11.5*  --  13.3*  --  11.4*  --  14.3*  --   --   --   --  12.1*  --   HGB 8.7*   < > 8.8*   < > 7.9*   < > 8.3*   < > 8.8* 8.8* 7.1* 7.4* 7.5*  HCT 28.4*   < > 27.5*   < > 24.0*   < > 26.2*   < > 26.0* 26.0* 21.0* 24.3* 22.0*  MCV 98.3  --  94.2  --  92.7  --  93.6  --   --   --   --  96.8  --   PLT 265  --  200  --  235  --  219  --   --   --   --  183  --    < > = values in this  interval not displayed.    Cardiac Enzymes: No results for input(s): CKTOTAL, CKMB, CKMBINDEX, TROPONINI in the last 168 hours.  BNP: Invalid input(s): POCBNP  CBG: Recent Labs  Lab 05/20/19 0731 05/20/19 1142 05/20/19 1553 05/20/19 2012 05/21/19 0335  GLUCAP 112* 149* 77 141* 112*    Microbiology: Results for orders placed or performed during the hospital encounter of 05/12/2019  SARS Coronavirus 2 [facility] order, Performed in [facility] hospital lab) Nasopharyngeal Nasopharyngeal Swab     Status: None   Collection Time: 04/27/2019  6:55 AM   Specimen: Nasopharyngeal Swab  Result Value Ref Range Status   SARS Coronavirus 2 NEGATIVE NEGATIVE Final    Comment: (NOTE) If result is NEGATIVE SARS-CoV-2 target nucleic acids are NOT DETECTED. The SARS-CoV-2 RNA is generally detectable in upper and lower  respiratory specimens during the acute phase of infection. The lowest  concentration of SARS-CoV-2 viral copies this assay can detect is 250  copies / mL. A negative result does not preclude SARS-CoV-2 infection  and should not be used as the sole basis for treatment or other  patient management decisions.  A negative result may occur with  improper specimen collection / handling, submission of specimen other  than nasopharyngeal swab, presence of viral mutation(s) within the  areas targeted by this assay, and inadequate number of viral copies  (<250 copies / mL). A negative result must be combined with clinical  observations, patient history, and epidemiological information. If result is POSITIVE SARS-CoV-2 target nucleic acids are DETECTED. The SARS-CoV-2 RNA is generally  detectable in upper and lower  respiratory specimens dur ing the acute phase of infection.  Positive  results are indicative of active infection with SARS-CoV-2.  Clinical  correlation with patient history and other diagnostic information is  necessary to determine patient infection status.  Positive  results do  not rule out bacterial infection or co-infection with other viruses. If result is PRESUMPTIVE POSTIVE SARS-CoV-2 nucleic acids MAY BE PRESENT.   A presumptive positive result was obtained on the submitted specimen  and confirmed on repeat testing.  While 2019 novel coronavirus  (SARS-CoV-2) nucleic acids may be present in the submitted sample  additional confirmatory testing may be necessary for epidemiological  and / or clinical management purposes  to differentiate between  SARS-CoV-2 and other Sarbecovirus currently known to infect humans.  If clinically indicated additional testing with an alternate test  methodology [phone]) is advised. The SARS-CoV-2 RNA is generally  detectable in upper and lower respiratory sp ecimens during the acute  phase of infection. The expected result is Negative. Fact Sheet for Patients:  StrictlyIdeas.no Fact Sheet for Healthcare Providers: BankingDealers.co.za This test is not yet approved or cleared by the [place] FDA and has been authorized for detection and/or diagnosis of SARS-CoV-2 by FDA under an Emergency Use Authorization (EUA).  This EUA will remain in effect (meaning this test can be used) for the duration of the COVID-19 declaration under Section 564(b)(1) of the Act, 21 U.S.C. section 360bbb-3(b)(1), unless the authorization is terminated or revoked sooner. Performed at [facility], [place] [address], [place], [place] [address]   MRSA PCR Screening     Status: None   Collection Time: 05/09/2019 10:24 AM   Specimen: Nasal Mucosa; Nasopharyngeal  Result Value Ref Range Status   MRSA by PCR NEGATIVE NEGATIVE Final    Comment:        The GeneXpert MRSA Assay (FDA approved for NASAL specimens only), is one component of a comprehensive MRSA colonization surveillance program. It is not intended to diagnose MRSA infection nor to guide or monitor treatment for MRSA  infections. Performed at [facility], [place] [address] [place], [place] [address]   Culture, respiratory (non-expectorated)     Status: None (Preliminary result)   Collection Time: 05/19/19 12:30 PM   Specimen: Tracheal Aspirate; Respiratory  Result Value Ref Range Status   Specimen Description TRACHEAL ASPIRATE  Final   Special Requests NONE  Final   Gram Stain NO WBC SEEN RARE GRAM POSITIVE COCCI   Final   Culture   Final    MODERATE PSEUDOMONAS AERUGINOSA SUSCEPTIBILITIES TO FOLLOW Performed at [facility], [place] [address], [place], [place] [address]    Report Status PENDING  Incomplete    Coagulation Studies: Recent Labs    04/26/2019 1430 05/19/19 0448  LABPROT 17.7* 16.3*  INR 1.5* 1.3*    Urinalysis: No results for input(s): COLORURINE, LABSPEC, PHURINE, GLUCOSEU, HGBUR, BILIRUBINUR, KETONESUR, PROTEINUR, UROBILINOGEN, NITRITE, LEUKOCYTESUR in the last 72 hours.  Invalid input(s): APPERANCEUR    Imaging: Dg Chest Port 1 View  Result Date: 05/20/2019 CLINICAL DATA:  Acute respiratory failure, status post CPR. EXAM: PORTABLE CHEST 1 VIEW COMPARISON:  1 day prior FINDINGS: Endotracheal tube terminates 3.5 cm above carina. Nasogastric tube extends beyond the inferior aspect of the film. Left internal jugular line tip at high SVC. Aortic valve repair. Midline trachea. Mild cardiomegaly. Left costophrenic angle excluded. No pleural effusion or pneumothorax. Improved interstitial edema with mild pulmonary venous congestion  remaining. Areas of subsegmental atelectasis are improved. IMPRESSION: Improved aeration with decreased interstitial edema. Mild pulmonary venous congestion remains. Electronically Signed   By: Abigail Miyamoto M.D.   On: 05/20/2019 08:57     Medications:   .  prismasol BGK 4/2.5 500 mL/hr at 05/21/19 0516  .  prismasol BGK 4/2.5 500 mL/hr at 05/21/19 0516  . sodium chloride 10 mL/hr at 05/21/19 0700  . ceFEPime (MAXIPIME) IV Stopped (05/20/19  2147)  . heparin 1,200 Units/hr (05/21/19 0700)  . norepinephrine (LEVOPHED) Adult infusion Stopped (05/21/19 0712)  . prismasol BGK 4/2.5 2,000 mL/hr at 05/21/19 0524   . artificial tears  1 application Both Eyes R2U  . aspirin  81 mg Per Tube Daily  . bictegravir-emtricitabine-tenofovir AF  1 tablet Oral Daily  . chlorhexidine gluconate (MEDLINE KIT)  15 mL Mouth Rinse BID  . Chlorhexidine Gluconate Cloth  6 each Topical Daily  . feeding supplement (VITAL HIGH PROTEIN)  1,000 mL Per Tube Q24H  . insulin aspart  1-3 Units Subcutaneous Q4H  . mouth rinse  15 mL Mouth Rinse 10 times per day  . metoprolol tartrate  2.5 mg Intravenous Q6H  . pantoprazole sodium  40 mg Per Tube QHS  . sodium chloride flush  3 mL Intravenous Q12H  . ticagrelor  90 mg Per Tube BID   sodium chloride, acetaminophen (TYLENOL) oral liquid 160 mg/5 mL, acetaminophen, albuterol, fentaNYL (SUBLIMAZE) injection, heparin, midazolam PF, ondansetron (ZOFRAN) IV, sodium chloride, sodium chloride flush  Assessment/ Plan:   End-stage renal disease hemodialysis for many years.  Has a functioning AV fistula.  Appreciate assistance from critical care medicine and placement of left femoral dialysis catheter 05/19/2019.  Patient appears to be stable on CRRT.  No changes made on CRRT  continues to run even no anticoagulation but receiving systemic heparin  V. fib cardiac arrest STEMI appreciate assistance Dr. Irish Lack, DES stent to LAD lesion continues on Brilinta and aspirin  Hypertension and shock continues on Levophed  Anemia stable we will continue to follow  Metabolic bone disease we will continue to follow  Hepatitis B  HIV   Biktarvy daily  Seizure disorder  Hypophosphatemia we will add sodium phosphate  Altered mental status -   backing off on sedation today in order to see if mental status improves Jeffrey [name].   LOS: [place] _0 _1 :06 AM

## 2019-05-21 NOTE — Progress Notes (Signed)
EEG complete - results pending 

## 2019-05-21 NOTE — Progress Notes (Addendum)
ANTICOAGULATION CONSULT NOTE - Initial Consult  Pharmacy Consult for Heparin  Indication: chest pain/ACS  Allergies  Allergen Reactions  . Lisinopril Anaphylaxis and Shortness Of Breath    Throat swelling  . Penicillins Anaphylaxis and Other (See Comments)    Childhood allergy Has patient had a PCN reaction causing immediate rash, facial/tongue/throat swelling, SOB or lightheadedness with hypotension: Yes Has patient had a PCN reaction causing severe rash involving mucus membranes or skin necrosis: Unk Has patient had a PCN reaction that required hospitalization: Unk Has patient had a PCN reaction occurring within the last 10 years: No If all of the above answers are "NO", then may proceed with Cephalosporin use.   . Fentanyl Other (See Comments)    Lethargy, AMS  . Morphine And Related Other (See Comments)    "Not Himself"    Patient Measurements: Height: 6' (182.9 cm) Weight: 194 lb 10.7 oz (88.3 kg) IBW/kg (Calculated) : 77.6  Vital Signs: Temp: 99.9 F (37.7 C) (08/30 0200) Temp Source: Esophageal (08/30 0200) BP: 128/75 (08/30 0230) Pulse Rate: 114 (08/30 0230)  Labs: Recent Labs    05/17/2019 0647  05/17/2019 0713  04/24/2019 1007  05/15/2019 1430 05/03/2019 1508  05/19/19 0448 05/19/19 1210  05/20/19 0350  05/20/19 0456 05/20/19 0635 05/20/19 1436 05/20/19 2343  HGB 8.7*   < >  --    < > 8.8*   < >  --   --    < >  --  7.9*   < > 8.3*   < > 8.8* 8.8*  --  7.1*  HCT 28.4*   < >  --    < > 27.5*   < >  --   --    < >  --  24.0*   < > 26.2*   < > 26.0* 26.0*  --  21.0*  PLT 265  --   --   --  200  --   --   --   --   --  235  --  219  --   --   --   --   --   APTT 39*  --  >200*  --   --   --  97*  --   --  46*  --   --   --   --   --   --   --   --   LABPROT 16.4*  --  18.7*  --   --   --  17.7*  --   --  16.3*  --   --   --   --   --   --   --   --   INR 1.3*  --  1.6*  --   --   --  1.5*  --   --  1.3*  --   --   --   --   --   --   --   --   HEPARINUNFRC  --   --    --   --   --   --  0.19*  --   --   --   --   --   --   --   --   --   --   --   CREATININE 14.39*   < > 13.12*   < >  --    < >  --   --    < >  --   --    < >  6.33*   < >  --  5.90* 4.48* 3.30*  TROPONINIHS 840*  --   --   --   --   --   --  >27,000*  --   --   --   --   --   --   --   --   --   --    < > = values in this interval not displayed.    Estimated Creatinine Clearance: 24.5 mL/min (A) (by C-G formula based on SCr of 3.3 mg/dL (H)).   Medical History: Past Medical History:  Diagnosis Date  . Anemia   . Anxiety   . Aortic valve stenosis   . Arthritis   . Asthma    per pt hx  . Dyspnea   . End stage renal disease on home HD 07/10/2011   Started HD in September 2012 at [facility] with a tunneled HD catheter, now on home HD with NxtStage. Dialyzing through AVF L lower arm with buttonhole technique as of mid 2014. His brother does the HD treatments at home.  They are roommates for 23 years.  The brother works 3rd shift and gets off about 8am and then puts Mr Laughter on HD in the morning after getting home. Most of the time he does HD about 4 times a week, for about 4 hours per treatment. Cause of ESRD was HTN according to patient. He says he let his health go and ending up with complications, and that he didn't like seeing doctors in those days.  He says he was diagnosed with severe HTN when he lived in [place] in his 23's.   . Hepatitis B carrier ([place])   . HIV infection ([place])   . Hypertension   . Hyperthyroidism    normal now  . Pneumonia several yrs ago  . Seizures ([place]) 06/02/2011   had a mild one in Sept. 2019  . Sleep apnea    to start Cpap soon  . Thrombocytopenia [facility])      Assessment: 63 y/o M with recent cardiac arrest and cath, possible new EKG changes, stating heparin while awaiting cardiology re-eval. Hgb 7.1, no active bleeding noted. Has anemia of chronic disease.   Goal of Therapy:  Heparin level 0.3-0.7 units/ml Monitor platelets by anticoagulation  protocol: Yes   Plan:  DC subcutaneous heparin Will avoid bolus with low Hgb and recent cath Start heparin drip at 1200 units/hr 1200 HL Daily CBC/HL Monitor for bleeding  Narda Bonds, PharmD, BCPS Clinical Pharmacist Phone: [phone]

## 2019-05-21 NOTE — Progress Notes (Signed)
[place] for Heparin  Indication: chest pain/ACS  Allergies  Allergen Reactions  . Lisinopril Anaphylaxis and Shortness Of Breath    Throat swelling  . Penicillins Anaphylaxis and Other (See Comments)    Childhood allergy Has patient had a PCN reaction causing immediate rash, facial/tongue/throat swelling, SOB or lightheadedness with hypotension: Yes Has patient had a PCN reaction causing severe rash involving mucus membranes or skin necrosis: Unk Has patient had a PCN reaction that required hospitalization: Unk Has patient had a PCN reaction occurring within the last 10 years: No If all of the above answers are "NO", then may proceed with Cephalosporin use.   . Fentanyl Other (See Comments)    Lethargy, AMS  . Morphine And Related Other (See Comments)    "Not Himself"    Patient Measurements: Height: 6' (182.9 cm) Weight: 194 lb 10.7 oz (88.3 kg) IBW/kg (Calculated) : 77.6  Vital Signs: Temp: 98.8 F (37.1 C) (08/30 1800) Temp Source: Esophageal (08/30 1800) BP: 108/60 (08/30 1815) Pulse Rate: 54 (08/30 1815)  Labs: Recent Labs    05/19/19 0448 05/19/19 1210  05/20/19 0350  05/20/19 2343 05/21/19 0326 05/21/19 0507 05/21/19 0534 05/21/19 0800 05/21/19 1120 05/21/19 1734  HGB  --  7.9*   < > 8.3*   < > 7.1* 7.4* 7.5*  --   --   --   --   HCT  --  24.0*   < > 26.2*   < > 21.0* 24.3* 22.0*  --   --   --   --   PLT  --  235  --  219  --   --  183  --   --   --   --   --   APTT 46*  --   --   --   --   --   --   --   --   --   --   --   LABPROT 16.3*  --   --   --   --   --   --   --   --   --   --   --   INR 1.3*  --   --   --   --   --   --   --   --   --   --   --   HEPARINUNFRC  --   --   --   --   --   --   --   --   --   --  0.62 0.42  CREATININE  --   --    < > 6.33*   < > 3.30* 3.20*  --   --   --   --  2.42*  TROPONINIHS  --   --   --   --   --   --  >27,000*  --  >27,000* 56,213*  --   --    < > = values in this  interval not displayed.    Estimated Creatinine Clearance: 33.4 mL/min (A) (by C-G formula based on SCr of 2.42 mg/dL (H)).   Medical History: Past Medical History:  Diagnosis Date  . Anemia   . Anxiety   . Aortic valve stenosis   . Arthritis   . Asthma    per pt hx  . Dyspnea   . End stage renal disease on home HD 07/10/2011   Started HD in September 2012 at [facility]  with a tunneled HD catheter, now on home HD with NxtStage. Dialyzing through AVF L lower arm with buttonhole technique as of mid 2014. His brother does the HD treatments at home.  They are roommates for 23 years.  The brother works 3rd shift and gets off about 8am and then puts Mr Jeffrey [name] on HD in the morning after getting home. Most of the time he does HD about 4 times a week, for about 4 hours per treatment. Cause of ESRD was HTN according to patient. He says he let his health go and ending up with complications, and that he didn't like seeing doctors in those days.  He says he was diagnosed with severe HTN when he lived in [place] in his 15's.   . Hepatitis B carrier ([place])   . HIV infection ([place])   . Hypertension   . Hyperthyroidism    normal now  . Pneumonia several yrs ago  . Seizures ([place]) 06/02/2011   had a mild one in Sept. 2019  . Sleep apnea    to start Cpap soon  . Thrombocytopenia [facility])      Assessment: 63 y/o M with recent cardiac arrest and cath, possible new EKG changes, stating heparin while awaiting cardiology re-eval. Hgb 7.1, no active bleeding noted. Has anemia of chronic disease.   Confirm level therapeutic  Goal of Therapy:  Heparin level 0.3-0.7 units/ml Monitor platelets by anticoagulation protocol: Yes   Plan:  Continue heparin drip at 1200 units/hr Daily CBC/HL Monitor for bleeding  Thank you Anette Guarneri, PharmD 05/21/2019 6:43 PM

## 2019-05-21 NOTE — Procedures (Signed)
Patient Name: Jeffrey [name]  MRN: [id number]  Epilepsy Attending: Lora Havens  Referring Physician/Provider: Brand Males Date: 05/21/2019  Duration: 39.17  Patient history: 63yo M with cardiac arrest, now on TTM. EEG to evaluate for seizures.  Level of alertness: comatose   AEDs during EEG study: none  Technical aspects: This EEG study was done with scalp electrodes positioned according to the 10-20 International system of electrode placement. Electrical activity was acquired at a sampling rate of 500Hz  and reviewed with a high frequency filter of 70Hz  and a low frequency filter of 1Hz . EEG data were recorded continuously and digitally stored.   DESCRIPTION: EEG showed continuous generalized 2-3Hz  delta slowing. There were also 1-1.5hz  triphasic waves, generalized, maximal bifrontal. EEG was reactive to tactile stimulation. Hyperventilation and photic stimulation were not performed.  IMPRESSION:  This study is suggestive of severe diffuse encephalopathy. This could be secondary to toxic-metabolic etiology and may also be seen with cefepime use. No seizures or definite epileptiform discharges were seen throughout the recording.  [name] [name]

## 2019-05-21 NOTE — Progress Notes (Signed)
Jeffrey [name] for Heparin  Indication: chest pain/ACS  Allergies  Allergen Reactions  . Lisinopril Anaphylaxis and Shortness Of Breath    Throat swelling  . Penicillins Anaphylaxis and Other (See Comments)    Childhood allergy Has patient had a PCN reaction causing immediate rash, facial/tongue/throat swelling, SOB or lightheadedness with hypotension: Yes Has patient had a PCN reaction causing severe rash involving mucus membranes or skin necrosis: Unk Has patient had a PCN reaction that required hospitalization: Unk Has patient had a PCN reaction occurring within the last 10 years: No If all of the above answers are "NO", then may proceed with Cephalosporin use.   . Fentanyl Other (See Comments)    Lethargy, AMS  . Morphine And Related Other (See Comments)    "Not Himself"    Patient Measurements: Height: 6' (182.9 cm) Weight: 194 lb 10.7 oz (88.3 kg) IBW/kg (Calculated) : 77.6  Vital Signs: Temp: 97.9 F (36.6 C) (08/30 1200) Temp Source: Esophageal (08/30 1200) BP: 90/69 (08/30 1300) Pulse Rate: 77 (08/30 1300)  Labs: Recent Labs    04/22/2019 1430  05/19/19 0448 05/19/19 1210  05/20/19 0350  05/20/19 1436 05/20/19 2343 05/21/19 0326 05/21/19 0507 05/21/19 0534 05/21/19 0800 05/21/19 1120  HGB  --    < >  --  7.9*   < > 8.3*   < >  --  7.1* 7.4* 7.5*  --   --   --   HCT  --    < >  --  24.0*   < > 26.2*   < >  --  21.0* 24.3* 22.0*  --   --   --   PLT  --   --   --  235  --  219  --   --   --  183  --   --   --   --   APTT 97*  --  46*  --   --   --   --   --   --   --   --   --   --   --   LABPROT 17.7*  --  16.3*  --   --   --   --   --   --   --   --   --   --   --   INR 1.5*  --  1.3*  --   --   --   --   --   --   --   --   --   --   --   HEPARINUNFRC 0.19*  --   --   --   --   --   --   --   --   --   --   --   --  0.62  CREATININE  --    < >  --   --    < > 6.33*   < > 4.48* 3.30* 3.20*  --   --   --   --   TROPONINIHS   --    < >  --   --   --   --   --   --   --  >27,000*  --  >27,000* 60,454*  --    < > = values in this interval not displayed.    Estimated Creatinine Clearance: 25.3 mL/min (A) (by C-G formula based on SCr of 3.2 mg/dL (H)).   Medical History: Past  Medical History:  Diagnosis Date  . Anemia   . Anxiety   . Aortic valve stenosis   . Arthritis   . Asthma    per pt hx  . Dyspnea   . End stage renal disease on home HD 07/10/2011   Started HD in September 2012 at [facility] with a tunneled HD catheter, now on home HD with NxtStage. Dialyzing through AVF L lower arm with buttonhole technique as of mid 2014. His brother does the HD treatments at home.  They are roommates for 23 years.  The brother works 3rd shift and gets off about 8am and then puts [name] on HD in the morning after getting home. Most of the time he does HD about 4 times a week, for about 4 hours per treatment. Cause of ESRD was HTN according to patient. He says he let his health go and ending up with complications, and that he didn't like seeing doctors in those days.  He says he was diagnosed with severe HTN when he lived in [place] in his 47's.   . Hepatitis B carrier ([place])   . HIV infection ([place])   . Hypertension   . Hyperthyroidism    normal now  . Pneumonia several yrs ago  . Seizures ([place]) 06/02/2011   had a mild one in Sept. 2019  . Sleep apnea    to start Cpap soon  . Thrombocytopenia [facility])      Assessment: 63 y/o M with recent cardiac arrest and cath, possible new EKG changes, stating heparin while awaiting cardiology re-eval. Hgb 7.1, no active bleeding noted. Has anemia of chronic disease.   Initial heparin level at goal 0.62, hgb low in 7s but no overt bleeding noted. Recheck confirmatory level this evening  Goal of Therapy:  Heparin level 0.3-0.7 units/ml Monitor platelets by anticoagulation protocol: Yes   Plan:  Continue heparin drip at 1200 units/hr 1800 HL Daily CBC/HL Monitor for  bleeding  Jeffrey [name] PharmD., BCPS Clinical Pharmacist 05/21/2019 1:13 PM

## 2019-05-21 NOTE — Progress Notes (Signed)
BRIEF PROGRESS NOTE   Date of Service  05/21/2019  HPI/Events of Note  Curbsided by team regarding Mr. Mcfate's abnormal ECG concerning for anterolateral ST elevations.  Patient with recent VF arrest status post LAD PCI.  Discussed with interventional attending on call, Dr. Burt Knack.  Given patient's poor neurologic status and relative hemodynamic stability, we decided that invasive evaluation was not fully warranted, as ECG findings could be a harbinger of patient's critical illness.  He will continue on his current optimal medical management.   Troponin # 1 > 27,000 - However, prior troponin on 8/27 was also greater than 27,000.   Cardiology will continue to follow.

## 2019-05-21 NOTE — Progress Notes (Signed)
[place] Progress Note Patient Name: Jeffrey [name] DOB: [birth year] MRN: [id number]   Date of Service  05/21/2019  HPI/Events of Note  Troponin # 1 > 27,000 - However, the patient does have ESRD which may elevate Troponin. Case discussed with Cardiology fellow on call when EKG was c/w acute STEMI. Given the patient's severe neurological deficit, he was not felt to be a candidate for emergent cardiac cath. Currently on ASA, Brilinta, Metoprolol and a Heparin IV infusion. This represents maximal medical therapy.   eICU Interventions  Will order: 1. Will inform cardiology of results of Troponin      Intervention Category Intermediate Interventions: Diagnostic test evaluation  [name] 05/21/2019, 5:08 AM

## 2019-05-21 NOTE — Progress Notes (Signed)
eLink Physician-Brief Progress Note Patient Name: Jeffrey [name] DOB: April 25, 1956 MRN: [id number]   Date of Service  05/21/2019   12 lead EKG: Sinus tachycardia Possible Left atrial enlargement Inferior infarct , age undetermined Anterolateral injury pattern c/w ACUTE MI   eICU Interventions  Will order: 1. Heparin per pharmacy ACS protocol. 2. Metroprolol 2.5 mg IV now and Q 6 hours.  3. Nursing to ask cardiology to re-evaluate EKG and patient.     Intervention Category Major Interventions: Other:  Jeffrey [name] 05/21/2019, 2:24 AM

## 2019-05-21 NOTE — Progress Notes (Addendum)
NAME:  Jeffrey [name], MRN:  [id number], DOB:  30-Apr-1956, LOS: 3 ADMISSION DATE:  04/29/2019, CONSULTATION DATE:  8/27REFERRING MD:  cardama, CHIEF COMPLAINT:  Cardiac arrest   Brief History   63 year old male patient with end-stage renal disease on home dialysis admitted 8/27 status post VF arrest estimated time to ROSC 35 minutes.  Past Medical History  Prior aortic valve insufficiency, status post aortic valve endocarditis requiring aortic valve replacement 2019 Known diffuse nonobstructive coronary artery disease HIV Hepatitis B End-stage renal disease on home dialysis Hypothyroidism Peripheral vascular disease Prior GI bleed Coronary artery disease Prior left VATS for fibrothorax following bacteremia in February 2020 Baseline PFT Nov 2019: Fev1 0.95L/29%, DLCO 12.67/36%  Significant Hospital Events   8/27: Admitted status post VF arrest.  Estimated downtime 35 minutes.  Went for left heart cath, findings:Mid LAD lesion 100% stenosis, drug-eluting stent was successfully placed.  A distal/apical LAD lesion was stenosed at 80% and this was treated with PTCA.  He had nonobstructive disease involving the circumflex and RCA.  Hypothermia protocol initiated at: 1104 am temp goal reached    8/28 start re-warming at 11 am. Still pressor dependent. Left IJ site oozing required extra suture. EEg neg for seizure. Amiodarone stopped d/t bradycardia. Sending sputum and PCTs. Placing HD cath for CRRT. amio stopped overnight. Hypotensive   8/29  off levophed. On vent 40% fi02. Rewarmed at 3.30 am. Off sedation. Has cough and opened eyes but not following comands   Consults:  Cardiology   Procedures:  oett 8/27>>> Left IJ CVL 8/27>>> Aline 8/28 Right IJ HD cath 8/28>>>  Significant Diagnostic Tests:  Left heart cath 8/28: Mid LAD lesion 100% stenosis, drug-eluting stent was successfully placed.  A distal/apical LAD lesion was stenosed at 80% and this was treated with PTCA.  He had  nonobstructive disease involving the circumflex and RCA. EEG 8/27>>>no seizure    Micro Data:  Corona virus 8/27: neg MRSA PCR 8/.27 - neg Respiratory culture 8/28>>>MODERATE PSEUDOMONAS AERUGINOSA   Antimicrobials:  Cefepime 8/29 >.  Interim history/subjective:   05/21/2019 -> growing pseudomonas in lung STartd on IV heparin ovenight following MI concern (HS Trop > 27,000) . Per cards conitnue med mgmt. Poor cath candidate. On CRRT + . On levophed gtt. Remains comatose without continuous sedation.  Possible mycolonus brief ovrenight    Objective   Blood pressure 97/63, pulse 78, temperature 98.4 F (36.9 C), temperature source Esophageal, resp. rate 14, height 6' (1.829 m), weight 88.3 kg, SpO2 100 %. CVP:  [3 mmHg-4 mmHg] 3 mmHg  Vent Mode: PRVC FiO2 (%):  [40 %] 40 % Set Rate:  [14 bmp] 14 bmp Vt Set:  [620 mL] 620 mL PEEP:  [5 cmH20] 5 cmH20 Plateau Pressure:  [16 cmH20-21 cmH20] 18 cmH20   Intake/Output Summary (Last 24 hours) at 05/21/2019 0828 Last data filed at 05/21/2019 0700 Gross per 24 hour  Intake 1365.98 ml  Output 3325 ml  Net -1959.02 ml   Filed Weights   05/05/2019 0650 05/19/19 0500 05/20/19 0435  Weight: 87 kg 89.9 kg 88.3 kg   General Appearance:  Looks criticall ill Head:  Normocephalic, without obvious abnormality, atraumatic Eyes:  PERRL - pinpoint, conjunctiva/corneas - muddy     Ears:  Normal external ear canals, both ears Nose:  G tube - no Throat:  ETT TUBE - yes , OG tube - yes Neck:  Supple,  No enlargement/tenderness/nodules Lungs: Clear to auscultation bilaterally, Ventilator   Synchrony - yes Heart:  S1 and S2 normal, no murmur, CVP - x.  Pressors - levophed 7 Abdomen:  Soft, no masses, no organomegaly Genitalia / Rectal:  Not done Extremities:  Extremities- intact Skin:  ntact in exposed areas . Sacral area - not examined Neurologic:  Sedation - none -> RASS - -4. Gag +      Resolved Hospital Problem list     Assessment & Plan:   Known history of coronary artery disease with prior CABG and aortic valve replacement  VF arrest 04/26/2019  in setting of severe CAD, now s/p DES to mid LAD and PTCA to distal/apical LAD lesion. Amiodarone stopped d/t bradycardia (8/29)    05/21/2019 - high trop 27K. On IV heparin. No role for intervention per cards   Plan Cont tele  Cont uninterrupted dual platelet therapy for at least 12 months  Continue IV heparin No role for intervention    Circulatory shock.  New CM w/ EF 45-50% and mod dyskinesis. Not sure if shock state is all cardiogenic, also consider hypothermia affect or also sepsisPCT 20 and c.w sepsis  05/21/2019 - back on pressors  Plan Cont to titrate noreepi for MAP >70 Supportive care otherwise  Acute respiratory failure s/p cardiopulm arrest.   05/21/2019 - does not meet SBT criteria due to coma  Plan Cont full vent support - PRVC VAP bundle    Acute metabolic encephalopathy: At risk for anoxic injury ->EEG neg for seizure  8.27  05/21/2019 - off sedation x 24h. Stil comatose. Possible myoclonus overnight   Plan  prn sedation Aim CT head 05/21/19 Repeat EEG Neuro consul depending on above   End-stage renal disease w/ residual anion gap metabolic acidosis (lactic acid was 6.6-->suspect just clearing slower).  - on home dialysis  05/21/2019 - on CRRT  Plan CRRT to continue    Intermittent Fluid and electrolyte imbalance Plan Monitor and replace  Anemia of chronic disease Plan - PRBC for hgb </= 6.9gm%    - exceptions are   -  if ACS susepcted/confirmed then transfuse for hgb </= 8.0gm%,  or    -  active bleeding with hemodynamic instability, then transfuse regardless of hemoglobin value   At at all times try to transfuse 1 unit prbc as possible with exception of active hemorrhage   KNown HIV and HepB Currently - Severe sepsis (PCT > 20) - MRSA PCR neg. Pseudomonas in sputum +  Plan  - continue baseline biktarvy   - continue  cefepime    Hyperglycemia Plan Hyperglycemia protocol     Best practice:  Diet: NPO Pain/Anxiety/Delirium protocol (if indicated): hypothermia protocol 8/27 VAP protocol (if indicated): 8/27 DVT prophylaxis: [place] heparin  GI prophylaxis: PPI Glucose control: 8/27 Mobility: BR Code Status: full code Family Communication:  On 05/21/2019   - bborther updated over phone. He wants another update when he gets to bedside - this was done 11.35am - he said he will discuss with his sister about code status. Till then full code  Disposition: ICU     ATTESTATION & SIGNATURE   The patient Jeffrey [name] is critically ill with multiple organ systems failure and requires high complexity decision making for assessment and support, frequent evaluation and titration of therapies, application of advanced monitoring technologies and extensive interpretation of multiple databases.   Critical Care Time devoted to patient care services described in this note is  30  Minutes. This time reflects time of care of this signee Dr Brand Males. This critical care time  does not reflect procedure time, or teaching time or supervisory time of PA/NP/Med student/Med Resident etc but could involve care discussion time     Dr. Brand Males, M.D., [facility].C.P Pulmonary and Critical Care Medicine Staff Physician [facility] Pager: [phone], If no answer or between  15:00h - 7:00h: call [phone]  05/21/2019 8:28 AM    LABS    PULMONARY Recent Labs  Lab 05/11/2019 0805  04/25/2019 1629  05/19/19 0335  05/20/19 0401 05/20/19 0456 05/20/19 0635 05/20/19 2343 05/21/19 0507  PHART 7.275*  --  7.477*  --  7.452*  --   --  7.387  --   --  7.478*  PCO2ART 42.1  --  26.0*  --  26.6*  --   --  36.9  --   --  37.0  PO2ART 485.0*  --  199.0*  --  174*  --   --  114.0*  --   --  108.0  HCO3 19.6*  --  20.0  --  19.3*  --   --  22.2  --   --  27.5  TCO2 21*    < > 21*   < >  --    < > 23 23 21* 25 29  O2SAT 100.0  --  100.0  --  97.4  --   --  98.0  --   --  99.0   < > = values in this interval not displayed.    CBC Recent Labs  Lab 05/19/19 1210  05/20/19 0350  05/20/19 2343 05/21/19 0326 05/21/19 0507  HGB 7.9*   < > 8.3*   < > 7.1* 7.4* 7.5*  HCT 24.0*   < > 26.2*   < > 21.0* 24.3* 22.0*  WBC 11.4*  --  14.3*  --   --  12.1*  --   PLT 235  --  219  --   --  183  --    < > = values in this interval not displayed.    COAGULATION Recent Labs  Lab 05/06/2019 0647 05/01/2019 0713 04/26/2019 1430 05/19/19 0448  INR 1.3* 1.6* 1.5* 1.3*    CARDIAC  No results for input(s): TROPONINI in the last 168 hours. No results for input(s): PROBNP in the last 168 hours.   CHEMISTRY Recent Labs  Lab 05/19/19 0435  05/19/19 1617  05/20/19 0350 05/20/19 0401  05/20/19 0635 05/20/19 1436 05/20/19 2343 05/21/19 0326 05/21/19 0507  NA 138   < > 137   < > 137 136   < > 138 137 137 138 137  K 4.0   < > 4.2   < > 4.6 4.6   < > 4.6 4.5 4.2 4.2 4.4  CL 98   < > 99   < > 102 103  --  105 102 103 99  --   CO2 19*  --  21*  --  22  --   --   --  23  --  26  --   GLUCOSE 141*   < > 114*   < > 112* 110*  --  121* 154* 132* 110*  --   BUN 88*   < > 71*   < > 41* 36*  --  35* 32* 25* 23  --   CREATININE 13.21*   < > 10.08*   < > 6.33* 6.20*  --  5.90* 4.48* 3.30* 3.20*  --  CALCIUM 8.2*  --  8.2*  --  8.6*  --   --   --  8.4*  --  8.5*  --   MG 1.6*  --   --   --  2.1  --   --   --   --   --  2.8*  --   PHOS 5.6*  --  4.4  --  2.8  --   --   --  2.4*  --  2.3*  --    < > = values in this interval not displayed.   Estimated Creatinine Clearance: 25.3 mL/min (A) (by C-G formula based on SCr of 3.2 mg/dL (H)).   LIVER Recent Labs  Lab 05/07/2019 0647 05/01/2019 0713 05/22/2019 1430 05/19/19 0448 05/19/19 1617 05/20/19 0350 05/20/19 1436 05/21/19 0326  AST  --   --   --   --   --  86*  --   --   ALT  --   --   --   --   --  43  --   --   ALKPHOS   --   --   --   --   --  15  --   --   BILITOT  --   --   --   --   --  0.4  --   --   PROT  --   --   --   --   --  6.7  --   --   ALBUMIN  --   --   --   --  2.8* 3.1* 2.7* 2.6*  INR 1.3* 1.6* 1.5* 1.3*  --   --   --   --      INFECTIOUS Recent Labs  Lab 04/22/2019 0648 05/19/19 1210  LATICACIDVEN 6.6*  --   PROCALCITON  --  20.59     ENDOCRINE CBG (last 3)  Recent Labs    05/20/19 1553 05/20/19 2012 05/21/19 0335  GLUCAP 77 141* 112*         IMAGING x48h  - image(s) personally visualized  -   highlighted in bold Dg Chest Port 1 View  Result Date: 05/20/2019 CLINICAL DATA:  Acute respiratory failure, status post CPR. EXAM: PORTABLE CHEST 1 VIEW COMPARISON:  1 day prior FINDINGS: Endotracheal tube terminates 3.5 cm above carina. Nasogastric tube extends beyond the inferior aspect of the film. Left internal jugular line tip at high SVC. Aortic valve repair. Midline trachea. Mild cardiomegaly. Left costophrenic angle excluded. No pleural effusion or pneumothorax. Improved interstitial edema with mild pulmonary venous congestion remaining. Areas of subsegmental atelectasis are improved. IMPRESSION: Improved aeration with decreased interstitial edema. Mild pulmonary venous congestion remains. Electronically Signed   By: Abigail Miyamoto M.D.   On: 05/20/2019 08:57

## 2019-05-22 ENCOUNTER — Inpatient Hospital Stay (HOSPITAL_COMMUNITY): Payer: Medicare Other

## 2019-05-22 ENCOUNTER — Other Ambulatory Visit: Payer: Medicare Other

## 2019-05-22 DIAGNOSIS — N186 End stage renal disease: Secondary | ICD-10-CM | POA: Diagnosis not present

## 2019-05-22 DIAGNOSIS — Z992 Dependence on renal dialysis: Secondary | ICD-10-CM | POA: Diagnosis not present

## 2019-05-22 LAB — CBC
HCT: 22 % — ABNORMAL LOW (ref 39.0–52.0)
HCT: 25.8 % — ABNORMAL LOW (ref 39.0–52.0)
HCT: 25.9 % — ABNORMAL LOW (ref 39.0–52.0)
Hemoglobin: 6.6 g/dL — CL (ref 13.0–17.0)
Hemoglobin: 7.8 g/dL — ABNORMAL LOW (ref 13.0–17.0)
Hemoglobin: 7.8 g/dL — ABNORMAL LOW (ref 13.0–17.0)
MCH: 29.7 pg (ref 26.0–34.0)
MCH: 29.1 pg (ref 26.0–34.0)
MCH: 28.9 pg (ref 26.0–34.0)
MCHC: 30 g/dL (ref 30.0–36.0)
MCHC: 30.2 g/dL (ref 30.0–36.0)
MCHC: 30.1 g/dL (ref 30.0–36.0)
MCV: 99.1 fL (ref 80.0–100.0)
MCV: 96.3 fL (ref 80.0–100.0)
MCV: 95.9 fL (ref 80.0–100.0)
Platelets: 183 10*3/uL (ref 150–400)
Platelets: 189 10*3/uL (ref 150–400)
Platelets: 173 10*3/uL (ref 150–400)
RBC: 2.22 MIL/uL — ABNORMAL LOW (ref 4.22–5.81)
RBC: 2.68 MIL/uL — ABNORMAL LOW (ref 4.22–5.81)
RBC: 2.7 MIL/uL — ABNORMAL LOW (ref 4.22–5.81)
RDW: 16.8 % — ABNORMAL HIGH (ref 11.5–15.5)
RDW: 17.9 % — ABNORMAL HIGH (ref 11.5–15.5)
RDW: 18.3 % — ABNORMAL HIGH (ref 11.5–15.5)
WBC: 15.1 10*3/uL — ABNORMAL HIGH (ref 4.0–10.5)
WBC: 16.3 10*3/uL — ABNORMAL HIGH (ref 4.0–10.5)
WBC: 13.8 10*3/uL — ABNORMAL HIGH (ref 4.0–10.5)
nRBC: 0.1 % (ref 0.0–0.2)
nRBC: 0.2 % (ref 0.0–0.2)
nRBC: 0.4 % — ABNORMAL HIGH (ref 0.0–0.2)

## 2019-05-22 LAB — BASIC METABOLIC PANEL
Anion gap: 13 (ref 5–15)
BUN: 17 mg/dL (ref 8–23)
CO2: 23 mmol/L (ref 22–32)
Calcium: 8.5 mg/dL — ABNORMAL LOW (ref 8.9–10.3)
Chloride: 98 mmol/L (ref 98–111)
Creatinine, Ser: 2.09 mg/dL — ABNORMAL HIGH (ref 0.61–1.24)
GFR calc Af Amer: 37 mL/min — ABNORMAL LOW (ref >60)
GFR calc non Af Amer: 32 mL/min — ABNORMAL LOW (ref >60)
Glucose, Bld: 132 mg/dL — ABNORMAL HIGH (ref 70–99)
Potassium: 3.9 mmol/L (ref 3.5–5.1)
Sodium: 134 mmol/L — ABNORMAL LOW (ref 135–145)

## 2019-05-22 LAB — CULTURE, RESPIRATORY W GRAM STAIN: Gram Stain: NONE SEEN

## 2019-05-22 LAB — RENAL FUNCTION PANEL
Albumin: 2.5 g/dL — ABNORMAL LOW (ref 3.5–5.0)
Anion gap: 10 (ref 5–15)
BUN: 17 mg/dL (ref 8–23)
CO2: 25 mmol/L (ref 22–32)
Calcium: 8.6 mg/dL — ABNORMAL LOW (ref 8.9–10.3)
Chloride: 103 mmol/L (ref 98–111)
Creatinine, Ser: 2.12 mg/dL — ABNORMAL HIGH (ref 0.61–1.24)
GFR calc Af Amer: 37 mL/min — ABNORMAL LOW (ref >60)
GFR calc non Af Amer: 32 mL/min — ABNORMAL LOW (ref >60)
Glucose, Bld: 108 mg/dL — ABNORMAL HIGH (ref 70–99)
Phosphorus: 1.7 mg/dL — ABNORMAL LOW (ref 2.5–4.6)
Potassium: 4.4 mmol/L (ref 3.5–5.1)
Sodium: 138 mmol/L (ref 135–145)

## 2019-05-22 LAB — PHOSPHORUS: Phosphorus: 2.2 mg/dL — ABNORMAL LOW (ref 2.5–4.6)

## 2019-05-22 LAB — GLUCOSE, CAPILLARY
Glucose-Capillary: 106 mg/dL — ABNORMAL HIGH (ref 70–99)
Glucose-Capillary: 111 mg/dL — ABNORMAL HIGH (ref 70–99)
Glucose-Capillary: 123 mg/dL — ABNORMAL HIGH (ref 70–99)
Glucose-Capillary: 130 mg/dL — ABNORMAL HIGH (ref 70–99)
Glucose-Capillary: 72 mg/dL (ref 70–99)
Glucose-Capillary: 95 mg/dL (ref 70–99)

## 2019-05-22 LAB — LACTIC ACID, PLASMA
Lactic Acid, Venous: 1.2 mmol/L (ref 0.5–1.9)
Lactic Acid, Venous: 0.9 mmol/L (ref 0.5–1.9)

## 2019-05-22 LAB — HEPARIN LEVEL (UNFRACTIONATED): Heparin Unfractionated: 0.29 IU/mL — ABNORMAL LOW (ref 0.30–0.70)

## 2019-05-22 LAB — MAGNESIUM: Magnesium: 2.7 mg/dL — ABNORMAL HIGH (ref 1.7–2.4)

## 2019-05-22 LAB — TROPONIN I (HIGH SENSITIVITY): Troponin I (High Sensitivity): 19926 ng/L (ref <18)

## 2019-05-22 MED ORDER — VITAL AF 1.2 CAL PO LIQD
1000.0000 mL | ORAL | Status: DC
  Administered 2019-05-22 – 2019-05-24 (×3): 1000 mL

## 2019-05-22 MED ORDER — PRO-STAT SUGAR FREE PO LIQD
30.0000 mL | Freq: Every day | ORAL | Status: DC
  Administered 2019-05-22 – 2019-05-25 (×4): 30 mL
  Filled 2019-05-22 (×4): qty 30

## 2019-05-22 MED ORDER — B COMPLEX-C PO TABS
1.0000 | ORAL_TABLET | Freq: Every day | ORAL | Status: DC
  Administered 2019-05-22 – 2019-05-23 (×2): 1 via ORAL
  Filled 2019-05-22 (×4): qty 1

## 2019-05-22 MED ORDER — VITAL HIGH PROTEIN PO LIQD: 1000.0000 mL | ORAL | Status: DC

## 2019-05-22 MED ORDER — POTASSIUM PHOSPHATES 15 MMOLE/5ML IV SOLN
20.0000 mmol | Freq: Once | INTRAVENOUS | Status: AC
  Administered 2019-05-22: 20 mmol via INTRAVENOUS
  Filled 2019-05-22: qty 6.67

## 2019-05-22 MED ORDER — SODIUM CHLORIDE 0.9% IV SOLUTION
Freq: Once | INTRAVENOUS | Status: AC
  Administered 2019-05-22: 07:00:00 via INTRAVENOUS

## 2019-05-22 NOTE — Progress Notes (Addendum)
[name] NEPHROLOGY PROGRESS NOTE  Assessment/ Plan: Pt is a 63 y.o. yo male with ESRD on home hemodialysis, history of hepatitis B, HIV, seizure disorder AVR, admitted with V. fib arrest found to have STEMI.  Cardiac angiogram with 80% distal LAD status post balloon angioplasty.  # ESRD: Tolerating CRRT well.  Currently he is off Levophed.  On IV heparin.  All 4K bath.  UF goal 50-100 cc an hour as tolerated.  Left femoral catheter placed on 8/28.  Continue current CRRT prescription.  # Anemia globin 6.6 today, receiving red blood cell transfusion.  Check iron studies.  # Secondary hyperparathyroidism: Low phosphorus level.  Ordered a dose of K- phos.   # HTN/volume: Blood pressure acceptable, off pressor.  UF during CRRT.  #Acute respiratory failure with hypoxia, vent dependent  #V. fib cardiac arrest with STEMI status post cardiac cath and stent placement to LAD.  #Acute metabolic encephalopathy: Monitor mental status.  Dialysis Orders:  HHD - 4d/week, 3.5hr, EDW ~79kg, AVF with buttonholes. Last dialysis 8/26. Outpatient Nephrologist is Dr. Holley Raring  Subjective: Seen and examined ICU.  Receiving blood transfusion.  Off levo.  Tolerating CRRT well.  Review of systems limited. Objective Vital signs in last 24 hours: Vitals:   05/22/19 0735 05/22/19 0745 05/22/19 0800 05/22/19 0815  BP: 120/71 129/72 131/82 137/75  Pulse: 91 81 90 89  Resp: (!) 21 19 (!) 21 (!) 21  Temp:      TempSrc:      SpO2: 100% 100% 100% 100%  Weight:      Height:       Weight change:   Intake/Output Summary (Last 24 hours) at 05/22/2019 0825 Last data filed at 05/22/2019 0800 Gross per 24 hour  Intake 1305.87 ml  Output 1432 ml  Net -126.13 ml       Labs: Basic Metabolic Panel: Recent Labs  Lab 05/21/19 0326 05/21/19 0507 05/21/19 1734 05/22/19 0415  NA 138 137 138 138  K 4.2 4.4 4.1 4.4  CL 99  --  103 103  CO2 26  --  25 25  GLUCOSE 110*  --  101* 108*  BUN 23   --  16 17  CREATININE 3.20*  --  2.42* 2.12*  CALCIUM 8.5*  --  8.3* 8.6*  PHOS 2.3*  --  2.3* 1.7*   Liver Function Tests: Recent Labs  Lab 05/20/19 0350  05/21/19 0326 05/21/19 1734 05/22/19 0415  AST 86*  --   --   --   --   ALT 43  --   --   --   --   ALKPHOS 87  --   --   --   --   BILITOT 0.4  --   --   --   --   PROT 6.7  --   --   --   --   ALBUMIN 3.1*   < > 2.6* 2.4* 2.5*   < > = values in this interval not displayed.   No results for input(s): LIPASE, AMYLASE in the last 168 hours. No results for input(s): AMMONIA in the last 168 hours. CBC: Recent Labs  Lab 05/06/2019 1007  05/19/19 1210  05/20/19 0350  05/21/19 0326 05/21/19 0507 05/22/19 0415  WBC 13.3*  --  11.4*  --  14.3*  --  12.1*  --  15.1*  HGB 8.8*   < > 7.9*   < > 8.3*   < > 7.4* 7.5* 6.6*  HCT  27.5*   < > 24.0*   < > 26.2*   < > 24.3* 22.0* 22.0*  MCV 94.2  --  92.7  --  93.6  --  96.8  --  99.1  PLT 200  --  235  --  219  --  183  --  183   < > = values in this interval not displayed.   Cardiac Enzymes: No results for input(s): CKTOTAL, CKMB, CKMBINDEX, TROPONINI in the last 168 hours. CBG: Recent Labs  Lab 05/21/19 1135 05/21/19 1619 05/21/19 1943 05/21/19 2355 05/22/19 0419  GLUCAP 112* 99 104* 97 106*    Iron Studies: No results for input(s): IRON, TIBC, TRANSFERRIN, FERRITIN in the last 72 hours. Studies/Results: No results found.  Medications: Infusions: .  prismasol BGK 4/2.5 500 mL/hr at 05/22/19 0212  .  prismasol BGK 4/2.5 500 mL/hr at 05/22/19 0208  . sodium chloride 10 mL/hr at 05/22/19 0800  . ceFEPime (MAXIPIME) IV Stopped (05/21/19 2232)  . heparin 1,200 Units/hr (05/22/19 0800)  . norepinephrine (LEVOPHED) Adult infusion Stopped (05/21/19 2355)  . prismasol BGK 4/2.5 2,000 mL/hr at 05/22/19 9758    Scheduled Medications: . sodium chloride   Intravenous Once  . artificial tears  1 application Both Eyes I3G  . aspirin  81 mg Per Tube Daily  .  bictegravir-emtricitabine-tenofovir AF  1 tablet Oral Daily  . chlorhexidine gluconate (MEDLINE KIT)  15 mL Mouth Rinse BID  . Chlorhexidine Gluconate Cloth  6 each Topical Daily  . feeding supplement (VITAL HIGH PROTEIN)  1,000 mL Per Tube Q24H  . insulin aspart  1-3 Units Subcutaneous Q4H  . mouth rinse  15 mL Mouth Rinse 10 times per day  . metoprolol tartrate  2.5 mg Intravenous Q6H  . pantoprazole sodium  40 mg Per Tube QHS  . sodium chloride flush  3 mL Intravenous Q12H  . ticagrelor  90 mg Per Tube BID    have reviewed scheduled and prn medications.  Physical Exam: General: Intubated, quite sedated Heart:RRR, s1s2 nl Lungs: Coarse breath sound bilateral, no wheezing Abdomen:soft, Non-tender, non-distended Extremities:No edema Dialysis Access: Left femoral temporary dialysis catheter, left upper extremity AV fistula  Khristie Sak Tanna Furry 05/22/2019,8:25 AM  LOS: 4 days  Pager: [phone]

## 2019-05-22 NOTE — Progress Notes (Signed)
CRITICAL VALUE ALERT  Critical Value:  Hgb = 6.6 today at 0415, down from 7.5 yesterday  Date & Time Notied:  05/22/19 4:57 AM  Provider Notified: Dr. Oletta Darter  Orders Received/Actions taken: 1 unit of RBCs and type and screen.... see MD's note.

## 2019-05-22 NOTE — Progress Notes (Signed)
[place] for Heparin  Indication: chest pain/ACS  Allergies  Allergen Reactions  . Lisinopril Anaphylaxis and Shortness Of Breath    Throat swelling  . Penicillins Anaphylaxis and Other (See Comments)    Childhood allergy Has patient had a PCN reaction causing immediate rash, facial/tongue/throat swelling, SOB or lightheadedness with hypotension: Yes Has patient had a PCN reaction causing severe rash involving mucus membranes or skin necrosis: Unk Has patient had a PCN reaction that required hospitalization: Unk Has patient had a PCN reaction occurring within the last 10 years: No If all of the above answers are "NO", then may proceed with Cephalosporin use.   . Fentanyl Other (See Comments)    Lethargy, AMS  . Morphine And Related Other (See Comments)    "Not Himself"    Patient Measurements: Height: 6' (182.9 cm) Weight: 189 lb 6 oz (85.9 kg) IBW/kg (Calculated) : 77.6  Vital Signs: Temp: 98.3 F (36.8 C) (08/31 0825) Temp Source: Oral (08/31 0825) BP: 145/82 (08/31 0845) Pulse Rate: 88 (08/31 0845)  Labs: Recent Labs    05/20/19 0350  05/21/19 0326 05/21/19 0507 05/21/19 0534 05/21/19 0800 05/21/19 1120 05/21/19 1734 05/22/19 0415  HGB 8.3*   < > 7.4* 7.5*  --   --   --   --  6.6*  HCT 26.2*   < > 24.3* 22.0*  --   --   --   --  22.0*  PLT 219  --  183  --   --   --   --   --  183  HEPARINUNFRC  --   --   --   --   --   --  0.62 0.42 0.29*  CREATININE 6.33*   < > 3.20*  --   --   --   --  2.42* 2.12*  TROPONINIHS  --   --  >27,000*  --  >27,000* 08,144*  --   --   --    < > = values in this interval not displayed.    Estimated Creatinine Clearance: 38.1 mL/min (A) (by C-G formula based on SCr of 2.12 mg/dL (H)).   Medical History: Past Medical History:  Diagnosis Date  . Anemia   . Anxiety   . Aortic valve stenosis   . Arthritis   . Asthma    per pt hx  . Dyspnea   . End stage renal disease on home HD  07/10/2011   Started HD in September 2012 at [facility] with a tunneled HD catheter, now on home HD with NxtStage. Dialyzing through AVF L lower arm with buttonhole technique as of mid 2014. His brother does the HD treatments at home.  They are roommates for 23 years.  The brother works 3rd shift and gets off about 8am and then puts Mr Sagan on HD in the morning after getting home. Most of the time he does HD about 4 times a week, for about 4 hours per treatment. Cause of ESRD was HTN according to patient. He says he let his health go and ending up with complications, and that he didn't like seeing doctors in those days.  He says he was diagnosed with severe HTN when he lived in [place] in his 25's.   . Hepatitis B carrier ([place])   . HIV infection ([place])   . Hypertension   . Hyperthyroidism    normal now  . Pneumonia several yrs ago  . Seizures ([place]) 06/02/2011  had a mild one in Sept. 2019  . Sleep apnea    to start Cpap soon  . Thrombocytopenia [facility])      Assessment: 63 y/o M with recent cardiac arrest and cath, possible new EKG changes, stating heparin while awaiting cardiology re-eval. Hgb 7.1, no active bleeding noted. Has anemia of chronic disease.   Heparin level down this morning to 0.29, will adjust. No bleeding issues noted, however hgb also down to 6.6 and will receive transfusion this morning.   Goal of Therapy:  Heparin level 0.3-0.7 units/ml Monitor platelets by anticoagulation protocol: Yes   Plan:  Increase heparin drip to 1300 units/hr Recheck cbc this afternoon, heparin level with am labs Monitor for bleeding  Thank you, Erin Hearing PharmD., BCPS Clinical Pharmacist 05/22/2019 9:14 AM

## 2019-05-22 NOTE — Progress Notes (Addendum)
Called Mr. [name] to obtain blood consent.... Education given to Mr. Foreman on goals and risks/side effects.... Brother of pt verb understanding and had no further questions.... Deeann Dowse, RN served as a witness and spoke w/pt as well.  Brother states he will be in later on today around 0800/0900

## 2019-05-22 NOTE — Progress Notes (Signed)
[facility] Progress Note Patient Name: Jeffrey [name] DOB: 01/14/1956 MRN: [id number]   Date of Service  05/22/2019  HPI/Events of Note  Anemia - Hgb = 6.6.  eICU Interventions  Will transfuse 1 unit PRBC.        [name] 05/22/2019, 4:55 AM

## 2019-05-22 NOTE — Progress Notes (Signed)
NAME:  Jeffrey [name], MRN:  [id number], DOB:  Sep 08, 1956, LOS: 4 ADMISSION DATE:  05/12/2019, CONSULTATION DATE:  8/27REFERRING MD:  cardama, CHIEF COMPLAINT:  Cardiac arrest   Brief History   63 year old male patient with end-stage renal disease on home dialysis admitted 8/27 status post VF arrest estimated time to ROSC 35 minutes.  Past Medical History  Prior aortic valve insufficiency, status post aortic valve endocarditis requiring aortic valve replacement 2019 Known diffuse nonobstructive coronary artery disease HIV Hepatitis B End-stage renal disease on home dialysis Hypothyroidism Peripheral vascular disease Prior GI bleed Coronary artery disease Prior left VATS for fibrothorax following bacteremia in February 2020 Baseline PFT Nov 2019: Fev1 0.95L/29%, DLCO 12.67/36%  Significant Hospital Events   8/27: Admitted status post VF arrest.  Estimated downtime 35 minutes.  Went for left heart cath, findings:Mid LAD lesion 100% stenosis, drug-eluting stent was successfully placed.  A distal/apical LAD lesion was stenosed at 80% and this was treated with PTCA.  He had nonobstructive disease involving the circumflex and RCA.  Hypothermia protocol initiated at: 1104 am temp goal reached  8/28 start re-warming at 11 am. Still pressor dependent. Left IJ site oozing required extra suture. EEg neg for seizure. Amiodarone stopped d/t bradycardia. Sending sputum and PCTs. Placing HD cath for CRRT. amio stopped overnight. Hypotensive   8/29  off levophed. On vent 40% fi02. Rewarmed at 3.30 am. Off sedation. Has cough and opened eyes but not following commands  8/30 Started on heparin gtt due to EKG changes of ischemia  Consults:  Cardiology   Procedures:  oett 8/27>>> Left IJ CVL 8/27>>> Aline 8/28 Right IJ HD cath 8/28>>>  Significant Diagnostic Tests:  Left heart cath 8/28: Mid LAD lesion 100% stenosis, drug-eluting stent was successfully placed.  A distal/apical LAD lesion was  stenosed at 80% and this was treated with PTCA.  He had nonobstructive disease involving the circumflex and RCA.  EEG 8/27>>>no seizure  EEG 8/30-severe diffuse encephalopathy   Micro Data:  Corona virus 8/27: neg MRSA PCR 8/.27 - neg Respiratory culture 8/28>>> Serratia, Pseudomonas  Antimicrobials:  Cefepime 8/29 >.  Interim history/subjective:   Transfused 1 unit PRBC for hemoglobin 6.6.  No obvious sign of bleeding Of Levophed.  Weaning on pressure support but mental status is still poor.  Objective   Blood pressure (!) 145/82, pulse 88, temperature 98.3 F (36.8 C), temperature source Oral, resp. rate (!) 21, height 6' (1.829 m), weight 85.9 kg, SpO2 100 %.    Vent Mode: CPAP;PSV FiO2 (%):  [40 %] 40 % Set Rate:  [14 bmp] 14 bmp Vt Set:  [620 mL] 620 mL PEEP:  [5 cmH20] 5 cmH20 Pressure Support:  [8 cmH20] 8 cmH20 Plateau Pressure:  [14 cmH20-22 cmH20] 14 cmH20   Intake/Output Summary (Last 24 hours) at 05/22/2019 0951 Last data filed at 05/22/2019 0900 Gross per 24 hour  Intake 1354.47 ml  Output 1473 ml  Net -118.53 ml   Filed Weights   05/19/19 0500 05/20/19 0435 05/22/19 0400  Weight: 89.9 kg 88.3 kg 85.9 kg    Gen:      No acute distress, critically ill-appearing HEENT:  EOMI, sclera anicteric, ET tube Neck:     No masses; no thyromegaly Lungs:    Clear to auscultation bilaterally; normal respiratory effor CV:         Regular rate and rhythm; no murmurs Abd:      + bowel sounds; soft, non-tender; no palpable masses, no distension Ext:  No edema; adequate peripheral perfusion Skin:      Warm and dry; no rash Neuro: Unresponsive.  Positive gag reflex.   Resolved Hospital Problem list     Assessment & Plan:  Known history of coronary artery disease with prior CABG and aortic valve replacement VF arrest 05/17/2019  in setting of severe CAD, now s/p DES to mid LAD and PTCA to distal/apical LAD lesion. Amiodarone stopped d/t bradycardia (8/29), heparin  started 8/30 Telemetry monitoring Cont uninterrupted dual platelet therapy for at least 12 months  Recheck troponin and EKG.  If downtrending then stop heparin Per cardiology there is no role for repeat invasive cardiac procedure Continue medical management  Circulatory shock.  New CM w/ EF 45-50% and mod dyskinesis Weaned off pressors, continue supportive care  Acute respiratory failure s/p cardiopulm arrest.  Continue pressure support weans.  Mental status is apparent extubation  Acute metabolic encephalopathy: At risk for anoxic injury ->EEG neg for seizure  8.27 Minimize sedation CT head  End-stage renal disease w/ residual anion gap metabolic acidosis (lactic acid was 6.6-->suspect just clearing slower).  - on home dialysis Continue CRRT   Anemia of chronic disease Low hemoglobin.  No evidence of active bleed. Status post 1 unit PRBC Follow CBC  Known HIV and HepB Pseudomonas, Serratia pneumonia Continue cefepime  Hyperglycemia Plan Monitor sugars  Best practice:  Diet: Tube feeds Pain/Anxiety/Delirium protocol (if indicated): hypothermia protocol 8/27 VAP protocol (if indicated): 8/27 DVT prophylaxis: Schriever heparin  GI prophylaxis: PPI Glucose control: 8/27 Mobility: BR Code Status: Full code Family Communication: Updated brother over telephone.  He is waiting for his sister to arrive tomorrow before deciding on CODE STATUS. Disposition: ICU  ATTESTATION & SIGNATURE   The patient is critically ill with multiple organ system failure and requires high complexity decision making for assessment and support, frequent evaluation and titration of therapies, advanced monitoring, review of radiographic studies and interpretation of complex data.   Critical Care Time devoted to patient care services, exclusive of separately billable procedures, described in this note is 35 minutes.   Marshell Garfinkel MD [facility] Pager [phone] If no  answer call [phone] 05/22/2019, 9:51 AM

## 2019-05-22 NOTE — Plan of Care (Signed)
  Problem: Clinical Measurements: Goal: Will remain free from infection Outcome: Progressing Goal: Respiratory complications will improve Outcome: Progressing   Problem: Nutrition: Goal: Adequate nutrition will be maintained Outcome: Progressing   Problem: Safety: Goal: Ability to remain free from injury will improve Outcome: Progressing   Problem: Skin Integrity: Goal: Risk for impaired skin integrity will decrease Outcome: Progressing   Problem: Respiratory: Goal: Ability to maintain a clear airway and adequate ventilation will improve Outcome: Progressing

## 2019-05-22 NOTE — Progress Notes (Signed)
Progress Note  Patient Name: Jeffrey [name] Date of Encounter: 05/22/2019  Primary Cardiologist: Sherren Mocha, MD   Subjective   Patient intubated and unresponsive.  Inpatient Medications    Scheduled Meds: . artificial tears  1 application Both Eyes E5U  . aspirin  81 mg Per Tube Daily  . B-complex with vitamin C  1 tablet Oral Daily  . bictegravir-emtricitabine-tenofovir AF  1 tablet Oral Daily  . chlorhexidine gluconate (MEDLINE KIT)  15 mL Mouth Rinse BID  . Chlorhexidine Gluconate Cloth  6 each Topical Daily  . feeding supplement (PRO-STAT SUGAR FREE 64)  30 mL Per Tube Daily  . feeding supplement (VITAL HIGH PROTEIN)  1,000 mL Per Tube Q24H  . insulin aspart  1-3 Units Subcutaneous Q4H  . mouth rinse  15 mL Mouth Rinse 10 times per day  . metoprolol tartrate  2.5 mg Intravenous Q6H  . pantoprazole sodium  40 mg Per Tube QHS  . sodium chloride flush  3 mL Intravenous Q12H  . ticagrelor  90 mg Per Tube BID   Continuous Infusions: .  prismasol BGK 4/2.5 500 mL/hr at 05/22/19 1237  .  prismasol BGK 4/2.5 500 mL/hr at 05/22/19 1235  . sodium chloride Stopped (05/22/19 0957)  . ceFEPime (MAXIPIME) IV Stopped (05/22/19 0943)  . feeding supplement (VITAL AF 1.2 CAL)    . norepinephrine (LEVOPHED) Adult infusion Stopped (05/21/19 2355)  . potassium PHOSPHATE IVPB (in mmol) 85 mL/hr at 05/22/19 1400  . prismasol BGK 4/2.5 2,000 mL/hr at 05/22/19 1240   PRN Meds: sodium chloride, acetaminophen (TYLENOL) oral liquid 160 mg/5 mL, acetaminophen, albuterol, fentaNYL (SUBLIMAZE) injection, heparin, midazolam PF, ondansetron (ZOFRAN) IV, sodium chloride, sodium chloride flush   Vital Signs    Vitals:   05/22/19 1230 05/22/19 1300 05/22/19 1330 05/22/19 1400  BP: (!) 178/110 (!) 157/93 (!) 141/87 (!) 143/78  Pulse: 93 85 86 88  Resp: 17 (!) 21 (!) 22 (!) 22  Temp:      TempSrc:      SpO2: 100% 100% 100% 100%  Weight:      Height:        Intake/Output Summary (Last 24  hours) at 05/22/2019 1437 Last data filed at 05/22/2019 1400 Gross per 24 hour  Intake 2116.24 ml  Output 2337 ml  Net -220.76 ml   Last 3 Weights 05/22/2019 05/20/2019 05/19/2019  Weight (lbs) 189 lb 6 oz 194 lb 10.7 oz 198 lb 3.5 oz  Weight (kg) 85.9 kg 88.3 kg 89.91 kg      Telemetry    Sinus rhythm with PVCs- Personally Reviewed  ECG    Normal sinus rhythm at 94 with J-point elevation in the anterior leads and PVCs- Personally Reviewed  Physical Exam   GEN: No acute distress.   Neck: No JVD Cardiac: RRR, no murmurs, rubs, or gallops.  Respiratory: Clear to auscultation bilaterally. GI: Soft, nontender, non-distended  MS: No edema; No deformity. Neuro:   Patient intubated and unresponsive Psych: Normal affect   Labs    High Sensitivity Troponin:   Recent Labs  Lab 04/27/2019 1508 05/21/19 0326 05/21/19 0534 05/21/19 0800 05/22/19 0956  TROPONINIHS >27,000* >27,000* >27,000* 31,497* 19,926*      Chemistry Recent Labs  Lab 05/20/19 0350  05/21/19 0326 05/21/19 0507 05/21/19 1734 05/22/19 0415  NA 137   < > 138 137 138 138  K 4.6   < > 4.2 4.4 4.1 4.4  CL 102   < > 99  --  103 103  CO2 22   < > 26  --  25 25  GLUCOSE 112*   < > 110*  --  101* 108*  BUN 41*   < > 23  --  16 17  CREATININE 6.33*   < > 3.20*  --  2.42* 2.12*  CALCIUM 8.6*   < > 8.5*  --  8.3* 8.6*  PROT 6.7  --   --   --   --   --   ALBUMIN 3.1*   < > 2.6*  --  2.4* 2.5*  AST 86*  --   --   --   --   --   ALT 43  --   --   --   --   --   ALKPHOS 87  --   --   --   --   --   BILITOT 0.4  --   --   --   --   --   GFRNONAA 8*   < > 19*  --  27* 32*  GFRAA 10*   < > 22*  --  31* 37*  ANIONGAP 13   < > 13  --  10 10   < > = values in this interval not displayed.     Hematology Recent Labs  Lab 05/21/19 0326 05/21/19 0507 05/22/19 0415 05/22/19 0956  WBC 12.1*  --  15.1* 16.3*  RBC 2.51*  --  2.22* 2.68*  HGB 7.4* 7.5* 6.6* 7.8*  HCT 24.3* 22.0* 22.0* 25.8*  MCV 96.8  --  99.1 96.3   MCH 29.5  --  29.7 29.1  MCHC 30.5  --  30.0 30.2  RDW 16.8*  --  16.8* 17.9*  PLT 183  --  183 189    BNPNo results for input(s): BNP, PROBNP in the last 168 hours.   DDimer No results for input(s): DDIMER in the last 168 hours.   Radiology    No results found.  Cardiac Studies   2D echocardiogram (05/22/2019)  IMPRESSIONS    1. Moderate dyskinesis of the left ventricular, entire apical segment.  2. The left ventricle has mildly reduced systolic function, with an ejection fraction of 45-50%. The cavity size was normal. There is moderate concentric left ventricular hypertrophy. Left ventricular diastolic function could not be evaluated secondary  to atrial fibrillation.  3. The right ventricle has normal systolic function. The cavity was normal. There is no increase in right ventricular wall thickness. Right ventricular systolic pressure is mildly elevated.  4. Left atrial size was mildly dilated.  5. Right atrial size was mildly dilated.  6. A 57m bioprosthesis valve is present in the aortic position. Normal aortic valve prosthesis.  7. The aorta is not well visualized unless otherwise noted.  8. The inferior vena cava was dilated in size with <50% respiratory variability   Cardiac catheterization/PCI and stent (05/05/2019)  Conclusion    Mid LAD lesion is 100% stenosed.  A drug-eluting stent was successfully placed using a STENT SYNERGY DES 2.5X20.  Post intervention, there is a 0% residual stenosis.  Dist/Apical LAD lesion is 80% stenosed, treated with PTCA using a 2.0 balloon.  Post intervention, there is a 30% residual stenosis.  Nonobstructive disease in teh circumflex and RCA.  No left heart cath due to bioprosthetic valve.   Diagnostic Dominance: Right  Intervention     Patient Profile     63y.o. male with history of bioprosthetic AVR last year secondary  to endocarditis with cardiac catheterization revealing noncritical CAD.  He does have HIV  and hepatitis B with chronic renal insufficiency on hemodialysis.  He was admitted 04/27/2019 with VF arrest and 35 minutes of CPR requiring defibrillation.  Is taken to the Cath Lab by Dr. Irish Lack revealing an occluded mid LAD which appeared to be thrombotic.  A synergy stent was placed.  Did have apical occlusion probably secondary to distal embolization.  His EF was moderately reduced compared to his last echo.  He underwent systemic cooling and is remained intubated since admission without meaningful neurological recovery.  Assessment & Plan    1: CAD- history of normal coronary arteries last year at the time of diagnostic cath prior to aortic valve replacement secondary to aortic insufficiency related endocarditis.  He presented with VF arrest and into ST segment elevation and was taken to the Cath Lab by Dr. Irish Lack revealing a thrombotic occlusion of the mid LAD.  Underwent PCI drug-eluting stenting.  Did have distalization with slow flow at the apex.  His circumflex and RCA were free of disease.  Did have a wall motion abnormality and is on dual antiplatelet therapy including aspirin and Brilinta.  Apparently there were questionable EKG changes over the weekend and he was placed on IV heparin.  He has J-point elevation but no clear injury current .  Given his severe anemia, I agree with discontinuing heparin at this time.  His troponins have been declining.  2: Ischemic cardiomyopathy- ejection fraction now in the 45 to 50% range whereas prior to aortic valve replacement and was normal.  This may have been a result of his recent anterior STEMI.  Will defer pharmacologic therapy until the time that he regains neurological recovery and is extubated.  3: Paroxysmal atrial fibrillation- rhythm strips have showed PAF during the hospitalization.  At this point, I would defer anticoagulation until we see whether or not he regains neurological recovery  4: Aortic valve replacement- status post AVR for  severe AI secondary to endocarditis last year by Dr. Roxan Hockey.  It certainly possible that he had an embolic event to his LAD either related to thrombosis on his AVR or PAF.  The 2D echo revealed this to be functioning properly.  At this point, I do not think that Mr. Prime needs to be on IV heparin.  The major issue now is neurologic recovery.  He did have an EEG yesterday and is scheduled to have a head CT today.  He is not making purposeful movements or withdrawing to pain.  Neurology is following.  We will continue to be available should his cardiovascular status change or he makes neurological recovery.  Continue dual antiplatelet therapy for now.  For questions or updates, please contact [facility] Please consult [email or web address] for contact info under        Signed, Quay Burow, MD  05/22/2019, 2:37 PM

## 2019-05-22 NOTE — Progress Notes (Signed)
Nutrition Follow-up  DOCUMENTATION CODES:   Not applicable  INTERVENTION:   -Recommend addition of b-complex with Vitamin C  Finish bottle of:  -Vital High Protein @ 50 ml/hr via NGT  Once liter runs out:  Vital AF 1.2 @ 65 ml/hr via NGT 30 ml Prostat once daily  Provides: 1972 kcals, 132 grams protein, 1265 ml free water. Meets 100% of needs.   NUTRITION DIAGNOSIS:   Increased nutrient needs related to post-op healing as evidenced by estimated needs.  Ongoing  GOAL:   Patient will meet greater than or equal to 90% of their needs  Addressed via TF  MONITOR:   Diet advancement, Vent status, Labs, Weight trends, TF tolerance, Skin, I & O's  REASON FOR ASSESSMENT:   Ventilator    ASSESSMENT:   Patient with PMH significant for ESRD on home dialysis, AVS, Hepatitis B, HIV, HTN, and seizures. Presents this admission with VF arrest in setting of severe CAD.   8/27- s/p L heart cath 8/28- R IJ cath placed, start CRRT   RD working remotely.  Pt rewarmed. Weaned off pressor support. Continues on CRRT- UF goal 50-100 ml/hour. Mental status remains poor. Per RN, pt tolerating Vital High Protein at 50 ml/hr. Phosphorus is being replaced. RD to make adjustments to TF.   EDW per nephrology: 79 kg  Admission weight: 87 kg  Current weight 85.9 kg   Patient remains  intubated on ventilator support MV: 12.3 L/min Temp (24hrs), Avg:98.6 F (37 C), Min:97.9 F (36.6 C), Max:99.1 F (37.3 C)   I/O: +1,513 ml since admit CRRT: 1,086 ml x 24 hrs NGT: 400 ml x 24 hrs   Drips: potassium phosphate Medications: SS novolog Labs: Cr 2.12- trending down Phosphorus 1.7 (L) Mg 2.7 (H)   Diet Order:   Diet Order            Diet NPO time specified  Diet effective now              EDUCATION NEEDS:   Not appropriate for education at this time  Skin:  Skin Assessment: Reviewed RN Assessment  Last BM:  8/29  Height:   Ht Readings from Last 1 Encounters:  05/10/2019  6' (1.829 m)    Weight:   Wt Readings from Last 1 Encounters:  05/22/19 85.9 kg    Ideal Body Weight:  80.9 kg  BMI:  Body mass index is 25.68 kg/m.  Estimated Nutritional Needs:   Kcal:  1963 kcal  Protein:  130-150 grams  Fluid:  1000 ml + UOP   Mariana Single RD, LDN Clinical Nutrition Pager # [phone]

## 2019-05-22 NOTE — Progress Notes (Signed)
Patient transported to CT and back to room 9E32 without complications.

## 2019-05-23 ENCOUNTER — Inpatient Hospital Stay (HOSPITAL_COMMUNITY): Payer: Medicare Other

## 2019-05-23 DIAGNOSIS — I639 Cerebral infarction, unspecified: Secondary | ICD-10-CM

## 2019-05-23 LAB — RENAL FUNCTION PANEL
Albumin: 2.8 g/dL — ABNORMAL LOW (ref 3.5–5.0)
Albumin: 2.8 g/dL — ABNORMAL LOW (ref 3.5–5.0)
Anion gap: 11 (ref 5–15)
Anion gap: 12 (ref 5–15)
BUN: 22 mg/dL (ref 8–23)
BUN: 24 mg/dL — ABNORMAL HIGH (ref 8–23)
CO2: 24 mmol/L (ref 22–32)
CO2: 24 mmol/L (ref 22–32)
Calcium: 8.7 mg/dL — ABNORMAL LOW (ref 8.9–10.3)
Calcium: 8.4 mg/dL — ABNORMAL LOW (ref 8.9–10.3)
Chloride: 101 mmol/L (ref 98–111)
Chloride: 100 mmol/L (ref 98–111)
Creatinine, Ser: 2.09 mg/dL — ABNORMAL HIGH (ref 0.61–1.24)
Creatinine, Ser: 1.88 mg/dL — ABNORMAL HIGH (ref 0.61–1.24)
GFR calc Af Amer: 37 mL/min — ABNORMAL LOW (ref >60)
GFR calc Af Amer: 42 mL/min — ABNORMAL LOW (ref >60)
GFR calc non Af Amer: 32 mL/min — ABNORMAL LOW (ref >60)
GFR calc non Af Amer: 37 mL/min — ABNORMAL LOW (ref >60)
Glucose, Bld: 114 mg/dL — ABNORMAL HIGH (ref 70–99)
Glucose, Bld: 125 mg/dL — ABNORMAL HIGH (ref 70–99)
Phosphorus: 1.6 mg/dL — ABNORMAL LOW (ref 2.5–4.6)
Phosphorus: 3.1 mg/dL (ref 2.5–4.6)
Potassium: 4.2 mmol/L (ref 3.5–5.1)
Potassium: 4.7 mmol/L (ref 3.5–5.1)
Sodium: 136 mmol/L (ref 135–145)
Sodium: 136 mmol/L (ref 135–145)

## 2019-05-23 LAB — CBC
HCT: 25.5 % — ABNORMAL LOW (ref 39.0–52.0)
Hemoglobin: 7.7 g/dL — ABNORMAL LOW (ref 13.0–17.0)
MCH: 29.4 pg (ref 26.0–34.0)
MCHC: 30.2 g/dL (ref 30.0–36.0)
MCV: 97.3 fL (ref 80.0–100.0)
Platelets: 176 10*3/uL (ref 150–400)
RBC: 2.62 MIL/uL — ABNORMAL LOW (ref 4.22–5.81)
RDW: 18.2 % — ABNORMAL HIGH (ref 11.5–15.5)
WBC: 13.4 10*3/uL — ABNORMAL HIGH (ref 4.0–10.5)
nRBC: 0.3 % — ABNORMAL HIGH (ref 0.0–0.2)

## 2019-05-23 LAB — TRIGLYCERIDES: Triglycerides: 139 mg/dL (ref <150)

## 2019-05-23 LAB — IRON AND TIBC
Iron: 36 ug/dL — ABNORMAL LOW (ref 45–182)
Saturation Ratios: 23 % (ref 17.9–39.5)
TIBC: 154 ug/dL — ABNORMAL LOW (ref 250–450)
UIBC: 118 ug/dL

## 2019-05-23 LAB — GLUCOSE, CAPILLARY
Glucose-Capillary: 100 mg/dL — ABNORMAL HIGH (ref 70–99)
Glucose-Capillary: 123 mg/dL — ABNORMAL HIGH (ref 70–99)
Glucose-Capillary: 120 mg/dL — ABNORMAL HIGH (ref 70–99)
Glucose-Capillary: 127 mg/dL — ABNORMAL HIGH (ref 70–99)
Glucose-Capillary: 98 mg/dL (ref 70–99)

## 2019-05-23 LAB — FERRITIN: Ferritin: 495 ng/mL — ABNORMAL HIGH (ref 24–336)

## 2019-05-23 LAB — MAGNESIUM: Magnesium: 2.5 mg/dL — ABNORMAL HIGH (ref 1.7–2.4)

## 2019-05-23 MED ORDER — STROKE: EARLY STAGES OF RECOVERY BOOK
Freq: Once | Status: AC
  Administered 2019-05-23: 21:00:00
  Filled 2019-05-23: qty 1

## 2019-05-23 MED ORDER — METOPROLOL TARTRATE 12.5 MG HALF TABLET
12.5000 mg | ORAL_TABLET | Freq: Two times a day (BID) | ORAL | Status: DC
  Administered 2019-05-23 (×2): 12.5 mg via ORAL
  Filled 2019-05-23 (×2): qty 1

## 2019-05-23 MED ORDER — DARBEPOETIN ALFA 60 MCG/0.3ML IJ SOSY
60.0000 ug | PREFILLED_SYRINGE | Freq: Once | INTRAMUSCULAR | Status: DC
  Filled 2019-05-23: qty 0.3

## 2019-05-23 MED ORDER — POTASSIUM PHOSPHATES 15 MMOLE/5ML IV SOLN
30.0000 mmol | Freq: Once | INTRAVENOUS | Status: AC
  Administered 2019-05-23: 10:00:00 30 mmol via INTRAVENOUS
  Filled 2019-05-23: qty 10

## 2019-05-23 MED ORDER — HEPARIN SODIUM (PORCINE) 5000 UNIT/ML IJ SOLN
5000.0000 [IU] | Freq: Three times a day (TID) | INTRAMUSCULAR | Status: DC
  Administered 2019-05-23 – 2019-06-02 (×30): 5000 [IU] via SUBCUTANEOUS
  Filled 2019-05-23 (×30): qty 1

## 2019-05-23 MED ORDER — SODIUM CHLORIDE 0.9 % IV SOLN
125.0000 mg | Freq: Every day | INTRAVENOUS | Status: AC
  Administered 2019-05-23 – 2019-05-25 (×3): 125 mg via INTRAVENOUS
  Filled 2019-05-23 (×3): qty 10

## 2019-05-23 MED ORDER — WHITE PETROLATUM EX OINT: TOPICAL_OINTMENT | CUTANEOUS | Status: DC | PRN

## 2019-05-23 MED ORDER — DARBEPOETIN ALFA 60 MCG/0.3ML IJ SOSY
60.0000 ug | PREFILLED_SYRINGE | Freq: Once | INTRAMUSCULAR | Status: AC
  Administered 2019-05-23: 11:00:00 60 ug via SUBCUTANEOUS
  Filled 2019-05-23: qty 0.3

## 2019-05-23 MED ORDER — METOPROLOL TARTRATE 5 MG/5ML IV SOLN
2.5000 mg | INTRAVENOUS | Status: DC | PRN
  Administered 2019-05-23: 2.5 mg via INTRAVENOUS
  Filled 2019-05-23: qty 5

## 2019-05-23 MED ORDER — SODIUM CHLORIDE 0.9 % IV SOLN
125.0000 mg | Freq: Every day | INTRAVENOUS | Status: DC
  Filled 2019-05-23 (×2): qty 10

## 2019-05-23 NOTE — Consult Note (Signed)
[facility] Nurse wound consult note Reason for Consult: pressure injuries from medical device  Wound type: MDRPI Pressure Injury POA: No Measurement: see nursing FS Wound bed: dark purple, non blanchable  Drainage (amount, consistency, odor) none Periwound: intact  Patient with MSOF, SP cardiac arrest with 35 min down time. Skin failure is more likely in this patient due to the other current organ failure (respiratory, vascular, kidney, brain injury) Dressing procedure/placement/frequency: vaseline only daily to keep skin moist and hopefully decrease the formation of eschar.  [facility] Nurse team will follow with you and see patient within 10 days for wound assessments.  Please notify [place] nurses of any acute changes in the wounds or any new areas of concern [place] MSN, [place], [place], [place]

## 2019-05-23 NOTE — Progress Notes (Signed)
Pt transported to MRI 

## 2019-05-23 NOTE — Progress Notes (Addendum)
NAME:  Jeffrey [name], MRN:  [id number], DOB:  [birth year], LOS: 5 ADMISSION DATE:  05/06/2019, CONSULTATION DATE:  8/27REFERRING MD:  cardama, CHIEF COMPLAINT:  Cardiac arrest   Brief History   63 year old male patient with end-stage renal disease on home dialysis admitted 8/27 status post VF arrest estimated time to ROSC 35 minutes.  Past Medical History  Prior aortic valve insufficiency, status post aortic valve endocarditis requiring aortic valve replacement 2019 Known diffuse nonobstructive coronary artery disease HIV Hepatitis B End-stage renal disease on home dialysis Hypothyroidism Peripheral vascular disease Prior GI bleed Coronary artery disease Prior left VATS for fibrothorax following bacteremia in February 2020 Baseline PFT Nov 2019: Fev1 0.95L/29%, DLCO 12.67/36%  Significant Hospital Events   8/27: Admitted status post VF arrest.  Estimated downtime 35 minutes.  Went for left heart cath, findings:Mid LAD lesion 100% stenosis, drug-eluting stent was successfully placed.  A distal/apical LAD lesion was stenosed at 80% and this was treated with PTCA.  He had nonobstructive disease involving the circumflex and RCA.  Hypothermia protocol initiated at: 1104 am temp goal reached  8/28 start re-warming at 11 am. Still pressor dependent. Left IJ site oozing required extra suture. EEg neg for seizure. Amiodarone stopped d/t bradycardia. Sending sputum and PCTs. Placing HD cath for CRRT. amio stopped overnight. Hypotensive   8/29  off levophed. On vent 40% fi02. Rewarmed at 3.30 am. Off sedation. Has cough and opened eyes but not following commands  8/30 Started on heparin gtt due to EKG changes of ischemia  8/31 Heparin drip stopped  Consults:  Cardiology   Procedures:  [place] 8/27>>> Left IJ CVL 8/27>>> Lt femoral HD cath 8/28>>>  Significant Diagnostic Tests:  Left heart cath 8/28: Mid LAD lesion 100% stenosis, drug-eluting stent was successfully placed.  A distal/apical LAD  lesion was stenosed at 80% and this was treated with PTCA.  He had nonobstructive disease involving the circumflex and RCA.  EEG 8/27>>>no seizure  EEG 8/30-severe diffuse encephalopathy  CT head 8/31- small vessel white matter disease MRI 8/31- patchy mild to focal ischemic infarcts with petechial hemorrhage, underlying atrophy with chronic microvascular ischemic disease.  Micro Data:  Corona virus 8/27: neg MRSA PCR 8/.27 - neg Respiratory culture 8/28>>> Serratia, Pseudomonas  Antimicrobials:  Cefepime 8/29 >.  Interim history/subjective:  No acute events overnight.  Mental status continues to be poor Taken off heparin after consultation with cardiology  Objective   Blood pressure 122/85, pulse (!) 119, temperature 98.4 F (36.9 C), temperature source Oral, resp. rate 18, height 6' (1.829 m), weight 85.4 kg, SpO2 90 %.    Vent Mode: CPAP;PSV FiO2 (%):  [40 %] 40 % Set Rate:  [14 bmp] 14 bmp Vt Set:  [620 mL] 620 mL PEEP:  [5 cmH20] 5 cmH20 Pressure Support:  [8 cmH20-10 cmH20] 10 cmH20 Plateau Pressure:  [9 cmH20-22 cmH20] 9 cmH20   Intake/Output Summary (Last 24 hours) at 05/23/2019 0936 Last data filed at 05/23/2019 0900 Gross per 24 hour  Intake 2400.67 ml  Output 3236 ml  Net -835.33 ml   Filed Weights   05/20/19 0435 05/22/19 0400 05/23/19 0400  Weight: 88.3 kg 85.9 kg 85.4 kg   Gen:      No acute distress HEENT:  EOMI, sclera anicteric, ET tube Neck:     No masses; no thyromegaly Lungs:    Clear to auscultation bilaterally; normal respiratory effort CV:         Regular rate and rhythm; no murmurs Abd:      +  bowel sounds; soft, non-tender; no palpable masses, no distension Ext:    No edema; adequate peripheral perfusion Skin:      Warm and dry; no rash Neuro: Unresponsive off sedation.   Resolved Hospital Problem list     Assessment & Plan:  Known history of coronary artery disease with prior CABG and aortic valve replacement VF arrest 05/05/2019  in  setting of severe CAD, now s/p DES to mid LAD and PTCA to distal/apical LAD lesion.  Amiodarone stopped d/t bradycardia (8/29), heparin started 8/30-8/31 Telemetry monitoring Cont uninterrupted dual platelet therapy for at least 12 months  Per cardiology there is no role for repeat invasive cardiac procedure Continue medical management  Circulatory shock.  New CM w/ EF 45-50% and mod dyskinesis Weaned off pressors, continue supportive care  Acute respiratory failure s/p cardiopulm arrest.  Continue pressure support weans.  Mental status is a barrier to extubation  Acute encephalopathy MRI reviewed with multiple embolic infarcts Has not woken up off sedation since 8/29 We will consult neurology.  End-stage renal disease w/ residual anion gap metabolic acidosis (lactic acid was 6.6-->suspect just clearing slower).  - on home dialysis Continue CRRT   Anemia of chronic disease Low hemoglobin.  No evidence of active bleed. Status post 1 unit PRBC on 8/31 Follow CBC  Known HIV and HepB Pseudomonas, Serratia pneumonia Continue cefepime  Hyperglycemia Plan Monitor sugars  Best practice:  Diet: Tube feeds Pain/Anxiety/Delirium protocol (if indicated): Off sedation VAP protocol (if indicated): Ordered DVT prophylaxis: [place] heparin  GI prophylaxis: PPI Glucose control:Monitor Mobility: BR Code Status: Full code Family Communication: Discussed at bedside with patient's brother and sister who just arrived from [place].  Reviewed code status.  They want to continue full code for now while awaiting for neurology assessment. Disposition: ICU  ATTESTATION & SIGNATURE   The patient is critically ill with multiple organ system failure and requires high complexity decision making for assessment and support, frequent evaluation and titration of therapies, advanced monitoring, review of radiographic studies and interpretation of complex data.   Critical Care Time devoted to patient care  services, exclusive of separately billable procedures, described in this note is 35 minutes.   Marshell Garfinkel MD [facility] Pager [phone] If no answer call [phone] 05/23/2019, 9:36 AM

## 2019-05-23 NOTE — Progress Notes (Signed)
PT transported to MRI and back with no complications.

## 2019-05-23 NOTE — Progress Notes (Signed)
Patient forehead.

## 2019-05-23 NOTE — Progress Notes (Addendum)
[facility] NEPHROLOGY PROGRESS NOTE  Assessment/ Plan: Pt is a 63 y.o. yo male with ESRD on home hemodialysis, history of hepatitis B, HIV, seizure disorder AVR, admitted with V. fib arrest found to have STEMI.  Cardiac angiogram with 80% distal LAD status post balloon angioplasty.  # ESRD: Tolerating CRRT well.  Currently he is off Levophed.  On IV heparin.  All 4K bath.  UF goal 50-100 cc an hour as tolerated.  Left femoral catheter placed on 8/28.  Continue current CRRT prescription.  # Anemia:, received red blood cell transfusion.  Iron sat 23 %. Order IV iron for 3 days and a dose of aranesp.   # Secondary hyperparathyroidism: Low phosphorus level. Continue K- phos.   # HTN/volume: Blood pressure acceptable, off pressor.  UF during CRRT.  #Acute respiratory failure with hypoxia, vent dependent  #V. fib cardiac arrest with STEMI status post cardiac cath and stent placement to LAD.  #Acute metabolic encephalopathy: Monitor mental status. MRI with stroke and likely embolic phenomena.  Dialysis Orders:  [facility]HD - 4d/week, 3.5hr, EDW ~79kg, AVF with buttonholes. Last dialysis 8/26. Outpatient Nephrologist is Dr. Holley Raring  Subjective: Seen and examined ICU.  Tolerating CRRT well.  Off pressor.  Remains unresponsive.    Objective Vital signs in last 24 hours: Vitals:   05/23/19 0600 05/23/19 0700 05/23/19 0735 05/23/19 0800  BP: (!) 148/98 121/85 121/84 (!) 139/96  Pulse: (!) 114 (!) 117  (!) 117  Resp: (!) 23 19  (!) 25  Temp:    98.4 F (36.9 C)  TempSrc:    Oral  SpO2: 100% 100% 100% 100%  Weight:      Height:       Weight change: -0.5 kg  Intake/Output Summary (Last 24 hours) at 05/23/2019 0835 Last data filed at 05/23/2019 0800 Gross per 24 hour  Intake 2397.67 ml  Output 3202 ml  Net -804.33 ml       Labs: Basic Metabolic Panel: Recent Labs  Lab 05/22/19 0415 05/22/19 1730 05/22/19 1912 05/23/19 0434  NA 138 134*  --  136  K 4.4 3.9  --  4.2   CL 103 98  --  101  CO2 25 23  --  24  GLUCOSE 108* 132*  --  114*  BUN 17 17  --  22  CREATININE 2.12* 2.09*  --  2.09*  CALCIUM 8.6* 8.5*  --  8.7*  PHOS 1.7*  --  2.2* 1.6*   Liver Function Tests: Recent Labs  Lab 05/20/19 0350  05/21/19 1734 05/22/19 0415 05/23/19 0434  AST 86*  --   --   --   --   ALT 43  --   --   --   --   ALKPHOS 87  --   --   --   --   BILITOT 0.4  --   --   --   --   PROT 6.7  --   --   --   --   ALBUMIN 3.1*   < > 2.4* 2.5* 2.8*   < > = values in this interval not displayed.   No results for input(s): LIPASE, AMYLASE in the last 168 hours. No results for input(s): AMMONIA in the last 168 hours. CBC: Recent Labs  Lab 05/21/19 0326  05/22/19 0415 05/22/19 0956 05/22/19 1730 05/23/19 0434  WBC 12.1*  --  15.1* 16.3* 13.8* 13.4*  HGB 7.4*   < > 6.6* 7.8* 7.8* 7.7*  HCT  24.3*   < > 22.0* 25.8* 25.9* 25.5*  MCV 96.8  --  99.1 96.3 95.9 97.3  PLT 183  --  183 189 173 176   < > = values in this interval not displayed.   Cardiac Enzymes: No results for input(s): CKTOTAL, CKMB, CKMBINDEX, TROPONINI in the last 168 hours. CBG: Recent Labs  Lab 05/22/19 1627 05/22/19 2022 05/22/19 2330 05/23/19 0440 05/23/19 0759  GLUCAP 95 111* 123* 100* 123*    Iron Studies:  Recent Labs    05/23/19 0434  IRON 36*  TIBC 154*  FERRITIN 495*   Studies/Results: Ct Head Wo Contrast  Result Date: 05/22/2019 CLINICAL DATA:  Encephalopathy EXAM: CT HEAD WITHOUT CONTRAST TECHNIQUE: Contiguous axial images were obtained from the base of the skull through the vertex without intravenous contrast. COMPARISON:  07/11/2018 FINDINGS: Brain: No evidence of acute infarction, hemorrhage, hydrocephalus, extra-axial collection or mass lesion/mass effect. Periventricular white matter hypodensity. Vascular: No hyperdense vessel or unexpected calcification. Skull: Normal. Negative for fracture or focal lesion. Sinuses/Orbits: No acute finding. Other: None. IMPRESSION: No  acute intracranial pathology.  Small-vessel white matter disease. Electronically Signed   By: Eddie Candle M.D.   On: 05/22/2019 15:31   Mr Brain Wo Contrast  Result Date: 05/23/2019 CLINICAL DATA:  Initial evaluation for acute altered mental status. EXAM: MRI HEAD WITHOUT CONTRAST TECHNIQUE: Multiplanar, multiecho pulse sequences of the brain and surrounding structures were obtained without intravenous contrast. COMPARISON:  Prior CT from 05/22/2019. FINDINGS: Brain: Examination severely degraded by motion artifact. Diffuse prominence of the CSF containing spaces compatible with generalized age-related cerebral atrophy. Patchy and confluent T2/FLAIR hyperintensity within the periventricular and deep white matter both cerebral hemispheres most consistent with chronic small vessel ischemic disease, moderate to advanced in nature. Superimposed remote lacunar infarct present at the left thalamus. Scattered multifocal foci of restricted diffusion seen involving the bilateral cerebral and cerebellar hemispheres, consistent with acute ischemic infarcts. The largest foci within the left cerebral hemisphere seen within the left occipital lobe in measures approximately 16 mm (series 5, image 61). Largest focus within the right cerebral hemisphere position within the right parietal cortex and measures 11 mm (series 5, image 80). Associated petechial hemorrhage about this infarct (series 15, image 18). No frank hemorrhagic transformation. Right greater than left cerebellar infarcts present, largest of which measures 16 mm on the right (series 5, image 51). Single punctate subcentimeter infarct noted involving the superior left thalamus (series 5, image 73). Deep gray nuclei and brainstem are otherwise spared. No other evidence for associated hemorrhage. No associated mass effect. Findings likely due to a central thromboembolic etiology. No mass lesion, midline shift, or visible mass effect. Ventricles normal size without  hydrocephalus. No extra-axial fluid collection. Pituitary gland suprasellar region within normal limits. Midline structures intact and normal. Vascular: Diminished flow void within the left V4 segment, which could be related to slow flow and/or occlusion (series 7, image 2). Major intracranial vascular flow voids otherwise maintained. Skull and upper cervical spine: Craniocervical junction within normal limits. Bone marrow signal intensity normal. No scalp soft tissue abnormality. Sinuses/Orbits: Globes and orbital soft tissues grossly within normal limits. Patient status post ocular lens replacement on the left. Paranasal sinuses are largely clear. Right greater than left mastoid effusions noted. Inner ear structures grossly normal. Other: None. IMPRESSION: 1. Technically limited exam due to extensive motion artifact. 2. Patchy multifocal acute ischemic infarcts involving the bilateral cerebral and cerebellar hemispheres as above. Given the various vascular distributions involved, a central  thromboembolic etiology suspected. 3. Associated petechial hemorrhage about 1 of these infarcts at the right parietal cortex as above. No other evidence for associated hemorrhage. No frank hemorrhagic transformation or significant mass effect. 4. Underlying atrophy with moderate chronic microvascular ischemic disease. Electronically Signed   By: Jeannine Boga M.D.   On: 05/23/2019 02:10   Dg Chest Port 1 View  Result Date: 05/23/2019 CLINICAL DATA:  Acute respiratory failure.  End-stage renal disease. EXAM: PORTABLE CHEST 1 VIEW COMPARISON:  One-view chest x-ray 05/20/2019 FINDINGS: Endotracheal tube terminates 5 cm above the carina. NG tube courses off the inferior border the film. A left IJ line extends horizontally and may be in the azygos. The heart is enlarged. Pulmonary vascular congestion has increased. No significant airspace consolidation is present. IMPRESSION: 1. Cardiomegaly and increasing pulmonary  vascular congestion. 2. Support apparatus is stable. Electronically Signed   By: San Morelle M.D.   On: 05/23/2019 07:57    Medications: Infusions: .  prismasol BGK 4/2.5 500 mL/hr at 05/23/19 0354  .  prismasol BGK 4/2.5 500 mL/hr at 05/23/19 0352  . sodium chloride 10 mL/hr at 05/23/19 0800  . ceFEPime (MAXIPIME) IV Stopped (05/22/19 2134)  . feeding supplement (VITAL AF 1.2 CAL) 65 mL/hr at 05/22/19 1800  . prismasol BGK 4/2.5 2,000 mL/hr at 05/23/19 4920    Scheduled Medications: . artificial tears  1 application Both Eyes F0O  . aspirin  81 mg Per Tube Daily  . B-complex with vitamin C  1 tablet Oral Daily  . bictegravir-emtricitabine-tenofovir AF  1 tablet Oral Daily  . chlorhexidine gluconate (MEDLINE KIT)  15 mL Mouth Rinse BID  . Chlorhexidine Gluconate Cloth  6 each Topical Daily  . feeding supplement (PRO-STAT SUGAR FREE 64)  30 mL Per Tube Daily  . insulin aspart  1-3 Units Subcutaneous Q4H  . mouth rinse  15 mL Mouth Rinse 10 times per day  . metoprolol tartrate  2.5 mg Intravenous Q6H  . pantoprazole sodium  40 mg Per Tube QHS  . sodium chloride flush  3 mL Intravenous Q12H  . ticagrelor  90 mg Per Tube BID    have reviewed scheduled and prn medications.  Physical Exam: General: Intubated, not responding. Heart:RRR, s1s2 nl, no rubs Lungs: Coarse breath sound bilateral, no wheezing Abdomen:soft, Non-tender, non-distended Extremities:No edema Dialysis Access: Left femoral temporary dialysis catheter, left upper extremity AV fistula  Nikyla Navedo Tanna Furry 05/23/2019,8:35 AM  LOS: 5 days  Pager: [phone]

## 2019-05-23 NOTE — Consult Note (Signed)
Neurology Consultation  Reason for Consult: Prognostication Referring Physician: Dr. Dimitri Ped  History is obtained from: Chart  HPI: Jeffrey [name] is a 63 y.o. male with history of thrombocytopenia, sleep apnea, seizures, hypertension, HIV, hepatitis B, end-stage renal disease, aortic valve stenosis.  On 8/27 patient was admitted status post ventricular fibrillation arrest.  Estimated downtime was 35 minutes.  Patient went for left heart catheter which found mid LAD lesion 100% stenosed, a drug-eluting stent was successfully placed.  There is also notes that a distal/apical LAD lesion was stenosed 80% and this was treated with PTCA.  Patient was placed on the hypothermic protocol.  Patient was rewarmed on 8/28 at 11 AM.  Was rewarmed at 0 200 on 05/20/2019.  Thus rewarmed for 57 hours at this time.  EEG was performed on 8/30.  It was suggestive of severe diffuse encephalopathy.  No seizures or definitive epileptiform discharges were seen throughout the recording.  Pressors were required however at this point they are weaning off pressors.  Fentanyl was DC'd on the 29th.  Neurology was asked to evaluate patient for prognostication    ROS: Unable to obtain due to altered mental status.   Past Medical History:  Diagnosis Date  . Anemia   . Anxiety   . Aortic valve stenosis   . Arthritis   . Asthma    per pt hx  . Dyspnea   . End stage renal disease on home HD 07/10/2011   Started HD in September 2012 at [facility] with a tunneled HD catheter, now on home HD with NxtStage. Dialyzing through AVF L lower arm with buttonhole technique as of mid 2014. His brother does the HD treatments at home.  They are roommates for 23 years.  The brother works 3rd shift and gets off about 8am and then puts Mr Jaquith on HD in the morning after getting home. Most of the time he does HD about 4 times a week, for about 4 hours per treatment. Cause of ESRD was HTN according to patient. He says he let his health go and ending up  with complications, and that he didn't like seeing doctors in those days.  He says he was diagnosed with severe HTN when he lived in [place] in his 4's.   . Hepatitis B carrier ([place])   . HIV infection ([place])   . Hypertension   . Hyperthyroidism    normal now  . Pneumonia several yrs ago  . Seizures ([place]) 06/02/2011   had a mild one in Sept. 2019  . Sleep apnea    to start Cpap soon  . Thrombocytopenia ([place])      Family History  Problem Relation Age of Onset  . Hypertension Mother   . Diabetes Mother   . Cancer Father    Social History:   reports that he quit smoking about 6 years ago. His smoking use included cigarettes. He quit after 10.00 years of use. He has never used smokeless tobacco. He reports that he does not drink alcohol or use drugs.  Medications  Current Facility-Administered Medications:  .   prismasol BGK 4/2.5 infusion, , CRRT, Continuous, Edrick Oh, MD, Last Rate: 500 mL/hr at 05/23/19 0354 .   prismasol BGK 4/2.5 infusion, , CRRT, Continuous, Edrick Oh, MD, Last Rate: 500 mL/hr at 05/23/19 0352 .  0.9 %  sodium chloride infusion, 250 mL, Intravenous, PRN, Jettie Booze, MD, Stopped at 05/23/19 [phone] .  acetaminophen (TYLENOL) solution 650 mg, 650 mg, Oral, Q6H  PRN, Kipp Brood, MD .  acetaminophen (TYLENOL) tablet 650 mg, 650 mg, Per Tube, Q4H PRN, Agarwala, Ravi, MD .  albuterol (PROVENTIL) (2.5 MG/3ML) 0.083% nebulizer solution 2.5 mg, 2.5 mg, Nebulization, Q6H PRN, Jettie Booze, MD .  artificial tears (LACRILUBE) ophthalmic ointment 1 application, 1 application, Both Eyes, Q8H, Jettie Booze, MD, 1 application at 28/36/62 0555 .  aspirin chewable tablet 81 mg, 81 mg, Per Tube, Daily, Agarwala, Ravi, MD, 81 mg at 05/23/19 0932 .  B-complex with vitamin C tablet 1 tablet, 1 tablet, Oral, Daily, Mannam, Praveen, MD, 1 tablet at 05/23/19 0930 .  bictegravir-emtricitabine-tenofovir AF (BIKTARVY) 50-200-25 MG per tablet 1 tablet, 1  tablet, Oral, Daily, Jettie Booze, MD, 1 tablet at 05/23/19 0930 .  ceFEPIme (MAXIPIME) 2 g in sodium chloride 0.9 % 100 mL IVPB, 2 g, Intravenous, Q12H, Lyndee Leo, RPH, Last Rate: 200 mL/hr at 05/23/19 1000 .  chlorhexidine gluconate (MEDLINE KIT) (PERIDEX) 0.12 % solution 15 mL, 15 mL, Mouth Rinse, BID, Agarwala, Ravi, MD, 15 mL at 05/23/19 0751 .  Chlorhexidine Gluconate Cloth 2 % PADS 6 each, 6 each, Topical, Daily, Kipp Brood, MD, 6 each at 05/22/19 0904 .  Darbepoetin Alfa (ARANESP) injection 60 mcg, 60 mcg, Intravenous, Once, Rosita Fire, MD, Stopped at 05/23/19 1006 .  feeding supplement (PRO-STAT SUGAR FREE 64) liquid 30 mL, 30 mL, Per Tube, Daily, Mannam, Praveen, MD, 30 mL at 05/23/19 0930 .  feeding supplement (VITAL AF 1.2 CAL) liquid 1,000 mL, 1,000 mL, Per Tube, Continuous, Mannam, Praveen, MD, Last Rate: 65 mL/hr at 05/23/19 0934, 1,000 mL at 05/23/19 0934 .  fentaNYL (SUBLIMAZE) injection 25-100 mcg, 25-100 mcg, Intravenous, Q2H PRN, Brand Males, MD, 100 mcg at 05/21/19 0138 .  ferric gluconate (NULECIT) 125 mg in sodium chloride 0.9 % 100 mL IVPB, 125 mg, Intravenous, Daily, Mannam, Praveen, MD, Last Rate: 110 mL/hr at 05/23/19 1028, 125 mg at 05/23/19 1028 .  heparin injection 1,000-6,000 Units, 1,000-6,000 Units, CRRT, PRN, Edrick Oh, MD, 3,200 Units at 05/22/19 1432 .  heparin injection 5,000 Units, 5,000 Units, Subcutaneous, Q8H, Mannam, Praveen, MD .  insulin aspart (novoLOG) injection 1-3 Units, 1-3 Units, Subcutaneous, Q4H, Agarwala, Ravi, MD, 1 Units at 05/23/19 0800 .  MEDLINE mouth rinse, 15 mL, Mouth Rinse, 10 times per day, Agarwala, Einar Grad, MD, 15 mL at 05/23/19 0932 .  metoprolol tartrate (LOPRESSOR) injection 2.5-5 mg, 2.5-5 mg, Intravenous, Q3H PRN, Mannam, Praveen, MD .  metoprolol tartrate (LOPRESSOR) tablet 12.5 mg, 12.5 mg, Oral, BID, Mannam, Praveen, MD .  midazolam PF (VERSED) injection 1-4 mg, 1-4 mg, Intravenous, Q2H PRN,  Chase Caller, Murali, MD .  ondansetron (ZOFRAN) injection 4 mg, 4 mg, Intravenous, Q6H PRN, Larae Grooms S, MD .  pantoprazole sodium (PROTONIX) 40 mg/20 mL oral suspension 40 mg, 40 mg, Per Tube, QHS, Ramaswamy, Belva Crome, MD, 40 mg at 05/22/19 2104 .  potassium PHOSPHATE 30 mmol in dextrose 5 % 500 mL infusion, 30 mmol, Intravenous, Once, Rosita Fire, MD, Last Rate: 85 mL/hr at 05/23/19 0957, 30 mmol at 05/23/19 0957 .  prismasol BGK 4/2.5 infusion, , CRRT, Continuous, Edrick Oh, MD, Last Rate: 2,000 mL/hr at 05/23/19 0904 .  sodium chloride 0.9 % primer fluid for CRRT, , CRRT, PRN, Edrick Oh, MD .  sodium chloride flush (NS) 0.9 % injection 3 mL, 3 mL, Intravenous, Q12H, Jettie Booze, MD, 3 mL at 05/23/19 0935 .  sodium chloride flush (NS) 0.9 % injection 3 mL, 3 mL,  Intravenous, PRN, Jettie Booze, MD .  ticagrelor [facility]) tablet 90 mg, 90 mg, Per Tube, BID, Kipp Brood, MD, 90 mg at 05/23/19 2458  Facility-Administered Medications Ordered in Other Encounters:  .  etomidate (AMIDATE) injection, , , Anesthesia Intra-op, Myna Bright, CRNA, 16 mg at 07/10/18 2031 .  rocuronium bromide 10 mg/mL (PF) syringe, , Intravenous, Anesthesia Intra-op, Myna Bright, CRNA, 50 mg at 07/10/18 2031   Exam: Current vital signs: BP 130/83 (BP Location: Right Arm)   Pulse 100   Temp 98.4 F (36.9 C) (Oral)   Resp 19   Ht 6' (1.829 m)   Wt 85.4 kg   SpO2 100%   BMI 25.53 kg/m  Vital signs in last 24 hours: Temp:  [98 F (36.7 C)-98.9 F (37.2 C)] 98.4 F (36.9 C) (09/01 1000) Pulse Rate:  [47-119] 100 (09/01 1000) Resp:  [17-27] 19 (09/01 1000) BP: (119-194)/(61-110) 130/83 (09/01 1000) SpO2:  [90 %-100 %] 100 % (09/01 1000) FiO2 (%):  [40 %] 40 % (09/01 1000) Weight:  [85.4 kg] 85.4 kg (09/01 0400)  Physical Exam  Constitutional: Appears well-developed and well-nourished.  Psych: Intubated Eyes: Scleral edema HENT: Intubated Head:  Normocephalic.  Cardiovascular: Normal rate and regular rhythm.  Respiratory: Intubated not breathing over the ventilator GI: Soft.  No distension. There is no tenderness.  Skin: WDI  Neuro: Mental Status: Patient does not respond to verbal stimuli.  Localizes with left arm to deep sternal rub.  Does not follow commands.  No verbalizations noted.  Intubated but breathing at 14 breaths/min which is what the ventilator is set at Cranial Nerves: II: patient does not respond confrontation bilaterally,  III,IV,VI: doll's response present bilaterally. pupils right 2 mm, left 2 mm,and reactive bilaterally V,VII: corneal reflex present bilaterally  VIII: patient does not respond to verbal stimuli IX,X: gag reflex present, XI: trapezius strength unable to test bilaterally XII: tongue strength unable to test Motor: Patient is able to withdraw from pain with the left arm.  On the right, he does withdrawal spontaneous and purposeful movement and localizing to noxious stimuli with left arm.  Spontaneous  movements noted. Sensory: As above  deep Tendon Reflexes:  2+ throughout upper extremity and knee jerk no ankle jerk Plantars: absent bilaterally Cerebellar: Unable to perform  Labs I have reviewed labs in epic and the results pertinent to this consultation are:   CBC    Component Value Date/Time   WBC 13.4 (H) 05/23/2019 0434   RBC 2.62 (L) 05/23/2019 0434   HGB 7.7 (L) 05/23/2019 0434   HGB 9.7 (L) 05/09/2018 0937   HGB 10.0 (L) 11/18/2016 1036   HCT 25.5 (L) 05/23/2019 0434   HCT 29.8 (L) 05/09/2018 0937   HCT 29.9 (L) 11/18/2016 1036   PLT 176 05/23/2019 0434   PLT 180 05/09/2018 0937   MCV 97.3 05/23/2019 0434   MCV 91 05/09/2018 0937   MCV 95.6 11/18/2016 1036   MCH 29.4 05/23/2019 0434   MCHC 30.2 05/23/2019 0434   RDW 18.2 (H) 05/23/2019 0434   RDW 16.9 (H) 05/09/2018 0937   RDW 18.1 (H) 11/18/2016 1036   LYMPHSABS 0.8 07/15/2018 0558   LYMPHSABS 1.2 11/18/2016 1036    MONOABS 0.4 07/15/2018 0558   MONOABS 0.6 11/18/2016 1036   EOSABS 0.9 (H) 07/15/2018 0558   EOSABS 0.3 11/18/2016 1036   BASOSABS 0.0 07/15/2018 0558   BASOSABS 0.1 11/18/2016 1036    CMP     Component Value Date/Time  NA 136 05/23/2019 0434   NA 136 11/18/2016 1036   K 4.2 05/23/2019 0434   K 3.3 (L) 11/18/2016 1036   CL 101 05/23/2019 0434   CO2 24 05/23/2019 0434   CO2 29 11/18/2016 1036   GLUCOSE 114 (H) 05/23/2019 0434   GLUCOSE 95 11/18/2016 1036   BUN 22 05/23/2019 0434   BUN 25.7 11/18/2016 1036   CREATININE 2.09 (H) 05/23/2019 0434   CREATININE 10.64 (H) 11/01/2017 1008   CREATININE 6.6 (HH) 11/18/2016 1036   CALCIUM 8.7 (L) 05/23/2019 0434   CALCIUM 9.5 11/18/2016 1036   PROT 6.7 05/20/2019 0350   PROT 7.4 11/18/2016 1036   PROT 7.9 11/18/2016 1036   ALBUMIN 2.8 (L) 05/23/2019 0434   ALBUMIN 3.9 11/18/2016 1036   AST 86 (H) 05/20/2019 0350   AST 15 11/18/2016 1036   ALT 43 05/20/2019 0350   ALT <6 11/18/2016 1036   ALKPHOS 87 05/20/2019 0350   ALKPHOS 68 11/18/2016 1036   BILITOT 0.4 05/20/2019 0350   BILITOT 0.69 11/18/2016 1036   GFRNONAA 32 (L) 05/23/2019 0434   GFRNONAA 3 (L) 02/10/2017 1619   GFRAA 37 (L) 05/23/2019 0434   GFRAA <4 (L) 02/10/2017 1619    Lipid Panel     Component Value Date/Time   CHOL 98 07/14/2018 0510   TRIG 139 05/23/2019 0434   HDL 35 (L) 07/14/2018 0510   CHOLHDL 2.8 07/14/2018 0510   VLDL 14 07/14/2018 0510   LDLCALC 49 07/14/2018 0510   LDLCALC 72 11/01/2017 1008     Imaging I have reviewed the images obtained:  CT-scan of the brain-obtained on 05/21/2019- this did not show any acute intracranial pathology  MRI examination of the brain- obtained 05/23/2019- exam shows patchy multifocal acute infarcts involving the bilateral cerebral and cerebellar hemispheres.  There is also associated petechial hemorrhage about 1 of these infarcts at the right parietal cortex.  Etta Quill PA-C [facility] [phone]  M-F  (9:00 am- 5:00 PM)  05/23/2019, 10:48 AM     Assessment:  Unfortunate 63 year old male with end-stage renal disease who had a ventricular fibrillation arrest with estimated downtime of 35 minutes with concurrent patchy multifocal acute infarcts involving bilateral cerebral and cerebellar hemispheres.  Currently he has been rewarmed for 57 hours.  Sedation was turned off approximately 3 days ago.    With localization, he does have signs of consciousness, however he has multiple embolic appearing infarcts.  I think that his current exam is likely a combination of the 2 etiologies.  From a anoxic standpoint, I think that he would potentially have a reasonable chance of recovery, but this coupled with the strokes we will need to see how he does over the next few days.  I suspect that he has had embolic phenomena that occurred likely around the time of his arrest.   Recommendations: - HgbA1c, fasting lipid panel - Frequent neuro checks - Prophylactic therapy-Antiplatelet med: Brilinta - Risk factor modification - Telemetry monitoring - PT consult, OT consult, Speech consult - Stroke team to follow  Roland Rack, MD [facility] [phone]  If 7pm- 7am, please page neurology on call as listed in [place].

## 2019-05-23 DEATH — deceased

## 2019-05-24 ENCOUNTER — Inpatient Hospital Stay (HOSPITAL_COMMUNITY): Payer: Medicare Other

## 2019-05-24 DIAGNOSIS — R570 Cardiogenic shock: Secondary | ICD-10-CM

## 2019-05-24 DIAGNOSIS — L8990 Pressure ulcer of unspecified site, unspecified stage: Secondary | ICD-10-CM | POA: Diagnosis not present

## 2019-05-24 DIAGNOSIS — I6340 Cerebral infarction due to embolism of unspecified cerebral artery: Secondary | ICD-10-CM | POA: Insufficient documentation

## 2019-05-24 DIAGNOSIS — I6310 Cerebral infarction due to embolism of unspecified precerebral artery: Secondary | ICD-10-CM

## 2019-05-24 DIAGNOSIS — G9340 Encephalopathy, unspecified: Secondary | ICD-10-CM

## 2019-05-24 LAB — RENAL FUNCTION PANEL
Albumin: 2.7 g/dL — ABNORMAL LOW (ref 3.5–5.0)
Anion gap: 13 (ref 5–15)
BUN: 19 mg/dL (ref 8–23)
CO2: 22 mmol/L (ref 22–32)
Calcium: 8.4 mg/dL — ABNORMAL LOW (ref 8.9–10.3)
Chloride: 102 mmol/L (ref 98–111)
Creatinine, Ser: 1.88 mg/dL — ABNORMAL HIGH (ref 0.61–1.24)
GFR calc Af Amer: 42 mL/min — ABNORMAL LOW (ref >60)
GFR calc non Af Amer: 37 mL/min — ABNORMAL LOW (ref >60)
Glucose, Bld: 108 mg/dL — ABNORMAL HIGH (ref 70–99)
Phosphorus: 1.6 mg/dL — ABNORMAL LOW (ref 2.5–4.6)
Potassium: 4 mmol/L (ref 3.5–5.1)
Sodium: 137 mmol/L (ref 135–145)

## 2019-05-24 LAB — CBC
HCT: 26.3 % — ABNORMAL LOW (ref 39.0–52.0)
Hemoglobin: 8 g/dL — ABNORMAL LOW (ref 13.0–17.0)
MCH: 29.9 pg (ref 26.0–34.0)
MCHC: 30.4 g/dL (ref 30.0–36.0)
MCV: 98.1 fL (ref 80.0–100.0)
Platelets: 174 K/uL (ref 150–400)
RBC: 2.68 MIL/uL — ABNORMAL LOW (ref 4.22–5.81)
RDW: 18.2 % — ABNORMAL HIGH (ref 11.5–15.5)
WBC: 13.2 K/uL — ABNORMAL HIGH (ref 4.0–10.5)
nRBC: 0.3 % — ABNORMAL HIGH (ref 0.0–0.2)

## 2019-05-24 LAB — GLUCOSE, CAPILLARY
Glucose-Capillary: 101 mg/dL — ABNORMAL HIGH (ref 70–99)
Glucose-Capillary: 101 mg/dL — ABNORMAL HIGH (ref 70–99)
Glucose-Capillary: 111 mg/dL — ABNORMAL HIGH (ref 70–99)
Glucose-Capillary: 114 mg/dL — ABNORMAL HIGH (ref 70–99)
Glucose-Capillary: 118 mg/dL — ABNORMAL HIGH (ref 70–99)

## 2019-05-24 LAB — HEMOGLOBIN A1C
Hgb A1c MFr Bld: 4.7 % — ABNORMAL LOW (ref 4.8–5.6)
Mean Plasma Glucose: 88.19 mg/dL

## 2019-05-24 LAB — LIPID PANEL
Cholesterol: 105 mg/dL (ref 0–200)
HDL: 27 mg/dL — ABNORMAL LOW (ref >40)
LDL Cholesterol: 43 mg/dL (ref 0–99)
Total CHOL/HDL Ratio: 3.9 RATIO
Triglycerides: 175 mg/dL — ABNORMAL HIGH (ref <150)
VLDL: 35 mg/dL (ref 0–40)

## 2019-05-24 LAB — MAGNESIUM: Magnesium: 2.4 mg/dL (ref 1.7–2.4)

## 2019-05-24 MED ORDER — SODIUM CHLORIDE 0.9 % IV SOLN: 1.0000 g | INTRAVENOUS | Status: DC

## 2019-05-24 MED ORDER — LORAZEPAM 2 MG/ML IJ SOLN
2.0000 mg | INTRAMUSCULAR | Status: AC
  Administered 2019-05-24: 2 mg via INTRAVENOUS
  Filled 2019-05-24: qty 1

## 2019-05-24 MED ORDER — VITAL AF 1.2 CAL PO LIQD: 1000.0000 mL | ORAL | Status: DC

## 2019-05-24 MED ORDER — METOPROLOL TARTRATE 12.5 MG HALF TABLET
12.5000 mg | ORAL_TABLET | Freq: Two times a day (BID) | ORAL | Status: DC
  Administered 2019-05-24: 12.5 mg
  Filled 2019-05-24: qty 1

## 2019-05-24 MED ORDER — B COMPLEX-C PO TABS
1.0000 | ORAL_TABLET | Freq: Every day | ORAL | Status: DC
  Administered 2019-05-24 – 2019-06-02 (×10): 1
  Filled 2019-05-24 (×10): qty 1

## 2019-05-24 MED ORDER — SODIUM CHLORIDE 0.9 % IV SOLN
300.0000 mg | INTRAVENOUS | Status: AC
  Administered 2019-05-24: 300 mg via INTRAVENOUS
  Filled 2019-05-24: qty 30

## 2019-05-24 MED ORDER — SODIUM CHLORIDE 0.9 % IV SOLN
1.0000 g | INTRAVENOUS | Status: AC
  Administered 2019-05-24 – 2019-05-27 (×4): 1 g via INTRAVENOUS
  Filled 2019-05-24 (×4): qty 1

## 2019-05-24 MED ORDER — NOREPINEPHRINE 4 MG/250ML-% IV SOLN
0.0000 ug/min | INTRAVENOUS | Status: DC
  Administered 2019-05-24: 20:00:00 2 ug/min via INTRAVENOUS
  Administered 2019-05-25: 8 ug/min via INTRAVENOUS
  Administered 2019-05-25: 7 ug/min via INTRAVENOUS
  Administered 2019-05-25: 8 ug/min via INTRAVENOUS
  Administered 2019-05-26: 5 ug/min via INTRAVENOUS
  Filled 2019-05-24 (×4): qty 250

## 2019-05-24 MED ORDER — ACETAMINOPHEN 160 MG/5ML PO SOLN
650.0000 mg | Freq: Four times a day (QID) | ORAL | Status: DC | PRN
  Administered 2019-05-26 – 2019-05-31 (×3): 650 mg
  Filled 2019-05-24 (×3): qty 20.3

## 2019-05-24 MED ORDER — VITAL AF 1.2 CAL PO LIQD
1000.0000 mL | ORAL | Status: DC
  Administered 2019-05-24 – 2019-05-25 (×2): 1000 mL

## 2019-05-24 MED ORDER — EMTRICITABINE-TENOFOVIR AF 200-25 MG PO TABS
1.0000 | ORAL_TABLET | Freq: Every day | ORAL | Status: DC
  Administered 2019-05-25 – 2019-06-02 (×9): 1
  Filled 2019-05-24 (×9): qty 1

## 2019-05-24 MED ORDER — LEVETIRACETAM IN NACL 1000 MG/100ML IV SOLN
1000.0000 mg | Freq: Once | INTRAVENOUS | Status: AC
  Administered 2019-05-24: 1000 mg via INTRAVENOUS
  Filled 2019-05-24: qty 100

## 2019-05-24 MED ORDER — SODIUM PHOSPHATES 45 MMOLE/15ML IV SOLN
30.0000 mmol | Freq: Once | INTRAVENOUS | Status: AC
  Administered 2019-05-24: 30 mmol via INTRAVENOUS
  Filled 2019-05-24: qty 10

## 2019-05-24 MED ORDER — NOREPINEPHRINE 4 MG/250ML-% IV SOLN
INTRAVENOUS | Status: AC
  Filled 2019-05-24: qty 250

## 2019-05-24 MED ORDER — SODIUM CHLORIDE 0.9 % IV SOLN
2.0000 g | Freq: Two times a day (BID) | INTRAVENOUS | Status: DC
  Filled 2019-05-24: qty 2

## 2019-05-24 MED ORDER — DOLUTEGRAVIR SODIUM 50 MG PO TABS
50.0000 mg | ORAL_TABLET | Freq: Every day | ORAL | Status: DC
  Administered 2019-05-25 – 2019-06-02 (×9): 50 mg via ORAL
  Filled 2019-05-24 (×9): qty 1

## 2019-05-24 NOTE — Progress Notes (Signed)
STROKE TEAM PROGRESS NOTE   INTERVAL HISTORY  I have personally reviewed history of presenting illness and electronic medical records as well as imaging films in PACS.  Patient suffered a ventricular fibrillation cardiac arrest with downtime of 35 minutes prior to return of spontaneous circulation.  He underwent emergent cardiac angioplasty and stent of the LAD and angioplasty of the apical segment and had induced hypothermia for 48 hours.  It has been 3-1/2 days since he was rewarmed yet he has not made significant neurologic improvement.  He is not sedated but intubated.  Eyes are open with forced right gaze deviation.  He does not follow any commands.  He has semipurposeful flexion response in the left upper extremity to noxious stimuli and trace withdrawal in both lower extremities.  Vitals:   05/24/19 0800 05/24/19 0830 05/24/19 0845 05/24/19 0900  BP: 99/75 115/79  108/77  Pulse: 99 98 96 95  Resp: (!) 23 (!) 23 (!) 23 (!) 21  Temp: 97.7 F (36.5 C)     TempSrc: Oral     SpO2: 100% 100% 100% 100%  Weight:      Height:        CBC:  Recent Labs  Lab 05/23/19 0434 05/24/19 0400  WBC 13.4* 13.2*  HGB 7.7* 8.0*  HCT 25.5* 26.3*  MCV 97.3 98.1  PLT 176 096    Basic Metabolic Panel:  Recent Labs  Lab 05/23/19 0434 05/23/19 1548 05/24/19 0400  NA 136 136 137  K 4.2 4.7 4.0  CL 101 100 102  CO2 24 24 22   GLUCOSE 114* 125* 108*  BUN 22 24* 19  CREATININE 2.09* 1.88* 1.88*  CALCIUM 8.7* 8.4* 8.4*  MG 2.5*  --  2.4  PHOS 1.6* 3.1 1.6*   Lipid Panel:     Component Value Date/Time   CHOL 105 05/24/2019 0400   TRIG 175 (H) 05/24/2019 0400   HDL 27 (L) 05/24/2019 0400   CHOLHDL 3.9 05/24/2019 0400   VLDL 35 05/24/2019 0400   LDLCALC 43 05/24/2019 0400   LDLCALC 72 11/01/2017 1008   HgbA1c:  Lab Results  Component Value Date   HGBA1C 4.7 (L) 05/24/2019   Urine Drug Screen:     Component Value Date/Time   LABOPIA NONE DETECTED 06/02/2011 0948   COCAINSCRNUR  POSITIVE (A) 06/02/2011 0948   LABBENZ NONE DETECTED 06/02/2011 0948   AMPHETMU NONE DETECTED 06/02/2011 0948   THCU NONE DETECTED 06/02/2011 0948   LABBARB NONE DETECTED 06/02/2011 0948    Alcohol Level     Component Value Date/Time   ETH <11 06/02/2011 0953    IMAGING Ct Head Wo Contrast  Result Date: 05/22/2019 CLINICAL DATA:  Encephalopathy EXAM: CT HEAD WITHOUT CONTRAST TECHNIQUE: Contiguous axial images were obtained from the base of the skull through the vertex without intravenous contrast. COMPARISON:  07/11/2018 FINDINGS: Brain: No evidence of acute infarction, hemorrhage, hydrocephalus, extra-axial collection or mass lesion/mass effect. Periventricular white matter hypodensity. Vascular: No hyperdense vessel or unexpected calcification. Skull: Normal. Negative for fracture or focal lesion. Sinuses/Orbits: No acute finding. Other: None. IMPRESSION: No acute intracranial pathology.  Small-vessel white matter disease. Electronically Signed   By: Eddie Candle M.D.   On: 05/22/2019 15:31   Mr Brain Wo Contrast  Result Date: 05/23/2019 CLINICAL DATA:  Initial evaluation for acute altered mental status. EXAM: MRI HEAD WITHOUT CONTRAST TECHNIQUE: Multiplanar, multiecho pulse sequences of the brain and surrounding structures were obtained without intravenous contrast. COMPARISON:  Prior CT from 05/22/2019. FINDINGS:  Brain: Examination severely degraded by motion artifact. Diffuse prominence of the CSF containing spaces compatible with generalized age-related cerebral atrophy. Patchy and confluent T2/FLAIR hyperintensity within the periventricular and deep white matter both cerebral hemispheres most consistent with chronic small vessel ischemic disease, moderate to advanced in nature. Superimposed remote lacunar infarct present at the left thalamus. Scattered multifocal foci of restricted diffusion seen involving the bilateral cerebral and cerebellar hemispheres, consistent with acute ischemic  infarcts. The largest foci within the left cerebral hemisphere seen within the left occipital lobe in measures approximately 16 mm (series 5, image 61). Largest focus within the right cerebral hemisphere position within the right parietal cortex and measures 11 mm (series 5, image 80). Associated petechial hemorrhage about this infarct (series 15, image 18). No frank hemorrhagic transformation. Right greater than left cerebellar infarcts present, largest of which measures 16 mm on the right (series 5, image 51). Single punctate subcentimeter infarct noted involving the superior left thalamus (series 5, image 73). Deep gray nuclei and brainstem are otherwise spared. No other evidence for associated hemorrhage. No associated mass effect. Findings likely due to a central thromboembolic etiology. No mass lesion, midline shift, or visible mass effect. Ventricles normal size without hydrocephalus. No extra-axial fluid collection. Pituitary gland suprasellar region within normal limits. Midline structures intact and normal. Vascular: Diminished flow void within the left V4 segment, which could be related to slow flow and/or occlusion (series 7, image 2). Major intracranial vascular flow voids otherwise maintained. Skull and upper cervical spine: Craniocervical junction within normal limits. Bone marrow signal intensity normal. No scalp soft tissue abnormality. Sinuses/Orbits: Globes and orbital soft tissues grossly within normal limits. Patient status post ocular lens replacement on the left. Paranasal sinuses are largely clear. Right greater than left mastoid effusions noted. Inner ear structures grossly normal. Other: None. IMPRESSION: 1. Technically limited exam due to extensive motion artifact. 2. Patchy multifocal acute ischemic infarcts involving the bilateral cerebral and cerebellar hemispheres as above. Given the various vascular distributions involved, a central thromboembolic etiology suspected. 3. Associated  petechial hemorrhage about 1 of these infarcts at the right parietal cortex as above. No other evidence for associated hemorrhage. No frank hemorrhagic transformation or significant mass effect. 4. Underlying atrophy with moderate chronic microvascular ischemic disease. Electronically Signed   By: Jeannine Boga M.D.   On: 05/23/2019 02:10   Dg Chest Port 1 View  Result Date: 05/23/2019 CLINICAL DATA:  Acute respiratory failure.  End-stage renal disease. EXAM: PORTABLE CHEST 1 VIEW COMPARISON:  One-view chest x-ray 05/20/2019 FINDINGS: Endotracheal tube terminates 5 cm above the carina. NG tube courses off the inferior border the film. A left IJ line extends horizontally and may be in the azygos. The heart is enlarged. Pulmonary vascular congestion has increased. No significant airspace consolidation is present. IMPRESSION: 1. Cardiomegaly and increasing pulmonary vascular congestion. 2. Support apparatus is stable. Electronically Signed   By: San Morelle M.D.   On: 05/23/2019 07:57    PHYSICAL EXAM Middle-aged African-American male who is intubated but not sedated. . Afebrile. Head is nontraumatic. Neck is supple without bruit.    Cardiac exam no murmur or gallop. Lungs are clear to auscultation. Distal pulses are well felt. Neurological Exam : Patient is intubated.  Not sedated.  Eyes open.  He has forced right gaze deviation.  He has intermittent roving side-to-side eye movements.  He does not blink to threat on either side.  He does not follow any commands.  Pupils are equal reactive.  Fundi  not visualized.  Corneal reflexes are present bilaterally.  He has a cough and gag.  Patient has intermittent spontaneous yawning.  He has respiratory effort about ventilator setting.  No spontaneous extremity movements.  He has semipurposeful flexion response to sternal rub in the left upper extremity none on the right.  He has trace withdrawal in both lower extremities to noxious  stimuli. ASSESSMENT/PLAN Jeffrey [name] is a 63 y.o. male with history of thrombocytopenia, sleep apnea, seizures, hypertension, HIV, hepatitis B, end-stage renal disease, aortic valve stenosis admitted 05/19/2019 post VFib arrest down 35 mins s/p PTCA, DES LAD. Cooled. Rewarmed. EEG w/ diffuse encephalopathy. Neuro consulted for prognostication.   Stroke:   Patchy B anterior and posterior territory infarct embolic post VFib arrest with AF also with underlying Anoxic brain injury post arrest  CT head No acute abnormality. Small vessel disease.   MRI  Patchy multifocal infarct B cerebral and cerebellar hemispheres. Petechial hemorrhage R parietal cortex. Small vessel disease. Atrophy.   2D Echo EF 45-50%. AF. LA and RA dilated. Bioprosthetic AV. No source of embolus   LDL 43  HgbA1c 4.7  Heparin 5000 units sq tid for VTE prophylaxis  aspirin 81 mg daily prior to admission, now on aspirin 81 mg daily and Brilinta (ticagrelor) 90 mg bid.   Therapy recommendations:  SNF  Disposition:  pending   Acute Respiratory Failure  Intubated  Hypotension Cardiogenic shock . Now off pressor . BP stable  Dysphagia . NPO  Other Stroke Risk Factors  Advanced age  Former Cigarette smoker, quit 6 yrs ago  Coronary artery disease s/p PTCA, DES LAD this admission w/ cardiogenic shock now resolved  Obstructive sleep apnea  HIV  AV stenosis s/p bioprosthetic replacement  Cardiomegaly   Other Active Problems  Thrombocytopenia    Seizure d/o on vimpat   Hepatitis B  ESRD on home HD, on CRRT  Tracheobronchitis vs PNA  Hospital day # 6  I have personally obtained history,examined this patient, reviewed notes, independently viewed imaging studies, participated in medical decision making and plan of care.ROS completed by me personally and pertinent positives fully documented  I have made any additions or clarifications directly to the above note.  Patient clearly has a poor  neurological prognosis due to combination of anoxic brain injury from his cardiac arrest as well as multiple by cerebral embolic infarcts likely from atrial fibrillation or cardiac procedure.  He is now 5 to 6 days out from the event and appears to be minimally conscious without much purposeful activity.  Recommend continue supportive care with ventilatory support and hemodynamic support for the next 2 to 3 days and unless there is meaningful recovery patient will likely be left in vegetative state requiring prolonged ventilatory support and 24-hour nursing care.  I had a long discussion with the patient's brother and sister at the bedside as well as with Dr. Elsworth Soho critical care medicine.  Family states that patient had clearly expressed to them his desire that he would not want to be kept alive in a vegetative state and are hopeful that he make some meaningful recovery over the next few days.Recommend check EEG today and repeat CT head without contrast tomorrow morning. This patient is critically ill and at significant risk of neurological worsening, death and care requires constant monitoring of vital signs, hemodynamics,respiratory and cardiac monitoring, extensive review of multiple databases, frequent neurological assessment, discussion with family, other specialists and medical decision making of high complexity.I have made any additions or  clarifications directly to the above note.This critical care time does not reflect procedure time, or teaching time or supervisory time of PA/NP/Med Resident etc but could involve care discussion time.  I spent 40 minutes of neurocritical care time  in the care of  this patient.     Antony Contras, MD Medical Director [facility] Pager: [phone] 05/24/2019 2:31 PM   To contact Stroke Continuity provider, please refer to [email or web address] After hours, contact General Neurology

## 2019-05-24 NOTE — Progress Notes (Signed)
EEG complete - results pending 

## 2019-05-24 NOTE — Progress Notes (Signed)
Pharmacy Antibiotic Note  Jeffrey [name] is a 63 y.o. male admitted on 05/11/2019 with sepsis.  Pharmacy has been consulted for cefepime dosing.  Patient s/p vfib arrest 8/27, he is ESRD prior to admit, currently receiving CRRT therapy, will adjust antibiotics accordingly.   Pharmacy asked to change cefepime to ceftazidime due to suspicion for neurotoxicity from cefepime.  Penicillin allergic, but tolerating cefepime so hopefully able to tolerate ceftazidime.  CRRT was stopped today.    Plan: Ceftazidime 1g q 24 hrs for now.  Will need to follow renal function closely to assess for proper dosing now that CRRT off.  Height: 6' (182.9 cm) Weight: 184 lb 15.5 oz (83.9 kg) IBW/kg (Calculated) : 77.6  Temp (24hrs), Avg:98.1 F (36.7 C), Min:97.6 F (36.4 C), Max:98.4 F (36.9 C)  Recent Labs  Lab 04/28/2019 0648  05/22/19 0415 05/22/19 0956 05/22/19 1347 05/22/19 1722 05/22/19 1730 05/23/19 0434 05/23/19 1548 05/24/19 0400  WBC  --    < > 15.1* 16.3*  --   --  13.8* 13.4*  --  13.2*  CREATININE 14.20*   < > 2.12*  --   --   --  2.09* 2.09* 1.88* 1.88*  LATICACIDVEN 6.6*  --   --   --  1.2 0.9  --   --   --   --    < > = values in this interval not displayed.    Estimated Creatinine Clearance: 43 mL/min (A) (by C-G formula based on SCr of 1.88 mg/dL (H)).    Allergies  Allergen Reactions  . Lisinopril Anaphylaxis and Shortness Of Breath    Throat swelling  . Penicillins Anaphylaxis and Other (See Comments)    Childhood allergy Has patient had a PCN reaction causing immediate rash, facial/tongue/throat swelling, SOB or lightheadedness with hypotension: Yes Has patient had a PCN reaction causing severe rash involving mucus membranes or skin necrosis: Unk Has patient had a PCN reaction that required hospitalization: Unk Has patient had a PCN reaction occurring within the last 10 years: No If all of the above answers are "NO", then may proceed with Cephalosporin use.   .  Fentanyl Other (See Comments)    Lethargy, AMS  . Morphine And Related Other (See Comments)    "Not Himself"    Thank you for allowing pharmacy to be a part of this patient's care.  Marguerite Olea, [facility] Clinical Pharmacist Phone [phone]  05/24/2019 5:41 PM

## 2019-05-24 NOTE — Progress Notes (Signed)
[facility] Progress Note Patient Name: Jeffrey [name] DOB: Apr 26, 1956 MRN: [id number]   Date of Service  05/24/2019  HPI/Events of Note  Notified of low phos.  PT is on CRRT. Phos 1.6, K 4.0  eICU Interventions  NaPhos ordered.     Intervention Category Minor Interventions: Electrolytes abnormality - evaluation and management  Elsie Lincoln 05/24/2019, 6:37 AM

## 2019-05-24 NOTE — Evaluation (Signed)
Occupational Therapy Evaluation Patient Details Name: Jeffrey [name] MRN: [id number] DOB: 08-Sep-1956 Today's Date: 05/24/2019    History of Present Illness Pt is a 63 y/o male with end-stage renal disease on home dialysis, aortic valve endocarditis (valve replacement 2019), HIV, hepatitis B, PVD, CAD, admitted 8/27 status post VF arrest estimated time to ROSC 35 minutes. 8/29 L heart cath. 8/29 EEG negative for seizure, reports sevre diffuse encephalopathy, on CRRT.  MRI 8/31 reveals patchy mild to focal ischemic infarcts with petechial hemorrhage, underlying atrophy with chronic microvascular ischemic disease.   Clinical Impression   Patient seen at bed level.  Admitted for above and limited by problem list below.  Patient unable to follow commands, has R gaze preference (spontanously turns head and moves gaze to midline but not past) and blinks to threat, does not respond to noxious stimuli with R UE but does with L UE (flexion withdrawl).  Resistive to PROM on L UE (flexion withdrawal) but PROM on R UE WFL.  Limited eval, vented and on CRRT (with L femoral line) therefore remained supine in bed.  He will benefit from continued OT services while admitted and after dc at SNF level.  Will follow acutely.     Follow Up Recommendations  SNF;Supervision/Assistance - 24 hour    Equipment Recommendations  Other (comment)(TBD)    Recommendations for Other Services       Precautions / Restrictions Precautions Precautions: Other (comment) Precaution Comments: L femoral HD line  Restrictions Weight Bearing Restrictions: No      Mobility Bed Mobility               General bed mobility comments: NT- supine in bed: vent and CRRT  Transfers                 General transfer comment: NT     Balance Overall balance assessment: (NT)                                         ADL either performed or assessed with clinical judgement   ADL Overall ADL's : Needs  assistance/impaired                                       General ADL Comments: requires total assist for all ADLs at this time, bed level only      Vision   Vision Assessment?: Vision impaired- to be further tested in functional context Additional Comments: R gaze, spontanous gaze to midline but not past; blinks to threat only     Perception     Praxis      Pertinent Vitals/Pain Pain Assessment: Faces Faces Pain Scale: No hurt     Hand Dominance     Extremity/Trunk Assessment Upper Extremity Assessment Upper Extremity Assessment: RUE deficits/detail;LUE deficits/detail RUE Deficits / Details: PROM WFL, no purposeful movements  RUE Sensation: (does not respond to noxious stimuli) RUE Coordination: decreased fine motor;decreased gross motor LUE Deficits / Details: patient purposefully resisting movement, flexor withdrawl  LUE Sensation: (withdrawls from noxious stimuli ) LUE Coordination: decreased fine motor;decreased gross motor   Lower Extremity Assessment Lower Extremity Assessment: Defer to PT evaluation       Communication Communication Communication: Other (comment)(vented)   Cognition Arousal/Alertness: Awake/alert Behavior During Therapy: Flat affect Overall Cognitive Status: Impaired/Different  from baseline Area of Impairment: [place] Recovery Scale Auditory: Auditory Startle Visual: Visual Startle Motor: Flexion Withdrawl Oromotor/Verbal: Vocalization/Oral Movement Communication: None Arousal: Eye opening without stimulation Total Score: 8                 General Comments: patient not following commands    General Comments  VSS    Exercises     Shoulder Instructions      Home Living Family/patient expects to be discharged to:: Private residence Living Arrangements: Other relatives Available Help at Discharge: Family;Available PRN/intermittently Type of Home: House(townhome ) Home Access: Stairs to enter     Home  Layout: Two level;Able to live on main level with bedroom/bathroom     Bathroom Shower/Tub: Occupational psychologist: Standard     Home Equipment: Environmental consultant - 2 wheels;Walker - 4 wheels;Cane - quad   Additional Comments: home hemodialysis --info above per chart review, pt unable to report information      Prior Functioning/Environment          Comments: pt unable to report, per chart required assist        OT Problem List: Decreased strength;Decreased range of motion;Decreased activity tolerance;Impaired balance (sitting and/or standing);Impaired vision/perception;Decreased coordination;Decreased cognition;Decreased safety awareness;Decreased knowledge of use of DME or AE;Decreased knowledge of precautions;Impaired sensation;Impaired UE functional use      OT Treatment/Interventions: Self-care/ADL training;DME and/or AE instruction;Neuromuscular education;Therapeutic activities;Cognitive remediation/compensation;Visual/perceptual remediation/compensation;Patient/family education;Balance training    OT Goals(Current goals can be found in the care plan section) Acute Rehab OT Goals Patient Stated Goal: none stated OT Goal Formulation: Patient unable to participate in goal setting Time For Goal Achievement: 06/07/19 Potential to Achieve Goals: Fair  OT Frequency: Min 2X/week   Barriers to D/C:            Co-evaluation PT/OT/SLP Co-Evaluation/Treatment: Yes Reason for Co-Treatment: Complexity of the patient's impairments (multi-system involvement)   OT goals addressed during session: ADL's and self-care      AM-PAC OT "6 Clicks" Daily Activity     Outcome Measure Help from another person eating meals?: Total Help from another person taking care of personal grooming?: Total Help from another person toileting, which includes using toliet, bedpan, or urinal?: Total Help from another person bathing (including washing, rinsing, drying)?: Total Help from another person  to put on and taking off regular upper body clothing?: Total Help from another person to put on and taking off regular lower body clothing?: Total 6 Click Score: 6   End of Session Nurse Communication: Mobility status;Precautions  Activity Tolerance: No increased pain Patient left: in bed;with call bell/phone within reach;with bed alarm set;with nursing/sitter in room  OT Visit Diagnosis: Other abnormalities of gait and mobility (R26.89);Other symptoms and signs involving cognitive function;Other symptoms and signs involving the nervous system (R29.898)                Time: 4888-9169 OT Time Calculation (min): 25 min Charges:  OT General Charges $OT Visit: 1 Visit OT Evaluation $OT Eval High Complexity: 1 High  Delight Stare, OT Acute Rehabilitation Services Pager [phone] Office [phone]   Delight Stare 05/24/2019, 10:44 AM

## 2019-05-24 NOTE — Progress Notes (Signed)
SLP Cancellation Note  Patient Details Name: Jeffrey [name] MRN: [id number] DOB: 09-22-1955   Cancelled treatment:       Reason Eval/Treat Not Completed: Medical issues which prohibited therapy - currently on the vent. Will f/u for readiness.    Venita Sheffield Lamir Racca 05/24/2019, 10:28 AM  Pollyann Glen, M.A. [facility] Acute Environmental education officer [phone] Office [phone]

## 2019-05-24 NOTE — Progress Notes (Signed)
Frequent sharply contoured generalized discharges with triphasic morphology. ? Irritability vs cefepime neurotoxicity.   Will give ativan challenge and start keppra, LTM EEG for response.   Roland Rack, MD [facility] [phone]  If 7pm- 7am, please page neurology on call as listed in [place].

## 2019-05-24 NOTE — Progress Notes (Signed)
63 year old man with HIV, hep B, ESRD on home dialysis admitted 8/27 with VF arrest but prolonged downtime.  He underwent PTCA to distal LAD and hypothermia protocol. Cardiogenic shock has resolved.  He remains critically ill, on CRRT, unresponsive, does not move right upper extremity has some withdrawal both lower extremities and almost localizes mild with left upper extremity to deep painful stimulus, has rightward gaze , decreased breath sounds bilateral, no rub, S1-S2 normal, soft mildly distended abdomen.  Respiratory culture has shown Serratia and Pseudomonas. Chest x-ray 9/1 personally reviewed which shows cardiomegaly and increasing pulmonary vascular congestion.  Labs reviewed which show normal electrolytes, low phosphorus, mild stable leukocytosis and anemia.  Impression/plan   Acute encephalopathy-awaiting neuro prognostication but exam today does suggest a poor prognosis.  Appears to be a combination of anoxic damage and embolic CVAs noted on MRI  Cardiogenic shock-has resolved PTCA to LAD, continue DAPT  ESRD-can transition from CRRT to intermittent dialysis since blood pressure is improved.  Ct  cefepime for Pseudomonas/Serratia tracheobronchitis versus pneumonia.  Acute respiratory failure-he tolerates pressure support weaning but does not have the mental status to extubate.  Based on neuro prognostication, will continue family discussion regarding goals of care here  The patient is critically ill with multiple organ systems failure and requires high complexity decision making for assessment and support, frequent evaluation and titration of therapies, application of advanced monitoring technologies and extensive interpretation of multiple databases. Critical Care Time devoted to patient care services described in this note independent of APP/resident  time is 35 minutes.   Leanna Sato Elsworth Soho MD

## 2019-05-24 NOTE — Progress Notes (Signed)
Nutrition Follow-up  DOCUMENTATION CODES:   Not applicable  INTERVENTION:   -Continue b-complex with Vitamin C  Tube feeding:  -Vital AF 1.2 @ 40 ml/hr via NGT -Increase per toleration to goal rate of 65 ml/hr (1560 ml) -30 ml Prostat once daily  At goal TF provides: 1972 kcals, 132 grams protein, 1265 ml free water. Meets 104% kcal need and 100% of protein needs.   NUTRITION DIAGNOSIS:   Increased nutrient needs related to post-op healing as evidenced by estimated needs.  Ongoing  GOAL:   Patient will meet greater than or equal to 90% of their needs  Addressed via TF  MONITOR:   Diet advancement, Vent status, Labs, Weight trends, TF tolerance, Skin, I & O's  REASON FOR ASSESSMENT:   Ventilator    ASSESSMENT:   Patient with PMH significant for ESRD on home dialysis, AVS, Hepatitis B, HIV, HTN, and seizures. Presents this admission with VF arrest in setting of severe CAD.   8/27- s/p L heart cath 8/28- R IJ cath placed, start CRRT  9/2- CRRT stopped   RD working remotely.  Mental status barrier to extubation. Off pressors/sedation. Off CRRT- HD when needed. Phosphorus to be replaced today. Pt had episode of emesis last night- Vital AF turned down to 40 ml/hr. Plan to increase per toleration to goal rate of 65 ml/hr.   EDW per nephrology: 79 kg  Admission weight: 87 kg  Current weight 83.9 kg   Patient remains intubated on ventilator support MV: 12.5 L/min Temp (24hrs), Avg:98 F (36.7 C), Min:97.6 F (36.4 C), Max:98.4 F (36.9 C)   I/O: -545 ml since admit CRRT: 1,205 ml x 24 hrs   Medications: b complex with vit C, SS novolog Labs: Phosphorus 1.6 (L) CBG 40-127  Diet Order:   Diet Order            Diet NPO time specified  Diet effective now              EDUCATION NEEDS:   Not appropriate for education at this time  Skin:  Skin Assessment: Skin Integrity Issues: Skin Integrity Issues:: DTI DTI: R head  Last BM:  9/1  Height:    Ht Readings from Last 1 Encounters:  05/12/2019 6' (1.829 m)    Weight:   Wt Readings from Last 1 Encounters:  05/24/19 83.9 kg    Ideal Body Weight:  80.9 kg  BMI:  Body mass index is 25.09 kg/m.  Estimated Nutritional Needs:   Kcal:  1900 kcal  Protein:  130-150 grams  Fluid:  1000 ml + UOP   Mariana Single RD, LDN Clinical Nutrition Pager # [phone]

## 2019-05-24 NOTE — Progress Notes (Signed)
NAME:  Jeffrey [name], MRN:  [id number], DOB:  19-Jul-1956, LOS: 6 ADMISSION DATE:  05/22/2019, CONSULTATION DATE:  8/27REFERRING MD:  cardama, CHIEF COMPLAINT:  Cardiac arrest   Brief History   63 year old male patient with end-stage renal disease on home dialysis admitted 8/27 status post VF arrest estimated time to ROSC 35 minutes.  Past Medical History  Prior aortic valve insufficiency, status post aortic valve endocarditis requiring aortic valve replacement 2019 Known diffuse nonobstructive coronary artery disease HIV Hepatitis B End-stage renal disease on home dialysis Hypothyroidism Peripheral vascular disease Prior GI bleed Coronary artery disease Prior left VATS for fibrothorax following bacteremia in February 2020 Baseline PFT Nov 2019: Fev1 0.95L/29%, DLCO 12.67/36%  Significant Hospital Events   8/27: Admitted status post VF arrest.  Estimated downtime 35 minutes.  Went for left heart cath, findings:Mid LAD lesion 100% stenosis, drug-eluting stent was successfully placed.  A distal/apical LAD lesion was stenosed at 80% and this was treated with PTCA.  He had nonobstructive disease involving the circumflex and RCA.  Hypothermia protocol initiated at: 1104 am temp goal reached  8/28 start re-warming at 11 am. Still pressor dependent. Left IJ site oozing required extra suture. EEg neg for seizure. Amiodarone stopped d/t bradycardia. Sending sputum and PCTs. Placing HD cath for CRRT. amio stopped overnight. Hypotensive   8/29  off levophed. On vent 40% fi02. Rewarmed at 3.30 am. Off sedation. Has cough and opened eyes but not following commands  8/30 Started on heparin gtt due to EKG changes of ischemia  8/31 Heparin drip stopped  9/2 CRRT stopped  Consults:  Cardiology  Neurology Nephrology  Procedures:  Oett 8/27>>> Left IJ CVL 8/27>>> Lt femoral HD cath 8/28>>>  Significant Diagnostic Tests:  Left heart cath 8/28: Mid LAD lesion 100% stenosis, drug-eluting stent  was successfully placed.  A distal/apical LAD lesion was stenosed at 80% and this was treated with PTCA.  He had nonobstructive disease involving the circumflex and RCA.  Echo 8/27: LVEF 45-50% with left ventricular wall dyskinesis. In afib at time of exam  EEG 8/27>>>no seizure  EEG 8/30-severe diffuse encephalopathy  CT head 8/31- small vessel white matter disease MRI 9/1- Patchy multifocal acute ischemic infarcts involving the bilateral cerebral and cerebellar hemispheres--suspicious for central thromboembolic etiology. Petechial hemorrhage about 1 of these infarcts at the right parietal cortex. Underlying atrophy with moderate chronic microvascular disease.  CXR 9/1-worsening pulm vasc congestion  Micro Data:  Corona virus 8/27: neg MRSA PCR 8/.27 - neg Respiratory culture 8/28>>> Serratia, Pseudomonas  Antimicrobials:  Cefepime 8/29 >.  Interim history/subjective:  1 episode of emesis last PM. Feeds at 65. Mental status continues to be poor. Intubated. Off sedation.   Objective   Blood pressure 99/75, pulse 99, temperature 97.7 F (36.5 C), temperature source Oral, resp. rate (!) 23, height 6' (1.829 m), weight 83.9 kg, SpO2 100 %. CVP:  [7 mmHg] 7 mmHg  Vent Mode: PSV;CPAP FiO2 (%):  [40 %] 40 % Set Rate:  [14 bmp] 14 bmp Vt Set:  [620 mL] 620 mL PEEP:  [5 cmH20] 5 cmH20 Pressure Support:  [10 cmH20] 10 cmH20 Plateau Pressure:  [14 cmH20-24 cmH20] 20 cmH20   Intake/Output Summary (Last 24 hours) at 05/24/2019 0849 Last data filed at 05/24/2019 0800 Gross per 24 hour  Intake 2677.98 ml  Output 3501 ml  Net -823.02 ml   Filed Weights   05/22/19 0400 05/23/19 0400 05/24/19 0500  Weight: 85.9 kg 85.4 kg 83.9 kg   Gen:  No acute distress. Intubated.  HEENT:  EOMI, sclera anicteric, ET tube Neck:     No masses; no thyromegaly Lungs:    Clear to auscultation bilaterally; normal respiratory effort. Breathing over vent intermittently CV:         Regular rate and  rhythm; no murmurs Abd:      + bowel sounds; soft, non-tender; no palpable masses, mildly distended Ext:    No edema; adequate peripheral perfusion Skin:      Warm and dry; no rash Neuro: Unresponsive off sedation. Left sized cortical posturing to noxious stimuli. Gaze deviated to right. Not following commands.   Resolved Hospital Problem list     Assessment & Plan:  Known history of coronary artery disease with prior CABG and aortic valve replacement VF arrest 05/06/2019  in setting of severe CAD, now s/p DES to mid LAD and PTCA to distal/apical LAD lesion.  Amiodarone stopped d/t bradycardia (8/29), heparin started 8/30-8/31 Telemetry monitoring Cont uninterrupted dual platelet therapy for at least 12 months. On brilinta Per cardiology there is no role for repeat invasive cardiac procedure--continue medical management  Circulatory shock.  Bps stable. Maintaining MAPs in 80s New CM w/ EF 45-50% and mod dyskinesis Weaned off pressors, Lopressor started 05/23/19  Acute respiratory failure s/p cardiopulm arrest.  Vent settings: FiO2 40%, PEEP 5 Continue pressure support weans.  Mental status is a barrier to extubation. Will need to consider trach/peg if unable to wean  Acute encephalopathy MRI reviewed with multiple embolic infarcts Sedation off since 8/29 Spontaneous LUE movements--not appearing purposeful Neurology consulted 9/1: suspect embolic event occurred around time of cardiac arrest. Will continue to follow   End-stage renal disease w/ residual anion gap metabolic acidosis (4/96 lactic acid was 6.6-->resolved 8/31). On home dialysis Nephrology recs: 9/2-discontinue CRRT and will continue to assess needs for dialysis   Anemia of chronic disease. Likely attributable to CKD, anemia of critical illness superimposed on ACD. Improving. 7.7-->8.0 Status post 1 unit PRBC on 8/31 for hgb 6.6. Aranesp 9/1 Plan Follow CBC Iron supplementation  Known HIV and HepB. On biktarvy  Pseudomonas, Serratia pneumonia Cefepime started 8/29--continue  Hyperglycemia. One episode of hypoglycemia 9/2 am. A1C 4.7.  Plan Monitor sugars  SSI  Best practice:  Diet: Tube feeds. Turned down: 65-->40 due to episode of emesis last PM. Will continue to titrate up as able Pain/Anxiety/Delirium protocol (if indicated): Off sedation VAP protocol (if indicated): Ordered DVT prophylaxis: [place] heparin  GI prophylaxis: Protonix Glucose control:Monitor Mobility: BR Code Status: Full code Family Communication: PCCM physician discussed at bedside with patient's brother and sister who just arrived from [place].  Reviewed code status.  They want to continue full code for now while awaiting for neurology assessment. Disposition: ICU  ATTESTATION & SIGNATURE   The patient is critically ill with multiple organ system failure and requires high complexity decision making for assessment and support, frequent evaluation and titration of therapies, advanced monitoring, review of radiographic studies and interpretation of complex data.   Critical Care Time devoted to patient care services, exclusive of separately billable procedures, described in this note is 35 minutes.   [facility] Internal Medicine Resident PGY1 05/24/2019, 8:49 AM

## 2019-05-24 NOTE — Progress Notes (Signed)
vLTM started  Neurology notified

## 2019-05-24 NOTE — Progress Notes (Signed)
Referred by nurse to see Jeffrey [name] family.  Introduced myself to Jeffrey [name]'s brother.  Jeffrey [name] sister was also in the room.  He did not seem to need any pastoral care at the time, but let Jeffrey [name]'s brother know that he could ask for a chaplain at any time.  Please page me as needed [phone])

## 2019-05-24 NOTE — Progress Notes (Signed)
RT note: patient placed on CPAP/PSV of 10/5 at 0750.

## 2019-05-24 NOTE — Procedures (Addendum)
Patient Name: Jeffrey [name]  MRN: [id number]  Epilepsy Attending: Lora Havens  Referring Physician/Provider: Brand Males Date: 05/24/2019  Duration: 25.08 mins   Patient history: 63yo M with cardiac arrest, now on TTM. EEG to evaluate for seizures.  Level of alertness: comatose   AEDs during EEG study: none  Technical aspects: This EEG study was done with scalp electrodes positioned according to the 10-20 International system of electrode placement. Electrical activity was acquired at a sampling rate of 500Hz  and reviewed with a high frequency filter of 70Hz  and a low frequency filter of 1Hz . EEG data were recorded continuously and digitally stored.   DESCRIPTION: EEG showed continuous generalized 2-3Hz  delta slowing. There were also 1.5-2Hz  triphasic waves, generalized, maximal bifrontal, which at times appear sharply contoured with overriding fast activity EEG was reactive to tactile stimulation. Hyperventilation and photic stimulation were not performed.  IMPRESSION:  This study is suggestive of severe diffuse encephalopathy. There were also some sharply contoured triphasic waves noted which have increased in frequency since last study and could be on the ictal-interictal continuum. These findings could also be secondary to toxic-metabolic etiology and seen with cefepime use. No seizures were seen throughout the recording. EEG appears worse than previous study. Can consider Long term monitoring to assess for subclinical seizures if clinically indicated.   Dr Leonie Man was notified.

## 2019-05-24 NOTE — Progress Notes (Signed)
Jeffrey [name]  Assessment/ Plan: Pt is a 63 y.o. yo male with ESRD on home hemodialysis, history of hepatitis B, HIV, seizure disorder AVR, admitted with V. fib arrest found to have STEMI.  Cardiac angiogram with 80% distal LAD status post balloon angioplasty.  # ESRD: Tolerating CRRT well. All 4K bath.  UF goal 50-100 cc an hour as tolerated.  Left femoral catheter placed on 8/28.   -Blood pressure is acceptable now off pressor.  We will discontinue CRRT today and assess daily for dialysis need.  # Anemia:, received red blood cell transfusion.  Iron sat 23 %. Order IV iron for 3 days and a dose of aranesp.   # Secondary hyperparathyroidism: Low phosphorus level.  Repleted phosphorus daily.  Stopping CRRT today.     # HTN/volume: Blood pressure acceptable, off pressor.    #Acute respiratory failure with hypoxia, vent dependent  #V. fib cardiac arrest with STEMI status post cardiac cath and stent placement to LAD.  #Acute metabolic encephalopathy: Monitor mental status. MRI with stroke and likely embolic phenomena.  Neurology is following.  Dialysis Orders:  HHD - 4d/week, 3.5hr, EDW ~79kg, AVF with buttonholes. Last dialysis 8/26. Outpatient Nephrologist is Dr. Holley Raring  Subjective: Seen and examined ICU.  Tolerating CRRT well.  Off pressor.  Remains unresponsive.  No new event.  Objective Vital signs in last 24 hours: Vitals:   05/24/19 0600 05/24/19 0700 05/24/19 0750 05/24/19 0800  BP: 117/69 113/80 127/82 99/75  Pulse:  (!) 102 (!) 104 99  Resp: 19 (!) 22 (!) 23 (!) 23  Temp:    97.7 F (36.5 C)  TempSrc:    Oral  SpO2: 94% 99% 100% 100%  Weight:      Height:       Weight change: -1.5 kg  Intake/Output Summary (Last 24 hours) at 05/24/2019 0843 Last data filed at 05/24/2019 0800 Gross per 24 hour  Intake 2677.98 ml  Output 3501 ml  Net -823.02 ml       Labs: Basic Metabolic Panel: Recent Labs  Lab 05/23/19 0434  05/23/19 1548 05/24/19 0400  NA 136 136 137  K 4.2 4.7 4.0  CL 101 100 102  CO2 '24 24 22  ' GLUCOSE 114* 125* 108*  BUN 22 24* 19  CREATININE 2.09* 1.88* 1.88*  CALCIUM 8.7* 8.4* 8.4*  PHOS 1.6* 3.1 1.6*   Liver Function Tests: Recent Labs  Lab 05/20/19 0350  05/23/19 0434 05/23/19 1548 05/24/19 0400  AST 86*  --   --   --   --   ALT 43  --   --   --   --   ALKPHOS 87  --   --   --   --   BILITOT 0.4  --   --   --   --   PROT 6.7  --   --   --   --   ALBUMIN 3.1*   < > 2.8* 2.8* 2.7*   < > = values in this interval not displayed.   No results for input(s): LIPASE, AMYLASE in the last 168 hours. No results for input(s): AMMONIA in the last 168 hours. CBC: Recent Labs  Lab 05/22/19 0415 05/22/19 0956 05/22/19 1730 05/23/19 0434 05/24/19 0400  WBC 15.1* 16.3* 13.8* 13.4* 13.2*  HGB 6.6* 7.8* 7.8* 7.7* 8.0*  HCT 22.0* 25.8* 25.9* 25.5* 26.3*  MCV 99.1 96.3 95.9 97.3 98.1  PLT 183 189 173 176 174   Cardiac Enzymes:  No results for input(s): CKTOTAL, CKMB, CKMBINDEX, TROPONINI in the last 168 hours. CBG: Recent Labs  Lab 05/23/19 1954 05/24/19 0009 05/24/19 0408 05/24/19 0730 05/24/19 0734  GLUCAP 98 101* 118* 40* 114*    Iron Studies:  Recent Labs    05/23/19 0434  IRON 36*  TIBC 154*  FERRITIN 495*   Studies/Results: Ct Head Wo Contrast  Result Date: 05/22/2019 CLINICAL DATA:  Encephalopathy EXAM: CT HEAD WITHOUT CONTRAST TECHNIQUE: Contiguous axial images were obtained from the base of the skull through the vertex without intravenous contrast. COMPARISON:  07/11/2018 FINDINGS: Brain: No evidence of acute infarction, hemorrhage, hydrocephalus, extra-axial collection or mass lesion/mass effect. Periventricular white matter hypodensity. Vascular: No hyperdense vessel or unexpected calcification. Skull: Normal. Negative for fracture or focal lesion. Sinuses/Orbits: No acute finding. Other: None. IMPRESSION: No acute intracranial pathology.  Small-vessel white  matter disease. Electronically Signed   By: Eddie Candle M.D.   On: 05/22/2019 15:31   Mr Brain Wo Contrast  Result Date: 05/23/2019 CLINICAL DATA:  Initial evaluation for acute altered mental status. EXAM: MRI HEAD WITHOUT CONTRAST TECHNIQUE: Multiplanar, multiecho pulse sequences of the brain and surrounding structures were obtained without intravenous contrast. COMPARISON:  Prior CT from 05/22/2019. FINDINGS: Brain: Examination severely degraded by motion artifact. Diffuse prominence of the CSF containing spaces compatible with generalized age-related cerebral atrophy. Patchy and confluent T2/FLAIR hyperintensity within the periventricular and deep white matter both cerebral hemispheres most consistent with chronic small vessel ischemic disease, moderate to advanced in nature. Superimposed remote lacunar infarct present at the left thalamus. Scattered multifocal foci of restricted diffusion seen involving the bilateral cerebral and cerebellar hemispheres, consistent with acute ischemic infarcts. The largest foci within the left cerebral hemisphere seen within the left occipital lobe in measures approximately 16 mm (series 5, image 61). Largest focus within the right cerebral hemisphere position within the right parietal cortex and measures 11 mm (series 5, image 80). Associated petechial hemorrhage about this infarct (series 15, image 18). No frank hemorrhagic transformation. Right greater than left cerebellar infarcts present, largest of which measures 16 mm on the right (series 5, image 51). Single punctate subcentimeter infarct noted involving the superior left thalamus (series 5, image 73). Deep gray nuclei and brainstem are otherwise spared. No other evidence for associated hemorrhage. No associated mass effect. Findings likely due to a central thromboembolic etiology. No mass lesion, midline shift, or visible mass effect. Ventricles normal size without hydrocephalus. No extra-axial fluid collection.  Pituitary gland suprasellar region within normal limits. Midline structures intact and normal. Vascular: Diminished flow void within the left V4 segment, which could be related to slow flow and/or occlusion (series 7, image 2). Major intracranial vascular flow voids otherwise maintained. Skull and upper cervical spine: Craniocervical junction within normal limits. Bone marrow signal intensity normal. No scalp soft tissue abnormality. Sinuses/Orbits: Globes and orbital soft tissues grossly within normal limits. Patient status post ocular lens replacement on the left. Paranasal sinuses are largely clear. Right greater than left mastoid effusions noted. Inner ear structures grossly normal. Other: None. IMPRESSION: 1. Technically limited exam due to extensive motion artifact. 2. Patchy multifocal acute ischemic infarcts involving the bilateral cerebral and cerebellar hemispheres as above. Given the various vascular distributions involved, a central thromboembolic etiology suspected. 3. Associated petechial hemorrhage about 1 of these infarcts at the right parietal cortex as above. No other evidence for associated hemorrhage. No frank hemorrhagic transformation or significant mass effect. 4. Underlying atrophy with moderate chronic microvascular ischemic disease. Electronically Signed  By: Jeannine Boga M.D.   On: 05/23/2019 02:10   Dg Chest Port 1 View  Result Date: 05/23/2019 CLINICAL DATA:  Acute respiratory failure.  End-stage renal disease. EXAM: PORTABLE CHEST 1 VIEW COMPARISON:  One-view chest x-ray 05/20/2019 FINDINGS: Endotracheal tube terminates 5 cm above the carina. NG tube courses off the inferior border the film. A left IJ line extends horizontally and may be in the azygos. The heart is enlarged. Pulmonary vascular congestion has increased. No significant airspace consolidation is present. IMPRESSION: 1. Cardiomegaly and increasing pulmonary vascular congestion. 2. Support apparatus is stable.  Electronically Signed   By: San Morelle M.D.   On: 05/23/2019 07:57    Medications: Infusions: .  prismasol BGK 4/2.5 500 mL/hr at 05/24/19 0122  .  prismasol BGK 4/2.5 500 mL/hr at 05/24/19 0124  . sodium chloride 10 mL/hr at 05/24/19 0800  . ceFEPime (MAXIPIME) IV Stopped (05/23/19 2142)  . feeding supplement (VITAL AF 1.2 CAL) 1,000 mL (05/24/19 0603)  . ferric gluconate (FERRLECIT/NULECIT) IV Stopped (05/23/19 1146)  . prismasol BGK 4/2.5 2,000 mL/hr at 05/24/19 0641  . sodium phosphate  Dextrose 5% IVPB 43 mL/hr at 05/24/19 0800    Scheduled Medications: . artificial tears  1 application Both Eyes J8S  . aspirin  81 mg Per Tube Daily  . B-complex with vitamin C  1 tablet Per Tube Daily  . bictegravir-emtricitabine-tenofovir AF  1 tablet Oral Daily  . chlorhexidine gluconate (MEDLINE KIT)  15 mL Mouth Rinse BID  . Chlorhexidine Gluconate Cloth  6 each Topical Daily  . feeding supplement (PRO-STAT SUGAR FREE 64)  30 mL Per Tube Daily  . heparin injection (subcutaneous)  5,000 Units Subcutaneous Q8H  . insulin aspart  1-3 Units Subcutaneous Q4H  . mouth rinse  15 mL Mouth Rinse 10 times per day  . metoprolol tartrate  12.5 mg Per Tube BID  . pantoprazole sodium  40 mg Per Tube QHS  . sodium chloride flush  3 mL Intravenous Q12H  . ticagrelor  90 mg Per Tube BID    have reviewed scheduled and prn medications.  Physical Exam: General: Intubated, not responding. Heart:RRR, s1s2 nl, no rubs Lungs: Coarse breath sound bilateral, no wheezing Abdomen:soft, Non-tender, non-distended Extremities:No edema Dialysis Access: Left femoral temporary dialysis catheter, left upper extremity AV fistula  Jeffrey [name] 05/24/2019,8:43 AM  LOS: 6 days  Pager: [phone]

## 2019-05-24 NOTE — Evaluation (Signed)
Physical Therapy Evaluation Patient Details Name: Jeffrey [name] MRN: [id number] DOB: 07-Oct-1955 Today's Date: 05/24/2019   History of Present Illness  Pt is a 63 y/o male with end-stage renal disease on home dialysis, aortic valve endocarditis (valve replacement 2019), HIV, hepatitis B, PVD, CAD, admitted 8/27 status post VF arrest estimated time to ROSC 35 minutes. 8/29 L heart cath. 8/29 EEG negative for seizure, reports sevre diffuse encephalopathy, on CRRT.  MRI 8/31 reveals patchy mild to focal ischemic infarcts with petechial hemorrhage, underlying atrophy with chronic microvascular ischemic disease.  Clinical Impression  Patient seen for bed level eval focus on cognitive evaluation with JFK coma scale completed.  Able to demonstrate some reflexive withdrawal on L UE, other extremities not showing reflexive responses, and LE's with some degree of extensor tone.  Patient with R gaze preference and head turned to R, but did spontaneously turn to L during session.  Also seemed to respond to oral care.  Feel he is appropriate for low level stimulation with acute PT/ABI team therapies.  Likely to need SNF level rehab at d/c.     Follow Up Recommendations SNF    Equipment Recommendations  None recommended by PT    Recommendations for Other Services       Precautions / Restrictions Precautions Precautions: Other (comment) Precaution Comments: L femoral HD line, ETT, CRRT Restrictions Weight Bearing Restrictions: No      Mobility  Bed Mobility               General bed mobility comments: NT- supine in bed: vent and CRRT  Transfers                 General transfer comment: NT   Ambulation/Gait                Stairs            Wheelchair Mobility    Modified Rankin (Stroke Patients Only) Modified Rankin (Stroke Patients Only) Pre-Morbid Rankin Score: Moderate disability Modified Rankin: Severe disability     Balance Overall balance assessment:  (NT)                                           Pertinent Vitals/Pain Pain Assessment: Faces Faces Pain Scale: No hurt    Home Living Family/patient expects to be discharged to:: Private residence Living Arrangements: Other relatives Available Help at Discharge: Family;Available PRN/intermittently Type of Home: House(townhome) Home Access: Stairs to enter Entrance Stairs-Rails: Right Entrance Stairs-Number of Steps: 3 Home Layout: Two level;Able to live on main level with bedroom/bathroom Home Equipment: Gilford Rile - 2 wheels;Walker - 4 wheels;Cane - quad Additional Comments: info above per chart review, pt unable to report information    Prior Function Level of Independence: Needs assistance         Comments: pt unable to report, per chart required assist     Hand Dominance        Extremity/Trunk Assessment   Upper Extremity Assessment Upper Extremity Assessment: Defer to OT evaluation RUE Deficits / Details: PROM WFL, no purposeful movements  RUE Sensation: (does not respond to noxious stimuli) RUE Coordination: decreased fine motor;decreased gross motor LUE Deficits / Details: patient purposefully resisting movement, flexor withdrawl  LUE Sensation: (withdrawls from noxious stimuli ) LUE Coordination: decreased fine motor;decreased gross motor    Lower Extremity Assessment Lower Extremity Assessment: LLE deficits/detail;RLE deficits/detail  RLE Deficits / Details: stiffness in ankle PF, limited due to temporary non-tunnled femoral dialysis catheter LLE Deficits / Details: PROM WFL. but some extensor tone easily broken evident    Cervical / Trunk Assessment Cervical / Trunk Assessment: Other exceptions Cervical / Trunk Exceptions: R cervical rotation majority of session, did rotate to L x 1, but would not let me rotate it  Communication   Communication: Other (comment)(vented)  Cognition Arousal/Alertness: Awake/alert Behavior During Therapy:  Flat affect Overall Cognitive Status: Impaired/Different from baseline Area of Impairment: JFK Recovery Scale Auditory: Auditory Startle Visual: Visual Startle Motor: Flexion Withdrawl Oromotor/Verbal: Vocalization/Oral Movement Communication: None Arousal: Eye opening without stimulation Total Score: 8                 General Comments: patient not following commands       General Comments General comments (skin integrity, edema, etc.): VSS    Exercises Other Exercises Other Exercises: PROM bilat LE's as able with L femoral dialysis catheter   Assessment/Plan    PT Assessment Patient needs continued PT services  PT Problem List Decreased mobility;Decreased cognition;Impaired tone;Decreased safety awareness;Cardiopulmonary status limiting activity       PT Treatment Interventions Therapeutic activities;Cognitive remediation;Manual techniques;Patient/family education;Therapeutic exercise;Functional mobility training    PT Goals (Current goals can be found in the Care Plan section)  Acute Rehab PT Goals Patient Stated Goal: unable to state PT Goal Formulation: Patient unable to participate in goal setting Time For Goal Achievement: 06/07/19 Potential to Achieve Goals: Fair    Frequency Min 2X/week   Barriers to discharge        Co-evaluation PT/OT/SLP Co-Evaluation/Treatment: Yes Reason for Co-Treatment: Complexity of the patient's impairments (multi-system involvement);Necessary to address cognition/behavior during functional activity PT goals addressed during session: Other (comment)(cognition) OT goals addressed during session: ADL's and self-care       AM-PAC PT "6 Clicks" Mobility  Outcome Measure Help needed turning from your back to your side while in a flat bed without using bedrails?: Total Help needed moving from lying on your back to sitting on the side of a flat bed without using bedrails?: Total Help needed moving to and from a bed to a chair  (including a wheelchair)?: Total Help needed standing up from a chair using your arms (e.g., wheelchair or bedside chair)?: Total Help needed to walk in hospital room?: Total Help needed climbing 3-5 steps with a railing? : Total 6 Click Score: 6    End of Session   Activity Tolerance: Other (comment)(limited due to cognition/CRRT) Patient left: in bed;with bed alarm set Nurse Communication: Mobility status PT Visit Diagnosis: Other symptoms and signs involving the nervous system (R29.898);Other abnormalities of gait and mobility (R26.89)    Time: 5621-3086 PT Time Calculation (min) (ACUTE ONLY): 25 min   Charges:   PT Evaluation $PT Eval High Complexity: 1 High          Magda Kiel, PT Acute Rehabilitation Services [phone] 05/24/2019   Reginia Naas 05/24/2019, 1:48 PM

## 2019-05-24 NOTE — Progress Notes (Signed)
LTM reviewed till 25. No seizures. Triphasics with sharply contoured morphology have improved, 1-1.5Hz  now.   Please refer to eeg report for more details.

## 2019-05-25 ENCOUNTER — Encounter (HOSPITAL_COMMUNITY): Payer: Self-pay

## 2019-05-25 DIAGNOSIS — G931 Anoxic brain damage, not elsewhere classified: Secondary | ICD-10-CM

## 2019-05-25 DIAGNOSIS — J9601 Acute respiratory failure with hypoxia: Secondary | ICD-10-CM

## 2019-05-25 LAB — CBC WITH DIFFERENTIAL/PLATELET
Abs Immature Granulocytes: 0.21 10*3/uL — ABNORMAL HIGH (ref 0.00–0.07)
Basophils Absolute: 0 10*3/uL (ref 0.0–0.1)
Basophils Relative: 0 %
Eosinophils Absolute: 0.2 10*3/uL (ref 0.0–0.5)
Eosinophils Relative: 1 %
HCT: 21.9 % — ABNORMAL LOW (ref 39.0–52.0)
Hemoglobin: 6.7 g/dL — CL (ref 13.0–17.0)
Immature Granulocytes: 1 %
Lymphocytes Relative: 8 %
Lymphs Abs: 1.2 10*3/uL (ref 0.7–4.0)
MCH: 30.5 pg (ref 26.0–34.0)
MCHC: 30.6 g/dL (ref 30.0–36.0)
MCV: 99.5 fL (ref 80.0–100.0)
Monocytes Absolute: 1.1 10*3/uL — ABNORMAL HIGH (ref 0.1–1.0)
Monocytes Relative: 8 %
Neutro Abs: 11.9 10*3/uL — ABNORMAL HIGH (ref 1.7–7.7)
Neutrophils Relative %: 82 %
Platelets: 181 10*3/uL (ref 150–400)
RBC: 2.2 MIL/uL — ABNORMAL LOW (ref 4.22–5.81)
RDW: 18.3 % — ABNORMAL HIGH (ref 11.5–15.5)
WBC: 14.7 10*3/uL — ABNORMAL HIGH (ref 4.0–10.5)
nRBC: 0.5 % — ABNORMAL HIGH (ref 0.0–0.2)

## 2019-05-25 LAB — GLUCOSE, CAPILLARY
Glucose-Capillary: 114 mg/dL — ABNORMAL HIGH (ref 70–99)
Glucose-Capillary: 120 mg/dL — ABNORMAL HIGH (ref 70–99)
Glucose-Capillary: 121 mg/dL — ABNORMAL HIGH (ref 70–99)
Glucose-Capillary: 125 mg/dL — ABNORMAL HIGH (ref 70–99)
Glucose-Capillary: 128 mg/dL — ABNORMAL HIGH (ref 70–99)
Glucose-Capillary: 133 mg/dL — ABNORMAL HIGH (ref 70–99)
Glucose-Capillary: 133 mg/dL — ABNORMAL HIGH (ref 70–99)
Glucose-Capillary: 137 mg/dL — ABNORMAL HIGH (ref 70–99)
Glucose-Capillary: 40 mg/dL — CL (ref 70–99)

## 2019-05-25 LAB — COMPREHENSIVE METABOLIC PANEL
ALT: 17 U/L (ref 0–44)
AST: 25 U/L (ref 15–41)
Albumin: 2.6 g/dL — ABNORMAL LOW (ref 3.5–5.0)
Alkaline Phosphatase: 71 U/L (ref 38–126)
Anion gap: 13 (ref 5–15)
BUN: 35 mg/dL — ABNORMAL HIGH (ref 8–23)
CO2: 23 mmol/L (ref 22–32)
Calcium: 8.4 mg/dL — ABNORMAL LOW (ref 8.9–10.3)
Chloride: 100 mmol/L (ref 98–111)
Creatinine, Ser: 3.18 mg/dL — ABNORMAL HIGH (ref 0.61–1.24)
GFR calc Af Amer: 23 mL/min — ABNORMAL LOW (ref >60)
GFR calc non Af Amer: 19 mL/min — ABNORMAL LOW (ref >60)
Glucose, Bld: 120 mg/dL — ABNORMAL HIGH (ref 70–99)
Potassium: 4.2 mmol/L (ref 3.5–5.1)
Sodium: 136 mmol/L (ref 135–145)
Total Bilirubin: 0.4 mg/dL (ref 0.3–1.2)
Total Protein: 6.6 g/dL (ref 6.5–8.1)

## 2019-05-25 LAB — PHOSPHORUS: Phosphorus: 2.6 mg/dL (ref 2.5–4.6)

## 2019-05-25 LAB — MAGNESIUM: Magnesium: 2.3 mg/dL (ref 1.7–2.4)

## 2019-05-25 LAB — PREPARE RBC (CROSSMATCH)

## 2019-05-25 LAB — TRIGLYCERIDES: Triglycerides: 117 mg/dL (ref <150)

## 2019-05-25 LAB — HEMOGLOBIN AND HEMATOCRIT, BLOOD
HCT: 25.5 % — ABNORMAL LOW (ref 39.0–52.0)
Hemoglobin: 7.8 g/dL — ABNORMAL LOW (ref 13.0–17.0)

## 2019-05-25 MED ORDER — SODIUM CHLORIDE 0.9% IV SOLUTION
Freq: Once | INTRAVENOUS | Status: AC
  Administered 2019-05-25: 12:00:00 via INTRAVENOUS

## 2019-05-25 MED ORDER — LORAZEPAM 2 MG/ML IJ SOLN
2.0000 mg | INTRAMUSCULAR | Status: AC
  Administered 2019-05-25: 14:00:00 2 mg via INTRAVENOUS
  Filled 2019-05-25: qty 1

## 2019-05-25 MED ORDER — LEVETIRACETAM IN NACL 500 MG/100ML IV SOLN
500.0000 mg | Freq: Two times a day (BID) | INTRAVENOUS | Status: DC
  Administered 2019-05-25: 03:00:00 500 mg via INTRAVENOUS
  Filled 2019-05-25: qty 100

## 2019-05-25 MED ORDER — PRO-STAT SUGAR FREE PO LIQD
60.0000 mL | Freq: Three times a day (TID) | ORAL | Status: DC
  Administered 2019-05-25 – 2019-05-31 (×19): 60 mL
  Filled 2019-05-25 (×19): qty 60

## 2019-05-25 MED ORDER — PROPOFOL 1000 MG/100ML IV EMUL
5.0000 ug/kg/min | INTRAVENOUS | Status: DC
  Administered 2019-05-25: 60 ug/kg/min via INTRAVENOUS
  Administered 2019-05-25: 20 ug/kg/min via INTRAVENOUS
  Administered 2019-05-25: 80 ug/kg/min via INTRAVENOUS
  Administered 2019-05-25 – 2019-05-26 (×3): 60 ug/kg/min via INTRAVENOUS
  Administered 2019-05-26: 13:00:00 20 ug/kg/min via INTRAVENOUS
  Administered 2019-05-26: 60 ug/kg/min via INTRAVENOUS
  Filled 2019-05-25: qty 100
  Filled 2019-05-25: qty 200
  Filled 2019-05-25 (×5): qty 100

## 2019-05-25 MED ORDER — SODIUM CHLORIDE 0.9 % IV SOLN
150.0000 mg | Freq: Two times a day (BID) | INTRAVENOUS | Status: DC
  Administered 2019-05-25 – 2019-06-02 (×17): 150 mg via INTRAVENOUS
  Filled 2019-05-25 (×19): qty 15

## 2019-05-25 MED ORDER — LORAZEPAM 2 MG/ML IJ SOLN
2.0000 mg | Freq: Once | INTRAMUSCULAR | Status: AC
  Administered 2019-05-25: 2 mg via INTRAVENOUS
  Filled 2019-05-25: qty 1

## 2019-05-25 MED ORDER — LEVETIRACETAM IN NACL 1000 MG/100ML IV SOLN
1000.0000 mg | INTRAVENOUS | Status: DC
  Administered 2019-05-26: 1000 mg via INTRAVENOUS
  Filled 2019-05-25: qty 100

## 2019-05-25 MED ORDER — LEVETIRACETAM IN NACL 500 MG/100ML IV SOLN: 500.0000 mg | INTRAVENOUS | Status: DC

## 2019-05-25 MED ORDER — VITAL AF 1.2 CAL PO LIQD
1000.0000 mL | ORAL | Status: DC
  Administered 2019-05-25 – 2019-05-30 (×5): 1000 mL

## 2019-05-25 NOTE — Progress Notes (Signed)
[facility] NEPHROLOGY PROGRESS NOTE  Assessment/ Plan: Pt is a 63 y.o. yo male with ESRD on home hemodialysis, history of hepatitis B, HIV, seizure disorder AVR, admitted with V. fib arrest found to have STEMI.  Cardiac angiogram with 80% distal LAD status post balloon angioplasty.  # ESRD: CRRT from 8/28-9/2.  Left femoral catheter placed on 8/28.   -Blood pressure was low and required Levophed.  His volume status looks acceptable.  FiO2 40 with PEEP 5.  Electrolytes acceptable.  Plan to hold off on dialysis today and try intermittent hemodialysis tomorrow if blood pressure allows.   # Anemia:, received red blood cell transfusion.  Iron sat 23 %. Order IV iron for 3 days and a dose of aranesp.   # Secondary hyperparathyroidism: Level acceptable today since she is off CRRT.    # HTN/volume: He was hypotensive required pressor, monitor blood pressure.    #Acute respiratory failure with hypoxia, vent dependent  #V. fib cardiac arrest with STEMI status post cardiac cath and stent placement to LAD.  #Acute metabolic encephalopathy, stroke: Monitor mental status. MRI with stroke and likely embolic phenomena.  Neurology is following.  Plan for CT scan noted.  Dialysis Orders:  HHD - 4d/week, 3.5hr, EDW ~79kg, AVF with buttonholes. Last dialysis 8/26. Outpatient Nephrologist is Dr. Holley Raring  Subjective: Seen and examined ICU.  Discontinued CRRT yesterday.  Blood pressure on lower side.  No new event.  Objective Vital signs in last 24 hours: Vitals:   05/25/19 0645 05/25/19 0700 05/25/19 0715 05/25/19 0750  BP: (!) 100/58 95/62 105/60   Pulse: 87 88 86   Resp: 20 15 (!) 23   Temp:    (!) 101 F (38.3 C)  TempSrc:    Oral  SpO2: 100% 100% 100%   Weight:      Height:       Weight change: -3.6 kg  Intake/Output Summary (Last 24 hours) at 05/25/2019 0753 Last data filed at 05/25/2019 0600 Gross per 24 hour  Intake 2419.38 ml  Output 1361 ml  Net 1058.38 ml        Labs: Basic Metabolic Panel: Recent Labs  Lab 05/23/19 1548 05/24/19 0400 05/25/19 0319  NA 136 137 136  K 4.7 4.0 4.2  CL 100 102 100  CO2 '24 22 23  ' GLUCOSE 125* 108* 120*  BUN 24* 19 35*  CREATININE 1.88* 1.88* 3.18*  CALCIUM 8.4* 8.4* 8.4*  PHOS 3.1 1.6* 2.6   Liver Function Tests: Recent Labs  Lab 05/20/19 0350  05/23/19 1548 05/24/19 0400 05/25/19 0319  AST 86*  --   --   --  25  ALT 43  --   --   --  17  ALKPHOS 87  --   --   --  71  BILITOT 0.4  --   --   --  0.4  PROT 6.7  --   --   --  6.6  ALBUMIN 3.1*   < > 2.8* 2.7* 2.6*   < > = values in this interval not displayed.   No results for input(s): LIPASE, AMYLASE in the last 168 hours. No results for input(s): AMMONIA in the last 168 hours. CBC: Recent Labs  Lab 05/22/19 0415 05/22/19 0956 05/22/19 1730 05/23/19 0434 05/24/19 0400  WBC 15.1* 16.3* 13.8* 13.4* 13.2*  HGB 6.6* 7.8* 7.8* 7.7* 8.0*  HCT 22.0* 25.8* 25.9* 25.5* 26.3*  MCV 99.1 96.3 95.9 97.3 98.1  PLT 183 189 173 176 174  Cardiac Enzymes: No results for input(s): CKTOTAL, CKMB, CKMBINDEX, TROPONINI in the last 168 hours. CBG: Recent Labs  Lab 05/24/19 1140 05/24/19 1617 05/24/19 1956 05/24/19 2352 05/25/19 0341  GLUCAP 137* 133* 111* 133* 114*    Iron Studies:  Recent Labs    05/23/19 0434  IRON 36*  TIBC 154*  FERRITIN 495*   Studies/Results: No results found.  Medications: Infusions: . sodium chloride Stopped (05/25/19 0556)  . cefTAZidime (FORTAZ)  IV Stopped (05/24/19 2219)  . feeding supplement (VITAL AF 1.2 CAL) 1,000 mL (05/25/19 0436)  . ferric gluconate (FERRLECIT/NULECIT) IV Stopped (05/24/19 1145)  . lacosamide (VIMPAT) IV Stopped (05/25/19 0558)  . levETIRAcetam Stopped (05/25/19 0334)  . norepinephrine (LEVOPHED) Adult infusion 8 mcg/min (05/25/19 0600)    Scheduled Medications: . artificial tears  1 application Both Eyes Y8F  . aspirin  81 mg Per Tube Daily  . B-complex with vitamin C   1 tablet Per Tube Daily  . chlorhexidine gluconate (MEDLINE KIT)  15 mL Mouth Rinse BID  . Chlorhexidine Gluconate Cloth  6 each Topical Daily  . emtricitabine-tenofovir AF  1 tablet Per Tube Daily   And  . dolutegravir  50 mg Oral Daily  . feeding supplement (PRO-STAT SUGAR FREE 64)  30 mL Per Tube Daily  . heparin injection (subcutaneous)  5,000 Units Subcutaneous Q8H  . insulin aspart  1-3 Units Subcutaneous Q4H  . mouth rinse  15 mL Mouth Rinse 10 times per day  . metoprolol tartrate  12.5 mg Per Tube BID  . pantoprazole sodium  40 mg Per Tube QHS  . sodium chloride flush  3 mL Intravenous Q12H  . ticagrelor  90 mg Per Tube BID    have reviewed scheduled and prn medications.  Physical Exam: Unchanged General: Intubated, not responding. Heart:RRR, s1s2 nl, no rubs Lungs: Coarse breath sound bilateral, no wheezing Abdomen:soft, Non-tender, non-distended Extremities: No lower extremity edema Dialysis Access: Left femoral temporary dialysis catheter, left upper extremity AV fistula  Jeffrey [name] Tanna Furry 05/25/2019,7:53 AM  LOS: 7 days  Pager: [phone]

## 2019-05-25 NOTE — Progress Notes (Addendum)
NAME:  Jeffrey [name], MRN:  [id number], DOB:  March 27, 1956, LOS: 7 ADMISSION DATE:  05/04/2019, CONSULTATION DATE:  8/27REFERRING MD:  cardama, CHIEF COMPLAINT:  Cardiac arrest   Brief History   63 year old male patient with end-stage renal disease on home dialysis admitted 8/27 status post VF arrest estimated time to ROSC 35 minutes.  Past Medical History  Prior aortic valve insufficiency, status post aortic valve endocarditis requiring aortic valve replacement 2019 Known diffuse nonobstructive coronary artery disease HIV Hepatitis B End-stage renal disease on home dialysis Hypothyroidism Peripheral vascular disease Prior GI bleed Coronary artery disease Prior left VATS for fibrothorax following bacteremia in February 2020 Baseline PFT Nov 2019: Fev1 0.95L/29%, DLCO 12.67/36%  Significant Hospital Events   8/27: Admitted status post VF arrest.  Estimated downtime 35 minutes.  Went for left heart cath, findings:Mid LAD lesion 100% stenosis, drug-eluting stent was successfully placed.  A distal/apical LAD lesion was stenosed at 80% and this was treated with PTCA.  He had nonobstructive disease involving the circumflex and RCA.  Hypothermia protocol initiated at: 1104 am temp goal reached  8/28 start re-warming at 11 am. Still pressor dependent. Left IJ site oozing required extra suture. EEg neg for seizure. Amiodarone stopped d/t bradycardia. Sending sputum and PCTs. Placing HD cath for CRRT. amio stopped overnight. Hypotensive   8/29  off levophed. On vent 40% fi02. Rewarmed at 3.30 am. Off sedation. Has cough and opened eyes but not following commands  8/30 Started on heparin gtt due to EKG changes of ischemia  8/31 Heparin drip stopped  9/2 CRRT stopped   Consults:  Cardiology  Neurology Nephrology  Procedures:  [place] 8/27>>> Left IJ CVL 8/27>>> Lt femoral HD cath 8/28>>>  Significant Diagnostic Tests:  Left heart cath 8/28: Mid LAD lesion 100% stenosis, drug-eluting stent  was successfully placed.  A distal/apical LAD lesion was stenosed at 80% and this was treated with PTCA.  He had nonobstructive disease involving the circumflex and RCA.  Echo 8/27: LVEF 45-50% with left ventricular wall dyskinesis. In afib at time of exam  EEG 8/27>>>no seizure  EEG 8/30-severe diffuse encephalopathy EEG 9/2: Frequent sharply contoured generalized discharges with triphasic morphology.  Irritability vs cefepime neurotoxicity.  CT head 8/31- small vessel white matter disease MRI 9/1- Patchy multifocal acute ischemic infarcts involving the bilateral cerebral and cerebellar hemispheres--suspicious for central thromboembolic etiology. Petechial hemorrhage about 1 of these infarcts at the right parietal cortex. Underlying atrophy with moderate chronic microvascular disease.  CXR 9/1-worsening pulm vasc congestion  Micro Data:  Corona virus 8/27: neg MRSA PCR 8/.27 - neg Respiratory culture 8/28>>> Serratia, Pseudomonas  Antimicrobials:  Cefepime 8/29 >9/2. Ceftazidime 9/2> Interim history/subjective:  Intubated. Off sedation without purposeful movements. Back on levophed for soft BPs   Objective   Blood pressure (!) 106/58, pulse 85, temperature (!) 101 F (38.3 C), temperature source Oral, resp. rate (!) 32, height 6' (1.829 m), weight 80.3 kg, SpO2 100 %.    Vent Mode: CPAP;PSV FiO2 (%):  [40 %] 40 % Set Rate:  [14 bmp] 14 bmp Vt Set:  [620 mL] 620 mL PEEP:  [5 cmH20] 5 cmH20 Pressure Support:  [10 cmH20] 10 cmH20 Plateau Pressure:  [18 cmH20-21 cmH20] 21 cmH20   Intake/Output Summary (Last 24 hours) at 05/25/2019 0810 Last data filed at 05/25/2019 0600 Gross per 24 hour  Intake 2289.47 ml  Output 1203 ml  Net 1086.47 ml   Filed Weights   05/23/19 0400 05/24/19 0500 05/25/19 0404  Weight:  85.4 kg 83.9 kg 80.3 kg   Gen:      No acute distress. Intubated.  HEENT:  EOMI, sclera anicteric, ET tube Neck:     No masses; no thyromegaly Lungs:    Clear to  auscultation bilaterally; normal respiratory effort. Breathing over vent intermittently CV:         Regular rate and rhythm; no murmurs Abd:      + bowel sounds; soft, non-tender; no palpable masses, mildly distended Ext:    No edema; adequate peripheral perfusion Skin:      Warm and dry; no rash Neuro: Unresponsive off sedation. PERRL. Right gaze deviation. No response to threat on either side. LUE flexion to sternal rub. No right sided movement to noxious stimuli.  Bilateral lower extremities withdraw to noxious stimuli.    Resolved Hospital Problem list     Assessment & Plan:  Known history of coronary artery disease with prior CABG and aortic valve replacement VF arrest 05/15/2019  in setting of severe CAD, now s/p DES to mid LAD and PTCA to distal/apical LAD lesion.  Amiodarone stopped d/t bradycardia (8/29), heparin started 8/30-8/31 Telemetry monitoring Cont uninterrupted dual platelet therapy for at least 12 months. On brilinta Per cardiology there is no role for repeat invasive cardiac procedure--continue medical management  Circulatory shock.  Bps stable. Maintaining MAPs in 80s New CM w/ EF 45-50% and mod dyskinesis Back on levophed, Lopressor started 05/23/19 Plan D/C lopressor Continue levophed blood pressure titration  Acute respiratory failure s/p cardiopulm arrest.  Vent settings: FiO2 40%, PEEP 5 Continue pressure support.  Mental status is a barrier to extubation.   Acute encephalopathy MRI reviewed with multiple embolic infarcts Sedation off since 8/29. LUE flexion to sternal rub. No purposeful movements. Neurology predicts poor neurological prognosis given length of time since event with minimal neurological change. Recommends continuing current management for another 2-3d.  EEG per neurology: Frequent sharply contoured generalized discharges with triphasic morphology which can be seen with cefepime neurotoxicity. Plan CT head without contrast today per neurology  Continue keppra + Vimpat--started 9/2 Will start propofol to evaluate for continued presence of triphasic waves Ceftazidime  End-stage renal disease. CRRT d/c on 9/2. Sharp decline in renal function since yesterday. Cr 1.8-->3.2. GFR 37-->19. Phos stable. Plan Continue to monitor for dialysis needs. Possible dialysis tomorrow pending BP stabilization Will need to trend renal labs closely now that he is off CRRT.   Anemia. Chronic disease and acute illness attributable. Hgb this am down to 6.7 from 8 yesterday. No apparent source of bleeding.  Status post 1 unit PRBC on 8/31 for hgb 6.6. Aranesp 9/1 Plan 1U PRBCs H/h 2h post transfusion Will need to follow h/h closely. If hemoglobins continue to drop, further evaluation with imaging and labs may be helpful in identifying source.  Known HIV and HepB. On biktarvy Pseudomonas, Serratia pneumonia 24H Tmax 101. White count trending up with presence of left shift. Cefepime d/c due to EEG results--received 5d course. See above.Started on ceftazidime on 9/2.  Plan Will switch to zosyn per pharmacy as ceftazidime can be another offending agent.  Hyperglycemia. One episode of hypoglycemia 9/2 am. A1C 4.7. not currently requiring insulin. Plan Monitor sugars  SSI  Best practice:  Diet: Tube feeds. Turned down: 65-->40 due to episode of emesis last PM. Will continue to titrate up as able Pain/Anxiety/Delirium protocol (if indicated): Off sedation VAP protocol (if indicated): Ordered DVT prophylaxis: [place] heparin  GI prophylaxis: Protonix Glucose control:Monitor Mobility: BR Code Status:  Full code Family Communication: PCCM physician discussed at bedside with patient's brother and sister who just arrived from [place].  Reviewed code status.  They want to continue full code for now while awaiting for neurology assessment. 9/2: Neurology discussed case with family who noted that he clearly expressed to them that he would not want to remain in  vegetative state long term.  Disposition: ICU  ATTESTATION & SIGNATURE   The patient is critically ill with multiple organ system failure and requires high complexity decision making for assessment and support, frequent evaluation and titration of therapies, advanced monitoring, review of radiographic studies and interpretation of complex data.   Critical Care Time devoted to patient care services, exclusive of separately billable procedures, described in this note is 35 minutes.   [facility] Internal Medicine Resident PGY1 05/25/2019, 8:10 AM

## 2019-05-25 NOTE — Progress Notes (Signed)
SLP Cancellation Note  Patient Details Name: Jeffrey [name] MRN: [id number] DOB: October 11, 1955   Cancelled treatment:       Reason Eval/Treat Not Completed: Medical issues which prohibited therapy(Pt remains intubated at this time. SLP will follow up)  [name] I. [name], [place], [name] Office number [phone] Pager [place] 05/25/2019, 8:09 AM

## 2019-05-25 NOTE — Progress Notes (Signed)
63 year old man with HIV, hep B, ESRD on home dialysis admitted 8/27 with VF arrest but prolonged downtime.  He underwent PTCA to distal LAD and hypothermia protocol. Cardiogenic shock resolved, CRRT was stopped 9/2 EEG 9/2 showed triphasic sharp waves with concern for seizures, given Ativan and Keppra and placed on LTM EEG  He remains comatose, critically ill, febrile to 101, intermittently has right-sided gaze, does not follow commands, localizes with left arm some extensor posturing on right to deep pain stimulus, decreased breath sounds bilateral, s1s2regular, no S3 no rub, soft nontender abdomen  Labs show drop in hemoglobin to 6.7, normal electrolytes, mild leukocytosis.  Chest x-ray 9/1 personally reviewed which shows pulmonary vascular congestion.  Impression/plan Acute encephalopathy-exam is out of proportion to finding of small?  Embolic strokes on MRI, hence have to treat aggressively for seizures-but also concerning for anoxic injury -Propofol drip today per neurology and LTM EEG, may need Ativan if he does not tolerate this hemodynamically.  Cardiogenic shock-resolved but now hypotensive again, titrate Levophed to effect Status post PTCA to LAD-continue DAPT  Pseudomonas/Serratia tracheobronchitis versus pneumonia-cefepime has been stopped due to concern for neurotoxicity, continue ceftazidime for 2 more days for total 7 days  Acute respiratory failure-he tolerates pressure support weaning but does not have mental status to extubate  ESRD-expect to come off pressors and can do intermittent dialysis tomorrow again. Drop in hemoglobin-no evidence of acute blood loss, will transfuse 1 unit PRBC  Detailed discussion with sister and brother 9/2, poor prognosis given for neurologic recovery but will need more accurate neuro prognostication depending on response to seizure control.  They were hesitant to make any decisions until clear prognosis provided  The patient is critically ill  with multiple organ systems failure and requires high complexity decision making for assessment and support, frequent evaluation and titration of therapies, application of advanced monitoring technologies and extensive interpretation of multiple databases. Critical Care Time devoted to patient care services described in this note independent of APP/resident  time is 35 minutes.   Leanna Sato Elsworth Soho MD

## 2019-05-25 NOTE — Progress Notes (Addendum)
Nutrition Follow-up  DOCUMENTATION CODES:   Not applicable  INTERVENTION:   -Continue b-complex with Vitamin C  Tube feeding:  -Vital AF 1.2 @ 30 ml/hr via NGT -60 ml Prostat TID  TF provides: 1464 kcals, 144 grams protein, 584 ml free water. Kcal exceed requirement to meet protein needs.   NUTRITION DIAGNOSIS:   Increased nutrient needs related to post-op healing as evidenced by estimated needs.  Ongoing  GOAL:   Patient will meet greater than or equal to 90% of their needs  Addressed via TF  MONITOR:   Diet advancement, Vent status, Labs, Weight trends, TF tolerance, Skin, I & O's  REASON FOR ASSESSMENT:   Ventilator    ASSESSMENT:   Patient with PMH significant for ESRD on home dialysis, AVS, Hepatitis B, HIV, HTN, and seizures. Presents this admission with VF arrest in setting of severe CAD.   8/27- s/p L heart cath 8/28- R IJ cath placed, start CRRT  9/2- CRRT stopped   Pt discussed during ICU rounds and with RN.   Brain MRI reveals multiple embolic infarcts- poor neurological prognosis per neurology. Started on Propofol. HD likely tomorrow. Tolerating Vital AF @ 40 ml/hr. RD to make changes to account for propofol.   EDW per nephrology: 79 kg  Admission weight: 87 kg  Current weight 80.3 kg   Patient remains intubated on ventilator support MV: 11.2 L/min Temp (24hrs), Avg:99.8 F (37.7 C), Min:98.3 F (36.8 C), Max:101.1 F (38.4 C)   Propofol: 38.5 ml/hr- provides 1016 kcal from lipids daily  I/O: +1,589 ml since admit CRRT yesterday: 1,361 ml x 24 hrs   Drips: levophed, propofol Medications: b complex with Vitamin C, SS novolog   Labs: CBG 101-137 Cr 3.18- trending up   Diet Order:   Diet Order            Diet NPO time specified  Diet effective now              EDUCATION NEEDS:   Not appropriate for education at this time  Skin:  Skin Assessment: Skin Integrity Issues: Skin Integrity Issues:: DTI DTI: R head  Last BM:   9/1  Height:   Ht Readings from Last 1 Encounters:  05/20/2019 6' (1.829 m)    Weight:   Wt Readings from Last 1 Encounters:  05/25/19 80.3 kg    Ideal Body Weight:  80.9 kg  BMI:  Body mass index is 24.01 kg/m.  Estimated Nutritional Needs:   Kcal:  1992 kcal  Protein:  130-150 grams  Fluid:  1000 ml + UOP   Mariana Single RD, LDN Clinical Nutrition Pager # [phone]

## 2019-05-25 NOTE — Procedures (Addendum)
Patient Name:Jeffrey [name] ZOX:[id number] Epilepsy Attending:Jhoana Upham [name] Referring Physician/Provider:Dr Zeb Comfort Duration: 05/24/2019 1903 to 05/25/2019 1703  Patient history:63yo M with cardiac arrest, now on TTM. EEG to evaluate for seizures.  Level of alertness:comatose  AEDs during EEG study:LEV, LCM  Technical aspects: This EEG study was done with scalp electrodes positioned according to the 10-20 International system of electrode placement. Electrical activity was acquired at a sampling rate of 500Hz  and reviewed with a high frequency filter of 70Hz  and a low frequency filter of 1Hz . EEG data were recorded continuously and digitally stored.  DESCRIPTION: EEG initially showed triphasic waves which were sharply contoured with overriding fast activity, 1.5 to 2 Hz frequency, maximal bifrontal.  Gradually the frequency improved to 1 to 1.5 Hz and the waves appeared less sharply contoured.  Around 11:30 AM on 06/21/2019, patient was started on propofol after which he EEG had significant improvement.  EEG then showed burst suppression pattern with generalized suppression lasting 4 to 5 seconds followed by bursts of generalized 4 to 5 Hz theta slowing.  Hyperventilation and photic stimulation were not performed.  IMPRESSION:  This study shows sharply contoured triphasic waves which improved after administration of antiseizure medications and propofol and are on the ictal/interictal continuum.  No clear seizures were seen.  Additionally there was evidence of severe degree of encephalopathy.  EEG appears to be improved compared to yesterday.

## 2019-05-25 NOTE — Progress Notes (Signed)
Subjective: Changed from cefepime to ceftaz  Exam: Vitals:   05/25/19 0815 05/25/19 0830  BP: (!) 115/59 (!) 98/56  Pulse: 86 86  Resp: (!) 28 (!) 27  Temp:    SpO2: 100% 100%   Gen: In bed, NAD Resp: non-labored breathing, no acute distress Abd: soft, nt  Neuro: MS: Doe snto open eyes or follow commands  GP:QDIYM, corneals intact, eyes initally not deviated, dysconjugate, with stimulation, he does deviate downwards.  Motor: Extensor on the right, quasi-localizing on the left(does not cross midline but does seem to try to brush hand away on left side. Flexion vs withdrawal bilateral lower extremities.  Sensory:as above.   Pertinent Labs: Cr 3.18  Impression: 63 yo M with anoxic brain injury as well as multifocal strokes in the setting of cardiac arrest.  I suspect multifocal emboli due to his cardiac arrest.  His exam is currently worse than I would expect based on his imaging which can happen with anoxic injury, however given the periodic pattern that were seen on EEG I do wonder about confounding factors.  Possible etiology of these discharges would include Gpeds due to anoxic injury, cefepime toxicity, or less likely there could be an ictal nature to them.  I would favor suppressing these discharges for a time(e.g 24 hours) and then slowly lightening sedation.  If they return, I am not certain that I would aggressively pursue further treatment of them.  Recommendations: 1) Currently on vimpat 150mg  BID(home medication) 2) Continue keppra at max dose for renal function(1g daily with 500mg  after dialysis) 3) propofol with titration to discharge suppression  Roland Rack, MD [facility] [phone]  If 7pm- 7am, please page neurology on call as listed in [place].

## 2019-05-25 NOTE — Progress Notes (Signed)
LTM EEG checked, impedances within limits.  No skin breakdown noted.

## 2019-05-26 ENCOUNTER — Encounter (HOSPITAL_COMMUNITY): Payer: Self-pay

## 2019-05-26 DIAGNOSIS — B20 Human immunodeficiency virus [HIV] disease: Secondary | ICD-10-CM

## 2019-05-26 LAB — BPAM RBC
Blood Product Expiration Date: 202009112359
Blood Product Expiration Date: 202009192359
ISSUE DATE / TIME: 202008310704
ISSUE DATE / TIME: 202009031248
Unit Type and Rh: 9500
Unit Type and Rh: 9500

## 2019-05-26 LAB — TYPE AND SCREEN
ABO/RH(D): O NEG
Antibody Screen: NEGATIVE
Unit division: 0
Unit division: 0

## 2019-05-26 LAB — RENAL FUNCTION PANEL
Albumin: 2.3 g/dL — ABNORMAL LOW (ref 3.5–5.0)
Anion gap: 14 (ref 5–15)
BUN: 64 mg/dL — ABNORMAL HIGH (ref 8–23)
CO2: 21 mmol/L — ABNORMAL LOW (ref 22–32)
Calcium: 8.7 mg/dL — ABNORMAL LOW (ref 8.9–10.3)
Chloride: 101 mmol/L (ref 98–111)
Creatinine, Ser: 5.63 mg/dL — ABNORMAL HIGH (ref 0.61–1.24)
GFR calc Af Amer: 11 mL/min — ABNORMAL LOW (ref >60)
GFR calc non Af Amer: 10 mL/min — ABNORMAL LOW (ref >60)
Glucose, Bld: 109 mg/dL — ABNORMAL HIGH (ref 70–99)
Phosphorus: 3.9 mg/dL (ref 2.5–4.6)
Potassium: 4 mmol/L (ref 3.5–5.1)
Sodium: 136 mmol/L (ref 135–145)

## 2019-05-26 LAB — CBC
HCT: 24.4 % — ABNORMAL LOW (ref 39.0–52.0)
Hemoglobin: 7.5 g/dL — ABNORMAL LOW (ref 13.0–17.0)
MCH: 30.5 pg (ref 26.0–34.0)
MCHC: 30.7 g/dL (ref 30.0–36.0)
MCV: 99.2 fL (ref 80.0–100.0)
Platelets: 205 10*3/uL (ref 150–400)
RBC: 2.46 MIL/uL — ABNORMAL LOW (ref 4.22–5.81)
RDW: 18.7 % — ABNORMAL HIGH (ref 11.5–15.5)
WBC: 13.7 10*3/uL — ABNORMAL HIGH (ref 4.0–10.5)
nRBC: 0.3 % — ABNORMAL HIGH (ref 0.0–0.2)

## 2019-05-26 LAB — GLUCOSE, CAPILLARY
Glucose-Capillary: 100 mg/dL — ABNORMAL HIGH (ref 70–99)
Glucose-Capillary: 104 mg/dL — ABNORMAL HIGH (ref 70–99)
Glucose-Capillary: 104 mg/dL — ABNORMAL HIGH (ref 70–99)
Glucose-Capillary: 110 mg/dL — ABNORMAL HIGH (ref 70–99)
Glucose-Capillary: 114 mg/dL — ABNORMAL HIGH (ref 70–99)
Glucose-Capillary: 92 mg/dL (ref 70–99)

## 2019-05-26 LAB — MAGNESIUM: Magnesium: 2.2 mg/dL (ref 1.7–2.4)

## 2019-05-26 MED ORDER — LEVETIRACETAM IN NACL 1000 MG/100ML IV SOLN
1000.0000 mg | INTRAVENOUS | Status: DC
  Administered 2019-05-27 – 2019-06-02 (×7): 1000 mg via INTRAVENOUS
  Filled 2019-05-26 (×7): qty 100

## 2019-05-26 MED ORDER — HEPARIN SODIUM (PORCINE) 1000 UNIT/ML DIALYSIS: 20.0000 [IU]/kg | INTRAMUSCULAR | Status: DC | PRN

## 2019-05-26 MED ORDER — CHLORHEXIDINE GLUCONATE CLOTH 2 % EX PADS: 6.0000 | MEDICATED_PAD | Freq: Every day | CUTANEOUS | Status: DC

## 2019-05-26 MED ORDER — HEPARIN SODIUM (PORCINE) 1000 UNIT/ML IJ SOLN
3.2000 mL | Freq: Once | INTRAMUSCULAR | Status: AC
  Administered 2019-05-26: 17:00:00 3200 [IU] via INTRAVENOUS
  Filled 2019-05-26: qty 4

## 2019-05-26 MED ORDER — VALPROATE SODIUM 500 MG/5ML IV SOLN
3000.0000 mg | Freq: Once | INTRAVENOUS | Status: AC
  Administered 2019-05-26: 3000 mg via INTRAVENOUS
  Filled 2019-05-26: qty 30

## 2019-05-26 MED ORDER — PENTAFLUOROPROP-TETRAFLUOROETH EX AERO: 1.0000 "application " | INHALATION_SPRAY | CUTANEOUS | Status: DC | PRN

## 2019-05-26 MED ORDER — DARBEPOETIN ALFA 60 MCG/0.3ML IJ SOSY: 60.0000 ug | PREFILLED_SYRINGE | INTRAMUSCULAR | Status: DC

## 2019-05-26 MED ORDER — LEVETIRACETAM IN NACL 500 MG/100ML IV SOLN
500.0000 mg | INTRAVENOUS | Status: DC
  Administered 2019-05-29 – 2019-05-31 (×2): 500 mg via INTRAVENOUS
  Filled 2019-05-26 (×2): qty 100

## 2019-05-26 MED ORDER — LIDOCAINE-PRILOCAINE 2.5-2.5 % EX CREA
1.0000 "application " | TOPICAL_CREAM | CUTANEOUS | Status: DC | PRN
  Filled 2019-05-26: qty 5

## 2019-05-26 MED ORDER — SODIUM CHLORIDE 0.9 % IV SOLN: 100.0000 mL | INTRAVENOUS | Status: DC | PRN

## 2019-05-26 MED ORDER — LIDOCAINE HCL (PF) 1 % IJ SOLN: 5.0000 mL | INTRAMUSCULAR | Status: DC | PRN

## 2019-05-26 MED ORDER — ATORVASTATIN CALCIUM 40 MG PO TABS
40.0000 mg | ORAL_TABLET | Freq: Every day | ORAL | Status: DC
  Administered 2019-05-26: 40 mg via ORAL
  Filled 2019-05-26 (×2): qty 1

## 2019-05-26 MED ORDER — ALTEPLASE 2 MG IJ SOLR: 2.0000 mg | Freq: Once | INTRAMUSCULAR | Status: DC | PRN

## 2019-05-26 MED ORDER — HEPARIN SODIUM (PORCINE) 1000 UNIT/ML DIALYSIS: 1000.0000 [IU] | INTRAMUSCULAR | Status: DC | PRN

## 2019-05-26 NOTE — Procedures (Addendum)
Patient Name:Jeffrey [name] RPR:[id number] Epilepsy Attending:Ronnita Paz [name] Referring Physician/Provider:Dr Zeb Comfort Duration:9/4/20201704 to 05/27/2019 4462   Patient history:63yo M with cardiac arrest, now on TTM. EEG to evaluate for seizures.  Level of alertness:comatose  AEDs during EEG study:LEV, LCM  Technical aspects: This EEG study was done with scalp electrodes positioned according to the 10-20 International system of electrode placement. Electrical activity was acquired at a sampling rate of 500Hz  and reviewed with a high frequency filter of 70Hz  and a low frequency filter of 1Hz . EEG data were recorded continuously and digitally stored.  DESCRIPTION:EEG showed frequent generalized perioidc epielptiform discharges with triphasic morphology which at times were more frequent at the rate of 1.5-3 Hz and with overriding fast activity. Hyperventilation and photic stimulation were not performed.  IMPRESSION:  This studycontinues to show generalized periodic epileptiform discharges with triphasic morphology which at times appear more rhythmic without any evolution and are on the ictal-interictal continuum. Additionally, there is evidence of severe degree of encephalopathy.  This EEG appears worse than yesterday in term of the frequency of GPEDs.

## 2019-05-26 NOTE — Progress Notes (Signed)
EEG Maintance is complete/ frontal leads and A2/ regelled Cz/ no skin breakdown, continue to monitor

## 2019-05-26 NOTE — Procedures (Signed)
Patient Name:Jeffrey [name] WYO:[id number] Epilepsy Attending:Emersyn Wyss [name] Referring Physician/Provider:Dr Zeb Comfort Duration:05/25/2019 1704 to 05/26/2019 1704  Patient history:63yo M with cardiac arrest, now on TTM. EEG to evaluate for seizures.  Level of alertness:comatose  AEDs during EEG study:LEV, LCM, propofol  Technical aspects: This EEG study was done with scalp electrodes positioned according to the 10-20 International system of electrode placement. Electrical activity was acquired at a sampling rate of 500Hz  and reviewed with a high frequency filter of 70Hz  and a low frequency filter of 1Hz . EEG data were recorded continuously and digitally stored.  DESCRIPTION:  EEG initially showed burst suppression pattern with generalized suppression lasting 2-4 seconds followed by bursts of generalized 4 to 5 Hz theta slowing lasting 2-4 seconds. After propofol was discontinued there were frequent generalized perioidc epielptiform discharges with triphasic morphology which at times were more frequent at the rate of 1-2Hz  and with overriding fast activity.  Hyperventilation and photic stimulation were not performed.  IMPRESSION:  This study continues to show generalized periodic epileptiform discharges with triphasic morphology which initially appeared to have improved after addition of anti seizure medications and /or discontinuation of cefepime. At times they appear more rhythmic without any evolution and are on the ictal-interictal continuum. Additionally, there is evidence of severe degree of encephalopathy.

## 2019-05-26 NOTE — Plan of Care (Signed)
  Problem: Clinical Measurements: Goal: Will remain free from infection Outcome: Progressing Goal: Cardiovascular complication will be avoided Outcome: Progressing   Problem: Nutrition: Goal: Adequate nutrition will be maintained Outcome: Progressing   Problem: Coping: Goal: Level of anxiety will decrease Outcome: Progressing   Problem: Elimination: Goal: Will not experience complications related to bowel motility Outcome: Progressing   Problem: Pain Managment: Goal: General experience of comfort will improve Outcome: Progressing   Problem: Activity: Goal: Ability to tolerate increased activity will improve Outcome: Progressing   Problem: Respiratory: Goal: Ability to maintain a clear airway and adequate ventilation will improve Outcome: Progressing   Problem: Elimination: Goal: Will not experience complications related to urinary retention Outcome: Not Applicable   Problem: Education: Goal: Knowledge of General Education information will improve Description: Including pain rating scale, medication(s)/side effects and non-pharmacologic comfort measures Outcome: Not Progressing   Problem: Health Behavior/Discharge Planning: Goal: Ability to manage health-related needs will improve Outcome: Not Progressing   Problem: Clinical Measurements: Goal: Ability to maintain clinical measurements within normal limits will improve Outcome: Not Progressing Goal: Diagnostic test results will improve Outcome: Not Progressing Goal: Respiratory complications will improve Outcome: Not Progressing   Problem: Activity: Goal: Risk for activity intolerance will decrease Outcome: Not Progressing   Problem: Role Relationship: Goal: Method of communication will improve Outcome: Not Progressing

## 2019-05-26 NOTE — Progress Notes (Signed)
SLP Cancellation Note  Patient Details Name: JEFFRY VOGELSANG MRN: [id number] DOB: [birth year]   Cancelled treatment:       Reason Eval/Treat Not Completed: Patient not medically ready, on the vent. Will continue to follow for speech/language evaluation.   Venita Sheffield Priya Matsen 05/26/2019, 8:43 AM  Pollyann Glen, M.A. [place] [facility] education officer [phone] Office [phone]

## 2019-05-26 NOTE — Progress Notes (Signed)
Subjective: Sedated with propofol overnight.   Exam: Vitals:   05/26/19 0900 05/26/19 1000  BP: (!) 103/58 (!) 102/57  Pulse: 61 63  Resp: 12 14  Temp:    SpO2: 100% 100%   Gen: In bed, NAD Resp: non-labored breathing, no acute distress Abd: soft, nt  Neuro: MS: Does not open eyes or follow commands  PN:TIRWE, corneals intact, eyes initally not deviated, dysconjugate, with stimulation, he does deviate downwards.  Motor: Extensor on the right, quasi-localizing on the left(does not cross midline but does seem to try to brush hand away on left side. Flexion vs withdrawal bilateral lower extremities.  Sensory:as above.   Pertinent Labs: Cr 3.18  Impression: 63 yo M with anoxic brain injury as well as multifocal strokes in the setting of cardiac arrest.  I suspect multifocal emboli due to his cardiac arrest.  His exam is currently worse than I would expect based on his imaging which can happen with anoxic injury, however given the periodic pattern that were seen on EEG I do wonder about confounding factors.  Possible etiology of these discharges would include GPEDs due to anoxic injury, cefepime toxicity, or less likely there could be an ictal nature to them.  I favored suppressing these discharges for a time. He did have some bradycardia associated with propofol, and when decreased, his discharges did return.   At this point, I think it is likely he had some anoxic injury in addition to his strokes, but given the possibility of cefepime toxicity, I would favor changing from a cephalosporin and giving at least a few days/ dialysis sessions to clear it prior to making further decisions regarding aggressiveness of care.    Recommendations: 1) Currently on vimpat 150mg  BID(home medication) 2) Continue keppra at max dose for renal function(1g daily with 500mg  after dialysis) 3) wean propofol today.  4) continue EEG  This patient is critically ill and at significant risk of neurological  worsening, death and care requires constant monitoring of vital signs, hemodynamics,respiratory and cardiac monitoring, neurological assessment, discussion with family, other specialists and medical decision making of high complexity. I spent 35 minutes of neurocritical care time  in the care of  this patient. This was time spent independent of any time provided by nurse practitioner or PA.  Roland Rack, MD [facility] [phone]  If 7pm- 7am, please page neurology on call as listed in [place]. 05/26/2019  10:41 AM

## 2019-05-26 NOTE — Progress Notes (Addendum)
NAME:  EDUIN FRIEDEL, MRN:  [id number], DOB:  1955-11-12, LOS: 8 ADMISSION DATE:  04/23/2019, CONSULTATION DATE:  8/27REFERRING MD:  cardama, CHIEF COMPLAINT:  Cardiac arrest   Brief History   63 year old male patient with end-stage renal disease on home dialysis admitted 8/27 status post VF arrest estimated time to ROSC 35 minutes.  Past Medical History  Prior aortic valve insufficiency, status post aortic valve endocarditis requiring aortic valve replacement 2019 Known diffuse nonobstructive coronary artery disease HIV Hepatitis B End-stage renal disease on home dialysis Hypothyroidism Peripheral vascular disease Prior GI bleed Coronary artery disease Prior left VATS for fibrothorax following bacteremia in February 2020 Baseline PFT Nov 2019: Fev1 0.95L/29%, DLCO 12.67/36%  Significant Hospital Events   8/27: Admitted status post VF arrest.  Estimated downtime 35 minutes.  Went for left heart cath, findings:Mid LAD lesion 100% stenosis, drug-eluting stent was successfully placed.  A distal/apical LAD lesion was stenosed at 80% and this was treated with PTCA.  He had nonobstructive disease involving the circumflex and RCA.  Hypothermia protocol initiated at: 1104 am temp goal reached  8/28 start re-warming at 11 am. Still pressor dependent. Left IJ site oozing required extra suture. EEg neg for seizure. Amiodarone stopped d/t bradycardia. Sending sputum and PCTs. Placing HD cath for CRRT. amio stopped overnight. Hypotensive   8/29  off levophed. On vent 40% fi02. Rewarmed at 3.30 am. Off sedation. Has cough and opened eyes but not following commands  8/30 Started on heparin gtt due to EKG changes of ischemia  8/31 Heparin drip stopped  9/2 CRRT stopped   Consults:  Cardiology  Neurology Nephrology  Procedures:  [place] 8/27>>> Left IJ CVL 8/27>>> Lt femoral HD cath 8/28>>>  Significant Diagnostic Tests:  Left heart cath 8/28: Mid LAD lesion 100% stenosis, drug-eluting stent  was successfully placed.  A distal/apical LAD lesion was stenosed at 80% and this was treated with PTCA.  He had nonobstructive disease involving the circumflex and RCA.  Echo 8/27: LVEF 45-50% with left ventricular wall dyskinesis. In afib at time of exam  EEG 8/27>>>no seizure  EEG 8/30-severe diffuse encephalopathy EEG 9/2: Frequent sharply contoured generalized discharges with triphasic morphology.  Irritability vs cefepime neurotoxicity. EEG 9/3: Improved triphasic waves following initiation of antiseizure meds EEG 9/4: return of triphasic waves with propofol wean  CT head 8/31- small vessel white matter disease MRI 9/1- Patchy multifocal acute ischemic infarcts involving the bilateral cerebral and cerebellar hemispheres--suspicious for central thromboembolic etiology. Petechial hemorrhage about 1 of these infarcts at the right parietal cortex. Underlying atrophy with moderate chronic microvascular disease.  CXR 9/1-worsening pulm vasc congestion  Micro Data:  Corona virus 8/27: neg MRSA PCR 8/.27 - neg Respiratory culture 8/28>>> Serratia, Pseudomonas  Antimicrobials:  Cefepime 8/29 >9/2. Ceftazidime 9/2> Interim history/subjective:  Intubated. Off sedation without purposeful movements. Back on levophed for soft BPs   Objective   Blood pressure (!) 100/57, pulse (!) 59, temperature 98.6 F (37 C), temperature source Oral, resp. rate 14, height 6' (1.829 m), weight 86.3 kg, SpO2 100 %.    Vent Mode: PRVC FiO2 (%):  [40 %] 40 % Set Rate:  [14 bmp] 14 bmp Vt Set:  [620 mL] 620 mL PEEP:  [5 cmH20] 5 cmH20 Plateau Pressure:  [17 cmH20-21 cmH20] 21 cmH20   Intake/Output Summary (Last 24 hours) at 05/26/2019 0819 Last data filed at 05/26/2019 0800 Gross per 24 hour  Intake 2967.13 ml  Output 0 ml  Net 2967.13 ml   Filed  Weights   05/24/19 0500 05/25/19 0404 05/26/19 0116  Weight: 83.9 kg 80.3 kg 86.3 kg   Gen:      No acute distress. Intubated.  HEENT:  EOMI, sclera  anicteric, ET tube. Edematous tongue. Large volume of oral secretions. Neck:     No masses; no thyromegaly Lungs:    Bibasilar crackles CV:         Regular rate and rhythm; no murmurs Abd:      + bowel sounds; soft, non-tender; no palpable masses, mildly distended Ext:    No edema; adequate peripheral perfusion Skin:      Warm and dry; no rash Neuro: Unresponsive off sedation. Does not follow commands. PERRL. Fixed gaze. No response to threat on either side. Withdrawal of bilateral upper extremities to noxious stimuli, more prominent on left. No withdrawal of lower extremities.   Resolved Hospital Problem list     Assessment & Plan:  Known history of coronary artery disease with prior CABG and aortic valve replacement  VF arrest 05/11/2019  in setting of severe CAD, now s/p DES to mid LAD and PTCA to distal/apical LAD lesion.  Amiodarone stopped d/t bradycardia (8/29), heparin started 8/30-8/31 Telemetry monitoring Cont uninterrupted dual platelet therapy for at least 12 months. On brilinta Per cardiology there is no role for repeat invasive cardiac procedure--continue medical management  Circulatory shock- presumed distributive due to propofol  Bps stable on levophed. Maintaining MAPs in 70s New CM w/ EF 45-50% and mod dyskinesis Plan Dialysis today may require increase levophed  Acute respiratory failure s/p cardiopulm arrest.  Vent settings: FiO2 40%, PEEP 5.  Continue pressure support.  Mental status is a barrier to extubation.   Acute encephalopathy. On vimpat at home for history of seizures MRI reviewed with multiple embolic infarcts. Propofol on since 9/3. Improvement of Triphasic waves on EEG after administration of antiseizure medications + propofol. Return of triphasic waves with propofol wean Neurology recommended changing antibiotics over concern for cefepime neurotoxicity. Ceftazidime started 9/2 however this is 2nd most common offending agent. Further neurological  prognosis pending completion of cephalosporin which should be tomorrow Plan Continue keppra (started 9/2) and home vimpat. Will need to monitor for thrombocytopenia as it was noted that he developed this after starting keppra in 2019 Propofol Following antibiotic completion tomorrow, avoid future use of cephalosporins   End-stage renal disease. CRRT d/c on 9/2. Significant decline in renal function since 9/2. Cr 1.8-->3.2-->5.6. GFR 37-->19-->10.  Plan Dialysis today per nephrology.  If blood pressures not tolerated hemodialysis, may need to resume CRRT   Anemia. Chronic disease and acute illness attributable.  Status post 1 unit PRBC on 8/31. S/p 1U PRBC 9/3 Aranesp 9/1 Plan Continue to monitor daily hemoglobins.   Known HIV and HepB. On biktarvy Pseudomonas, Serratia pneumonia Afebrile overnight. White count stable Cefepime d/c due to EEG results--received 5d course. See above.Started on ceftazidime on 9/2.  Plan Ceftazidime. Last dose tomorrow  Hyperglycemia. A1C 4.7.  Plan Monitor sugars  SSI  Best practice:  Diet: Tube feeds. Pain/Anxiety/Delirium protocol (if indicated): propofol VAP protocol (if indicated): Ordered DVT prophylaxis: subcut heparin GI prophylaxis: Protonix Glucose control:Monitor Mobility: BR Code Status: Full code Family Communication: PCCM physician discussed at bedside with patient's brother and sister who just arrived from [place].  Reviewed code status.  They want to continue full code for now while awaiting for neurology assessment. 9/2: Neurology discussed case with family who noted that he clearly expressed to them that he would not want  to remain in vegetative state long term.  Disposition: ICU   [facility] Resident PGY1 05/26/2019, 8:19 AM        Mr. Caylor is a 63 y/o gentleman with a PMH of seizures, HIV, and ESRD who presented with an OOH VF arrest due to an LAD STEMI. He underwent PCI with DES with anoxic  brain injury. He has required increased AEPs in addition to his PTA vimpat to control epileptiform discharges. Overnight no significant changes. Per nurses he will withdraw to pain but not respond to voice.  BP 112/61   Pulse 72   Temp 98.9 F (37.2 C) (Oral)   Resp (!) 21   Ht 6' (1.829 m)   Wt 86.3 kg   SpO2 100%   BMI 25.80 kg/m  Elderly man in NAD, appears stated age Intubated, sedated on propofol Large tongue protruding from his mouth, copious oral secretions Breathing comfortably on the vent, breathing above the set rate Withdraws diffusely to trapezius pinch, but no withdraw to nailbed pressure in any extremity. + pupils & corneals, + cough. Exam performed on propofol No edema, no wounds  A&P: Acute vent dependent respiratory failure due to encephalopathy. Not appropriate to extubate due to mental status. -daily SAT & SBT as appropriate- no SAT due to propofol to suppress epileptiform discharges- will defer timing of stopping this to Neuro -titrate down O2 as able to maintain SpO2 >90% -VAP precautions  Post-arrest due to STEMI, s/p DES to LAD. Previous cardiogenic shock resolved. -con't DAPT -starting high intensity statin -con't heparin per Cardiology  Distributive shock due to propofol -levophed as required to maintain MAP >65  ESRD -HD per nephro  Epilepsy- PTA on vimpat. Now with encephalopathy- metabolic vs anoxic. -last day of ceftazadime today- can't switch to zosyn due to anaphylaxis history unfortunately -will con't to assess off antibiotics -con't keppra and vimpat -wean propofol per neuro\ -PPI for GI prophylaxis  VAP due to Serratia and pseudomonas -day 7 of antibiotics today, then stop  HIV -con't current regimen (adjusted from home regimen)  Acute on chronic anemia from ESRD & likely ICU-related blood loss -tranfuse for Hb <7 unless actively bleeding -minimize blood draws -EPO per nephro  This patient is critically ill with multiple organ  system failure which requires frequent high complexity decision making, assessment, support, evaluation, and titration of therapies. This was completed through the application of advanced monitoring technologies and extensive interpretation of multiple databases. During this encounter critical care time was devoted to patient care services described in this note for 40 minutes.  Julian Hy, DO 05/26/19 12:15 PM [facility]

## 2019-05-26 NOTE — Progress Notes (Addendum)
Spoke to brother [name] regarding HD tx today and gavie [name] permission to dialyze pt.and informed this wirter that he would prefer if we'll use the HD cath access instead of AVF. Nephroligst made aware and ok to use the HD cath for HD tx today.

## 2019-05-26 NOTE — Progress Notes (Signed)
[facility] NEPHROLOGY PROGRESS NOTE  Assessment/ Plan: Pt is a 63 y.o. yo male with ESRD on home hemodialysis, history of hepatitis B, HIV, seizure disorder AVR, admitted with V. fib arrest found to have STEMI.  Cardiac angiogram with 80% distal LAD status post balloon angioplasty.  # ESRD: CRRT from 8/28-9/2.  Left femoral catheter placed on 8/28.   -Blood pressure was low and required Levophed.  His volume status looks acceptable.  FiO2 40 with PEEP 5.  Electrolytes acceptable.   -Plan for HD today, goal UF 1 kg, blood pressure soft therefore may need to titrate up the dose of levo.  If he cannot tolerate intermittent hemodialysis then we need to resume CRRT.  # Anemia:, received red blood cell transfusion.  Iron sat 23 %. Ordered IV iron for 3 days and aranesp on 9/1.  Transfuse as needed  # Secondary hyperparathyroidism: Calcium, phosphorus level acceptable.  # HTN/volume: On low-dose Levophed.  Volume status looks acceptable.  #Acute respiratory failure with hypoxia, vent dependent  #V. fib cardiac arrest with STEMI status post cardiac cath and stent placement to LAD.  #Acute metabolic encephalopathy, stroke: Monitor mental status. MRI with stroke and likely embolic phenomena.  Neurology is following.  Seems poor prognosis.  Dialysis Orders:  HHD - 4d/week, 3.5hr, EDW ~79kg, AVF with buttonholes. Last dialysis 8/26. Outpatient Nephrologist is Dr. Holley Raring  Subjective: Seen and examined ICU.  Blood pressure soft on low-dose Levophed.  No new event.  Objective Vital signs in last 24 hours: Vitals:   05/26/19 0600 05/26/19 0738 05/26/19 0739 05/26/19 0745  BP: 98/61  (!) 87/53 (!) 100/57  Pulse: (!) 59  (!) 57 (!) 59  Resp: _0 Temp:  98.6 F (37 C)    TempSrc:  Oral    SpO2: 99%   100%  Weight:      Height:       Weight change: 6 kg  Intake/Output Summary (Last 24 hours) at 05/26/2019 0827 Last data filed at 05/26/2019 0800 Gross per 24 hour  Intake  2967.13 ml  Output 0 ml  Net 2967.13 ml       Labs: Basic Metabolic Panel: Recent Labs  Lab 05/24/19 0400 05/25/19 0319 05/26/19 0440  NA 137 136 136  K 4.0 4.2 4.0  CL 102 100 101  CO2 22 23 21*  GLUCOSE 108* 120* 109*  BUN 19 35* 64*  CREATININE 1.88* 3.18* 5.63*  CALCIUM 8.4* 8.4* 8.7*  PHOS 1.6* 2.6 3.9   Liver Function Tests: Recent Labs  Lab 05/20/19 0350  05/24/19 0400 05/25/19 0319 05/26/19 0440  AST 86*  --   --  25  --   ALT 43  --   --  17  --   ALKPHOS 87  --   --  71  --   BILITOT 0.4  --   --  0.4  --   PROT 6.7  --   --  6.6  --   ALBUMIN 3.1*   < > 2.7* 2.6* 2.3*   < > = values in this interval not displayed.   No results for input(s): LIPASE, AMYLASE in the last 168 hours. No results for input(s): AMMONIA in the last 168 hours. CBC: Recent Labs  Lab 05/22/19 1730 05/23/19 0434 05/24/19 0400 05/25/19 0910 05/25/19 1728 05/26/19 0440  WBC 13.8* 13.4* 13.2* 14.7*  --  13.7*  NEUTROABS  --   --   --  11.9*  --   --  HGB 7.8* 7.7* 8.0* 6.7* 7.8* 7.5*  HCT 25.9* 25.5* 26.3* 21.9* 25.5* 24.4*  MCV 95.9 97.3 98.1 99.5  --  99.2  PLT 173 176 174 181  --  205   Cardiac Enzymes: No results for input(s): CKTOTAL, CKMB, CKMBINDEX, TROPONINI in the last 168 hours. CBG: Recent Labs  Lab 05/25/19 1613 05/25/19 1946 05/25/19 2344 05/26/19 0343 05/26/19 0740  GLUCAP 121* 120* 114* 100* 92    Iron Studies:  No results for input(s): IRON, TIBC, TRANSFERRIN, FERRITIN in the last 72 hours. Studies/Results: No results found.  Medications: Infusions: . sodium chloride 10 mL/hr at 05/26/19 0743  . cefTAZidime (FORTAZ)  IV Stopped (05/25/19 2200)  . lacosamide (VIMPAT) IV 150 mg (05/26/19 0809)  . levETIRAcetam    . levETIRAcetam    . norepinephrine (LEVOPHED) Adult infusion 5 mcg/min (05/26/19 0807)  . propofol (DIPRIVAN) infusion 60 mcg/kg/min (05/26/19 0753)    Scheduled Medications: . artificial tears  1 application Both Eyes A5W  .  aspirin  81 mg Per Tube Daily  . B-complex with vitamin C  1 tablet Per Tube Daily  . chlorhexidine gluconate (MEDLINE KIT)  15 mL Mouth Rinse BID  . Chlorhexidine Gluconate Cloth  6 each Topical Daily  . Chlorhexidine Gluconate Cloth  6 each Topical Q0600  . emtricitabine-tenofovir AF  1 tablet Per Tube Daily   And  . dolutegravir  50 mg Oral Daily  . feeding supplement (PRO-STAT SUGAR FREE 64)  60 mL Per Tube TID  . feeding supplement (VITAL AF 1.2 CAL)  1,000 mL Per Tube Q24H  . heparin injection (subcutaneous)  5,000 Units Subcutaneous Q8H  . insulin aspart  1-3 Units Subcutaneous Q4H  . mouth rinse  15 mL Mouth Rinse 10 times per day  . pantoprazole sodium  40 mg Per Tube QHS  . sodium chloride flush  3 mL Intravenous Q12H  . ticagrelor  90 mg Per Tube BID    have reviewed scheduled and prn medications.  Physical Exam: Unchanged General: Intubated, quite sedated. Heart:RRR, s1s2 nl, no rubs Lungs: Coarse breath sound bilateral, no wheezing Abdomen:soft, Non-tender, non-distended Extremities: No lower extremity edema Dialysis Access: Left femoral temporary dialysis catheter, left upper extremity AV fistula   Tanna Furry 05/26/2019,8:27 AM  LOS: 8 days  Pager: [phone]

## 2019-05-27 ENCOUNTER — Inpatient Hospital Stay (HOSPITAL_COMMUNITY): Payer: Medicare Other

## 2019-05-27 DIAGNOSIS — R569 Unspecified convulsions: Secondary | ICD-10-CM

## 2019-05-27 LAB — RENAL FUNCTION PANEL
Albumin: 2.4 g/dL — ABNORMAL LOW (ref 3.5–5.0)
Anion gap: 16 — ABNORMAL HIGH (ref 5–15)
BUN: 66 mg/dL — ABNORMAL HIGH (ref 8–23)
CO2: 20 mmol/L — ABNORMAL LOW (ref 22–32)
Calcium: 8.3 mg/dL — ABNORMAL LOW (ref 8.9–10.3)
Chloride: 101 mmol/L (ref 98–111)
Creatinine, Ser: 5.6 mg/dL — ABNORMAL HIGH (ref 0.61–1.24)
GFR calc Af Amer: 11 mL/min — ABNORMAL LOW (ref >60)
GFR calc non Af Amer: 10 mL/min — ABNORMAL LOW (ref >60)
Glucose, Bld: 95 mg/dL (ref 70–99)
Phosphorus: 4 mg/dL (ref 2.5–4.6)
Potassium: 4.3 mmol/L (ref 3.5–5.1)
Sodium: 137 mmol/L (ref 135–145)

## 2019-05-27 LAB — CBC
HCT: 24 % — ABNORMAL LOW (ref 39.0–52.0)
Hemoglobin: 7.3 g/dL — ABNORMAL LOW (ref 13.0–17.0)
MCH: 30.3 pg (ref 26.0–34.0)
MCHC: 30.4 g/dL (ref 30.0–36.0)
MCV: 99.6 fL (ref 80.0–100.0)
Platelets: 183 10*3/uL (ref 150–400)
RBC: 2.41 MIL/uL — ABNORMAL LOW (ref 4.22–5.81)
RDW: 19.2 % — ABNORMAL HIGH (ref 11.5–15.5)
WBC: 11 10*3/uL — ABNORMAL HIGH (ref 4.0–10.5)
nRBC: 0 % (ref 0.0–0.2)

## 2019-05-27 LAB — HEPATITIS B SURFACE ANTIGEN

## 2019-05-27 LAB — MAGNESIUM: Magnesium: 2.2 mg/dL (ref 1.7–2.4)

## 2019-05-27 LAB — GLUCOSE, CAPILLARY
Glucose-Capillary: 102 mg/dL — ABNORMAL HIGH (ref 70–99)
Glucose-Capillary: 88 mg/dL (ref 70–99)
Glucose-Capillary: 88 mg/dL (ref 70–99)
Glucose-Capillary: 90 mg/dL (ref 70–99)
Glucose-Capillary: 90 mg/dL (ref 70–99)
Glucose-Capillary: 92 mg/dL (ref 70–99)
Glucose-Capillary: 97 mg/dL (ref 70–99)

## 2019-05-27 LAB — HEPATITIS B SURFACE AG, CONFIRM: HBsAG Confirmation: POSITIVE — AB

## 2019-05-27 MED ORDER — FENTANYL CITRATE (PF) 100 MCG/2ML IJ SOLN
25.0000 ug | INTRAMUSCULAR | Status: DC | PRN
  Administered 2019-05-29 – 2019-06-01 (×4): 50 ug via INTRAVENOUS
  Filled 2019-05-27 (×5): qty 2

## 2019-05-27 MED ORDER — METOPROLOL TARTRATE 12.5 MG HALF TABLET
12.5000 mg | ORAL_TABLET | Freq: Two times a day (BID) | ORAL | Status: DC
  Administered 2019-05-27: 12.5 mg via ORAL
  Filled 2019-05-27: qty 1

## 2019-05-27 MED ORDER — ATORVASTATIN CALCIUM 40 MG PO TABS
40.0000 mg | ORAL_TABLET | Freq: Every day | ORAL | Status: DC
  Administered 2019-05-27 – 2019-06-01 (×6): 40 mg
  Filled 2019-05-27 (×5): qty 1

## 2019-05-27 MED ORDER — DARBEPOETIN ALFA 60 MCG/0.3ML IJ SOSY
60.0000 ug | PREFILLED_SYRINGE | INTRAMUSCULAR | Status: DC
  Administered 2019-05-29: 10:00:00 60 ug via INTRAVENOUS
  Filled 2019-05-27: qty 0.3

## 2019-05-27 MED ORDER — METOPROLOL TARTRATE 12.5 MG HALF TABLET
12.5000 mg | ORAL_TABLET | Freq: Two times a day (BID) | ORAL | Status: DC
  Administered 2019-05-27 – 2019-05-28 (×3): 12.5 mg
  Filled 2019-05-27 (×3): qty 1

## 2019-05-27 NOTE — Plan of Care (Signed)
Mr. Jerger brother and sister were updated at bedside. All questions were answered. They have previously spoken to Dr. Leonel Ramsay from Neurology today.  Julian Hy, DO 05/27/19 2:09 PM [facility]

## 2019-05-27 NOTE — Progress Notes (Signed)
Pt transported from 2H16 to CT and back with no complications.

## 2019-05-27 NOTE — Progress Notes (Signed)
Subjective: Discahrges continue on EEG  Exam: Vitals:   05/27/19 0700 05/27/19 0802  BP: 121/71 113/67  Pulse: 86 89  Resp: (!) 27 20  Temp: 99.6 F (37.6 C)   SpO2: 100%    Gen: In bed, NAD Resp: non-labored breathing, no acute distress Abd: soft, nt  Neuro: MS: Does not open eyes or follow commands. He does grimace to noxious stimlulation.  TT:SVXBL, corneals intact, eyes dysconjugate, no deviation, both move with oculocephalic, cough intact.  Motor: Flexion to noxious simulation in the left arm, flexion in bilateral legs.  Sensory:as above.   Pertinent Labs: Cr 5.6  Impression: 63 yo M with anoxic brain injury as well as multifocal strokes in the setting of cardiac arrest.  I suspect multifocal emboli due to his cardiac arrest.  His exam is currently worse than I would expect based on his imaging which can happen with anoxic injury, however given the periodic pattern that were seen on EEG I do wonder about confounding factors.  Possible etiology of these discharges would include GPEDs due to anoxic injury, cefepime toxicity, or less likely there could be an ictal nature to them.  I favored suppressing these discharges for a time, however with lightening sedation they promptly returned unchanged.    At this point, I think it is likely he had some anoxic injury in addition to his strokes, but given the possibility of cephalosporin toxicity, I would favor changing from a cephalosporin and giving at least a few days/ dialysis sessions to clear it prior to making further decisions regarding aggressiveness of care.    Recommendations: 1) Currently on vimpat 150mg  BID(home medication) 2) Continue keppra at max dose for renal function(1g daily with 500mg  after dialysis) 3) will d/c EEG today.   This patient is critically ill and at significant risk of neurological worsening, death and care requires constant monitoring of vital signs, hemodynamics,respiratory and cardiac monitoring,  neurological assessment, discussion with family, other specialists and medical decision making of high complexity. I spent 35 minutes of neurocritical care time  in the care of  this patient. This was time spent independent of any time provided by nurse practitioner or PA.  Roland Rack, MD [facility] [phone]  If 7pm- 7am, please page neurology on call as listed in [place]. 05/27/2019  8:42 AM

## 2019-05-27 NOTE — Progress Notes (Signed)
NAME:  Jeffrey [name], MRN:  [id number], DOB:  [birth year], LOS: 9 ADMISSION DATE:  05/12/2019, CONSULTATION DATE:  8/27  REFERRING MD: 8/27, CHIEF COMPLAINT: Cardiac arrest  Brief History   63 year old M admitted to [facility] on 8/27 status post V. fib arrest with an estimated downtime of 35 minutes before ROSC.  Completed TTM protocol.  Neuro prognostication pending.  Past Medical History  HIV Diffuse nonobstructive CAD Hepatitis B ESRD on home HD Hypothyroidism Peripheral vascular disease Prior GI bleed Aortic valve insufficiency s/p aortic valve endocarditis requiring replacement in 2019 Left VATS for fibrothorax following bacteremia in 10/2018  Significant Hospital Events   8/27  Admit with VF arrest, ~35 minute downtime 8/28  Re-warming, pressor dependent, EEG neg for seizure, amio stopped d/t brady, HD  8/29  Off Levophed, rewarmed 0330, off sedation  8/30  Heparin gtt initiated with EKG changes concerning for ischemia  8/31  Heparin gtt stopped  9/02  CVVHD stopped  9/05  Not waking, abnormal neurological exam   Consults:  Cardiology Neurology Nephrology  Procedures:  ETT 8/27 >>  L IJ CVL 8/27 >>  L Femoral HD 8/28 >>   Significant Diagnostic Tests:  ECHO 8/27 >> LVEF 45 to 50% with left ventricular wall dyskinesis, and AF at time of exam EEG 8/27 >> negative for seizure LHC 8/28 >> mid LAD lesion 100% stenosis, drug-eluting stent placed, distal apical LAD lesion stenosis 80% treated with PTCA, nonobstructive disease involving the circumflex and RCA EEG 8/30 >> frequent sharply contoured generalized discharges with triphasic morphology.  Irritability versus cefepime neurotoxicity CT Head 8/31 >> small vessel white matter disease MRI 9/1 >> patchy multifocal acute ischemic infarcts involving the bilateral cerebral and cerebellar hemispheres-suspicious for central thromboembolic etiology.  Petechial hemorrhage about 1 of these infarcts of the right parietal cortex.   Underlying atrophy with chronic microvascular disease EEG 9/3 >> improved triphasic waves following initiation of antiseizure medications EEG 9/4 >> return of triphasic waves with propofol wean  Micro Data:  COVID 8/27 >> negative  Tracheal aspirate 8/28 >> moderate pseudomonas aeruginosa >> S-ceftaz           Serratia marcescens >> R- cefazolin, S-ceftaz Antimicrobials:  Cefepime 8/29 >> 9/2 Ceftazidime 9/2 >> 9/5  Interim history/subjective:  No acute events reported overnight.  T-max 99.6. HD volume removed 1L, ~275 ml + in 24/h. Off vasopressors since 9/4 1700.   Objective   Blood pressure 113/67, pulse 89, temperature 99.6 F (37.6 C), temperature source Oral, resp. rate 20, height 6' (1.829 m), weight 86.6 kg, SpO2 100 %. CVP:  [2 mmHg-28 mmHg] 9 mmHg  Vent Mode: PRVC FiO2 (%):  [25 %] 25 % Set Rate:  [14 bmp] 14 bmp Vt Set:  [620 mL] 620 mL PEEP:  [5 cmH20] 5 cmH20 Plateau Pressure:  [16 cmH20-20 cmH20] 16 cmH20   Intake/Output Summary (Last 24 hours) at 05/27/2019 1112 Last data filed at 05/27/2019 0900 Gross per 24 hour  Intake 1391.67 ml  Output 1400 ml  Net -8.33 ml   Filed Weights   05/25/19 0404 05/26/19 0116 05/27/19 0417  Weight: 80.3 kg 86.3 kg 86.6 kg    Examination: General: Critically ill-appearing adult male lying in bed on mechanical ventilation in no acute distress HEENT: MM pink/moist, ETT, pupils equal and sluggishly reactive Neuro: eyes open with forward gaze, does not blink to threat, does not follow provider, no gag with deep suction, no response to pain x4 CV: Sinus rhythm on monitor, holosystolic  murmur PULM: Even/nonlabored on mechanical ventilation, lungs bilaterally with coarse rhonchi GI: soft, bsx4 active  Extremities: warm/dry, no edema  Skin: no rashes or lesions  Resolved Hospital Problem list   Shock - initial component cardiogenic +/- distributive with PNA  Assessment & Plan:   VF Arrest  -approx 35 minute downtime before ROSC  -post TTM  P: Supportive care in ICU  Await neurological prognostication, see below   Acute Encephalopathy  -concern for hypoxic / metabolic  Suspected Anoxic Brain Injury + Multifocal Strokes  -in setting of cardiac arrest P: [facility] input Neuro rec's > await clearance of cefepime for neuro prognostication.  Likely mid week (~9/9) before can reassess Continue Keppra, Vimpat, Valproate Follow serial neuro exams  Minimize sedating medications as able to allow for neuro exam   Severe CAD  -s/p DES to Mid LAD and PTCA to distal LAD lesion 8/27  PAF  -did not tolerated amiodarone d/t bradycardia   P: Monitor in ICU  Cardiology rec's > continue ASA, ticagrelor per tube  Depending neuro recovery, will need Cardiology follow up as outpatient   Acute Hypoxic Respiratory Failure  -in setting of cardiac arrest, pseudomonal / serratia PNA -cefepime stopped due to EEG results Pseudomonal / Serratia PNA  P: PRVC 8cc/kg  Wean PEEP / fiO2 for sats >90% SBT as tolerated but mental status remains barrier for extubation  Follow intermittent CXR  D8 abx, discontinue after 9/5 dosing and monitor off abx   ESRD  -on HD at home  P: [facility] assistance with care Continue Aranesp  Trend BMP / urinary output Replace electrolytes as indicated  Anemia  -chronic disease + superimposed acute illness  -1 unit PRBC 8/31 P: Trend CBC  Transfuse per guidelines > Hgb <8 given CAD / admit with arrest / LAD occlusion   HIV  -on Biktarvy  Hepatitis B P: Continue Biktarvy (or formulary substitute)  Hyperglycemia  P: SSI   At Risk Malnutrition  P: Continue TF   Best practice:  Diet: TF Pain/Anxiety/Delirium protocol (if indicated): Minimize sedating medications for neuro exam VAP protocol (if indicated): In place  DVT prophylaxis: SQ Heparin  GI prophylaxis: PPI  Glucose control: SSI  Mobility: BR  Code Status: Full Code  Family Communication: Will update  family on arrival 9/5 Disposition: ICU   Labs   CBC: Recent Labs  Lab 05/23/19 0434 05/24/19 0400 05/25/19 0910 05/25/19 1728 05/26/19 0440 05/27/19 0416  WBC 13.4* 13.2* 14.7*  --  13.7* 11.0*  NEUTROABS  --   --  11.9*  --   --   --   HGB 7.7* 8.0* 6.7* 7.8* 7.5* 7.3*  HCT 25.5* 26.3* 21.9* 25.5* 24.4* 24.0*  MCV 97.3 98.1 99.5  --  99.2 99.6  PLT 176 174 181  --  205 665    Basic Metabolic Panel: Recent Labs  Lab 05/23/19 0434 05/23/19 1548 05/24/19 0400 05/25/19 0319 05/26/19 0440 05/27/19 0416  NA 136 136 137 136 136 137  K 4.2 4.7 4.0 4.2 4.0 4.3  CL 101 100 102 100 101 101  CO2 24 24 22 23  21* 20*  GLUCOSE 114* 125* 108* 120* 109* 95  BUN 22 24* 19 35* 64* 66*  CREATININE 2.09* 1.88* 1.88* 3.18* 5.63* 5.60*  CALCIUM 8.7* 8.4* 8.4* 8.4* 8.7* 8.3*  MG 2.5*  --  2.4 2.3 2.2 2.2  PHOS 1.6* 3.1 1.6* 2.6 3.9 4.0   GFR: Estimated Creatinine Clearance: 14.4 mL/min (A) (by C-G formula based  on SCr of 5.6 mg/dL (H)). Recent Labs  Lab 05/22/19 1347 05/22/19 1722  05/24/19 0400 05/25/19 0910 05/26/19 0440 05/27/19 0416  WBC  --   --    < > 13.2* 14.7* 13.7* 11.0*  LATICACIDVEN 1.2 0.9  --   --   --   --   --    < > = values in this interval not displayed.    Liver Function Tests: Recent Labs  Lab 05/23/19 1548 05/24/19 0400 05/25/19 0319 05/26/19 0440 05/27/19 0416  AST  --   --  25  --   --   ALT  --   --  17  --   --   ALKPHOS  --   --  71  --   --   BILITOT  --   --  0.4  --   --   PROT  --   --  6.6  --   --   ALBUMIN 2.8* 2.7* 2.6* 2.3* 2.4*   No results for input(s): LIPASE, AMYLASE in the last 168 hours. No results for input(s): AMMONIA in the last 168 hours.  ABG    Component Value Date/Time   PHART 7.478 (H) 05/21/2019 0507   PCO2ART 37.0 05/21/2019 0507   PO2ART 108.0 05/21/2019 0507   HCO3 27.5 05/21/2019 0507   TCO2 29 05/21/2019 0507   ACIDBASEDEF 3.0 (H) 05/20/2019 0456   O2SAT 99.0 05/21/2019 0507     Coagulation Profile:  No results for input(s): INR, PROTIME in the last 168 hours.  Cardiac Enzymes: No results for input(s): CKTOTAL, CKMB, CKMBINDEX, TROPONINI in the last 168 hours.  HbA1C: Hgb A1c MFr Bld  Date/Time Value Ref Range Status  05/24/2019 04:00 AM 4.7 (L) 4.8 - 5.6 % Final    Comment:    (NOTE) Pre diabetes:          5.7%-6.4% Diabetes:              >6.4% Glycemic control for   <7.0% adults with diabetes   07/26/2018 02:59 PM 4.2 (L) 4.8 - 5.6 % Final    Comment:    (NOTE) Pre diabetes:          5.7%-6.4% Diabetes:              >6.4% Glycemic control for   <7.0% adults with diabetes     CBG: Recent Labs  Lab 05/26/19 1544 05/26/19 2105 05/26/19 2349 05/27/19 0413 05/27/19 0754  GLUCAP 110* 104* 102* 88 92     Critical care time: 35 minutes      Noe Gens, NP-C [facility] Pgr: [phone] or if no answer [phone] 05/27/2019, 11:54 AM

## 2019-05-27 NOTE — Progress Notes (Signed)
LTM EEG discontinued -  skin breakdown at unhook fp2. Nurse notified

## 2019-05-27 NOTE — Progress Notes (Signed)
[facility] NEPHROLOGY PROGRESS NOTE  Assessment/ Plan: Pt is a 63 y.o. yo male with ESRD on home hemodialysis, history of hepatitis B, HIV, seizure disorder AVR, admitted with V. fib arrest found to have STEMI.  Cardiac angiogram with 80% distal LAD status post balloon angioplasty.  # ESRD: CRRT from 8/28-9/2.  Left femoral catheter placed on 8/28.   -Intermittent hemodialysis yesterday, tolerated well with UF 1 L.  Volume status acceptable.  Plan for next dialysis on Monday.  Respiratory parameters improving. -Blood pressure was low and required Levophed.  His volume status looks acceptable.    # Anemia:, received red blood cell transfusion.  Iron sat 23 %. Ordered IV iron for 3 days and aranesp on 9/1.  Transfuse as needed  # Secondary hyperparathyroidism: Calcium, phosphorus level acceptable.  # HTN/volume: Blood pressure acceptable, off Levophed today. Volume status looks acceptable.  #Acute respiratory failure with hypoxia, vent dependent, FiO2 25%.  #V. fib cardiac arrest with STEMI status post cardiac cath and stent placement to LAD.  #Acute metabolic encephalopathy, stroke: Monitor mental status. MRI with stroke and likely embolic phenomena.  Neurology is following.  Seems poor prognosis.  Dialysis Orders:  HHD - 4d/week, 3.5hr, EDW ~79kg, AVF with buttonholes. Last dialysis 8/26. Outpatient Nephrologist is Dr. Holley Raring  Subjective: Seen and examined ICU.  Tolerated dialysis well.  Remains on ventilator.  Off Levophed.   No new event.  Objective Vital signs in last 24 hours: Vitals:   05/27/19 0630 05/27/19 0700 05/27/19 0800 05/27/19 0802  BP: 114/67 121/71 113/67 113/67  Pulse: 85 86 88 89  Resp: 20 (!) '27 20 20  ' Temp:  99.6 F (37.6 C)    TempSrc:  Oral    SpO2: 100% 100% 100%   Weight:      Height:       Weight change: 0.3 kg  Intake/Output Summary (Last 24 hours) at 05/27/2019 0915 Last data filed at 05/27/2019 0900 Gross per 24 hour  Intake  1552.78 ml  Output 1400 ml  Net 152.78 ml       Labs: Basic Metabolic Panel: Recent Labs  Lab 05/25/19 0319 05/26/19 0440 05/27/19 0416  NA 136 136 137  K 4.2 4.0 4.3  CL 100 101 101  CO2 23 21* 20*  GLUCOSE 120* 109* 95  BUN 35* 64* 66*  CREATININE 3.18* 5.63* 5.60*  CALCIUM 8.4* 8.7* 8.3*  PHOS 2.6 3.9 4.0   Liver Function Tests: Recent Labs  Lab 05/25/19 0319 05/26/19 0440 05/27/19 0416  AST 25  --   --   ALT 17  --   --   ALKPHOS 71  --   --   BILITOT 0.4  --   --   PROT 6.6  --   --   ALBUMIN 2.6* 2.3* 2.4*   No results for input(s): LIPASE, AMYLASE in the last 168 hours. No results for input(s): AMMONIA in the last 168 hours. CBC: Recent Labs  Lab 05/23/19 0434 05/24/19 0400 05/25/19 0910 05/25/19 1728 05/26/19 0440 05/27/19 0416  WBC 13.4* 13.2* 14.7*  --  13.7* 11.0*  NEUTROABS  --   --  11.9*  --   --   --   HGB 7.7* 8.0* 6.7* 7.8* 7.5* 7.3*  HCT 25.5* 26.3* 21.9* 25.5* 24.4* 24.0*  MCV 97.3 98.1 99.5  --  99.2 99.6  PLT 176 174 181  --  205 183   Cardiac Enzymes: No results for input(s): CKTOTAL, CKMB, CKMBINDEX, TROPONINI in the  last 168 hours. CBG: Recent Labs  Lab 05/26/19 1544 05/26/19 2105 05/26/19 2349 05/27/19 0413 05/27/19 0754  GLUCAP 110* 104* 102* 88 92    Iron Studies:  No results for input(s): IRON, TIBC, TRANSFERRIN, FERRITIN in the last 72 hours. Studies/Results: Ct Head Wo Contrast  Result Date: 05/27/2019 CLINICAL DATA:  Follow-up stroke EXAM: CT HEAD WITHOUT CONTRAST TECHNIQUE: Contiguous axial images were obtained from the base of the skull through the vertex without intravenous contrast. COMPARISON:  05/22/2019.  MRI 05/23/2019 FINDINGS: Brain: Previously seen areas of acute infarction on MRI not appreciated by CT. No visible acute infarctions. There is atrophy, chronic small vessel disease. No hemorrhage or hydrocephalus. Vascular: No hyperdense vessel or unexpected calcification. Skull: No acute calvarial  abnormality. Sinuses/Orbits: Visualized paranasal sinuses and mastoids clear. Orbital soft tissues unremarkable. Other: None IMPRESSION: Previous areas of ischemic infarction seen on MRI not appreciated on CT. No hemorrhage or hydrocephalus. Atrophy, chronic microvascular disease. Electronically Signed   By: Rolm Baptise M.D.   On: 05/27/2019 00:32    Medications: Infusions: . sodium chloride 10 mL/hr at 05/27/19 0900  . sodium chloride    . sodium chloride    . cefTAZidime (FORTAZ)  IV Stopped (05/26/19 2231)  . lacosamide (VIMPAT) IV Stopped (05/27/19 3094)  . levETIRAcetam    . [START ON 05/29/2019] levETIRAcetam    . norepinephrine (LEVOPHED) Adult infusion Stopped (05/26/19 1748)  . propofol (DIPRIVAN) infusion Stopped (05/26/19 1542)    Scheduled Medications: . artificial tears  1 application Both Eyes M7W  . aspirin  81 mg Per Tube Daily  . atorvastatin  40 mg Oral q1800  . B-complex with vitamin C  1 tablet Per Tube Daily  . chlorhexidine gluconate (MEDLINE KIT)  15 mL Mouth Rinse BID  . Chlorhexidine Gluconate Cloth  6 each Topical Daily  . Chlorhexidine Gluconate Cloth  6 each Topical Q0600  . [START ON 05/30/2019] darbepoetin (ARANESP) injection - DIALYSIS  60 mcg Intravenous Q Tue-HD  . emtricitabine-tenofovir AF  1 tablet Per Tube Daily   And  . dolutegravir  50 mg Oral Daily  . feeding supplement (PRO-STAT SUGAR FREE 64)  60 mL Per Tube TID  . feeding supplement (VITAL AF 1.2 CAL)  1,000 mL Per Tube Q24H  . heparin injection (subcutaneous)  5,000 Units Subcutaneous Q8H  . insulin aspart  1-3 Units Subcutaneous Q4H  . mouth rinse  15 mL Mouth Rinse 10 times per day  . pantoprazole sodium  40 mg Per Tube QHS  . sodium chloride flush  3 mL Intravenous Q12H  . ticagrelor  90 mg Per Tube BID    have reviewed scheduled and prn medications.  Physical Exam: Unchanged General: Intubated, not responding. Heart:RRR, s1s2 nl, no rubs Lungs: Coarse breath sound bilateral, no  wheezing Abdomen:soft, Non-tender, non-distended Extremities: No lower extremity edema Dialysis Access: Left femoral temporary dialysis catheter, left upper extremity AV fistula  Lusero Nordlund Tanna Furry 05/27/2019,9:15 AM  LOS: 9 days  Pager: [phone]

## 2019-05-28 ENCOUNTER — Inpatient Hospital Stay (HOSPITAL_COMMUNITY): Payer: Medicare Other

## 2019-05-28 DIAGNOSIS — Z9911 Dependence on respirator [ventilator] status: Secondary | ICD-10-CM

## 2019-05-28 LAB — CBC
HCT: 22.6 % — ABNORMAL LOW (ref 39.0–52.0)
Hemoglobin: 6.9 g/dL — CL (ref 13.0–17.0)
MCH: 30.1 pg (ref 26.0–34.0)
MCHC: 30.5 g/dL (ref 30.0–36.0)
MCV: 98.7 fL (ref 80.0–100.0)
Platelets: 207 10*3/uL (ref 150–400)
RBC: 2.29 MIL/uL — ABNORMAL LOW (ref 4.22–5.81)
RDW: 18.7 % — ABNORMAL HIGH (ref 11.5–15.5)
WBC: 9.7 10*3/uL (ref 4.0–10.5)
nRBC: 0 % (ref 0.0–0.2)

## 2019-05-28 LAB — RENAL FUNCTION PANEL
Albumin: 2.3 g/dL — ABNORMAL LOW (ref 3.5–5.0)
Anion gap: 17 — ABNORMAL HIGH (ref 5–15)
BUN: 109 mg/dL — ABNORMAL HIGH (ref 8–23)
CO2: 20 mmol/L — ABNORMAL LOW (ref 22–32)
Calcium: 8.7 mg/dL — ABNORMAL LOW (ref 8.9–10.3)
Chloride: 101 mmol/L (ref 98–111)
Creatinine, Ser: 7.87 mg/dL — ABNORMAL HIGH (ref 0.61–1.24)
GFR calc Af Amer: 8 mL/min — ABNORMAL LOW (ref >60)
GFR calc non Af Amer: 6 mL/min — ABNORMAL LOW (ref >60)
Glucose, Bld: 96 mg/dL (ref 70–99)
Phosphorus: 5.1 mg/dL — ABNORMAL HIGH (ref 2.5–4.6)
Potassium: 4.7 mmol/L (ref 3.5–5.1)
Sodium: 138 mmol/L (ref 135–145)

## 2019-05-28 LAB — TRIGLYCERIDES: Triglycerides: 164 mg/dL — ABNORMAL HIGH (ref <150)

## 2019-05-28 LAB — GLUCOSE, CAPILLARY
Glucose-Capillary: 102 mg/dL — ABNORMAL HIGH (ref 70–99)
Glucose-Capillary: 90 mg/dL (ref 70–99)
Glucose-Capillary: 93 mg/dL (ref 70–99)
Glucose-Capillary: 94 mg/dL (ref 70–99)
Glucose-Capillary: 95 mg/dL (ref 70–99)
Glucose-Capillary: 97 mg/dL (ref 70–99)

## 2019-05-28 LAB — MAGNESIUM: Magnesium: 2.1 mg/dL (ref 1.7–2.4)

## 2019-05-28 LAB — HEMOGLOBIN AND HEMATOCRIT, BLOOD
HCT: 24 % — ABNORMAL LOW (ref 39.0–52.0)
Hemoglobin: 7.3 g/dL — ABNORMAL LOW (ref 13.0–17.0)

## 2019-05-28 MED ORDER — CHLORHEXIDINE GLUCONATE CLOTH 2 % EX PADS
6.0000 | MEDICATED_PAD | Freq: Every day | CUTANEOUS | Status: DC
  Administered 2019-05-28 – 2019-06-02 (×6): 6 via TOPICAL

## 2019-05-28 MED ORDER — SODIUM CHLORIDE 0.9 % IV SOLN
125.0000 mg | INTRAVENOUS | Status: DC
  Administered 2019-05-29 – 2019-05-31 (×2): 125 mg via INTRAVENOUS
  Filled 2019-05-28 (×4): qty 10

## 2019-05-28 NOTE — Plan of Care (Signed)
  Problem: Clinical Measurements: Goal: Respiratory complications will improve Outcome: Progressing   Problem: Clinical Measurements: Goal: Cardiovascular complication will be avoided Outcome: Progressing   Problem: Nutrition: Goal: Adequate nutrition will be maintained Outcome: Progressing   

## 2019-05-28 NOTE — Progress Notes (Addendum)
CRITICAL VALUE ALERT  Critical Value:  Hemoglobin 6.9, Creatinine 7.87  Date & Time Notied:  05/28/2019 @ 2500  Provider Notified: Warren Lacy CCM MD on call  Orders Received/Actions taken: Repeat Hemoglobin lab at 7am and notify nephrology of Cr trending. Please see Dr. Prudencio Burly notes for more details and orders.

## 2019-05-28 NOTE — Progress Notes (Signed)
Subjective: No significant change  Exam: Vitals:   05/28/19 0752 05/28/19 0808  BP:  126/77  Pulse:  93  Resp:  18  Temp: 100 F (37.8 C)   SpO2:  100%   Gen: In bed, NAD Resp: non-labored breathing, no acute distress Abd: soft, nt  Neuro: MS: Does not open eyes or follow commands. He does grimace to noxious stimlulation.  FE:OFHQR, corneals intact, eyes dysconjugate, no deviation, both move with oculocephalic, cough intact.  Motor: Flexion to noxious simulation in the left arm, flexion in bilateral legs. No movement right arm.  Sensory:as above.   Pertinent Labs: Cr 7.87  Impression: 63 yo M with anoxic brain injury as well as multifocal strokes in the setting of cardiac arrest.  I suspect multifocal emboli due to his cardiac arrest.  His exam is currently worse than I would expect based on his imaging which can happen with anoxic injury, however given the periodic pattern that were seen on EEG I do wonder about confounding factors.  Possible etiology of these discharges would include GPEDs due to anoxic injury, cefepime toxicity, or less likely there could be an ictal nature to them.  I favored suppressing these discharges for a time, however with lightening sedation they promptly returned unchanged which makes me think ictal nature is less likely.    At this point, I think it is probable he had some anoxic injury in addition to his strokes, but given the possibility of cephalosporin toxicity, I would favor giving a few more days to assess for improvement as he clears this.   Recommendations: 1) Currently on vimpat 150mg  BID(home medication) 2) Continue keppra at max dose for renal function(1g daily with 500mg  after dialysis)   Roland Rack, MD [facility] [phone]  If 7pm- 7am, please page neurology on call as listed in [place]. 05/28/2019  8:43 AM

## 2019-05-28 NOTE — Progress Notes (Signed)
**Jeffrey Jeffrey** Jeffrey Jeffrey  Assessment/ Plan: Pt is a 63 y.o. yo male with ESRD on home hemodialysis, history of hepatitis B, HIV, seizure disorder AVR, admitted with V. fib arrest found to have STEMI.  Cardiac angiogram with 80% distal LAD status post balloon angioplasty.  # ESRD: CRRT from 8/28-9/2.  Left femoral catheter placed on 8/28.   -Last intermittent hemodialysis on 9/3, tolerated well with UF 1 L.  Volume status acceptable.  Plan for next dialysis tomorrow.  Respiratory parameters improving. -off pressure.  # Anemia: received red blood cell transfusion.  Iron sat 23 %. Ordered IV iron for 3 days and aranesp on 9/1.  Transfuse as needed primary team.  Repeat hemoglobin 7.3.  # Secondary hyperparathyroidism: Calcium, phosphorus level acceptable.  # HTN/volume: Blood pressure acceptable, off Levophed. Volume status looks acceptable.  #Acute respiratory failure with hypoxia, vent dependent, FiO2 25%, PEEP 5.  #V. fib cardiac arrest with STEMI status post cardiac cath and stent placement to LAD.  #Acute metabolic encephalopathy, stroke: Monitor mental status. MRI with stroke and likely embolic phenomena.  Neurology is following.  Seems poor prognosis.  Dialysis Orders:  HHD - 4d/week, 3.5hr, EDW ~79kg, AVF with buttonholes. Last dialysis 8/26. Outpatient Nephrologist is Dr. Holley Raring  Subjective: Seen and examined ICU.  No overnight DC.  Remains nonresponsive.  On ventilator, off pressor.  Objective Vital signs in last 24 hours: Vitals:   05/28/19 0500 05/28/19 0700 05/28/19 0752 05/28/19 0808  BP: 116/72 133/86  126/77  Pulse: 86 94  93  Resp: _0 Temp:   100 F (37.8 C)   TempSrc:   Oral   SpO2: 100% 100%  100%  Weight: 89.3 kg     Height:       Weight change: 2.7 kg  Intake/Output Summary (Last 24 hours) at 05/28/2019 0848 Last data filed at 05/27/2019 2300 Gross per 24 hour  Intake 647.53 ml  Output 0 ml  Net 647.53 ml        Labs: Basic Metabolic Panel: Recent Labs  Lab 05/26/19 0440 05/27/19 0416 05/28/19 0419  NA 136 137 138  K 4.0 4.3 4.7  CL 101 101 101  CO2 21* 20* 20*  GLUCOSE 109* 95 96  BUN 64* 66* 109*  CREATININE 5.63* 5.60* 7.87*  CALCIUM 8.7* 8.3* 8.7*  PHOS 3.9 4.0 5.1*   Liver Function Tests: Recent Labs  Lab 05/25/19 0319 05/26/19 0440 05/27/19 0416 05/28/19 0419  AST 25  --   --   --   ALT 17  --   --   --   ALKPHOS 71  --   --   --   BILITOT 0.4  --   --   --   PROT 6.6  --   --   --   ALBUMIN 2.6* 2.3* 2.4* 2.3*   No results for input(s): LIPASE, AMYLASE in the last 168 hours. No results for input(s): AMMONIA in the last 168 hours. CBC: Recent Labs  Lab 05/24/19 0400 05/25/19 0910  05/26/19 0440 05/27/19 0416 05/28/19 0419 05/28/19 0656  WBC 13.2* 14.7*  --  13.7* 11.0* 9.7  --   NEUTROABS  --  11.9*  --   --   --   --   --   HGB 8.0* 6.7*   < > 7.5* 7.3* 6.9* 7.3*  HCT 26.3* 21.9*   < > 24.4* 24.0* 22.6* 24.0*  MCV 98.1 99.5  --  99.2 99.6 98.7  --  PLT 174 181  --  205 183 207  --    < > = values in this interval not displayed.   Cardiac Enzymes: No results for input(s): CKTOTAL, CKMB, CKMBINDEX, TROPONINI in the last 168 hours. CBG: Recent Labs  Lab 05/27/19 1107 05/27/19 1538 05/27/19 1958 05/27/19 2347 05/28/19 0407  GLUCAP 88 90 97 90 97    Iron Studies:  No results for input(s): IRON, TIBC, TRANSFERRIN, FERRITIN in the last 72 hours. Studies/Results: Ct Head Wo Contrast  Result Date: 05/27/2019 CLINICAL DATA:  Follow-up stroke EXAM: CT HEAD WITHOUT CONTRAST TECHNIQUE: Contiguous axial images were obtained from the base of the skull through the vertex without intravenous contrast. COMPARISON:  05/22/2019.  MRI 05/23/2019 FINDINGS: Brain: Previously seen areas of acute infarction on MRI not appreciated by CT. No visible acute infarctions. There is atrophy, chronic small vessel disease. No hemorrhage or hydrocephalus. Vascular: No  hyperdense vessel or unexpected calcification. Skull: No acute calvarial abnormality. Sinuses/Orbits: Visualized paranasal sinuses and mastoids clear. Orbital soft tissues unremarkable. Other: None IMPRESSION: Previous areas of ischemic infarction seen on MRI not appreciated on CT. No hemorrhage or hydrocephalus. Atrophy, chronic microvascular disease. Electronically Signed   By: Rolm Baptise M.D.   On: 05/27/2019 00:32    Medications: Infusions: . sodium chloride    . sodium chloride    . lacosamide (VIMPAT) IV 150 mg (05/28/19 0504)  . levETIRAcetam Stopped (05/27/19 1019)  . [START ON 05/29/2019] levETIRAcetam      Scheduled Medications: . aspirin  81 mg Per Tube Daily  . atorvastatin  40 mg Per Tube q1800  . B-complex with vitamin C  1 tablet Per Tube Daily  . chlorhexidine gluconate (MEDLINE KIT)  15 mL Mouth Rinse BID  . Chlorhexidine Gluconate Cloth  6 each Topical Daily  . Chlorhexidine Gluconate Cloth  6 each Topical Q0600  . [START ON 05/29/2019] darbepoetin (ARANESP) injection - DIALYSIS  60 mcg Intravenous Q Mon-HD  . emtricitabine-tenofovir AF  1 tablet Per Tube Daily   And  . dolutegravir  50 mg Oral Daily  . feeding supplement (PRO-STAT SUGAR FREE 64)  60 mL Per Tube TID  . feeding supplement (VITAL AF 1.2 CAL)  1,000 mL Per Tube Q24H  . heparin injection (subcutaneous)  5,000 Units Subcutaneous Q8H  . insulin aspart  1-3 Units Subcutaneous Q4H  . mouth rinse  15 mL Mouth Rinse 10 times per day  . metoprolol tartrate  12.5 mg Per Tube BID  . pantoprazole sodium  40 mg Per Tube QHS  . ticagrelor  90 mg Per Tube BID    have reviewed scheduled and prn medications.  Physical Exam: Unchanged General: Intubated, not responding. Heart:RRR, s1s2 nl, no rubs Lungs: Coarse conducted breath sound bilateral, no wheezing Abdomen:soft, Non-tender, non-distended Extremities: No lower extremity edema Dialysis Access: Left femoral temporary dialysis catheter, left upper extremity AV  fistula  Jeffrey Jeffrey 05/28/2019,8:48 AM  LOS: 10 days  Pager: [phone]

## 2019-05-28 NOTE — Progress Notes (Signed)
NAME:  Jeffrey [name], MRN:  [id number], DOB:  12-Mar-1956, LOS: 79 ADMISSION DATE:  05/10/2019, CONSULTATION DATE:  8/27  REFERRING MD: 8/27, CHIEF COMPLAINT: Cardiac arrest  Brief History   63 year old M admitted to [facility] on 8/27 status post V. fib arrest with an estimated downtime of 35 minutes before ROSC.  Completed TTM protocol.  Neuro prognostication pending.  Past Medical History  HIV Diffuse nonobstructive CAD Hepatitis B ESRD on home HD Hypothyroidism Peripheral vascular disease Prior GI bleed Aortic valve insufficiency s/p aortic valve endocarditis requiring replacement in 2019 Left VATS for fibrothorax following bacteremia in 10/2018  Significant Hospital Events   8/27  Admit with VF arrest, ~35 minute downtime 8/28  Re-warming, pressor dependent, EEG neg for seizure, amio stopped d/t brady, HD  8/29  Off Levophed, rewarmed 0330, off sedation  8/30  Heparin gtt initiated with EKG changes concerning for ischemia  8/31  Heparin gtt stopped  9/02  CVVHD stopped  9/05  Not waking, abnormal neurological exam   Consults:  Cardiology Neurology Nephrology  Procedures:  ETT 8/27 >>  L IJ CVL 8/27 >>  L Femoral HD 8/28 >>   Significant Diagnostic Tests:  ECHO 8/27 >> LVEF 45 to 50% with left ventricular wall dyskinesis, and AF at time of exam EEG 8/27 >> negative for seizure LHC 8/28 >> mid LAD lesion 100% stenosis, drug-eluting stent placed, distal apical LAD lesion stenosis 80% treated with PTCA, nonobstructive disease involving the circumflex and RCA EEG 8/30 >> frequent sharply contoured generalized discharges with triphasic morphology.  Irritability versus cefepime neurotoxicity CT Head 8/31 >> small vessel white matter disease MRI 9/1 >> patchy multifocal acute ischemic infarcts involving the bilateral cerebral and cerebellar hemispheres-suspicious for central thromboembolic etiology.  Petechial hemorrhage about 1 of these infarcts of the right parietal cortex.   Underlying atrophy with chronic microvascular disease EEG 9/3 >> improved triphasic waves following initiation of antiseizure medications EEG 9/4 >> return of triphasic waves with propofol wean  Micro Data:  COVID 8/27 >> negative  Tracheal aspirate 8/28 >> moderate pseudomonas aeruginosa >> S-ceftaz           Serratia marcescens >> R- cefazolin, S-ceftaz Antimicrobials:  Cefepime 8/29 >> 9/2 Ceftazidime 9/2 >> 9/5  Interim history/subjective:  No acute events overnight.  Sedation remains off.  Dr. Leonel Ramsay discussed his care with his brother this morning.  Objective   Blood pressure (!) 148/85, pulse 100, temperature 100 F (37.8 C), temperature source Oral, resp. rate (!) 33, height 6' (1.829 m), weight 89.3 kg, SpO2 99 %. CVP:  [10 mmHg-32 mmHg] 22 mmHg  Vent Mode: CPAP;PSV FiO2 (%):  [25 %] 25 % Set Rate:  [14 bmp] 14 bmp Vt Set:  [620 mL] 620 mL PEEP:  [5 cmH20] 5 cmH20 Pressure Support:  [15 cmH20-16 cmH20] 15 cmH20 Plateau Pressure:  [11 cmH20-17 cmH20] 15 cmH20   Intake/Output Summary (Last 24 hours) at 05/28/2019 1134 Last data filed at 05/28/2019 0900 Gross per 24 hour  Intake 890.05 ml  Output 0 ml  Net 890.05 ml   Filed Weights   05/26/19 0116 05/27/19 0417 05/28/19 0500  Weight: 86.3 kg 86.6 kg 89.3 kg    Examination: General: Critically ill, intubated lying in bed comfortably. HENT: Normocephalic, atraumatic, mucous membranes moist.  Large protuberant tongue sticking out from his mouth-unchanged from previous exam. Eyes: Anicteric, no scleral edema Neuro: Eyes not spontaneously open today or with painful stimulation.  PERRL, + corneal reflex. No gag, +  cough.  Exam performed on no sedation. CV: Regular rate and rhythm, systolic murmur at the cardiac apex. PULM: Tachypneic on pressure support 5/5, when pressure support was returned to 16 he was breathing comfortably on the vent.  Clear to auscultation bilaterally GI: Soft, nontender, hypoactive bowel  sounds Extremities: Warm, dry, no edema or clubbing Skin: No rashes or wounds  Resolved Hospital Problem list   Shock - initial component cardiogenic +/- distributive with PNA  Assessment & Plan:   VF Arrest  -approx 35 minute downtime before ROSC -post TTM  P: -Continue supportive ICU care-normothermia, normocapnia, euglycemia -Head of bed greater than 30 degrees  Acute Encephalopathy  -concern for hypoxic / metabolic  Suspected Anoxic Brain Injury + Multifocal Strokes  -in setting of cardiac arrest P: -Appreciate neurology's input.  Awaiting washout of antibiotics -Continue supportive care and delirium precautions -Continue Keppra, valproate, Vimpat - Continue to avoid sedating medications  Severe CAD  -s/p DES to Mid LAD and PTCA to distal LAD lesion 8/27  PAF  -did not tolerated amiodarone d/t bradycardia   P: -Continue aspirin & ticagrelor, statin - Depending on neurologic recovery will require additional cardiac intervention possibly  Acute Hypoxic Respiratory Failure  -in setting of cardiac arrest, pseudomonal / serratia PNA -cefepime stopped due to EEG results Pseudomonal / Serratia PNA  P: -Continue supportive ventilation.  Failed SBT on 5 for pressure support today due to tachypnea with respiratory rate greater than 35. -Continue to monitor for recurrence of pneumonia. -VAP prevention protocol  ESRD  -on HD at home  P: -Appreciate nephrology's assistance.  Planning on dialysis tomorrow.   Anemia  -chronic disease + superimposed acute illness  -1 unit PRBC 8/31 P: -Continue to monitor - With stable coronary disease, recommend transfusion for hemoglobin less than 7 unless active bleeding or active ischemia.  HIV  -on Biktarvy  Hepatitis B P: -Continue substituted home regimen  Hyperglycemia, resolved.  Not requiring insulin recently P: -Accu-Cheks every 4 with sliding scale insulin if required  At Risk Malnutrition  P: -Continue tube  feeds  Best practice:  Diet: TF Pain/Anxiety/Delirium protocol (if indicated): Minimize sedating medications for neuro exam VAP protocol (if indicated): In place  DVT prophylaxis: SQ Heparin  GI prophylaxis: PPI  Glucose control: SSI  Mobility: BR  Code Status: Full Code  Family Communication: Brother and sister are decision-makers. L/m for brother 9/6. Disposition: ICU   Labs   CBC: Recent Labs  Lab 05/24/19 0400 05/25/19 0910 05/25/19 1728 05/26/19 0440 05/27/19 0416 05/28/19 0419 05/28/19 0656  WBC 13.2* 14.7*  --  13.7* 11.0* 9.7  --   NEUTROABS  --  11.9*  --   --   --   --   --   HGB 8.0* 6.7* 7.8* 7.5* 7.3* 6.9* 7.3*  HCT 26.3* 21.9* 25.5* 24.4* 24.0* 22.6* 24.0*  MCV 98.1 99.5  --  99.2 99.6 98.7  --   PLT 174 181  --  205 183 207  --     Basic Metabolic Panel: Recent Labs  Lab 05/24/19 0400 05/25/19 0319 05/26/19 0440 05/27/19 0416 05/28/19 0419  NA 137 136 136 137 138  K 4.0 4.2 4.0 4.3 4.7  CL 102 100 101 101 101  CO2 22 23 21* 20* 20*  GLUCOSE 108* 120* 109* 95 96  BUN 19 35* 64* 66* 109*  CREATININE 1.88* 3.18* 5.63* 5.60* 7.87*  CALCIUM 8.4* 8.4* 8.7* 8.3* 8.7*  MG 2.4 2.3 2.2 2.2 2.1  PHOS 1.6*  2.6 3.9 4.0 5.1*   GFR: Estimated Creatinine Clearance: 10.3 mL/min (A) (by C-G formula based on SCr of 7.87 mg/dL (H)). Recent Labs  Lab 05/22/19 1347 05/22/19 1722  05/25/19 0910 05/26/19 0440 05/27/19 0416 05/28/19 0419  WBC  --   --    < > 14.7* 13.7* 11.0* 9.7  LATICACIDVEN 1.2 0.9  --   --   --   --   --    < > = values in this interval not displayed.    Liver Function Tests: Recent Labs  Lab 05/24/19 0400 05/25/19 0319 05/26/19 0440 05/27/19 0416 05/28/19 0419  AST  --  25  --   --   --   ALT  --  17  --   --   --   ALKPHOS  --  71  --   --   --   BILITOT  --  0.4  --   --   --   PROT  --  6.6  --   --   --   ALBUMIN 2.7* 2.6* 2.3* 2.4* 2.3*   No results for input(s): LIPASE, AMYLASE in the last 168 hours. No results for  input(s): AMMONIA in the last 168 hours.  ABG    Component Value Date/Time   PHART 7.478 (H) 05/21/2019 0507   PCO2ART 37.0 05/21/2019 0507   PO2ART 108.0 05/21/2019 0507   HCO3 27.5 05/21/2019 0507   TCO2 29 05/21/2019 0507   ACIDBASEDEF 3.0 (H) 05/20/2019 0456   O2SAT 99.0 05/21/2019 0507     Coagulation Profile: No results for input(s): INR, PROTIME in the last 168 hours.  Cardiac Enzymes: No results for input(s): CKTOTAL, CKMB, CKMBINDEX, TROPONINI in the last 168 hours.  HbA1C: Hgb A1c MFr Bld  Date/Time Value Ref Range Status  05/24/2019 04:00 AM 4.7 (L) 4.8 - 5.6 % Final    Comment:    (NOTE) Pre diabetes:          5.7%-6.4% Diabetes:              >6.4% Glycemic control for   <7.0% adults with diabetes   07/26/2018 02:59 PM 4.2 (L) 4.8 - 5.6 % Final    Comment:    (NOTE) Pre diabetes:          5.7%-6.4% Diabetes:              >6.4% Glycemic control for   <7.0% adults with diabetes     CBG: Recent Labs  Lab 05/27/19 1538 05/27/19 1958 05/27/19 2347 05/28/19 0407 05/28/19 0750  GLUCAP 90 97 90 97 93     This patient is critically ill with multiple organ system failure which requires frequent high complexity decision making, assessment, support, evaluation, and titration of therapies. This was completed through the application of advanced monitoring technologies and extensive interpretation of multiple databases. During this encounter critical care time was devoted to patient care services described in this note for 35 minutes.  Julian Hy, DO 05/28/19 11:42 AM [facility]

## 2019-05-28 NOTE — Progress Notes (Signed)
[facility] Progress Note Patient Name: Jeffrey [name] DOB: Nov 24, 1955 MRN: [id number]   Date of Service  05/28/2019  HPI/Events of Note  Hg 6.9. discussed with bed side RN. Creatinine > 7 from 5. Off of CRRT since 3 rd. No active bleeding or on pressors. K OK.   eICU Interventions  - follow Hg at 7 am Notify Nephrology team for any re starting CRRT, if so transfuse during that time.      Intervention Category Intermediate Interventions: Diagnostic test evaluation  Elmer Sow 05/28/2019, 6:03 AM

## 2019-05-28 NOTE — Plan of Care (Signed)
  Problem: Clinical Measurements: Goal: Respiratory complications will improve Outcome: Progressing   Problem: Clinical Measurements: Goal: Cardiovascular complication will be avoided Outcome: Progressing   Problem: Activity: Goal: Ability to tolerate increased activity will improve Outcome: Progressing   Problem: Skin Integrity: Goal: Risk for impaired skin integrity will decrease Outcome: Progressing

## 2019-05-28 NOTE — Progress Notes (Signed)
CRITICAL VALUE ALERT  Critical Value:  Hemoglobin 6.9, Creatinine 7.87  Date & Time Notied:  05/28/19 @ 0607  Provider Notified: Dr. Jonnie Finner (Nephrology)  Orders Received/Actions taken: MD stated nephrologist will assess patient renal function during morning rounds today.

## 2019-05-29 DIAGNOSIS — G92 Toxic encephalopathy: Secondary | ICD-10-CM

## 2019-05-29 LAB — GLUCOSE, CAPILLARY
Glucose-Capillary: 90 mg/dL (ref 70–99)
Glucose-Capillary: 96 mg/dL (ref 70–99)
Glucose-Capillary: 123 mg/dL — ABNORMAL HIGH (ref 70–99)
Glucose-Capillary: 104 mg/dL — ABNORMAL HIGH (ref 70–99)
Glucose-Capillary: 101 mg/dL — ABNORMAL HIGH (ref 70–99)

## 2019-05-29 LAB — RENAL FUNCTION PANEL
Albumin: 2.3 g/dL — ABNORMAL LOW (ref 3.5–5.0)
Anion gap: 19 — ABNORMAL HIGH (ref 5–15)
BUN: 132 mg/dL — ABNORMAL HIGH (ref 8–23)
CO2: 20 mmol/L — ABNORMAL LOW (ref 22–32)
Calcium: 8.7 mg/dL — ABNORMAL LOW (ref 8.9–10.3)
Chloride: 100 mmol/L (ref 98–111)
Creatinine, Ser: 9.67 mg/dL — ABNORMAL HIGH (ref 0.61–1.24)
GFR calc Af Amer: 6 mL/min — ABNORMAL LOW (ref >60)
GFR calc non Af Amer: 5 mL/min — ABNORMAL LOW (ref >60)
Glucose, Bld: 104 mg/dL — ABNORMAL HIGH (ref 70–99)
Phosphorus: 5.2 mg/dL — ABNORMAL HIGH (ref 2.5–4.6)
Potassium: 4.8 mmol/L (ref 3.5–5.1)
Sodium: 139 mmol/L (ref 135–145)

## 2019-05-29 LAB — CBC
HCT: 22.7 % — ABNORMAL LOW (ref 39.0–52.0)
Hemoglobin: 7 g/dL — ABNORMAL LOW (ref 13.0–17.0)
MCH: 30.7 pg (ref 26.0–34.0)
MCHC: 30.8 g/dL (ref 30.0–36.0)
MCV: 99.6 fL (ref 80.0–100.0)
Platelets: 228 10*3/uL (ref 150–400)
RBC: 2.28 MIL/uL — ABNORMAL LOW (ref 4.22–5.81)
RDW: 18.8 % — ABNORMAL HIGH (ref 11.5–15.5)
WBC: 10.2 10*3/uL (ref 4.0–10.5)
nRBC: 0 % (ref 0.0–0.2)

## 2019-05-29 LAB — MAGNESIUM: Magnesium: 2.2 mg/dL (ref 1.7–2.4)

## 2019-05-29 MED ORDER — HEPARIN SODIUM (PORCINE) 1000 UNIT/ML IJ SOLN
INTRAMUSCULAR | Status: AC
  Filled 2019-05-29: qty 4

## 2019-05-29 MED ORDER — HEPARIN SODIUM (PORCINE) 1000 UNIT/ML DIALYSIS: 20.0000 [IU]/kg | INTRAMUSCULAR | Status: DC | PRN

## 2019-05-29 MED ORDER — SODIUM CHLORIDE 0.9 % IV SOLN: 100.0000 mL | INTRAVENOUS | Status: DC | PRN

## 2019-05-29 MED ORDER — DARBEPOETIN ALFA 60 MCG/0.3ML IJ SOSY
PREFILLED_SYRINGE | INTRAMUSCULAR | Status: AC
  Administered 2019-05-29: 60 ug via INTRAVENOUS
  Filled 2019-05-29: qty 0.3

## 2019-05-29 MED ORDER — ALTEPLASE 2 MG IJ SOLR: 2.0000 mg | Freq: Once | INTRAMUSCULAR | Status: DC | PRN

## 2019-05-29 MED ORDER — METOPROLOL TARTRATE 25 MG PO TABS
25.0000 mg | ORAL_TABLET | Freq: Two times a day (BID) | ORAL | Status: DC
  Administered 2019-05-29 – 2019-06-01 (×7): 25 mg
  Filled 2019-05-29 (×8): qty 1

## 2019-05-29 MED ORDER — LIDOCAINE HCL (PF) 1 % IJ SOLN: 5.0000 mL | INTRAMUSCULAR | Status: DC | PRN

## 2019-05-29 MED ORDER — HEPARIN SODIUM (PORCINE) 1000 UNIT/ML DIALYSIS: 1000.0000 [IU] | INTRAMUSCULAR | Status: DC | PRN

## 2019-05-29 MED ORDER — INSULIN ASPART 100 UNIT/ML ~~LOC~~ SOLN: 1.0000 [IU] | Freq: Two times a day (BID) | SUBCUTANEOUS | Status: DC

## 2019-05-29 MED ORDER — PENTAFLUOROPROP-TETRAFLUOROETH EX AERO: 1.0000 "application " | INHALATION_SPRAY | CUTANEOUS | Status: DC | PRN

## 2019-05-29 MED ORDER — LIDOCAINE-PRILOCAINE 2.5-2.5 % EX CREA: 1.0000 "application " | TOPICAL_CREAM | CUTANEOUS | Status: DC | PRN

## 2019-05-29 NOTE — Progress Notes (Signed)
Patient ID: Jeffrey [name], male   DOB: August 06, 1956, 63 y.o.   MRN: [id number]  [facility] Progress Note   Assessment/ Plan:   1.  Acute hypoxic respiratory failure status post V. fib cardiac arrest: Secondary to LAD STEMI now status post DES to LAD with a background history of bioprosthetic aortic valve replacement.  Remains ventilator dependent with encephalopathy likely indicative of significant anoxic injury. 2. ESRD: Earlier on CRRT due to hemodynamic instability from 8/28-9/2.  He has tolerated intermittent hemodialysis well since then and is undergoing bedside hemodialysis when seen today without any problems.  His left radiocephalic fistula has been cannulated. 3. Anemia: Slight drift of hemoglobin/hematocrit noted which appears to have stabilized without any overt blood loss.  Continue to follow for indications for transfusion if hemoglobin <7 g/deciliter. 4. CKD-MBD: Mild hyperphosphatemia with NPO status.  We will continue to follow with hemodialysis. 5. Nutrition: Continue enteral nutritional support per CCM. 6.  Encephalopathy: Metabolic and likely secondary to anoxic injury from V. fib arrest.  Prognosis poor.  Subjective:   Without acute events noted overnight, getting hemodialysis at this time.   Objective:   BP 127/85   Pulse 99   Temp 99.6 F (37.6 C) (Oral)   Resp 19   Ht 6' (1.829 m)   Wt 88.3 kg   SpO2 99%   BMI 26.40 kg/m   Physical Exam: Gen: Somnolent, unresponsive, on hemodialysis CVS: Pulse regular rhythm, normal rate, S1 and S2 normal Resp: Anteriorly coarse breath sounds, no distinct rales or rhonchi Abd: Soft, obese, nontender Ext: Trace edema over lower extremities.  Left RCF cannulated.  Labs: BMET Recent Labs  Lab 05/23/19 1548 05/24/19 0400 05/25/19 0319 05/26/19 0440 05/27/19 0416 05/28/19 0419 05/29/19 0346  NA 136 137 136 136 137 138 139  K 4.7 4.0 4.2 4.0 4.3 4.7 4.8  CL 100 102 100 101 101 101 100  CO2 _0 21* 20*  20* 20*  GLUCOSE 125* 108* 120* 109* 95 96 104*  BUN 24* 19 35* 64* 66* 109* 132*  CREATININE 1.88* 1.88* 3.18* 5.63* 5.60* 7.87* 9.67*  CALCIUM 8.4* 8.4* 8.4* 8.7* 8.3* 8.7* 8.7*  PHOS 3.1 1.6* 2.6 3.9 4.0 5.1* 5.2*   CBC Recent Labs  Lab 05/25/19 0910  05/26/19 0440 05/27/19 0416 05/28/19 0419 05/28/19 0656 05/29/19 0346  WBC 14.7*  --  13.7* 11.0* 9.7  --  10.2  NEUTROABS 11.9*  --   --   --   --   --   --   HGB 6.7*   < > 7.5* 7.3* 6.9* 7.3* 7.0*  HCT 21.9*   < > 24.4* 24.0* 22.6* 24.0* 22.7*  MCV 99.5  --  99.2 99.6 98.7  --  99.6  PLT 181  --  205 183 207  --  228   < > = values in this interval not displayed.   Medications:    . aspirin  81 mg Per Tube Daily  . atorvastatin  40 mg Per Tube q1800  . B-complex with vitamin C  1 tablet Per Tube Daily  . chlorhexidine gluconate (MEDLINE KIT)  15 mL Mouth Rinse BID  . Chlorhexidine Gluconate Cloth  6 each Topical Q0600  . Darbepoetin Alfa      . darbepoetin (ARANESP) injection - DIALYSIS  60 mcg Intravenous Q Mon-HD  . emtricitabine-tenofovir AF  1 tablet Per Tube Daily   And  . dolutegravir  50 mg Oral Daily  . feeding supplement (  PRO-STAT SUGAR FREE 64)  60 mL Per Tube TID  . feeding supplement (VITAL AF 1.2 CAL)  1,000 mL Per Tube Q24H  . heparin      . heparin injection (subcutaneous)  5,000 Units Subcutaneous Q8H  . insulin aspart  1-3 Units Subcutaneous Q4H  . mouth rinse  15 mL Mouth Rinse 10 times per day  . metoprolol tartrate  12.5 mg Per Tube BID  . pantoprazole sodium  40 mg Per Tube QHS  . ticagrelor  90 mg Per Tube BID   Elmarie Shiley, MD 05/29/2019, 8:39 AM

## 2019-05-29 NOTE — Progress Notes (Addendum)
Neurology Progress Note   S:// Patient seen and examined.  No acute overnight events. Getting hemodialysis.  Tolerating hemodialysis without problems at this time.  O:// Current vital signs: BP (!) 141/85   Pulse 85   Temp 99.6 F (37.6 C) (Oral)   Resp 19   Ht 6' (1.829 m)   Wt 88.3 kg   SpO2 100%   BMI 26.40 kg/m  Vital signs in last 24 hours: Temp:  [99.2 F (37.3 C)-100.6 F (38.1 C)] 99.6 F (37.6 C) (09/07 0810) Pulse Rate:  [80-100] 85 (09/07 0800) Resp:  [12-33] 19 (09/07 0800) BP: (112-169)/(69-96) 141/85 (09/07 0800) SpO2:  [93 %-100 %] 100 % (09/07 0800) FiO2 (%):  [21 %] 21 % (09/07 0400) Weight:  [87.9 kg-88.3 kg] 88.3 kg (09/07 0735) Neurological exam Does not open eyes to command and does not follow commands.  He does grimace to noxious stimulation and appears to keep his eyes closed when attempted to open. Cranial nerves: Pupils equal round react light, corneals intact, disconjugate eyes, positive oculocephalics, cough and gag intact. Motor exam: No movement appreciated in the right arm.  No response to noxious stimulation in the right arm.  Some withdrawal in the left arm to noxious immolation.  Flexion response in both legs to noxious immolation. Sensory exam as above  Medications  Current Facility-Administered Medications:  .  0.9 %  sodium chloride infusion, 100 mL, Intravenous, PRN, Rosita Fire, MD .  0.9 %  sodium chloride infusion, 100 mL, Intravenous, PRN, Rosita Fire, MD .  acetaminophen (TYLENOL) solution 650 mg, 650 mg, Per Tube, Q6H PRN, Mannam, Praveen, MD, 650 mg at 05/29/19 0044 .  albuterol (PROVENTIL) (2.5 MG/3ML) 0.083% nebulizer solution 2.5 mg, 2.5 mg, Nebulization, Q6H PRN, Larae Grooms S, MD .  alteplase (CATHFLO ACTIVASE) injection 2 mg, 2 mg, Intracatheter, Once PRN, Rosita Fire, MD .  aspirin chewable tablet 81 mg, 81 mg, Per Tube, Daily, Kipp Brood, MD, 81 mg at 05/28/19 0914 .  atorvastatin  (LIPITOR) tablet 40 mg, 40 mg, Per Tube, q1800, Einar Grad, RPH, 40 mg at 05/28/19 1632 .  B-complex with vitamin C tablet 1 tablet, 1 tablet, Per Tube, Daily, Mannam, Praveen, MD, 1 tablet at 05/28/19 0913 .  chlorhexidine gluconate (MEDLINE KIT) (PERIDEX) 0.12 % solution 15 mL, 15 mL, Mouth Rinse, BID, Agarwala, Ravi, MD, 15 mL at 05/28/19 1943 .  Chlorhexidine Gluconate Cloth 2 % PADS 6 each, 6 each, Topical, Q0600, Rosita Fire, MD, 6 each at 05/29/19 0520 .  Darbepoetin Alfa (ARANESP) 60 MCG/0.3ML injection, , , ,  .  Darbepoetin Alfa (ARANESP) injection 60 mcg, 60 mcg, Intravenous, Q Mon-HD, Bhandari, Osie Bond, MD .  emtricitabine-tenofovir AF (DESCOVY) 200-25 MG per tablet 1 tablet, 1 tablet, Per Tube, Daily, 1 tablet at 05/28/19 0916 **AND** dolutegravir (TIVICAY) tablet 50 mg, 50 mg, Oral, Daily, Bowser, Grace E, NP, 50 mg at 05/28/19 0916 .  feeding supplement (PRO-STAT SUGAR FREE 64) liquid 60 mL, 60 mL, Per Tube, TID, Rigoberto Noel, MD, 60 mL at 05/28/19 2213 .  feeding supplement (VITAL AF 1.2 CAL) liquid 1,000 mL, 1,000 mL, Per Tube, Q24H, Rigoberto Noel, MD, Last Rate: 30 mL/hr at 05/28/19 1632, 1,000 mL at 05/28/19 1632 .  fentaNYL (SUBLIMAZE) injection 25-50 mcg, 25-50 mcg, Intravenous, Q2H PRN, Ollis, Brandi L, NP .  ferric gluconate (NULECIT) 125 mg in sodium chloride 0.9 % 100 mL IVPB, 125 mg, Intravenous, Q M,W,F-HD, Rosita Fire,  MD .  heparin 1000 UNIT/ML injection, , , ,  .  heparin injection 1,000 Units, 1,000 Units, Dialysis, PRN, Rosita Fire, MD .  heparin injection 1,800 Units, 20 Units/kg, Dialysis, PRN, Rosita Fire, MD .  heparin injection 5,000 Units, 5,000 Units, Subcutaneous, Q8H, Mannam, Praveen, MD, 5,000 Units at 05/29/19 0519 .  insulin aspart (novoLOG) injection 1-3 Units, 1-3 Units, Subcutaneous, Q4H, Agarwala, Ravi, MD, 1 Units at 05/25/19 1622 .  lacosamide (VIMPAT) 150 mg in sodium chloride 0.9 % 25 mL IVPB,  150 mg, Intravenous, Q12H, Greta Doom, MD, Last Rate: 80 mL/hr at 05/29/19 0529, 150 mg at 05/29/19 0529 .  levETIRAcetam (KEPPRA) IVPB 1000 mg/100 mL premix, 1,000 mg, Intravenous, Q24H, Greta Doom, MD, Stopped at 05/28/19 [phone] .  levETIRAcetam (KEPPRA) IVPB 500 mg/100 mL premix, 500 mg, Intravenous, Q M,W,F-1800, Kirkpatrick, McNeill P, MD .  lidocaine (PF) (XYLOCAINE) 1 % injection 5 mL, 5 mL, Intradermal, PRN, Rosita Fire, MD .  lidocaine-prilocaine (EMLA) cream 1 application, 1 application, Topical, PRN, Rosita Fire, MD .  MEDLINE mouth rinse, 15 mL, Mouth Rinse, 10 times per day, Kipp Brood, MD, 15 mL at 05/29/19 0530 .  metoprolol tartrate (LOPRESSOR) injection 2.5-5 mg, 2.5-5 mg, Intravenous, Q3H PRN, Mannam, Praveen, MD, 2.5 mg at 05/23/19 1941 .  metoprolol tartrate (LOPRESSOR) tablet 12.5 mg, 12.5 mg, Per Tube, BID, Ronna Polio, RPH, 12.5 mg at 05/28/19 2214 .  ondansetron (ZOFRAN) injection 4 mg, 4 mg, Intravenous, Q6H PRN, Jettie Booze, MD .  pantoprazole sodium (PROTONIX) 40 mg/20 mL oral suspension 40 mg, 40 mg, Per Tube, QHS, Brand Males, MD, 40 mg at 05/28/19 2215 .  pentafluoroprop-tetrafluoroeth (GEBAUERS) aerosol 1 application, 1 application, Topical, PRN, Rosita Fire, MD .  sodium chloride 0.9 % primer fluid for CRRT, , CRRT, PRN, Edrick Oh, MD .  ticagrelor [facility]) tablet 90 mg, 90 mg, Per Tube, BID, Kipp Brood, MD, 90 mg at 05/28/19 2214 .  white petrolatum (VASELINE) gel, , Topical, PRN, Mannam, Praveen, MD  Facility-Administered Medications Ordered in Other Encounters:  .  etomidate (AMIDATE) injection, , , Anesthesia Intra-op, Myna Bright, CRNA, 16 mg at 07/10/18 2031 .  rocuronium bromide 10 mg/mL (PF) syringe, , Intravenous, Anesthesia Michelle Nasuti, CRNA, 50 mg at 07/10/18 2031 Labs CBC    Component Value Date/Time   WBC 10.2 05/29/2019 0346   RBC 2.28 (L)  05/29/2019 0346   HGB 7.0 (L) 05/29/2019 0346   HGB 9.7 (L) 05/09/2018 0937   HGB 10.0 (L) 11/18/2016 1036   HCT 22.7 (L) 05/29/2019 0346   HCT 29.8 (L) 05/09/2018 0937   HCT 29.9 (L) 11/18/2016 1036   PLT 228 05/29/2019 0346   PLT 180 05/09/2018 0937   MCV 99.6 05/29/2019 0346   MCV 91 05/09/2018 0937   MCV 95.6 11/18/2016 1036   MCH 30.7 05/29/2019 0346   MCHC 30.8 05/29/2019 0346   RDW 18.8 (H) 05/29/2019 0346   RDW 16.9 (H) 05/09/2018 0937   RDW 18.1 (H) 11/18/2016 1036   LYMPHSABS 1.2 05/25/2019 0910   LYMPHSABS 1.2 11/18/2016 1036   MONOABS 1.1 (H) 05/25/2019 0910   MONOABS 0.6 11/18/2016 1036   EOSABS 0.2 05/25/2019 0910   EOSABS 0.3 11/18/2016 1036   BASOSABS 0.0 05/25/2019 0910   BASOSABS 0.1 11/18/2016 1036    CMP     Component Value Date/Time   NA 139 05/29/2019 0346   NA 136 11/18/2016 1036  K 4.8 05/29/2019 0346   K 3.3 (L) 11/18/2016 1036   CL 100 05/29/2019 0346   CO2 20 (L) 05/29/2019 0346   CO2 29 11/18/2016 1036   GLUCOSE 104 (H) 05/29/2019 0346   GLUCOSE 95 11/18/2016 1036   BUN 132 (H) 05/29/2019 0346   BUN 25.7 11/18/2016 1036   CREATININE 9.67 (H) 05/29/2019 0346   CREATININE 10.64 (H) 11/01/2017 1008   CREATININE 6.6 (HH) 11/18/2016 1036   CALCIUM 8.7 (L) 05/29/2019 0346   CALCIUM 9.5 11/18/2016 1036   PROT 6.6 05/25/2019 0319   PROT 7.4 11/18/2016 1036   PROT 7.9 11/18/2016 1036   ALBUMIN 2.3 (L) 05/29/2019 0346   ALBUMIN 3.9 11/18/2016 1036   AST 25 05/25/2019 0319   AST 15 11/18/2016 1036   ALT 17 05/25/2019 0319   ALT <6 11/18/2016 1036   ALKPHOS 71 05/25/2019 0319   ALKPHOS 68 11/18/2016 1036   BILITOT 0.4 05/25/2019 0319   BILITOT 0.69 11/18/2016 1036   GFRNONAA 5 (L) 05/29/2019 0346   GFRNONAA 3 (L) 02/10/2017 1619   GFRAA 6 (L) 05/29/2019 0346   GFRAA <4 (L) 02/10/2017 1619   Imaging I have reviewed images in epic and the results pertinent to this consultation are: MRI examination of the brain on 05/23/2019 revealed  multifocal acute ischemic infarcts involving the bilateral cerebral and cerebellar hemispheres concerning for a central thromboembolic etiology.  Petechial blood in 1 of these infarcts of the right parietal cortex.  No frank hemorrhagic transformation.  Underlying atrophy with moderate chronic microvascular ischemic disease also seen.  Assessment:  63 year old man status post cardiac arrest with concern for hypoxic/anoxic brain injury as well as MRI evidenced multifocal strokes in the setting of a cardiac arrest, likely secondary to multifocal emboli from the cardiac arrest, with continued encephalopathy.  His exam has been worse than what would be expected based on the MR imaging, EEG was pursued which showed generalized periodic epileptiform discharges-GPD's due to anoxic injury but other factors such as cefepime toxicity were also considered.  Burst suppression was attempted but lightening sedation promptly returned the GPD's pattern and further but suppression was not pursued.  At this time, it is likely that he had some anoxic injury in addition to the strokes and the EEG pattern is a combination of those as well as cephalosporin toxicity and he would show improvement once he is able to metabolize the cefepime.  Unfortunately his microbiology susceptibilities are such that a cephalosporin is indispensable, but cefepime had been changed to ceftazidime and he has completed cephalosporin treatment now.  I would agree with the prior neurology assessment with Dr. Leonel Ramsay of giving him time and give a few days to assess for any improvement.  We will also consider spot EEG at some point in the next few days.  Impression: Toxic metabolic encephalopathy versus seizures versus cephalosporin induced encephalopathy.  Recommendations: Continue Vimpat 150 twice daily Continue Keppra 1 g daily with additional 500 after dialysis Consider EEG on Wednesday We will follow him with you after the EEG on  Wednesday. Supportive care and management per primary team as you are. Plan discussed with Dr. Carlis Abbott in the unit.  -- Amie Portland, MD [facility] Pager: [phone] If 7pm to 7am, please call on call as listed on AMION.

## 2019-05-29 NOTE — Progress Notes (Signed)
NAME:  Jeffrey [name], MRN:  [id number], DOB:  [birth year], LOS: 44 ADMISSION DATE:  04/27/2019, CONSULTATION DATE:  8/27  REFERRING MD: 8/27, CHIEF COMPLAINT: Cardiac arrest  Brief History   63 year old M admitted to [facility] on 8/27 status post V. fib arrest with an estimated downtime of 35 minutes before ROSC.  Completed TTM protocol.  Neuro prognostication pending after antibiotics have had time to washout.  Past Medical History  HIV Diffuse nonobstructive CAD Hepatitis B ESRD on home HD Hypothyroidism Peripheral vascular disease Prior GI bleed Aortic valve insufficiency s/p aortic valve endocarditis requiring replacement in 2019 Left VATS for fibrothorax following bacteremia in 10/2018  Significant Hospital Events   8/27  Admit with VF arrest, ~35 minute downtime 8/28  Re-warming, pressor dependent, EEG neg for seizure, amio stopped d/t brady, HD  8/29  Off Levophed, rewarmed 0330, off sedation  8/30  Heparin gtt initiated with EKG changes concerning for ischemia  8/31  Heparin gtt stopped  9/02  CVVHD stopped    Consults:  Cardiology Neurology Nephrology  Procedures:  ETT 8/27 >>  L IJ CVL 8/27 >>  L Femoral HD 8/28 >> remove on 9/7  Significant Diagnostic Tests:  ECHO 8/27 >> LVEF 45 to 50% with left ventricular wall dyskinesis, and AF at time of exam EEG 8/27 >> negative for seizure LHC 8/28 >> mid LAD lesion 100% stenosis, drug-eluting stent placed, distal apical LAD lesion stenosis 80% treated with PTCA, nonobstructive disease involving the circumflex and RCA EEG 8/30 >> frequent sharply contoured generalized discharges with triphasic morphology.  Irritability versus cefepime neurotoxicity CT Head 8/31 >> small vessel white matter disease MRI 9/1 >> patchy multifocal acute ischemic infarcts involving the bilateral cerebral and cerebellar hemispheres-suspicious for central thromboembolic etiology.  Petechial hemorrhage about 1 of these infarcts of the right parietal  cortex.  Underlying atrophy with chronic microvascular disease EEG 9/3 >> improved triphasic waves following initiation of antiseizure medications EEG 9/4 >> return of triphasic waves with propofol wean  Micro Data:  COVID 8/27 >> negative  Tracheal aspirate 8/28 >> moderate pseudomonas aeruginosa >> S-ceftaz           Serratia marcescens >> R- cefazolin, S-ceftaz Antimicrobials:  Cefepime 8/29 >> 9/2 Ceftazidime 9/2 >> 9/5  Interim history/subjective:  Febrile to 100.6 overnight.  Remains off sedation. HD this morning.  Objective   Blood pressure (!) 141/85, pulse 85, temperature 99.5 F (37.5 C), temperature source Axillary, resp. rate 19, height 6' (1.829 m), weight 88.3 kg, SpO2 100 %.    Vent Mode: PRVC FiO2 (%):  [21 %] 21 % Set Rate:  [14 bmp] 14 bmp Vt Set:  [620 mL] 620 mL PEEP:  [5 cmH20] 5 cmH20 Pressure Support:  [14 cmH20] 14 cmH20 Plateau Pressure:  [16 cmH20-18 cmH20] 18 cmH20   Intake/Output Summary (Last 24 hours) at 05/29/2019 0812 Last data filed at 05/29/2019 0600 Gross per 24 hour  Intake 1040 ml  Output 0 ml  Net 1040 ml   Filed Weights   05/28/19 0500 05/29/19 0358 05/29/19 0735  Weight: 89.3 kg 87.9 kg 88.3 kg    Examination: General: Chronically ill-appearing man lying in bed in no acute distress, intubated but not sedated. HENT: Normocephalic, atraumatic, large protruding tongue unchanged.  Mucous membranes moist. Eyes: Anicteric, no scleral edema. Neuro: Eyes not spontaneously open.  No response to verbal stimulation.  Grimace and withdraw from trapezius squeeze, but no withdrawal in any extremities with pinching in the proximal muscle bed  or nailbed pressure.  Positive gag, cough, corneal, pupillary reflexes. CV: Regular rate and rhythm, systolic murmur at the apex. PULM: Coughing frequently when stimulated, but otherwise breathing comfortably on the vent.  Clear to auscultation bilaterally.  Doing well on 21% FiO2, saturating 100%. GI: Soft,  nontender, nondistended.  Hypoactive bowel sounds. Extremities: Warm, dry, no pitting edema.  Skin: No rashes or wounds.  Resolved Hospital Problem list   Shock - initial component cardiogenic +/- distributive with PNA  Assessment & Plan:   VF Arrest, out of hospital due to ACS -approx 35 minute downtime before ROSC -post TTM  P: -Continue supportive ICU care.  Acetaminophen and cooling blankets for fevers.  Maintain euglycemia normocapnia. -Family understands the indication for prolonged neuro prognostication given the possibility of a negative response to antibiotics.  Acute Encephalopathy  -concern for anoxic vs metabolic insult Suspected Anoxic Brain Injury + Multifocal Strokes  -in setting of cardiac arrest P: -Appreciate neurology's input.  Discussed his case at bedside today.  Will neuro-prognosticate later this week.   -Continue supportive care, delirium precautions, and avoiding potentially sedating medications. -Tinea Keppra, valproate, Vimpat  Severe CAD  -s/p DES to Mid LAD and PTCA to distal LAD lesion 8/27  PAF  -did not tolerated amiodarone d/t bradycardia   P: -Continue dual antiplatelet therapy and statin.  -Increase metoprolol to 25 mg twice daily -Depending on neurologic recovery, may eventually require cardiac intervention  Acute Hypoxic Respiratory Failure  -in setting of cardiac arrest, pseudomonal / serratia PNA -cefepime stopped due to EEG results Pseudomonal / Serratia PNA  P: -Continue supportive ventilation  -Continue to monitor for recurrence of pneumonia.  Currently secretions appear unchanged.  Concerned that he had a fever overnight, but his white count is stable. -VAP prevention protocol  ESRD with hyperphosphatemia -on HD at home  P: -HD this morning -Appreciate nephrology's assistance -Remove dialysis catheter today.   Anemia due to ESRD and chronic disease -chronic disease + superimposed acute illness  -1 unit PRBC 8/31 P:  -Continue to monitor -Transfuse for hemoglobin < 7 unless active bleeding with hemodynamic instability or hemoglobin < 8 if active cardiac ischemia.   HIV  -on Biktarvy  Hepatitis B P: -Continue adjusted PTA regimen  Hyperglycemia, resolved.  Not requiring insulin recently.  A1c 4.7 on 05/24/2019. P: -Accu-Cheks twice daily with sliding scale insulin if required  At Risk Malnutrition  P: -Continue tube feeds  Best practice:  Diet: TF Pain/Anxiety/Delirium protocol (if indicated): Minimize sedating medications for neuro exam VAP protocol (if indicated): In place  DVT prophylaxis: SQ Heparin  GI prophylaxis: PPI  Glucose control: SSI PRN Mobility: BR  Code Status: Full Code  Family Communication: Brother and sister are decision-makers.  Updated sister at bedside on 9/6. Disposition: ICU   Labs   CBC: Recent Labs  Lab 05/25/19 0910  05/26/19 0440 05/27/19 0416 05/28/19 0419 05/28/19 0656 05/29/19 0346  WBC 14.7*  --  13.7* 11.0* 9.7  --  10.2  NEUTROABS 11.9*  --   --   --   --   --   --   HGB 6.7*   < > 7.5* 7.3* 6.9* 7.3* 7.0*  HCT 21.9*   < > 24.4* 24.0* 22.6* 24.0* 22.7*  MCV 99.5  --  99.2 99.6 98.7  --  99.6  PLT 181  --  205 183 207  --  228   < > = values in this interval not displayed.    Basic Metabolic Panel: Recent  Labs  Lab 05/25/19 0319 05/26/19 0440 05/27/19 0416 05/28/19 0419 05/29/19 0346  NA 136 136 137 138 139  K 4.2 4.0 4.3 4.7 4.8  CL 100 101 101 101 100  CO2 23 21* 20* 20* 20*  GLUCOSE 120* 109* 95 96 104*  BUN 35* 64* 66* 109* 132*  CREATININE 3.18* 5.63* 5.60* 7.87* 9.67*  CALCIUM 8.4* 8.7* 8.3* 8.7* 8.7*  MG 2.3 2.2 2.2 2.1 2.2  PHOS 2.6 3.9 4.0 5.1* 5.2*   GFR: Estimated Creatinine Clearance: 8.4 mL/min (A) (by C-G formula based on SCr of 9.67 mg/dL (H)). Recent Labs  Lab 05/22/19 1347 05/22/19 1722  05/26/19 0440 05/27/19 0416 05/28/19 0419 05/29/19 0346  WBC  --   --    < > 13.7* 11.0* 9.7 10.2  LATICACIDVEN 1.2 0.9   --   --   --   --   --    < > = values in this interval not displayed.    Liver Function Tests: Recent Labs  Lab 05/25/19 0319 05/26/19 0440 05/27/19 0416 05/28/19 0419 05/29/19 0346  AST 25  --   --   --   --   ALT 17  --   --   --   --   ALKPHOS 71  --   --   --   --   BILITOT 0.4  --   --   --   --   PROT 6.6  --   --   --   --   ALBUMIN 2.6* 2.3* 2.4* 2.3* 2.3*   No results for input(s): LIPASE, AMYLASE in the last 168 hours. No results for input(s): AMMONIA in the last 168 hours.  ABG    Component Value Date/Time   PHART 7.478 (H) 05/21/2019 0507   PCO2ART 37.0 05/21/2019 0507   PO2ART 108.0 05/21/2019 0507   HCO3 27.5 05/21/2019 0507   TCO2 29 05/21/2019 0507   ACIDBASEDEF 3.0 (H) 05/20/2019 0456   O2SAT 99.0 05/21/2019 0507     Coagulation Profile: No results for input(s): INR, PROTIME in the last 168 hours.  Cardiac Enzymes: No results for input(s): CKTOTAL, CKMB, CKMBINDEX, TROPONINI in the last 168 hours.  HbA1C: Hgb A1c MFr Bld  Date/Time Value Ref Range Status  05/24/2019 04:00 AM 4.7 (L) 4.8 - 5.6 % Final    Comment:    (NOTE) Pre diabetes:          5.7%-6.4% Diabetes:              >6.4% Glycemic control for   <7.0% adults with diabetes   07/26/2018 02:59 PM 4.2 (L) 4.8 - 5.6 % Final    Comment:    (NOTE) Pre diabetes:          5.7%-6.4% Diabetes:              >6.4% Glycemic control for   <7.0% adults with diabetes     CBG: Recent Labs  Lab 05/28/19 1518 05/28/19 2016 05/28/19 2352 05/29/19 0348 05/29/19 0746  GLUCAP 95 90 94 90 96     This patient is critically ill with multiple organ system failure which requires frequent high complexity decision making, assessment, support, evaluation, and titration of therapies. This was completed through the application of advanced monitoring technologies and extensive interpretation of multiple databases. During this encounter critical care time was devoted to patient care services described  in this note for 35 minutes.  Julian Hy, DO 05/29/19 8:49 AM [facility]

## 2019-05-29 NOTE — Procedures (Signed)
Patient seen on Hemodialysis. BP 127/85   Pulse 99   Temp 99.6 F (37.6 C) (Oral)   Resp 19   Ht 6' (1.829 m)   Wt 88.3 kg   SpO2 99%   BMI 26.40 kg/m   QB 400, UF goal 2.7L Tolerating treatment without problems at this time.   Elmarie Shiley MD [facility]. Office # [phone] Pager # [phone] 8:45 AM

## 2019-05-29 NOTE — Progress Notes (Signed)
PT Cancellation Note  Patient Details Name: Jeffrey [name] MRN: [id number] DOB: [birth year]   Cancelled Treatment:    Reason Eval/Treat Not Completed: Patient at procedure or test/unavailable(receiving HD)   Sandy Salaam Walid Haig 05/29/2019, 9:34 AM Elwyn Reach, PT Acute [facility] Services Pager: [phone] Office: [phone]

## 2019-05-30 LAB — RENAL FUNCTION PANEL
Albumin: 2.3 g/dL — ABNORMAL LOW (ref 3.5–5.0)
Anion gap: 15 (ref 5–15)
BUN: 85 mg/dL — ABNORMAL HIGH (ref 8–23)
CO2: 25 mmol/L (ref 22–32)
Calcium: 8.7 mg/dL — ABNORMAL LOW (ref 8.9–10.3)
Chloride: 96 mmol/L — ABNORMAL LOW (ref 98–111)
Creatinine, Ser: 6.41 mg/dL — ABNORMAL HIGH (ref 0.61–1.24)
GFR calc Af Amer: 10 mL/min — ABNORMAL LOW (ref >60)
GFR calc non Af Amer: 8 mL/min — ABNORMAL LOW (ref >60)
Glucose, Bld: 105 mg/dL — ABNORMAL HIGH (ref 70–99)
Phosphorus: 4.2 mg/dL (ref 2.5–4.6)
Potassium: 4 mmol/L (ref 3.5–5.1)
Sodium: 136 mmol/L (ref 135–145)

## 2019-05-30 LAB — CBC
HCT: 24 % — ABNORMAL LOW (ref 39.0–52.0)
Hemoglobin: 7.4 g/dL — ABNORMAL LOW (ref 13.0–17.0)
MCH: 30 pg (ref 26.0–34.0)
MCHC: 30.8 g/dL (ref 30.0–36.0)
MCV: 97.2 fL (ref 80.0–100.0)
Platelets: 292 10*3/uL (ref 150–400)
RBC: 2.47 MIL/uL — ABNORMAL LOW (ref 4.22–5.81)
RDW: 18.3 % — ABNORMAL HIGH (ref 11.5–15.5)
WBC: 12.1 10*3/uL — ABNORMAL HIGH (ref 4.0–10.5)
nRBC: 0 % (ref 0.0–0.2)

## 2019-05-30 LAB — MAGNESIUM: Magnesium: 1.9 mg/dL (ref 1.7–2.4)

## 2019-05-30 LAB — GLUCOSE, CAPILLARY
Glucose-Capillary: 100 mg/dL — ABNORMAL HIGH (ref 70–99)
Glucose-Capillary: 113 mg/dL — ABNORMAL HIGH (ref 70–99)

## 2019-05-30 NOTE — Progress Notes (Signed)
SLP Cancellation Note  Patient Details Name: Jeffrey [name] MRN: [id number] DOB: [birth year]   Cancelled treatment:       Reason Eval/Treat Not Completed: Patient not medically ready - remains on the vent. Will continue to follow.   Jeffrey [name] Jeffrey [name] 05/30/2019, 7:17 AM  Jeffrey [name], M.A. [place] Acute Environmental education officer [phone] Office [phone]

## 2019-05-30 NOTE — Progress Notes (Signed)
Occupational Therapy Treatment Patient Details Name: ANIS DEGIDIO MRN: [id number] DOB: 1956/04/29 Today's Date: 05/30/2019    History of present illness Pt is a 63 y/o male with end-stage renal disease on home dialysis, aortic valve endocarditis (valve replacement 2019), HIV, hepatitis B, PVD, CAD, admitted 8/27 status post VF arrest estimated time to ROSC 35 minutes. 8/29 L heart cath. 8/29 EEG negative for seizure, reports sevre diffuse encephalopathy, on CRRT.  MRI 8/31 reveals patchy mild to focal ischemic infarcts with petechial hemorrhage, underlying atrophy with chronic microvascular ischemic disease.   OT comments  Patient supine in bed, engaged in OT/PT cotx.  Patient with reflexive response to noxious nail bed stimuli in all 4 extremities, flexion withdrawal to L UE.  Eyes closed throughout session, with inconsistent blinking to threat.  +3 to reposition and  Attempted chair position with poor tolerance to upright position (as coming into chair position noted increased RR and HR, returned to comfortable position and RN suctioned pt).  Does not follow commands.  Will continue to follow, asked RN to ask family to bring in familiar items/ask about music he likes to attempt to increase arousal/engagement.    Follow Up Recommendations  SNF;Supervision/Assistance - 24 hour    Equipment Recommendations  Other (comment)(TBD)    Recommendations for Other Services Other (comment)(palliative for goals of care)    Precautions / Restrictions Precautions Precautions: Other (comment) Precaution Comments: L femoral HD line, ETT Restrictions Weight Bearing Restrictions: No       Mobility Bed Mobility               General bed mobility comments: total assist +3 to reposition and scoot up in bed, attempt into chair position but poor tolerance to upright position causing increased RR and HR (RN suctioned)  Transfers                 General transfer comment: NT    Balance  Overall balance assessment: (NT)                                         ADL either performed or assessed with clinical judgement   ADL Overall ADL's : Needs assistance/impaired                                       General ADL Comments: requires total assist for all ADLs at this time, bed level only      Vision   Vision Assessment?: Vision impaired- to be further tested in functional context Additional Comments: R gaze, requires assist to open eyelids today; does not open to response but blinks to threat intermittently    Perception     Praxis      Cognition Arousal/Alertness: Lethargic Behavior During Therapy: Flat affect Overall Cognitive Status: Difficult to assess                                 General Comments: pt with eyes cloed throughout session, not following commands         Exercises Exercises: Other exercises Other Exercises Other Exercises: B UE PROM in all planes    Shoulder Instructions       General Comments      Pertinent Vitals/ Pain  Pain Assessment: Faces Faces Pain Scale: No hurt  Home Living                                          Prior Functioning/Environment              Frequency  Min 2X/week        Progress Toward Goals  OT Goals(current goals can now be found in the care plan section)  Progress towards OT goals: Progressing toward goals  Acute Rehab OT Goals Patient Stated Goal: unable to state  Plan Discharge plan remains appropriate;Frequency remains appropriate    Co-evaluation    PT/OT/SLP Co-Evaluation/Treatment: Yes Reason for Co-Treatment: Complexity of the patient's impairments (multi-system involvement)   OT goals addressed during session: ADL's and self-care      AM-PAC OT "6 Clicks" Daily Activity     Outcome Measure   Help from another person eating meals?: Total Help from another person taking care of personal grooming?:  Total Help from another person toileting, which includes using toliet, bedpan, or urinal?: Total Help from another person bathing (including washing, rinsing, drying)?: Total Help from another person to put on and taking off regular upper body clothing?: Total Help from another person to put on and taking off regular lower body clothing?: Total 6 Click Score: 6    End of Session    OT Visit Diagnosis: Other abnormalities of gait and mobility (R26.89);Other symptoms and signs involving cognitive function;Other symptoms and signs involving the nervous system (R29.898)   Activity Tolerance No increased pain;Patient limited by lethargy   Patient Left in bed;with call bell/phone within reach;with bed alarm set;with nursing/sitter in room   Nurse Communication Mobility status;Precautions        Time: 6945-0388 OT Time Calculation (min): 34 min  Charges: OT General Charges $OT Visit: 1 Visit OT Treatments $Self Care/Home Management : 8-22 mins  Delight Stare, [place] Pager [phone] Office [phone]    Delight Stare 05/30/2019, 12:57 PM

## 2019-05-30 NOTE — Progress Notes (Signed)
Pt RR went up to 62 times a minute and pt's WOB was increased. RT switched pt back over to full support settings at 1521. Will continue to monitor.

## 2019-05-30 NOTE — Progress Notes (Addendum)
NAME:  Jeffrey [name], MRN:  [id number], DOB:  08-29-1956, LOS: 12 ADMISSION DATE:  04/28/2019, CONSULTATION DATE:  8/27  REFERRING MD: 8/27, CHIEF COMPLAINT: Cardiac arrest  Brief History   63 year old M admitted to [facility] on 8/27 status post V. fib arrest with an estimated downtime of 35 minutes before ROSC.  Completed TTM protocol.  Neuro prognostication pending after antibiotics have had time to washout.  Past Medical History  HIV Diffuse nonobstructive CAD Hepatitis B ESRD on home HD Hypothyroidism Peripheral vascular disease Prior GI bleed Aortic valve insufficiency s/p aortic valve endocarditis requiring replacement in 2019 Left VATS for fibrothorax following bacteremia in 10/2018  Significant Hospital Events   8/27  Admit with VF arrest, ~35 minute downtime 8/28  Re-warming, pressor dependent, EEG neg for seizure, amio stopped d/t brady, HD  8/29  Off Levophed, rewarmed 0330, off sedation  8/30  Heparin gtt initiated with EKG changes concerning for ischemia  8/31  Heparin gtt stopped  9/02  CVVHD stopped    Consults:  Cardiology Neurology Nephrology  Procedures:  ETT 8/27 >>  L IJ CVL 8/27 >>  L Femoral HD 8/28 >> remove on 9/7  Significant Diagnostic Tests:  ECHO 8/27 >> LVEF 45 to 50% with left ventricular wall dyskinesis, and AF at time of exam EEG 8/27 >> negative for seizure LHC 8/28 >> mid LAD lesion 100% stenosis, drug-eluting stent placed, distal apical LAD lesion stenosis 80% treated with PTCA, nonobstructive disease involving the circumflex and RCA EEG 8/30 >> frequent sharply contoured generalized discharges with triphasic morphology.  Irritability versus cefepime neurotoxicity CT Head 8/31 >> small vessel white matter disease MRI 9/1 >> patchy multifocal acute ischemic infarcts involving the bilateral cerebral and cerebellar hemispheres-suspicious for central thromboembolic etiology.  Petechial hemorrhage about 1 of these infarcts of the right parietal  cortex.  Underlying atrophy with chronic microvascular disease EEG 9/3 >> improved triphasic waves following initiation of antiseizure medications EEG 9/4 >> return of triphasic waves with propofol wean  Micro Data:  COVID 8/27 >> negative  Tracheal aspirate 8/28 >> moderate pseudomonas aeruginosa >> S-ceftaz           Serratia marcescens >> R- cefazolin, S-ceftaz Antimicrobials:  Cefepime 8/29 >> 9/2 Ceftazidime 9/2 >> 9/5  Interim history/subjective:  Febrile to 100.6 overnight.  Remains off sedation. HD this morning.  Objective   Blood pressure (!) 141/84, pulse 81, temperature 100.3 F (37.9 C), temperature source Oral, resp. rate (!) 25, height 6' (1.829 m), weight 88.1 kg, SpO2 100 %.    Vent Mode: PSV;CPAP FiO2 (%):  [21 %] 21 % Set Rate:  [14 bmp] 14 bmp Vt Set:  [630 mL] 630 mL PEEP:  [5 cmH20] 5 cmH20 Pressure Support:  [12 cmH20] 12 cmH20 Plateau Pressure:  [14 cmH20-24 cmH20] 14 cmH20   Intake/Output Summary (Last 24 hours) at 05/30/2019 0852 Last data filed at 05/30/2019 0730 Gross per 24 hour  Intake 920.67 ml  Output 1440 ml  Net -519.33 ml   Filed Weights   05/29/19 0735 05/29/19 1125 05/30/19 0439  Weight: 88.3 kg 96.4 kg 88.1 kg    Examination: General: Chronically ill-appearing man lying in bed in no acute distress, intubated but not sedated. HENT: Normocephalic, atraumatic, large protruding tongue unchanged.  Mucous membranes moist. Eyes: Anicteric, no scleral edema. Neuro: Eyes do not spontaneously open.  No response to verbal stimulation.  Grimace and withdraw of all four extremities to noxious stimuli--more sluggish on right. Positive gag, cough, corneal,  pupillary reflexes. CV: Regular rate and rhythm, systolic murmur at the apex. PULM:  Clear to auscultation bilaterally.  GI: Soft, nontender, nondistended.  Hypoactive bowel sounds. Extremities: Warm, dry, no pitting edema.  Skin: No rashes or wounds.  Resolved Hospital Problem list   Shock -  initial component cardiogenic +/- distributive with PNA Hyperglycemia, resolved.  Not requiring insulin recently.  A1c 4.7 on 05/24/2019. Pseudomonal/serratia pna-completed course of abx  Assessment & Plan:   VF Arrest, out of hospital due to ACS -approx 35 minute downtime before ROSC -post TTM  Severe CAD  -s/p DES to Mid LAD and PTCA to distal LAD lesion 8/27  PAF  -did not tolerated amiodarone d/t bradycardia   Plan: continue dual platelet therapy for at least 12 mon. On brilinta. Continue metoprolol 25mg  bid   Acute Encephalopathy Suspected Anoxic Brain Injury + Multifocal Strokes (on MRI) in setting of cardiac arrest Impression: toxic metabolic encephalopathy vs cephalosporin induced encephalopathy vs seizure. EEG patterns reveal combination of anoxic brain injury and cephalosporin neurotoxicity. Per discussion with neurology last week, it is my understanding that you would expect some improvement in mental status within a couple of days of stopping the offending drug however I am not appreciating any significant changes at this time. Plan -Appreciate neurology's input.  Will neuro-prognosticate later this week.   -Continue supportive care, delirium precautions, and avoiding potentially sedating medications. -Keppra, valproate, Vimpat -Possible EEG on Wednesday  Acute Hypoxic Respiratory Failure. Mental status barrier to extubation. FiO2 21%, PEEP 5 Plan -Continue to monitor for recurrence of pneumonia.  Currently secretions appear unchanged.  Does continue to have intermittent fevers. White count up 10-->12 today. Will re-evaluate with morning labs tomorrow. -VAP prevention protocol  ESRD with hyperphosphatemia.  Nephrology following. -on HD at home  -received CRRT 8/28-9/2. Now on intermittent dialysis Plan: MWF schedule  Anemia due to ESRD and chronic disease -chronic disease + superimposed acute illness  -1 unit PRBC 8/31 Plan -Continue to monitor -Transfuse for  hemoglobin < 7 unless active bleeding with hemodynamic instability or hemoglobin < 8 if active cardiac ischemia.   HIV. Plan: on Biktarvy  Hepatitis B. Plan: Continue adjusted PTA regimen  At Risk Malnutrition  P: -Continue tube feeds  Best practice:  Diet: TF Pain/Anxiety/Delirium protocol (if indicated): Minimize sedating medications for neuro exam VAP protocol (if indicated): In place  DVT prophylaxis: SQ Heparin  GI prophylaxis: PPI  Mobility: BR  Code Status: Full Code  Family Communication: Will attempt to contact family today to update and discuss future plans.  Disposition: ICU   Mitzi Hansen, MD Internal Medicine Resident PGY--1  05/30/19 8:52 AM   Attending Note:  I have examined patient, reviewed labs, studies and notes.   63 year old male with end-stage renal disease, CAD, HIV, history of AV endocarditis and replacement 2019.  Admitted with VF arrest, approximately 35 minutes hypoperfusion time.  Underwent TTM.  Remained encephalopathic post rewarming, MRI suspicious for acute ischemic infarcts, question thromboembolic versus global ischemia.  EEG is also showed triphasic discharges consistent with possible anoxic injury, cephalosporin neurotoxicity is also been considered.  He was on cefepime and then ceftaz edema for Pseudomonas and Serratia and tracheal aspirate, both have been stopped.  Antiepileptics were added and he underwent a period of burst suppression with propofol, has been off since 9/4.  His last hemodialysis was 9/7.  Vitals:   05/30/19 1131 05/30/19 1200 05/30/19 1300 05/30/19 1400  BP: 126/82 (!) 145/88 134/84 138/84  Pulse: 82 79 84 78  Resp: (!) 21 (!) 22 (!) 21 17  Temp:      TempSrc:      SpO2: 96% 97% 97% 97%  Weight:      Height:      Ill-appearing man, ventilated and tolerating pressure support currently.  Oropharynx significant for large tongue, ET tube in place.  He will withdraw to pain, more vigorously in the left upper extremity than  the right.  Does not open eyes.  He does have a cough, gag reflex.  Pupils are reactive.  Heart is regular, distant, 2/6 M.  Lungs clear to auscultation bilaterally.  Abdomen is obese, soft with positive bowel sounds.  Extremities have no significant edema.  Acute respiratory failure following cardiac arrest.  Largest barrier to extubation is airway protection and mental status.  Currently following off antibiotics  His encephalopathy could be multifactorial, consider cephalosporin induced, consider seizure activity or postictal state.  Most likely contribution of global ischemia, possible superimposed thromboembolic ischemia.  We will continue his current Keppra, valproate, Vimpat.  Repeat EEG planned for 9/9.  Appreciate neurology assistance with prognostication.  We will need to discuss with family, incorporate goals of care.  Continue hemodialysis as per nephrology plans Remains on [place].  Independent critical care time is 35 minutes.   Baltazar Apo, MD, PhD 05/30/2019, 2:34 PM [facility] [phone] or if no answer [phone]

## 2019-05-30 NOTE — Progress Notes (Signed)
Briefly examined.  Unchanged exam from yesterday EEG tomorrow. We will follow -- Amie Portland, MD [facility] Pager: [phone] If 7pm to 7am, please call on call as listed on AMION.

## 2019-05-30 NOTE — Plan of Care (Signed)
  Problem: Clinical Measurements: Goal: Ability to maintain clinical measurements within normal limits will improve Outcome: Progressing Goal: Will remain free from infection Outcome: Progressing Goal: Diagnostic test results will improve Outcome: Progressing Goal: Respiratory complications will improve Outcome: Progressing Goal: Cardiovascular complication will be avoided Outcome: Progressing   Problem: Nutrition: Goal: Adequate nutrition will be maintained Outcome: Progressing   Problem: Elimination: Goal: Will not experience complications related to bowel motility Outcome: Progressing   Problem: Pain Managment: Goal: General experience of comfort will improve Outcome: Progressing   Problem: Safety: Goal: Ability to remain free from injury will improve Outcome: Progressing   Problem: Skin Integrity: Goal: Risk for impaired skin integrity will decrease Outcome: Progressing   Problem: Activity: Goal: Ability to tolerate increased activity will improve Outcome: Progressing

## 2019-05-30 NOTE — Progress Notes (Signed)
Physical Therapy Treatment Patient Details Name: Jeffrey [name] MRN: [id number] DOB: [birth year] Today's Date: 05/30/2019    History of Present Illness Pt is a 63 y/o male with end-stage renal disease on home dialysis, aortic valve endocarditis (valve replacement 2019), HIV, hepatitis B, PVD, CAD, admitted 8/27 status post VF arrest estimated time to ROSC 35 minutes. 8/29 L heart cath. 8/29 EEG negative for seizure, reports sevre diffuse encephalopathy, on CRRT.  MRI 8/31 reveals patchy mild to focal ischemic infarcts with petechial hemorrhage, underlying atrophy with chronic microvascular ischemic disease.    PT Comments    Patient with limited responses to stimulation today but did demonstrate withdrawal on all 4 extremities with nail bed pressure.  No extensor tone noted in LE's today and positive for primitive reflex on L with ATNR.  Still intubated eyes closed almost all of session with RN reports of Fentanyl given overnight.  Patient remains about the same level of [place] coma scale.  Asked RN to relay to family to bring in items significant to patient to use for stimulation.  Will follow along.  Follow Up Recommendations  SNF     Equipment Recommendations       Recommendations for Other Services       Precautions / Restrictions Precautions Precautions: Other (comment) Precaution Comments: L femoral HD line, ETT Restrictions Weight Bearing Restrictions: No    Mobility  Bed Mobility Overal bed mobility: Needs Assistance             General bed mobility comments: total assist +3 to reposition and scoot up in bed, attempt into chair position but poor tolerance to upright position causing increased RR and HR (RN suctioned)  Transfers                 General transfer comment: NT  Ambulation/Gait                 Stairs             Wheelchair Mobility    Modified Rankin (Stroke Patients Only) Modified Rankin (Stroke Patients Only) Pre-Morbid Rankin  Score: Moderate disability Modified Rankin: Severe disability     Balance Overall balance assessment: (NT)                                          Cognition Arousal/Alertness: Lethargic Behavior During Therapy: Flat affect Overall Cognitive Status: Difficult to assess                                 General Comments: pt with eyes cloed throughout session, not following commands; seen for coma stim      Exercises Other Exercises Other Exercises: PROM bilat LE's stretch to heel cords Other Exercises: B UE PROM in all planes     General Comments        Pertinent Vitals/Pain Pain Assessment: Faces Faces Pain Scale: No hurt    Home Living                      Prior Function            PT Goals (current goals can now be found in the care plan section) Acute Rehab PT Goals Patient Stated Goal: unable to state Progress towards PT goals: Not progressing toward goals - comment(remains obtunded)  Frequency    Min 1X/week      PT Plan Current plan remains appropriate;Frequency needs to be updated    Co-evaluation PT/OT/SLP Co-Evaluation/Treatment: Yes Reason for Co-Treatment: Complexity of the patient's impairments (multi-system involvement) PT goals addressed during session: Other (comment)(coma stimulation) OT goals addressed during session: ADL's and self-care      AM-PAC PT "6 Clicks" Mobility   Outcome Measure  Help needed turning from your back to your side while in a flat bed without using bedrails?: Total Help needed moving from lying on your back to sitting on the side of a flat bed without using bedrails?: Total Help needed moving to and from a bed to a chair (including a wheelchair)?: Total Help needed standing up from a chair using your arms (e.g., wheelchair or bedside chair)?: Total Help needed to walk in hospital room?: Total Help needed climbing 3-5 steps with a railing? : Total 6 Click Score: 6     End of Session   Activity Tolerance: Other (comment)(increased RR, HR on vent) Patient left: in bed   PT Visit Diagnosis: Other symptoms and signs involving the nervous system (R29.898);Other abnormalities of gait and mobility (R26.89)     Time: 7998-7215 PT Time Calculation (min) (ACUTE ONLY): 34 min  Charges:  $Therapeutic Activity: 8-22 mins                     Magda Kiel, [place] [facility] [phone] 05/30/2019    Reginia Naas 05/30/2019, 1:47 PM

## 2019-05-30 NOTE — Progress Notes (Signed)
Patient ID: Jeffrey [name], male   DOB: 19-Oct-1955, 63 y.o.   MRN: [id number]  [facility] Progress Note   Assessment/ Plan:   1.  Acute hypoxic respiratory failure status post V. fib cardiac arrest: Secondary to LAD STEMI now status post DES to LAD with a background history of bioprosthetic aortic valve replacement.  Remains ventilator dependent without any ability to wean due to encephalopathy. 2. ESRD: Earlier on CRRT due to hemodynamic instability from 8/28-9/2.  He has tolerated intermittent hemodialysis well since then and will continue hemodialysis on a Monday/Wednesday/Friday schedule via left RCF.  May be able to discontinue his femoral temporary dialysis catheter if he tolerates tomorrow's dialysis treatment without problem/need for CRRT. 3. Anemia: Overnight stable hemoglobin/hematocrit without overt loss, monitor with serial labs. 4. CKD-MBD: Mild hyperphosphatemia with NPO status.  We will continue to follow with hemodialysis. 5. Nutrition: Continue enteral nutritional support per CCM. 6.  Encephalopathy: Metabolic and likely secondary to anoxic injury from V. fib arrest versus cefepime induced per neurology evaluation; awaiting reevaluation.  Prognosis guarded at this time.  Subjective:   No acute events noted from overnight, tolerated hemodialysis without problems yesterday.   Objective:   BP 101/75   Pulse 82   Temp 100.3 F (37.9 C) (Oral)   Resp 14   Ht 6' (1.829 m)   Wt 88.1 kg   SpO2 96%   BMI 26.34 kg/m   Physical Exam: Gen: Somnolent, unresponsive, intermittent mouth movements on ventilator CVS: Pulse regular rhythm, normal rate, S1 and S2 normal Resp: Anteriorly clear to auscultation, no distinct rales or rhonchi Abd: Soft, obese, nontender Ext: Trace edema over lower extremities.  Left RCF with intact dressings.  Labs: BMET Recent Labs  Lab 05/24/19 0400 05/25/19 0319 05/26/19 0440 05/27/19 0416 05/28/19 0419 05/29/19 0346 05/30/19 0432   NA 137 136 136 137 138 139 136  K 4.0 4.2 4.0 4.3 4.7 4.8 4.0  CL 102 100 101 101 101 100 96*  CO2 22 23 21* 20* 20* 20* 25  GLUCOSE 108* 120* 109* 95 96 104* 105*  BUN 19 35* 64* 66* 109* 132* 85*  CREATININE 1.88* 3.18* 5.63* 5.60* 7.87* 9.67* 6.41*  CALCIUM 8.4* 8.4* 8.7* 8.3* 8.7* 8.7* 8.7*  PHOS 1.6* 2.6 3.9 4.0 5.1* 5.2* 4.2   CBC Recent Labs  Lab 05/25/19 0910  05/27/19 0416 05/28/19 0419 05/28/19 0656 05/29/19 0346 05/30/19 0432  WBC 14.7*   < > 11.0* 9.7  --  10.2 12.1*  NEUTROABS 11.9*  --   --   --   --   --   --   HGB 6.7*   < > 7.3* 6.9* 7.3* 7.0* 7.4*  HCT 21.9*   < > 24.0* 22.6* 24.0* 22.7* 24.0*  MCV 99.5   < > 99.6 98.7  --  99.6 97.2  PLT 181   < > 183 207  --  228 292   < > = values in this interval not displayed.   Medications:    . aspirin  81 mg Per Tube Daily  . atorvastatin  40 mg Per Tube q1800  . B-complex with vitamin C  1 tablet Per Tube Daily  . chlorhexidine gluconate (MEDLINE KIT)  15 mL Mouth Rinse BID  . Chlorhexidine Gluconate Cloth  6 each Topical Q0600  . darbepoetin (ARANESP) injection - DIALYSIS  60 mcg Intravenous Q Mon-HD  . emtricitabine-tenofovir AF  1 tablet Per Tube Daily   And  . dolutegravir  50 mg Oral Daily  . feeding supplement (PRO-STAT SUGAR FREE 64)  60 mL Per Tube TID  . feeding supplement (VITAL AF 1.2 CAL)  1,000 mL Per Tube Q24H  . heparin injection (subcutaneous)  5,000 Units Subcutaneous Q8H  . insulin aspart  1-3 Units Subcutaneous BID WC  . mouth rinse  15 mL Mouth Rinse 10 times per day  . metoprolol tartrate  25 mg Per Tube BID  . pantoprazole sodium  40 mg Per Tube QHS  . ticagrelor  90 mg Per Tube BID   Elmarie Shiley, MD 05/30/2019, 8:02 AM

## 2019-05-31 ENCOUNTER — Inpatient Hospital Stay (HOSPITAL_COMMUNITY): Payer: Medicare Other

## 2019-05-31 LAB — CBC WITH DIFFERENTIAL/PLATELET
Abs Immature Granulocytes: 0.15 10*3/uL — ABNORMAL HIGH (ref 0.00–0.07)
Basophils Absolute: 0 10*3/uL (ref 0.0–0.1)
Basophils Relative: 0 %
Eosinophils Absolute: 0.1 10*3/uL (ref 0.0–0.5)
Eosinophils Relative: 1 %
HCT: 22.8 % — ABNORMAL LOW (ref 39.0–52.0)
Hemoglobin: 7 g/dL — ABNORMAL LOW (ref 13.0–17.0)
Immature Granulocytes: 1 %
Lymphocytes Relative: 8 %
Lymphs Abs: 0.9 10*3/uL (ref 0.7–4.0)
MCH: 30.2 pg (ref 26.0–34.0)
MCHC: 30.7 g/dL (ref 30.0–36.0)
MCV: 98.3 fL (ref 80.0–100.0)
Monocytes Absolute: 0.7 10*3/uL (ref 0.1–1.0)
Monocytes Relative: 6 %
Neutro Abs: 9.4 10*3/uL — ABNORMAL HIGH (ref 1.7–7.7)
Neutrophils Relative %: 84 %
Platelets: 326 10*3/uL (ref 150–400)
RBC: 2.32 MIL/uL — ABNORMAL LOW (ref 4.22–5.81)
RDW: 17.7 % — ABNORMAL HIGH (ref 11.5–15.5)
WBC: 11.3 10*3/uL — ABNORMAL HIGH (ref 4.0–10.5)
nRBC: 0 % (ref 0.0–0.2)

## 2019-05-31 LAB — RENAL FUNCTION PANEL
Albumin: 2.3 g/dL — ABNORMAL LOW (ref 3.5–5.0)
Anion gap: 17 — ABNORMAL HIGH (ref 5–15)
BUN: 114 mg/dL — ABNORMAL HIGH (ref 8–23)
CO2: 23 mmol/L (ref 22–32)
Calcium: 8.8 mg/dL — ABNORMAL LOW (ref 8.9–10.3)
Chloride: 96 mmol/L — ABNORMAL LOW (ref 98–111)
Creatinine, Ser: 8.38 mg/dL — ABNORMAL HIGH (ref 0.61–1.24)
GFR calc Af Amer: 7 mL/min — ABNORMAL LOW (ref >60)
GFR calc non Af Amer: 6 mL/min — ABNORMAL LOW (ref >60)
Glucose, Bld: 109 mg/dL — ABNORMAL HIGH (ref 70–99)
Phosphorus: 4.5 mg/dL (ref 2.5–4.6)
Potassium: 3.8 mmol/L (ref 3.5–5.1)
Sodium: 136 mmol/L (ref 135–145)

## 2019-05-31 LAB — MAGNESIUM: Magnesium: 2 mg/dL (ref 1.7–2.4)

## 2019-05-31 LAB — GLUCOSE, CAPILLARY
Glucose-Capillary: 105 mg/dL — ABNORMAL HIGH (ref 70–99)
Glucose-Capillary: 98 mg/dL (ref 70–99)
Glucose-Capillary: 99 mg/dL (ref 70–99)

## 2019-05-31 MED ORDER — SODIUM CHLORIDE 0.9 % IV SOLN
INTRAVENOUS | Status: DC | PRN
  Administered 2019-06-01: 10 mL/h via INTRAVENOUS

## 2019-05-31 MED ORDER — WHITE PETROLATUM EX OINT
TOPICAL_OINTMENT | Freq: Two times a day (BID) | CUTANEOUS | Status: DC
  Administered 2019-05-31 – 2019-06-02 (×5): 0.2 via TOPICAL
  Filled 2019-05-31 (×3): qty 28.35

## 2019-05-31 MED ORDER — VITAL AF 1.2 CAL PO LIQD
1000.0000 mL | ORAL | Status: DC
  Administered 2019-05-31 – 2019-06-01 (×3): 1000 mL

## 2019-05-31 MED ORDER — PRO-STAT SUGAR FREE PO LIQD
30.0000 mL | Freq: Three times a day (TID) | ORAL | Status: DC
  Administered 2019-05-31 – 2019-06-02 (×5): 30 mL
  Filled 2019-05-31 (×5): qty 30

## 2019-05-31 NOTE — Progress Notes (Signed)
NAME:  Jeffrey [name], MRN:  [id number], DOB:  12/09/1955, LOS: 81 ADMISSION DATE:  05/20/2019, CONSULTATION DATE:  8/27  REFERRING MD: 8/27, CHIEF COMPLAINT: Cardiac arrest  Brief History   63 year old M admitted to [facility] on 8/27 status post V. fib arrest with an estimated downtime of 35 minutes before ROSC.  Completed TTM protocol.  Neuro prognostication pending after antibiotics have had time to washout.  Past Medical History  HIV Diffuse nonobstructive CAD Hepatitis B ESRD on home HD Hypothyroidism Peripheral vascular disease Prior GI bleed Aortic valve insufficiency s/p aortic valve endocarditis requiring replacement in 2019 Left VATS for fibrothorax following bacteremia in 10/2018  Significant Hospital Events   8/27  Admit with VF arrest, ~35 minute downtime 8/28  Re-warming, pressor dependent, EEG neg for seizure, amio stopped d/t brady, HD  8/29  Off Levophed, rewarmed 0330, off sedation  8/30  Heparin gtt initiated with EKG changes concerning for ischemia  8/31  Heparin gtt stopped  9/02  CVVHD stopped    Consults:  Cardiology Neurology Nephrology  Procedures:  ETT 8/27 >>  L IJ CVL 8/27 >>  L Femoral HD 8/28 >> remove on 9/7  Significant Diagnostic Tests:  ECHO 8/27 >> LVEF 45 to 50% with left ventricular wall dyskinesis, and AF at time of exam EEG 8/27 >> negative for seizure LHC 8/28 >> mid LAD lesion 100% stenosis, drug-eluting stent placed, distal apical LAD lesion stenosis 80% treated with PTCA, nonobstructive disease involving the circumflex and RCA EEG 8/30 >> frequent sharply contoured generalized discharges with triphasic morphology.  Irritability versus cefepime neurotoxicity CT Head 8/31 >> small vessel white matter disease MRI 9/1 >> patchy multifocal acute ischemic infarcts involving the bilateral cerebral and cerebellar hemispheres-suspicious for central thromboembolic etiology.  Petechial hemorrhage about 1 of these infarcts of the right parietal  cortex.  Underlying atrophy with chronic microvascular disease EEG 9/3 >> improved triphasic waves following initiation of antiseizure medications EEG 9/4 >> return of triphasic waves with propofol wean EEG 9/9 >> pending  Micro Data:  COVID 8/27 >> negative  Tracheal aspirate 8/28 >> moderate pseudomonas aeruginosa >> S-ceftaz           Serratia marcescens >> R- cefazolin, S-ceftaz Antimicrobials:  Cefepime 8/29 >> 9/2 Ceftazidime 9/2 >> 9/5  Interim history/subjective:  No significant changes overnight. Was noted to become tachypnic  After being placed on pressure support yesterday.  Objective   Blood pressure 134/76, pulse 87, temperature 98.6 F (37 C), temperature source Oral, resp. rate 16, height 6' (1.829 m), weight 87.5 kg, SpO2 95 %.    Vent Mode: PRVC FiO2 (%):  [21 %] 21 % Set Rate:  [14 bmp] 14 bmp Vt Set:  [620 mL-630 mL] 620 mL PEEP:  [5 cmH20] 5 cmH20 Pressure Support:  [12 cmH20] 12 cmH20 Plateau Pressure:  [17 cmH20-24 cmH20] 18 cmH20   Intake/Output Summary (Last 24 hours) at 05/31/2019 0647 Last data filed at 05/31/2019 0333 Gross per 24 hour  Intake 1396.5 ml  Output -  Net 1396.5 ml   Filed Weights   05/29/19 1125 05/30/19 0439 05/31/19 0317  Weight: 96.4 kg 88.1 kg 87.5 kg    Examination: General: Chronically ill-appearing man lying in bed in no acute distress, intubated but not sedated. HENT: Normocephalic, atraumatic, large protruding tongue unchanged.  Mucous membranes moist. Eyes: Anicteric, no scleral icteris. Neuro: Eyes do not spontaneously open.  No response to verbal stimulation.  Grimace and withdraw of RUE to noxious stimuli. Unable  to illicit response on remaining extremities. Positive gag, cough, corneal, pupillary reflexes. Downward gaze.  CV: Regular rate and rhythm, systolic murmur at the apex. PULM:  Clear to auscultation bilaterally.  GI: Soft, nontender, nondistended.  Hypoactive bowel sounds. Extremities: Warm. Trace edema to  bilateral lower extremities Skin: No rashes or wounds.  Resolved Hospital Problem list   Shock - initial component cardiogenic +/- distributive with PNA Hyperglycemia, resolved.  Not requiring insulin recently.  A1c 4.7 on 05/24/2019. Pseudomonal/serratia pna-completed course of abx  Assessment & Plan:   VF Arrest, out of hospital due to ACS -approx 35 minute downtime before ROSC -post TTM  Severe CAD  -s/p DES to Mid LAD and PTCA to distal LAD lesion 8/27  PAF  -did not tolerated amiodarone d/t bradycardia   Plan: continue dual platelet therapy for at least 12 mon. On brilinta. Continue metoprolol 25mg  bid   Acute Encephalopathy Suspected Anoxic Brain Injury + Multifocal Strokes (on MRI) in setting of cardiac arrest Impression: toxic metabolic encephalopathy vs cephalosporin induced encephalopathy vs seizure.  Overall, given lack of improvement, I am skeptical of Jeffrey [name]'s prognosis. Given the extended period of time, his chance of returning to baseline or having a decent/meaningful quality of life is likely diminishing. I know there have been past discussion with family in regards to goals of care at which time, my impression from chart review, is that they stated he would not want trach/peg. Further time for neuro-prognostication was needed at the time of that discussion due to questionable cephalosporin neurotoxicity. Now that he has remained off cephalosporins for the past few days, I would expect there to be more neurologic improvement.  Plan -Keppra, valproate, Vimpat -EEG results from today pending -goals of care discussion with family later today  Acute Hypoxic Respiratory Failure. Mental status barrier to extubation. FiO2 21%, PEEP 5. Became tachypneic following wean to pressure support yesterday. Vent support resumed. Will hold off on repeating weaning trial until after EEG is completed to help avoid confounding factors in result interpretation. White count is stable from  yesterday--left shift present. Pt has remained afebrile overnight. will hold off on further management in this regard for now. Plan Continue full vent support. Wean to pressure support as able following EEG today Can consider repeat CXR later today if we plan to continue aggressive treatment. Depending on EEG results, will need to discuss plans for trach/peg if we plan to continue aggressive management. ET intubation day  ESRD with hyperphosphatemia.  Nephrology following. -on HD at home  -received CRRT 8/28-9/2. Now on intermittent dialysis Plan: MWF schedule  Anemia due to ESRD and chronic disease -chronic disease + superimposed acute illness  -1 unit PRBC 8/31 Plan -Continue to monitor -Transfuse for hemoglobin < 7 unless active bleeding with hemodynamic instability or hemoglobin < 8 if active cardiac ischemia.   HIV. Plan: on Biktarvy  Hepatitis B. Plan: Continue adjusted PTA regimen  At Risk Malnutrition  P: -Continue tube feeds  Best practice:  Diet: TF Pain/Anxiety/Delirium protocol (if indicated): Minimize sedating medications for neuro exam VAP protocol (if indicated): In place  DVT prophylaxis: SQ Heparin  GI prophylaxis: PPI  Mobility: BR  Code Status: Full Code  Family Communication: Will attempt to contact family today to update and discuss future plans.  Disposition: ICU   Mitzi Hansen, MD Internal Medicine Resident PGY--1  05/31/19 6:47 AM

## 2019-05-31 NOTE — Progress Notes (Signed)
EEG complete - results pending 

## 2019-05-31 NOTE — Progress Notes (Signed)
SLP Cancellation Note  Patient Details Name: Jeffrey [name] MRN: [id number] DOB: 03-Oct-1955   Cancelled treatment:       Reason Eval/Treat Not Completed: Patient not medically ready - remains on vent. SLP to sign off at this time. Please reorder when ready.    Venita Sheffield Daniele Dillow 05/31/2019, 7:24 AM  Pollyann Glen, M.A. [place] [facility] education officer [phone] Office [phone]

## 2019-05-31 NOTE — Progress Notes (Signed)
Nutrition Follow-up  DOCUMENTATION CODES:   Not applicable  INTERVENTION:   Tube feeding: - Increase Vital AF 1.2 to 55 ml/hr (1320 ml/day) via OG tube - Pro-stat 30 ml TID  Tube feeding regimen provides 1884 kcal, 144 grams of protein, and 1071 ml of H2O.   - Recommend continuing B-complex with vitamin C  NUTRITION DIAGNOSIS:   Increased nutrient needs related to post-op healing as evidenced by estimated needs.  Ongoing, being addressed via TF  GOAL:   Patient will meet greater than or equal to 90% of their needs  Met via TF  MONITOR:   Diet advancement, Vent status, Labs, Weight trends, TF tolerance, Skin, I & O's  REASON FOR ASSESSMENT:   Ventilator    ASSESSMENT:   Patient with PMH significant for ESRD on home dialysis, AVS, Hepatitis B, HIV, HTN, and seizures. Presents this admission with VF arrest in setting of severe CAD.  8/27 - s/p L heart cath 8/28 - R IJ cath placed, start CRRT  9/02 - CRRT stopped  Discussed pt during ICU rounds and with RN. Per RN, pt tolerating TF without issue. Pt receiving HD on a MWF schedule.  Per RN, family meeting held today but no GOC decisions made. Will continue with tube feeds at this time.  OG tube in place with TF currently infusing. Propofol now off. Will adjust TF regimen accordingly.  Weight stable, up 1 lb compared to admit weight.  Current TF: Vital AF 1.2 @ 30 ml/hr, Pro-stat 60 ml TID  Patient is currently intubated on ventilator support MV: 9.3 L/min Temp (24hrs), Avg:99.2 F (37.3 C), Min:98.6 F (37 C), Max:99.7 F (37.6 C) BP (cuff): 110/69 MAP (cuff): 92  Drips: NS: 10 ml/hr  Medications reviewed and include: B-complex with vitamin C, SSI, Protonix  Labs reviewed: hemoglobin 7.0 CBG's: 98-113 x 24 hours  HD ned UF today: 2000 ml I/O's: +5.9 L since admit  Diet Order:   Diet Order            Diet NPO time specified  Diet effective now              EDUCATION NEEDS:   Not  appropriate for education at this time  Skin:  Skin Assessment: Skin Integrity Issues: Skin Integrity Issues: DTI: R head  Last BM:  05/31/19  Height:   Ht Readings from Last 1 Encounters:  04/25/2019 6' (1.829 m)    Weight:   Wt Readings from Last 1 Encounters:  05/31/19 87.5 kg    Ideal Body Weight:  80.9 kg  BMI:  Body mass index is 26.16 kg/m.  Estimated Nutritional Needs:   Kcal:  1913  Protein:  130-150 grams  Fluid:  1000 ml + UOP    Gaynell Face, MS, RD, LDN Inpatient Clinical Dietitian Pager: [phone] Weekend/After Hours: [phone]

## 2019-05-31 NOTE — Progress Notes (Signed)
Spoke with patient's brother, [name], about coming to the hospital today. He plans to get here around 1300.

## 2019-05-31 NOTE — Progress Notes (Signed)
Patient ID: Jeffrey [name], male   DOB: 1956-04-23, 63 y.o.   MRN: [id number]  [facility] Progress Note   Assessment/ Plan:   1.  Acute hypoxic respiratory failure status post V. fib cardiac arrest: Secondary to LAD STEMI now status post DES to LAD with a background history of bioprosthetic aortic valve replacement.  Remains ventilator dependent without any ability to wean due to encephalopathy. 2. ESRD: Earlier on CRRT due to hemodynamic instability from 8/28-9/2.  He has tolerated intermittent hemodialysis well since then and now is usual Monday/Wednesday/Friday schedule. 3. Anemia: Overnight stable hemoglobin/hematocrit without overt loss, monitor with serial labs. 4. CKD-MBD: Mild hyperphosphatemia with NPO status.  We will continue to follow with hemodialysis. 5. Nutrition: Continue enteral nutritional support per CCM. 6.  Encephalopathy: Metabolic and likely secondary to anoxic injury from V. fib arrest versus cefepime induced per neurology evaluation; awaiting reevaluation with repeat EEG planned for today.  Prognosis guarded at this time.  Subjective:   No acute events noted from overnight.   Objective:   BP 140/86 (BP Location: Right Arm)   Pulse 80   Temp 99.4 F (37.4 C) (Oral)   Resp 14   Ht 6' (1.829 m)   Wt 87.5 kg   SpO2 100%   BMI 26.16 kg/m   Physical Exam: Gen: Somnolent, unresponsive, does not withdraw to pain CVS: Pulse regular rhythm, normal rate, S1 and S2 normal Resp: Anteriorly clear to auscultation, no distinct rales or rhonchi Abd: Soft, obese, nontender Ext: Trace edema over lower extremities.  Left RCF with intact dressings.  Labs: BMET Recent Labs  Lab 05/25/19 0319 05/26/19 0440 05/27/19 0416 05/28/19 0419 05/29/19 0346 05/30/19 0432 05/31/19 0311  NA 136 136 137 138 139 136 136  K 4.2 4.0 4.3 4.7 4.8 4.0 3.8  CL 100 101 101 101 100 96* 96*  CO2 23 21* 20* 20* 20* 25 23  GLUCOSE 120* 109* 95 96 104* 105* 109*  BUN 35* 64*  66* 109* 132* 85* 114*  CREATININE 3.18* 5.63* 5.60* 7.87* 9.67* 6.41* 8.38*  CALCIUM 8.4* 8.7* 8.3* 8.7* 8.7* 8.7* 8.8*  PHOS 2.6 3.9 4.0 5.1* 5.2* 4.2 4.5   CBC Recent Labs  Lab 05/25/19 0910  05/28/19 0419 05/28/19 0656 05/29/19 0346 05/30/19 0432 05/31/19 0751  WBC 14.7*   < > 9.7  --  10.2 12.1* 11.3*  NEUTROABS 11.9*  --   --   --   --   --  9.4*  HGB 6.7*   < > 6.9* 7.3* 7.0* 7.4* 7.0*  HCT 21.9*   < > 22.6* 24.0* 22.7* 24.0* 22.8*  MCV 99.5   < > 98.7  --  99.6 97.2 98.3  PLT 181   < > 207  --  228 292 326   < > = values in this interval not displayed.   Medications:    . aspirin  81 mg Per Tube Daily  . atorvastatin  40 mg Per Tube q1800  . B-complex with vitamin C  1 tablet Per Tube Daily  . chlorhexidine gluconate (MEDLINE KIT)  15 mL Mouth Rinse BID  . Chlorhexidine Gluconate Cloth  6 each Topical Q0600  . darbepoetin (ARANESP) injection - DIALYSIS  60 mcg Intravenous Q Mon-HD  . emtricitabine-tenofovir AF  1 tablet Per Tube Daily   And  . dolutegravir  50 mg Oral Daily  . feeding supplement (PRO-STAT SUGAR FREE 64)  60 mL Per Tube TID  . feeding supplement (VITAL  AF 1.2 CAL)  1,000 mL Per Tube Q24H  . heparin injection (subcutaneous)  5,000 Units Subcutaneous Q8H  . insulin aspart  1-3 Units Subcutaneous BID WC  . mouth rinse  15 mL Mouth Rinse 10 times per day  . metoprolol tartrate  25 mg Per Tube BID  . pantoprazole sodium  40 mg Per Tube QHS  . ticagrelor  90 mg Per Tube BID   Elmarie Shiley, MD 05/31/2019, 8:46 AM

## 2019-05-31 NOTE — Procedures (Signed)
Name:Mamadou SHEY BARTMESS YFR:[id number] Epilepsy Attending:Brisha Mccabe [name] Referring Physician/Provider:Dr Amie Portland Date: 05/31/2019 Duration: 28.41 mins  Patient history:63yo M with cardiac arrest, now on TTM. EEG to evaluate for seizures.  Level of alertness:comatose  AEDs during EEG study:LEV, LCM  Technical aspects: This EEG study was done with scalp electrodes positioned according to the 10-20 International system of electrode placement. Electrical activity was acquired at a sampling rate of 500Hz  and reviewed with a high frequency filter of 70Hz  and a low frequency filter of 1Hz . EEG data were recorded continuously and digitally stored.  DESCRIPTION:EEGshowed frequentgeneralized perioidc epileptiform discharges with triphasic morphology which at times were more frequent at the rateof 1.5-2 Hz and with overriding fast activity. Hyperventilation and photic stimulation were not performed.  IMPRESSION:  This studycontinues to show generalized periodic epileptiform discharges with triphasic morphology which at times appear more rhythmic without any evolution and are on the ictal-interictal continuum. Additionally,thereisevidence of severe degree of encephalopathy. This EEG is similar to previous LTM study on 05/28/2019.     Lyberti Thrush [name]

## 2019-05-31 NOTE — Progress Notes (Signed)
Goal of care discussion held with patient's brother, Jeffrey [name], and patient's sister, Jeffrey [name]. Jeffrey [name]'s nurse, Rutha Bouchard RN, was at bedside as well.   We reviewed his current situation including most recent EEG is neurologic findings.  We discussed how it would be impossible to predict, with complete certainty, whether or not there would be improvement from his current condition and if so, how much improvement would occur. I expressed to them that he will require time with long term acute care nursing. Jeffrey [name], patient's son, stated that he would want to take him home. He recognized that this would be difficult given the level of care that he would need. He noted that he would not want Jeffrey [name] to be in a nursing home where he would be unable to visit for the time being due to the current COVID epidemic.  In regards to tracheostomy and long term feeding tube, Jeffrey [name] does not believe that patient would want to be in a vegetative state and therefore would not want the feeding tube and tracheostomy.  We discussed what moving the patient to comfort care would look like as well. It was at that point that Jeffrey [name], patient's sister, said that this would be murder. We did attempt to explain to her that, in making the him comfort care, our goal would be to make him comfortable without prolonging his life with the use of mechanical ventilation and feeding tube. After having some time to discuss with with sister, patient's brother endorses again that Jeffrey [name] would not want to live in such a debilitative state. He asked that he have the night to think about this and discuss further with his sister.  We did revisit code status at which time he noted that he would want patient to remain full code over night. At the end of our conversation, I reviewed contact information. Jeffrey [name]'s contact information can be found in the demographic contact section however Jeffrey [name]'s is not. When I inquired about obtaining her contact information, Jeffrey [name]  noted "you can go through him" (pointing to Jeffrey [name]).  Will continue discussion tomorrow.

## 2019-05-31 NOTE — Progress Notes (Signed)
GOC family meeting occurred around 1330 today with Dr. Darrick Meigs leading the conversation. The brother and sister would like to think about the conversation overnight and will let us know in the coming days of any decisions made.

## 2019-05-31 NOTE — Progress Notes (Addendum)
NEUROLOGY PROGRESS NOTE  Subjective: Currently intubated  Exam: Vitals:   05/31/19 1000 05/31/19 1100  BP: 124/75   Pulse:    Resp: 14   Temp:  99.7 F (37.6 C)  SpO2:      Physical Exam   HEENT-  Normocephalic, no lesions, without obvious abnormality.  Normal external eye and conjunctiva.   Musculoskeletal-no joint tenderness, deformity or swelling Skin-warm and dry, no hyperpigmentation, vitiligo, or suspicious lesions Neuro:  Mental Status: Patient does not respond to verbal stimuli.  Grimaces and localizes with the left hand to deep sternal rub.  Does not follow commands.  No verbalizations intubated.  Breathing over the vent  cranial Nerves: II: patient does not respond confrontation bilaterally,  III,IV,VI: doll's response present bilaterally. pupils right 2 mm, left 2 mm,and reactive bilaterally V,VII: corneal reflex present bilaterally  VIII: patient does not respond to verbal stimuli IX,X: gag reflex present, XI: trapezius strength unable to test bilaterally XII: tongue strength unable to test Motor: Extremities flaccid on right side.  Withdraws to pain on left side.  After multiple attempts with noxious stimulation, and at one point did spontaneously open eyes and move his left arm. Sensory: Does not respond to noxious stimuli in any extremity. Deep Tendon Reflexes:  1+ throughout Plantars: downgoing bilaterally Cerebellar: Unable to perform    Medications:  Scheduled: . aspirin  81 mg Per Tube Daily  . atorvastatin  40 mg Per Tube q1800  . B-complex with vitamin C  1 tablet Per Tube Daily  . chlorhexidine gluconate (MEDLINE KIT)  15 mL Mouth Rinse BID  . Chlorhexidine Gluconate Cloth  6 each Topical Q0600  . darbepoetin (ARANESP) injection - DIALYSIS  60 mcg Intravenous Q Mon-HD  . emtricitabine-tenofovir AF  1 tablet Per Tube Daily   And  . dolutegravir  50 mg Oral Daily  . feeding supplement (PRO-STAT SUGAR FREE 64)  60 mL Per Tube TID  . feeding  supplement (VITAL AF 1.2 CAL)  1,000 mL Per Tube Q24H  . heparin injection (subcutaneous)  5,000 Units Subcutaneous Q8H  . insulin aspart  1-3 Units Subcutaneous BID WC  . mouth rinse  15 mL Mouth Rinse 10 times per day  . metoprolol tartrate  25 mg Per Tube BID  . pantoprazole sodium  40 mg Per Tube QHS  . ticagrelor  90 mg Per Tube BID  . white petrolatum   Topical BID    Pertinent Labs/Diagnostics: -BUN 114 -Creatinine 8.38- HD patient -CBC 11.3 -EEG pending   Etta Quill PA-C [facility] [phone]  Attending Neurohospitalist Addendum Patient seen and examined with APP/Resident. Agree with the history and physical as documented above.  Personally reviewed imaging.   Assessment: 63 year old male status post cardiac arrest with concern for hypoxic/anoxic brain injury and MRI showing  multifocal acute ischemic infarct involving bilateral cerebral and cerebellar hemispheres.  Most likely central thromboembolic etiology.  EEG was obtained that showed generalized periodic epileptiform discharges-G peds due to anoxic injury but other factors such as cefepime toxicity were also considered.  Attempts to burst suppressed were successful but upon discontinuing sedation, G peds returned. Continues to be extremely encephalopathic-repeat EEG shows continuing GPEDs and triphasic waves along with severe encephalopathy even with the reduction/discontinuation of the cephalosporins and sedation.  Recommendations: -Continue Vimpat 150 mg twice daily -Continue Keppra 1 g daily with additional 500 mg after dialysis  As far as prognostication is concerned, at this point, given that he has had a significant amount of strokes  and possible hypoxic insult to the brain from the cardiac arrest, and his comorbidities that include a prior stroke with residual hemiparesis, hypertension, hepatitis B, end-stage renal disease and HIV along with the fact that his seizures have been difficult to  control and his brain continues to show a generalized periodic epileptiform pattern without evolution into seizures, it is hard to say how much recovery is possible. Unlike a severe anoxic brain injury patient, he has preserved brainstem reflexes and has some meaningful response to noxious stimulation on the left but his mental status has not improved much. A part of the problem here might be the sedation and the possible cefepime encephalopathy but given that he has had good supportive care as far as his cardiac renal and general medical health is concerned, I would have expected to see more improvement.  Not seeing such improvement makes neurologically meaningful recovery chances lower. Had a detailed discussion with Dr. Lamonte Sakai.    05/31/2019, 11:15 AM  CRITICAL CARE ATTESTATION Performed by: Amie Portland, MD Total critical care time: 30 minutes Critical care time was exclusive of separately billable procedures and treating other patients and/or supervising APPs/Residents/Students Critical care was necessary to treat or prevent imminent or life-threatening deterioration due to hypoxic brain injury, strokes, multifactorial encephalopathy This patient is critically ill and at significant risk for neurological worsening and/or death and care requires constant monitoring. Critical care was time spent personally by me on the following activities: development of treatment plan with patient and/or surrogate as well as nursing, discussions with consultants, evaluation of patient's response to treatment, examination of patient, obtaining history from patient or surrogate, ordering and performing treatments and interventions, ordering and review of laboratory studies, ordering and review of radiographic studies, pulse oximetry, re-evaluation of patient's condition, participation in multidisciplinary rounds and medical decision making of high complexity in the care of this patient.   -- Amie Portland,  MD [facility] Pager: [phone] If 7pm to 7am, please call on call as listed on AMION.

## 2019-05-31 NOTE — Consult Note (Signed)
[place] Nurse wound follow up Wound type: MDRPI to forehead, seen today in follow up.  Areas are dry and most shallow lesion has fallen off to reveal a healed area. Measurement:1cm x 4cm with scabbed lesions.   Wound bed: Dry, scabbing Drainage (amount, consistency, odor) None Periwound:intact Dressing procedure/placement/frequency: Once daily petrolatum will be increased to twice daily to keep scabbing areas moist and diminish likelihood of scarring.  WOC Nursing will continue to follow until healed and will remain available to this patient, the nursing and medical teams.     Thanks, Maudie Flakes, MSN, RN, [place], Arther Abbott  Pager# [phone]

## 2019-06-01 DIAGNOSIS — Z7189 Other specified counseling: Secondary | ICD-10-CM | POA: Insufficient documentation

## 2019-06-01 DIAGNOSIS — Z515 Encounter for palliative care: Secondary | ICD-10-CM | POA: Insufficient documentation

## 2019-06-01 LAB — CBC
HCT: 24.3 % — ABNORMAL LOW (ref 39.0–52.0)
Hemoglobin: 7.4 g/dL — ABNORMAL LOW (ref 13.0–17.0)
MCH: 30.2 pg (ref 26.0–34.0)
MCHC: 30.5 g/dL (ref 30.0–36.0)
MCV: 99.2 fL (ref 80.0–100.0)
Platelets: 334 10*3/uL (ref 150–400)
RBC: 2.45 MIL/uL — ABNORMAL LOW (ref 4.22–5.81)
RDW: 17.8 % — ABNORMAL HIGH (ref 11.5–15.5)
WBC: 11.6 10*3/uL — ABNORMAL HIGH (ref 4.0–10.5)
nRBC: 0.2 % (ref 0.0–0.2)

## 2019-06-01 LAB — GLUCOSE, CAPILLARY
Glucose-Capillary: 105 mg/dL — ABNORMAL HIGH (ref 70–99)
Glucose-Capillary: 109 mg/dL — ABNORMAL HIGH (ref 70–99)
Glucose-Capillary: 98 mg/dL (ref 70–99)
Glucose-Capillary: 98 mg/dL (ref 70–99)

## 2019-06-01 LAB — RENAL FUNCTION PANEL
Albumin: 2.3 g/dL — ABNORMAL LOW (ref 3.5–5.0)
Anion gap: 14 (ref 5–15)
BUN: 70 mg/dL — ABNORMAL HIGH (ref 8–23)
CO2: 25 mmol/L (ref 22–32)
Calcium: 8.9 mg/dL (ref 8.9–10.3)
Chloride: 97 mmol/L — ABNORMAL LOW (ref 98–111)
Creatinine, Ser: 6.13 mg/dL — ABNORMAL HIGH (ref 0.61–1.24)
GFR calc Af Amer: 10 mL/min — ABNORMAL LOW (ref >60)
GFR calc non Af Amer: 9 mL/min — ABNORMAL LOW (ref >60)
Glucose, Bld: 118 mg/dL — ABNORMAL HIGH (ref 70–99)
Phosphorus: 3.5 mg/dL (ref 2.5–4.6)
Potassium: 3.8 mmol/L (ref 3.5–5.1)
Sodium: 136 mmol/L (ref 135–145)

## 2019-06-01 LAB — MAGNESIUM: Magnesium: 1.9 mg/dL (ref 1.7–2.4)

## 2019-06-01 MED ORDER — INFLUENZA VAC A&B SA ADJ QUAD 0.5 ML IM PRSY: 0.5000 mL | PREFILLED_SYRINGE | INTRAMUSCULAR | Status: DC

## 2019-06-01 NOTE — Consult Note (Signed)
Consultation Note Date: 06/01/2019   Patient Name: Jeffrey [name]  DOB: 10/22/1955  MRN: [id number]  Age / Sex: 63 y.o., male  PCP: Jeffrey Ochs, MD Referring Physician: Collene Gobble, MD  Reason for Consultation: Establishing goals of care  HPI/Patient Profile: 63 y.o. male  with past medical history of ESRD on HD at home, CABG and valve replacement, HIV, Hep B, OSA, Epilepsy, COPD, CAD, spodylosis admitted on 05/05/2019 with STEMI. He was found down by his brother in his home- EMS administered CPR for 35 minutes during which he was cardioverted 5 times noted to be in Vfib. He underwent cooling protocol and cardiac cath with placement of medication eluding stent in the LAD which was occluded 100%. He has recovered from cardiogenic shock, however, has remained encephalopathic likely due to anoxic brain injury and embolic CVA's during V. fib arrest and possible cefepime toxicity. EEG showed G peds (likely due to anoxic injury- and per research indicates poor prognosis for neurologic recovery). Attempts to burst suppress with propofol were made, however, G peds returned after sedation discontinued. Clinically, patient has not improved- remains responsive to noxious stimuli only, does not follow commands. Per RN- today when put on pressure support he was not able to tolerate it with quickly increasing RR. He has been treated for pseudomonas and serratia in tracheal aspirate. CBC shows trending WBC, and low grade temp max 100.6- possibly indicating onset of new infection. He is currently on his regular HD schedule. Critical care team met with patient's siblings- Jeffrey [name] and Jeffrey [name] to discuss [place]. Jeffrey [name] indicated patient would not trach/PEG and long term care, however sister Jeffrey [name] was in disagreement. Palliative medicine consulted to assist with further [place].  Clinical Assessment and Goals of Care:  I have reviewed medical  records including EPIC notes, labs and imaging, received report from patient's RN assessed the patient and then met with Jeffrey [name] and Jeffrey [name]  to discuss diagnosis prognosis, GOC, EOL wishes, disposition and options.  I introduced Palliative Medicine as specialized medical care for people living with serious illness. It focuses on providing relief from the symptoms and stress of a serious illness. The goal is to improve quality of life for both the patient and the family.  We discussed a brief life review of the patient. He is a Therapist, nutritional. He enjoyed playing the piano and organ. Loves gospel music. He was a first grade teacher.   As far as functional and nutritional status- prior to admission he spent much time at home and in bed due to his dialysis. His brother performed his dialysis at home. Jeffrey [name] notes that Jeffrey [name] health had been declining for some time especially since his admission in February where he underwent a VATS.   We discussed his current illness and what it means in the larger context of his on-going co-morbidities.  Natural disease trajectory and expectations at EOL were discussed.   I attempted to elicit values and goals of care important to the patient. We discussed what the definition of "living" would be to  the patient.   The difference between aggressive medical intervention and comfort care was considered in light of the patient's goals of care.   Advanced directives, concepts specific to code status, artifical feeding and hydration, and rehospitalization were considered and discussed.   Questions and concerns were addressed.  Hard Choices booklet left for review.    Jeffrey [name] states Jeffrey [name] would not want trach and PEG. Jeffrey [name] is ready to transition to comfort care and allow for natural dying process. After our conference I met with Jeffrey [name] and Jeffrey [name] separately. Jeffrey [name] feels that Jeffrey [name] feels guilty for not being involved in Jeffrey [name]'s care sooner and is unwilling to "let go". He also feels that  she doesn't want to have to make a decision and just wants to be told what is going to happen.  I also met separately with Jeffrey [name]:  Jeffrey [name] is conflicted due to her deeply held belief that she cannot make a decision to "pull the plug". However, she also agrees that patient would not want trach/PEG and LTC placement, and she also would not want this for her brother. She notes that she simply is in a place where she cannot be the one "to make that decision".  I agreed then that medical providers would no longer discuss "the decision" with her or put her in a place where she was the one who had to make a decision. She agreed to this.  When I asked if she would want to be with Jeffrey [name] when he was transitioned to comfort and transitioned to comfort care she states that she would. She notes that she has a flight to [place] on Saturday. We discussed then that we would need to make that transition tomorrow and she agreed.   I encouraged both Jeffrey [name] and Jeffrey [name] to share these feelings with each other. Neither of them wanted me to disclose my conversation with them to the other.   Primary Decision Maker NEXT OF KINJeneen [name]- patient's brother    SUMMARY OF RECOMMENDATIONS -DNR -Plan for transition to comfort tomorrow when family arrives     Code Status/Advance Care Planning:  DNR  Prognosis:    Unable to determine  Discharge Planning: Anticipated Hospital Death  Primary Diagnoses: Present on Admission: . Cardiac arrest ([place])   I have reviewed the medical record, interviewed the patient and family, and examined the patient. The following aspects are pertinent.  Past Medical History:  Diagnosis Date  . Anemia   . Anxiety   . Aortic valve stenosis   . Arthritis   . Asthma    per pt hx  . Dyspnea   . End stage renal disease on home HD 07/10/2011   Started HD in September 2012 at [facility] with a tunneled HD catheter, now on home HD with NxtStage. Dialyzing through AVF L lower arm with buttonhole technique  as of mid 2014. His brother does the HD treatments at home.  They are roommates for 23 years.  The brother works 3rd shift and gets off about 8am and then puts Mr Hobbins on HD in the morning after getting home. Most of the time he does HD about 4 times a week, for about 4 hours per treatment. Cause of ESRD was HTN according to patient. He says he let his health go and ending up with complications, and that he didn't like seeing doctors in those days.  He says he was diagnosed with severe HTN when he lived in [place] in his 45's.   . Hepatitis B  carrier ([name])   . HIV infection ([place])   . Hypertension   . Hyperthyroidism    normal now  . Pneumonia several yrs ago  . Seizures ([place]) 06/02/2011   had a mild one in Sept. 2019  . Sleep apnea    to start Cpap soon  . Thrombocytopenia ([place])    Social History   Socioeconomic History  . Marital status: Single    Spouse name: Not on file  . Number of children: Not on file  . Years of education: Not on file  . Highest education level: Not on file  Occupational History  . Not on file  Social Needs  . Financial resource strain: Not on file  . Food insecurity    Worry: Not on file    Inability: Not on file  . Transportation needs    Medical: Not on file    Non-medical: Not on file  Tobacco Use  . Smoking status: Former Smoker    Years: 10.00    Types: Cigarettes    Quit date: 08/07/2012    Years since quitting: 6.8  . Smokeless tobacco: Never Used  . Tobacco comment: casual smoking for 10 years  Substance and Sexual Activity  . Alcohol use: No    Alcohol/week: 0.0 standard drinks    Comment: quit 2004  . Drug use: No    Comment: uds (+) cocaine in 08/2011 but pt states was taking sudafed at the time  . Sexual activity: Not Currently    Partners: Male    Comment: offered condoms  Lifestyle  . Physical activity    Days per week: Not on file    Minutes per session: Not on file  . Stress: Not on file  Relationships  . Social  Herbalist on phone: Not on file    Gets together: Not on file    Attends religious service: Not on file    Active member of club or organization: Not on file    Attends meetings of clubs or organizations: Not on file    Relationship status: Not on file  Other Topics Concern  . 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 Hypertension Mother   . Diabetes Mother   . Cancer Father    Scheduled Meds: . aspirin  81 mg Per Tube Daily  . atorvastatin  40 mg Per Tube q1800  . B-complex with vitamin C  1 tablet Per Tube Daily  . chlorhexidine gluconate (MEDLINE KIT)  15 mL Mouth Rinse BID  . Chlorhexidine Gluconate Cloth  6 each Topical Q0600  . darbepoetin (ARANESP) injection - DIALYSIS  60 mcg Intravenous Q Mon-HD  . emtricitabine-tenofovir AF  1 tablet Per Tube Daily   And  . dolutegravir  50 mg Oral Daily  . feeding supplement (PRO-STAT SUGAR FREE 64)  30 mL Per Tube TID  . heparin injection (subcutaneous)  5,000 Units Subcutaneous Q8H  . [START ON 06/05/2019] influenza vaccine adjuvanted  0.5 mL Intramuscular Tomorrow-1000  . insulin aspart  1-3 Units Subcutaneous BID WC  . mouth rinse  15 mL Mouth Rinse 10 times per day  . metoprolol tartrate  25 mg Per Tube BID  . pantoprazole  sodium  40 mg Per Tube QHS  . ticagrelor  90 mg Per Tube BID  . white petrolatum   Topical BID   Continuous Infusions: . sodium chloride    . sodium chloride    . sodium chloride 10 mL/hr at 06/01/19 1600  . feeding supplement (VITAL AF 1.2 CAL) 1,000 mL (05/31/19 2148)  . ferric gluconate (FERRLECIT/NULECIT) IV Stopped (05/31/19 1220)  . lacosamide (VIMPAT) IV Stopped (06/01/19 8676)  . levETIRAcetam Stopped (06/01/19 1110)  . levETIRAcetam Stopped (05/31/19 1737)   PRN  Meds:.sodium chloride, sodium chloride, sodium chloride, acetaminophen (TYLENOL) oral liquid 160 mg/5 mL, albuterol, alteplase, fentaNYL (SUBLIMAZE) injection, heparin, heparin, lidocaine (PF), lidocaine-prilocaine, metoprolol tartrate, ondansetron (ZOFRAN) IV, pentafluoroprop-tetrafluoroeth, sodium chloride Medications Prior to Admission:  Prior to Admission medications   Medication Sig Start Date End Date Taking? Authorizing Provider  acetaminophen (TYLENOL) 325 MG tablet Take 2 tablets (650 mg total) by mouth every 6 (six) hours as needed for fever, headache, mild pain or moderate pain. 08/17/18  Yes Conte, Tessa N, PA-C  albuterol (PROVENTIL) (2.5 MG/3ML) 0.083% nebulizer solution Take 3 mLs (2.5 mg total) by nebulization every 6 (six) hours as needed for wheezing or shortness of breath. 09/05/18  Yes Medina-Vargas, Monina C, NP  Amino Acids-Protein Hydrolys (FEEDING SUPPLEMENT, PRO-STAT SUGAR FREE 64,) LIQD Take 30 mLs by mouth daily.   Yes [provider]  amLODipine (NORVASC) 10 MG tablet TAKE 1 TABLET BY MOUTH DAILY Patient taking differently: Take 10 mg by mouth daily.  05/12/19  Yes Aldine Contes, MD  aspirin EC 81 MG tablet Take 81 mg by mouth daily.   Yes [provider]  BIKTARVY 50-200-25 MG TABS tablet TAKE 1 TABLET BY MOUTH DAILY Patient taking differently: Take 1 tablet by mouth daily.  12/27/18  Yes Campbell Riches, MD  cloNIDine (CATAPRES - DOSED IN MG/24 HR) 0.3 mg/24hr patch Place 1 patch (0.3 mg total) onto the skin once a week. 04/18/19  Yes Axel Filler, MD  diclofenac sodium (VOLTAREN) 1 % GEL Apply 2 g topically 4 (four) times daily as needed (Pain). Patient taking differently: Apply 2 g topically 4 (four) times daily as needed (Pain). Shoulder 03/10/19  Yes Neva Seat, MD  epoetin alfa (EPOGEN) 10000 UNIT/ML injection Inject 10,000 Units as directed See admin instructions. Three times a week   Yes [provider]  hydrALAZINE  (APRESOLINE) 100 MG tablet Take 1 tablet (100 mg total) by mouth 3 (three) times daily. 11/15/18  Yes Jeffrey Ochs, MD  iron sucrose (VENOFER) 20 MG/ML injection Inject 500 mg into the vein every 14 (fourteen) days. At home   Yes [provider]  isosorbide mononitrate (ISMO,MONOKET) 20 MG tablet Take 1 tablet (20 mg total) by mouth 2 (two) times daily at 10 AM and 5 PM. 11/15/18  Yes Jeffrey Ochs, MD  Lacosamide (VIMPAT) 150 MG TABS Take 1 tablet (150 mg total) by mouth 2 (two) times daily. 04/06/19  Yes Suzzanne Cloud, NP  lactulose (CHRONULAC) 10 GM/15ML solution Take 30 mLs (20 g total) by mouth daily as needed for moderate constipation. 11/25/18  Yes Jeffrey Ochs, MD  losartan (COZAAR) 25 MG tablet Take 25 mg by mouth daily.   Yes [provider]  multivitamin (RENA-VIT) TABS tablet Take 1 tablet by mouth daily. 09/05/18  Yes Medina-Vargas, Monina C, NP  NONFORMULARY OR COMPOUNDED ITEM [facility] Terbinafine 3% Fluconazole 2% Tea Tree Oil 5% Urea 10% Ibuprofen 2% in DMSO suspension #48m sHMC:NOBSJto the  affected nail(S) once (at bedtime) Antifungal Patient taking differently: Apply 1 application topically 2 (two) times daily. [facility] Terbinafine 3% Fluconazole 2% Tea Tree Oil 5% Urea 10% Ibuprofen 2% in DMSO suspension #76m sBTC:YELYHto the affected nail(S) once (at bedtime) Antifungal 03/01/19  Yes [place], JStephani Police DPM  omeprazole (PRILOSEC) 20 MG capsule Take 1 capsule (20 mg total) by mouth daily. 11/15/18  Yes GVelna Ochs MD  traMADol (ULTRAM) 50 MG tablet Take 1 tablet (50 mg total) by mouth every 12 (twelve) hours as needed. 02/16/19  Yes HMelrose Nakayama MD  traZODone (DESYREL) 50 MG tablet TAKE 1 TABLET(50 MG) BY MOUTH AT BEDTIME AS NEEDED FOR SLEEP Patient taking differently: Take 50 mg by mouth at bedtime as needed for sleep.  02/14/19  Yes HCampbell Riches MD   Allergies  Allergen Reactions  . Lisinopril Anaphylaxis  and Shortness Of Breath    Throat swelling  . Penicillins Anaphylaxis and Other (See Comments)    Childhood allergy Has patient had a PCN reaction causing immediate rash, facial/tongue/throat swelling, SOB or lightheadedness with hypotension: Yes Has patient had a PCN reaction causing severe rash involving mucus membranes or skin necrosis: Unk Has patient had a PCN reaction that required hospitalization: Unk Has patient had a PCN reaction occurring within the last 10 years: No If all of the above answers are "NO", then may proceed with Cephalosporin use.   . Fentanyl Other (See Comments)    Lethargy, AMS  . Morphine And Related Other (See Comments)    "Not Himself"   Review of Systems  Unable to perform ROS: Intubated    Physical Exam Vitals signs and nursing note reviewed.  Constitutional:      Appearance: He is ill-appearing.  Pulmonary:     Comments: intubated Neurological:     Comments: Responds to noxious stimuli, does not follow commands     Vital Signs: BP (!) 160/92   Pulse 88   Temp 99.2 F (37.3 C) (Oral)   Resp 18   Ht 6' (1.829 m)   Wt 87.2 kg   SpO2 99%   BMI 26.07 kg/m  Pain Scale: CPOT POSS *See Group Information*: 2-Acceptable,Slightly drowsy, easily aroused Pain Score: Asleep   SpO2: SpO2: 99 % O2 Device:SpO2: 99 % O2 Flow Rate: .O2 Flow Rate (L/min): 40 L/min  IO: Intake/output summary:   Intake/Output Summary (Last 24 hours) at 06/01/2019 1729 Last data filed at 06/01/2019 1600 Gross per 24 hour  Intake 1757.29 ml  Output 0 ml  Net 1757.29 ml    LBM: Last BM Date: 06/01/19 Baseline Weight: Weight: 87 kg Most recent weight: Weight: 87.2 kg     Palliative Assessment/Data: PPS: 10%     Thank you for this consult. Palliative medicine will continue to follow and assist as needed.   Time In: 1530 Time Out: 1700 Time Total: 90 minutes Prolonged services billed: yes Greater than 50%  of this time was spent counseling and coordinating  care related to the above assessment and plan.  Signed by: KMariana Kaufman AGNP-C Palliative Medicine    Please contact Palliative Medicine Team phone at [phone] questions and concerns.  For individual provider: See AShea Evans

## 2019-06-01 NOTE — Progress Notes (Signed)
Palliative note:   Consult received and chart reviewed.   I called and left a message for [name]- patient's brother- requesting to meet with him and his sister for [place] discussion.   Mariana Kaufman, AGNP-C Palliative Medicine  Please call Palliative Medicine team phone with any questions [phone]. For individual providers please see AMION.  No charge

## 2019-06-01 NOTE — Progress Notes (Signed)
Patient ID: Jeffrey [name], male   DOB: [birth year], 63 y.o.   MRN: [id number]  [facility] Progress Note   Assessment/ Plan:   1.  Acute hypoxic respiratory failure status post V. fib cardiac arrest: Secondary to LAD STEMI now status post DES to LAD with a background history of bioprosthetic aortic valve replacement.  Remains ventilator dependent without any ability to wean due to encephalopathy. 2. ESRD: Earlier on CRRT due to hemodynamic instability from 8/28-9/2.  He has tolerated intermittent hemodialysis well since then and now back on his usual Monday/Wednesday/Friday schedule-next hemodialysis tomorrow. 3. Anemia: Overnight stable hemoglobin/hematocrit without overt loss, monitor with serial labs. 4. CKD-MBD: Calcium and phosphorus level within acceptable range on hemodialysis/chronic nutritional support. 5. Nutrition: Continue enteral nutritional support per CCM. 6.  Encephalopathy: Metabolic and likely secondary to anoxic injury from V. fib arrest versus cefepime induced per neurology evaluation; poor overall outlook with limited chances for meaningful recovery.  Subjective:   Goals of care meeting held yesterday, notes reviewed-patient's brother/sister will attempt to make a decision over the next few days regarding trajectory of care but leaning towards comfort care/palliative care rather than LTAC.   Objective:   BP (!) 149/90   Pulse 84   Temp 99.7 F (37.6 C) (Oral)   Resp 16   Ht 6' (1.829 m)   Wt 87.2 kg   SpO2 96%   BMI 26.07 kg/m   Physical Exam: Gen: Somnolent, unresponsive, does not withdraw to pain CVS: Pulse regular rhythm, normal rate, S1 and S2 normal Resp: Anteriorly clear to auscultation, no distinct rales or rhonchi Abd: Soft, obese, nontender Ext: Trace edema over lower extremities.  Left RCF with intact dressings.  Labs: BMET Recent Labs  Lab 05/26/19 0440 05/27/19 0416 05/28/19 0419 05/29/19 0346 05/30/19 0432 05/31/19 0311  06/01/19 0302  NA 136 137 138 139 136 136 136  K 4.0 4.3 4.7 4.8 4.0 3.8 3.8  CL 101 101 101 100 96* 96* 97*  CO2 21* 20* 20* 20* '25 23 25  ' GLUCOSE 109* 95 96 104* 105* 109* 118*  BUN 64* 66* 109* 132* 85* 114* 70*  CREATININE 5.63* 5.60* 7.87* 9.67* 6.41* 8.38* 6.13*  CALCIUM 8.7* 8.3* 8.7* 8.7* 8.7* 8.8* 8.9  PHOS 3.9 4.0 5.1* 5.2* 4.2 4.5 3.5   CBC Recent Labs  Lab 05/25/19 0910  05/29/19 0346 05/30/19 0432 05/31/19 0751 06/01/19 0306  WBC 14.7*   < > 10.2 12.1* 11.3* 11.6*  NEUTROABS 11.9*  --   --   --  9.4*  --   HGB 6.7*   < > 7.0* 7.4* 7.0* 7.4*  HCT 21.9*   < > 22.7* 24.0* 22.8* 24.3*  MCV 99.5   < > 99.6 97.2 98.3 99.2  PLT 181   < > 228 292 326 334   < > = values in this interval not displayed.   Medications:    . aspirin  81 mg Per Tube Daily  . atorvastatin  40 mg Per Tube q1800  . B-complex with vitamin C  1 tablet Per Tube Daily  . chlorhexidine gluconate (MEDLINE KIT)  15 mL Mouth Rinse BID  . Chlorhexidine Gluconate Cloth  6 each Topical Q0600  . darbepoetin (ARANESP) injection - DIALYSIS  60 mcg Intravenous Q Mon-HD  . emtricitabine-tenofovir AF  1 tablet Per Tube Daily   And  . dolutegravir  50 mg Oral Daily  . feeding supplement (PRO-STAT SUGAR FREE 64)  30 mL Per Tube TID  .  heparin injection (subcutaneous)  5,000 Units Subcutaneous Q8H  . insulin aspart  1-3 Units Subcutaneous BID WC  . mouth rinse  15 mL Mouth Rinse 10 times per day  . metoprolol tartrate  25 mg Per Tube BID  . pantoprazole sodium  40 mg Per Tube QHS  . ticagrelor  90 mg Per Tube BID  . white petrolatum   Topical BID   Elmarie Shiley, MD 06/01/2019, 7:47 AM

## 2019-06-01 NOTE — Progress Notes (Signed)
NAME:  Jeffrey [name], MRN:  [id number], DOB:  09/12/1956, LOS: 21 ADMISSION DATE:  05/01/2019, CONSULTATION DATE:  8/27  REFERRING MD: 8/27, CHIEF COMPLAINT: Cardiac arrest  Brief History   63 year old M admitted to [facility] on 8/27 status post V. fib arrest with an estimated downtime of 35 minutes before ROSC.  Completed TTM protocol.    Past Medical History  HIV Diffuse nonobstructive CAD Hepatitis B ESRD on home HD Hypothyroidism Peripheral vascular disease Prior GI bleed Aortic valve insufficiency s/p aortic valve endocarditis requiring replacement in 2019 Left VATS for fibrothorax following bacteremia in 10/2018  Significant Hospital Events   8/27  Admit with VF arrest, ~35 minute downtime 8/28  Re-warming, pressor dependent, EEG neg for seizure, amio stopped d/t brady, HD  8/29  Off Levophed, rewarmed 0330, off sedation  8/30  Heparin gtt initiated with EKG changes concerning for ischemia  8/31  Heparin gtt stopped  9/02  CVVHD stopped    Consults:  Cardiology Neurology Nephrology Palliative  Procedures:  ETT 8/27 >>  L IJ CVL 8/27 >>  L Femoral HD 8/28 >> remove on 9/7  Significant Diagnostic Tests:  ECHO 8/27 >> LVEF 45 to 50% with left ventricular wall dyskinesis, and AF at time of exam EEG 8/27 >> negative for seizure LHC 8/28 >> mid LAD lesion 100% stenosis, drug-eluting stent placed, distal apical LAD lesion stenosis 80% treated with PTCA, nonobstructive disease involving the circumflex and RCA EEG 8/30 >> frequent sharply contoured generalized discharges with triphasic morphology.  Irritability versus cefepime neurotoxicity CT Head 8/31 >> small vessel white matter disease MRI 9/1 >> patchy multifocal acute ischemic infarcts involving the bilateral cerebral and cerebellar hemispheres-suspicious for central thromboembolic etiology.  Petechial hemorrhage about 1 of these infarcts of the right parietal cortex.  Underlying atrophy with chronic microvascular disease EEG  9/3 >> improved triphasic waves following initiation of antiseizure medications EEG 9/4 >> return of triphasic waves with propofol wean   Micro Data:  COVID 8/27 >> negative  Tracheal aspirate 8/28 >> moderate pseudomonas aeruginosa >> S-ceftaz           Serratia marcescens >> R- cefazolin, S-ceftaz Antimicrobials:  Cefepime 8/29 >> 9/2 Ceftazidime 9/2 >> 9/5  Interim history/subjective:  Discussion held with patient's brother and sister. See that note for further details.  Objective   Blood pressure (!) 149/90, pulse 84, temperature 99.7 F (37.6 C), temperature source Oral, resp. rate 16, height 6' (1.829 m), weight 87.2 kg, SpO2 96 %.    Vent Mode: PRVC FiO2 (%):  [21 %] 21 % Set Rate:  [14 bmp] 14 bmp Vt Set:  [620 mL] 620 mL PEEP:  [5 cmH20] 5 cmH20 Plateau Pressure:  [15 cmH20-17 cmH20] 17 cmH20   Intake/Output Summary (Last 24 hours) at 06/01/2019 0720 Last data filed at 06/01/2019 0700 Gross per 24 hour  Intake 1985.4 ml  Output 2000 ml  Net -14.6 ml   Filed Weights   05/30/19 0439 05/31/19 0317 06/01/19 0500  Weight: 88.1 kg 87.5 kg 87.2 kg    Examination: General: Chronically ill-appearing man lying in bed in no acute distress HENT: Normocephalic, atraumatic, large protruding tongue unchanged.  Mucous membranes moist. Eyes: Anicteric, no scleral icteris. Neuro: Eyes do not spontaneously open.  No response to verbal stimulation.  Grimace and withdraw of RUE to noxious stimuli. Positive gag, cough, corneal, pupillary reflexes. Downward gaze.  CV: Regular rate and rhythm, systolic murmur at the apex. PULM:  Clear to auscultation bilaterally. On  ventilator GI: Soft, nontender, nondistended.  Hypoactive bowel sounds. Extremities: Warm.  Skin: No rashes  Resolved Hospital Problem list   Shock - initial component cardiogenic +/- distributive with PNA Hyperglycemia, resolved.  Not requiring insulin recently.  A1c 4.7 on 05/24/2019. Pseudomonal/serratia pna-completed  course of abx  Assessment & Plan:   VF Arrest, out of hospital due to ACS -approx 35 minute downtime before ROSC -post TTM  Severe CAD  -s/p DES to Mid LAD and PTCA to distal LAD lesion 8/27  PAF  -did not tolerated amiodarone d/t bradycardia   Plan: continue dual platelet therapy for at least 12 mon. On brilinta. Continue metoprolol 25mg  bid   Acute Encephalopathy Suspected Anoxic Brain Injury + Multifocal Strokes (on MRI) in setting of cardiac arrest Impression: toxic metabolic encephalopathy vs cephalosporin induced encephalopathy vs seizure.  Plan -Keppra, Vimpat -will continue discussion with family today. -spoke to palliative care--will likely be able to come by tomorrow  Acute Hypoxic Respiratory Failure. Mental status barrier to extubation. FiO2 21%, PEEP 5.  Plan Continue full vent support. Can do SBTs as seen appropriate. ETT day #14  ESRD with hyperphosphatemia.  Nephrology following. -on HD at home  -received CRRT 8/28-9/2. Now on intermittent dialysis Plan: MWF schedule  Anemia due to ESRD and chronic disease -chronic disease + superimposed acute illness  Plan -Continue to monitor -Transfuse for hemoglobin < 7 unless active bleeding with hemodynamic instability or hemoglobin < 8 if active cardiac ischemia.   HIV. Plan: on Biktarvy  Hepatitis B. Plan: Continue adjusted PTA regimen  At Risk Malnutrition  P: -Continue tube feeds  Best practice:  Diet: TF Pain/Anxiety/Delirium protocol (if indicated): Minimize sedating medications for neuro exam VAP protocol (if indicated): In place  DVT prophylaxis: SQ Heparin  GI prophylaxis: PPI  Mobility: BR  Code Status: Full Code  Family Communication: see note on goals of care discussion from 9/9. Patient's brother, [name], is primary contact. Sister, Vaughan Basta, noted that we can go through [place] for contact. Disposition: ICU   Mitzi Hansen, MD Internal Medicine Resident PGY--1  06/01/19 7:20 AM

## 2019-06-01 NOTE — Plan of Care (Signed)
  Problem: Clinical Measurements: Goal: Ability to maintain clinical measurements within normal limits will improve Outcome: Progressing Goal: Diagnostic test results will improve Outcome: Progressing Goal: Cardiovascular complication will be avoided Outcome: Progressing   Problem: Nutrition: Goal: Adequate nutrition will be maintained Outcome: Progressing   Problem: Safety: Goal: Ability to remain free from injury will improve Outcome: Progressing   Problem: Skin Integrity: Goal: Risk for impaired skin integrity will decrease Outcome: Progressing

## 2019-06-02 ENCOUNTER — Encounter: Payer: Medicare Other | Admitting: Infectious Diseases

## 2019-06-02 DIAGNOSIS — G931 Anoxic brain damage, not elsewhere classified: Secondary | ICD-10-CM | POA: Diagnosis present

## 2019-06-02 DIAGNOSIS — Z515 Encounter for palliative care: Secondary | ICD-10-CM | POA: Insufficient documentation

## 2019-06-02 LAB — RENAL FUNCTION PANEL
Albumin: 2.3 g/dL — ABNORMAL LOW (ref 3.5–5.0)
Anion gap: 16 — ABNORMAL HIGH (ref 5–15)
BUN: 93 mg/dL — ABNORMAL HIGH (ref 8–23)
CO2: 24 mmol/L (ref 22–32)
Calcium: 8.7 mg/dL — ABNORMAL LOW (ref 8.9–10.3)
Chloride: 96 mmol/L — ABNORMAL LOW (ref 98–111)
Creatinine, Ser: 8.44 mg/dL — ABNORMAL HIGH (ref 0.61–1.24)
GFR calc Af Amer: 7 mL/min — ABNORMAL LOW (ref >60)
GFR calc non Af Amer: 6 mL/min — ABNORMAL LOW (ref >60)
Glucose, Bld: 111 mg/dL — ABNORMAL HIGH (ref 70–99)
Phosphorus: 3.7 mg/dL (ref 2.5–4.6)
Potassium: 4.2 mmol/L (ref 3.5–5.1)
Sodium: 136 mmol/L (ref 135–145)

## 2019-06-02 LAB — GLUCOSE, CAPILLARY
Glucose-Capillary: 105 mg/dL — ABNORMAL HIGH (ref 70–99)
Glucose-Capillary: 95 mg/dL (ref 70–99)

## 2019-06-02 LAB — TSH: TSH: 3.867 u[IU]/mL (ref 0.350–4.500)

## 2019-06-02 LAB — MAGNESIUM: Magnesium: 1.8 mg/dL (ref 1.7–2.4)

## 2019-06-02 MED ORDER — FENTANYL CITRATE (PF) 100 MCG/2ML IJ SOLN
100.0000 ug | INTRAMUSCULAR | Status: DC | PRN
  Administered 2019-06-04: 100 ug via INTRAVENOUS
  Filled 2019-06-02 (×2): qty 2

## 2019-06-02 MED ORDER — ACETAMINOPHEN 650 MG RE SUPP: 650.0000 mg | Freq: Four times a day (QID) | RECTAL | Status: DC | PRN

## 2019-06-02 MED ORDER — HALOPERIDOL LACTATE 2 MG/ML PO CONC
0.5000 mg | ORAL | Status: DC | PRN
  Filled 2019-06-02: qty 0.3

## 2019-06-02 MED ORDER — ONDANSETRON HCL 4 MG/2ML IJ SOLN: 4.0000 mg | Freq: Four times a day (QID) | INTRAMUSCULAR | Status: DC | PRN

## 2019-06-02 MED ORDER — HALOPERIDOL 0.5 MG PO TABS
0.5000 mg | ORAL_TABLET | ORAL | Status: DC | PRN
  Filled 2019-06-02: qty 1

## 2019-06-02 MED ORDER — FENTANYL 2500MCG IN NS 250ML (10MCG/ML) PREMIX INFUSION
200.0000 ug/h | INTRAVENOUS | Status: DC
  Administered 2019-06-02: 25 ug/h via INTRAVENOUS
  Administered 2019-06-03: 02:00:00 150 ug/h via INTRAVENOUS
  Administered 2019-06-03 – 2019-06-04 (×2): 250 ug/h via INTRAVENOUS
  Filled 2019-06-02 (×5): qty 250

## 2019-06-02 MED ORDER — HALOPERIDOL LACTATE 5 MG/ML IJ SOLN: 0.5000 mg | INTRAMUSCULAR | Status: DC | PRN

## 2019-06-02 MED ORDER — ACETAMINOPHEN 325 MG PO TABS: 650.0000 mg | ORAL_TABLET | Freq: Four times a day (QID) | ORAL | Status: DC | PRN

## 2019-06-02 MED ORDER — DEXMEDETOMIDINE HCL IN NACL 200 MCG/50ML IV SOLN
0.2000 ug/kg/h | INTRAVENOUS | Status: DC
  Administered 2019-06-02 (×3): 0.4 ug/kg/h via INTRAVENOUS
  Filled 2019-06-02 (×3): qty 50

## 2019-06-02 MED ORDER — GLYCOPYRROLATE 0.2 MG/ML IJ SOLN: 0.2000 mg | INTRAMUSCULAR | Status: DC | PRN

## 2019-06-02 MED ORDER — FENTANYL CITRATE (PF) 100 MCG/2ML IJ SOLN: 50.0000 ug | INTRAMUSCULAR | Status: DC | PRN

## 2019-06-02 MED ORDER — LORAZEPAM 2 MG/ML IJ SOLN
2.0000 mg | INTRAMUSCULAR | Status: DC | PRN
  Administered 2019-06-02: 14:00:00 2 mg via INTRAVENOUS

## 2019-06-02 MED ORDER — LORAZEPAM 2 MG/ML IJ SOLN
INTRAMUSCULAR | Status: AC
  Administered 2019-06-02: 14:00:00 2 mg via INTRAVENOUS
  Filled 2019-06-02: qty 1

## 2019-06-02 MED ORDER — GLYCOPYRROLATE 1 MG PO TABS
1.0000 mg | ORAL_TABLET | ORAL | Status: DC | PRN
  Filled 2019-06-02: qty 1

## 2019-06-02 MED ORDER — ONDANSETRON 4 MG PO TBDP
4.0000 mg | ORAL_TABLET | Freq: Four times a day (QID) | ORAL | Status: DC | PRN
  Filled 2019-06-02: qty 1

## 2019-06-02 MED ORDER — LORAZEPAM 2 MG/ML IJ SOLN
2.0000 mg | INTRAMUSCULAR | Status: DC
  Administered 2019-06-02 – 2019-06-04 (×5): 2 mg via INTRAVENOUS
  Filled 2019-06-02 (×6): qty 1

## 2019-06-02 NOTE — Progress Notes (Signed)
Pt transferred from ICU to 6 [address] 23, palliative,he can open his eyes flat affect with left IJ double lumen with fentanyl drip at 15 mcg/hr, , abrasion on his forehead, family at the bedside, left AV shunt, no s/s of distress noted.

## 2019-06-02 NOTE — Progress Notes (Signed)
Family arrived after noon and made the decision to w/d care; made comfort care.  Pt was extubated and remained on Fentanyl gtt, resting comfortably.  With stimulus pt will open eyes but does not track.  Family remained at bedside. 1742 report was given to Tess, RN for room 23 on 6N and we transported the patient.  He had no personal belongings.

## 2019-06-02 NOTE — Progress Notes (Signed)
Daily Progress Note   Patient Name: Jeffrey [name]       Date: 06/02/2019 DOB: [birth year]  Age: 63 y.o. MRN#: [id number] Attending Physician: Collene Gobble, MD Primary Care Physician: Velna Ochs, MD Admit Date: 05/04/2019  Reason for Consultation/Follow-up: Establishing goals of care and Withdrawal of life-sustaining treatment  Subjective: Met with patient's brother and sister again today. Both have agreed to extubated to comfort and transition patient to comfort measures only. I answered their questions and gave emotional support. Remained at bedside while patient was extubated- he did require frequent boluses of fentanyl and titration of infusion for comfort.   Review of Systems  Unable to perform ROS: Intubated    Length of Stay: 15  Current Medications: Scheduled Meds:    Continuous Infusions:  fentaNYL infusion INTRAVENOUS 25 mcg/hr (06/02/19 0700)    PRN Meds: acetaminophen **OR** acetaminophen, fentaNYL (SUBLIMAZE) injection, glycopyrrolate **OR** glycopyrrolate **OR** glycopyrrolate, haloperidol **OR** haloperidol **OR** haloperidol lactate, LORazepam, ondansetron **OR** ondansetron (ZOFRAN) IV  Physical Exam Vitals signs and nursing note reviewed.  Constitutional:      Appearance: He is ill-appearing.     Comments: Diffuse anasarca  HENT:     Head: Normocephalic and atraumatic.  Skin:    General: Skin is warm and dry.  Neurological:     Comments: Responds to noxious stimuli only             Vital Signs: BP 98/66    Pulse 60    Temp 98.2 F (36.8 C) (Oral)    Resp 14    Ht 6' (1.829 m)    Wt 88.6 kg    SpO2 95%    BMI 26.49 kg/m  SpO2: SpO2: 95 % O2 Device: O2 Device: Ventilator O2 Flow Rate: O2 Flow Rate (L/min): 40 L/min  Intake/output summary:    Intake/Output Summary (Last 24 hours) at 06/02/2019 1328 Last data filed at 06/02/2019 0700 Gross per 24 hour  Intake 1267.12 ml  Output --  Net 1267.12 ml   LBM: Last BM Date: 06/01/19 Baseline Weight: Weight: 87 kg Most recent weight: Weight: 88.6 kg       Palliative Assessment/Data: PPS: 10%      Patient Active Problem List   Diagnosis Date Noted   Advanced care planning/counseling discussion    Goals of care, counseling/discussion  Palliative care by specialist    Pressure injury of skin 05/24/2019   Cerebral embolism with cerebral infarction 05/24/2019   Acute respiratory failure (HCC)    Shock circulatory (HCC)    High anion gap metabolic acidosis    Cardiac arrest ([place]) 05/22/2019   Acute anterior wall MI ([place])    Arthritis of left shoulder region 03/16/2019   Pleural effusion on left 11/11/2018   S/P Left VATS with Drainage of Pleural Effusion, Decortication of Visceral/Parietal Pleura 11/04/2018   Physical deconditioning 09/16/2018   S/P aortic valve replacement with bioprosthetic valve 07/28/2018   Severe aortic insufficiency 07/28/2018   Cerebral thrombosis with cerebral infarction 07/13/2018   Seizure disorder ([place]) 07/10/2018   Status epilepticus ([place]) 07/10/2018   Altered mental status 07/09/2018   Hypertensive urgency 07/04/2018   OSA (obstructive sleep apnea) 05/31/2018   Onychomycosis of right great toe 05/09/2018   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    Left shoulder pain    Left ventricular noncompaction cardiomyopathy (HCC)    Coagulase negative Staphylococcus bacteremia    Hypervolemia    ESRD on hemodialysis ([place])    Asymptomatic HIV infection ([place])    CAD (coronary artery disease) 04/19/2018   Dyspnea 04/10/2018   Loud snoring 04/08/2018   Upper GI bleed 04/08/2018   NSTEMI (non-ST elevated myocardial infarction) ([place]) 03/15/2018   PRES (posterior reversible encephalopathy syndrome)  03/14/2018   HTN (hypertension), malignant 03/14/2018   Malnutrition of moderate degree 02/23/2018   Seizure ([place]) 02/22/2018   Mass of duodenum    Antibiotic long-term use    Septic arthritis ([place]) 09/15/2017   Encounter for incision and drainage procedure 09/15/2017   Effusion of left knee 09/08/2017   Intertrigo 07/09/2017   AC joint arthropathy 06/16/2017   Pseudoaneurysm of arteriovenous dialysis fistula ([place]) 12/23/2016   Eczema 12/10/2016   Bilateral low back pain without sciatica    Aortic valve insufficiency    Fall 09/08/2016   Knee abrasion, right, initial encounter 09/08/2016   Lumbosacral spondylosis without myelopathy 08/03/2016   Pulmonary nodules 08/03/2016   Essential hypertension    Anemia of chronic disease 04/08/2016   Secondary hyperparathyroidism ([place]) 02/09/2014   Asthma 02/09/2014   Nuclear sclerotic cataract of both eyes 12/12/2013   Pruritus 10/26/2013   Myopia with presbyopia of both eyes 10/26/2013   COPD (chronic obstructive pulmonary disease) ([place]) 06/27/2013   Thrombocytopenia ([place]) 01/09/2013   HBV (hepatitis B virus) infection 01/09/2013   Anxiety 07/22/2011   HIV disease ([place]) 06/17/2011    Palliative Care Assessment & Plan   Patient Profile: 63 y.o. male  with past medical history of ESRD on HD at home, CABG and valve replacement, HIV, Hep B, OSA, Epilepsy, COPD, CAD, spodylosis admitted on 05/13/2019 with STEMI. He was found down by his brother in his home- EMS administered CPR for 35 minutes during which he was cardioverted 5 times noted to be in Vfib. He underwent cooling protocol and cardiac cath with placement of medication eluding stent in the LAD which was occluded 100%. He has recovered from cardiogenic shock, however, has remained encephalopathic likely due to anoxic brain injury and embolic CVA's during V. fib arrest and possible cefepime toxicity. EEG showed G peds (likely due to anoxic injury- and per  research indicates poor prognosis for neurologic recovery). Attempts to burst suppress with propofol were made, however, G peds returned after sedation discontinued. Clinically, patient has not improved- remains responsive to noxious stimuli only, does not follow commands. Per RN- today when  put on pressure support he was not able to tolerate it with quickly increasing RR. He has been treated for pseudomonas and serratia in tracheal aspirate. CBC shows trending WBC, and low grade temp max 100.6- possibly indicating onset of new infection. He is currently on his regular HD schedule. Critical care team met with patient's siblings- [name] and Vaughan Basta to discuss [place]. [name] indicated patient would not trach/PEG and long term care, however sister Vaughan Basta was in disagreement. Palliative medicine consulted to assist with further [place].   Assessment/Recommendations/Plan   Extubated to comfort measures only  Artificial nutrition and hydration stopped  No further dialysis  Fentanyl titrated up to 151mg/hr based on boluses required for comfort during extubation  Lorazepam 2109mq4hr for agitation and seizures  Robinul 56m37mIV q4hr prn for secretions  Goals of Care and Additional Recommendations:  Limitations on Scope of Treatment: Full Comfort Care  Code Status:  DNR  Prognosis:   Hours - Days  Discharge Planning:  Anticipated Hospital Death  Care plan was discussed with patient's brother and sister and RN.  Thank you for allowing the Palliative Medicine Team to assist in the care of this patient.   Time In: 1230 Time Out: 1400 Total Time 90 minutes Prolonged Time Billed yes      Greater than 50%  of this time was spent counseling and coordinating care related to the above assessment and plan.  KasMariana KaufmanGNP-C Palliative Medicine   Please contact Palliative Medicine Team phone at [phone] questions and concerns.

## 2019-06-02 NOTE — Progress Notes (Signed)
Patient having episodes of severe coughing and gagging on the vent.  Suctioned multiple times for minimal amounts of secretions.  Notified Dr. Lucile Shutters of concerns at [place] via [facility].  Patient not on any sedation, increasing agitation, and restlessness with episodes of severe coughing/gagging.  Had Abigail Butts, RRT to check ETT tube to confirm placement. Received orders for precedex and fentanyl drips.  Monitoring results.

## 2019-06-02 NOTE — Progress Notes (Signed)
[name] Progress Note Patient Name: Jeffrey [name] DOB: March 17, 1956 MRN: [id number]   Date of Service  06/02/2019  HPI/Events of Note  Pt is agitated and having coughing spells, he almost coughed the tube out earlier today. Per RN he is awaiting family arrival to transition to comfort care.  eICU Interventions  Will put patient on low level Precedex and a fentanyl infusion for comfort pending the arrival of his family.        [place]wo U Ogan 06/02/2019, 1:20 AM

## 2019-06-02 NOTE — Progress Notes (Signed)
Patient much more relaxed.  Decreased episodes of coughing and gagging noted.  Remains on low dose fentanyl and precedex at this time.

## 2019-06-02 NOTE — Progress Notes (Signed)
Patient extubated per family request for withdrawal of care.  Palliative MD present during extubation.  Patient looks comfortable at this time with no distress noted.  No complications at this time.

## 2019-06-02 NOTE — Progress Notes (Signed)
RT advanced pt ETT from 23 to 25 at normal placement. Bilat BS were auscultated in all fields, pt VT appropriate. Pt coughed tube out of position. Pt tolerated advancement well. RT will continue to monitor.

## 2019-06-02 NOTE — Progress Notes (Signed)
Patient ID: Jeffrey [name], male   DOB: Jul 17, 1956, 63 y.o.   MRN: [id number]  [facility] Progress Note   Assessment/ Plan:   1.  Acute hypoxic respiratory failure status post V. fib cardiac arrest: Secondary to LAD STEMI now status post DES to LAD with a background history of bioprosthetic aortic valve replacement.  Remains ventilator dependent without any ability to wean due to encephalopathy. 2. ESRD: Transitioned from CRRT to intermittent hemodialysis with original plans to undertake dialysis today to continue usual outpatient schedule however, family planning on withdrawing care starting today (appropriately for circumstances). 3. Anemia: Overnight stable hemoglobin/hematocrit without overt loss, monitor with serial labs. 4. CKD-MBD: Calcium and phosphorus level within acceptable range on hemodialysis/chronic nutritional support. 5. Nutrition: Continue enteral nutritional support per CCM. 6.  Encephalopathy: Metabolic and likely secondary to anoxic injury from V. fib arrest versus cefepime induced per neurology evaluation; poor overall outlook with limited chances for meaningful recovery.  Subjective:   Events from overnight noted-appears from palliative care note that family intends to withdraw care starting today.   Objective:   BP 132/82   Pulse 70   Temp 98.9 F (37.2 C) (Oral)   Resp 14   Ht 6' (1.829 m)   Wt 88.6 kg   SpO2 100%   BMI 26.49 kg/m   Physical Exam: Gen: Somnolent, unresponsive, does not withdraw to pain CVS: Pulse regular rhythm, normal rate, S1 and S2 normal Resp: Anteriorly clear to auscultation, no distinct rales or rhonchi Abd: Soft, obese, nontender Ext: Trace edema over lower extremities.  Left RCF with intact dressings.  Labs: BMET Recent Labs  Lab 05/27/19 0416 05/28/19 0419 05/29/19 0346 05/30/19 0432 05/31/19 0311 06/01/19 0302 06/02/19 0210  NA 137 138 139 136 136 136 136  K 4.3 4.7 4.8 4.0 3.8 3.8 4.2  CL 101 101 100 96*  96* 97* 96*  CO2 20* 20* 20* '25 23 25 24  ' GLUCOSE 95 96 104* 105* 109* 118* 111*  BUN 66* 109* 132* 85* 114* 70* 93*  CREATININE 5.60* 7.87* 9.67* 6.41* 8.38* 6.13* 8.44*  CALCIUM 8.3* 8.7* 8.7* 8.7* 8.8* 8.9 8.7*  PHOS 4.0 5.1* 5.2* 4.2 4.5 3.5 3.7   CBC Recent Labs  Lab 05/29/19 0346 05/30/19 0432 05/31/19 0751 06/01/19 0306  WBC 10.2 12.1* 11.3* 11.6*  NEUTROABS  --   --  9.4*  --   HGB 7.0* 7.4* 7.0* 7.4*  HCT 22.7* 24.0* 22.8* 24.3*  MCV 99.6 97.2 98.3 99.2  PLT 228 292 326 334   Medications:    . aspirin  81 mg Per Tube Daily  . atorvastatin  40 mg Per Tube q1800  . B-complex with vitamin C  1 tablet Per Tube Daily  . chlorhexidine gluconate (MEDLINE KIT)  15 mL Mouth Rinse BID  . Chlorhexidine Gluconate Cloth  6 each Topical Q0600  . darbepoetin (ARANESP) injection - DIALYSIS  60 mcg Intravenous Q Mon-HD  . emtricitabine-tenofovir AF  1 tablet Per Tube Daily   And  . dolutegravir  50 mg Oral Daily  . feeding supplement (PRO-STAT SUGAR FREE 64)  30 mL Per Tube TID  . heparin injection (subcutaneous)  5,000 Units Subcutaneous Q8H  . [START ON 06/05/2019] influenza vaccine adjuvanted  0.5 mL Intramuscular Tomorrow-1000  . insulin aspart  1-3 Units Subcutaneous BID WC  . mouth rinse  15 mL Mouth Rinse 10 times per day  . metoprolol tartrate  25 mg Per Tube BID  . pantoprazole sodium  40 mg Per Tube QHS  . ticagrelor  90 mg Per Tube BID  . white petrolatum   Topical BID   Elmarie Shiley, MD 06/02/2019, 7:57 AM

## 2019-06-02 NOTE — Progress Notes (Addendum)
NAME:  Jeffrey [name], MRN:  [id number], DOB:  1956-03-09, LOS: 36 ADMISSION DATE:  05/20/2019, CONSULTATION DATE:  8/27  REFERRING MD: 8/27, CHIEF COMPLAINT: Cardiac arrest  Brief History   63 year old M admitted to [facility] on 8/27 status post V. fib arrest with an estimated downtime of 35 minutes before ROSC.  Completed TTM protocol.    Past Medical History  HIV Diffuse nonobstructive CAD Hepatitis B ESRD on home HD Hypothyroidism Peripheral vascular disease Prior GI bleed Aortic valve insufficiency s/p aortic valve endocarditis requiring replacement in 2019 Left VATS for fibrothorax following bacteremia in 10/2018  Significant Hospital Events   8/27  Admit with VF arrest, ~35 minute downtime 8/28  Re-warming, pressor dependent, EEG neg for seizure, amio stopped d/t brady, HD  8/29  Off Levophed, rewarmed 0330, off sedation  8/30  Heparin gtt initiated with EKG changes concerning for ischemia  8/31  Heparin gtt stopped  9/02  CVVHD stopped    Consults:  Cardiology Neurology Nephrology Palliative  Procedures:  ETT 8/27 >>  L IJ CVL 8/27 >>  L Femoral HD 8/28 >> remove on 9/7  Significant Diagnostic Tests:  ECHO 8/27 >> LVEF 45 to 50% with left ventricular wall dyskinesis, and AF at time of exam EEG 8/27 >> negative for seizure LHC 8/28 >> mid LAD lesion 100% stenosis, drug-eluting stent placed, distal apical LAD lesion stenosis 80% treated with PTCA, nonobstructive disease involving the circumflex and RCA EEG 8/30 >> frequent sharply contoured generalized discharges with triphasic morphology.  Irritability versus cefepime neurotoxicity CT Head 8/31 >> small vessel white matter disease MRI 9/1 >> patchy multifocal acute ischemic infarcts involving the bilateral cerebral and cerebellar hemispheres-suspicious for central thromboembolic etiology.  Petechial hemorrhage about 1 of these infarcts of the right parietal cortex.  Underlying atrophy with chronic microvascular disease EEG  9/3 >> improved triphasic waves following initiation of antiseizure medications EEG 9/4 >> return of triphasic waves with propofol wean   Micro Data:  COVID 8/27 >> negative  Tracheal aspirate 8/28 >> moderate pseudomonas aeruginosa >> S-ceftaz           Serratia marcescens >> R- cefazolin, S-ceftaz Antimicrobials:  Cefepime 8/29 >> 9/2 Ceftazidime 9/2 >> 9/5  Interim history/subjective:  Palliative care consulted yesterday. Discussed goals of care. Per palliative care: Patient's brother is ready to transition to comfort care. Patient's sister does not want to be the one to make that decision but does agree that Jeffrey [name] would not want trach/peg. She notes that she has to fly back to [place] on Saturday and wishes to be here during the transition to comfort care, therefore making Friday (today), the necessary transition day. Patient was moved to DNR. PCCM notes overnight also indicate that patient developed coughing overnight, to the extent that he almost coughed the tube out. Over night PCCM placed him on precedex and fentanyl infusion for comfort and in anticipation for transition to comfort cares.  Objective   Blood pressure 132/82, pulse 70, temperature 98.2 F (36.8 C), temperature source Oral, resp. rate 14, height 6' (1.829 m), weight 88.6 kg, SpO2 100 %.    Vent Mode: PRVC FiO2 (%):  [21 %] 21 % Set Rate:  [14 bmp] 14 bmp Vt Set:  [620 mL] 620 mL PEEP:  [5 cmH20] 5 cmH20 Plateau Pressure:  [10 cmH20-23 cmH20] 17 cmH20   Intake/Output Summary (Last 24 hours) at 06/02/2019 0823 Last data filed at 06/02/2019 0700 Gross per 24 hour  Intake 1622.55 ml  Output -  Net 1622.55 ml   Filed Weights   05/31/19 0317 06/01/19 0500 06/02/19 0500  Weight: 87.5 kg 87.2 kg 88.6 kg    Examination: General: Chronically ill-appearing man lying in bed in no acute distress HENT: Normocephalic, atraumatic, large protruding tongue unchanged.  Mucous membranes moist. Eyes: Anicteric, no  scleral icteris. Neuro: Eyes do not spontaneously open.  No response to verbal stimulation.  Grimace and withdraw of RUE to noxious stimuli. Positive gag, cough, corneal, pupillary reflexes. Downward gaze.  CV: Regular rate and rhythm, systolic murmur at the apex. PULM:  Clear to auscultation bilaterally. On ventilator GI: Soft, nontender, nondistended.  Hypoactive bowel sounds. Extremities: Warm.  Skin: No rashes  Resolved Hospital Problem list   Shock - initial component cardiogenic +/- distributive with PNA Hyperglycemia, resolved.  Not requiring insulin recently.  A1c 4.7 on 05/24/2019. Pseudomonal/serratia pna-completed course of abx  Assessment & Plan:  Plan to transition to comfort measures following family's arrival today. Palliative care on board.  VF Arrest, out of hospital due to ACS -approx 35 minute downtime before ROSC -post TTM  Severe CAD  -s/p DES to Mid LAD and PTCA to distal LAD lesion 8/27  PAF  -did not tolerated amiodarone d/t bradycardia   Plan: continue dual platelet therapy for at least 12 mon. On brilinta. Continue metoprolol 25mg  bid   Acute Encephalopathy Suspected Anoxic Brain Injury + Multifocal Strokes (on MRI) in setting of cardiac arrest Impression: toxic metabolic encephalopathy vs cephalosporin induced encephalopathy vs seizure. Poor prognosis Plan -Keppra, Vimpat -plan to transition to comfort measures today   Acute Hypoxic Respiratory Failure. Mental status barrier to extubation.  Plan Continue full vent support for now but plan to transition to comfort measures today.  ETT day #15  ESRD with hyperphosphatemia.  Will hold off on dialysis in anticipation of transition to comfort measures.  Anemia due to ESRD and chronic disease -chronic disease + superimposed acute illness  Plan -Continue to monitor -Transfuse for hemoglobin < 7 unless active bleeding with hemodynamic instability or hemoglobin < 8 if active cardiac ischemia.   HIV. Plan:  on Biktarvy  Hepatitis B. Plan: Continue adjusted PTA regimen  At Risk Malnutrition  P: -Continue tube feeds  Best practice:  Diet: TF Pain/Anxiety/Delirium protocol (if indicated): Minimize sedating medications for neuro exam VAP protocol (if indicated): In place  DVT prophylaxis: SQ Heparin  GI prophylaxis: PPI  Mobility: BR  Code Status: DNR Code  Family Communication: plan to transfer to comfort care today. See palliative care note from 9/10 Disposition: ICU   Mitzi Hansen, MD Internal Medicine Resident PGY--1  06/02/19 8:23 AM   Attending Note:  I have examined patient, reviewed labs, studies and notes.   63 year old man with HIV, CAD, end-stage renal disease on hemodialysis.  He experienced a VF arrest, prolonged CPR and underwent therapeutic hypothermia.  Unfortunately he is continued to have multifactorial encephalopathy due to multiple strokes, hypoxemia, possible cephalosporin toxicity.  He has not shown any significant neurological improvement.  No significant neuro changes reported over the last 24 hours  Vitals:   06/02/19 1000 06/02/19 1132 06/02/19 1200 06/02/19 1300  BP: 117/79 122/73 116/72 98/66  Pulse: 62 62 62 60  Resp: 14 14 14 14   Temp:      TempSrc:      SpO2: 99% 98% 98% 95%  Weight:      Height:      Exam is unchanged compared with 9/10  Discussions undertaken with the patient's  family and greatly appreciate the input and extension from palliative care service.  Based on the patient's wishes and his overall prognosis for meaningful recovery the recommendation has been made for a transition to comfort and compassionate extubation.  The patient's brother is more comfortable with this plan than his sister although she does not want to be a barrier to following the patient's wishes.  Plan is in place for compassionate withdrawal of care today 9/11.   Independent critical care time is 32 minutes.   Baltazar Apo, MD, PhD 06/02/2019, 1:49 PM [place]  [facility] [phone] or if no answer [phone]

## 2019-06-03 MED ORDER — SCOPOLAMINE 1 MG/3DAYS TD PT72
1.0000 | MEDICATED_PATCH | TRANSDERMAL | Status: DC
  Administered 2019-06-03: 1.5 mg via TRANSDERMAL
  Filled 2019-06-03: qty 1

## 2019-06-03 MED ORDER — CHLORHEXIDINE GLUCONATE 0.12 % MT SOLN
15.0000 mL | Freq: Two times a day (BID) | OROMUCOSAL | Status: DC
  Administered 2019-06-03 (×2): 15 mL via OROMUCOSAL
  Filled 2019-06-03 (×2): qty 15

## 2019-06-03 MED ORDER — GLYCOPYRROLATE 0.2 MG/ML IJ SOLN
0.2000 mg | INTRAMUSCULAR | Status: DC
  Administered 2019-06-03 – 2019-06-04 (×5): 0.2 mg via INTRAVENOUS
  Filled 2019-06-03 (×5): qty 1

## 2019-06-03 MED ORDER — ORAL CARE MOUTH RINSE
15.0000 mL | Freq: Two times a day (BID) | OROMUCOSAL | Status: DC
  Administered 2019-06-03 (×2): 15 mL via OROMUCOSAL

## 2019-06-03 MED ORDER — LORAZEPAM 2 MG/ML IJ SOLN
2.0000 mg | INTRAMUSCULAR | Status: AC
  Administered 2019-06-03: 2 mg via INTRAVENOUS
  Filled 2019-06-03: qty 1

## 2019-06-03 NOTE — Progress Notes (Signed)
NAME:  Jeffrey [name], MRN:  [id number], DOB:  November 18, 1955, LOS: 53 ADMISSION DATE:  05/11/2019, CONSULTATION DATE:  8/27  REFERRING MD: 8/27, CHIEF COMPLAINT: Cardiac arrest  Brief History   63 year old M admitted to [facility] on 8/27 status post V. fib arrest with an estimated downtime of 35 minutes before ROSC.  Completed TTM protocol.    Past Medical History  HIV Diffuse nonobstructive CAD Hepatitis B ESRD on home HD Hypothyroidism Peripheral vascular disease Prior GI bleed Aortic valve insufficiency s/p aortic valve endocarditis requiring replacement in 2019 Left VATS for fibrothorax following bacteremia in 10/2018  Significant Hospital Events   8/27  Admit with VF arrest, ~35 minute downtime 8/28  Re-warming, pressor dependent, EEG neg for seizure, amio stopped d/t brady, HD  8/29  Off Levophed, rewarmed 0330, off sedation  8/30  Heparin gtt initiated with EKG changes concerning for ischemia  8/31  Heparin gtt stopped  9/02  CVVHD stopped  06/02/2019 transition to floor for comfort care  Consults:  Cardiology Neurology Nephrology Palliative  Procedures:  ETT 8/27 >> out L IJ CVL 8/27 >>  L Femoral HD 8/28 >> remove on 9/7  Significant Diagnostic Tests:  ECHO 8/27 >> LVEF 45 to 50% with left ventricular wall dyskinesis, and AF at time of exam EEG 8/27 >> negative for seizure LHC 8/28 >> mid LAD lesion 100% stenosis, drug-eluting stent placed, distal apical LAD lesion stenosis 80% treated with PTCA, nonobstructive disease involving the circumflex and RCA EEG 8/30 >> frequent sharply contoured generalized discharges with triphasic morphology.  Irritability versus cefepime neurotoxicity CT Head 8/31 >> small vessel white matter disease MRI 9/1 >> patchy multifocal acute ischemic infarcts involving the bilateral cerebral and cerebellar hemispheres-suspicious for central thromboembolic etiology.  Petechial hemorrhage about 1 of these infarcts of the right parietal cortex.   Underlying atrophy with chronic microvascular disease EEG 9/3 >> improved triphasic waves following initiation of antiseizure medications EEG 9/4 >> return of triphasic waves with propofol wean   Micro Data:  COVID 8/27 >> negative  Tracheal aspirate 8/28 >> moderate pseudomonas aeruginosa >> S-ceftaz           Serratia marcescens >> R- cefazolin, S-ceftaz Antimicrobials:  Cefepime 8/29 >> 9/2 Ceftazidime 9/2 >> 9/5  Interim history/subjective:  Palliative care consulted yesterday. Discussed goals of care. Per palliative care: Patient's brother is ready to transition to comfort care. Patient's sister does not want to be the one to make that decision but does agree that Jeffrey [name] would not want trach/peg. She notes that she has to fly back to [place] on Saturday and wishes to be here during the transition to comfort care, therefore making Friday (today), the necessary transition day. Patient was moved to DNR. PCCM notes overnight also indicate that patient developed coughing overnight, to the extent that he almost coughed the tube out. Over night PCCM placed him on precedex and fentanyl infusion for comfort and in anticipation for transition to comfort cares.  Objective   Blood pressure (!) 167/94, pulse 75, temperature 99.2 F (37.3 C), temperature source Oral, resp. rate 18, height 6' (1.829 m), weight 88.6 kg, SpO2 100 %.        Intake/Output Summary (Last 24 hours) at 06/03/2019 1244 Last data filed at 06/03/2019 0754 Gross per 24 hour  Intake 596.06 ml  Output 0 ml  Net 596.06 ml   Filed Weights   05/31/19 0317 06/01/19 0500 06/02/19 0500  Weight: 87.5 kg 87.2 kg 88.6 kg  Examination: General: Male currently on full comfort care HEENT: No JVD  Neuro: Heavily sedated CV: Distant m/r/g PULM: Agonal GI: soft, bsx4 active  Extremities: warm/dry, 1+ edema  Skin: no rashes or lesions   Resolved Hospital Problem list   Shock - initial component cardiogenic +/-  distributive with PNA Hyperglycemia, resolved.  Not requiring insulin recently.  A1c 4.7 on 05/24/2019. Pseudomonal/serratia pna-completed course of abx  Assessment & Plan:  Plan to transition to comfort measures following family's arrival today. Palliative care on board.  VF Arrest, out of hospital due to ACS History of comfort care currently on the floor fentanyl drip 250 mcg an hour poorly responsive Severe CAD  Full comfort care PAF   Plan: Full comfort  Acute Encephalopathy Suspected Anoxic Brain Injury + Multifocal Strokes (on MRI) in setting of cardiac arrest Impression: toxic metabolic encephalopathy vs cephalosporin induced encephalopathy vs seizure. Poor prognosis Plan Full comfort care   Acute Hypoxic Respiratory Failure. Mental status barrier to extubation.  Plan Continue full vent support for now but plan to transition to comfort measures today.  ETT day #15  ESRD with hyperphosphatemia.  Will hold off on dialysis in anticipation of transition to comfort measures.  Anemia due to ESRD and chronic disease Full comfort care Plan Full comfort care  HIV. Plan: Full comfort care Hepatitis B. Plan: Full comfort care  At Risk Malnutrition  P: Full comfort care with fentanyl drip  Best practice:  Diet: TF Pain/Anxiety/Delirium protocol currently on fentanyl drip for comfort  VAP protocol (if indicated): In place  DVT prophylaxis: SQ Heparin  GI prophylaxis: PPI  Mobility: BR  Code Status: DNR Code  Family Communication: plan to transfer to comfort care today. See palliative care note from 9/10 Disposition: Floor for comfort care

## 2019-06-03 NOTE — Progress Notes (Signed)
                                                                                                                                                                                                         Daily Progress Note   Patient Name: Jeffrey [name]       Date: 06/03/2019 DOB: 10/10/1955  Age: 63 y.o. MRN#: [id number] Attending Physician: Byrum, Robert S, MD Primary Care Physician: Guilloud, Carolyn, MD Admit Date: 05/16/2019  Reason for Consultation/Follow-up: Terminal Care and Withdrawal of life-sustaining treatment  Subjective: Patient in bed. Rapid breathing, copious secretions. Does not appear comfortable.  Noted lorazepam held last night.   Review of Systems  Unable to perform ROS: Acuity of condition    Length of Stay: 16  Current Medications: Scheduled Meds:  . LORazepam  2 mg Intravenous Q4H  . LORazepam  2 mg Intravenous NOW    Continuous Infusions: . fentaNYL infusion INTRAVENOUS 150 mcg/hr (06/03/19 0156)    PRN Meds: acetaminophen **OR** acetaminophen, fentaNYL (SUBLIMAZE) injection, glycopyrrolate **OR** glycopyrrolate **OR** glycopyrrolate, haloperidol **OR** haloperidol **OR** haloperidol lactate, ondansetron **OR** ondansetron (ZOFRAN) IV  Physical Exam Vitals signs and nursing note reviewed.  Constitutional:      Appearance: He is ill-appearing.  Cardiovascular:     Rate and Rhythm: Normal rate.     Pulses: Normal pulses.  Pulmonary:     Breath sounds: Rhonchi present.     Comments: Increased effort and rate Neurological:     Comments: nonresponsive             Vital Signs: BP (!) 167/94 (BP Location: Right Arm)   Pulse 75   Temp 99.2 F (37.3 C) (Oral)   Resp 18   Ht 6' (1.829 m)   Wt 88.6 kg   SpO2 100%   BMI 26.49 kg/m  SpO2: SpO2: 100 % O2 Device: O2 Device: Room Air O2 Flow Rate: O2 Flow Rate (L/min): 40 L/min  Intake/output summary:   Intake/Output Summary (Last 24 hours) at 06/03/2019 1046 Last data filed at 06/03/2019 0754  Gross per 24 hour  Intake 596.06 ml  Output 0 ml  Net 596.06 ml   LBM: Last BM Date: 06/01/19 Baseline Weight: Weight: 87 kg Most recent weight: Weight: 88.6 kg       Palliative Assessment/Data: PPS: 10%       Palliative Care Assessment & Plan   Patient Profile: 63 y.o.malewith past medical history of ESRD on HD at home, CABG and valve replacement, HIV, Hep B, OSA, Epilepsy, COPD, CAD, spodylosisadmitted on08/02/2020with STEMI. He was   found down by his brother in his home- EMS administered CPR for 35 minutes during which he was cardioverted 5 times noted to be in Vfib. He underwent cooling protocol and cardiac cath with placement of medication eluding stent in the LAD which was occluded 100%. He has recovered from cardiogenic shock, however, has remained encephalopathic likely due to anoxic brain injury and embolic CVA's during V. fib arrest and possible cefepime toxicity. EEG showed G peds (likely due to anoxic injury- and per research indicates poor prognosis for neurologic recovery). Attempts to burst suppress with propofol were made, however, G peds returned after sedation discontinued. Clinically, patient has not improved- remains responsive to noxious stimuli only, does not follow commands. Per RN- today when put on pressure support he was not able to tolerate it with quickly increasing RR. He has been treated for pseudomonas and serratia in tracheal aspirate. CBC shows trending WBC, and low grade temp max 100.6- possibly indicating onset of new infection. He is currently on his regular HD schedule. Critical care team met with patient's siblings- [name] and Linda to discuss GOC. [name] indicated patient would not trach/PEG and long term care, however sister Linda was in disagreement. Palliative medicine consulted to assist with further GOC.After multiple family discussions, both [name] and Linda came to decision together to extubate patient to comfort measures only.   Assessment/Recommendations/Plan   Increase Fentanyl infusion to 200mcg/hr  Encouraged RN to use fentanyl boluses  Scheduled Lorazepam 2mg q4hr  Add scopolamine patch  Will schedule robinul .2mg q4hr IV  Goals of Care and Additional Recommendations:  Limitations on Scope of Treatment: Full Comfort Care  Code Status:  DNR  Prognosis:   Hours - Days  Discharge Planning:  Anticipated Hospital Death  Care plan was discussed with patient's RN  Thank you for allowing the Palliative Medicine Team to assist in the care of this patient.   Time In: 1015 Time Out: 1105 Total Time 50 mins Prolonged Time Billed yes      Greater than 50%  of this time was spent counseling and coordinating care related to the above assessment and plan.   , AGNP-C Palliative Medicine   Please contact Palliative Medicine Team phone at [phone] for questions and concerns.        

## 2019-06-03 NOTE — Progress Notes (Signed)
Patient ID: Jeffrey [name], male   DOB: November 16, 1955, 63 y.o.   MRN: [id number] Hemodialysis discontinued yesterday after goals of care transition to comfort/palliative care. Renal service will sign off and remain available for questions or concerns.  Elmarie Shiley MD [facility]. Office # [phone] Pager # [phone] 9:55 AM

## 2019-06-03 NOTE — Plan of Care (Signed)
  Problem: Pain Managment: Goal: General experience of comfort will improve Outcome: Progressing   Problem: Safety: Goal: Ability to remain free from injury will improve Outcome: Progressing   Problem: Skin Integrity: Goal: Risk for impaired skin integrity will decrease Outcome: Progressing   

## 2019-06-06 ENCOUNTER — Ambulatory Visit: Payer: Medicare Other | Admitting: Podiatry

## 2019-06-06 ENCOUNTER — Encounter: Payer: Medicare Other | Admitting: Infectious Diseases

## 2019-06-07 ENCOUNTER — Telehealth: Payer: Self-pay

## 2019-06-07 NOTE — Telephone Encounter (Signed)
Received dc from [employer].  DC is for cremation and a patient of Doctor Byrum.   DC will be taken to Pulmonary Unit for signature.

## 2019-06-12 NOTE — Telephone Encounter (Signed)
06/12/2019 received signed DC back from Dr. Lamonte Sakai will call and fax copy to [employer]....PWR

## 2019-06-19 ENCOUNTER — Ambulatory Visit: Payer: Medicare Other | Admitting: Family Medicine

## 2019-06-21 ENCOUNTER — Other Ambulatory Visit: Payer: Self-pay | Admitting: Infectious Diseases

## 2019-06-21 DIAGNOSIS — B20 Human immunodeficiency virus [HIV] disease: Secondary | ICD-10-CM

## 2019-06-22 NOTE — Progress Notes (Signed)
[facility] Progress Note Patient Name: TIMMOTHY BARANOWSKI DOB: 1956/01/09 MRN: [id number]   Date of Service  06/08/19  HPI/Events of Note  Notified of patient's passing. Pt was DNR.  eICU Interventions  Pt expired 06/08/2019 at 0542.     Intervention Category Minor Interventions: Other:  Elsie Lincoln 2019-06-08, 6:41 AM

## 2019-06-22 NOTE — Progress Notes (Signed)
Nurse Tech called Nurse to the room.  Patient with no signs of breathing, no rise an fall of chest, No heart beat upon ascultation, no pulses noted.  Charge Nurse Jeffrey [name] at bedside to confirm that patient has expired.  Charge Nurse Jeffrey [name] and myself pronounce patient expiration at 5:42am.    E-Link and Dr. [name] Ivanoff notified of patient expiration.    [facility] called.  Pt. Is not an acceptable Donor.  Reference number for [facility] is [phone].  I have spoken with Jeffrey [name].    Patient's next of Kin called Jeffrey [name] and notified of his brother's expiration.  Jeffrey [name] and his family will be coming to the hospital to see Jeffrey [name].

## 2019-06-22 NOTE — Progress Notes (Signed)
110ml of fentanyl wasted.  Witnessed by Racheal Patches

## 2019-06-22 DEATH — deceased

## 2019-07-23 NOTE — Death Summary Note (Signed)
  DEATH SUMMARY   Patient Details  Name: [name] MRN: [id number] DOB: [birth year]  Admission/Discharge Information   Admit Date:  20-May-2019  Date of Death: Date of Death: Jun 06, 2019  Time of Death: Time of Death: 0542  Length of Stay: 2022-12-08  Referring Physician: No primary care provider on file.   Reason(s) for Hospitalization  Out of hospital VF arrest, anterior STEMI  Diagnoses  Preliminary cause of death:   Anoxic encephalopathy  Secondary Diagnoses (including complications and co-morbidities):  Principal Problem:   Anoxic brain damage (HCC) Active Problems:   HIV disease ([place])   COPD (chronic obstructive pulmonary disease) (HCC)   Anemia of chronic disease   Essential hypertension   ESRD on hemodialysis ([place])   Cardiac arrest ([place])   Acute anterior wall MI ([place])   Acute respiratory failure ([place])   Shock circulatory ([place])   High anion gap metabolic acidosis   Pressure injury of skin   Brief Hospital Course (including significant findings, care, treatment, and services provided and events leading to death)  Jeffrey [name] is a 63 y.o. year old male with history of end-stage renal disease on home HD, CAD, aortic valve replacement 12/13/17 for AI and then valvular endocarditis, hepatitis B, HIV, peripheral vascular disease, hypothyroidism, prior left VATS for fibrothorax/bacteremia. He had reportedly been experiencing some left shoulder pain, then experienced a VF arrest at home.  Resuscitation took about 35 minutes.  He went urgently to the Cath Lab and a mid LAD 100% stenosis was identified, stented with a drug-eluting stent.  Also had PTCI to his distal apical LAD.  He returned to the ICU mechanically ventilated and in shock.  Therapeutic hypothermia was initiated.  Required CVVHD.  Levophed was able to be weaned off 8/29.  EEG 8/30 showed frequent sharply contoured discharges with triphasic morphology.  They questioned irritability versus possible cefepime neurotoxicity.  No  overt seizure activity although may have been consistent with epileptiform irritation.  An MRI brain done on 9/1 showed patchy multifocal ischemic infarcts bilaterally.  He was sedated with propofol to try and suppress his triphasic EEG findings.  Unfortunately these returned when the propofol was lightened.  He was treated with Keppra and Vimpat.  Unfortunately his neurological status did not improve.  Even off sedation he kept a GCS 4.  We discussed his status, poor prognosis neurologically with his family.  Palliative care service was also involved and assisted with discussions.  Decision was made to transition to comfort care and to enact compassionate extubation.  This was done on 9/11 the patient expired on June 06, 2023.    Rose Fillers Kaveri Perras 07/01/2019, 4:55 PM

## 2019-07-28 IMAGING — CT CT ABD-PELV W/ CM
2 of 5 series · 16 of 46 positions shown, 18 images · IV contrast (ISOVUE 300)
Comparison: CT abdomen 08/03/2016

CLINICAL DATA: High-grade dysplasia of duodenal polyp identified on
upper endoscopy.

EXAM:
CT ABDOMEN AND PELVIS WITH CONTRAST
TECHNIQUE: Multidetector CT imaging of the abdomen and pelvis was performed
using the standard protocol following bolus administration of
intravenous contrast.
CONTRAST:  100mL 3JXPNK-NSS IOPAMIDOL (3JXPNK-NSS) INJECTION 61%

[Series 2: axial st · axial · 0.84mm/px · z∈[+1122,+1537]mm · 13 of 95 slices shown, 15 images]
[im 6/95  soft-tissue]
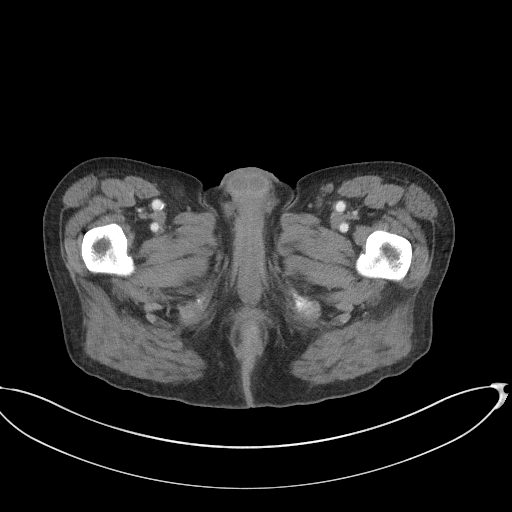
[im 6/95  bone]
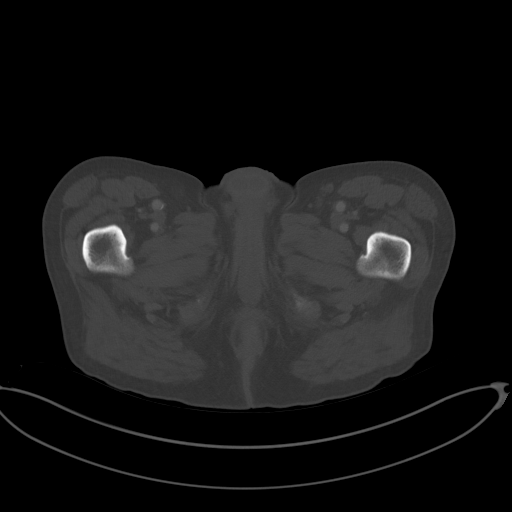
[im 12/95  soft-tissue]
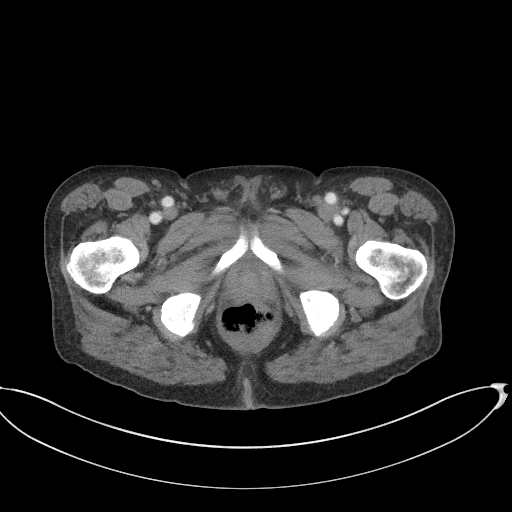
[im 18/95  soft-tissue]
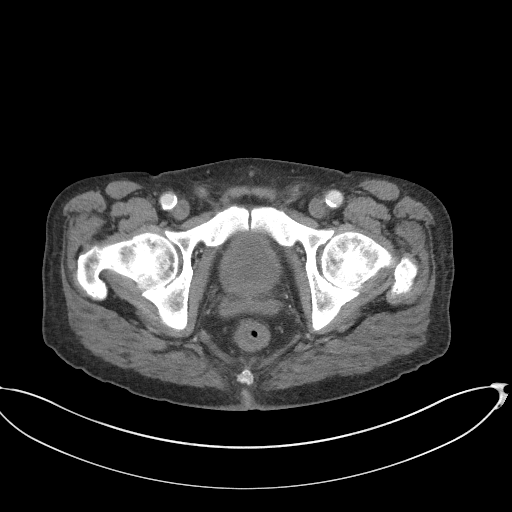
[im 30/95  soft-tissue]
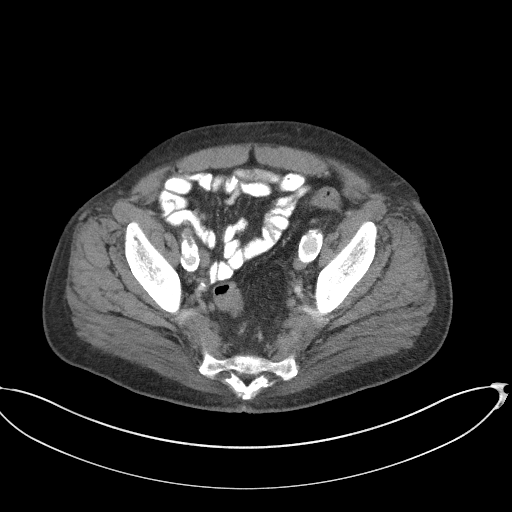
[im 36/95  soft-tissue]
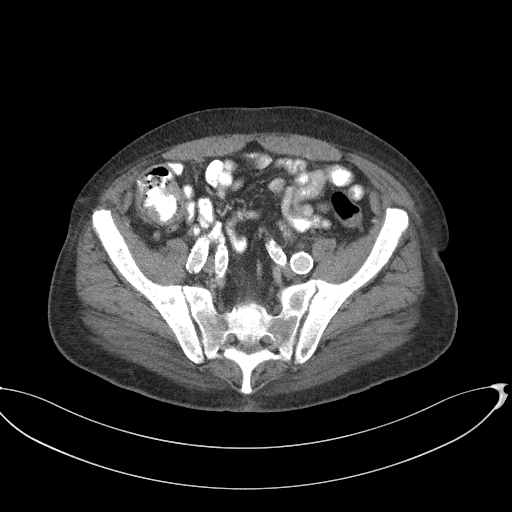
[im 42/95  soft-tissue]
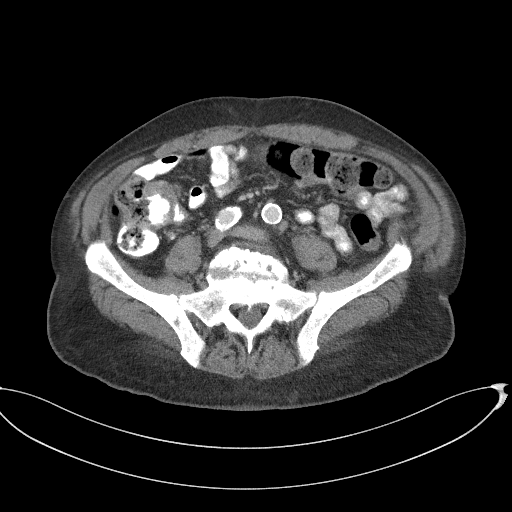
[im 48/95  soft-tissue]
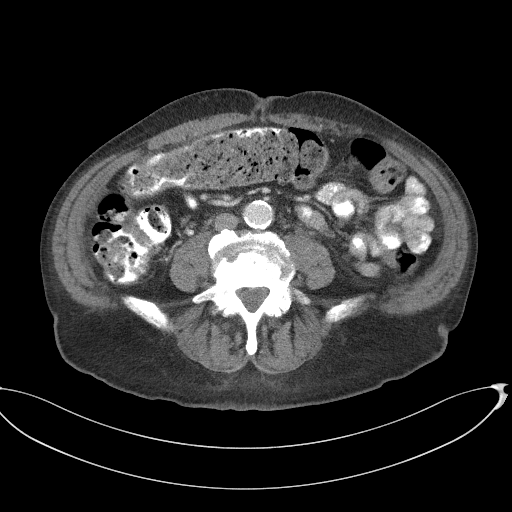
[im 53/95  soft-tissue]
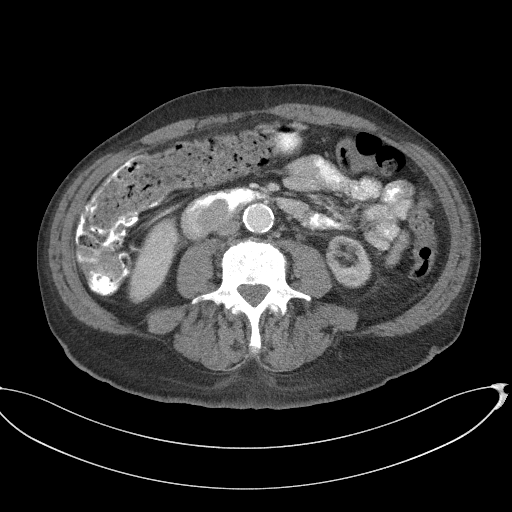
[im 59/95  soft-tissue]
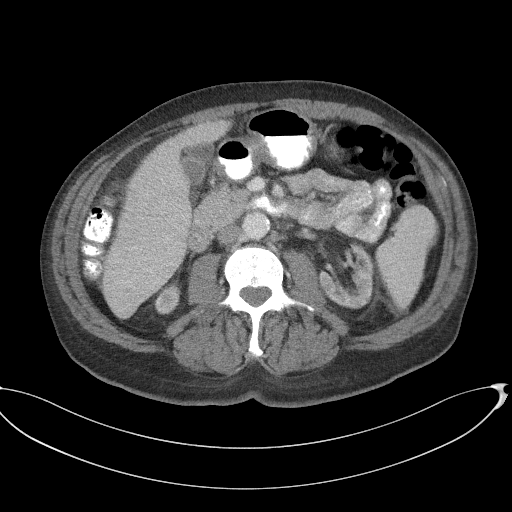
[im 59/95  bone]
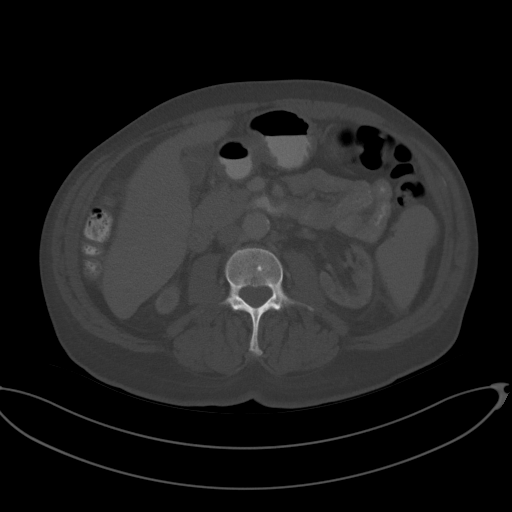
[im 65/95  soft-tissue]
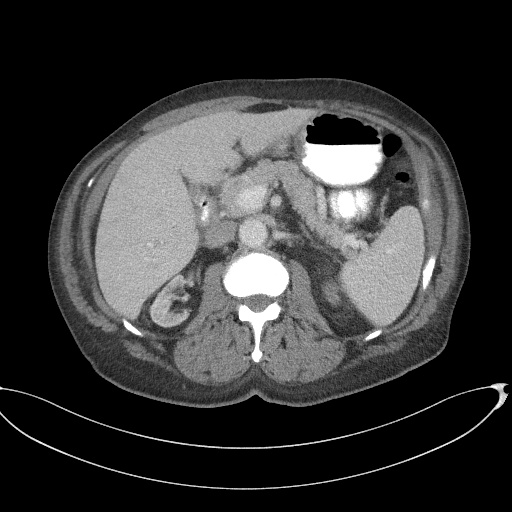
[im 77/95  soft-tissue]
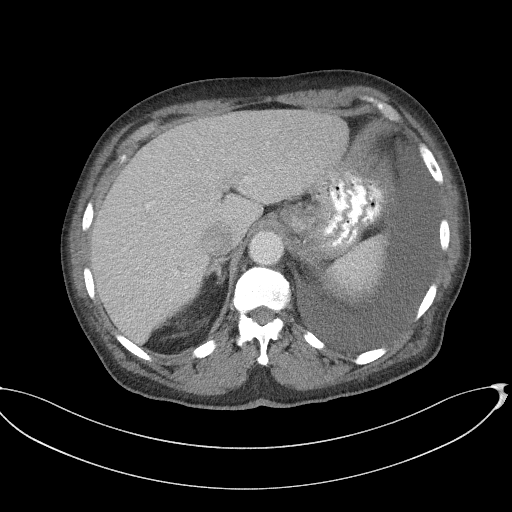
[im 83/95  soft-tissue]
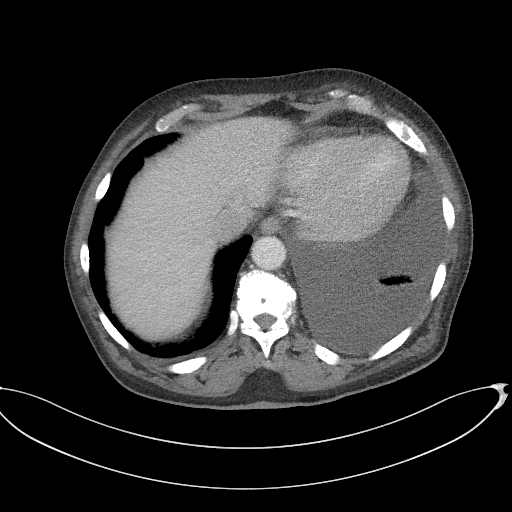
[im 89/95  soft-tissue]
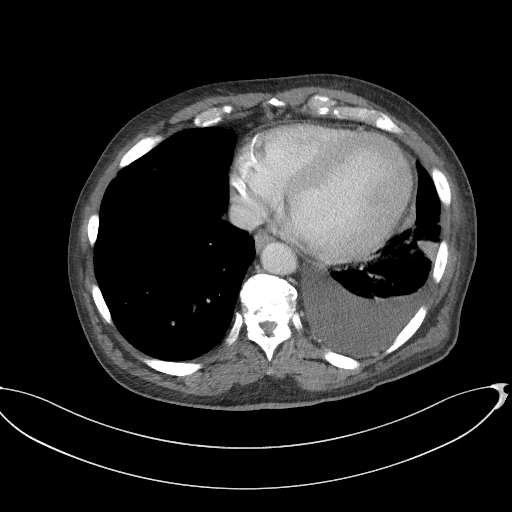

[Series 5: coronal st · coronal · 0.85mm/px · 3 of 102 slices shown]
[im 34/102  soft-tissue]
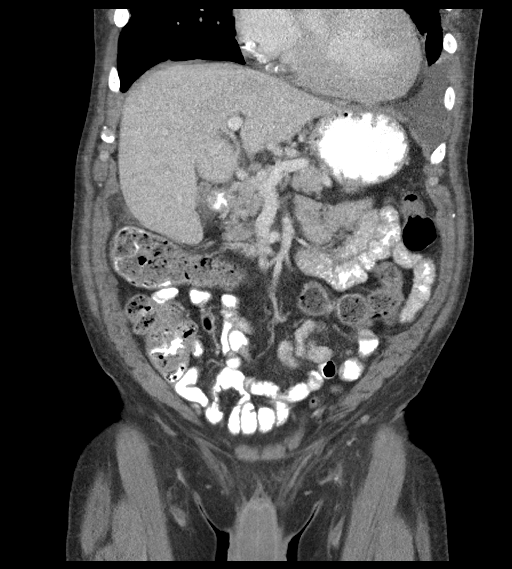
[im 45/102  soft-tissue]
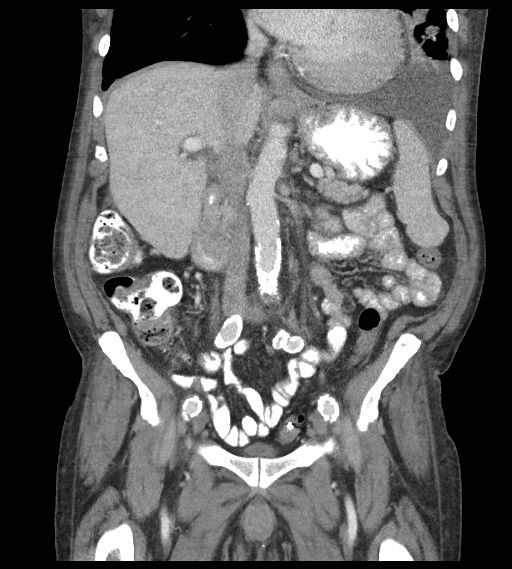
[im 57/102  soft-tissue]
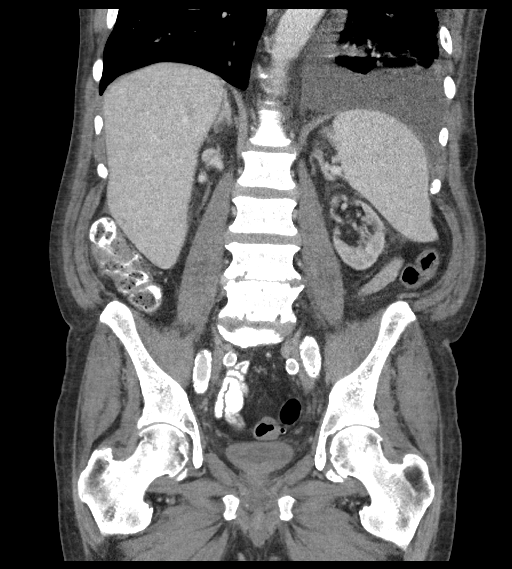

[16 of 46 positions shown; findings below may reference images not displayed]

FINDINGS: Lower chest: Moderate LEFT effusion increased from prior. LEFT lower
lobe nodule measuring 8 mm (image [DATE].

Hepatobiliary: No focal hepatic lesion. Gallbladder normal. Small
stone within the gallbladder. No inflammation. Common bile duct
normal caliber.

Pancreas: Pancreas is normal. No ductal dilatation. No pancreatic
inflammation.

Spleen: Normal spleen

Adrenals/urinary tract: Adrenal glands and kidneys normal. The
kidneys are atrophic. There is renal enhancement. Bladder normal.

Stomach/Bowel: Stomach is normal or first portion duodenum normal.
In the a proximal third portion the duodenum measures 3.4 cm ovoid
filling defect (image 43/2 which corresponds to the adenoma
described on endoscopy report. No evidence obstruction. The small
bowel is normal. Appendix is normal. Moderate volume stool
throughout the colon. Rectosigmoid colon normal.

Vascular/Lymphatic: Abdominal aorta is normal caliber with
atherosclerotic calcification. There is no retroperitoneal or
periportal lymphadenopathy. No pelvic lymphadenopathy.

Reproductive: Prostate normal

Other: No free fluid.

Musculoskeletal: The bulky osteophytosis of the lower lumbar spine.
IMPRESSION: 1. Moderate LEFT effusion.
2. Stable LEFT lobe pulmonary nodule compared [id number]
3. Ovoid mass within the proximal third portion of the duodenum
corresponds to endoscopy report.
4.  Aortic Atherosclerosis ([id number].D).

## 2019-08-16 IMAGING — CT CT HEAD W/O CM
5 of 16 series · 17 of 47 positions shown, 18 images · non-contrast
Comparison: CT head 09/17/2012

CLINICAL DATA: Generalized weakness, grand mal seizure. History of
end stage renal disease on dialysis.

EXAM:
CT HEAD WITHOUT CONTRAST
TECHNIQUE: Contiguous axial images were obtained from the base of the skull
through the vertex without intravenous contrast.

[Series 3: head wo · axial · 0.51mm/px · z∈[-164,-39]mm · 3 of 26 slices shown, 4 images]
[im 1/26  brain]
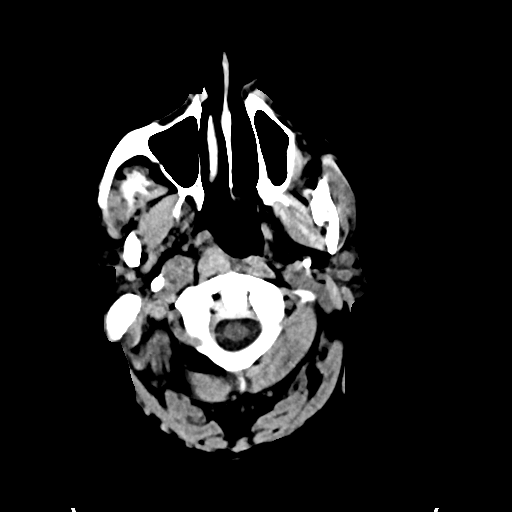
[im 1/26  bone]
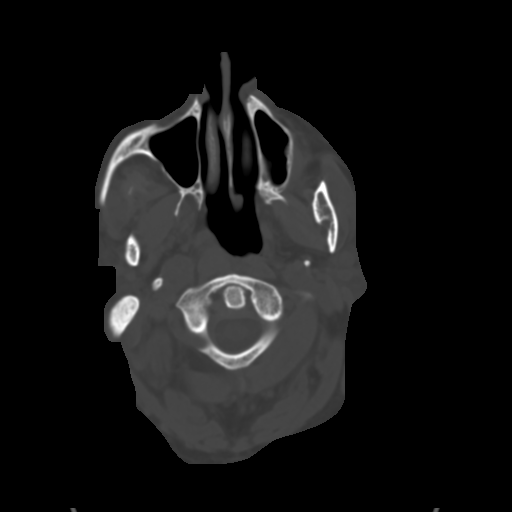
[im 13/26  brain]
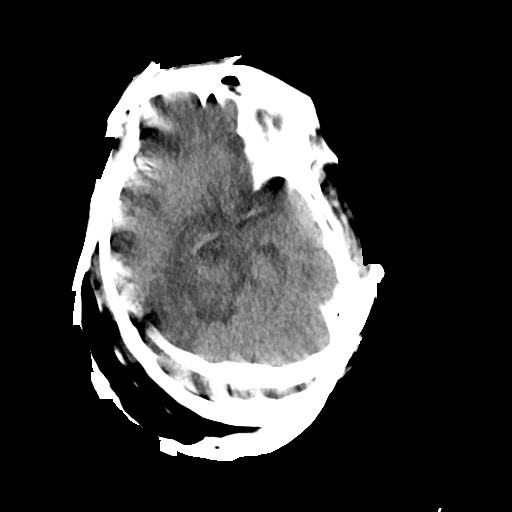
[im 26/26  brain]
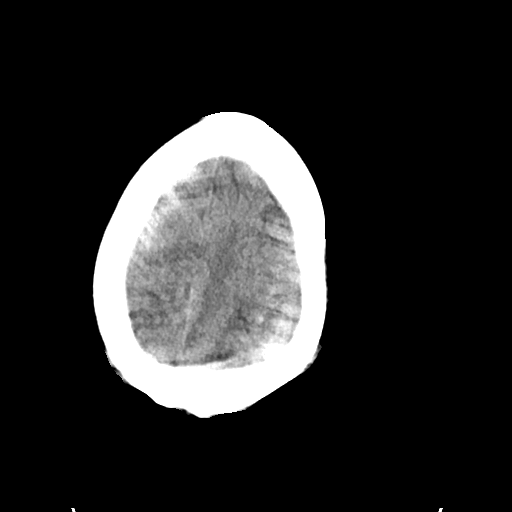

[Series 4: head bone · axial · 0.51mm/px · z∈[-144,-62]mm · 5 of 63 slices shown (1 of 3)]
[im 11/63  bone]
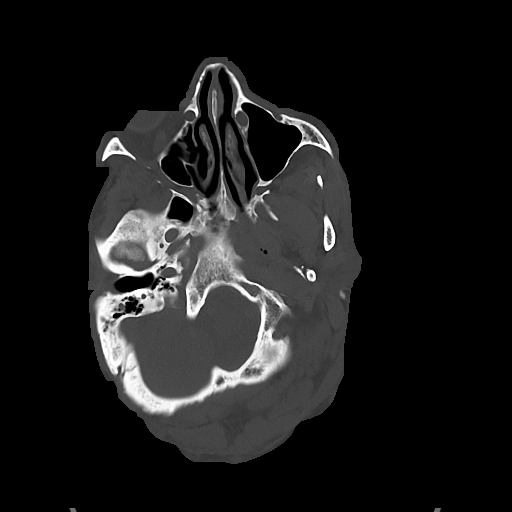
[im 21/63  bone]
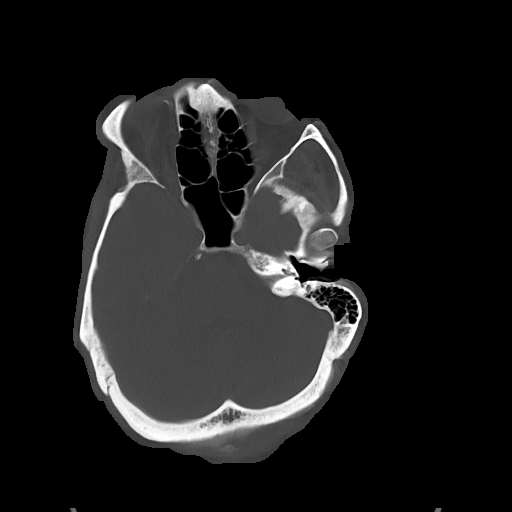
[im 32/63  bone]
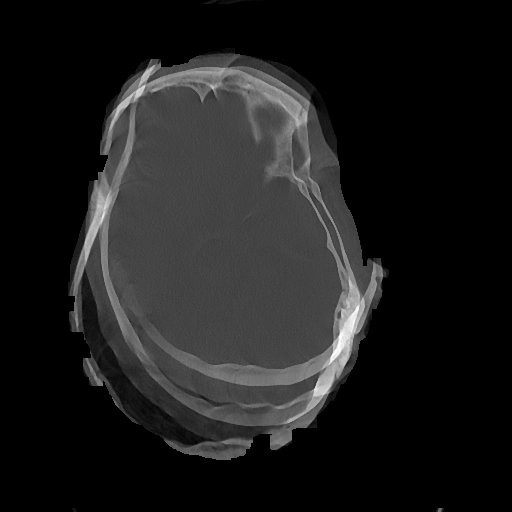
[im 42/63  bone]
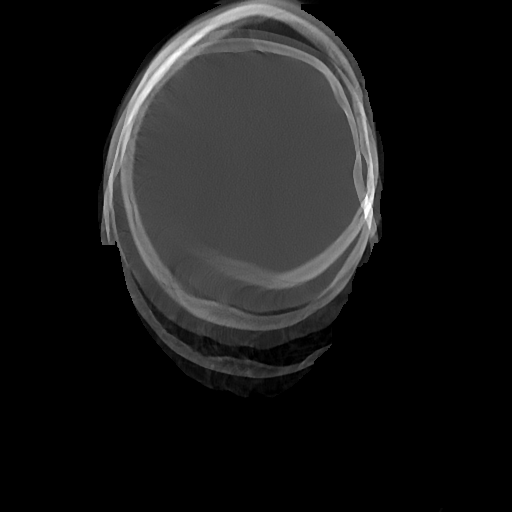
[im 52/63  bone]
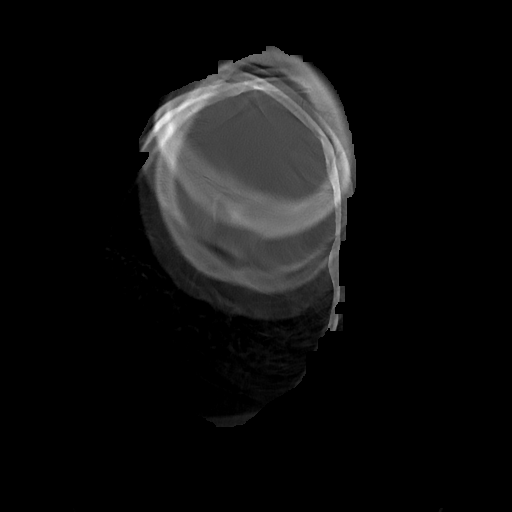

[Series 17: head bone · axial · 0.45mm/px · z∈[-120,-60]mm · 4 of 50 slices shown (2 of 3)]
[im 10/50  bone]
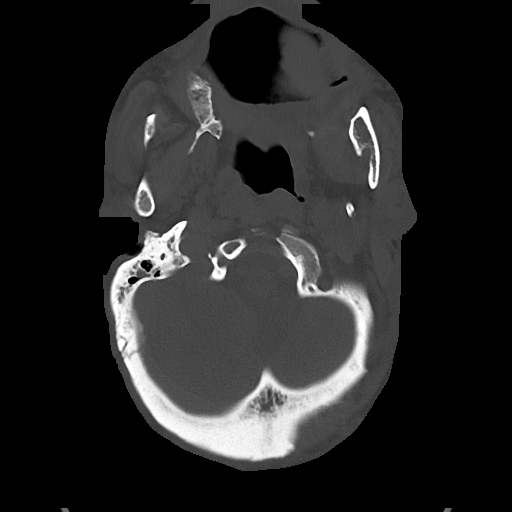
[im 20/50  bone]
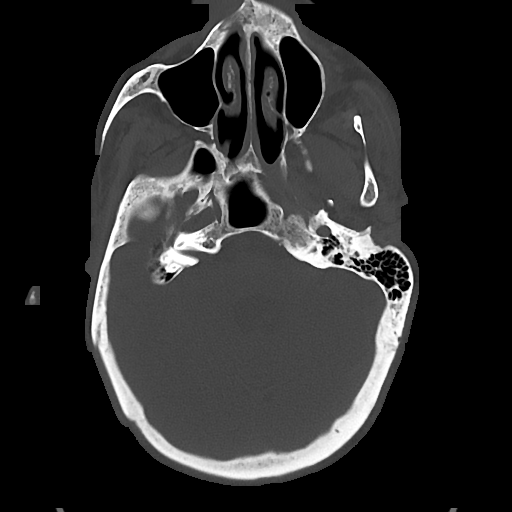
[im 30/50  bone]
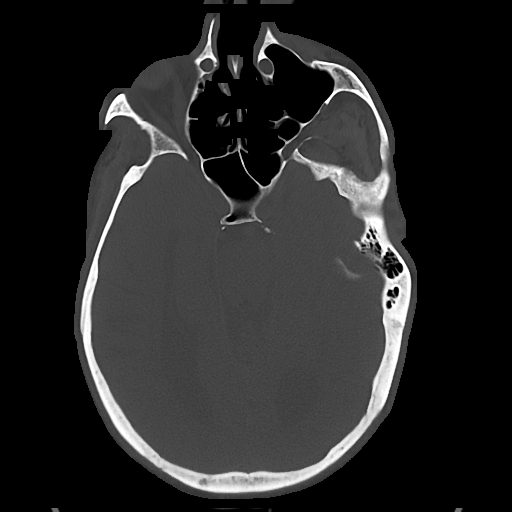
[im 40/50  bone]
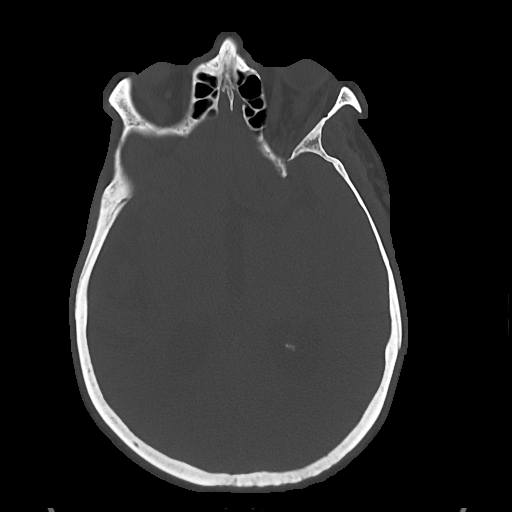

[Series 18: cor soft · coronal · 0.23mm/px · 1 of 74 slices shown]
[im 37/74  brain]
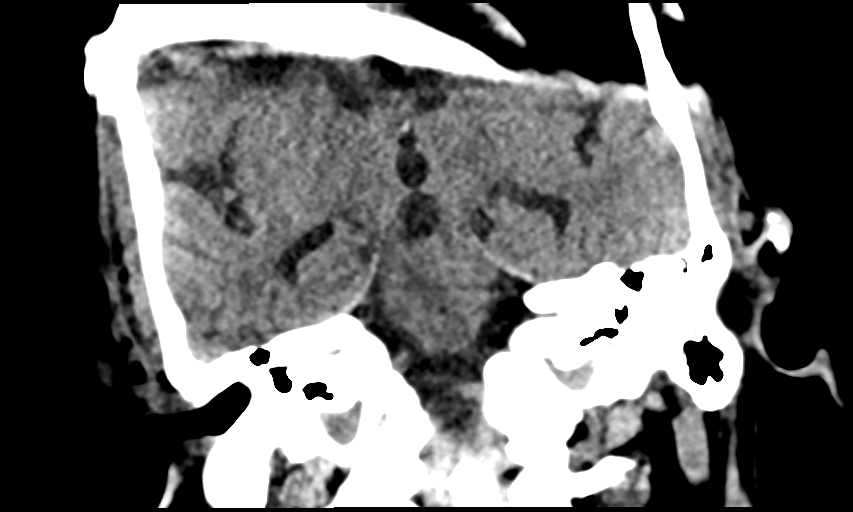

[Series 21: head bone · axial · 0.45mm/px · z∈[-42,+24]mm · 4 of 57 slices shown (3 of 3)]
[im 12/57  bone]
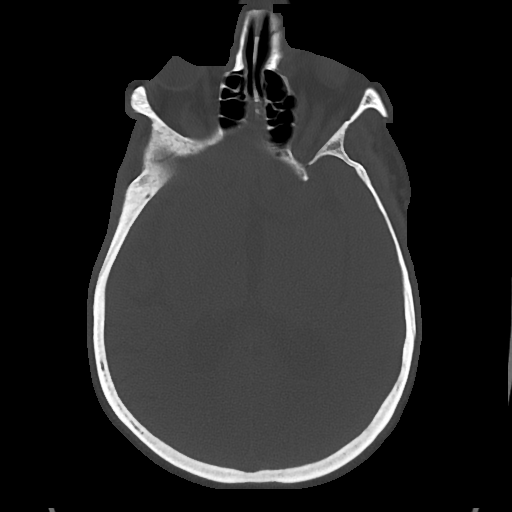
[im 23/57  bone]
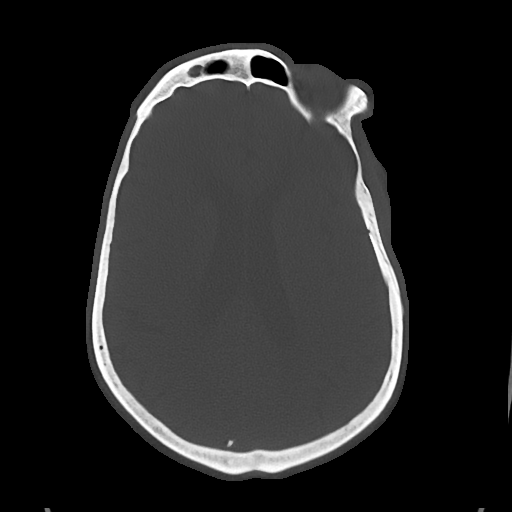
[im 34/57  bone]
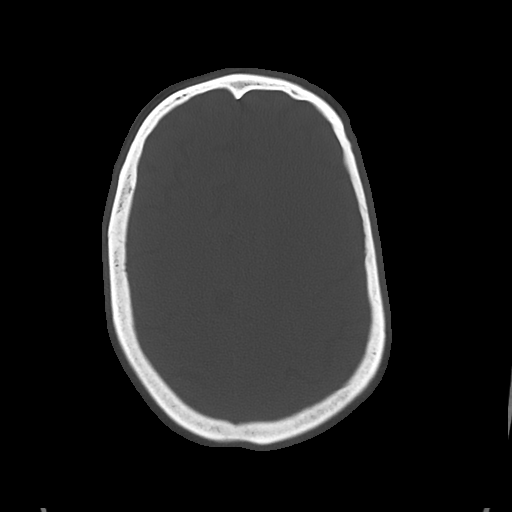
[im 45/57  bone]
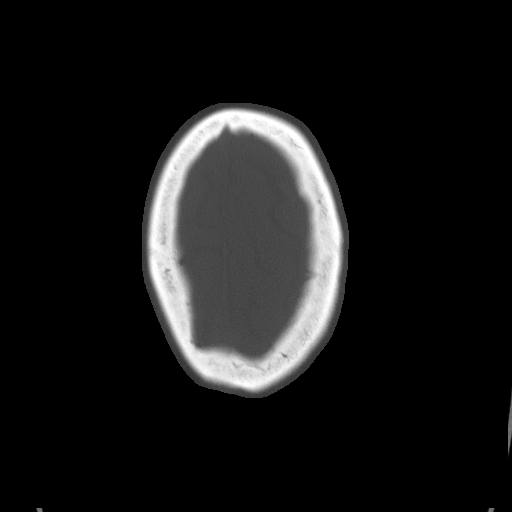

[17 of 47 positions shown; findings below may reference images not displayed]

FINDINGS: Severely motion degraded exam, improved somewhat on multiple
subsequent attempts at imaging.

BRAIN: No intraparenchymal hemorrhage, mass effect nor midline
shift. Mild to moderate parenchymal brain volume loss. No
hydrocephalus. Patchy to confluent supratentorial white matter
hypodensities. No acute large vascular territory infarcts. No
abnormal extra-axial fluid collections. Basal cisterns are patent.

VASCULAR: Mild calcific atherosclerosis of the carotid siphons.

SKULL: No skull fracture. No significant scalp soft tissue swelling.

SINUSES/ORBITS: Right mastoid effusion.The included ocular globes
and orbital contents are non-suspicious.

OTHER: None.
IMPRESSION: 1.  No acute intracranial process on this motion degraded exam.

2.  Mild-moderate parenchymal brain volume loss, advanced for age.

3.  Moderate chronic small vessel ischemic changes.

## 2019-08-16 IMAGING — DX DG CHEST 1V PORT
2 series · 2 of 2 positions shown · non-contrast
Comparison: Chest CT 09/24/2017, included lung bases from abdominal
CT 02/02/2018

CLINICAL DATA: Seizure.

EXAM:
PORTABLE CHEST 1 VIEW

[chest ap (1 of 2)]
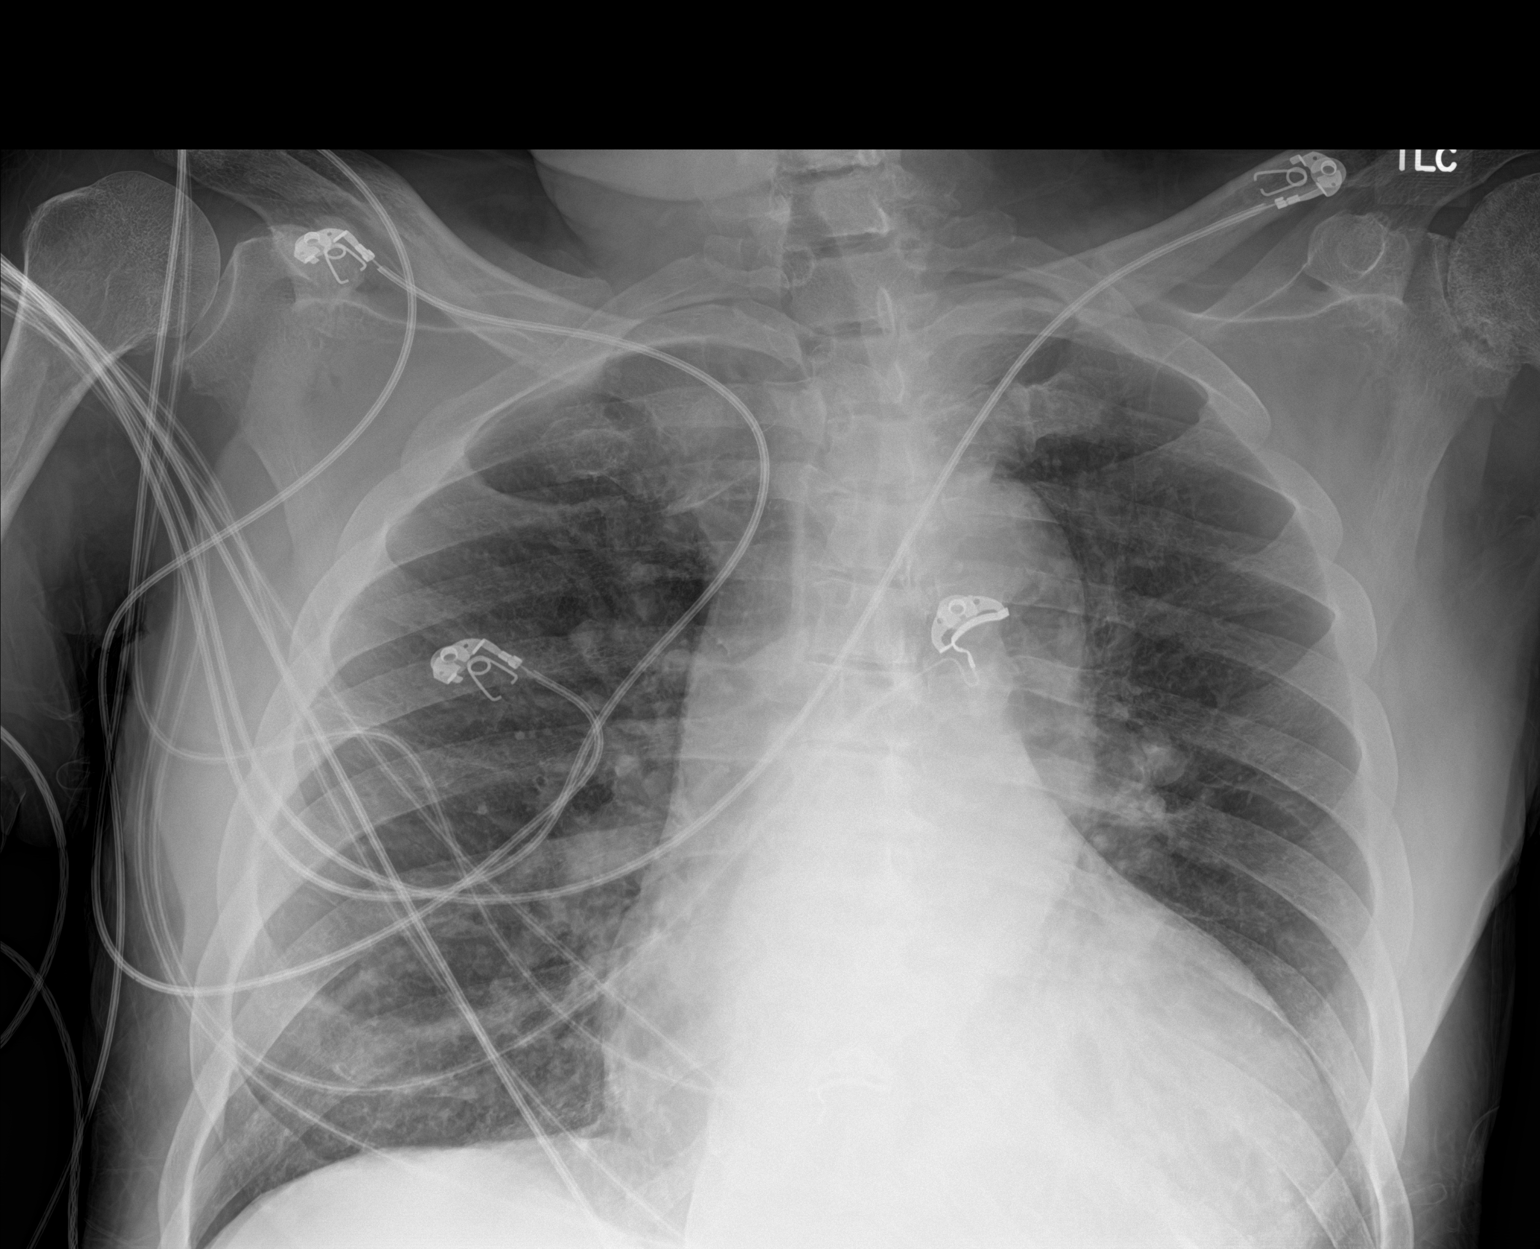

[chest ap (2 of 2)]
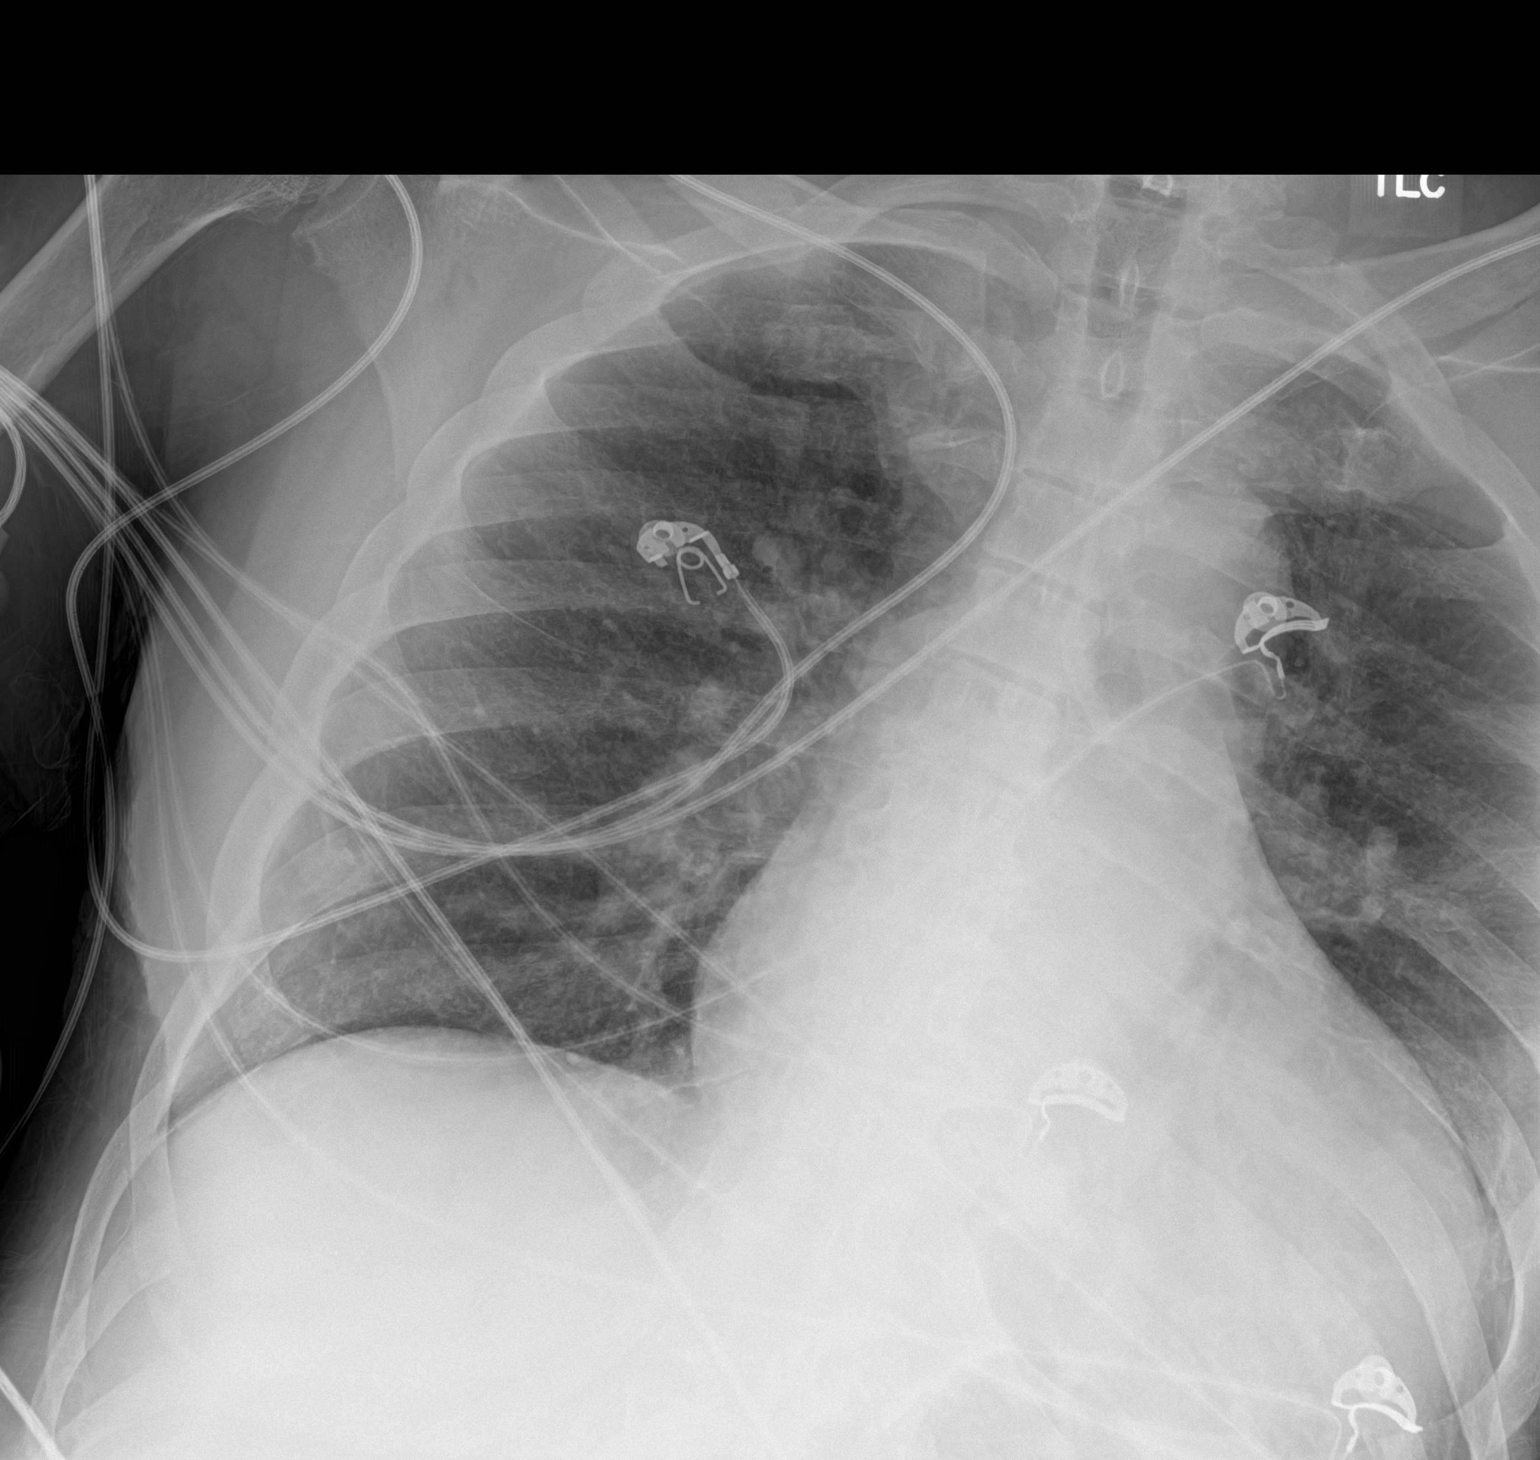

[2 of 2 positions shown; findings below may reference images not displayed]

FINDINGS: Despite repeat exam, the left costophrenic angle is excluded from
the field of view. The heart is enlarged. Left pleural effusion
suboptimally evaluated on portable exam. Mild right infrahilar
atelectasis. Mild vascular congestion. No pneumothorax. Chronic
change about the left shoulder, partially included.
IMPRESSION: 1. Cardiomegaly and vascular congestion.
2. Left pleural effusion not well assessed by portable technique.

## 2019-08-17 IMAGING — DX DG CHEST 1V PORT
1 series · 2 of 2 positions shown · non-contrast
Comparison: Chest radiograph performed 02/21/2018

CLINICAL DATA: Central line placement.

EXAM:
PORTABLE CHEST 1 VIEW

[Series 1: chest · 0.14mm/px · 2 of 2 slices shown]
[im 1/2]
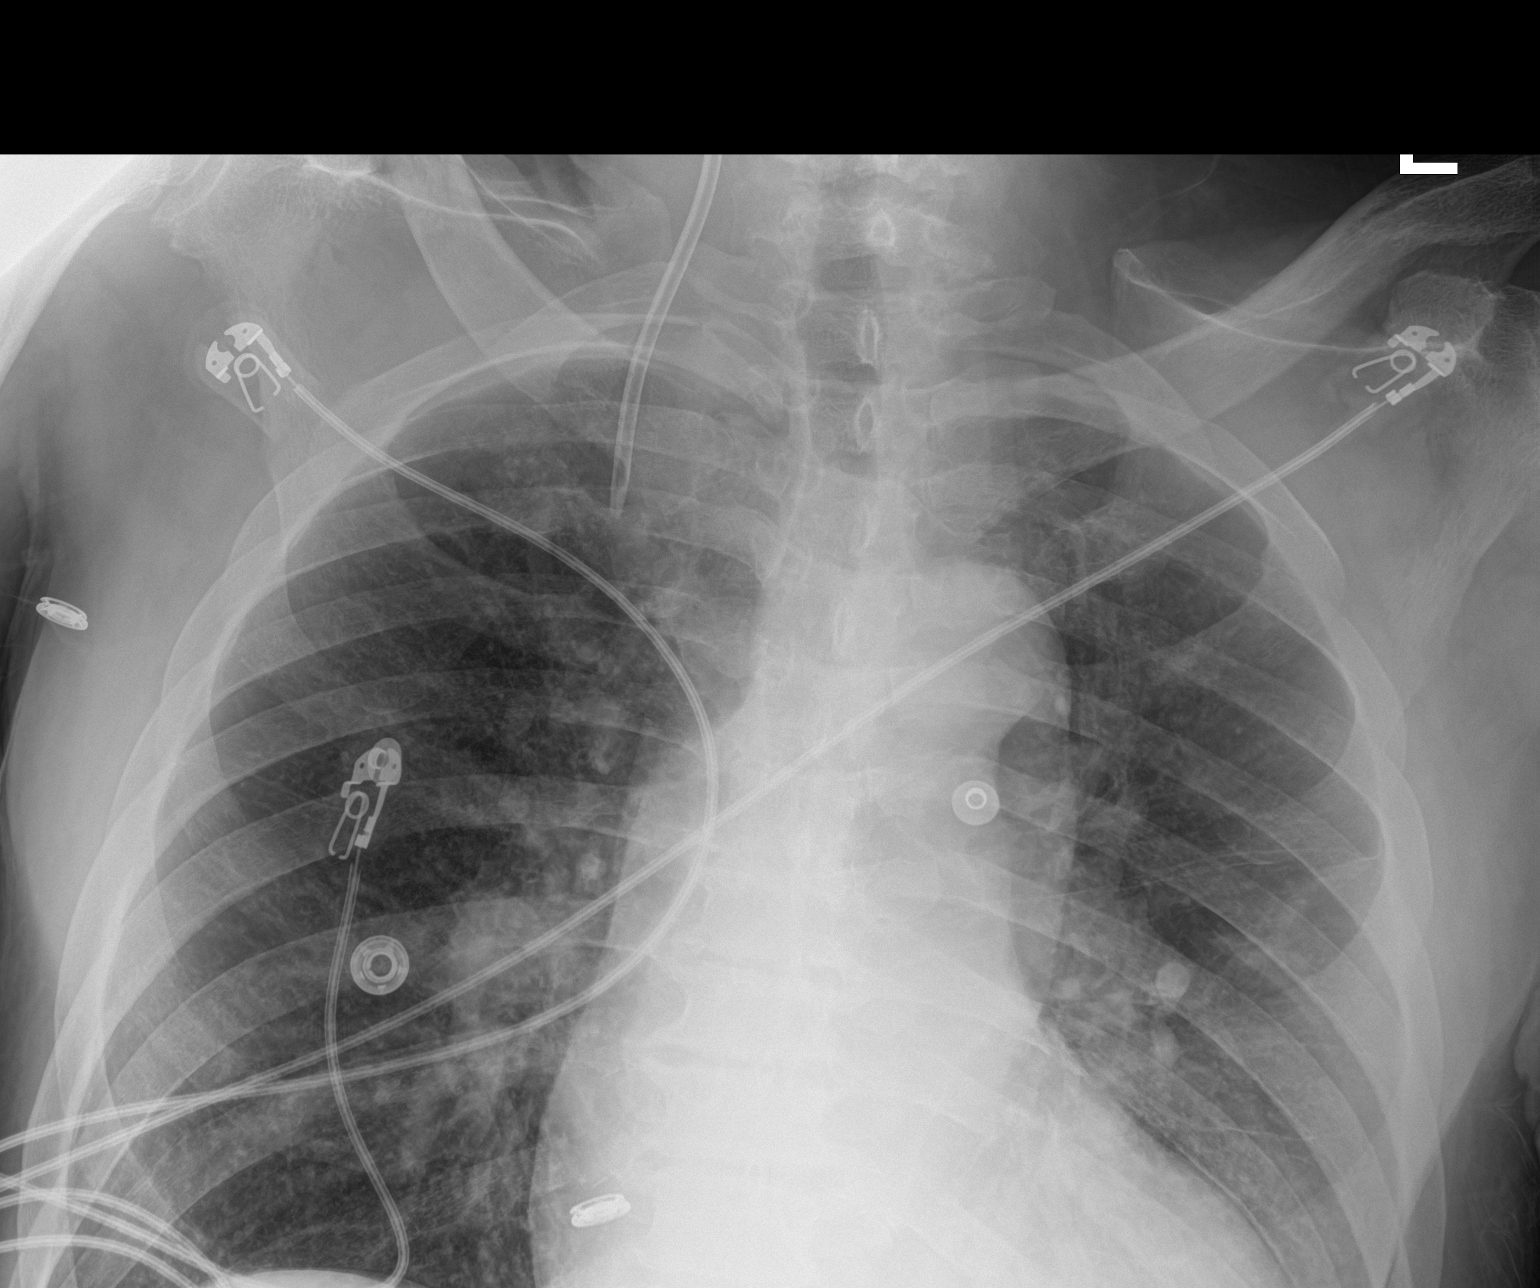
[im 2/2]
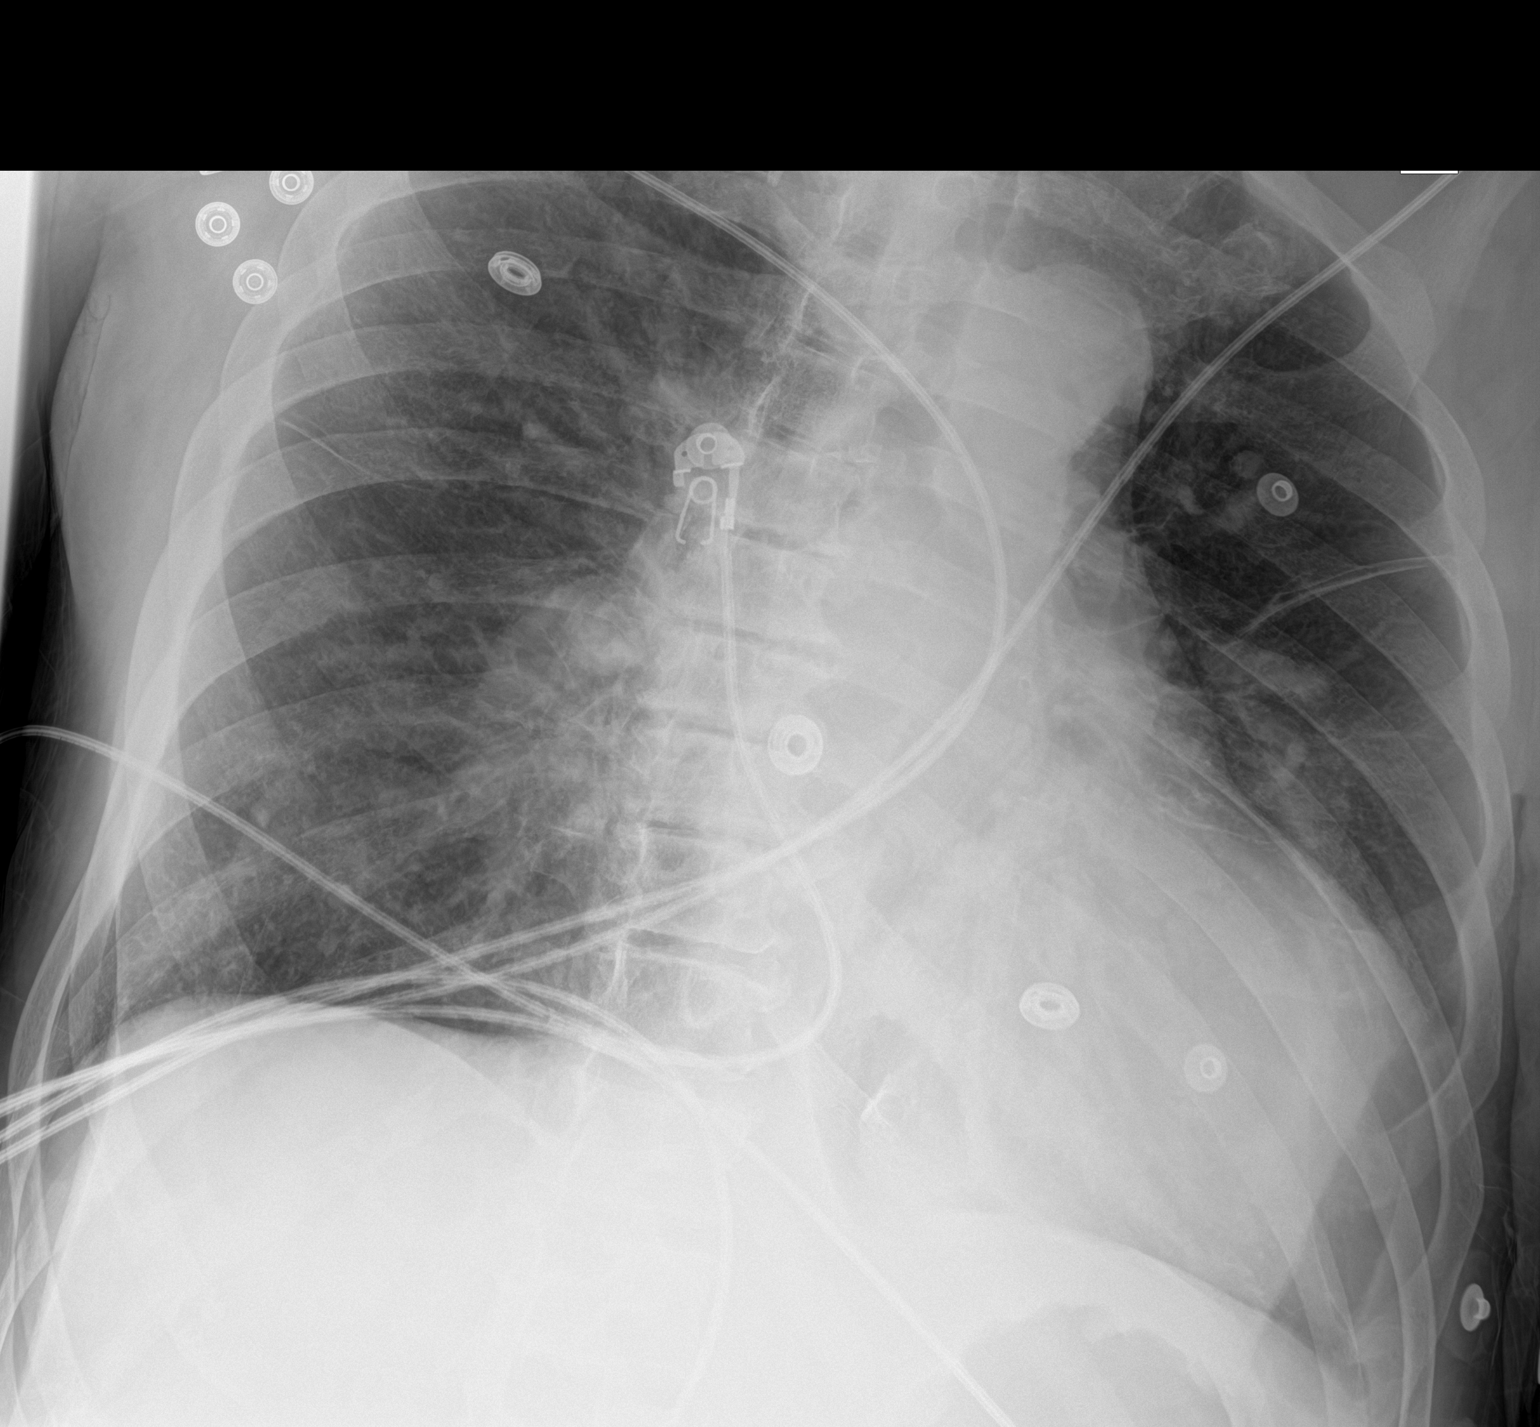

[2 of 2 positions shown; findings below may reference images not displayed]

FINDINGS: The patient's right IJ line is noted ending about the proximal SVC.

Vascular congestion is noted. Increased interstitial markings may
reflect mild interstitial edema. A small left pleural effusion is
again noted. No pneumothorax is seen.

The cardiomediastinal silhouette is borderline enlarged. No acute
osseous abnormalities are identified
IMPRESSION: 1. Right IJ line noted ending about the proximal SVC. No
pneumothorax.
2. Vascular congestion and borderline cardiomegaly. Increased
interstitial markings may reflect mild interstitial edema. Small
left pleural effusion again noted.

## 2019-08-18 IMAGING — MR MR HEAD W/O CM
12 of 13 series · 43 of 48 positions shown · non-contrast
Comparison: Prior CT from 02/21/2018.

CLINICAL DATA: Initial evaluation for acute seizure, altered mental
status.

EXAM:
MRI HEAD WITHOUT CONTRAST
TECHNIQUE: Multiplanar, multiecho pulse sequences of the brain and surrounding
structures were obtained without intravenous contrast.

[Series 5: ax dwi_tracew · axial · 3.0mm · 1.50mm/px · z∈[-65,+87]mm · 7 of 80 slices shown]
[im 1/80]
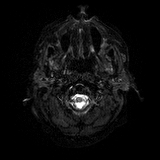
[im 14/80]
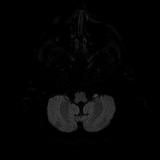
[im 27/80]
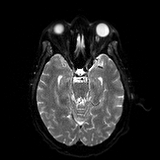
[im 40/80]
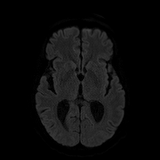
[im 53/80]
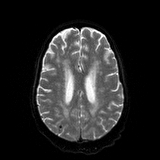
[im 66/80]
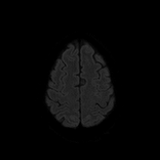
[im 80/80]
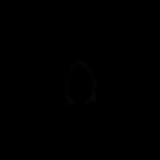

[Series 6: ax dwi_adc · axial · 3.0mm · 1.50mm/px · z∈[-65,+83]mm · 4 of 39 slices shown]
[im 1/39]
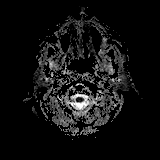
[im 13/39]
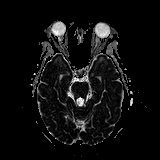
[im 26/39]
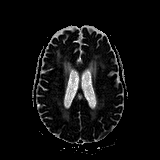
[im 39/39]
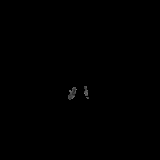

[Series 7: cor dwi_tracew · coronal · 5.0mm · 1.44mm/px · 6 of 68 slices shown]
[im 1/68]
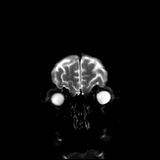
[im 14/68]
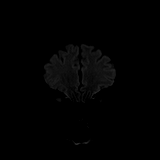
[im 27/68]
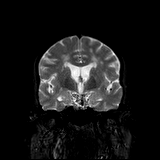
[im 41/68]
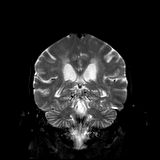
[im 54/68]
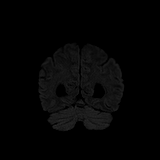
[im 68/68]
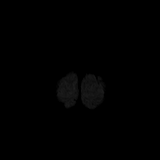

[Series 8: cor dwi_adc · coronal · 5.0mm · 1.44mm/px · 3 of 34 slices shown]
[im 1/34]
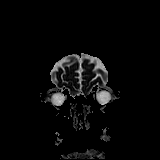
[im 17/34]
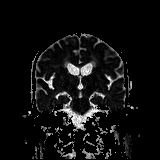
[im 34/34]
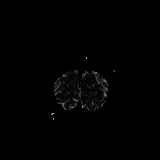

[Series 9: T1 · sagittal · 5.0mm · 0.75mm/px · 2 of 24 slices shown]
[im 1/24]
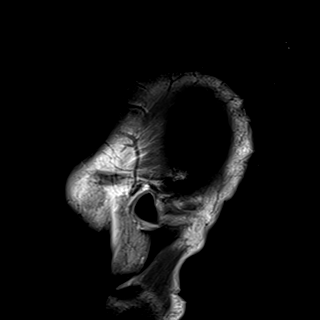
[im 24/24]
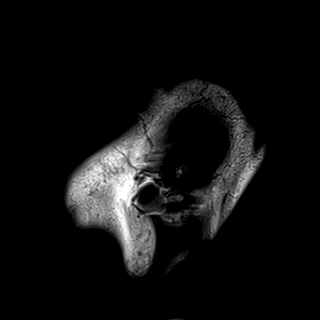

[Series 10: T2 · axial · 5.0mm · 0.69mm/px · z∈[-56,+88]mm · 2 of 25 slices shown (1 of 3)]
[im 1/25]
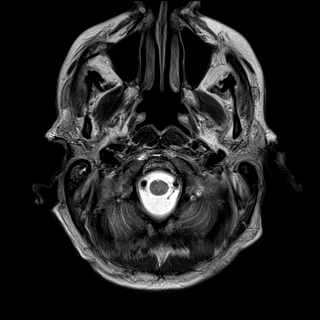
[im 25/25]
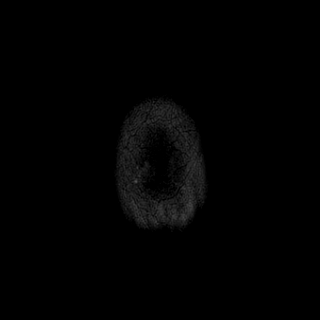

[Series 11: FLAIR · axial · 5.0mm · 0.43mm/px · z∈[-55,+88]mm · 2 of 25 slices shown]
[im 1/25]
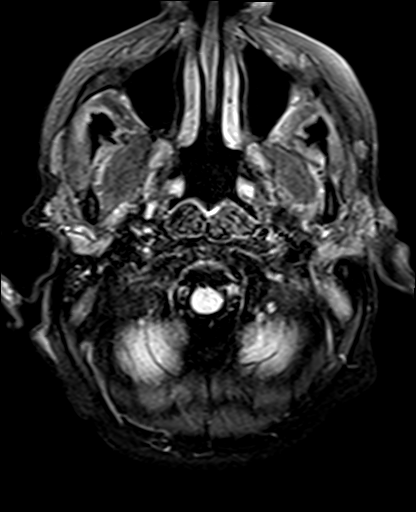
[im 25/25]
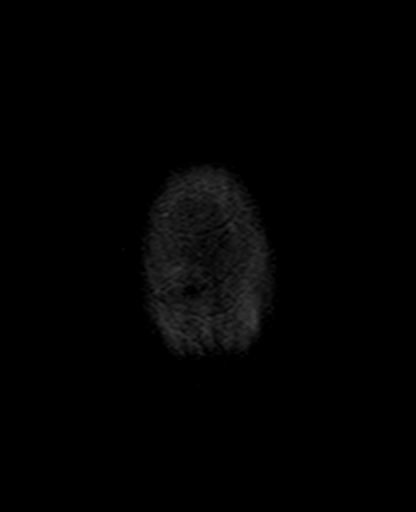

[Series 12: swi_images · axial · 3.0mm · 0.86mm/px · z∈[-64,+89]mm · 5 of 52 slices shown]
[im 1/52]
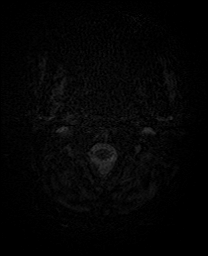
[im 13/52]
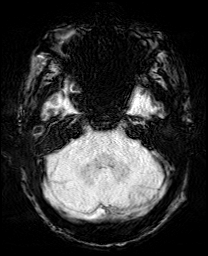
[im 26/52]
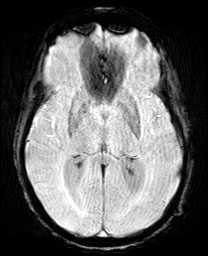
[im 39/52]
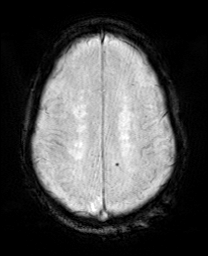
[im 52/52]
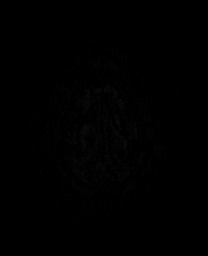

[Series 13: mip_images(sw) · axial · 24.0mm · 0.86mm/px · z∈[-53,+78]mm · 4 of 45 slices shown]
[im 1/45]
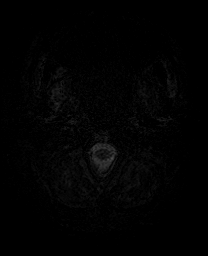
[im 15/45]
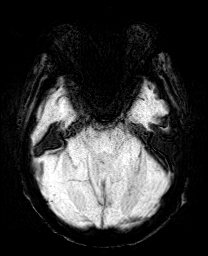
[im 30/45]
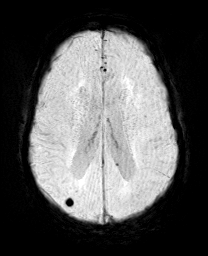
[im 45/45]
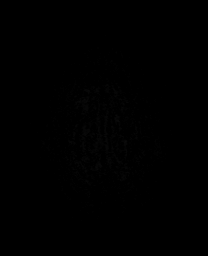

[Series 15: t2_blade_dark-fluid_tra_p2 · axial · 5.0mm · 0.90mm/px · z∈[-60,+96]mm · 2 of 25 slices shown]
[im 1/25]
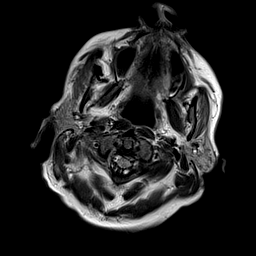
[im 25/25]
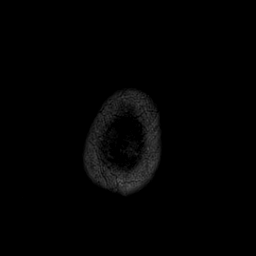

[Series 16: T2 · coronal · 3.0mm · 0.27mm/px · 3 of 32 slices shown (2 of 3)]
[im 1/32]
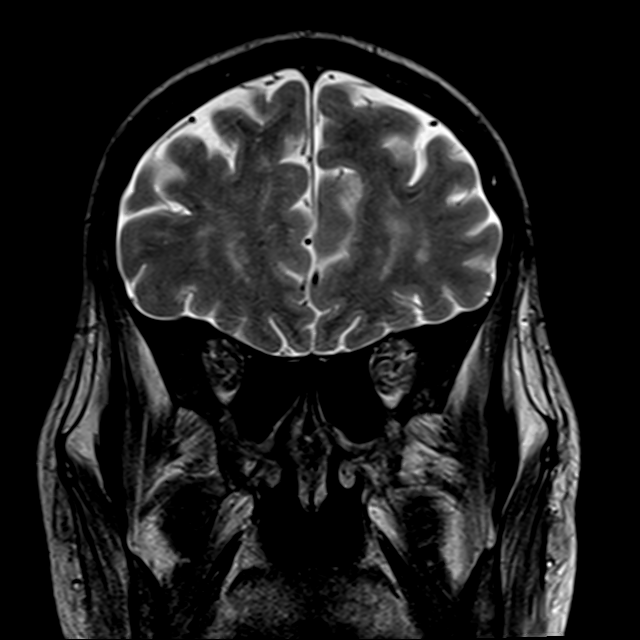
[im 16/32]
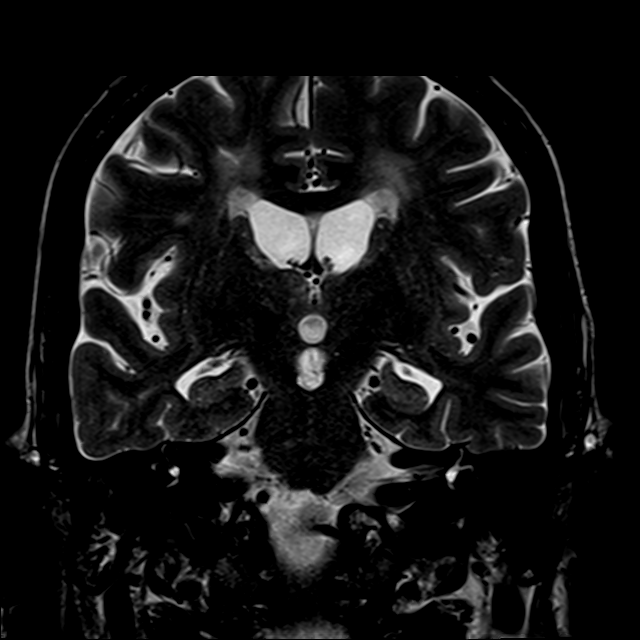
[im 32/32]
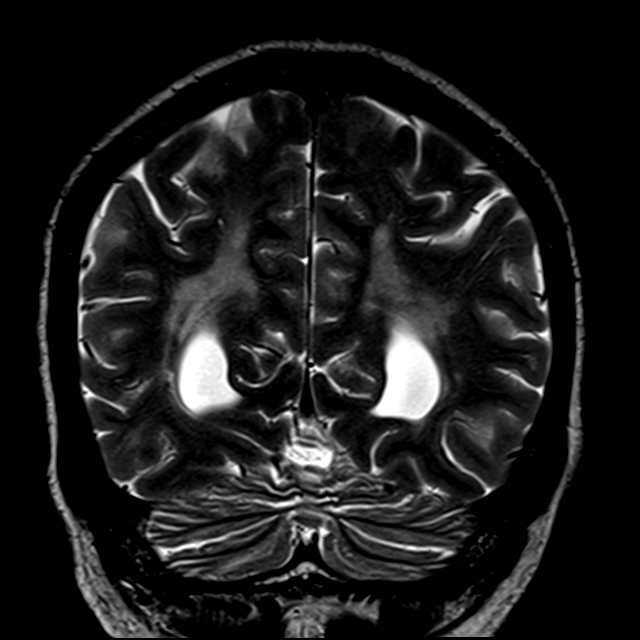

[Series 17: T2 · coronal · 5.0mm · 0.34mm/px · 3 of 29 slices shown (3 of 3)]
[im 1/29]
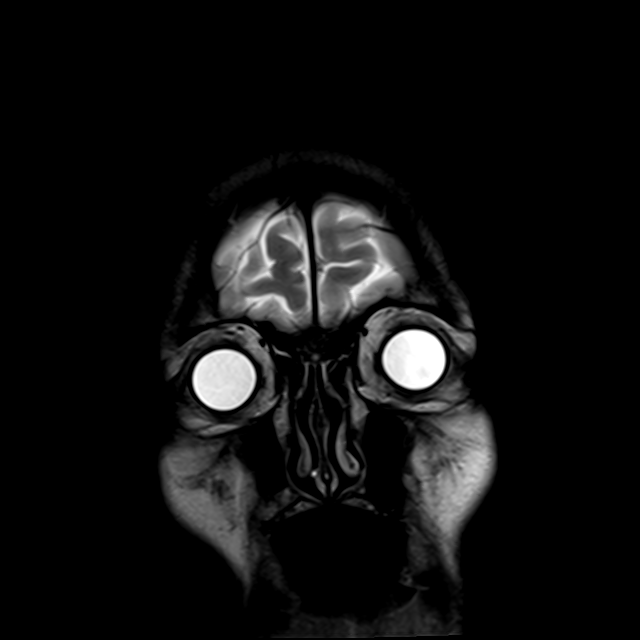
[im 15/29]
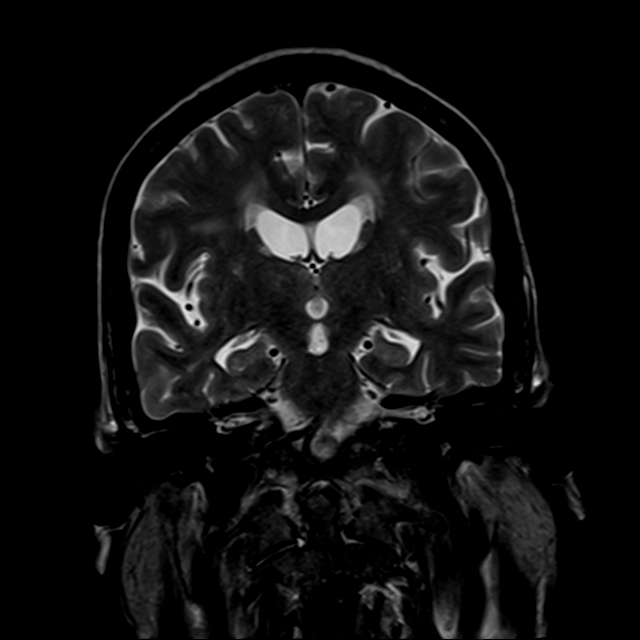
[im 29/29]
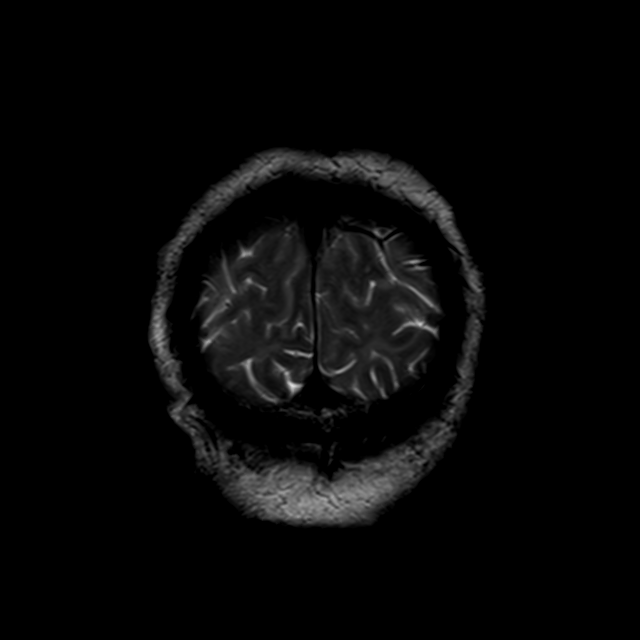

[43 of 48 positions shown; findings below may reference images not displayed]

FINDINGS: Brain: Examination mildly degraded by motion artifact.

Generalized age-related cerebral atrophy. Patchy and confluent
T2/FLAIR hyperintensity within the periventricular and deep white
matter both cerebral hemispheres, most consistent with chronic small
vessel ischemic change, moderate in nature. No abnormal foci of
restricted diffusion to suggest acute or subacute ischemia.
Gray-white matter differentiation maintained. Patchy FLAIR signal
abnormality at the high right parietal lobe suggestive of a possible
small remote cortical infarct (series 11, image 19).

No changes suggestive of acute seizure. No evidence for acute
intracranial hemorrhage. Few small chronic micro hemorrhages noted
within the cerebral and cerebellar hemispheres bilaterally,
suspected to be hypertensive in nature.

No mass lesion, midline shift, or mass effect. No hydrocephalus. No
extra-axial fluid collection. No intrinsic temporal lobe
abnormality. Pituitary gland within normal limits.

Vascular: Major intracranial vascular flow voids maintained.

Skull and upper cervical spine: Craniocervical junction within
normal limits. Degenerative spondylolysis noted within the upper
cervical spine without significant stenosis. Heterogeneous and
decreased T1 signal intensity seen within the visualized bone
marrow, suspected be related in stage renal disease. No definite
marrow replacing lesion. No scalp soft tissue abnormality.

Sinuses/Orbits: Globes and orbital soft tissues within normal
limits. Paranasal sinuses are clear. Right mastoid effusion noted.
Inner ear structures normal.

Other: None.
IMPRESSION: 1. No acute intracranial abnormality.
2. Age-related cerebral atrophy with moderate chronic small vessel
ischemic disease.

## 2019-08-30 IMAGING — DX DG CHEST 1V PORT
1 series · 1 of 1 positions shown · non-contrast
Comparison: 02/22/2018

CLINICAL DATA: Shortness of breath.  Right arm pain.

EXAM:
PORTABLE CHEST 1 VIEW

[chest ap]
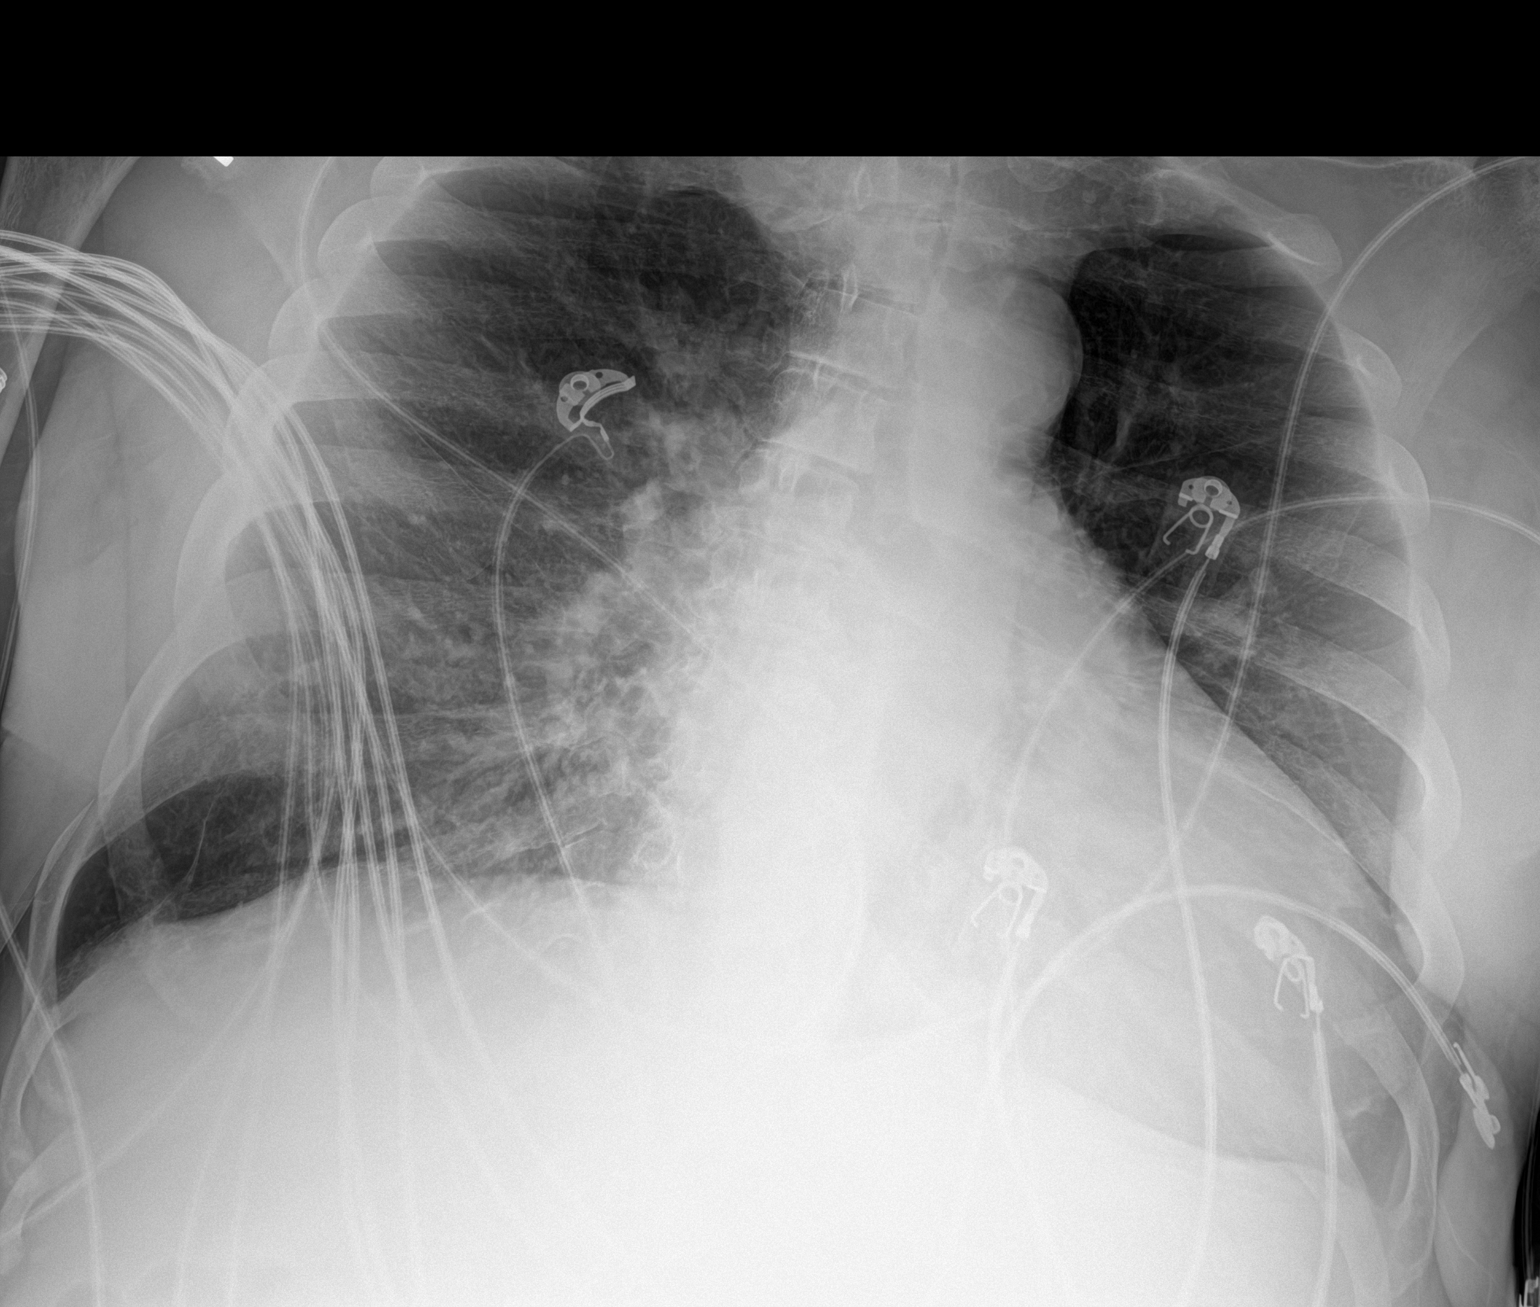

[1 of 1 positions shown; findings below may reference images not displayed]

FINDINGS: 8843 hours. The cardio pericardial silhouette is enlarged. Right IJ
central line is been removed in the interval. There is pulmonary
vascular congestion without overt pulmonary edema. Tiny left pleural
effusion noted. Interstitial markings are diffusely coarsened with
chronic features. The visualized bony structures of the thorax are
intact. Telemetry leads overlie the chest.
IMPRESSION: 1. Cardiomegaly with vascular congestion.
2. Interval removal right IJ central line.
3. Tiny left pleural effusion.

## 2019-09-24 IMAGING — CT CT ABD-PELV W/ CM
2 of 5 series · 14 of 46 positions shown, 16 images · IV contrast (Omni 300)
Comparison: February 02, 2018

CLINICAL DATA: Unexplained anemia. Patient going to dialysis today.
Recent endoscopic removal of duodenal mass. Rule out occult
bleeding.

EXAM:
CT ABDOMEN AND PELVIS WITH CONTRAST
TECHNIQUE: Multidetector CT imaging of the abdomen and pelvis was performed
using the standard protocol following bolus administration of
intravenous contrast.
CONTRAST:  100mL OMNIPAQUE IOHEXOL 300 MG/ML  SOLN

[Series 3: a/p w/ 5mm · axial · 0.79mm/px · z∈[+838,+1243]mm · 11 of 93 slices shown, 13 images]
[im 6/93  soft-tissue]
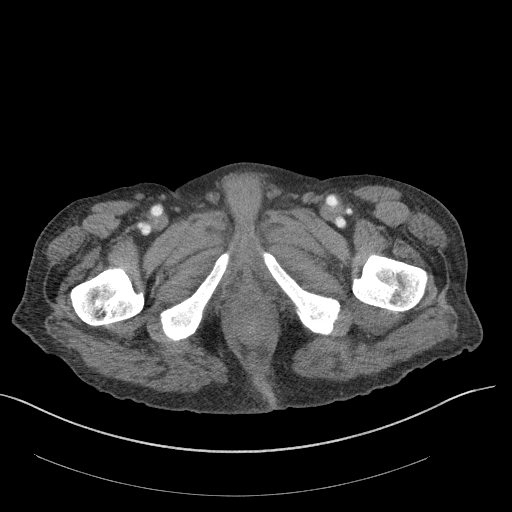
[im 6/93  bone]
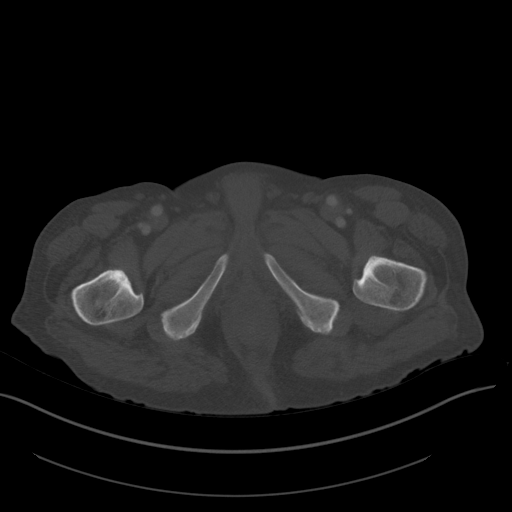
[im 18/93  soft-tissue]
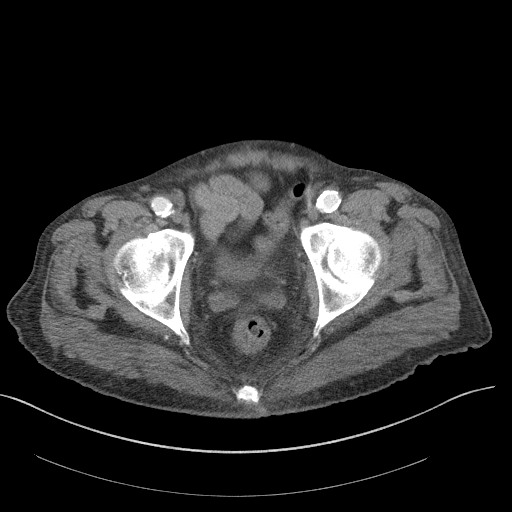
[im 24/93  soft-tissue]
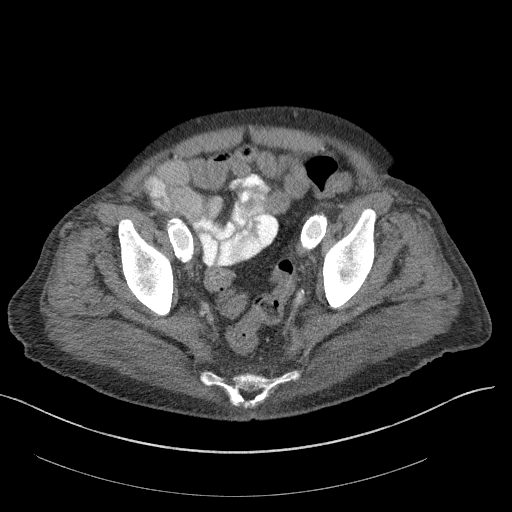
[im 29/93  soft-tissue]
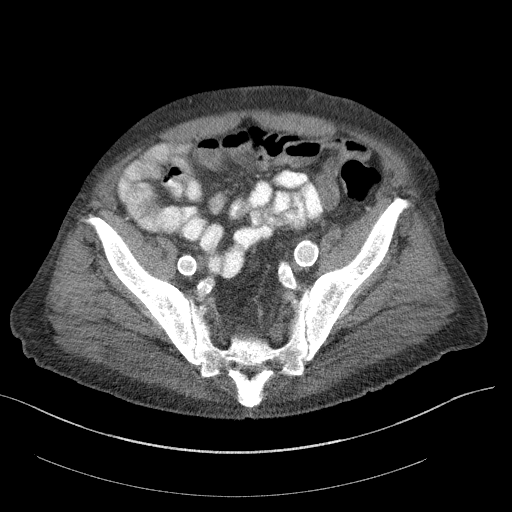
[im 41/93  soft-tissue]
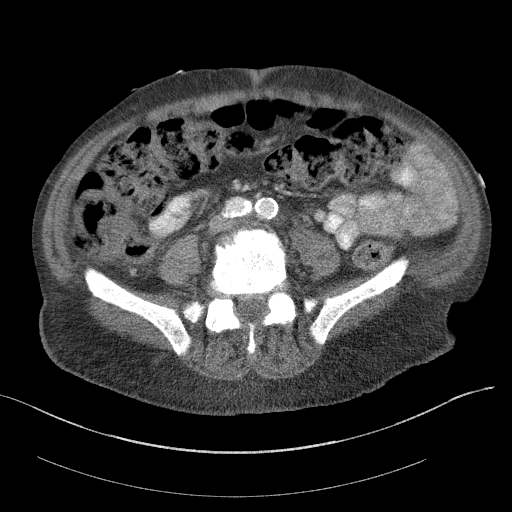
[im 47/93  soft-tissue]
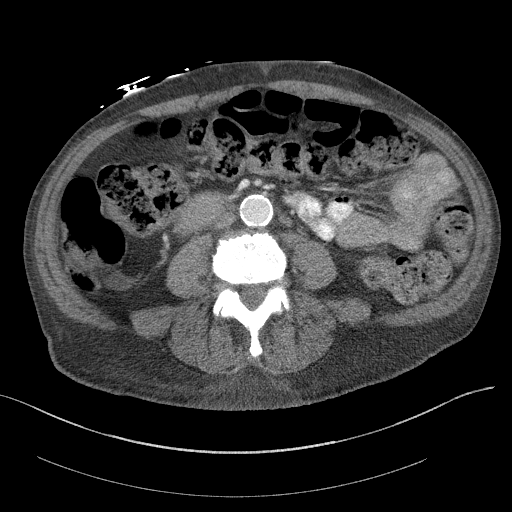
[im 52/93  soft-tissue]
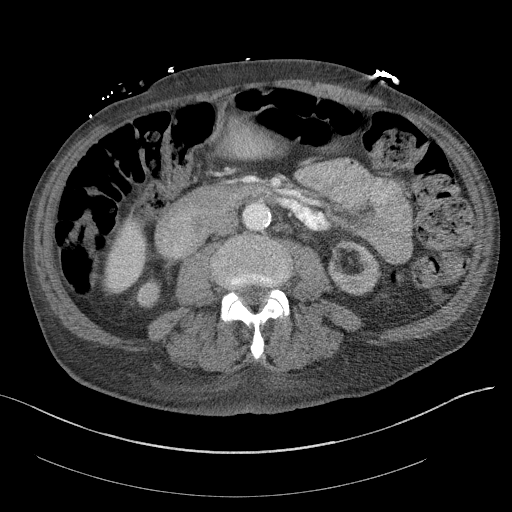
[im 64/93  soft-tissue]
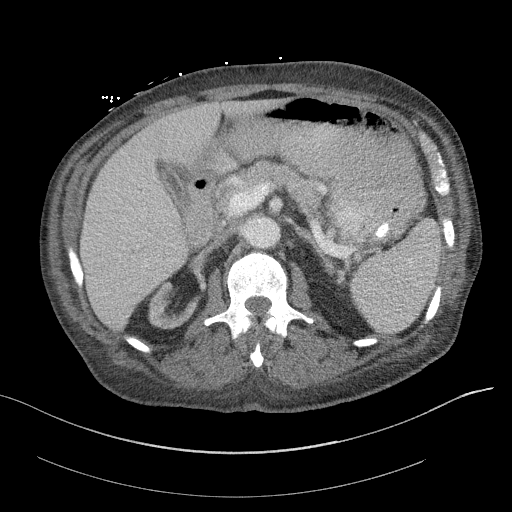
[im 70/93  soft-tissue]
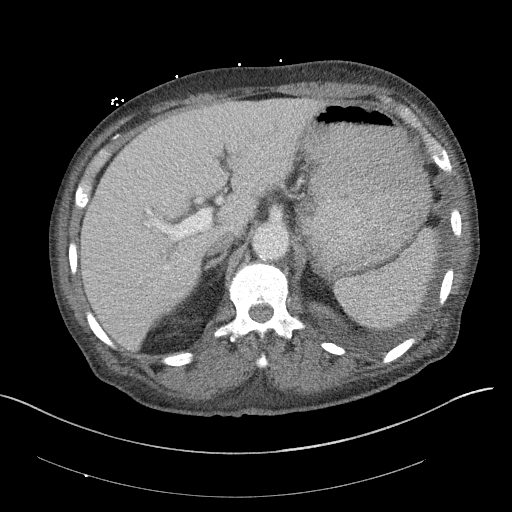
[im 70/93  bone]
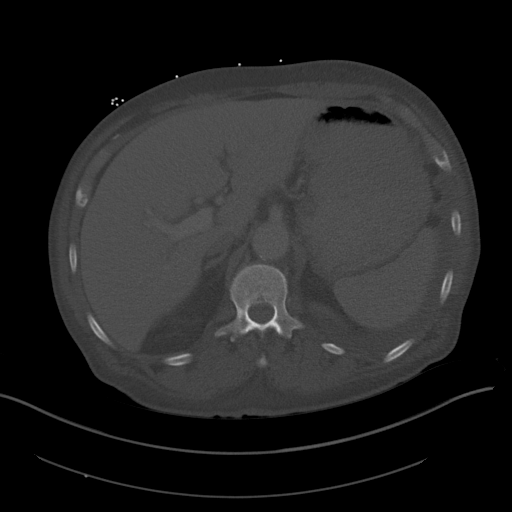
[im 75/93  soft-tissue]
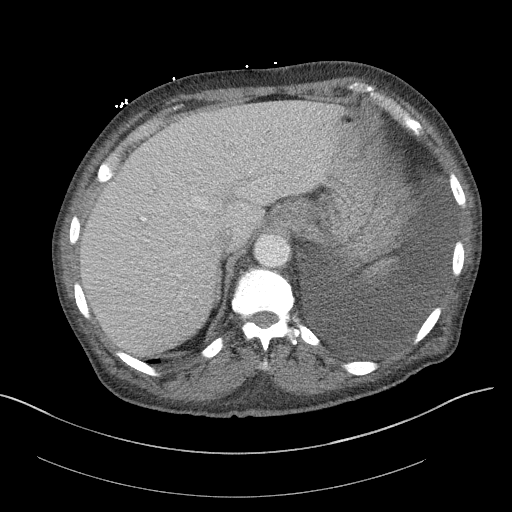
[im 87/93  soft-tissue]
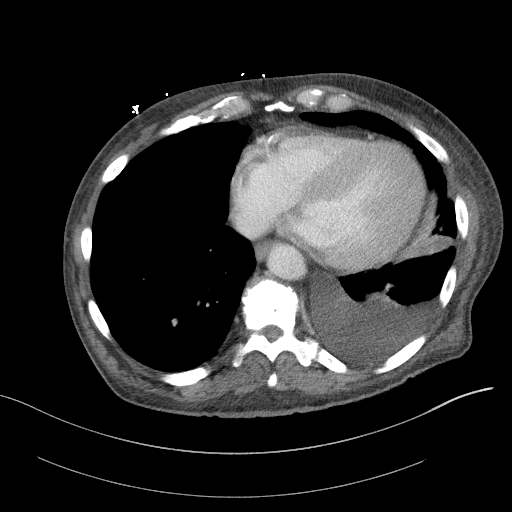

[Series 6: a/p w/ cor · coronal · 0.80mm/px · 3 of 149 slices shown]
[im 50/149  soft-tissue]
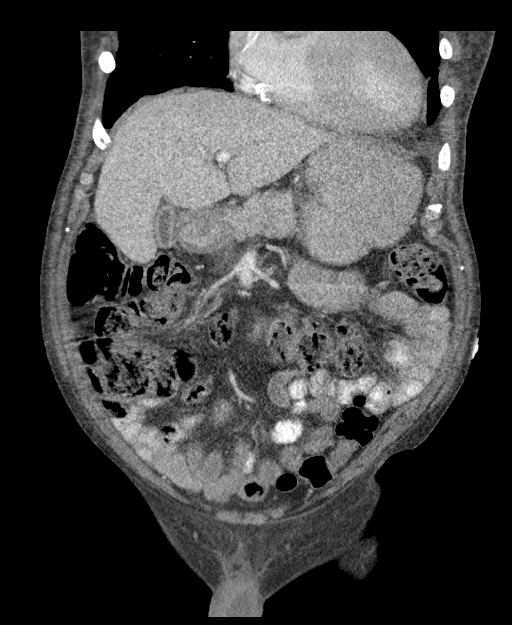
[im 66/149  soft-tissue]
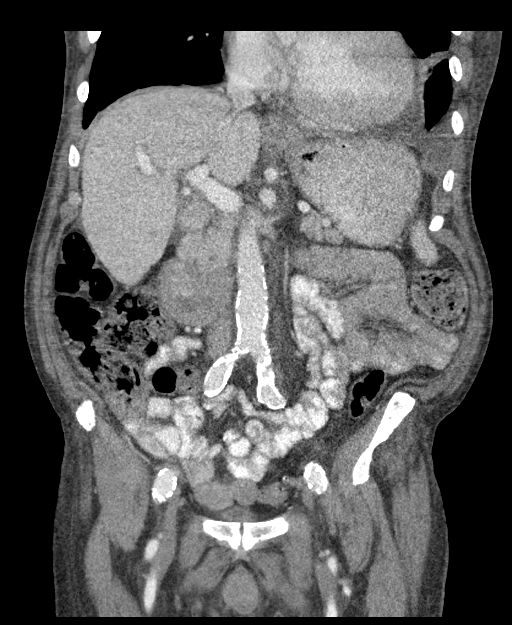
[im 83/149  soft-tissue]
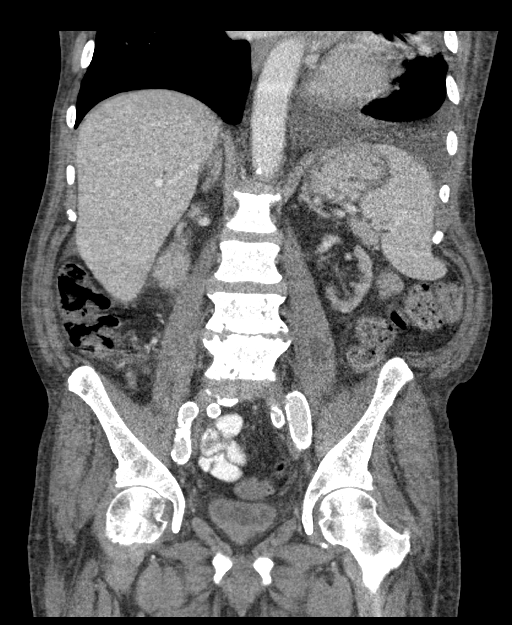

[14 of 46 positions shown; findings below may reference images not displayed]

FINDINGS: Lower chest: The moderate left effusion is stable. Opacity in the
left base and lingula is likely atelectasis. The 8 mm nodule in the
left base is stable. A 7 mm nodule in the medial right base on
series 4, image 12 is stable. No other abnormalities identified
within the lung bases.

Hepatobiliary: The portal vein is patent. Periportal edema is again
identified. No focal liver mass is noted. The gallbladder is
decompressed with a single stone near the neck. Mild prominence of
the gallbladder wall could be due to poor distention. There is
increased attenuation and a small amount of fluid adjacent to the
gallbladder. However, the significance of this finding is unclear
given ascites elsewhere.

Pancreas: The pancreas is normal.

Spleen: Normal in size without focal abnormality.

Adrenals/Urinary Tract: Adrenal glands are normal. The kidneys are
atrophic but stable. A probable cyst is seen in the left kidney,
unchanged. No hydronephrosis. No ureteral stones. The bladder is
decompressed but otherwise unremarkable.

Stomach/Bowel: The distal esophagus and stomach are normal. The
proximal third portion of the duodenum is mildly prominent in
caliber. By report, the previously identified polyp has been
removed. Low-attenuation in the proximal third portion of the
duodenum may represent ingested material. There is no evidence of
obstruction. It would be difficult to evaluate for occult hemorrhage
into the duodenum given recent follow-up removal. The remainder of
the small bowel is normal. A metallic foreign body is seen in the
sigmoid colon, likely ingested material of uncertain etiology. There
is moderate fecal loading in the colon, decreased in the interval.
Visualized appendix is normal.

Vascular/Lymphatic: Dense atherosclerosis is seen in the
nonaneurysmal aorta, iliac vessels, and femoral vessels. No
adenopathy.

Reproductive: Prostate is unremarkable.

Other: There is a small amount of ascites in the abdomen,
particularly adjacent to the inferior liver and gallbladder. No
suspicious fluid collections. Increased attenuation in the
subcutaneous fat is suspicious for volume overload. No free air.

Musculoskeletal: Degenerative changes in the visualized spine.
Increased sclerosis in the bones, likely due to renal failure. No
acute abnormalities in the bones.
IMPRESSION: 1. No cause for the patient's anemia is identified.
2. There is mild prominence the gallbladder wall and mild
pericholecystic fluid. The significance of these findings is unclear
given ascites elsewhere and signs of volume overload. There is a
stone in the neck of the gallbladder which is stable. An ultrasound
could better evaluate the gallbladder if there is concern.
3. Stable moderate effusion in the left base with atelectasis.
4. Stable pulmonary nodules in the lung bases. The 8 mm nodule on
the left is the larger of the 2 nodules. These nodules are stable
since July 2016. A final follow-up in 1 year could ensure
greater than 2 years of stability.
5. Dense atherosclerosis in the aorta and branching vessels.
6. Metallic foreign body in the sigmoid colon, likely ingested
material.
7. Small ascites in the abdomen.
8. The proximal third portion of the duodenum is mildly prominent in
caliber, of uncertain significance. No overt obstruction.

## 2019-10-03 IMAGING — DX DG CHEST 2V
2 series · 2 of 2 positions shown · non-contrast
Comparison: 03/07/2018

CLINICAL DATA: Shortness of breath this morning. Patient does home
hemodialysis.

EXAM:
CHEST - 2 VIEW

[chest pa]
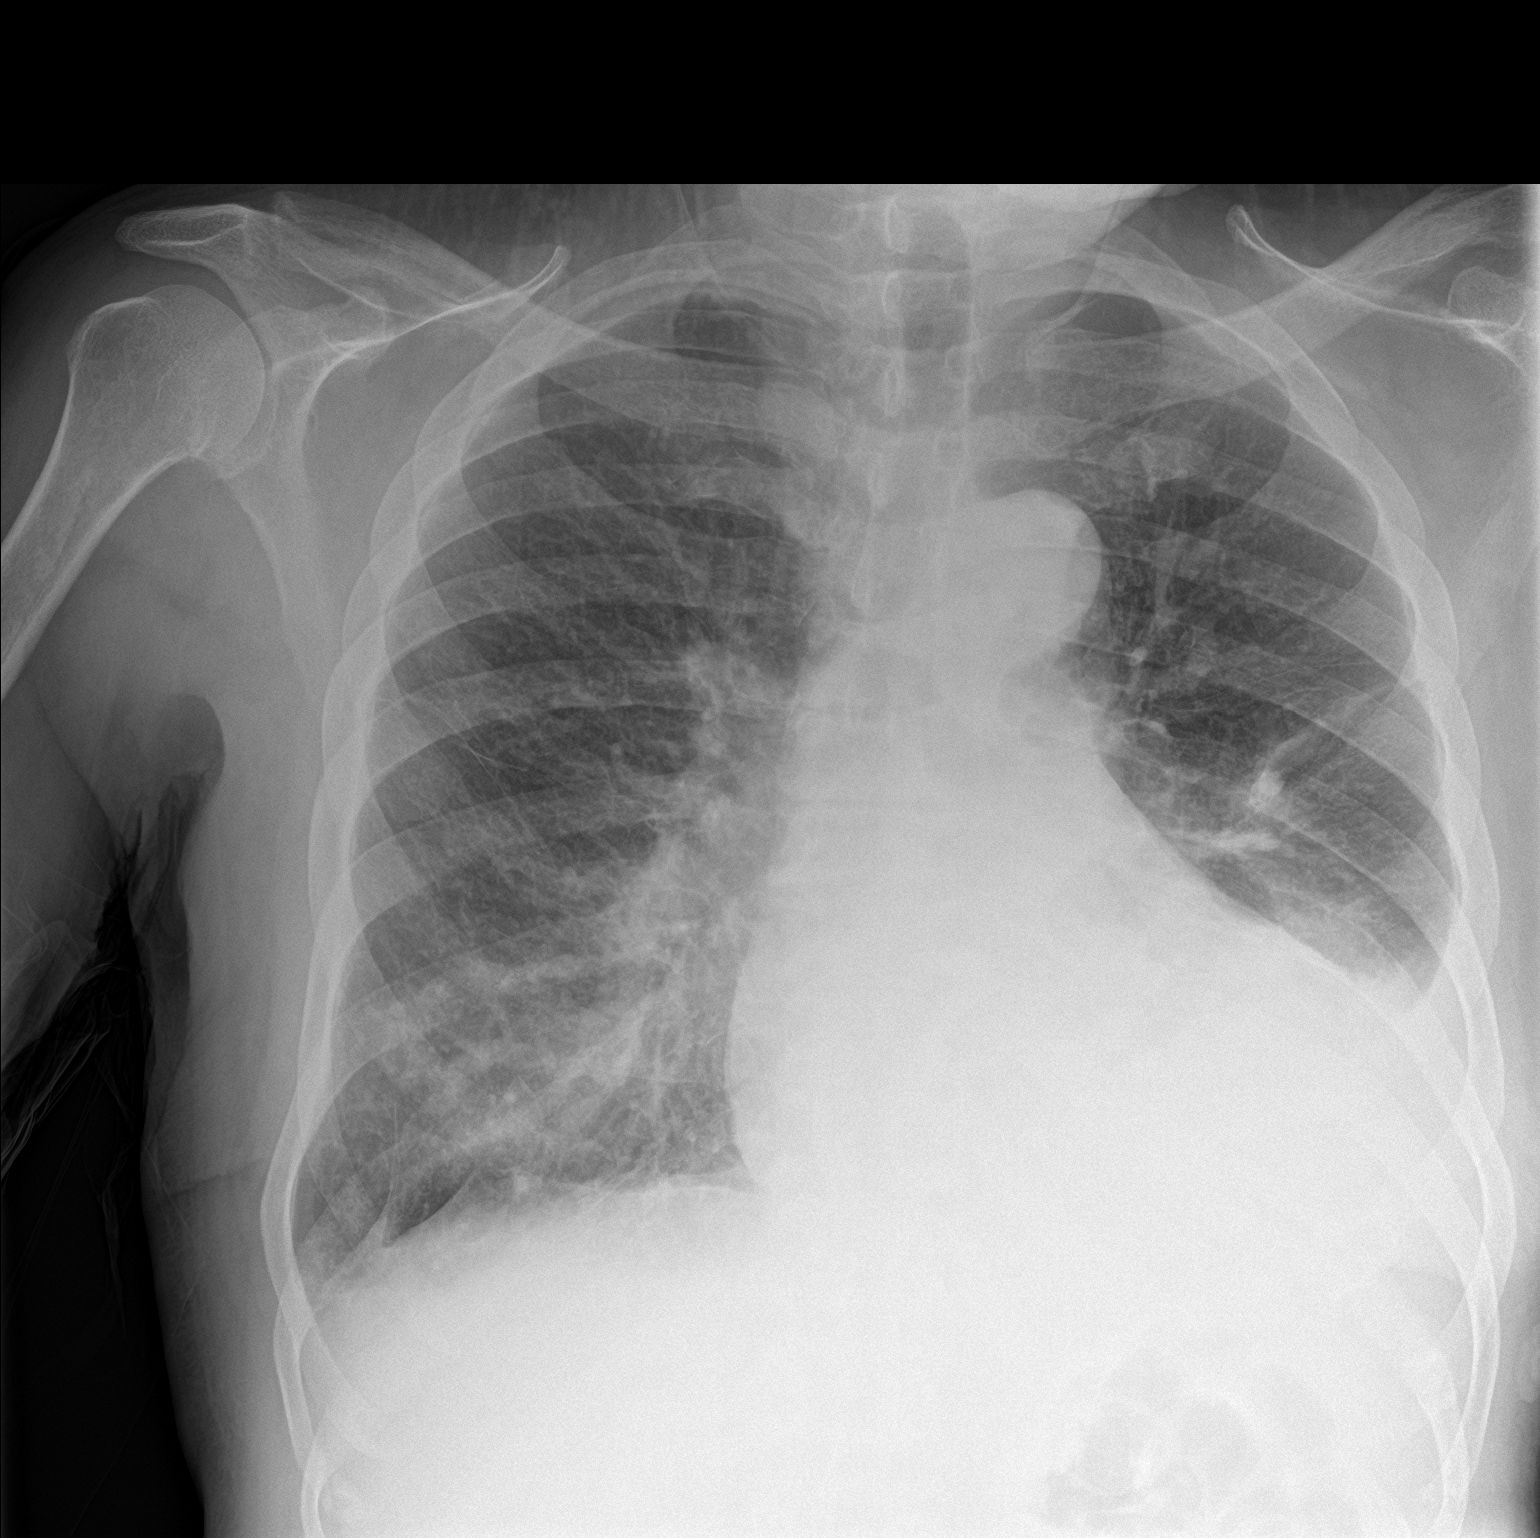

[chest lat]
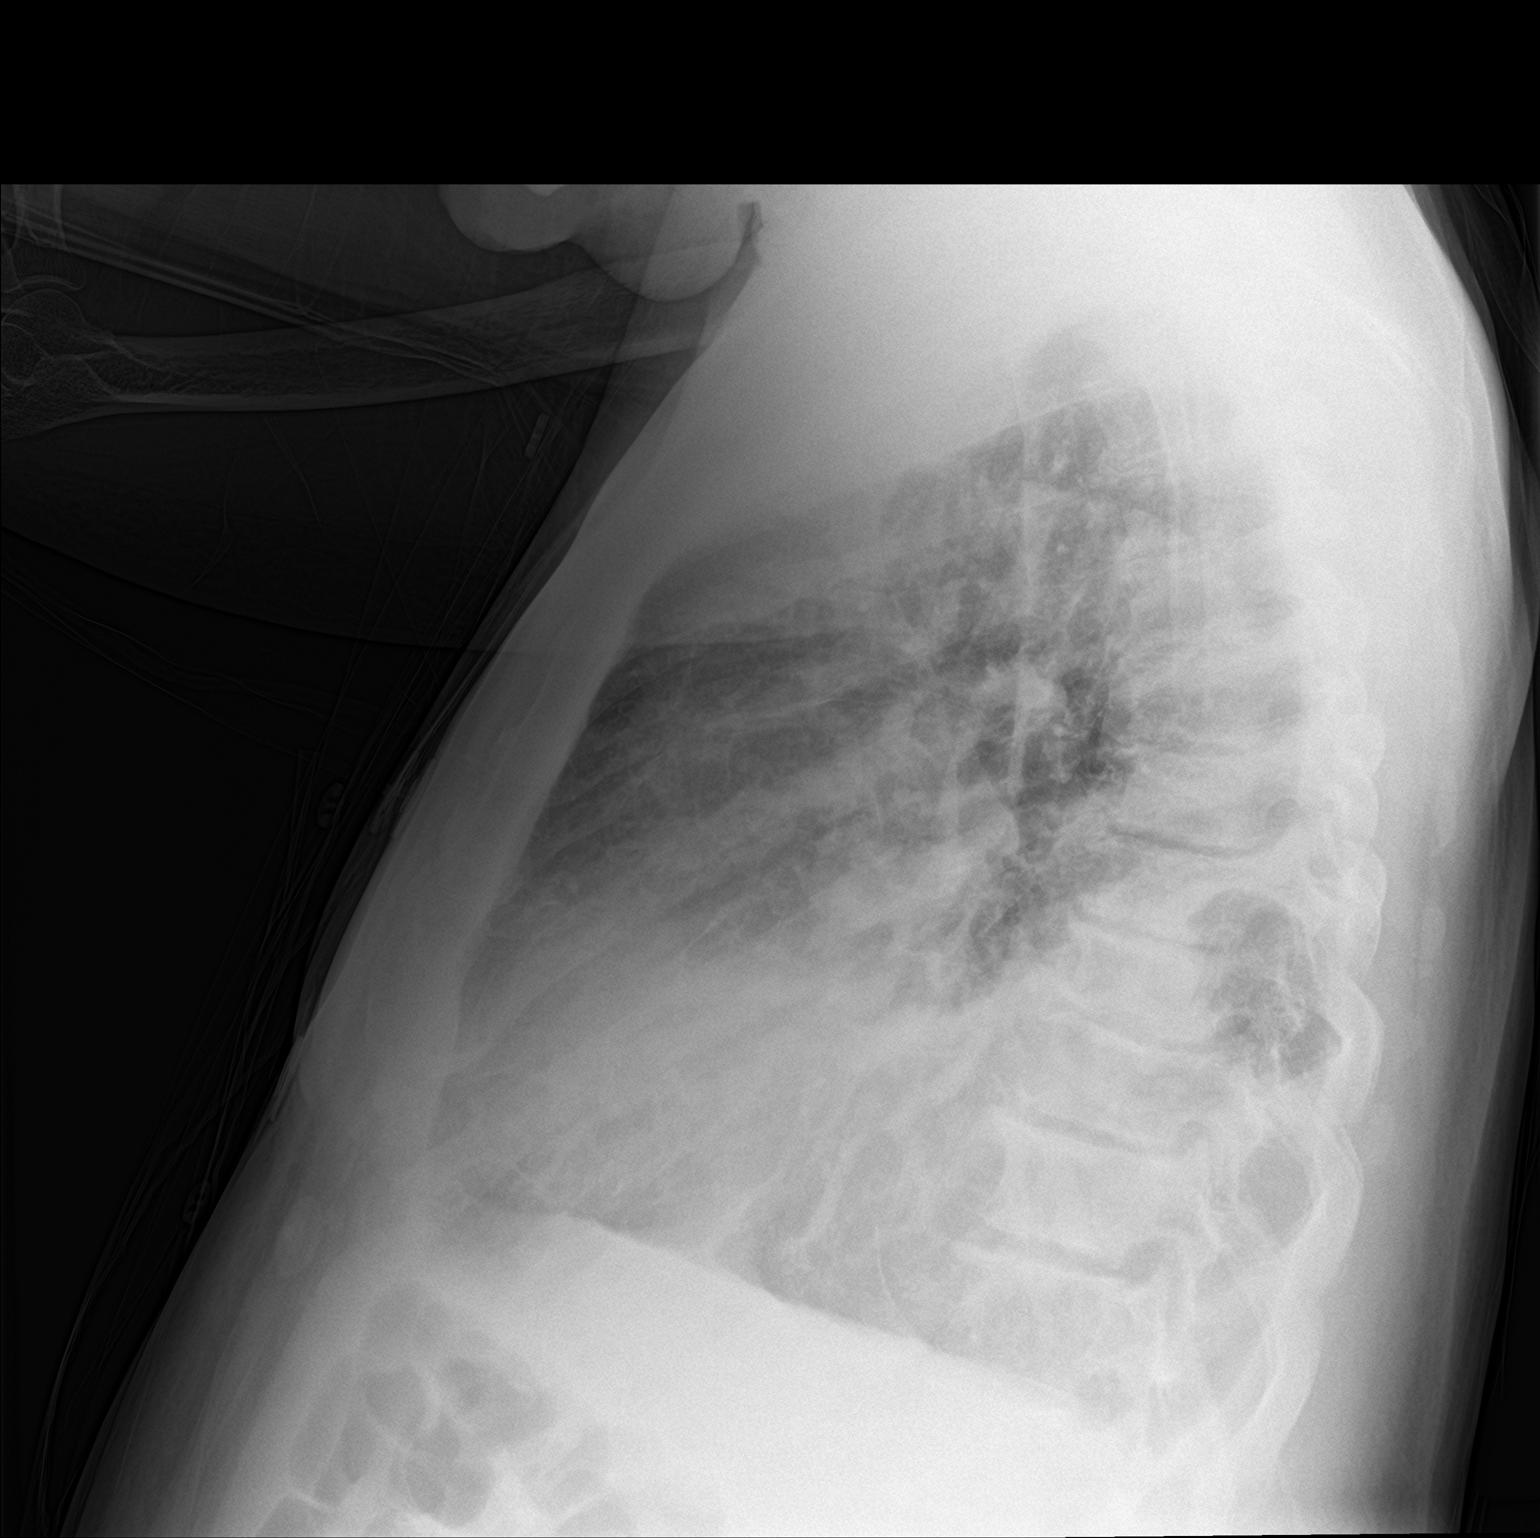

[2 of 2 positions shown; findings below may reference images not displayed]

FINDINGS: Lungs are adequately inflated demonstrate development of a moderate
size left pleural effusion likely associated atelectasis. Hazy
density over the right base with prominence of the perihilar
markings bilaterally likely mild interstitial edema. Infection in
the lung bases is possible. Mild stable cardiomegaly. Remainder of
the exam is unchanged.
IMPRESSION: Development of a moderate size left pleural effusion likely with
associated basilar atelectasis.

Stable cardiomegaly with mild interstitial edema. Infection in the
lung bases is possible.

## 2019-10-04 IMAGING — CT CT HEAD W/O CM
4 series · 15 of 47 positions shown, 17 images · non-contrast
Comparison: Brain MRI 04/11/2018, CT brain 02/21/2018

CLINICAL DATA: New onset seizure

EXAM:
CT HEAD WITHOUT CONTRAST
TECHNIQUE: Contiguous axial images were obtained from the base of the skull
through the vertex without intravenous contrast.

[Series 3: head without · axial · non-contrast · 0.49mm/px · z∈[-92,+38]mm · 7 of 36 slices shown, 9 images]
[im 5/36  brain]
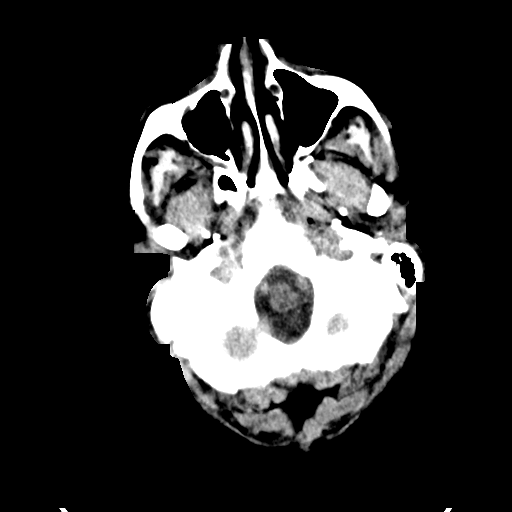
[im 5/36  bone]
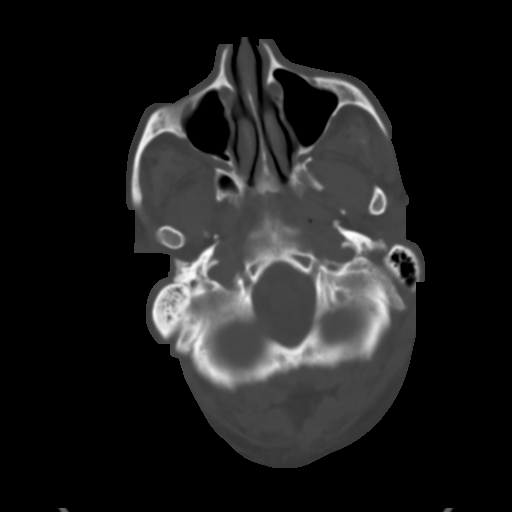
[im 9/36  brain]
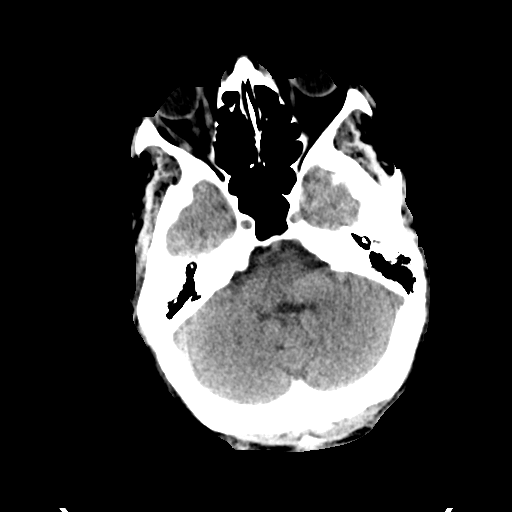
[im 14/36  brain]
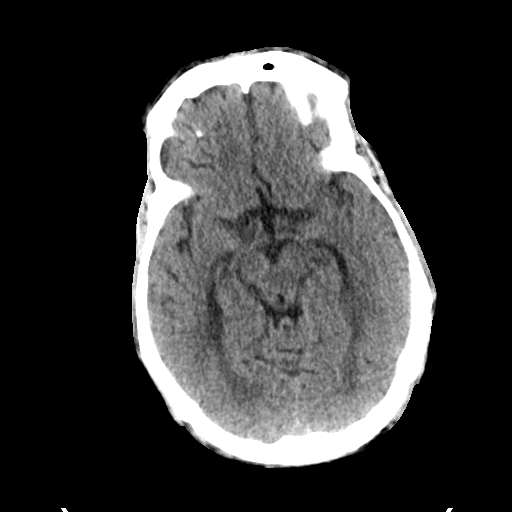
[im 18/36  brain]
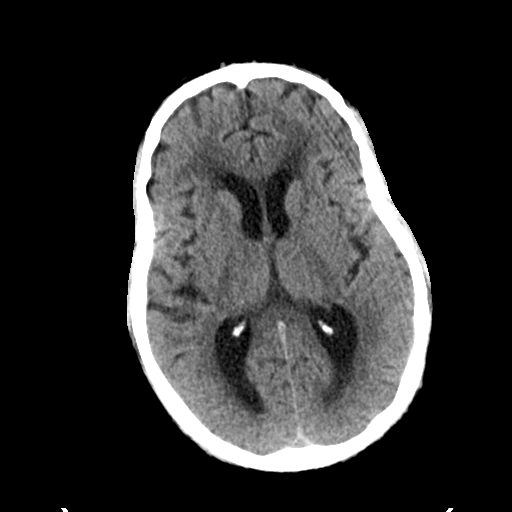
[im 22/36  brain]
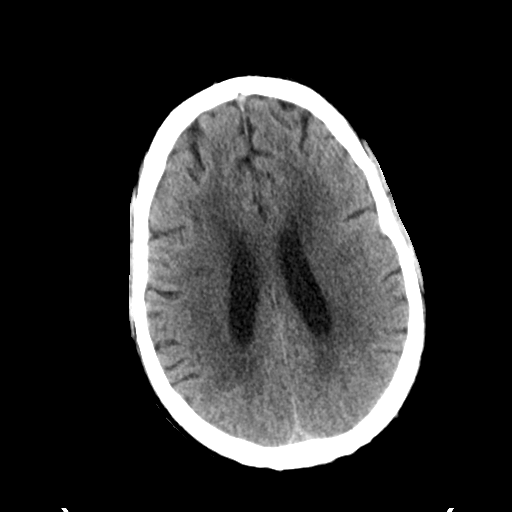
[im 22/36  bone]
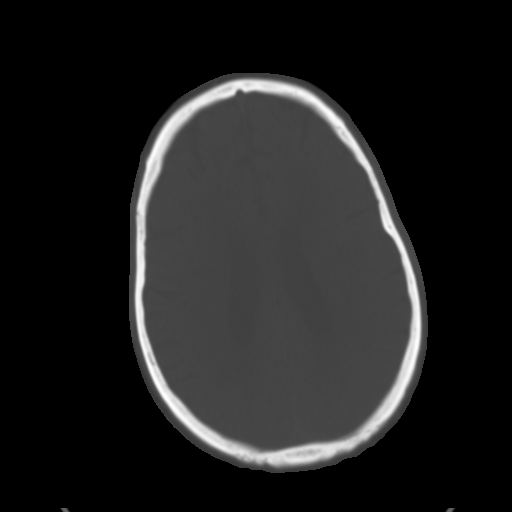
[im 27/36  brain]
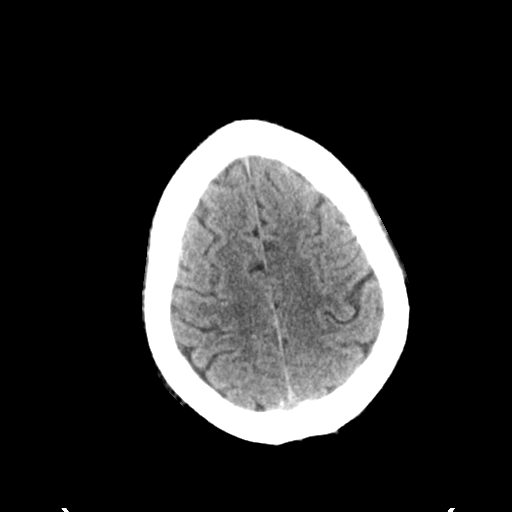
[im 31/36  brain]
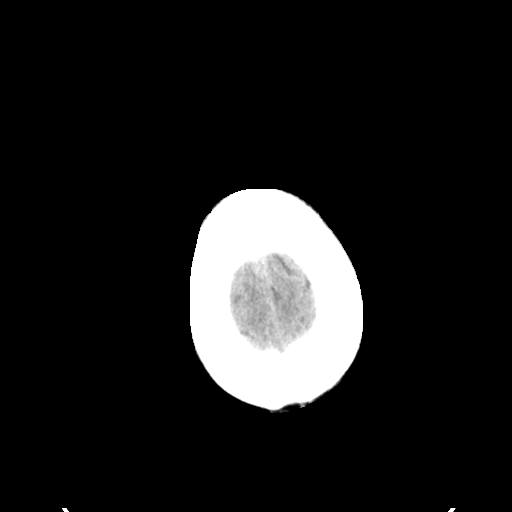

[Series 4: head bone · axial · 0.49mm/px · z∈[-96,-78]mm · 2 of 88 slices shown]
[im 9/88  bone]
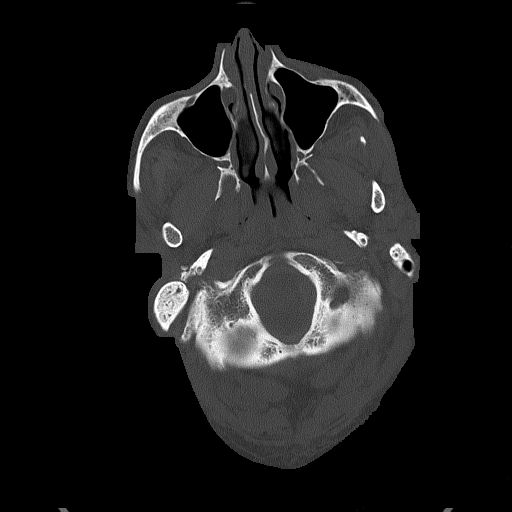
[im 18/88  bone]
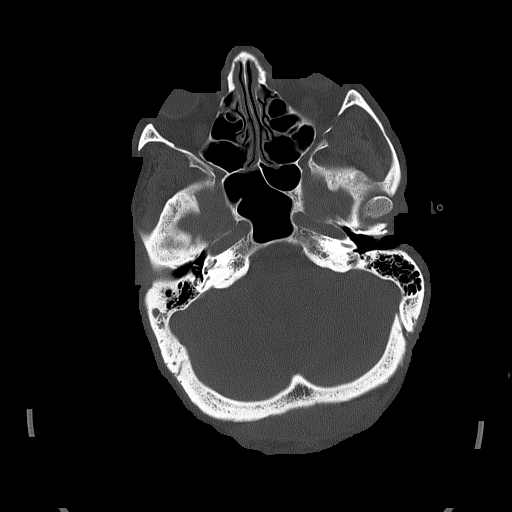

[Series 5: head without cor · coronal · non-contrast · 0.34mm/px · 3 of 73 slices shown]
[im 25/73  brain]
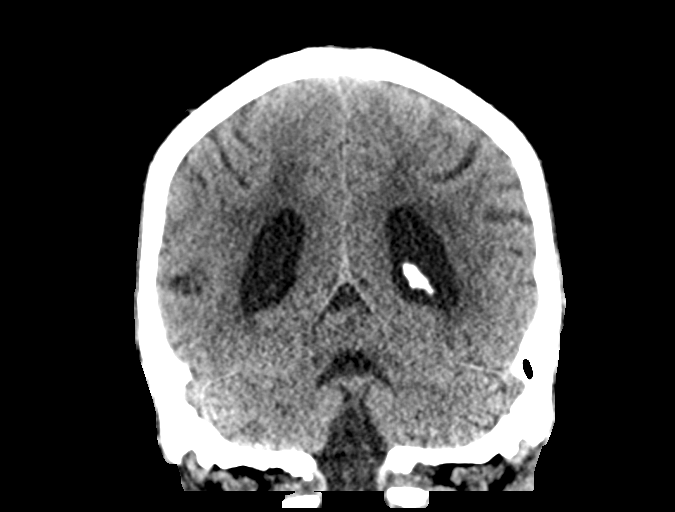
[im 33/73  brain]
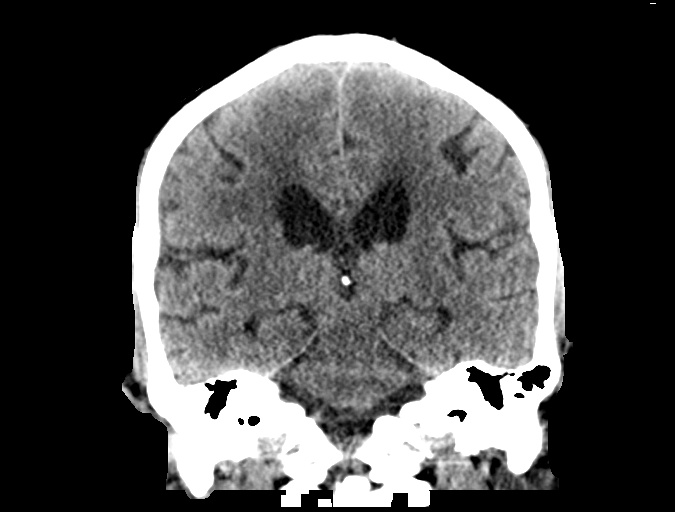
[im 41/73  brain]
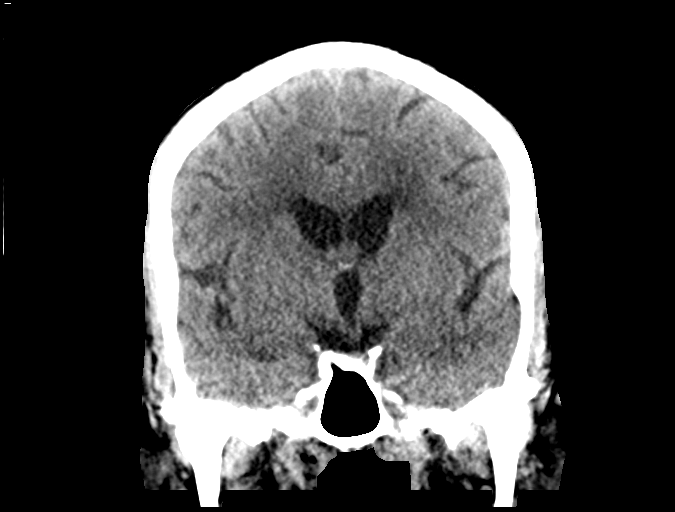

[Series 6: head without sag · sagittal · non-contrast · 0.34mm/px · 3 of 63 slices shown]
[im 21/63  brain]
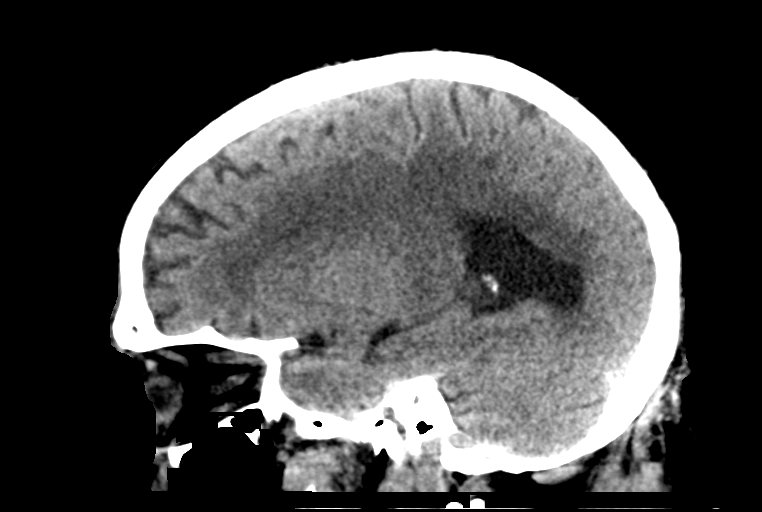
[im 32/63  brain]
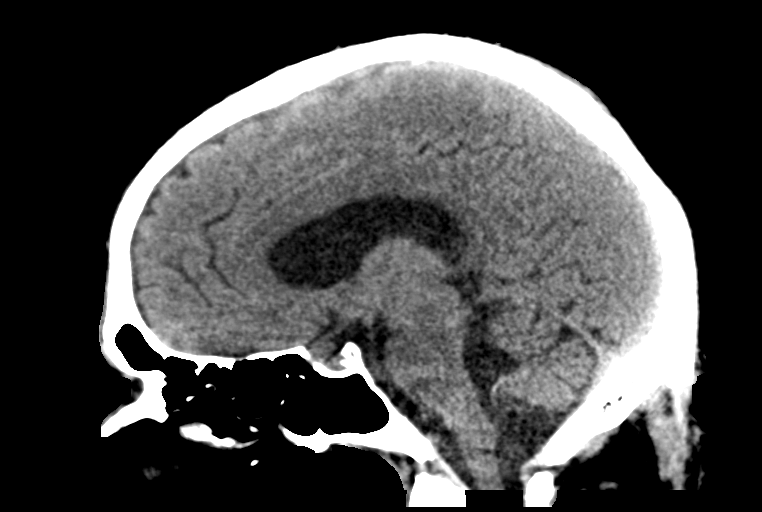
[im 42/63  brain]
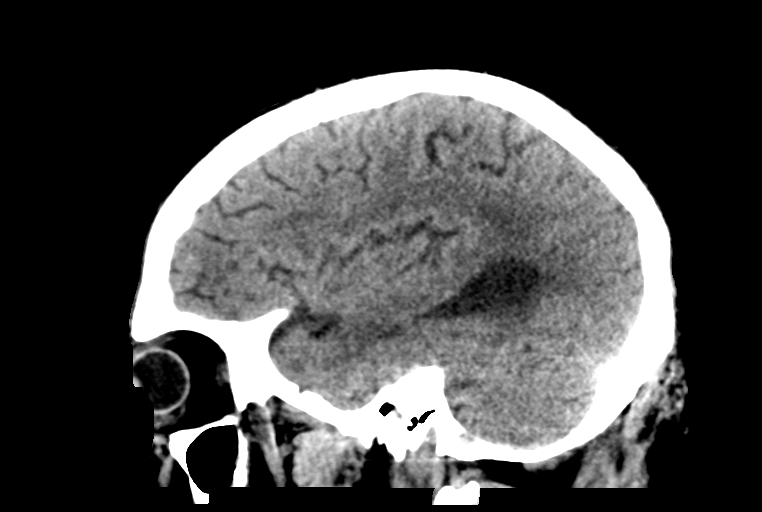

[15 of 47 positions shown; findings below may reference images not displayed]

FINDINGS: Brain: No acute territorial infarction, hemorrhage or intracranial
mass. Moderate small vessel ischemic changes of the white matter.
Stable ventricle size

Vascular: No hyperdense vessels. Scattered carotid vascular
calcification.

Skull: Normal. Negative for fracture or focal lesion.

Sinuses/Orbits: No acute finding.

Other: None
IMPRESSION: 1. No CT evidence for acute intracranial abnormality.
2. Moderate small vessel ischemic changes of the white matter

## 2019-10-04 IMAGING — MR MR HEAD W/O CM
12 of 13 series · 43 of 48 positions shown · non-contrast
Comparison: MRI of the head February 23, 2018.

CLINICAL DATA: New onset seizures. History of end-stage renal
disease on dialysis, HIV, hepatitis B.

EXAM:
MRI HEAD WITHOUT CONTRAST
TECHNIQUE: Multiplanar, multiecho pulse sequences of the brain and surrounding
structures were obtained without intravenous contrast.

[Series 5: ax dwi_tracew · axial · 3.0mm · 1.50mm/px · z∈[-82,+69]mm · 7 of 80 slices shown]
[im 1/80]
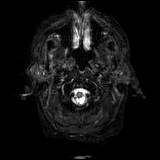
[im 14/80]
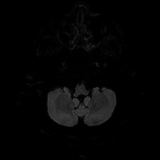
[im 27/80]
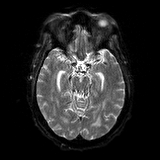
[im 40/80]
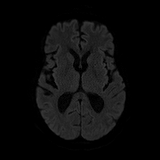
[im 53/80]
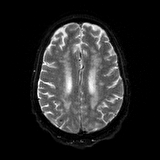
[im 66/80]
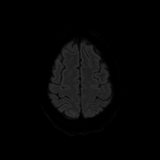
[im 80/80]
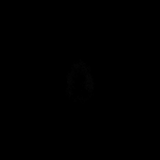

[Series 6: ax dwi_adc · axial · 3.0mm · 1.50mm/px · z∈[-82,+69]mm · 4 of 40 slices shown]
[im 1/40]
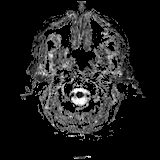
[im 14/40]
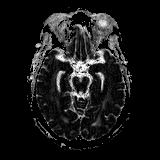
[im 27/40]
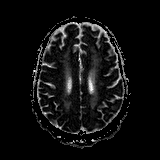
[im 40/40]
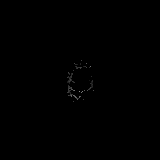

[Series 7: cor dwi_tracew · coronal · 5.0mm · 1.44mm/px · 6 of 70 slices shown]
[im 1/70]
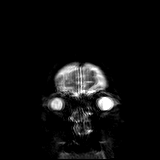
[im 14/70]
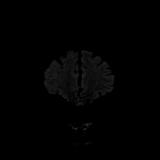
[im 28/70]
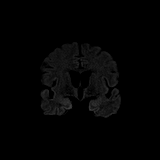
[im 42/70]
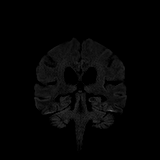
[im 56/70]
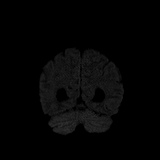
[im 70/70]
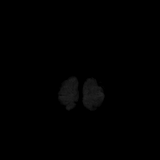

[Series 8: cor dwi_adc · coronal · 5.0mm · 1.44mm/px · 3 of 35 slices shown]
[im 1/35]
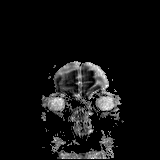
[im 18/35]
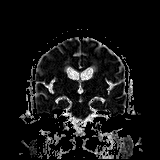
[im 35/35]
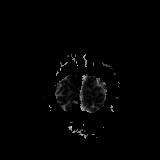

[Series 9: T1 · sagittal · 5.0mm · 0.75mm/px · 2 of 24 slices shown]
[im 1/24]
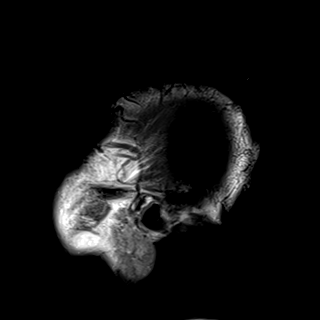
[im 24/24]
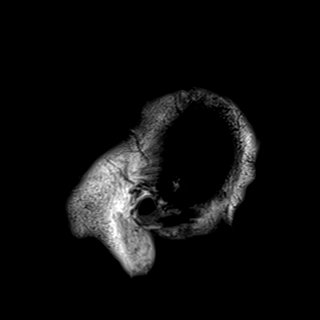

[Series 10: T2 · axial · 5.0mm · 0.72mm/px · z∈[-87,+55]mm · 2 of 25 slices shown (1 of 3)]
[im 1/25]
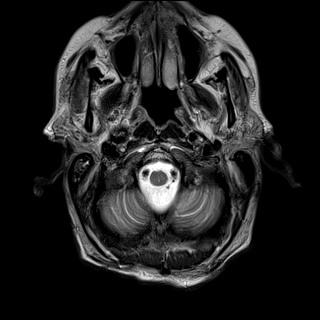
[im 25/25]
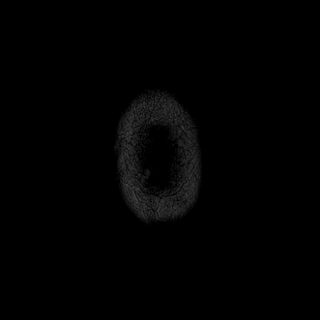

[Series 11: FLAIR · axial · 5.0mm · 0.45mm/px · z∈[-85,+57]mm · 2 of 25 slices shown (1 of 2)]
[im 1/25]
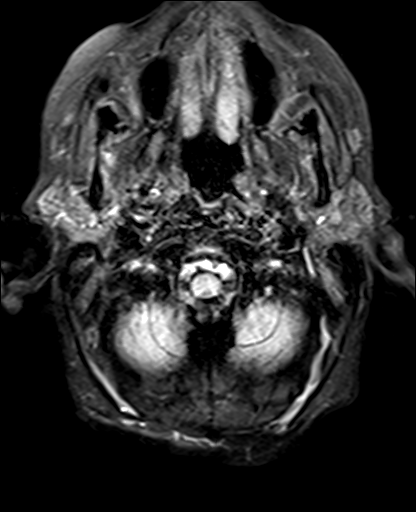
[im 25/25]
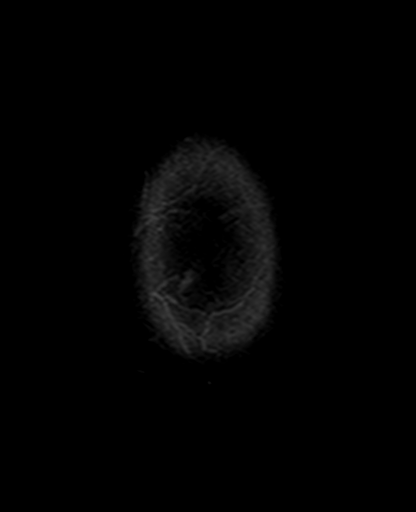

[Series 12: swi_images · axial · 3.0mm · 0.90mm/px · z∈[-90,+61]mm · 5 of 52 slices shown]
[im 1/52]
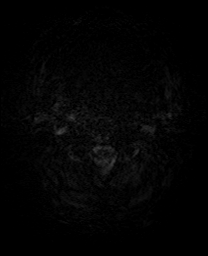
[im 13/52]
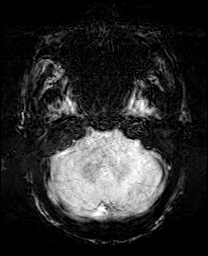
[im 26/52]
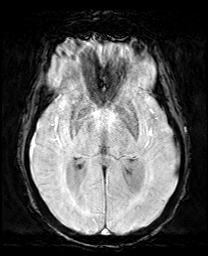
[im 39/52]
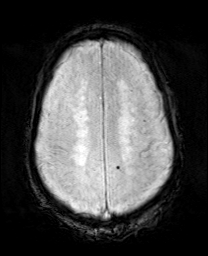
[im 52/52]
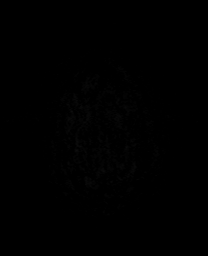

[Series 13: mip_images(sw) · axial · 24.0mm · 0.90mm/px · z∈[-80,+51]mm · 4 of 45 slices shown]
[im 1/45]
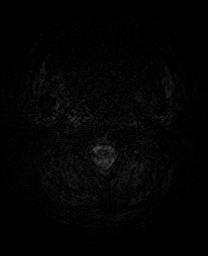
[im 15/45]
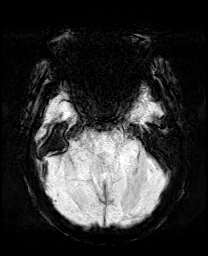
[im 30/45]
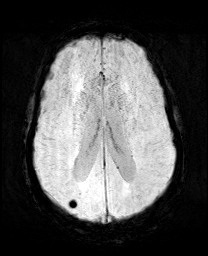
[im 45/45]
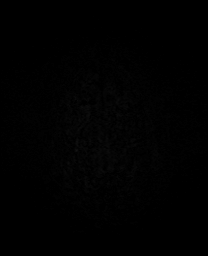

[Series 15: T2 · coronal · 3.0mm · 0.27mm/px · 3 of 32 slices shown (2 of 3)]
[im 1/32]
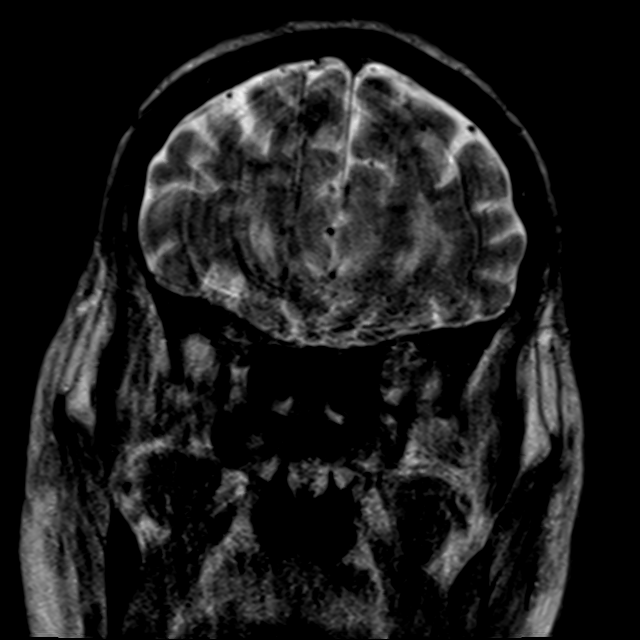
[im 16/32]
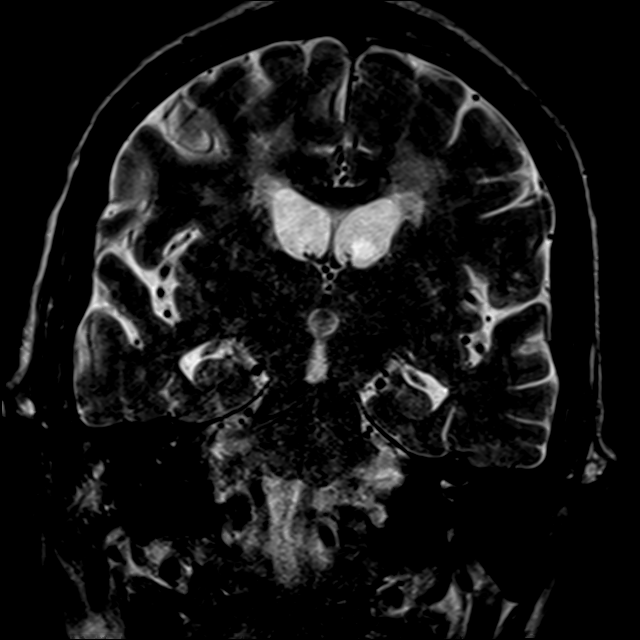
[im 32/32]
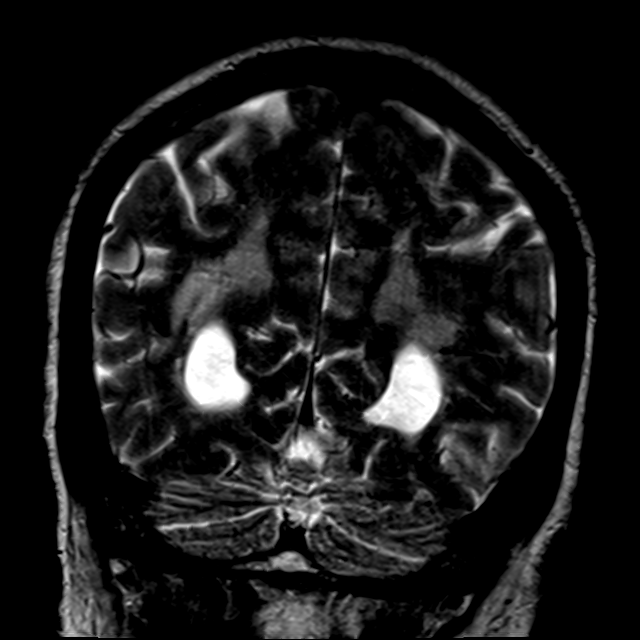

[Series 16: FLAIR · coronal · 3.0mm · 0.56mm/px · 2 of 25 slices shown (2 of 2)]
[im 1/25]
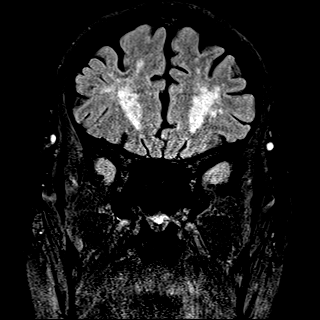
[im 25/25]
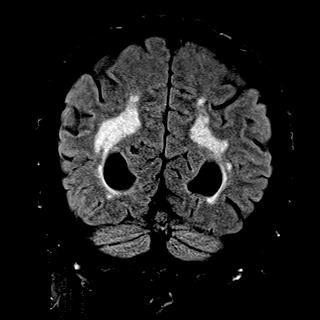

[Series 17: T2 · coronal · 5.0mm · 0.34mm/px · 3 of 31 slices shown (3 of 3)]
[im 1/31]
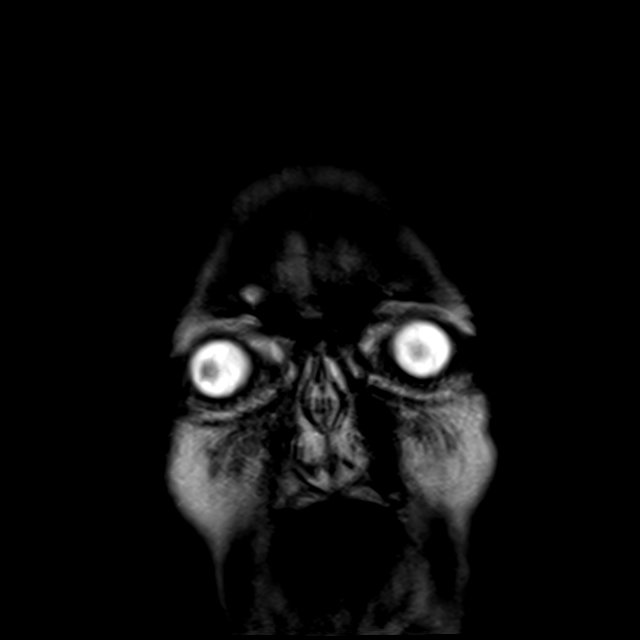
[im 16/31]
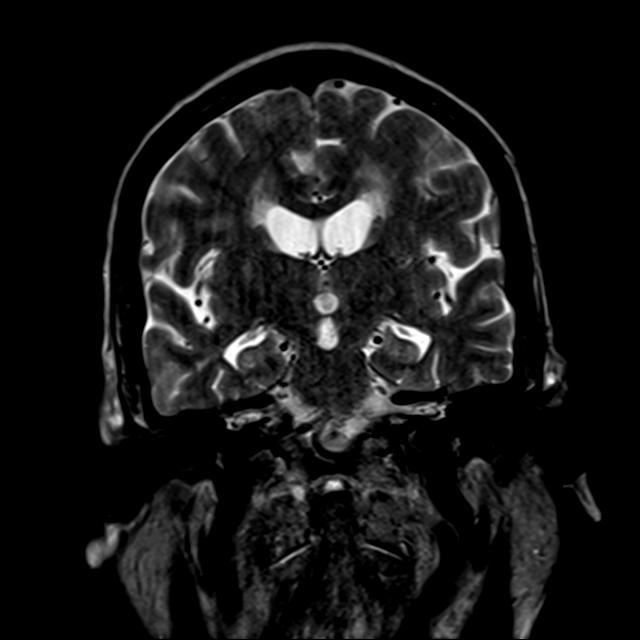
[im 31/31]
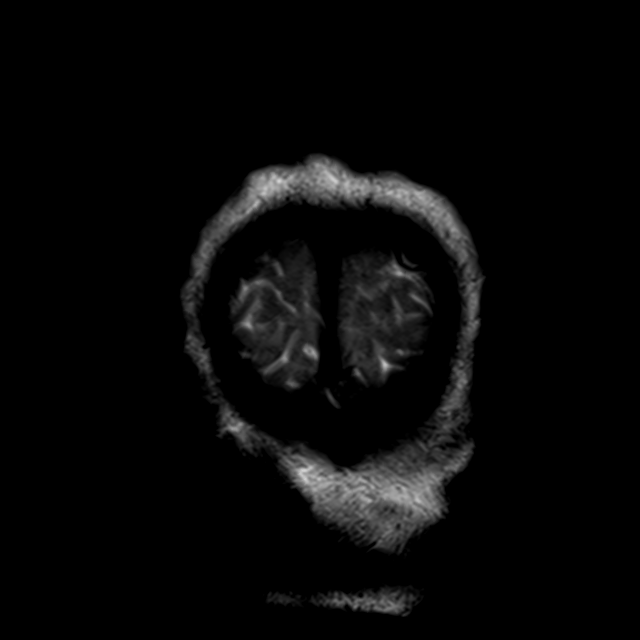

[43 of 48 positions shown; findings below may reference images not displayed]

FINDINGS: Multiple sequences are mildly motion degraded.

INTRACRANIAL CONTENTS: No reduced diffusion to suggest acute
ischemia. Numerous similar chronic microhemorrhages most compatible
with chronic hypertension. Moderate parenchymal brain volume loss.
No suspicious parenchymal signal, masses, mass effect. Confluent
supratentorial white matter FLAIR T2 hyperintensities. No abnormal
extra-axial fluid collections. No extra-axial masses. Hippocampi
demonstrate generally symmetric morphology and signal, limited by
motion degraded in size coronal T2.

VASCULAR: Normal major intracranial vascular flow voids present at
skull base.

SKULL AND UPPER CERVICAL SPINE: No abnormal sellar expansion. No
suspicious calvarial bone marrow signal. Craniocervical junction
maintained.

SINUSES/OR RIGHT mastoid effusion. BITS: The mastoid air-cells and
included paranasal sinuses are well-aerated.The included ocular
globes and orbital contents are non-suspicious.

OTHER: Patient is edentulous.
IMPRESSION: 1. No acute intracranial process.
2. Stable moderate parenchymal brain volume loss.
3. Stable moderate to severe chronic small vessel ischemic changes.

## 2019-10-10 ENCOUNTER — Ambulatory Visit: Payer: Medicare Other | Admitting: Neurology

## 2019-10-10 IMAGING — DX DG CHEST 2V
4 series · 4 of 4 positions shown · non-contrast
Comparison: 04/11/2018 chest radiograph.

CLINICAL DATA: Dyspnea

EXAM:
CHEST - 2 VIEW

[w chest lat (1 of 2)]
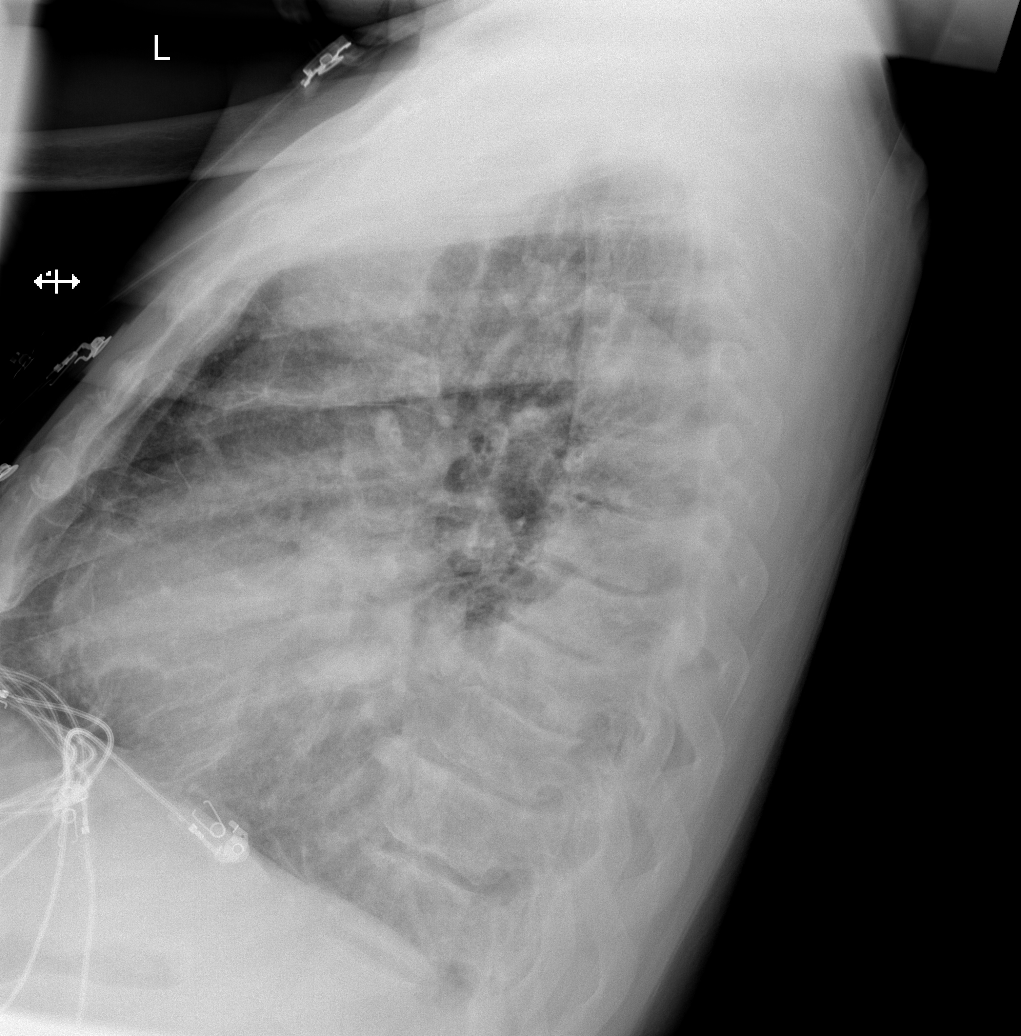

[w chest lat (2 of 2)]
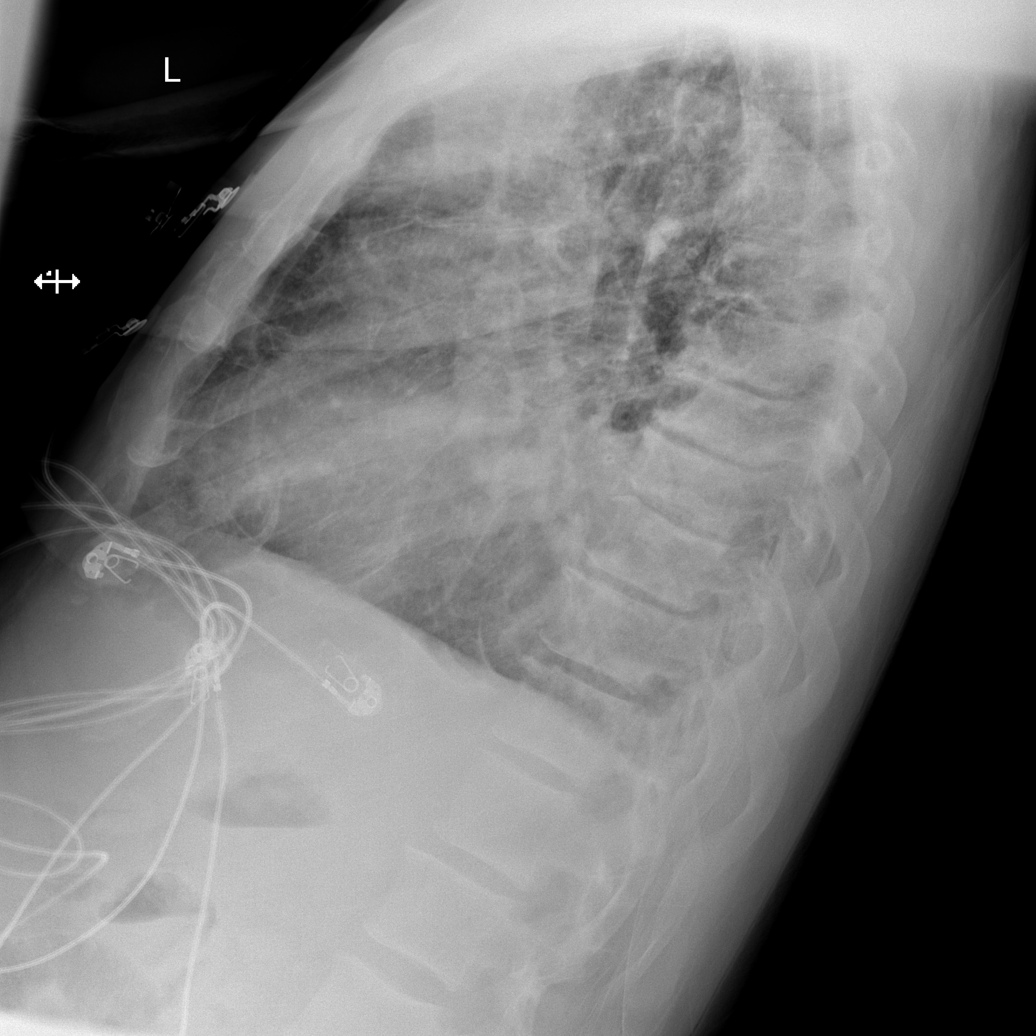

[x chest ap (1 of 2)]
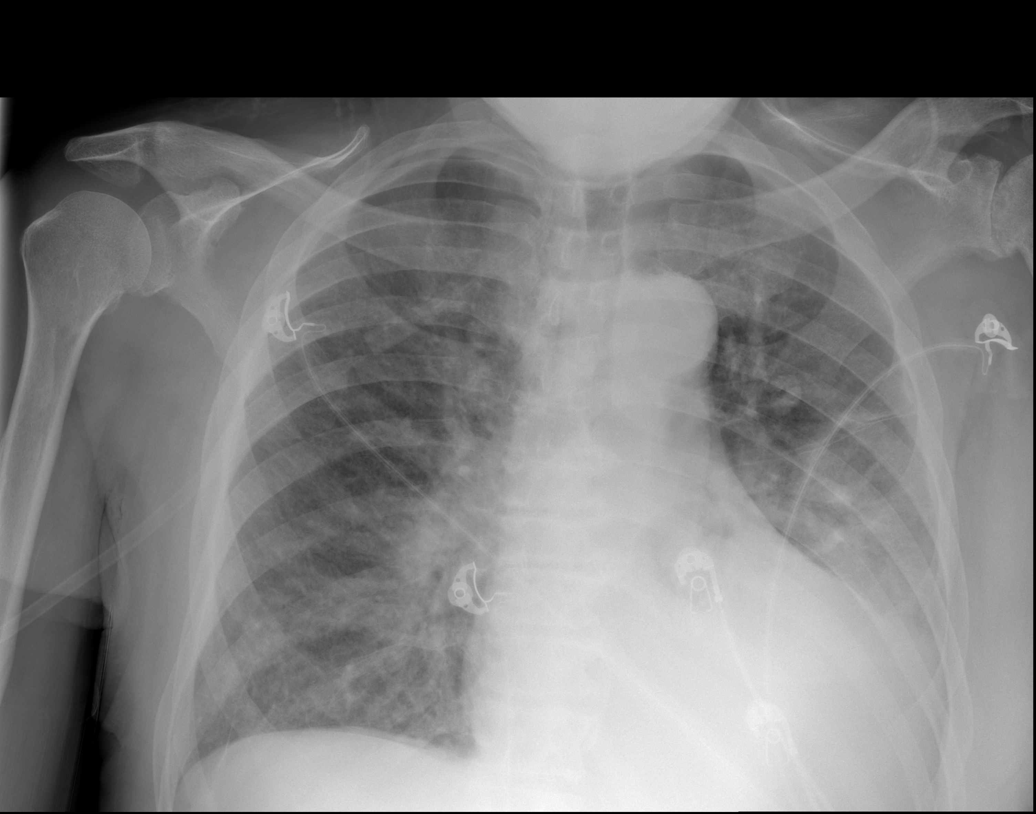

[x chest ap (2 of 2)]
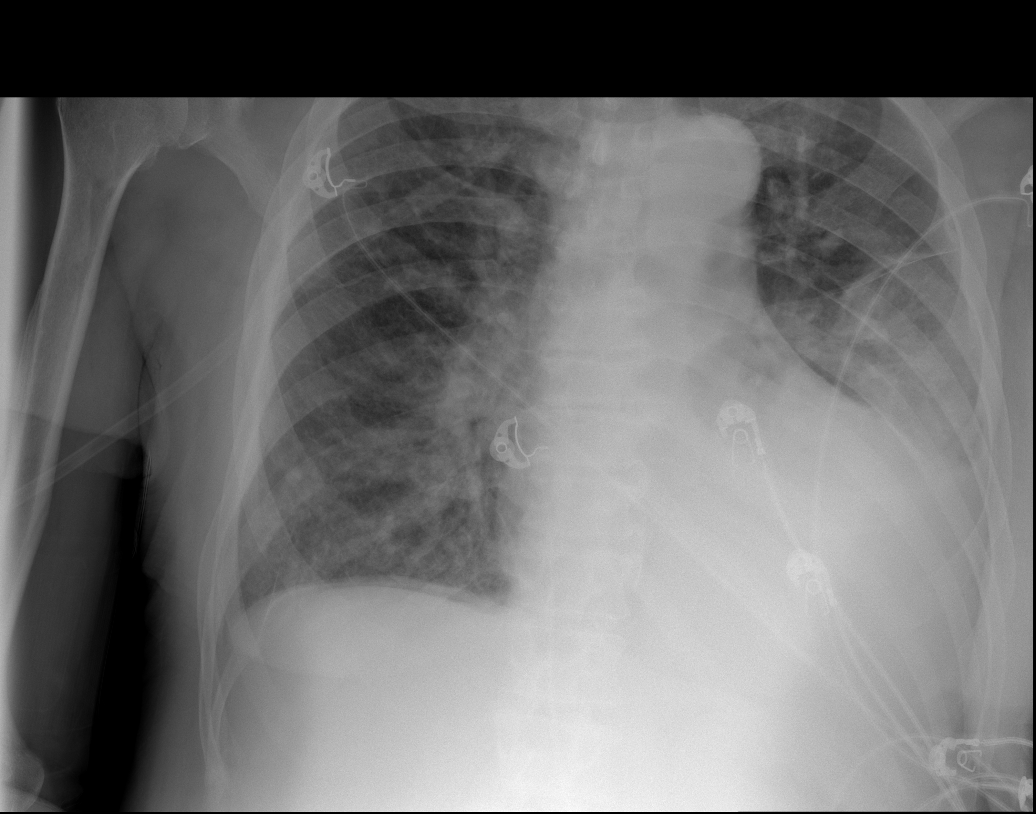

[4 of 4 positions shown; findings below may reference images not displayed]

FINDINGS: Stable cardiomediastinal silhouette with mild cardiomegaly. No
pneumothorax. Small left pleural effusion. No right pleural
effusion. Mild pulmonary edema. Left lung base consolidation. Right
lung hyperinflation.
IMPRESSION: 1. Cardiomegaly with mild pulmonary edema, suggesting congestive
heart failure.
2. Small left pleural effusion.
3. Left lung base consolidation, favor atelectasis.
4. Hyperinflated right lung, suggesting underlying obstructive lung
disease.

## 2019-10-13 IMAGING — XA IR AV DIALY SHUNT INTRO NEEDLE/INTRAC INITIAL W/PTA/STENT/IMG*L*
1 series · 14 of 24 positions shown · IV contrast (IODINE)
Comparison: Previous fistulogram on 12/25/2011

CLINICAL DATA: Prolonged bleeding from left forearm dialysis AV
fistula.

EXAM:
DIALYSIS AV FISTULOGRAM

[Series 300: ir av dialy shunt intro needle/intrac in · 14 of 30 slices shown]
[im 1/30]
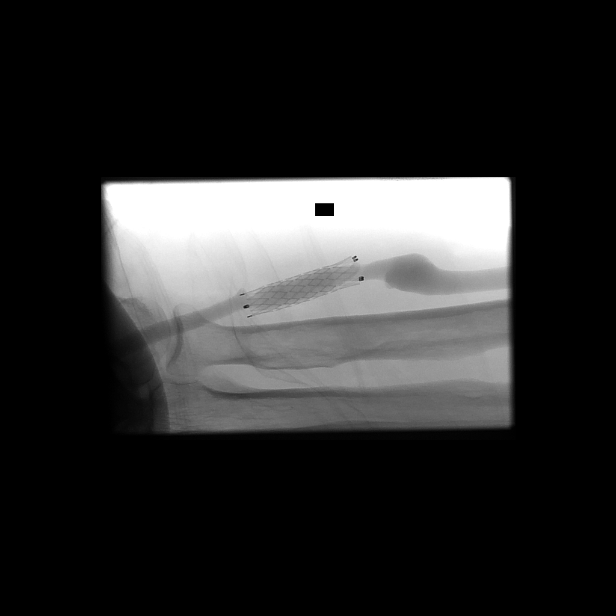
[im 3/30]
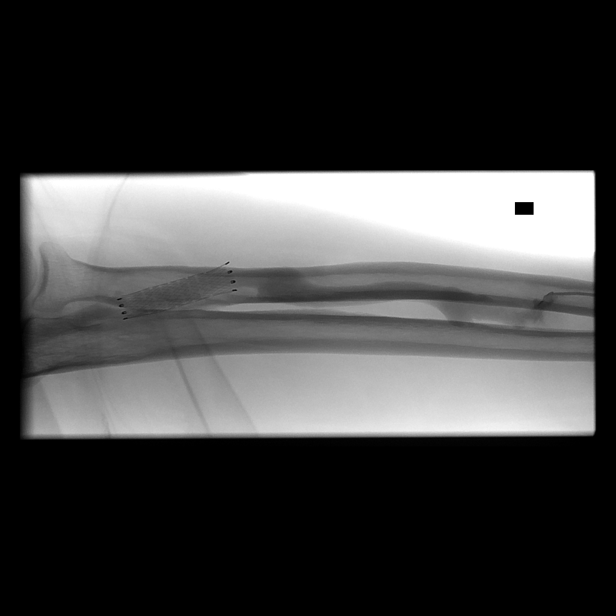
[im 6/30]
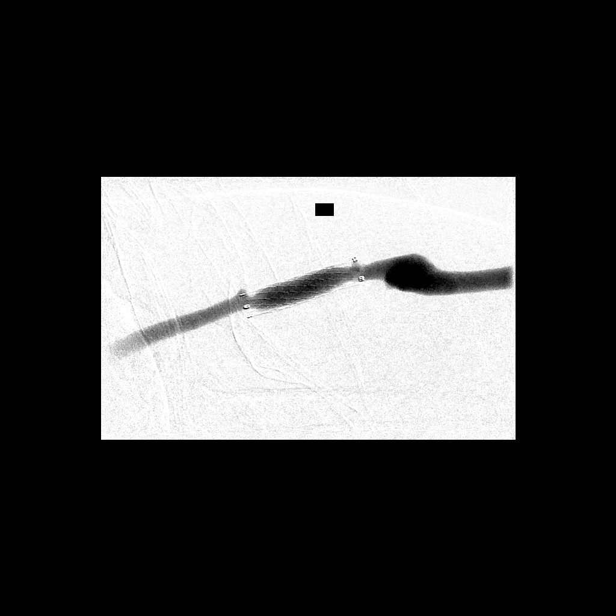
[im 8/30]
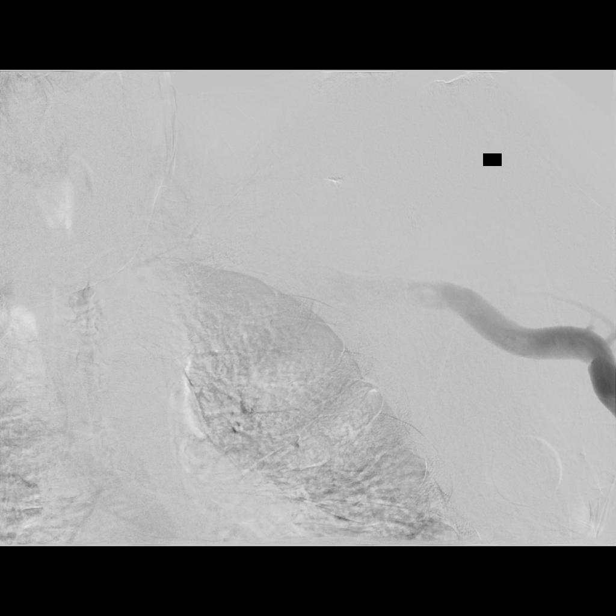
[im 9/30]
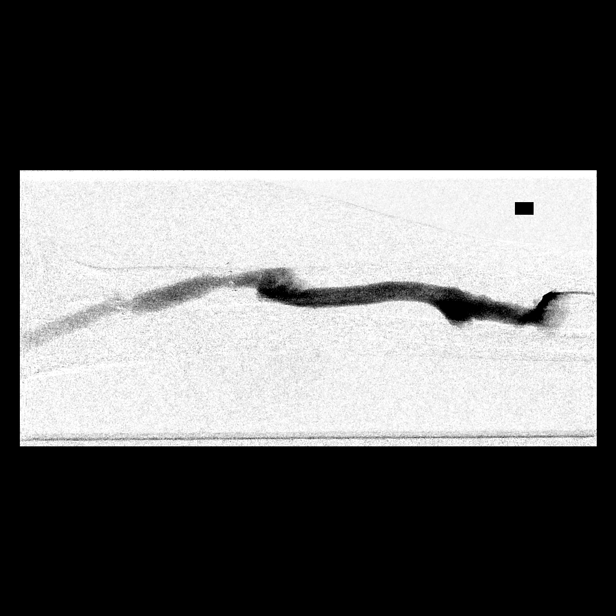
[im 12/30]
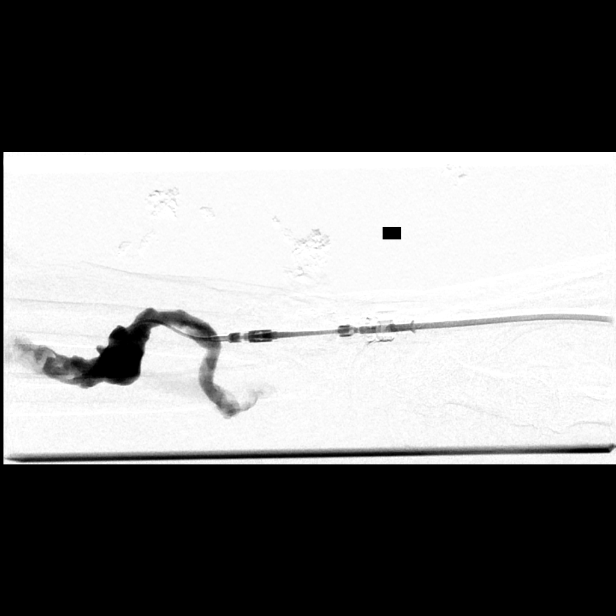
[im 14/30]
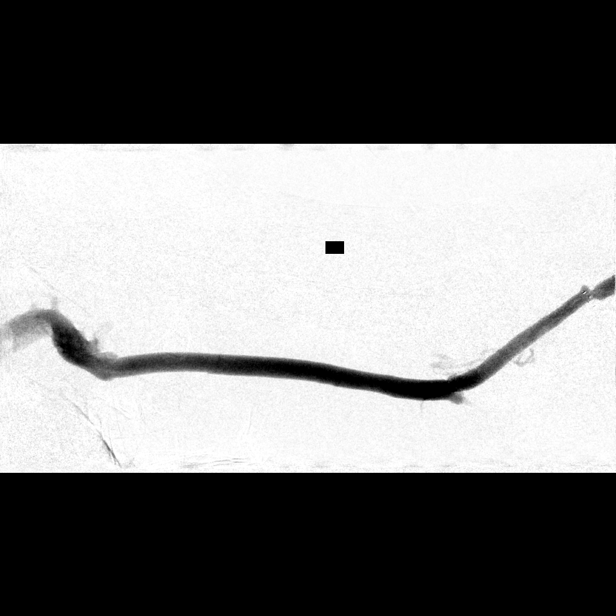
[im 16/30]
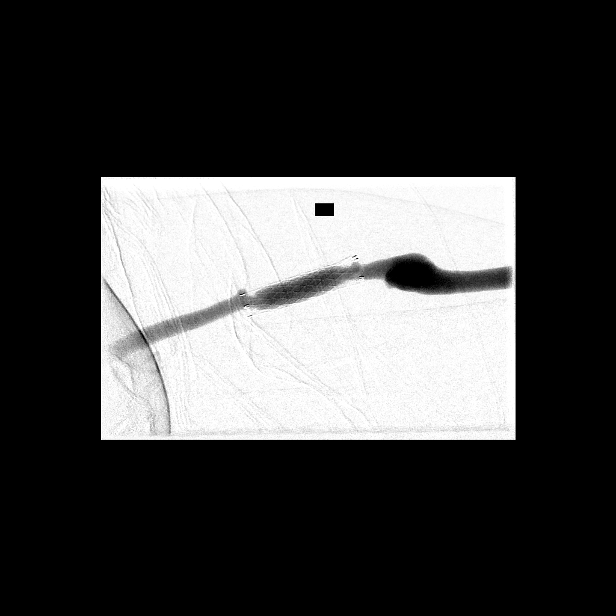
[im 18/30]
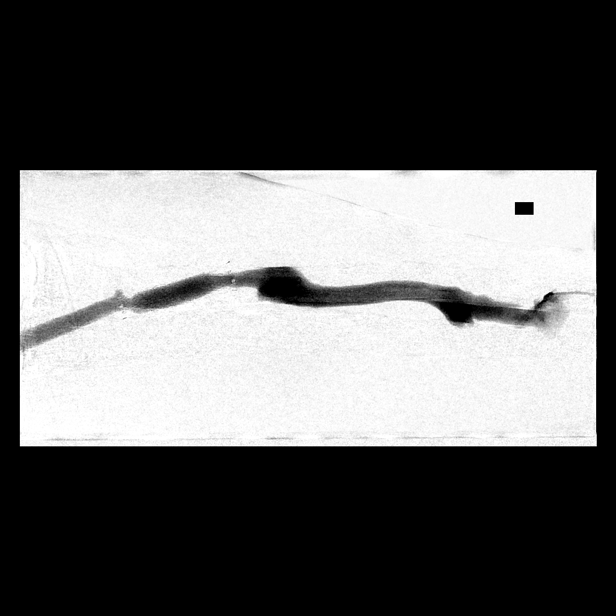
[im 21/30]
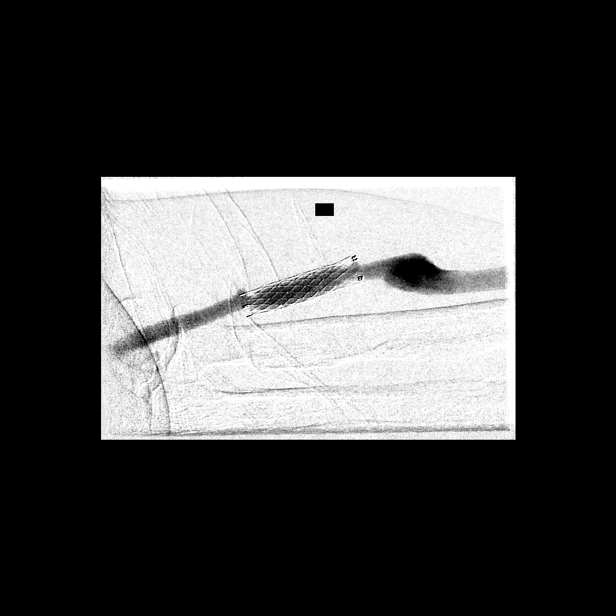
[im 23/30]
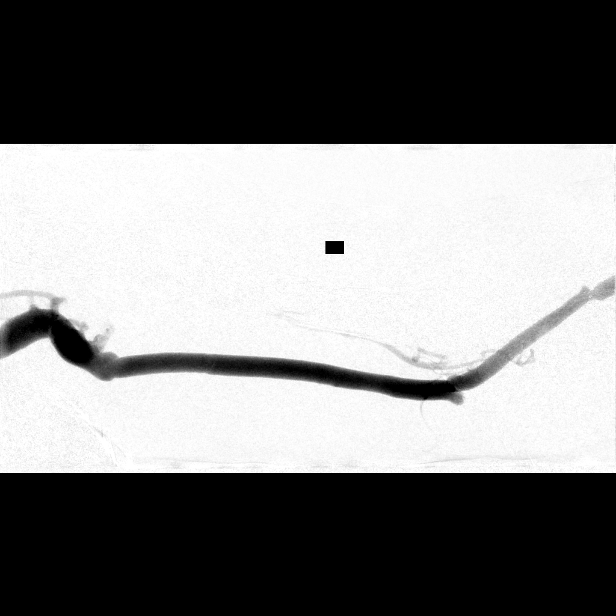
[im 24/30]
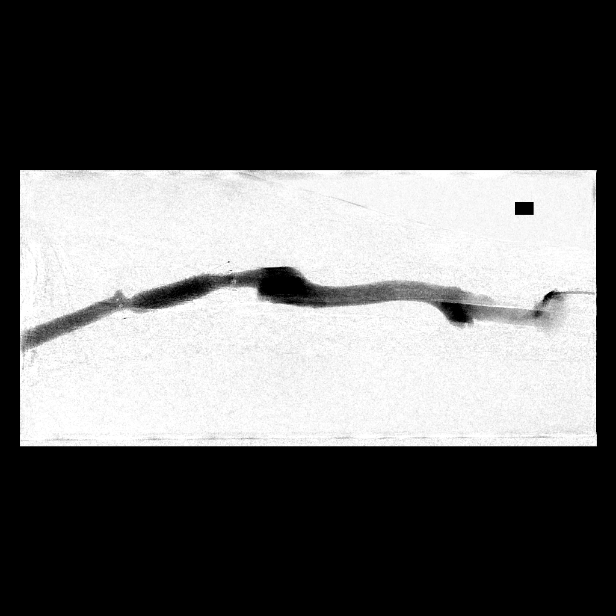
[im 27/30]
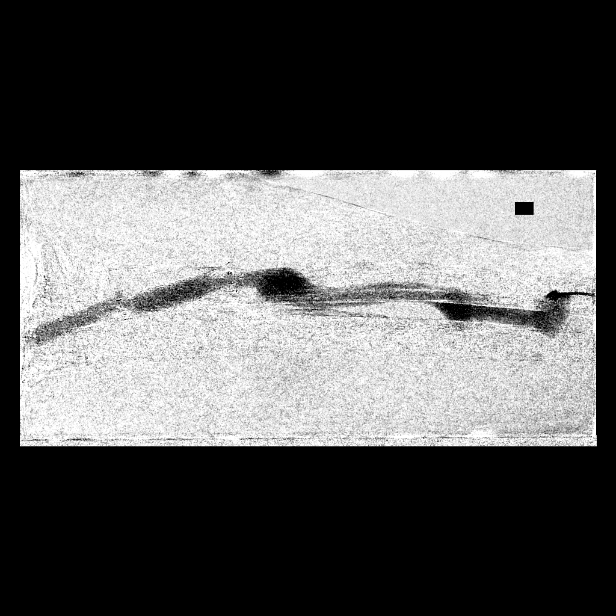
[im 30/30]
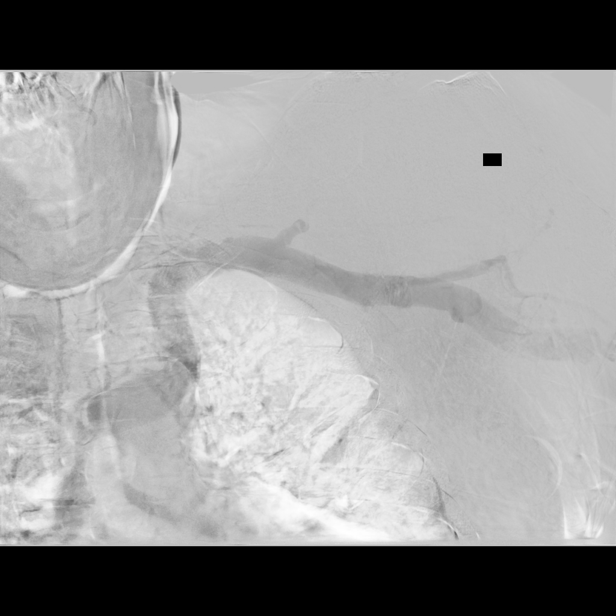

[14 of 24 positions shown; findings below may reference images not displayed]

CONTRAST:  30 mL [id number]

FLUOROSCOPY TIME:  42 seconds.  15.3 mGy.

PROCEDURE:
The procedure, risks, benefits, and alternatives were explained to
the patient. Questions regarding the procedure were encouraged and
answered. The patient understands and consents to the procedure.

The left arm native fistula was prepped with chlorhexidine in a
sterile fashion, and a sterile drape was applied covering the
operative field. A diagnostic fistulogram was performed via an 18
gauge angiocatheter introduced into venous outflow. Venous drainage
was assessed to the level of the central veins in the chest.
Proximal fistula was studied by reflux maneuver with temporary
compression of venous outflow. After the procedure, the
angiocatheter was removed and hemostasis obtained with manual
compression.

COMPLICATIONS:
None
FINDINGS: Arterial anastomosis between the left radial artery and cephalic
vein is normally patent. Initial segment of the fistula is tortuous
but widely patent and well matured. An indwelling venous stent is
present in venous outflow located in the proximal left forearm.
There is some relative narrowing within the proximal and distal
aspects of the stent. However, excellent flow was present through
the stented segment and stenoses do not appear significant enough to
currently warrant angioplasty. The stent itself is larger than the
vein proximal to the stent and especially distal to the stent. When
matched to the native vein proximal and distal to the stent,
stenoses within the stent approach only approximately 30-40%
narrowing. Palpation at the level of the stent and venous outflow
also demonstrates an excellent thrill clinically.

There appears to be a prior surgical basilic transposition at the
level of the antecubital fossa. Transposition and draining deep vein
of the left upper arm is widely patent and well matured. Central
veins show normal patency including the subclavian vein, innominate
vein and SVC.
IMPRESSION: Widely patent left radiocephalic AV fistula with prior basilic
transposition. There is some mild relative narrowing within proximal
and distal aspects of a previously placed cephalic venous outflow
stent in the proximal forearm. Stenoses were only estimated to be
approximately 30-40% when matched to the native vein. There also is
an excellent palpable thrill throughout this segment and the rest of
the fistula in the forearm.

ACCESS:
No intervention on the fistula was necessary or performed today.

## 2019-10-24 IMAGING — DX DG CHEST 1V PORT
1 series · 1 of 1 positions shown · non-contrast
Comparison: 04/17/2018

CLINICAL DATA: Shortness of breath and left shoulder pain for 3
weeks. Symptoms worsening.

EXAM:
PORTABLE CHEST 1 VIEW

[chest]
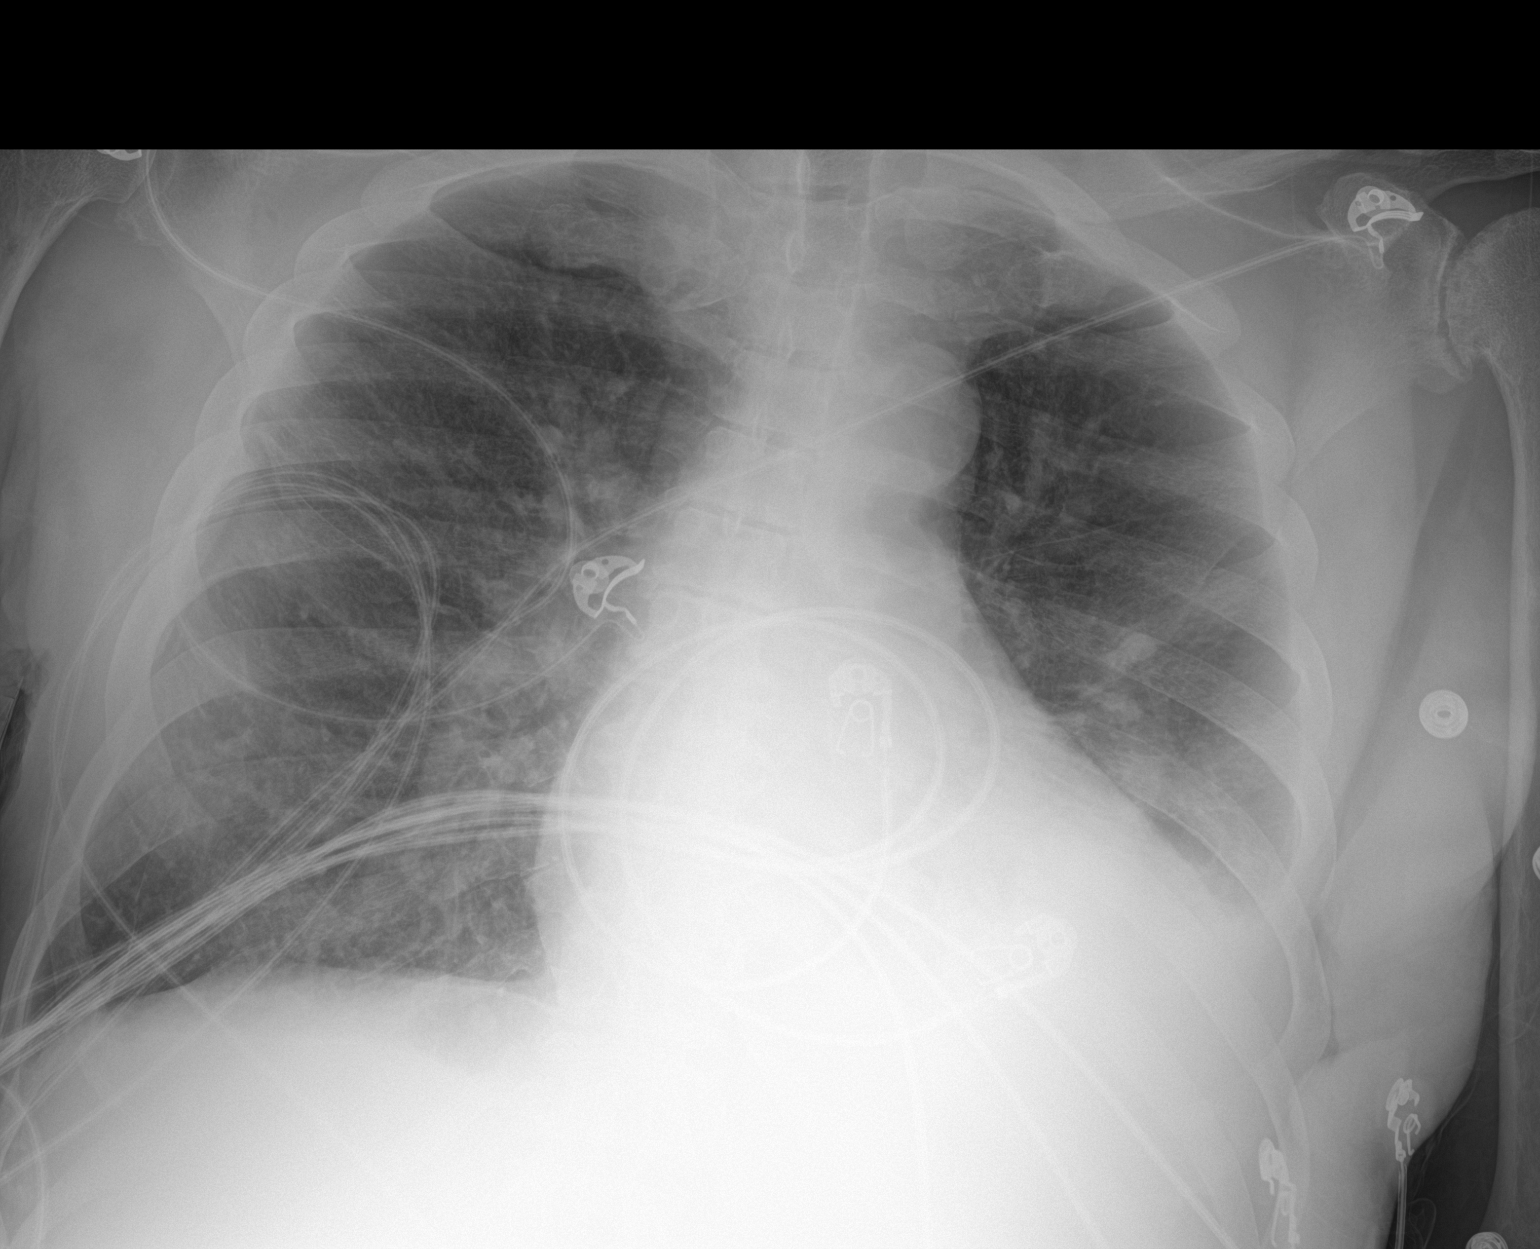

[1 of 1 positions shown; findings below may reference images not displayed]

FINDINGS: Borderline heart size and pulmonary vascularity probably normal for
technique and shallow inspiration. There is increased density
consistent with consolidation in the left lung base with blunting of
the left costophrenic angle suggesting a small effusion. These
changes likely indicate left lower lobe pneumonia. Right lung is
clear. Mediastinal contours appear intact. No pneumothorax.
Degenerative changes in the shoulders.
IMPRESSION: Infiltration and effusion in the left lung base likely representing
pneumonia.

## 2019-10-25 IMAGING — DX DG CHEST 2V
2 series · 2 of 2 positions shown · non-contrast
Comparison: 05/01/2018

CLINICAL DATA: Shortness of breath

EXAM:
CHEST - 2 VIEW

[chest lat]
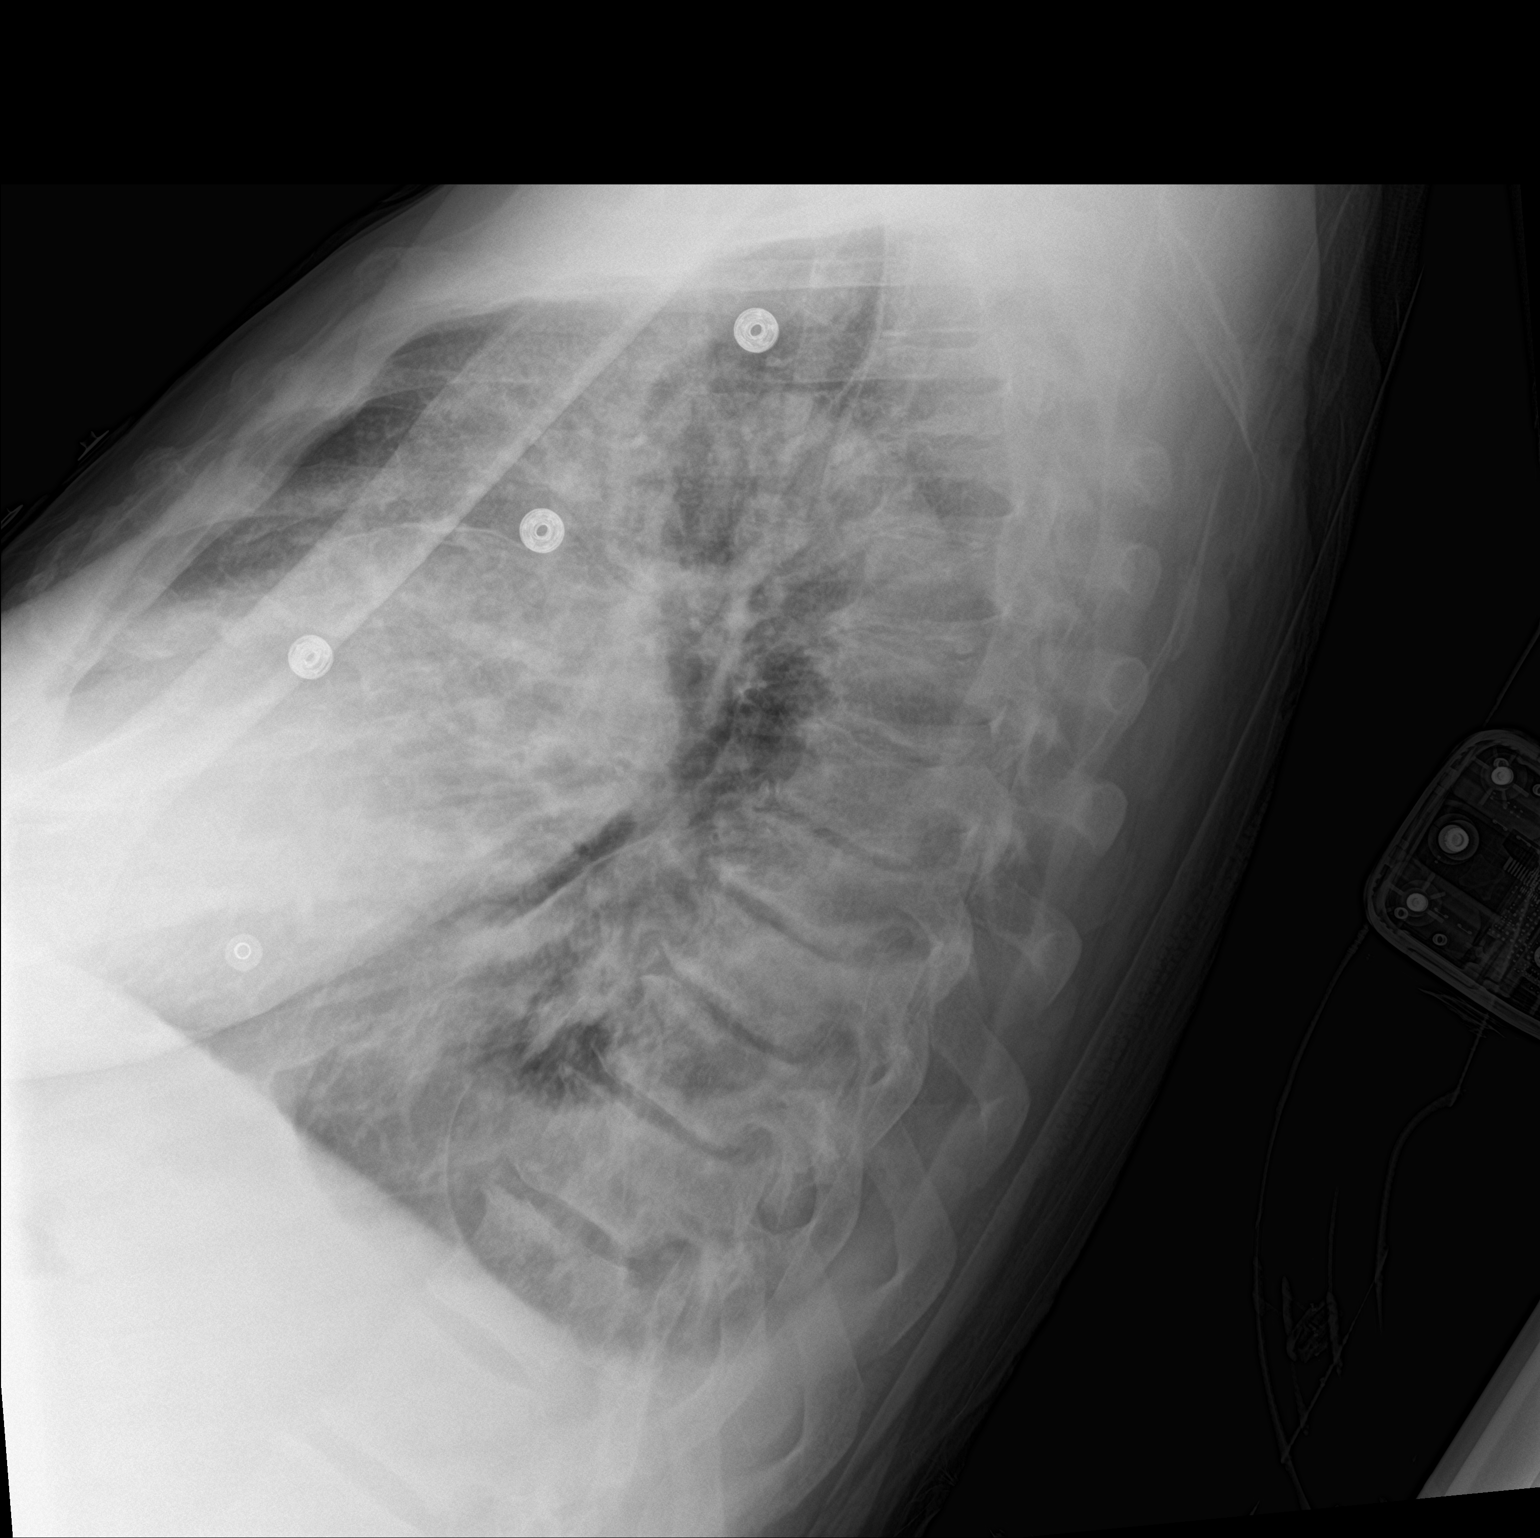

[chest ap]
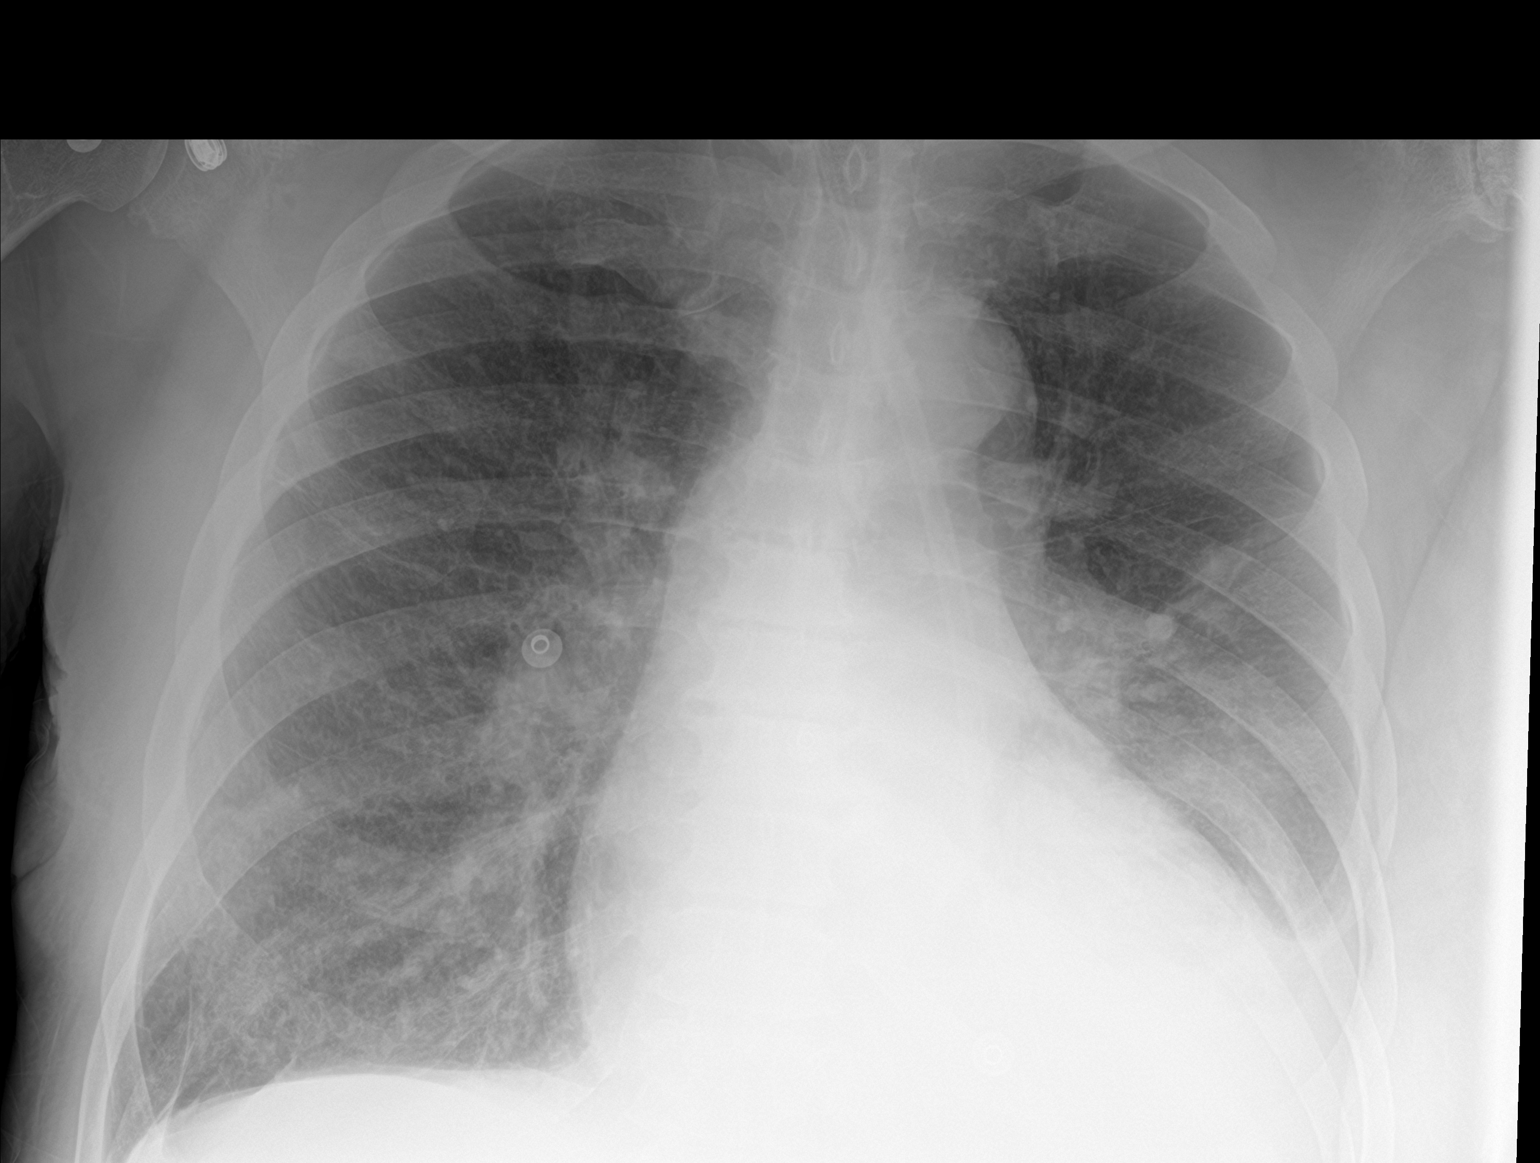

[2 of 2 positions shown; findings below may reference images not displayed]

FINDINGS: Cardiac shadow is stable. Persistent infiltrate and effusion in the
left base is seen stable from the recent exam. The right lung is
clear. No bony abnormality is seen.
IMPRESSION: Left basilar infiltrate with associated effusion.

## 2019-10-25 IMAGING — CR DG SHOULDER 2+V*L*
2 series · 2 of 2 positions shown · non-contrast
Comparison: None.

CLINICAL DATA: Recent fall with left shoulder pain, initial
encounter

EXAM:
LEFT SHOULDER - 2+ VIEW

[shoulder grashey]
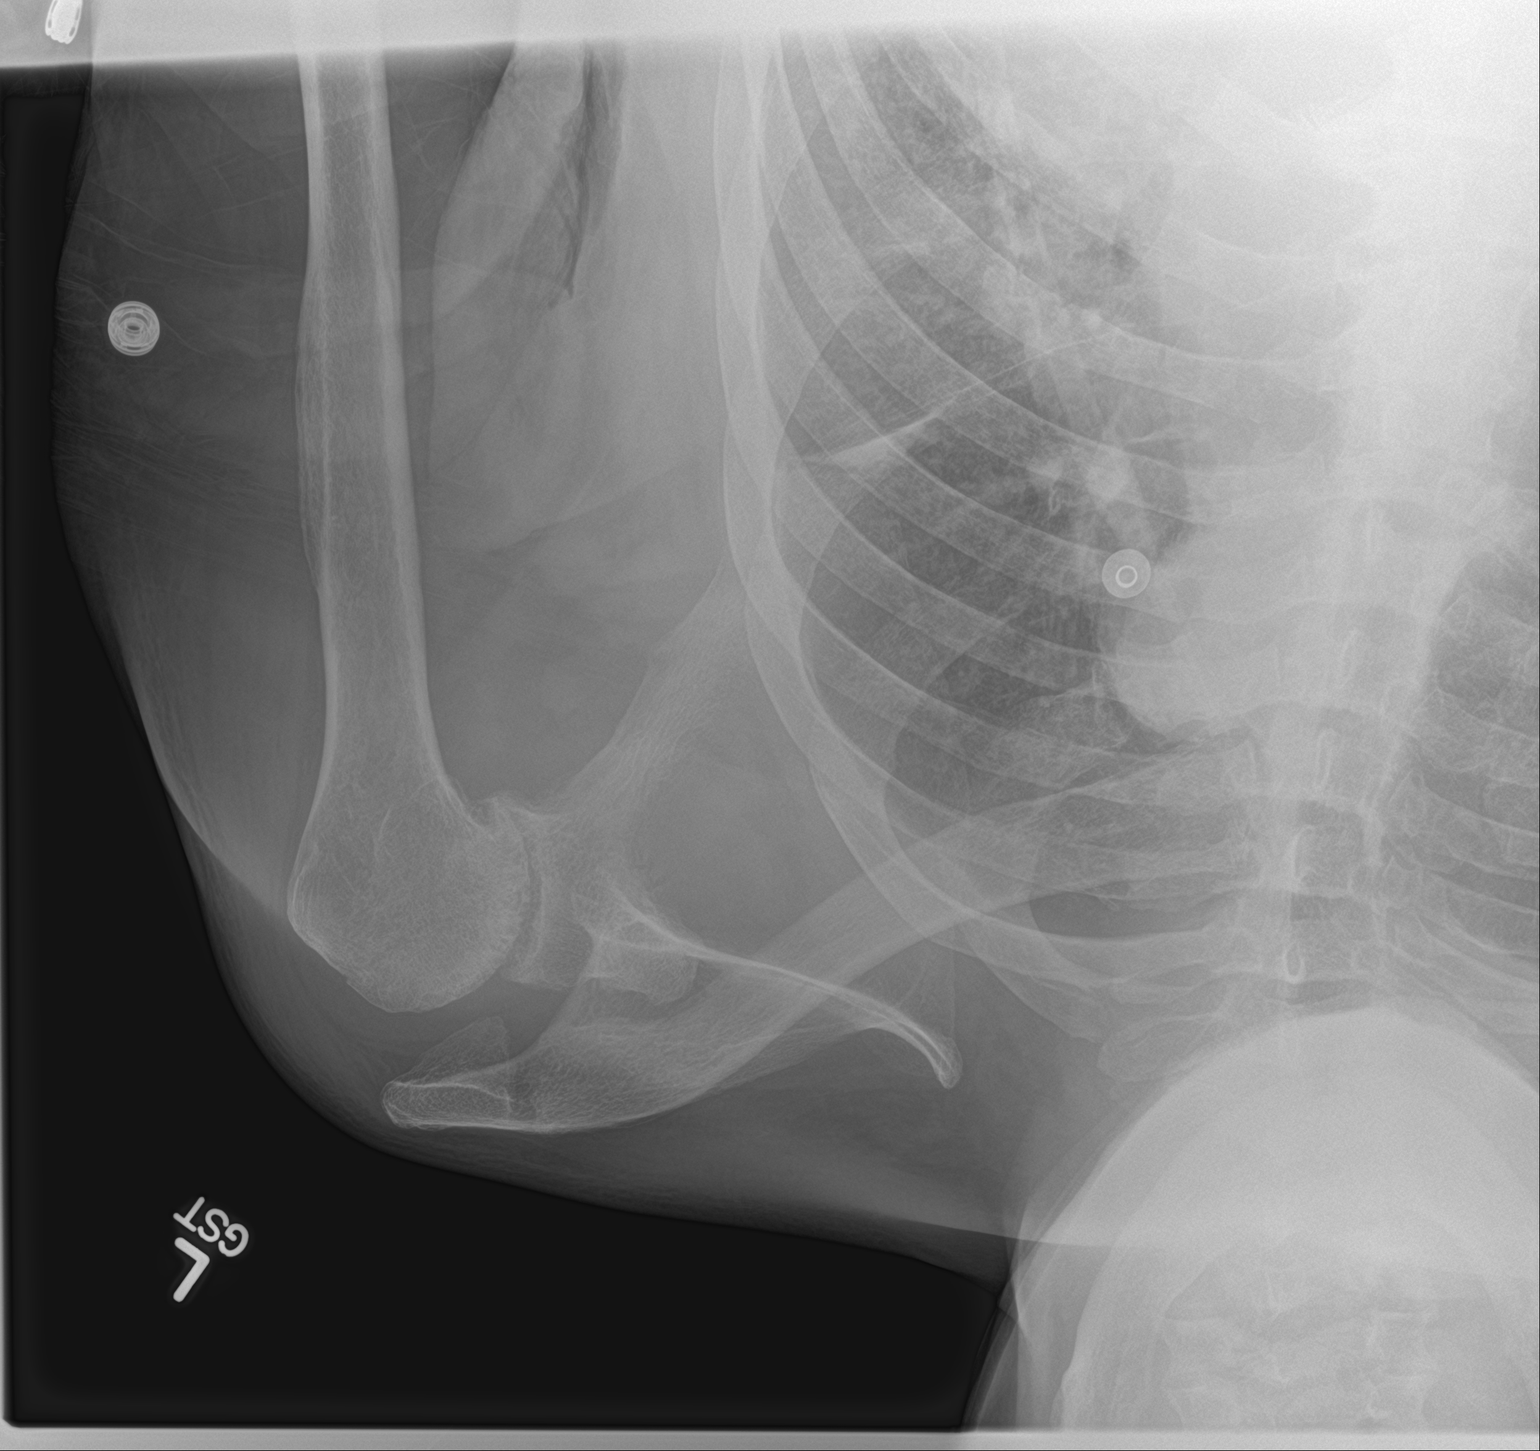

[shoulder y view]
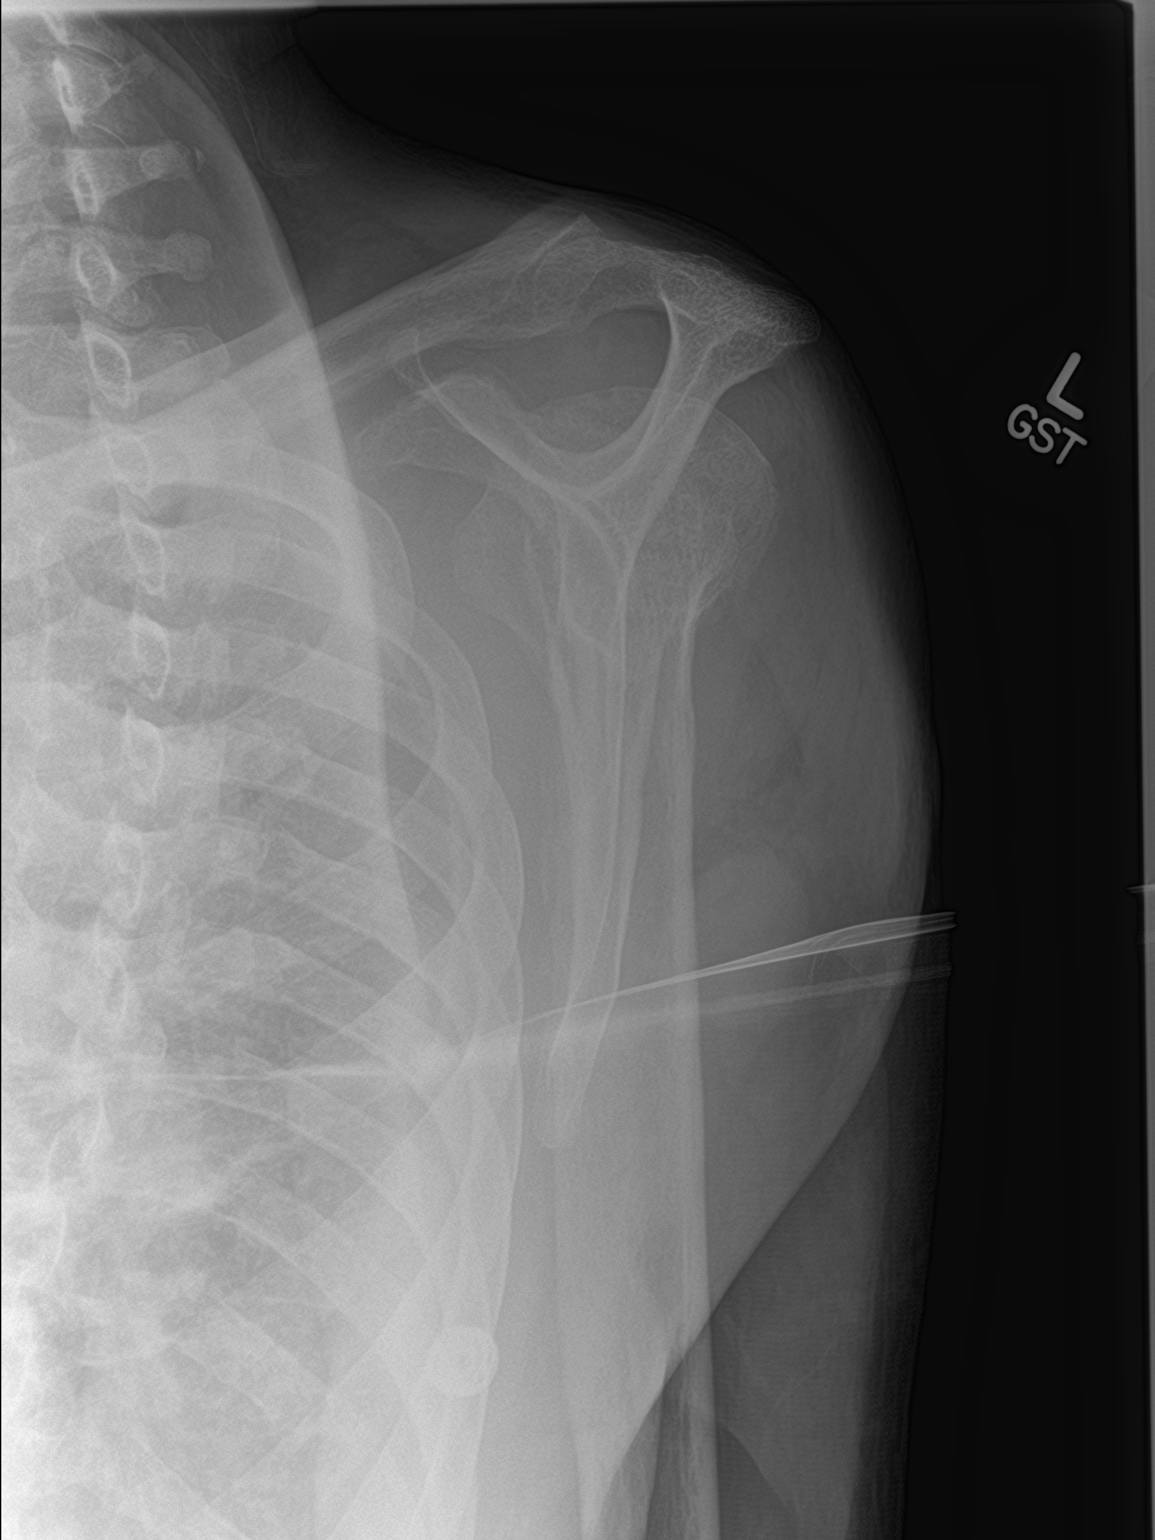

[2 of 2 positions shown; findings below may reference images not displayed]

FINDINGS: Degenerative changes of the glenohumeral joint are seen. No acute
fracture or dislocation is noted. Underlying bony thorax is within
normal limits.
IMPRESSION: Degenerative changes without acute abnormality.

## 2019-11-08 IMAGING — MR MR SHOULDER*L* W/O CM
4 of 5 series · 19 of 40 positions shown · non-contrast
Comparison: None.

CLINICAL DATA: Severe left shoulder pain for 1 month since a fall.

EXAM:
MRI OF THE LEFT SHOULDER WITHOUT CONTRAST
TECHNIQUE: Multiplanar, multisequence MR imaging of the shoulder was performed.
No intravenous contrast was administered.

[Series 6: PD fat-sat · axial · left · 4.0mm · 0.44mm/px · z∈[-90,-0]mm · 6 of 20 slices shown (1 of 2)]
[im 1/20]
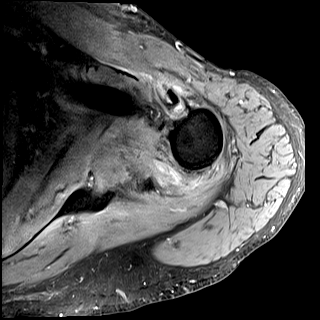
[im 4/20]
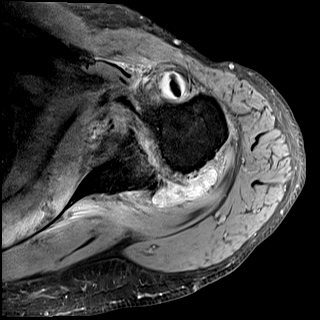
[im 8/20]
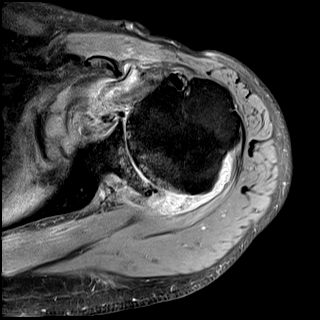
[im 12/20]
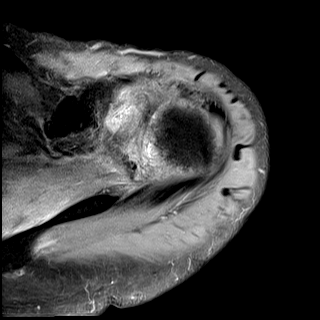
[im 16/20]
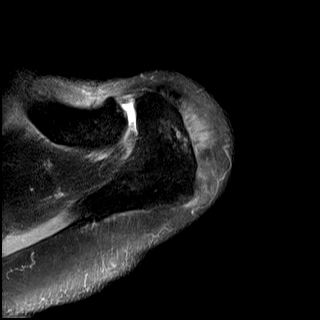
[im 20/20]
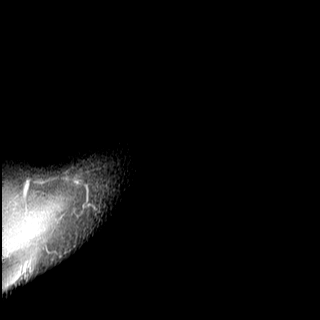

[Series 7: T2 fat-sat · sagittal · left · 4.0mm · 0.44mm/px · 3 of 23 slices shown (1 of 2)]
[im 3/23]
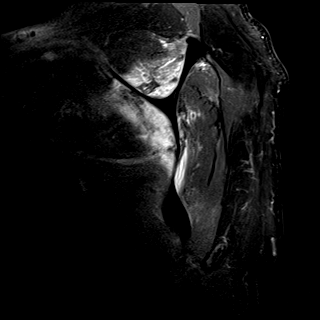
[im 12/23]
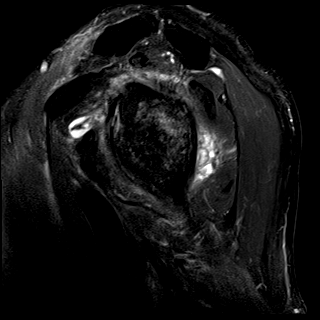
[im 20/23]
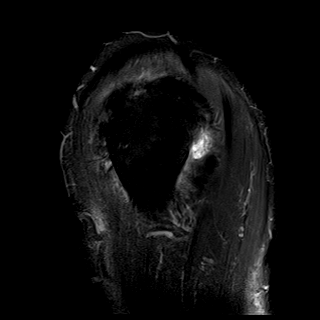

[Series 9: T2 fat-sat · oblique · left · 4.0mm · 0.22mm/px · 3 of 21 slices shown (2 of 2)]
[im 3/21]
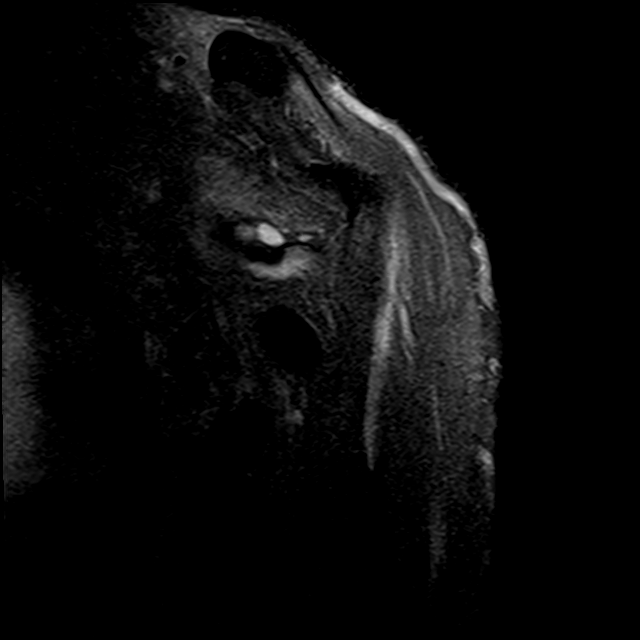
[im 12/21]
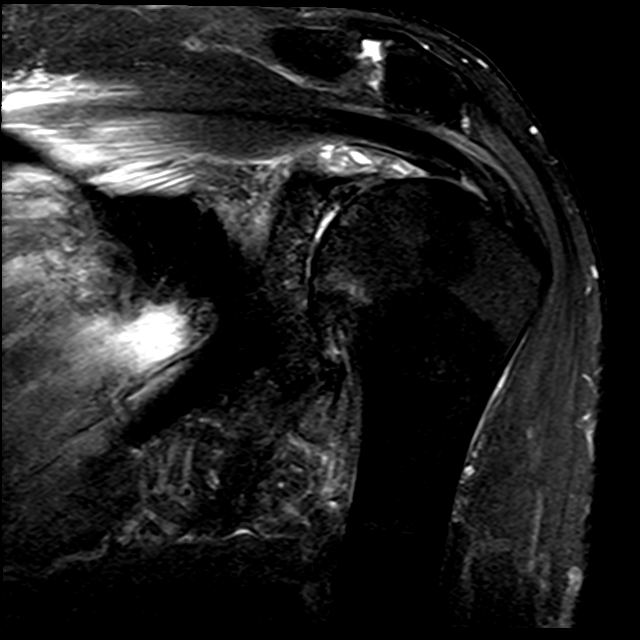
[im 18/21]
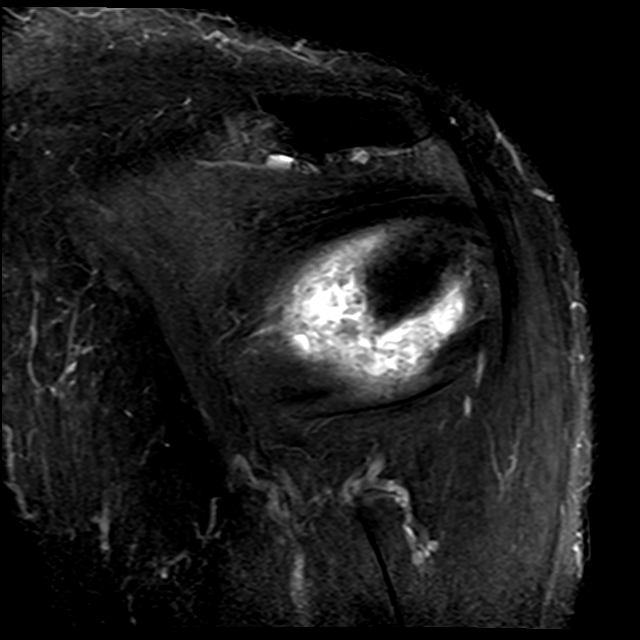

[Series 10: PD fat-sat · oblique · left · 4.0mm · 0.22mm/px · 7 of 21 slices shown (2 of 2)]
[im 1/21]
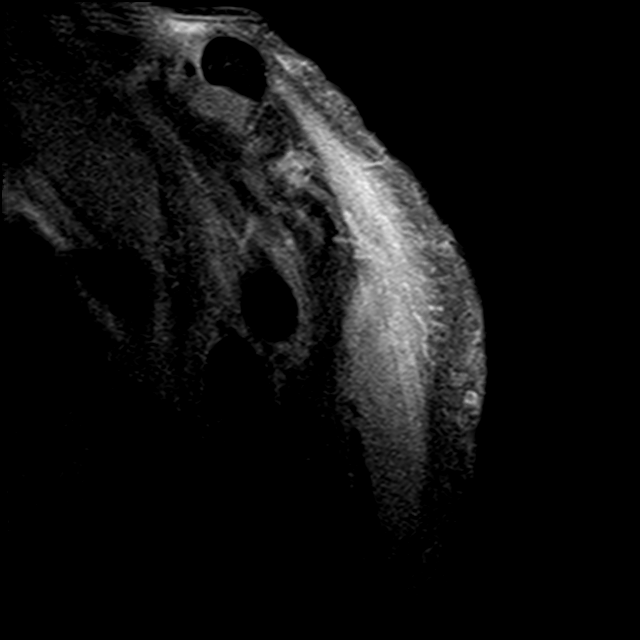
[im 3/21]
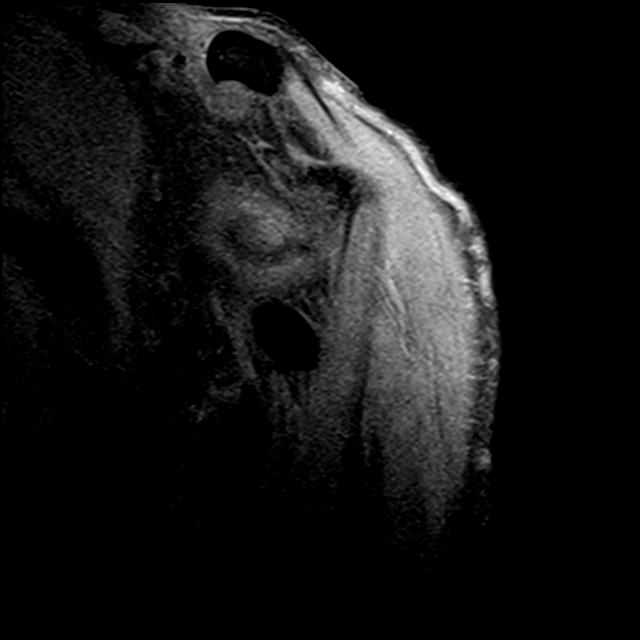
[im 6/21]
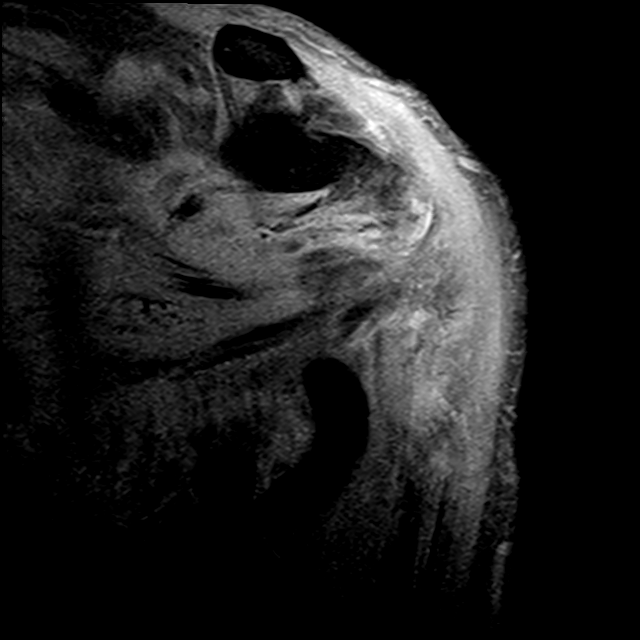
[im 9/21]
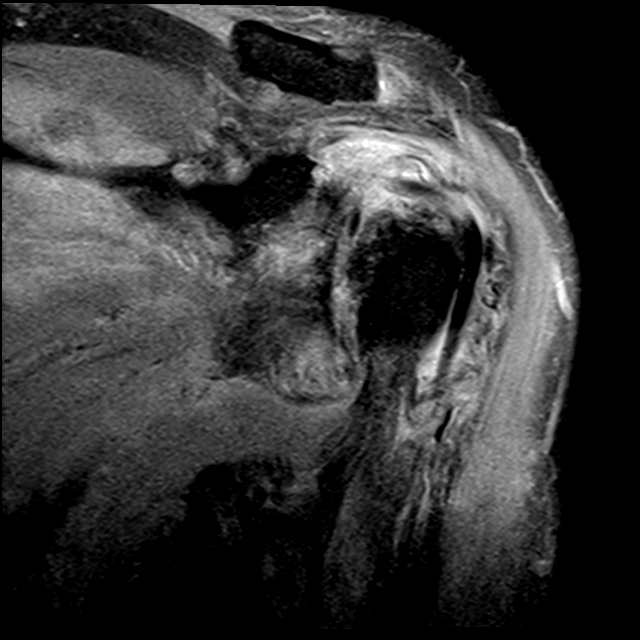
[im 12/21]
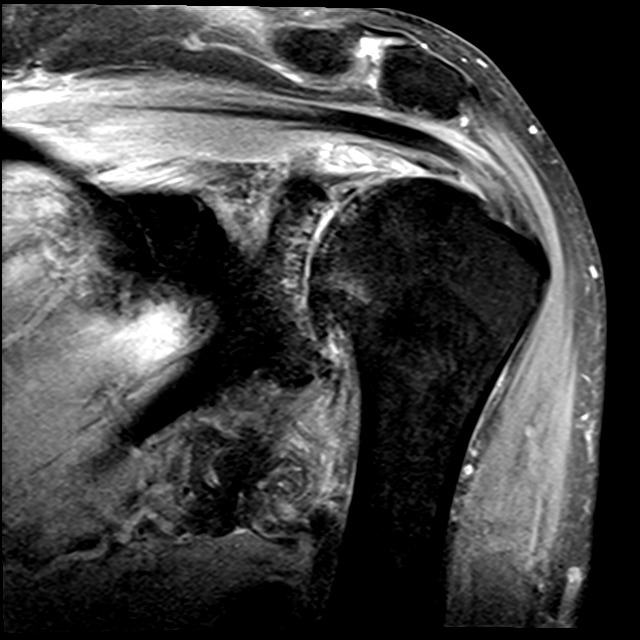
[im 15/21]
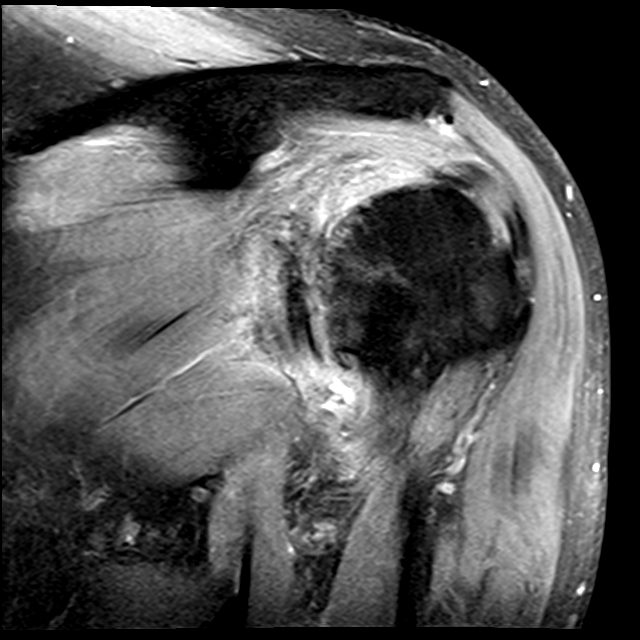
[im 18/21]
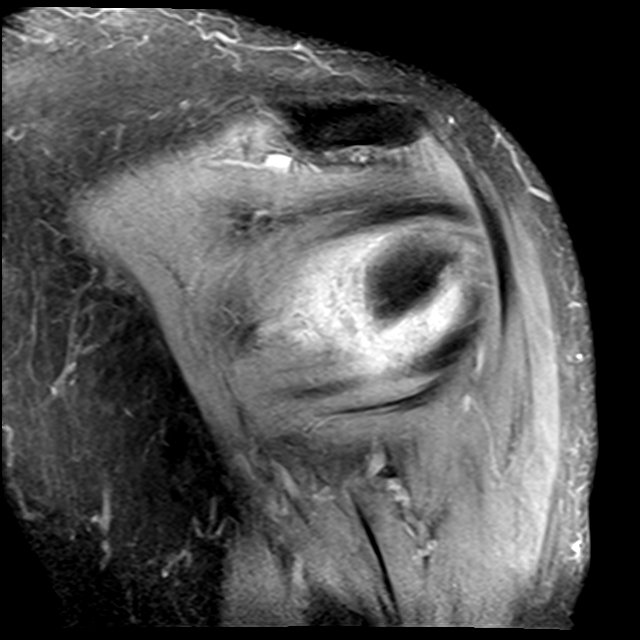

[19 of 40 positions shown; findings below may reference images not displayed]

FINDINGS: Rotator cuff: Thickening and heterogeneously increased T2 signal in
the rotator cuff tendons consistent with tendinopathy. No tear is
identified.

Muscles: Edema in the rotator cuff musculature appears worst in the
supraspinatus and infraspinatus. No atrophy.

Biceps long head: Intact with tendinopathy of the intra-articular
long head of biceps identified.

Acromioclavicular Joint: Moderate to moderately severe
acromioclavicular osteoarthritis is present. Type 3 acromion. No
subacromial/subdeltoid bursal fluid.

Glenohumeral Joint: There is bone-on-bone glenohumeral joint space
narrowing with innumerable small subchondral cysts and subchondral
erosions. The glenoid is flattened and remodeled. Subchondral edema
is present about the joint and there is an osteophyte off the
humeral head. Glenohumeral joint effusion contains debris.

Labrum:  The labrum is diffusely and severely degenerated and torn.

Bones:  No fracture or worrisome lesion.

Other: None.
IMPRESSION: Dominant finding is severe glenohumeral osteoarthritis. Small to
moderate glenohumeral joint effusion contains a large volume of
debris. Tiny subchondral erosions in the joint and debris in the
effusion raise the possibility inflammatory arthropathy such as
rheumatoid or metabolic bone disease.

Edema in the rotator cuff musculature is worst in the supraspinatus
and subscapularis and could be due to strain, myositis or early
atrophy.

Moderate acromioclavicular osteoarthritis that moderate to
moderately severe acromioclavicular osteoarthritis. Type 3 acromion
also noted.

## 2019-11-19 IMAGING — CR DG ABDOMEN 2V
2 series · 2 of 2 positions shown · non-contrast
Comparison: CT 04/01/2018

CLINICAL DATA: Constipation and abdominal pain for several weeks.

EXAM:
ABDOMEN - 2 VIEW

[abdomen erect]
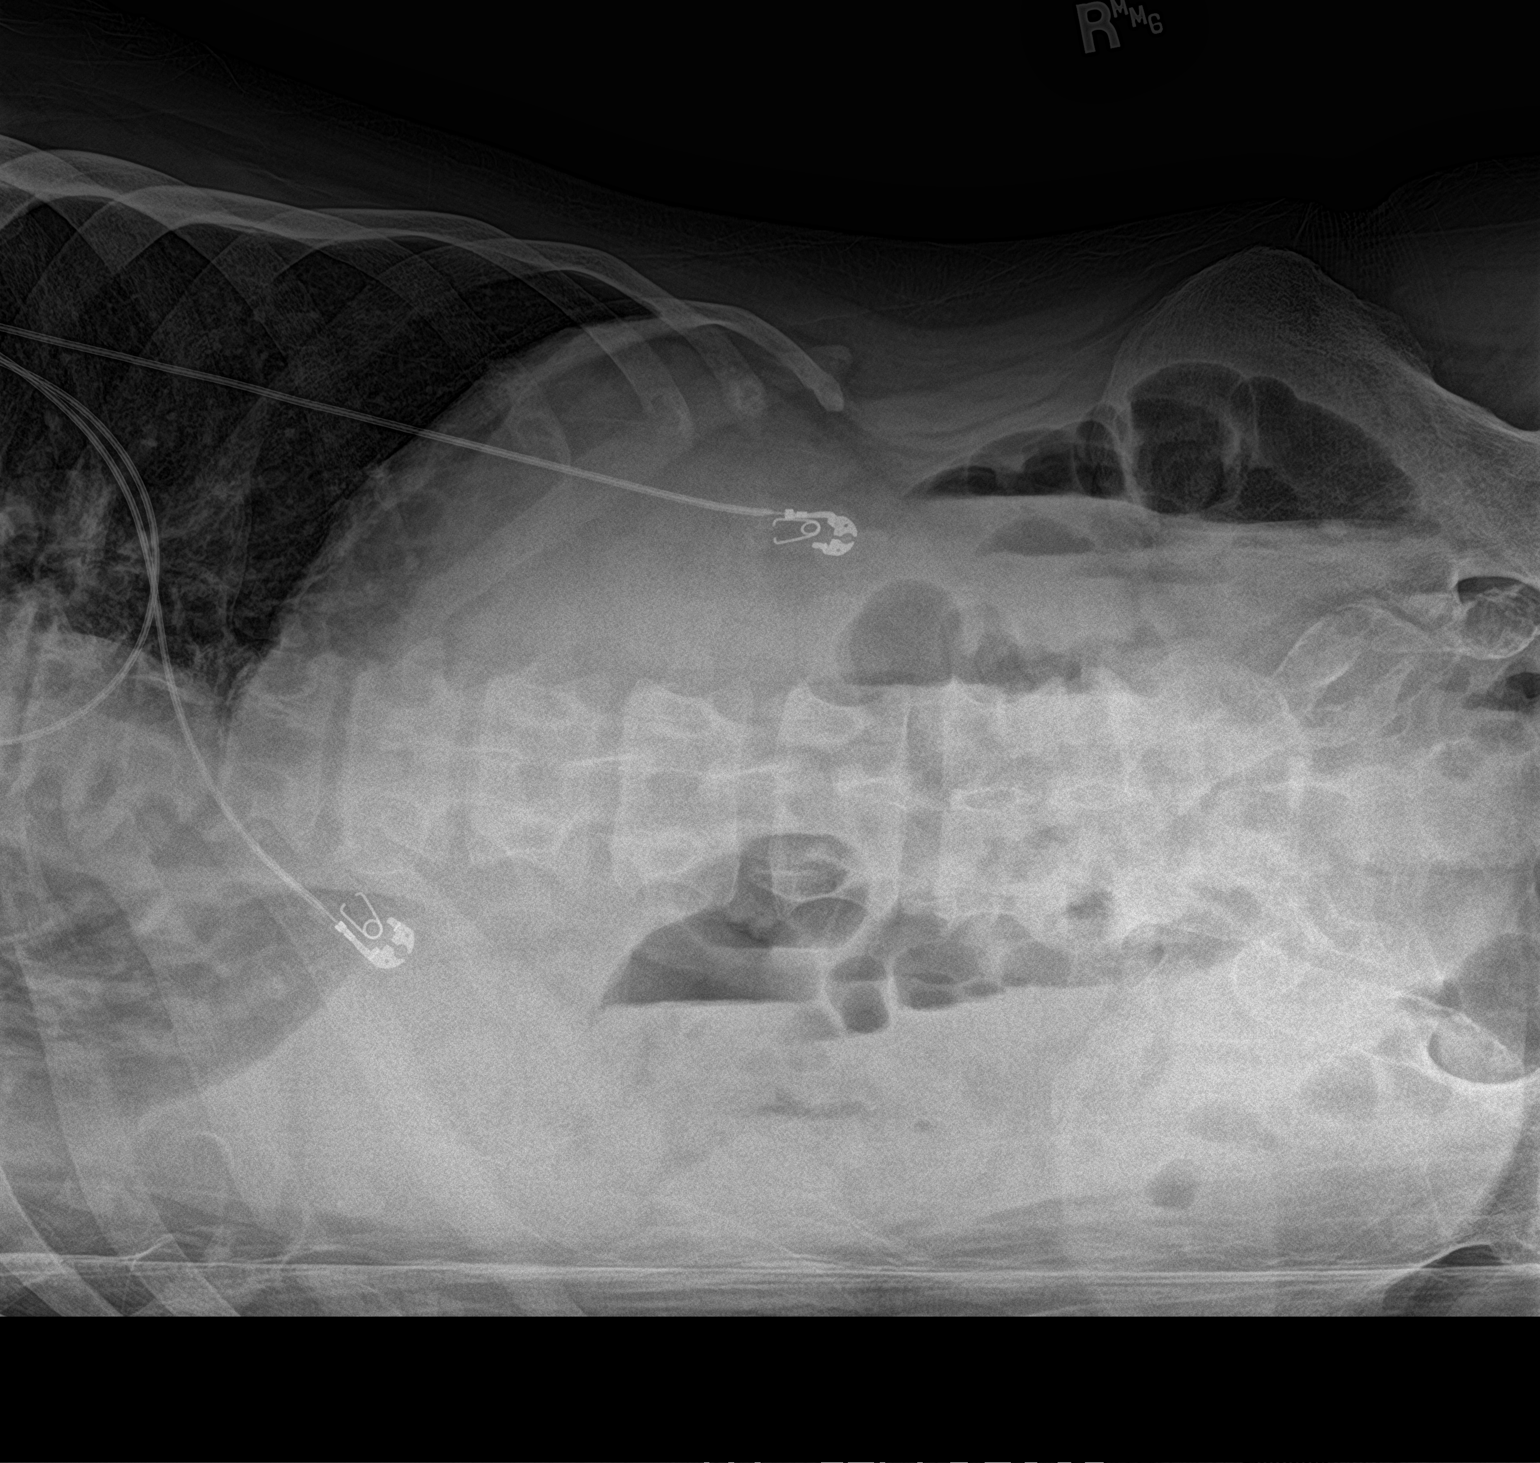

[abdomen supine]
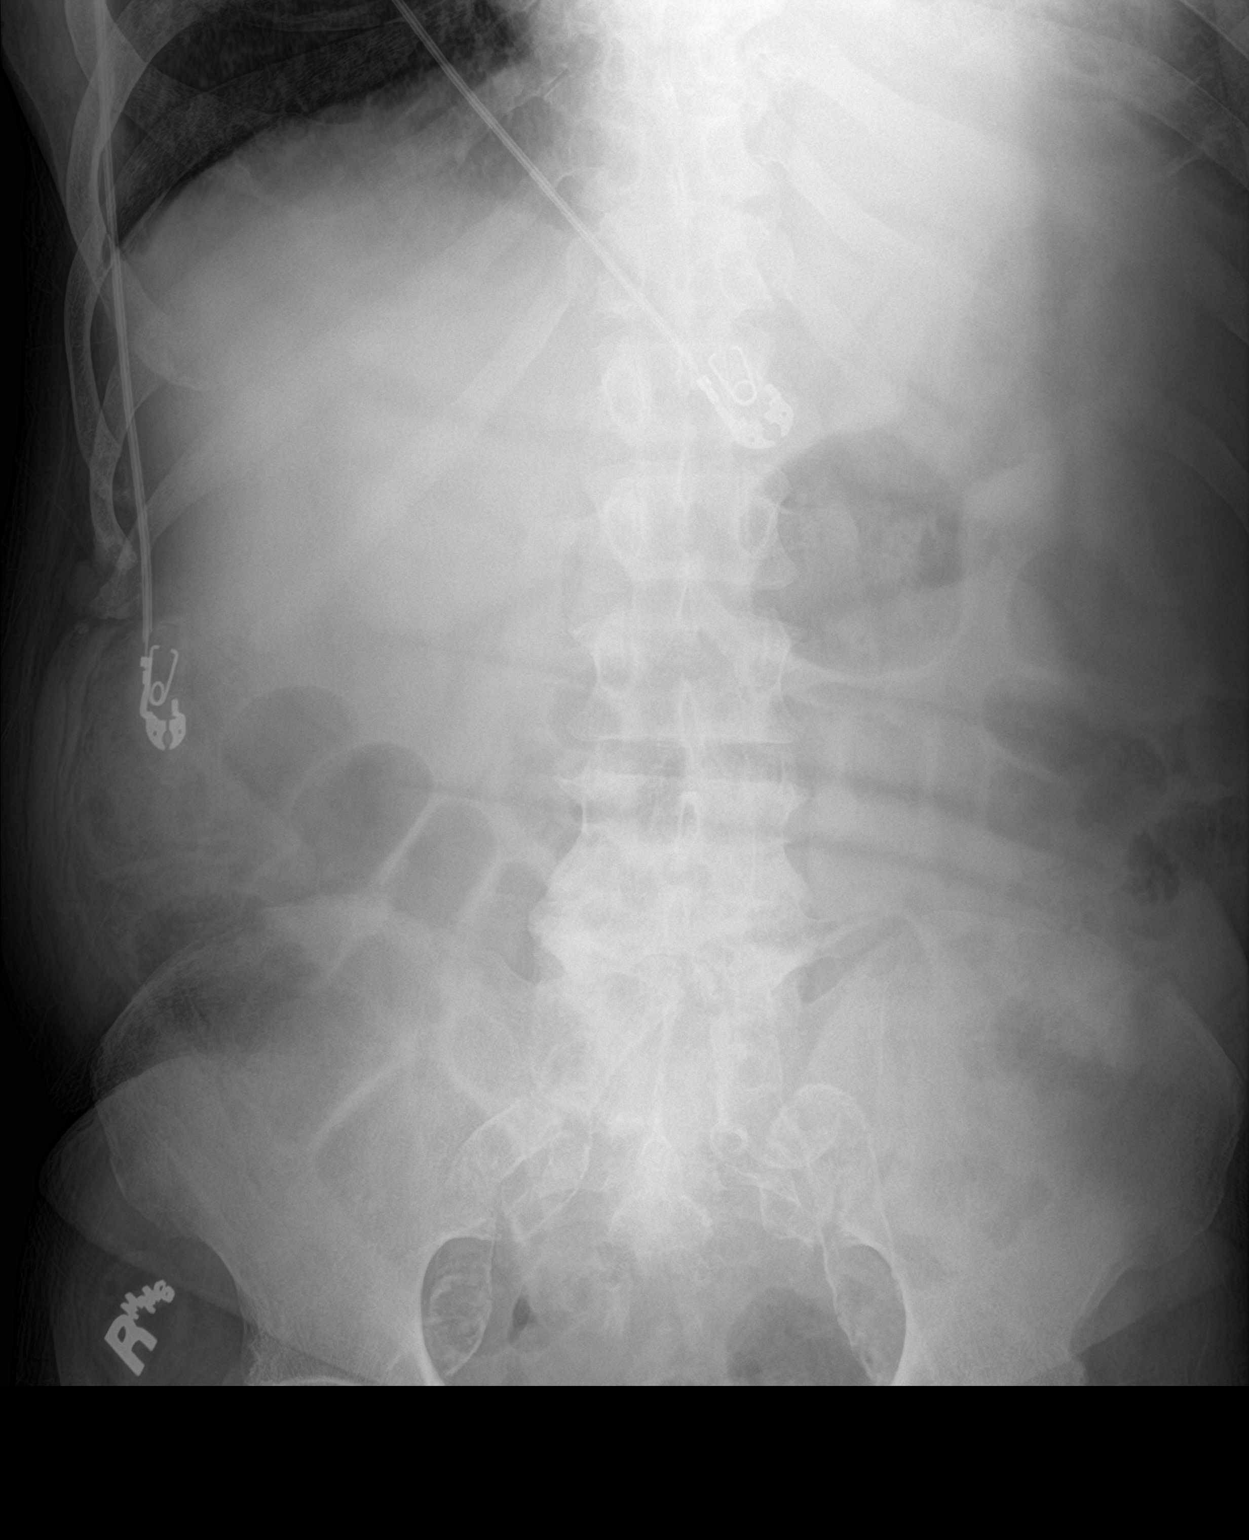

[2 of 2 positions shown; findings below may reference images not displayed]

FINDINGS: Aortoiliac atherosclerosis with ectatic appearance of the common,
internal and external iliac arteries bilaterally. Surgical clip
projects over the left hemipelvis. Average amount of fecal retention
is noted at the hepatic flexure. No significant small bowel
dilatation or stool retention. No free air. Lower lumbar
degenerative disc and facet arthropathy is seen. Moderate left
pleural effusion.
IMPRESSION: 1. There is some retained stool in the hepatic flexure. Overall
however, no significant stool retention is seen.
2. Moderate layering left pleural effusion on decubitus views.

## 2019-11-19 IMAGING — CR DG CHEST 2V
2 series · 2 of 2 positions shown · non-contrast
Comparison: 05/02/2018, 05/01/2018, 04/17/2018

CLINICAL DATA: Shortness of breath

EXAM:
CHEST - 2 VIEW

[chest lat]
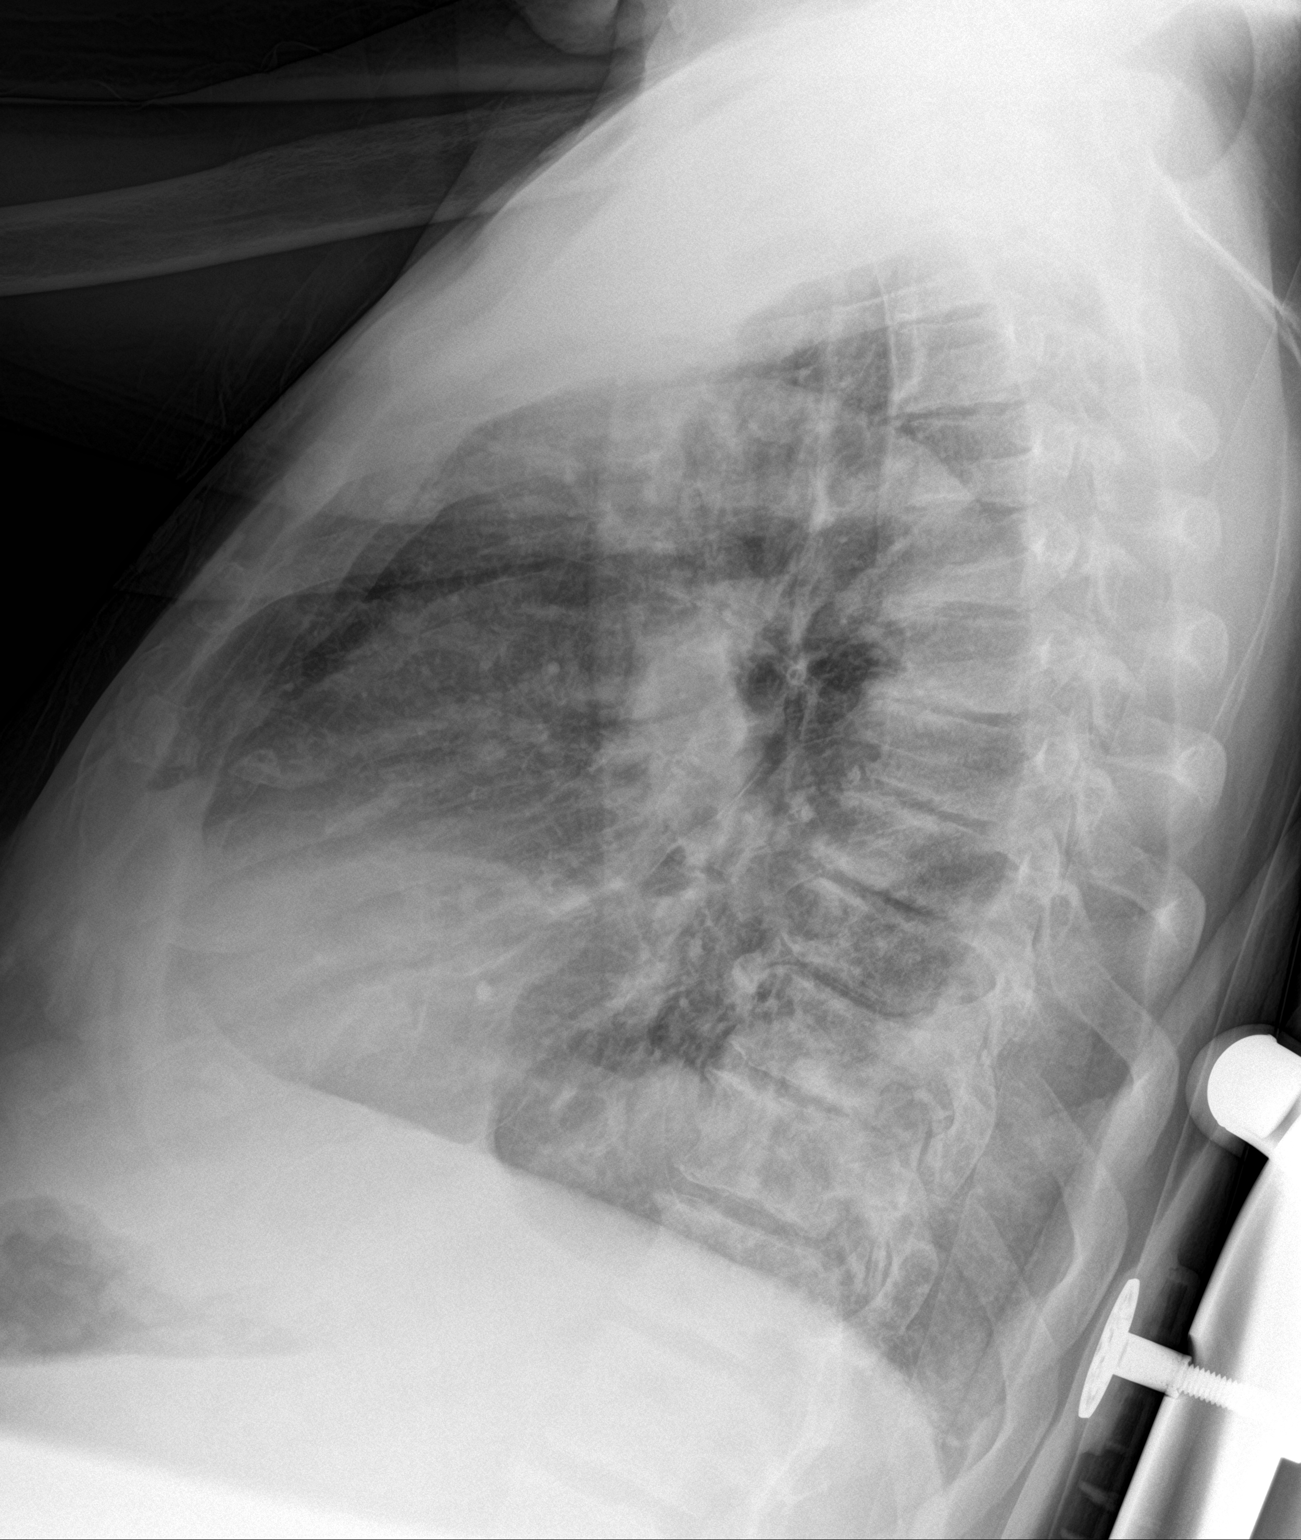

[chest ap]
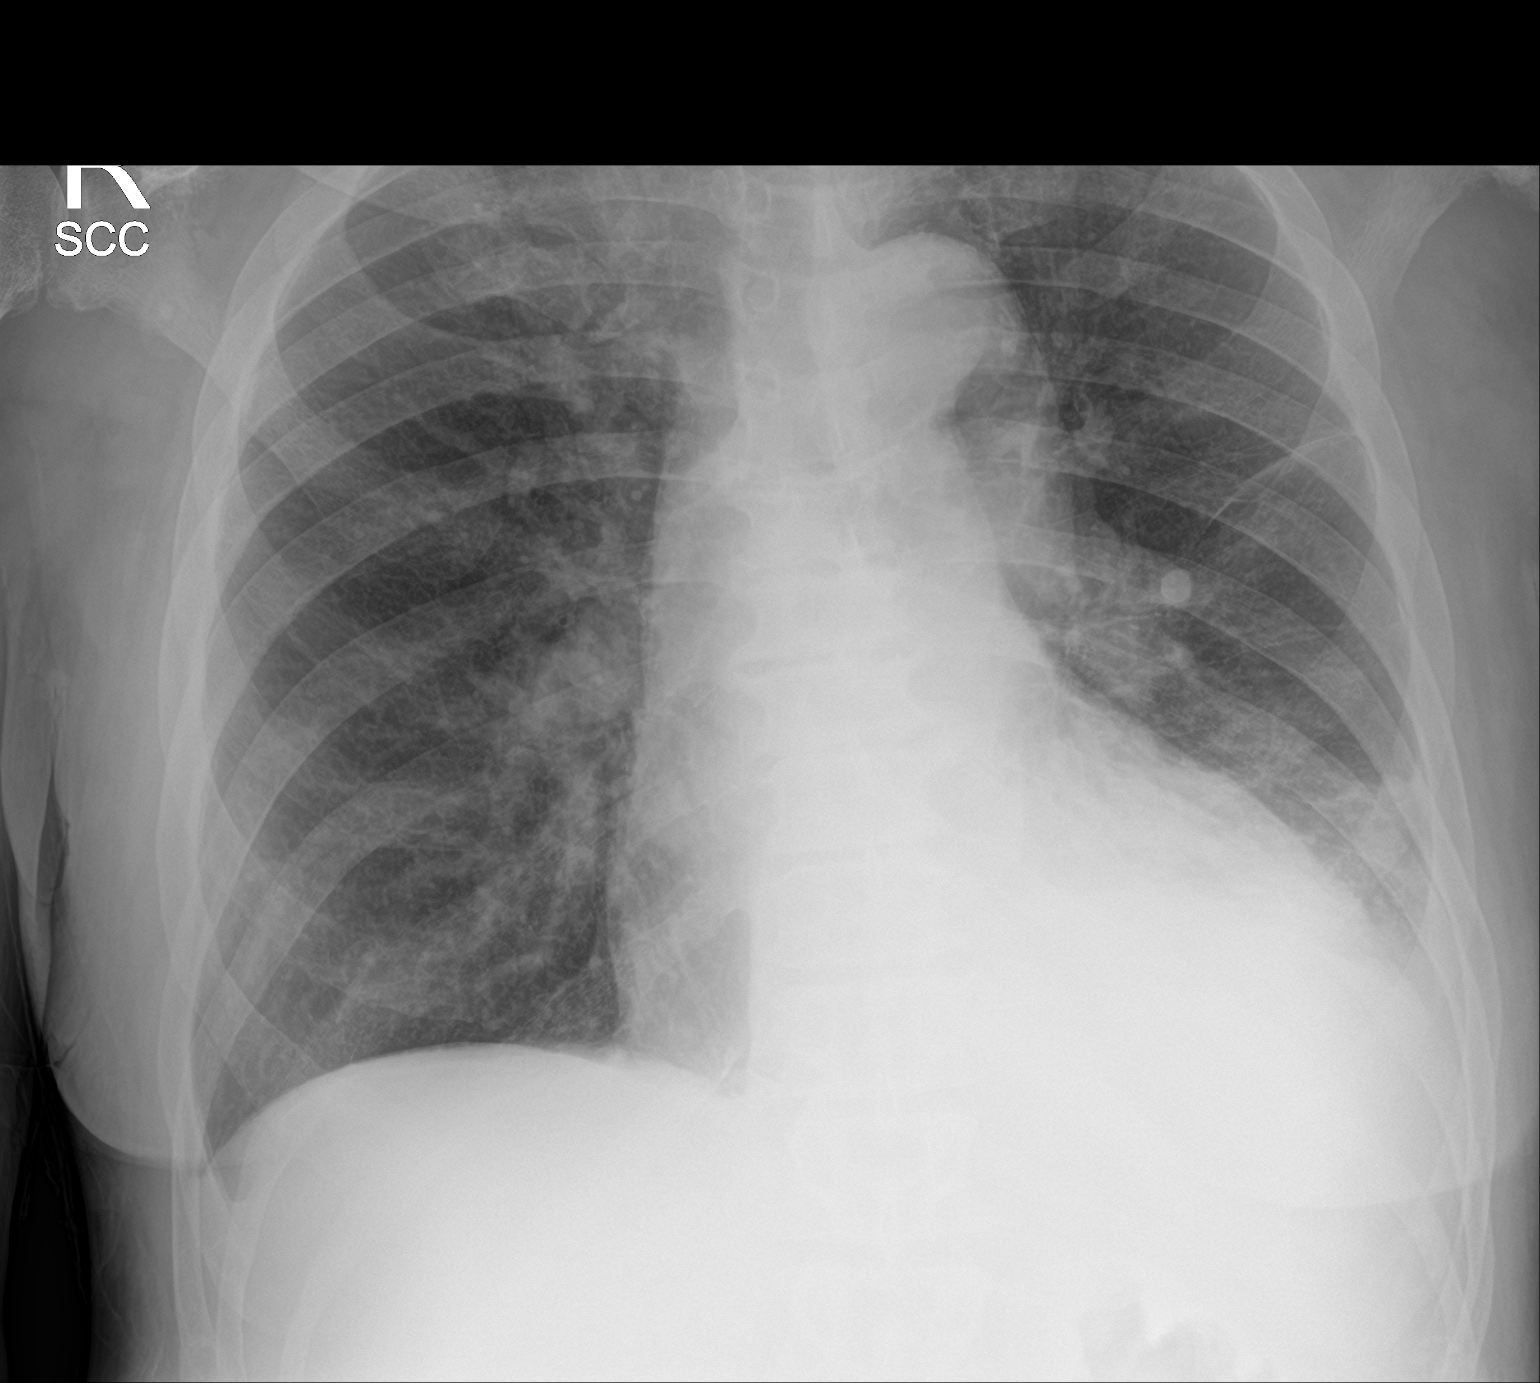

[2 of 2 positions shown; findings below may reference images not displayed]

FINDINGS: Small left-sided pleural effusion. Dense airspace disease at the
left base. Cardiomegaly with vascular congestion and mild
interstitial edema. Aortic atherosclerosis. No pneumothorax.
IMPRESSION: 1. Small left pleural effusion and left basilar atelectasis or
pneumonia, no change
2. Cardiomegaly with vascular congestion and mild pulmonary edema.

## 2019-11-25 IMAGING — DX DG CHEST 2V
2 series · 2 of 2 positions shown · non-contrast
Comparison: 05/27/2018

CLINICAL DATA: Chest pain and shortness of breath after fall today.

EXAM:
CHEST - 2 VIEW

[chest lat]
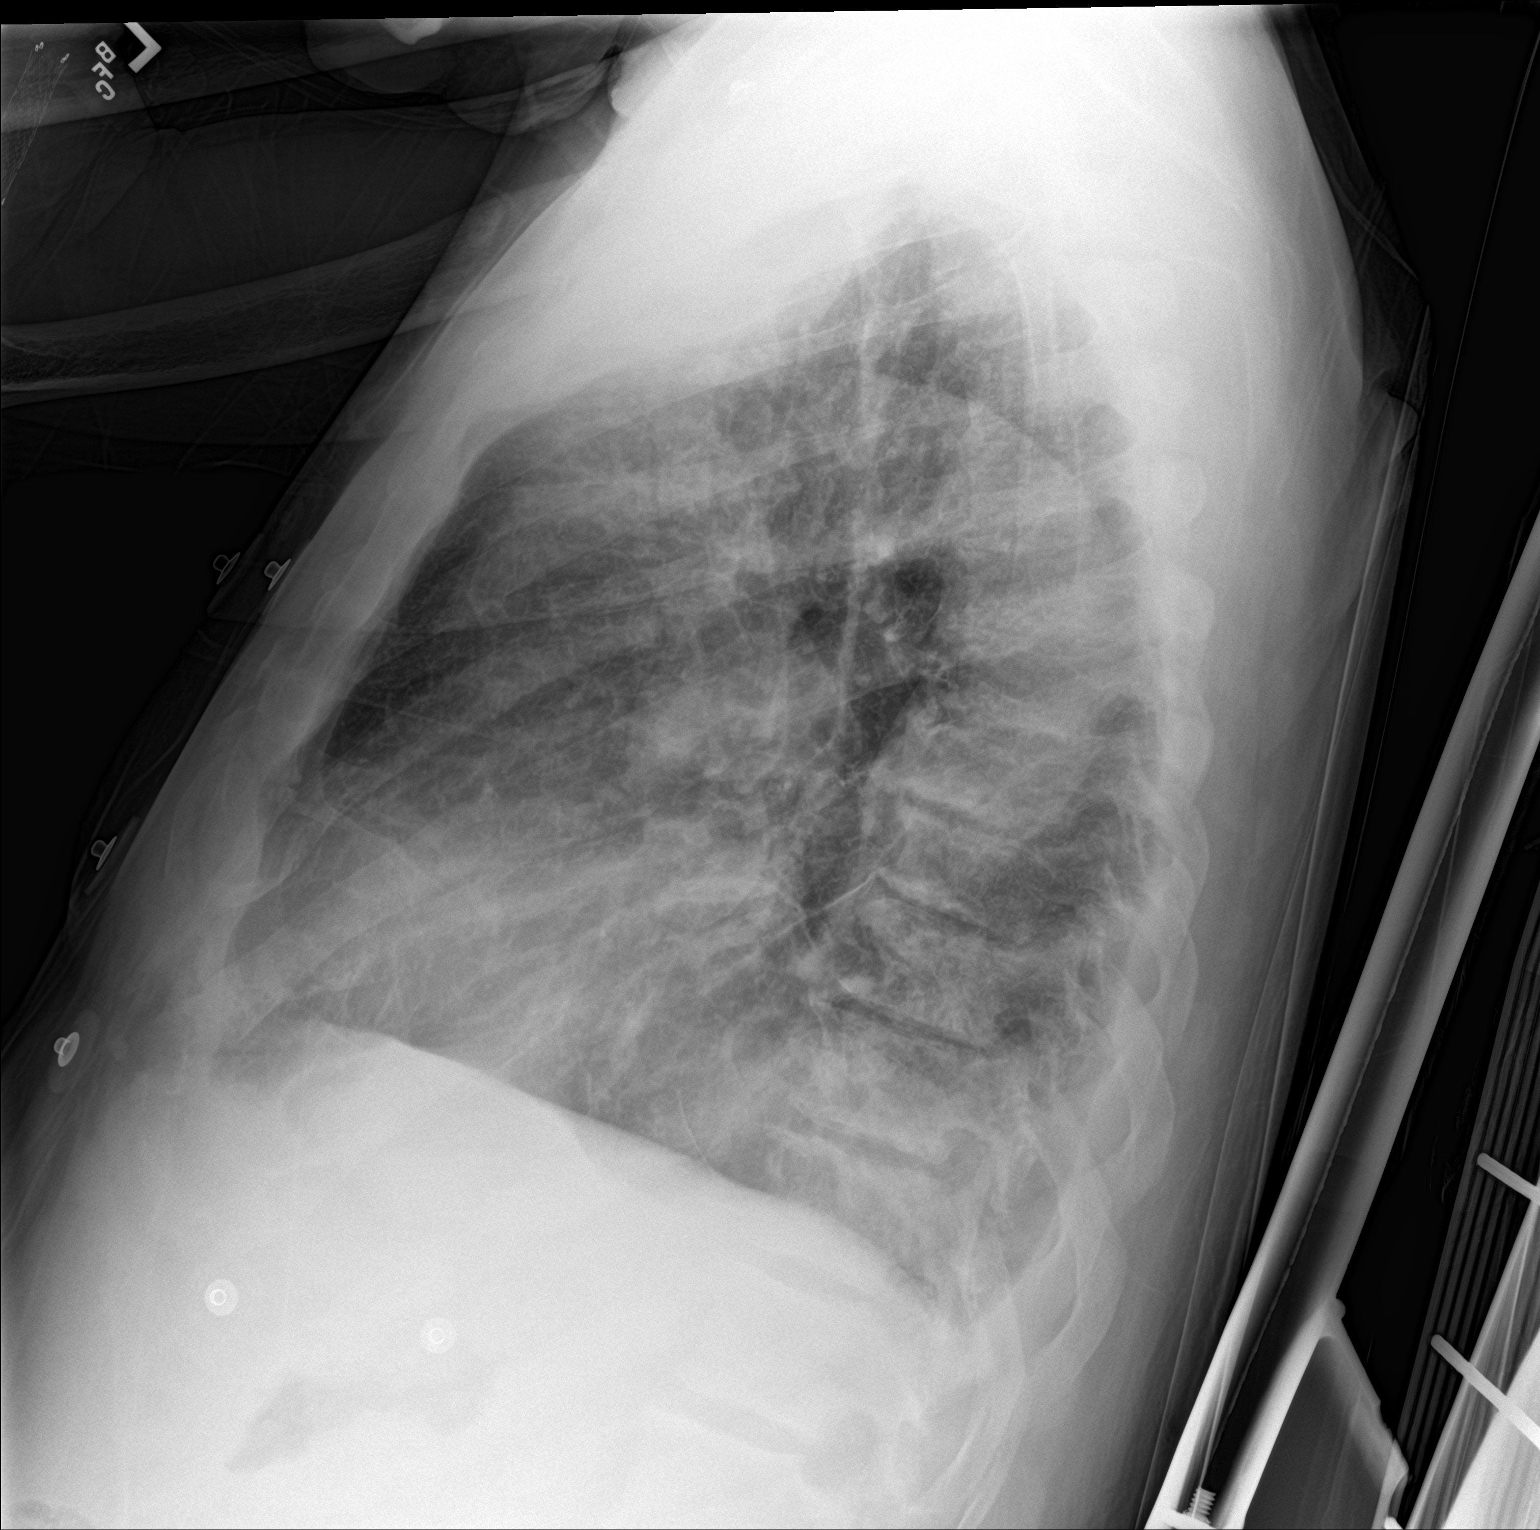

[chest ap]
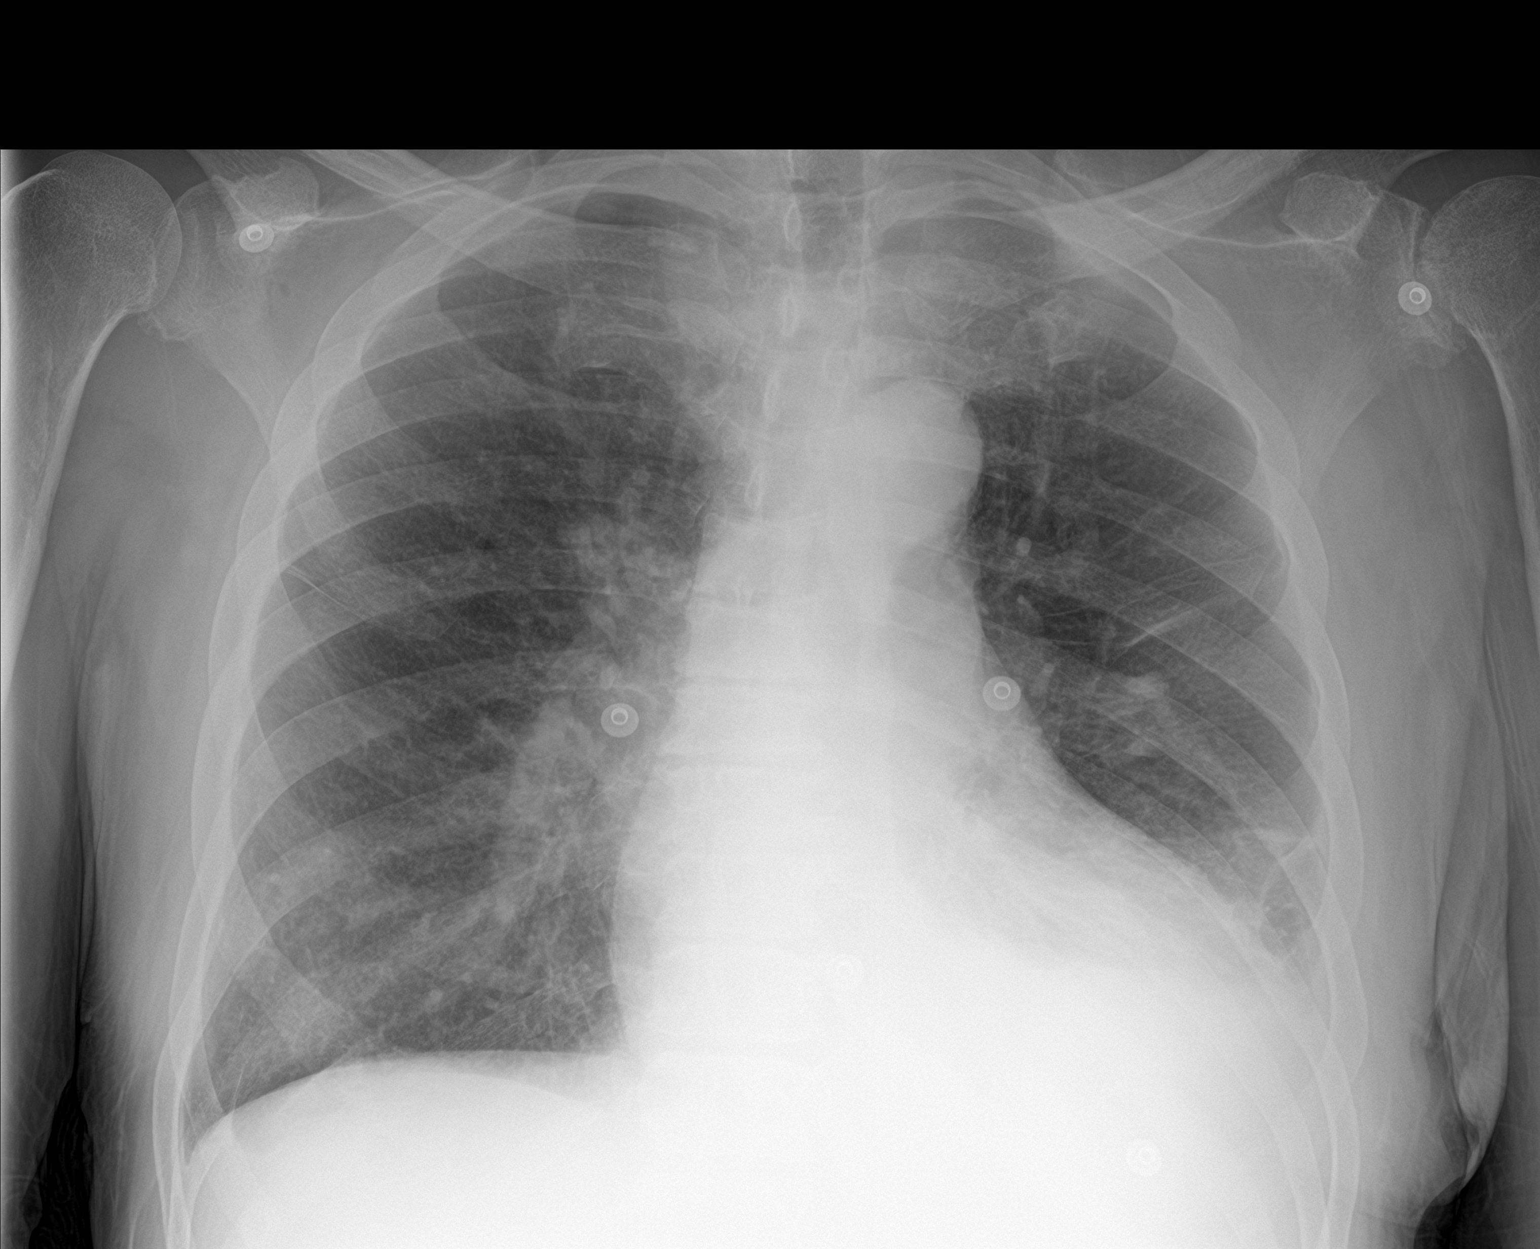

[2 of 2 positions shown; findings below may reference images not displayed]

FINDINGS: Unchanged cardiomegaly and mediastinal contours. Unchanged left
pleural effusion and basilar atelectasis/scarring. Unchanged
vascular congestion and pulmonary edema. No pneumothorax or new
focal airspace disease. No acute osseous abnormalities.
IMPRESSION: 1. No change from exam 6 days ago.
2. Unchanged cardiomegaly, left pleural effusion and basilar
opacity, and pulmonary edema.

## 2019-11-27 IMAGING — CT CT HEAD W/O CM
4 series · 16 of 47 positions shown, 18 images · non-contrast
Comparison: 04/11/2018

CLINICAL DATA: Fall, seizure, headache, encephalopathy

EXAM:
CT HEAD WITHOUT CONTRAST
TECHNIQUE: Contiguous axial images were obtained from the base of the skull
through the vertex without intravenous contrast.

[Series 3: head without · axial · non-contrast · 0.46mm/px · z∈[-127,-12]mm · 6 of 33 slices shown, 8 images]
[im 5/33  brain]
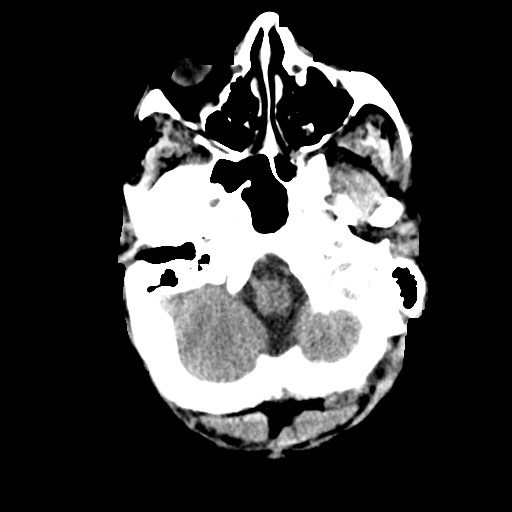
[im 5/33  bone]
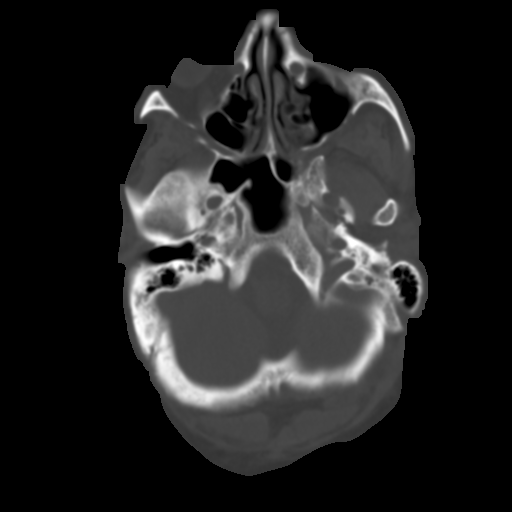
[im 10/33  brain]
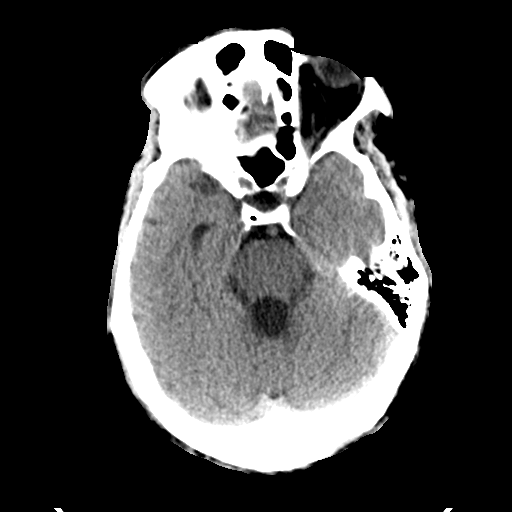
[im 14/33  brain]
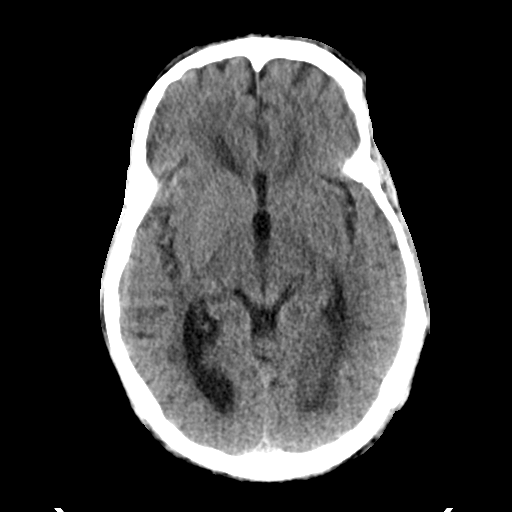
[im 19/33  brain]
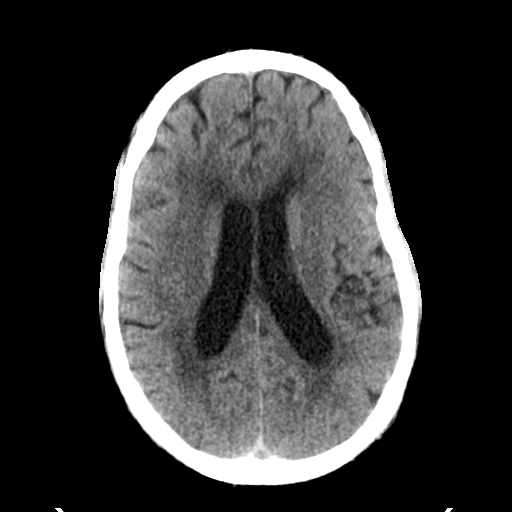
[im 23/33  brain]
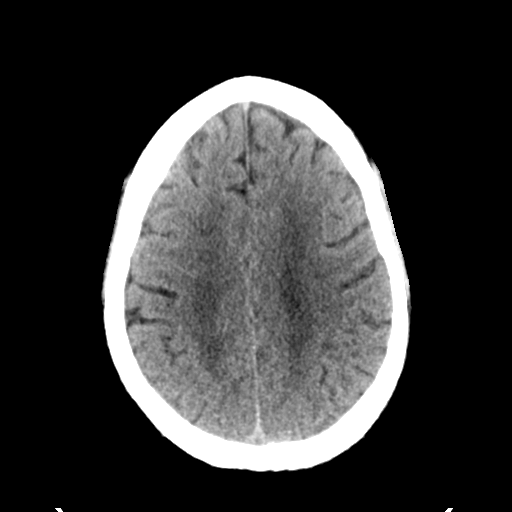
[im 23/33  bone]
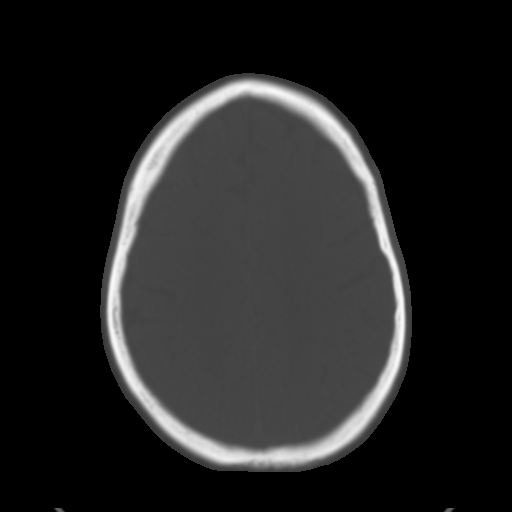
[im 28/33  brain]
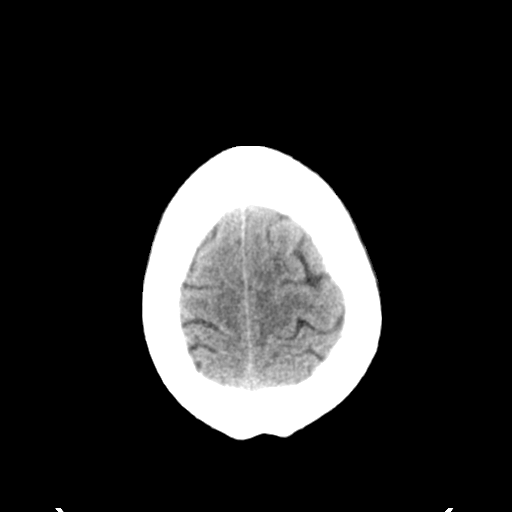

[Series 4: head bone · axial · 0.46mm/px · z∈[-133,-77]mm · 4 of 84 slices shown]
[im 8/84  bone]
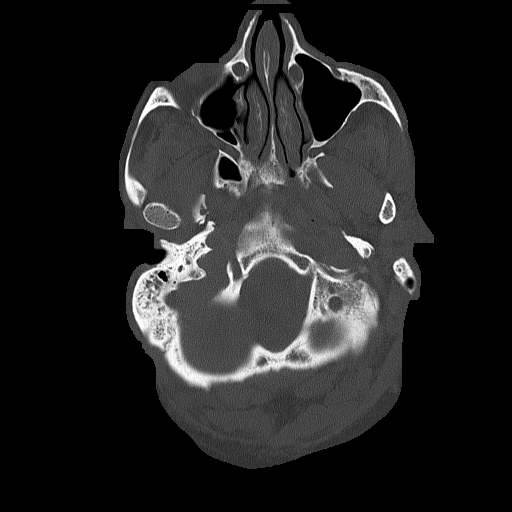
[im 16/84  bone]
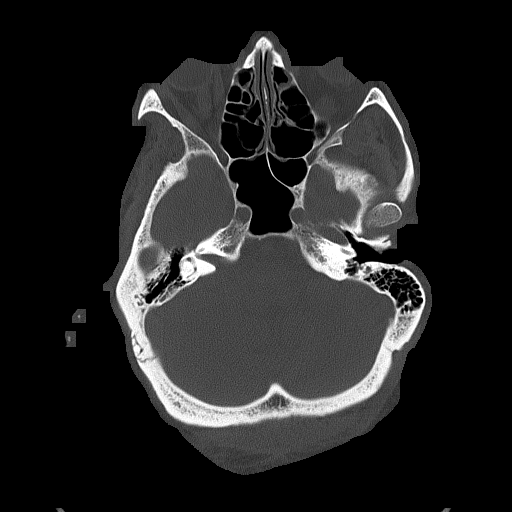
[im 28/84  bone]
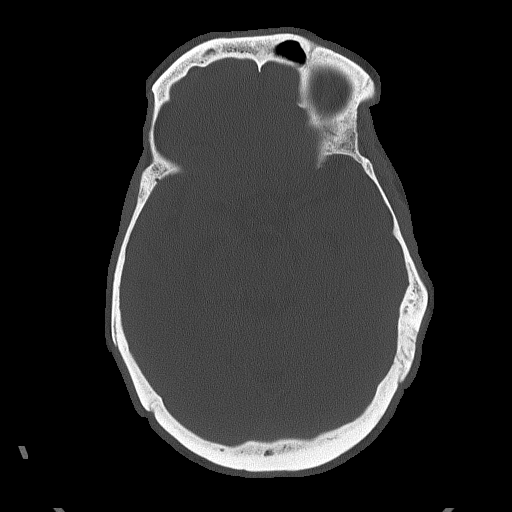
[im 36/84  bone]
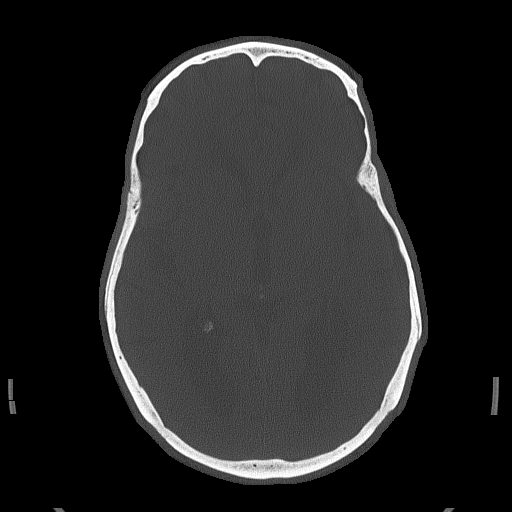

[Series 5: head without cor · coronal · non-contrast · 0.32mm/px · 3 of 73 slices shown]
[im 25/73  brain]
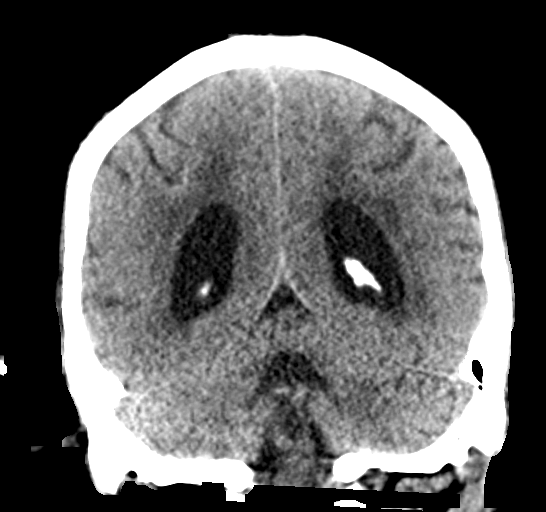
[im 33/73  brain]
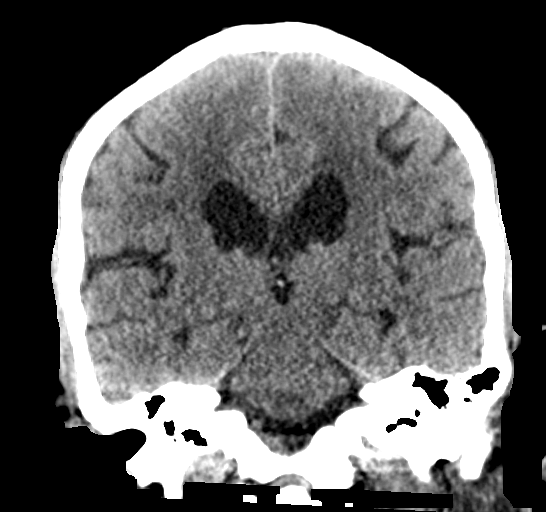
[im 41/73  brain]
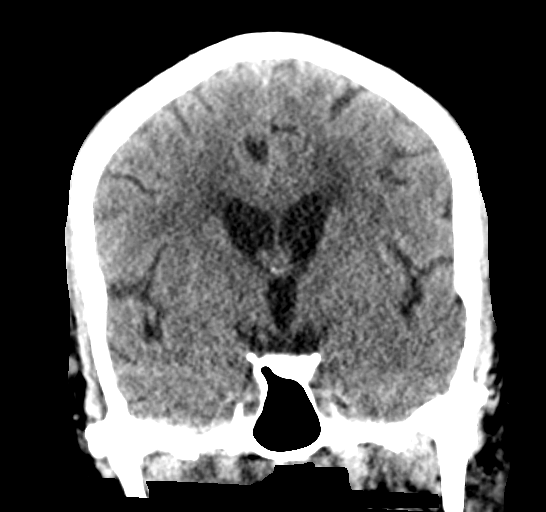

[Series 6: head without sag · sagittal · non-contrast · 0.33mm/px · 3 of 60 slices shown]
[im 20/60  brain]
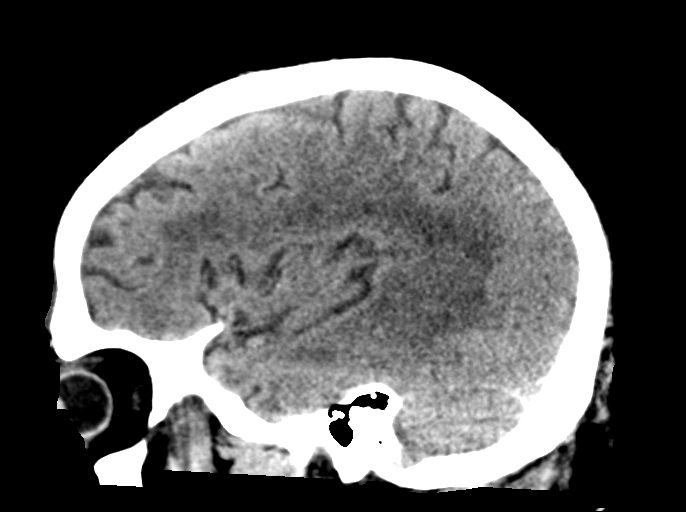
[im 30/60  brain]
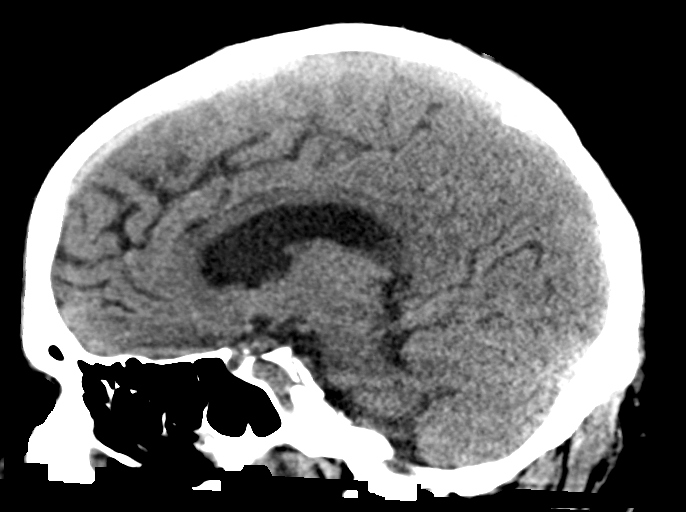
[im 40/60  brain]
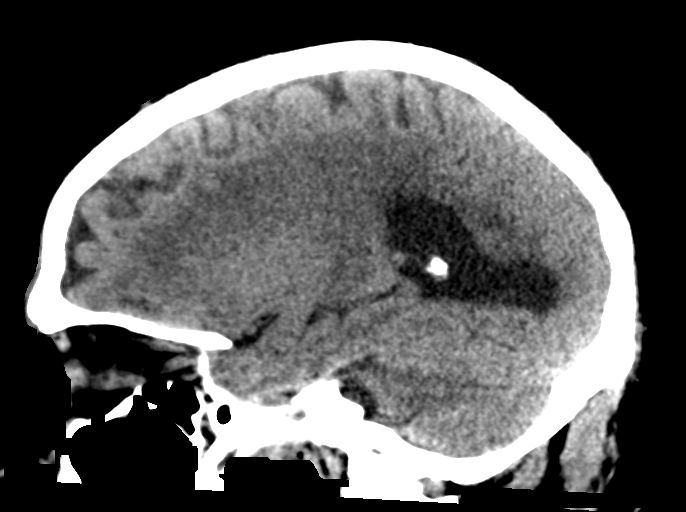

[16 of 47 positions shown; findings below may reference images not displayed]

FINDINGS: Brain: No evidence of acute infarction, hemorrhage, hydrocephalus,
extra-axial collection or mass lesion/mass effect.

Subcortical white matter and periventricular small vessel ischemic
changes.

Vascular: No hyperdense vessel or unexpected calcification.

Skull: Normal. Negative for fracture or focal lesion.

Sinuses/Orbits: The visualized paranasal sinuses are essentially
clear. The mastoid air cells are unopacified.

Other: None.
IMPRESSION: No evidence of acute intracranial abnormality.

Small vessel ischemic changes.

## 2019-11-27 IMAGING — MR MR LUMBAR SPINE W/O CM
4 of 5 series · 26 of 48 positions shown · non-contrast
Comparison: CT scan 04/01/2018 and prior lumbar spine MRI
09/12/2016

CLINICAL DATA: Fell 4 days ago. Persistent severe midline back pain
with bilateral leg pain.

EXAM:
MRI THORACIC AND LUMBAR SPINE WITHOUT CONTRAST
TECHNIQUE: Multiplanar and multiecho pulse sequences of the thoracic and lumbar
spine were obtained without intravenous contrast.

[Series 9: T2 · sagittal · 4.0mm · 0.73mm/px · 6 of 16 slices shown (1 of 2)]
[im 1/16]
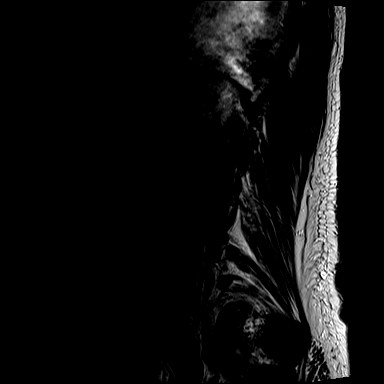
[im 4/16]
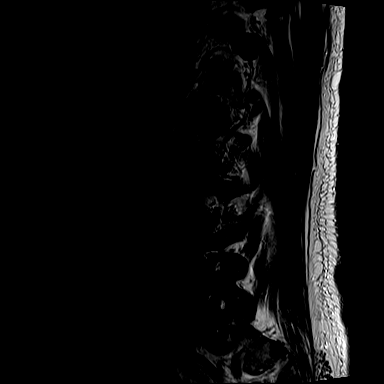
[im 7/16]
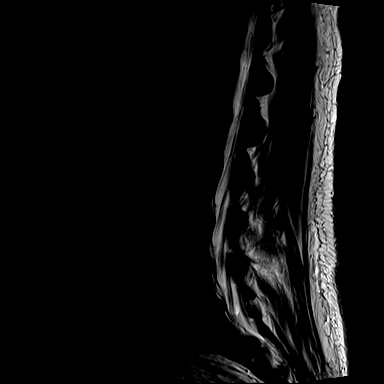
[im 10/16]
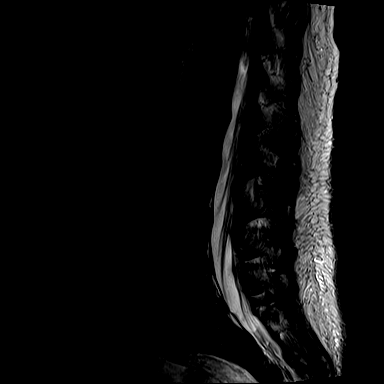
[im 13/16]
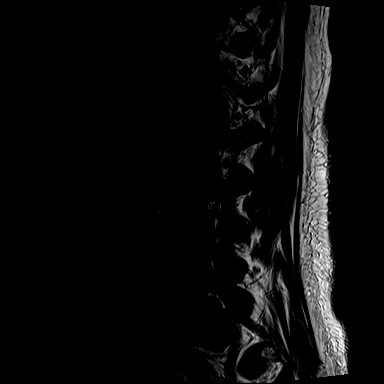
[im 16/16]
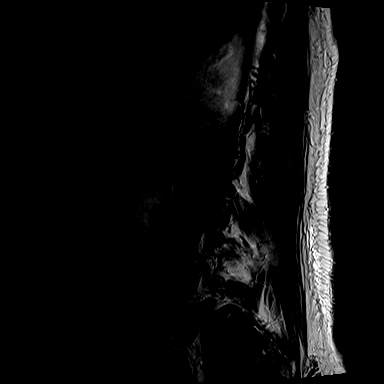

[Series 11: T1 · sagittal · 4.0mm · 0.88mm/px · 7 of 16 slices shown (1 of 2)]
[im 1/16]
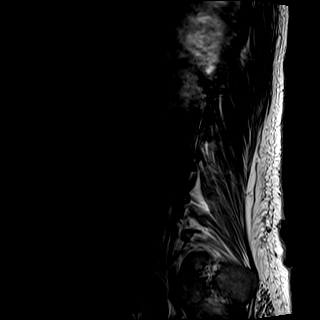
[im 3/16]
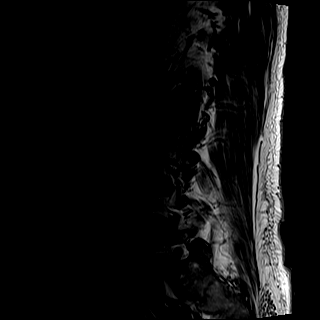
[im 6/16]
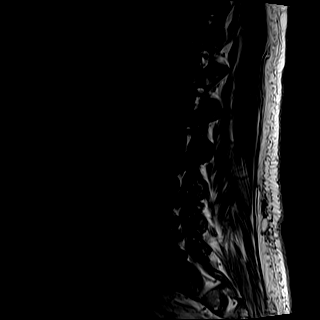
[im 8/16]
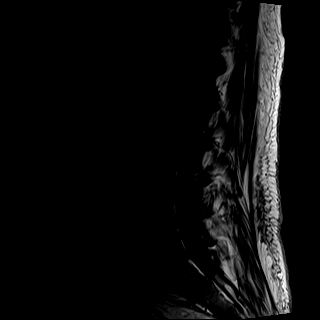
[im 11/16]
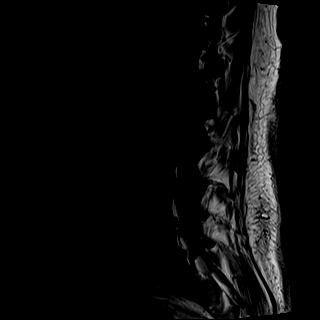
[im 13/16]
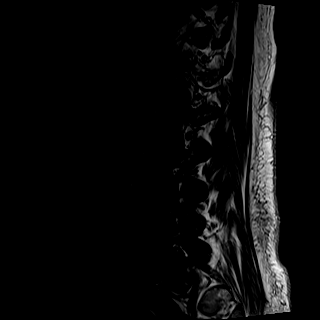
[im 16/16]
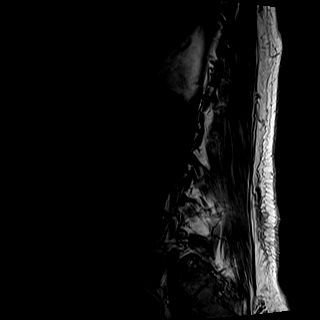

[Series 12: T2 · axial · 4.0mm · 0.57mm/px · z∈[-359,-173]mm · 8 of 35 slices shown (2 of 2)]
[im 1/35]
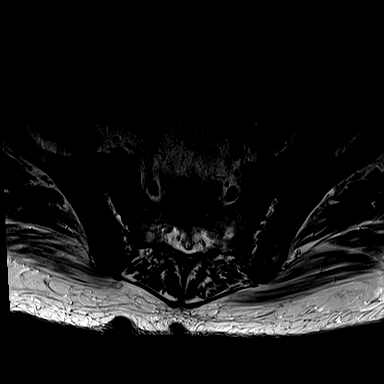
[im 6/35]
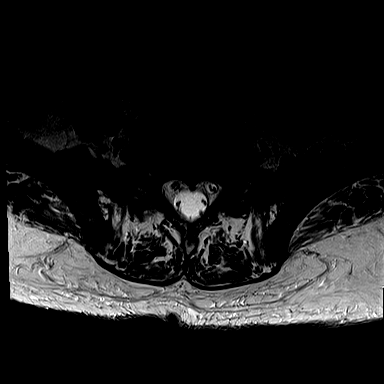
[im 11/35]
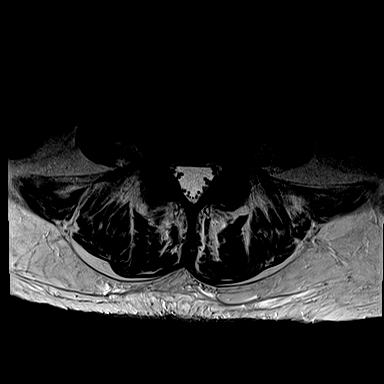
[im 16/35]
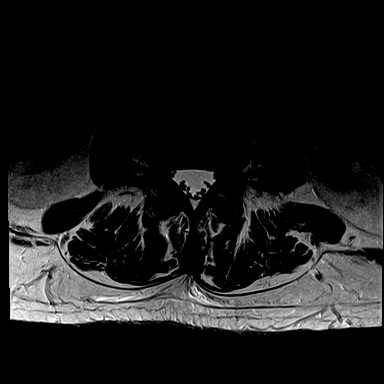
[im 19/35]
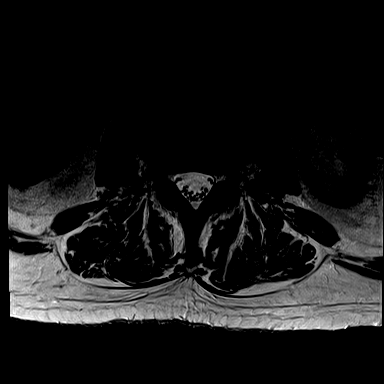
[im 24/35]
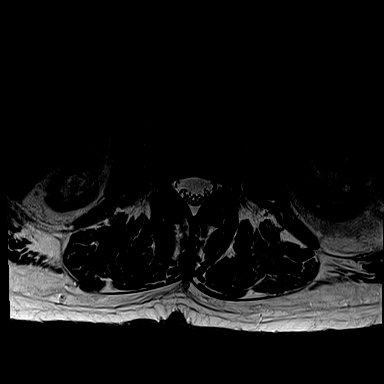
[im 29/35]
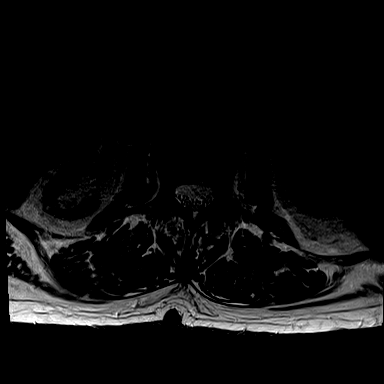
[im 35/35]
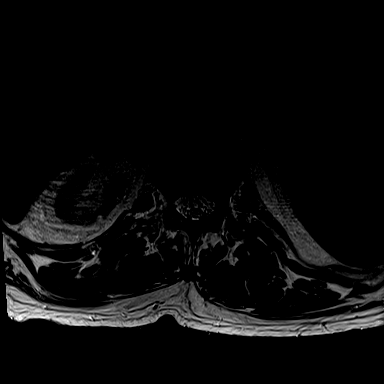

[Series 13: T1 · axial · 4.0mm · 0.34mm/px · z∈[-359,-203]mm · 5 of 35 slices shown (2 of 2)]
[im 1/35]
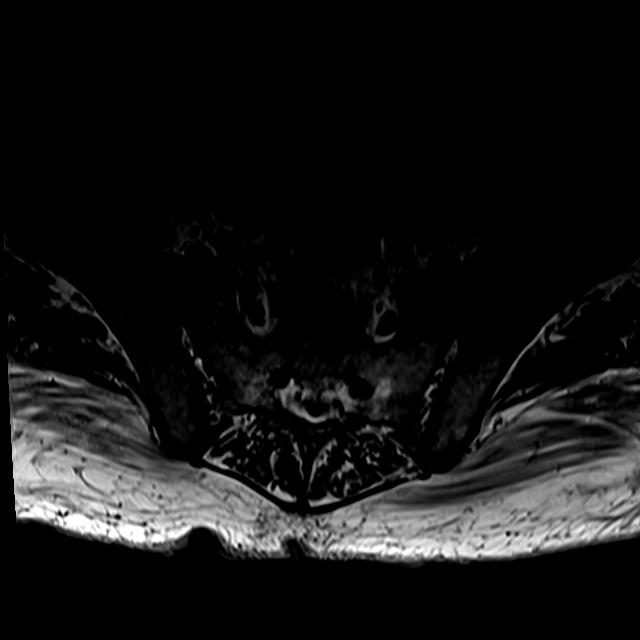
[im 6/35]
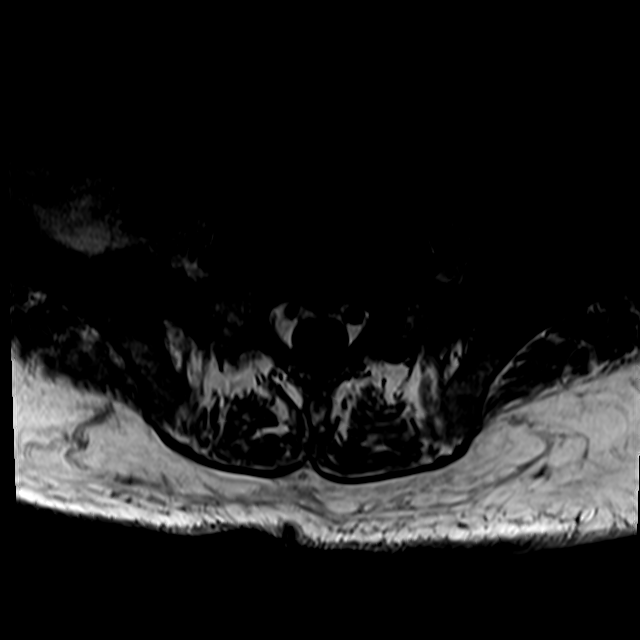
[im 11/35]
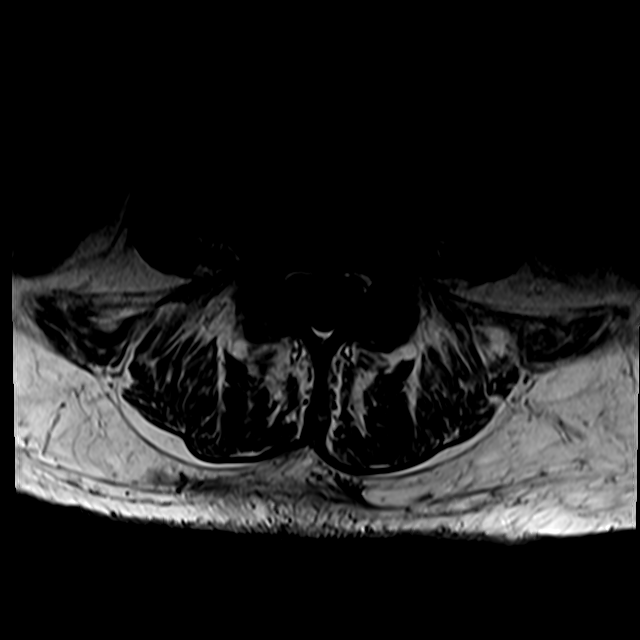
[im 19/35]
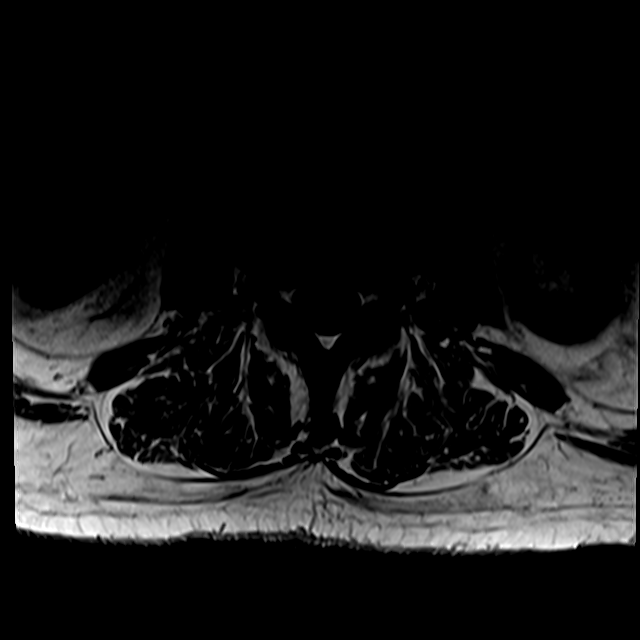
[im 29/35]
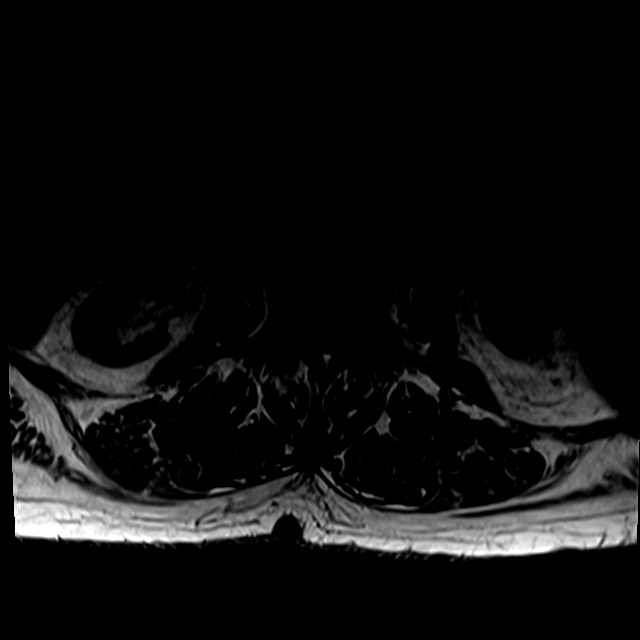

[26 of 48 positions shown; findings below may reference images not displayed]

FINDINGS: MRI THORACIC SPINE FINDINGS

Alignment:  Normal overall alignment.

Vertebrae: No acute thoracic spine fracture or worrisome bone
lesion. There are moderate to advanced mid to lower thoracic
degenerative changes with fatty endplate reactive changes.

Cord: Normal appearance of the thoracic spinal cord. No cord lesions
or syrinx.

Paraspinal and other soft tissues: No significant findings. There is
a small left pleural effusion noted.

Disc levels:

No significant thoracic disc protrusions or spinal canal compromise.
Diffuse bulging degenerated discs are noted in the lower thoracic
spine with mild impression on the thecal sac. No foraminal lesions.

Of note, at C7-T1 there is a left paracentral disc protrusion with
mass effect on the left side of thecal sac but no obvious foraminal
stenosis.

MRI LUMBAR SPINE FINDINGS

Segmentation: There are five lumbar type vertebral bodies. The last
full intervertebral disc space is labeled L5-S1. This correlates
with the prior MR examination.

Alignment:  Normal

Vertebrae: Advanced degenerative changes in the lower lumbar spine
with degenerative disc disease and endplate reactive changes. Low T1
and T2 signal intensity likely due to renal osteodystrophy. No
worrisome bone lesions or acute fracture.

Conus medullaris and cauda equina: Conus extends to the L1 level.
Conus and cauda equina appear normal.

Paraspinal and other soft tissues: No significant paraspinal or
retroperitoneal findings. Mild edema like signal abnormality on the
STIR sequence in the paraspinal muscles could suggest muscle strain
or partial tear.

Small kidneys with multiple cysts are noted. No retroperitoneal
process.

Disc levels:

No disc protrusions, spinal or foraminal stenosis.
IMPRESSION: MR THORACIC SPINE IMPRESSION

1. Degenerative changes in the mid to lower thoracic spine with
bulging discs but no significant canal compromise or neural
compression.
2. Normal appearance of the thoracic spinal cord.
3. Left paracentral disc protrusion noted at C7-T1.
4. Left pleural effusion.

MR LUMBAR SPINE IMPRESSION

1. Stable appearance of the lumbar spine. Degenerate disc disease
noted mainly at L4-5 and L5-S1 but no acute bony findings or
worrisome bone lesion.
2. No disc protrusions, spinal or foraminal stenosis.
3. Mild edema like signal abnormality in the paraspinal muscles
could suggest a muscle strain or partial tear.

## 2019-11-27 IMAGING — MR MR THORACIC SPINE W/O CM
5 of 6 series · 23 of 48 positions shown · non-contrast
Comparison: CT scan 04/01/2018 and prior lumbar spine MRI
09/12/2016

CLINICAL DATA: Fell 4 days ago. Persistent severe midline back pain
with bilateral leg pain.

EXAM:
MRI THORACIC AND LUMBAR SPINE WITHOUT CONTRAST
TECHNIQUE: Multiplanar and multiecho pulse sequences of the thoracic and lumbar
spine were obtained without intravenous contrast.

[Series 14: T1 · sagittal · 6.0mm · 1.23mm/px · 2 of 9 slices shown (1 of 2)]
[im 1/9]
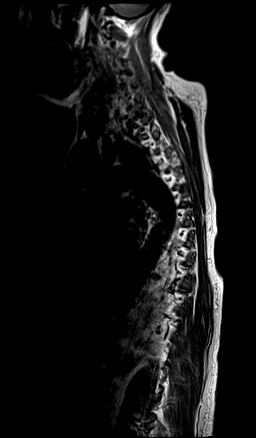
[im 9/9]
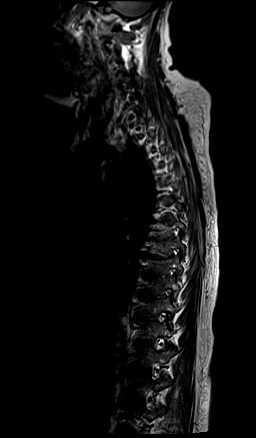

[Series 15: T2 · sagittal · 3.0mm · 0.76mm/px · 6 of 19 slices shown (1 of 2)]
[im 1/19]
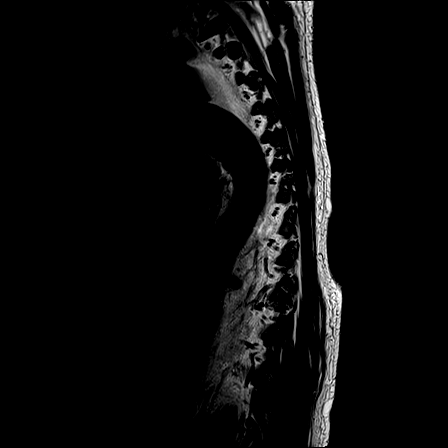
[im 4/19]
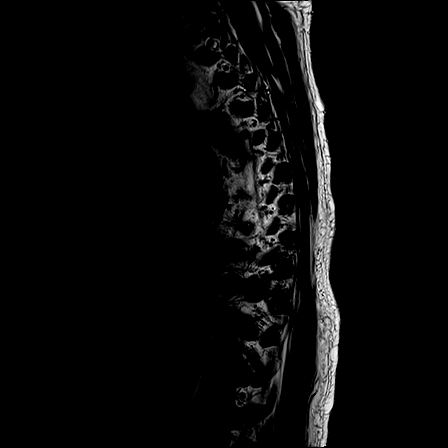
[im 8/19]
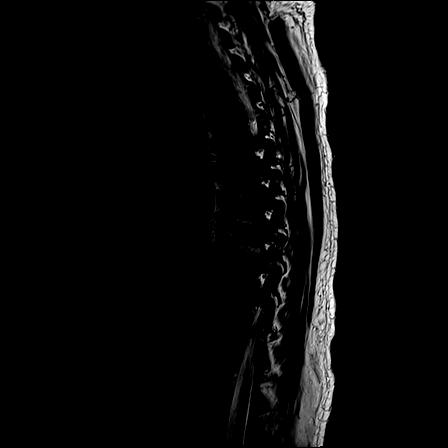
[im 11/19]
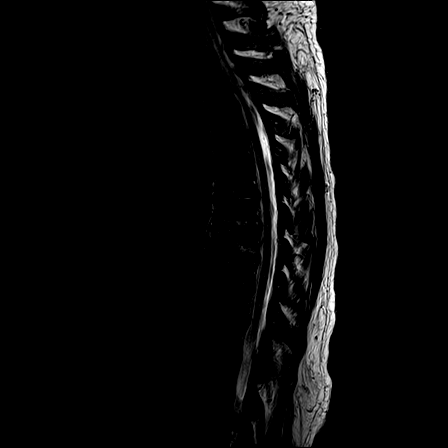
[im 15/19]
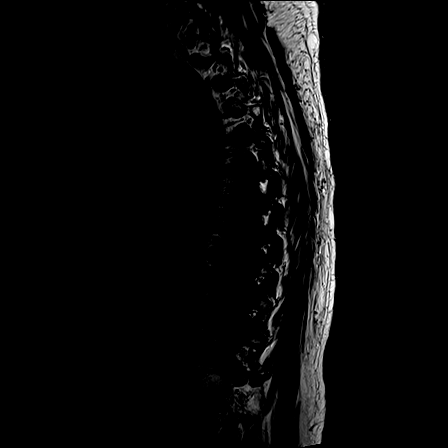
[im 19/19]
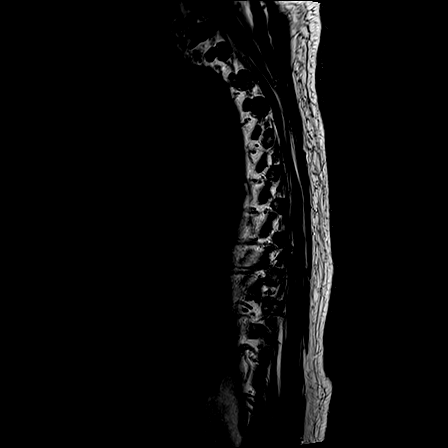

[Series 16: T1 · sagittal · 3.0mm · 0.76mm/px · 6 of 19 slices shown (2 of 2)]
[im 1/19]
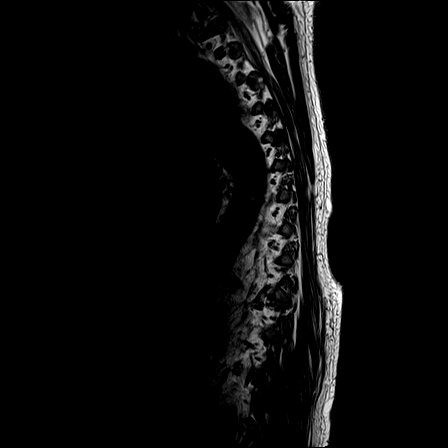
[im 4/19]
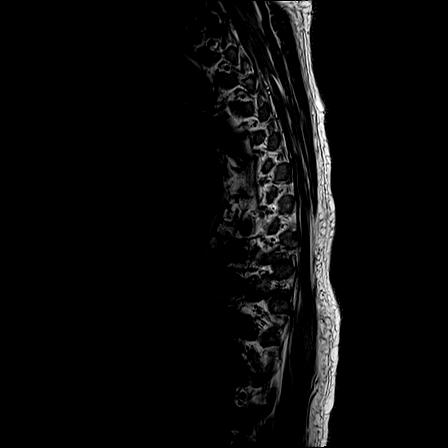
[im 8/19]
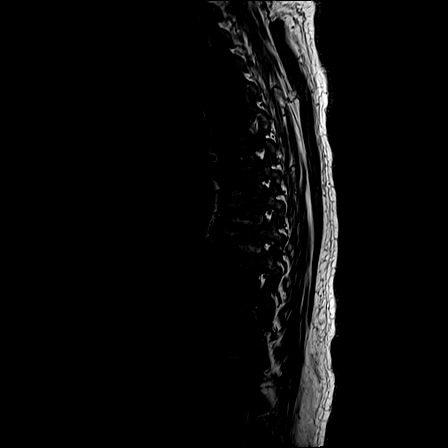
[im 11/19]
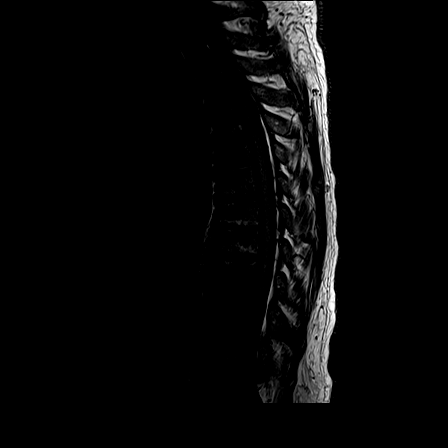
[im 15/19]
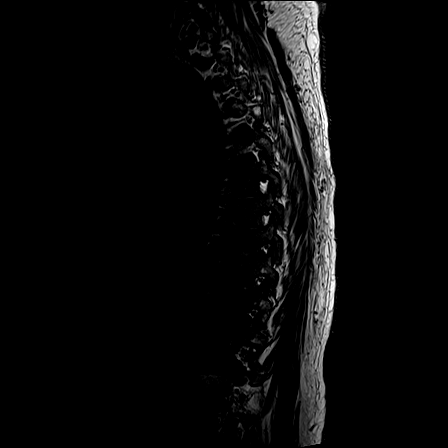
[im 19/19]
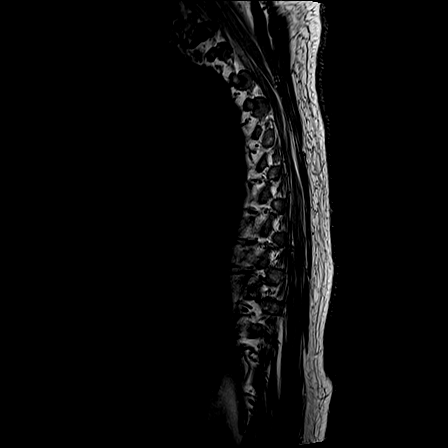

[Series 17: STIR · sagittal · 3.0mm · 0.38mm/px · 1 of 19 slices shown]
[im 1/19]
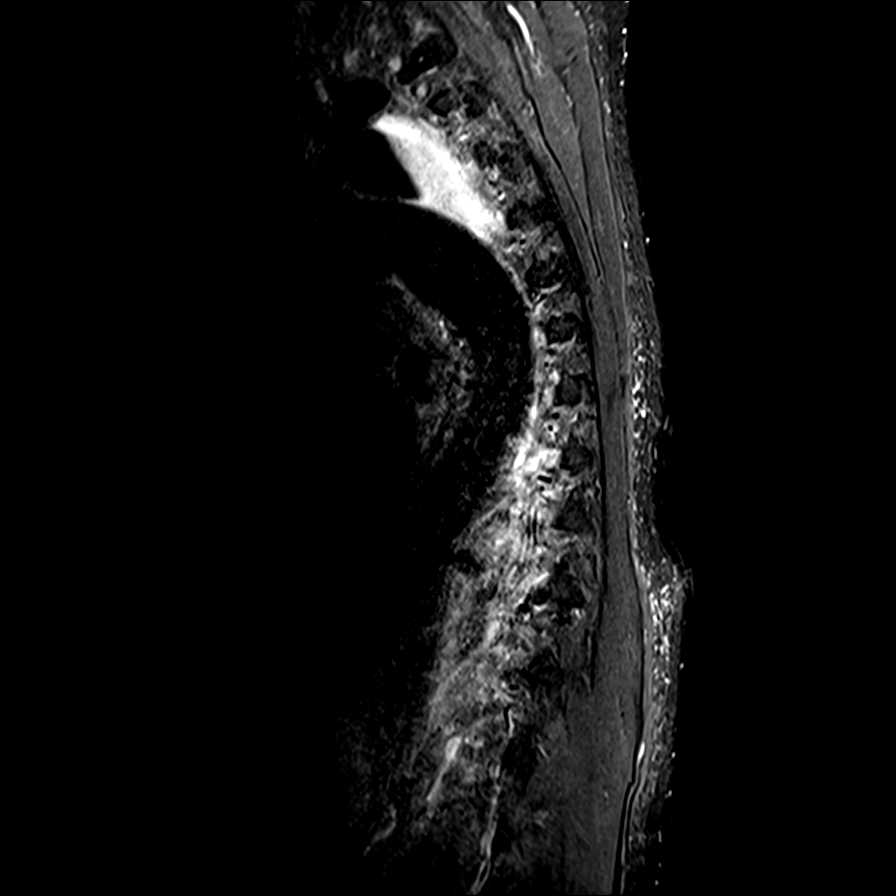

[Series 18: T2 · axial · 4.0mm · 0.59mm/px · z∈[-235,+2]mm · 8 of 43 slices shown (2 of 2)]
[im 1/43]
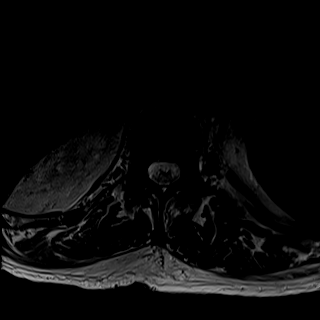
[im 7/43]
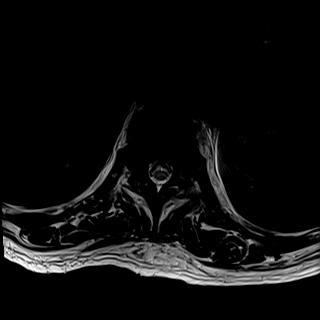
[im 13/43]
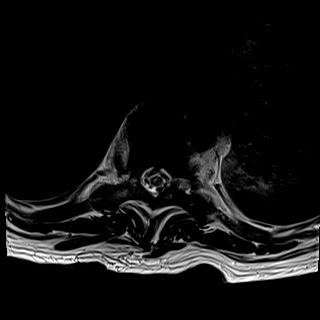
[im 20/43]
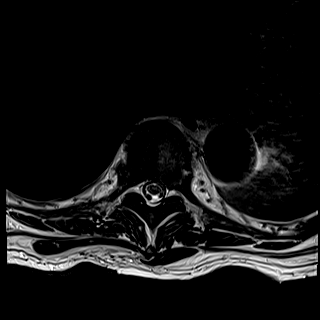
[im 23/43]
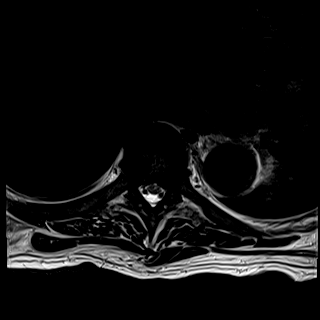
[im 30/43]
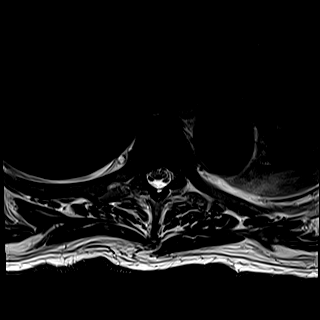
[im 36/43]
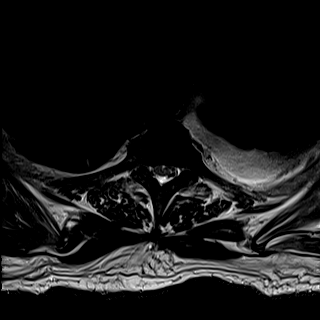
[im 43/43]
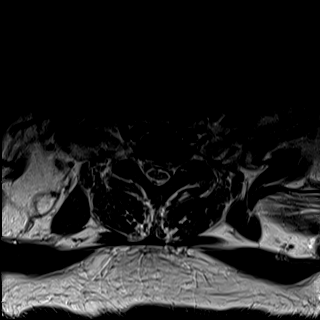

[23 of 48 positions shown; findings below may reference images not displayed]

FINDINGS: MRI THORACIC SPINE FINDINGS

Alignment:  Normal overall alignment.

Vertebrae: No acute thoracic spine fracture or worrisome bone
lesion. There are moderate to advanced mid to lower thoracic
degenerative changes with fatty endplate reactive changes.

Cord: Normal appearance of the thoracic spinal cord. No cord lesions
or syrinx.

Paraspinal and other soft tissues: No significant findings. There is
a small left pleural effusion noted.

Disc levels:

No significant thoracic disc protrusions or spinal canal compromise.
Diffuse bulging degenerated discs are noted in the lower thoracic
spine with mild impression on the thecal sac. No foraminal lesions.

Of note, at C7-T1 there is a left paracentral disc protrusion with
mass effect on the left side of thecal sac but no obvious foraminal
stenosis.

MRI LUMBAR SPINE FINDINGS

Segmentation: There are five lumbar type vertebral bodies. The last
full intervertebral disc space is labeled L5-S1. This correlates
with the prior MR examination.

Alignment:  Normal

Vertebrae: Advanced degenerative changes in the lower lumbar spine
with degenerative disc disease and endplate reactive changes. Low T1
and T2 signal intensity likely due to renal osteodystrophy. No
worrisome bone lesions or acute fracture.

Conus medullaris and cauda equina: Conus extends to the L1 level.
Conus and cauda equina appear normal.

Paraspinal and other soft tissues: No significant paraspinal or
retroperitoneal findings. Mild edema like signal abnormality on the
STIR sequence in the paraspinal muscles could suggest muscle strain
or partial tear.

Small kidneys with multiple cysts are noted. No retroperitoneal
process.

Disc levels:

No disc protrusions, spinal or foraminal stenosis.
IMPRESSION: MR THORACIC SPINE IMPRESSION

1. Degenerative changes in the mid to lower thoracic spine with
bulging discs but no significant canal compromise or neural
compression.
2. Normal appearance of the thoracic spinal cord.
3. Left paracentral disc protrusion noted at C7-T1.
4. Left pleural effusion.

MR LUMBAR SPINE IMPRESSION

1. Stable appearance of the lumbar spine. Degenerate disc disease
noted mainly at L4-5 and L5-S1 but no acute bony findings or
worrisome bone lesion.
2. No disc protrusions, spinal or foraminal stenosis.
3. Mild edema like signal abnormality in the paraspinal muscles
could suggest a muscle strain or partial tear.

## 2019-11-27 IMAGING — DX DG CHEST 2V
2 series · 2 of 2 positions shown · non-contrast
Comparison: Chest x-rays dated 06/02/2018, 05/27/2018, 05/02/2018
and 09/10/2016.

CLINICAL DATA: Fall on [REDACTED], continued back pain. Patient states
he has a heart infection

EXAM:
CHEST - 2 VIEW

[chest lat]
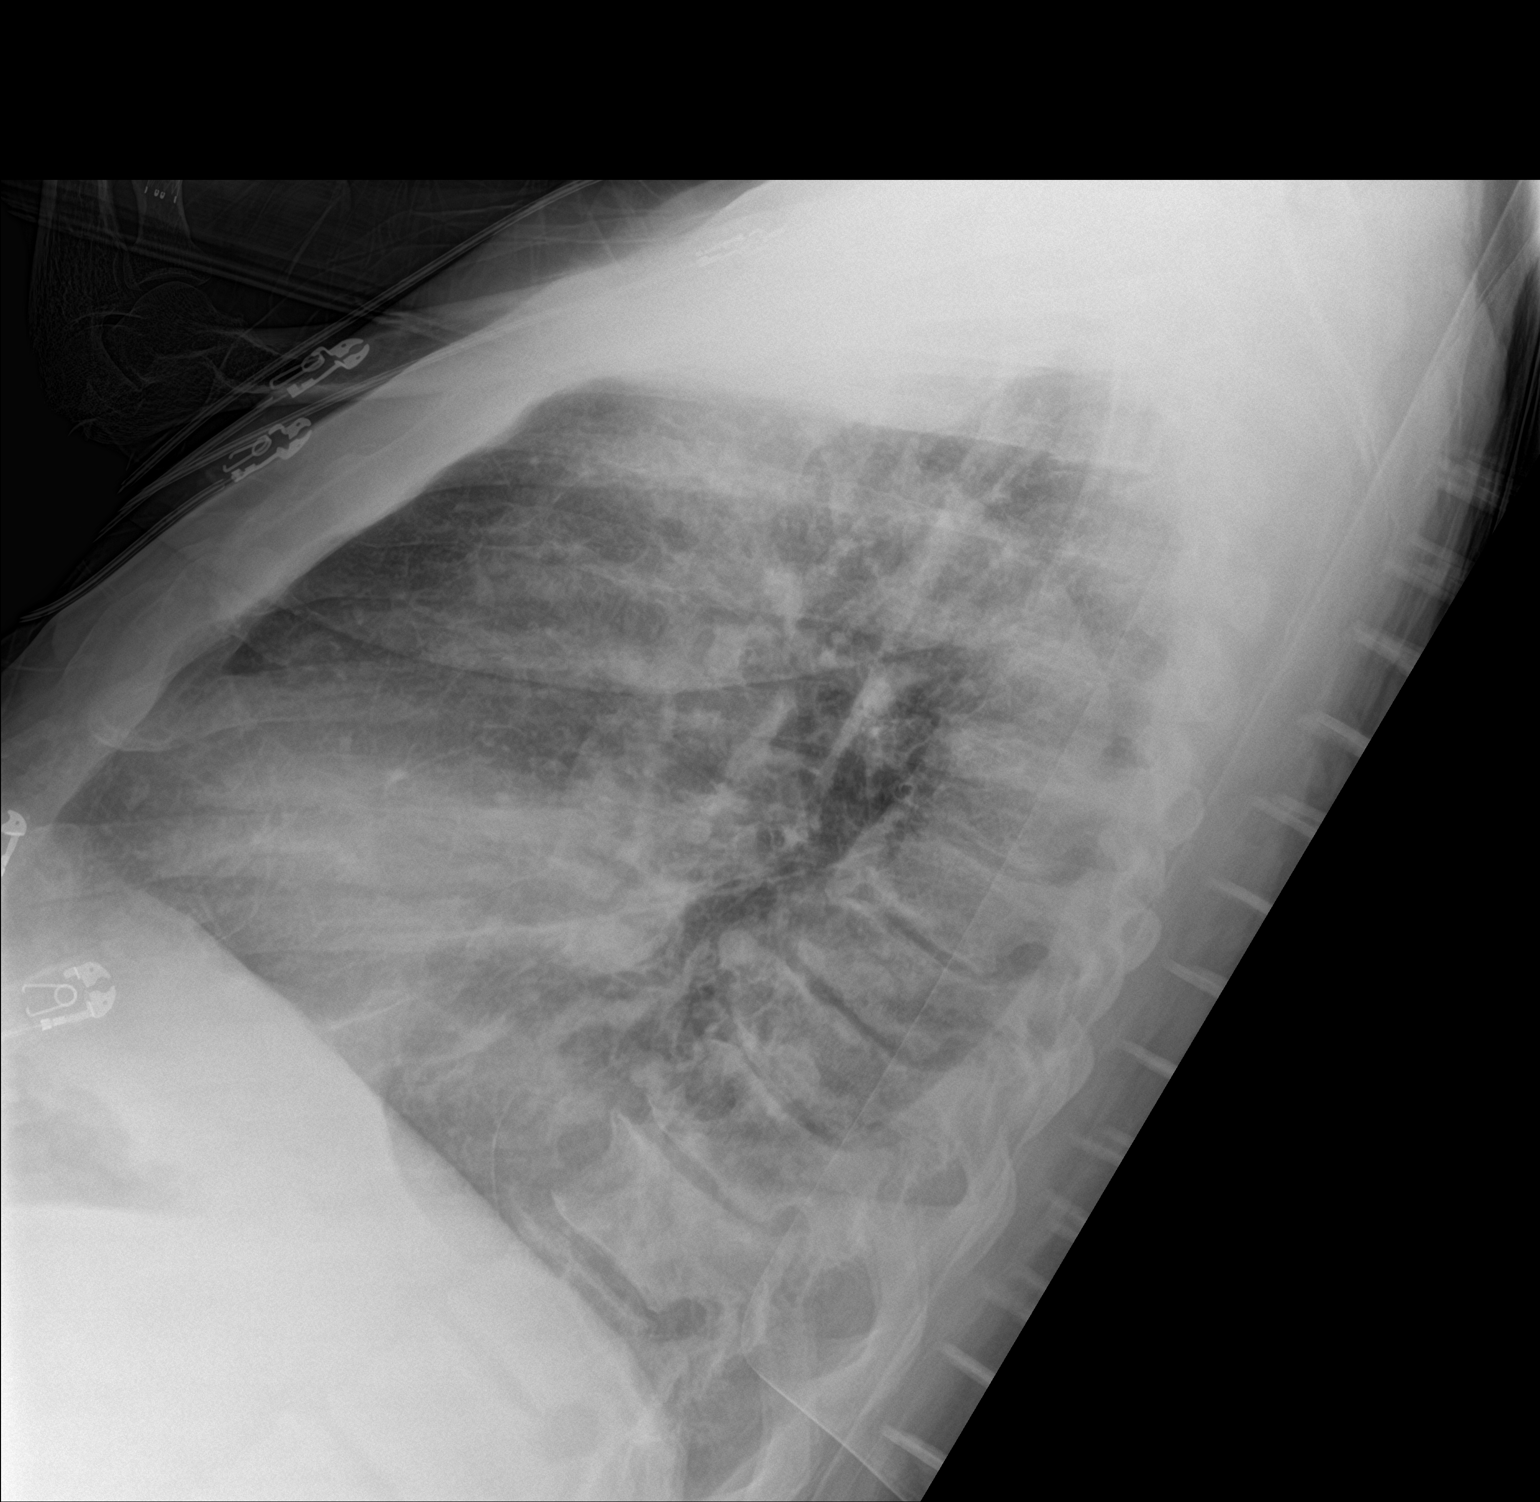

[chest ap]
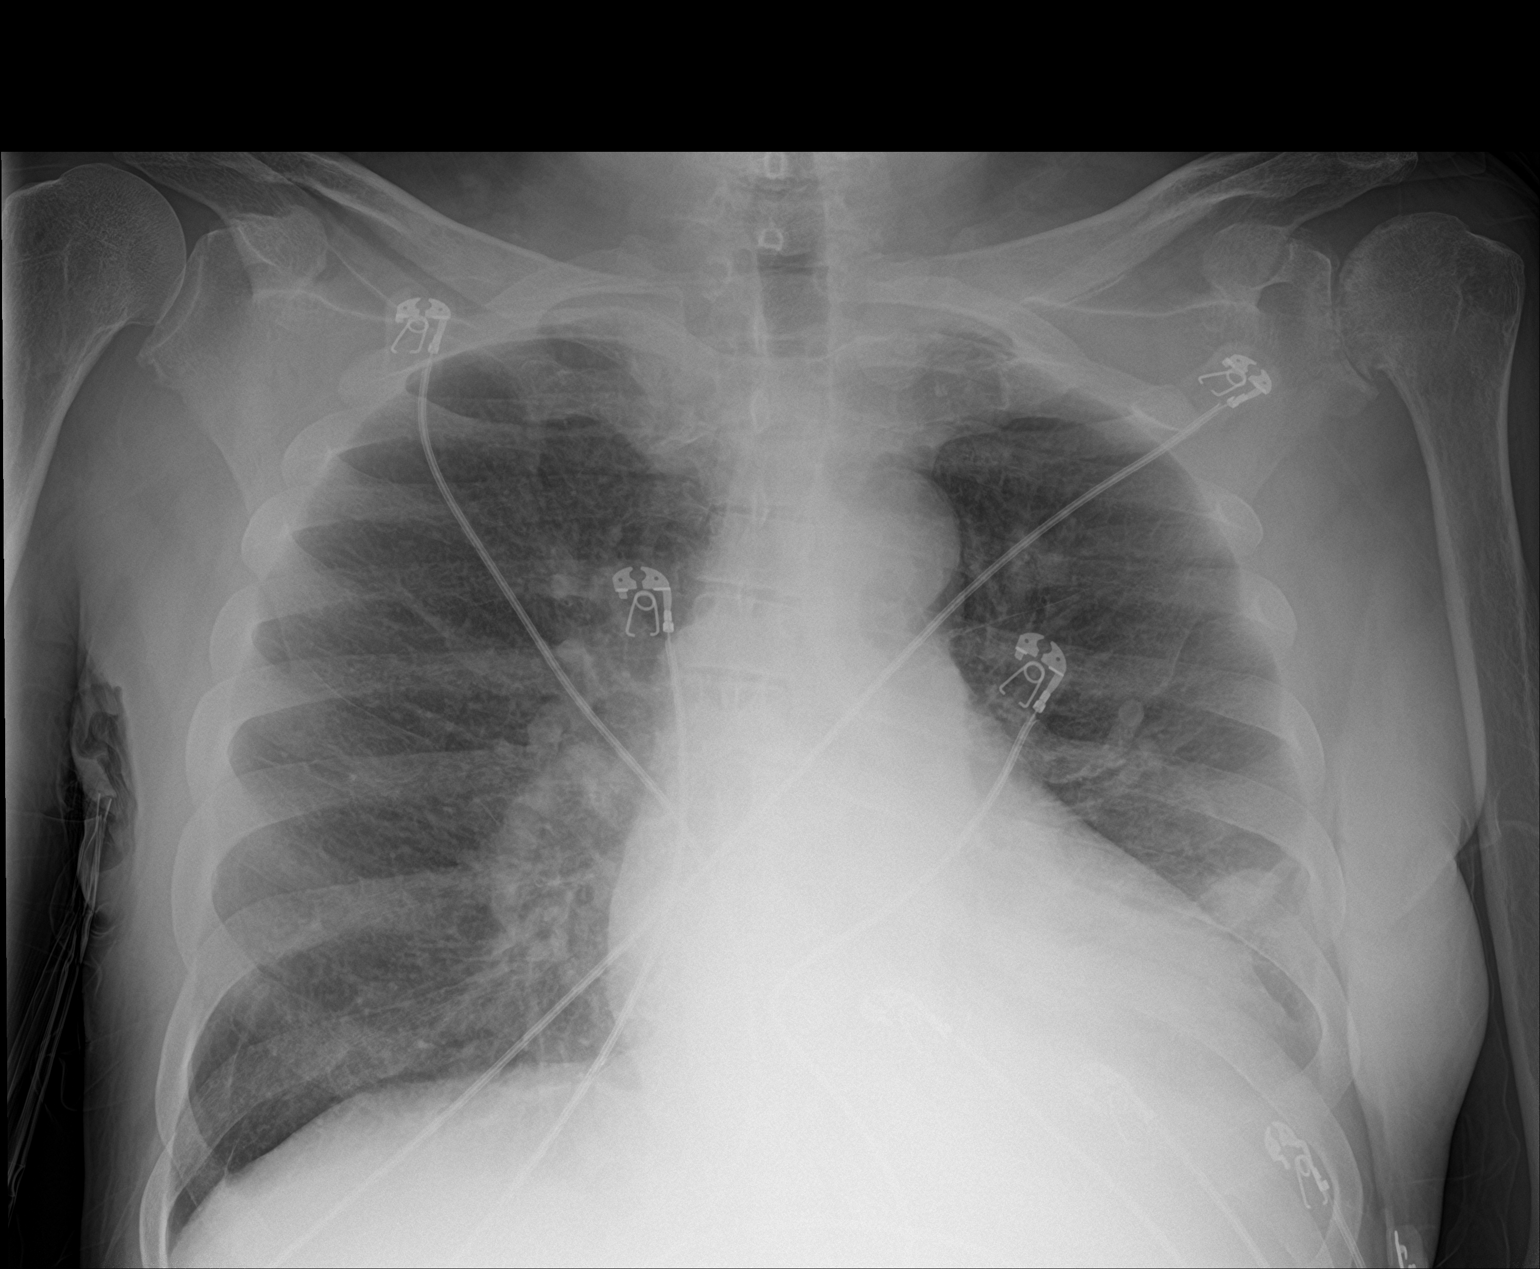

[2 of 2 positions shown; findings below may reference images not displayed]

FINDINGS: Stable cardiomegaly. Chronic central pulmonary vascular congestion
and mild bilateral interstitial thickening, presumed interstitial
edema. Small LEFT pleural effusion. No new lung findings. No
pneumothorax seen.

Degenerative endplate spurring throughout the thoracic spine. No
acute or suspicious osseous finding.
IMPRESSION: 1. Stable chest x-ray.  Persistent small LEFT pleural effusion.
2. Chronic mild CHF with associated central pulmonary vascular
congestion and interstitial edema.
3. Stable cardiomegaly.
4. No acute appearing osseous abnormality. Chronic degenerative
endplate spurring throughout the thoracic spine.

## 2019-12-27 IMAGING — DX DG CHEST 1V PORT
1 series · 1 of 1 positions shown · non-contrast
Comparison: 06/04/2018

CLINICAL DATA: Postoperative follow-up of fistula repair.

EXAM:
PORTABLE CHEST 1 VIEW

[chest]
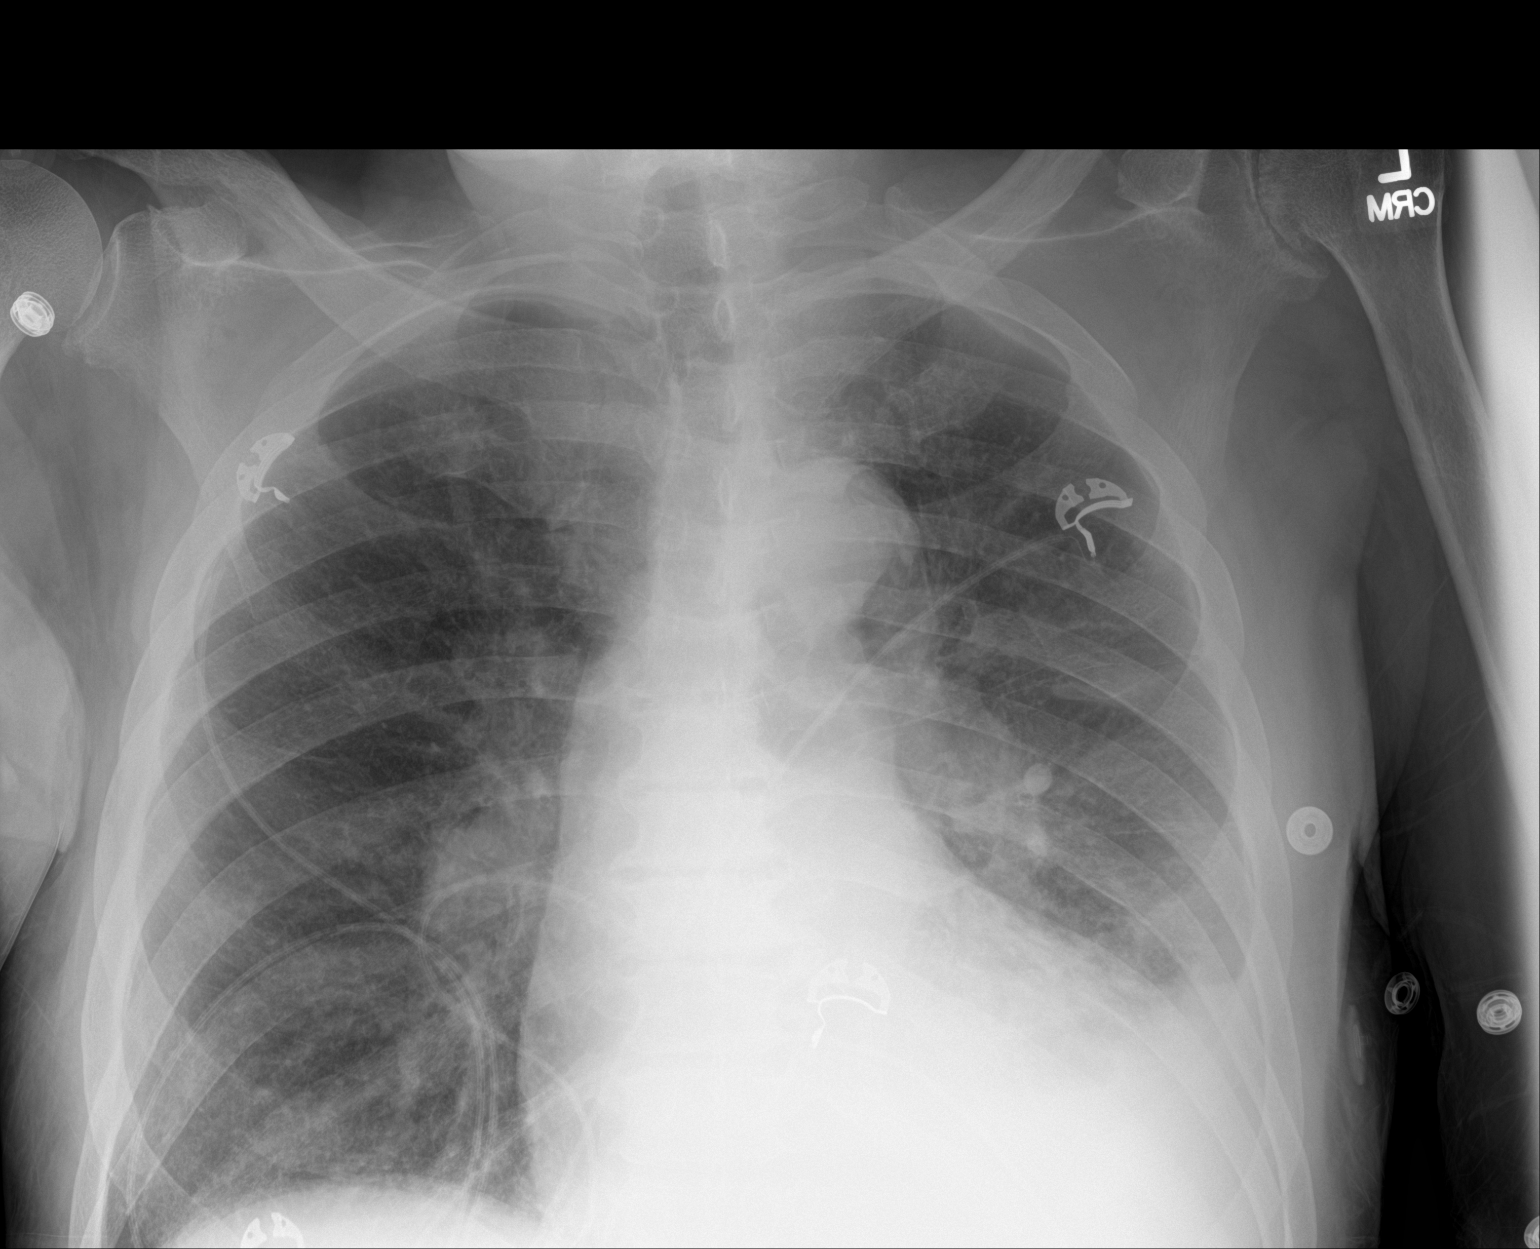

[1 of 1 positions shown; findings below may reference images not displayed]

FINDINGS: Chronic cardiomegaly and aortic tortuosity. Worsened appearance of
the lungs with increased pleural fluid on the left and worsened
volume loss/infiltrate at the left lower lobe and worsened patchy
density or atelectasis at the right lung base. Upper lungs remain
clear.
IMPRESSION: Worsening compared to the study of 1 month ago. Left effusion with
worsened atelectasis/infiltrate in the left lower lobe. Slight
worsened patchy density at the right lung base.

## 2020-01-01 IMAGING — DX DG CHEST 1V PORT
1 series · 1 of 1 positions shown · non-contrast
Comparison: Chest radiograph 07/04/2018. Additional prior chest
radiographs reviewed.

CLINICAL DATA: Cough.

EXAM:
PORTABLE CHEST 1 VIEW

[chest ap]
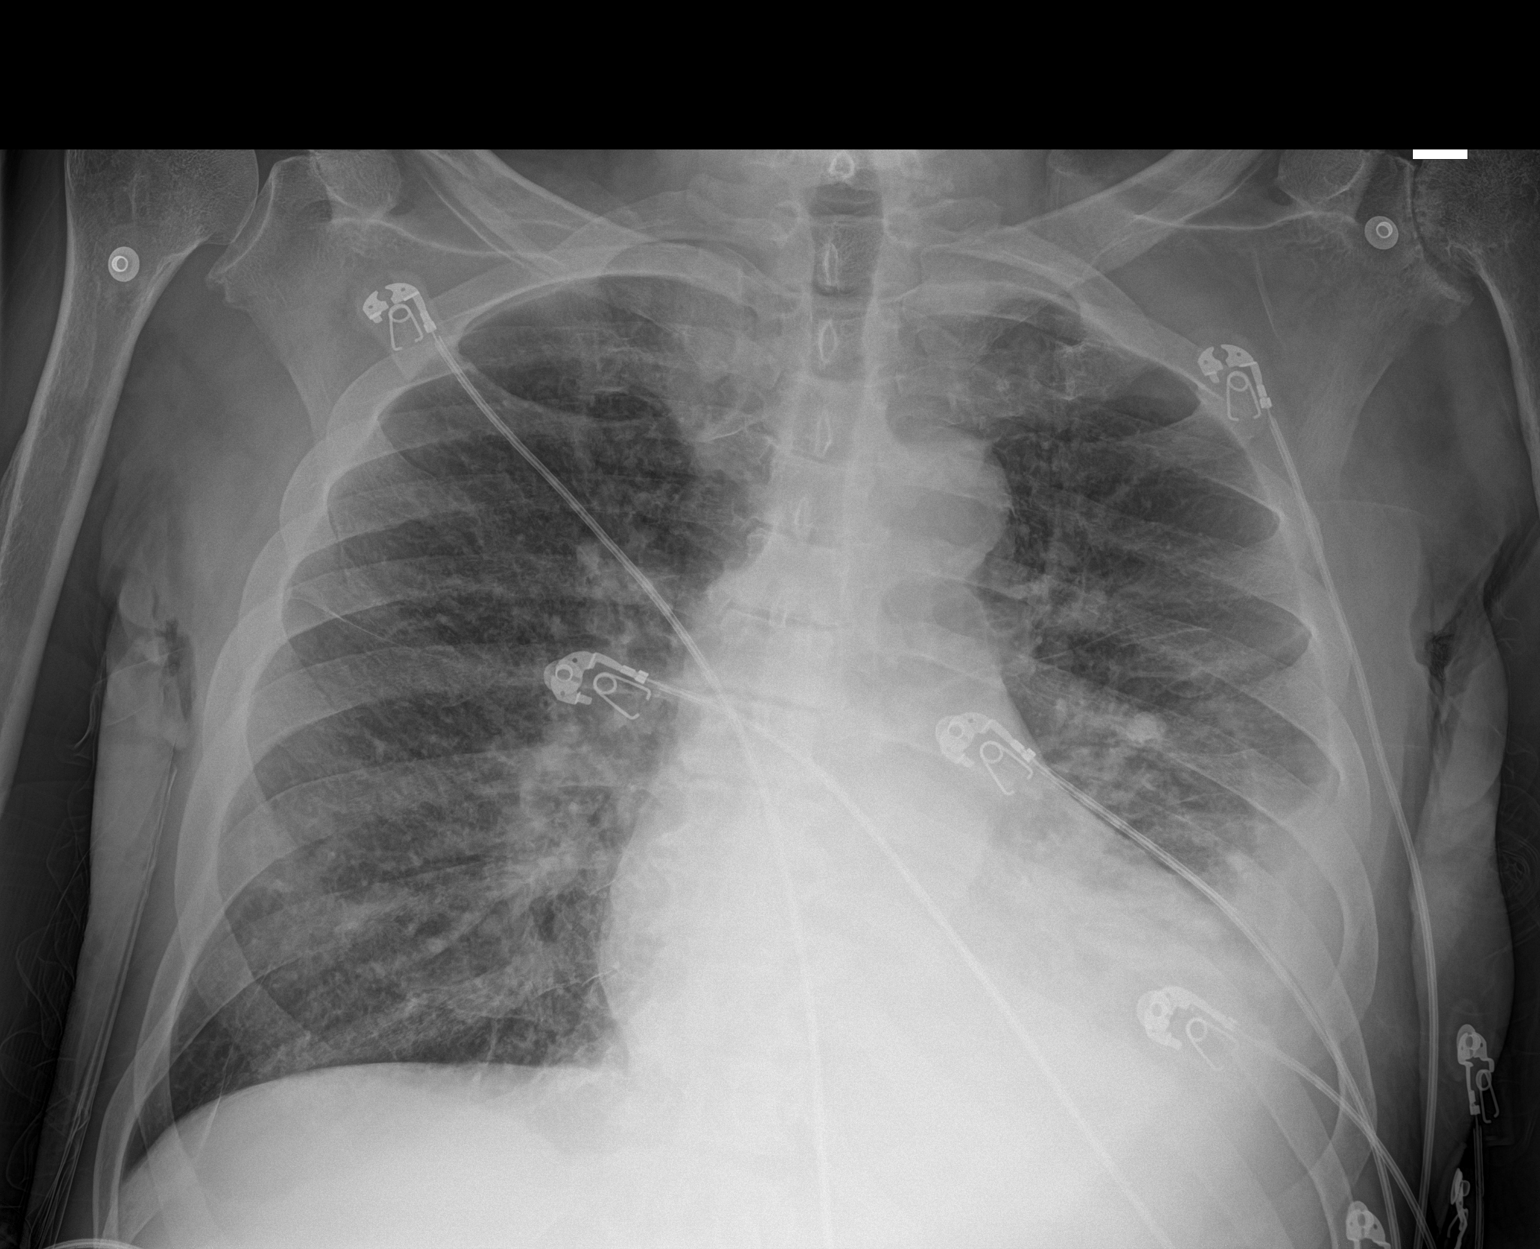

[1 of 1 positions shown; findings below may reference images not displayed]

FINDINGS: Left pleural effusion and basilar opacity is unchanged from recent
exam. Progressive peribronchial thickening. No new airspace disease.
Unchanged heart size and mediastinal contours. No pneumothorax.
IMPRESSION: 1. Increased peribronchial thickening most suggestive of progressive
pulmonary edema, atypical infection felt less likely.
2. Unchanged left basilar opacity and pleural effusion from recent
exam.

## 2020-01-01 IMAGING — CT CT HEAD W/O CM
4 series · 15 of 47 positions shown, 17 images · non-contrast
Comparison: Head CT 06/04/2018

CLINICAL DATA: Altered level of consciousness (LOC), unexplained.
Seizure.

EXAM:
CT HEAD WITHOUT CONTRAST
TECHNIQUE: Contiguous axial images were obtained from the base of the skull
through the vertex without intravenous contrast.

[Series 3: head wo · axial · 0.56mm/px · z∈[-55,+70]mm · 7 of 35 slices shown, 9 images]
[im 5/35  brain]
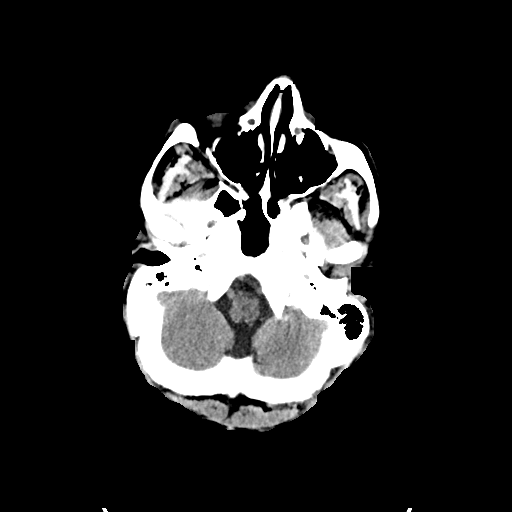
[im 5/35  bone]
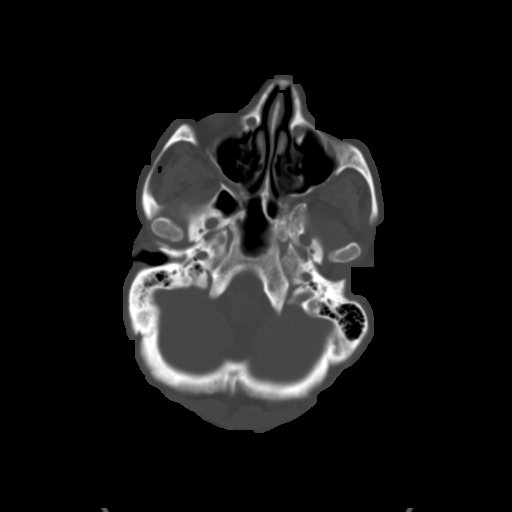
[im 9/35  brain]
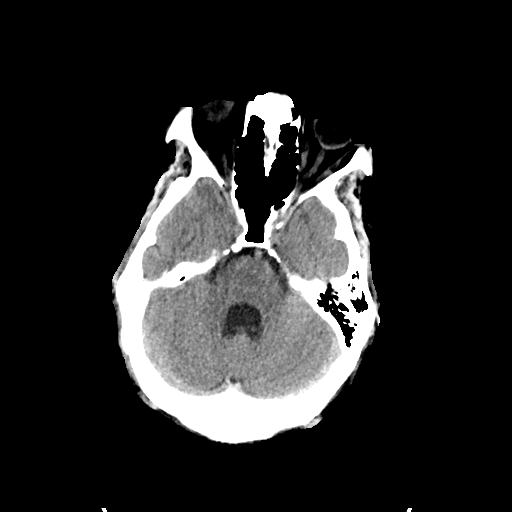
[im 13/35  brain]
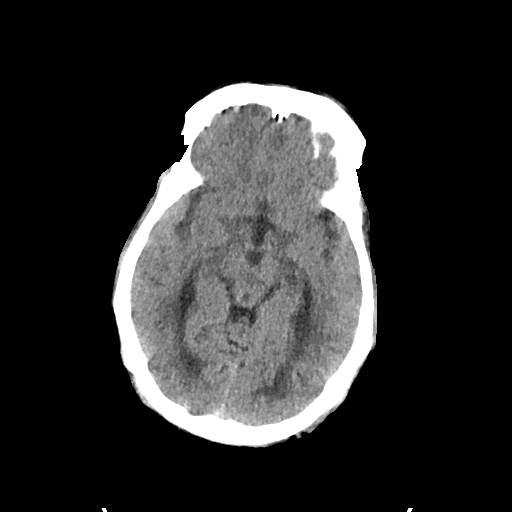
[im 18/35  brain]
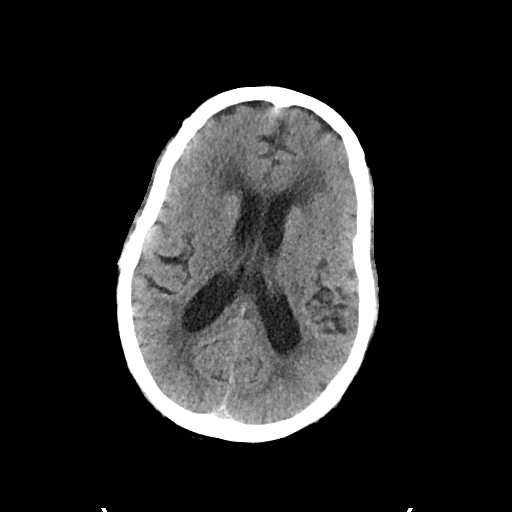
[im 22/35  brain]
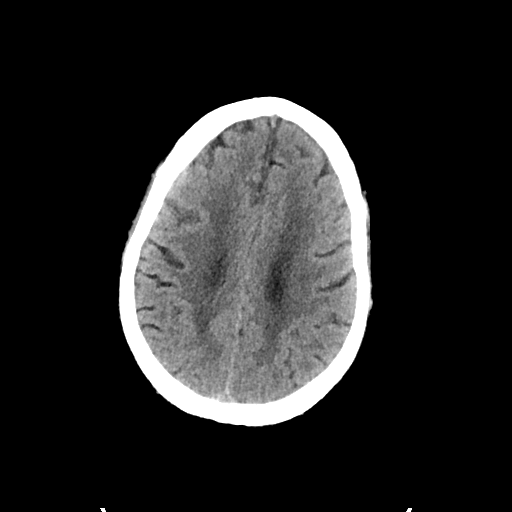
[im 22/35  bone]
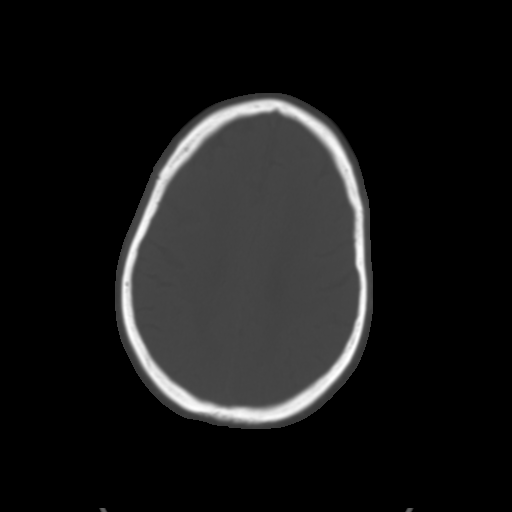
[im 26/35  brain]
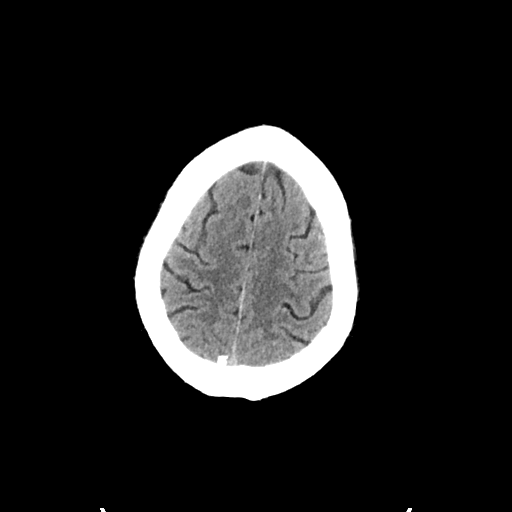
[im 30/35  brain]
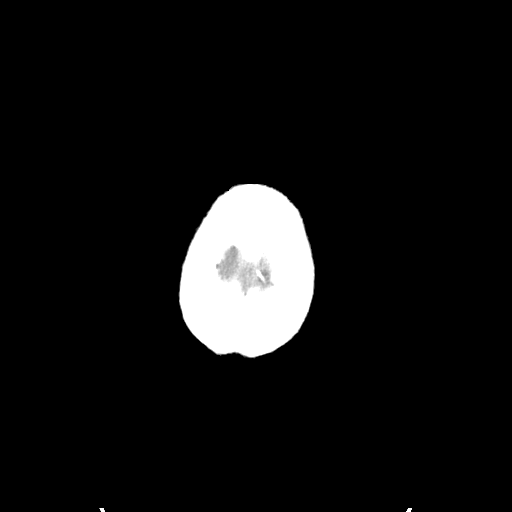

[Series 4: head bone · axial · 0.56mm/px · z∈[-59,-41]mm · 2 of 87 slices shown]
[im 9/87  bone]
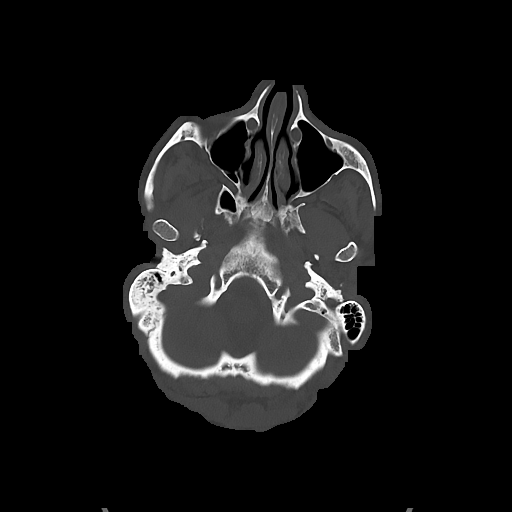
[im 18/87  bone]
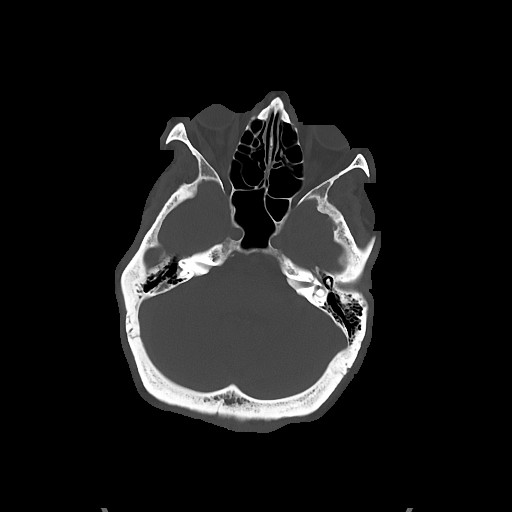

[Series 5: cor soft · coronal · 0.34mm/px · 3 of 70 slices shown]
[im 24/70  brain]
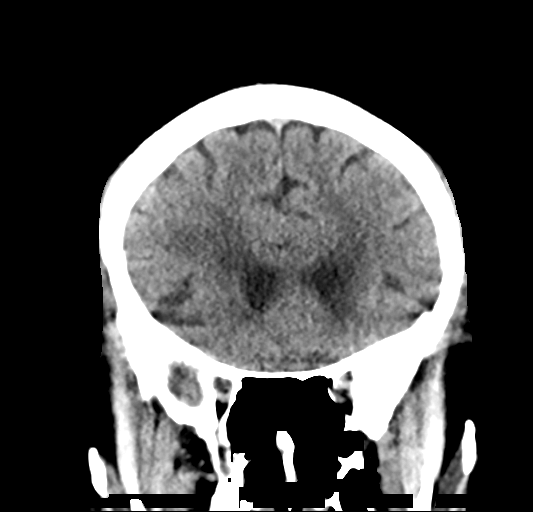
[im 31/70  brain]
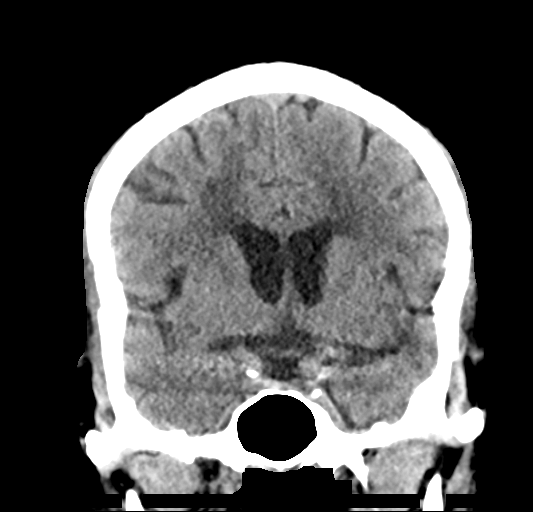
[im 39/70  brain]
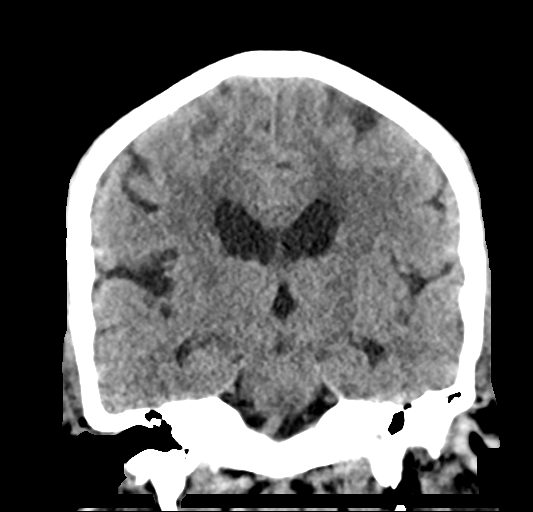

[Series 6: sag soft · sagittal · 0.34mm/px · 3 of 67 slices shown]
[im 23/67  brain]
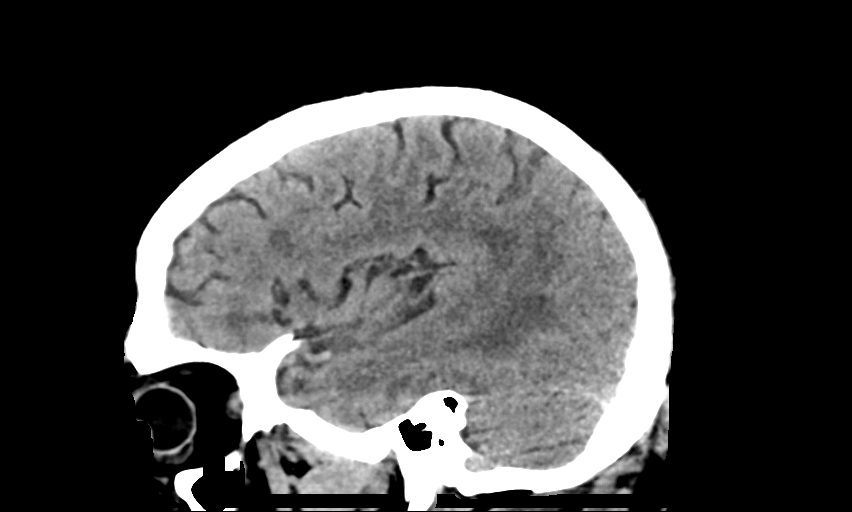
[im 34/67  brain]
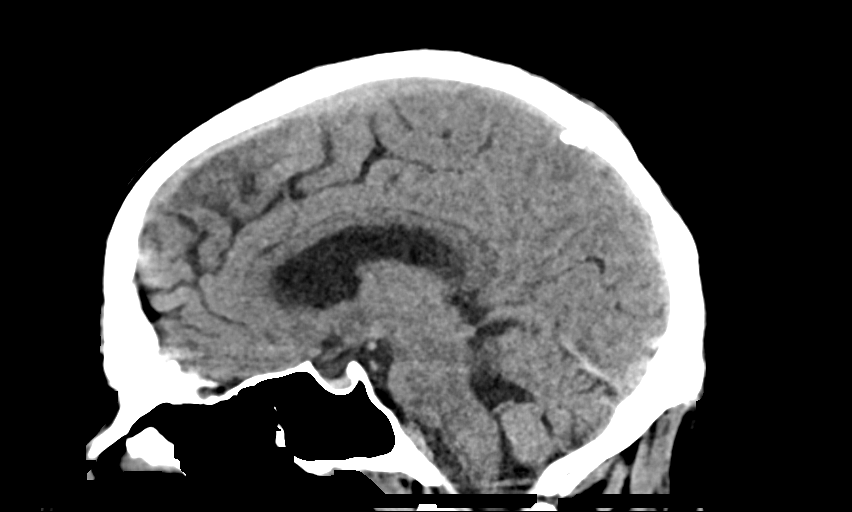
[im 45/67  brain]
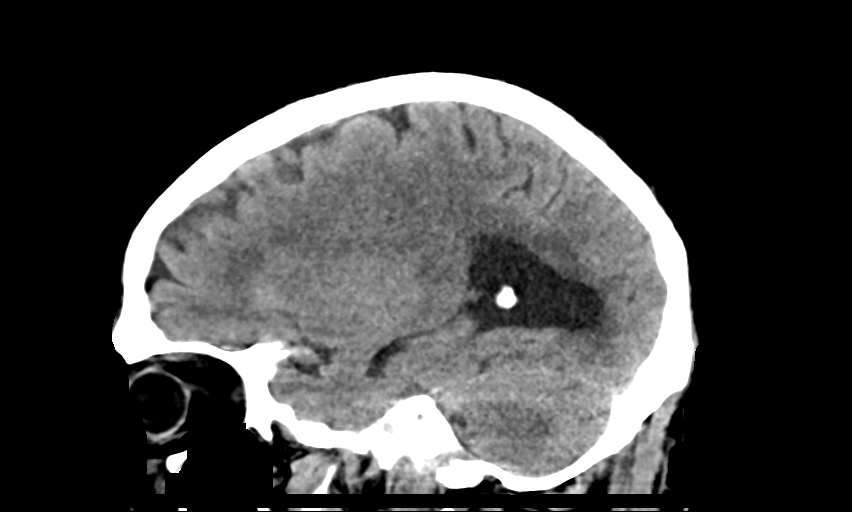

[15 of 47 positions shown; findings below may reference images not displayed]

FINDINGS: Brain: Unchanged degree of atrophy and moderate to advanced chronic
small vessel ischemia from prior exam. No intracranial hemorrhage,
mass effect, or midline shift. No hydrocephalus. The basilar
cisterns are patent. No evidence of territorial infarct or acute
ischemia. No extra-axial or intracranial fluid collection.

Vascular: No hyperdense vessel.

Skull: No fracture or focal lesion.

Sinuses/Orbits: Unchanged mild opacification of right mastoid air
cells. No acute findings. Prior left cataract resection.

Other: None.
IMPRESSION: 1.  No acute intracranial abnormality.
2. Unchanged atrophy and chronic small vessel ischemia.

## 2020-01-02 IMAGING — DX DG CHEST 1V PORT
1 series · 1 of 1 positions shown · non-contrast
Comparison: Earlier this day at 2442 hour

CLINICAL DATA: Endotracheal tube adjustment.

EXAM:
PORTABLE CHEST 1 VIEW

[chest ap]
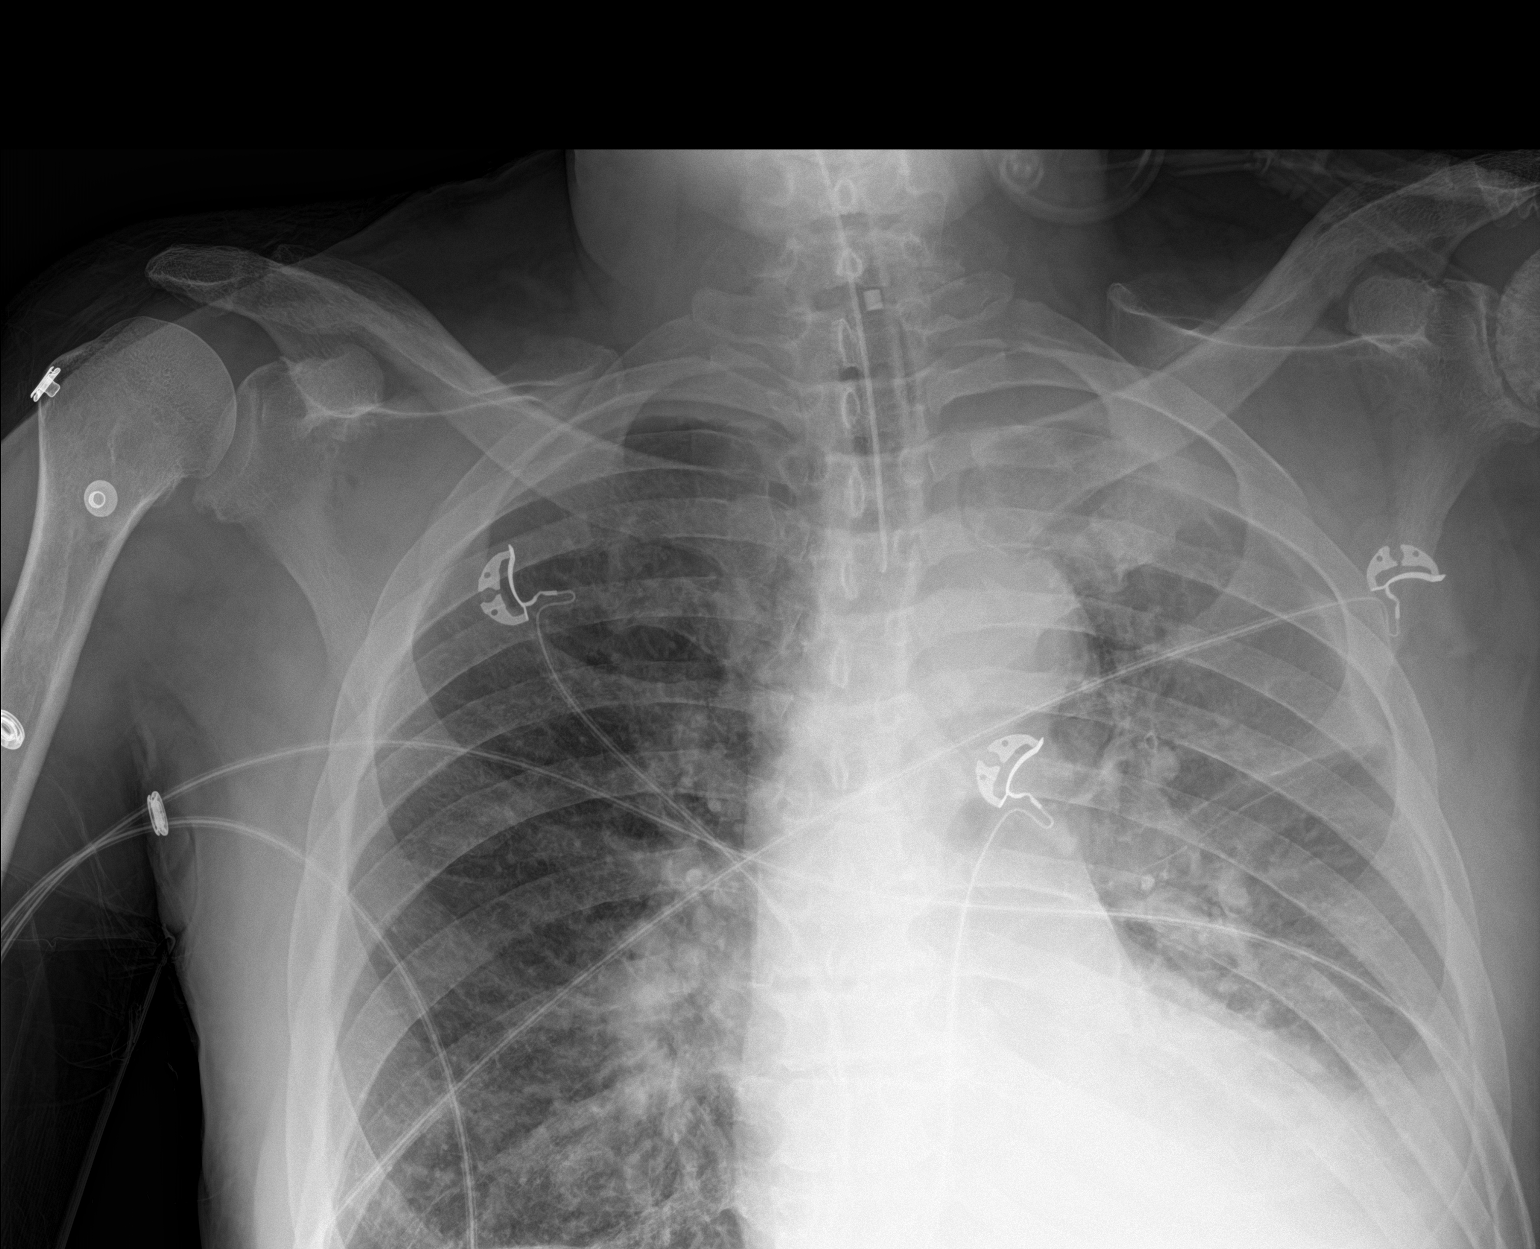

[1 of 1 positions shown; findings below may reference images not displayed]

FINDINGS: The endotracheal tube tip is at the thoracic inlet 5.4 cm from the
carina. Lower thorax/lung bases are not entirely included in the
field of view. No significant change in left lung base opacity and
pleural effusion. Unchanged cardiomediastinal contours. No
pneumothorax.
IMPRESSION: 1. Endotracheal tube tip at the thoracic inlet 5.4 cm from the
carina.
2. Exam is otherwise unchanged from radiograph 12 minutes prior,
allowing for lung bases not included in the field of view.

## 2020-01-02 IMAGING — CT CT HEAD W/O CM
4 series · 17 of 47 positions shown, 19 images · non-contrast
Comparison: 07/09/2018 head CT.

CLINICAL DATA: Seizure.  HIV.  Inpatient.

EXAM:
CT HEAD WITHOUT CONTRAST
TECHNIQUE: Contiguous axial images were obtained from the base of the skull
through the vertex without intravenous contrast.

[Series 3: head without · axial · non-contrast · 0.48mm/px · z∈[-180,-55]mm · 7 of 35 slices shown, 9 images]
[im 5/35  brain]
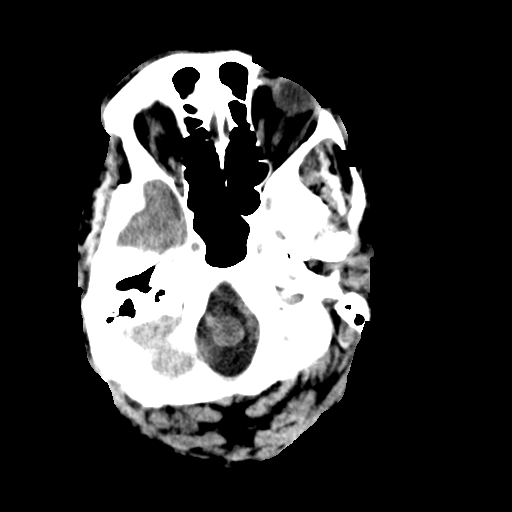
[im 5/35  bone]
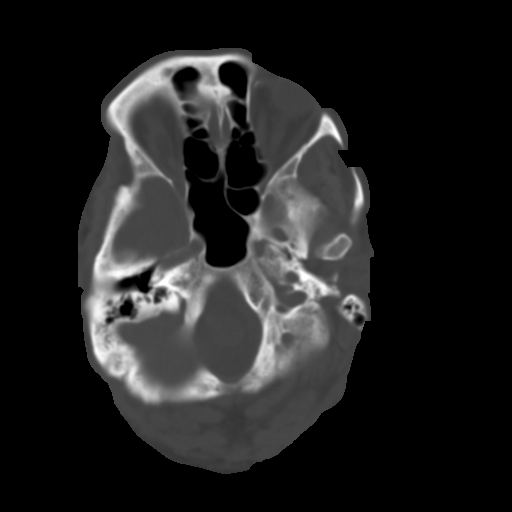
[im 9/35  brain]
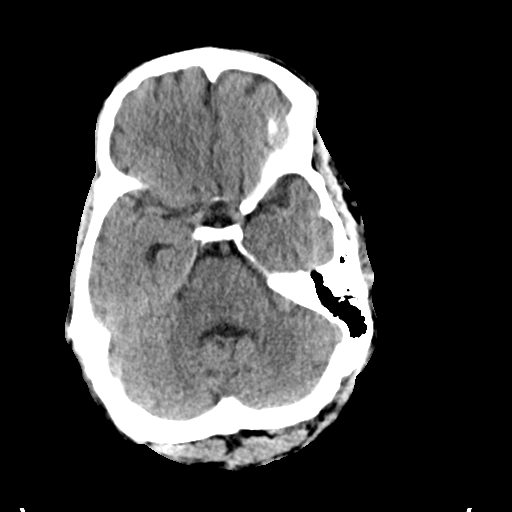
[im 13/35  brain]
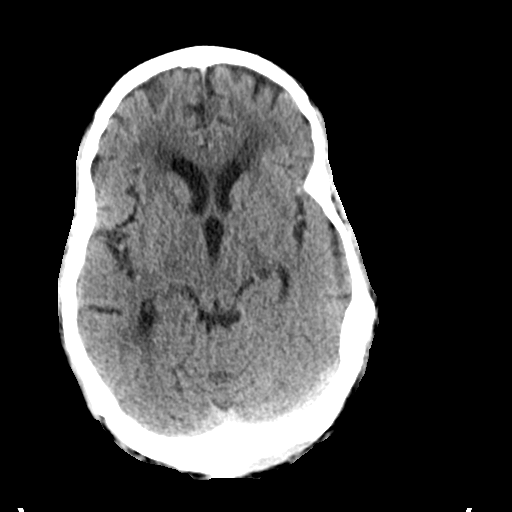
[im 18/35  brain]
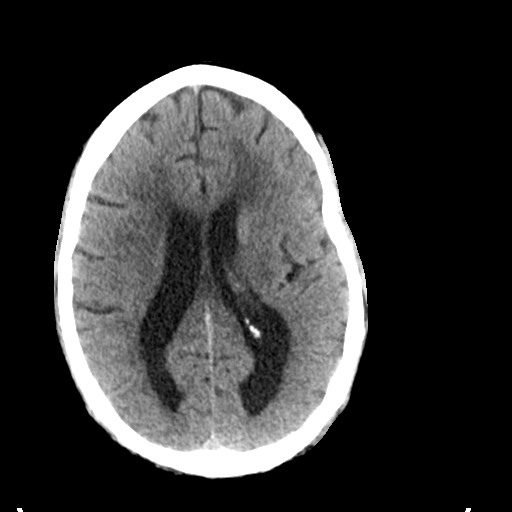
[im 22/35  brain]
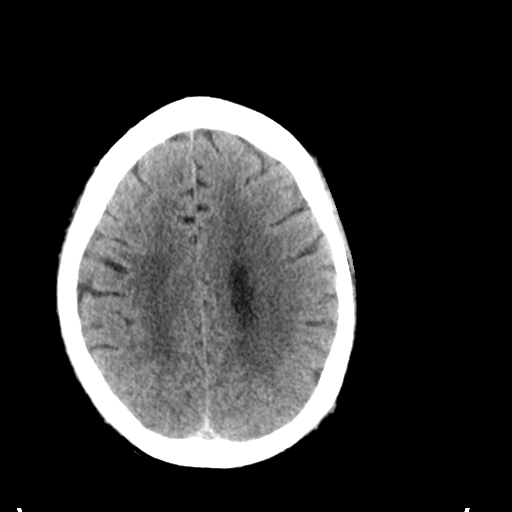
[im 22/35  bone]
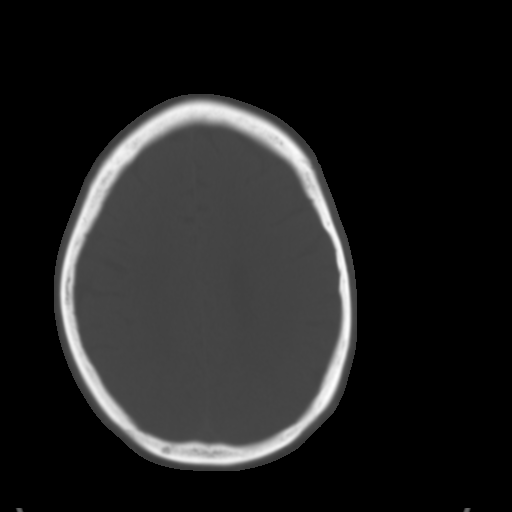
[im 26/35  brain]
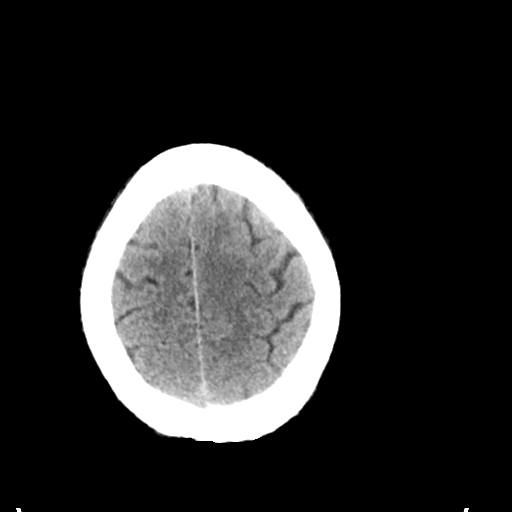
[im 30/35  brain]
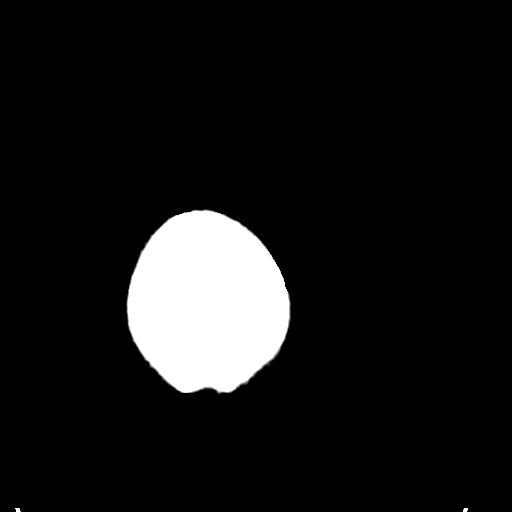

[Series 4: head bone · axial · 0.48mm/px · z∈[-184,-124]mm · 4 of 87 slices shown]
[im 9/87  bone]
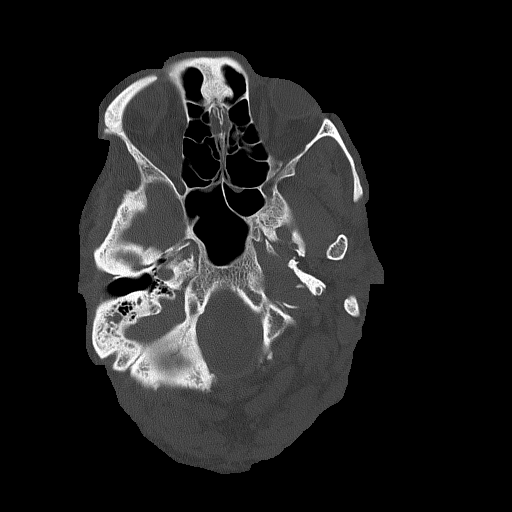
[im 18/87  bone]
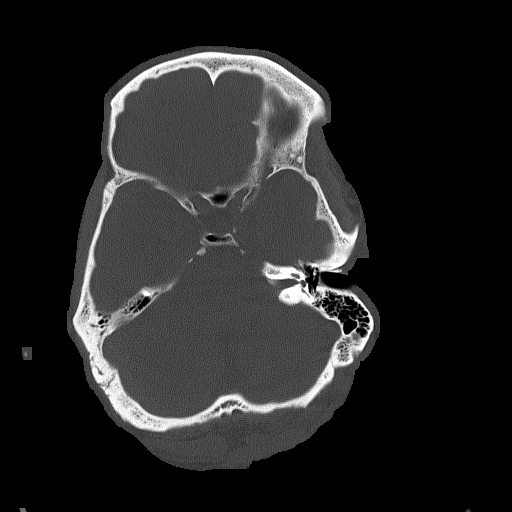
[im 26/87  bone]
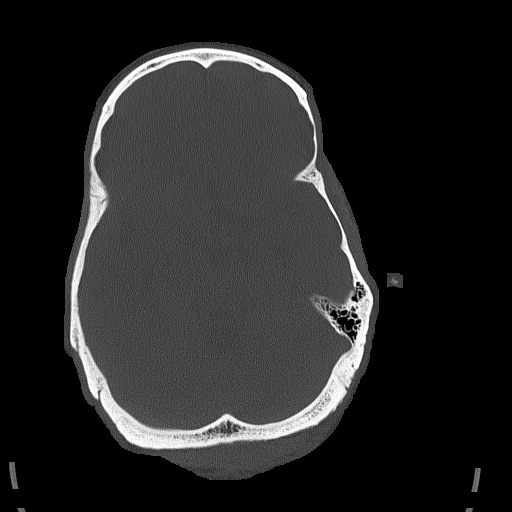
[im 39/87  bone]
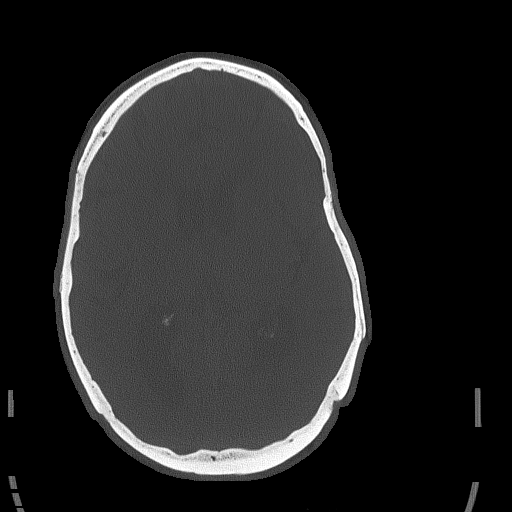

[Series 5: head without cor · coronal · non-contrast · 0.33mm/px · 3 of 76 slices shown]
[im 26/76  brain]
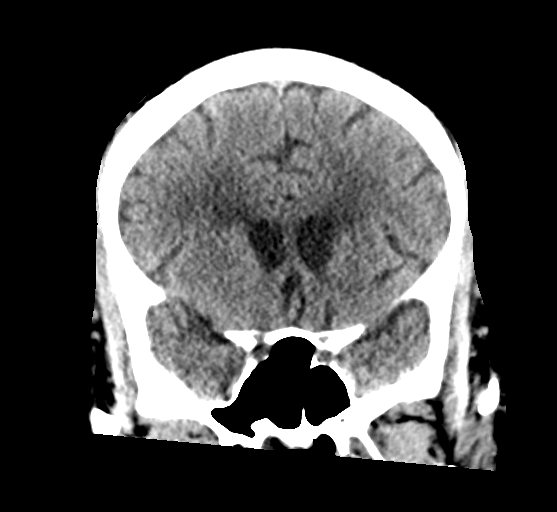
[im 34/76  brain]
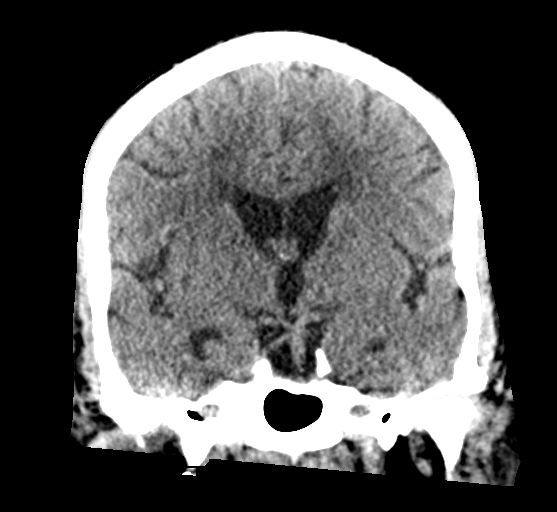
[im 42/76  brain]
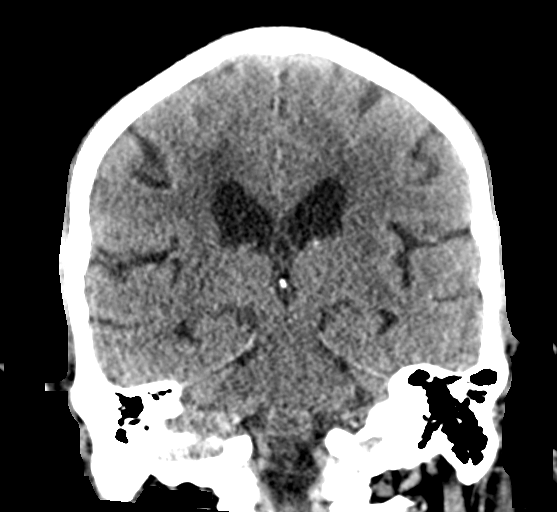

[Series 6: head without sag · sagittal · non-contrast · 0.40mm/px · 3 of 59 slices shown]
[im 21/59  brain]
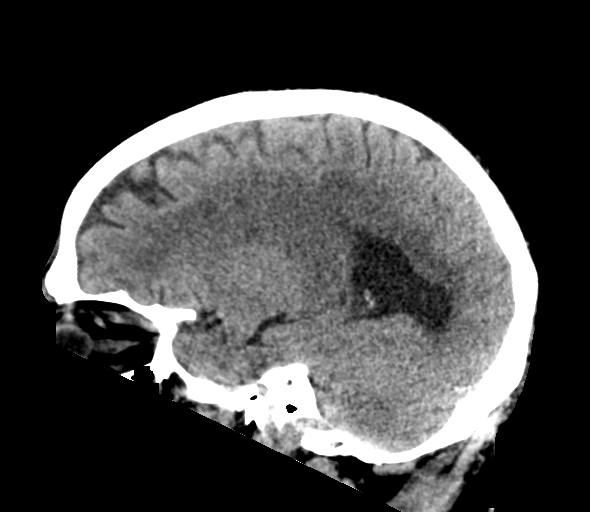
[im 30/59  brain]
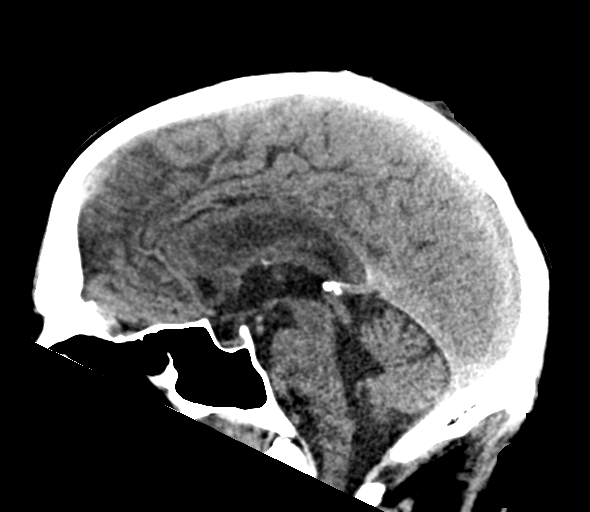
[im 39/59  brain]
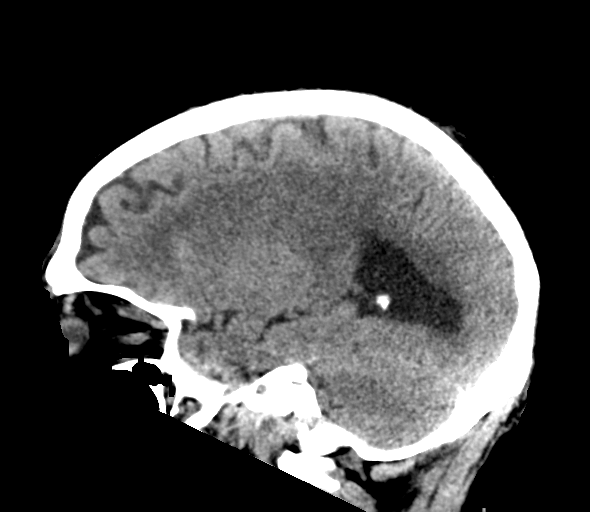

[17 of 47 positions shown; findings below may reference images not displayed]

FINDINGS: Brain: No evidence of parenchymal hemorrhage or extra-axial fluid
collection. No mass lesion, mass effect, or midline shift. No CT
evidence of acute infarction. Nonspecific moderate subcortical and
periventricular white matter hypodensity, most in keeping with
chronic small vessel ischemic change. Generalized cerebral volume
loss. Cerebral ventricle sizes are stable and concordant with the
degree of cerebral volume loss.

Vascular: No acute abnormality.

Skull: No evidence of calvarial fracture.

Sinuses/Orbits: The visualized paranasal sinuses are essentially
clear.

Other: Partial right mastoid effusion. Clear left mastoid air cells.
IMPRESSION: 1. No evidence of acute intracranial abnormality. No evidence of
calvarial fracture.
2. Generalized cerebral volume loss and moderate chronic small
vessel ischemic changes in the cerebral white matter.
3. Partial right mastoid effusion.

## 2020-01-02 IMAGING — DX DG CHEST 1V PORT
1 series · 1 of 1 positions shown · non-contrast
Comparison: 07/10/2018

CLINICAL DATA: Central line placement

EXAM:
PORTABLE CHEST 1 VIEW

[chest ap]
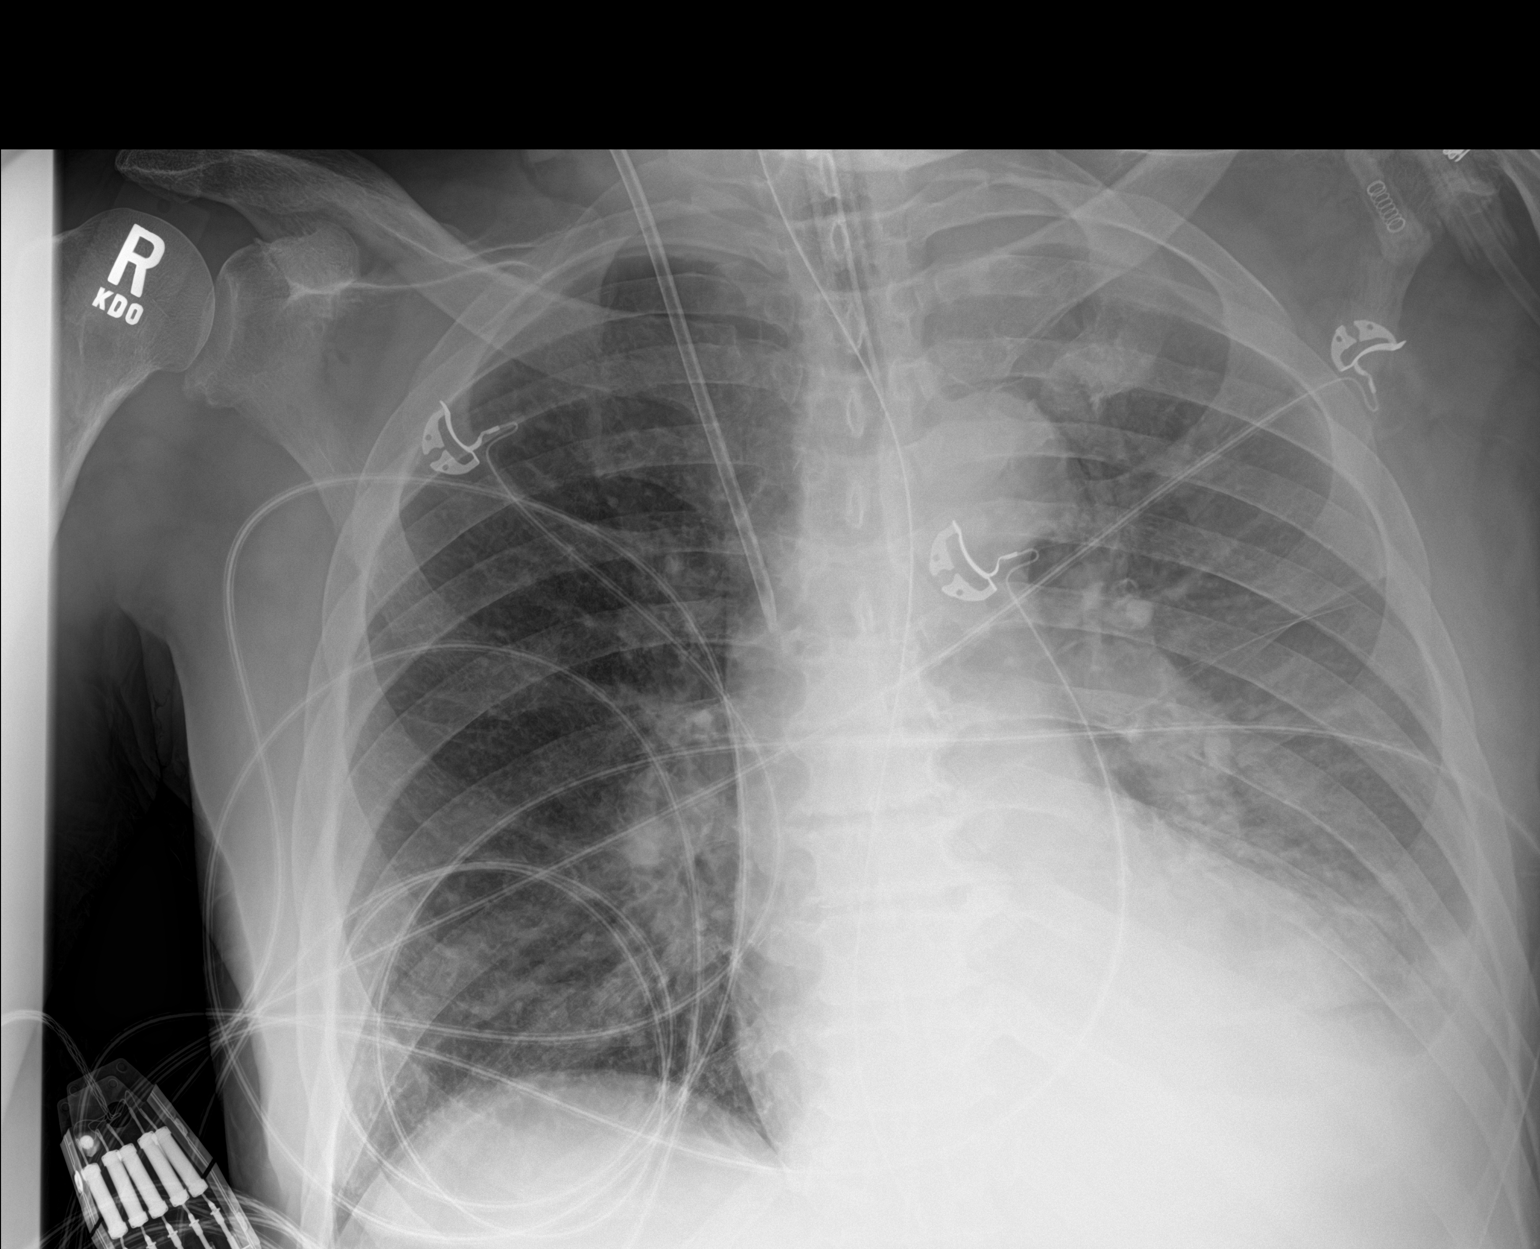

[1 of 1 positions shown; findings below may reference images not displayed]

FINDINGS: Interval placement of right internal jugular central line. The tip
is in the SVC. NG tube enters the stomach. Endotracheal tube is
stable.

Moderate to large left effusion. Left lung airspace disease, most
confluent in the left base, stable.

No pneumothorax.
IMPRESSION: Interval placement of right central line with the tip in the SVC. No
pneumothorax. NG tube is in place which enters the stomach.
Otherwise no change.

## 2020-01-02 IMAGING — DX DG CHEST 1V PORT
1 series · 1 of 1 positions shown · non-contrast
Comparison: Radiograph July 09, 2018.

CLINICAL DATA: Seizure.

EXAM:
PORTABLE CHEST 1 VIEW

[chest ap]
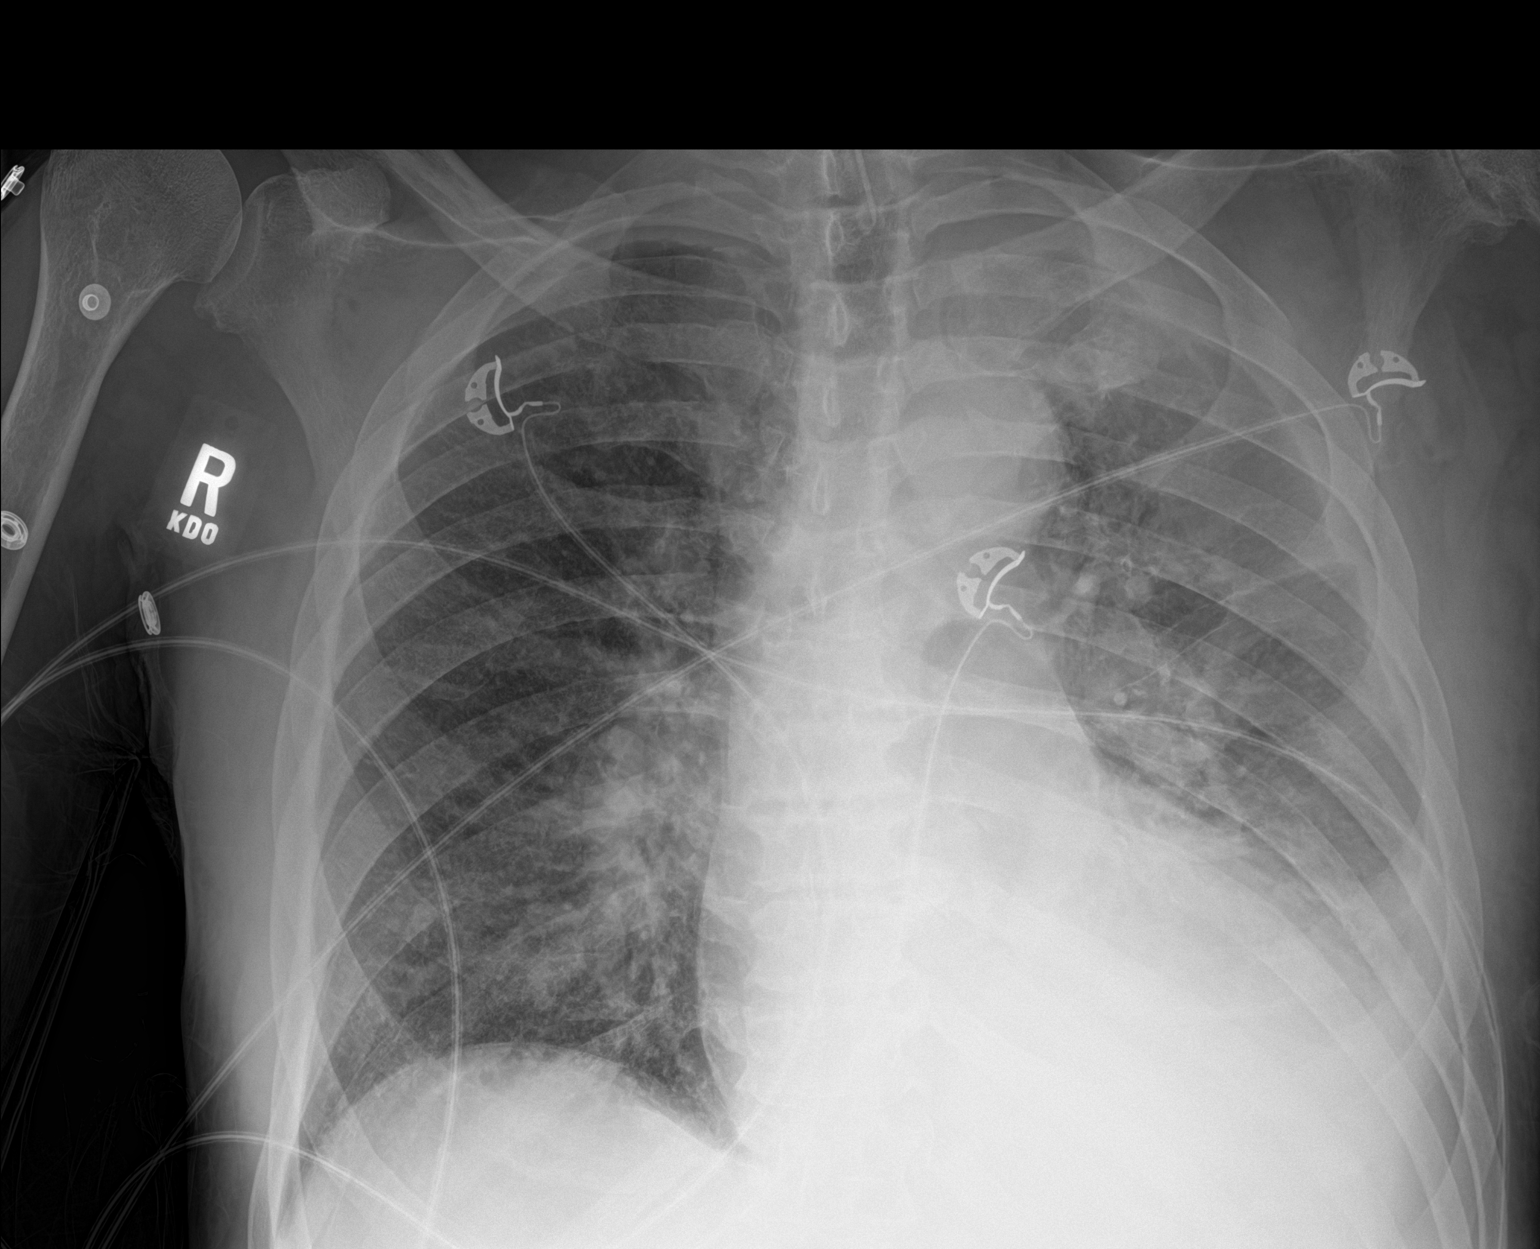

[1 of 1 positions shown; findings below may reference images not displayed]

FINDINGS: Stable cardiomediastinal silhouette. Endotracheal tube is seen
projected over tracheal air shadow with distal tip 10 cm above the
carina. No pneumothorax is noted. Left apical fluid is noted. Stable
left basilar atelectasis or infiltrate is noted with associated
pleural effusion. Mild right basilar subsegmental atelectasis is
noted. Bony thorax is unremarkable.
IMPRESSION: Stable left basilar atelectasis or infiltrate is noted with
associated pleural effusion.

Endotracheal tube appears to be projected over the trachea, but
advancement by 3-4 cm is recommended.

## 2020-01-04 IMAGING — DX DG CHEST 1V PORT
1 series · 1 of 1 positions shown · non-contrast
Comparison: Radiograph July 10, 2018.

CLINICAL DATA: Acute respiratory failure with hypoxemia.

EXAM:
PORTABLE CHEST 1 VIEW

[chest]
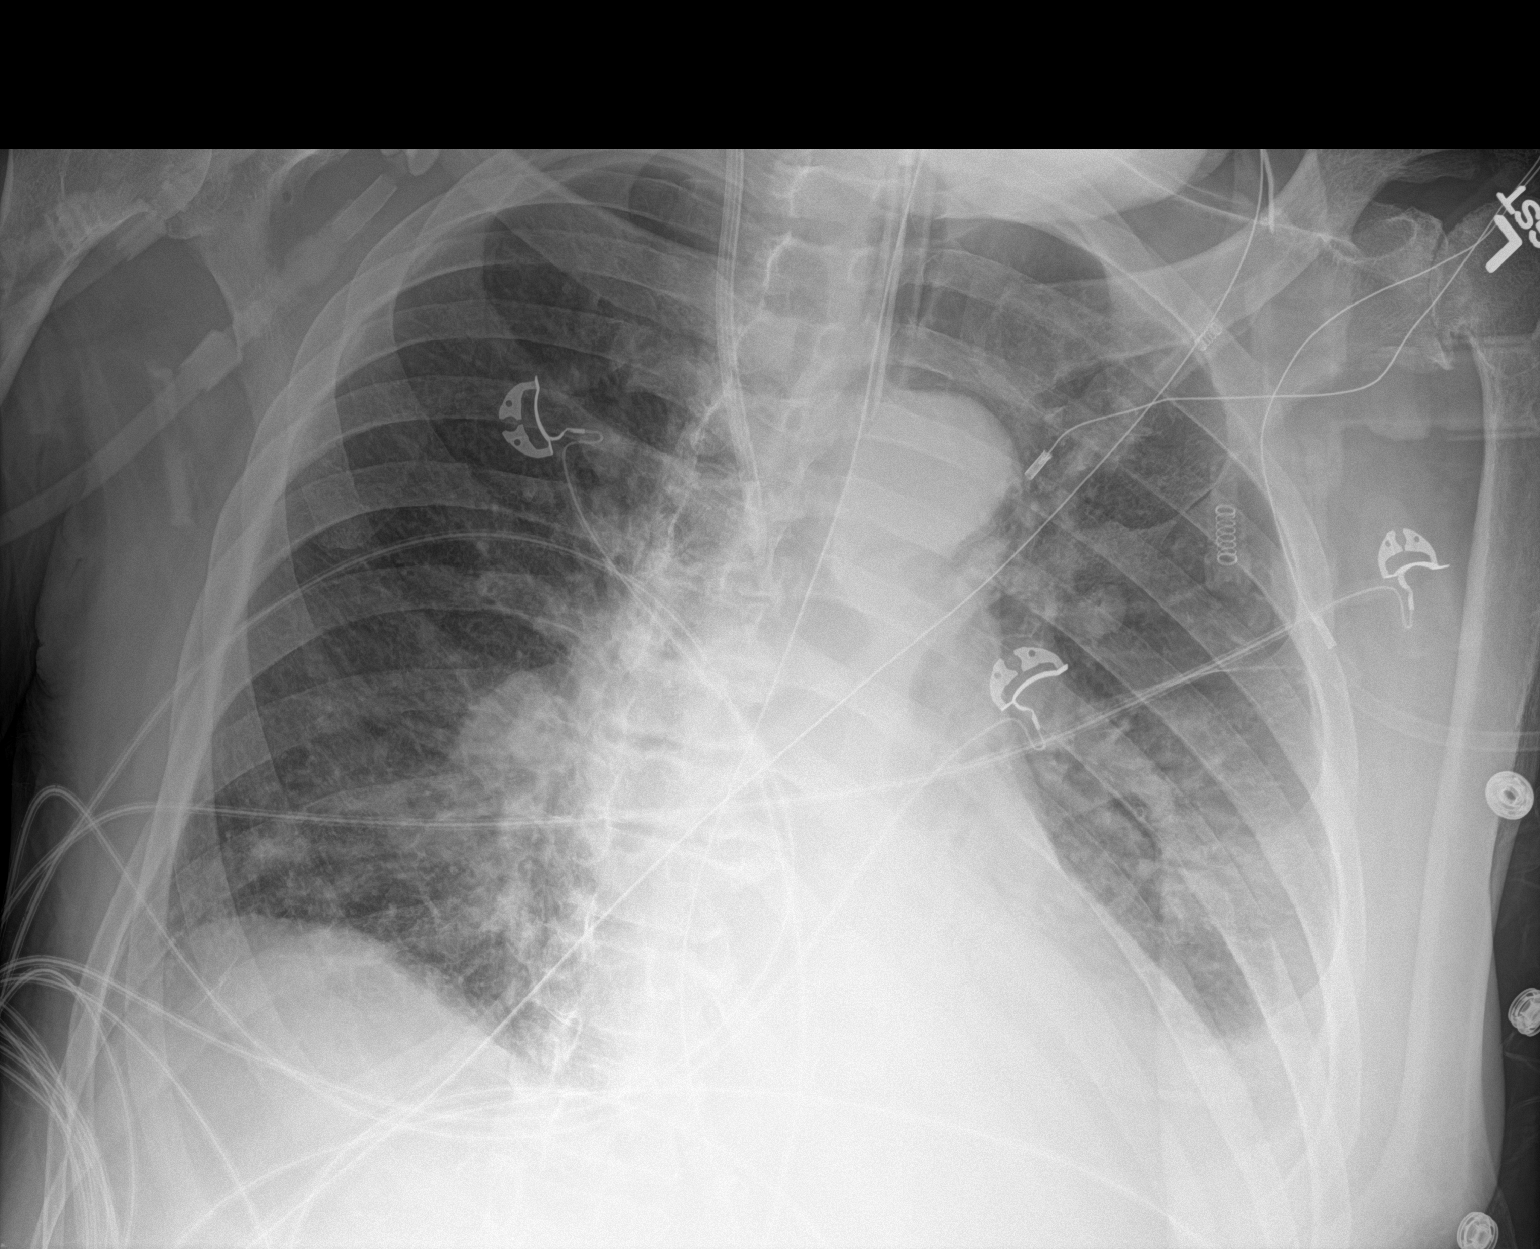

[1 of 1 positions shown; findings below may reference images not displayed]

FINDINGS: Stable cardiomediastinal silhouette. Endotracheal nasogastric tubes
are unchanged in position. Right internal jugular catheter is
unchanged in position. No pneumothorax is noted. Minimal right
basilar subsegmental atelectasis is noted. Mild left basilar
atelectasis or infiltrate is noted with associated pleural effusion.
Severe degenerative changes seen involving the left glenohumeral
joint.
IMPRESSION: Stable support apparatus. Mild left basilar atelectasis or
infiltrate is noted with associated pleural effusion.

## 2020-01-04 IMAGING — MR MR HEAD W/O CM
11 of 13 series · 37 of 48 positions shown · non-contrast
Comparison: Head CT 07/10/2018 and earlier.  Brain MRI 04/11/2018.

CLINICAL DATA: 62-year-old male with HIV and in stage renal disease
on dialysis status post seizures.

EXAM:
MRI HEAD WITHOUT CONTRAST
TECHNIQUE: Multiplanar, multiecho pulse sequences of the brain and surrounding
structures were obtained without intravenous contrast.

[Series 5: DWI · axial · 4.0mm · 0.92mm/px · z∈[-69,+82]mm · 8 of 78 slices shown (1 of 4)]
[im 1/78]
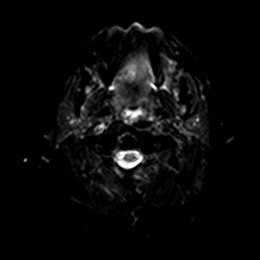
[im 12/78]
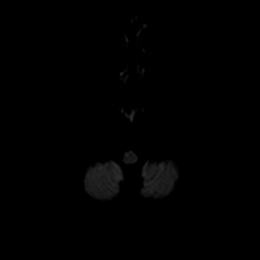
[im 23/78]
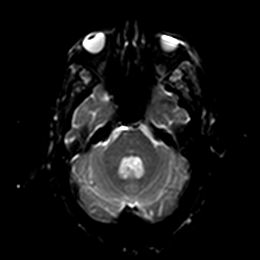
[im 34/78]
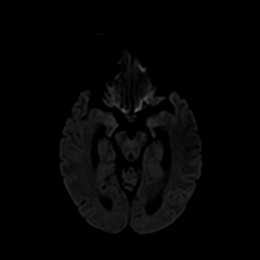
[im 45/78]
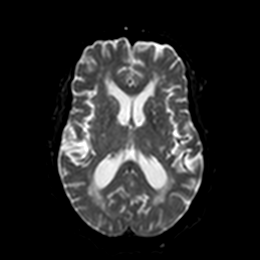
[im 56/78]
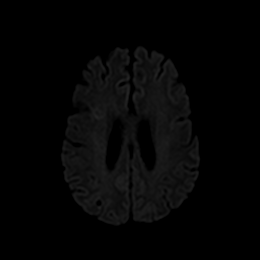
[im 67/78]
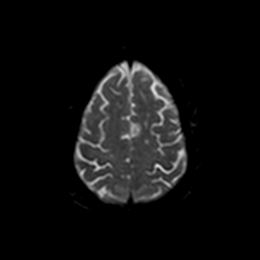
[im 78/78]
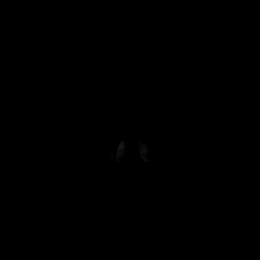

[Series 6: DWI · axial · 4.0mm · 0.92mm/px · z∈[-69,+82]mm · 4 of 39 slices shown (2 of 4)]
[im 1/39]
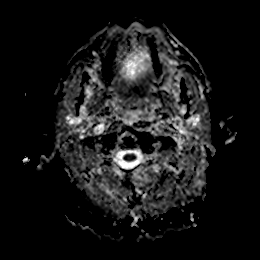
[im 13/39]
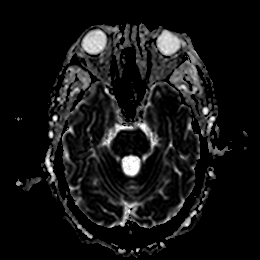
[im 26/39]
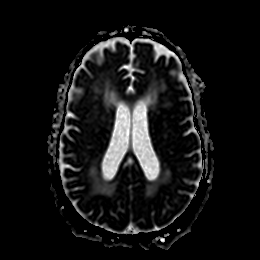
[im 39/39]
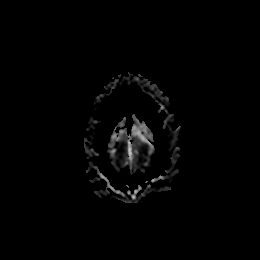

[Series 7: DWI · coronal · 4.0mm · 0.88mm/px · 6 of 72 slices shown (3 of 4)]
[im 1/72]
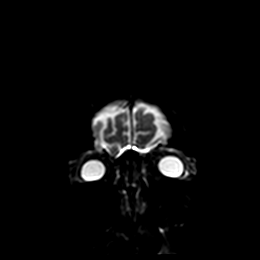
[im 15/72]
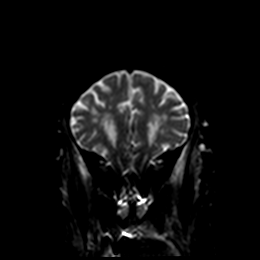
[im 29/72]
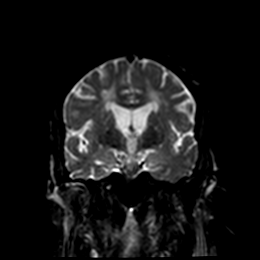
[im 43/72]
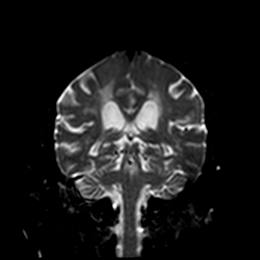
[im 57/72]
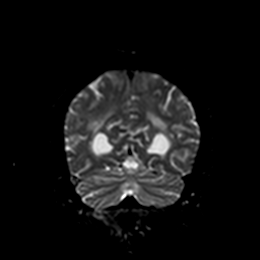
[im 72/72]
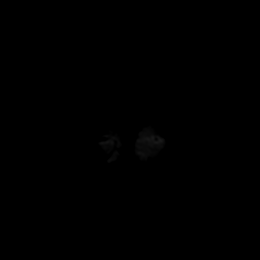

[Series 8: DWI · coronal · 4.0mm · 0.88mm/px · 3 of 36 slices shown (4 of 4)]
[im 1/36]
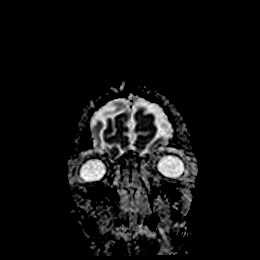
[im 18/36]
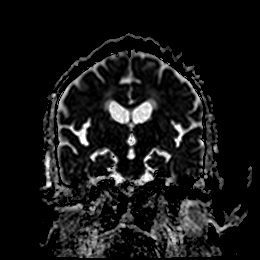
[im 36/36]
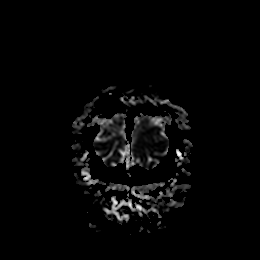

[Series 9: T2 · axial · 5.0mm · 0.75mm/px · z∈[-68,+86]mm · 2 of 27 slices shown (1 of 3)]
[im 1/27]
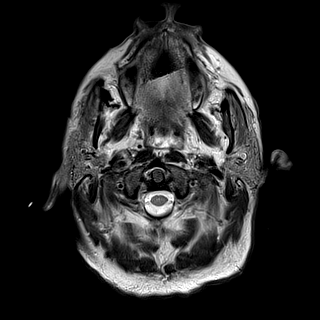
[im 27/27]
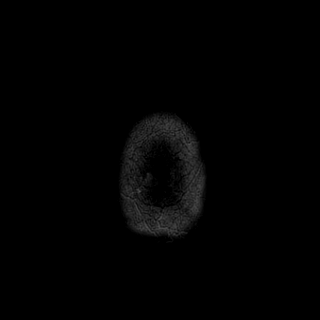

[Series 10: T1 · sagittal · 5.0mm · 0.81mm/px · 2 of 23 slices shown]
[im 1/23]
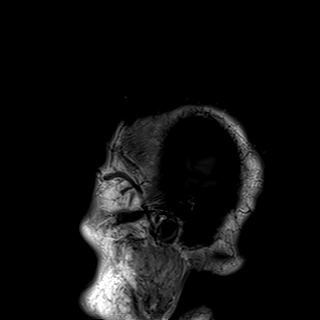
[im 23/23]
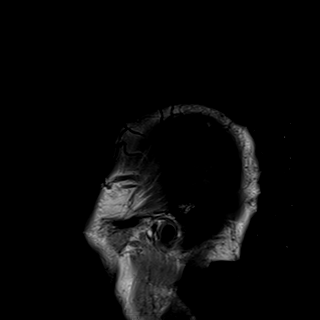

[Series 11: swi_images · axial · 3.0mm · 0.94mm/px · z∈[-69,+5]mm · 3 of 52 slices shown]
[im 1/52]
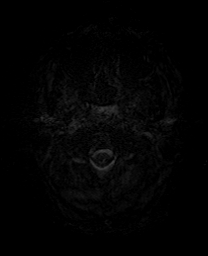
[im 13/52]
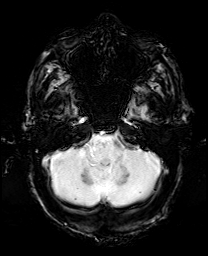
[im 26/52]
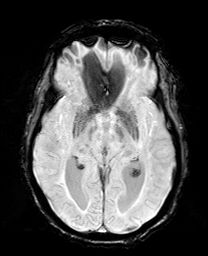

[Series 13: FLAIR · axial · 5.0mm · 0.47mm/px · z∈[-69,+86]mm · 2 of 27 slices shown (1 of 2)]
[im 1/27]
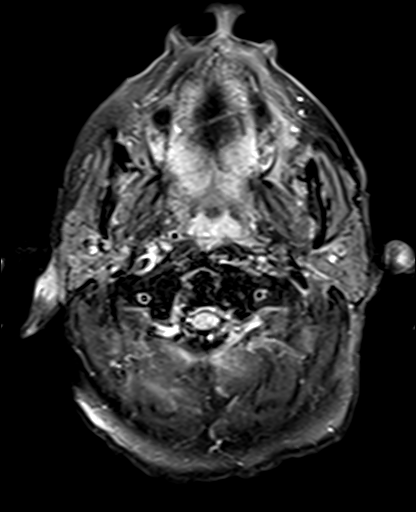
[im 27/27]
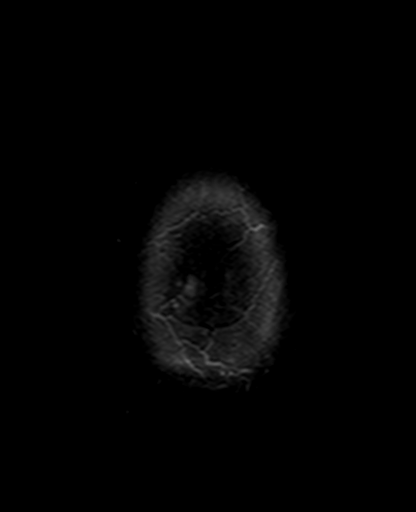

[Series 14: T2 · coronal · 3.0mm · 0.27mm/px · 2 of 28 slices shown (2 of 3)]
[im 1/28]
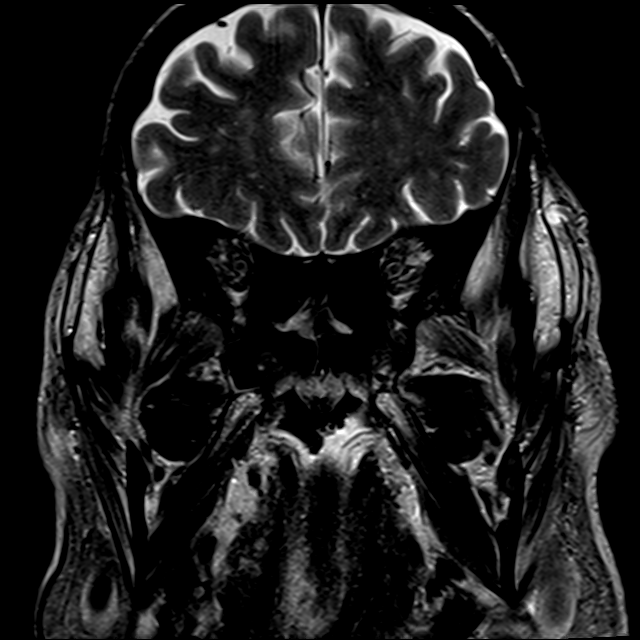
[im 28/28]
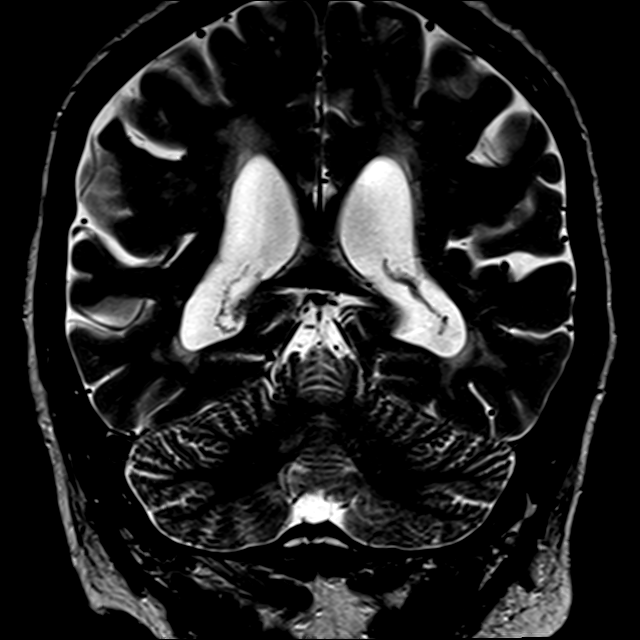

[Series 15: FLAIR · coronal · 3.0mm · 0.53mm/px · 2 of 28 slices shown (2 of 2)]
[im 1/28]
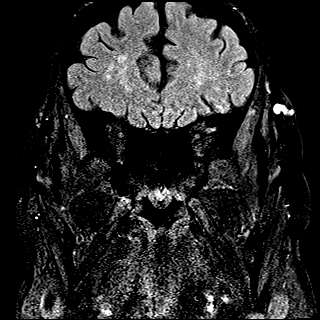
[im 28/28]
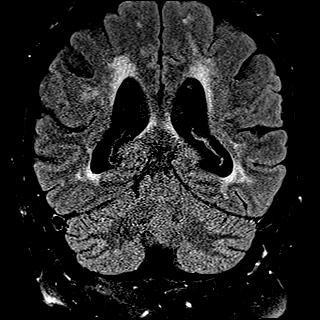

[Series 17: T2 · coronal · 5.0mm · 0.34mm/px · 3 of 31 slices shown (3 of 3)]
[im 1/31]
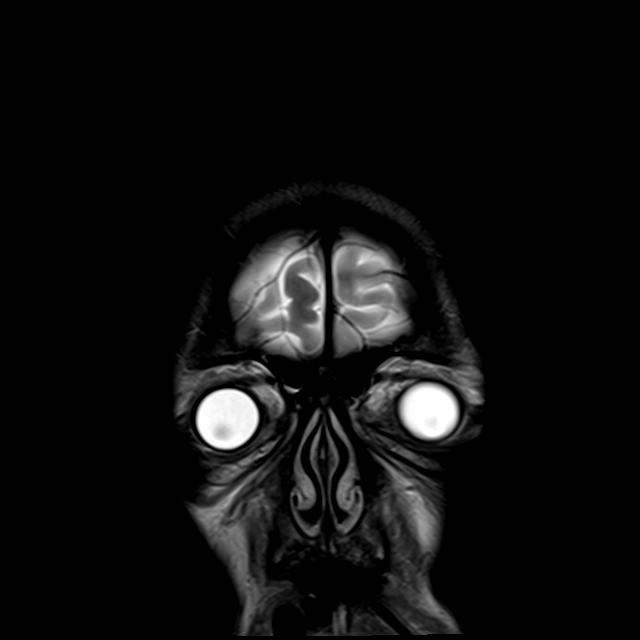
[im 16/31]
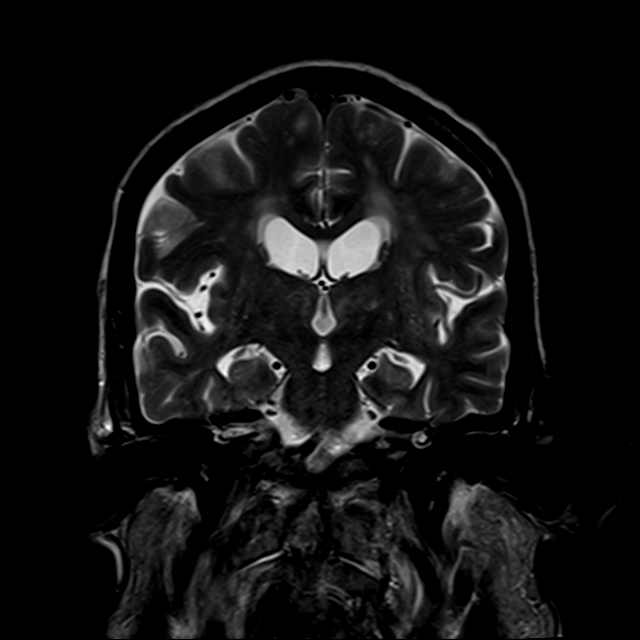
[im 31/31]
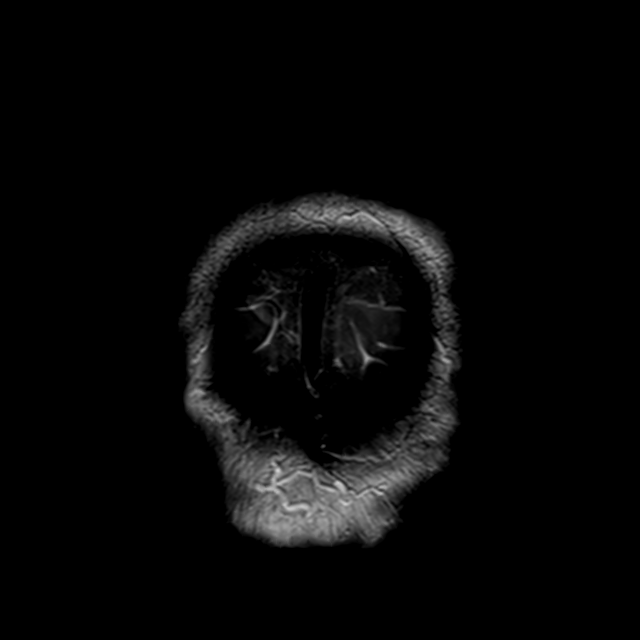

[37 of 48 positions shown; findings below may reference images not displayed]

FINDINGS: Brain: There is a linear 12 millimeter focus of restricted diffusion
in the anterior left frontal lobe subcortical white matter (series
5, image 63). Associated T2 and FLAIR hyperintensity with no
hemorrhage or mass effect. Superimposed advanced chronic cerebral
white matter T2 and FLAIR hyperintensity. There is a small area of
faint ring like trace diffusion abnormality in the anterior right
corona radiata on series 5, image 68 with associated T2 and FLAIR
hyperintensity which is new since [REDACTED].

No other restricted diffusion. No midline shift, mass effect,
evidence of mass lesion, ventriculomegaly, extra-axial collection or
acute intracranial hemorrhage. Cervicomedullary junction and
pituitary are within normal limits.

Stable cerebral volume. Scattered chronic microhemorrhages in the
brain are stable since [REDACTED] except for that in the left temporal
lobe on series 11, image 24. No cortical encephalomalacia
identified. Relatively little signal abnormality in the deep gray
matter nuclei, although a small lacune in the left thalamus on
series 9, image 15 is new since [REDACTED].

Thin slice temporal lobe imaging. The hippocampal formations appear
symmetric and within normal limits.

Vascular: Major intracranial vascular flow voids are stable.

Skull and upper cervical spine: Negative visible cervical spine.
Visualized bone marrow signal is within normal limits.

Sinuses/Orbits: Stable and negative.

Other: Intubated. Small volume fluid in the pharynx. Stable mild
right mastoid effusion.
IMPRESSION: 1. Small acute white matter infarct in the anterior left frontal
lobe. No associated hemorrhage or mass effect.
2. Small late subacute to early chronic white matter lacune in the
anterior right corona radiata. Also, small lacunar infarct in the
left thalamus and microhemorrhage in the left temporal lobe are
chronic but new since [DATE]. Underlying advanced chronic cerebral white matter disease and
scattered chronic microhemorrhages.

## 2020-01-05 IMAGING — DX DG CHEST 1V PORT
1 series · 1 of 1 positions shown · non-contrast
Comparison: 07/12/2018

CLINICAL DATA: Acute respiratory failure with hypoxemia

EXAM:
PORTABLE CHEST 1 VIEW

[chest]
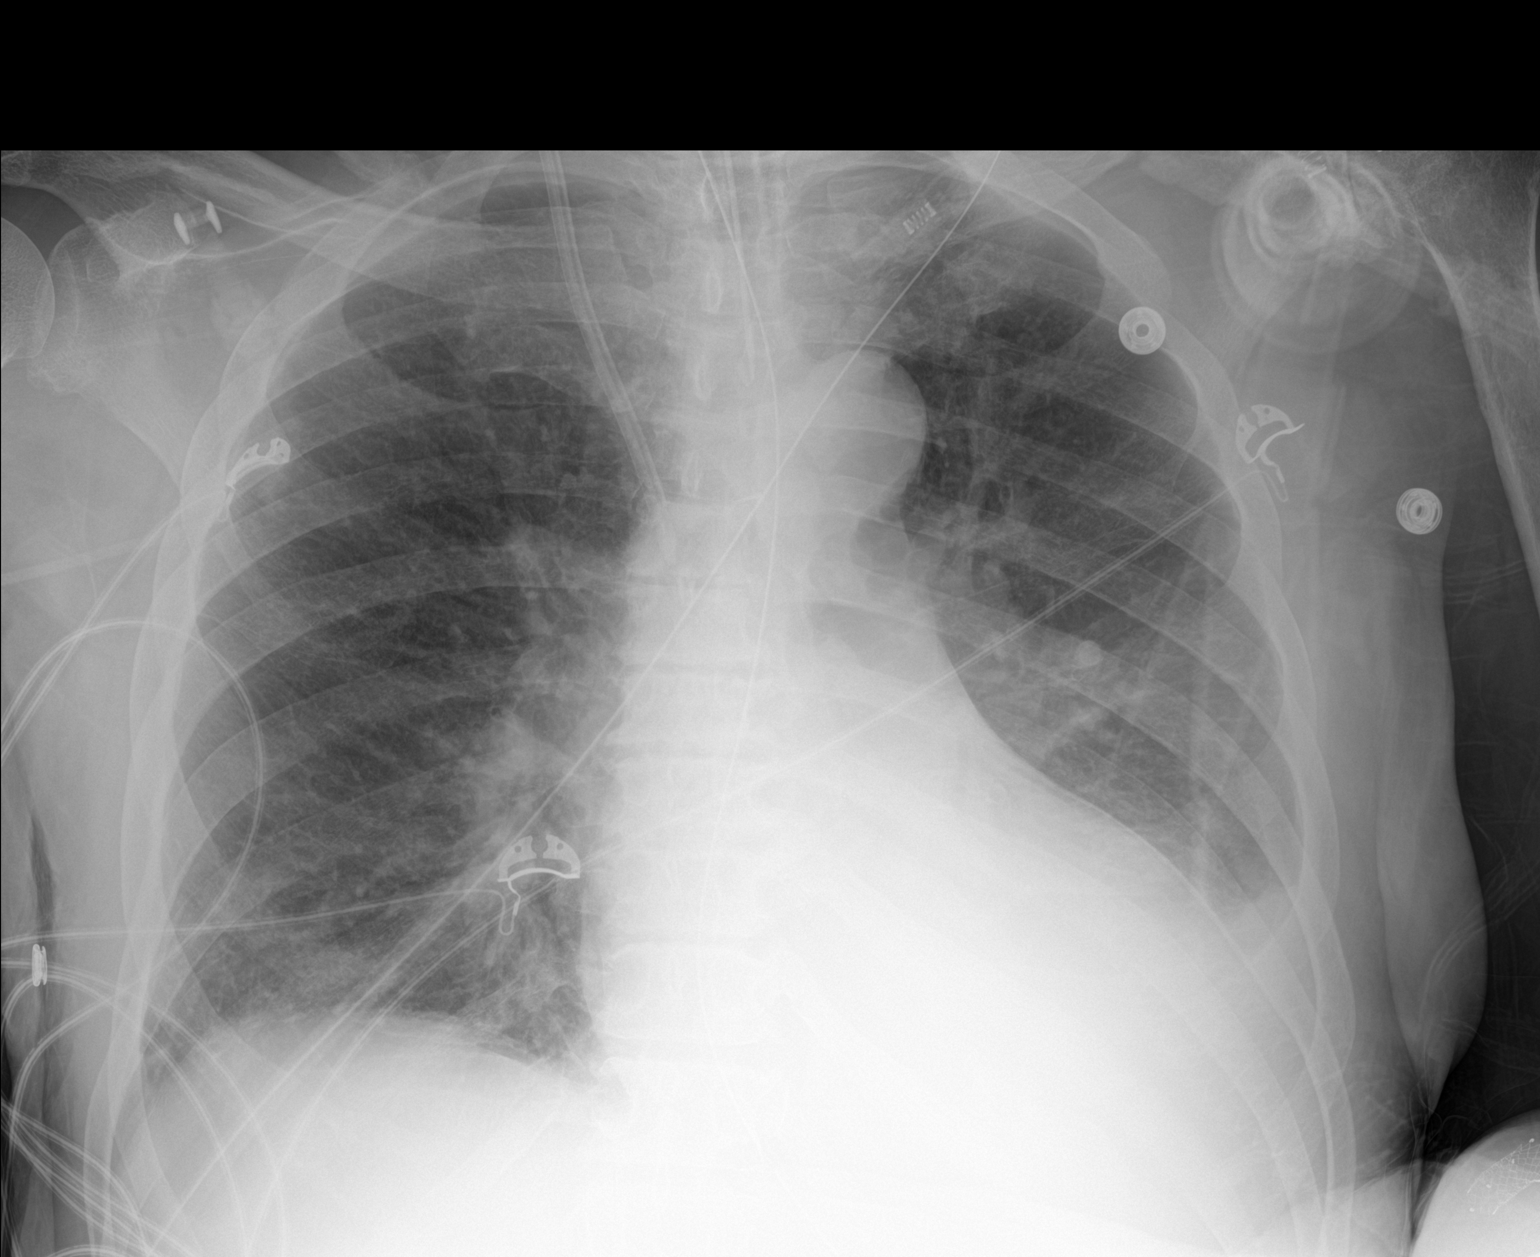

[1 of 1 positions shown; findings below may reference images not displayed]

FINDINGS: Endotracheal tube tip 4.2 cm above the carina. Nasogastric tube
enters the stomach. Right IJ central line tip: Upper SVC.

Continued left basilar airspace opacity with obscuration of the left
hemidiaphragm. The patient is rotated to the left on today's
radiograph, reducing diagnostic sensitivity and specificity. Hazy
opacity at the right lung base is stable. Borderline enlargement of
the cardiopericardial silhouette.

Severe degenerative left glenohumeral arthropathy.
IMPRESSION: 1. Overall stable appearance of the chest, with airspace opacity
obscuring the left hemidiaphragm and some hazy opacity at the right
lung base. Based on recent imaging, a component of the left basilar
density is probably attributable to pleural effusion.
2. Borderline enlargement of the cardiopericardial silhouette.
3. Severe degenerative left glenohumeral arthropathy.

## 2020-01-06 IMAGING — DX DG CHEST 1V PORT
1 series · 1 of 1 positions shown · non-contrast
Comparison: 07/13/2018

CLINICAL DATA: Respiratory failure.

EXAM:
PORTABLE CHEST 1 VIEW

[chest]
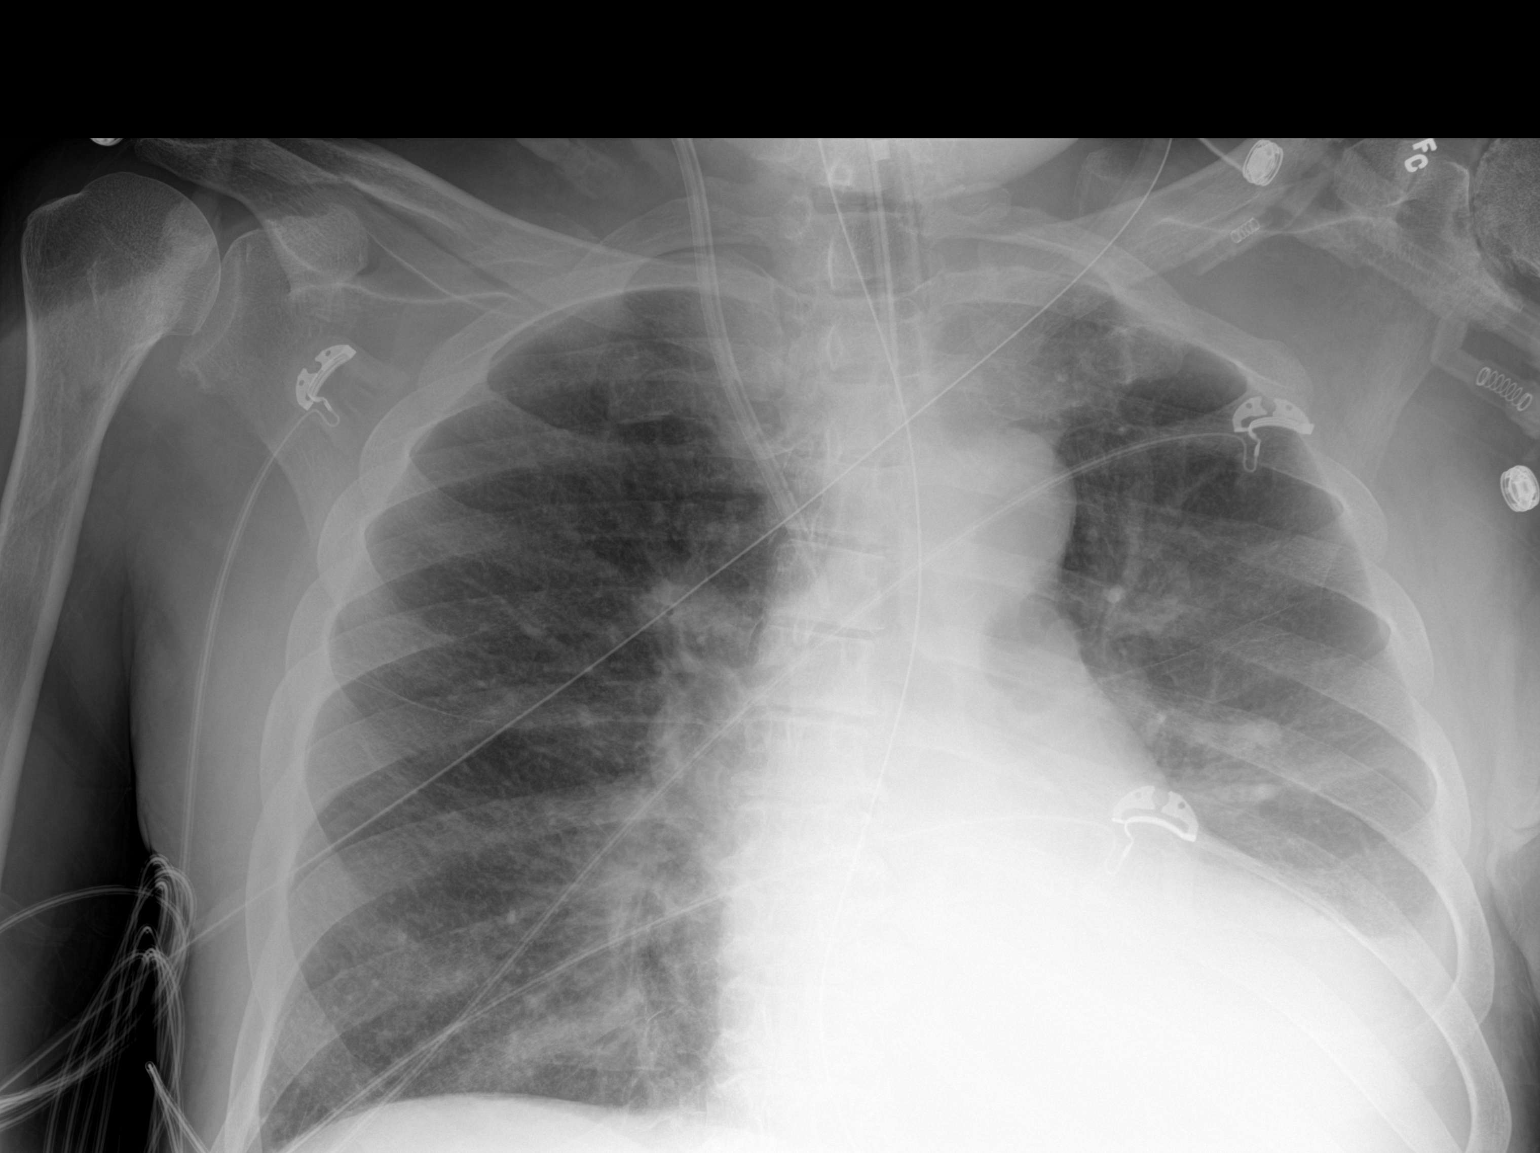

[1 of 1 positions shown; findings below may reference images not displayed]

FINDINGS: Stable support apparatus.

Enlarged cardiac silhouette.  Mediastinal contours appear intact.

No evidence of pneumothorax. Mild interstitial pulmonary edema.
Persistent left lung base opacity.

Osseous structures are without acute abnormality. Soft tissues are
grossly normal.
IMPRESSION: Persistent left lung base opacity likely represents pleural effusion
with associated atelectasis or airspace consolidation.

Probable mild interstitial pulmonary edema.

## 2020-01-18 IMAGING — CR DG CHEST 2V
2 series · 2 of 2 positions shown · non-contrast
Comparison: 07/14/2018

CLINICAL DATA: Preop valve replacement.

EXAM:
CHEST - 2 VIEW

[w chest lat]
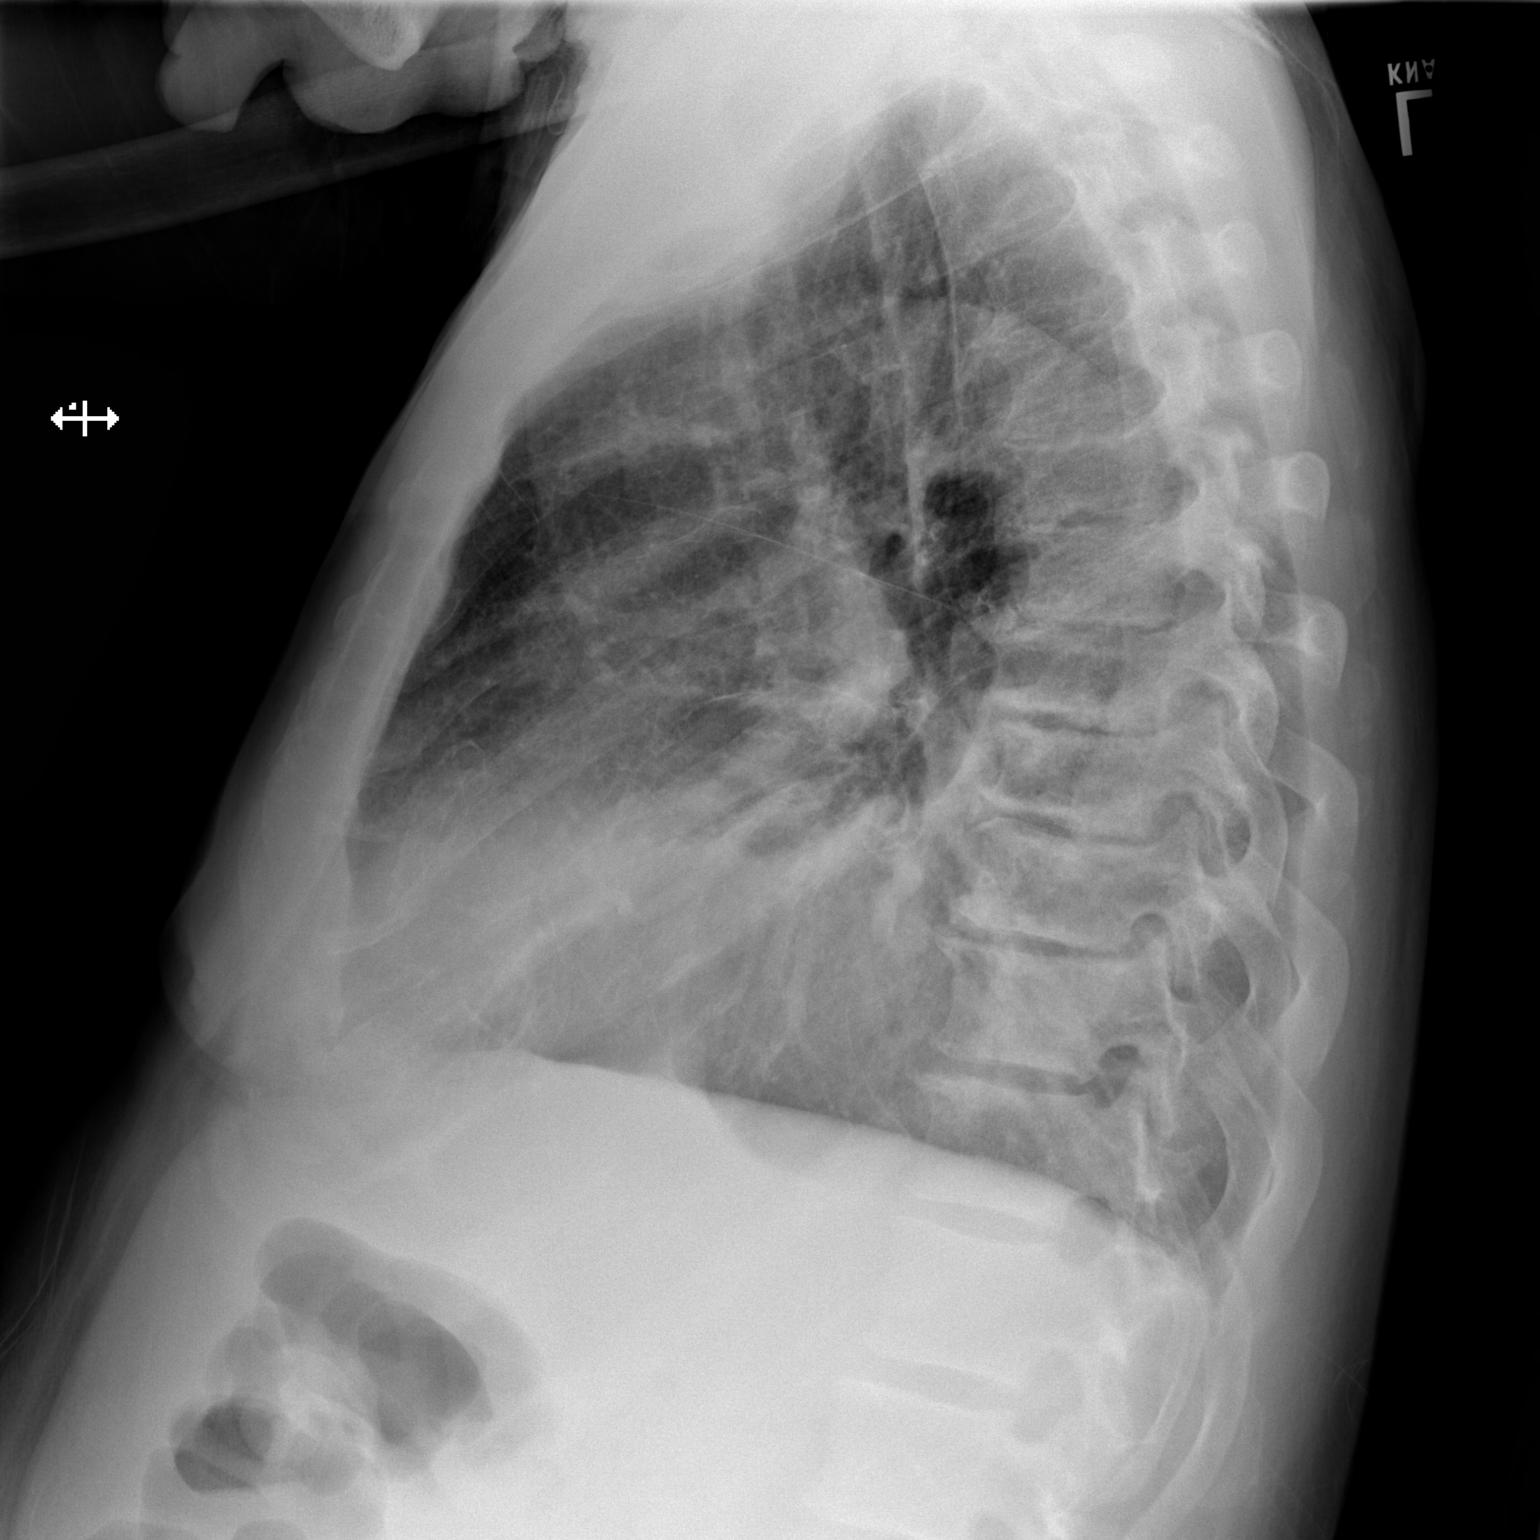

[x chest ap]
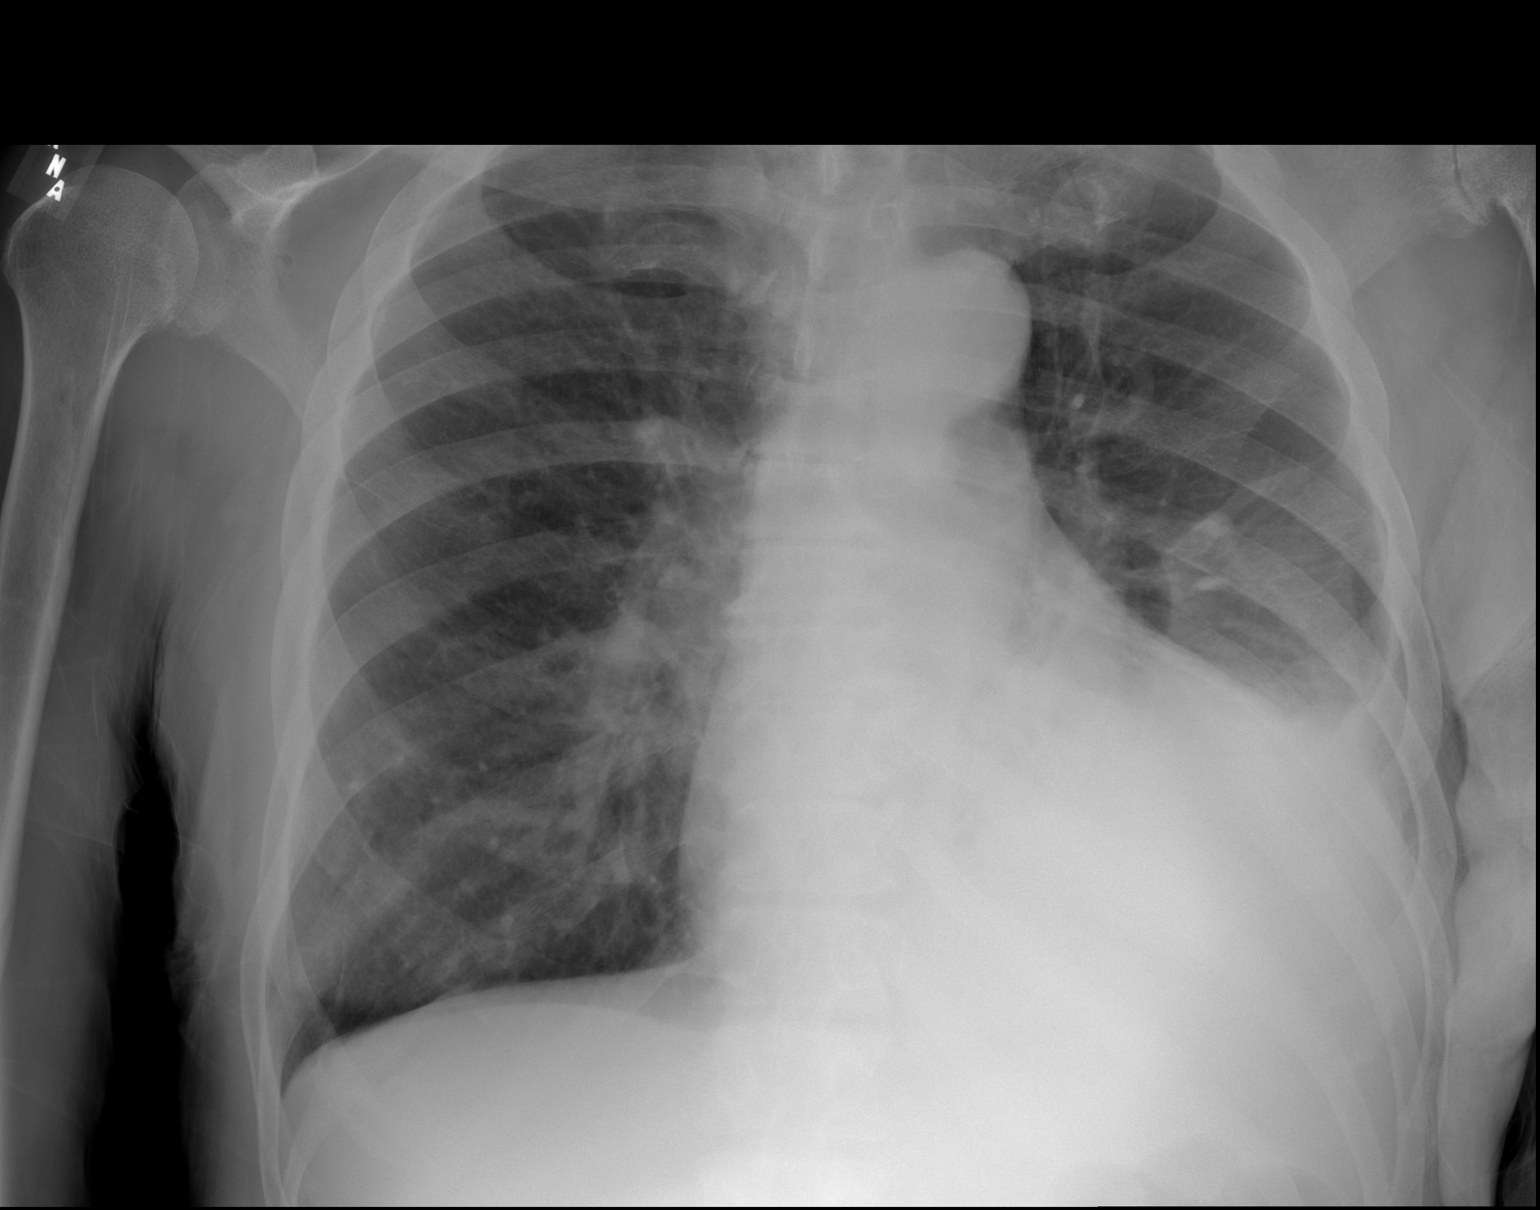

[2 of 2 positions shown; findings below may reference images not displayed]

FINDINGS: Interval extubation. The central venous catheter and enteric tube
have been removed. The there is stable cardiac enlargement. Moderate
to large left pleural effusion is identified with atelectasis of the
right lower lobe and lingula. Diffuse pulmonary vascular congestion.
IMPRESSION: 1. Persistent moderate to large left pleural effusion with pulmonary
vascular congestion.

## 2020-01-20 IMAGING — DX DG CHEST 1V PORT
1 series · 2 of 2 positions shown · non-contrast
Comparison: Two days ago

CLINICAL DATA: Postop chest

EXAM:
PORTABLE CHEST 1 VIEW

[Series 1: chest · 0.14mm/px · 2 of 2 slices shown]
[im 1/2]
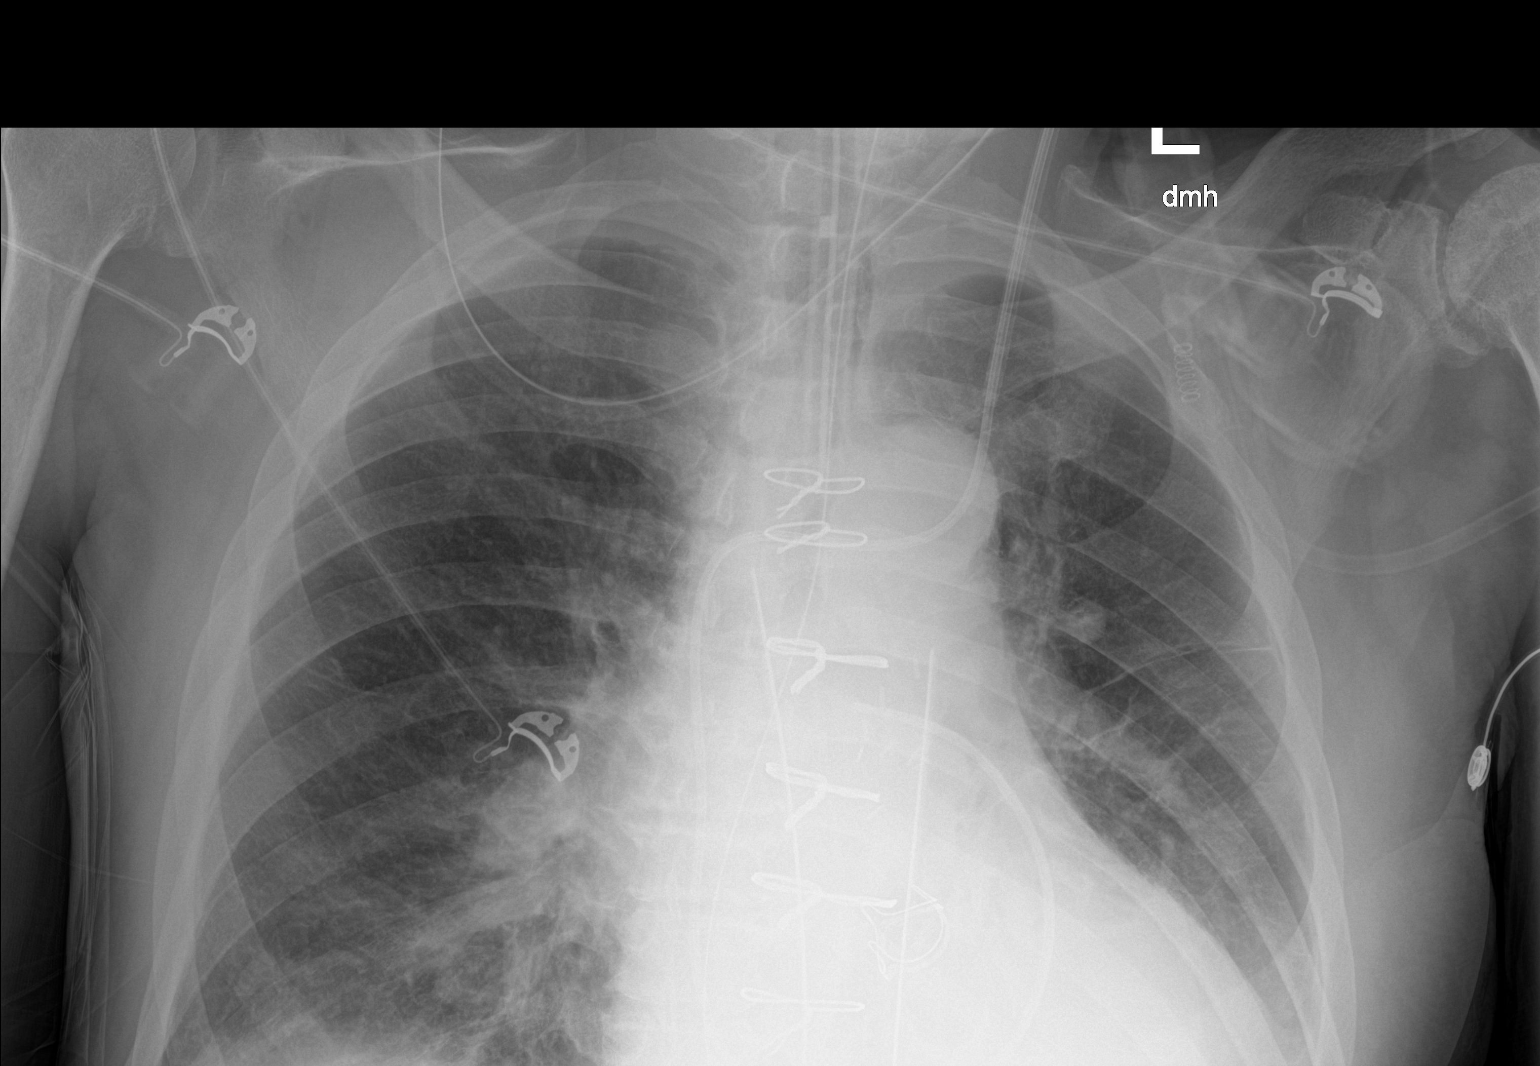
[im 2/2]
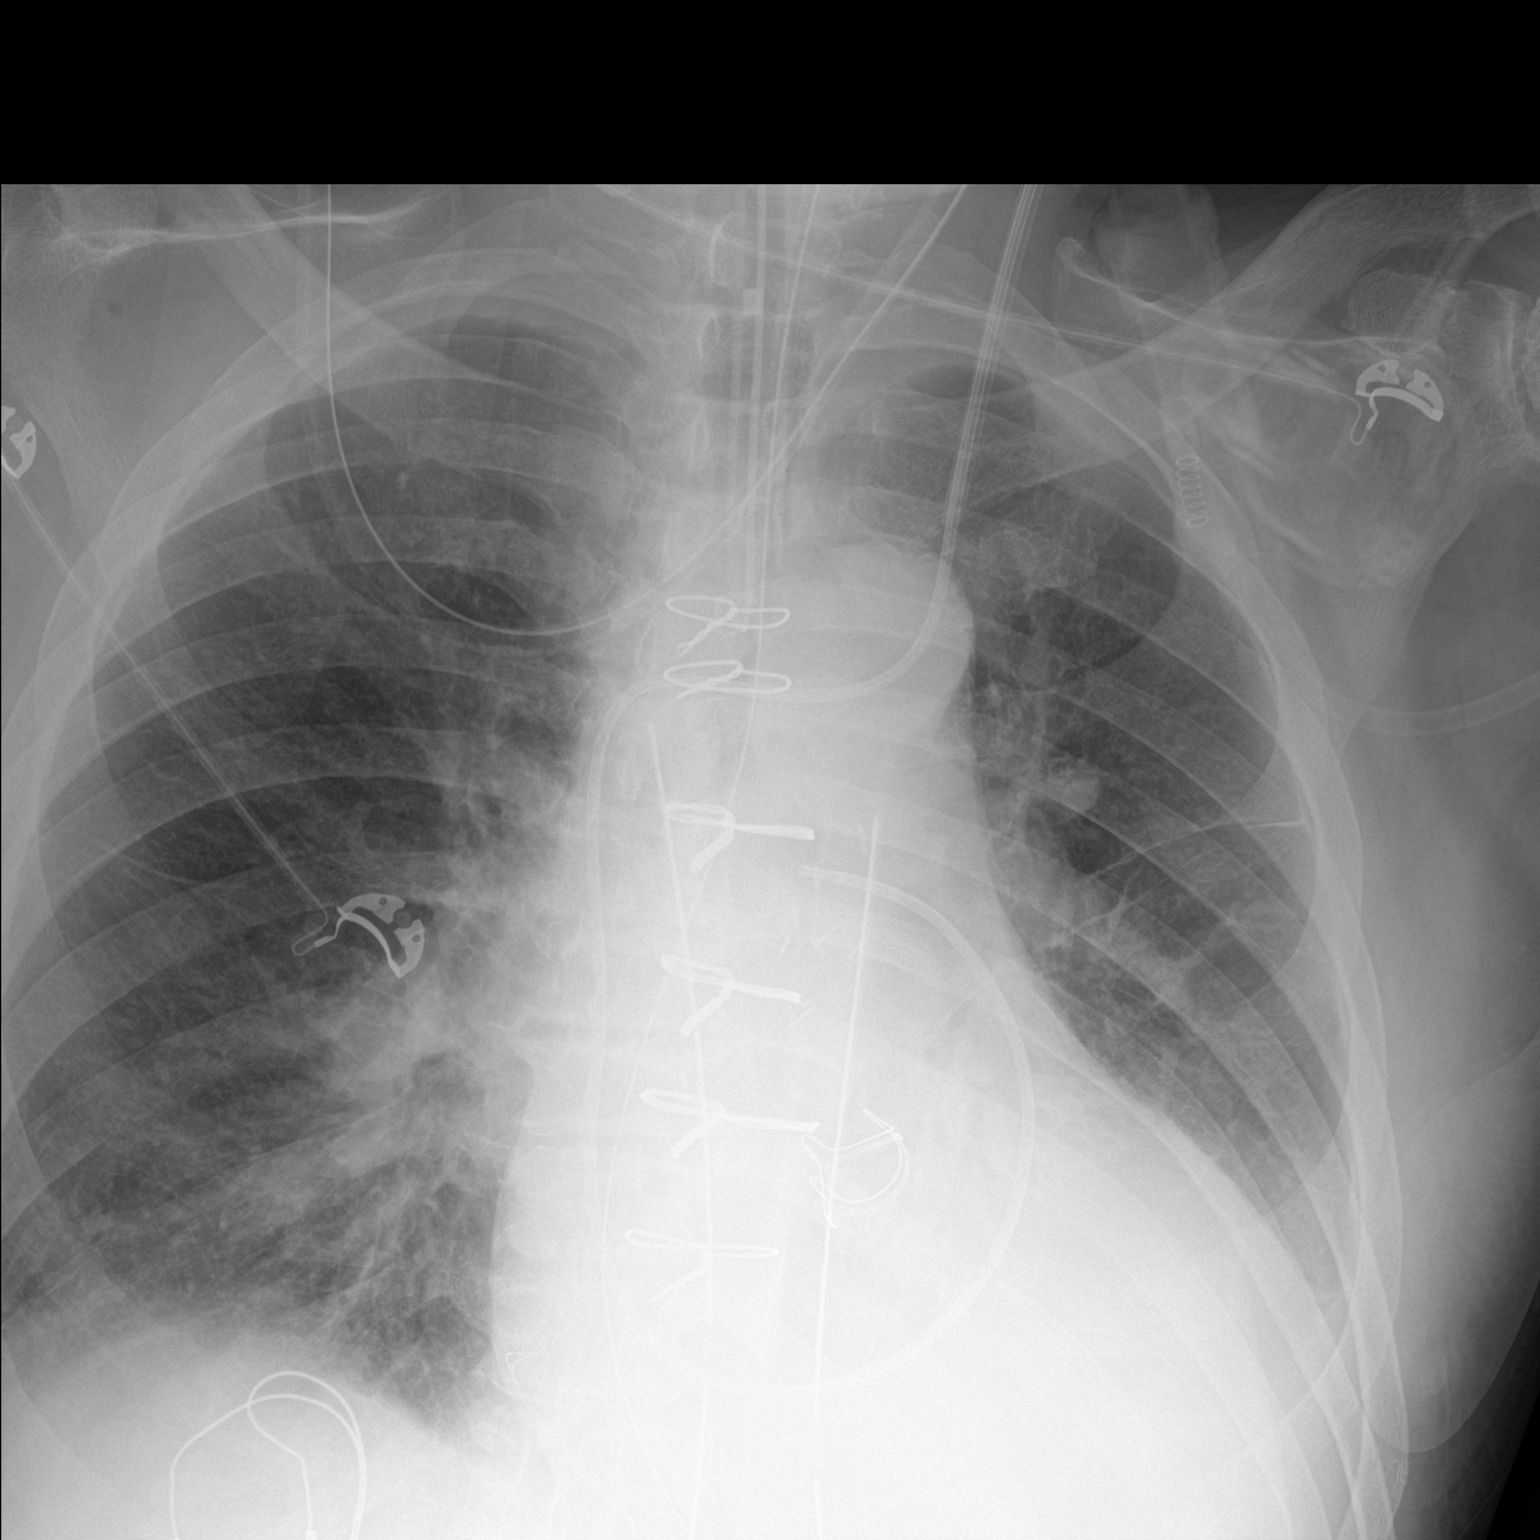

[2 of 2 positions shown; findings below may reference images not displayed]

FINDINGS: Interval median sternotomy and aortic valve replacement. Unchanged
cardiomegaly. Endotracheal tube with tip just below the clavicular
heads. An orogastric tube at least reaches the diaphragm. Thoracic
drains are present. No visible pneumothorax. There is pulmonary
edema, atelectasis, and probable left pleural effusion. Swan-Ganz
catheter from the left with tip at the central pulmonary artery
level.
IMPRESSION: 1. Pulmonary edema, atelectasis, and left pleural effusion.
2. Unremarkable hardware positioning.

## 2020-01-21 IMAGING — DX DG CHEST 1V PORT
1 series · 1 of 1 positions shown · non-contrast
Comparison: 07/28/2018

CLINICAL DATA: Chest tube.  Post AVR.

EXAM:
PORTABLE CHEST 1 VIEW

[chest]
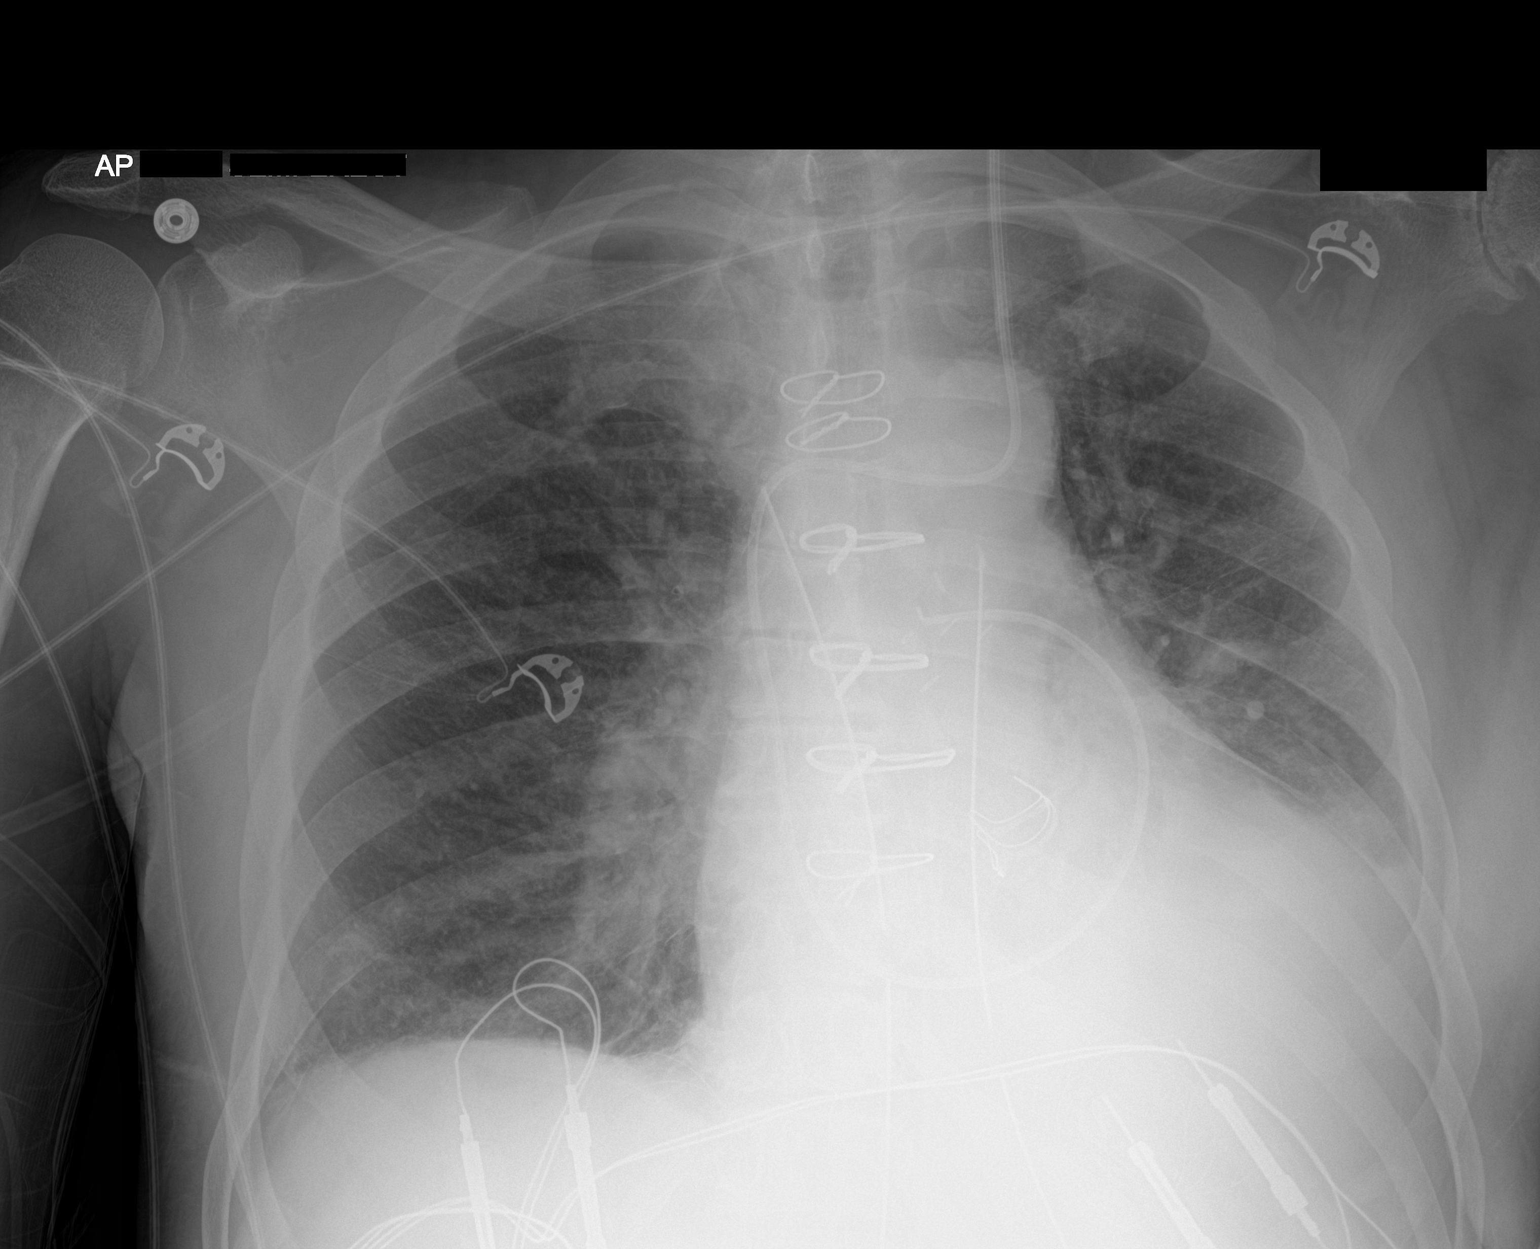

[1 of 1 positions shown; findings below may reference images not displayed]

FINDINGS: Previous median sternotomy and AVR. Two thoracic drains over the mid
and left chest unchanged. Left IJ Swan-Ganz catheter has tip over
the proximally and right pulmonary artery. Lungs are adequately
inflated demonstrate persistent opacification over the left mid to
lower lung likely effusion with associated atelectasis. Mild hazy
opacification of the perihilar markings unchanged likely mild
vascular congestion. Mild stable cardiomegaly as remainder of the
exam is unchanged.
IMPRESSION: Stable left base opacification likely effusion with atelectasis.
Mild cardiomegaly with mild vascular congestion.

Tubes and lines as described.

## 2020-01-22 IMAGING — DX DG CHEST 1V PORT
1 series · 1 of 1 positions shown · non-contrast
Comparison: 07/29/2018

CLINICAL DATA: Post AVR.

EXAM:
PORTABLE CHEST 1 VIEW

[chest]
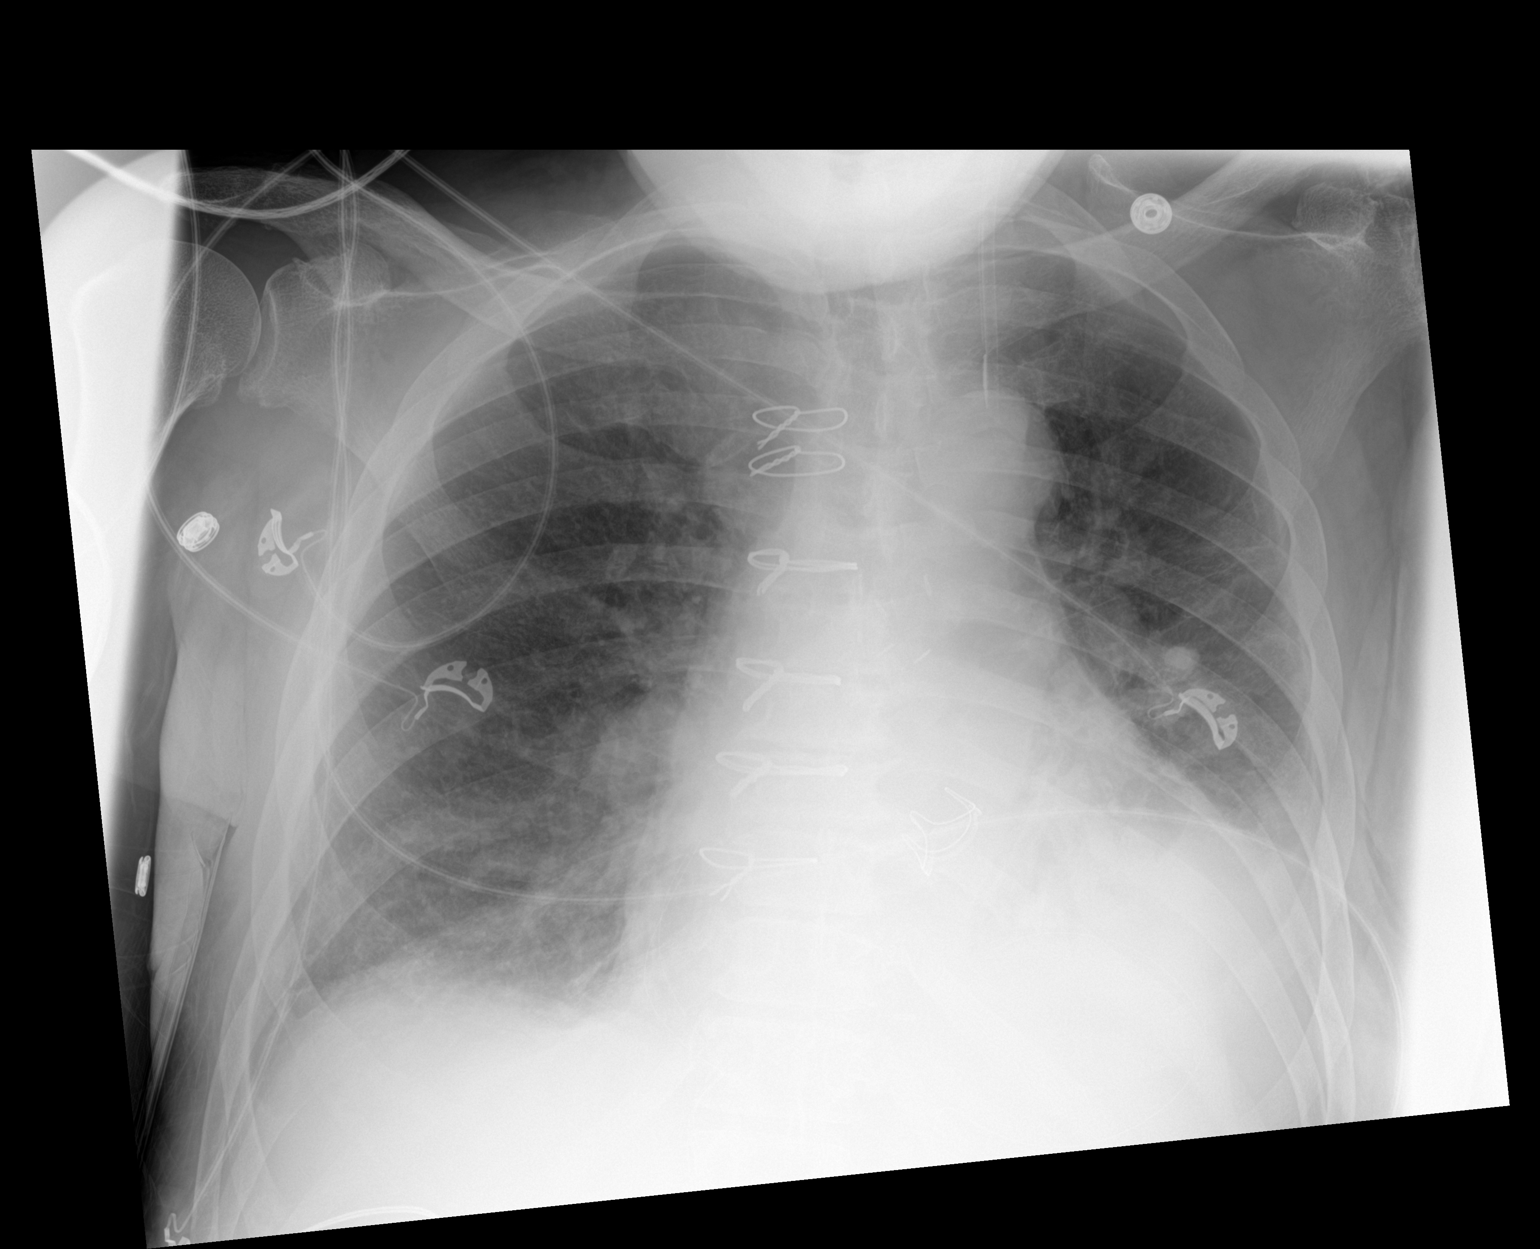

[1 of 1 positions shown; findings below may reference images not displayed]

FINDINGS: Left IJ sheath remains in place with tip near the junction of the
internal jugular vein to brachiocephalic vein.. Lungs are somewhat
hypoinflated with persistent opacification over the left mid to
lower lung likely moderate size effusion with associated basilar
atelectasis. Mild prominence of the perihilar markings suggesting a
degree of vascular congestion. Mild stable cardiomegaly. Remainder
of the exam is unchanged.
IMPRESSION: Stable opacification over the left mid to lower lung likely moderate
effusion with associated basilar atelectasis. Cardiomegaly with a
suggestion of a stable degree of vascular congestion.

Left IJ venous sheath in place.

## 2020-01-23 IMAGING — DX DG CHEST 1V PORT
1 series · 1 of 1 positions shown · non-contrast
Comparison: 07/30/2018 and older exams.

CLINICAL DATA: Followup cardiac surgery and aortic valve
replacement.

EXAM:
PORTABLE CHEST 1 VIEW

[chest ap]
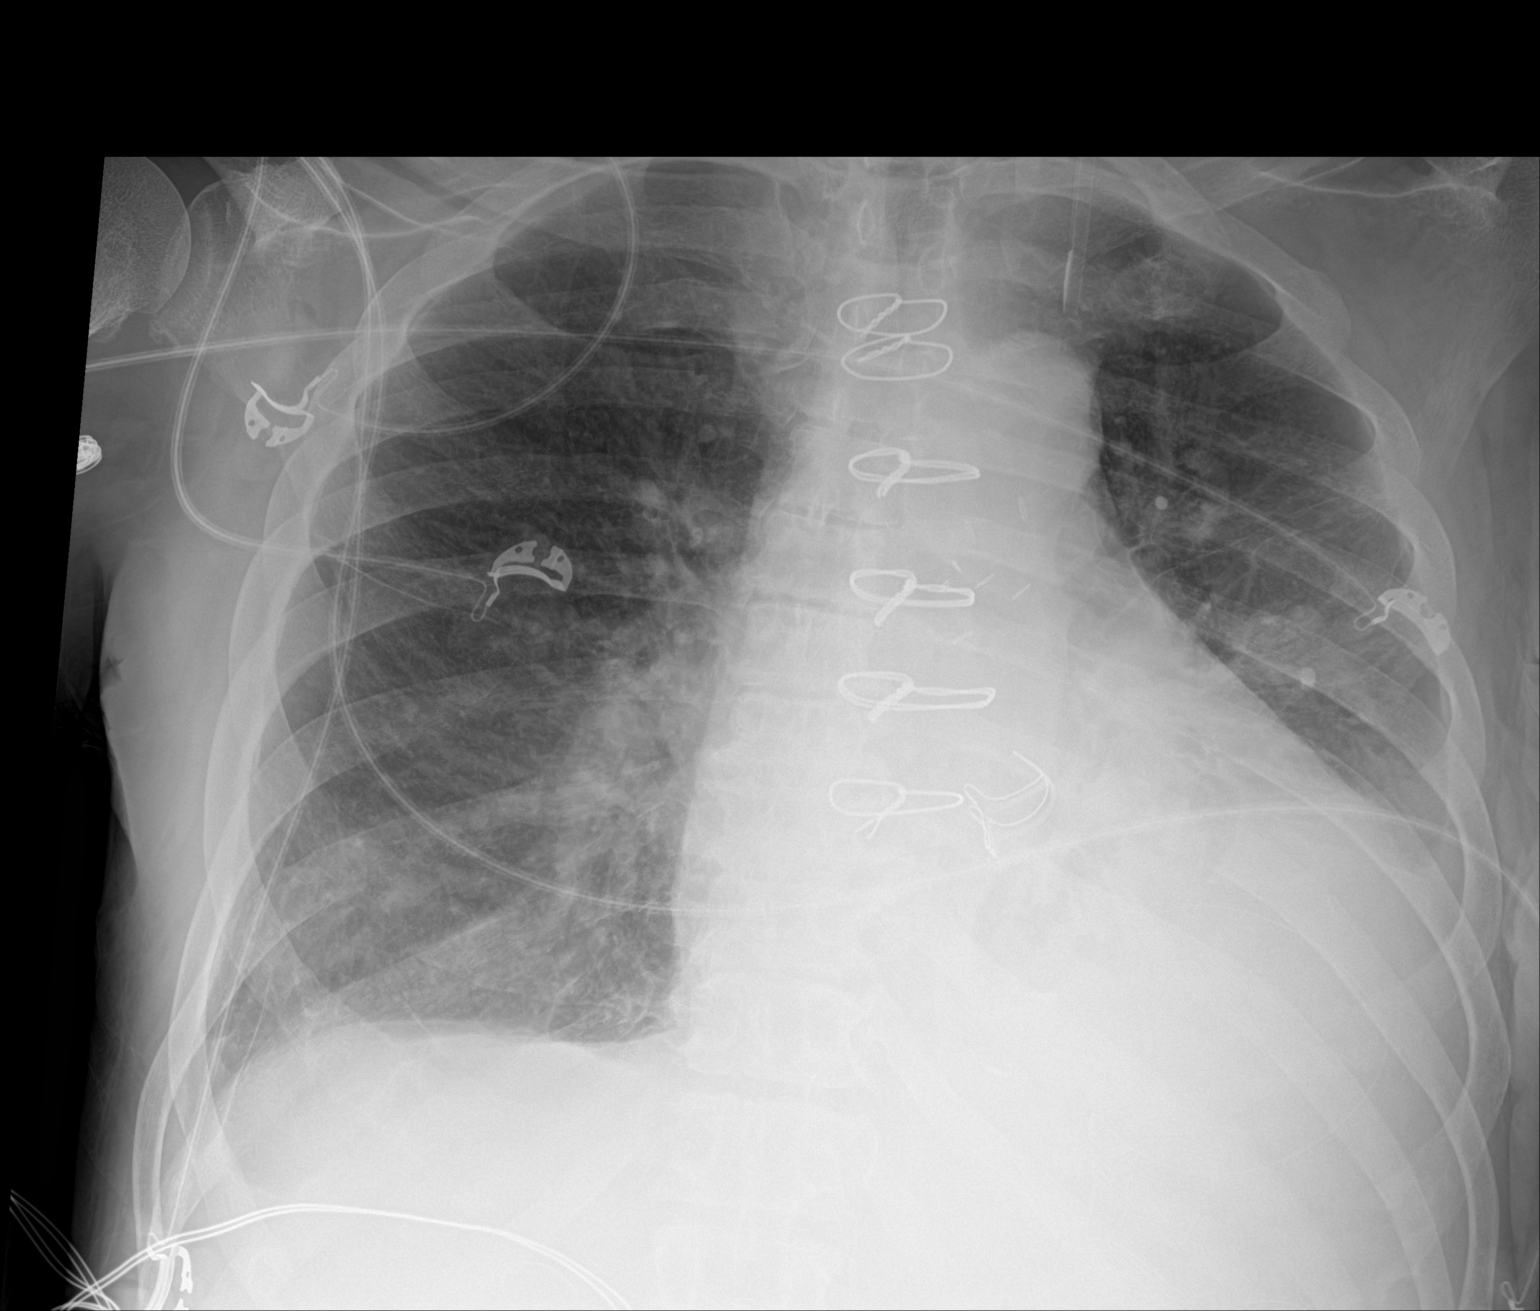

[1 of 1 positions shown; findings below may reference images not displayed]

FINDINGS: Confluent opacity at the left lung base, consistent with pleural
fluid and atelectasis, is stable. Mild opacity at the right lung
base is also stable consistent with a small effusion with mild
atelectasis.

There are prominent bronchovascular markings without convincing
pulmonary edema.

No pneumothorax.

No mediastinal widening.

Left internal jugular introducer sheath is stable.
IMPRESSION: 1. No significant change from the most recent prior exam allowing
for differences in patient positioning and technique. No mediastinal
widening, convincing pulmonary edema or pneumothorax.
2. Left greater than right lung base opacity consistent with a
combination of pleural fluid with atelectasis.

## 2020-01-26 IMAGING — DX DG CHEST 1V PORT
1 series · 1 of 1 positions shown · non-contrast
Comparison: 08/02/2018

CLINICAL DATA: Post AVR, chest soreness

EXAM:
PORTABLE CHEST 1 VIEW

[chest]
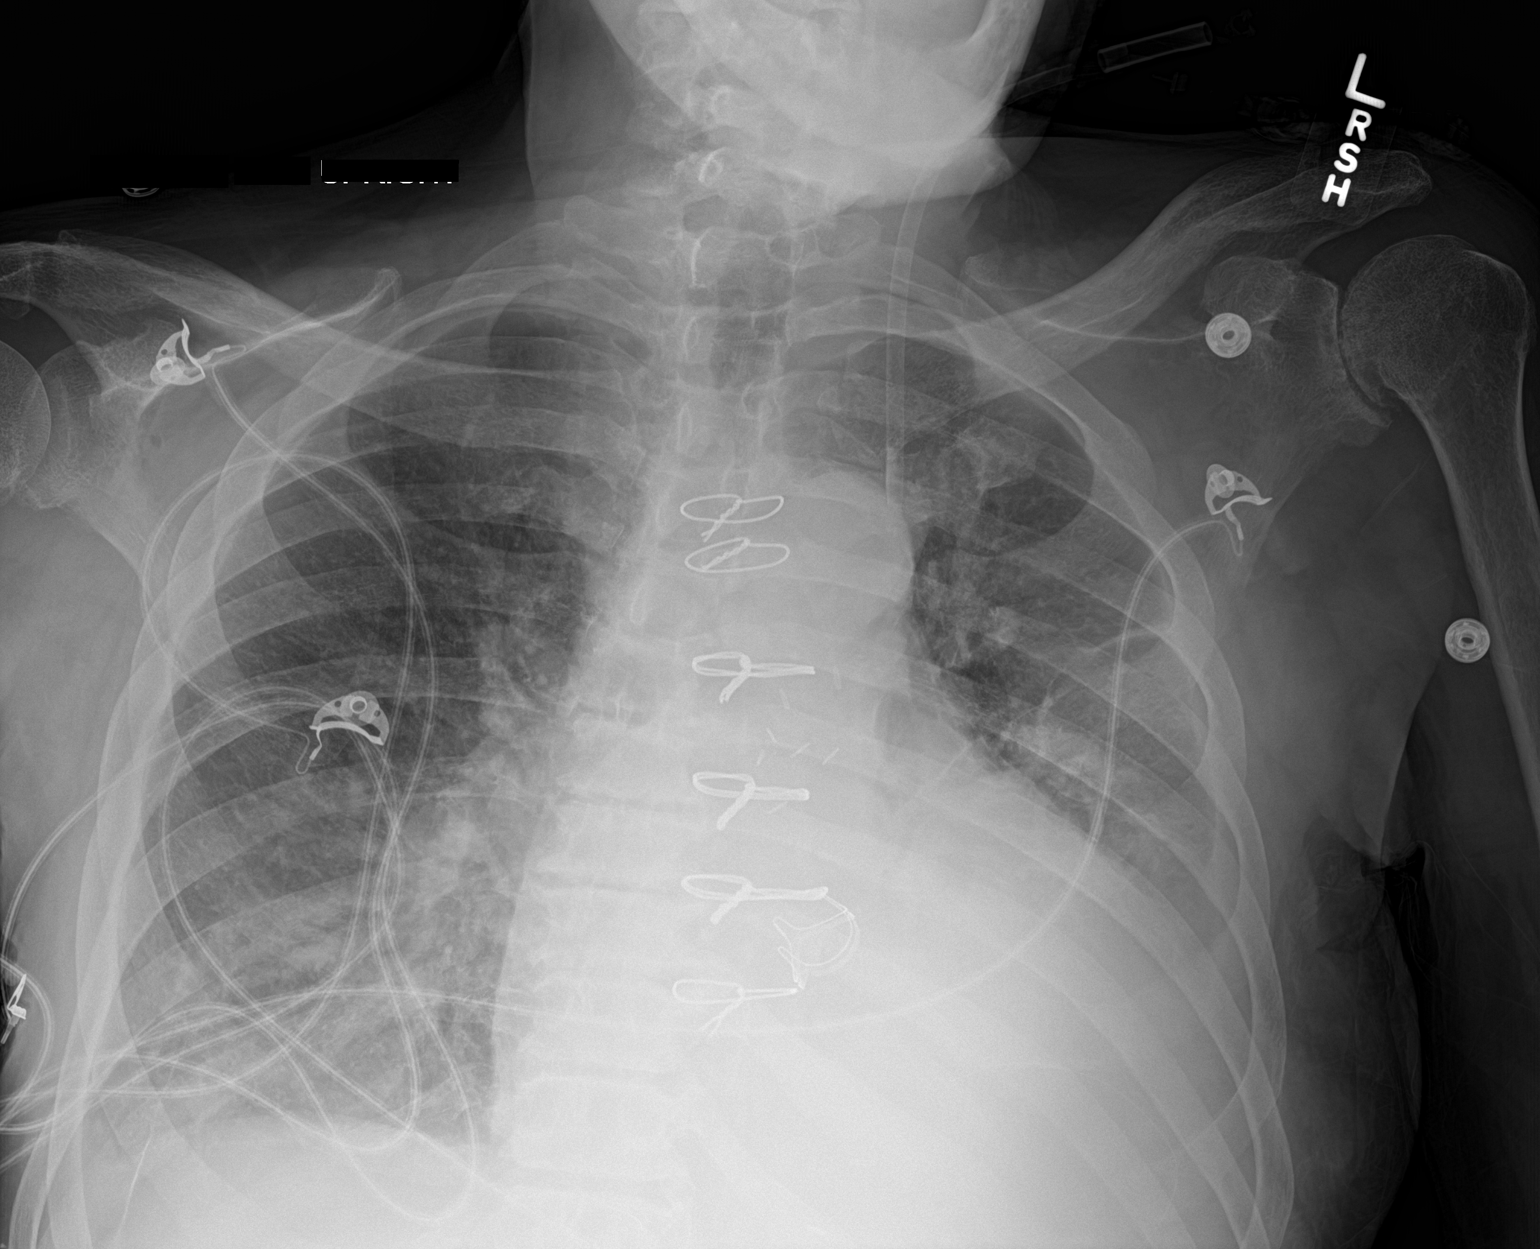

[1 of 1 positions shown; findings below may reference images not displayed]

FINDINGS: Postoperative changes from aortic valve replacement. Cardiomegaly
with vascular congestion and mild pulmonary edema. Probable small
effusions. Bibasilar atelectasis or infiltrates, left greater than
right. No real change since prior study. No pneumothorax.
IMPRESSION: Postoperative changes. Cardiomegaly with vascular congestion and
mild pulmonary edema.

Small effusions with bibasilar atelectasis or infiltrates, left
greater than right. No pneumothorax.

## 2020-02-02 IMAGING — DX DG CHEST 1V PORT
1 series · 1 of 1 positions shown · non-contrast
Comparison: Five days ago

CLINICAL DATA: Shortness of

EXAM:
PORTABLE CHEST 1 VIEW

[chest]
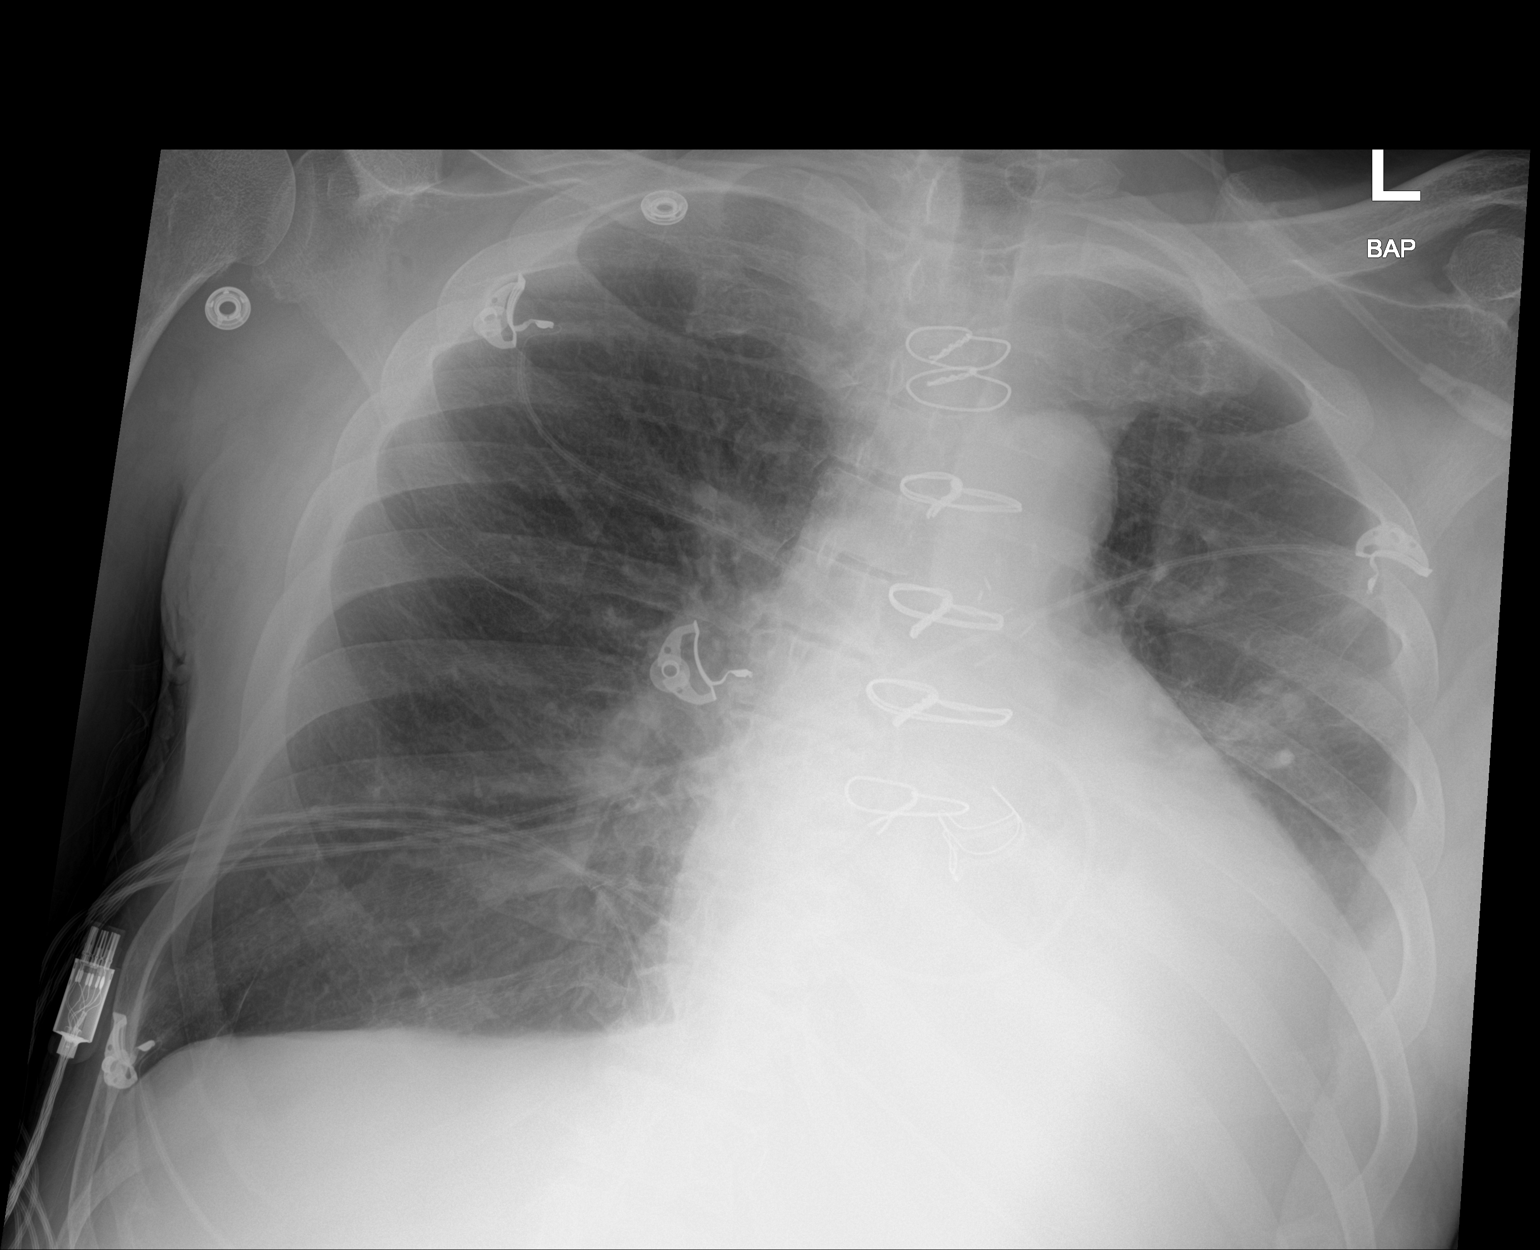

[1 of 1 positions shown; findings below may reference images not displayed]

FINDINGS: Left pleural effusion with opacified lower lobe, similar to prior.
Cardiomegaly. Status post aortic valve replacement. Large volume
right lung. No Kerley lines, air bronchogram, or pneumothorax.
IMPRESSION: Unchanged left pleural effusion with opacified or obscured lower
lobe. A left pleural effusion has been noted since March 2018.

## 2020-03-14 IMAGING — CR DG CHEST 2V
2 series · 2 of 2 positions shown · non-contrast
Comparison: Chest x-ray dated August 10, 2018.

CLINICAL DATA: Recent aortic valve replacement. No chest
complaints.

EXAM:
CHEST - 2 VIEW

[w chest lat]
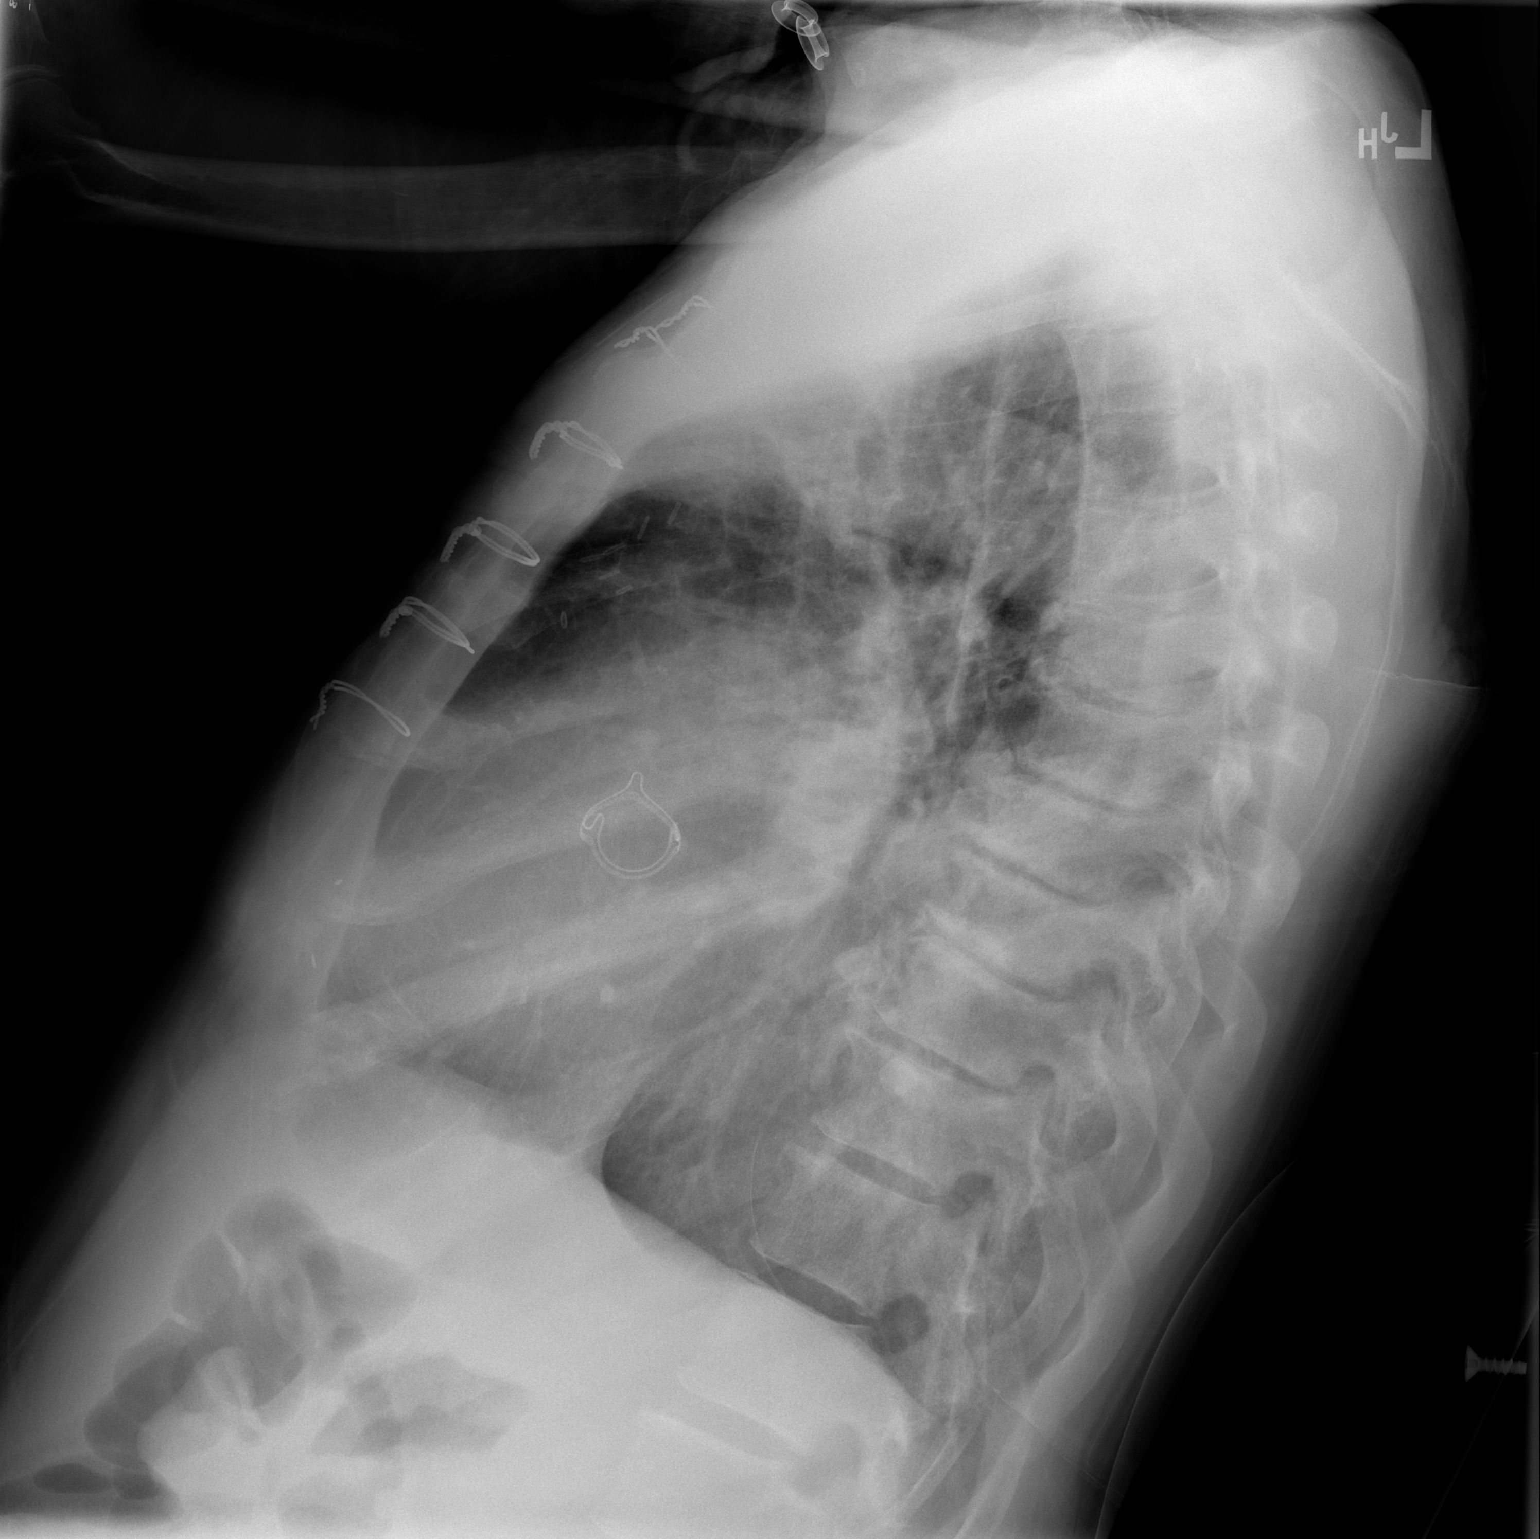

[w chest ap]
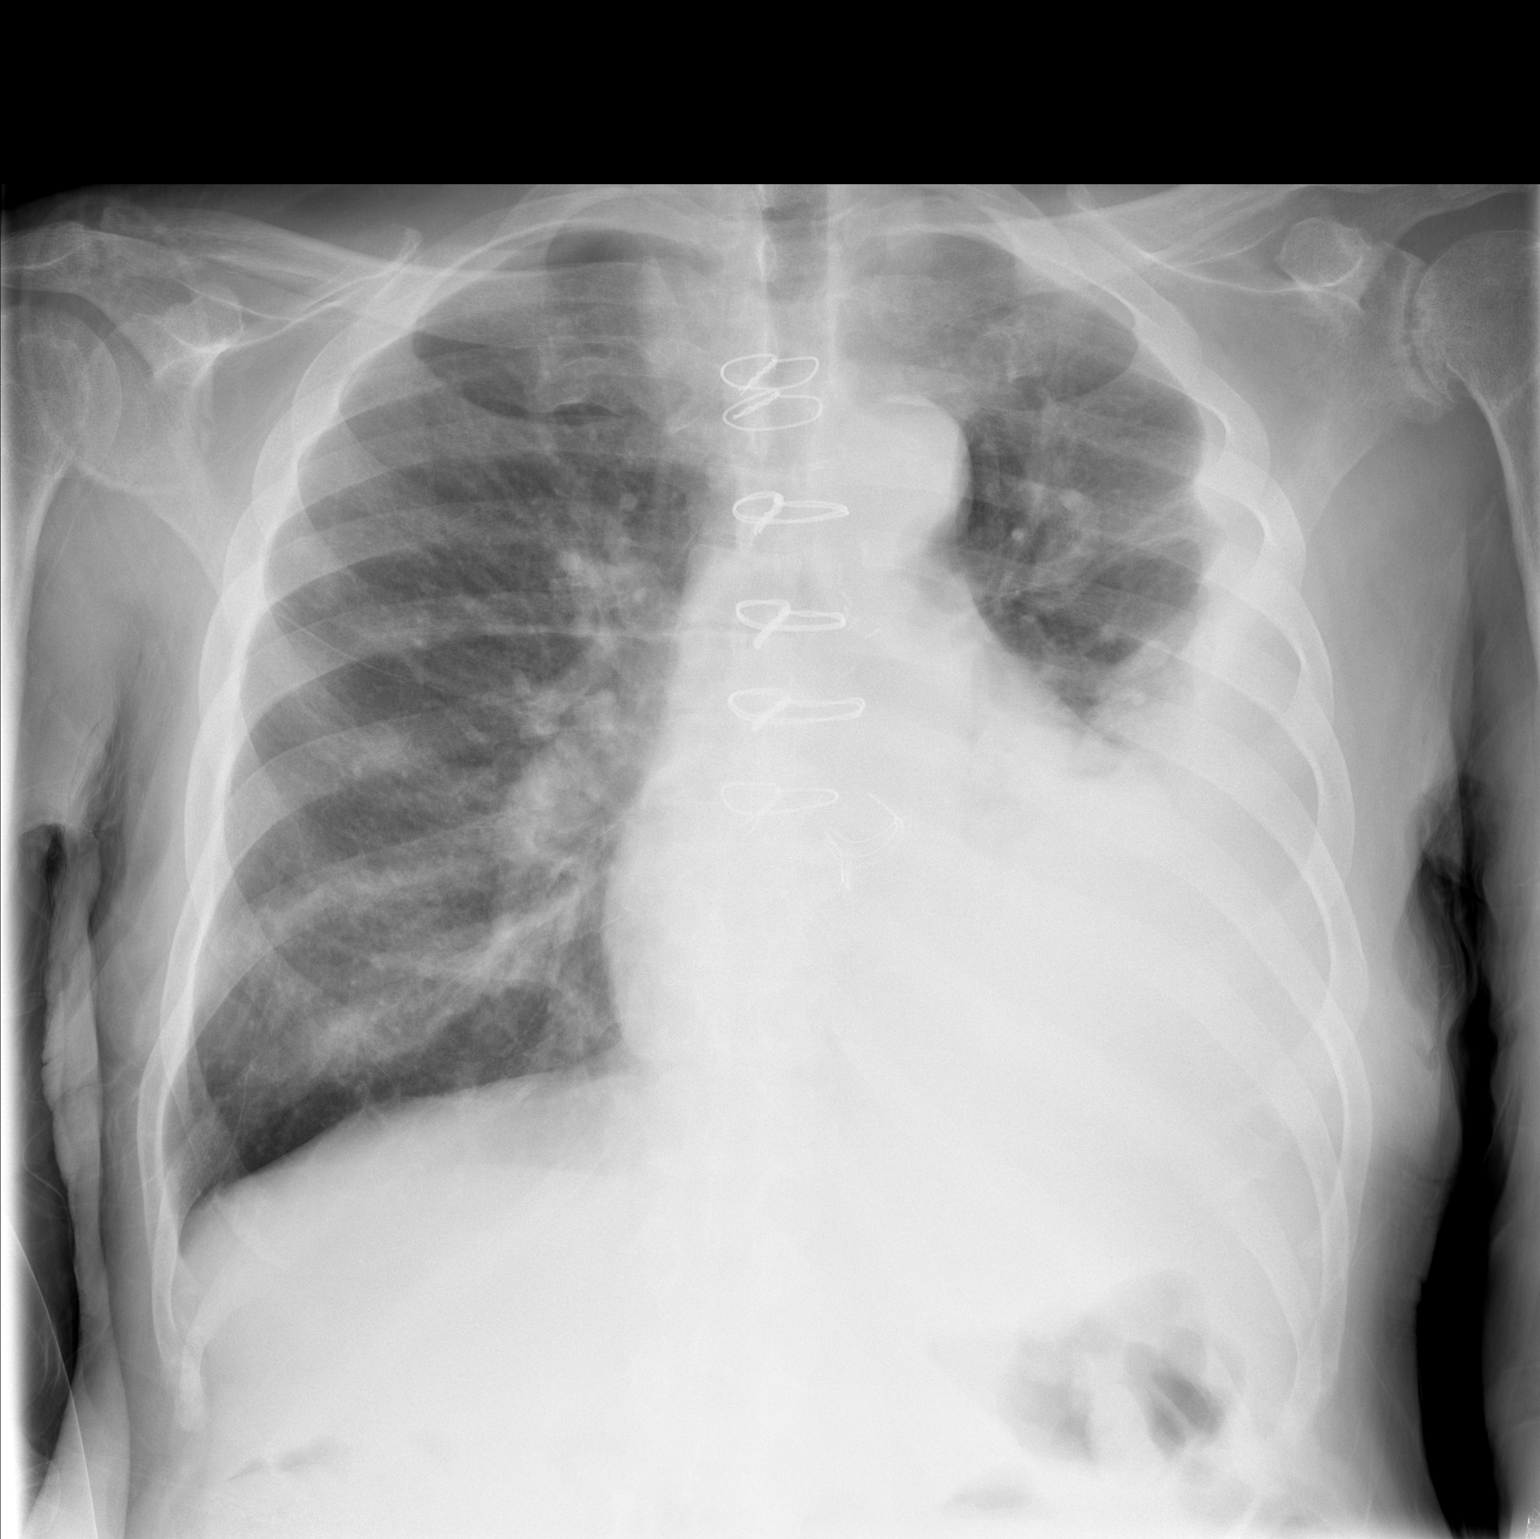

[2 of 2 positions shown; findings below may reference images not displayed]

FINDINGS: Unchanged cardiomegaly status post aortic valve replacement.
Increasing moderate to large left pleural effusion with left
lingular and lower lobe atelectasis. New small opacities in the
right mid to lower lung. No pneumothorax. No acute osseous
abnormality.
IMPRESSION: 1. Increasing moderate to large left pleural effusion with worsening
lingular and lower lobe atelectasis.
2. Small opacities in the right mid to lower lung could reflect
asymmetric edema or infection/inflammation.

## 2020-03-28 IMAGING — DX DG CHEST 1V
1 series · 1 of 1 positions shown · non-contrast
Comparison: 09/20/2018 and earlier.

CLINICAL DATA: Post LEFT thoracentesis.

EXAM:
CHEST  1 VIEW

[chest pa]
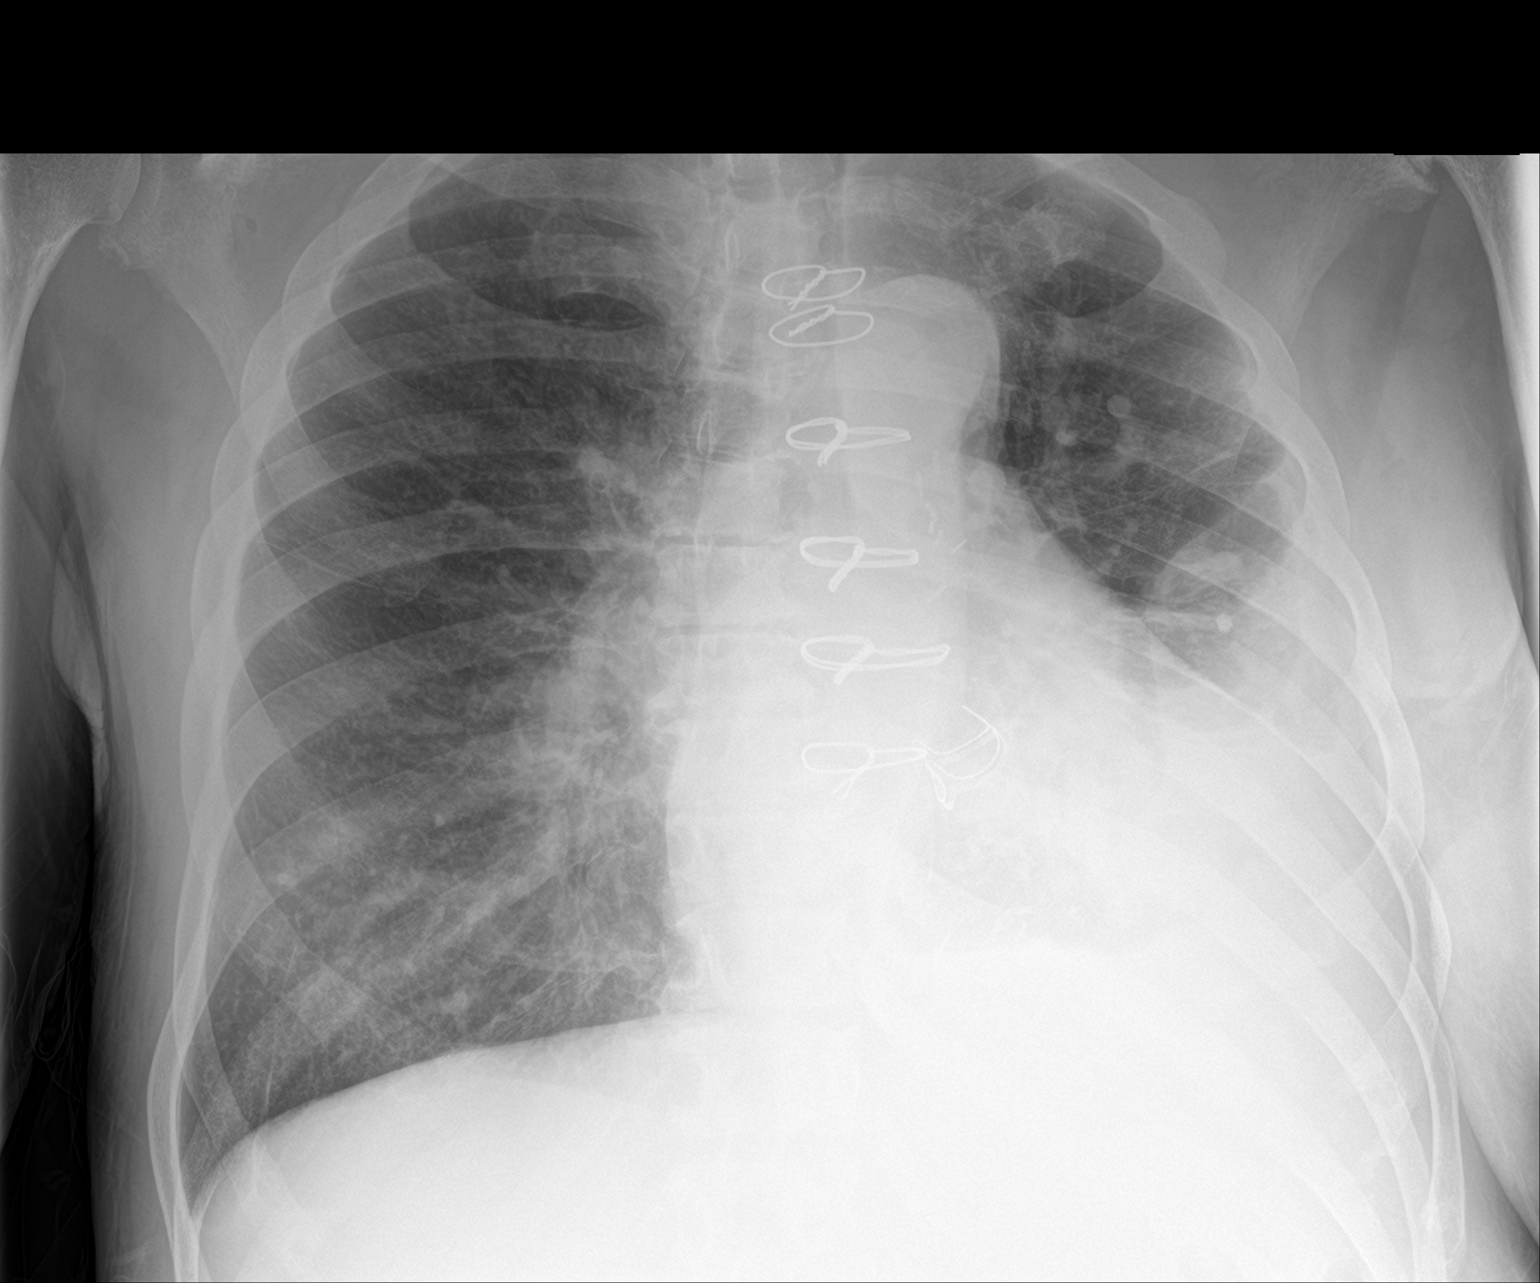

[1 of 1 positions shown; findings below may reference images not displayed]

FINDINGS: No evidence of pneumothorax after LEFT thoracentesis. Reduction in
size of the still large LEFT pleural effusion. Improved aeration in
the LEFT LOWER LOBE and lingula, though airspace consolidation
persists. Mild pulmonary venous hypertension without overt edema.
Lungs otherwise clear.

Prior aortic valve replacement. Cardiac silhouette moderately
enlarged, unchanged.
IMPRESSION: 1. No pneumothorax after LEFT thoracentesis.
2. Reduction in size of the still large LEFT pleural effusion.
3. Improved aeration in the LEFT LOWER LOBE and lingula, though
passive atelectasis (favored over pneumonia) persists.
4. No new abnormalities.

## 2020-03-28 IMAGING — US IR THORACENTESIS ASP PLEURAL SPACE W/IMG GUIDE
1 series · 2 of 2 positions shown · non-contrast
Comparison: none

INDICATION: Status post recent open heart surgery. Left-sided pleural effusion.
Request for therapeutic thoracentesis.

[Series 1: ir (id) (id)/(id)/(id) ir · 2 of 2 slices shown]
[im 1/2]
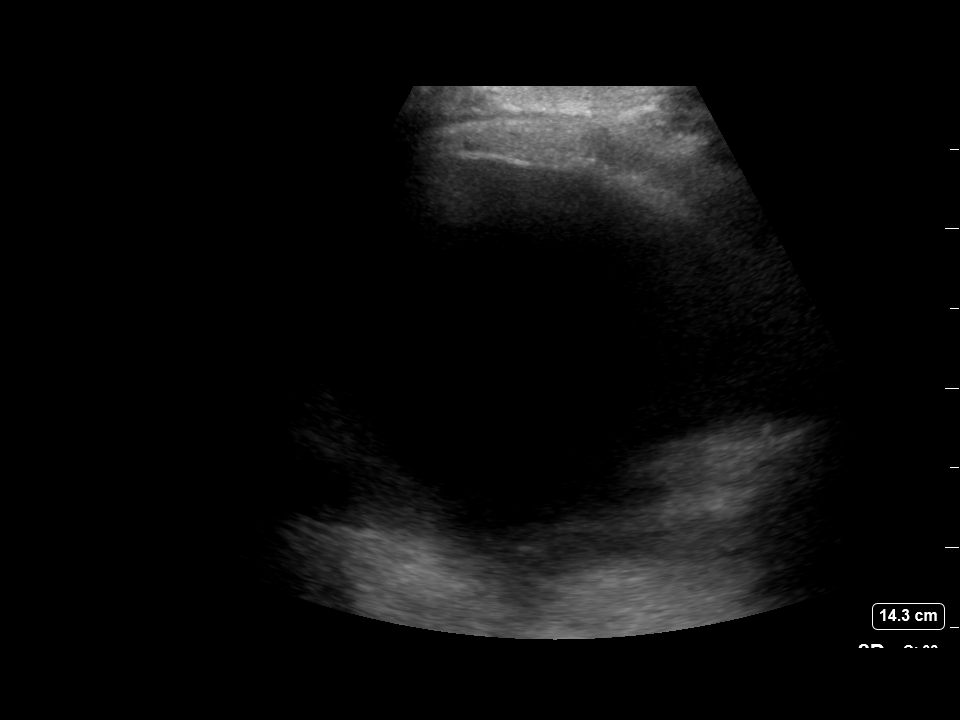
[im 2/2]
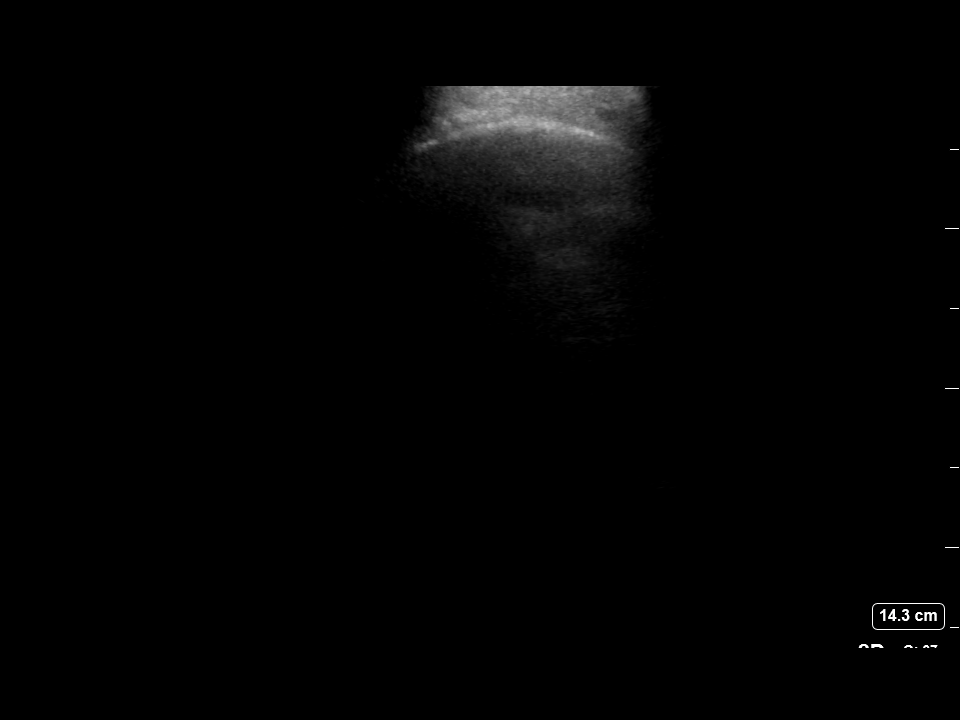

[2 of 2 positions shown; findings below may reference images not displayed]

EXAM:
ULTRASOUND GUIDED LEFT THORACENTESIS

MEDICATIONS:
None.

COMPLICATIONS:
None immediate.

PROCEDURE:
An ultrasound guided thoracentesis was thoroughly discussed with the
patient and questions answered. The benefits, risks, alternatives
and complications were also discussed. The patient understands and
wishes to proceed with the procedure. Written consent was obtained.

Ultrasound was performed to localize and mark an adequate pocket of
fluid in the left chest. The area was then prepped and draped in the
normal sterile fashion. 1% Lidocaine was used for local anesthesia.
Under ultrasound guidance a 6 Fr Safe-T-Centesis catheter was
introduced. Thoracentesis was performed. Patient complained of cough
and chest tightness. The catheter was removed and a dressing
applied.
FINDINGS: A total of approximately 1 L of clear, amber colored fluid was
removed.
IMPRESSION: Successful ultrasound guided left thoracentesis yielding 1 L of
pleural fluid.

Residual effusion remains.

No pneumothorax on follow-up radiograph.

## 2020-04-18 IMAGING — DX DG CHEST 2V
2 series · 2 of 2 positions shown · non-contrast
Comparison: 10/04/2018.

CLINICAL DATA: Status post AVR.

EXAM:
CHEST - 2 VIEW

[dg chest 2 view (1 of 2)]
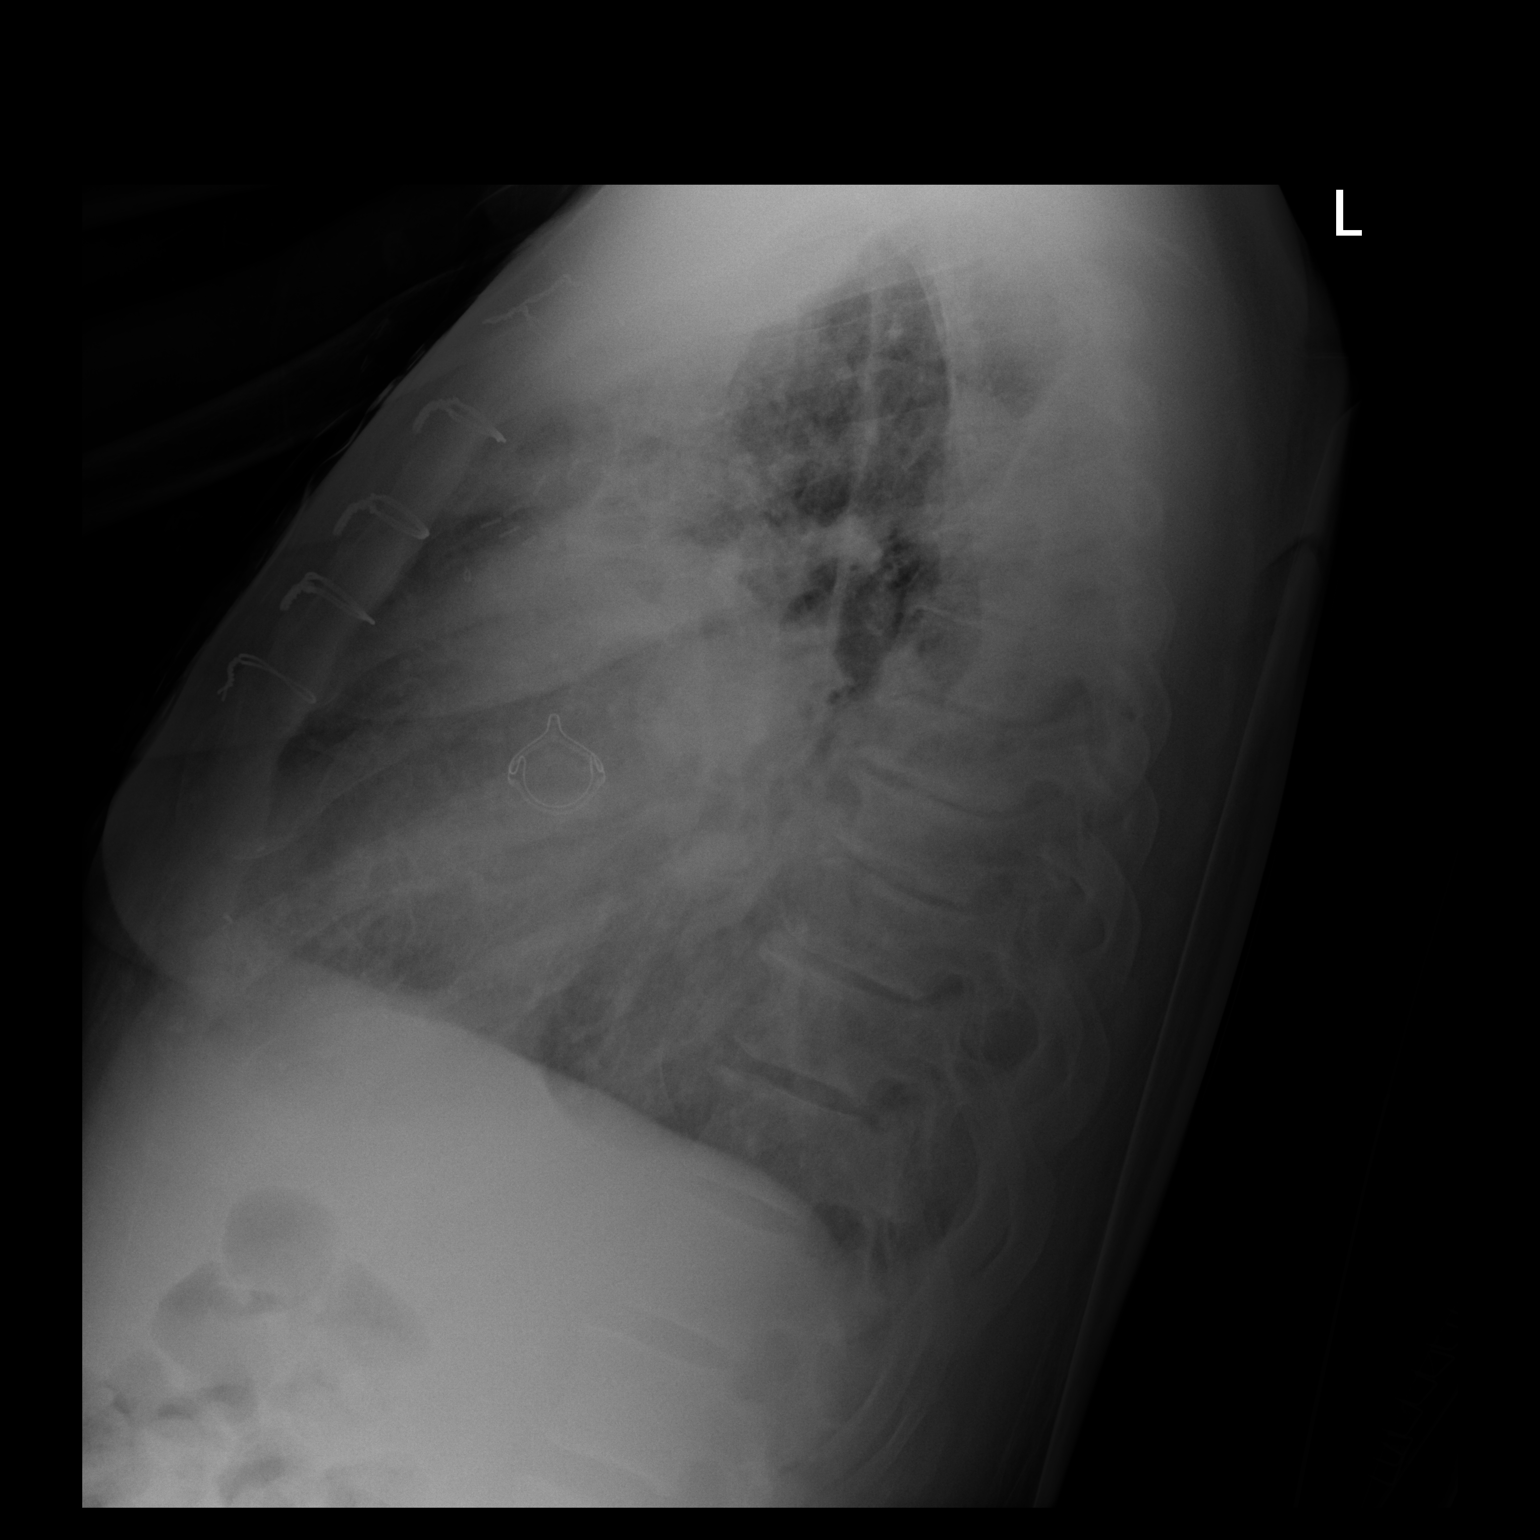

[dg chest 2 view (2 of 2)]
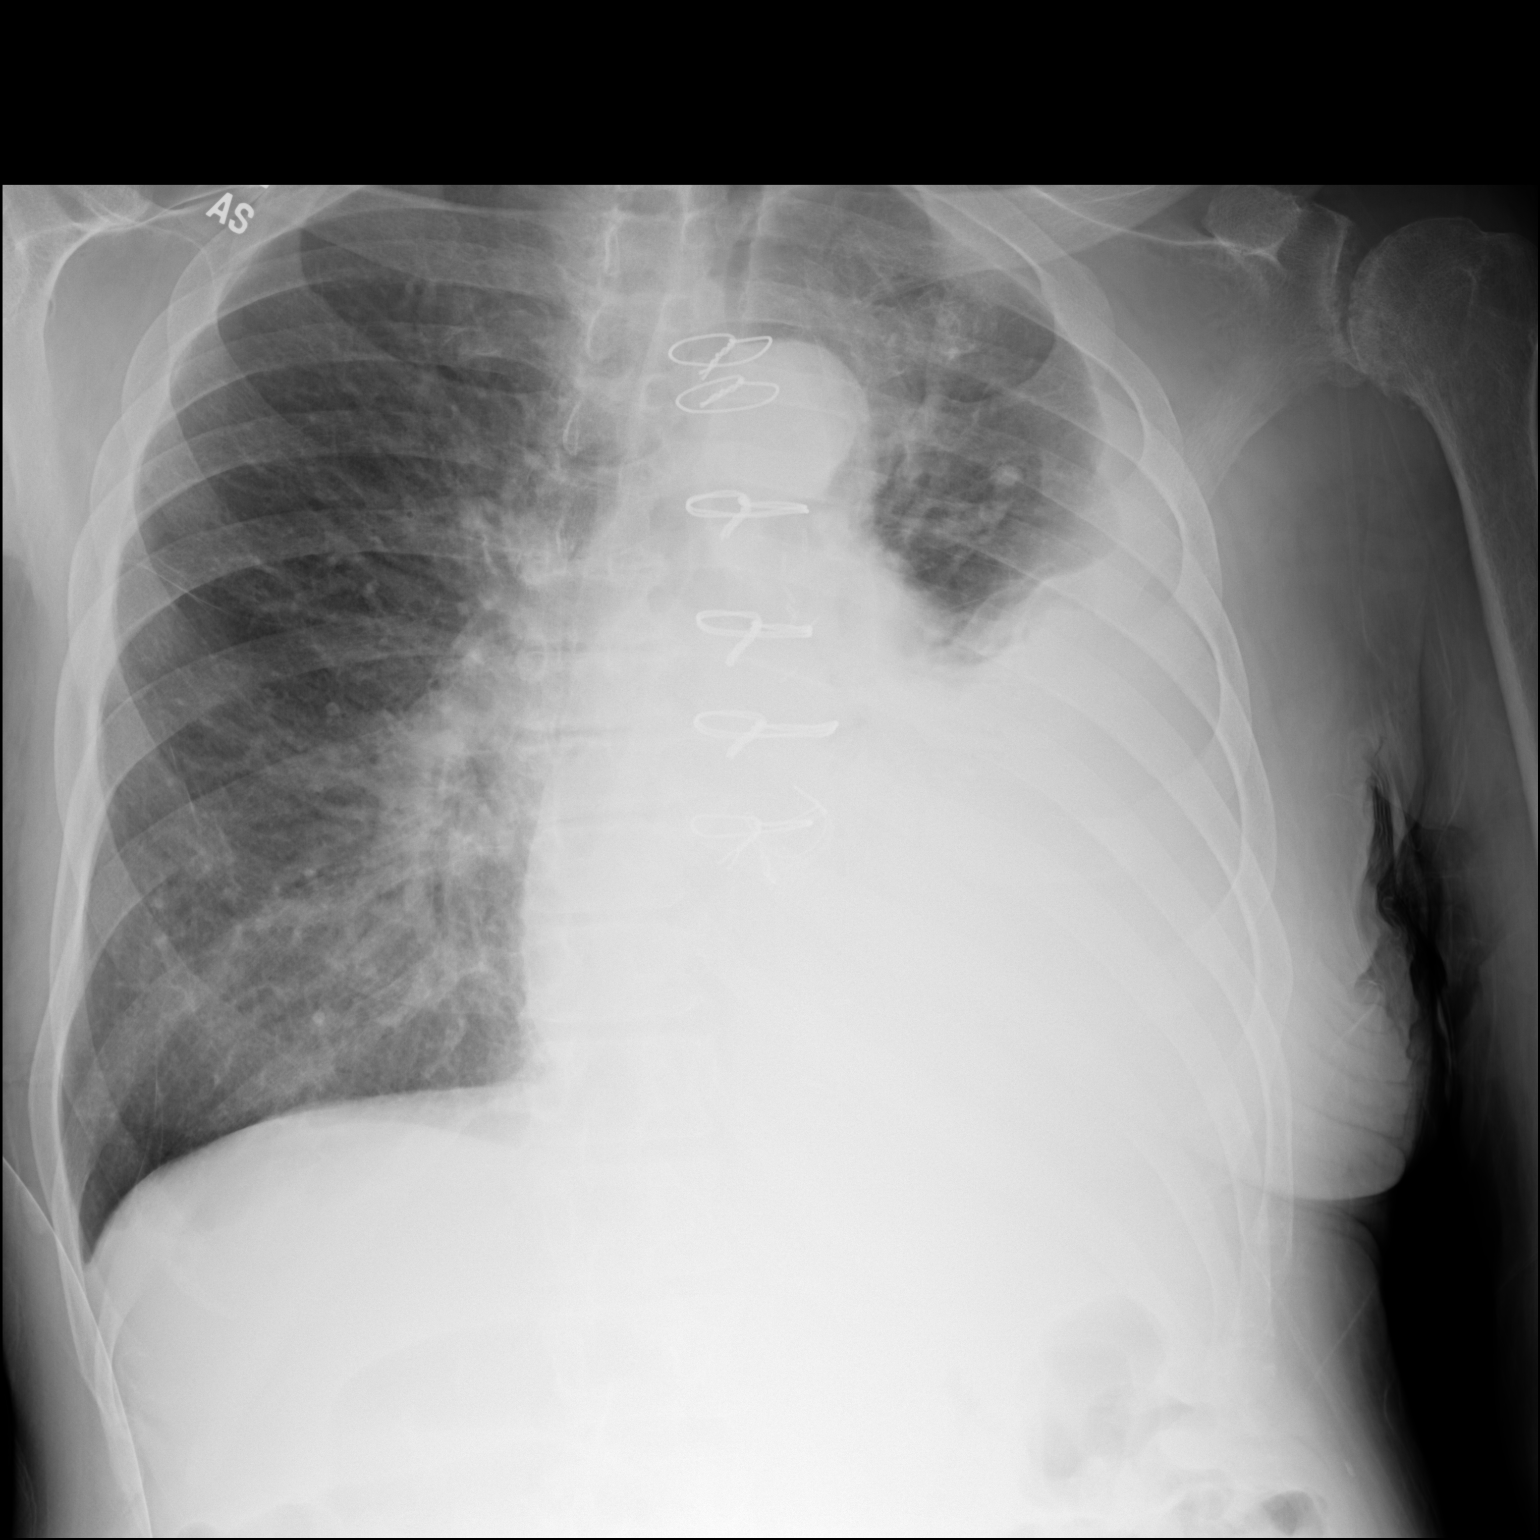

[2 of 2 positions shown; findings below may reference images not displayed]

FINDINGS: Prior median sternotomy and aortic valve replacement. Cardiomegaly
with diffuse bilateral pulmonary infiltrates/edema. Findings most
consistent CHF. Bilateral pneumonia can not be excluded. Progressive
large left-sided pleural effusion. No pneumothorax.
IMPRESSION: 1. Prior median sternotomy and aortic valve replacement.
Cardiomegaly with diffuse bilateral pulmonary infiltrates/edema.
Findings most consistent CHF. Bilateral pneumonia can not be
excluded.

2.  Progressive large left pleural effusion.

## 2020-04-26 IMAGING — CR DG CHEST 2V
2 series · 2 of 2 positions shown · non-contrast
Comparison: 10/25/2018

CLINICAL DATA: Recurrent left pleural effusion.

EXAM:
CHEST - 2 VIEW

[w chest pa]
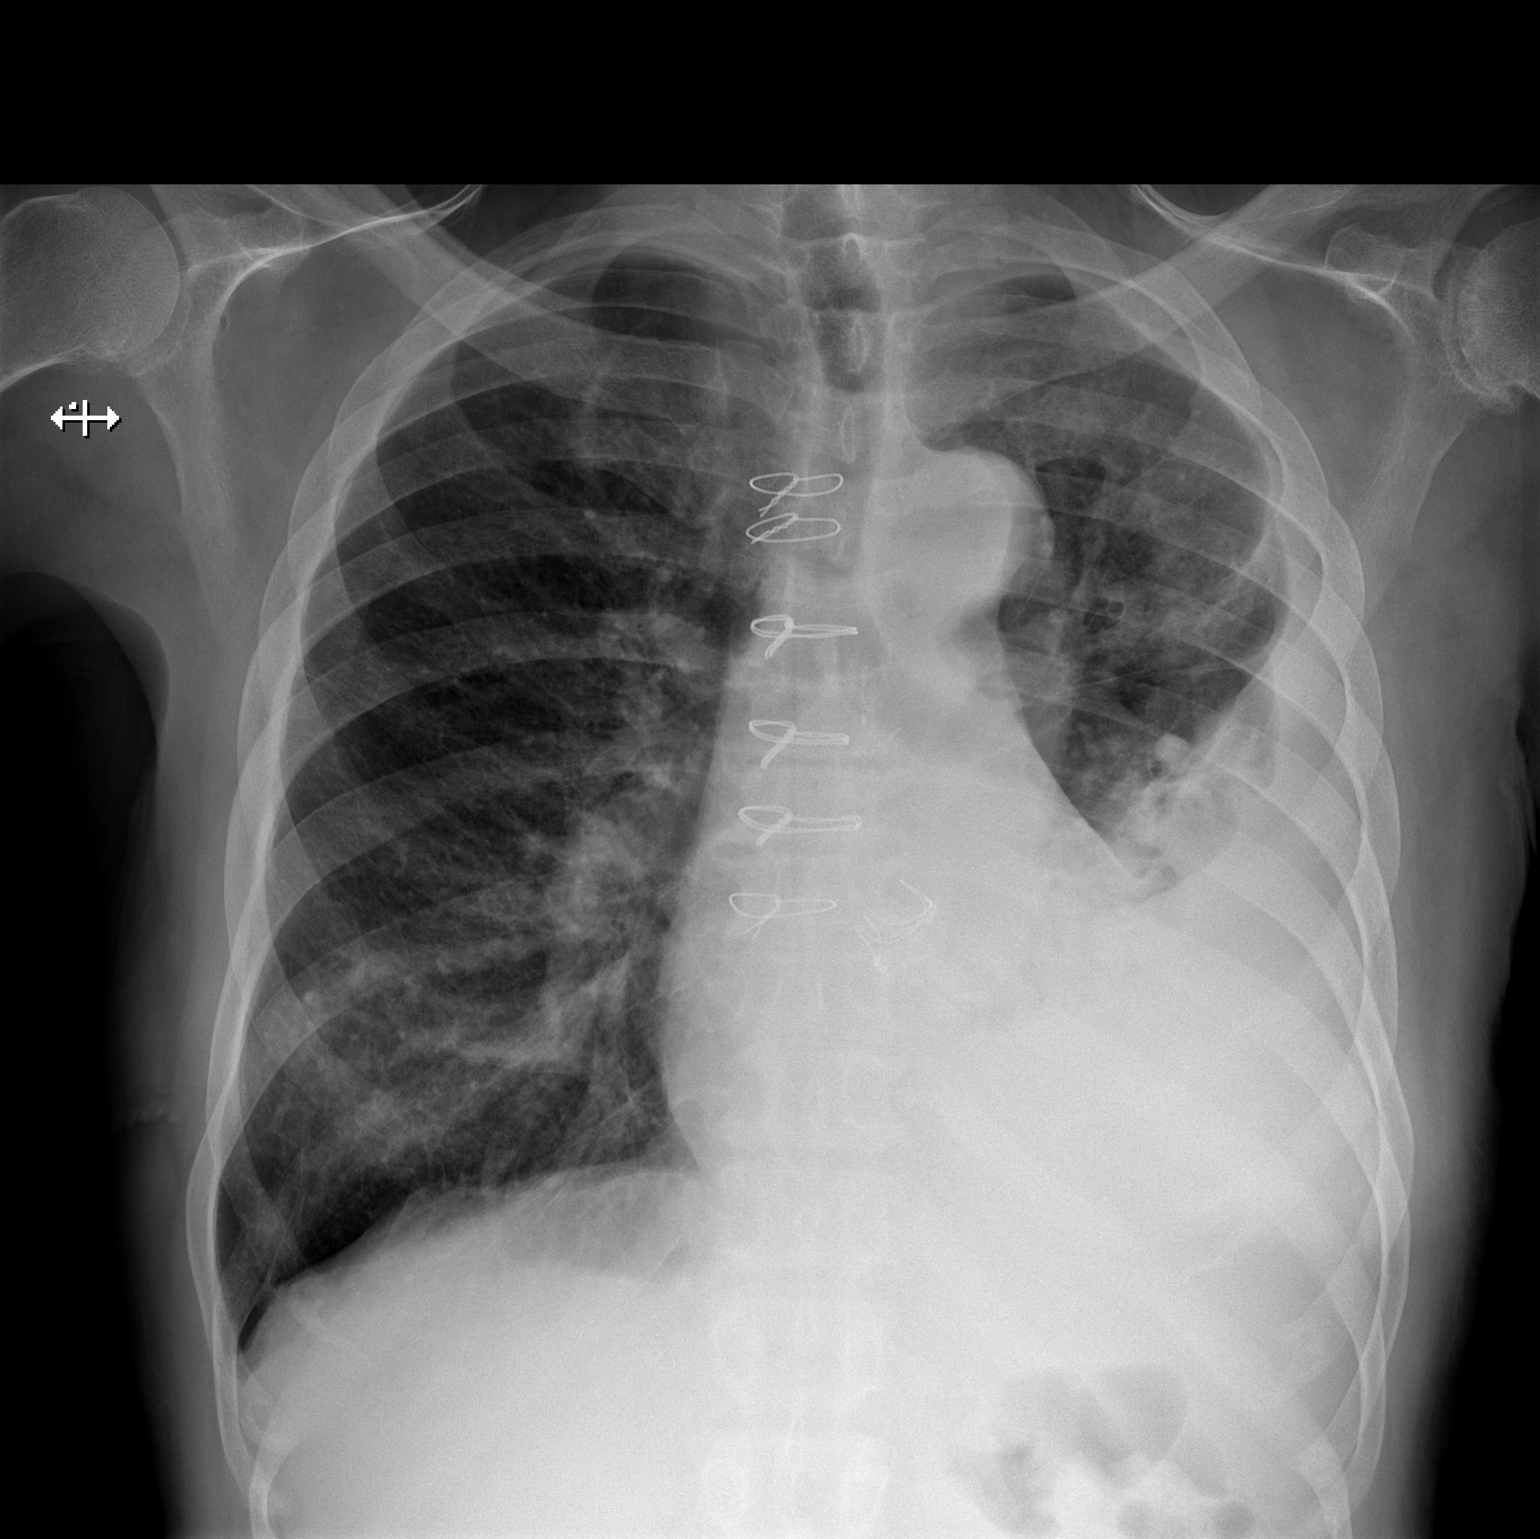

[w chest lat]
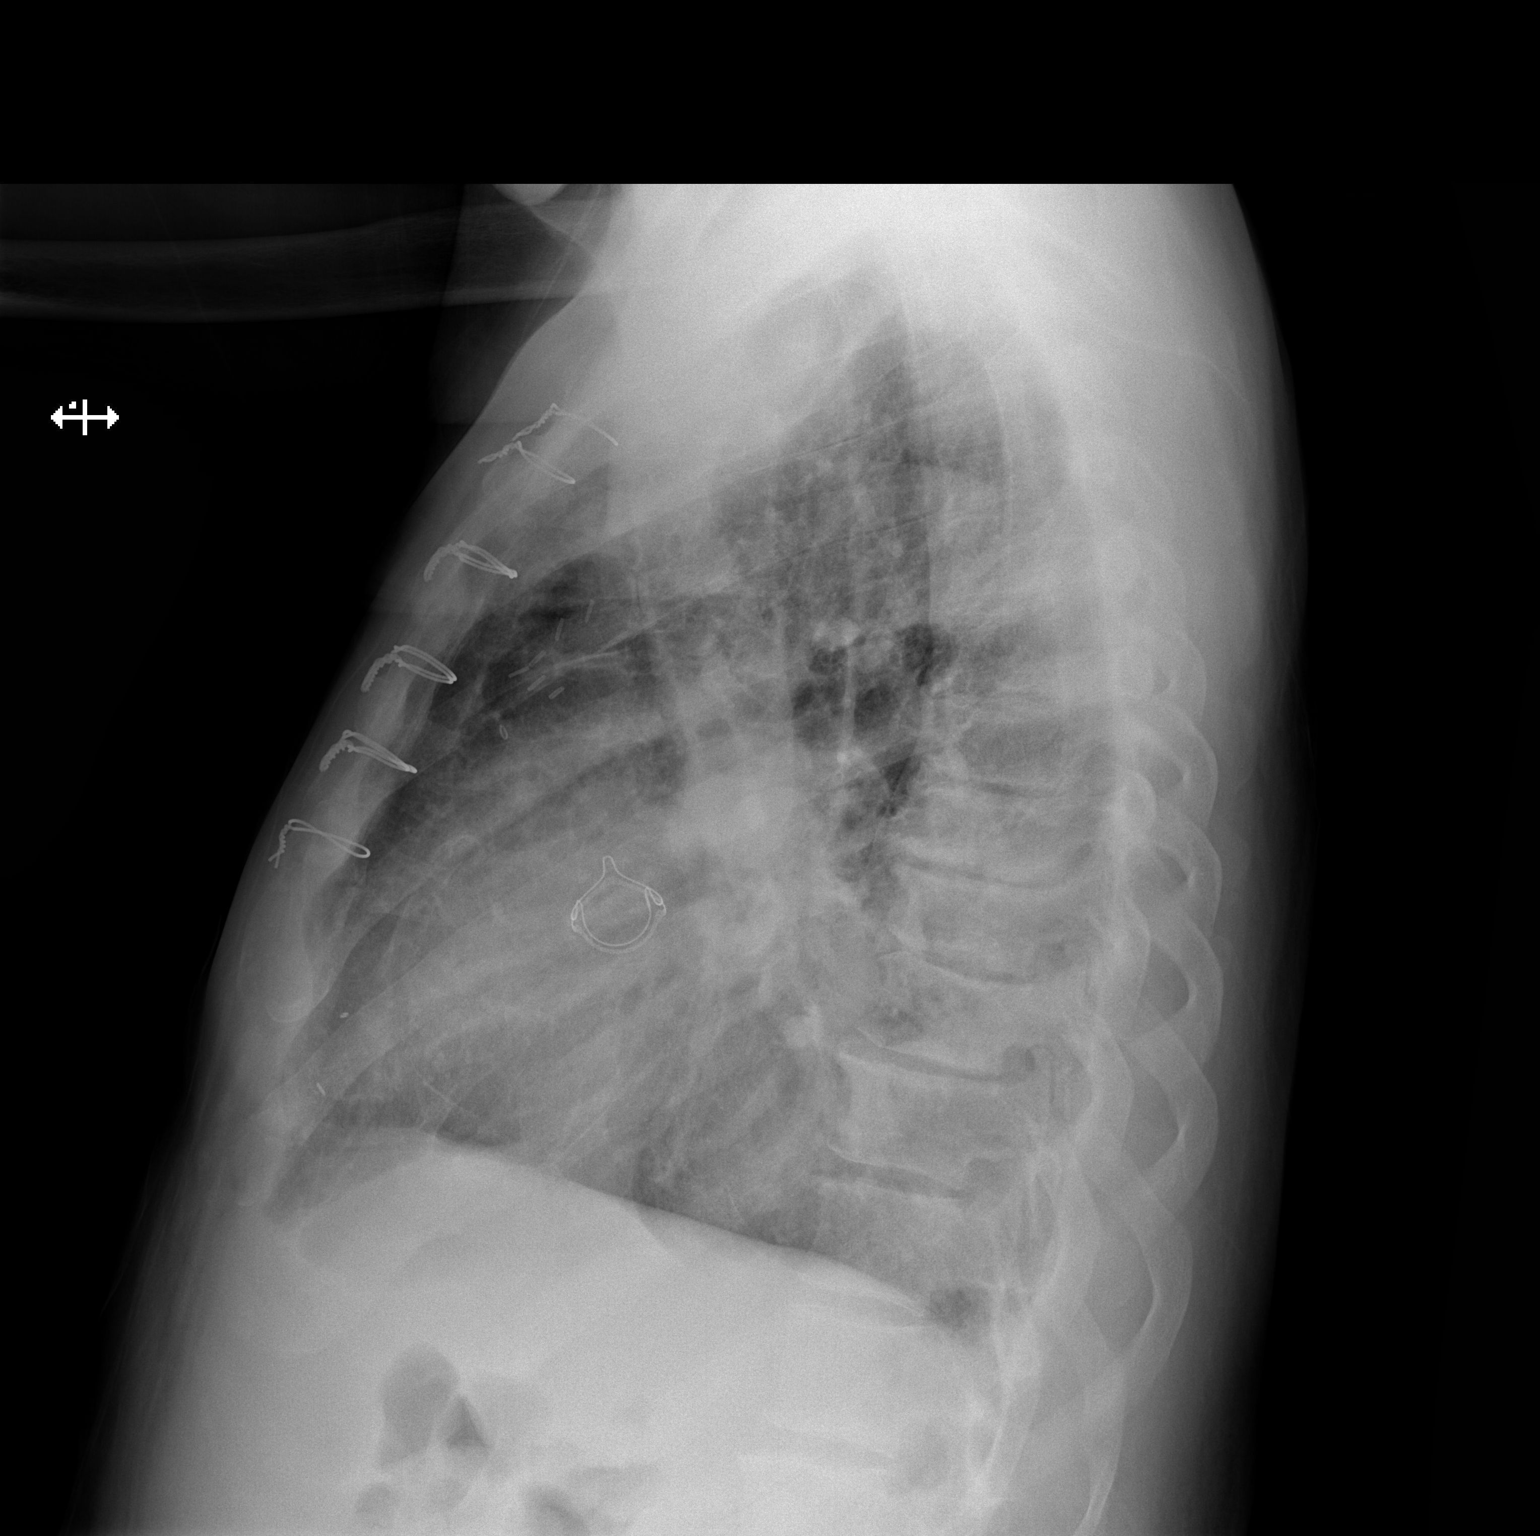

[2 of 2 positions shown; findings below may reference images not displayed]

FINDINGS: The cardiac silhouette, mediastinal and hilar contours appear normal
and stable. There is mild tortuosity of the thoracic aorta. Stable
surgical changes from aortic valve replacement.

Moderate to large left pleural effusion slightly less than on the
prior chest film. Persistent overlying atelectasis. No right-sided
pleural effusion. Streaky right basilar atelectasis. No
pneumothorax. The bony thorax is intact.
IMPRESSION: Moderate to large left pleural effusion slightly less than on the
prior chest film. Persistent overlying atelectasis.

Streaky right basilar atelectasis.

## 2020-04-28 IMAGING — DX DG CHEST 1V PORT
1 series · 1 of 1 positions shown · non-contrast
Comparison: Portable exam 4444 hours compared to 11/02/2018

CLINICAL DATA: Post VAT S, LEFT lung decortication, drainage of
LEFT pleural effusion and Drey Jim of LEFT chest tube

EXAM:
PORTABLE CHEST 1 VIEW

[chest ap]
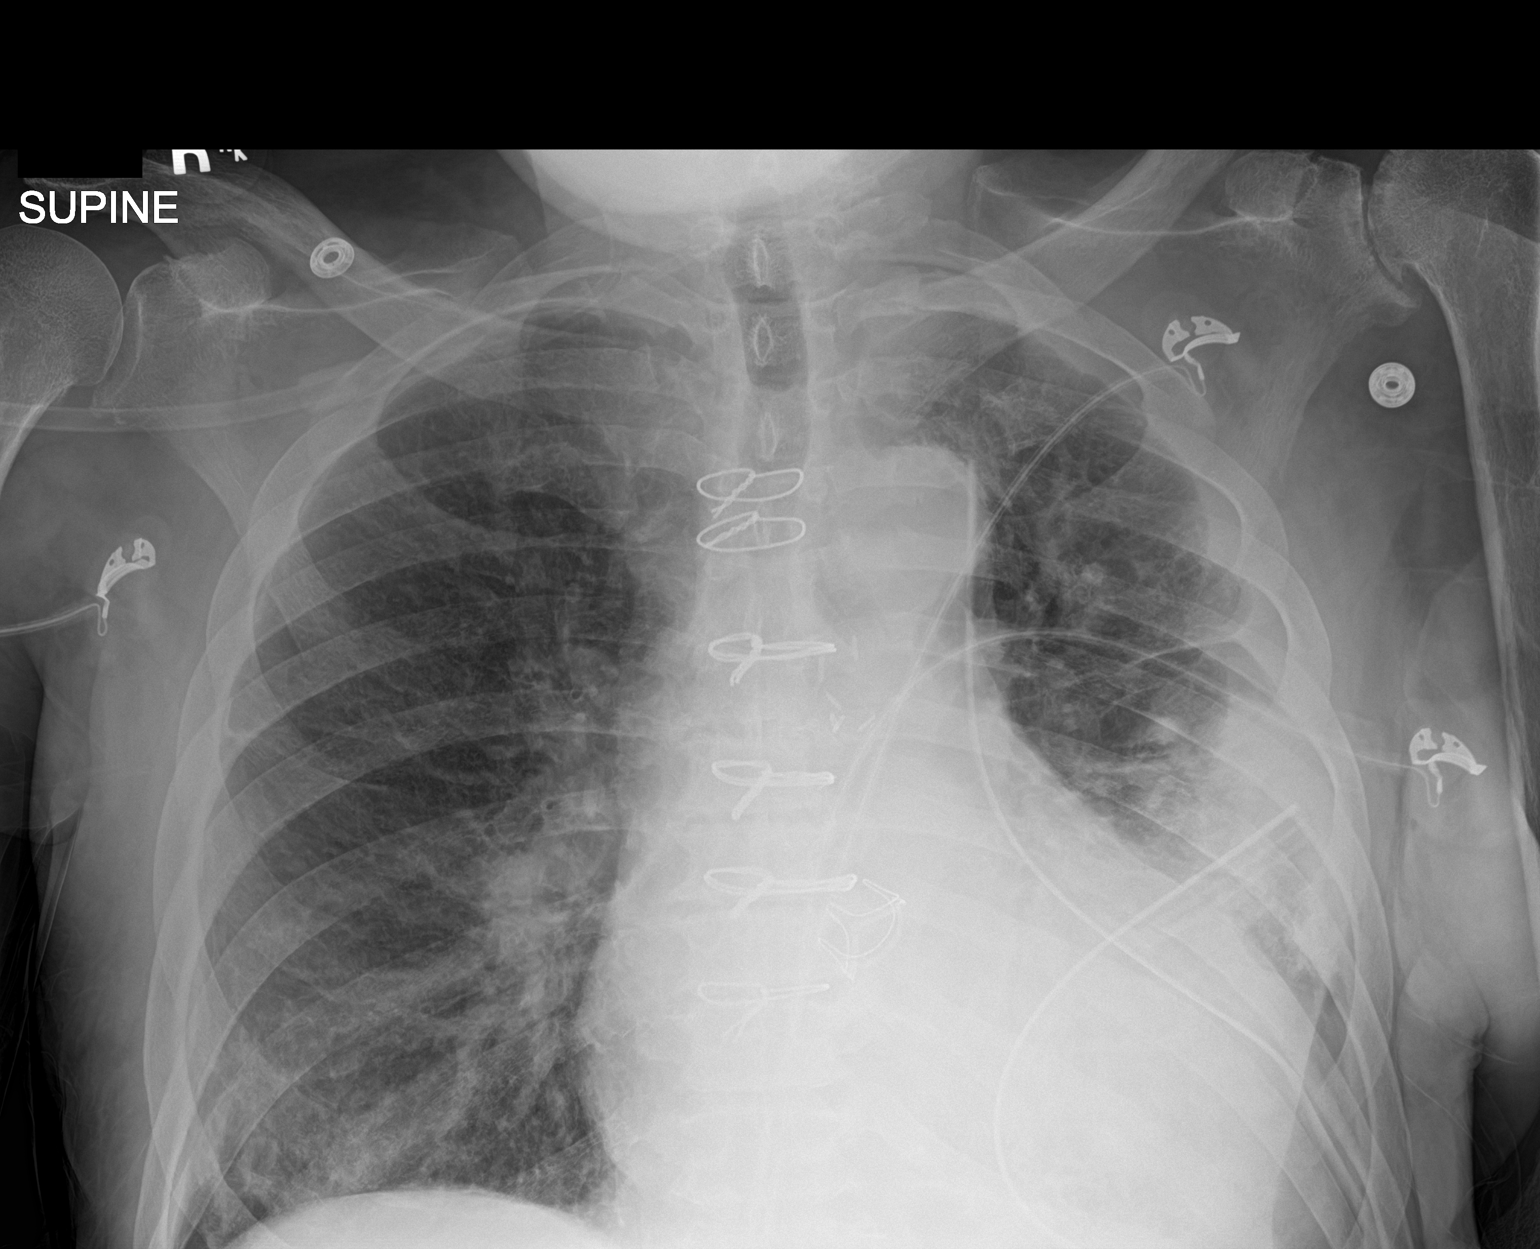

[1 of 1 positions shown; findings below may reference images not displayed]

FINDINGS: Pair of LEFT thoracostomy tubes now identified.

Enlargement of cardiac silhouette post median sternotomy and AVR.

Mild interstitial infiltrates question pulmonary edema.

Decreased LEFT pleural effusion and atelectasis since previous
study.

No pneumothorax.

Degenerative changes LEFT shoulder.
IMPRESSION: Decrease in LEFT pleural effusion and basilar atelectasis post
surgery and chest tube placement.

Enlargement of cardiac silhouette post AVR with question minimal
pulmonary edema.

## 2020-04-29 IMAGING — DX DG CHEST 1V PORT
1 series · 1 of 1 positions shown · non-contrast
Comparison: 11/04/2018 and 11/02/2018.

CLINICAL DATA: Postop left thoracotomy and pleural decortication.

EXAM:
PORTABLE CHEST 1 VIEW

[chest]
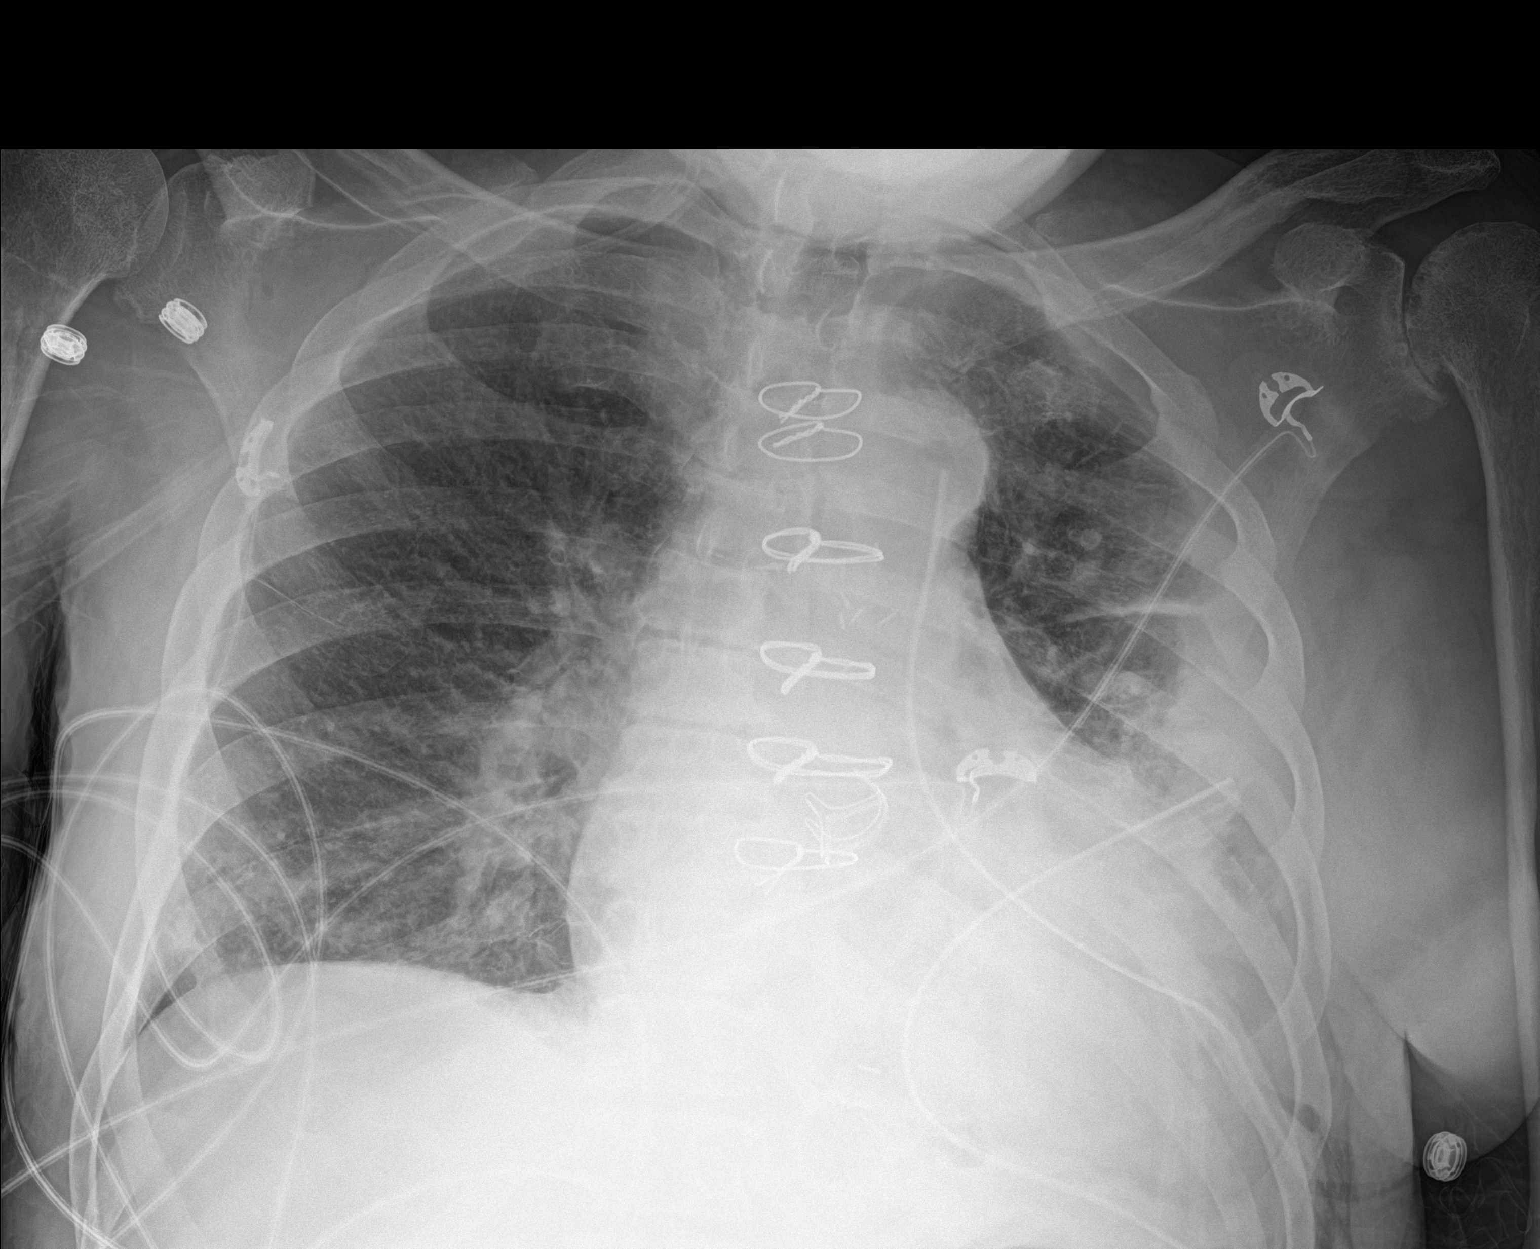

[1 of 1 positions shown; findings below may reference images not displayed]

FINDINGS: 2442 hours. Two left-sided chest tubes remain in place. The volume
of residual pleural fluid/thickening on the left is unchanged. There
is stable left basilar pulmonary opacity. The right lung is clear.
The heart size and mediastinal contours are stable status post
median sternotomy and aortic valve replacement. No pneumothorax.
IMPRESSION: Stable postoperative chest.

## 2020-04-30 IMAGING — DX DG CHEST 1V PORT
2 series · 2 of 2 positions shown · non-contrast
Comparison: 11/05/2018 and earlier.

CLINICAL DATA: Postop day 2 LEFT VATS for decortication of empyema.

EXAM:
PORTABLE CHEST 1 VIEW

[chest ap (1 of 2)]
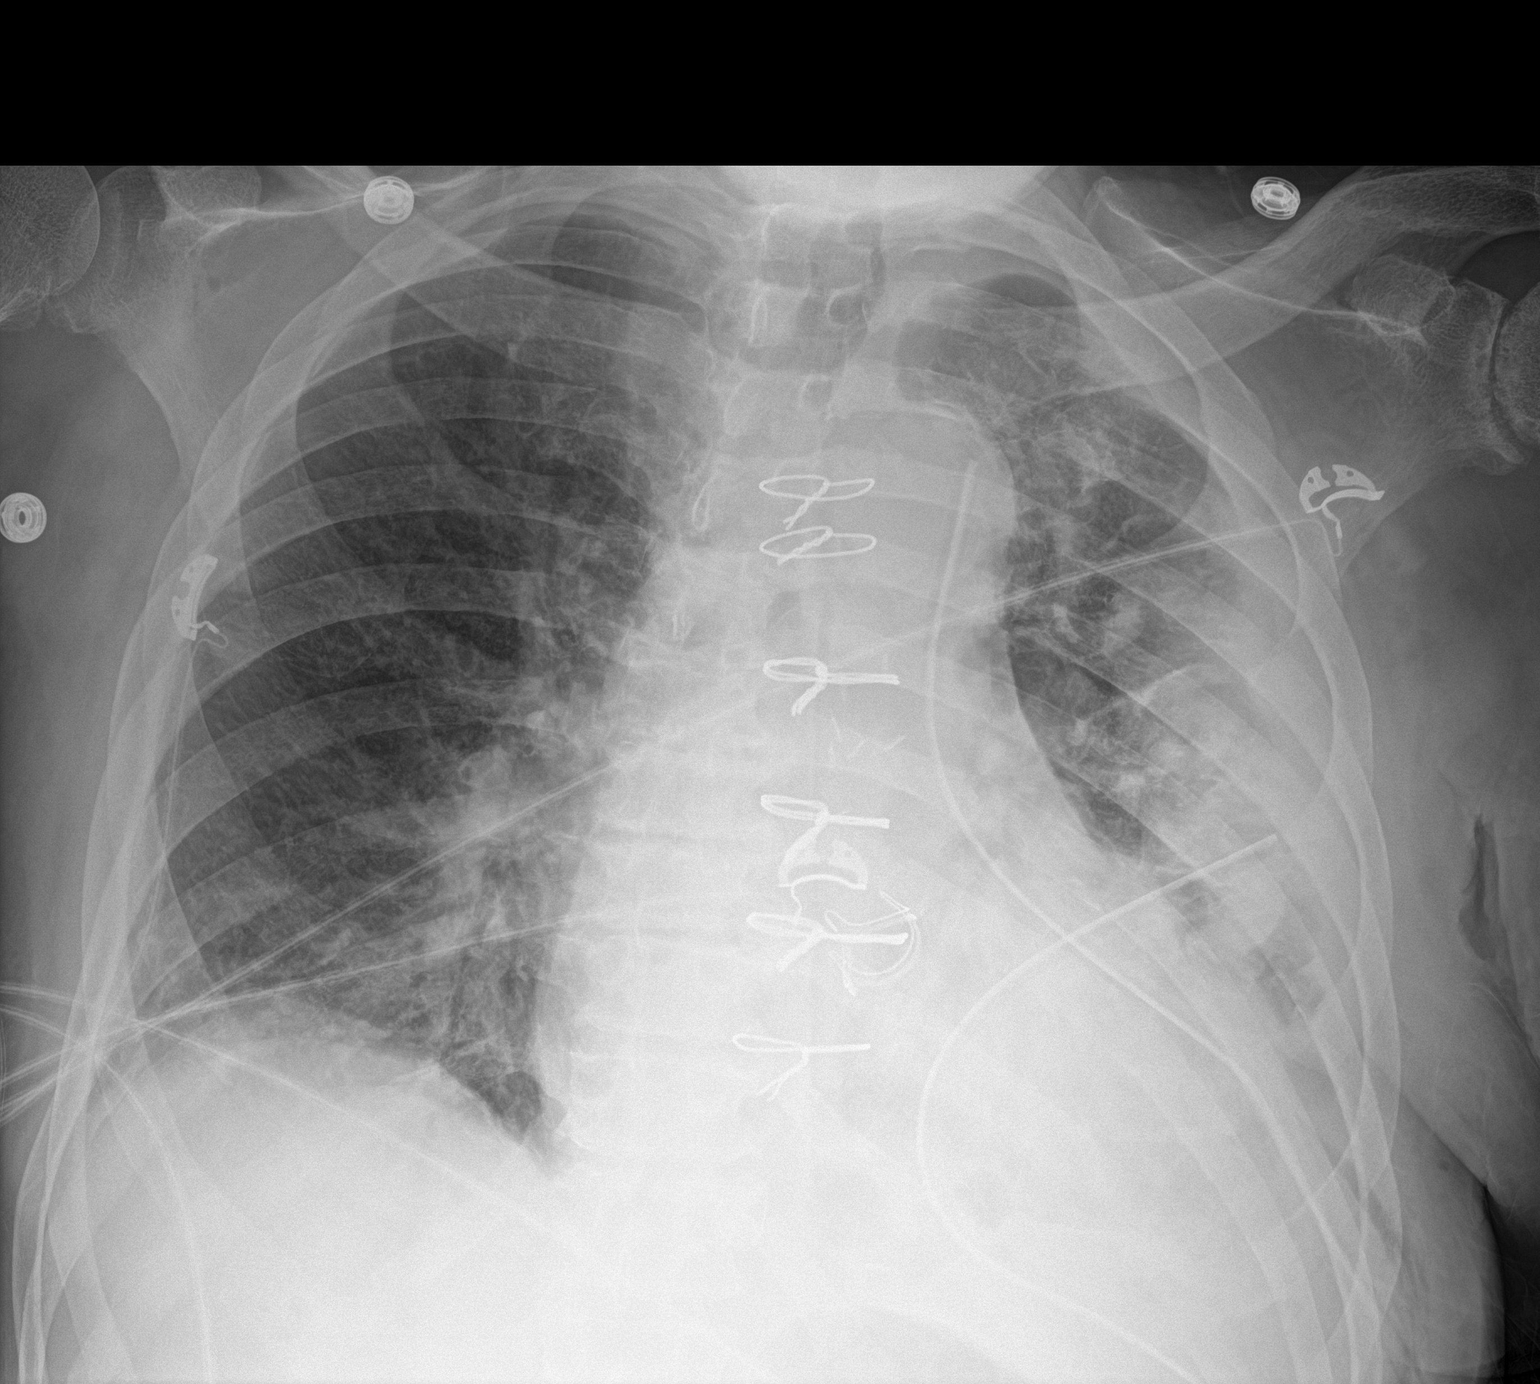

[chest ap (2 of 2)]
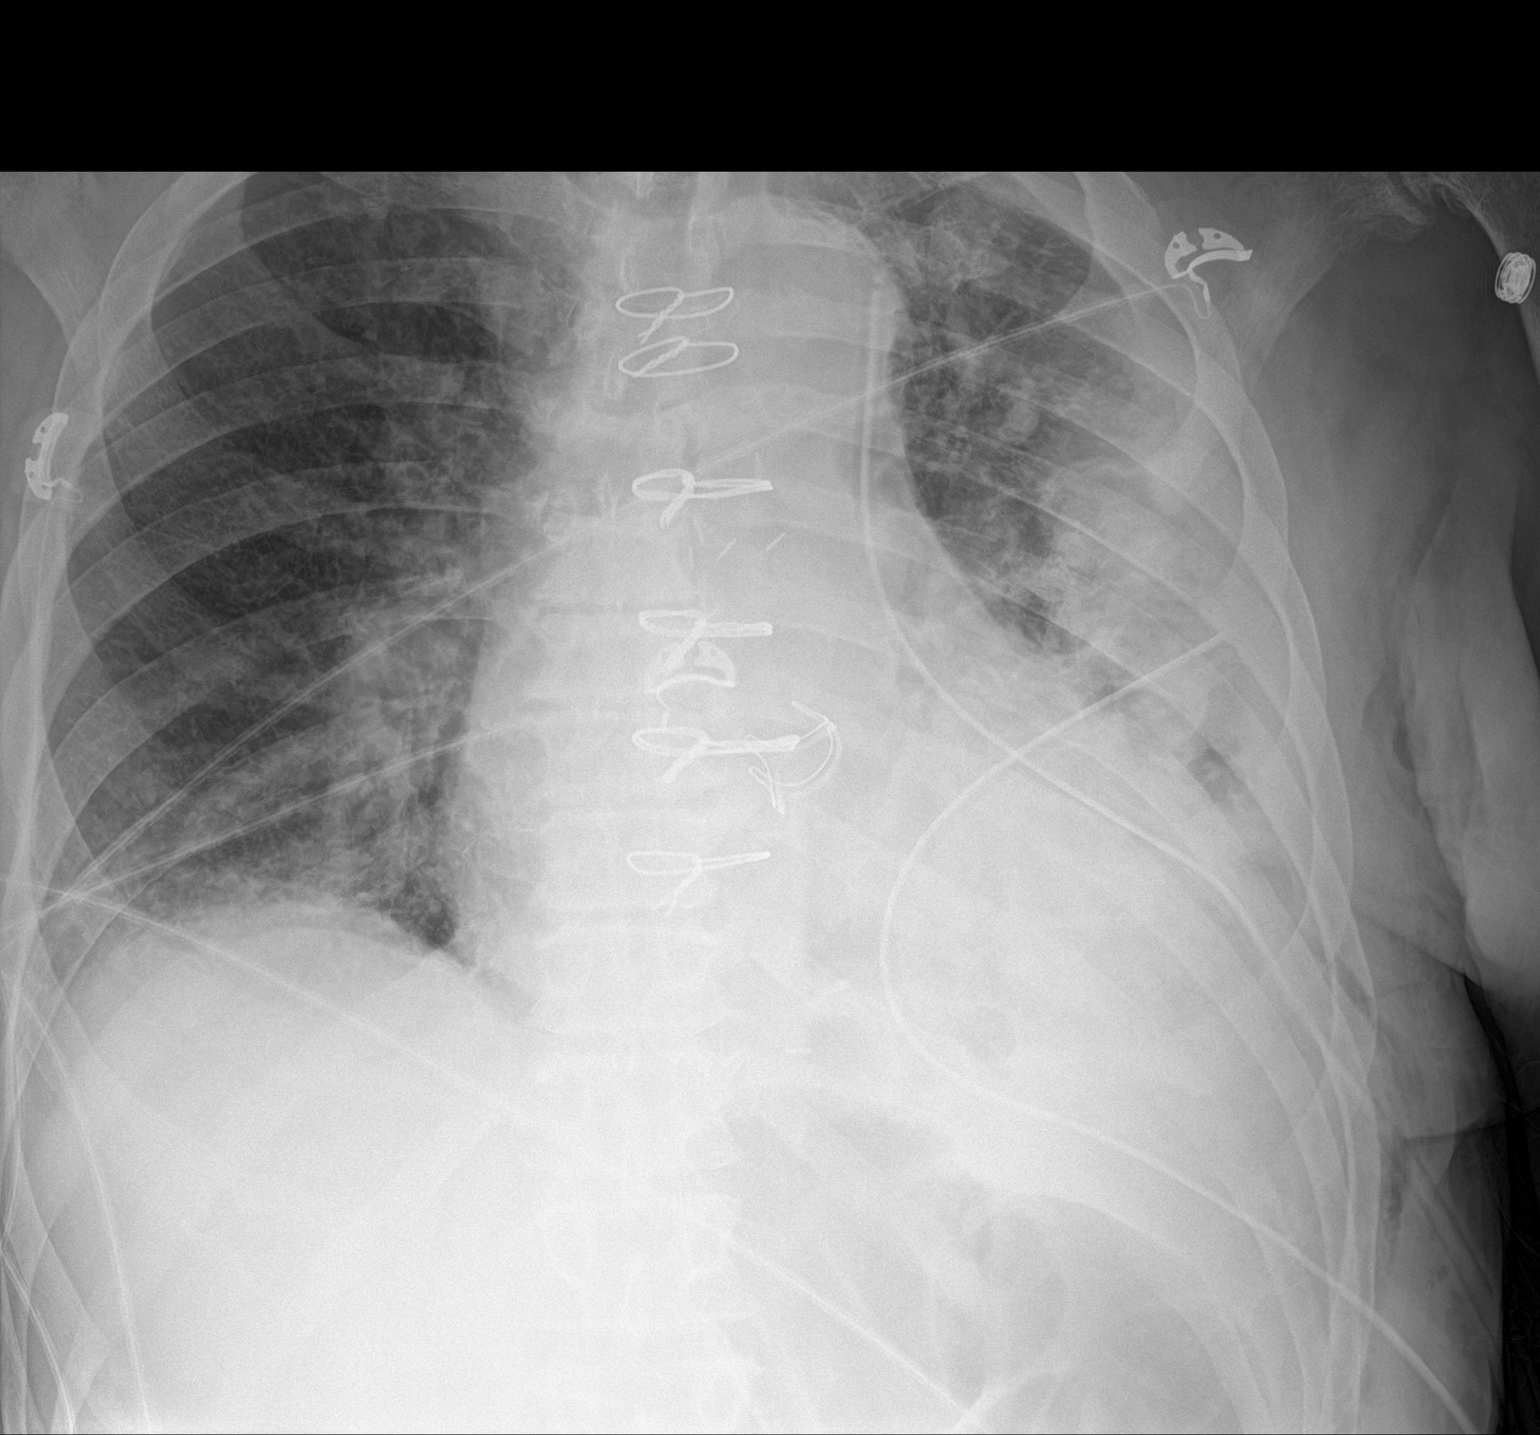

[2 of 2 positions shown; findings below may reference images not displayed]

FINDINGS: Two LEFT chest tubes remain in place with no change in the residual
loculated LEFT pleural effusion and/or pleural thickening. Stable
airspace consolidation in the LEFT LOWER LOBE and linear atelectasis
in the LEFT UPPER LOBE. Pulmonary vascularity normal. No new
pulmonary parenchymal abnormalities. Prior sternotomy for aortic
valve replacement. Cardiac silhouette moderately enlarged,
unchanged.
IMPRESSION: 1. Stable residual loculated LEFT pleural effusion and/or pleural
thickening with LEFT chest tubes in place.
2. Stable LEFT LOWER LOBE atelectasis and/or pneumonia and linear
atelectasis in the LEFT UPPER LOBE.
3. No new abnormalities.

## 2020-05-01 IMAGING — DX DG CHEST 1V PORT
1 series · 1 of 1 positions shown · non-contrast
Comparison: 11/06/2018

CLINICAL DATA: Chest tube

EXAM:
PORTABLE CHEST 1 VIEW

[chest]
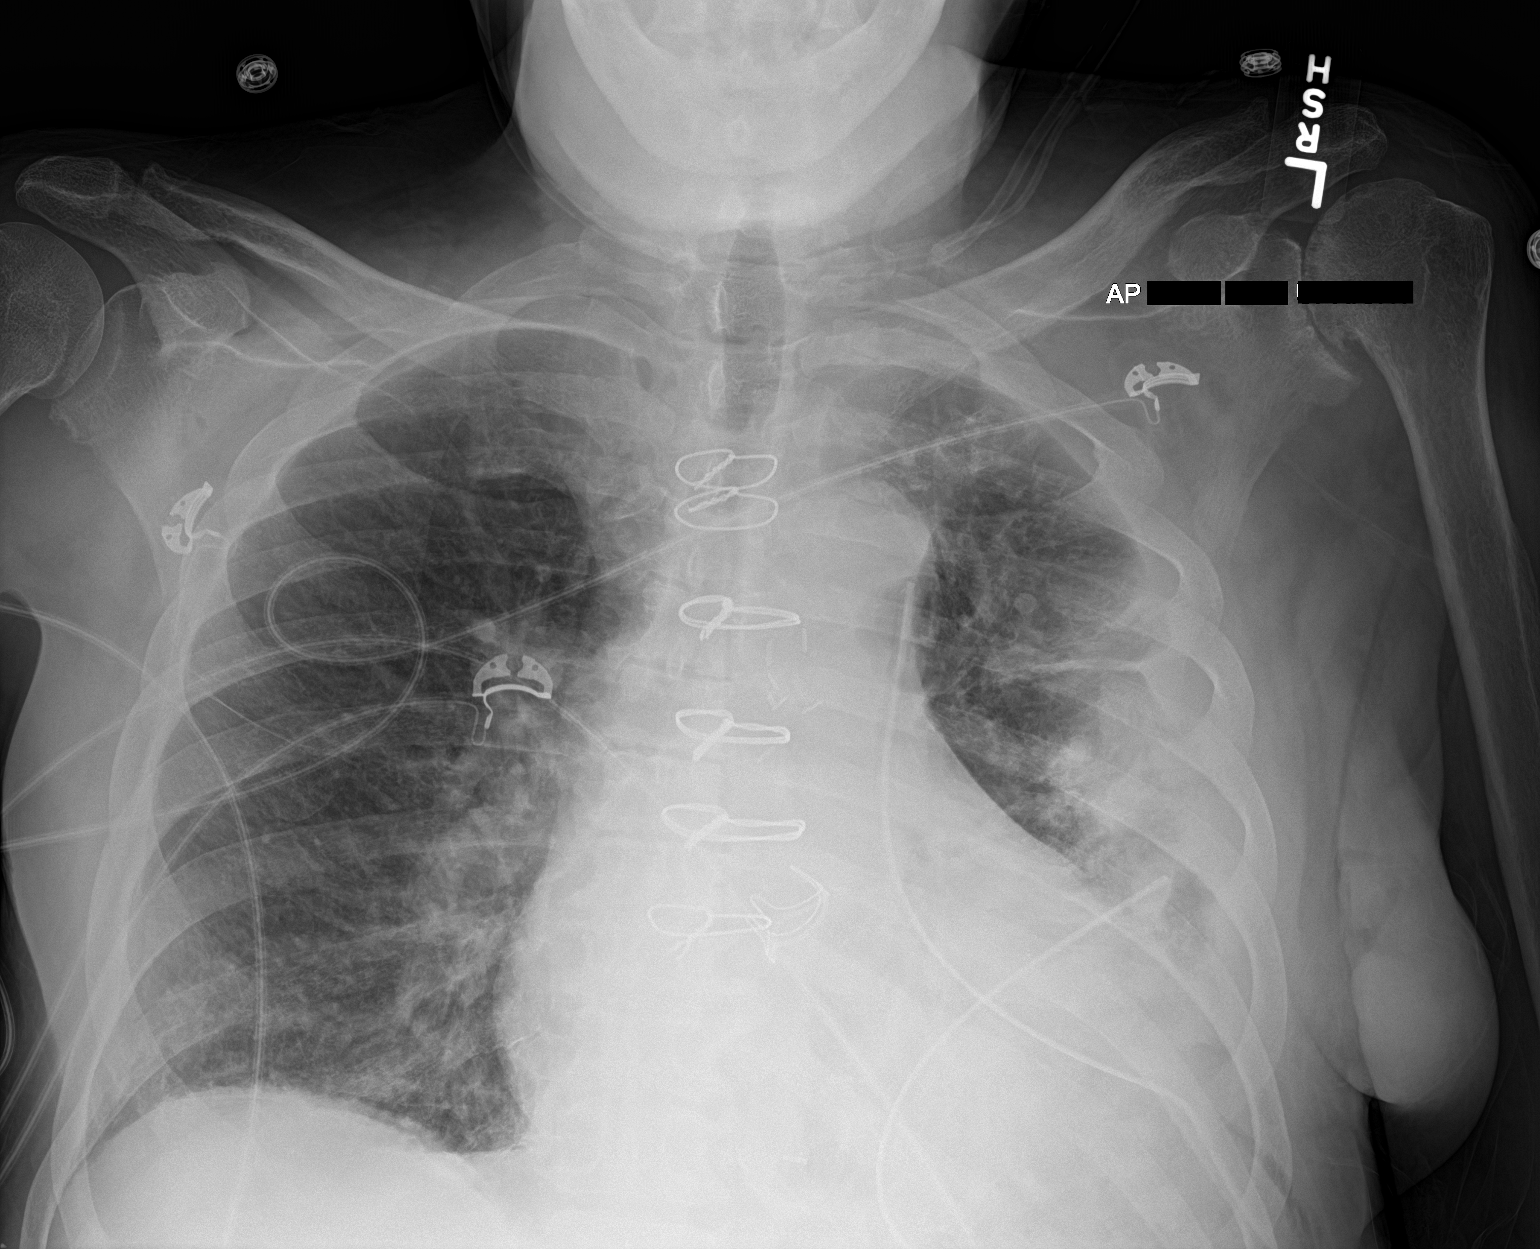

[1 of 1 positions shown; findings below may reference images not displayed]

FINDINGS: Left chest tubes are stable. Pleural and parenchymal opacities
throughout the left lung are stable. There is no pneumothorax.
Subsegmental atelectasis at the right base is stable. Aortic valve
replacement hardware is in place. Upper normal heart size. Vascular
congestion.
IMPRESSION: Stable left chest tubes without pneumothorax. Loculated left pleural
fluid persists with opacities in the left lung.

Stable vascular congestion.

Stable subsegmental atelectasis at the right lung base.

## 2020-05-02 IMAGING — DX DG CHEST 1V PORT
1 series · 1 of 1 positions shown · non-contrast
Comparison: 11/07/2018 at [DATE] a.m., and older exams.

CLINICAL DATA: Short of breath. Chest tube in place. Chest
discomfort.

EXAM:
PORTABLE CHEST 1 VIEW

[chest]
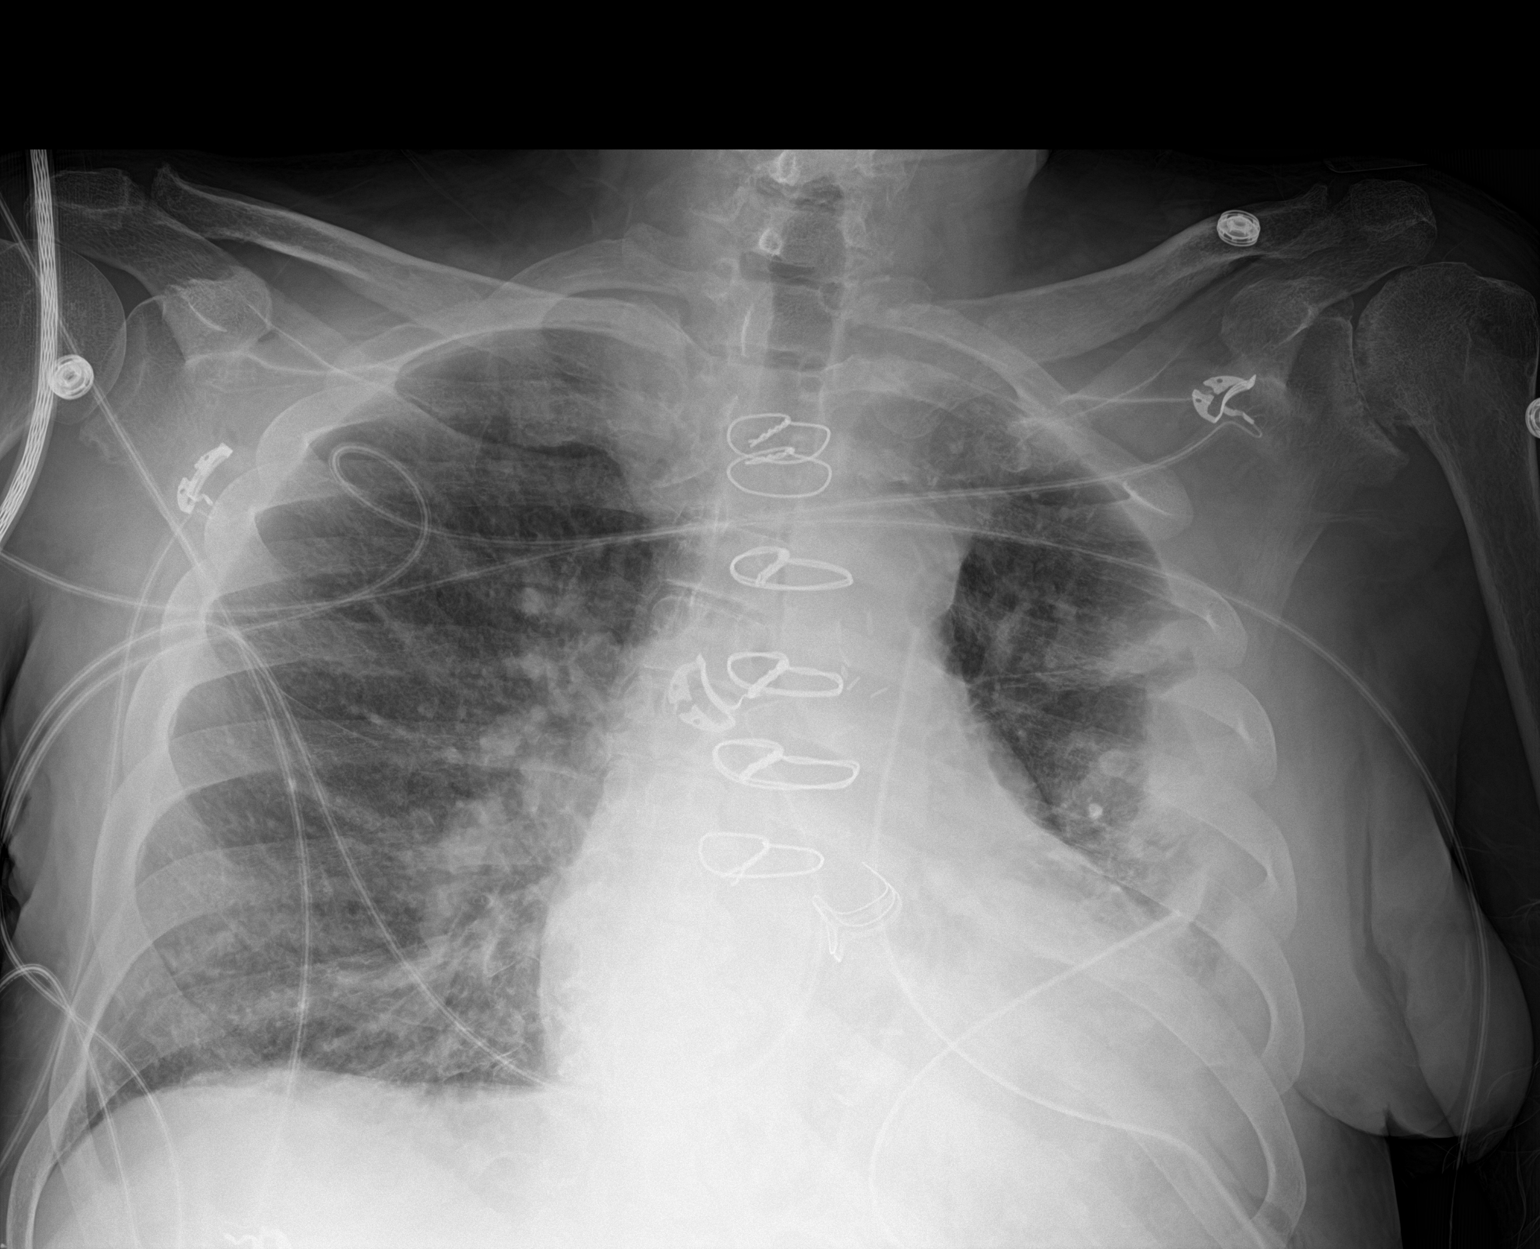

[1 of 1 positions shown; findings below may reference images not displayed]

FINDINGS: Two left-sided chest tubes are stable from the earlier exam. There
is patchy airspace opacity in the left peripheral mid lung with
confluent left lung base opacity, without change. Right lung is
hyperexpanded with prominent bronchovascular markings and mild
medial lung base streaky opacities, the latter finding consistent
with atelectasis.

No pneumothorax.

Stable changes from prior CT cardiac surgery.
IMPRESSION: 1. No significant change from the study obtained earlier this same
date.
2. Stable left chest tubes.  No pneumothorax.
3. Loculated left pleural effusion with stable parenchymal airspace
lung opacities in the left mid to lower lung. Mild right lung base
atelectasis.

## 2020-05-03 IMAGING — DX DG CHEST 1V PORT
2 series · 2 of 2 positions shown · non-contrast
Comparison: 11/09/2018, 11/08/2018, 11/07/2018

CLINICAL DATA: Chest tube removal

EXAM:
PORTABLE CHEST 1 VIEW

[chest ap (1 of 2)]
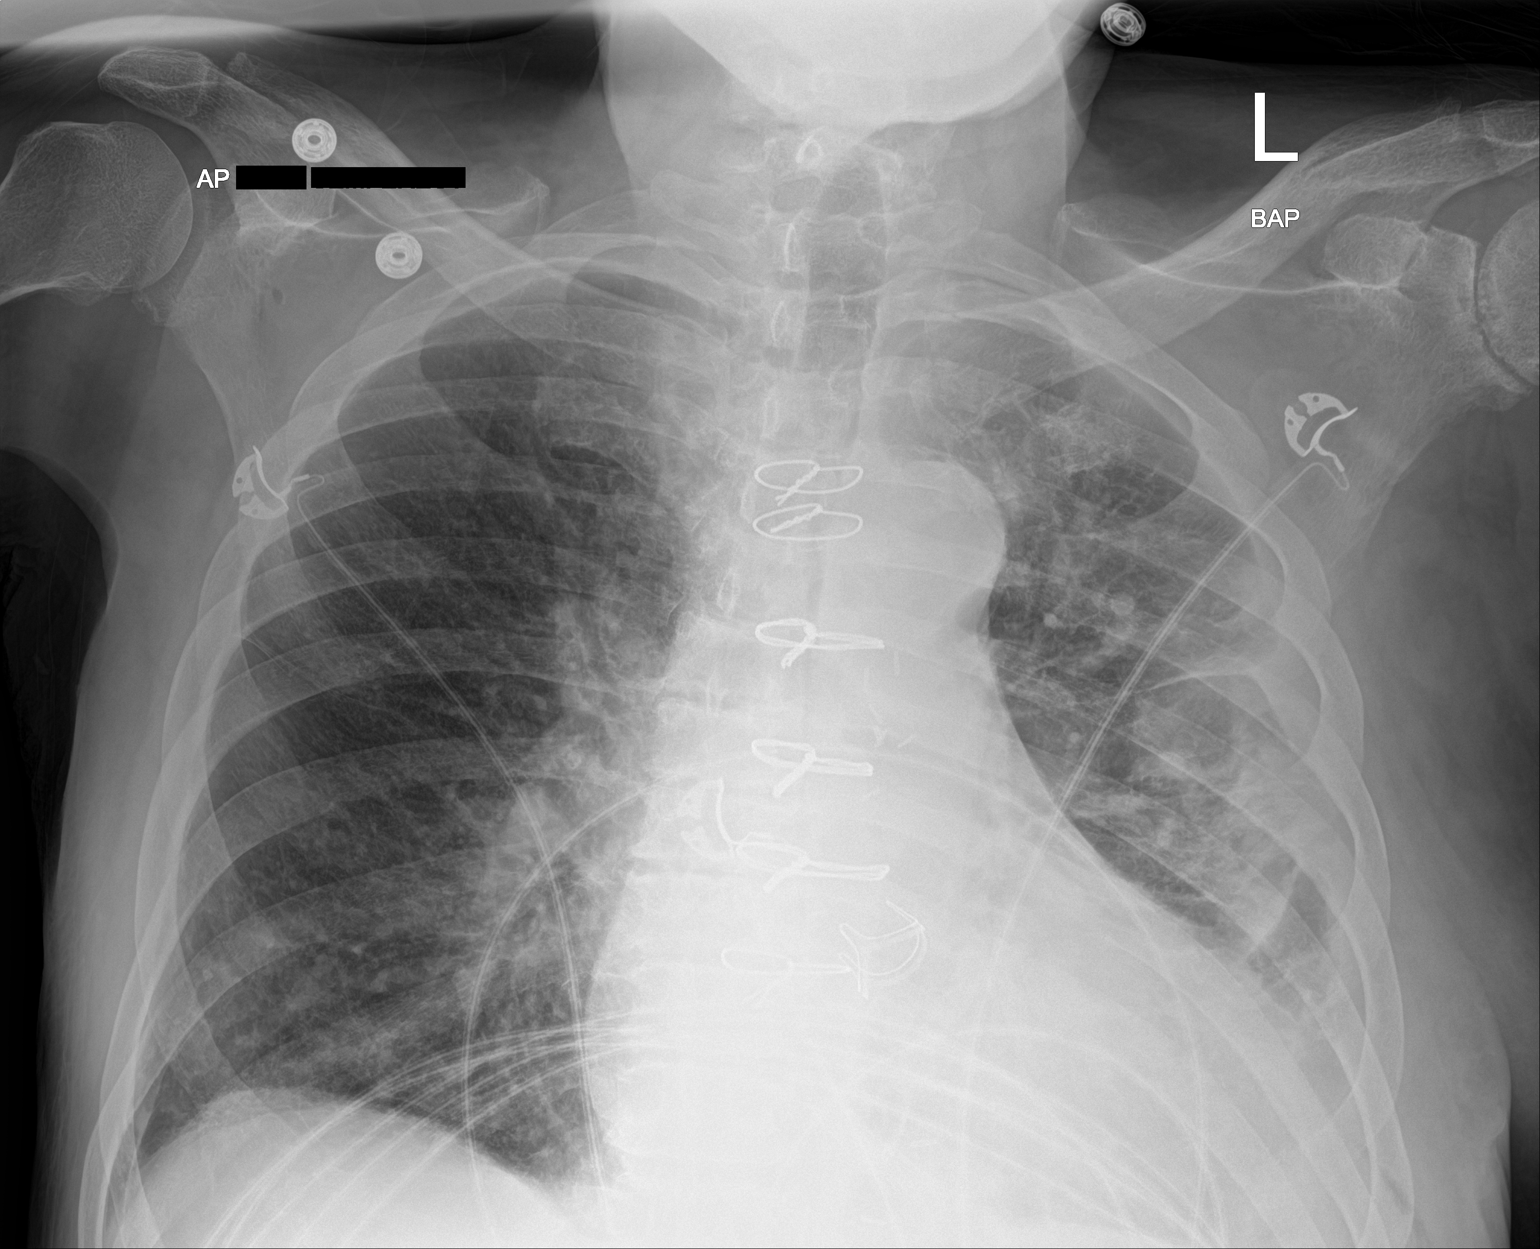

[chest ap (2 of 2)]
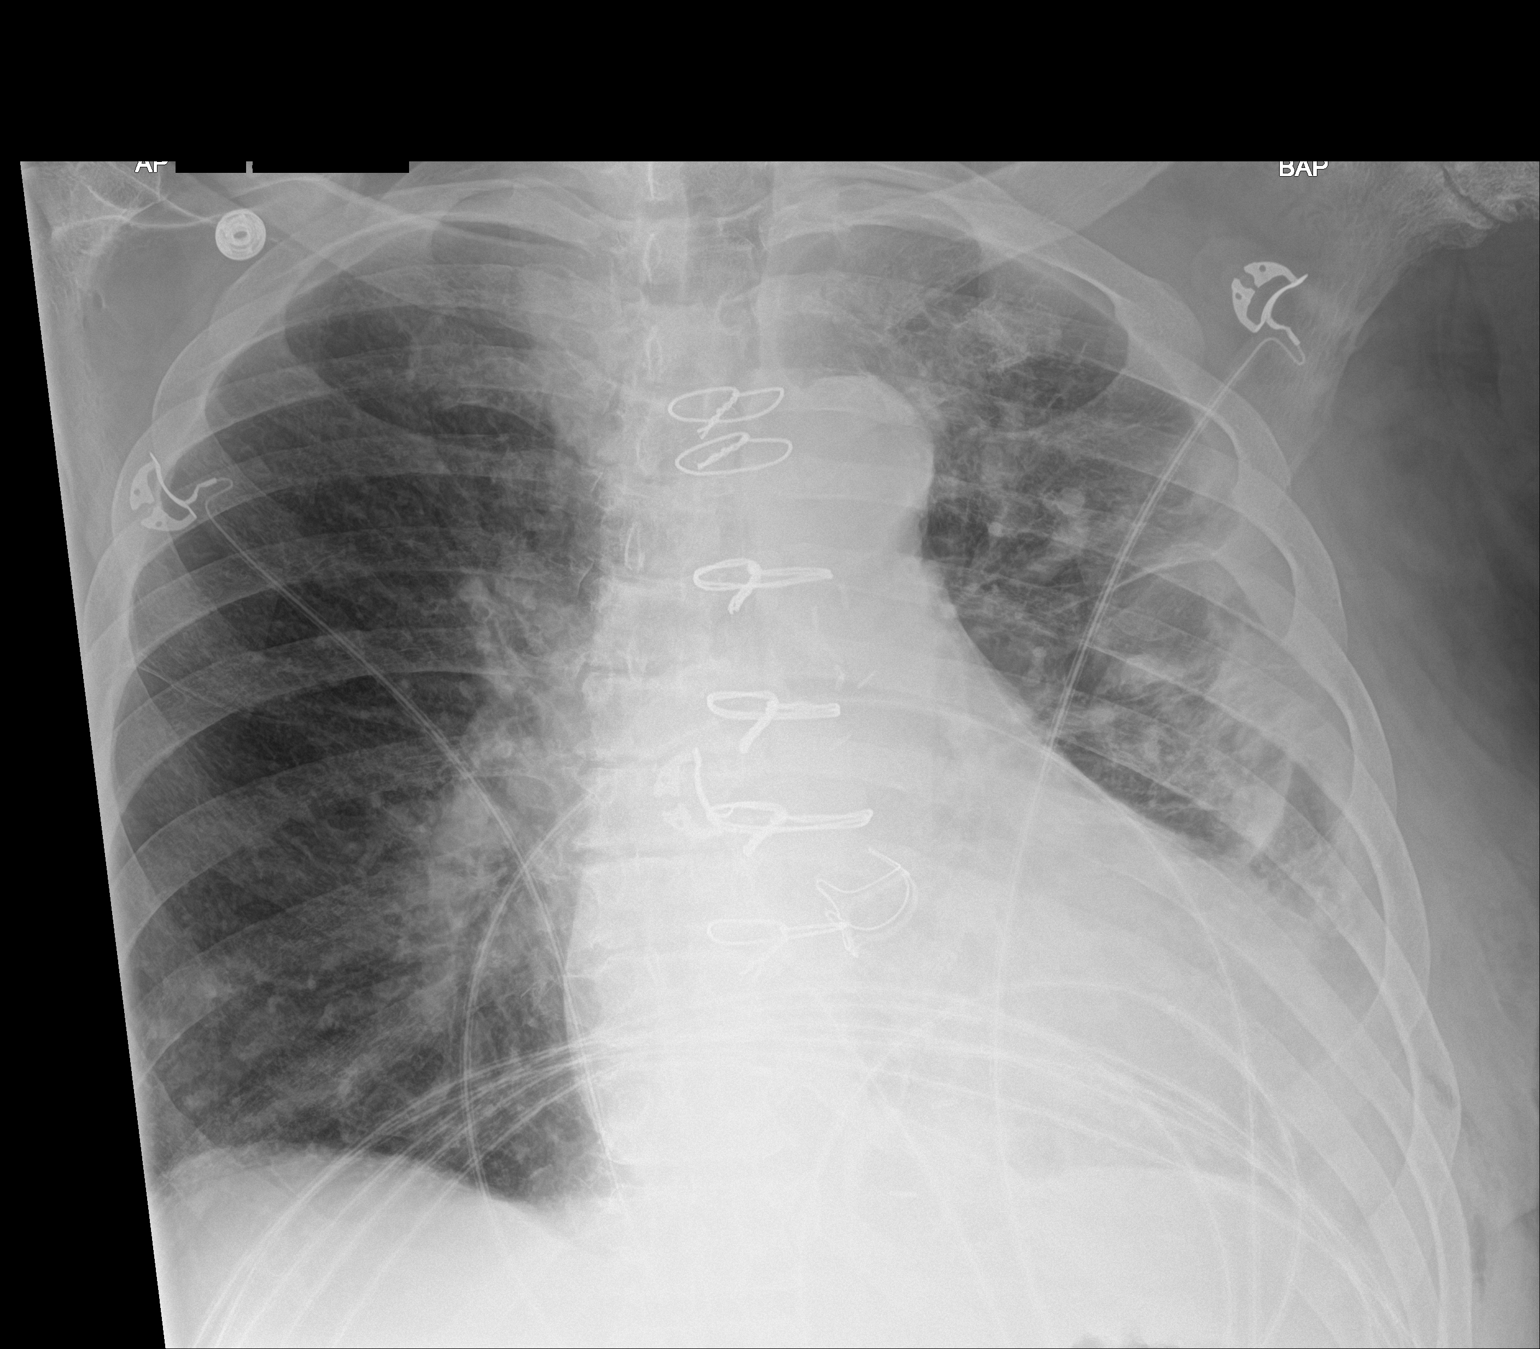

[2 of 2 positions shown; findings below may reference images not displayed]

FINDINGS: Post sternotomy changes and valve prosthesis. Patchy airspace
disease at the right base. Removal of left-sided chest tube. Similar
appearance of residual pleural and parenchymal disease in the left
mid to lower lung. Small amount of subcutaneous emphysema in the
left lower lateral chest wall. No pneumothorax. Mild cardiomegaly.
IMPRESSION: 1. Removal of left-sided chest tube.  No pneumothorax.
2. Similar appearance of pleural and parenchymal disease in the left
thorax with mild volume loss.
3. Mild cardiomegaly

## 2020-05-04 IMAGING — DX DG CHEST 2V
2 series · 2 of 2 positions shown · non-contrast
Comparison: 11/09/2018.

CLINICAL DATA: Pleural effusion.

EXAM:
CHEST - 2 VIEW

[chest ap]
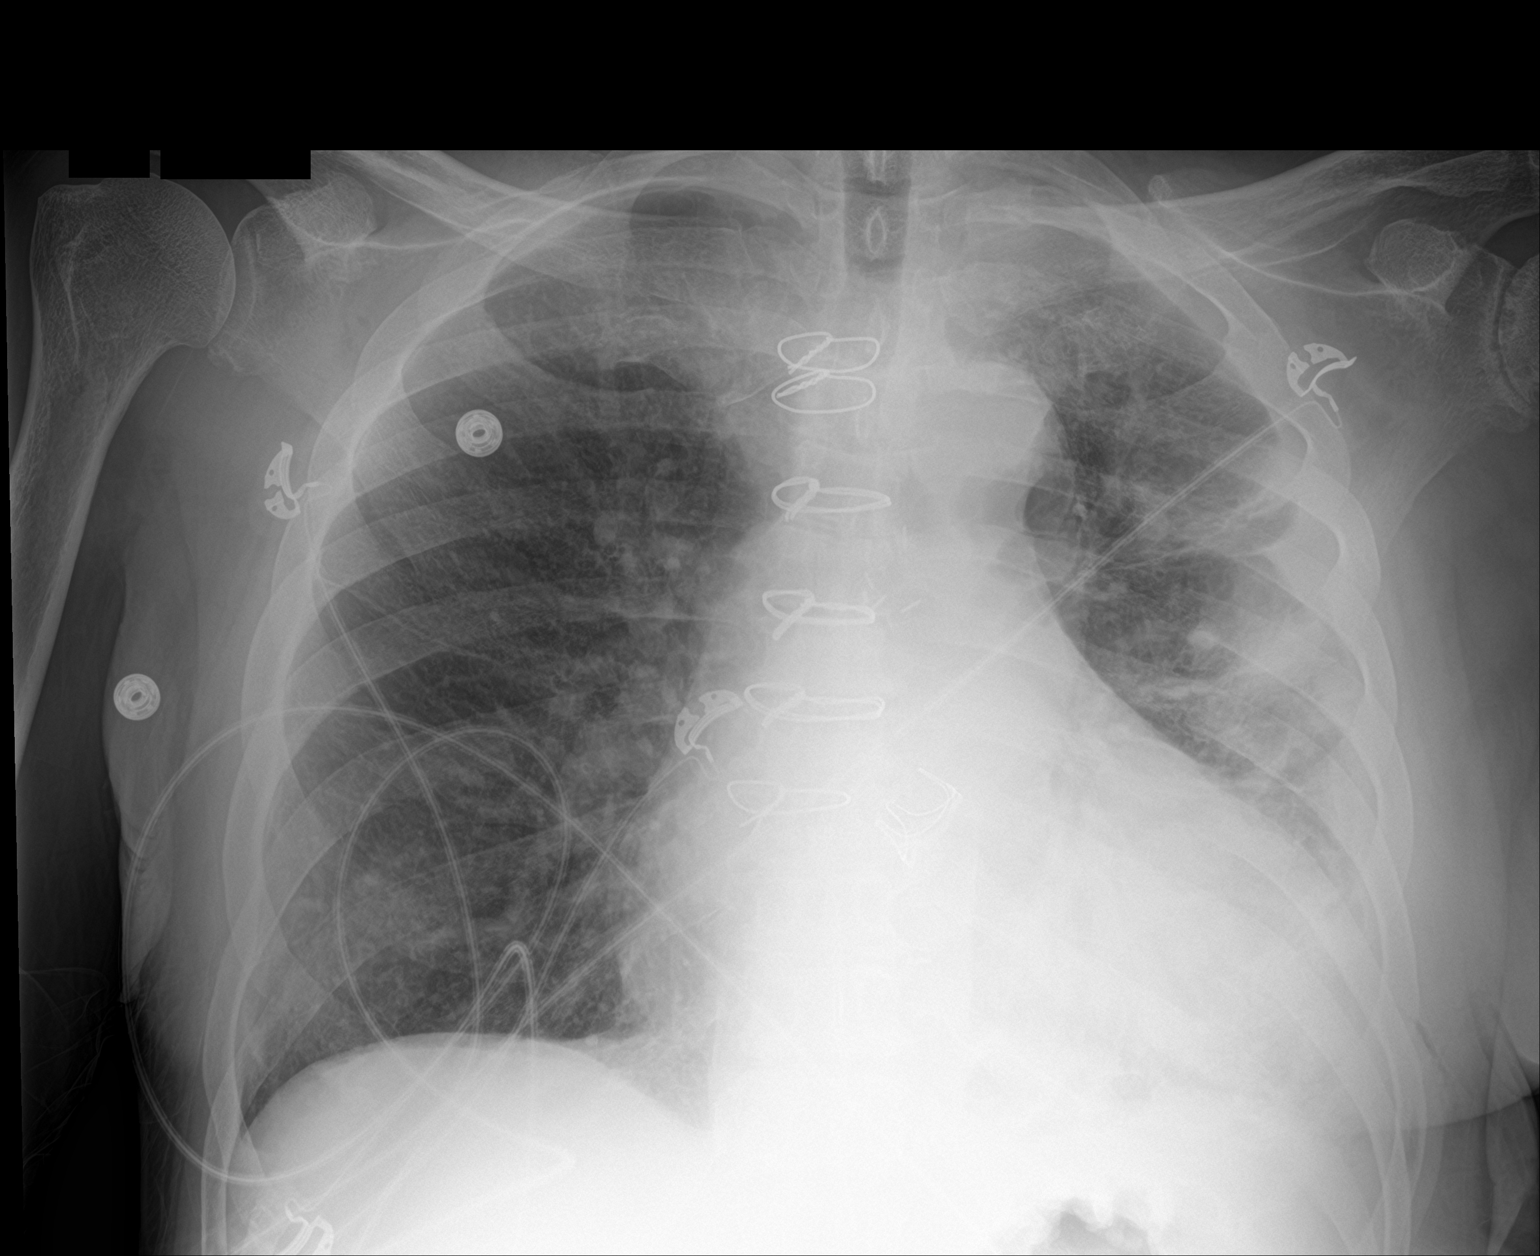

[chest lat]
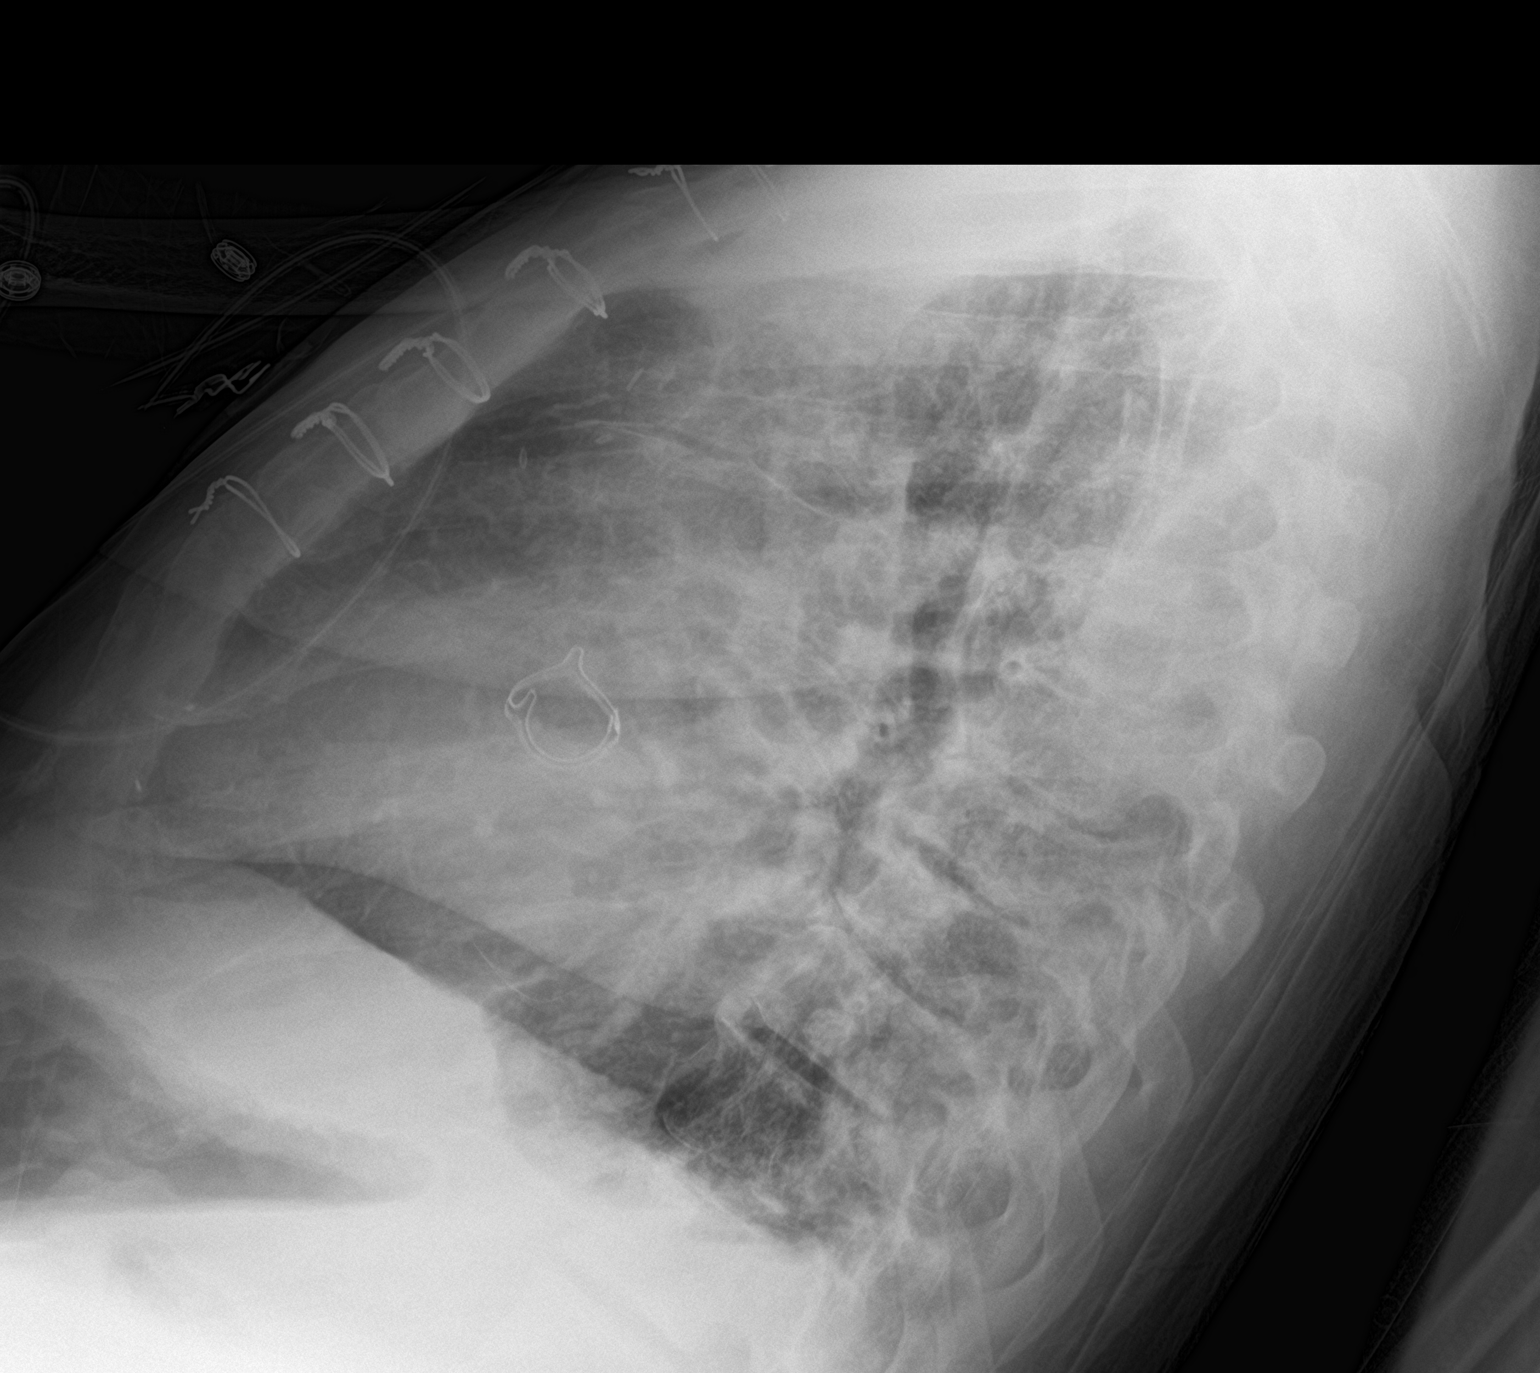

[2 of 2 positions shown; findings below may reference images not displayed]

FINDINGS: Worsening LEFT chest opacity, increasing atelectasis, infiltrate and
effusion. RIGHT lung clear. Cardiomegaly. CABG. AVR. No osseous
findings.
IMPRESSION: Worsening aeration.

Worsening LEFT chest opacity, with increasing atelectasis,
infiltrate and effusion.

## 2020-05-23 IMAGING — DX CHEST - 2 VIEW
2 series · 2 of 2 positions shown · non-contrast
Comparison: 11/10/2018

CLINICAL DATA: Left pleural effusion. Status post VATS with
drainage of pleural effusion and decortication of the parietal and
visceral pleura.

EXAM:
CHEST - 2 VIEW

[dg chest 2 view (1 of 2)]
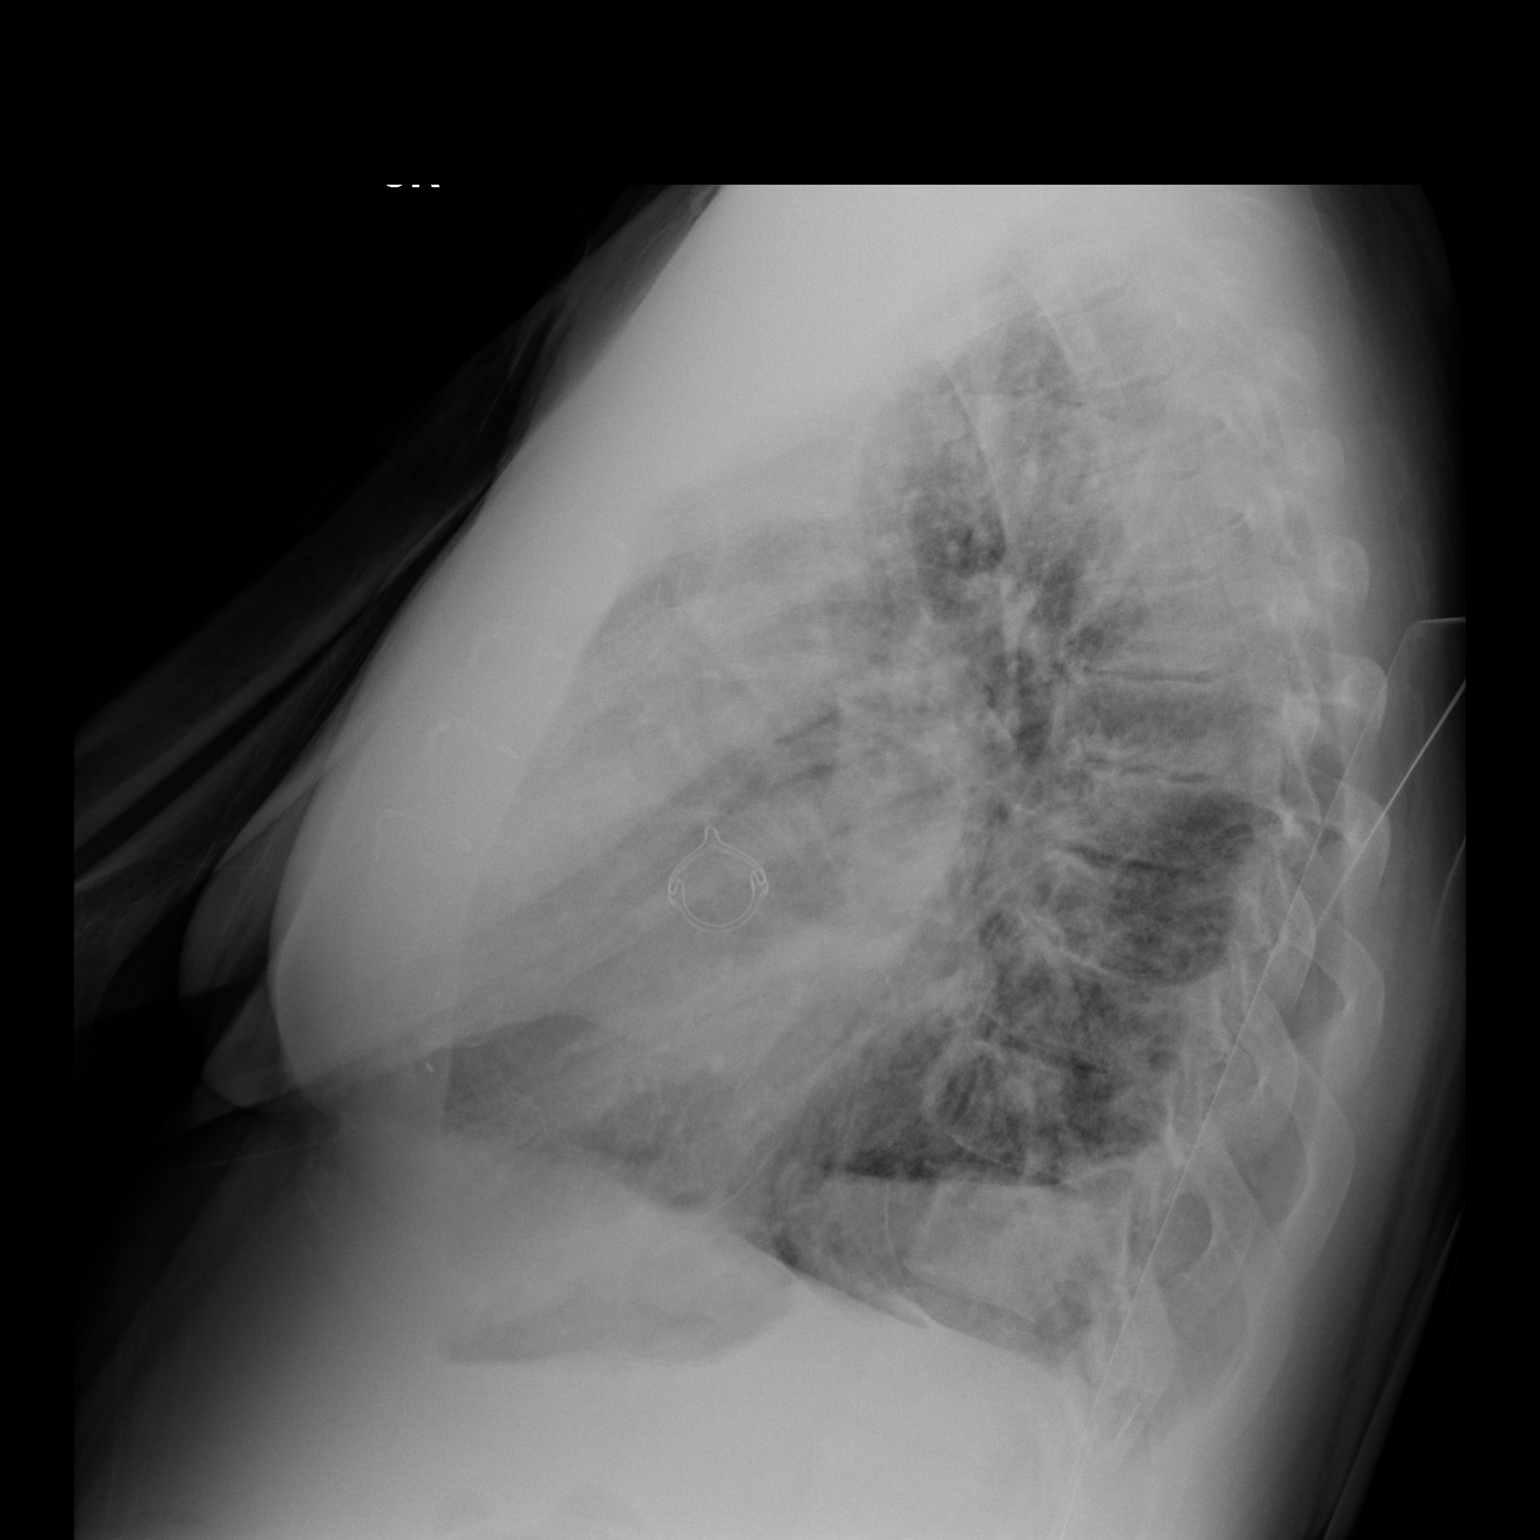

[dg chest 2 view (2 of 2)]
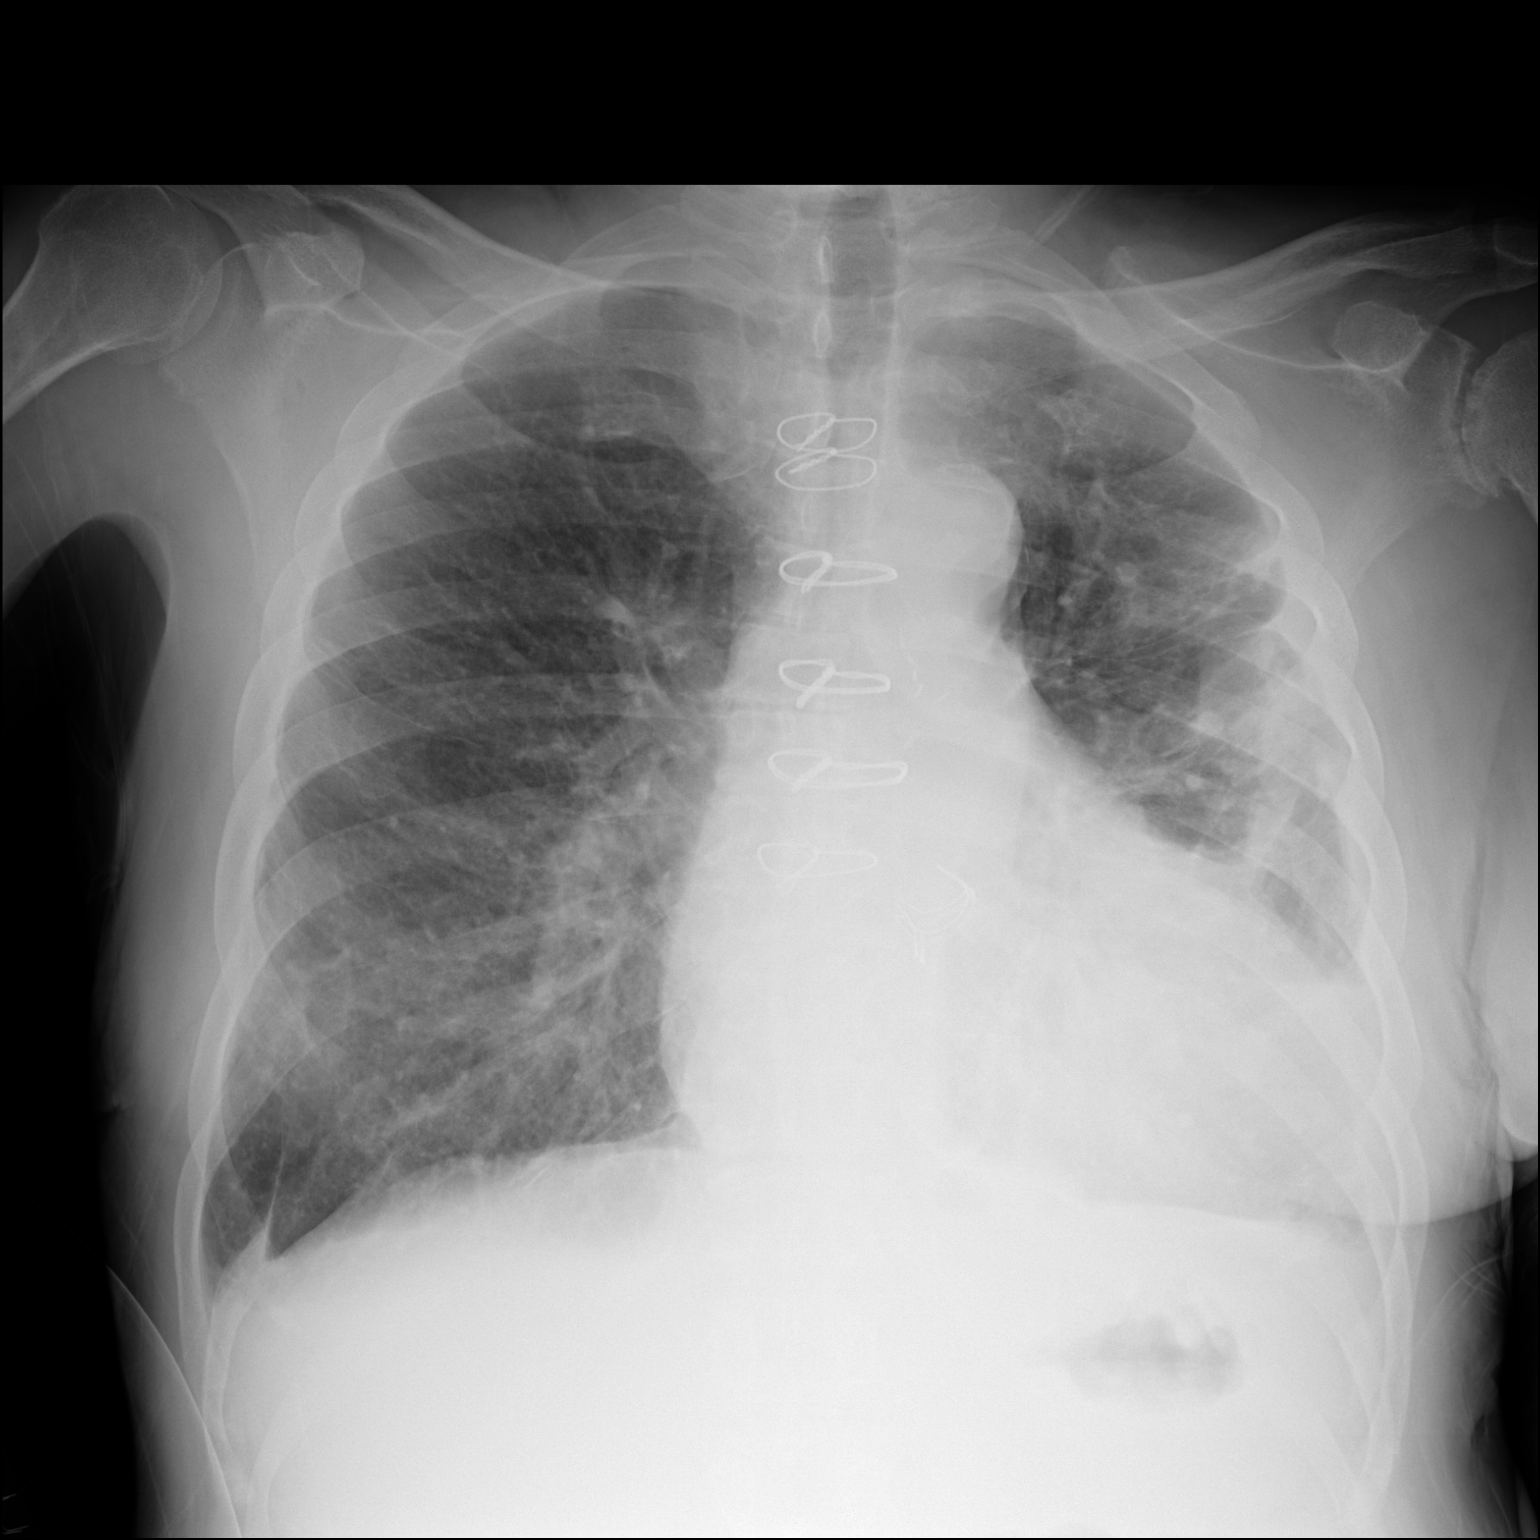

[2 of 2 positions shown; findings below may reference images not displayed]

FINDINGS: Unchanged cardiomegaly.  CABG.  Aortic valve replacement.

Pulmonary vascularity is normal. There is residual irregular a
pleural thickening in the left midzone and at the left lung base. On
the lateral view there is fluid visible in the left base. However,
overall aeration at the left lung base appears improved.

The interstitial markings at the right lung base are slightly
accentuated, more so than on the prior study.
IMPRESSION: 1. Small residual left pleural effusion with irregular pleural
thickening laterally in the midzone.
2. Slightly improved aeration at the left lung base.
3. Slight increased accentuation of the interstitial markings at the
right lung base, nonspecific.

## 2020-07-25 IMAGING — CR CHEST - 2 VIEW
2 series · 2 of 2 positions shown · non-contrast
Comparison: 11/29/2018

CLINICAL DATA: Previous thoracoscopy.  History of left effusion.

EXAM:
CHEST - 2 VIEW

[w chest lat *]
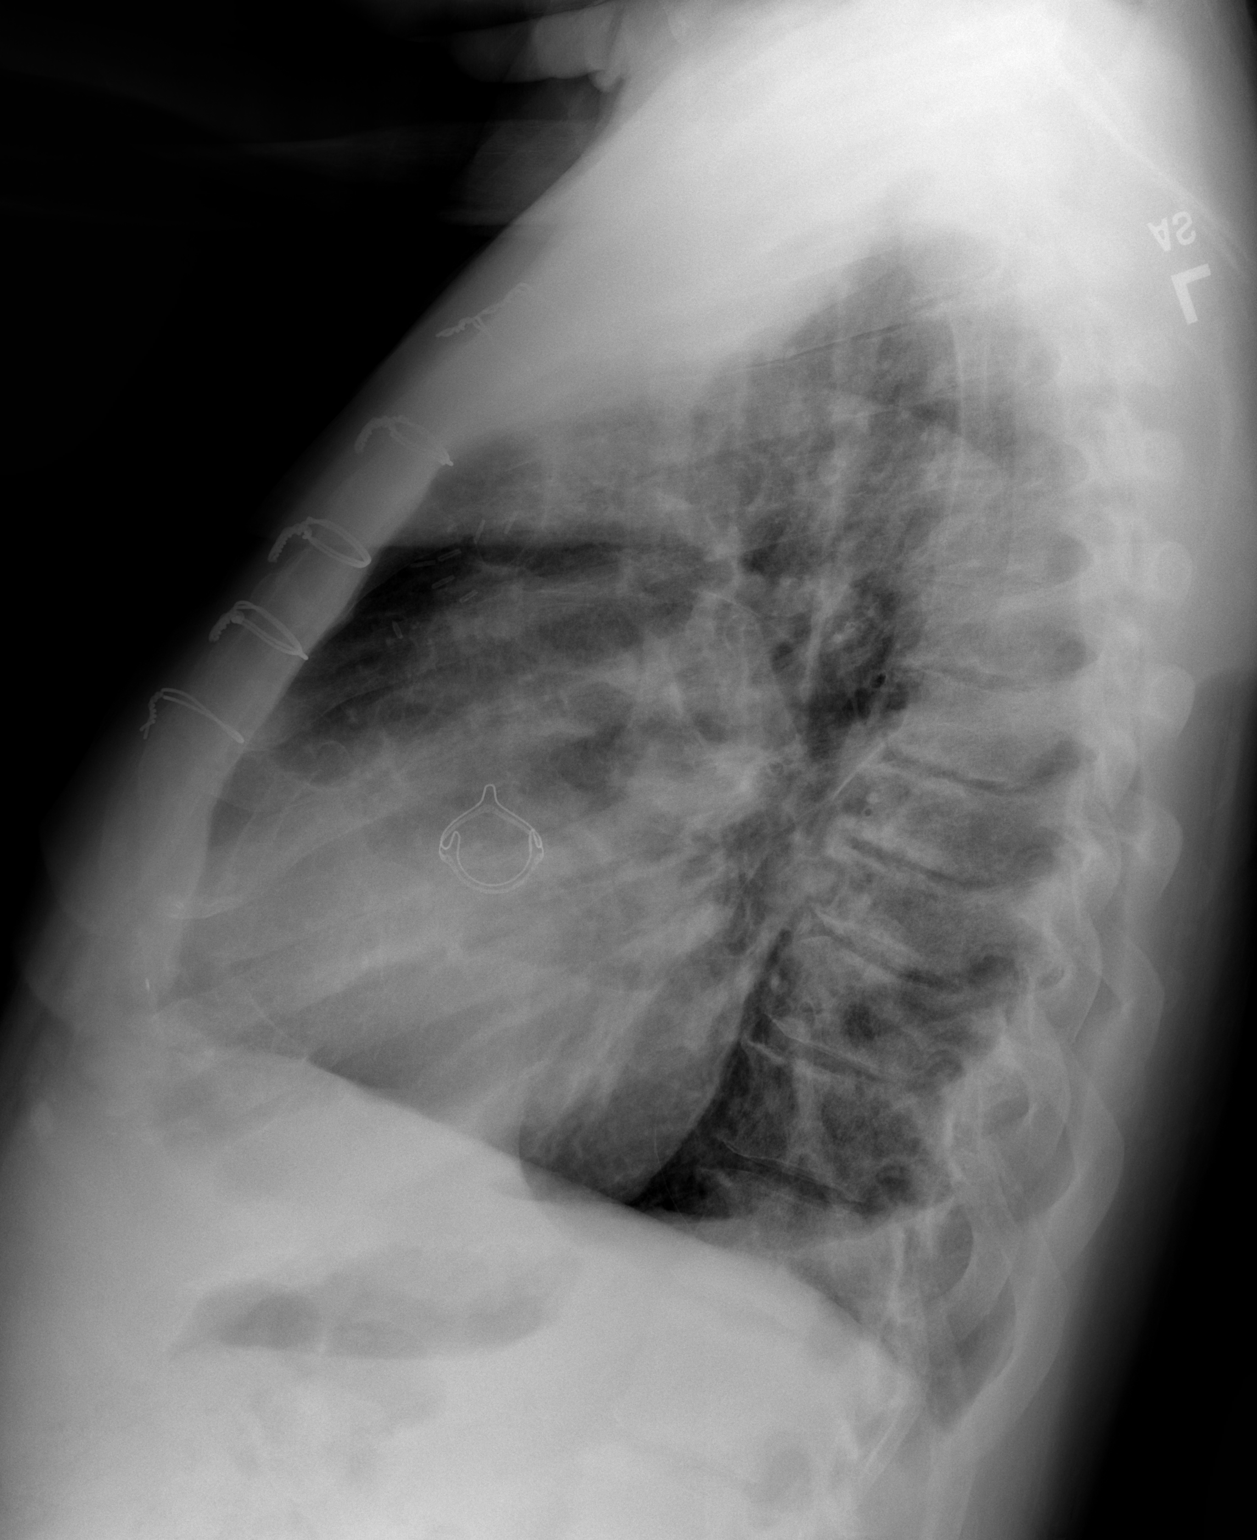

[w chest ap *]
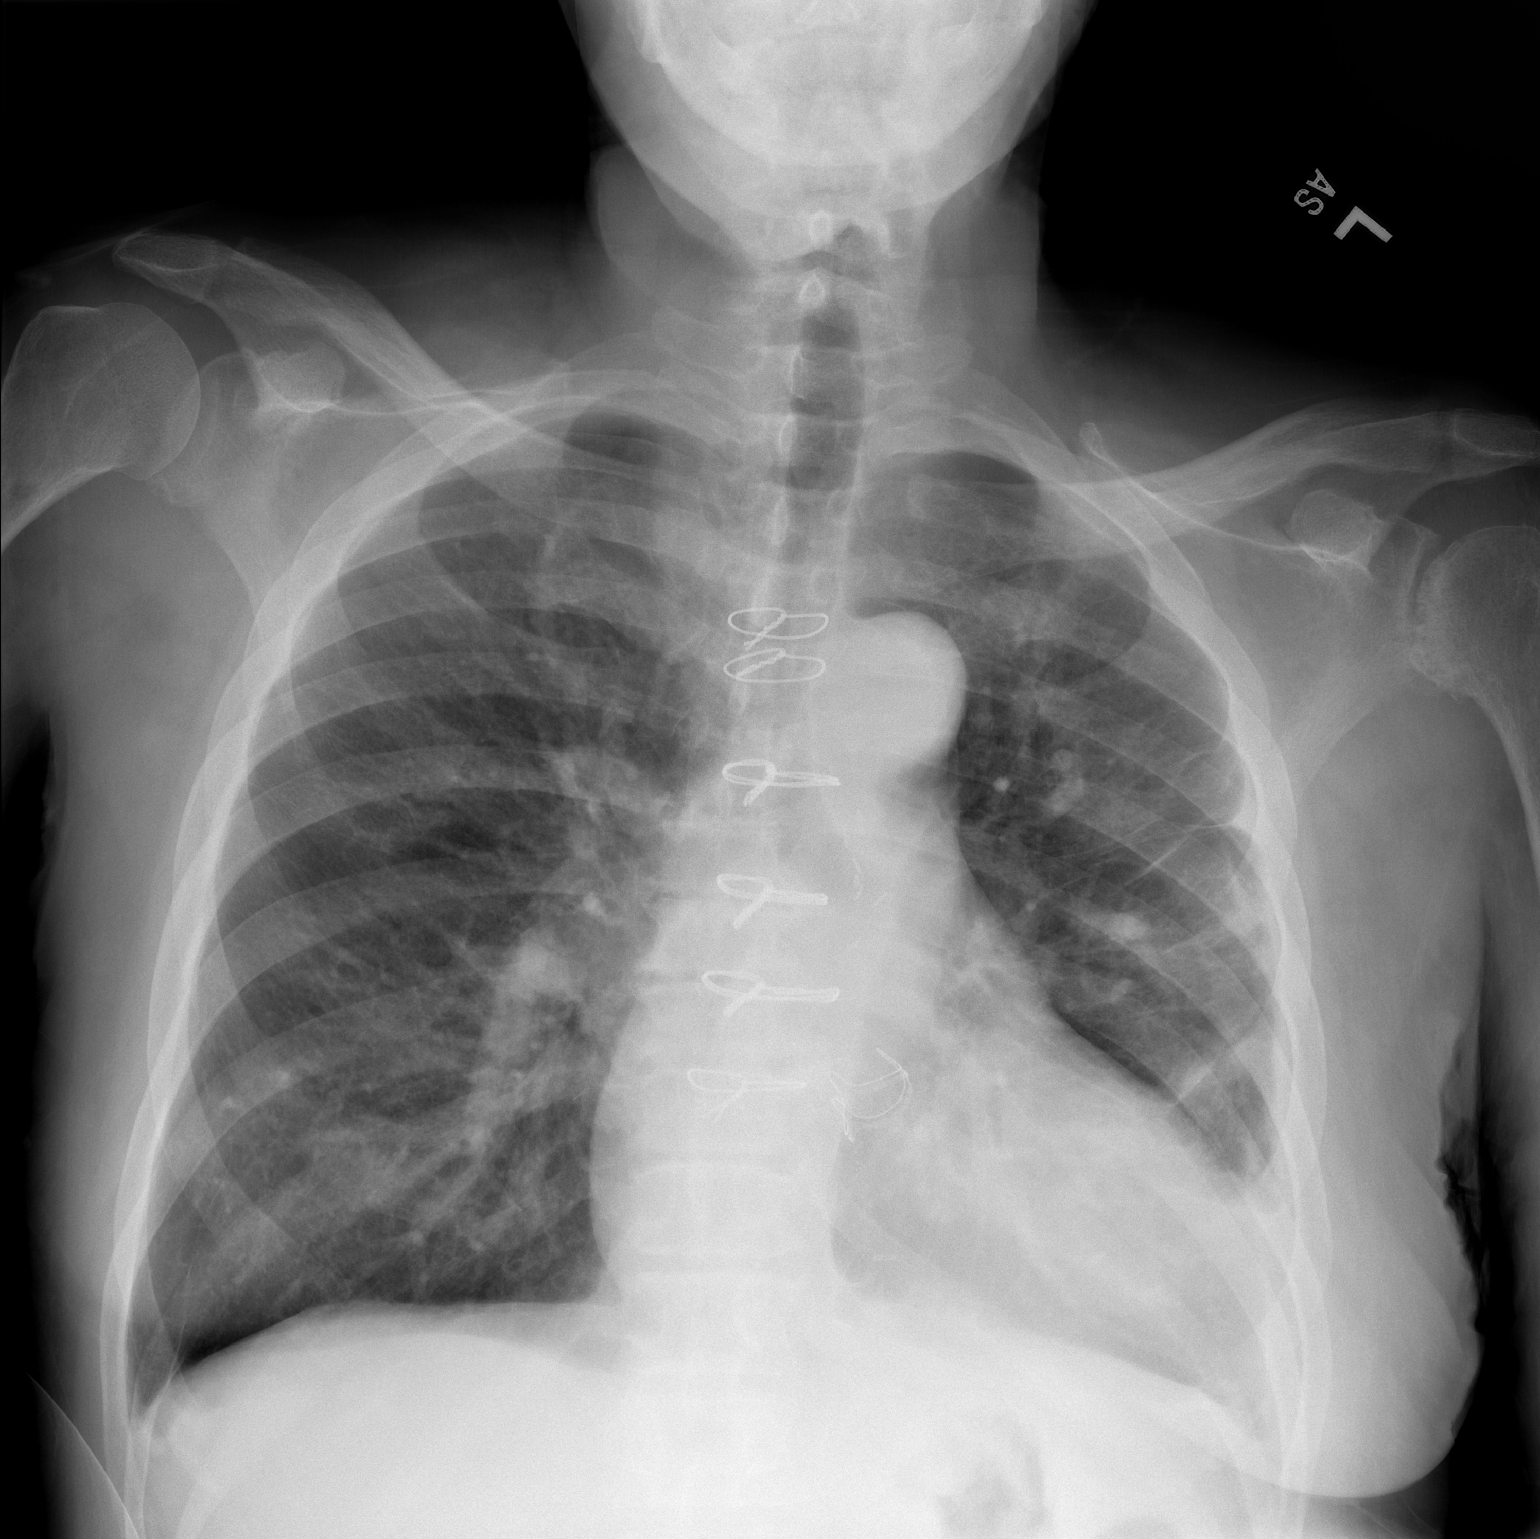

[2 of 2 positions shown; findings below may reference images not displayed]

FINDINGS: Previous median sternotomy and aortic valve replacement. Chronic
cardiomegaly with left ventricular prominence. Chronic aortic
atherosclerosis. Chronic bilateral lung markings, improved since the
study [DATE]. Mild pleural scarring but without evidence residual
frank effusion. No worsening or new finding.
IMPRESSION: Status post aortic valve replacement. Chronic pleural and
parenchymal scarring as seen previously. No evidence of recurrent
heart failure or effusion.

## 2020-11-09 IMAGING — DX PORTABLE CHEST - 1 VIEW
1 series · 1 of 1 positions shown · non-contrast
Comparison: Chest radiograph dated 05/18/2019

CLINICAL DATA: central line placement

EXAM:
PORTABLE CHEST 1 VIEW

[chest ap]
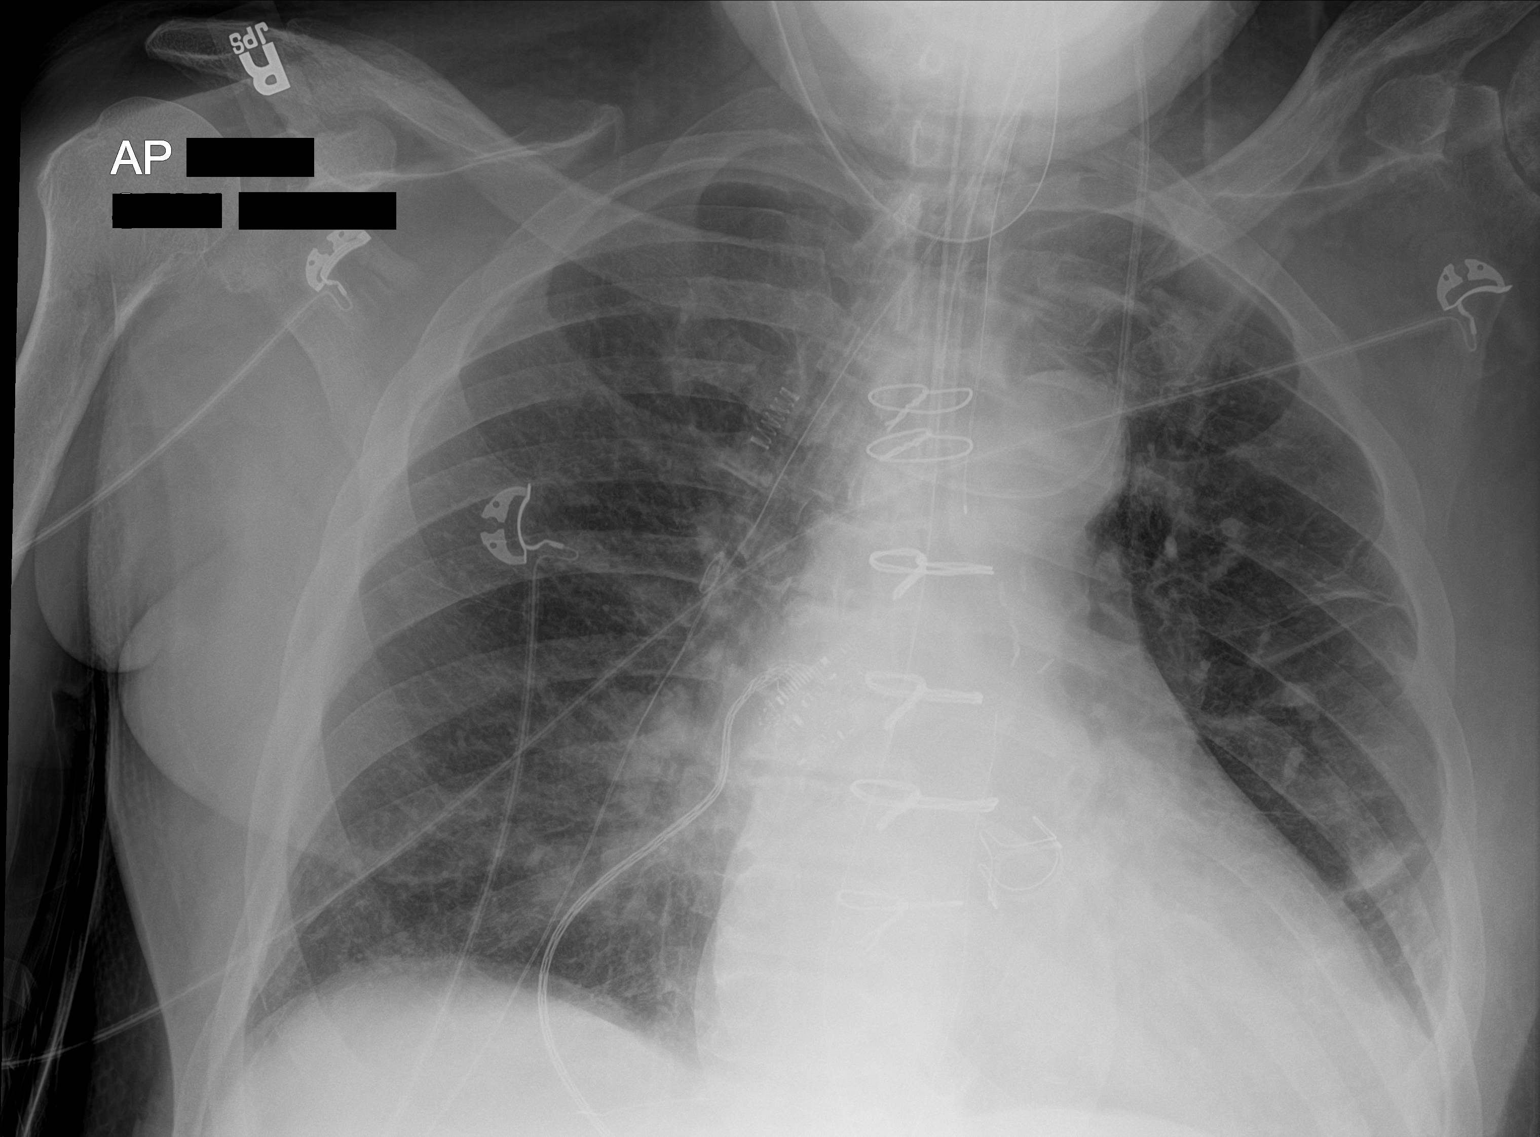

[1 of 1 positions shown; findings below may reference images not displayed]

FINDINGS: Interval placement of a left central venous catheter with tip
overlying the brachiocephalic vein. Esophageal probe tip overlies
the mid/upper esophagus.

Endotracheal tube tip lies approximately 2 cm above the carina.
Nasogastric tube remains in place.

Stable enlarged cardiomediastinal contours status post median
sternotomy and valve replacement. Scattered left lung linear
opacities likely reflect atelectasis. No definite pleural effusion
though the left costophrenic angle is excluded from field of view.
No pneumothorax.
IMPRESSION: Interval placement of left central venous catheter with tip
overlying the brachiocephalic vein. Consider advancement into the
SVC.

## 2020-11-09 IMAGING — DX PORTABLE CHEST - 1 VIEW
1 series · 1 of 1 positions shown · non-contrast
Comparison: 01/31/2019.

CLINICAL DATA: Intubation.  Status post CPR.

EXAM:
PORTABLE CHEST 1 VIEW

[chest]
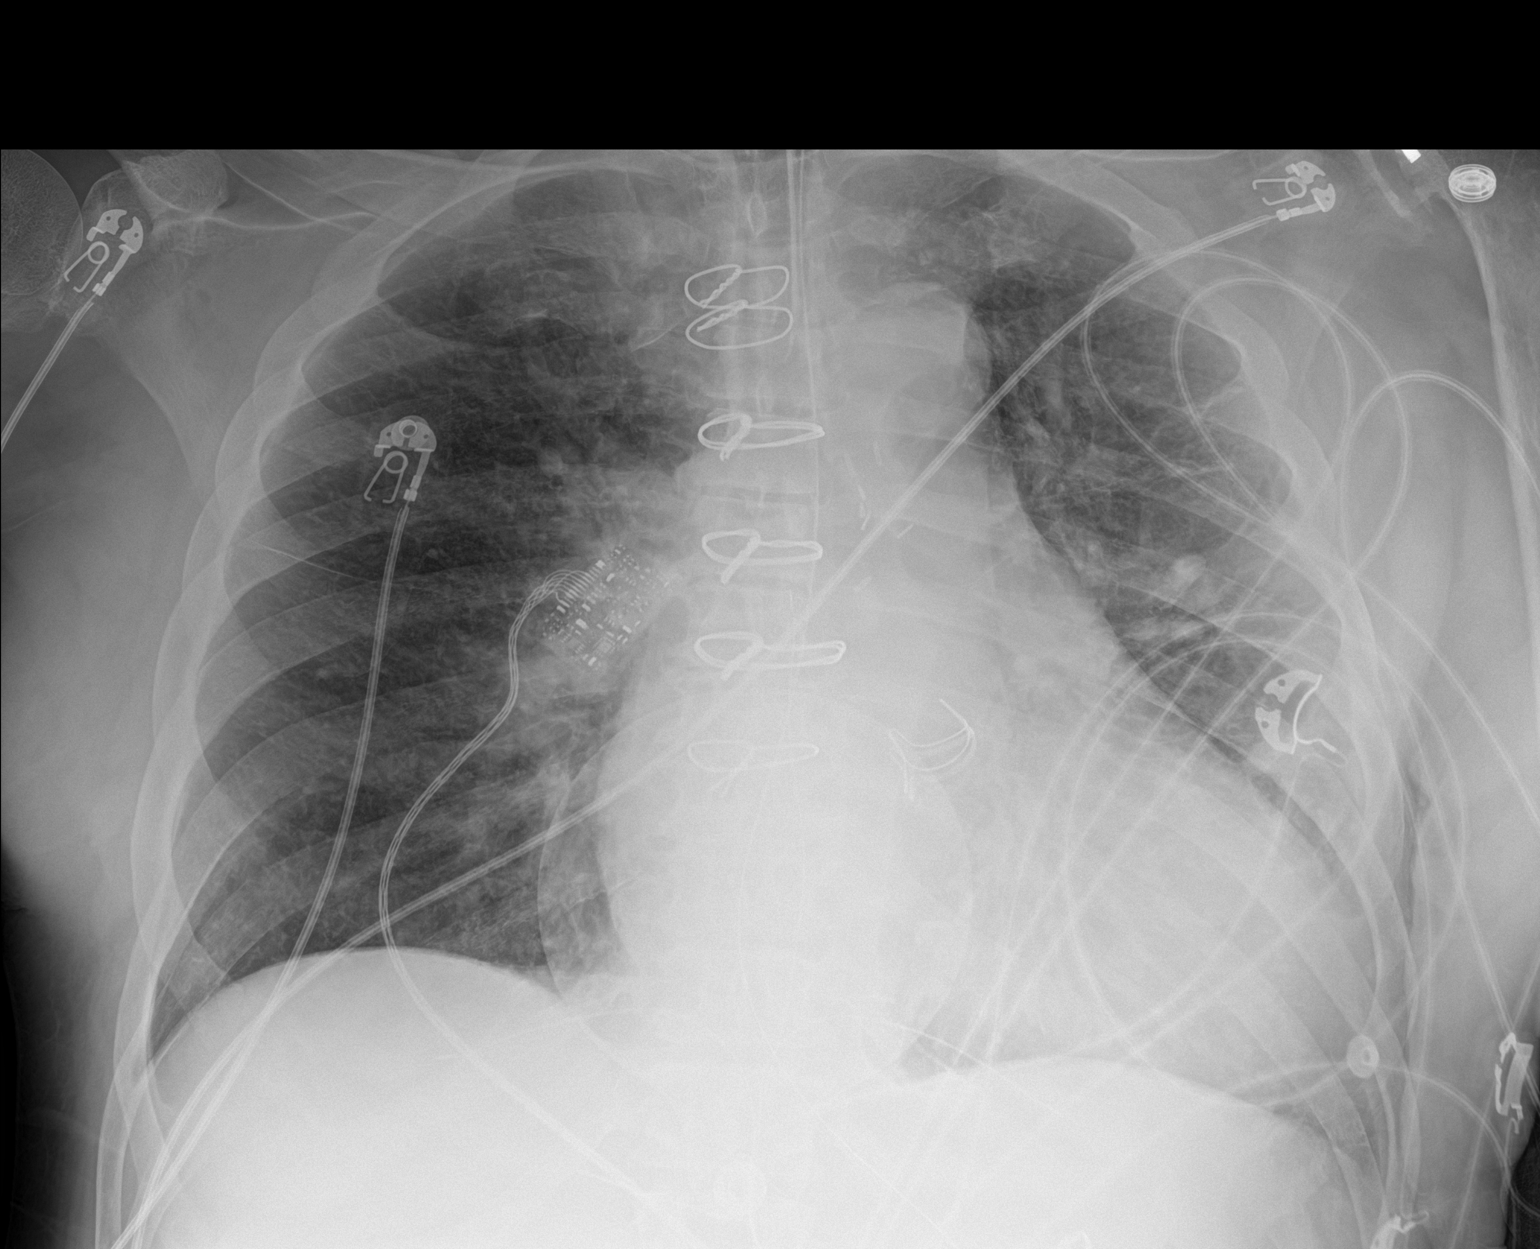

[1 of 1 positions shown; findings below may reference images not displayed]

FINDINGS: Endotracheal tube noted with tip 3 cm above the carina. NG tube
noted with tip below hemidiaphragm. Prior CABG and cardiac valve
replacement. Cardiomegaly with mild bilateral interstitial
prominence. Mild CHF cannot be excluded. No prominent pleural
effusion. No pneumothorax. No acute bony abnormality.
IMPRESSION: 1. Endotracheal tube noted with tip 3 cm above the carina. NG tube
noted with tip below left hemidiaphragm.

2. Prior CABG and cardiac valve replacement. Cardiomegaly with mild
bilateral interstitial prominence. Mild CHF cannot be excluded.

## 2020-11-10 IMAGING — DX PORTABLE CHEST - 1 VIEW
1 series · 1 of 1 positions shown · non-contrast
Comparison: 05/18/2019

CLINICAL DATA: Acute respiratory failure, post CPR, aortic valvular
disease

EXAM:
PORTABLE CHEST 1 VIEW

[chest]
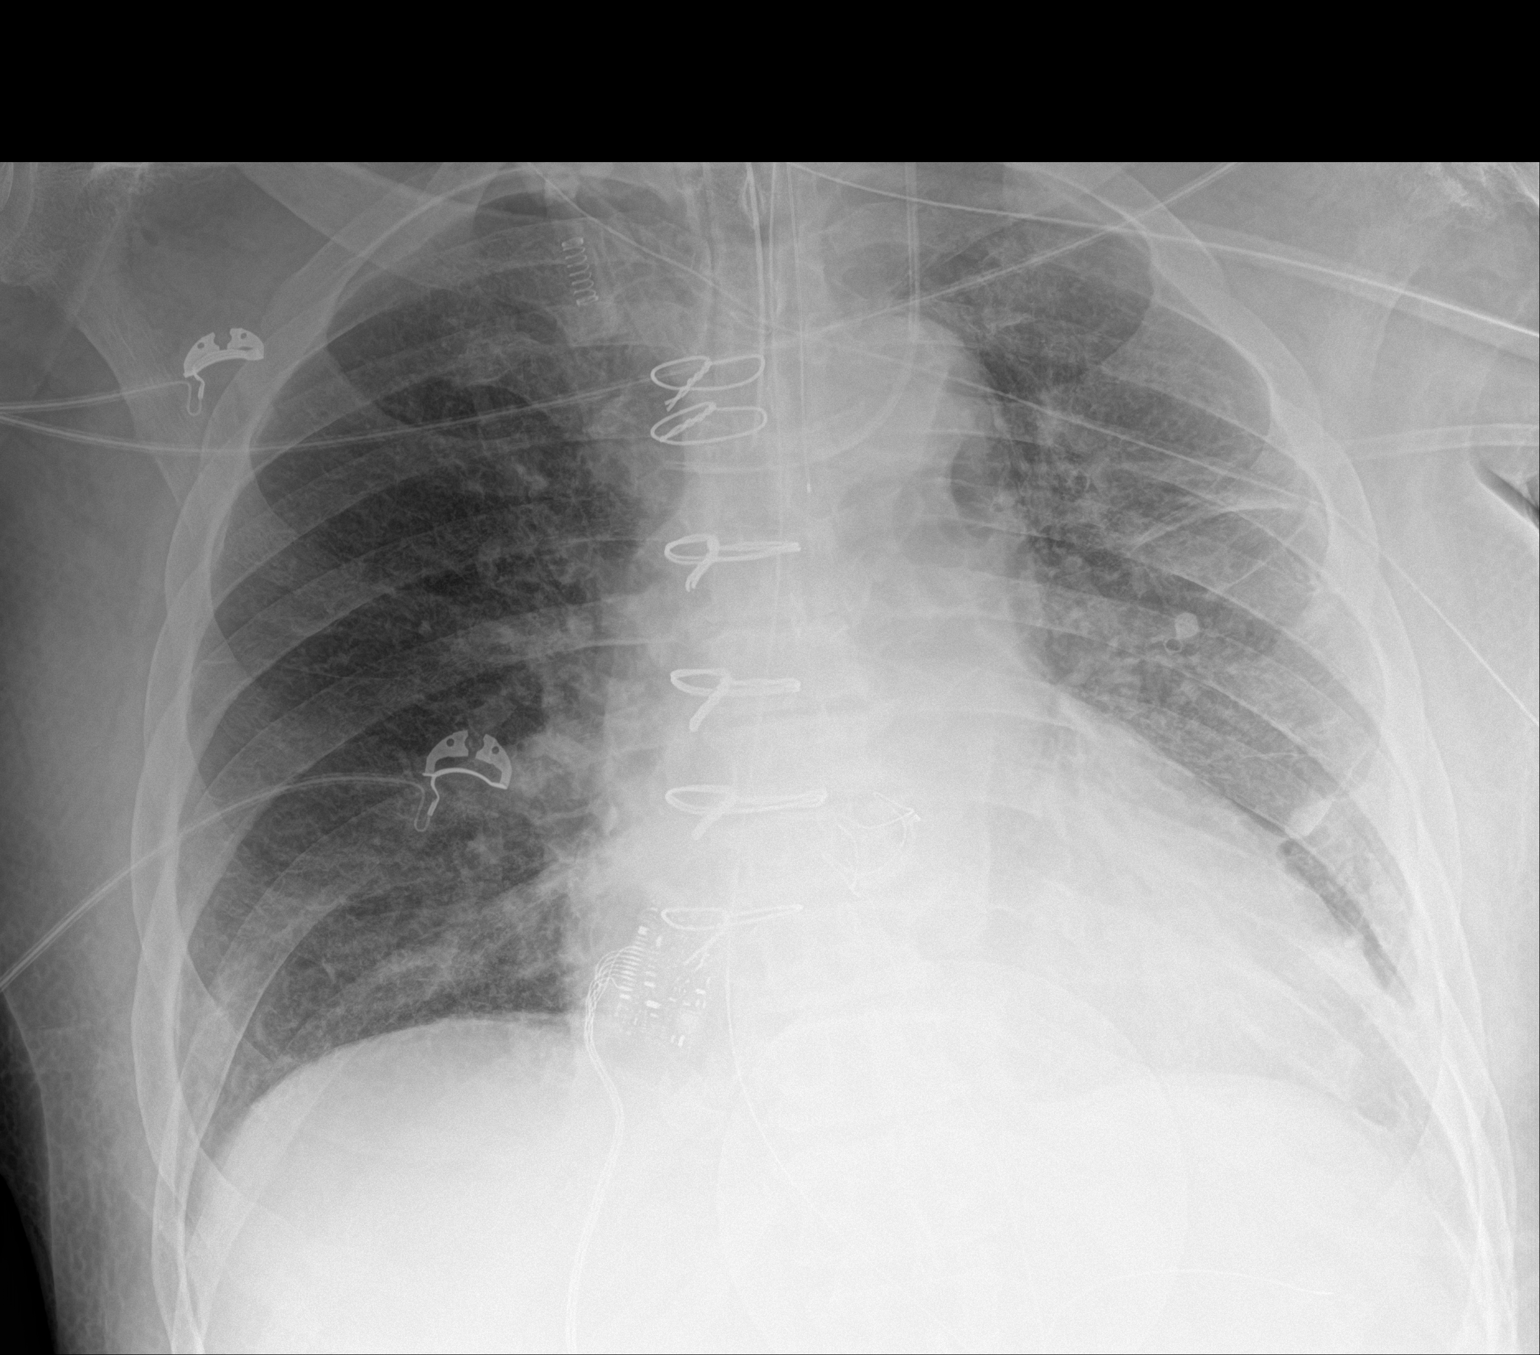

[1 of 1 positions shown; findings below may reference images not displayed]

FINDINGS: Endotracheal tube remains 3.3 cm above the carina. NG tube extends
into the stomach. Left IJ central line tip at the innominate venous
confluence as before. Previous aortic valve replacement evident.
Heart is enlarged with similar vascular congestion and scattered
streaky left mid and lower lung atelectasis. Stable right lung
aeration. No pneumothorax. No enlarging effusion.
IMPRESSION: Stable cardiomegaly, vascular congestion and scattered left lung
atelectasis.

No significant interval change.

## 2020-11-13 IMAGING — CT CT HEAD W/O CM
5 of 6 series · 17 of 47 positions shown, 18 images · non-contrast
Comparison: 07/11/2018

CLINICAL DATA: Encephalopathy

EXAM:
CT HEAD WITHOUT CONTRAST
TECHNIQUE: Contiguous axial images were obtained from the base of the skull
through the vertex without intravenous contrast.

[Series 3: head without · axial · non-contrast · 0.52mm/px · z∈[-101,-41]mm · 2 of 38 slices shown, 3 images]
[im 13/38  brain]
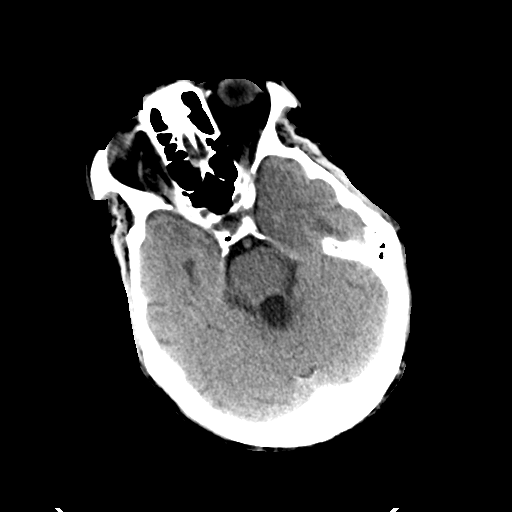
[im 13/38  bone]
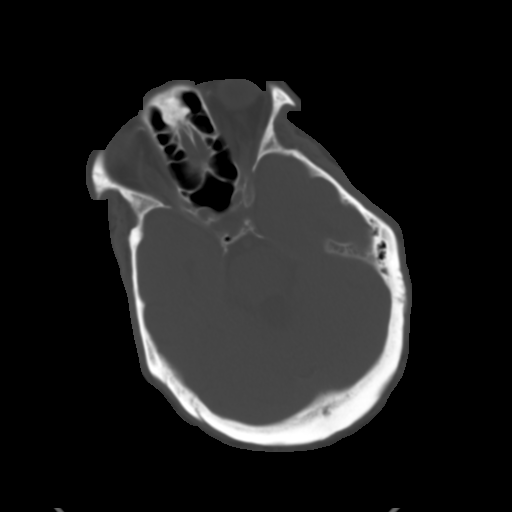
[im 25/38  brain]
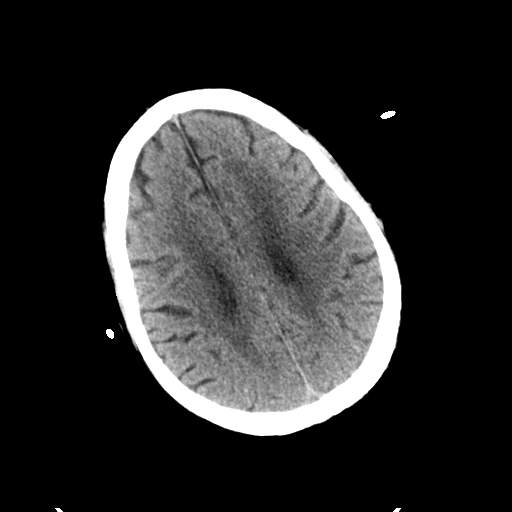

[Series 4: head bone · axial · 0.52mm/px · z∈[-143,-13]mm · 7 of 94 slices shown]
[im 10/94  bone]
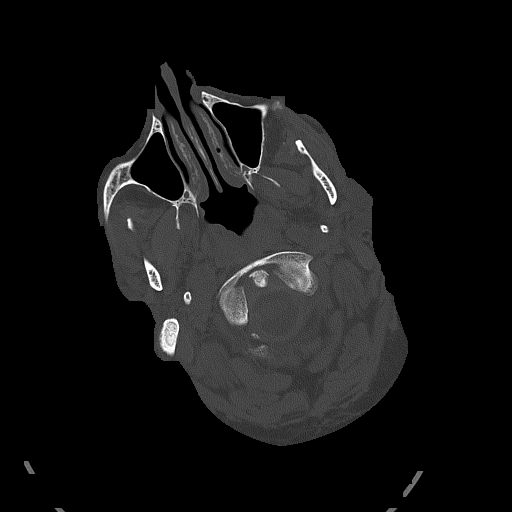
[im 19/94  bone]
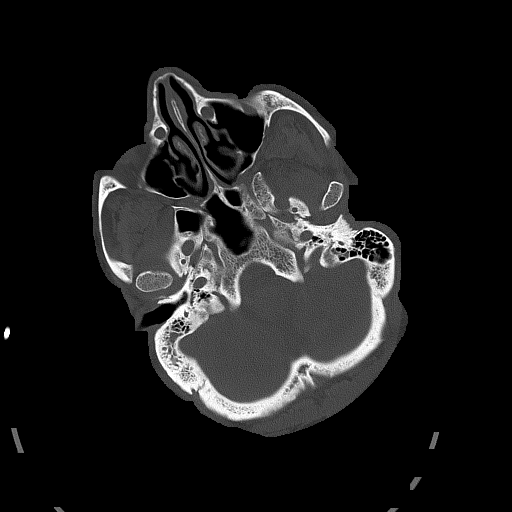
[im 28/94  bone]
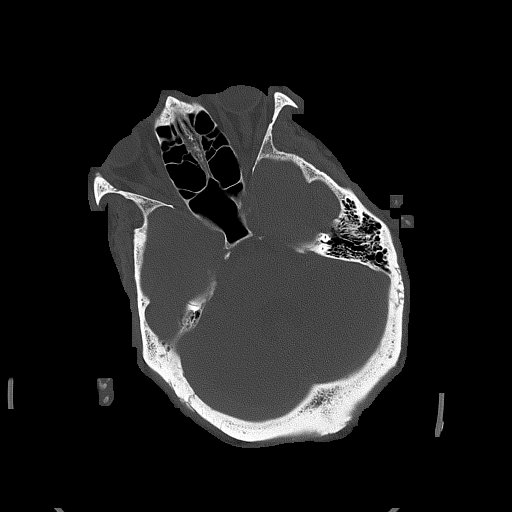
[im 38/94  bone]
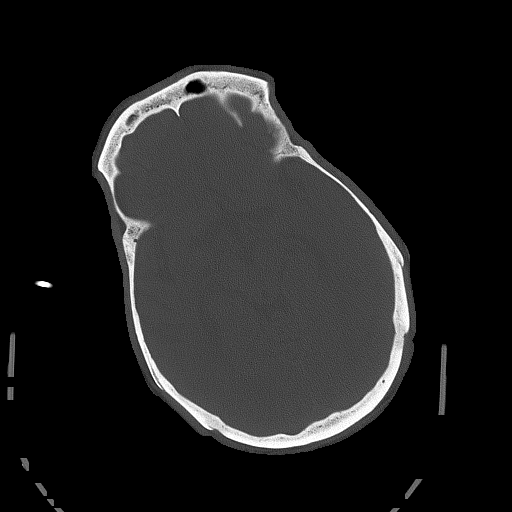
[im 56/94  bone]
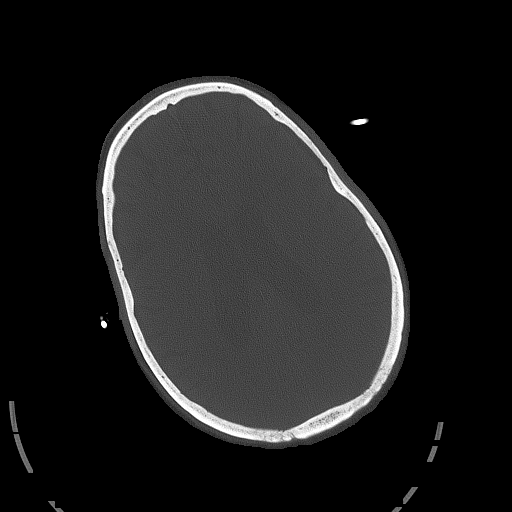
[im 66/94  bone]
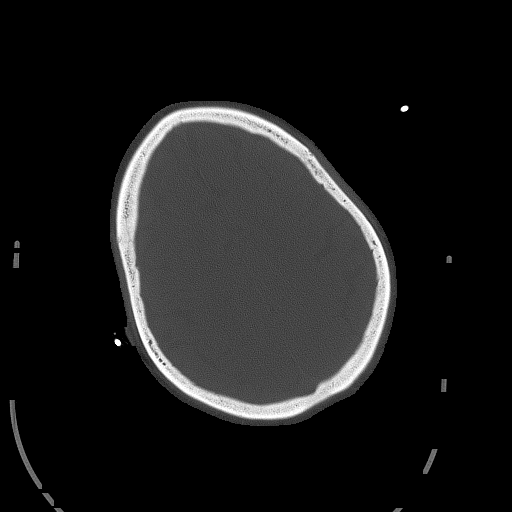
[im 75/94  bone]
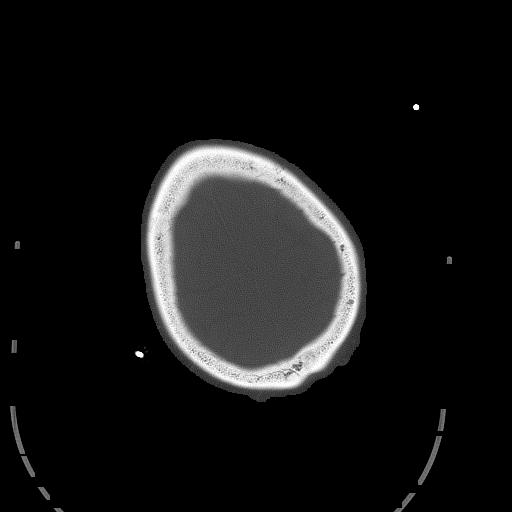

[Series 5: head without cor · coronal · non-contrast · 0.36mm/px · 3 of 73 slices shown]
[im 25/73  brain]
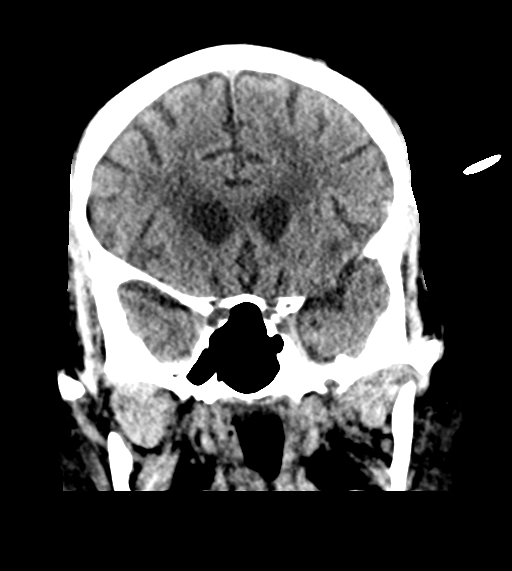
[im 33/73  brain]
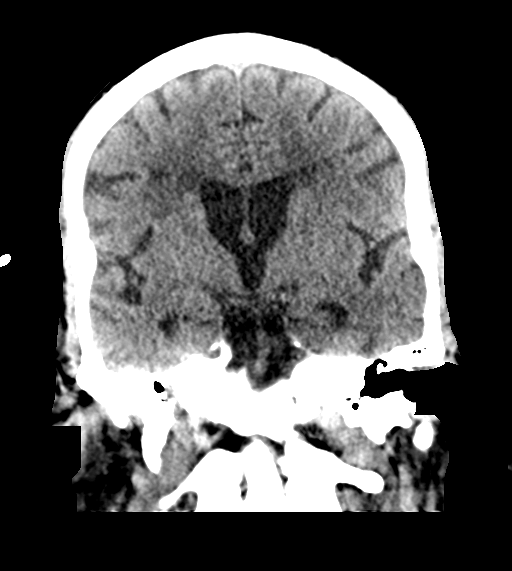
[im 41/73  brain]
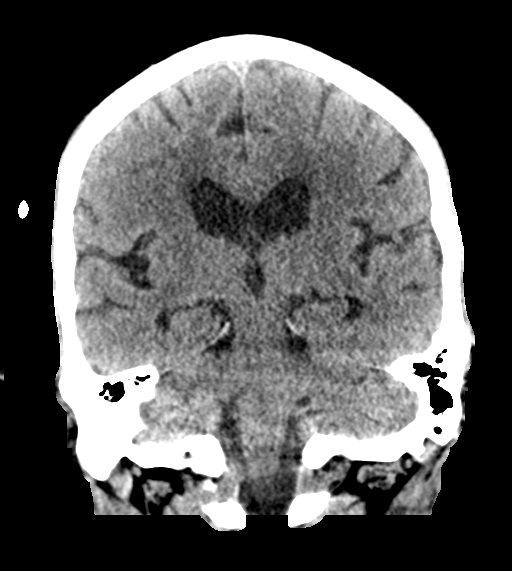

[Series 6: head without sag · sagittal · non-contrast · 0.36mm/px · 3 of 64 slices shown]
[im 22/64  brain]
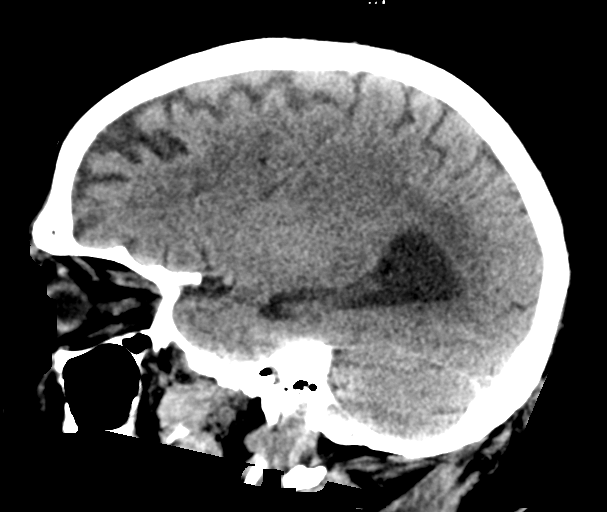
[im 32/64  brain]
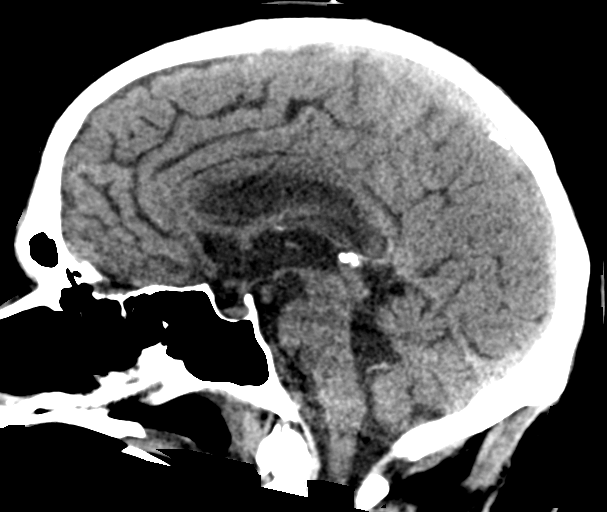
[im 43/64  brain]
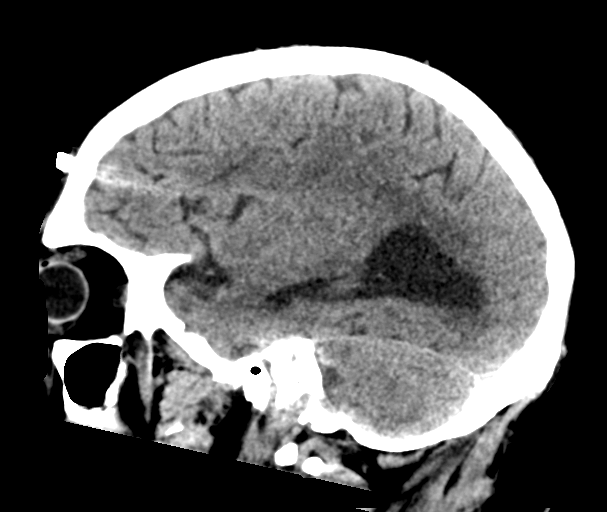

[Series 7: head without ax · axial · non-contrast · 0.34mm/px · z∈[-132,-75]mm · 2 of 36 slices shown]
[im 12/36  brain]
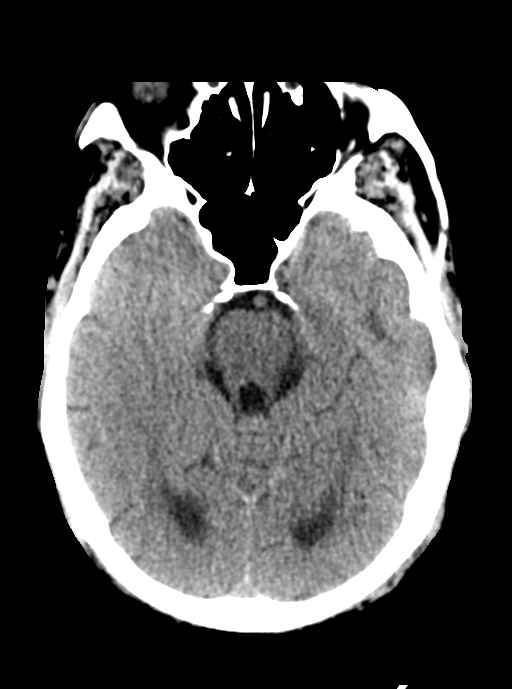
[im 24/36  brain]
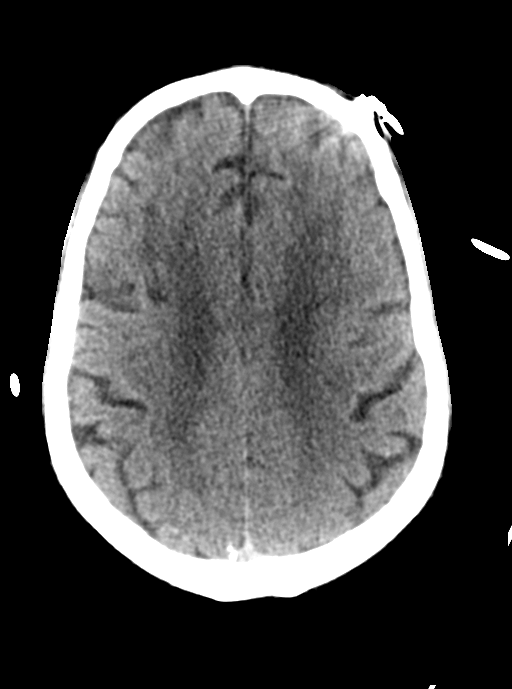

[17 of 47 positions shown; findings below may reference images not displayed]

FINDINGS: Brain: No evidence of acute infarction, hemorrhage, hydrocephalus,
extra-axial collection or mass lesion/mass effect. Periventricular
white matter hypodensity.

Vascular: No hyperdense vessel or unexpected calcification.

Skull: Normal. Negative for fracture or focal lesion.

Sinuses/Orbits: No acute finding.

Other: None.
IMPRESSION: No acute intracranial pathology.  Small-vessel white matter disease.

## 2020-11-14 IMAGING — MR MR HEAD W/O CM
8 series · 48 of 48 positions shown · non-contrast
Comparison: Prior CT from 05/22/2019.

CLINICAL DATA: Initial evaluation for acute altered mental status.

EXAM:
MRI HEAD WITHOUT CONTRAST
TECHNIQUE: Multiplanar, multiecho pulse sequences of the brain and surrounding
structures were obtained without intravenous contrast.

[Series 5: DWI · axial · 3.0mm · 0.88mm/px · z∈[-42,+91]mm · 15 of 94 slices shown (1 of 2)]
[im 1/94]
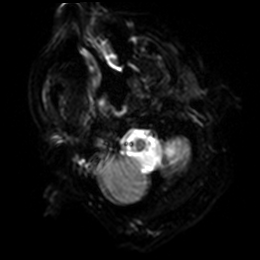
[im 7/94]
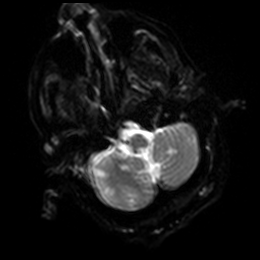
[im 14/94]
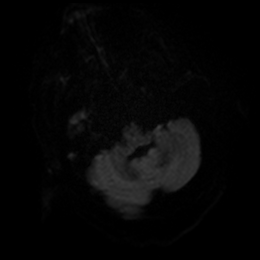
[im 20/94]
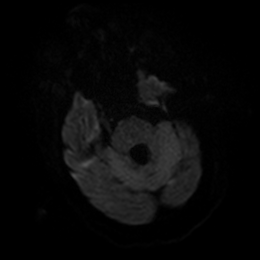
[im 27/94]
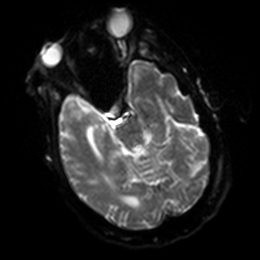
[im 34/94]
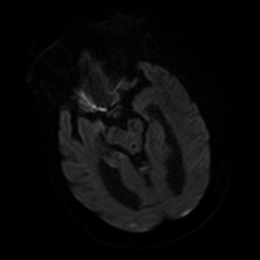
[im 40/94]
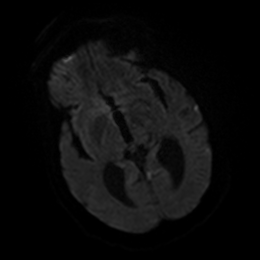
[im 47/94]
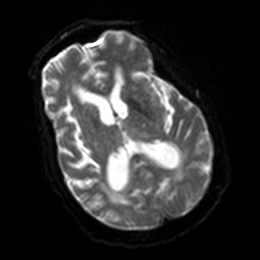
[im 54/94]
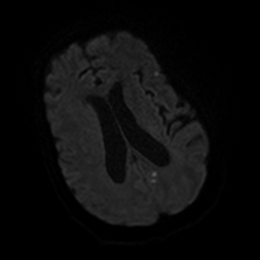
[im 60/94]
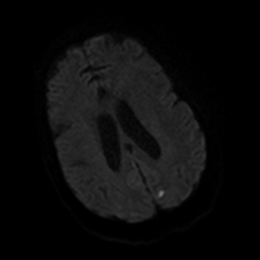
[im 67/94]
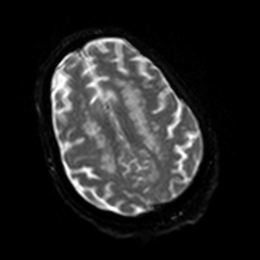
[im 74/94]
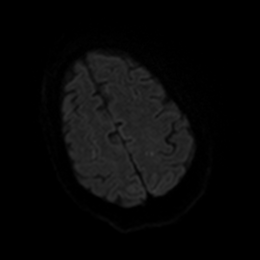
[im 80/94]
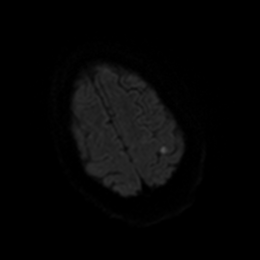
[im 87/94]
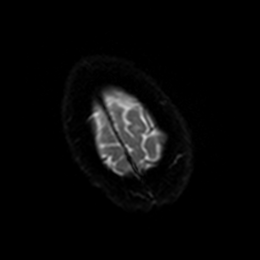
[im 94/94]
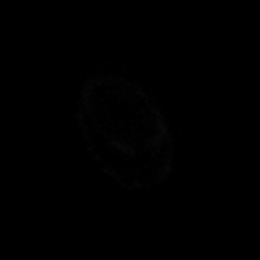

[Series 6: DWI · axial · 3.0mm · 0.88mm/px · z∈[-42,+91]mm · 8 of 47 slices shown (2 of 2)]
[im 1/47]
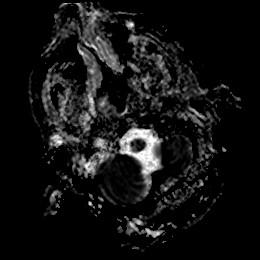
[im 7/47]
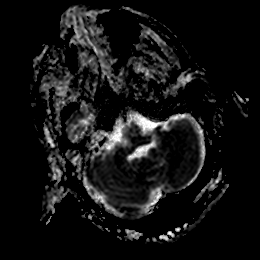
[im 14/47]
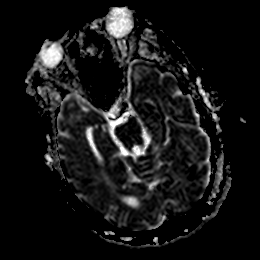
[im 20/47]
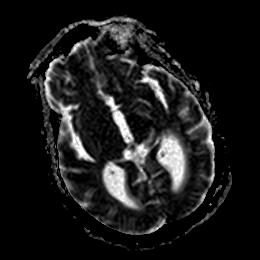
[im 27/47]
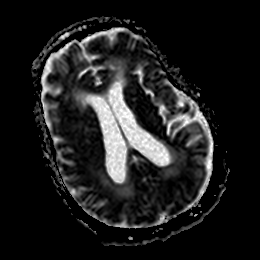
[im 33/47]
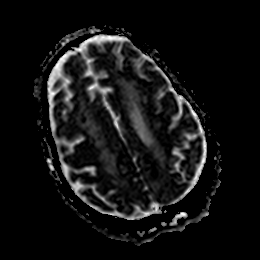
[im 40/47]
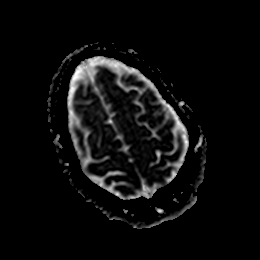
[im 47/47]
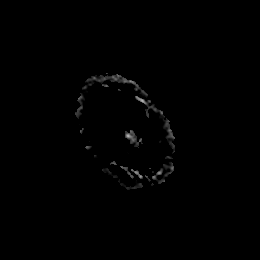

[Series 7: T2 · axial · 5.0mm · 0.72mm/px · z∈[-50,+90]mm · 4 of 25 slices shown (1 of 2)]
[im 1/25]
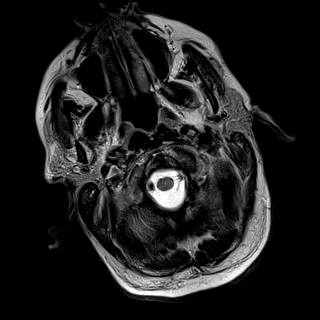
[im 9/25]
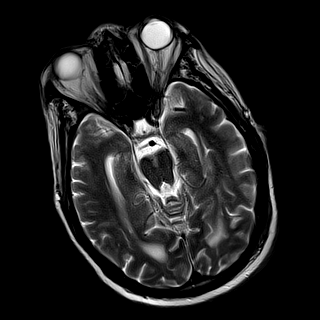
[im 17/25]
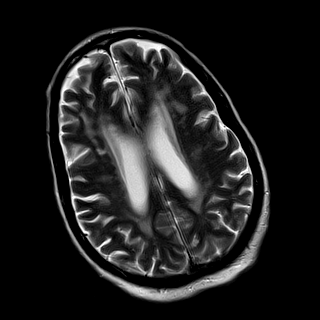
[im 25/25]
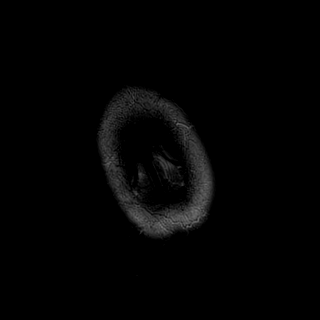

[Series 14: FLAIR · axial · 5.0mm · 0.90mm/px · z∈[-44,+96]mm · 4 of 25 slices shown (1 of 2)]
[im 1/25]
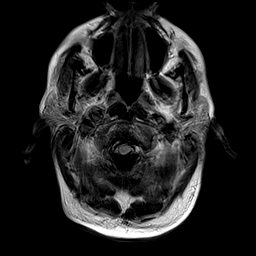
[im 9/25]
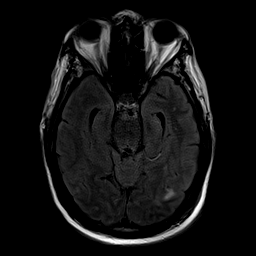
[im 17/25]
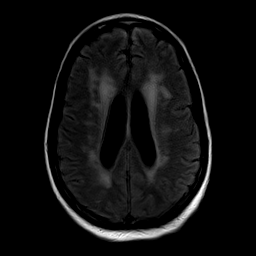
[im 25/25]
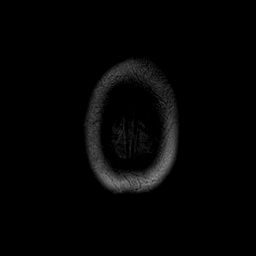

[Series 15: ax hemo · axial · 5.0mm · 0.86mm/px · z∈[-47,+94]mm · 4 of 25 slices shown]
[im 1/25]
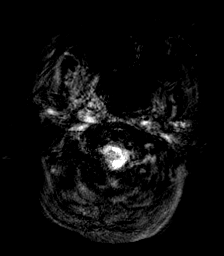
[im 9/25]
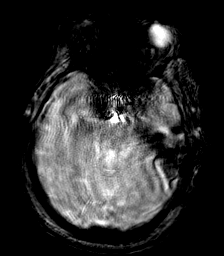
[im 17/25]
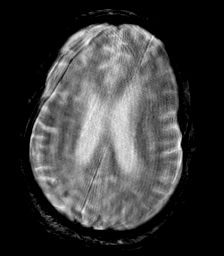
[im 25/25]
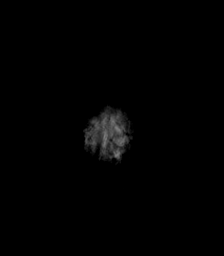

[Series 16: FLAIR · axial · 5.0mm · 0.90mm/px · z∈[-43,+98]mm · 4 of 25 slices shown (2 of 2)]
[im 1/25]
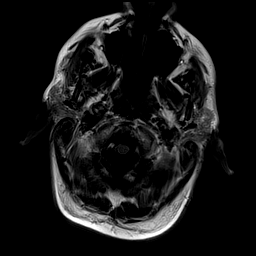
[im 9/25]
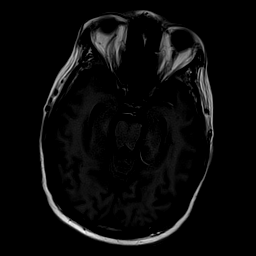
[im 17/25]
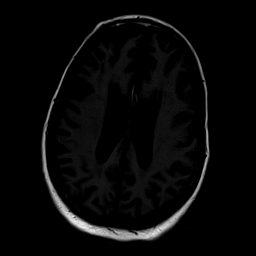
[im 25/25]
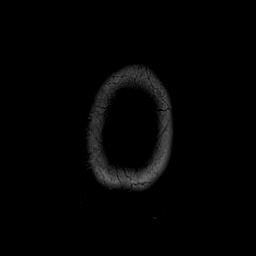

[Series 17: T2 · coronal · 5.0mm · 0.72mm/px · 5 of 30 slices shown (2 of 2)]
[im 1/30]
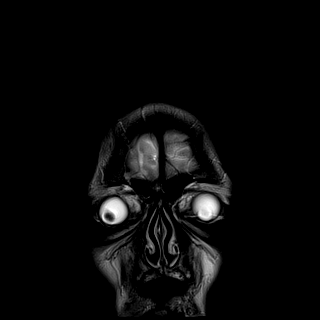
[im 8/30]
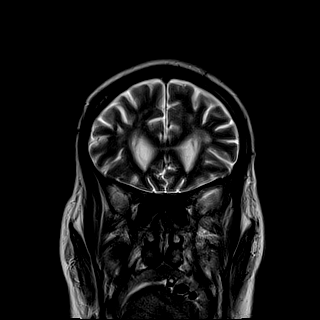
[im 15/30]
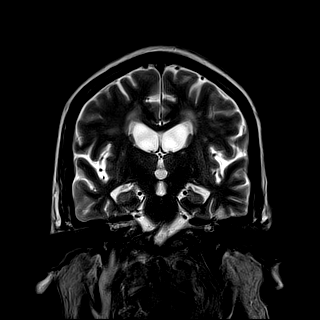
[im 22/30]
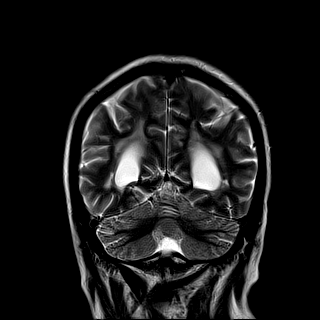
[im 30/30]
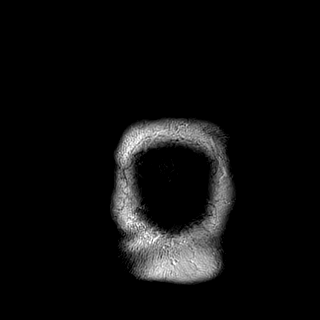

[Series 18: T1 · sagittal · 5.0mm · 0.75mm/px · 4 of 24 slices shown]
[im 1/24]
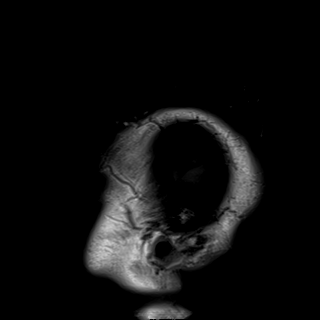
[im 8/24]
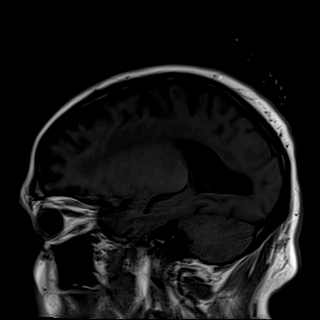
[im 16/24]
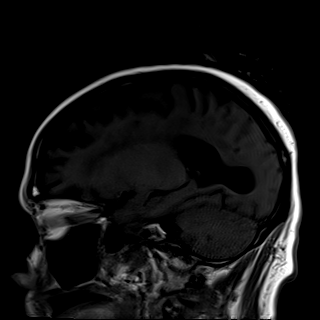
[im 24/24]
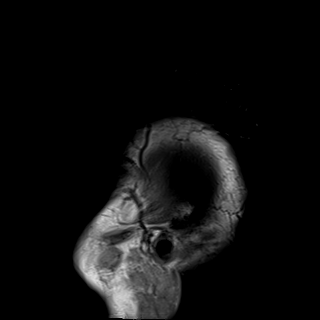

[48 of 48 positions shown; findings below may reference images not displayed]

FINDINGS: Brain: Examination severely degraded by motion artifact.

Diffuse prominence of the CSF containing spaces compatible with
generalized age-related cerebral atrophy. Patchy and confluent
T2/FLAIR hyperintensity within the periventricular and deep white
matter both cerebral hemispheres most consistent with chronic small
vessel ischemic disease, moderate to advanced in nature.
Superimposed remote lacunar infarct present at the left thalamus.

Scattered multifocal foci of restricted diffusion seen involving the
bilateral cerebral and cerebellar hemispheres, consistent with acute
ischemic infarcts. The largest foci within the left cerebral
hemisphere seen within the left occipital lobe in measures
approximately 16 mm (series 5, image 61). Largest focus within the
right cerebral hemisphere position within the right parietal cortex
and measures 11 mm (series 5, image 80). Associated petechial
hemorrhage about this infarct (series 15, image 18). No frank
hemorrhagic transformation. Right greater than left cerebellar
infarcts present, largest of which measures 16 mm on the right
(series 5, image 51). Single punctate subcentimeter infarct noted
involving the superior left thalamus (series 5, image 73). Deep gray
nuclei and brainstem are otherwise spared. No other evidence for
associated hemorrhage. No associated mass effect. Findings likely
due to a central thromboembolic etiology.

No mass lesion, midline shift, or visible mass effect. Ventricles
normal size without hydrocephalus. No extra-axial fluid collection.
Pituitary gland suprasellar region within normal limits. Midline
structures intact and normal.

Vascular: Diminished flow void within the left V4 segment, which
could be related to slow flow and/or occlusion (series 7, image 2).
Major intracranial vascular flow voids otherwise maintained.

Skull and upper cervical spine: Craniocervical junction within
normal limits. Bone marrow signal intensity normal. No scalp soft
tissue abnormality.

Sinuses/Orbits: Globes and orbital soft tissues grossly within
normal limits. Patient status post ocular lens replacement on the
left. Paranasal sinuses are largely clear. Right greater than left
mastoid effusions noted. Inner ear structures grossly normal.

Other: None.
IMPRESSION: 1. Technically limited exam due to extensive motion artifact.
2. Patchy multifocal acute ischemic infarcts involving the bilateral
cerebral and cerebellar hemispheres as above. Given the various
vascular distributions involved, a central thromboembolic etiology
suspected.
3. Associated petechial hemorrhage about 1 of these infarcts at the
right parietal cortex as above. No other evidence for associated
hemorrhage. No frank hemorrhagic transformation or significant mass
effect.
4. Underlying atrophy with moderate chronic microvascular ischemic
disease.

## 2020-11-18 IMAGING — CT CT HEAD W/O CM
4 series · 16 of 47 positions shown, 18 images · non-contrast
Comparison: 05/22/2019.  MRI 05/23/2019

CLINICAL DATA: Follow-up stroke

EXAM:
CT HEAD WITHOUT CONTRAST
TECHNIQUE: Contiguous axial images were obtained from the base of the skull
through the vertex without intravenous contrast.

[Series 3: head wo · axial · 0.49mm/px · z∈[-150,-15]mm · 7 of 37 slices shown, 9 images]
[im 5/37  brain]
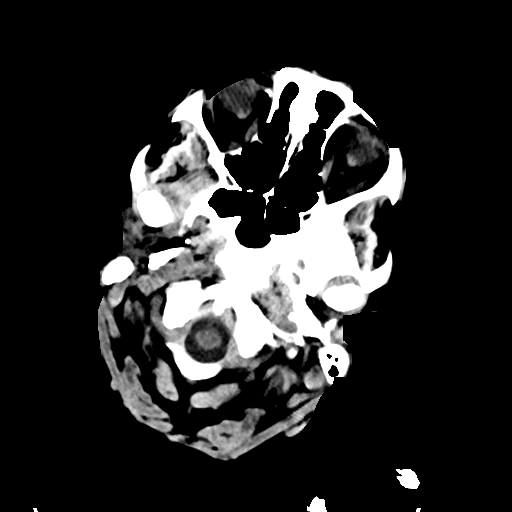
[im 5/37  bone]
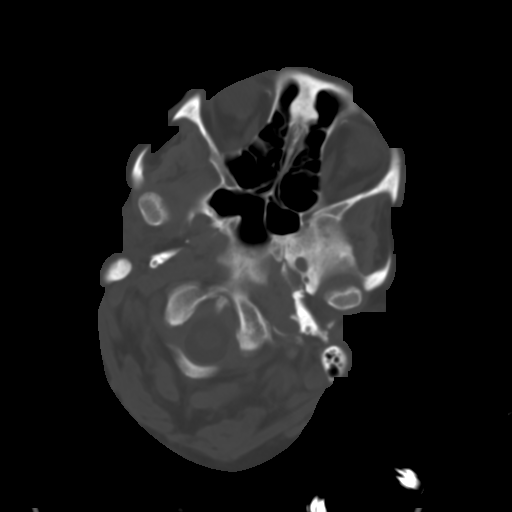
[im 10/37  brain]
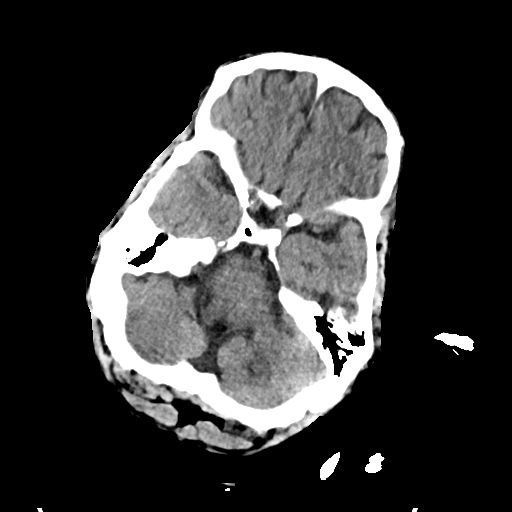
[im 14/37  brain]
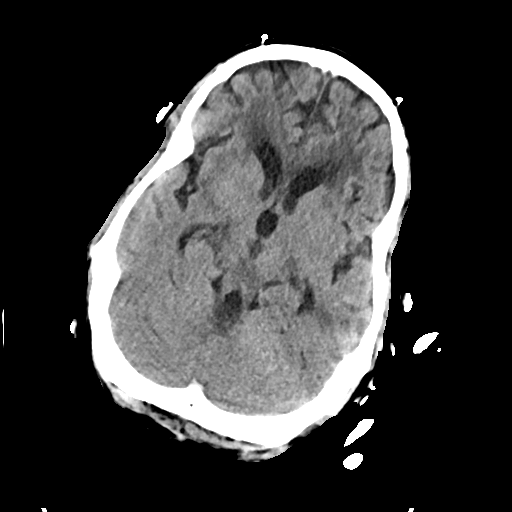
[im 19/37  brain]
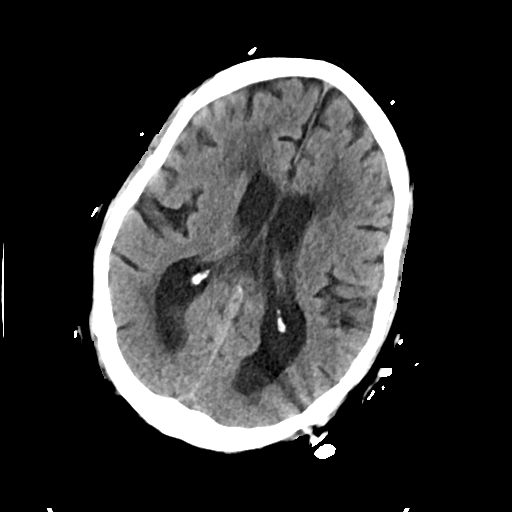
[im 23/37  brain]
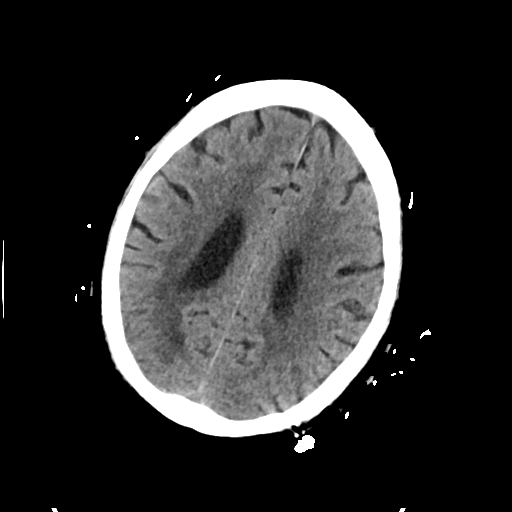
[im 23/37  bone]
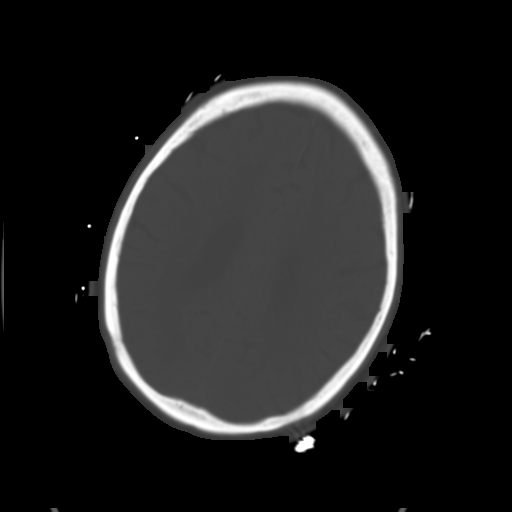
[im 28/37  brain]
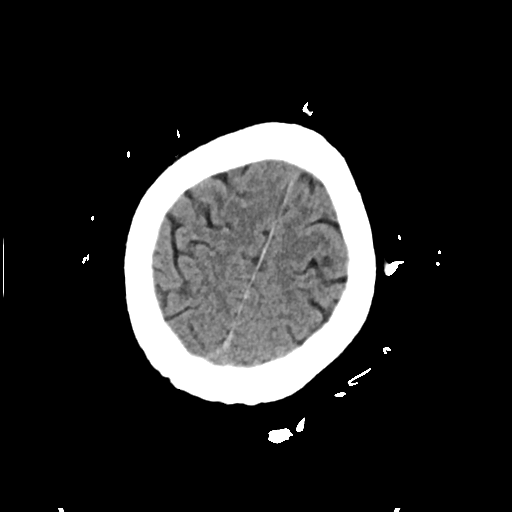
[im 32/37  brain]
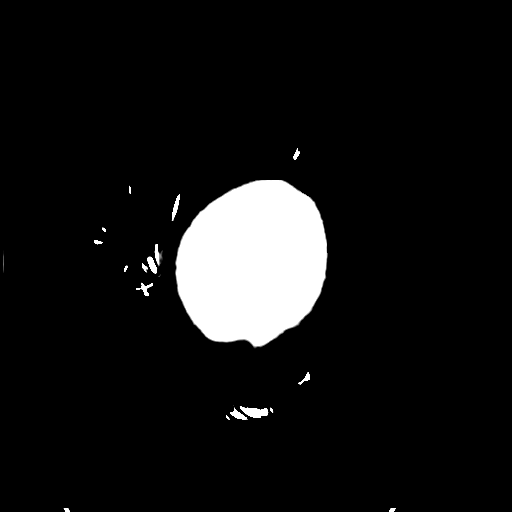

[Series 4: head bone · axial · 0.49mm/px · z∈[-152,-116]mm · 3 of 92 slices shown]
[im 10/92  bone]
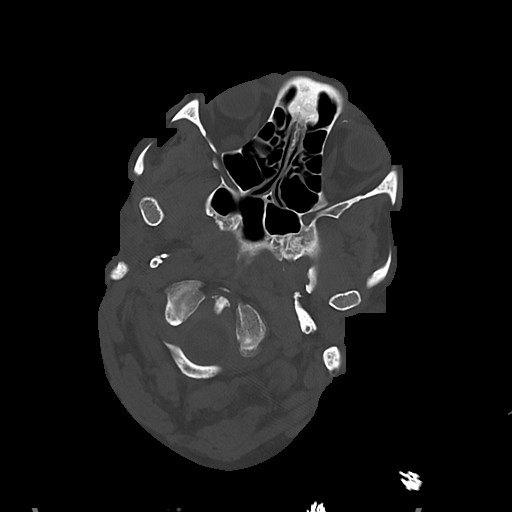
[im 19/92  bone]
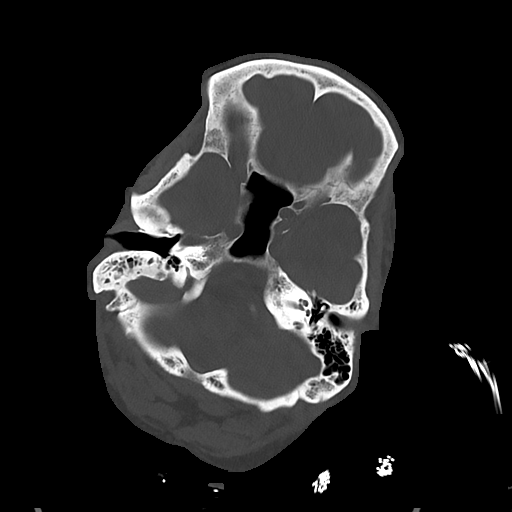
[im 28/92  bone]
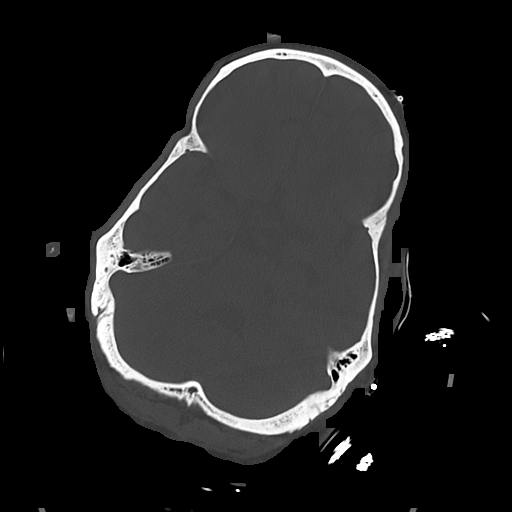

[Series 5: cor soft · coronal · 0.37mm/px · 3 of 81 slices shown]
[im 27/81  brain]
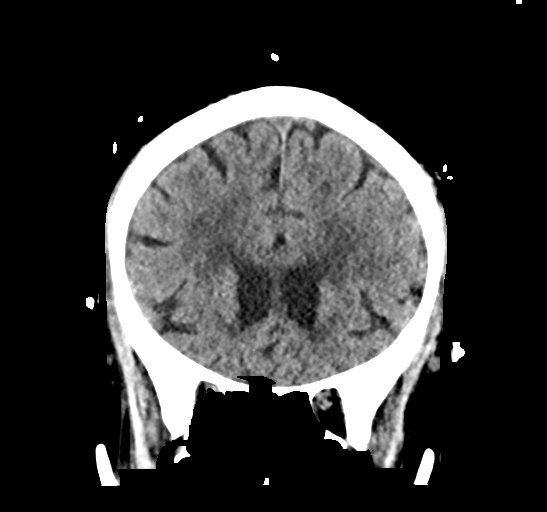
[im 36/81  brain]
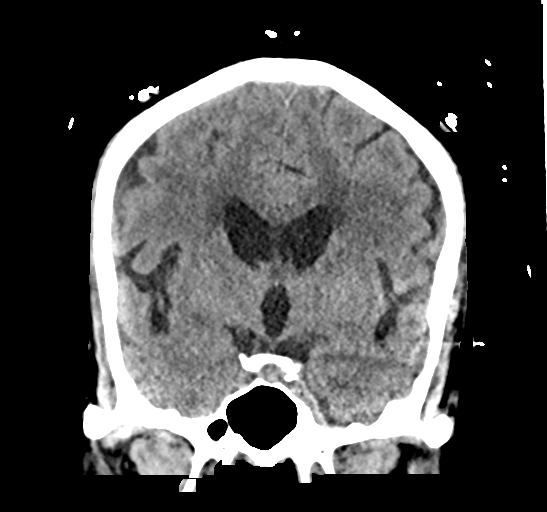
[im 45/81  brain]
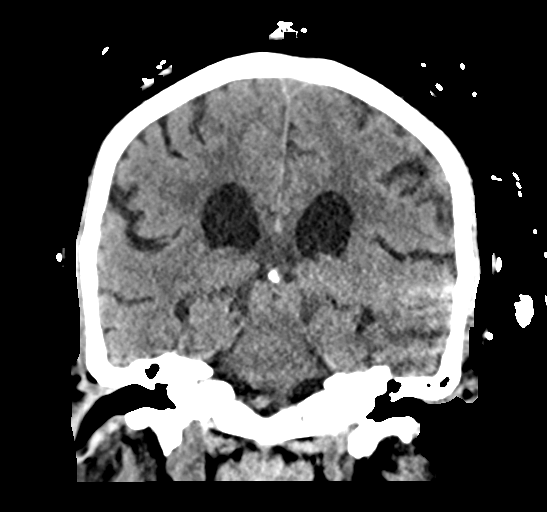

[Series 6: sag soft · sagittal · 0.37mm/px · 3 of 65 slices shown]
[im 22/65  brain]
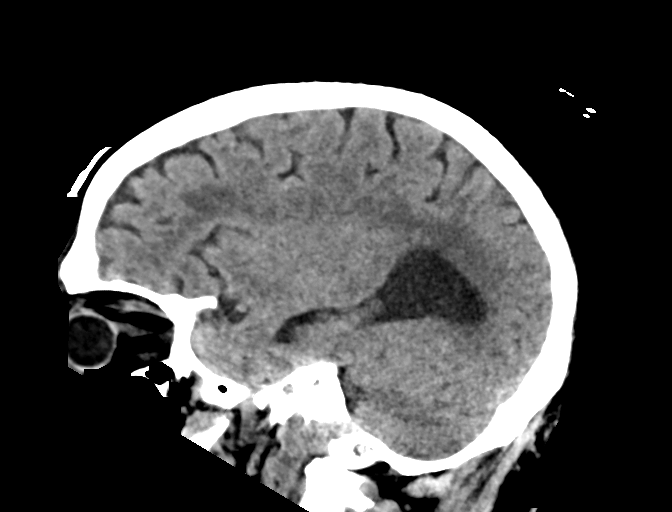
[im 33/65  brain]
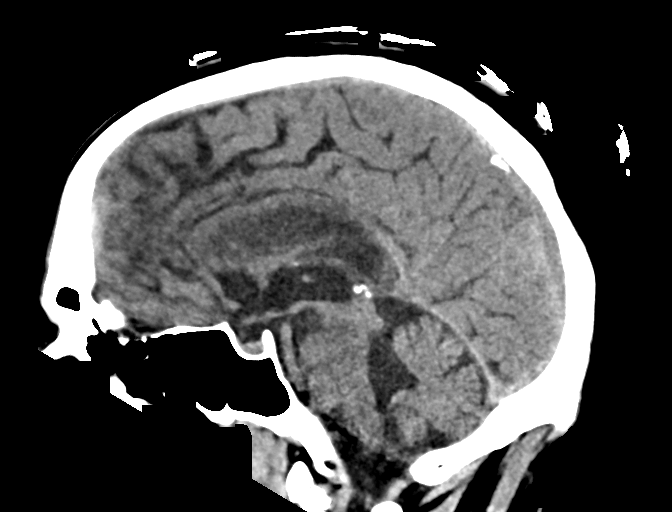
[im 43/65  brain]
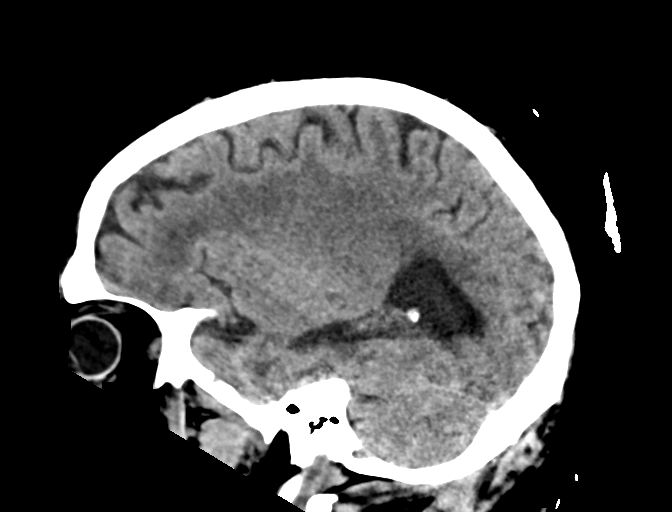

[16 of 47 positions shown; findings below may reference images not displayed]

FINDINGS: Brain: Previously seen areas of acute infarction on MRI not
appreciated by CT. No visible acute infarctions. There is atrophy,
chronic small vessel disease. No hemorrhage or hydrocephalus.

Vascular: No hyperdense vessel or unexpected calcification.

Skull: No acute calvarial abnormality.

Sinuses/Orbits: Visualized paranasal sinuses and mastoids clear.
Orbital soft tissues unremarkable.

Other: None
IMPRESSION: Previous areas of ischemic infarction seen on MRI not appreciated on
CT. No hemorrhage or hydrocephalus.

Atrophy, chronic microvascular disease.

## 2020-11-19 IMAGING — DX DG CHEST 1V PORT
1 series · 1 of 1 positions shown · non-contrast
Comparison: Single-view of the chest 05/23/2019 and 05/20/2019.

CLINICAL DATA: Acute respiratory failure in a patient with
end-stage renal disease. Recent MI.

EXAM:
PORTABLE CHEST 1 VIEW

[chest ap]
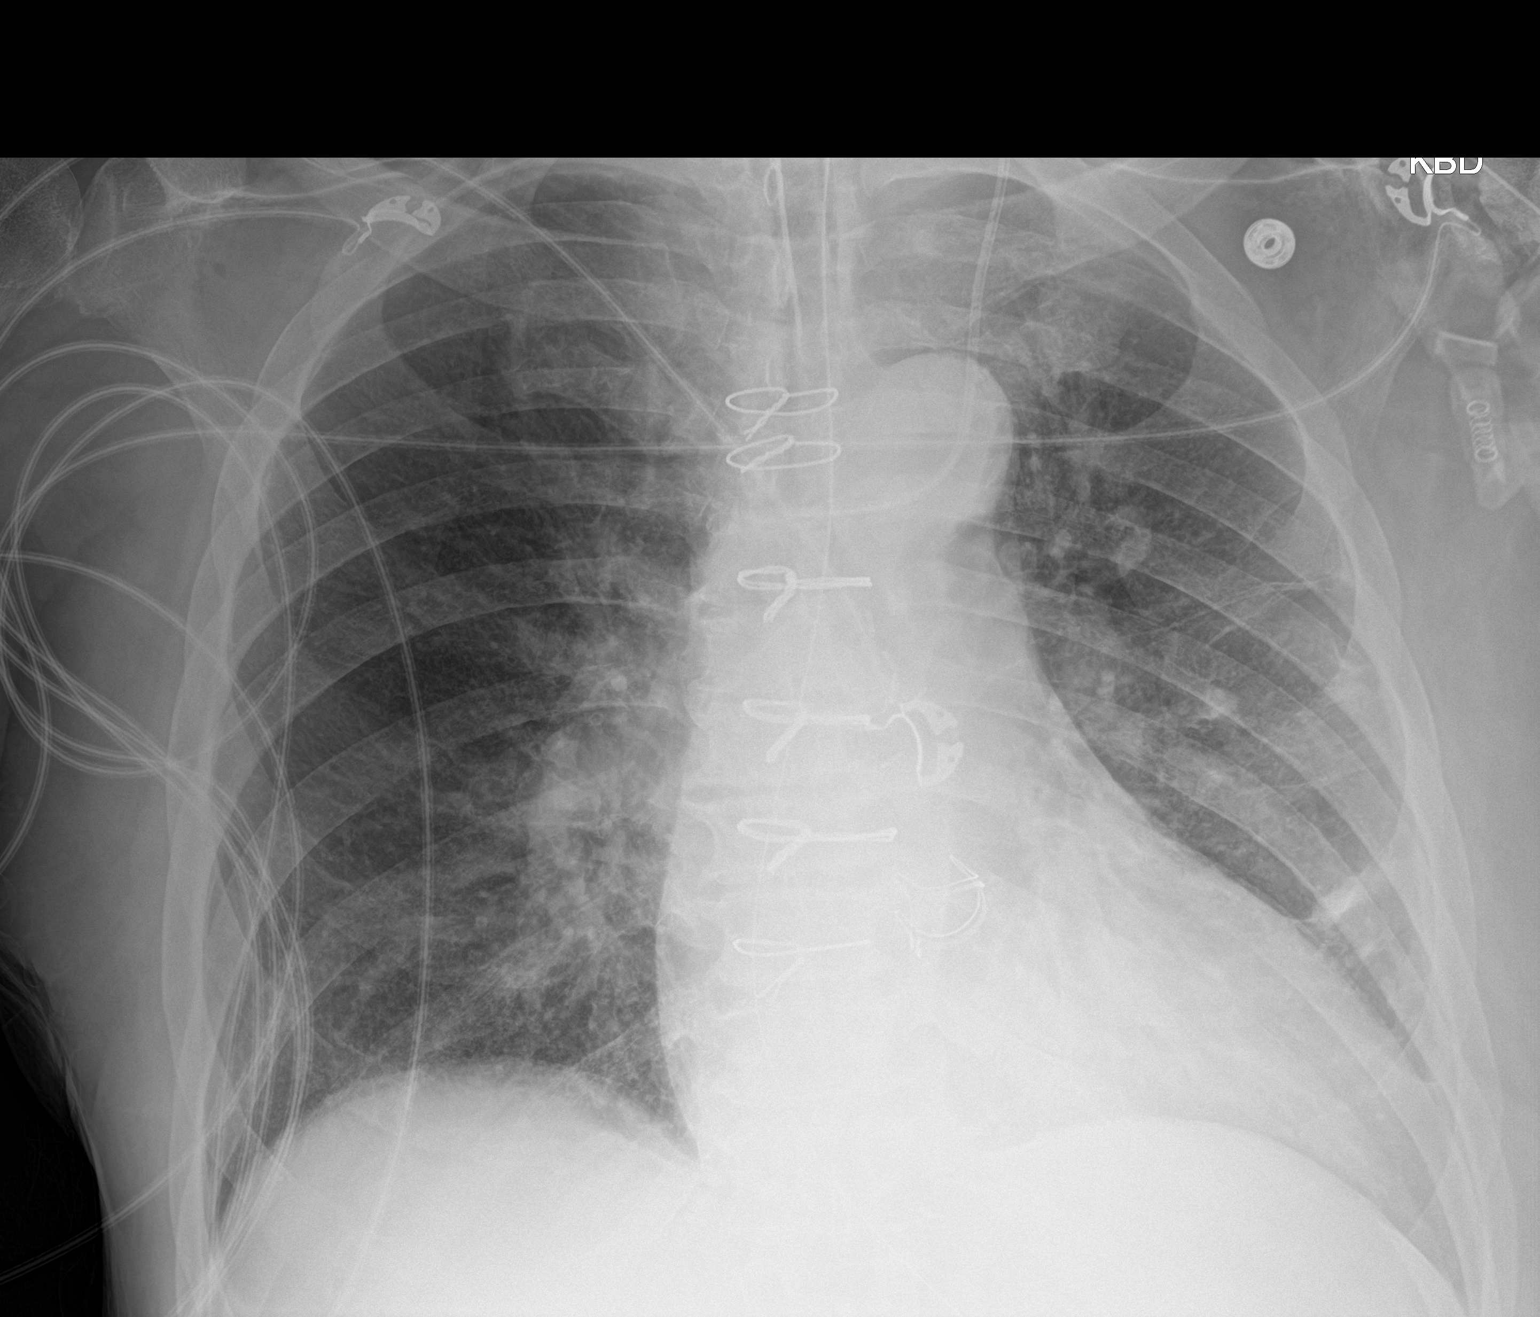

[1 of 1 positions shown; findings below may reference images not displayed]

FINDINGS: Support tubes and lines are unchanged. There is cardiomegaly and
mild vascular congestion, unchanged. No pneumothorax. Aortic
atherosclerosis. The patient is status post aortic valve
replacement.
IMPRESSION: No change in cardiomegaly and vascular congestion.
